# Patient Record
Sex: Female | Born: 1935 | Race: White | Hispanic: No | Marital: Single | Smoking: Former smoker
Health system: Southern US, Community
[De-identification: ages and names within clinical notes are randomized; demographics above are authoritative.]

## PROBLEM LIST (undated history)

## (undated) DIAGNOSIS — F419 Anxiety disorder, unspecified: Secondary | ICD-10-CM

## (undated) DIAGNOSIS — I639 Cerebral infarction, unspecified: Secondary | ICD-10-CM

## (undated) DIAGNOSIS — I8390 Asymptomatic varicose veins of unspecified lower extremity: Secondary | ICD-10-CM

## (undated) DIAGNOSIS — I252 Old myocardial infarction: Secondary | ICD-10-CM

## (undated) DIAGNOSIS — M810 Age-related osteoporosis without current pathological fracture: Secondary | ICD-10-CM

## (undated) DIAGNOSIS — K279 Peptic ulcer, site unspecified, unspecified as acute or chronic, without hemorrhage or perforation: Secondary | ICD-10-CM

## (undated) DIAGNOSIS — L03116 Cellulitis of left lower limb: Secondary | ICD-10-CM

## (undated) DIAGNOSIS — I83009 Varicose veins of unspecified lower extremity with ulcer of unspecified site: Secondary | ICD-10-CM

## (undated) DIAGNOSIS — I872 Venous insufficiency (chronic) (peripheral): Secondary | ICD-10-CM

## (undated) DIAGNOSIS — Z9289 Personal history of other medical treatment: Secondary | ICD-10-CM

## (undated) DIAGNOSIS — G8929 Other chronic pain: Secondary | ICD-10-CM

## (undated) DIAGNOSIS — K5790 Diverticulosis of intestine, part unspecified, without perforation or abscess without bleeding: Secondary | ICD-10-CM

## (undated) DIAGNOSIS — I739 Peripheral vascular disease, unspecified: Secondary | ICD-10-CM

## (undated) DIAGNOSIS — A0811 Acute gastroenteropathy due to Norwalk agent: Secondary | ICD-10-CM

## (undated) DIAGNOSIS — IMO0002 Reserved for concepts with insufficient information to code with codable children: Secondary | ICD-10-CM

## (undated) DIAGNOSIS — E785 Hyperlipidemia, unspecified: Secondary | ICD-10-CM

## (undated) DIAGNOSIS — D509 Iron deficiency anemia, unspecified: Secondary | ICD-10-CM

## (undated) DIAGNOSIS — I5189 Other ill-defined heart diseases: Secondary | ICD-10-CM

## (undated) DIAGNOSIS — L97909 Non-pressure chronic ulcer of unspecified part of unspecified lower leg with unspecified severity: Secondary | ICD-10-CM

## (undated) DIAGNOSIS — K589 Irritable bowel syndrome without diarrhea: Secondary | ICD-10-CM

## (undated) DIAGNOSIS — I509 Heart failure, unspecified: Secondary | ICD-10-CM

## (undated) DIAGNOSIS — K5712 Diverticulitis of small intestine without perforation or abscess without bleeding: Secondary | ICD-10-CM

## (undated) DIAGNOSIS — I824Z1 Acute embolism and thrombosis of unspecified deep veins of right distal lower extremity: Secondary | ICD-10-CM

## (undated) DIAGNOSIS — M549 Dorsalgia, unspecified: Secondary | ICD-10-CM

## (undated) DIAGNOSIS — F32A Depression, unspecified: Secondary | ICD-10-CM

## (undated) DIAGNOSIS — I10 Essential (primary) hypertension: Secondary | ICD-10-CM

## (undated) DIAGNOSIS — I2510 Atherosclerotic heart disease of native coronary artery without angina pectoris: Secondary | ICD-10-CM

## (undated) DIAGNOSIS — F329 Major depressive disorder, single episode, unspecified: Secondary | ICD-10-CM

## (undated) HISTORY — DX: Reserved for concepts with insufficient information to code with codable children: IMO0002

## (undated) HISTORY — DX: Asymptomatic varicose veins of unspecified lower extremity: I83.90

## (undated) HISTORY — PX: HAND SURGERY: SHX662

## (undated) HISTORY — DX: Hyperlipidemia, unspecified: E78.5

## (undated) HISTORY — DX: Acute embolism and thrombosis of unspecified deep veins of right distal lower extremity: I82.4Z1

## (undated) HISTORY — DX: Venous insufficiency (chronic) (peripheral): I87.2

## (undated) HISTORY — DX: Acute gastroenteropathy due to Norwalk agent: A08.11

## (undated) HISTORY — PX: CHOLECYSTECTOMY: SHX55

## (undated) HISTORY — DX: Heart failure, unspecified: I50.9

## (undated) HISTORY — PX: APPENDECTOMY: SHX54

## (undated) HISTORY — DX: Irritable bowel syndrome, unspecified: K58.9

## (undated) HISTORY — PX: OTHER SURGICAL HISTORY: SHX169

## (undated) HISTORY — DX: Iron deficiency anemia, unspecified: D50.9

## (undated) HISTORY — DX: Old myocardial infarction: I25.2

## (undated) HISTORY — DX: Anxiety disorder, unspecified: F41.9

## (undated) HISTORY — DX: Personal history of other medical treatment: Z92.89

## (undated) HISTORY — DX: Diverticulosis of intestine, part unspecified, without perforation or abscess without bleeding: K57.90

## (undated) HISTORY — DX: Varicose veins of unspecified lower extremity with ulcer of unspecified site: I83.009

## (undated) HISTORY — DX: Depression, unspecified: F32.A

## (undated) HISTORY — DX: Peptic ulcer, site unspecified, unspecified as acute or chronic, without hemorrhage or perforation: K27.9

## (undated) HISTORY — DX: Non-pressure chronic ulcer of unspecified part of unspecified lower leg with unspecified severity: L97.909

## (undated) HISTORY — DX: Peripheral vascular disease, unspecified: I73.9

## (undated) HISTORY — DX: Essential (primary) hypertension: I10

## (undated) HISTORY — DX: Major depressive disorder, single episode, unspecified: F32.9

## (undated) HISTORY — DX: Other ill-defined heart diseases: I51.89

## (undated) HISTORY — DX: Dorsalgia, unspecified: M54.9

## (undated) HISTORY — DX: Atherosclerotic heart disease of native coronary artery without angina pectoris: I25.10

## (undated) HISTORY — PX: TYMPANOPLASTY: SHX33

## (undated) HISTORY — DX: Other chronic pain: G89.29

## (undated) HISTORY — DX: Cellulitis of left lower limb: L03.116

---
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
## 1973-04-04 HISTORY — PX: ABDOMINAL HYSTERECTOMY: SHX81

## 1996-04-04 HISTORY — PX: CORONARY ARTERY BYPASS GRAFT: SHX141

## 1998-10-27 ENCOUNTER — Encounter: Admission: RE | Admit: 1998-10-27 | Discharge: 1998-11-02 | Payer: Self-pay | Admitting: Orthopedic Surgery

## 1998-11-03 ENCOUNTER — Encounter: Admission: RE | Admit: 1998-11-03 | Discharge: 1998-12-15 | Payer: Self-pay | Admitting: Orthopedic Surgery

## 2000-06-09 ENCOUNTER — Inpatient Hospital Stay (HOSPITAL_COMMUNITY): Admission: EM | Admit: 2000-06-09 | Discharge: 2000-06-13 | Payer: Self-pay | Admitting: Emergency Medicine

## 2000-06-12 ENCOUNTER — Encounter: Payer: Self-pay | Admitting: Endocrinology

## 2003-12-15 ENCOUNTER — Inpatient Hospital Stay (HOSPITAL_BASED_OUTPATIENT_CLINIC_OR_DEPARTMENT_OTHER): Admission: RE | Admit: 2003-12-15 | Discharge: 2003-12-15 | Payer: Self-pay | Admitting: Cardiovascular Disease

## 2007-04-05 HISTORY — PX: OTHER SURGICAL HISTORY: SHX169

## 2008-12-02 ENCOUNTER — Encounter: Admission: RE | Admit: 2008-12-02 | Discharge: 2008-12-02 | Payer: Self-pay | Admitting: Endocrinology

## 2009-01-14 ENCOUNTER — Encounter: Admission: RE | Admit: 2009-01-14 | Discharge: 2009-01-14 | Payer: Self-pay | Admitting: Orthopedic Surgery

## 2009-01-15 ENCOUNTER — Ambulatory Visit (HOSPITAL_BASED_OUTPATIENT_CLINIC_OR_DEPARTMENT_OTHER): Admission: RE | Admit: 2009-01-15 | Discharge: 2009-01-15 | Payer: Self-pay | Admitting: Orthopedic Surgery

## 2009-11-24 ENCOUNTER — Ambulatory Visit: Payer: Self-pay | Admitting: Cardiovascular Disease

## 2010-02-02 HISTORY — PX: COLONOSCOPY: SHX174

## 2010-02-09 ENCOUNTER — Ambulatory Visit: Payer: Self-pay | Admitting: Cardiovascular Disease

## 2010-03-15 ENCOUNTER — Encounter: Payer: Self-pay | Admitting: Cardiovascular Disease

## 2010-03-15 DIAGNOSIS — E669 Obesity, unspecified: Secondary | ICD-10-CM

## 2010-03-15 DIAGNOSIS — I10 Essential (primary) hypertension: Secondary | ICD-10-CM

## 2010-03-15 DIAGNOSIS — F419 Anxiety disorder, unspecified: Secondary | ICD-10-CM

## 2010-03-15 DIAGNOSIS — E1159 Type 2 diabetes mellitus with other circulatory complications: Secondary | ICD-10-CM | POA: Insufficient documentation

## 2010-03-15 DIAGNOSIS — F411 Generalized anxiety disorder: Secondary | ICD-10-CM | POA: Insufficient documentation

## 2010-03-15 DIAGNOSIS — E66811 Obesity, class 1: Secondary | ICD-10-CM | POA: Insufficient documentation

## 2010-05-11 ENCOUNTER — Other Ambulatory Visit: Payer: Self-pay | Admitting: Endocrinology

## 2010-05-11 DIAGNOSIS — R269 Unspecified abnormalities of gait and mobility: Secondary | ICD-10-CM

## 2010-05-11 DIAGNOSIS — H538 Other visual disturbances: Secondary | ICD-10-CM

## 2010-05-11 DIAGNOSIS — R928 Other abnormal and inconclusive findings on diagnostic imaging of breast: Secondary | ICD-10-CM

## 2010-05-11 DIAGNOSIS — R531 Weakness: Secondary | ICD-10-CM

## 2010-05-13 ENCOUNTER — Ambulatory Visit
Admission: RE | Admit: 2010-05-13 | Discharge: 2010-05-13 | Disposition: A | Discharge status: Home / Self Care | Payer: Medicare Other | Source: Ambulatory Visit | Attending: Endocrinology | Admitting: Endocrinology

## 2010-05-13 DIAGNOSIS — R531 Weakness: Secondary | ICD-10-CM

## 2010-05-13 DIAGNOSIS — R269 Unspecified abnormalities of gait and mobility: Secondary | ICD-10-CM

## 2010-05-13 DIAGNOSIS — H538 Other visual disturbances: Secondary | ICD-10-CM

## 2010-05-21 ENCOUNTER — Ambulatory Visit
Admission: RE | Admit: 2010-05-21 | Discharge: 2010-05-21 | Disposition: A | Discharge status: Home / Self Care | Payer: Medicare Other | Source: Ambulatory Visit | Attending: Endocrinology | Admitting: Endocrinology

## 2010-05-21 DIAGNOSIS — R928 Other abnormal and inconclusive findings on diagnostic imaging of breast: Secondary | ICD-10-CM

## 2010-06-01 ENCOUNTER — Ambulatory Visit: Payer: Self-pay | Admitting: Cardiovascular Disease

## 2010-07-08 LAB — BASIC METABOLIC PANEL
BUN: 25 mg/dL — ABNORMAL HIGH (ref 6–23)
GFR calc Af Amer: >60 mL/min (ref >60)
GFR calc non Af Amer: 53 mL/min — ABNORMAL LOW (ref >60)
Potassium: 5.2 mEq/L — ABNORMAL HIGH (ref 3.5–5.1)
Sodium: 136 mEq/L (ref 135–145)

## 2010-07-08 LAB — GLUCOSE, CAPILLARY: Glucose-Capillary: 116 mg/dL — ABNORMAL HIGH (ref 70–99)

## 2010-08-20 NOTE — Cardiovascular Report (Signed)
[name], [name]                        ACCOUNT NO.:  [id number]   MEDICAL RECORD NO.:  [id number]                   PATIENT TYPE:  OIB   LOCATION:  6501                                 FACILITY:  [facility]   PHYSICIAN:  Thayer Headings, M.D.              DATE OF BIRTH:  09/02/1927   DATE OF PROCEDURE:  12/15/2003  DATE OF DISCHARGE:                              CARDIAC CATHETERIZATION   [name] is a 77 year old female.  She has a history of coronary artery  bypass grafting approximately 9 years ago.  She recently has had some  episodes of chest pain and shortness of breath.  She had a stress Cardiolite  study which revealed anterior apical attenuation.  She was referred for  heart catheterization for further evaluation.   PROCEDURE:  Left heart catheterization with coronary angiography.   The right femoral artery was easily cannulated using the modified Seldinger  technique.   HEMODYNAMICS:  The left ventricular pressure was 149/11 with an aortic  pressure of 149/66.   CORONARY ANGIOGRAPHY:  1.  Left main coronary artery has mild irregularities.  The proximal LAD has      moderate irregularities in the proximal segment between 40 and 50%.  It      gives off a moderate to large first diagonal branch and is then      subtotally occluded.  There is a very slow flow going down to the distal      LAD and competitive flow can be seen from the IMA.  2.  The left circumflex artery is a moderate size vessel.  The first obtuse      marginal artery is occluded.  The circumflex artery terminates as a      moderate size posterior lateral branch.  3.  The right coronary artery is large and dominant.  There is a proximal      50% stenosis.  In the mid segment there are diffuse irregularities,      between 20 and 30% stenosis.  The distal RCA has a 90% stenosis.      Competitive flow can be seen from the saphenous vein graft.  The distal      right coronary artery/posterior descending  artery is unremarkable.  4.  The saphenous vein graft to the right coronary artery is fairly smooth      and normal.  The anastomosis is normal.  The posterior descending artery      can be seen well during this shot into this seen to be normal.  5.  The saphenous vein graft to the first obtuse marginal artery is normal.      The anastomosis is normal.  The obtuse marginal branch is normal.  6.  The left internal mammary artery is fairly large and normal.  It fills      the LAD is a normal fashion.  There is no significant stenoses in the  mid and distal LAD.   LEFT VENTRICULOGRAM:  Left ventriculogram was performed in a 30-RAO  position.  It reveals overall well preserved left ventricular systolic  function with an ejection fraction of 60-65%.   COMPLICATIONS:  None.   CONCLUSIONS:  1.  Severe native coronary artery disease, but with patent grafts and      internal mammary artery.  2.  Normal left ventricular systolic function.   It appears that her symptoms of chest pain and shortness of breath are not  related to coronary artery disease.  We will continue to have her look for  other causes.  She may need to check in with her medical doctor.                                               Thayer Headings, M.D.    PJN/MEDQ  D:  12/15/2003  T:  12/15/2003  Job:  [id number]   cc:   Parke Poisson. Electa Sniff, M.D.  [id number] [address], [address] [place]  [place] [address]  Fax: [phone]

## 2010-08-20 NOTE — H&P (Signed)
[place]  Patient:    [name], [name]                    MRN: [id number] Adm. Date:  06/09/00 Attending:  Parke Poisson. Electa Sniff, M.D.                         History and Physical  CHIEF COMPLAINT:  This is a 77 year old woman, who complains of shortness of breath.  HISTORY OF PRESENT ILLNESS:  The patient has had a cough for a period of approximately three to five days prior to this admission.  She has become progressively more short of breath and presents to the office with symptoms of severe dyspnea and also anxiety.  She is markedly distraught with being so dyspneic.  There is no history of chest pain.  She has not been coughing up anything productive.  She does have a history of coronary artery disease and had a coronary artery bypass graft done in August of 1996 and denies any chest pain at the present time.  She has not had any fever or chills.  She had an electrocardiogram in the office which is negative and a chest x-ray which did not show any changes.  She has a history of diabetes mellitus, type 2 which has been present since 1991 and she is currently receiving Actos for this and has been under good control recently.  She has a history of degenerative joint disease, hypertension and a history of dyslipidemia.  PAST MEDICAL HISTORY:  She has had a hospitalization in the past for hysterectomy and bilateral salpingo-oophorectomy.  In 1978 she had [facility] spotted fever.  In 1979 and 1989 and again in 1989, she was admitted to the hospital for diverticulitis.  In September of 1989 she was admitted to the hospital for gastroenteritis.  She was hospitalization in 1996 and at that time she had her coronary artery bypass graft.  MEDICATIONS PRIOR TO ADMISSION:  1. Xanax 1 mg t.i.d.  2. Desyrel 150 mg h.s.  3. Estratab 0.625 mg q.d.  4. Prilosec 40 mg q.d.  5. Tenormin 25 mg q.d.  6. Enteric-coated aspirin 81 mg q.d.  7.  Pravachol 20 mg h.s.  8. Actos 45 mg q.d.  9. Altace 10 mg q.d. 10. Antivert p.r.n.  FAMILY HISTORY:  Her mother died in 65 of a stroke.  Father died in 45 of diabetes and arthritis.  She has one brother and one sister who has diabetes. She is divorced and has four children.  PERSONAL HISTORY:  She does not smoke or drink excessive amounts of alcohol.  ALLERGIES:  She is allergic to VOLTAREN which causes possible abnormal liver function studies.  REVIEW OF SYSTEMS:  She has had no history of weight loss. CARDIOVASCULAR/RESPIRATORY:  See above.  GASTROINTESTINAL:  She has a history of multiple problems with her colon with irritable colon syndrome but no recent symptoms at this time.  GENITOURINARY:  No complaints.  PHYSICAL EXAMINATION:  GENERAL:  This is a well-developed woman, who appears distraught, anxious and is markedly dyspneic.  SKIN:  Warm.  She has marked facial flushing.  HEAD:  Her head is normocephalic.  NECK:  Supple.  LUNGS:  Reveal diffuse wheezing to be present bilaterally.  Rales are present bilaterally.  CARDIOVASCULAR:  Rhythm is regular.  BREASTS:  No masses are present.  ABDOMEN:  Soft.  No masses are present.  EXTREMITIES:  No edema  is present.  NEUROMUSCULAR:  Essentially normal.  IMPRESSION: 1. Acute bronchitis with severe bronchospasm and dyspnea. 2. History of atherosclerotic heart disease as manifested by previous    coronary artery bypass graft, possible congestive heart failure. 3. Diabetes mellitus, type 2. 4. History of irritable colon syndrome. 5. Dyslipidemia. DD:  06/09/00 TD:  06/11/00 Job: [id number]

## 2010-08-20 NOTE — Discharge Summary (Signed)
[place]  Patient:    [name], [name]                     MRN: [id number] Adm. Date:  [id number] Disc. Date: [id number] Attending:  Sherrlyn Hock                           Discharge Summary  HISTORY:  This is a 77 year old woman who presents with a history of dyspnea. She has had a cough for approximately three to five days prior to this admission; and, she presents to the office very distraught and markedly symptomatic with reference to dyspnea.  She has had a persistent cough and is markedly stressed.  She does have a history of coronary artery disease and had a bypass graft done in 1996; however, she denies any chest pain at this time. In the office she had an x-ray, which did not show any changes.  No evidence of heart failure is present; however, some mild pulmonary hypertension changes are present.  She has a history of diabetes mellitus, which has been controlled with Actos and also has a history of hypertension, degenerative joint disease and dyslipidemia.  PHYSICAL EXAMINATION:  GENERAL APPEARANCE:  Reveals a well-developed woman who appears markedly anxious and in distress.  LUNGS:  Reveal wheezes to be present bilaterally.  HEART:  Cardiovascular rhythm is regular.  ABDOMEN:  Soft.  EXTREMITIES:  Do not show any pedal edema.  The impression on admission is: 1. Acute bronchitis with severe bronchospasm and dyspnea. 2. History of arteriosclerotic heard disease as manifested by coronary artery    bypass graft. 3. Diabetes mellitus Type II. 4. History of irritable colon syndrome. 5. Dyslipidemia.  HOSPITAL COURSE:  The patient was admitted to the hospital and arterial gases were obtained.  Her pO2 was 65 and her pCO2 was 45.  A CBC was done prior to her admission and this did not show an elevated white count.  An electrocardiogram was also done prior to the admission, which did not show any significant  changes.  She was slow to improve.  She continued to have moderate wheezes to be present.  However, clinically her objective findings were not as significant as her subjective findings.  She gradually improved.  Repeat chest x-ray was done, which did not show any significant changes.  Her glucose was controlled.  She was switched from IV medications to oral medications and was started on Flovent.  She still feels weak at this time and is anxious.  However, examination of her lungs reveal her wheezing to be markedly improved.  There is some mild dyspnea present.  However, objectively the patient is significantly better.  A cardiac echo is pending and that will be done either as an outpatient or done prior to her departure from the hospital.  IMPRESSION ON DISCHARGE: 1. Acute bronchitis with severe bronchospasm. 2. Diabetes mellitus Type II. 3. Arteriosclerotic heart disease. 4. Dyslipidemia. 5. Hypertension.  MEDICATIONS ON DISCHARGE:  Xanax 0.5 mg t.i.d. p.r.n., Desyrel 150 mg H.S., Estratab 0.625 mg q.d., Prilosec 40 mg q.d., Tenormin 25 mg q.d., enteric coated aspirin 81 mg q.d., Pravachol 20 mg q.d., Actos 45 mg q.d., Altace 20 mg q.d., and added to her program is Tequin 400 mg p.o. q.d., Flovent 44 2 puffs in the morning and 2 in the evening, Ventolin 2 puffs q.i.d., and Maxzide 25 1 q.a.m.  DIET ON DISCHARGE:  Diabetic  diet.  ACTIVITIES ON DISCHARGE:  She was advised to to get rest.  PLANS FOR FOLLOW-UP:  She will be seen in the office on Monday.  CONDITION ON DISCHARGE:  Improved. DD:  06/13/00 TD:  06/14/00 Job: [id number]

## 2010-08-20 NOTE — H&P (Signed)
NAME:  [name], [name]                        ACCOUNT NO.:  [id number]   MEDICAL RECORD NO.:  [id number]                   PATIENT TYPE:  AMB   LOCATION:                                       FACILITY:  [facility]   PHYSICIAN:  Thayer Headings, M.D.              DATE OF BIRTH:  [birth year]   DATE OF ADMISSION:  12/15/2003  DATE OF DISCHARGE:                                HISTORY & PHYSICAL   HISTORY OF PRESENT ILLNESS:  The patient is an elderly female with a history  of coronary artery disease. She has had some progressive shortness of breath  and chest discomfort over the past several months.  She recently had a  stress Cardiolite study which had some anteroapical abnormalities.  There  appeared to be reversible defects.  I could not exclude breast attenuation.  Because of her symptoms, she is referred for a heart catheterization.  She  continues to have dyspnea on exertion and some chest discomfort, especially  when she gets in a hurry.  She has a history of coronary artery disease, and  is status post coronary artery bypass graft surgery approximately nine years  ago.   CURRENT MEDICATIONS:  1.  Estrace once daily.  2.  Xanax t.i.d.  3.  Desyrel 150 mg daily.  4.  Prilosec 20 mg daily.  5.  Atenolol 25 mg daily.  6.  Pravachol once daily.  7.  Enteric-coated aspirin 81 mg daily.  8.  Altace 5 mg daily.  9.  Nitroglycerin p.r.n.   ALLERGIES:  No known drug allergies.   PAST MEDICAL HISTORY:  1.  Hypercholesterolemia.  2.  Hypertension.  3.  Coronary artery disease, status post coronary artery bypass graft      surgery.  4.  Peptic ulcer disease.   SOCIAL HISTORY:  The patient does not smoke and does not drink.  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   FAMILY HISTORY:  Unremarkable.   REVIEW OF SYSTEMS:  Was reviewed and is essentially negative.   PHYSICAL EXAMINATION:  GENERAL:  She is an elderly female in no acute  distress.  Weight 166 pounds, which is  down 2 pounds from her previous  visit.  VITAL SIGNS:  Blood pressure 168/90, heart rate 60.  HEENT/NECK:  Reveals 2+ carotids.  She has no bruits.  There is no jugular  venous distention.  No thyromegaly.  LUNGS:  Clear to auscultation.  HEART:  A regular rate.  S1, S2 with no murmurs, gallops, or rubs.  ABDOMEN:  Good bowel sounds and nontender.  EXTREMITIES:  She has no clubbing, cyanosis or edema.  NEUROLOGIC:  Nonfocal.   IMPRESSION:  The patient presents with persistent episodes of chest pain and  shortness of breath.   PLAN:  I have recommended that we proceed with a heart catheterization.  We  have discussed the risks, benefits and options of  heart catheterization.  She understands and agrees to proceed.                                                Thayer Headings, M.D.    PJN/MEDQ  D:  12/11/2003  T:  12/11/2003  Job:  [id number]   cc:   Parke Poisson. Electa Sniff, M.D.  [id number] [address] [place]  [place] [address]  Fax: [phone]

## 2010-09-06 ENCOUNTER — Ambulatory Visit (INDEPENDENT_AMBULATORY_CARE_PROVIDER_SITE_OTHER): Payer: Medicare Other | Admitting: Family Medicine

## 2010-09-06 ENCOUNTER — Encounter: Payer: Self-pay | Admitting: Family Medicine

## 2010-09-06 ENCOUNTER — Other Ambulatory Visit: Payer: Self-pay | Admitting: *Deleted

## 2010-09-06 DIAGNOSIS — E1169 Type 2 diabetes mellitus with other specified complication: Secondary | ICD-10-CM | POA: Insufficient documentation

## 2010-09-06 DIAGNOSIS — D509 Iron deficiency anemia, unspecified: Secondary | ICD-10-CM | POA: Insufficient documentation

## 2010-09-06 DIAGNOSIS — E785 Hyperlipidemia, unspecified: Secondary | ICD-10-CM | POA: Insufficient documentation

## 2010-09-06 DIAGNOSIS — H6120 Impacted cerumen, unspecified ear: Secondary | ICD-10-CM

## 2010-09-06 DIAGNOSIS — D649 Anemia, unspecified: Secondary | ICD-10-CM

## 2010-09-06 DIAGNOSIS — E119 Type 2 diabetes mellitus without complications: Secondary | ICD-10-CM

## 2010-09-06 DIAGNOSIS — I2510 Atherosclerotic heart disease of native coronary artery without angina pectoris: Secondary | ICD-10-CM

## 2010-09-06 DIAGNOSIS — I10 Essential (primary) hypertension: Secondary | ICD-10-CM

## 2010-09-06 DIAGNOSIS — Z23 Encounter for immunization: Secondary | ICD-10-CM

## 2010-09-06 DIAGNOSIS — E1165 Type 2 diabetes mellitus with hyperglycemia: Secondary | ICD-10-CM | POA: Insufficient documentation

## 2010-09-06 MED ORDER — PRAVASTATIN SODIUM 20 MG PO TABS: 20.0000 mg | ORAL_TABLET | Freq: Every day | ORAL | Status: DC

## 2010-09-06 MED ORDER — ESOMEPRAZOLE MAGNESIUM 40 MG PO CPDR: 40.0000 mg | DELAYED_RELEASE_CAPSULE | Freq: Every day | ORAL | Status: DC

## 2010-09-06 MED ORDER — ACETIC ACID 2 % OT SOLN: 4.0000 [drp] | Freq: Three times a day (TID) | OTIC | Status: DC

## 2010-09-06 NOTE — Assessment & Plan Note (Signed)
Unchanged. Refilled pravastatin. Awaiting records, per pt just had labs drawn.

## 2010-09-06 NOTE — Assessment & Plan Note (Signed)
Stable. Continue current meds.   

## 2010-09-06 NOTE — Patient Instructions (Addendum)
Great to meet you. As soon as I get your records, I will let you know.

## 2010-09-06 NOTE — Progress Notes (Signed)
77 yo female here to establish care.  DM- followed by Endocrine, Dr. Dwyane Dee. Per pt, controlled on Actos 45 mg daily, awaiting records. Denies any episodes of hypoglycemia.     HTN- well controlled on Maxzide 25 daily. Denies any CP, SOB or LE edema.    CAD- followed by Dr. Johnsie Cancel. S/p CABG8/199, Denies CP. Last echo in 2002 showed EF of 65%.  Right ear pain- feels full.  No drainage, fevers, or URI symptoms.    The PMH, PSH, Social History, Family History, Medications, and allergies have been reviewed in [facility], and have been updated if relevant.  ROS: See HPI  PHYSICAL EXAMINATION:  BP 118/64  Pulse 90  Temp(Src) 97.9 F (36.6 C) (Oral)  Ht 4' 9.5" (1.461 m)  Wt 156 lb 12.8 oz (71.124 kg)  BMI 33.34 kg/m2  GENERAL APPEARANCE: Reveals a well-developed woman who appears markedly  anxious and in distress. HEENT:  Cerumen impaction bilaterally, unable to remove with irrigation LUNGS:CTA bilaterally HEART: Cardiovascular rhythm is regular.  ABDOMEN: Soft.  EXTREMITIES: Do not show any pedal edema.

## 2010-09-06 NOTE — Assessment & Plan Note (Signed)
New.  Attempted irrigation, able to remove small chunks but not large amounts of wax bilaterally. Recommended OTC Debrox solution.

## 2010-09-13 ENCOUNTER — Telehealth: Payer: Self-pay | Admitting: *Deleted

## 2010-09-13 NOTE — Telephone Encounter (Signed)
Patient was seen last week and she says that the drops that she is using for her ear isn't helping at all. She is asking what to do at this point. Please advise. Uses [facility].

## 2010-09-13 NOTE — Telephone Encounter (Signed)
Patient notified, appt scheduled.

## 2010-09-13 NOTE — Telephone Encounter (Signed)
The only other possibility would to have her come back in again to try to remove her wax in office. Maybe drops have softened the wax a Rister to make it easier to do in office.

## 2010-09-16 ENCOUNTER — Ambulatory Visit (INDEPENDENT_AMBULATORY_CARE_PROVIDER_SITE_OTHER): Payer: Medicare Other | Admitting: Family Medicine

## 2010-09-16 ENCOUNTER — Encounter: Payer: Self-pay | Admitting: Family Medicine

## 2010-09-16 VITALS — Ht <= 58 in

## 2010-09-16 DIAGNOSIS — H6120 Impacted cerumen, unspecified ear: Secondary | ICD-10-CM

## 2010-09-16 NOTE — Progress Notes (Signed)
  Subjective:    [name], female    DOB: 02-23-1928, 77 y.o.   MRN: [id number]  HPI Here for cerumen impaction. Tried debrox OTC drops with no improvement.   Review of Systems     Objective:   Physical Exam        Assessment & Plan:  Cerumen removed with irrigation and curette. Tolerated procedure well.

## 2010-09-21 ENCOUNTER — Ambulatory Visit (INDEPENDENT_AMBULATORY_CARE_PROVIDER_SITE_OTHER): Payer: Medicare Other | Admitting: Family Medicine

## 2010-09-21 ENCOUNTER — Ambulatory Visit: Payer: Medicare Other | Admitting: Family Medicine

## 2010-09-21 ENCOUNTER — Encounter: Payer: Self-pay | Admitting: Family Medicine

## 2010-09-21 VITALS — BP 120/80 | HR 90 | Temp 98.3°F | Wt 156.0 lb

## 2010-09-21 DIAGNOSIS — H6120 Impacted cerumen, unspecified ear: Secondary | ICD-10-CM

## 2010-09-21 NOTE — Progress Notes (Signed)
Subjective:    [name], female    DOB: [birth year], 77 y.o.   MRN: [id number]  HPI CC: check R ear.  Pleasant 77yo new to me with complaint of R cerumen impaction.  3rd time here.  Ear has been bothering her for several weeks now.  Some pain.  Denies drainage/discharge, tinnitus.  Endorses slight sinus congestion and RN.  Some rhinorrhea for a while now.  Denies fevers/chills, popping of ear.  6/4 attempted cerumen disimpaction, unable to.  Recommended debrox.  Ear flushed out on Thursday 09/16/10, got some ear wax out.  Still feels strange, some pain and stopped up behind ear.  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 No prior problems with right ear.  Patient Active Problem List  Diagnoses  . Coronary artery disease  . Anxiety  . Obesity  . Hypertension  . Diastolic dysfunction  . Anemia  . Diabetes mellitus  . Hyperlipidemia  . Cerumen impaction   Past Medical History  Diagnosis Date  . Hypertension   . Coronary artery disease   . Anxiety   . Diastolic dysfunction   . Hyperlipidemia   . Peptic ulcer disease   . Anemia   . Depression    Past Surgical History  Procedure Date  . Vaginal hysterectomy   . Tympanoplasty 1960s    R side  . Coronary artery bypass graft 1998   History  Substance Use Topics  . Smoking status: Never Smoker   . Smokeless tobacco: Not on file  . Alcohol Use: Not on file   Family History  Problem Relation Age of Onset  . Hypertension Mother   . Stroke Mother    No Known Allergies Current Outpatient Prescriptions on File Prior to Visit  Medication Sig Dispense Refill  . ALPRAZolam (XANAX) 1 MG tablet Take 1 mg by mouth 3 (three) times daily as needed.        Marland Kitchen aspirin 81 MG tablet Take 81 mg by mouth daily.        Marland Kitchen atenolol (TENORMIN) 25 MG tablet Take 12.5 mg by mouth daily.        Marland Kitchen esomeprazole (NEXIUM) 40 MG capsule Take 1 capsule (40 mg total) by mouth daily.  30  capsule  3  . estradiol (ESTRACE) 0.5 MG tablet Take by mouth daily. DOSE UNKNOWN       . glucosamine-chondroitin 500-400 MG tablet Take 1 tablet by mouth daily.        Marland Kitchen HYDROcodone-acetaminophen (VICODIN) 5-500 MG per tablet Take 1/2 tablet by mouth four times daily as needed       . metoCLOPramide (REGLAN) 10 MG tablet Take 10 mg by mouth 3 (three) times daily as needed.        . pioglitazone (ACTOS) 45 MG tablet Take 45 mg by mouth daily.        . pravastatin (PRAVACHOL) 20 MG tablet Take 1 tablet (20 mg total) by mouth daily.  30 tablet  3  . ramipril (ALTACE) 5 MG tablet Take 5 mg by mouth daily.       . traZODone (DESYREL) 150 MG tablet Take 150 mg by mouth at bedtime.        . triamterene-hydrochlorothiazide (DYAZIDE) 37.5-25 MG per capsule Take 1 capsule by mouth every morning. DOSE UNKNOWN        Review of Systems Per HPI    Objective:   Physical Exam  Nursing note and  vitals reviewed. Constitutional: She appears well-developed and well-nourished. No distress.  HENT:  Head: Normocephalic and atraumatic.  Right Ear: External ear normal.  Left Ear: External ear and ear canal normal.  Mouth/Throat: Oropharynx is clear and moist. No oropharyngeal exudate.       L ear: canal clear, TM with some chronic changes, ? old chronic perf R ear: no pain mastoid, no pain tragus, no pain pinna, canal with cerumen impaction, unable to visualize ear drum.  Irrigation performed, able to remove large chunk cerumen, visualized TM and erythematous, retracted.  No perf visualized.  Eyes: Conjunctivae and EOM are normal. Pupils are equal, round, and reactive to light. No scleral icterus.  Neck: Normal range of motion. Neck supple.  Lymphadenopathy:    She has no cervical adenopathy.          Assessment & Plan:

## 2010-09-21 NOTE — Assessment & Plan Note (Addendum)
Attempted ear irrigation again today.  3rd time charm.  Good results. Advised to call us if red flags concerning for infection or muffled hearing not improving.

## 2010-09-21 NOTE — Patient Instructions (Addendum)
We removed cerumen today.  You may feel dizzy and muffled hearing, but that should improve.  If not, let us know.  Also let us know if worsening ear pain or any draining from ear. Call us with questions. Cerumen Impaction A cerumen impaction is when the wax in your ear forms a plug. This plug usually causes reduced hearing. Sometimes it also causes an earache or dizziness. Removing a cerumen impaction can be difficult and painful. The wax sticks to the ear canal. The canal is sensitive and bleeds easily. If you try to remove a heavy wax buildup with a cotton tipped swab, you may push it in further. Irrigation with water, suction, and small ear curettes may be used to clear out the wax. If the impaction is fixed to the skin in the ear canal, ear drops may be needed for a few days to loosen the wax. People who build up a lot of wax frequently can use ear wax removal products available in your local drugstore. SEEK MEDICAL CARE IF:  You develop an earache, increased hearing loss, or marked dizziness.  Document Released: 04/28/2004 Document Re-Released: 09/08/2009 [facility] Patient Information 2011 [place].

## 2010-09-24 ENCOUNTER — Other Ambulatory Visit: Payer: Self-pay | Admitting: *Deleted

## 2010-09-24 MED ORDER — TRIAMTERENE-HCTZ 37.5-25 MG PO CAPS: 1.0000 | ORAL_CAPSULE | ORAL | Status: DC

## 2010-09-24 MED ORDER — HYDROCODONE-ACETAMINOPHEN 5-500 MG PO TABS: 0.5000 | ORAL_TABLET | Freq: Four times a day (QID) | ORAL | Status: DC | PRN

## 2010-09-24 NOTE — Telephone Encounter (Signed)
Please phone in

## 2010-09-27 ENCOUNTER — Other Ambulatory Visit: Payer: Self-pay | Admitting: *Deleted

## 2010-09-27 ENCOUNTER — Ambulatory Visit (INDEPENDENT_AMBULATORY_CARE_PROVIDER_SITE_OTHER): Payer: Medicare Other | Admitting: Gynecology

## 2010-09-27 DIAGNOSIS — B373 Candidiasis of vulva and vagina: Secondary | ICD-10-CM

## 2010-09-27 DIAGNOSIS — N898 Other specified noninflammatory disorders of vagina: Secondary | ICD-10-CM

## 2010-09-27 DIAGNOSIS — B3731 Acute candidiasis of vulva and vagina: Secondary | ICD-10-CM

## 2010-09-27 DIAGNOSIS — N368 Other specified disorders of urethra: Secondary | ICD-10-CM

## 2010-09-27 DIAGNOSIS — N3941 Urge incontinence: Secondary | ICD-10-CM

## 2010-09-27 MED ORDER — TRIAMTERENE-HCTZ 37.5-25 MG PO CAPS: 1.0000 | ORAL_CAPSULE | ORAL | Status: DC

## 2010-09-27 NOTE — Telephone Encounter (Signed)
Vicodin called to [place].

## 2010-11-02 ENCOUNTER — Other Ambulatory Visit: Payer: Self-pay | Admitting: *Deleted

## 2010-11-02 NOTE — Telephone Encounter (Addendum)
Is this pt mine? Or Dr. Elonda Husky?

## 2010-11-02 NOTE — Telephone Encounter (Signed)
This is not needed until tomorrow, so no rush on the refill.

## 2010-11-03 MED ORDER — HYDROCODONE-ACETAMINOPHEN 5-500 MG PO TABS: 0.5000 | ORAL_TABLET | Freq: Four times a day (QID) | ORAL | Status: DC | PRN

## 2010-11-03 NOTE — Telephone Encounter (Signed)
She's yours. She changed providers after her visit with you.

## 2010-11-03 NOTE — Telephone Encounter (Signed)
Rx called in as directed.   

## 2010-11-23 ENCOUNTER — Telehealth: Payer: Self-pay | Admitting: *Deleted

## 2010-11-23 DIAGNOSIS — B373 Candidiasis of vulva and vagina: Secondary | ICD-10-CM

## 2010-11-23 MED ORDER — NYSTATIN-TRIAMCINOLONE 100000-0.1 UNIT/GM-% EX CREA: TOPICAL_CREAM | Freq: Every day | CUTANEOUS | Status: DC

## 2010-11-23 NOTE — Telephone Encounter (Signed)
Done.  60 gram tube, 2 refills

## 2010-11-23 NOTE — Telephone Encounter (Signed)
PHARMACY FAXED OVER A REQUEST FOR PATIENT.PLEASE SEE HARD COPY CHART. PLEASE ADVISE

## 2010-12-17 ENCOUNTER — Other Ambulatory Visit: Payer: Self-pay | Admitting: *Deleted

## 2010-12-17 MED ORDER — HYDROCODONE-ACETAMINOPHEN 5-500 MG PO TABS: 0.5000 | ORAL_TABLET | Freq: Four times a day (QID) | ORAL | Status: DC | PRN

## 2010-12-17 NOTE — Telephone Encounter (Signed)
Rx called in as directed.   

## 2010-12-17 NOTE — Telephone Encounter (Signed)
Ok to refill.  plz phone in  

## 2010-12-20 ENCOUNTER — Encounter: Payer: Self-pay | Admitting: Family Medicine

## 2010-12-20 ENCOUNTER — Ambulatory Visit: Payer: Medicare Other | Admitting: Family Medicine

## 2010-12-20 ENCOUNTER — Ambulatory Visit (INDEPENDENT_AMBULATORY_CARE_PROVIDER_SITE_OTHER): Payer: Medicare Other | Admitting: Family Medicine

## 2010-12-20 VITALS — BP 144/68 | HR 95 | Temp 98.2°F | Wt 156.1 lb

## 2010-12-20 DIAGNOSIS — I10 Essential (primary) hypertension: Secondary | ICD-10-CM

## 2010-12-20 DIAGNOSIS — E119 Type 2 diabetes mellitus without complications: Secondary | ICD-10-CM

## 2010-12-20 DIAGNOSIS — R21 Rash and other nonspecific skin eruption: Secondary | ICD-10-CM

## 2010-12-20 DIAGNOSIS — I2510 Atherosclerotic heart disease of native coronary artery without angina pectoris: Secondary | ICD-10-CM

## 2010-12-20 DIAGNOSIS — Z23 Encounter for immunization: Secondary | ICD-10-CM

## 2010-12-20 DIAGNOSIS — Z0000 Encounter for general adult medical examination without abnormal findings: Secondary | ICD-10-CM

## 2010-12-20 NOTE — Assessment & Plan Note (Signed)
Stable, denies chest pain.

## 2010-12-20 NOTE — Assessment & Plan Note (Signed)
Good control per pt. Still awaiting records from Dr. Dwyane Dee.

## 2010-12-20 NOTE — Assessment & Plan Note (Signed)
Stopped atenolol.  She will check with cards. Today bp elevated. May recommend restart if staying elevated.  Return 3 mo for fu.

## 2010-12-20 NOTE — Progress Notes (Signed)
  Subjective:    [name], female    DOB: 13-Aug-1927, 77 y.o.   MRN: [id number]  HPI CC: 37mo f/u   Worried about spot on L cheek - no pain - noticed 4 days ago, puffy and red.  Uses moisturizer, same one for 29 years.  DM- followed by Endocrine, Dr. Dwyane Dee.  When checks in afternoon, 77-92.  Per pt, controlled on Actos 45 mg daily.  Denies any episodes of hypoglycemia.  HTN- well controlled on Maxzide 25 and ramipril 5mg  daily.  Thinks taken off atenolol, will check with Dr. Acie Fredrickson when goes to upcoming appt.  Denies any CP, or LE edema.  No HA, vision changes.  CAD- followed by Dr. Johnsie Cancel.  s/p CABG 11/1997.  Denies CP.  Last echo in 2002 showed EF of 65%.  Has varicose veins, unable to wear stockings because trouble putting them on.  Endorses increased stress - taking care of boyfriend (checks his sugars) as well as work stress.  Uses walker, handicapped.  Still does ADLs at home, vacuum, dishwashing, laundry, using leaf blower.  No refills needed currently.  Requests flu shot today.  Review of Systems Per HPI    Objective:   Physical Exam  Nursing note and vitals reviewed. Constitutional: She appears well-developed and well-nourished. No distress.  HENT:  Head: Normocephalic and atraumatic.  Mouth/Throat: Oropharynx is clear and moist. No oropharyngeal exudate.  Eyes: Conjunctivae and EOM are normal. Pupils are equal, round, and reactive to light. No scleral icterus.  Neck: Normal range of motion. Neck supple. No thyromegaly present.  Cardiovascular: Normal rate, regular rhythm, normal heart sounds and intact distal pulses.   No murmur heard. Pulmonary/Chest: Effort normal and breath sounds normal. No respiratory distress. She has no wheezes. She has no rales.  Musculoskeletal: She exhibits no edema.  Lymphadenopathy:    She has no cervical adenopathy.  Skin: Skin is warm and dry. No rash noted.       Slight erythema with some rough scale left cheek, 2 small  papules inferior to initial rash  Psychiatric: She has a normal mood and affect.          Assessment & Plan:

## 2010-12-20 NOTE — Patient Instructions (Addendum)
Good to see you today, call us with questions.  Return to see [place] after you see Dr. Dwyane Dee. Return at your convenience for a medicare wellness visit to go over preventative medicine things, return prior for fasting blood work.

## 2010-12-20 NOTE — Assessment & Plan Note (Signed)
?   AK vs sebaceous hyperplasia. Some erythema of nasal bridge, ?rosacea rec moisturizer. If not improved, consider derm referral.

## 2011-01-06 ENCOUNTER — Other Ambulatory Visit: Payer: Self-pay | Admitting: Family Medicine

## 2011-01-06 DIAGNOSIS — I10 Essential (primary) hypertension: Secondary | ICD-10-CM

## 2011-01-06 DIAGNOSIS — E785 Hyperlipidemia, unspecified: Secondary | ICD-10-CM

## 2011-01-06 DIAGNOSIS — I2510 Atherosclerotic heart disease of native coronary artery without angina pectoris: Secondary | ICD-10-CM

## 2011-01-10 ENCOUNTER — Other Ambulatory Visit (INDEPENDENT_AMBULATORY_CARE_PROVIDER_SITE_OTHER): Payer: Medicare Other

## 2011-01-10 DIAGNOSIS — E785 Hyperlipidemia, unspecified: Secondary | ICD-10-CM

## 2011-01-10 DIAGNOSIS — I10 Essential (primary) hypertension: Secondary | ICD-10-CM

## 2011-01-10 DIAGNOSIS — E119 Type 2 diabetes mellitus without complications: Secondary | ICD-10-CM

## 2011-01-10 LAB — COMPREHENSIVE METABOLIC PANEL
ALT: 27 U/L (ref 0–35)
AST: 27 U/L (ref 0–37)
Albumin: 4 g/dL (ref 3.5–5.2)
BUN: 23 mg/dL (ref 6–23)
Calcium: 8.7 mg/dL (ref 8.4–10.5)
Chloride: 101 mEq/L (ref 96–112)
Potassium: 4.4 mEq/L (ref 3.5–5.1)

## 2011-01-10 LAB — HEMOGLOBIN A1C: Hgb A1c MFr Bld: 7.6 % — ABNORMAL HIGH (ref 4.6–6.5)

## 2011-01-10 LAB — LIPID PANEL
Cholesterol: 110 mg/dL (ref 0–200)
LDL Cholesterol: 59 mg/dL (ref 0–99)

## 2011-01-11 ENCOUNTER — Other Ambulatory Visit: Payer: Self-pay | Admitting: *Deleted

## 2011-01-11 MED ORDER — PRAVASTATIN SODIUM 20 MG PO TABS: 20.0000 mg | ORAL_TABLET | Freq: Every day | ORAL | Status: DC

## 2011-01-17 ENCOUNTER — Encounter: Payer: Self-pay | Admitting: Family Medicine

## 2011-01-17 ENCOUNTER — Ambulatory Visit (INDEPENDENT_AMBULATORY_CARE_PROVIDER_SITE_OTHER): Payer: Medicare Other | Admitting: Family Medicine

## 2011-01-17 VITALS — BP 144/72 | HR 84 | Temp 98.2°F | Ht <= 58 in | Wt 157.0 lb

## 2011-01-17 DIAGNOSIS — R109 Unspecified abdominal pain: Secondary | ICD-10-CM

## 2011-01-17 DIAGNOSIS — R1013 Epigastric pain: Secondary | ICD-10-CM | POA: Insufficient documentation

## 2011-01-17 LAB — CBC WITH DIFFERENTIAL/PLATELET
Basophils Absolute: 0 10*3/uL (ref 0.0–0.1)
HCT: 37.6 % (ref 36.0–46.0)
Lymphs Abs: 1.1 10*3/uL (ref 0.7–4.0)
MCHC: 31 g/dL (ref 30.0–36.0)
MCV: 78 fl (ref 78.0–100.0)
Monocytes Absolute: 0.8 10*3/uL (ref 0.1–1.0)
Platelets: 307 10*3/uL (ref 150.0–400.0)
RDW: 19.6 % — ABNORMAL HIGH (ref 11.5–14.6)

## 2011-01-17 LAB — POCT URINALYSIS DIPSTICK
Blood, UA: NEGATIVE
Blood, UA: NEGATIVE
Glucose, UA: NEGATIVE
Leukocytes, UA: NEGATIVE
Nitrite, UA: NEGATIVE
Nitrite, UA: NEGATIVE
Protein, UA: NEGATIVE
Protein, UA: NEGATIVE
Spec Grav, UA: 1.01
Urobilinogen, UA: 0.2
Urobilinogen, UA: 0.2
pH, UA: 6.5
pH, UA: 6.5

## 2011-01-17 MED ORDER — AMOXICILLIN-POT CLAVULANATE 875-125 MG PO TABS: 1.0000 | ORAL_TABLET | Freq: Two times a day (BID) | ORAL | Status: DC

## 2011-01-17 NOTE — Assessment & Plan Note (Addendum)
4d h/o abd pain associated with back pain and change in stool initially, now normal stooling, last BM this morning. Abdominal tenderness, however not acute abdomen on exam today.  AFVSS No concern for obstruction.  S/p appendectomy and cholecystectomy.  Denies blood in stool or diarrhea. Known diverticulosis, states has had diverticulitis in past. Recent blood work last week overall normal, reviewed with pt (CMP).  Check CBC today.  Check UA today - normal. Start augmentin bid x 10 days to treat presumed diverticulitis.  Also start clear liquid diet for next 2 days for some bowel rest. Advised if any worsening, will need to seek urgent care.  To call tomorrow with update.

## 2011-01-17 NOTE — Progress Notes (Signed)
  Subjective:    [name], female    DOB: 27-Nov-1927, 77 y.o.   MRN: [id number]  HPI CC: "my diverticulitis is flaring up"  Was here for Medicare CPE however changed to acute sick visit due to below.  4d h/o bad stomach and back pain.  Yesterday and today hurting, abdomen feels tight.  Pain described as band like in lower abodmen.  When this started a bit more constipated.  Stool a bit darker.  H/o IBS per pt.  Thinks some of abd pain brought on by stress.  Today stooling normally.  Normally stools 1-2x/day.  Takes reglan tid for presumed gastroparesis from DM.  Pain comes and goes.  Back pain described as well.  Lower mid back travels to right lower back.  No fevers/chills.  No blood in stool.  Also worried about son who is in [place].  Lots of stress recently.  Last colonoscopy 02/2010 by Dr. Allyn Kenner.  No records of this colonoscopy, do have records of colonoscopy 2005 showing diverticulosis.  Last bm was this morning, regular. H/o appendectomy, hysterectomy with bilat oophorectomy, cholecystectomy.  Wt Readings from Last 3 Encounters:  01/17/11 157 lb (71.215 kg)  12/20/10 156 lb 1.3 oz (70.797 kg)  09/21/10 156 lb (70.761 kg)   Reviewed blood work with pt. Past Medical History  Diagnosis Date  . Hypertension   . Coronary artery disease   . Anxiety   . Diastolic dysfunction   . Hyperlipidemia   . Peptic ulcer disease   . Anemia   . Depression   . Diabetes mellitus     type 2   Past Surgical History  Procedure Date  . Tympanoplasty 1960s    R side  . Coronary artery bypass graft 1998  . Abdominal hysterectomy     TAH,BSO  . Cataract surgery   . Cholecystectomy   . Uterine prolapse surgery 2012    AT [facility]    Review of Systems Per HPI    Objective:   Physical Exam  Nursing note and vitals reviewed. Constitutional: She appears well-developed and well-nourished. No distress.       nontoxic  HENT:  Head: Normocephalic and atraumatic.    Mouth/Throat: Oropharynx is clear and moist. No oropharyngeal exudate.  Eyes: Conjunctivae and EOM are normal. Pupils are equal, round, and reactive to light. No scleral icterus.  Neck: Normal range of motion. Neck supple.  Cardiovascular: Normal rate, regular rhythm and intact distal pulses.   Murmur (2/6 SEM best at LUSB) heard. Pulmonary/Chest: Effort normal and breath sounds normal. No respiratory distress. She has no wheezes. She has no rales.  Abdominal: Soft. Bowel sounds are normal. She exhibits distension (slight). There is no hepatosplenomegaly. There is tenderness in the right lower quadrant, epigastric area, suprapubic area, left upper quadrant and left lower quadrant. There is no rigidity, no rebound, no guarding, no CVA tenderness and negative Murphy's sign.  Musculoskeletal: She exhibits edema (mild pitting edema bilaterally).  Lymphadenopathy:    She has no cervical adenopathy.  Skin: Skin is warm and dry. No rash noted.  Psychiatric: She has a normal mood and affect.      Assessment & Plan:

## 2011-01-17 NOTE — Patient Instructions (Addendum)
You could have diverticulitis. Treat with augmentin twice daily for 10 days. Ensure getting plenty of fluid with small sips throughout the day.  Clear liquid diet for next 2-3 days to give bowels a rest.  Then may slowly advance diet. If fever >101.5, or worsening pain, or changing bowel movements or blood in stool, please seek urgent care. Give me a call with an update tomorrow.  You can ask to speak with Maudie Mercury. Return for physical at your convenience (Monday?) Collect urine in cup and bring back today prior to starting antibiotic.  Work - [phone] (preferred) Home - [phone]

## 2011-01-18 NOTE — Progress Notes (Signed)
Addended by: Royann Shivers A on: 01/18/2011 08:34 AM   Modules accepted: Orders

## 2011-01-20 ENCOUNTER — Telehealth: Payer: Self-pay | Admitting: *Deleted

## 2011-01-20 MED ORDER — CIPROFLOXACIN HCL 500 MG PO TABS: 500.0000 mg | ORAL_TABLET | Freq: Two times a day (BID) | ORAL | Status: AC

## 2011-01-20 MED ORDER — METRONIDAZOLE 500 MG PO TABS: 500.0000 mg | ORAL_TABLET | Freq: Three times a day (TID) | ORAL | Status: DC

## 2011-01-20 NOTE — Telephone Encounter (Signed)
Patient notified of new Rx. She wanted to know if the new meds were going to cause diarrhea as well. I told her that is a possible side effect of just about all abx. She did inform me that she had been taking her stool softeners the whole time except for last night and today. I told her not to take those if she was having diarrhea because she didn't need them. She verbalized understanding and said she wouldn't take them unless she needed them.

## 2011-01-20 NOTE — Telephone Encounter (Signed)
Pt was seen on Monday for diverticulitis and was given augmentin.  She started this on Tuesday afternoon and it's causing diarrhea.  She has not taken any this morning and says she's not going to.  Uses [facility].

## 2011-01-20 NOTE — Telephone Encounter (Signed)
I received this message past 5pm. How is abdominal pain doing?  If completely better, doesn't need abx.   If still having abd pain, I would like her to take Cipro/flagyl.  Have sent in. Have added augmentin to intolerance list.

## 2011-01-20 NOTE — Telephone Encounter (Signed)
She is still having abd pain, says she needs to be taking something.

## 2011-01-24 ENCOUNTER — Emergency Department (HOSPITAL_COMMUNITY): Payer: Medicare Other

## 2011-01-24 ENCOUNTER — Ambulatory Visit (INDEPENDENT_AMBULATORY_CARE_PROVIDER_SITE_OTHER): Payer: Medicare Other | Admitting: Family Medicine

## 2011-01-24 ENCOUNTER — Encounter: Payer: Self-pay | Admitting: Family Medicine

## 2011-01-24 ENCOUNTER — Emergency Department: Payer: Medicare Other | Admitting: *Deleted

## 2011-01-24 ENCOUNTER — Emergency Department (HOSPITAL_COMMUNITY)
Admission: EM | Admit: 2011-01-24 | Discharge: 2011-01-24 | Disposition: A | Discharge status: Home / Self Care | Payer: Medicare Other | Attending: Emergency Medicine | Admitting: Emergency Medicine

## 2011-01-24 DIAGNOSIS — E119 Type 2 diabetes mellitus without complications: Secondary | ICD-10-CM | POA: Insufficient documentation

## 2011-01-24 DIAGNOSIS — K5730 Diverticulosis of large intestine without perforation or abscess without bleeding: Secondary | ICD-10-CM | POA: Insufficient documentation

## 2011-01-24 DIAGNOSIS — K449 Diaphragmatic hernia without obstruction or gangrene: Secondary | ICD-10-CM | POA: Insufficient documentation

## 2011-01-24 DIAGNOSIS — R109 Unspecified abdominal pain: Secondary | ICD-10-CM | POA: Insufficient documentation

## 2011-01-24 DIAGNOSIS — I10 Essential (primary) hypertension: Secondary | ICD-10-CM | POA: Insufficient documentation

## 2011-01-24 DIAGNOSIS — I517 Cardiomegaly: Secondary | ICD-10-CM | POA: Insufficient documentation

## 2011-01-24 DIAGNOSIS — I2510 Atherosclerotic heart disease of native coronary artery without angina pectoris: Secondary | ICD-10-CM | POA: Insufficient documentation

## 2011-01-24 LAB — POCT I-STAT, CHEM 8
BUN: 20 mg/dL (ref 6–23)
Calcium, Ion: 1.18 mmol/L (ref 1.12–1.32)
Chloride: 102 mEq/L (ref 96–112)
Creatinine, Ser: 0.9 mg/dL (ref 0.50–1.10)
Glucose, Bld: 92 mg/dL (ref 70–99)
HCT: 42 % (ref 36.0–46.0)
Hemoglobin: 14.3 g/dL (ref 12.0–15.0)
Potassium: 3.9 mEq/L (ref 3.5–5.1)
Sodium: 138 mEq/L (ref 135–145)
TCO2: 24 mmol/L (ref 0–100)

## 2011-01-24 LAB — CBC
HCT: 37.5 % (ref 36.0–46.0)
Hemoglobin: 11.8 g/dL — ABNORMAL LOW (ref 12.0–15.0)
WBC: 9.7 10*3/uL (ref 4.0–10.5)

## 2011-01-24 LAB — URINE MICROSCOPIC-ADD ON

## 2011-01-24 LAB — URINALYSIS, ROUTINE W REFLEX MICROSCOPIC
Bilirubin Urine: NEGATIVE
Glucose, UA: NEGATIVE mg/dL
Hgb urine dipstick: NEGATIVE
Protein, ur: NEGATIVE mg/dL
Urobilinogen, UA: 0.2 mg/dL (ref 0.0–1.0)

## 2011-01-24 MED ORDER — IOHEXOL 300 MG/ML  SOLN
80.0000 mL | Freq: Once | INTRAMUSCULAR | Status: AC | PRN
  Administered 2011-01-24: 80 mL via INTRAVENOUS

## 2011-01-24 NOTE — Patient Instructions (Signed)
Go to ER to be evaluated.

## 2011-01-24 NOTE — Progress Notes (Signed)
  Subjective:    [name], female    DOB: 1928/03/23, 77 y.o.   MRN: [id number]  HPI CC: i'm still feeling bad.  Seen 01/17/2011 with concern for diverticulitis.  WBC normal.  Started on augmentin, caused diarrhea so stopped.  Started on cipro and metronidazole.  Has been taking regularly for last week.  Entire abd, lower back, headache, and leg pain from varicose veins.  Able to sleep without pain.  Yesterday when urinated had blood on toilet paper, bright red.  Next stool did fine.  Feels gassy/bloated.  Going more frequently.  Denies fever/chills, nausea/vomiting.  No indigestion.  Has been limiting food intake.  Last bm was this morning.  Review of Systems Per HPI    Objective:   Physical Exam  Nursing note and vitals reviewed. Constitutional: She appears distressed.  HENT:  Mouth/Throat: Oropharynx is clear and moist. No oropharyngeal exudate.  Cardiovascular: Normal rate, regular rhythm, normal heart sounds and intact distal pulses.   No murmur heard. Pulmonary/Chest: Effort normal and breath sounds normal. No respiratory distress.       Crackles bibasilarly  Abdominal: Normal appearance. Bowel sounds are increased. There is no hepatosplenomegaly. There is generalized tenderness. There is no rebound, no guarding, no CVA tenderness and negative Murphy's sign.  Psychiatric: She has a normal mood and affect.      Assessment & Plan:

## 2011-01-24 NOTE — Assessment & Plan Note (Signed)
Thought diverticulitis. Not improved after 7d abx, on flagyl and cipro. Concern for developing intraabd process. Progression of abd pain since last visit. Vitals stable. rec pt go straight to ER. Pt refuses ambulance.

## 2011-01-25 ENCOUNTER — Ambulatory Visit (INDEPENDENT_AMBULATORY_CARE_PROVIDER_SITE_OTHER): Payer: Medicare Other | Admitting: Family Medicine

## 2011-01-25 ENCOUNTER — Encounter: Payer: Self-pay | Admitting: Gastroenterology

## 2011-01-25 ENCOUNTER — Telehealth: Payer: Self-pay | Admitting: *Deleted

## 2011-01-25 ENCOUNTER — Encounter: Payer: Self-pay | Admitting: Family Medicine

## 2011-01-25 VITALS — BP 122/78 | HR 80 | Temp 98.1°F | Wt 155.0 lb

## 2011-01-25 DIAGNOSIS — R109 Unspecified abdominal pain: Secondary | ICD-10-CM

## 2011-01-25 NOTE — Assessment & Plan Note (Addendum)
Reviewed blood work and ER visit.  CT scan without acute process. Pt endorses worsening diarrhea. As CT without evidence of diverticulitis, seems to have worsened on flagyl/cipro, rec stop flagyl, but continue cipro for 10 day course. ?IBS flare in setting of worsening stress recently. Currently on reglan 2/2 diarrhea, abd pain, sxs not consistent with gastroparesis. Not worse after meals, not consistent with mesenteric ischemia.  If not improved, consider checking lactate. rec bland diet, no gas -producing foods, lactose free diet.   To call me with update in next 2-3 days. As not improving, continued abdominal pain, will refer to GI for further evaluation.

## 2011-01-25 NOTE — Patient Instructions (Addendum)
I'd like to set you up with GI doctor.   Pass by Marion's office to set this up.  Let her know you have seen Dr. Watt Climes in past. I wonder if this is irritable bowel flare given your recent stress levels - stop flagyl (metronidazole).  Take ciprofloxacin twice daily to complete course. Take it easy for next several days.  Increase water. Exclude gas producing foods (beans, onions, celery, carrots, raisins, bananas, apricots, prunes, brussel sprouts, wheat germ, pretzels) Trail of lactose free diet (back off milk)

## 2011-01-25 NOTE — Telephone Encounter (Signed)
Spoke with pt.  seeing this afternoon.

## 2011-01-25 NOTE — Progress Notes (Signed)
  Subjective:    [name], female    DOB: [birth year], 77 y.o.   MRN: [id number]  HPI CC: F/u abd pain  Seen 01/17/2011 with concern for diverticulitis. WBC normal. Started on augmentin, caused diarrhea so stopped. Started on cipro and metronidazole (01/20/2011).  Took regularly for 5 days.  Presented 01/24/2011 to clinic with worsening abd, lower back pain, concern for failed outpatient diverticulitis treatment.  Sent to ER yesterday - neg CT scan for diverticulitis.  No cause of abd pain found.  Sent home, rec vicodin for pain.  Overnight had 5+ episodes of diarrhea.  Did drink milk and eat scrambled eggs prior.  Gassy/bloated feeling getting better.  Bottom raw from diarrhea, using neosporin to bottom.  Having some GERD sxs, mylanta helped.  Last stool this am - loose, brown.  Pain not related to food.    Denies fever/chills, nausea/vomiting. No indigestion. No dysuria, urgency, frequency.  Urinalysis overall normal yesterday.  Has been limiting food intake.  Toast, 7up, banana, coffee.  Does have h/o irritable bowel, no flare recently.  Last >66yrs ago, with cramping.  CT scan - extensive diverticulosis, no diverticulitis. Anal wall thickening felt due to mixing of stool and contrast. reviewed blood work from ER - mild microcytic anemia, kidneys and lytes normal.  Colonoscopy 02/11/2010 per pt by Dr. Allyn Kenner, normal.  No records available currently. Only have records of colonoscopy 12/2003.  Will request latest colonoscopy. H/o PUD and T2DM.  No recent changes to meds. Per pt, h/o appendectomy, cholecystectomy, hysterectomy  A lot of stressors recently - boyfriend not well, son in hospital, grandsos in prison.  Wt Readings from Last 3 Encounters:  01/25/11 155 lb (70.308 kg)  01/24/11 155 lb (70.308 kg)  01/17/11 157 lb (71.215 kg)   Review of Systems Per HPI    Objective:   Physical Exam  Nursing note and vitals reviewed. Constitutional: She appears well-developed  and well-nourished. No distress.  HENT:  Head: Normocephalic and atraumatic.  Mouth/Throat: Oropharynx is clear and moist. No oropharyngeal exudate.  Eyes: Conjunctivae and EOM are normal. Pupils are equal, round, and reactive to light. No scleral icterus.  Neck: Normal range of motion. Neck supple.  Cardiovascular: Normal rate, regular rhythm, normal heart sounds and intact distal pulses.   No murmur heard. Pulmonary/Chest: Effort normal and breath sounds normal. No respiratory distress. She has no wheezes. She has no rales.  Abdominal: Soft. Bowel sounds are normal. She exhibits no distension and no mass. There is tenderness (mild generalized). There is no rebound and no guarding.  Musculoskeletal: She exhibits no edema.  Lymphadenopathy:    She has no cervical adenopathy.  Skin: Skin is warm and dry. No rash noted.  Psychiatric: She has a normal mood and affect.      Assessment & Plan:

## 2011-01-25 NOTE — Telephone Encounter (Signed)
Pt called stating she has had diarrhea 3 times since 4 am and that it is urgent that she speak with Dr. Danise Mina regarding this.  Call sent to Dr. Darnell Level.

## 2011-01-26 ENCOUNTER — Telehealth: Payer: Self-pay | Admitting: *Deleted

## 2011-01-26 ENCOUNTER — Encounter: Payer: Self-pay | Admitting: *Deleted

## 2011-01-26 LAB — URINE CULTURE
Culture  Setup Time: 201210230335
Culture: NO GROWTH

## 2011-01-26 NOTE — Telephone Encounter (Signed)
If abd pain does get worse, do agree with evaluation at ER.
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
## 2011-01-26 NOTE — Telephone Encounter (Signed)
Called pt back. Abd pain worse than yesterday, constant pain and back pain. This morning ate dry toast, 2 eggs, and banana, and cup of coffee, cup of tea. abd pain staying with her.  Not worse after eating.  Did not go to work last night. Stopped flagyl.  Still on cipro. Recommended stop cipro for now. Call me tomorrow with update. Denies EtOH.

## 2011-01-26 NOTE — Telephone Encounter (Signed)
Pt states she still has abd pain, this is worse today.  No more diarrhea.Still taking antibiotic.  Please advise on what to do.

## 2011-01-27 NOTE — Telephone Encounter (Signed)
Spoke with patient this AM. She didn't have to go to ER last night. She said she is starting to feel a [place] better. She said she still has some abd pain, but not like it was and she hasn't had anymore diarrhea. Now she is concerned because she hasn't had a BM today. She took her stool softener last night and this morning and is just waiting to go, because she always goes everyday. I told her it was okay if she didn't go today because her stomach is still trying to get back to normal. She said she understood, but she might call back if she hasn't gone by this afternoon. I told her that was fine, but that it was still okay if she doesn't go today.

## 2011-01-28 ENCOUNTER — Encounter: Payer: Self-pay | Admitting: Family Medicine

## 2011-02-01 ENCOUNTER — Other Ambulatory Visit: Payer: Self-pay | Admitting: *Deleted

## 2011-02-01 MED ORDER — HYDROCODONE-ACETAMINOPHEN 5-500 MG PO TABS: 0.5000 | ORAL_TABLET | Freq: Four times a day (QID) | ORAL | Status: DC | PRN

## 2011-02-01 NOTE — Telephone Encounter (Signed)
plz phone in. 

## 2011-02-01 NOTE — Telephone Encounter (Signed)
Ok to refill 

## 2011-02-02 NOTE — Telephone Encounter (Signed)
Rx called in as directed.   

## 2011-02-16 ENCOUNTER — Ambulatory Visit: Payer: Medicare Other | Admitting: Gastroenterology

## 2011-02-22 ENCOUNTER — Ambulatory Visit (INDEPENDENT_AMBULATORY_CARE_PROVIDER_SITE_OTHER): Payer: Medicare Other | Admitting: Gastroenterology

## 2011-02-22 ENCOUNTER — Encounter: Payer: Self-pay | Admitting: Gastroenterology

## 2011-02-22 VITALS — BP 142/68 | HR 60 | Ht <= 58 in | Wt 150.0 lb

## 2011-02-22 DIAGNOSIS — R109 Unspecified abdominal pain: Secondary | ICD-10-CM

## 2011-02-22 NOTE — Progress Notes (Signed)
HPI: This is a   very pleasant 77 year old woman who I am meeting for the first time today.  She is a patient of Dr. Earlean Shawl. He has done at least 2 colonoscopies.  ONe in 2005 showed left sided diverticulosis, no polyps however he recommended repeat examination in 5 years. In 11/11 he repeated colonoscopy and again found universal, extensive diverticulosis.  He again recommended repeat colonoscopy in 5 years (at age 37).    I believe she was referred back to him recently however he suggested she be followed by lower gastroenterology because he "cannot round at the hospital."  It is not clear to me what is meant by that.  She has stomach pains.  All over.  She has had pains for 3-4 weeks now.  She was sent to hospital.  She a CT scan, showed.  She has been on reglan for years, unclear why.  Dr. Electa Sniff put her on it.  She thinks this helps with her having a normal bowel movement.  She sometimes has to push, strain to move her bowels.  Review of systems: Pertinent positive and negative review of systems were noted in the above HPI section. Complete review of systems was performed and was otherwise normal.    Past Medical History  Diagnosis Date  . Hypertension   . Coronary artery disease   . Anxiety   . Diastolic dysfunction   . Hyperlipidemia   . Peptic ulcer disease   . Anemia   . Depression   . Diabetes mellitus     type 2  . Diverticulosis   . Hemorrhoids     Past Surgical History  Procedure Date  . Tympanoplasty 1960s    R side  . Coronary artery bypass graft 1998  . Abdominal hysterectomy     TAH,BSO  . Cataract surgery   . Cholecystectomy   . Uterine prolapse surgery 2012    AT [facility]   . Appendectomy     per pt report  . Colonoscopy 02/2010    extensive diverticulosis throughout, small internal hemorrhoids, rec rpt 5 yrs (Dr. Allyn Kenner)  . Hand surgery     Current Outpatient Prescriptions  Medication Sig Dispense Refill  . ALPRAZolam (XANAX) 1 MG tablet Take 1  mg by mouth. Takes 1/2 tablet by mouth up to four times daily      . Ascorbic Acid (VITAMIN C PO) Take 1 tablet by mouth daily.        Marland Kitchen aspirin 81 MG tablet Take 81 mg by mouth 2 (two) times daily.       Marland Kitchen esomeprazole (NEXIUM) 40 MG capsule Take 1 capsule (40 mg total) by mouth daily.  30 capsule  3  . Estradiol (ESTRACE PO) Take 1 tablet by mouth daily.        Marland Kitchen glucosamine-chondroitin 500-400 MG tablet Take 1 tablet by mouth daily.        Marland Kitchen HYDROcodone-acetaminophen (VICODIN) 5-500 MG per tablet Take 0.5 tablets by mouth every 6 (six) hours as needed for pain.  90 tablet  0  . metoCLOPramide (REGLAN) 10 MG tablet Take 10 mg by mouth 2 (two) times daily.       . Multiple Vitamin (MULTIVITAMIN) tablet Take 1 tablet by mouth daily.        Marland Kitchen nystatin-triamcinolone (MYCOLOG II) cream Apply topically daily.  60 g  2  . pioglitazone (ACTOS) 45 MG tablet Take 45 mg by mouth daily.        . pravastatin (PRAVACHOL)  20 MG tablet Take 1 tablet (20 mg total) by mouth daily.  30 tablet  12  . ramipril (ALTACE) 5 MG tablet Take 5 mg by mouth daily.       . traZODone (DESYREL) 150 MG tablet Take 150 mg by mouth at bedtime.        . triamterene-hydrochlorothiazide (DYAZIDE) 37.5-25 MG per capsule Take 1 capsule by mouth every morning. DOSE UNKNOWN  30 capsule  6  . VITAMIN E PO Take 1 tablet by mouth daily.          Allergies as of 02/22/2011 - Review Complete 02/22/2011  Allergen Reaction Noted  . Augmentin Diarrhea 01/20/2011    Family History  Problem Relation Age of Onset  . Hypertension Mother   . Stroke Mother   . Diabetes Father   . Diabetes Sister   . Diabetes Brother     History   Social History  . Marital Status: Single    Spouse Name: N/A    Number of Children: N/A  . Years of Education: N/A   Occupational History  . Waitress     Social History Main Topics  . Smoking status: Never Smoker   . Smokeless tobacco: Never Used  . Alcohol Use: No  . Drug Use: No  . Sexually  Active: Not on file   Other Topics Concern  . 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         Physical Exam: Ht 4\' 10"  (1.473 m)  Wt 150 lb (68.04 kg)  BMI 31.35 kg/m2 Constitutional: generally well-appearing Psychiatric: alert and oriented x3 Eyes: extraocular movements intact Mouth: oral pharynx moist, no lesions Neck: supple no lymphadenopathy Cardiovascular: heart regular rate and rhythm Lungs: clear to auscultation bilaterally Abdomen: soft, nontender, nondistended, no obvious ascites, no peritoneal signs, normal bowel sounds Extremities: no lower extremity edema bilaterally Skin: no lesions on visible extremities    Assessment and plan: 77 y.o. female with  chronic lower abdominal pains, chronic diverticulosis  I think some of her symptoms might be related to intermittent constipation she takes narcotics chronically. Recommend she try to cut back on that. She is also on Reglan which I am not a big fan of. I'm going to have her stop that and she will start MiraLax once daily. She will call my office to reported her symptoms in 4-5 weeks.

## 2011-02-22 NOTE — Patient Instructions (Addendum)
Start taking once daily miralax, one dose a day to help with your constipation.  Please call Dr. Ardis Hughs' office to report on your symptoms in 4-5 weeks. If you have severe abdominal pains, then need to consider treatment with antibiotics for diverticulitis. Your hydrocodone can cause abd pains, constipation, nausea and slow stomach.  You should try to limit this as much as possible. You do NOT need routine screening for colon cancer, these tests usually stop around age 58-80. Stop the metaclopramide.

## 2011-03-05 HISTORY — PX: OTHER SURGICAL HISTORY: SHX169

## 2011-03-15 ENCOUNTER — Ambulatory Visit: Payer: Medicare Other | Admitting: Nurse Practitioner

## 2011-03-15 ENCOUNTER — Encounter: Payer: Self-pay | Admitting: Cardiovascular Disease

## 2011-03-15 ENCOUNTER — Ambulatory Visit (INDEPENDENT_AMBULATORY_CARE_PROVIDER_SITE_OTHER): Payer: Medicare Other | Admitting: Cardiovascular Disease

## 2011-03-15 VITALS — BP 128/64 | HR 89 | Ht <= 58 in | Wt 147.4 lb

## 2011-03-15 DIAGNOSIS — I2510 Atherosclerotic heart disease of native coronary artery without angina pectoris: Secondary | ICD-10-CM

## 2011-03-15 DIAGNOSIS — R079 Chest pain, unspecified: Secondary | ICD-10-CM

## 2011-03-15 NOTE — Progress Notes (Signed)
[place] Date of Birth  [birth year] [facility] [address]    [place], [place]  [address] [phone]  Fax  [phone]  History of Present Illness:  [name] is an 77 yo female with a Hx of CAD, CABG, diabetes mellitus, anxiety, HTN, hyperlipidemia.    She's been under a lot of stress recently. Her son died several months ago. She's under a lot of stress with the extra expenses involved such as his funeral.  Current Outpatient Prescriptions on File Prior to Visit  Medication Sig Dispense Refill  . ALPRAZolam (XANAX) 1 MG tablet Take 1 mg by mouth. Takes 1/2 tablet by mouth up to four times daily      . Ascorbic Acid (VITAMIN C PO) Take 1 tablet by mouth daily.        Marland Kitchen aspirin 81 MG tablet Take 81 mg by mouth 2 (two) times daily.       Marland Kitchen esomeprazole (NEXIUM) 40 MG capsule Take 1 capsule (40 mg total) by mouth daily.  30 capsule  3  . Estradiol (ESTRACE PO) Take 1 tablet by mouth daily.        Marland Kitchen glucosamine-chondroitin 500-400 MG tablet Take 1 tablet by mouth daily.        Marland Kitchen HYDROcodone-acetaminophen (VICODIN) 5-500 MG per tablet Take 0.5 tablets by mouth every 6 (six) hours as needed for pain.  90 tablet  0  . Multiple Vitamin (MULTIVITAMIN) tablet Take 1 tablet by mouth daily.        Marland Kitchen nystatin-triamcinolone (MYCOLOG II) cream Apply topically daily.  60 g  2  . pioglitazone (ACTOS) 45 MG tablet Take 45 mg by mouth daily.        . pravastatin (PRAVACHOL) 20 MG tablet Take 1 tablet (20 mg total) by mouth daily.  30 tablet  12  . ramipril (ALTACE) 5 MG tablet Take 5 mg by mouth daily.       . traZODone (DESYREL) 150 MG tablet Take 150 mg by mouth at bedtime.        . triamterene-hydrochlorothiazide (DYAZIDE) 37.5-25 MG per capsule Take 1 capsule by mouth every morning. DOSE UNKNOWN  30 capsule  6  . VITAMIN E PO Take 1 tablet by mouth daily.          Allergies  Allergen Reactions  . Augmentin Diarrhea    Past Medical History  Diagnosis Date  .  Hypertension   . Coronary artery disease   . Anxiety   . Diastolic dysfunction   . Hyperlipidemia   . Peptic ulcer disease   . Anemia   . Depression   . Diabetes mellitus     type 2  . Diverticulosis   . Hemorrhoids   . History of heart attack     Past Surgical History  Procedure Date  . Tympanoplasty 1960s    R side  . Coronary artery bypass graft 1998  . Abdominal hysterectomy     TAH,BSO  . Cataract surgery   . Cholecystectomy   . Uterine prolapse surgery 2012    [facility]   . Appendectomy     per pt report  . Colonoscopy 02/2010    extensive diverticulosis throughout, small internal hemorrhoids, rec rpt 5 yrs (Dr. Allyn Kenner)  . Hand surgery     History  Smoking status  . Never Smoker   Smokeless tobacco  . Never Used    History  Alcohol Use No    Family History  Problem Relation Age of Onset  . Hypertension Mother   . Stroke Mother   . Diabetes Father   . Diabetes Sister   . Diabetes Brother   . Colon cancer Neg Hx     Reviw of Systems:  Reviewed in the HPI.  All other systems are negative.  Physical Exam: BP 128/64  Pulse 89  Ht 4\' 10"  (1.473 m)  Wt 147 lb 6.4 oz (66.86 kg)  BMI 30.81 kg/m2 The patient is alert and oriented x 3.  The mood and affect are normal.   Skin: warm and dry.  Color is normal.    HEENT:   Normocephalic/atraumatic. Neck is supple. Her mucous membranes are moist. The JVD. Her carotids are normal.  Lungs: Lung exam is clear.   Heart: Regular rate S1-S2. She has no murmurs.    Abdomen: Abdomen soft.  Extremities:  No clubbing cyanosis or edema  Neuro:  Exam is nonfocal.    ECG: NSR, Premature atrial contractions  Assessment / Plan:

## 2011-03-15 NOTE — Assessment & Plan Note (Signed)
[name] presents today with episodes of chest discomfort. She has a history of coronary artery disease, hypertension, hyperlipidemia. These episodes of pain could be do to the increased stress. Her son died earlier this year she still having lots of issues.  We'll try to schedule the stress test in the next week. I'll see her again in 6 months for an office visit and fasting lab work.

## 2011-03-15 NOTE — Patient Instructions (Addendum)
Your physician wants you to follow-up in: 6 months You will receive a reminder letter in the mail two months in advance. If you don't receive a letter, please call our office to schedule the follow-up appointment.  Your physician recommends that you return for a FASTING lipid profile: 6 months  Your physician has requested that you have a lexiscan myoview. For further information please visit [email or web address]. Please follow instruction sheet, as given. DR WANTING SOON< Monday OR Tuesday 17th or 18th

## 2011-03-21 ENCOUNTER — Other Ambulatory Visit: Payer: Self-pay | Admitting: *Deleted

## 2011-03-21 MED ORDER — HYDROCODONE-ACETAMINOPHEN 5-500 MG PO TABS: 0.5000 | ORAL_TABLET | Freq: Four times a day (QID) | ORAL | Status: DC | PRN

## 2011-03-21 NOTE — Telephone Encounter (Signed)
OK to refill

## 2011-03-21 NOTE — Telephone Encounter (Signed)
Ok to refill 

## 2011-03-22 ENCOUNTER — Ambulatory Visit (HOSPITAL_COMMUNITY): Payer: Medicare Other | Attending: Cardiology | Admitting: Radiology

## 2011-03-22 DIAGNOSIS — R079 Chest pain, unspecified: Secondary | ICD-10-CM

## 2011-03-22 DIAGNOSIS — I10 Essential (primary) hypertension: Secondary | ICD-10-CM | POA: Insufficient documentation

## 2011-03-22 DIAGNOSIS — I2510 Atherosclerotic heart disease of native coronary artery without angina pectoris: Secondary | ICD-10-CM | POA: Insufficient documentation

## 2011-03-22 DIAGNOSIS — I4949 Other premature depolarization: Secondary | ICD-10-CM

## 2011-03-22 DIAGNOSIS — Z951 Presence of aortocoronary bypass graft: Secondary | ICD-10-CM | POA: Insufficient documentation

## 2011-03-22 DIAGNOSIS — E785 Hyperlipidemia, unspecified: Secondary | ICD-10-CM | POA: Insufficient documentation

## 2011-03-22 MED ORDER — REGADENOSON 0.4 MG/5ML IV SOLN
0.4000 mg | Freq: Once | INTRAVENOUS | Status: AC
  Administered 2011-03-22: 0.4 mg via INTRAVENOUS

## 2011-03-22 MED ORDER — TECHNETIUM TC 99M TETROFOSMIN IV KIT
33.0000 | PACK | Freq: Once | INTRAVENOUS | Status: AC | PRN
  Administered 2011-03-22: 33 via INTRAVENOUS

## 2011-03-22 MED ORDER — TECHNETIUM TC 99M TETROFOSMIN IV KIT
11.0000 | PACK | Freq: Once | INTRAVENOUS | Status: AC | PRN
  Administered 2011-03-22: 11 via INTRAVENOUS

## 2011-03-22 NOTE — Progress Notes (Signed)
[facility] NUCLEAR MED [place] [address] [phone]  Cardiology Nuclear Med Study  [name] is a 77 y.o. female [id number] May 11, 1927   Nuclear Med Background Indication for Stress Test:  Evaluation for Ischemia and Graft Patency History: '98 CABGx3, '05 Echo: EF 55-60%, '05 Heart Catheterization: EF 60-65% and 11/05 Myocardial Perfusion Study: EF 56% Cardiac Risk Factors: Hypertension and Lipids  Symptoms:  Chest Pain   Nuclear Pre-Procedure Caffeine/Decaff Intake:  None NPO After: 8:30am   Lungs: clear IV 0.9% NS with Angio Cath:  20g  IV Site: R Antecubital  IV Started by:  Eliezer Lofts, EMT-P  Chest Size (in):  38 Cup Size: C  Height: 4\' 10"  (1.473 m)  Weight:  148 lb (67.132 kg)  BMI:  Body mass index is 30.93 kg/(m^2). Tech Comments:  CBG= 118 @ 11:30 today, per patient.    Nuclear Med Study 1 or 2 day study: 1 day  Stress Test Type:  Carlton Adam  Reading MD: Loralie Champagne, MD  Order Authorizing Provider:  P.Nahser  Resting Radionuclide: Technetium 41m Tetrofosmin  Resting Radionuclide Dose: 11.0 mCi   Stress Radionuclide:  Technetium 42m Tetrofosmin  Stress Radionuclide Dose: 33.0 mCi           Stress Protocol Rest HR: 62 Stress HR: 92  Rest BP: 136/67 Stress BP: 139/52  Exercise Time (min): n/a METS: n/a   Predicted Max HR: 139 bpm % Max HR: 66.19 bpm Rate Pressure Product: 12788   Dose of Adenosine (mg):  n/a Dose of Lexiscan: 0.4 mg  Dose of Atropine (mg): n/a Dose of Dobutamine: n/a mcg/kg/min (at max HR)  Stress Test Technologist: Perrin Maltese, EMT-P  Nuclear Technologist:  Charlton [name], CNMT     Rest Procedure:  Myocardial perfusion imaging was performed at rest 45 minutes following the intravenous administration of Technetium 44m Tetrofosmin. Rest ECG: NSR  Stress Procedure:  The patient received IV Lexiscan 0.4 mg over 15-seconds.  Technetium 56m Tetrofosmin injected at 30-seconds.  There  were no significant changes, sob, headache, and occ pacs with Lexiscan.  Quantitative spect images were obtained after a 45 minute delay. Stress ECG: No significant change from baseline ECG  QPS Raw Data Images:  Normal; no motion artifact; normal heart/lung ratio. Stress Images:  Basal inferolateral and anterolateral perfusion defect.  Rest Images:  Normal homogeneous uptake in all areas of the myocardium. Subtraction (SDS):  Reversible basal inferolateral and basal anterolateral perfusion defect.  Transient Ischemic Dilatation (Normal <1.22):  1.14 Lung/Heart Ratio (Normal <0.45):  0.46  Quantitative Gated Spect Images QGS EDV:  85 ml QGS ESV:  31 ml QGS cine images:  NL LV Function; NL Wall Motion QGS EF: 63%  Impression Exercise Capacity:  Lexiscan with no exercise. BP Response:  Normal blood pressure response. Clinical Symptoms:  Shortness of breath.  ECG Impression:  No significant ST segment change suggestive of ischemia. Comparison with Prior Nuclear Study: Prior study showed no perfusion defect.   Overall Impression:  Small basal inferolateral and anterolateral reversible perfusion defect suggests ischemia.  EF was normal.   Loralie Champagne

## 2011-03-22 NOTE — Telephone Encounter (Signed)
Rx called in as directed.   

## 2011-03-24 ENCOUNTER — Other Ambulatory Visit: Payer: Self-pay | Admitting: Cardiovascular Disease

## 2011-03-24 ENCOUNTER — Encounter (HOSPITAL_COMMUNITY): Payer: Self-pay | Admitting: Pharmacy Technician

## 2011-03-24 ENCOUNTER — Encounter: Payer: Self-pay | Admitting: Family Medicine

## 2011-03-24 ENCOUNTER — Other Ambulatory Visit: Payer: Self-pay | Admitting: *Deleted

## 2011-03-24 ENCOUNTER — Telehealth: Payer: Self-pay | Admitting: *Deleted

## 2011-03-24 DIAGNOSIS — I2510 Atherosclerotic heart disease of native coronary artery without angina pectoris: Secondary | ICD-10-CM

## 2011-03-24 MED ORDER — NITROGLYCERIN 0.4 MG SL SUBL: 0.4000 mg | SUBLINGUAL_TABLET | SUBLINGUAL | Status: DC | PRN

## 2011-03-24 NOTE — Telephone Encounter (Signed)
Dr Acie Fredrickson looked at nuc study to compare results and wants pt to have [place], He called pt and pt declines at this time to do anything in near future due to holidays. We Will re address this after the holidays per pt ok.

## 2011-03-25 ENCOUNTER — Encounter (HOSPITAL_COMMUNITY): Admission: RE | Payer: Self-pay | Source: Ambulatory Visit

## 2011-03-25 ENCOUNTER — Ambulatory Visit (HOSPITAL_COMMUNITY): Admission: RE | Admit: 2011-03-25 | Payer: Medicare Other | Source: Ambulatory Visit | Admitting: Cardiovascular Disease

## 2011-03-25 SURGERY — LEFT HEART CATHETERIZATION WITH CORONARY/GRAFT ANGIOGRAM
Anesthesia: LOCAL

## 2011-04-05 DIAGNOSIS — I739 Peripheral vascular disease, unspecified: Secondary | ICD-10-CM

## 2011-04-05 HISTORY — DX: Peripheral vascular disease, unspecified: I73.9

## 2011-04-05 HISTORY — PX: OTHER SURGICAL HISTORY: SHX169

## 2011-04-12 ENCOUNTER — Ambulatory Visit (INDEPENDENT_AMBULATORY_CARE_PROVIDER_SITE_OTHER): Payer: Medicare Other | Admitting: Cardiovascular Disease

## 2011-04-12 ENCOUNTER — Encounter: Payer: Self-pay | Admitting: Cardiovascular Disease

## 2011-04-12 VITALS — BP 128/56 | HR 83 | Ht <= 58 in | Wt 149.8 lb

## 2011-04-12 DIAGNOSIS — I2510 Atherosclerotic heart disease of native coronary artery without angina pectoris: Secondary | ICD-10-CM

## 2011-04-12 DIAGNOSIS — E785 Hyperlipidemia, unspecified: Secondary | ICD-10-CM

## 2011-04-12 MED ORDER — METOPROLOL TARTRATE 25 MG PO TABS: 25.0000 mg | ORAL_TABLET | Freq: Two times a day (BID) | ORAL | Status: DC

## 2011-04-12 NOTE — Patient Instructions (Addendum)
Your physician wants you to follow-up in: 3 months You will receive a reminder letter in the mail two months in advance. If you don't receive a letter, please call our office to schedule the follow-up appointment.  Your physician has recommended you make the following change in your medication:  START: metoprolol 25 mg one tablet two times a day sent to pharmacy / used for palpitations

## 2011-04-12 NOTE — Assessment & Plan Note (Signed)
[name] is not having any further episodes of chest discomfort. She has known coronary disease. She is scared to have the cardiac catheterization.  I Am pleased that she's not had any further episodes of angina. Her only complaint is that of occasional tachycardia palpitations. We will treat her with metoprolol 25 mg twice a day.  I'll see her in 3 months for followup visit.

## 2011-04-12 NOTE — Progress Notes (Signed)
[place] Date of Birth  1927-12-21 [facility] [address]    [place], [place]  [address] [phone]  Fax  [phone]  History of Present Illness:  [name] is an 78 yo female with a Hx of CAD, CABG ( November 09, 1994), diabetes mellitus, anxiety, HTN, hyperlipidemia.    She's been under a lot of stress recently. Her son died last year. She's under a lot of stress with the extra expenses involved such as his funeral.  She presented with some CP in December.  A stress myoview revealed lateral ischemia.  We offered cardiac cath but she did not want to have the cath.  She presents today without any particular complaints. She has not had any episodes of chest pain or shortness of breath  saw her a month ago. She does have occasional episodes of tachycardia. She also notes that she has some anxiety.  Current Outpatient Prescriptions on File Prior to Visit  Medication Sig Dispense Refill  . ALPRAZolam (XANAX) 1 MG tablet Take 1 mg by mouth. Takes 1/2 tablet by mouth up to four times daily      . Ascorbic Acid (VITAMIN C PO) Take 1 tablet by mouth daily.        Marland Kitchen aspirin 81 MG tablet Take 81 mg by mouth 2 (two) times daily.       Marland Kitchen esomeprazole (NEXIUM) 40 MG capsule Take 1 capsule (40 mg total) by mouth daily.  30 capsule  3  . Estradiol (ESTRACE PO) Take 1 tablet by mouth daily.        Marland Kitchen glucosamine-chondroitin 500-400 MG tablet Take 1 tablet by mouth daily.        Marland Kitchen HYDROcodone-acetaminophen (VICODIN) 5-500 MG per tablet Take 0.5 tablets by mouth every 6 (six) hours as needed for pain.  90 tablet  0  . Multiple Vitamin (MULTIVITAMIN) tablet Take 1 tablet by mouth daily.        . nitroGLYCERIN (NITROSTAT) 0.4 MG SL tablet Place 1 tablet (0.4 mg total) under the tongue every 5 (five) minutes as needed for chest pain. May take up to 3 doses, if pain persists call 911.  25 tablet  3  . nystatin-triamcinolone (MYCOLOG II) cream Apply topically daily.  60 g  2  .  pioglitazone (ACTOS) 45 MG tablet Take 45 mg by mouth daily.        . pravastatin (PRAVACHOL) 20 MG tablet Take 1 tablet (20 mg total) by mouth daily.  30 tablet  12  . ramipril (ALTACE) 5 MG tablet Take 5 mg by mouth daily.       . traZODone (DESYREL) 150 MG tablet Take 150 mg by mouth at bedtime.        . triamterene-hydrochlorothiazide (DYAZIDE) 37.5-25 MG per capsule Take 1 capsule by mouth every morning. DOSE UNKNOWN  30 capsule  6  . VITAMIN E PO Take 1 tablet by mouth daily.          Allergies  Allergen Reactions  . Augmentin Diarrhea    Past Medical History  Diagnosis Date  . Hypertension   . Coronary artery disease   . Anxiety   . Diastolic dysfunction   . Hyperlipidemia   . Peptic ulcer disease   . Anemia   . Depression   . Diabetes mellitus     type 2  . Diverticulosis   . Hemorrhoids   . History of heart attack     Past Surgical History  Procedure Date  . Tympanoplasty 1960s    R side  . Coronary artery bypass graft 1998  . Abdominal hysterectomy     TAH,BSO  . Cataract surgery   . Cholecystectomy   . Uterine prolapse surgery 2012    [facility]   . Appendectomy     per pt report  . Colonoscopy 02/2010    extensive diverticulosis throughout, small internal hemorrhoids, rec rpt 5 yrs (Dr. Allyn Kenner)  . Hand surgery   . Lexiscan myoview 03/2011    Small basal inferolateral and anterolateral reversible perfusion defect suggests ischemia.  EF was normal.   old infarct, no new ischemia    History  Smoking status  . Never Smoker   Smokeless tobacco  . Never Used    History  Alcohol Use No    Family History  Problem Relation Age of Onset  . Hypertension Mother   . Stroke Mother   . Diabetes Father   . Diabetes Sister   . Diabetes Brother   . Colon cancer Neg Hx     Reviw of Systems:  Reviewed in the HPI.  All other systems are negative.  Physical Exam: BP 128/56  Pulse 83  Ht 4\' 10"  (1.473 m)  Wt 149 lb 12.8 oz (67.949 kg)  BMI 31.31  kg/m2 The patient is alert and oriented x 3.  The mood and affect are normal.   Skin: warm and dry.  Color is normal.    HEENT:   Normocephalic/atraumatic. Neck is supple. Her mucous membranes are moist. The JVD. Her carotids are normal.  Lungs: Lung exam is clear.   Heart: Regular rate S1-S2. She has no murmurs.    Abdomen: Abdomen soft.  Extremities:  No clubbing cyanosis or edema  Neuro:  Exam is nonfocal.    ECG: NSR, Premature atrial contractions  Assessment / Plan:

## 2011-04-12 NOTE — Assessment & Plan Note (Signed)
We will check a fasting lipid profile at her next office visit.

## 2011-04-14 ENCOUNTER — Encounter: Payer: Self-pay | Admitting: *Deleted

## 2011-04-22 ENCOUNTER — Other Ambulatory Visit: Payer: Self-pay | Admitting: Gynecology

## 2011-05-02 DIAGNOSIS — F429 Obsessive-compulsive disorder, unspecified: Secondary | ICD-10-CM | POA: Diagnosis not present

## 2011-05-03 ENCOUNTER — Encounter: Payer: Self-pay | Admitting: Family Medicine

## 2011-05-09 ENCOUNTER — Other Ambulatory Visit: Payer: Self-pay | Admitting: *Deleted

## 2011-05-09 MED ORDER — HYDROCODONE-ACETAMINOPHEN 5-500 MG PO TABS: 0.5000 | ORAL_TABLET | Freq: Four times a day (QID) | ORAL | Status: DC | PRN

## 2011-05-09 NOTE — Telephone Encounter (Signed)
plz phone in. Will send #60 instead of #90, would recommend try and back down on use as this can cause/worsen constipation and abd pain

## 2011-05-10 NOTE — Telephone Encounter (Signed)
Rx called in as directed. Patient aware to try to use less frequently if possible due to constipation/abd pain issues. She verbalized understanding.

## 2011-05-18 DIAGNOSIS — H04129 Dry eye syndrome of unspecified lacrimal gland: Secondary | ICD-10-CM | POA: Diagnosis not present

## 2011-06-06 ENCOUNTER — Other Ambulatory Visit: Payer: Self-pay | Admitting: *Deleted

## 2011-06-06 MED ORDER — HYDROCODONE-ACETAMINOPHEN 5-500 MG PO TABS: 0.5000 | ORAL_TABLET | Freq: Four times a day (QID) | ORAL | Status: DC | PRN

## 2011-06-06 NOTE — Telephone Encounter (Signed)
Plz phone in

## 2011-06-07 NOTE — Telephone Encounter (Signed)
Rx called in as directed.   

## 2011-06-20 ENCOUNTER — Ambulatory Visit (INDEPENDENT_AMBULATORY_CARE_PROVIDER_SITE_OTHER): Payer: Medicare Other | Admitting: Cardiovascular Disease

## 2011-06-20 ENCOUNTER — Encounter: Payer: Self-pay | Admitting: Cardiovascular Disease

## 2011-06-20 VITALS — BP 127/61 | HR 84 | Ht <= 58 in | Wt 152.0 lb

## 2011-06-20 DIAGNOSIS — R079 Chest pain, unspecified: Secondary | ICD-10-CM

## 2011-06-20 DIAGNOSIS — I2510 Atherosclerotic heart disease of native coronary artery without angina pectoris: Secondary | ICD-10-CM | POA: Diagnosis not present

## 2011-06-20 DIAGNOSIS — E785 Hyperlipidemia, unspecified: Secondary | ICD-10-CM

## 2011-06-20 LAB — LIPID PANEL
HDL: 33.1 mg/dL — ABNORMAL LOW (ref >39.00)
Total CHOL/HDL Ratio: 3

## 2011-06-20 LAB — BASIC METABOLIC PANEL
BUN: 28 mg/dL — ABNORMAL HIGH (ref 6–23)
CO2: 29 mEq/L (ref 19–32)
Calcium: 9.2 mg/dL (ref 8.4–10.5)
Glucose, Bld: 106 mg/dL — ABNORMAL HIGH (ref 70–99)
Sodium: 137 mEq/L (ref 135–145)

## 2011-06-20 LAB — HEPATIC FUNCTION PANEL
ALT: 20 U/L (ref 0–35)
AST: 23 U/L (ref 0–37)
Alkaline Phosphatase: 85 U/L (ref 39–117)
Bilirubin, Direct: 0 mg/dL (ref 0.0–0.3)
Total Bilirubin: 0.1 mg/dL — ABNORMAL LOW (ref 0.3–1.2)

## 2011-06-20 NOTE — Assessment & Plan Note (Signed)
We'll check fasting labs on her when I see her again in the office in 6 months.

## 2011-06-20 NOTE — Progress Notes (Signed)
[place] Date of Birth  November 24, 1927 [facility] [address]    [place], [place]  [address] [phone]  Fax  [phone]  History of Present Illness:  [name] is an 78 yo female with a Hx of CAD, CABG ( November 09, 1994), diabetes mellitus, anxiety, HTN, hyperlipidemia.    She's been under a lot of stress recently. Her son died last year. She's under a lot of stress with the extra expenses involved such as his funeral.  She presented with some CP in December.  A stress myoview revealed lateral ischemia.  We offered cardiac cath but she did not want to have the cath.  She presents today without any particular complaints. She has not had any episodes of chest pain or shortness of breath  saw her a month ago. She does have occasional episodes of tachycardia. She also notes that she has some anxiety.  She was last seen in January after having an abnormal stress test that revealed anterior apical ischemia. She refused cardiac catheterization at that time. She has not had any recent episodes of chest pain.  She does not want to have a cardiac catheterization since she's feeling so well.  Current Outpatient Prescriptions on File Prior to Visit  Medication Sig Dispense Refill  . ALPRAZolam (XANAX) 1 MG tablet Take 1 mg by mouth. Takes 1/2 tablet by mouth up to four times daily      . aspirin 81 MG tablet Take 81 mg by mouth 2 (two) times daily.       Marland Kitchen esomeprazole (NEXIUM) 40 MG capsule Take 1 capsule (40 mg total) by mouth daily.  30 capsule  3  . Estradiol (ESTRACE PO) Take 1 tablet by mouth daily.        Marland Kitchen glucosamine-chondroitin 500-400 MG tablet Take 1 tablet by mouth daily.        Marland Kitchen HYDROcodone-acetaminophen (VICODIN) 5-500 MG per tablet Take 0.5 tablets by mouth every 6 (six) hours as needed for pain.  60 tablet  0  . Multiple Vitamin (MULTIVITAMIN) tablet Take 1 tablet by mouth daily.        . nitroGLYCERIN (NITROSTAT) 0.4 MG SL tablet Place 1 tablet (0.4  mg total) under the tongue every 5 (five) minutes as needed for chest pain. May take up to 3 doses, if pain persists call 911.  25 tablet  3  . nystatin-triamcinolone (MYCOLOG II) cream APPLY TO AFFECTED AREA DAILY  60 g  1  . pioglitazone (ACTOS) 45 MG tablet Take 45 mg by mouth daily.        . pravastatin (PRAVACHOL) 20 MG tablet Take 1 tablet (20 mg total) by mouth daily.  30 tablet  12  . ramipril (ALTACE) 5 MG tablet Take 5 mg by mouth daily.       . traZODone (DESYREL) 150 MG tablet Take 150 mg by mouth at bedtime.        . triamterene-hydrochlorothiazide (DYAZIDE) 37.5-25 MG per capsule Take 1 capsule by mouth every morning. DOSE UNKNOWN  30 capsule  6  . VITAMIN E PO Take 1 tablet by mouth daily.        . Ascorbic Acid (VITAMIN C PO) Take 1 tablet by mouth daily.        . metoprolol tartrate (LOPRESSOR) 25 MG tablet Take 1 tablet (25 mg total) by mouth 2 (two) times daily.  60 tablet  11    Allergies  Allergen Reactions  . Augmentin Diarrhea  Past Medical History  Diagnosis Date  . Hypertension   . Coronary artery disease     CABG 1998  . Anxiety   . Diastolic dysfunction   . Hyperlipidemia   . Peptic ulcer disease   . Anemia   . Depression   . Diabetes mellitus     type 2  . Diverticulosis   . Hemorrhoids   . History of heart attack     Past Surgical History  Procedure Date  . Tympanoplasty 1960s    R side  . Coronary artery bypass graft 1998  . Abdominal hysterectomy     TAH,BSO  . Cataract surgery   . Cholecystectomy   . Uterine prolapse surgery 2012    [facility]   . Appendectomy     per pt report  . Colonoscopy 02/2010    extensive diverticulosis throughout, small internal hemorrhoids, rec rpt 5 yrs (Dr. Allyn Kenner)  . Hand surgery   . Lexiscan myoview 03/2011    Small basal inferolateral and anterolateral reversible perfusion defect suggests ischemia.  EF was normal.   old infarct, no new ischemia    History  Smoking status  . Never Smoker     Smokeless tobacco  . Never Used    History  Alcohol Use No    Family History  Problem Relation Age of Onset  . Hypertension Mother   . Stroke Mother   . Diabetes Father   . Diabetes Sister   . Diabetes Brother   . Colon cancer Neg Hx     Reviw of Systems:  Reviewed in the HPI.  All other systems are negative.  Physical Exam: BP 127/61  Pulse 84  Ht 4\' 10"  (1.473 m)  Wt 152 lb (68.947 kg)  BMI 31.77 kg/m2 The patient is alert and oriented x 3.  The mood and affect are normal.   Skin: warm and dry.  Color is normal.    HEENT:   Normocephalic/atraumatic. Neck is supple. Her mucous membranes are moist. The JVD. Her carotids are normal.  Lungs: Lung exam is clear.   Heart: Regular rate S1-S2. She has no murmurs.    Abdomen: Abdomen soft.  Extremities:  No clubbing cyanosis or edema  Neuro:  Exam is nonfocal.    ECG: Normal sinus rhythm with occasional premature supraventricular complexes.  Assessment / Plan:

## 2011-06-20 NOTE — Assessment & Plan Note (Signed)
[name] seems to be doing well. She's not complaining any further episodes of chest pain. I'll see her back in the office in 6 months for an office visit and fasting labs.

## 2011-06-20 NOTE — Patient Instructions (Signed)
Your physician wants you to follow-up in: 6 months  You will receive a reminder letter in the mail two months in advance. If you don't receive a letter, please call our office to schedule the follow-up appointment.   Your physician recommends that you return for a FASTING lipid profile: today

## 2011-07-07 ENCOUNTER — Other Ambulatory Visit: Payer: Self-pay

## 2011-07-07 NOTE — Telephone Encounter (Signed)
[facility] faxed request Hydrocodone/APAP 5-500 mg. Pt last seen 01/24/11.Please advise.

## 2011-07-08 ENCOUNTER — Emergency Department (HOSPITAL_COMMUNITY)
Admission: EM | Admit: 2011-07-08 | Discharge: 2011-07-08 | Disposition: A | Discharge status: Home / Self Care | Payer: Medicare Other | Attending: Emergency Medicine | Admitting: Emergency Medicine

## 2011-07-08 ENCOUNTER — Ambulatory Visit (INDEPENDENT_AMBULATORY_CARE_PROVIDER_SITE_OTHER): Payer: Medicare Other | Admitting: Family Medicine

## 2011-07-08 ENCOUNTER — Encounter: Payer: Self-pay | Admitting: Family Medicine

## 2011-07-08 ENCOUNTER — Encounter (HOSPITAL_COMMUNITY): Payer: Self-pay | Admitting: *Deleted

## 2011-07-08 ENCOUNTER — Other Ambulatory Visit: Payer: Self-pay | Admitting: Family Medicine

## 2011-07-08 VITALS — BP 150/80 | HR 108 | Temp 98.4°F | Wt 139.1 lb

## 2011-07-08 DIAGNOSIS — I2510 Atherosclerotic heart disease of native coronary artery without angina pectoris: Secondary | ICD-10-CM | POA: Insufficient documentation

## 2011-07-08 DIAGNOSIS — E119 Type 2 diabetes mellitus without complications: Secondary | ICD-10-CM | POA: Diagnosis not present

## 2011-07-08 DIAGNOSIS — Z79899 Other long term (current) drug therapy: Secondary | ICD-10-CM | POA: Diagnosis not present

## 2011-07-08 DIAGNOSIS — I10 Essential (primary) hypertension: Secondary | ICD-10-CM | POA: Diagnosis not present

## 2011-07-08 DIAGNOSIS — I83899 Varicose veins of unspecified lower extremities with other complications: Secondary | ICD-10-CM | POA: Insufficient documentation

## 2011-07-08 DIAGNOSIS — D689 Coagulation defect, unspecified: Secondary | ICD-10-CM | POA: Insufficient documentation

## 2011-07-08 DIAGNOSIS — I83893 Varicose veins of bilateral lower extremities with other complications: Secondary | ICD-10-CM

## 2011-07-08 DIAGNOSIS — R58 Hemorrhage, not elsewhere classified: Secondary | ICD-10-CM | POA: Diagnosis not present

## 2011-07-08 LAB — CBC
HCT: 33.7 % — ABNORMAL LOW (ref 36.0–46.0)
Hemoglobin: 10.2 g/dL — ABNORMAL LOW (ref 12.0–15.0)
MCH: 23.4 pg — ABNORMAL LOW (ref 26.0–34.0)
MCHC: 30.3 g/dL (ref 30.0–36.0)
MCV: 77.3 fL — ABNORMAL LOW (ref 78.0–100.0)
Platelets: 308 K/uL (ref 150–400)
RBC: 4.36 MIL/uL (ref 3.87–5.11)
RDW: 17.7 % — ABNORMAL HIGH (ref 11.5–15.5)
WBC: 7.9 10*3/uL (ref 4.0–10.5)

## 2011-07-08 MED ORDER — LIDOCAINE-EPINEPHRINE (PF) 2 %-1:200000 IJ SOLN
10.0000 mL | Freq: Once | INTRAMUSCULAR | Status: AC
  Administered 2011-07-08: 10 mL

## 2011-07-08 MED ORDER — LIDOCAINE-EPINEPHRINE-TETRACAINE (LET) SOLUTION: 3.0000 mL | Freq: Once | NASAL | Status: DC

## 2011-07-08 MED ORDER — HYDROCODONE-ACETAMINOPHEN 5-500 MG PO TABS: 0.5000 | ORAL_TABLET | Freq: Four times a day (QID) | ORAL | Status: DC | PRN

## 2011-07-08 MED ORDER — "THROMBI-PAD 3""X3"" EX PADS"
1.0000 | MEDICATED_PAD | Freq: Once | CUTANEOUS | Status: AC
  Administered 2011-07-08: 1 via TOPICAL

## 2011-07-08 MED ORDER — "THROMBI-PAD 3""X3"" EX PADS"
MEDICATED_PAD | CUTANEOUS | Status: AC
  Filled 2011-07-08: qty 1

## 2011-07-08 NOTE — Telephone Encounter (Signed)
Rx called in as directed.   

## 2011-07-08 NOTE — Progress Notes (Signed)
Subjective:    [name], female    DOB: 19-Nov-1927, 78 y.o.   MRN: [id number]  HPI  78 yo here for left leg bleeding.  Took her panty hose off last night and leg started to squirt out- used 5 big towels. Called EMS- EMS placed tourniquet and advised going to ER but pt refused. She is here today for re evaluation. She is tired and dizzy.  She is not taking coumadin. Takes ASA 81 mg daily.  Patient Active Problem List  Diagnoses  . Coronary artery disease  . Anxiety  . Obesity  . Hypertension  . Diastolic dysfunction  . Anemia  . Diabetes mellitus  . Hyperlipidemia  . Skin rash  . Abdominal  pain, other specified site  . Bleeding from varicose vein   Past Medical History  Diagnosis Date  . Hypertension   . Coronary artery disease     CABG 1998  . Anxiety   . Diastolic dysfunction   . Hyperlipidemia   . Peptic ulcer disease   . Anemia   . Depression   . Diabetes mellitus     type 2  . Diverticulosis   . Hemorrhoids   . History of heart attack    Past Surgical History  Procedure Date  . Tympanoplasty 1960s    R side  . Coronary artery bypass graft 1998  . Abdominal hysterectomy     TAH,BSO  . Cataract surgery   . Cholecystectomy   . Uterine prolapse surgery 2012    [facility]   . Appendectomy     per pt report  . Colonoscopy 02/2010    extensive diverticulosis throughout, small internal hemorrhoids, rec rpt 5 yrs (Dr. Allyn Kenner)  . Hand surgery   . Lexiscan myoview 03/2011    Small basal inferolateral and anterolateral reversible perfusion defect suggests ischemia.  EF was normal.   old infarct, no new ischemia   History  Substance Use Topics  . Smoking status: Never Smoker   . Smokeless tobacco: Never Used  . Alcohol Use: No   Family History  Problem Relation Age of Onset  . Hypertension Mother   . Stroke Mother   . Diabetes Father   . Diabetes Sister   . Diabetes Brother   . Colon cancer Neg Hx    Allergies  Allergen  Reactions  . Augmentin Diarrhea   Current Outpatient Prescriptions on File Prior to Visit  Medication Sig Dispense Refill  . ALPRAZolam (XANAX) 1 MG tablet Take 1 mg by mouth. Takes 1/2 tablet by mouth up to four times daily      . Ascorbic Acid (VITAMIN C PO) Take 1 tablet by mouth daily.        Marland Kitchen aspirin 81 MG tablet Take 81 mg by mouth 2 (two) times daily.       Marland Kitchen esomeprazole (NEXIUM) 40 MG capsule Take 1 capsule (40 mg total) by mouth daily.  30 capsule  3  . Estradiol (ESTRACE PO) Take 1 tablet by mouth daily.        Marland Kitchen glucosamine-chondroitin 500-400 MG tablet Take 1 tablet by mouth daily.        Marland Kitchen HYDROcodone-acetaminophen (VICODIN) 5-500 MG per tablet Take 0.5 tablets by mouth every 6 (six) hours as needed for pain.  60 tablet  0  . metoprolol tartrate (LOPRESSOR) 25 MG tablet Take 1 tablet (25 mg total) by mouth 2 (two) times daily.  60 tablet  11  . Multiple Vitamin (MULTIVITAMIN)  tablet Take 1 tablet by mouth daily.        . nitroGLYCERIN (NITROSTAT) 0.4 MG SL tablet Place 1 tablet (0.4 mg total) under the tongue every 5 (five) minutes as needed for chest pain. May take up to 3 doses, if pain persists call 911.  25 tablet  3  . nystatin-triamcinolone (MYCOLOG II) cream APPLY TO AFFECTED AREA DAILY  60 g  1  . pioglitazone (ACTOS) 45 MG tablet Take 45 mg by mouth daily.        . pravastatin (PRAVACHOL) 20 MG tablet Take 1 tablet (20 mg total) by mouth daily.  30 tablet  12  . ramipril (ALTACE) 5 MG tablet Take 5 mg by mouth daily.       . traZODone (DESYREL) 150 MG tablet Take 150 mg by mouth at bedtime.        . triamterene-hydrochlorothiazide (DYAZIDE) 37.5-25 MG per capsule Take 1 capsule by mouth every morning. DOSE UNKNOWN  30 capsule  6  . VITAMIN E PO Take 1 tablet by mouth daily.         The PMH, PSH, Social History, Family History, Medications, and allergies have been reviewed in [facility], and have been updated if relevant.  Review of Systems See HPI     Objective:    Physical Exam BP 150/80  Pulse 108  Temp(Src) 98.4 F (36.9 C) (Oral)  Wt 139 lb 1.9 oz (63.104 kg)  SpO2 98%  Left leg: As soon as bandage was removed, varicose vein started squirting blood again, unable to visualize. +faint left pedal pulse     Assessment & Plan:   1. Bleeding from varicose vein    New- see above.  Hemodynamically stable. Replaced tourniquet and pt transported to ER via EMS.

## 2011-07-08 NOTE — ED Notes (Signed)
Had vericose vein to LLE rupture & bleed this am. [name] to PCP later this morning where they took off drsg & bleeding restarted. EMS states unable to palpate left pedal pulse. CSM intact, denies pain. Presently bleeding controlled

## 2011-07-08 NOTE — ED Notes (Signed)
Drsg dry & intact. No bleeding noted

## 2011-07-08 NOTE — ED Notes (Signed)
Drsg removed per Dr. Dorna Mai, pt stood to go to bathroom & started to bleed again. Thrombi pad & drsg placed to site & pressure held.

## 2011-07-08 NOTE — ED Provider Notes (Addendum)
History     CSN: [id number]  Arrival date & time 07/08/11  1233   First MD Initiated Contact with Patient 07/08/11 1304      Chief Complaint  Patient presents with  . Coagulation Disorder    (Consider location/radiation/quality/duration/timing/severity/associated sxs/prior treatment) HPI Comments: Patient reports early this morning around 3, the patient was taking off her hose and something must of injured her left lower leg just above her ankle area on the medial side and she started bleeding. The patient does take baby aspirin and took 2 of them today. She reports that she was having severe difficulty trying to get the bleeding to stop. She reports that the floor of her bathroom and kitchen was covered in blood. She reports she is not on Coumadin or Plavix. She eventually was able to get to a bandage and she coughed a strip and tied it around her ankle which appears to have stopped the bleeding. She was seen previously at a different facility and told to come here to the emergency department. Patient denies any significant pain. She denies feeling weak or dizzy. She denies chest pain or shortness of breath.   The history is provided by the patient.    Past Medical History  Diagnosis Date  . Hypertension   . Coronary artery disease     CABG 1998  . Anxiety   . Diastolic dysfunction   . Hyperlipidemia   . Peptic ulcer disease   . Anemia   . Depression   . Diabetes mellitus     type 2  . Diverticulosis   . Hemorrhoids   . History of heart attack     Past Surgical History  Procedure Date  . Tympanoplasty 1960s    R side  . Coronary artery bypass graft 1998  . Abdominal hysterectomy     TAH,BSO  . Cataract surgery   . Cholecystectomy   . Uterine prolapse surgery 2012    [facility]   . Appendectomy     per pt report  . Colonoscopy 02/2010    extensive diverticulosis throughout, small internal hemorrhoids, rec rpt 5 yrs (Dr. Allyn Kenner)  . Hand surgery   . Lexiscan  myoview 03/2011    Small basal inferolateral and anterolateral reversible perfusion defect suggests ischemia.  EF was normal.   old infarct, no new ischemia    Family History  Problem Relation Age of Onset  . Hypertension Mother   . Stroke Mother   . Diabetes Father   . Diabetes Sister   . Diabetes Brother   . Colon cancer Neg Hx     History  Substance Use Topics  . Smoking status: Never Smoker   . Smokeless tobacco: Never Used  . Alcohol Use: No    OB History    Grav Para Term Preterm Abortions TAB SAB Ect Mult Living                  Review of Systems  Constitutional: Negative.   Skin: Positive for wound.  Neurological: Negative for dizziness, syncope and light-headedness.    Allergies  Augmentin  Home Medications   Current Outpatient Rx  Name Route Sig Dispense Refill  . ALPRAZOLAM 1 MG PO TABS Oral Take 0.5 mg by mouth 4 (four) times daily as needed. For anxiety    . VITAMIN C PO Oral Take 1 tablet by mouth daily.     . ASPIRIN 81 MG PO TABS Oral Take 81 mg by mouth 2 (two) times  daily.     Marland Kitchen CALCIUM PO Oral Take 1 tablet by mouth daily.    Marland Kitchen VITAMIN B 12 PO Oral Take 1 tablet by mouth daily.    Marland Kitchen ESOMEPRAZOLE MAGNESIUM 40 MG PO CPDR Oral Take 40 mg by mouth daily.    Marland Kitchen ESTRACE PO Oral Take 1 tablet by mouth daily.     Marland Kitchen EST ESTROGENS-METHYLTEST 1.25-2.5 MG PO TABS Oral Take 1 tablet by mouth daily.    Marland Kitchen GLUCOSAMINE-CHONDROITIN 500-400 MG PO TABS Oral Take 1 tablet by mouth daily.      Marland Kitchen HYDROCODONE-ACETAMINOPHEN 5-500 MG PO TABS Oral Take 0.5 tablets by mouth every 6 (six) hours as needed. For pain    . METOPROLOL TARTRATE 25 MG PO TABS Oral Take 25 mg by mouth 2 (two) times daily.    Marland Kitchen ONE-DAILY MULTI VITAMINS PO TABS Oral Take 1 tablet by mouth daily.      Marland Kitchen NITROGLYCERIN 0.4 MG SL SUBL Sublingual Place 0.4 mg under the tongue every 5 (five) minutes as needed. For chest pain May take up to 3 doses, if pain persists call 911.    Marland Kitchen NYSTATIN-TRIAMCINOLONE  100000-0.1 UNIT/GM-% EX CREA Topical Apply 1 application topically 4 (four) times daily. vaginally    . PIOGLITAZONE HCL 45 MG PO TABS Oral Take 45 mg by mouth daily.      Marland Kitchen PRAVASTATIN SODIUM 20 MG PO TABS Oral Take 20 mg by mouth daily.    Marland Kitchen RAMIPRIL 10 MG PO CAPS Oral Take 10 mg by mouth daily.    . TRAZODONE HCL 150 MG PO TABS Oral Take 150 mg by mouth at bedtime.      . TRIAMTERENE-HCTZ 37.5-25 MG PO CAPS Oral Take 1 capsule by mouth daily.     Marland Kitchen VITAMIN E PO Oral Take 1 tablet by mouth daily.       BP 146/69  Pulse 81  Temp(Src) 98.2 F (36.8 C) (Oral)  Resp 22  Wt 153 lb (69.4 kg)  SpO2 97%  Physical Exam  Nursing note and vitals reviewed. Constitutional: She appears well-developed and well-nourished. No distress.  Abdominal: Soft.  Musculoskeletal:       Left ankle: She exhibits normal range of motion.       Feet:       1+ edema present in both LE  Skin: Skin is warm.    ED Course  LACERATION REPAIR Date/Time: 07/08/2011 3:38 PM Performed by: Donzetta Matters Authorized by: Donzetta Matters Consent: Verbal consent obtained. Risks and benefits: risks, benefits and alternatives were discussed Consent given by: patient Patient understanding: patient states understanding of the procedure being performed Patient identity confirmed: verbally with patient Time out: Immediately prior to procedure a "time out" was called to verify the correct patient, procedure, equipment, support staff and site/side marked as required. Body area: lower extremity Location details: left ankle Laceration length: 0.5 cm Tendon involvement: none Nerve involvement: none Anesthesia: local infiltration Local anesthetic: lidocaine 2% with epinephrine Anesthetic total: 1 ml Patient sedated: no Preparation: Patient was prepped and draped in the usual sterile fashion. Irrigation solution: saline Amount of cleaning: standard Patient tolerance: Patient tolerated the procedure well with no  immediate complications. Comments: Due to extensive bleeding from varicose vein, Bovie was used to cauterize  Skin tear with hemostasis achieved.  Vaseline gauze applied and wrapped with gauze.     (including critical care time)  Labs Reviewed  CBC - Abnormal; Notable for the following:    Hemoglobin  10.2 (*)    HCT 33.7 (*)    MCV 77.3 (*)    MCH 23.4 (*)    RDW 17.7 (*)    All other components within normal limits   No results found.   1. Bleeding from varicose veins of lower extremity       MDM  With holding pressure, the bleeding would stop. However it even with holding pressure when I stood her up for her to walk to the bathroom, it started hemorrhaging again. It appears deep to her thin skin and the fact that she may have injured a varicose vein underneath her skin, blood whirlpool underneath her skin and then run out. This is not an arterial bleed. Her foot color and Refill are within normal limits. Unable to palpate a PT and DP pulse. Neurologically, she has intact gross sensation and muscle strength. A from a pad was used and hemostasis again achieved, however when the dressing was removed, the clot came free and it started oozing blood again. At this point my decision was to anesthetize the area with some lidocaine with epinephrine and Bovie cautery was used to seal the skin. I've applied Vaseline coated Xeroform and will continue to wrap the wound. I think this. The bleeding. Her hemoglobin is close to her baseline and she is not dizzy and her blood pressure and heart rate have remained stable here in emergency department. She may followup with her primary care physician on Monday for recheck. I've instructed her to hold off on her aspirin over the next few days.        Saddie Benders. Dorna Mai, MD 07/08/11 Curran Ivie Maese, MD 07/08/11 1540

## 2011-07-08 NOTE — Telephone Encounter (Signed)
Noted thanks °

## 2011-07-08 NOTE — Telephone Encounter (Signed)
plz phone in. 

## 2011-07-08 NOTE — Telephone Encounter (Signed)
Caller: Enrika/Patient; PCP: Ria Bush; CB#: [phone]; ; ; Call regarding Busted Blood Vessel Last Night, EMS Bandaged Last Night But Needs Appt; ankle began "spurting blood," and she called 911.  The EMS folks bandaged it, but is afraid to take off the bandage.  Wants appt to have it checked.  Mildly tender to touch.  Per protocol, emergent symptoms denied; advised appt within 24 hours.  Appt sched [id number] 07/08/11 with Dr. Deborra Medina.

## 2011-07-08 NOTE — Discharge Instructions (Signed)
Please hold off on taking aspirin or other blood thinning medications like ibuprofen or aleve for the next 3 days.  Follow up with your own physician on Monday to ensure that bleeding has completely stopped.

## 2011-07-11 ENCOUNTER — Ambulatory Visit (INDEPENDENT_AMBULATORY_CARE_PROVIDER_SITE_OTHER): Payer: Medicare Other | Admitting: Family Medicine

## 2011-07-11 ENCOUNTER — Encounter: Payer: Self-pay | Admitting: Family Medicine

## 2011-07-11 ENCOUNTER — Telehealth: Payer: Self-pay | Admitting: Family Medicine

## 2011-07-11 ENCOUNTER — Telehealth: Payer: Self-pay

## 2011-07-11 VITALS — BP 124/64 | HR 84 | Temp 98.6°F | Ht <= 58 in | Wt 152.2 lb

## 2011-07-11 DIAGNOSIS — I83893 Varicose veins of bilateral lower extremities with other complications: Secondary | ICD-10-CM

## 2011-07-11 DIAGNOSIS — I83899 Varicose veins of unspecified lower extremities with other complications: Secondary | ICD-10-CM

## 2011-07-11 DIAGNOSIS — D649 Anemia, unspecified: Secondary | ICD-10-CM

## 2011-07-11 LAB — IBC PANEL
Saturation Ratios: 3.1 % — ABNORMAL LOW (ref 20.0–50.0)
Transferrin: 386.8 mg/dL — ABNORMAL HIGH (ref 212.0–360.0)

## 2011-07-11 LAB — CBC WITH DIFFERENTIAL/PLATELET
Basophils Absolute: 0.1 10*3/uL (ref 0.0–0.1)
Basophils Relative: 1 % (ref 0.0–3.0)
Hemoglobin: 10.6 g/dL — ABNORMAL LOW (ref 12.0–15.0)
Lymphocytes Relative: 14.3 % (ref 12.0–46.0)
Monocytes Relative: 8.5 % (ref 3.0–12.0)
Neutro Abs: 6.7 10*3/uL (ref 1.4–7.7)
RBC: 4.32 Mil/uL (ref 3.87–5.11)
RDW: 19.2 % — ABNORMAL HIGH (ref 11.5–14.6)

## 2011-07-11 NOTE — Assessment & Plan Note (Signed)
H/o this in past, Hgb dropped to 10.2 Pt denies sxs.   Will recehck today to ensure stable, check iron panel. No b12 or folate in chart.  COLONOSCOPY Date: 02/2010 extensive diverticulosis throughout, small internal hemorrhoids, rec rpt 5 yrs (Dr. Allyn Kenner)

## 2011-07-11 NOTE — Telephone Encounter (Signed)
This telephone call is continuation of [facility] message for today. Pt needs f/u appt from ER visit. Dr Danise Mina said would see pt today at 12:15.pm.

## 2011-07-11 NOTE — Assessment & Plan Note (Addendum)
Seems like bled from reticulated vein. Reviewed records from last OV as well as ER. Looked at site - seems fully healed and closed. Cleaned site. Had Kim placed vaseline gauze on site, advised to keep in place for 2-3 days. Off ASA for next 4-5 days, then may restart but only start at 81mg  (prior on 160mg  daily). Discussed referral to vein specialist to discuss possible vein therapies, pt hesitant and states wants to discuss with endo first.

## 2011-07-11 NOTE — Patient Instructions (Signed)
Blood work today.  Will check this to see if need iron. Hold aspirin for another 5 days then may restart on Friday, but only start at 1 pill a day. Use vaseline pad for next 2-3 days, then may remove. Good to see you today, call us with questions.

## 2011-07-11 NOTE — Telephone Encounter (Signed)
Will see at 1215.

## 2011-07-11 NOTE — Progress Notes (Signed)
  Subjective:    [name], female    DOB: [birth year], 78 y.o.   MRN: [id number]  HPI CC: f/u ER  Thursday night LLE blood started spurting from varicose vein after taking off compression stockings, may have grazed reticular vein with nail.  Significant bleed but that night finally able to stop bleed (called EMS but pt refused ER eval).  Came into office on Friday but bleeding continued so sent to ER where bleed was cauterized after lidocaine w/ epi, placed thrombipad on leg and wrapped.  Told not to remove until eval by PCP today.  Denies dizziness throughout this episode.  All records reviewed. Hgb 10.2.  Known microcytic anemia.  Never on iron replacement.  Significant anxiety in general, and also stemming from this episode.  Has appt with Dr. Dwyane Dee (endo) next Monday.  Pt states not taking metoprolol - and never took this in past.  Removed from list.  Past Medical History  Diagnosis Date  . Hypertension   . Coronary artery disease     CABG 1998  . Anxiety   . Diastolic dysfunction   . Hyperlipidemia   . Peptic ulcer disease   . Anemia   . Depression   . Diabetes mellitus     type 2  . Diverticulosis   . Hemorrhoids   . History of heart attack     Review of Systems Per HPI    Objective:   Physical Exam  Nursing note and vitals reviewed. Constitutional: She appears well-developed and well-nourished. No distress.  Musculoskeletal: She exhibits edema (mild edema BLE).       Multiple reticulated veins bilateral feet, medial side worse. Several varicose veins throughout legs. Left medial ankle with scabbed lesion, no erythema, drainage.  Dry.  Skin: Skin is warm and dry. Rash noted.       Dry scaly skin L medial ankle  Psychiatric: Her mood appears anxious. She does not exhibit a depressed mood.       Assessment & Plan:

## 2011-07-11 NOTE — Telephone Encounter (Signed)
Caller: [name]/Patient; PCP: [name]; CB#: [phone];  Calling 07/07/11.  Had blood vein that ruptured on 07/07/11.  Called 911.  [name] on Friday and MD sent her straight to ED.  Was told at ED to follow up with her family doctor today.  Has bandage on left ankle which is clean and dry.  Was told DO NOT remove bandage until she is seen in office today.  Calling to make appt to come in today to have MD remove bandage and check ankle.  Pt requesting to see [name] instead of [name], since [name] is her regular doctor.  Advised pt MD does not have any appts until around 3:30 PM this evening, offered to schedule her with [name] at 12:15 PM, pt refused.  Insisting message be sent to MD to see if she can be worked in b/c she can't take this bandage off until MD sees her today.  PLEASE CALL PT BACK TO SEE IF [name] CAN WORK HER IN THIS AM TO COME IN AND REMOVE BANDAGE FROM ANKLE.  PT SAYS IS VERY IMPORTANT THAT SHE SEES [name] THIS AM.  PLEASE CALL BACK ASAP [phone] TO ADVISE.  AGAIN, PT REFUSED THE EARLIER APPT WITH DR. ARON AT 12:15 PM.

## 2011-07-12 ENCOUNTER — Other Ambulatory Visit: Payer: Self-pay | Admitting: Family Medicine

## 2011-07-12 MED ORDER — FERROUS SULFATE 325 (65 FE) MG PO TABS: 325.0000 mg | ORAL_TABLET | Freq: Every day | ORAL | Status: DC

## 2011-07-18 DIAGNOSIS — E7800 Pure hypercholesterolemia, unspecified: Secondary | ICD-10-CM | POA: Diagnosis not present

## 2011-07-18 DIAGNOSIS — E119 Type 2 diabetes mellitus without complications: Secondary | ICD-10-CM | POA: Diagnosis not present

## 2011-07-18 DIAGNOSIS — I10 Essential (primary) hypertension: Secondary | ICD-10-CM | POA: Diagnosis not present

## 2011-07-25 ENCOUNTER — Telehealth: Payer: Self-pay | Admitting: Family Medicine

## 2011-07-25 NOTE — Telephone Encounter (Signed)
[name] got a shingles shot and was billed over $200.00. She has Medicare and [employer]. She is going to call the 2 insurance companies and see what she has to do for this to be paid.

## 2011-07-26 ENCOUNTER — Telehealth: Payer: Self-pay | Admitting: Family Medicine

## 2011-07-26 NOTE — Telephone Encounter (Signed)
Caller: [name]/Patient; PCP: Ria Bush; CB#: [phone]; Call regarding Blood Vein (varicose vein) Busted in Ankle 07/07/11;  States woke up from sleeping and notes "fresh" blood on bandaid with mild bleeding from site.   Asking what should be done.  States is going to take a bath now and put on fresh bandaid; asking what else she needs to do.  Takes ASA qd.  Refused to follow instructions to hold bleeding area for 10 min to stop oozing.  Advised to see MD within 72 hrs fpr prolonged bleeding from minor cuts, nicks etc per Abrasions lacerations Puncture Wounds Guideline. Did not want to schedule now.  Advised to call scheduler when ready for appt.

## 2011-07-26 NOTE — Telephone Encounter (Signed)
Agee with compression if bleeding until stopped.  plz call to f/u on how she's doing.  Also see if willing to see vein doctors for continued bleeding.

## 2011-07-27 NOTE — Telephone Encounter (Signed)
Spoke with patient. No more bleeding today. She wrapped leg yesterday and it has done fine. Advised if it starts back and she can't get bleeding controlled to call 911. She verbalized understanding.

## 2011-07-27 NOTE — Telephone Encounter (Signed)
Noted. Thanks.

## 2011-07-29 ENCOUNTER — Telehealth: Payer: Self-pay | Admitting: Family Medicine

## 2011-07-29 NOTE — Telephone Encounter (Signed)
Pt dropped off insurance form about vaccines. I put in in your Inbox on your desk.

## 2011-08-01 ENCOUNTER — Emergency Department (HOSPITAL_COMMUNITY)
Admission: EM | Admit: 2011-08-01 | Discharge: 2011-08-01 | Disposition: A | Discharge status: Home / Self Care | Payer: Medicare Other | Attending: Emergency Medicine | Admitting: Emergency Medicine

## 2011-08-01 ENCOUNTER — Encounter (HOSPITAL_COMMUNITY): Payer: Self-pay | Admitting: Emergency Medicine

## 2011-08-01 ENCOUNTER — Telehealth: Payer: Self-pay | Admitting: Family Medicine

## 2011-08-01 DIAGNOSIS — D649 Anemia, unspecified: Secondary | ICD-10-CM | POA: Diagnosis not present

## 2011-08-01 DIAGNOSIS — E119 Type 2 diabetes mellitus without complications: Secondary | ICD-10-CM | POA: Diagnosis not present

## 2011-08-01 DIAGNOSIS — I83892 Varicose veins of left lower extremities with other complications: Secondary | ICD-10-CM

## 2011-08-01 DIAGNOSIS — I10 Essential (primary) hypertension: Secondary | ICD-10-CM | POA: Diagnosis not present

## 2011-08-01 DIAGNOSIS — Z7982 Long term (current) use of aspirin: Secondary | ICD-10-CM | POA: Diagnosis not present

## 2011-08-01 DIAGNOSIS — E785 Hyperlipidemia, unspecified: Secondary | ICD-10-CM | POA: Diagnosis not present

## 2011-08-01 DIAGNOSIS — I2510 Atherosclerotic heart disease of native coronary artery without angina pectoris: Secondary | ICD-10-CM | POA: Diagnosis not present

## 2011-08-01 DIAGNOSIS — R6889 Other general symptoms and signs: Secondary | ICD-10-CM | POA: Diagnosis not present

## 2011-08-01 DIAGNOSIS — I868 Varicose veins of other specified sites: Secondary | ICD-10-CM | POA: Insufficient documentation

## 2011-08-01 DIAGNOSIS — Z79899 Other long term (current) drug therapy: Secondary | ICD-10-CM | POA: Diagnosis not present

## 2011-08-01 DIAGNOSIS — I83893 Varicose veins of bilateral lower extremities with other complications: Secondary | ICD-10-CM | POA: Diagnosis not present

## 2011-08-01 MED ORDER — FERROUS SULFATE 324 (65 FE) MG PO TBEC: 1.0000 | DELAYED_RELEASE_TABLET | Freq: Every day | ORAL | Status: DC

## 2011-08-01 NOTE — ED Provider Notes (Signed)
History     CSN: [id number]  Arrival date & time 08/01/11  1052   First MD Initiated Contact with Patient 08/01/11 1248      Chief Complaint  Patient presents with  . Leg Pain    (Consider location/radiation/quality/duration/timing/severity/associated sxs/prior treatment) HPI Pt with bleeding from varicose vein this morning which is now resolved. Pt seen previously in this ED for same complaint. Pt admits to being on her feet extensively and not wearing compression hose. No pain or swelling Past Medical History  Diagnosis Date  . Hypertension   . Coronary artery disease     CABG 1998  . Anxiety   . Diastolic dysfunction   . Hyperlipidemia   . Peptic ulcer disease   . Anemia   . Depression   . Diabetes mellitus     type 2, followed by endo.  . Diverticulosis   . Hemorrhoids   . History of heart attack     Past Surgical History  Procedure Date  . Tympanoplasty 1960s    R side  . Coronary artery bypass graft 1998  . Abdominal hysterectomy     TAH,BSO  . Cataract surgery   . Cholecystectomy   . Uterine prolapse surgery 2012    [facility]   . Appendectomy     per pt report  . Colonoscopy 02/2010    extensive diverticulosis throughout, small internal hemorrhoids, rec rpt 5 yrs (Dr. Allyn Kenner)  . Hand surgery   . Lexiscan myoview 03/2011    Small basal inferolateral and anterolateral reversible perfusion defect suggests ischemia.  EF was normal.   old infarct, no new ischemia    Family History  Problem Relation Age of Onset  . Hypertension Mother   . Stroke Mother   . Diabetes Father   . Diabetes Sister   . Diabetes Brother   . Colon cancer Neg Hx     History  Substance Use Topics  . Smoking status: Never Smoker   . Smokeless tobacco: Never Used  . Alcohol Use: No    OB History    Grav Para Term Preterm Abortions TAB SAB Ect Mult Living                  Review of Systems  Constitutional: Negative for fever and chills.  Musculoskeletal: Negative  for myalgias and joint swelling.  Skin: Positive for wound.    Allergies  Augmentin  Home Medications   Current Outpatient Rx  Name Route Sig Dispense Refill  . ALPRAZOLAM 1 MG PO TABS Oral Take 0.5 mg by mouth 4 (four) times daily as needed. For anxiety    . VITAMIN C PO Oral Take 1 tablet by mouth daily.     . ASPIRIN 81 MG PO TABS Oral Take 81 mg by mouth daily.     Marland Kitchen CALCIUM PO Oral Take 1 tablet by mouth daily.    Marland Kitchen VITAMIN B 12 PO Oral Take 1 tablet by mouth daily.    Marland Kitchen ESOMEPRAZOLE MAGNESIUM 40 MG PO CPDR Oral Take 40 mg by mouth daily.    Marland Kitchen ESTRACE PO Oral Take 1 tablet by mouth daily.     Marland Kitchen GLUCOSAMINE-CHONDROITIN 500-400 MG PO TABS Oral Take 1 tablet by mouth daily.      Marland Kitchen HYDROCODONE-ACETAMINOPHEN 5-500 MG PO TABS Oral Take 0.5 tablets by mouth every 6 (six) hours as needed for pain. 60 tablet 0  . ONE-DAILY MULTI VITAMINS PO TABS Oral Take 1 tablet by mouth daily.      Marland Kitchen  NITROGLYCERIN 0.4 MG SL SUBL Sublingual Place 0.4 mg under the tongue every 5 (five) minutes as needed. For chest pain May take up to 3 doses, if pain persists call 911.    Marland Kitchen NYSTATIN-TRIAMCINOLONE 100000-0.1 UNIT/GM-% EX CREA Topical Apply 1 application topically 4 (four) times daily. vaginally    . PIOGLITAZONE HCL 45 MG PO TABS Oral Take 45 mg by mouth daily.      Marland Kitchen POLYETHYLENE GLYCOL 3350 PO PACK Oral Take 17 g by mouth daily.    Marland Kitchen PRAVASTATIN SODIUM 20 MG PO TABS Oral Take 20 mg by mouth daily.    Marland Kitchen RAMIPRIL 10 MG PO CAPS Oral Take 10 mg by mouth daily.    . TRAZODONE HCL 150 MG PO TABS Oral Take 150 mg by mouth at bedtime.      . TRIAMTERENE-HCTZ 37.5-25 MG PO CAPS  1/2 tab by mouth daily.    Marland Kitchen VITAMIN E PO Oral Take 1 tablet by mouth daily.     Marland Kitchen FERROUS SULFATE 324 (65 FE) MG PO TBEC Oral Take 1 tablet by mouth daily. 30 tablet     BP 148/63  Pulse 81  Temp(Src) 97.8 F (36.6 C) (Oral)  Resp 16  SpO2 98%  Physical Exam  Nursing note and vitals reviewed. Constitutional: She is oriented to  person, place, and time. She appears well-developed and well-nourished.  Musculoskeletal: Normal range of motion. She exhibits no edema and no tenderness.       Pt with extensive varicose disease. On lower medial L ankle crust formation of previous site of bleeding. No active bleeding or tenderness.   Neurological: She is alert and oriented to person, place, and time.  Skin: Rash noted.       Pt has scaly patch of skin on R medial ankle. No erythema or evidence of infection    ED Course  Procedures (including critical care time)  Labs Reviewed - No data to display No results found.   1. Bleeding from varicose vein, left       MDM  F/u with dermatology for dermatitis and vein specialist for sclerotherapy for persistent varicose complaints. Pt encouraged to keep legs elevated and wear compression stockings. Return for uncontrolled bleeding        Julianne Rice, MD 08/01/11 1355

## 2011-08-01 NOTE — Telephone Encounter (Signed)
Filled form.  plz have front office fill out highlighted areas then patient needs to fill out rest of form.

## 2011-08-01 NOTE — Telephone Encounter (Signed)
Pts son, Magin Michon presented at our office without pt. At approx 8:45am. This AM lt leg began to bleed again. Leg wrapped and bleeding stopped per son. Advised son to call 911, go to [facility] or ER. Kam with [facility] called at 9:45 am with pt calling that leg was oozing blood. Spoke with Martin Majestic and he advised pt wanted to eat before seeking help. Kam advised. I called pt at home and she said lt leg was oozing blood with bandage on, pain in lt leg (pain level 2) and pt gave permission to call 911. 911 notified and dispatched. I stayed on line with pt advising to put pressure to area bleeding. Son arrived; advised 911 called and need to keep pressure on leg, take current meds  to ER, unlock front door and put away pets. Son and pt advised verbal understanding.

## 2011-08-01 NOTE — Discharge Instructions (Signed)
Bleeding Varicose Veins Varicose veins are veins that have become enlarged and twisted. Valves in the veins help return blood from the leg to the heart. If these valves are damaged, blood flows backwards and backs up into the veins in the leg near the skin. This causes the veins to become larger because of increased pressure within. Sometimes these veins bleed. CAUSES  Factors that can lead to bleeding varicose veins include:  Thinning of the skin that covers the veins. This skin is stretched as the veins enlarge.   Weak and thinning walls of the varicose veins. These thin walls are part of the reason why blood is not flowing normally to the heart.   Having high pressure in the veins. This high pressure occurs because the blood is not flowing freely back up to the heart.   Injury. Even a small injury to a varicose vein can cause bleeding.   Open wounds. A sore may develop near a varicose vein and not heal. This makes bleeding more likely.   Taking medicine that thins the blood. These medicines may include aspirin, anti-inflammatory medicine, and other blood thinners.  SYMPTOMS  If bleeding is on the outside surface of the skin, blood can be seen. Sometimes, the bleeding stays under the skin. If this happens, the blue or purple area will spread beyond the vein. This discoloration may be visible. DIAGNOSIS  To decide if you have a bleeding varicose vein, your caregiver may:  Ask about your symptoms. This will include when you first saw bleeding.   Ask about how long you have had varicose veins and if they cause you problems.   Ask about your overall health.   Ask about possible causes, like recent cuts or if the area near the varicose veins was bumped or injured.   Examine the skin or leg that concerns you. Your caregiver will probably feel the veins.   Order imaging tests. These create detailed pictures of the veins.  TREATMENT  The first goal of treating bleeding varicose veins is to  stop the bleeding. Then, the aim is to keep any bleeding from happening again. Treatment will depend on the cause of the bleeding and how bad it is. Ask your caregiver about what would be best for you. Options include:  Raising (elevating) your leg. Lie down with your leg propped up on a pillow or cushion. Your foot should be above your heart.   Applying pressure to the spot that is bleeding. The bleeding should stop in a short time.   Wearing elastic stocking that "compress" your legs (compression stockings). An elastic bandage may do the same thing.   Applying an antibiotic cream on sores that are not healing.   Surgically removing or closing off the bleeding varicose veins.  HOME CARE INSTRUCTIONS   Apply any creams that your caregiver prescribed. Follow the directions carefully.   Wear compression stockings or any special wraps that were prescribed. Make sure you know:   If you should wear them every day.   How long you should wear them.   If veins were removed or closed, a bandage (dressing) will probably cover the area. Make sure you know:   How often the dressing should be changed.   Whether the area can get wet.   When you can leave the skin uncovered.   Check your skin every day. Look for new sores and signs of bleeding.   To prevent future bleeding:   Use extra care in situations where  you could cut your legs. Shaving, for example, or working outside in the garden.   Try to keep your legs elevated as much as possible. Lie down when you can.  SEEK MEDICAL CARE IF:   You have any questions about how to wear compression stockings or elastic bandages.   Your veins continue to bleed.   Sores develop near your varicose veins.   You have a sore that does not heal or gets bigger.   Pain increases in your leg.   The area around a varicose vein becomes warm, red, or tender to the touch.   You notice a yellowish fluid that smells bad coming from a spot where there was  bleeding.   You develop a fever of more than 100.5 F (38.1 C).  SEEK IMMEDIATE MEDICAL CARE IF:   You develop a fever of more than 102 F (38.9 C).  Document Released: 08/07/2008 Document Revised: 03/10/2011 Document Reviewed: 05/17/2010 [facility] Patient Information 2012 [place].

## 2011-08-01 NOTE — Telephone Encounter (Signed)
Noted. Thanks.  Will await ER eval.  Pt in past had declined vein specialist referral.

## 2011-08-01 NOTE — ED Notes (Signed)
States has  Vein that strated to bleed on left lower leg states has been seen for same a couple of weeks ago

## 2011-08-01 NOTE — Telephone Encounter (Signed)
Patient aware. Form given to [place].

## 2011-08-01 NOTE — Telephone Encounter (Signed)
Caller: Ravleen/Patient; PCP: Ria Bush; CB#: [phone];  Call regarding Busted Vein in Lower Leg THE PATIENT REFUSED 911; Bleeding from L lower leg onset this AM. Son bandaged her leg but the bandage is still oozing blood. Pt advises she had blood all over her foot and on the floor. Advised call 911. She declines 911 as she doesn't have the money. Does not want to go to the ED. RN called and spoke to [facility] ofc nurse who advises son came by the ofc this AM to report on pt. He was told to call 911 but declined. He was then advised to take pt to UC or ED. He is on his way back to pt's house. RN advised pt of same. Repeated if she is having active bleeding to call 911 for an assessment which does not mean she will need transport. Pt declines again. She will wait for son to take her to the ED.

## 2011-08-04 ENCOUNTER — Ambulatory Visit (INDEPENDENT_AMBULATORY_CARE_PROVIDER_SITE_OTHER): Payer: Medicare Other | Admitting: Family Medicine

## 2011-08-04 ENCOUNTER — Encounter: Payer: Self-pay | Admitting: Family Medicine

## 2011-08-04 VITALS — BP 122/66 | HR 88 | Temp 98.1°F | Wt 152.8 lb

## 2011-08-04 DIAGNOSIS — R21 Rash and other nonspecific skin eruption: Secondary | ICD-10-CM | POA: Diagnosis not present

## 2011-08-04 DIAGNOSIS — I83893 Varicose veins of bilateral lower extremities with other complications: Secondary | ICD-10-CM

## 2011-08-04 DIAGNOSIS — I83899 Varicose veins of unspecified lower extremities with other complications: Secondary | ICD-10-CM

## 2011-08-04 NOTE — Assessment & Plan Note (Signed)
Anticipate xerosis. rec regular moisturizer use, may use OTC hydrocortisone cream.  rec stop neosporin. If not better, derm eval or more potent steroids.

## 2011-08-04 NOTE — Patient Instructions (Signed)
Pass by [name]'s office to schedule appointment with vein doctor to evaluate varicose veins. For dry skin on right leg- moisturizer throughout the day, continue hydrocortisone cream twice daily for next 2 weeks.  If worsening, please let me know.

## 2011-08-04 NOTE — Progress Notes (Addendum)
  Subjective:    [name], female    DOB: [birth year], 78 y.o.   MRN: [id number]  HPI CC: f/u L leg, check rash.  H/o bleeding from varicose and reticular veins on left lower medial leg, s/p ER eval x2, first for uncontrolled bleeding, second similar.  Records reviewed from ER- bleed was controlled by time pt arrived, no interventions performed.  I had discussed with pt prior about f/u with vein specialist, pt had been hesitant for referral, but given continued issue ready to seek further evaluation.  Occasional leg cramping but no claudication sxs.  Also wants rash evaluated.  Present for >1 mo.  Mildly pruritic.  Past Medical History  Diagnosis Date  . Hypertension   . Coronary artery disease     CABG 1998  . Anxiety   . Diastolic dysfunction   . Hyperlipidemia   . Peptic ulcer disease   . Anemia   . Depression   . Diabetes mellitus     type 2, followed by endo.  . Diverticulosis   . Hemorrhoids   . History of heart attack    Past Surgical History  Procedure Date  . Tympanoplasty 1960s    R side  . Coronary artery bypass graft 1998  . Abdominal hysterectomy     TAH,BSO  . Cataract surgery   . Cholecystectomy   . Uterine prolapse surgery 2012    AT [facility]   . Appendectomy     per pt report  . Colonoscopy 02/2010    extensive diverticulosis throughout, small internal hemorrhoids, rec rpt 5 yrs (Dr. Allyn Kenner)  . Hand surgery   . Lexiscan myoview 03/2011    Small basal inferolateral and anterolateral reversible perfusion defect suggests ischemia.  EF was normal.   old infarct, no new ischemia    Review of Systems Per HPI    Objective:   Physical Exam  Nursing note and vitals reviewed. Constitutional: She appears well-developed and well-nourished. No distress.  Musculoskeletal: She exhibits edema (mild edema BLE).       Multiple reticulated veins bilateral feet, medial side worse. Several varicose veins throughout legs. Left medial ankle with  scabbed lesion, no erythema, drainage.  Dry. Diminished DP/PT bilaterally L>R Hallux valgus deformity bilateral great toes  Skin: Skin is warm and dry. Rash noted.       Dry erythematous scaly rash L medial leg superior to ankle  Psychiatric: Her mood appears anxious. She does not exhibit a depressed mood.      Assessment & Plan:

## 2011-08-04 NOTE — Assessment & Plan Note (Signed)
Currently no active bleeding. 2nd vein bleeding episode requiring ER eval. Refer to vascular for further evaluation, discussion on treatment options. Pt agrees. Decreased pulses BLE, ?arterial insufficiency as well, will start with vein eval as pt very anxious and will do better with one intervention at a time.  Pt adamant about not wanting invasive surgery.

## 2011-08-05 ENCOUNTER — Other Ambulatory Visit: Payer: Self-pay | Admitting: *Deleted

## 2011-08-05 MED ORDER — HYDROCODONE-ACETAMINOPHEN 5-500 MG PO TABS: 0.5000 | ORAL_TABLET | Freq: Four times a day (QID) | ORAL | Status: DC | PRN

## 2011-08-05 NOTE — Telephone Encounter (Signed)
plz phone in. 

## 2011-08-05 NOTE — Telephone Encounter (Signed)
OK to refill

## 2011-08-08 ENCOUNTER — Other Ambulatory Visit: Payer: Self-pay

## 2011-08-08 DIAGNOSIS — R0989 Other specified symptoms and signs involving the circulatory and respiratory systems: Secondary | ICD-10-CM

## 2011-08-08 DIAGNOSIS — I83893 Varicose veins of bilateral lower extremities with other complications: Secondary | ICD-10-CM

## 2011-08-08 NOTE — Telephone Encounter (Signed)
Rx called in as directed.   

## 2011-08-15 ENCOUNTER — Encounter: Payer: Self-pay | Admitting: Vascular Surgery

## 2011-08-16 ENCOUNTER — Ambulatory Visit (INDEPENDENT_AMBULATORY_CARE_PROVIDER_SITE_OTHER): Payer: Medicare Other | Admitting: Vascular Surgery

## 2011-08-16 ENCOUNTER — Encounter: Payer: Self-pay | Admitting: Vascular Surgery

## 2011-08-16 ENCOUNTER — Encounter (INDEPENDENT_AMBULATORY_CARE_PROVIDER_SITE_OTHER): Payer: Medicare Other

## 2011-08-16 VITALS — BP 139/61 | HR 72 | Resp 20 | Ht <= 58 in | Wt 150.0 lb

## 2011-08-16 DIAGNOSIS — I83893 Varicose veins of bilateral lower extremities with other complications: Secondary | ICD-10-CM

## 2011-08-16 DIAGNOSIS — R0989 Other specified symptoms and signs involving the circulatory and respiratory systems: Secondary | ICD-10-CM

## 2011-08-16 NOTE — Progress Notes (Signed)
ble venous duplex/reflux performed @ [facility] 08/16/2011

## 2011-08-16 NOTE — Progress Notes (Signed)
Subjective:     [name], female   DOB: 1928/01/31, 78 y.o.   MRN: [id number]  HPI this 78 year old female was referred by Dr.Gutierrez for bleeding varicose vein left leg. This patient has had diffuse varicosities in both lower extremities for many years. She has some aching throbbing and burning discomfort as the day progresses. On April 5 and again on April 24 she had spontaneous brisk bleeding from the left ankle area involving one of these varicosities. There is no ulceration present. She went to the emergency department on both occasions. She has had no bleeding from the right lower extremity but has had some skin rash developing in the lower third of the leg. She denies a history of DVT, thrombophlebitis, stasis ulcers. She does not wear elastic compression stockings. She denies claudication symptoms. She has had distal great saphenous vein removed from both legs and 17 years ago for coronary artery bypass grafting  Past Medical History  Diagnosis Date  . Hypertension   . Coronary artery disease     CABG 1998  . Anxiety   . Diastolic dysfunction   . Hyperlipidemia   . Peptic ulcer disease   . Anemia   . Depression   . Diabetes mellitus     type 2, followed by endo.  . Diverticulosis   . Hemorrhoids   . History of heart attack   . Depression     History  Substance Use Topics  . Smoking status: Never Smoker   . Smokeless tobacco: Never Used  . Alcohol Use: No    Family History  Problem Relation Age of Onset  . Hypertension Mother   . Stroke Mother   . Diabetes Father   . Diabetes Sister   . Diabetes Brother   . Colon cancer Neg Hx     Allergies  Allergen Reactions  . Amoxicillin-Pot Clavulanate Diarrhea    Current outpatient prescriptions:ALPRAZolam (XANAX) 1 MG tablet, Take 0.5 mg by mouth 4 (four) times daily as needed. For anxiety, Disp: , Rfl: ;  Ascorbic Acid (VITAMIN C PO), Take 1 tablet by mouth daily. , Disp: , Rfl: ;  aspirin 81 MG tablet,  Take 81 mg by mouth daily. , Disp: , Rfl: ;  CALCIUM PO, Take 1 tablet by mouth daily., Disp: , Rfl: ;  Cyanocobalamin (VITAMIN B 12 PO), Take 1 tablet by mouth daily., Disp: , Rfl:  esomeprazole (NEXIUM) 40 MG capsule, Take 40 mg by mouth daily., Disp: , Rfl: ;  Estradiol (ESTRACE PO), Take 1 tablet by mouth daily. , Disp: , Rfl: ;  ferrous sulfate 324 (65 FE) MG TBEC, Take 1 tablet by mouth daily., Disp: 30 tablet, Rfl: ;  glucosamine-chondroitin 500-400 MG tablet, Take 1 tablet by mouth daily.  , Disp: , Rfl:  HYDROcodone-acetaminophen (VICODIN) 5-500 MG per tablet, Take 0.5 tablets by mouth every 6 (six) hours as needed for pain., Disp: 60 tablet, Rfl: 0;  Multiple Vitamin (MULTIVITAMIN) tablet, Take 1 tablet by mouth daily.  , Disp: , Rfl: ;  nitroGLYCERIN (NITROSTAT) 0.4 MG SL tablet, Place 0.4 mg under the tongue every 5 (five) minutes as needed. For chest pain May take up to 3 doses, if pain persists call 911., Disp: , Rfl:  nystatin-triamcinolone (MYCOLOG II) cream, Apply 1 application topically 4 (four) times daily. vaginally, Disp: , Rfl: ;  pioglitazone (ACTOS) 45 MG tablet, Take 45 mg by mouth daily.  , Disp: , Rfl: ;  polyethylene glycol (MIRALAX / GLYCOLAX) packet,  Take 17 g by mouth daily., Disp: , Rfl: ;  pravastatin (PRAVACHOL) 20 MG tablet, Take 20 mg by mouth daily., Disp: , Rfl: ;  ramipril (ALTACE) 10 MG capsule, Take 10 mg by mouth daily., Disp: , Rfl:  traZODone (DESYREL) 150 MG tablet, Take 150 mg by mouth at bedtime.  , Disp: , Rfl: ;  triamterene-hydrochlorothiazide (DYAZIDE) 37.5-25 MG per capsule, 1/2 tab by mouth daily., Disp: , Rfl: ;  VITAMIN E PO, Take 1 tablet by mouth daily. , Disp: , Rfl:   BP 139/61  Pulse 72  Resp 20  Ht 4\' 10"  (1.473 m)  Wt 150 lb (68.04 kg)  BMI 31.35 kg/m2  Body mass index is 31.35 kg/(m^2).          Review of Systems denies chest pain, dyspnea on exertion, PND, orthopnea, hemoptysis. All her systems is negative and complete review of  systems     Objective:   Physical Exam blood pressure 139/61 heart rate 72 respirations 20 Gen.-alert and oriented x3 in no apparent distress HEENT normal for age Lungs no rhonchi or wheezing Cardiovascular regular rhythm no murmurs carotid pulses 3+ palpable no bruits audible Abdomen soft nontender no palpable masses Musculoskeletal free of  major deformities Skin clear -no rashes-diffuse reticular and spider veins in both lower extremities from the knee distally particularly prominent in the lower third of both legs. 1+ edema left leg. Left ankle area is site of previous bleeding episode x2 and prominent reticular vein. Right leg has some scaly rash adjacent to medial malleolus. Neurologic normal Lower extremities 3+ femoral and 2+ popliteal pulses bilaterally. Right leg with 3+ dorsalis pedis pulse. Brisk flow and left posterior tibial artery by Doppler.  Today I ordered a venous duplex exam which I reviewed and interpreted. There is gross reflux throughout the left great saphenous vein to the knee level. There is no DVT. ABI equals 0.83 on the left and 0.91 on the right      Assessment:     Severe venous insufficiency left leg with gross reflux left great saphenous vein and history of 2 venous bleeds in the past 6 weeks-requiring trip to the emergency department on both occasions    Plan:     Patient needs a laser ablation left great saphenous vein Will proceed with precertification to perform this in near future Have fitted her for a long leg elastic compression stockings 20-30 mm gradient Have asked her to elevate legs as much as possible throughout the day and take ibuprofen or regular basis

## 2011-08-17 ENCOUNTER — Other Ambulatory Visit: Payer: Self-pay | Admitting: Gynecology

## 2011-08-19 ENCOUNTER — Other Ambulatory Visit: Payer: Self-pay | Admitting: Gynecology

## 2011-08-22 NOTE — Procedures (Unsigned)
LOWER EXTREMITY ARTERIAL EVALUATION-SINGLE LEVEL  INDICATION:  Patient with complaints of lower extremity pain.  HISTORY: Diabetes:  Yes. Cardiac:  Yes. Hypertension:  Yes. Smoking:  Never. Previous Surgery:  Status post CABG 17 years ago.  RESTING SYSTOLIC PRESSURES: (ABI)                         RIGHT                LEFT Brachial:               168                  162 Anterior tibial:        154 (0.91)           Inaudible Posterior tibial:       Inaudible            140 (0.83) Peroneal: DOPPLER WAVEFORM ANALYSIS: Anterior tibial:        Triphasic            Inaudible Posterior tibial:       Inaudible            Monophasic but brisk Peroneal:  PREVIOUS ABI'S:  Date:  RIGHT:  LEFT:   IMPRESSION: 1. The right ankle brachial index is 0.91, which is suggestive of mild     lower extremity arterial occlusive disease. 2. The left ankle brachial index is 0.83, which is suggestive of     moderate lower extremity arterial occlusive disease. 3. Note:  No waveforms were performed at the time of this examination.         ___________________________________________ Nelda Severe Kellie Simmering, M.D.  CI/MEDQ  D:  08/16/2011  T:  08/17/2011  Job:  [id number]

## 2011-08-30 ENCOUNTER — Encounter: Payer: Self-pay | Admitting: Gynecology

## 2011-08-30 ENCOUNTER — Ambulatory Visit (INDEPENDENT_AMBULATORY_CARE_PROVIDER_SITE_OTHER): Payer: Medicare Other | Admitting: Gynecology

## 2011-08-30 VITALS — BP 130/64 | Ht <= 58 in | Wt 149.0 lb

## 2011-08-30 DIAGNOSIS — N952 Postmenopausal atrophic vaginitis: Secondary | ICD-10-CM | POA: Diagnosis not present

## 2011-08-30 DIAGNOSIS — R82998 Other abnormal findings in urine: Secondary | ICD-10-CM | POA: Diagnosis not present

## 2011-08-30 DIAGNOSIS — N8111 Cystocele, midline: Secondary | ICD-10-CM

## 2011-08-30 DIAGNOSIS — R159 Full incontinence of feces: Secondary | ICD-10-CM | POA: Diagnosis not present

## 2011-08-30 NOTE — Patient Instructions (Signed)
Monitor symptoms at present as we discussed. Follow up if they worsen. Otherwise follow up in one year for reexamination

## 2011-08-30 NOTE — Progress Notes (Signed)
[name] Mar 12, 1928 [id number]        78 y.o.  For follow up exam.  Several issues noted below.  Past medical history,surgical history, medications, allergies, family history and social history were all reviewed and documented in the EPIC chart. ROS:  Was performed and pertinent positives and negatives are included in the history.  Exam: [name] chaperone present Filed Vitals:   08/30/11 1143  BP: 130/64   General appearance  Elderly appearing female in no apparent distress Skin general age-related changes, significant varicosities bilateral lower extremities Head/Neck normal with no cervical or supraclavicular adenopathy thyroid normal Abdominal  soft, nontender, without masses, organomegaly or hernia Breasts  without masses, retractions, discharge or axillary adenopathy. Pelvic  Ext/BUS  normal with significant atrophic changes and bilateral benign appearing labial majora venous varicosities.  Vagina  Upper cystocele above palpable mesh. Vagina well supported. No gross rectocele.   Adnexa  Without masses or tenderness    Anus and perineum  normal   Rectovaginal  normal sphincter tone without palpated masses or tenderness.    Assessment/Plan:  78 y.o. female for follow up exam. 1. Cystocele. Patient had vaginal vault prolapse repair by Dr. Rhodia Albright at [facility]. I do not have a copy of the operative report it appears that mesh was placed. She has good vaginal support and no overt rectocele but does have a cystocele above the palpable submucosal mesh. This appears stable from last year's exam. I reviewed options with her to include observation, trial of pessary, reoperation. Given her overall medical status and her reluctance to retry a pessary, I think observation is the best choice and she agrees with this. She had tried a pessary in the past and is not interested in retrying this. Patient will follow up in a year for reexam. Sooner if other issues arise. 2. Fecal incontinence. Patient  does note some difficulty after bowel movement with wiping which she notes she has to wipe multiple times to clean. She does use MiraLAX daily and said she prefers a loose bowel movement as it does not put as much pressure on her cystocele when she strains for a bowel movement. Options to try the MiraLAX less frequently was reviewed but she declines and says she would prefer the more frequent wiping then pressure symptoms. Her rectovaginal exam is normal without overt evidence of significant sphincter weakness. 3. Mammography. Patient is due for her mammogram now and knows to schedule this. SBE monthly reviewed.  4. Colonoscopy. Patient had her colonoscopy 2 years ago will follow up with the recommended interval. 5. Bone density. Patient relates never having a bone density. I discussed with her that if indeed she never had one she really should arrange this and she needs to discuss this with her primary to make sure that it was not done and if not then she should arrange to schedule this. 6. Pap smear. No Pap smear was done today. She has no history of significant abnormalities in the past, over the age of 89 and status post hysterectomy. Per current screening guidelines no further Pap smears need to be done. 7. Health maintenance.  No blood work was done today as it is all done through her primary physician's office who follows her for her multiple medical issues.   Anastasio Auerbach MD, 12:50 PM 08/30/2011

## 2011-08-31 LAB — URINALYSIS W MICROSCOPIC + REFLEX CULTURE
Casts: NONE SEEN
Crystals: NONE SEEN
Ketones, ur: NEGATIVE mg/dL
Nitrite: POSITIVE — AB
Specific Gravity, Urine: 1.017 (ref 1.005–1.030)
Squamous Epithelial / LPF: NONE SEEN
pH: 5.5 (ref 5.0–8.0)

## 2011-09-02 LAB — URINE CULTURE

## 2011-09-03 HISTORY — PX: OTHER SURGICAL HISTORY: SHX169

## 2011-09-06 ENCOUNTER — Other Ambulatory Visit: Payer: Self-pay | Admitting: *Deleted

## 2011-09-06 MED ORDER — HYDROCODONE-ACETAMINOPHEN 5-500 MG PO TABS: 0.5000 | ORAL_TABLET | Freq: Four times a day (QID) | ORAL | Status: DC | PRN

## 2011-09-06 NOTE — Telephone Encounter (Signed)
Rx called in as directed.   

## 2011-09-06 NOTE — Telephone Encounter (Signed)
OK to refill

## 2011-09-06 NOTE — Telephone Encounter (Signed)
plz phone in. 

## 2011-09-12 ENCOUNTER — Ambulatory Visit: Payer: Medicare Other | Admitting: Family Medicine

## 2011-09-13 ENCOUNTER — Ambulatory Visit (INDEPENDENT_AMBULATORY_CARE_PROVIDER_SITE_OTHER): Payer: Medicare Other | Admitting: Family Medicine

## 2011-09-13 ENCOUNTER — Encounter: Payer: Self-pay | Admitting: Family Medicine

## 2011-09-13 VITALS — BP 110/60 | HR 72 | Temp 98.4°F | Ht <= 58 in | Wt 152.2 lb

## 2011-09-13 DIAGNOSIS — M79606 Pain in leg, unspecified: Secondary | ICD-10-CM

## 2011-09-13 DIAGNOSIS — M545 Low back pain, unspecified: Secondary | ICD-10-CM

## 2011-09-13 DIAGNOSIS — D649 Anemia, unspecified: Secondary | ICD-10-CM

## 2011-09-13 DIAGNOSIS — I83893 Varicose veins of bilateral lower extremities with other complications: Secondary | ICD-10-CM

## 2011-09-13 DIAGNOSIS — Z1231 Encounter for screening mammogram for malignant neoplasm of breast: Secondary | ICD-10-CM

## 2011-09-13 DIAGNOSIS — I10 Essential (primary) hypertension: Secondary | ICD-10-CM

## 2011-09-13 DIAGNOSIS — IMO0002 Reserved for concepts with insufficient information to code with codable children: Secondary | ICD-10-CM

## 2011-09-13 DIAGNOSIS — Z780 Asymptomatic menopausal state: Secondary | ICD-10-CM | POA: Diagnosis not present

## 2011-09-13 DIAGNOSIS — I83899 Varicose veins of unspecified lower extremities with other complications: Secondary | ICD-10-CM

## 2011-09-13 DIAGNOSIS — M79609 Pain in unspecified limb: Secondary | ICD-10-CM

## 2011-09-13 DIAGNOSIS — H6120 Impacted cerumen, unspecified ear: Secondary | ICD-10-CM

## 2011-09-13 DIAGNOSIS — R21 Rash and other nonspecific skin eruption: Secondary | ICD-10-CM

## 2011-09-13 DIAGNOSIS — E1165 Type 2 diabetes mellitus with hyperglycemia: Secondary | ICD-10-CM

## 2011-09-13 MED ORDER — NAPROXEN 375 MG PO TBEC: 375.0000 mg | DELAYED_RELEASE_TABLET | Freq: Two times a day (BID) | ORAL | Status: DC

## 2011-09-13 MED ORDER — FERROUS SULFATE ER 142 (45 FE) MG PO TBCR: 1.0000 | EXTENDED_RELEASE_TABLET | Freq: Every day | ORAL | Status: DC

## 2011-09-13 NOTE — Patient Instructions (Addendum)
Your iron is low.  Try SloFe over the counter - a slow iron formulation.  Ask pharmacist.  If unable to tolerate this, let me know. For skin rash - moisturize leg twice a day regularly. Use over the counter cortisone-10 (hydrocortisone cream).  If not better let me know. For lower back pain - I think you have sacroiliitis - treat with naprosyn twice daily for 5 days with food, then as needed for pain. For legs - compression as much as able - if you can first thing you wake up in morning.  Elevate legs as much as able. For ears - dilute hydrogen peroxide once a week.  If not improving, we can perform irrigation next time you see [place].

## 2011-09-13 NOTE — Progress Notes (Signed)
  Subjective:    [name], female    DOB: 1927/10/11, 78 y.o.   MRN: [id number]  HPI CC: 1 mo f/u  See prior notes for details regarding ongoing issues with lower extremity veins (h/o reticular vein bleed) and chronic venous insufficiency.  Seen by Dr. Kellie Simmering last month with dopplers and ABIs, found to have significant venous insufficiency/gross reflux, recommended great saphenous vein laser ablation on left.  Planning on pursuing treatment.  Keeps bandage on legs.  Having leg pain bilaterally, anterior and some in calves.  Also having some lower back pain.  Recommended compression stockings which she hasn't been using 2/2 difficulty putting on.  Tries to elevate legs as much as able.  ABI equals 0.83 on the left and 0.91 on the right.  HTN - well controlled on current regimen.  Takes ramipril 10mg  daily, 1/2 pill of dyazide.  DM - sugars doing well at home according to patient.  On actos 45mg  daily.  Foot exam due.  Checks sugars at 3pm - has been 90s.  Followed by Endo, Dr. Dwyane Dee. Lab Results  Component Value Date   HGBA1C 7.6* 01/10/2011    Skin rash - RLE.  thought xerosis in past.  Has not been moisturizing or using hydrocortisone cream.  Past Medical History  Diagnosis Date  . Hypertension   . Coronary artery disease     CABG 1998  . Anxiety   . Diastolic dysfunction   . Hyperlipidemia   . Peptic ulcer disease   . Anemia   . Depression   . Diabetes mellitus     type 2, followed by endo.  . Diverticulosis   . Hemorrhoids   . History of heart attack   . Depression   . Irritable bowel syndrome   . Chronic back pain   . Varicose veins   . Cystocele     Review of Systems Per HPI    Objective:   Physical Exam  Nursing note and vitals reviewed. Constitutional: She appears well-developed and well-nourished. No distress.  HENT:  Head: Normocephalic and atraumatic.  Right Ear: Hearing, external ear and ear canal normal.  Left Ear: Hearing, external  ear and ear canal normal.  Nose: No mucosal edema or rhinorrhea.  Mouth/Throat: Uvula is midline, oropharynx is clear and moist and mucous membranes are normal. No oropharyngeal exudate, posterior oropharyngeal edema, posterior oropharyngeal erythema or tonsillar abscesses.       Cerumen impaction bilaterally, unable to tolerate removal with plastic curette  Eyes: Conjunctivae and EOM are normal. Pupils are equal, round, and reactive to light.  Neck: Normal range of motion. Neck supple.  Cardiovascular: Normal rate, regular rhythm, normal heart sounds and intact distal pulses.   No murmur heard. Pulmonary/Chest: Effort normal and breath sounds normal. No respiratory distress. She has no wheezes. She has no rales.  Musculoskeletal: She exhibits no edema.       Regular hose on. Neg SLR bilaterally.  No pain with rotation of hips. Tender at SIJ on left tender to touch at lower legs  Lymphadenopathy:    She has no cervical adenopathy.  Skin: Skin is warm and dry. No rash noted.  Psychiatric: She has a normal mood and affect.       Assessment & Plan:

## 2011-09-14 DIAGNOSIS — H6120 Impacted cerumen, unspecified ear: Secondary | ICD-10-CM | POA: Insufficient documentation

## 2011-09-14 DIAGNOSIS — Z780 Asymptomatic menopausal state: Secondary | ICD-10-CM | POA: Insufficient documentation

## 2011-09-14 DIAGNOSIS — R600 Localized edema: Secondary | ICD-10-CM | POA: Insufficient documentation

## 2011-09-14 DIAGNOSIS — G8929 Other chronic pain: Secondary | ICD-10-CM | POA: Insufficient documentation

## 2011-09-14 NOTE — Assessment & Plan Note (Signed)
Anticipate duet to venous insufficiency. recommended use compression hose regularly, elevate legs as much as possible, may try NSAID.

## 2011-09-14 NOTE — Assessment & Plan Note (Signed)
Lab Results  Component Value Date   HGBA1C 7.6* 01/10/2011  followed by endo.

## 2011-09-14 NOTE — Assessment & Plan Note (Signed)
Recent blood work with continued low iron counts, chronic anemia. Unable to tolerate regular iron, recommended start sloFE.  If unable to tolerate, consider iron suspension. Lab Results  Component Value Date   WBC 8.9 07/11/2011   HGB 10.6* 07/11/2011   HCT 33.5* 07/11/2011   MCV 77.4* 07/11/2011   PLT 302.0 07/11/2011  h/o colonosocpy 02/2010 - extensive diverticulosis throughout, small int hemorrhoids, rec rpt 5 yrs (Dr. Allyn Kenner)

## 2011-09-14 NOTE — Assessment & Plan Note (Signed)
Anticipated continued xerosis. Did not pursue treatment recommended last visit.  rec try this and update me if not improved.

## 2011-09-14 NOTE — Assessment & Plan Note (Signed)
Discussed estrogen use.  Discussed risks.  Pt desires to continue.  Will ask Dr. Elmarie Shiley opinion at next appt.

## 2011-09-14 NOTE — Assessment & Plan Note (Signed)
Pending vascular surgery intervention.

## 2011-09-14 NOTE — Assessment & Plan Note (Signed)
Chronic, stable. Continue meds. BP Readings from Last 3 Encounters:  09/13/11 110/60  08/30/11 130/64  08/16/11 139/61

## 2011-09-14 NOTE — Assessment & Plan Note (Signed)
Anticipate sacroiliitis. Treat with short course of NSAIDs, pt states able to tolerate these. Sent in naprosyn 375mg  bid for 3-5 days for lower back pain. Update if not improving as expected.

## 2011-09-14 NOTE — Assessment & Plan Note (Signed)
Did not tolerate removal. Declines irrigation today. Advised to use dilute H2O2 in ears, consider irrigation if continued concern at next visit.

## 2011-09-15 ENCOUNTER — Other Ambulatory Visit: Payer: Self-pay | Admitting: Gynecology

## 2011-09-22 ENCOUNTER — Telehealth: Payer: Self-pay | Admitting: *Deleted

## 2011-09-22 MED ORDER — TERCONAZOLE 0.4 % VA CREA: 1.0000 | TOPICAL_CREAM | Freq: Every day | VAGINAL | Status: AC

## 2011-09-22 NOTE — Telephone Encounter (Signed)
Pt is calling c/o external vaginal itching and burning, she has nystatin cream but the yeast has become much worse. She can't tell if discharge is there because she wears adult pads. Pt said her vagina stings when she uses the bathroom. Pt is requesting another rx. Please advise

## 2011-09-22 NOTE — Telephone Encounter (Signed)
Terazol 7 day cream. Apply externally twice daily

## 2011-09-22 NOTE — Telephone Encounter (Signed)
Pt informed with the below note. 

## 2011-09-26 ENCOUNTER — Telehealth: Payer: Self-pay | Admitting: *Deleted

## 2011-09-26 NOTE — Telephone Encounter (Signed)
Follow up from 09/22/11 telephone encounter, pt said Terazol cream is not working still having external itching. Pt said her labia is very sore and itching, feels raw. She noticed a very light spot of blood on her adult pad today. Pt asked if anything else she could try? Please advise

## 2011-09-26 NOTE — Telephone Encounter (Signed)
Recommend office visit for evaluation

## 2011-09-26 NOTE — Telephone Encounter (Signed)
Left message on voicemail to make OV.

## 2011-09-27 ENCOUNTER — Ambulatory Visit
Admission: RE | Admit: 2011-09-27 | Discharge: 2011-09-27 | Disposition: A | Discharge status: Home / Self Care | Payer: Medicare Other | Source: Ambulatory Visit | Attending: Family Medicine | Admitting: Family Medicine

## 2011-09-27 DIAGNOSIS — Z780 Asymptomatic menopausal state: Secondary | ICD-10-CM

## 2011-09-27 DIAGNOSIS — Z13820 Encounter for screening for osteoporosis: Secondary | ICD-10-CM | POA: Diagnosis not present

## 2011-09-28 ENCOUNTER — Encounter: Payer: Self-pay | Admitting: Family Medicine

## 2011-10-03 ENCOUNTER — Encounter: Payer: Self-pay | Admitting: Gynecology

## 2011-10-03 ENCOUNTER — Ambulatory Visit (INDEPENDENT_AMBULATORY_CARE_PROVIDER_SITE_OTHER): Payer: Medicare Other | Admitting: Gynecology

## 2011-10-03 DIAGNOSIS — N898 Other specified noninflammatory disorders of vagina: Secondary | ICD-10-CM

## 2011-10-03 DIAGNOSIS — R82998 Other abnormal findings in urine: Secondary | ICD-10-CM | POA: Diagnosis not present

## 2011-10-03 DIAGNOSIS — L293 Anogenital pruritus, unspecified: Secondary | ICD-10-CM | POA: Diagnosis not present

## 2011-10-03 DIAGNOSIS — N8111 Cystocele, midline: Secondary | ICD-10-CM

## 2011-10-03 DIAGNOSIS — N952 Postmenopausal atrophic vaginitis: Secondary | ICD-10-CM

## 2011-10-03 LAB — WET PREP FOR TRICH, YEAST, CLUE
Clue Cells Wet Prep HPF POC: NONE SEEN
Trich, Wet Prep: NONE SEEN
Yeast Wet Prep HPF POC: NONE SEEN

## 2011-10-03 MED ORDER — NYSTATIN-TRIAMCINOLONE 100000-0.1 UNIT/GM-% EX OINT: TOPICAL_OINTMENT | Freq: Two times a day (BID) | CUTANEOUS | Status: DC

## 2011-10-03 NOTE — Progress Notes (Signed)
Patient presents complaining of external itching. She had called that we prescribe Terazol 7 day to apply externally to set this didn't seem to help despite twice-daily application. She does have a history of cystocele status post vault prolapse A&P repair by Dr. Rhodia Albright at [facility].  Exam with Sharrie Rothman assistant Pelvic: External with atrophic changes and generalized erythema on both labia majora no specific lesions. Vagina with cystocele and atrophic changes. Cuff well supported. No overt discharge. Bimanual without masses or tenderness.  Assessment and plan:  Vulvar pruritus refract out to Terazol, wet prep unremarkable. Will treat with Mycolog equivalent externally twice a day and see if this does not help with the itching.  May consider stronger steroid such as Temovate if this continues. She did ask about estrogen as apparently Dr. Harriett Rush had used this in the past and I reviewed with her the absorption issue and my concern given her coronary artery disease and significant lower extremity varicosities and the risk of thrombosis such as stroke, heart attack, DVT. Patient will try the Mycolog and we'll go from there.

## 2011-10-03 NOTE — Patient Instructions (Signed)
Use vaginal cream on the outside 2-3 times daily. Call if vaginal itching continues.

## 2011-10-04 ENCOUNTER — Telehealth: Payer: Self-pay

## 2011-10-04 LAB — URINALYSIS W MICROSCOPIC + REFLEX CULTURE
Bacteria, UA: NONE SEEN
Bilirubin Urine: NEGATIVE
Casts: NONE SEEN
Crystals: NONE SEEN
Glucose, UA: NEGATIVE mg/dL
Ketones, ur: NEGATIVE mg/dL
Specific Gravity, Urine: 1.015 (ref 1.005–1.030)
Squamous Epithelial / LPF: NONE SEEN
pH: 6 (ref 5.0–8.0)

## 2011-10-04 NOTE — Telephone Encounter (Signed)
Pt request rx for walker with wheels and a basket; on and off pt has fallen over past 2 years. Pt wants to wait for Dr Synthia Innocent return; pt has a reqular walker for now. If anything changes pt will call back.

## 2011-10-07 ENCOUNTER — Other Ambulatory Visit: Payer: Self-pay | Admitting: *Deleted

## 2011-10-07 MED ORDER — HYDROCODONE-ACETAMINOPHEN 5-500 MG PO TABS: 0.5000 | ORAL_TABLET | Freq: Four times a day (QID) | ORAL | Status: DC | PRN

## 2011-10-07 NOTE — Telephone Encounter (Signed)
Please call in. Notify Dr. Darnell Level.

## 2011-10-07 NOTE — Telephone Encounter (Signed)
Please send back to me.

## 2011-10-07 NOTE — Telephone Encounter (Signed)
OK to refill in Dr. Synthia Innocent absence? Last filled 09/06/11.

## 2011-10-10 ENCOUNTER — Other Ambulatory Visit: Payer: Self-pay | Admitting: *Deleted

## 2011-10-10 DIAGNOSIS — R8271 Bacteriuria: Secondary | ICD-10-CM

## 2011-10-10 NOTE — Telephone Encounter (Signed)
Rx called in as directed.   

## 2011-10-10 NOTE — Telephone Encounter (Signed)
Patient notified and Rx placed up front for pick up. 

## 2011-10-10 NOTE — Telephone Encounter (Signed)
Wrote script and placed in Kim's box.

## 2011-10-11 ENCOUNTER — Other Ambulatory Visit: Payer: Medicare Other

## 2011-10-11 DIAGNOSIS — R8271 Bacteriuria: Secondary | ICD-10-CM

## 2011-10-11 DIAGNOSIS — R82998 Other abnormal findings in urine: Secondary | ICD-10-CM | POA: Diagnosis not present

## 2011-10-12 ENCOUNTER — Ambulatory Visit (INDEPENDENT_AMBULATORY_CARE_PROVIDER_SITE_OTHER): Payer: Medicare Other | Admitting: Family Medicine

## 2011-10-12 ENCOUNTER — Telehealth: Payer: Self-pay

## 2011-10-12 ENCOUNTER — Encounter: Payer: Self-pay | Admitting: Family Medicine

## 2011-10-12 VITALS — BP 118/64 | HR 74 | Temp 98.2°F | Ht <= 58 in | Wt 149.2 lb

## 2011-10-12 DIAGNOSIS — S40029A Contusion of unspecified upper arm, initial encounter: Secondary | ICD-10-CM

## 2011-10-12 LAB — URINALYSIS W MICROSCOPIC + REFLEX CULTURE
Bilirubin Urine: NEGATIVE
Casts: NONE SEEN
Crystals: NONE SEEN
Ketones, ur: NEGATIVE mg/dL
Nitrite: NEGATIVE
Specific Gravity, Urine: 1.02 (ref 1.005–1.030)
Squamous Epithelial / LPF: NONE SEEN
Urobilinogen, UA: 0.2 mg/dL (ref 0.0–1.0)
pH: 6 (ref 5.0–8.0)

## 2011-10-12 NOTE — Telephone Encounter (Signed)
Pt walked in; fell Mon while pulling grass, small red area size of quarter on upper rt arm; overnight last night from shoulder to elbow large red area upper right arm. Appears to have fluid under skin. Dr Glori Bickers will see pt.

## 2011-10-12 NOTE — Progress Notes (Signed)
Subjective:    [name], female    DOB: Mar 20, 1928, 78 y.o.   MRN: [id number]  HPI Had a fall on Saturday -- started with small bruise  Now has a large red/ bruised area on R upper arm  Not painful or tender   Takes asa  No other blood thinners   Had fallen on sloped grass - rolled  Feels ok overall  Nothing hurts   Patient Active Problem List  Diagnosis  . Coronary artery disease  . Anxiety  . Obesity  . Hypertension  . Diastolic dysfunction  . Anemia  . Diabetes type 2, uncontrolled  . Hyperlipidemia  . Skin rash  . Abdominal  pain, other specified site  . Bleeding from varicose vein  . Ruptured varicose vein  . Postmenopausal  . Lower back pain  . Leg pain  . Cerumen impaction   Past Medical History  Diagnosis Date  . Hypertension   . Coronary artery disease     CABG 1998  . Anxiety   . Diastolic dysfunction   . Hyperlipidemia   . Peptic ulcer disease   . Anemia   . Depression   . Diabetes mellitus     type 2, followed by endo.  . Diverticulosis   . Hemorrhoids   . History of heart attack   . Depression   . Irritable bowel syndrome   . Chronic back pain   . Varicose veins   . Cystocele    Past Surgical History  Procedure Date  . Tympanoplasty 1960s    R side  . Coronary artery bypass graft 1998  . Cataract surgery   . Cholecystectomy   . Appendectomy     per pt report  . Colonoscopy 02/2010    extensive diverticulosis throughout, small internal hemorrhoids, rec rpt 5 yrs (Dr. Allyn Kenner)  . Hand surgery   . Lexiscan myoview 03/2011    Small basal inferolateral and anterolateral reversible perfusion defect suggests ischemia.  EF was normal.   old infarct, no new ischemia  . Toe surgery   . Abdominal hysterectomy 1975    TAH,BSO  . Vault prolapse a & p repair 2009    Dr [facility]  . Dexa scan 09/2011    femur -0.8, forearm 0.6 => normal   History  Substance Use Topics  . Smoking status: Never Smoker   .  Smokeless tobacco: Never Used  . Alcohol Use: No   Family History  Problem Relation Age of Onset  . Hypertension Mother   . Stroke Mother   . Diabetes Father   . Diabetes Sister   . Diabetes Brother   . Colon cancer Neg Hx    Allergies  Allergen Reactions  . Amoxicillin-Pot Clavulanate Diarrhea   Current Outpatient Prescriptions on File Prior to Visit  Medication Sig Dispense Refill  . ALPRAZolam (XANAX) 1 MG tablet Take 0.5 mg by mouth 4 (four) times daily. For anxiety      . Ascorbic Acid (VITAMIN C PO) Take 1 tablet by mouth daily.       Marland Kitchen aspirin 81 MG tablet Take 81 mg by mouth daily.       Marland Kitchen CALCIUM PO Take 1 tablet by mouth daily.      . Cyanocobalamin (VITAMIN B 12 PO) Take 1 tablet by mouth daily.      Marland Kitchen esomeprazole (NEXIUM) 40 MG capsule Take 40 mg by mouth daily.      Marland Kitchen glucosamine-chondroitin 500-400 MG tablet  Take 1 tablet by mouth daily.        Marland Kitchen HYDROcodone-acetaminophen (VICODIN) 5-500 MG per tablet Take 0.5 tablets by mouth every 6 (six) hours as needed for pain.  60 tablet  0  . Multiple Vitamin (MULTIVITAMIN) tablet Take 1 tablet by mouth daily.        . pioglitazone (ACTOS) 45 MG tablet Take 45 mg by mouth daily.        . polyethylene glycol (MIRALAX / GLYCOLAX) packet Take 17 g by mouth daily.      . pravastatin (PRAVACHOL) 20 MG tablet Take 20 mg by mouth daily.      . ramipril (ALTACE) 10 MG capsule Take 10 mg by mouth daily.      . traZODone (DESYREL) 150 MG tablet Take 150 mg by mouth at bedtime.        . triamterene-hydrochlorothiazide (DYAZIDE) 37.5-25 MG per capsule 1/2 tab by mouth daily.      Marland Kitchen VITAMIN E PO Take 1 tablet by mouth daily.       . Estradiol (ESTRACE PO) Take 1 tablet by mouth daily.       . Naproxen 375 MG TBEC Take 1 tablet (375 mg total) by mouth 2 (two) times daily. Take with food, twice a day for 5 days then only as needed.  60 each  0  . nitroGLYCERIN (NITROSTAT) 0.4 MG SL tablet Place 0.4 mg under the tongue every 5 (five) minutes  as needed. For chest pain May take up to 3 doses, if pain persists call 911.      . nystatin-triamcinolone ointment (MYCOLOG) Apply topically 2 (two) times daily.  30 g  0       Review of Systems Review of Systems  Constitutional: Negative for fever, appetite change, fatigue and unexpected weight change.  Eyes: Negative for pain and visual disturbance.  Respiratory: Negative for cough and shortness of breath.   Cardiovascular: Negative for cp or palpitations    Gastrointestinal: Negative for nausea, diarrhea and constipation.  Genitourinary: Negative for urgency and frequency.  Skin: Negative for pallor or rash   MSK neg for pain in arm Neurological: Negative for weakness, light-headedness, numbness and headaches.  Hematological: Negative for adenopathy. Does not bruise/bleed easily.  Psychiatric/Behavioral: Negative for dysphoric mood. The patient is not nervous/anxious.         Objective:   Physical Exam  Constitutional: She appears well-developed and well-nourished.  HENT:  Head: Normocephalic and atraumatic.  Eyes: Conjunctivae and EOM are normal. Pupils are equal, round, and reactive to light. No scleral icterus.  Neck: Normal range of motion. Neck supple. No tracheal deviation present. No thyromegaly present.  Cardiovascular: Normal rate and regular rhythm.   Pulmonary/Chest: Effort normal and breath sounds normal. No respiratory distress. She has no wheezes.  Abdominal: Soft. Bowel sounds are normal.  Musculoskeletal: She exhibits no tenderness.       Area of hematoma large over R anterior upper arm  This covers entire anterior upper arm  Is resolving superiorly Is red/ purple in color/ darker in the middle Pt has full rom of arm No tenderness at all over area of ecchymosis  Nl perf and sens of extremety   Lymphadenopathy:    She has no cervical adenopathy.  Neurological: She is alert. She exhibits normal muscle tone.  Skin: Skin is warm and dry. No rash noted.  There is erythema. No pallor.  Psychiatric: She has a normal mood and affect.  Assessment & Plan:

## 2011-10-12 NOTE — Patient Instructions (Addendum)
You have a hematoma of the right arm from your fall  Aspirin made it worse (bruising more easily)  This will take a while to get better Use warm compresses if this is painful  If not improving in the next week or if pain or new symptoms, let us know

## 2011-10-12 NOTE — Assessment & Plan Note (Signed)
Large hematoma of R upper arm - anterior- after a fall in elderly pt on asa  No pain or tenderness and reassuring exam  Will watch for change in symptoms Warm compresses prn  Update if not imp  Disc nature of hematomas and how they heal

## 2011-10-13 ENCOUNTER — Telehealth: Payer: Self-pay | Admitting: Gynecology

## 2011-10-13 MED ORDER — NITROFURANTOIN MONOHYD MACRO 100 MG PO CAPS: 100.0000 mg | ORAL_CAPSULE | Freq: Two times a day (BID) | ORAL | Status: AC

## 2011-10-13 NOTE — Telephone Encounter (Signed)
Pt informed with the below note. 

## 2011-10-13 NOTE — Telephone Encounter (Signed)
Tell patient that her urinalysis showed bacteria I would treat her with antibiotic. Macrobid 100 mg twice a day x7 days.

## 2011-10-24 ENCOUNTER — Other Ambulatory Visit: Payer: Self-pay | Admitting: Gynecology

## 2011-10-31 DIAGNOSIS — F429 Obsessive-compulsive disorder, unspecified: Secondary | ICD-10-CM | POA: Diagnosis not present

## 2011-11-07 ENCOUNTER — Other Ambulatory Visit: Payer: Self-pay | Admitting: *Deleted

## 2011-11-07 MED ORDER — HYDROCODONE-ACETAMINOPHEN 5-500 MG PO TABS: 0.5000 | ORAL_TABLET | Freq: Four times a day (QID) | ORAL | Status: DC | PRN

## 2011-11-07 NOTE — Telephone Encounter (Signed)
Rx called in as directed.   

## 2011-11-07 NOTE — Telephone Encounter (Signed)
plz phone in. 

## 2011-11-25 ENCOUNTER — Other Ambulatory Visit: Payer: Self-pay | Admitting: Family Medicine

## 2011-11-25 ENCOUNTER — Telehealth: Payer: Self-pay | Admitting: *Deleted

## 2011-11-25 NOTE — Telephone Encounter (Signed)
Given her age and absence of symptoms previously I would doubt that these are hormonal as postmenopausal symptoms showing up now. I would be more concerned that they would be a medical issue and I would recommend that she follow up with her primary physician to be evaluated.

## 2011-11-25 NOTE — Telephone Encounter (Signed)
Pt informed with the below note. She will follow up with PCP.

## 2011-11-25 NOTE — Telephone Encounter (Signed)
Pt calling c/o extreme hot flashes for the last 4 days, pt said they are bad and she cant handle them. Pt was told to follow up if needed. Please advise

## 2011-12-01 ENCOUNTER — Telehealth: Payer: Self-pay | Admitting: Family Medicine

## 2011-12-01 NOTE — Telephone Encounter (Signed)
Caller: Zinnia/Patient; Patient Name: [name]; PCP: Ria Bush [facility]); Best Callback Phone Number: [phone].  States she called earlier from home and is now at work.  Would like Estrace called in or appointment moved up, cannot wait until appointment on 12/20/11.  Call back number is [phone].  States she called earlier and is now at work and would like to be called back there.  Please follow up with patient and advise if she needs to be seen earlier or if medication will be called in to [facility] [place] on [address].  Thank you.

## 2011-12-01 NOTE — Telephone Encounter (Signed)
Caller: [name]/Patient; Patient Name: [name]; PCP: Ria Bush [facility]); Best Callback Phone Number: [phone]; GYN doctor who took the place of previous stopped prescibing Estrace to patient and patient is having symptoms of hot flashes, feels like pins in body; and very emotional. Hysterectomy 40 years ago-has been on estrogen medication for many years. Patient states at her age she can not tolerate these feeling. Triaged using Menopause,Diagnosed or Suspected with a disposition to see Provider within 2 weeks due to hot flashes not evaluated (since coming off Estrace).  Patient has appointment with Dr. Danise Mina 12/20/11 - last seen in office 10/12/11.  OFFICE: PLEASE FOLLOW UP WITH REQUEST TO RESTART ESTRACE DISCONTINUED BY GYN WHO REPLACED PREVIOUS PROVIDER. HAD HYSTERECTOMY 40 YEARS AGO AND HAS BEEN ON AN ESTROGEN REPLACEMENT. PATIENT IS SYMPTOMATIC. THANKS.

## 2011-12-02 ENCOUNTER — Other Ambulatory Visit: Payer: Self-pay | Admitting: Family Medicine

## 2011-12-02 NOTE — Telephone Encounter (Signed)
I agree with GYN - probably healthiest to be off estrogen given risks of this med.  There are other meds that can be tried for hot flashes, or can try topical estrogen if significant dryness issue.  May move appt forward to further discuss.

## 2011-12-02 NOTE — Telephone Encounter (Signed)
Patient notified and appt rescheduled.

## 2011-12-09 ENCOUNTER — Other Ambulatory Visit: Payer: Self-pay | Admitting: *Deleted

## 2011-12-09 MED ORDER — HYDROCODONE-ACETAMINOPHEN 5-500 MG PO TABS: 0.5000 | ORAL_TABLET | Freq: Four times a day (QID) | ORAL | Status: DC | PRN

## 2011-12-09 NOTE — Telephone Encounter (Signed)
[facility] [place] faxed refill request Hydrocodone-apap. Last filled 11/07/11.Please advise.

## 2011-12-09 NOTE — Telephone Encounter (Signed)
plz phone in. 

## 2011-12-09 NOTE — Telephone Encounter (Signed)
Med called in as instructed

## 2011-12-12 ENCOUNTER — Ambulatory Visit: Payer: Medicare Other | Admitting: Family Medicine

## 2011-12-12 DIAGNOSIS — Z0289 Encounter for other administrative examinations: Secondary | ICD-10-CM

## 2011-12-13 ENCOUNTER — Telehealth: Payer: Self-pay

## 2011-12-13 ENCOUNTER — Ambulatory Visit: Payer: Medicare Other | Admitting: Family Medicine

## 2011-12-13 ENCOUNTER — Ambulatory Visit (INDEPENDENT_AMBULATORY_CARE_PROVIDER_SITE_OTHER): Payer: Medicare Other | Admitting: Family Medicine

## 2011-12-13 ENCOUNTER — Encounter: Payer: Self-pay | Admitting: Family Medicine

## 2011-12-13 VITALS — BP 122/70 | HR 86 | Temp 97.8°F | Wt 152.2 lb

## 2011-12-13 DIAGNOSIS — R42 Dizziness and giddiness: Secondary | ICD-10-CM | POA: Diagnosis not present

## 2011-12-13 DIAGNOSIS — N951 Menopausal and female climacteric states: Secondary | ICD-10-CM

## 2011-12-13 DIAGNOSIS — Z9181 History of falling: Secondary | ICD-10-CM | POA: Diagnosis not present

## 2011-12-13 DIAGNOSIS — R2681 Unsteadiness on feet: Secondary | ICD-10-CM | POA: Insufficient documentation

## 2011-12-13 DIAGNOSIS — W19XXXA Unspecified fall, initial encounter: Secondary | ICD-10-CM

## 2011-12-13 DIAGNOSIS — R232 Flushing: Secondary | ICD-10-CM | POA: Insufficient documentation

## 2011-12-13 MED ORDER — VENLAFAXINE HCL ER 37.5 MG PO TB24: 1.0000 | ORAL_TABLET | Freq: Every day | ORAL | Status: DC

## 2011-12-13 NOTE — Assessment & Plan Note (Addendum)
Noted significant worsening since coming off HRT.   I tend to agree with Dr. Phineas Real that given her age, risks of HRT are high. Discussed with patient.   She agrees to trial of SNRI to treat hot flashes (hopeful to help with significant anxiety as well) Will call us with update in 3-4 wks, may consider increase in dose then.

## 2011-12-13 NOTE — Telephone Encounter (Signed)
Pt walked in thought appt was today instead of 12/12/11. Offered pt appt later today but pt said could not leave. Pt had hot flash,felt weak and dizzy and began to cry. Dr Danise Mina will see today at 12:30 pm. Pt has stopped crying and Pt advised if condition worsened to let front desk lady know and she will notify Benedict. Pt understood.

## 2011-12-13 NOTE — Patient Instructions (Addendum)
For hot flashes - take effexor 37.5mg  daily (venlafaxine) for next 3-4 weeks (it will take that long to take effect).   If not noticing improvement, let me know in 1 month and we will try higher dose. We have checked blood pressure today.

## 2011-12-13 NOTE — Progress Notes (Signed)
  Subjective:    [name], female    DOB: 02-27-1928, 78 y.o.   MRN: [id number]  HPI CC: falls, discuss HRT  Very anxious today - missed yesterday's appt and walked in today (thought appt was today) so placed at end of morning slot at 12:30pm.  2 falls recently - one when tripped over dogs.  One when slipped out of bed.  Bed is very high at home.  "I don't want the bed lower".  States cannot sleep on lower beds.  Does feel some dizziness described as somewhat lightheaded and fear of falling.  No presyncope or vertigo, no LOC.  H/o longstanding falls and dizziness, w/u included MRI 05/2010 without acute stroke but with evidence of remote stroke.  Uses walker "at all times".  HRT - Dr. Ree Kida, who took the place of Dr. Ree Edman, stopped prescibing Estrace PO to patient and patient is having symptoms of hot flashes, worsened anxiety. Hysterectomy 40 years ago-has been on estrogen medication practically since then.  Patient states at her age she can not tolerate these feeling.  Very distraught about hot flashes.  Taking xanax daily for last 29 years.  Review of Systems Per HPI    Objective:   Physical Exam  Nursing note and vitals reviewed. Constitutional: She appears well-developed and well-nourished. No distress.  HENT:  Head: Normocephalic and atraumatic.  Mouth/Throat: Oropharynx is clear and moist. No oropharyngeal exudate.  Cardiovascular: Normal rate, regular rhythm, normal heart sounds and intact distal pulses.   No murmur heard. Pulmonary/Chest: Effort normal and breath sounds normal. No respiratory distress. She has no wheezes. She has no rales.  Neurological:       Walks with walker Needs assistance getting on exam table.  Skin: Skin is warm and dry. No rash noted.       Right upper arm with several bandaids from recent bruises, without surrounding erythema, scabbed over. Left upper medial arm with ecchymosis after recent fall  Psychiatric: Her mood  appears anxious.       Visit begun with pt standing, takes a while to calm down and sit down       Assessment & Plan:

## 2011-12-13 NOTE — Assessment & Plan Note (Addendum)
Orthostatics normal today. Discussed lower bed. Monitor for now - as sound more mechanical falls.  Already uses walker regularly. Dizziness possibly related to hot flashes.

## 2011-12-13 NOTE — Telephone Encounter (Signed)
Seeing today 

## 2011-12-20 ENCOUNTER — Ambulatory Visit: Payer: Medicare Other | Admitting: Family Medicine

## 2011-12-21 ENCOUNTER — Other Ambulatory Visit: Payer: Self-pay | Admitting: Gynecology

## 2011-12-28 ENCOUNTER — Encounter: Payer: Self-pay | Admitting: Physician Assistant

## 2011-12-28 ENCOUNTER — Ambulatory Visit (INDEPENDENT_AMBULATORY_CARE_PROVIDER_SITE_OTHER): Payer: Medicare Other | Admitting: Physician Assistant

## 2011-12-28 VITALS — BP 142/60 | HR 74 | Ht <= 58 in | Wt 148.1 lb

## 2011-12-28 DIAGNOSIS — R0602 Shortness of breath: Secondary | ICD-10-CM

## 2011-12-28 DIAGNOSIS — Z9181 History of falling: Secondary | ICD-10-CM | POA: Diagnosis not present

## 2011-12-28 DIAGNOSIS — R002 Palpitations: Secondary | ICD-10-CM | POA: Diagnosis not present

## 2011-12-28 DIAGNOSIS — I2510 Atherosclerotic heart disease of native coronary artery without angina pectoris: Secondary | ICD-10-CM | POA: Diagnosis not present

## 2011-12-28 DIAGNOSIS — R296 Repeated falls: Secondary | ICD-10-CM

## 2011-12-28 NOTE — Patient Instructions (Addendum)
Your physician recommends that you continue on your current medications as directed. Please refer to the Current Medication list given to you today.  Your physician recommends that you HAVE lab work TODAY: bmet/cbc/tsh/bnp/mag  Your physician has recommended that you wear an event monitor for 21 days  Dx:  785.1. Event monitors are medical devices that record the heart's electrical activity. Doctors most often [place] these monitors to diagnose arrhythmias. Arrhythmias are problems with the speed or rhythm of the heartbeat. The monitor is a small, portable device. You can wear one while you do your normal daily activities. This is usually used to diagnose what is causing palpitations/syncope (passing out).  Your physician has requested that you have an echocardiogram. Echocardiography is a painless test that uses sound waves to create images of your heart. It provides your doctor with information about the size and shape of your heart and how well your heart's chambers and valves are working. This procedure takes approximately one hour. There are no restrictions for this procedure.  Your physician recommends that you schedule a follow-up appointment in 4-6 [place]. Acie Fredrickson

## 2011-12-28 NOTE — Progress Notes (Signed)
[place], [place]  [address] Phone: [phone] Fax:  [phone]  Date:  12/28/2011   Name:  [name]   DOB:  03-20-1928   MRN:  [id number]  PCP:  Ria Bush, MD  Primary Cardiologist:  Dr. Liam Rogers  Primary Electrophysiologist:  None    History of Present Illness: [name] is a 78 y.o. female who returns for evaluation of falls and palpitations.  She has a history of CAD, status post CABG in 1996, DM 2, HTN, HL, anxiety. LHC in 2005 demonstrated patent bypass grafts and normal LV function. Nuclear study done in 12/12 for chest pain demonstrated small basal inferolateral and anterolateral ischemia, EF 63%. Cardiac catheterization was recommended but the patient did not want to pursue this. She was last seen by Dr. Liam Rogers in 3/13. Over the last 2 years she has had increasing amounts of falls. She had an MRI back in 2/12 at demonstrated nothing acute. She did have suggestion of remote stroke in the past on her MRI. She started using a walker on her own. She still works as a Chief Operating Officer. She's been falling more in the last 6 months. She denies any prodrome. She denies syncope or near-syncope. She denies dizziness or lightheadedness. She does note palpitations. He seem to be more frequent. They do not seem to be related to her falling. She denies chest pain. She does have dyspnea with exertion. She probably describes class IIb symptoms. This overall seems to be stable. She denies orthopnea, PND or edema.   Wt Readings from Last 3 Encounters:  12/28/11 148 lb 1.9 oz (67.187 kg)  12/13/11 152 lb 4 oz (69.06 kg)  10/12/11 149 lb 4 oz (67.699 kg)     Past Medical History  Diagnosis Date  . Hypertension   . Coronary artery disease     CABG 1998  . Anxiety   . Diastolic dysfunction   . Hyperlipidemia   . Peptic ulcer disease   . Anemia   . Depression   . Diabetes mellitus     type 2, followed by endo.  . Diverticulosis     . Hemorrhoids   . History of heart attack   . Depression   . Irritable bowel syndrome   . Chronic back pain   . Varicose veins   . Cystocele     Current Outpatient Prescriptions  Medication Sig Dispense Refill  . ALPRAZolam (XANAX) 1 MG tablet Take 0.5 mg by mouth 4 (four) times daily. For anxiety      . Ascorbic Acid (VITAMIN C PO) Take 1 tablet by mouth daily.       Marland Kitchen aspirin 81 MG tablet Take 81 mg by mouth daily.       Marland Kitchen CALCIUM PO Take 1 tablet by mouth daily.      . Cyanocobalamin (VITAMIN B 12 PO) Take 1 tablet by mouth daily.      Marland Kitchen glucosamine-chondroitin 500-400 MG tablet Take 1 tablet by mouth daily.        Marland Kitchen HYDROcodone-acetaminophen (VICODIN) 5-500 MG per tablet Take 0.5 tablets by mouth every 6 (six) hours as needed for pain.  60 tablet  0  . Multiple Vitamin (MULTIVITAMIN) tablet Take 1 tablet by mouth daily.        . naproxen (NAPROSYN) 375 MG tablet Take 375 mg by mouth 2 (two) times daily with a meal.      . NEXIUM 40 MG capsule TAKE 1 TABLET  ONCE A DAY ORALLY  30 capsule  1  . nitroGLYCERIN (NITROSTAT) 0.4 MG SL tablet Place 0.4 mg under the tongue every 5 (five) minutes as needed. For chest pain May take up to 3 doses, if pain persists call 911.      . nystatin-triamcinolone ointment (MYCOLOG) APPLY TO AFFECTED AREA TWICE A DAY  30 g  1  . pioglitazone (ACTOS) 45 MG tablet Take 45 mg by mouth daily.        . polyethylene glycol (MIRALAX / GLYCOLAX) packet Take 17 g by mouth daily.      . pravastatin (PRAVACHOL) 20 MG tablet Take 20 mg by mouth daily.      . ramipril (ALTACE) 10 MG capsule Take 10 mg by mouth daily.      . traZODone (DESYREL) 150 MG tablet Take 150 mg by mouth at bedtime.        . triamterene-hydrochlorothiazide (DYAZIDE) 37.5-25 MG per capsule 1/2 tab by mouth daily.      . Venlafaxine HCl 37.5 MG TB24 Take 1 tablet (37.5 mg total) by mouth daily.  30 each  3  . VITAMIN E PO Take 1 tablet by mouth daily.         Allergies: Allergies  Allergen  Reactions  . Amoxicillin-Pot Clavulanate Diarrhea    History  Substance Use Topics  . Smoking status: Never Smoker   . Smokeless tobacco: Never Used  . Alcohol Use: No     ROS:  Please see the history of present illness.   She denies any symptoms consistent with amaurosis fugax or TIAs. She denies melena, hematochezia. She notes her hormone replacement therapy was recently discontinued.  All other systems reviewed and negative.   PHYSICAL EXAM: VS:  BP 142/60  Pulse 74  Ht 4\' 10"  (1.473 m)  Wt 148 lb 1.9 oz (67.187 kg)  BMI 30.96 kg/m2 Well nourished, well developed, in no acute distress HEENT: normal Neck: no JVD at 90 Cardiac:  Distant S1, S2; RRR; no murmur Lungs:  clear to auscultation bilaterally, no wheezing, rhonchi or rales Abd: soft, nontender, no hepatomegaly Ext: no edema Skin: warm and dry Neuro:  CNs 2-12 intact, no focal abnormalities noted  EKG:  NSR, HR 74, normal axis, no ischemic changes      ASSESSMENT AND PLAN:  1. Palpitations: He seemed to be fairly bothersome for her. She is certainly at risk for atrial fibrillation given her age and comorbidities. I have recommended that we proceed with an echocardiogram as well as and monitor. I will also have her undergo lab work today to include a basic metabolic panel, magnesium, TSH, CBC. She will be brought back in followup with Dr. Acie Fredrickson in 4-6 weeks.  2. Dyspnea: This is fairly chronic. She does not appear to be volume overloaded on exam. To be complete, I will check a BNP with her blood work today. If this is significantly elevated, we will adjust her diuretic Lasix. As noted, an echocardiogram will be obtained.  3. Coronary Artery Disease: She does not appear to be having any symptoms of angina. Continue aspirin and statin.  4. Falls: This seems to be mechanical. If her cardiac workup is unremarkable, I would suggest that she consider referral to physical therapy for balance intervention.  Signed, [name], PA-C  3:28 PM 12/28/2011

## 2011-12-29 ENCOUNTER — Telehealth: Payer: Self-pay | Admitting: *Deleted

## 2011-12-29 LAB — CBC WITH DIFFERENTIAL/PLATELET
Basophils Absolute: 0.1 10*3/uL (ref 0.0–0.1)
HCT: 34.8 % — ABNORMAL LOW (ref 36.0–46.0)
Hemoglobin: 10.7 g/dL — ABNORMAL LOW (ref 12.0–15.0)
Lymphs Abs: 1.4 10*3/uL (ref 0.7–4.0)
MCHC: 30.7 g/dL (ref 30.0–36.0)
Monocytes Relative: 7.9 % (ref 3.0–12.0)
Neutro Abs: 5.4 10*3/uL (ref 1.4–7.7)
RDW: 23.4 % — ABNORMAL HIGH (ref 11.5–14.6)

## 2011-12-29 LAB — BASIC METABOLIC PANEL
CO2: 29 mEq/L (ref 19–32)
Glucose, Bld: 79 mg/dL (ref 70–99)
Potassium: 4.9 mEq/L (ref 3.5–5.1)
Sodium: 138 mEq/L (ref 135–145)

## 2011-12-29 NOTE — Telephone Encounter (Signed)
pt notified about lab results w/verbal understanding today 

## 2011-12-29 NOTE — Telephone Encounter (Signed)
Message copied by Michae Kava on Thu Dec 29, 2011  5:06 PM ------      Message from: [place], [place] T      Created: Thu Dec 29, 2011  2:07 PM       She is anemic.  Hgb is unchanged over last several mos.  Follow up with PCP for management.      TSH ok.      Renal fxn, K+, Mg2+ ok.      BNP not significantly abnormal.      Continue with current treatment plan.      [name], PA-C  2:06 PM 12/29/2011

## 2012-01-02 ENCOUNTER — Other Ambulatory Visit: Payer: Self-pay | Admitting: Family Medicine

## 2012-01-02 NOTE — Telephone Encounter (Signed)
Rx called to pharmacy as instructed. 

## 2012-01-02 NOTE — Telephone Encounter (Signed)
Received refill request electronically. Last office visit 12/13/11. Is it okay to refill medication?

## 2012-01-02 NOTE — Telephone Encounter (Signed)
Plz phone in

## 2012-01-06 ENCOUNTER — Telehealth: Payer: Self-pay

## 2012-01-06 MED ORDER — VENLAFAXINE HCL ER 75 MG PO CP24: 75.0000 mg | ORAL_CAPSULE | Freq: Every day | ORAL | Status: DC

## 2012-01-06 NOTE — Telephone Encounter (Signed)
I recommend increase in dose to 2 pills daily - have sent new prescription for 75mg  daily.  Do this for the next month and notify us with effect.

## 2012-01-06 NOTE — Telephone Encounter (Signed)
Pt has been taking med but does not see any improvement in hot flashes or anxiety. Pt wanted to know if should refill med or what Dr [name] advise. [facility].Please advise.

## 2012-01-09 NOTE — Telephone Encounter (Signed)
Patient notified and will call with an update in 1 month.

## 2012-01-10 ENCOUNTER — Ambulatory Visit (HOSPITAL_COMMUNITY): Payer: Medicare Other | Attending: Cardiology | Admitting: Radiology

## 2012-01-10 ENCOUNTER — Encounter (INDEPENDENT_AMBULATORY_CARE_PROVIDER_SITE_OTHER): Payer: Medicare Other

## 2012-01-10 DIAGNOSIS — I2510 Atherosclerotic heart disease of native coronary artery without angina pectoris: Secondary | ICD-10-CM

## 2012-01-10 DIAGNOSIS — R002 Palpitations: Secondary | ICD-10-CM | POA: Diagnosis not present

## 2012-01-10 DIAGNOSIS — I059 Rheumatic mitral valve disease, unspecified: Secondary | ICD-10-CM | POA: Diagnosis not present

## 2012-01-10 DIAGNOSIS — R0602 Shortness of breath: Secondary | ICD-10-CM

## 2012-01-10 DIAGNOSIS — E119 Type 2 diabetes mellitus without complications: Secondary | ICD-10-CM | POA: Insufficient documentation

## 2012-01-10 DIAGNOSIS — R0609 Other forms of dyspnea: Secondary | ICD-10-CM | POA: Insufficient documentation

## 2012-01-10 DIAGNOSIS — I10 Essential (primary) hypertension: Secondary | ICD-10-CM | POA: Insufficient documentation

## 2012-01-10 DIAGNOSIS — I369 Nonrheumatic tricuspid valve disorder, unspecified: Secondary | ICD-10-CM | POA: Insufficient documentation

## 2012-01-10 DIAGNOSIS — R0989 Other specified symptoms and signs involving the circulatory and respiratory systems: Secondary | ICD-10-CM | POA: Diagnosis not present

## 2012-01-10 NOTE — Progress Notes (Signed)
Echocardiogram performed.  

## 2012-01-12 ENCOUNTER — Encounter: Payer: Self-pay | Admitting: Physician Assistant

## 2012-01-17 ENCOUNTER — Other Ambulatory Visit: Payer: Self-pay | Admitting: Family Medicine

## 2012-01-23 ENCOUNTER — Telehealth: Payer: Self-pay | Admitting: Cardiovascular Disease

## 2012-01-23 DIAGNOSIS — Z23 Encounter for immunization: Secondary | ICD-10-CM | POA: Diagnosis not present

## 2012-01-23 DIAGNOSIS — E7800 Pure hypercholesterolemia, unspecified: Secondary | ICD-10-CM | POA: Diagnosis not present

## 2012-01-23 DIAGNOSIS — E119 Type 2 diabetes mellitus without complications: Secondary | ICD-10-CM | POA: Diagnosis not present

## 2012-01-23 NOTE — Telephone Encounter (Signed)
No answer, just rings.

## 2012-01-23 NOTE — Telephone Encounter (Signed)
Called back no answer 

## 2012-01-23 NOTE — Telephone Encounter (Signed)
New problem:  Returning call back to someone - regarding battery

## 2012-01-24 NOTE — Telephone Encounter (Signed)
PT WEARING A MONITOR, TOLD HER TO CALL THE TOLL FREE NUMBER, SHE ALREADY CHANGED THE BATTERY. SHE HAS NOT HEARD FROM THEM AGAIN, TOLD HER THEY WILL CALL IF THERE IS A FURTHER PROBLEM.

## 2012-01-24 NOTE — Telephone Encounter (Signed)
Follow-up:    Patient returned your call.  Please call back. 

## 2012-02-02 ENCOUNTER — Telehealth: Payer: Self-pay | Admitting: Family Medicine

## 2012-02-02 NOTE — Telephone Encounter (Signed)
Caller: Noheli/Patient; Patient Name: [name]; PCP: [facility]); Best Callback Phone Number: [phone]. Pt. was placed on Effexor 75mg , and at a later date was to couble this dose as documented on 01/06/12, but the pt. states she did not get this message, and has continued to take 75 mg per day.  She states that her hot flashes are unbearable, and wants to discontinue the Effexor.  Triaged per Menopause, Diaganosed or Suspected; and all emergent sx. r/o per guidelines.  Disposition:  See Provider within 2 weeks for:  Hot flashes or night sweats and not responding to treatment prescribed by provider.  Appointment set up with Dr. Danise Mina at 2:30pm on 02/03/12.   Home care instructions given.  db/CAN

## 2012-02-02 NOTE — Telephone Encounter (Signed)
will see then. 

## 2012-02-03 ENCOUNTER — Ambulatory Visit (INDEPENDENT_AMBULATORY_CARE_PROVIDER_SITE_OTHER): Payer: Medicare Other | Admitting: Family Medicine

## 2012-02-03 ENCOUNTER — Encounter: Payer: Self-pay | Admitting: Family Medicine

## 2012-02-03 VITALS — BP 144/80 | HR 84 | Temp 99.1°F | Wt 150.8 lb

## 2012-02-03 DIAGNOSIS — F411 Generalized anxiety disorder: Secondary | ICD-10-CM | POA: Diagnosis not present

## 2012-02-03 DIAGNOSIS — K279 Peptic ulcer, site unspecified, unspecified as acute or chronic, without hemorrhage or perforation: Secondary | ICD-10-CM | POA: Diagnosis not present

## 2012-02-03 DIAGNOSIS — F419 Anxiety disorder, unspecified: Secondary | ICD-10-CM

## 2012-02-03 DIAGNOSIS — R232 Flushing: Secondary | ICD-10-CM

## 2012-02-03 DIAGNOSIS — N951 Menopausal and female climacteric states: Secondary | ICD-10-CM | POA: Diagnosis not present

## 2012-02-03 MED ORDER — ESTRADIOL 0.5 MG PO TABS: 0.5000 mg | ORAL_TABLET | Freq: Every day | ORAL | Status: DC

## 2012-02-03 MED ORDER — VENLAFAXINE HCL ER 37.5 MG PO CP24: 37.5000 mg | ORAL_CAPSULE | Freq: Every day | ORAL | Status: DC

## 2012-02-03 NOTE — Progress Notes (Signed)
  Subjective:    [name], female    DOB: 1927-10-03, 78 y.o.   MRN: [id number]  HPI CC: worsening hot flashes  Mrs. Lebeda is distraught today.  Presents in tears and inconsolable because of her hot flashes.  Describes face flushing and turning red with inceased sweats.  Also endorses decreased energy and fatigue.  This has all occurred since stopping oral estrogen.  Adamant about wanting to restart estrogen.  Oral estrogen was stopped because of concerns for cardiovascular side effects given age and history.  She has significant coronary history with CABG done 1998.  Both myself and gynecologist recommended she stop oral estrogen.  Venlafaxine was started first at 37.5mg  then increased to 75mg  XR daily.  Reports no improvement in hot flashes on this med, actually worsening.  Also endorses worsening GERD sxs - described as burning in chest.  Taking nexium daily.  Past Medical History  Diagnosis Date  . Hypertension   . Coronary artery disease     CABG 1998  . Anxiety   . Diastolic dysfunction   . Hyperlipidemia   . Peptic ulcer disease   . Anemia   . Depression   . Diabetes mellitus     type 2, followed by endo.  . Diverticulosis   . Hemorrhoids   . History of heart attack   . Depression   . Irritable bowel syndrome   . Chronic back pain   . Varicose veins   . Cystocele   . Hx of echocardiogram     a. Echo 10/13: mild LVH, EF 55-60%, Gr 1 diast dysfn, PASP 32    Review of Systems Per HPI    Objective:   Physical Exam  Nursing note and vitals reviewed. Constitutional: She appears well-developed and well-nourished. No distress.       AFVSS. Slightly hypertensive. Flushed  Psychiatric: Her speech is normal. Her mood appears anxious. Her affect is angry. She is agitated. Cognition and memory are normal.       Sobbing because of hot flashes       Assessment & Plan:

## 2012-02-03 NOTE — Patient Instructions (Addendum)
Restart estrogen - sent into pharmacy.  We have discussed increased heart and stroke risk of this medication. Think about black cohosh - supplement that can help with hot flashes. Let's take 37.5mg  effexor daily for 2 weeks then stop.  I've sent in 2 weeks of this medicine. If burning in abdomen continues, take ranitidine (zantac) 150mg  at night time.

## 2012-02-05 DIAGNOSIS — K279 Peptic ulcer, site unspecified, unspecified as acute or chronic, without hemorrhage or perforation: Secondary | ICD-10-CM | POA: Insufficient documentation

## 2012-02-05 NOTE — Assessment & Plan Note (Signed)
Recently worsening dyspepsia. rec continue nexium 40mg  daily, may add ranitidine nightly if needed.

## 2012-02-05 NOTE — Assessment & Plan Note (Signed)
Obviously exacerbated by her hot flashes.  See below.

## 2012-02-05 NOTE — Assessment & Plan Note (Signed)
Pt adamant about wanting to restart HRT. States has been on long term, and had never had thermoregulatory issues to this extend when on them prior. Again discussed in detail risks of HRT esp given her age and CV hx (namely risk of heart attack, stroke, and blood clots).  Pt endorses understanding of these risks and desires to restart HRT. Will restart estradiol at 0.5mg  daily, consider increase to 1mg  daily if no relief noted. Did emphasize need to return for further eval of flushing/hot flashes if not improved with estrogen therapy. Pt strongly desires to stop effexor, will decrease to 37.5mg  daily for 2 wks then off. Continue xanax as using currently (bid with extra 3rd occasionally)

## 2012-02-06 ENCOUNTER — Other Ambulatory Visit: Payer: Self-pay | Admitting: Family Medicine

## 2012-02-06 NOTE — Telephone Encounter (Signed)
Ok to refill 

## 2012-02-06 NOTE — Telephone Encounter (Signed)
plz phone in. 

## 2012-02-06 NOTE — Telephone Encounter (Signed)
Rx called in as directed.   

## 2012-02-07 ENCOUNTER — Ambulatory Visit (INDEPENDENT_AMBULATORY_CARE_PROVIDER_SITE_OTHER): Payer: Medicare Other | Admitting: Cardiovascular Disease

## 2012-02-07 ENCOUNTER — Encounter: Payer: Self-pay | Admitting: Cardiovascular Disease

## 2012-02-07 ENCOUNTER — Telehealth: Payer: Self-pay | Admitting: Cardiovascular Disease

## 2012-02-07 VITALS — BP 150/66 | HR 81 | Ht <= 58 in | Wt 151.8 lb

## 2012-02-07 DIAGNOSIS — I2510 Atherosclerotic heart disease of native coronary artery without angina pectoris: Secondary | ICD-10-CM | POA: Diagnosis not present

## 2012-02-07 NOTE — Progress Notes (Signed)
Taylor Date of Birth  10/28/1927 [facility] [address]    [place], [place]  [address] [phone]  Fax  [phone]  History of Present Illness:  [name] is an 78 yo female with a Hx of CAD, CABG ( November 09, 1994), diabetes mellitus, anxiety, HTN, hyperlipidemia.    She's been under a lot of stress recently. Her son died last year. She's under a lot of stress with the extra expenses involved such as his funeral.  She presented with some CP in December.  A stress myoview revealed lateral ischemia.  We offered cardiac cath but she did not want to have the cath.  She presents today without any particular complaints. She has not had any episodes of chest pain or shortness of breath since I saw her a month ago. She does have occasional episodes of tachycardia. She also notes that she has some anxiety.  She was last seen in January after having an abnormal stress test that revealed anterior apical ischemia. She refused cardiac catheterization at that time. She has not had any recent episodes of chest pain.  She does not want to have a cardiac catheterization since she's feeling so well.   She has had some chest burning that is nearly constant.  I suggest several times that she should consider getting the cath done and she changed to subject.  Current Outpatient Prescriptions on File Prior to Visit  Medication Sig Dispense Refill  . ALPRAZolam (XANAX) 1 MG tablet Take 0.5 mg by mouth 4 (four) times daily. For anxiety      . Ascorbic Acid (VITAMIN C PO) Take 1 tablet by mouth daily.       Marland Kitchen aspirin 81 MG tablet Take 81 mg by mouth daily.       Marland Kitchen CALCIUM PO Take 1 tablet by mouth daily.      . Cyanocobalamin (VITAMIN B 12 PO) Take 1 tablet by mouth daily.      Marland Kitchen estradiol (ESTRACE) 0.5 MG tablet Take 1 tablet (0.5 mg total) by mouth daily. DOSE UNKNOWN  30 tablet  3  . glucosamine-chondroitin 500-400 MG tablet Take 1 tablet by mouth daily.        Marland Kitchen  HYDROcodone-acetaminophen (VICODIN) 5-500 MG per tablet TAKE 1/2 TABLET BY MOUTH EVERY 6 HOURS AS NEEDED FOR PAIN  60 tablet  0  . Multiple Vitamin (MULTIVITAMIN) tablet Take 1 tablet by mouth daily.        . naproxen (NAPROSYN) 375 MG tablet Take 375 mg by mouth 2 (two) times daily with a meal.      . NEXIUM 40 MG capsule TAKE ONE CAPSULE BY MOUTH EVERY DAY  30 capsule  3  . nitroGLYCERIN (NITROSTAT) 0.4 MG SL tablet Place 0.4 mg under the tongue every 5 (five) minutes as needed. For chest pain May take up to 3 doses, if pain persists call 911.      . nystatin-triamcinolone ointment (MYCOLOG) APPLY TO AFFECTED AREA TWICE A DAY  30 g  1  . pioglitazone (ACTOS) 45 MG tablet Take 45 mg by mouth daily.        . polyethylene glycol (MIRALAX / GLYCOLAX) packet Take 8.5 g by mouth daily.       . pravastatin (PRAVACHOL) 20 MG tablet TAKE 1 TABLET BY MOUTH DAILY.  30 tablet  3  . ramipril (ALTACE) 10 MG capsule Take 10 mg by mouth daily.      . traZODone (DESYREL)  150 MG tablet Take 150 mg by mouth at bedtime.        . triamterene-hydrochlorothiazide (DYAZIDE) 37.5-25 MG per capsule 1/2 tab by mouth daily.      Marland Kitchen venlafaxine XR (EFFEXOR-XR) 37.5 MG 24 hr capsule Take 1 capsule (37.5 mg total) by mouth daily.  14 capsule  0  . VITAMIN E PO Take 1 tablet by mouth daily.       . [DISCONTINUED] pravastatin (PRAVACHOL) 20 MG tablet Take 20 mg by mouth daily.        Allergies  Allergen Reactions  . Amoxicillin-Pot Clavulanate Diarrhea    Past Medical History  Diagnosis Date  . Hypertension   . Coronary artery disease     CABG 1998  . Anxiety   . Diastolic dysfunction   . Hyperlipidemia   . Peptic ulcer disease   . Anemia   . Depression   . Diabetes mellitus     type 2, followed by endo.  . Diverticulosis   . Hemorrhoids   . History of heart attack   . Depression   . Irritable bowel syndrome   . Chronic back pain   . Varicose veins   . Cystocele   . Hx of echocardiogram     a. Echo  10/13: mild LVH, EF 55-60%, Gr 1 diast dysfn, PASP 32    Past Surgical History  Procedure Date  . Tympanoplasty 1960s    R side  . Coronary artery bypass graft 1998  . Cataract surgery   . Cholecystectomy   . Appendectomy     per pt report  . Colonoscopy 02/2010    extensive diverticulosis throughout, small internal hemorrhoids, rec rpt 5 yrs (Dr. Allyn Kenner)  . Hand surgery   . Lexiscan myoview 03/2011    Small basal inferolateral and anterolateral reversible perfusion defect suggests ischemia.  EF was normal.   old infarct, no new ischemia  . Toe surgery   . Abdominal hysterectomy 1975    TAH,BSO  . Vault prolapse a & p repair 2009    Dr [facility] hospital  . Dexa scan 09/2011    femur -0.8, forearm 0.6 => normal    History  Smoking status  . Never Smoker   Smokeless tobacco  . Never Used    History  Alcohol Use No    Family History  Problem Relation Age of Onset  . Hypertension Mother   . Stroke Mother   . Diabetes Father   . Diabetes Sister   . Diabetes Brother   . Colon cancer Neg Hx     Reviw of Systems:  Reviewed in the HPI.  All other systems are negative.  Physical Exam: BP 150/66  Pulse 81  Ht 4\' 10"  (1.473 m)  Wt 151 lb 12.8 oz (68.856 kg)  BMI 31.73 kg/m2 The patient is alert and oriented x 3.  The mood and affect are normal.   Skin: warm and dry.  Color is normal.    HEENT:   Normocephalic/atraumatic. Neck is supple. Her mucous membranes are moist. The JVD. Her carotids are normal.  Lungs: Lung exam is clear.   Heart: Regular rate S1-S2. She has no murmurs.    Abdomen: Abdomen soft.  Extremities:  No clubbing cyanosis or edema  Neuro:  Exam is nonfocal.    ECG: Normal sinus rhythm with occasional premature supraventricular complexes.  Assessment / Plan:

## 2012-02-07 NOTE — Patient Instructions (Addendum)
Your physician wants you to follow-up in: 6 Months You will receive a reminder letter in the mail two months in advance. If you don't receive a letter, please call our office to schedule the follow-up appointment.  Your physician recommends that you continue on your current medications as directed. Please refer to the Current Medication list given to you today.

## 2012-02-07 NOTE — Telephone Encounter (Signed)
Pt was in today and was told she didn't need to come back for 6 months, then on her appt list she has lab work 11-12, does she need this? pls call son rick

## 2012-02-07 NOTE — Telephone Encounter (Signed)
[name] // pcc will return the call, pt does not need to be seen for 6 months

## 2012-02-07 NOTE — Assessment & Plan Note (Signed)
[name] has continued to have chest burning.  She had a mildly abnormal stress test ( which may have been artifact) a year ago and has not had any worsening of her sypmtoms.  Most of her symptoms sound atypical and are likely to to GERD.    Will continue to observe.  I told her to call for worsening sx.

## 2012-02-09 ENCOUNTER — Telehealth: Payer: Self-pay | Admitting: *Deleted

## 2012-02-09 NOTE — Telephone Encounter (Signed)
Gave results of life watch to son/ nsr/ pac's/ pvc's

## 2012-02-10 ENCOUNTER — Encounter: Payer: Self-pay | Admitting: Cardiovascular Disease

## 2012-02-14 ENCOUNTER — Other Ambulatory Visit: Payer: Medicare Other

## 2012-02-15 ENCOUNTER — Encounter: Payer: Self-pay | Admitting: Cardiovascular Disease

## 2012-02-16 ENCOUNTER — Encounter: Payer: Self-pay | Admitting: Cardiovascular Disease

## 2012-02-16 ENCOUNTER — Encounter: Payer: Self-pay | Admitting: Cardiology

## 2012-02-20 ENCOUNTER — Telehealth: Payer: Self-pay

## 2012-02-20 ENCOUNTER — Other Ambulatory Visit: Payer: Self-pay | Admitting: Gynecology

## 2012-02-20 NOTE — Telephone Encounter (Signed)
Will see then. 

## 2012-02-20 NOTE — Telephone Encounter (Signed)
Pt came to office to drop off handicap parking form; [place] desk noticed lower lip bluish. Pt states feels SOB with some difficulty breathing ( not much more than usual), burning in chest but no chest pain; H/A with dizziness on and off. This AM at breakfast BS 368 and at 10:45 am 334. Pt states under emotional stress. Pulse ox 97% P 85 at rest. After walking 2 laps in lobby Pulse ox 94% and P 106. Dr Leo Grosser said can see pt tomorrow; pt scheduled 02/21/12 at 11:15 am. If condition changes or worsens pt will go to UC. Pt voiced understanding and was comfortable with that plan.

## 2012-02-21 ENCOUNTER — Ambulatory Visit (INDEPENDENT_AMBULATORY_CARE_PROVIDER_SITE_OTHER): Payer: Medicare Other | Admitting: Family Medicine

## 2012-02-21 ENCOUNTER — Encounter: Payer: Self-pay | Admitting: Family Medicine

## 2012-02-21 VITALS — BP 124/62 | HR 84 | Temp 98.1°F | Wt 152.0 lb

## 2012-02-21 DIAGNOSIS — F411 Generalized anxiety disorder: Secondary | ICD-10-CM

## 2012-02-21 DIAGNOSIS — M545 Low back pain: Secondary | ICD-10-CM | POA: Diagnosis not present

## 2012-02-21 DIAGNOSIS — F419 Anxiety disorder, unspecified: Secondary | ICD-10-CM

## 2012-02-21 DIAGNOSIS — R1013 Epigastric pain: Secondary | ICD-10-CM

## 2012-02-21 DIAGNOSIS — R232 Flushing: Secondary | ICD-10-CM

## 2012-02-21 DIAGNOSIS — N951 Menopausal and female climacteric states: Secondary | ICD-10-CM | POA: Diagnosis not present

## 2012-02-21 MED ORDER — DEXLANSOPRAZOLE 30 MG PO CPDR: 30.0000 mg | DELAYED_RELEASE_CAPSULE | Freq: Every day | ORAL | Status: DC

## 2012-02-21 NOTE — Patient Instructions (Addendum)
May cancel appointment for next week.  Make appointment for 1 month for follow up. I'm glad hot flashes are slowing down. For burning in chest, change nexium for dexilant.  If that doesn't help, let me know and we would refer you to stomach doctor.  Samples and prescription provided. If abdominal pain continuing, let me know and I will schedule ultrasound of abdomen.

## 2012-02-21 NOTE — Assessment & Plan Note (Addendum)
Epigastric burning anticipate due to poorly controlled GERD despite daily nexium 40mg .  Reports compliance with this. Will do trial of dexilant for dual peak formulation. Doubt abd aneurysm as no bruit and nonsmoker, but did discuss abd [place] option to further evaluate abd aorta as last CT (done 2012 for previous abd pain) abd/pelvis did not mention aorta, pt prefers to try dexilant first, does not want imaging done currently. Pt agrees with plan. If unrevealing GI workup, do need to consider cardiac source given hx - pt aware of this.

## 2012-02-21 NOTE — Progress Notes (Signed)
  Subjective:    [name], female    DOB: 06-30-1927, 78 y.o.   MRN: [id number]  HPI CC: burning abd pain  [name] has history of CAD, CABG (1996), DM, anxiety, HTN, HLD, and GERD with PUD.    Presents with several complaints today.  Having continued burning sensation from epigastric region up to throat.  Present for about 1 mo. Prior was recommended continued nexium and trial of zantac at night.  Did not do this.  States burning sensation worse during day, doesn't bother her at night.  No early satiety, no weight loss, no vomiting, no fevers/chills. Also having pain in lower back.  Initially worse with exertion, now constant lower back pain.  Since last seen by me, saw [name] who recommended catheterization but pt refused.  States hot flashes are much better on estrogen.  Has stopped venlafaxine.  Never smoker.  Wt Readings from Last 3 Encounters:  02/21/12 152 lb (68.947 kg)  02/07/12 151 lb 12.8 oz (68.856 kg)  02/03/12 150 lb 12 oz (68.38 kg)    Past Medical History  Diagnosis Date  . Hypertension   . Coronary artery disease     CABG 1998  . Anxiety   . Diastolic dysfunction   . Hyperlipidemia   . Peptic ulcer disease   . Anemia   . Depression   . Diabetes mellitus     type 2, followed by endo.  . Diverticulosis   . Hemorrhoids   . History of heart attack   . Depression   . Irritable bowel syndrome   . Chronic back pain   . Varicose veins   . Cystocele   . Hx of echocardiogram     a. Echo 10/13: mild LVH, EF 55-60%, Gr 1 diast dysfn, PASP 32     Review of Systems Per HPI    Objective:   Physical Exam  Nursing note and vitals reviewed. Constitutional: She appears well-developed and well-nourished. No distress.  HENT:  Head: Normocephalic and atraumatic.  Mouth/Throat: Oropharynx is clear and moist. No oropharyngeal exudate.  Eyes: Conjunctivae normal and EOM are normal. Pupils are equal, round, and reactive to light.  Neck:  Normal range of motion. Neck supple.  Cardiovascular: Normal rate, regular rhythm, normal heart sounds and intact distal pulses.   No murmur heard. Pulmonary/Chest: Effort normal and breath sounds normal. No respiratory distress. She has no wheezes. She has no rales.  Abdominal: Soft. Bowel sounds are normal. She exhibits no distension, no abdominal bruit and no mass. There is tenderness (mild epigastric tenderness). There is no rebound and no guarding.  Musculoskeletal: She exhibits no edema.  Lymphadenopathy:    She has no cervical adenopathy.  Skin: Skin is warm and dry. No rash noted.  Psychiatric: She has a normal mood and affect.       Assessment & Plan:

## 2012-02-22 NOTE — Assessment & Plan Note (Signed)
Seems to have stabilized since restarting estrogen, with noted improvement of hot flashes.

## 2012-02-22 NOTE — Assessment & Plan Note (Signed)
Chronic lower back pain - anticipate due to degenerative arthritis.  Some concern for spinal stenosis - if continued, check lumbar films. Normal bone density last check.

## 2012-02-22 NOTE — Assessment & Plan Note (Signed)
resolved with restarting estrogen.

## 2012-02-27 ENCOUNTER — Ambulatory Visit: Payer: Medicare Other | Admitting: Family Medicine

## 2012-02-29 ENCOUNTER — Telehealth: Payer: Self-pay

## 2012-02-29 NOTE — Telephone Encounter (Signed)
Form faxed and placed with [name].

## 2012-02-29 NOTE — Telephone Encounter (Signed)
Filled and placed in Kim's box. 

## 2012-02-29 NOTE — Telephone Encounter (Signed)
Prior auth for Dexilant is required; form in Dr Synthia Innocent in box.

## 2012-03-04 DIAGNOSIS — G8929 Other chronic pain: Secondary | ICD-10-CM

## 2012-03-04 HISTORY — DX: Other chronic pain: G89.29

## 2012-03-05 NOTE — Telephone Encounter (Signed)
Form faxed back since pt on part D medicare; called [phone] spoke with Charlezette and nurse, Caryl Pina. Approved over phone for 1 year; 03/05/12 thru 03/05/13. Caryl Pina said no confirmation # but pharmacy should be able to run thru after 10 mins. Spoke with Ronnie at [facility] and prescription did go thru. Edd Arbour will notify pt.

## 2012-03-07 ENCOUNTER — Ambulatory Visit: Payer: Medicare Other | Admitting: Cardiology

## 2012-03-12 ENCOUNTER — Other Ambulatory Visit: Payer: Self-pay | Admitting: Family Medicine

## 2012-03-12 NOTE — Telephone Encounter (Signed)
Rx

## 2012-03-12 NOTE — Telephone Encounter (Signed)
plz phone in. 

## 2012-03-12 NOTE — Telephone Encounter (Signed)
Received refill request electronically. Last office visit 02/21/12. Is it okay to refill medication?

## 2012-03-12 NOTE — Telephone Encounter (Signed)
Rx called in as directed.   

## 2012-03-19 ENCOUNTER — Ambulatory Visit (INDEPENDENT_AMBULATORY_CARE_PROVIDER_SITE_OTHER): Payer: Medicare Other | Admitting: Family Medicine

## 2012-03-19 ENCOUNTER — Ambulatory Visit (INDEPENDENT_AMBULATORY_CARE_PROVIDER_SITE_OTHER)
Admission: RE | Admit: 2012-03-19 | Discharge: 2012-03-19 | Disposition: A | Discharge status: Home / Self Care | Payer: Medicare Other | Source: Ambulatory Visit | Attending: Family Medicine | Admitting: Family Medicine

## 2012-03-19 ENCOUNTER — Encounter: Payer: Self-pay | Admitting: Family Medicine

## 2012-03-19 VITALS — BP 128/64 | HR 96 | Temp 97.7°F | Wt 156.0 lb

## 2012-03-19 DIAGNOSIS — M545 Low back pain: Secondary | ICD-10-CM | POA: Diagnosis not present

## 2012-03-19 DIAGNOSIS — M47817 Spondylosis without myelopathy or radiculopathy, lumbosacral region: Secondary | ICD-10-CM | POA: Diagnosis not present

## 2012-03-19 NOTE — Patient Instructions (Addendum)
Come with Mr. [name] to fill out form up front - designated party release form. Lets get xray of lower back today and will call you with results and plan.   We may discuss shots into spine if abnormal. For sinus congestion - try over the counter immediate release guaifenesin with plenty of water. Good to see you today, call us with questions.

## 2012-03-19 NOTE — Progress Notes (Signed)
  Subjective:    [name], female    DOB: Feb 09, 1928, 78 y.o.   MRN: [id number]  HPI CC: 1 mo f/u  Ms Nocito has history of CAD, CABG (1996), DM, anxiety, HTN, HLD, and GERD with PUD.   Seen here last month with worsening abd/epigastric burning pain, nexium was changed to dexilant.  Doing better on dexilant.    Hot flashes doing better on estrogen.  Easily gets emotional, cries.  Stress at work.  Works 11 hr days.  Does not want to work less.  Also having pain in lower back that radiates down both legs. Initially worse with exertion, now constant lower back pain.  Endorses some burning in legs, but denies numbness or weakness.  Denies injury to lower back.  Longstanding urinary and bowel urgency.  Takes vicodin 1/2 pill qid.    Constipation - takes miralax daily.  Controlled with this. DM - adamant about metformin causing hyperglycemia.  Followed by Dr. Dwyane Dee. Wt Readings from Last 3 Encounters:  03/19/12 156 lb (70.761 kg)  02/21/12 152 lb (68.947 kg)  02/07/12 151 lb 12.8 oz (68.856 kg)   Lab Results  Component Value Date   HGBA1C 7.6* 01/10/2011    Past Medical History  Diagnosis Date  . Hypertension   . Coronary artery disease     CABG 1998  . Anxiety   . Diastolic dysfunction   . Hyperlipidemia   . PUD (peptic ulcer disease)   . Anemia   . Depression   . Diabetes mellitus     type 2, followed by endo.  . Diverticulosis   . Hemorrhoids   . History of heart attack   . Depression   . Irritable bowel syndrome   . Chronic back pain   . Varicose veins   . Cystocele   . Hx of echocardiogram     a. Echo 10/13: mild LVH, EF 55-60%, Gr 1 diast dysfn, PASP 32     Review of Systems Per HPI    Objective:   Physical Exam  Nursing note and vitals reviewed. Constitutional: She appears well-developed and well-nourished. No distress.       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 No tenderness to palpation of sciatic notch or GTB bilat. + tender to palpation L sacroiliac joint  Neurological: She is alert. She has normal strength. No sensory deficit.       5/5 strength BLE       Assessment & Plan:  Hot flashes, abd pain improved on current regimen, continue.

## 2012-03-19 NOTE — Assessment & Plan Note (Signed)
Anticipate chronic lower back pain due to degenerative arthritis.  ? Sacroiliitis today. Discussed treatment options - including evaluation with MRI if interested in ESI, vs controlling pain with increased hydrocodone (discussed concern with constipation as side effect as well).  Will continue vicodin for now, start workup with LS spine Avoid NSAIDs 2/2 aspirin use.  Consider voltaren gel. H/o normal bone density. Xray - mild lumbar scoliosis as well as DDD of upper lumbar region with disk space narrowing throughout.  Consider MRI if pt desires to pursue possible ESI.

## 2012-04-02 ENCOUNTER — Telehealth: Payer: Self-pay | Admitting: Family Medicine

## 2012-04-02 DIAGNOSIS — K2900 Acute gastritis without bleeding: Secondary | ICD-10-CM | POA: Diagnosis not present

## 2012-04-02 DIAGNOSIS — R112 Nausea with vomiting, unspecified: Secondary | ICD-10-CM | POA: Diagnosis not present

## 2012-04-02 NOTE — Telephone Encounter (Signed)
Advise pt to go to ER

## 2012-04-02 NOTE — Telephone Encounter (Signed)
Patient Information:  Caller Name: Savvy  Phone: [phone]  Patient: [name], [name]  Gender: Female  DOB: 17-Oct-1927  Age: 78 Years  PCP: Ria Bush [facility])  Office Follow Up:  Does the office need to follow up with this patient?: Yes  Instructions For The Office: Pt refuses to go to the ED.  RN tried to find an appt at the office; not appt was available.  OFFICE, please follow up with patient.  RN Note:  pt states she has not checked her blood sugar  Symptoms  Reason For Call & Symptoms: pt reports vomiting and diarrhea began on 04/01/12.  Pt vomited x 3 and numerous diarrhea.  pt feels weak and dizzy  Reviewed Health History In EMR: Yes  Reviewed Medications In EMR: Yes  Reviewed Allergies In EMR: Yes  Reviewed Surgeries / Procedures: Yes  Date of Onset of Symptoms: 04/01/2012  Treatments Tried: Immodium, OTC nausea medication  Treatments Tried Worked: Yes  Guideline(s) Used:  Vomiting  Disposition Per Guideline:   Go to ED Now  Reason For Disposition Reached:   Moderate vomiting (e.g., 3 - 5 times/day) and age > 25  Advice Given:  N/A  Patient Refused Recommendation:  Patient Refused Care Advice  pt refuses to go the ED;  Pt states vomiting has resolved and diarrhea has also resolved.  But she is feeling weak and dizzy

## 2012-04-02 NOTE — Telephone Encounter (Signed)
Called pt and encouraged her to go to ED per Dr Josefine Class instructions.  Pt refusing to go to ED.  No appts available at this time.  Encouraged pt to go to UC if she refuses to be seen at the ED.  Pt willing to go to UC.  Patient given directions and phone number to UC in [place] as this is the closest to her home.  Instructed pt to call us back and let us know if her s/s do not improve after being seen at [facility].

## 2012-04-02 NOTE — Telephone Encounter (Signed)
Noted  

## 2012-04-03 ENCOUNTER — Encounter: Payer: Self-pay | Admitting: Family Medicine

## 2012-04-03 ENCOUNTER — Telehealth: Payer: Self-pay | Admitting: Family Medicine

## 2012-04-03 ENCOUNTER — Ambulatory Visit (INDEPENDENT_AMBULATORY_CARE_PROVIDER_SITE_OTHER): Payer: Medicare Other | Admitting: Family Medicine

## 2012-04-03 VITALS — BP 126/62 | HR 74 | Temp 98.7°F | Ht <= 58 in | Wt 152.0 lb

## 2012-04-03 DIAGNOSIS — R197 Diarrhea, unspecified: Secondary | ICD-10-CM | POA: Insufficient documentation

## 2012-04-03 DIAGNOSIS — A084 Viral intestinal infection, unspecified: Secondary | ICD-10-CM

## 2012-04-03 DIAGNOSIS — A088 Other specified intestinal infections: Secondary | ICD-10-CM | POA: Diagnosis not present

## 2012-04-03 NOTE — Telephone Encounter (Signed)
Patient said she went to UC yesterday but was very unhappy with the environment because the doctor she saw was coughing. She couldn't tell me what they told her other than to stay hydrated. She said she is doing that and still feels horrible. Appt scheduled for today with Dr. Glori Bickers for re-eval due to upcoming holiday.

## 2012-04-03 NOTE — Patient Instructions (Addendum)
I think you have had a virus - gastroenteritis (a stomach bug) that is very contagious  For the next few days - concentrate on getting fluids - mostly water, some gatorade is ok - but avoid coffee and tea for a while since they are hard on the stomach Dehydration can add to headache and dizziness so fluids are most important  Start with the BRAT diet (bananas, rice, applesauce and toast)- in small amounts until your stomach starts feeling better - and then you can gradually add other foods

## 2012-04-03 NOTE — Telephone Encounter (Signed)
Noted.  Saw Dr. Darene Lamer today.

## 2012-04-03 NOTE — Telephone Encounter (Signed)
Aware- she will call back for appt if needed

## 2012-04-03 NOTE — Telephone Encounter (Signed)
Can we call for an update on how she's doing?  Hopefully vomiting/diarrhea has resolved.  plz encourage good hydration.

## 2012-04-03 NOTE — Assessment & Plan Note (Signed)
Overall much improved but still mild nausea and stomach irritability Disc an effort to gradually re hydrate with non caff non acidic beverages and then start BRAT diet and advance very slowly  Urged to update if worse / fever/ return of vomiting or diarrhea  Did counsel her on contagious nature of this illness as well

## 2012-04-03 NOTE — Telephone Encounter (Signed)
[facility] Triage Call Report Triage Record Num: [id number] Operator: Jeanett Schlein Patient Name: [name] Call Date & Time: 04/02/2012 5:59:20PM Patient Phone: [phone] PCP: Ria Bush Patient Gender: Female PCP Fax : [phone] Patient DOB: Nov 08, 1927 [facility] Name[facility] Reason for Call: Caller: Cason/Patient; PCP: Ria Bush (Family Practice); CB#: [phone]; Was advised earlier to go to an UC for her sx. She has not had any vomiting this afternoon. No diarrhea. Just not feeling good. She is drinking gatorade and is feeling better. Asking for an appt. for tomorrow to see her PCP. Denies any sx. Will see how she feels tomorrow and if she wants an appt. for Thursday will call in am. Protocol(s) Used: Office Note Recommended Outcome per Protocol: Information Noted and Sent to Office Reason for Outcome: Caller information to office Care Advice: ~ 04/02/2012 6:05:32PM Page 1 of 1 [email or web address]

## 2012-04-03 NOTE — Progress Notes (Signed)
Subjective:    [name], female    DOB: 1928-01-11, 78 y.o.   MRN: [id number]  HPI Started getting ill on Sunday- woke up vomiting  Tried to eat breakfast - that came back up  Son got her some anti nausea med and some imodium   Is overall feeling better - but her abdomen is still rumbling and she is still a bit nauseated and tired  Dizzy and a Stitzer headache She wants to know why she is not feeling 100% better and is upset about that   No fever  Is drinking gatorade and 7 up and pediacare Ate eggs and coffee this am   Patient Active Problem List  Diagnosis  . Coronary artery disease  . Anxiety  . Obesity  . Hypertension  . Diastolic dysfunction  . Anemia  . Diabetes type 2, uncontrolled  . Hyperlipidemia  . Skin rash  . Abdominal pain, epigastric  . Bleeding from varicose vein  . Ruptured varicose vein  . Postmenopausal  . Lower back pain  . Leg pain  . Hot flashes  . Falls  . Peptic ulcer disease   Past Medical History  Diagnosis Date  . Hypertension   . Coronary artery disease     CABG 1998  . Anxiety   . Diastolic dysfunction   . Hyperlipidemia   . PUD (peptic ulcer disease)   . Anemia   . Depression   . Diabetes mellitus     type 2, followed by endo.  . Diverticulosis   . Hemorrhoids   . History of heart attack   . Depression   . Irritable bowel syndrome   . Chronic back pain   . Varicose veins   . Cystocele   . Hx of echocardiogram     a. Echo 10/13: mild LVH, EF 55-60%, Gr 1 diast dysfn, PASP 32   Past Surgical History  Procedure Date  . Tympanoplasty 1960s    R side  . Coronary artery bypass graft 1998  . Cataract surgery   . Cholecystectomy   . Appendectomy     per pt report  . Colonoscopy 02/2010    extensive diverticulosis throughout, small internal hemorrhoids, rec rpt 5 yrs (Dr. Allyn Kenner)  . Hand surgery   . Lexiscan myoview 03/2011    Small basal inferolateral and anterolateral reversible perfusion defect  suggests ischemia.  EF was normal.   old infarct, no new ischemia  . Toe surgery   . Abdominal hysterectomy 1975    TAH,BSO  . Vault prolapse a & p repair 2009    Dr [facility] hospital  . Dexa scan 09/2011    femur -0.8, forearm 0.6 => normal   History  Substance Use Topics  . Smoking status: Never Smoker   . Smokeless tobacco: Never Used  . Alcohol Use: No   Family History  Problem Relation Age of Onset  . Hypertension Mother   . Stroke Mother   . Diabetes Father   . Diabetes Sister   . Diabetes Brother   . Colon cancer Neg Hx    Allergies  Allergen Reactions  . Amoxicillin-Pot Clavulanate Diarrhea   Current Outpatient Prescriptions on File Prior to Visit  Medication Sig Dispense Refill  . acetaminophen (TYLENOL) 500 MG tablet Take 500 mg by mouth every 8 (eight) hours as needed.      . ALPRAZolam (XANAX) 1 MG tablet Take 0.5 mg by mouth 4 (four) times daily. For anxiety      .  Ascorbic Acid (VITAMIN C PO) Take 1 tablet by mouth daily.       Marland Kitchen aspirin 81 MG tablet Take 81 mg by mouth daily.       Marland Kitchen CALCIUM PO Take 1 tablet by mouth daily.      . Cyanocobalamin (VITAMIN B 12 PO) Take 1 tablet by mouth daily.      Marland Kitchen Dexlansoprazole 30 MG capsule Take 1 capsule (30 mg total) by mouth daily.  30 capsule  3  . estradiol (ESTRACE) 0.5 MG tablet Take 1 tablet (0.5 mg total) by mouth daily. DOSE UNKNOWN  30 tablet  3  . glucosamine-chondroitin 500-400 MG tablet Take 1 tablet by mouth daily.        Marland Kitchen HYDROcodone-acetaminophen (VICODIN) 5-500 MG per tablet TAKE 1/2 TABLET BY MOUTH EVERY 6 HOURS AS NEEDED FOR PAIN  60 tablet  0  . Multiple Vitamin (MULTIVITAMIN) tablet Take 1 tablet by mouth daily.        Marland Kitchen nystatin-triamcinolone ointment (MYCOLOG) APPLY TO AFFECTED AREA TWICE A DAY  30 g  1  . pioglitazone (ACTOS) 45 MG tablet Take 45 mg by mouth daily.        . polyethylene glycol (MIRALAX / GLYCOLAX) packet Take 8.5 g by mouth daily.       . pravastatin (PRAVACHOL) 20 MG  tablet TAKE 1 TABLET BY MOUTH DAILY.  30 tablet  3  . ramipril (ALTACE) 10 MG capsule Take 10 mg by mouth daily.      . traZODone (DESYREL) 150 MG tablet Take 150 mg by mouth at bedtime.        . triamterene-hydrochlorothiazide (DYAZIDE) 37.5-25 MG per capsule 1/2 tab by mouth daily.      Marland Kitchen VITAMIN E PO Take 1 tablet by mouth daily.       . nitroGLYCERIN (NITROSTAT) 0.4 MG SL tablet Place 0.4 mg under the tongue every 5 (five) minutes as needed. For chest pain May take up to 3 doses, if pain persists call 911.          Review of Systems Review of Systems  Constitutional: Negative for fever, appetite change, and unexpected weight change. pos for fatigue Eyes: Negative for pain and visual disturbance.  Respiratory: Negative for cough and shortness of breath.   Cardiovascular: Negative for cp or palpitations    Gastrointestinal: pos for nausea (mild), and soft stool with abdominal noise/ grumbling, neg for blood in stool  Genitourinary: Negative for urgency and frequency.  Skin: Negative for pallor or rash   Neurological: Negative for weakness, light-headedness, numbness and headaches.  Hematological: Negative for adenopathy. Does not bruise/bleed easily.  Psychiatric/Behavioral: Negative for dysphoric mood. The patient is not nervous/anxious.         Objective:   Physical Exam  Constitutional: She appears well-developed and well-nourished. No distress.  HENT:  Head: Normocephalic.  Mouth/Throat: Oropharynx is clear and moist.  Eyes: Conjunctivae normal are normal. Pupils are equal, round, and reactive to light. Right eye exhibits no discharge. Left eye exhibits no discharge. No scleral icterus.  Neck: Normal range of motion. Neck supple. No thyromegaly present.  Cardiovascular: Normal rate and regular rhythm.   Pulmonary/Chest: Effort normal and breath sounds normal.  Abdominal: Soft. Bowel sounds are normal. She exhibits no distension and no mass. There is tenderness. There is no  rebound and no guarding.       Mildly tender to deep palpation in all areas  No rebound or guarding  bs are not high  pitched or tinkling  Musculoskeletal: She exhibits no edema.  Lymphadenopathy:    She has no cervical adenopathy.  Neurological: She is alert.  Skin: Skin is warm and dry. No pallor.       Good color and turgor  No tenting  Brisk capillary refil  Psychiatric: She has a normal mood and affect.          Assessment & Plan:

## 2012-04-04 HISTORY — PX: OTHER SURGICAL HISTORY: SHX169

## 2012-04-09 ENCOUNTER — Other Ambulatory Visit: Payer: Self-pay | Admitting: Family Medicine

## 2012-04-09 NOTE — Telephone Encounter (Signed)
plz phone in. 

## 2012-04-10 ENCOUNTER — Telehealth: Payer: Self-pay | Admitting: Family Medicine

## 2012-04-10 ENCOUNTER — Ambulatory Visit (INDEPENDENT_AMBULATORY_CARE_PROVIDER_SITE_OTHER): Payer: Medicare Other | Admitting: Family Medicine

## 2012-04-10 ENCOUNTER — Encounter: Payer: Self-pay | Admitting: Family Medicine

## 2012-04-10 VITALS — BP 140/72 | HR 88 | Temp 98.4°F | Wt 153.0 lb

## 2012-04-10 DIAGNOSIS — J3489 Other specified disorders of nose and nasal sinuses: Secondary | ICD-10-CM

## 2012-04-10 DIAGNOSIS — R0981 Nasal congestion: Secondary | ICD-10-CM

## 2012-04-10 NOTE — Assessment & Plan Note (Signed)
Sinus congestion with intermittent rhinorrhea. Anticipate allergic not infectious. Treat with claritin, nasal saline, and mucinex. Update if not improving as expected. rec stop amox - likely causing gi upset (h/o diarrhea with amox family in past). rec stop ibuprofen given PUD hx.

## 2012-04-10 NOTE — Telephone Encounter (Signed)
[facility] Triage Call Report Triage Record Num: [id number] Operator: Abe People Patient Name: [name] Call Date & Time: 04/09/2012 7:49:19PM Patient Phone: [phone] PCP: Ria Bush Patient Gender: Female PCP Fax : [phone] Patient DOB: 1928-03-19 Practice Name[facility] Reason for Call: Caller: Saraann/Patient; PCP: Ria Bush [facility]); CB#: [phone]; Call regarding Medication Issue; Medication(s): Ampicillin. Pt had problems with her teeth and her dentist wrote a RX for Ampicillin and she did not take it all. Now she says she has "a sinus infection" and wants to know if the Dr. will just write her a RX for more Ampicillin. Sx are that her teeth are tender and has some sinus congestion that started a few days ago. Triaged Upper Respiratory Infection and all emrgent SX R/O. Disposition is is for all other situations. Instructed Dr's will not call in Ampicillin without an evlauation. She wants me to see if Dr. Dionisio Paschal has any appts for 04/10/12. Appt made with Dr, Danise Mina for 04/11/11 at 1430. Pt requested this time. Home care and call back instrucions given. Protocol(s) Used: Upper Respiratory Infection (URI) Recommended Outcome per Protocol: Provide Home/Self Care Reason for Outcome: All other situations Care Advice: Most adults need to drink 6-10 eight-ounce glasses (1.2-2.0 liters) of fluids per day unless previously told to limit fluid intake for other medical reasons. Limit fluids that contain caffeine, sugar or alcohol. Urine will be a very light yellow color when you drink enough fluids. ~ Analgesic/Antipyretic Advice - Acetaminophen: Consider acetaminophen as directed on label or by pharmacist/provider for pain or fever PRECAUTIONS: - Use if there is no history of liver disease, alcoholism, or intake of three or more alcohol drinks per day - Only if approved by provider during pregnancy or when breastfeeding - During  pregnancy, acetaminophen should not be taken more than 3 consecutive days without telling provider - Do not exceed recommended dose or frequency ~ Analgesic/Antipyretic Advice - NSAIDs: Consider aspirin, ibuprofen, naproxen or ketoprofen for pain or fever as directed on label or by pharmacist/provider. PRECAUTIONS: - If over 30 years of age, should not take longer than 1 week without consulting provider. EXCEPTIONS: - Should not be used if taking blood thinners or have bleeding problems. - Do not use if have history of sensitivity/allergy to any of these medications; or history of cardiovascular, ulcer, kidney, liver disease or diabetes unless approved by provider. - Do not exceed recommended dose or frequency. ~ 04/09/2012 8:16:41PM Page 1 of 1 [email or web address]_[email or web address]

## 2012-04-10 NOTE — Progress Notes (Signed)
  Subjective:    [name], female    DOB: 02/25/1928, 79 y.o.   MRN: [id number]  HPI CC: ?Sinus infection  Seen here last week by Dr. Darene Lamer - vomiting and diarrhea, thought viral gastroenteritis. Treated with OTC nausea med.  That has improved.  Has been taking leftover amoxicillin from dentist (last saw them 11/2011).  Also took immodium when bowels were loose.  Now for last 9 days having slight headache, nose congested, some soreness at bridge of nose, some soreness in teeth (caps), mild PNdrainage.  Some ear congestion.  No fevers/chills, minimal coughing, no ear pain.  Unhappy with fast med because doctor was coughing.  Past Medical History  Diagnosis Date  . Hypertension   . Coronary artery disease     CABG 1998  . Anxiety   . Diastolic dysfunction   . Hyperlipidemia   . PUD (peptic ulcer disease)   . Anemia   . Depression   . Diabetes mellitus     type 2, followed by endo.  . Diverticulosis   . Hemorrhoids   . History of heart attack   . Depression   . Irritable bowel syndrome   . Chronic back pain   . Varicose veins   . Cystocele   . Hx of echocardiogram     a. Echo 10/13: mild LVH, EF 55-60%, Gr 1 diast dysfn, PASP 32     Review of Systems Per HPI    Objective:   Physical Exam  Nursing note and vitals reviewed. Constitutional: She appears well-developed and well-nourished. No distress.  HENT:  Head: Normocephalic and atraumatic.  Right Ear: External ear normal.  Left Ear: External ear normal.  Nose: Mucosal edema present. No rhinorrhea. Right sinus exhibits frontal sinus tenderness. Right sinus exhibits no maxillary sinus tenderness. Left sinus exhibits frontal sinus tenderness. Left sinus exhibits no maxillary sinus tenderness.  Mouth/Throat: Uvula is midline, oropharynx is clear and moist and mucous membranes are normal. No oropharyngeal exudate, posterior oropharyngeal edema, posterior oropharyngeal erythema or tonsillar abscesses.   Cerumen impaction bilaterally Mild frontal sinus congestion Nasal irritation  Eyes: Conjunctivae normal and EOM are normal. Pupils are equal, round, and reactive to light. No scleral icterus.  Neck: Normal range of motion. Neck supple.  Cardiovascular: Normal rate, regular rhythm, normal heart sounds and intact distal pulses.   No murmur heard. Pulmonary/Chest: Effort normal and breath sounds normal. No respiratory distress. She has no wheezes. She has no rales.  Lymphadenopathy:    She has no cervical adenopathy.  Skin: Skin is warm and dry. No rash noted.      Assessment & Plan:

## 2012-04-10 NOTE — Patient Instructions (Addendum)
I do think you have sinus congestion but no infection. Stop amoxicillin - I think this is causing the stomach upset (rolling and diarrhea). Start claritin daily to help dry up runny nose sensation.  Start nasal saline rinse several times a day. Start simple mucinex or guaifenesin with plenty of water to help mobilize mucous. Let me know how this does after a few days of all 3 measures above.

## 2012-04-10 NOTE — Telephone Encounter (Signed)
Noted. Will see then.  

## 2012-04-10 NOTE — Telephone Encounter (Signed)
Rx called in as directed.   

## 2012-04-17 ENCOUNTER — Telehealth: Payer: Self-pay

## 2012-04-17 NOTE — Telephone Encounter (Signed)
Triage Record Num: [id number] Operator: Flonnie Hailstone Patient Name: [name] Call Date & Time: 04/16/2012 6:20:14PM Patient Phone: [phone] PCP: Ria Bush Patient Gender: Female PCP Fax : [phone] Patient DOB: 1927/12/27 [facility] Name[facility] Reason for Call: Caller: [name]; PCP: Ria Bush (Family Practice); CB#: [phone]; Was seen in office 1-9 and given nasal spray, Coricidin, Mucinex and Claritin. Has intermittent earache 1-13 and unsure when started. Wants to know if should continue medicines recommended. Denies nasal congestion/cough. Afebrile. Home care advice given per Ear Symptoms protocol. Protocol(s) Used: Ear: Symptoms Recommended Outcome per Protocol: Provide Home/Self Care Reason for Outcome: Ear fullness/pressure along with symptoms of a cold/upper respiratory infection or diagnosed seasonal allergies Care Advice: ~ Call provider if symptoms do not respond to home care or symptoms interfere with sleep or normal activities

## 2012-04-17 NOTE — Telephone Encounter (Signed)
To: [facility] (After Hours Triage) Fax: [phone] From: [facility] Date/ Time: 04/16/2012 6:11 PM Taken By: Renaldo Fiddler, CSR Caller: Not Available Facility: Not Available Patient: Not Available, Not Available DOB: Not Available Phone: [phone] Reason for Call: Caller was unable to be reached on callback - No Answer

## 2012-04-17 NOTE — Telephone Encounter (Signed)
To: [facility] (After Hours Triage) Fax: [phone] From: [facility] Date/ Time: 04/16/2012 6:08 PM Taken By: Renaldo Fiddler, CSR Caller: Not Available Facility: Not Available Patient: Not Available, Not Available DOB: Not Available Phone: [phone] Reason for Call: Caller was unable to be reached on callback - No Answer

## 2012-04-17 NOTE — Telephone Encounter (Signed)
Noted. Thanks.

## 2012-04-23 DIAGNOSIS — E119 Type 2 diabetes mellitus without complications: Secondary | ICD-10-CM | POA: Diagnosis not present

## 2012-04-23 DIAGNOSIS — R609 Edema, unspecified: Secondary | ICD-10-CM | POA: Diagnosis not present

## 2012-04-30 DIAGNOSIS — F429 Obsessive-compulsive disorder, unspecified: Secondary | ICD-10-CM | POA: Diagnosis not present

## 2012-05-07 ENCOUNTER — Ambulatory Visit (INDEPENDENT_AMBULATORY_CARE_PROVIDER_SITE_OTHER): Payer: Medicare Other | Admitting: Family Medicine

## 2012-05-07 ENCOUNTER — Encounter: Payer: Self-pay | Admitting: Family Medicine

## 2012-05-07 VITALS — BP 110/64 | HR 76 | Temp 97.5°F | Ht <= 58 in | Wt 154.2 lb

## 2012-05-07 DIAGNOSIS — R109 Unspecified abdominal pain: Secondary | ICD-10-CM

## 2012-05-07 DIAGNOSIS — R197 Diarrhea, unspecified: Secondary | ICD-10-CM

## 2012-05-07 DIAGNOSIS — R21 Rash and other nonspecific skin eruption: Secondary | ICD-10-CM | POA: Diagnosis not present

## 2012-05-07 LAB — POCT URINALYSIS DIPSTICK
Glucose, UA: NEGATIVE
Leukocytes, UA: NEGATIVE
pH, UA: 6

## 2012-05-07 LAB — CBC WITH DIFFERENTIAL/PLATELET
Basophils Relative: 0.6 % (ref 0.0–3.0)
Eosinophils Relative: 1.7 % (ref 0.0–5.0)
HCT: 32.3 % — ABNORMAL LOW (ref 36.0–46.0)
Monocytes Relative: 8 % (ref 3.0–12.0)
Neutrophils Relative %: 72.1 % (ref 43.0–77.0)
Platelets: 330 10*3/uL (ref 150.0–400.0)
RBC: 4.13 Mil/uL (ref 3.87–5.11)
WBC: 7.9 10*3/uL (ref 4.5–10.5)

## 2012-05-07 LAB — COMPREHENSIVE METABOLIC PANEL
AST: 20 U/L (ref 0–37)
Albumin: 3.4 g/dL — ABNORMAL LOW (ref 3.5–5.2)
Alkaline Phosphatase: 95 U/L (ref 39–117)
BUN: 24 mg/dL — ABNORMAL HIGH (ref 6–23)
Calcium: 9 mg/dL (ref 8.4–10.5)
Creatinine, Ser: 0.9 mg/dL (ref 0.4–1.2)
Glucose, Bld: 89 mg/dL (ref 70–99)
Potassium: 4.4 mEq/L (ref 3.5–5.1)

## 2012-05-07 LAB — TSH: TSH: 1.16 u[IU]/mL (ref 0.35–5.50)

## 2012-05-07 MED ORDER — CLOTRIMAZOLE 1 % EX CREA: TOPICAL_CREAM | Freq: Two times a day (BID) | CUTANEOUS | Status: DC

## 2012-05-07 NOTE — Patient Instructions (Addendum)
For nose rash - treat with triple antibiotic ointment provided today.  Use 2 - 3 times daily for next 1 week.  Update me if rash persists. For rash inner thighs - I think this is fungal infection from recent antibiotic use - treat with clotrimazole cream twice daily for 2 weeks. For abdominal pain and diarrhea - stool samples to check for infection as well as blood work today - we will call you with results.  In interim, ensure staying well hydrated with plenty of fluids. Urine checked today - normal.  No infection.

## 2012-05-07 NOTE — Progress Notes (Signed)
  Subjective:    [name], female    DOB: 1928-01-05, 79 y.o.   MRN: [id number]  HPI CC: "I feel bad"  Spot on nose - present for several weeks.  Not healing well.    Rash between legs - present for 3 months.  Using some OTC ream.  Rash present.  Very red and initially itchy.  Now more tender.  Tried some OTC cream but unsure what this was.  Has mycolog at home but hasn't been using regularly.  Abd pain, back pain and continued diarrhea.  Vomiting x1.  NBNB, watery emesis.  Appetite down.  Diarrhea described as very loose not watery, 3+ times a day.  No blood in stool.  Going on for 1+ months now.  Initially thought viral gastroenteritis, but has persisted.  Has tread with immodium.  No fevers/chills, coughing, SOB.  now with diffuse abd pain.  Tried advil which didn't really help.  Seen by Dr. Dwyane Dee - started on metformin several months ago, immediately stopped 2/2 diarrhea.  Given different diabetes medicine that starts with V - given samples of this - but unsure what the name of it is - currently not taking.  Next appt with endo is 06/25/2012.  Seen here early 04/2012 with presumed viral gastroenteritis after self treated sinus congestion with amoxicillin.  Past Medical History  Diagnosis Date  . Hypertension   . Coronary artery disease     CABG 1998  . Anxiety   . Diastolic dysfunction   . Hyperlipidemia   . PUD (peptic ulcer disease)   . Anemia   . Depression   . Diabetes mellitus     type 2, followed by endo.  . Diverticulosis   . Hemorrhoids   . History of heart attack   . Depression   . Irritable bowel syndrome   . Chronic back pain   . Varicose veins   . Cystocele   . Hx of echocardiogram     a. Echo 10/13: mild LVH, EF 55-60%, Gr 1 diast dysfn, PASP 32     Review of Systems Per HPI    Objective:   Physical Exam  Nursing note and vitals reviewed. Constitutional: She appears well-developed and well-nourished. No distress.  HENT:  Head:  Normocephalic and atraumatic.  Mouth/Throat: Oropharynx is clear and moist. No oropharyngeal exudate.  Neck: Normal range of motion. Neck supple.  Cardiovascular: Normal rate, regular rhythm, normal heart sounds and intact distal pulses.   No murmur heard. Pulmonary/Chest: Effort normal and breath sounds normal. No respiratory distress. She has no wheezes. She has no rales.  Abdominal: Soft. Normal appearance. She exhibits no distension and no mass. Bowel sounds are increased. There is no hepatosplenomegaly. There is tenderness (mild diffuse, moderate suprapubic tenderness). There is no rigidity, no rebound, no guarding and negative Murphy's sign.  Musculoskeletal: She exhibits no edema.       Diffuse reticular and varicose veins bilateral lower extremities No ulcers present  Skin: Skin is warm. Rash noted.       Erythematous scaly rash groin and inner thighs, pruritic Erythematous vulva as well  Erythematous scab left nasal bridge, erythematous macule inferior right nasal bridge       Assessment & Plan:

## 2012-05-07 NOTE — Addendum Note (Signed)
Addended by: Royann Shivers A on: 05/07/2012 02:12 PM   Modules accepted: Orders

## 2012-05-07 NOTE — Assessment & Plan Note (Addendum)
In setting of recent viral gastroenteritis along with recent amoxicillin use. Continued diarrhea endorsed for 1+ months Given recent abx use - will check for c diff, will also send for stool cultures. In interim, advised drink plenty of fluid to ensure staying well hydrated. For suprapubic pain - check UA. Blood work today as well.  Will call with results.

## 2012-05-07 NOTE — Assessment & Plan Note (Signed)
Groin skin rash - anticipate candidal infection given location and recent abx use.  Treat with clotrimazole cream for next 2 weeks. For nose rash - looks like poorly healing small scab - treat with regular triple abx ointment for next week, update me if persistent.

## 2012-05-08 DIAGNOSIS — R197 Diarrhea, unspecified: Secondary | ICD-10-CM | POA: Diagnosis not present

## 2012-05-11 ENCOUNTER — Telehealth: Payer: Self-pay

## 2012-05-11 NOTE — Telephone Encounter (Signed)
Please notify - may pick up over the counter triple antibiotic ointment and use on nose. Groin rash - needs to use cream for 2 weeks - if not better with this, start using moisturizer at area - possible dry skin rash. Stool specimen so far no growth, but awaiting c diff and final stool culture read.

## 2012-05-11 NOTE — Telephone Encounter (Signed)
Patient notified

## 2012-05-11 NOTE — Telephone Encounter (Signed)
Pt left v/m; pt seen 05/07/12 and 2 spots on nose are better but not gone and pt is almost out of ointment. The rash between her legs is no better. Also has not heard from stool specimen.Please advise.

## 2012-05-12 LAB — STOOL CULTURE

## 2012-05-12 LAB — CLOSTRIDIUM DIFFICILE EIA: CDIFTX: NEGATIVE

## 2012-05-14 ENCOUNTER — Ambulatory Visit (INDEPENDENT_AMBULATORY_CARE_PROVIDER_SITE_OTHER): Payer: Medicare Other | Admitting: Family Medicine

## 2012-05-14 ENCOUNTER — Encounter: Payer: Self-pay | Admitting: Family Medicine

## 2012-05-14 VITALS — BP 128/62 | HR 84 | Temp 98.2°F | Wt 154.2 lb

## 2012-05-14 DIAGNOSIS — R109 Unspecified abdominal pain: Secondary | ICD-10-CM | POA: Insufficient documentation

## 2012-05-14 DIAGNOSIS — R197 Diarrhea, unspecified: Secondary | ICD-10-CM

## 2012-05-14 MED ORDER — CIPROFLOXACIN HCL 500 MG PO TABS: 500.0000 mg | ORAL_TABLET | Freq: Two times a day (BID) | ORAL | Status: DC

## 2012-05-14 NOTE — Patient Instructions (Addendum)
Make sure to hold miralax when you have diarrhea. I wonder if lower dose of trazodone would help as well with less side effects (100mg  instead of 150mg ).  Think about taking lower trazodone dose. For diarrhea - take cipro antibiotic. Pass by [facility]'s office for referral for abdominal ultrasound. Let us know how you're doing after antibiotic course.

## 2012-05-14 NOTE — Assessment & Plan Note (Signed)
New diffuse abd pain, mild to moderate in intensity, benign abdominal exam. In h/o diverticulosis - will treat with cipro course. Obtain abd [place] to further evaluate diffuse abd pain. Recent w/u unrevealing.

## 2012-05-14 NOTE — Progress Notes (Signed)
  Subjective:    [name], female    DOB: 26-May-1927, 79 y.o.   MRN: [id number]  HPI CC: diarrhea, nausea and abd/back pain  H/o IBS, PUD and diverticulosis.  Compliant with dexilant.    Back has been hurting more.  Known lumbar DDD. Had started feeling better towards end of last week.  Friday night ate hamburger - not used to this.  At night time started with diarrhea and vomiting x5 again.  Went home and took immodium.  Saturday morning continued diarrhea, took another immodium.  Did go to work on Saturday.  Drank gatorade and chicken noodle soup.  Sunday when awoke had another episode of diarrhea.  Was unable to go to church 2/2 diarrhea.  Last night went to work again, abd discomfort but no diarrhea.  This morning - diarrhea again.  Abd pain described as "rolling" cramping pain throughout abdomen, endorses some bloating as well.  Doesn't improve with BM.   Friday night diarrhea was watery.  Next several days have been loose. Worsening GI sxs going on since 04/01/2012. Takes hydrocodone 1/2 tab qid.  Having HA today.  Trazodone - has been on 150mg  nightly for last 29 yrs.  Resistant to decrease dose.  Wt Readings from Last 3 Encounters:  05/14/12 154 lb 4 oz (69.967 kg)  05/07/12 154 lb 4 oz (69.967 kg)  04/10/12 153 lb (69.4 kg)   Past Medical History  Diagnosis Date  . Hypertension   . Coronary artery disease     CABG 1998  . Anxiety   . Diastolic dysfunction   . Hyperlipidemia   . PUD (peptic ulcer disease)   . Anemia   . Depression   . Diabetes mellitus     type 2, followed by endo.  . Diverticulosis   . Hemorrhoids   . History of heart attack   . Depression   . Irritable bowel syndrome   . Chronic back pain   . Varicose veins   . Cystocele   . Hx of echocardiogram     a. Echo 10/13: mild LVH, EF 55-60%, Gr 1 diast dysfn, PASP 32    Review of Systems Per HPI    Objective:   Physical Exam  Nursing note and vitals reviewed. Constitutional:  She appears well-developed and well-nourished. No distress.  HENT:  Mouth/Throat: Oropharynx is clear and moist. No oropharyngeal exudate.  Cardiovascular: Normal rate, regular rhythm, normal heart sounds and intact distal pulses.   No murmur heard. Pulmonary/Chest: Effort normal and breath sounds normal. No respiratory distress. She has no wheezes. She has no rales.  Abdominal: Soft. Bowel sounds are normal. She exhibits no distension and no mass. There is no hepatosplenomegaly. There is tenderness (mild diffuse tenderness). There is no rebound and no guarding.  Musculoskeletal: She exhibits no edema.  Skin: Skin is warm and dry. No rash noted.  Psychiatric: She has a normal mood and affect.      Assessment & Plan:

## 2012-05-14 NOTE — Assessment & Plan Note (Addendum)
Persistent diarrhea with abd pain and some nausea/vomiting. Recent workup including CMP and WBC unremarkable. For prolonged diarrhea in h/o diverticulosis, will treat with cipro course. Reviewed normal stool studies (cdiff and stool culture) Lab Results  Component Value Date   CREATININE 0.9 05/07/2012

## 2012-05-18 ENCOUNTER — Other Ambulatory Visit: Payer: Self-pay | Admitting: Family Medicine

## 2012-05-20 NOTE — Telephone Encounter (Signed)
plz phone in hydrocodone

## 2012-05-21 ENCOUNTER — Ambulatory Visit
Admission: RE | Admit: 2012-05-21 | Discharge: 2012-05-21 | Disposition: A | Discharge status: Home / Self Care | Payer: Medicare Other | Source: Ambulatory Visit | Attending: Family Medicine | Admitting: Family Medicine

## 2012-05-21 ENCOUNTER — Other Ambulatory Visit: Payer: Medicare Other

## 2012-05-21 DIAGNOSIS — R197 Diarrhea, unspecified: Secondary | ICD-10-CM

## 2012-05-21 DIAGNOSIS — R109 Unspecified abdominal pain: Secondary | ICD-10-CM | POA: Diagnosis not present

## 2012-05-21 DIAGNOSIS — R112 Nausea with vomiting, unspecified: Secondary | ICD-10-CM | POA: Diagnosis not present

## 2012-05-21 NOTE — Telephone Encounter (Signed)
Rx called in as directed.   

## 2012-05-22 ENCOUNTER — Telehealth: Payer: Self-pay

## 2012-05-22 ENCOUNTER — Other Ambulatory Visit: Payer: Self-pay | Admitting: Family Medicine

## 2012-05-22 MED ORDER — DICYCLOMINE HCL 20 MG PO TABS: 20.0000 mg | ORAL_TABLET | Freq: Three times a day (TID) | ORAL | Status: DC | PRN

## 2012-05-22 NOTE — Telephone Encounter (Signed)
Pt said dull abdominal pain below waistline; feels nauseated today, but also thinks will soon start vomiting and diarrhea again.( it is just the way pt feels). Pt wants to know what to do. Pt said has been happening on and off since 04/01/12.[facility].Pt wants call back after 3 pm today.

## 2012-05-22 NOTE — Telephone Encounter (Signed)
Please ensure she's holding miralax powder. Let's try medication called levsin two to three times a day as needed for abd pain for next several days. If no improvement, will switch dexilant to another PPI to see if any improvement

## 2012-05-22 NOTE — Telephone Encounter (Signed)
Patient notified. She is still holding miralax. She will try levsin and call back if no improvement.

## 2012-05-24 ENCOUNTER — Other Ambulatory Visit: Payer: Self-pay | Admitting: Family Medicine

## 2012-05-25 ENCOUNTER — Telehealth: Payer: Self-pay

## 2012-05-25 MED ORDER — HYDROCODONE-ACETAMINOPHEN 5-325 MG PO TABS: 0.5000 | ORAL_TABLET | Freq: Four times a day (QID) | ORAL | Status: DC | PRN

## 2012-05-25 NOTE — Telephone Encounter (Signed)
Pt called [facility] told pt hydrocodone apap was denied. I spoke with pharmacist at [facility] and he said no longer manufactures Hydrocodone apap 5-500. Please substitute med. Pt advised and she will ck with pharmacy later today for pain med. Pt said she is out of medication.([facility] did not get call in hydrodone apap on 05/21/12.)

## 2012-05-25 NOTE — Telephone Encounter (Signed)
plz phone in. 

## 2012-05-25 NOTE — Telephone Encounter (Signed)
Rx phoned to pharmacy.  

## 2012-05-28 ENCOUNTER — Other Ambulatory Visit: Payer: Self-pay | Admitting: Family Medicine

## 2012-05-31 ENCOUNTER — Other Ambulatory Visit: Payer: Self-pay | Admitting: Gynecology

## 2012-06-01 ENCOUNTER — Other Ambulatory Visit: Payer: Self-pay | Admitting: Family Medicine

## 2012-06-05 ENCOUNTER — Other Ambulatory Visit: Payer: Self-pay | Admitting: *Deleted

## 2012-06-05 ENCOUNTER — Other Ambulatory Visit: Payer: Self-pay | Admitting: Family Medicine

## 2012-06-05 NOTE — Telephone Encounter (Signed)
Pt calls requesting refill of Bentyl, she is almost out of med. She states that she feels better when taking, it does cause a lot of flatulence, but her abd is not hard. She also d/c the Miralax and has not had diarrhea or vomiting x 3-4 weeks. Ok to refill or does pt need to f/u?

## 2012-06-05 NOTE — Telephone Encounter (Signed)
Ok to refill 

## 2012-06-06 MED ORDER — DICYCLOMINE HCL 20 MG PO TABS: 20.0000 mg | ORAL_TABLET | Freq: Three times a day (TID) | ORAL | Status: DC | PRN

## 2012-06-06 NOTE — Telephone Encounter (Signed)
plz notify sent in. I'm glad abd is better.

## 2012-06-11 ENCOUNTER — Other Ambulatory Visit: Payer: Self-pay | Admitting: Family Medicine

## 2012-06-14 ENCOUNTER — Telehealth: Payer: Self-pay

## 2012-06-14 NOTE — Telephone Encounter (Signed)
Pt having productive cough with yellow phlegm, no fever and no congestion. Pt has amoxicillin 500 mg; pt took one pill this morning and has one pill left in bottle. Pt request amoxicillin 500 mg sent to [facility]. Pt does not want to make appt.Please advise.

## 2012-06-14 NOTE — Telephone Encounter (Signed)
We were concerned as prior PCN use (augmentin) caused prolonged diarrheal illness - so would recommend not take this. May just be viral, recommend monitoring for now, if worsening to come in for evaluation. We do not provide abx over phone.

## 2012-06-14 NOTE — Telephone Encounter (Signed)
Patient advised. Advised to treat symptoms with OTC cough medicine and to give things time to get better. She said she didn't really have a cough, but when she did cough, she coughed up colored phlegm. Advised to drink plenty of fluids to help loosen phlegm and to come in if she started to run a fever or if she wasn't better by the first of next week. She verbalized understanding.

## 2012-06-18 ENCOUNTER — Other Ambulatory Visit: Payer: Self-pay | Admitting: Family Medicine

## 2012-06-18 NOTE — Telephone Encounter (Signed)
Patient called and said she is still sick. She said she has been taking tylenol cold and sinus over the weekend with no relief and now has chest congestion and wants to know what to do. I advised that since things had only been going on x 5-6 days they were likely still viral. I advised to take mucinex with plenty of fluid, try a nasal saline spray and a humidifier. She wanted something to make her feel better now. I offered her an appt for tomorrow morning but all morning appt were "too early" and the afternoon appts were "running into work". I scheduled her for 4:00 tomorrow and she said she would call tomorrow morning if she couldn't come.

## 2012-06-18 NOTE — Telephone Encounter (Signed)
plz phone in. Will see tomorrow

## 2012-06-18 NOTE — Telephone Encounter (Signed)
Ok to refill 

## 2012-06-19 ENCOUNTER — Ambulatory Visit: Payer: Medicare Other | Admitting: Family Medicine

## 2012-06-19 NOTE — Telephone Encounter (Signed)
Rx called in as directed.   

## 2012-06-25 DIAGNOSIS — M1990 Unspecified osteoarthritis, unspecified site: Secondary | ICD-10-CM | POA: Diagnosis not present

## 2012-06-25 DIAGNOSIS — E1149 Type 2 diabetes mellitus with other diabetic neurological complication: Secondary | ICD-10-CM | POA: Diagnosis not present

## 2012-06-25 DIAGNOSIS — E1142 Type 2 diabetes mellitus with diabetic polyneuropathy: Secondary | ICD-10-CM | POA: Diagnosis not present

## 2012-07-03 ENCOUNTER — Other Ambulatory Visit: Payer: Self-pay | Admitting: Family Medicine

## 2012-07-05 ENCOUNTER — Telehealth: Payer: Self-pay | Admitting: *Deleted

## 2012-07-05 ENCOUNTER — Telehealth: Payer: Self-pay | Admitting: Family Medicine

## 2012-07-05 NOTE — Telephone Encounter (Signed)
Patient called and said she was going to work and asked that you call her there at [phone].

## 2012-07-05 NOTE — Telephone Encounter (Signed)
Patient called and has started having loose stools and abd pain again. She says they are intermittent and she hasn't noted any blood. She wants to know what to do from this point and asks that you call her because she doesn't want to come in for the same thing again. She has been taking the dicylomine as prescribed and it helps some.

## 2012-07-05 NOTE — Telephone Encounter (Signed)
Tried to call work twice - no answer.  No answer at home either.  H/o IBS, PUD and diverticulosis. Compliant with dexilant.   I'd ensure she's off miralax completely as that can cause diarrhea.  i'd also recommend regular bentyl (dicyclomine) use QID.   Try clear liquid diet for next 2 days to give bowels some rest.  If persistent pain past a few days, come in on Monday for eval.

## 2012-07-06 NOTE — Telephone Encounter (Signed)
Patient notified. She will call Monday for appt if no better.

## 2012-07-09 ENCOUNTER — Encounter: Payer: Self-pay | Admitting: Family Medicine

## 2012-07-09 ENCOUNTER — Ambulatory Visit (INDEPENDENT_AMBULATORY_CARE_PROVIDER_SITE_OTHER): Payer: Medicare Other | Admitting: Family Medicine

## 2012-07-09 VITALS — BP 140/70 | HR 80 | Temp 97.9°F | Wt 157.0 lb

## 2012-07-09 DIAGNOSIS — R109 Unspecified abdominal pain: Secondary | ICD-10-CM | POA: Diagnosis not present

## 2012-07-09 LAB — LIPASE: Lipase: 15 U/L (ref 11.0–59.0)

## 2012-07-09 LAB — COMPREHENSIVE METABOLIC PANEL
BUN: 19 mg/dL (ref 6–23)
CO2: 29 mEq/L (ref 19–32)
Calcium: 8.8 mg/dL (ref 8.4–10.5)
Chloride: 102 mEq/L (ref 96–112)
Creatinine, Ser: 0.9 mg/dL (ref 0.4–1.2)
GFR: 65.21 mL/min (ref >60.00)
Glucose, Bld: 88 mg/dL (ref 70–99)
Total Bilirubin: 0.3 mg/dL (ref 0.3–1.2)

## 2012-07-09 MED ORDER — CIPROFLOXACIN HCL 500 MG PO TABS: 500.0000 mg | ORAL_TABLET | Freq: Two times a day (BID) | ORAL | Status: DC

## 2012-07-09 NOTE — Progress Notes (Signed)
  Subjective:    [name], female    DOB: Aug 26, 1927, 79 y.o.   MRN: [id number]  HPI CC: abd pain  H/o IBS, PUD and diverticulosis. Thinks she's taking dexilant.  Not currently taking miralax.  H/o chronic abd pain in past, previously responsive to cipro course (presumed diverticulitis).  Increased bloating and diarrhea over last week.  Formed but loose stool.  Describes constant pain over entire abdomen.  Resolves with sleep, returns when awakens.  Increased fatigue.  Passing gas well.  Lower back aches.  Points to lower abdomen as site of pain.  Denies fevers/chills, nausea/vomiting, blood in stool.  Has changed diet per our request - liquid diet.    7-up helps. No recent abx use.  Taking bentyl QID with meals.  That has seemed to help abd pain.  Wt Readings from Last 3 Encounters:  07/09/12 157 lb (71.215 kg)  05/14/12 154 lb 4 oz (69.967 kg)  05/07/12 154 lb 4 oz (69.967 kg)   Past Medical History  Diagnosis Date  . Hypertension   . Coronary artery disease     CABG 1998  . Anxiety   . Diastolic dysfunction   . Hyperlipidemia   . PUD (peptic ulcer disease)   . Anemia   . Depression   . Diabetes mellitus     type 2, followed by endo.  . Diverticulosis   . Hemorrhoids   . History of heart attack   . Depression   . Irritable bowel syndrome   . Chronic back pain   . Varicose veins   . Cystocele   . Hx of echocardiogram     a. Echo 10/13: mild LVH, EF 55-60%, Gr 1 diast dysfn, PASP 32    Review of Systems Per HPI    Objective:   Physical Exam  Nursing note and vitals reviewed. Constitutional: She appears well-developed and well-nourished. No distress.  Cardiovascular: Normal rate, regular rhythm, normal heart sounds and intact distal pulses.   No murmur heard. Pulmonary/Chest: Effort normal and breath sounds normal. No respiratory distress. She has no wheezes. She has no rales.  Abdominal: Soft. Bowel sounds are normal. She exhibits distension  (mild). She exhibits no mass. There is no hepatosplenomegaly. There is tenderness (diffuse) in the left lower quadrant. There is no rigidity, no rebound, no guarding, no CVA tenderness and negative Murphy's sign.  Passes flatus well  Musculoskeletal: She exhibits no edema.  Psychiatric:  Anxious, baseline for her       Assessment & Plan:

## 2012-07-09 NOTE — Assessment & Plan Note (Addendum)
I'm concerned about recurrent diverticulitis given her h/o severe diverticulosis. Will treat with cipro 500mg  bid course as well as check blood work today (consider addition of flagyl). If worsening, rec seek urgent care. If not improving as expected, will obtain CT scan for further eval. Pt agrees with plan.

## 2012-07-09 NOTE — Patient Instructions (Addendum)
This may be recurrent diverticulitis.  Treat with cipro twice daily for 5 days. Blood work today. If not better with this, let me know for CT scan of abdomen. Good to see you today, call us with questions. Watch for fever or worsening abdominal pain - if that happens, please seek urgent care.

## 2012-07-11 ENCOUNTER — Encounter: Payer: Self-pay | Admitting: Family Medicine

## 2012-07-11 LAB — CBC WITH DIFFERENTIAL/PLATELET
Eosinophils Relative: 4.4 % (ref 0.0–5.0)
MCV: 79.8 fl (ref 78.0–100.0)
Monocytes Absolute: 0.7 10*3/uL (ref 0.1–1.0)
Neutrophils Relative %: 61.4 % (ref 43.0–77.0)
Platelets: 317 10*3/uL (ref 150.0–400.0)
WBC: 8.6 10*3/uL (ref 4.5–10.5)

## 2012-07-12 ENCOUNTER — Encounter: Payer: Self-pay | Admitting: *Deleted

## 2012-07-13 ENCOUNTER — Other Ambulatory Visit: Payer: Self-pay | Admitting: Family Medicine

## 2012-07-13 NOTE — Telephone Encounter (Signed)
Plz phone in

## 2012-07-13 NOTE — Telephone Encounter (Signed)
Rx phoned to pharmacy.  

## 2012-07-13 NOTE — Telephone Encounter (Signed)
Ok to refill 

## 2012-07-16 ENCOUNTER — Encounter: Payer: Self-pay | Admitting: Family Medicine

## 2012-07-16 ENCOUNTER — Ambulatory Visit (INDEPENDENT_AMBULATORY_CARE_PROVIDER_SITE_OTHER): Payer: Medicare Other | Admitting: Family Medicine

## 2012-07-16 VITALS — BP 128/64 | HR 84 | Temp 98.1°F | Wt 156.5 lb

## 2012-07-16 DIAGNOSIS — R109 Unspecified abdominal pain: Secondary | ICD-10-CM

## 2012-07-16 DIAGNOSIS — R609 Edema, unspecified: Secondary | ICD-10-CM

## 2012-07-16 DIAGNOSIS — R600 Localized edema: Secondary | ICD-10-CM

## 2012-07-16 MED ORDER — DICYCLOMINE HCL 20 MG PO TABS: ORAL_TABLET | ORAL | Status: DC

## 2012-07-16 NOTE — Assessment & Plan Note (Signed)
Anticipate due to CVI, discussed this. Recommended elevation of legs, continued use of compression stockings as tight as tolerated, and avoiding sodium. Pt agrees with plan.

## 2012-07-16 NOTE — Assessment & Plan Note (Signed)
abd pain actually much improved, now endorses gas and distension, but stable BMs. Recommended avoid gas producing foods as well as recommended trial of activia. Continue dicyclomine as seems to be helping. Pt agrees with plan. rtc 1 mo for f/u.

## 2012-07-16 NOTE — Patient Instructions (Signed)
Make sure you're taking dexilant daily. Exclude gas producing foods (beans, onions, celery, carrots, raisins, bananas, apricots, prunes, brussel sprouts, wheat germ, pretzels) May try [place] daily for bowel health. I think we are overall doing better.  Watch for worsening pain. For legs - I think this is from venous insufficiency - keep legs up as much as able, continue compression as much as tolerated, avoid salt, and drink lots of water

## 2012-07-16 NOTE — Progress Notes (Signed)
  Subjective:    [name], female    DOB: 1928-01-15, 79 y.o.   MRN: [id number]  HPI CC: continued abd pain  See prior note for details.  H/o IBS, PUD and diverticulosis. Thinks she's taking dexilant. Not currently taking miralax. H/o chronic abd pain in past, earlier this year responsive to cipro course (presumed diverticulitis).  Seen here on 07/09/2012 with concern for recurrent diverticulitis flare, treated with 7d course of cipro 500mg  bid.  Blood work returned without evidence of leukocytosis or transaminitis, Cr normal.  Anemia with Hgb of 10.3 and mild hypoalbuminemia present.  Finished 7d course of cipro. Also taking dicyclomine QID with meals. Continued swelling sensation but pain actually better.  Increased flatus noted as well.  Normal BMs. On actos longterm.  Intolerant of metformin. On gasX in past, didn't like it.  Also noticed swelling of right > left leg for last 3 days.  Present even when awoke on Saturday morning.  Using warm compresses.  Also using lightweight compression hose, can hardly get them on.  Unable to get heavy hose on.  Lab Results  Component Value Date   CREATININE 0.9 07/09/2012    Wt Readings from Last 3 Encounters:  07/16/12 156 lb 8 oz (70.988 kg)  07/09/12 157 lb (71.215 kg)  05/14/12 154 lb 4 oz (69.967 kg)    Past Medical History  Diagnosis Date  . Hypertension   . Coronary artery disease     CABG 1998  . Anxiety   . Diastolic dysfunction   . Hyperlipidemia   . PUD (peptic ulcer disease)   . Microcytic anemia   . Depression   . Diabetes mellitus     type 2, followed by endo.  . Diverticulosis   . Hemorrhoids   . History of heart attack   . Depression   . Irritable bowel syndrome   . Chronic back pain   . Varicose veins   . Cystocele   . Hx of echocardiogram     a. Echo 10/13: mild LVH, EF 55-60%, Gr 1 diast dysfn, PASP 32    Review of Systems Per HPI    Objective:   Physical Exam  Nursing note and vitals  reviewed. Constitutional: She appears well-developed and well-nourished. No distress.  HENT:  Mouth/Throat: Oropharynx is clear and moist. No oropharyngeal exudate.  Abdominal: Soft. Bowel sounds are normal. She exhibits no distension and no mass. There is no hepatosplenomegaly. There is tenderness (mild) in the right upper quadrant and epigastric area. There is no rigidity, no rebound, no guarding and no CVA tenderness.  obese  Musculoskeletal: She exhibits edema (1+ pedal edema).  Varicose veins bilaterally  Skin: Skin is warm and dry. No rash noted.  Psychiatric: She has a normal mood and affect.      Assessment & Plan:

## 2012-07-17 ENCOUNTER — Telehealth: Payer: Self-pay | Admitting: *Deleted

## 2012-07-17 NOTE — Telephone Encounter (Signed)
Patient called and said she has had 5 BM's since 8:30 this AM and wants to know what she should do now. She denies fever, vomiting, nausea, or blood in stool. She had ribs and apples at supper last night. She said she is weak and is sick. She said when she was taking the cipro she didn't have the diarrhea, but she finished it Sunday and now it has returned.

## 2012-07-17 NOTE — Telephone Encounter (Signed)
Persistent loose stools.  I don't think she needs further antibiotics.  Blood work unrevealing I'm not sure what's causing this.  If pt desires, may have her see Dr. Ardis Hughs for further evaluation.

## 2012-07-17 NOTE — Telephone Encounter (Signed)
Patient left message that she had to go to work and to call her there at [phone].

## 2012-07-18 NOTE — Telephone Encounter (Signed)
Spoke w patient.  Last night and today feeling better. offered CT scan, pt desires to monitor sxs today.  If not improving, will call us to schedule CT scan.  If that is unrevealing, will recommend return to Dr. Allyn Kenner her GI doctor. Ok to take 1/2 immodium prn diarrhea.

## 2012-07-30 ENCOUNTER — Ambulatory Visit (INDEPENDENT_AMBULATORY_CARE_PROVIDER_SITE_OTHER)
Admission: RE | Admit: 2012-07-30 | Discharge: 2012-07-30 | Disposition: A | Discharge status: Home / Self Care | Payer: Medicare Other | Source: Ambulatory Visit | Attending: Family Medicine | Admitting: Family Medicine

## 2012-07-30 ENCOUNTER — Ambulatory Visit (INDEPENDENT_AMBULATORY_CARE_PROVIDER_SITE_OTHER): Payer: Medicare Other | Admitting: Family Medicine

## 2012-07-30 ENCOUNTER — Encounter: Payer: Self-pay | Admitting: Family Medicine

## 2012-07-30 VITALS — BP 130/64 | HR 90 | Temp 98.1°F | Ht <= 58 in | Wt 154.0 lb

## 2012-07-30 DIAGNOSIS — R109 Unspecified abdominal pain: Secondary | ICD-10-CM

## 2012-07-30 DIAGNOSIS — R52 Pain, unspecified: Secondary | ICD-10-CM | POA: Diagnosis not present

## 2012-07-30 DIAGNOSIS — M545 Low back pain: Secondary | ICD-10-CM | POA: Diagnosis not present

## 2012-07-30 DIAGNOSIS — K449 Diaphragmatic hernia without obstruction or gangrene: Secondary | ICD-10-CM | POA: Diagnosis not present

## 2012-07-30 MED ORDER — IOHEXOL 300 MG/ML  SOLN
100.0000 mL | Freq: Once | INTRAMUSCULAR | Status: AC | PRN
  Administered 2012-07-30: 100 mL via INTRAVENOUS

## 2012-07-30 NOTE — Patient Instructions (Addendum)
REFERRAL: GO THE THE FRONT ROOM AT THE ENTRANCE OF OUR CLINIC, NEAR CHECK IN. ASK FOR [name] SHE WILL HELP YOU SET UP YOUR REFERRAL. DATE: TIME:  

## 2012-07-30 NOTE — Progress Notes (Signed)
Therapist, music at [facility] [place] [address] Phone: [phone] Fax: [phone]  Date:  07/30/2012   Name:  [name]   DOB:  Mar 06, 1928   MRN:  [id number] Gender: female Age: 79 y.o.  Primary Physician:  Ria Bush, MD  Evaluating MD: Owens Loffler, MD   Chief Complaint: Abdominal Pain and Back Pain   History of Present Illness:  [name] is a 80 y.o. pleasant patient who presents with the following:  Multiple problems: Severe stomach pains, and back. Back and stomach.   When gets up in the morning, stomach pain and back pain for the last week. Stayed in bed the last week.   6 months of abdominal pain that has been worsening the last week, and escalating the last 2 days. Nausea, occ vomitting, minimal PO in the last couple of days. No diarrhea. Abdominal distension, worsening. Diffuse abd pain.  Lumbar back pain, chronic.  Patient Active Problem List   Diagnosis Date Noted  . Abdominal  pain, other specified site 05/14/2012  . Diarrhea 04/03/2012  . Peptic ulcer disease   . Hot flashes 12/13/2011  . Falls 12/13/2011  . Postmenopausal 09/14/2011  . Lower back pain 09/14/2011  . Pedal edema 09/14/2011  . Ruptured varicose vein 08/16/2011  . Bleeding from varicose vein 07/08/2011  . Abdominal pain, epigastric 01/17/2011  . Skin rash 12/20/2010  . Microcytic anemia 09/06/2010  . Diabetes type 2, uncontrolled 09/06/2010  . Hyperlipidemia 09/06/2010  . Anxiety 03/15/2010  . Obesity 03/15/2010  . Hypertension 03/15/2010  . Diastolic dysfunction 0000000  . Coronary artery disease     Past Medical History  Diagnosis Date  . Hypertension   . Coronary artery disease     CABG 1998  . Anxiety   . Diastolic dysfunction   . Hyperlipidemia   . PUD (peptic ulcer disease)   . Microcytic anemia   . Depression   . Diabetes mellitus     type 2, followed by endo.  . Diverticulosis   . Hemorrhoids   . History of  heart attack   . Depression   . Irritable bowel syndrome   . Chronic back pain   . Varicose veins   . Cystocele   . Hx of echocardiogram     a. Echo 10/13: mild LVH, EF 55-60%, Gr 1 diast dysfn, PASP 32    Past Surgical History  Procedure Laterality Date  . Tympanoplasty  1960s    R side  . Coronary artery bypass graft  1998  . Cataract surgery    . Cholecystectomy    . Appendectomy      per pt report  . Colonoscopy  02/2010    extensive diverticulosis throughout, small internal hemorrhoids, rec rpt 5 yrs (Dr. Allyn Kenner)  . Hand surgery    . Lexiscan myoview  03/2011    Small basal inferolateral and anterolateral reversible perfusion defect suggests ischemia.  EF was normal.   old infarct, no new ischemia  . Toe surgery    . Abdominal hysterectomy  1975    TAH,BSO  . Vault prolapse a & p repair  2009    Dr [facility] hospital  . Dexa scan  09/2011    femur -0.8, forearm 0.6 => normal    History   Social History  . Marital Status: Single    Spouse Name: N/A    Number of Children: N/A  . Years of Education: N/A   Occupational History  .  Waitress     Social History Main Topics  . Smoking status: Never Smoker   . Smokeless tobacco: Never Used  . Alcohol Use: No  . Drug Use: No  . Sexually Active: No   Other Topics Concern  . 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   Independent for all ADLs, lives with a boyfriend.      Family History  Problem Relation Age of Onset  . Hypertension Mother   . Stroke Mother   . Diabetes Father   . Diabetes Sister   . Diabetes Brother   . Colon cancer Neg Hx     Allergies  Allergen Reactions  . Amoxicillin-Pot Clavulanate Diarrhea  . Metformin And Related Diarrhea    Medication list has been reviewed and updated.  Outpatient Prescriptions Prior to Visit  Medication Sig Dispense Refill  . acetaminophen (TYLENOL) 500 MG tablet Take 500 mg by mouth every 8 (eight) hours as  needed.      . ALPRAZolam (XANAX) 1 MG tablet Take 0.5 mg by mouth 4 (four) times daily. For anxiety      . Ascorbic Acid (VITAMIN C PO) Take 1 tablet by mouth daily.       Marland Kitchen aspirin 81 MG tablet Take 81 mg by mouth daily.       Marland Kitchen CALCIUM PO Take 1 tablet by mouth daily.      . clotrimazole (LOTRIMIN) 1 % cream APPLY TOPICALLY 2 (TWO) TIMES DAILY.  60 g  0  . Cyanocobalamin (VITAMIN B 12 PO) Take 1 tablet by mouth daily.      Marland Kitchen DEXILANT 30 MG capsule TAKE 1 CAPSULE BY MOUTH DAILY  30 capsule  11  . dicyclomine (BENTYL) 20 MG tablet TAKE 1 TABLET (20 MG TOTAL) BY MOUTH 3 (THREE) TIMES DAILY AS NEEDED (ABDOMINAL PAIN/SPASM).  60 tablet  6  . estradiol (ESTRACE) 0.5 MG tablet TAKE 1 TABLET BY MOUTH DAILY  30 tablet  3  . glucosamine-chondroitin 500-400 MG tablet Take 1 tablet by mouth daily.        Marland Kitchen HYDROcodone-acetaminophen (NORCO/VICODIN) 5-325 MG per tablet TAKE 1/2 TABLET BY MOUTH EVERY 6 HOURS AS NEEDED  60 tablet  0  . Multiple Vitamin (MULTIVITAMIN) tablet Take 1 tablet by mouth daily.        . nitroGLYCERIN (NITROSTAT) 0.4 MG SL tablet Place 0.4 mg under the tongue every 5 (five) minutes as needed. For chest pain May take up to 3 doses, if pain persists call 911.      . nystatin-triamcinolone ointment (MYCOLOG) APPLY TO AFFECTED AREA TWICE A DAY  30 g  2  . pioglitazone (ACTOS) 45 MG tablet Take 45 mg by mouth daily.        . polyethylene glycol (MIRALAX / GLYCOLAX) packet Take 8.5 g by mouth daily. Hold for diarrhea      . pravastatin (PRAVACHOL) 20 MG tablet TAKE 1 TABLET BY MOUTH DAILY.  30 tablet  3  . ramipril (ALTACE) 10 MG capsule Take 10 mg by mouth daily.      . traZODone (DESYREL) 150 MG tablet Take 150 mg by mouth at bedtime.        . triamterene-hydrochlorothiazide (DYAZIDE) 37.5-25 MG per capsule 1/2 tab by mouth daily.      Marland Kitchen VITAMIN E PO Take 1 tablet by mouth daily.        No facility-administered medications prior to visit.  Review of Systems:   GEN: No acute  illnesses, no fevers, chills. GI: as above Pulm: No SOB Interactive and getting along well at home.  Otherwise, ROS is as per the HPI.   Physical Examination: BP 130/64  Pulse 90  Temp(Src) 98.1 F (36.7 C) (Oral)  Ht 4\' 10"  (1.473 m)  Wt 154 lb (69.854 kg)  BMI 32.19 kg/m2  SpO2 96%  Ideal Body Weight: Weight in (lb) to have BMI = 25: 119.4  GEN: WDWN, NAD, Non-toxic, A & O x 3 HEENT: Atraumatic, Normocephalic. Neck supple. No masses, No LAD. Ears and Nose: No external deformity. CV: RRR, No M/G/R. No JVD. No thrill. No extra heart sounds. PULM: CTA B, no wheezes, crackles, rhonchi. No retractions. No resp. distress. No accessory muscle use. ABD: S, moderately significant distension with diffuse tenderness, +BS. No rebound. No HSM. EXTR: No c/c/e NEURO Normal gait.  PSYCH: Normally interactive. Conversant. Not depressed or anxious appearing.  Calm demeanor.    Assessment and Plan:  Acute abdominal pain - Plan: CT Abdomen Pelvis W Contrast  Lower back pain  Obtain CT of abd and pelvis with contrast to evaluate for sbo, neoplasm, bowel perf or other acute pathology.  Ddd. Await ct first, which will alter dispo  Orders Today:  Orders Placed This Encounter  Procedures  . CT Abdomen Pelvis W Contrast    KJ/MARION [phone]/PT IS DIAB/BUN 19 / CREATININE 0.9/MEDICARE/BCBS INS NOT REQ/PT TO DRINK 2 BTLS CM    Standing Status: Future     Number of Occurrences:      Standing Expiration Date: 10/30/2013    Order Specific Question:  Preferred imaging location?    Answer:  [place]    Order Specific Question:  Reason for exam:    Answer:  abd pain and abd distension    Updated Medication List: (Includes new medications, updates to list, dose adjustments) No orders of the defined types were placed in this encounter.    Medications Discontinued: There are no discontinued medications.    Signed, Maud Deed. Fumi Guadron, MD 07/30/2012 11:44 AM

## 2012-07-31 ENCOUNTER — Telehealth: Payer: Self-pay

## 2012-07-31 MED ORDER — CIPROFLOXACIN HCL 750 MG PO TABS: 750.0000 mg | ORAL_TABLET | Freq: Two times a day (BID) | ORAL | Status: DC

## 2012-07-31 MED ORDER — METRONIDAZOLE 500 MG PO TABS: 500.0000 mg | ORAL_TABLET | Freq: Three times a day (TID) | ORAL | Status: DC

## 2012-07-31 NOTE — Telephone Encounter (Signed)
Pt said abd and back pain is same as when seen 07/30/12; now pain level 7-8. Pt having a lot of gas last night and this AM. No fever, nausea or vomiting. Last night had diarrhea x 1.This morning pt has taken Bentyl and Cipro 500 mg (pt had one left from previous prescription). Pt request call back. [facility].

## 2012-07-31 NOTE — Telephone Encounter (Signed)
Discussed CT findings with the patient, which are essentially benign. With a very strong history of diverticulitis and worsening, my best idea of etiology is early diverticulitis not seen on CT, so we will try flagyl and cipro. If symptoms worsen, then I will attempt to get her into GI emergently.

## 2012-08-13 ENCOUNTER — Encounter: Payer: Self-pay | Admitting: Family Medicine

## 2012-08-13 ENCOUNTER — Ambulatory Visit (INDEPENDENT_AMBULATORY_CARE_PROVIDER_SITE_OTHER): Payer: Medicare Other | Admitting: Family Medicine

## 2012-08-13 VITALS — BP 124/62 | HR 88 | Temp 98.1°F | Wt 155.8 lb

## 2012-08-13 DIAGNOSIS — R109 Unspecified abdominal pain: Secondary | ICD-10-CM | POA: Diagnosis not present

## 2012-08-13 NOTE — Assessment & Plan Note (Signed)
Persistent lower abd pain associated with loose stools. CT scan overall reassuring, has shown persistent gastric wall thickening thought due to underdistension. Has completed flagyl course.  Advised stop cipro course (has taken 10+ days, but thinks was taking 250mg  bid instead of prescribed 750mg  bid which was somewhat high dose) Anticipate IBS flare - will recommend she start align daily, as well as continue bentyl. If not improved with align (samples provided), recommend she f/u with GI for further evaluation. Pt agrees with plan.

## 2012-08-13 NOTE — Progress Notes (Signed)
  Subjective:    [name], female    DOB: Jan 23, 1928, 79 y.o.   MRN: [id number]  HPI CC: abd pain  H/o IBS, PUD and diverticulosis. Thinks she's taking dexilant. Not currently taking miralax. H/o chronic abd pain in past, earlier this year responsive to cipro course (presumed diverticulitis). Seen here on 07/09/2012 with concern for recurrent diverticulitis flare, treated with 7d course of cipro 500mg  bid. Blood work returned without evidence of leukocytosis or transaminitis, Cr normal. Anemia with Hgb of 10.3 and mild hypoalbuminemia present.  Last visit we started [place] and I recommended avoidance of gas producing foods.  She never started [place]. Also taking dicyclomine QID with meals.  On actos longterm. Intolerant of metformin.  On gasX in past, didn't like it.  Since last seen by myself, saw my partner Dr. Lorelei Pont with continued abdominal pain, CT abd/pelvis was obtained: IMPRESSION:  1. No acute process in the abdomen or pelvis.  2. Small hiatal hernia. Esophageal air fluid level suggests dysmotility or gastroesophageal reflux.  3. Pelvic floor laxity, with a cystocele.  4. Redemonstration of apparent gastric wall thickening, favored to be due to underdistension.  Treated for presumed early diverticulitis with cipro/flagyl course.  Thinks took cipro incorrectly.  Completed 10 d course of flagyl.  Has stopped glucosamine - thinks back pain has worsened from this.  Has restarted glucosamine today. No current nausea, vomiting, fevers/chills.   Intermittent diarrhea - has stooled 5x today.  Some stool urgency, not to point of having accident.    Past Medical History  Diagnosis Date  . Hypertension   . Coronary artery disease     CABG 1998  . Anxiety   . Diastolic dysfunction   . Hyperlipidemia   . PUD (peptic ulcer disease)   . Microcytic anemia   . Depression   . Diabetes mellitus     type 2, followed by endo.  . Diverticulosis   . Hemorrhoids   .  History of heart attack   . Depression   . Irritable bowel syndrome   . Chronic back pain   . Varicose veins   . Cystocele   . Hx of echocardiogram     a. Echo 10/13: mild LVH, EF 55-60%, Gr 1 diast dysfn, PASP 32     Review of Systems     Objective:   Physical Exam  Nursing note and vitals reviewed. Constitutional: She appears well-developed and well-nourished. No distress.  HENT:  Mouth/Throat: Oropharynx is clear and moist. No oropharyngeal exudate.  Cardiovascular: Normal rate, regular rhythm and intact distal pulses.   Murmur (3/6 SEM best at LUSB) heard. Pulmonary/Chest: Effort normal and breath sounds normal. No respiratory distress. She has no wheezes. She has no rales.  Abdominal: Soft. Normal appearance. She exhibits distension (mild). She exhibits no mass. Bowel sounds are increased. There is no hepatosplenomegaly. There is tenderness (mild-moderate to LUQ, LLQ, suprapubically). There is no rigidity, no rebound, no guarding, no CVA tenderness and negative Murphy's sign. No hernia.  Mildly increased bowel sounds  Musculoskeletal: She exhibits no edema.  Skin: Skin is warm and dry. No rash noted.      Assessment & Plan:

## 2012-08-13 NOTE — Patient Instructions (Signed)
Try taking probiotic align daily for irritable bowel symptoms. If this doesn't help, call Dr. Allyn Kenner to schedule follow up with him. Good to see you today, call us with questions.

## 2012-08-20 ENCOUNTER — Ambulatory Visit: Payer: Medicare Other | Admitting: Family Medicine

## 2012-08-23 ENCOUNTER — Other Ambulatory Visit: Payer: Self-pay | Admitting: Family Medicine

## 2012-08-23 ENCOUNTER — Telehealth: Payer: Self-pay | Admitting: Family Medicine

## 2012-08-23 MED ORDER — ALIGN 4 MG PO CAPS: 1.0000 | ORAL_CAPSULE | Freq: Every day | ORAL | Status: DC

## 2012-08-23 NOTE — Telephone Encounter (Signed)
Patient received samples of Align.  Patient will run out of the medication on Sunday.  Patient scheduled an appointment for a follow up on Tuesday.  Patient wants to know if you want for her to wait until Tuesday to receive more medication or can it be called in before the appointment.  [facility].  Patient said her stomach is doing better since she started the medication, but she still has tender spots.

## 2012-08-23 NOTE — Addendum Note (Signed)
Addended by: Ria Bush on: 08/23/2012 02:03 PM   Modules accepted: Orders

## 2012-08-23 NOTE — Telephone Encounter (Signed)
Ok to refill 

## 2012-08-23 NOTE — Telephone Encounter (Signed)
She can come in for more samples - and I've sent into pharmacy, but this is an over the counter medication she can buy at local pharmacy (or possibly even grocery store).

## 2012-08-23 NOTE — Telephone Encounter (Signed)
Patient notified and samples placed up front for pick up.

## 2012-08-24 ENCOUNTER — Encounter: Payer: Self-pay | Admitting: Radiology

## 2012-08-24 ENCOUNTER — Other Ambulatory Visit: Payer: Self-pay | Admitting: Gynecology

## 2012-08-24 NOTE — Telephone Encounter (Signed)
Patient has her yearly exam scheduled with Dr. Loetta Rough for 10/08/12.

## 2012-08-24 NOTE — Telephone Encounter (Signed)
plz phone in. 

## 2012-08-28 ENCOUNTER — Encounter: Payer: Self-pay | Admitting: Family Medicine

## 2012-08-28 ENCOUNTER — Ambulatory Visit (INDEPENDENT_AMBULATORY_CARE_PROVIDER_SITE_OTHER): Payer: Medicare Other | Admitting: Family Medicine

## 2012-08-28 VITALS — BP 128/74 | HR 80 | Temp 98.1°F | Wt 155.2 lb

## 2012-08-28 DIAGNOSIS — F411 Generalized anxiety disorder: Secondary | ICD-10-CM | POA: Diagnosis not present

## 2012-08-28 DIAGNOSIS — R21 Rash and other nonspecific skin eruption: Secondary | ICD-10-CM

## 2012-08-28 DIAGNOSIS — M545 Low back pain: Secondary | ICD-10-CM | POA: Diagnosis not present

## 2012-08-28 DIAGNOSIS — R109 Unspecified abdominal pain: Secondary | ICD-10-CM | POA: Diagnosis not present

## 2012-08-28 DIAGNOSIS — F419 Anxiety disorder, unspecified: Secondary | ICD-10-CM

## 2012-08-28 DIAGNOSIS — G8929 Other chronic pain: Secondary | ICD-10-CM

## 2012-08-28 DIAGNOSIS — M7989 Other specified soft tissue disorders: Secondary | ICD-10-CM | POA: Diagnosis not present

## 2012-08-28 MED ORDER — GABAPENTIN 100 MG PO CAPS: 100.0000 mg | ORAL_CAPSULE | Freq: Two times a day (BID) | ORAL | Status: DC

## 2012-08-28 NOTE — Assessment & Plan Note (Signed)
Excessive worrying evident today which could exacerbate IBS - discussed I don't think anxiety well controlled and recommended trial of SSRI/SNRI - pt desires to discuss with her psychiatrist first

## 2012-08-28 NOTE — Assessment & Plan Note (Signed)
Significant R pedal edema and pain with current HRT use (estrogen) and overall sedentary lifestyle.  I do think we will need doppler to r/o DVT as cause of this unilateral swelling and pain. Will try and obtain today.
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
## 2012-08-28 NOTE — Telephone Encounter (Signed)
Rx called in as directed.   

## 2012-08-28 NOTE — Assessment & Plan Note (Signed)
Anticipate 2/2 IBS - discussed this. Recommended continued PPI (dexilant) daily, continued bentyl with meals, and align - as this seems to be helping some.   No evidence of diverticulitis today.

## 2012-08-28 NOTE — Assessment & Plan Note (Addendum)
L spine showing multilevel disc space flattening - persistent lower back pain for >1 year, unresponsive to hydrocodone and extra strength tylenol.  Avoid NSAIDs 2/2 h/o PUD. H/o normal bone density (dexa 09/2011) Discussed again further eval with MRI and consideration of ESI - pt willing today so will order lumbar MRI to assess targets for steroid injection. Pt unable to perform job functions 2/2 severe back pain. Describes neuropathic pain down left leg (?from DM or from lumbar spine) - so we will start trial of gabapentin at 100mg  nightly for 1 wk then increase to 100mg  bid.

## 2012-08-28 NOTE — Assessment & Plan Note (Signed)
Lower extremity rash - erythematous patches, ?psoriasis. hesitant to biopsy as may not heal well  Consider steroid cream - for now will work up LE swelling with doppler.

## 2012-08-28 NOTE — Patient Instructions (Addendum)
You need a vacation! I want to obtain ultrasound of right leg. I want to obtain MRI of lumbar spine and will likely afterwards refer you to spine doctors for discussion of steroid shots. You have irritable bowel syndrome - continue align daily and bentyl with meals.  Look at information below. I don't think anxiety is well controlled - I want you to discuss this with Dr. Lattie Haw. Hydrocodone (pain medicine) is at your pharmacy. Start gabapentin 100mg  at night, then increase to 100mg  twice daily after 1 week.   Irritable Bowel Syndrome Irritable Bowel Syndrome (IBS) is caused by a disturbance of normal bowel function. Other terms used are spastic colon, mucous colitis, and irritable colon. It does not require surgery, nor does it lead to cancer. There is no cure for IBS. But with proper diet, stress reduction, and medication, you will find that your problems (symptoms) will gradually disappear or improve. IBS is a common digestive disorder. It usually appears in late adolescence or early adulthood. Women develop it twice as often as men. CAUSES  After food has been digested and absorbed in the small intestine, waste material is moved into the colon (large intestine). In the colon, water and salts are absorbed from the undigested products coming from the small intestine. The remaining residue, or fecal material, is held for elimination. Under normal circumstances, gentle, rhythmic contractions on the bowel walls push the fecal material along the colon towards the rectum. In IBS, however, these contractions are irregular and poorly coordinated. The fecal material is either retained too long, resulting in constipation, or expelled too soon, producing diarrhea. SYMPTOMS  The most common symptom of IBS is pain. It is typically in the lower left side of the belly (abdomen). But it may occur anywhere in the abdomen. It can be felt as heartburn, backache, or even as a dull pain in the arms or shoulders. The pain  comes from excessive bowel-muscle spasms and from the buildup of gas and fecal material in the colon. This pain:  Can range from sharp belly (abdominal) cramps to a dull, continuous ache.  Usually worsens soon after eating.  Is typically relieved by having a bowel movement or passing gas. Abdominal pain is usually accompanied by constipation. But it may also produce diarrhea. The diarrhea typically occurs right after a meal or upon arising in the morning. The stools are typically soft and watery. They are often flecked with secretions (mucus). Other symptoms of IBS include:  Bloating.  Loss of appetite.  Heartburn.  Feeling sick to your stomach (nausea).  Belching  Vomiting  Gas. IBS may also cause a number of symptoms that are unrelated to the digestive system:  Fatigue.  Headaches.  Anxiety  Shortness of breath  Difficulty in concentrating.  Dizziness. These symptoms tend to come and go. DIAGNOSIS  The symptoms of IBS closely mimic the symptoms of other, more serious digestive disorders. So your caregiver may wish to perform a variety of additional tests to exclude these disorders. He/she wants to be certain of learning what is wrong (diagnosis). The nature and purpose of each test will be explained to you. TREATMENT A number of medications are available to help correct bowel function and/or relieve bowel spasms and abdominal pain. Among the drugs available are:  Mild, non-irritating laxatives for severe constipation and to help restore normal bowel habits.  Specific anti-diarrheal medications to treat severe or prolonged diarrhea.  Anti-spasmodic agents to relieve intestinal cramps.  Your caregiver may also decide to treat you  with a mild tranquilizer or sedative during unusually stressful periods in your life. The important thing to remember is that if any drug is prescribed for you, make sure that you take it exactly as directed. Make sure that your caregiver  knows how well it worked for you. HOME CARE INSTRUCTIONS   Avoid foods that are high in fat or oils. Some examples [id number] cream, butter, frankfurters, sausage, and other fatty meats.  Avoid foods that have a laxative effect, such as fruit, fruit juice, and dairy products.  Cut out carbonated drinks, chewing gum, and "gassy" foods, such as beans and cabbage. This may help relieve bloating and belching.  Bran taken with plenty of liquids may help relieve constipation.  Keep track of what foods seem to trigger your symptoms.  Avoid emotionally charged situations or circumstances that produce anxiety.  Start or continue exercising.  Get plenty of rest and sleep. MAKE SURE YOU:   Understand these instructions.  Will watch your condition.  Will get help right away if you are not doing well or get worse. Document Released: 03/21/2005 Document Revised: 06/13/2011 Document Reviewed: 11/09/2007 [facility] Patient Information 2014 [place].

## 2012-08-29 ENCOUNTER — Other Ambulatory Visit: Payer: Self-pay | Admitting: Family Medicine

## 2012-08-30 ENCOUNTER — Ambulatory Visit: Payer: Self-pay | Admitting: Family Medicine

## 2012-08-30 ENCOUNTER — Telehealth: Payer: Self-pay

## 2012-08-30 ENCOUNTER — Encounter: Payer: Self-pay | Admitting: Family Medicine

## 2012-08-30 DIAGNOSIS — M7989 Other specified soft tissue disorders: Secondary | ICD-10-CM | POA: Diagnosis not present

## 2012-08-30 NOTE — Telephone Encounter (Signed)
Noted. I'm glad there's no DVT.

## 2012-08-30 NOTE — Telephone Encounter (Signed)
Malary with [facility] [place] called report on US doppler of rt lower extremity; negative for DVT; pt waiting. Dr Darnell Level said inform pt no blood clot and will wait on MRI results. Malary voiced understanding and will notify pt.

## 2012-09-04 ENCOUNTER — Other Ambulatory Visit: Payer: Medicare Other

## 2012-09-04 ENCOUNTER — Encounter: Payer: Self-pay | Admitting: Cardiovascular Disease

## 2012-09-04 ENCOUNTER — Ambulatory Visit: Payer: Medicare Other | Admitting: Cardiovascular Disease

## 2012-09-04 ENCOUNTER — Ambulatory Visit (INDEPENDENT_AMBULATORY_CARE_PROVIDER_SITE_OTHER): Payer: Medicare Other | Admitting: Cardiovascular Disease

## 2012-09-04 VITALS — BP 110/50 | HR 71 | Ht <= 58 in | Wt 158.8 lb

## 2012-09-04 DIAGNOSIS — I2510 Atherosclerotic heart disease of native coronary artery without angina pectoris: Secondary | ICD-10-CM | POA: Diagnosis not present

## 2012-09-04 NOTE — Patient Instructions (Addendum)
Your physician wants you to follow-up in: 6 month You will receive a reminder letter in the mail two months in advance. If you don't receive a letter, please call our office to schedule the follow-up appointment.   Your physician recommends that you continue on your current medications as directed. Please refer to the Current Medication list given to you today.

## 2012-09-04 NOTE — Progress Notes (Signed)
[place] Date of Birth  May 26, 1927 [facility] [address]    [place], [place]  [address] [phone]  Fax  [phone]  History of Present Illness:  [name] is an 79 yo female with a Hx of CAD, CABG ( November 09, 1994), diabetes mellitus, anxiety, HTN, hyperlipidemia.   She's been under a lot of stress recently. Her son died last year. She's under a lot of stress with the extra expenses involved such as his funeral. She presented with some CP in December.  A stress myoview revealed lateral ischemia.  We offered cardiac cath but she did not want to have the cath. She presents today without any particular complaints. She has not had any episodes of chest pain or shortness of breath since I saw her a month ago. She does have occasional episodes of tachycardia. She also notes that she has some anxiety. She was last seen in January after having an abnormal stress test that revealed anterior apical ischemia. She refused cardiac catheterization at that time. She has not had any recent episodes of chest pain.  She does not want to have a cardiac catheterization since she's feeling so well.  She has had some chest burning that is nearly constant.  I suggest several times that she should consider getting the cath done and she changed to subject.  September 04, 2012:  [name] is doing well from a cardiac standpoint.  She is still working - despite having lots of back pain.  No CP .  She had some CP several years ago and had a mildly abnormal myoview.  She did not want to have a cardiac cath at that time and has not had any significant epidoses of CP since that time.  She continues to be under lots of stress  She complains of back pain, stomach pain, burning in her legs.    Current Outpatient Prescriptions on File Prior to Visit  Medication Sig Dispense Refill  . acetaminophen (TYLENOL) 500 MG tablet Take 500 mg by mouth every 8 (eight) hours as needed.      . ALPRAZolam  (XANAX) 1 MG tablet Take 0.5 mg by mouth 4 (four) times daily. For anxiety      . Ascorbic Acid (VITAMIN C PO) Take 1 tablet by mouth daily.       Marland Kitchen aspirin 81 MG tablet Take 81 mg by mouth daily.       Marland Kitchen CALCIUM PO Take 1 tablet by mouth daily.      . clotrimazole (LOTRIMIN) 1 % cream APPLY TOPICALLY 2 (TWO) TIMES DAILY.  60 g  0  . Cyanocobalamin (VITAMIN B 12 PO) Take 1 tablet by mouth daily.      Marland Kitchen DEXILANT 30 MG capsule TAKE 1 CAPSULE BY MOUTH DAILY  30 capsule  11  . dicyclomine (BENTYL) 20 MG tablet TAKE 1 TABLET (20 MG TOTAL) BY MOUTH 3 (THREE) TIMES DAILY AS NEEDED (ABDOMINAL PAIN/SPASM).  60 tablet  6  . estradiol (ESTRACE) 0.5 MG tablet TAKE 1 TABLET BY MOUTH DAILY  30 tablet  3  . gabapentin (NEURONTIN) 100 MG capsule Take 1 capsule (100 mg total) by mouth 2 (two) times daily.  60 capsule  3  . glucosamine-chondroitin 500-400 MG tablet Take 1 tablet by mouth daily.        Marland Kitchen HYDROcodone-acetaminophen (NORCO/VICODIN) 5-325 MG per tablet TAKE 1/2 TABLET BY MOUTH EVERY 6 HOURS AS NEEDED  60 tablet  0  . Multiple  Vitamin (MULTIVITAMIN) tablet Take 1 tablet by mouth daily.        . nitroGLYCERIN (NITROSTAT) 0.4 MG SL tablet Place 0.4 mg under the tongue every 5 (five) minutes as needed. For chest pain May take up to 3 doses, if pain persists call 911.      . nystatin-triamcinolone ointment (MYCOLOG) APPLY TO AFFECTED AREA TWICE A DAY  30 g  2  . pioglitazone (ACTOS) 45 MG tablet Take 45 mg by mouth daily.        . pravastatin (PRAVACHOL) 20 MG tablet TAKE 1 TABLET BY MOUTH DAILY.  30 tablet  3  . Probiotic Product (ALIGN) 4 MG CAPS Take 1 capsule by mouth daily.  90 capsule  3  . ramipril (ALTACE) 10 MG capsule Take 10 mg by mouth daily.      . traZODone (DESYREL) 150 MG tablet Take 150 mg by mouth at bedtime.        . triamterene-hydrochlorothiazide (DYAZIDE) 37.5-25 MG per capsule Take 1 capsule by mouth daily.       Marland Kitchen VITAMIN E PO Take 1 tablet by mouth daily.        No current  facility-administered medications on file prior to visit.    Allergies  Allergen Reactions  . Amoxicillin-Pot Clavulanate Diarrhea  . Metformin And Related Diarrhea    Past Medical History  Diagnosis Date  . Hypertension   . Coronary artery disease     CABG 1998  . Anxiety   . Diastolic dysfunction   . Hyperlipidemia   . PUD (peptic ulcer disease)   . Microcytic anemia   . Depression   . Diabetes mellitus     type 2, followed by endo.  . Diverticulosis   . Hemorrhoids   . History of heart attack   . Depression   . Irritable bowel syndrome   . Chronic back pain 03/2012    lumbar DDD with herniation  . Varicose veins   . Cystocele   . Hx of echocardiogram     a. Echo 10/13: mild LVH, EF 55-60%, Gr 1 diast dysfn, PASP 32    Past Surgical History  Procedure Laterality Date  . Tympanoplasty  1960s    R side  . Coronary artery bypass graft  1998  . Cataract surgery    . Cholecystectomy    . Appendectomy      per pt report  . Colonoscopy  02/2010    extensive diverticulosis throughout, small internal hemorrhoids, rec rpt 5 yrs (Dr. Allyn Kenner)  . Hand surgery    . Lexiscan myoview  03/2011    Small basal inferolateral and anterolateral reversible perfusion defect suggests ischemia.  EF was normal.   old infarct, no new ischemia  . Toe surgery    . Abdominal hysterectomy  1975    TAH,BSO  . Vault prolapse a & p repair  2009    Dr [facility] hospital  . Dexa scan  09/2011    femur -0.8, forearm 0.6 => normal    History  Smoking status  . Never Smoker   Smokeless tobacco  . Never Used    History  Alcohol Use No    Family History  Problem Relation Age of Onset  . Hypertension Mother   . Stroke Mother   . Diabetes Father   . Diabetes Sister   . Diabetes Brother   . Colon cancer Neg Hx     Reviw of Systems:  Reviewed in the HPI.  All  other systems are negative.  Physical Exam: BP 110/50  Pulse 71  Ht 4\' 10"  (1.473 m)  Wt 158 lb 12.8 oz (72.031  kg)  BMI 33.2 kg/m2 The patient is alert and oriented x 3.  The mood and affect are normal.   Skin: warm and dry.  Color is normal.    HEENT:   Normocephalic/atraumatic. Neck is supple. Her mucous membranes are moist. The JVD. Her carotids are normal.  Lungs: Lung exam is clear.   She has a slight reddish raskhon her upper chest.    Heart: Regular rate S1-S2. She has no murmurs.    Abdomen: Abdomen soft.  Extremities:  No clubbing cyanosis or edema  Neuro:  Exam is nonfocal.    ECG: September 04, 2012:  NSR . Poor R wave progression.   Assessment / Plan:

## 2012-09-04 NOTE — Assessment & Plan Note (Signed)
[name] is doing well.  She had an abnormal myoview over a year ago and has not wanted to have a cath.  At this point, I suspect that this was likely an artifact - she has not had any problems with angina since that time.   She is limited by back pain and various aches and pains.  She has lots of abdominal pain.   i will see her again in 6 months.

## 2012-09-05 ENCOUNTER — Ambulatory Visit
Admission: RE | Admit: 2012-09-05 | Discharge: 2012-09-05 | Disposition: A | Discharge status: Home / Self Care | Payer: Medicare Other | Source: Ambulatory Visit | Attending: Family Medicine | Admitting: Family Medicine

## 2012-09-05 DIAGNOSIS — M47817 Spondylosis without myelopathy or radiculopathy, lumbosacral region: Secondary | ICD-10-CM | POA: Diagnosis not present

## 2012-09-05 DIAGNOSIS — G8929 Other chronic pain: Secondary | ICD-10-CM

## 2012-09-05 DIAGNOSIS — M48061 Spinal stenosis, lumbar region without neurogenic claudication: Secondary | ICD-10-CM | POA: Diagnosis not present

## 2012-09-06 ENCOUNTER — Ambulatory Visit (INDEPENDENT_AMBULATORY_CARE_PROVIDER_SITE_OTHER): Payer: Medicare Other | Admitting: Family Medicine

## 2012-09-06 ENCOUNTER — Encounter: Payer: Self-pay | Admitting: Radiology

## 2012-09-06 ENCOUNTER — Encounter: Payer: Self-pay | Admitting: Family Medicine

## 2012-09-06 VITALS — BP 132/72 | HR 83 | Temp 98.2°F | Wt 158.5 lb

## 2012-09-06 DIAGNOSIS — M48061 Spinal stenosis, lumbar region without neurogenic claudication: Secondary | ICD-10-CM | POA: Diagnosis not present

## 2012-09-06 DIAGNOSIS — M47817 Spondylosis without myelopathy or radiculopathy, lumbosacral region: Secondary | ICD-10-CM

## 2012-09-06 DIAGNOSIS — G8929 Other chronic pain: Secondary | ICD-10-CM

## 2012-09-06 DIAGNOSIS — R21 Rash and other nonspecific skin eruption: Secondary | ICD-10-CM | POA: Diagnosis not present

## 2012-09-06 DIAGNOSIS — M47816 Spondylosis without myelopathy or radiculopathy, lumbar region: Secondary | ICD-10-CM

## 2012-09-06 DIAGNOSIS — M545 Low back pain: Secondary | ICD-10-CM | POA: Diagnosis not present

## 2012-09-06 MED ORDER — TRIAMCINOLONE ACETONIDE 0.1 % EX CREA: TOPICAL_CREAM | Freq: Two times a day (BID) | CUTANEOUS | Status: DC

## 2012-09-06 NOTE — Assessment & Plan Note (Signed)
Unclear etiology.  Not consistent with bacterial or fungal infection.  Not consistent with eczema.  ?heat rash. Recommend treat with steroid course for possible inflammatory skin reaction to unknown exposure.   If not improving as expected, would have low threshold to treat empirically as cellulitis.

## 2012-09-06 NOTE — Progress Notes (Signed)
  Subjective:    [name], female    DOB: 1928/01/12, 79 y.o.   MRN: [id number]  HPI CC: rash on chest  Noticed for the last 4 days.  Seems to be spreading.  Itching and burning.  Has tried neosporin, benadryl.  No new lotions, detergents, soaps, or shampoos.  No new meds.  No new foods.  Wt Readings from Last 3 Encounters:  09/06/12 158 lb 8 oz (71.895 kg)  09/04/12 158 lb 12.8 oz (72.031 kg)  08/28/12 155 lb 4 oz (70.421 kg)    Had MRI done yesterday:  IMPRESSION:  1. Moderately severe spinal stenosis at L4-5 primarily due to  posterior element hypertrophy.  2. Moderately severe bilateral facet arthritis at L5-L1.  3. 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   Review of Systems Per HPI    Objective:   Physical Exam  Nursing note and vitals reviewed. Constitutional: She appears well-developed and well-nourished. No distress.  Skin: Skin is warm and dry. Rash noted. There is erythema.     Maculopapular rash on anterior chest, centered on right chest.  Pruritic and tender.  Crosses midline.  nonvesicular.      Assessment & Plan:

## 2012-09-06 NOTE — Assessment & Plan Note (Signed)
(  09/2012) MRI IMPRESSION:  1. Moderately severe spinal stenosis at L4-5 primarily due to posterior element hypertrophy.  2. Moderately severe bilateral facet arthritis at L5-L1.  3. 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 refer to spine clinic.

## 2012-09-06 NOTE — Patient Instructions (Addendum)
Pass by Marion's office for spine doctor referral For rash - treat with steroid cream and cool compresses - and let me know if not improved with this.

## 2012-09-07 ENCOUNTER — Telehealth: Payer: Self-pay

## 2012-09-07 MED ORDER — CEPHALEXIN 500 MG PO CAPS: 500.0000 mg | ORAL_CAPSULE | Freq: Three times a day (TID) | ORAL | Status: DC

## 2012-09-07 NOTE — Telephone Encounter (Signed)
Spoke with patient-  Itchy and tender.  Seems to be spreading.  No blisters. Will treat as possible cellulitis with keflex course, if not improved with this, will refer to derm for further evaluation. Pt agrees with plan.

## 2012-09-07 NOTE — Telephone Encounter (Signed)
Pt seen 09/06/12; pt using cream not helping rash and itching. Pt said rash is spreading and still hurts and itches. Pt said one of reddened areas on chest is raised but not blistered. Pt said needs med sent to [facility]. Pt request cb.

## 2012-09-13 ENCOUNTER — Other Ambulatory Visit: Payer: Self-pay | Admitting: Family Medicine

## 2012-09-13 ENCOUNTER — Telehealth: Payer: Self-pay | Admitting: *Deleted

## 2012-09-13 NOTE — Telephone Encounter (Signed)
Spoke with patient. She still has the rash. It is still red but may be drying up but is still slightly itchy. No blisters now. She wants a refill on abx because she knows she is "in danger" if she doesn't have something to take care of it now since she finishes the abx tomorrow.

## 2012-09-14 MED ORDER — CEPHALEXIN 500 MG PO CAPS: 500.0000 mg | ORAL_CAPSULE | Freq: Three times a day (TID) | ORAL | Status: DC

## 2012-09-14 NOTE — Telephone Encounter (Signed)
Attempted to call patient. No answer, no machine at either listed number. Will try again later.

## 2012-09-14 NOTE — Telephone Encounter (Signed)
Will do another short course of abx.  Sent in.

## 2012-09-17 NOTE — Telephone Encounter (Signed)
Patient notified

## 2012-09-18 NOTE — Telephone Encounter (Signed)
Enc opened in error

## 2012-09-23 ENCOUNTER — Other Ambulatory Visit: Payer: Self-pay | Admitting: Family Medicine

## 2012-09-23 NOTE — Telephone Encounter (Signed)
plz phone in hydrocodone

## 2012-09-24 DIAGNOSIS — IMO0002 Reserved for concepts with insufficient information to code with codable children: Secondary | ICD-10-CM | POA: Diagnosis not present

## 2012-09-24 DIAGNOSIS — M48062 Spinal stenosis, lumbar region with neurogenic claudication: Secondary | ICD-10-CM | POA: Diagnosis not present

## 2012-09-24 DIAGNOSIS — M47817 Spondylosis without myelopathy or radiculopathy, lumbosacral region: Secondary | ICD-10-CM | POA: Diagnosis not present

## 2012-09-24 NOTE — Telephone Encounter (Signed)
Rx called in as directed.   

## 2012-09-28 ENCOUNTER — Ambulatory Visit: Payer: Medicare Other | Admitting: Cardiovascular Disease

## 2012-10-05 ENCOUNTER — Other Ambulatory Visit: Payer: Self-pay | Admitting: Family Medicine

## 2012-10-08 ENCOUNTER — Encounter: Payer: Self-pay | Admitting: Gynecology

## 2012-10-09 DIAGNOSIS — M5137 Other intervertebral disc degeneration, lumbosacral region: Secondary | ICD-10-CM | POA: Diagnosis not present

## 2012-10-09 DIAGNOSIS — IMO0002 Reserved for concepts with insufficient information to code with codable children: Secondary | ICD-10-CM | POA: Diagnosis not present

## 2012-10-09 DIAGNOSIS — M48062 Spinal stenosis, lumbar region with neurogenic claudication: Secondary | ICD-10-CM | POA: Diagnosis not present

## 2012-10-11 ENCOUNTER — Other Ambulatory Visit: Payer: Self-pay

## 2012-10-22 ENCOUNTER — Encounter: Payer: Self-pay | Admitting: Family Medicine

## 2012-10-23 ENCOUNTER — Telehealth: Payer: Self-pay

## 2012-10-23 DIAGNOSIS — M48062 Spinal stenosis, lumbar region with neurogenic claudication: Secondary | ICD-10-CM | POA: Diagnosis not present

## 2012-10-23 DIAGNOSIS — IMO0002 Reserved for concepts with insufficient information to code with codable children: Secondary | ICD-10-CM | POA: Diagnosis not present

## 2012-10-23 DIAGNOSIS — M47817 Spondylosis without myelopathy or radiculopathy, lumbosacral region: Secondary | ICD-10-CM | POA: Diagnosis not present

## 2012-10-23 NOTE — Telephone Encounter (Signed)
Pt has finished samples of Align and pt has not had problem with her stomach since taking Align. Pt will get Align filled at [facility] and does not think she needs to see GI doctor.(this is f/u from 08/13/12 visit).

## 2012-10-24 NOTE — Telephone Encounter (Signed)
Noted.  Great!

## 2012-10-25 ENCOUNTER — Other Ambulatory Visit: Payer: Self-pay | Admitting: Family Medicine

## 2012-10-25 NOTE — Telephone Encounter (Signed)
Ok to refill 

## 2012-10-26 NOTE — Telephone Encounter (Signed)
Rx called in as directed.   

## 2012-10-26 NOTE — Telephone Encounter (Signed)
plz phone in. 

## 2012-10-29 DIAGNOSIS — F411 Generalized anxiety disorder: Secondary | ICD-10-CM | POA: Diagnosis not present

## 2012-11-05 ENCOUNTER — Encounter: Payer: Self-pay | Admitting: Family Medicine

## 2012-11-06 ENCOUNTER — Encounter: Payer: Self-pay | Admitting: Gynecology

## 2012-11-08 ENCOUNTER — Other Ambulatory Visit: Payer: Self-pay | Admitting: Gynecology

## 2012-11-09 ENCOUNTER — Encounter: Payer: Self-pay | Admitting: Radiology

## 2012-11-09 ENCOUNTER — Telehealth: Payer: Self-pay | Admitting: *Deleted

## 2012-11-09 NOTE — Telephone Encounter (Signed)
Pt nytating cream was denied by office, overdue for annual, last annual on 08/30/11. Pt had back issues and had to cancel her appt on 11/06/12. Pt will be having back surgery. Please advise

## 2012-11-11 NOTE — Telephone Encounter (Signed)
Not sure what cream you are talking about

## 2012-11-12 ENCOUNTER — Ambulatory Visit (INDEPENDENT_AMBULATORY_CARE_PROVIDER_SITE_OTHER): Payer: Medicare Other | Admitting: Family Medicine

## 2012-11-12 ENCOUNTER — Encounter: Payer: Self-pay | Admitting: Family Medicine

## 2012-11-12 VITALS — BP 142/64 | HR 88 | Temp 98.3°F | Wt 158.5 lb

## 2012-11-12 DIAGNOSIS — M79609 Pain in unspecified limb: Secondary | ICD-10-CM | POA: Diagnosis not present

## 2012-11-12 DIAGNOSIS — M79605 Pain in left leg: Secondary | ICD-10-CM | POA: Insufficient documentation

## 2012-11-12 DIAGNOSIS — W19XXXA Unspecified fall, initial encounter: Secondary | ICD-10-CM

## 2012-11-12 DIAGNOSIS — F411 Generalized anxiety disorder: Secondary | ICD-10-CM

## 2012-11-12 DIAGNOSIS — F419 Anxiety disorder, unspecified: Secondary | ICD-10-CM

## 2012-11-12 DIAGNOSIS — M79604 Pain in right leg: Secondary | ICD-10-CM

## 2012-11-12 MED ORDER — NYSTATIN-TRIAMCINOLONE 100000-0.1 UNIT/GM-% EX OINT: TOPICAL_OINTMENT | CUTANEOUS | Status: DC

## 2012-11-12 NOTE — Assessment & Plan Note (Signed)
Pt endorses increasing falls recently, unclear etiology - no syncope. States no time to participate in outpatient PT for balance training program - she will call us if desires referral.

## 2012-11-12 NOTE — Telephone Encounter (Signed)
Mycology cream is what is requesting. Okay to fill? Last Rx written on 08/24/12. Please advise

## 2012-11-12 NOTE — Patient Instructions (Addendum)
May take hydrocodone as needed. May take tylenol 500mg  up to three times a day as needed for pain. May take aleve 220mg  over the counter one pill at bedtime. Increase water intake, keep legs elevated during the day as much as able, continue wearing compression stockings. Follow up with Dr. Sharlet Salina at scheduled appointment 11/19/2012.  If shots don't work, talk with him about other options. Let me know when you have time for physical therapy.

## 2012-11-12 NOTE — Assessment & Plan Note (Signed)
bilat anterior leg pain L>R. Anticipate stemming from radiculopathy from lumbar spine in known h/o DDD- as well as likely CVI changes. Stable pulses. No claudication sxs. Recommended continue hydrocodone prn, restart tylenol and add aleve at night time. Lab Results  Component Value Date   CREATININE 0.9 07/09/2012

## 2012-11-12 NOTE — Progress Notes (Signed)
Subjective:    [name], female    DOB: [birth year], 79 y.o.   MRN: [id number]  HPI CC: follow up visit.  Mrs. Adwell presents today as routine follow up.  She wants to discuss leg pains -  L leg - pain starts in thigh and travels down to toes.  Excruciating pain.  Intermittent, worse with bending forward.  Pain worse in am, then with movement improves.  Currently no pain. R leg - anterior knee pain.  No posterior knee pain. Wears diabetic hose which helps. Has tried knee pad.  Had 2 falls last week - felt sleepy and fell down, hit right buttock, sore from this but improving.  Denies dizziness, lightheadedness, or presyncope.  No premonitory sxs.  No LOC.  No headache, chest pain or palpations.  Does not feel she has time to participate inf outpatient physical therapy for balance training currently.  Recently evaluated by spine clinic with L S1 transforaminal ESI (Dr. Sharlet Salina).  Has had 2 shots - have not helped lower back or leg pain so far.  Next appt 11/19/2012. (09/2012) MRI IMPRESSION:  1. Moderately severe spinal stenosis at L4-5 primarily due to posterior element hypertrophy.  2. Moderately severe bilateral facet arthritis at L5-L1.  3. 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  abd pain has improved with align. Lab Results  Component Value Date   CREATININE 0.9 07/09/2012    Past Medical History  Diagnosis Date  . Hypertension   . Coronary artery disease     CABG 1998  . Anxiety   . Diastolic dysfunction   . Hyperlipidemia   . PUD (peptic ulcer disease)   . Microcytic anemia   . Depression   . Diabetes mellitus     type 2, followed by endo.  . Diverticulosis   . Hemorrhoids   . History of heart attack   . Depression   . Irritable bowel syndrome   . Chronic back pain 03/2012    lumbar DDD with herniation - s/p L S1 transforaminal ESI (10/2012 Dr. Sharlet Salina)  . Varicose veins   . Cystocele   . Hx of echocardiogram     a. Echo  10/13: mild LVH, EF 55-60%, Gr 1 diast dysfn, PASP 32    Past Surgical History  Procedure Laterality Date  . Tympanoplasty  1960s    R side  . Coronary artery bypass graft  1998  . Cataract surgery    . Cholecystectomy    . Appendectomy      per pt report  . Colonoscopy  02/2010    extensive diverticulosis throughout, small internal hemorrhoids, rec rpt 5 yrs (Dr. Allyn Kenner)  . Hand surgery    . Lexiscan myoview  03/2011    Small basal inferolateral and anterolateral reversible perfusion defect suggests ischemia.  EF was normal.   old infarct, no new ischemia  . Toe surgery    . Abdominal hysterectomy  1975    TAH,BSO  . Vault prolapse a & p repair  2009    Dr [facility] hospital  . Dexa scan  09/2011    femur -0.8, forearm 0.6 => normal    Review of Systems Per HPI    Objective:   Physical Exam  Nursing note and vitals reviewed. Constitutional: She appears well-developed and well-nourished. No distress.  Needs assistance to get on exam table  Musculoskeletal: She exhibits edema (nonpitting edema bilaterally).  Varicose veins throughout lower legs. FROM at knees, no  popliteal fullness. 1+ DP bilaterally  Skin: Skin is warm and dry. No rash noted.  Psychiatric: She has a normal mood and affect.       Assessment & Plan:

## 2012-11-12 NOTE — Telephone Encounter (Signed)
Mycolog cream, 1 tube okay. Apply as needed for itching

## 2012-11-12 NOTE — Telephone Encounter (Signed)
RX SENT, PT INFORMED WITH THE BELOW NOTE

## 2012-11-14 ENCOUNTER — Other Ambulatory Visit: Payer: Self-pay | Admitting: Family Medicine

## 2012-11-14 DIAGNOSIS — Z79899 Other long term (current) drug therapy: Secondary | ICD-10-CM | POA: Diagnosis not present

## 2012-11-15 ENCOUNTER — Encounter: Payer: Self-pay | Admitting: Family Medicine

## 2012-11-17 ENCOUNTER — Other Ambulatory Visit: Payer: Self-pay | Admitting: Family Medicine

## 2012-11-19 DIAGNOSIS — IMO0002 Reserved for concepts with insufficient information to code with codable children: Secondary | ICD-10-CM | POA: Diagnosis not present

## 2012-11-19 DIAGNOSIS — M47817 Spondylosis without myelopathy or radiculopathy, lumbosacral region: Secondary | ICD-10-CM | POA: Diagnosis not present

## 2012-11-19 DIAGNOSIS — M5137 Other intervertebral disc degeneration, lumbosacral region: Secondary | ICD-10-CM | POA: Diagnosis not present

## 2012-11-19 DIAGNOSIS — M48062 Spinal stenosis, lumbar region with neurogenic claudication: Secondary | ICD-10-CM | POA: Diagnosis not present

## 2012-11-23 ENCOUNTER — Other Ambulatory Visit: Payer: Self-pay | Admitting: Family Medicine

## 2012-11-23 NOTE — Telephone Encounter (Signed)
Okay to fill? 

## 2012-11-25 NOTE — Telephone Encounter (Signed)
plz phone in. 

## 2012-11-26 NOTE — Telephone Encounter (Signed)
Rx called to pharmacy

## 2012-11-29 ENCOUNTER — Encounter: Payer: Self-pay | Admitting: Family Medicine

## 2012-12-05 ENCOUNTER — Other Ambulatory Visit: Payer: Self-pay | Admitting: Gynecology

## 2012-12-05 ENCOUNTER — Ambulatory Visit: Payer: Medicare Other | Admitting: Endocrinology

## 2012-12-10 ENCOUNTER — Encounter: Payer: Self-pay | Admitting: Endocrinology

## 2012-12-10 ENCOUNTER — Ambulatory Visit (INDEPENDENT_AMBULATORY_CARE_PROVIDER_SITE_OTHER): Payer: Medicare Other | Admitting: Endocrinology

## 2012-12-10 VITALS — BP 180/82 | HR 70 | Temp 98.7°F | Resp 12 | Ht <= 58 in | Wt 160.8 lb

## 2012-12-10 DIAGNOSIS — E1165 Type 2 diabetes mellitus with hyperglycemia: Secondary | ICD-10-CM

## 2012-12-10 DIAGNOSIS — I10 Essential (primary) hypertension: Secondary | ICD-10-CM

## 2012-12-10 DIAGNOSIS — E785 Hyperlipidemia, unspecified: Secondary | ICD-10-CM

## 2012-12-10 NOTE — Patient Instructions (Addendum)
Please check blood sugars at least half the time about 2 hours after any meal and as directed on waking up. Please bring blood sugar monitor to each visit

## 2012-12-10 NOTE — Progress Notes (Signed)
Patient ID: SILVA SCHOPP, female   DOB: 03/19/1928, 79 y.o.   MRN: [id number]  [name] is an 79 y.o. female.   Reason for Appointment: Diabetes follow-up   History of Present Illness   Diagnosis: Type 2 DIABETES MELITUS, date of diagnosis: 1991   She has had mild diabetes for several years and has been on Actos only since about 2001 Her A1c has generally been under 7% She was given a trial of metformin because of her difficulty with weight loss and also edema but she claims it made her blood sugars go up and will not try it again Also she thinks her blood sugars were higher with Januvia Currently not having any side effects from Actos         Oral hypoglycemic drugs: Actos        Side effects from medications: None Proper timing of medications in relation to meals: Yes.         Monitors blood glucose: Once a day.    Glucometer: One Touch.          Blood Glucose readings from recall: readings before breakfast:  117, 89; afternoon 99- 120?  Hypoglycemia frequency: Never.          Meals: 3 meals per day.  usually compliant with diet         Physical activity:  minimal           Dietician visit: Most recent:Several years ago           Last pneumococcal vaccine: 2005 The last HbgA1c was reported as 6.7 in 1/14    Wt Readings from Last 3 Encounters:  12/10/12 160 lb 12.8 oz (72.938 kg)  11/12/12 158 lb 8 oz (71.895 kg)  09/06/12 158 lb 8 oz (71.895 kg)       Medication List       This list is accurate as of: 12/10/12  2:12 PM.  Always use your most recent med list.               acetaminophen 500 MG tablet  Commonly known as:  TYLENOL  Take 500 mg by mouth every 8 (eight) hours as needed.     ALIGN 4 MG Caps  Take 1 capsule by mouth daily.     ALPRAZolam 1 MG tablet  Commonly known as:  XANAX  Take 0.5 mg by mouth 4 (four) times daily. For anxiety     aspirin 81 MG tablet  Take 81 mg by mouth daily.     CALCIUM PO  Take 1 tablet by mouth daily.     DEXILANT 30 MG capsule  Generic drug:  Dexlansoprazole  TAKE 1 CAPSULE BY MOUTH DAILY     estradiol 0.5 MG tablet  Commonly known as:  ESTRACE  TAKE 1 TABLET BY MOUTH DAILY     gabapentin 100 MG capsule  Commonly known as:  NEURONTIN  Take 1 capsule (100 mg total) by mouth 2 (two) times daily.     glucosamine-chondroitin 500-400 MG tablet  Take 1 tablet by mouth daily.     HYDROcodone-acetaminophen 5-325 MG per tablet  Commonly known as:  NORCO/VICODIN  TAKE 1/2 TABLET BY MOUTH EVERY 6 HOURS AS NEEDED     multivitamin tablet  Take 1 tablet by mouth daily.     naproxen sodium 220 MG tablet  Commonly known as:  ANAPROX  Take 220 mg by mouth at bedtime.     nitroGLYCERIN 0.4 MG SL tablet  Commonly known as:  NITROSTAT  Place 0.4 mg under the tongue every 5 (five) minutes as needed. For chest pain May take up to 3 doses, if pain persists call 911.     nystatin-triamcinolone ointment  Commonly known as:  MYCOLOG  APPLY TO AFFECTED AREA AS NEEDED     pioglitazone 45 MG tablet  Commonly known as:  ACTOS  Take 45 mg by mouth daily.     pravastatin 20 MG tablet  Commonly known as:  PRAVACHOL  TAKE 1 TABLET BY MOUTH DAILY.     ramipril 10 MG capsule  Commonly known as:  ALTACE  TAKE 1 CAPSULE BY MOUTH EVERY DAY     traZODone 150 MG tablet  Commonly known as:  DESYREL  Take 150 mg by mouth at bedtime.     triamcinolone cream 0.1 %  Commonly known as:  KENALOG  Apply topically 2 (two) times daily. Apply to AA.     triamterene-hydrochlorothiazide 37.5-25 MG per capsule  Commonly known as:  DYAZIDE  Take 1 capsule by mouth daily.     triamterene-hydrochlorothiazide 37.5-25 MG per tablet  Commonly known as:  MAXZIDE-25  TAKE 1 TABLET BY MOUTH EVERY MORNING.     VITAMIN B 12 PO  Take 1 tablet by mouth daily.     VITAMIN C PO  Take 1 tablet by mouth daily.     VITAMIN E PO  Take 1 tablet by mouth daily.        Allergies:  Allergies  Allergen Reactions  .  Amoxicillin-Pot Clavulanate Diarrhea  . Metformin And Related Diarrhea    Past Medical History  Diagnosis Date  . Hypertension   . Coronary artery disease     CABG 1998  . Anxiety   . Diastolic dysfunction   . Hyperlipidemia   . PUD (peptic ulcer disease)   . Microcytic anemia   . Depression   . Diabetes mellitus     type 2, followed by endo.  . Diverticulosis   . Hemorrhoids   . History of heart attack   . Depression   . Irritable bowel syndrome   . Chronic back pain 03/2012    lumbar DDD with herniation - s/p L S1 transforaminal ESI (10/2012 Dr. Sharlet Salina)  . Varicose veins   . Cystocele   . Hx of echocardiogram     a. Echo 10/13: mild LVH, EF 55-60%, Gr 1 diast dysfn, PASP 32    Past Surgical History  Procedure Laterality Date  . Tympanoplasty  1960s    R side  . Coronary artery bypass graft  1998  . Cataract surgery    . Cholecystectomy    . Appendectomy      per pt report  . Colonoscopy  02/2010    extensive diverticulosis throughout, small internal hemorrhoids, rec rpt 5 yrs (Dr. Allyn Kenner)  . Hand surgery    . Lexiscan myoview  03/2011    Small basal inferolateral and anterolateral reversible perfusion defect suggests ischemia.  EF was normal.   old infarct, no new ischemia  . Toe surgery    . Abdominal hysterectomy  1975    TAH,BSO  . Vault prolapse a & p repair  2009    Dr [facility] hospital  . Dexa scan  09/2011    femur -0.8, forearm 0.6 => normal  . Esi  2014    L S1 transforaminal ESI x2 (Chasnis)    Family History  Problem Relation Age of Onset  . Hypertension  Mother   . Stroke Mother   . Diabetes Father   . Diabetes Sister   . Diabetes Brother   . Colon cancer Neg Hx     Social History:  reports that she has never smoked. She has never used smokeless tobacco. She reports that she does not drink alcohol or use illicit drugs.  Review of Systems:  HYPERTENSION:  she is having good control usually with ramipril, blood pressure initially  high today, usually well controlled with PCP  HYPERLIPIDEMIA: The lipid abnormality consists of elevated LDL treated with pravastatin low-dose.  Low back pain: Has been seen by PCP for this Asking about leg pains which is mostly in her ankle joints     Examination:   BP 180/82  Pulse 70  Temp(Src) 98.7 F (37.1 C)  Resp 12  Ht 4\' 10"  (1.473 m)  Wt 160 lb 12.8 oz (72.938 kg)  BMI 33.62 kg/m2  SpO2 99%  Body mass index is 33.62 kg/(m^2).   No pedal edema  ASSESSMENT/ PLAN::   Diabetes type 2   The patient's diabetes control appears to be fairly good but will assess with A1c today She has generally been well controlled with Actos alone and does not have any edema but this now  Hypertension: She will followup with her PCP and cardiologist  Ankle pain: Advised her to see PCP orthopedic surgeon  [facility] 12/10/2012, 2:12 PM   Addendum: A1c 7.3, adequate for her age  Office Visit on 12/10/2012  Component Date Value Range Status  . Cholesterol 12/10/2012 119  0 - 200 mg/dL Final   ATP III Classification       Desirable:  < 200 mg/dL               Borderline High:  200 - 239 mg/dL          High:  > = 240 mg/dL  . Triglycerides 12/10/2012 60.0  0.0 - 149.0 mg/dL Final   Normal:  <150 mg/dLBorderline High:  150 - 199 mg/dL  . HDL 12/10/2012 46.90  >39.00 mg/dL Final  . VLDL 12/10/2012 12.0  0.0 - 40.0 mg/dL Final  . LDL Cholesterol 12/10/2012 60  0 - 99 mg/dL Final  . Total CHOL/HDL Ratio 12/10/2012 3   Final                  Men          Women1/2 Average Risk     3.4          3.3Average Risk          5.0          4.42X Average Risk          9.6          7.13X Average Risk          15.0          11.0                      . Sodium 12/10/2012 137  135 - 145 mEq/L Final  . Potassium 12/10/2012 4.7  3.5 - 5.1 mEq/L Final  . Chloride 12/10/2012 103  96 - 112 mEq/L Final  . CO2 12/10/2012 29  19 - 32 mEq/L Final  . Glucose, Bld 12/10/2012 79  70 - 99 mg/dL Final  . BUN 12/10/2012  35* 6 - 23 mg/dL Final  . Creatinine, Ser 12/10/2012 0.9  0.4 - 1.2 mg/dL Final  .  Total Bilirubin 12/10/2012 0.4  0.3 - 1.2 mg/dL Final  . Alkaline Phosphatase 12/10/2012 99  39 - 117 U/L Final  . AST 12/10/2012 22  0 - 37 U/L Final  . ALT 12/10/2012 16  0 - 35 U/L Final  . Total Protein 12/10/2012 6.8  6.0 - 8.3 g/dL Final  . Albumin 12/10/2012 3.6  3.5 - 5.2 g/dL Final  . Calcium 12/10/2012 9.2  8.4 - 10.5 mg/dL Final  . GFR 12/10/2012 67.80  >60.00 mL/min Final  . Hemoglobin A1C 12/10/2012 7.3* 4.6 - 6.5 % Final   Glycemic Control Guidelines for People with Diabetes:Non Diabetic:  <6%Goal of Therapy: <7%Additional Action Suggested:  >8%

## 2012-12-11 LAB — COMPREHENSIVE METABOLIC PANEL
ALT: 16 U/L (ref 0–35)
AST: 22 U/L (ref 0–37)
CO2: 29 mEq/L (ref 19–32)
Calcium: 9.2 mg/dL (ref 8.4–10.5)
Chloride: 103 mEq/L (ref 96–112)
GFR: 67.8 mL/min (ref >60.00)
Potassium: 4.7 mEq/L (ref 3.5–5.1)
Sodium: 137 mEq/L (ref 135–145)
Total Protein: 6.8 g/dL (ref 6.0–8.3)

## 2012-12-11 LAB — LIPID PANEL
LDL Cholesterol: 60 mg/dL (ref 0–99)
Total CHOL/HDL Ratio: 3

## 2012-12-21 ENCOUNTER — Other Ambulatory Visit: Payer: Self-pay | Admitting: Family Medicine

## 2012-12-21 NOTE — Telephone Encounter (Signed)
Please call in

## 2012-12-21 NOTE — Telephone Encounter (Signed)
Ok to refill in Dr. Synthia Innocent absence? Last filled 11/23/12.

## 2012-12-21 NOTE — Telephone Encounter (Signed)
Rx called in as directed.   

## 2013-01-01 ENCOUNTER — Encounter: Payer: Self-pay | Admitting: Gynecology

## 2013-01-01 ENCOUNTER — Ambulatory Visit (INDEPENDENT_AMBULATORY_CARE_PROVIDER_SITE_OTHER): Payer: Medicare Other | Admitting: Gynecology

## 2013-01-01 VITALS — BP 120/76 | Ht <= 58 in | Wt 162.0 lb

## 2013-01-01 DIAGNOSIS — L293 Anogenital pruritus, unspecified: Secondary | ICD-10-CM | POA: Diagnosis not present

## 2013-01-01 DIAGNOSIS — N8111 Cystocele, midline: Secondary | ICD-10-CM | POA: Diagnosis not present

## 2013-01-01 DIAGNOSIS — Z23 Encounter for immunization: Secondary | ICD-10-CM

## 2013-01-01 DIAGNOSIS — Z79890 Hormone replacement therapy (postmenopausal): Secondary | ICD-10-CM

## 2013-01-01 DIAGNOSIS — N951 Menopausal and female climacteric states: Secondary | ICD-10-CM | POA: Diagnosis not present

## 2013-01-01 DIAGNOSIS — N898 Other specified noninflammatory disorders of vagina: Secondary | ICD-10-CM

## 2013-01-01 DIAGNOSIS — N952 Postmenopausal atrophic vaginitis: Secondary | ICD-10-CM | POA: Diagnosis not present

## 2013-01-01 DIAGNOSIS — IMO0002 Reserved for concepts with insufficient information to code with codable children: Secondary | ICD-10-CM

## 2013-01-01 MED ORDER — NYSTATIN-TRIAMCINOLONE 100000-0.1 UNIT/GM-% EX OINT: TOPICAL_OINTMENT | CUTANEOUS | Status: DC

## 2013-01-01 NOTE — Progress Notes (Signed)
Dwight [birth year] [id number]        79 y.o.  [id number] for followup exam.  Several issues noted below.  Past medical history,surgical history, medications, allergies, family history and social history were all reviewed and documented in the EPIC chart.  ROS:  Performed and pertinent positives and negatives are included in the history, assessment and plan .  Exam: Kim assistant Filed Vitals:   01/01/13 1203  BP: 120/76  Height: 4\' 9"  (1.448 m)  Weight: 162 lb (73.483 kg)   General appearance  Normal Skin grossly normal Head/Neck normal with no cervical or supraclavicular adenopathy thyroid normal Lungs  clear Cardiac RR, without RMG Abdominal  soft, nontender, without masses, organomegaly or hernia Breasts  examined lying and sitting without masses, retractions, discharge or axillary adenopathy. Pelvic  Ext/BUS/vagina  generalized atrophic changes. High cystocele 2 introital opening begins above mesh.  Adnexa  Without masses or tenderness    Anus and perineum  normal   Rectovaginal  normal sphincter tone without palpated masses or tenderness.    Assessment/Plan:  79 y.o. [id number] female for followup exam.   1. Cystocele. Patient had vaginal vault prolapse A&P repair 2009 by Dr. April Manson. Has had a recurrent cystocele above the mesh. Stable by exam and not overly bothersome to the patient. Not having significant urinary issues with incontinence. We'll continue to monitor. 2. Atrophic genital changes. With chronic vulvar pruritus that Mytrex cream seems to help.  Refill for the Mytrex cream provided. Will use when necessary. 3. Menopausal symptoms. Patient noting unacceptable hot flushes and was placed on Estrace 0.5 mg tablets by Dr. Danise Mina.  I reviewed the whole issue of HRT with her to include the WHI study with increased risk of stroke, heart attack, DVT and breast cancer. The ACOG and NAMS statements for lowest dose for the shortest period of time reviewed. Transdermal versus  oral first-pass effect benefit discussed. I reviewed my concern given her significant cardiac history and vascular insufficiency and her age the risk of thrombosis as outlined above. It was my recommendation that she stop the HRT. I also discussed decrease risk of thrombosis with transdermal use and that she would want to continue to consider switching over but she declines. She'll continue to followup with Dr. Danise Mina in reference to this. 4. Pap smear several years ago. No history of abnormal Pap smears previously. As per current screening guidelines. Screening as she is over the age of 53 and status post hysterectomy for benign indications. 5. Mammography 2012. Recommended screening mammogram now and she understands my recommendations. SBE monthly reviewed. 6. DEXA 2013 normal. Repeated five-year interval. 7. Colonoscopy 2011. Repeat at their recommended interval. 8. Health maintenance. No blood work done as this is done through her primary physician's office. Followup one year, sooner as needed.  Note: This document was prepared with digital dictation and possible smart phrase technology. Any transcriptional errors that result from this process are unintentional.   Anastasio Auerbach MD, 12:33 PM 01/01/2013

## 2013-01-01 NOTE — Patient Instructions (Addendum)
Followup in one year for reexamination.  Call to Schedule your mammogram  Facilities in [place]: 1)  [facility], [address] Phone: [phone] 2)  [facility]. [address] [place] Phone: [phone] 3)  Dr. Isaiah Blakes at [facility] [address] Phone: [phone]     Mammogram A mammogram is an X-ray test to find changes in a woman's breast. You should get a mammogram if:  You are 77 years of age or older  You have risk factors.   Your doctor recommends that you have one.  BEFORE THE TEST  Do not schedule the test the week before your period, especially if your breasts are sore during this time.  On the day of your mammogram:  Wash your breasts and armpits well. After washing, do not put on any deodorant or talcum powder on until after your test.   Eat and drink as you usually do.   Take your medicines as usual.   If you are diabetic and take insulin, make sure you:   Eat before coming for your test.   Take your insulin as usual.   If you cannot keep your appointment, call before the appointment to cancel. Schedule another appointment.  TEST  You will need to undress from the waist up. You will put on a hospital gown.   Your breast will be put on the mammogram machine, and it will press firmly on your breast with a piece of plastic called a compression paddle. This will make your breast flatter so that the machine can X-ray all parts of your breast.   Both breasts will be X-rayed. Each breast will be X-rayed from above and from the side. An X-ray might need to be taken again if the picture is not good enough.   The mammogram will last about 15 to 30 minutes.  AFTER THE TEST Finding out the results of your test Ask when your test results will be ready. Make sure you get your test results.  Document Released: 06/17/2008 Document Revised: 03/10/2011 Document  Reviewed: 06/17/2008 [facility] Patient Information 2012 [place].

## 2013-01-01 NOTE — Addendum Note (Signed)
Addended by: Nelva Nay on: 01/01/2013 12:46 PM   Modules accepted: Orders

## 2013-01-02 ENCOUNTER — Other Ambulatory Visit: Payer: Self-pay | Admitting: *Deleted

## 2013-01-02 LAB — URINALYSIS W MICROSCOPIC + REFLEX CULTURE
Bilirubin Urine: NEGATIVE
Crystals: NONE SEEN
Glucose, UA: NEGATIVE mg/dL
Ketones, ur: NEGATIVE mg/dL
Protein, ur: NEGATIVE mg/dL
Urobilinogen, UA: 1 mg/dL (ref 0.0–1.0)

## 2013-01-02 MED ORDER — PIOGLITAZONE HCL 45 MG PO TABS: 45.0000 mg | ORAL_TABLET | Freq: Every day | ORAL | Status: DC

## 2013-01-22 ENCOUNTER — Encounter: Payer: Self-pay | Admitting: Family Medicine

## 2013-01-22 ENCOUNTER — Ambulatory Visit (INDEPENDENT_AMBULATORY_CARE_PROVIDER_SITE_OTHER): Payer: Medicare Other | Admitting: Family Medicine

## 2013-01-22 VITALS — BP 142/70 | HR 84 | Temp 98.1°F | Wt 163.5 lb

## 2013-01-22 DIAGNOSIS — R609 Edema, unspecified: Secondary | ICD-10-CM | POA: Diagnosis not present

## 2013-01-22 DIAGNOSIS — R600 Localized edema: Secondary | ICD-10-CM

## 2013-01-22 DIAGNOSIS — I519 Heart disease, unspecified: Secondary | ICD-10-CM | POA: Diagnosis not present

## 2013-01-22 DIAGNOSIS — R079 Chest pain, unspecified: Secondary | ICD-10-CM | POA: Diagnosis not present

## 2013-01-22 DIAGNOSIS — I5189 Other ill-defined heart diseases: Secondary | ICD-10-CM

## 2013-01-22 DIAGNOSIS — R0609 Other forms of dyspnea: Secondary | ICD-10-CM

## 2013-01-22 DIAGNOSIS — R0600 Dyspnea, unspecified: Secondary | ICD-10-CM

## 2013-01-22 DIAGNOSIS — R21 Rash and other nonspecific skin eruption: Secondary | ICD-10-CM

## 2013-01-22 LAB — CBC WITH DIFFERENTIAL/PLATELET
Basophils Absolute: 0 10*3/uL (ref 0.0–0.1)
Basophils Relative: 0.6 % (ref 0.0–3.0)
Eosinophils Absolute: 0.2 10*3/uL (ref 0.0–0.7)
Eosinophils Relative: 1.9 % (ref 0.0–5.0)
Hemoglobin: 10.8 g/dL — ABNORMAL LOW (ref 12.0–15.0)
MCHC: 32.1 g/dL (ref 30.0–36.0)
MCV: 80.5 fl (ref 78.0–100.0)
Monocytes Absolute: 0.7 10*3/uL (ref 0.1–1.0)
Neutro Abs: 5.5 10*3/uL (ref 1.4–7.7)
RBC: 4.17 Mil/uL (ref 3.87–5.11)
RDW: 18.2 % — ABNORMAL HIGH (ref 11.5–14.6)

## 2013-01-22 LAB — BASIC METABOLIC PANEL
BUN: 33 mg/dL — ABNORMAL HIGH (ref 6–23)
Chloride: 101 mEq/L (ref 96–112)
Creatinine, Ser: 1 mg/dL (ref 0.4–1.2)
GFR: 59.62 mL/min — ABNORMAL LOW (ref >60.00)
Potassium: 4.8 mEq/L (ref 3.5–5.1)

## 2013-01-22 NOTE — Progress Notes (Signed)
Subjective:    [name], female    DOB: May 13, 1927, 79 y.o.   MRN: [id number]  HPI CC: check legs.  79 yo with h/o HTN, HLD, T2DM, CAD s/p CABG 1998, significant anxiety, diastolic dysfunction, CVI, and chronic lower back pain from spinal stenosis and lumbar DDD presents today with concern over leg pain and swelling.  R leg swelling and bilateral ankle pain over the last week.  Some improved over last few days.  Hasn't tried anything for swelling or pain.  Takes hydrocodone throughout the day and aleve at night time.  Rash noticed on right leg 1 wk ago - nontender or itchy.  Right medial leg at site of prior bypass harvest scar - small erythematous patch.   Chronic back pain - known spinal stenosis and lumbar DDD - transforaminal ESI by Dr. Sharlet Salina did not help.   Having dull substernal chest pain as well as mild dypsnea worse with stress and after [place].  She was told to decrease maxzide to 1/2 tablet daily but forgets by whom.  Denies orthopnea, denies persistent chest pain,  Missed last cardiologist appt.  Pt states she's due for f/u.  She is on hormonal therapy - estrogen 0.5mg  1/2 tablet daily - trial off this severely worsened anxiety and hot flashes.  Aware of cardiovascular risks of med.  Wt Readings from Last 3 Encounters:  01/22/13 163 lb 8 oz (74.163 kg)  01/01/13 162 lb (73.483 kg)  12/10/12 160 lb 12.8 oz (72.938 kg)   BP Readings from Last 3 Encounters:  01/22/13 142/70  01/01/13 120/76  12/10/12 180/82    Past Medical History  Diagnosis Date  . Hypertension   . Coronary artery disease     CABG 1998  . Anxiety   . Diastolic dysfunction   . Hyperlipidemia   . PUD (peptic ulcer disease)   . Microcytic anemia   . Depression   . Diabetes mellitus     type 2, followed by endo.  . Diverticulosis   . Hemorrhoids   . History of heart attack   . Depression   . Irritable bowel syndrome   . Chronic back pain 03/2012    lumbar DDD with  herniation - s/p L S1 transforaminal ESI (10/2012 Dr. Sharlet Salina)  . Varicose veins   . Cystocele   . Hx of echocardiogram     a. Echo 10/13: mild LVH, EF 55-60%, Gr 1 diast dysfn, PASP 32    Past Surgical History  Procedure Laterality Date  . Tympanoplasty  1960s    R side  . Coronary artery bypass graft  1998  . Cataract surgery    . Cholecystectomy    . Appendectomy      per pt report  . Colonoscopy  02/2010    extensive diverticulosis throughout, small internal hemorrhoids, rec rpt 5 yrs (Dr. Allyn Kenner)  . Hand surgery    . Lexiscan myoview  03/2011    Small basal inferolateral and anterolateral reversible perfusion defect suggests ischemia.  EF was normal.   old infarct, no new ischemia  . Toe surgery    . Abdominal hysterectomy  1975    TAH,BSO  . Vault prolapse a & p repair  2009    Dr [facility] hospital  . Dexa scan  09/2011    femur -0.8, forearm 0.6 => normal  . Esi  2014    L S1 transforaminal ESI x2 (Chasnis)   Review of Systems Per HPI  Objective:   Physical Exam  Nursing note and vitals reviewed. Constitutional: She appears well-developed and well-nourished. No distress.  HENT:  Mouth/Throat: Oropharynx is clear and moist. No oropharyngeal exudate.  Cardiovascular: Normal rate, regular rhythm, normal heart sounds and intact distal pulses.   No murmur heard. Pulmonary/Chest: Effort normal and breath sounds normal. No respiratory distress. She has no wheezes. She has no rales.  Musculoskeletal: She exhibits edema (R 1+, L trace pitting edema).  2+ DP bilaterally No palpable cords No calf tenderness Scars from vein harvesting for CABG  Skin: Skin is warm and dry. Rash noted.  Small patch of nonblanching erythematous rash right medial leg       Assessment & Plan:

## 2013-01-22 NOTE — Assessment & Plan Note (Signed)
Recently worsening - new complaint. In h/o diastolic dysfunction, weight trending up (9lbs in last 3 months), and worsening leg swelling, concern for CHF flare. Will recommend she increase maxzide to a whole tablet daily (prior only taking 1/2 tablet) and elevate legs. To call me with update if this doesn't help , would prescribe short lasix course. Pt agrees with plan. I also suggested she return to see Dr. Acie Fredrickson as she states she's overdue - she will call to schedule appointment. Check CBC, BMP, and BNP today.

## 2013-01-22 NOTE — Assessment & Plan Note (Addendum)
New, seems to be resolving on its own - continue to monitor.  ?posttrauma healing bruise,?vasculitis.

## 2013-01-22 NOTE — Assessment & Plan Note (Signed)
R>L but that is site of larger vein harvesting so more prone to swelling. Not consistent with DVT today. Anticipate CVI, ?possible acute diastolic CHF exac. See below for plan.  Encouraged continued leg elevation and increased diuretic.

## 2013-01-22 NOTE — Assessment & Plan Note (Signed)
Mild diastolic dysfunction by echo done 01/2012, EF 55-60%. Increase maxzide.  If ineffective, start lasix.

## 2013-01-22 NOTE — Patient Instructions (Addendum)
Increase triamterene hydrochlorothiazide to 1 pill daily- - if this doesn't help with swelling and shortness of breath, let me know for stronger medicine. Blood work today. Call to schedule follow up with Dr. Acie Fredrickson. If worsening chest pain or worsening shortness of breath despite above, please seek urgent care. Return to see me in 2 months for follow up.

## 2013-01-24 ENCOUNTER — Other Ambulatory Visit: Payer: Self-pay

## 2013-01-24 MED ORDER — HYDROCODONE-ACETAMINOPHEN 5-325 MG PO TABS: ORAL_TABLET | ORAL | Status: DC

## 2013-01-24 NOTE — Telephone Encounter (Signed)
Printed and placed in Kim's box. 

## 2013-01-24 NOTE — Telephone Encounter (Signed)
Message left with female that Rx was ready. Rx placed up front for pick up.

## 2013-01-24 NOTE — Telephone Encounter (Signed)
Pt left v/m requesting rx hydrocodone apap; call; pt when ready for pickup. Pt also request recent lab results.Please advise.

## 2013-01-31 ENCOUNTER — Other Ambulatory Visit: Payer: Self-pay | Admitting: Family Medicine

## 2013-02-15 ENCOUNTER — Telehealth: Payer: Self-pay | Admitting: Family Medicine

## 2013-02-15 NOTE — Telephone Encounter (Signed)
Patient notified. Appt offered for today with Dr. Damita Dunnings since no openings with Dr. Darnell Level. She refused because she had to go to work. Scheduled for Monday at patient's request.

## 2013-02-15 NOTE — Telephone Encounter (Signed)
Will need eval for this in office.

## 2013-02-15 NOTE — Telephone Encounter (Signed)
Patient Information:  Caller Name: Clytie  Phone: [phone]  Patient: [name], [name]  Gender: Female  DOB: 1927-06-12  Age: 79 Years  PCP: [facility])  Office Follow Up:  Does the office need to follow up with this patient?: Yes  Instructions For The Office: Please review information; Patient requests  Rx for her rash/irritation in genital area.  Uses [facility] [place]/ [address]  [phone].   Symptoms  Reason For Call & Symptoms: Patient calling about "gaulded" area in right groin from vagina, extends 6-8".  Emergent symptoms ruled out.  See Today or Tomorrow in Office per Vulvar Symptoms protocol due to Rash of genital area present > 24 hours.  Reviewed Health History In EMR: Yes  Reviewed Medications In EMR: Yes  Reviewed Allergies In EMR: Yes  Reviewed Surgeries / Procedures: Yes  Date of Onset of Symptoms: 02/13/2013  Treatments Tried: Lotrimin - minimal improvement  Treatments Tried Worked: No  Guideline(s) Used:  Rash or Redness - Localized  Vulvar Symptoms  Disposition Per Guideline:   See Today or Tomorrow in Office  Reason For Disposition Reached:   Rash (e.g., redness, tiny bumps, sore) of genital area present > 24 hours  Advice Given:  Genital Hygiene:  Keep your genital area clean. Wash daily.  Keep your genital area dry. Wear cotton underwear or underwear with a cotton crotch.  Do not use feminine hygiene products.  Cleaning:  Wash the area once thoroughly with un-scented soap and water to remove any irritants.  Call Back If:  Any rash lasts longer than 24 hours  Fever occurs  Yellow or green vaginal discharge occurs  You become worse.  Patient Refused Recommendation:  Patient Requests Prescription  Patient states she does not want to come to office; she asks for Rx.

## 2013-02-18 ENCOUNTER — Ambulatory Visit (INDEPENDENT_AMBULATORY_CARE_PROVIDER_SITE_OTHER): Payer: Medicare Other | Admitting: Family Medicine

## 2013-02-18 ENCOUNTER — Encounter: Payer: Self-pay | Admitting: Family Medicine

## 2013-02-18 VITALS — BP 128/84 | HR 92 | Temp 98.2°F | Wt 158.0 lb

## 2013-02-18 DIAGNOSIS — F411 Generalized anxiety disorder: Secondary | ICD-10-CM

## 2013-02-18 DIAGNOSIS — R1310 Dysphagia, unspecified: Secondary | ICD-10-CM | POA: Diagnosis not present

## 2013-02-18 DIAGNOSIS — R21 Rash and other nonspecific skin eruption: Secondary | ICD-10-CM

## 2013-02-18 DIAGNOSIS — F419 Anxiety disorder, unspecified: Secondary | ICD-10-CM

## 2013-02-18 MED ORDER — VALACYCLOVIR HCL 500 MG PO TABS: 500.0000 mg | ORAL_TABLET | Freq: Three times a day (TID) | ORAL | Status: DC

## 2013-02-18 MED ORDER — HYDROCODONE-ACETAMINOPHEN 5-325 MG PO TABS: ORAL_TABLET | ORAL | Status: DC

## 2013-02-18 MED ORDER — VALACYCLOVIR HCL 500 MG PO TABS: 1000.0000 mg | ORAL_TABLET | Freq: Two times a day (BID) | ORAL | Status: DC

## 2013-02-18 MED ORDER — CLOTRIMAZOLE 1 % EX CREA: 1.0000 "application " | TOPICAL_CREAM | Freq: Two times a day (BID) | CUTANEOUS | Status: DC

## 2013-02-18 NOTE — Progress Notes (Signed)
Pre-visit discussion using our clinic review tool. No additional management support is needed unless otherwise documented below in the visit note.  

## 2013-02-18 NOTE — Assessment & Plan Note (Signed)
I asked her to return to see Dr. Allyn Kenner her gastroenterologist to further evaluate dysphagia complaint.

## 2013-02-18 NOTE — Assessment & Plan Note (Signed)
Candidal rash vs shingles - will treat for both with valtrex course as well as clotrimazole cream bid to affected area for 2 wks.

## 2013-02-18 NOTE — Progress Notes (Signed)
  Subjective:    [name], female    DOB: 08/07/1927, 79 y.o.   MRN: [id number]  HPI CC: groin rash  Woke up last week with rash - very irritated and red.  Burns and itchy. Does have h/o shingles around L eye and this feels different, but she did receive zostavax (2012) after initial shingles episode.  Some dysphagia endorsed over last few months.  This happens only during breakfast with toast.  Very stressed over work and caring for boyfriend and another friend.  Stays very anxious. Last saw Psych Dr. Lattie Haw 09/2012, has f/u appointment 04/2013.  Wt Readings from Last 3 Encounters:  02/18/13 158 lb (71.668 kg)  01/22/13 163 lb 8 oz (74.163 kg)  01/01/13 162 lb (73.483 kg)   Past Medical History  Diagnosis Date  . Hypertension   . Coronary artery disease     CABG 1998  . Anxiety   . Diastolic dysfunction   . Hyperlipidemia   . PUD (peptic ulcer disease)   . Microcytic anemia   . Depression   . Diabetes mellitus     type 2, followed by endo.  . Diverticulosis   . Hemorrhoids   . History of heart attack   . Depression   . Irritable bowel syndrome   . Chronic back pain 03/2012    lumbar DDD with herniation - s/p L S1 transforaminal ESI (10/2012 Dr. Sharlet Salina)  . Varicose veins   . Cystocele   . Hx of echocardiogram     a. Echo 10/13: mild LVH, EF 55-60%, Gr 1 diast dysfn, PASP 32    Review of Systems Per HPI    Objective:   Physical Exam  Nursing note and vitals reviewed. Constitutional: She appears well-developed and well-nourished. No distress.  Musculoskeletal: She exhibits no edema.  Varicose veins throughout BLE  Skin: Rash noted.     Erythematous papular to microvesicular rash on right crease between abdominal pannus and thigh  Pruritic, burning and tender. No break in skin  Psychiatric:  Very anxious and tearful with discussion of stressors       Assessment & Plan:

## 2013-02-18 NOTE — Assessment & Plan Note (Signed)
Her anxiety is out of control today.  I have recommended she return to see Dr. Lattie Haw her psychiatrist to discuss med titration as she refuses to take another psychotropic without consulting psych. (I have recommended trial of SSRI/SNRI in past as well)

## 2013-02-18 NOTE — Patient Instructions (Signed)
This rash may be either shingles or yeast infection - we will treat for both with antiviral by mouth for 1 week, as well as antifungal cream twice daily for 2 weeks. I've sent both to the pharmacy. I've refilled your hydrocodone. I want you to return to see Dr. Allyn Kenner to discuss choking episodes with breakfast.

## 2013-02-25 ENCOUNTER — Ambulatory Visit (INDEPENDENT_AMBULATORY_CARE_PROVIDER_SITE_OTHER): Payer: Medicare Other | Admitting: Cardiovascular Disease

## 2013-02-25 ENCOUNTER — Encounter (INDEPENDENT_AMBULATORY_CARE_PROVIDER_SITE_OTHER): Payer: Self-pay

## 2013-02-25 ENCOUNTER — Encounter: Payer: Self-pay | Admitting: Cardiovascular Disease

## 2013-02-25 VITALS — BP 119/62 | HR 73 | Ht <= 58 in | Wt 157.1 lb

## 2013-02-25 DIAGNOSIS — I2510 Atherosclerotic heart disease of native coronary artery without angina pectoris: Secondary | ICD-10-CM

## 2013-02-25 DIAGNOSIS — E785 Hyperlipidemia, unspecified: Secondary | ICD-10-CM | POA: Diagnosis not present

## 2013-02-25 NOTE — Assessment & Plan Note (Signed)
No angina Stable.

## 2013-02-25 NOTE — Progress Notes (Signed)
Christopher Date of Birth  April 19, 1927 [facility] [address]    [place], [place]  [address] [phone]  Fax  [phone]  History of Present Illness:  [name] is an 79 yo female with a Hx of CAD, CABG ( November 09, 1994), diabetes mellitus, anxiety, HTN, hyperlipidemia.   She's been under a lot of stress recently. Her son died last year. She's under a lot of stress with the extra expenses involved such as his funeral. She presented with some CP in December.  A stress myoview revealed lateral ischemia.  We offered cardiac cath but she did not want to have the cath. She presents today without any particular complaints. She has not had any episodes of chest pain or shortness of breath since I saw her a month ago. She does have occasional episodes of tachycardia. She also notes that she has some anxiety. She was last seen in January after having an abnormal stress test that revealed anterior apical ischemia. She refused cardiac catheterization at that time. She has not had any recent episodes of chest pain.  She does not want to have a cardiac catheterization since she's feeling so well.  She has had some chest burning that is nearly constant.  I suggest several times that she should consider getting the cath done and she changed to subject.  September 04, 2012:  Velta is doing well from a cardiac standpoint.  She is still working - despite having lots of back pain.  No CP .  She had some CP several years ago and had a mildly abnormal myoview.  She did not want to have a cardiac cath at that time and has not had any significant epidoses of CP since that time.  She continues to be under lots of stress  She complains of back pain, stomach pain, burning in her legs.    Nov. 24, 2014:  Pt is doing OK from a cardiac standpoint.  Lots of stress - she has been running the [employer] by herself for the past several months.  She is not getting enough rest.  Sleeping 4 1/2  hours at night - tries to get a nap for an hour.    Current Outpatient Prescriptions on File Prior to Visit  Medication Sig Dispense Refill  . ALPRAZolam (XANAX) 1 MG tablet Take 0.5 mg by mouth 4 (four) times daily. For anxiety      . Ascorbic Acid (VITAMIN C PO) Take 1,000 mg by mouth daily.       Marland Kitchen aspirin 81 MG tablet Take 81 mg by mouth daily.       Marland Kitchen CALCIUM PO Take 600 mg by mouth daily.       . clotrimazole (LOTRIMIN) 1 % cream Apply 1 application topically 2 (two) times daily. For a minimum of 2 weeks  30 g  0  . Cyanocobalamin (VITAMIN B 12 PO) Take 1 tablet by mouth daily.      Marland Kitchen DEXILANT 30 MG capsule TAKE 1 CAPSULE BY MOUTH DAILY  30 capsule  11  . estradiol (ESTRACE) 0.5 MG tablet TAKE 1 TABLET BY MOUTH DAILY  30 tablet  3  . glucosamine-chondroitin 500-400 MG tablet Take 1 tablet by mouth daily.        Marland Kitchen HYDROcodone-acetaminophen (NORCO/VICODIN) 5-325 MG per tablet TAKE 1/2 TABLET BY MOUTH EVERY 6 HOURS AS NEEDED  60 tablet  0  . Multiple Vitamin (MULTIVITAMIN) tablet Take 1 tablet by mouth  daily.        . naproxen sodium (ANAPROX) 220 MG tablet Take 220 mg by mouth at bedtime.      . nitroGLYCERIN (NITROSTAT) 0.4 MG SL tablet Place 0.4 mg under the tongue every 5 (five) minutes as needed. For chest pain May take up to 3 doses, if pain persists call 911.      . nystatin-triamcinolone ointment (MYCOLOG) APPLY TO AFFECTED AREA AS NEEDED  60 g  2  . pioglitazone (ACTOS) 45 MG tablet Take 1 tablet (45 mg total) by mouth daily.  30 tablet  5  . pravastatin (PRAVACHOL) 20 MG tablet TAKE 1 TABLET BY MOUTH DAILY.  30 tablet  11  . Probiotic Product (ALIGN) 4 MG CAPS Take 1 capsule by mouth daily.  90 capsule  3  . ramipril (ALTACE) 10 MG capsule TAKE 1 CAPSULE BY MOUTH EVERY DAY  30 capsule  5  . traZODone (DESYREL) 150 MG tablet Take 150 mg by mouth at bedtime.        . triamcinolone cream (KENALOG) 0.1 % Apply topically 2 (two) times daily. Apply to AA.  30 g  0  .  triamterene-hydrochlorothiazide (MAXZIDE-25) 37.5-25 MG per tablet       . valACYclovir (VALTREX) 500 MG tablet Take 2 tablets (1,000 mg total) by mouth 2 (two) times daily.  28 tablet  0  . VITAMIN E PO Take 1 tablet by mouth daily.        No current facility-administered medications on file prior to visit.    Allergies  Allergen Reactions  . Amoxicillin-Pot Clavulanate Diarrhea  . Metformin And Related Diarrhea    Past Medical History  Diagnosis Date  . Hypertension   . Coronary artery disease     CABG 1998  . Anxiety   . Diastolic dysfunction   . Hyperlipidemia   . PUD (peptic ulcer disease)   . Microcytic anemia   . Depression   . Diabetes mellitus     type 2, followed by endo.  . Diverticulosis   . Hemorrhoids   . History of heart attack   . Depression   . Irritable bowel syndrome   . Chronic back pain 03/2012    lumbar DDD with herniation - s/p L S1 transforaminal ESI (10/2012 Dr. Sharlet Salina)  . Varicose veins   . Cystocele   . Hx of echocardiogram     a. Echo 10/13: mild LVH, EF 55-60%, Gr 1 diast dysfn, PASP 32    Past Surgical History  Procedure Laterality Date  . Tympanoplasty  1960s    R side  . Coronary artery bypass graft  1998  . Cataract surgery    . Cholecystectomy    . Appendectomy      per pt report  . Colonoscopy  02/2010    extensive diverticulosis throughout, small internal hemorrhoids, rec rpt 5 yrs (Dr. Allyn Kenner)  . Hand surgery    . Lexiscan myoview  03/2011    Small basal inferolateral and anterolateral reversible perfusion defect suggests ischemia.  EF was normal.   old infarct, no new ischemia  . Toe surgery    . Abdominal hysterectomy  1975    TAH,BSO  . Vault prolapse a & p repair  2009    Dr [facility]  . Dexa scan  09/2011    femur -0.8, forearm 0.6 => normal  . Esi  2014    L S1 transforaminal ESI x2 ([facility])    History  Smoking status  .  Former Smoker  Smokeless tobacco  . Never Used    History  Alcohol  Use No    Family History  Problem Relation Age of Onset  . Hypertension Mother   . Stroke Mother   . Diabetes Father   . Diabetes Sister   . Diabetes Brother   . Colon cancer Neg Hx     Reviw of Systems:  Reviewed in the HPI.  All other systems are negative.  Physical Exam: BP 119/62  Pulse 73  Ht 4\' 9"  (1.448 m)  Wt 157 lb 1.9 oz (71.269 kg)  BMI 33.99 kg/m2 The patient is alert and oriented x 3.  The mood and affect are normal.   Skin: warm and dry.  Color is normal.    HEENT:   Normocephalic/atraumatic. Neck is supple. Her mucous membranes are moist. The JVD. Her carotids are normal.  Lungs: Lung exam is clear.   She has a slight reddish raskhon her upper chest.    Heart: Regular rate S1-S2. She has no murmurs.    Abdomen: Abdomen soft.  Extremities:  No clubbing cyanosis or edema  Neuro:  Exam is nonfocal.    ECG: September 04, 2012:  NSR . Poor R wave progression.   Assessment / Plan:

## 2013-02-25 NOTE — Assessment & Plan Note (Signed)
Will check lipids with office visit in 6 months.

## 2013-02-25 NOTE — Patient Instructions (Signed)
Your physician wants you to follow-up in: 6 months  You will receive a reminder letter in the mail two months in advance. If you don't receive a letter, please call our office to schedule the follow-up appointment.   Your physician recommends that you return for a FASTING lipid profile: 6 months    Your physician recommends that you continue on your current medications as directed. Please refer to the Current Medication list given to you today.

## 2013-03-07 ENCOUNTER — Other Ambulatory Visit: Payer: Self-pay | Admitting: Family Medicine

## 2013-03-08 ENCOUNTER — Telehealth: Payer: Self-pay

## 2013-03-08 NOTE — Telephone Encounter (Signed)
Patient notified. PA form in your IN box for completion.

## 2013-03-08 NOTE — Telephone Encounter (Signed)
plz notify we will fill out PA form to see if this will be covered by insurance.  We do not have coupons or samples of dexilant but pt can try nexium 40mg  daily. plz obtain PA for [place] to fill out.

## 2013-03-08 NOTE — Telephone Encounter (Signed)
Pt said pharmacist at [facility] told pt dexilant cost to pt was $180. Pt request different medication that is less expensive if Dr Darnell Level thinks possible. Pt has done well on dexilant and wants to know if any samples are available. Please advise. Pt request cb.

## 2013-03-11 NOTE — Telephone Encounter (Signed)
PA form faxed. Will await determination.

## 2013-03-11 NOTE — Telephone Encounter (Signed)
Filled out and placed in Kim's box. 

## 2013-03-22 ENCOUNTER — Other Ambulatory Visit: Payer: Self-pay

## 2013-03-22 MED ORDER — HYDROCODONE-ACETAMINOPHEN 5-325 MG PO TABS: ORAL_TABLET | ORAL | Status: DC

## 2013-03-22 NOTE — Telephone Encounter (Signed)
Attempted to call patient numerous times. Home phone busy on multiple attempts. Work # had no answer on multiple attempts. Will try again later.

## 2013-03-22 NOTE — Telephone Encounter (Signed)
Printed and placed in Kim's box. 

## 2013-03-22 NOTE — Telephone Encounter (Signed)
Patient notified and Rx placed up front for pick up. 

## 2013-03-22 NOTE — Telephone Encounter (Signed)
Pt left v/m requesting rx hydrocodone apap. Pt request to pick up rx today.Please advise.

## 2013-04-04 HISTORY — PX: CARDIOVASCULAR STRESS TEST: SHX262

## 2013-04-08 ENCOUNTER — Telehealth: Payer: Self-pay | Admitting: Family Medicine

## 2013-04-08 ENCOUNTER — Ambulatory Visit: Payer: Medicare Other | Admitting: Endocrinology

## 2013-04-08 DIAGNOSIS — Z0289 Encounter for other administrative examinations: Secondary | ICD-10-CM

## 2013-04-08 NOTE — Telephone Encounter (Signed)
Patient Information:  Caller Name: Yoshida  Phone: [phone]  Patient: [name]  Gender: Female  DOB: 05-Aug-1927  Age: 80 Years  PCP: Ria [facility])  Office Follow Up:  Does the office need to follow up with this patient?: No  Instructions For The Office: N/A  RN Note:  Pt reports dizziness that began today. She is able to carry out her ADLs. Also a dry cough w/clear sinus drng.  Symptoms  Reason For Call & Symptoms: Runny nose, dizziness  Reviewed Health History In EMR: Yes  Reviewed Medications In EMR: Yes  Reviewed Allergies In EMR: Yes  Reviewed Surgeries / Procedures: Yes  Date of Onset of Symptoms: 04/04/2013  Guideline(s) Used:  Cough  Dizziness  Disposition Per Guideline:   Go to Office Now  Reason For Disposition Reached:   Lightheadedness (dizziness) present now, after 2 hours of rest and fluids  Advice Given:  Temporary Dizziness  is usually a harmless symptom. It can be caused by not drinking enough water during sports or hot weather. It can also be caused by skipping a meal, too much sun exposure, standing up suddenly, standing too long in one place or even a viral illness.  Some Causes of Temporary Dizziness:  Poor Fluid Intake - Not drinking enough fluids and being a Marcelli dehydrated is a common cause of temporary dizziness. This is always worse during hot weather.  Standing Up Suddenly - Standing up suddenly (especially getting out of bed) or prolonged standing in one place are common causes of temporary dizziness. Not drinking enough fluids always makes it worse. Certain medications can cause or increase this type of dizziness (e.g., blood pressure medications).  Rest for 1-2 Hours:  Lie down with feet elevated for 1 hour. This will improve blood flow and increase blood flow to the brain.  Stand Up Slowly:  In the mornings, sit up for a few minutes before you stand up. That will help your blood flow make the adjustment.  Sit  down or lie down if you feel dizzy.  Reassurance  Coughing is the way that our lungs remove irritants and mucus. It helps protect our lungs from getting pneumonia.  You can also get a cough after being exposed to irritating substances like smoke, strong perfumes, and dust.  Cough Medicines:  OTC Cough Syrups: The most common cough suppressant in OTC cough medications is dextromethorphan. Often the letters "DM" appear in the name.  Cough Medicines:  OTC Cough Syrups: The most common cough suppressant in OTC cough medications is dextromethorphan. Often the letters "DM" appear in the name.  OTC Cough Drops: Cough drops can help a lot, especially for mild coughs. They reduce coughing by soothing your irritated throat and removing that tickle sensation in the back of the throat. Cough drops also have the advantage of portability - you can carry them with you.  Home Remedy - Hard Candy: Hard candy works just as well as medicine-flavored OTC cough drops. Diabetics should use sugar-free candy.  Coughing Spasms:  Drink warm fluids. Inhale warm mist (Reason: both relax the airway and loosen up the phlegm).  Prevent Dehydration:  Drink adequate liquids.  Reassurance  It sounds like an uncomplicated cold that we can treat at home.  Colds are very common and may make you feel uncomfortable.  Colds are caused by viruses, and no medicine or "shot" will cure an uncomplicated cold.  Colds are usually not serious.  Patient Refused Recommendation:  Patient Will Follow Up  With Office Later  An appt is offered and declined.

## 2013-04-09 ENCOUNTER — Ambulatory Visit: Payer: Medicare Other | Admitting: Endocrinology

## 2013-04-11 ENCOUNTER — Telehealth: Payer: Self-pay | Admitting: Family Medicine

## 2013-04-11 NOTE — Telephone Encounter (Signed)
Patient Information:  Caller Name: Tysheena  Phone: [phone]  Patient: [name]  Gender: Female  DOB: 26-Nov-1927  Age: 80 Years  PCP: [facility])  Office Follow Up:  Does the office need to follow up with this patient?: Yes  Instructions For The Office: Appts appear full for today. Pt needs to be seen today per triage. Can she be worked in? or should she go to UC? Please f/u with pt today. Thank you .  RN Note:  Will make appt.  Symptoms  Reason For Call & Symptoms: Cough, green nasal drainage, headache x 1 week.  Reviewed Health History In EMR: Yes  Reviewed Medications In EMR: Yes  Reviewed Allergies In EMR: Yes  Reviewed Surgeries / Procedures: Yes  Date of Onset of Symptoms: 04/04/2013  Treatments Tried: Mucinex, cough drops  Treatments Tried Worked: No  Guideline(s) Used:  Colds  Disposition Per Guideline:   See Today in Office  Reason For Disposition Reached:   Earache  Advice Given:  Call Back If:  You become worse  Patient Will Follow Care Advice:  YES

## 2013-04-11 NOTE — Telephone Encounter (Signed)
Patient has appt tomorrow

## 2013-04-12 ENCOUNTER — Ambulatory Visit (INDEPENDENT_AMBULATORY_CARE_PROVIDER_SITE_OTHER): Payer: Medicare Other | Admitting: Family Medicine

## 2013-04-12 ENCOUNTER — Encounter: Payer: Self-pay | Admitting: Family Medicine

## 2013-04-12 VITALS — BP 136/74 | HR 72 | Temp 98.4°F | Wt 157.8 lb

## 2013-04-12 DIAGNOSIS — J209 Acute bronchitis, unspecified: Secondary | ICD-10-CM

## 2013-04-12 MED ORDER — AZITHROMYCIN 250 MG PO TABS: ORAL_TABLET | ORAL | Status: DC

## 2013-04-12 NOTE — Patient Instructions (Signed)
I think you have bronchitis - treat with zpack sent into pharmacy Push lots of fluids and lots of rest. (a vacation would be beneficial) If worsening productive cough, fever >101 or not improving as expected, return to see me.

## 2013-04-12 NOTE — Progress Notes (Signed)
Pre-visit discussion using our clinic review tool. No additional management support is needed unless otherwise documented below in the visit note.  

## 2013-04-12 NOTE — Progress Notes (Signed)
   Subjective:    [name], female    DOB: 18-Nov-1927, 80 y.o.   MRN: [id number]  HPI CC: cold sxs  10d h/o cold sxs - rhinorrhea, cough, headache.  Out of work Sunday and Monday.  Did have chills 1 day.  Cough productive of yellow green mucous.  Ears feels stopped up.  Head > chest congestion.  No fevers, diarrhea, tooth pain, ST.  Has been using mucinex and corcedin. Did receive flu shot this year. Not around sick contacts. Exposed to second hand smoke. No known h/o COPD or asthma.  Past Medical History  Diagnosis Date  . Hypertension   . Coronary artery disease     CABG 1998  . Anxiety   . Diastolic dysfunction   . Hyperlipidemia   . PUD (peptic ulcer disease)   . Microcytic anemia   . Depression   . Diabetes mellitus     type 2, followed by endo.  . Diverticulosis   . Hemorrhoids   . History of heart attack   . Depression   . Irritable bowel syndrome   . Chronic back pain 03/2012    lumbar DDD with herniation - s/p L S1 transforaminal ESI (10/2012 Dr. Sharlet Salina)  . Varicose veins   . Cystocele   . Hx of echocardiogram     a. Echo 10/13: mild LVH, EF 55-60%, Gr 1 diast dysfn, PASP 32     Review of Systems Per HPI    Objective:   Physical Exam  Nursing note and vitals reviewed. Constitutional: She appears well-developed and well-nourished. No distress.  HENT:  Head: Normocephalic and atraumatic.  Right Ear: External ear normal.  Left Ear: External ear normal.  Nose: No mucosal edema or rhinorrhea. Right sinus exhibits no maxillary sinus tenderness and no frontal sinus tenderness. Left sinus exhibits no maxillary sinus tenderness and no frontal sinus tenderness.  Mouth/Throat: Uvula is midline, oropharynx is clear and moist and mucous membranes are normal. No oropharyngeal exudate.  Eyes: Conjunctivae and EOM are normal. Pupils are equal, round, and reactive to light. No scleral icterus.  Neck: Normal range of motion. Neck supple.    Cardiovascular: Normal rate, regular rhythm, normal heart sounds and intact distal pulses.   No murmur heard. Pulmonary/Chest: Effort normal. No respiratory distress. She has no decreased breath sounds. She has wheezes (faint exp wheezing right lung fields). She has rhonchi (scattered on right). She has no rales.  Musculoskeletal: She exhibits no edema.  Lymphadenopathy:    She has no cervical adenopathy.  Skin: Skin is warm and dry. No rash noted.       Assessment & Plan:

## 2013-04-12 NOTE — Assessment & Plan Note (Signed)
Anticipate acute bronchitis given exam findings and story Treat with zpack as well as supportive care per instructions. Red flags to return discussed.

## 2013-04-16 ENCOUNTER — Ambulatory Visit (INDEPENDENT_AMBULATORY_CARE_PROVIDER_SITE_OTHER): Payer: Medicare Other | Admitting: Family Medicine

## 2013-04-16 ENCOUNTER — Encounter: Payer: Self-pay | Admitting: Family Medicine

## 2013-04-16 VITALS — BP 128/64 | HR 84 | Temp 98.2°F | Wt 158.0 lb

## 2013-04-16 DIAGNOSIS — H6120 Impacted cerumen, unspecified ear: Secondary | ICD-10-CM | POA: Diagnosis not present

## 2013-04-16 DIAGNOSIS — R234 Changes in skin texture: Secondary | ICD-10-CM | POA: Insufficient documentation

## 2013-04-16 DIAGNOSIS — J209 Acute bronchitis, unspecified: Secondary | ICD-10-CM

## 2013-04-16 HISTORY — DX: Impacted cerumen, unspecified ear: H61.20

## 2013-04-16 NOTE — Progress Notes (Signed)
   Subjective:    [name], female    DOB: [birth year], 80 y.o.   MRN: [id number]  HPI CC: 2 mo f/u  [name] presents today for 2 month follow up but actually wants to be rechecked after recent bronchitis as she's not fully feeling better. Seen last week with acute bronchitis and treated with zpack (forgot to take 2 pills on first day but has been otherwise compliant).  Cough seems to be improving.  Some headache and sinus congestion /drainage.   Energy level staying down - frustrated because unable to do chores at home and work. No fevers/chills. Did take 2 days off over weekend which is very unusual for her.  Has been using mucinex and corcedin.  Did receive flu shot this year.  Not around sick contacts.  Exposed to second hand smoke.  No known h/o COPD or asthma.  Also has residual scab in R lateral lower leg present for several months.  Review of Systems Per HPI    Objective:   Physical Exam  Nursing note and vitals reviewed. Constitutional: She appears well-developed and well-nourished. No distress.  HENT:  Head: Normocephalic and atraumatic.  Right Ear: Hearing, external ear and ear canal normal.  Left Ear: Hearing, external ear and ear canal normal.  Nose: Mucosal edema present. No rhinorrhea. Right sinus exhibits no maxillary sinus tenderness and no frontal sinus tenderness. Left sinus exhibits no maxillary sinus tenderness and no frontal sinus tenderness.  Mouth/Throat: Uvula is midline, oropharynx is clear and moist and mucous membranes are normal. No oropharyngeal exudate, posterior oropharyngeal edema, posterior oropharyngeal erythema or tonsillar abscesses.  Cerumen impaction bilaterally  Eyes: Conjunctivae and EOM are normal. Pupils are equal, round, and reactive to light. No scleral icterus.  Neck: Normal range of motion. Neck supple.  Cardiovascular: Normal rate, regular rhythm, normal heart sounds and intact distal pulses.   No murmur  heard. Pulmonary/Chest: Effort normal and breath sounds normal. No respiratory distress. She has no wheezes. She has no rales.  clear  Lymphadenopathy:    She has no cervical adenopathy.  Skin: Skin is warm and dry. No rash noted.  Scab R lat leg with faint surrounding erythema       Assessment & Plan:

## 2013-04-16 NOTE — Assessment & Plan Note (Signed)
Present for 1+ month, advised start either regular moisturizing with aveeno or eucerin or vaseline and will recheck next visit.  Pt agrees.

## 2013-04-16 NOTE — Patient Instructions (Signed)
We will clean out the ears today with irrigation. Start nasal saline irrigation throughout the day. Start plain mucinex or immediate release guaifenesin for sinus congestion.  Take this with plenty of water to mobilize mucous. Return at your convenience fasting for blood work and afterwards medicare wellness visit.

## 2013-04-16 NOTE — Assessment & Plan Note (Signed)
acute bronchitis resolving well - now with persistent sinus congestion.  Reassured, advised start regular nasal saline irrigation.

## 2013-04-16 NOTE — Assessment & Plan Note (Signed)
Bilateral - irrigation performed today. Successful on right, however left side still had some hard wax present after irrigation.  Advised tylenol if discomfort and rtc in 3-5 days to re irrigate left side. Pt agrees with plan.

## 2013-04-16 NOTE — Progress Notes (Signed)
Pre-visit discussion using our clinic review tool. No additional management support is needed unless otherwise documented below in the visit note.  

## 2013-04-22 ENCOUNTER — Encounter: Payer: Self-pay | Admitting: Family Medicine

## 2013-04-22 ENCOUNTER — Ambulatory Visit (INDEPENDENT_AMBULATORY_CARE_PROVIDER_SITE_OTHER): Payer: Medicare Other | Admitting: Family Medicine

## 2013-04-22 VITALS — BP 138/68 | HR 84 | Temp 97.9°F | Wt 156.0 lb

## 2013-04-22 DIAGNOSIS — H6120 Impacted cerumen, unspecified ear: Secondary | ICD-10-CM | POA: Diagnosis not present

## 2013-04-22 NOTE — Progress Notes (Signed)
   Subjective:    [name], female    DOB: 08-06-1927, 80 y.o.   MRN: [id number]  HPI CC: continue cerumen irrigation  Pt presents today to complete left sided cerumen irrigation started last week.  At that time, successful on right, incomplete on left.  Mild residual irritation for 2 days after initial irrigation at right side. No left ear pain endorsed or difficulty hearing.  Review of Systems per HPI    Objective:   Physical Exam  Nursing note and vitals reviewed. Constitutional: She appears well-developed and well-nourished. No distress.  HENT:  Right Ear: Hearing, tympanic membrane, external ear and ear canal normal.  Left Ear: Hearing, external ear and ear canal normal.  Fluid behind L TM       Assessment & Plan:

## 2013-04-22 NOTE — Assessment & Plan Note (Signed)
Successful L sided cerumen disimpaction.

## 2013-04-22 NOTE — Patient Instructions (Signed)
Good to see you - left ear fully cleaned today.  Let us know if questions. Return for previously scheduled wellness exam

## 2013-04-22 NOTE — Progress Notes (Signed)
Pre-visit discussion using our clinic review tool. No additional management support is needed unless otherwise documented below in the visit note.  

## 2013-04-26 ENCOUNTER — Other Ambulatory Visit: Payer: Self-pay | Admitting: Family Medicine

## 2013-04-26 NOTE — Telephone Encounter (Signed)
Rx called in as directed.   

## 2013-04-26 NOTE — Telephone Encounter (Signed)
plz phone in. 

## 2013-04-29 ENCOUNTER — Other Ambulatory Visit: Payer: Self-pay

## 2013-04-29 DIAGNOSIS — F411 Generalized anxiety disorder: Secondary | ICD-10-CM | POA: Diagnosis not present

## 2013-04-29 DIAGNOSIS — F429 Obsessive-compulsive disorder, unspecified: Secondary | ICD-10-CM | POA: Diagnosis not present

## 2013-04-29 MED ORDER — HYDROCODONE-ACETAMINOPHEN 5-325 MG PO TABS: ORAL_TABLET | ORAL | Status: DC

## 2013-04-29 NOTE — Telephone Encounter (Signed)
Pt left note requesting rx hydrocodone apap. Call when ready for pick up.

## 2013-04-29 NOTE — Telephone Encounter (Signed)
Printed and placed in Kim's box. 

## 2013-04-29 NOTE — Telephone Encounter (Signed)
Patient notified and said she had already taken her last one. Since we had closed for the day, I told her I would drop Rx [facility] for her on my way home for her to p/u later tonight. Rx taken to [facility] by myself.

## 2013-05-08 ENCOUNTER — Other Ambulatory Visit: Payer: Self-pay | Admitting: Family Medicine

## 2013-05-13 ENCOUNTER — Other Ambulatory Visit: Payer: Self-pay | Admitting: Family Medicine

## 2013-05-24 ENCOUNTER — Other Ambulatory Visit: Payer: Self-pay

## 2013-05-24 MED ORDER — HYDROCODONE-ACETAMINOPHEN 5-325 MG PO TABS: ORAL_TABLET | ORAL | Status: DC

## 2013-05-24 NOTE — Telephone Encounter (Signed)
Pt left v/m requesting rx hydrocodone apap. Call when ready for pick up.  

## 2013-05-24 NOTE — Telephone Encounter (Signed)
Printed and placed in Kim's box. 

## 2013-05-27 ENCOUNTER — Ambulatory Visit (INDEPENDENT_AMBULATORY_CARE_PROVIDER_SITE_OTHER): Payer: Medicare Other | Admitting: Family Medicine

## 2013-05-27 ENCOUNTER — Encounter: Payer: Self-pay | Admitting: Family Medicine

## 2013-05-27 VITALS — BP 128/76 | HR 72 | Temp 98.1°F | Wt 157.5 lb

## 2013-05-27 DIAGNOSIS — R21 Rash and other nonspecific skin eruption: Secondary | ICD-10-CM | POA: Diagnosis not present

## 2013-05-27 MED ORDER — TRIAMCINOLONE ACETONIDE 0.1 % EX CREA: TOPICAL_CREAM | Freq: Two times a day (BID) | CUTANEOUS | Status: DC

## 2013-05-27 NOTE — Telephone Encounter (Signed)
Patient notified and will pick up Rx at appt this afternoon.

## 2013-05-27 NOTE — Patient Instructions (Signed)
I think this is mostly coming from venous insufficiency of lower legs - use triamcinolone cream to both legs at rash that is itchy. Artificial skin onto right lateral leg wound.  Keep in place for 1 week or until it falls off. Good to see you today, call us with questions.

## 2013-05-27 NOTE — Progress Notes (Signed)
Pre visit review using our clinic review tool, if applicable. No additional management support is needed unless otherwise documented below in the visit note. 

## 2013-05-27 NOTE — Assessment & Plan Note (Addendum)
Anticipate majority of sxs coming from chronic venous insufficiency.  Pt cannot tolerate higher compression than the pressure from normal store bought stockings. Treat with TCI cream to rash. Anticipate some component of xerosis as well chronic venous insuff changes. Placed artificial skin (duoderm) onto right lateral shallow ulcer.  Keep in place for 1 week.

## 2013-05-27 NOTE — Progress Notes (Addendum)
BP 128/76  Pulse 72  Temp(Src) 98.1 F (36.7 C) (Oral)  Wt 157 lb 8 oz (71.442 kg)   CC: R leg wound  Subjective:    [name], female    DOB: 25-Dec-1927, 80 y.o.   MRN: [id number]  HPI: [name] is a 80 y.o. female presenting on 05/27/2013 with Wound Check   Check leg spots.  R medial leg with spots.  Bilateral legs with small papules.  R lateral leg with small open sore that she has been treating with vaseline.  This wound occurred 2 months ago, right before christmas.  She has been using vaseline cream on legs.  ABI 08/2011 - R 0.91, L 0.83 - mild to mod arterial insufficiency.  Relevant past medical, surgical, family and social history reviewed and updated as indicated.  Allergies and medications reviewed and updated. Current Outpatient Prescriptions on File Prior to Visit  Medication Sig  . ALPRAZolam (XANAX) 1 MG tablet TAKE 1/2 TABLET BY MOUTH 4 TIMES A DAY  . Ascorbic Acid (VITAMIN C PO) Take 1,000 mg by mouth daily.   Marland Kitchen aspirin 81 MG tablet Take 81 mg by mouth daily.   Marland Kitchen CALCIUM PO Take 600 mg by mouth daily.   . clotrimazole (LOTRIMIN) 1 % cream APPLY 2 (TWO) TIMES DAILY. FOR A MINIMUM OF 2 WEEKS  . Cyanocobalamin (VITAMIN B 12 PO) Take 1 tablet by mouth daily.  Marland Kitchen DEXILANT 30 MG capsule TAKE 1 CAPSULE BY MOUTH DAILY  . estradiol (ESTRACE) 0.5 MG tablet TAKE 1 TABLET BY MOUTH DAILY  . glucosamine-chondroitin 500-400 MG tablet Take 1 tablet by mouth daily.    Marland Kitchen HYDROcodone-acetaminophen (NORCO/VICODIN) 5-325 MG per tablet TAKE 1/2 TABLET BY MOUTH EVERY 6 HOURS AS NEEDED  . Multiple Vitamin (MULTIVITAMIN) tablet Take 1 tablet by mouth daily.    . nitroGLYCERIN (NITROSTAT) 0.4 MG SL tablet Place 0.4 mg under the tongue every 5 (five) minutes as needed. For chest pain May take up to 3 doses, if pain persists call 911.  . pioglitazone (ACTOS) 45 MG tablet Take 1 tablet (45 mg total) by mouth daily.  . pravastatin (PRAVACHOL) 20 MG tablet TAKE 1  TABLET BY MOUTH DAILY.  . Probiotic Product (ALIGN) 4 MG CAPS Take 1 capsule by mouth daily.  . ramipril (ALTACE) 10 MG capsule TAKE 1 CAPSULE BY MOUTH EVERY DAY  . traZODone (DESYREL) 150 MG tablet Take 150 mg by mouth at bedtime.    Marland Kitchen VITAMIN E PO Take 1 tablet by mouth daily.   . naproxen sodium (ANAPROX) 220 MG tablet Take 220 mg by mouth at bedtime.  Marland Kitchen nystatin-triamcinolone ointment (MYCOLOG) APPLY TO AFFECTED AREA AS NEEDED  . triamterene-hydrochlorothiazide (MAXZIDE-25) 37.5-25 MG per tablet   . valACYclovir (VALTREX) 500 MG tablet Take 2 tablets (1,000 mg total) by mouth 2 (two) times daily.   No current facility-administered medications on file prior to visit.    Review of Systems Per HPI unless specifically indicated above    Objective:    BP 128/76  Pulse 72  Temp(Src) 98.1 F (36.7 C) (Oral)  Wt 157 lb 8 oz (71.442 kg)  Physical Exam  Nursing note and vitals reviewed. Constitutional: She appears well-developed and well-nourished. No distress.  Musculoskeletal: She exhibits no edema.  Bilateral varicose veins with reticulated veins at ankles. R medial leg with nonblanching ecchymosis. R lower medial ankle with blanching erythematous maculse R lateral leg with small 48mm chronic shallow ulcer without surrounding erythema.  1+ DP bilaterally  Skin: Skin is warm and dry. Rash noted.       Assessment & Plan:   Problem List Items Addressed This Visit   Skin rash - Primary     Anticipate majority of sxs coming from chronic venous insufficiency.  Pt cannot tolerate higher compression than the pressure from normal store bought stockings. Treat with TCI cream to rash. Anticipate some component of xerosis as well chronic venous insuff changes. Placed artificial skin (duoderm) onto right lateral shallow ulcer.  Keep in place for 1 week.        Follow up plan: Return if symptoms worsen or fail to improve.

## 2013-05-31 ENCOUNTER — Other Ambulatory Visit: Payer: Self-pay | Admitting: Family Medicine

## 2013-06-10 ENCOUNTER — Other Ambulatory Visit: Payer: Medicare Other

## 2013-06-12 LAB — HM DIABETES EYE EXAM

## 2013-06-13 DIAGNOSIS — H353190 Nonexudative age-related macular degeneration, unspecified eye, stage unspecified: Secondary | ICD-10-CM | POA: Diagnosis not present

## 2013-06-17 ENCOUNTER — Ambulatory Visit (INDEPENDENT_AMBULATORY_CARE_PROVIDER_SITE_OTHER): Payer: Medicare Other | Admitting: Family Medicine

## 2013-06-17 ENCOUNTER — Encounter: Payer: Self-pay | Admitting: Family Medicine

## 2013-06-17 VITALS — BP 118/74 | HR 84 | Temp 98.0°F | Ht <= 58 in | Wt 156.0 lb

## 2013-06-17 DIAGNOSIS — IMO0001 Reserved for inherently not codable concepts without codable children: Secondary | ICD-10-CM

## 2013-06-17 DIAGNOSIS — J3489 Other specified disorders of nose and nasal sinuses: Secondary | ICD-10-CM

## 2013-06-17 DIAGNOSIS — IMO0002 Reserved for concepts with insufficient information to code with codable children: Secondary | ICD-10-CM

## 2013-06-17 DIAGNOSIS — Z0000 Encounter for general adult medical examination without abnormal findings: Secondary | ICD-10-CM | POA: Diagnosis not present

## 2013-06-17 DIAGNOSIS — Z780 Asymptomatic menopausal state: Secondary | ICD-10-CM | POA: Diagnosis not present

## 2013-06-17 DIAGNOSIS — I10 Essential (primary) hypertension: Secondary | ICD-10-CM

## 2013-06-17 DIAGNOSIS — R0981 Nasal congestion: Secondary | ICD-10-CM | POA: Insufficient documentation

## 2013-06-17 DIAGNOSIS — Z23 Encounter for immunization: Secondary | ICD-10-CM | POA: Diagnosis not present

## 2013-06-17 DIAGNOSIS — E1165 Type 2 diabetes mellitus with hyperglycemia: Secondary | ICD-10-CM

## 2013-06-17 MED ORDER — HYDROCODONE-ACETAMINOPHEN 5-325 MG PO TABS: ORAL_TABLET | ORAL | Status: DC

## 2013-06-17 MED ORDER — FLUTICASONE PROPIONATE 50 MCG/ACT NA SUSP: 2.0000 | Freq: Every day | NASAL | Status: DC

## 2013-06-17 NOTE — Assessment & Plan Note (Signed)
I have personally reviewed the Medicare Annual Wellness questionnaire and have noted 1. The patient's medical and social history 2. Their use of alcohol, tobacco or illicit drugs 3. Their current medications and supplements 4. The patient's functional ability including ADL's, fall risks, home safety risks and hearing or visual impairment. 5. Diet and physical activity 6. Evidence for depression or mood disorders The patients weight, height, BMI have been recorded in the chart.  Hearing and vision has been addressed. I have made referrals, counseling and provided education to the patient based review of the above and I have provided the pt with a written personalized care plan for preventive services. See scanned questionairre. Advanced directives discussed: pt will bring me copy of living will and update if needed.  Son would be HCPOA.  Reviewed preventative protocols and updated unless pt declined. prevnar today. UTD other preventative healthcare.  Advised she consider scheduling mammogram.

## 2013-06-17 NOTE — Assessment & Plan Note (Signed)
rec start flonase.  Sent into pharmacy. No evidence of bacterial infection today.

## 2013-06-17 NOTE — Assessment & Plan Note (Signed)
Chronic, stable. Continue meds. 

## 2013-06-17 NOTE — Assessment & Plan Note (Signed)
Continue low dose HRT. Pt aware of risks of this.

## 2013-06-17 NOTE — Assessment & Plan Note (Signed)
I've asked her to schedule appt with Dr. Dwyane Dee as she's due for f/u. states normal eye exam recently.

## 2013-06-17 NOTE — Patient Instructions (Addendum)
Prevnar today (pneumonia shot). Call to reschedule appt with Dr. Dwyane Dee Think about mammogram. Bring me a copy of your living will to update your chart. Start flonase again for sinus congestion and watch for fever>101 or worsening. Good to see you today, call us with questions. Return to see me in 3-4 months for follow up

## 2013-06-17 NOTE — Progress Notes (Signed)
BP 118/74  Pulse 84  Temp(Src) 98 F (36.7 C) (Oral)  Ht 4\' 10"  (1.473 m)  Wt 156 lb (70.761 kg)  BMI 32.61 kg/m2   CC: medicare wellness visit  Subjective:    [name], female    DOB: 1928-02-03, 80 y.o.   MRN: [id number]  HPI: [name] is a 80 y.o. female presenting on 06/17/2013 for Annual Exam   [name] has not had medicare wellness visit with myself.  Stays stressed at work.  Works at [employer].  Some sinus congestion today, some R ear discomfort ongoing.  Did have cerumen disimpaction 2 months ago.  Postmenopausal, h/o severe hot flashes, longstanding on low dose estrogen estradiol 0.5mg  daily.  Tapers have caused unacceptable side effects and severe worsening of her anxiety.  Pt aware of risks of HRT.  Does not desire taper, does not desire transdermal estrogen.  Chronic lower back pain - ESI didn't help ([place]).  H/o DM - followed by Endo Dr. Dwyane Dee.  Recent vision exam with ophtho (06/12/2013 per patient and normal exam). Hearing screen failed at low frequencies.  Pt denies trouble with hearing and declines further hearing eval 1 fall with injury in past year - getting out of bed, tripped over walker.  Hit right hip, tender since then.  Has not responded well to other antidepressants PHQ9 = 7.  H/o severe anxiety with depression treated with xanax and trazodone 150mg  at bedtime.  Preventative: She recently saw Dr. Phineas Real for well woman exam.  Pap smear - aged out.  No h/o abnormal pap, s/p hysterectomy for benign reason.  Mammography 2012. Due for rpt.  Declines repeat currently.  Clinical breast exam done 12/2012 by GYN. Colonoscopy 02/2010 - Extensive diverticulosis throughout, small internal hemorrhoids, rec rpt 5 yrs (Dr. Allyn Kenner) Flu 12/2012 Pneumonia - unsure, thinks with Dr Willette Brace.  prevnar today. Tetanus - unsure zostavax 09/2010 DEXA 2013 normal. Consider five-year interval Advanced directives: has living will at home.  Son  "[name]" [name] would be [facility] proxy   Relevant past medical, surgical, family and social history reviewed and updated as indicated.  Allergies and medications reviewed and updated. Current Outpatient Prescriptions on File Prior to Visit  Medication Sig  . ALPRAZolam (XANAX) 1 MG tablet TAKE 1/2 TABLET BY MOUTH 4 TIMES A DAY  . Ascorbic Acid (VITAMIN C PO) Take 1,000 mg by mouth daily.   Marland Kitchen aspirin 81 MG tablet Take 81 mg by mouth daily.   Marland Kitchen CALCIUM PO Take 600 mg by mouth daily.   . clotrimazole (LOTRIMIN) 1 % cream APPLY 2 (TWO) TIMES DAILY. FOR A MINIMUM OF 2 WEEKS  . Cyanocobalamin (VITAMIN B 12 PO) Take 1 tablet by mouth daily.  Marland Kitchen DEXILANT 30 MG capsule TAKE 1 CAPSULE BY MOUTH DAILY  . estradiol (ESTRACE) 0.5 MG tablet TAKE 1 TABLET BY MOUTH DAILY  . glucosamine-chondroitin 500-400 MG tablet Take 1 tablet by mouth daily.    . naproxen sodium (ANAPROX) 220 MG tablet Take 220 mg by mouth at bedtime.  . nitroGLYCERIN (NITROSTAT) 0.4 MG SL tablet Place 0.4 mg under the tongue every 5 (five) minutes as needed. For chest pain May take up to 3 doses, if pain persists call 911.  . nystatin-triamcinolone ointment (MYCOLOG) APPLY TO AFFECTED AREA AS NEEDED  . pioglitazone (ACTOS) 45 MG tablet Take 1 tablet (45 mg total) by mouth daily.  . pravastatin (PRAVACHOL) 20 MG tablet TAKE 1 TABLET BY MOUTH DAILY.  Marland Kitchen  Probiotic Product (ALIGN) 4 MG CAPS Take 1 capsule by mouth daily.  . ramipril (ALTACE) 10 MG capsule TAKE 1 CAPSULE BY MOUTH EVERY DAY  . traZODone (DESYREL) 150 MG tablet Take 150 mg by mouth at bedtime.    . triamcinolone cream (KENALOG) 0.1 % Apply topically 2 (two) times daily. Apply to AA on legs.  . triamterene-hydrochlorothiazide (MAXZIDE-25) 37.5-25 MG per tablet   . valACYclovir (VALTREX) 500 MG tablet Take 2 tablets (1,000 mg total) by mouth 2 (two) times daily.  Marland Kitchen VITAMIN E PO Take 1 tablet by mouth daily.    No current facility-administered medications on file prior to  visit.    Review of Systems Per HPI unless specifically indicated above    Objective:    BP 118/74  Pulse 84  Temp(Src) 98 F (36.7 C) (Oral)  Ht 4\' 10"  (1.473 m)  Wt 156 lb (70.761 kg)  BMI 32.61 kg/m2  Physical Exam  Nursing note and vitals reviewed. Constitutional: She is oriented to person, place, and time. She appears well-developed and well-nourished. No distress.  HENT:  Head: Normocephalic and atraumatic.  Right Ear: Hearing, tympanic membrane, external ear and ear canal normal.  Left Ear: Hearing, tympanic membrane, external ear and ear canal normal.  Nose: Nose normal. No mucosal edema or rhinorrhea. Right sinus exhibits no maxillary sinus tenderness and no frontal sinus tenderness. Left sinus exhibits no maxillary sinus tenderness and no frontal sinus tenderness.  Mouth/Throat: Uvula is midline, oropharynx is clear and moist and mucous membranes are normal. No oropharyngeal exudate, posterior oropharyngeal edema, posterior oropharyngeal erythema or tonsillar abscesses.  Slight cerumen in R canal  Eyes: Conjunctivae and EOM are normal. Pupils are equal, round, and reactive to light. No scleral icterus.  Neck: Normal range of motion. Neck supple. Carotid bruit is not present. No thyromegaly present.  Cardiovascular: Normal rate, regular rhythm, normal heart sounds and intact distal pulses.   No murmur heard. Pulses:      Radial pulses are 2+ on the right side, and 2+ on the left side.  Pulmonary/Chest: Effort normal and breath sounds normal. No respiratory distress. She has no wheezes. She has no rales.  Abdominal: Soft. Bowel sounds are normal. She exhibits no distension and no mass. There is no tenderness. There is no rebound and no guarding.  Musculoskeletal: Normal range of motion. She exhibits no edema.  Lymphadenopathy:    She has no cervical adenopathy.  Neurological: She is alert and oriented to person, place, and time.  CN grossly intact, station and gait  intact 3/3 recall 4/5 calculation serial 7s  Skin: Skin is warm and dry. No rash noted.  Psychiatric: She has a normal mood and affect. Her behavior is normal. Judgment and thought content normal.       Assessment & Plan:   Problem List Items Addressed This Visit   Diabetes type 2, uncontrolled     I've asked her to schedule appt with Dr. Dwyane Dee as she's due for f/u. states normal eye exam recently.    Hypertension     Chronic, stable. Continue meds.    Medicare annual wellness visit, subsequent - Primary     I have personally reviewed the Medicare Annual Wellness questionnaire and have noted 1. The patient's medical and social history 2. Their use of alcohol, tobacco or illicit drugs 3. Their current medications and supplements 4. The patient's functional ability including ADL's, fall risks, home safety risks and hearing or visual impairment. 5. Diet and physical activity  6. Evidence for depression or mood disorders The patients weight, height, BMI have been recorded in the chart.  Hearing and vision has been addressed. I have made referrals, counseling and provided education to the patient based review of the above and I have provided the pt with a written personalized care plan for preventive services. See scanned questionairre. Advanced directives discussed: pt will bring me copy of living will and update if needed.  Son would be HCPOA.  Reviewed preventative protocols and updated unless pt declined. prevnar today. UTD other preventative healthcare.  Advised she consider scheduling mammogram.    Sinus congestion     rec start flonase.  Sent into pharmacy. No evidence of bacterial infection today.     Other Visit Diagnoses   Immunization due        Relevant Orders       Pneumococcal conjugate vaccine 13-valent (Completed)        Follow up plan: Return in about 4 months (around 10/17/2013), or as needed, for follow up.

## 2013-06-18 ENCOUNTER — Telehealth: Payer: Self-pay | Admitting: Family Medicine

## 2013-06-18 NOTE — Telephone Encounter (Signed)
Relevant patient education mailed to patient.  

## 2013-06-26 ENCOUNTER — Telehealth: Payer: Self-pay

## 2013-06-26 NOTE — Telephone Encounter (Signed)
Relevant patient education mailed to patient.  

## 2013-07-02 ENCOUNTER — Other Ambulatory Visit: Payer: Self-pay | Admitting: Family Medicine

## 2013-07-11 ENCOUNTER — Other Ambulatory Visit: Payer: Self-pay | Admitting: Family Medicine

## 2013-07-12 ENCOUNTER — Other Ambulatory Visit: Payer: Self-pay | Admitting: Family Medicine

## 2013-07-12 NOTE — Telephone Encounter (Signed)
Rout to pt's endocrinologist since he is managing pt's DM care

## 2013-07-16 ENCOUNTER — Other Ambulatory Visit: Payer: Self-pay | Admitting: Gynecology

## 2013-07-22 ENCOUNTER — Ambulatory Visit (INDEPENDENT_AMBULATORY_CARE_PROVIDER_SITE_OTHER): Payer: Medicare Other | Admitting: Endocrinology

## 2013-07-22 ENCOUNTER — Encounter: Payer: Self-pay | Admitting: Endocrinology

## 2013-07-22 VITALS — BP 138/68 | HR 78 | Temp 98.3°F | Resp 14 | Ht <= 58 in | Wt 156.4 lb

## 2013-07-22 DIAGNOSIS — IMO0001 Reserved for inherently not codable concepts without codable children: Secondary | ICD-10-CM | POA: Diagnosis not present

## 2013-07-22 DIAGNOSIS — E785 Hyperlipidemia, unspecified: Secondary | ICD-10-CM | POA: Diagnosis not present

## 2013-07-22 DIAGNOSIS — IMO0002 Reserved for concepts with insufficient information to code with codable children: Secondary | ICD-10-CM

## 2013-07-22 DIAGNOSIS — I10 Essential (primary) hypertension: Secondary | ICD-10-CM | POA: Diagnosis not present

## 2013-07-22 DIAGNOSIS — E1165 Type 2 diabetes mellitus with hyperglycemia: Secondary | ICD-10-CM

## 2013-07-22 LAB — URINALYSIS, ROUTINE W REFLEX MICROSCOPIC
Bilirubin Urine: NEGATIVE
Hgb urine dipstick: NEGATIVE
KETONES UR: NEGATIVE
Leukocytes, UA: NEGATIVE
Nitrite: NEGATIVE
PH: 5 (ref 5.0–8.0)
Specific Gravity, Urine: 1.02 (ref 1.000–1.030)
TOTAL PROTEIN, URINE-UPE24: NEGATIVE
Urine Glucose: NEGATIVE
Urobilinogen, UA: 0.2 (ref 0.0–1.0)

## 2013-07-22 LAB — COMPREHENSIVE METABOLIC PANEL
ALK PHOS: 92 U/L (ref 39–117)
ALT: 14 U/L (ref 0–35)
AST: 20 U/L (ref 0–37)
Albumin: 3.5 g/dL (ref 3.5–5.2)
BUN: 42 mg/dL — AB (ref 6–23)
CO2: 28 mEq/L (ref 19–32)
Calcium: 9.3 mg/dL (ref 8.4–10.5)
Chloride: 102 mEq/L (ref 96–112)
Creatinine, Ser: 0.9 mg/dL (ref 0.4–1.2)
GFR: 61.03 mL/min (ref >60.00)
Glucose, Bld: 94 mg/dL (ref 70–99)
Potassium: 4.6 mEq/L (ref 3.5–5.1)
SODIUM: 137 meq/L (ref 135–145)
TOTAL PROTEIN: 7 g/dL (ref 6.0–8.3)
Total Bilirubin: 0.6 mg/dL (ref 0.3–1.2)

## 2013-07-22 LAB — LIPID PANEL
Cholesterol: 123 mg/dL (ref 0–200)
HDL: 44 mg/dL (ref >39.00)
LDL CALC: 61 mg/dL (ref 0–99)
Total CHOL/HDL Ratio: 3
Triglycerides: 90 mg/dL (ref 0.0–149.0)
VLDL: 18 mg/dL (ref 0.0–40.0)

## 2013-07-22 LAB — MICROALBUMIN / CREATININE URINE RATIO
Creatinine,U: 55.9 mg/dL
Microalb Creat Ratio: 0.2 mg/g (ref 0.0–30.0)
Microalb, Ur: 0.1 mg/dL (ref 0.0–1.9)

## 2013-07-22 LAB — HEMOGLOBIN A1C: Hgb A1c MFr Bld: 6.6 % — ABNORMAL HIGH (ref 4.6–6.5)

## 2013-07-22 LAB — GLUCOSE, POCT (MANUAL RESULT ENTRY): POC Glucose: 119 mg/dl — AB (ref 70–99)

## 2013-07-22 NOTE — Progress Notes (Signed)
[name], female   DOB: 07-19-1927, 80 y.o.   MRN: [id number]   Reason for Appointment: Diabetes follow-up   History of Present Illness   Diagnosis: Type 2 DIABETES MELITUS, date of diagnosis: 1991   She has had mild diabetes for several years and has been on Actos only since about 2001 Her A1c has generally been under 7% She was given a trial of metformin because of her difficulty with weight loss and also edema but she claims it made her blood sugars go up and will not try it again Also she thinks her blood sugars were higher with Januvia Currently not having any side effects from Actos  Despite discussion she is only checking her sugar in the afternoon which is not postprandial and these readings are excellent However her A1c tends to be over 7%         Oral hypoglycemic drugs: Actos        Side effects from medications: None Monitors blood glucose: Once a day.    Glucometer: One Touch.          Blood Glucose readings from recall: readings 110-120 usually at 3 PM Hypoglycemia frequency: Never.           Meals: 2  meals per day.  usually compliant with diet         Physical activity:  minimal           Dietician visit: Most recent:Several years ago           Last pneumococcal vaccine: 2005   Wt Readings from Last 3 Encounters:  07/22/13 156 lb 6.4 oz (70.943 kg)  06/17/13 156 lb (70.761 kg)  05/27/13 157 lb 8 oz (71.442 kg)   Lab Results  Component Value Date   HGBA1C 6.6* 07/22/2013   HGBA1C 7.3* 12/10/2012   HGBA1C 7.6* 01/10/2011   Lab Results  Component Value Date   MICROALBUR 0.1 07/22/2013   LDLCALC 61 07/22/2013   CREATININE 0.9 07/22/2013       Medication List       This list is accurate as of: 07/22/13 11:59 PM.  Always use your most recent med list.               ALIGN 4 MG Caps  Take 1 capsule by mouth daily.     ALPRAZolam 1 MG tablet  Commonly known as:  XANAX  TAKE 1/2 TABLET BY MOUTH 4 TIMES A DAY     aspirin 81 MG tablet   Take 81 mg by mouth daily.     CALCIUM PO  Take 600 mg by mouth daily.     clotrimazole 1 % cream  Commonly known as:  LOTRIMIN  APPLY 2 (TWO) TIMES DAILY. FOR A MINIMUM OF 2 WEEKS     DEXILANT 30 MG capsule  Generic drug:  Dexlansoprazole  TAKE 1 CAPSULE BY MOUTH DAILY     estradiol 0.5 MG tablet  Commonly known as:  ESTRACE  TAKE 1 TABLET BY MOUTH DAILY     fluticasone 50 MCG/ACT nasal spray  Commonly known as:  FLONASE  Place 2 sprays into both nostrils daily.     glucosamine-chondroitin 500-400 MG tablet  Take 1 tablet by mouth daily.     HYDROcodone-acetaminophen 5-325 MG per tablet  Commonly known as:  NORCO/VICODIN  TAKE 1/2 TABLET BY MOUTH EVERY 6 HOURS AS NEEDED     naproxen sodium 220 MG tablet  Commonly known as:  ANAPROX  Take 220 mg by mouth at bedtime.     nitroGLYCERIN 0.4 MG SL tablet  Commonly known as:  NITROSTAT  Place 0.4 mg under the tongue every 5 (five) minutes as needed. For chest pain May take up to 3 doses, if pain persists call 911.     nystatin-triamcinolone ointment  Commonly known as:  MYCOLOG  APPLY TO AFFECTED AREA AS NEEDED     ONE TOUCH ULTRA TEST test strip  Generic drug:  glucose blood  USE AS DIRECTED     pioglitazone 45 MG tablet  Commonly known as:  ACTOS  TAKE 1 TABLET (45 MG TOTAL) BY MOUTH DAILY.     pravastatin 20 MG tablet  Commonly known as:  PRAVACHOL  TAKE 1 TABLET BY MOUTH DAILY.     ramipril 10 MG capsule  Commonly known as:  ALTACE  TAKE 1 CAPSULE BY MOUTH EVERY DAY     traZODone 150 MG tablet  Commonly known as:  DESYREL  Take 150 mg by mouth at bedtime.     triamcinolone cream 0.1 %  Commonly known as:  KENALOG  Apply topically 2 (two) times daily. Apply to AA on legs.     triamterene-hydrochlorothiazide 37.5-25 MG per tablet  Commonly known as:  MAXZIDE-25     valACYclovir 500 MG tablet  Commonly known as:  VALTREX  Take 2 tablets (1,000 mg total) by mouth 2 (two) times daily.     VITAMIN B  12 PO  Take 1 tablet by mouth daily.     VITAMIN C PO  Take 1,000 mg by mouth daily.     VITAMIN E PO  Take 1 tablet by mouth daily.        Allergies:  Allergies  Allergen Reactions  . Amoxicillin-Pot Clavulanate Diarrhea  . Metformin And Related Diarrhea    Past Medical History  Diagnosis Date  . Hypertension   . Coronary artery disease     CABG 1998  . Anxiety   . Diastolic dysfunction   . Hyperlipidemia   . PUD (peptic ulcer disease)   . Microcytic anemia   . Depression   . Diabetes mellitus     type 2, followed by endo.  . Diverticulosis   . Hemorrhoids   . History of heart attack   . Irritable bowel syndrome   . Chronic back pain 03/2012    lumbar DDD with herniation - s/p L S1 transforaminal ESI (10/2012 Dr. Sharlet Salina)  . Varicose veins   . Cystocele   . Hx of echocardiogram     a. Echo 10/13: mild LVH, EF 55-60%, Gr 1 diast dysfn, PASP 32    Past Surgical History  Procedure Laterality Date  . Tympanoplasty  1960s    R side  . Coronary artery bypass graft  1998  . Cataract surgery    . Cholecystectomy    . Appendectomy      per pt report  . Colonoscopy  02/2010    extensive diverticulosis throughout, small internal hemorrhoids, rec rpt 5 yrs (Dr. Allyn Kenner)  . Hand surgery    . Lexiscan myoview  03/2011    Small basal inferolateral and anterolateral reversible perfusion defect suggests ischemia.  EF was normal.   old infarct, no new ischemia  . Toe surgery    . Abdominal hysterectomy  1975    TAH,BSO  . Vault prolapse a & p repair  2009    Dr [facility] hospital  . Dexa scan  09/2011  femur -0.8, forearm 0.6 => normal  . Esi  2014    L S1 transforaminal ESI x2 (Chasnis) without improvement    Family History  Problem Relation Age of Onset  . Hypertension Mother   . Stroke Mother   . Diabetes Father   . Diabetes Sister   . Diabetes Brother   . Colon cancer Neg Hx     Social History:  reports that she has quit smoking. She has never  used smokeless tobacco. She reports that she does not drink alcohol or use illicit drugs.  Review of Systems:  HYPERTENSION:  she is having good control usually with ramipril, blood pressure initially high today, usually well controlled with PCP  HYPERLIPIDEMIA: The lipid abnormality consists of elevated LDL treated with pravastatin low-dose.  Low back pain: Has been seen by PCP for this Asking about leg pains which is mostly in her ankle joints     Examination:   BP 138/68  Pulse 78  Temp(Src) 98.3 F (36.8 C)  Resp 14  Ht 4\' 10"  (1.473 m)  Wt 156 lb 6.4 oz (70.943 kg)  BMI 32.70 kg/m2  SpO2 96%  Body mass index is 32.7 kg/(m^2).   Repeat 138/68  No pedal edema  ASSESSMENT/ PLAN:   Currently The patient's diabetes control appears to be fairly good but will assess with A1c today She has generally been well controlled with Actos alone and does not have any edema currently  Also has had a trial of Januvia and metformin previously which the patient thinks did not help her control  Hypertension: Blood pressure is fairly well controlled although tends to go up with anxiety She will continue followup with her PCP and cardiologist   Elayne Snare 07/23/2013, 9:58 AM   Addendum:  A1c is excellent, LDL at target  Office Visit on 07/22/2013  Component Date Value Ref Range Status  . POC Glucose 07/22/2013 119* 70 - 99 mg/dl Final  . Hemoglobin A1C 07/22/2013 6.6* 4.6 - 6.5 % Final   Glycemic Control Guidelines for People with Diabetes:Non Diabetic:  <6%Goal of Therapy: <7%Additional Action Suggested:  >8%   . Sodium 07/22/2013 137  135 - 145 mEq/L Final  . Potassium 07/22/2013 4.6  3.5 - 5.1 mEq/L Final  . Chloride 07/22/2013 102  96 - 112 mEq/L Final  . CO2 07/22/2013 28  19 - 32 mEq/L Final  . Glucose, Bld 07/22/2013 94  70 - 99 mg/dL Final  . BUN 07/22/2013 42* 6 - 23 mg/dL Final  . Creatinine, Ser 07/22/2013 0.9  0.4 - 1.2 mg/dL Final  . Total Bilirubin 07/22/2013 0.6  0.3  - 1.2 mg/dL Final  . Alkaline Phosphatase 07/22/2013 92  39 - 117 U/L Final  . AST 07/22/2013 20  0 - 37 U/L Final  . ALT 07/22/2013 14  0 - 35 U/L Final  . Total Protein 07/22/2013 7.0  6.0 - 8.3 g/dL Final  . Albumin 07/22/2013 3.5  3.5 - 5.2 g/dL Final  . Calcium 07/22/2013 9.3  8.4 - 10.5 mg/dL Final  . GFR 07/22/2013 61.03  >60.00 mL/min Final  . Cholesterol 07/22/2013 123  0 - 200 mg/dL Final   ATP III Classification       Desirable:  < 200 mg/dL               Borderline High:  200 - 239 mg/dL          High:  > = 240 mg/dL  . Triglycerides 07/22/2013  90.0  0.0 - 149.0 mg/dL Final   Normal:  <150 mg/dLBorderline High:  150 - 199 mg/dL  . HDL 07/22/2013 44.00  >39.00 mg/dL Final  . VLDL 07/22/2013 18.0  0.0 - 40.0 mg/dL Final  . LDL Cholesterol 07/22/2013 61  0 - 99 mg/dL Final  . Total CHOL/HDL Ratio 07/22/2013 3   Final                  Men          Women1/2 Average Risk     3.4          3.3Average Risk          5.0          4.42X Average Risk          9.6          7.13X Average Risk          15.0          11.0                      . Microalb, Ur 07/22/2013 0.1  0.0 - 1.9 mg/dL Final  . Creatinine,U 07/22/2013 55.9   Final  . Microalb Creat Ratio 07/22/2013 0.2  0.0 - 30.0 mg/g Final  . Color, Urine 07/22/2013 YELLOW  Yellow;Lt. Yellow Final  . APPearance 07/22/2013 SL CLOUDY* Clear Final  . Specific Gravity, Urine 07/22/2013 1.020  1.000-1.030 Final  . pH 07/22/2013 5.0  5.0 - 8.0 Final  . Total Protein, Urine 07/22/2013 NEGATIVE  Negative Final  . Urine Glucose 07/22/2013 NEGATIVE  Negative Final  . Ketones, ur 07/22/2013 NEGATIVE  Negative Final  . Bilirubin Urine 07/22/2013 NEGATIVE  Negative Final  . Hgb urine dipstick 07/22/2013 NEGATIVE  Negative Final  . Urobilinogen, UA 07/22/2013 0.2  0.0 - 1.0 Final  . Leukocytes, UA 07/22/2013 NEGATIVE  Negative Final  . Nitrite 07/22/2013 NEGATIVE  Negative Final  . WBC, UA 07/22/2013 0-2/hpf  0-2/hpf Final  . RBC / HPF 07/22/2013  0-2/hpf  0-2/hpf Final  . Squamous Epithelial / LPF 07/22/2013 Rare(0-4/hpf)  Rare(0-4/hpf) Final  . Bacteria, UA 07/22/2013 Many(>50/hpf)* None Final   Results faxed to site/floor on 07/22/2013 4:30 PM by Delorise Jackson.

## 2013-07-22 NOTE — Patient Instructions (Signed)
Please check blood sugars at least half the time about 2 hours after any meal and as directed on waking up. Please bring blood sugar monitor to each visit  Medicare B pays for strips

## 2013-07-22 NOTE — Progress Notes (Signed)
Quick Note:  Please let patient know that the lab results are good and no further action needed ______

## 2013-07-25 ENCOUNTER — Other Ambulatory Visit: Payer: Self-pay

## 2013-07-25 MED ORDER — HYDROCODONE-ACETAMINOPHEN 5-325 MG PO TABS: ORAL_TABLET | ORAL | Status: DC

## 2013-07-25 NOTE — Telephone Encounter (Signed)
Pt request rx hydrocodone apap. Call when ready for pick up. 

## 2013-07-25 NOTE — Telephone Encounter (Signed)
Patient notified and Rx placed up front for pick up. 

## 2013-08-19 ENCOUNTER — Encounter: Payer: Self-pay | Admitting: Family Medicine

## 2013-08-19 ENCOUNTER — Ambulatory Visit (INDEPENDENT_AMBULATORY_CARE_PROVIDER_SITE_OTHER): Payer: Medicare Other | Admitting: Family Medicine

## 2013-08-19 ENCOUNTER — Ambulatory Visit (INDEPENDENT_AMBULATORY_CARE_PROVIDER_SITE_OTHER)
Admission: RE | Admit: 2013-08-19 | Discharge: 2013-08-19 | Disposition: A | Discharge status: Home / Self Care | Payer: Medicare Other | Source: Ambulatory Visit | Attending: Family Medicine | Admitting: Family Medicine

## 2013-08-19 VITALS — BP 136/72 | HR 80 | Temp 97.8°F | Wt 157.5 lb

## 2013-08-19 DIAGNOSIS — M25519 Pain in unspecified shoulder: Secondary | ICD-10-CM | POA: Diagnosis not present

## 2013-08-19 DIAGNOSIS — M25511 Pain in right shoulder: Secondary | ICD-10-CM

## 2013-08-19 DIAGNOSIS — M19019 Primary osteoarthritis, unspecified shoulder: Secondary | ICD-10-CM | POA: Diagnosis not present

## 2013-08-19 MED ORDER — HYDROCODONE-ACETAMINOPHEN 5-325 MG PO TABS: ORAL_TABLET | ORAL | Status: DC

## 2013-08-19 NOTE — Progress Notes (Signed)
Pre visit review using our clinic review tool, if applicable. No additional management support is needed unless otherwise documented below in the visit note. 

## 2013-08-19 NOTE — Progress Notes (Signed)
BP 136/72  Pulse 80  Temp(Src) 97.8 F (36.6 C) (Oral)  Wt 157 lb 8 oz (71.442 kg)   CC: R shoulder pain  Subjective:    [name], female    DOB: 22-Sep-1927, 80 y.o.   MRN: [id number]  HPI: [name] is a 80 y.o. female presenting on 08/19/2013 for Shoulder Pain   2 wk h/o R shoulder pain.  Points to right lateral upper arm at deltoid as site of pain. Laying on right shoulder doesn't cause pain.  So far has tried hydrocodone/acetaminophen for this (1/2 tab 4x/day).  Denies recent falls or trauma to shoulder.  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 Even has pain with lifting coffee pot.  Can't pick up bag of ice or gallon of water.  Lab Results  Component Value Date   HGBA1C 6.6* 07/22/2013  fasting cbg 114 yesterday.  Relevant past medical, surgical, family and social history reviewed and updated as indicated.  Allergies and medications reviewed and updated. Current Outpatient Prescriptions on File Prior to Visit  Medication Sig  . ALPRAZolam (XANAX) 1 MG tablet TAKE 1/2 TABLET BY MOUTH 4 TIMES A DAY  . Ascorbic Acid (VITAMIN C PO) Take 1,000 mg by mouth daily.   Marland Kitchen aspirin 81 MG tablet Take 81 mg by mouth daily.   Marland Kitchen CALCIUM PO Take 600 mg by mouth daily.   . clotrimazole (LOTRIMIN) 1 % cream APPLY 2 (TWO) TIMES DAILY. FOR A MINIMUM OF 2 WEEKS  . Cyanocobalamin (VITAMIN B 12 PO) Take 1 tablet by mouth daily.  Marland Kitchen DEXILANT 30 MG capsule TAKE 1 CAPSULE BY MOUTH DAILY  . estradiol (ESTRACE) 0.5 MG tablet TAKE 1 TABLET BY MOUTH DAILY  . glucosamine-chondroitin 500-400 MG tablet Take 1 tablet by mouth daily.    Marland Kitchen nystatin-triamcinolone ointment (MYCOLOG) APPLY TO AFFECTED AREA AS NEEDED  . ONE TOUCH ULTRA TEST test strip USE AS DIRECTED  . pioglitazone (ACTOS) 45 MG tablet TAKE 1 TABLET (45 MG TOTAL) BY MOUTH DAILY.  . pravastatin (PRAVACHOL) 20 MG tablet TAKE 1 TABLET BY MOUTH DAILY.  . Probiotic Product (ALIGN) 4 MG CAPS Take 1  capsule by mouth daily.  . ramipril (ALTACE) 10 MG capsule TAKE 1 CAPSULE BY MOUTH EVERY DAY  . traZODone (DESYREL) 150 MG tablet Take 150 mg by mouth at bedtime.    . triamterene-hydrochlorothiazide (MAXZIDE-25) 37.5-25 MG per tablet   . VITAMIN E PO Take 1 tablet by mouth daily.   . fluticasone (FLONASE) 50 MCG/ACT nasal spray Place 2 sprays into both nostrils daily.  . nitroGLYCERIN (NITROSTAT) 0.4 MG SL tablet Place 0.4 mg under the tongue every 5 (five) minutes as needed. For chest pain May take up to 3 doses, if pain persists call 911.  . triamcinolone cream (KENALOG) 0.1 % Apply topically 2 (two) times daily. Apply to AA on legs.  . valACYclovir (VALTREX) 500 MG tablet Take 2 tablets (1,000 mg total) by mouth 2 (two) times daily.   No current facility-administered medications on file prior to visit.    Review of Systems Per HPI unless specifically indicated above    Objective:    BP 136/72  Pulse 80  Temp(Src) 97.8 F (36.6 C) (Oral)  Wt 157 lb 8 oz (71.442 kg)  Physical Exam  Nursing note and vitals reviewed. Constitutional: She appears well-developed and well-nourished. No distress.  Musculoskeletal: She exhibits no edema.  Shoulder exam: Poor posture. No pain with  palpation of shoulder landmarks, no pain at subacromial bursa.  No pain along deltoid. Tender with range of motion with abduction and forward flexion. Pain with testing of R RTC Pain with empty can sign. Neg Yerguson, positive Speed test. + impingement. No pain with crossover test. No pain with rotation of humeral head in [place] joint.       Assessment & Plan:   Problem List Items Addressed This Visit   Right shoulder pain - Primary     Anticipate impingement of R shoulder along with RTC irritation. Treat with continued hydrocodone, aleve nightly, and provided with RTC injury exercises from [facility] pt advisor. I have continued to encourage she limit activity at work but I doubt she'll do this. Update if sxs  persist or fail to improve. Check R shoulder xrays today.    Relevant Orders      DG Shoulder Right      Lab Results  Component Value Date   CREATININE 0.9 07/22/2013    Follow up plan: Return if symptoms worsen or fail to improve.

## 2013-08-19 NOTE — Patient Instructions (Signed)
I think your pain is coming from pinching of shoulder - do stretching exercises 3 times a week, continue hydrocodone, and may use aleve nightly for 5 days to help calm inflammation. Xray of R shoulder today. Good to see you today.

## 2013-08-19 NOTE — Assessment & Plan Note (Signed)
Anticipate impingement of R shoulder along with RTC irritation. Treat with continued hydrocodone, aleve nightly, and provided with RTC injury exercises from [facility] pt advisor. I have continued to encourage she limit activity at work but I doubt she'll do this. Update if sxs persist or fail to improve. Check R shoulder xrays today.

## 2013-08-22 ENCOUNTER — Other Ambulatory Visit: Payer: Self-pay | Admitting: Family Medicine

## 2013-09-06 ENCOUNTER — Other Ambulatory Visit: Payer: Self-pay | Admitting: Family Medicine

## 2013-09-09 ENCOUNTER — Ambulatory Visit (INDEPENDENT_AMBULATORY_CARE_PROVIDER_SITE_OTHER): Payer: Medicare Other | Admitting: Cardiovascular Disease

## 2013-09-09 ENCOUNTER — Encounter: Payer: Self-pay | Admitting: Cardiovascular Disease

## 2013-09-09 VITALS — BP 120/64 | HR 75 | Ht <= 58 in | Wt 158.2 lb

## 2013-09-09 DIAGNOSIS — E785 Hyperlipidemia, unspecified: Secondary | ICD-10-CM | POA: Diagnosis not present

## 2013-09-09 DIAGNOSIS — I2510 Atherosclerotic heart disease of native coronary artery without angina pectoris: Secondary | ICD-10-CM

## 2013-09-09 NOTE — Patient Instructions (Signed)
Your physician wants you to follow-up in: 6 MONTHS You will receive a reminder letter in the mail two months in advance. If you don't receive a letter, please call our office to schedule the follow-up appointment.  Your physician recommends that you return for a FASTING lipid profile, BMET, LIVER IN 6 MONTHS   Your physician recommends that you continue on your current medications as directed. Please refer to the Current Medication list given to you today.

## 2013-09-09 NOTE — Progress Notes (Signed)
[place] Date of Birth  09/18/1927 [facility] [address]    [place], [place]  [address] [phone]  Fax  [phone]  History of Present Illness:  [name] is an 80 yo female with a Hx of CAD, CABG ( November 09, 1994), diabetes mellitus, anxiety, HTN, hyperlipidemia.   She's been under a lot of stress recently. Her son died last year. She's under a lot of stress with the extra expenses involved such as his funeral. She presented with some CP in December.  A stress myoview revealed lateral ischemia.  We offered cardiac cath but she did not want to have the cath. She presents today without any particular complaints. She has not had any episodes of chest pain or shortness of breath since I saw her a month ago. She does have occasional episodes of tachycardia. She also notes that she has some anxiety. She was last seen in January after having an abnormal stress test that revealed anterior apical ischemia. She refused cardiac catheterization at that time. She has not had any recent episodes of chest pain.  She does not want to have a cardiac catheterization since she's feeling so well.  She has had some chest burning that is nearly constant.  I suggest several times that she should consider getting the cath done and she changed to subject.  September 04, 2012:  [name] is doing well from a cardiac standpoint.  She is still working - despite having lots of back pain.  No CP .  She had some CP several years ago and had a mildly abnormal myoview.  She did not want to have a cardiac cath at that time and has not had any significant epidoses of CP since that time.  She continues to be under lots of stress  She complains of back pain, stomach pain, burning in her legs.    Nov. 24, 2014:  Pt is doing OK from a cardiac standpoint.  Lots of stress - she has been running the [employer] by herself for the past several months.  She is not getting enough rest.  Sleeping 4 1/2  hours at night - tries to get a nap for an hour.    September 09, 2013:  [name] is doing ok.  She is under lots of stress as usual.    Current Outpatient Prescriptions on File Prior to Visit  Medication Sig Dispense Refill  . ALPRAZolam (XANAX) 1 MG tablet TAKE 1/2 TABLET BY MOUTH 4 TIMES A DAY  60 tablet  3  . Ascorbic Acid (VITAMIN C PO) Take 1,000 mg by mouth daily.       Marland Kitchen aspirin 81 MG tablet Take 81 mg by mouth daily.       Marland Kitchen CALCIUM PO Take 600 mg by mouth daily.       . clotrimazole (LOTRIMIN) 1 % cream APPLY 2 (TWO) TIMES DAILY. FOR A MINIMUM OF 2 WEEKS  30 g  0  . Cyanocobalamin (VITAMIN B 12 PO) Take 1 tablet by mouth daily.      Marland Kitchen DEXILANT 30 MG capsule TAKE 1 CAPSULE BY MOUTH DAILY  30 capsule  5  . estradiol (ESTRACE) 0.5 MG tablet TAKE 1 TABLET BY MOUTH DAILY  30 tablet  3  . glucosamine-chondroitin 500-400 MG tablet Take 1 tablet by mouth daily.        Marland Kitchen HYDROcodone-acetaminophen (NORCO/VICODIN) 5-325 MG per tablet TAKE 1/2 TABLET BY MOUTH EVERY 6 HOURS AS NEEDED  60 tablet  0  . nitroGLYCERIN (NITROSTAT) 0.4 MG SL tablet Place 0.4 mg under the tongue every 5 (five) minutes as needed. For chest pain May take up to 3 doses, if pain persists call 911.      Marland Kitchen ONE TOUCH ULTRA TEST test strip USE AS DIRECTED  100 each  3  . pioglitazone (ACTOS) 45 MG tablet TAKE 1 TABLET (45 MG TOTAL) BY MOUTH DAILY.  30 tablet  5  . pravastatin (PRAVACHOL) 20 MG tablet TAKE 1 TABLET BY MOUTH DAILY.  30 tablet  11  . Probiotic Product (ALIGN) 4 MG CAPS Take 1 capsule by mouth daily.  90 capsule  3  . ramipril (ALTACE) 10 MG capsule TAKE 1 CAPSULE BY MOUTH EVERY DAY  30 capsule  3  . traZODone (DESYREL) 150 MG tablet Take 150 mg by mouth at bedtime.        . triamterene-hydrochlorothiazide (MAXZIDE-25) 37.5-25 MG per tablet       . VITAMIN E PO Take 1 tablet by mouth daily.        No current facility-administered medications on file prior to visit.    Allergies  Allergen Reactions  .  Amoxicillin-Pot Clavulanate Diarrhea  . Metformin And Related Diarrhea    Past Medical History  Diagnosis Date  . Hypertension   . Coronary artery disease     CABG 1998  . Anxiety   . Diastolic dysfunction   . Hyperlipidemia   . PUD (peptic ulcer disease)   . Microcytic anemia   . Depression   . Diabetes mellitus     type 2, followed by endo.  . Diverticulosis   . Hemorrhoids   . History of heart attack   . Irritable bowel syndrome   . Chronic back pain 03/2012    lumbar DDD with herniation - s/p L S1 transforaminal ESI (10/2012 Dr. Sharlet Salina)  . Varicose veins   . Cystocele   . Hx of echocardiogram     a. Echo 10/13: mild LVH, EF 55-60%, Gr 1 diast dysfn, PASP 32    Past Surgical History  Procedure Laterality Date  . Tympanoplasty  1960s    R side  . Coronary artery bypass graft  1998  . Cataract surgery    . Cholecystectomy    . Appendectomy      per pt report  . Colonoscopy  02/2010    extensive diverticulosis throughout, small internal hemorrhoids, rec rpt 5 yrs (Dr. Allyn Kenner)  . Hand surgery    . Lexiscan myoview  03/2011    Small basal inferolateral and anterolateral reversible perfusion defect suggests ischemia.  EF was normal.   old infarct, no new ischemia  . Toe surgery    . Abdominal hysterectomy  1975    TAH,BSO  . Vault prolapse a & p repair  2009    Dr [facility] hospital  . Dexa scan  09/2011    femur -0.8, forearm 0.6 => normal  . Esi  2014    L S1 transforaminal ESI x2 (Chasnis) without improvement    History  Smoking status  . Former Smoker  Smokeless tobacco  . Never Used    History  Alcohol Use No    Family History  Problem Relation Age of Onset  . Hypertension Mother   . Stroke Mother   . Diabetes Father   . Diabetes Sister   . Diabetes Brother   . Colon cancer Neg Hx     Reviw of Systems:  Reviewed in the HPI.  All other systems are negative.  Physical Exam: BP 120/64  Pulse 75  Ht 4\' 10"  (1.473 m)  Wt 158 lb 3.2 oz  (71.759 kg)  BMI 33.07 kg/m2 The patient is alert and oriented x 3.  The mood and affect are normal.   Skin: warm and dry.  Color is normal.    HEENT:   Normocephalic/atraumatic. Neck is supple. Her mucous membranes are moist. The JVD. Her carotids are normal.  Lungs: Lung exam is clear.   She has a slight reddish raskhon her upper chest.    Heart: Regular rate S1-S2. She has no murmurs.    Abdomen: Abdomen soft.  Extremities:  No clubbing cyanosis or edema  Neuro:  Exam is nonfocal.    ECG: September 09, 2013:  Normal sinus rhythm at 75. She has no ST or T wave changes.   Assessment / Plan:

## 2013-09-09 NOTE — Assessment & Plan Note (Addendum)
[name] is  doing great from a cardiac standpoint. She's not had any episodes of chest pain or shortness of breath. We'll see her back in the office in 6 months but will check fasting lipids, liver enzymes, and basic metabolic profile at that time.

## 2013-09-09 NOTE — Assessment & Plan Note (Signed)
Will check lipids at next visit.

## 2013-09-10 DIAGNOSIS — H10029 Other mucopurulent conjunctivitis, unspecified eye: Secondary | ICD-10-CM | POA: Diagnosis not present

## 2013-09-11 ENCOUNTER — Ambulatory Visit (INDEPENDENT_AMBULATORY_CARE_PROVIDER_SITE_OTHER): Payer: Medicare Other | Admitting: Family Medicine

## 2013-09-11 ENCOUNTER — Encounter: Payer: Self-pay | Admitting: Family Medicine

## 2013-09-11 VITALS — BP 118/66 | HR 82 | Temp 98.2°F | Wt 155.0 lb

## 2013-09-11 DIAGNOSIS — J019 Acute sinusitis, unspecified: Secondary | ICD-10-CM

## 2013-09-11 DIAGNOSIS — I2510 Atherosclerotic heart disease of native coronary artery without angina pectoris: Secondary | ICD-10-CM | POA: Diagnosis not present

## 2013-09-11 MED ORDER — DOXYCYCLINE HYCLATE 100 MG PO CAPS: 100.0000 mg | ORAL_CAPSULE | Freq: Two times a day (BID) | ORAL | Status: DC

## 2013-09-11 MED ORDER — HYDROCODONE-ACETAMINOPHEN 5-325 MG PO TABS: ORAL_TABLET | ORAL | Status: DC

## 2013-09-11 NOTE — Progress Notes (Signed)
Pre visit review using our clinic review tool, if applicable. No additional management support is needed unless otherwise documented below in the visit note. 

## 2013-09-11 NOTE — Progress Notes (Signed)
BP 118/66  Pulse 82  Temp(Src) 98.2 F (36.8 C) (Oral)  Wt 155 lb (70.308 kg)  SpO2 96%   CC: sinusitis?  Subjective:    [name], female    DOB: June 02, 1927, 80 y.o.   MRN: [id number]  HPI: [name] is a 80 y.o. female presenting on 09/11/2013 for Eye Problem   1 wk h/o sinus sxs, coughing. Watery red itchy eyes for 1 week then head congestion, sinus pressure and worsening cough started 2 days ago. Some chills. +PNrdainage and ST. Dry cough. Tried cough drop. No sick contacts at home. Scrubby may have started having cough.  Saw eye doctor yesterday and prescribed some eye drops but unsure what type. Eye doctor thought she may have sinusitis and was advised to come here for eval. No blurry vision.  Relevant past medical, surgical, family and social history reviewed and updated as indicated.  Allergies and medications reviewed and updated. Current Outpatient Prescriptions on File Prior to Visit  Medication Sig  . ALPRAZolam (XANAX) 1 MG tablet TAKE 1/2 TABLET BY MOUTH 4 TIMES A DAY  . Ascorbic Acid (VITAMIN C PO) Take 1,000 mg by mouth daily.   Marland Kitchen aspirin 81 MG tablet Take 81 mg by mouth daily.   Marland Kitchen CALCIUM PO Take 600 mg by mouth daily.   . clotrimazole (LOTRIMIN) 1 % cream APPLY 2 (TWO) TIMES DAILY. FOR A MINIMUM OF 2 WEEKS  . Cyanocobalamin (VITAMIN B 12 PO) Take 1 tablet by mouth daily.  Marland Kitchen DEXILANT 30 MG capsule TAKE 1 CAPSULE BY MOUTH DAILY  . estradiol (ESTRACE) 0.5 MG tablet TAKE 1 TABLET BY MOUTH DAILY  . glucosamine-chondroitin 500-400 MG tablet Take 1 tablet by mouth daily.    Marland Kitchen HYDROcodone-acetaminophen (NORCO/VICODIN) 5-325 MG per tablet TAKE 1/2 TABLET BY MOUTH EVERY 6 HOURS AS NEEDED  . nitroGLYCERIN (NITROSTAT) 0.4 MG SL tablet Place 0.4 mg under the tongue every 5 (five) minutes as needed. For chest pain May take up to 3 doses, if pain persists call 911.  Marland Kitchen ONE TOUCH ULTRA TEST test strip USE AS DIRECTED  . pioglitazone (ACTOS) 45 MG  tablet TAKE 1 TABLET (45 MG TOTAL) BY MOUTH DAILY.  . pravastatin (PRAVACHOL) 20 MG tablet TAKE 1 TABLET BY MOUTH DAILY.  . Probiotic Product (ALIGN) 4 MG CAPS Take 1 capsule by mouth daily.  . ramipril (ALTACE) 10 MG capsule TAKE 1 CAPSULE BY MOUTH EVERY DAY  . traZODone (DESYREL) 150 MG tablet Take 150 mg by mouth at bedtime.    . triamterene-hydrochlorothiazide (MAXZIDE-25) 37.5-25 MG per tablet   . VITAMIN E PO Take 1 tablet by mouth daily.    No current facility-administered medications on file prior to visit.    Review of Systems Per HPI unless specifically indicated above    Objective:    BP 118/66  Pulse 82  Temp(Src) 98.2 F (36.8 C) (Oral)  Wt 155 lb (70.308 kg)  SpO2 96%  Physical Exam  Nursing note and vitals reviewed. Constitutional: She appears well-developed and well-nourished. No distress.  Fatigued appearing  HENT:  Head: Normocephalic and atraumatic.  Right Ear: Hearing, tympanic membrane, external ear and ear canal normal.  Left Ear: Hearing, tympanic membrane, external ear and ear canal normal.  Nose: No mucosal edema or rhinorrhea. Right sinus exhibits no maxillary sinus tenderness and no frontal sinus tenderness. Left sinus exhibits no maxillary sinus tenderness and no frontal sinus tenderness.  Mouth/Throat: Uvula is midline, oropharynx is clear and  moist and mucous membranes are normal. No oropharyngeal exudate, posterior oropharyngeal edema, posterior oropharyngeal erythema or tonsillar abscesses.  Eyes: Conjunctivae and EOM are normal. Pupils are equal, round, and reactive to light. No scleral icterus.  Neck: Normal range of motion. Neck supple.  Cardiovascular: Normal rate, regular rhythm, normal heart sounds and intact distal pulses.   No murmur heard. Pulmonary/Chest: Effort normal and breath sounds normal. No respiratory distress. She has no wheezes. She has no rales.  Slightly coarse  Lymphadenopathy:    She has no cervical adenopathy.  Skin:  Skin is warm and dry. No rash noted.       Assessment & Plan:   Problem List Items Addressed This Visit   Acute sinusitis - Primary     In setting of chronic nasal congestion. Given acute worsening over last few days, will treat with antibiotic course. Precautions to return discussed.    Relevant Medications      doxy 10d       Follow up plan: Return if symptoms worsen or fail to improve.

## 2013-09-11 NOTE — Patient Instructions (Signed)
You have a sinus infection. Take medicine as prescribed: doxycycline 10 day course - take with food Push fluids and plenty of rest. Nasal saline irrigation or neti pot to help drain sinuses. May use plain mucinex with plenty of fluid to help mobilize mucous. Let us know if fever >101.5, trouble opening/closing mouth, difficulty swallowing, or worsening - you may need to be seen again.

## 2013-09-11 NOTE — Assessment & Plan Note (Signed)
In setting of chronic nasal congestion. Given acute worsening over last few days, will treat with antibiotic course. Precautions to return discussed.

## 2013-09-11 NOTE — Addendum Note (Signed)
Addended by: Ria Bush on: 09/11/2013 09:56 AM   Modules accepted: Orders

## 2013-09-19 ENCOUNTER — Other Ambulatory Visit: Payer: Self-pay | Admitting: Family Medicine

## 2013-09-19 NOTE — Telephone Encounter (Signed)
Ok to refill? Not on current med list. 

## 2013-09-20 ENCOUNTER — Other Ambulatory Visit: Payer: Self-pay | Admitting: Family Medicine

## 2013-09-23 DIAGNOSIS — H10029 Other mucopurulent conjunctivitis, unspecified eye: Secondary | ICD-10-CM | POA: Diagnosis not present

## 2013-09-24 ENCOUNTER — Other Ambulatory Visit: Payer: Self-pay | Admitting: Family Medicine

## 2013-09-29 ENCOUNTER — Other Ambulatory Visit: Payer: Self-pay | Admitting: Family Medicine

## 2013-10-22 ENCOUNTER — Other Ambulatory Visit: Payer: Self-pay

## 2013-10-22 NOTE — Telephone Encounter (Signed)
Pt left v/m requesting rx hydrocodone apap. Call when ready for pick up. Pt only has 2 pills left.

## 2013-10-23 MED ORDER — HYDROCODONE-ACETAMINOPHEN 5-325 MG PO TABS: ORAL_TABLET | ORAL | Status: DC

## 2013-10-23 NOTE — Telephone Encounter (Signed)
Female notified Rx was ready and said he will notify patient. Rx placed up front for pick up.

## 2013-10-23 NOTE — Telephone Encounter (Signed)
Printed and placed in Kim's box. 

## 2013-10-28 DIAGNOSIS — F429 Obsessive-compulsive disorder, unspecified: Secondary | ICD-10-CM | POA: Diagnosis not present

## 2013-10-28 DIAGNOSIS — F411 Generalized anxiety disorder: Secondary | ICD-10-CM | POA: Diagnosis not present

## 2013-11-18 ENCOUNTER — Ambulatory Visit (INDEPENDENT_AMBULATORY_CARE_PROVIDER_SITE_OTHER): Payer: Medicare Other | Admitting: Endocrinology

## 2013-11-18 ENCOUNTER — Encounter: Payer: Self-pay | Admitting: Endocrinology

## 2013-11-18 VITALS — BP 135/64 | HR 64 | Temp 98.0°F | Resp 14 | Ht <= 58 in | Wt 154.2 lb

## 2013-11-18 DIAGNOSIS — I10 Essential (primary) hypertension: Secondary | ICD-10-CM | POA: Diagnosis not present

## 2013-11-18 DIAGNOSIS — IMO0002 Reserved for concepts with insufficient information to code with codable children: Secondary | ICD-10-CM

## 2013-11-18 DIAGNOSIS — E1149 Type 2 diabetes mellitus with other diabetic neurological complication: Secondary | ICD-10-CM | POA: Diagnosis not present

## 2013-11-18 DIAGNOSIS — E1142 Type 2 diabetes mellitus with diabetic polyneuropathy: Secondary | ICD-10-CM

## 2013-11-18 DIAGNOSIS — IMO0001 Reserved for inherently not codable concepts without codable children: Secondary | ICD-10-CM | POA: Diagnosis not present

## 2013-11-18 DIAGNOSIS — I2510 Atherosclerotic heart disease of native coronary artery without angina pectoris: Secondary | ICD-10-CM

## 2013-11-18 DIAGNOSIS — E1165 Type 2 diabetes mellitus with hyperglycemia: Secondary | ICD-10-CM

## 2013-11-18 LAB — BASIC METABOLIC PANEL
BUN: 31 mg/dL — AB (ref 6–23)
CHLORIDE: 104 meq/L (ref 96–112)
CO2: 26 mEq/L (ref 19–32)
Calcium: 9.1 mg/dL (ref 8.4–10.5)
Creatinine, Ser: 1.1 mg/dL (ref 0.4–1.2)
GFR: 50.77 mL/min — ABNORMAL LOW (ref >60.00)
GLUCOSE: 94 mg/dL (ref 70–99)
POTASSIUM: 5 meq/L (ref 3.5–5.1)
SODIUM: 138 meq/L (ref 135–145)

## 2013-11-18 LAB — HEMOGLOBIN A1C: Hgb A1c MFr Bld: 6.6 % — ABNORMAL HIGH (ref 4.6–6.5)

## 2013-11-18 MED ORDER — GABAPENTIN 100 MG PO CAPS: 100.0000 mg | ORAL_CAPSULE | Freq: Three times a day (TID) | ORAL | Status: DC

## 2013-11-18 NOTE — Patient Instructions (Signed)
Please check blood sugars at least half the time about 2 hours after any meal and 2 times per week on waking up. Please bring blood sugar monitor to each visit  Gabapentin 1-2  Capsules upto 3x daily as needed for leg pain

## 2013-11-18 NOTE — Progress Notes (Signed)
[name], female   DOB: [birth year], 80 y.o.   MRN: [id number]   Reason for Appointment: Diabetes follow-up   History of Present Illness   Diagnosis: Type 2 DIABETES MELITUS, date of diagnosis: 1991   She has had mild diabetes for several years and has been on Actos only since about 2001 Her A1c has generally been under 7% She was given a trial of metformin because of her difficulty with weight loss and also edema but she felt that it made her blood sugars go up and will not try it again Also she thinks her blood sugars were higher with Januvia Currently not having any side effects from Actos  Despite discussion she is only checking her sugar in the afternoon which is not postprandial and these readings are excellent Usually her A1c tends to be over 7%; in 4/15 it was improved at 6.6         Oral hypoglycemic drugs: Actos 45 mg daily        Side effects from medications: None Monitors blood glucose: Once a day.    Glucometer: One Touch.          Blood Glucose readings from recall: 96-109 done in the afternoon. Does not remember to do it any other time Hypoglycemia frequency: Never.           Meals: 2  meals per day.  usually compliant with diet         Physical activity:  minimal           Dietician visit: Most recent:Several years ago           Last pneumococcal vaccine: 2005   Wt Readings from Last 3 Encounters:  11/18/13 154 lb 3.2 oz (69.945 kg)  09/11/13 155 lb (70.308 kg)  09/09/13 158 lb 3.2 oz (71.759 kg)   Lab Results  Component Value Date   HGBA1C 6.6* 11/18/2013   HGBA1C 6.6* 07/22/2013   HGBA1C 7.3* 12/10/2012   Lab Results  Component Value Date   MICROALBUR 0.1 07/22/2013   LDLCALC 61 07/22/2013   CREATININE 1.1 11/18/2013       Medication List       This list is accurate as of: 11/18/13 11:59 PM.  Always use your most recent med list.               ALIGN 4 MG Caps  Take 1 capsule by mouth daily.     ALPRAZolam 1 MG tablet  Commonly  known as:  XANAX  TAKE 1/2 TABLET BY MOUTH 4 TIMES A DAY     aspirin 81 MG tablet  Take 81 mg by mouth daily.     CALCIUM PO  Take 600 mg by mouth daily.     clotrimazole 1 % cream  Commonly known as:  LOTRIMIN  APPLY 2 (TWO) TIMES DAILY. FOR A MINIMUM OF 2 WEEKS     DEXILANT 30 MG capsule  Generic drug:  Dexlansoprazole  TAKE 1 CAPSULE BY MOUTH DAILY     doxycycline 100 MG capsule  Commonly known as:  VIBRAMYCIN  Take 1 capsule (100 mg total) by mouth 2 (two) times daily.     estradiol 0.5 MG tablet  Commonly known as:  ESTRACE  TAKE 1 TABLET BY MOUTH DAILY     gabapentin 100 MG capsule  Commonly known as:  NEURONTIN  Take 1 capsule (100 mg total) by mouth 3 (three) times daily.     glucosamine-chondroitin 500-400 MG  tablet  Take 1 tablet by mouth daily.     HYDROcodone-acetaminophen 5-325 MG per tablet  Commonly known as:  NORCO/VICODIN  TAKE 1/2 TABLET BY MOUTH EVERY 6 HOURS AS NEEDED     nitroGLYCERIN 0.4 MG SL tablet  Commonly known as:  NITROSTAT  Place 0.4 mg under the tongue every 5 (five) minutes as needed. For chest pain May take up to 3 doses, if pain persists call 911.     nystatin-triamcinolone ointment  Commonly known as:  MYCOLOG     ONE TOUCH ULTRA TEST test strip  Generic drug:  glucose blood  USE AS DIRECTED     pioglitazone 45 MG tablet  Commonly known as:  ACTOS  TAKE 1 TABLET (45 MG TOTAL) BY MOUTH DAILY.     pravastatin 20 MG tablet  Commonly known as:  PRAVACHOL  TAKE 1 TABLET BY MOUTH DAILY.     ramipril 10 MG capsule  Commonly known as:  ALTACE  TAKE 1 CAPSULE BY MOUTH EVERY DAY     traZODone 150 MG tablet  Commonly known as:  DESYREL  Take 150 mg by mouth at bedtime.     triamcinolone cream 0.1 %  Commonly known as:  KENALOG  APPLY TO AFFECTED AREAS TWICE A DAY ON LEGS     triamterene-hydrochlorothiazide 37.5-25 MG per tablet  Commonly known as:  MAXZIDE-25     VITAMIN B 12 PO  Take 1 tablet by mouth daily.     VITAMIN  C PO  Take 1,000 mg by mouth daily.     VITAMIN E PO  Take 1 tablet by mouth daily.        Allergies:  Allergies  Allergen Reactions  . Amoxicillin-Pot Clavulanate Diarrhea  . Metformin And Related Diarrhea    Past Medical History  Diagnosis Date  . Hypertension   . Coronary artery disease     CABG 1998  . Anxiety   . Diastolic dysfunction   . Hyperlipidemia   . PUD (peptic ulcer disease)   . Microcytic anemia   . Depression   . Diabetes mellitus     type 2, followed by endo.  . Diverticulosis   . Hemorrhoids   . History of heart attack   . Irritable bowel syndrome   . Chronic back pain 03/2012    lumbar DDD with herniation - s/p L S1 transforaminal ESI (10/2012 Dr. Sharlet Salina)  . Varicose veins   . Cystocele   . Hx of echocardiogram     a. Echo 10/13: mild LVH, EF 55-60%, Gr 1 diast dysfn, PASP 32    Past Surgical History  Procedure Laterality Date  . Tympanoplasty  1960s    R side  . Coronary artery bypass graft  1998  . Cataract surgery    . Cholecystectomy    . Appendectomy      per pt report  . Colonoscopy  02/2010    extensive diverticulosis throughout, small internal hemorrhoids, rec rpt 5 yrs (Dr. Allyn Kenner)  . Hand surgery    . Lexiscan myoview  03/2011    Small basal inferolateral and anterolateral reversible perfusion defect suggests ischemia.  EF was normal.   old infarct, no new ischemia  . Toe surgery    . Abdominal hysterectomy  1975    TAH,BSO  . Vault prolapse a & p repair  2009    Dr [facility] hospital  . Dexa scan  09/2011    femur -0.8, forearm 0.6 => normal  .  Esi  2014    L S1 transforaminal ESI x2 ([facility]) without improvement    Family History  Problem Relation Age of Onset  . Hypertension Mother   . Stroke Mother   . Diabetes Father   . Diabetes Sister   . Diabetes Brother   . Colon cancer Neg Hx     Social History:  reports that she has quit smoking. She has never used smokeless tobacco. She reports that she does not  drink alcohol or use illicit drugs.  Review of Systems:  She is asking about pain in her lower legs. This is consistent and she takes hydrocodone with some relief. However she is also having some burning sensation. Pain is not worse on walking. Does not think she has tried gabapentin previously Unable to do an exam today because she is wearing support hose that she cannot take off without difficulty  HYPERTENSION:  she is having good control usually with ramipril, blood pressure initially high today, usually well controlled with PCP  HYPERLIPIDEMIA: The lipid abnormality consists of elevated LDL treated with pravastatin low-dose.  Lab Results  Component Value Date   CHOL 123 07/22/2013   HDL 44.00 07/22/2013   LDLCALC 61 07/22/2013   TRIG 90.0 07/22/2013   CHOLHDL 3 07/22/2013    Low back pain: Occurring on and off      Examination:   BP 135/64  Pulse 64  Temp(Src) 98 F (36.7 C)  Resp 14  Ht 4\' 10"  (1.473 m)  Wt 154 lb 3.2 oz (69.945 kg)  BMI 32.24 kg/m2  SpO2 99%  Body mass index is 32.24 kg/(m^2).    No pedal edema  ASSESSMENT/ PLAN:   Probable diabetic neuropathy causing leg pain. She will be given a trial of gabapentin low-dose, discussed how this medication works, possible side effects, timing of medication and need to take it with food as well as adjust the dose as needed, may need higher doses than 100 mg but will start with smaller doses because of her age  The patient's diabetes control appears to be fairly good at home but will assess with A1c level She has generally been well controlled with Actos 45 mg alone and does not have any edema with this  Also has had a trial of Januvia and metformin previously which the patient thinks did not help her control May also consider reducing the dosage if blood sugars are still well controlled; she has difficulty losing weight Currently not exercising She does need to check blood sugars at various times of the day and she  will call if they are unusually high She will bring her monitor for review on the next visit  Discussed needing to go foot exam on her next visit and also regular eye exams annually She is up-to-date with her immunizations  Hypertension: Blood pressure is fairly well controlled and is on 10 mg ramipril along with Maxzide  Labs to be checked  She has multiple other complaints today and she can followup with PCP regarding these  Counseling time over 50% of today's 25 minute visit  Hiawatha Merriott 11/19/2013, 8:38 AM   Addendum: A1c is stable, potassium upper normal, to change Maxzide to HCTZ 12.5 mg  Office Visit on 11/18/2013  Component Date Value Ref Range Status  . Hemoglobin A1C 11/18/2013 6.6* 4.6 - 6.5 % Final   Glycemic Control Guidelines for People with Diabetes:Non Diabetic:  <6%Goal of Therapy: <7%Additional Action Suggested:  >8%   . Sodium 11/18/2013 138  135 - 145 mEq/L Final  . Potassium 11/18/2013 5.0  3.5 - 5.1 mEq/L Final  . Chloride 11/18/2013 104  96 - 112 mEq/L Final  . CO2 11/18/2013 26  19 - 32 mEq/L Final  . Glucose, Bld 11/18/2013 94  70 - 99 mg/dL Final  . BUN 11/18/2013 31* 6 - 23 mg/dL Final  . Creatinine, Ser 11/18/2013 1.1  0.4 - 1.2 mg/dL Final  . Calcium 11/18/2013 9.1  8.4 - 10.5 mg/dL Final  . GFR 11/18/2013 50.77* >60.00 mL/min Final

## 2013-11-19 DIAGNOSIS — E1142 Type 2 diabetes mellitus with diabetic polyneuropathy: Secondary | ICD-10-CM | POA: Insufficient documentation

## 2013-11-19 DIAGNOSIS — E1149 Type 2 diabetes mellitus with other diabetic neurological complication: Secondary | ICD-10-CM | POA: Insufficient documentation

## 2013-11-19 NOTE — Progress Notes (Signed)
Quick Note:  Please let patient know that the diabetes result is good but her potassium is high normal, need to change her fluid pill, Maxzide to HCTZ 12.5 mg daily ______

## 2013-11-22 ENCOUNTER — Other Ambulatory Visit: Payer: Self-pay | Admitting: *Deleted

## 2013-11-22 ENCOUNTER — Encounter: Payer: Self-pay | Admitting: *Deleted

## 2013-11-22 ENCOUNTER — Other Ambulatory Visit: Payer: Self-pay

## 2013-11-22 MED ORDER — HYDROCHLOROTHIAZIDE 12.5 MG PO CAPS: 12.5000 mg | ORAL_CAPSULE | Freq: Every day | ORAL | Status: DC

## 2013-11-22 MED ORDER — HYDROCODONE-ACETAMINOPHEN 5-325 MG PO TABS: ORAL_TABLET | ORAL | Status: DC

## 2013-11-22 NOTE — Telephone Encounter (Signed)
Pt left v/m requesting rx hydrocodone apap. Call when ready for pick up.  

## 2013-11-22 NOTE — Telephone Encounter (Signed)
Attempted to call patient. No answer, no machine. Will try again later. Rx placed up front for pick up.

## 2013-11-22 NOTE — Telephone Encounter (Signed)
P[rinted and placed in KMi's box.

## 2013-11-25 ENCOUNTER — Other Ambulatory Visit: Payer: Self-pay | Admitting: *Deleted

## 2013-11-25 MED ORDER — HYDROCHLOROTHIAZIDE 12.5 MG PO CAPS: 12.5000 mg | ORAL_CAPSULE | Freq: Every day | ORAL | Status: DC

## 2013-11-25 NOTE — Telephone Encounter (Signed)
Pt left v/m requesting cb; spoke with pt and advised rx at front desk for pick up. Pt voiced understanding.

## 2013-12-02 ENCOUNTER — Other Ambulatory Visit: Payer: Self-pay | Admitting: Gynecology

## 2013-12-02 ENCOUNTER — Telehealth: Payer: Self-pay | Admitting: Endocrinology

## 2013-12-02 NOTE — Telephone Encounter (Signed)
Patient stated that she was prescribed a medication she know nothing about, please advise

## 2013-12-03 DIAGNOSIS — I824Z1 Acute embolism and thrombosis of unspecified deep veins of right distal lower extremity: Secondary | ICD-10-CM | POA: Insufficient documentation

## 2013-12-03 HISTORY — DX: Acute embolism and thrombosis of unspecified deep veins of right distal lower extremity: I82.4Z1

## 2013-12-10 DIAGNOSIS — H04129 Dry eye syndrome of unspecified lacrimal gland: Secondary | ICD-10-CM | POA: Diagnosis not present

## 2013-12-11 ENCOUNTER — Telehealth: Payer: Self-pay | Admitting: Family Medicine

## 2013-12-11 DIAGNOSIS — L03119 Cellulitis of unspecified part of limb: Secondary | ICD-10-CM | POA: Diagnosis not present

## 2013-12-11 DIAGNOSIS — L02419 Cutaneous abscess of limb, unspecified: Secondary | ICD-10-CM | POA: Diagnosis not present

## 2013-12-11 NOTE — Telephone Encounter (Signed)
Spoke with pt and advised no available appts and pt will go to UC. Leg has been weeping since last night. Pt voiced understanding to go to UC.

## 2013-12-11 NOTE — Telephone Encounter (Signed)
Patient called back after talking with CAN and wanted to know why she couldn't be seen by her doctor today instead of [facility]. I advised that Dr. Darnell Level wasn't here and that she shouldn't delay being seen based on the nature of what was wrong. She said she wouldn't take long and would see someone else. I advised that there weren't any openings left at 4:45 and that she really needed to go to [facility]. She said that she was very disappointed that we wouldn't see her at the time she called, when we are her regular doctor and we should see her when she calls. I apologized and still advised her to go to [facility] for eval. She verbalized understanding.

## 2013-12-11 NOTE — Telephone Encounter (Signed)
Patient Information:  Caller Name: Adisa  Phone: [phone]  Patient: [name]  Gender: Female  DOB: 1927-07-04  Age: 80 Years  PCP: Ria Bush [facility])  Office Follow Up:  Does the office need to follow up with this patient?: Yes  Instructions For The Office: Please review -- can patient possibly be seen in office yet today 9/9 or would MD refer her to ER? Please call patient at  [phone].   Symptoms  Reason For Call & Symptoms: Right leg swollen including knee, is worse at night, decreased some in the mornings.  Has had a dime size spot on lateral ankle that has been tender to touch for extended time (says does not know how long) and lot of pain when touched to assist her in putting on stockings.  Arrived home last evening from eye appointment to find stocking wet and fluid in shoe.  Now today noting clear fluid running from the same area again with stocking wet and fluid going down into shoe.  Family member has put dressing on area but patient has not actually seen it.  Reviewed Health History In EMR: Yes  Reviewed Medications In EMR: Yes  Reviewed Allergies In EMR: Yes  Reviewed Surgeries / Procedures: Yes  Date of Onset of Symptoms: 12/10/2013  Guideline(s) Used:  Leg Swelling and Edema  Disposition Per Guideline:   Go to ED Now (or to Office with PCP Approval)  Reason For Disposition Reached:   Severe swelling (e.g., swelling extends above knee, entire leg is swollen, weeping fluid)  Advice Given:  N/A  Patient Will Follow Care Advice:  YES

## 2013-12-11 NOTE — Telephone Encounter (Signed)
plz schedule f//u appt with me at next available - may place at 1pm tomorrow

## 2013-12-12 ENCOUNTER — Encounter: Payer: Self-pay | Admitting: Family Medicine

## 2013-12-12 ENCOUNTER — Telehealth: Payer: Self-pay

## 2013-12-12 ENCOUNTER — Ambulatory Visit (INDEPENDENT_AMBULATORY_CARE_PROVIDER_SITE_OTHER): Payer: Medicare Other | Admitting: Family Medicine

## 2013-12-12 VITALS — BP 122/64 | HR 76 | Temp 98.1°F | Wt 156.0 lb

## 2013-12-12 DIAGNOSIS — E1149 Type 2 diabetes mellitus with other diabetic neurological complication: Secondary | ICD-10-CM | POA: Diagnosis not present

## 2013-12-12 DIAGNOSIS — I83009 Varicose veins of unspecified lower extremity with ulcer of unspecified site: Secondary | ICD-10-CM | POA: Diagnosis not present

## 2013-12-12 DIAGNOSIS — L97929 Non-pressure chronic ulcer of unspecified part of left lower leg with unspecified severity: Principal | ICD-10-CM

## 2013-12-12 DIAGNOSIS — L97909 Non-pressure chronic ulcer of unspecified part of unspecified lower leg with unspecified severity: Secondary | ICD-10-CM | POA: Diagnosis not present

## 2013-12-12 DIAGNOSIS — I2510 Atherosclerotic heart disease of native coronary artery without angina pectoris: Secondary | ICD-10-CM

## 2013-12-12 DIAGNOSIS — R600 Localized edema: Secondary | ICD-10-CM

## 2013-12-12 DIAGNOSIS — J3489 Other specified disorders of nose and nasal sinuses: Secondary | ICD-10-CM

## 2013-12-12 DIAGNOSIS — E1142 Type 2 diabetes mellitus with diabetic polyneuropathy: Secondary | ICD-10-CM | POA: Diagnosis not present

## 2013-12-12 DIAGNOSIS — I83029 Varicose veins of left lower extremity with ulcer of unspecified site: Secondary | ICD-10-CM

## 2013-12-12 DIAGNOSIS — Z23 Encounter for immunization: Secondary | ICD-10-CM | POA: Diagnosis not present

## 2013-12-12 DIAGNOSIS — R0981 Nasal congestion: Secondary | ICD-10-CM

## 2013-12-12 DIAGNOSIS — R609 Edema, unspecified: Secondary | ICD-10-CM

## 2013-12-12 HISTORY — DX: Varicose veins of unspecified lower extremity with ulcer of unspecified site: I83.009

## 2013-12-12 MED ORDER — PIOGLITAZONE HCL 30 MG PO TABS: 30.0000 mg | ORAL_TABLET | Freq: Every day | ORAL | Status: DC

## 2013-12-12 NOTE — Assessment & Plan Note (Signed)
Pt hesitant to start gabapentin. Tried to reassure it would not increase sugars. If decides to start, advised to start bid.

## 2013-12-12 NOTE — Assessment & Plan Note (Signed)
Chronic h/o R>L pedal edema thought related to chronic venous insufficiency. This is also site of vein harvesting in past.  Pedal edema contributing to current venous insuff ulcer. Reviewed elevation of leg and compression stocking use. Will trial lower actos dose as well.

## 2013-12-12 NOTE — Addendum Note (Signed)
Addended by: Royann Shivers A on: 12/12/2013 03:08 PM   Modules accepted: Orders

## 2013-12-12 NOTE — Telephone Encounter (Signed)
Scheduled

## 2013-12-12 NOTE — Assessment & Plan Note (Addendum)
Discussed lower actos dose to 30mg , hopeful to help decrease pedal edema. Pt just filled 45mg  dose, advised to cut in half until runs out Will update Dr. Dwyane Dee of this change. Lab Results  Component Value Date   HGBA1C 6.6* 11/18/2013

## 2013-12-12 NOTE — Progress Notes (Signed)
BP 122/64  Pulse 76  Temp(Src) 98.1 F (36.7 C)  Wt 156 lb (70.761 kg)   CC: leg weeping  Subjective:    [name], female    DOB: 1927/09/29, 80 y.o.   MRN: [id number]  HPI: [name] is a 80 y.o. female presenting on 12/12/2013 for Leg is weeping fluid   Seen at [facility] yesterday on [address] for leg weeping. Started on some antibiotic bid for 1 wk for possible cellulitis. Unknown name.  2 nights ago noticed wet sock when arrived home. Yesterday morning noticed damp shoe. Endorses mild discomfort. Denies inciting trauma/falls.   Also worried about sinus infection.  Recently seen by endo - with med changes - started on gabapentin 100mg  tid and maxzide was changed to hctz 12.5mg  2/2 high normal potassium. Doesn't think she will start gabapentin, wants to finish maxzide prior to starting lower hctz dose  Relevant past medical, surgical, family and social history reviewed and updated as indicated.  Allergies and medications reviewed and updated. Current Outpatient Prescriptions on File Prior to Visit  Medication Sig  . ALPRAZolam (XANAX) 1 MG tablet TAKE 1/2 TABLET BY MOUTH 4 TIMES A DAY  . Ascorbic Acid (VITAMIN C PO) Take 1,000 mg by mouth daily.   Marland Kitchen aspirin 81 MG tablet Take 81 mg by mouth daily.   Marland Kitchen CALCIUM PO Take 600 mg by mouth daily.   . clotrimazole (LOTRIMIN) 1 % cream APPLY 2 (TWO) TIMES DAILY. FOR A MINIMUM OF 2 WEEKS  . Cyanocobalamin (VITAMIN B 12 PO) Take 1 tablet by mouth daily.  Marland Kitchen DEXILANT 30 MG capsule TAKE 1 CAPSULE BY MOUTH DAILY  . estradiol (ESTRACE) 0.5 MG tablet TAKE 1 TABLET BY MOUTH DAILY  . gabapentin (NEURONTIN) 100 MG capsule Take 1 capsule (100 mg total) by mouth 3 (three) times daily.  Marland Kitchen glucosamine-chondroitin 500-400 MG tablet Take 1 tablet by mouth daily.    . hydrochlorothiazide (MICROZIDE) 12.5 MG capsule Take 1 capsule (12.5 mg total) by mouth daily.  Marland Kitchen HYDROcodone-acetaminophen (NORCO/VICODIN) 5-325 MG per tablet  TAKE 1/2 TABLET BY MOUTH EVERY 6 HOURS AS NEEDED  . nitroGLYCERIN (NITROSTAT) 0.4 MG SL tablet Place 0.4 mg under the tongue every 5 (five) minutes as needed. For chest pain May take up to 3 doses, if pain persists call 911.  . nystatin-triamcinolone ointment (MYCOLOG)   . nystatin-triamcinolone ointment (MYCOLOG) APPLY TO AFFECTED AREA AS NEEDED  . ONE TOUCH ULTRA TEST test strip USE AS DIRECTED  . pravastatin (PRAVACHOL) 20 MG tablet TAKE 1 TABLET BY MOUTH DAILY.  . Probiotic Product (ALIGN) 4 MG CAPS Take 1 capsule by mouth daily.  . ramipril (ALTACE) 10 MG capsule TAKE 1 CAPSULE BY MOUTH EVERY DAY  . traZODone (DESYREL) 150 MG tablet Take 150 mg by mouth at bedtime.    . triamcinolone cream (KENALOG) 0.1 % APPLY TO AFFECTED AREAS TWICE A DAY ON LEGS  . VITAMIN E PO Take 1 tablet by mouth daily.   Marland Kitchen triamterene-hydrochlorothiazide (MAXZIDE-25) 37.5-25 MG per tablet    No current facility-administered medications on file prior to visit.    Review of Systems Per HPI unless specifically indicated above    Objective:    BP 122/64  Pulse 76  Temp(Src) 98.1 F (36.7 C)  Wt 156 lb (70.761 kg)  Physical Exam  Nursing note and vitals reviewed. Constitutional: She appears well-developed and well-nourished. No distress.  Musculoskeletal: She exhibits edema (2+ R, 1+ L).  2+  DP bilaterally Small shallow venous ulcer medial R leg superior to malleolus, minimal surrounding erythema  Skin: Skin is warm and dry. No rash noted.  Psychiatric: Her mood appears anxious.   Results for orders placed in visit on 11/18/13  HEMOGLOBIN A1C      Result Value Ref Range   Hemoglobin A1C 6.6 (*) 4.6 - 6.5 %  BASIC METABOLIC PANEL      Result Value Ref Range   Sodium 138  135 - 145 mEq/L   Potassium 5.0  3.5 - 5.1 mEq/L   Chloride 104  96 - 112 mEq/L   CO2 26  19 - 32 mEq/L   Glucose, Bld 94  70 - 99 mg/dL   BUN 31 (*) 6 - 23 mg/dL   Creatinine, Ser 1.1  0.4 - 1.2 mg/dL   Calcium 9.1  8.4 - 10.5  mg/dL   GFR 50.77 (*) >60.00 mL/min      Assessment & Plan:   Problem List Items Addressed This Visit   Venous ulcer of left leg - Primary     Small. Should heal on its own. Discussed wound care - rec vaseline + dressing changes daily. Update if not healing as expected. Good pedal pulses today.    Type II or unspecified type diabetes mellitus with neurological manifestations, not stated as uncontrolled      Discussed lower actos dose to 30mg , hopeful to help decrease pedal edema. Pt just filled 45mg  dose, advised to cut in half until runs out Will update Dr. Dwyane Dee of this change. Lab Results  Component Value Date   HGBA1C 6.6* 11/18/2013       Relevant Medications      pioglitazone (ACTOS) tablet   Sinus congestion     Pt will call me with abx name, to see if it will cover any simmering bacterial sinus process.    Polyneuropathy in diabetes(357.2)     Pt hesitant to start gabapentin. Tried to reassure it would not increase sugars. If decides to start, advised to start bid.    Pedal edema     Chronic h/o R>L pedal edema thought related to chronic venous insufficiency. This is also site of vein harvesting in past.  Pedal edema contributing to current venous insuff ulcer. Reviewed elevation of leg and compression stocking use. Will trial lower actos dose as well.        Follow up plan: Return if symptoms worsen or fail to improve.

## 2013-12-12 NOTE — Telephone Encounter (Signed)
Pt left v/m; pt was seen earlier and was to cb to [place] with med pt taking; Doxycycline Hyclate 100 mg.

## 2013-12-12 NOTE — Assessment & Plan Note (Signed)
Pt will call me with abx name, to see if it will cover any simmering bacterial sinus process.

## 2013-12-12 NOTE — Telephone Encounter (Signed)
Noted. plz notify patient - this should also help with any infection in sinuses.

## 2013-12-12 NOTE — Assessment & Plan Note (Signed)
Small. Should heal on its own. Discussed wound care - rec vaseline + dressing changes daily. Update if not healing as expected. Good pedal pulses today.

## 2013-12-12 NOTE — Progress Notes (Signed)
Pre visit review using our clinic review tool, if applicable. No additional management support is needed unless otherwise documented below in the visit note. 

## 2013-12-12 NOTE — Patient Instructions (Addendum)
Flu shot today. You have a venous ulcer on right leg that is weeping. Continue treating with small amount of vaseline and dressing changes daily. This should heal well. Keep legs elevated, continue daily compression stockings, and continue water pill (either maxzide or hydrochlorothiazide). Call me with name of antibiotic that was started. Let's decrease actos dose to 30mg  once daily (new dose sent to pharmacy). Cut current tablet in half until you run out. I will let Dr. Dwyane Dee know that I'm decreasing this dose.

## 2013-12-12 NOTE — Telephone Encounter (Signed)
Attempted to call patient. No answer, no machine. Will try again later.  

## 2013-12-13 NOTE — Telephone Encounter (Signed)
Patient notified

## 2013-12-17 ENCOUNTER — Ambulatory Visit: Payer: Medicare Other | Admitting: Family Medicine

## 2013-12-17 ENCOUNTER — Encounter: Payer: Self-pay | Admitting: Family Medicine

## 2013-12-17 ENCOUNTER — Ambulatory Visit (INDEPENDENT_AMBULATORY_CARE_PROVIDER_SITE_OTHER): Payer: Medicare Other | Admitting: Family Medicine

## 2013-12-17 VITALS — BP 126/70 | HR 84 | Temp 97.8°F | Resp 16 | Wt 156.8 lb

## 2013-12-17 DIAGNOSIS — I2510 Atherosclerotic heart disease of native coronary artery without angina pectoris: Secondary | ICD-10-CM

## 2013-12-17 DIAGNOSIS — I83009 Varicose veins of unspecified lower extremity with ulcer of unspecified site: Secondary | ICD-10-CM | POA: Diagnosis not present

## 2013-12-17 DIAGNOSIS — I83029 Varicose veins of left lower extremity with ulcer of unspecified site: Secondary | ICD-10-CM

## 2013-12-17 DIAGNOSIS — R609 Edema, unspecified: Secondary | ICD-10-CM | POA: Diagnosis not present

## 2013-12-17 DIAGNOSIS — L97929 Non-pressure chronic ulcer of unspecified part of left lower leg with unspecified severity: Principal | ICD-10-CM

## 2013-12-17 DIAGNOSIS — R600 Localized edema: Secondary | ICD-10-CM

## 2013-12-17 DIAGNOSIS — L97909 Non-pressure chronic ulcer of unspecified part of unspecified lower leg with unspecified severity: Secondary | ICD-10-CM

## 2013-12-17 MED ORDER — FUROSEMIDE 20 MG PO TABS: 10.0000 mg | ORAL_TABLET | Freq: Every day | ORAL | Status: DC | PRN

## 2013-12-17 NOTE — Assessment & Plan Note (Signed)
R leg possibly slightly worse than last week, but weight stable. Fluid retention contributing to slowed healing so will treat with short lasix course 10mg  daily for 3 days. Discussed lasix use - stop maxzide, take lasix 1/2 tablet for 3 days then stop and restart lower hctz 12.5mg  dose. May use lasix 1/2 tab prn leg swelling. Pt seemed to understand instructions.

## 2013-12-17 NOTE — Progress Notes (Signed)
Pre visit review using our clinic review tool, if applicable. No additional management support is needed unless otherwise documented below in the visit note. 

## 2013-12-17 NOTE — Progress Notes (Signed)
BP 126/70  Pulse 84  Temp(Src) 97.8 F (36.6 C) (Oral)  Resp 16  Wt 156 lb 12.8 oz (71.124 kg)  SpO2 98%   CC: f/u leg wound  Subjective:    [name], female    DOB: 28-Mar-1928, 80 y.o.   MRN: [id number]  HPI: [name] is a 80 y.o. female presenting on 12/17/2013 for leg wound follow up   See prior note for details.  Seen here Thursday with venous shallow ulcer on right medial leg - pt stays worried because she has noticed persistent leg swelling and pain at right leg. She completed doxycycline course. She has been changing dressing daily with abx ointment  Wt Readings from Last 3 Encounters:  12/17/13 156 lb 12.8 oz (71.124 kg)  12/12/13 156 lb (70.761 kg)  11/18/13 154 lb 3.2 oz (69.945 kg)   Body mass index is 32.78 kg/(m^2).  Relevant past medical, surgical, family and social history reviewed and updated as indicated.  Allergies and medications reviewed and updated. Current Outpatient Prescriptions on File Prior to Visit  Medication Sig  . ALPRAZolam (XANAX) 1 MG tablet TAKE 1/2 TABLET BY MOUTH 4 TIMES A DAY  . Ascorbic Acid (VITAMIN C PO) Take 1,000 mg by mouth daily.   Marland Kitchen aspirin 81 MG tablet Take 81 mg by mouth daily.   Marland Kitchen CALCIUM PO Take 600 mg by mouth daily.   . clotrimazole (LOTRIMIN) 1 % cream APPLY 2 (TWO) TIMES DAILY. FOR A MINIMUM OF 2 WEEKS  . Cyanocobalamin (VITAMIN B 12 PO) Take 1 tablet by mouth daily.  Marland Kitchen DEXILANT 30 MG capsule TAKE 1 CAPSULE BY MOUTH DAILY  . estradiol (ESTRACE) 0.5 MG tablet TAKE 1 TABLET BY MOUTH DAILY  . gabapentin (NEURONTIN) 100 MG capsule Take 1 capsule (100 mg total) by mouth 3 (three) times daily.  Marland Kitchen glucosamine-chondroitin 500-400 MG tablet Take 1 tablet by mouth daily.    . hydrochlorothiazide (MICROZIDE) 12.5 MG capsule Take 1 capsule (12.5 mg total) by mouth daily.  Marland Kitchen HYDROcodone-acetaminophen (NORCO/VICODIN) 5-325 MG per tablet TAKE 1/2 TABLET BY MOUTH EVERY 6 HOURS AS NEEDED  . nitroGLYCERIN  (NITROSTAT) 0.4 MG SL tablet Place 0.4 mg under the tongue every 5 (five) minutes as needed. For chest pain May take up to 3 doses, if pain persists call 911.  . nystatin-triamcinolone ointment (MYCOLOG)   . nystatin-triamcinolone ointment (MYCOLOG) APPLY TO AFFECTED AREA AS NEEDED  . ONE TOUCH ULTRA TEST test strip USE AS DIRECTED  . pioglitazone (ACTOS) 30 MG tablet Take 1 tablet (30 mg total) by mouth daily.  . pravastatin (PRAVACHOL) 20 MG tablet TAKE 1 TABLET BY MOUTH DAILY.  . Probiotic Product (ALIGN) 4 MG CAPS Take 1 capsule by mouth daily.  . ramipril (ALTACE) 10 MG capsule TAKE 1 CAPSULE BY MOUTH EVERY DAY  . traZODone (DESYREL) 150 MG tablet Take 150 mg by mouth at bedtime.    . triamcinolone cream (KENALOG) 0.1 % APPLY TO AFFECTED AREAS TWICE A DAY ON LEGS  . VITAMIN E PO Take 1 tablet by mouth daily.    No current facility-administered medications on file prior to visit.    Review of Systems Per HPI unless specifically indicated above    Objective:    BP 126/70  Pulse 84  Temp(Src) 97.8 F (36.6 C) (Oral)  Resp 16  Wt 156 lb 12.8 oz (71.124 kg)  SpO2 98%  Physical Exam  Nursing note and vitals reviewed. Constitutional: She appears well-developed  and well-nourished. No distress.  Musculoskeletal: She exhibits edema (2+ R, tr L).  2+ DP bilaterally Small shallow venous ulcer medial R leg superior to malleolus, no surrounding erythema  Skin: Skin is warm and dry. No rash noted.  Psychiatric: Her mood appears anxious.       Assessment & Plan:   Problem List Items Addressed This Visit   Venous ulcer of left leg - Primary     Small, but doesn't seem to have closed at all over last 5 days. Pt remaining worried - and has difficulty with home dressings. Will place in duoderm hydrocolloid dressing as no evidence of infection today. Recheck in 6 days, sooner if needed. Pt agrees with plan    Pedal edema     R leg possibly slightly worse than last week, but weight  stable. Fluid retention contributing to slowed healing so will treat with short lasix course 10mg  daily for 3 days. Discussed lasix use - stop maxzide, take lasix 1/2 tablet for 3 days then stop and restart lower hctz 12.5mg  dose. May use lasix 1/2 tab prn leg swelling. Pt seemed to understand instructions.        Follow up plan: Return in about 6 days (around 12/23/2013), or if symptoms worsen or fail to improve, for follow up visit.

## 2013-12-17 NOTE — Assessment & Plan Note (Signed)
Small, but doesn't seem to have closed at all over last 5 days. Pt remaining worried - and has difficulty with home dressings. Will place in duoderm hydrocolloid dressing as no evidence of infection today. Recheck in 6 days, sooner if needed. Pt agrees with plan

## 2013-12-17 NOTE — Patient Instructions (Addendum)
Artificial skin to venous ulcer on right leg. Keep in place until it falls off or until you return to see me. For leg swelling - continue elevating legs, take 1/2 tablet of lasix (furosemide) for 3 days for leg swelling. Then start hydrochlorothiazide 12.5mg  daily. After this, take lasix (furosemide) as needed for leg swelling is worse Return to see me on Monday for recheck.

## 2013-12-23 ENCOUNTER — Encounter: Payer: Self-pay | Admitting: Family Medicine

## 2013-12-23 ENCOUNTER — Ambulatory Visit (INDEPENDENT_AMBULATORY_CARE_PROVIDER_SITE_OTHER): Payer: Medicare Other | Admitting: Family Medicine

## 2013-12-23 VITALS — BP 116/68 | HR 72 | Temp 98.0°F | Wt 157.5 lb

## 2013-12-23 DIAGNOSIS — R609 Edema, unspecified: Secondary | ICD-10-CM

## 2013-12-23 DIAGNOSIS — L97909 Non-pressure chronic ulcer of unspecified part of unspecified lower leg with unspecified severity: Secondary | ICD-10-CM

## 2013-12-23 DIAGNOSIS — I83029 Varicose veins of left lower extremity with ulcer of unspecified site: Secondary | ICD-10-CM

## 2013-12-23 DIAGNOSIS — M79609 Pain in unspecified limb: Secondary | ICD-10-CM | POA: Diagnosis not present

## 2013-12-23 DIAGNOSIS — I83009 Varicose veins of unspecified lower extremity with ulcer of unspecified site: Secondary | ICD-10-CM | POA: Diagnosis not present

## 2013-12-23 DIAGNOSIS — L97929 Non-pressure chronic ulcer of unspecified part of left lower leg with unspecified severity: Principal | ICD-10-CM

## 2013-12-23 DIAGNOSIS — M79671 Pain in right foot: Secondary | ICD-10-CM

## 2013-12-23 DIAGNOSIS — R600 Localized edema: Secondary | ICD-10-CM

## 2013-12-23 DIAGNOSIS — R109 Unspecified abdominal pain: Secondary | ICD-10-CM

## 2013-12-23 DIAGNOSIS — I2510 Atherosclerotic heart disease of native coronary artery without angina pectoris: Secondary | ICD-10-CM | POA: Diagnosis not present

## 2013-12-23 MED ORDER — DICLOFENAC SODIUM 1 % TD GEL: 1.0000 "application " | Freq: Three times a day (TID) | TRANSDERMAL | Status: DC

## 2013-12-23 NOTE — Assessment & Plan Note (Signed)
Continue lasix 10mg  daily for 3 days then change back to hctz 12.5mg  daily - just started lasix yesterday.

## 2013-12-23 NOTE — Assessment & Plan Note (Signed)
Slowly healing. Did not replace duoderm. Dressed with abx ointment and bandaid, advised to continue this daily.

## 2013-12-23 NOTE — Patient Instructions (Signed)
Let's stop aleve as I think this is causing abdominal discomfort from stomach irritation.  Continue hydrocodone as up to now. May try voltaren gel to foot (sent to pharmacy) Continue lasix 1/2 tablet for leg swelling. Let us know if not better with this. Continue daily dressing change to right inner leg with antibiotic and bandaid as discussed.

## 2013-12-23 NOTE — Progress Notes (Signed)
BP 116/68  Pulse 72  Temp(Src) 98 F (36.7 C) (Oral)  Wt 157 lb 8 oz (71.442 kg)  SpO2 98%   CC: f/u   Subjective:    [name], female    DOB: 01-19-1928, 80 y.o.   MRN: [id number]  HPI: TERELL LUBRANO is a 79 y.o. female presenting on 12/23/2013 for Follow-up and Abdominal Pain   See prior notes for details. Seen here 2 wks ago then again last week with venous shallow ulcer on right medial leg - she completed doxycycline course. Last week this was treated with duoderm hydrocolloid dressing.  For leg swelling she was started on lasix 10mg  daily for 3 days then return to hctz 12.5mg  daily.  abd pain for last week - attributes to stress. Describes diffuse abd discomfort with some nausea with food. No bowel movement changes, no fevers/chills.   R medial foot pain - may have twisted foot 3 wks ago.   Relevant past medical, surgical, family and social history reviewed and updated as indicated.  Allergies and medications reviewed and updated. Current Outpatient Prescriptions on File Prior to Visit  Medication Sig  . ALPRAZolam (XANAX) 1 MG tablet TAKE 1/2 TABLET BY MOUTH 4 TIMES A DAY  . Ascorbic Acid (VITAMIN C PO) Take 1,000 mg by mouth daily.   Marland Kitchen aspirin 81 MG tablet Take 81 mg by mouth daily.   Marland Kitchen CALCIUM PO Take 600 mg by mouth daily.   . clotrimazole (LOTRIMIN) 1 % cream APPLY 2 (TWO) TIMES DAILY. FOR A MINIMUM OF 2 WEEKS  . Cyanocobalamin (VITAMIN B 12 PO) Take 1 tablet by mouth daily.  Marland Kitchen DEXILANT 30 MG capsule TAKE 1 CAPSULE BY MOUTH DAILY  . estradiol (ESTRACE) 0.5 MG tablet TAKE 1 TABLET BY MOUTH DAILY  . furosemide (LASIX) 20 MG tablet Take 0.5 tablets (10 mg total) by mouth daily as needed for edema.  . gabapentin (NEURONTIN) 100 MG capsule Take 1 capsule (100 mg total) by mouth 3 (three) times daily.  Marland Kitchen glucosamine-chondroitin 500-400 MG tablet Take 1 tablet by mouth daily.    . hydrochlorothiazide (MICROZIDE) 12.5 MG capsule Take 1 capsule (12.5 mg  total) by mouth daily.  Marland Kitchen HYDROcodone-acetaminophen (NORCO/VICODIN) 5-325 MG per tablet TAKE 1/2 TABLET BY MOUTH EVERY 6 HOURS AS NEEDED  . nitroGLYCERIN (NITROSTAT) 0.4 MG SL tablet Place 0.4 mg under the tongue every 5 (five) minutes as needed. For chest pain May take up to 3 doses, if pain persists call 911.  . nystatin-triamcinolone ointment (MYCOLOG) APPLY TO AFFECTED AREA AS NEEDED  . ONE TOUCH ULTRA TEST test strip USE AS DIRECTED  . pioglitazone (ACTOS) 30 MG tablet Take 1 tablet (30 mg total) by mouth daily.  . pravastatin (PRAVACHOL) 20 MG tablet TAKE 1 TABLET BY MOUTH DAILY.  . Probiotic Product (ALIGN) 4 MG CAPS Take 1 capsule by mouth daily.  . ramipril (ALTACE) 10 MG capsule TAKE 1 CAPSULE BY MOUTH EVERY DAY  . traZODone (DESYREL) 150 MG tablet Take 150 mg by mouth at bedtime.    . triamcinolone cream (KENALOG) 0.1 % APPLY TO AFFECTED AREAS TWICE A DAY ON LEGS  . VITAMIN E PO Take 1 tablet by mouth daily.    No current facility-administered medications on file prior to visit.    Review of Systems Per HPI unless specifically indicated above    Objective:    BP 116/68  Pulse 72  Temp(Src) 98 F (36.7 C) (Oral)  Wt 157 lb  8 oz (71.442 kg)  SpO2 98%  Physical Exam  Nursing note and vitals reviewed. Constitutional: She appears well-developed and well-nourished. No distress.  HENT:  Mouth/Throat: Oropharynx is clear and moist. No oropharyngeal exudate.  Cardiovascular: Normal rate, regular rhythm, normal heart sounds and intact distal pulses.   No murmur heard. Pulmonary/Chest: Effort normal and breath sounds normal. No respiratory distress. She has no wheezes. She has no rales.  Abdominal: Soft. Normal appearance and bowel sounds are normal. She exhibits no distension and no mass. There is no hepatosplenomegaly. There is tenderness in the epigastric area. There is no rigidity, no rebound, no guarding and negative Murphy's sign.  Musculoskeletal: She exhibits edema  (chronic R>L).  No ligament laxity at R ankle, no navicular pain. Severe great toe hallux val gus deformity Healing medial R leg venous ulcer, no surrounding erythema  Skin: Skin is warm and dry. No rash noted.  Psychiatric: She has a normal mood and affect.   Results for orders placed in visit on 11/18/13  HEMOGLOBIN A1C      Result Value Ref Range   Hemoglobin A1C 6.6 (*) 4.6 - 6.5 %  BASIC METABOLIC PANEL      Result Value Ref Range   Sodium 138  135 - 145 mEq/L   Potassium 5.0  3.5 - 5.1 mEq/L   Chloride 104  96 - 112 mEq/L   CO2 26  19 - 32 mEq/L   Glucose, Bld 94  70 - 99 mg/dL   BUN 31 (*) 6 - 23 mg/dL   Creatinine, Ser 1.1  0.4 - 1.2 mg/dL   Calcium 9.1  8.4 - 10.5 mg/dL   GFR 50.77 (*) >60.00 mL/min      Assessment & Plan:   Problem List Items Addressed This Visit   Venous ulcer of right leg - Primary     Slowly healing. Did not replace duoderm. Dressed with abx ointment and bandaid, advised to continue this daily.    Pedal edema     Continue lasix 10mg  daily for 3 days then change back to hctz 12.5mg  daily - just started lasix yesterday.    Foot pain, right     ?foot sprain, not consistent with ankle sprain. rec voltaren gel (prescribed today). Update if not improved.    Abdominal  pain, other specified site     Anticipate related to worsening dyspepsia from NSAID use nightly. Advised stop aleve nightly.  Continue hydrocodone. Continue align. Not consistent with diverticulitis today.         Follow up plan: Return in about 1 month (around 01/22/2014), or as needed, for follow up visit.

## 2013-12-23 NOTE — Assessment & Plan Note (Signed)
Anticipate related to worsening dyspepsia from NSAID use nightly. Advised stop aleve nightly.  Continue hydrocodone. Continue align. Not consistent with diverticulitis today.

## 2013-12-23 NOTE — Assessment & Plan Note (Signed)
?  foot sprain, not consistent with ankle sprain. rec voltaren gel (prescribed today). Update if not improved.

## 2013-12-23 NOTE — Progress Notes (Signed)
Pre visit review using our clinic review tool, if applicable. No additional management support is needed unless otherwise documented below in the visit note. 

## 2013-12-24 ENCOUNTER — Other Ambulatory Visit: Payer: Self-pay

## 2013-12-24 MED ORDER — HYDROCODONE-ACETAMINOPHEN 5-325 MG PO TABS: ORAL_TABLET | ORAL | Status: DC

## 2013-12-24 NOTE — Telephone Encounter (Signed)
Pt left v/m requesting rx hydrocodone apap. Call when ready for pick up.  

## 2013-12-24 NOTE — Telephone Encounter (Signed)
Left msg letting pt know RX ready to be picked up.

## 2013-12-24 NOTE — Telephone Encounter (Signed)
printed and placed in [facility] box. 

## 2013-12-27 ENCOUNTER — Encounter: Payer: Self-pay | Admitting: Family Medicine

## 2013-12-27 ENCOUNTER — Other Ambulatory Visit: Payer: Medicare Other

## 2013-12-27 DIAGNOSIS — Z79899 Other long term (current) drug therapy: Secondary | ICD-10-CM | POA: Diagnosis not present

## 2013-12-30 ENCOUNTER — Ambulatory Visit (INDEPENDENT_AMBULATORY_CARE_PROVIDER_SITE_OTHER): Payer: Medicare Other | Admitting: Family Medicine

## 2013-12-30 ENCOUNTER — Encounter: Payer: Self-pay | Admitting: Family Medicine

## 2013-12-30 VITALS — BP 122/60 | HR 73 | Temp 98.1°F | Resp 14 | Wt 157.0 lb

## 2013-12-30 DIAGNOSIS — L97909 Non-pressure chronic ulcer of unspecified part of unspecified lower leg with unspecified severity: Secondary | ICD-10-CM

## 2013-12-30 DIAGNOSIS — M7989 Other specified soft tissue disorders: Secondary | ICD-10-CM | POA: Diagnosis not present

## 2013-12-30 DIAGNOSIS — I83009 Varicose veins of unspecified lower extremity with ulcer of unspecified site: Secondary | ICD-10-CM

## 2013-12-30 DIAGNOSIS — I2510 Atherosclerotic heart disease of native coronary artery without angina pectoris: Secondary | ICD-10-CM | POA: Diagnosis not present

## 2013-12-30 DIAGNOSIS — I83019 Varicose veins of right lower extremity with ulcer of unspecified site: Secondary | ICD-10-CM

## 2013-12-30 DIAGNOSIS — L97919 Non-pressure chronic ulcer of unspecified part of right lower leg with unspecified severity: Secondary | ICD-10-CM

## 2013-12-30 NOTE — Assessment & Plan Note (Addendum)
With persistent pain. Anticipate CVI related, but given marked swelling of R leg will check doppler to r/o DVT. Ordered today. Stop hctz, start higher lasix dose 20mg  daily. I asked her to bring all meds to next visit.

## 2013-12-30 NOTE — Patient Instructions (Addendum)
Pass by [facility]'s office for referral for ultrasound for next 1-2 days. Stop hydrochlorothiazide. Start lasix 1 full tablet daily (20mg ). Keep legs elevated. Continue your regular support stockings. Continue dressing changes once daily with antibiotic cream to right inner lower leg.

## 2013-12-30 NOTE — Progress Notes (Signed)
BP 122/60  Pulse 73  Temp(Src) 98.1 F (36.7 C) (Oral)  Resp 14  Wt 157 lb (71.215 kg)  SpO2 96%   CC: R leg pain  Subjective:    [name], female    DOB: 08-04-1927, 79 y.o.   MRN: [id number]  HPI: [name] is a 80 y.o. female presenting on 12/30/2013 for Follow-up   R leg pain medial lower leg along with some swelling. R ulcer slowly healing. Pain constant. Improved with walking. Worsened with prolonged standing.   Thinks she twisted R ankle and thinks she sprained ankle from this. Voltaren cream didn't help.  She has been taking HCTZ or lasix daily.   Past Medical History  Diagnosis Date  . Hypertension   . Coronary artery disease     CABG 1998  . Anxiety   . Diastolic dysfunction   . Hyperlipidemia   . PUD (peptic ulcer disease)   . Microcytic anemia   . Depression   . Diabetes mellitus     type 2, followed by endo.  . Diverticulosis   . Hemorrhoids   . History of heart attack   . Irritable bowel syndrome   . Chronic back pain 03/2012    lumbar DDD with herniation - s/p L S1 transforaminal ESI (10/2012 Dr. Sharlet Salina)  . Varicose veins   . Cystocele   . Hx of echocardiogram     a. Echo 10/13: mild LVH, EF 55-60%, Gr 1 diast dysfn, PASP 32   Past Surgical History  Procedure Laterality Date  . Tympanoplasty  1960s    R side  . Coronary artery bypass graft  1998  . Cataract surgery    . Cholecystectomy    . Appendectomy      per pt report  . Colonoscopy  02/2010    extensive diverticulosis throughout, small internal hemorrhoids, rec rpt 5 yrs (Dr. Allyn Kenner)  . Hand surgery    . Lexiscan myoview  03/2011    Small basal inferolateral and anterolateral reversible perfusion defect suggests ischemia.  EF was normal.   old infarct, no new ischemia  . Toe surgery    . Abdominal hysterectomy  1975    TAH,BSO  . Vault prolapse a & p repair  2009    Dr [facility] hospital  . Dexa scan  09/2011    femur -0.8, forearm 0.6 => normal  .  Esi  2014    L S1 transforaminal ESI x2 ([facility]) without improvement   Relevant past medical, surgical, family and social history reviewed and updated as indicated.  Allergies and medications reviewed and updated. Current Outpatient Prescriptions on File Prior to Visit  Medication Sig  . ALPRAZolam (XANAX) 1 MG tablet TAKE 1/2 TABLET BY MOUTH 4 TIMES A DAY  . Ascorbic Acid (VITAMIN C PO) Take 1,000 mg by mouth daily.   Marland Kitchen aspirin 81 MG tablet Take 81 mg by mouth daily.   Marland Kitchen CALCIUM PO Take 600 mg by mouth daily.   . clotrimazole (LOTRIMIN) 1 % cream APPLY 2 (TWO) TIMES DAILY. FOR A MINIMUM OF 2 WEEKS  . Cyanocobalamin (VITAMIN B 12 PO) Take 1 tablet by mouth daily.  Marland Kitchen DEXILANT 30 MG capsule TAKE 1 CAPSULE BY MOUTH DAILY  . diclofenac sodium (VOLTAREN) 1 % GEL Apply 1 application topically 3 (three) times daily.  Marland Kitchen estradiol (ESTRACE) 0.5 MG tablet TAKE 1 TABLET BY MOUTH DAILY  . gabapentin (NEURONTIN) 100 MG capsule Take 1 capsule (100 mg  total) by mouth 3 (three) times daily.  Marland Kitchen glucosamine-chondroitin 500-400 MG tablet Take 1 tablet by mouth daily.    Marland Kitchen HYDROcodone-acetaminophen (NORCO/VICODIN) 5-325 MG per tablet TAKE 1/2 TABLET BY MOUTH EVERY 6 HOURS AS NEEDED  . nitroGLYCERIN (NITROSTAT) 0.4 MG SL tablet Place 0.4 mg under the tongue every 5 (five) minutes as needed. For chest pain May take up to 3 doses, if pain persists call 911.  . nystatin-triamcinolone ointment (MYCOLOG) APPLY TO AFFECTED AREA AS NEEDED  . ONE TOUCH ULTRA TEST test strip USE AS DIRECTED  . pioglitazone (ACTOS) 30 MG tablet Take 1 tablet (30 mg total) by mouth daily.  . pravastatin (PRAVACHOL) 20 MG tablet TAKE 1 TABLET BY MOUTH DAILY.  . Probiotic Product (ALIGN) 4 MG CAPS Take 1 capsule by mouth daily.  . ramipril (ALTACE) 10 MG capsule TAKE 1 CAPSULE BY MOUTH EVERY DAY  . traZODone (DESYREL) 150 MG tablet Take 150 mg by mouth at bedtime.    . triamcinolone cream (KENALOG) 0.1 % APPLY TO AFFECTED AREAS TWICE A  DAY ON LEGS  . VITAMIN E PO Take 1 tablet by mouth daily.    No current facility-administered medications on file prior to visit.    Review of Systems Per HPI unless specifically indicated above    Objective:    BP 122/60  Pulse 73  Temp(Src) 98.1 F (36.7 C) (Oral)  Resp 14  Wt 157 lb (71.215 kg)  SpO2 96%  Physical Exam  Nursing note and vitals reviewed. Constitutional: She appears well-developed and well-nourished. No distress.  Musculoskeletal: She exhibits edema. Tenderness: R 1+ >L tr pitting.  R calf swelling > L Tender to palpation medial lower leg as well as medial midfoot. Closing R medial venous ulcer, mild clear drainage   Results for orders placed in visit on 11/18/13  HEMOGLOBIN A1C      Result Value Ref Range   Hemoglobin A1C 6.6 (*) 4.6 - 6.5 %  BASIC METABOLIC PANEL      Result Value Ref Range   Sodium 138  135 - 145 mEq/L   Potassium 5.0  3.5 - 5.1 mEq/L   Chloride 104  96 - 112 mEq/L   CO2 26  19 - 32 mEq/L   Glucose, Bld 94  70 - 99 mg/dL   BUN 31 (*) 6 - 23 mg/dL   Creatinine, Ser 1.1  0.4 - 1.2 mg/dL   Calcium 9.1  8.4 - 10.5 mg/dL   GFR 50.77 (*) >60.00 mL/min      Assessment & Plan:   Problem List Items Addressed This Visit   Venous ulcer of right leg     Slowly healing wound, continue abx cream and bandaid.    Right leg swelling - Primary     With persistent pain. Anticipate CVI related, but given marked swelling of R leg will check doppler to r/o DVT. Ordered today. Stop hctz, start higher lasix dose 20mg  daily. I asked her to bring all meds to next visit.    Relevant Orders      Lower Extremity Venous Duplex Right       Follow up plan: Return if symptoms worsen or fail to improve.

## 2013-12-30 NOTE — Progress Notes (Signed)
Pre visit review using our clinic review tool, if applicable. No additional management support is needed unless otherwise documented below in the visit note. 

## 2013-12-30 NOTE — Assessment & Plan Note (Signed)
Slowly healing wound, continue abx cream and bandaid.

## 2013-12-31 ENCOUNTER — Encounter: Payer: Self-pay | Admitting: Family Medicine

## 2013-12-31 ENCOUNTER — Ambulatory Visit (HOSPITAL_COMMUNITY): Payer: Medicare Other | Attending: Cardiology | Admitting: Cardiology

## 2013-12-31 ENCOUNTER — Telehealth: Payer: Self-pay

## 2013-12-31 DIAGNOSIS — M7989 Other specified soft tissue disorders: Secondary | ICD-10-CM | POA: Diagnosis not present

## 2013-12-31 DIAGNOSIS — I803 Phlebitis and thrombophlebitis of lower extremities, unspecified: Secondary | ICD-10-CM

## 2013-12-31 DIAGNOSIS — E785 Hyperlipidemia, unspecified: Secondary | ICD-10-CM | POA: Insufficient documentation

## 2013-12-31 DIAGNOSIS — M79604 Pain in right leg: Secondary | ICD-10-CM

## 2013-12-31 DIAGNOSIS — Z87891 Personal history of nicotine dependence: Secondary | ICD-10-CM | POA: Insufficient documentation

## 2013-12-31 DIAGNOSIS — M79609 Pain in unspecified limb: Secondary | ICD-10-CM | POA: Diagnosis not present

## 2013-12-31 DIAGNOSIS — I2510 Atherosclerotic heart disease of native coronary artery without angina pectoris: Secondary | ICD-10-CM | POA: Diagnosis not present

## 2013-12-31 DIAGNOSIS — I10 Essential (primary) hypertension: Secondary | ICD-10-CM | POA: Insufficient documentation

## 2013-12-31 DIAGNOSIS — E119 Type 2 diabetes mellitus without complications: Secondary | ICD-10-CM | POA: Insufficient documentation

## 2013-12-31 DIAGNOSIS — Z951 Presence of aortocoronary bypass graft: Secondary | ICD-10-CM | POA: Diagnosis not present

## 2013-12-31 MED ORDER — RIVAROXABAN 15 MG PO TABS: 15.0000 mg | ORAL_TABLET | Freq: Two times a day (BID) | ORAL | Status: DC

## 2013-12-31 NOTE — Telephone Encounter (Addendum)
Please call vascular lab back at [phone].   Patient has a DVT.  Would start xarelto.  Take it twice a day for now. Further rx to come through PCP.  Have her schedule f/u with Dr. Darnell Level soon.  If any bleeding, then notify clinic. If SOB or CP, to ER. Thanks.  rx sent.

## 2013-12-31 NOTE — Telephone Encounter (Signed)
Gina with Vascular lab called report positive for DVT. Dr Darnell Level not in office.

## 2013-12-31 NOTE — Progress Notes (Signed)
Right lower venous duplex performed  Results given to Dr.Duncan, patient instructed to take Xarelto and has appointment on 01/02/2014.

## 2013-12-31 NOTE — Telephone Encounter (Signed)
Spoke to patient while she at the vascular lab and advised her as instructed. Follow-up appointment scheduled with Dr. Darnell Level Thursday 01/02/13. Patient agreed to appointment and time.

## 2014-01-01 ENCOUNTER — Encounter: Payer: Self-pay | Admitting: Family Medicine

## 2014-01-02 ENCOUNTER — Encounter: Payer: Self-pay | Admitting: Family Medicine

## 2014-01-02 ENCOUNTER — Ambulatory Visit (INDEPENDENT_AMBULATORY_CARE_PROVIDER_SITE_OTHER): Payer: Medicare Other | Admitting: Family Medicine

## 2014-01-02 VITALS — BP 122/56 | HR 71 | Temp 98.0°F | Wt 158.5 lb

## 2014-01-02 DIAGNOSIS — I824Z1 Acute embolism and thrombosis of unspecified deep veins of right distal lower extremity: Secondary | ICD-10-CM | POA: Diagnosis not present

## 2014-01-02 DIAGNOSIS — I87311 Chronic venous hypertension (idiopathic) with ulcer of right lower extremity: Secondary | ICD-10-CM

## 2014-01-02 DIAGNOSIS — R600 Localized edema: Secondary | ICD-10-CM | POA: Diagnosis not present

## 2014-01-02 DIAGNOSIS — I83019 Varicose veins of right lower extremity with ulcer of unspecified site: Secondary | ICD-10-CM

## 2014-01-02 DIAGNOSIS — I2510 Atherosclerotic heart disease of native coronary artery without angina pectoris: Secondary | ICD-10-CM | POA: Diagnosis not present

## 2014-01-02 DIAGNOSIS — S91102A Unspecified open wound of left great toe without damage to nail, initial encounter: Secondary | ICD-10-CM | POA: Diagnosis not present

## 2014-01-02 DIAGNOSIS — L97919 Non-pressure chronic ulcer of unspecified part of right lower leg with unspecified severity: Secondary | ICD-10-CM

## 2014-01-02 MED ORDER — HYDROCHLOROTHIAZIDE 12.5 MG PO CAPS: 12.5000 mg | ORAL_CAPSULE | Freq: Every day | ORAL | Status: DC

## 2014-01-02 NOTE — Patient Instructions (Signed)
Antibiotic ointment daily with dressing change to left toe.  For blood clot - continue xarelto 15mg  twice daily for 3 weeks total, then start 20mg  once daily. Return in 2.5 weeks for follow up of blood clot. I want [place] to slowly start decreasing estradiol - 1 tablet alternating with 1/2 tablet daily. We will do this for the next several weeks and reassess at next visit.

## 2014-01-02 NOTE — Assessment & Plan Note (Signed)
Acute thrombus of R proximal gastrocnemius vein. Symptomatic provoked distal DVT as pt on estrogen for hot flashes. Failed taper off estrogen in the past - but again discussed reasons to retry taper and pt agrees - will alternate 0.5mg  with 0.25mg  estradiol daily. Continue xarelto 15mg  bid for 3 wks then transition to 20mg  daily (around 01/22/2014). RTC 2.5 wks for f/u visit.

## 2014-01-02 NOTE — Assessment & Plan Note (Signed)
Stop lasix for now, restart hctz 12.5mg  daily.

## 2014-01-02 NOTE — Progress Notes (Signed)
Pre visit review using our clinic review tool, if applicable. No additional management support is needed unless otherwise documented below in the visit note. 

## 2014-01-02 NOTE — Progress Notes (Signed)
BP 122/56  Pulse 71  Temp(Src) 98 F (36.7 C) (Oral)  Wt 158 lb 8 oz (71.895 kg)  SpO2 98%   CC: R leg DVT  Subjective:    [name], female    DOB: Jan 18, 1928, 80 y.o.   MRN: [id number]  HPI: [name] is a 80 y.o. female presenting on 01/02/2014 for Follow-up and check left toe (possible infection)   Recently found acute thrombus of R proximal gastrocnemius vein. Symptomatic provoked distal DVT. Due to estrogen. She is on estradiol 0.5mg  daily and refuses to come off this medication 2/2 return of hot flashes.  Recently had hangnail L great toe she decided to remove. Now oozing erythematous wound medial great toe.   Lab Results  Component Value Date   CREATININE 1.1 11/18/2013    Relevant past medical, surgical, family and social history reviewed and updated as indicated.  Allergies and medications reviewed and updated. Current Outpatient Prescriptions on File Prior to Visit  Medication Sig  . ALPRAZolam (XANAX) 1 MG tablet TAKE 1/2 TABLET BY MOUTH 4 TIMES A DAY  . Ascorbic Acid (VITAMIN C PO) Take 1,000 mg by mouth daily.   Marland Kitchen aspirin 81 MG tablet Take 81 mg by mouth daily.   Marland Kitchen CALCIUM PO Take 600 mg by mouth daily.   . Cyanocobalamin (VITAMIN B 12 PO) Take 1 tablet by mouth daily.  Marland Kitchen DEXILANT 30 MG capsule TAKE 1 CAPSULE BY MOUTH DAILY  . estradiol (ESTRACE) 0.5 MG tablet TAKE 1 TABLET BY MOUTH DAILY  . glucosamine-chondroitin 500-400 MG tablet Take 1 tablet by mouth daily.    Marland Kitchen HYDROcodone-acetaminophen (NORCO/VICODIN) 5-325 MG per tablet TAKE 1/2 TABLET BY MOUTH EVERY 6 HOURS AS NEEDED  . nitroGLYCERIN (NITROSTAT) 0.4 MG SL tablet Place 0.4 mg under the tongue every 5 (five) minutes as needed. For chest pain May take up to 3 doses, if pain persists call 911.  . nystatin-triamcinolone ointment (MYCOLOG) APPLY TO AFFECTED AREA AS NEEDED  . ONE TOUCH ULTRA TEST test strip USE AS DIRECTED  . pioglitazone (ACTOS) 30 MG tablet Take 1 tablet (30 mg  total) by mouth daily.  . pravastatin (PRAVACHOL) 20 MG tablet TAKE 1 TABLET BY MOUTH DAILY.  . Probiotic Product (ALIGN) 4 MG CAPS Take 1 capsule by mouth daily.  . ramipril (ALTACE) 10 MG capsule TAKE 1 CAPSULE BY MOUTH EVERY DAY  . Rivaroxaban (XARELTO) 15 MG TABS tablet Take 1 tablet (15 mg total) by mouth 2 (two) times daily with a meal.  . traZODone (DESYREL) 150 MG tablet Take 150 mg by mouth at bedtime.    Marland Kitchen VITAMIN E PO Take 1 tablet by mouth daily.   . clotrimazole (LOTRIMIN) 1 % cream APPLY 2 (TWO) TIMES DAILY. FOR A MINIMUM OF 2 WEEKS  . diclofenac sodium (VOLTAREN) 1 % GEL Apply 1 application topically 3 (three) times daily.  Marland Kitchen gabapentin (NEURONTIN) 100 MG capsule Take 1 capsule (100 mg total) by mouth 3 (three) times daily.  Marland Kitchen triamcinolone cream (KENALOG) 0.1 % APPLY TO AFFECTED AREAS TWICE A DAY ON LEGS   No current facility-administered medications on file prior to visit.    Review of Systems Per HPI unless specifically indicated above    Objective:    BP 122/56  Pulse 71  Temp(Src) 98 F (36.7 C) (Oral)  Wt 158 lb 8 oz (71.895 kg)  SpO2 98%  Physical Exam  Nursing note and vitals reviewed. Constitutional: She appears well-developed and well-nourished.  No distress.  Musculoskeletal: She exhibits edema (R>L).  Skin: Skin is warm and dry.  Small venous ulcer R medial lower leg Medial L great toe with granulation tissue and mild surrounding erythema at area where patient clipped skin off   Results for orders placed in visit on 11/18/13  HEMOGLOBIN A1C      Result Value Ref Range   Hemoglobin A1C 6.6 (*) 4.6 - 6.5 %  BASIC METABOLIC PANEL      Result Value Ref Range   Sodium 138  135 - 145 mEq/L   Potassium 5.0  3.5 - 5.1 mEq/L   Chloride 104  96 - 112 mEq/L   CO2 26  19 - 32 mEq/L   Glucose, Bld 94  70 - 99 mg/dL   BUN 31 (*) 6 - 23 mg/dL   Creatinine, Ser 1.1  0.4 - 1.2 mg/dL   Calcium 9.1  8.4 - 10.5 mg/dL   GFR 50.77 (*) >60.00 mL/min  L great toe  cleaned with alcohol and dressed with abx ointment and bandage    Assessment & Plan:   Problem List Items Addressed This Visit   Venous ulcer of right leg     Continue current treatment plan.    Pedal edema     Stop lasix for now, restart hctz 12.5mg  daily.    Open wound of left great toe     Advised against clipping/cutting her own skin. Treat with abx ointment and dressing daily. Recheck in 1 wk, discussed red flags to seek care prior (ie streaking redness or worsening pain.)    Acute deep vein thrombosis (DVT) of distal vein of right lower extremity - Primary     Acute thrombus of R proximal gastrocnemius vein. Symptomatic provoked distal DVT as pt on estrogen for hot flashes. Failed taper off estrogen in the past - but again discussed reasons to retry taper and pt agrees - will alternate 0.5mg  with 0.25mg  estradiol daily. Continue xarelto 15mg  bid for 3 wks then transition to 20mg  daily (around 01/22/2014). RTC 2.5 wks for f/u visit.    Relevant Medications      hydrochlorothiazide (MICROZIDE) 12.5 MG capsule       Follow up plan: Return in about 3 weeks (around 01/20/2014), or if symptoms worsen or fail to improve, for follow up visit.

## 2014-01-02 NOTE — Assessment & Plan Note (Signed)
Continue current treatment plan. 

## 2014-01-02 NOTE — Assessment & Plan Note (Signed)
Advised against clipping/cutting her own skin. Treat with abx ointment and dressing daily. Recheck in 1 wk, discussed red flags to seek care prior (ie streaking redness or worsening pain.)

## 2014-01-04 ENCOUNTER — Other Ambulatory Visit: Payer: Self-pay | Admitting: Family Medicine

## 2014-01-05 ENCOUNTER — Emergency Department (HOSPITAL_COMMUNITY): Payer: Medicare Other

## 2014-01-05 ENCOUNTER — Encounter (HOSPITAL_COMMUNITY): Payer: Self-pay | Admitting: Emergency Medicine

## 2014-01-05 ENCOUNTER — Emergency Department (HOSPITAL_COMMUNITY)
Admission: EM | Admit: 2014-01-05 | Discharge: 2014-01-05 | Disposition: A | Discharge status: Home / Self Care | Payer: Medicare Other | Attending: Emergency Medicine | Admitting: Emergency Medicine

## 2014-01-05 DIAGNOSIS — R110 Nausea: Secondary | ICD-10-CM | POA: Diagnosis not present

## 2014-01-05 DIAGNOSIS — R0602 Shortness of breath: Secondary | ICD-10-CM | POA: Insufficient documentation

## 2014-01-05 DIAGNOSIS — I2510 Atherosclerotic heart disease of native coronary artery without angina pectoris: Secondary | ICD-10-CM | POA: Insufficient documentation

## 2014-01-05 DIAGNOSIS — I10 Essential (primary) hypertension: Secondary | ICD-10-CM | POA: Diagnosis not present

## 2014-01-05 DIAGNOSIS — E119 Type 2 diabetes mellitus without complications: Secondary | ICD-10-CM | POA: Insufficient documentation

## 2014-01-05 DIAGNOSIS — Z7982 Long term (current) use of aspirin: Secondary | ICD-10-CM | POA: Insufficient documentation

## 2014-01-05 DIAGNOSIS — Z951 Presence of aortocoronary bypass graft: Secondary | ICD-10-CM | POA: Insufficient documentation

## 2014-01-05 DIAGNOSIS — Z7901 Long term (current) use of anticoagulants: Secondary | ICD-10-CM | POA: Insufficient documentation

## 2014-01-05 DIAGNOSIS — R600 Localized edema: Secondary | ICD-10-CM | POA: Diagnosis not present

## 2014-01-05 DIAGNOSIS — Z792 Long term (current) use of antibiotics: Secondary | ICD-10-CM | POA: Insufficient documentation

## 2014-01-05 DIAGNOSIS — F419 Anxiety disorder, unspecified: Secondary | ICD-10-CM | POA: Diagnosis not present

## 2014-01-05 DIAGNOSIS — Z880 Allergy status to penicillin: Secondary | ICD-10-CM | POA: Insufficient documentation

## 2014-01-05 DIAGNOSIS — Z79899 Other long term (current) drug therapy: Secondary | ICD-10-CM | POA: Insufficient documentation

## 2014-01-05 DIAGNOSIS — E785 Hyperlipidemia, unspecified: Secondary | ICD-10-CM | POA: Diagnosis not present

## 2014-01-05 DIAGNOSIS — I519 Heart disease, unspecified: Secondary | ICD-10-CM | POA: Diagnosis not present

## 2014-01-05 DIAGNOSIS — R079 Chest pain, unspecified: Secondary | ICD-10-CM | POA: Diagnosis not present

## 2014-01-05 DIAGNOSIS — Z86718 Personal history of other venous thrombosis and embolism: Secondary | ICD-10-CM | POA: Diagnosis not present

## 2014-01-05 DIAGNOSIS — Z8719 Personal history of other diseases of the digestive system: Secondary | ICD-10-CM | POA: Insufficient documentation

## 2014-01-05 DIAGNOSIS — Z87891 Personal history of nicotine dependence: Secondary | ICD-10-CM | POA: Insufficient documentation

## 2014-01-05 DIAGNOSIS — Z8742 Personal history of other diseases of the female genital tract: Secondary | ICD-10-CM | POA: Diagnosis not present

## 2014-01-05 DIAGNOSIS — D509 Iron deficiency anemia, unspecified: Secondary | ICD-10-CM | POA: Diagnosis not present

## 2014-01-05 DIAGNOSIS — B351 Tinea unguium: Secondary | ICD-10-CM | POA: Insufficient documentation

## 2014-01-05 DIAGNOSIS — R6889 Other general symptoms and signs: Secondary | ICD-10-CM | POA: Diagnosis not present

## 2014-01-05 DIAGNOSIS — F329 Major depressive disorder, single episode, unspecified: Secondary | ICD-10-CM | POA: Diagnosis not present

## 2014-01-05 DIAGNOSIS — R609 Edema, unspecified: Secondary | ICD-10-CM

## 2014-01-05 DIAGNOSIS — R0789 Other chest pain: Secondary | ICD-10-CM | POA: Insufficient documentation

## 2014-01-05 DIAGNOSIS — Z9289 Personal history of other medical treatment: Secondary | ICD-10-CM | POA: Diagnosis not present

## 2014-01-05 DIAGNOSIS — I252 Old myocardial infarction: Secondary | ICD-10-CM | POA: Diagnosis not present

## 2014-01-05 DIAGNOSIS — I83891 Varicose veins of right lower extremities with other complications: Secondary | ICD-10-CM | POA: Insufficient documentation

## 2014-01-05 DIAGNOSIS — G8929 Other chronic pain: Secondary | ICD-10-CM | POA: Diagnosis not present

## 2014-01-05 DIAGNOSIS — R42 Dizziness and giddiness: Secondary | ICD-10-CM | POA: Diagnosis not present

## 2014-01-05 LAB — CBC WITH DIFFERENTIAL/PLATELET
BASOS ABS: 0.1 10*3/uL (ref 0.0–0.1)
Basophils Relative: 1 % (ref 0–1)
EOS PCT: 2 % (ref 0–5)
Eosinophils Absolute: 0.3 10*3/uL (ref 0.0–0.7)
HCT: 31 % — ABNORMAL LOW (ref 36.0–46.0)
HEMOGLOBIN: 9.9 g/dL — AB (ref 12.0–15.0)
Lymphocytes Relative: 12 % (ref 12–46)
Lymphs Abs: 1.2 10*3/uL (ref 0.7–4.0)
MCH: 27.7 pg (ref 26.0–34.0)
MCHC: 31.9 g/dL (ref 30.0–36.0)
MCV: 86.8 fL (ref 78.0–100.0)
MONOS PCT: 7 % (ref 3–12)
Monocytes Absolute: 0.7 10*3/uL (ref 0.1–1.0)
Neutro Abs: 8 10*3/uL — ABNORMAL HIGH (ref 1.7–7.7)
Neutrophils Relative %: 78 % — ABNORMAL HIGH (ref 43–77)
Platelets: 266 10*3/uL (ref 150–400)
RBC: 3.57 MIL/uL — ABNORMAL LOW (ref 3.87–5.11)
RDW: 15.1 % (ref 11.5–15.5)
WBC: 10.2 10*3/uL (ref 4.0–10.5)

## 2014-01-05 LAB — TYPE AND SCREEN
ABO/RH(D): O POS
Antibody Screen: NEGATIVE

## 2014-01-05 LAB — BASIC METABOLIC PANEL
ANION GAP: 10 (ref 5–15)
BUN: 30 mg/dL — ABNORMAL HIGH (ref 6–23)
CALCIUM: 8.7 mg/dL (ref 8.4–10.5)
CO2: 26 mEq/L (ref 19–32)
CREATININE: 0.81 mg/dL (ref 0.50–1.10)
Chloride: 103 mEq/L (ref 96–112)
GFR calc Af Amer: 75 mL/min — ABNORMAL LOW (ref >90)
GFR calc non Af Amer: 65 mL/min — ABNORMAL LOW (ref >90)
GLUCOSE: 139 mg/dL — AB (ref 70–99)
Potassium: 4.1 mEq/L (ref 3.7–5.3)
Sodium: 139 mEq/L (ref 137–147)

## 2014-01-05 LAB — I-STAT TROPONIN, ED
TROPONIN I, POC: 0 ng/mL (ref 0.00–0.08)
Troponin i, poc: 0 ng/mL (ref 0.00–0.08)

## 2014-01-05 LAB — PROTIME-INR
INR: 2.12 — ABNORMAL HIGH (ref 0.00–1.49)
Prothrombin Time: 23.7 seconds — ABNORMAL HIGH (ref 11.6–15.2)

## 2014-01-05 LAB — APTT: APTT: 33 s (ref 24–37)

## 2014-01-05 LAB — URINALYSIS, ROUTINE W REFLEX MICROSCOPIC
Bilirubin Urine: NEGATIVE
Glucose, UA: NEGATIVE mg/dL
Hgb urine dipstick: NEGATIVE
Ketones, ur: NEGATIVE mg/dL
LEUKOCYTES UA: NEGATIVE
NITRITE: NEGATIVE
PH: 5.5 (ref 5.0–8.0)
Protein, ur: NEGATIVE mg/dL
SPECIFIC GRAVITY, URINE: 1.03 (ref 1.005–1.030)
Urobilinogen, UA: 0.2 mg/dL (ref 0.0–1.0)

## 2014-01-05 LAB — HEMOGLOBIN AND HEMATOCRIT, BLOOD
HCT: 29.9 % — ABNORMAL LOW (ref 36.0–46.0)
Hemoglobin: 9.6 g/dL — ABNORMAL LOW (ref 12.0–15.0)

## 2014-01-05 LAB — ABO/RH: ABO/RH(D): O POS

## 2014-01-05 MED ORDER — SODIUM CHLORIDE 0.9 % IV SOLN
INTRAVENOUS | Status: DC
  Administered 2014-01-05: 07:00:00 via INTRAVENOUS

## 2014-01-05 MED ORDER — IOHEXOL 350 MG/ML SOLN
80.0000 mL | Freq: Once | INTRAVENOUS | Status: AC | PRN
  Administered 2014-01-05: 80 mL via INTRAVENOUS

## 2014-01-05 NOTE — ED Notes (Signed)
Ct called, informed them of new iv placed that was neccessary for ct scan

## 2014-01-05 NOTE — ED Provider Notes (Signed)
Reevaluation after testing complete. Repeat hemoglobin is slightly less than the first one. Patient is asymptomatic for bleeding, but complains of leakage from her right leg. She states that her PCP, prescribed, furosemide, to use for leg edema, but she is not taking it. She cannot tolerate compression stockings. She has no further complaints.   Legs- bilateral, peripheral edema. Drainage of clear fluid, consistent with edema, and source. Wound that was bleeding earlier on the right medial ankle is not bleeding currently.  Ct Angio Chest W/cm &/or Wo Cm  01/05/2014   CLINICAL DATA:  Chest pain, shortness of breath, recently dx with DVT in right lower extremity Hx: HTN, CAD, DM, MI, Chronic back pain, CABG, Cholecystectomy, Hysterectomy  EXAM: CT ANGIOGRAPHY CHEST WITH CONTRAST  TECHNIQUE: Multidetector CT imaging of the chest was performed using the standard protocol during bolus administration of intravenous contrast. Multiplanar CT image reconstructions and MIPs were obtained to evaluate the vascular anatomy.  CONTRAST:  35mL OMNIPAQUE IOHEXOL 350 MG/ML SOLN  COMPARISON:  None.  FINDINGS: Satisfactory opacification of pulmonary arteries noted, and there is no evidence of pulmonary emboli. Main pulmonary artery 3.1 cm diameter. Patent superior and inferior pulmonary veins bilaterally. Mitral annulus calcifications. Patchy aortic calcifications. Previous CABG. Adequate contrast opacification of the thoracic aorta with no evidence of dissection, aneurysm, or stenosis. There is classic 3-vessel brachiocephalic arch anatomy without proximal stenosis. Patchy calcified plaque in the aortic arch and descending segment with some eccentric nonocclusive mural thrombus. Visualized portions of proximal abdominal aorta show similar mild atheromatous change.  No pleural or pericardial effusion. Subcentimeter anterior mediastinal, pretracheal, and pre carinal lymph nodes. Minimal linear subsegmental atelectasis or scarring  in basilar segments of the left lower lobe. Lungs are otherwise clear. Spurring and degenerative disc disease at several levels in the mid and lower thoracic spine. Visualized portions of upper abdomen unremarkable.  Review of the MIP images confirms the above findings.  IMPRESSION: 1. Negative for acute PE or thoracic aortic dissection.   Electronically Signed   By: Arne Cleveland M.D.   On: 01/05/2014 09:07    Results for orders placed during the hospital encounter of 01/05/14  CBC WITH DIFFERENTIAL      Result Value Ref Range   WBC 10.2  4.0 - 10.5 K/uL   RBC 3.57 (*) 3.87 - 5.11 MIL/uL   Hemoglobin 9.9 (*) 12.0 - 15.0 g/dL   HCT 31.0 (*) 36.0 - 46.0 %   MCV 86.8  78.0 - 100.0 fL   MCH 27.7  26.0 - 34.0 pg   MCHC 31.9  30.0 - 36.0 g/dL   RDW 15.1  11.5 - 15.5 %   Platelets 266  150 - 400 K/uL   Neutrophils Relative % 78 (*) 43 - 77 %   Neutro Abs 8.0 (*) 1.7 - 7.7 K/uL   Lymphocytes Relative 12  12 - 46 %   Lymphs Abs 1.2  0.7 - 4.0 K/uL   Monocytes Relative 7  3 - 12 %   Monocytes Absolute 0.7  0.1 - 1.0 K/uL   Eosinophils Relative 2  0 - 5 %   Eosinophils Absolute 0.3  0.0 - 0.7 K/uL   Basophils Relative 1  0 - 1 %   Basophils Absolute 0.1  0.0 - 0.1 K/uL  BASIC METABOLIC PANEL      Result Value Ref Range   Sodium 139  137 - 147 mEq/L   Potassium 4.1  3.7 - 5.3 mEq/L   Chloride 103  96 - 112 mEq/L   CO2 26  19 - 32 mEq/L   Glucose, Bld 139 (*) 70 - 99 mg/dL   BUN 30 (*) 6 - 23 mg/dL   Creatinine, Ser 0.81  0.50 - 1.10 mg/dL   Calcium 8.7  8.4 - 10.5 mg/dL   GFR calc non Af Amer 65 (*) >90 mL/min   GFR calc Af Amer 75 (*) >90 mL/min   Anion gap 10  5 - 15  APTT      Result Value Ref Range   aPTT 33  24 - 37 seconds  PROTIME-INR      Result Value Ref Range   Prothrombin Time 23.7 (*) 11.6 - 15.2 seconds   INR 2.12 (*) 0.00 - 1.49  URINALYSIS, ROUTINE W REFLEX MICROSCOPIC      Result Value Ref Range   Color, Urine YELLOW  YELLOW   APPearance CLEAR  CLEAR   Specific  Gravity, Urine 1.030  1.005 - 1.030   pH 5.5  5.0 - 8.0   Glucose, UA NEGATIVE  NEGATIVE mg/dL   Hgb urine dipstick NEGATIVE  NEGATIVE   Bilirubin Urine NEGATIVE  NEGATIVE   Ketones, ur NEGATIVE  NEGATIVE mg/dL   Protein, ur NEGATIVE  NEGATIVE mg/dL   Urobilinogen, UA 0.2  0.0 - 1.0 mg/dL   Nitrite NEGATIVE  NEGATIVE   Leukocytes, UA NEGATIVE  NEGATIVE  HEMOGLOBIN AND HEMATOCRIT, BLOOD      Result Value Ref Range   Hemoglobin 9.6 (*) 12.0 - 15.0 g/dL   HCT 29.9 (*) 36.0 - 46.0 %  I-STAT TROPOININ, ED      Result Value Ref Range   Troponin i, poc 0.00  0.00 - 0.08 ng/mL   Comment 3           I-STAT TROPOININ, ED      Result Value Ref Range   Troponin i, poc 0.00  0.00 - 0.08 ng/mL   Comment 3           TYPE AND SCREEN      Result Value Ref Range   ABO/RH(D) O POS     Antibody Screen NEG     Sample Expiration 01/08/2014      Nursing Notes Reviewed/ Care Coordinated Applicable Imaging Reviewed Interpretation of Laboratory Data incorporated into ED treatment  Assessment: Leg bleeding, associated with varicosity, and peripheral edema. No significant blood loss. No evidence for PE. She is stable for discharge with outpatient management.  Plan: Restart Lasix, as soon as possible. Elevate legs above heart, as much as possible. Use compression directly on the wound for further episodes of bleeding. Followup with PCP in 3 days for reevaluation.  Richarda Blade, MD 01/05/14 [phone]

## 2014-01-05 NOTE — ED Provider Notes (Signed)
TIME SEEN: 5:35 AM  CHIEF COMPLAINT: Chest pain, shortness of breath, bleeding from her right lower extremity  HPI: Patient is a 80 y.o. F with history of hypertension, coronary artery disease status post CABG in 1998, diabetes, hyperlipidemia, recent diagnosis of an acute DVT in the right lower extremity currently on anticoagulation the patient is unclear what she is taking presents emergency department the episode of bleeding from her right lower extremity. 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 She states that she was on her feet for several hours and had some increased swelling of her right lower extremity. She reports that when she got home and took off or close she scratched the inside of the right lower extremity and there was a small area that began bleeding. She reports the bleeding was "squirting" and that there was "a lot of blood". She states that the blood made her feel very lightheaded, nauseous and short of breath. When EMS arrived, patient had a systolic blood pressure of 70. Upon my evaluation, patient does report she has some chest heaviness but no shortness of breath, nausea, diaphoresis or dizziness. She denies recent fevers or cough. She lives at home with her boyfriend and another female friend. She states that her son lives next to her and checks on her regularly.    ROS: See HPI Constitutional: no fever  Eyes: no drainage  ENT: no runny nose   Cardiovascular:   chest pain  Resp:  SOB  GI: no vomiting GU: no dysuria Integumentary: no rash  Allergy: no hives  Musculoskeletal: no leg swelling  Neurological: no slurred speech ROS otherwise negative  PAST MEDICAL HISTORY/PAST SURGICAL HISTORY:  Past Medical History  Diagnosis Date  . Hypertension   . Coronary artery disease     CABG 1998  . Anxiety   . Diastolic dysfunction   . Hyperlipidemia   . PUD (peptic ulcer disease)   . Microcytic anemia   . Depression   .  Diabetes mellitus     type 2, followed by endo.  . Diverticulosis   . Hemorrhoids   . History of heart attack   . Irritable bowel syndrome   . Chronic back pain 03/2012    lumbar DDD with herniation - s/p L S1 transforaminal ESI (10/2012 Dr. Sharlet Salina)  . Varicose veins   . Cystocele   . Hx of echocardiogram     a. Echo 10/13: mild LVH, EF 55-60%, Gr 1 diast dysfn, PASP 32  . Acute deep vein thrombosis (DVT) of distal vein of right lower extremity 12/2013    Acute thrombus of R proximal gastrocnemius vein. Symptomatic provoked distal DVT    MEDICATIONS:  Prior to Admission medications   Medication Sig Start Date End Date Taking? Authorizing Provider  ALPRAZolam Duanne Moron) 1 MG tablet TAKE 1/2 TABLET BY MOUTH 4 TIMES A DAY 04/26/13   Ria Bush, MD  Ascorbic Acid (VITAMIN C PO) Take 1,000 mg by mouth daily.     Historical Provider, MD  aspirin 81 MG tablet Take 81 mg by mouth daily.     Historical Provider, MD  CALCIUM PO Take 600 mg by mouth daily.     Historical Provider, MD  clotrimazole (LOTRIMIN) 1 % cream APPLY 2 (TWO) TIMES DAILY. FOR A MINIMUM OF 2 WEEKS 05/08/13   Ria Bush, MD  Cyanocobalamin (VITAMIN B 12 PO) Take 1 tablet by mouth daily.    Historical Provider, MD  DEXILANT 30 MG capsule  TAKE 1 CAPSULE BY MOUTH DAILY 07/11/13   Ria Bush, MD  diclofenac sodium (VOLTAREN) 1 % GEL Apply 1 application topically 3 (three) times daily. 12/23/13   Ria Bush, MD  estradiol (ESTRACE) 0.5 MG tablet alternate 1/2 with 1 tab daily 09/20/13   Ria Bush, MD  gabapentin (NEURONTIN) 100 MG capsule Take 1 capsule (100 mg total) by mouth 3 (three) times daily. 11/18/13   Elayne Snare, MD  glucosamine-chondroitin 500-400 MG tablet Take 1 tablet by mouth daily.      Historical Provider, MD  hydrochlorothiazide (MICROZIDE) 12.5 MG capsule Take 1 capsule (12.5 mg total) by mouth daily. 01/02/14   Ria Bush, MD  HYDROcodone-acetaminophen (NORCO/VICODIN) 5-325 MG per tablet  TAKE 1/2 TABLET BY MOUTH EVERY 6 HOURS AS NEEDED 12/24/13   Ria Bush, MD  nitroGLYCERIN (NITROSTAT) 0.4 MG SL tablet Place 0.4 mg under the tongue every 5 (five) minutes as needed. For chest pain May take up to 3 doses, if pain persists call 911. 03/24/11   Thayer Headings, MD  nystatin-triamcinolone ointment (MYCOLOG) APPLY TO AFFECTED AREA AS NEEDED 12/02/13   Anastasio Auerbach, MD  ONE TOUCH ULTRA TEST test strip USE AS DIRECTED 07/02/13   Ria Bush, MD  pioglitazone (ACTOS) 30 MG tablet Take 1 tablet (30 mg total) by mouth daily. 12/12/13   Ria Bush, MD  pravastatin (PRAVACHOL) 20 MG tablet TAKE 1 TABLET BY MOUTH DAILY.    Ria Bush, MD  Probiotic Product (ALIGN) 4 MG CAPS Take 1 capsule by mouth daily. 08/23/12   Ria Bush, MD  ramipril (ALTACE) 10 MG capsule TAKE 1 CAPSULE BY MOUTH EVERY DAY 09/06/13   Ria Bush, MD  Rivaroxaban (XARELTO) 15 MG TABS tablet Take 1 tablet (15 mg total) by mouth 2 (two) times daily with a meal. 12/31/13   Tonia Ghent, MD  traZODone (DESYREL) 150 MG tablet Take 150 mg by mouth at bedtime.      Historical Provider, MD  triamcinolone cream (KENALOG) 0.1 % APPLY TO AFFECTED AREAS TWICE A DAY ON LEGS 09/19/13   Ria Bush, MD  VITAMIN E PO Take 1 tablet by mouth daily.     Historical Provider, MD    ALLERGIES:  Allergies  Allergen Reactions  . Amoxicillin-Pot Clavulanate Diarrhea  . Metformin And Related Diarrhea    SOCIAL HISTORY:  History  Substance Use Topics  . Smoking status: Former Research scientist (life sciences)  . Smokeless tobacco: Never Used  . Alcohol Use: No    FAMILY HISTORY: Family History  Problem Relation Age of Onset  . Hypertension Mother   . Stroke Mother   . Diabetes Father   . Diabetes Sister   . Diabetes Brother   . Colon cancer Neg Hx     EXAM: BP 100/52  Pulse 75  Temp(Src) 97.7 F (36.5 C) (Oral)  Ht 4\' 10"  (1.473 m)  Wt 158 lb (71.668 kg)  BMI 33.03 kg/m2  SpO2 99% CONSTITUTIONAL: Alert and  oriented and responds appropriately to questions. Well-appearing; well-nourished, pleasant, smiling, in no distress HEAD: Normocephalic EYES: Conjunctivae clear, PERRL ENT: normal nose; no rhinorrhea; moist mucous membranes; pharynx without lesions noted NECK: Supple, no meningismus, no LAD  CARD: RRR; S1 and S2 appreciated; no murmurs, no clicks, no rubs, no gallops RESP: Normal chest excursion without splinting or tachypnea; breath sounds clear and equal bilaterally; no wheezes, no rhonchi, no rales,  ABD/GI: Normal bowel sounds; non-distended; soft, non-tender, no rebound, no guarding BACK:  The back appears normal and  is non-tender to palpation, there is no CVA tenderness EXT: Right lower extremity is swollen and tender to palpation compared to left but there is no skin discoloration, equal pulses in bilateral lower extremities, edema noted in the right lower extremity but none in the left, no active bleeding currently. Normal ROM in all joints; otherwise extremities are non-tender to palpation; normal capillary refill; no cyanosis; onychomycosis in bilateral toenails    SKIN: Normal color for age and race; warm NEURO: Moves all extremities equally, sensation to light touch intact diffusely PSYCH: The patient's mood and manner are appropriate. Grooming and personal hygiene are appropriate.  MEDICAL DECISION MAKING: Patient here with episode of bleeding from her right lower extremity. She currently is on anticoagulation but is unable to tell us what she is taking. This may be Xarelto. Bleeding has currently stopped with direct pressure. There is no lesions noted on her lower extremities. She did have an episode of shortness of breath, nausea and dizziness which may have been vasovagal but given she is having some chest pain currently, will obtain EKG, troponin, CT of her chest to rule out pulmonary embolus. We'll continue to closely monitor and give IV fluids.  ED PROGRESS: Patient's low pressure  continues to improve and she has not had any episodes of hypotension in the ED. Her hemoglobin is 9.9 today. Last hemoglobin on record was 10.8 in October 2014. We'll repeat hemoglobin to make sure there is no significant drop. Her first troponin is also negative. We'll repeat a second troponin as patient reports she would like to be discharged home. CT of her chest is pending. Signed out to Dr. Eulis Foster who will followup on patient's labs, imaging.       EKG Interpretation  Date/Time:  Sunday January 05 2014 06:46:56 EDT Ventricular Rate:  75 PR Interval:  186 QRS Duration: 96 QT Interval:  404 QTC Calculation: 451 R Axis:   67 Text Interpretation:  Sinus rhythm Confirmed by Daleon Willinger,  DO, Madysun Thall [id number]) on 01/05/2014 7:14:37 AM        [place], DO 01/05/14 [id number]

## 2014-01-05 NOTE — ED Notes (Signed)
Pt is from home. 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 Pt states that she noticed one of the vericose veins on her leg was bleeding. Pt states that she had a small wound on this same vein a few weeks ago. Pt also with redness and swelling to the right leg. Pt states that she had an ultrasound of the right leg on Thursday, and was diagnosed with a blood clot. Pt states that she does not take a blood thinner, but Xarelto is on her medication list on her chart. Upon EMS arrival, pt was sitting on the toilet, feeling dizzy and lightheaded, with a palpated BP of 70. Last BP with EMS was 112/60. Pt also with wounds on bilat great toes.

## 2014-01-05 NOTE — ED Notes (Signed)
Patient transported to CT 

## 2014-01-05 NOTE — ED Notes (Addendum)
Still awaiting IV and Creat level for CTA PE @ [phone]  -Patient has 22g by EMS in [facility], needs a 20g in upper forearm/AC for CTA

## 2014-01-05 NOTE — Discharge Instructions (Signed)
Elevate your legs, above your heart as much is possible. Start taking your furosemide for the fluid, in your legs. If the leg bleeds again, use direct pressure on the bleeding site to stop the bleeding.    Edema Edema is an abnormal buildup of fluids in your bodytissues. Edema is somewhatdependent on gravity to pull the fluid to the lowest place in your body. That makes the condition more common in the legs and thighs (lower extremities). Painless swelling of the feet and ankles is common and becomes more likely as you get older. It is also common in looser tissues, like around your eyes.  When the affected area is squeezed, the fluid may move out of that spot and leave a dent for a few moments. This dent is called pitting.  CAUSES  There are many possible causes of edema. Eating too much salt and being on your feet or sitting for a long time can cause edema in your legs and ankles. Hot weather may make edema worse. Common medical causes of edema include:  Heart failure.  Liver disease.  Kidney disease.  Weak blood vessels in your legs.  Cancer.  An injury.  Pregnancy.  Some medications.  Obesity. SYMPTOMS  Edema is usually painless.Your skin may look swollen or shiny.  DIAGNOSIS  Your health care provider may be able to diagnose edema by asking about your medical history and doing a physical exam. You may need to have tests such as X-rays, an electrocardiogram, or blood tests to check for medical conditions that may cause edema.  TREATMENT  Edema treatment depends on the cause. If you have heart, liver, or kidney disease, you need the treatment appropriate for these conditions. General treatment may include:  Elevation of the affected body part above the level of your heart.  Compression of the affected body part. Pressure from elastic bandages or support stockings squeezes the tissues and forces fluid back into the blood vessels. This keeps fluid from entering the  tissues.  Restriction of fluid and salt intake.  Use of a water pill (diuretic). These medications are appropriate only for some types of edema. They pull fluid out of your body and make you urinate more often. This gets rid of fluid and reduces swelling, but diuretics can have side effects. Only use diuretics as directed by your health care provider. HOME CARE INSTRUCTIONS   Keep the affected body part above the level of your heart when you are lying down.   Do not sit still or stand for prolonged periods.   Do not put anything directly under your knees when lying down.  Do not wear constricting clothing or garters on your upper legs.   Exercise your legs to work the fluid back into your blood vessels. This may help the swelling go down.   Wear elastic bandages or support stockings to reduce ankle swelling as directed by your health care provider.   Eat a low-salt diet to reduce fluid if your health care provider recommends it.   Only take medicines as directed by your health care provider. SEEK MEDICAL CARE IF:   Your edema is not responding to treatment.  You have heart, liver, or kidney disease and notice symptoms of edema.  You have edema in your legs that does not improve after elevating them.   You have sudden and unexplained weight gain. SEEK IMMEDIATE MEDICAL CARE IF:   You develop shortness of breath or chest pain.   You cannot breathe when you lie  down.  You develop pain, redness, or warmth in the swollen areas.   You have heart, liver, or kidney disease and suddenly get edema.  You have a fever and your symptoms suddenly get worse. MAKE SURE YOU:   Understand these instructions.  Will watch your condition.  Will get help right away if you are not doing well or get worse. Document Released: 03/21/2005 Document Revised: 08/05/2013 Document Reviewed: 01/11/2013 [facility] Patient Information 2015 [place], [place]. This information is not intended to  replace advice given to you by your health care provider. Make sure you discuss any questions you have with your health care provider.

## 2014-01-05 NOTE — ED Notes (Signed)
Notified RN that we are only awaiting a 2og AC IV access to proceed with CTA PE.

## 2014-01-06 ENCOUNTER — Encounter: Payer: BLUE CROSS/BLUE SHIELD | Admitting: Gynecology

## 2014-01-06 ENCOUNTER — Ambulatory Visit (INDEPENDENT_AMBULATORY_CARE_PROVIDER_SITE_OTHER): Payer: Medicare Other | Admitting: Family Medicine

## 2014-01-06 ENCOUNTER — Encounter: Payer: Self-pay | Admitting: Family Medicine

## 2014-01-06 VITALS — BP 114/64 | HR 84 | Temp 98.4°F | Wt 156.5 lb

## 2014-01-06 DIAGNOSIS — I2510 Atherosclerotic heart disease of native coronary artery without angina pectoris: Secondary | ICD-10-CM | POA: Diagnosis not present

## 2014-01-06 DIAGNOSIS — E1149 Type 2 diabetes mellitus with other diabetic neurological complication: Secondary | ICD-10-CM

## 2014-01-06 DIAGNOSIS — D509 Iron deficiency anemia, unspecified: Secondary | ICD-10-CM

## 2014-01-06 DIAGNOSIS — I83019 Varicose veins of right lower extremity with ulcer of unspecified site: Secondary | ICD-10-CM

## 2014-01-06 DIAGNOSIS — N951 Menopausal and female climacteric states: Secondary | ICD-10-CM

## 2014-01-06 DIAGNOSIS — E1142 Type 2 diabetes mellitus with diabetic polyneuropathy: Secondary | ICD-10-CM

## 2014-01-06 DIAGNOSIS — I87311 Chronic venous hypertension (idiopathic) with ulcer of right lower extremity: Secondary | ICD-10-CM | POA: Diagnosis not present

## 2014-01-06 DIAGNOSIS — I83891 Varicose veins of right lower extremities with other complications: Secondary | ICD-10-CM

## 2014-01-06 DIAGNOSIS — I824Z1 Acute embolism and thrombosis of unspecified deep veins of right distal lower extremity: Secondary | ICD-10-CM

## 2014-01-06 DIAGNOSIS — R232 Flushing: Secondary | ICD-10-CM

## 2014-01-06 DIAGNOSIS — L97919 Non-pressure chronic ulcer of unspecified part of right lower leg with unspecified severity: Secondary | ICD-10-CM

## 2014-01-06 LAB — CBC WITH DIFFERENTIAL/PLATELET
Basophils Absolute: 0.1 10*3/uL (ref 0.0–0.1)
Basophils Relative: 0.7 % (ref 0.0–3.0)
Eosinophils Absolute: 0.1 10*3/uL (ref 0.0–0.7)
Eosinophils Relative: 1 % (ref 0.0–5.0)
HCT: 29.9 % — ABNORMAL LOW (ref 36.0–46.0)
Hemoglobin: 9.6 g/dL — ABNORMAL LOW (ref 12.0–15.0)
Lymphocytes Relative: 14.7 % (ref 12.0–46.0)
Lymphs Abs: 1.6 10*3/uL (ref 0.7–4.0)
MCHC: 32 g/dL (ref 30.0–36.0)
MCV: 88 fl (ref 78.0–100.0)
Monocytes Absolute: 0.8 10*3/uL (ref 0.1–1.0)
Monocytes Relative: 7.5 % (ref 3.0–12.0)
Neutro Abs: 8 10*3/uL — ABNORMAL HIGH (ref 1.4–7.7)
Neutrophils Relative %: 76.1 % (ref 43.0–77.0)
Platelets: 258 10*3/uL (ref 150.0–400.0)
RBC: 3.4 Mil/uL — ABNORMAL LOW (ref 3.87–5.11)
RDW: 16.3 % — ABNORMAL HIGH (ref 11.5–15.5)
WBC: 10.5 10*3/uL (ref 4.0–10.5)

## 2014-01-06 MED ORDER — RAMIPRIL 5 MG PO CAPS: ORAL_CAPSULE | ORAL | Status: DC

## 2014-01-06 NOTE — Progress Notes (Signed)
Pre visit review using our clinic review tool, if applicable. No additional management support is needed unless otherwise documented below in the visit note. 

## 2014-01-06 NOTE — Assessment & Plan Note (Signed)
Recheck Hgb today given recent drop from 9.9->9.6 and recent commencement of xarelto along with her daily aspirin 81mg 

## 2014-01-06 NOTE — Assessment & Plan Note (Signed)
Persistent but no evidence of infection. Dressed wound with triple abx ointment and gauze, covered with ace wrap.

## 2014-01-06 NOTE — Progress Notes (Signed)
BP 114/64  Pulse 84  Temp(Src) 98.4 F (36.9 C) (Oral)  Wt 156 lb 8 oz (70.988 kg)   CC: recheck leg wound  Subjective:    [name], female    DOB: 31-Aug-1927, 80 y.o.   MRN: [id number]  HPI: [name] is a 80 y.o. female presenting on 01/06/2014 for Varicose Veins   Here after acutely seen at ER yesterday for bleed from R leg varicose vein. ER records reviewed. Had some chest pain as well - TnI normal x2, CTA chest neg for PE. She also had some hypotension noted.  Hgb 9.9->9.6, last year Hgb 10.8.   She does not tolerate compression stockings.   Somewhat confused about meds, very anxious. Worried to start gabapentin prescribed by endo, taking hctz 12.5mg  daily, is not taking lasix.   Relevant past medical, surgical, family and social history reviewed and updated as indicated.  Allergies and medications reviewed and updated. Current Outpatient Prescriptions on File Prior to Visit  Medication Sig  . ALPRAZolam (XANAX) 1 MG tablet Take 0.5 mg by mouth 4 (four) times daily.  . Ascorbic Acid (VITAMIN C PO) Take 1,000 mg by mouth daily.   Marland Kitchen aspirin EC 81 MG tablet Take 81 mg by mouth daily.  Marland Kitchen CALCIUM PO Take 600 mg by mouth daily.   . Cyanocobalamin (VITAMIN B 12 PO) Take 1 tablet by mouth daily.  Marland Kitchen DEXILANT 30 MG capsule TAKE 1 CAPSULE BY MOUTH DAILY  . estradiol (ESTRACE) 0.5 MG tablet Take 0.5 mg by mouth as directed. 1 pill every other day; 0.5 pill every other day  . glucosamine-chondroitin 500-400 MG tablet Take 1 tablet by mouth daily.    . hydrochlorothiazide (MICROZIDE) 12.5 MG capsule Take 12.5 mg by mouth daily.  Marland Kitchen HYDROcodone-acetaminophen (NORCO/VICODIN) 5-325 MG per tablet Take 0.5-1 tablets by mouth every 6 (six) hours as needed for moderate pain.  . nitroGLYCERIN (NITROSTAT) 0.4 MG SL tablet Place 0.4 mg under the tongue every 5 (five) minutes as needed. For chest pain May take up to 3 doses, if pain persists call 911.  . pravastatin  (PRAVACHOL) 20 MG tablet Take 20 mg by mouth daily.  . Rivaroxaban (XARELTO) 15 MG TABS tablet Take 15 mg by mouth 2 (two) times daily with a meal.  . traZODone (DESYREL) 150 MG tablet Take 150 mg by mouth at bedtime.    . triamcinolone cream (KENALOG) 0.1 % Apply 1 application topically daily.  Marland Kitchen VITAMIN E PO Take 1 tablet by mouth daily.    No current facility-administered medications on file prior to visit.    Review of Systems Per HPI unless specifically indicated above    Objective:    BP 114/64  Pulse 84  Temp(Src) 98.4 F (36.9 C) (Oral)  Wt 156 lb 8 oz (70.988 kg)  Physical Exam  Nursing note and vitals reviewed. Constitutional: She appears well-developed and well-nourished. No distress.  Musculoskeletal: She exhibits edema (R>L).  Some skin breakdown medial right lower leg with several small ulcers at previous site of 1 ulcer, no surrounding erythema or induration Punctate scab medial to ulcers - likely source of bleed. Now closed.  Skin: Skin is warm and dry. No rash noted.  Ecchymoses at sites of recent IVs  Psychiatric: Her mood appears anxious.   Results for orders placed during the hospital encounter of 01/05/14  CBC WITH DIFFERENTIAL      Result Value Ref Range   WBC 10.2  4.0 - 10.5  K/uL   RBC 3.57 (*) 3.87 - 5.11 MIL/uL   Hemoglobin 9.9 (*) 12.0 - 15.0 g/dL   HCT 31.0 (*) 36.0 - 46.0 %   MCV 86.8  78.0 - 100.0 fL   MCH 27.7  26.0 - 34.0 pg   MCHC 31.9  30.0 - 36.0 g/dL   RDW 15.1  11.5 - 15.5 %   Platelets 266  150 - 400 K/uL   Neutrophils Relative % 78 (*) 43 - 77 %   Neutro Abs 8.0 (*) 1.7 - 7.7 K/uL   Lymphocytes Relative 12  12 - 46 %   Lymphs Abs 1.2  0.7 - 4.0 K/uL   Monocytes Relative 7  3 - 12 %   Monocytes Absolute 0.7  0.1 - 1.0 K/uL   Eosinophils Relative 2  0 - 5 %   Eosinophils Absolute 0.3  0.0 - 0.7 K/uL   Basophils Relative 1  0 - 1 %   Basophils Absolute 0.1  0.0 - 0.1 K/uL  BASIC METABOLIC PANEL      Result Value Ref Range    Sodium 139  137 - 147 mEq/L   Potassium 4.1  3.7 - 5.3 mEq/L   Chloride 103  96 - 112 mEq/L   CO2 26  19 - 32 mEq/L   Glucose, Bld 139 (*) 70 - 99 mg/dL   BUN 30 (*) 6 - 23 mg/dL   Creatinine, Ser 0.81  0.50 - 1.10 mg/dL   Calcium 8.7  8.4 - 10.5 mg/dL   GFR calc non Af Amer 65 (*) >90 mL/min   GFR calc Af Amer 75 (*) >90 mL/min   Anion gap 10  5 - 15  APTT      Result Value Ref Range   aPTT 33  24 - 37 seconds  PROTIME-INR      Result Value Ref Range   Prothrombin Time 23.7 (*) 11.6 - 15.2 seconds   INR 2.12 (*) 0.00 - 1.49  URINALYSIS, ROUTINE W REFLEX MICROSCOPIC      Result Value Ref Range   Color, Urine YELLOW  YELLOW   APPearance CLEAR  CLEAR   Specific Gravity, Urine 1.030  1.005 - 1.030   pH 5.5  5.0 - 8.0   Glucose, UA NEGATIVE  NEGATIVE mg/dL   Hgb urine dipstick NEGATIVE  NEGATIVE   Bilirubin Urine NEGATIVE  NEGATIVE   Ketones, ur NEGATIVE  NEGATIVE mg/dL   Protein, ur NEGATIVE  NEGATIVE mg/dL   Urobilinogen, UA 0.2  0.0 - 1.0 mg/dL   Nitrite NEGATIVE  NEGATIVE   Leukocytes, UA NEGATIVE  NEGATIVE  HEMOGLOBIN AND HEMATOCRIT, BLOOD      Result Value Ref Range   Hemoglobin 9.6 (*) 12.0 - 15.0 g/dL   HCT 29.9 (*) 36.0 - 46.0 %  I-STAT TROPOININ, ED      Result Value Ref Range   Troponin i, poc 0.00  0.00 - 0.08 ng/mL   Comment 3           I-STAT TROPOININ, ED      Result Value Ref Range   Troponin i, poc 0.00  0.00 - 0.08 ng/mL   Comment 3           TYPE AND SCREEN      Result Value Ref Range   ABO/RH(D) O POS     Antibody Screen NEG     Sample Expiration 01/08/2014    ABO/RH      Result Value Ref Range  ABO/RH(D) O POS        Assessment & Plan:   Problem List Items Addressed This Visit   Venous ulcer of right leg     Persistent but no evidence of infection. Dressed wound with triple abx ointment and gauze, covered with ace wrap.    Microcytic anemia     Recheck Hgb today given recent drop from 9.9->9.6 and recent commencement of xarelto along with  her daily aspirin 81mg     Relevant Orders      CBC with Differential   Hot flashes     Tolerating slower taper off estradiol. Consider continuing taper next week.    Diabetic polyneuropathy     Again discussed ok to start gabapentin - may start one pill nightly to ensure tolerated well.    Relevant Medications      pioglitazone (ACTOS) 30 MG tablet      ramipril (ALTACE) capsule   Bleeding from varicose vein - Primary     Now has resolved.    Relevant Medications      ramipril (ALTACE) capsule   Acute deep vein thrombosis (DVT) of distal vein of right lower extremity     Continue xarelto 15mg  bid with transition to 20mg  daily dose around 01/22/2014 (or sooner if concern for bleed).    Relevant Medications      ramipril (ALTACE) capsule       Follow up plan: Return in about 1 week (around 01/13/2014), or if symptoms worsen or fail to improve, for follow up visit.

## 2014-01-06 NOTE — Assessment & Plan Note (Signed)
Again discussed ok to start gabapentin - may start one pill nightly to ensure tolerated well.

## 2014-01-06 NOTE — Assessment & Plan Note (Signed)
Now has resolved.

## 2014-01-06 NOTE — Patient Instructions (Addendum)
May try gabapentin 100mg  nightly. Continue xarelto twice a day for now. Continue elevating leg.  Continue dressing changes every other day if able. Continue hydrochlorothiazide 12.5mg  daily. Decrease ramipril dose to 5mg  once daily. Blood work today.

## 2014-01-06 NOTE — Assessment & Plan Note (Signed)
Tolerating slower taper off estradiol. Consider continuing taper next week.

## 2014-01-06 NOTE — Assessment & Plan Note (Signed)
Continue xarelto 15mg  bid with transition to 20mg  daily dose around 01/22/2014 (or sooner if concern for bleed).

## 2014-01-07 ENCOUNTER — Encounter: Payer: Self-pay | Admitting: *Deleted

## 2014-01-08 ENCOUNTER — Telehealth: Payer: Self-pay | Admitting: Family Medicine

## 2014-01-08 ENCOUNTER — Ambulatory Visit (INDEPENDENT_AMBULATORY_CARE_PROVIDER_SITE_OTHER): Payer: Medicare Other | Admitting: Internal Medicine

## 2014-01-08 ENCOUNTER — Encounter: Payer: Self-pay | Admitting: Internal Medicine

## 2014-01-08 VITALS — BP 118/58 | HR 71 | Temp 98.2°F | Wt 155.0 lb

## 2014-01-08 DIAGNOSIS — I2510 Atherosclerotic heart disease of native coronary artery without angina pectoris: Secondary | ICD-10-CM

## 2014-01-08 DIAGNOSIS — I872 Venous insufficiency (chronic) (peripheral): Secondary | ICD-10-CM | POA: Diagnosis not present

## 2014-01-08 DIAGNOSIS — L97909 Non-pressure chronic ulcer of unspecified part of unspecified lower leg with unspecified severity: Secondary | ICD-10-CM | POA: Diagnosis not present

## 2014-01-08 NOTE — Progress Notes (Signed)
Subjective:    [name], female    DOB: 01-18-1928, 80 y.o.   MRN: [id number]  HPI  Pt presents to the clinic today to follow up RLE varcose vein ulcer. She was seen for the same 2 days ago by Dr. Darnell Level. The area was covered with triple antibiotic ointment and guaze. She reports the area was throbbing last night. She did change the dressing this am and noticed pus on the bandage. She has not run fevers. She has not noticed any redness around the ulcer.  Review of Systems      Past Medical History  Diagnosis Date  . Hypertension   . Coronary artery disease     CABG 1998  . Anxiety   . Diastolic dysfunction   . Hyperlipidemia   . PUD (peptic ulcer disease)   . Microcytic anemia   . Depression   . Diabetes mellitus     type 2, followed by endo.  . Diverticulosis   . Hemorrhoids   . History of heart attack   . Irritable bowel syndrome   . Chronic back pain 03/2012    lumbar DDD with herniation - s/p L S1 transforaminal ESI (10/2012 Dr. Sharlet Salina)  . Varicose veins   . Cystocele   . Hx of echocardiogram     a. Echo 10/13: mild LVH, EF 55-60%, Gr 1 diast dysfn, PASP 32  . Acute deep vein thrombosis (DVT) of distal vein of right lower extremity 12/2013    Acute thrombus of R proximal gastrocnemius vein. Symptomatic provoked distal DVT    Current Outpatient Prescriptions  Medication Sig Dispense Refill  . ALPRAZolam (XANAX) 1 MG tablet Take 0.5 mg by mouth 4 (four) times daily.      . Ascorbic Acid (VITAMIN C PO) Take 1,000 mg by mouth daily.       Marland Kitchen aspirin EC 81 MG tablet Take 81 mg by mouth daily.      Marland Kitchen CALCIUM PO Take 600 mg by mouth daily.       . Cyanocobalamin (VITAMIN B 12 PO) Take 1 tablet by mouth daily.      Marland Kitchen DEXILANT 30 MG capsule TAKE 1 CAPSULE BY MOUTH DAILY  30 capsule  5  . estradiol (ESTRACE) 0.5 MG tablet Take 0.5 mg by mouth as directed. 1 pill every other day; 0.5 pill every other day      . gabapentin (NEURONTIN) 100 MG capsule Take 100 mg  by mouth at bedtime.      Marland Kitchen glucosamine-chondroitin 500-400 MG tablet Take 1 tablet by mouth daily.       . hydrochlorothiazide (MICROZIDE) 12.5 MG capsule Take 12.5 mg by mouth daily.      Marland Kitchen HYDROcodone-acetaminophen (NORCO/VICODIN) 5-325 MG per tablet Take 0.5-1 tablets by mouth every 6 (six) hours as needed for moderate pain.      . nitroGLYCERIN (NITROSTAT) 0.4 MG SL tablet Place 0.4 mg under the tongue every 5 (five) minutes as needed. For chest pain May take up to 3 doses, if pain persists call 911.      . pioglitazone (ACTOS) 30 MG tablet Take 30 mg by mouth daily.      . pravastatin (PRAVACHOL) 20 MG tablet Take 20 mg by mouth daily.      . ramipril (ALTACE) 5 MG capsule TAKE 1 CAPSULE BY MOUTH EVERY DAY  30 capsule  5  . Rivaroxaban (XARELTO) 15 MG TABS tablet Take 15 mg by mouth 2 (two) times daily  with a meal.      . traZODone (DESYREL) 150 MG tablet Take 150 mg by mouth at bedtime.       . triamcinolone cream (KENALOG) 0.1 % Apply 1 application topically daily.      Marland Kitchen VITAMIN E PO Take 1 tablet by mouth daily.        No current facility-administered medications for this visit.    Allergies  Allergen Reactions  . Amoxicillin-Pot Clavulanate Diarrhea  . Metformin And Related Diarrhea    Family History  Problem Relation Age of Onset  . Hypertension Mother   . Stroke Mother   . Diabetes Father   . Diabetes Sister   . Diabetes Brother   . Colon cancer Neg Hx     History   Social History  . Marital Status: Single    Spouse Name: N/A    Number of Children: N/A  . Years of Education: N/A   Occupational History  . Waitress     Social History Main Topics  . Smoking status: Former Research scientist (life sciences)  . Smokeless tobacco: Never Used  . Alcohol Use: No  . Drug Use: No  . Sexual Activity: No   Other Topics Concern  . 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   Independent for all ADLs, lives with a boyfriend.       Constitutional:  Denies fever, malaise, fatigue, headache or abrupt weight changes.  Skin: Pt reports ulcer of right lower extremity. Denies redness, rashes, lesions.   No other specific complaints in a complete review of systems (except as listed in HPI above).  Objective:   Physical Exam   BP 118/58  Pulse 71  Temp(Src) 98.2 F (36.8 C) (Oral)  Wt 155 lb (70.308 kg)  SpO2 98% Wt Readings from Last 3 Encounters:  01/08/14 155 lb (70.308 kg)  01/06/14 156 lb 8 oz (70.988 kg)  01/05/14 158 lb (71.668 kg)    General: Appears their stated age in NAD. Skin: Wound appears to be healing. No redness or warmth noted. No pus able to be expressed from the wound. Small amount of serous drainage noted on the gauze. Non tender with palpation.    BMET    Component Value Date/Time   NA 139 01/05/2014 0644   K 4.1 01/05/2014 0644   CL 103 01/05/2014 0644   CO2 26 01/05/2014 0644   GLUCOSE 139* 01/05/2014 0644   BUN 30* 01/05/2014 0644   CREATININE 0.81 01/05/2014 0644   CALCIUM 8.7 01/05/2014 0644   GFRNONAA 65* 01/05/2014 0644   GFRAA 75* 01/05/2014 0644    Lipid Panel     Component Value Date/Time   CHOL 123 07/22/2013 1402   TRIG 90.0 07/22/2013 1402   HDL 44.00 07/22/2013 1402   CHOLHDL 3 07/22/2013 1402   VLDL 18.0 07/22/2013 1402   LDLCALC 61 07/22/2013 1402    CBC    Component Value Date/Time   WBC 10.5 01/06/2014 1343   RBC 3.40* 01/06/2014 1343   HGB 9.6* 01/06/2014 1343   HCT 29.9* 01/06/2014 1343   PLT 258.0 01/06/2014 1343   MCV 88.0 01/06/2014 1343   MCH 27.7 01/05/2014 0644   MCHC 32.0 01/06/2014 1343   RDW 16.3* 01/06/2014 1343   LYMPHSABS 1.6 01/06/2014 1343   MONOABS 0.8 01/06/2014 1343   EOSABS 0.1 01/06/2014 1343   BASOSABS 0.1 01/06/2014 1343    Hgb A1C Lab Results  Component Value Date  HGBA1C 6.6* 11/18/2013        Assessment & Plan:   Venous ulcer of RLE:  Advised her to continue to change dressing daily, neosporin and guaze Elevate leg to help reduce swelling Watch for  fever, increased redness, increased pain  RTC as needed or if symptoms persist or worsen.

## 2014-01-08 NOTE — Telephone Encounter (Signed)
Noted. Thanks. Very anxious patient at baseline. Currently under treatment for RLE symptomatic provoked distal DVT with xarelto 15mg  bid dosing, we are doing slow taper off estrogen, have been treating small venous ulcers of R leg.

## 2014-01-08 NOTE — Progress Notes (Signed)
Pre visit review using our clinic review tool, if applicable. No additional management support is needed unless otherwise documented below in the visit note. 

## 2014-01-08 NOTE — Telephone Encounter (Signed)
Patient Information:  Caller Name: Shaun  Phone: [phone]  Patient: [name], [name]  Gender: Female  DOB: 14-May-1927  Age: 80 Years  PCP: [facility])  Office Follow Up:  Does the office need to follow up with this patient?: No  Instructions For The Office: N/A   Symptoms  Reason For Call & Symptoms: Pt is calling back about her leg ulcer being more red and painful. See notes from office visit in [place] on 01/06/14. Pt has a leg ulcer that MD applied triple abx ung and wrapped. Pt was to return if it worsened.  Reviewed Health History In EMR: Yes  Reviewed Medications In EMR: Yes  Reviewed Allergies In EMR: Yes  Reviewed Surgeries / Procedures: Yes  Date of Onset of Symptoms: 01/08/2014  Guideline(s) Used:  Skin Lesion - Moles or Growths  Disposition Per Guideline:   See Today in Office/Dr. G is fully booked today. RN offered another Provider but pt very anxious "states give me something tomorrow I will only see Dr. Darnell Level.  Reason For Disposition Reached:   Looks infected (e.g., spreading redness, pus, red streak)  Advice Given:  N/A  Patient Will Follow Care Advice:  YES  Appointment Scheduled:  01/09/2014 11:15:00 Appointment Scheduled Provider:  Ria Bush [facility])

## 2014-01-08 NOTE — Telephone Encounter (Signed)
Spoke with patient and advised her she should be seen sooner if her leg was painful and turning red. She said there also appeared to be some some pus at wound site. I convinced her to come in today. Appt changed.

## 2014-01-08 NOTE — Patient Instructions (Signed)
Stasis Ulcer Stasis ulcers occur in the legs when the circulation is damaged. An ulcer may look like a small hole in the skin.  CAUSES Stasis ulcers occur because your veins do not work properly. Veins have valves that help the blood return to the heart. If these valves do not work right, blood flows backwards and backs up into the veins near the skin. This condition causes the veins to become larger because of increased pressure and may lead to a stasis ulcer. SYMPTOMS   Shallow (superficial) sore on the leg.  Clear drainage or weeping from the sore.  Leg pain or a feeling of heaviness. This may be worse at the end of the day.  Leg swelling.  Skin color changes. DIAGNOSIS  Your caregiver will make a diagnosis by examining your leg. Your caregiver may order tests such as an ultrasound or other studies to evaluate the blood flow of the leg. HOME CARE INSTRUCTIONS   Do not stand or sit in one position for long periods of time. Do not sit with your legs crossed. Rest with your legs raised during the day. If possible, it is best if you can elevate your legs above your heart for 30 minutes, 3 to 4 times a day.  Wear elastic stockings or support hose. Do not wear other tight encircling garments around legs, pelvis, or waist. This causes increased pressure in your veins. If your caregiver has applied compressive medicated wraps, use them as instructed.  Walk as much as possible to increase blood flow. If you are taking long rides in a car or plane, take a break to walk around every 2 hours. If not already on aspirin, take a baby aspirin before long trips unless you have medical reasons that prohibit this.  Raise the foot of your bed at night with 2-inch blocks if approved by your caregiver. This may not be desirable if you have heart failure or breathing problems.  If you get a cut in the skin over the vein and the vein bleeds, lie down with your leg raised and gently clean the area with a clean  cloth. Apply pressure on the cut until the bleeding stops. Then place a dressing on the cut. See your caregiver if it continues to bleed or needs stitches. Also, see your caregiver if you develop an infection.Signs of an infection include a fever, redness, increased pain, and drainage of pus.  If your caregiver has given you a follow-up appointment, it is very important to keep that appointment. Not keeping the appointment could result in a chronic or permanent injury, pain, and disability. If there is any problem keeping the appointment, call your caregiver for assistance. SEEK IMMEDIATE MEDICAL CARE IF:  The ulcer area starts to break down.  You have pain, redness, tenderness, pus, or hard swelling in your leg over a vein or near the ulcer.  Your leg pain is uncomfortable.  You develop an unexplained fever.  You develop chest pain or shortness of breath. Document Released: 12/14/2000 Document Revised: 06/13/2011 Document Reviewed: 07/11/2010 [facility] Patient Information 2015 [place], [place]. This information is not intended to replace advice given to you by your health care provider. Make sure you discuss any questions you have with your health care provider.

## 2014-01-09 ENCOUNTER — Ambulatory Visit: Payer: Self-pay | Admitting: Family Medicine

## 2014-01-13 ENCOUNTER — Ambulatory Visit (INDEPENDENT_AMBULATORY_CARE_PROVIDER_SITE_OTHER): Payer: Medicare Other | Admitting: Family Medicine

## 2014-01-13 ENCOUNTER — Encounter: Payer: Self-pay | Admitting: Family Medicine

## 2014-01-13 VITALS — BP 100/60 | HR 80 | Temp 97.6°F | Wt 156.5 lb

## 2014-01-13 DIAGNOSIS — I87311 Chronic venous hypertension (idiopathic) with ulcer of right lower extremity: Secondary | ICD-10-CM | POA: Diagnosis not present

## 2014-01-13 DIAGNOSIS — L97919 Non-pressure chronic ulcer of unspecified part of right lower leg with unspecified severity: Principal | ICD-10-CM

## 2014-01-13 DIAGNOSIS — G8929 Other chronic pain: Secondary | ICD-10-CM | POA: Diagnosis not present

## 2014-01-13 DIAGNOSIS — M545 Low back pain, unspecified: Secondary | ICD-10-CM

## 2014-01-13 DIAGNOSIS — S51811A Laceration without foreign body of right forearm, initial encounter: Secondary | ICD-10-CM

## 2014-01-13 DIAGNOSIS — I10 Essential (primary) hypertension: Secondary | ICD-10-CM

## 2014-01-13 DIAGNOSIS — S51819A Laceration without foreign body of unspecified forearm, initial encounter: Secondary | ICD-10-CM | POA: Insufficient documentation

## 2014-01-13 DIAGNOSIS — I2510 Atherosclerotic heart disease of native coronary artery without angina pectoris: Secondary | ICD-10-CM

## 2014-01-13 DIAGNOSIS — I83019 Varicose veins of right lower extremity with ulcer of unspecified site: Secondary | ICD-10-CM

## 2014-01-13 DIAGNOSIS — I824Z1 Acute embolism and thrombosis of unspecified deep veins of right distal lower extremity: Secondary | ICD-10-CM

## 2014-01-13 MED ORDER — HYDROCODONE-ACETAMINOPHEN 5-325 MG PO TABS: 0.5000 | ORAL_TABLET | Freq: Four times a day (QID) | ORAL | Status: DC | PRN

## 2014-01-13 MED ORDER — RIVAROXABAN 20 MG PO TABS: 20.0000 mg | ORAL_TABLET | Freq: Every day | ORAL | Status: DC

## 2014-01-13 MED ORDER — RAMIPRIL 5 MG PO CAPS
ORAL_CAPSULE | ORAL | Status: DC
Start: 2014-01-13 — End: 2014-01-13

## 2014-01-13 MED ORDER — RAMIPRIL 5 MG PO CAPS: ORAL_CAPSULE | ORAL | Status: DC

## 2014-01-13 NOTE — Progress Notes (Signed)
BP 100/60  Pulse 80  Temp(Src) 97.6 F (36.4 C) (Oral)  Wt 156 lb 8 oz (70.988 kg)   CC: recheck venous ulcer with varicose vein  Subjective:    [name], female    DOB: 31-Jan-1928, 80 y.o.   MRN: [id number]  HPI: [name] is a 80 y.o. female presenting on 01/13/2014 for Varicose Veins and Leg Swelling   See prior note for details.  Persistent R medial venous wounds continue to slowly heal.  Injured R forearm with door, oozing tear present.  Relevant past medical, surgical, family and social history reviewed and updated as indicated.  Allergies and medications reviewed and updated. Current Outpatient Prescriptions on File Prior to Visit  Medication Sig  . ALPRAZolam (XANAX) 1 MG tablet Take 0.5 mg by mouth 4 (four) times daily.  . Ascorbic Acid (VITAMIN C PO) Take 1,000 mg by mouth daily.   Marland Kitchen aspirin EC 81 MG tablet Take 81 mg by mouth daily.  Marland Kitchen CALCIUM PO Take 600 mg by mouth daily.   . Cyanocobalamin (VITAMIN B 12 PO) Take 1 tablet by mouth daily.  Marland Kitchen DEXILANT 30 MG capsule TAKE 1 CAPSULE BY MOUTH DAILY  . estradiol (ESTRACE) 0.5 MG tablet Take 0.5 mg by mouth as directed. 1 pill every other day; 0.5 pill every other day  . gabapentin (NEURONTIN) 100 MG capsule Take 100 mg by mouth at bedtime.  Marland Kitchen glucosamine-chondroitin 500-400 MG tablet Take 1 tablet by mouth daily.   . hydrochlorothiazide (MICROZIDE) 12.5 MG capsule Take 12.5 mg by mouth daily.  . nitroGLYCERIN (NITROSTAT) 0.4 MG SL tablet Place 0.4 mg under the tongue every 5 (five) minutes as needed. For chest pain May take up to 3 doses, if pain persists call 911.  . pioglitazone (ACTOS) 30 MG tablet Take 30 mg by mouth daily.  . pravastatin (PRAVACHOL) 20 MG tablet Take 20 mg by mouth daily.  . traZODone (DESYREL) 150 MG tablet Take 150 mg by mouth at bedtime.   . triamcinolone cream (KENALOG) 0.1 % Apply 1 application topically daily.  Marland Kitchen VITAMIN E PO Take 1 tablet by mouth daily.    No  current facility-administered medications on file prior to visit.    Review of Systems Per HPI unless specifically indicated above    Objective:    BP 100/60  Pulse 80  Temp(Src) 97.6 F (36.4 C) (Oral)  Wt 156 lb 8 oz (70.988 kg)  Physical Exam  Nursing note and vitals reviewed. Constitutional: She appears well-developed and well-nourished. No distress.  Musculoskeletal: She exhibits edema (1+).  Some skin breakdown medial right lower leg with healing small ulcers, no surrounding erythema or induration  Skin: Skin is warm and dry. No rash noted.  Psychiatric: Her mood appears anxious.   Results for orders placed in visit on 01/06/14  CBC WITH DIFFERENTIAL      Result Value Ref Range   WBC 10.5  4.0 - 10.5 K/uL   RBC 3.40 (*) 3.87 - 5.11 Mil/uL   Hemoglobin 9.6 (*) 12.0 - 15.0 g/dL   HCT 29.9 (*) 36.0 - 46.0 %   MCV 88.0  78.0 - 100.0 fl   MCHC 32.0  30.0 - 36.0 g/dL   RDW 16.3 (*) 11.5 - 15.5 %   Platelets 258.0  150.0 - 400.0 K/uL   Neutrophils Relative % 76.1  43.0 - 77.0 %   Lymphocytes Relative 14.7  12.0 - 46.0 %   Monocytes Relative 7.5  3.0 - 12.0 %   Eosinophils Relative 1.0  0.0 - 5.0 %   Basophils Relative 0.7  0.0 - 3.0 %   Neutro Abs 8.0 (*) 1.4 - 7.7 K/uL   Lymphs Abs 1.6  0.7 - 4.0 K/uL   Monocytes Absolute 0.8  0.1 - 1.0 K/uL   Eosinophils Absolute 0.1  0.0 - 0.7 K/uL   Basophils Absolute 0.1  0.0 - 0.1 K/uL      Assessment & Plan:   Problem List Items Addressed This Visit   Venous ulcer of right leg - Primary     Continue daily dressing changes. Recommended start glucerna 1 can daily for extra protein in diet.    Skin tear of forearm without complication     Dress with gauze change daily. No evidence of infection today.    Hypertension     Some low blood pressures recently - decrease ramipril to 5mg  daily - pharmacy never received our e script last week. I have re sent today, and printed hard copy for pt to have if needed.    Relevant  Medications      ramipril (ALTACE) capsule      rivaroxaban (XARELTO) tablet   Chronic lower back pain     Hydrocodone refilled today, fill on 01/22/2014.    Relevant Medications      HYDROcodone-acetaminophen (NORCO/VICODIN) 5-325 MG per tablet   Acute deep vein thrombosis (DVT) of distal vein of right lower extremity     Continue xarelto 15mg  bid x1 more week then fill 20mg  once daily - script printed for patient.    Relevant Medications      ramipril (ALTACE) capsule      rivaroxaban (XARELTO) tablet       Follow up plan: Return in about 1 week (around 01/20/2014), or if symptoms worsen or fail to improve, for follow up visit.

## 2014-01-13 NOTE — Assessment & Plan Note (Signed)
Dress with gauze change daily. No evidence of infection today.

## 2014-01-13 NOTE — Assessment & Plan Note (Signed)
Hydrocodone refilled today, fill on 01/22/2014.

## 2014-01-13 NOTE — Assessment & Plan Note (Signed)
Continue daily dressing changes. Recommended start glucerna 1 can daily for extra protein in diet.

## 2014-01-13 NOTE — Progress Notes (Signed)
Pre visit review using our clinic review tool, if applicable. No additional management support is needed unless otherwise documented below in the visit note. 

## 2014-01-13 NOTE — Assessment & Plan Note (Signed)
Some low blood pressures recently - decrease ramipril to 5mg  daily - pharmacy never received our e script last week. I have re sent today, and printed hard copy for pt to have if needed.

## 2014-01-13 NOTE — Patient Instructions (Signed)
Start lower ramipril dose 5mg  daily. Continue daily dressing changes daily. Start glucerna 1/2-1 can every day. Next week start xarelto 20mg  once daily (prescription provided)

## 2014-01-13 NOTE — Assessment & Plan Note (Signed)
Continue xarelto 15mg  bid x1 more week then fill 20mg  once daily - script printed for patient.

## 2014-01-15 ENCOUNTER — Telehealth: Payer: Self-pay

## 2014-01-15 NOTE — Telephone Encounter (Signed)
[name] with [facility] said pt has silver dollar sized purple area on lower lt leg that is painful. No CP or SOB. Pt is already taking Xarelto.No available appts today.Dr Darnell Level said he will see pt on 01/16/14 at 10:15 AM. Pt will call office sooner if condition worsens prior to appt.

## 2014-01-15 NOTE — Telephone Encounter (Signed)
Patient Information:  Caller Name: Luda  Phone: [phone]  Patient: [name]  Gender: Female  DOB: [birth year]  Age: 80 Years  PCP: R[facility] Bush [facility])  Office Follow Up:  Does the office need to follow up with this patient?: No  Instructions For The Office: N/A  RN Note:  Discussed with office LPN who spoke with Dr. Landry Dyke pt to continue her meds as prescribed (taking Xarelto) and booked in first available appt (tomorrow at 10:15am), per MD. Pt instructed to call office immediately for any worsening sxs prior to appt, especially any SOB or chest pain. Pt agreed to plan.  Symptoms  Reason For Call & Symptoms: Swollen blue/purple spot on LLE above ankle that is very painful. Spot is a [place] larger than a silver dollar. Pt states area felt tender for several days and then was able to see the spot yesterday. States she was just seen in office 2 days ago--Mon 10/12 for DVT/venous ulcer in other leg, RLE.  Reviewed Health History In EMR: Yes  Reviewed Medications In EMR: Yes  Reviewed Allergies In EMR: Yes  Reviewed Surgeries / Procedures: Yes  Date of Onset of Symptoms: 01/13/2014  Guideline(s) Used:  Leg Pain  Disposition Per Guideline:   Go to ED Now (or to Office with PCP Approval)  Reason For Disposition Reached:   Thigh, calf, or ankle swelling in only one leg  Advice Given:  Call Back If:  You become worse.  Patient Will Follow Care Advice:  YES  Appointment Scheduled:  01/16/2014 10:15:00 Appointment Scheduled Provider:  [facility])

## 2014-01-15 NOTE — Telephone Encounter (Signed)
Noted. Agree.

## 2014-01-16 ENCOUNTER — Encounter: Payer: Self-pay | Admitting: Family Medicine

## 2014-01-16 ENCOUNTER — Ambulatory Visit (INDEPENDENT_AMBULATORY_CARE_PROVIDER_SITE_OTHER): Payer: Medicare Other | Admitting: Family Medicine

## 2014-01-16 VITALS — BP 130/74 | HR 84 | Temp 98.1°F | Wt 157.5 lb

## 2014-01-16 DIAGNOSIS — I83019 Varicose veins of right lower extremity with ulcer of unspecified site: Secondary | ICD-10-CM

## 2014-01-16 DIAGNOSIS — L03116 Cellulitis of left lower limb: Secondary | ICD-10-CM

## 2014-01-16 DIAGNOSIS — S51811A Laceration without foreign body of right forearm, initial encounter: Secondary | ICD-10-CM

## 2014-01-16 DIAGNOSIS — R600 Localized edema: Secondary | ICD-10-CM

## 2014-01-16 DIAGNOSIS — I824Z1 Acute embolism and thrombosis of unspecified deep veins of right distal lower extremity: Secondary | ICD-10-CM | POA: Diagnosis not present

## 2014-01-16 DIAGNOSIS — I87311 Chronic venous hypertension (idiopathic) with ulcer of right lower extremity: Secondary | ICD-10-CM

## 2014-01-16 DIAGNOSIS — I2510 Atherosclerotic heart disease of native coronary artery without angina pectoris: Secondary | ICD-10-CM | POA: Diagnosis not present

## 2014-01-16 DIAGNOSIS — L97919 Non-pressure chronic ulcer of unspecified part of right lower leg with unspecified severity: Secondary | ICD-10-CM

## 2014-01-16 HISTORY — DX: Cellulitis of left lower limb: L03.116

## 2014-01-16 MED ORDER — RIVAROXABAN 20 MG PO TABS: 20.0000 mg | ORAL_TABLET | Freq: Every day | ORAL | Status: DC

## 2014-01-16 MED ORDER — CEFTRIAXONE SODIUM 1 G IJ SOLR
1.0000 g | Freq: Once | INTRAMUSCULAR | Status: AC
  Administered 2014-01-16: 1 g via INTRAMUSCULAR

## 2014-01-16 MED ORDER — SULFAMETHOXAZOLE-TMP DS 800-160 MG PO TABS: 1.0000 | ORAL_TABLET | Freq: Two times a day (BID) | ORAL | Status: DC

## 2014-01-16 MED ORDER — CEPHALEXIN 500 MG PO CAPS: 500.0000 mg | ORAL_CAPSULE | Freq: Three times a day (TID) | ORAL | Status: DC

## 2014-01-16 NOTE — Assessment & Plan Note (Signed)
Continue daily dressing changes. Continue glucerna.

## 2014-01-16 NOTE — Patient Instructions (Addendum)
Change xarelto from 15mg  twice daily to 15mg  once daily - may use medicine you have at home until you run out then fill 20mg  once daily dose. I'm worried about left leg skin infection. Shot of antibiotic today. Start bactrim and keflex daily as antibiotics for left leg. Keep legs elevated as much as able, rest at home as much as able. We have rewrapped R forearm and R leg wounds. Return tomorrow afternoon for a recheck of leg.

## 2014-01-16 NOTE — Assessment & Plan Note (Signed)
Now with concern for developing cellulitis of left leg 2/2 erythema, edema and pain Anticipate mode of entry was tinea pedis or great toe wound. Treat with CTX 1gm IM today, then start bactrim/keflex course. Recheck tomorrow afternoon. Consider [place] to r/o DVT - although already on appropriate therapy of xarelto. Consider imaging to eval deeper skin infection if not responding to abx.

## 2014-01-16 NOTE — Assessment & Plan Note (Signed)
Continue dressing changes daily.

## 2014-01-16 NOTE — Assessment & Plan Note (Signed)
Continue lasix 10mg  daily. Continue to encourage leg elevation and staying out of work. Continue to encourage glucerna use.

## 2014-01-16 NOTE — Progress Notes (Signed)
Pre visit review using our clinic review tool, if applicable. No additional management support is needed unless otherwise documented below in the visit note. 

## 2014-01-16 NOTE — Progress Notes (Signed)
BP 130/74  Pulse 84  Temp(Src) 98.1 F (36.7 C) (Oral)  Wt 157 lb 8 oz (71.442 kg)   CC: check leg wound  Subjective:    [name], female    DOB: [birth year], 80 y.o.   MRN: [id number]  HPI: [name] is a 80 y.o. female presenting on 01/16/2014 for Leg Swelling   Called call a nurse yesterday with concern for new lesion L lower leg. Progressive swelling/pain and erythema to left leg over last few days.   Has noticed increased weeping from R medial leg ulcers, saturating bandage. Continues daily dressing change to R forearm wound. Denies fevers/chills.  Already on xarelto 15mg  bid for provoked R distal DVT. Forgot this morning's dose.  Past Medical History  Diagnosis Date  . Hypertension   . Coronary artery disease     CABG 1998  . Anxiety   . Diastolic dysfunction   . Hyperlipidemia   . PUD (peptic ulcer disease)   . Microcytic anemia   . Depression   . Diabetes mellitus     type 2, followed by endo.  . Diverticulosis   . Hemorrhoids   . History of heart attack   . Irritable bowel syndrome   . Chronic back pain 03/2012    lumbar DDD with herniation - s/p L S1 transforaminal ESI (10/2012 Dr. Sharlet Salina)  . Varicose veins   . Cystocele   . Hx of echocardiogram     a. Echo 10/13: mild LVH, EF 55-60%, Gr 1 diast dysfn, PASP 32  . Acute deep vein thrombosis (DVT) of distal vein of right lower extremity 12/2013    Acute thrombus of R proximal gastrocnemius vein. Symptomatic provoked distal DVT     Relevant past medical, surgical, family and social history reviewed and updated as indicated.  Allergies and medications reviewed and updated. Current Outpatient Prescriptions on File Prior to Visit  Medication Sig  . ALPRAZolam (XANAX) 1 MG tablet Take 0.5 mg by mouth 4 (four) times daily.  . Ascorbic Acid (VITAMIN C PO) Take 1,000 mg by mouth daily.   Marland Kitchen aspirin EC 81 MG tablet Take 81 mg by mouth daily.  Marland Kitchen CALCIUM PO Take 600 mg by mouth daily.     . Cyanocobalamin (VITAMIN B 12 PO) Take 1 tablet by mouth daily.  Marland Kitchen DEXILANT 30 MG capsule TAKE 1 CAPSULE BY MOUTH DAILY  . estradiol (ESTRACE) 0.5 MG tablet Take 0.5 mg by mouth as directed. 1 pill every other day; 0.5 pill every other day  . gabapentin (NEURONTIN) 100 MG capsule Take 100 mg by mouth at bedtime.  Marland Kitchen glucosamine-chondroitin 500-400 MG tablet Take 1 tablet by mouth daily.   . hydrochlorothiazide (MICROZIDE) 12.5 MG capsule Take 12.5 mg by mouth daily.  Marland Kitchen HYDROcodone-acetaminophen (NORCO/VICODIN) 5-325 MG per tablet Take 0.5-1 tablets by mouth every 6 (six) hours as needed for moderate pain.  . nitroGLYCERIN (NITROSTAT) 0.4 MG SL tablet Place 0.4 mg under the tongue every 5 (five) minutes as needed. For chest pain May take up to 3 doses, if pain persists call 911.  . pioglitazone (ACTOS) 30 MG tablet Take 30 mg by mouth daily.  . pravastatin (PRAVACHOL) 20 MG tablet Take 20 mg by mouth daily.  . ramipril (ALTACE) 5 MG capsule TAKE 1 CAPSULE BY MOUTH EVERY DAY  . traZODone (DESYREL) 150 MG tablet Take 150 mg by mouth at bedtime.   . triamcinolone cream (KENALOG) 0.1 % Apply 1 application topically daily.  Marland Kitchen  VITAMIN E PO Take 1 tablet by mouth daily.    No current facility-administered medications on file prior to visit.    Review of Systems Per HPI unless specifically indicated above    Objective:    BP 130/74  Pulse 84  Temp(Src) 98.1 F (36.7 C) (Oral)  Wt 157 lb 8 oz (71.442 kg)  Physical Exam  Nursing note and vitals reviewed. Constitutional: She appears well-developed and well-nourished. No distress.  Chronically ill, elderly walks with walker  Musculoskeletal: She exhibits edema (2+).  Some skin breakdown medial right lower leg with healing small ulcers, no surrounding erythema or induration L leg with erythema spreading past mid calf, delineated. Dark discoloration medial left lower leg, very tender to palpation R forearm wound without erythema, continues  oozing blood  Neurological: She displays normal reflexes.  Skin: Skin is warm and dry. There is erythema (LLE, erythema delineated).  Psychiatric: Her mood appears anxious.   R forearm/leg wounds redressed today     Assessment & Plan:   Problem List Items Addressed This Visit   Venous ulcer of right leg     Continue daily dressing changes. Continue glucerna.    Skin tear of forearm without complication     Continue dressing changes daily.    Pedal edema     Continue lasix 10mg  daily. Continue to encourage leg elevation and staying out of work. Continue to encourage glucerna use.    Cellulitis of leg, left - Primary     Now with concern for developing cellulitis of left leg 2/2 erythema, edema and pain Anticipate mode of entry was tinea pedis or great toe wound. Treat with CTX 1gm IM today, then start bactrim/keflex course. Recheck tomorrow afternoon. Consider [place] to r/o DVT - although already on appropriate therapy of xarelto. Consider imaging to eval deeper skin infection if not responding to abx.    Relevant Medications      cefTRIAXone (ROCEPHIN) injection 1 g (Completed)   Acute deep vein thrombosis (DVT) of distal vein of right lower extremity     Continue xarelto, decrease to 15mg  daily until runs out of course. Pt worried about affording 20mg  dose currently.    Relevant Medications      rivaroxaban (XARELTO) tablet       Follow up plan: Return if symptoms worsen or fail to improve.

## 2014-01-16 NOTE — Assessment & Plan Note (Signed)
Continue xarelto, decrease to 15mg  daily until runs out of course. Pt worried about affording 20mg  dose currently.

## 2014-01-17 ENCOUNTER — Ambulatory Visit (INDEPENDENT_AMBULATORY_CARE_PROVIDER_SITE_OTHER): Payer: Medicare Other | Admitting: Family Medicine

## 2014-01-17 ENCOUNTER — Encounter: Payer: Self-pay | Admitting: Family Medicine

## 2014-01-17 VITALS — BP 136/64 | HR 80 | Temp 98.1°F | Wt 157.0 lb

## 2014-01-17 DIAGNOSIS — I2510 Atherosclerotic heart disease of native coronary artery without angina pectoris: Secondary | ICD-10-CM | POA: Diagnosis not present

## 2014-01-17 DIAGNOSIS — S51811D Laceration without foreign body of right forearm, subsequent encounter: Secondary | ICD-10-CM

## 2014-01-17 DIAGNOSIS — L03116 Cellulitis of left lower limb: Secondary | ICD-10-CM

## 2014-01-17 DIAGNOSIS — N951 Menopausal and female climacteric states: Secondary | ICD-10-CM | POA: Diagnosis not present

## 2014-01-17 DIAGNOSIS — R232 Flushing: Secondary | ICD-10-CM

## 2014-01-17 NOTE — Assessment & Plan Note (Signed)
Continue daily dressing changes

## 2014-01-17 NOTE — Patient Instructions (Addendum)
Stop bactrim. Continue keflex capsule 500mg  three times a day with meals. Ok to take zantac if needed. Left leg is looking better! Keep appointment next week.

## 2014-01-17 NOTE — Progress Notes (Signed)
BP 136/64  Pulse 80  Temp(Src) 98.1 F (36.7 C) (Oral)  Wt 157 lb (71.215 kg)   CC: f/u cellulitis  Subjective:    [name], female    DOB: September 23, 1927, 80 y.o.   MRN: [id number]  HPI: [name] is a 80 y.o. female presenting on 01/17/2014 for Wound Check   See yesterday's note for details. Briefly seen here with concern for LLE cellulitis, treated with CTX IM and bactrim/keflex started yesterday. Area delineated.  She has tolerated slow taper off estrogen well - down to 1/2 tablet daily.  Relevant past medical, surgical, family and social history reviewed and updated as indicated.  Allergies and medications reviewed and updated. Current Outpatient Prescriptions on File Prior to Visit  Medication Sig  . ALPRAZolam (XANAX) 1 MG tablet Take 0.5 mg by mouth 4 (four) times daily.  . Ascorbic Acid (VITAMIN C PO) Take 1,000 mg by mouth daily.   Marland Kitchen aspirin EC 81 MG tablet Take 81 mg by mouth daily.  Marland Kitchen CALCIUM PO Take 600 mg by mouth daily.   . Cyanocobalamin (VITAMIN B 12 PO) Take 1 tablet by mouth daily.  Marland Kitchen DEXILANT 30 MG capsule TAKE 1 CAPSULE BY MOUTH DAILY  . gabapentin (NEURONTIN) 100 MG capsule Take 100 mg by mouth at bedtime.  Marland Kitchen glucosamine-chondroitin 500-400 MG tablet Take 1 tablet by mouth daily.   . hydrochlorothiazide (MICROZIDE) 12.5 MG capsule Take 12.5 mg by mouth daily.  Marland Kitchen HYDROcodone-acetaminophen (NORCO/VICODIN) 5-325 MG per tablet Take 0.5-1 tablets by mouth every 6 (six) hours as needed for moderate pain.  . nitroGLYCERIN (NITROSTAT) 0.4 MG SL tablet Place 0.4 mg under the tongue every 5 (five) minutes as needed. For chest pain May take up to 3 doses, if pain persists call 911.  . pioglitazone (ACTOS) 30 MG tablet Take 30 mg by mouth daily.  . pravastatin (PRAVACHOL) 20 MG tablet Take 20 mg by mouth daily.  . ramipril (ALTACE) 5 MG capsule TAKE 1 CAPSULE BY MOUTH EVERY DAY  . rivaroxaban (XARELTO) 20 MG TABS tablet Take 1 tablet (20 mg  total) by mouth daily with supper.  . traZODone (DESYREL) 150 MG tablet Take 150 mg by mouth at bedtime.   . triamcinolone cream (KENALOG) 0.1 % Apply 1 application topically daily.  Marland Kitchen VITAMIN E PO Take 1 tablet by mouth daily.   . cephALEXin (KEFLEX) 500 MG capsule Take 1 capsule (500 mg total) by mouth 3 (three) times daily.   No current facility-administered medications on file prior to visit.    Review of Systems Per HPI unless specifically indicated above    Objective:    BP 136/64  Pulse 80  Temp(Src) 98.1 F (36.7 C) (Oral)  Wt 157 lb (71.215 kg)  Physical Exam  Nursing note and vitals reviewed. Constitutional: She appears well-developed and well-nourished. No distress.  Chronically ill, elderly walks with walker  Musculoskeletal: She exhibits edema (1+ R>L).  Some skin breakdown medial right lower leg with healing small ulcers, no surrounding erythema or induration L leg with improved erythema. Dark discoloration medial left lower leg, very tender to palpation R forearm wound without erythema, continues oozing blood  Neurological: She displays normal reflexes.  Skin: Skin is warm and dry.  Psychiatric: Her mood appears anxious.   Lab Results  Component Value Date   CREATININE 0.81 01/05/2014      Assessment & Plan:   Problem List Items Addressed This Visit   Skin tear of forearm  without complication     Continue daily dressing changes.    Hot flashes - Primary     Tolerating lower estradiol dose of 0.5mg  1/2 tablet daily.    Cellulitis of leg, left     Much improved in 24 hours after IM CTX, keflex and bactrim however nauseated with one of these meds.  Recommend stop bactrim and continue keflex tid daily. Recheck on Monday.        Follow up plan: Return if symptoms worsen or fail to improve, for keep appointment monday.

## 2014-01-17 NOTE — Progress Notes (Signed)
Pre visit review using our clinic review tool, if applicable. No additional management support is needed unless otherwise documented below in the visit note. 

## 2014-01-17 NOTE — Assessment & Plan Note (Signed)
Tolerating lower estradiol dose of 0.5mg  1/2 tablet daily.

## 2014-01-17 NOTE — Assessment & Plan Note (Signed)
Much improved in 24 hours after IM CTX, keflex and bactrim however nauseated with one of these meds.  Recommend stop bactrim and continue keflex tid daily. Recheck on Monday.

## 2014-01-20 ENCOUNTER — Encounter: Payer: Self-pay | Admitting: Family Medicine

## 2014-01-20 ENCOUNTER — Ambulatory Visit (INDEPENDENT_AMBULATORY_CARE_PROVIDER_SITE_OTHER): Payer: Medicare Other | Admitting: Family Medicine

## 2014-01-20 VITALS — BP 126/64 | HR 88 | Temp 98.1°F | Wt 156.8 lb

## 2014-01-20 DIAGNOSIS — I824Z1 Acute embolism and thrombosis of unspecified deep veins of right distal lower extremity: Secondary | ICD-10-CM

## 2014-01-20 DIAGNOSIS — L03116 Cellulitis of left lower limb: Secondary | ICD-10-CM | POA: Diagnosis not present

## 2014-01-20 DIAGNOSIS — I2510 Atherosclerotic heart disease of native coronary artery without angina pectoris: Secondary | ICD-10-CM

## 2014-01-20 DIAGNOSIS — S51811D Laceration without foreign body of right forearm, subsequent encounter: Secondary | ICD-10-CM

## 2014-01-20 MED ORDER — RIVAROXABAN 20 MG PO TABS: 20.0000 mg | ORAL_TABLET | Freq: Every day | ORAL | Status: DC

## 2014-01-20 NOTE — Assessment & Plan Note (Signed)
Emphasized importance of continued xarelto - start 20mg  daily.

## 2014-01-20 NOTE — Patient Instructions (Signed)
Start xarelto 20mg  one pill daily (rust colored pill). Finish full course of keflex. Keep legs elevated, stay home from work as much as able. Continue glucerna. Legs are slowly improving. Continue dressing changes as up to now. Return in 1 week for follow up.

## 2014-01-20 NOTE — Assessment & Plan Note (Signed)
Continues slowly improving. Finish keflex, keep legs up, avoid standing on her feet, rtc 1 wk for f/u

## 2014-01-20 NOTE — Addendum Note (Signed)
Addended by: Ria Bush on: 01/20/2014 12:08 PM   Modules accepted: Orders

## 2014-01-20 NOTE — Progress Notes (Signed)
BP 126/64  Pulse 88  Temp(Src) 98.1 F (36.7 C) (Oral)  Wt 156 lb 12 oz (71.101 kg)   CC: f/u wounds  Subjective:    [name], female    DOB: 1927-11-01, 80 y.o.   MRN: [id number]  HPI: [name] is a 80 y.o. female presenting on 01/20/2014 for Follow-up   See prior note for details.  Briefly seen here 01/16/2014 with concern for LLE cellulitis, treated with CTX IM and bactrim/keflex started. Combination of meds caused nausea so we stopped bactrim, continued keflex tid. Presents today for recheck. Swelling and erythema of L leg have improved but persistent marked pain of L leg.   Tolerating slow taper off estrogen down to 0.5mg  1/2 tab daily.  Relevant past medical, surgical, family and social history reviewed and updated as indicated.  Allergies and medications reviewed and updated. Current Outpatient Prescriptions on File Prior to Visit  Medication Sig  . ALPRAZolam (XANAX) 1 MG tablet Take 0.5 mg by mouth 4 (four) times daily.  . Ascorbic Acid (VITAMIN C PO) Take 1,000 mg by mouth daily.   Marland Kitchen aspirin EC 81 MG tablet Take 81 mg by mouth daily.  Marland Kitchen CALCIUM PO Take 600 mg by mouth daily.   . cephALEXin (KEFLEX) 500 MG capsule Take 1 capsule (500 mg total) by mouth 3 (three) times daily.  . Cyanocobalamin (VITAMIN B 12 PO) Take 1 tablet by mouth daily.  Marland Kitchen DEXILANT 30 MG capsule TAKE 1 CAPSULE BY MOUTH DAILY  . estradiol (ESTRACE) 0.5 MG tablet Take 1 tablet (0.5 mg total) by mouth daily.  Marland Kitchen gabapentin (NEURONTIN) 100 MG capsule Take 100 mg by mouth at bedtime.  Marland Kitchen glucosamine-chondroitin 500-400 MG tablet Take 1 tablet by mouth daily.   . hydrochlorothiazide (MICROZIDE) 12.5 MG capsule Take 12.5 mg by mouth daily.  Marland Kitchen HYDROcodone-acetaminophen (NORCO/VICODIN) 5-325 MG per tablet Take 0.5-1 tablets by mouth every 6 (six) hours as needed for moderate pain.  . nitroGLYCERIN (NITROSTAT) 0.4 MG SL tablet Place 0.4 mg under the tongue every 5 (five) minutes as  needed. For chest pain May take up to 3 doses, if pain persists call 911.  . pioglitazone (ACTOS) 30 MG tablet Take 30 mg by mouth daily.  . pravastatin (PRAVACHOL) 20 MG tablet Take 20 mg by mouth daily.  . ramipril (ALTACE) 5 MG capsule TAKE 1 CAPSULE BY MOUTH EVERY DAY  . rivaroxaban (XARELTO) 20 MG TABS tablet Take 1 tablet (20 mg total) by mouth daily with supper.  . traZODone (DESYREL) 150 MG tablet Take 150 mg by mouth at bedtime.   . triamcinolone cream (KENALOG) 0.1 % Apply 1 application topically daily.  Marland Kitchen VITAMIN E PO Take 1 tablet by mouth daily.    No current facility-administered medications on file prior to visit.    Review of Systems Per HPI unless specifically indicated above    Objective:    BP 126/64  Pulse 88  Temp(Src) 98.1 F (36.7 C) (Oral)  Wt 156 lb 12 oz (71.101 kg)  Physical Exam  Nursing note and vitals reviewed. Constitutional: She appears well-developed and well-nourished. No distress.  Chronically ill, elderly walks with walker  Musculoskeletal: She exhibits edema (1+ L>R).  Improving skin breakdown medial right lower leg with healing small ulcers, no surrounding erythema or induration L leg with improved erythema. Dark discoloration medial left lower leg, very tender to palpation. Edema present. R forearm wound slowly healing. Stopped bleeding.  Neurological: She displays normal reflexes.  Skin: Skin is warm and dry.  Psychiatric: Her mood appears anxious.       Assessment & Plan:   Problem List Items Addressed This Visit   Skin tear of forearm without complication     Slowly closing on its own.    Cellulitis of leg, left - Primary     Continues slowly improving. Finish keflex, keep legs up, avoid standing on her feet, rtc 1 wk for f/u    Acute deep vein thrombosis (DVT) of distal vein of right lower extremity     Emphasized importance of continued xarelto - start 20mg  daily.        Follow up plan: Return in about 1 week (around  01/27/2014), or as needed, for follow up visit.

## 2014-01-20 NOTE — Assessment & Plan Note (Signed)
Slowly closing on its own.

## 2014-01-20 NOTE — Progress Notes (Signed)
Pre visit review using our clinic review tool, if applicable. No additional management support is needed unless otherwise documented below in the visit note. 

## 2014-01-24 ENCOUNTER — Telehealth: Payer: Self-pay

## 2014-01-24 NOTE — Telephone Encounter (Signed)
Can you see about getting her on Dr. Sharmaine Base schedule? Thanks.

## 2014-01-24 NOTE — Telephone Encounter (Signed)
Per report, patient couldn't be contacted by phone about f/u here.  If you can get her and she needs to be seen, then add her on at 4:00.  Thanks.

## 2014-01-24 NOTE — Telephone Encounter (Signed)
Per Donna-Dr. B has no room on her schedule.

## 2014-01-24 NOTE — Telephone Encounter (Signed)
Patient Information: Caller Name: Triona Phone: [phone] Patient: [name], [name] Gender: Female DOB: 01/03/1928 Age: 80 Years PCP: Ria Bush [facility]) Office Follow Up: Does the office need to follow up with this patient?: Yes Instructions For The Office: Pt is requesting MD advise. Symptoms Reason For Call & Symptoms: Pt has an ulcer on her right lower leg that she has been on antibiiotic for (Keflex). Pt was advised by Dr Danise Mina to wear compression hose, stay off feet, but ok to worok. 01/23/14 right lower leg "vein busted" and EMS had to come out and wrap leg (pt is on Xarelto) Pt thinks it may have been from ulcer that was already there. Pt was dizzy at the time and SBP was 102 per EMS. 01/24/14 leg is still wrapped, no blood visable. Afebrile. Pt feel well, no further dizziness. She would like to make sure if there is anything else she should be doing other than the compression hose and staying off feet that she should be doing - requesting a message be sent to MD. Advised pt to apply pressure if any recurring bleeding and to callback. Reviewed Health History In EMR: Yes Reviewed Medications In EMR: Yes Reviewed Allergies In EMR: Yes Reviewed Surgeries / Procedures: Yes Date of Onset of Symptoms: 01/23/2014 Guideline(s) Used: Skin Injury Disposition Per Guideline: See Within 3 Days in Office Reason For Disposition Reached: After 14 days and wound isn't healed Advice Given: N/A Patient Refused Recommendation: Patient Refused Care Advice Pt requests for a message to be sent to MD (pt is aware that Danise Mina is out of the office and is ok with another MD's recommendation).

## 2014-01-24 NOTE — Telephone Encounter (Signed)
[name] spoke with pt and she did not want to come to office today and [name] scheduled appt with Dr Darnell Level 01/27/14.

## 2014-01-24 NOTE — Telephone Encounter (Signed)
Noted, thanks!

## 2014-01-25 ENCOUNTER — Other Ambulatory Visit: Payer: Self-pay | Admitting: Family Medicine

## 2014-01-27 ENCOUNTER — Encounter: Payer: Self-pay | Admitting: Family Medicine

## 2014-01-27 ENCOUNTER — Ambulatory Visit (INDEPENDENT_AMBULATORY_CARE_PROVIDER_SITE_OTHER): Payer: Medicare Other | Admitting: Family Medicine

## 2014-01-27 VITALS — BP 128/72 | HR 76 | Temp 97.7°F | Wt 158.2 lb

## 2014-01-27 DIAGNOSIS — I2510 Atherosclerotic heart disease of native coronary artery without angina pectoris: Secondary | ICD-10-CM | POA: Diagnosis not present

## 2014-01-27 DIAGNOSIS — R600 Localized edema: Secondary | ICD-10-CM | POA: Diagnosis not present

## 2014-01-27 DIAGNOSIS — N951 Menopausal and female climacteric states: Secondary | ICD-10-CM | POA: Diagnosis not present

## 2014-01-27 DIAGNOSIS — I83019 Varicose veins of right lower extremity with ulcer of unspecified site: Secondary | ICD-10-CM

## 2014-01-27 DIAGNOSIS — L97929 Non-pressure chronic ulcer of unspecified part of left lower leg with unspecified severity: Principal | ICD-10-CM

## 2014-01-27 DIAGNOSIS — I872 Venous insufficiency (chronic) (peripheral): Secondary | ICD-10-CM | POA: Diagnosis not present

## 2014-01-27 DIAGNOSIS — R232 Flushing: Secondary | ICD-10-CM

## 2014-01-27 DIAGNOSIS — L97919 Non-pressure chronic ulcer of unspecified part of right lower leg with unspecified severity: Principal | ICD-10-CM

## 2014-01-27 DIAGNOSIS — I83029 Varicose veins of left lower extremity with ulcer of unspecified site: Principal | ICD-10-CM

## 2014-01-27 MED ORDER — FUROSEMIDE 20 MG PO TABS: 20.0000 mg | ORAL_TABLET | Freq: Every day | ORAL | Status: DC

## 2014-01-27 NOTE — Patient Instructions (Addendum)
Stop hydrochlorothiazide capsule water pill. Start lasix 20mg  daily stronger water pill.  Finish keflex antibioitic. Continue xarelto 20mg  daily blood thinner. Decrease estrogen to 1/2 tablet every other day. Return in 1 week for recheck.

## 2014-01-27 NOTE — Assessment & Plan Note (Signed)
Stop hctz - start stronger lasix 20mg  daily.  Check Cr/K next week.

## 2014-01-27 NOTE — Assessment & Plan Note (Signed)
Continue glucerna, continue daily dressing changes. No active bleed today. Will work towards improving pedal edema with change from hctz to lasix.

## 2014-01-27 NOTE — Progress Notes (Addendum)
BP 128/72  Pulse 76  Temp(Src) 97.7 F (36.5 C) (Oral)  Wt 158 lb 4 oz (71.782 kg)   CC: f/u visit  Subjective:    [name], female    DOB: 18-Oct-1927, 80 y.o.   MRN: [id number]  HPI: [name] is a 80 y.o. female presenting on 01/27/2014 for Follow-up   See prior note for details. Briefly seen here 01/16/2014 with concern for LLE cellulitis, treated with CTX IM and bactrim/keflex started. Combination of meds caused nausea so we stopped bactrim, continued keflex tid.   Thursday or Friday vein in R lower leg started bleeding, called EMS who wrapped leg. Some hypotension at that time.  Pt on xarelto for recent distal LE DVT.  Continues on compression stocking.   Relevant past medical, surgical, family and social history reviewed and updated as indicated.  Allergies and medications reviewed and updated. Current Outpatient Prescriptions on File Prior to Visit  Medication Sig  . ALPRAZolam (XANAX) 1 MG tablet Take 0.5 mg by mouth 4 (four) times daily.  . Ascorbic Acid (VITAMIN C PO) Take 1,000 mg by mouth daily.   Marland Kitchen aspirin EC 81 MG tablet Take 81 mg by mouth daily.  Marland Kitchen CALCIUM PO Take 600 mg by mouth daily.   . cephALEXin (KEFLEX) 500 MG capsule Take 1 capsule (500 mg total) by mouth 3 (three) times daily.  . Cyanocobalamin (VITAMIN B 12 PO) Take 1 tablet by mouth daily.  Marland Kitchen DEXILANT 30 MG capsule TAKE 1 CAPSULE BY MOUTH DAILY  . gabapentin (NEURONTIN) 100 MG capsule Take 100 mg by mouth at bedtime.  Marland Kitchen glucosamine-chondroitin 500-400 MG tablet Take 1 tablet by mouth daily.   Marland Kitchen HYDROcodone-acetaminophen (NORCO/VICODIN) 5-325 MG per tablet Take 0.5-1 tablets by mouth every 6 (six) hours as needed for moderate pain.  . nitroGLYCERIN (NITROSTAT) 0.4 MG SL tablet Place 0.4 mg under the tongue every 5 (five) minutes as needed. For chest pain May take up to 3 doses, if pain persists call 911.  . pioglitazone (ACTOS) 30 MG tablet Take 30 mg by mouth daily.  .  pravastatin (PRAVACHOL) 20 MG tablet TAKE 1 TABLET BY MOUTH DAILY.  . ramipril (ALTACE) 5 MG capsule TAKE 1 CAPSULE BY MOUTH EVERY DAY  . rivaroxaban (XARELTO) 20 MG TABS tablet Take 1 tablet (20 mg total) by mouth daily with supper.  . traZODone (DESYREL) 150 MG tablet Take 150 mg by mouth at bedtime.   . triamcinolone cream (KENALOG) 0.1 % Apply 1 application topically daily.  Marland Kitchen VITAMIN E PO Take 1 tablet by mouth daily.    No current facility-administered medications on file prior to visit.    Review of Systems Per HPI unless specifically indicated above    Objective:    BP 128/72  Pulse 76  Temp(Src) 97.7 F (36.5 C) (Oral)  Wt 158 lb 4 oz (71.782 kg)  Physical Exam  Nursing note and vitals reviewed. Constitutional: She appears well-developed and well-nourished. No distress.  Musculoskeletal: She exhibits edema (2+ pitting bilaterally).  No erythema of legs today Drying scab present medial left leg Scab present medial left leg at site of recent bleed       Assessment & Plan:   Problem List Items Addressed This Visit   Venous ulcer of leg - Primary     Continue glucerna, continue daily dressing changes. No active bleed today. Will work towards improving pedal edema with change from hctz to lasix.    Pedal edema  Stop hctz - start stronger lasix 20mg  daily.  Check Cr/K next week.    Hot flashes     Continue slow taper - down to 0.5mg  1/2 tab QOD - pt agrees with plan.        Follow up plan: Return in about 1 week (around 02/03/2014), or as needed, for follow up visit.

## 2014-01-27 NOTE — Assessment & Plan Note (Addendum)
Continue slow taper - down to 0.5mg  1/2 tab QOD - pt agrees with plan.

## 2014-01-27 NOTE — Progress Notes (Signed)
Pre visit review using our clinic review tool, if applicable. No additional management support is needed unless otherwise documented below in the visit note. 

## 2014-01-28 ENCOUNTER — Ambulatory Visit: Payer: Medicare Other | Admitting: Family Medicine

## 2014-01-29 ENCOUNTER — Emergency Department (HOSPITAL_COMMUNITY)
Admission: EM | Admit: 2014-01-29 | Discharge: 2014-01-29 | Discharge status: Against Medical Advice | Payer: Medicare Other | Attending: Emergency Medicine | Admitting: Emergency Medicine

## 2014-01-29 DIAGNOSIS — I519 Heart disease, unspecified: Secondary | ICD-10-CM | POA: Insufficient documentation

## 2014-01-29 DIAGNOSIS — I868 Varicose veins of other specified sites: Secondary | ICD-10-CM | POA: Insufficient documentation

## 2014-01-29 DIAGNOSIS — I2510 Atherosclerotic heart disease of native coronary artery without angina pectoris: Secondary | ICD-10-CM | POA: Insufficient documentation

## 2014-01-29 DIAGNOSIS — G8929 Other chronic pain: Secondary | ICD-10-CM | POA: Insufficient documentation

## 2014-01-29 DIAGNOSIS — S99911A Unspecified injury of right ankle, initial encounter: Secondary | ICD-10-CM | POA: Diagnosis not present

## 2014-01-29 DIAGNOSIS — I10 Essential (primary) hypertension: Secondary | ICD-10-CM | POA: Diagnosis not present

## 2014-01-29 DIAGNOSIS — R58 Hemorrhage, not elsewhere classified: Secondary | ICD-10-CM | POA: Diagnosis not present

## 2014-01-29 DIAGNOSIS — E119 Type 2 diabetes mellitus without complications: Secondary | ICD-10-CM | POA: Diagnosis not present

## 2014-01-29 NOTE — ED Notes (Signed)
Patient wheeled to exit after leaving AMA

## 2014-01-29 NOTE — ED Notes (Signed)
Per ems- pt with hx of several bleeding varicose veins and is on Xerelto and baby Asprin. Pt has been refusing surgery. Bleeding is controlled at this time.

## 2014-01-29 NOTE — ED Notes (Signed)
Before triage completed family and pt wishing to leave. sts area is not bleeding so they want to leave. famliy given supplies in case bleeding returned.

## 2014-01-30 ENCOUNTER — Ambulatory Visit (INDEPENDENT_AMBULATORY_CARE_PROVIDER_SITE_OTHER): Payer: Medicare Other | Admitting: Family Medicine

## 2014-01-30 ENCOUNTER — Encounter: Payer: Self-pay | Admitting: Family Medicine

## 2014-01-30 VITALS — BP 116/50 | HR 80 | Temp 98.4°F | Wt 152.5 lb

## 2014-01-30 DIAGNOSIS — I872 Venous insufficiency (chronic) (peripheral): Secondary | ICD-10-CM

## 2014-01-30 DIAGNOSIS — I83029 Varicose veins of left lower extremity with ulcer of unspecified site: Principal | ICD-10-CM

## 2014-01-30 DIAGNOSIS — I83019 Varicose veins of right lower extremity with ulcer of unspecified site: Secondary | ICD-10-CM

## 2014-01-30 DIAGNOSIS — I2510 Atherosclerotic heart disease of native coronary artery without angina pectoris: Secondary | ICD-10-CM

## 2014-01-30 DIAGNOSIS — I824Z1 Acute embolism and thrombosis of unspecified deep veins of right distal lower extremity: Secondary | ICD-10-CM | POA: Diagnosis not present

## 2014-01-30 DIAGNOSIS — L97929 Non-pressure chronic ulcer of unspecified part of left lower leg with unspecified severity: Principal | ICD-10-CM

## 2014-01-30 DIAGNOSIS — L97919 Non-pressure chronic ulcer of unspecified part of right lower leg with unspecified severity: Principal | ICD-10-CM

## 2014-01-30 NOTE — Addendum Note (Signed)
Addended by: Ria Bush on: 01/30/2014 11:19 AM   Modules accepted: Orders

## 2014-01-30 NOTE — Progress Notes (Signed)
Pre visit review using our clinic review tool, if applicable. No additional management support is needed unless otherwise documented below in the visit note. 

## 2014-01-30 NOTE — Patient Instructions (Addendum)
Place unna boot today on right leg. If worsening foot pain or changing color, remove una boot sooner. Return on Monday for check up. Good to see you today, call us with questions.

## 2014-01-30 NOTE — Assessment & Plan Note (Signed)
Bleeding venous ulcers of R leg necessitating several ER visits and EMS visits over the last month - now bleeding has ceased but she continues to have significant issues with this. Agrees to return to vascular surgeon for further evaluation. Unna boot placed today - with extensive discussion on arterial insuff sxs/precautions. Pt and DIL aware of reasons to remove over weekend. Otherwise keep unna boot in place until f/u with me on Monday. Encouraged she start lasix 20mg  daily.

## 2014-01-30 NOTE — Progress Notes (Signed)
BP 116/50  Pulse 80  Temp(Src) 98.4 F (36.9 C) (Oral)  Wt 152 lb 8 oz (69.174 kg)  SpO2 99%   CC: f/u venous ulcer  Subjective:    [name], female    DOB: 1927/04/09, 80 y.o.   MRN: [id number]  HPI: [name] is a 80 y.o. female presenting on 01/30/2014 for check leg   See prior notes for details. Presents with sister in law today. Venous ulcer on R started bleeding last night. EMS called and taken to ER, but bleeding stopped and pt left prior to being evaluated. Mainly starts bleeding when compression stockings are removed at night time. ER records reviewed.  Has been evaluated at ER several times in last 2 months 2/2 continued venous bleeding. Has called EMS multiple times over last month with LE venous bleeds.  On xarelto for h/o distal RLE DVT (R proximal gastroc vein) started 12/2013. On aspirin for h/o CAD with CABG 1998.  She has not started lasix - prescribed Monday.  Slow taper off estrogen - down to 1/2 tab 0.5mg  QOD.  H/o severe hot flashes when taper has been tried in past.  She has recently started taking glucerna supplements for extra protein.  Wt Readings from Last 3 Encounters:  01/30/14 152 lb 8 oz (69.174 kg)  01/27/14 158 lb 4 oz (71.782 kg)  01/20/14 156 lb 12 oz (71.101 kg)     Relevant past medical, surgical, family and social history reviewed and updated as indicated.  Allergies and medications reviewed and updated. Current Outpatient Prescriptions on File Prior to Visit  Medication Sig  . ALPRAZolam (XANAX) 1 MG tablet Take 0.5 mg by mouth 4 (four) times daily. For anxiety  . Ascorbic Acid (VITAMIN C PO) Take 1,000 mg by mouth daily.   Marland Kitchen aspirin EC 81 MG tablet Take 81 mg by mouth daily.  Marland Kitchen CALCIUM PO Take 600 mg by mouth daily.   . Cyanocobalamin (VITAMIN B 12 PO) Take 1 tablet by mouth daily.  Marland Kitchen gabapentin (NEURONTIN) 100 MG capsule Take 100 mg by mouth at bedtime. For leg pain  . glucosamine-chondroitin 500-400 MG  tablet Take 1 tablet by mouth daily.   Marland Kitchen HYDROcodone-acetaminophen (NORCO/VICODIN) 5-325 MG per tablet Take 0.5-1 tablets by mouth every 6 (six) hours as needed for moderate pain.  . nitroGLYCERIN (NITROSTAT) 0.4 MG SL tablet Place 0.4 mg under the tongue every 5 (five) minutes as needed. For chest pain May take up to 3 doses, if pain persists call 911.  . pioglitazone (ACTOS) 30 MG tablet Take 30 mg by mouth daily. For diabetes  . traZODone (DESYREL) 150 MG tablet Take 150 mg by mouth at bedtime. For sleep  . triamcinolone cream (KENALOG) 0.1 % Apply 1 application topically daily.  Marland Kitchen VITAMIN E PO Take 1 tablet by mouth daily.   Marland Kitchen estradiol (ESTRACE) 0.5 MG tablet Take 0.5 tablets (0.25 mg total) by mouth every other day.   No current facility-administered medications on file prior to visit.    Review of Systems Per HPI unless specifically indicated above    Objective:    BP 116/50  Pulse 80  Temp(Src) 98.4 F (36.9 C) (Oral)  Wt 152 lb 8 oz (69.174 kg)  SpO2 99%  Physical Exam  Nursing note and vitals reviewed. Constitutional: She appears well-developed and well-nourished. No distress.  Musculoskeletal: She exhibits edema (R>L pedal edema).  1+ DP on right Vein has closed on medial right lower leg without  bleed       Assessment & Plan:   Problem List Items Addressed This Visit   Venous ulcer of leg - Primary     Bleeding venous ulcers of R leg necessitating several ER visits and EMS visits over the last month - now bleeding has ceased but she continues to have significant issues with this. Agrees to return to vascular surgeon for further evaluation. Unna boot placed today - with extensive discussion on arterial insuff sxs/precautions. Pt and DIL aware of reasons to remove over weekend. Otherwise keep unna boot in place until f/u with me on Monday. Encouraged she start lasix 20mg  daily.    Acute deep vein thrombosis (DVT) of distal vein of right lower extremity   Relevant  Medications      ramipril (ALTACE) 5 MG capsule      rivaroxaban (XARELTO) 20 MG TABS tablet      pravastatin (PRAVACHOL) 20 MG tablet      furosemide (LASIX) 20 MG tablet       Follow up plan: No Follow-up on file.

## 2014-02-03 ENCOUNTER — Ambulatory Visit: Payer: Medicare Other | Admitting: Family Medicine

## 2014-02-03 ENCOUNTER — Encounter: Payer: Self-pay | Admitting: Family Medicine

## 2014-02-03 ENCOUNTER — Ambulatory Visit (INDEPENDENT_AMBULATORY_CARE_PROVIDER_SITE_OTHER): Payer: Medicare Other | Admitting: Family Medicine

## 2014-02-03 ENCOUNTER — Telehealth: Payer: Self-pay | Admitting: Radiology

## 2014-02-03 VITALS — BP 110/60 | HR 84 | Temp 97.4°F | Wt 149.8 lb

## 2014-02-03 DIAGNOSIS — I10 Essential (primary) hypertension: Secondary | ICD-10-CM

## 2014-02-03 DIAGNOSIS — L97929 Non-pressure chronic ulcer of unspecified part of left lower leg with unspecified severity: Principal | ICD-10-CM

## 2014-02-03 DIAGNOSIS — E1140 Type 2 diabetes mellitus with diabetic neuropathy, unspecified: Secondary | ICD-10-CM

## 2014-02-03 DIAGNOSIS — I83019 Varicose veins of right lower extremity with ulcer of unspecified site: Secondary | ICD-10-CM

## 2014-02-03 DIAGNOSIS — D509 Iron deficiency anemia, unspecified: Secondary | ICD-10-CM

## 2014-02-03 DIAGNOSIS — R600 Localized edema: Secondary | ICD-10-CM | POA: Diagnosis not present

## 2014-02-03 DIAGNOSIS — I872 Venous insufficiency (chronic) (peripheral): Secondary | ICD-10-CM | POA: Diagnosis not present

## 2014-02-03 DIAGNOSIS — I2510 Atherosclerotic heart disease of native coronary artery without angina pectoris: Secondary | ICD-10-CM | POA: Diagnosis not present

## 2014-02-03 DIAGNOSIS — L97919 Non-pressure chronic ulcer of unspecified part of right lower leg with unspecified severity: Principal | ICD-10-CM

## 2014-02-03 DIAGNOSIS — I83029 Varicose veins of left lower extremity with ulcer of unspecified site: Principal | ICD-10-CM

## 2014-02-03 DIAGNOSIS — E1149 Type 2 diabetes mellitus with other diabetic neurological complication: Secondary | ICD-10-CM

## 2014-02-03 LAB — IBC PANEL
IRON: 14 ug/dL — AB (ref 42–145)
SATURATION RATIOS: 3.4 % — AB (ref 20.0–50.0)
Transferrin: 291.7 mg/dL (ref 212.0–360.0)

## 2014-02-03 LAB — RENAL FUNCTION PANEL
Albumin: 3 g/dL — ABNORMAL LOW (ref 3.5–5.2)
BUN: 34 mg/dL — AB (ref 6–23)
CO2: 24 mEq/L (ref 19–32)
CREATININE: 1.2 mg/dL (ref 0.4–1.2)
Calcium: 9.2 mg/dL (ref 8.4–10.5)
Chloride: 103 mEq/L (ref 96–112)
GFR: 47.23 mL/min — ABNORMAL LOW (ref >60.00)
GLUCOSE: 146 mg/dL — AB (ref 70–99)
Phosphorus: 3.1 mg/dL (ref 2.3–4.6)
Potassium: 4.6 mEq/L (ref 3.5–5.1)
Sodium: 136 mEq/L (ref 135–145)

## 2014-02-03 LAB — CBC WITH DIFFERENTIAL/PLATELET
BASOS ABS: 0.1 10*3/uL (ref 0.0–0.1)
Basophils Relative: 0.9 % (ref 0.0–3.0)
EOS PCT: 1.1 % (ref 0.0–5.0)
Eosinophils Absolute: 0.1 10*3/uL (ref 0.0–0.7)
HCT: 24.5 % — ABNORMAL LOW (ref 36.0–46.0)
Hemoglobin: 7.6 g/dL — CL (ref 12.0–15.0)
LYMPHS PCT: 15.8 % (ref 12.0–46.0)
Lymphs Abs: 1.3 10*3/uL (ref 0.7–4.0)
MCHC: 31.2 g/dL (ref 30.0–36.0)
MCV: 83.1 fl (ref 78.0–100.0)
MONOS PCT: 9.5 % (ref 3.0–12.0)
Monocytes Absolute: 0.8 10*3/uL (ref 0.1–1.0)
NEUTROS PCT: 72.7 % (ref 43.0–77.0)
Neutro Abs: 6.1 10*3/uL (ref 1.4–7.7)
PLATELETS: 341 10*3/uL (ref 150.0–400.0)
RBC: 2.95 Mil/uL — ABNORMAL LOW (ref 3.87–5.11)
RDW: 16 % — ABNORMAL HIGH (ref 11.5–15.5)
WBC: 8.4 10*3/uL (ref 4.0–10.5)

## 2014-02-03 LAB — FERRITIN: FERRITIN: 6.3 ng/mL — AB (ref 10.0–291.0)

## 2014-02-03 MED ORDER — ASPIRIN EC 81 MG PO TBEC: 81.0000 mg | DELAYED_RELEASE_TABLET | ORAL | Status: DC

## 2014-02-03 MED ORDER — HYDROCODONE-ACETAMINOPHEN 5-325 MG PO TABS: 0.5000 | ORAL_TABLET | Freq: Four times a day (QID) | ORAL | Status: DC | PRN

## 2014-02-03 MED ORDER — FERROUS SULFATE 325 (65 FE) MG PO TABS: 325.0000 mg | ORAL_TABLET | Freq: Two times a day (BID) | ORAL | Status: DC

## 2014-02-03 MED ORDER — ASPIRIN EC 81 MG PO TBEC
81.0000 mg | DELAYED_RELEASE_TABLET | ORAL | Status: DC
Start: 2014-02-03 — End: 2014-02-20

## 2014-02-03 NOTE — Assessment & Plan Note (Signed)
Will continue to monitor cbg and update me next week. Consider return to actos 45mg  higher dose. No changes today.

## 2014-02-03 NOTE — Assessment & Plan Note (Signed)
R wounds have markedly improved after 5d unna boot use. No need to re apply. F/u 1-2 wks - pt requests return next Monday for recheck.

## 2014-02-03 NOTE — Telephone Encounter (Signed)
[facility] called a critical HGB -7.6. Results given to Dr Danise Mina

## 2014-02-03 NOTE — Telephone Encounter (Signed)
plz notify patient - blood count has dropped due to bleeding ulcers and iron levels are very low.  I recommend she start iron tablet ferrous sulfate 325mg  twice daily with food and decrease aspirin to MWF while on xarelto. If she has new symptoms like chest pain or shortness of breath to let us know - may need blood transfusion.

## 2014-02-03 NOTE — Progress Notes (Signed)
Pre visit review using our clinic review tool, if applicable. No additional management support is needed unless otherwise documented below in the visit note. 

## 2014-02-03 NOTE — Progress Notes (Signed)
BP 110/60 mmHg  Pulse 84  Temp(Src) 97.4 F (36.3 C) (Oral)  Wt 149 lb 12 oz (67.926 kg)   CC: f/u visit leg ulcers  Subjective:    [name], female    DOB: 05-Jan-1928, 80 y.o.   MRN: [id number]  HPI: [name] is a 80 y.o. female presenting on 02/03/2014 for Follow-up   See prior notes for details. Wt Readings from Last 3 Encounters:  02/03/14 149 lb 12 oz (67.926 kg)  01/30/14 152 lb 8 oz (69.174 kg)  01/27/14 158 lb 4 oz (71.782 kg)  weight loss noted. Appetite down.   R leg unna boot placed last week (Thursday). She tolerated the boot well and this has sped up healing of venous ulcers. She has appt with [facility] 03/02/2014 Lab Results  Component Value Date   HGBA1C 6.6* 11/18/2013    She has noticed elevated sugars recently.  Relevant past medical, surgical, family and social history reviewed and updated as indicated.  Allergies and medications reviewed and updated. Current Outpatient Prescriptions on File Prior to Visit  Medication Sig  . ALPRAZolam (XANAX) 1 MG tablet Take 0.5 mg by mouth 4 (four) times daily. For anxiety  . Ascorbic Acid (VITAMIN C PO) Take 1,000 mg by mouth daily.   Marland Kitchen aspirin EC 81 MG tablet Take 81 mg by mouth daily.  Marland Kitchen CALCIUM PO Take 600 mg by mouth daily.   . Cyanocobalamin (VITAMIN B 12 PO) Take 1 tablet by mouth daily.  Marland Kitchen Dexlansoprazole (DEXILANT) 30 MG capsule TAKE 1 CAPSULE BY MOUTH DAILY for heartburn  . estradiol (ESTRACE) 0.5 MG tablet Take 0.5 tablets (0.25 mg total) by mouth every other day.  . gabapentin (NEURONTIN) 100 MG capsule Take 100 mg by mouth at bedtime. For leg pain  . glucosamine-chondroitin 500-400 MG tablet Take 1 tablet by mouth daily.   . nitroGLYCERIN (NITROSTAT) 0.4 MG SL tablet Place 0.4 mg under the tongue every 5 (five) minutes as needed. For chest pain May take up to 3 doses, if pain persists call 911.  . pioglitazone (ACTOS) 30 MG tablet Take 30 mg by mouth daily. For diabetes  .  pravastatin (PRAVACHOL) 20 MG tablet TAKE 1 TABLET BY MOUTH DAILY. for cholesterol  . ramipril (ALTACE) 5 MG capsule TAKE 1 CAPSULE BY MOUTH EVERY DAY for blood pressure  . traZODone (DESYREL) 150 MG tablet Take 150 mg by mouth at bedtime. For sleep  . triamcinolone cream (KENALOG) 0.1 % Apply 1 application topically daily.  Marland Kitchen VITAMIN E PO Take 1 tablet by mouth daily.   . furosemide (LASIX) 20 MG tablet Take 20 mg by mouth every Monday, Wednesday, and Friday. For leg swelling  . rivaroxaban (XARELTO) 20 MG TABS tablet Take 20 mg by mouth daily with supper. Blood thinner   No current facility-administered medications on file prior to visit.    Review of Systems Per HPI unless specifically indicated above    Objective:    BP 110/60 mmHg  Pulse 84  Temp(Src) 97.4 F (36.3 C) (Oral)  Wt 149 lb 12 oz (67.926 kg)  Physical Exam  Constitutional: She appears well-developed and well-nourished. No distress.  Musculoskeletal: She exhibits no edema (marked improvement).  Scab over prior R medial venous ulcer  Nursing note and vitals reviewed.      Assessment & Plan:   Problem List Items Addressed This Visit    Venous ulcer of leg - Primary    R wounds have markedly  improved after 5d unna boot use. No need to re apply. F/u 1-2 wks - pt requests return next Monday for recheck.    Type 2 diabetes mellitus with neurological manifestations, controlled    Will continue to monitor cbg and update me next week. Consider return to actos 45mg  higher dose. No changes today.    Pedal edema    Again marked improvement with lasix 20mg  daily for last 1-2 wks. Will check labs today (renal panel and CBC) and decrease lasix to MWF dosing.    Microcytic anemia   Relevant Orders      CBC with Differential      Ferritin      IBC panel   Hypertension   Relevant Orders      Renal function panel       Follow up plan: Return in about 1 week (around 02/10/2014), or as needed, for follow up visit.

## 2014-02-03 NOTE — Patient Instructions (Addendum)
Legs are looking better and healing well! Let's change lasix (furosemide) to 1 tablet on Monday, Wednesday and Friday. If blood pressure low, may hold this medicine. Continue xarelto. Return in 1-2 weeks for follow up. Keep legs up as much as able. Ok to return to work.

## 2014-02-03 NOTE — Assessment & Plan Note (Signed)
Again marked improvement with lasix 20mg  daily for last 1-2 wks. Will check labs today (renal panel and CBC) and decrease lasix to MWF dosing.

## 2014-02-04 ENCOUNTER — Encounter: Payer: Self-pay | Admitting: Vascular Surgery

## 2014-02-04 ENCOUNTER — Other Ambulatory Visit: Payer: Self-pay | Admitting: *Deleted

## 2014-02-04 DIAGNOSIS — L97219 Non-pressure chronic ulcer of right calf with unspecified severity: Principal | ICD-10-CM

## 2014-02-04 DIAGNOSIS — I83012 Varicose veins of right lower extremity with ulcer of calf: Secondary | ICD-10-CM

## 2014-02-04 DIAGNOSIS — R609 Edema, unspecified: Secondary | ICD-10-CM

## 2014-02-04 DIAGNOSIS — Z86718 Personal history of other venous thrombosis and embolism: Secondary | ICD-10-CM

## 2014-02-04 NOTE — Telephone Encounter (Signed)
Patient notified and verbalized understanding. 

## 2014-02-06 ENCOUNTER — Ambulatory Visit (INDEPENDENT_AMBULATORY_CARE_PROVIDER_SITE_OTHER)
Admission: RE | Admit: 2014-02-06 | Discharge: 2014-02-06 | Disposition: A | Discharge status: Home / Self Care | Payer: Medicare Other | Source: Ambulatory Visit | Attending: Family Medicine | Admitting: Family Medicine

## 2014-02-06 ENCOUNTER — Ambulatory Visit (INDEPENDENT_AMBULATORY_CARE_PROVIDER_SITE_OTHER): Payer: Medicare Other | Admitting: Family Medicine

## 2014-02-06 ENCOUNTER — Encounter: Payer: Self-pay | Admitting: Family Medicine

## 2014-02-06 VITALS — BP 126/56 | HR 72 | Temp 97.8°F | Resp 16 | Wt 155.2 lb

## 2014-02-06 DIAGNOSIS — M79671 Pain in right foot: Secondary | ICD-10-CM

## 2014-02-06 DIAGNOSIS — I2510 Atherosclerotic heart disease of native coronary artery without angina pectoris: Secondary | ICD-10-CM | POA: Diagnosis not present

## 2014-02-06 DIAGNOSIS — D509 Iron deficiency anemia, unspecified: Secondary | ICD-10-CM | POA: Diagnosis not present

## 2014-02-06 DIAGNOSIS — M19071 Primary osteoarthritis, right ankle and foot: Secondary | ICD-10-CM | POA: Diagnosis not present

## 2014-02-06 MED ORDER — DICLOFENAC SODIUM 1 % TD GEL: 1.0000 "application " | Freq: Three times a day (TID) | TRANSDERMAL | Status: DC

## 2014-02-06 NOTE — Progress Notes (Signed)
BP 126/56 mmHg  Pulse 72  Temp(Src) 97.8 F (36.6 C) (Oral)  Resp 16  Wt 155 lb 4 oz (70.421 kg)  SpO2 98%   CC: check foot  Subjective:    [name], female    DOB: 1927-10-07, 80 y.o.   MRN: [id number]  HPI: [name] is a 80 y.o. female presenting on 02/06/2014 for Foot Pain   Worsening pain at instep of R foot. Trouble sleeping 2/2 pain. Worse pain with stretching leg.   Leg unna boot placed last week (Thursday). She tolerated the boot well and this has sped up healing of venous ulcers. She has appt with [facility] 03/02/2014  Recently decreased lasix to MWF dosing.   Recently found to have worsening microcytic anemia with Hgb to 7.6 - on Monday we restarted iron tablet bid. She forgot today's dose.  Relevant past medical, surgical, family and social history reviewed and updated as indicated.  Allergies and medications reviewed and updated. Current Outpatient Prescriptions on File Prior to Visit  Medication Sig  . ALPRAZolam (XANAX) 1 MG tablet Take 0.5 mg by mouth 4 (four) times daily. For anxiety  . Ascorbic Acid (VITAMIN C PO) Take 1,000 mg by mouth daily.   Marland Kitchen aspirin EC 81 MG tablet Take 1 tablet (81 mg total) by mouth every Monday, Wednesday, and Friday.  Marland Kitchen CALCIUM PO Take 600 mg by mouth daily.   . Cyanocobalamin (VITAMIN B 12 PO) Take 1 tablet by mouth daily.  Marland Kitchen Dexlansoprazole (DEXILANT) 30 MG capsule TAKE 1 CAPSULE BY MOUTH DAILY for heartburn  . estradiol (ESTRACE) 0.5 MG tablet Take 0.5 tablets (0.25 mg total) by mouth every other day.  . ferrous sulfate 325 (65 FE) MG tablet Take 1 tablet (325 mg total) by mouth 2 (two) times daily with a meal.  . furosemide (LASIX) 20 MG tablet Take 20 mg by mouth every Monday, Wednesday, and Friday. For leg swelling  . gabapentin (NEURONTIN) 100 MG capsule Take 100 mg by mouth at bedtime. For leg pain  . glucosamine-chondroitin 500-400 MG tablet Take 1 tablet by mouth daily.   Marland Kitchen HYDROcodone-acetaminophen  (NORCO/VICODIN) 5-325 MG per tablet Take 0.5-1 tablets by mouth every 6 (six) hours as needed for moderate pain.  . nitroGLYCERIN (NITROSTAT) 0.4 MG SL tablet Place 0.4 mg under the tongue every 5 (five) minutes as needed. For chest pain May take up to 3 doses, if pain persists call 911.  . pioglitazone (ACTOS) 30 MG tablet Take 30 mg by mouth daily. For diabetes  . pravastatin (PRAVACHOL) 20 MG tablet TAKE 1 TABLET BY MOUTH DAILY. for cholesterol  . ramipril (ALTACE) 5 MG capsule TAKE 1 CAPSULE BY MOUTH EVERY DAY for blood pressure  . rivaroxaban (XARELTO) 20 MG TABS tablet Take 20 mg by mouth daily with supper. Blood thinner  . traZODone (DESYREL) 150 MG tablet Take 150 mg by mouth at bedtime. For sleep  . triamcinolone cream (KENALOG) 0.1 % Apply 1 application topically daily.  Marland Kitchen VITAMIN E PO Take 1 tablet by mouth daily.    No current facility-administered medications on file prior to visit.    Review of Systems Per HPI unless specifically indicated above    Objective:    BP 126/56 mmHg  Pulse 72  Temp(Src) 97.8 F (36.6 C) (Oral)  Resp 16  Wt 155 lb 4 oz (70.421 kg)  SpO2 98%  Physical Exam  Constitutional: She appears well-developed and well-nourished. No distress.  Musculoskeletal: She exhibits  no edema.  R midfoot pain to palpation without erythema.  Chronically diminished pulses bilaterally.  Nursing note and vitals reviewed.  Results for orders placed or performed in visit on 02/03/14  CBC with Differential  Result Value Ref Range   WBC 8.4 4.0 - 10.5 K/uL   RBC 2.95 (L) 3.87 - 5.11 Mil/uL   Hemoglobin 7.6 Repeated and verified X2. (LL) 12.0 - 15.0 g/dL   HCT 24.5 (L) 36.0 - 46.0 %   MCV 83.1 78.0 - 100.0 fl   MCHC 31.2 30.0 - 36.0 g/dL   RDW 16.0 (H) 11.5 - 15.5 %   Platelets 341.0 150.0 - 400.0 K/uL   Neutrophils Relative % 72.7 43.0 - 77.0 %   Lymphocytes Relative 15.8 12.0 - 46.0 %   Monocytes Relative 9.5 3.0 - 12.0 %   Eosinophils Relative 1.1 0.0 - 5.0 %    Basophils Relative 0.9 0.0 - 3.0 %   Neutro Abs 6.1 1.4 - 7.7 K/uL   Lymphs Abs 1.3 0.7 - 4.0 K/uL   Monocytes Absolute 0.8 0.1 - 1.0 K/uL   Eosinophils Absolute 0.1 0.0 - 0.7 K/uL   Basophils Absolute 0.1 0.0 - 0.1 K/uL  Ferritin  Result Value Ref Range   Ferritin 6.3 (L) 10.0 - 291.0 ng/mL  IBC panel  Result Value Ref Range   Iron 14 (L) 42 - 145 ug/dL   Transferrin 291.7 212.0 - 360.0 mg/dL   Saturation Ratios 3.4 (L) 20.0 - 50.0 %  Renal function panel  Result Value Ref Range   Sodium 136 135 - 145 mEq/L   Potassium 4.6 3.5 - 5.1 mEq/L   Chloride 103 96 - 112 mEq/L   CO2 24 19 - 32 mEq/L   Calcium 9.2 8.4 - 10.5 mg/dL   Albumin 3.0 (L) 3.5 - 5.2 g/dL   BUN 34 (H) 6 - 23 mg/dL   Creatinine, Ser 1.2 0.4 - 1.2 mg/dL   Glucose, Bld 146 (H) 70 - 99 mg/dL   Phosphorus 3.1 2.3 - 4.6 mg/dL   GFR 47.23 (L) >60.00 mL/min      Assessment & Plan:   Problem List Items Addressed This Visit    Microcytic anemia    Marked anemia found on check 3d ago (Hgb 7.6). Significant comorbidities including CAD and CKD but patient currently asxs from anemia standpoint and hesitant for transfusion. Discussed red flags to notify us immediately to require transfusion - chest pain, dyspnea, or persistent dizziness.    Foot pain, right - Primary    Unclear etiology - not consistent with fracture, no evidence of infection or gout, present for 1+ month. Previously treated with voltaren gel, unsure if she ever tried this. Xray today to r/o fracture - difficulty getting quality picture 2/2 arthritis of other joints limiting positioning. Anticipate foot tendonitis - treat with restarting voltaren gel, update if pain not controlled. Discussed ok to take extra hydrocodone as well, will refill early next refill if needed.    Relevant Orders      DG Foot 2 Views Right       Follow up plan: Return in about 1 week (around 02/13/2014).

## 2014-02-06 NOTE — Assessment & Plan Note (Addendum)
Unclear etiology - not consistent with fracture, no evidence of infection or gout, present for 1+ month. Previously treated with voltaren gel, unsure if she ever tried this. Xray today to r/o fracture - difficulty getting quality picture 2/2 arthritis of other joints limiting positioning. Anticipate foot tendonitis - treat with restarting voltaren gel, update if pain not controlled. Discussed ok to take extra hydrocodone as well, will refill early next refill if needed.

## 2014-02-06 NOTE — Assessment & Plan Note (Signed)
Marked anemia found on check 3d ago (Hgb 7.6). Significant comorbidities including CAD and CKD but patient currently asxs from anemia standpoint and hesitant for transfusion. Discussed red flags to notify us immediately to require transfusion - chest pain, dyspnea, or persistent dizziness.

## 2014-02-06 NOTE — Patient Instructions (Signed)
Xray looking ok today. I think you have tendonitis of foot. Use voltaren cream on right inner midfoot three times daily, don't use on wounds. Keep foot elevated. For anemia - if any worsening dizziness, chest pain or shortness of breath, let us know to schedule transfusion. Take 2 iron pills daily. We will recheck blood levels in 1-2 weeks.

## 2014-02-06 NOTE — Progress Notes (Signed)
Pre visit review using our clinic review tool, if applicable. No additional management support is needed unless otherwise documented below in the visit note. 

## 2014-02-10 ENCOUNTER — Ambulatory Visit: Payer: Medicare Other | Admitting: Family Medicine

## 2014-02-10 ENCOUNTER — Encounter (HOSPITAL_COMMUNITY): Payer: Medicare Other

## 2014-02-10 ENCOUNTER — Ambulatory Visit: Payer: Medicare Other | Admitting: Vascular Surgery

## 2014-02-11 ENCOUNTER — Ambulatory Visit (INDEPENDENT_AMBULATORY_CARE_PROVIDER_SITE_OTHER): Payer: Medicare Other | Admitting: Cardiovascular Disease

## 2014-02-11 ENCOUNTER — Encounter: Payer: Self-pay | Admitting: Cardiovascular Disease

## 2014-02-11 VITALS — BP 106/60 | HR 78 | Ht <= 58 in | Wt 157.8 lb

## 2014-02-11 DIAGNOSIS — I25810 Atherosclerosis of coronary artery bypass graft(s) without angina pectoris: Secondary | ICD-10-CM | POA: Diagnosis not present

## 2014-02-11 DIAGNOSIS — I2510 Atherosclerotic heart disease of native coronary artery without angina pectoris: Secondary | ICD-10-CM

## 2014-02-11 NOTE — Progress Notes (Signed)
[place] Date of Birth  [birth year] [facility] [address]    [place], [place]  [phone]  Fax  [phone]   Problem list 1. Coronary artery disease-status post CABG in 1996 2. Diabetes mellitus 3. Hypertension 4. Hyperlipidemia 5. Right  DVT.   History of Present Illness:  [name] is an 80 yo female with a Hx of CAD, CABG ( November 09, 1994), diabetes mellitus, anxiety, HTN, hyperlipidemia.   She's been under a lot of stress recently. Her son died last year. She's under a lot of stress with the extra expenses involved such as his funeral. She presented with some CP in December.  A stress myoview revealed lateral ischemia.  We offered cardiac cath but she did not want to have the cath. She presents today without any particular complaints. She has not had any episodes of chest pain or shortness of breath since I saw her a month ago. She does have occasional episodes of tachycardia. She also notes that she has some anxiety. She was last seen in January after having an abnormal stress test that revealed anterior apical ischemia. She refused cardiac catheterization at that time. She has not had any recent episodes of chest pain.  She does not want to have a cardiac catheterization since she's feeling so well.  She has had some chest burning that is nearly constant.  I suggest several times that she should consider getting the cath done and she changed to subject.  September 04, 2012:  [name] is doing well from a cardiac standpoint.  She is still working - despite having lots of back pain.  No CP .  She had some CP several years ago and had a mildly abnormal myoview.  She did not want to have a cardiac cath at that time and has not had any significant epidoses of CP since that time.  She continues to be under lots of stress  She complains of back pain, stomach pain, burning in her legs.    Nov. 24, 2014:  Pt is doing OK from a cardiac standpoint.   Lots of stress - she has been running the [employer] by herself for the past several months.  She is not getting enough rest.  Sleeping 4 1/2 hours at night - tries to get a nap for an hour.    September 09, 2013:  [name] is doing ok.  She is under lots of stress as usual.    Nov. 10, 2015:  [name] presents with various aches and pains.  She has a vericose vein in her leg that bled. She is on Xarelto for a right leg DVT on Sept. 30. .   CT angio a week later was negative for PE.   She has lots of anxiety.  Has lots of chest pain.  The pain is not pleuretic.  The pain is not associated with exercise.  She does not have any pain while she is working but more often occurs with stress.   She is living with 2 elderly men now and has lots of stressful issues relating to that.    This seems to be a major cause of her pain.  She does have 78 year old SVGs  Current Outpatient Prescriptions on File Prior to Visit  Medication Sig Dispense Refill  . ALPRAZolam (XANAX) 1 MG tablet Take 0.5 mg by mouth 4 (four) times daily. For anxiety    . Ascorbic Acid (VITAMIN C PO)  Take 1,000 mg by mouth daily.     Marland Kitchen aspirin EC 81 MG tablet Take 1 tablet (81 mg total) by mouth every Monday, Wednesday, and Friday.    Marland Kitchen CALCIUM PO Take 600 mg by mouth daily.     . Cyanocobalamin (VITAMIN B 12 PO) Take 1 tablet by mouth daily.    Marland Kitchen Dexlansoprazole (DEXILANT) 30 MG capsule TAKE 1 CAPSULE BY MOUTH DAILY for heartburn    . diclofenac sodium (VOLTAREN) 1 % GEL Apply 1 application topically 3 (three) times daily. 1 Tube 1  . estradiol (ESTRACE) 0.5 MG tablet Take 0.5 tablets (0.25 mg total) by mouth every other day.    . ferrous sulfate 325 (65 FE) MG tablet Take 1 tablet (325 mg total) by mouth 2 (two) times daily with a meal.  3  . furosemide (LASIX) 20 MG tablet Take 20 mg by mouth every Monday, Wednesday, and Friday. For leg swelling    . gabapentin (NEURONTIN) 100 MG capsule Take 100 mg by mouth at bedtime. For leg  pain    . glucosamine-chondroitin 500-400 MG tablet Take 1 tablet by mouth daily.     Marland Kitchen HYDROcodone-acetaminophen (NORCO/VICODIN) 5-325 MG per tablet Take 0.5-1 tablets by mouth every 6 (six) hours as needed for moderate pain. 30 tablet 0  . nitroGLYCERIN (NITROSTAT) 0.4 MG SL tablet Place 0.4 mg under the tongue every 5 (five) minutes as needed. For chest pain May take up to 3 doses, if pain persists call 911.    . pioglitazone (ACTOS) 30 MG tablet Take 30 mg by mouth daily. For diabetes    . pravastatin (PRAVACHOL) 20 MG tablet TAKE 1 TABLET BY MOUTH DAILY. for cholesterol    . ramipril (ALTACE) 5 MG capsule TAKE 1 CAPSULE BY MOUTH EVERY DAY for blood pressure    . rivaroxaban (XARELTO) 20 MG TABS tablet Take 20 mg by mouth daily with supper. Blood thinner    . traZODone (DESYREL) 150 MG tablet Take 150 mg by mouth at bedtime. For sleep    . triamcinolone cream (KENALOG) 0.1 % Apply 1 application topically daily.    Marland Kitchen VITAMIN E PO Take 1 tablet by mouth daily.      No current facility-administered medications on file prior to visit.    Allergies  Allergen Reactions  . Amoxicillin-Pot Clavulanate Diarrhea  . Metformin And Related Diarrhea    Past Medical History  Diagnosis Date  . Hypertension   . Coronary artery disease     CABG 1998  . Anxiety   . Diastolic dysfunction   . Hyperlipidemia   . PUD (peptic ulcer disease)   . Microcytic anemia   . Depression   . Diabetes mellitus     type 2, followed by endo.  . Diverticulosis   . Hemorrhoids   . History of heart attack   . Irritable bowel syndrome   . Chronic back pain 03/2012    lumbar DDD with herniation - s/p L S1 transforaminal ESI (10/2012 Dr. Sharlet Salina)  . Varicose veins   . Cystocele   . Hx of echocardiogram     a. Echo 10/13: mild LVH, EF 55-60%, Gr 1 diast dysfn, PASP 32  . Acute deep vein thrombosis (DVT) of distal vein of right lower extremity 12/2013    Acute thrombus of R proximal gastrocnemius vein. Symptomatic  provoked distal DVT  . Cellulitis of leg, left 01/16/2014    Past Surgical History  Procedure Laterality Date  . Tympanoplasty  1960s  R side  . Coronary artery bypass graft  1998  . Cataract surgery    . Cholecystectomy    . Appendectomy      per pt report  . Colonoscopy  02/2010    extensive diverticulosis throughout, small internal hemorrhoids, rec rpt 5 yrs (Dr. Allyn Kenner)  . Hand surgery    . Lexiscan myoview  03/2011    Small basal inferolateral and anterolateral reversible perfusion defect suggests ischemia.  EF was normal.   old infarct, no new ischemia  . Toe surgery    . Abdominal hysterectomy  1975    TAH,BSO  . Vault prolapse a & p repair  2009    Dr [facility] hospital  . Dexa scan  09/2011    femur -0.8, forearm 0.6 => normal  . Esi  2014    L S1 transforaminal ESI x2 ([facility]) without improvement    History  Smoking status  . Former Smoker  Smokeless tobacco  . Never Used    History  Alcohol Use No    Family History  Problem Relation Age of Onset  . Hypertension Mother   . Stroke Mother   . Diabetes Father   . Diabetes Sister   . Diabetes Brother   . Colon cancer Neg Hx     Reviw of Systems:  Reviewed in the HPI.  All other systems are negative.  Physical Exam: BP 106/60 mmHg  Pulse 78  Ht 4\' 10"  (1.473 m)  Wt 157 lb 12.8 oz (71.578 kg)  BMI 32.99 kg/m2 The patient is alert and oriented x 3.  The mood and affect are normal.   Skin: warm and dry.  Color is normal.    HEENT:   Normocephalic/atraumatic. Neck is supple. Her mucous membranes are moist. The JVD. Her carotids are normal.  Lungs: Lung exam is clear.   She has a slight reddish rash on her upper chest.    Heart: Regular rate S1-S2. She has no murmurs.    Abdomen: Abdomen soft.  Extremities:  No clubbing cyanosis or edema  Neuro:  Exam is nonfocal.    ECG: September 09, 2013:  Normal sinus rhythm at 75. She has no ST or T wave changes.   Assessment / Plan:

## 2014-02-11 NOTE — Assessment & Plan Note (Addendum)
Mr. Joen Laura for further evaluation of her chest pain. Her chest pains are somewhat atypical although she does have a history of bypass grafting approximately 18-19 years. She has epigastric pain that radiates up into her throat.  I would like to  perform a 2 day Verdel study for further evaluation. We will need to perform this as a 2 day study because of her body habitus. She's had 1 days studies in  the past and had significant breast attenuation   I will see her in 6 months.    Sooner if needed.

## 2014-02-11 NOTE — Patient Instructions (Signed)
Your physician recommends that you continue on your current medications as directed. Please refer to the Current Medication list given to you today.  Your physician has requested that you have a lexiscan myoview. For further information please visit [email or web address]. Please follow instruction sheet, as given.  Your physician wants you to follow-up in: 6 months with Dr. Acie Fredrickson.  You will receive a reminder letter in the mail two months in advance. If you don't receive a letter, please call our office to schedule the follow-up appointment.

## 2014-02-12 ENCOUNTER — Telehealth: Payer: Self-pay | Admitting: Family Medicine

## 2014-02-12 NOTE — Telephone Encounter (Signed)
Keep it bandaged  Elevate if she can  If increased redness/pain /fever or other symptoms- go to ER or UC tonight  Otherwise schedule with first avail tomorrow

## 2014-02-12 NOTE — Telephone Encounter (Addendum)
plz call pt to f/u - if persistent weeping legs may schedule at 1:15pm today.

## 2014-02-12 NOTE — Telephone Encounter (Signed)
Pt notified of Dr. Marliss Coots comments/recommendations. Pt declined to schedule appt with 1st avail provider tomorrow because she only wants to see Dr. Darnell Level, I advise pt that Dr. Darnell Level was full but I could schedule her an appt with another provider, pt said to send this message to Dr. Darnell Level and Maudie Mercury because they will work her in

## 2014-02-12 NOTE — Telephone Encounter (Signed)
Patient Information:  Caller Name: Jaedyn  Phone: [phone]  Patient: [name]  Gender: Female  DOB: 01/03/1928  Age: 80 Years  PCP: Ria Bush [facility])  Office Follow Up:  Does the office need to follow up with this patient?: Yes  Instructions For The Office: Patient cannot come to office today. 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  She desribes "leaking fluid". She is going to bandage the area and call the office in the morning.  RN Note:  Patient cannot come to office today. 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  She desribes "leaking fluid". She is going to bandage the area and call the office in the morning.  Symptoms  Reason For Call & Symptoms: Patient states she had a "vein bust two weeks ago in her right lower leg". in October 2015.  Marland Kitchen She has history of Venous Stasis.  She states today her Right anklel  it is "leaking water" clear fluid. No bleeding. The area is red, but no sore. No heat or warmth. No pain . Right leg is swollen from ankle to knee ongoing per patient. She is on lasix - M, W, F.   Afebrile.  Reviewed Health History In EMR: Yes  Reviewed Medications In EMR: Yes  Reviewed Allergies In EMR: Yes  Reviewed Surgeries / Procedures: Yes  Date of Onset of Symptoms: 02/12/2014  Guideline(s) Used:  Leg Swelling and Edema  Disposition Per Guideline:   See Today in Office  Reason For Disposition Reached:   Patient wants to be seen  Advice Given:  Varicose Veins:  Appear as bulging winding ("worm-like") blue blood vessels in the thigh and lower leg. Patients with varicose veins often report a mild swelling in their legs after a prolonged day of standing or walking. The swelling usually subsides with rest and leg elevation.  Varicose Veins - Treatment:  Try to rest and elevate your legs above your heart a couple times each day for 15 minutes.  Walking is good  for your blood flow (Reason: helps pump the blood out of the veins).  Avoid prolonged standing in one place.  Support hose may be helpful. Put them on first thing in the morning when the swelling is least.  Call Back If:  Swelling becomes worse  Swelling becomes red or painful to the touch  Calf pain occurs and becomes constant  You become worse.  Patient Refused Recommendation:  Patient Will Follow Up With Office Later  Patient cannot come to office today. 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  She desribes "leaking fluid". She is going to bandage the area and call the office in the morning.

## 2014-02-13 ENCOUNTER — Encounter: Payer: Self-pay | Admitting: Family Medicine

## 2014-02-13 ENCOUNTER — Ambulatory Visit: Payer: Medicare Other | Admitting: Family Medicine

## 2014-02-13 ENCOUNTER — Ambulatory Visit (INDEPENDENT_AMBULATORY_CARE_PROVIDER_SITE_OTHER): Payer: Medicare Other | Admitting: Family Medicine

## 2014-02-13 VITALS — BP 122/64 | HR 80 | Temp 98.0°F | Wt 158.2 lb

## 2014-02-13 DIAGNOSIS — I83892 Varicose veins of left lower extremities with other complications: Secondary | ICD-10-CM | POA: Insufficient documentation

## 2014-02-13 DIAGNOSIS — I824Z1 Acute embolism and thrombosis of unspecified deep veins of right distal lower extremity: Secondary | ICD-10-CM | POA: Diagnosis not present

## 2014-02-13 DIAGNOSIS — I83891 Varicose veins of right lower extremities with other complications: Secondary | ICD-10-CM | POA: Diagnosis not present

## 2014-02-13 DIAGNOSIS — I2510 Atherosclerotic heart disease of native coronary artery without angina pectoris: Secondary | ICD-10-CM | POA: Diagnosis not present

## 2014-02-13 DIAGNOSIS — D509 Iron deficiency anemia, unspecified: Secondary | ICD-10-CM

## 2014-02-13 NOTE — Progress Notes (Signed)
BP 122/64 mmHg  Pulse 80  Temp(Src) 98 F (36.7 C) (Oral)  Wt 158 lb 4 oz (71.782 kg)   CC: leg weeping  Subjective:    [name], female    DOB: 1927/09/06, 80 y.o.   MRN: [id number]  HPI: [name] is a 80 y.o. female presenting on 02/13/2014 for Leg Swelling   See prior notes for details. Last seen here 1 wk ago with improving leg swelling, we decreased lasix 20mg  to MWF dosing.  Voltaren gel was also started for R mid foot arthralgia.  Yesterday started noticing R leg swelling and weeping at abrasion she caused medial upper leg after taking compression stockings off. No bleeding.  Relevant past medical, surgical, family and social history reviewed and updated as indicated.  Allergies and medications reviewed and updated. Current Outpatient Prescriptions on File Prior to Visit  Medication Sig  . ALPRAZolam (XANAX) 1 MG tablet Take 0.5 mg by mouth 4 (four) times daily. For anxiety  . Ascorbic Acid (VITAMIN C PO) Take 1,000 mg by mouth daily.   Marland Kitchen aspirin EC 81 MG tablet Take 1 tablet (81 mg total) by mouth every Monday, Wednesday, and Friday. (Patient taking differently: Take 81 mg by mouth daily. )  . CALCIUM PO Take 600 mg by mouth daily.   . Cyanocobalamin (VITAMIN B 12 PO) Take 1 tablet by mouth daily.  Marland Kitchen Dexlansoprazole (DEXILANT) 30 MG capsule TAKE 1 CAPSULE BY MOUTH DAILY for heartburn  . diclofenac sodium (VOLTAREN) 1 % GEL Apply 1 application topically 3 (three) times daily.  Marland Kitchen estradiol (ESTRACE) 0.5 MG tablet Take 0.5 tablets (0.25 mg total) by mouth every other day.  . ferrous sulfate 325 (65 FE) MG tablet Take 1 tablet (325 mg total) by mouth 2 (two) times daily with a meal.  . furosemide (LASIX) 20 MG tablet Take 20 mg by mouth daily. For leg swelling  . gabapentin (NEURONTIN) 100 MG capsule Take 100 mg by mouth at bedtime. For leg pain  . glucosamine-chondroitin 500-400 MG tablet Take 1 tablet by mouth daily.   Marland Kitchen  HYDROcodone-acetaminophen (NORCO/VICODIN) 5-325 MG per tablet Take 0.5-1 tablets by mouth every 6 (six) hours as needed for moderate pain.  . nitroGLYCERIN (NITROSTAT) 0.4 MG SL tablet Place 0.4 mg under the tongue every 5 (five) minutes as needed. For chest pain May take up to 3 doses, if pain persists call 911.  . pioglitazone (ACTOS) 30 MG tablet Take 30 mg by mouth daily. For diabetes  . pravastatin (PRAVACHOL) 20 MG tablet TAKE 1 TABLET BY MOUTH DAILY. for cholesterol  . ramipril (ALTACE) 5 MG capsule TAKE 1 CAPSULE BY MOUTH EVERY DAY for blood pressure  . traZODone (DESYREL) 150 MG tablet Take 150 mg by mouth at bedtime. For sleep  . triamcinolone cream (KENALOG) 0.1 % Apply 1 application topically daily.  Marland Kitchen VITAMIN E PO Take 1 tablet by mouth daily.    No current facility-administered medications on file prior to visit.   Past Medical History  Diagnosis Date  . Hypertension   . Coronary artery disease     CABG 1998  . Anxiety   . Diastolic dysfunction   . Hyperlipidemia   . PUD (peptic ulcer disease)   . Microcytic anemia   . Depression   . Diabetes mellitus     type 2, followed by endo.  . Diverticulosis   . Hemorrhoids   . History of heart attack   . Irritable bowel syndrome   .  Chronic back pain 03/2012    lumbar DDD with herniation - s/p L S1 transforaminal ESI (10/2012 Dr. Sharlet Salina)  . Varicose veins   . Cystocele   . Hx of echocardiogram     a. Echo 10/13: mild LVH, EF 55-60%, Gr 1 diast dysfn, PASP 32  . Acute deep vein thrombosis (DVT) of distal vein of right lower extremity 12/2013    Acute thrombus of R proximal gastrocnemius vein. Symptomatic provoked distal DVT  . Cellulitis of leg, left 01/16/2014    Review of Systems Per HPI unless specifically indicated above    Objective:    BP 122/64 mmHg  Pulse 80  Temp(Src) 98 F (36.7 C) (Oral)  Wt 158 lb 4 oz (71.782 kg)  Physical Exam  Constitutional: She appears well-developed and well-nourished. No  distress.  Musculoskeletal: She exhibits edema (R>L).  While removing lower leg bandage, wound lower medial leg started bleeding continually, soaking through several gauze.  Nursing note and vitals reviewed.  Results for orders placed or performed in visit on 02/03/14  CBC with Differential  Result Value Ref Range   WBC 8.4 4.0 - 10.5 K/uL   RBC 2.95 (L) 3.87 - 5.11 Mil/uL   Hemoglobin 7.6 Repeated and verified X2. (LL) 12.0 - 15.0 g/dL   HCT 24.5 (L) 36.0 - 46.0 %   MCV 83.1 78.0 - 100.0 fl   MCHC 31.2 30.0 - 36.0 g/dL   RDW 16.0 (H) 11.5 - 15.5 %   Platelets 341.0 150.0 - 400.0 K/uL   Neutrophils Relative % 72.7 43.0 - 77.0 %   Lymphocytes Relative 15.8 12.0 - 46.0 %   Monocytes Relative 9.5 3.0 - 12.0 %   Eosinophils Relative 1.1 0.0 - 5.0 %   Basophils Relative 0.9 0.0 - 3.0 %   Neutro Abs 6.1 1.4 - 7.7 K/uL   Lymphs Abs 1.3 0.7 - 4.0 K/uL   Monocytes Absolute 0.8 0.1 - 1.0 K/uL   Eosinophils Absolute 0.1 0.0 - 0.7 K/uL   Basophils Absolute 0.1 0.0 - 0.1 K/uL  Ferritin  Result Value Ref Range   Ferritin 6.3 (L) 10.0 - 291.0 ng/mL  IBC panel  Result Value Ref Range   Iron 14 (L) 42 - 145 ug/dL   Transferrin 291.7 212.0 - 360.0 mg/dL   Saturation Ratios 3.4 (L) 20.0 - 50.0 %  Renal function panel  Result Value Ref Range   Sodium 136 135 - 145 mEq/L   Potassium 4.6 3.5 - 5.1 mEq/L   Chloride 103 96 - 112 mEq/L   CO2 24 19 - 32 mEq/L   Calcium 9.2 8.4 - 10.5 mg/dL   Albumin 3.0 (L) 3.5 - 5.2 g/dL   BUN 34 (H) 6 - 23 mg/dL   Creatinine, Ser 1.2 0.4 - 1.2 mg/dL   Glucose, Bld 146 (H) 70 - 99 mg/dL   Phosphorus 3.1 2.3 - 4.6 mg/dL   GFR 47.23 (L) >60.00 mL/min      Assessment & Plan:   Problem List Items Addressed This Visit    Microcytic anemia    Continue iron daily.    Bleeding from varicose veins of right lower extremity - Primary    Significant bleed that finally slowed down with 20+min pressure, dressed in compression bandage and advised to keep bandage on  until f/u in 4-5 days. She has had recurrent issues with bleeding over last several months, complicated by xarelto use - recommended stop xarelto and continue aspirin. Recheck next visit. In interim, will  also work towards improved pedal edema with increasing lasix to 20mg  daily. A total of 35 minutes were spent face-to-face with the patient during this encounter and over half of that time was spent on counseling and coordination of care     Acute deep vein thrombosis (DVT) of distal vein of right lower extremity    xarelto use complicated by recurrent venous bleeds of lower extremities and anemia. Weighing pros and cons, today recommended pt stop xarelto, continue aspirin 81mg  daily.  Consider rpt [place] in next month to re assess clot burden.        Follow up plan: Return if symptoms worsen or fail to improve.

## 2014-02-13 NOTE — Assessment & Plan Note (Signed)
Continue iron daily.

## 2014-02-13 NOTE — Telephone Encounter (Signed)
Pt called this morning and legs did not weep a lot last night but is painful and pt does want to be seen by Dr Darnell Level. Pt scheduled appt today at 1:15 pm.

## 2014-02-13 NOTE — Assessment & Plan Note (Addendum)
Significant bleed that finally slowed down with 20+min pressure, dressed in compression bandage and advised to keep bandage on until f/u in 4-5 days. She has had recurrent issues with bleeding over last several months, complicated by xarelto use - recommended stop xarelto and continue aspirin. Recheck next visit. In interim, will also work towards improved pedal edema with increasing lasix to 20mg  daily. A total of 35 minutes were spent face-to-face with the patient during this encounter and over half of that time was spent on counseling and coordination of care

## 2014-02-13 NOTE — Progress Notes (Signed)
Pre visit review using our clinic review tool, if applicable. No additional management support is needed unless otherwise documented below in the visit note. 

## 2014-02-13 NOTE — Patient Instructions (Addendum)
Stop xarelto. Restart aspirin every day and lasix 20mg  every day. Keep bandage on leg until follow up visit. Continue iron daily If bleeding through bandage, seek care at ER or urgent care. Return Monday or Tuesday afternoon for follow up.

## 2014-02-13 NOTE — Assessment & Plan Note (Addendum)
xarelto use complicated by recurrent venous bleeds of lower extremities and anemia. Weighing pros and cons, today recommended pt stop xarelto, continue aspirin 81mg  daily.  Consider rpt [place] in next month to re assess clot burden.

## 2014-02-14 ENCOUNTER — Encounter: Payer: Self-pay | Admitting: Family Medicine

## 2014-02-14 ENCOUNTER — Ambulatory Visit (INDEPENDENT_AMBULATORY_CARE_PROVIDER_SITE_OTHER): Payer: Medicare Other | Admitting: Family Medicine

## 2014-02-14 VITALS — BP 128/60 | HR 84 | Temp 98.1°F | Wt 158.5 lb

## 2014-02-14 DIAGNOSIS — I83891 Varicose veins of right lower extremities with other complications: Secondary | ICD-10-CM

## 2014-02-14 NOTE — Progress Notes (Signed)
Pre visit review using our clinic review tool, if applicable. No additional management support is needed unless otherwise documented below in the visit note. 

## 2014-02-14 NOTE — Progress Notes (Signed)
Pt came in today with complaint of painful RLE. Evaluated. Thought due to tight compression bandage placed yesterday. Lower part of leg wrap removed and then redressed less tight. Pt endorsed improvement in pain. Advised to keep next week's appt for wrap removal.

## 2014-02-17 ENCOUNTER — Ambulatory Visit (HOSPITAL_COMMUNITY): Payer: Medicare Other | Attending: Cardiovascular Disease | Admitting: Radiology

## 2014-02-17 ENCOUNTER — Ambulatory Visit (INDEPENDENT_AMBULATORY_CARE_PROVIDER_SITE_OTHER): Payer: Medicare Other | Admitting: Family Medicine

## 2014-02-17 ENCOUNTER — Encounter: Payer: Self-pay | Admitting: Family Medicine

## 2014-02-17 VITALS — BP 126/58 | HR 85 | Temp 97.9°F | Wt 155.8 lb

## 2014-02-17 DIAGNOSIS — I2510 Atherosclerotic heart disease of native coronary artery without angina pectoris: Secondary | ICD-10-CM | POA: Diagnosis not present

## 2014-02-17 DIAGNOSIS — I824Z1 Acute embolism and thrombosis of unspecified deep veins of right distal lower extremity: Secondary | ICD-10-CM

## 2014-02-17 DIAGNOSIS — E119 Type 2 diabetes mellitus without complications: Secondary | ICD-10-CM | POA: Diagnosis not present

## 2014-02-17 DIAGNOSIS — I83891 Varicose veins of right lower extremities with other complications: Secondary | ICD-10-CM

## 2014-02-17 DIAGNOSIS — I25810 Atherosclerosis of coronary artery bypass graft(s) without angina pectoris: Secondary | ICD-10-CM

## 2014-02-17 DIAGNOSIS — R002 Palpitations: Secondary | ICD-10-CM | POA: Diagnosis not present

## 2014-02-17 DIAGNOSIS — I10 Essential (primary) hypertension: Secondary | ICD-10-CM | POA: Insufficient documentation

## 2014-02-17 DIAGNOSIS — R079 Chest pain, unspecified: Secondary | ICD-10-CM | POA: Insufficient documentation

## 2014-02-17 DIAGNOSIS — E785 Hyperlipidemia, unspecified: Secondary | ICD-10-CM | POA: Diagnosis not present

## 2014-02-17 MED ORDER — CEPHALEXIN 500 MG PO CAPS: 500.0000 mg | ORAL_CAPSULE | Freq: Three times a day (TID) | ORAL | Status: DC

## 2014-02-17 MED ORDER — REGADENOSON 0.4 MG/5ML IV SOLN
0.4000 mg | Freq: Once | INTRAVENOUS | Status: AC
  Administered 2014-02-17: 0.4 mg via INTRAVENOUS

## 2014-02-17 MED ORDER — TECHNETIUM TC 99M SESTAMIBI GENERIC - CARDIOLITE
33.0000 | Freq: Once | INTRAVENOUS | Status: AC | PRN
  Administered 2014-02-17: 33 via INTRAVENOUS

## 2014-02-17 NOTE — Patient Instructions (Signed)
Leg is looking good! Take antibiotic - sent to pharmacy. Return Thursday for follow up.

## 2014-02-17 NOTE — Progress Notes (Signed)
BP 126/58 mmHg  Pulse 85  Temp(Src) 97.9 F (36.6 C) (Oral)  Wt 155 lb 12 oz (70.648 kg)  SpO2 98%   CC: wound check  Subjective:    [name], female    DOB: 1927-04-25, 80 y.o.   MRN: [id number]  HPI: ROSITA BABAUTA is a 80 y.o. female presenting on 02/17/2014 for 4 day follow up   Friday pt had vessel that burst in R lower medial extremity, treated with compression bandage to RLE. Pt had trouble tolerating 2/2 tightness - with pain over weekend - but kept dressing in place. Presents today for recheck.  Relevant past medical, surgical, family and social history reviewed and updated as indicated.  Allergies and medications reviewed and updated. Current Outpatient Prescriptions on File Prior to Visit  Medication Sig  . ALPRAZolam (XANAX) 1 MG tablet Take 0.5 mg by mouth 4 (four) times daily. For anxiety  . Ascorbic Acid (VITAMIN C PO) Take 1,000 mg by mouth daily.   Marland Kitchen aspirin EC 81 MG tablet Take 1 tablet (81 mg total) by mouth every Monday, Wednesday, and Friday. (Patient taking differently: Take 81 mg by mouth daily. )  . CALCIUM PO Take 600 mg by mouth daily.   . Cyanocobalamin (VITAMIN B 12 PO) Take 1 tablet by mouth daily.  Marland Kitchen Dexlansoprazole (DEXILANT) 30 MG capsule TAKE 1 CAPSULE BY MOUTH DAILY for heartburn  . diclofenac sodium (VOLTAREN) 1 % GEL Apply 1 application topically 3 (three) times daily.  Marland Kitchen estradiol (ESTRACE) 0.5 MG tablet Take 0.5 tablets (0.25 mg total) by mouth every other day.  . ferrous sulfate 325 (65 FE) MG tablet Take 1 tablet (325 mg total) by mouth 2 (two) times daily with a meal.  . furosemide (LASIX) 20 MG tablet Take 20 mg by mouth daily. For leg swelling  . gabapentin (NEURONTIN) 100 MG capsule Take 100 mg by mouth at bedtime. For leg pain  . glucosamine-chondroitin 500-400 MG tablet Take 1 tablet by mouth daily.   Marland Kitchen HYDROcodone-acetaminophen (NORCO/VICODIN) 5-325 MG per tablet Take 0.5-1 tablets by mouth every 6 (six) hours as  needed for moderate pain.  . nitroGLYCERIN (NITROSTAT) 0.4 MG SL tablet Place 0.4 mg under the tongue every 5 (five) minutes as needed. For chest pain May take up to 3 doses, if pain persists call 911.  . pioglitazone (ACTOS) 30 MG tablet Take 30 mg by mouth daily. For diabetes  . pravastatin (PRAVACHOL) 20 MG tablet TAKE 1 TABLET BY MOUTH DAILY. for cholesterol  . ramipril (ALTACE) 5 MG capsule TAKE 1 CAPSULE BY MOUTH EVERY DAY for blood pressure  . traZODone (DESYREL) 150 MG tablet Take 150 mg by mouth at bedtime. For sleep  . triamcinolone cream (KENALOG) 0.1 % Apply 1 application topically daily.  Marland Kitchen VITAMIN E PO Take 1 tablet by mouth daily.    No current facility-administered medications on file prior to visit.    Review of Systems Per HPI unless specifically indicated above    Objective:    BP 126/58 mmHg  Pulse 85  Temp(Src) 97.9 F (36.6 C) (Oral)  Wt 155 lb 12 oz (70.648 kg)  SpO2 98%  Physical Exam  Skin:  Bandage removed, no longer bleeding. Some granulation tissue present at medial lower leg. 1+ pitting edema RLE above site of compression bandage. Slight erythema superior to bandage, none at granulation tissue.  Nursing note and vitals reviewed.      Assessment & Plan:   Problem List  Items Addressed This Visit    Bleeding from varicose veins of right lower extremity - Primary    Now off xarelto. Bleeding has resolved. Redressed area with abx ointment, non stick bandage and kerlex and coban Given mild erythema, cover for infection with keflex course. RTC 3 d for recheck - anticipate full resolution by then. Pt agrees with plan.    Acute deep vein thrombosis (DVT) of distal vein of right lower extremity    Completed ~6 weeks of xarelto, discontinued 2/2 ongoing bleeding from venous ulcers. Continues aspirin 81mg  daily. Would consider rpt [place] later this month to monitor thrombus.        Follow up plan: Return in about 3 days (around 02/20/2014) for follow up  visit.

## 2014-02-17 NOTE — Assessment & Plan Note (Signed)
Now off xarelto. Bleeding has resolved. Redressed area with abx ointment, non stick bandage and kerlex and coban Given mild erythema, cover for infection with keflex course. RTC 3 d for recheck - anticipate full resolution by then. Pt agrees with plan.

## 2014-02-17 NOTE — Assessment & Plan Note (Addendum)
Completed ~6 weeks of xarelto, discontinued 2/2 ongoing bleeding from venous ulcers. Continues aspirin 81mg  daily. Would consider rpt [place] later this month to monitor thrombus.

## 2014-02-17 NOTE — Progress Notes (Signed)
[facility] [address] [place], [place] [address] [phone]    Cardiology Nuclear Med Study  [name] is a 80 y.o. female     MRN : [id number]     DOB: [birth year]  Procedure Date: 02/17/2014  Nuclear Med Background Indication for Stress Test:  Evaluation for Ischemia and Follow up CAD History:  CAD-CABG, 03-2011 MPI: EF: 63% Ischemia  DVT Cardiac Risk Factors: Hypertension, Lipids and NIDDM  Symptoms:  Chest Pain and Palpitations   Nuclear Pre-Procedure Caffeine/Decaff Intake:  7:00pm NPO After: 0930 Patient had toast,egg,milk and cereal   Lungs:  clear O2 Sat: 95% on room air. IV 0.9% NS with Angio Cath:  22g  IV Site: R Hand  IV Started by:  Matilde Haymaker, RN  Chest Size (in):  42 Cup Size: DD  Height: 4\' 10"  (1.473 m)  Weight:  156 lb (70.761 kg)  BMI:  Body mass index is 32.61 kg/(m^2). Tech Comments:  n/a    Nuclear Med Study 1 or 2 day study: 2 day  Stress Test Type:  Lexiscan  Reading MD: n/a  Order Authorizing Provider:  Natale Lay   Resting Radionuclide: Technetium 59m Sestamibi  Resting Radionuclide Dose: 33.0 mCi on 02/18/2014 mCi   Stress Radionuclide:  Technetium 88m Sestamibi  Stress Radionuclide Dose: 33.0 mCi on 02/17/2014          Stress Protocol Rest HR: 76 Stress HR: 88  Rest BP: 111/43 Stress BP: 119/54  Exercise Time (min): n/a METS: n/a   Predicted Max HR: 136 bpm % Max HR: 64.71 bpm Rate Pressure Product: 10472   Dose of Adenosine (mg):  n/a Dose of Lexiscan: 0.4 mg  Dose of Atropine (mg): n/a Dose of Dobutamine: n/a mcg/kg/min (at max HR)  Stress Test Technologist: Perrin Maltese, EMT-P  Nuclear Technologist:  Earl Many, CNMT     Rest Procedure:  Myocardial perfusion imaging was performed at rest 45 minutes following the intravenous administration of Technetium 88m Sestamibi. Rest ECG: NSR - Normal EKG  Stress Procedure:  The patient received IV Lexiscan 0.4 mg over 15-seconds.   Technetium 18m Sestamibi injected at 30-seconds. This patient had sob, headache, abdominal cramps, and an uneasy feeling with the Lexiscan injection. Quantitative spect images were obtained after a 45 minute delay. Stress ECG: No significant change from baseline ECG  QPS Raw Data Images:  There is a breast shadow that accounts for the anterior attenuation. Stress Images:  There is decreased uptake in the lateral wall. Rest Images:  There is decreased uptake in the lateral wall. Subtraction (SDS):  No evidence of ischemia. Transient Ischemic Dilatation (Normal <1.22):  0.96 Lung/Heart Ratio (Normal <0.45):  0.38  Quantitative Gated Spect Images QGS EDV:  86 ml QGS ESV:  29 ml  Impression Exercise Capacity:  Lexiscan with no exercise. BP Response:  Normal blood pressure response. Clinical Symptoms:  There is dyspnea. ECG Impression:  No significant ST segment change suggestive of ischemia. Comparison with Prior Nuclear Study: No images to compare  Overall Impression:  Low risk stress nuclear study with a medium sized, moderately intense fixed defect in the basal and mid lateral wall.  In the setting of normal LVF, this most likely represents breast attenuation artifact.  No inducible ischemia is noted..  LV Ejection Fraction: 67%.  LV Wall Motion:  NL LV Function; NL Wall Motion   Signed: Fransico Him, MD [facility] 02/18/2014

## 2014-02-17 NOTE — Progress Notes (Signed)
Pre visit review using our clinic review tool, if applicable. No additional management support is needed unless otherwise documented below in the visit note. 

## 2014-02-18 ENCOUNTER — Ambulatory Visit (HOSPITAL_COMMUNITY): Payer: Medicare Other | Attending: Family Medicine

## 2014-02-18 DIAGNOSIS — R0989 Other specified symptoms and signs involving the circulatory and respiratory systems: Secondary | ICD-10-CM

## 2014-02-18 MED ORDER — TECHNETIUM TC 99M SESTAMIBI GENERIC - CARDIOLITE
30.0000 | Freq: Once | INTRAVENOUS | Status: AC | PRN
  Administered 2014-02-18: 30 via INTRAVENOUS

## 2014-02-19 ENCOUNTER — Telehealth: Payer: Self-pay | Admitting: Cardiovascular Disease

## 2014-02-19 NOTE — Telephone Encounter (Signed)
Patient notified of low risk myoview and verbalized understanding

## 2014-02-19 NOTE — Telephone Encounter (Signed)
New message ° ° ° ° ° ° °Pt returning nurse call  °

## 2014-02-20 ENCOUNTER — Telehealth: Payer: Self-pay | Admitting: *Deleted

## 2014-02-20 ENCOUNTER — Other Ambulatory Visit: Payer: Self-pay | Admitting: *Deleted

## 2014-02-20 ENCOUNTER — Ambulatory Visit (INDEPENDENT_AMBULATORY_CARE_PROVIDER_SITE_OTHER): Payer: Medicare Other | Admitting: Family Medicine

## 2014-02-20 ENCOUNTER — Encounter: Payer: Self-pay | Admitting: Family Medicine

## 2014-02-20 VITALS — BP 118/74 | HR 84 | Temp 98.2°F | Wt 156.5 lb

## 2014-02-20 DIAGNOSIS — D509 Iron deficiency anemia, unspecified: Secondary | ICD-10-CM | POA: Diagnosis not present

## 2014-02-20 DIAGNOSIS — N951 Menopausal and female climacteric states: Secondary | ICD-10-CM | POA: Diagnosis not present

## 2014-02-20 DIAGNOSIS — I824Z1 Acute embolism and thrombosis of unspecified deep veins of right distal lower extremity: Secondary | ICD-10-CM | POA: Diagnosis not present

## 2014-02-20 DIAGNOSIS — I872 Venous insufficiency (chronic) (peripheral): Secondary | ICD-10-CM | POA: Diagnosis not present

## 2014-02-20 DIAGNOSIS — I2510 Atherosclerotic heart disease of native coronary artery without angina pectoris: Secondary | ICD-10-CM | POA: Diagnosis not present

## 2014-02-20 DIAGNOSIS — R600 Localized edema: Secondary | ICD-10-CM

## 2014-02-20 DIAGNOSIS — I83891 Varicose veins of right lower extremities with other complications: Secondary | ICD-10-CM

## 2014-02-20 DIAGNOSIS — R232 Flushing: Secondary | ICD-10-CM

## 2014-02-20 DIAGNOSIS — I83019 Varicose veins of right lower extremity with ulcer of unspecified site: Secondary | ICD-10-CM

## 2014-02-20 DIAGNOSIS — I87311 Chronic venous hypertension (idiopathic) with ulcer of right lower extremity: Secondary | ICD-10-CM | POA: Diagnosis not present

## 2014-02-20 DIAGNOSIS — L97919 Non-pressure chronic ulcer of unspecified part of right lower leg with unspecified severity: Principal | ICD-10-CM

## 2014-02-20 DIAGNOSIS — E1151 Type 2 diabetes mellitus with diabetic peripheral angiopathy without gangrene: Secondary | ICD-10-CM | POA: Insufficient documentation

## 2014-02-20 DIAGNOSIS — I739 Peripheral vascular disease, unspecified: Secondary | ICD-10-CM | POA: Diagnosis not present

## 2014-02-20 LAB — CBC WITH DIFFERENTIAL/PLATELET
BASOS ABS: 0.1 10*3/uL (ref 0.0–0.1)
BASOS PCT: 0.9 % (ref 0.0–3.0)
Eosinophils Absolute: 0.1 10*3/uL (ref 0.0–0.7)
Eosinophils Relative: 1.3 % (ref 0.0–5.0)
HCT: 23.4 % — CL (ref 36.0–46.0)
Hemoglobin: 7.1 g/dL — CL (ref 12.0–15.0)
LYMPHS PCT: 11.3 % — AB (ref 12.0–46.0)
Lymphs Abs: 1.2 10*3/uL (ref 0.7–4.0)
MCHC: 30.3 g/dL (ref 30.0–36.0)
MCV: 79.6 fl (ref 78.0–100.0)
Monocytes Absolute: 0.7 10*3/uL (ref 0.1–1.0)
Monocytes Relative: 6.9 % (ref 3.0–12.0)
NEUTROS PCT: 79.6 % — AB (ref 43.0–77.0)
Neutro Abs: 8.3 10*3/uL — ABNORMAL HIGH (ref 1.4–7.7)
Platelets: 312 10*3/uL (ref 150.0–400.0)
RBC: 2.94 Mil/uL — AB (ref 3.87–5.11)
RDW: 17.4 % — ABNORMAL HIGH (ref 11.5–15.5)
WBC: 10.4 10*3/uL (ref 4.0–10.5)

## 2014-02-20 LAB — RENAL FUNCTION PANEL
Albumin: 3.3 g/dL — ABNORMAL LOW (ref 3.5–5.2)
BUN: 23 mg/dL (ref 6–23)
CHLORIDE: 105 meq/L (ref 96–112)
CO2: 26 mEq/L (ref 19–32)
Calcium: 9 mg/dL (ref 8.4–10.5)
Creatinine, Ser: 0.9 mg/dL (ref 0.4–1.2)
GFR: 61.71 mL/min (ref >60.00)
Glucose, Bld: 126 mg/dL — ABNORMAL HIGH (ref 70–99)
PHOSPHORUS: 2.8 mg/dL (ref 2.3–4.6)
POTASSIUM: 4.6 meq/L (ref 3.5–5.1)
SODIUM: 138 meq/L (ref 135–145)

## 2014-02-20 NOTE — Assessment & Plan Note (Signed)
Continue lasix 20mg  daily. Check renal panel today.

## 2014-02-20 NOTE — Assessment & Plan Note (Addendum)
Continue dressing changes daily. Has appt with [facility]VS tomorrow. Consider unna boot next week. We have struggled last several months with recurrent venous ulcers, pt currently on keflex PO.

## 2014-02-20 NOTE — Assessment & Plan Note (Signed)
She has also struggled with recurrent varicose vein bleeds over the last year - worse these last 6 wks while pt was on xarelto. Now off xarelto and bleed has resolved.  Will await [facility]VS eval tomorrow.

## 2014-02-20 NOTE — Patient Instructions (Addendum)
Start aspirin daily  Stop estrogen. If hot flashes return, may restart at 1/2 tablet every 3 days - our goal is to come off this completely. Continue bandaid and dressing changes daily. Keep appointment with vein doctor tomorrow. Return to see me next week for follow up. Good to see you today, call us with questions. Blood work today.

## 2014-02-20 NOTE — Assessment & Plan Note (Signed)
Recheck CBC. If not improving, will discuss transfusion with patient. Re encouraged daily iron use.

## 2014-02-20 NOTE — Progress Notes (Signed)
Pre visit review using our clinic review tool, if applicable. No additional management support is needed unless otherwise documented below in the visit note. 

## 2014-02-20 NOTE — Telephone Encounter (Signed)
Gill from [facility] lab called in critical lab. HBG 7.1, HCT 23.4. No results avail in pt's chart yet, but will be once all tests are resulted.

## 2014-02-20 NOTE — Assessment & Plan Note (Signed)
H/o this but good pulse RLE today and has tolerated unna boot well in past.

## 2014-02-20 NOTE — Assessment & Plan Note (Addendum)
Acute thrombus of R proximal gastrocnemius vein. Symptomatic provoked distal DVT (12/31/2013) xarelto completed for just under 6 weeks, stopped 2/2 recurrent LE bleed from varicose veins leading to anemia. Consider recheck [place] next month to r/o clot progression.

## 2014-02-20 NOTE — Progress Notes (Signed)
BP 118/74 mmHg  Pulse 84  Temp(Src) 98.2 F (36.8 C) (Oral)  Wt 156 lb 8 oz (70.988 kg)   CC: wound check  Subjective:    [name], female    DOB: 09/24/1927, 80 y.o.   MRN: [id number]  HPI: [name] is a 80 y.o. female presenting on 02/20/2014 for Follow-up   See prior notes for details. Here today for wound check. Last few visits bleeding wound treated with compression bandage and finally stopped bleeding. Some granulation tissue present last visit treated with neosporin ointment and pt was placed on keflex course.  Tolerating keflex well.   Remains very anxious about legs, stay tender and swollen. 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  Allergies and medications reviewed and updated. Current Outpatient Prescriptions on File Prior to Visit  Medication Sig  . ALPRAZolam (XANAX) 1 MG tablet Take 0.5 mg by mouth 4 (four) times daily. For anxiety  . Ascorbic Acid (VITAMIN C PO) Take 1,000 mg by mouth daily.   Marland Kitchen CALCIUM PO Take 600 mg by mouth daily.   . cephALEXin (KEFLEX) 500 MG capsule Take 1 capsule (500 mg total) by mouth 3 (three) times daily. For 5 day course  . Cyanocobalamin (VITAMIN B 12 PO) Take 1 tablet by mouth daily.  Marland Kitchen Dexlansoprazole (DEXILANT) 30 MG capsule TAKE 1 CAPSULE BY MOUTH DAILY for heartburn  . diclofenac sodium (VOLTAREN) 1 % GEL Apply 1 application topically 3 (three) times daily.  Marland Kitchen estradiol (ESTRACE) 0.5 MG tablet Take 0.5 tablets (0.25 mg total) by mouth every other day.  . ferrous sulfate 325 (65 FE) MG tablet Take 1 tablet (325 mg total) by mouth 2 (two) times daily with a meal.  . furosemide (LASIX) 20 MG tablet Take 20 mg by mouth daily. For leg swelling  . gabapentin (NEURONTIN) 100 MG capsule  Take 100 mg by mouth at bedtime. For leg pain  . glucosamine-chondroitin 500-400 MG tablet Take 1 tablet by mouth daily.   Marland Kitchen HYDROcodone-acetaminophen (NORCO/VICODIN) 5-325 MG per tablet Take 0.5-1 tablets by mouth every 6 (six) hours as needed for moderate pain.  . nitroGLYCERIN (NITROSTAT) 0.4 MG SL tablet Place 0.4 mg under the tongue every 5 (five) minutes as needed. For chest pain May take up to 3 doses, if pain persists call 911.  . pioglitazone (ACTOS) 30 MG tablet Take 30 mg by mouth daily. For diabetes  . pravastatin (PRAVACHOL) 20 MG tablet TAKE 1 TABLET BY MOUTH DAILY. for cholesterol  . ramipril (ALTACE) 5 MG capsule TAKE 1 CAPSULE BY MOUTH EVERY DAY for blood pressure  . traZODone (DESYREL) 150 MG tablet Take 150 mg by mouth at bedtime. For sleep  . triamcinolone cream (KENALOG) 0.1 % Apply 1 application topically daily.  Marland Kitchen VITAMIN E PO Take 1 tablet by mouth daily.    No current facility-administered medications on file prior to visit.    Review of Systems Per HPI unless specifically indicated above    Objective:    BP 118/74 mmHg  Pulse 84  Temp(Src) 98.2 F (36.8 C) (Oral)  Wt 156 lb 8 oz (70.988 kg)  Physical Exam  Constitutional: She appears well-developed and well-nourished. No distress.  Elderly caucasian female ambulates with walker  Musculoskeletal: She exhibits edema (  1+ tender pitting).  R medial lower leg ~4x5cm of macerated skin with underlying shallow venous ulcers R upper medial leg with abrasion Both lesions dressed with triple abx ointment then bandaid placed on superior lesion and nonstick gauze placed on lower lesion with kerlex/coban wrap. 2+ RLE DP  Psychiatric: Her mood appears anxious.  Nursing note and vitals reviewed.  Results for orders placed or performed in visit on 02/03/14  CBC with Differential  Result Value Ref Range   WBC 8.4 4.0 - 10.5 K/uL   RBC 2.95 (L) 3.87 - 5.11 Mil/uL   Hemoglobin 7.6 Repeated and verified X2. (LL) 12.0 -  15.0 g/dL   HCT 24.5 (L) 36.0 - 46.0 %   MCV 83.1 78.0 - 100.0 fl   MCHC 31.2 30.0 - 36.0 g/dL   RDW 16.0 (H) 11.5 - 15.5 %   Platelets 341.0 150.0 - 400.0 K/uL   Neutrophils Relative % 72.7 43.0 - 77.0 %   Lymphocytes Relative 15.8 12.0 - 46.0 %   Monocytes Relative 9.5 3.0 - 12.0 %   Eosinophils Relative 1.1 0.0 - 5.0 %   Basophils Relative 0.9 0.0 - 3.0 %   Neutro Abs 6.1 1.4 - 7.7 K/uL   Lymphs Abs 1.3 0.7 - 4.0 K/uL   Monocytes Absolute 0.8 0.1 - 1.0 K/uL   Eosinophils Absolute 0.1 0.0 - 0.7 K/uL   Basophils Absolute 0.1 0.0 - 0.1 K/uL  Ferritin  Result Value Ref Range   Ferritin 6.3 (L) 10.0 - 291.0 ng/mL  IBC panel  Result Value Ref Range   Iron 14 (L) 42 - 145 ug/dL   Transferrin 291.7 212.0 - 360.0 mg/dL   Saturation Ratios 3.4 (L) 20.0 - 50.0 %  Renal function panel  Result Value Ref Range   Sodium 136 135 - 145 mEq/L   Potassium 4.6 3.5 - 5.1 mEq/L   Chloride 103 96 - 112 mEq/L   CO2 24 19 - 32 mEq/L   Calcium 9.2 8.4 - 10.5 mg/dL   Albumin 3.0 (L) 3.5 - 5.2 g/dL   BUN 34 (H) 6 - 23 mg/dL   Creatinine, Ser 1.2 0.4 - 1.2 mg/dL   Glucose, Bld 146 (H) 70 - 99 mg/dL   Phosphorus 3.1 2.3 - 4.6 mg/dL   GFR 47.23 (L) >60.00 mL/min      Assessment & Plan:   Problem List Items Addressed This Visit    Venous ulcer of leg - Primary    Continue dressing changes daily. Has appt with [facility] tomorrow. Consider unna boot next week. We have struggled last several months with recurrent venous ulcers, pt currently on keflex PO.    Pedal edema    Continue lasix 20mg  daily. Check renal panel today.    Relevant Orders      Renal function panel   PAD (peripheral artery disease)    H/o this but good pulse RLE today and has tolerated unna boot well in past.    Relevant Medications      aspirin EC 81 MG tablet   Microcytic anemia    Recheck CBC. If not improving, will discuss transfusion with patient. Re encouraged daily iron use.    Relevant Orders      CBC with  Differential   Hot flashes    Advised she stop estrogen esp now off xarelto. Currently on 1/2 tab QOD dosing.    Chronic venous insufficiency   Relevant Medications      aspirin EC 81 MG tablet  Bleeding from varicose veins of right lower extremity    She has also struggled with recurrent varicose vein bleeds over the last year - worse these last 6 wks while pt was on xarelto. Now off xarelto and bleed has resolved.  Will await [facility] eval tomorrow.    Relevant Medications      aspirin EC 81 MG tablet   Acute deep vein thrombosis (DVT) of distal vein of right lower extremity    Acute thrombus of R proximal gastrocnemius vein. Symptomatic provoked distal DVT (12/31/2013) xarelto completed for just under 6 weeks, stopped 2/2 recurrent LE bleed from varicose veins leading to anemia. Consider recheck [place] next month to r/o clot progression.    Relevant Medications      aspirin EC 81 MG tablet       Follow up plan: Return in about 4 days (around 02/24/2014) for follow up visit.

## 2014-02-20 NOTE — Assessment & Plan Note (Addendum)
Advised she stop estrogen esp now off xarelto. Currently on 1/2 tab QOD dosing.

## 2014-02-21 ENCOUNTER — Ambulatory Visit (HOSPITAL_COMMUNITY)
Admission: RE | Admit: 2014-02-21 | Discharge: 2014-02-21 | Disposition: A | Discharge status: Home / Self Care | Payer: Medicare Other | Source: Ambulatory Visit | Attending: Vascular Surgery | Admitting: Vascular Surgery

## 2014-02-21 DIAGNOSIS — I83012 Varicose veins of right lower extremity with ulcer of calf: Secondary | ICD-10-CM | POA: Diagnosis not present

## 2014-02-21 DIAGNOSIS — R609 Edema, unspecified: Secondary | ICD-10-CM | POA: Insufficient documentation

## 2014-02-21 DIAGNOSIS — L97219 Non-pressure chronic ulcer of right calf with unspecified severity: Secondary | ICD-10-CM

## 2014-02-21 DIAGNOSIS — Z86718 Personal history of other venous thrombosis and embolism: Secondary | ICD-10-CM | POA: Insufficient documentation

## 2014-02-21 NOTE — Telephone Encounter (Signed)
See result note.  

## 2014-02-24 ENCOUNTER — Encounter: Payer: Self-pay | Admitting: Family Medicine

## 2014-02-24 ENCOUNTER — Telehealth: Payer: Self-pay | Admitting: *Deleted

## 2014-02-24 ENCOUNTER — Ambulatory Visit (INDEPENDENT_AMBULATORY_CARE_PROVIDER_SITE_OTHER): Payer: Medicare Other | Admitting: Family Medicine

## 2014-02-24 ENCOUNTER — Encounter: Payer: Self-pay | Admitting: Vascular Surgery

## 2014-02-24 VITALS — BP 120/60 | HR 96 | Temp 98.0°F | Wt 154.5 lb

## 2014-02-24 DIAGNOSIS — L97919 Non-pressure chronic ulcer of unspecified part of right lower leg with unspecified severity: Secondary | ICD-10-CM

## 2014-02-24 DIAGNOSIS — I2510 Atherosclerotic heart disease of native coronary artery without angina pectoris: Secondary | ICD-10-CM

## 2014-02-24 DIAGNOSIS — I87311 Chronic venous hypertension (idiopathic) with ulcer of right lower extremity: Secondary | ICD-10-CM | POA: Diagnosis not present

## 2014-02-24 DIAGNOSIS — N951 Menopausal and female climacteric states: Secondary | ICD-10-CM | POA: Diagnosis not present

## 2014-02-24 DIAGNOSIS — D509 Iron deficiency anemia, unspecified: Secondary | ICD-10-CM

## 2014-02-24 DIAGNOSIS — I83019 Varicose veins of right lower extremity with ulcer of unspecified site: Secondary | ICD-10-CM

## 2014-02-24 DIAGNOSIS — I83891 Varicose veins of right lower extremities with other complications: Secondary | ICD-10-CM | POA: Diagnosis not present

## 2014-02-24 DIAGNOSIS — R232 Flushing: Secondary | ICD-10-CM

## 2014-02-24 MED ORDER — GABAPENTIN 100 MG PO CAPS: 100.0000 mg | ORAL_CAPSULE | Freq: Every day | ORAL | Status: DC

## 2014-02-24 MED ORDER — HYDROCODONE-ACETAMINOPHEN 5-325 MG PO TABS: 0.5000 | ORAL_TABLET | Freq: Four times a day (QID) | ORAL | Status: DC | PRN

## 2014-02-24 MED ORDER — FUROSEMIDE 20 MG PO TABS: 20.0000 mg | ORAL_TABLET | Freq: Every day | ORAL | Status: DC

## 2014-02-24 NOTE — Telephone Encounter (Signed)
Patient is scheduled for transfusion 02/26/14 at 8:00 AM. It will take ~3.5 hours. You wanted her to come in for recheck, but it is your half day and she won't be able to make it before you leave. Do you want her to come in Friday instead? I told her I would call her and let her know.

## 2014-02-24 NOTE — Progress Notes (Signed)
Pre visit review using our clinic review tool, if applicable. No additional management support is needed unless otherwise documented below in the visit note. 

## 2014-02-24 NOTE — Patient Instructions (Signed)
Keep appointment with Dr Kellie Simmering tomorrow for recurrent bleeding from legs. This should do better now you're off xarelto. Let Dr Kellie Simmering know I may place you in unna boot on Wednesday morning pending his evaluation. Return in 2 days for recheck - may cancel appointment if needed.

## 2014-02-24 NOTE — Assessment & Plan Note (Signed)
Will appreciate VVS input

## 2014-02-24 NOTE — Progress Notes (Signed)
BP 120/60 mmHg  Pulse 96  Temp(Src) 98 F (36.7 C) (Oral)  Wt 154 lb 8 oz (70.081 kg)   CC: f/u visit  Subjective:    [name], female    DOB: 22-Sep-1927, 80 y.o.   MRN: [id number]  HPI: [name] is a 80 y.o. female presenting on 02/24/2014 for Follow-up   See prior note for details.  [name] is here today for f/u lower leg venous ulcer.  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   Microcytic anemia - Hgb 9.6 --> 7.6 --> 7.1 over last 2 months.  Pt thinks she had transfusion 18 yrs ago.  Off estrogen and so far no hot flashes!  Relevant past medical, surgical, family and social history reviewed and updated as indicated.  Allergies and medications reviewed and updated. Current Outpatient Prescriptions on File Prior to Visit  Medication Sig  . ALPRAZolam (XANAX) 1 MG tablet Take 0.5 mg by mouth 4 (four) times daily. For anxiety  . Ascorbic Acid (VITAMIN C PO) Take 1,000 mg by mouth daily.   Marland Kitchen aspirin EC 81 MG tablet Take 81 mg by mouth daily.  Marland Kitchen CALCIUM PO Take 600 mg by mouth daily.   . cephALEXin (KEFLEX) 500 MG capsule Take 1 capsule (500 mg total) by mouth 3 (three) times daily. For 5 day course  . Cyanocobalamin (VITAMIN B 12 PO) Take 1 tablet by mouth daily.  Marland Kitchen Dexlansoprazole (DEXILANT) 30 MG capsule TAKE 1 CAPSULE BY MOUTH DAILY for heartburn  . diclofenac sodium (VOLTAREN) 1 % GEL Apply 1 application topically 3 (three) times daily.  . ferrous sulfate 325 (65 FE) MG tablet Take 1 tablet (325 mg total) by mouth 2 (two) times daily with a meal.  . glucosamine-chondroitin 500-400 MG tablet Take 1 tablet by mouth daily.   . nitroGLYCERIN (NITROSTAT) 0.4 MG SL tablet Place 0.4 mg under the tongue every 5 (five) minutes as needed. For chest pain May take up to 3 doses, if pain persists call 911.  . pioglitazone (ACTOS) 30 MG tablet Take 30 mg by  mouth daily. For diabetes  . pravastatin (PRAVACHOL) 20 MG tablet TAKE 1 TABLET BY MOUTH DAILY. for cholesterol  . ramipril (ALTACE) 5 MG capsule TAKE 1 CAPSULE BY MOUTH EVERY DAY for blood pressure  . traZODone (DESYREL) 150 MG tablet Take 150 mg by mouth at bedtime. For sleep  . triamcinolone cream (KENALOG) 0.1 % Apply 1 application topically daily.  Marland Kitchen VITAMIN E PO Take 1 tablet by mouth daily.    No current facility-administered medications on file prior to visit.    Review of Systems Per HPI unless specifically indicated above    Objective:    BP 120/60 mmHg  Pulse 96  Temp(Src) 98 F (36.7 C) (Oral)  Wt 154 lb 8 oz (70.081 kg)  Physical Exam  Constitutional: She appears well-developed and well-nourished. No distress.  Musculoskeletal: She exhibits no edema (much improved).  4x2.5cm erosion R medial leg without significant drainage today, no bleeding. Improved erythema. Area redressed with nonstick bandage and kerlex dressing. 2+ DP on R  Nursing note and vitals reviewed.  Results for orders placed or performed in visit on 02/20/14  Renal function panel  Result Value Ref Range   Sodium 138 135 - 145 mEq/L   Potassium 4.6 3.5 - 5.1 mEq/L   Chloride 105 96 - 112  mEq/L   CO2 26 19 - 32 mEq/L   Calcium 9.0 8.4 - 10.5 mg/dL   Albumin 3.3 (L) 3.5 - 5.2 g/dL   BUN 23 6 - 23 mg/dL   Creatinine, Ser 0.9 0.4 - 1.2 mg/dL   Glucose, Bld 126 (H) 70 - 99 mg/dL   Phosphorus 2.8 2.3 - 4.6 mg/dL   GFR 61.71 >60.00 mL/min  CBC with Differential  Result Value Ref Range   WBC 10.4 4.0 - 10.5 K/uL   RBC 2.94 (L) 3.87 - 5.11 Mil/uL   Hemoglobin 7.1 (LL) 12.0 - 15.0 g/dL   HCT 23.4 (LL) 36.0 - 46.0 %   MCV 79.6 78.0 - 100.0 fl   MCHC 30.3 30.0 - 36.0 g/dL   RDW 17.4 (H) 11.5 - 15.5 %   Platelets 312.0 150.0 - 400.0 K/uL   Neutrophils Relative % 79.6 (H) 43.0 - 77.0 %   Lymphocytes Relative 11.3 (L) 12.0 - 46.0 %   Monocytes Relative 6.9 3.0 - 12.0 %   Eosinophils Relative 1.3 0.0  - 5.0 %   Basophils Relative 0.9 0.0 - 3.0 %   Neutro Abs 8.3 (H) 1.4 - 7.7 K/uL   Lymphs Abs 1.2 0.7 - 4.0 K/uL   Monocytes Absolute 0.7 0.1 - 1.0 K/uL   Eosinophils Absolute 0.1 0.0 - 0.7 K/uL   Basophils Absolute 0.1 0.0 - 0.1 K/uL      Assessment & Plan:   Problem List Items Addressed This Visit    Venous ulcer of leg    Slowly healing, no surrounding erythema. Dressed with abx ointment, nonadherent bandage, and kerlex wrap. Keep appt with [facility] tomorrow. R 2+ DP today. Would consider unna boot placement on Wednesday pending Dr Evelena Leyden evaluation.    Microcytic anemia    Discussed blood transfusion today including risks and benefits of procedure.  Given cardiac history as well as endorsed fatigue, I did recommend she undergo 1u pRBC transfusion and will schedule this. Pt agrees. Will try and schedule over the next week.    Hot flashes    So far stable off estrogen. Continue to monitor off med.    Bleeding from varicose veins of right lower extremity - Primary    Will appreciate [facility] input    Relevant Medications      furosemide (LASIX) tablet       Follow up plan: Return in about 2 days (around 02/26/2014) for follow up visit.

## 2014-02-24 NOTE — Telephone Encounter (Signed)
May schedule with me Wed early afternoon or Friday - depending on tomorrow's visit with v[facility]. Can we call pt tomorrow afternoon and decide then?

## 2014-02-24 NOTE — Assessment & Plan Note (Signed)
So far stable off estrogen. Continue to monitor off med.

## 2014-02-24 NOTE — Assessment & Plan Note (Signed)
Discussed blood transfusion today including risks and benefits of procedure.  Given cardiac history as well as endorsed fatigue, I did recommend she undergo 1u pRBC transfusion and will schedule this. Pt agrees. Will try and schedule over the next week.

## 2014-02-24 NOTE — Assessment & Plan Note (Signed)
Slowly healing, no surrounding erythema. Dressed with abx ointment, nonadherent bandage, and kerlex wrap. Keep appt with [facility] tomorrow. R 2+ DP today. Would consider unna boot placement on Wednesday pending Dr Evelena Leyden evaluation.

## 2014-02-25 ENCOUNTER — Other Ambulatory Visit (HOSPITAL_COMMUNITY): Payer: Self-pay | Admitting: *Deleted

## 2014-02-25 ENCOUNTER — Telehealth: Payer: Self-pay | Admitting: Family Medicine

## 2014-02-25 ENCOUNTER — Ambulatory Visit: Payer: Medicare Other | Admitting: Vascular Surgery

## 2014-02-25 NOTE — Telephone Encounter (Signed)
Then let's have her come in tomorrow for unna boot placement.
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
## 2014-02-25 NOTE — Telephone Encounter (Signed)
Patient notified and appt scheduled.

## 2014-02-26 ENCOUNTER — Ambulatory Visit (HOSPITAL_COMMUNITY)
Admission: RE | Admit: 2014-02-26 | Discharge: 2014-02-26 | Disposition: A | Discharge status: Home / Self Care | Payer: Medicare Other | Source: Ambulatory Visit | Attending: Family Medicine | Admitting: Family Medicine

## 2014-02-26 ENCOUNTER — Ambulatory Visit: Payer: Medicare Other | Admitting: Family Medicine

## 2014-02-26 ENCOUNTER — Ambulatory Visit (INDEPENDENT_AMBULATORY_CARE_PROVIDER_SITE_OTHER): Payer: Medicare Other | Admitting: *Deleted

## 2014-02-26 DIAGNOSIS — I83891 Varicose veins of right lower extremities with other complications: Secondary | ICD-10-CM | POA: Insufficient documentation

## 2014-02-26 DIAGNOSIS — I83019 Varicose veins of right lower extremity with ulcer of unspecified site: Secondary | ICD-10-CM

## 2014-02-26 DIAGNOSIS — I83018 Varicose veins of right lower extremity with ulcer other part of lower leg: Secondary | ICD-10-CM

## 2014-02-26 DIAGNOSIS — I2510 Atherosclerotic heart disease of native coronary artery without angina pectoris: Secondary | ICD-10-CM | POA: Diagnosis not present

## 2014-02-26 DIAGNOSIS — I83014 Varicose veins of right lower extremity with ulcer of heel and midfoot: Secondary | ICD-10-CM

## 2014-02-26 DIAGNOSIS — I83012 Varicose veins of right lower extremity with ulcer of calf: Secondary | ICD-10-CM

## 2014-02-26 DIAGNOSIS — I83013 Varicose veins of right lower extremity with ulcer of ankle: Secondary | ICD-10-CM

## 2014-02-26 DIAGNOSIS — I872 Venous insufficiency (chronic) (peripheral): Secondary | ICD-10-CM

## 2014-02-26 DIAGNOSIS — D509 Iron deficiency anemia, unspecified: Secondary | ICD-10-CM | POA: Insufficient documentation

## 2014-02-26 DIAGNOSIS — L97919 Non-pressure chronic ulcer of unspecified part of right lower leg with unspecified severity: Principal | ICD-10-CM

## 2014-02-26 DIAGNOSIS — I83011 Varicose veins of right lower extremity with ulcer of thigh: Secondary | ICD-10-CM

## 2014-02-26 DIAGNOSIS — I83015 Varicose veins of right lower extremity with ulcer other part of foot: Secondary | ICD-10-CM

## 2014-02-26 LAB — PREPARE RBC (CROSSMATCH)

## 2014-02-26 MED ORDER — SODIUM CHLORIDE 0.9 % IV SOLN
Freq: Once | INTRAVENOUS | Status: AC
  Administered 2014-02-26: 10:00:00 via INTRAVENOUS

## 2014-02-26 NOTE — Progress Notes (Signed)
Noted. Agree. I asked [name] to apply unna boot to foot today to expedite venous ulcer healing as VVS appt was cancelled this week.

## 2014-02-27 LAB — TYPE AND SCREEN
ABO/RH(D): O POS
Antibody Screen: NEGATIVE
UNIT DIVISION: 0

## 2014-03-03 ENCOUNTER — Ambulatory Visit (INDEPENDENT_AMBULATORY_CARE_PROVIDER_SITE_OTHER): Payer: Medicare Other | Admitting: Family Medicine

## 2014-03-03 ENCOUNTER — Encounter: Payer: Self-pay | Admitting: Vascular Surgery

## 2014-03-03 ENCOUNTER — Encounter: Payer: Self-pay | Admitting: Family Medicine

## 2014-03-03 VITALS — BP 124/68 | HR 80 | Temp 98.9°F | Wt 152.5 lb

## 2014-03-03 DIAGNOSIS — I87311 Chronic venous hypertension (idiopathic) with ulcer of right lower extremity: Secondary | ICD-10-CM | POA: Diagnosis not present

## 2014-03-03 DIAGNOSIS — I2510 Atherosclerotic heart disease of native coronary artery without angina pectoris: Secondary | ICD-10-CM

## 2014-03-03 DIAGNOSIS — D509 Iron deficiency anemia, unspecified: Secondary | ICD-10-CM | POA: Diagnosis not present

## 2014-03-03 DIAGNOSIS — I83019 Varicose veins of right lower extremity with ulcer of unspecified site: Secondary | ICD-10-CM

## 2014-03-03 DIAGNOSIS — L97919 Non-pressure chronic ulcer of unspecified part of right lower leg with unspecified severity: Principal | ICD-10-CM

## 2014-03-03 NOTE — Patient Instructions (Signed)
Keep appointment with vein doctor tomorrow. We will re wrap your leg today. Keep changing dressing once daily. Continue iron and aspirin daily. You are doing well today. Return in 2 weeks for follow up.

## 2014-03-03 NOTE — Assessment & Plan Note (Signed)
Looks much better after 1 u pRBC transfusion last week.

## 2014-03-03 NOTE — Progress Notes (Signed)
BP 124/68 mmHg  Pulse 80  Temp(Src) 98.9 F (37.2 C) (Oral)  Wt 152 lb 8 oz (69.174 kg)   CC: f/u visit  Subjective:    [name], female    DOB: Sep 16, 1927, 80 y.o.   MRN: [id number]  HPI: [name] is a 80 y.o. female presenting on 03/03/2014 for Follow-up   Pt had transfusion 1 u pRBC last week. Pt tolerated well. Seems less fatigued today. Afterwards had unna boot placed. However she removed on same day "too tight and painful".  VVS appt had to be rescheduled - to tomorrow.  Taking aspirin 81mg  daily as well as iron bid.  Relevant past medical, surgical, family and social history reviewed and updated as indicated. Interim medical history since our last visit reviewed. Allergies and medications reviewed and updated.  Current Outpatient Prescriptions on File Prior to Visit  Medication Sig  . ALPRAZolam (XANAX) 1 MG tablet Take 0.5 mg by mouth 4 (four) times daily. For anxiety  . Ascorbic Acid (VITAMIN C PO) Take 1,000 mg by mouth daily.   Marland Kitchen aspirin EC 81 MG tablet Take 81 mg by mouth daily.  Marland Kitchen CALCIUM PO Take 600 mg by mouth daily.   . Cyanocobalamin (VITAMIN B 12 PO) Take 1 tablet by mouth daily.  Marland Kitchen Dexlansoprazole (DEXILANT) 30 MG capsule TAKE 1 CAPSULE BY MOUTH DAILY for heartburn  . diclofenac sodium (VOLTAREN) 1 % GEL Apply 1 application topically 3 (three) times daily.  . ferrous sulfate 325 (65 FE) MG tablet Take 1 tablet (325 mg total) by mouth 2 (two) times daily with a meal.  . furosemide (LASIX) 20 MG tablet Take 1 tablet (20 mg total) by mouth daily. For leg swelling  . gabapentin (NEURONTIN) 100 MG capsule Take 1 capsule (100 mg total) by mouth at bedtime. For leg pain  . glucosamine-chondroitin 500-400 MG tablet Take 1 tablet by mouth daily.   Marland Kitchen HYDROcodone-acetaminophen (NORCO/VICODIN) 5-325 MG per tablet Take 0.5-1 tablets by mouth every 6 (six) hours as needed for moderate pain.  . nitroGLYCERIN (NITROSTAT) 0.4 MG SL tablet Place 0.4  mg under the tongue every 5 (five) minutes as needed. For chest pain May take up to 3 doses, if pain persists call 911.  . pioglitazone (ACTOS) 30 MG tablet Take 30 mg by mouth daily. For diabetes  . pravastatin (PRAVACHOL) 20 MG tablet TAKE 1 TABLET BY MOUTH DAILY. for cholesterol  . ramipril (ALTACE) 5 MG capsule TAKE 1 CAPSULE BY MOUTH EVERY DAY for blood pressure  . traZODone (DESYREL) 150 MG tablet Take 150 mg by mouth at bedtime. For sleep  . triamcinolone cream (KENALOG) 0.1 % Apply 1 application topically daily.  Marland Kitchen VITAMIN E PO Take 1 tablet by mouth daily.    No current facility-administered medications on file prior to visit.    Review of Systems Per HPI unless specifically indicated above     Objective:    BP 124/68 mmHg  Pulse 80  Temp(Src) 98.9 F (37.2 C) (Oral)  Wt 152 lb 8 oz (69.174 kg)  Wt Readings from Last 3 Encounters:  03/03/14 152 lb 8 oz (69.174 kg)  02/26/14 154 lb 8 oz (70.081 kg)  02/24/14 154 lb 8 oz (70.081 kg)    Physical Exam  Constitutional: She appears well-developed and well-nourished. No distress.  Musculoskeletal: She exhibits edema (tr bilateral pedal edema).  Mild erythema right medial ankle shallow venous ulcer. 1+ DP on right.  Psychiatric: Her mood appears  anxious.  Nursing note and vitals reviewed.      Assessment & Plan:   Problem List Items Addressed This Visit    Venous ulcer of leg - Primary    Did not tolerate unna boot. Wound slowly healing, no secondary infection currently. Re dressed today with triple abx ointment and nonstick gauze. Appreciate VVS assistance with chronic venous insufficiency and recurrent venous bleeds.    Microcytic anemia    Looks much better after 1 u pRBC transfusion last week.        Follow up plan: Return in about 2 weeks (around 03/17/2014), or as needed, for follow up visit.

## 2014-03-03 NOTE — Progress Notes (Signed)
Pre visit review using our clinic review tool, if applicable. No additional management support is needed unless otherwise documented below in the visit note. 

## 2014-03-03 NOTE — Assessment & Plan Note (Signed)
Did not tolerate unna boot. Wound slowly healing, no secondary infection currently. Re dressed today with triple abx ointment and nonstick gauze. Appreciate VVS assistance with chronic venous insufficiency and recurrent venous bleeds.

## 2014-03-04 ENCOUNTER — Encounter: Payer: BLUE CROSS/BLUE SHIELD | Admitting: Gynecology

## 2014-03-04 ENCOUNTER — Ambulatory Visit (INDEPENDENT_AMBULATORY_CARE_PROVIDER_SITE_OTHER): Payer: Medicare Other | Admitting: Vascular Surgery

## 2014-03-04 ENCOUNTER — Encounter: Payer: Self-pay | Admitting: Vascular Surgery

## 2014-03-04 ENCOUNTER — Ambulatory Visit: Payer: Medicare Other | Admitting: Family Medicine

## 2014-03-04 VITALS — BP 134/64 | HR 82 | Resp 16 | Ht <= 58 in | Wt 151.0 lb

## 2014-03-04 DIAGNOSIS — I2510 Atherosclerotic heart disease of native coronary artery without angina pectoris: Secondary | ICD-10-CM | POA: Diagnosis not present

## 2014-03-04 DIAGNOSIS — I83893 Varicose veins of bilateral lower extremities with other complications: Secondary | ICD-10-CM | POA: Diagnosis not present

## 2014-03-04 HISTORY — PX: ENDOVENOUS ABLATION SAPHENOUS VEIN W/ LASER: SUR449

## 2014-03-04 NOTE — Progress Notes (Signed)
Subjective:     [name], female   DOB: 1927/09/27, 80 y.o.   MRN: [id number]  HPI this 80 year old female returns for further discussion regarding treatment of her venous reflux on the left leg. She currently has an ulceration in the right ankle area. She has a history of venous stasis ulcers in the left ankle and bleeding in the left ankle which occurred recently. I have seen her in the past and recommended laser ablation of the left great saphenous vein. She now returns for treatment. The ulcer on the right side has been treated with compression dressings and ointment. She has refused Unna boot treatments on the right. She does have swelling in both ankles as the day progresses. She has no history of active DVT or thrombophlebitis but has had the bleeding and ulceration as noted. She has aching throbbing and burning discomfort in both legs. The distal saphenous veins have been removed for coronary artery bypass grafting in the past bilaterally.  Past Medical History  Diagnosis Date  . Hypertension   . Coronary artery disease     CABG 1998  . Anxiety   . Diastolic dysfunction   . Hyperlipidemia   . PUD (peptic ulcer disease)   . Microcytic anemia   . Depression   . Diabetes mellitus     type 2, followed by endo.  . Diverticulosis   . Hemorrhoids   . History of heart attack   . Irritable bowel syndrome   . Chronic back pain 03/2012    lumbar DDD with herniation - s/p L S1 transforaminal ESI (10/2012 Dr. Sharlet Salina)  . Varicose veins   . Cystocele   . Hx of echocardiogram     a. Echo 10/13: mild LVH, EF 55-60%, Gr 1 diast dysfn, PASP 32  . Acute deep vein thrombosis (DVT) of distal vein of right lower extremity 12/2013    Acute thrombus of R proximal gastrocnemius vein. Symptomatic provoked distal DVT  . Cellulitis of leg, left 01/16/2014  . PAD (peripheral artery disease) 2013    ABI: R 0.91, L 0.83  . Chronic venous insufficiency     History  Substance Use Topics   . Smoking status: Former Smoker    Quit date: 03/05/1979  . Smokeless tobacco: Never Used  . Alcohol Use: No    Family History  Problem Relation Age of Onset  . Hypertension Mother   . Stroke Mother   . Diabetes Father   . Diabetes Sister   . Diabetes Brother   . Colon cancer Neg Hx     Allergies  Allergen Reactions  . Amoxicillin-Pot Clavulanate Diarrhea  . Metformin And Related Diarrhea    Current outpatient prescriptions: ALPRAZolam (XANAX) 1 MG tablet, Take 0.5 mg by mouth 4 (four) times daily. For anxiety, Disp: , Rfl: ;  Ascorbic Acid (VITAMIN C PO), Take 1,000 mg by mouth daily. , Disp: , Rfl: ;  aspirin EC 81 MG tablet, Take 81 mg by mouth daily., Disp: , Rfl: ;  CALCIUM PO, Take 600 mg by mouth daily. , Disp: , Rfl: ;  Cyanocobalamin (VITAMIN B 12 PO), Take 1 tablet by mouth daily., Disp: , Rfl:  Dexlansoprazole (DEXILANT) 30 MG capsule, TAKE 1 CAPSULE BY MOUTH DAILY for heartburn, Disp: , Rfl: ;  diclofenac sodium (VOLTAREN) 1 % GEL, Apply 1 application topically 3 (three) times daily., Disp: 1 Tube, Rfl: 1;  ferrous sulfate 325 (65 FE) MG tablet, Take 1 tablet (325 mg total) by  mouth 2 (two) times daily with a meal., Disp: , Rfl: 3 furosemide (LASIX) 20 MG tablet, Take 1 tablet (20 mg total) by mouth daily. For leg swelling, Disp: 30 tablet, Rfl: 6;  gabapentin (NEURONTIN) 100 MG capsule, Take 1 capsule (100 mg total) by mouth at bedtime. For leg pain, Disp: 30 capsule, Rfl: 6;  glucosamine-chondroitin 500-400 MG tablet, Take 1 tablet by mouth daily. , Disp: , Rfl:  HYDROcodone-acetaminophen (NORCO/VICODIN) 5-325 MG per tablet, Take 0.5-1 tablets by mouth every 6 (six) hours as needed for moderate pain., Disp: 30 tablet, Rfl: 0;  nitroGLYCERIN (NITROSTAT) 0.4 MG SL tablet, Place 0.4 mg under the tongue every 5 (five) minutes as needed. For chest pain May take up to 3 doses, if pain persists call 911., Disp: , Rfl: ;  pioglitazone (ACTOS) 30 MG tablet, Take 30 mg by mouth daily.  For diabetes, Disp: , Rfl:  pravastatin (PRAVACHOL) 20 MG tablet, TAKE 1 TABLET BY MOUTH DAILY. for cholesterol, Disp: , Rfl: ;  ramipril (ALTACE) 5 MG capsule, TAKE 1 CAPSULE BY MOUTH EVERY DAY for blood pressure, Disp: , Rfl: ;  traZODone (DESYREL) 150 MG tablet, Take 150 mg by mouth at bedtime. For sleep, Disp: , Rfl: ;  triamcinolone cream (KENALOG) 0.1 %, Apply 1 application topically daily., Disp: , Rfl: ;  VITAMIN E PO, Take 1 tablet by mouth daily. , Disp: , Rfl:   BP 134/64 mmHg  Pulse 82  Resp 16  Ht 4\' 10"  (1.473 m)  Wt 151 lb (68.493 kg)  BMI 31.57 kg/m2  Body mass index is 31.57 kg/(m^2).           Review of Systems denies chest pain,   denies orthopnea, hemoptysis. Complains of swelling in both legs. Other systems negative  Physical Exam BP 134/64 mmHg  Pulse 82  Resp 16  Ht 4\' 10"  (1.473 m)  Wt 151 lb (68.493 kg)  BMI 31.57 kg/m2  Gen.-alert and oriented x3 in no apparent distress HEENT normal for age Lungs no rhonchi or wheezing Cardiovascular regular rhythm no murmurs carotid pulses 3+ palpable no bruits audible Abdomen soft nontender no palpable masses Musculoskeletal free of  major deformities Skin clear -no rashes Neurologic normal Lower extremities 3+ femoral and dorsalis pedis pulse palpable on right 3+ femoral and 2+ posterior tibial pulse palpable on left Chronic ulceration proximal to right medial malleolus with hyperpigmentation but no cellulitis or gangrene noted. Bulging varicosities below the knee on the left over the great saphenous system with area of previous ulceration noted proximal to medial malleolus. This is also the site of previous bleeding.  Today I ordered bilateral venous duplex exams are reviewed and interpreted. The left leg has gross reflux in the left great saphenous system supplying the bulging varicosities where the bleeding occurred and where the ulceration was noted and there is no DVT. Right leg has some reflux in the distal  aspect of the anterior accessory branch and some in the small saphenous vein but not in the great saphenous vein in the caliber of the veins is small.       Assessment:     History bilateral venous stasis ulcers and bilateral bleeding with documented gross reflux left great saphenous vein. Ulcer and bleeding has occurred despite medical management and compression    Plan:      patient needs laser ablation left great saphenous vein She needs chronic compression dressings right lower leg with medication or needs to agree to [employer] treatment on  the right Will proceed with recertification to perform laser ablation left great saphenous vein to hopefully prevent further ulceration and bleeding on the left which has previously occurred

## 2014-03-05 ENCOUNTER — Ambulatory Visit: Payer: Medicare Other | Admitting: Family Medicine

## 2014-03-10 ENCOUNTER — Ambulatory Visit (INDEPENDENT_AMBULATORY_CARE_PROVIDER_SITE_OTHER): Payer: Medicare Other | Admitting: Endocrinology

## 2014-03-10 ENCOUNTER — Encounter: Payer: Self-pay | Admitting: Endocrinology

## 2014-03-10 ENCOUNTER — Other Ambulatory Visit (INDEPENDENT_AMBULATORY_CARE_PROVIDER_SITE_OTHER): Payer: Medicare Other

## 2014-03-10 VITALS — BP 130/70 | HR 83 | Temp 98.3°F | Resp 14 | Ht <= 58 in | Wt 149.8 lb

## 2014-03-10 DIAGNOSIS — L97911 Non-pressure chronic ulcer of unspecified part of right lower leg limited to breakdown of skin: Secondary | ICD-10-CM

## 2014-03-10 DIAGNOSIS — E119 Type 2 diabetes mellitus without complications: Secondary | ICD-10-CM

## 2014-03-10 DIAGNOSIS — G2581 Restless legs syndrome: Secondary | ICD-10-CM

## 2014-03-10 DIAGNOSIS — E1149 Type 2 diabetes mellitus with other diabetic neurological complication: Secondary | ICD-10-CM

## 2014-03-10 DIAGNOSIS — I2510 Atherosclerotic heart disease of native coronary artery without angina pectoris: Secondary | ICD-10-CM | POA: Diagnosis not present

## 2014-03-10 LAB — URINALYSIS, ROUTINE W REFLEX MICROSCOPIC
Bilirubin Urine: NEGATIVE
Hgb urine dipstick: NEGATIVE
KETONES UR: NEGATIVE
Nitrite: NEGATIVE
PH: 5 (ref 5.0–8.0)
SPECIFIC GRAVITY, URINE: 1.02 (ref 1.000–1.030)
Total Protein, Urine: NEGATIVE
Urine Glucose: NEGATIVE
Urobilinogen, UA: 0.2 (ref 0.0–1.0)

## 2014-03-10 LAB — BASIC METABOLIC PANEL
BUN: 38 mg/dL — ABNORMAL HIGH (ref 6–23)
CALCIUM: 9.1 mg/dL (ref 8.4–10.5)
CO2: 25 mEq/L (ref 19–32)
CREATININE: 1.1 mg/dL (ref 0.4–1.2)
Chloride: 104 mEq/L (ref 96–112)
GFR: 49.17 mL/min — ABNORMAL LOW (ref >60.00)
Glucose, Bld: 111 mg/dL — ABNORMAL HIGH (ref 70–99)
Potassium: 4.2 mEq/L (ref 3.5–5.1)
Sodium: 137 mEq/L (ref 135–145)

## 2014-03-10 LAB — MICROALBUMIN / CREATININE URINE RATIO
Creatinine,U: 49.9 mg/dL
Microalb Creat Ratio: 0.4 mg/g (ref 0.0–30.0)
Microalb, Ur: 0.2 mg/dL (ref 0.0–1.9)

## 2014-03-10 LAB — HEMOGLOBIN A1C: Hgb A1c MFr Bld: 6.6 % — ABNORMAL HIGH (ref 4.6–6.5)

## 2014-03-10 MED ORDER — ROPINIROLE HCL 1 MG PO TABS: 1.0000 mg | ORAL_TABLET | Freq: Every day | ORAL | Status: DC

## 2014-03-10 MED ORDER — DOXYCYCLINE HYCLATE 100 MG PO CAPS: 100.0000 mg | ORAL_CAPSULE | Freq: Two times a day (BID) | ORAL | Status: DC

## 2014-03-10 NOTE — Progress Notes (Signed)
[name], female   DOB: September 10, 1927, 80 y.o.   MRN: [id number]   Reason for Appointment: Diabetes follow-up   History of Present Illness   Diagnosis: Type 2 DIABETES MELITUS, date of diagnosis: 1991   She has had mild diabetes for several years and has been on Actos only since about 2001 Her A1c has generally been under 7% She was given a trial of metformin because of her difficulty with weight loss and also edema but she felt that it made her blood sugars go up and will not try it again Also she thinks her blood sugars were higher with a trial of Januvia Currently not having any side effects from Actos although the dose has been reduced to 30 mg by PCP in 10/15 because of possible connection with her venous pedal edema Again she is is only checking her sugar in the afternoon randomly and more than 2 hours after her meal Did not bring her monitor for download and not clear how often she is checking that his sugars Usually her A1c tends to be near-normal. Needs another A1c today     Oral hypoglycemic drugs: Actos 30 mg daily        Side effects from medications: None Monitors blood glucose: Once a day.    Glucometer: One Touch.          Blood Glucose readings from recall: 107 recently, highest 126 done in the afternoonabout 3 PM.  Does not remember to do it any other time Hypoglycemia frequency: Never.           Meals: 2  meals per day.  usually compliant with diet         Physical activity:  minimal           Dietician visit: Most recent: Several years ago           Last pneumococcal vaccine: 2005   Wt Readings from Last 3 Encounters:  03/10/14 149 lb 12.8 oz (67.949 kg)  03/04/14 151 lb (68.493 kg)  03/03/14 152 lb 8 oz (69.174 kg)   Lab Results  Component Value Date   HGBA1C 6.6* 11/18/2013   HGBA1C 6.6* 07/22/2013   HGBA1C 7.3* 12/10/2012   Lab Results  Component Value Date   MICROALBUR 0.1 07/22/2013   LDLCALC 61 07/22/2013   CREATININE 0.9 02/20/2014        Medication List       This list is accurate as of: 03/10/14 11:28 AM.  Always use your most recent med list.               ALPRAZolam 1 MG tablet  Commonly known as:  XANAX  Take 0.5 mg by mouth 4 (four) times daily. For anxiety     aspirin EC 81 MG tablet  Take 81 mg by mouth daily.     CALCIUM PO  Take 600 mg by mouth daily.     DEXILANT 30 MG capsule  Generic drug:  Dexlansoprazole  TAKE 1 CAPSULE BY MOUTH DAILY for heartburn     diclofenac sodium 1 % Gel  Commonly known as:  VOLTAREN  Apply 1 application topically 3 (three) times daily.     ferrous sulfate 325 (65 FE) MG tablet  Take 1 tablet (325 mg total) by mouth 2 (two) times daily with a meal.     furosemide 20 MG tablet  Commonly known as:  LASIX  Take 1 tablet (20 mg total) by mouth daily. For leg  swelling     gabapentin 100 MG capsule  Commonly known as:  NEURONTIN  Take 1 capsule (100 mg total) by mouth at bedtime. For leg pain     glucosamine-chondroitin 500-400 MG tablet  Take 1 tablet by mouth daily.     HYDROcodone-acetaminophen 5-325 MG per tablet  Commonly known as:  NORCO/VICODIN  Take 0.5-1 tablets by mouth every 6 (six) hours as needed for moderate pain.     nitroGLYCERIN 0.4 MG SL tablet  Commonly known as:  NITROSTAT  Place 0.4 mg under the tongue every 5 (five) minutes as needed. For chest pain May take up to 3 doses, if pain persists call 911.     pioglitazone 30 MG tablet  Commonly known as:  ACTOS  Take 30 mg by mouth daily. For diabetes     pravastatin 20 MG tablet  Commonly known as:  PRAVACHOL  TAKE 1 TABLET BY MOUTH DAILY. for cholesterol     ramipril 5 MG capsule  Commonly known as:  ALTACE  TAKE 1 CAPSULE BY MOUTH EVERY DAY for blood pressure     traZODone 150 MG tablet  Commonly known as:  DESYREL  Take 150 mg by mouth at bedtime. For sleep     triamcinolone cream 0.1 %  Commonly known as:  KENALOG  Apply 1 application topically daily.     VITAMIN B 12  PO  Take 1 tablet by mouth daily.     VITAMIN C PO  Take 1,000 mg by mouth daily.     VITAMIN E PO  Take 1 tablet by mouth daily.        Allergies:  Allergies  Allergen Reactions  . Amoxicillin-Pot Clavulanate Diarrhea  . Metformin And Related Diarrhea    Past Medical History  Diagnosis Date  . Hypertension   . Coronary artery disease     CABG 1998  . Anxiety   . Diastolic dysfunction   . Hyperlipidemia   . PUD (peptic ulcer disease)   . Microcytic anemia   . Depression   . Diabetes mellitus     type 2, followed by endo.  . Diverticulosis   . Hemorrhoids   . History of heart attack   . Irritable bowel syndrome   . Chronic back pain 03/2012    lumbar DDD with herniation - s/p L S1 transforaminal ESI (10/2012 Dr. Sharlet Salina)  . Varicose veins   . Cystocele   . Hx of echocardiogram     a. Echo 10/13: mild LVH, EF 55-60%, Gr 1 diast dysfn, PASP 32  . Acute deep vein thrombosis (DVT) of distal vein of right lower extremity 12/2013    Acute thrombus of R proximal gastrocnemius vein. Symptomatic provoked distal DVT  . Cellulitis of leg, left 01/16/2014  . PAD (peripheral artery disease) 2013    ABI: R 0.91, L 0.83  . Chronic venous insufficiency     Past Surgical History  Procedure Laterality Date  . Tympanoplasty  1960s    R side  . Coronary artery bypass graft  1998  . Cataract surgery    . Cholecystectomy    . Appendectomy      per pt report  . Colonoscopy  02/2010    extensive diverticulosis throughout, small internal hemorrhoids, rec rpt 5 yrs (Dr. Allyn Kenner)  . Hand surgery    . Lexiscan myoview  03/2011    Small basal inferolateral and anterolateral reversible perfusion defect suggests ischemia.  EF was normal.   old infarct, no  new ischemia  . Toe surgery    . Abdominal hysterectomy  1975    TAH,BSO  . Vault prolapse a & p repair  2009    Dr [facility] hospital  . Dexa scan  09/2011    femur -0.8, forearm 0.6 => normal  . Esi  2014    L S1  transforaminal ESI x2 (Chasnis) without improvement  . Abi  2013    R 0.91, L 0.83    Family History  Problem Relation Age of Onset  . Hypertension Mother   . Stroke Mother   . Diabetes Father   . Diabetes Sister   . Diabetes Brother   . Colon cancer Neg Hx     Social History:  reports that she quit smoking about 35 years ago. She has never used smokeless tobacco. She reports that she does not drink alcohol or use illicit drugs.  Review of Systems:  Leg ulcer: She recently saw her vascular surgeon for venous edema and the procedure has been recommended She is concerned about her right leg swelling and having an ulcer  She still has some pain in her lower legs. She takes hydrocodone with some relief.  However has some burning sensation. Pain is not worse on walking. Not having as much pain with starting gabapentin although she considered this a medication for restless legs  She is asking for a different medication for restless legs which keeps her up at night  HYPERTENSION:  she is having good control usually with ramipril, blood pressure initially high today, usually well controlled with PCP  HYPERLIPIDEMIA: The lipid abnormality consists of elevated LDL treated with pravastatin low-dose.  Lab Results  Component Value Date   CHOL 123 07/22/2013   HDL 44.00 07/22/2013   LDLCALC 61 07/22/2013   TRIG 90.0 07/22/2013   CHOLHDL 3 07/22/2013    Low back pain: intermittent      Examination:   BP 130/70 mmHg  Pulse 83  Temp(Src) 98.3 F (36.8 C)  Resp 14  Ht 4\' 10"  (1.473 m)  Wt 149 lb 12.8 oz (67.949 kg)  BMI 31.32 kg/m2  SpO2 97%  Body mass index is 31.32 kg/(m^2).   She has 1+ right lower pedal edema Has shallow ulcer near the medial lower leg with yellowish discharge and erythema especially inferiorly No edema on the left leg  ASSESSMENT/ PLAN:   Venous ulcer with cellulitis: She will start doxycycline and follow-up with her PCP or vascular surgeon  The  patient's diabetes control appears to be fairly good at home but she did not monitor as much and has not brought her readings for review She has generally been well controlled with Actos alone  Although the dose has been reduced to 30 mg she has not had any bilaterally edema with this medication previously Again has difficulty losing weight Currently not exercising She does need to check blood sugars at various times of the day and she will call if they are unusually high She will bring her monitor for review on the next visit  Restless legs: She can try Requip low-dose at night, to follow-up with PCP   [facility] 03/10/2014, 11:28 AM

## 2014-03-12 ENCOUNTER — Other Ambulatory Visit: Payer: Self-pay | Admitting: *Deleted

## 2014-03-12 DIAGNOSIS — I83892 Varicose veins of left lower extremities with other complications: Secondary | ICD-10-CM

## 2014-03-14 ENCOUNTER — Encounter: Payer: Self-pay | Admitting: Vascular Surgery

## 2014-03-17 ENCOUNTER — Ambulatory Visit (INDEPENDENT_AMBULATORY_CARE_PROVIDER_SITE_OTHER): Payer: Medicare Other | Admitting: Vascular Surgery

## 2014-03-17 ENCOUNTER — Encounter: Payer: Self-pay | Admitting: Vascular Surgery

## 2014-03-17 VITALS — BP 138/74 | HR 77 | Resp 18 | Ht <= 58 in | Wt 149.0 lb

## 2014-03-17 DIAGNOSIS — I83892 Varicose veins of left lower extremities with other complications: Secondary | ICD-10-CM | POA: Diagnosis not present

## 2014-03-17 NOTE — Progress Notes (Signed)
Subjective:     [name], female   DOB: 14-Nov-1927, 80 y.o.   MRN: [id number]  HPI this 80 year old female had laser ablation of the left great saphenous vein from the proximal calf to near the saphenofemoral junction performed under local tumescent anesthesia for gross reflux with a history of bleeding from varicosities. She tolerated the procedure well. A total of 2365 J of energy was utilized. Review of Systems     Objective:   Physical Exam BP 138/74 mmHg  Pulse 77  Resp 18  Ht 4\' 10"  (1.473 m)  Wt 149 lb (67.586 kg)  BMI 31.15 kg/m2      Assessment:     Well-tolerated laser ablation left great saphenous vein performed under local tumescent anesthesia    Plan:     Return in one week for venous duplex exam to confirm closure left great saphenous vein. Patient will then return to wound center for treatment of right leg ulcer

## 2014-03-17 NOTE — Progress Notes (Signed)
   Laser Ablation Procedure      Date: 03/17/2014    [name] DOB:06-Mar-1928  Consent signed: Yes  Surgeon:J.D. Kellie Simmering  Procedure: Laser Ablation: left Greater Saphenous Vein  BP 138/74 mmHg  Pulse 77  Resp 18  Ht 4\' 10"  (1.473 m)  Wt 149 lb (67.586 kg)  BMI 31.15 kg/m2  Start time: 9am   End time: 10am  Tumescent Anesthesia: 200 cc 0.9% NaCl with 50 cc Lidocaine HCL with 1% Epi and 15 cc 8.4% NaHCO3  Local Anesthesia: 4 cc Lidocaine HCL and NaHCO3 (ratio 2:1)  Pulsed mode: 15 watts, 581ms delay, 1.0 duration Total energy: 2365, total pulses: 159, total time: 2:37     Patient tolerated procedure well: Yes  Notes:   Description of Procedure:  After marking the course of the secondary varicosities, the patient was placed on the operating table in the supine position, and the left leg was prepped and draped in sterile fashion.   Local anesthetic was administered and under ultrasound guidance the saphenous vein was accessed with a micro needle and guide wire; then the mirco puncture sheath was place.  A guide wire was inserted saphenofemoral junction , followed by a 5 french sheath.  The position of the sheath and then the laser fiber below the junction was confirmed using the ultrasound.  Tumescent anesthesia was administered along the course of the saphenous vein using ultrasound guidance. The patient was placed in Trendelenburg position and protective laser glasses were placed on patient and staff, and the laser was fired at 15 watt pulsed mode advancing 1-2 mm per sec for a total of 2365 joules.     Steri strips were applied to the stab wounds and ABD pads and thigh high compression stockings were applied.  Ace wrap bandages were applied over the phlebectomy sites and at the top of the saphenofemoral junction. Blood loss was less than 15 cc.  The patient ambulated out of the operating room having tolerated the procedure well.

## 2014-03-17 NOTE — Progress Notes (Deleted)
   Patient Instructions Following Sclerotherapy  Now that your first treatment is complete, we recommend that you wear compression stockings for 48 hours following the procedure.  Then we suggest you wear them daytime only for the next 2 weeks.  Bruising may occur so do not be alarmed; this is a normal process.  The bruising will fade according to your individual healing process.  You may experience slight discomfort such as aching or throbbing for the couple of days after your initial treatment.  Walking will help alleviate this sensation.  If discomfort continues, an over-the-counter medication such as Ibuprofen may be taken with food to relieve the discomfort.  Ice, rather that heat, initially can ease any pain.  Also, the vessels may appear to turn dark in color and/or be slightly tender.  This could be an entrapment of blood in the closed vessel, which is a normal response to the procedure.  This blood pool will be evacuated at the time of the next treatment session if it continues to persist.  You may apply cream or lotion to your legs after 48 hours.  Shaving should be postponed until then also.  Because heat increases circulation, avoid hot tubs, saunas and long, hot baths for two weeks following treatment.  Exercise is an important part of the healing process.  Walking is fine but avoid leg exercises with weights, high-impact aerobics, biking and running.  These activities should be avoided for at least 2-3 weeks.  Any upper body or abdominal workouts are fine.  Sun exposure is NOT recommended for 2-3 weeks after treatment.  Sun burning areas that have recently been treated could result in skin damage.  It is important to remember that it has take years for these veins to develop and they will not disappear overnight.  It may take several weeks to several months to see your final result.  Please be patient and allow time for healing to occur.  Please call the [place] office at  [phone] with any questions or concerns that you may have about your treatment or recovery.  Office hours are 9:00-5:00 Monday-Friday.. If you have after hour emergencies, the physician on call can be reached through the answering service at [phone] or you go to the nearest emergency room.

## 2014-03-18 ENCOUNTER — Encounter: Payer: Self-pay | Admitting: Family Medicine

## 2014-03-18 ENCOUNTER — Telehealth: Payer: Self-pay | Admitting: *Deleted

## 2014-03-18 ENCOUNTER — Ambulatory Visit (INDEPENDENT_AMBULATORY_CARE_PROVIDER_SITE_OTHER): Payer: Medicare Other | Admitting: Family Medicine

## 2014-03-18 VITALS — BP 126/78 | HR 82 | Temp 98.2°F | Wt 152.8 lb

## 2014-03-18 DIAGNOSIS — I2510 Atherosclerotic heart disease of native coronary artery without angina pectoris: Secondary | ICD-10-CM

## 2014-03-18 DIAGNOSIS — I83019 Varicose veins of right lower extremity with ulcer of unspecified site: Secondary | ICD-10-CM

## 2014-03-18 DIAGNOSIS — I87311 Chronic venous hypertension (idiopathic) with ulcer of right lower extremity: Secondary | ICD-10-CM | POA: Diagnosis not present

## 2014-03-18 DIAGNOSIS — L97919 Non-pressure chronic ulcer of unspecified part of right lower leg with unspecified severity: Principal | ICD-10-CM

## 2014-03-18 MED ORDER — HYDROCODONE-ACETAMINOPHEN 5-325 MG PO TABS: 0.5000 | ORAL_TABLET | Freq: Four times a day (QID) | ORAL | Status: DC | PRN

## 2014-03-18 NOTE — Patient Instructions (Addendum)
Vaseline to R ankle wound with bandage change daily to every other day.  Let me know if not continuing to heal well.  Push fluids and rest. Keep appointment with vein doctor.  Recheck in 3 weeks. I think you are doing well.

## 2014-03-18 NOTE — Telephone Encounter (Signed)
Reached the patient's daughter in law. Pt did not answer her phone. Asked to have her call if she has any questions or concerns. Relative said it was her understanding that she was doing well.

## 2014-03-18 NOTE — Progress Notes (Signed)
Pre visit review using our clinic review tool, if applicable. No additional management support is needed unless otherwise documented below in the visit note. 

## 2014-03-18 NOTE — Assessment & Plan Note (Signed)
No secondary infection. Continues healing slowly. Did not tolerate unna boot previously. rec vaseline dressing changes once daily to every other day.

## 2014-03-18 NOTE — Progress Notes (Signed)
BP 126/78 mmHg  Pulse 82  Temp(Src) 98.2 F (36.8 C) (Oral)  Wt 152 lb 12 oz (69.287 kg)   CC: 2wk f/u   Subjective:    [name], female    DOB: 1927/11/15, 81 y.o.   MRN: [id number]  HPI: [name] is a 80 y.o. female presenting on 03/18/2014 for Follow-up and Diarrhea   Yesterday had laser ablation of L great saphenous vein under local anesthesia by Dr Kellie Simmering. Appreciate excellent care of [facility]. Planned venous duplex exam in 1 week. Ibuprofen post ablation caused diarrhea.  R venous ulcer with cellulitis- started on doxy by endo 1 wk ago. Finishing course today.   RLS - trial requip per endo, did not try this medication. Currently sleeping well.  Relevant past medical, surgical, family and social history reviewed and updated as indicated. Interim medical history since our last visit reviewed. Allergies and medications reviewed and updated.  Current Outpatient Prescriptions on File Prior to Visit  Medication Sig  . ALPRAZolam (XANAX) 1 MG tablet Take 0.5 mg by mouth 4 (four) times daily. For anxiety  . Ascorbic Acid (VITAMIN C PO) Take 1,000 mg by mouth daily.   Marland Kitchen aspirin EC 81 MG tablet Take 81 mg by mouth daily.  Marland Kitchen CALCIUM PO Take 600 mg by mouth daily.   . Cyanocobalamin (VITAMIN B 12 PO) Take 1 tablet by mouth daily.  Marland Kitchen Dexlansoprazole (DEXILANT) 30 MG capsule TAKE 1 CAPSULE BY MOUTH DAILY for heartburn  . diclofenac sodium (VOLTAREN) 1 % GEL Apply 1 application topically 3 (three) times daily.  Marland Kitchen doxycycline (VIBRAMYCIN) 100 MG capsule Take 1 capsule (100 mg total) by mouth 2 (two) times daily.  . ferrous sulfate 325 (65 FE) MG tablet Take 1 tablet (325 mg total) by mouth 2 (two) times daily with a meal.  . furosemide (LASIX) 20 MG tablet Take 1 tablet (20 mg total) by mouth daily. For leg swelling  . gabapentin (NEURONTIN) 100 MG capsule Take 1 capsule (100 mg total) by mouth at bedtime. For leg pain  . glucosamine-chondroitin 500-400 MG tablet  Take 1 tablet by mouth daily.   . nitroGLYCERIN (NITROSTAT) 0.4 MG SL tablet Place 0.4 mg under the tongue every 5 (five) minutes as needed. For chest pain May take up to 3 doses, if pain persists call 911.  . pioglitazone (ACTOS) 30 MG tablet Take 30 mg by mouth daily. For diabetes  . pravastatin (PRAVACHOL) 20 MG tablet TAKE 1 TABLET BY MOUTH DAILY. for cholesterol  . ramipril (ALTACE) 5 MG capsule TAKE 1 CAPSULE BY MOUTH EVERY DAY for blood pressure  . traZODone (DESYREL) 150 MG tablet Take 150 mg by mouth at bedtime. For sleep  . triamcinolone cream (KENALOG) 0.1 % Apply 1 application topically daily.  Marland Kitchen VITAMIN E PO Take 1 tablet by mouth daily.    No current facility-administered medications on file prior to visit.    Review of Systems Per HPI unless specifically indicated above     Objective:    BP 126/78 mmHg  Pulse 82  Temp(Src) 98.2 F (36.8 C) (Oral)  Wt 152 lb 12 oz (69.287 kg)  Wt Readings from Last 3 Encounters:  03/18/14 152 lb 12 oz (69.287 kg)  03/17/14 149 lb (67.586 kg)  03/10/14 149 lb 12.8 oz (67.949 kg)    Physical Exam  Constitutional: She appears well-developed and well-nourished. No distress.  Musculoskeletal: She exhibits edema.  R leg bandaged post ablation L leg with  1+ pedal edema, 1+ DP on left Shallow venous ulcer left medial ankle area 3x3cm without erythema.  Nursing note and vitals reviewed.      Assessment & Plan:   Problem List Items Addressed This Visit    Venous ulcer of leg - Primary    No secondary infection. Continues healing slowly. Did not tolerate unna boot previously. rec vaseline dressing changes once daily to every other day.        Follow up plan: Return in about 3 weeks (around 04/08/2014), or if symptoms worsen or fail to improve.

## 2014-03-21 ENCOUNTER — Encounter: Payer: Self-pay | Admitting: Vascular Surgery

## 2014-03-21 ENCOUNTER — Telehealth: Payer: Self-pay | Admitting: *Deleted

## 2014-03-21 NOTE — Telephone Encounter (Signed)
Patient called complaining of "soreness" in left leg.  She states that she has been unable to take the ibuprofen due to stomach upset and she has removed her compression stocking.  I instructed her to have her daughter assist her with putting the compression stocking back on and apply ice to the area of concern. She states that she will try taking #1 ibuprofen three times a day. Patient's follow up appointment is 03/24/14.  Patient is aware of her appointment and the instructions given.

## 2014-03-24 ENCOUNTER — Ambulatory Visit (INDEPENDENT_AMBULATORY_CARE_PROVIDER_SITE_OTHER): Payer: Medicare Other | Admitting: Vascular Surgery

## 2014-03-24 ENCOUNTER — Encounter: Payer: Self-pay | Admitting: Vascular Surgery

## 2014-03-24 ENCOUNTER — Ambulatory Visit (HOSPITAL_COMMUNITY)
Admission: RE | Admit: 2014-03-24 | Discharge: 2014-03-24 | Disposition: A | Discharge status: Home / Self Care | Payer: Medicare Other | Source: Ambulatory Visit | Attending: Vascular Surgery | Admitting: Vascular Surgery

## 2014-03-24 VITALS — BP 139/64 | HR 78 | Resp 14 | Ht <= 58 in | Wt 149.0 lb

## 2014-03-24 DIAGNOSIS — I2510 Atherosclerotic heart disease of native coronary artery without angina pectoris: Secondary | ICD-10-CM | POA: Diagnosis not present

## 2014-03-24 DIAGNOSIS — I83892 Varicose veins of left lower extremities with other complications: Secondary | ICD-10-CM | POA: Diagnosis not present

## 2014-03-24 NOTE — Progress Notes (Signed)
Subjective:     [name], female   DOB: 1927-07-03, 80 y.o.   MRN: [id number]  HPI this 80 year old female returns 1 week post laser ablation left great saphenous vein from the mid calf to near the saphenofemoral junction for gross reflux and a history of a stasis ulcer. She is having a moderate amount of discomfort in the thigh and medial knee area where the ablation was performed. She was unable to take ibuprofen because of it affecting her GI tract. She does take hydrocodone on a regular basis as well as Aleve when necessary she has been wearing her elastic compression stocking as instructed although it is difficult to get on she states. She continues to have an ulceration in the right leg and is considering going to the wound center for that. There is no procedure we can perform on the right leg that will be helpful.  Past Medical History  Diagnosis Date  . Hypertension   . Coronary artery disease     CABG 1998  . Anxiety   . Diastolic dysfunction   . Hyperlipidemia   . PUD (peptic ulcer disease)   . Microcytic anemia   . Depression   . Diabetes mellitus     type 2, followed by endo.  . Diverticulosis   . Hemorrhoids   . History of heart attack   . Irritable bowel syndrome   . Chronic back pain 03/2012    lumbar DDD with herniation - s/p L S1 transforaminal ESI (10/2012 Dr. Sharlet Salina)  . Varicose veins   . Cystocele   . Hx of echocardiogram     a. Echo 10/13: mild LVH, EF 55-60%, Gr 1 diast dysfn, PASP 32  . Acute deep vein thrombosis (DVT) of distal vein of right lower extremity 12/2013    Acute thrombus of R proximal gastrocnemius vein. Symptomatic provoked distal DVT  . Cellulitis of leg, left 01/16/2014  . PAD (peripheral artery disease) 2013    ABI: R 0.91, L 0.83  . Chronic venous insufficiency     History  Substance Use Topics  . Smoking status: Former Smoker    Quit date: 03/05/1979  . Smokeless tobacco: Never Used  . Alcohol Use: No    Family  History  Problem Relation Age of Onset  . Hypertension Mother   . Stroke Mother   . Diabetes Father   . Diabetes Sister   . Diabetes Brother   . Colon cancer Neg Hx     Allergies  Allergen Reactions  . Amoxicillin-Pot Clavulanate Diarrhea  . Metformin And Related Diarrhea    Current outpatient prescriptions: ALPRAZolam (XANAX) 1 MG tablet, Take 0.5 mg by mouth 4 (four) times daily. For anxiety, Disp: , Rfl: ;  Ascorbic Acid (VITAMIN C PO), Take 1,000 mg by mouth daily. , Disp: , Rfl: ;  aspirin EC 81 MG tablet, Take 81 mg by mouth daily., Disp: , Rfl: ;  CALCIUM PO, Take 600 mg by mouth daily. , Disp: , Rfl: ;  Cyanocobalamin (VITAMIN B 12 PO), Take 1 tablet by mouth daily., Disp: , Rfl:  Dexlansoprazole (DEXILANT) 30 MG capsule, TAKE 1 CAPSULE BY MOUTH DAILY for heartburn, Disp: , Rfl: ;  diclofenac sodium (VOLTAREN) 1 % GEL, Apply 1 application topically 3 (three) times daily., Disp: 1 Tube, Rfl: 1;  doxycycline (VIBRAMYCIN) 100 MG capsule, Take 1 capsule (100 mg total) by mouth 2 (two) times daily., Disp: 14 capsule, Rfl: 0 ferrous sulfate 325 (65 FE) MG tablet,  Take 1 tablet (325 mg total) by mouth 2 (two) times daily with a meal., Disp: , Rfl: 3;  furosemide (LASIX) 20 MG tablet, Take 1 tablet (20 mg total) by mouth daily. For leg swelling, Disp: 30 tablet, Rfl: 6;  gabapentin (NEURONTIN) 100 MG capsule, Take 1 capsule (100 mg total) by mouth at bedtime. For leg pain, Disp: 30 capsule, Rfl: 6 glucosamine-chondroitin 500-400 MG tablet, Take 1 tablet by mouth daily. , Disp: , Rfl: ;  HYDROcodone-acetaminophen (NORCO/VICODIN) 5-325 MG per tablet, Take 0.5-1 tablets by mouth every 6 (six) hours as needed for moderate pain., Disp: 60 tablet, Rfl: 0;  loperamide (IMODIUM A-D) 2 MG tablet, Take 2 mg by mouth 4 (four) times daily as needed for diarrhea or loose stools., Disp: , Rfl:  nitroGLYCERIN (NITROSTAT) 0.4 MG SL tablet, Place 0.4 mg under the tongue every 5 (five) minutes as needed. For chest  pain May take up to 3 doses, if pain persists call 911., Disp: , Rfl: ;  pioglitazone (ACTOS) 30 MG tablet, Take 30 mg by mouth daily. For diabetes, Disp: , Rfl: ;  pravastatin (PRAVACHOL) 20 MG tablet, TAKE 1 TABLET BY MOUTH DAILY. for cholesterol, Disp: , Rfl:  ramipril (ALTACE) 5 MG capsule, TAKE 1 CAPSULE BY MOUTH EVERY DAY for blood pressure, Disp: , Rfl: ;  traZODone (DESYREL) 150 MG tablet, Take 150 mg by mouth at bedtime. For sleep, Disp: , Rfl: ;  triamcinolone cream (KENALOG) 0.1 %, Apply 1 application topically daily., Disp: , Rfl: ;  VITAMIN E PO, Take 1 tablet by mouth daily. , Disp: , Rfl:   BP 139/64 mmHg  Pulse 78  Resp 14  Ht 4\' 10"  (1.473 m)  Wt 149 lb (67.586 kg)  BMI 31.15 kg/m2  Body mass index is 31.15 kg/(m^2).           Review of Systems denies chest pain, dyspnea on exertion, PND, orthopnea, hemoptysis.     Objective:   Physical Exam BP 139/64 mmHg  Pulse 78  Resp 14  Ht 4\' 10"  (1.473 m)  Wt 149 lb (67.586 kg)  BMI 31.15 kg/m2  Gen. well-developed well-nourished female no apparent distress alert and oriented 3 Lungs no rhonchi or wheezing Left leg with moderate discomfort along course of great saphenous vein from proximal calf to proximal thigh. 1+ distal edema. 2+ dorsalis pedis pulse palpable.  Today I ordered a venous duplex exam of the left leg which I reviewed and interpreted. There is no DVT. There is successful ablation of the left great saphenous vein.     Assessment:     Successful laser ablation left great saphenous vein for gross reflux with history of venous stasis ulcer left leg    Plan:     Patient will return to treatment at wound center to help heel ulcer right leg. No interventional options for right leg. Patient to return to see [place] on when necessary basis

## 2014-03-25 ENCOUNTER — Ambulatory Visit (INDEPENDENT_AMBULATORY_CARE_PROVIDER_SITE_OTHER): Payer: Medicare Other | Admitting: Family Medicine

## 2014-03-25 ENCOUNTER — Encounter: Payer: Self-pay | Admitting: Family Medicine

## 2014-03-25 VITALS — BP 126/64 | HR 81 | Temp 98.3°F | Wt 154.0 lb

## 2014-03-25 DIAGNOSIS — I872 Venous insufficiency (chronic) (peripheral): Secondary | ICD-10-CM | POA: Diagnosis not present

## 2014-03-25 DIAGNOSIS — I2510 Atherosclerotic heart disease of native coronary artery without angina pectoris: Secondary | ICD-10-CM | POA: Diagnosis not present

## 2014-03-25 NOTE — Progress Notes (Signed)
Pre visit review using our clinic review tool, if applicable. No additional management support is needed unless otherwise documented below in the visit note.  F/u for R medial ankle lesion.  Has been dressing it at home.  No fevers.  Still with some edema.  Didn't tolerate unna boot prev.  No other new sx.  No pus draining.   Meds, vitals, and allergies reviewed.   ROS: See HPI.  Otherwise, noncontributory.  nad L foot with 1.5x1.5 central shallow ulceration, very shallow lesion with granulation tissue noted.  No purulent discharge, not ttp.  Some peripheral pink skin but doesn't look to have cellulitis.  CP pulse intact. 1+ ankle edema Redressed with neosporin and nonstick bandage.

## 2014-03-25 NOTE — Patient Instructions (Signed)
The ulcer looks better, keep dressing it daily.  If you notice more pain or pus draining, then notify us.  Call back about having Dr. Darnell Level recheck it next week. Take care.

## 2014-03-26 NOTE — Assessment & Plan Note (Signed)
Lesion improved based on prev notes.  Not painful.  Doing well.  Continue elevation and simple dressing. F/u with PCP next week.  No sign of infection, done with oral abx, didn't extend oral abx rx today.  She agrees.

## 2014-04-01 ENCOUNTER — Other Ambulatory Visit (HOSPITAL_COMMUNITY): Payer: Self-pay | Admitting: Cardiology

## 2014-04-01 ENCOUNTER — Ambulatory Visit (INDEPENDENT_AMBULATORY_CARE_PROVIDER_SITE_OTHER): Payer: Medicare Other | Admitting: Family Medicine

## 2014-04-01 ENCOUNTER — Encounter: Payer: Self-pay | Admitting: Family Medicine

## 2014-04-01 VITALS — BP 140/86 | HR 80 | Temp 98.0°F | Wt 152.2 lb

## 2014-04-01 DIAGNOSIS — I87311 Chronic venous hypertension (idiopathic) with ulcer of right lower extremity: Secondary | ICD-10-CM

## 2014-04-01 DIAGNOSIS — I824Z1 Acute embolism and thrombosis of unspecified deep veins of right distal lower extremity: Secondary | ICD-10-CM

## 2014-04-01 DIAGNOSIS — R600 Localized edema: Secondary | ICD-10-CM | POA: Diagnosis not present

## 2014-04-01 DIAGNOSIS — L97919 Non-pressure chronic ulcer of unspecified part of right lower leg with unspecified severity: Secondary | ICD-10-CM

## 2014-04-01 DIAGNOSIS — I2510 Atherosclerotic heart disease of native coronary artery without angina pectoris: Secondary | ICD-10-CM

## 2014-04-01 DIAGNOSIS — I83019 Varicose veins of right lower extremity with ulcer of unspecified site: Secondary | ICD-10-CM

## 2014-04-01 NOTE — Progress Notes (Signed)
Pre visit review using our clinic review tool, if applicable. No additional management support is needed unless otherwise documented below in the visit note. 

## 2014-04-01 NOTE — Progress Notes (Signed)
BP 140/86 mmHg  Pulse 80  Temp(Src) 98 F (36.7 C) (Oral)  Wt 152 lb 4 oz (69.06 kg)   CC: f/u visit  Subjective:    [name], female    DOB: 1927/06/03, 80 y.o.   MRN: [id number]  HPI: [name] is a 80 y.o. female presenting on 04/01/2014 for Edema   R venous ulcer with cellulitis - treated with doxy 7d course at beginning of month and cellulitis improved. Treated last month with keflex course after medial RLE vessel burst and she had recurrent bleed of leg (this was while on xarelto). Ulcer slowly healing. Now notes worsening erythema warmth and swelling of R leg from ankle to knee. L leg not swollen or erythematous.   Staying sore at site of L great saphenous ablation earlier this month, released from [facility] care.  Now completely off estrogen. She did have symptomatic distal RLE DVT (R proximal gastrocnemius vein) and was on xarelto for 6 wks, complicated by recurrent RLE bleeding from burst vessels, so xarelto was discontinued early. S/p 1u pRBG transfusion late 02/2014 and currently compliant with iron bid, aspirin daily.   Relevant past medical, surgical, family and social history reviewed and updated as indicated. Interim medical history since our last visit reviewed. Allergies and medications reviewed and updated.  Current Outpatient Prescriptions on File Prior to Visit  Medication Sig  . ALPRAZolam (XANAX) 1 MG tablet Take 0.5 mg by mouth 4 (four) times daily. For anxiety  . Ascorbic Acid (VITAMIN C PO) Take 1,000 mg by mouth daily.   Marland Kitchen aspirin EC 81 MG tablet Take 81 mg by mouth daily.  Marland Kitchen CALCIUM PO Take 600 mg by mouth daily.   . Cyanocobalamin (VITAMIN B 12 PO) Take 1 tablet by mouth daily.  Marland Kitchen Dexlansoprazole (DEXILANT) 30 MG capsule TAKE 1 CAPSULE BY MOUTH DAILY for heartburn  . diclofenac sodium (VOLTAREN) 1 % GEL Apply 1 application topically 3 (three) times daily.  . ferrous sulfate 325 (65 FE) MG tablet Take 1 tablet (325 mg total) by  mouth 2 (two) times daily with a meal.  . furosemide (LASIX) 20 MG tablet Take 1 tablet (20 mg total) by mouth daily. For leg swelling  . gabapentin (NEURONTIN) 100 MG capsule Take 1 capsule (100 mg total) by mouth at bedtime. For leg pain  . glucosamine-chondroitin 500-400 MG tablet Take 1 tablet by mouth daily.   Marland Kitchen HYDROcodone-acetaminophen (NORCO/VICODIN) 5-325 MG per tablet Take 0.5-1 tablets by mouth every 6 (six) hours as needed for moderate pain.  Marland Kitchen loperamide (IMODIUM A-D) 2 MG tablet Take 2 mg by mouth 4 (four) times daily as needed for diarrhea or loose stools.  . nitroGLYCERIN (NITROSTAT) 0.4 MG SL tablet Place 0.4 mg under the tongue every 5 (five) minutes as needed. For chest pain May take up to 3 doses, if pain persists call 911.  . pioglitazone (ACTOS) 30 MG tablet Take 30 mg by mouth daily. For diabetes  . pravastatin (PRAVACHOL) 20 MG tablet TAKE 1 TABLET BY MOUTH DAILY. for cholesterol  . ramipril (ALTACE) 5 MG capsule TAKE 1 CAPSULE BY MOUTH EVERY DAY for blood pressure  . traZODone (DESYREL) 150 MG tablet Take 150 mg by mouth at bedtime. For sleep  . triamcinolone cream (KENALOG) 0.1 % Apply 1 application topically daily.  Marland Kitchen VITAMIN E PO Take 1 tablet by mouth daily.    No current facility-administered medications on file prior to visit.   Past Medical History  Diagnosis Date  . Hypertension   . Coronary artery disease     CABG 1998  . Anxiety   . Diastolic dysfunction   . Hyperlipidemia   . PUD (peptic ulcer disease)   . Microcytic anemia   . Depression   . Diabetes mellitus     type 2, followed by endo.  . Diverticulosis   . Hemorrhoids   . History of heart attack   . Irritable bowel syndrome   . Chronic back pain 03/2012    lumbar DDD with herniation - s/p L S1 transforaminal ESI (10/2012 Dr. Sharlet Salina)  . Varicose veins   . Cystocele   . Hx of echocardiogram     a. Echo 10/13: mild LVH, EF 55-60%, Gr 1 diast dysfn, PASP 32  . Acute deep vein thrombosis  (DVT) of distal vein of right lower extremity 12/2013    Acute thrombus of R proximal gastrocnemius vein. Symptomatic provoked distal DVT  . Cellulitis of leg, left 01/16/2014  . PAD (peripheral artery disease) 2013    ABI: R 0.91, L 0.83  . Chronic venous insufficiency     Past Surgical History  Procedure Laterality Date  . Tympanoplasty  1960s    R side  . Coronary artery bypass graft  1998  . Cataract surgery    . Cholecystectomy    . Appendectomy      per pt report  . Colonoscopy  02/2010    extensive diverticulosis throughout, small internal hemorrhoids, rec rpt 5 yrs (Dr. Allyn Kenner)  . Hand surgery    . Lexiscan myoview  03/2011    Small basal inferolateral and anterolateral reversible perfusion defect suggests ischemia.  EF was normal.   old infarct, no new ischemia  . Toe surgery    . Abdominal hysterectomy  1975    TAH,BSO  . Vault prolapse a & p repair  2009    Dr [facility] hospital  . Dexa scan  09/2011    femur -0.8, forearm 0.6 => normal  . Esi  2014    L S1 transforaminal ESI x2 ([facility]) without improvement  . Abi  2013    R 0.91, L 0.83   Review of Systems Per HPI unless specifically indicated above     Objective:    BP 140/86 mmHg  Pulse 80  Temp(Src) 98 F (36.7 C) (Oral)  Wt 152 lb 4 oz (69.06 kg)  Wt Readings from Last 3 Encounters:  04/01/14 152 lb 4 oz (69.06 kg)  03/25/14 154 lb (69.854 kg)  03/24/14 149 lb (67.586 kg)    Physical Exam  Constitutional: She appears well-developed and well-nourished. No distress.  Musculoskeletal: She exhibits edema (2+ R pedal edema, tr on left).  Diminished pulses BLE but present Erythema and tenderness to palpation RLE to knee Persistent shallow ulcer medial R lower leg, continue slowly healing, ~3.5cm diameter, weeping  Skin: Skin is warm. There is erythema.  Psychiatric: She has a normal mood and affect.  Nursing note and vitals reviewed.      Assessment & Plan:   Problem List Items Addressed  This Visit    Venous ulcer of leg    Recurrent, slow to heal. Per [facility] nothing to do for R leg. If doppler neg for acute blood clot, will refer to wound clinic to assist with healing - pt agrees.    Pedal edema - Primary    Markedly worse leg swelling on right along with erythema noted today. Concern for recurrent DVT so  will obtain RLE ultrasound. If positive, will start coumadin and may need heme referral for assistance with management (recurrent LE bleeds while on xarelto with resultant anemia). If negative for DVT, would treat as cellulitis.    Relevant Orders      Lower Extremity Venous Duplex Right   Acute deep vein thrombosis (DVT) of distal vein of right lower extremity   Relevant Orders      Lower Extremity Venous Duplex Right       Follow up plan: Return in about 4 weeks (around 04/29/2014), or as needed, for follow up visit.

## 2014-04-01 NOTE — Patient Instructions (Addendum)
Pass by Marion's office today to schedule ultrasound today or tomorrow of right leg. Increase lasix to 2 tablets in the morning for 3 days. We will call you with results and plan - if blood clot, we may need to start coumadin. If no blood clot, we will refer you to wound clinic.

## 2014-04-02 ENCOUNTER — Ambulatory Visit (HOSPITAL_COMMUNITY): Payer: Medicare Other | Attending: Cardiology | Admitting: Cardiology

## 2014-04-02 ENCOUNTER — Telehealth: Payer: Self-pay | Admitting: Family Medicine

## 2014-04-02 DIAGNOSIS — M79604 Pain in right leg: Secondary | ICD-10-CM | POA: Insufficient documentation

## 2014-04-02 DIAGNOSIS — E1149 Type 2 diabetes mellitus with other diabetic neurological complication: Secondary | ICD-10-CM

## 2014-04-02 DIAGNOSIS — Z87891 Personal history of nicotine dependence: Secondary | ICD-10-CM | POA: Diagnosis not present

## 2014-04-02 DIAGNOSIS — I825Z1 Chronic embolism and thrombosis of unspecified deep veins of right distal lower extremity: Secondary | ICD-10-CM

## 2014-04-02 DIAGNOSIS — I739 Peripheral vascular disease, unspecified: Secondary | ICD-10-CM

## 2014-04-02 DIAGNOSIS — Z951 Presence of aortocoronary bypass graft: Secondary | ICD-10-CM | POA: Diagnosis not present

## 2014-04-02 DIAGNOSIS — I82521 Chronic embolism and thrombosis of right iliac vein: Secondary | ICD-10-CM | POA: Diagnosis not present

## 2014-04-02 DIAGNOSIS — I872 Venous insufficiency (chronic) (peripheral): Secondary | ICD-10-CM

## 2014-04-02 DIAGNOSIS — I10 Essential (primary) hypertension: Secondary | ICD-10-CM | POA: Diagnosis not present

## 2014-04-02 DIAGNOSIS — I2510 Atherosclerotic heart disease of native coronary artery without angina pectoris: Secondary | ICD-10-CM | POA: Diagnosis not present

## 2014-04-02 DIAGNOSIS — R600 Localized edema: Secondary | ICD-10-CM | POA: Diagnosis not present

## 2014-04-02 DIAGNOSIS — L97919 Non-pressure chronic ulcer of unspecified part of right lower leg with unspecified severity: Principal | ICD-10-CM

## 2014-04-02 DIAGNOSIS — E119 Type 2 diabetes mellitus without complications: Secondary | ICD-10-CM | POA: Diagnosis not present

## 2014-04-02 DIAGNOSIS — E785 Hyperlipidemia, unspecified: Secondary | ICD-10-CM | POA: Insufficient documentation

## 2014-04-02 DIAGNOSIS — M7989 Other specified soft tissue disorders: Secondary | ICD-10-CM | POA: Insufficient documentation

## 2014-04-02 DIAGNOSIS — I83019 Varicose veins of right lower extremity with ulcer of unspecified site: Secondary | ICD-10-CM

## 2014-04-02 MED ORDER — DOXYCYCLINE HYCLATE 100 MG PO CAPS: 100.0000 mg | ORAL_CAPSULE | Freq: Two times a day (BID) | ORAL | Status: DC

## 2014-04-02 NOTE — Progress Notes (Signed)
Right lower venous duplex performed

## 2014-04-02 NOTE — Telephone Encounter (Signed)
Attempted to call patient. No answer, no machine. Will try again later.  

## 2014-04-02 NOTE — Telephone Encounter (Signed)
Attempted to call. Phone line was busy. Will try again later.

## 2014-04-02 NOTE — Telephone Encounter (Signed)
plz notify - preliminary read showing no blood clot. I'd like her to start another antibiotic course for concern for cellulitis (doxy 5d course) and will refer to wound clinic to help with superficial R leg sore. Rosaria Ferries - I will finish note later today. plz fax when note closed.

## 2014-04-03 ENCOUNTER — Encounter: Payer: Self-pay | Admitting: Family Medicine

## 2014-04-03 NOTE — Assessment & Plan Note (Addendum)
Recurrent, slow to heal. Per [facility] nothing to do for R leg. If doppler neg for acute blood clot, will refer to wound clinic to assist with healing - pt agrees.

## 2014-04-03 NOTE — Assessment & Plan Note (Signed)
Markedly worse leg swelling on right along with erythema noted today. Concern for recurrent DVT so will obtain RLE ultrasound. If positive, will start coumadin and may need heme referral for assistance with management (recurrent LE bleeds while on xarelto with resultant anemia). If negative for DVT, would treat as cellulitis.

## 2014-04-03 NOTE — Telephone Encounter (Signed)
Patient notified and verbalized understanding. Will await call from [facility].

## 2014-04-07 ENCOUNTER — Encounter: Payer: Self-pay | Admitting: Gynecology

## 2014-04-07 ENCOUNTER — Telehealth: Payer: Self-pay | Admitting: Family Medicine

## 2014-04-07 ENCOUNTER — Ambulatory Visit (INDEPENDENT_AMBULATORY_CARE_PROVIDER_SITE_OTHER): Payer: Medicare Other | Admitting: Gynecology

## 2014-04-07 VITALS — BP 130/80 | Ht <= 58 in | Wt 153.0 lb

## 2014-04-07 DIAGNOSIS — Z01419 Encounter for gynecological examination (general) (routine) without abnormal findings: Secondary | ICD-10-CM | POA: Diagnosis not present

## 2014-04-07 DIAGNOSIS — N952 Postmenopausal atrophic vaginitis: Secondary | ICD-10-CM | POA: Diagnosis not present

## 2014-04-07 DIAGNOSIS — N8111 Cystocele, midline: Secondary | ICD-10-CM

## 2014-04-07 DIAGNOSIS — L292 Pruritus vulvae: Secondary | ICD-10-CM

## 2014-04-07 MED ORDER — NYSTATIN-TRIAMCINOLONE 100000-0.1 UNIT/GM-% EX OINT: 1.0000 "application " | TOPICAL_OINTMENT | Freq: Two times a day (BID) | CUTANEOUS | Status: DC

## 2014-04-07 NOTE — Progress Notes (Signed)
[name] 02-23-1928 [id number]        81 y.o.  [id number] for follow up exam. Several issues noted below.  Past medical history,surgical history, problem list, medications, allergies, family history and social history were all reviewed and documented as reviewed in the EPIC chart.  ROS:  Performed with pertinent positives and negatives included in the history, assessment and plan.   Additional significant findings :  none   Exam: [name] Vitals:   04/07/14 1135  BP: 130/80  Height: 4\' 9"  (1.448 m)  Weight: 153 lb (69.4 kg)   General appearance:  Normal affect, orientation and appearance. Skin: right lower extremity erythematous and swollen. Covered with bandage. HEENT: Without gross lesions.  No cervical or supraclavicular adenopathy. Thyroid normal.  Lungs:  Clear without wheezing, rales or rhonchi Cardiac: RR, without RMG Abdominal:  Soft, nontender, without masses, guarding, rebound, organomegaly or hernia Breasts:  Examined lying and sitting without masses, retractions, discharge or axillary adenopathy. Pelvic:  Ext/BUS/vagina with second-degree cystocele to the level of the introitus. Generalized atrophic changes. No specific lesions.  Adnexa  Without masses or tenderness    Anus and perineum  Normal   Rectovaginal  Normal sphincter tone without palpated masses or tenderness.    Assessment/Plan:  81 y.o. [id number] female for follow up exam..   1. Postmenopausal/atrophic genital changes. Patient does have a history of chronic vulvitis that she uses Mytrex cream intermittently with good results. Otherwise is doing well without significant vaginal dryness. Is not sexually active. Continue to monitor. Refill 1 year for Mytrex. 2. Cystocele. Stable by exam. To the level of the introitus the patient is without symptoms of pressure or discomfort.  History of vaginal vault prolapse/ANP repair 2009 by Dr. Rhodia [name]. Continue to monitor. 3. DEXA 2013 normal. Plan to repeat at  5 year interval. 4. Mammography 2014 and overdue. Recommended screening mammography and patient agrees to schedule. SBE monthly reviewed. 5. Colonoscopy 2011. Repeat at their recommended interval. 6. Pap smear 2012. No Pap smear done today. No history of significant abnormal Pap smears. Over the age of 47. Status post hysterectomy for benign indications. Stop screening per current screening guidelines and patient is comfortable with this. 7. Right extremity swelling and erythema. Patients being treated for lower extremity infection in fact has an appointment with wound care center today this week. Strongly encouraged her to follow up with her and to continue to follow up with Dr. Danise [name] who is treating her. 8. Health maintenance. No routine lab work done as she reports this done through her primary physician's office and other doctors appointments. Risk factor assessment does show age of sexual activity less than 8 years of age. Recommend follow up in one year.     [name] Auerbach MD, 12:08 PM 04/07/2014

## 2014-04-07 NOTE — Telephone Encounter (Signed)
Pt finished abx today and has appt with wound care center on Friday.  Her leg remains red and swollen.  She is still elevating and staying off of her feet as much as possible.  She is also bandaging her leg and using vaseline as instructed.  Denies pain.  She would like to know if there is anything else she can do until she can get to her appt on Friday.

## 2014-04-07 NOTE — Patient Instructions (Signed)
Continue to follow up with Dr Sharen Hones and the wound care center in reference to your right leg.  You may obtain a copy of any labs that were done today by logging onto MyChart as outlined in the instructions provided with your AVS (after visit summary). The office will not call with normal lab results but certainly if there are any significant abnormalities then we will contact you.   Health Maintenance, Female A healthy lifestyle and preventative care can promote health and wellness.  Maintain regular health, dental, and eye exams.  Eat a healthy diet. Foods like vegetables, fruits, whole grains, low-fat dairy products, and lean protein foods contain the nutrients you need without too many calories. Decrease your intake of foods high in solid fats, added sugars, and salt. Get information about a proper diet from your caregiver, if necessary.  Regular physical exercise is one of the most important things you can do for your health. Most adults should get at least 150 minutes of moderate-intensity exercise (any activity that increases your heart rate and causes you to sweat) each week. In addition, most adults need muscle-strengthening exercises on 2 or more days a week.   Maintain a healthy weight. The body mass index (BMI) is a screening tool to identify possible weight problems. It provides an estimate of body fat based on height and weight. Your caregiver can help determine your BMI, and can help you achieve or maintain a healthy weight. For adults 20 years and older:  A BMI below 18.5 is considered underweight.  A BMI of 18.5 to 24.9 is normal.  A BMI of 25 to 29.9 is considered overweight.  A BMI of 30 and above is considered obese.  Maintain normal blood lipids and cholesterol by exercising and minimizing your intake of saturated fat. Eat a balanced diet with plenty of fruits and vegetables. Blood tests for lipids and cholesterol should begin at age 14 and be repeated every 5 years.  If your lipid or cholesterol levels are high, you are over 50, or you are a high risk for heart disease, you may need your cholesterol levels checked more frequently.Ongoing high lipid and cholesterol levels should be treated with medicines if diet and exercise are not effective.  If you smoke, find out from your caregiver how to quit. If you do not use tobacco, do not start.  Lung cancer screening is recommended for adults aged 6 80 years who are at high risk for developing lung cancer because of a history of smoking. Yearly low-dose computed tomography (CT) is recommended for people who have at least a 30-pack-year history of smoking and are a current smoker or have quit within the past 15 years. A pack year of smoking is smoking an average of 1 pack of cigarettes a day for 1 year (for example: 1 pack a day for 30 years or 2 packs a day for 15 years). Yearly screening should continue until the smoker has stopped smoking for at least 15 years. Yearly screening should also be stopped for people who develop a health problem that would prevent them from having lung cancer treatment.  If you are pregnant, do not drink alcohol. If you are breastfeeding, be very cautious about drinking alcohol. If you are not pregnant and choose to drink alcohol, do not exceed 1 drink per day. One drink is considered to be 12 ounces (355 mL) of beer, 5 ounces (148 mL) of wine, or 1.5 ounces (44 mL) of liquor.  Avoid use of  street drugs. Do not share needles with anyone. Ask for help if you need support or instructions about stopping the use of drugs.  High blood pressure causes heart disease and increases the risk of stroke. Blood pressure should be checked at least every 1 to 2 years. Ongoing high blood pressure should be treated with medicines, if weight loss and exercise are not effective.  If you are 55 to 79 years old, ask your caregiver if you should take aspirin to prevent strokes.  Diabetes screening involves  taking a blood sample to check your fasting blood sugar level. This should be done once every 3 years, after age 24, if you are within normal weight and without risk factors for diabetes. Testing should be considered at a younger age or be carried out more frequently if you are overweight and have at least 1 risk factor for diabetes.  Breast cancer screening is essential preventative care for women. You should practice "breast self-awareness." This means understanding the normal appearance and feel of your breasts and may include breast self-examination. Any changes detected, no matter how small, should be reported to a caregiver. Women in their 9s and 30s should have a clinical breast exam (CBE) by a caregiver as part of a regular health exam every 1 to 3 years. After age 30, women should have a CBE every year. Starting at age 69, women should consider having a mammogram (breast X-ray) every year. Women who have a family history of breast cancer should talk to their caregiver about genetic screening. Women at a high risk of breast cancer should talk to their caregiver about having an MRI and a mammogram every year.  Breast cancer gene (BRCA)-related cancer risk assessment is recommended for women who have family members with BRCA-related cancers. BRCA-related cancers include breast, ovarian, tubal, and peritoneal cancers. Having family members with these cancers may be associated with an increased risk for harmful changes (mutations) in the breast cancer genes BRCA1 and BRCA2. Results of the assessment will determine the need for genetic counseling and BRCA1 and BRCA2 testing.  The Pap test is a screening test for cervical cancer. Women should have a Pap test starting at age 60. Between ages 62 and 43, Pap tests should be repeated every 2 years. Beginning at age 22, you should have a Pap test every 3 years as long as the past 3 Pap tests have been normal. If you had a hysterectomy for a problem that was not  cancer or a condition that could lead to cancer, then you no longer need Pap tests. If you are between ages 38 and 9, and you have had normal Pap tests going back 10 years, you no longer need Pap tests. If you have had past treatment for cervical cancer or a condition that could lead to cancer, you need Pap tests and screening for cancer for at least 20 years after your treatment. If Pap tests have been discontinued, risk factors (such as a new sexual partner) need to be reassessed to determine if screening should be resumed. Some women have medical problems that increase the chance of getting cervical cancer. In these cases, your caregiver may recommend more frequent screening and Pap tests.  The human papillomavirus (HPV) test is an additional test that may be used for cervical cancer screening. The HPV test looks for the virus that can cause the cell changes on the cervix. The cells collected during the Pap test can be tested for HPV. The HPV test could  be used to screen women aged 67 years and older, and should be used in women of any age who have unclear Pap test results. After the age of 12, women should have HPV testing at the same frequency as a Pap test.  Colorectal cancer can be detected and often prevented. Most routine colorectal cancer screening begins at the age of 26 and continues through age 56. However, your caregiver may recommend screening at an earlier age if you have risk factors for colon cancer. On a yearly basis, your caregiver may provide home test kits to check for hidden blood in the stool. Use of a small camera at the end of a tube, to directly examine the colon (sigmoidoscopy or colonoscopy), can detect the earliest forms of colorectal cancer. Talk to your caregiver about this at age 6, when routine screening begins. Direct examination of the colon should be repeated every 5 to 10 years through age 67, unless early forms of pre-cancerous polyps or small growths are  found.  Hepatitis C blood testing is recommended for all people born from [birth year] through 1965 and any individual with known risks for hepatitis C.  Practice safe sex. Use condoms and avoid high-risk sexual practices to reduce the spread of sexually transmitted infections (STIs). Sexually active women aged 8 and younger should be checked for Chlamydia, which is a common sexually transmitted infection. Older women with new or multiple partners should also be tested for Chlamydia. Testing for other STIs is recommended if you are sexually active and at increased risk.  Osteoporosis is a disease in which the bones lose minerals and strength with aging. This can result in serious bone fractures. The risk of osteoporosis can be identified using a bone density scan. Women ages 69 and over and women at risk for fractures or osteoporosis should discuss screening with their caregivers. Ask your caregiver whether you should be taking a calcium supplement or vitamin D to reduce the rate of osteoporosis.  Menopause can be associated with physical symptoms and risks. Hormone replacement therapy is available to decrease symptoms and risks. You should talk to your caregiver about whether hormone replacement therapy is right for you.  Use sunscreen. Apply sunscreen liberally and repeatedly throughout the day. You should seek shade when your shadow is shorter than you. Protect yourself by wearing long sleeves, pants, a wide-brimmed hat, and sunglasses year round, whenever you are outdoors.  Notify your caregiver of new moles or changes in moles, especially if there is a change in shape or color. Also notify your caregiver if a mole is larger than the size of a pencil eraser.  Stay current with your immunizations. Document Released: 10/04/2010 Document Revised: 07/16/2012 Document Reviewed: 10/04/2010 [facility] Patient Information 2014 [place].

## 2014-04-08 MED ORDER — DOXYCYCLINE HYCLATE 100 MG PO CAPS: 100.0000 mg | ORAL_CAPSULE | Freq: Two times a day (BID) | ORAL | Status: DC

## 2014-04-08 NOTE — Telephone Encounter (Signed)
Spoke with patient and she said the swelling and redness may have improved some while on the abx. Sent in refill and advised to call back if worsening and to keep appt with wound center. Pt verbalized understanding.

## 2014-04-08 NOTE — Telephone Encounter (Signed)
Did antibiotic course (5 days of doxy) seem to help leg redness/swelling at all? Would offer to extend antibiotic course another 5 days if it was beneficial

## 2014-04-08 NOTE — Telephone Encounter (Signed)
Patient called again.  She said her leg is very red and swollen. Please call patient back at [phone] from now until 1:00 and after 3:00.

## 2014-04-11 ENCOUNTER — Encounter (HOSPITAL_BASED_OUTPATIENT_CLINIC_OR_DEPARTMENT_OTHER): Payer: Medicare Other | Attending: Internal Medicine

## 2014-04-11 DIAGNOSIS — L97319 Non-pressure chronic ulcer of right ankle with unspecified severity: Secondary | ICD-10-CM | POA: Insufficient documentation

## 2014-04-11 DIAGNOSIS — I87331 Chronic venous hypertension (idiopathic) with ulcer and inflammation of right lower extremity: Secondary | ICD-10-CM | POA: Insufficient documentation

## 2014-04-11 DIAGNOSIS — L97919 Non-pressure chronic ulcer of unspecified part of right lower leg with unspecified severity: Secondary | ICD-10-CM | POA: Diagnosis not present

## 2014-04-11 LAB — GLUCOSE, CAPILLARY: Glucose-Capillary: 134 mg/dL — ABNORMAL HIGH (ref 70–99)

## 2014-04-14 NOTE — Progress Notes (Signed)
[facility]  NAME:  ONAWA, [name]                   ACCOUNT NO.:  MEDICAL RECORD NO.:  [id number]      DATE OF BIRTH:  1927-05-05  PHYSICIAN:  Ricard Dillon, M.D. VISIT DATE:  04/11/2014                                  OFFICE VISIT   LOCATION:  [place].  HISTORY:  This is an 81 year old woman, who arrives for our review of right leg swelling and ulceration on the medial right ankle.  She comes with [name] complex medical history, and we appreciated the referral by Dr. Danise Mina.  The history is somewhat difficult to follow.  It is clear that this lady has [name] history of bilateral venostasis and incompetence.  She has recently been followed by Dr. Kellie Simmering and had an ablation of the left great saphenous vein from the proximal calf near the saphenofemoral junction.  She did well after this.  Sometime in early December there is reference to [name] shallow venous ulcer with surrounding cellulitis on the medial right ankle.  There is [name] note from Dr. Dwyane Dee, her endocrinologist on December 7 suggesting she had 1+ right lower extremity pedal edema, shallow ulcer near the medial border of the leg with yellowish discharge and erythema.  She was put on doxycycline and followed up by Dr. Danise Mina [name] week later.  He makes [name] comment that she did not previously tolerate Unna boots on this leg.  Dr. Kellie Simmering ordered [name] duplex ultrasound of the left leg on December 21.  She had [name] duplex ultrasound of the right leg on November 20 suggesting [name] partially occlusive chronic thrombus noted in the right proximal calf.  [name] subsequent duplex ultrasound of the right leg on December 30th showed chronic thrombus in 1 of the 4 proximal gastrocnemius veins.  All other veins showed no evidence of right lower extremity deep or superficial venous thrombosis. An x-ray of the right foot on November 5th showed severe diffuse osteoarthritis.  The patient states today that this  swelling and pain in her right leg is worse.  She appears to have had recent treatment with doxycycline again without real improvement.  She complains of drainage and apparently has had [name] history of bleeding varicosities, especially on the left leg on Xarelto, which was discontinued.  The patient is [name] type 2 diabetic.  PAST MEDICAL HISTORY:  Include hypertension, coronary artery disease, status post CABG, anxiety, hyperlipidemia, peripheral vascular disease with an ABI on the right of 0.93, microcytic anemia, diverticulosis, irritable bowel syndrome, and lumbar disk herniation.  PAST SURGICAL HISTORY:  CABG, cataract, cholecystectomy, appendectomy, and hysterectomy.  CURRENT MEDICATIONS:  Aspirin 81 daily, ferrous sulfate 325 b.i.d., Lasix 20 daily, calcium 600 daily, doxycycline 100 b.i.d., Actos 30 daily, Nitrostat 0.4 p.r.n., Pravachol 20 daily, Altace 5 daily, Desyrel 150 at bedtime, and Norco p.r.n.  PHYSICAL EXAMINATION:  VITAL SIGNS:  Temperature is 98.1, pulse 65, 135/51, blood glucose is 134, weight at 153 pounds.  RESPIRATORY:  Shallow, but otherwise clear air entry.  CARDIAC:  No overt signs of either right or left ventricular heart failure.  RIGHT LOWER EXTREMITY:  There was erythema at the right leg swelling and warmth especially on the medial aspect, all below the right knee.  The area on the medial  right ankle has some denuded superficial epithelium but no true wound here.  There was no draining and no opening.  The patient is tender in the calf area.  Points out [name] small area that she says she scratched and it opened and wept but again there is no open area here either.  Although, she has [name] history of PAD peripheral pulses were palpable and this does not look like an arterial problem.  She has the venous harvest scar from her previous CABG surgery.  IMPRESSION:  Puzzling erythema and the edema of the right lower leg currently without actual open ulceration.   Looking through the records, it appears that this has been present since at least early December and the edema has been worsening.  She appears to have had 2 duplex ultrasounds, which do not show convincing evidence of [name] significant deep venous thrombosis which would explain the clinical presentation.  She does not have [name] Baker's cyst that I can feel.  She has been treated with rounds of antibiotics or antibiotics without clear improvement.  I found the etiology of the edema, erythema, and some degree of tenderness here to be uncertain.  Certainly, the patient has [name] history of venous disease and also arterial disease but none of these seemed to adequately explain her increasing edema and the situation in the right leg.  She tells me that she absolutely cannot tolerate aggressive compression, although I could not see exactly where that was tried in the past, thus she apparently did not tolerate the Unna boots.  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  As there is no open wound here, I did put some calcium alginate over the medial right ankle malleolus area to help with any drainage.  As mentioned, there was some superficial areas of destruction of the epithelium but no obvious open wound.  I understand why cellulitis was [name] consideration, although I do not know for sure what this is.  I would consider [name] CT scan with contrast of the lower leg to exclude significant myositis, malignancy, etc. as rare causes of unilateral swollen right leg.  For now, I allowed her to continue her antibiotics with some coaxing and convinced her into [name] Kerlix Coban wrap, although I am not completely certain that she will tolerate this either.  I will see her again in [name] week's time.  I may contact her primary doctor directly to see how long indeed the clinical situation has been present in this leg.          ______________________________ Ricard Dillon,  M.D.     MGR/MEDQ  D:  04/11/2014  T:  04/12/2014  Job:  [id number]

## 2014-04-16 ENCOUNTER — Other Ambulatory Visit: Payer: Self-pay

## 2014-04-16 NOTE — Telephone Encounter (Signed)
Pt left v/m requesting rx hydrocodone apap. Call when ready for pick up. Pt would like to pick up rx on 04/17/13.

## 2014-04-17 ENCOUNTER — Telehealth: Payer: Self-pay | Admitting: *Deleted

## 2014-04-17 DIAGNOSIS — I87331 Chronic venous hypertension (idiopathic) with ulcer and inflammation of right lower extremity: Secondary | ICD-10-CM | POA: Diagnosis not present

## 2014-04-17 DIAGNOSIS — L97319 Non-pressure chronic ulcer of right ankle with unspecified severity: Secondary | ICD-10-CM | POA: Diagnosis not present

## 2014-04-17 DIAGNOSIS — L97919 Non-pressure chronic ulcer of unspecified part of right lower leg with unspecified severity: Secondary | ICD-10-CM | POA: Diagnosis not present

## 2014-04-17 MED ORDER — HYDROCODONE-ACETAMINOPHEN 5-325 MG PO TABS: 0.5000 | ORAL_TABLET | Freq: Four times a day (QID) | ORAL | Status: DC | PRN

## 2014-04-17 NOTE — Telephone Encounter (Signed)
Patient called requesting refill on estrogen due to hot flashes recurring. She said they started back 5 days ago and are happening multiple times a day. She is absolutely miserable and is begging for help.

## 2014-04-17 NOTE — Telephone Encounter (Signed)
Patient notified and Rx placed up front for pick up. 

## 2014-04-17 NOTE — Telephone Encounter (Signed)
Called patient and someone picked up the phone and hung it up. Have tried calling numerous times since and line has been busy. Will continue trying.

## 2014-04-17 NOTE — Telephone Encounter (Signed)
Printed and in Kim's box 

## 2014-04-18 NOTE — Telephone Encounter (Signed)
Patient notified and will try supplement.

## 2014-04-18 NOTE — Telephone Encounter (Signed)
Should not be on estrogen with history of DVT. Recommend try black cohosh OTC supplement. If that doesn't help would start SSRI.

## 2014-04-21 DIAGNOSIS — L97919 Non-pressure chronic ulcer of unspecified part of right lower leg with unspecified severity: Secondary | ICD-10-CM | POA: Diagnosis not present

## 2014-04-21 DIAGNOSIS — I87331 Chronic venous hypertension (idiopathic) with ulcer and inflammation of right lower extremity: Secondary | ICD-10-CM | POA: Diagnosis not present

## 2014-04-21 DIAGNOSIS — L97319 Non-pressure chronic ulcer of right ankle with unspecified severity: Secondary | ICD-10-CM | POA: Diagnosis not present

## 2014-04-21 LAB — GLUCOSE, CAPILLARY: Glucose-Capillary: 89 mg/dL (ref 70–99)

## 2014-04-28 DIAGNOSIS — F411 Generalized anxiety disorder: Secondary | ICD-10-CM | POA: Diagnosis not present

## 2014-04-28 DIAGNOSIS — F42 Obsessive-compulsive disorder: Secondary | ICD-10-CM | POA: Diagnosis not present

## 2014-05-01 DIAGNOSIS — L97319 Non-pressure chronic ulcer of right ankle with unspecified severity: Secondary | ICD-10-CM | POA: Diagnosis not present

## 2014-05-01 DIAGNOSIS — I87331 Chronic venous hypertension (idiopathic) with ulcer and inflammation of right lower extremity: Secondary | ICD-10-CM | POA: Diagnosis not present

## 2014-05-01 DIAGNOSIS — L97919 Non-pressure chronic ulcer of unspecified part of right lower leg with unspecified severity: Secondary | ICD-10-CM | POA: Diagnosis not present

## 2014-05-09 ENCOUNTER — Encounter (HOSPITAL_BASED_OUTPATIENT_CLINIC_OR_DEPARTMENT_OTHER): Payer: Medicare Other | Attending: Internal Medicine

## 2014-05-09 DIAGNOSIS — I87331 Chronic venous hypertension (idiopathic) with ulcer and inflammation of right lower extremity: Secondary | ICD-10-CM | POA: Diagnosis present

## 2014-05-09 DIAGNOSIS — L97311 Non-pressure chronic ulcer of right ankle limited to breakdown of skin: Secondary | ICD-10-CM | POA: Diagnosis not present

## 2014-05-09 DIAGNOSIS — L97911 Non-pressure chronic ulcer of unspecified part of right lower leg limited to breakdown of skin: Secondary | ICD-10-CM | POA: Diagnosis not present

## 2014-05-16 DIAGNOSIS — L97911 Non-pressure chronic ulcer of unspecified part of right lower leg limited to breakdown of skin: Secondary | ICD-10-CM | POA: Diagnosis not present

## 2014-05-16 DIAGNOSIS — I87331 Chronic venous hypertension (idiopathic) with ulcer and inflammation of right lower extremity: Secondary | ICD-10-CM | POA: Diagnosis not present

## 2014-05-16 DIAGNOSIS — L97311 Non-pressure chronic ulcer of right ankle limited to breakdown of skin: Secondary | ICD-10-CM | POA: Diagnosis not present

## 2014-05-19 ENCOUNTER — Other Ambulatory Visit: Payer: Self-pay | Admitting: *Deleted

## 2014-05-19 NOTE — Telephone Encounter (Signed)
Patient left a voicemail requesting a refill on her Hydrocodone. Last office visit 04/01/14, last refill 04/17/14 #60. Call patient when rx is ready for pickup.

## 2014-05-20 MED ORDER — HYDROCODONE-ACETAMINOPHEN 5-325 MG PO TABS: 0.5000 | ORAL_TABLET | Freq: Four times a day (QID) | ORAL | Status: DC | PRN

## 2014-05-20 NOTE — Telephone Encounter (Signed)
pritned and in Kim's box.

## 2014-05-20 NOTE — Telephone Encounter (Signed)
Patient notified and Rx placed up front for pick up. 

## 2014-05-23 DIAGNOSIS — I87331 Chronic venous hypertension (idiopathic) with ulcer and inflammation of right lower extremity: Secondary | ICD-10-CM | POA: Diagnosis not present

## 2014-05-23 DIAGNOSIS — L97911 Non-pressure chronic ulcer of unspecified part of right lower leg limited to breakdown of skin: Secondary | ICD-10-CM | POA: Diagnosis not present

## 2014-05-23 DIAGNOSIS — L97311 Non-pressure chronic ulcer of right ankle limited to breakdown of skin: Secondary | ICD-10-CM | POA: Diagnosis not present

## 2014-05-26 ENCOUNTER — Other Ambulatory Visit: Payer: Self-pay | Admitting: Family Medicine

## 2014-05-30 DIAGNOSIS — L97911 Non-pressure chronic ulcer of unspecified part of right lower leg limited to breakdown of skin: Secondary | ICD-10-CM | POA: Diagnosis not present

## 2014-05-30 DIAGNOSIS — L97311 Non-pressure chronic ulcer of right ankle limited to breakdown of skin: Secondary | ICD-10-CM | POA: Diagnosis not present

## 2014-05-30 DIAGNOSIS — I87331 Chronic venous hypertension (idiopathic) with ulcer and inflammation of right lower extremity: Secondary | ICD-10-CM | POA: Diagnosis not present

## 2014-06-03 ENCOUNTER — Telehealth: Payer: Self-pay | Admitting: Family Medicine

## 2014-06-03 NOTE — Telephone Encounter (Signed)
Patient Name: [name] DOB: 27-Feb-1928 Initial Comment Caller states they are having back pain and chills along with leg pain. Nurse Assessment Nurse: [name], RN, [name] Date/Time (Eastern Time): 06/03/2014 1:59:24 PM Confirm and document reason for call. If symptomatic, describe symptoms. ---Caller states she developed severe, increased low back pain that goes into her right leg two days ago. She developed severe right knee pain two days ago. No known fever. No injury in the past 3 days. Has the patient traveled out of the country within the last 30 days? ---No Does the patient require triage? ---Yes Related visit to physician within the last 2 weeks? ---No Does the PT have any chronic conditions? (i.e. diabetes, asthma, etc.) ---Yes List chronic conditions. ---Chronic back pain, Ulcer on right leg, Diabetes, High Blood Pressure, Heart problems Guidelines Guideline Title Affirmed Question Affirmed Notes Back Pain [1] Abdominal pain AND [2] age > 43 Final Disposition User Go to ED Now (or PCP triage) [name], RN, [name] Comments Caller declined the Go to ER disposition. Dr. Danise Mina doesn't have an open appointment and she declines to see another MD at the office. [name] notified that someone from the office will call her back. Called the office backline and notified [name].

## 2014-06-03 NOTE — Telephone Encounter (Signed)
Do recommend evaluation for this. May see me at next available or another provider in office.

## 2014-06-03 NOTE — Telephone Encounter (Signed)
Spoke with patient. She adamantly refused to see another provider in the office. Scheduled first available with you on 06/05/14. She denied any urinary symptoms, abd pain and said no fever or chills anymore. I advised that if anything changed, she needed to be seen sooner. She verbalized understanding.

## 2014-06-05 ENCOUNTER — Encounter: Payer: Self-pay | Admitting: Family Medicine

## 2014-06-05 ENCOUNTER — Ambulatory Visit (INDEPENDENT_AMBULATORY_CARE_PROVIDER_SITE_OTHER): Payer: Medicare Other | Admitting: Family Medicine

## 2014-06-05 VITALS — BP 138/56 | HR 84 | Temp 98.0°F | Wt 148.0 lb

## 2014-06-05 DIAGNOSIS — M545 Low back pain, unspecified: Secondary | ICD-10-CM

## 2014-06-05 DIAGNOSIS — I83019 Varicose veins of right lower extremity with ulcer of unspecified site: Secondary | ICD-10-CM

## 2014-06-05 DIAGNOSIS — I87311 Chronic venous hypertension (idiopathic) with ulcer of right lower extremity: Secondary | ICD-10-CM | POA: Diagnosis not present

## 2014-06-05 DIAGNOSIS — L97919 Non-pressure chronic ulcer of unspecified part of right lower leg with unspecified severity: Secondary | ICD-10-CM

## 2014-06-05 DIAGNOSIS — G8929 Other chronic pain: Secondary | ICD-10-CM | POA: Diagnosis not present

## 2014-06-05 MED ORDER — HYDROCODONE-ACETAMINOPHEN 5-325 MG PO TABS: 0.5000 | ORAL_TABLET | Freq: Four times a day (QID) | ORAL | Status: DC | PRN

## 2014-06-05 NOTE — Progress Notes (Signed)
BP 138/56 mmHg  Pulse 84  Temp(Src) 98 F (36.7 C) (Oral)  Wt 148 lb (67.132 kg)   CC: back pain  Subjective:    [name], female    DOB: November 15, 1927, 81 y.o.   MRN: [id number]  HPI: [name] is a 81 y.o. female presenting on 06/05/2014 for Back Pain   Worsening back pain especially bad Saturday night after working all day. Sunday night had trouble getting out of bed 2/2 back pain. Endorses lower back pain that spreads to left. Has not worked all week 2/2 back pain. Feels generalized malaise and weakness. Anxiety worsened - feels "not in control". Takes hydrocodone 5/325mg  1/2 tab QID. Denies constipation. Eats raisin box nightly.   Denies inciting trauma/falls, saddle anesthesia or paresthesias. No bowel/bladder accidents.  Unsure if has TENS unit at home. Has been using back massager with heating pad.   Chronic back pain Date: 03/2012 lumbar DDD with herniation - s/p L S1 transforaminal ESI x2 without improvement (10/2012 Dr. Sharlet Salina)  Goes to wound care in [place] - followed by Dr Alford Highland. Currently on doxycyclline course.   Relevant past medical, surgical, family and social history reviewed and updated as indicated. Interim medical history since our last visit reviewed. Allergies and medications reviewed and updated. Current Outpatient Prescriptions on File Prior to Visit  Medication Sig  . ALPRAZolam (XANAX) 1 MG tablet Take 0.5 mg by mouth 4 (four) times daily. For anxiety  . Ascorbic Acid (VITAMIN C PO) Take 1,000 mg by mouth daily.   Marland Kitchen aspirin EC 81 MG tablet Take 81 mg by mouth daily.  Marland Kitchen CALCIUM PO Take 600 mg by mouth daily.   . Cyanocobalamin (VITAMIN B 12 PO) Take 1 tablet by mouth daily.  Marland Kitchen Dexlansoprazole (DEXILANT) 30 MG capsule TAKE 1 CAPSULE BY MOUTH DAILY for heartburn  . ferrous sulfate 325 (65 FE) MG tablet Take 1 tablet (325 mg total) by mouth 2 (two) times daily with a meal.  . furosemide (LASIX) 20 MG tablet Take 1 tablet (20  mg total) by mouth daily. For leg swelling  . glucosamine-chondroitin 500-400 MG tablet Take 1 tablet by mouth daily.   Marland Kitchen loperamide (IMODIUM A-D) 2 MG tablet Take 2 mg by mouth 4 (four) times daily as needed for diarrhea or loose stools.  . nystatin-triamcinolone ointment (MYCOLOG) Apply 1 application topically 2 (two) times daily. As needed for itching  . pioglitazone (ACTOS) 30 MG tablet Take 30 mg by mouth daily. For diabetes  . pravastatin (PRAVACHOL) 20 MG tablet TAKE 1 TABLET BY MOUTH DAILY. for cholesterol  . ramipril (ALTACE) 5 MG capsule TAKE 1 CAPSULE BY MOUTH EVERY DAY for blood pressure  . traZODone (DESYREL) 150 MG tablet Take 150 mg by mouth at bedtime. For sleep  . VITAMIN E PO Take 1 tablet by mouth daily.   . diclofenac sodium (VOLTAREN) 1 % GEL Apply 1 application topically 3 (three) times daily. (Patient not taking: Reported on 06/05/2014)  . nitroGLYCERIN (NITROSTAT) 0.4 MG SL tablet Place 0.4 mg under the tongue every 5 (five) minutes as needed. For chest pain May take up to 3 doses, if pain persists call 911.  . pravastatin (PRAVACHOL) 20 MG tablet TAKE 1 TABLET BY MOUTH DAILY.  Marland Kitchen triamcinolone cream (KENALOG) 0.1 % Apply 1 application topically daily.   No current facility-administered medications on file prior to visit.    Review of Systems Per HPI unless specifically indicated above     Objective:  BP 138/56 mmHg  Pulse 84  Temp(Src) 98 F (36.7 C) (Oral)  Wt 148 lb (67.132 kg)  Wt Readings from Last 3 Encounters:  06/05/14 148 lb (67.132 kg)  04/07/14 153 lb (69.4 kg)  04/01/14 152 lb 4 oz (69.06 kg)    Physical Exam  Constitutional: She appears well-developed and well-nourished. No distress.  Musculoskeletal: She exhibits no edema.  Mild pain lower lumbar and sacral midline spine + paraspinous mm tenderness lower lumbar region Neg SLR bilaterally. Stiff so unable to do FABER or really hip mobility. No pain at SIJ, GTB or sciatic notch  bilaterally.  RLE in dressing  Neurological: She has normal strength. No sensory deficit.  Reflex Scores:      Patellar reflexes are 2+ on the right side and 2+ on the left side. Skin: Skin is warm and dry.  Psychiatric: Her mood appears anxious.  Nursing note and vitals reviewed.      Assessment & Plan:   Problem List Items Addressed This Visit    Venous ulcer of leg    Has now established with wound clinic. Encouraged continued f/u with wound doctor.      Chronic lower back pain - Primary    Anticipate acute flare of chronic lower back pain from known osteoarthritis, spinal stenosis, and herniated disc. No red flags today. ESI did not help in past. Pt declines PT referral. Unsure if she has TENS unit at home - will check this and if not we will prescribe one. Refilled hydrocodone in interim.      Relevant Medications   HYDROcodone-acetaminophen (NORCO/VICODIN) 5-325 MG per tablet       Follow up plan: Return as needed.

## 2014-06-05 NOTE — Assessment & Plan Note (Signed)
Has now established with wound clinic. Encouraged continued f/u with wound doctor.

## 2014-06-05 NOTE — Patient Instructions (Addendum)
For back - continue massage machine you have at home.  See if you have TENS unit at home and if not let me know to prescribe one. I've printed out script for hydrocodone to fill in 2 weeks.

## 2014-06-05 NOTE — Assessment & Plan Note (Signed)
Anticipate acute flare of chronic lower back pain from known osteoarthritis, spinal stenosis, and herniated disc. No red flags today. ESI did not help in past. Pt declines PT referral. Unsure if she has TENS unit at home - will check this and if not we will prescribe one. Refilled hydrocodone in interim.

## 2014-06-05 NOTE — Progress Notes (Signed)
Pre visit review using our clinic review tool, if applicable. No additional management support is needed unless otherwise documented below in the visit note. 

## 2014-06-06 ENCOUNTER — Encounter (HOSPITAL_BASED_OUTPATIENT_CLINIC_OR_DEPARTMENT_OTHER): Payer: Medicare Other | Attending: Internal Medicine

## 2014-06-06 DIAGNOSIS — L97311 Non-pressure chronic ulcer of right ankle limited to breakdown of skin: Secondary | ICD-10-CM | POA: Insufficient documentation

## 2014-06-06 DIAGNOSIS — I87331 Chronic venous hypertension (idiopathic) with ulcer and inflammation of right lower extremity: Secondary | ICD-10-CM | POA: Insufficient documentation

## 2014-06-06 DIAGNOSIS — Z9119 Patient's noncompliance with other medical treatment and regimen: Secondary | ICD-10-CM | POA: Insufficient documentation

## 2014-06-06 DIAGNOSIS — E119 Type 2 diabetes mellitus without complications: Secondary | ICD-10-CM | POA: Diagnosis not present

## 2014-06-09 ENCOUNTER — Encounter: Payer: Self-pay | Admitting: Endocrinology

## 2014-06-09 ENCOUNTER — Ambulatory Visit (INDEPENDENT_AMBULATORY_CARE_PROVIDER_SITE_OTHER): Payer: Medicare Other | Admitting: Endocrinology

## 2014-06-09 VITALS — BP 126/70 | HR 83 | Temp 98.5°F | Resp 14 | Ht <= 58 in | Wt 149.6 lb

## 2014-06-09 DIAGNOSIS — E119 Type 2 diabetes mellitus without complications: Secondary | ICD-10-CM

## 2014-06-09 DIAGNOSIS — G629 Polyneuropathy, unspecified: Secondary | ICD-10-CM

## 2014-06-09 DIAGNOSIS — I10 Essential (primary) hypertension: Secondary | ICD-10-CM

## 2014-06-09 DIAGNOSIS — E1142 Type 2 diabetes mellitus with diabetic polyneuropathy: Secondary | ICD-10-CM | POA: Diagnosis not present

## 2014-06-09 DIAGNOSIS — I739 Peripheral vascular disease, unspecified: Secondary | ICD-10-CM | POA: Diagnosis not present

## 2014-06-09 LAB — COMPREHENSIVE METABOLIC PANEL
ALBUMIN: 3.4 g/dL — AB (ref 3.5–5.2)
ALT: 14 U/L (ref 0–35)
AST: 20 U/L (ref 0–37)
Alkaline Phosphatase: 98 U/L (ref 39–117)
BUN: 22 mg/dL (ref 6–23)
CALCIUM: 9.3 mg/dL (ref 8.4–10.5)
CHLORIDE: 104 meq/L (ref 96–112)
CO2: 27 meq/L (ref 19–32)
Creatinine, Ser: 0.86 mg/dL (ref 0.40–1.20)
GFR: 66.65 mL/min (ref >60.00)
Glucose, Bld: 78 mg/dL (ref 70–99)
POTASSIUM: 4.2 meq/L (ref 3.5–5.1)
Sodium: 137 mEq/L (ref 135–145)
TOTAL PROTEIN: 7 g/dL (ref 6.0–8.3)
Total Bilirubin: 0.2 mg/dL (ref 0.2–1.2)

## 2014-06-09 LAB — HEMOGLOBIN A1C: HEMOGLOBIN A1C: 7 % — AB (ref 4.6–6.5)

## 2014-06-09 NOTE — Progress Notes (Signed)
[name], female   DOB: December 01, 1927, 81 y.o.   MRN: [id number]   Reason for Appointment: Diabetes follow-up   History of Present Illness   Diagnosis: Type 2 DIABETES MELITUS, date of diagnosis: 1991   She has had mild diabetes for several years and has been on Actos only since about 2001 Her A1c has generally been under 7% She was given a trial of metformin because of her difficulty with weight loss and also edema but she felt that it made her blood sugars go up and will not try it again Also she thinks her blood sugars were higher with a trial of Januvia  Currently not having any side effects from Actos and may have had less edema with using 30 mg since 01/2014 Again she is is only checking her sugar in the afternoon randomly and more than 2 hours after her lunch  A1c has generally been near-normal Did not bring her monitor for download and not clear how often she is checking that his sugars Needs another A1c today     Oral hypoglycemic drugs: Actos 30 mg daily        Side effects from medications: None Monitors blood glucose: Once a day.    Glucometer: One Touch.          Blood Glucose readings from recall: 95-129 done only at 3 PM, forgets to do it at other times  Hypoglycemia frequency: Never.           Meals: 2  meals per day.  usually compliant with diet         Physical activity:  minimal           Dietician visit: Most recent: Several years ago           Last pneumococcal vaccine:  06/2013   Wt Readings from Last 3 Encounters:  06/09/14 149 lb 9.6 oz (67.858 kg)  06/05/14 148 lb (67.132 kg)  04/07/14 153 lb (69.4 kg)   Lab Results  Component Value Date   HGBA1C 6.6* 03/10/2014   HGBA1C 6.6* 11/18/2013   HGBA1C 6.6* 07/22/2013   Lab Results  Component Value Date   MICROALBUR 0.2 03/10/2014   LDLCALC 61 07/22/2013   CREATININE 1.1 03/10/2014       Medication List       This list is accurate as of: 06/09/14  1:36 PM.  Always use your most  recent med list.               ALPRAZolam 1 MG tablet  Commonly known as:  XANAX  Take 0.5 mg by mouth 4 (four) times daily. For anxiety     aspirin EC 81 MG tablet  Take 81 mg by mouth daily.     CALCIUM PO  Take 600 mg by mouth daily.     DEXILANT 30 MG capsule  Generic drug:  Dexlansoprazole  TAKE 1 CAPSULE BY MOUTH DAILY for heartburn     diclofenac sodium 1 % Gel  Commonly known as:  VOLTAREN  Apply 1 application topically 3 (three) times daily.     ferrous sulfate 325 (65 FE) MG tablet  Take 1 tablet (325 mg total) by mouth 2 (two) times daily with a meal.     furosemide 20 MG tablet  Commonly known as:  LASIX  Take 1 tablet (20 mg total) by mouth daily. For leg swelling     glucosamine-chondroitin 500-400 MG tablet  Take 1 tablet by mouth daily.  HYDROcodone-acetaminophen 5-325 MG per tablet  Commonly known as:  NORCO/VICODIN  Take 0.5-1 tablets by mouth every 6 (six) hours as needed for moderate pain.     loperamide 2 MG tablet  Commonly known as:  IMODIUM A-D  Take 2 mg by mouth 4 (four) times daily as needed for diarrhea or loose stools.     nitroGLYCERIN 0.4 MG SL tablet  Commonly known as:  NITROSTAT  Place 0.4 mg under the tongue every 5 (five) minutes as needed. For chest pain May take up to 3 doses, if pain persists call 911.     nystatin-triamcinolone ointment  Commonly known as:  MYCOLOG  Apply 1 application topically 2 (two) times daily. As needed for itching     pioglitazone 30 MG tablet  Commonly known as:  ACTOS  Take 30 mg by mouth daily. For diabetes     pravastatin 20 MG tablet  Commonly known as:  PRAVACHOL  TAKE 1 TABLET BY MOUTH DAILY.     ramipril 5 MG capsule  Commonly known as:  ALTACE  TAKE 1 CAPSULE BY MOUTH EVERY DAY for blood pressure     rOPINIRole 1 MG tablet  Commonly known as:  REQUIP     traZODone 150 MG tablet  Commonly known as:  DESYREL  Take 150 mg by mouth at bedtime. For sleep     triamcinolone cream  0.1 %  Commonly known as:  KENALOG  Apply 1 application topically daily.     VITAMIN B 12 PO  Take 1 tablet by mouth daily.     VITAMIN C PO  Take 1,000 mg by mouth daily.     VITAMIN E PO  Take 1 tablet by mouth daily.        Allergies:  Allergies  Allergen Reactions  . Amoxicillin-Pot Clavulanate Diarrhea  . Metformin And Related Diarrhea    Past Medical History  Diagnosis Date  . Hypertension   . Coronary artery disease     CABG 1998  . Anxiety   . Diastolic dysfunction   . Hyperlipidemia   . PUD (peptic ulcer disease)   . Microcytic anemia   . Depression   . Diabetes mellitus     type 2, followed by endo.  . Diverticulosis   . Hemorrhoids   . History of heart attack   . Irritable bowel syndrome   . Chronic back pain 03/2012    lumbar DDD with herniation - s/p L S1 transforaminal ESI (10/2012 Dr. Sharlet Salina)  . Varicose veins   . Cystocele   . Hx of echocardiogram     a. Echo 10/13: mild LVH, EF 55-60%, Gr 1 diast dysfn, PASP 32  . Acute deep vein thrombosis (DVT) of distal vein of right lower extremity 12/2013    Acute thrombus of R proximal gastrocnemius vein. Symptomatic provoked distal DVT  . Cellulitis of leg, left 01/16/2014  . PAD (peripheral artery disease) 2013    ABI: R 0.91, L 0.83  . Chronic venous insufficiency     Past Surgical History  Procedure Laterality Date  . Tympanoplasty  1960s    R side  . Coronary artery bypass graft  1998  . Cataract surgery    . Cholecystectomy    . Appendectomy      per pt report  . Colonoscopy  02/2010    extensive diverticulosis throughout, small internal hemorrhoids, rec rpt 5 yrs (Dr. Allyn Kenner)  . Hand surgery    . Leane Call  03/2011  Small basal inferolateral and anterolateral reversible perfusion defect suggests ischemia.  EF was normal.   old infarct, no new ischemia  . Toe surgery    . Abdominal hysterectomy  1975    TAH,BSO  . Vault prolapse a & p repair  2009    Dr [facility]  hospital  . Dexa scan  09/2011    femur -0.8, forearm 0.6 => normal  . Esi  2014    L S1 transforaminal ESI x2 (Chasnis) without improvement  . Abi  2013    R 0.91, L 0.83  . Endovenous ablation saphenous vein w/ laser Left 03/2014    Kellie Simmering    Family History  Problem Relation Age of Onset  . Hypertension Mother   . Stroke Mother   . Diabetes Father   . Diabetes Sister   . Diabetes Brother   . Colon cancer Neg Hx     Social History:  reports that she quit smoking about 35 years ago. She has never used smokeless tobacco. She reports that she does not drink alcohol or use illicit drugs.  Review of Systems:  Leg ulcer on the right side: she is still on treatment and was started on antibiotics in the previous visit. Currently with wound center  She has some pain in her lower legs. She takes hydrocodone with some relief.   Pain is not worse on walking. Not  Taking gabapentin at this time      She was given Requip for restless legs on the last visit with relief  HYPERTENSION:  she is having good control usually with ramipril   no micro-albuminuria and has normal renal function and potassium with this.   HYPERLIPIDEMIA: The lipid abnormality consists of elevated LDL treated with pravastatin low-dose.  Lab Results  Component Value Date   CHOL 123 07/22/2013   HDL 44.00 07/22/2013   LDLCALC 61 07/22/2013   TRIG 90.0 07/22/2013   CHOLHDL 3 07/22/2013    Low back pain:  Has persistent pain and has difficulty bending down with this      Examination:   BP 126/70 mmHg  Pulse 83  Temp(Src) 98.5 F (36.9 C)  Resp 14  Ht 4\' 9"  (1.448 m)  Wt 149 lb 9.6 oz (67.858 kg)  BMI 32.36 kg/m2  SpO2 96%  Body mass index is 32.36 kg/(m^2).   No edema on  The feet  foot exam shows normal sensation but absent pedal pulses.  No skin lesions. Distal  Lower extremities are not cool or cyanotic  unable to examine ulcer as it is bandaged  ASSESSMENT/ PLAN:   The patient's diabetes  control appears to be fairly good at home   She has generally been well controlled with Actos alone , now taking 30 mg with similar blood sugars A1c needs to be checked  Again has difficulty losing weight Currently not  Able to do any physical activity because of leg ulcer She does need to check blood sugars at various times of the day and she will call if they are unusually high She will bring her monitor for review on the next visit which she has not done despite repeated reminders     discussed timing of glucose monitoring, blood sugar targets, need for balanced meals with some protein at each meal  LEG ulcer: She has difficulty healing and because of her  Absent pedal pulses recommend peripheral arterial Dopplers. She is reluctant to do this and she will discuss this further with her  wound care specialist   HYPERTENSION: This is mild and well controlled without orthostatic symptoms.  Needs follow-up renal function   PERIPHERAL neuropathy: Symptoms are mild at this time and she is not needing gabapentin  Counseling time over 50% of today's 25 minute visit  Quantrell Splitt 06/09/2014, 1:36 PM     addendum: A1c 7%, slightly higher but adequate for her age  Office Visit on 06/09/2014  Component Date Value Ref Range Status  . Sodium 06/09/2014 137  135 - 145 mEq/L Final  . Potassium 06/09/2014 4.2  3.5 - 5.1 mEq/L Final  . Chloride 06/09/2014 104  96 - 112 mEq/L Final  . CO2 06/09/2014 27  19 - 32 mEq/L Final  . Glucose, Bld 06/09/2014 78  70 - 99 mg/dL Final  . BUN 06/09/2014 22  6 - 23 mg/dL Final  . Creatinine, Ser 06/09/2014 0.86  0.40 - 1.20 mg/dL Final  . Total Bilirubin 06/09/2014 0.2  0.2 - 1.2 mg/dL Final  . Alkaline Phosphatase 06/09/2014 98  39 - 117 U/L Final  . AST 06/09/2014 20  0 - 37 U/L Final  . ALT 06/09/2014 14  0 - 35 U/L Final  . Total Protein 06/09/2014 7.0  6.0 - 8.3 g/dL Final  . Albumin 06/09/2014 3.4* 3.5 - 5.2 g/dL Final  . Calcium 06/09/2014 9.3  8.4 - 10.5  mg/dL Final  . GFR 06/09/2014 66.65  >60.00 mL/min Final  . Hgb A1c MFr Bld 06/09/2014 7.0* 4.6 - 6.5 % Final   Glycemic Control Guidelines for People with Diabetes:Non Diabetic:  <6%Goal of Therapy: <7%Additional Action Suggested:  >8%

## 2014-06-13 DIAGNOSIS — Z9119 Patient's noncompliance with other medical treatment and regimen: Secondary | ICD-10-CM | POA: Diagnosis not present

## 2014-06-13 DIAGNOSIS — L97311 Non-pressure chronic ulcer of right ankle limited to breakdown of skin: Secondary | ICD-10-CM | POA: Diagnosis not present

## 2014-06-13 DIAGNOSIS — I87331 Chronic venous hypertension (idiopathic) with ulcer and inflammation of right lower extremity: Secondary | ICD-10-CM | POA: Diagnosis not present

## 2014-06-13 DIAGNOSIS — E119 Type 2 diabetes mellitus without complications: Secondary | ICD-10-CM | POA: Diagnosis not present

## 2014-06-16 ENCOUNTER — Telehealth: Payer: Self-pay

## 2014-06-16 MED ORDER — HYDROCODONE-ACETAMINOPHEN 5-325 MG PO TABS: 0.5000 | ORAL_TABLET | Freq: Four times a day (QID) | ORAL | Status: DC | PRN

## 2014-06-16 NOTE — Telephone Encounter (Signed)
Printed and in Kim's box 

## 2014-06-16 NOTE — Telephone Encounter (Signed)
Pt left v/m; pt thinks she has thrown away hydrocodone apap prescription that she could get filled after 06/17/14; pt got rx at 06/05/14 office visit;pt cannot find rx anywhere. Pt wants to know if can get new rx for hydrocodone apap. Pt request cb.

## 2014-06-17 NOTE — Telephone Encounter (Signed)
Patient notified and Rx placed up front for pick up. 

## 2014-06-20 DIAGNOSIS — Z9119 Patient's noncompliance with other medical treatment and regimen: Secondary | ICD-10-CM | POA: Diagnosis not present

## 2014-06-20 DIAGNOSIS — L97311 Non-pressure chronic ulcer of right ankle limited to breakdown of skin: Secondary | ICD-10-CM | POA: Diagnosis not present

## 2014-06-20 DIAGNOSIS — E119 Type 2 diabetes mellitus without complications: Secondary | ICD-10-CM | POA: Diagnosis not present

## 2014-06-20 DIAGNOSIS — I87331 Chronic venous hypertension (idiopathic) with ulcer and inflammation of right lower extremity: Secondary | ICD-10-CM | POA: Diagnosis not present

## 2014-06-20 LAB — GLUCOSE, CAPILLARY: Glucose-Capillary: 76 mg/dL (ref 70–99)

## 2014-06-22 ENCOUNTER — Other Ambulatory Visit: Payer: Self-pay | Admitting: Family Medicine

## 2014-06-27 DIAGNOSIS — L97311 Non-pressure chronic ulcer of right ankle limited to breakdown of skin: Secondary | ICD-10-CM | POA: Diagnosis not present

## 2014-06-27 DIAGNOSIS — E119 Type 2 diabetes mellitus without complications: Secondary | ICD-10-CM | POA: Diagnosis not present

## 2014-06-27 DIAGNOSIS — Z9119 Patient's noncompliance with other medical treatment and regimen: Secondary | ICD-10-CM | POA: Diagnosis not present

## 2014-06-27 DIAGNOSIS — I87331 Chronic venous hypertension (idiopathic) with ulcer and inflammation of right lower extremity: Secondary | ICD-10-CM | POA: Diagnosis not present

## 2014-07-01 ENCOUNTER — Other Ambulatory Visit: Payer: Self-pay | Admitting: Family Medicine

## 2014-07-07 ENCOUNTER — Encounter (HOSPITAL_BASED_OUTPATIENT_CLINIC_OR_DEPARTMENT_OTHER): Payer: Medicare Other | Attending: Plastic Surgery

## 2014-07-07 DIAGNOSIS — I87331 Chronic venous hypertension (idiopathic) with ulcer and inflammation of right lower extremity: Secondary | ICD-10-CM | POA: Insufficient documentation

## 2014-07-07 DIAGNOSIS — L97811 Non-pressure chronic ulcer of other part of right lower leg limited to breakdown of skin: Secondary | ICD-10-CM | POA: Insufficient documentation

## 2014-07-07 DIAGNOSIS — E118 Type 2 diabetes mellitus with unspecified complications: Secondary | ICD-10-CM | POA: Diagnosis not present

## 2014-07-11 ENCOUNTER — Other Ambulatory Visit: Payer: Self-pay | Admitting: Family Medicine

## 2014-07-18 DIAGNOSIS — L97811 Non-pressure chronic ulcer of other part of right lower leg limited to breakdown of skin: Secondary | ICD-10-CM | POA: Diagnosis not present

## 2014-07-18 DIAGNOSIS — E118 Type 2 diabetes mellitus with unspecified complications: Secondary | ICD-10-CM | POA: Diagnosis not present

## 2014-07-18 DIAGNOSIS — I87331 Chronic venous hypertension (idiopathic) with ulcer and inflammation of right lower extremity: Secondary | ICD-10-CM | POA: Diagnosis not present

## 2014-07-23 DIAGNOSIS — I87331 Chronic venous hypertension (idiopathic) with ulcer and inflammation of right lower extremity: Secondary | ICD-10-CM | POA: Diagnosis not present

## 2014-07-23 DIAGNOSIS — E118 Type 2 diabetes mellitus with unspecified complications: Secondary | ICD-10-CM | POA: Diagnosis not present

## 2014-07-23 DIAGNOSIS — L97811 Non-pressure chronic ulcer of other part of right lower leg limited to breakdown of skin: Secondary | ICD-10-CM | POA: Diagnosis not present

## 2014-07-28 ENCOUNTER — Other Ambulatory Visit: Payer: Self-pay

## 2014-07-28 MED ORDER — HYDROCODONE-ACETAMINOPHEN 5-325 MG PO TABS: 0.5000 | ORAL_TABLET | Freq: Four times a day (QID) | ORAL | Status: DC | PRN

## 2014-07-28 NOTE — Telephone Encounter (Signed)
Pt left v/m requesting rx hydrocodone apap. Call when ready for pick up. Pt last seen 06/05/2014.

## 2014-07-28 NOTE — Telephone Encounter (Signed)
Printed and in Kim's box 

## 2014-07-28 NOTE — Telephone Encounter (Signed)
Patient notified and Rx placed up front for pick up. 

## 2014-07-30 DIAGNOSIS — L97811 Non-pressure chronic ulcer of other part of right lower leg limited to breakdown of skin: Secondary | ICD-10-CM | POA: Diagnosis not present

## 2014-07-30 DIAGNOSIS — E118 Type 2 diabetes mellitus with unspecified complications: Secondary | ICD-10-CM | POA: Diagnosis not present

## 2014-07-30 DIAGNOSIS — L97311 Non-pressure chronic ulcer of right ankle limited to breakdown of skin: Secondary | ICD-10-CM | POA: Diagnosis not present

## 2014-07-30 DIAGNOSIS — E11621 Type 2 diabetes mellitus with foot ulcer: Secondary | ICD-10-CM | POA: Diagnosis not present

## 2014-07-30 DIAGNOSIS — I87311 Chronic venous hypertension (idiopathic) with ulcer of right lower extremity: Secondary | ICD-10-CM | POA: Diagnosis not present

## 2014-07-30 DIAGNOSIS — I87331 Chronic venous hypertension (idiopathic) with ulcer and inflammation of right lower extremity: Secondary | ICD-10-CM | POA: Diagnosis not present

## 2014-08-06 ENCOUNTER — Encounter (HOSPITAL_BASED_OUTPATIENT_CLINIC_OR_DEPARTMENT_OTHER): Payer: Medicare Other | Attending: Surgery

## 2014-08-06 DIAGNOSIS — L97811 Non-pressure chronic ulcer of other part of right lower leg limited to breakdown of skin: Secondary | ICD-10-CM | POA: Insufficient documentation

## 2014-08-06 DIAGNOSIS — I87331 Chronic venous hypertension (idiopathic) with ulcer and inflammation of right lower extremity: Secondary | ICD-10-CM | POA: Diagnosis not present

## 2014-08-06 DIAGNOSIS — L97819 Non-pressure chronic ulcer of other part of right lower leg with unspecified severity: Secondary | ICD-10-CM | POA: Diagnosis not present

## 2014-08-06 DIAGNOSIS — E1169 Type 2 diabetes mellitus with other specified complication: Secondary | ICD-10-CM | POA: Insufficient documentation

## 2014-08-06 DIAGNOSIS — I872 Venous insufficiency (chronic) (peripheral): Secondary | ICD-10-CM | POA: Diagnosis not present

## 2014-08-15 ENCOUNTER — Encounter: Payer: Medicare Other | Attending: Surgery | Admitting: Surgery

## 2014-08-15 DIAGNOSIS — L03115 Cellulitis of right lower limb: Secondary | ICD-10-CM | POA: Diagnosis not present

## 2014-08-15 DIAGNOSIS — I87331 Chronic venous hypertension (idiopathic) with ulcer and inflammation of right lower extremity: Secondary | ICD-10-CM | POA: Insufficient documentation

## 2014-08-15 DIAGNOSIS — E11622 Type 2 diabetes mellitus with other skin ulcer: Secondary | ICD-10-CM | POA: Diagnosis not present

## 2014-08-15 DIAGNOSIS — R609 Edema, unspecified: Secondary | ICD-10-CM | POA: Insufficient documentation

## 2014-08-15 DIAGNOSIS — L97911 Non-pressure chronic ulcer of unspecified part of right lower leg limited to breakdown of skin: Secondary | ICD-10-CM | POA: Insufficient documentation

## 2014-08-15 DIAGNOSIS — L97821 Non-pressure chronic ulcer of other part of left lower leg limited to breakdown of skin: Secondary | ICD-10-CM | POA: Diagnosis not present

## 2014-08-15 DIAGNOSIS — I87332 Chronic venous hypertension (idiopathic) with ulcer and inflammation of left lower extremity: Secondary | ICD-10-CM | POA: Diagnosis not present

## 2014-08-15 DIAGNOSIS — E11610 Type 2 diabetes mellitus with diabetic neuropathic arthropathy: Secondary | ICD-10-CM | POA: Diagnosis not present

## 2014-08-15 NOTE — Progress Notes (Signed)
TEMITAYO, KHOSLA ([id number]) Visit Report for 08/15/2014 Abuse/Suicide Risk Screen Details Patient Name: Siller, Griselle C. Date of Service: 08/15/2014 1:15 PM Medical Record Number: [id number] Patient Account Number: [id number] Date of Birth/Sex: November 18, 1927 (81 y.o. Female) Treating RN: Cornell Barman Primary Care Physician: Ria Bush Other Clinician: Referring Physician: Ria Bush Treating Physician/Extender: Frann Rider in Treatment: 0 Abuse/Suicide Risk Screen Items Answer ABUSE/SUICIDE RISK SCREEN: Has anyone close to you tried to hurt or harm you recentlyo No Do you feel uncomfortable with anyone in your familyo No Has anyone forced you do things that you didnot want to doo No Do you have any thoughts of harming yourselfo No Patient displays signs or symptoms of abuse and/or neglect. No Electronic Signature(s) Signed: 08/15/2014 3:41:50 PM By: Gretta Cool, RN, BSN, Kim RN, BSN Entered By: Gretta Cool, RN, BSN, Kim on 08/15/2014 13:31:28 [place], [name] ([id number]) -------------------------------------------------------------------------------- Activities of Daily Living Details Patient Name: Cavallaro, Gwenevere C. Date of Service: 08/15/2014 1:15 PM Medical Record Number: [id number] Patient Account Number: [id number] Date of Birth/Sex: 1928-02-27 (81 y.o. Female) Treating RN: Cornell Barman Primary Care Physician: Ria Bush Other Clinician: Referring Physician: Ria Bush Treating Physician/Extender: Frann Rider in Treatment: 0 Activities of Daily Living Items Answer Activities of Daily Living (Please select one for each item) Drive Automobile Completely Able Take Medications Completely Able Use Telephone Completely Able Care for Appearance Completely Able Use Toilet Completely Able Bath / Shower Completely Able Dress Self Completely Able Feed Self Completely Able Walk Completely Able Get In / Out Bed Completely Able Housework Completely  Able Prepare Meals Completely Able Handle Money Completely Able Shop for Self Completely Able Electronic Signature(s) Signed: 08/15/2014 3:41:50 PM By: Gretta Cool, RN, BSN, Kim RN, BSN Entered By: Gretta Cool, RN, BSN, Kim on 08/15/2014 13:31:39 [place], [name] ([id number]) -------------------------------------------------------------------------------- Education Assessment Details Patient Name: Foos, Nyela C. Date of Service: 08/15/2014 1:15 PM Medical Record Number: [id number] Patient Account Number: [id number] Date of Birth/Sex: 01-26-1928 (81 y.o. Female) Treating RN: Cornell Barman Primary Care Physician: Ria Bush Other Clinician: Referring Physician: Ria Bush Treating Physician/Extender: Frann Rider in Treatment: 0 Learning Preferences/Education Level/Primary Language Learning Preference: Explanation Highest Education Level: High School Preferred Language: English Cognitive Barrier Assessment/Beliefs Language Barrier: No Translator Needed: No Memory Deficit: No Emotional Barrier: No Cultural/Religious Beliefs Affecting Medical No Care: Physical Barrier Assessment Impaired Vision: No Impaired Hearing: No Knowledge/Comprehension Assessment Knowledge Level: High Comprehension Level: High Ability to understand written High instructions: Ability to understand verbal High instructions: Motivation Assessment Anxiety Level: Calm Cooperation: Cooperative Education Importance: Acknowledges Need Interest in Health Problems: Asks Questions Perception: Coherent Willingness to Engage in Self- High Management Activities: Readiness to Engage in Self- High Management Activities: Electronic Signature(s) Signed: 08/15/2014 3:41:50 PM By: Gretta Cool, RN, BSN, Kim RN, BSN [place], [place] ([id number]) Entered By: Gretta Cool, RN, BSN, Kim on 08/15/2014 13:32:15 [place], [name]  ([id number]) -------------------------------------------------------------------------------- Fall Risk Assessment Details Patient Name: Kosloski, Elliotte C. Date of Service: 08/15/2014 1:15 PM Medical Record Number: [id number] Patient Account Number: [id number] Date of Birth/Sex: 1927-08-30 (81 y.o. Female) Treating RN: Cornell Barman Primary Care Physician: Ria Bush Other Clinician: Referring Physician: Ria Bush Treating Physician/Extender: Frann Rider in Treatment: 0 Fall Risk Assessment Items FALL RISK ASSESSMENT: History of falling - immediate or within 3 months 0 No Secondary diagnosis 0 No Ambulatory aid None/bed rest/wheelchair/nurse 0 No Crutches/cane/walker 15 Yes Furniture 0 No IV Access/Saline Lock 0 No Gait/Training Normal/bed rest/immobile 0 Yes Weak 0 No Impaired 0 No Mental Status Oriented to  own ability 0 Yes Electronic Signature(s) Signed: 08/15/2014 3:41:50 PM By: Gretta Cool, RN, BSN, Kim RN, BSN Entered By: Gretta Cool, RN, BSN, Kim on 08/15/2014 13:32:56 Youse, [name] ([id number]) -------------------------------------------------------------------------------- Nutrition Risk Assessment Details Patient Name: Rajan, Cythina C. Date of Service: 08/15/2014 1:15 PM Medical Record Number: [id number] Patient Account Number: [id number] Date of Birth/Sex: 08-20-1927 (81 y.o. Female) Treating RN: Cornell Barman Primary Care Physician: Ria Bush Other Clinician: Referring Physician: Ria Bush Treating Physician/Extender: Frann Rider in Treatment: 0 Height (in): 58 Weight (lbs): 151 Body Mass Index (BMI): 31.6 Nutrition Risk Assessment Items NUTRITION RISK SCREEN: I have an illness or condition that made me change the kind and/or 0 No amount of food I eat I eat fewer than two meals per day 3 Yes I eat few fruits and vegetables, or milk products 0 No I have three or more drinks of beer, liquor or wine almost every day 0 No I have  tooth or mouth problems that make it hard for me to eat 0 No I don't always have enough money to buy the food I need 0 No I eat alone most of the time 0 No I take three or more different prescribed or over-the-counter drugs a 1 Yes day Without wanting to, I have lost or gained 10 pounds in the last six 0 No months I am not always physically able to shop, cook and/or feed myself 0 No Nutrition Protocols Good Risk Protocol Provide education on elevated blood sugars Moderate Risk Protocol 0 and impact on wound healing, as applicable Electronic Signature(s) Signed: 08/15/2014 3:41:50 PM By: Gretta Cool, RN, BSN, Kim RN, BSN Entered By: Gretta Cool, RN, BSN, Kim on 08/15/2014 13:33:31

## 2014-08-18 ENCOUNTER — Ambulatory Visit: Payer: Self-pay | Admitting: Surgery

## 2014-08-18 NOTE — Progress Notes (Addendum)
[name], [name] ([id number]) Visit Report for 08/15/2014 Allergy List Details Patient Name: [name], [name] Date of Service: 08/15/2014 1:15 PM Medical Record Number: [id number] Patient Account Number: [id number] Date of Birth/Sex: [birth year] (81 y.o. Female) Treating RN: Cornell Barman Primary Care Physician: Ria Bush Other Clinician: Referring Physician: Ria Bush Treating Physician/Extender: Frann Rider in Treatment: 0 Allergies Active Allergies No Known Allergies Allergy Notes Electronic Signature(s) Signed: 08/15/2014 3:41:50 PM By: Gretta Cool, RN, BSN, Kim RN, BSN Entered By: Gretta Cool, RN, BSN, Kim on 08/15/2014 13:20:59 [name], [name] ([id number]) -------------------------------------------------------------------------------- Arrival Information Details Patient Name: Pfahler, Shandon C. Date of Service: 08/15/2014 1:15 PM Medical Record Number: [id number] Patient Account Number: [id number] Date of Birth/Sex: 1927/07/08 (81 y.o. Female) Treating RN: Cornell Barman Primary Care Physician: Ria Bush Other Clinician: Referring Physician: Ria Bush Treating Physician/Extender: Frann Rider in Treatment: 0 Visit Information Patient Arrived: Charlyn Minerva Time: 10:04 Accompanied By: son, in lobby Transfer Assistance: None Patient Identification Verified: Yes Secondary Verification Process Yes Completed: Patient Has Alerts: Yes Patient Alerts: Patient on Blood Thinner Type II Diabetic 81 MG aspirin 08/15/14 ABI: (L) (R) 1.01 Electronic Signature(s) Signed: 09/10/2014 3:10:54 PM By: Gretta Cool RN, BSN, Kim RN, BSN Previous Signature: 08/15/2014 3:41:50 PM Version By: Gretta Cool RN, BSN, Kim RN, BSN Entered By: Gretta Cool, RN, BSN, Kim on 09/10/2014 15:10:53 [name], [name] ([id number]) -------------------------------------------------------------------------------- Encounter Discharge Information Details Patient Name: Conkey, Skylene C. Date of  Service: 08/15/2014 1:15 PM Medical Record Number: [id number] Patient Account Number: [id number] Date of Birth/Sex: 10-27-1927 (81 y.o. Female) Treating RN: Primary Care Physician: Ria Bush Other Clinician: Referring Physician: Ria Bush Treating Physician/Extender: Frann Rider in Treatment: 0 Encounter Discharge Information Items Schedule Follow-up Appointment: No Medication Reconciliation completed No and provided to Patient/Care [place]: Provided on Clinical Summary of Care: 08/15/2014 Form Type Recipient Paper Patient LL Electronic Signature(s) Signed: 08/15/2014 2:23:23 PM By: Ruthine Dose Entered By: Ruthine Dose on 08/15/2014 14:23:23 [name], [name] ([id number]) -------------------------------------------------------------------------------- Lower Extremity Assessment Details Patient Name: Goodhart, Brettney C. Date of Service: 08/15/2014 1:15 PM Medical Record Number: [id number] Patient Account Number: [id number] Date of Birth/Sex: February 18, 1928 (81 y.o. Female) Treating RN: Cornell Barman Primary Care Physician: Ria Bush Other Clinician: Referring Physician: Ria Bush Treating Physician/Extender: Frann Rider in Treatment: 0 Edema Assessment Assessed: [Left: No] [Right: No] E[Left: dema] [Right: :] Calf Left: Right: Point of Measurement: 27 cm From Medial Instep cm 31.5 cm Ankle Left: Right: Point of Measurement: 10 cm From Medial Instep cm 21.4 cm Vascular Assessment Claudication: Claudication Assessment [Right:None] Pulses: Posterior Tibial Palpable: [Right:No] Doppler: [Right:Monophasic] Dorsalis Pedis Palpable: [Right:No] Doppler: [Right:Monophasic] Extremity colors, hair growth, and conditions: Extremity Color: [Right:Mottled] Hair Growth on Extremity: [Right:No] Temperature of Extremity: [Right:Warm] Capillary Refill: [Right:< 3 seconds] Blood Pressure: Brachial: [Right:119] Dorsalis Pedis: [Left:Dorsalis  Pedis: [id number] Ankle: Posterior Tibial: [Left:Posterior Tibial:] [Right:1.01] Toe Nail Assessment Left: Right: Thick: Yes Discolored: Yes [name], [name] ([id number]) Deformed: No Improper Length and Hygiene: No Electronic Signature(s) Signed: 08/15/2014 3:41:50 PM By: Gretta Cool, RN, BSN, Kim RN, BSN Entered By: Gretta Cool, RN, BSN, Kim on 08/15/2014 13:18:47 [name], [name] ([id number]) -------------------------------------------------------------------------------- Multi Wound Chart Details Patient Name: Keay, Kandas C. Date of Service: 08/15/2014 1:15 PM Medical Record Number: [id number] Patient Account Number: [id number] Date of Birth/Sex: 21-Jan-1928 (81 y.o. Female) Treating RN: Cornell Barman Primary Care Physician: Ria Bush Other Clinician: Referring Physician: Ria Bush Treating Physician/Extender: Frann Rider in Treatment: 0 Vital Signs Height(in): 58 Pulse(bpm): 76 Weight(lbs): 151 Blood Pressure 119/63 (mmHg): Body Mass  Index(BMI): 32 Temperature(F): 98.2 Respiratory Rate 18 (breaths/min): Photos: [1:No Photos] [N/A:N/A] Wound Location: [1:Left Lower Leg - Medial] [N/A:N/A] Wounding Event: [1:Gradually Appeared] [N/A:N/A] Primary Etiology: [1:Venous Leg Ulcer] [N/A:N/A] Date Acquired: [1:04/04/2014] [N/A:N/A] Weeks of Treatment: [1:0] [N/A:N/A] Wound Status: [1:Open] [N/A:N/A] Measurements L x W x D 5x1.5x0.3 [N/A:N/A] (cm) Area (cm) : [1:5.89] [N/A:N/A] Volume (cm) : [1:1.767] [N/A:N/A] % Reduction in Area: [1:0.00%] [N/A:N/A] % Reduction in Volume: 0.00% [N/A:N/A] Classification: [1:Full Thickness Without Exposed Support Structures] [N/A:N/A] Exudate Amount: [1:Small] [N/A:N/A] Exudate Type: [1:Serosanguineous] [N/A:N/A] Exudate Color: [1:red, brown] [N/A:N/A] Wound Margin: [1:Flat and Intact] [N/A:N/A] Granulation Amount: [1:Small (1-33%)] [N/A:N/A] Granulation Quality: [1:Pink] [N/A:N/A] Necrotic Amount: [1:Large (67-100%)]  [N/A:N/A] Exposed Structures: [1:Fascia: No Fat: No Tendon: No Muscle: No Joint: No Bone: No] [N/A:N/A] Limited to Skin Breakdown Epithelialization: Small (1-33%) N/A N/A Periwound Skin Texture: No Abnormalities Noted N/A N/A Periwound Skin Moist: Yes N/A N/A Moisture: Dry/Scaly: Yes Periwound Skin Color: Erythema: Yes N/A N/A Tenderness on No N/A N/A Palpation: Wound Preparation: Ulcer Cleansing: N/A N/A Rinsed/Irrigated with Saline Topical Anesthetic Applied: Other: lidociane 4% Treatment Notes Electronic Signature(s) Signed: 08/15/2014 3:41:50 PM By: Gretta Cool, RN, BSN, Kim RN, BSN Entered By: Gretta Cool, RN, BSN, Kim on 08/15/2014 13:36:16 [name], [name] ([id number]) -------------------------------------------------------------------------------- [place] Details Patient Name: [name], [name] Date of Service: 08/15/2014 1:15 PM Medical Record Number: [id number] Patient Account Number: [id number] Date of Birth/Sex: [birth year] (81 y.o. Female) Treating RN: Cornell Barman Primary Care Physician: Ria Bush Other Clinician: Referring Physician: Ria Bush Treating Physician/Extender: Frann Rider in Treatment: 0 Active Inactive Abuse / Safety / Falls / Self Care Management Nursing Diagnoses: Potential for falls Goals: Patient will remain injury free Date Initiated: 08/15/2014 Goal Status: Active Interventions: Assess fall risk on admission and as needed Notes: Nutrition Nursing Diagnoses: Impaired glucose control: actual or potential Goals: Patient/caregiver verbalizes understanding of need to maintain therapeutic glucose control per primary care physician Date Initiated: 08/15/2014 Goal Status: Active Interventions: Assess patient nutrition upon admission and as needed per policy Notes: Orientation to the Wound Care Program Nursing Diagnoses: Knowledge deficit related to the wound healing center  program Goals: Patient/caregiver will verbalize understanding of the Edgerton, Shakiara C. ([id number]) Date Initiated: 08/15/2014 Goal Status: Active Interventions: Provide education on orientation to the wound center Notes: Venous Leg Ulcer Nursing Diagnoses: Actual venous Insuffiency (use after diagnosis is confirmed) Goals: Patient will maintain optimal edema control Date Initiated: 08/15/2014 Goal Status: Active Interventions: Compression as ordered Treatment Activities: Test ordered outside of clinic : 08/15/2014 Notes: Wound/Skin Impairment Nursing Diagnoses: Impaired tissue integrity Goals: Patient/caregiver will verbalize understanding of skin care regimen Date Initiated: 08/15/2014 Goal Status: Active Interventions: Assess patient/caregiver ability to obtain necessary supplies Notes: Electronic Signature(s) Signed: 08/15/2014 3:41:50 PM By: Gretta Cool, RN, BSN, Kim RN, BSN Entered By: Gretta Cool, RN, BSN, Kim on 08/15/2014 13:36:05 [name], [name] ([id number]) -------------------------------------------------------------------------------- Pain Assessment Details Patient Name: [name], [name] Date of Service: 08/15/2014 1:15 PM Medical Record Number: [id number] Patient Account Number: [id number] Date of Birth/Sex: 1927-11-06 (81 y.o. Female) Treating RN: Cornell Barman Primary Care Physician: Ria Bush Other Clinician: Referring Physician: Ria Bush Treating Physician/Extender: Frann Rider in Treatment: 0 Active Problems Location of Pain Severity and Description of Pain Patient Has [name] Yes Site Locations Pain Location: Pain in Ulcers With Dressing Change: Yes Duration of the Pain. Constant / Intermittento Intermittent Rate the pain. Current Pain Level: 5 Character of Pain Describe the Pain: Burning, Throbbing Pain Management and Medication Current  Pain Management: Medication: No Cold Application: No Rest:  No Massage: No Activity: No T.E.N.S.: No Heat Application: No Leg drop or elevation: No Is the Current Pain Management Inadequate Adequate: Electronic Signature(s) Signed: 08/15/2014 3:41:50 PM By: Gretta Cool, RN, BSN, Kim RN, BSN Entered By: Gretta Cool, RN, BSN, Kim on 08/15/2014 13:34:08 [name], [name] ([id number]) -------------------------------------------------------------------------------- Wound Assessment Details Patient Name: [name], [name] C. Date of Service: 08/15/2014 1:15 PM Medical Record Number: [id number] Patient Account Number: [id number] Date of Birth/Sex: 05-14-1927 (81 y.o. Female) Treating RN: Cornell Barman Primary Care Physician: Ria Bush Other Clinician: Referring Physician: Ria Bush Treating Physician/Extender: Frann Rider in Treatment: 0 Wound Status Wound Number: 1 Primary Etiology: Venous Leg Ulcer Wound Location: Left Lower Leg - Medial Wound Status: Open Wounding Event: Gradually Appeared Date Acquired: 04/04/2014 Weeks Of Treatment: 0 Clustered Wound: No Photos Photo Uploaded By: Gretta Cool, RN, BSN, Kim on 08/15/2014 14:53:58 Wound Measurements Length: (cm) 5 Width: (cm) 1.5 Depth: (cm) 0.3 Area: (cm) 5.89 Volume: (cm) 1.767 % Reduction in Area: 0% % Reduction in Volume: 0% Epithelialization: Small (1-33%) Wound Description Full Thickness Without Exposed Classification: Support Structures Wound Margin: Flat and Intact Exudate Small Amount: Exudate Type: Serosanguineous Exudate Color: red, brown Foul Odor After Cleansing: No Wound Bed Granulation Amount: Small (1-33%) Exposed Structure Granulation Quality: Pink Fascia Exposed: No Necrotic Amount: Large (67-100%) Fat Layer Exposed: No Corbello, Zavannah C. ([id number]) Necrotic Quality: Adherent Slough Tendon Exposed: No Muscle Exposed: No Joint Exposed: No Bone Exposed: No Limited to Skin Breakdown Periwound Skin Texture Texture Color No Abnormalities Noted:  No No Abnormalities Noted: No Erythema: Yes Moisture No Abnormalities Noted: No Dry / Scaly: Yes Moist: Yes Wound Preparation Ulcer Cleansing: Rinsed/Irrigated with Saline Topical Anesthetic Applied: Other: lidociane 4%, Electronic Signature(s) Signed: 08/15/2014 3:41:50 PM By: Gretta Cool, RN, BSN, Kim RN, BSN Entered By: Gretta Cool, RN, BSN, Kim on 08/15/2014 13:20:44 Finck, [name] ([id number]) -------------------------------------------------------------------------------- Vitals Details Patient Name: Schoenberger, Cassady C. Date of Service: 08/15/2014 1:15 PM Medical Record Number: [id number] Patient Account Number: [id number] Date of Birth/Sex: Mar 16, 1928 (81 y.o. Female) Treating RN: Cornell Barman Primary Care Physician: Ria Bush Other Clinician: Referring Physician: Ria Bush Treating Physician/Extender: Frann Rider in Treatment: 0 Vital Signs Time Taken: 13:05 Temperature (F): 98.2 Height (in): 58 Pulse (bpm): 76 Source: Stated Respiratory Rate (breaths/min): 18 Weight (lbs): 151 Blood Pressure (mmHg): 119/63 Source: Stated Reference Range: 80 - 120 mg / dl Body Mass Index (BMI): 31.6 Electronic Signature(s) Signed: 08/15/2014 3:41:50 PM By: Gretta Cool, RN, BSN, Kim RN, BSN Entered By: Gretta Cool, RN, BSN, Kim on 08/15/2014 13:06:40

## 2014-08-18 NOTE — Progress Notes (Signed)
[name], [name] ([id number]) Visit Report for 08/15/2014 Chief Complaint Document Details Patient Name: [name], [name] Date of Service: 08/15/2014 1:15 PM Medical Record Number: [id number] Patient Account Number: [id number] Date of Birth/Sex: 02/03/1928 (81 y.o. Female) Treating RN: Primary Care Physician: Ria Bush Other Clinician: Referring Physician: Ria Bush Treating Physician/Extender: Frann Rider in Treatment: 0 Information Obtained from: Patient Chief Complaint Patient presents for treatment of an open ulcer due to venous insufficiency. 81 year old patient who is known to me from the [facility] wound care center now comes in follow up here for a chronic venous ulceration of her right lower extremity which she's had for about 6 months. Electronic Signature(s) Signed: 08/18/2014 12:30:09 PM By: Christin Fudge MD, FACS Entered By: Christin Fudge on 08/15/2014 13:52:58 [name], [name] -------------------------------------------------------------------------------- Debridement Details Patient Name: [name], [name] Date of Service: 08/15/2014 1:15 PM Medical Record Number: [id number] Patient Account Number: [id number] Date of Birth/Sex: [birth year] (81 y.o. Female) Treating RN: Primary Care Physician: Ria Bush Other Clinician: Referring Physician: Ria Bush Treating Physician/Extender: Frann Rider in Treatment: 0 Debridement Performed for Wound #1 Left,Medial Lower Leg Assessment: Performed By: Physician Pat Patrick., MD Debridement: Debridement Pre-procedure Yes Verification/Time Out Taken: Start Time: 01:40 Pain Control: Lidocaine 5% topical ointment Level: Skin/Subcutaneous Tissue Total Area Debrided (L x 3 (cm) x 2 (cm) = 6 (cm) W): Tissue and other Viable, Non-Viable, Eschar, Fibrin/Slough, Subcutaneous material debrided: Instrument: Curette Bleeding: Minimum Hemostasis Achieved: Pressure End  Time: 01:46 Procedural Pain: 2 Post Procedural Pain: 0 Response to Treatment: Procedure was tolerated well Post Debridement Measurements of Total Wound Length: (cm) 5 Width: (cm) 1.5 Depth: (cm) 0.3 Volume: (cm) 1.767 Electronic Signature(s) Signed: 08/18/2014 12:30:09 PM By: Christin Fudge MD, FACS Entered By: Christin Fudge on 08/15/2014 13:52:05 [name], [name] -------------------------------------------------------------------------------- HPI Details Patient Name: [name], [name] Date of Service: 08/15/2014 1:15 PM Medical Record Number: [id number] Patient Account Number: [id number] Date of Birth/Sex: 02-23-1928 (81 y.o. Female) Treating RN: Primary Care Physician: Ria Bush Other Clinician: Referring Physician: Ria Bush Treating Physician/Extender: Frann Rider in Treatment: 0 History of Present Illness Location: ulcer in the right lower extremity for several months Quality: Patient reports experiencing a dull pain to affected area(s). Severity: Patient states wound (s) are getting better. Duration: Patient has had the wound for > 3 months prior to seeking treatment at the wound center Timing: Pain in wound is Intermittent (comes and goes Context: The wound appeared gradually over time Modifying Factors: Consults to this date include: vascular surgery for the veins in December 2015 and she's been seeing the wound care center at [facility] for several months. Associated Signs and Symptoms: Patient reports having difficulty standing for long periods. HPI Description: This is an 81 yrs old female we have been following since the beginning of January for a wound on the right medial ankle. She had previously had venous ablation of the greater saphenous vein by Dr. Kellie Simmering. She came to [place] with erythema of the right leg and subsequently is opened in the medial aspect of the right ankle. She has not been very compliant with compression. We  ordered her at juxta lite stocking, however she apparently has not been able to wear it. The area on the medial aspect of her left ankle has much less induration and erythema. The edema is better controlled. No evidence of infection is noted. 07/18/14; no major change in the condition of this wound. There is still surface slough and probably some drainage. 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 All other veins showed no evidence of DVT, superficial venous thrombosis or incompetence. however she does have manifestations of chronic venous hypertension and I believe we will change her dressing over to Prisma and a Profore light dressing. she sees her primary care doctor regularly and her last hemoglobin A1c has been around 7%.her other comorbidities are hypertension, CABG in 1998, peptic ulcer disease, history of echo with a ejection fraction of 55-60% and grade 1 diastolic dysfunction. past surgical history include coronary artery bypass graft in 1998, cholecystectomy, appendectomy, hand surgery, endovenous ablation of the saphenous vein with laser by Dr. Kellie Simmering in December 2015. Electronic Signature(s) Signed: 08/18/2014 12:30:09 PM By: Christin Fudge MD, FACS Entered By: Christin Fudge on 08/15/2014 13:53:05 [name], [name] ([id number]) -------------------------------------------------------------------------------- Physical Exam Details Patient Name: [name], [name] Date of Service: 08/15/2014 1:15 PM Medical Record Number: [id number] Patient Account Number: [id number] Date of Birth/Sex: 1927-10-22 (81 y.o. Female) Treating RN: Primary Care Physician: Ria Bush Other Clinician: Referring Physician: Ria Bush Treating Physician/Extender: Frann Rider in Treatment: 0 Constitutional . Pulse regular. Respirations normal and unlabored. Afebrile.  . Eyes Nonicteric. Reactive to light. Ears, Nose, Mouth, and Throat Lips, teeth, and gums WNL.Marland Kitchen Moist mucosa without lesions . Neck supple and nontender. No palpable supraclavicular or cervical adenopathy. Normal sized without goiter. Respiratory WNL. No retractions.. Cardiovascular Pedal Pulses WNL. No clubbing, cyanosis or edema. Integumentary (Hair, Skin) The most superior of the 2 ulcerations is looking much cleaner. The more distal one has some slough and the chop debridement with a #3 curette. No crepitus or fluctuance. No peri-wound warmth or erythema. No masses.Marland Kitchen Psychiatric Judgement and insight Intact.. No evidence of depression, anxiety, or agitation.. Electronic Signature(s) Signed: 08/18/2014 12:30:09 PM By: Christin Fudge MD, FACS Entered By: Christin Fudge on 08/15/2014 13:54:23 [name], [name] ([id number]) -------------------------------------------------------------------------------- Physician Orders Details Patient Name: Banker, Esmay C. Date of Service: 08/15/2014 1:15 PM Medical Record Number: [id number] Patient Account Number: [id number] Date of Birth/Sex: [birth year] (81 y.o. Female) Treating RN: Cornell Barman Primary Care Physician: Ria Bush Other Clinician: Referring Physician: Ria Bush Treating Physician/Extender: Frann Rider in Treatment: 0 Verbal / Phone Orders: Yes Clinician: Cornell Barman Read Back and Verified: Yes Diagnosis Coding ICD-10 Coding Code Description E11.622 Type 2 diabetes mellitus with other skin ulcer E11.610 Type 2 diabetes mellitus with diabetic neuropathic arthropathy Chronic venous hypertension (idiopathic) with ulcer and inflammation of right lower I87.331 extremity Wound Cleansing Wound #1 Left,Medial Lower Leg o Cleanse wound with mild soap and water Anesthetic Wound #1 Left,Medial Lower Leg o Topical Lidocaine 4% cream applied to wound bed prior to debridement Primary Wound Dressing Wound #1  Left,Medial Lower Leg o Prisma Ag Secondary Dressing Wound #1 Left,Medial Lower Leg o ABD pad Dressing Change Frequency Wound #1 Left,Medial Lower Leg o Change dressing every week Follow-up Appointments Wound #1 Left,Medial Lower Leg o Return Appointment in 1 week. Edema Control Wound #1 Left,Medial Lower Leg o 2 Layer Lite Compression System - Right Lower Extremity [name], [name] ([id number]) Additional Orders / Instructions Wound #1 Left,Medial Lower Leg o Increase protein intake. Electronic Signature(s) Signed: 08/15/2014 3:41:50 PM By: Gretta Cool RN, BSN, Kim RN, BSN Signed: 08/18/2014 12:30:09 PM By: Christin Fudge MD, FACS Entered By: Gretta Cool RN, BSN, Kim on 08/15/2014 13:47:34 Sword, [name] ([id number]) -------------------------------------------------------------------------------- Problem List Details Patient Name: Oliver,  Glenys C. Date of Service: 08/15/2014 1:15 PM Medical Record Number: [id number] Patient Account Number: [id number] Date of Birth/Sex: 09/05/1927 (81 y.o. Female) Treating RN: Primary Care Physician: Ria Bush Other Clinician: Referring Physician: Ria Bush Treating Physician/Extender: Frann Rider in Treatment: 0 Active Problems ICD-10 Encounter Code Description Active Date Diagnosis E11.622 Type 2 diabetes mellitus with other skin ulcer 08/15/2014 Yes E11.610 Type 2 diabetes mellitus with diabetic neuropathic 08/15/2014 Yes arthropathy I87.331 Chronic venous hypertension (idiopathic) with ulcer and 08/15/2014 Yes inflammation of right lower extremity Inactive Problems Resolved Problems Electronic Signature(s) Signed: 08/18/2014 12:30:09 PM By: Christin Fudge MD, FACS Entered By: Christin Fudge on 08/15/2014 13:51:00 Leaton, [name] -------------------------------------------------------------------------------- Progress Note Details Patient Name: Gonyer, Babbette C. Date of Service: 08/15/2014  1:15 PM Medical Record Number: [id number] Patient Account Number: [id number] Date of Birth/Sex: 1928/01/12 (81 y.o. Female) Treating RN: Primary Care Physician: Ria Bush Other Clinician: Referring Physician: Ria Bush Treating Physician/Extender: Frann Rider in Treatment: 0 Subjective Chief Complaint Information obtained from Patient Patient presents for treatment of an open ulcer due to venous insufficiency. 81 year old patient who is known to me from the [facility] wound care center now comes in follow up here for a chronic venous ulceration of her right lower extremity which she's had for about 6 months. History of Present Illness (HPI) The following HPI elements were documented for the patient's wound: Location: ulcer in the right lower extremity for several months Quality: Patient reports experiencing a dull pain to affected area(s). Severity: Patient states wound (s) are getting better. Duration: Patient has had the wound for > 3 months prior to seeking treatment at the wound center Timing: Pain in wound is Intermittent (comes and goes Context: The wound appeared gradually over time Modifying Factors: Consults to this date include: vascular surgery for the veins in December 2015 and she's been seeing the wound care center at [facility] for several months. Associated Signs and Symptoms: Patient reports having difficulty standing for long periods. This is an 81 yrs old female we have been following since the beginning of January for a wound on the right medial ankle. She had previously had venous ablation of the greater saphenous vein by Dr. Kellie Simmering. She came to [place] with erythema of the right leg and subsequently is opened in the medial aspect of the right ankle. She has not been very compliant with compression. We ordered her at juxta lite stocking, however she apparently has not been able to wear it. The area on the medial aspect of her left ankle has much  less induration and erythema. The edema is better controlled. No evidence of infection is noted. 07/18/14; no major change in the condition of this wound. There is still surface slough and probably some drainage. 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 All other veins showed no evidence of DVT, superficial venous thrombosis or incompetence. however she does have manifestations of chronic venous hypertension and I believe we will change her dressing over to Prisma and a Profore light dressing. she sees her primary care doctor regularly and her last hemoglobin A1c has been around 7%.her other comorbidities are hypertension, CABG in 1998, peptic ulcer disease, history of echo with a ejection fraction of 55-60% and grade 1 diastolic dysfunction. past surgical history include coronary artery bypass graft in 1998, cholecystectomy, appendectomy, hand surgery, endovenous ablation of the saphenous vein  with laser by Dr. Kellie Simmering in December 2015. [name], [name] Wound History Patient presents with 1 open wound that has been present for approximately 5 mos. Patient has been treating wound in the following manner: [place] wound care. Laboratory tests have not been performed in the last month. Patient reportedly has not tested positive for an antibiotic resistant organism. Patient reportedly has not tested positive for osteomyelitis. Patient reportedly has had testing performed to evaluate circulation in the legs. Patient History Information obtained from Patient. Allergies No Known Allergies Family History Diabetes - Father, Siblings, Child, Hypertension - Child, Lung Disease - Child, Stroke - Mother, No family history of Cancer, Heart Disease, Kidney Disease, Seizures, Thyroid Problems. Social History Former smoker, Marital Status - Single, Alcohol Use - Never, Drug  Use - No History, Caffeine Use - Daily. Medical History Eyes Patient has history of Cataracts - removed Hematologic/Lymphatic Patient has history of Anemia Cardiovascular Patient has history of Coronary Artery Disease, Hypertension, Myocardial Infarction Endocrine Patient has history of Type II Diabetes Musculoskeletal Patient has history of Osteoarthritis Neurologic Denies history of Dementia, Neuropathy, Quadriplegia, Paraplegia, Seizure Disorder Oncologic Denies history of Received Chemotherapy, Received Radiation Patient is treated with Oral Agents. Blood sugar is not tested. Medical And Surgical History Notes Constitutional Symptoms (General Health) High Blood Pressure, open heart surgery, gall stone, hysterectomy; Fluid pill, Anxiety Review of Systems (ROS) Constitutional Symptoms (General Health) Complains or has symptoms of Fatigue. Eyes The patient has no complaints or symptoms. Ear/Nose/Mouth/Throat The patient has no complaints or symptoms. [name], [name] Hematologic/Lymphatic The patient has no complaints or symptoms. Respiratory The patient has no complaints or symptoms. Cardiovascular Complains or has symptoms of LE edema. Gastrointestinal The patient has no complaints or symptoms. Genitourinary Complains or has symptoms of Incontinence/dribbling. Denies complaints or symptoms of Kidney failure/ Dialysis. Immunological The patient has no complaints or symptoms. Integumentary (Skin) Complains or has symptoms of Wounds, Bleeding or bruising tendency, Swelling. Denies complaints or symptoms of Breakdown. Musculoskeletal Complains or has symptoms of Muscle Pain, Muscle Weakness. Neurologic The patient has no complaints or symptoms. Oncologic The patient has no complaints or symptoms. Psychiatric Complains or has symptoms of Anxiety. Objective Constitutional Pulse regular. Respirations normal and unlabored. Afebrile. Vitals Time Taken:  1:05 PM, Height: 58 in, Source: Stated, Weight: 151 lbs, Source: Stated, BMI: 31.6, Temperature: 98.2 F, Pulse: 76 bpm, Respiratory Rate: 18 breaths/min, Blood Pressure: 119/63 mmHg. Eyes Nonicteric. Reactive to light. Ears, Nose, Mouth, and Throat Lips, teeth, and gums WNL.Marland Kitchen Moist mucosa without lesions . Neck supple and nontender. No palpable supraclavicular or cervical adenopathy. Normal sized without goiter. Respiratory WNL. No retractions.Marland Kitchen Reasons, Cathleen C. ([id number]) Cardiovascular Pedal Pulses WNL. No clubbing, cyanosis or edema. Psychiatric Judgement and insight Intact.. No evidence of depression, anxiety, or agitation.. Integumentary (Hair, Skin) The most superior of the 2 ulcerations is looking much cleaner. The more distal one has some slough and the chop debridement with a #3 curette. No crepitus or fluctuance. No peri-wound warmth or erythema. No masses.. Wound #1 status is Open. Original cause of wound was Gradually Appeared. The wound is located on the Left,Medial Lower Leg. The wound measures 5cm length x 1.5cm width x 0.3cm depth; 5.89cm^2 area and 1.767cm^3 volume. The wound is limited to skin breakdown. There is a small amount of serosanguineous drainage noted. The wound margin is flat and intact. There is small (1-33%) pink granulation within the wound bed. There is a large (67-100%) amount of necrotic tissue within the wound  bed including Adherent Slough. The periwound skin appearance exhibited: Dry/Scaly, Moist, Erythema. The surrounding wound skin color is noted with erythema. Assessment Active Problems ICD-10 E11.622 - Type 2 diabetes mellitus with other skin ulcer E11.610 - Type 2 diabetes mellitus with diabetic neuropathic arthropathy I87.331 - Chronic venous hypertension (idiopathic) with ulcer and inflammation of right lower extremity This 81 year old elderly lady who has chronic venous hypertension and has previous surgery for varicose veins of the  right lower extremity has a persistent ulcer with venous stasis on the right lower extremity. 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 Her diabetes also is fairly well controlled but if she doesn't check her sugar regularly. I will continue with Prisma AG and a Coban light dressing for compression. [name], [name] ([id number]) She will also elevate her limb as much as possible and I have asked her to be walking around and if possible raising the legs even while she is at work. She will come back and see me next week. Procedures Wound #1 Wound #1 is a Venous Leg Ulcer located on the Left,Medial Lower Leg . There was a Skin/Subcutaneous Tissue Debridement [id number]) debridement with total area of 6 sq cm performed by Cillian Gwinner, Jackson Latino., MD. with the following instrument(s): Curette to remove Viable and Non-Viable tissue/material including Fibrin/Slough, Eschar, and Subcutaneous after achieving pain control using Lidocaine 5% topical ointment. A time out was conducted prior to the start of the procedure. A Minimum amount of bleeding was controlled with Pressure. The procedure was tolerated well with a pain level of 2 throughout and a pain level of 0 following the procedure. Post Debridement Measurements: 5cm length x 1.5cm width x 0.3cm depth; 1.767cm^3 volume. Plan Wound Cleansing: Wound #1 Left,Medial Lower Leg: Cleanse wound with mild soap and water Anesthetic: Wound #1 Left,Medial Lower Leg: Topical Lidocaine 4% cream applied to wound bed prior to debridement Primary Wound Dressing: Wound #1 Left,Medial Lower Leg: Prisma Ag Secondary Dressing: Wound #1 Left,Medial Lower Leg: ABD pad Dressing Change Frequency: Wound #1 Left,Medial Lower Leg: Change dressing every week Follow-up Appointments: Wound #1 Left,Medial Lower Leg: Return Appointment in 1 week. Edema Control: Wound #1 Left,Medial Lower Leg: 2  Layer Lite Compression System - Right Lower Extremity Additional Orders / Instructions: Wound #1 Left,Medial Lower Leg: Increase protein intake. [name], [name] ([id number]) This 81 year old elderly lady who has chronic venous hypertension and has previous surgery for varicose veins of the right lower extremity has a persistent ulcer with venous stasis on the right lower extremity. 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 Her diabetes also is fairly well controlled but if she doesn't check her sugar regularly. I will continue with Prisma AG and a Coban light dressing for compression. She will also elevate her limb as much as possible and I have asked her to be walking around and if possible raising the legs even while she is at work. She will come back and see me next week. Electronic Signature(s) Signed: 08/18/2014 12:30:09 PM By: Christin Fudge MD, FACS Entered By: Christin Fudge on 08/15/2014 13:56:14 [name], [name] ([id number]) -------------------------------------------------------------------------------- ROS/PFSH Details Patient Name: [name], [name] Date of Service: 08/15/2014 1:15 PM Medical Record Number: [id number] Patient Account Number: [id number] Date of Birth/Sex: 1927-07-02 (82 y.o. Female) Treating RN: Cornell Barman Primary Care Physician: Ria Bush Other Clinician:  Referring Physician: Ria Bush Treating Physician/Extender: Frann Rider in Treatment: 0 Information Obtained From Patient Wound History Do you currently have one or more open woundso Yes How many open wounds do you currently haveo 1 Approximately how long have you had your woundso 5 mos How have you been treating your wound(s) until nowo [facility] wound care Has your wound(s) ever healed and then re-openedo No Have you had any lab work done in the past montho No Have you tested positive for an antibiotic  resistant organism (MRSA, VRE)o No Have you tested positive for osteomyelitis (bone infection)o No Have you had any tests for circulation on your legso Yes Constitutional Symptoms (General Health) Complaints and Symptoms: Positive for: Fatigue Medical History: Past Medical History Notes: High Blood Pressure, open heart surgery, gall stone, hysterectomy; Fluid pill, Anxiety Cardiovascular Complaints and Symptoms: Positive for: LE edema Medical History: Positive for: Coronary Artery Disease; Hypertension; Myocardial Infarction Genitourinary Complaints and Symptoms: Positive for: Incontinence/dribbling Negative for: Kidney failure/ Dialysis Integumentary (Skin) Complaints and Symptoms: Positive for: Wounds; Bleeding or bruising tendency; Swelling Negative for: ZOELY, GILBREATH ([id number]) Musculoskeletal Complaints and Symptoms: Positive for: Muscle Pain; Muscle Weakness Medical History: Positive for: Osteoarthritis Psychiatric Complaints and Symptoms: Positive for: Anxiety Eyes Complaints and Symptoms: No Complaints or Symptoms Medical History: Positive for: Cataracts - removed Ear/Nose/Mouth/Throat Complaints and Symptoms: No Complaints or Symptoms Hematologic/Lymphatic Complaints and Symptoms: No Complaints or Symptoms Medical History: Positive for: Anemia Respiratory Complaints and Symptoms: No Complaints or Symptoms Gastrointestinal Complaints and Symptoms: No Complaints or Symptoms Endocrine Medical History: Positive for: Type II Diabetes Time with diabetes: no idea Treated with: Oral agents Blood sugar tested every day: No [name], Chrysta C. ([id number]) Immunological Complaints and Symptoms: No Complaints or Symptoms Neurologic Complaints and Symptoms: No Complaints or Symptoms Medical History: Negative for: Dementia; Neuropathy; Quadriplegia; Paraplegia; Seizure Disorder Oncologic Complaints and Symptoms: No Complaints or  Symptoms Medical History: Negative for: Received Chemotherapy; Received Radiation HBO Extended History Items Eyes: Cataracts Family and Social History Cancer: No; Diabetes: Yes - Father, Siblings, Child; Heart Disease: No; Hypertension: Yes - Child; Kidney Disease: No; Lung Disease: Yes - Child; Seizures: No; Stroke: Yes - Mother; Thyroid Problems: No; Former smoker; Marital Status - Single; Alcohol Use: Never; Drug Use: No History; Caffeine Use: Daily; Financial Concerns: No; Food, Clothing or Shelter Needs: No; Support System Lacking: No; Transportation Concerns: No; Advanced Directives: Yes; Living Will: Yes Physician Affirmation I have reviewed and agree with the above information. Electronic Signature(s) Signed: 08/15/2014 3:41:50 PM By: Gretta Cool RN, BSN, Kim RN, BSN Signed: 08/18/2014 12:30:09 PM By: Christin Fudge MD, FACS Entered By: Gretta Cool RN, BSN, Kim on 08/15/2014 13:31:17 Christman, [name] ([id number]) -------------------------------------------------------------------------------- [place] Details Patient Name: Waggle, Miranda C. Date of Service: 08/15/2014 Medical Record Number: [id number] Patient Account Number: [id number] Date of Birth/Sex: 09-21-1927 (81 y.o. Female) Treating RN: Primary Care Physician: Ria Bush Other Clinician: Referring Physician: Ria Bush Treating Physician/Extender: Frann Rider in Treatment: 0 Diagnosis Coding ICD-10 Codes Code Description E11.622 Type 2 diabetes mellitus with other skin ulcer E11.610 Type 2 diabetes mellitus with diabetic neuropathic arthropathy Chronic venous hypertension (idiopathic) with ulcer and inflammation of right lower I87.331 extremity Facility Procedures CPT4: Description Modifier Quantity Code [id number] - WOUND CARE VISIT-LEV 3 EST PT 1 CPT4: [id number] - DEB SUBQ TISSUE 20 SQ CM/< 1 ICD-10 Description Diagnosis E11.622 Type 2 diabetes mellitus with other skin ulcer I87.331 Chronic  venous hypertension (idiopathic) with ulcer and inflammation of right lower extremity  Physician Procedures CPT4: Description Modifier Quantity Code [id number] - WC PHYS LEVEL 3 - EST PT 25 1 ICD-10 Description Diagnosis E11.622 Type 2 diabetes mellitus with other skin ulcer E11.610 Type 2 diabetes mellitus with diabetic neuropathic arthropathy I87.331  Chronic venous hypertension (idiopathic) with ulcer and inflammation of right lower extremity CPT4: [id number] 11042 - WC PHYS SUBQ TISS 20 SQ CM 1 ICD-10 Description Diagnosis E11.622 Type 2 diabetes mellitus with other skin ulcer I87.331 LLOYD, BAKEY ([id number]) Electronic Signature(s) Signed: 08/18/2014 12:30:09 PM By: Christin Fudge MD, FACS Entered By: Gretta Cool, RN, BSN, Kim on 08/15/2014 15:44:25

## 2014-08-22 ENCOUNTER — Encounter: Payer: Medicare Other | Admitting: Surgery

## 2014-08-22 DIAGNOSIS — R609 Edema, unspecified: Secondary | ICD-10-CM | POA: Diagnosis not present

## 2014-08-22 DIAGNOSIS — L97821 Non-pressure chronic ulcer of other part of left lower leg limited to breakdown of skin: Secondary | ICD-10-CM | POA: Diagnosis not present

## 2014-08-22 DIAGNOSIS — I87332 Chronic venous hypertension (idiopathic) with ulcer and inflammation of left lower extremity: Secondary | ICD-10-CM | POA: Diagnosis not present

## 2014-08-22 DIAGNOSIS — E11622 Type 2 diabetes mellitus with other skin ulcer: Secondary | ICD-10-CM | POA: Diagnosis not present

## 2014-08-22 DIAGNOSIS — L03115 Cellulitis of right lower limb: Secondary | ICD-10-CM | POA: Diagnosis not present

## 2014-08-22 DIAGNOSIS — E11610 Type 2 diabetes mellitus with diabetic neuropathic arthropathy: Secondary | ICD-10-CM | POA: Diagnosis not present

## 2014-08-22 DIAGNOSIS — L97911 Non-pressure chronic ulcer of unspecified part of right lower leg limited to breakdown of skin: Secondary | ICD-10-CM | POA: Diagnosis not present

## 2014-08-22 DIAGNOSIS — I87331 Chronic venous hypertension (idiopathic) with ulcer and inflammation of right lower extremity: Secondary | ICD-10-CM | POA: Diagnosis not present

## 2014-08-29 ENCOUNTER — Other Ambulatory Visit
Admission: RE | Admit: 2014-08-29 | Discharge: 2014-08-29 | Disposition: A | Discharge status: Home / Self Care | Payer: Medicare Other | Source: Ambulatory Visit | Attending: Surgery | Admitting: Surgery

## 2014-08-29 ENCOUNTER — Encounter: Payer: Medicare Other | Admitting: Surgery

## 2014-08-29 DIAGNOSIS — S81801A Unspecified open wound, right lower leg, initial encounter: Secondary | ICD-10-CM | POA: Diagnosis not present

## 2014-08-29 DIAGNOSIS — I87331 Chronic venous hypertension (idiopathic) with ulcer and inflammation of right lower extremity: Secondary | ICD-10-CM | POA: Diagnosis not present

## 2014-08-29 DIAGNOSIS — L089 Local infection of the skin and subcutaneous tissue, unspecified: Secondary | ICD-10-CM | POA: Insufficient documentation

## 2014-08-29 DIAGNOSIS — L97911 Non-pressure chronic ulcer of unspecified part of right lower leg limited to breakdown of skin: Secondary | ICD-10-CM | POA: Diagnosis not present

## 2014-08-29 DIAGNOSIS — E11622 Type 2 diabetes mellitus with other skin ulcer: Secondary | ICD-10-CM | POA: Diagnosis not present

## 2014-08-29 DIAGNOSIS — L03115 Cellulitis of right lower limb: Secondary | ICD-10-CM | POA: Diagnosis not present

## 2014-08-29 DIAGNOSIS — E11610 Type 2 diabetes mellitus with diabetic neuropathic arthropathy: Secondary | ICD-10-CM | POA: Diagnosis not present

## 2014-08-29 DIAGNOSIS — R609 Edema, unspecified: Secondary | ICD-10-CM | POA: Diagnosis not present

## 2014-08-30 NOTE — Progress Notes (Signed)
VITINA, RAD ([id number]) Visit Report for 08/29/2014 Chief Complaint Document Details Patient Name: [name], [name] Date of Service: 08/29/2014 11:45 AM Medical Record Number: [id number] Patient Account Number: [id number] Date of Birth/Sex: November 01, 1927 (81 y.o. Female) Treating RN: Primary Care Physician: Ria Bush Other Clinician: Referring Physician: Ria Bush Treating Physician/Extender: BURNS, Charlean Sanfilippo in Treatment: 2 Information Obtained from: Patient Chief Complaint Patient presents for treatment of an open ulcer due to venous insufficiency. 81 year old patient who is known to me from the [facility] wound care center now comes in follow up here for a chronic venous ulceration of her right lower extremity which she's had for about 6 months. Electronic Signature(s) Signed: 08/29/2014 2:14:37 PM By: Loletha Grayer MD Entered By: Loletha Grayer on 08/29/2014 13:29:24 Uzzle, [name] ([id number]) -------------------------------------------------------------------------------- HPI Details Patient Name: [name], [name] Date of Service: 08/29/2014 11:45 AM Medical Record Number: [id number] Patient Account Number: [id number] Date of Birth/Sex: 02-Aug-1927 (81 y.o. Female) Treating RN: Primary Care Physician: Ria Bush Other Clinician: Referring Physician: Ria Bush Treating Physician/Extender: BURNS, Charlean Sanfilippo in Treatment: 2 History of Present Illness Location: ulcer in the right lower extremity for several months Quality: Patient reports experiencing a dull pain to affected area(s). Severity: Patient states wound (s) are getting better. Duration: Patient has had the wound for > 3 months prior to seeking treatment at the wound center Timing: Pain in wound is Intermittent (comes and goes Context: The wound appeared gradually over time Modifying Factors: Consults to this date include: vascular surgery for the veins in December 2015  and she's been seeing the wound care center at [facility] for several months. Associated Signs and Symptoms: Patient reports having difficulty standing for long periods. HPI Description: This is an 81 yrs old female we have been following since the beginning of January for a wound on the right medial ankle. She had previously had venous ablation of the greater saphenous vein by Dr. Kellie Simmering. She came to [place] with erythema of the right leg and subsequently is opened in the medial aspect of the right ankle. She has not been very compliant with compression. We ordered her at juxta lite stocking, however she apparently has not been able to wear it. The area on the medial aspect of her left ankle has much less induration and erythema. The edema is better controlled. No evidence of infection is noted. 07/18/14; no major change in the condition of this wound. There is still surface slough and probably some drainage. 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 All other veins showed no evidence of DVT, superficial venous thrombosis or incompetence. however she does have manifestations of chronic venous hypertension and I believe we will change her dressing over to Prisma and a Profore light dressing. she sees her primary care doctor regularly and her last hemoglobin A1c has been around 7%.her other comorbidities are hypertension, CABG in 1998, peptic ulcer disease, history of echo with a ejection fraction of 55-60% and grade 1 diastolic dysfunction. past surgical history include coronary artery bypass graft in 1998, cholecystectomy, appendectomy, hand surgery, endovenous ablation of the saphenous vein with laser by Dr. Kellie Simmering in December 2015. 08/29/2014 - she complains of increased calf pain over the past week. removed her compression bandage and pain improved. no fever or chills. Mild  clear drainage. Electronic Signature(s) Signed: 08/29/2014 2:14:37 PM By: Loletha Grayer MD  Entered By: Loletha Grayer on 08/29/2014 13:30:46 Smallman, [name] ([id number]) -------------------------------------------------------------------------------- Physical Exam Details Patient Name: [name], [name] Date of Service: 08/29/2014 11:45 AM Medical Record Number: [id number] Patient Account Number: [id number] Date of Birth/Sex: [birth year] (81 y.o. Female) Treating RN: Primary Care Physician: Ria Bush Other Clinician: Referring Physician: Ria Bush Treating Physician/Extender: BURNS, WALTER Weeks in Treatment: 2 Constitutional . Pulse regular. Respirations normal and unlabored. Afebrile. Marland Kitchen Respiratory WNL. No retractions.. Cardiovascular Pedal Pulses WNL. Integumentary (Hair, Skin) .Marland Kitchen Neurological Sensation normal to touch, pin,and vibration. Psychiatric Judgement and insight Intact.. Oriented times 3.. No evidence of depression, anxiety, or agitation.. Notes Right medial calf/ankle ulcerations appear worse based on pictures and measurements. She has cellulitis extending from the ankle to the proximal calf. Ulceration is very tender. Otherwise calf is soft and nontender. 1+ pitting edema. Multiple varicosities. Palpable DP and PT. ABI 1.0. Electronic Signature(s) Signed: 08/29/2014 2:14:37 PM By: Loletha Grayer MD Entered By: Loletha Grayer on 08/29/2014 13:32:07 Penaflor, [name] ([id number]) -------------------------------------------------------------------------------- Physician Orders Details Patient Name: [name], [name] Date of Service: 08/29/2014 11:45 AM Medical Record Number: [id number] Patient Account Number: [id number] Date of Birth/Sex: 1928/01/05 (81 y.o. Female) Treating RN: Montey Hora Primary Care Physician: Ria Bush Other Clinician: Referring Physician: Ria Bush Treating Physician/Extender: BURNS,  Charlean Sanfilippo in Treatment: 2 Verbal / Phone Orders: Yes Clinician: Montey Hora Read Back and Verified: Yes Diagnosis Coding Wound Cleansing Wound #1 Right,Medial Lower Leg o Cleanse wound with mild soap and water Anesthetic Wound #1 Right,Medial Lower Leg o Topical Lidocaine 4% cream applied to wound bed prior to debridement Primary Wound Dressing Wound #1 Right,Medial Lower Leg o Prisma Ag Secondary Dressing Wound #1 Right,Medial Lower Leg o ABD pad Dressing Change Frequency Wound #1 Right,Medial Lower Leg o Change dressing every week Follow-up Appointments Wound #1 Right,Medial Lower Leg o Return Appointment in 1 week. Edema Control Wound #1 Right,Medial Lower Leg o 2 Layer Lite Compression System - Right Lower Extremity Additional Orders / Instructions Wound #1 Right,Medial Lower Leg o Increase protein intake. Laboratory KASSY, PONTECORVO ([id number]) o Culture and Sensitivity - right lower leg wound oooo Electronic Signature(s) Signed: 08/29/2014 2:14:37 PM By: Loletha Grayer MD Signed: 08/29/2014 4:13:04 PM By: Montey Hora Entered By: Montey Hora on 08/29/2014 12:34:20 Van, [name] ([id number]) -------------------------------------------------------------------------------- Problem List Details Patient Name: Habig, Davion C. Date of Service: 08/29/2014 11:45 AM Medical Record Number: [id number] Patient Account Number: [id number] Date of Birth/Sex: 04/07/1927 (81 y.o. Female) Treating RN: Primary Care Physician: Ria Bush Other Clinician: Referring Physician: Ria Bush Treating Physician/Extender: BURNS, Charlean Sanfilippo in Treatment: 2 Active Problems ICD-10 Encounter Code Description Active Date Diagnosis E11.622 Type 2 diabetes mellitus with other skin ulcer 08/15/2014 Yes E11.610 Type 2 diabetes mellitus with diabetic neuropathic 08/15/2014 Yes arthropathy I87.331 Chronic venous hypertension (idiopathic) with  ulcer and 08/15/2014 Yes inflammation of right lower extremity L03.115 Cellulitis of right lower limb 08/29/2014 Yes Inactive Problems Resolved Problems Electronic Signature(s) Signed: 08/29/2014 2:14:37 PM By: Loletha Grayer MD Entered By: Loletha Grayer on 08/29/2014 13:29:13 Guarnieri, [name] ([id number]) -------------------------------------------------------------------------------- Progress Note Details Patient Name: Boran, Wilberta C. Date of Service: 08/29/2014 11:45 AM Medical Record Number: [id number] Patient Account Number: [id number] Date of Birth/Sex: [birth year] (81 y.o. Female) Treating RN: Primary Care Physician: Ria Bush Other Clinician: Referring Physician: Ria Bush Treating Physician/Extender: BURNS, Charlean Sanfilippo in Treatment: 2 Subjective Chief Complaint Information obtained from Patient Patient presents for treatment of an open ulcer due to venous insufficiency.  81 year old patient who is known to me from the [facility] wound care center now comes in follow up here for a chronic venous ulceration of her right lower extremity which she's had for about 6 months. History of Present Illness (HPI) The following HPI elements were documented for the patient's wound: Location: ulcer in the right lower extremity for several months Quality: Patient reports experiencing a dull pain to affected area(s). Severity: Patient states wound (s) are getting better. Duration: Patient has had the wound for > 3 months prior to seeking treatment at the wound center Timing: Pain in wound is Intermittent (comes and goes Context: The wound appeared gradually over time Modifying Factors: Consults to this date include: vascular surgery for the veins in December 2015 and she's been seeing the wound care center at [facility] for several months. Associated Signs and Symptoms: Patient reports having difficulty standing for long periods. This is an 81 yrs old female we have  been following since the beginning of January for a wound on the right medial ankle. She had previously had venous ablation of the greater saphenous vein by Dr. Kellie Simmering. She came to [place] with erythema of the right leg and subsequently is opened in the medial aspect of the right ankle. She has not been very compliant with compression. We ordered her at juxta lite stocking, however she apparently has not been able to wear it. The area on the medial aspect of her left ankle has much less induration and erythema. The edema is better controlled. No evidence of infection is noted. 07/18/14; no major change in the condition of this wound. There is still surface slough and probably some drainage. 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 All other veins showed no evidence of DVT, superficial venous thrombosis or incompetence. however she does have manifestations of chronic venous hypertension and I believe we will change her dressing over to Prisma and a Profore light dressing. she sees her primary care doctor regularly and her last hemoglobin A1c has been around 7%.her other comorbidities are hypertension, CABG in 1998, peptic ulcer disease, history of echo with a ejection fraction of 55-60% and grade 1 diastolic dysfunction. past surgical history include coronary artery bypass graft in 1998, cholecystectomy, appendectomy, hand surgery, endovenous ablation of the saphenous vein with laser by Dr. Kellie Simmering in December 2015. LISSET, PACKARD ([id number]) 08/29/2014 - she complains of increased calf pain over the past week. removed her compression bandage and pain improved. no fever or chills. Mild clear drainage. Objective Constitutional Pulse regular. Respirations normal and unlabored. Afebrile. Vitals Time Taken: 12:14 PM, Height: 58 in, Weight: 151 lbs, BMI: 31.6, Temperature: 98.3  F, Pulse: 73 bpm, Respiratory Rate: 18 breaths/min, Blood Pressure: 112/52 mmHg. Respiratory WNL. No retractions.. Cardiovascular Pedal Pulses WNL. Neurological Sensation normal to touch, pin,and vibration. Psychiatric Judgement and insight Intact.. Oriented times 3.. No evidence of depression, anxiety, or agitation.. General Notes: Right medial calf/ankle ulcerations appear worse based on pictures and measurements. She has cellulitis extending from the ankle to the proximal calf. Ulceration is very tender. Otherwise calf is soft and nontender. 1+ pitting edema. Multiple varicosities. Palpable DP and PT. ABI 1.0. Integumentary (Hair, Skin) Wound #1 status is Open. Original cause of wound was Gradually Appeared. The wound is located on the Right,Medial Lower Leg. The wound measures 5.2cm length x 3.4cm width x 0.3cm depth; 13.886cm^2 area  and 4.166cm^3 volume. The wound is limited to skin breakdown. There is no tunneling or undermining noted. There is a small amount of serosanguineous drainage noted. The wound margin is flat and intact. There is medium (34-66%) red, pink granulation within the wound bed. There is a small (1-33%) amount of necrotic tissue within the wound bed. The periwound skin appearance exhibited: Moist. The periwound skin appearance did not exhibit: Callus, Crepitus, Excoriation, Fluctuance, Friable, Induration, Localized Edema, Rash, Scarring, Dry/Scaly, Maceration, Atrophie Blanche, Cyanosis, Ecchymosis, Hemosiderin Staining, Mottled, Pallor, Rubor, Erythema. Periwound temperature was noted as No Abnormality. The periwound has tenderness on palpation. REGGINA, OSTRANDER ([id number]) Assessment Active Problems ICD-10 E11.622 - Type 2 diabetes mellitus with other skin ulcer E11.610 - Type 2 diabetes mellitus with diabetic neuropathic arthropathy I87.331 - Chronic venous hypertension (idiopathic) with ulcer and inflammation of right lower extremity L03.115 -  Cellulitis of right lower limb Right lower extremity venous stasis ulcerations with new onset cellulitis. Plan Wound Cleansing: Wound #1 Right,Medial Lower Leg: Cleanse wound with mild soap and water Anesthetic: Wound #1 Right,Medial Lower Leg: Topical Lidocaine 4% cream applied to wound bed prior to debridement Primary Wound Dressing: Wound #1 Right,Medial Lower Leg: Prisma Ag Secondary Dressing: Wound #1 Right,Medial Lower Leg: ABD pad Dressing Change Frequency: Wound #1 Right,Medial Lower Leg: Change dressing every week Follow-up Appointments: Wound #1 Right,Medial Lower Leg: Return Appointment in 1 week. Edema Control: Wound #1 Right,Medial Lower Leg: 2 Layer Lite Compression System - Right Lower Extremity Additional Orders / Instructions: Wound #1 Right,Medial Lower Leg: Increase protein intake. Laboratory ordered were: Culture and Sensitivity - right lower leg wound Nawrot, Jamari C. ([id number]) Follow-up culture obtained today. Doxycycline started. Continue with Prisma dressing changes daily to every other day. We will switch to Ace wrap compression until cellulitis is resolved. Frequent leg elevation. Electronic Signature(s) Signed: 08/29/2014 2:14:37 PM By: Loletha Grayer MD Entered By: Loletha Grayer on 08/29/2014 13:33:17 Mrozek, [name] ([id number]) -------------------------------------------------------------------------------- [place] Details Patient Name: Udovich, Jacole C. Date of Service: 08/29/2014 Medical Record Number: [id number] Patient Account Number: [id number] Date of Birth/Sex: 1927/07/31 (81 y.o. Female) Treating RN: Montey Hora Primary Care Physician: Ria Bush Other Clinician: Referring Physician: Ria Bush Treating Physician/Extender: BURNS, Charlean Sanfilippo in Treatment: 2 Diagnosis Coding ICD-10 Codes Code Description E11.622 Type 2 diabetes mellitus with other skin ulcer E11.610 Type 2 diabetes mellitus with  diabetic neuropathic arthropathy Chronic venous hypertension (idiopathic) with ulcer and inflammation of right lower I87.331 extremity L03.115 Cellulitis of right lower limb Facility Procedures CPT4 Code: [id number] Description: 99213 - WOUND CARE VISIT-LEV 3 EST PT Modifier: Quantity: 1 Physician Procedures CPT4: Description Modifier Quantity Code [id number] WC PHYS LEVEL 3 o NEW PT 1 ICD-10 Description Diagnosis I87.331 Chronic venous hypertension (idiopathic) with ulcer and inflammation of right lower extremity L03.115 Cellulitis of right lower limb Electronic Signature(s) Signed: 08/29/2014 2:14:37 PM By: Loletha Grayer MD Previous Signature: 08/29/2014 1:08:13 PM Version By: Montey Hora Entered By: Loletha Grayer on 08/29/2014 13:33:54

## 2014-08-30 NOTE — Progress Notes (Signed)
LAKISKA, FEIMSTER ([id number]) Visit Report for 08/29/2014 Arrival Information Details Patient Name: [name], [name] Date of Service: 08/29/2014 11:45 AM Medical Record Number: [id number] Patient Account Number: [id number] Date of Birth/Sex: Jul 03, 1927 (81 y.o. Female) Treating RN: Montey Hora Primary Care Physician: Ria Bush Other Clinician: Referring Physician: Ria Bush Treating Physician/Extender: BURNS, Charlean Sanfilippo in Treatment: 2 Visit Information History Since Last Visit Added or deleted any medications: No Patient Arrived: Walker Any new allergies or adverse reactions: No Arrival Time: 12:12 Had a fall or experienced change in No Accompanied By: self activities of daily living that may affect Transfer Assistance: None risk of falls: Patient Identification Verified: Yes Signs or symptoms of abuse/neglect since last No Secondary Verification Process Yes visito Completed: Hospitalized since last visit: No Patient Has Alerts: Yes Pain Present Now: Yes Patient Alerts: Patient on Blood Thinner Type II Diabetic 81 MG aspirin Electronic Signature(s) Signed: 08/29/2014 4:13:04 PM By: Montey Hora Entered By: Montey Hora on 08/29/2014 12:13:08 Guia, [name] ([id number]) -------------------------------------------------------------------------------- Clinic Level of Care Assessment Details Patient Name: [name], [name] Date of Service: 08/29/2014 11:45 AM Medical Record Number: [id number] Patient Account Number: [id number] Date of Birth/Sex: 06/09/1927 (81 y.o. Female) Treating RN: Montey Hora Primary Care Physician: Ria Bush Other Clinician: Referring Physician: Ria Bush Treating Physician/Extender: BURNS, Charlean Sanfilippo in Treatment: 2 Clinic Level of Care Assessment Items TOOL 4 Quantity Score []  - Use when only an EandM is performed on FOLLOW-UP visit 0 ASSESSMENTS - Nursing Assessment / Reassessment X - Reassessment  of Co-morbidities (includes updates in patient status) 1 10 X - Reassessment of Adherence to Treatment Plan 1 5 ASSESSMENTS - Wound and Skin Assessment / Reassessment X - Simple Wound Assessment / Reassessment - one wound 1 5 []  - Complex Wound Assessment / Reassessment - multiple wounds 0 []  - Dermatologic / Skin Assessment (not related to wound area) 0 ASSESSMENTS - Focused Assessment X - Circumferential Edema Measurements - multi extremities 1 5 []  - Nutritional Assessment / Counseling / Intervention 0 X - Lower Extremity Assessment (monofilament, tuning fork, pulses) 1 5 []  - Peripheral Arterial Disease Assessment (using hand held doppler) 0 ASSESSMENTS - Ostomy and/or Continence Assessment and Care []  - Incontinence Assessment and Management 0 []  - Ostomy Care Assessment and Management (repouching, etc.) 0 PROCESS - Coordination of Care X - Simple Patient / Family Education for ongoing care 1 15 []  - Complex (extensive) Patient / Family Education for ongoing care 0 []  - Staff obtains Programmer, systems, Records, Test Results / Process Orders 0 []  - Staff telephones HHA, Nursing Homes / Clarify orders / etc 0 []  - Routine Transfer to another Facility (non-emergent condition) 0 [name], [name] ([id number]) []  - Routine Hospital Admission (non-emergent condition) 0 []  - New Admissions / Biomedical engineer / Ordering NPWT, Apligraf, etc. 0 []  - Emergency Hospital Admission (emergent condition) 0 X - Simple Discharge Coordination 1 10 []  - Complex (extensive) Discharge Coordination 0 PROCESS - Special Needs []  - Pediatric / Minor Patient Management 0 []  - Isolation Patient Management 0 []  - Hearing / Language / Visual special needs 0 []  - Assessment of Community assistance (transportation, D/C planning, etc.) 0 []  - Additional assistance / Altered mentation 0 []  - Support Surface(s) Assessment (bed, cushion, seat, etc.) 0 INTERVENTIONS - Wound Cleansing / Measurement X - Simple Wound  Cleansing - one wound 1 5 []  - Complex Wound Cleansing - multiple wounds 0 X - Wound Imaging (photographs - any number of wounds) 1 5 []  -  Wound Tracing (instead of photographs) 0 X - Simple Wound Measurement - one wound 1 5 []  - Complex Wound Measurement - multiple wounds 0 INTERVENTIONS - Wound Dressings X - Small Wound Dressing one or multiple wounds 1 10 []  - Medium Wound Dressing one or multiple wounds 0 []  - Large Wound Dressing one or multiple wounds 0 []  - Application of Medications - topical 0 []  - Application of Medications - injection 0 INTERVENTIONS - Miscellaneous []  - External ear exam 0 [name], [name] ([id number]) []  - Specimen Collection (cultures, biopsies, blood, body fluids, etc.) 0 []  - Specimen(s) / Culture(s) sent or taken to Lab for analysis 0 []  - Patient Transfer (multiple staff / Harrel Lemon Lift / Similar devices) 0 []  - Simple Staple / Suture removal (25 or less) 0 []  - Complex Staple / Suture removal (26 or more) 0 []  - Hypo / Hyperglycemic Management (close monitor of Blood Glucose) 0 []  - Ankle / Brachial Index (ABI) - do not check if billed separately 0 X - Vital Signs 1 5 Has the patient been seen at the hospital within the last three years: Yes Total Score: 85 Level Of Care: New/Established - Level 3 Electronic Signature(s) Signed: 08/29/2014 1:08:00 PM By: Montey Hora Entered By: Montey Hora on 08/29/2014 13:08:00 Wilhelmi, [name] ([id number]) -------------------------------------------------------------------------------- Encounter Discharge Information Details Patient Name: [name], [name] Date of Service: 08/29/2014 11:45 AM Medical Record Number: [id number] Patient Account Number: [id number] Date of Birth/Sex: [birth year] (81 y.o. Female) Treating RN: Primary Care Physician: Ria Bush Other Clinician: Referring Physician: Ria Bush Treating Physician/Extender: BURNS, Charlean Sanfilippo in Treatment: 2 Encounter Discharge  Information Items Discharge Pain Level: 0 Discharge Condition: Stable Ambulatory Status: Walker Discharge Destination: Home Transportation: Private Auto Accompanied By: self Schedule Follow-up Appointment: Yes Medication Reconciliation completed No and provided to Patient/Care Denzal Meir: Provided on Clinical Summary of Care: 08/29/2014 Form Type Recipient Paper Patient LL Electronic Signature(s) Signed: 08/29/2014 1:10:26 PM By: Montey Hora Previous Signature: 08/29/2014 12:59:55 PM Version By: Ruthine Dose Entered By: Montey Hora on 08/29/2014 13:10:25 Chokshi, [name] ([id number]) -------------------------------------------------------------------------------- Lower Extremity Assessment Details Patient Name: Hayden, Robynne C. Date of Service: 08/29/2014 11:45 AM Medical Record Number: [id number] Patient Account Number: [id number] Date of Birth/Sex: [birth year] (81 y.o. Female) Treating RN: Montey Hora Primary Care Physician: Ria Bush Other Clinician: Referring Physician: Ria Bush Treating Physician/Extender: BURNS, Charlean Sanfilippo in Treatment: 2 Edema Assessment Assessed: [Left: No] [Right: No] E[Left: dema] [Right: :] Calf Left: Right: Point of Measurement: 27 cm From Medial Instep cm 27.5 cm Ankle Left: Right: Point of Measurement: 10 cm From Medial Instep cm 20.7 cm Vascular Assessment Pulses: Posterior Tibial Palpable: [Right:Yes] Dorsalis Pedis Palpable: [Right:Yes] Extremity colors, hair growth, and conditions: Extremity Color: [Right:Red] Hair Growth on Extremity: [Right:No] Temperature of Extremity: [Right:Warm] Capillary Refill: [Right:< 3 seconds] Toe Nail Assessment Left: Right: Thick: No Discolored: No Deformed: No Improper Length and Hygiene: No Electronic Signature(s) Signed: 08/29/2014 4:13:04 PM By: Montey Hora Entered By: Montey Hora on 08/29/2014 12:20:54 Marchitto, [name] ([id number]) Hughart, Kaia Loletha Grayer  ([id number]) -------------------------------------------------------------------------------- Multi Wound Chart Details Patient Name: Simson, Orelia C. Date of Service: 08/29/2014 11:45 AM Medical Record Number: [id number] Patient Account Number: [id number] Date of Birth/Sex: 11-29-1927 (81 y.o. Female) Treating RN: Montey Hora Primary Care Physician: Ria Bush Other Clinician: Referring Physician: Ria Bush Treating Physician/Extender: BURNS, Charlean Sanfilippo in Treatment: 2 Vital Signs Height(in): 58 Pulse(bpm): 73 Weight(lbs): 151 Blood Pressure 112/52 (mmHg): Body Mass Index(BMI): 32 Temperature(F): 98.3 Respiratory Rate 18 (breaths/min):  Photos: [1:No Photos] [N/A:N/A] Wound Location: [1:Left Lower Leg - Medial] [N/A:N/A] Wounding Event: [1:Gradually Appeared] [N/A:N/A] Primary Etiology: [1:Venous Leg Ulcer] [N/A:N/A] Comorbid History: [1:Cataracts, Anemia, Coronary Artery Disease, Hypertension, Myocardial Infarction, Type II Diabetes, Osteoarthritis] [N/A:N/A] Date Acquired: [1:04/04/2014] [N/A:N/A] Weeks of Treatment: [1:2] [N/A:N/A] Wound Status: [1:Open] [N/A:N/A] Measurements L x W x D 5.2x3.4x0.3 [N/A:N/A] (cm) Area (cm) : [1:13.886] [N/A:N/A] Volume (cm) : [1:4.166] [N/A:N/A] % Reduction in Area: [1:-135.80%] [N/A:N/A] % Reduction in Volume: -135.80% [N/A:N/A] Classification: [1:Full Thickness Without Exposed Support Structures] [N/A:N/A] HBO Classification: [1:Grade 1] [N/A:N/A] Exudate Amount: [1:Small] [N/A:N/A] Exudate Type: [1:Serosanguineous] [N/A:N/A] Exudate Color: [1:red, brown] [N/A:N/A] Wound Margin: [1:Flat and Intact] [N/A:N/A] Granulation Amount: [1:Medium (34-66%)] [N/A:N/A] Granulation Quality: [1:Red, Pink] [N/A:N/A] Necrotic Amount: [1:Small (1-33%)] [N/A:N/A] Exposed Structures: [N/A:N/A] Fascia: No Fat: No Tendon: No Muscle: No Joint: No Bone: No Limited to Skin Breakdown Epithelialization: Small (1-33%) N/A  N/A Periwound Skin Texture: Edema: No N/A N/A Excoriation: No Induration: No Callus: No Crepitus: No Fluctuance: No Friable: No Rash: No Scarring: No Periwound Skin Moist: Yes N/A N/A Moisture: Maceration: No Dry/Scaly: No Periwound Skin Color: Atrophie Blanche: No N/A N/A Cyanosis: No Ecchymosis: No Erythema: No Hemosiderin Staining: No Mottled: No Pallor: No Rubor: No Temperature: No Abnormality N/A N/A Tenderness on Yes N/A N/A Palpation: Wound Preparation: Ulcer Cleansing: Other: N/A N/A soap and water Topical Anesthetic Applied: Other: lidociane 4% Treatment Notes Electronic Signature(s) Signed: 08/29/2014 4:13:04 PM By: Montey Hora Entered By: Montey Hora on 08/29/2014 12:33:02 Yeomans, [name] ([id number]) -------------------------------------------------------------------------------- [place] Details Patient Name: Jenning, Arieliz C. Date of Service: 08/29/2014 11:45 AM Medical Record Number: [id number] Patient Account Number: [id number] Date of Birth/Sex: 03-15-1928 (81 y.o. Female) Treating RN: Montey Hora Primary Care Physician: Ria Bush Other Clinician: Referring Physician: Ria Bush Treating Physician/Extender: BURNS, Charlean Sanfilippo in Treatment: 2 Active Inactive Abuse / Safety / Falls / Self Care Management Nursing Diagnoses: Potential for falls Goals: Patient will remain injury free Date Initiated: 08/15/2014 Goal Status: Active Interventions: Assess fall risk on admission and as needed Notes: Nutrition Nursing Diagnoses: Impaired glucose control: actual or potential Goals: Patient/caregiver verbalizes understanding of need to maintain therapeutic glucose control per primary care physician Date Initiated: 08/15/2014 Goal Status: Active Interventions: Assess patient nutrition upon admission and as needed per policy Notes: Orientation to the Wound Care Program Nursing Diagnoses: Knowledge  deficit related to the wound healing center program Goals: Patient/caregiver will verbalize understanding of the [place], Olamae C. ([id number]) Date Initiated: 08/15/2014 Goal Status: Active Interventions: Provide education on orientation to the wound center Notes: Venous Leg Ulcer Nursing Diagnoses: Actual venous Insuffiency (use after diagnosis is confirmed) Goals: Patient will maintain optimal edema control Date Initiated: 08/15/2014 Goal Status: Active Interventions: Compression as ordered Treatment Activities: Test ordered outside of clinic : 08/29/2014 Notes: Wound/Skin Impairment Nursing Diagnoses: Impaired tissue integrity Goals: Patient/caregiver will verbalize understanding of skin care regimen Date Initiated: 08/15/2014 Goal Status: Active Interventions: Assess patient/caregiver ability to obtain necessary supplies Notes: Electronic Signature(s) Signed: 08/29/2014 4:13:04 PM By: Montey Hora Entered By: Montey Hora on 08/29/2014 12:32:49 Alleva, Rhandi C. ([id number]) -------------------------------------------------------------------------------- Pain Assessment Details Patient Name: Trauger, Wendee C. Date of Service: 08/29/2014 11:45 AM Medical Record Number: [id number] Patient Account Number: [id number] Date of Birth/Sex: [birth year] (81 y.o. Female) Treating RN: Montey Hora Primary Care Physician: Ria Bush Other Clinician: Referring Physician: Ria Bush Treating Physician/Extender: BURNS, Charlean Sanfilippo in Treatment: 2 Active Problems Location of Pain Severity and Description of Pain Patient Has [name]  Yes Site Locations Pain Location: Pain in Ulcers With Dressing Change: Yes Duration of the Pain. Constant / Intermittento Constant Pain Management and Medication Current Pain Management: Electronic Signature(s) Signed: 08/29/2014 4:13:04 PM By: Montey Hora Entered By: Montey Hora on 08/29/2014  12:13:31 Seabolt, [name] ([id number]) -------------------------------------------------------------------------------- Patient/Caregiver Education Details Patient Name: Al, Zamyah C. Date of Service: 08/29/2014 11:45 AM Medical Record Number: [id number] Patient Account Number: [id number] Date of Birth/Gender: 1928/01/23 (81 y.o. Female) Treating RN: Montey Hora Primary Care Physician: Ria Bush Other Clinician: Referring Physician: Ria Bush Treating Physician/Extender: BURNS, Charlean Sanfilippo in Treatment: 2 Education Assessment Education Provided To: Patient Education Topics Provided Venous: Handouts: Other: importance of compression Methods: Explain/Verbal Responses: State content correctly Electronic Signature(s) Signed: 08/29/2014 4:13:04 PM By: Montey Hora Entered By: Montey Hora on 08/29/2014 12:33:32 Koehl, [name] ([id number]) -------------------------------------------------------------------------------- Wound Assessment Details Patient Name: Stillion, Lyza C. Date of Service: 08/29/2014 11:45 AM Medical Record Number: [id number] Patient Account Number: [id number] Date of Birth/Sex: Oct 30, 1927 (81 y.o. Female) Treating RN: Montey Hora Primary Care Physician: Ria Bush Other Clinician: Referring Physician: Ria Bush Treating Physician/Extender: BURNS, Charlean Sanfilippo in Treatment: 2 Wound Status Wound Number: 1 Primary Venous Leg Ulcer Etiology: Wound Location: Left Lower Leg - Medial Wound Open Wounding Event: Gradually Appeared Status: Date Acquired: 04/04/2014 Comorbid Cataracts, Anemia, Coronary Artery Weeks Of Treatment: 2 History: Disease, Hypertension, Myocardial Clustered Wound: No Infarction, Type II Diabetes, Osteoarthritis Photos Photo Uploaded By: Montey Hora on 08/29/2014 15:54:47 Wound Measurements Length: (cm) 5.2 Width: (cm) 3.4 Depth: (cm) 0.3 Area: (cm) 13.886 Volume: (cm) 4.166 %  Reduction in Area: -135.8% % Reduction in Volume: -135.8% Epithelialization: Small (1-33%) Tunneling: No Undermining: No Wound Description Full Thickness Without Exposed Foul Odor A Classification: Support Structures Diabetic Severity Grade 1 ([place]): Wound Margin: Flat and Intact Exudate Amount: Small Exudate Type: Serosanguineous Exudate Color: red, brown fter Cleansing: No Wound Bed Wilcher, Deriana C. ([id number]) Granulation Amount: Medium (34-66%) Exposed Structure Granulation Quality: Red, Pink Fascia Exposed: No Necrotic Amount: Small (1-33%) Fat Layer Exposed: No Tendon Exposed: No Muscle Exposed: No Joint Exposed: No Bone Exposed: No Limited to Skin Breakdown Periwound Skin Texture Texture Color No Abnormalities Noted: No No Abnormalities Noted: No Callus: No Atrophie Blanche: No Crepitus: No Cyanosis: No Excoriation: No Ecchymosis: No Fluctuance: No Erythema: No Friable: No Hemosiderin Staining: No Induration: No Mottled: No Localized Edema: No Pallor: No Rash: No Rubor: No Scarring: No Temperature / Pain Moisture Temperature: No Abnormality No Abnormalities Noted: No Tenderness on Palpation: Yes Dry / Scaly: No Maceration: No Moist: Yes Wound Preparation Ulcer Cleansing: Other: soap and water, Topical Anesthetic Applied: Other: lidociane 4%, Electronic Signature(s) Signed: 08/29/2014 4:13:04 PM By: Montey Hora Entered By: Montey Hora on 08/29/2014 12:28:18 Petro, [name] ([id number]) -------------------------------------------------------------------------------- Vitals Details Patient Name: Fuerstenberg, Tanish C. Date of Service: 08/29/2014 11:45 AM Medical Record Number: [id number] Patient Account Number: [id number] Date of Birth/Sex: Oct 02, 1927 (81 y.o. Female) Treating RN: Montey Hora Primary Care Physician: Ria Bush Other Clinician: Referring Physician: Ria Bush Treating Physician/Extender: BURNS,  Charlean Sanfilippo in Treatment: 2 Vital Signs Time Taken: 12:14 Temperature (F): 98.3 Height (in): 58 Pulse (bpm): 73 Weight (lbs): 151 Respiratory Rate (breaths/min): 18 Body Mass Index (BMI): 31.6 Blood Pressure (mmHg): 112/52 Reference Range: 80 - 120 mg / dl Electronic Signature(s) Signed: 08/29/2014 4:13:04 PM By: Montey Hora Entered By: Montey Hora on 08/29/2014 12:16:06

## 2014-09-02 ENCOUNTER — Ambulatory Visit (INDEPENDENT_AMBULATORY_CARE_PROVIDER_SITE_OTHER): Payer: Medicare Other | Admitting: Family Medicine

## 2014-09-02 ENCOUNTER — Encounter: Payer: Self-pay | Admitting: Family Medicine

## 2014-09-02 VITALS — BP 122/56 | HR 88 | Temp 98.4°F | Wt 149.5 lb

## 2014-09-02 DIAGNOSIS — I87311 Chronic venous hypertension (idiopathic) with ulcer of right lower extremity: Secondary | ICD-10-CM

## 2014-09-02 DIAGNOSIS — I872 Venous insufficiency (chronic) (peripheral): Secondary | ICD-10-CM

## 2014-09-02 DIAGNOSIS — D509 Iron deficiency anemia, unspecified: Secondary | ICD-10-CM | POA: Diagnosis not present

## 2014-09-02 DIAGNOSIS — E1159 Type 2 diabetes mellitus with other circulatory complications: Secondary | ICD-10-CM

## 2014-09-02 DIAGNOSIS — I739 Peripheral vascular disease, unspecified: Secondary | ICD-10-CM

## 2014-09-02 DIAGNOSIS — L97919 Non-pressure chronic ulcer of unspecified part of right lower leg with unspecified severity: Principal | ICD-10-CM

## 2014-09-02 DIAGNOSIS — E1151 Type 2 diabetes mellitus with diabetic peripheral angiopathy without gangrene: Secondary | ICD-10-CM

## 2014-09-02 DIAGNOSIS — I83019 Varicose veins of right lower extremity with ulcer of unspecified site: Secondary | ICD-10-CM

## 2014-09-02 LAB — CBC WITH DIFFERENTIAL/PLATELET
Basophils Absolute: 0.1 10*3/uL (ref 0.0–0.1)
Basophils Relative: 0.6 % (ref 0.0–3.0)
EOS PCT: 0.8 % (ref 0.0–5.0)
Eosinophils Absolute: 0.1 10*3/uL (ref 0.0–0.7)
HEMATOCRIT: 37.8 % (ref 36.0–46.0)
Hemoglobin: 12.3 g/dL (ref 12.0–15.0)
LYMPHS ABS: 1.2 10*3/uL (ref 0.7–4.0)
Lymphocytes Relative: 13.4 % (ref 12.0–46.0)
MCHC: 32.4 g/dL (ref 30.0–36.0)
MCV: 88.7 fl (ref 78.0–100.0)
Monocytes Absolute: 0.5 10*3/uL (ref 0.1–1.0)
Monocytes Relative: 6.3 % (ref 3.0–12.0)
NEUTROS ABS: 6.9 10*3/uL (ref 1.4–7.7)
Neutrophils Relative %: 78.9 % — ABNORMAL HIGH (ref 43.0–77.0)
Platelets: 317 10*3/uL (ref 150.0–400.0)
RBC: 4.26 Mil/uL (ref 3.87–5.11)
RDW: 17.1 % — ABNORMAL HIGH (ref 11.5–15.5)
WBC: 8.7 10*3/uL (ref 4.0–10.5)

## 2014-09-02 LAB — COMPREHENSIVE METABOLIC PANEL
ALK PHOS: 109 U/L (ref 39–117)
ALT: 16 U/L (ref 0–35)
AST: 21 U/L (ref 0–37)
Albumin: 3.6 g/dL (ref 3.5–5.2)
BUN: 21 mg/dL (ref 6–23)
CO2: 30 mEq/L (ref 19–32)
Calcium: 9.4 mg/dL (ref 8.4–10.5)
Chloride: 102 mEq/L (ref 96–112)
Creatinine, Ser: 0.87 mg/dL (ref 0.40–1.20)
GFR: 65.73 mL/min (ref >60.00)
Glucose, Bld: 180 mg/dL — ABNORMAL HIGH (ref 70–99)
POTASSIUM: 4.5 meq/L (ref 3.5–5.1)
Sodium: 138 mEq/L (ref 135–145)
Total Bilirubin: 0.3 mg/dL (ref 0.2–1.2)
Total Protein: 7.1 g/dL (ref 6.0–8.3)

## 2014-09-02 LAB — WOUND CULTURE

## 2014-09-02 LAB — IBC PANEL
IRON: 79 ug/dL (ref 42–145)
Saturation Ratios: 23.5 % (ref 20.0–50.0)
TRANSFERRIN: 240 mg/dL (ref 212.0–360.0)

## 2014-09-02 LAB — FERRITIN: FERRITIN: 42.2 ng/mL (ref 10.0–291.0)

## 2014-09-02 NOTE — Assessment & Plan Note (Signed)
Recent wound culture grew stap aureus, strep agalactiae, rare proteus species. On doxy. Wound redressed in office with silver fiber she brings today. rec continued f/u with wound clinic - has f/u in 2 days.

## 2014-09-02 NOTE — Patient Instructions (Signed)
labwork today. Wound redressed today. Start glucerna supplement - 1 shake daily 30 min prior to mid day meal - you need to ensure good protein intake to help wound heal. Keep follow up with wound clinic.

## 2014-09-02 NOTE — Progress Notes (Signed)
Pre visit review using our clinic review tool, if applicable. No additional management support is needed unless otherwise documented below in the visit note. 

## 2014-09-02 NOTE — Assessment & Plan Note (Signed)
Reports compliance with iron 1 tab bid for last 6 mo, did receive 1u pRBC transfusion 02/2014. Recheck labs today. Lab Results  Component Value Date   HGB 7.1* 02/20/2014

## 2014-09-02 NOTE — Assessment & Plan Note (Signed)
Pt aware of importance to stay off her feet and elevate leg.

## 2014-09-02 NOTE — Progress Notes (Addendum)
BP 122/56 mmHg  Pulse 88  Temp(Src) 98.4 F (36.9 C) (Oral)  Wt 149 lb 8 oz (67.813 kg)   CC: f/u wound clinic  Subjective:    [name], female    DOB: 1927-07-26, 81 y.o.   MRN: [id number]  HPI: [name] is a 81 y.o. female presenting on 09/02/2014 for Follow-up   Seen at wound clinic for poorly healing venous ulcer wound. She states this never fully healed. Has been advised to stay off leg and keep leg elevated. She is currently on a course of doxycycline.   Next appointment with wound doctors is Thursday. Sees once a week. Not eating well - no appetite. Advised to f/u with [place]. Has glucerna but doesn't take.  Relevant past medical, surgical, family and social history reviewed and updated as indicated. Interim medical history since our last visit reviewed. Allergies and medications reviewed and updated. Current Outpatient Prescriptions on File Prior to Visit  Medication Sig  . ALPRAZolam (XANAX) 1 MG tablet Take 0.5 mg by mouth 4 (four) times daily. For anxiety  . Ascorbic Acid (VITAMIN C PO) Take 1,000 mg by mouth daily.   Marland Kitchen aspirin EC 81 MG tablet Take 81 mg by mouth daily.  Marland Kitchen CALCIUM PO Take 600 mg by mouth daily.   . Cyanocobalamin (VITAMIN B 12 PO) Take 1 tablet by mouth daily.  Marland Kitchen glucosamine-chondroitin 500-400 MG tablet Take 1 tablet by mouth daily.   Marland Kitchen loperamide (IMODIUM A-D) 2 MG tablet Take 2 mg by mouth 4 (four) times daily as needed for diarrhea or loose stools.  . nitroGLYCERIN (NITROSTAT) 0.4 MG SL tablet Place 0.4 mg under the tongue every 5 (five) minutes as needed. For chest pain May take up to 3 doses, if pain persists call 911.  . pioglitazone (ACTOS) 30 MG tablet Take 30 mg by mouth daily. For diabetes  . ramipril (ALTACE) 5 MG capsule TAKE 1 CAPSULE BY MOUTH EVERY DAY for blood pressure  . rOPINIRole (REQUIP) 1 MG tablet Take 1 mg by mouth 3 (three) times daily.   . traZODone (DESYREL) 150 MG tablet Take 150 mg by mouth at bedtime.  For sleep  . VITAMIN E PO Take 1 tablet by mouth daily.    No current facility-administered medications on file prior to visit.    Review of Systems Per HPI unless specifically indicated above     Objective:    BP 122/56 mmHg  Pulse 88  Temp(Src) 98.4 F (36.9 C) (Oral)  Wt 149 lb 8 oz (67.813 kg)  Wt Readings from Last 3 Encounters:  11/12/14 153 lb (69.4 kg)  10/31/14 150 lb 3.2 oz (68.13 kg)  09/30/14 148 lb (67.132 kg)    Physical Exam  Constitutional: She appears well-developed and well-nourished. No distress.  Musculoskeletal: She exhibits no edema.  R medial lower leg with 2 large venous ulcers about 2-3cm diameter with granulation tissue present without significant surrounding erythema.  2+ DP on right. No significant pedal edema today.  Skin: Skin is warm. No rash noted. No erythema.  Nursing note and vitals reviewed.  Results for orders placed or performed in visit on 09/02/14  Comprehensive metabolic panel  Result Value Ref Range   Sodium 138 135 - 145 mEq/L   Potassium 4.5 3.5 - 5.1 mEq/L   Chloride 102 96 - 112 mEq/L   CO2 30 19 - 32 mEq/L   Glucose, Bld 180 (H) 70 - 99 mg/dL   BUN 21  6 - 23 mg/dL   Creatinine, Ser 0.87 0.40 - 1.20 mg/dL   Total Bilirubin 0.3 0.2 - 1.2 mg/dL   Alkaline Phosphatase 109 39 - 117 U/L   AST 21 0 - 37 U/L   ALT 16 0 - 35 U/L   Total Protein 7.1 6.0 - 8.3 g/dL   Albumin 3.6 3.5 - 5.2 g/dL   Calcium 9.4 8.4 - 10.5 mg/dL   GFR 65.73 >60.00 mL/min  CBC with Differential/Platelet  Result Value Ref Range   WBC 8.7 4.0 - 10.5 K/uL   RBC 4.26 3.87 - 5.11 Mil/uL   Hemoglobin 12.3 12.0 - 15.0 g/dL   HCT 37.8 36.0 - 46.0 %   MCV 88.7 78.0 - 100.0 fl   MCHC 32.4 30.0 - 36.0 g/dL   RDW 17.1 (H) 11.5 - 15.5 %   Platelets 317.0 150.0 - 400.0 K/uL   Neutrophils Relative % 78.9 (H) 43.0 - 77.0 %   Lymphocytes Relative 13.4 12.0 - 46.0 %   Monocytes Relative 6.3 3.0 - 12.0 %   Eosinophils Relative 0.8 0.0 - 5.0 %   Basophils  Relative 0.6 0.0 - 3.0 %   Neutro Abs 6.9 1.4 - 7.7 K/uL   Lymphs Abs 1.2 0.7 - 4.0 K/uL   Monocytes Absolute 0.5 0.1 - 1.0 K/uL   Eosinophils Absolute 0.1 0.0 - 0.7 K/uL   Basophils Absolute 0.1 0.0 - 0.1 K/uL  Ferritin  Result Value Ref Range   Ferritin 42.2 10.0 - 291.0 ng/mL  IBC panel  Result Value Ref Range   Iron 79 42 - 145 ug/dL   Transferrin 240.0 212.0 - 360.0 mg/dL   Saturation Ratios 23.5 20.0 - 50.0 %      Assessment & Plan:   Problem List Items Addressed This Visit    Venous ulcer of leg ([place]) - Primary    Recent wound culture grew stap aureus, strep agalactiae, rare proteus species. On doxy. Wound redressed in office with silver fiber she brings today. rec continued f/u with wound clinic - has f/u in 2 days.      Microcytic anemia    Reports compliance with iron 1 tab bid for last 6 mo, did receive 1u pRBC transfusion 02/2014. Recheck labs today. Lab Results  Component Value Date   HGB 7.1* 02/20/2014        Relevant Orders   CBC with Differential/Platelet (Completed)   Ferritin (Completed)   IBC panel (Completed)   Diabetes mellitus type 2 with peripheral artery disease ([place])    ABI 2013 - R 0.91, L 0.83      Chronic venous insufficiency    Pt aware of importance to stay off her feet and elevate leg.      Relevant Orders   Comprehensive metabolic panel (Completed)       Follow up plan: Return in about 3 months (around 12/03/2014), or if symptoms worsen or fail to improve, for follow up visit.

## 2014-09-03 ENCOUNTER — Other Ambulatory Visit: Payer: Self-pay

## 2014-09-03 NOTE — Telephone Encounter (Signed)
Pt left v/m requesting rx hydrocodone apap. Call when ready for pick up. Pt last seen 09/02/14 and rx last printed # [id number] on 07/28/14. Pt is almost out of med.Please advise.

## 2014-09-04 ENCOUNTER — Encounter: Payer: Medicare Other | Attending: Surgery | Admitting: Surgery

## 2014-09-04 DIAGNOSIS — E11610 Type 2 diabetes mellitus with diabetic neuropathic arthropathy: Secondary | ICD-10-CM | POA: Diagnosis not present

## 2014-09-04 DIAGNOSIS — L97811 Non-pressure chronic ulcer of other part of right lower leg limited to breakdown of skin: Secondary | ICD-10-CM | POA: Diagnosis not present

## 2014-09-04 DIAGNOSIS — L97919 Non-pressure chronic ulcer of unspecified part of right lower leg with unspecified severity: Secondary | ICD-10-CM | POA: Diagnosis not present

## 2014-09-04 DIAGNOSIS — E11622 Type 2 diabetes mellitus with other skin ulcer: Secondary | ICD-10-CM | POA: Insufficient documentation

## 2014-09-04 DIAGNOSIS — I87331 Chronic venous hypertension (idiopathic) with ulcer and inflammation of right lower extremity: Secondary | ICD-10-CM | POA: Insufficient documentation

## 2014-09-04 DIAGNOSIS — L03115 Cellulitis of right lower limb: Secondary | ICD-10-CM | POA: Insufficient documentation

## 2014-09-04 MED ORDER — HYDROCODONE-ACETAMINOPHEN 5-325 MG PO TABS: 0.5000 | ORAL_TABLET | Freq: Four times a day (QID) | ORAL | Status: DC | PRN

## 2014-09-04 NOTE — Telephone Encounter (Signed)
Printed and in Kim's box 

## 2014-09-04 NOTE — Telephone Encounter (Signed)
Patient notified and Rx placed up front for pick up. 

## 2014-09-05 NOTE — Progress Notes (Signed)
[name], [name] ([id number]) Visit Report for 09/04/2014 Arrival Information Details Patient Name: [name], [name] Date of Service: 09/04/2014 4:00 PM Medical Record Number: [id number] Patient Account Number: [id number] Date of Birth/Sex: April 11, 1927 (81 y.o. Female) Treating RN: Cornell Barman Primary Care Physician: Ria Bush Other Clinician: Referring Physician: Ria Bush Treating Physician/Extender: Frann Rider in Treatment: 2 Visit Information History Since Last Visit Added or deleted any medications: No Patient Arrived: Wheel Chair Any new allergies or adverse reactions: No Arrival Time: 15:56 Had a fall or experienced change in No Accompanied By: self activities of daily living that may affect Transfer Assistance: None risk of falls: Patient Identification Verified: Yes Signs or symptoms of abuse/neglect since last No Secondary Verification Process Yes visito Completed: Hospitalized since last visit: No Patient Has Alerts: Yes Has Dressing in Place as Prescribed: Yes Patient Alerts: Patient on Blood Pain Present Now: Yes Thinner Type II Diabetic 81 MG aspirin Electronic Signature(s) Signed: 09/05/2014 1:28:45 PM By: Gretta Cool, RN, BSN, Kim RN, BSN Entered By: Gretta Cool, RN, BSN, Kim on 09/04/2014 15:57:36 [name], [name] ([id number]) -------------------------------------------------------------------------------- Encounter Discharge Information Details Patient Name: Oh, [name] Date of Service: 09/04/2014 4:00 PM Medical Record Number: [id number] Patient Account Number: [id number] Date of Birth/Sex: May 24, 1927 (81 y.o. Female) Treating RN: Primary Care Physician: Ria Bush Other Clinician: Referring Physician: Ria Bush Treating Physician/Extender: Frann Rider in Treatment: 2 Encounter Discharge Information Items Discharge Pain Level: 0 Discharge Condition: Stable Ambulatory Status: Walker Discharge Destination:  Home Transportation: Private Auto Accompanied By: son Schedule Follow-up Appointment: Yes Medication Reconciliation completed Yes and provided to Patient/Care Kelyn Koskela: Provided on Clinical Summary of Care: 09/04/2014 Form Type Recipient Paper Patient LL Electronic Signature(s) Signed: 09/05/2014 1:28:45 PM By: Gretta Cool RN, BSN, Kim RN, BSN Previous Signature: 09/04/2014 4:33:59 PM Version By: Ruthine Dose Entered By: Gretta Cool RN, BSN, Kim on 09/04/2014 16:55:35 [name], [name] ([id number]) -------------------------------------------------------------------------------- Lower Extremity Assessment Details Patient Name: Liotta, Malayiah C. Date of Service: 09/04/2014 4:00 PM Medical Record Number: [id number] Patient Account Number: [id number] Date of Birth/Sex: 1927/09/03 (81 y.o. Female) Treating RN: Cornell Barman Primary Care Physician: Ria Bush Other Clinician: Referring Physician: Ria Bush Treating Physician/Extender: Frann Rider in Treatment: 2 Edema Assessment Assessed: [Left: No] [Right: No] Edema: [Left: Ye] [Right: s] Calf Left: Right: Point of Measurement: 27 cm From Medial Instep cm 28.6 cm Ankle Left: Right: Point of Measurement: 10 cm From Medial Instep cm 20 cm Vascular Assessment Pulses: Posterior Tibial Dorsalis Pedis Palpable: [Right:Yes] Extremity colors, hair growth, and conditions: Extremity Color: [Right:Red] Temperature of Extremity: [Right:Warm] Capillary Refill: [Right:< 3 seconds] Toe Nail Assessment Left: Right: Thick: No Discolored: No Deformed: No Improper Length and Hygiene: No Electronic Signature(s) Signed: 09/05/2014 1:28:45 PM By: Gretta Cool, RN, BSN, Kim RN, BSN Entered By: Gretta Cool, RN, BSN, Kim on 09/04/2014 16:04:56 [name], [name] ([id number]) -------------------------------------------------------------------------------- Multi Wound Chart Details Patient Name: [name], [name] Date of Service: 09/04/2014 4:00  PM Medical Record Number: [id number] Patient Account Number: [id number] Date of Birth/Sex: 14-Sep-1927 (81 y.o. Female) Treating RN: Cornell Barman Primary Care Physician: Ria Bush Other Clinician: Referring Physician: Ria Bush Treating Physician/Extender: Frann Rider in Treatment: 2 Vital Signs Height(in): 58 Pulse(bpm): 69 Weight(lbs): 151 Blood Pressure 121/51 (mmHg): Body Mass Index(BMI): 32 Temperature(F): 97.9 Respiratory Rate 18 (breaths/min): Photos: [N/A:N/A] Wound Location: Right Lower Leg - Medial N/A N/A Wounding Event: Gradually Appeared N/A N/A Primary Etiology: Venous Leg Ulcer N/A N/A Comorbid History: Cataracts, Anemia, N/A N/A Coronary Artery Disease, Hypertension, Myocardial Infarction, Type  II Diabetes, Osteoarthritis Date Acquired: 04/04/2014 N/A N/A Weeks of Treatment: 2 N/A N/A Wound Status: Open N/A N/A Measurements L x W x D 4.5x3.2x0.2 N/A N/A (cm) Area (cm) : 11.31 N/A N/A Volume (cm) : 2.262 N/A N/A % Reduction in Area: -92.00% N/A N/A % Reduction in Volume: -28.00% N/A N/A Classification: Full Thickness Without N/A N/A Exposed Support Structures HBO Classification: Grade 1 N/A N/A Exudate Amount: Small N/A N/A Exudate Type: Serosanguineous N/A N/A [name], [name] ([id number]) Exudate Color: red, brown N/A N/A Wound Margin: Flat and Intact N/A N/A Granulation Amount: Medium (34-66%) N/A N/A Granulation Quality: Red, Pink N/A N/A Necrotic Amount: Medium (34-66%) N/A N/A Exposed Structures: Fascia: No N/A N/A Fat: No Tendon: No Muscle: No Joint: No Bone: No Limited to Skin Breakdown Epithelialization: Small (1-33%) N/A N/A Periwound Skin Texture: Edema: No N/A N/A Excoriation: No Induration: No Callus: No Crepitus: No Fluctuance: No Friable: No Rash: No Scarring: No Periwound Skin Moist: Yes N/A N/A Moisture: Maceration: No Dry/Scaly: No Periwound Skin Color: Atrophie Blanche: No N/A  N/A Cyanosis: No Ecchymosis: No Erythema: No Hemosiderin Staining: No Mottled: No Pallor: No Rubor: No Temperature: No Abnormality N/A N/A Tenderness on Yes N/A N/A Palpation: Wound Preparation: Ulcer Cleansing: Other: N/A N/A soap and water Topical Anesthetic Applied: Other: lidociane 4% Treatment Notes Electronic Signature(s) Signed: 09/05/2014 1:28:45 PM By: Gretta Cool, RN, BSN, Kim RN, BSN Entered By: Gretta Cool, RN, BSN, Kim on 09/04/2014 16:18:42 Aiken, [name] ([id number]) Wenner, [name] ([id number]) -------------------------------------------------------------------------------- [place] Details Patient Name: Feider, [name] Date of Service: 09/04/2014 4:00 PM Medical Record Number: [id number] Patient Account Number: [id number] Date of Birth/Sex: 01-Sep-1927 (81 y.o. Female) Treating RN: Cornell Barman Primary Care Physician: Ria Bush Other Clinician: Referring Physician: Ria Bush Treating Physician/Extender: Frann Rider in Treatment: 2 Active Inactive Abuse / Safety / Falls / Self Care Management Nursing Diagnoses: Potential for falls Goals: Patient will remain injury free Date Initiated: 08/15/2014 Goal Status: Active Interventions: Assess fall risk on admission and as needed Notes: Nutrition Nursing Diagnoses: Impaired glucose control: actual or potential Goals: Patient/caregiver verbalizes understanding of need to maintain therapeutic glucose control per primary care physician Date Initiated: 08/15/2014 Goal Status: Active Interventions: Assess patient nutrition upon admission and as needed per policy Notes: Orientation to the Wound Care Program Nursing Diagnoses: Knowledge deficit related to the wound healing center program Goals: Patient/caregiver will verbalize understanding of the [place], [name] ([id number]) Date Initiated: 08/15/2014 Goal Status:  Active Interventions: Provide education on orientation to the wound center Notes: Venous Leg Ulcer Nursing Diagnoses: Actual venous Insuffiency (use after diagnosis is confirmed) Goals: Patient will maintain optimal edema control Date Initiated: 08/15/2014 Goal Status: Active Interventions: Compression as ordered Treatment Activities: Test ordered outside of clinic : 09/04/2014 Notes: Wound/Skin Impairment Nursing Diagnoses: Impaired tissue integrity Goals: Patient/caregiver will verbalize understanding of skin care regimen Date Initiated: 08/15/2014 Goal Status: Active Interventions: Assess patient/caregiver ability to obtain necessary supplies Notes: Electronic Signature(s) Signed: 09/05/2014 1:28:45 PM By: Gretta Cool, RN, BSN, Kim RN, BSN Entered By: Gretta Cool, RN, BSN, Kim on 09/04/2014 16:18:24 Calligan, [name] ([id number]) -------------------------------------------------------------------------------- Pain Assessment Details Patient Name: Mormile, [name] Date of Service: 09/04/2014 4:00 PM Medical Record Number: [id number] Patient Account Number: [id number] Date of Birth/Sex: [birth year] (81 y.o. Female) Treating RN: Cornell Barman Primary Care Physician: Ria Bush Other Clinician: Referring Physician: Ria Bush Treating Physician/Extender: Frann Rider in Treatment: 2 Active Problems Location of Pain Severity and Description of Pain  Patient Has [name] Yes Site Locations Pain Location: Pain in Ulcers With Dressing Change: Yes Pain Management and Medication Current Pain Management: Electronic Signature(s) Signed: 09/05/2014 1:28:45 PM By: Gretta Cool, RN, BSN, Kim RN, BSN Entered By: Gretta Cool, RN, BSN, Kim on 09/04/2014 15:57:48 Heick, [name] ([id number]) -------------------------------------------------------------------------------- Patient/Caregiver Education Details Patient Name: Bibby, [name] Date of Service: 09/04/2014 4:00 PM Medical Record Number:  [id number] Patient Account Number: [id number] Date of Birth/Gender: 1927-10-23 (81 y.o. Female) Treating RN: Montey Hora Primary Care Physician: Ria Bush Other Clinician: Referring Physician: Ria Bush Treating Physician/Extender: Frann Rider in Treatment: 2 Education Assessment Education Provided To: Patient Education Topics Provided Venous: Handouts: Other: must comply with compression Methods: Demonstration, Explain/Verbal Responses: State content correctly Electronic Signature(s) Signed: 09/04/2014 5:10:45 PM By: Montey Hora Entered By: Montey Hora on 09/04/2014 16:38:54 Heiland, [name] ([id number]) -------------------------------------------------------------------------------- Wound Assessment Details Patient Name: Cottam, [name] Date of Service: 09/04/2014 4:00 PM Medical Record Number: [id number] Patient Account Number: [id number] Date of Birth/Sex: [birth year] (81 y.o. Female) Treating RN: Cornell Barman Primary Care Physician: Ria Bush Other Clinician: Referring Physician: Ria Bush Treating Physician/Extender: Frann Rider in Treatment: 2 Wound Status Wound Number: 1 Primary Venous Leg Ulcer Etiology: Wound Location: Right Lower Leg - Medial Wound Open Wounding Event: Gradually Appeared Status: Date Acquired: 04/04/2014 Comorbid Cataracts, Anemia, Coronary Artery Weeks Of Treatment: 2 History: Disease, Hypertension, Myocardial Clustered Wound: No Infarction, Type II Diabetes, Osteoarthritis Photos Photo Uploaded By: Gretta Cool, RN, BSN, Kim on 09/04/2014 16:16:32 Wound Measurements Length: (cm) 4.5 Width: (cm) 3.2 Depth: (cm) 0.2 Area: (cm) 11.31 Volume: (cm) 2.262 % Reduction in Area: -92% % Reduction in Volume: -28% Epithelialization: Small (1-33%) Wound Description Full Thickness Without Exposed Foul Odor A Classification: Support Structures Diabetic Severity Grade 1 (Wagner): Wound Margin:  Flat and Intact Exudate Amount: Small Exudate Type: Serosanguineous Exudate Color: red, brown fter Cleansing: No Wound Bed Buford, [name] ([id number]) Granulation Amount: Medium (34-66%) Exposed Structure Granulation Quality: Red, Pink Fascia Exposed: No Necrotic Amount: Medium (34-66%) Fat Layer Exposed: No Necrotic Quality: Adherent Slough Tendon Exposed: No Muscle Exposed: No Joint Exposed: No Bone Exposed: No Limited to Skin Breakdown Periwound Skin Texture Texture Color No Abnormalities Noted: No No Abnormalities Noted: No Callus: No Atrophie Blanche: No Crepitus: No Cyanosis: No Excoriation: No Ecchymosis: No Fluctuance: No Erythema: No Friable: No Hemosiderin Staining: No Induration: No Mottled: No Localized Edema: No Pallor: No Rash: No Rubor: No Scarring: No Temperature / Pain Moisture Temperature: No Abnormality No Abnormalities Noted: No Tenderness on Palpation: Yes Dry / Scaly: No Maceration: No Moist: Yes Wound Preparation Ulcer Cleansing: Other: soap and water, Topical Anesthetic Applied: Other: lidociane 4%, Treatment Notes Wound #1 (Right, Medial Lower Leg) 1. Cleansed with: Cleanse wound with antibacterial soap and water 2. Anesthetic Topical Lidocaine 4% cream to wound bed prior to debridement 4. Dressing Applied: Aquacel Ag 5. Secondary Dressing Applied ABD Pad 7. Secured with 2 Layer Lite Compression System - Right Lower Extremity Notes Applied 2 layer lite with no stretch. [name], [name] ([id number]) Electronic Signature(s) Signed: 09/05/2014 1:28:45 PM By: Gretta Cool, RN, BSN, Kim RN, BSN Entered By: Gretta Cool, RN, BSN, Kim on 09/04/2014 16:07:17 [name], [name] ([id number]) -------------------------------------------------------------------------------- [place] Details Patient Name: Goya, [name] Date of Service: 09/04/2014 4:00 PM Medical Record Number: [id number] Patient Account Number: [id number] Date of Birth/Sex:  [birth year] (81 y.o. Female) Treating RN: Cornell Barman Primary Care Physician: Ria Bush Other Clinician: Referring Physician: Ria Bush Treating Physician/Extender: Frann Rider in Treatment:  2 Vital Signs Time Taken: 15:39 Temperature (F): 97.9 Height (in): 58 Pulse (bpm): 69 Weight (lbs): 151 Respiratory Rate (breaths/min): 18 Body Mass Index (BMI): 31.6 Blood Pressure (mmHg): 121/51 Reference Range: 80 - 120 mg / dl Electronic Signature(s) Signed: 09/05/2014 1:28:45 PM By: Gretta Cool, RN, BSN, Kim RN, BSN Entered By: Gretta Cool, RN, BSN, Kim on 09/04/2014 15:58:19

## 2014-09-05 NOTE — Progress Notes (Signed)
[name], [name] ([id number]) Visit Report for 09/04/2014 Chief Complaint Document Details Patient Name: [name], [name] Date of Service: 09/04/2014 4:00 PM Medical Record Number: [id number] Patient Account Number: [id number] Date of Birth/Sex: 11/12/1927 (81 y.o. Female) Treating RN: Primary Care Physician: Ria Bush Other Clinician: Referring Physician: Ria Bush Treating Physician/Extender: Frann Rider in Treatment: 2 Information Obtained from: Patient Chief Complaint Patient presents for treatment of an open ulcer due to venous insufficiency. 81 year old patient who is known to me from the [facility] wound care center now comes in follow up here for a chronic venous ulceration of her right lower extremity which she's had for about 6 months. Electronic Signature(s) Signed: 09/04/2014 4:40:03 PM By: Christin Fudge MD, FACS Entered By: Christin Fudge on 09/04/2014 16:32:03 [name], [name] ([id number]) -------------------------------------------------------------------------------- Debridement Details Patient Name: [name], [name] Date of Service: 09/04/2014 4:00 PM Medical Record Number: [id number] Patient Account Number: [id number] Date of Birth/Sex: [birth year] (81 y.o. Female) Treating RN: Primary Care Physician: Ria Bush Other Clinician: Referring Physician: Ria Bush Treating Physician/Extender: Frann Rider in Treatment: 2 Debridement Performed for Wound #1 Right,Medial Lower Leg Assessment: Performed By: Physician Pat Patrick., MD Debridement: Open Wound/Selective Debridement Selective Description: Pre-procedure Yes Verification/Time Out Taken: Start Time: 04:20 Pain Control: Lidocaine 5% topical ointment Level: Non-Viable Tissue Total Area Debrided (L x 4.5 (cm) x 3.2 (cm) = 14.4 (cm) W): Tissue and other Non-Viable, Eschar, Exudate, Fibrin/Slough material debrided: Instrument: Curette Bleeding:  Minimum Hemostasis Achieved: Pressure End Time: 04:23 Procedural Pain: 3 Post Procedural Pain: 0 Response to Treatment: Procedure was tolerated well Post Debridement Measurements of Total Wound Length: (cm) 4.5 Width: (cm) 3.2 Depth: (cm) 0.2 Volume: (cm) 2.262 Electronic Signature(s) Signed: 09/04/2014 4:40:03 PM By: Christin Fudge MD, FACS Entered By: Christin Fudge on 09/04/2014 16:31:54 [name], [name] ([id number]) -------------------------------------------------------------------------------- HPI Details Patient Name: [name], [name] Date of Service: 09/04/2014 4:00 PM Medical Record Number: [id number] Patient Account Number: [id number] Date of Birth/Sex: [birth year] (81 y.o. Female) Treating RN: Primary Care Physician: Ria Bush Other Clinician: Referring Physician: Ria Bush Treating Physician/Extender: Frann Rider in Treatment: 2 History of Present Illness Location: ulcer in the right lower extremity for several months Quality: Patient reports experiencing a dull pain to affected area(s). Severity: Patient states wound (s) are getting better. Duration: Patient has had the wound for > 3 months prior to seeking treatment at the wound center Timing: Pain in wound is Intermittent (comes and goes Context: The wound appeared gradually over time Modifying Factors: Consults to this date include: vascular surgery for the veins in December 2015 and she's been seeing the wound care center at [facility] for several months. Associated Signs and Symptoms: Patient reports having difficulty standing for long periods. HPI Description: This is an 81 yrs old female we have been following since the beginning of January for a wound on the right medial ankle. She had previously had venous ablation of the greater saphenous vein by Dr. Kellie Simmering. She came to [place] with erythema of the right leg and subsequently is opened in the medial aspect of the right ankle. She has not  been very compliant with compression. We ordered her at juxta lite stocking, however she apparently has not been able to wear it. The area on the medial aspect of her left ankle has much less induration and erythema. The edema is better controlled. No evidence of infection is noted. 07/18/14; no major change in the condition of this wound. There is still surface slough and probably  some drainage. 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 All other veins showed no evidence of DVT, superficial venous thrombosis or incompetence. however she does have manifestations of chronic venous hypertension and I believe we will change her dressing over to Prisma and a Profore light dressing. she sees her primary care doctor regularly and her last hemoglobin A1c has been around 7%.her other comorbidities are hypertension, CABG in 1998, peptic ulcer disease, history of echo with a ejection fraction of 55-60% and grade 1 diastolic dysfunction. past surgical history include coronary artery bypass graft in 1998, cholecystectomy, appendectomy, hand surgery, endovenous ablation of the saphenous vein with laser by Dr. Kellie Simmering in December 2015. 08/29/2014 - she complains of increased calf pain over the past week. removed her compression bandage and pain improved. no fever or chills. Mild clear drainage. 09/04/2014 -- she has not been compliant and has not done a dressing change as we had ordered last week. She always complains and does not keep her wrap on and this past week she has done the same. She has completed a course of doxycycline. Electronic Signature(s) Signed: 09/04/2014 4:40:03 PM By: Christin Fudge MD, FACS [name], [name] ([id number]) Entered By: Christin Fudge on 09/04/2014 16:33:06 [name], [name]  ([id number]) -------------------------------------------------------------------------------- Physical Exam Details Patient Name: [name], [name] Date of Service: 09/04/2014 4:00 PM Medical Record Number: [id number] Patient Account Number: [id number] Date of Birth/Sex: 1927-11-28 (81 y.o. Female) Treating RN: Primary Care Physician: Ria Bush Other Clinician: Referring Physician: Ria Bush Treating Physician/Extender: Frann Rider in Treatment: 2 Constitutional . Pulse regular. Respirations normal and unlabored. Afebrile. . Eyes Nonicteric. Reactive to light. Ears, Nose, Mouth, and Throat Lips, teeth, and gums WNL.Marland Kitchen Moist mucosa without lesions . Neck supple and nontender. No palpable supraclavicular or cervical adenopathy. Normal sized without goiter. Respiratory WNL. No retractions.. Cardiovascular Pedal Pulses WNL. No clubbing, cyanosis or edema. Integumentary (Hair, Skin) the lower of the 2 wounds on her right lower extremity have some slough and mini CHOP debridement with a curette.Marland Kitchen No crepitus or fluctuance. No peri-wound warmth or erythema. No masses.Marland Kitchen Psychiatric Judgement and insight Intact.. No evidence of depression, anxiety, or agitation.. Electronic Signature(s) Signed: 09/04/2014 4:40:03 PM By: Christin Fudge MD, FACS Entered By: Christin Fudge on 09/04/2014 16:33:42 [name], [name] ([id number]) -------------------------------------------------------------------------------- Physician Orders Details Patient Name: Stebbins, Roman C. Date of Service: 09/04/2014 4:00 PM Medical Record Number: [id number] Patient Account Number: [id number] Date of Birth/Sex: 02-17-1928 (81 y.o. Female) Treating RN: Montey Hora Primary Care Physician: Ria Bush Other Clinician: Referring Physician: Ria Bush Treating Physician/Extender: Frann Rider in Treatment: 2 Verbal / Phone Orders: Yes Clinician: Montey Hora Read Back and  Verified: Yes Diagnosis Coding Wound Cleansing Wound #1 Right,Medial Lower Leg o Cleanse wound with mild soap and water Anesthetic Wound #1 Right,Medial Lower Leg o Topical Lidocaine 4% cream applied to wound bed prior to debridement Primary Wound Dressing Wound #1 Right,Medial Lower Leg o Aquacel Ag Secondary Dressing Wound #1 Right,Medial Lower Leg o ABD pad Dressing Change Frequency Wound #1 Right,Medial Lower Leg o Change dressing every week Follow-up Appointments Wound #1 Right,Medial Lower Leg o Return Appointment in 1 week. Edema Control Wound #1 Right,Medial Lower Leg o 2 Layer Lite Compression System - Right Lower Extremity Additional Orders / Instructions Wound #1 Right,Medial Lower Leg o Increase protein intake. Electronic Signature(s) [name], [name] ([id number]) Signed:  09/04/2014 4:40:03 PM By: Christin Fudge MD, FACS Signed: 09/04/2014 5:10:45 PM By: Montey Hora Entered By: Montey Hora on 09/04/2014 16:26:43 [name], [name] -------------------------------------------------------------------------------- Problem List Details Patient Name: [name], [name] Date of Service: 09/04/2014 4:00 PM Medical Record Number: [id number] Patient Account Number: [id number] Date of Birth/Sex: February 09, 1928 (81 y.o. Female) Treating RN: Primary Care Physician: Ria Bush Other Clinician: Referring Physician: Ria Bush Treating Physician/Extender: Frann Rider in Treatment: 2 Active Problems ICD-10 Encounter Code Description Active Date Diagnosis E11.622 Type 2 diabetes mellitus with other skin ulcer 08/15/2014 Yes E11.610 Type 2 diabetes mellitus with diabetic neuropathic 08/15/2014 Yes arthropathy I87.331 Chronic venous hypertension (idiopathic) with ulcer and 08/15/2014 Yes inflammation of right lower extremity L03.115 Cellulitis of right lower limb 08/29/2014 Yes Inactive Problems Resolved Problems Electronic  Signature(s) Signed: 09/04/2014 4:40:03 PM By: Christin Fudge MD, FACS Entered By: Christin Fudge on 09/04/2014 16:28:58 [name], [name] -------------------------------------------------------------------------------- Progress Note Details Patient Name: [name], [name] Date of Service: 09/04/2014 4:00 PM Medical Record Number: [id number] Patient Account Number: [id number] Date of Birth/Sex: 17-Dec-1927 (81 y.o. Female) Treating RN: Primary Care Physician: Ria Bush Other Clinician: Referring Physician: Ria Bush Treating Physician/Extender: Frann Rider in Treatment: 2 Subjective Chief Complaint Information obtained from Patient Patient presents for treatment of an open ulcer due to venous insufficiency. 81 year old patient who is known to me from the [facility] wound care center now comes in follow up here for a chronic venous ulceration of her right lower extremity which she's had for about 6 months. History of Present Illness (HPI) The following HPI elements were documented for the patient's wound: Location: ulcer in the right lower extremity for several months Quality: Patient reports experiencing a dull pain to affected area(s). Severity: Patient states wound (s) are getting better. Duration: Patient has had the wound for > 3 months prior to seeking treatment at the wound center Timing: Pain in wound is Intermittent (comes and goes Context: The wound appeared gradually over time Modifying Factors: Consults to this date include: vascular surgery for the veins in December 2015 and she's been seeing the wound care center at [facility] for several months. Associated Signs and Symptoms: Patient reports having difficulty standing for long periods. This is an 81 yrs old female we have been following since the beginning of January for a wound on the right medial ankle. She had previously had venous ablation of the greater saphenous vein by Dr. Kellie Simmering.  She came to [place] with erythema of the right leg and subsequently is opened in the medial aspect of the right ankle. She has not been very compliant with compression. We ordered her at juxta lite stocking, however she apparently has not been able to wear it. The area on the medial aspect of her left ankle has much less induration and erythema. The edema is better controlled. No evidence of infection is noted. 07/18/14; no major change in the condition of this wound. There is still surface slough and probably some drainage. 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 All other veins showed no evidence of DVT, superficial venous thrombosis or incompetence. however she does have manifestations of chronic venous hypertension and I believe we will change her dressing over to Prisma and a Profore light dressing. she sees her primary care doctor regularly and her last hemoglobin A1c has been around 7%.her other comorbidities are  hypertension, CABG in 1998, peptic ulcer disease, history of echo with a ejection fraction of 55-60% and grade 1 diastolic dysfunction. past surgical history include coronary artery bypass graft in 1998, cholecystectomy, appendectomy, hand surgery, endovenous ablation of the saphenous vein with laser by Dr. Kellie Simmering in December 2015. DOVIE, QUATTRONE ([id number]) 08/29/2014 - she complains of increased calf pain over the past week. removed her compression bandage and pain improved. no fever or chills. Mild clear drainage. 09/04/2014 -- she has not been compliant and has not done a dressing change as we had ordered last week. She always complains and does not keep her wrap on and this past week she has done the same. She has completed a course of doxycycline. Objective Constitutional Pulse regular. Respirations normal and unlabored. Afebrile. Vitals Time Taken:  3:39 PM, Height: 58 in, Weight: 151 lbs, BMI: 31.6, Temperature: 97.9 F, Pulse: 69 bpm, Respiratory Rate: 18 breaths/min, Blood Pressure: 121/51 mmHg. Eyes Nonicteric. Reactive to light. Ears, Nose, Mouth, and Throat Lips, teeth, and gums WNL.Marland Kitchen Moist mucosa without lesions . Neck supple and nontender. No palpable supraclavicular or cervical adenopathy. Normal sized without goiter. Respiratory WNL. No retractions.. Cardiovascular Pedal Pulses WNL. No clubbing, cyanosis or edema. Psychiatric Judgement and insight Intact.. No evidence of depression, anxiety, or agitation.. Integumentary (Hair, Skin) the lower of the 2 wounds on her right lower extremity have some slough and mini CHOP debridement with a curette.Marland Kitchen No crepitus or fluctuance. No peri-wound warmth or erythema. No masses.. Wound #1 status is Open. Original cause of wound was Gradually Appeared. The wound is located on the Right,Medial Lower Leg. The wound measures 4.5cm length x 3.2cm width x 0.2cm depth; 11.31cm^2 area and 2.262cm^3 volume. The wound is limited to skin breakdown. There is a small amount of serosanguineous drainage noted. The wound margin is flat and intact. There is medium (34-66%) red, pink granulation within the wound bed. There is a medium (34-66%) amount of necrotic tissue within the wound Volpe, Shiquita C. ([id number]) bed including Adherent Slough. The periwound skin appearance exhibited: Moist. The periwound skin appearance did not exhibit: Callus, Crepitus, Excoriation, Fluctuance, Friable, Induration, Localized Edema, Rash, Scarring, Dry/Scaly, Maceration, Atrophie Blanche, Cyanosis, Ecchymosis, Hemosiderin Staining, Mottled, Pallor, Rubor, Erythema. Periwound temperature was noted as No Abnormality. The periwound has tenderness on palpation. Assessment Active Problems ICD-10 E11.622 - Type 2 diabetes mellitus with other skin ulcer E11.610 - Type 2 diabetes mellitus with diabetic neuropathic  arthropathy I87.331 - Chronic venous hypertension (idiopathic) with ulcer and inflammation of right lower extremity L03.115 - Cellulitis of right lower limb I have recommended silver alginate and a cold and light dressing to be left for the week. Spoken to the patient's son regarding her not being compliant and the son says that is not much she can do either because she does not listen to him. After I left the room the patient will not let my nurse applied a Coban light and says that she would prefer a Ace wrap. I have asked her to sign a paper saying that she is not following medical advice and we will wrap her with whatever she allows [place] to do. She will come back and see [place] next week. Procedures Wound #1 Wound #1 is a Venous Leg Ulcer located on the Right,Medial Lower Leg . There was a Non-Viable Tissue Open Wound/Selective [phone]) debridement with total area of 14.4 sq cm performed by Ayde Record, Jackson Latino., MD. with the following instrument(s): Curette to remove Non-Viable tissue/material including Exudate, Fibrin/Slough,  and Eschar after achieving pain control using Lidocaine 5% topical ointment. A time out was conducted prior to the start of the procedure. A Minimum amount of bleeding was controlled with Pressure. The procedure was tolerated well with a pain level of 3 throughout and a pain level of 0 following the procedure. Post Debridement Measurements: 4.5cm length x 3.2cm width x 0.2cm depth; 2.262cm^3 volume. KANDAS, KRAVETS ([id number]) Plan Wound Cleansing: Wound #1 Right,Medial Lower Leg: Cleanse wound with mild soap and water Anesthetic: Wound #1 Right,Medial Lower Leg: Topical Lidocaine 4% cream applied to wound bed prior to debridement Primary Wound Dressing: Wound #1 Right,Medial Lower Leg: Aquacel Ag Secondary Dressing: Wound #1 Right,Medial Lower Leg: ABD pad Dressing Change Frequency: Wound #1 Right,Medial Lower Leg: Change dressing every week Follow-up  Appointments: Wound #1 Right,Medial Lower Leg: Return Appointment in 1 week. Edema Control: Wound #1 Right,Medial Lower Leg: 2 Layer Lite Compression System - Right Lower Extremity Additional Orders / Instructions: Wound #1 Right,Medial Lower Leg: Increase protein intake. I have recommended silver alginate and a cold and light dressing to be left for the week. Spoken to the patient's son regarding her not being compliant and the son says that is not much she can do either because she does not listen to him. After I left the room the patient will not let my nurse applied a Coban light and says that she would prefer a Ace wrap. I have asked her to sign a paper saying that she is not following medical advice and we will wrap her with whatever she allows [place] to do. She will come back and see [place] next week. FOREVER, FUENTE ([id number]) Electronic Signature(s) Signed: 09/04/2014 4:40:03 PM By: Christin Fudge MD, FACS Entered By: Christin Fudge on 09/04/2014 16:35:19 Makarewicz, [name] ([id number]) -------------------------------------------------------------------------------- SuperBill Details Patient Name: Nairn, Dessa C. Date of Service: 09/04/2014 Medical Record Number: [id number] Patient Account Number: [id number] Date of Birth/Sex: Dec 24, 1927 (81 y.o. Female) Treating RN: Montey Hora Primary Care Physician: Ria Bush Other Clinician: Referring Physician: Ria Bush Treating Physician/Extender: Frann Rider in Treatment: 2 Diagnosis Coding ICD-10 Codes Code Description [phone] Type 2 diabetes mellitus with other skin ulcer E11.610 Type 2 diabetes mellitus with diabetic neuropathic arthropathy Chronic venous hypertension (idiopathic) with ulcer and inflammation of right lower I87.331 extremity L03.115 Cellulitis of right lower limb Facility Procedures CPT4: Description Modifier Quantity Code [id number] - DEBRIDE WOUND 1ST 20 SQ CM OR < 1 ICD-10  Description Diagnosis E11.622 Type 2 diabetes mellitus with other skin ulcer E11.610 Type 2 diabetes mellitus with diabetic neuropathic arthropathy I87.331  Chronic venous hypertension (idiopathic) with ulcer and inflammation of right lower extremity Physician Procedures CPT4: Description Modifier Quantity Code [id number] - WC PHYS DEBR WO ANESTH 20 SQ CM 1 ICD-10 Description Diagnosis E11.622 Type 2 diabetes mellitus with other skin ulcer E11.610 Type 2 diabetes mellitus with diabetic neuropathic arthropathy I87.331  Chronic venous hypertension (idiopathic) with ulcer and inflammation of right lower extremity Electronic Signature(s) Signed: 09/04/2014 5:03:20 PM By: Montey Hora Previous Signature: 09/04/2014 4:40:03 PM Version By: Christin Fudge MD, FACS Entered By: Montey Hora on 09/04/2014 17:03:20 Lear, Danicka C. ([id number])

## 2014-09-06 ENCOUNTER — Other Ambulatory Visit: Payer: Self-pay | Admitting: Family Medicine

## 2014-09-06 MED ORDER — FERROUS SULFATE 325 (65 FE) MG PO TABS: 325.0000 mg | ORAL_TABLET | Freq: Every day | ORAL | Status: DC

## 2014-09-08 ENCOUNTER — Encounter: Payer: Self-pay | Admitting: *Deleted

## 2014-09-11 ENCOUNTER — Encounter: Payer: Medicare Other | Admitting: Surgery

## 2014-09-11 DIAGNOSIS — E11622 Type 2 diabetes mellitus with other skin ulcer: Secondary | ICD-10-CM | POA: Diagnosis not present

## 2014-09-11 DIAGNOSIS — L97919 Non-pressure chronic ulcer of unspecified part of right lower leg with unspecified severity: Secondary | ICD-10-CM | POA: Diagnosis not present

## 2014-09-11 DIAGNOSIS — E11610 Type 2 diabetes mellitus with diabetic neuropathic arthropathy: Secondary | ICD-10-CM | POA: Diagnosis not present

## 2014-09-11 DIAGNOSIS — I87331 Chronic venous hypertension (idiopathic) with ulcer and inflammation of right lower extremity: Secondary | ICD-10-CM | POA: Diagnosis not present

## 2014-09-11 DIAGNOSIS — L97811 Non-pressure chronic ulcer of other part of right lower leg limited to breakdown of skin: Secondary | ICD-10-CM | POA: Diagnosis not present

## 2014-09-11 DIAGNOSIS — L03115 Cellulitis of right lower limb: Secondary | ICD-10-CM | POA: Diagnosis not present

## 2014-09-12 NOTE — Progress Notes (Signed)
[name] Visit Report for 09/11/2014 Chief Complaint Document Details Patient Name: [name], [name] Date of Service: 09/11/2014 3:45 PM Medical Record Number: [id number] Patient Account Number: [id number] Date of Birth/Sex: 03-Jul-1927 (81 y.o. Female) Treating RN: Primary Care Physician: Ria Bush Other Clinician: Referring Physician: Ria Bush Treating Physician/Extender: Frann Rider in Treatment: 3 Information Obtained from: Patient Chief Complaint Patient presents for treatment of an open ulcer due to venous insufficiency. 81 year old patient who is known to me from the [facility] wound care center now comes in follow up here for a chronic venous ulceration of her right lower extremity which she's had for about 6 months. Electronic Signature(s) Signed: 09/11/2014 4:41:50 PM By: Christin Fudge MD, FACS Entered By: Christin Fudge on 09/11/2014 16:17:51 [name] -------------------------------------------------------------------------------- HPI Details Patient Name: [name], [name] Date of Service: 09/11/2014 3:45 PM Medical Record Number: [id number] Patient Account Number: [id number] Date of Birth/Sex: 1927/08/25 (81 y.o. Female) Treating RN: Primary Care Physician: Ria Bush Other Clinician: Referring Physician: Ria Bush Treating Physician/Extender: Frann Rider in Treatment: 3 History of Present Illness Location: ulcer in the right lower extremity for several months Quality: Patient reports experiencing a dull pain to affected area(s). Severity: Patient states wound (s) are getting better. Duration: Patient has had the wound for > 3 months prior to seeking treatment at the wound center Timing: Pain in wound is Intermittent (comes and goes Context: The wound appeared gradually over time Modifying Factors: Consults to this date include: vascular surgery for the veins in December 2015  and she's been seeing the wound care center at [facility] for several months. Associated Signs and Symptoms: Patient reports having difficulty standing for long periods. HPI Description: This is an 81 yrs old female we have been following since the beginning of January for a wound on the right medial ankle. She had previously had venous ablation of the greater saphenous vein by Dr. Kellie Simmering. She came to [place] with erythema of the right leg and subsequently is opened in the medial aspect of the right ankle. She has not been very compliant with compression. We ordered her at juxta lite stocking, however she apparently has not been able to wear it. The area on the medial aspect of her left ankle has much less induration and erythema. The edema is better controlled. No evidence of infection is noted. 07/18/14; no major change in the condition of this wound. There is still surface slough and probably some drainage. 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 All other veins showed no evidence of DVT, superficial venous thrombosis or incompetence. however she does have manifestations of chronic venous hypertension and I believe we will change her dressing over to Prisma and a Profore light dressing. she sees her primary care doctor regularly and her last hemoglobin A1c has been around 7%.her other comorbidities are hypertension, CABG in 1998, peptic ulcer disease, history of echo with a ejection fraction of 55-60% and grade 1 diastolic dysfunction. past surgical history include coronary artery bypass graft in 1998, cholecystectomy, appendectomy, hand surgery, endovenous ablation of the saphenous vein with laser by Dr. Kellie Simmering in December 2015. 08/29/2014 - she complains of increased calf pain over the past week. removed her compression bandage and pain improved. no fever or chills. Mild  clear drainage. 09/04/2014 -- she has not been compliant and has not done a  dressing change as we had ordered last week. She always complains and does not keep her wrap on and this past week she has done the same. She has completed a course of doxycycline. Electronic Signature(s) Signed: 09/11/2014 4:41:50 PM By: Christin Fudge MD, FACS [name] Entered By: Christin Fudge on 09/11/2014 16:18:04 [name] -------------------------------------------------------------------------------- Physical Exam Details Patient Name: [name], [name] Date of Service: 09/11/2014 3:45 PM Medical Record Number: [id number] Patient Account Number: [id number] Date of Birth/Sex: 08/23/1927 (81 y.o. Female) Treating RN: Primary Care Physician: Ria Bush Other Clinician: Referring Physician: Ria Bush Treating Physician/Extender: Frann Rider in Treatment: 3 Constitutional . Pulse regular. Respirations normal and unlabored. Afebrile. . Eyes Nonicteric. Reactive to light. Ears, Nose, Mouth, and Throat Lips, teeth, and gums WNL.Marland Kitchen Moist mucosa without lesions . Neck supple and nontender. No palpable supraclavicular or cervical adenopathy. Normal sized without goiter. Respiratory WNL. No retractions.. Cardiovascular Pedal Pulses WNL. significant varicose veins in the right lower extremity and she's got minimal edema all over her foot and lower ankle area.. Integumentary (Hair, Skin) the ulcerated area looks much cleaner and the most superior of the 2 wounds has clean granulation tissue. The more distal wound has some slough and has been gently debrided with a saline gauze.Marland Kitchen No crepitus or fluctuance. No peri-wound warmth or erythema. No masses.Marland Kitchen Psychiatric Judgement and insight Intact.. No evidence of depression, anxiety, or agitation.. Electronic Signature(s) Signed: 09/11/2014 4:41:50 PM By: Christin Fudge MD, FACS Entered By: Christin Fudge on  09/11/2014 16:19:15 [name] -------------------------------------------------------------------------------- Physician Orders Details Patient Name: Cross, Annetta C. Date of Service: 09/11/2014 3:45 PM Medical Record Number: [id number] Patient Account Number: [id number] Date of Birth/Sex: Aug 13, 1927 (81 y.o. Female) Treating RN: Montey Hora Primary Care Physician: Ria Bush Other Clinician: Referring Physician: Ria Bush Treating Physician/Extender: Frann Rider in Treatment: 3 Verbal / Phone Orders: Yes Clinician: Montey Hora Read Back and Verified: Yes Diagnosis Coding Wound Cleansing Wound #1 Right,Medial Lower Leg o Cleanse wound with mild soap and water Anesthetic Wound #1 Right,Medial Lower Leg o Topical Lidocaine 4% cream applied to wound bed prior to debridement Primary Wound Dressing Wound #1 Right,Medial Lower Leg o Aquacel Ag Secondary Dressing Wound #1 Right,Medial Lower Leg o ABD pad Dressing Change Frequency Wound #1 Right,Medial Lower Leg o Change dressing every week Follow-up Appointments Wound #1 Right,Medial Lower Leg o Return Appointment in 1 week. Edema Control Wound #1 Right,Medial Lower Leg o 2 Layer Lite Compression System - Right Lower Extremity Additional Orders / Instructions Wound #1 Right,Medial Lower Leg o Increase protein intake. Notes INDIGO, SIEVERDING ([id number]) see Dr Kellie Simmering - vascular doctor Electronic Signature(s) Signed: 09/11/2014 4:41:50 PM By: Christin Fudge MD, FACS Signed: 09/11/2014 5:03:06 PM By: Montey Hora Entered By: Montey Hora on 09/11/2014 16:11:32 [name] -------------------------------------------------------------------------------- Problem List Details Patient Name: [name], [name] Date of Service: 09/11/2014 3:45 PM Medical Record Number: [id number] Patient Account Number: [id number] Date of Birth/Sex: 04-10-1927 (81 y.o.  Female) Treating RN: Primary Care Physician: Ria Bush Other Clinician: Referring Physician: Ria Bush Treating Physician/Extender: Frann Rider in Treatment: 3 Active Problems ICD-10 Encounter Code Description Active Date Diagnosis E11.622 Type 2 diabetes mellitus with other skin ulcer 08/15/2014 Yes E11.610 Type 2 diabetes mellitus with diabetic neuropathic 08/15/2014 Yes arthropathy I87.331 Chronic venous hypertension (idiopathic) with ulcer and 08/15/2014 Yes inflammation of right lower extremity L03.115 Cellulitis of right lower limb 08/29/2014 Yes Inactive Problems Resolved Problems Electronic Signature(s) Signed: 09/11/2014 4:41:50  PM By: Christin Fudge MD, FACS Entered By: Christin Fudge on 09/11/2014 16:17:44 Dehn, [name] -------------------------------------------------------------------------------- Progress Note Details Patient Name: [name], [name] Date of Service: 09/11/2014 3:45 PM Medical Record Number: [id number] Patient Account Number: [id number] Date of Birth/Sex: 10/31/1927 (81 y.o. Female) Treating RN: Primary Care Physician: Ria Bush Other Clinician: Referring Physician: Ria Bush Treating Physician/Extender: Frann Rider in Treatment: 3 Subjective Chief Complaint Information obtained from Patient Patient presents for treatment of an open ulcer due to venous insufficiency. 81 year old patient who is known to me from the [facility] wound care center now comes in follow up here for a chronic venous ulceration of her right lower extremity which she's had for about 6 months. History of Present Illness (HPI) The following HPI elements were documented for the patient's wound: Location: ulcer in the right lower extremity for several months Quality: Patient reports experiencing a dull pain to affected area(s). Severity: Patient states wound (s) are getting better. Duration: Patient has had the wound  for > 3 months prior to seeking treatment at the wound center Timing: Pain in wound is Intermittent (comes and goes Context: The wound appeared gradually over time Modifying Factors: Consults to this date include: vascular surgery for the veins in December 2015 and she's been seeing the wound care center at [facility] for several months. Associated Signs and Symptoms: Patient reports having difficulty standing for long periods. This is an 81 yrs old female we have been following since the beginning of January for a wound on the right medial ankle. She had previously had venous ablation of the greater saphenous vein by Dr. Kellie Simmering. She came to [place] with erythema of the right leg and subsequently is opened in the medial aspect of the right ankle. She has not been very compliant with compression. We ordered her at juxta lite stocking, however she apparently has not been able to wear it. The area on the medial aspect of her left ankle has much less induration and erythema. The edema is better controlled. No evidence of infection is noted. 07/18/14; no major change in the condition of this wound. There is still surface slough and probably some drainage. 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 All other veins showed no evidence of DVT, superficial venous thrombosis or incompetence. however she does have manifestations of chronic venous hypertension and I believe we will change her dressing over to Prisma and a Profore light dressing. she sees her primary care doctor regularly and her last hemoglobin A1c has been around 7%.her other comorbidities are hypertension, CABG in 1998, peptic ulcer disease, history of echo with a ejection fraction of 55-60% and grade 1 diastolic dysfunction. past surgical history include coronary artery bypass graft in 1998, cholecystectomy, appendectomy,  hand surgery, endovenous ablation of the saphenous vein with laser by Dr. Kellie Simmering in December 2015. [name], [name] 08/29/2014 - she complains of increased calf pain over the past week. removed her compression bandage and pain improved. no fever or chills. Mild clear drainage. 09/04/2014 -- she has not been compliant and has not done a dressing change as we had ordered last week. She always complains and does not keep her wrap on and this past week she has done the same. She has completed a course of doxycycline. Objective Constitutional Pulse regular. Respirations normal and unlabored. Afebrile. Vitals Time Taken: 3:54 PM, Height:  58 in, Weight: 151 lbs, BMI: 31.6, Temperature: 97.9 F, Pulse: 68 bpm, Respiratory Rate: 18 breaths/min, Blood Pressure: 131/53 mmHg. Eyes Nonicteric. Reactive to light. Ears, Nose, Mouth, and Throat Lips, teeth, and gums WNL.Marland Kitchen Moist mucosa without lesions . Neck supple and nontender. No palpable supraclavicular or cervical adenopathy. Normal sized without goiter. Respiratory WNL. No retractions.. Cardiovascular Pedal Pulses WNL. significant varicose veins in the right lower extremity and she's got minimal edema all over her foot and lower ankle area.Marland Kitchen Psychiatric Judgement and insight Intact.. No evidence of depression, anxiety, or agitation.. Integumentary (Hair, Skin) the ulcerated area looks much cleaner and the most superior of the 2 wounds has clean granulation tissue. The more distal wound has some slough and has been gently debrided with a saline gauze.Marland Kitchen No crepitus or fluctuance. No peri-wound warmth or erythema. No masses.. Wound #1 status is Open. Original cause of wound was Gradually Appeared. The wound is located on the Right,Medial Lower Leg. The wound measures 5cm length x 1.6cm width x 0.2cm depth; 6.283cm^2 area and 1.257cm^3 volume. The wound is limited to skin breakdown. There is no tunneling or undermining [name], [name]  C. ([id number]) noted. There is a small amount of serosanguineous drainage noted. The wound margin is flat and intact. There is medium (34-66%) red, pink granulation within the wound bed. There is a medium (34-66%) amount of necrotic tissue within the wound bed including Adherent Slough. The periwound skin appearance exhibited: Moist. The periwound skin appearance did not exhibit: Callus, Crepitus, Excoriation, Fluctuance, Friable, Induration, Localized Edema, Rash, Scarring, Dry/Scaly, Maceration, Atrophie Blanche, Cyanosis, Ecchymosis, Hemosiderin Staining, Mottled, Pallor, Rubor, Erythema. Periwound temperature was noted as No Abnormality. The periwound has tenderness on palpation. The ulcerated area looks much cleaner and the most superior of the 2 wounds has clean granulation tissue. The more distal wound has some slough and has been gently debrided with a saline gauze Assessment Active Problems ICD-10 E11.622 - Type 2 diabetes mellitus with other skin ulcer E11.610 - Type 2 diabetes mellitus with diabetic neuropathic arthropathy I87.331 - Chronic venous hypertension (idiopathic) with ulcer and inflammation of right lower extremity L03.115 - Cellulitis of right lower limb Will continue dressing with silver alginate and a 2 layer compression wrap. I have asked her and have discussed this with this with her son that she needs to go and revisit Dr. Kellie Simmering for a vascular opinion regarding the significant varicose veins of her right lower extremity. She is reluctant to do this but her son says he will try and get an appointment. I have urged her to continue elevations of the leg and keep the 2 layer compression wrap for the week. Plan Wound Cleansing: Wound #1 Right,Medial Lower Leg: Cleanse wound with mild soap and water Anesthetic: Wound #1 Right,Medial Lower Leg: Topical Lidocaine 4% cream applied to wound bed prior to debridement Primary Wound Dressing: Wound #1 Right,Medial Lower  Leg: [name], [name] ([id number]) Aquacel Ag Secondary Dressing: Wound #1 Right,Medial Lower Leg: ABD pad Dressing Change Frequency: Wound #1 Right,Medial Lower Leg: Change dressing every week Follow-up Appointments: Wound #1 Right,Medial Lower Leg: Return Appointment in 1 week. Edema Control: Wound #1 Right,Medial Lower Leg: 2 Layer Lite Compression System - Right Lower Extremity Additional Orders / Instructions: Wound #1 Right,Medial Lower Leg: Increase protein intake. General Notes: see Dr Kellie Simmering - vascular doctor Will continue dressing with silver alginate and a 2 layer compression wrap. I have asked her and have discussed this with this with her son that she needs to  go and revisit Dr. Kellie Simmering for a vascular opinion regarding the significant varicose veins of her right lower extremity. She is reluctant to do this but her son says he will try and get an appointment. I have urged her to continue elevations of the leg and keep the 2 layer compression wrap for the week. Electronic Signature(s) Signed: 09/11/2014 4:41:50 PM By: Christin Fudge MD, FACS Entered By: Christin Fudge on 09/11/2014 16:21:00 [name] ([id number]) -------------------------------------------------------------------------------- SuperBill Details Patient Name: [name], [name] Date of Service: 09/11/2014 Medical Record Number: [id number] Patient Account Number: [id number] Date of Birth/Sex: 1928-02-14 (81 y.o. Female) Treating RN: Montey Hora Primary Care Physician: Ria Bush Other Clinician: Referring Physician: Ria Bush Treating Physician/Extender: Frann Rider in Treatment: 3 Diagnosis Coding ICD-10 Codes Code Description [phone] Type 2 diabetes mellitus with other skin ulcer E11.610 Type 2 diabetes mellitus with diabetic neuropathic arthropathy Chronic venous hypertension (idiopathic) with ulcer and inflammation of right lower I87.331 extremity L03.115 Cellulitis  of right lower limb Facility Procedures CPT4: Description Modifier Quantity Code [id number] (Facility Use Only) [phone] - [place] [id number] LWR RT 1 LEG Physician Procedures CPT4: Description Modifier Quantity Code [id number] - WC PHYS LEVEL 3 - EST PT 1 ICD-10 Description Diagnosis E11.622 Type 2 diabetes mellitus with other skin ulcer I87.331 Chronic venous hypertension (idiopathic) with ulcer and inflammation of right  lower extremity E11.610 Type 2 diabetes mellitus with diabetic neuropathic arthropathy Electronic Signature(s) Signed: 09/11/2014 4:41:50 PM By: Christin Fudge MD, FACS Entered By: Christin Fudge on 09/11/2014 16:21:21

## 2014-09-13 NOTE — Progress Notes (Signed)
[name], [name] ([id number]) Visit Report for 09/11/2014 Arrival Information Details Patient Name: [name], [name] Date of Service: 09/11/2014 3:45 PM Medical Record Number: [id number] Patient Account Number: [id number] Date of Birth/Sex: 05-Nov-1927 (81 y.o. Female) Treating RN: Cornell Barman Primary Care Physician: Ria Bush Other Clinician: Referring Physician: Ria Bush Treating Physician/Extender: Frann Rider in Treatment: 3 Visit Information History Since Last Visit Added or deleted any medications: No Patient Arrived: [name] Any new allergies or adverse reactions: No Arrival Time: 15:50 Had a fall or experienced change in No Accompanied By: self activities of daily living that may affect Transfer Assistance: None risk of falls: Patient Identification Verified: Yes Signs or symptoms of abuse/neglect since last No Secondary Verification Process Yes visito Completed: Hospitalized since last visit: No Patient Has Alerts: Yes Has Dressing in Place as Prescribed: Yes Patient Alerts: Patient on Blood Pain Present Now: No Thinner Type II Diabetic 81 MG aspirin Electronic Signature(s) Signed: 09/12/2014 4:53:24 PM By: Gretta Cool, RN, BSN, Kim RN, BSN Entered By: Gretta Cool, RN, BSN, Kim on 09/11/2014 15:52:30 Stiggers, [name] ([id number]) -------------------------------------------------------------------------------- Encounter Discharge Information Details Patient Name: [name], [name] Date of Service: 09/11/2014 3:45 PM Medical Record Number: [id number] Patient Account Number: [id number] Date of Birth/Sex: 1928/01/17 (81 y.o. Female) Treating RN: Primary Care Physician: Ria Bush Other Clinician: Referring Physician: Ria Bush Treating Physician/Extender: Frann Rider in Treatment: 3 Encounter Discharge Information Items Schedule Follow-up Appointment: No Medication Reconciliation completed No and provided to Patient/Care  Dewanna Hurston: Provided on Clinical Summary of Care: 09/11/2014 Form Type Recipient Paper Patient LL Electronic Signature(s) Signed: 09/11/2014 4:36:37 PM By: Ruthine Dose Previous Signature: 09/11/2014 4:19:47 PM Version By: Ruthine Dose Entered By: Ruthine Dose on 09/11/2014 16:36:36 Kruser, [name] ([id number]) -------------------------------------------------------------------------------- Lower Extremity Assessment Details Patient Name: [name], [name] Date of Service: 09/11/2014 3:45 PM Medical Record Number: [id number] Patient Account Number: [id number] Date of Birth/Sex: 19-Apr-1927 (81 y.o. Female) Treating RN: Cornell Barman Primary Care Physician: Ria Bush Other Clinician: Referring Physician: Ria Bush Treating Physician/Extender: Frann Rider in Treatment: 3 Edema Assessment Assessed: [Left: No] [Right: No] E[Left: dema] [Right: :] Calf Left: Right: Point of Measurement: 27 cm From Medial Instep cm 26.5 cm Ankle Left: Right: Point of Measurement: 10 cm From Medial Instep cm 19.5 cm Vascular Assessment Pulses: Posterior Tibial Dorsalis Pedis Palpable: [Right:Yes] Extremity colors, hair growth, and conditions: Extremity Color: [Right:Mottled] Hair Growth on Extremity: [Right:No] Temperature of Extremity: [Right:Warm] Capillary Refill: [Right:< 3 seconds] Toe Nail Assessment Left: Right: Thick: No Discolored: No Deformed: No Improper Length and Hygiene: No Electronic Signature(s) Signed: 09/12/2014 4:53:24 PM By: Gretta Cool, RN, BSN, Kim RN, BSN Entered By: Gretta Cool, RN, BSN, Kim on 09/11/2014 15:58:46 Sculley, [name] ([id number]) -------------------------------------------------------------------------------- Multi Wound Chart Details Patient Name: [name], [name] Date of Service: 09/11/2014 3:45 PM Medical Record Number: [id number] Patient Account Number: [id number] Date of Birth/Sex: [birth year] (81 y.o. Female) Treating RN: Montey Hora Primary Care Physician: Ria Bush Other Clinician: Referring Physician: Ria Bush Treating Physician/Extender: Frann Rider in Treatment: 3 Vital Signs Height(in): 58 Pulse(bpm): 68 Weight(lbs): 151 Blood Pressure 131/53 (mmHg): Body Mass Index(BMI): 32 Temperature(F): 97.9 Respiratory Rate 18 (breaths/min): Photos: [1:No Photos] [N/A:N/A] Wound Location: [1:Right Lower Leg - Medial] [N/A:N/A] Wounding Event: [1:Gradually Appeared] [N/A:N/A] Primary Etiology: [1:Venous Leg Ulcer] [N/A:N/A] Comorbid History: [1:Cataracts, Anemia, Coronary Artery Disease, Hypertension, Myocardial Infarction, Type II Diabetes, Osteoarthritis] [N/A:N/A] Date Acquired: [1:04/04/2014] [N/A:N/A] Weeks of Treatment: [1:3] [N/A:N/A] Wound Status: [1:Open] [N/A:N/A] Measurements L x W x D 5x1.6x0.2 [N/A:N/A] (  cm) Area (cm) : [1:6.283] [N/A:N/A] Volume (cm) : [1:1.257] [N/A:N/A] % Reduction in Area: [1:-6.70%] [N/A:N/A] % Reduction in Volume: 28.90% [N/A:N/A] Classification: [1:Full Thickness Without Exposed Support Structures] [N/A:N/A] HBO Classification: [1:Grade 1] [N/A:N/A] Exudate Amount: [1:Small] [N/A:N/A] Exudate Type: [1:Serosanguineous] [N/A:N/A] Exudate Color: [1:red, brown] [N/A:N/A] Wound Margin: [1:Flat and Intact] [N/A:N/A] Granulation Amount: [1:Medium (34-66%)] [N/A:N/A] Granulation Quality: [1:Red, Pink] [N/A:N/A] Necrotic Amount: [1:Medium (34-66%)] [N/A:N/A] Exposed Structures: [N/A:N/A] Fascia: No Fat: No Tendon: No Muscle: No Joint: No Bone: No Limited to Skin Breakdown Epithelialization: Small (1-33%) N/A N/A Periwound Skin Texture: Edema: No N/A N/A Excoriation: No Induration: No Callus: No Crepitus: No Fluctuance: No Friable: No Rash: No Scarring: No Periwound Skin Moist: Yes N/A N/A Moisture: Maceration: No Dry/Scaly: No Periwound Skin Color: Atrophie Blanche: No N/A N/A Cyanosis: No Ecchymosis: No Erythema:  No Hemosiderin Staining: No Mottled: No Pallor: No Rubor: No Temperature: No Abnormality N/A N/A Tenderness on Yes N/A N/A Palpation: Wound Preparation: Ulcer Cleansing: Other: N/A N/A soap and water Topical Anesthetic Applied: Other: lidociane 4% Treatment Notes Electronic Signature(s) Signed: 09/11/2014 5:03:06 PM By: Montey Hora Entered By: Montey Hora on 09/11/2014 16:07:25 Lopresti, [name] ([id number]) -------------------------------------------------------------------------------- Multi-Disciplinary Care Plan Details Patient Name: [name], [name] Date of Service: 09/11/2014 3:45 PM Medical Record Number: [id number] Patient Account Number: [id number] Date of Birth/Sex: 11-12-1927 (81 y.o. Female) Treating RN: Montey Hora Primary Care Physician: Ria Bush Other Clinician: Referring Physician: Ria Bush Treating Physician/Extender: Frann Rider in Treatment: 3 Active Inactive Abuse / Safety / Falls / Self Care Management Nursing Diagnoses: Potential for falls Goals: Patient will remain injury free Date Initiated: 08/15/2014 Goal Status: Active Interventions: Assess fall risk on admission and as needed Notes: Nutrition Nursing Diagnoses: Impaired glucose control: actual or potential Goals: Patient/caregiver verbalizes understanding of need to maintain therapeutic glucose control per primary care physician Date Initiated: 08/15/2014 Goal Status: Active Interventions: Assess patient nutrition upon admission and as needed per policy Notes: Orientation to the Wound Care Program Nursing Diagnoses: Knowledge deficit related to the wound healing center program Goals: Patient/caregiver will verbalize understanding of the [place], Ernestene C. ([id number]) Date Initiated: 08/15/2014 Goal Status: Active Interventions: Provide education on orientation to the wound center Notes: Venous Leg Ulcer Nursing  Diagnoses: Actual venous Insuffiency (use after diagnosis is confirmed) Goals: Patient will maintain optimal edema control Date Initiated: 08/15/2014 Goal Status: Active Interventions: Compression as ordered Treatment Activities: Test ordered outside of clinic : 09/11/2014 Notes: Wound/Skin Impairment Nursing Diagnoses: Impaired tissue integrity Goals: Patient/caregiver will verbalize understanding of skin care regimen Date Initiated: 08/15/2014 Goal Status: Active Interventions: Assess patient/caregiver ability to obtain necessary supplies Notes: Electronic Signature(s) Signed: 09/11/2014 5:03:06 PM By: Montey Hora Entered By: Montey Hora on 09/11/2014 16:07:07 Bircher, Dianca C. ([id number]) -------------------------------------------------------------------------------- Pain Assessment Details Patient Name: [name], [name] Date of Service: 09/11/2014 3:45 PM Medical Record Number: [id number] Patient Account Number: [id number] Date of Birth/Sex: Jul 07, 1927 (81 y.o. Female) Treating RN: Cornell Barman Primary Care Physician: Ria Bush Other Clinician: Referring Physician: Ria Bush Treating Physician/Extender: Frann Rider in Treatment: 3 Active Problems Location of Pain Severity and Description of Pain Patient Has [name] Yes Site Locations Pain Location: Pain in Ulcers With Dressing Change: Yes Rate the pain. Current Pain Level: 5 Character of Pain Describe the Pain: Shooting, Throbbing Pain Management and Medication Current Pain Management: Medication: No Cold Application: No Rest: No Massage: No Activity: No T.E.N.S.: No Heat Application: No Leg drop or elevation: No Is the Current Pain  Management Inadequate Adequate: How does your pain impact your activities of daily livingo Sleep: No Bathing: No Appetite: No Relationship With Others: No Bladder Continence: No Emotions: No Bowel Continence: No Work: Yes Toileting: No Drive:  No Dressing: No Hobbies: No Engineer, maintenance) Signed: 09/12/2014 4:53:24 PM By: Gretta Cool, RN, BSN, Kim RN, BSN [name], [name] ([id number]) Entered By: Gretta Cool, RN, BSN, Kim on 09/11/2014 15:53:15 [name], [name] ([id number]) -------------------------------------------------------------------------------- Patient/Caregiver Education Details Patient Name: Loh, Cassandr C. Date of Service: 09/11/2014 3:45 PM Medical Record Number: [id number] Patient Account Number: [id number] Date of Birth/Gender: 09-Oct-1927 (81 y.o. Female) Treating RN: Montey Hora Primary Care Physician: Ria Bush Other Clinician: Referring Physician: Ria Bush Treating Physician/Extender: Frann Rider in Treatment: 3 Education Assessment Education Provided To: Patient Education Topics Provided Wound/Skin Impairment: Handouts: Other: keep wrap on all week again Methods: Explain/Verbal Responses: State content correctly Electronic Signature(s) Signed: 09/11/2014 5:03:06 PM By: Montey Hora Entered By: Montey Hora on 09/11/2014 16:12:28 [name], [name] ([id number]) -------------------------------------------------------------------------------- Wound Assessment Details Patient Name: [name], [name] Date of Service: 09/11/2014 3:45 PM Medical Record Number: [id number] Patient Account Number: [id number] Date of Birth/Sex: 10-22-1927 (81 y.o. Female) Treating RN: Cornell Barman Primary Care Physician: Ria Bush Other Clinician: Referring Physician: Ria Bush Treating Physician/Extender: Frann Rider in Treatment: 3 Wound Status Wound Number: 1 Primary Venous Leg Ulcer Etiology: Wound Location: Right Lower Leg - Medial Wound Open Wounding Event: Gradually Appeared Status: Date Acquired: 04/04/2014 Comorbid Cataracts, Anemia, Coronary Artery Weeks Of Treatment: 3 History: Disease, Hypertension, Myocardial Clustered Wound: No Infarction, Type II  Diabetes, Osteoarthritis Photos Photo Uploaded By: Gretta Cool, RN, BSN, Kim on 09/11/2014 17:15:06 Wound Measurements Length: (cm) 5 Width: (cm) 1.6 Depth: (cm) 0.2 Area: (cm) 6.283 Volume: (cm) 1.257 % Reduction in Area: -6.7% % Reduction in Volume: 28.9% Epithelialization: Small (1-33%) Tunneling: No Undermining: No Wound Description Full Thickness Without Exposed Foul Odor Af Classification: Support Structures Grade 1 [name], [name] ([id number]) ter Cleansing: No Diabetic Severity [name] Newport): Wound Margin: Flat and Intact Exudate Amount: Small Exudate Type: Serosanguineous Exudate Color: red, brown Wound Bed Granulation Amount: Medium (34-66%) Exposed Structure Granulation Quality: Red, Pink Fascia Exposed: No Necrotic Amount: Medium (34-66%) Fat Layer Exposed: No Necrotic Quality: Adherent Slough Tendon Exposed: No Muscle Exposed: No Joint Exposed: No Bone Exposed: No Limited to Skin Breakdown Periwound Skin Texture Texture Color No Abnormalities Noted: No No Abnormalities Noted: No Callus: No Atrophie Blanche: No Crepitus: No Cyanosis: No Excoriation: No Ecchymosis: No Fluctuance: No Erythema: No Friable: No Hemosiderin Staining: No Induration: No Mottled: No Localized Edema: No Pallor: No Rash: No Rubor: No Scarring: No Temperature / Pain Moisture Temperature: No Abnormality No Abnormalities Noted: No Tenderness on Palpation: Yes Dry / Scaly: No Maceration: No Moist: Yes Wound Preparation Ulcer Cleansing: Other: soap and water, Topical Anesthetic Applied: Other: lidociane 4%, Electronic Signature(s) Signed: 09/12/2014 4:53:24 PM By: Gretta Cool, RN, BSN, Kim RN, BSN Entered By: Gretta Cool, RN, BSN, Kim on 09/11/2014 16:01:17 Mihalko, [name] ([id number]) -------------------------------------------------------------------------------- [place] Details Patient Name: Werber, Shala C. Date of Service: 09/11/2014 3:45 PM Medical Record Number:  [id number] Patient Account Number: [id number] Date of Birth/Sex: 06-17-1927 (81 y.o. Female) Treating RN: Cornell Barman Primary Care Physician: Ria Bush Other Clinician: Referring Physician: Ria Bush Treating Physician/Extender: Frann Rider in Treatment: 3 Vital Signs Time Taken: 15:54 Temperature (F): 97.9 Height (in): 58 Pulse (bpm): 68 Weight (lbs): 151 Respiratory Rate (breaths/min): 18 Body Mass Index (BMI): 31.6 Blood Pressure (mmHg): 131/53 Reference Range: 80 - 120  mg / dl Electronic Signature(s) Signed: 09/12/2014 4:53:24 PM By: Gretta Cool, RN, BSN, Kim RN, BSN Entered By: Gretta Cool, RN, BSN, Kim on 09/11/2014 15:55:08

## 2014-09-18 ENCOUNTER — Encounter: Payer: Medicare Other | Admitting: Surgery

## 2014-09-18 DIAGNOSIS — L97919 Non-pressure chronic ulcer of unspecified part of right lower leg with unspecified severity: Secondary | ICD-10-CM | POA: Diagnosis not present

## 2014-09-18 DIAGNOSIS — E11622 Type 2 diabetes mellitus with other skin ulcer: Secondary | ICD-10-CM | POA: Diagnosis not present

## 2014-09-18 DIAGNOSIS — I87331 Chronic venous hypertension (idiopathic) with ulcer and inflammation of right lower extremity: Secondary | ICD-10-CM | POA: Diagnosis not present

## 2014-09-18 DIAGNOSIS — L03115 Cellulitis of right lower limb: Secondary | ICD-10-CM | POA: Diagnosis not present

## 2014-09-18 DIAGNOSIS — L97811 Non-pressure chronic ulcer of other part of right lower leg limited to breakdown of skin: Secondary | ICD-10-CM | POA: Diagnosis not present

## 2014-09-18 DIAGNOSIS — E11610 Type 2 diabetes mellitus with diabetic neuropathic arthropathy: Secondary | ICD-10-CM | POA: Diagnosis not present

## 2014-09-19 ENCOUNTER — Telehealth: Payer: Self-pay | Admitting: *Deleted

## 2014-09-19 NOTE — Progress Notes (Signed)
[name], [name] ([id number]) Visit Report for 09/18/2014 Arrival Information Details Patient Name: [name], [name] Date of Service: 09/18/2014 1:45 PM Medical Record Number: [id number] Patient Account Number: [id number] Date of Birth/Sex: 01/19/1928 (81 y.o. Female) Treating RN: Montey Hora Primary Care Physician: Ria Bush Other Clinician: Referring Physician: Ria Bush Treating Physician/Extender: Frann Rider in Treatment: 4 Visit Information History Since Last Visit Added or deleted any medications: No Patient Arrived: Walker Any new allergies or adverse reactions: No Arrival Time: 14:05 Had a fall or experienced change in No Accompanied By: self activities of daily living that may affect Transfer Assistance: None risk of falls: Patient Identification Verified: Yes Signs or symptoms of abuse/neglect since last No Secondary Verification Process Yes visito Completed: Hospitalized since last visit: No Patient Has Alerts: Yes Pain Present Now: Yes Patient Alerts: Patient on Blood Thinner Type II Diabetic 81 MG aspirin Electronic Signature(s) Signed: 09/18/2014 5:00:03 PM By: Montey Hora Entered By: Montey Hora on 09/18/2014 14:12:16 Iannaccone, [name] ([id number]) -------------------------------------------------------------------------------- Encounter Discharge Information Details Patient Name: [name], [name] Date of Service: 09/18/2014 1:45 PM Medical Record Number: [id number] Patient Account Number: [id number] Date of Birth/Sex: 22-Mar-1928 (81 y.o. Female) Treating RN: Primary Care Physician: Ria Bush Other Clinician: Referring Physician: Ria Bush Treating Physician/Extender: Frann Rider in Treatment: 4 Encounter Discharge Information Items Discharge Pain Level: 0 Discharge Condition: Stable Ambulatory Status: Walker Discharge Destination: Home Transportation: Private Auto Accompanied By:  self Schedule Follow-up Appointment: Yes Medication Reconciliation completed No and provided to Patient/Care Cynithia Hakimi: Provided on Clinical Summary of Care: 09/18/2014 Form Type Recipient Paper Patient LL Electronic Signature(s) Signed: 09/18/2014 4:34:16 PM By: Montey Hora Previous Signature: 09/18/2014 2:51:02 PM Version By: Ruthine Dose Entered By: Montey Hora on 09/18/2014 16:34:15 Simonich, [name] ([id number]) -------------------------------------------------------------------------------- Lower Extremity Assessment Details Patient Name: [name], [name] Date of Service: 09/18/2014 1:45 PM Medical Record Number: [id number] Patient Account Number: [id number] Date of Birth/Sex: 29-Jan-1928 (81 y.o. Female) Treating RN: Montey Hora Primary Care Physician: Ria Bush Other Clinician: Referring Physician: Ria Bush Treating Physician/Extender: Frann Rider in Treatment: 4 Edema Assessment Assessed: [Left: No] [Right: No] E[Left: dema] [Right: :] Calf Left: Right: Point of Measurement: 27 cm From Medial Instep cm 26.4 cm Ankle Left: Right: Point of Measurement: 10 cm From Medial Instep cm 19.7 cm Vascular Assessment Pulses: Posterior Tibial Palpable: [Right:Yes] Dorsalis Pedis Palpable: [Right:Yes] Extremity colors, hair growth, and conditions: Extremity Color: [Right:Mottled] Hair Growth on Extremity: [Right:No] Temperature of Extremity: [Right:Warm] Capillary Refill: [Right:< 3 seconds] Electronic Signature(s) Signed: 09/18/2014 5:00:03 PM By: Montey Hora Entered By: Montey Hora on 09/18/2014 14:25:02 Calvey, Sybel CMarland Kitchen ([id number]) -------------------------------------------------------------------------------- Multi Wound Chart Details Patient Name: [name], [name] Date of Service: 09/18/2014 1:45 PM Medical Record Number: [id number] Patient Account Number: [id number] Date of Birth/Sex: 23-Oct-1927 (81 y.o. Female) Treating RN:  Montey Hora Primary Care Physician: Ria Bush Other Clinician: Referring Physician: Ria Bush Treating Physician/Extender: Frann Rider in Treatment: 4 Vital Signs Height(in): 58 Pulse(bpm): 71 Weight(lbs): 151 Blood Pressure 132/52 (mmHg): Body Mass Index(BMI): 32 Temperature(F): 97.6 Respiratory Rate 18 (breaths/min): Photos: [1:No Photos] [N/A:N/A] Wound Location: [1:Right Lower Leg - Medial] [N/A:N/A] Wounding Event: [1:Gradually Appeared] [N/A:N/A] Primary Etiology: [1:Venous Leg Ulcer] [N/A:N/A] Comorbid History: [1:Cataracts, Anemia, Coronary Artery Disease, Hypertension, Myocardial Infarction, Type II Diabetes, Osteoarthritis] [N/A:N/A] Date Acquired: [1:04/04/2014] [N/A:N/A] Weeks of Treatment: [1:4] [N/A:N/A] Wound Status: [1:Open] [N/A:N/A] Measurements L x W x D 4.3x1.7x0.2 [N/A:N/A] (cm) Area (cm) : [1:5.741] [N/A:N/A] Volume (cm) : [1:1.148] [N/A:N/A] % Reduction in  Area: [1:2.50%] [N/A:N/A] % Reduction in Volume: 35.00% [N/A:N/A] Classification: [1:Full Thickness Without Exposed Support Structures] [N/A:N/A] HBO Classification: [1:Grade 1] [N/A:N/A] Exudate Amount: [1:Small] [N/A:N/A] Exudate Type: [1:Serosanguineous] [N/A:N/A] Exudate Color: [1:red, brown] [N/A:N/A] Wound Margin: [1:Flat and Intact] [N/A:N/A] Granulation Amount: [1:Medium (34-66%)] [N/A:N/A] Granulation Quality: [1:Red, Pink] [N/A:N/A] Necrotic Amount: [1:Medium (34-66%)] [N/A:N/A] Exposed Structures: [N/A:N/A] Fascia: No Fat: No Tendon: No Muscle: No Joint: No Bone: No Limited to Skin Breakdown Epithelialization: Small (1-33%) N/A N/A Periwound Skin Texture: Edema: No N/A N/A Excoriation: No Induration: No Callus: No Crepitus: No Fluctuance: No Friable: No Rash: No Scarring: No Periwound Skin Moist: Yes N/A N/A Moisture: Maceration: No Dry/Scaly: No Periwound Skin Color: Atrophie Blanche: No N/A N/A Cyanosis: No Ecchymosis: No Erythema:  No Hemosiderin Staining: No Mottled: No Pallor: No Rubor: No Temperature: No Abnormality N/A N/A Tenderness on Yes N/A N/A Palpation: Wound Preparation: Ulcer Cleansing: Other: N/A N/A soap and water Topical Anesthetic Applied: Other: lidociane 4% Treatment Notes Electronic Signature(s) Signed: 09/18/2014 5:00:03 PM By: Montey Hora Entered By: Montey Hora on 09/18/2014 14:25:17 Kemnitz, [name] ([id number]) -------------------------------------------------------------------------------- [place] Details Patient Name: Krebbs, Tarissa C. Date of Service: 09/18/2014 1:45 PM Medical Record Number: [id number] Patient Account Number: [id number] Date of Birth/Sex: 1927/07/30 (81 y.o. Female) Treating RN: Montey Hora Primary Care Physician: Ria Bush Other Clinician: Referring Physician: Ria Bush Treating Physician/Extender: Frann Rider in Treatment: 4 Active Inactive Abuse / Safety / Falls / Self Care Management Nursing Diagnoses: Potential for falls Goals: Patient will remain injury free Date Initiated: 08/15/2014 Goal Status: Active Interventions: Assess fall risk on admission and as needed Notes: Nutrition Nursing Diagnoses: Impaired glucose control: actual or potential Goals: Patient/caregiver verbalizes understanding of need to maintain therapeutic glucose control per primary care physician Date Initiated: 08/15/2014 Goal Status: Active Interventions: Assess patient nutrition upon admission and as needed per policy Notes: Orientation to the Wound Care Program Nursing Diagnoses: Knowledge deficit related to the wound healing center program Goals: Patient/caregiver will verbalize understanding of the Castana, Aspynn C. ([id number]) Date Initiated: 08/15/2014 Goal Status: Active Interventions: Provide education on orientation to the wound center Notes: Venous Leg Ulcer Nursing  Diagnoses: Actual venous Insuffiency (use after diagnosis is confirmed) Goals: Patient will maintain optimal edema control Date Initiated: 08/15/2014 Goal Status: Active Interventions: Compression as ordered Treatment Activities: Test ordered outside of clinic : 09/18/2014 Notes: Wound/Skin Impairment Nursing Diagnoses: Impaired tissue integrity Goals: Patient/caregiver will verbalize understanding of skin care regimen Date Initiated: 08/15/2014 Goal Status: Active Interventions: Assess patient/caregiver ability to obtain necessary supplies Notes: Electronic Signature(s) Signed: 09/18/2014 5:00:03 PM By: Montey Hora Entered By: Montey Hora on 09/18/2014 14:25:10 Monterrosa, Palmira C. ([id number]) -------------------------------------------------------------------------------- Pain Assessment Details Patient Name: Salva, Statia C. Date of Service: 09/18/2014 1:45 PM Medical Record Number: [id number] Patient Account Number: [id number] Date of Birth/Sex: Jun 12, 1927 (81 y.o. Female) Treating RN: Montey Hora Primary Care Physician: Ria Bush Other Clinician: Referring Physician: Ria Bush Treating Physician/Extender: Frann Rider in Treatment: 4 Active Problems Location of Pain Severity and Description of Pain Patient Has [name] Yes Site Locations Pain Location: Pain in Ulcers With Dressing Change: Yes Duration of the Pain. Constant / Intermittento Constant Pain Management and Medication Current Pain Management: Electronic Signature(s) Signed: 09/18/2014 5:00:03 PM By: Montey Hora Entered By: Montey Hora on 09/18/2014 14:12:30 Dizon, [name] ([id number]) -------------------------------------------------------------------------------- Patient/Caregiver Education Details Patient Name: Goetzinger, Yaretzy C. Date of Service: 09/18/2014 1:45 PM Medical Record Number: [id number] Patient Account Number: [id number] Date of Birth/Gender:  October 21, 1927 (81  y.o. Female) Treating RN: Montey Hora Primary Care Physician: Ria Bush Other Clinician: Referring Physician: Ria Bush Treating Physician/Extender: Frann Rider in Treatment: 4 Education Assessment Education Provided To: Patient Education Topics Provided Wound/Skin Impairment: Handouts: Other: come in for rewrap if wrap silps Methods: Explain/Verbal Responses: State content correctly Electronic Signature(s) Signed: 09/18/2014 4:34:37 PM By: Montey Hora Entered By: Montey Hora on 09/18/2014 16:34:37 Montesano, [name] ([id number]) -------------------------------------------------------------------------------- Wound Assessment Details Patient Name: Mckiernan, Mayar C. Date of Service: 09/18/2014 1:45 PM Medical Record Number: [id number] Patient Account Number: [id number] Date of Birth/Sex: [birth year] (81 y.o. Female) Treating RN: Montey Hora Primary Care Physician: Ria Bush Other Clinician: Referring Physician: Ria Bush Treating Physician/Extender: Frann Rider in Treatment: 4 Wound Status Wound Number: 1 Primary Venous Leg Ulcer Etiology: Wound Location: Right Lower Leg - Medial Wound Open Wounding Event: Gradually Appeared Status: Date Acquired: 04/04/2014 Comorbid Cataracts, Anemia, Coronary Artery Weeks Of Treatment: 4 History: Disease, Hypertension, Myocardial Clustered Wound: No Infarction, Type II Diabetes, Osteoarthritis Photos Photo Uploaded By: Montey Hora on 09/18/2014 16:27:18 Wound Measurements Length: (cm) 4.3 Width: (cm) 1.7 Depth: (cm) 0.2 Area: (cm) 5.741 Volume: (cm) 1.148 % Reduction in Area: 2.5% % Reduction in Volume: 35% Epithelialization: Small (1-33%) Tunneling: No Undermining: No Wound Description Full Thickness Without Exposed Foul Odor A Classification: Support Structures Diabetic Severity Grade 1 (Wagner): Wound Margin: Flat and Intact Exudate Amount: Small Exudate  Type: Serosanguineous Exudate Color: red, brown fter Cleansing: No Wound Bed Nusz, Braedyn C. ([id number]) Granulation Amount: Medium (34-66%) Exposed Structure Granulation Quality: Red, Pink Fascia Exposed: No Necrotic Amount: Medium (34-66%) Fat Layer Exposed: No Necrotic Quality: Adherent Slough Tendon Exposed: No Muscle Exposed: No Joint Exposed: No Bone Exposed: No Limited to Skin Breakdown Periwound Skin Texture Texture Color No Abnormalities Noted: No No Abnormalities Noted: No Callus: No Atrophie Blanche: No Crepitus: No Cyanosis: No Excoriation: No Ecchymosis: No Fluctuance: No Erythema: No Friable: No Hemosiderin Staining: No Induration: No Mottled: No Localized Edema: No Pallor: No Rash: No Rubor: No Scarring: No Temperature / Pain Moisture Temperature: No Abnormality No Abnormalities Noted: No Tenderness on Palpation: Yes Dry / Scaly: No Maceration: No Moist: Yes Wound Preparation Ulcer Cleansing: Other: soap and water, Topical Anesthetic Applied: Other: lidociane 4%, Treatment Notes Wound #1 (Right, Medial Lower Leg) 1. Cleansed with: Cleanse wound with antibacterial soap and water 2. Anesthetic Topical Lidocaine 4% cream to wound bed prior to debridement 4. Dressing Applied: Aquacel Ag Other dressing (specify in notes) 7. Secured with 2 Layer Lite Compression System - Right Lower Extremity Electronic Signature(s) Signed: 09/18/2014 5:00:03 PM By: Montey Hora Entered By: Montey Hora on 09/18/2014 14:24:22 Graven, [name] ([id number]) Calender, Roselyne Loletha Grayer ([id number]) -------------------------------------------------------------------------------- Vitals Details Patient Name: Bottari, Bailynn C. Date of Service: 09/18/2014 1:45 PM Medical Record Number: [id number] Patient Account Number: [id number] Date of Birth/Sex: 01/27/1928 (81 y.o. Female) Treating RN: Montey Hora Primary Care Physician: Ria Bush Other  Clinician: Referring Physician: Ria Bush Treating Physician/Extender: Frann Rider in Treatment: 4 Vital Signs Time Taken: 14:12 Temperature (F): 97.6 Height (in): 58 Pulse (bpm): 71 Weight (lbs): 151 Respiratory Rate (breaths/min): 18 Body Mass Index (BMI): 31.6 Blood Pressure (mmHg): 132/52 Reference Range: 80 - 120 mg / dl Electronic Signature(s) Signed: 09/18/2014 5:00:03 PM By: Montey Hora Entered By: Montey Hora on 09/18/2014 14:12:52

## 2014-09-19 NOTE — Telephone Encounter (Signed)
Patient called requesting an appt to see Dr Kellie Simmering as the [facility] had suggested.  Her wound is not healing per patient.  Appt was scheduled 09/30/2014.  Patient voiced understanding of the appt date and time.

## 2014-09-19 NOTE — Progress Notes (Signed)
CHANIAH, TOKASH ([id number]) Visit Report for 09/18/2014 Chief Complaint Document Details Patient Name: [name], [name] Date of Service: 09/18/2014 1:45 PM Medical Record Number: [id number] Patient Account Number: [id number] Date of Birth/Sex: May 30, 1927 (81 y.o. Female) Treating RN: Primary Care Physician: Ria Bush Other Clinician: Referring Physician: Ria Bush Treating Physician/Extender: Frann Rider in Treatment: 4 Information Obtained from: Patient Chief Complaint Patient presents for treatment of an open ulcer due to venous insufficiency. 81 year old patient who is known to me from the [facility] wound care center now comes in follow up here for a chronic venous ulceration of her right lower extremity which she's had for about 6 months. Electronic Signature(s) Signed: 09/18/2014 4:07:07 PM By: Christin Fudge MD, FACS Entered By: Christin Fudge on 09/18/2014 14:40:02 Terpstra, [name] ([id number]) -------------------------------------------------------------------------------- HPI Details Patient Name: [name], [name] Date of Service: 09/18/2014 1:45 PM Medical Record Number: [id number] Patient Account Number: [id number] Date of Birth/Sex: Feb 19, 1928 (81 y.o. Female) Treating RN: Primary Care Physician: Ria Bush Other Clinician: Referring Physician: Ria Bush Treating Physician/Extender: Frann Rider in Treatment: 4 History of Present Illness Location: ulcer in the right lower extremity for several months Quality: Patient reports experiencing a dull pain to affected area(s). Severity: Patient states wound (s) are getting better. Duration: Patient has had the wound for > 3 months prior to seeking treatment at the wound center Timing: Pain in wound is Intermittent (comes and goes Context: The wound appeared gradually over time Modifying Factors: Consults to this date include: vascular surgery for the veins in December 2015  and she's been seeing the wound care center at [facility] for several months. Associated Signs and Symptoms: Patient reports having difficulty standing for long periods. HPI Description: This is an 81 yrs old female we have been following since the beginning of January for a wound on the right medial ankle. She had previously had venous ablation of the greater saphenous vein by Dr. Kellie Simmering. She came to [place] with erythema of the right leg and subsequently is opened in the medial aspect of the right ankle. She has not been very compliant with compression. We ordered her at juxta lite stocking, however she apparently has not been able to wear it. The area on the medial aspect of her left ankle has much less induration and erythema. The edema is better controlled. No evidence of infection is noted. 07/18/14; no major change in the condition of this wound. There is still surface slough and probably some drainage. 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 All other veins showed no evidence of DVT, superficial venous thrombosis or incompetence. however she does have manifestations of chronic venous hypertension and I believe we will change her dressing over to Prisma and a Profore light dressing. she sees her primary care doctor regularly and her last hemoglobin A1c has been around 7%.her other comorbidities are hypertension, CABG in 1998, peptic ulcer disease, history of echo with a ejection fraction of 55-60% and grade 1 diastolic dysfunction. past surgical history include coronary artery bypass graft in 1998, cholecystectomy, appendectomy, hand surgery, endovenous ablation of the saphenous vein with laser by Dr. Kellie Simmering in December 2015. 08/29/2014 - she complains of increased calf pain over the past week. removed her compression bandage and pain improved. no fever or chills. Mild  clear drainage. 09/04/2014 -- she has not been compliant and has not done a  dressing change as we had ordered last week. She always complains and does not keep her wrap on and this past week she has done the same. She has completed a course of doxycycline. Electronic Signature(s) Signed: 09/18/2014 4:07:07 PM By: Christin Fudge MD, FACS BOAG, [name] ([id number]) Entered By: Christin Fudge on 09/18/2014 14:40:09 Sahni, [name] ([id number]) -------------------------------------------------------------------------------- Physical Exam Details Patient Name: [name], [name] Date of Service: 09/18/2014 1:45 PM Medical Record Number: [id number] Patient Account Number: [id number] Date of Birth/Sex: 1927-08-28 (81 y.o. Female) Treating RN: Primary Care Physician: Ria Bush Other Clinician: Referring Physician: Ria Bush Treating Physician/Extender: Frann Rider in Treatment: 4 Constitutional . Pulse regular. Respirations normal and unlabored. Afebrile. . Eyes Nonicteric. Reactive to light. Ears, Nose, Mouth, and Throat Lips, teeth, and gums WNL.Marland Kitchen Moist mucosa without lesions . Neck supple and nontender. No palpable supraclavicular or cervical adenopathy. Normal sized without goiter. Respiratory WNL. No retractions.. Cardiovascular Pedal Pulses WNL. continues to have significant varicose veins of the right lower extremity.. Integumentary (Hair, Skin) the superior ulceration is very clean but the low one has some debris which have sharply wiped off with wet saline gauze.Marland Kitchen No crepitus or fluctuance. No peri-wound warmth or erythema. No masses.Marland Kitchen Psychiatric Judgement and insight Intact.. No evidence of depression, anxiety, or agitation.. Electronic Signature(s) Signed: 09/18/2014 4:07:07 PM By: Christin Fudge MD, FACS Entered By: Christin Fudge on 09/18/2014 14:41:02 Okeefe, [name]  ([id number]) -------------------------------------------------------------------------------- Physician Orders Details Patient Name: Otte, Riyana C. Date of Service: 09/18/2014 1:45 PM Medical Record Number: [id number] Patient Account Number: [id number] Date of Birth/Sex: 04/12/1927 (81 y.o. Female) Treating RN: Montey Hora Primary Care Physician: Ria Bush Other Clinician: Referring Physician: Ria Bush Treating Physician/Extender: Frann Rider in Treatment: 4 Verbal / Phone Orders: Yes Clinician: Montey Hora Read Back and Verified: Yes Diagnosis Coding Wound Cleansing Wound #1 Right,Medial Lower Leg o Cleanse wound with mild soap and water Anesthetic Wound #1 Right,Medial Lower Leg o Topical Lidocaine 4% cream applied to wound bed prior to debridement Primary Wound Dressing Wound #1 Right,Medial Lower Leg o Aquacel Ag Secondary Dressing Wound #1 Right,Medial Lower Leg o Drawtex Dressing Change Frequency Wound #1 Right,Medial Lower Leg o Change dressing every week Follow-up Appointments Wound #1 Right,Medial Lower Leg o Return Appointment in 1 week. Edema Control Wound #1 Right,Medial Lower Leg o 2 Layer Lite Compression System - Right Lower Extremity Additional Orders / Instructions Wound #1 Right,Medial Lower Leg o Increase protein intake. Electronic Signature(s) MICHAILA, LORTZ ([id number]) Signed: 09/18/2014 4:07:07 PM By: Christin Fudge MD, FACS Signed: 09/18/2014 5:00:03 PM By: Montey Hora Entered By: Montey Hora on 09/18/2014 14:37:16 Anwar, [name] ([id number]) -------------------------------------------------------------------------------- Problem List Details Patient Name: Schobert, Emmaline C. Date of Service: 09/18/2014 1:45 PM Medical Record Number: [id number] Patient Account Number: [id number] Date of Birth/Sex: 1927-06-08 (81 y.o. Female) Treating RN: Primary Care Physician: Ria Bush Other  Clinician: Referring Physician: Ria Bush Treating Physician/Extender: Frann Rider in Treatment: 4 Active Problems ICD-10 Encounter Code Description Active Date Diagnosis E11.622 Type 2 diabetes mellitus with other skin ulcer 08/15/2014 Yes E11.610 Type 2 diabetes mellitus with diabetic neuropathic 08/15/2014 Yes arthropathy I87.331 Chronic venous hypertension (idiopathic) with ulcer and 08/15/2014 Yes inflammation of right lower extremity L03.115 Cellulitis of right lower limb 08/29/2014 Yes Inactive Problems Resolved Problems Electronic Signature(s) Signed: 09/18/2014 4:07:07 PM By: Christin Fudge MD, FACS Entered By: Christin Fudge on 09/18/2014 14:39:38 Broden, [name] ([id number]) -------------------------------------------------------------------------------- Progress Note Details Patient Name: [name], [name] Date of Service: 09/18/2014  1:45 PM Medical Record Number: [id number] Patient Account Number: [id number] Date of Birth/Sex: Oct 09, 1927 (81 y.o. Female) Treating RN: Primary Care Physician: Ria Bush Other Clinician: Referring Physician: Ria Bush Treating Physician/Extender: Frann Rider in Treatment: 4 Subjective Chief Complaint Information obtained from Patient Patient presents for treatment of an open ulcer due to venous insufficiency. 81 year old patient who is known to me from the [facility] wound care center now comes in follow up here for a chronic venous ulceration of her right lower extremity which she's had for about 6 months. History of Present Illness (HPI) The following HPI elements were documented for the patient's wound: Location: ulcer in the right lower extremity for several months Quality: Patient reports experiencing a dull pain to affected area(s). Severity: Patient states wound (s) are getting better. Duration: Patient has had the wound for > 3 months prior to seeking treatment at the wound center Timing:  Pain in wound is Intermittent (comes and goes Context: The wound appeared gradually over time Modifying Factors: Consults to this date include: vascular surgery for the veins in December 2015 and she's been seeing the wound care center at [facility] for several months. Associated Signs and Symptoms: Patient reports having difficulty standing for long periods. This is an 81 yrs old female we have been following since the beginning of January for a wound on the right medial ankle. She had previously had venous ablation of the greater saphenous vein by Dr. Kellie Simmering. She came to [place] with erythema of the right leg and subsequently is opened in the medial aspect of the right ankle. She has not been very compliant with compression. We ordered her at juxta lite stocking, however she apparently has not been able to wear it. The area on the medial aspect of her left ankle has much less induration and erythema. The edema is better controlled. No evidence of infection is noted. 07/18/14; no major change in the condition of this wound. There is still surface slough and probably some drainage. 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 All other veins showed no evidence of DVT, superficial venous thrombosis or incompetence. however she does have manifestations of chronic venous hypertension and I believe we will change her dressing over to Prisma and a Profore light dressing. she sees her primary care doctor regularly and her last hemoglobin A1c has been around 7%.her other comorbidities are hypertension, CABG in 1998, peptic ulcer disease, history of echo with a ejection fraction of 55-60% and grade 1 diastolic dysfunction. past surgical history include coronary artery bypass graft in 1998, cholecystectomy, appendectomy, hand surgery, endovenous ablation of the saphenous vein with laser by  Dr. Kellie Simmering in December 2015. YODIT, SMALE ([id number]) 08/29/2014 - she complains of increased calf pain over the past week. removed her compression bandage and pain improved. no fever or chills. Mild clear drainage. 09/04/2014 -- she has not been compliant and has not done a dressing change as we had ordered last week. She always complains and does not keep her wrap on and this past week she has done the same. She has completed a course of doxycycline. Objective Constitutional Pulse regular. Respirations normal and unlabored. Afebrile. Vitals Time Taken: 2:12 PM, Height: 58 in, Weight: 151 lbs, BMI: 31.6, Temperature: 97.6 F, Pulse: 71 bpm, Respiratory Rate: 18 breaths/min, Blood Pressure: 132/52 mmHg. Eyes Nonicteric. Reactive to light. Ears, Nose, Mouth, and  Throat Lips, teeth, and gums WNL.Marland Kitchen Moist mucosa without lesions . Neck supple and nontender. No palpable supraclavicular or cervical adenopathy. Normal sized without goiter. Respiratory WNL. No retractions.. Cardiovascular Pedal Pulses WNL. continues to have significant varicose veins of the right lower extremity.Marland Kitchen Psychiatric Judgement and insight Intact.. No evidence of depression, anxiety, or agitation.. Integumentary (Hair, Skin) the superior ulceration is very clean but the low one has some debris which have sharply wiped off with wet saline gauze.Marland Kitchen No crepitus or fluctuance. No peri-wound warmth or erythema. No masses.. Wound #1 status is Open. Original cause of wound was Gradually Appeared. The wound is located on the Right,Medial Lower Leg. The wound measures 4.3cm length x 1.7cm width x 0.2cm depth; 5.741cm^2 area and 1.148cm^3 volume. The wound is limited to skin breakdown. There is no tunneling or undermining noted. There is a small amount of serosanguineous drainage noted. The wound margin is flat and intact. There is medium (34-66%) red, pink granulation within the wound bed. There is a medium (34-66%)  amount Mcswain, [name] ([id number]) of necrotic tissue within the wound bed including Adherent Slough. The periwound skin appearance exhibited: Moist. The periwound skin appearance did not exhibit: Callus, Crepitus, Excoriation, Fluctuance, Friable, Induration, Localized Edema, Rash, Scarring, Dry/Scaly, Maceration, Atrophie Blanche, Cyanosis, Ecchymosis, Hemosiderin Staining, Mottled, Pallor, Rubor, Erythema. Periwound temperature was noted as No Abnormality. The periwound has tenderness on palpation. Assessment Active Problems ICD-10 E11.622 - Type 2 diabetes mellitus with other skin ulcer E11.610 - Type 2 diabetes mellitus with diabetic neuropathic arthropathy I87.331 - Chronic venous hypertension (idiopathic) with ulcer and inflammation of right lower extremity L03.115 - Cellulitis of right lower limb She has failed to get her appointment with Dr. Kellie Simmering the vascular surgeon. I have urged her to do so. We will continue to use silver alginate and a 2 layer compression wrap as she does not tolerate anything stronger. She will come back and see as next week. Plan Wound Cleansing: Wound #1 Right,Medial Lower Leg: Cleanse wound with mild soap and water Anesthetic: Wound #1 Right,Medial Lower Leg: Topical Lidocaine 4% cream applied to wound bed prior to debridement Primary Wound Dressing: Wound #1 Right,Medial Lower Leg: Aquacel Ag Secondary Dressing: Wound #1 Right,Medial Lower Leg: Drawtex Dressing Change Frequency: Wound #1 Right,Medial Lower Leg: Change dressing every week Follow-up Appointments: ARDRA, INSANA ([id number]) Wound #1 Right,Medial Lower Leg: Return Appointment in 1 week. Edema Control: Wound #1 Right,Medial Lower Leg: 2 Layer Lite Compression System - Right Lower Extremity Additional Orders / Instructions: Wound #1 Right,Medial Lower Leg: Increase protein intake. She has failed to get her appointment with Dr. Kellie Simmering the vascular surgeon. I have urged  her to do so. We will continue to use silver alginate and a 2 layer compression wrap as she does not tolerate anything stronger. She will come back and see as next week. Electronic Signature(s) Signed: 09/18/2014 4:07:07 PM By: Christin Fudge MD, FACS Entered By: Christin Fudge on 09/18/2014 14:42:10 Maul, [name] ([id number]) -------------------------------------------------------------------------------- SuperBill Details Patient Name: [name], [name] Date of Service: 09/18/2014 Medical Record Number: [id number] Patient Account Number: [id number] Date of Birth/Sex: Apr 17, 1927 (81 y.o. Female) Treating RN: Primary Care Physician: Ria Bush Other Clinician: Referring Physician: Ria Bush Treating Physician/Extender: Frann Rider in Treatment: 4 Diagnosis Coding ICD-10 Codes Code Description E11.622 Type 2 diabetes mellitus with other skin ulcer E11.610 Type 2 diabetes mellitus with diabetic neuropathic arthropathy Chronic venous hypertension (idiopathic) with ulcer and inflammation of right lower I87.331 extremity L03.115 Cellulitis of right  lower limb Facility Procedures CPT4: Description Modifier Quantity Code [id number] (Facility Use Only) [phone] - [facility] LWR RT 1 LEG Physician Procedures CPT4: Description Modifier Quantity Code [id number] - WC PHYS LEVEL 3 - EST PT 1 ICD-10 Description Diagnosis E11.622 Type 2 diabetes mellitus with other skin ulcer I87.331 Chronic venous hypertension (idiopathic) with ulcer and inflammation of right  lower extremity Electronic Signature(s) Signed: 09/18/2014 4:34:53 PM By: Montey Hora Previous Signature: 09/18/2014 4:07:07 PM Version By: Christin Fudge MD, FACS Entered By: Montey Hora on 09/18/2014 16:34:53

## 2014-09-22 ENCOUNTER — Other Ambulatory Visit: Payer: Self-pay | Admitting: Family Medicine

## 2014-09-22 NOTE — Telephone Encounter (Signed)
My Chart

## 2014-09-23 ENCOUNTER — Ambulatory Visit (INDEPENDENT_AMBULATORY_CARE_PROVIDER_SITE_OTHER): Payer: Medicare Other | Admitting: Cardiovascular Disease

## 2014-09-23 ENCOUNTER — Encounter: Payer: Self-pay | Admitting: Cardiovascular Disease

## 2014-09-23 VITALS — BP 118/62 | HR 73 | Ht <= 58 in | Wt 148.2 lb

## 2014-09-23 DIAGNOSIS — I2510 Atherosclerotic heart disease of native coronary artery without angina pectoris: Secondary | ICD-10-CM

## 2014-09-23 DIAGNOSIS — E785 Hyperlipidemia, unspecified: Secondary | ICD-10-CM | POA: Diagnosis not present

## 2014-09-23 LAB — LIPID PANEL
Cholesterol: 120 mg/dL (ref 0–200)
HDL: 44 mg/dL (ref >39.00)
LDL Cholesterol: 60 mg/dL (ref 0–99)
NonHDL: 76
Total CHOL/HDL Ratio: 3
Triglycerides: 82 mg/dL (ref 0.0–149.0)
VLDL: 16.4 mg/dL (ref 0.0–40.0)

## 2014-09-23 LAB — HEPATIC FUNCTION PANEL
ALT: 13 U/L (ref 0–35)
AST: 18 U/L (ref 0–37)
Albumin: 3.6 g/dL (ref 3.5–5.2)
Alkaline Phosphatase: 112 U/L (ref 39–117)
BILIRUBIN DIRECT: 0.1 mg/dL (ref 0.0–0.3)
BILIRUBIN TOTAL: 0.4 mg/dL (ref 0.2–1.2)
Total Protein: 7.1 g/dL (ref 6.0–8.3)

## 2014-09-23 NOTE — Patient Instructions (Signed)
Medication Instructions:  Your physician recommends that you continue on your current medications as directed. Please refer to the Current Medication list given to you today.   Labwork: TODAY - Cholesterol, Liver   Testing/Procedures: None Ordered   Follow-Up: Your physician wants you to follow-up in: 6 months with Dr. Acie Fredrickson.  You will receive a reminder letter in the mail two months in advance. If you don't receive a letter, please call our office to schedule the follow-up appointment.

## 2014-09-23 NOTE — Progress Notes (Signed)
[place] Date of Birth  12/27/1927 [facility] [address]    [place], [place]  [address] [phone]  Fax  [phone]   Problem list 1. Coronary artery disease-status post CABG in 1996 2. Diabetes mellitus 3. Hypertension 4. Hyperlipidemia 5. Right  DVT.   History of Present Illness:  [name] is an 81 yo female with a Hx of CAD, CABG ( November 09, 1994), diabetes mellitus, anxiety, HTN, hyperlipidemia.   She's been under a lot of stress recently. Her son died last year. She's under a lot of stress with the extra expenses involved such as his funeral. She presented with some CP in December.  A stress myoview revealed lateral ischemia.  We offered cardiac cath but she did not want to have the cath. She presents today without any particular complaints. She has not had any episodes of chest pain or shortness of breath since I saw her a month ago. She does have occasional episodes of tachycardia. She also notes that she has some anxiety. She was last seen in January after having an abnormal stress test that revealed anterior apical ischemia. She refused cardiac catheterization at that time. She has not had any recent episodes of chest pain.  She does not want to have a cardiac catheterization since she's feeling so well.  She has had some chest burning that is nearly constant.  I suggest several times that she should consider getting the cath done and she changed to subject.  September 04, 2012:  [name] is doing well from a cardiac standpoint.  She is still working - despite having lots of back pain.  No CP .  She had some CP several years ago and had a mildly abnormal myoview.  She did not want to have a cardiac cath at that time and has not had any significant epidoses of CP since that time.  She continues to be under lots of stress  She complains of back pain, stomach pain, burning in her legs.    Nov. 24, 2014:  Pt is doing OK from a cardiac standpoint.   Lots of stress - she has been running the [employer] by herself for the past several months.  She is not getting enough rest.  Sleeping 4 1/2 hours at night - tries to get a nap for an hour.    September 09, 2013:  [name] is doing ok.  She is under lots of stress as usual.    Nov. 10, 2015:  [name] presents with various aches and pains.  She has a vericose vein in her leg that bled. She is on Xarelto for a right leg DVT on Sept. 30. .   CT angio a week later was negative for PE.   She has lots of anxiety.  Has lots of chest pain.  The pain is not pleuretic.  The pain is not associated with exercise.  She does not have any pain while she is working but more often occurs with stress.   She is living with 2 elderly men now and has lots of stressful issues relating to that.    This seems to be a major cause of her pain.  She does have 79 year old SVGs  September 23, 2014:  Doing well from a cardiac standpoint. Lots of anxiety .  Has a open right heel ulcer.    Current Outpatient Prescriptions on File Prior to Visit  Medication Sig Dispense Refill  . ALPRAZolam (  XANAX) 1 MG tablet Take 0.5 mg by mouth 4 (four) times daily. For anxiety    . Ascorbic Acid (VITAMIN C PO) Take 1,000 mg by mouth daily.     Marland Kitchen aspirin EC 81 MG tablet Take 81 mg by mouth daily.    Marland Kitchen CALCIUM PO Take 600 mg by mouth daily.     . Cyanocobalamin (VITAMIN B 12 PO) Take 1 tablet by mouth daily.    Marland Kitchen DEXILANT 30 MG capsule TAKE 1 CAPSULE BY MOUTH DAILY 30 capsule 5  . doxycycline (VIBRAMYCIN) 100 MG capsule Take 100 mg by mouth 2 (two) times daily.    . ferrous sulfate 325 (65 FE) MG tablet Take 1 tablet (325 mg total) by mouth daily with breakfast.    . furosemide (LASIX) 20 MG tablet Take 1 tablet (20 mg total) by mouth daily. For leg swelling 30 tablet 6  . glucosamine-chondroitin 500-400 MG tablet Take 1 tablet by mouth daily.     Marland Kitchen HYDROcodone-acetaminophen (NORCO/VICODIN) 5-325 MG per tablet Take 0.5-1 tablets by  mouth every 6 (six) hours as needed for moderate pain. 60 tablet 0  . loperamide (IMODIUM A-D) 2 MG tablet Take 2 mg by mouth 4 (four) times daily as needed for diarrhea or loose stools.    . nitroGLYCERIN (NITROSTAT) 0.4 MG SL tablet Place 0.4 mg under the tongue every 5 (five) minutes as needed. For chest pain May take up to 3 doses, if pain persists call 911.    . nystatin-triamcinolone ointment (MYCOLOG) Apply 1 application topically 2 (two) times daily. As needed for itching 60 g 2  . pioglitazone (ACTOS) 30 MG tablet Take 30 mg by mouth daily. For diabetes    . pravastatin (PRAVACHOL) 20 MG tablet TAKE 1 TABLET BY MOUTH DAILY. 30 tablet 3  . ramipril (ALTACE) 5 MG capsule TAKE 1 CAPSULE BY MOUTH EVERY DAY for blood pressure    . rOPINIRole (REQUIP) 1 MG tablet Take 1 mg by mouth 3 (three) times daily.   0  . traZODone (DESYREL) 150 MG tablet Take 150 mg by mouth at bedtime. For sleep    . triamcinolone cream (KENALOG) 0.1 % Apply 1 application topically daily.    Marland Kitchen VITAMIN E PO Take 1 tablet by mouth daily.      No current facility-administered medications on file prior to visit.    Allergies  Allergen Reactions  . Amoxicillin-Pot Clavulanate Diarrhea  . Metformin And Related Diarrhea    Past Medical History  Diagnosis Date  . Hypertension   . Coronary artery disease     CABG 1998  . Anxiety   . Diastolic dysfunction   . Hyperlipidemia   . PUD (peptic ulcer disease)   . Microcytic anemia   . Depression   . Diabetes mellitus     type 2, followed by endo.  . Diverticulosis   . Hemorrhoids   . History of heart attack   . Irritable bowel syndrome   . Chronic back pain 03/2012    lumbar DDD with herniation - s/p L S1 transforaminal ESI (10/2012 Dr. Sharlet Salina)  . Varicose veins   . Cystocele   . Hx of echocardiogram     a. Echo 10/13: mild LVH, EF 55-60%, Gr 1 diast dysfn, PASP 32  . Acute deep vein thrombosis (DVT) of distal vein of right lower extremity 12/2013    Acute  thrombus of R proximal gastrocnemius vein. Symptomatic provoked distal DVT  . Cellulitis of leg, left 01/16/2014  .  PAD (peripheral artery disease) 2013    ABI: R 0.91, L 0.83  . Chronic venous insufficiency     Past Surgical History  Procedure Laterality Date  . Tympanoplasty  1960s    R side  . Coronary artery bypass graft  1998  . Cataract surgery    . Cholecystectomy    . Appendectomy      per pt report  . Colonoscopy  02/2010    extensive diverticulosis throughout, small internal hemorrhoids, rec rpt 5 yrs (Dr. Allyn Kenner)  . Hand surgery    . Lexiscan myoview  03/2011    Small basal inferolateral and anterolateral reversible perfusion defect suggests ischemia.  EF was normal.   old infarct, no new ischemia  . Toe surgery    . Abdominal hysterectomy  1975    TAH,BSO  . Vault prolapse a & p repair  2009    Dr [facility] hospital  . Dexa scan  09/2011    femur -0.8, forearm 0.6 => normal  . Esi  2014    L S1 transforaminal ESI x2 (Chasnis) without improvement  . Abi  2013    R 0.91, L 0.83  . Endovenous ablation saphenous vein w/ laser Left 03/2014    Kellie Simmering    History  Smoking status  . Former Smoker  . Quit date: 03/05/1979  Smokeless tobacco  . Never Used    History  Alcohol Use No    Family History  Problem Relation Age of Onset  . Hypertension Mother   . Stroke Mother   . Diabetes Father   . Diabetes Sister   . Diabetes Brother   . Colon cancer Neg Hx     Reviw of Systems:  Reviewed in the HPI.  All other systems are negative.  Physical Exam: BP 118/62 mmHg  Pulse 73  Ht 4\' 9"  (1.448 m)  Wt 67.223 kg (148 lb 3.2 oz)  BMI 32.06 kg/m2  SpO2 95% The patient is alert and oriented x 3.  The mood and affect are normal.   Skin: warm and dry.  Color is normal.    HEENT:   Normocephalic/atraumatic. Neck is supple. Her mucous membranes are moist. The JVD. Her carotids are normal.  Lungs: Lung exam is clear.     Heart: Regular rate S1-S2. She has  no murmurs.    Abdomen: Abdomen soft.  Extremities:  Left leg is normal Right leg has 1-2+ edema . Has a heel ulcer    Neuro:  Exam is nonfocal.    ECG: September 23, 2014:  NSR at 67.  No ST or t wave changes   Assessment / Plan:   1. Coronary artery disease-status post CABG in 1996 - she's doing very well. She's not had any episodes of angina. Continue current medications. 2. Diabetes mellitus 3. Hypertension -  blood pressures well-controlled. 4. Hyperlipidemia 5. Right  DVT.  - She has significant edema. This is been managed by her primary medical doctor.  She scheduled to see Dr. Kellie Simmering soon.     Corgan Mormile, Wonda Cheng, MD  09/23/2014 2:20 PM    [place],  [place], [place]  [address] Pager [phone] Phone: [phone]; Fax: [phone]   [facility]  [address] [place],   [address] [phone]    Fax [phone]

## 2014-09-25 ENCOUNTER — Encounter: Payer: Medicare Other | Admitting: Surgery

## 2014-09-25 DIAGNOSIS — E11610 Type 2 diabetes mellitus with diabetic neuropathic arthropathy: Secondary | ICD-10-CM | POA: Diagnosis not present

## 2014-09-25 DIAGNOSIS — L97511 Non-pressure chronic ulcer of other part of right foot limited to breakdown of skin: Secondary | ICD-10-CM | POA: Diagnosis not present

## 2014-09-25 DIAGNOSIS — E11622 Type 2 diabetes mellitus with other skin ulcer: Secondary | ICD-10-CM | POA: Diagnosis not present

## 2014-09-25 DIAGNOSIS — I87331 Chronic venous hypertension (idiopathic) with ulcer and inflammation of right lower extremity: Secondary | ICD-10-CM | POA: Diagnosis not present

## 2014-09-25 DIAGNOSIS — L03115 Cellulitis of right lower limb: Secondary | ICD-10-CM | POA: Diagnosis not present

## 2014-09-25 DIAGNOSIS — L97919 Non-pressure chronic ulcer of unspecified part of right lower leg with unspecified severity: Secondary | ICD-10-CM | POA: Diagnosis not present

## 2014-09-26 ENCOUNTER — Encounter: Payer: Self-pay | Admitting: Vascular Surgery

## 2014-09-26 NOTE — Progress Notes (Signed)
JHOANNA, HELMINSKI ([id number]) Visit Report for 09/25/2014 Arrival Information Details Patient Name: Purifoy, Railey C. Date of Service: 09/25/2014 3:15 PM Medical Record Number: [id number] Patient Account Number: [id number] Date of Birth/Sex: 1927/10/21 (81 y.o. Female) Treating RN: Montey Hora Primary Care Physician: Ria Bush Other Clinician: Referring Physician: Ria Bush Treating Physician/Extender: Frann Rider in Treatment: 5 Visit Information History Since Last Visit Added or deleted any medications: No Patient Arrived: [place] Any new allergies or adverse reactions: No Arrival Time: 15:32 Had a fall or experienced change in No Accompanied By: self activities of daily living that may affect Transfer Assistance: None risk of falls: Patient Identification Verified: Yes Signs or symptoms of abuse/neglect since last No Secondary Verification Process Yes visito Completed: Hospitalized since last visit: No Patient Has Alerts: Yes Pain Present Now: Yes Patient Alerts: Patient on Blood Thinner Type II Diabetic 81 MG aspirin Electronic Signature(s) Signed: 09/25/2014 5:48:32 PM By: Montey Hora Entered By: Montey Hora on 09/25/2014 15:33:22 Vanessen, [name] ([id number]) -------------------------------------------------------------------------------- Encounter Discharge Information Details Patient Name: Reppond, Mercedez C. Date of Service: 09/25/2014 3:15 PM Medical Record Number: [id number] Patient Account Number: [id number] Date of Birth/Sex: 01-09-1928 (81 y.o. Female) Treating RN: Montey Hora Primary Care Physician: Ria Bush Other Clinician: Referring Physician: Ria Bush Treating Physician/Extender: Frann Rider in Treatment: 5 Encounter Discharge Information Items Discharge Pain Level: 0 Discharge Condition: Stable Ambulatory Status: Walker Discharge Destination: Home Transportation: Private Auto Accompanied  By: self Schedule Follow-up Appointment: Yes Medication Reconciliation completed No and provided to Patient/Care Caydon Feasel: Provided on Clinical Summary of Care: 09/25/2014 Form Type Recipient Paper Patient LL Electronic Signature(s) Signed: 09/25/2014 4:18:58 PM By: Ruthine Dose Entered By: Ruthine Dose on 09/25/2014 16:18:58 Farra, [name] ([id number]) -------------------------------------------------------------------------------- Lower Extremity Assessment Details Patient Name: Martis, Ahtziri C. Date of Service: 09/25/2014 3:15 PM Medical Record Number: [id number] Patient Account Number: [id number] Date of Birth/Sex: Jul 17, 1927 (81 y.o. Female) Treating RN: Montey Hora Primary Care Physician: Ria Bush Other Clinician: Referring Physician: Ria Bush Treating Physician/Extender: Frann Rider in Treatment: 5 Edema Assessment Assessed: [Left: No] [Right: No] E[Left: dema] [Right: :] Calf Left: Right: Point of Measurement: 27 cm From Medial Instep cm 26 cm Ankle Left: Right: Point of Measurement: 10 cm From Medial Instep cm 20 cm Vascular Assessment Pulses: Posterior Tibial Palpable: [Right:Yes] Dorsalis Pedis Palpable: [Right:Yes] Extremity colors, hair growth, and conditions: Extremity Color: [Right:Mottled] Hair Growth on Extremity: [Right:No] Temperature of Extremity: [Right:Warm] Capillary Refill: [Right:< 3 seconds] Electronic Signature(s) Signed: 09/25/2014 5:48:32 PM By: Montey Hora Entered By: Montey Hora on 09/25/2014 15:48:42 Pagel, Toniann C. ([id number]) -------------------------------------------------------------------------------- Multi Wound Chart Details Patient Name: Debes, Marthann C. Date of Service: 09/25/2014 3:15 PM Medical Record Number: [id number] Patient Account Number: [id number] Date of Birth/Sex: 14-Feb-1928 (81 y.o. Female) Treating RN: Montey Hora Primary Care Physician: Ria Bush Other  Clinician: Referring Physician: Ria Bush Treating Physician/Extender: Frann Rider in Treatment: 5 Vital Signs Height(in): 58 Pulse(bpm): 69 Weight(lbs): 151 Blood Pressure 135/55 (mmHg): Body Mass Index(BMI): 32 Temperature(F): 97.2 Respiratory Rate 18 (breaths/min): Photos: [1:No Photos] [N/A:N/A] Wound Location: [1:Right Lower Leg - Medial] [N/A:N/A] Wounding Event: [1:Gradually Appeared] [N/A:N/A] Primary Etiology: [1:Venous Leg Ulcer] [N/A:N/A] Comorbid History: [1:Cataracts, Anemia, Coronary Artery Disease, Hypertension, Myocardial Infarction, Type II Diabetes, Osteoarthritis] [N/A:N/A] Date Acquired: [1:04/04/2014] [N/A:N/A] Weeks of Treatment: [1:5] [N/A:N/A] Wound Status: [1:Open] [N/A:N/A] Measurements L x W x D 3.9x2.9x0.2 [N/A:N/A] (cm) Area (cm) : [1:8.883] [N/A:N/A] Volume (cm) : [1:1.777] [N/A:N/A] % Reduction in Area: [1:-50.80%] [N/A:N/A] % Reduction in Volume: -  0.60% [N/A:N/A] Classification: [1:Full Thickness Without Exposed Support Structures] [N/A:N/A] HBO Classification: [1:Grade 1] [N/A:N/A] Exudate Amount: [1:Small] [N/A:N/A] Exudate Type: [1:Serosanguineous] [N/A:N/A] Exudate Color: [1:red, brown] [N/A:N/A] Wound Margin: [1:Flat and Intact] [N/A:N/A] Granulation Amount: [1:Large (67-100%)] [N/A:N/A] Granulation Quality: [1:Red, Pink] [N/A:N/A] Necrotic Amount: [1:Small (1-33%)] [N/A:N/A] Exposed Structures: [N/A:N/A] Fascia: No Fat: No Tendon: No Muscle: No Joint: No Bone: No Limited to Skin Breakdown Epithelialization: Small (1-33%) N/A N/A Periwound Skin Texture: Edema: No N/A N/A Excoriation: No Induration: No Callus: No Crepitus: No Fluctuance: No Friable: No Rash: No Scarring: No Periwound Skin Moist: Yes N/A N/A Moisture: Maceration: No Dry/Scaly: No Periwound Skin Color: Atrophie Blanche: No N/A N/A Cyanosis: No Ecchymosis: No Erythema: No Hemosiderin Staining: No Mottled: No Pallor: No Rubor:  No Temperature: No Abnormality N/A N/A Tenderness on Yes N/A N/A Palpation: Wound Preparation: Ulcer Cleansing: Other: N/A N/A soap and water Topical Anesthetic Applied: Other: lidociane 4% Treatment Notes Electronic Signature(s) Signed: 09/25/2014 5:48:32 PM By: Montey Hora Entered By: Montey Hora on 09/25/2014 15:57:43 Genis, [name] ([id number]) -------------------------------------------------------------------------------- [place] Details Patient Name: Vanacker, Zissy C. Date of Service: 09/25/2014 3:15 PM Medical Record Number: [id number] Patient Account Number: [id number] Date of Birth/Sex: 07/30/1927 (81 y.o. Female) Treating RN: Montey Hora Primary Care Physician: Ria Bush Other Clinician: Referring Physician: Ria Bush Treating Physician/Extender: Frann Rider in Treatment: 5 Active Inactive Abuse / Safety / Falls / Self Care Management Nursing Diagnoses: Potential for falls Goals: Patient will remain injury free Date Initiated: 08/15/2014 Goal Status: Active Interventions: Assess fall risk on admission and as needed Notes: Nutrition Nursing Diagnoses: Impaired glucose control: actual or potential Goals: Patient/caregiver verbalizes understanding of need to maintain therapeutic glucose control per primary care physician Date Initiated: 08/15/2014 Goal Status: Active Interventions: Assess patient nutrition upon admission and as needed per policy Notes: Orientation to the Wound Care Program Nursing Diagnoses: Knowledge deficit related to the wound healing center program Goals: Patient/caregiver will verbalize understanding of the [place]let, Jadene C. ([id number]) Date Initiated: 08/15/2014 Goal Status: Active Interventions: Provide education on orientation to the wound center Notes: Venous Leg Ulcer Nursing Diagnoses: Actual venous Insuffiency (use after diagnosis is  confirmed) Goals: Patient will maintain optimal edema control Date Initiated: 08/15/2014 Goal Status: Active Interventions: Compression as ordered Treatment Activities: Test ordered outside of clinic : 09/25/2014 Notes: Wound/Skin Impairment Nursing Diagnoses: Impaired tissue integrity Goals: Patient/caregiver will verbalize understanding of skin care regimen Date Initiated: 08/15/2014 Goal Status: Active Interventions: Assess patient/caregiver ability to obtain necessary supplies Notes: Electronic Signature(s) Signed: 09/25/2014 5:48:32 PM By: Montey Hora Entered By: Montey Hora on 09/25/2014 15:57:36 Caudillo, Meghan C. ([id number]) -------------------------------------------------------------------------------- Pain Assessment Details Patient Name: Levengood, Kumiko C. Date of Service: 09/25/2014 3:15 PM Medical Record Number: [id number] Patient Account Number: [id number] Date of Birth/Sex: 10/06/1927 (81 y.o. Female) Treating RN: Montey Hora Primary Care Physician: Ria Bush Other Clinician: Referring Physician: Ria Bush Treating Physician/Extender: Frann Rider in Treatment: 5 Active Problems Location of Pain Severity and Description of Pain Patient Has [name] Yes Site Locations Pain Location: Pain in Ulcers With Dressing Change: Yes Duration of the Pain. Constant / Intermittento Constant Pain Management and Medication Current Pain Management: Electronic Signature(s) Signed: 09/25/2014 5:48:32 PM By: Montey Hora Entered By: Montey Hora on 09/25/2014 15:34:06 Sosinski, [name] ([id number]) -------------------------------------------------------------------------------- Patient/Caregiver Education Details Patient Name: Wickersham, Sabina C. Date of Service: 09/25/2014 3:15 PM Medical Record Number: [id number] Patient Account Number: [id number] Date of Birth/Gender: [birth year] (81 y.o. Female) Treating RN: Dorthy,  Di Kindle Primary Care  Physician: Ria Bush Other Clinician: Referring Physician: Ria Bush Treating Physician/Extender: Frann Rider in Treatment: 5 Education Assessment Education Provided To: Patient Education Topics Provided Wound/Skin Impairment: Handouts: Other: wound drainage does not mean infection Methods: Demonstration, Explain/Verbal Responses: State content correctly Electronic Signature(s) Signed: 09/25/2014 3:44:26 PM By: Montey Hora Entered By: Montey Hora on 09/25/2014 15:44:26 Keiffer, Maricela C. ([id number]) -------------------------------------------------------------------------------- Wound Assessment Details Patient Name: Selkirk, Chamara C. Date of Service: 09/25/2014 3:15 PM Medical Record Number: [id number] Patient Account Number: [id number] Date of Birth/Sex: February 09, 1928 (81 y.o. Female) Treating RN: Montey Hora Primary Care Physician: Ria Bush Other Clinician: Referring Physician: Ria Bush Treating Physician/Extender: Frann Rider in Treatment: 5 Wound Status Wound Number: 1 Primary Venous Leg Ulcer Etiology: Wound Location: Right Lower Leg - Medial Wound Open Wounding Event: Gradually Appeared Status: Date Acquired: 04/04/2014 Comorbid Cataracts, Anemia, Coronary Artery Weeks Of Treatment: 5 History: Disease, Hypertension, Myocardial Clustered Wound: No Infarction, Type II Diabetes, Osteoarthritis Photos Photo Uploaded By: Montey Hora on 09/25/2014 17:44:03 Wound Measurements Length: (cm) 3.9 Width: (cm) 2.9 Depth: (cm) 0.2 Area: (cm) 8.883 Volume: (cm) 1.777 % Reduction in Area: -50.8% % Reduction in Volume: -0.6% Epithelialization: Small (1-33%) Tunneling: No Undermining: No Wound Description Full Thickness Without Exposed Foul Odor A Classification: Support Structures Diabetic Severity Grade 1 (Wagner): Wound Margin: Flat and Intact Exudate Amount: Small Exudate Type: Serosanguineous Exudate  Color: red, brown fter Cleansing: No Wound Bed Fenter, Aeisha C. ([id number]) Granulation Amount: Large (67-100%) Exposed Structure Granulation Quality: Red, Pink Fascia Exposed: No Necrotic Amount: Small (1-33%) Fat Layer Exposed: No Necrotic Quality: Adherent Slough Tendon Exposed: No Muscle Exposed: No Joint Exposed: No Bone Exposed: No Limited to Skin Breakdown Periwound Skin Texture Texture Color No Abnormalities Noted: No No Abnormalities Noted: No Callus: No Atrophie Blanche: No Crepitus: No Cyanosis: No Excoriation: No Ecchymosis: No Fluctuance: No Erythema: No Friable: No Hemosiderin Staining: No Induration: No Mottled: No Localized Edema: No Pallor: No Rash: No Rubor: No Scarring: No Temperature / Pain Moisture Temperature: No Abnormality No Abnormalities Noted: No Tenderness on Palpation: Yes Dry / Scaly: No Maceration: No Moist: Yes Wound Preparation Ulcer Cleansing: Other: soap and water, Topical Anesthetic Applied: Other: lidociane 4%, Treatment Notes Wound #1 (Right, Medial Lower Leg) 1. Cleansed with: Cleanse wound with antibacterial soap and water 2. Anesthetic Topical Lidocaine 4% cream to wound bed prior to debridement 4. Dressing Applied: Aquacel Ag Other dressing (specify in notes) 7. Secured with 2 Layer Lite Compression System - Right Lower Extremity Notes drwatex Electronic Signature(s) SIMISOLA, CHRISP ([id number]) Signed: 09/25/2014 5:48:32 PM By: Montey Hora Entered By: Montey Hora on 09/25/2014 15:57:09 [place], [name] ([id number]) -------------------------------------------------------------------------------- Vitals Details Patient Name: Seeley, Dharma C. Date of Service: 09/25/2014 3:15 PM Medical Record Number: [id number] Patient Account Number: [id number] Date of Birth/Sex: 1927/10/07 (81 y.o. Female) Treating RN: Montey Hora Primary Care Physician: Ria Bush Other Clinician: Referring  Physician: Ria Bush Treating Physician/Extender: Frann Rider in Treatment: 5 Vital Signs Time Taken: 15:34 Temperature (F): 97.2 Height (in): 58 Pulse (bpm): 69 Weight (lbs): 151 Respiratory Rate (breaths/min): 18 Body Mass Index (BMI): 31.6 Blood Pressure (mmHg): 135/55 Reference Range: 80 - 120 mg / dl Electronic Signature(s) Signed: 09/25/2014 5:48:32 PM By: Montey Hora Entered By: Montey Hora on 09/25/2014 15:35:26

## 2014-09-26 NOTE — Progress Notes (Signed)
[name] ([id number]) Visit Report for 09/25/2014 Chief Complaint Document Details Patient Name: [name], [name] Date of Service: 09/25/2014 3:15 PM Medical Record Number: [id number] Patient Account Number: [id number] Date of Birth/Sex: 1928-03-06 (81 y.o. Female) Treating RN: Primary Care Physician: Ria Bush Other Clinician: Referring Physician: Ria Bush Treating Physician/Extender: Frann Rider in Treatment: 5 Information Obtained from: Patient Chief Complaint Patient presents for treatment of an open ulcer due to venous insufficiency. 81 year old patient who is known to me from the [facility] wound care center now comes in follow up here for a chronic venous ulceration of her right lower extremity which she's had for about 6 months. Electronic Signature(s) Signed: 09/25/2014 5:00:54 PM By: Christin Fudge MD, FACS Entered By: Christin Fudge on 09/25/2014 16:13:36 [name] ([id number]) -------------------------------------------------------------------------------- HPI Details Patient Name: [name], [name] Date of Service: 09/25/2014 3:15 PM Medical Record Number: [id number] Patient Account Number: [id number] Date of Birth/Sex: 1928/02/13 (81 y.o. Female) Treating RN: Primary Care Physician: Ria Bush Other Clinician: Referring Physician: Ria Bush Treating Physician/Extender: Frann Rider in Treatment: 5 History of Present Illness Location: ulcer in the right lower extremity for several months Quality: Patient reports experiencing a dull pain to affected area(s). Severity: Patient states wound (s) are getting better. Duration: Patient has had the wound for > 3 months prior to seeking treatment at the wound center Timing: Pain in wound is Intermittent (comes and goes Context: The wound appeared gradually over time Modifying Factors: Consults to this date include: vascular surgery for the veins in December 2015  and she's been seeing the wound care center at [facility] for several months. Associated Signs and Symptoms: Patient reports having difficulty standing for long periods. HPI Description: This is an 81 yrs old female we have been following since the beginning of January for a wound on the right medial ankle. She had previously had venous ablation of the greater saphenous vein by Dr. Kellie Simmering. She came to [place] with erythema of the right leg and subsequently is opened in the medial aspect of the right ankle. She has not been very compliant with compression. We ordered her at juxta lite stocking, however she apparently has not been able to wear it. The area on the medial aspect of her left ankle has much less induration and erythema. The edema is better controlled. No evidence of infection is noted. 07/18/14; no major change in the condition of this wound. There is still surface slough and probably some drainage. 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 All other veins showed no evidence of DVT, superficial venous thrombosis or incompetence. however she does have manifestations of chronic venous hypertension and I believe we will change her dressing over to Prisma and a Profore light dressing. she sees her primary care doctor regularly and her last hemoglobin A1c has been around 7%.her other comorbidities are hypertension, CABG in 1998, peptic ulcer disease, history of echo with a ejection fraction of 55-60% and grade 1 diastolic dysfunction. past surgical history include coronary artery bypass graft in 1998, cholecystectomy, appendectomy, hand surgery, endovenous ablation of the saphenous vein with laser by Dr. Kellie Simmering in December 2015. 08/29/2014 - she complains of increased calf pain over the past week. removed her compression bandage and pain improved. no fever or chills. Mild  clear drainage. 09/04/2014 -- she has not been compliant and has not done a  dressing change as we had ordered last week. She always complains and does not keep her wrap on and this past week she has done the same. She has completed a course of doxycycline. 09/25/2014 -- she has tolerated her dressing all week and has not removed it. She says it's hurting a lot today and would not like any debridement. We will do so as per her wishes. she did get a appointment with the vascular surgeon and sees him on 6/28. [name], [name] ([id number]) Electronic Signature(s) Signed: 09/25/2014 5:00:54 PM By: Christin Fudge MD, FACS Entered By: Christin Fudge on 09/25/2014 16:14:34 [name] -------------------------------------------------------------------------------- Physical Exam Details Patient Name: [name], [name] Date of Service: 09/25/2014 3:15 PM Medical Record Number: [id number] Patient Account Number: [id number] Date of Birth/Sex: 03/21/1928 (81 y.o. Female) Treating RN: Primary Care Physician: Ria Bush Other Clinician: Referring Physician: Ria Bush Treating Physician/Extender: Frann Rider in Treatment: 5 Constitutional . Pulse regular. Respirations normal and unlabored. Afebrile. . Eyes Nonicteric. Reactive to light. Ears, Nose, Mouth, and Throat Lips, teeth, and gums WNL.Marland Kitchen Moist mucosa without lesions . Neck supple and nontender. No palpable supraclavicular or cervical adenopathy. Normal sized without goiter. Respiratory WNL. No retractions.. Cardiovascular Pedal Pulses WNL. No clubbing, cyanosis or edema. Musculoskeletal Adexa without tenderness or enlargement.. Digits and nails w/o clubbing, cyanosis, infection, petechiae, ischemia, or inflammatory conditions.. Integumentary (Hair, Skin) the ulceration on the right lower extremity looks clean and the one more superior is got good healthy granulation tissue.Marland Kitchen No crepitus or  fluctuance. No peri-wound warmth or erythema. No masses.. Electronic Signature(s) Signed: 09/25/2014 5:00:54 PM By: Christin Fudge MD, FACS Entered By: Christin Fudge on 09/25/2014 16:15:25 Seipp, Harlow [name] ([id number]) -------------------------------------------------------------------------------- Physician Orders Details Patient Name: [name], [name] Date of Service: 09/25/2014 3:15 PM Medical Record Number: [id number] Patient Account Number: [id number] Date of Birth/Sex: 1927/04/22 (81 y.o. Female) Treating RN: Montey Hora Primary Care Physician: Ria Bush Other Clinician: Referring Physician: Ria Bush Treating Physician/Extender: Frann Rider in Treatment: 5 Verbal / Phone Orders: Yes Clinician: Montey Hora Read Back and Verified: Yes Diagnosis Coding Wound Cleansing Wound #1 Right,Medial Lower Leg o Cleanse wound with mild soap and water Anesthetic Wound #1 Right,Medial Lower Leg o Topical Lidocaine 4% cream applied to wound bed prior to debridement Primary Wound Dressing Wound #1 Right,Medial Lower Leg o Aquacel Ag Secondary Dressing Wound #1 Right,Medial Lower Leg o Drawtex Dressing Change Frequency Wound #1 Right,Medial Lower Leg o Change dressing every week Follow-up Appointments Wound #1 Right,Medial Lower Leg o Return Appointment in 1 week. Edema Control Wound #1 Right,Medial Lower Leg o 2 Layer Lite Compression System - Right Lower Extremity Additional Orders / Instructions Wound #1 Right,Medial Lower Leg o Increase protein intake. Electronic Signature(s) LINETH, CASTERLINE ([id number]) Signed: 09/25/2014 5:00:54 PM By: Christin Fudge MD, FACS Signed: 09/25/2014 5:48:32 PM By: Montey Hora Entered By: Montey Hora on 09/25/2014 15:57:58 Zamarron, Harlow [name] ([id number]) -------------------------------------------------------------------------------- Problem List Details Patient Name: Kernen, Tammey C. Date of  Service: 09/25/2014 3:15 PM Medical Record Number: [id number] Patient Account Number: [id number] Date of Birth/Sex: December 14, 1927 (81 y.o. Female) Treating RN: Primary Care Physician: Ria Bush Other Clinician: Referring Physician: Ria Bush Treating Physician/Extender: Frann Rider in Treatment: 5 Active Problems ICD-10 Encounter Code Description Active Date Diagnosis E11.622 Type 2 diabetes mellitus with other skin ulcer 08/15/2014 Yes E11.610 Type 2 diabetes mellitus with diabetic neuropathic 08/15/2014 Yes arthropathy I87.331 Chronic venous hypertension (idiopathic) with ulcer and 08/15/2014 Yes inflammation of right lower extremity  L03.115 Cellulitis of right lower limb 08/29/2014 Yes Inactive Problems Resolved Problems Electronic Signature(s) Signed: 09/25/2014 5:00:54 PM By: Christin Fudge MD, FACS Entered By: Christin Fudge on 09/25/2014 16:13:27 Copus, Harlow [name] ([id number]) -------------------------------------------------------------------------------- Progress Note Details Patient Name: [name], [name] Date of Service: 09/25/2014 3:15 PM Medical Record Number: [id number] Patient Account Number: [id number] Date of Birth/Sex: 06/18/1927 (81 y.o. Female) Treating RN: Primary Care Physician: Ria Bush Other Clinician: Referring Physician: Ria Bush Treating Physician/Extender: Frann Rider in Treatment: 5 Subjective Chief Complaint Information obtained from Patient Patient presents for treatment of an open ulcer due to venous insufficiency. 81 year old patient who is known to me from the [facility] wound care center now comes in follow up here for a chronic venous ulceration of her right lower extremity which she's had for about 6 months. History of Present Illness (HPI) The following HPI elements were documented for the patient's wound: Location: ulcer in the right lower extremity for several months Quality: Patient reports  experiencing a dull pain to affected area(s). Severity: Patient states wound (s) are getting better. Duration: Patient has had the wound for > 3 months prior to seeking treatment at the wound center Timing: Pain in wound is Intermittent (comes and goes Context: The wound appeared gradually over time Modifying Factors: Consults to this date include: vascular surgery for the veins in December 2015 and she's been seeing the wound care center at [facility] [place] for several months. Associated Signs and Symptoms: Patient reports having difficulty standing for long periods. This is an 81 yrs old female we have been following since the beginning of January for a wound on the right medial ankle. She had previously had venous ablation of the greater saphenous vein by Dr. Kellie Simmering. She came to [place] with erythema of the right leg and subsequently is opened in the medial aspect of the right ankle. She has not been very compliant with compression. We ordered her at juxta lite stocking, however she apparently has not been able to wear it. The area on the medial aspect of her left ankle has much less induration and erythema. The edema is better controlled. No evidence of infection is noted. 07/18/14; no major change in the condition of this wound. There is still surface slough and probably some drainage. 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 All other veins showed no evidence of DVT, superficial venous thrombosis or incompetence. however she does have manifestations of chronic venous hypertension and I believe we will change her dressing over to Prisma and a Profore light dressing. she sees her primary care doctor regularly and her last hemoglobin A1c has been around 7%.her other comorbidities are hypertension, CABG in 1998, peptic ulcer disease, history of echo with a ejection fraction of  55-60% and grade 1 diastolic dysfunction. past surgical history include coronary artery bypass graft in 1998, cholecystectomy, appendectomy, hand surgery, endovenous ablation of the saphenous vein with laser by Dr. Kellie Simmering in December 2015. NORENE, [name] ([id number]) 08/29/2014 - she complains of increased calf pain over the past week. removed her compression bandage and pain improved. no fever or chills. Mild clear drainage. 09/04/2014 -- she has not been compliant and has not done a dressing change as we had ordered last week. She always complains and does not keep her wrap on and this past week she has done the same. She has completed a course  of doxycycline. 09/25/2014 -- she has tolerated her dressing all week and has not removed it. She says it's hurting a lot today and would not like any debridement. We will do so as per her wishes. she did get a appointment with the vascular surgeon and sees him on 6/28. Objective Constitutional Pulse regular. Respirations normal and unlabored. Afebrile. Vitals Time Taken: 3:34 PM, Height: 58 in, Weight: 151 lbs, BMI: 31.6, Temperature: 97.2 F, Pulse: 69 bpm, Respiratory Rate: 18 breaths/min, Blood Pressure: 135/55 mmHg. Eyes Nonicteric. Reactive to light. Ears, Nose, Mouth, and Throat Lips, teeth, and gums WNL.Marland Kitchen Moist mucosa without lesions . Neck supple and nontender. No palpable supraclavicular or cervical adenopathy. Normal sized without goiter. Respiratory WNL. No retractions.. Cardiovascular Pedal Pulses WNL. No clubbing, cyanosis or edema. Musculoskeletal Adexa without tenderness or enlargement.. Digits and nails w/o clubbing, cyanosis, infection, petechiae, ischemia, or inflammatory conditions.. Integumentary (Hair, Skin) the ulceration on the right lower extremity looks clean and the one more superior is got good healthy granulation tissue.Marland Kitchen No crepitus or fluctuance. No peri-wound warmth or erythema. No masses.Marland Kitchen [name], [name]  C. ([id number]) Wound #1 status is Open. Original cause of wound was Gradually Appeared. The wound is located on the Right,Medial Lower Leg. The wound measures 3.9cm length x 2.9cm width x 0.2cm depth; 8.883cm^2 area and 1.777cm^3 volume. The wound is limited to skin breakdown. There is no tunneling or undermining noted. There is a small amount of serosanguineous drainage noted. The wound margin is flat and intact. There is large (67-100%) red, pink granulation within the wound bed. There is a small (1-33%) amount of necrotic tissue within the wound bed including Adherent Slough. The periwound skin appearance exhibited: Moist. The periwound skin appearance did not exhibit: Callus, Crepitus, Excoriation, Fluctuance, Friable, Induration, Localized Edema, Rash, Scarring, Dry/Scaly, Maceration, Atrophie Blanche, Cyanosis, Ecchymosis, Hemosiderin Staining, Mottled, Pallor, Rubor, Erythema. Periwound temperature was noted as No Abnormality. The periwound has tenderness on palpation. Assessment Active Problems ICD-10 E11.622 - Type 2 diabetes mellitus with other skin ulcer E11.610 - Type 2 diabetes mellitus with diabetic neuropathic arthropathy I87.331 - Chronic venous hypertension (idiopathic) with ulcer and inflammation of right lower extremity L03.115 - Cellulitis of right lower limb We will continue with what has worked last week which was silver alginate and a 2 layer light compression. I have asked her to please keep her appointment with the vascular surgeon on 6/28 and come back and see me next week. Plan Wound Cleansing: Wound #1 Right,Medial Lower Leg: Cleanse wound with mild soap and water Anesthetic: Wound #1 Right,Medial Lower Leg: Topical Lidocaine 4% cream applied to wound bed prior to debridement Primary Wound Dressing: Wound #1 Right,Medial Lower Leg: Aquacel Ag Secondary Dressing: Wound #1 Right,Medial Lower Leg: Drawtex Dressing Change Frequency: [name], [name]  ([id number]) Wound #1 Right,Medial Lower Leg: Change dressing every week Follow-up Appointments: Wound #1 Right,Medial Lower Leg: Return Appointment in 1 week. Edema Control: Wound #1 Right,Medial Lower Leg: 2 Layer Lite Compression System - Right Lower Extremity Additional Orders / Instructions: Wound #1 Right,Medial Lower Leg: Increase protein intake. We will continue with what has worked last week which was silver alginate and a 2 layer light compression. I have asked her to please keep her appointment with the vascular surgeon on 6/28 and come back and see me next week. Electronic Signature(s) Signed: 09/25/2014 5:00:54 PM By: Christin Fudge MD, FACS Entered By: Christin Fudge on 09/25/2014 16:16:31 Vanderploeg, Harlow [name] ([id number]) -------------------------------------------------------------------------------- SuperBill Details Patient Name: [name], [name] Date  of Service: 09/25/2014 Medical Record Number: [id number] Patient Account Number: [id number] Date of Birth/Sex: 19-Dec-1927 (81 y.o. Female) Treating RN: Montey Hora Primary Care Physician: Ria Bush Other Clinician: Referring Physician: Ria Bush Treating Physician/Extender: Frann Rider in Treatment: 5 Diagnosis Coding ICD-10 Codes Code Description [phone] Type 2 diabetes mellitus with other skin ulcer E11.610 Type 2 diabetes mellitus with diabetic neuropathic arthropathy Chronic venous hypertension (idiopathic) with ulcer and inflammation of right lower I87.331 extremity L03.115 Cellulitis of right lower limb Facility Procedures CPT4: Description Modifier Quantity Code [id number] (Facility Use Only) [phone] - Dana [id number] LWR RT 1 LEG Physician Procedures CPT4: Description Modifier Quantity Code [id number] - WC PHYS LEVEL 3 - EST PT 1 ICD-10 Description Diagnosis E11.622 Type 2 diabetes mellitus with other skin ulcer E11.610 Type 2 diabetes mellitus with diabetic neuropathic  arthropathy I87.331 Chronic  venous hypertension (idiopathic) with ulcer and inflammation of right lower extremity Electronic Signature(s) Signed: 09/25/2014 5:00:54 PM By: Christin Fudge MD, FACS Entered By: Christin Fudge on 09/25/2014 16:16:52

## 2014-09-30 ENCOUNTER — Encounter: Payer: Self-pay | Admitting: Vascular Surgery

## 2014-09-30 ENCOUNTER — Ambulatory Visit (INDEPENDENT_AMBULATORY_CARE_PROVIDER_SITE_OTHER): Payer: Medicare Other | Admitting: Vascular Surgery

## 2014-09-30 VITALS — BP 133/68 | HR 68 | Resp 16 | Ht <= 58 in | Wt 148.0 lb

## 2014-09-30 DIAGNOSIS — I2510 Atherosclerotic heart disease of native coronary artery without angina pectoris: Secondary | ICD-10-CM | POA: Diagnosis not present

## 2014-09-30 DIAGNOSIS — I83893 Varicose veins of bilateral lower extremities with other complications: Secondary | ICD-10-CM

## 2014-09-30 NOTE — Progress Notes (Signed)
Subjective:     [name], female   DOB: 1927/11/12, 81 y.o.   MRN: [id number]  HPI this 81 year old female was referred for reevaluation by me because of a persistent right venous stasis ulcer. She is being treated by Dr.Britto at the [facility] wound center. Patient is known to me having previously undergone laser ablation of the left great saphenous vein for painful varicosities and a bleeding episode. This was in December 2015. At that time we evaluated her for potential treatment for stasis ulcer in the right leg. Her right great saphenous vein has been removed for coronary artery bypass grafting. The anterior accessory branch was present but no reflux and it was a small-caliber vein. There were no procedures available for this. She had adequate arterial supply. She was at the wound center last week and the right leg has been redressed and she is returning for a follow-up appointment later this week.  Past Medical History  Diagnosis Date  . Hypertension   . Coronary artery disease     CABG 1998  . Anxiety   . Diastolic dysfunction   . Hyperlipidemia   . PUD (peptic ulcer disease)   . Microcytic anemia   . Depression   . Diabetes mellitus     type 2, followed by endo.  . Diverticulosis   . Hemorrhoids   . History of heart attack   . Irritable bowel syndrome   . Chronic back pain 03/2012    lumbar DDD with herniation - s/p L S1 transforaminal ESI (10/2012 Dr. Sharlet Salina)  . Varicose veins   . Cystocele   . Hx of echocardiogram     a. Echo 10/13: mild LVH, EF 55-60%, Gr 1 diast dysfn, PASP 32  . Acute deep vein thrombosis (DVT) of distal vein of right lower extremity 12/2013    Acute thrombus of R proximal gastrocnemius vein. Symptomatic provoked distal DVT  . Cellulitis of leg, left 01/16/2014  . PAD (peripheral artery disease) 2013    ABI: R 0.91, L 0.83  . Chronic venous insufficiency     History  Substance Use Topics  . Smoking status: Former Smoker    Quit  date: 03/05/1979  . Smokeless tobacco: Never Used  . Alcohol Use: No    Family History  Problem Relation Age of Onset  . Hypertension Mother   . Stroke Mother   . Diabetes Father   . Diabetes Sister   . Diabetes Brother   . Colon cancer Neg Hx     Allergies  Allergen Reactions  . Amoxicillin-Pot Clavulanate Diarrhea  . Metformin And Related Diarrhea     Current outpatient prescriptions:  .  ALPRAZolam (XANAX) 1 MG tablet, Take 0.5 mg by mouth 4 (four) times daily. For anxiety, Disp: , Rfl:  .  Ascorbic Acid (VITAMIN C PO), Take 1,000 mg by mouth daily. , Disp: , Rfl:  .  aspirin EC 81 MG tablet, Take 81 mg by mouth daily., Disp: , Rfl:  .  CALCIUM PO, Take 600 mg by mouth daily. , Disp: , Rfl:  .  Cyanocobalamin (VITAMIN B 12 PO), Take 1 tablet by mouth daily., Disp: , Rfl:  .  DEXILANT 30 MG capsule, TAKE 1 CAPSULE BY MOUTH DAILY, Disp: 30 capsule, Rfl: 5 .  doxycycline (VIBRAMYCIN) 100 MG capsule, Take 100 mg by mouth 2 (two) times daily., Disp: , Rfl:  .  ferrous sulfate 325 (65 FE) MG tablet, Take 1 tablet (325 mg total) by mouth  daily with breakfast., Disp: , Rfl:  .  furosemide (LASIX) 20 MG tablet, Take 1 tablet (20 mg total) by mouth daily. For leg swelling, Disp: 30 tablet, Rfl: 6 .  glucosamine-chondroitin 500-400 MG tablet, Take 1 tablet by mouth daily. , Disp: , Rfl:  .  HYDROcodone-acetaminophen (NORCO/VICODIN) 5-325 MG per tablet, Take 0.5-1 tablets by mouth every 6 (six) hours as needed for moderate pain., Disp: 60 tablet, Rfl: 0 .  loperamide (IMODIUM A-D) 2 MG tablet, Take 2 mg by mouth 4 (four) times daily as needed for diarrhea or loose stools., Disp: , Rfl:  .  nitroGLYCERIN (NITROSTAT) 0.4 MG SL tablet, Place 0.4 mg under the tongue every 5 (five) minutes as needed. For chest pain May take up to 3 doses, if pain persists call 911., Disp: , Rfl:  .  nystatin-triamcinolone ointment (MYCOLOG), Apply 1 application topically 2 (two) times daily. As needed for  itching, Disp: 60 g, Rfl: 2 .  pioglitazone (ACTOS) 30 MG tablet, Take 30 mg by mouth daily. For diabetes, Disp: , Rfl:  .  pravastatin (PRAVACHOL) 20 MG tablet, TAKE 1 TABLET BY MOUTH DAILY., Disp: 30 tablet, Rfl: 3 .  ramipril (ALTACE) 5 MG capsule, TAKE 1 CAPSULE BY MOUTH EVERY DAY for blood pressure, Disp: , Rfl:  .  rOPINIRole (REQUIP) 1 MG tablet, Take 1 mg by mouth 3 (three) times daily. , Disp: , Rfl: 0 .  traZODone (DESYREL) 150 MG tablet, Take 150 mg by mouth at bedtime. For sleep, Disp: , Rfl:  .  triamcinolone cream (KENALOG) 0.1 %, Apply 1 application topically daily., Disp: , Rfl:  .  VITAMIN E PO, Take 1 tablet by mouth daily. , Disp: , Rfl:   Filed Vitals:   09/30/14 0856  BP: 133/68  Pulse: 68  Resp: 16  Height: 4\' 10"  (1.473 m)  Weight: 148 lb (67.132 kg)    Body mass index is 30.94 kg/(m^2).         Review of Systems denies active chest pain or dyspnea on exertion. Walks with a walker.    Objective:   Physical Exam BP 133/68 mmHg  Pulse 68  Resp 16  Ht 4\' 10"  (1.473 m)  Wt 148 lb (67.132 kg)  BMI 30.94 kg/m2  Dental elderly female in no apparent distress-frail in appearance walking with walker Lungs no rhonchi or wheezing Right leg has dressing from knee to ankle area and ulceration according to patient as on the right medial malleolar area but was not visualized by me today since dressing has been changed wound center she was instructed to leave the dressing in place. Left leg is free of ulcerations.     Assessment:     I reviewed the previous venous duplex exam study performed in September 2015 which reveals no options for treating this venous ulcer. There is no significant superficial venous reflux to treat with laser ablation. Most recent ABI was 0.91 in the right foot with palpable dorsalis pedis pulse at 3+     Plan:        patient needs to return to the [place] wound center and be treated by Dr. Con Memos No arterial or venous options available  from vascular standpoint or treatment of right leg stasis ulcer Continued dressing changes and compression is the treatment of choice

## 2014-10-03 ENCOUNTER — Encounter: Payer: Medicare Other | Attending: Surgery | Admitting: Surgery

## 2014-10-03 DIAGNOSIS — E11610 Type 2 diabetes mellitus with diabetic neuropathic arthropathy: Secondary | ICD-10-CM | POA: Insufficient documentation

## 2014-10-03 DIAGNOSIS — L97911 Non-pressure chronic ulcer of unspecified part of right lower leg limited to breakdown of skin: Secondary | ICD-10-CM | POA: Insufficient documentation

## 2014-10-03 DIAGNOSIS — L03115 Cellulitis of right lower limb: Secondary | ICD-10-CM | POA: Insufficient documentation

## 2014-10-03 DIAGNOSIS — L97811 Non-pressure chronic ulcer of other part of right lower leg limited to breakdown of skin: Secondary | ICD-10-CM | POA: Diagnosis not present

## 2014-10-03 DIAGNOSIS — E11622 Type 2 diabetes mellitus with other skin ulcer: Secondary | ICD-10-CM | POA: Insufficient documentation

## 2014-10-03 DIAGNOSIS — I10 Essential (primary) hypertension: Secondary | ICD-10-CM | POA: Insufficient documentation

## 2014-10-03 DIAGNOSIS — I87331 Chronic venous hypertension (idiopathic) with ulcer and inflammation of right lower extremity: Secondary | ICD-10-CM | POA: Insufficient documentation

## 2014-10-03 NOTE — Progress Notes (Signed)
JAYDON, MAROTTE ([id number]) Visit Report for 10/03/2014 Arrival Information Details Patient Name: [name], [name] Date of Service: 10/03/2014 1:45 PM Medical Record Number: [id number] Patient Account Number: [id number] Date of Birth/Sex: October 18, 1927 (81 y.o. Female) Treating RN: [name] Primary Care Physician: Ria Bush Other Clinician: Referring Physician: Ria Bush Treating Physician/Extender: Frann Rider in Treatment: 7 Visit Information History Since Last Visit Added or deleted any medications: No Patient Arrived: [name] Any new allergies or adverse reactions: No Arrival Time: 13:36 Had a fall or experienced change in No Accompanied By: self activities of daily living that may affect Transfer Assistance: None risk of falls: Patient Identification Verified: Yes Signs or symptoms of abuse/neglect since last No Secondary Verification Process Yes visito Completed: Hospitalized since last visit: No Patient Has Alerts: Yes Has Dressing in Place as Prescribed: Yes Patient Alerts: Patient on Blood Has Compression in Place as Prescribed: Yes Thinner Pain Present Now: Yes Type II Diabetic 81 MG aspirin Electronic Signature(s) Signed: 10/03/2014 4:20:29 PM By: [name] Cool, RN, BSN, Kim RN, BSN Entered By: [name] Cool, RN, BSN, Kim on 10/03/2014 13:36:44 [name], [name] ([id number]) -------------------------------------------------------------------------------- Encounter Discharge Information Details Patient Name: [name], [name] Date of Service: 10/03/2014 1:45 PM Medical Record Number: [id number] Patient Account Number: [id number] Date of Birth/Sex: Nov 03, 1927 (81 y.o. Female) Treating RN: [name] Primary Care Physician: Ria Bush Other Clinician: Referring Physician: Ria Bush Treating Physician/Extender: Frann Rider in Treatment: 7 Encounter Discharge Information Items Discharge Pain Level: 0 Discharge Condition:  Stable Ambulatory Status: Walker Discharge Destination: Home Private Transportation: Auto Accompanied By: self Schedule Follow-up Appointment: Yes Medication Reconciliation completed and Yes provided to Patient/Care Zarius Furr: Clinical Summary of Care: Electronic Signature(s) Signed: 10/03/2014 4:20:29 PM By: [name] Cool RN, BSN, Kim RN, BSN Entered By: [name] Cool, RN, BSN, Kim on 10/03/2014 14:15:57 [name], [name] ([id number]) -------------------------------------------------------------------------------- Lower Extremity Assessment Details Patient Name: [name], [name] Date of Service: 10/03/2014 1:45 PM Medical Record Number: [id number] Patient Account Number: [id number] Date of Birth/Sex: 1927-04-18 (81 y.o. Female) Treating RN: [name] Primary Care Physician: Ria Bush Other Clinician: Referring Physician: Ria Bush Treating Physician/Extender: Frann Rider in Treatment: 7 Edema Assessment Assessed: [Left: No] [Right: No] E[Left: dema] [Right: :] Calf Left: Right: Point of Measurement: 27 cm From Medial Instep cm 25.9 cm Ankle Left: Right: Point of Measurement: 10 cm From Medial Instep cm 20.4 cm Vascular Assessment Pulses: Posterior Tibial Palpable: [Right:Yes] Dorsalis Pedis Palpable: [Right:Yes] Extremity colors, hair growth, and conditions: Extremity Color: [Right:Hyperpigmented] Hair Growth on Extremity: [Right:No] Temperature of Extremity: [Right:Warm] Capillary Refill: [Right:< 3 seconds] Toe Nail Assessment Left: Right: Thick: No Discolored: No Deformed: No Improper Length and Hygiene: No Electronic Signature(s) Signed: 10/03/2014 4:20:29 PM By: [name] Cool, RN, BSN, Kim RN, BSN Entered By: [name] Cool, RN, BSN, Kim on 10/03/2014 13:44:47 [name], [name] ([id number]) [name], [name] ([id number]) -------------------------------------------------------------------------------- Multi Wound Chart Details Patient Name: [name], [name] Date  of Service: 10/03/2014 1:45 PM Medical Record Number: [id number] Patient Account Number: [id number] Date of Birth/Sex: 06-23-1927 (81 y.o. Female) Treating RN: [name] Primary Care Physician: Ria Bush Other Clinician: Referring Physician: Ria Bush Treating Physician/Extender: Frann Rider in Treatment: 7 Vital Signs Height(in): 58 Pulse(bpm): 73 Weight(lbs): 151 Blood Pressure 125/59 (mmHg): Body Mass Index(BMI): 32 Temperature(F): 98.1 Respiratory Rate 18 (breaths/min): Photos: [1:No Photos] [N/A:N/A] Wound Location: [1:Right Lower Leg - Medial] [N/A:N/A] Wounding Event: [1:Gradually Appeared] [N/A:N/A] Primary Etiology: [1:Venous Leg Ulcer] [N/A:N/A] Comorbid History: [1:Cataracts, Anemia, Coronary Artery Disease, Hypertension, Myocardial Infarction, Type II Diabetes, Osteoarthritis] [  N/A:N/A] Date Acquired: [1:04/04/2014] [N/A:N/A] Weeks of Treatment: [1:7] [N/A:N/A] Wound Status: [1:Open] [N/A:N/A] Measurements L x W x D 3.5x3x0.2 [N/A:N/A] (cm) Area (cm) : [1:8.247] [N/A:N/A] Volume (cm) : [1:1.649] [N/A:N/A] % Reduction in Area: [1:-40.00%] [N/A:N/A] % Reduction in Volume: 6.70% [N/A:N/A] Classification: [1:Full Thickness Without Exposed Support Structures] [N/A:N/A] HBO Classification: [1:Grade 1] [N/A:N/A] Exudate Amount: [1:Small] [N/A:N/A] Exudate Type: [1:Serosanguineous] [N/A:N/A] Exudate Color: [1:red, brown] [N/A:N/A] Wound Margin: [1:Flat and Intact] [N/A:N/A] Granulation Amount: [1:Large (67-100%)] [N/A:N/A] Granulation Quality: [1:Red, Pink] [N/A:N/A] Necrotic Amount: [1:Small (1-33%)] [N/A:N/A] Exposed Structures: [N/A:N/A] Fascia: No Fat: No Tendon: No Muscle: No Joint: No Bone: No Limited to Skin Breakdown Epithelialization: Small (1-33%) N/A N/A Periwound Skin Texture: Edema: No N/A N/A Excoriation: No Induration: No Callus: No Crepitus: No Fluctuance: No Friable: No Rash: No Scarring: No Periwound Skin  Moist: Yes N/A N/A Moisture: Maceration: No Dry/Scaly: No Periwound Skin Color: Atrophie Blanche: No N/A N/A Cyanosis: No Ecchymosis: No Erythema: No Hemosiderin Staining: No Mottled: No Pallor: No Rubor: No Temperature: No Abnormality N/A N/A Tenderness on Yes N/A N/A Palpation: Wound Preparation: Ulcer Cleansing: Other: N/A N/A soap and water Topical Anesthetic Applied: Other: lidociane 4% Treatment Notes Electronic Signature(s) Signed: 10/03/2014 4:20:29 PM By: [name] Cool, RN, BSN, Kim RN, BSN Entered By: [name] Cool, RN, BSN, Kim on 10/03/2014 13:51:43 [name], [name] ([id number]) -------------------------------------------------------------------------------- Amber Plan Details Patient Name: [name], [name] Date of Service: 10/03/2014 1:45 PM Medical Record Number: [id number] Patient Account Number: [id number] Date of Birth/Sex: [birth year] (81 y.o. Female) Treating RN: [name] Primary Care Physician: Ria Bush Other Clinician: Referring Physician: Ria Bush Treating Physician/Extender: Frann Rider in Treatment: 7 Active Inactive Abuse / Safety / Falls / Self Care Management Nursing Diagnoses: Potential for falls Goals: Patient will remain injury free Date Initiated: 08/15/2014 Goal Status: Active Interventions: Assess fall risk on admission and as needed Notes: Nutrition Nursing Diagnoses: Impaired glucose control: actual or potential Goals: Patient/caregiver verbalizes understanding of need to maintain therapeutic glucose control per primary care physician Date Initiated: 08/15/2014 Goal Status: Active Interventions: Assess patient nutrition upon admission and as needed per policy Notes: Orientation to the Wound Care Program Nursing Diagnoses: Knowledge deficit related to the wound healing center program Goals: Patient/caregiver will verbalize understanding of the Rossie, Netha C.  ([id number]) Date Initiated: 08/15/2014 Goal Status: Active Interventions: Provide education on orientation to the wound center Notes: Venous Leg Ulcer Nursing Diagnoses: Actual venous Insuffiency (use after diagnosis is confirmed) Goals: Patient will maintain optimal edema control Date Initiated: 08/15/2014 Goal Status: Active Interventions: Compression as ordered Treatment Activities: Test ordered outside of clinic : 10/03/2014 Notes: Wound/Skin Impairment Nursing Diagnoses: Impaired tissue integrity Goals: Patient/caregiver will verbalize understanding of skin care regimen Date Initiated: 08/15/2014 Goal Status: Active Interventions: Assess patient/caregiver ability to obtain necessary supplies Notes: Electronic Signature(s) Signed: 10/03/2014 4:20:29 PM By: [name] Cool, RN, BSN, Kim RN, BSN Entered By: [name] Cool, RN, BSN, Kim on 10/03/2014 13:51:35 [name], [name] ([id number]) -------------------------------------------------------------------------------- Pain Assessment Details Patient Name: [name], [name] Date of Service: 10/03/2014 1:45 PM Medical Record Number: [id number] Patient Account Number: [id number] Date of Birth/Sex: [birth year] (81 y.o. Female) Treating RN: [name] Primary Care Physician: Ria Bush Other Clinician: Referring Physician: Ria Bush Treating Physician/Extender: Frann Rider in Treatment: 7 Active Problems Location of Pain Severity and Description of Pain Patient Has [name] Yes Site Locations Pain Location: Pain in Ulcers With Dressing Change: Yes Rate the pain. Current Pain Level: 2 Character of Pain Describe the Pain:  Throbbing Pain Management and Medication Current Pain Management: Electronic Signature(s) Signed: 10/03/2014 4:20:29 PM By: [name] Cool, RN, BSN, Kim RN, BSN Entered By: [name] Cool, RN, BSN, Kim on 10/03/2014 13:37:31 [name], [name]  ([id number]) -------------------------------------------------------------------------------- Patient/Caregiver Education Details Patient Name: [name], [name] Date of Service: 10/03/2014 1:45 PM Medical Record Number: [id number] Patient Account Number: [id number] Date of Birth/Gender: 1927-06-08 (81 y.o. Female) Treating RN: [name] Primary Care Physician: Ria Bush Other Clinician: Referring Physician: Ria Bush Treating Physician/Extender: Frann Rider in Treatment: 7 Education Assessment Education Provided To: Patient Education Topics Provided Wound/Skin Impairment: Handouts: Caring for Your Ulcer, Other: continue wound care as prescribed Methods: Demonstration Responses: State content correctly Electronic Signature(s) Signed: 10/03/2014 4:20:29 PM By: [name] Cool, RN, BSN, Kim RN, BSN Entered By: [name] Cool, RN, BSN, Kim on 10/03/2014 14:16:18 [name], [name] ([id number]) -------------------------------------------------------------------------------- Wound Assessment Details Patient Name: [name], [name] Date of Service: 10/03/2014 1:45 PM Medical Record Number: [id number] Patient Account Number: [id number] Date of Birth/Sex: 01-24-1928 (81 y.o. Female) Treating RN: [name] Primary Care Physician: Ria Bush Other Clinician: Referring Physician: Ria Bush Treating Physician/Extender: Frann Rider in Treatment: 7 Wound Status Wound Number: 1 Primary Venous Leg Ulcer Etiology: Wound Location: Right Lower Leg - Medial Wound Open Wounding Event: Gradually Appeared Status: Date Acquired: 04/04/2014 Comorbid Cataracts, Anemia, Coronary Artery Weeks Of Treatment: 7 History: Disease, Hypertension, Myocardial Clustered Wound: No Infarction, Type II Diabetes, Osteoarthritis Wound Measurements Length: (cm) 3.5 Width: (cm) 3 Depth: (cm) 0.2 Area: (cm) 8.247 Volume: (cm) 1.649 % Reduction in Area: -40% % Reduction in Volume:  6.7% Epithelialization: Small (1-33%) Tunneling: No Undermining: No Wound Description Full Thickness Without Exposed Foul Odor A Classification: Support Structures Diabetic Severity Grade 1 (Wagner): Wound Margin: Flat and Intact Exudate Amount: Small Exudate Type: Serosanguineous Exudate Color: red, brown fter Cleansing: No Wound Bed Granulation Amount: Large (67-100%) Exposed Structure Granulation Quality: Red, Pink Fascia Exposed: No Necrotic Amount: Small (1-33%) Fat Layer Exposed: No Necrotic Quality: Adherent Slough Tendon Exposed: No Muscle Exposed: No Joint Exposed: No Bone Exposed: No Limited to Skin Breakdown Periwound Skin Texture Texture Color Trine, Morine C. ([id number]) No Abnormalities Noted: No No Abnormalities Noted: No Callus: No Atrophie Blanche: No Crepitus: No Cyanosis: No Excoriation: No Ecchymosis: No Fluctuance: No Erythema: No Friable: No Hemosiderin Staining: No Induration: No Mottled: No Localized Edema: No Pallor: No Rash: No Rubor: No Scarring: No Temperature / Pain Moisture Temperature: No Abnormality No Abnormalities Noted: No Tenderness on Palpation: Yes Dry / Scaly: No Maceration: No Moist: Yes Wound Preparation Ulcer Cleansing: Other: soap and water, Topical Anesthetic Applied: Other: lidociane 4%, Treatment Notes Wound #1 (Right, Medial Lower Leg) 1. Cleansed with: Clean wound with Normal Saline 2. Anesthetic Topical Lidocaine 4% cream to wound bed prior to debridement 4. Dressing Applied: Aquacel Ag 5. Secondary Dressing Applied ABD Pad 7. Secured with 2 Layer Lite Compression System - Right Lower Extremity Electronic Signature(s) Signed: 10/03/2014 4:20:29 PM By: [name] Cool, RN, BSN, Kim RN, BSN Entered By: [name] Cool, RN, BSN, Kim on 10/03/2014 13:45:59 Brunetti, [name] ([id number]) -------------------------------------------------------------------------------- [place] Details Patient Name: Ciampi, Kamerin  C. Date of Service: 10/03/2014 1:45 PM Medical Record Number: [id number] Patient Account Number: [id number] Date of Birth/Sex: [birth year] (81 y.o. Female) Treating RN: [name] Primary Care Physician: Ria Bush Other Clinician: Referring Physician: Ria Bush Treating Physician/Extender: Frann Rider in Treatment: 7 Vital Signs Time Taken: 13:37 Temperature (F): 98.1 Height (in): 58 Pulse (bpm): 73 Weight (lbs): 151 Respiratory Rate (breaths/min): 18 Body Mass  Index (BMI): 31.6 Blood Pressure (mmHg): 125/59 Reference Range: 80 - 120 mg / dl Electronic Signature(s) Signed: 10/03/2014 4:20:29 PM By: [name] Cool, RN, BSN, Kim RN, BSN Entered By: [name] Cool, RN, BSN, Kim on 10/03/2014 13:38:15

## 2014-10-07 ENCOUNTER — Other Ambulatory Visit: Payer: Self-pay

## 2014-10-07 DIAGNOSIS — H04123 Dry eye syndrome of bilateral lacrimal glands: Secondary | ICD-10-CM | POA: Diagnosis not present

## 2014-10-07 MED ORDER — HYDROCODONE-ACETAMINOPHEN 5-325 MG PO TABS: 0.5000 | ORAL_TABLET | Freq: Four times a day (QID) | ORAL | Status: DC | PRN

## 2014-10-07 NOTE — Telephone Encounter (Signed)
printed and in Kim's box. 

## 2014-10-07 NOTE — Progress Notes (Signed)
SECORA, VILLEGAS ([id number]) Visit Report for 10/03/2014 HPI Details Patient Name: Ciotti, [name] Date of Service: 10/03/2014 1:45 PM Medical Record Number: [id number] Patient Account Number: [id number] Date of Birth/Sex: June 16, 1927 (81 y.o. Female) Treating RN: Primary Care Physician: Ria Bush Other Clinician: Referring Physician: Ria Bush Treating Physician/Extender: Frann Rider in Treatment: 7 History of Present Illness Location: ulcer in the right lower extremity for several months Quality: Patient reports experiencing a dull pain to affected area(s). Severity: Patient states wound (s) are getting better. Duration: Patient has had the wound for > 3 months prior to seeking treatment at the wound center Timing: Pain in wound is Intermittent (comes and goes Context: The wound appeared gradually over time Modifying Factors: Consults to this date include: vascular surgery for the veins in December 2015 and she's been seeing the wound care center at [facility] for several months. Associated Signs and Symptoms: Patient reports having difficulty standing for long periods. HPI Description: This is an 81 yrs old female we have been following since the beginning of January for a wound on the right medial ankle. She had previously had venous ablation of the greater saphenous vein by Dr. Kellie Simmering. She came to [place] with erythema of the right leg and subsequently is opened in the medial aspect of the right ankle. She has not been very compliant with compression. We ordered her at juxta lite stocking, however she apparently has not been able to wear it. The area on the medial aspect of her left ankle has much less induration and erythema. The edema is better controlled. No evidence of infection is noted. 07/18/14; no major change in the condition of this wound. There is still surface slough and probably some drainage. 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 All other veins showed no evidence of DVT, superficial venous thrombosis or incompetence. however she does have manifestations of chronic venous hypertension and I believe we will change her dressing over to Prisma and a Profore light dressing. she sees her primary care doctor regularly and her last hemoglobin A1c has been around 7%.her other comorbidities are hypertension, CABG in 1998, peptic ulcer disease, history of echo with a ejection fraction of 55-60% and grade 1 diastolic dysfunction. past surgical history include coronary artery bypass graft in 1998, cholecystectomy, appendectomy, hand surgery, endovenous ablation of the saphenous vein with laser by Dr. Kellie Simmering in December 2015. 08/29/2014 - she complains of increased calf pain over the past week. removed her compression bandage and pain improved. no fever or chills. Mild clear drainage. 09/04/2014 -- she has not been compliant and has not done a dressing change as we had ordered last week. She always complains and does not keep her wrap on and this past week she has done the same. She has completed a course of doxycycline. BELEN, WESTMEYER ([id number]) 09/25/2014 -- she has tolerated her dressing all week and has not removed it. She says it's hurting a lot today and would not like any debridement. We will do so as per her wishes. she did get a appointment with the vascular surgeon and sees him on 6/28. 10/03/2014 - She was seen by Dr. Tinnie Gens 09/30/2014. Assessment: I reviewed the previous venous duplex exam study performed in September 2015 which reveals no options for treating this venous ulcer. There is no significant superficial venous reflux to treat with  laser ablation.Most recent ABI was 0.91 in the right foot with palpable dorsalis pedis pulse at 3+ Plan: patient needs to return to the [place] wound center and be  treated by Dr. Con Memos. No arterial or venous options available from vascular standpoint or treatment of right leg stasis ulcer.Continued dressing changes and compression is the treatment of choice Electronic Signature(s) Signed: 10/07/2014 12:24:54 PM By: Christin Fudge MD, FACS Entered By: Christin Fudge on 10/03/2014 14:12:43 Hollen, [name] ([id number]) -------------------------------------------------------------------------------- Physical Exam Details Patient Name: [name], [name] Date of Service: 10/03/2014 1:45 PM Medical Record Number: [id number] Patient Account Number: [id number] Date of Birth/Sex: 03-28-1928 (81 y.o. Female) Treating RN: Primary Care Physician: Ria Bush Other Clinician: Referring Physician: Ria Bush Treating Physician/Extender: Frann Rider in Treatment: 7 Constitutional . Pulse regular. Respirations normal and unlabored. Afebrile. . Eyes Nonicteric. Reactive to light. Ears, Nose, Mouth, and Throat Lips, teeth, and gums WNL.Marland Kitchen Moist mucosa without lesions . Neck supple and nontender. No palpable supraclavicular or cervical adenopathy. Normal sized without goiter. Respiratory WNL. No retractions.. Cardiovascular Pedal Pulses WNL. No clubbing, cyanosis or edema. Musculoskeletal Adexa without tenderness or enlargement.. Digits and nails w/o clubbing, cyanosis, infection, petechiae, ischemia, or inflammatory conditions.. Integumentary (Hair, Skin) the ulcerated area in the right lower extremity looks cleaner and needs minimal debridement.. No crepitus or fluctuance. No peri-wound warmth or erythema. No masses.Marland Kitchen Psychiatric Judgement and insight Intact.. No evidence of depression, anxiety, or agitation.. Electronic Signature(s) Signed: 10/07/2014 12:24:54 PM By: Christin Fudge MD, FACS Entered By: Christin Fudge on 10/03/2014 14:13:15 Tollett, [name]  ([id number]) -------------------------------------------------------------------------------- Physician Orders Details Patient Name: [name], [name] Date of Service: 10/03/2014 1:45 PM Medical Record Number: [id number] Patient Account Number: [id number] Date of Birth/Sex: 1927/07/20 (81 y.o. Female) Treating RN: Cornell Barman Primary Care Physician: Ria Bush Other Clinician: Referring Physician: Ria Bush Treating Physician/Extender: Frann Rider in Treatment: 7 Verbal / Phone Orders: Yes Clinician: Cornell Barman Read Back and Verified: Yes Diagnosis Coding Wound Cleansing Wound #1 Right,Medial Lower Leg o Cleanse wound with mild soap and water Anesthetic Wound #1 Right,Medial Lower Leg o Topical Lidocaine 4% cream applied to wound bed prior to debridement Primary Wound Dressing Wound #1 Right,Medial Lower Leg o Aquacel Ag Secondary Dressing Wound #1 Right,Medial Lower Leg o ABD pad Dressing Change Frequency Wound #1 Right,Medial Lower Leg o Change dressing every week Follow-up Appointments Wound #1 Right,Medial Lower Leg o Return Appointment in 1 week. Edema Control Wound #1 Right,Medial Lower Leg o 2 Layer Lite Compression System - Right Lower Extremity Additional Orders / Instructions Wound #1 Right,Medial Lower Leg o Increase protein intake. Electronic Signature(s) ZAYDEE, GOUIN ([id number]) Signed: 10/03/2014 4:20:29 PM By: Gretta Cool RN, BSN, Kim RN, BSN Signed: 10/07/2014 12:24:54 PM By: Christin Fudge MD, FACS Entered By: Gretta Cool RN, BSN, Kim on 10/03/2014 14:14:37 Ribble, [name] ([id number]) -------------------------------------------------------------------------------- Progress Note Details Patient Name: Fouch, [name] Date of Service: 10/03/2014 1:45 PM Medical Record Number: [id number] Patient Account Number: [id number] Date of Birth/Sex: [birth year] (81 y.o. Female) Treating RN: Primary Care Physician: Ria Bush Other Clinician: Referring Physician: Ria Bush Treating Physician/Extender: Frann Rider in Treatment: 7 Subjective History of Present Illness (HPI) The following HPI elements were documented for the patient's wound: Location: ulcer in the right lower extremity for several months Quality: Patient reports experiencing a dull pain to affected area(s). Severity: Patient states wound (s) are getting better. Duration: Patient has had the wound for > 3 months prior to seeking treatment at the wound  center Timing: Pain in wound is Intermittent (comes and goes Context: The wound appeared gradually over time Modifying Factors: Consults to this date include: vascular surgery for the veins in December 2015 and she's been seeing the wound care center at [facility] for several months. Associated Signs and Symptoms: Patient reports having difficulty standing for long periods. This is an 81 yrs old female we have been following since the beginning of January for a wound on the right medial ankle. She had previously had venous ablation of the greater saphenous vein by Dr. Kellie Simmering. She came to [place] with erythema of the right leg and subsequently is opened in the medial aspect of the right ankle. She has not been very compliant with compression. We ordered her at juxta lite stocking, however she apparently has not been able to wear it. The area on the medial aspect of her left ankle has much less induration and erythema. The edema is better controlled. No evidence of infection is noted. 07/18/14; no major change in the condition of this wound. There is still surface slough and probably some drainage. 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 All other veins showed no evidence of DVT, superficial venous thrombosis or incompetence. however she does have manifestations  of chronic venous hypertension and I believe we will change her dressing over to Prisma and a Profore light dressing. she sees her primary care doctor regularly and her last hemoglobin A1c has been around 7%.her other comorbidities are hypertension, CABG in 1998, peptic ulcer disease, history of echo with a ejection fraction of 55-60% and grade 1 diastolic dysfunction. past surgical history include coronary artery bypass graft in 1998, cholecystectomy, appendectomy, hand surgery, endovenous ablation of the saphenous vein with laser by Dr. Kellie Simmering in December 2015. 08/29/2014 - she complains of increased calf pain over the past week. removed her compression bandage and pain improved. no fever or chills. Mild clear drainage. 09/04/2014 -- she has not been compliant and has not done a dressing change as we had ordered last week. She always complains and does not keep her wrap on and this past week she has done the same. She has completed a course of doxycycline. RIVERS, KERSCHER ([id number]) 09/25/2014 -- she has tolerated her dressing all week and has not removed it. She says it's hurting a lot today and would not like any debridement. We will do so as per her wishes. she did get a appointment with the vascular surgeon and sees him on 6/28. 10/03/2014 - She was seen by Dr. Tinnie Gens 09/30/2014. Assessment: I reviewed the previous venous duplex exam study performed in September 2015 which reveals no options for treating this venous ulcer. There is no significant superficial venous reflux to treat with laser ablation.Most recent ABI was 0.91 in the right foot with palpable dorsalis pedis pulse at 3+ Plan: patient needs to return to the [place] wound center and be treated by Dr. Con Memos. No arterial or venous options available from vascular standpoint or treatment of right leg stasis ulcer.Continued dressing changes and compression is the treatment of choice Objective Constitutional Pulse  regular. Respirations normal and unlabored. Afebrile. Vitals Time Taken: 1:37 PM, Height: 58 in, Weight: 151 lbs, BMI: 31.6, Temperature: 98.1 F, Pulse: 73 bpm, Respiratory Rate: 18 breaths/min, Blood Pressure: 125/59 mmHg. Eyes Nonicteric. Reactive to light. Ears, Nose, Mouth, and Throat Lips, teeth, and  gums WNL.Marland Kitchen Moist mucosa without lesions . Neck supple and nontender. No palpable supraclavicular or cervical adenopathy. Normal sized without goiter. Respiratory WNL. No retractions.. Cardiovascular Pedal Pulses WNL. No clubbing, cyanosis or edema. Musculoskeletal Adexa without tenderness or enlargement.. Digits and nails w/o clubbing, cyanosis, infection, petechiae, ischemia, or inflammatory conditions.Marland Kitchen Psychiatric Judgement and insight Intact.. No evidence of depression, anxiety, or agitation.Marland Kitchen Zechman, Chastidy C. ([id number]) Integumentary (Hair, Skin) the ulcerated area in the right lower extremity looks cleaner and needs minimal debridement.. No crepitus or fluctuance. No peri-wound warmth or erythema. No masses.. Wound #1 status is Open. Original cause of wound was Gradually Appeared. The wound is located on the Right,Medial Lower Leg. The wound measures 3.5cm length x 3cm width x 0.2cm depth; 8.247cm^2 area and 1.649cm^3 volume. The wound is limited to skin breakdown. There is no tunneling or undermining noted. There is a small amount of serosanguineous drainage noted. The wound margin is flat and intact. There is large (67-100%) red, pink granulation within the wound bed. There is a small (1-33%) amount of necrotic tissue within the wound bed including Adherent Slough. The periwound skin appearance exhibited: Moist. The periwound skin appearance did not exhibit: Callus, Crepitus, Excoriation, Fluctuance, Friable, Induration, Localized Edema, Rash, Scarring, Dry/Scaly, Maceration, Atrophie Blanche, Cyanosis, Ecchymosis, Hemosiderin Staining, Mottled, Pallor, Rubor, Erythema.  Periwound temperature was noted as No Abnormality. The periwound has tenderness on palpation. Assessment Her vascular surgeon has said that there is no further vascular intervention possible or needed and hence we are to continue with wound care as per my direction. We will continue local care as before and continue Silver alginate and a 2 layer light compression which she is able to tolerate for most of the week. We will also apply for some Apligraf and see if her insurance will cover it. She will come back and see me next week. Plan We will continue local care as before and continue Silver alginate and a 2 layer light compression which she is able to tolerate for most of the week. We will also apply for some Apligraf and see if her insurance will cover it. She will come back and see me next week Electronic Signature(s) Signed: 10/07/2014 12:24:54 PM By: Christin Fudge MD, FACS Entered By: Christin Fudge on 10/03/2014 14:15:33 Pence, [name] ([id number]) -------------------------------------------------------------------------------- SuperBill Details Patient Name: Brizzi, [name] Date of Service: 10/03/2014 Medical Record Number: [id number] Patient Account Number: [id number] Date of Birth/Sex: 09-05-1927 (81 y.o. Female) Treating RN: Cornell Barman Primary Care Physician: Ria Bush Other Clinician: Referring Physician: Ria Bush Treating Physician/Extender: Frann Rider in Treatment: 7 Diagnosis Coding ICD-10 Codes Code Description E11.622 Type 2 diabetes mellitus with other skin ulcer E11.610 Type 2 diabetes mellitus with diabetic neuropathic arthropathy Chronic venous hypertension (idiopathic) with ulcer and inflammation of right lower I87.331 extremity L03.115 Cellulitis of right lower limb Facility Procedures CPT4: Description Modifier Quantity Code [id number] (Facility Use Only) [phone] - [place] [id number] LWR RT 1 LEG Physician Procedures CPT4:  Description Modifier Quantity Code [id number] - WC PHYS LEVEL 3 - EST PT 1 ICD-10 Description Diagnosis E11.622 Type 2 diabetes mellitus with other skin ulcer E11.610 Type 2 diabetes mellitus with diabetic neuropathic arthropathy I87.331 Chronic  venous hypertension (idiopathic) with ulcer and inflammation of right lower extremity Electronic Signature(s) Signed: 10/07/2014 12:24:54 PM By: Christin Fudge MD, FACS Entered By: Christin Fudge on 10/03/2014 14:15:49

## 2014-10-07 NOTE — Telephone Encounter (Signed)
Pt left v/m requesting rx hydrocodone apap. Call when ready for pick up. Pt is out of med and having back pain due to arthritis and request to get rx 10/07/14. Pt request cb ASAP. Last printed # [id number] on 09/04/14 and last seen 09/02/14.

## 2014-10-08 NOTE — Telephone Encounter (Signed)
Patient notified and Rx placed up front for pick up. 

## 2014-10-09 ENCOUNTER — Encounter: Payer: Medicare Other | Admitting: Surgery

## 2014-10-09 DIAGNOSIS — L97911 Non-pressure chronic ulcer of unspecified part of right lower leg limited to breakdown of skin: Secondary | ICD-10-CM | POA: Diagnosis not present

## 2014-10-09 DIAGNOSIS — I87331 Chronic venous hypertension (idiopathic) with ulcer and inflammation of right lower extremity: Secondary | ICD-10-CM | POA: Diagnosis not present

## 2014-10-09 DIAGNOSIS — I10 Essential (primary) hypertension: Secondary | ICD-10-CM | POA: Diagnosis not present

## 2014-10-09 DIAGNOSIS — E11610 Type 2 diabetes mellitus with diabetic neuropathic arthropathy: Secondary | ICD-10-CM | POA: Diagnosis not present

## 2014-10-09 DIAGNOSIS — E11622 Type 2 diabetes mellitus with other skin ulcer: Secondary | ICD-10-CM | POA: Diagnosis not present

## 2014-10-09 DIAGNOSIS — L03115 Cellulitis of right lower limb: Secondary | ICD-10-CM | POA: Diagnosis not present

## 2014-10-10 NOTE — Progress Notes (Signed)
RENN, SITLER ([id number]) Visit Report for 10/09/2014 Chief Complaint Document Details Patient Name: [name], [name] Date of Service: 10/09/2014 3:00 PM Medical Record Number: [id number] Patient Account Number: [id number] Date of Birth/Sex: 04-07-1927 (81 y.o. Female) Treating RN: Primary Care Physician: Ria Bush Other Clinician: Referring Physician: Ria Bush Treating Physician/Extender: Frann Rider in Treatment: 7 Information Obtained from: Patient Chief Complaint Patient presents for treatment of an open ulcer due to venous insufficiency. 81 year old patient who is known to me from the [facility] wound care center now comes in follow up here for a chronic venous ulceration of her right lower extremity which she's had for about 6 months. Electronic Signature(s) Signed: 10/09/2014 3:46:18 PM By: Christin Fudge MD, FACS Entered By: Christin Fudge on 10/09/2014 15:46:18 [name], [name] ([id number]) -------------------------------------------------------------------------------- HPI Details Patient Name: [name], [name] Date of Service: 10/09/2014 3:00 PM Medical Record Number: [id number] Patient Account Number: [id number] Date of Birth/Sex: 01/15/1928 (81 y.o. Female) Treating RN: Primary Care Physician: Ria Bush Other Clinician: Referring Physician: Ria Bush Treating Physician/Extender: Frann Rider in Treatment: 7 History of Present Illness Location: ulcer in the right lower extremity for several months Quality: Patient reports experiencing a dull pain to affected area(s). Severity: Patient states wound (s) are getting better. Duration: Patient has had the wound for > 3 months prior to seeking treatment at the wound center Timing: Pain in wound is Intermittent (comes and goes Context: The wound appeared gradually over time Modifying Factors: Consults to this date include: vascular surgery for the veins in December 2015  and she's been seeing the wound care center at [facility] for several months. Associated Signs and Symptoms: Patient reports having difficulty standing for long periods. HPI Description: This is an 81 yrs old female we have been following since the beginning of January for a wound on the right medial ankle. She had previously had venous ablation of the greater saphenous vein by Dr. Kellie Simmering. She came to [place] with erythema of the right leg and subsequently is opened in the medial aspect of the right ankle. She has not been very compliant with compression. We ordered her at juxta lite stocking, however she apparently has not been able to wear it. The area on the medial aspect of her left ankle has much less induration and erythema. The edema is better controlled. No evidence of infection is noted. 07/18/14; no major change in the condition of this wound. There is still surface slough and probably some drainage. 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 All other veins showed no evidence of DVT, superficial venous thrombosis or incompetence. however she does have manifestations of chronic venous hypertension and I believe we will change her dressing over to Prisma and a Profore light dressing. she sees her primary care doctor regularly and her last hemoglobin A1c has been around 7%.her other comorbidities are hypertension, CABG in 1998, peptic ulcer disease, history of echo with a ejection fraction of 55-60% and grade 1 diastolic dysfunction. past surgical history include coronary artery bypass graft in 1998, cholecystectomy, appendectomy, hand surgery, endovenous ablation of the saphenous vein with laser by Dr. Kellie Simmering in December 2015. 08/29/2014 - she complains of increased calf pain over the past week. removed her compression bandage and pain improved. no fever or chills. Mild  clear drainage. 09/04/2014 -- she has not been compliant and has not done a  dressing change as we had ordered last week. She always complains and does not keep her wrap on and this past week she has done the same. She has completed a course of doxycycline. 09/25/2014 -- she has tolerated her dressing all week and has not removed it. She says it's hurting a lot today and would not like any debridement. We will do so as per her wishes. she did get a appointment with the vascular surgeon and sees him on 6/28. MATIANA, MARTINCIC ([id number]) 10/03/2014 - She was seen by Dr. Tinnie Gens 09/30/2014. Assessment: I reviewed the previous venous duplex exam study performed in September 2015 which reveals no options for treating this venous ulcer. There is no significant superficial venous reflux to treat with laser ablation.Most recent ABI was 0.91 in the right foot with palpable dorsalis pedis pulse at 3+ Plan: patient needs to return to the [place] [facility] wound center and be treated by Dr. Con Memos. No arterial or venous options available from vascular standpoint or treatment of right leg stasis ulcer.Continued dressing changes and compression is the treatment of choice Electronic Signature(s) Signed: 10/09/2014 3:46:25 PM By: Christin Fudge MD, FACS Entered By: Christin Fudge on 10/09/2014 15:46:24 [name], [name] ([id number]) -------------------------------------------------------------------------------- Physical Exam Details Patient Name: [name], [name] Date of Service: 10/09/2014 3:00 PM Medical Record Number: [id number] Patient Account Number: [id number] Date of Birth/Sex: 12/11/1927 (81 y.o. Female) Treating RN: Primary Care Physician: Ria Bush Other Clinician: Referring Physician: Ria Bush Treating Physician/Extender: Frann Rider in Treatment: 7 Constitutional . Pulse regular. Respirations normal and unlabored. Afebrile. . Eyes Nonicteric. Reactive to light. Ears,  Nose, Mouth, and Throat Lips, teeth, and gums WNL.Marland Kitchen Moist mucosa without lesions . Neck supple and nontender. No palpable supraclavicular or cervical adenopathy. Normal sized without goiter. Respiratory WNL. No retractions.. Cardiovascular Pedal Pulses WNL. No clubbing, cyanosis or edema. Gastrointestinal (GI) Abdomen without masses or tenderness.. No liver or spleen enlargement or tenderness.. Lymphatic No adneopathy. No adenopathy. No adenopathy. Musculoskeletal Adexa without tenderness or enlargement.. Digits and nails w/o clubbing, cyanosis, infection, petechiae, ischemia, or inflammatory conditions.. Integumentary (Hair, Skin) No suspicious lesions. No crepitus or fluctuance. No peri-wound warmth or erythema. No masses.Marland Kitchen Psychiatric Judgement and insight Intact.. No evidence of depression, anxiety, or agitation.. Notes the wound in the right lower extremity looks very clean and I believe it is ready for an Apligraf. We'll try and get this for her next week. Electronic Signature(s) Signed: 10/09/2014 3:47:12 PM By: Christin Fudge MD, FACS Entered By: Christin Fudge on 10/09/2014 15:47:12 [name], [name] ([id number]) -------------------------------------------------------------------------------- Physician Orders Details Patient Name: [name], [name] Date of Service: 10/09/2014 3:00 PM Medical Record Number: [id number] Patient Account Number: [id number] Date of Birth/Sex: Nov 03, 1927 (81 y.o. Female) Treating RN: Afful, RN, BSN, Velva Harman Primary Care Physician: Ria Bush Other Clinician: Referring Physician: Ria Bush Treating Physician/Extender: Frann Rider in Treatment: 7 Verbal / Phone Orders: Yes Clinician: Afful, RN, BSN, Rita Read Back and Verified: Yes Diagnosis Coding Wound Cleansing Wound #1 Right,Medial Lower Leg o Cleanse wound with mild soap and water Anesthetic Wound #1 Right,Medial Lower Leg o Topical Lidocaine 4% cream applied to  wound bed prior to debridement Primary Wound Dressing Wound #1 Right,Medial Lower Leg o Aquacel Ag Secondary Dressing Wound #1 Right,Medial Lower Leg o ABD pad Dressing Change Frequency Wound #1 Right,Medial Lower Leg o Change dressing every week Follow-up Appointments Wound #1 Right,Medial Lower Leg o Return Appointment in 1 week. Edema Control Wound #1 Right,Medial Lower Leg o 2 Layer  Lite Compression System - Right Lower Extremity Additional Orders / Instructions Wound #1 Right,Medial Lower Leg o Increase protein intake. Notes [name], [name] order apligraf Electronic Signature(s) Signed: 10/09/2014 4:18:25 PM By: Regan Lemming BSN, RN Signed: 10/09/2014 4:41:07 PM By: Christin Fudge MD, FACS Entered By: Regan Lemming on 10/09/2014 15:17:21 [name], [name] -------------------------------------------------------------------------------- Problem List Details Patient Name: [name], [name] Date of Service: 10/09/2014 3:00 PM Medical Record Number: [id number] Patient Account Number: [id number] Date of Birth/Sex: 02-23-1928 (81 y.o. Female) Treating RN: Primary Care Physician: Ria Bush Other Clinician: Referring Physician: Ria Bush Treating Physician/Extender: Frann Rider in Treatment: 7 Active Problems ICD-10 Encounter Code Description Active Date Diagnosis E11.622 Type 2 diabetes mellitus with other skin ulcer 08/15/2014 Yes E11.610 Type 2 diabetes mellitus with diabetic neuropathic 08/15/2014 Yes arthropathy I87.331 Chronic venous hypertension (idiopathic) with ulcer and 08/15/2014 Yes inflammation of right lower extremity L03.115 Cellulitis of right lower limb 08/29/2014 Yes Inactive Problems Resolved Problems Electronic Signature(s) Signed: 10/09/2014 3:46:04 PM By: Christin Fudge MD, FACS Entered By: Christin Fudge on 10/09/2014 15:46:04 Kleiman, [name]  ([id number]) -------------------------------------------------------------------------------- Progress Note Details Patient Name: [name], [name] Date of Service: 10/09/2014 3:00 PM Medical Record Number: [id number] Patient Account Number: [id number] Date of Birth/Sex: July 09, 1927 (81 y.o. Female) Treating RN: Primary Care Physician: Ria Bush Other Clinician: Referring Physician: Ria Bush Treating Physician/Extender: Frann Rider in Treatment: 7 Subjective Chief Complaint Information obtained from Patient Patient presents for treatment of an open ulcer due to venous insufficiency. 81 year old patient who is known to me from the [facility] wound care center now comes in follow up here for a chronic venous ulceration of her right lower extremity which she's had for about 6 months. History of Present Illness (HPI) The following HPI elements were documented for the patient's wound: Location: ulcer in the right lower extremity for several months Quality: Patient reports experiencing a dull pain to affected area(s). Severity: Patient states wound (s) are getting better. Duration: Patient has had the wound for > 3 months prior to seeking treatment at the wound center Timing: Pain in wound is Intermittent (comes and goes Context: The wound appeared gradually over time Modifying Factors: Consults to this date include: vascular surgery for the veins in December 2015 and she's been seeing the wound care center at [facility] for several months. Associated Signs and Symptoms: Patient reports having difficulty standing for long periods. This is an 81 yrs old female we have been following since the beginning of January for a wound on the right medial ankle. She had previously had venous ablation of the greater saphenous vein by Dr. Kellie Simmering. She came to [place] with erythema of the right leg and subsequently is opened in the medial aspect of the right ankle. She has not been very  compliant with compression. We ordered her at juxta lite stocking, however she apparently has not been able to wear it. The area on the medial aspect of her left ankle has much less induration and erythema. The edema is better controlled. No evidence of infection is noted. 07/18/14; no major change in the condition of this wound. There is still surface slough and probably some drainage. 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 All other veins showed no evidence of DVT, superficial venous thrombosis or incompetence. however she does have manifestations of chronic venous hypertension and I believe  we will change her dressing over to Prisma and a Profore light dressing. she sees her primary care doctor regularly and her last hemoglobin A1c has been around 7%.her other comorbidities are hypertension, CABG in 1998, peptic ulcer disease, history of echo with a ejection fraction of 55-60% and grade 1 diastolic dysfunction. past surgical history include coronary artery bypass graft in 1998, cholecystectomy, appendectomy, hand surgery, endovenous ablation of the saphenous vein with laser by Dr. Kellie Simmering in December 2015. [name], [name] ([id number]) 08/29/2014 - she complains of increased calf pain over the past week. removed her compression bandage and pain improved. no fever or chills. Mild clear drainage. 09/04/2014 -- she has not been compliant and has not done a dressing change as we had ordered last week. She always complains and does not keep her wrap on and this past week she has done the same. She has completed a course of doxycycline. 09/25/2014 -- she has tolerated her dressing all week and has not removed it. She says it's hurting a lot today and would not like any debridement. We will do so as per her wishes. she did get a appointment with the vascular surgeon and sees  him on 6/28. 10/03/2014 - She was seen by Dr. Tinnie Gens 09/30/2014. Assessment: I reviewed the previous venous duplex exam study performed in September 2015 which reveals no options for treating this venous ulcer. There is no significant superficial venous reflux to treat with laser ablation.Most recent ABI was 0.91 in the right foot with palpable dorsalis pedis pulse at 3+ Plan: patient needs to return to the [place] [facility] wound center and be treated by Dr. Con Memos. No arterial or venous options available from vascular standpoint or treatment of right leg stasis ulcer.Continued dressing changes and compression is the treatment of choice Objective Constitutional Pulse regular. Respirations normal and unlabored. Afebrile. Vitals Time Taken: 3:02 PM, Height: 58 in, Weight: 151 lbs, BMI: 31.6, Temperature: 97.7 F, Pulse: 73 bpm, Respiratory Rate: 17 breaths/min, Blood Pressure: 120/47 mmHg. Eyes Nonicteric. Reactive to light. Ears, Nose, Mouth, and Throat Lips, teeth, and gums WNL.Marland Kitchen Moist mucosa without lesions . Neck supple and nontender. No palpable supraclavicular or cervical adenopathy. Normal sized without goiter. Respiratory WNL. No retractions.. Cardiovascular Pedal Pulses WNL. No clubbing, cyanosis or edema. Gastrointestinal (GI) [name], Vonnie C. ([id number]) Abdomen without masses or tenderness.. No liver or spleen enlargement or tenderness.. Lymphatic No adneopathy. No adenopathy. No adenopathy. Musculoskeletal Adexa without tenderness or enlargement.. Digits and nails w/o clubbing, cyanosis, infection, petechiae, ischemia, or inflammatory conditions.Marland Kitchen Psychiatric Judgement and insight Intact.. No evidence of depression, anxiety, or agitation.. General Notes: the wound in the right lower extremity looks very clean and I believe it is ready for an Apligraf. We'll try and get this for her next week. Integumentary (Hair, Skin) No suspicious lesions. No crepitus or fluctuance.  No peri-wound warmth or erythema. No masses.. Wound #1 status is Open. Original cause of wound was Gradually Appeared. The wound is located on the Right,Medial Lower Leg. The wound measures 5.5cm length x 4cm width x 0.2cm depth; 17.279cm^2 area and 3.456cm^3 volume. The wound is limited to skin breakdown. There is no tunneling or undermining noted. There is a large amount of serosanguineous drainage noted. The wound margin is flat and intact. There is large (67-100%) red, pink granulation within the wound bed. There is a small (1-33%) amount of necrotic tissue within the wound bed including Adherent Slough. The periwound skin appearance exhibited: Maceration, Moist. The periwound skin appearance did not  exhibit: Callus, Crepitus, Excoriation, Fluctuance, Friable, Induration, Localized Edema, Rash, Scarring, Dry/Scaly, Atrophie Blanche, Cyanosis, Ecchymosis, Hemosiderin Staining, Mottled, Pallor, Rubor, Erythema. Periwound temperature was noted as No Abnormality. The periwound has tenderness on palpation. Assessment Active Problems ICD-10 E11.622 - Type 2 diabetes mellitus with other skin ulcer E11.610 - Type 2 diabetes mellitus with diabetic neuropathic arthropathy I87.331 - Chronic venous hypertension (idiopathic) with ulcer and inflammation of right lower extremity L03.115 - Cellulitis of right lower limb We will continue with the application of Aquacel Ag and a light layer compression wrap. Grigg, Fatima C. ([id number]) We will get her Apligraf for next week and continue with local care until then. Plan Wound Cleansing: Wound #1 Right,Medial Lower Leg: Cleanse wound with mild soap and water Anesthetic: Wound #1 Right,Medial Lower Leg: Topical Lidocaine 4% cream applied to wound bed prior to debridement Primary Wound Dressing: Wound #1 Right,Medial Lower Leg: Aquacel Ag Secondary Dressing: Wound #1 Right,Medial Lower Leg: ABD pad Dressing Change Frequency: Wound #1 Right,Medial  Lower Leg: Change dressing every week Follow-up Appointments: Wound #1 Right,Medial Lower Leg: Return Appointment in 1 week. Edema Control: Wound #1 Right,Medial Lower Leg: 2 Layer Lite Compression System - Right Lower Extremity Additional Orders / Instructions: Wound #1 Right,Medial Lower Leg: Increase protein intake. General Notes: order apligraf We will continue with the application of Aquacel Ag and a light layer compression wrap. We will get her Apligraf for next week and continue with local care until then. Electronic Signature(s) Signed: 10/09/2014 3:49:40 PM By: Christin Fudge MD, FACS Previous Signature: 10/09/2014 3:49:27 PM Version By: Christin Fudge MD, FACS Entered By: Christin Fudge on 10/09/2014 15:49:39 Pidgeon, [name] ([id number]) Mumaw, [name] ([id number]) -------------------------------------------------------------------------------- SuperBill Details Patient Name: Althoff, Saveah C. Date of Service: 10/09/2014 Medical Record Number: [id number] Patient Account Number: [id number] Date of Birth/Sex: 14-Apr-1927 (81 y.o. Female) Treating RN: Primary Care Physician: Ria Bush Other Clinician: Referring Physician: Ria Bush Treating Physician/Extender: Frann Rider in Treatment: 7 Diagnosis Coding ICD-10 Codes Code Description E11.622 Type 2 diabetes mellitus with other skin ulcer E11.610 Type 2 diabetes mellitus with diabetic neuropathic arthropathy Chronic venous hypertension (idiopathic) with ulcer and inflammation of right lower I87.331 extremity L03.115 Cellulitis of right lower limb Facility Procedures CPT4 Code: [id number] Description: 99213 - WOUND CARE VISIT-LEV 3 EST PT Modifier: Quantity: 1 Physician Procedures CPT4: Description Modifier Quantity Code [id number] - WC PHYS LEVEL 3 - EST PT 1 ICD-10 Description Diagnosis E11.622 Type 2 diabetes mellitus with other skin ulcer E11.610 Type 2 diabetes mellitus with diabetic  neuropathic arthropathy I87.331 Chronic  venous hypertension (idiopathic) with ulcer and inflammation of right lower extremity Electronic Signature(s) Signed: 10/09/2014 3:50:00 PM By: Christin Fudge MD, FACS Entered By: Christin Fudge on 10/09/2014 15:50:00

## 2014-10-10 NOTE — Progress Notes (Signed)
ATINA, BARTOLOMEO ([id number]) Visit Report for 10/09/2014 Arrival Information Details Patient Name: [name], [name] Date of Service: 10/09/2014 3:00 PM Medical Record Number: [id number] Patient Account Number: [id number] Date of Birth/Sex: 23-Oct-1927 (81 y.o. Female) Treating RN: Afful, RN, BSN, Velva Harman Primary Care Physician: Ria Bush Other Clinician: Referring Physician: Ria Bush Treating Physician/Extender: Frann Rider in Treatment: 7 Visit Information History Since Last Visit Any new allergies or adverse reactions: No Patient Arrived: [name] Had a fall or experienced change in No Arrival Time: 15:01 activities of daily living that may affect Accompanied By: son in lobby risk of falls: Transfer Assistance: None Signs or symptoms of abuse/neglect since last No Patient Identification Verified: Yes visito Secondary Verification Process Yes Hospitalized since last visit: No Completed: Has Dressing in Place as Prescribed: Yes Patient Has Alerts: Yes Has Compression in Place as Prescribed: Yes Patient Alerts: Patient on Blood Pain Present Now: No Thinner Type II Diabetic 81 MG aspirin Electronic Signature(s) Signed: 10/09/2014 4:18:25 PM By: Regan Lemming BSN, RN Entered By: Regan Lemming on 10/09/2014 15:01:45 Doggett, [name] ([id number]) -------------------------------------------------------------------------------- Clinic Level of Care Assessment Details Patient Name: [name], [name] Date of Service: 10/09/2014 3:00 PM Medical Record Number: [id number] Patient Account Number: [id number] Date of Birth/Sex: 1927-09-13 (81 y.o. Female) Treating RN: Afful, RN, BSN, Velva Harman Primary Care Physician: Ria Bush Other Clinician: Referring Physician: Ria Bush Treating Physician/Extender: Frann Rider in Treatment: 7 Clinic Level of Care Assessment Items TOOL 4 Quantity Score []  - Use when only an EandM is performed on FOLLOW-UP visit  0 ASSESSMENTS - Nursing Assessment / Reassessment X - Reassessment of Co-morbidities (includes updates in patient status) 1 10 X - Reassessment of Adherence to Treatment Plan 1 5 ASSESSMENTS - Wound and Skin Assessment / Reassessment X - Simple Wound Assessment / Reassessment - one wound 1 5 []  - Complex Wound Assessment / Reassessment - multiple wounds 0 []  - Dermatologic / Skin Assessment (not related to wound area) 0 ASSESSMENTS - Focused Assessment []  - Circumferential Edema Measurements - multi extremities 0 []  - Nutritional Assessment / Counseling / Intervention 0 X - Lower Extremity Assessment (monofilament, tuning fork, pulses) 1 5 []  - Peripheral Arterial Disease Assessment (using hand held doppler) 0 ASSESSMENTS - Ostomy and/or Continence Assessment and Care []  - Incontinence Assessment and Management 0 []  - Ostomy Care Assessment and Management (repouching, etc.) 0 PROCESS - Coordination of Care X - Simple Patient / Family Education for ongoing care 1 15 []  - Complex (extensive) Patient / Family Education for ongoing care 0 []  - Staff obtains Programmer, systems, Records, Test Results / Process Orders 0 []  - Staff telephones HHA, Nursing Homes / Clarify orders / etc 0 []  - Routine Transfer to another Facility (non-emergent condition) 0 [name], [name] ([id number]) []  - Routine Hospital Admission (non-emergent condition) 0 []  - New Admissions / Biomedical engineer / Ordering NPWT, Apligraf, etc. 0 []  - Emergency Hospital Admission (emergent condition) 0 X - Simple Discharge Coordination 1 10 []  - Complex (extensive) Discharge Coordination 0 PROCESS - Special Needs []  - Pediatric / Minor Patient Management 0 []  - Isolation Patient Management 0 []  - Hearing / Language / Visual special needs 0 []  - Assessment of Community assistance (transportation, D/[name] planning, etc.) 0 []  - Additional assistance / Altered mentation 0 []  - Support Surface(s) Assessment (bed, cushion, seat, etc.)  0 INTERVENTIONS - Wound Cleansing / Measurement X - Simple Wound Cleansing - one wound 1 5 []  - Complex Wound Cleansing - multiple  wounds 0 X - Wound Imaging (photographs - any number of wounds) 1 5 []  - Wound Tracing (instead of photographs) 0 X - Simple Wound Measurement - one wound 1 5 []  - Complex Wound Measurement - multiple wounds 0 INTERVENTIONS - Wound Dressings X - Small Wound Dressing one or multiple wounds 1 10 []  - Medium Wound Dressing one or multiple wounds 0 []  - Large Wound Dressing one or multiple wounds 0 []  - Application of Medications - topical 0 []  - Application of Medications - injection 0 INTERVENTIONS - Miscellaneous []  - External ear exam 0 [name], [name] ([id number]) []  - Specimen Collection (cultures, biopsies, blood, body fluids, etc.) 0 []  - Specimen(s) / Culture(s) sent or taken to Lab for analysis 0 []  - Patient Transfer (multiple staff / Harrel Lemon Lift / Similar devices) 0 []  - Simple Staple / Suture removal (25 or less) 0 []  - Complex Staple / Suture removal (26 or more) 0 []  - Hypo / Hyperglycemic Management (close monitor of Blood Glucose) 0 []  - Ankle / Brachial Index (ABI) - do not check if billed separately 0 X - Vital Signs 1 5 Has the patient been seen at the hospital within the last three years: Yes Total Score: 80 Level Of Care: New/Established - Level 3 Electronic Signature(s) Signed: 10/09/2014 4:18:25 PM By: Regan Lemming BSN, RN Entered By: Regan Lemming on 10/09/2014 15:17:59 Guettler, [name] ([id number]) -------------------------------------------------------------------------------- Encounter Discharge Information Details Patient Name: [name], [name] Date of Service: 10/09/2014 3:00 PM Medical Record Number: [id number] Patient Account Number: [id number] Date of Birth/Sex: [birth year] (81 y.o. Female) Treating RN: Baruch Gouty, RN, BSN, Velva Harman Primary Care Physician: Ria Bush Other Clinician: Referring Physician: Ria Bush Treating Physician/Extender: Frann Rider in Treatment: 7 Encounter Discharge Information Items Discharge Pain Level: 0 Discharge Condition: Stable Ambulatory Status: Walker Discharge Destination: Home Private Transportation: Auto Accompanied By: son Schedule Follow-up Appointment: No Medication Reconciliation completed and No provided to Patient/Care Noora Locascio: Clinical Summary of Care: Electronic Signature(s) Signed: 10/09/2014 4:18:25 PM By: Regan Lemming BSN, RN Entered By: Regan Lemming on 10/09/2014 15:29:39 Driggs, [name] ([id number]) -------------------------------------------------------------------------------- Lower Extremity Assessment Details Patient Name: [name], [name] Date of Service: 10/09/2014 3:00 PM Medical Record Number: [id number] Patient Account Number: [id number] Date of Birth/Sex: 1927/09/15 (80 y.o. Female) Treating RN: Afful, RN, BSN, Velva Harman Primary Care Physician: Ria Bush Other Clinician: Referring Physician: Ria Bush Treating Physician/Extender: Frann Rider in Treatment: 7 Edema Assessment Assessed: Shirlyn Goltz: No] [Right: No] Edema: [Left: Ye] [Right: s] Calf Left: Right: Point of Measurement: 27 cm From Medial Instep cm 26 cm Ankle Left: Right: Point of Measurement: 10 cm From Medial Instep cm 20.3 cm Vascular Assessment Claudication: Claudication Assessment [Right:None] Pulses: Posterior Tibial Dorsalis Pedis Palpable: [Right:Yes] Extremity colors, hair growth, and conditions: Extremity Color: [Right:Mottled] Hair Growth on Extremity: [Right:No] Temperature of Extremity: [Right:Warm] Capillary Refill: [Right:< 3 seconds] Dependent Rubor: [Right:No] Blanched when Elevated: [Right:No] Lipodermatosclerosis: [Right:No] Toe Nail Assessment Left: Right: Thick: Yes Discolored: Yes Deformed: No Improper Length and Hygiene: No ELLIYANNA, TORIZ ([id number]) Electronic Signature(s) Signed: 10/09/2014  4:18:25 PM By: Regan Lemming BSN, RN Entered By: Regan Lemming on 10/09/2014 15:05:29 Turrell, Gem Loletha Grayer ([id number]) -------------------------------------------------------------------------------- Multi Wound Chart Details Patient Name: [name], [name] Date of Service: 10/09/2014 3:00 PM Medical Record Number: [id number] Patient Account Number: [id number] Date of Birth/Sex: 26-Nov-1927 (81 y.o. Female) Treating RN: Afful, RN, BSN, Velva Harman Primary Care Physician: Ria Bush Other Clinician: Referring Physician: Ria Bush Treating Physician/Extender: Frann Rider in  Treatment: 7 Vital Signs Height(in): 58 Pulse(bpm): 73 Weight(lbs): 151 Blood Pressure 120/47 (mmHg): Body Mass Index(BMI): 32 Temperature(F): 97.7 Respiratory Rate 17 (breaths/min): Photos: [1:No Photos] [N/A:N/A] Wound Location: [1:Right Lower Leg - Medial] [N/A:N/A] Wounding Event: [1:Gradually Appeared] [N/A:N/A] Primary Etiology: [1:Venous Leg Ulcer] [N/A:N/A] Comorbid History: [1:Cataracts, Anemia, Coronary Artery Disease, Hypertension, Myocardial Infarction, Type II Diabetes, Osteoarthritis] [N/A:N/A] Date Acquired: [1:04/04/2014] [N/A:N/A] Weeks of Treatment: [1:7] [N/A:N/A] Wound Status: [1:Open] [N/A:N/A] Measurements L x W x D 5.5x4x0.2 [N/A:N/A] (cm) Area (cm) : [1:17.279] [N/A:N/A] Volume (cm) : [1:3.456] [N/A:N/A] % Reduction in Area: [1:-193.40%] [N/A:N/A] % Reduction in Volume: -95.60% [N/A:N/A] Classification: [1:Full Thickness Without Exposed Support Structures] [N/A:N/A] HBO Classification: [1:Grade 1] [N/A:N/A] Exudate Amount: [1:Large] [N/A:N/A] Exudate Type: [1:Serosanguineous] [N/A:N/A] Exudate Color: [1:red, brown] [N/A:N/A] Wound Margin: [1:Flat and Intact] [N/A:N/A] Granulation Amount: [1:Large (67-100%)] [N/A:N/A] Granulation Quality: [1:Red, Pink] [N/A:N/A] Necrotic Amount: [1:Small (1-33%)] [N/A:N/A] Exposed Structures: [N/A:N/A] Fascia: No Fat: No Tendon:  No Muscle: No Joint: No Bone: No Limited to Skin Breakdown Epithelialization: Small (1-33%) N/A N/A Periwound Skin Texture: Edema: No N/A N/A Excoriation: No Induration: No Callus: No Crepitus: No Fluctuance: No Friable: No Rash: No Scarring: No Periwound Skin Maceration: Yes N/A N/A Moisture: Moist: Yes Dry/Scaly: No Periwound Skin Color: Atrophie Blanche: No N/A N/A Cyanosis: No Ecchymosis: No Erythema: No Hemosiderin Staining: No Mottled: No Pallor: No Rubor: No Temperature: No Abnormality N/A N/A Tenderness on Yes N/A N/A Palpation: Wound Preparation: Ulcer Cleansing: Other: N/A N/A soap and water Topical Anesthetic Applied: Other: lidociane 4% Treatment Notes Electronic Signature(s) Signed: 10/09/2014 4:18:25 PM By: Regan Lemming BSN, RN Entered By: Regan Lemming on 10/09/2014 15:15:53 Duford, [name] ([id number]) -------------------------------------------------------------------------------- Multi-Disciplinary Care Plan Details Patient Name: [name], [name] Date of Service: 10/09/2014 3:00 PM Medical Record Number: [id number] Patient Account Number: [id number] Date of Birth/Sex: 12-Jul-1927 (81 y.o. Female) Treating RN: Afful, RN, BSN, Velva Harman Primary Care Physician: Ria Bush Other Clinician: Referring Physician: Ria Bush Treating Physician/Extender: Frann Rider in Treatment: 7 Active Inactive Abuse / Safety / Falls / Self Care Management Nursing Diagnoses: Potential for falls Goals: Patient will remain injury free Date Initiated: 08/15/2014 Goal Status: Active Interventions: Assess fall risk on admission and as needed Notes: Nutrition Nursing Diagnoses: Impaired glucose control: actual or potential Goals: Patient/caregiver verbalizes understanding of need to maintain therapeutic glucose control per primary care physician Date Initiated: 08/15/2014 Goal Status: Active Interventions: Assess patient nutrition upon admission  and as needed per policy Notes: Orientation to the Wound Care Program Nursing Diagnoses: Knowledge deficit related to the wound healing center program Goals: Patient/caregiver will verbalize understanding of the [place], Yanis [name]. ([id number]) Date Initiated: 08/15/2014 Goal Status: Active Interventions: Provide education on orientation to the wound center Notes: Venous Leg Ulcer Nursing Diagnoses: Actual venous Insuffiency (use after diagnosis is confirmed) Goals: Patient will maintain optimal edema control Date Initiated: 08/15/2014 Goal Status: Active Interventions: Compression as ordered Treatment Activities: Test ordered outside of clinic : 10/09/2014 Notes: Wound/Skin Impairment Nursing Diagnoses: Impaired tissue integrity Goals: Patient/caregiver will verbalize understanding of skin care regimen Date Initiated: 08/15/2014 Goal Status: Active Interventions: Assess patient/caregiver ability to obtain necessary supplies Notes: Electronic Signature(s) Signed: 10/09/2014 4:18:25 PM By: Regan Lemming BSN, RN Entered By: Regan Lemming on 10/09/2014 15:15:42 [name], [name] ([id number]) -------------------------------------------------------------------------------- Pain Assessment Details Patient Name: [name], [name] Date of Service: 10/09/2014 3:00 PM Medical Record Number: [id number] Patient Account Number: [id number] Date of Birth/Sex: [birth year] (81 y.o. Female) Treating RN: Afful, RN, BSN, [employer]  Primary Care Physician: Ria Bush Other Clinician: Referring Physician: Ria Bush Treating Physician/Extender: Frann Rider in Treatment: 7 Active Problems Location of Pain Severity and Description of Pain Patient Has [name] No Site Locations With Dressing Change: No Pain Management and Medication Current Pain Management: Electronic Signature(s) Signed: 10/09/2014 4:18:25 PM By: Regan Lemming BSN, RN Entered By: Regan Lemming on  10/09/2014 15:01:53 Patnaude, [name] ([id number]) -------------------------------------------------------------------------------- Patient/Caregiver Education Details Patient Name: [name], [name] Date of Service: 10/09/2014 3:00 PM Medical Record Number: [id number] Patient Account Number: [id number] Date of Birth/Gender: 1927/04/30 (81 y.o. Female) Treating RN: Afful, RN, BSN, Velva Harman Primary Care Physician: Ria Bush Other Clinician: Referring Physician: Ria Bush Treating Physician/Extender: Frann Rider in Treatment: 7 Education Assessment Education Provided To: Patient Education Topics Provided Welcome To [facility]: Methods: Explain/Verbal Electronic Signature(s) Signed: 10/09/2014 4:18:25 PM By: Regan Lemming BSN, RN Entered By: Regan Lemming on 10/09/2014 15:31:16 [name], [name] ([id number]) -------------------------------------------------------------------------------- Wound Assessment Details Patient Name: [name], [name] Date of Service: 10/09/2014 3:00 PM Medical Record Number: [id number] Patient Account Number: [id number] Date of Birth/Sex: [birth year] (81 y.o. Female) Treating RN: Afful, RN, BSN, [employer] Primary Care Physician: Ria Bush Other Clinician: Referring Physician: Ria Bush Treating Physician/Extender: Frann Rider in Treatment: 7 Wound Status Wound Number: 1 Primary Venous Leg Ulcer Etiology: Wound Location: Right Lower Leg - Medial Wound Open Wounding Event: Gradually Appeared Status: Date Acquired: 04/04/2014 Comorbid Cataracts, Anemia, Coronary Artery Weeks Of Treatment: 7 History: Disease, Hypertension, Myocardial Clustered Wound: No Infarction, Type II Diabetes, Osteoarthritis Photos Photo Uploaded By: Regan Lemming on 10/09/2014 16:15:53 Wound Measurements Length: (cm) 5.5 Width: (cm) 4 Depth: (cm) 0.2 Area: (cm) 17.279 Volume: (cm) 3.456 % Reduction in Area: -193.4% % Reduction in  Volume: -95.6% Epithelialization: Small (1-33%) Tunneling: No Undermining: No Wound Description Full Thickness Without Exposed Foul Odor A Classification: Support Structures Diabetic Severity Grade 1 (Wagner): Wound Margin: Flat and Intact Exudate Amount: Large Exudate Type: Serosanguineous Exudate Color: red, brown fter Cleansing: No Wound Bed Rudnicki, Jaya [name]. ([id number]) Granulation Amount: Large (67-100%) Exposed Structure Granulation Quality: Red, Pink Fascia Exposed: No Necrotic Amount: Small (1-33%) Fat Layer Exposed: No Necrotic Quality: Adherent Slough Tendon Exposed: No Muscle Exposed: No Joint Exposed: No Bone Exposed: No Limited to Skin Breakdown Periwound Skin Texture Texture Color No Abnormalities Noted: No No Abnormalities Noted: No Callus: No Atrophie Blanche: No Crepitus: No Cyanosis: No Excoriation: No Ecchymosis: No Fluctuance: No Erythema: No Friable: No Hemosiderin Staining: No Induration: No Mottled: No Localized Edema: No Pallor: No Rash: No Rubor: No Scarring: No Temperature / Pain Moisture Temperature: No Abnormality No Abnormalities Noted: No Tenderness on Palpation: Yes Dry / Scaly: No Maceration: Yes Moist: Yes Wound Preparation Ulcer Cleansing: Other: soap and water, Topical Anesthetic Applied: Other: lidociane 4%, Treatment Notes Wound #1 (Right, Medial Lower Leg) 1. Cleansed with: Cleanse wound with antibacterial soap and water No shower or tub bath May shower with protection 3. Peri-wound Care: Barrier cream 4. Dressing Applied: Aquacel Ag 5. Secondary Dressing Applied ABD Pad 7. Secured with 2 Layer Lite Compression System - Right Lower Extremity Electronic Signature(s) TAISHMARA, SADUSKY ([id number]) Signed: 10/09/2014 4:18:25 PM By: Regan Lemming BSN, RN Entered By: Regan Lemming on 10/09/2014 15:07:00 Cona, [name]  ([id number]) -------------------------------------------------------------------------------- Vitals Details Patient Name: Droz, Shanigua [name]. Date of Service: 10/09/2014 3:00 PM Medical Record Number: [id number] Patient Account Number: [id number] Date of Birth/Sex: 1927-05-09 (81 y.o. Female) Treating RN: Afful, RN, BSN, Velva Harman Primary Care Physician: Danise Mina,  JAVIER Other Clinician: Referring Physician: Ria Bush Treating Physician/Extender: Frann Rider in Treatment: 7 Vital Signs Time Taken: 15:02 Temperature (F): 97.7 Height (in): 58 Pulse (bpm): 73 Weight (lbs): 151 Respiratory Rate (breaths/min): 17 Body Mass Index (BMI): 31.6 Blood Pressure (mmHg): 120/47 Reference Range: 80 - 120 mg / dl Electronic Signature(s) Signed: 10/09/2014 4:18:25 PM By: Regan Lemming BSN, RN Entered By: Regan Lemming on 10/09/2014 15:02:18

## 2014-10-13 ENCOUNTER — Ambulatory Visit: Payer: Medicare Other | Admitting: Endocrinology

## 2014-10-13 ENCOUNTER — Other Ambulatory Visit: Payer: Self-pay | Admitting: Family Medicine

## 2014-10-13 ENCOUNTER — Telehealth: Payer: Self-pay | Admitting: Endocrinology

## 2014-10-13 NOTE — Telephone Encounter (Signed)
Patient no showed today's appt. Please advise on how to follow up. °A. No follow up necessary. °B. Follow up urgent. Contact patient immediately. °C. Follow up necessary. Contact patient and schedule visit in ___ days. °D. Follow up advised. Contact patient and schedule visit in ____weeks. ° °

## 2014-10-14 ENCOUNTER — Encounter: Payer: Self-pay | Admitting: *Deleted

## 2014-10-14 NOTE — Telephone Encounter (Signed)
Letter mailed

## 2014-10-16 ENCOUNTER — Encounter: Payer: Medicare Other | Admitting: Surgery

## 2014-10-16 DIAGNOSIS — I87331 Chronic venous hypertension (idiopathic) with ulcer and inflammation of right lower extremity: Secondary | ICD-10-CM | POA: Diagnosis not present

## 2014-10-16 DIAGNOSIS — E11610 Type 2 diabetes mellitus with diabetic neuropathic arthropathy: Secondary | ICD-10-CM | POA: Diagnosis not present

## 2014-10-16 DIAGNOSIS — L97911 Non-pressure chronic ulcer of unspecified part of right lower leg limited to breakdown of skin: Secondary | ICD-10-CM | POA: Diagnosis not present

## 2014-10-16 DIAGNOSIS — I10 Essential (primary) hypertension: Secondary | ICD-10-CM | POA: Diagnosis not present

## 2014-10-16 DIAGNOSIS — L97811 Non-pressure chronic ulcer of other part of right lower leg limited to breakdown of skin: Secondary | ICD-10-CM | POA: Diagnosis not present

## 2014-10-16 DIAGNOSIS — E11622 Type 2 diabetes mellitus with other skin ulcer: Secondary | ICD-10-CM | POA: Diagnosis not present

## 2014-10-16 DIAGNOSIS — L03115 Cellulitis of right lower limb: Secondary | ICD-10-CM | POA: Diagnosis not present

## 2014-10-17 NOTE — Progress Notes (Signed)
HALA, IVORY ([id number]) Visit Report for 10/16/2014 Arrival Information Details Patient Name: [name], [name] Date of Service: 10/16/2014 3:45 PM Medical Record Number: [id number] Patient Account Number: [id number] Date of Birth/Sex: [name] (81 y.o. Female) Treating RN: Cornell Barman Primary Care Physician: Ria Bush Other Clinician: Referring Physician: Ria Bush Treating Physician/Extender: Frann Rider in Treatment: 8 Visit Information History Since Last Visit Added or deleted any medications: No Patient Arrived: [name] Any new allergies or adverse reactions: No Arrival Time: 15:42 Had a fall or experienced change in No Accompanied By: self activities of daily living that [name] affect Transfer Assistance: None risk of falls: Patient Identification Verified: Yes Signs or symptoms of abuse/neglect since last No Secondary Verification Process Yes visito Completed: Has Dressing in Place as Prescribed: Yes Patient Has Alerts: Yes Has Compression in Place as Prescribed: Yes Patient Alerts: Patient on Blood Pain Present Now: No Thinner Type II Diabetic 81 MG aspirin Electronic Signature(s) Signed: 10/16/2014 5:55:09 PM By: Gretta Cool, RN, BSN, Kim RN, BSN Entered By: Gretta Cool, RN, BSN, Kim on 10/16/2014 15:43:01 [name], [name] ([id number]) -------------------------------------------------------------------------------- Encounter Discharge Information Details Patient Name: [name], [name] Date of Service: 10/16/2014 3:45 PM Medical Record Number: [id number] Patient Account Number: [id number] Date of Birth/Sex: [name] (81 y.o. Female) Treating RN: Cornell Barman Primary Care Physician: Ria Bush Other Clinician: Referring Physician: Ria Bush Treating Physician/Extender: Frann Rider in Treatment: 8 Encounter Discharge Information Items Discharge Pain Level: 0 Discharge Condition: Stable Ambulatory Status: Walker Discharge  Destination: Home Private Transportation: Auto son in [place] By: lobby Schedule Follow-up Appointment: Yes Medication Reconciliation completed and Yes provided to Patient/Care [name]: Clinical Summary of Care: Electronic Signature(s) Signed: 10/16/2014 5:55:09 PM By: Gretta Cool, RN, BSN, Kim RN, BSN Entered By: Gretta Cool, RN, BSN, Kim on 10/16/2014 16:46:28 Glauber, [name] ([id number]) -------------------------------------------------------------------------------- Lower Extremity Assessment Details Patient Name: Lorenz, Baylen C. Date of Service: 10/16/2014 3:45 PM Medical Record Number: [id number] Patient Account Number: [id number] Date of Birth/Sex: [name] (81 y.o. Female) Treating RN: Cornell Barman Primary Care Physician: Ria Bush Other Clinician: Referring Physician: Ria Bush Treating Physician/Extender: Frann Rider in Treatment: 8 Edema Assessment Assessed: [Left: No] [Right: No] E[Left: dema] [Right: :] Calf Left: Right: Point of Measurement: 27 cm From Medial Instep cm 25.7 cm Ankle Left: Right: Point of Measurement: 10 cm From Medial Instep cm 20.3 cm Vascular Assessment Pulses: Posterior Tibial Palpable: [Right:Yes] Dorsalis Pedis Palpable: [Right:Yes] Extremity colors, hair growth, and conditions: Extremity Color: [Right:Mottled] Hair Growth on Extremity: [Right:No] Temperature of Extremity: [Right:Warm] Capillary Refill: [Right:< 3 seconds] Toe Nail Assessment Left: Right: Thick: Yes Discolored: Yes Deformed: Yes Improper Length and Hygiene: No Electronic Signature(s) Signed: 10/16/2014 5:55:09 PM By: Gretta Cool, RN, BSN, Kim RN, BSN Entered By: Gretta Cool, RN, BSN, Kim on 10/16/2014 15:49:38 [name], [name] ([id number]) [name] ([id number]) -------------------------------------------------------------------------------- Multi Wound Chart Details Patient Name: Connelly, Nikiah C. Date of Service: 10/16/2014 3:45  PM Medical Record Number: [id number] Patient Account Number: [id number] Date of Birth/Sex: [name] (81 y.o. Female) Treating RN: Cornell Barman Primary Care Physician: Ria Bush Other Clinician: Referring Physician: Ria Bush Treating Physician/Extender: Frann Rider in Treatment: 8 Vital Signs Height(in): 58 Pulse(bpm): 67 Weight(lbs): 151 Blood Pressure 141/57 (mmHg): Body Mass Index(BMI): 32 Temperature(F): 98.1 Respiratory Rate 18 (breaths/min): Photos: [1:No Photos] [N/A:N/A] Wound Location: [1:Right Lower Leg - Medial] [N/A:N/A] Wounding Event: [1:Gradually Appeared] [N/A:N/A] Primary Etiology: [1:Venous Leg Ulcer] [N/A:N/A] Comorbid History: [1:Cataracts, Anemia, Coronary Artery Disease, Hypertension, Myocardial Infarction, Type II Diabetes, Osteoarthritis] [N/A:N/A] Date Acquired: [  1:04/04/2014] [N/A:N/A] Weeks of Treatment: [1:8] [N/A:N/A] Wound Status: [1:Open] [N/A:N/A] Measurements L x W x D 5.3x3.5x0.2 [N/A:N/A] (cm) Area (cm) : [1:14.569] [N/A:N/A] Volume (cm) : [1:2.914] [N/A:N/A] % Reduction in Area: [1:-147.40%] [N/A:N/A] % Reduction in Volume: -64.90% [N/A:N/A] Classification: [1:Full Thickness Without Exposed Support Structures] [N/A:N/A] HBO Classification: [1:Grade 1] [N/A:N/A] Exudate Amount: [1:Large] [N/A:N/A] Exudate Type: [1:Serosanguineous] [N/A:N/A] Exudate Color: [1:red, brown] [N/A:N/A] Wound Margin: [1:Flat and Intact] [N/A:N/A] Granulation Amount: [1:Large (67-100%)] [N/A:N/A] Granulation Quality: [1:Red, Pink] [N/A:N/A] Necrotic Amount: [1:Small (1-33%)] [N/A:N/A] Exposed Structures: [N/A:N/A] Fascia: No Fat: No Tendon: No Muscle: No Joint: No Bone: No Limited to Skin Breakdown Epithelialization: Small (1-33%) N/A N/A Periwound Skin Texture: Edema: No N/A N/A Excoriation: No Induration: No Callus: No Crepitus: No Fluctuance: No Friable: No Rash: No Scarring: No Periwound Skin Maceration: Yes N/A  N/A Moisture: Moist: Yes Dry/Scaly: No Periwound Skin Color: Atrophie Blanche: No N/A N/A Cyanosis: No Ecchymosis: No Erythema: No Hemosiderin Staining: No Mottled: No Pallor: No Rubor: No Temperature: No Abnormality N/A N/A Tenderness on Yes N/A N/A Palpation: Wound Preparation: Ulcer Cleansing: Other: N/A N/A soap and water Topical Anesthetic Applied: Other: lidocaine 4% Treatment Notes Electronic Signature(s) Signed: 10/16/2014 5:55:09 PM By: Gretta Cool, RN, BSN, Kim RN, BSN Entered By: Gretta Cool, RN, BSN, Kim on 10/16/2014 15:50:46 [name], [name] ([id number]) -------------------------------------------------------------------------------- [place] Details Patient Name: [name], [name] Date of Service: 10/16/2014 3:45 PM Medical Record Number: [id number] Patient Account Number: [id number] Date of Birth/Sex: [name] [name] (81 y.o. Female) Treating RN: Cornell Barman Primary Care Physician: Ria Bush Other Clinician: Referring Physician: Ria Bush Treating Physician/Extender: Frann Rider in Treatment: 8 Active Inactive Abuse / Safety / Falls / Self Care Management Nursing Diagnoses: Potential for falls Goals: Patient will remain injury free Date Initiated: 08/15/2014 Goal Status: Active Interventions: Assess fall risk on admission and as needed Notes: Nutrition Nursing Diagnoses: Impaired glucose control: actual or potential Goals: Patient/caregiver verbalizes understanding of need to maintain therapeutic glucose control per primary care physician Date Initiated: 08/15/2014 Goal Status: Active Interventions: Assess patient nutrition upon admission and as needed per policy Notes: Orientation to the Wound Care Program Nursing Diagnoses: Knowledge deficit related to the wound healing center program Goals: Patient/caregiver will verbalize understanding of the [place], Yalissa C. ([id number]) Date  Initiated: 08/15/2014 Goal Status: Active Interventions: Provide education on orientation to the wound center Notes: Venous Leg Ulcer Nursing Diagnoses: Actual venous Insuffiency (use after diagnosis is confirmed) Goals: Patient will maintain optimal edema control Date Initiated: 08/15/2014 Goal Status: Active Interventions: Compression as ordered Treatment Activities: Test ordered outside of clinic : 10/16/2014 Notes: Wound/Skin Impairment Nursing Diagnoses: Impaired tissue integrity Goals: Patient/caregiver will verbalize understanding of skin care regimen Date Initiated: 08/15/2014 Goal Status: Active Interventions: Assess patient/caregiver ability to obtain necessary supplies Notes: Electronic Signature(s) Signed: 10/16/2014 5:55:09 PM By: Gretta Cool, RN, BSN, Kim RN, BSN Entered By: Gretta Cool, RN, BSN, Kim on 10/16/2014 15:50:40 [name], [name] ([id number]) -------------------------------------------------------------------------------- Pain Assessment Details Patient Name: [name], [name] Date of Service: 10/16/2014 3:45 PM Medical Record Number: [id number] Patient Account Number: [id number] Date of Birth/Sex: [birth year] (81 y.o. Female) Treating RN: Cornell Barman Primary Care Physician: Ria Bush Other Clinician: Referring Physician: Ria Bush Treating Physician/Extender: Frann Rider in Treatment: 8 Active Problems Location of Pain Severity and Description of Pain Patient Has [name] No Site Locations Pain Management and Medication Current Pain Management: Electronic Signature(s) Signed: 10/16/2014 5:55:09 PM By: Gretta Cool, RN, BSN, Kim RN, BSN Entered By: Gretta Cool, RN, BSN,  Kim on 10/16/2014 15:43:06 [name], [name] ([id number]) -------------------------------------------------------------------------------- Patient/Caregiver Education Details Patient Name: [name], [name] Date of Service: 10/16/2014 3:45 PM Medical Record Number: [id number] Patient  Account Number: [id number] Date of Birth/Gender: [birth year] (81 y.o. Female) Treating RN: Cornell Barman Primary Care Physician: Ria Bush Other Clinician: Referring Physician: Ria Bush Treating Physician/Extender: Frann Rider in Treatment: 8 Education Assessment Education Provided To: Patient Education Topics Provided Wound/Skin Impairment: Handouts: Caring for Your Ulcer, Other: leave dressing in place until next wound care visit Methods: Demonstration, Explain/Verbal Responses: State content correctly Electronic Signature(s) Signed: 10/16/2014 5:55:09 PM By: Gretta Cool, RN, BSN, Kim RN, BSN Entered By: Gretta Cool, RN, BSN, Kim on 10/16/2014 16:47:01 [name], [name] ([id number]) -------------------------------------------------------------------------------- Wound Assessment Details Patient Name: [name], [name] Date of Service: 10/16/2014 3:45 PM Medical Record Number: [id number] Patient Account Number: [id number] Date of Birth/Sex: [name] (81 y.o. Female) Treating RN: Cornell Barman Primary Care Physician: Ria Bush Other Clinician: Referring Physician: Ria Bush Treating Physician/Extender: Frann Rider in Treatment: 8 Wound Status Wound Number: 1 Primary Venous Leg Ulcer Etiology: Wound Location: Right Lower Leg - Medial Wound Open Wounding Event: Gradually Appeared Status: Date Acquired: 04/04/2014 Comorbid Cataracts, Anemia, Coronary Artery Weeks Of Treatment: 8 History: Disease, Hypertension, Myocardial Clustered Wound: No Infarction, Type II Diabetes, Osteoarthritis Wound Measurements Length: (cm) 5.3 Width: (cm) 3.5 Depth: (cm) 0.2 Area: (cm) 14.569 Volume: (cm) 2.914 % Reduction in Area: -147.4% % Reduction in Volume: -64.9% Epithelialization: Small (1-33%) Tunneling: No Undermining: No Wound Description Full Thickness Without Exposed Foul Odor A Classification: Support Structures Diabetic Severity Grade  1 (Wagner): Wound Margin: Flat and Intact Exudate Amount: Large Exudate Type: Serosanguineous Exudate Color: red, brown fter Cleansing: No Wound Bed Granulation Amount: Large (67-100%) Exposed Structure Granulation Quality: Red, Pink Fascia Exposed: No Necrotic Amount: Small (1-33%) Fat Layer Exposed: No Necrotic Quality: Adherent Slough Tendon Exposed: No Muscle Exposed: No Joint Exposed: No Bone Exposed: No Limited to Skin Breakdown Periwound Skin Texture Texture Color [name], [name] ([id number]) No Abnormalities Noted: No No Abnormalities Noted: No Callus: No Atrophie Blanche: No Crepitus: No Cyanosis: No Excoriation: No Ecchymosis: No Fluctuance: No Erythema: No Friable: No Hemosiderin Staining: No Induration: No Mottled: No Localized Edema: No Pallor: No Rash: No Rubor: No Scarring: No Temperature / Pain Moisture Temperature: No Abnormality No Abnormalities Noted: No Tenderness on Palpation: Yes Dry / Scaly: No Maceration: Yes Moist: Yes Wound Preparation Ulcer Cleansing: Other: soap and water, Topical Anesthetic Applied: Other: lidocaine 4%, Treatment Notes Wound #1 (Right, Medial Lower Leg) 1. Cleansed with: Clean wound with Normal Saline 2. Anesthetic Topical 2.5%/2.5% Lidocaine and Prilocaine Cream 7. Secured with 2 Layer Lite Compression System - Right Lower Extremity Notes Drawtex, ABD Electronic Signature(s) Signed: 10/16/2014 5:55:09 PM By: Gretta Cool, RN, BSN, Kim RN, BSN Entered By: Gretta Cool, RN, BSN, Kim on 10/16/2014 15:48:11 Demetro, [name] ([id number]) -------------------------------------------------------------------------------- [place] Details Patient Name: Hausen, Brenley C. Date of Service: 10/16/2014 3:45 PM Medical Record Number: [id number] Patient Account Number: [id number] Date of Birth/Sex: [name] (81 y.o. Female) Treating RN: Cornell Barman Primary Care Physician: Ria Bush Other Clinician: Referring Physician:  Ria Bush Treating Physician/Extender: Frann Rider in Treatment: 8 Vital Signs Time Taken: 15:43 Temperature (F): 98.1 Height (in): 58 Pulse (bpm): 67 Weight (lbs): 151 Respiratory Rate (breaths/min): 18 Body Mass Index (BMI): 31.6 Blood Pressure (mmHg): 141/57 Reference Range: 80 - 120 mg / dl Electronic Signature(s) Signed: 10/16/2014 5:55:09 PM By: Gretta Cool, RN, BSN, Kim RN, BSN Entered By: Gretta Cool, RN, BSN,  Kim on 10/16/2014 15:43:58

## 2014-10-17 NOTE — Progress Notes (Signed)
[name], [name] ([id number]) Visit Report for 10/16/2014 Chief Complaint Document Details Patient Name: [name], [name] Date of Service: 10/16/2014 3:45 PM Medical Record Number: [id number] Patient Account Number: [id number] Date of Birth/Sex: Aug 19, 1927 (81 y.o. Female) Treating RN: Primary Care Physician: Ria Bush Other Clinician: Referring Physician: Ria Bush Treating Physician/Extender: Frann Rider in Treatment: 8 Information Obtained from: Patient Chief Complaint Patient presents for treatment of an open ulcer due to venous insufficiency. 81 year old patient who is known to me from the [facility] wound care center now comes in follow up here for a chronic venous ulceration of her right lower extremity which she's had for about 6 months. Electronic Signature(s) Signed: 10/16/2014 4:57:42 PM By: Christin Fudge MD, FACS Entered By: Christin Fudge on 10/16/2014 16:57:42 Chausse, [name] ([id number]) -------------------------------------------------------------------------------- Cellular or Tissue Based Product Details Patient Name: [name], [name] Date of Service: 10/16/2014 3:45 PM Medical Record Number: [id number] Patient Account Number: [id number] Date of Birth/Sex: 1927-08-21 (81 y.o. Female) Treating RN: Primary Care Physician: Ria Bush Other Clinician: Referring Physician: Ria Bush Treating Physician/Extender: Frann Rider in Treatment: 8 Cellular or Tissue Based Wound #1 Right,Medial Lower Leg Product Type Applied to: Performed By: Physician Pat Patrick., MD Cellular or Tissue Based Apligraf Product Type: Time-Out Taken: Yes Location: trunk / arms / legs Wound Size (sq cm): 18.55 Product Size (sq cm): 44 Waste Size (sq cm): 25 Waste Reason: wound size Amount of Product Applied (sq cm): 19 Lot #: [id number] Order #[id number] Expiration Date: 10/23/2014 Fenestrated: Yes Instrument:  Blade Reconstituted: No Secured: Yes Secured With: Steri-Strips Dressing Applied: Yes Primary Dressing: Drawtex Procedural Pain: 0 Post Procedural Pain: 0 Response to Treatment: Procedure was tolerated well Electronic Signature(s) Signed: 10/16/2014 4:57:36 PM By: Christin Fudge MD, FACS Entered By: Christin Fudge on 10/16/2014 16:57:36 Banker, [name] ([id number]) -------------------------------------------------------------------------------- HPI Details Patient Name: [name], [name] Date of Service: 10/16/2014 3:45 PM Medical Record Number: [id number] Patient Account Number: [id number] Date of Birth/Sex: [birth year] (81 y.o. Female) Treating RN: Primary Care Physician: Ria Bush Other Clinician: Referring Physician: Ria Bush Treating Physician/Extender: Frann Rider in Treatment: 8 History of Present Illness Location: ulcer in the right lower extremity for several months Quality: Patient reports experiencing a dull pain to affected area(s). Severity: Patient states wound (s) are getting better. Duration: Patient has had the wound for > 3 months prior to seeking treatment at the wound center Timing: Pain in wound is Intermittent (comes and goes Context: The wound appeared gradually over time Modifying Factors: Consults to this date include: vascular surgery for the veins in December 2015 and she's been seeing the wound care center at [facility] for several months. Associated Signs and Symptoms: Patient reports having difficulty standing for long periods. HPI Description: This is an 81 yrs old female we have been following since the beginning of January for a wound on the right medial ankle. She had previously had venous ablation of the greater saphenous vein by Dr. Kellie Simmering. She came to [place] with erythema of the right leg and subsequently is opened in the medial aspect of the right ankle. She has not been very compliant with compression. We ordered her at  juxta lite stocking, however she apparently has not been able to wear it. The area on the medial aspect of her left ankle has much less induration and erythema. The edema is better controlled. No evidence of infection is noted. 07/18/14; no major change in the condition of this wound. There is still surface slough  and probably some drainage. 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 All other veins showed no evidence of DVT, superficial venous thrombosis or incompetence. however she does have manifestations of chronic venous hypertension and I believe we will change her dressing over to Prisma and a Profore light dressing. she sees her primary care doctor regularly and her last hemoglobin A1c has been around 7%.her other comorbidities are hypertension, CABG in 1998, peptic ulcer disease, history of echo with a ejection fraction of 55-60% and grade 1 diastolic dysfunction. past surgical history include coronary artery bypass graft in 1998, cholecystectomy, appendectomy, hand surgery, endovenous ablation of the saphenous vein with laser by Dr. Kellie Simmering in December 2015. 08/29/2014 - she complains of increased calf pain over the past week. removed her compression bandage and pain improved. no fever or chills. Mild clear drainage. 09/04/2014 -- she has not been compliant and has not done a dressing change as we had ordered last week. She always complains and does not keep her wrap on and this past week she has done the same. She has completed a course of doxycycline. 09/25/2014 -- she has tolerated her dressing all week and has not removed it. She says it's hurting a lot today and would not like any debridement. We will do so as per her wishes. she did get a appointment with the vascular surgeon and sees him on 6/28. [name], [name] ([id number]) 10/03/2014 - She was seen by Dr.  Tinnie Gens 09/30/2014. Assessment: I reviewed the previous venous duplex exam study performed in September 2015 which reveals no options for treating this venous ulcer. There is no significant superficial venous reflux to treat with laser ablation.Most recent ABI was 0.91 in the right foot with palpable dorsalis pedis pulse at 3+ Plan: patient needs to return to the [place] wound center and be treated by Dr. Con Memos. No arterial or venous options available from vascular standpoint or treatment of right leg stasis ulcer.Continued dressing changes and compression is the treatment of choice 10/16/2014 -- today will be her first application of Apligraf. Electronic Signature(s) Signed: 10/16/2014 4:58:07 PM By: Christin Fudge MD, FACS Entered By: Christin Fudge on 10/16/2014 16:58:07 Sarate, [name] ([id number]) -------------------------------------------------------------------------------- Physical Exam Details Patient Name: Kirkes, Onnie C. Date of Service: 10/16/2014 3:45 PM Medical Record Number: [id number] Patient Account Number: [id number] Date of Birth/Sex: 1927-06-09 (81 y.o. Female) Treating RN: Primary Care Physician: Ria Bush Other Clinician: Referring Physician: Ria Bush Treating Physician/Extender: Frann Rider in Treatment: 8 Constitutional . Pulse regular. Respirations normal and unlabored. Afebrile. . Eyes Nonicteric. Reactive to light. Ears, Nose, Mouth, and Throat Lips, teeth, and gums WNL.Marland Kitchen Moist mucosa without lesions . Neck supple and nontender. No palpable supraclavicular or cervical adenopathy. Normal sized without goiter. Respiratory WNL. No retractions.. Cardiovascular Pedal Pulses WNL. No clubbing, cyanosis or edema. Musculoskeletal Adexa without tenderness or enlargement.. Digits and nails w/o clubbing, cyanosis, infection, petechiae, ischemia, or inflammatory conditions.. Integumentary (Hair, Skin) No suspicious lesions. No  crepitus or fluctuance. No peri-wound warmth or erythema. No masses.Marland Kitchen Psychiatric Judgement and insight Intact.. No evidence of depression, anxiety, or agitation.. Notes the wound is nice and clean and good granulation tissue which is freshened prior to the application of Apligraf. Electronic Signature(s) Signed: 10/16/2014 4:58:39 PM By: Christin Fudge MD, FACS Entered By: Christin Fudge on 10/16/2014 16:58:39 Vantassell, [name] ([id number]) -------------------------------------------------------------------------------- Physician Orders Details  Patient Name: Glasper, Deari C. Date of Service: 10/16/2014 3:45 PM Medical Record Number: [id number] Patient Account Number: [id number] Date of Birth/Sex: 03/26/1928 (81 y.o. Female) Treating RN: Cornell Barman Primary Care Physician: Ria Bush Other Clinician: Referring Physician: Ria Bush Treating Physician/Extender: Frann Rider in Treatment: 8 Verbal / Phone Orders: Yes Clinician: Cornell Barman Read Back and Verified: Yes Diagnosis Coding Wound Cleansing Wound #1 Right,Medial Lower Leg o Cleanse wound with mild soap and water Anesthetic Wound #1 Right,Medial Lower Leg o Topical Lidocaine 4% cream applied to wound bed prior to debridement Primary Wound Dressing Wound #1 Right,Medial Lower Leg o Drawtex Secondary Dressing o ABD pad Dressing Change Frequency o Change dressing every week - Do not disturb wound dressing Follow-up Appointments Wound #1 Right,Medial Lower Leg o Return Appointment in 1 week. Edema Control o 2 Layer Compression System - Right Lower Extremity Advanced Therapies Wound #1 Right,Medial Lower Leg o Apligraf application in clinic; including contact layer, fixation with steri strips, dry gauze and cover dressing. Electronic Signature(s) Signed: 10/16/2014 5:01:54 PM By: Christin Fudge MD, FACS Signed: 10/16/2014 5:55:09 PM By: Gretta Cool RN, BSN, Kim RN, BSN Entered By: Gretta Cool, RN,  BSN, Kim on 10/16/2014 16:44:56 Atwater, [name] ([id number]) -------------------------------------------------------------------------------- Problem List Details Patient Name: Tilley, Saquoia C. Date of Service: 10/16/2014 3:45 PM Medical Record Number: [id number] Patient Account Number: [id number] Date of Birth/Sex: Oct 30, 1927 (81 y.o. Female) Treating RN: Primary Care Physician: Ria Bush Other Clinician: Referring Physician: Ria Bush Treating Physician/Extender: Frann Rider in Treatment: 8 Active Problems ICD-10 Encounter Code Description Active Date Diagnosis E11.622 Type 2 diabetes mellitus with other skin ulcer 08/15/2014 Yes E11.610 Type 2 diabetes mellitus with diabetic neuropathic 08/15/2014 Yes arthropathy I87.331 Chronic venous hypertension (idiopathic) with ulcer and 08/15/2014 Yes inflammation of right lower extremity L03.115 Cellulitis of right lower limb 08/29/2014 Yes Inactive Problems Resolved Problems Electronic Signature(s) Signed: 10/16/2014 4:57:23 PM By: Christin Fudge MD, FACS Entered By: Christin Fudge on 10/16/2014 16:57:23 Greenfeld, [name] ([id number]) -------------------------------------------------------------------------------- Progress Note Details Patient Name: Grisanti, Raevyn C. Date of Service: 10/16/2014 3:45 PM Medical Record Number: [id number] Patient Account Number: [id number] Date of Birth/Sex: [birth year] (81 y.o. Female) Treating RN: Primary Care Physician: Ria Bush Other Clinician: Referring Physician: Ria Bush Treating Physician/Extender: Frann Rider in Treatment: 8 Subjective Chief Complaint Information obtained from Patient Patient presents for treatment of an open ulcer due to venous insufficiency. 81 year old patient who is known to me from the [facility] wound care center now comes in follow up here for a chronic venous ulceration of her right lower extremity which she's had for  about 6 months. History of Present Illness (HPI) The following HPI elements were documented for the patient's wound: Location: ulcer in the right lower extremity for several months Quality: Patient reports experiencing a dull pain to affected area(s). Severity: Patient states wound (s) are getting better. Duration: Patient has had the wound for > 3 months prior to seeking treatment at the wound center Timing: Pain in wound is Intermittent (comes and goes Context: The wound appeared gradually over time Modifying Factors: Consults to this date include: vascular surgery for the veins in December 2015 and she's been seeing the wound care center at [facility] for several months. Associated Signs and Symptoms: Patient reports having difficulty standing for long periods. This is an 81 yrs old female we have been following since the beginning of January for a wound on the right medial ankle. She had previously had venous ablation of the greater saphenous vein  by Dr. Kellie Simmering. She came to [place] with erythema of the right leg and subsequently is opened in the medial aspect of the right ankle. She has not been very compliant with compression. We ordered her at juxta lite stocking, however she apparently has not been able to wear it. The area on the medial aspect of her left ankle has much less induration and erythema. The edema is better controlled. No evidence of infection is noted. 07/18/14; no major change in the condition of this wound. There is still surface slough and probably some drainage. 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 All other veins showed no evidence of DVT, superficial venous thrombosis or incompetence. however she does have manifestations of chronic venous hypertension and I believe we will change her dressing over to Prisma and a Profore light dressing. she sees  her primary care doctor regularly and her last hemoglobin A1c has been around 7%.her other comorbidities are hypertension, CABG in 1998, peptic ulcer disease, history of echo with a ejection fraction of 55-60% and grade 1 diastolic dysfunction. past surgical history include coronary artery bypass graft in 1998, cholecystectomy, appendectomy, hand surgery, endovenous ablation of the saphenous vein with laser by Dr. Kellie Simmering in December 2015. LETTI, BALLOG ([id number]) 08/29/2014 - she complains of increased calf pain over the past week. removed her compression bandage and pain improved. no fever or chills. Mild clear drainage. 09/04/2014 -- she has not been compliant and has not done a dressing change as we had ordered last week. She always complains and does not keep her wrap on and this past week she has done the same. She has completed a course of doxycycline. 09/25/2014 -- she has tolerated her dressing all week and has not removed it. She says it's hurting a lot today and would not like any debridement. We will do so as per her wishes. she did get a appointment with the vascular surgeon and sees him on 6/28. 10/03/2014 - She was seen by Dr. Tinnie Gens 09/30/2014. Assessment: I reviewed the previous venous duplex exam study performed in September 2015 which reveals no options for treating this venous ulcer. There is no significant superficial venous reflux to treat with laser ablation.Most recent ABI was 0.91 in the right foot with palpable dorsalis pedis pulse at 3+ Plan: patient needs to return to the [place] wound center and be treated by Dr. Con Memos. No arterial or venous options available from vascular standpoint or treatment of right leg stasis ulcer.Continued dressing changes and compression is the treatment of choice 10/16/2014 -- today will be her first application of Apligraf. Objective Constitutional Pulse regular. Respirations normal and unlabored. Afebrile. Vitals Time  Taken: 3:43 PM, Height: 58 in, Weight: 151 lbs, BMI: 31.6, Temperature: 98.1 F, Pulse: 67 bpm, Respiratory Rate: 18 breaths/min, Blood Pressure: 141/57 mmHg. Eyes Nonicteric. Reactive to light. Ears, Nose, Mouth, and Throat Lips, teeth, and gums WNL.Marland Kitchen Moist mucosa without lesions . Neck supple and nontender. No palpable supraclavicular or cervical adenopathy. Normal sized without goiter. Respiratory WNL. No retractions.. Cardiovascular Pedal Pulses WNL. No clubbing, cyanosis or edema. Morissette, Jasey C. ([id number]) Musculoskeletal Adexa without tenderness or enlargement.. Digits and nails w/o clubbing, cyanosis, infection, petechiae, ischemia, or inflammatory conditions.Marland Kitchen Psychiatric Judgement and insight Intact.. No evidence of depression, anxiety, or agitation.. General Notes: the wound is nice and clean and good granulation tissue which is  freshened prior to the application of Apligraf. Integumentary (Hair, Skin) No suspicious lesions. No crepitus or fluctuance. No peri-wound warmth or erythema. No masses.. Wound #1 status is Open. Original cause of wound was Gradually Appeared. The wound is located on the Right,Medial Lower Leg. The wound measures 5.3cm length x 3.5cm width x 0.2cm depth; 14.569cm^2 area and 2.914cm^3 volume. The wound is limited to skin breakdown. There is no tunneling or undermining noted. There is a large amount of serosanguineous drainage noted. The wound margin is flat and intact. There is large (67-100%) red, pink granulation within the wound bed. There is a small (1-33%) amount of necrotic tissue within the wound bed including Adherent Slough. The periwound skin appearance exhibited: Maceration, Moist. The periwound skin appearance did not exhibit: Callus, Crepitus, Excoriation, Fluctuance, Friable, Induration, Localized Edema, Rash, Scarring, Dry/Scaly, Atrophie Blanche, Cyanosis, Ecchymosis, Hemosiderin Staining, Mottled, Pallor, Rubor, Erythema.  Periwound temperature was noted as No Abnormality. The periwound has tenderness on palpation. Assessment Active Problems ICD-10 E11.622 - Type 2 diabetes mellitus with other skin ulcer E11.610 - Type 2 diabetes mellitus with diabetic neuropathic arthropathy I87.331 - Chronic venous hypertension (idiopathic) with ulcer and inflammation of right lower extremity L03.115 - Cellulitis of right lower limb With the usual sterile precautions the first layer of Apligraf was applied and fixed to the skin with Steri-Strips and appropriate dressing was applied over this and a 2 layer compression wrap was placed. She has been advised about her wound dressing changes and says she will be compliant. He'll come back and see me next week. Terada, Tykiera C. ([id number]) Procedures Wound #1 Wound #1 is a Venous Leg Ulcer located on the Right,Medial Lower Leg. A skin graft procedure using a bioengineered skin substitute/cellular or tissue based product was performed by Abubakr Wieman, Jackson Latino., MD. Apligraf was applied and secured with Steri-Strips. 19 sq cm of product was utilized and 25 sq cm was wasted due to wound size. Post Application, Drawtex was applied. A Time Out was conducted prior to the start of the procedure. The procedure was tolerated well with a pain level of 0 throughout and a pain level of 0 following the procedure. Plan Wound Cleansing: Wound #1 Right,Medial Lower Leg: Cleanse wound with mild soap and water Anesthetic: Wound #1 Right,Medial Lower Leg: Topical Lidocaine 4% cream applied to wound bed prior to debridement Primary Wound Dressing: Wound #1 Right,Medial Lower Leg: Drawtex Secondary Dressing: ABD pad Dressing Change Frequency: Change dressing every week - Do not disturb wound dressing Follow-up Appointments: Wound #1 Right,Medial Lower Leg: Return Appointment in 1 week. Edema Control: 2 Layer Compression System - Right Lower Extremity Advanced Therapies: Wound #1  Right,Medial Lower Leg: Apligraf application in clinic; including contact layer, fixation with steri strips, dry gauze and cover dressing. With the usual sterile precautions the first layer of Apligraf was applied and fixed to the skin with Steri-Strips and appropriate dressing was applied over this and a 2 layer compression wrap was placed. She has been advised about her wound dressing changes and says she will be compliant. He'll come back and see me next week. SWAN, WERNE ([id number]) Electronic Signature(s) Signed: 10/16/2014 4:59:48 PM By: Christin Fudge MD, FACS Entered By: Christin Fudge on 10/16/2014 16:59:48 Schrecengost, [name] ([id number]) -------------------------------------------------------------------------------- SuperBill Details Patient Name: Soberano, Kandas C. Date of Service: 10/16/2014 Medical Record Number: [id number] Patient Account Number: [id number] Date of Birth/Sex: April 03, 1928 (81 y.o. Female) Treating RN: Primary Care Physician: Ria Bush Other Clinician: Referring Physician: Ria Bush Treating Physician/Extender:  Hikeem Andersson Weeks in Treatment: 8 Diagnosis Coding ICD-10 Codes Code Description E11.622 Type 2 diabetes mellitus with other skin ulcer E11.610 Type 2 diabetes mellitus with diabetic neuropathic arthropathy Chronic venous hypertension (idiopathic) with ulcer and inflammation of right lower I87.331 extremity L03.115 Cellulitis of right lower limb Facility Procedures CPT4: Description Modifier Quantity Code [id number] (Facility Use Only) Apligraf 1 SQ CM 62 CPT4: [id number] - SKIN SUB GRAFT TRNK/ARM/LEG 1 ICD-10 Description Diagnosis E11.622 Type 2 diabetes mellitus with other skin ulcer I87.331 Chronic venous hypertension (idiopathic) with ulcer and inflammation of right lower extremity Physician Procedures CPT4: Description Modifier Quantity Code [id number] - WC PHYS SKIN SUB GRAFT TRNK/ARM/LEG 1 ICD-10 Description  Diagnosis E11.622 Type 2 diabetes mellitus with other skin ulcer I87.331 Chronic venous hypertension (idiopathic) with ulcer and  inflammation of right lower extremity Electronic Signature(s) Signed: 10/16/2014 5:00:02 PM By: Christin Fudge MD, FACS Entered By: Christin Fudge on 10/16/2014 17:00:02

## 2014-10-20 ENCOUNTER — Ambulatory Visit: Payer: Medicare Other | Admitting: Surgery

## 2014-10-20 DIAGNOSIS — L03115 Cellulitis of right lower limb: Secondary | ICD-10-CM | POA: Diagnosis not present

## 2014-10-20 DIAGNOSIS — L97911 Non-pressure chronic ulcer of unspecified part of right lower leg limited to breakdown of skin: Secondary | ICD-10-CM | POA: Diagnosis not present

## 2014-10-20 DIAGNOSIS — E11622 Type 2 diabetes mellitus with other skin ulcer: Secondary | ICD-10-CM | POA: Diagnosis not present

## 2014-10-20 DIAGNOSIS — E11610 Type 2 diabetes mellitus with diabetic neuropathic arthropathy: Secondary | ICD-10-CM | POA: Diagnosis not present

## 2014-10-20 DIAGNOSIS — I10 Essential (primary) hypertension: Secondary | ICD-10-CM | POA: Diagnosis not present

## 2014-10-20 DIAGNOSIS — I87331 Chronic venous hypertension (idiopathic) with ulcer and inflammation of right lower extremity: Secondary | ICD-10-CM | POA: Diagnosis not present

## 2014-10-21 NOTE — Progress Notes (Signed)
ZEILA, PERTEET ([id number]) Visit Report for 10/20/2014 Arrival Information Details Patient Name: [name], [name] Date of Service: 10/20/2014 3:45 PM Medical Record Number: [id number] Patient Account Number: [id number] Date of Birth/Sex: 1928/01/28 (81 y.o. Female) Treating RN: Cornell Barman Primary Care Physician: Ria Bush Other Clinician: Referring Physician: Ria Bush Treating Physician/Extender: Frann Rider in Treatment: 9 Visit Information History Since Last Visit Added or deleted any medications: No Patient Arrived: Walker Any new allergies or adverse reactions: No Arrival Time: 15:15 Had a fall or experienced change in No Accompanied By: son in lobby activities of daily living that may affect Transfer Assistance: None risk of falls: Patient Identification Verified: Yes Signs or symptoms of abuse/neglect since last No Secondary Verification Process Yes visito Completed: Hospitalized since last visit: No Patient Has Alerts: Yes Has Dressing in Place as Prescribed: Yes Patient Alerts: Patient on Blood Has Compression in Place as Prescribed: Yes Thinner Pain Present Now: No Type II Diabetic 81 MG aspirin Electronic Signature(s) Signed: 10/20/2014 5:24:22 PM By: Gretta Cool, RN, BSN, Kim RN, BSN Entered By: Gretta Cool, RN, BSN, Kim on 10/20/2014 15:38:02 [name], [name] ([id number]) -------------------------------------------------------------------------------- Encounter Discharge Information Details Patient Name: [name], [name] Date of Service: 10/20/2014 3:45 PM Medical Record Number: [id number] Patient Account Number: [id number] Date of Birth/Sex: [birth year] (81 y.o. Female) Treating RN: Cornell Barman Primary Care Physician: Ria Bush Other Clinician: Referring Physician: Ria Bush Treating Physician/Extender: Frann Rider in Treatment: 9 Encounter Discharge Information Items Discharge Pain Level: 0 Discharge Condition:  Stable Ambulatory Status: Walker Discharge Destination: Home Private Transportation: Auto son in [place] By: lobby Schedule Follow-up Appointment: Yes Medication Reconciliation completed and Yes provided to Patient/Care [name]: Clinical Summary of Care: Electronic Signature(s) Signed: 10/20/2014 5:24:22 PM By: Gretta Cool, RN, BSN, Kim RN, BSN Entered By: Gretta Cool, RN, BSN, Kim on 10/20/2014 15:39:54 [name], [name] ([id number]) -------------------------------------------------------------------------------- General Visit Notes Details Patient Name: [name], [name] Date of Service: 10/20/2014 3:45 PM Medical Record Number: [id number] Patient Account Number: [id number] Date of Birth/Sex: Mar 16, 1928 (81 y.o. Female) Treating RN: Cornell Barman Primary Care Physician: Ria Bush Other Clinician: Referring Physician: Ria Bush Treating Physician/Extender: Frann Rider in Treatment: 9 Notes Patient came in today concerned about her wrap being to tight and a bruise on the back of her leg. I re- wrapped the patient and she felt much better. Will return on Thursday to see Dr. Con Memos. Electronic Signature(s) Signed: 10/20/2014 5:24:22 PM By: Gretta Cool, RN, BSN, Kim RN, BSN Entered By: Gretta Cool, RN, BSN, Kim on 10/20/2014 15:43:04 [name], [name] ([id number]) -------------------------------------------------------------------------------- Patient/Caregiver Education Details Patient Name: [name], [name] Date of Service: 10/20/2014 3:45 PM Medical Record Number: [id number] Patient Account Number: [id number] Date of Birth/Gender: Dec 20, 1927 (81 y.o. Female) Treating RN: Cornell Barman Primary Care Physician: Ria Bush Other Clinician: Referring Physician: Ria Bush Treating Physician/Extender: Frann Rider in Treatment: 9 Education Assessment Education Provided To: Patient Education Topics Provided Wound/Skin Impairment: Handouts: Other: elevate  legs above heart as much as possible, do not remove wrap. Electronic Signature(s) Signed: 10/20/2014 5:24:22 PM By: Gretta Cool, RN, BSN, Kim RN, BSN Entered By: Gretta Cool, RN, BSN, Kim on 10/20/2014 15:39:30

## 2014-10-23 ENCOUNTER — Encounter: Payer: Medicare Other | Admitting: Surgery

## 2014-10-23 DIAGNOSIS — L97911 Non-pressure chronic ulcer of unspecified part of right lower leg limited to breakdown of skin: Secondary | ICD-10-CM | POA: Diagnosis not present

## 2014-10-23 DIAGNOSIS — E11610 Type 2 diabetes mellitus with diabetic neuropathic arthropathy: Secondary | ICD-10-CM | POA: Diagnosis not present

## 2014-10-23 DIAGNOSIS — I87331 Chronic venous hypertension (idiopathic) with ulcer and inflammation of right lower extremity: Secondary | ICD-10-CM | POA: Diagnosis not present

## 2014-10-23 DIAGNOSIS — E11622 Type 2 diabetes mellitus with other skin ulcer: Secondary | ICD-10-CM | POA: Diagnosis not present

## 2014-10-23 DIAGNOSIS — I10 Essential (primary) hypertension: Secondary | ICD-10-CM | POA: Diagnosis not present

## 2014-10-23 DIAGNOSIS — L03115 Cellulitis of right lower limb: Secondary | ICD-10-CM | POA: Diagnosis not present

## 2014-10-23 NOTE — Progress Notes (Signed)
[name], [name] ([id number]) Visit Report for 10/23/2014 Chief Complaint Document Details Patient Name: [name], [name] Date of Service: 10/23/2014 4:00 PM Medical Record Number: [id number] Patient Account Number: [id number] Date of Birth/Sex: 12/06/1927 (81 y.o. Female) Treating RN: Primary Care Physician: [name] Other Clinician: Referring Physician: Ria [name] Treating Physician/Extender: [name] in Treatment: 9 Information Obtained from: Patient Chief Complaint Patient presents for treatment of an open ulcer due to venous insufficiency. 81 year old patient who is known to me from the [facility] wound care center now comes in follow up here for a chronic venous ulceration of her right lower extremity which she's had for about 6 months. Electronic Signature(s) Signed: 10/23/2014 4:11:44 PM By: [name] Entered By: [name] on 10/23/2014 16:11:44 [name], [name] ([id number]) -------------------------------------------------------------------------------- HPI Details Patient Name: [name], [name] Date of Service: 10/23/2014 4:00 PM Medical Record Number: [id number] Patient Account Number: [id number] Date of Birth/Sex: 26-Dec-1927 (81 y.o. Female) Treating RN: Primary Care Physician: [name] Other Clinician: Referring Physician: Ria [name] Treating Physician/Extender: [name] in Treatment: 9 History of Present Illness Location: ulcer in the right lower extremity for several months Quality: Patient reports experiencing a dull pain to affected area(s). Severity: Patient states wound (s) are getting better. Duration: Patient has had the wound for > 3 months prior to seeking treatment at the wound center Timing: Pain in wound is Intermittent (comes and goes Context: The wound appeared gradually over time Modifying Factors: Consults to this date include: vascular surgery for the veins in December 2015  and she's been seeing the wound care center at [facility] for several months. Associated Signs and Symptoms: Patient reports having difficulty standing for long periods. HPI Description: This is an 82 yrs old female we have been following since the beginning of January for a wound on the right medial ankle. She had previously had venous ablation of the greater saphenous vein by [name] She came to [place] with erythema of the right leg and subsequently is opened in the medial aspect of the right ankle. She has not been very compliant with compression. We ordered her at juxta lite stocking, however she apparently has not been able to wear it. The area on the medial aspect of her left ankle has much less induration and erythema. The edema is better controlled. No evidence of infection is noted. 07/18/14; no major change in the condition of this wound. There is still surface slough and probably some drainage. 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 All other veins showed no evidence of DVT, superficial venous thrombosis or incompetence. however she does have manifestations of chronic venous hypertension and I believe we will change her dressing over to Prisma and a Profore light dressing. she sees her primary care doctor regularly and her last hemoglobin A1c has been around 7%.her other comorbidities are hypertension, CABG in 1998, peptic ulcer disease, history of echo with a ejection fraction of 55-60% and grade 1 diastolic dysfunction. past surgical history include coronary artery bypass graft in 1998, cholecystectomy, appendectomy, hand surgery, endovenous ablation of the saphenous vein with laser by [name] in December 2015. 08/29/2014 - she complains of increased calf pain over the past week. removed her compression bandage and pain improved. no fever or chills. Mild  clear drainage. 09/04/2014 -- she has not been compliant and has not done a  dressing change as we had ordered last week. She always complains and does not keep her wrap on and this past week she has done the same. She has completed a course of doxycycline. 09/25/2014 -- she has tolerated her dressing all week and has not removed it. She says it's hurting a lot today and would not like any debridement. We will do so as per her wishes. she did get a appointment with the vascular surgeon and sees him on 6/28. [name], [name] 10/03/2014 - She was seen by [name] 09/30/2014. Assessment: I reviewed the previous venous duplex exam study performed in September 2015 which reveals no options for treating this venous ulcer. There is no significant superficial venous reflux to treat with laser ablation.Most recent ABI was 0.91 in the right foot with palpable dorsalis pedis pulse at 3+ Plan: patient needs to return to the [facility] wound center and be treated by [name] No arterial or venous options available from vascular standpoint or treatment of right leg stasis ulcer.Continued dressing changes and compression is the treatment of choice 10/16/2014 -- today will be her first application of Apligraf. 10/23/2014 -- we are going to be able to apply her second day of Apligraf next week. She is complaining of some tenderness in the region of Achilles tendon and this is possibly due to her wrap. Electronic Signature(s) Signed: 10/23/2014 4:12:20 PM By: [name] Entered By: [name] on 10/23/2014 16:12:19 [name] -------------------------------------------------------------------------------- Physical Exam Details Patient Name: [name], [name] Date of Service: 10/23/2014 4:00 PM Medical Record Number: [id number] Patient Account Number: [id number] Date of Birth/Sex: October 30, 1927 (81 y.o. Female) Treating RN: Primary Care Physician:  [name] Other Clinician: Referring Physician: Ria [name] Treating Physician/Extender: [name] in Treatment: 9 Constitutional . Pulse regular. Respirations normal and unlabored. Afebrile. . Eyes Nonicteric. Reactive to light. Ears, Nose, Mouth, and Throat Lips, teeth, and gums WNL.Marland Kitchen Moist mucosa without lesions . Neck supple and nontender. No palpable supraclavicular or cervical adenopathy. Normal sized without goiter. Respiratory WNL. No retractions.. Breath sounds WNL, No rubs, rales, rhonchi, or wheeze.. Cardiovascular Heart rhythm and rate regular, no murmur or gallop.. Pedal Pulses WNL. No clubbing, cyanosis or edema. Lymphatic No adneopathy. No adenopathy. No adenopathy. Musculoskeletal Adexa without tenderness or enlargement.. Digits and nails w/o clubbing, cyanosis, infection, petechiae, ischemia, or inflammatory conditions.. Integumentary (Hair, Skin) No suspicious lesions. No crepitus or fluctuance. No peri-wound warmth or erythema. No masses.Marland Kitchen Psychiatric Judgement and insight Intact.. No evidence of depression, anxiety, or agitation.. Notes The wound is covered with Apligraf and we have not unveiled it today. Electronic Signature(s) Signed: 10/23/2014 4:13:21 PM By: [name] Entered By: [name] on 10/23/2014 16:13:20 [name], [name] -------------------------------------------------------------------------------- Physician Orders Details Patient Name: [name], [name] Date of Service: 10/23/2014 4:00 PM Medical Record Number: [id number] Patient Account Number: [id number] Date of Birth/Sex: Aug 02, 1927 (81 y.o. Female) Treating RN: Montey Hora Primary Care Physician: [name] Other Clinician: Referring Physician: Ria [name] Treating Physician/Extender: [name] in Treatment: 9 Verbal / Phone Orders: Yes Clinician: Montey Hora Read Back and Verified: Yes Diagnosis  Coding Wound Cleansing Wound #1 Right,Medial Lower Leg o Cleanse wound with mild soap and water Anesthetic Wound #1 Right,Medial Lower Leg o Topical Lidocaine 4% cream applied to wound bed prior to debridement Primary Wound Dressing Wound #1 Right,Medial Lower Leg o Drawtex o Other: - mepilex Secondary Dressing o ABD pad Dressing Change Frequency o Change dressing  every week - Do not disturb wound dressing Follow-up Appointments Wound #1 Right,Medial Lower Leg o Return Appointment in 1 week. Edema Control o 2 Layer Compression System - Right Lower Extremity Notes order apligraf for next week Electronic Signature(s) Signed: 10/23/2014 4:15:37 PM By: [name] Signed: 10/23/2014 4:33:06 PM By: Montey Hora Entered By: Montey Hora on 10/23/2014 16:07:11 [name], [name] -------------------------------------------------------------------------------- Problem List Details Patient Name: [name], [name] Date of Service: 10/23/2014 4:00 PM Medical Record Number: [id number] Patient Account Number: [id number] Date of Birth/Sex: 10/04/1927 (81 y.o. Female) Treating RN: Primary Care Physician: [name] Other Clinician: Referring Physician: Ria [name] Treating Physician/Extender: [name] in Treatment: 9 Active Problems ICD-10 Encounter Code Description Active Date Diagnosis E11.622 Type 2 diabetes mellitus with other skin ulcer 08/15/2014 Yes E11.610 Type 2 diabetes mellitus with diabetic neuropathic 08/15/2014 Yes arthropathy I87.331 Chronic venous hypertension (idiopathic) with ulcer and 08/15/2014 Yes inflammation of right lower extremity L03.115 Cellulitis of right lower limb 08/29/2014 Yes Inactive Problems Resolved Problems Electronic Signature(s) Signed: 10/23/2014 4:11:37 PM By: [name] Entered By: [name] on 10/23/2014 16:11:37 [name], [name]  ([id number]) -------------------------------------------------------------------------------- Progress Note Details Patient Name: [name], [name] Date of Service: 10/23/2014 4:00 PM Medical Record Number: [id number] Patient Account Number: [id number] Date of Birth/Sex: 1928-01-28 (81 y.o. Female) Treating RN: Primary Care Physician: [name] Other Clinician: Referring Physician: Ria [name] Treating Physician/Extender: [name] in Treatment: 9 Subjective Chief Complaint Information obtained from Patient Patient presents for treatment of an open ulcer due to venous insufficiency. 81 year old patient who is known to me from the [facility] wound care center now comes in follow up here for a chronic venous ulceration of her right lower extremity which she's had for about 6 months. History of Present Illness (HPI) The following HPI elements were documented for the patient's wound: Location: ulcer in the right lower extremity for several months Quality: Patient reports experiencing a dull pain to affected area(s). Severity: Patient states wound (s) are getting better. Duration: Patient has had the wound for > 3 months prior to seeking treatment at the wound center Timing: Pain in wound is Intermittent (comes and goes Context: The wound appeared gradually over time Modifying Factors: Consults to this date include: vascular surgery for the veins in December 2015 and she's been seeing the wound care center at [facility] for several months. Associated Signs and Symptoms: Patient reports having difficulty standing for long periods. This is an 81 yrs old female we have been following since the beginning of January for a wound on the right medial ankle. She had previously had venous ablation of the greater saphenous vein by [name] She came to [place] with erythema of the right leg and subsequently is opened in the medial aspect of the right ankle. She has not been very  compliant with compression. We ordered her at juxta lite stocking, however she apparently has not been able to wear it. The area on the medial aspect of her left ankle has much less induration and erythema. The edema is better controlled. No evidence of infection is noted. 07/18/14; no major change in the condition of this wound. There is still surface slough and probably some drainage. 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 All other veins showed no evidence of DVT, superficial venous thrombosis or incompetence. however she does  have manifestations of chronic venous hypertension and I believe we will change her dressing over to Prisma and a Profore light dressing. she sees her primary care doctor regularly and her last hemoglobin A1c has been around 7%.her other comorbidities are hypertension, CABG in 1998, peptic ulcer disease, history of echo with a ejection fraction of 55-60% and grade 1 diastolic dysfunction. past surgical history include coronary artery bypass graft in 1998, cholecystectomy, appendectomy, hand surgery, endovenous ablation of the saphenous vein with laser by [name] in December 2015. [name], [name] ([id number]) 08/29/2014 - she complains of increased calf pain over the past week. removed her compression bandage and pain improved. no fever or chills. Mild clear drainage. 09/04/2014 -- she has not been compliant and has not done a dressing change as we had ordered last week. She always complains and does not keep her wrap on and this past week she has done the same. She has completed a course of doxycycline. 09/25/2014 -- she has tolerated her dressing all week and has not removed it. She says it's hurting a lot today and would not like any debridement. We will do so as per her wishes. she did get a appointment with the vascular surgeon and sees  him on 6/28. 10/03/2014 - She was seen by [name] 09/30/2014. Assessment: I reviewed the previous venous duplex exam study performed in September 2015 which reveals no options for treating this venous ulcer. There is no significant superficial venous reflux to treat with laser ablation.Most recent ABI was 0.91 in the right foot with palpable dorsalis pedis pulse at 3+ Plan: patient needs to return to the [place] wound center and be treated by [name] No arterial or venous options available from vascular standpoint or treatment of right leg stasis ulcer.Continued dressing changes and compression is the treatment of choice 10/16/2014 -- today will be her first application of Apligraf. 10/23/2014 -- we are going to be able to apply her second day of Apligraf next week. She is complaining of some tenderness in the region of Achilles tendon and this is possibly due to her wrap. Objective Constitutional Pulse regular. Respirations normal and unlabored. Afebrile. Vitals Time Taken: 3:51 PM, Height: 58 in, Weight: 151 lbs, BMI: 31.6, Temperature: 98.1 F, Pulse: 78 bpm, Respiratory Rate: 18 breaths/min, Blood Pressure: 143/67 mmHg. Eyes Nonicteric. Reactive to light. Ears, Nose, Mouth, and Throat Lips, teeth, and gums WNL.Marland Kitchen Moist mucosa without lesions . Neck supple and nontender. No palpable supraclavicular or cervical adenopathy. Normal sized without goiter. Respiratory WNL. No retractions.. Breath sounds WNL, No rubs, rales, rhonchi, or wheeze.. Cardiovascular Art, Nonie C. ([id number]) Heart rhythm and rate regular, no murmur or gallop.. Pedal Pulses WNL. No clubbing, cyanosis or edema. Lymphatic No adneopathy. No adenopathy. No adenopathy. Musculoskeletal Adexa without tenderness or enlargement.. Digits and nails w/o clubbing, cyanosis, infection, petechiae, ischemia, or inflammatory conditions.Marland Kitchen Psychiatric Judgement and insight Intact.. No evidence of depression,  anxiety, or agitation.. General Notes: The wound is covered with Apligraf and we have not unveiled it today. Integumentary (Hair, Skin) No suspicious lesions. No crepitus or fluctuance. No peri-wound warmth or erythema. No masses.. Wound #1 status is Open. Original cause of wound was Gradually Appeared. The wound is located on the Right,Medial Lower Leg. The wound measures 5.3cm length x 3.5cm width x 0.2cm depth; 14.569cm^2 area and 2.914cm^3 volume. The wound is limited to skin breakdown. There is a large amount of serosanguineous drainage noted. The wound margin is flat and intact. There is  large (67-100%) red, pink granulation within the wound bed. There is a small (1-33%) amount of necrotic tissue within the wound bed including Adherent Slough. The periwound skin appearance exhibited: Maceration, Moist. The periwound skin appearance did not exhibit: Callus, Crepitus, Excoriation, Fluctuance, Friable, Induration, Localized Edema, Rash, Scarring, Dry/Scaly, Atrophie Blanche, Cyanosis, Ecchymosis, Hemosiderin Staining, Mottled, Pallor, Rubor, Erythema. Periwound temperature was noted as No Abnormality. The periwound has tenderness on palpation. General Notes: Unable to get measurement dt to apiligraf application from last week. Dressing removed only down to meplilex and steristrips Assessment Active Problems ICD-10 E11.622 - Type 2 diabetes mellitus with other skin ulcer E11.610 - Type 2 diabetes mellitus with diabetic neuropathic arthropathy I87.331 - Chronic venous hypertension (idiopathic) with ulcer and inflammation of right lower extremity L03.115 - Cellulitis of right lower limb Bocock, Juliani C. ([id number]) We will continue with a 2 layer light wrap over this and have appropriately padded the area for moisture control and to prevent her from having abrasions on her ankle. He is instructed in elevation of the limb and she will come back and see me next week. Plan Wound  Cleansing: Wound #1 Right,Medial Lower Leg: Cleanse wound with mild soap and water Anesthetic: Wound #1 Right,Medial Lower Leg: Topical Lidocaine 4% cream applied to wound bed prior to debridement Primary Wound Dressing: Wound #1 Right,Medial Lower Leg: Drawtex Other: - mepilex Secondary Dressing: ABD pad Dressing Change Frequency: Change dressing every week - Do not disturb wound dressing Follow-up Appointments: Wound #1 Right,Medial Lower Leg: Return Appointment in 1 week. Edema Control: 2 Layer Compression System - Right Lower Extremity General Notes: order apligraf for next week We will continue with a 2 layer light wrap over this and have appropriately padded the area for moisture control and to prevent her from having abrasions on her ankle. He is instructed in elevation of the limb and she will come back and see me next week. Electronic Signature(s) Signed: 10/23/2014 4:14:39 PM By: [name] Entered By: [name] on 10/23/2014 16:14:39 Mccuen, [name] -------------------------------------------------------------------------------- SuperBill Details Patient Name: Imai, Marites C. Date of Service: 10/23/2014 Medical Record Number: [id number] Patient Account Number: [id number] Date of Birth/Sex: Dec 23, 1927 (81 y.o. Female) Treating RN: Primary Care Physician: [name] Other Clinician: Referring Physician: Ria [name] Treating Physician/Extender: [name] in Treatment: 9 Diagnosis Coding ICD-10 Codes Code Description E11.622 Type 2 diabetes mellitus with other skin ulcer E11.610 Type 2 diabetes mellitus with diabetic neuropathic arthropathy Chronic venous hypertension (idiopathic) with ulcer and inflammation of right lower I87.331 extremity L03.115 Cellulitis of right lower limb Facility Procedures CPT4: Description Modifier Quantity Code [id number] (Facility Use Only) [phone] - [place] [id number] LWR RT 1  LEG Physician Procedures CPT4: Description Modifier Quantity Code [id number] - WC PHYS LEVEL 3 - EST PT 1 ICD-10 Description Diagnosis E11.622 Type 2 diabetes mellitus with other skin ulcer I87.331 Chronic venous hypertension (idiopathic) with ulcer and inflammation of right  lower extremity Electronic Signature(s) Unsigned Previous Signature: 10/23/2014 4:14:57 PM Version By: [name] Entered By: Gretta Cool RN, BSN, Kim on 10/23/2014 17:16:03 Signature(s): Date(s):

## 2014-10-24 NOTE — Progress Notes (Signed)
[name] ([id number]) Visit Report for 10/23/2014 Arrival Information Details Patient Name: [name], [name] Date of Service: 10/23/2014 4:00 PM Medical Record Number: [id number] Patient Account Number: [id number] Date of Birth/Sex: 03/08/1928 (81 y.o. Female) Treating RN: [name] Primary Care Physician: [name] Other Clinician: Referring Physician: Ria [name] Treating Physician/Extender: [name] in Treatment: 9 Visit Information History Since Last Visit Added or deleted any medications: No Patient Arrived: Walker Any new allergies or adverse reactions: No Arrival Time: 15:50 Had a fall or experienced change in No Accompanied By: son in lobby activities of daily living that may affect Transfer Assistance: None risk of falls: Patient Identification Verified: Yes Signs or symptoms of abuse/neglect since last No Secondary Verification Process Yes visito Completed: Hospitalized since last visit: No Patient Has Alerts: Yes Has Dressing in Place as Prescribed: Yes Patient Alerts: Patient on Blood Has Compression in Place as Prescribed: Yes Thinner Pain Present Now: No Type II Diabetic 81 MG aspirin Electronic Signature(s) Signed: 10/23/2014 4:42:55 PM By: [name] Entered By: [name] Cool, RN, [name], [name] on 10/23/2014 15:51:09 [name] ([id number]) -------------------------------------------------------------------------------- Encounter Discharge Information Details Patient Name: [name], [name] Date of Service: 10/23/2014 4:00 PM Medical Record Number: [id number] Patient Account Number: [id number] Date of Birth/Sex: [birth year] (81 y.o. Female) Treating RN: [name] Primary Care Physician: [name] Other Clinician: Referring Physician: Ria [name] Treating Physician/Extender: [name] in Treatment: 9 Encounter Discharge Information Items Discharge Pain Level: 0 Discharge Condition:  Stable Ambulatory Status: Ambulatory Discharge Destination: Home Private Transportation: Auto Accompanied By: self Schedule Follow-up Appointment: Yes Medication Reconciliation completed and Yes provided to Patient/Care [name]: Clinical Summary of Care: Electronic Signature(s) Signed: 10/23/2014 4:42:55 PM By: [name] Entered By: [name] Cool, RN, [name], [name] on 10/23/2014 16:19:26 [name] ([id number]) -------------------------------------------------------------------------------- Lower Extremity Assessment Details Patient Name: [name], [name] Date of Service: 10/23/2014 4:00 PM Medical Record Number: [id number] Patient Account Number: [id number] Date of Birth/Sex: 1927/05/07 (81 y.o. Female) Treating RN: [name] Primary Care Physician: [name] Other Clinician: Referring Physician: Ria [name] Treating Physician/Extender: [name] in Treatment: 9 Edema Assessment Assessed: [Left: No] [Right: No] E[Left: dema] [Right: :] Calf Left: Right: Point of Measurement: 27 cm From Medial Instep cm 26.5 cm Ankle Left: Right: Point of Measurement: 10 cm From Medial Instep cm 21.5 cm Vascular Assessment Pulses: Posterior Tibial Palpable: [Right:Yes] Dorsalis Pedis Palpable: [Right:Yes] Extremity colors, hair growth, and conditions: Extremity Color: [Right:Mottled] Hair Growth on Extremity: [Right:No] Temperature of Extremity: [Right:Warm] Capillary Refill: [Right:< 3 seconds] Toe Nail Assessment Left: Right: Thick: Yes Discolored: Yes Deformed: Yes Improper Length and Hygiene: Yes Electronic Signature(s) Signed: 10/23/2014 4:42:55 PM By: [name] Entered By: [name] Cool, RN, [name], [name] on 10/23/2014 15:55:35 [name] ([id number]) [name] [name] ([id number]) -------------------------------------------------------------------------------- Multi Wound Chart Details Patient Name: [name], [name]  C. Date of Service: 10/23/2014 4:00 PM Medical Record Number: [id number] Patient Account Number: [id number] Date of Birth/Sex: Jan 17, 1928 (81 y.o. Female) Treating RN: [name] Primary Care Physician: [name] Other Clinician: Referring Physician: Ria [name] Treating Physician/Extender: [name] in Treatment: 9 Vital Signs Height(in): 58 Pulse(bpm): 78 Weight(lbs): 151 Blood Pressure 143/67 (mmHg): Body Mass Index(BMI): 32 Temperature(F): 98.1 Respiratory Rate 18 (breaths/min): Photos: [1:No Photos] [N/A:N/A] Wound Location: [1:Right Lower Leg - Medial] [N/A:N/A] Wounding Event: [1:Gradually Appeared] [N/A:N/A] Primary Etiology: [1:Venous Leg Ulcer] [N/A:N/A] Comorbid History: [1:Cataracts, Anemia, Coronary Artery Disease, Hypertension, Myocardial Infarction, Type II  Diabetes, Osteoarthritis] [N/A:N/A] Date Acquired: [1:04/04/2014] [N/A:N/A] Weeks of Treatment: [1:9] [N/A:N/A] Wound Status: [1:Open] [N/A:N/A] Measurements L x W x D 5.3x3.5x0.2 [N/A:N/A] (cm) Area (cm) : [1:14.569] [N/A:N/A] Volume (cm) : [1:2.914] [N/A:N/A] % Reduction in Area: [1:-147.40%] [N/A:N/A] % Reduction in Volume: -64.90% [N/A:N/A] Classification: [1:Full Thickness Without Exposed Support Structures] [N/A:N/A] HBO Classification: [1:Grade 1] [N/A:N/A] Exudate Amount: [1:Large] [N/A:N/A] Exudate Type: [1:Serosanguineous] [N/A:N/A] Exudate Color: [1:red, brown] [N/A:N/A] Wound Margin: [1:Flat and Intact] [N/A:N/A] Granulation Amount: [1:Large (67-100%)] [N/A:N/A] Granulation Quality: [1:Red, Pink] [N/A:N/A] Necrotic Amount: [1:Small (1-33%)] [N/A:N/A] Exposed Structures: [N/A:N/A] Fascia: No Fat: No Tendon: No Muscle: No Joint: No Bone: No Limited to Skin Breakdown Epithelialization: Small (1-33%) N/A N/A Periwound Skin Texture: Edema: No N/A N/A Excoriation: No Induration: No Callus: No Crepitus: No Fluctuance: No Friable: No Rash: No Scarring:  No Periwound Skin Maceration: Yes N/A N/A Moisture: Moist: Yes Dry/Scaly: No Periwound Skin Color: Atrophie Blanche: No N/A N/A Cyanosis: No Ecchymosis: No Erythema: No Hemosiderin Staining: No Mottled: No Pallor: No Rubor: No Temperature: No Abnormality N/A N/A Tenderness on Yes N/A N/A Palpation: Wound Preparation: Ulcer Cleansing: Other: N/A N/A soap and water Assessment Notes: Unable to get N/A N/A measurement dt to apiligraf application from last week. Dressing removed only down to meplilex and steristrips Treatment Notes Electronic Signature(s) Signed: 10/23/2014 4:33:06 PM By: [name] Entered By: [name] on 10/23/2014 16:05:22 Cavallero, [name] -------------------------------------------------------------------------------- Export Details Patient Name: [name], [name] Date of Service: 10/23/2014 4:00 PM Medical Record Number: [id number] Patient Account Number: [id number] Date of Birth/Sex: [birth year] (81 y.o. Female) Treating RN: [name] Primary Care Physician: [name] Other Clinician: Referring Physician: Ria [name] Treating Physician/Extender: [name] in Treatment: 9 Active Inactive Abuse / Safety / Falls / Self Care Management Nursing Diagnoses: Potential for falls Goals: Patient will remain injury free Date Initiated: 08/15/2014 Goal Status: Active Interventions: Assess fall risk on admission and as needed Notes: Nutrition Nursing Diagnoses: Impaired glucose control: actual or potential Goals: Patient/caregiver verbalizes understanding of need to maintain therapeutic glucose control per primary care physician Date Initiated: 08/15/2014 Goal Status: Active Interventions: Assess patient nutrition upon admission and as needed per policy Notes: Orientation to the Wound Care Program Nursing Diagnoses: Knowledge deficit related to the wound healing center  program Goals: Patient/caregiver will verbalize understanding of the [place], Mikenzie C. ([id number]) Date Initiated: 08/15/2014 Goal Status: Active Interventions: Provide education on orientation to the wound center Notes: Venous Leg Ulcer Nursing Diagnoses: Actual venous Insuffiency (use after diagnosis is confirmed) Goals: Patient will maintain optimal edema control Date Initiated: 08/15/2014 Goal Status: Active Interventions: Compression as ordered Treatment Activities: Test ordered outside of clinic : 10/23/2014 Notes: Wound/Skin Impairment Nursing Diagnoses: Impaired tissue integrity Goals: Patient/caregiver will verbalize understanding of skin care regimen Date Initiated: 08/15/2014 Goal Status: Active Interventions: Assess patient/caregiver ability to obtain necessary supplies Notes: Electronic Signature(s) Signed: 10/23/2014 4:33:06 PM By: [name] Entered By: [name] on 10/23/2014 16:05:08 Rosamilia, Chiquita C. ([id number]) -------------------------------------------------------------------------------- Pain Assessment Details Patient Name: [name], [name] Date of Service: 10/23/2014 4:00 PM Medical Record Number: [id number] Patient Account Number: [id number] Date of Birth/Sex: May 26, 1927 (81 y.o. Female) Treating RN: [name] Primary Care Physician: [name] Other Clinician: Referring Physician: Ria [name] Treating Physician/Extender: [name] in Treatment: 9 Active Problems Location of Pain Severity and Description of Pain Patient Has [name] Yes Site Locations Pain Location: Generalized Pain, Pain in Ulcers With Dressing Change: Yes Duration of the Pain.  Constant / Intermittento Intermittent Rate the pain. Current Pain Level: 5 Character of Pain Describe the Pain: Sharp, Throbbing Pain Management and Medication Current Pain Management: Electronic Signature(s) Signed: 10/23/2014 4:42:55  PM By: [name] Entered By: [name] Cool, RN, [name], [name] on 10/23/2014 15:51:30 [place], [name] ([id number]) -------------------------------------------------------------------------------- Patient/Caregiver Education Details Patient Name: [name], [name] Date of Service: 10/23/2014 4:00 PM Medical Record Number: [id number] Patient Account Number: [id number] Date of Birth/Gender: [birth year] (81 y.o. Female) Treating RN: [name] Primary Care Physician: [name] Other Clinician: Referring Physician: Ria [name] Treating Physician/Extender: [name] in Treatment: 9 Education Assessment Education Provided To: Patient Education Topics Provided Wound/Skin Impairment: Handouts: Other: compression and graphs Methods: Explain/Verbal Responses: State content correctly Electronic Signature(s) Signed: 10/23/2014 4:33:06 PM By: [name] Entered By: [name] on 10/23/2014 16:07:35 [name], [name] ([id number]) -------------------------------------------------------------------------------- Wound Assessment Details Patient Name: [name], [name] Date of Service: 10/23/2014 4:00 PM Medical Record Number: [id number] Patient Account Number: [id number] Date of Birth/Sex: 09-Mar-1928 (81 y.o. Female) Treating RN: [name] Primary Care Physician: [name] Other Clinician: Referring Physician: Ria [name] Treating Physician/Extender: [name] in Treatment: 9 Wound Status Wound Number: 1 Primary Venous Leg Ulcer Etiology: Wound Location: Right Lower Leg - Medial Wound Open Wounding Event: Gradually Appeared Status: Date Acquired: 04/04/2014 Comorbid Cataracts, Anemia, Coronary Artery Weeks Of Treatment: 9 History: Disease, Hypertension, Myocardial Clustered Wound: No Infarction, Type II Diabetes, Osteoarthritis Wound Measurements Length: (cm) 5.3 Width: (cm) 3.5 Depth: (cm) 0.2 Area: (cm)  14.569 Volume: (cm) 2.914 % Reduction in Area: -147.4% % Reduction in Volume: -64.9% Epithelialization: Small (1-33%) Wound Description Full Thickness Without Exposed Foul Odor A Classification: Support Structures Diabetic Severity Grade 1 (Wagner): Wound Margin: Flat and Intact Exudate Amount: Large Exudate Type: Serosanguineous Exudate Color: red, brown fter Cleansing: No Wound Bed Granulation Amount: Large (67-100%) Exposed Structure Granulation Quality: Red, Pink Fascia Exposed: No Necrotic Amount: Small (1-33%) Fat Layer Exposed: No Necrotic Quality: Adherent Slough Tendon Exposed: No Muscle Exposed: No Joint Exposed: No Bone Exposed: No Limited to Skin Breakdown Periwound Skin Texture Texture Color [name], [name] ([id number]) No Abnormalities Noted: No No Abnormalities Noted: No Callus: No Atrophie Blanche: No Crepitus: No Cyanosis: No Excoriation: No Ecchymosis: No Fluctuance: No Erythema: No Friable: No Hemosiderin Staining: No Induration: No Mottled: No Localized Edema: No Pallor: No Rash: No Rubor: No Scarring: No Temperature / Pain Moisture Temperature: No Abnormality No Abnormalities Noted: No Tenderness on Palpation: Yes Dry / Scaly: No Maceration: Yes Moist: Yes Wound Preparation Ulcer Cleansing: Other: soap and water, Assessment Notes Unable to get measurement dt to apiligraf application from last week. Dressing removed only down to meplilex and steristrips Treatment Notes Wound #1 (Right, Medial Lower Leg) 1. Cleansed with: Cleanse wound with antibacterial soap and water 7. Secured with 2 Layer Lite Compression System - Right Lower Extremity Notes Drawtex, ABD, Mepilex lite on heel and ankle Electronic Signature(s) Signed: 10/23/2014 4:42:55 PM By: [name] Entered By: [name] Cool, RN, [name], [name] on 10/23/2014 15:57:03 [name], [name]  ([id number]) -------------------------------------------------------------------------------- Vitals Details Patient Name: [name], [name] Date of Service: 10/23/2014 4:00 PM Medical Record Number: [id number] Patient Account Number: [id number] Date of Birth/Sex: [birth year] (81 y.o. Female) Treating RN: [name] Primary Care Physician: [name] Other Clinician: Referring Physician: Ria [name] Treating Physician/Extender: [name] in Treatment: 9 Vital Signs Time Taken: 15:51 Temperature (F): 98.1 Height (in): 58 Pulse (bpm): 78 Weight (lbs): 151 Respiratory  Rate (breaths/min): 18 Body Mass Index (BMI): 31.6 Blood Pressure (mmHg): 143/67 Reference Range: 80 - 120 mg / dl Electronic Signature(s) Signed: 10/23/2014 4:42:55 PM By: [name] Entered By: [name] Cool, RN, [name], [name] on 10/23/2014 15:51:57

## 2014-10-25 DIAGNOSIS — F42 Obsessive-compulsive disorder: Secondary | ICD-10-CM | POA: Diagnosis not present

## 2014-10-25 DIAGNOSIS — F411 Generalized anxiety disorder: Secondary | ICD-10-CM | POA: Diagnosis not present

## 2014-10-30 ENCOUNTER — Encounter: Payer: Self-pay | Admitting: General Surgery

## 2014-10-30 ENCOUNTER — Encounter (HOSPITAL_BASED_OUTPATIENT_CLINIC_OR_DEPARTMENT_OTHER): Payer: Medicare Other | Admitting: General Surgery

## 2014-10-30 DIAGNOSIS — I87331 Chronic venous hypertension (idiopathic) with ulcer and inflammation of right lower extremity: Secondary | ICD-10-CM | POA: Diagnosis not present

## 2014-10-30 DIAGNOSIS — E11622 Type 2 diabetes mellitus with other skin ulcer: Secondary | ICD-10-CM | POA: Diagnosis not present

## 2014-10-30 DIAGNOSIS — L97911 Non-pressure chronic ulcer of unspecified part of right lower leg limited to breakdown of skin: Secondary | ICD-10-CM | POA: Diagnosis not present

## 2014-10-30 DIAGNOSIS — L03115 Cellulitis of right lower limb: Secondary | ICD-10-CM | POA: Diagnosis not present

## 2014-10-30 DIAGNOSIS — E11610 Type 2 diabetes mellitus with diabetic neuropathic arthropathy: Secondary | ICD-10-CM | POA: Diagnosis not present

## 2014-10-30 DIAGNOSIS — L97919 Non-pressure chronic ulcer of unspecified part of right lower leg with unspecified severity: Secondary | ICD-10-CM

## 2014-10-30 DIAGNOSIS — IMO0002 Reserved for concepts with insufficient information to code with codable children: Secondary | ICD-10-CM

## 2014-10-30 DIAGNOSIS — I2510 Atherosclerotic heart disease of native coronary artery without angina pectoris: Secondary | ICD-10-CM | POA: Diagnosis not present

## 2014-10-30 DIAGNOSIS — I10 Essential (primary) hypertension: Secondary | ICD-10-CM | POA: Diagnosis not present

## 2014-10-30 NOTE — Progress Notes (Signed)
See i heal 

## 2014-10-31 ENCOUNTER — Ambulatory Visit (INDEPENDENT_AMBULATORY_CARE_PROVIDER_SITE_OTHER): Payer: Medicare Other | Admitting: Endocrinology

## 2014-10-31 ENCOUNTER — Encounter: Payer: Self-pay | Admitting: Endocrinology

## 2014-10-31 ENCOUNTER — Other Ambulatory Visit: Payer: Self-pay | Admitting: *Deleted

## 2014-10-31 VITALS — BP 130/62 | HR 73 | Temp 98.2°F | Resp 16 | Ht <= 58 in | Wt 150.2 lb

## 2014-10-31 DIAGNOSIS — I2510 Atherosclerotic heart disease of native coronary artery without angina pectoris: Secondary | ICD-10-CM

## 2014-10-31 DIAGNOSIS — E119 Type 2 diabetes mellitus without complications: Secondary | ICD-10-CM | POA: Diagnosis not present

## 2014-10-31 DIAGNOSIS — I10 Essential (primary) hypertension: Secondary | ICD-10-CM | POA: Diagnosis not present

## 2014-10-31 DIAGNOSIS — R5383 Other fatigue: Secondary | ICD-10-CM

## 2014-10-31 LAB — URINALYSIS, ROUTINE W REFLEX MICROSCOPIC
Bilirubin Urine: NEGATIVE
HGB URINE DIPSTICK: NEGATIVE
Ketones, ur: NEGATIVE
NITRITE: NEGATIVE
Specific Gravity, Urine: 1.02 (ref 1.000–1.030)
Total Protein, Urine: NEGATIVE
URINE GLUCOSE: NEGATIVE
Urobilinogen, UA: 0.2 (ref 0.0–1.0)
pH: 5.5 (ref 5.0–8.0)

## 2014-10-31 LAB — TSH: TSH: 1.82 u[IU]/mL (ref 0.35–4.50)

## 2014-10-31 LAB — BASIC METABOLIC PANEL
BUN: 24 mg/dL — ABNORMAL HIGH (ref 6–23)
CO2: 30 mEq/L (ref 19–32)
Calcium: 9.5 mg/dL (ref 8.4–10.5)
Chloride: 102 mEq/L (ref 96–112)
Creatinine, Ser: 0.86 mg/dL (ref 0.40–1.20)
GFR: 66.59 mL/min (ref >60.00)
Glucose, Bld: 90 mg/dL (ref 70–99)
POTASSIUM: 4.2 meq/L (ref 3.5–5.1)
Sodium: 139 mEq/L (ref 135–145)

## 2014-10-31 LAB — HEMOGLOBIN A1C: HEMOGLOBIN A1C: 6.7 % — AB (ref 4.6–6.5)

## 2014-10-31 MED ORDER — GLUCOSE BLOOD VI STRP: ORAL_STRIP | Status: DC

## 2014-10-31 NOTE — Progress Notes (Signed)
BANIA, WOOLUMS ([id number]) Visit Report for 10/30/2014 Arrival Information Details Patient Name: [name], [name] Date of Service: 10/30/2014 1:45 PM Medical Record Number: [id number] Patient Account Number: [id number] Date of Birth/Sex: 08/04/1927 (81 y.o. Female) Treating RN: Cornell Barman Primary Care Physician: Ria Bush Other Clinician: Referring Physician: Ria Bush Treating Physician/Extender: Benjaman Pott in Treatment: 10 Visit Information History Since Last Visit Added or deleted any medications: No Patient Arrived: Ambulatory Any new allergies or adverse reactions: No Arrival Time: 13:36 Had a fall or experienced change in No Accompanied By: son activities of daily living that may affect Transfer Assistance: None risk of falls: Patient Identification Verified: Yes Signs or symptoms of abuse/neglect since last No Secondary Verification Process Yes visito Completed: Hospitalized since last visit: No Patient Has Alerts: Yes Has Dressing in Place as Prescribed: Yes Patient Alerts: Patient on Blood Has Compression in Place as Prescribed: Yes Thinner Pain Present Now: No Type II Diabetic 81 MG aspirin Electronic Signature(s) Signed: 10/31/2014 8:42:46 AM By: Gretta Cool, RN, BSN, Kim RN, BSN Entered By: Gretta Cool, RN, BSN, Kim on 10/30/2014 13:38:48 Siegenthaler, [name] ([id number]) -------------------------------------------------------------------------------- Encounter Discharge Information Details Patient Name: [name], [name] Date of Service: 10/30/2014 1:45 PM Medical Record Number: [id number] Patient Account Number: [id number] Date of Birth/Sex: 1927-08-31 (81 y.o. Female) Treating RN: Cornell Barman Primary Care Physician: Ria Bush Other Clinician: Referring Physician: Ria Bush Treating Physician/Extender: Benjaman Pott in Treatment: 10 Encounter Discharge Information Items Discharge Pain Level: 0 Discharge Condition:  Stable Ambulatory Status: Walker Discharge Destination: Home Private Transportation: Auto Accompanied By: self Schedule Follow-up Appointment: Yes Medication Reconciliation completed and No provided to Patient/Care Kambrey Hagger: Clinical Summary of Care: Electronic Signature(s) Signed: 10/31/2014 8:42:46 AM By: Gretta Cool RN, BSN, Kim RN, BSN Entered By: Gretta Cool, RN, BSN, Kim on 10/30/2014 14:17:43 Delcastillo, [name] ([id number]) -------------------------------------------------------------------------------- Lower Extremity Assessment Details Patient Name: [name], [name] Date of Service: 10/30/2014 1:45 PM Medical Record Number: [id number] Patient Account Number: [id number] Date of Birth/Sex: 1928-01-18 (81 y.o. Female) Treating RN: Cornell Barman Primary Care Physician: Ria Bush Other Clinician: Referring Physician: Ria Bush Treating Physician/Extender: Benjaman Pott in Treatment: 10 Edema Assessment Assessed: [Left: No] [Right: No] E[Left: dema] [Right: :] Calf Left: Right: Point of Measurement: 27 cm From Medial Instep cm 27 cm Ankle Left: Right: Point of Measurement: 10 cm From Medial Instep cm 20.9 cm Vascular Assessment Pulses: Posterior Tibial Palpable: [Right:Yes] Dorsalis Pedis Palpable: [Right:Yes] Extremity colors, hair growth, and conditions: Extremity Color: [Right:Mottled] Hair Growth on Extremity: [Right:No] Temperature of Extremity: [Right:Warm] Capillary Refill: [Right:< 3 seconds] Toe Nail Assessment Left: Right: Thick: No Discolored: No Deformed: No Improper Length and Hygiene: No Electronic Signature(s) Signed: 10/31/2014 8:42:46 AM By: Gretta Cool, RN, BSN, Kim RN, BSN Entered By: Gretta Cool, RN, BSN, Kim on 10/30/2014 13:42:52 Sidman, [name] ([id number]) Kopp, Dorothie Loletha Grayer ([id number]) -------------------------------------------------------------------------------- Multi Wound Chart Details Patient Name: [name], [name] Date of  Service: 10/30/2014 1:45 PM Medical Record Number: [id number] Patient Account Number: [id number] Date of Birth/Sex: [birth year] (81 y.o. Female) Treating RN: Cornell Barman Primary Care Physician: Ria Bush Other Clinician: Referring Physician: Ria Bush Treating Physician/Extender: Benjaman Pott in Treatment: 10 Vital Signs Height(in): 58 Pulse(bpm): 84 Weight(lbs): 151 Blood Pressure 134/62 (mmHg): Body Mass Index(BMI): 32 Temperature(F): 97.7 Respiratory Rate 18 (breaths/min): Photos: [1:No Photos] [N/A:N/A] Wound Location: [1:Right Lower Leg - Medial] [N/A:N/A] Wounding Event: [1:Gradually Appeared] [N/A:N/A] Primary Etiology: [1:Venous Leg Ulcer] [N/A:N/A] Comorbid History: [1:Cataracts, Anemia, Coronary Artery Disease, Hypertension, Myocardial Infarction, Type II Diabetes, Osteoarthritis] [  N/A:N/A] Date Acquired: [1:04/04/2014] [N/A:N/A] Weeks of Treatment: [1:10] [N/A:N/A] Wound Status: [1:Open] [N/A:N/A] Measurements L x W x D 4.8x2x0.1 [N/A:N/A] (cm) Area (cm) : [1:7.54] [N/A:N/A] Volume (cm) : [1:0.754] [N/A:N/A] % Reduction in Area: [1:-28.00%] [N/A:N/A] % Reduction in Volume: 57.30% [N/A:N/A] Classification: [1:Full Thickness Without Exposed Support Structures] [N/A:N/A] HBO Classification: [1:Grade 1] [N/A:N/A] Exudate Amount: [1:Large] [N/A:N/A] Exudate Type: [1:Serosanguineous] [N/A:N/A] Exudate Color: [1:red, brown] [N/A:N/A] Wound Margin: [1:Flat and Intact] [N/A:N/A] Granulation Amount: [1:Large (67-100%)] [N/A:N/A] Granulation Quality: [1:Red, Pink, Hyper- granulation] [N/A:N/A] Necrotic Amount: [1:Small (1-33%)] [N/A:N/A] Exposed Structures: Fascia: No N/A N/A Fat: No Tendon: No Muscle: No Joint: No Bone: No Limited to Skin Breakdown Epithelialization: Small (1-33%) N/A N/A Periwound Skin Texture: Edema: No N/A N/A Excoriation: No Induration: No Callus: No Crepitus: No Fluctuance: No Friable: No Rash: No Scarring:  No Periwound Skin Maceration: Yes N/A N/A Moisture: Moist: Yes Dry/Scaly: No Periwound Skin Color: Atrophie Blanche: No N/A N/A Cyanosis: No Ecchymosis: No Erythema: No Hemosiderin Staining: No Mottled: No Pallor: No Rubor: No Temperature: No Abnormality N/A N/A Tenderness on Yes N/A N/A Palpation: Wound Preparation: Ulcer Cleansing: Other: N/A N/A soap and water Topical Anesthetic Applied: Other: lidocaine4% Treatment Notes Electronic Signature(s) Signed: 10/31/2014 8:42:46 AM By: Gretta Cool, RN, BSN, Kim RN, BSN Entered By: Gretta Cool, RN, BSN, Kim on 10/30/2014 13:46:07 [name], [name] -------------------------------------------------------------------------------- [place] Details Patient Name: [name], [name] Date of Service: 10/30/2014 1:45 PM Medical Record Number: [id number] Patient Account Number: [id number] Date of Birth/Sex: 04-11-1927 (81 y.o. Female) Treating RN: Cornell Barman Primary Care Physician: Ria Bush Other Clinician: Referring Physician: Ria Bush Treating Physician/Extender: Benjaman Pott in Treatment: 10 Active Inactive Abuse / Safety / Falls / Self Care Management Nursing Diagnoses: Potential for falls Goals: Patient will remain injury free Date Initiated: 08/15/2014 Goal Status: Active Interventions: Assess fall risk on admission and as needed Notes: Nutrition Nursing Diagnoses: Impaired glucose control: actual or potential Goals: Patient/caregiver verbalizes understanding of need to maintain therapeutic glucose control per primary care physician Date Initiated: 08/15/2014 Goal Status: Active Interventions: Assess patient nutrition upon admission and as needed per policy Notes: Orientation to the Wound Care Program Nursing Diagnoses: Knowledge deficit related to the wound healing center program Goals: Patient/caregiver will verbalize understanding of the [place], Imajean C. ([id number]) Date Initiated: 08/15/2014 Goal Status: Active Interventions: Provide education on orientation to the wound center Notes: Venous Leg Ulcer Nursing Diagnoses: Actual venous Insuffiency (use after diagnosis is confirmed) Goals: Patient will maintain optimal edema control Date Initiated: 08/15/2014 Goal Status: Active Interventions: Compression as ordered Treatment Activities: Test ordered outside of clinic : 10/30/2014 Notes: Wound/Skin Impairment Nursing Diagnoses: Impaired tissue integrity Goals: Patient/caregiver will verbalize understanding of skin care regimen Date Initiated: 08/15/2014 Goal Status: Active Interventions: Assess patient/caregiver ability to obtain necessary supplies Notes: Electronic Signature(s) Signed: 10/31/2014 8:42:46 AM By: Gretta Cool, RN, BSN, Kim RN, BSN Entered By: Gretta Cool, RN, BSN, Kim on 10/30/2014 13:45:58 [name], [name] -------------------------------------------------------------------------------- Patient/Caregiver Education Details Patient Name: [name], [name] Date of Service: 10/30/2014 1:45 PM Medical Record Number: [id number] Patient Account Number: [id number] Date of Birth/Gender: 08/16/1927 (81 y.o. Female) Treating RN: Cornell Barman Primary Care Physician: Ria Bush Other Clinician: Referring Physician: Ria Bush Treating Physician/Extender: Benjaman Pott in Treatment: 10 Education Assessment Education Provided To: Patient Education Topics Provided Venous: Handouts: Other: compression and wound healing Methods: Demonstration, Explain/Verbal Responses: State content correctly Electronic Signature(s) Signed: 10/31/2014 8:42:46 AM By: Gretta Cool, RN, BSN, Kim RN, BSN  Entered By: Gretta Cool, RN, BSN, Kim on 10/30/2014 14:00:50 [name], [name] -------------------------------------------------------------------------------- Wound Assessment Details Patient  Name: [name], [name] Date of Service: 10/30/2014 1:45 PM Medical Record Number: [id number] Patient Account Number: [id number] Date of Birth/Sex: 09/26/1927 (81 y.o. Female) Treating RN: Cornell Barman Primary Care Physician: Ria Bush Other Clinician: Referring Physician: Ria Bush Treating Physician/Extender: Benjaman Pott in Treatment: 10 Wound Status Wound Number: 1 Primary Venous Leg Ulcer Etiology: Wound Location: Right Lower Leg - Medial Wound Open Wounding Event: Gradually Appeared Status: Date Acquired: 04/04/2014 Comorbid Cataracts, Anemia, Coronary Artery Weeks Of Treatment: 10 History: Disease, Hypertension, Myocardial Clustered Wound: No Infarction, Type II Diabetes, Osteoarthritis Wound Measurements Length: (cm) 4.8 Width: (cm) 2 Depth: (cm) 0.1 Area: (cm) 7.54 Volume: (cm) 0.754 % Reduction in Area: -28% % Reduction in Volume: 57.3% Epithelialization: Small (1-33%) Tunneling: No Undermining: No Wound Description Full Thickness Without Exposed Foul Odor Classification: Support Structures Diabetic Severity Grade 1 (Wagner): Wound Margin: Flat and Intact Exudate Amount: Large Exudate Type: Serosanguineous Exudate Color: red, brown After Cleansing: No Wound Bed Granulation Amount: Large (67-100%) Exposed Structure Granulation Quality: Red, Pink, Hyper-granulation Fascia Exposed: No Necrotic Amount: Small (1-33%) Fat Layer Exposed: No Necrotic Quality: Adherent Slough Tendon Exposed: No Muscle Exposed: No Joint Exposed: No Bone Exposed: No Limited to Skin Breakdown Periwound Skin Texture Texture Color [name], [name] No Abnormalities Noted: No No Abnormalities Noted: No Callus: No Atrophie Blanche: No Crepitus: No Cyanosis: No Excoriation: No Ecchymosis: No Fluctuance: No Erythema: No Friable: No Hemosiderin Staining: No Induration: No Mottled: No Localized Edema: No Pallor: No Rash: No Rubor:  No Scarring: No Temperature / Pain Moisture Temperature: No Abnormality No Abnormalities Noted: No Tenderness on Palpation: Yes Dry / Scaly: No Maceration: Yes Moist: Yes Wound Preparation Ulcer Cleansing: Other: soap and water, Topical Anesthetic Applied: Other: lidocaine4%, Treatment Notes Wound #1 (Right, Medial Lower Leg) 1. Cleansed with: Cleanse wound with antibacterial soap and water 2. Anesthetic Topical Lidocaine 4% cream to wound bed prior to debridement 4. Dressing Applied: Aquacel Ag Other dressing (specify in notes) 7. Secured with 2 Layer Lite Compression System - Right Lower Extremity Notes xtrasorb, Mepilex lite on heel and ankle Electronic Signature(s) Signed: 10/31/2014 8:42:46 AM By: Gretta Cool, RN, BSN, Kim RN, BSN Entered By: Gretta Cool, RN, BSN, Kim on 10/30/2014 13:45:46 [name], [name] -------------------------------------------------------------------------------- Vitals Details Patient Name: Denardo, Lashelle C. Date of Service: 10/30/2014 1:45 PM Medical Record Number: [id number] Patient Account Number: [id number] Date of Birth/Sex: June 05, 1927 (81 y.o. Female) Treating RN: Cornell Barman Primary Care Physician: Ria Bush Other Clinician: Referring Physician: Ria Bush Treating Physician/Extender: Benjaman Pott in Treatment: 10 Vital Signs Time Taken: 13:39 Temperature (F): 97.7 Height (in): 58 Pulse (bpm): 84 Weight (lbs): 151 Respiratory Rate (breaths/min): 18 Body Mass Index (BMI): 31.6 Blood Pressure (mmHg): 134/62 Reference Range: 80 - 120 mg / dl Electronic Signature(s) Signed: 10/31/2014 8:42:46 AM By: Gretta Cool, RN, BSN, Kim RN, BSN Entered By: Gretta Cool, RN, BSN, Kim on 10/30/2014 13:39:46

## 2014-10-31 NOTE — Progress Notes (Signed)
[name], female   DOB: 22-Jan-1928, 81 y.o.   MRN: [id number]   Reason for Appointment: Diabetes follow-up   History of Present Illness   Diagnosis: Type 2 DIABETES MELITUS, date of diagnosis: 1991   [name] has had mild diabetes for several years and has been on Actos only since about 2001 Her A1c has generally been under 7% [name] was given a trial of metformin because of her difficulty with weight loss and also edema but [name] felt that it made her blood sugars go up and will not try it again Also [name] thinks her blood sugars were higher with a trial of Januvia  Still not having any side effects from Actos and may have had less edema with reducing dose to 30 mg since 01/2014 Again [name] is is only checking her sugar in the afternoon randomly and did not bring any record or her monitor today A1c has generally been near-normal and will need to repeat today [name] has difficulty with exercise because of leg pain Her weight is stable     Oral hypoglycemic drugs: Actos 30 mg daily        Side effects from medications: None Monitors blood glucose: ?  Frequency .    Glucometer: One Touch.          Blood Glucose readings from recall: 118-268 but usually not high  Hypoglycemia frequency: Never.           Meals: 2  meals per day.  usually compliant with diet         Physical activity:  minimal           Dietician visit: Most recent: Several years ago             Wt Readings from Last 3 Encounters:  10/31/14 150 lb 3.2 oz (68.13 kg)  09/30/14 148 lb (67.132 kg)  09/23/14 148 lb 3.2 oz (67.223 kg)   Lab Results  Component Value Date   HGBA1C 7.0* 06/09/2014   HGBA1C 6.6* 03/10/2014   HGBA1C 6.6* 11/18/2013   Lab Results  Component Value Date   MICROALBUR 0.2 03/10/2014   LDLCALC 60 09/23/2014   CREATININE 0.87 09/02/2014       Medication List       This list is accurate as of: 10/31/14  3:20 PM.  Always use your most recent med list.               ALPRAZolam 1 MG tablet  Commonly known as:  XANAX  Take 0.5 mg by mouth 4 (four) times daily. For anxiety     aspirin EC 81 MG tablet  Take 81 mg by mouth daily.     CALCIUM PO  Take 600 mg by mouth daily.     DEXILANT 30 MG capsule  Generic drug:  Dexlansoprazole  TAKE 1 CAPSULE BY MOUTH DAILY     doxycycline 100 MG capsule  Commonly known as:  VIBRAMYCIN  Take 100 mg by mouth 2 (two) times daily.     ferrous sulfate 325 (65 FE) MG tablet  Take 1 tablet (325 mg total) by mouth daily with breakfast.     furosemide 20 MG tablet  Commonly known as:  LASIX  TAKE 1 TABLET (20 MG TOTAL) BY MOUTH DAILY. FOR LEG SWELLING     glucosamine-chondroitin 500-400 MG tablet  Take 1 tablet by mouth daily.     HYDROcodone-acetaminophen 5-325 MG per tablet  Commonly known as:  NORCO/VICODIN  Take 0.5-1 tablets by mouth every 6 (six) hours as needed for moderate pain.     loperamide 2 MG tablet  Commonly known as:  IMODIUM A-D  Take 2 mg by mouth 4 (four) times daily as needed for diarrhea or loose stools.     nitroGLYCERIN 0.4 MG SL tablet  Commonly known as:  NITROSTAT  Place 0.4 mg under the tongue every 5 (five) minutes as needed. For chest pain May take up to 3 doses, if pain persists call 911.     nystatin-triamcinolone ointment  Commonly known as:  MYCOLOG  Apply 1 application topically 2 (two) times daily. As needed for itching     pioglitazone 30 MG tablet  Commonly known as:  ACTOS  Take 30 mg by mouth daily. For diabetes     pravastatin 20 MG tablet  Commonly known as:  PRAVACHOL  TAKE 1 TABLET BY MOUTH DAILY.     ramipril 5 MG capsule  Commonly known as:  ALTACE  TAKE 1 CAPSULE BY MOUTH EVERY DAY for blood pressure     rOPINIRole 1 MG tablet  Commonly known as:  REQUIP  Take 1 mg by mouth 3 (three) times daily.     traZODone 150 MG tablet  Commonly known as:  DESYREL  Take 150 mg by mouth at bedtime. For sleep     triamcinolone cream 0.1 %  Commonly known as:   KENALOG  Apply 1 application topically daily.     VITAMIN B 12 PO  Take 1 tablet by mouth daily.     VITAMIN C PO  Take 1,000 mg by mouth daily.     VITAMIN E PO  Take 1 tablet by mouth daily.        Allergies:  Allergies  Allergen Reactions  . Amoxicillin-Pot Clavulanate Diarrhea  . Metformin And Related Diarrhea    Past Medical History  Diagnosis Date  . Hypertension   . Coronary artery disease     CABG 1998  . Anxiety   . Diastolic dysfunction   . Hyperlipidemia   . PUD (peptic ulcer disease)   . Microcytic anemia   . Depression   . Diabetes mellitus     type 2, followed by endo.  . Diverticulosis   . Hemorrhoids   . History of heart attack   . Irritable bowel syndrome   . Chronic back pain 03/2012    lumbar DDD with herniation - s/p L S1 transforaminal ESI (10/2012 Dr. Sharlet Salina)  . Varicose veins   . Cystocele   . Hx of echocardiogram     a. Echo 10/13: mild LVH, EF 55-60%, Gr 1 diast dysfn, PASP 32  . Acute deep vein thrombosis (DVT) of distal vein of right lower extremity 12/2013    Acute thrombus of R proximal gastrocnemius vein. Symptomatic provoked distal DVT  . Cellulitis of leg, left 01/16/2014  . PAD (peripheral artery disease) 2013    ABI: R 0.91, L 0.83  . Chronic venous insufficiency     Past Surgical History  Procedure Laterality Date  . Tympanoplasty  1960s    R side  . Coronary artery bypass graft  1998  . Cataract surgery    . Cholecystectomy    . Appendectomy      per pt report  . Colonoscopy  02/2010    extensive diverticulosis throughout, small internal hemorrhoids, rec rpt 5 yrs (Dr. Allyn Kenner)  . Hand surgery    . Leane Call  03/2011  Small basal inferolateral and anterolateral reversible perfusion defect suggests ischemia.  EF was normal.   old infarct, no new ischemia  . Toe surgery    . Abdominal hysterectomy  1975    TAH,BSO  . Vault prolapse a & p repair  2009    Dr [facility]  . Dexa scan  09/2011     femur -0.8, forearm 0.6 => normal  . Esi  2014    L S1 transforaminal ESI x2 (Chasnis) without improvement  . Abi  2013    R 0.91, L 0.83  . Endovenous ablation saphenous vein w/ laser Left 03/2014    Kellie Simmering    Family History  Problem Relation Age of Onset  . Hypertension Mother   . Stroke Mother   . Diabetes Father   . Diabetes Sister   . Diabetes Brother   . Colon cancer Neg Hx     Social History:  reports that [name] quit smoking about 35 years ago. [name] has never used smokeless tobacco. [name] reports that [name] does not drink alcohol or use illicit drugs.  Review of Systems:  [name] is complaining of fatigue: No recent TSH level available  Leg ulcer on the right lower leg: [name] is still on treatment with wound center, has not had complete healing Arterial Doppler studies were recommended but have not been done  [name] has some pain in her lower legs. [name] takes hydrocodone with some relief.   HYPERTENSION:  [name] is having good control usually with ramipril  Also takes Lasix for edema  No micro-albuminuria and has normal renal function and potassium with this.  HYPERLIPIDEMIA: The lipid abnormality consists of elevated LDL treated with pravastatin low-dose, followed by PCP.  Lab Results  Component Value Date   CHOL 120 09/23/2014   HDL 44.00 09/23/2014   LDLCALC 60 09/23/2014   TRIG 82.0 09/23/2014   CHOLHDL 3 09/23/2014     Last foot exam in 06/2014     Examination:   BP 130/62 mmHg  Pulse 73  Temp(Src) 98.2 F (36.8 C)  Resp 16  Ht 4\' 10"  (1.473 m)  Wt 150 lb 3.2 oz (68.13 kg)  BMI 31.40 kg/m2  SpO2 95%  Body mass index is 31.4 kg/(m^2).   1+ right lower leg edema present  ASSESSMENT/ PLAN:   The patient's diabetes control is appearing fairly good with home blood sugars usually normal Has been on Actos for years with stable control A1c to be checked today [name] is not very ambulatory because of her leg pains but is able to keep her weight fairly stable [name] was  planed the need to check blood sugars at various times of the day and [name] will call if they are consistently high [name] will bring her monitor for review on the next visit which [name] has not done despite repeated reminders     HYPERTENSION: This is mild and well controlled without orthostatic symptoms.    No history of proteinuria    Boyce Keltner 10/31/2014, 3:20 PM      Addendum: A1c 6.7, TSH normal  Office Visit on 10/31/2014  Component Date Value Ref Range Status  . Hgb A1c MFr Bld 10/31/2014 6.7* 4.6 - 6.5 % Final   Glycemic Control Guidelines for People with Diabetes:Non Diabetic:  <6%Goal of Therapy: <7%Additional Action Suggested:  >8%   . Sodium 10/31/2014 139  135 - 145 mEq/L Final  . Potassium 10/31/2014 4.2  3.5 - 5.1 mEq/L Final  . Chloride 10/31/2014 102  96 - 112 mEq/L Final  . CO2 10/31/2014 30  19 - 32 mEq/L Final  . Glucose, Bld 10/31/2014 90  70 - 99 mg/dL Final  . BUN 10/31/2014 24* 6 - 23 mg/dL Final  . Creatinine, Ser 10/31/2014 0.86  0.40 - 1.20 mg/dL Final  . Calcium 10/31/2014 9.5  8.4 - 10.5 mg/dL Final  . GFR 10/31/2014 66.59  >60.00 mL/min Final  . TSH 10/31/2014 1.82  0.35 - 4.50 uIU/mL Final  . Color, Urine 10/31/2014 YELLOW  Yellow;Lt. Yellow Final  . APPearance 10/31/2014 CLEAR  Clear Final  . Specific Gravity, Urine 10/31/2014 1.020  1.000-1.030 Final  . pH 10/31/2014 5.5  5.0 - 8.0 Final  . Total Protein, Urine 10/31/2014 NEGATIVE  Negative Final  . Urine Glucose 10/31/2014 NEGATIVE  Negative Final  . Ketones, ur 10/31/2014 NEGATIVE  Negative Final  . Bilirubin Urine 10/31/2014 NEGATIVE  Negative Final  . Hgb urine dipstick 10/31/2014 NEGATIVE  Negative Final  . Urobilinogen, UA 10/31/2014 0.2  0.0 - 1.0 Final  . Leukocytes, UA 10/31/2014 SMALL* Negative Final  . Nitrite 10/31/2014 NEGATIVE  Negative Final  . WBC, UA 10/31/2014 3-6/hpf* 0-2/hpf Final  . RBC / HPF 10/31/2014 0-2/hpf  0-2/hpf Final  . Squamous Epithelial / LPF 10/31/2014  Few(5-10/hpf)* Rare(0-4/hpf) Final  . Bacteria, UA 10/31/2014 Rare(<10/hpf)* None Final

## 2014-10-31 NOTE — Progress Notes (Addendum)
ZONNA, BATTLES ([id number]) Visit Report for 10/30/2014 Chief Complaint Document Details Patient Name: [name], [name] Date of Service: 10/30/2014 1:45 PM Medical Record Number: [id number] Patient Account Number: [id number] Date of Birth/Sex: 05-Mar-1928 (81 y.o. Female) Treating RN: Primary Care Physician: Ria Bush Other Clinician: Referring Physician: Ria Bush Treating Physician/Extender: Benjaman Pott in Treatment: 10 Information Obtained from: Patient Chief Complaint Patient presents for treatment of an open ulcer due to venous insufficiency. 81 year old patient who is known to me from the [facility] wound care center now comes in follow up here for a chronic venous ulceration of her right lower extremity which she's had for about 6 months. Electronic Signature(s) Signed: 10/30/2014 2:23:15 PM By: Judene Companion MD Entered By: Judene Companion on 10/30/2014 14:23:15 [name], [name] ([id number]) -------------------------------------------------------------------------------- Debridement Details Patient Name: Nong, Shaquoya C. Date of Service: 10/30/2014 1:45 PM Medical Record Number: [id number] Patient Account Number: [id number] Date of Birth/Sex: 1927-07-14 (81 y.o. Female) Treating RN: Cornell Barman Primary Care Physician: Ria Bush Other Clinician: Referring Physician: Ria Bush Treating Physician/Extender: Benjaman Pott in Treatment: 10 Debridement Performed for Wound #1 Right,Medial Lower Leg Assessment: Performed By: Physician Judene Companion, MD Debridement: Debridement Pre-procedure Yes Verification/Time Out Taken: Start Time: 13:53 Pain Control: Lidocaine 4% Topical Solution Level: Skin/Subcutaneous Tissue Total Area Debrided (L x 4.8 (cm) x 2 (cm) = 9.6 (cm) W): Tissue and other Viable, Non-Viable, Fibrin/Slough, Subcutaneous material debrided: Instrument: Curette Bleeding: Minimum Hemostasis Achieved: Pressure End  Time: 13:54 Procedural Pain: 0 Post Procedural Pain: 7 Response to Treatment: Procedure was tolerated well Post Debridement Measurements of Total Wound Length: (cm) 4.8 Width: (cm) 2 Depth: (cm) 0.1 Volume: (cm) 0.754 Electronic Signature(s) Signed: 10/31/2014 8:42:46 AM By: Gretta Cool, RN, BSN, Kim RN, BSN Entered By: Gretta Cool, RN, BSN, Kim on 10/30/2014 13:55:24 [name], [name] ([id number]) -------------------------------------------------------------------------------- HPI Details Patient Name: Brant, Deborahann C. Date of Service: 10/30/2014 1:45 PM Medical Record Number: [id number] Patient Account Number: [id number] Date of Birth/Sex: 07-Mar-1928 (81 y.o. Female) Treating RN: Primary Care Physician: Ria Bush Other Clinician: Referring Physician: Ria Bush Treating Physician/Extender: Benjaman Pott in Treatment: 10 History of Present Illness Location: ulcer in the right lower extremity for several months Quality: Patient reports experiencing a dull pain to affected area(s). Severity: Patient states wound (s) are getting better. Duration: Patient has had the wound for > 3 months prior to seeking treatment at the wound center Timing: Pain in wound is Intermittent (comes and goes Context: The wound appeared gradually over time Modifying Factors: Consults to this date include: vascular surgery for the veins in December 2015 and she's been seeing the wound care center at [facility] for several months. Associated Signs and Symptoms: Patient reports having difficulty standing for long periods. HPI Description: This is an 81 yrs old female we have been following since the beginning of January for a wound on the right medial ankle. She had previously had venous ablation of the greater saphenous vein by Dr. Kellie Simmering. She came to [place] with erythema of the right leg and subsequently is opened in the medial aspect of the right ankle. She has not been very compliant with  compression. We ordered her at juxta lite stocking, however she apparently has not been able to wear it. The area on the medial aspect of her left ankle has much less induration and erythema. The edema is better controlled. No evidence of infection is noted. 07/18/14; no major change in the condition of this wound. There is still surface slough and probably  some drainage. 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 All other veins showed no evidence of DVT, superficial venous thrombosis or incompetence. however she does have manifestations of chronic venous hypertension and I believe we will change her dressing over to Prisma and a Profore light dressing. she sees her primary care doctor regularly and her last hemoglobin A1c has been around 7%.her other comorbidities are hypertension, CABG in 1998, peptic ulcer disease, history of echo with a ejection fraction of 55-60% and grade 1 diastolic dysfunction. past surgical history include coronary artery bypass graft in 1998, cholecystectomy, appendectomy, hand surgery, endovenous ablation of the saphenous vein with laser by Dr. Kellie Simmering in December 2015. 08/29/2014 - she complains of increased calf pain over the past week. removed her compression bandage and pain improved. no fever or chills. Mild clear drainage. 09/04/2014 -- she has not been compliant and has not done a dressing change as we had ordered last week. She always complains and does not keep her wrap on and this past week she has done the same. She has completed a course of doxycycline. 09/25/2014 -- she has tolerated her dressing all week and has not removed it. She says it's hurting a lot today and would not like any debridement. We will do so as per her wishes. she did get a appointment with the vascular surgeon and sees him on 6/28. SKYLEEN, WATES  ([id number]) 10/03/2014 - She was seen by Dr. Tinnie Gens 09/30/2014. Assessment: I reviewed the previous venous duplex exam study performed in September 2015 which reveals no options for treating this venous ulcer. There is no significant superficial venous reflux to treat with laser ablation.Most recent ABI was 0.91 in the right foot with palpable dorsalis pedis pulse at 3+ Plan: patient needs to return to the [place] wound center and be treated by Dr. Con Memos. No arterial or venous options available from vascular standpoint or treatment of right leg stasis ulcer.Continued dressing changes and compression is the treatment of choice 10/16/2014 -- today will be her first application of Apligraf. 10/23/2014 -- we are going to be able to apply her second day of Apligraf next week. She is complaining of some tenderness in the region of Achilles tendon and this is possibly due to her wrap. Electronic Signature(s) Signed: 10/30/2014 2:23:24 PM By: Judene Companion MD Entered By: Judene Companion on 10/30/2014 14:23:23 [name], [name] -------------------------------------------------------------------------------- Physical Exam Details Patient Name: Mendenhall, Margret C. Date of Service: 10/30/2014 1:45 PM Medical Record Number: [id number] Patient Account Number: [id number] Date of Birth/Sex: Jan 03, 1928 (81 y.o. Female) Treating RN: Primary Care Physician: Ria Bush Other Clinician: Referring Physician: Ria Bush Treating Physician/Extender: Benjaman Pott in Treatment: 10 Electronic Signature(s) Signed: 10/30/2014 2:23:32 PM By: Judene Companion MD Entered By: Judene Companion on 10/30/2014 14:23:32 [name], [name] -------------------------------------------------------------------------------- Physician Orders Details Patient Name: Krasowski, Courtnee C. Date of Service: 10/30/2014 1:45 PM Medical Record Number: [id number] Patient Account Number:  [id number] Date of Birth/Sex: 04-03-1928 (81 y.o. Female) Treating RN: Cornell Barman Primary Care Physician: Ria Bush Other Clinician: Referring Physician: Ria Bush Treating Physician/Extender: Benjaman Pott in Treatment: 10 Verbal / Phone Orders: Yes Clinician: Cornell Barman Read Back and Verified: Yes Diagnosis Coding Wound Cleansing Wound #1 Right,Medial Lower Leg o Cleanse wound with mild soap and water Anesthetic Wound #1 Right,Medial Lower Leg o Topical Lidocaine 4% cream applied  to wound bed prior to debridement Primary Wound Dressing Wound #1 Right,Medial Lower Leg o Aquacel Ag Secondary Dressing Wound #1 Right,Medial Lower Leg o XtraSorb Dressing Change Frequency Wound #1 Right,Medial Lower Leg o Change dressing every week Follow-up Appointments Wound #1 Right,Medial Lower Leg o Return Appointment in 1 week. Edema Control Wound #1 Right,Medial Lower Leg o 2 Layer Lite Compression System - Right Lower Extremity Additional Orders / Instructions Wound #1 Right,Medial Lower Leg o Increase protein intake. Electronic Signature(s) CHALENA, POPPY ([id number]) Signed: 10/31/2014 8:42:46 AM By: Gretta Cool, RN, BSN, Kim RN, BSN Entered By: Gretta Cool, RN, BSN, Kim on 10/30/2014 13:56:44 Song, [name] ([id number]) -------------------------------------------------------------------------------- Problem List Details Patient Name: [name], [name] Date of Service: 10/30/2014 1:45 PM Medical Record Number: [id number] Patient Account Number: [id number] Date of Birth/Sex: 01-Sep-1927 (81 y.o. Female) Treating RN: Primary Care Physician: Ria Bush Other Clinician: Referring Physician: Ria Bush Treating Physician/Extender: Benjaman Pott in Treatment: 10 Active Problems ICD-10 Encounter Code Description Active Date Diagnosis E11.622 Type 2 diabetes mellitus with other skin ulcer 08/15/2014 Yes E11.610 Type 2 diabetes  mellitus with diabetic neuropathic 08/15/2014 Yes arthropathy I87.331 Chronic venous hypertension (idiopathic) with ulcer and 08/15/2014 Yes inflammation of right lower extremity L03.115 Cellulitis of right lower limb 08/29/2014 Yes Inactive Problems Resolved Problems Electronic Signature(s) Signed: 10/30/2014 2:22:58 PM By: Judene Companion MD Previous Signature: 10/30/2014 2:22:19 PM Version By: Judene Companion MD Entered By: Judene Companion on 10/30/2014 14:22:58 [name], [name] ([id number]) -------------------------------------------------------------------------------- Progress Note Details Patient Name: [name], [name] Date of Service: 10/30/2014 1:45 PM Medical Record Number: [id number] Patient Account Number: [id number] Date of Birth/Sex: 1928/02/11 (81 y.o. Female) Treating RN: Primary Care Physician: Ria Bush Other Clinician: Referring Physician: Ria Bush Treating Physician/Extender: Benjaman Pott in Treatment: 10 Subjective Chief Complaint Information obtained from Patient Patient presents for treatment of an open ulcer due to venous insufficiency. 81 year old patient who is known to me from the [facility] wound care center now comes in follow up here for a chronic venous ulceration of her right lower extremity which she's had for about 6 months. History of Present Illness (HPI) The following HPI elements were documented for the patient's wound: Location: ulcer in the right lower extremity for several months Quality: Patient reports experiencing a dull pain to affected area(s). Severity: Patient states wound (s) are getting better. Duration: Patient has had the wound for > 3 months prior to seeking treatment at the wound center Timing: Pain in wound is Intermittent (comes and goes Context: The wound appeared gradually over time Modifying Factors: Consults to this date include: vascular surgery for the veins in December 2015 and she's been seeing the  wound care center at [facility] for several months. Associated Signs and Symptoms: Patient reports having difficulty standing for long periods. This is an 81 yrs old female we have been following since the beginning of January for a wound on the right medial ankle. She had previously had venous ablation of the greater saphenous vein by Dr. Kellie Simmering. She came to [place] with erythema of the right leg and subsequently is opened in the medial aspect of the right ankle. She has not been very compliant with compression. We ordered her at juxta lite stocking, however she apparently has not been able to wear it. The area on the medial aspect of her left ankle has much less induration and erythema. The edema is better controlled. No evidence of infection is noted. 07/18/14; no major change in the condition of this wound. There  is still surface slough and probably some drainage. 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 All other veins showed no evidence of DVT, superficial venous thrombosis or incompetence. however she does have manifestations of chronic venous hypertension and I believe we will change her dressing over to Prisma and a Profore light dressing. she sees her primary care doctor regularly and her last hemoglobin A1c has been around 7%.her other comorbidities are hypertension, CABG in 1998, peptic ulcer disease, history of echo with a ejection fraction of 55-60% and grade 1 diastolic dysfunction. past surgical history include coronary artery bypass graft in 1998, cholecystectomy, appendectomy, hand surgery, endovenous ablation of the saphenous vein with laser by Dr. Kellie Simmering in December 2015. [name], [name] ([id number]) 08/29/2014 - she complains of increased calf pain over the past week. removed her compression bandage and pain improved. no fever or chills. Mild clear  drainage. 09/04/2014 -- she has not been compliant and has not done a dressing change as we had ordered last week. She always complains and does not keep her wrap on and this past week she has done the same. She has completed a course of doxycycline. 09/25/2014 -- she has tolerated her dressing all week and has not removed it. She says it's hurting a lot today and would not like any debridement. We will do so as per her wishes. she did get a appointment with the vascular surgeon and sees him on 6/28. 10/03/2014 - She was seen by Dr. Tinnie Gens 09/30/2014. Assessment: I reviewed the previous venous duplex exam study performed in September 2015 which reveals no options for treating this venous ulcer. There is no significant superficial venous reflux to treat with laser ablation.Most recent ABI was 0.91 in the right foot with palpable dorsalis pedis pulse at 3+ Plan: patient needs to return to the [place] wound center and be treated by Dr. Con Memos. No arterial or venous options available from vascular standpoint or treatment of right leg stasis ulcer.Continued dressing changes and compression is the treatment of choice 10/16/2014 -- today will be her first application of Apligraf. 10/23/2014 -- we are going to be able to apply her second day of Apligraf next week. She is complaining of some tenderness in the region of Achilles tendon and this is possibly due to her wrap. Objective Constitutional Vitals Time Taken: 1:39 PM, Height: 58 in, Weight: 151 lbs, BMI: 31.6, Temperature: 97.7 F, Pulse: 84 bpm, Respiratory Rate: 18 breaths/min, Blood Pressure: 134/62 mmHg. Integumentary (Hair, Skin) Wound #1 status is Open. Original cause of wound was Gradually Appeared. The wound is located on the Right,Medial Lower Leg. The wound measures 4.8cm length x 2cm width x 0.1cm depth; 7.54cm^2 area and 0.754cm^3 volume. The wound is limited to skin breakdown. There is no tunneling or undermining noted.  There is a large amount of serosanguineous drainage noted. The wound margin is flat and intact. There is large (67-100%) red, pink granulation within the wound bed. There is a small (1-33%) amount of necrotic tissue within the wound bed including Adherent Slough. The periwound skin appearance exhibited: Maceration, Moist. The periwound skin appearance did not exhibit: Callus, Crepitus, Excoriation, Fluctuance, Friable, Induration, Localized Edema, Rash, Scarring, Dry/Scaly, Atrophie Blanche, Cyanosis, Ecchymosis, Hemosiderin Staining, Mottled, Pallor, Rubor, Erythema. Periwound temperature was noted as No Abnormality. The periwound has tenderness on palpation. [name], [name] ([id number]) Assessment Active Problems ICD-10  E11.622 - Type 2 diabetes mellitus with other skin ulcer E11.610 - Type 2 diabetes mellitus with diabetic neuropathic arthropathy I87.331 - Chronic venous hypertension (idiopathic) with ulcer and inflammation of right lower extremity L03.115 - Cellulitis of right lower limb Diagnoses ICD-10 E11.622: Type 2 diabetes mellitus with other skin ulcer E11.610: Type 2 diabetes mellitus with diabetic neuropathic arthropathy I87.331: Chronic venous hypertension (idiopathic) with ulcer and inflammation of right lower extremity L03.115: Cellulitis of right lower limb Patient has venous ulcer right leg. Will postpone Apligraft due increase of neccrotic material Today debrided with curette and used alginate and compressiion Procedures Wound #1 Wound #1 is a Venous Leg Ulcer located on the Right,Medial Lower Leg . There was a Skin/Subcutaneous Tissue Debridement [id number]) debridement with total area of 9.6 sq cm performed by Judene Companion, MD. with the following instrument(s): Curette to remove Viable and Non-Viable tissue/material including Fibrin/Slough and Subcutaneous after achieving pain control using Lidocaine 4% Topical Solution. A time out was conducted prior to the  start of the procedure. A Minimum amount of bleeding was controlled with Pressure. The procedure was tolerated well with a pain level of 0 throughout and a pain level of 7 following the procedure. Post Debridement Measurements: 4.8cm length x 2cm width x 0.1cm depth; 0.754cm^3 volume. Plan Wound Cleansing: Wound #1 Right,Medial Lower Leg: Cleanse wound with mild soap and water Anesthetic: Wound #1 Right,Medial Lower Leg: Topical Lidocaine 4% cream applied to wound bed prior to debridement [name], [name] ([id number]) Primary Wound Dressing: Wound #1 Right,Medial Lower Leg: Aquacel Ag Secondary Dressing: Wound #1 Right,Medial Lower Leg: XtraSorb Dressing Change Frequency: Wound #1 Right,Medial Lower Leg: Change dressing every week Follow-up Appointments: Wound #1 Right,Medial Lower Leg: Return Appointment in 1 week. Edema Control: Wound #1 Right,Medial Lower Leg: 2 Layer Lite Compression System - Right Lower Extremity Additional Orders / Instructions: Wound #1 Right,Medial Lower Leg: Increase protein intake. Follow-Up Appointments: A follow-up appointment should be scheduled. Electronic Signature(s) Signed: 11/11/2014 4:35:09 PM By: Lorine Bears RCP, RRT, CHT Previous Signature: 10/30/2014 2:26:32 PM Version By: Judene Companion MD Entered By: Lorine Bears on 11/11/2014 16:35:09 [name], [name] ([id number]) -------------------------------------------------------------------------------- SuperBill Details Patient Name: [name], Dhanya C. Date of Service: 10/30/2014 Medical Record Number: [id number] Patient Account Number: [id number] Date of Birth/Sex: [birth year] (81 y.o. Female) Treating RN: Primary Care Physician: Ria Bush Other Clinician: Referring Physician: Ria Bush Treating Physician/Extender: Benjaman Pott in Treatment: 10 Diagnosis Coding ICD-10 Codes Code Description E11.622 Type 2 diabetes mellitus with other  skin ulcer E11.610 Type 2 diabetes mellitus with diabetic neuropathic arthropathy Chronic venous hypertension (idiopathic) with ulcer and inflammation of right lower I87.331 extremity L03.115 Cellulitis of right lower limb Facility Procedures CPT4: Description Modifier Quantity Code [id number] - DEB SUBQ TISSUE 20 SQ CM/< 1 ICD-10 Description Diagnosis I87.331 Chronic venous hypertension (idiopathic) with ulcer and inflammation of right lower extremity Physician Procedures CPT4: Description Modifier Quantity Code [id number] - WC PHYS SUBQ TISS 20 SQ CM 1 ICD-10 Description Diagnosis I87.331 Chronic venous hypertension (idiopathic) with ulcer and inflammation of right lower extremity Electronic Signature(s) Signed: 10/30/2014 2:26:53 PM By: Judene Companion MD Entered By: Judene Companion on 10/30/2014 14:26:53

## 2014-10-31 NOTE — Patient Instructions (Signed)
Check blood sugars on waking up .Marland Kitchen1-2  .. times a week Also check blood sugars about 2 hours after a meal and do this after different meals by rotation  Recommended blood sugar levels on waking up is 90-130 and about 2 hours after meal is 140-180 Please bring blood sugar monitor to each visit.

## 2014-11-04 ENCOUNTER — Other Ambulatory Visit: Payer: Self-pay

## 2014-11-04 NOTE — Telephone Encounter (Signed)
Pt left v/m requesting rx hydrocodone apap. Call when ready for pick up. Pt last seen 09/02/14 and rx last printed # 60 on 10/07/14.Please advise.

## 2014-11-05 MED ORDER — HYDROCODONE-ACETAMINOPHEN 5-325 MG PO TABS: 0.5000 | ORAL_TABLET | Freq: Four times a day (QID) | ORAL | Status: DC | PRN

## 2014-11-05 NOTE — Telephone Encounter (Signed)
Printed and in kims'box  

## 2014-11-05 NOTE — Telephone Encounter (Signed)
Patient notified and Rx placed up front for pick up. 

## 2014-11-06 ENCOUNTER — Encounter: Payer: Medicare Other | Attending: Surgery | Admitting: Surgery

## 2014-11-06 DIAGNOSIS — L97211 Non-pressure chronic ulcer of right calf limited to breakdown of skin: Secondary | ICD-10-CM | POA: Diagnosis not present

## 2014-11-06 DIAGNOSIS — E11610 Type 2 diabetes mellitus with diabetic neuropathic arthropathy: Secondary | ICD-10-CM | POA: Diagnosis not present

## 2014-11-06 DIAGNOSIS — E11622 Type 2 diabetes mellitus with other skin ulcer: Secondary | ICD-10-CM | POA: Diagnosis not present

## 2014-11-06 DIAGNOSIS — I87331 Chronic venous hypertension (idiopathic) with ulcer and inflammation of right lower extremity: Secondary | ICD-10-CM | POA: Insufficient documentation

## 2014-11-06 DIAGNOSIS — L03115 Cellulitis of right lower limb: Secondary | ICD-10-CM | POA: Insufficient documentation

## 2014-11-06 DIAGNOSIS — I10 Essential (primary) hypertension: Secondary | ICD-10-CM | POA: Diagnosis not present

## 2014-11-06 NOTE — Progress Notes (Addendum)
[name] ([id number]) Visit Report for 11/06/2014 Chief Complaint Document Details Patient Name: [name], [name] Date of Service: 11/06/2014 1:45 PM Medical Record Number: [id number] Patient Account Number: [id number] Date of Birth/Sex: 17-May-1927 (81 y.o. Female) Treating RN: Primary Care Physician: [name] Other Clinician: Referring Physician: Ria [name] Treating Physician/Extender: [name] in Treatment: 11 Information Obtained from: Patient Chief Complaint Patient presents for treatment of an open ulcer due to venous insufficiency. 81 year old patient who is known to me from the [facility] wound care center now comes in follow up here for a chronic venous ulceration of her right lower extremity which she's had for about 6 months. Electronic Signature(s) Signed: 11/06/2014 2:41:04 PM By: [name] Fudge MD, FACS Entered By: [name] on 11/06/2014 14:41:04 [name] ([id number]) -------------------------------------------------------------------------------- HPI Details Patient Name: [name], [name] Date of Service: 11/06/2014 1:45 PM Medical Record Number: [id number] Patient Account Number: [id number] Date of Birth/Sex: 05-Jun-1927 (81 y.o. Female) Treating RN: Primary Care Physician: [name] Other Clinician: Referring Physician: Ria [name] Treating Physician/Extender: [name] in Treatment: 11 History of Present Illness Location: ulcer in the right lower extremity for several months Quality: Patient reports experiencing a dull pain to affected area(s). Severity: Patient states wound (s) are getting better. Duration: Patient has had the wound for > 3 months prior to seeking treatment at the wound center Timing: Pain in wound is Intermittent (comes and goes Context: The wound appeared gradually over time Modifying Factors: Consults to this date include: vascular surgery for the veins in December 2015  and she's been seeing the wound care center at [facility] for several months. Associated Signs and Symptoms: Patient reports having difficulty standing for long periods. HPI Description: This is an 81 yrs old female we have been following since the beginning of January for a wound on the right medial ankle. She had previously had venous ablation of the greater saphenous vein by [name] She came to [place] with erythema of the right leg and subsequently is opened in the medial aspect of the right ankle. She has not been very compliant with compression. We ordered her at juxta lite stocking, however she apparently has not been able to wear it. The area on the medial aspect of her left ankle has much less induration and erythema. The edema is better controlled. No evidence of infection is noted. 07/18/14; no major change in the condition of this wound. There is still surface slough and probably some drainage. 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 All other veins showed no evidence of DVT, superficial venous thrombosis or incompetence. however she does have manifestations of chronic venous hypertension and I believe we will change her dressing over to Prisma and a Profore light dressing. she sees her primary care doctor regularly and her last hemoglobin A1c has been around 7%.her other comorbidities are hypertension, CABG in 1998, peptic ulcer disease, history of echo with a ejection fraction of 55-60% and grade 1 diastolic dysfunction. past surgical history include coronary artery bypass graft in 1998, cholecystectomy, appendectomy, hand surgery, endovenous ablation of the saphenous vein with laser by [name] in December 2015. 08/29/2014 - she complains of increased calf pain over the past week. removed her compression bandage and pain improved. no fever or chills. Mild  clear drainage. 09/04/2014 -- she has not been compliant and has not done a  dressing change as we had ordered last week. She always complains and does not keep her wrap on and this past week she has done the same. She has completed a course of doxycycline. 09/25/2014 -- she has tolerated her dressing all week and has not removed it. She says it's hurting a lot today and would not like any debridement. We will do so as per her wishes. she did get a appointment with the vascular surgeon and sees him on 6/28. [name], [name] ([id number]) 10/03/2014 - She was seen by [name] 09/30/2014. Assessment: I reviewed the previous venous duplex exam study performed in September 2015 which reveals no options for treating this venous ulcer. There is no significant superficial venous reflux to treat with laser ablation.Most recent ABI was 0.91 in the right foot with palpable dorsalis pedis pulse at 3+ Plan: patient needs to return to the [facility] wound center and be treated by [name] No arterial or venous options available from vascular standpoint or treatment of right leg stasis ulcer.Continued dressing changes and compression is the treatment of choice 10/16/2014 -- today will be her first application of Apligraf. 10/23/2014 -- we are going to be able to apply her second day of Apligraf next week. She is complaining of some tenderness in the region of Achilles tendon and this is possibly due to her wrap. 11/06/2014 -- she did not have Apligraf last week because the wound was not looking good but she says she is feeling much better now. Electronic Signature(s) Signed: 11/06/2014 2:41:37 PM By: [name] Fudge MD, FACS Entered By: [name] on 11/06/2014 14:41:36 [name], [name] -------------------------------------------------------------------------------- Physical Exam Details Patient Name: [name], [name] Date of Service: 11/06/2014 1:45 PM Medical Record Number:  [id number] Patient Account Number: [id number] Date of Birth/Sex: [birth year] (81 y.o. Female) Treating RN: Primary Care Physician: [name] Other Clinician: Referring Physician: Ria [name] Treating Physician/Extender: [name] in Treatment: 11 Constitutional . Pulse regular. Respirations normal and unlabored. Afebrile. . Eyes Nonicteric. Reactive to light. Ears, Nose, Mouth, and Throat Lips, teeth, and gums WNL.Marland Kitchen Moist mucosa without lesions . Neck supple and nontender. No palpable supraclavicular or cervical adenopathy. Normal sized without goiter. Respiratory WNL. No retractions.. Cardiovascular Pedal Pulses WNL. No clubbing, cyanosis or edema. Lymphatic No adneopathy. No adenopathy. No adenopathy. Musculoskeletal Adexa without tenderness or enlargement.. Digits and nails w/o clubbing, cyanosis, infection, petechiae, ischemia, or inflammatory conditions.. Integumentary (Hair, Skin) No suspicious lesions. No crepitus or fluctuance. No peri-wound warmth or erythema. No masses.Marland Kitchen Psychiatric Judgement and insight Intact.. No evidence of depression, anxiety, or agitation.. Notes The wound looks better overall and there is healthy granulation tissue.No debridement is required. Electronic Signature(s) Signed: 11/06/2014 2:42:15 PM By: [name] Fudge MD, FACS Entered By: [name] on 11/06/2014 14:42:14 Mummert, [name] -------------------------------------------------------------------------------- Physician Orders Details Patient Name: [name], [name] Date of Service: 11/06/2014 1:45 PM Medical Record Number: [id number] Patient Account Number: [id number] Date of Birth/Sex: 17-May-1927 (81 y.o. Female) Treating RN: Cornell Barman Primary Care Physician: [name] Other Clinician: Referring Physician: Ria [name] Treating Physician/Extender: [name] in Treatment: 11 Verbal / Phone Orders: Yes Clinician: Cornell Barman Read Back and Verified: Yes Diagnosis Coding ICD-10 Coding Code Description E11.622 Type 2 diabetes mellitus with other skin ulcer E11.610 Type 2 diabetes mellitus with diabetic neuropathic arthropathy Chronic venous hypertension (idiopathic) with ulcer and inflammation of right lower I87.331 extremity L03.115 Cellulitis of right lower limb Wound Cleansing Wound #1 Right,Medial Lower Leg o Cleanse  wound with mild soap and water Anesthetic Wound #1 Right,Medial Lower Leg o Topical Lidocaine 4% cream applied to wound bed prior to debridement Primary Wound Dressing Wound #1 Right,Medial Lower Leg o Promogran Secondary Dressing Wound #1 Right,Medial Lower Leg o Drawtex o ABD pad Dressing Change Frequency Wound #1 Right,Medial Lower Leg o Change dressing every week Follow-up Appointments Wound #1 Right,Medial Lower Leg o Return Appointment in 1 week. Edema Control [name], [name] ([id number]) o 2 Layer Lite Compression System - Right Lower Extremity Additional Orders / Instructions Wound #1 Right,Medial Lower Leg o Increase protein intake. Electronic Signature(s) Signed: 11/06/2014 3:55:58 PM By: Gretta Cool RN, BSN, Kim RN, BSN Signed: 11/07/2014 9:20:30 AM By: [name] Fudge MD, FACS Entered By: Gretta Cool RN, BSN, Kim on 11/06/2014 15:26:44 Wickens, [name] ([id number]) -------------------------------------------------------------------------------- Problem List Details Patient Name: [name], [name] Date of Service: 11/06/2014 1:45 PM Medical Record Number: [id number] Patient Account Number: [id number] Date of Birth/Sex: 02-Aug-1927 (81 y.o. Female) Treating RN: Primary Care Physician: [name] Other Clinician: Referring Physician: Ria [name] Treating Physician/Extender: [name] in Treatment: 11 Active Problems ICD-10 Encounter Code Description Active Date Diagnosis E11.622 Type 2 diabetes mellitus with other skin ulcer  08/15/2014 Yes E11.610 Type 2 diabetes mellitus with diabetic neuropathic 08/15/2014 Yes arthropathy I87.331 Chronic venous hypertension (idiopathic) with ulcer and 08/15/2014 Yes inflammation of right lower extremity L03.115 Cellulitis of right lower limb 08/29/2014 Yes Inactive Problems Resolved Problems Electronic Signature(s) Signed: 11/06/2014 2:40:58 PM By: [name] Fudge MD, FACS Entered By: [name] on 11/06/2014 14:40:57 Kindel, [name] ([id number]) -------------------------------------------------------------------------------- Progress Note Details Patient Name: [name], [name] Date of Service: 11/06/2014 1:45 PM Medical Record Number: [id number] Patient Account Number: [id number] Date of Birth/Sex: [birth year] (81 y.o. Female) Treating RN: Primary Care Physician: [name] Other Clinician: Referring Physician: Ria [name] Treating Physician/Extender: [name] in Treatment: 11 Subjective Chief Complaint Information obtained from Patient Patient presents for treatment of an open ulcer due to venous insufficiency. 81 year old patient who is known to me from the [facility] wound care center now comes in follow up here for a chronic venous ulceration of her right lower extremity which she's had for about 6 months. History of Present Illness (HPI) The following HPI elements were documented for the patient's wound: Location: ulcer in the right lower extremity for several months Quality: Patient reports experiencing a dull pain to affected area(s). Severity: Patient states wound (s) are getting better. Duration: Patient has had the wound for > 3 months prior to seeking treatment at the wound center Timing: Pain in wound is Intermittent (comes and goes Context: The wound appeared gradually over time Modifying Factors: Consults to this date include: vascular surgery for the veins in December 2015 and she's been seeing the wound care center at [facility]  for several months. Associated Signs and Symptoms: Patient reports having difficulty standing for long periods. This is an 81 yrs old female we have been following since the beginning of January for a wound on the right medial ankle. She had previously had venous ablation of the greater saphenous vein by [name] She came to [place] with erythema of the right leg and subsequently is opened in the medial aspect of the right ankle. She has not been very compliant with compression. We ordered her at juxta lite stocking, however she apparently has not been able to wear it. The area on the medial aspect of her left ankle has much less induration and erythema. The edema is better controlled. No evidence  of infection is noted. 07/18/14; no major change in the condition of this wound. There is still surface slough and probably some drainage. 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 All other veins showed no evidence of DVT, superficial venous thrombosis or incompetence. however she does have manifestations of chronic venous hypertension and I believe we will change her dressing over to Prisma and a Profore light dressing. she sees her primary care doctor regularly and her last hemoglobin A1c has been around 7%.her other comorbidities are hypertension, CABG in 1998, peptic ulcer disease, history of echo with a ejection fraction of 55-60% and grade 1 diastolic dysfunction. past surgical history include coronary artery bypass graft in 1998, cholecystectomy, appendectomy, hand surgery, endovenous ablation of the saphenous vein with laser by [name] in December 2015. [name], [name] ([id number]) 08/29/2014 - she complains of increased calf pain over the past week. removed her compression bandage and pain improved. no fever or chills. Mild clear drainage. 09/04/2014 -- she has not been  compliant and has not done a dressing change as we had ordered last week. She always complains and does not keep her wrap on and this past week she has done the same. She has completed a course of doxycycline. 09/25/2014 -- she has tolerated her dressing all week and has not removed it. She says it's hurting a lot today and would not like any debridement. We will do so as per her wishes. she did get a appointment with the vascular surgeon and sees him on 6/28. 10/03/2014 - She was seen by [name] 09/30/2014. Assessment: I reviewed the previous venous duplex exam study performed in September 2015 which reveals no options for treating this venous ulcer. There is no significant superficial venous reflux to treat with laser ablation.Most recent ABI was 0.91 in the right foot with palpable dorsalis pedis pulse at 3+ Plan: patient needs to return to the [place] wound center and be treated by [name] No arterial or venous options available from vascular standpoint or treatment of right leg stasis ulcer.Continued dressing changes and compression is the treatment of choice 10/16/2014 -- today will be her first application of Apligraf. 10/23/2014 -- we are going to be able to apply her second day of Apligraf next week. She is complaining of some tenderness in the region of Achilles tendon and this is possibly due to her wrap. 11/06/2014 -- she did not have Apligraf last week because the wound was not looking good but she says she is feeling much better now. Objective Constitutional Pulse regular. Respirations normal and unlabored. Afebrile. Vitals Time Taken: 1:59 PM, Height: 58 in, Weight: 151 lbs, BMI: 31.6, Pulse: 76 bpm, Respiratory Rate: 18 breaths/min, Blood Pressure: 118/60 mmHg. Eyes Nonicteric. Reactive to light. Ears, Nose, Mouth, and Throat Lips, teeth, and gums WNL.Marland Kitchen Moist mucosa without lesions . Neck supple and nontender. No palpable supraclavicular or cervical  adenopathy. Normal sized without goiter. Respiratory WNL. No retractions.Marland Kitchen [name], [name] ([id number]) Cardiovascular Pedal Pulses WNL. No clubbing, cyanosis or edema. Lymphatic No adneopathy. No adenopathy. No adenopathy. Musculoskeletal Adexa without tenderness or enlargement.. Digits and nails w/o clubbing, cyanosis, infection, petechiae, ischemia, or inflammatory conditions.Marland Kitchen Psychiatric Judgement and insight Intact.. No evidence of depression, anxiety, or agitation.. General Notes: The wound looks better overall and there is healthy granulation tissue.No debridement is required. Integumentary (Hair, Skin) No suspicious  lesions. No crepitus or fluctuance. No peri-wound warmth or erythema. No masses.. Wound #1 status is Open. Original cause of wound was Gradually Appeared. The wound is located on the Right,Medial Lower Leg. The wound measures 5.6cm length x 4cm width x 0.1cm depth; 17.593cm^2 area and 1.759cm^3 volume. The wound is limited to skin breakdown. There is a large amount of serosanguineous drainage noted. The wound margin is flat and intact. There is large (67-100%) red, pink granulation within the wound bed. There is a small (1-33%) amount of necrotic tissue within the wound bed including Adherent Slough. The periwound skin appearance exhibited: Excoriation, Maceration, Moist, Erythema. The periwound skin appearance did not exhibit: Callus, Crepitus, Fluctuance, Friable, Induration, Localized Edema, Rash, Scarring, Dry/Scaly, Atrophie Blanche, Cyanosis, Ecchymosis, Hemosiderin Staining, Mottled, Pallor, Rubor. The surrounding wound skin color is noted with erythema which is circumferential. Periwound temperature was noted as No Abnormality. The periwound has tenderness on palpation. Assessment Active Problems ICD-10 E11.622 - Type 2 diabetes mellitus with other skin ulcer E11.610 - Type 2 diabetes mellitus with diabetic neuropathic arthropathy I87.331 - Chronic  venous hypertension (idiopathic) with ulcer and inflammation of right lower extremity L03.115 - Cellulitis of right lower limb [name], [name] ([id number]) The wound having improved I would use collagen and a 2 layer wrap. She will be ready for her second application of Apligraf next week. Plan Wound Cleansing: Wound #1 Right,Medial Lower Leg: Cleanse wound with mild soap and water Anesthetic: Wound #1 Right,Medial Lower Leg: Topical Lidocaine 4% cream applied to wound bed prior to debridement Primary Wound Dressing: Wound #1 Right,Medial Lower Leg: Promogran Secondary Dressing: Wound #1 Right,Medial Lower Leg: Drawtex ABD pad Dressing Change Frequency: Wound #1 Right,Medial Lower Leg: Change dressing every week Follow-up Appointments: Wound #1 Right,Medial Lower Leg: Return Appointment in 1 week. Edema Control: 2 Layer Lite Compression System - Right Lower Extremity Additional Orders / Instructions: Wound #1 Right,Medial Lower Leg: Increase protein intake. The wound having improved I would use collagen and a 2 layer wrap. She will be ready for her second application of Apligraf next week. Electronic Signature(s) Signed: 11/07/2014 8:54:28 AM By: [name] Fudge MD, FACS Previous Signature: 11/06/2014 2:43:25 PM Version By: [name] Fudge MD, FACS Entered By: [name] on 11/07/2014 08:54:28 [name] ([id number]) -------------------------------------------------------------------------------- SuperBill Details Patient Name: [name], [name] Date of Service: 11/06/2014 Medical Record Number: [id number] Patient Account Number: [id number] Date of Birth/Sex: [birth year] (81 y.o. Female) Treating RN: Primary Care Physician: [name] Other Clinician: Referring Physician: Ria [name] Treating Physician/Extender: [name] in Treatment: 11 Diagnosis Coding ICD-10 Codes Code Description E11.622 Type 2 diabetes mellitus with other skin  ulcer E11.610 Type 2 diabetes mellitus with diabetic neuropathic arthropathy Chronic venous hypertension (idiopathic) with ulcer and inflammation of right lower I87.331 extremity L03.115 Cellulitis of right lower limb Facility Procedures CPT4: Description Modifier Quantity Code [id number] (Facility Use Only) [phone] - Mellen [id number] LWR RT 1 LEG Physician Procedures CPT4: Description Modifier Quantity Code [id number] - WC PHYS LEVEL 3 - EST PT 1 ICD-10 Description Diagnosis E11.622 Type 2 diabetes mellitus with other skin ulcer E11.610 Type 2 diabetes mellitus with diabetic neuropathic arthropathy I87.331 Chronic  venous hypertension (idiopathic) with ulcer and inflammation of right lower extremity Electronic Signature(s) Signed: 11/06/2014 3:55:58 PM By: Gretta Cool, RN, BSN, Kim RN, BSN Signed: 11/07/2014 9:20:30 AM By: [name] Fudge MD, FACS Previous Signature: 11/06/2014 2:43:44 PM Version By: [name] Fudge MD, FACS Entered By: Gretta Cool, RN, BSN, Kim on 11/06/2014 15:28:02

## 2014-11-07 NOTE — Progress Notes (Signed)
TACIA, VARGASON ([id number]) Visit Report for 11/06/2014 Arrival Information Details Patient Name: [name], [name] Date of Service: 11/06/2014 1:45 PM Medical Record Number: [id number] Patient Account Number: [id number] Date of Birth/Sex: 07/17/1927 (81 y.o. Female) Treating RN: Cornell Barman Primary Care Physician: Ria Bush Other Clinician: Referring Physician: Ria Bush Treating Physician/Extender: Frann Rider in Treatment: 11 Visit Information History Since Last Visit Added or deleted any medications: No Patient Arrived: Walker Any new allergies or adverse reactions: No Arrival Time: 13:58 Had a fall or experienced change in No Accompanied By: son in lobby activities of daily living that may affect Transfer Assistance: None risk of falls: Patient Identification Verified: Yes Signs or symptoms of abuse/neglect since last No Secondary Verification Process Yes visito Completed: Has Dressing in Place as Prescribed: Yes Patient Has Alerts: Yes Pain Present Now: Yes Patient Alerts: Patient on Blood Thinner Type II Diabetic 81 MG aspirin Electronic Signature(s) Signed: 11/06/2014 3:55:58 PM By: Gretta Cool, RN, BSN, Kim RN, BSN Entered By: Gretta Cool, RN, BSN, Kim on 11/06/2014 13:58:42 Doan, [name] ([id number]) -------------------------------------------------------------------------------- Encounter Discharge Information Details Patient Name: [name], [name] Date of Service: 11/06/2014 1:45 PM Medical Record Number: [id number] Patient Account Number: [id number] Date of Birth/Sex: 07-09-1927 (81 y.o. Female) Treating RN: Primary Care Physician: Ria Bush Other Clinician: Referring Physician: Ria Bush Treating Physician/Extender: Frann Rider in Treatment: 11 Encounter Discharge Information Items Discharge Pain Level: 0 Discharge Condition: Stable Ambulatory Status: Walker Discharge Destination: Home Transportation: Private  Auto Accompanied By: self Schedule Follow-up Appointment: Yes Medication Reconciliation completed Yes and provided to Patient/Care Tyler Robidoux: Provided on Clinical Summary of Care: 11/06/2014 Form Type Recipient Paper Patient LL Electronic Signature(s) Signed: 11/06/2014 3:55:58 PM By: Gretta Cool RN, BSN, Kim RN, BSN Previous Signature: 11/06/2014 2:27:39 PM Version By: Ruthine Dose Entered By: Gretta Cool RN, BSN, Kim on 11/06/2014 15:29:17 Noecker, [name] ([id number]) -------------------------------------------------------------------------------- Lower Extremity Assessment Details Patient Name: [name], [name] Date of Service: 11/06/2014 1:45 PM Medical Record Number: [id number] Patient Account Number: [id number] Date of Birth/Sex: June 09, 1927 (81 y.o. Female) Treating RN: Cornell Barman Primary Care Physician: Ria Bush Other Clinician: Referring Physician: Ria Bush Treating Physician/Extender: Frann Rider in Treatment: 11 Edema Assessment Assessed: [Left: No] [Right: No] E[Left: dema] [Right: :] Calf Left: Right: Point of Measurement: 27 cm From Medial Instep cm 26.6 cm Ankle Left: Right: Point of Measurement: 10 cm From Medial Instep cm 20 cm Vascular Assessment Pulses: Posterior Tibial Palpable: [Right:Yes] Dorsalis Pedis Palpable: [Right:Yes] Extremity colors, hair growth, and conditions: Extremity Color: [Right:Mottled] Hair Growth on Extremity: [Right:No] Temperature of Extremity: [Right:Warm] Capillary Refill: [Right:< 3 seconds] Toe Nail Assessment Left: Right: Thick: No Discolored: No Deformed: No Improper Length and Hygiene: No Electronic Signature(s) Signed: 11/06/2014 3:55:58 PM By: Gretta Cool, RN, BSN, Kim RN, BSN Entered By: Gretta Cool, RN, BSN, Kim on 11/06/2014 14:06:10 Arens, [name] ([id number]) Quiros, Adlee Loletha Grayer ([id number]) -------------------------------------------------------------------------------- Multi Wound Chart Details Patient  Name: [name], [name] Date of Service: 11/06/2014 1:45 PM Medical Record Number: [id number] Patient Account Number: [id number] Date of Birth/Sex: Mar 14, 1928 (81 y.o. Female) Treating RN: Cornell Barman Primary Care Physician: Ria Bush Other Clinician: Referring Physician: Ria Bush Treating Physician/Extender: Frann Rider in Treatment: 11 Vital Signs Height(in): 58 Pulse(bpm): 76 Weight(lbs): 151 Blood Pressure 118/60 (mmHg): Body Mass Index(BMI): 32 Temperature(F): Respiratory Rate 18 (breaths/min): Photos: [1:No Photos] [N/A:N/A] Wound Location: [1:Right Lower Leg - Medial] [N/A:N/A] Wounding Event: [1:Gradually Appeared] [N/A:N/A] Primary Etiology: [1:Venous Leg Ulcer] [N/A:N/A] Comorbid History: [1:Cataracts, Anemia, Coronary Artery Disease, Hypertension, Myocardial  Infarction, Type II Diabetes, Osteoarthritis] [N/A:N/A] Date Acquired: [1:04/04/2014] [N/A:N/A] Weeks of Treatment: [1:11] [N/A:N/A] Wound Status: [1:Open] [N/A:N/A] Measurements L x W x D 5.6x4x0.1 [N/A:N/A] (cm) Area (cm) : [1:17.593] [N/A:N/A] Volume (cm) : [1:1.759] [N/A:N/A] % Reduction in Area: [1:-198.70%] [N/A:N/A] % Reduction in Volume: 0.50% [N/A:N/A] Classification: [1:Full Thickness Without Exposed Support Structures] [N/A:N/A] HBO Classification: [1:Grade 1] [N/A:N/A] Exudate Amount: [1:Large] [N/A:N/A] Exudate Type: [1:Serosanguineous] [N/A:N/A] Exudate Color: [1:red, brown] [N/A:N/A] Wound Margin: [1:Flat and Intact] [N/A:N/A] Granulation Amount: [1:Large (67-100%)] [N/A:N/A] Granulation Quality: [1:Red, Pink, Hyper- granulation] [N/A:N/A] Necrotic Amount: [1:Small (1-33%)] [N/A:N/A] Exposed Structures: Fascia: No N/A N/A Fat: No Tendon: No Muscle: No Joint: No Bone: No Limited to Skin Breakdown Epithelialization: Small (1-33%) N/A N/A Periwound Skin Texture: Excoriation: Yes N/A N/A Edema: No Induration: No Callus: No Crepitus: No Fluctuance:  No Friable: No Rash: No Scarring: No Periwound Skin Maceration: Yes N/A N/A Moisture: Moist: Yes Dry/Scaly: No Periwound Skin Color: Erythema: Yes N/A N/A Atrophie Blanche: No Cyanosis: No Ecchymosis: No Hemosiderin Staining: No Mottled: No Pallor: No Rubor: No Erythema Location: Circumferential N/A N/A Temperature: No Abnormality N/A N/A Tenderness on Yes N/A N/A Palpation: Wound Preparation: Ulcer Cleansing: Other: N/A N/A soap and water Topical Anesthetic Applied: Other: lidocaine4% Treatment Notes Electronic Signature(s) Signed: 11/06/2014 3:55:58 PM By: Gretta Cool, RN, BSN, Kim RN, BSN Entered By: Gretta Cool, RN, BSN, Kim on 11/06/2014 15:27:15 Jaffee, [name] -------------------------------------------------------------------------------- [place] Details Patient Name: [name], [name] Date of Service: 11/06/2014 1:45 PM Medical Record Number: [id number] Patient Account Number: [id number] Date of Birth/Sex: 27-May-1927 (81 y.o. Female) Treating RN: Cornell Barman Primary Care Physician: Ria Bush Other Clinician: Referring Physician: Ria Bush Treating Physician/Extender: Frann Rider in Treatment: 11 Active Inactive Abuse / Safety / Falls / Self Care Management Nursing Diagnoses: Potential for falls Goals: Patient will remain injury free Date Initiated: 08/15/2014 Goal Status: Active Interventions: Assess fall risk on admission and as needed Notes: Nutrition Nursing Diagnoses: Impaired glucose control: actual or potential Goals: Patient/caregiver verbalizes understanding of need to maintain therapeutic glucose control per primary care physician Date Initiated: 08/15/2014 Goal Status: Active Interventions: Assess patient nutrition upon admission and as needed per policy Notes: Orientation to the Wound Care Program Nursing Diagnoses: Knowledge deficit related to the wound healing center  program Goals: Patient/caregiver will verbalize understanding of the [place], Eloise C. ([id number]) Date Initiated: 08/15/2014 Goal Status: Active Interventions: Provide education on orientation to the wound center Notes: Venous Leg Ulcer Nursing Diagnoses: Actual venous Insuffiency (use after diagnosis is confirmed) Goals: Patient will maintain optimal edema control Date Initiated: 08/15/2014 Goal Status: Active Interventions: Compression as ordered Treatment Activities: Test ordered outside of clinic : 11/06/2014 Notes: Wound/Skin Impairment Nursing Diagnoses: Impaired tissue integrity Goals: Patient/caregiver will verbalize understanding of skin care regimen Date Initiated: 08/15/2014 Goal Status: Active Interventions: Assess patient/caregiver ability to obtain necessary supplies Notes: Electronic Signature(s) Signed: 11/06/2014 3:55:58 PM By: Gretta Cool, RN, BSN, Kim RN, BSN Entered By: Gretta Cool, RN, BSN, Kim on 11/06/2014 15:27:06 [name], [name] -------------------------------------------------------------------------------- Pain Assessment Details Patient Name: [name], [name] Date of Service: 11/06/2014 1:45 PM Medical Record Number: [id number] Patient Account Number: [id number] Date of Birth/Sex: 1928-01-31 (81 y.o. Female) Treating RN: Cornell Barman Primary Care Physician: Ria Bush Other Clinician: Referring Physician: Ria Bush Treating Physician/Extender: Frann Rider in Treatment: 11 Active Problems Location of Pain Severity and Description of Pain Patient Has [name] Yes Site Locations Pain Location: Pain in Ulcers Duration of the Pain. Constant /  Intermittento Intermittent Rate the pain. Current Pain Level: 5 Character of Pain Describe the Pain: Sharp, Tender, Throbbing Pain Management and Medication Current Pain Management: Medication: No Cold Application: No Rest: No Massage: No Activity:  No T.E.N.S.: No Heat Application: No Leg drop or elevation: No Is the Current Pain Management Inadequate Adequate: How does your pain impact your activities of daily livingo Sleep: No Bathing: No Appetite: No Relationship With Others: No Bladder Continence: No Emotions: No Bowel Continence: No Work: No Toileting: No Drive: No Dressing: No Hobbies: No Engineer, maintenance) Signed: 11/06/2014 3:55:58 PM By: Gretta Cool, RN, BSN, Kim RN, BSN [name], [name]  ([id number]) Entered By: Gretta Cool, RN, BSN, Kim on 11/06/2014 13:59:12 [name], [name] -------------------------------------------------------------------------------- Patient/Caregiver Education Details Patient Name: [name], [name] Date of Service: 11/06/2014 1:45 PM Medical Record Number: [id number] Patient Account Number: [id number] Date of Birth/Gender: March 02, 1928 (81 y.o. Female) Treating RN: Cornell Barman Primary Care Physician: Ria Bush Other Clinician: Referring Physician: Ria Bush Treating Physician/Extender: Frann Rider in Treatment: 11 Education Assessment Education Provided To: Patient Education Topics Provided Venous: Handouts: Other: Elevate legs and pump ankles Electronic Signature(s) Signed: 11/06/2014 3:55:58 PM By: Gretta Cool, RN, BSN, Kim RN, BSN Entered By: Gretta Cool, RN, BSN, Kim on 11/06/2014 15:29:46 [name], [name] -------------------------------------------------------------------------------- Wound Assessment Details Patient Name: [name], [name] Date of Service: 11/06/2014 1:45 PM Medical Record Number: [id number] Patient Account Number: [id number] Date of Birth/Sex: 21-Nov-1927 (81 y.o. Female) Treating RN: Cornell Barman Primary Care Physician: Ria Bush Other Clinician: Referring Physician: Ria Bush Treating Physician/Extender: Frann Rider in Treatment: 11 Wound Status Wound Number: 1 Primary Venous Leg Ulcer Etiology: Wound  Location: Right Lower Leg - Medial Wound Open Wounding Event: Gradually Appeared Status: Date Acquired: 04/04/2014 Comorbid Cataracts, Anemia, Coronary Artery Weeks Of Treatment: 11 History: Disease, Hypertension, Myocardial Clustered Wound: No Infarction, Type II Diabetes, Osteoarthritis Photos Photo Uploaded By: Gretta Cool, RN, BSN, Kim on 11/06/2014 15:38:09 Wound Measurements Length: (cm) 5.6 Width: (cm) 4 Depth: (cm) 0.1 Area: (cm) 17.593 Volume: (cm) 1.759 % Reduction in Area: -198.7% % Reduction in Volume: 0.5% Epithelialization: Small (1-33%) Wound Description Full Thickness Without Exposed Foul Odor Af Classification: Support Structures Grade 1 Vint, Jourden C. ([id number]) ter Cleansing: No Diabetic Severity Earleen Newport): Wound Margin: Flat and Intact Exudate Amount: Large Exudate Type: Serosanguineous Exudate Color: red, brown Wound Bed Granulation Amount: Large (67-100%) Exposed Structure Granulation Quality: Red, Pink, Hyper-granulation Fascia Exposed: No Necrotic Amount: Small (1-33%) Fat Layer Exposed: No Necrotic Quality: Adherent Slough Tendon Exposed: No Muscle Exposed: No Joint Exposed: No Bone Exposed: No Limited to Skin Breakdown Periwound Skin Texture Texture Color No Abnormalities Noted: No No Abnormalities Noted: No Callus: No Atrophie Blanche: No Crepitus: No Cyanosis: No Excoriation: Yes Ecchymosis: No Fluctuance: No Erythema: Yes Friable: No Erythema Location: Circumferential Induration: No Hemosiderin Staining: No Localized Edema: No Mottled: No Rash: No Pallor: No Scarring: No Rubor: No Moisture Temperature / Pain No Abnormalities Noted: No Temperature: No Abnormality Dry / Scaly: No Tenderness on Palpation: Yes Maceration: Yes Moist: Yes Wound Preparation Ulcer Cleansing: Other: soap and water, Topical Anesthetic Applied: Other: lidocaine4%, Treatment Notes Wound #1 (Right, Medial Lower Leg) 1. Cleansed  with: Clean wound with Normal Saline 2. Anesthetic Topical Lidocaine 4% cream to wound bed prior to debridement 4. Dressing Applied: Steinborn, Alsha C. ([id number]) Aquacel Ag 5. Secondary Dressing Applied ABD Pad 7. Secured with 2 Layer Compression System - Right Lower Extremity Notes Drawtex, Mepilex lite on heel and ankle Electronic Signature(s) Signed: 11/06/2014  3:55:58 PM By: Gretta Cool, RN, BSN, Kim RN, BSN Entered By: Gretta Cool, RN, BSN, Kim on 11/06/2014 14:06:52 Vanriper, [name] -------------------------------------------------------------------------------- [place] Details Patient Name: Andress, Nico C. Date of Service: 11/06/2014 1:45 PM Medical Record Number: [id number] Patient Account Number: [id number] Date of Birth/Sex: 08/15/1927 (81 y.o. Female) Treating RN: Cornell Barman Primary Care Physician: Ria Bush Other Clinician: Referring Physician: Ria Bush Treating Physician/Extender: Frann Rider in Treatment: 11 Vital Signs Time Taken: 13:59 Pulse (bpm): 76 Height (in): 58 Respiratory Rate (breaths/min): 18 Weight (lbs): 151 Blood Pressure (mmHg): 118/60 Body Mass Index (BMI): 31.6 Reference Range: 80 - 120 mg / dl Electronic Signature(s) Signed: 11/06/2014 3:55:58 PM By: Gretta Cool, RN, BSN, Kim RN, BSN Entered By: Gretta Cool, RN, BSN, Kim on 11/06/2014 13:59:34

## 2014-11-12 ENCOUNTER — Ambulatory Visit (INDEPENDENT_AMBULATORY_CARE_PROVIDER_SITE_OTHER): Payer: Medicare Other | Admitting: Family Medicine

## 2014-11-12 ENCOUNTER — Encounter: Payer: Self-pay | Admitting: Family Medicine

## 2014-11-12 VITALS — BP 126/70 | HR 82 | Temp 97.8°F | Wt 153.0 lb

## 2014-11-12 DIAGNOSIS — E1140 Type 2 diabetes mellitus with diabetic neuropathy, unspecified: Secondary | ICD-10-CM | POA: Diagnosis not present

## 2014-11-12 DIAGNOSIS — L200 Besnier's prurigo: Secondary | ICD-10-CM | POA: Diagnosis not present

## 2014-11-12 DIAGNOSIS — L97919 Non-pressure chronic ulcer of unspecified part of right lower leg with unspecified severity: Secondary | ICD-10-CM

## 2014-11-12 DIAGNOSIS — I87311 Chronic venous hypertension (idiopathic) with ulcer of right lower extremity: Secondary | ICD-10-CM

## 2014-11-12 DIAGNOSIS — I2510 Atherosclerotic heart disease of native coronary artery without angina pectoris: Secondary | ICD-10-CM | POA: Diagnosis not present

## 2014-11-12 DIAGNOSIS — I739 Peripheral vascular disease, unspecified: Secondary | ICD-10-CM | POA: Diagnosis not present

## 2014-11-12 DIAGNOSIS — I83019 Varicose veins of right lower extremity with ulcer of unspecified site: Secondary | ICD-10-CM

## 2014-11-12 DIAGNOSIS — E1149 Type 2 diabetes mellitus with other diabetic neurological complication: Secondary | ICD-10-CM

## 2014-11-12 DIAGNOSIS — L239 Allergic contact dermatitis, unspecified cause: Secondary | ICD-10-CM

## 2014-11-12 MED ORDER — TRIAMCINOLONE ACETONIDE 0.1 % EX CREA: 1.0000 "application " | TOPICAL_CREAM | Freq: Two times a day (BID) | CUTANEOUS | Status: DC

## 2014-11-12 NOTE — Patient Instructions (Signed)
Take either Benadryl or ATARAX for itching up to 4 times a day  Use the cream up to 3 times a day right now while it is itching.

## 2014-11-12 NOTE — Progress Notes (Signed)
Dr. Frederico Hamman T. Sanda Dejoy, MD, [facility] [place], [address] Phone: [phone] Fax: [phone]  11/12/2014  Patient: [name], MRN: [id number], DOB: 06-23-1927, 81 y.o.  Primary Physician:  Ria Bush, MD  Chief Complaint: Rash  Subjective:   [name] is a 81 y.o. very pleasant female patient who presents with the following:  Elderly patient who I remember well who presents with a diffuse maculopapular rash that is diffusely pruritic. She has not had any new lotions, soaps, or any, topical exposure that she can think of other than some exposure to plant material other earlier in the week.  History is significant for a long-standing right sided lower leg ulcer. She actually scheduled to have a skin graft tomorrow.  She also has diabetes.  Rash all over the place.   R ulcer - skin graft this week.   Lab Results  Component Value Date   HGBA1C 6.7* 10/31/2014    Past Medical History, Surgical History, Social History, Family History, Problem List, Medications, and Allergies have been reviewed and updated if relevant.  Patient Active Problem List   Diagnosis Date Noted  . PAD (peripheral artery disease)   . Chronic venous insufficiency   . Bleeding from varicose veins of right lower extremity 02/13/2014  . Open wound of left great toe 01/02/2014  . Foot pain, right 12/23/2013  . Venous ulcer of leg 12/12/2013  . Acute deep vein thrombosis (DVT) of distal vein of right lower extremity 12/03/2013  . Diabetic polyneuropathy 11/19/2013  . Type 2 diabetes mellitus with neurological manifestations, controlled 11/19/2013  . Right shoulder pain 08/19/2013  . Medicare annual wellness visit, subsequent 06/17/2013  . Sinus congestion 06/17/2013  . Cerumen impaction 04/16/2013  . Dysphagia 02/18/2013  . Dyspnea 01/22/2013  . Leg pain, bilateral 11/12/2012  . Abdominal pain, other specified site  05/14/2012  . Diarrhea 04/03/2012  . Peptic ulcer disease   . Hot flashes 12/13/2011  . Falls 12/13/2011  . Postmenopausal 09/14/2011  . Chronic lower back pain 09/14/2011  . Pedal edema 09/14/2011  . Skin rash 12/20/2010  . Microcytic anemia 09/06/2010  . Hyperlipidemia 09/06/2010  . Anxiety 03/15/2010  . Obesity 03/15/2010  . Hypertension 03/15/2010  . Diastolic dysfunction 0000000  . Coronary artery disease     Past Medical History  Diagnosis Date  . Hypertension   . Coronary artery disease     CABG 1998  . Anxiety   . Diastolic dysfunction   . Hyperlipidemia   . PUD (peptic ulcer disease)   . Microcytic anemia   . Depression   . Diabetes mellitus     type 2, followed by endo.  . Diverticulosis   . Hemorrhoids   . History of heart attack   . Irritable bowel syndrome   . Chronic back pain 03/2012    lumbar DDD with herniation - s/p L S1 transforaminal ESI (10/2012 Dr. Sharlet Salina)  . Varicose veins   . Cystocele   . Hx of echocardiogram     a. Echo 10/13: mild LVH, EF 55-60%, Gr 1 diast dysfn, PASP 32  . Acute deep vein thrombosis (DVT) of distal vein of right lower extremity 12/2013    Acute thrombus of R proximal gastrocnemius vein. Symptomatic provoked distal DVT  . Cellulitis of leg, left 01/16/2014  . PAD (peripheral artery disease) 2013    ABI: R 0.91, L 0.83  . Chronic venous insufficiency  Past Surgical History  Procedure Laterality Date  . Tympanoplasty  1960s    R side  . Coronary artery bypass graft  1998  . Cataract surgery    . Cholecystectomy    . Appendectomy      per pt report  . Colonoscopy  02/2010    extensive diverticulosis throughout, small internal hemorrhoids, rec rpt 5 yrs (Dr. Allyn Kenner)  . Hand surgery    . Lexiscan myoview  03/2011    Small basal inferolateral and anterolateral reversible perfusion defect suggests ischemia.  EF was normal.   old infarct, no new ischemia  . Toe surgery    . Abdominal hysterectomy  1975     TAH,BSO  . Vault prolapse a & p repair  2009    Dr [facility] hospital  . Dexa scan  09/2011    femur -0.8, forearm 0.6 => normal  . Esi  2014    L S1 transforaminal ESI x2 (Chasnis) without improvement  . Abi  2013    R 0.91, L 0.83  . Endovenous ablation saphenous vein w/ laser Left 03/2014    Kellie Simmering    Social History   Social History  . Marital Status: Single    Spouse Name: N/A  . Number of Children: N/A  . Years of Education: N/A   Occupational History  . Waitress     Social History Main Topics  . Smoking status: Former Smoker    Quit date: 03/05/1979  . Smokeless tobacco: Never Used  . Alcohol Use: No  . Drug Use: No  . Sexual Activity: No     Comment: 1st intercourse 81yo.--- 2 sexual partners   Other Topics Concern  . 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   Independent for all ADLs, lives with a boyfriend.      Family History  Problem Relation Age of Onset  . Hypertension Mother   . Stroke Mother   . Diabetes Father   . Diabetes Sister   . Diabetes Brother   . Colon cancer Neg Hx     Allergies  Allergen Reactions  . Amoxicillin-Pot Clavulanate Diarrhea  . Metformin And Related Diarrhea    Medication list reviewed and updated in full in [facility].   GEN: No acute illnesses, no fevers, chills. GI: No n/v/d, eating normally Pulm: No SOB Interactive and getting along well at home.  Otherwise, ROS is as per the HPI.  Objective:   BP 126/70 mmHg  Pulse 82  Temp(Src) 97.8 F (36.6 C) (Oral)  Wt 153 lb (69.4 kg)  SpO2 95%  GEN: WDWN, NAD, Non-toxic, A & O x 3 HEENT: Atraumatic, Normocephalic. Neck supple. No masses, No LAD. Ears and Nose: No external deformity. EXTR: No c/c/e NEURO Normal gait.  PSYCH: Normally interactive. Conversant. Not depressed or anxious appearing.  Calm demeanor.  Skin: The patient has raised areas multiple throughout her arms, neck, chest, back, abdomen, she  reports there also on her legs. Chaperoned by Margarite Gouge, CMA  Laboratory and Imaging Data:  Assessment and Plan:   Allergic dermatitis  Venous stasis ulcer of right lower extremity  Type 2 diabetes mellitus with neurological manifestations, controlled  PAD (peripheral artery disease)  More complicated in the setting of a complex ulcer with ongoing wound management and skin graft tomorrow.  Topical steroid-induced and anti-histamines. In this setting, I do not think that systemic steroids are appropriate.  Patient Instructions  Take either Benadryl or ATARAX for itching up to 4 times a day  Use the cream up to 3 times a day right now while it is itching.      New Prescriptions   TRIAMCINOLONE CREAM (KENALOG) 0.1 %    Apply 1 application topically 2 (two) times daily.   No orders of the defined types were placed in this encounter.    Signed,  Maud Deed. Eladia Frame, MD   Patient's Medications  New Prescriptions   TRIAMCINOLONE CREAM (KENALOG) 0.1 %    Apply 1 application topically 2 (two) times daily.  Previous Medications   ALPRAZOLAM (XANAX) 1 MG TABLET    Take 0.5 mg by mouth 4 (four) times daily. For anxiety   ASCORBIC ACID (VITAMIN C PO)    Take 1,000 mg by mouth daily.    ASPIRIN EC 81 MG TABLET    Take 81 mg by mouth daily.   CALCIUM PO    Take 600 mg by mouth daily.    CYANOCOBALAMIN (VITAMIN B 12 PO)    Take 1 tablet by mouth daily.   DEXILANT 30 MG CAPSULE    TAKE 1 CAPSULE BY MOUTH DAILY   FERROUS SULFATE 325 (65 FE) MG TABLET    Take 1 tablet (325 mg total) by mouth daily with breakfast.   FUROSEMIDE (LASIX) 20 MG TABLET    TAKE 1 TABLET (20 MG TOTAL) BY MOUTH DAILY. FOR LEG SWELLING   GLUCOSAMINE-CHONDROITIN 500-400 MG TABLET    Take 1 tablet by mouth daily.    GLUCOSE BLOOD (ONE TOUCH ULTRA TEST) TEST STRIP    Use as instructed to check blood sugar once a day dx code E11.40   HYDROCODONE-ACETAMINOPHEN (NORCO/VICODIN) 5-325 MG PER TABLET    Take 0.5-1  tablets by mouth every 6 (six) hours as needed for moderate pain.   LOPERAMIDE (IMODIUM A-D) 2 MG TABLET    Take 2 mg by mouth 4 (four) times daily as needed for diarrhea or loose stools.   NITROGLYCERIN (NITROSTAT) 0.4 MG SL TABLET    Place 0.4 mg under the tongue every 5 (five) minutes as needed. For chest pain May take up to 3 doses, if pain persists call 911.   NYSTATIN-TRIAMCINOLONE OINTMENT (MYCOLOG)    Apply 1 application topically 2 (two) times daily. As needed for itching   PIOGLITAZONE (ACTOS) 30 MG TABLET    Take 30 mg by mouth daily. For diabetes   PRAVASTATIN (PRAVACHOL) 20 MG TABLET    TAKE 1 TABLET BY MOUTH DAILY.   RAMIPRIL (ALTACE) 5 MG CAPSULE    TAKE 1 CAPSULE BY MOUTH EVERY DAY for blood pressure   ROPINIROLE (REQUIP) 1 MG TABLET    Take 1 mg by mouth 3 (three) times daily.    TRAZODONE (DESYREL) 150 MG TABLET    Take 150 mg by mouth at bedtime. For sleep   VITAMIN E PO    Take 1 tablet by mouth daily.   Modified Medications   No medications on file  Discontinued Medications   DOXYCYCLINE (VIBRAMYCIN) 100 MG CAPSULE    Take 100 mg by mouth 2 (two) times daily.   TRIAMCINOLONE CREAM (KENALOG) 0.1 %    Apply 1 application topically daily.

## 2014-11-12 NOTE — Progress Notes (Signed)
Pre visit review using our clinic review tool, if applicable. No additional management support is needed unless otherwise documented below in the visit note. 

## 2014-11-13 ENCOUNTER — Encounter: Payer: Medicare Other | Admitting: Surgery

## 2014-11-13 DIAGNOSIS — I87331 Chronic venous hypertension (idiopathic) with ulcer and inflammation of right lower extremity: Secondary | ICD-10-CM | POA: Diagnosis not present

## 2014-11-13 DIAGNOSIS — I10 Essential (primary) hypertension: Secondary | ICD-10-CM | POA: Diagnosis not present

## 2014-11-13 DIAGNOSIS — E11610 Type 2 diabetes mellitus with diabetic neuropathic arthropathy: Secondary | ICD-10-CM | POA: Diagnosis not present

## 2014-11-13 DIAGNOSIS — L03115 Cellulitis of right lower limb: Secondary | ICD-10-CM | POA: Diagnosis not present

## 2014-11-13 DIAGNOSIS — L97811 Non-pressure chronic ulcer of other part of right lower leg limited to breakdown of skin: Secondary | ICD-10-CM | POA: Diagnosis not present

## 2014-11-13 DIAGNOSIS — E11622 Type 2 diabetes mellitus with other skin ulcer: Secondary | ICD-10-CM | POA: Diagnosis not present

## 2014-11-13 DIAGNOSIS — L97211 Non-pressure chronic ulcer of right calf limited to breakdown of skin: Secondary | ICD-10-CM | POA: Diagnosis not present

## 2014-11-14 NOTE — Progress Notes (Signed)
CHESSIE, BENKERT ([id number]) Visit Report for 11/13/2014 Chief Complaint Document Details Patient Name: [name], [name] Date of Service: 11/13/2014 3:15 PM Medical Record Number: [id number] Patient Account Number: [id number] Date of Birth/Sex: [birth year] (81 y.o. Female) Treating RN: Montey Hora Primary Care Physician: Ria Bush Other Clinician: Referring Physician: Ria Bush Treating Physician/Extender: Frann Rider in Treatment: 12 Information Obtained from: Patient Chief Complaint Patient presents for treatment of an open ulcer due to venous insufficiency. 81 year old patient who is known to me from the [facility] wound care center now comes in follow up here for a chronic venous ulceration of her right lower extremity which she's had for about 6 months. Electronic Signature(s) Signed: 11/13/2014 4:07:37 PM By: Christin Fudge MD, FACS Entered By: Christin Fudge on 11/13/2014 16:07:36 Roszak, [name] ([id number]) -------------------------------------------------------------------------------- Cellular or Tissue Based Product Details Patient Name: [name], [name] Date of Service: 11/13/2014 3:15 PM Medical Record Number: [id number] Patient Account Number: [id number] Date of Birth/Sex: 1927-04-17 (81 y.o. Female) Treating RN: Montey Hora Primary Care Physician: Ria Bush Other Clinician: Referring Physician: Ria Bush Treating Physician/Extender: Frann Rider in Treatment: 12 Cellular or Tissue Based Wound #1 Right,Medial Lower Leg Product Type Applied to: Performed By: Physician Pat Patrick., MD Cellular or Tissue Based Apligraf Product Type: Time-Out Taken: Yes Location: trunk / arms / legs Wound Size (sq cm): 14.57 Product Size (sq cm): 44 Waste Size (sq cm): 32 Amount of Product Applied (sq cm): 12 Lot #: [id number] Expiration Date: 11/20/2014 Fenestrated: Yes Instrument: Blade Reconstituted: Yes Solution  Type: saline Solution Amount: 41ml Lot #: [id number] Solution Expiration 07/03/2016 Date: Secured: Yes Secured With: Steri-Strips Dressing Applied: Yes Primary Dressing: mepitel Procedural Pain: 0 Post Procedural Pain: 0 Response to Treatment: Procedure was tolerated well Electronic Signature(s) Signed: 11/13/2014 4:07:29 PM By: Christin Fudge MD, FACS Entered By: Christin Fudge on 11/13/2014 16:07:29 [name], [name] ([id number]) -------------------------------------------------------------------------------- HPI Details Patient Name: [name], [name] Date of Service: 11/13/2014 3:15 PM Medical Record Number: [id number] Patient Account Number: [id number] Date of Birth/Sex: September 10, 1927 (81 y.o. Female) Treating RN: Montey Hora Primary Care Physician: Ria Bush Other Clinician: Referring Physician: Ria Bush Treating Physician/Extender: Frann Rider in Treatment: 12 History of Present Illness Location: ulcer in the right lower extremity for several months Quality: Patient reports experiencing a dull pain to affected area(s). Severity: Patient states wound (s) are getting better. Duration: Patient has had the wound for > 3 months prior to seeking treatment at the wound center Timing: Pain in wound is Intermittent (comes and goes Context: The wound appeared gradually over time Modifying Factors: Consults to this date include: vascular surgery for the veins in December 2015 and she's been seeing the wound care center at [facility] for several months. Associated Signs and Symptoms: Patient reports having difficulty standing for long periods. HPI Description: This is an 81 yrs old female we have been following since the beginning of January for a wound on the right medial ankle. She had previously had venous ablation of the greater saphenous vein by Dr. Kellie Simmering. She came to [place] with erythema of the right leg and subsequently is opened in the medial aspect of the right  ankle. She has not been very compliant with compression. We ordered her at juxta lite stocking, however she apparently has not been able to wear it. The area on the medial aspect of her left ankle has much less induration and erythema. The edema is better controlled. No evidence of infection is noted. 07/18/14; no major  change in the condition of this wound. There is still surface slough and probably some drainage. 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 All other veins showed no evidence of DVT, superficial venous thrombosis or incompetence. however she does have manifestations of chronic venous hypertension and I believe we will change her dressing over to Prisma and a Profore light dressing. she sees her primary care doctor regularly and her last hemoglobin A1c has been around 7%.her other comorbidities are hypertension, CABG in 1998, peptic ulcer disease, history of echo with a ejection fraction of 55-60% and grade 1 diastolic dysfunction. past surgical history include coronary artery bypass graft in 1998, cholecystectomy, appendectomy, hand surgery, endovenous ablation of the saphenous vein with laser by Dr. Kellie Simmering in December 2015. 08/29/2014 - she complains of increased calf pain over the past week. removed her compression bandage and pain improved. no fever or chills. Mild clear drainage. 09/04/2014 -- she has not been compliant and has not done a dressing change as we had ordered last week. She always complains and does not keep her wrap on and this past week she has done the same. She has completed a course of doxycycline. 09/25/2014 -- she has tolerated her dressing all week and has not removed it. She says it's hurting a lot today and would not like any debridement. We will do so as per her wishes. she did get a appointment with the vascular surgeon and sees him  on 6/28. GRAYSON, HAMLET ([id number]) 10/03/2014 - She was seen by Dr. Tinnie Gens 09/30/2014. Assessment: I reviewed the previous venous duplex exam study performed in September 2015 which reveals no options for treating this venous ulcer. There is no significant superficial venous reflux to treat with laser ablation.Most recent ABI was 0.91 in the right foot with palpable dorsalis pedis pulse at 3+ Plan: patient needs to return to the [place] wound center and be treated by Dr. Con Memos. No arterial or venous options available from vascular standpoint or treatment of right leg stasis ulcer.Continued dressing changes and compression is the treatment of choice 10/16/2014 -- today will be her first application of Apligraf. 10/23/2014 -- we are going to be able to apply her second day of Apligraf next week. She is complaining of some tenderness in the region of Achilles tendon and this is possibly due to her wrap. 11/06/2014 -- she did not have Apligraf last week because the wound was not looking good but she says she is feeling much better now. 11/13/2014 -- she has been doing well this week and she is here for her second Apligraf. Electronic Signature(s) Signed: 11/13/2014 4:08:37 PM By: Christin Fudge MD, FACS Entered By: Christin Fudge on 11/13/2014 16:08:37 [name], [name] ([id number]) -------------------------------------------------------------------------------- Physical Exam Details Patient Name: [name], [name] Date of Service: 11/13/2014 3:15 PM Medical Record Number: [id number] Patient Account Number: [id number] Date of Birth/Sex: 08-04-1927 (81 y.o. Female) Treating RN: Montey Hora Primary Care Physician: Ria Bush Other Clinician: Referring Physician: Ria Bush Treating Physician/Extender: Frann Rider in Treatment: 12 Constitutional . Pulse regular. Respirations normal and unlabored. Afebrile. . Eyes Nonicteric. Reactive to light. Ears, Nose,  Mouth, and Throat Lips, teeth, and gums WNL.Marland Kitchen Moist mucosa without lesions . Neck supple and nontender. No palpable supraclavicular or cervical adenopathy. Normal sized without goiter. Respiratory WNL. No retractions.. Cardiovascular Pedal Pulses WNL. No clubbing, cyanosis or edema.  Chest Breasts symmetical and no nipple discharge.. Breast tissue WNL, no masses, lumps, or tenderness.. Lymphatic No adneopathy. No adenopathy. No adenopathy. Musculoskeletal Adexa without tenderness or enlargement.. Digits and nails w/o clubbing, cyanosis, infection, petechiae, ischemia, or inflammatory conditions.. Integumentary (Hair, Skin) No suspicious lesions. No crepitus or fluctuance. No peri-wound warmth or erythema. No masses.Marland Kitchen Psychiatric Judgement and insight Intact.. No evidence of depression, anxiety, or agitation.. Notes The wound looks very clean and healthy and they will use the next Apligraf today. Electronic Signature(s) Signed: 11/13/2014 4:09:12 PM By: Christin Fudge MD, FACS Entered By: Christin Fudge on 11/13/2014 16:09:12 [name], [name] -------------------------------------------------------------------------------- Physician Orders Details Patient Name: [name], [name] Date of Service: 11/13/2014 3:15 PM Medical Record Number: [id number] Patient Account Number: [id number] Date of Birth/Sex: 1928/01/20 (81 y.o. Female) Treating RN: Montey Hora Primary Care Physician: Ria Bush Other Clinician: Referring Physician: Ria Bush Treating Physician/Extender: Frann Rider in Treatment: 12 Verbal / Phone Orders: Yes Clinician: Montey Hora Read Back and Verified: Yes Diagnosis Coding Wound Cleansing Wound #1 Right,Medial Lower Leg o Cleanse wound with mild soap and water Anesthetic Wound #1 Right,Medial Lower Leg o Topical Lidocaine 4% cream applied to wound bed prior to debridement Primary Wound Dressing Wound #1 Right,Medial  Lower Leg o Other: - apligraf applied by Dr Con Memos Secondary Dressing Wound #1 Right,Medial Lower Leg o ABD pad o Drawtex Dressing Change Frequency Wound #1 Right,Medial Lower Leg o Change dressing every week Follow-up Appointments Wound #1 Right,Medial Lower Leg o Return Appointment in 1 week. Edema Control o 2 Layer Lite Compression System - Right Lower Extremity Additional Orders / Instructions Wound #1 Right,Medial Lower Leg o Increase protein intake. Advanced Therapies Wound #1 Right,Medial Lower Leg Breece, Nahara C. ([id number]) o Apligraf application in clinic; including contact layer, fixation with steri strips, dry gauze and cover dressing. Electronic Signature(s) Signed: 11/13/2014 4:11:41 PM By: Christin Fudge MD, FACS Signed: 11/13/2014 5:44:32 PM By: Montey Hora Entered By: Montey Hora on 11/13/2014 16:01:34 [name], [name] -------------------------------------------------------------------------------- Problem List Details Patient Name: [name], [name] Date of Service: 11/13/2014 3:15 PM Medical Record Number: [id number] Patient Account Number: [id number] Date of Birth/Sex: 04/06/1927 (81 y.o. Female) Treating RN: Montey Hora Primary Care Physician: Ria Bush Other Clinician: Referring Physician: Ria Bush Treating Physician/Extender: Frann Rider in Treatment: 12 Active Problems ICD-10 Encounter Code Description Active Date Diagnosis E11.622 Type 2 diabetes mellitus with other skin ulcer 08/15/2014 Yes E11.610 Type 2 diabetes mellitus with diabetic neuropathic 08/15/2014 Yes arthropathy I87.331 Chronic venous hypertension (idiopathic) with ulcer and 08/15/2014 Yes inflammation of right lower extremity L03.115 Cellulitis of right lower limb 08/29/2014 Yes Inactive Problems Resolved Problems Electronic Signature(s) Signed: 11/13/2014 4:07:10 PM By: Christin Fudge MD, FACS Entered By: Christin Fudge  on 11/13/2014 16:07:10 [name], [name] -------------------------------------------------------------------------------- Progress Note Details Patient Name: [name], [name] Date of Service: 11/13/2014 3:15 PM Medical Record Number: [id number] Patient Account Number: [id number] Date of Birth/Sex: 17-Oct-1927 (81 y.o. Female) Treating RN: Montey Hora Primary Care Physician: Ria Bush Other Clinician: Referring Physician: Ria Bush Treating Physician/Extender: Frann Rider in Treatment: 12 Subjective Chief Complaint Information obtained from Patient Patient presents for treatment of an open ulcer due to venous insufficiency. 81 year old patient who is known to me from the [facility] wound care center now comes in follow up here for a chronic venous ulceration of her right lower extremity which she's had for about 6 months. History of Present Illness (HPI) The following HPI elements were documented for the  patient's wound: Location: ulcer in the right lower extremity for several months Quality: Patient reports experiencing a dull pain to affected area(s). Severity: Patient states wound (s) are getting better. Duration: Patient has had the wound for > 3 months prior to seeking treatment at the wound center Timing: Pain in wound is Intermittent (comes and goes Context: The wound appeared gradually over time Modifying Factors: Consults to this date include: vascular surgery for the veins in December 2015 and she's been seeing the wound care center at [facility] for several months. Associated Signs and Symptoms: Patient reports having difficulty standing for long periods. This is an 81 yrs old female we have been following since the beginning of January for a wound on the right medial ankle. She had previously had venous ablation of the greater saphenous vein by Dr. Kellie Simmering. She came to [place] with erythema of the right leg and subsequently is opened in the  medial aspect of the right ankle. She has not been very compliant with compression. We ordered her at juxta lite stocking, however she apparently has not been able to wear it. The area on the medial aspect of her left ankle has much less induration and erythema. The edema is better controlled. No evidence of infection is noted. 07/18/14; no major change in the condition of this wound. There is still surface slough and probably some drainage. 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 All other veins showed no evidence of DVT, superficial venous thrombosis or incompetence. however she does have manifestations of chronic venous hypertension and I believe we will change her dressing over to Prisma and a Profore light dressing. she sees her primary care doctor regularly and her last hemoglobin A1c has been around 7%.her other comorbidities are hypertension, CABG in 1998, peptic ulcer disease, history of echo with a ejection fraction of 55-60% and grade 1 diastolic dysfunction. past surgical history include coronary artery bypass graft in 1998, cholecystectomy, appendectomy, hand surgery, endovenous ablation of the saphenous vein with laser by Dr. Kellie Simmering in December 2015. [name], KAUFER ([id number]) 08/29/2014 - she complains of increased calf pain over the past week. removed her compression bandage and pain improved. no fever or chills. Mild clear drainage. 09/04/2014 -- she has not been compliant and has not done a dressing change as we had ordered last week. She always complains and does not keep her wrap on and this past week she has done the same. She has completed a course of doxycycline. 09/25/2014 -- she has tolerated her dressing all week and has not removed it. She says it's hurting a lot today and would not like any debridement. We will do so as per her wishes. she  did get a appointment with the vascular surgeon and sees him on 6/28. 10/03/2014 - She was seen by Dr. Tinnie Gens 09/30/2014. Assessment: I reviewed the previous venous duplex exam study performed in September 2015 which reveals no options for treating this venous ulcer. There is no significant superficial venous reflux to treat with laser ablation.Most recent ABI was 0.91 in the right foot with palpable dorsalis pedis pulse at 3+ Plan: patient needs to return to the [place] wound center and be treated by Dr. Con Memos. No arterial or venous options available from vascular standpoint or treatment of right leg stasis ulcer.Continued dressing changes and compression is the treatment of choice 10/16/2014 --  today will be her first application of Apligraf. 10/23/2014 -- we are going to be able to apply her second day of Apligraf next week. She is complaining of some tenderness in the region of Achilles tendon and this is possibly due to her wrap. 11/06/2014 -- she did not have Apligraf last week because the wound was not looking good but she says she is feeling much better now. 11/13/2014 -- she has been doing well this week and she is here for her second Apligraf. Objective Constitutional Pulse regular. Respirations normal and unlabored. Afebrile. Vitals Time Taken: 3:35 PM, Height: 58 in, Weight: 151 lbs, BMI: 31.6, Pulse: 67 bpm, Respiratory Rate: 18 breaths/min, Blood Pressure: 127/59 mmHg. Eyes Nonicteric. Reactive to light. Ears, Nose, Mouth, and Throat Lips, teeth, and gums WNL.Marland Kitchen Moist mucosa without lesions . Neck supple and nontender. No palpable supraclavicular or cervical adenopathy. Normal sized without goiter. Respiratory Goodall, Salote C. ([id number]) WNL. No retractions.. Cardiovascular Pedal Pulses WNL. No clubbing, cyanosis or edema. Chest Breasts symmetical and no nipple discharge.. Breast tissue WNL, no masses, lumps, or tenderness.. Lymphatic No adneopathy. No  adenopathy. No adenopathy. Musculoskeletal Adexa without tenderness or enlargement.. Digits and nails w/o clubbing, cyanosis, infection, petechiae, ischemia, or inflammatory conditions.Marland Kitchen Psychiatric Judgement and insight Intact.. No evidence of depression, anxiety, or agitation.. General Notes: The wound looks very clean and healthy and they will use the next Apligraf today. Integumentary (Hair, Skin) No suspicious lesions. No crepitus or fluctuance. No peri-wound warmth or erythema. No masses.. Wound #1 status is Open. Original cause of wound was Gradually Appeared. The wound is located on the Right,Medial Lower Leg. The wound measures 4.7cm length x 3.1cm width x 0.1cm depth; 11.443cm^2 area and 1.144cm^3 volume. The wound is limited to skin breakdown. There is no tunneling or undermining noted. There is a large amount of serosanguineous drainage noted. The wound margin is flat and intact. There is large (67-100%) red, pink granulation within the wound bed. There is a small (1-33%) amount of necrotic tissue within the wound bed including Adherent Slough. The periwound skin appearance exhibited: Excoriation, Maceration, Moist, Erythema. The periwound skin appearance did not exhibit: Callus, Crepitus, Fluctuance, Friable, Induration, Localized Edema, Rash, Scarring, Dry/Scaly, Atrophie Blanche, Cyanosis, Ecchymosis, Hemosiderin Staining, Mottled, Pallor, Rubor. The surrounding wound skin color is noted with erythema which is circumferential. Periwound temperature was noted as No Abnormality. The periwound has tenderness on palpation. Assessment Active Problems ICD-10 E11.622 - Type 2 diabetes mellitus with other skin ulcer E11.610 - Type 2 diabetes mellitus with diabetic neuropathic arthropathy I87.331 - Chronic venous hypertension (idiopathic) with ulcer and inflammation of right lower extremity L03.115 - Cellulitis of right lower limb Dacy, Ernestyne C. ([id number]) With the usual  precautions the second application of Apligraf was used today. She has had it fixed with Steri-Strips and we will continue with a 2 layer compression system. She will come back next week for a wound check. Procedures Wound #1 Wound #1 is a Venous Leg Ulcer located on the Right,Medial Lower Leg. A skin graft procedure using a bioengineered skin substitute/cellular or tissue based product was performed by Spring San, Jackson Latino., MD. Apligraf was applied and secured with Steri-Strips. 12 sq cm of product was utilized and 32 sq cm was wasted. Post Application, mepitel was applied. A Time Out was conducted prior to the start of the procedure. The procedure was tolerated well with a pain level of 0 throughout and a pain level of 0 following the procedure. Plan Wound Cleansing: Wound #1  Right,Medial Lower Leg: Cleanse wound with mild soap and water Anesthetic: Wound #1 Right,Medial Lower Leg: Topical Lidocaine 4% cream applied to wound bed prior to debridement Primary Wound Dressing: Wound #1 Right,Medial Lower Leg: Other: - apligraf applied by Dr Con Memos Secondary Dressing: Wound #1 Right,Medial Lower Leg: ABD pad Drawtex Dressing Change Frequency: Wound #1 Right,Medial Lower Leg: Change dressing every week Follow-up Appointments: Wound #1 Right,Medial Lower Leg: Return Appointment in 1 week. Edema Control: 2 Layer Lite Compression System - Right Lower Extremity Additional Orders / Instructions: Wound #1 Right,Medial Lower Leg: Increase protein intake. Advanced Therapies: Wound #1 Right,Medial Lower Leg: Apligraf application in clinic; including contact layer, fixation with steri strips, dry gauze and cover dressing. Thrun, Shekina C. ([id number]) With the usual precautions the second application of Apligraf was used today. She has had it fixed with Steri-Strips and we will continue with a 2 layer compression system. She will come back next week for a wound check. Electronic  Signature(s) Signed: 11/13/2014 4:10:23 PM By: Christin Fudge MD, FACS Entered By: Christin Fudge on 11/13/2014 16:10:23 Syfert, [name] ([id number]) -------------------------------------------------------------------------------- SuperBill Details Patient Name: Gallant, Audery C. Date of Service: 11/13/2014 Medical Record Number: [id number] Patient Account Number: [id number] Date of Birth/Sex: 1928-03-30 (81 y.o. Female) Treating RN: Montey Hora Primary Care Physician: Ria Bush Other Clinician: Referring Physician: Ria Bush Treating Physician/Extender: Frann Rider in Treatment: 12 Diagnosis Coding ICD-10 Codes Code Description E11.622 Type 2 diabetes mellitus with other skin ulcer E11.610 Type 2 diabetes mellitus with diabetic neuropathic arthropathy Chronic venous hypertension (idiopathic) with ulcer and inflammation of right lower I87.331 extremity L03.115 Cellulitis of right lower limb Facility Procedures CPT4: Description Modifier Quantity Code [id number] (Facility Use Only) Apligraf 1 SQ CM 32 CPT4: [id number] - SKIN SUB GRAFT TRNK/ARM/LEG 1 ICD-10 Description Diagnosis E11.622 Type 2 diabetes mellitus with other skin ulcer I87.331 Chronic venous hypertension (idiopathic) with ulcer and inflammation of right lower extremity E11.610 Type 2  diabetes mellitus with diabetic neuropathic arthropathy Physician Procedures CPT4: Description Modifier Quantity Code [id number] - WC PHYS SKIN SUB GRAFT TRNK/ARM/LEG 1 ICD-10 Description Diagnosis E11.622 Type 2 diabetes mellitus with other skin ulcer I87.331 Chronic venous hypertension (idiopathic) with ulcer and  inflammation of right lower extremity E11.610 Type 2 diabetes mellitus with diabetic neuropathic arthropathy Electronic Signature(s) Signed: 11/13/2014 4:10:45 PM By: Christin Fudge MD, FACS Albergo, [name] ([id number]) Entered By: Christin Fudge on 11/13/2014 16:10:44

## 2014-11-14 NOTE — Progress Notes (Signed)
DAWAN, BETRO ([id number]) Visit Report for 11/13/2014 Arrival Information Details Patient Name: Ziebell, Torre C. Date of Service: 11/13/2014 3:15 PM Medical Record Number: [id number] Patient Account Number: [id number] Date of Birth/Sex: [birth year] (81 y.o. Female) Treating RN: Montey Hora Primary Care Physician: Ria Bush Other Clinician: Referring Physician: Ria Bush Treating Physician/Extender: Frann Rider in Treatment: 12 Visit Information History Since Last Visit Added or deleted any medications: No Patient Arrived: Walker Any new allergies or adverse reactions: No Arrival Time: 15:34 Had a fall or experienced change in No Accompanied By: self activities of daily living that may affect Transfer Assistance: None risk of falls: Patient Identification Verified: Yes Signs or symptoms of abuse/neglect since last No Secondary Verification Process Yes visito Completed: Hospitalized since last visit: No Patient Has Alerts: Yes Pain Present Now: No Patient Alerts: Patient on Blood Thinner Type II Diabetic 81 MG aspirin Electronic Signature(s) Signed: 11/13/2014 5:44:32 PM By: Montey Hora Entered By: Montey Hora on 11/13/2014 15:34:44 Schlagel, [name] ([id number]) -------------------------------------------------------------------------------- Encounter Discharge Information Details Patient Name: Flamenco, Raina C. Date of Service: 11/13/2014 3:15 PM Medical Record Number: [id number] Patient Account Number: [id number] Date of Birth/Sex: 03-Nov-1927 (81 y.o. Female) Treating RN: Montey Hora Primary Care Physician: Ria Bush Other Clinician: Referring Physician: Ria Bush Treating Physician/Extender: Frann Rider in Treatment: 12 Encounter Discharge Information Items Discharge Pain Level: 0 Discharge Condition: Stable Ambulatory Status: Walker Discharge Destination: Home Transportation: Private Auto Accompanied  By: self Schedule Follow-up Appointment: Yes Medication Reconciliation completed No and provided to Patient/Care Parnika Tweten: Provided on Clinical Summary of Care: 11/13/2014 Form Type Recipient Paper Patient LL Electronic Signature(s) Signed: 11/13/2014 4:19:29 PM By: Ruthine Dose Entered By: Ruthine Dose on 11/13/2014 16:19:29 Theil, [name] ([id number]) -------------------------------------------------------------------------------- Lower Extremity Assessment Details Patient Name: Katzman, Eldean C. Date of Service: 11/13/2014 3:15 PM Medical Record Number: [id number] Patient Account Number: [id number] Date of Birth/Sex: 1928-03-31 (81 y.o. Female) Treating RN: Montey Hora Primary Care Physician: Ria Bush Other Clinician: Referring Physician: Ria Bush Treating Physician/Extender: Frann Rider in Treatment: 12 Edema Assessment Assessed: [Left: No] [Right: No] E[Left: dema] [Right: :] Calf Left: Right: Point of Measurement: 27 cm From Medial Instep cm 26.5 cm Ankle Left: Right: Point of Measurement: 10 cm From Medial Instep cm 19.8 cm Vascular Assessment Pulses: Posterior Tibial Palpable: [Right:Yes] Dorsalis Pedis Palpable: [Right:Yes] Extremity colors, hair growth, and conditions: Extremity Color: [Right:Mottled] Hair Growth on Extremity: [Right:No] Temperature of Extremity: [Right:Warm] Capillary Refill: [Right:< 3 seconds] Toe Nail Assessment Left: Right: Thick: Yes Discolored: Yes Deformed: Yes Improper Length and Hygiene: No Electronic Signature(s) Signed: 11/13/2014 5:44:32 PM By: Montey Hora Entered By: Montey Hora on 11/13/2014 15:48:08 Dimercurio, [name] ([id number]) Emmanuel, Julissa Loletha Grayer ([id number]) -------------------------------------------------------------------------------- Multi Wound Chart Details Patient Name: Meek, Audre C. Date of Service: 11/13/2014 3:15 PM Medical Record Number: [id number] Patient Account  Number: [id number] Date of Birth/Sex: 1928/03/23 (81 y.o. Female) Treating RN: Montey Hora Primary Care Physician: Ria Bush Other Clinician: Referring Physician: Ria Bush Treating Physician/Extender: Frann Rider in Treatment: 12 Vital Signs Height(in): 58 Pulse(bpm): 67 Weight(lbs): 151 Blood Pressure 127/59 (mmHg): Body Mass Index(BMI): 32 Temperature(F): Respiratory Rate 18 (breaths/min): Photos: [1:No Photos] [N/A:N/A] Wound Location: [1:Right Lower Leg - Medial] [N/A:N/A] Wounding Event: [1:Gradually Appeared] [N/A:N/A] Primary Etiology: [1:Venous Leg Ulcer] [N/A:N/A] Comorbid History: [1:Cataracts, Anemia, Coronary Artery Disease, Hypertension, Myocardial Infarction, Type II Diabetes, Osteoarthritis] [N/A:N/A] Date Acquired: [1:04/04/2014] [N/A:N/A] Weeks of Treatment: [1:12] [N/A:N/A] Wound Status: [1:Open] [N/A:N/A] Measurements L x W x D 4.7x3.1x0.1 [N/A:N/A] (cm) Area (  cm) : [1:11.443] [N/A:N/A] Volume (cm) : [1:1.144] [N/A:N/A] % Reduction in Area: [1:-94.30%] [N/A:N/A] % Reduction in Volume: 35.30% [N/A:N/A] Classification: [1:Full Thickness Without Exposed Support Structures] [N/A:N/A] HBO Classification: [1:Grade 1] [N/A:N/A] Exudate Amount: [1:Large] [N/A:N/A] Exudate Type: [1:Serosanguineous] [N/A:N/A] Exudate Color: [1:red, brown] [N/A:N/A] Wound Margin: [1:Flat and Intact] [N/A:N/A] Granulation Amount: [1:Large (67-100%)] [N/A:N/A] Granulation Quality: [1:Red, Pink, Hyper- granulation] [N/A:N/A] Necrotic Amount: [1:Small (1-33%)] [N/A:N/A] Exposed Structures: Fascia: No N/A N/A Fat: No Tendon: No Muscle: No Joint: No Bone: No Limited to Skin Breakdown Epithelialization: Small (1-33%) N/A N/A Periwound Skin Texture: Excoriation: Yes N/A N/A Edema: No Induration: No Callus: No Crepitus: No Fluctuance: No Friable: No Rash: No Scarring: No Periwound Skin Maceration: Yes N/A N/A Moisture: Moist: Yes Dry/Scaly:  No Periwound Skin Color: Erythema: Yes N/A N/A Atrophie Blanche: No Cyanosis: No Ecchymosis: No Hemosiderin Staining: No Mottled: No Pallor: No Rubor: No Erythema Location: Circumferential N/A N/A Temperature: No Abnormality N/A N/A Tenderness on Yes N/A N/A Palpation: Wound Preparation: Ulcer Cleansing: Other: N/A N/A soap and water Topical Anesthetic Applied: Other: lidocaine4% Treatment Notes Electronic Signature(s) Signed: 11/13/2014 5:44:32 PM By: Montey Hora Entered By: Montey Hora on 11/13/2014 15:48:39 Perfetti, [name] ([id number]) -------------------------------------------------------------------------------- [place] Details Patient Name: Janvier, Amyah C. Date of Service: 11/13/2014 3:15 PM Medical Record Number: [id number] Patient Account Number: [id number] Date of Birth/Sex: 1928-03-28 (81 y.o. Female) Treating RN: Montey Hora Primary Care Physician: Ria Bush Other Clinician: Referring Physician: Ria Bush Treating Physician/Extender: Frann Rider in Treatment: 12 Active Inactive Abuse / Safety / Falls / Self Care Management Nursing Diagnoses: Potential for falls Goals: Patient will remain injury free Date Initiated: 08/15/2014 Goal Status: Active Interventions: Assess fall risk on admission and as needed Notes: Nutrition Nursing Diagnoses: Impaired glucose control: actual or potential Goals: Patient/caregiver verbalizes understanding of need to maintain therapeutic glucose control per primary care physician Date Initiated: 08/15/2014 Goal Status: Active Interventions: Assess patient nutrition upon admission and as needed per policy Notes: Orientation to the Wound Care Program Nursing Diagnoses: Knowledge deficit related to the wound healing center program Goals: Patient/caregiver will verbalize understanding of the Timberlake, Tearah C. ([id number]) Date Initiated:  08/15/2014 Goal Status: Active Interventions: Provide education on orientation to the wound center Notes: Venous Leg Ulcer Nursing Diagnoses: Actual venous Insuffiency (use after diagnosis is confirmed) Goals: Patient will maintain optimal edema control Date Initiated: 08/15/2014 Goal Status: Active Interventions: Compression as ordered Treatment Activities: Test ordered outside of clinic : 11/13/2014 Notes: Wound/Skin Impairment Nursing Diagnoses: Impaired tissue integrity Goals: Patient/caregiver will verbalize understanding of skin care regimen Date Initiated: 08/15/2014 Goal Status: Active Interventions: Assess patient/caregiver ability to obtain necessary supplies Notes: Electronic Signature(s) Signed: 11/13/2014 5:44:32 PM By: Montey Hora Entered By: Montey Hora on 11/13/2014 15:48:33 Seebeck, [name] ([id number]) -------------------------------------------------------------------------------- Patient/Caregiver Education Details Patient Name: Fullman, Talina C. Date of Service: 11/13/2014 3:15 PM Medical Record Number: [id number] Patient Account Number: [id number] Date of Birth/Gender: [birth year] (81 y.o. Female) Treating RN: Montey Hora Primary Care Physician: Ria Bush Other Clinician: Referring Physician: Ria Bush Treating Physician/Extender: Frann Rider in Treatment: 12 Education Assessment Education Provided To: Patient Education Topics Provided Wound/Skin Impairment: Handouts: Other: skin substitute Methods: Explain/Verbal Responses: State content correctly Electronic Signature(s) Signed: 11/13/2014 5:44:32 PM By: Montey Hora Entered By: Montey Hora on 11/13/2014 16:15:29 Sporer, [name] ([id number]) -------------------------------------------------------------------------------- Wound Assessment Details Patient Name: Ditmore, Oletha C. Date of Service: 11/13/2014 3:15 PM Medical Record Number: [id number] Patient  Account Number: [id number] Date of  Birth/Sex: [birth year] (81 y.o. Female) Treating RN: Montey Hora Primary Care Physician: Ria Bush Other Clinician: Referring Physician: Ria Bush Treating Physician/Extender: Frann Rider in Treatment: 12 Wound Status Wound Number: 1 Primary Venous Leg Ulcer Etiology: Wound Location: Right Lower Leg - Medial Wound Open Wounding Event: Gradually Appeared Status: Date Acquired: 04/04/2014 Comorbid Cataracts, Anemia, Coronary Artery Weeks Of Treatment: 12 History: Disease, Hypertension, Myocardial Clustered Wound: No Infarction, Type II Diabetes, Osteoarthritis Photos Photo Uploaded By: Montey Hora on 11/13/2014 17:39:06 Wound Measurements Length: (cm) 4.7 Width: (cm) 3.1 Depth: (cm) 0.1 Area: (cm) 11.443 Volume: (cm) 1.144 % Reduction in Area: -94.3% % Reduction in Volume: 35.3% Epithelialization: Small (1-33%) Tunneling: No Undermining: No Wound Description Full Thickness Without Exposed Foul Odor A Classification: Support Structures Diabetic Severity Grade 1 (Wagner): Wound Margin: Flat and Intact Exudate Amount: Large Exudate Type: Serosanguineous Exudate Color: red, brown fter Cleansing: No Wound Bed Indelicato, Tanveer C. ([id number]) Granulation Amount: Large (67-100%) Exposed Structure Granulation Quality: Red, Pink, Hyper-granulation Fascia Exposed: No Necrotic Amount: Small (1-33%) Fat Layer Exposed: No Necrotic Quality: Adherent Slough Tendon Exposed: No Muscle Exposed: No Joint Exposed: No Bone Exposed: No Limited to Skin Breakdown Periwound Skin Texture Texture Color No Abnormalities Noted: No No Abnormalities Noted: No Callus: No Atrophie Blanche: No Crepitus: No Cyanosis: No Excoriation: Yes Ecchymosis: No Fluctuance: No Erythema: Yes Friable: No Erythema Location: Circumferential Induration: No Hemosiderin Staining: No Localized Edema: No Mottled: No Rash: No Pallor:  No Scarring: No Rubor: No Moisture Temperature / Pain No Abnormalities Noted: No Temperature: No Abnormality Dry / Scaly: No Tenderness on Palpation: Yes Maceration: Yes Moist: Yes Wound Preparation Ulcer Cleansing: Other: soap and water, Topical Anesthetic Applied: Other: lidocaine4%, Treatment Notes Wound #1 (Right, Medial Lower Leg) 1. Cleansed with: Cleanse wound with antibacterial soap and water 2. Anesthetic Topical Lidocaine 4% cream to wound bed prior to debridement 4. Dressing Applied: Other dressing (specify in notes) 5. Secondary Dressing Applied Dry Gauze 7. Secured with Tape 2 Layer Lite Compression System - Right Lower Extremity Notes apligraf applied by Dr Con Memos covered with mepitel one and steri strips, Drawtex, xtrasorb EMMELY, LAINO ([id number]) Electronic Signature(s) Signed: 11/13/2014 5:44:32 PM By: Montey Hora Entered By: Montey Hora on 11/13/2014 15:48:25 Renzulli, [name] ([id number]) -------------------------------------------------------------------------------- Vitals Details Patient Name: Diekman, Shaletta C. Date of Service: 11/13/2014 3:15 PM Medical Record Number: [id number] Patient Account Number: [id number] Date of Birth/Sex: [birth year] (81 y.o. Female) Treating RN: Montey Hora Primary Care Physician: Ria Bush Other Clinician: Referring Physician: Ria Bush Treating Physician/Extender: Frann Rider in Treatment: 12 Vital Signs Time Taken: 15:35 Pulse (bpm): 67 Height (in): 58 Respiratory Rate (breaths/min): 18 Weight (lbs): 151 Blood Pressure (mmHg): 127/59 Body Mass Index (BMI): 31.6 Reference Range: 80 - 120 mg / dl Electronic Signature(s) Signed: 11/13/2014 5:44:32 PM By: Montey Hora Entered By: Montey Hora on 11/13/2014 15:36:03

## 2014-11-17 ENCOUNTER — Ambulatory Visit: Payer: Medicare Other | Admitting: Family Medicine

## 2014-11-17 ENCOUNTER — Other Ambulatory Visit: Payer: Self-pay | Admitting: Family Medicine

## 2014-11-21 ENCOUNTER — Encounter: Payer: Medicare Other | Admitting: Surgery

## 2014-11-21 DIAGNOSIS — I87331 Chronic venous hypertension (idiopathic) with ulcer and inflammation of right lower extremity: Secondary | ICD-10-CM | POA: Diagnosis not present

## 2014-11-21 DIAGNOSIS — E11622 Type 2 diabetes mellitus with other skin ulcer: Secondary | ICD-10-CM | POA: Diagnosis not present

## 2014-11-21 DIAGNOSIS — E11610 Type 2 diabetes mellitus with diabetic neuropathic arthropathy: Secondary | ICD-10-CM | POA: Diagnosis not present

## 2014-11-21 DIAGNOSIS — I10 Essential (primary) hypertension: Secondary | ICD-10-CM | POA: Diagnosis not present

## 2014-11-21 DIAGNOSIS — L97211 Non-pressure chronic ulcer of right calf limited to breakdown of skin: Secondary | ICD-10-CM | POA: Diagnosis not present

## 2014-11-21 DIAGNOSIS — L03115 Cellulitis of right lower limb: Secondary | ICD-10-CM | POA: Diagnosis not present

## 2014-11-22 NOTE — Progress Notes (Signed)
MAKAELA, PLUNK ([id number]) Visit Report for 11/21/2014 Arrival Information Details Patient Name: Settles, Pheonix C. Date of Service: 11/21/2014 1:45 PM Medical Record Number: [id number] Patient Account Number: [id number] Date of Birth/Sex: 04-22-1927 (81 y.o. Female) Treating RN: Cornell Barman Primary Care Physician: Ria Bush Other Clinician: Referring Physician: Ria Bush Treating Physician/Extender: Frann Rider in Treatment: 49 Visit Information History Since Last Visit Added or deleted any medications: No Patient Arrived: Ambulatory Any new allergies or adverse reactions: No Arrival Time: 13:55 Had a fall or experienced change in No Accompanied By: self activities of daily living that may affect Transfer Assistance: None risk of falls: Patient Identification Verified: Yes Signs or symptoms of abuse/neglect since last No Secondary Verification Process Yes visito Completed: Hospitalized since last visit: No Patient Has Alerts: Yes Has Dressing in Place as Prescribed: Yes Patient Alerts: Patient on Blood Has Compression in Place as Prescribed: Yes Thinner Pain Present Now: No Type II Diabetic 81 MG aspirin Electronic Signature(s) Signed: 11/21/2014 5:07:03 PM By: Gretta Cool, RN, BSN, Kim RN, BSN Entered By: Gretta Cool, RN, BSN, Kim on 11/21/2014 13:55:49 Lieber, [name] ([id number]) -------------------------------------------------------------------------------- Encounter Discharge Information Details Patient Name: Lucy, Takeshia C. Date of Service: 11/21/2014 1:45 PM Medical Record Number: [id number] Patient Account Number: [id number] Date of Birth/Sex: Sep 30, 1927 (81 y.o. Female) Treating RN: Cornell Barman Primary Care Physician: Ria Bush Other Clinician: Referring Physician: Ria Bush Treating Physician/Extender: Frann Rider in Treatment: 14 Encounter Discharge Information Items Discharge Pain Level: 0 Discharge Condition:  Stable Ambulatory Status: Walker Discharge Destination: Home Private Transportation: Auto Accompanied By: self Schedule Follow-up Appointment: Yes Medication Reconciliation completed and Yes provided to Patient/Care [place]: Patient Clinical Summary of Care: Declined Electronic Signature(s) Signed: 11/21/2014 2:30:30 PM By: Ruthine Dose Entered By: Ruthine Dose on 11/21/2014 14:30:30 Soucek, [name] ([id number]) -------------------------------------------------------------------------------- Lower Extremity Assessment Details Patient Name: Springer, Kirsty C. Date of Service: 11/21/2014 1:45 PM Medical Record Number: [id number] Patient Account Number: [id number] Date of Birth/Sex: Aug 31, 1927 (81 y.o. Female) Treating RN: Cornell Barman Primary Care Physician: Ria Bush Other Clinician: Referring Physician: Ria Bush Treating Physician/Extender: Frann Rider in Treatment: 14 Edema Assessment Assessed: [Left: No] [Right: No] E[Left: dema] [Right: :] Calf Left: Right: Point of Measurement: 27 cm From Medial Instep cm 26.6 cm Ankle Left: Right: Point of Measurement: 10 cm From Medial Instep cm 21.5 cm Vascular Assessment Pulses: Posterior Tibial Palpable: [Right:Yes] Dorsalis Pedis Palpable: [Right:Yes] Extremity colors, hair growth, and conditions: Extremity Color: [Right:Mottled] Hair Growth on Extremity: [Right:No] Temperature of Extremity: [Right:Warm] Capillary Refill: [Right:< 3 seconds] Toe Nail Assessment Left: Right: Thick: No Discolored: No Deformed: No Improper Length and Hygiene: No Electronic Signature(s) Signed: 11/21/2014 5:07:03 PM By: Gretta Cool, RN, BSN, Kim RN, BSN Entered By: Gretta Cool, RN, BSN, Kim on 11/21/2014 14:01:17 Habeeb, [name] ([id number]) Dye, Kimberlea Loletha Grayer ([id number]) -------------------------------------------------------------------------------- Multi Wound Chart Details Patient Name: Mcguffin, Triana C. Date of  Service: 11/21/2014 1:45 PM Medical Record Number: [id number] Patient Account Number: [id number] Date of Birth/Sex: 24-May-1927 (81 y.o. Female) Treating RN: Cornell Barman Primary Care Physician: Ria Bush Other Clinician: Referring Physician: Ria Bush Treating Physician/Extender: Frann Rider in Treatment: 14 Vital Signs Height(in): 58 Pulse(bpm): 75 Weight(lbs): 151 Blood Pressure 114/50 (mmHg): Body Mass Index(BMI): 32 Temperature(F): 98.2 Respiratory Rate 18 (breaths/min): Photos: [1:No Photos] [N/A:N/A] Wound Location: [1:Right Lower Leg - Medial] [N/A:N/A] Wounding Event: [1:Gradually Appeared] [N/A:N/A] Primary Etiology: [1:Venous Leg Ulcer] [N/A:N/A] Comorbid History: [1:Cataracts, Anemia, Coronary Artery Disease, Hypertension, Myocardial Infarction, Type II Diabetes, Osteoarthritis] [N/A:N/A] Date Acquired: [1:04/04/2014] [  N/A:N/A] Weeks of Treatment: [1:14] [N/A:N/A] Wound Status: [1:Open] [N/A:N/A] Measurements L x W x D 4.7x3.1x0.1 [N/A:N/A] (cm) Area (cm) : [1:11.443] [N/A:N/A] Volume (cm) : [1:1.144] [N/A:N/A] % Reduction in Area: [1:-94.30%] [N/A:N/A] % Reduction in Volume: 35.30% [N/A:N/A] Classification: [1:Full Thickness Without Exposed Support Structures] [N/A:N/A] HBO Classification: [1:Grade 1] [N/A:N/A] Exudate Amount: [1:Large] [N/A:N/A] Exudate Type: [1:Serosanguineous] [N/A:N/A] Exudate Color: [1:red, brown] [N/A:N/A] Wound Margin: [1:Flat and Intact] [N/A:N/A] Granulation Amount: [1:Large (67-100%)] [N/A:N/A] Granulation Quality: [1:Red, Pink, Hyper- granulation] [N/A:N/A] Necrotic Amount: [1:Small (1-33%)] [N/A:N/A] Exposed Structures: Fascia: No N/A N/A Fat: No Tendon: No Muscle: No Joint: No Bone: No Limited to Skin Breakdown Epithelialization: Small (1-33%) N/A N/A Periwound Skin Texture: Excoriation: Yes N/A N/A Edema: No Induration: No Callus: No Crepitus: No Fluctuance: No Friable: No Rash: No Scarring:  No Periwound Skin Maceration: Yes N/A N/A Moisture: Moist: Yes Dry/Scaly: No Periwound Skin Color: Erythema: Yes N/A N/A Atrophie Blanche: No Cyanosis: No Ecchymosis: No Hemosiderin Staining: No Mottled: No Pallor: No Rubor: No Erythema Location: Circumferential N/A N/A Temperature: No Abnormality N/A N/A Tenderness on Yes N/A N/A Palpation: Wound Preparation: Ulcer Cleansing: Other: N/A N/A soap and water Topical Anesthetic Applied: None, Other: lidocaine4% Assessment Notes: Apligraf applied last week, N/A N/A left cover dressing on. Cannot measure this week. Treatment Notes Electronic Signature(s) Signed: 11/21/2014 5:07:03 PM By: Gretta Cool, RN, BSN, Kim RN, BSN Entered By: Gretta Cool, RN, BSN, Kim on 11/21/2014 14:15:10 Helgeson, [name] ([id number]) SHIVANI, CALISTO ([id number]) -------------------------------------------------------------------------------- [place] Details Patient Name: Hatchel, Idabell C. Date of Service: 11/21/2014 1:45 PM Medical Record Number: [id number] Patient Account Number: [id number] Date of Birth/Sex: 07/23/1927 (81 y.o. Female) Treating RN: Cornell Barman Primary Care Physician: Ria Bush Other Clinician: Referring Physician: Ria Bush Treating Physician/Extender: Frann Rider in Treatment: 14 Active Inactive Abuse / Safety / Falls / Self Care Management Nursing Diagnoses: Potential for falls Goals: Patient will remain injury free Date Initiated: 08/15/2014 Goal Status: Active Interventions: Assess fall risk on admission and as needed Notes: Nutrition Nursing Diagnoses: Impaired glucose control: actual or potential Goals: Patient/caregiver verbalizes understanding of need to maintain therapeutic glucose control per primary care physician Date Initiated: 08/15/2014 Goal Status: Active Interventions: Assess patient nutrition upon admission and as needed per policy Notes: Orientation to the Wound  Care Program Nursing Diagnoses: Knowledge deficit related to the wound healing center program Goals: Patient/caregiver will verbalize understanding of the Wyndham, Marigene C. ([id number]) Date Initiated: 08/15/2014 Goal Status: Active Interventions: Provide education on orientation to the wound center Notes: Venous Leg Ulcer Nursing Diagnoses: Actual venous Insuffiency (use after diagnosis is confirmed) Goals: Patient will maintain optimal edema control Date Initiated: 08/15/2014 Goal Status: Active Interventions: Compression as ordered Treatment Activities: Test ordered outside of clinic : 11/21/2014 Notes: Wound/Skin Impairment Nursing Diagnoses: Impaired tissue integrity Goals: Patient/caregiver will verbalize understanding of skin care regimen Date Initiated: 08/15/2014 Goal Status: Active Interventions: Assess patient/caregiver ability to obtain necessary supplies Notes: Electronic Signature(s) Signed: 11/21/2014 5:07:03 PM By: Gretta Cool, RN, BSN, Kim RN, BSN Entered By: Gretta Cool, RN, BSN, Kim on 11/21/2014 14:15:03 Mura, [name] ([id number]) -------------------------------------------------------------------------------- Pain Assessment Details Patient Name: Gravlin, Devlin C. Date of Service: 11/21/2014 1:45 PM Medical Record Number: [id number] Patient Account Number: [id number] Date of Birth/Sex: [birth year] (81 y.o. Female) Treating RN: Cornell Barman Primary Care Physician: Ria Bush Other Clinician: Referring Physician: Ria Bush Treating Physician/Extender: Frann Rider in Treatment: 14 Active Problems Location of Pain Severity and Description of Pain Patient Has [name] No  Site Locations Pain Management and Medication Current Pain Management: Electronic Signature(s) Signed: 11/21/2014 5:07:03 PM By: Gretta Cool, RN, BSN, Kim RN, BSN Entered By: Gretta Cool, RN, BSN, Kim on 11/21/2014 13:55:54 Xia, [name]  ([id number]) -------------------------------------------------------------------------------- Patient/Caregiver Education Details Patient Name: Machia, Danett C. Date of Service: 11/21/2014 1:45 PM Medical Record Number: [id number] Patient Account Number: [id number] Date of Birth/Gender: 1927-10-20 (80 y.o. Female) Treating RN: Cornell Barman Primary Care Physician: Ria Bush Other Clinician: Referring Physician: Ria Bush Treating Physician/Extender: Frann Rider in Treatment: 14 Education Assessment Education Provided To: Patient Education Topics Provided Wound/Skin Impairment: Handouts: Caring for Your Ulcer, Other: continue wound care as prescribed Electronic Signature(s) Signed: 11/21/2014 5:07:03 PM By: Gretta Cool, RN, BSN, Kim RN, BSN Entered By: Gretta Cool, RN, BSN, Kim on 11/21/2014 14:18:30 Alvarez, [name] ([id number]) -------------------------------------------------------------------------------- Wound Assessment Details Patient Name: Standish, Addilynne C. Date of Service: 11/21/2014 1:45 PM Medical Record Number: [id number] Patient Account Number: [id number] Date of Birth/Sex: 1927-06-07 (81 y.o. Female) Treating RN: Cornell Barman Primary Care Physician: Ria Bush Other Clinician: Referring Physician: Ria Bush Treating Physician/Extender: Frann Rider in Treatment: 14 Wound Status Wound Number: 1 Primary Venous Leg Ulcer Etiology: Wound Location: Right Lower Leg - Medial Wound Open Wounding Event: Gradually Appeared Status: Date Acquired: 04/04/2014 Comorbid Cataracts, Anemia, Coronary Artery Weeks Of Treatment: 14 History: Disease, Hypertension, Myocardial Clustered Wound: No Infarction, Type II Diabetes, Osteoarthritis Wound Measurements Length: (cm) 4.7 Width: (cm) 3.1 Depth: (cm) 0.1 Area: (cm) 11.443 Volume: (cm) 1.144 % Reduction in Area: -94.3% % Reduction in Volume: 35.3% Epithelialization: Small (1-33%) Wound  Description Full Thickness Without Exposed Foul Odor Classification: Support Structures Diabetic Severity Grade 1 (Wagner): Wound Margin: Flat and Intact Exudate Amount: Large Exudate Type: Serosanguineous Exudate Color: red, brown After Cleansing: No Wound Bed Granulation Amount: Large (67-100%) Exposed Structure Granulation Quality: Red, Pink, Hyper-granulation Fascia Exposed: No Necrotic Amount: Small (1-33%) Fat Layer Exposed: No Necrotic Quality: Adherent Slough Tendon Exposed: No Muscle Exposed: No Joint Exposed: No Bone Exposed: No Limited to Skin Breakdown Periwound Skin Texture Texture Color Langer, Shalena C. ([id number]) No Abnormalities Noted: No No Abnormalities Noted: No Callus: No Atrophie Blanche: No Crepitus: No Cyanosis: No Excoriation: Yes Ecchymosis: No Fluctuance: No Erythema: Yes Friable: No Erythema Location: Circumferential Induration: No Hemosiderin Staining: No Localized Edema: No Mottled: No Rash: No Pallor: No Scarring: No Rubor: No Moisture Temperature / Pain No Abnormalities Noted: No Temperature: No Abnormality Dry / Scaly: No Tenderness on Palpation: Yes Maceration: Yes Moist: Yes Wound Preparation Ulcer Cleansing: Other: soap and water, Topical Anesthetic Applied: None, Other: lidocaine4%, Assessment Notes Apligraf applied last week, left cover dressing on. Cannot measure this week. Treatment Notes Wound #1 (Right, Medial Lower Leg) 1. Cleansed with: Cleanse wound with antibacterial soap and water 4. Dressing Applied: Other dressing (specify in notes) 5. Secondary Dressing Applied ABD Pad 7. Secured with 2 Layer Compression System - Right Lower Extremity Notes Drawtex and ADB over Apligraf; mepitel lite over heel to prevent blistering. Electronic Signature(s) Signed: 11/21/2014 5:07:03 PM By: Gretta Cool, RN, BSN, Kim RN, BSN Entered By: Gretta Cool, RN, BSN, Kim on 11/21/2014 14:02:29 Fratus, [name]  ([id number]) -------------------------------------------------------------------------------- [place] Details Patient Name: Orlowski, Lucina C. Date of Service: 11/21/2014 1:45 PM Medical Record Number: [id number] Patient Account Number: [id number] Date of Birth/Sex: [birth year] (81 y.o. Female) Treating RN: Cornell Barman Primary Care Physician: Ria Bush Other Clinician: Referring Physician: Ria Bush Treating Physician/Extender: Frann Rider in Treatment: 14 Vital Signs Time Taken: 13:55 Temperature (F): 98.2 Height (in):  58 Pulse (bpm): 75 Weight (lbs): 151 Respiratory Rate (breaths/min): 18 Body Mass Index (BMI): 31.6 Blood Pressure (mmHg): 114/50 Reference Range: 80 - 120 mg / dl Electronic Signature(s) Signed: 11/21/2014 5:07:03 PM By: Gretta Cool, RN, BSN, Kim RN, BSN Entered By: Gretta Cool, RN, BSN, Kim on 11/21/2014 13:56:13

## 2014-11-22 NOTE — Progress Notes (Addendum)
[name], [name] ([id number]) Visit Report for 11/21/2014 Chief Complaint Document Details Patient Name: [name], [name] Date of Service: 11/21/2014 1:45 PM Medical Record Number: [id number] Patient Account Number: [id number] Date of Birth/Sex: 01-Sep-1927 (81 y.o. Female) Treating RN: Cornell Barman Primary Care Physician: Ria Bush Other Clinician: Referring Physician: Ria Bush Treating Physician/Extender: Frann Rider in Treatment: 14 Information Obtained from: Patient Chief Complaint Patient presents for treatment of an open ulcer due to venous insufficiency. 81 year old patient who is known to me from the [facility] wound care center now comes in follow up here for a chronic venous ulceration of her right lower extremity which she's had for about 6 months. Electronic Signature(s) Signed: 11/21/2014 4:25:01 PM By: Christin Fudge MD, FACS Entered By: Christin Fudge on 11/21/2014 14:13:15 Feil, [name] -------------------------------------------------------------------------------- HPI Details Patient Name: [name], [name] Date of Service: 11/21/2014 1:45 PM Medical Record Number: [id number] Patient Account Number: [id number] Date of Birth/Sex: 1928/02/23 (81 y.o. Female) Treating RN: Cornell Barman Primary Care Physician: Ria Bush Other Clinician: Referring Physician: Ria Bush Treating Physician/Extender: Frann Rider in Treatment: 14 History of Present Illness Location: ulcer in the right lower extremity for several months Quality: Patient reports experiencing a dull pain to affected area(s). Severity: Patient states wound (s) are getting better. Duration: Patient has had the wound for > 3 months prior to seeking treatment at the wound center Timing: Pain in wound is Intermittent (comes and goes Context: The wound appeared gradually over time Modifying Factors: Consults to this date include: vascular surgery for the  veins in December 2015 and she's been seeing the wound care center at [facility] for several months. Associated Signs and Symptoms: Patient reports having difficulty standing for long periods. HPI Description: This is an 81 yrs old female we have been following since the beginning of January for a wound on the right medial ankle. She had previously had venous ablation of the greater saphenous vein by Dr. Kellie Simmering. She came to [place] with erythema of the right leg and subsequently is opened in the medial aspect of the right ankle. She has not been very compliant with compression. We ordered her at juxta lite stocking, however she apparently has not been able to wear it. The area on the medial aspect of her left ankle has much less induration and erythema. The edema is better controlled. No evidence of infection is noted. 07/18/14; no major change in the condition of this wound. There is still surface slough and probably some drainage. 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 All other veins showed no evidence of DVT, superficial venous thrombosis or incompetence. however she does have manifestations of chronic venous hypertension and I believe we will change her dressing over to Prisma and a Profore light dressing. she sees her primary care doctor regularly and her last hemoglobin A1c has been around 7%.her other comorbidities are hypertension, CABG in 1998, peptic ulcer disease, history of echo with a ejection fraction of 55-60% and grade 1 diastolic dysfunction. past surgical history include coronary artery bypass graft in 1998, cholecystectomy, appendectomy, hand surgery, endovenous ablation of the saphenous vein with laser by Dr. Kellie Simmering in December 2015. 08/29/2014 - she complains of increased calf pain over the past week. removed her compression bandage and pain improved. no  fever or chills. Mild clear drainage. 09/04/2014 -- she has not been compliant and  has not done a dressing change as we had ordered last week. She always complains and does not keep her wrap on and this past week she has done the same. She has completed a course of doxycycline. 09/25/2014 -- she has tolerated her dressing all week and has not removed it. She says it's hurting a lot today and would not like any debridement. We will do so as per her wishes. she did get a appointment with the vascular surgeon and sees him on 6/28. [name], [name] ([id number]) 10/03/2014 - She was seen by Dr. Tinnie Gens 09/30/2014. Assessment: I reviewed the previous venous duplex exam study performed in September 2015 which reveals no options for treating this venous ulcer. There is no significant superficial venous reflux to treat with laser ablation.Most recent ABI was 0.91 in the right foot with palpable dorsalis pedis pulse at 3+ Plan: patient needs to return to the [place] wound center and be treated by Dr. Con Memos. No arterial or venous options available from vascular standpoint or treatment of right leg stasis ulcer.Continued dressing changes and compression is the treatment of choice 10/16/2014 -- today will be her first application of Apligraf. 10/23/2014 -- we are going to be able to apply her second day of Apligraf next week. She is complaining of some tenderness in the region of Achilles tendon and this is possibly due to her wrap. 11/06/2014 -- she did not have Apligraf last week because the wound was not looking good but she says she is feeling much better now. 11/13/2014 -- she has been doing well this week and she is here for her second Apligraf. Electronic Signature(s) Signed: 11/21/2014 4:25:01 PM By: Christin Fudge MD, FACS Entered By: Christin Fudge on 11/21/2014 14:13:21 [name], [name]  ([id number]) -------------------------------------------------------------------------------- Physical Exam Details Patient Name: [name], [name] Date of Service: 11/21/2014 1:45 PM Medical Record Number: [id number] Patient Account Number: [id number] Date of Birth/Sex: 13-Apr-1927 (81 y.o. Female) Treating RN: Cornell Barman Primary Care Physician: Ria Bush Other Clinician: Referring Physician: Ria Bush Treating Physician/Extender: Frann Rider in Treatment: 14 Constitutional . Pulse regular. Respirations normal and unlabored. Afebrile. . Eyes Nonicteric. Reactive to light. Ears, Nose, Mouth, and Throat Lips, teeth, and gums WNL.Marland Kitchen Moist mucosa without lesions . Neck supple and nontender. No palpable supraclavicular or cervical adenopathy. Normal sized without goiter. Respiratory WNL. No retractions.. Breath sounds WNL, No rubs, rales, rhonchi, or wheeze.. Cardiovascular Heart rhythm and rate regular, no murmur or gallop.. Pedal Pulses WNL. No clubbing, cyanosis or edema. Lymphatic No adneopathy. No adenopathy. No adenopathy. Musculoskeletal Adexa without tenderness or enlargement.. Digits and nails w/o clubbing, cyanosis, infection, petechiae, ischemia, or inflammatory conditions.. Integumentary (Hair, Skin) No suspicious lesions. No crepitus or fluctuance. No peri-wound warmth or erythema. No masses.Marland Kitchen Psychiatric Judgement and insight Intact.. No evidence of depression, anxiety, or agitation.. Notes The wound is fine but she has a bit of a blister from an ill fitting shoe and this is in the region of the graft. I recommended reapplying a form protection and a few more Steri-Strips over this. Electronic Signature(s) Signed: 11/21/2014 4:25:01 PM By: Christin Fudge MD, FACS Entered By: Christin Fudge on 11/21/2014 14:14:18 [name], [name] ([id number]) -------------------------------------------------------------------------------- Physician Orders  Details Patient Name: [name], [name] Date of Service: 11/21/2014 1:45 PM Medical Record Number: [id number] Patient Account Number: [id number] Date of Birth/Sex: Oct 16, 1927 (81 y.o. Female) Treating RN: Cornell Barman Primary Care Physician: Ria Bush Other Clinician: Referring Physician: Ria Bush Treating Physician/Extender: Frann Rider in Treatment:  14 Verbal / Phone Orders: Yes Clinician: Cornell Barman Read Back and Verified: Yes Diagnosis Coding ICD-10 Coding Code Description E11.622 Type 2 diabetes mellitus with other skin ulcer E11.610 Type 2 diabetes mellitus with diabetic neuropathic arthropathy Chronic venous hypertension (idiopathic) with ulcer and inflammation of right lower I87.331 extremity L03.115 Cellulitis of right lower limb Wound Cleansing Wound #1 Right,Medial Lower Leg o Cleanse wound with mild soap and water Anesthetic Wound #1 Right,Medial Lower Leg o Topical Lidocaine 4% cream applied to wound bed prior to debridement Primary Wound Dressing Wound #1 Right,Medial Lower Leg o Drawtex Secondary Dressing Wound #1 Right,Medial Lower Leg o ABD pad Dressing Change Frequency Wound #1 Right,Medial Lower Leg o Change dressing every week Follow-up Appointments Wound #1 Right,Medial Lower Leg o Return Appointment in 1 week. Edema Control o 2 Layer Lite Compression System - Right Lower Extremity [name], [name] ([id number]) Additional Orders / Instructions Wound #1 Right,Medial Lower Leg o Increase protein intake. Notes Mepitel lite over heel to prevent blister Electronic Signature(s) Signed: 11/21/2014 4:25:01 PM By: Christin Fudge MD, FACS Signed: 11/21/2014 5:07:03 PM By: Gretta Cool RN, BSN, Kim RN, BSN Entered By: Gretta Cool, RN, BSN, Kim on 11/21/2014 14:19:07 [name], [name] ([id number]) -------------------------------------------------------------------------------- Problem List Details Patient Name: Kratz, Tayonna  C. Date of Service: 11/21/2014 1:45 PM Medical Record Number: [id number] Patient Account Number: [id number] Date of Birth/Sex: February 21, 1928 (81 y.o. Female) Treating RN: Cornell Barman Primary Care Physician: Ria Bush Other Clinician: Referring Physician: Ria Bush Treating Physician/Extender: Frann Rider in Treatment: 14 Active Problems ICD-10 Encounter Code Description Active Date Diagnosis E11.622 Type 2 diabetes mellitus with other skin ulcer 08/15/2014 Yes E11.610 Type 2 diabetes mellitus with diabetic neuropathic 08/15/2014 Yes arthropathy I87.331 Chronic venous hypertension (idiopathic) with ulcer and 08/15/2014 Yes inflammation of right lower extremity L03.115 Cellulitis of right lower limb 08/29/2014 Yes Inactive Problems Resolved Problems Electronic Signature(s) Signed: 11/21/2014 4:25:01 PM By: Christin Fudge MD, FACS Entered By: Christin Fudge on 11/21/2014 14:13:09 [name], [name] ([id number]) -------------------------------------------------------------------------------- Progress Note Details Patient Name: [name], [name] Date of Service: 11/21/2014 1:45 PM Medical Record Number: [id number] Patient Account Number: [id number] Date of Birth/Sex: 21-Nov-1927 (81 y.o. Female) Treating RN: Cornell Barman Primary Care Physician: Ria Bush Other Clinician: Referring Physician: Ria Bush Treating Physician/Extender: Frann Rider in Treatment: 14 Subjective Chief Complaint Information obtained from Patient Patient presents for treatment of an open ulcer due to venous insufficiency. 81 year old patient who is known to me from the [facility] wound care center now comes in follow up here for a chronic venous ulceration of her right lower extremity which she's had for about 6 months. History of Present Illness (HPI) The following HPI elements were documented for the patient's wound: Location: ulcer in the right lower extremity for several  months Quality: Patient reports experiencing a dull pain to affected area(s). Severity: Patient states wound (s) are getting better. Duration: Patient has had the wound for > 3 months prior to seeking treatment at the wound center Timing: Pain in wound is Intermittent (comes and goes Context: The wound appeared gradually over time Modifying Factors: Consults to this date include: vascular surgery for the veins in December 2015 and she's been seeing the wound care center at [facility] for several months. Associated Signs and Symptoms: Patient reports having difficulty standing for long periods. This is an 81 yrs old female we have been following since the beginning of January for a wound on the right medial ankle. She had previously had venous ablation of the  greater saphenous vein by Dr. Kellie Simmering. She came to [place] with erythema of the right leg and subsequently is opened in the medial aspect of the right ankle. She has not been very compliant with compression. We ordered her at juxta lite stocking, however she apparently has not been able to wear it. The area on the medial aspect of her left ankle has much less induration and erythema. The edema is better controlled. No evidence of infection is noted. 07/18/14; no major change in the condition of this wound. There is still surface slough and probably some drainage. 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 All other veins showed no evidence of DVT, superficial venous thrombosis or incompetence. however she does have manifestations of chronic venous hypertension and I believe we will change her dressing over to Prisma and a Profore light dressing. she sees her primary care doctor regularly and her last hemoglobin A1c has been around 7%.her other comorbidities are hypertension, CABG in 1998, peptic ulcer disease, history of echo  with a ejection fraction of 55-60% and grade 1 diastolic dysfunction. past surgical history include coronary artery bypass graft in 1998, cholecystectomy, appendectomy, hand surgery, endovenous ablation of the saphenous vein with laser by Dr. Kellie Simmering in December 2015. [name], [name] ([id number]) 08/29/2014 - she complains of increased calf pain over the past week. removed her compression bandage and pain improved. no fever or chills. Mild clear drainage. 09/04/2014 -- she has not been compliant and has not done a dressing change as we had ordered last week. She always complains and does not keep her wrap on and this past week she has done the same. She has completed a course of doxycycline. 09/25/2014 -- she has tolerated her dressing all week and has not removed it. She says it's hurting a lot today and would not like any debridement. We will do so as per her wishes. she did get a appointment with the vascular surgeon and sees him on 6/28. 10/03/2014 - She was seen by Dr. Tinnie Gens 09/30/2014. Assessment: I reviewed the previous venous duplex exam study performed in September 2015 which reveals no options for treating this venous ulcer. There is no significant superficial venous reflux to treat with laser ablation.Most recent ABI was 0.91 in the right foot with palpable dorsalis pedis pulse at 3+ Plan: patient needs to return to the [place] wound center and be treated by Dr. Con Memos. No arterial or venous options available from vascular standpoint or treatment of right leg stasis ulcer.Continued dressing changes and compression is the treatment of choice 10/16/2014 -- today will be her first application of Apligraf. 10/23/2014 -- we are going to be able to apply her second day of Apligraf next week. She is complaining of some tenderness in the region of Achilles tendon and this is possibly due to her wrap. 11/06/2014 -- she did not have Apligraf last week because the wound was not looking  good but she says she is feeling much better now. 11/13/2014 -- she has been doing well this week and she is here for her second Apligraf. Objective Constitutional Pulse regular. Respirations normal and unlabored. Afebrile. Vitals Time Taken: 1:55 PM, Height: 58 in, Weight: 151 lbs, BMI: 31.6, Temperature: 98.2 F, Pulse: 75 bpm, Respiratory Rate: 18 breaths/min, Blood Pressure: 114/50 mmHg. Eyes Nonicteric. Reactive to light. Ears, Nose, Mouth, and Throat Lips, teeth, and  gums WNL.Marland Kitchen Moist mucosa without lesions . Neck supple and nontender. No palpable supraclavicular or cervical adenopathy. Normal sized without goiter. Respiratory [name], Elexis C. ([id number]) WNL. No retractions.. Breath sounds WNL, No rubs, rales, rhonchi, or wheeze.. Cardiovascular Heart rhythm and rate regular, no murmur or gallop.. Pedal Pulses WNL. No clubbing, cyanosis or edema. Lymphatic No adneopathy. No adenopathy. No adenopathy. Musculoskeletal Adexa without tenderness or enlargement.. Digits and nails w/o clubbing, cyanosis, infection, petechiae, ischemia, or inflammatory conditions.Marland Kitchen Psychiatric Judgement and insight Intact.. No evidence of depression, anxiety, or agitation.. General Notes: The wound is fine but she has a bit of a blister from an ill fitting shoe and this is in the region of the graft. I recommended reapplying a form protection and a few more Steri-Strips over this. Integumentary (Hair, Skin) No suspicious lesions. No crepitus or fluctuance. No peri-wound warmth or erythema. No masses.. Wound #1 status is Open. Original cause of wound was Gradually Appeared. The wound is located on the Right,Medial Lower Leg. The wound measures 4.7cm length x 3.1cm width x 0.1cm depth; 11.443cm^2 area and 1.144cm^3 volume. The wound is limited to skin breakdown. There is a large amount of serosanguineous drainage noted. The wound margin is flat and intact. There is large (67-100%) red,  pink granulation within the wound bed. There is a small (1-33%) amount of necrotic tissue within the wound bed including Adherent Slough. The periwound skin appearance exhibited: Excoriation, Maceration, Moist, Erythema. The periwound skin appearance did not exhibit: Callus, Crepitus, Fluctuance, Friable, Induration, Localized Edema, Rash, Scarring, Dry/Scaly, Atrophie Blanche, Cyanosis, Ecchymosis, Hemosiderin Staining, Mottled, Pallor, Rubor. The surrounding wound skin color is noted with erythema which is circumferential. Periwound temperature was noted as No Abnormality. The periwound has tenderness on palpation. General Notes: Apligraf applied last week, left cover dressing on. Cannot measure this week. Assessment Active Problems ICD-10 E11.622 - Type 2 diabetes mellitus with other skin ulcer E11.610 - Type 2 diabetes mellitus with diabetic neuropathic arthropathy I87.331 - Chronic venous hypertension (idiopathic) with ulcer and inflammation of right lower extremity L03.115 - Cellulitis of right lower limb Kraynak, Luther C. ([id number]) The wound is fine but she has a bit of a blister from an ill fitting shoe and this is in the region of the graft. I recommended reapplying a form protection and a few more Steri-Strips over this. She will come and see me next week for her third application of Apligraf Plan Wound Cleansing: Wound #1 Right,Medial Lower Leg: Cleanse wound with mild soap and water Anesthetic: Wound #1 Right,Medial Lower Leg: Topical Lidocaine 4% cream applied to wound bed prior to debridement Primary Wound Dressing: Wound #1 Right,Medial Lower Leg: Drawtex Secondary Dressing: Wound #1 Right,Medial Lower Leg: ABD pad Dressing Change Frequency: Wound #1 Right,Medial Lower Leg: Change dressing every week Follow-up Appointments: Wound #1 Right,Medial Lower Leg: Return Appointment in 1 week. Edema Control: 2 Layer Lite Compression System - Right Lower  Extremity Additional Orders / Instructions: Wound #1 Right,Medial Lower Leg: Increase protein intake. General Notes: Mepitel lite over heel to prevent blister The wound is fine but she has a bit of a blister from an ill fitting shoe and this is in the region of the graft. I recommended reapplying a form protection and a few more Steri-Strips over this. She will come and see me next week for her third application of Apligraf Electronic Signature(s) Signed: 11/24/2014 8:28:35 AM By: Christin Fudge MD, FACS AMEA, WALSHE ([id number]) Previous Signature: 11/21/2014 4:25:01 PM Version By: Christin Fudge MD,  FACS Entered By: Christin Fudge on 11/24/2014 08:28:01 Rames, Shantara Loletha Grayer ([id number]) -------------------------------------------------------------------------------- SuperBill Details Patient Name: Heldman, Tasheema C. Date of Service: 11/21/2014 Medical Record Number: [id number] Patient Account Number: [id number] Date of Birth/Sex: [birth year] (81 y.o. Female) Treating RN: Cornell Barman Primary Care Physician: Ria Bush Other Clinician: Referring Physician: Ria Bush Treating Physician/Extender: Frann Rider in Treatment: 14 Diagnosis Coding ICD-10 Codes Code Description E11.622 Type 2 diabetes mellitus with other skin ulcer E11.610 Type 2 diabetes mellitus with diabetic neuropathic arthropathy Chronic venous hypertension (idiopathic) with ulcer and inflammation of right lower I87.331 extremity L03.115 Cellulitis of right lower limb Facility Procedures CPT4: Description Modifier Quantity Code [id number] (Facility Use Only) [phone] - [place] [id number] LWR RT 1 LEG Physician Procedures CPT4: Description Modifier Quantity Code [id number] - WC PHYS LEVEL 3 - EST PT 1 ICD-10 Description Diagnosis E11.622 Type 2 diabetes mellitus with other skin ulcer E11.610 Type 2 diabetes mellitus with diabetic neuropathic arthropathy I87.331 Chronic  venous hypertension  (idiopathic) with ulcer and inflammation of right lower extremity L03.115 Cellulitis of right lower limb Electronic Signature(s) Signed: 11/21/2014 4:25:01 PM By: Christin Fudge MD, FACS Signed: 11/21/2014 5:07:03 PM By: Gretta Cool RN, BSN, Kim RN, BSN Entered By: Gretta Cool, RN, BSN, Kim on 11/21/2014 14:16:51

## 2014-11-27 ENCOUNTER — Encounter: Payer: Medicare Other | Admitting: Surgery

## 2014-11-27 DIAGNOSIS — L03115 Cellulitis of right lower limb: Secondary | ICD-10-CM | POA: Diagnosis not present

## 2014-11-27 DIAGNOSIS — I10 Essential (primary) hypertension: Secondary | ICD-10-CM | POA: Diagnosis not present

## 2014-11-27 DIAGNOSIS — I87331 Chronic venous hypertension (idiopathic) with ulcer and inflammation of right lower extremity: Secondary | ICD-10-CM | POA: Diagnosis not present

## 2014-11-27 DIAGNOSIS — L97211 Non-pressure chronic ulcer of right calf limited to breakdown of skin: Secondary | ICD-10-CM | POA: Diagnosis not present

## 2014-11-27 DIAGNOSIS — E11622 Type 2 diabetes mellitus with other skin ulcer: Secondary | ICD-10-CM | POA: Diagnosis not present

## 2014-11-27 DIAGNOSIS — E11610 Type 2 diabetes mellitus with diabetic neuropathic arthropathy: Secondary | ICD-10-CM | POA: Diagnosis not present

## 2014-11-27 NOTE — Progress Notes (Signed)
NEILA, HOLTS ([id number]) Visit Report for 11/27/2014 Chief Complaint Document Details Patient Name: Grigorian, [name] Date of Service: 11/27/2014 1:45 PM Medical Record Number: [id number] Patient Account Number: [id number] Date of Birth/Sex: Apr 18, 1927 (81 y.o. Female) Treating RN: Montey Hora Primary Care Physician: Ria Bush Other Clinician: Referring Physician: Ria Bush Treating Physician/Extender: Frann Rider in Treatment: 14 Information Obtained from: Patient Chief Complaint Patient presents for treatment of an open ulcer due to venous insufficiency. 81 year old patient who is known to me from the [facility] wound care center now comes in follow up here for a chronic venous ulceration of her right lower extremity which she's had for about 6 months. Electronic Signature(s) Signed: 11/27/2014 2:53:47 PM By: Christin Fudge MD, FACS Entered By: Christin Fudge on 11/27/2014 14:53:47 Braddy, [name] ([id number]) -------------------------------------------------------------------------------- Cellular or Tissue Based Product Details Patient Name: Killilea, Amali C. Date of Service: 11/27/2014 1:45 PM Medical Record Number: [id number] Patient Account Number: [id number] Date of Birth/Sex: [birth year] (81 y.o. Female) Treating RN: Montey Hora Primary Care Physician: Ria Bush Other Clinician: Referring Physician: Ria Bush Treating Physician/Extender: Frann Rider in Treatment: 14 Cellular or Tissue Based Wound #1 Right,Medial Lower Leg Product Type Applied to: Performed By: Physician Pat Patrick., MD Cellular or Tissue Based Apligraf Product Type: Time-Out Taken: Yes Location: trunk / arms / legs Wound Size (sq cm): 17.1 Product Size (sq cm): 44 Waste Size (sq cm): 30 Waste Reason: wound size Amount of Product Applied (sq cm): 14 Lot #: [id number] Order #[id number] Expiration Date: 12/04/2014 Fenestrated:  Yes Instrument: Blade Reconstituted: No Secured: Yes Secured With: Steri-Strips Dressing Applied: Yes Primary Dressing: Mepitel One Procedural Pain: 0 Post Procedural Pain: 0 Response to Treatment: Procedure was tolerated well Post Procedure Diagnosis Same as Pre-procedure Electronic Signature(s) Signed: 11/27/2014 2:53:39 PM By: Christin Fudge MD, FACS Entered By: Christin Fudge on 11/27/2014 14:53:39 Gautney, [name] ([id number]) -------------------------------------------------------------------------------- HPI Details Patient Name: Sobczak, Taylin C. Date of Service: 11/27/2014 1:45 PM Medical Record Number: [id number] Patient Account Number: [id number] Date of Birth/Sex: 1927/04/24 (81 y.o. Female) Treating RN: Montey Hora Primary Care Physician: Ria Bush Other Clinician: Referring Physician: Ria Bush Treating Physician/Extender: Frann Rider in Treatment: 14 History of Present Illness Location: ulcer in the right lower extremity for several months Quality: Patient reports experiencing a dull pain to affected area(s). Severity: Patient states wound (s) are getting better. Duration: Patient has had the wound for > 3 months prior to seeking treatment at the wound center Timing: Pain in wound is Intermittent (comes and goes Context: The wound appeared gradually over time Modifying Factors: Consults to this date include: vascular surgery for the veins in December 2015 and she's been seeing the wound care center at [facility] for several months. Associated Signs and Symptoms: Patient reports having difficulty standing for long periods. HPI Description: This is an 81 yrs old female we have been following since the beginning of January for a wound on the right medial ankle. She had previously had venous ablation of the greater saphenous vein by Dr. Kellie Simmering. She came to [place] with erythema of the right leg and subsequently is opened in the medial aspect of the  right ankle. She has not been very compliant with compression. We ordered her at juxta lite stocking, however she apparently has not been able to wear it. The area on the medial aspect of her left ankle has much less induration and erythema. The edema is better controlled. No evidence of infection is noted. 07/18/14; no  major change in the condition of this wound. There is still surface slough and probably some drainage. 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 All other veins showed no evidence of DVT, superficial venous thrombosis or incompetence. however she does have manifestations of chronic venous hypertension and I believe we will change her dressing over to Prisma and a Profore light dressing. she sees her primary care doctor regularly and her last hemoglobin A1c has been around 7%.her other comorbidities are hypertension, CABG in 1998, peptic ulcer disease, history of echo with a ejection fraction of 55-60% and grade 1 diastolic dysfunction. past surgical history include coronary artery bypass graft in 1998, cholecystectomy, appendectomy, hand surgery, endovenous ablation of the saphenous vein with laser by Dr. Kellie Simmering in December 2015. 08/29/2014 - she complains of increased calf pain over the past week. removed her compression bandage and pain improved. no fever or chills. Mild clear drainage. 09/04/2014 -- she has not been compliant and has not done a dressing change as we had ordered last week. She always complains and does not keep her wrap on and this past week she has done the same. She has completed a course of doxycycline. 09/25/2014 -- she has tolerated her dressing all week and has not removed it. She says it's hurting a lot today and would not like any debridement. We will do so as per her wishes. she did get a appointment with the vascular surgeon and  sees him on 6/28. [name] ([id number]) 10/03/2014 - She was seen by Dr. Tinnie Gens 09/30/2014. Assessment: I reviewed the previous venous duplex exam study performed in September 2015 which reveals no options for treating this venous ulcer. There is no significant superficial venous reflux to treat with laser ablation.Most recent ABI was 0.91 in the right foot with palpable dorsalis pedis pulse at 3+ Plan: patient needs to return to the [place] wound center and be treated by Dr. Con Memos. No arterial or venous options available from vascular standpoint or treatment of right leg stasis ulcer.Continued dressing changes and compression is the treatment of choice 10/16/2014 -- today will be her first application of Apligraf. 10/23/2014 -- we are going to be able to apply her second day of Apligraf next week. She is complaining of some tenderness in the region of Achilles tendon and this is possibly due to her wrap. 11/06/2014 -- she did not have Apligraf last week because the wound was not looking good but she says she is feeling much better now. 11/13/2014 -- she has been doing well this week and she is here for her second Apligraf. 11/27/2014 -- she is here for her third application of Apligraf Electronic Signature(s) Signed: 11/27/2014 2:54:11 PM By: Christin Fudge MD, FACS Entered By: Christin Fudge on 11/27/2014 14:54:10 Innocent, [name] ([id number]) -------------------------------------------------------------------------------- Physical Exam Details Patient Name: Sookdeo, Melisia C. Date of Service: 11/27/2014 1:45 PM Medical Record Number: [id number] Patient Account Number: [id number] Date of Birth/Sex: 1927/05/27 (81 y.o. Female) Treating RN: Montey Hora Primary Care Physician: Ria Bush Other Clinician: Referring Physician: Ria Bush Treating Physician/Extender: Frann Rider in Treatment: 14 Constitutional . Pulse regular. Respirations normal and  unlabored. Afebrile. . Eyes Nonicteric. Reactive to light. Ears, Nose, Mouth, and Throat Lips, teeth, and gums WNL.Marland Kitchen Moist mucosa without lesions . Neck supple and nontender. No palpable supraclavicular or cervical adenopathy. Normal sized without goiter. Respiratory  WNL. No retractions.. Cardiovascular Pedal Pulses WNL. No clubbing, cyanosis or edema. Lymphatic No adneopathy. No adenopathy. No adenopathy. Musculoskeletal Adexa without tenderness or enlargement.. Digits and nails w/o clubbing, cyanosis, infection, petechiae, ischemia, or inflammatory conditions.. Integumentary (Hair, Skin) No suspicious lesions. No crepitus or fluctuance. No peri-wound warmth or erythema. No masses.Marland Kitchen Psychiatric Judgement and insight Intact.. No evidence of depression, anxiety, or agitation.. Notes The wound looks clean and there is healthy granulation tissue and will be ready for her next application of Apligraf. Electronic Signature(s) Signed: 11/27/2014 2:54:44 PM By: Christin Fudge MD, FACS Entered By: Christin Fudge on 11/27/2014 14:54:43 Santoyo, [name] ([id number]) -------------------------------------------------------------------------------- Physician Orders Details Patient Name: Galdamez, Sherleen C. Date of Service: 11/27/2014 1:45 PM Medical Record Number: [id number] Patient Account Number: [id number] Date of Birth/Sex: 12/13/1927 (81 y.o. Female) Treating RN: Cornell Barman Primary Care Physician: Ria Bush Other Clinician: Referring Physician: Ria Bush Treating Physician/Extender: Frann Rider in Treatment: 14 Verbal / Phone Orders: Yes Clinician: Cornell Barman Read Back and Verified: Yes Diagnosis Coding Wound Cleansing Wound #1 Right,Medial Lower Leg o Cleanse wound with mild soap and water Anesthetic Wound #1 Right,Medial Lower Leg o Topical Lidocaine 4% cream applied to wound bed prior to debridement Skin Barriers/Peri-Wound Care Wound #1 Right,Medial  Lower Leg o Skin Prep Primary Wound Dressing Wound #1 Right,Medial Lower Leg o Drawtex Secondary Dressing Wound #1 Right,Medial Lower Leg o ABD pad Dressing Change Frequency Wound #1 Right,Medial Lower Leg o Change dressing every week Follow-up Appointments Wound #1 Right,Medial Lower Leg o Return Appointment in 1 week. Edema Control Wound #1 Right,Medial Lower Leg o 2 Layer Lite Compression System - Right Lower Extremity Additional Orders / Instructions Wound #1 Right,Medial Lower Leg Miklos, Tyronda C. ([id number]) o Increase protein intake. Advanced Therapies Wound #1 Right,Medial Lower Leg o Apligraf application in clinic; including contact layer, fixation with steri strips, dry gauze and cover dressing. Notes Mepitel foam on back of heel for protection. Electronic Signature(s) Signed: 11/27/2014 3:28:23 PM By: Gretta Cool RN, BSN, Kim RN, BSN Signed: 11/27/2014 4:07:43 PM By: Christin Fudge MD, FACS Entered By: Gretta Cool RN, BSN, Kim on 11/27/2014 14:46:03 Koziol, [name] ([id number]) -------------------------------------------------------------------------------- Problem List Details Patient Name: Mulhall, Mazi C. Date of Service: 11/27/2014 1:45 PM Medical Record Number: [id number] Patient Account Number: [id number] Date of Birth/Sex: 1928/01/26 (81 y.o. Female) Treating RN: Montey Hora Primary Care Physician: Ria Bush Other Clinician: Referring Physician: Ria Bush Treating Physician/Extender: Frann Rider in Treatment: 14 Active Problems ICD-10 Encounter Code Description Active Date Diagnosis E11.622 Type 2 diabetes mellitus with other skin ulcer 08/15/2014 Yes E11.610 Type 2 diabetes mellitus with diabetic neuropathic 08/15/2014 Yes arthropathy I87.331 Chronic venous hypertension (idiopathic) with ulcer and 08/15/2014 Yes inflammation of right lower extremity L03.115 Cellulitis of right lower limb 08/29/2014 Yes Inactive  Problems Resolved Problems Electronic Signature(s) Signed: 11/27/2014 2:53:32 PM By: Christin Fudge MD, FACS Entered By: Christin Fudge on 11/27/2014 14:53:32 Chervenak, [name] ([id number]) -------------------------------------------------------------------------------- Progress Note Details Patient Name: Begley, Cambree C. Date of Service: 11/27/2014 1:45 PM Medical Record Number: [id number] Patient Account Number: [id number] Date of Birth/Sex: September 19, 1927 (81 y.o. Female) Treating RN: Montey Hora Primary Care Physician: Ria Bush Other Clinician: Referring Physician: Ria Bush Treating Physician/Extender: Frann Rider in Treatment: 14 Subjective Chief Complaint Information obtained from Patient Patient presents for treatment of an open ulcer due to venous insufficiency. 81 year old patient who is known to me from the [facility] wound care center now comes in follow up here for a chronic venous ulceration  of her right lower extremity which she's had for about 6 months. History of Present Illness (HPI) The following HPI elements were documented for the patient's wound: Location: ulcer in the right lower extremity for several months Quality: Patient reports experiencing a dull pain to affected area(s). Severity: Patient states wound (s) are getting better. Duration: Patient has had the wound for > 3 months prior to seeking treatment at the wound center Timing: Pain in wound is Intermittent (comes and goes Context: The wound appeared gradually over time Modifying Factors: Consults to this date include: vascular surgery for the veins in December 2015 and she's been seeing the wound care center at [facility] for several months. Associated Signs and Symptoms: Patient reports having difficulty standing for long periods. This is an 81 yrs old female we have been following since the beginning of January for a wound on the right medial ankle. She had previously had venous  ablation of the greater saphenous vein by Dr. Kellie Simmering. She came to [place] with erythema of the right leg and subsequently is opened in the medial aspect of the right ankle. She has not been very compliant with compression. We ordered her at juxta lite stocking, however she apparently has not been able to wear it. The area on the medial aspect of her left ankle has much less induration and erythema. The edema is better controlled. No evidence of infection is noted. 07/18/14; no major change in the condition of this wound. There is still surface slough and probably some drainage. 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 All other veins showed no evidence of DVT, superficial venous thrombosis or incompetence. however she does have manifestations of chronic venous hypertension and I believe we will change her dressing over to Prisma and a Profore light dressing. she sees her primary care doctor regularly and her last hemoglobin A1c has been around 7%.her other comorbidities are hypertension, CABG in 1998, peptic ulcer disease, history of echo with a ejection fraction of 55-60% and grade 1 diastolic dysfunction. past surgical history include coronary artery bypass graft in 1998, cholecystectomy, appendectomy, hand surgery, endovenous ablation of the saphenous vein with laser by Dr. Kellie Simmering in December 2015. WILLER, BARBIN ([id number]) 08/29/2014 - she complains of increased calf pain over the past week. removed her compression bandage and pain improved. no fever or chills. Mild clear drainage. 09/04/2014 -- she has not been compliant and has not done a dressing change as we had ordered last week. She always complains and does not keep her wrap on and this past week she has done the same. She has completed a course of doxycycline. 09/25/2014 -- she has tolerated her dressing all week  and has not removed it. She says it's hurting a lot today and would not like any debridement. We will do so as per her wishes. she did get a appointment with the vascular surgeon and sees him on 6/28. 10/03/2014 - She was seen by Dr. Tinnie Gens 09/30/2014. Assessment: I reviewed the previous venous duplex exam study performed in September 2015 which reveals no options for treating this venous ulcer. There is no significant superficial venous reflux to treat with laser ablation.Most recent ABI was 0.91 in the right foot with palpable dorsalis pedis pulse at 3+ Plan: patient needs to return to the [place] wound center and be treated by Dr. Con Memos. No arterial  or venous options available from vascular standpoint or treatment of right leg stasis ulcer.Continued dressing changes and compression is the treatment of choice 10/16/2014 -- today will be her first application of Apligraf. 10/23/2014 -- we are going to be able to apply her second day of Apligraf next week. She is complaining of some tenderness in the region of Achilles tendon and this is possibly due to her wrap. 11/06/2014 -- she did not have Apligraf last week because the wound was not looking good but she says she is feeling much better now. 11/13/2014 -- she has been doing well this week and she is here for her second Apligraf. 11/27/2014 -- she is here for her third application of Apligraf Objective Constitutional Pulse regular. Respirations normal and unlabored. Afebrile. Vitals Time Taken: 2:28 PM, Height: 58 in, Weight: 151 lbs, BMI: 31.6, Temperature: 98.6 F, Pulse: 74 bpm, Respiratory Rate: 18 breaths/min, Blood Pressure: 142/71 mmHg. Eyes Nonicteric. Reactive to light. Ears, Nose, Mouth, and Throat Lips, teeth, and gums WNL.Marland Kitchen Moist mucosa without lesions . Neck supple and nontender. No palpable supraclavicular or cervical adenopathy. Normal sized without goiter. Jellison, Caelan C. ([id number]) Respiratory WNL. No  retractions.. Cardiovascular Pedal Pulses WNL. No clubbing, cyanosis or edema. Lymphatic No adneopathy. No adenopathy. No adenopathy. Musculoskeletal Adexa without tenderness or enlargement.. Digits and nails w/o clubbing, cyanosis, infection, petechiae, ischemia, or inflammatory conditions.Marland Kitchen Psychiatric Judgement and insight Intact.. No evidence of depression, anxiety, or agitation.. General Notes: The wound looks clean and there is healthy granulation tissue and will be ready for her next application of Apligraf. Integumentary (Hair, Skin) No suspicious lesions. No crepitus or fluctuance. No peri-wound warmth or erythema. No masses.. Wound #1 status is Open. Original cause of wound was Gradually Appeared. The wound is located on the Right,Medial Lower Leg. The wound measures 4.5cm length x 3.8cm width x 0.2cm depth; 13.43cm^2 area and 2.686cm^3 volume. The wound is limited to skin breakdown. There is a large amount of serosanguineous drainage noted. The wound margin is flat and intact. There is large (67-100%) red, pink granulation within the wound bed. There is no necrotic tissue within the wound bed. The periwound skin appearance exhibited: Excoriation, Maceration, Moist, Erythema. The periwound skin appearance did not exhibit: Callus, Crepitus, Fluctuance, Friable, Induration, Localized Edema, Rash, Scarring, Dry/Scaly, Atrophie Blanche, Cyanosis, Ecchymosis, Hemosiderin Staining, Mottled, Pallor, Rubor. The surrounding wound skin color is noted with erythema which is circumferential. Periwound temperature was noted as No Abnormality. The periwound has tenderness on palpation. Assessment Active Problems ICD-10 E11.622 - Type 2 diabetes mellitus with other skin ulcer E11.610 - Type 2 diabetes mellitus with diabetic neuropathic arthropathy I87.331 - Chronic venous hypertension (idiopathic) with ulcer and inflammation of right lower extremity L03.115 - Cellulitis of right lower  limb Halk, Francheska C. ([id number]) After applying the Apligraf in the usual fashion we will use a 2 layer compression wrap which she is able to tolerate well. She will be here to see me next week for a wound check. Procedures Wound #1 Wound #1 is a Venous Leg Ulcer located on the Right,Medial Lower Leg. A skin graft procedure using a bioengineered skin substitute/cellular or tissue based product was performed by Narcissa Melder, Jackson Latino., MD. Apligraf was applied and secured with Steri-Strips. 14 sq cm of product was utilized and 30 sq cm was wasted due to wound size. Post Application, Mepitel One was applied. A Time Out was conducted prior to the start of the procedure. The procedure was tolerated well with a pain level  of 0 throughout and a pain level of 0 following the procedure. Post procedure Diagnosis Wound #1: Same as Pre-Procedure . Plan Wound Cleansing: Wound #1 Right,Medial Lower Leg: Cleanse wound with mild soap and water Anesthetic: Wound #1 Right,Medial Lower Leg: Topical Lidocaine 4% cream applied to wound bed prior to debridement Skin Barriers/Peri-Wound Care: Wound #1 Right,Medial Lower Leg: Skin Prep Primary Wound Dressing: Wound #1 Right,Medial Lower Leg: Drawtex Secondary Dressing: Wound #1 Right,Medial Lower Leg: ABD pad Dressing Change Frequency: Wound #1 Right,Medial Lower Leg: Change dressing every week Follow-up Appointments: Wound #1 Right,Medial Lower Leg: Return Appointment in 1 week. Edema Control: Wound #1 Right,Medial Lower Leg: 2 Layer Lite Compression System - Right Lower Extremity Additional Orders / Instructions: Velis, Kele C. ([id number]) Wound #1 Right,Medial Lower Leg: Increase protein intake. Advanced Therapies: Wound #1 Right,Medial Lower Leg: Apligraf application in clinic; including contact layer, fixation with steri strips, dry gauze and cover dressing. General Notes: Mepitel foam on back of heel for protection. After applying the  Apligraf in the usual fashion we will use a 2 layer compression wrap which she is able to tolerate well. She will be here to see me next week for a wound check. Electronic Signature(s) Signed: 11/27/2014 2:55:32 PM By: Christin Fudge MD, FACS Entered By: Christin Fudge on 11/27/2014 14:55:31 Noller, [name] ([id number]) -------------------------------------------------------------------------------- SuperBill Details Patient Name: Yore, Aishani C. Date of Service: 11/27/2014 Medical Record Number: [id number] Patient Account Number: [id number] Date of Birth/Sex: [birth year] (81 y.o. Female) Treating RN: Montey Hora Primary Care Physician: Ria Bush Other Clinician: Referring Physician: Ria Bush Treating Physician/Extender: Frann Rider in Treatment: 14 Diagnosis Coding ICD-10 Codes Code Description E11.622 Type 2 diabetes mellitus with other skin ulcer E11.610 Type 2 diabetes mellitus with diabetic neuropathic arthropathy Chronic venous hypertension (idiopathic) with ulcer and inflammation of right lower I87.331 extremity L03.115 Cellulitis of right lower limb Facility Procedures CPT4: Description Modifier Quantity Code [id number] (Facility Use Only) Apligraf 1 SQ CM 77 CPT4: [id number] - SKIN SUB GRAFT TRNK/ARM/LEG 1 ICD-10 Description Diagnosis E11.622 Type 2 diabetes mellitus with other skin ulcer E11.610 Type 2 diabetes mellitus with diabetic neuropathic arthropathy I87.331 Chronic venous hypertension  (idiopathic) with ulcer and inflammation of right lower extremity L03.115 Cellulitis of right lower limb Physician Procedures CPT4: Description Modifier Quantity Code [id number] - WC PHYS SKIN SUB GRAFT TRNK/ARM/LEG 1 ICD-10 Description Diagnosis E11.622 Type 2 diabetes mellitus with other skin ulcer E11.610 Type 2 diabetes mellitus with diabetic neuropathic arthropathy  I87.331 Chronic venous hypertension (idiopathic) with ulcer and inflammation of right  lower extremity L03.115 Cellulitis of right lower limb Feldmeier, Olivette C. ([id number]) Electronic Signature(s) Signed: 11/27/2014 2:56:58 PM By: Christin Fudge MD, FACS Entered By: Christin Fudge on 11/27/2014 14:56:58

## 2014-11-28 NOTE — Progress Notes (Signed)
RAYMIE, GORDEN ([id number]) Visit Report for 11/27/2014 Fall Risk Assessment Details Patient Name: [name], [name] Date of Service: 11/27/2014 1:45 PM Medical Record Number: [id number] Patient Account Number: [id number] Date of Birth/Sex: [birth year] (81 y.o. Female) Treating RN: Montey Hora Primary Care Physician: Ria Bush Other Clinician: Referring Physician: Ria Bush Treating Physician/Extender: Frann Rider in Treatment: 14 Fall Risk Assessment Items FALL RISK ASSESSMENT: History of falling - immediate or within 3 months 25 Yes Secondary diagnosis 0 No Ambulatory aid None/bed rest/wheelchair/nurse 0 No Crutches/cane/walker 15 Yes Furniture 0 No IV Access/Saline Lock 0 No Gait/Training Normal/bed rest/immobile 0 Yes Weak 0 No Impaired 0 No Mental Status Oriented to own ability 0 Yes Electronic Signature(s) Signed: 11/27/2014 4:40:04 PM By: Montey Hora Entered By: Montey Hora on 11/27/2014 14:29:13

## 2014-11-28 NOTE — Progress Notes (Signed)
ONEIDA, HEADLEY ([id number]) Visit Report for 11/27/2014 Arrival Information Details Patient Name: [name], [name] Date of Service: 11/27/2014 1:45 PM Medical Record Number: [id number] Patient Account Number: [id number] Date of Birth/Sex: [birth year] (81 y.o. Female) Treating RN: Montey Hora Primary Care Physician: Ria Bush Other Clinician: Referring Physician: Ria Bush Treating Physician/Extender: Frann Rider in Treatment: 14 Visit Information History Since Last Visit Added or deleted any medications: No Patient Arrived: Walker Any new allergies or adverse reactions: No Arrival Time: 14:25 Had a fall or experienced change in Yes Accompanied By: self activities of daily living that may affect Transfer Assistance: None risk of falls: Patient Identification Verified: Yes Signs or symptoms of abuse/neglect since last No Secondary Verification Process Yes visito Completed: Hospitalized since last visit: No Patient Has Alerts: Yes Pain Present Now: No Patient Alerts: Patient on Blood Thinner Type II Diabetic 81 MG aspirin Electronic Signature(s) Signed: 11/27/2014 4:40:04 PM By: Montey Hora Entered By: Montey Hora on 11/27/2014 14:28:57 [name] ([id number]) -------------------------------------------------------------------------------- Encounter Discharge Information Details Patient Name: [name], [name] Date of Service: 11/27/2014 1:45 PM Medical Record Number: [id number] Patient Account Number: [id number] Date of Birth/Sex: 1927-04-10 (81 y.o. Female) Treating RN: Montey Hora Primary Care Physician: Ria Bush Other Clinician: Referring Physician: Ria Bush Treating Physician/Extender: Frann Rider in Treatment: 14 Encounter Discharge Information Items Discharge Pain Level: 0 Discharge Condition: Stable Ambulatory Status: Ambulatory Discharge Destination: Home Transportation: Private  Auto Accompanied By: son Schedule Follow-up Appointment: Yes Medication Reconciliation completed and provided to Patient/Care Yes Barbarita Hutmacher: Provided on Clinical Summary of Care: 11/27/2014 Form Type Recipient Paper Patient LL Electronic Signature(s) Signed: 11/27/2014 3:28:23 PM By: Gretta Cool RN, BSN, Kim RN, BSN Previous Signature: 11/27/2014 2:56:12 PM Version By: Ruthine Dose Entered By: Gretta Cool RN, BSN, Kim on 11/27/2014 14:59:49 [name] ([id number]) -------------------------------------------------------------------------------- Lower Extremity Assessment Details Patient Name: [name], [name] Date of Service: 11/27/2014 1:45 PM Medical Record Number: [id number] Patient Account Number: [id number] Date of Birth/Sex: [birth year] (81 y.o. Female) Treating RN: Montey Hora Primary Care Physician: Ria Bush Other Clinician: Referring Physician: Ria Bush Treating Physician/Extender: Frann Rider in Treatment: 14 Edema Assessment Assessed: [Left: No] [Right: No] E[Left: dema] [Right: :] Calf Left: Right: Point of Measurement: 27 cm From Medial Instep cm 26.6 cm Ankle Left: Right: Point of Measurement: 10 cm From Medial Instep cm 21.3 cm Vascular Assessment Pulses: Posterior Tibial Dorsalis Pedis Palpable: [Right:Yes] Extremity colors, hair growth, and conditions: Extremity Color: [Right:Mottled] Hair Growth on Extremity: [Right:No] Temperature of Extremity: [Right:Warm] Capillary Refill: [Right:< 3 seconds] Toe Nail Assessment Left: Right: Thick: Yes Discolored: Yes Deformed: No Improper Length and Hygiene: No Electronic Signature(s) Signed: 11/27/2014 4:40:04 PM By: Montey Hora Entered By: Montey Hora on 11/27/2014 14:32:17 [name], [name] ([id number]) -------------------------------------------------------------------------------- Multi Wound Chart Details Patient Name: [name], [name] Date of Service: 11/27/2014 1:45  PM Medical Record Number: [id number] Patient Account Number: [id number] Date of Birth/Sex: 1927/12/11 (81 y.o. Female) Treating RN: Montey Hora Primary Care Physician: Ria Bush Other Clinician: Referring Physician: Ria Bush Treating Physician/Extender: Frann Rider in Treatment: 14 Vital Signs Height(in): 58 Pulse(bpm): 74 Weight(lbs): 151 Blood Pressure 142/71 (mmHg): Body Mass Index(BMI): 32 Temperature(F): 98.6 Respiratory Rate 18 (breaths/min): Photos: [1:No Photos] [N/A:N/A] Wound Location: [1:Right Lower Leg - Medial] [N/A:N/A] Wounding Event: [1:Gradually Appeared] [N/A:N/A] Primary Etiology: [1:Venous Leg Ulcer] [N/A:N/A] Comorbid History: [1:Cataracts, Anemia, Coronary Artery Disease, Hypertension, Myocardial Infarction, Type II Diabetes, Osteoarthritis] [N/A:N/A] Date Acquired: [1:04/04/2014] [N/A:N/A] Weeks of Treatment: [1:14] [N/A:N/A] Wound Status: [1:Open] [N/A:N/A]  Measurements L x W x D 4.5x3.8x0.2 [N/A:N/A] (cm) Area (cm) : [1:13.43] [N/A:N/A] Volume (cm) : [1:2.686] [N/A:N/A] % Reduction in Area: [1:-128.00%] [N/A:N/A] % Reduction in Volume: -52.00% [N/A:N/A] Classification: [1:Full Thickness Without Exposed Support Structures] [N/A:N/A] HBO Classification: [1:Grade 1] [N/A:N/A] Exudate Amount: [1:Large] [N/A:N/A] Exudate Type: [1:Serosanguineous] [N/A:N/A] Exudate Color: [1:red, brown] [N/A:N/A] Wound Margin: [1:Flat and Intact] [N/A:N/A] Granulation Amount: [1:Large (67-100%)] [N/A:N/A] Granulation Quality: [1:Red, Pink, Hyper- granulation] [N/A:N/A] Necrotic Amount: [1:None Present (0%)] [N/A:N/A] Exposed Structures: Fascia: No N/A N/A Fat: No Tendon: No Muscle: No Joint: No Bone: No Limited to Skin Breakdown Epithelialization: Small (1-33%) N/A N/A Periwound Skin Texture: Excoriation: Yes N/A N/A Edema: No Induration: No Callus: No Crepitus: No Fluctuance: No Friable: No Rash: No Scarring: No Periwound  Skin Maceration: Yes N/A N/A Moisture: Moist: Yes Dry/Scaly: No Periwound Skin Color: Erythema: Yes N/A N/A Atrophie Blanche: No Cyanosis: No Ecchymosis: No Hemosiderin Staining: No Mottled: No Pallor: No Rubor: No Erythema Location: Circumferential N/A N/A Temperature: No Abnormality N/A N/A Tenderness on Yes N/A N/A Palpation: Wound Preparation: Ulcer Cleansing: Other: N/A N/A soap and water Topical Anesthetic Applied: None, Other: lidocaine4% Treatment Notes Electronic Signature(s) Signed: 11/27/2014 4:40:04 PM By: Montey Hora Entered By: Montey Hora on 11/27/2014 14:34:46 Marovich, [name] ([id number]) -------------------------------------------------------------------------------- [place] Details Patient Name: [name], [name] Date of Service: 11/27/2014 1:45 PM Medical Record Number: [id number] Patient Account Number: [id number] Date of Birth/Sex: 04-16-1927 (81 y.o. Female) Treating RN: Montey Hora Primary Care Physician: Ria Bush Other Clinician: Referring Physician: Ria Bush Treating Physician/Extender: Frann Rider in Treatment: 14 Active Inactive Abuse / Safety / Falls / Self Care Management Nursing Diagnoses: Potential for falls Goals: Patient will remain injury free Date Initiated: 08/15/2014 Goal Status: Active Interventions: Assess fall risk on admission and as needed Notes: Nutrition Nursing Diagnoses: Impaired glucose control: actual or potential Goals: Patient/caregiver verbalizes understanding of need to maintain therapeutic glucose control per primary care physician Date Initiated: 08/15/2014 Goal Status: Active Interventions: Assess patient nutrition upon admission and as needed per policy Notes: Orientation to the Wound Care Program Nursing Diagnoses: Knowledge deficit related to the wound healing center program Goals: Patient/caregiver will verbalize understanding of the [name], [name] ([id number]) Date Initiated: 08/15/2014 Goal Status: Active Interventions: Provide education on orientation to the wound center Notes: Venous Leg Ulcer Nursing Diagnoses: Actual venous Insuffiency (use after diagnosis is confirmed) Goals: Patient will maintain optimal edema control Date Initiated: 08/15/2014 Goal Status: Active Interventions: Compression as ordered Treatment Activities: Test ordered outside of clinic : 11/27/2014 Notes: Wound/Skin Impairment Nursing Diagnoses: Impaired tissue integrity Goals: Patient/caregiver will verbalize understanding of skin care regimen Date Initiated: 08/15/2014 Goal Status: Active Interventions: Assess patient/caregiver ability to obtain necessary supplies Notes: Electronic Signature(s) Signed: 11/27/2014 4:40:04 PM By: Montey Hora Entered By: Montey Hora on 11/27/2014 14:34:39 [name], [name] ([id number]) -------------------------------------------------------------------------------- Patient/Caregiver Education Details Patient Name: Harriss, Latanza C. Date of Service: 11/27/2014 1:45 PM Medical Record Number: [id number] Patient Account Number: [id number] Date of Birth/Gender: [birth year] (81 y.o. Female) Treating RN: Cornell Barman Primary Care Physician: Ria Bush Other Clinician: Referring Physician: Ria Bush Treating Physician/Extender: Frann Rider in Treatment: 14 Education Assessment Education Provided To: Patient Education Topics Provided Wound/Skin Impairment: Handouts: Caring for Your Ulcer, Other: continue wound care as prescribed Methods: Demonstration Responses: State content correctly Electronic Signature(s) Signed: 11/27/2014 3:28:23 PM By: Gretta Cool, RN, BSN, Kim RN, BSN Entered By: Gretta Cool, RN, BSN, Kim on 11/27/2014 15:00:13 Diego, [name] ([id number]) --------------------------------------------------------------------------------  Wound  Assessment Details Patient Name: [name], [name] Date of Service: 11/27/2014 1:45 PM Medical Record Number: [id number] Patient Account Number: [id number] Date of Birth/Sex: [birth year] (81 y.o. Female) Treating RN: Montey Hora Primary Care Physician: Ria Bush Other Clinician: Referring Physician: Ria Bush Treating Physician/Extender: Frann Rider in Treatment: 14 Wound Status Wound Number: 1 Primary Venous Leg Ulcer Etiology: Wound Location: Right Lower Leg - Medial Wound Open Wounding Event: Gradually Appeared Status: Date Acquired: 04/04/2014 Comorbid Cataracts, Anemia, Coronary Artery Weeks Of Treatment: 14 History: Disease, Hypertension, Myocardial Clustered Wound: No Infarction, Type II Diabetes, Osteoarthritis Photos Photo Uploaded By: Gretta Cool, RN, BSN, Kim on 11/27/2014 15:22:52 Wound Measurements Length: (cm) 4.5 Width: (cm) 3.8 Depth: (cm) 0.2 Area: (cm) 13.43 Volume: (cm) 2.686 % Reduction in Area: -128% % Reduction in Volume: -52% Epithelialization: Small (1-33%) Wound Description Full Thickness Without Exposed Foul Odor A Classification: Support Structures Diabetic Severity Grade 1 (Wagner): Wound Margin: Flat and Intact Exudate Amount: Large Exudate Type: Serosanguineous Exudate Color: red, brown fter Cleansing: No Wound Bed [name], [name] ([id number]) Granulation Amount: Large (67-100%) Exposed Structure Granulation Quality: Red, Pink, Hyper-granulation Fascia Exposed: No Necrotic Amount: None Present (0%) Fat Layer Exposed: No Tendon Exposed: No Muscle Exposed: No Joint Exposed: No Bone Exposed: No Limited to Skin Breakdown Periwound Skin Texture Texture Color No Abnormalities Noted: No No Abnormalities Noted: No Callus: No Atrophie Blanche: No Crepitus: No Cyanosis: No Excoriation: Yes Ecchymosis: No Fluctuance: No Erythema: Yes Friable: No Erythema Location: Circumferential Induration:  No Hemosiderin Staining: No Localized Edema: No Mottled: No Rash: No Pallor: No Scarring: No Rubor: No Moisture Temperature / Pain No Abnormalities Noted: No Temperature: No Abnormality Dry / Scaly: No Tenderness on Palpation: Yes Maceration: Yes Moist: Yes Wound Preparation Ulcer Cleansing: Other: soap and water, Topical Anesthetic Applied: None, Other: lidocaine4%, Treatment Notes Wound #1 (Right, Medial Lower Leg) 1. Cleansed with: Clean wound with Normal Saline 2. Anesthetic Topical Lidocaine 4% cream to wound bed prior to debridement 7. Secured with 2 Layer Lite Compression System - Right Lower Extremity Notes Xtra sorb, Drawtex and over Apligraf; mepitel lite over heel to prevent blistering. Electronic Signature(s) Signed: 11/27/2014 4:40:04 PM By: Montey Hora Entered By: Montey Hora on 11/27/2014 14:33:28 [name], [name] ([id number]) [name], [name] ([id number]) -------------------------------------------------------------------------------- Vitals Details Patient Name: [name], [name] Date of Service: 11/27/2014 1:45 PM Medical Record Number: [id number] Patient Account Number: [id number] Date of Birth/Sex: [birth year] (81 y.o. Female) Treating RN: Montey Hora Primary Care Physician: Ria Bush Other Clinician: Referring Physician: Ria Bush Treating Physician/Extender: Frann Rider in Treatment: 14 Vital Signs Time Taken: 14:28 Temperature (F): 98.6 Height (in): 58 Pulse (bpm): 74 Weight (lbs): 151 Respiratory Rate (breaths/min): 18 Body Mass Index (BMI): 31.6 Blood Pressure (mmHg): 142/71 Reference Range: 80 - 120 mg / dl Electronic Signature(s) Signed: 11/27/2014 4:40:04 PM By: Montey Hora Entered By: Montey Hora on 11/27/2014 14:28:49

## 2014-12-01 ENCOUNTER — Ambulatory Visit: Payer: Medicare Other | Admitting: Endocrinology

## 2014-12-02 ENCOUNTER — Telehealth: Payer: Self-pay

## 2014-12-02 NOTE — Telephone Encounter (Signed)
Pt called checking on rx  Wanting to know when it would be ready

## 2014-12-02 NOTE — Telephone Encounter (Signed)
Pt left v/m requesting rx hydrocodone apap. Call when ready for pick up.rx last printed # 60 on 11/05/14; pt last seen 09/02/14.

## 2014-12-03 MED ORDER — HYDROCODONE-ACETAMINOPHEN 5-325 MG PO TABS: 0.5000 | ORAL_TABLET | Freq: Four times a day (QID) | ORAL | Status: DC | PRN

## 2014-12-03 NOTE — Telephone Encounter (Signed)
Patient called to find out if her rx is ready.  [place].  Please call patient after 11:30.  If patient doesn't answer,call her back.

## 2014-12-03 NOTE — Telephone Encounter (Signed)
Printed and in Kim's box 

## 2014-12-03 NOTE — Telephone Encounter (Signed)
Patient notified and Rx placed up front for up.

## 2014-12-05 ENCOUNTER — Encounter: Payer: Medicare Other | Attending: Surgery | Admitting: Surgery

## 2014-12-05 DIAGNOSIS — L03115 Cellulitis of right lower limb: Secondary | ICD-10-CM | POA: Insufficient documentation

## 2014-12-05 DIAGNOSIS — I10 Essential (primary) hypertension: Secondary | ICD-10-CM | POA: Diagnosis not present

## 2014-12-05 DIAGNOSIS — I87331 Chronic venous hypertension (idiopathic) with ulcer and inflammation of right lower extremity: Secondary | ICD-10-CM | POA: Diagnosis not present

## 2014-12-05 DIAGNOSIS — E11622 Type 2 diabetes mellitus with other skin ulcer: Secondary | ICD-10-CM | POA: Insufficient documentation

## 2014-12-05 DIAGNOSIS — E11610 Type 2 diabetes mellitus with diabetic neuropathic arthropathy: Secondary | ICD-10-CM | POA: Insufficient documentation

## 2014-12-05 DIAGNOSIS — L97211 Non-pressure chronic ulcer of right calf limited to breakdown of skin: Secondary | ICD-10-CM | POA: Diagnosis not present

## 2014-12-06 NOTE — Progress Notes (Signed)
[name], [name] ([id number]) Visit Report for 12/05/2014 Arrival Information Details Patient Name: [name], [name] Date of Service: 12/05/2014 2:00 PM Medical Record Number: [id number] Patient Account Number: [id number] Date of Birth/Sex: Aug 02, 1927 (81 y.o. Female) Treating RN: Montey Hora Primary Care Physician: Ria Bush Other Clinician: Referring Physician: Ria Bush Treating Physician/Extender: Frann Rider in Treatment: 50 Visit Information History Since Last Visit Added or deleted any medications: No Patient Arrived: Walker Any new allergies or adverse reactions: No Arrival Time: 14:05 Had a fall or experienced change in No Accompanied By: self activities of daily living that may affect Transfer Assistance: None risk of falls: Patient Identification Verified: Yes Signs or symptoms of abuse/neglect since last No Secondary Verification Process Yes visito Completed: Hospitalized since last visit: No Patient Has Alerts: Yes Pain Present Now: Yes Patient Alerts: Patient on Blood Thinner Type II Diabetic 81 MG aspirin Electronic Signature(s) Signed: 12/05/2014 5:09:34 PM By: Montey Hora Entered By: Montey Hora on 12/05/2014 14:07:14 Grayer, [name] ([id number]) -------------------------------------------------------------------------------- Encounter Discharge Information Details Patient Name: [name], [name] Date of Service: 12/05/2014 2:00 PM Medical Record Number: [id number] Patient Account Number: [id number] Date of Birth/Sex: 24-Jul-1927 (81 y.o. Female) Treating RN: Montey Hora Primary Care Physician: Ria Bush Other Clinician: Referring Physician: Ria Bush Treating Physician/Extender: Frann Rider in Treatment: 16 Encounter Discharge Information Items Discharge Pain Level: 0 Discharge Condition: Stable Ambulatory Status: Walker Discharge Destination: Home Transportation: Private Auto Accompanied By:  self Schedule Follow-up Appointment: Yes Medication Reconciliation completed No and provided to Patient/Care Gabrial Domine: Provided on Clinical Summary of Care: 12/05/2014 Form Type Recipient Paper Patient LL Electronic Signature(s) Signed: 12/05/2014 2:40:17 PM By: Ruthine Dose Entered By: Ruthine Dose on 12/05/2014 14:40:17 Mantione, [name] ([id number]) -------------------------------------------------------------------------------- Lower Extremity Assessment Details Patient Name: [name], [name] Date of Service: 12/05/2014 2:00 PM Medical Record Number: [id number] Patient Account Number: [id number] Date of Birth/Sex: 1927/10/14 (81 y.o. Female) Treating RN: Montey Hora Primary Care Physician: Ria Bush Other Clinician: Referring Physician: Ria Bush Treating Physician/Extender: Frann Rider in Treatment: 16 Edema Assessment Assessed: [Left: No] [Right: No] E[Left: dema] [Right: :] Calf Left: Right: Point of Measurement: 27 cm From Medial Instep cm 26.4 cm Ankle Left: Right: Point of Measurement: 10 cm From Medial Instep cm 20.7 cm Vascular Assessment Pulses: Posterior Tibial Dorsalis Pedis Palpable: [Right:Yes] Extremity colors, hair growth, and conditions: Extremity Color: [Right:Mottled] Hair Growth on Extremity: [Right:No] Temperature of Extremity: [Right:Warm] Capillary Refill: [Right:< 3 seconds] Toe Nail Assessment Left: Right: Thick: Yes Discolored: Yes Deformed: Yes Improper Length and Hygiene: No Electronic Signature(s) Signed: 12/05/2014 5:09:34 PM By: Montey Hora Entered By: Montey Hora on 12/05/2014 14:17:40 Bergevin, [name] ([id number]) -------------------------------------------------------------------------------- Multi Wound Chart Details Patient Name: [name], [name] Date of Service: 12/05/2014 2:00 PM Medical Record Number: [id number] Patient Account Number: [id number] Date of Birth/Sex: 1928-03-12 (81 y.o.  Female) Treating RN: Montey Hora Primary Care Physician: Ria Bush Other Clinician: Referring Physician: Ria Bush Treating Physician/Extender: Frann Rider in Treatment: 16 Vital Signs Height(in): 58 Pulse(bpm): 75 Weight(lbs): 151 Blood Pressure 128/52 (mmHg): Body Mass Index(BMI): 32 Temperature(F): 98.3 Respiratory Rate 18 (breaths/min): Wound Assessments Treatment Notes Electronic Signature(s) Signed: 12/05/2014 5:09:34 PM By: Montey Hora Entered By: Montey Hora on 12/05/2014 14:18:38 Gabor, [name] ([id number]) -------------------------------------------------------------------------------- [place] Details Patient Name: [name], [name] Date of Service: 12/05/2014 2:00 PM Medical Record Number: [id number] Patient Account Number: [id number] Date of Birth/Sex: 1927-11-14 (81 y.o. Female) Treating RN: Montey Hora Primary Care Physician: Ria Bush Other Clinician:  Referring Physician: Ria Bush Treating Physician/Extender: Frann Rider in Treatment: 16 Active Inactive Abuse / Safety / Falls / Self Care Management Nursing Diagnoses: Potential for falls Goals: Patient will remain injury free Date Initiated: 08/15/2014 Goal Status: Active Interventions: Assess fall risk on admission and as needed Notes: Nutrition Nursing Diagnoses: Impaired glucose control: actual or potential Goals: Patient/caregiver verbalizes understanding of need to maintain therapeutic glucose control per primary care physician Date Initiated: 08/15/2014 Goal Status: Active Interventions: Assess patient nutrition upon admission and as needed per policy Notes: Orientation to the Wound Care Program Nursing Diagnoses: Knowledge deficit related to the wound healing center program Goals: Patient/caregiver will verbalize understanding of the [place] [name], [name] Date  Initiated: 08/15/2014 Goal Status: Active Interventions: Provide education on orientation to the wound center Notes: Venous Leg Ulcer Nursing Diagnoses: Actual venous Insuffiency (use after diagnosis is confirmed) Goals: Patient will maintain optimal edema control Date Initiated: 08/15/2014 Goal Status: Active Interventions: Compression as ordered Treatment Activities: Test ordered outside of clinic : 12/05/2014 Notes: Wound/Skin Impairment Nursing Diagnoses: Impaired tissue integrity Goals: Patient/caregiver will verbalize understanding of skin care regimen Date Initiated: 08/15/2014 Goal Status: Active Interventions: Assess patient/caregiver ability to obtain necessary supplies Notes: Electronic Signature(s) Signed: 12/05/2014 5:09:34 PM By: Montey Hora Entered By: Montey Hora on 12/05/2014 14:18:27 Rumbold, Dejah C. ([id number]) -------------------------------------------------------------------------------- Pain Assessment Details Patient Name: Garbutt, Danett C. Date of Service: 12/05/2014 2:00 PM Medical Record Number: [id number] Patient Account Number: [id number] Date of Birth/Sex: [birth year] (81 y.o. Female) Treating RN: Montey Hora Primary Care Physician: Ria Bush Other Clinician: Referring Physician: Ria Bush Treating Physician/Extender: Frann Rider in Treatment: 16 Active Problems Location of Pain Severity and Description of Pain Patient Has [name] Yes Site Locations Pain Location: Pain in Ulcers With Dressing Change: Yes Duration of the Pain. Constant / Intermittento Constant Character of Pain Describe the Pain: Aching, Dull Pain Management and Medication Current Pain Management: Electronic Signature(s) Signed: 12/05/2014 5:09:34 PM By: Montey Hora Entered By: Montey Hora on 12/05/2014 14:07:30 Cutsforth, [name]  ([id number]) -------------------------------------------------------------------------------- Patient/Caregiver Education Details Patient Name: Kueker, Rendy C. Date of Service: 12/05/2014 2:00 PM Medical Record Number: [id number] Patient Account Number: [id number] Date of Birth/Gender: Jun 01, 1927 (81 y.o. Female) Treating RN: Montey Hora Primary Care Physician: Ria Bush Other Clinician: Referring Physician: Ria Bush Treating Physician/Extender: Frann Rider in Treatment: 16 Education Assessment Education Provided To: Patient Education Topics Provided Wound/Skin Impairment: Handouts: Other: wound healing Methods: Explain/Verbal Responses: State content correctly Electronic Signature(s) Signed: 12/05/2014 5:09:34 PM By: Montey Hora Entered By: Montey Hora on 12/05/2014 14:36:57 Babineaux, [name] ([id number]) -------------------------------------------------------------------------------- [place] Details Patient Name: Hoopingarner, Dejae C. Date of Service: 12/05/2014 2:00 PM Medical Record Number: [id number] Patient Account Number: [id number] Date of Birth/Sex: September 19, 1927 (81 y.o. Female) Treating RN: Montey Hora Primary Care Physician: Ria Bush Other Clinician: Referring Physician: Ria Bush Treating Physician/Extender: Frann Rider in Treatment: 16 Vital Signs Time Taken: 14:07 Temperature (F): 98.3 Height (in): 58 Pulse (bpm): 75 Weight (lbs): 151 Respiratory Rate (breaths/min): 18 Body Mass Index (BMI): 31.6 Blood Pressure (mmHg): 128/52 Reference Range: 80 - 120 mg / dl Electronic Signature(s) Signed: 12/05/2014 5:09:34 PM By: Montey Hora Entered By: Montey Hora on 12/05/2014 14:09:30

## 2014-12-06 NOTE — Progress Notes (Signed)
ROMANI, DEFIORE ([id number]) Visit Report for 12/05/2014 Chief Complaint Document Details Patient Name: [name], [name] Date of Service: 12/05/2014 2:00 PM Medical Record Number: [id number] Patient Account Number: [id number] Date of Birth/Sex: December 15, 1927 (81 y.o. Female) Treating RN: Cornell Barman Primary Care Physician: Ria Bush Other Clinician: Referring Physician: Ria Bush Treating Physician/Extender: Frann Rider in Treatment: 16 Information Obtained from: Patient Chief Complaint Patient presents for treatment of an open ulcer due to venous insufficiency. 81 year old patient who is known to me from the [facility] wound care center now comes in follow up here for a chronic venous ulceration of her right lower extremity which she's had for about 6 months. Electronic Signature(s) Signed: 12/05/2014 2:38:46 PM By: Christin Fudge MD, FACS Entered By: Christin Fudge on 12/05/2014 14:38:46 Linker, [name] ([id number]) -------------------------------------------------------------------------------- HPI Details Patient Name: [name], [name] Date of Service: 12/05/2014 2:00 PM Medical Record Number: [id number] Patient Account Number: [id number] Date of Birth/Sex: Oct 03, 1927 (81 y.o. Female) Treating RN: Cornell Barman Primary Care Physician: Ria Bush Other Clinician: Referring Physician: Ria Bush Treating Physician/Extender: Frann Rider in Treatment: 16 History of Present Illness Location: ulcer in the right lower extremity for several months Quality: Patient reports experiencing a dull pain to affected area(s). Severity: Patient states wound (s) are getting better. Duration: Patient has had the wound for > 3 months prior to seeking treatment at the wound center Timing: Pain in wound is Intermittent (comes and goes Context: The wound appeared gradually over time Modifying Factors: Consults to this date include: vascular surgery for the veins in  December 2015 and she's been seeing the wound care center at [facility] for several months. Associated Signs and Symptoms: Patient reports having difficulty standing for long periods. HPI Description: This is an 81 yrs old female we have been following since the beginning of January for a wound on the right medial ankle. She had previously had venous ablation of the greater saphenous vein by Dr. Kellie Simmering. She came to [place] with erythema of the right leg and subsequently is opened in the medial aspect of the right ankle. She has not been very compliant with compression. We ordered her at juxta lite stocking, however she apparently has not been able to wear it. The area on the medial aspect of her left ankle has much less induration and erythema. The edema is better controlled. No evidence of infection is noted. 07/18/14; no major change in the condition of this wound. There is still surface slough and probably some drainage. 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 All other veins showed no evidence of DVT, superficial venous thrombosis or incompetence. however she does have manifestations of chronic venous hypertension and I believe we will change her dressing over to Prisma and a Profore light dressing. she sees her primary care doctor regularly and her last hemoglobin A1c has been around 7%.her other comorbidities are hypertension, CABG in 1998, peptic ulcer disease, history of echo with a ejection fraction of 55-60% and grade 1 diastolic dysfunction. past surgical history include coronary artery bypass graft in 1998, cholecystectomy, appendectomy, hand surgery, endovenous ablation of the saphenous vein with laser by Dr. Kellie Simmering in December 2015. 08/29/2014 - she complains of increased calf pain over the past week. removed her compression bandage and pain improved. no fever or  chills. Mild clear drainage. 09/04/2014 -- she has not been compliant and  has not done a dressing change as we had ordered last week. She always complains and does not keep her wrap on and this past week she has done the same. She has completed a course of doxycycline. 09/25/2014 -- she has tolerated her dressing all week and has not removed it. She says it's hurting a lot today and would not like any debridement. We will do so as per her wishes. she did get a appointment with the vascular surgeon and sees him on 6/28. [name], [name] ([id number]) 10/03/2014 - She was seen by Dr. Tinnie Gens 09/30/2014. Assessment: I reviewed the previous venous duplex exam study performed in September 2015 which reveals no options for treating this venous ulcer. There is no significant superficial venous reflux to treat with laser ablation.Most recent ABI was 0.91 in the right foot with palpable dorsalis pedis pulse at 3+ Plan: patient needs to return to the [facility] wound center and be treated by Dr. Con Memos. No arterial or venous options available from vascular standpoint or treatment of right leg stasis ulcer.Continued dressing changes and compression is the treatment of choice 10/16/2014 -- today will be her first application of Apligraf. 10/23/2014 -- we are going to be able to apply her second day of Apligraf next week. She is complaining of some tenderness in the region of Achilles tendon and this is possibly due to her wrap. 11/06/2014 -- she did not have Apligraf last week because the wound was not looking good but she says she is feeling much better now. 11/13/2014 -- she has been doing well this week and she is here for her second Apligraf. 11/27/2014 -- she is here for her third application of Apligraf Electronic Signature(s) Signed: 12/05/2014 2:38:51 PM By: Christin Fudge MD, FACS Entered By: Christin Fudge on 12/05/2014 14:38:51 Bollman, [name]  ([id number]) -------------------------------------------------------------------------------- Physical Exam Details Patient Name: [name], [name] Date of Service: 12/05/2014 2:00 PM Medical Record Number: [id number] Patient Account Number: [id number] Date of Birth/Sex: 06-04-1927 (81 y.o. Female) Treating RN: Cornell Barman Primary Care Physician: Ria Bush Other Clinician: Referring Physician: Ria Bush Treating Physician/Extender: Frann Rider in Treatment: 16 Constitutional . Pulse regular. Respirations normal and unlabored. Afebrile. . Eyes Nonicteric. Reactive to light. Ears, Nose, Mouth, and Throat Lips, teeth, and gums WNL.Marland Kitchen Moist mucosa without lesions . Neck supple and nontender. No palpable supraclavicular or cervical adenopathy. Normal sized without goiter. Respiratory WNL. No retractions.. Cardiovascular Pedal Pulses WNL. No clubbing, cyanosis or edema. Lymphatic No adneopathy. No adenopathy. No adenopathy. Musculoskeletal Adexa without tenderness or enlargement.. Digits and nails w/o clubbing, cyanosis, infection, petechiae, ischemia, or inflammatory conditions.. Integumentary (Hair, Skin) No suspicious lesions. No crepitus or fluctuance. No peri-wound warmth or erythema. No masses.Marland Kitchen Psychiatric Judgement and insight Intact.. No evidence of depression, anxiety, or agitation.. Notes the Apligraf is in place but the dressing was fairly dry today and the skin surrounding it looks good and there is minimal edema. Electronic Signature(s) Signed: 12/05/2014 2:39:20 PM By: Christin Fudge MD, FACS Entered By: Christin Fudge on 12/05/2014 14:39:20 Stegmaier, [name] ([id number]) -------------------------------------------------------------------------------- Physician Orders Details Patient Name: [name], [name] Date of Service: 12/05/2014 2:00 PM Medical Record Number: [id number] Patient Account Number: [id number] Date of Birth/Sex: 01-07-1928 (81 y.o.  Female) Treating RN: Montey Hora Primary Care Physician: Ria Bush Other Clinician: Referring Physician: Ria Bush Treating Physician/Extender: Frann Rider in Treatment: 16 Verbal / Phone Orders: Yes Clinician: Montey Hora Read Back and Verified: Yes Diagnosis Coding Wound Cleansing Wound #1 Right,Medial Lower  Leg o Cleanse wound with mild soap and water Anesthetic Wound #1 Right,Medial Lower Leg o Topical Lidocaine 4% cream applied to wound bed prior to debridement Skin Barriers/Peri-Wound Care Wound #1 Right,Medial Lower Leg o Skin Prep Primary Wound Dressing Wound #1 Right,Medial Lower Leg o Drawtex Secondary Dressing Wound #1 Right,Medial Lower Leg o ABD pad Dressing Change Frequency Wound #1 Right,Medial Lower Leg o Change dressing every week Follow-up Appointments Wound #1 Right,Medial Lower Leg o Return Appointment in 1 week. Edema Control Wound #1 Right,Medial Lower Leg o 2 Layer Lite Compression System - Right Lower Extremity Additional Orders / Instructions Wound #1 Right,Medial Lower Leg Britain, Baylie C. ([id number]) o Increase protein intake. Electronic Signature(s) Signed: 12/05/2014 4:20:06 PM By: Christin Fudge MD, FACS Signed: 12/05/2014 5:09:34 PM By: Montey Hora Entered By: Montey Hora on 12/05/2014 14:23:48 Sandefer, [name] ([id number]) -------------------------------------------------------------------------------- Problem List Details Patient Name: [name], [name] Date of Service: 12/05/2014 2:00 PM Medical Record Number: [id number] Patient Account Number: [id number] Date of Birth/Sex: 1928-03-23 (81 y.o. Female) Treating RN: Cornell Barman Primary Care Physician: Ria Bush Other Clinician: Referring Physician: Ria Bush Treating Physician/Extender: Frann Rider in Treatment: 16 Active Problems ICD-10 Encounter Code Description Active Date Diagnosis E11.622 Type 2  diabetes mellitus with other skin ulcer 08/15/2014 Yes E11.610 Type 2 diabetes mellitus with diabetic neuropathic 08/15/2014 Yes arthropathy I87.331 Chronic venous hypertension (idiopathic) with ulcer and 08/15/2014 Yes inflammation of right lower extremity L03.115 Cellulitis of right lower limb 08/29/2014 Yes Inactive Problems Resolved Problems Electronic Signature(s) Signed: 12/05/2014 2:38:41 PM By: Christin Fudge MD, FACS Entered By: Christin Fudge on 12/05/2014 14:38:41 [name], [name] ([id number]) -------------------------------------------------------------------------------- Progress Note Details Patient Name: [name], [name] Date of Service: 12/05/2014 2:00 PM Medical Record Number: [id number] Patient Account Number: [id number] Date of Birth/Sex: 01-14-1928 (81 y.o. Female) Treating RN: Cornell Barman Primary Care Physician: Ria Bush Other Clinician: Referring Physician: Ria Bush Treating Physician/Extender: Frann Rider in Treatment: 16 Subjective Chief Complaint Information obtained from Patient Patient presents for treatment of an open ulcer due to venous insufficiency. 81 year old patient who is known to me from the [facility] wound care center now comes in follow up here for a chronic venous ulceration of her right lower extremity which she's had for about 6 months. History of Present Illness (HPI) The following HPI elements were documented for the patient's wound: Location: ulcer in the right lower extremity for several months Quality: Patient reports experiencing a dull pain to affected area(s). Severity: Patient states wound (s) are getting better. Duration: Patient has had the wound for > 3 months prior to seeking treatment at the wound center Timing: Pain in wound is Intermittent (comes and goes Context: The wound appeared gradually over time Modifying Factors: Consults to this date include: vascular surgery for the veins in December 2015 and  she's been seeing the wound care center at [facility] for several months. Associated Signs and Symptoms: Patient reports having difficulty standing for long periods. This is an 81 yrs old female we have been following since the beginning of January for a wound on the right medial ankle. She had previously had venous ablation of the greater saphenous vein by Dr. Kellie Simmering. She came to [place] with erythema of the right leg and subsequently is opened in the medial aspect of the right ankle. She has not been very compliant with compression. We ordered her at juxta lite stocking, however she apparently has not been able to wear it. The area on the medial aspect of her  left ankle has much less induration and erythema. The edema is better controlled. No evidence of infection is noted. 07/18/14; no major change in the condition of this wound. There is still surface slough and probably some drainage. 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 All other veins showed no evidence of DVT, superficial venous thrombosis or incompetence. however she does have manifestations of chronic venous hypertension and I believe we will change her dressing over to Prisma and a Profore light dressing. she sees her primary care doctor regularly and her last hemoglobin A1c has been around 7%.her other comorbidities are hypertension, CABG in 1998, peptic ulcer disease, history of echo with a ejection fraction of 55-60% and grade 1 diastolic dysfunction. past surgical history include coronary artery bypass graft in 1998, cholecystectomy, appendectomy, hand surgery, endovenous ablation of the saphenous vein with laser by Dr. Kellie Simmering in December 2015. [name], [name] 08/29/2014 - she complains of increased calf pain over the past week. removed her compression bandage and pain improved. no fever or  chills. Mild clear drainage. 09/04/2014 -- she has not been compliant and has not done a dressing change as we had ordered last week. She always complains and does not keep her wrap on and this past week she has done the same. She has completed a course of doxycycline. 09/25/2014 -- she has tolerated her dressing all week and has not removed it. She says it's hurting a lot today and would not like any debridement. We will do so as per her wishes. she did get a appointment with the vascular surgeon and sees him on 6/28. 10/03/2014 - She was seen by Dr. Tinnie Gens 09/30/2014. Assessment: I reviewed the previous venous duplex exam study performed in September 2015 which reveals no options for treating this venous ulcer. There is no significant superficial venous reflux to treat with laser ablation.Most recent ABI was 0.91 in the right foot with palpable dorsalis pedis pulse at 3+ Plan: patient needs to return to the [facility] wound center and be treated by Dr. Con Memos. No arterial or venous options available from vascular standpoint or treatment of right leg stasis ulcer.Continued dressing changes and compression is the treatment of choice 10/16/2014 -- today will be her first application of Apligraf. 10/23/2014 -- we are going to be able to apply her second day of Apligraf next week. She is complaining of some tenderness in the region of Achilles tendon and this is possibly due to her wrap. 11/06/2014 -- she did not have Apligraf last week because the wound was not looking good but she says she is feeling much better now. 11/13/2014 -- she has been doing well this week and she is here for her second Apligraf. 11/27/2014 -- she is here for her third application of Apligraf Objective Constitutional Pulse regular. Respirations normal and unlabored. Afebrile. Vitals Time Taken: 2:07 PM, Height: 58 in, Weight: 151 lbs, BMI: 31.6, Temperature: 98.3 F, Pulse: 75 bpm, Respiratory Rate: 18  breaths/min, Blood Pressure: 128/52 mmHg. Eyes Nonicteric. Reactive to light. Ears, Nose, Mouth, and Throat Lips, teeth, and gums WNL.Marland Kitchen Moist mucosa without lesions . Neck supple and nontender. No palpable supraclavicular or cervical adenopathy. Normal sized without goiter. [name], [name] Respiratory WNL. No retractions.. Cardiovascular Pedal Pulses WNL. No clubbing, cyanosis or edema. Lymphatic No adneopathy. No adenopathy. No adenopathy. Musculoskeletal Adexa without tenderness or  enlargement.. Digits and nails w/o clubbing, cyanosis, infection, petechiae, ischemia, or inflammatory conditions.Marland Kitchen Psychiatric Judgement and insight Intact.. No evidence of depression, anxiety, or agitation.. General Notes: the Apligraf is in place but the dressing was fairly dry today and the skin surrounding it looks good and there is minimal edema. Integumentary (Hair, Skin) No suspicious lesions. No crepitus or fluctuance. No peri-wound warmth or erythema. No masses.. Assessment Active Problems ICD-10 E11.622 - Type 2 diabetes mellitus with other skin ulcer E11.610 - Type 2 diabetes mellitus with diabetic neuropathic arthropathy I87.331 - Chronic venous hypertension (idiopathic) with ulcer and inflammation of right lower extremity L03.115 - Cellulitis of right lower limb We will apply an appropriate bolster and a 2 layer compression wrap and see her back next week for her next application of Apligraf. Plan Wound Cleansing: Wound #1 Right,Medial Lower Leg: Cleanse wound with mild soap and water [name], [name] ([id number]) Anesthetic: Wound #1 Right,Medial Lower Leg: Topical Lidocaine 4% cream applied to wound bed prior to debridement Skin Barriers/Peri-Wound Care: Wound #1 Right,Medial Lower Leg: Skin Prep Primary Wound Dressing: Wound #1 Right,Medial Lower Leg: Drawtex Secondary Dressing: Wound #1 Right,Medial Lower Leg: ABD pad Dressing Change Frequency: Wound #1  Right,Medial Lower Leg: Change dressing every week Follow-up Appointments: Wound #1 Right,Medial Lower Leg: Return Appointment in 1 week. Edema Control: Wound #1 Right,Medial Lower Leg: 2 Layer Lite Compression System - Right Lower Extremity Additional Orders / Instructions: Wound #1 Right,Medial Lower Leg: Increase protein intake. We will apply an appropriate bolster and a 2 layer compression wrap and see her back next week for her next application of Apligraf. Electronic Signature(s) Signed: 12/05/2014 2:40:03 PM By: Christin Fudge MD, FACS Entered By: Christin Fudge on 12/05/2014 14:40:03 [name], [name] ([id number]) -------------------------------------------------------------------------------- SuperBill Details Patient Name: [name], Norely C. Date of Service: 12/05/2014 Medical Record Number: [id number] Patient Account Number: [id number] Date of Birth/Sex: 1928-02-15 (81 y.o. Female) Treating RN: Cornell Barman Primary Care Physician: Ria Bush Other Clinician: Referring Physician: Ria Bush Treating Physician/Extender: Frann Rider in Treatment: 16 Diagnosis Coding ICD-10 Codes Code Description E11.622 Type 2 diabetes mellitus with other skin ulcer E11.610 Type 2 diabetes mellitus with diabetic neuropathic arthropathy Chronic venous hypertension (idiopathic) with ulcer and inflammation of right lower I87.331 extremity L03.115 Cellulitis of right lower limb Physician Procedures CPT4: Description Modifier Quantity Code [id number] - WC PHYS LEVEL 3 - EST PT 1 ICD-10 Description Diagnosis E11.622 Type 2 diabetes mellitus with other skin ulcer I87.331 Chronic venous hypertension (idiopathic) with ulcer and inflammation of right  lower extremity E11.610 Type 2 diabetes mellitus with diabetic neuropathic arthropathy Electronic Signature(s) Signed: 12/05/2014 2:40:20 PM By: Christin Fudge MD, FACS Entered By: Christin Fudge on 12/05/2014 14:40:20

## 2014-12-12 ENCOUNTER — Encounter: Payer: Medicare Other | Admitting: Surgery

## 2014-12-12 DIAGNOSIS — I87331 Chronic venous hypertension (idiopathic) with ulcer and inflammation of right lower extremity: Secondary | ICD-10-CM | POA: Diagnosis not present

## 2014-12-12 DIAGNOSIS — L97211 Non-pressure chronic ulcer of right calf limited to breakdown of skin: Secondary | ICD-10-CM | POA: Diagnosis not present

## 2014-12-12 DIAGNOSIS — E11610 Type 2 diabetes mellitus with diabetic neuropathic arthropathy: Secondary | ICD-10-CM | POA: Diagnosis not present

## 2014-12-12 DIAGNOSIS — E11622 Type 2 diabetes mellitus with other skin ulcer: Secondary | ICD-10-CM | POA: Diagnosis not present

## 2014-12-12 DIAGNOSIS — I10 Essential (primary) hypertension: Secondary | ICD-10-CM | POA: Diagnosis not present

## 2014-12-12 DIAGNOSIS — L03115 Cellulitis of right lower limb: Secondary | ICD-10-CM | POA: Diagnosis not present

## 2014-12-12 DIAGNOSIS — S41112A Laceration without foreign body of left upper arm, initial encounter: Secondary | ICD-10-CM | POA: Diagnosis not present

## 2014-12-13 NOTE — Progress Notes (Signed)
[name], [name] ([id number]) Visit Report for 12/12/2014 Chief Complaint Document Details Patient Name: [name], [name] Date of Service: 12/12/2014 2:30 PM Medical Record Number: [id number] Patient Account Number: [id number] Date of Birth/Sex: [birth year] (81 y.o. Female) Treating RN: Cornell Barman Primary Care Physician: Ria Bush Other Clinician: Referring Physician: Ria Bush Treating Physician/Extender: Frann Rider in Treatment: 68 Information Obtained from: Patient Chief Complaint Patient presents for treatment of an open ulcer due to venous insufficiency. 81 year old patient who is known to me from the [facility] wound care center now comes in follow up here for a chronic venous ulceration of her right lower extremity which she's had for about 6 months. Electronic Signature(s) Signed: 12/12/2014 3:22:21 PM By: Christin Fudge MD, FACS Entered By: Christin Fudge on 12/12/2014 15:22:21 [name], [name] ([id number]) -------------------------------------------------------------------------------- Cellular or Tissue Based Product Details Patient Name: [name], [name] Date of Service: 12/12/2014 2:30 PM Medical Record Number: [id number] Patient Account Number: [id number] Date of Birth/Sex: Apr 25, 1927 (81 y.o. Female) Treating RN: Cornell Barman Primary Care Physician: Ria Bush Other Clinician: Referring Physician: Ria Bush Treating Physician/Extender: Frann Rider in Treatment: 17 Cellular or Tissue Based Wound #1 Right,Medial Lower Leg Product Type Applied to: Performed By: Physician Pat Patrick., MD Cellular or Tissue Based Apligraf Product Type: Time-Out Taken: Yes Location: trunk / arms / legs Wound Size (sq cm): 4 Product Size (sq cm): 44 Waste Size (sq cm): 40 Waste Reason: wound size Amount of Product Applied (sq cm): 4 Lot #: [id number] Order #[id number] Expiration Date: 12/19/2014 Fenestrated: Yes Instrument:  Blade Reconstituted: No Secured: Yes Secured With: Steri-Strips Dressing Applied: Yes Primary Dressing: mepitel Procedural Pain: 0 Post Procedural Pain: 0 Response to Treatment: Procedure was tolerated well Post Procedure Diagnosis Same as Pre-procedure Electronic Signature(s) Signed: 12/12/2014 3:22:15 PM By: Christin Fudge MD, FACS Entered By: Christin Fudge on 12/12/2014 15:22:15 [name], [name] Loletha Grayer ([id number]) -------------------------------------------------------------------------------- HPI Details Patient Name: [name], [name] Date of Service: 12/12/2014 2:30 PM Medical Record Number: [id number] Patient Account Number: [id number] Date of Birth/Sex: 04-17-1927 (81 y.o. Female) Treating RN: Cornell Barman Primary Care Physician: Ria Bush Other Clinician: Referring Physician: Ria Bush Treating Physician/Extender: Frann Rider in Treatment: 17 History of Present Illness Location: ulcer in the right lower extremity for several months Quality: Patient reports experiencing a dull pain to affected area(s). Severity: Patient states wound (s) are getting better. Duration: Patient has had the wound for > 3 months prior to seeking treatment at the wound center Timing: Pain in wound is Intermittent (comes and goes Context: The wound appeared gradually over time Modifying Factors: Consults to this date include: vascular surgery for the veins in December 2015 and she's been seeing the wound care center at [facility] for several months. Associated Signs and Symptoms: Patient reports having difficulty standing for long periods. HPI Description: This is an 81 yrs old female we have been following since the beginning of January for a wound on the right medial ankle. She had previously had venous ablation of the greater saphenous vein by Dr. Kellie Simmering. She came to [place] with erythema of the right leg and subsequently is opened in the medial aspect of the right ankle. She has not  been very compliant with compression. We ordered her at juxta lite stocking, however she apparently has not been able to wear it. The area on the medial aspect of her left ankle has much less induration and erythema. The edema is better controlled. No evidence of infection is noted. 07/18/14; no major  change in the condition of this wound. There is still surface slough and probably some drainage. 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 All other veins showed no evidence of DVT, superficial venous thrombosis or incompetence. however she does have manifestations of chronic venous hypertension and I believe we will change her dressing over to Prisma and a Profore light dressing. she sees her primary care doctor regularly and her last hemoglobin A1c has been around 7%.her other comorbidities are hypertension, CABG in 1998, peptic ulcer disease, history of echo with a ejection fraction of 55-60% and grade 1 diastolic dysfunction. past surgical history include coronary artery bypass graft in 1998, cholecystectomy, appendectomy, hand surgery, endovenous ablation of the saphenous vein with laser by Dr. Kellie Simmering in December 2015. 08/29/2014 - she complains of increased calf pain over the past week. removed her compression bandage and pain improved. no fever or chills. Mild clear drainage. 09/04/2014 -- she has not been compliant and has not done a dressing change as we had ordered last week. She always complains and does not keep her wrap on and this past week she has done the same. She has completed a course of doxycycline. 09/25/2014 -- she has tolerated her dressing all week and has not removed it. She says it's hurting a lot today and would not like any debridement. We will do so as per her wishes. she did get a appointment with the vascular surgeon and sees him on 6/28. [name], [name] ([id number]) 10/03/2014 - She was seen by Dr. Tinnie Gens 09/30/2014. Assessment: I reviewed the previous venous duplex exam study performed in September 2015 which reveals no options for treating this venous ulcer. There is no significant superficial venous reflux to treat with laser ablation.Most recent ABI was 0.91 in the right foot with palpable dorsalis pedis pulse at 3+ Plan: patient needs to return to the [place] wound center and be treated by Dr. Con Memos. No arterial or venous options available from vascular standpoint or treatment of right leg stasis ulcer.Continued dressing changes and compression is the treatment of choice 10/16/2014 -- today will be her first application of Apligraf. 10/23/2014 -- we are going to be able to apply her second day of Apligraf next week. She is complaining of some tenderness in the region of Achilles tendon and this is possibly due to her wrap. 11/06/2014 -- she did not have Apligraf last week because the wound was not looking good but she says she is feeling much better now. 11/13/2014 -- she has been doing well this week and she is here for her second Apligraf. 11/27/2014 -- she is here for her third application of Apligraf 12/12/2014 -- she is here for her fourth application of Apligraf. Also during these past 3 days she had an injury to her left forearm which she caught against the edge of her desk and has a lacerated wound which has gone down to the subcutaneous tissue. Electronic Signature(s) Signed: 12/12/2014 3:23:06 PM By: Christin Fudge MD, FACS Entered By: Christin Fudge on 12/12/2014 15:23:05 [name], [name] ([id number]) -------------------------------------------------------------------------------- Physical Exam Details Patient Name: [name], [name] Date of Service: 12/12/2014 2:30 PM Medical Record Number: [id number] Patient Account Number: [id number] Date of Birth/Sex: 07/10/1927 (81 y.o. Female) Treating RN: Cornell Barman Primary Care Physician: Ria Bush Other Clinician: Referring Physician: Ria Bush Treating Physician/Extender: Frann Rider in Treatment:  17 Constitutional . Pulse regular. Respirations normal and unlabored. Afebrile. . Eyes Nonicteric. Reactive to light. Ears, Nose, Mouth, and Throat Lips, teeth, and gums WNL.Marland Kitchen Moist mucosa without lesions . Neck supple and nontender. No palpable supraclavicular or cervical adenopathy. Normal sized without goiter. Respiratory WNL. No retractions.. Cardiovascular Pedal Pulses WNL. No clubbing, cyanosis or edema. Chest Breasts symmetical and no nipple discharge.. Breast tissue WNL, no masses, lumps, or tenderness.. Lymphatic No adneopathy. No adenopathy. No adenopathy. Musculoskeletal Adexa without tenderness or enlargement.. Digits and nails w/o clubbing, cyanosis, infection, petechiae, ischemia, or inflammatory conditions.. Integumentary (Hair, Skin) No suspicious lesions. No crepitus or fluctuance. No peri-wound warmth or erythema. No masses.Marland Kitchen Psychiatric Judgement and insight Intact.. No evidence of depression, anxiety, or agitation.. Notes The wound looks excellent and will have the next application of Apligraf. She has a V-shaped injury to the left forearm and part of it has skin coverage Electronic Signature(s) Signed: 12/12/2014 3:23:51 PM By: Christin Fudge MD, FACS Entered By: Christin Fudge on 12/12/2014 15:23:51 [name], [name] -------------------------------------------------------------------------------- Physician Orders Details Patient Name: [name], [name] Date of Service: 12/12/2014 2:30 PM Medical Record Number: [id number] Patient Account Number: [id number] Date of Birth/Sex: [birth year] (81 y.o. Female) Treating RN: Cornell Barman Primary Care Physician: Ria Bush Other Clinician: Referring Physician: Ria Bush Treating Physician/Extender: Frann Rider in  Treatment: 17 Verbal / Phone Orders: Yes Clinician: Cornell Barman Read Back and Verified: Yes Diagnosis Coding Wound Cleansing Wound #1 Right,Medial Lower Leg o Cleanse wound with mild soap and water Anesthetic Wound #1 Right,Medial Lower Leg o Topical Lidocaine 4% cream applied to wound bed prior to debridement Wound #2 Left Forearm o Topical Lidocaine 4% cream applied to wound bed prior to debridement Primary Wound Dressing Wound #1 Right,Medial Lower Leg o Mepitel One Wound #2 Left Forearm o Mepitel One Secondary Dressing Wound #1 Right,Medial Lower Leg o Drawtex Wound #2 Left Forearm o Boardered Foam Dressing Dressing Change Frequency Wound #1 Right,Medial Lower Leg o Change dressing every week Wound #2 Left Forearm o Change dressing every week Follow-up Appointments Wound #1 Right,Medial Lower Leg o Return Appointment in 1 week. [name], [name] Wound #2 Left Forearm o Return Appointment in 1 week. Edema Control Wound #1 Right,Medial Lower Leg o 2 Layer Lite Compression System - Right Lower Extremity Additional Orders / Instructions Wound #1 Right,Medial Lower Leg o Increase protein intake. Advanced Therapies Wound #1 Right,Medial Lower Leg o Apligraf application in clinic; including contact layer, fixation with steri strips, dry gauze and cover dressing. Electronic Signature(s) Signed: 12/12/2014 4:37:26 PM By: Christin Fudge MD, FACS Signed: 12/12/2014 5:06:19 PM By: Gretta Cool RN, BSN, Kim RN, BSN Entered By: Gretta Cool, RN, BSN, Kim on 12/12/2014 15:09:05 [name], [name] -------------------------------------------------------------------------------- Problem List Details Patient Name: [name], [name] Date of Service: 12/12/2014 2:30 PM Medical Record Number: [id number] Patient Account Number: [id number] Date of Birth/Sex: 1928-02-09 (81 y.o. Female) Treating RN: Cornell Barman Primary Care Physician: Ria Bush Other Clinician: Referring Physician: Ria Bush Treating Physician/Extender: Frann Rider in Treatment: 17 Active Problems ICD-10 Encounter Code Description Active Date Diagnosis E11.622 Type 2 diabetes mellitus with other skin ulcer 08/15/2014 Yes E11.610 Type 2 diabetes mellitus with diabetic neuropathic 08/15/2014 Yes arthropathy I87.331 Chronic venous hypertension (idiopathic) with ulcer and 08/15/2014 Yes inflammation of right lower extremity L03.115 Cellulitis of right lower limb 08/29/2014 Yes S41.112A Laceration without foreign body of left upper arm, initial 12/12/2014 Yes encounter Inactive Problems Resolved Problems Electronic Signature(s) Signed: 12/12/2014 3:21:50 PM  By: Christin Fudge MD, FACS Entered By: Christin Fudge on 12/12/2014 15:21:50 Althouse, [name] ([id number]) -------------------------------------------------------------------------------- Progress Note Details Patient Name: [name], [name] Date of Service: 12/12/2014 2:30 PM Medical Record Number: [id number] Patient Account Number: [id number] Date of Birth/Sex: [birth year] (81 y.o. Female) Treating RN: Cornell Barman Primary Care Physician: Ria Bush Other Clinician: Referring Physician: Ria Bush Treating Physician/Extender: Frann Rider in Treatment: 17 Subjective Chief Complaint Information obtained from Patient Patient presents for treatment of an open ulcer due to venous insufficiency. 81 year old patient who is known to me from the [facility] wound care center now comes in follow up here for a chronic venous ulceration of her right lower extremity which she's had for about 6 months. History of Present Illness (HPI) The following HPI elements were documented for the patient's wound: Location: ulcer in the right lower extremity for several months Quality: Patient reports experiencing a dull pain to affected area(s). Severity: Patient states wound (s) are getting  better. Duration: Patient has had the wound for > 3 months prior to seeking treatment at the wound center Timing: Pain in wound is Intermittent (comes and goes Context: The wound appeared gradually over time Modifying Factors: Consults to this date include: vascular surgery for the veins in December 2015 and she's been seeing the wound care center at [facility] for several months. Associated Signs and Symptoms: Patient reports having difficulty standing for long periods. This is an 81 yrs old female we have been following since the beginning of January for a wound on the right medial ankle. She had previously had venous ablation of the greater saphenous vein by Dr. Kellie Simmering. She came to [place] with erythema of the right leg and subsequently is opened in the medial aspect of the right ankle. She has not been very compliant with compression. We ordered her at juxta lite stocking, however she apparently has not been able to wear it. The area on the medial aspect of her left ankle has much less induration and erythema. The edema is better controlled. No evidence of infection is noted. 07/18/14; no major change in the condition of this wound. There is still surface slough and probably some drainage. 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 All other veins showed no evidence of DVT, superficial venous thrombosis or incompetence. however she does have manifestations of chronic venous hypertension and I believe we will change her dressing over to Prisma and a Profore light dressing. she sees her primary care doctor regularly and her last hemoglobin A1c has been around 7%.her other comorbidities are hypertension, CABG in 1998, peptic ulcer disease, history of echo with a ejection fraction of 55-60% and grade 1 diastolic dysfunction. past surgical history include coronary artery bypass  graft in 1998, cholecystectomy, appendectomy, hand surgery, endovenous ablation of the saphenous vein with laser by Dr. Kellie Simmering in December 2015. TAYANNA, BRUMIT ([id number]) 08/29/2014 - she complains of increased calf pain over the past week. removed her compression bandage and pain improved. no fever or chills. Mild clear drainage. 09/04/2014 -- she has not been compliant and has not done a dressing change as we had ordered last week. She always complains and does not keep her wrap on and this past week she has done the same. She has completed a course of doxycycline. 09/25/2014 -- she has tolerated her dressing all week and has not removed it.  She says it's hurting a lot today and would not like any debridement. We will do so as per her wishes. she did get a appointment with the vascular surgeon and sees him on 6/28. 10/03/2014 - She was seen by Dr. Tinnie Gens 09/30/2014. Assessment: I reviewed the previous venous duplex exam study performed in September 2015 which reveals no options for treating this venous ulcer. There is no significant superficial venous reflux to treat with laser ablation.Most recent ABI was 0.91 in the right foot with palpable dorsalis pedis pulse at 3+ Plan: patient needs to return to the [place] wound center and be treated by Dr. Con Memos. No arterial or venous options available from vascular standpoint or treatment of right leg stasis ulcer.Continued dressing changes and compression is the treatment of choice 10/16/2014 -- today will be her first application of Apligraf. 10/23/2014 -- we are going to be able to apply her second day of Apligraf next week. She is complaining of some tenderness in the region of Achilles tendon and this is possibly due to her wrap. 11/06/2014 -- she did not have Apligraf last week because the wound was not looking good but she says she is feeling much better now. 11/13/2014 -- she has been doing well this week and she is here for her  second Apligraf. 11/27/2014 -- she is here for her third application of Apligraf 12/12/2014 -- she is here for her fourth application of Apligraf. Also during these past 3 days she had an injury to her left forearm which she caught against the edge of her desk and has a lacerated wound which has gone down to the subcutaneous tissue. Objective Constitutional Pulse regular. Respirations normal and unlabored. Afebrile. Vitals Time Taken: 2:37 PM, Height: 58 in, Weight: 151 lbs, BMI: 31.6, Temperature: 98.0 F, Pulse: 73 bpm, Respiratory Rate: 18 breaths/min, Blood Pressure: 129/60 mmHg. Eyes Nonicteric. Reactive to light. Ears, Nose, Mouth, and Throat Lips, teeth, and gums WNL.Marland Kitchen Moist mucosa without lesions . [name], [name] ([id number]) Neck supple and nontender. No palpable supraclavicular or cervical adenopathy. Normal sized without goiter. Respiratory WNL. No retractions.. Cardiovascular Pedal Pulses WNL. No clubbing, cyanosis or edema. Chest Breasts symmetical and no nipple discharge.. Breast tissue WNL, no masses, lumps, or tenderness.. Lymphatic No adneopathy. No adenopathy. No adenopathy. Musculoskeletal Adexa without tenderness or enlargement.. Digits and nails w/o clubbing, cyanosis, infection, petechiae, ischemia, or inflammatory conditions.Marland Kitchen Psychiatric Judgement and insight Intact.. No evidence of depression, anxiety, or agitation.. General Notes: The wound looks excellent and will have the next application of Apligraf. She has a V-shaped injury to the left forearm and part of it has skin coverage Integumentary (Hair, Skin) No suspicious lesions. No crepitus or fluctuance. No peri-wound warmth or erythema. No masses.. Wound #1 status is Open. Original cause of wound was Gradually Appeared. The wound is located on the Right,Medial Lower Leg. The wound measures 4cm length x 1cm width x 0.2cm depth; 3.142cm^2 area and 0.628cm^3 volume. The wound is limited to skin  breakdown. There is a large amount of serosanguineous drainage noted. The wound margin is flat and intact. There is large (67-100%) red, pink granulation within the wound bed. There is no necrotic tissue within the wound bed. The periwound skin appearance exhibited: Excoriation, Maceration, Moist, Erythema. The periwound skin appearance did not exhibit: Callus, Crepitus, Fluctuance, Friable, Induration, Localized Edema, Rash, Scarring, Dry/Scaly, Atrophie Blanche, Cyanosis, Ecchymosis, Hemosiderin Staining, Mottled, Pallor, Rubor. The surrounding wound skin color is noted with erythema which is circumferential. Periwound temperature was  noted as No Abnormality. The periwound has tenderness on palpation. Wound #2 status is Open. Original cause of wound was Trauma. The wound is located on the Left Forearm. The wound measures 1.8cm length x 2cm width x 0.1cm depth; 2.827cm^2 area and 0.283cm^3 volume. The wound is limited to skin breakdown. There is a small amount of serous drainage noted. The wound margin is flat and intact. There is small (1-33%) red granulation within the wound bed. There is a large (67- 100%) amount of necrotic tissue within the wound bed including Eschar and Adherent Slough. The periwound skin appearance exhibited: Moist. The periwound skin appearance did not exhibit: Callus, Crepitus, Excoriation, Fluctuance, Friable, Induration, Localized Edema, Rash, Scarring, Dry/Scaly, Maceration, Atrophie Blanche, Cyanosis, Ecchymosis, Hemosiderin Staining, Mottled, Pallor, Rubor, Erythema. SHANADE, MATTIA ([id number]) Assessment Active Problems ICD-10 E11.622 - Type 2 diabetes mellitus with other skin ulcer E11.610 - Type 2 diabetes mellitus with diabetic neuropathic arthropathy I87.331 - Chronic venous hypertension (idiopathic) with ulcer and inflammation of right lower extremity L03.115 - Cellulitis of right lower limb S41.112A - Laceration without foreign body of left upper  arm, initial encounter With the usual precautions the Apligraf was applied Steri-Stripped in place and bolstered and then a 2 layer compression wrap was applied. As far as her left upper arm goes we will use Mepitel and a bordered foam dressing and leave this intact for a week. Have asked him not to get her dressings wet. Procedures Wound #1 Wound #1 is a Venous Leg Ulcer located on the Right,Medial Lower Leg. A skin graft procedure using a bioengineered skin substitute/cellular or tissue based product was performed by Briunna Leicht, Jackson Latino., MD. Apligraf was applied and secured with Steri-Strips. 4 sq cm of product was utilized and 40 sq cm was wasted due to wound size. Post Application, mepitel was applied. A Time Out was conducted prior to the start of the procedure. The procedure was tolerated well with a pain level of 0 throughout and a pain level of 0 following the procedure. Post procedure Diagnosis Wound #1: Same as Pre-Procedure . Plan Wound Cleansing: Wound #1 Right,Medial Lower Leg: Cleanse wound with mild soap and water Anesthetic: Wound #1 Right,Medial Lower Leg: Topical Lidocaine 4% cream applied to wound bed prior to debridement Wound #2 Left Forearm: Topical Lidocaine 4% cream applied to wound bed prior to debridement Borton, Ariyanah C. ([id number]) Primary Wound Dressing: Wound #1 Right,Medial Lower Leg: Mepitel One Wound #2 Left Forearm: Mepitel One Secondary Dressing: Wound #1 Right,Medial Lower Leg: Drawtex Wound #2 Left Forearm: Boardered Foam Dressing Dressing Change Frequency: Wound #1 Right,Medial Lower Leg: Change dressing every week Wound #2 Left Forearm: Change dressing every week Follow-up Appointments: Wound #1 Right,Medial Lower Leg: Return Appointment in 1 week. Wound #2 Left Forearm: Return Appointment in 1 week. Edema Control: Wound #1 Right,Medial Lower Leg: 2 Layer Lite Compression System - Right Lower Extremity Additional Orders /  Instructions: Wound #1 Right,Medial Lower Leg: Increase protein intake. Advanced Therapies: Wound #1 Right,Medial Lower Leg: Apligraf application in clinic; including contact layer, fixation with steri strips, dry gauze and cover dressing. With the usual precautions the Apligraf was applied Steri-Stripped in place and bolstered and then a 2 layer compression wrap was applied. As far as her left upper arm goes we will use Mepitel and a bordered foam dressing and leave this intact for a week. Have asked him not to get her dressings wet. Electronic Signature(s) Signed: 12/12/2014 3:24:47 PM By: Christin Fudge MD, FACS Entered By: Con Memos  Kaytlynne Neace on 12/12/2014 15:24:47 Massiah, Sireen C. ([id number]) -------------------------------------------------------------------------------- SuperBill Details Patient Name: Breidenbach, Nefertiti C. Date of Service: 12/12/2014 Medical Record Number: [id number] Patient Account Number: [id number] Date of Birth/Sex: 08-18-1927 (81 y.o. Female) Treating RN: Cornell Barman Primary Care Physician: Ria Bush Other Clinician: Referring Physician: Ria Bush Treating Physician/Extender: Frann Rider in Treatment: 17 Diagnosis Coding ICD-10 Codes Code Description E11.622 Type 2 diabetes mellitus with other skin ulcer E11.610 Type 2 diabetes mellitus with diabetic neuropathic arthropathy Chronic venous hypertension (idiopathic) with ulcer and inflammation of right lower I87.331 extremity L03.115 Cellulitis of right lower limb S41.112A Laceration without foreign body of left upper arm, initial encounter Facility Procedures CPT4: Description Modifier Quantity Code [id number] (Facility Use Only) Apligraf 1 SQ CM 71 CPT4: [id number] - SKIN SUB GRAFT TRNK/ARM/LEG 1 ICD-10 Description Diagnosis E11.622 Type 2 diabetes mellitus with other skin ulcer E11.610 Type 2 diabetes mellitus with diabetic neuropathic arthropathy I87.331 Chronic venous hypertension   (idiopathic) with ulcer and inflammation of right lower extremity Physician Procedures CPT4: Description Modifier Quantity Code [id number] - WC PHYS LEVEL 3 - EST PT 25 1 ICD-10 Description Diagnosis S41.112A Laceration without foreign body of left upper arm, initial encounter CPT4: [id number] - WC PHYS SKIN SUB GRAFT TRNK/ARM/LEG 1 ICD-10 Description Diagnosis E11.622 Type 2 diabetes mellitus with other skin ulcer E11.610 Type 2 diabetes mellitus with diabetic neuropathic arthropathy Waller, Giada CMarland Kitchen ([id number]) Electronic Signature(s) Signed: 12/12/2014 3:25:22 PM By: Christin Fudge MD, FACS Entered By: Christin Fudge on 12/12/2014 15:25:22

## 2014-12-13 NOTE — Progress Notes (Signed)
MAYDEAN, REICHLE ([id number]) Visit Report for 12/12/2014 Arrival Information Details Patient Name: [name], [name] Date of Service: 12/12/2014 2:30 PM Medical Record Number: [id number] Patient Account Number: [id number] Date of Birth/Sex: March 03, 1928 (81 y.o. Female) Treating RN: [name] Primary Care Physician: [name] Other Clinician: Referring Physician: Ria [name] Treating Physician/Extender: [name] in Treatment: 38 Visit Information History Since Last Visit Added or deleted any medications: No Patient Arrived: Walker Any new allergies or adverse reactions: No Arrival Time: 14:36 Had a fall or experienced change in No Accompanied By: son in lobby activities of daily living that may affect Transfer Assistance: None risk of falls: Patient Identification Verified: Yes Signs or symptoms of abuse/neglect since last No Secondary Verification Process Yes visito Completed: Hospitalized since last visit: No Patient Has Alerts: Yes Has Dressing in Place as Prescribed: Yes Patient Alerts: Patient on Blood Has Compression in Place as Prescribed: Yes Thinner Pain Present Now: No Type II Diabetic 81 MG aspirin Electronic Signature(s) Signed: 12/12/2014 5:06:19 PM By: [name] Cool, RN, [name], [name] Entered By: [name] Cool, RN, [name], [name] on 12/12/2014 14:36:55 [name], [name] ([id number]) -------------------------------------------------------------------------------- Encounter Discharge Information Details Patient Name: [name], [name] Date of Service: 12/12/2014 2:30 PM Medical Record Number: [id number] Patient Account Number: [id number] Date of Birth/Sex: 1927/11/30 (81 y.o. Female) Treating RN: [name] Primary Care Physician: [name] Other Clinician: Referring Physician: Ria [name] Treating Physician/Extender: [name] in Treatment: 17 Encounter Discharge Information Items Discharge Pain Level: 0 Discharge Condition:  Stable Ambulatory Status: Walker Discharge Destination: Home Transportation: Private Auto Accompanied By: son in lobby Schedule Follow-up Appointment: Yes Medication Reconciliation completed Yes and provided to Patient/Care Jerrin Recore: Provided on Clinical Summary of Care: 12/12/2014 Form Type Recipient Paper Patient LL Electronic Signature(s) Signed: 12/12/2014 5:06:19 PM By: [name] Cool RN, [name], [name] Previous Signature: 12/12/2014 3:22:17 PM Version By: Ruthine Dose Entered By: [name] Cool RN, [name], [name] on 12/12/2014 15:23:02 Fluegge, [name] ([id number]) -------------------------------------------------------------------------------- Lower Extremity Assessment Details Patient Name: [name], [name] Date of Service: 12/12/2014 2:30 PM Medical Record Number: [id number] Patient Account Number: [id number] Date of Birth/Sex: [birth year] (81 y.o. Female) Treating RN: [name] Primary Care Physician: [name] Other Clinician: Referring Physician: Ria [name] Treating Physician/Extender: [name] in Treatment: 17 Edema Assessment Assessed: [Left: No] [Right: No] E[Left: dema] [Right: :] Calf Left: Right: Point of Measurement: 27 cm From Medial Instep cm 26.5 cm Ankle Left: Right: Point of Measurement: 10 cm From Medial Instep cm 20.5 cm Vascular Assessment Pulses: Posterior Tibial Dorsalis Pedis Palpable: [Right:Yes] Extremity colors, hair growth, and conditions: Extremity Color: [Right:Normal] Hair Growth on Extremity: [Right:No] Temperature of Extremity: [Right:Warm] Capillary Refill: [Right:< 3 seconds] Toe Nail Assessment Left: Right: Thick: Yes Discolored: Yes Deformed: Yes Improper Length and Hygiene: No Electronic Signature(s) Signed: 12/12/2014 5:06:19 PM By: [name] Cool, RN, [name], [name] Entered By: [name] Cool, RN, [name], [name] on 12/12/2014 14:39:08 [name], [name]  ([id number]) -------------------------------------------------------------------------------- Multi Wound Chart Details Patient Name: [name], [name] Date of Service: 12/12/2014 2:30 PM Medical Record Number: [id number] Patient Account Number: [id number] Date of Birth/Sex: 12-10-1927 (81 y.o. Female) Treating RN: [name] Primary Care Physician: [name] Other Clinician: Referring Physician: Ria [name] Treating Physician/Extender: [name] in Treatment: 17 Vital Signs Height(in): 58 Pulse(bpm): 73 Weight(lbs): 151 Blood Pressure 129/60 (mmHg): Body Mass Index(BMI): 32 Temperature(F): 98.0 Respiratory Rate 18 (breaths/min): Photos: [1:No Photos] [2:No Photos] [N/A:N/A] Wound Location: [1:Right Lower Leg - Medial] [2:Left Forearm] [N/A:N/A] Wounding Event: [1:Gradually Appeared] [2:Trauma] [  N/A:N/A] Primary Etiology: [1:Venous Leg Ulcer] [2:Skin Tear] [N/A:N/A] Comorbid History: [1:Cataracts, Anemia, Coronary Artery Disease, Hypertension, Myocardial Infarction, Type II Diabetes, Osteoarthritis] [2:Cataracts, Anemia, Coronary Artery Disease, Hypertension, Myocardial Infarction, Type II Diabetes, Osteoarthritis]  [N/A:N/A] Date Acquired: [1:04/04/2014] [2:12/10/2014] [N/A:N/A] Weeks of Treatment: [1:17] [2:0] [N/A:N/A] Wound Status: [1:Open] [2:Open] [N/A:N/A] Measurements L x W x D 4x1x0.2 [2:1.8x2x0.1] [N/A:N/A] (cm) Area (cm) : [1:3.142] [2:2.827] [N/A:N/A] Volume (cm) : [1:0.628] [2:0.283] [N/A:N/A] % Reduction in Area: [1:46.70%] [2:0.00%] [N/A:N/A] % Reduction in Volume: 64.50% [2:0.00%] [N/A:N/A] Classification: [1:Full Thickness Without Exposed Support Structures] [2:Partial Thickness] [N/A:N/A] HBO Classification: [1:Grade 1] [2:N/A] [N/A:N/A] Exudate Amount: [1:Large] [2:Small] [N/A:N/A] Exudate Type: [1:Serosanguineous] [2:Serous] [N/A:N/A] Exudate Color: [1:red, brown] [2:amber] [N/A:N/A] Wound Margin: [1:Flat and Intact] [2:Flat and  Intact] [N/A:N/A] Granulation Amount: [1:Large (67-100%)] [2:Small (1-33%)] [N/A:N/A] Granulation Quality: [1:Red, Pink, Hyper- granulation] [2:Red] [N/A:N/A] Necrotic Amount: [1:None Present (0%)] [2:Large (67-100%)] [N/A:N/A] Necrotic Tissue: N/A Eschar, Adherent Slough N/A Exposed Structures: Fascia: No Fascia: No N/A Fat: No Fat: No Tendon: No Tendon: No Muscle: No Muscle: No Joint: No Joint: No Bone: No Bone: No Limited to Skin Limited to Skin Breakdown Breakdown Epithelialization: Small (1-33%) N/A N/A Periwound Skin Texture: Excoriation: Yes Edema: No N/A Edema: No Excoriation: No Induration: No Induration: No Callus: No Callus: No Crepitus: No Crepitus: No Fluctuance: No Fluctuance: No Friable: No Friable: No Rash: No Rash: No Scarring: No Scarring: No Periwound Skin Maceration: Yes Moist: Yes N/A Moisture: Moist: Yes Maceration: No Dry/Scaly: No Dry/Scaly: No Periwound Skin Color: Erythema: Yes Atrophie Blanche: No N/A Atrophie Blanche: No Cyanosis: No Cyanosis: No Ecchymosis: No Ecchymosis: No Erythema: No Hemosiderin Staining: No Hemosiderin Staining: No Mottled: No Mottled: No Pallor: No Pallor: No Rubor: No Rubor: No Erythema Location: Circumferential N/A N/A Temperature: No Abnormality N/A N/A Tenderness on Yes No N/A Palpation: Wound Preparation: Ulcer Cleansing: Other: Ulcer Cleansing: N/A soap and water Rinsed/Irrigated with Saline Topical Anesthetic Applied: None, Other: Topical Anesthetic lidocaine4% Applied: Other: lidocaine 4% Treatment Notes Electronic Signature(s) Signed: 12/12/2014 5:06:19 PM By: [name] Cool, RN, [name], [name] Entered By: [name] Cool, RN, [name], [name] on 12/12/2014 15:04:46 [name], [name] -------------------------------------------------------------------------------- [place] Details Patient Name: [name], [name] Date of Service: 12/12/2014 2:30 PM Medical Record Number:  [id number] Patient Account Number: [id number] Date of Birth/Sex: 1927-08-24 (81 y.o. Female) Treating RN: [name] Primary Care Physician: [name] Other Clinician: Referring Physician: Ria [name] Treating Physician/Extender: [name] in Treatment: 62 Active Inactive Abuse / Safety / Falls / Self Care Management Nursing Diagnoses: Potential for falls Goals: Patient will remain injury free Date Initiated: 08/15/2014 Goal Status: Active Interventions: Assess fall risk on admission and as needed Notes: Nutrition Nursing Diagnoses: Impaired glucose control: actual or potential Goals: Patient/caregiver verbalizes understanding of need to maintain therapeutic glucose control per primary care physician Date Initiated: 08/15/2014 Goal Status: Active Interventions: Assess patient nutrition upon admission and as needed per policy Notes: Orientation to the Wound Care Program Nursing Diagnoses: Knowledge deficit related to the wound healing center program Goals: Patient/caregiver will verbalize understanding of the [place] YET[place], OKE ([id number]) Date Initiated: 08/15/2014 Goal Status: Active Interventions: Provide education on orientation to the wound center Notes: Venous Leg Ulcer Nursing Diagnoses: Actual venous Insuffiency (use after diagnosis is confirmed) Goals: Patient will maintain optimal edema control Date Initiated: 08/15/2014 Goal Status: Active Interventions: Compression as ordered Treatment Activities: Test ordered outside of clinic : 12/12/2014 Notes: Wound/Skin Impairment Nursing Diagnoses: Impaired tissue integrity Goals: Patient/caregiver will  verbalize understanding of skin care regimen Date Initiated: 08/15/2014 Goal Status: Active Interventions: Assess patient/caregiver ability to obtain necessary supplies Notes: Electronic Signature(s) Signed: 12/12/2014 5:06:19 PM By: [name] Cool, RN, [name], Kim RN,  [name] Entered By: [name] Cool, RN, [name], [name] on 12/12/2014 15:04:39 [name], [name] ([id number]) -------------------------------------------------------------------------------- Patient/Caregiver Education Details Patient Name: [name], [name] Date of Service: 12/12/2014 2:30 PM Medical Record Number: [id number] Patient Account Number: [id number] Date of Birth/Gender: 1927-08-08 (81 y.o. Female) Treating RN: [name] Primary Care Physician: [name] Other Clinician: Referring Physician: Ria [name] Treating Physician/Extender: [name] in Treatment: 17 Education Assessment Education Provided To: Patient Education Topics Provided Wound/Skin Impairment: Handouts: Caring for Your Ulcer, Other: continue wound care as prescribed Electronic Signature(s) Signed: 12/12/2014 5:06:19 PM By: [name] Cool, RN, [name], [name] Entered By: [name] Cool, RN, [name], [name] on 12/12/2014 15:23:18 [name], [name] ([id number]) -------------------------------------------------------------------------------- Wound Assessment Details Patient Name: [name], [name] Date of Service: 12/12/2014 2:30 PM Medical Record Number: [id number] Patient Account Number: [id number] Date of Birth/Sex: 1928-03-20 (81 y.o. Female) Treating RN: [name] Primary Care Physician: [name] Other Clinician: Referring Physician: Ria [name] Treating Physician/Extender: [name] in Treatment: 17 Wound Status Wound Number: 1 Primary Venous Leg Ulcer Etiology: Wound Location: Right Lower Leg - Medial Wound Open Wounding Event: Gradually Appeared Status: Date Acquired: 04/04/2014 Comorbid Cataracts, Anemia, Coronary Artery Weeks Of Treatment: 17 History: Disease, Hypertension, Myocardial Clustered Wound: No Infarction, Type II Diabetes, Osteoarthritis Photos Photo Uploaded By: [name] Cool, RN, [name], [name] on 12/12/2014 17:04:42 Wound Measurements Length: (cm) 4 Width: (cm) 1 Depth: (cm)  0.2 Area: (cm) 3.142 Volume: (cm) 0.628 % Reduction in Area: 46.7% % Reduction in Volume: 64.5% Epithelialization: Small (1-33%) Wound Description Full Thickness Without Exposed Foul Odor A Classification: Support Structures Diabetic Severity Grade 1 (Wagner): Wound Margin: Flat and Intact Exudate Amount: Large Exudate Type: Serosanguineous Exudate Color: red, brown fter Cleansing: No Wound Bed Krass, Tamiki C. ([id number]) Granulation Amount: Large (67-100%) Exposed Structure Granulation Quality: Red, Pink, Hyper-granulation Fascia Exposed: No Necrotic Amount: None Present (0%) Fat Layer Exposed: No Tendon Exposed: No Muscle Exposed: No Joint Exposed: No Bone Exposed: No Limited to Skin Breakdown Periwound Skin Texture Texture Color No Abnormalities Noted: No No Abnormalities Noted: No Callus: No Atrophie Blanche: No Crepitus: No Cyanosis: No Excoriation: Yes Ecchymosis: No Fluctuance: No Erythema: Yes Friable: No Erythema Location: Circumferential Induration: No Hemosiderin Staining: No Localized Edema: No Mottled: No Rash: No Pallor: No Scarring: No Rubor: No Moisture Temperature / Pain No Abnormalities Noted: No Temperature: No Abnormality Dry / Scaly: No Tenderness on Palpation: Yes Maceration: Yes Moist: Yes Wound Preparation Ulcer Cleansing: Other: soap and water, Topical Anesthetic Applied: None, Other: lidocaine4%, Treatment Notes Wound #1 (Right, Medial Lower Leg) 1. Cleansed with: Clean wound with Normal Saline 2. Anesthetic Topical Lidocaine 4% cream to wound bed prior to debridement 4. Dressing Applied: Mepitel 7. Secured with 2 Layer Lite Compression System - Right Lower Extremity Notes Apligraf applied today Electronic Signature(s) Signed: 12/12/2014 5:06:19 PM By: [name] Cool, RN, [name], [name] Zanni, [place] ([id number]) Entered By: [name] Cool, RN, [name], [name] on 12/12/2014 14:49:38 [place]ford, [name]  ([id number]) -------------------------------------------------------------------------------- Wound Assessment Details Patient Name: [name], [name] Date of Service: 12/12/2014 2:30 PM Medical Record Number: [id number] Patient Account Number: [id number] Date of Birth/Sex: [birth year] (81 y.o. Female) Treating RN: [name] Primary Care Physician: [name] Other Clinician: Referring Physician: Ria [name] Treating Physician/Extender: [name] in Treatment: 17 Wound Status Wound Number: 2 Primary  Skin Tear Etiology: Wound Location: Left Forearm Wound Open Wounding Event: Trauma Status: Date Acquired: 12/10/2014 Comorbid Cataracts, Anemia, Coronary Artery Weeks Of Treatment: 0 History: Disease, Hypertension, Myocardial Clustered Wound: No Infarction, Type II Diabetes, Osteoarthritis Photos Photo Uploaded By: [name] Cool, RN, [name], [name] on 12/12/2014 17:04:43 Wound Measurements Length: (cm) 1.8 Width: (cm) 2 Depth: (cm) 0.1 Area: (cm) 2.827 Volume: (cm) 0.283 % Reduction in Area: 0% % Reduction in Volume: 0% Wound Description Classification: Partial Thickness Wound Margin: Flat and Intact Exudate Amount: Small Exudate Type: Serous Exudate Color: amber Wound Bed Granulation Amount: Small (1-33%) Exposed Structure Granulation Quality: Red Fascia Exposed: No Necrotic Amount: Large (67-100%) Fat Layer Exposed: No Barrientez, Tiarna C. ([id number]) Necrotic Quality: Eschar, Adherent Slough Tendon Exposed: No Muscle Exposed: No Joint Exposed: No Bone Exposed: No Limited to Skin Breakdown Periwound Skin Texture Texture Color No Abnormalities Noted: No No Abnormalities Noted: No Callus: No Atrophie Blanche: No Crepitus: No Cyanosis: No Excoriation: No Ecchymosis: No Fluctuance: No Erythema: No Friable: No Hemosiderin Staining: No Induration: No Mottled: No Localized Edema: No Pallor: No Rash: No Rubor: No Scarring: No Moisture No  Abnormalities Noted: No Dry / Scaly: No Maceration: No Moist: Yes Wound Preparation Ulcer Cleansing: Rinsed/Irrigated with Saline Topical Anesthetic Applied: Other: lidocaine 4%, Treatment Notes Wound #2 (Left Forearm) 1. Cleansed with: Clean wound with Normal Saline 2. Anesthetic Topical Lidocaine 4% cream to wound bed prior to debridement 4. Dressing Applied: Mepitel 7. Secured with 2 Layer Lite Compression System - Right Lower Extremity Notes Apligraf applied today Electronic Signature(s) Signed: 12/12/2014 5:06:19 PM By: [name] Cool, RN, [name], [name] Entered By: [name] Cool, RN, [name], [name] on 12/12/2014 14:51:17 Londo, [name] -------------------------------------------------------------------------------- [place] Details Patient Name: [name], [name] Date of Service: 12/12/2014 2:30 PM Medical Record Number: [id number] Patient Account Number: [id number] Date of Birth/Sex: [birth year] (81 y.o. Female) Treating RN: [name] Primary Care Physician: [name] Other Clinician: Referring Physician: Ria [name] Treating Physician/Extender: [name] in Treatment: 17 Vital Signs Time Taken: 14:37 Temperature (F): 98.0 Height (in): 58 Pulse (bpm): 73 Weight (lbs): 151 Respiratory Rate (breaths/min): 18 Body Mass Index (BMI): 31.6 Blood Pressure (mmHg): 129/60 Reference Range: 80 - 120 mg / dl Electronic Signature(s) Signed: 12/12/2014 5:06:19 PM By: [name] Cool, RN, [name], [name] Entered By: [name] Cool, RN, [name], [name] on 12/12/2014 14:37:36

## 2014-12-19 ENCOUNTER — Encounter: Payer: Medicare Other | Admitting: Surgery

## 2014-12-19 DIAGNOSIS — I87331 Chronic venous hypertension (idiopathic) with ulcer and inflammation of right lower extremity: Secondary | ICD-10-CM | POA: Diagnosis not present

## 2014-12-19 DIAGNOSIS — I10 Essential (primary) hypertension: Secondary | ICD-10-CM | POA: Diagnosis not present

## 2014-12-19 DIAGNOSIS — L03115 Cellulitis of right lower limb: Secondary | ICD-10-CM | POA: Diagnosis not present

## 2014-12-19 DIAGNOSIS — E11610 Type 2 diabetes mellitus with diabetic neuropathic arthropathy: Secondary | ICD-10-CM | POA: Diagnosis not present

## 2014-12-19 DIAGNOSIS — L97211 Non-pressure chronic ulcer of right calf limited to breakdown of skin: Secondary | ICD-10-CM | POA: Diagnosis not present

## 2014-12-19 DIAGNOSIS — S41112A Laceration without foreign body of left upper arm, initial encounter: Secondary | ICD-10-CM | POA: Diagnosis not present

## 2014-12-19 DIAGNOSIS — E11622 Type 2 diabetes mellitus with other skin ulcer: Secondary | ICD-10-CM | POA: Diagnosis not present

## 2014-12-20 NOTE — Progress Notes (Signed)
VIVA, CHAMBLISS ([id number]) Visit Report for 12/19/2014 Arrival Information Details Patient Name: [name], [name] Date of Service: 12/19/2014 4:00 PM Medical Record Number: [id number] Patient Account Number: [id number] Date of Birth/Sex: 06-14-1927 (81 y.o. Female) Treating RN: Cornell Barman Primary Care Physician: Ria Bush Other Clinician: Referring Physician: Ria Bush Treating Physician/Extender: Frann Rider in Treatment: 18 Visit Information History Since Last Visit Added or deleted any medications: No Patient Arrived: Walker Any new allergies or adverse reactions: No Arrival Time: 16:22 Had a fall or experienced change in No Accompanied By: self activities of daily living that may affect Transfer Assistance: None risk of falls: Patient Identification Verified: Yes Signs or symptoms of abuse/neglect since last No Secondary Verification Process Yes visito Completed: Has Dressing in Place as Prescribed: Yes Patient Has Alerts: Yes Pain Present Now: No Patient Alerts: Patient on Blood Thinner Type II Diabetic 81 MG aspirin Electronic Signature(s) Signed: 12/19/2014 5:32:35 PM By: Gretta Cool, RN, BSN, Kim RN, BSN Entered By: Gretta Cool, RN, BSN, Kim on 12/19/2014 16:22:23 Drollinger, [name] ([id number]) -------------------------------------------------------------------------------- Encounter Discharge Information Details Patient Name: Chicoine, Dechelle C. Date of Service: 12/19/2014 4:00 PM Medical Record Number: [id number] Patient Account Number: [id number] Date of Birth/Sex: 05-15-1927 (81 y.o. Female) Treating RN: Cornell Barman Primary Care Physician: Ria Bush Other Clinician: Referring Physician: Ria Bush Treating Physician/Extender: Frann Rider in Treatment: 18 Encounter Discharge Information Items Discharge Pain Level: 0 Discharge Condition: Stable Ambulatory Status: Walker Discharge Destination: Home Transportation: Private  Auto Accompanied By: son in lobby Schedule Follow-up Appointment: Yes Medication Reconciliation completed Yes and provided to Patient/Care Provider: Provided on Clinical Summary of Care: 12/19/2014 Form Type Recipient Paper Patient LL Electronic Signature(s) Signed: 12/19/2014 5:32:35 PM By: Gretta Cool, RN, BSN, Kim RN, BSN Previous Signature: 12/19/2014 4:35:27 PM Version By: Lorine Bears RCP, RRT, CHT Entered By: Gretta Cool, RN, BSN, Kim on 12/19/2014 16:42:57 Blackie, [name] ([id number]) -------------------------------------------------------------------------------- Lower Extremity Assessment Details Patient Name: Platt, Turner C. Date of Service: 12/19/2014 4:00 PM Medical Record Number: [id number] Patient Account Number: [id number] Date of Birth/Sex: January 25, 1928 (81 y.o. Female) Treating RN: Cornell Barman Primary Care Physician: Ria Bush Other Clinician: Referring Physician: Ria Bush Treating Physician/Extender: Frann Rider in Treatment: 18 Edema Assessment Assessed: [Left: No] [Right: No] E[Left: dema] [Right: :] Calf Left: Right: Point of Measurement: 27 cm From Medial Instep cm 26 cm Ankle Left: Right: Point of Measurement: 10 cm From Medial Instep cm 20.5 cm Vascular Assessment Pulses: Posterior Tibial Dorsalis Pedis Palpable: [Right:Yes] Extremity colors, hair growth, and conditions: Extremity Color: [Right:Mottled] Hair Growth on Extremity: [Right:No] Temperature of Extremity: [Right:Warm] Capillary Refill: [Right:< 3 seconds] Toe Nail Assessment Left: Right: Thick: Yes Discolored: Yes Deformed: No Improper Length and Hygiene: No Electronic Signature(s) Signed: 12/19/2014 5:32:35 PM By: Gretta Cool, RN, BSN, Kim RN, BSN Entered By: Gretta Cool, RN, BSN, Kim on 12/19/2014 16:30:50 Rodgers, [name] ([id number]) -------------------------------------------------------------------------------- Multi Wound Chart Details Patient Name:  Matar, [name] Date of Service: 12/19/2014 4:00 PM Medical Record Number: [id number] Patient Account Number: [id number] Date of Birth/Sex: [birth year] (81 y.o. Female) Treating RN: Cornell Barman Primary Care Physician: Ria Bush Other Clinician: Referring Physician: Ria Bush Treating Physician/Extender: Frann Rider in Treatment: 18 Vital Signs Height(in): 58 Pulse(bpm): 68 Weight(lbs): 151 Blood Pressure 137/61 (mmHg): Body Mass Index(BMI): 32 Temperature(F): 97.6 Respiratory Rate 18 (breaths/min): Photos: [1:No Photos] [2:No Photos] [N/A:N/A] Wound Location: [1:Right, Medial Lower Leg] [2:Left Forearm] [N/A:N/A] Wounding Event: [1:Gradually Appeared] [2:Trauma] [N/A:N/A] Primary Etiology: [1:Venous Leg Ulcer] [2:Skin Tear] [N/A:N/A] Date Acquired: [  1:04/04/2014] [2:12/10/2014] [N/A:N/A] Weeks of Treatment: [1:18] [2:1] [N/A:N/A] Wound Status: [1:Open] [2:Open] [N/A:N/A] Measurements L x W x D 4x1x0.2 [2:1.5x1x0.2] [N/A:N/A] (cm) Area (cm) : [1:3.142] [2:1.178] [N/A:N/A] Volume (cm) : [1:0.628] [2:0.236] [N/A:N/A] % Reduction in Area: [1:46.70%] [2:58.30%] [N/A:N/A] % Reduction in Volume: 64.50% [2:16.60%] [N/A:N/A] Classification: [1:Full Thickness Without Exposed Support Structures] [2:Partial Thickness] [N/A:N/A] Periwound Skin Texture: No Abnormalities Noted [2:No Abnormalities Noted] [N/A:N/A] Periwound Skin [1:No Abnormalities Noted] [2:No Abnormalities Noted] [N/A:N/A] Moisture: Periwound Skin Color: No Abnormalities Noted [2:No Abnormalities Noted] [N/A:N/A] Tenderness on [1:No] [2:No] [N/A:N/A] Treatment Notes Electronic Signature(s) Signed: 12/19/2014 5:32:35 PM By: Gretta Cool, RN, BSN, Kim RN, BSN Entered By: Gretta Cool, RN, BSN, Kim on 12/19/2014 16:32:41 Dorvil, [name] ([id number]) Asebedo, [name] ([id number]) -------------------------------------------------------------------------------- [place] Details Patient Name:  Lasalle, Amauri C. Date of Service: 12/19/2014 4:00 PM Medical Record Number: [id number] Patient Account Number: [id number] Date of Birth/Sex: 09/03/1927 (81 y.o. Female) Treating RN: Cornell Barman Primary Care Physician: Ria Bush Other Clinician: Referring Physician: Ria Bush Treating Physician/Extender: Frann Rider in Treatment: 18 Active Inactive Abuse / Safety / Falls / Self Care Management Nursing Diagnoses: Potential for falls Goals: Patient will remain injury free Date Initiated: 08/15/2014 Goal Status: Active Interventions: Assess fall risk on admission and as needed Notes: Nutrition Nursing Diagnoses: Impaired glucose control: actual or potential Goals: Patient/caregiver verbalizes understanding of need to maintain therapeutic glucose control per primary care physician Date Initiated: 08/15/2014 Goal Status: Active Interventions: Assess patient nutrition upon admission and as needed per policy Notes: Orientation to the Wound Care Program Nursing Diagnoses: Knowledge deficit related to the wound healing center program Goals: Patient/caregiver will verbalize understanding of the [place], Erine C. ([id number]) Date Initiated: 08/15/2014 Goal Status: Active Interventions: Provide education on orientation to the wound center Notes: Venous Leg Ulcer Nursing Diagnoses: Actual venous Insuffiency (use after diagnosis is confirmed) Goals: Patient will maintain optimal edema control Date Initiated: 08/15/2014 Goal Status: Active Interventions: Compression as ordered Treatment Activities: Test ordered outside of clinic : 12/19/2014 Notes: Wound/Skin Impairment Nursing Diagnoses: Impaired tissue integrity Goals: Patient/caregiver will verbalize understanding of skin care regimen Date Initiated: 08/15/2014 Goal Status: Active Interventions: Assess patient/caregiver ability to obtain necessary  supplies Notes: Electronic Signature(s) Signed: 12/19/2014 5:32:35 PM By: Gretta Cool, RN, BSN, Kim RN, BSN Entered By: Gretta Cool, RN, BSN, Kim on 12/19/2014 16:32:34 Vanhandel, [name] ([id number]) -------------------------------------------------------------------------------- Patient/Caregiver Education Details Patient Name: Falco, Lenyx C. Date of Service: 12/19/2014 4:00 PM Medical Record Number: [id number] Patient Account Number: [id number] Date of Birth/Gender: Aug 16, 1927 (81 y.o. Female) Treating RN: Cornell Barman Primary Care Physician: Ria Bush Other Clinician: Referring Physician: Ria Bush Treating Physician/Extender: Frann Rider in Treatment: 18 Education Assessment Education Provided To: Patient Education Topics Provided Wound/Skin Impairment: Handouts: Other: continue wound care as prescibed Methods: Demonstration Responses: State content correctly Electronic Signature(s) Signed: 12/19/2014 5:32:35 PM By: Gretta Cool, RN, BSN, Kim RN, BSN Entered By: Gretta Cool, RN, BSN, Kim on 12/19/2014 16:43:17 Capraro, [name] ([id number]) -------------------------------------------------------------------------------- Wound Assessment Details Patient Name: Weaver, Daelyn C. Date of Service: 12/19/2014 4:00 PM Medical Record Number: [id number] Patient Account Number: [id number] Date of Birth/Sex: September 17, 1927 (81 y.o. Female) Treating RN: Cornell Barman Primary Care Physician: Ria Bush Other Clinician: Referring Physician: Ria Bush Treating Physician/Extender: Frann Rider in Treatment: 18 Wound Status Wound Number: 1 Primary Etiology: Venous Leg Ulcer Wound Location: Right, Medial Lower Leg Wound Status: Open Wounding Event: Gradually Appeared Date Acquired: 04/04/2014 Weeks Of Treatment: 18 Clustered Wound: No Photos Photo Uploaded  ByGretta Cool, RN, BSN, Kim on 12/19/2014 17:12:43 Wound Measurements Length: (cm) 4 Width: (cm) 1 Depth: (cm)  0.2 Area: (cm) 3.142 Volume: (cm) 0.628 % Reduction in Area: 46.7% % Reduction in Volume: 64.5% Wound Description Full Thickness Without Exposed Classification: Support Structures Periwound Skin Texture Texture Color No Abnormalities Noted: No No Abnormalities Noted: No Moisture No Abnormalities Noted: No Treatment Notes Wound #1 (Right, Medial Lower Leg) Ureta, Zamira C. ([id number]) 1. Cleansed with: Clean wound with Normal Saline 2. Anesthetic Topical Lidocaine 4% cream to wound bed prior to debridement 4. Dressing Applied: Mepitel 7. Secured with 2 Layer Lite Compression System - Right Lower Extremity Notes xtra sorb Electronic Signature(s) Signed: 12/19/2014 5:32:35 PM By: Gretta Cool, RN, BSN, Kim RN, BSN Entered By: Gretta Cool, RN, BSN, Kim on 12/19/2014 16:31:13 Steffey, [name] ([id number]) -------------------------------------------------------------------------------- Wound Assessment Details Patient Name: Bradway, Ilisa C. Date of Service: 12/19/2014 4:00 PM Medical Record Number: [id number] Patient Account Number: [id number] Date of Birth/Sex: October 19, 1927 (81 y.o. Female) Treating RN: Cornell Barman Primary Care Physician: Ria Bush Other Clinician: Referring Physician: Ria Bush Treating Physician/Extender: Frann Rider in Treatment: 18 Wound Status Wound Number: 2 Primary Etiology: Skin Tear Wound Location: Left Forearm Wound Status: Open Wounding Event: Trauma Date Acquired: 12/10/2014 Weeks Of Treatment: 1 Clustered Wound: No Photos Photo Uploaded By: Gretta Cool, RN, BSN, Kim on 12/19/2014 17:12:43 Wound Measurements Length: (cm) 1.5 Width: (cm) 1 Depth: (cm) 0.2 Area: (cm) 1.178 Volume: (cm) 0.236 % Reduction in Area: 58.3% % Reduction in Volume: 16.6% Wound Description Classification: Partial Thickness Periwound Skin Texture Texture Color No Abnormalities Noted: No No Abnormalities Noted: No Moisture No Abnormalities  Noted: No Treatment Notes Wound #2 (Left Forearm) 1. Cleansed with: Tauer, Ashiya C. ([id number]) Clean wound with Normal Saline 2. Anesthetic Topical Lidocaine 4% cream to wound bed prior to debridement 4. Dressing Applied: Mepitel 7. Secured with 2 Layer Lite Compression System - Right Lower Extremity Notes xtra sorb Electronic Signature(s) Signed: 12/19/2014 5:32:35 PM By: Gretta Cool, RN, BSN, Kim RN, BSN Entered By: Gretta Cool, RN, BSN, Kim on 12/19/2014 16:31:13 Power, [name] ([id number]) -------------------------------------------------------------------------------- [place] Details Patient Name: Pullara, Arihanna C. Date of Service: 12/19/2014 4:00 PM Medical Record Number: [id number] Patient Account Number: [id number] Date of Birth/Sex: 07-Oct-1927 (81 y.o. Female) Treating RN: Cornell Barman Primary Care Physician: Ria Bush Other Clinician: Referring Physician: Ria Bush Treating Physician/Extender: Frann Rider in Treatment: 18 Vital Signs Time Taken: 16:22 Temperature (F): 97.6 Height (in): 58 Pulse (bpm): 68 Weight (lbs): 151 Respiratory Rate (breaths/min): 18 Body Mass Index (BMI): 31.6 Blood Pressure (mmHg): 137/61 Reference Range: 80 - 120 mg / dl Electronic Signature(s) Signed: 12/19/2014 5:32:35 PM By: Gretta Cool, RN, BSN, Kim RN, BSN Entered By: Gretta Cool, RN, BSN, Kim on 12/19/2014 [id number]

## 2014-12-20 NOTE — Progress Notes (Addendum)
ILISSA, BONNIN ([id number]) Visit Report for 12/19/2014 Chief Complaint Document Details Patient Name: [name], [name] Date of Service: 12/19/2014 4:00 PM Medical Record Number: [id number] Patient Account Number: [id number] Date of Birth/Sex: Oct 31, 1927 (81 y.o. Female) Treating RN: Cornell Barman Primary Care Physician: Ria Bush Other Clinician: Referring Physician: Ria Bush Treating Physician/Extender: Frann Rider in Treatment: 18 Information Obtained from: Patient Chief Complaint Patient presents for treatment of an open ulcer due to venous insufficiency. 81 year old patient who is known to me from the [facility] wound care center now comes in follow up here for a chronic venous ulceration of her right lower extremity which she's had for about 6 months. Electronic Signature(s) Signed: 12/19/2014 4:23:28 PM By: Christin Fudge MD, FACS Entered By: Christin Fudge on 12/19/2014 16:23:27 Ramnath, [name] ([id number]) -------------------------------------------------------------------------------- HPI Details Patient Name: [name], [name] Date of Service: 12/19/2014 4:00 PM Medical Record Number: [id number] Patient Account Number: [id number] Date of Birth/Sex: 08/12/1927 (81 y.o. Female) Treating RN: Cornell Barman Primary Care Physician: Ria Bush Other Clinician: Referring Physician: Ria Bush Treating Physician/Extender: Frann Rider in Treatment: 18 History of Present Illness Location: ulcer in the right lower extremity for several months Quality: Patient reports experiencing a dull pain to affected area(s). Severity: Patient states wound (s) are getting better. Duration: Patient has had the wound for > 3 months prior to seeking treatment at the wound center Timing: Pain in wound is Intermittent (comes and goes Context: The wound appeared gradually over time Modifying Factors: Consults to this date include: vascular surgery for the  veins in December 2015 and she's been seeing the wound care center at [facility] for several months. Associated Signs and Symptoms: Patient reports having difficulty standing for long periods. HPI Description: This is an 81 yrs old female we have been following since the beginning of January for a wound on the right medial ankle. She had previously had venous ablation of the greater saphenous vein by Dr. Kellie Simmering. She came to [place] with erythema of the right leg and subsequently is opened in the medial aspect of the right ankle. She has not been very compliant with compression. We ordered her at juxta lite stocking, however she apparently has not been able to wear it. The area on the medial aspect of her left ankle has much less induration and erythema. The edema is better controlled. No evidence of infection is noted. 07/18/14; no major change in the condition of this wound. There is still surface slough and probably some drainage. 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 All other veins showed no evidence of DVT, superficial venous thrombosis or incompetence. however she does have manifestations of chronic venous hypertension and [name] believe we will change her dressing over to Prisma and a Profore light dressing. she sees her primary care doctor regularly and her last hemoglobin A1c has been around 7%.her other comorbidities are hypertension, CABG in 1998, peptic ulcer disease, history of echo with a ejection fraction of 55-60% and grade 1 diastolic dysfunction. past surgical history include coronary artery bypass graft in 1998, cholecystectomy, appendectomy, hand surgery, endovenous ablation of the saphenous vein with laser by Dr. Kellie Simmering in December 2015. 08/29/2014 - she complains of increased calf pain over the past week. removed her compression bandage and pain improved. no  fever or chills. Mild clear drainage. 09/04/2014 -- she has not been compliant and  has not done a dressing change as we had ordered last week. She always complains and does not keep her wrap on and this past week she has done the same. She has completed a course of doxycycline. 09/25/2014 -- she has tolerated her dressing all week and has not removed it. She says it's hurting a lot today and would not like any debridement. We will do so as per her wishes. she did get a appointment with the vascular surgeon and sees him on 6/28. [name], [name] ([id number]) 10/03/2014 - She was seen by Dr. Tinnie Gens 09/30/2014. Assessment: [name] reviewed the previous venous duplex exam study performed in September 2015 which reveals no options for treating this venous ulcer. There is no significant superficial venous reflux to [name] with laser ablation.Most recent ABI was 0.91 in the right foot with palpable dorsalis pedis pulse at 3+ Plan: patient needs to return to the [place] wound center and be treated by Dr. Con Memos. No arterial or venous options available from vascular standpoint or treatment of right leg stasis ulcer.Continued dressing changes and compression is the treatment of choice 10/16/2014 -- today will be her first application of Apligraf. 10/23/2014 -- we are going to be able to apply her second day of Apligraf next week. She is complaining of some tenderness in the region of Achilles tendon and this is possibly due to her wrap. 11/06/2014 -- she did not have Apligraf last week because the wound was not looking good but she says she is feeling much better now. 11/13/2014 -- she has been doing well this week and she is here for her second Apligraf. 11/27/2014 -- she is here for her third application of Apligraf 12/12/2014 -- she is here for her fourth application of Apligraf. Also during these past 3 days she had an injury to her left forearm which she caught against the edge of her desk and  has a lacerated wound which has gone down to the subcutaneous tissue. Electronic Signature(s) Signed: 12/19/2014 4:43:15 PM By: Christin Fudge MD, FACS Entered By: Christin Fudge on 12/19/2014 16:43:15 [name], [name] ([id number]) -------------------------------------------------------------------------------- Physical Exam Details Patient Name: [name], [name] Date of Service: 12/19/2014 4:00 PM Medical Record Number: [id number] Patient Account Number: [id number] Date of Birth/Sex: 09-27-1927 (81 y.o. Female) Treating RN: Cornell Barman Primary Care Physician: Ria Bush Other Clinician: Referring Physician: Ria Bush Treating Physician/Extender: Frann Rider in Treatment: 18 Constitutional . Pulse regular. Respirations normal and unlabored. Afebrile. . Eyes Nonicteric. Reactive to light. Ears, Nose, Mouth, and Throat Lips, teeth, and gums WNL.Marland Kitchen Moist mucosa without lesions . Neck supple and nontender. No palpable supraclavicular or cervical adenopathy. Normal sized without goiter. Respiratory WNL. No retractions.. Cardiovascular Pedal Pulses WNL. No clubbing, cyanosis or edema. Lymphatic No adneopathy. No adenopathy. No adenopathy. Musculoskeletal Adexa without tenderness or enlargement.. Digits and nails w/o clubbing, cyanosis, infection, petechiae, ischemia, or inflammatory conditions.. Integumentary (Hair, Skin) No suspicious lesions. No crepitus or fluctuance. No peri-wound warmth or erythema. No masses.Marland Kitchen Psychiatric Judgement and insight Intact.. No evidence of depression, anxiety, or agitation.. Notes The wound on her right lower extremity is covered with Apligraf and we will only change the bolster and cover it with a 2 layer compression wrap. The left forearm where she had a laceration is looking clean and we will use Prisma and Mepitel to dress this with the bordered foam. Electronic Signature(s) Signed: 12/19/2014 4:44:11 PM By: Christin Fudge MD,  FACS Entered By: Christin Fudge on 12/19/2014 16:44:11 Blankenbaker, Iceis C. (  [id number]) -------------------------------------------------------------------------------- Physician Orders Details Patient Name: [name], [name] Date of Service: 12/19/2014 4:00 PM Medical Record Number: [id number] Patient Account Number: [id number] Date of Birth/Sex: 08-Feb-1928 (81 y.o. Female) Treating RN: Cornell Barman Primary Care Physician: Ria Bush Other Clinician: Referring Physician: Ria Bush Treating Physician/Extender: Frann Rider in Treatment: 26 Verbal / Phone Orders: Yes Clinician: Cornell Barman Read Back and Verified: Yes Diagnosis Coding ICD-10 Coding Code Description E11.622 Type 2 diabetes mellitus with other skin ulcer E11.610 Type 2 diabetes mellitus with diabetic neuropathic arthropathy Chronic venous hypertension (idiopathic) with ulcer and inflammation of right lower I87.331 extremity L03.115 Cellulitis of right lower limb S41.112A Laceration without foreign body of left upper arm, initial encounter Wound Cleansing Wound #1 Right,Medial Lower Leg o Cleanse wound with mild soap and water Wound #2 Left Forearm o Cleanse wound with mild soap and water Anesthetic Wound #1 Right,Medial Lower Leg o Topical Lidocaine 4% cream applied to wound bed prior to debridement Wound #2 Left Forearm o Topical Lidocaine 4% cream applied to wound bed prior to debridement Primary Wound Dressing Wound #2 Left Forearm o Mepitel One Wound #1 Right,Medial Lower Leg o XtraSorb Secondary Dressing Wound #2 Left Forearm o Boardered Foam Dressing Dressing Change Frequency Trotta, Lekeya C. ([id number]) Wound #1 Right,Medial Lower Leg o Change dressing every week Wound #2 Left Forearm o Change dressing every week Follow-up Appointments Wound #1 Right,Medial Lower Leg o Return Appointment in 1 week. Wound #2 Left Forearm o Return Appointment in 1  week. Edema Control Wound #1 Right,Medial Lower Leg o 2 Layer Lite Compression System - Right Lower Extremity Additional Orders / Instructions Wound #1 Right,Medial Lower Leg o Increase protein intake. Electronic Signature(s) Signed: 12/19/2014 4:45:33 PM By: Christin Fudge MD, FACS Signed: 12/19/2014 5:32:35 PM By: Gretta Cool RN, BSN, Kim RN, BSN Entered By: Gretta Cool, RN, BSN, Kim on 12/19/2014 16:41:30 [name], [name] ([id number]) -------------------------------------------------------------------------------- Problem List Details Patient Name: [name], [name] Date of Service: 12/19/2014 4:00 PM Medical Record Number: [id number] Patient Account Number: [id number] Date of Birth/Sex: [birth year] (81 y.o. Female) Treating RN: Cornell Barman Primary Care Physician: Ria Bush Other Clinician: Referring Physician: Ria Bush Treating Physician/Extender: Frann Rider in Treatment: 18 Active Problems ICD-10 Encounter Code Description Active Date Diagnosis E11.622 Type 2 diabetes mellitus with other skin ulcer 08/15/2014 Yes E11.610 Type 2 diabetes mellitus with diabetic neuropathic 08/15/2014 Yes arthropathy I87.331 Chronic venous hypertension (idiopathic) with ulcer and 08/15/2014 Yes inflammation of right lower extremity L03.115 Cellulitis of right lower limb 08/29/2014 Yes S41.112A Laceration without foreign body of left upper arm, initial 12/12/2014 Yes encounter Inactive Problems Resolved Problems Electronic Signature(s) Signed: 12/19/2014 4:23:21 PM By: Christin Fudge MD, FACS Entered By: Christin Fudge on 12/19/2014 16:23:21 Rudge, [name] ([id number]) -------------------------------------------------------------------------------- Progress Note Details Patient Name: [name], [name] Date of Service: 12/19/2014 4:00 PM Medical Record Number: [id number] Patient Account Number: [id number] Date of Birth/Sex: 1927/04/15 (81 y.o. Female) Treating RN: Cornell Barman Primary Care Physician: Ria Bush Other Clinician: Referring Physician: Ria Bush Treating Physician/Extender: Frann Rider in Treatment: 18 Subjective Chief Complaint Information obtained from Patient Patient presents for treatment of an open ulcer due to venous insufficiency. 81 year old patient who is known to me from the [facility] wound care center now comes in follow up here for a chronic venous ulceration of her right lower extremity which she's had for about 6 months. History of Present Illness (HPI) The following HPI elements were documented for the patient's wound: Location: ulcer in the right lower  extremity for several months Quality: Patient reports experiencing a dull pain to affected area(s). Severity: Patient states wound (s) are getting better. Duration: Patient has had the wound for > 3 months prior to seeking treatment at the wound center Timing: Pain in wound is Intermittent (comes and goes Context: The wound appeared gradually over time Modifying Factors: Consults to this date include: vascular surgery for the veins in December 2015 and she's been seeing the wound care center at [facility] for several months. Associated Signs and Symptoms: Patient reports having difficulty standing for long periods. This is an 81 yrs old female we have been following since the beginning of January for a wound on the right medial ankle. She had previously had venous ablation of the greater saphenous vein by Dr. Kellie Simmering. She came to [place] with erythema of the right leg and subsequently is opened in the medial aspect of the right ankle. She has not been very compliant with compression. We ordered her at juxta lite stocking, however she apparently has not been able to wear it. The area on the medial aspect of her left ankle has much less induration and erythema. The edema is better controlled. No evidence of infection is noted. 07/18/14; no major change in the  condition of this wound. There is still surface slough and probably some drainage. 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 All other veins showed no evidence of DVT, superficial venous thrombosis or incompetence. however she does have manifestations of chronic venous hypertension and [name] believe we will change her dressing over to Prisma and a Profore light dressing. she sees her primary care doctor regularly and her last hemoglobin A1c has been around 7%.her other comorbidities are hypertension, CABG in 1998, peptic ulcer disease, history of echo with a ejection fraction of 55-60% and grade 1 diastolic dysfunction. past surgical history include coronary artery bypass graft in 1998, cholecystectomy, appendectomy, hand surgery, endovenous ablation of the saphenous vein with laser by Dr. Kellie Simmering in December 2015. [name], [name] 08/29/2014 - she complains of increased calf pain over the past week. removed her compression bandage and pain improved. no fever or chills. Mild clear drainage. 09/04/2014 -- she has not been compliant and has not done a dressing change as we had ordered last week. She always complains and does not keep her wrap on and this past week she has done the same. She has completed a course of doxycycline. 09/25/2014 -- she has tolerated her dressing all week and has not removed it. She says it's hurting a lot today and would not like any debridement. We will do so as per her wishes. she did get a appointment with the vascular surgeon and sees him on 6/28. 10/03/2014 - She was seen by Dr. Tinnie Gens 09/30/2014. Assessment: [name] reviewed the previous venous duplex exam study performed in September 2015 which reveals no options for treating this venous ulcer. There is no significant superficial venous reflux to [name] with laser ablation.Most  recent ABI was 0.91 in the right foot with palpable dorsalis pedis pulse at 3+ Plan: patient needs to return to the [place] wound center and be treated by Dr. Con Memos. No arterial or venous options available from vascular standpoint or treatment of right leg stasis ulcer.Continued dressing changes and compression is the treatment of choice 10/16/2014 -- today will be her first application of Apligraf.  10/23/2014 -- we are going to be able to apply her second day of Apligraf next week. She is complaining of some tenderness in the region of Achilles tendon and this is possibly due to her wrap. 11/06/2014 -- she did not have Apligraf last week because the wound was not looking good but she says she is feeling much better now. 11/13/2014 -- she has been doing well this week and she is here for her second Apligraf. 11/27/2014 -- she is here for her third application of Apligraf 12/12/2014 -- she is here for her fourth application of Apligraf. Also during these past 3 days she had an injury to her left forearm which she caught against the edge of her desk and has a lacerated wound which has gone down to the subcutaneous tissue. Objective Constitutional Pulse regular. Respirations normal and unlabored. Afebrile. Vitals Time Taken: 4:22 PM, Height: 58 in, Weight: 151 lbs, BMI: 31.6, Temperature: 97.6 F, Pulse: 68 bpm, Respiratory Rate: 18 breaths/min, Blood Pressure: 137/61 mmHg. Eyes Nonicteric. Reactive to light. Ears, Nose, Mouth, and Throat Lips, teeth, and gums WNL.Marland Kitchen Moist mucosa without lesions . [name], [name] ([id number]) Neck supple and nontender. No palpable supraclavicular or cervical adenopathy. Normal sized without goiter. Respiratory WNL. No retractions.. Cardiovascular Pedal Pulses WNL. No clubbing, cyanosis or edema. Lymphatic No adneopathy. No adenopathy. No adenopathy. Musculoskeletal Adexa without tenderness or enlargement.. Digits and nails w/o clubbing, cyanosis,  infection, petechiae, ischemia, or inflammatory conditions.Marland Kitchen Psychiatric Judgement and insight Intact.. No evidence of depression, anxiety, or agitation.. General Notes: The wound on her right lower extremity is covered with Apligraf and we will only change the bolster and cover it with a 2 layer compression wrap. The left forearm where she had a laceration is looking clean and we will use Prisma and Mepitel to dress this with the bordered foam. Integumentary (Hair, Skin) No suspicious lesions. No crepitus or fluctuance. No peri-wound warmth or erythema. No masses.. Wound #1 status is Open. Original cause of wound was Gradually Appeared. The wound is located on the Right,Medial Lower Leg. The wound measures 4cm length x 1cm width x 0.2cm depth; 3.142cm^2 area and 0.628cm^3 volume. Wound #2 status is Open. Original cause of wound was Trauma. The wound is located on the Left Forearm. The wound measures 1.5cm length x 1cm width x 0.2cm depth; 1.178cm^2 area and 0.236cm^3 volume. Assessment Active Problems ICD-10 E11.622 - Type 2 diabetes mellitus with other skin ulcer E11.610 - Type 2 diabetes mellitus with diabetic neuropathic arthropathy I87.331 - Chronic venous hypertension (idiopathic) with ulcer and inflammation of right lower extremity L03.115 - Cellulitis of right lower limb S41.112A - Laceration without foreign body of left upper arm, initial encounter [name], Icela C. ([id number]) The wound on her right lower extremity is covered with Apligraf and we will only change the bolster and cover it with a 2 layer compression wrap. The left forearm where she had a laceration is looking clean and we will use Prisma and Mepitel to dress this with the bordered foam. She will be here next week for her last application of Apligraf Plan Wound Cleansing: Wound #1 Right,Medial Lower Leg: Cleanse wound with mild soap and water Wound #2 Left Forearm: Cleanse wound with mild soap and  water Anesthetic: Wound #1 Right,Medial Lower Leg: Topical Lidocaine 4% cream applied to wound bed prior to debridement Wound #2 Left Forearm: Topical Lidocaine 4% cream applied to wound bed prior to debridement Primary Wound Dressing: Wound #2 Left Forearm: Mepitel One Wound #1  Right,Medial Lower Leg: XtraSorb Secondary Dressing: Wound #2 Left Forearm: Boardered Foam Dressing Dressing Change Frequency: Wound #1 Right,Medial Lower Leg: Change dressing every week Wound #2 Left Forearm: Change dressing every week Follow-up Appointments: Wound #1 Right,Medial Lower Leg: Return Appointment in 1 week. Wound #2 Left Forearm: Return Appointment in 1 week. Edema Control: Wound #1 Right,Medial Lower Leg: 2 Layer Lite Compression System - Right Lower Extremity Additional Orders / Instructions: Wound #1 Right,Medial Lower Leg: Increase protein intake. Fittro, Elaria C. ([id number]) The wound on her right lower extremity is covered with Apligraf and we will only change the bolster and cover it with a 2 layer compression wrap. The left forearm where she had a laceration is looking clean and we will use Prisma and Mepitel to dress this with the bordered foam. She will be here next week for her last application of Apligraf Electronic Signature(s) Signed: 12/19/2014 4:44:53 PM By: Christin Fudge MD, FACS Entered By: Christin Fudge on 12/19/2014 16:44:53 Mclarty, [name] -------------------------------------------------------------------------------- SuperBill Details Patient Name: Cross, Khamiya C. Date of Service: 12/19/2014 Medical Record Number: [id number] Patient Account Number: [id number] Date of Birth/Sex: [birth year] (81 y.o. Female) Treating RN: Cornell Barman Primary Care Physician: Ria Bush Other Clinician: Referring Physician: Ria Bush Treating Physician/Extender: Frann Rider in Treatment: 18 Diagnosis Coding ICD-10 Codes Code  Description E11.622 Type 2 diabetes mellitus with other skin ulcer E11.610 Type 2 diabetes mellitus with diabetic neuropathic arthropathy Chronic venous hypertension (idiopathic) with ulcer and inflammation of right lower I87.331 extremity L03.115 Cellulitis of right lower limb S41.112A Laceration without foreign body of left upper arm, initial encounter Facility Procedures CPT4: Description Modifier Quantity Code [id number] (Facility Use Only) [phone] - [facility] RT 1 LEG Physician Procedures CPT4: Description Modifier Quantity Code [id number] - WC PHYS LEVEL 3 - EST PT 1 ICD-10 Description Diagnosis E11.622 Type 2 diabetes mellitus with other skin ulcer I87.331 Chronic venous hypertension (idiopathic) with ulcer and inflammation of right  lower extremity S41.112A Laceration without foreign body of left upper arm, initial encounter Electronic Signature(s) Signed: 12/19/2014 4:45:10 PM By: Christin Fudge MD, FACS Entered By: Christin Fudge on 12/19/2014 16:45:10

## 2014-12-23 ENCOUNTER — Other Ambulatory Visit: Payer: Self-pay | Admitting: Gynecology

## 2014-12-26 ENCOUNTER — Encounter: Payer: Medicare Other | Admitting: Surgery

## 2014-12-26 DIAGNOSIS — E11610 Type 2 diabetes mellitus with diabetic neuropathic arthropathy: Secondary | ICD-10-CM | POA: Diagnosis not present

## 2014-12-26 DIAGNOSIS — L03115 Cellulitis of right lower limb: Secondary | ICD-10-CM | POA: Diagnosis not present

## 2014-12-26 DIAGNOSIS — E11622 Type 2 diabetes mellitus with other skin ulcer: Secondary | ICD-10-CM | POA: Diagnosis not present

## 2014-12-26 DIAGNOSIS — L97211 Non-pressure chronic ulcer of right calf limited to breakdown of skin: Secondary | ICD-10-CM | POA: Diagnosis not present

## 2014-12-26 DIAGNOSIS — I10 Essential (primary) hypertension: Secondary | ICD-10-CM | POA: Diagnosis not present

## 2014-12-26 DIAGNOSIS — I87331 Chronic venous hypertension (idiopathic) with ulcer and inflammation of right lower extremity: Secondary | ICD-10-CM | POA: Diagnosis not present

## 2014-12-27 NOTE — Progress Notes (Addendum)
AYSA, CRUMBLISS ([id number]) Visit Report for 12/26/2014 Chief Complaint Document Details Patient Name: [name], [name] Date of Service: 12/26/2014 2:00 PM Medical Record Number: [id number] Patient Account Number: [id number] Date of Birth/Sex: [birth year] (81 y.o. Female) Treating RN: Baruch Gouty, RN, BSN, Velva Harman Primary Care Physician: Ria Bush Other Clinician: Referring Physician: Ria Bush Treating Physician/Extender: Frann Rider in Treatment: 19 Information Obtained from: Patient Chief Complaint Patient presents for treatment of an open ulcer due to venous insufficiency. 81 year old patient who is known to me from the [facility] wound care center now comes in follow up here for a chronic venous ulceration of her right lower extremity which she's had for about 6 months. Electronic Signature(s) Signed: 12/26/2014 2:36:29 PM By: Christin Fudge MD, FACS Entered By: Christin Fudge on 12/26/2014 14:36:29 Bennick, [name] -------------------------------------------------------------------------------- Cellular or Tissue Based Product Details Patient Name: Jarchow, [name] Date of Service: 12/26/2014 2:00 PM Medical Record Number: [id number] Patient Account Number: [id number] Date of Birth/Sex: [birth year] (81 y.o. Female) Treating RN: Afful, RN, BSN, Velva Harman Primary Care Physician: Ria Bush Other Clinician: Referring Physician: Ria Bush Treating Physician/Extender: Frann Rider in Treatment: 19 Cellular or Tissue Based Wound #1 Right,Medial Lower Leg Product Type Applied to: Performed By: Physician Pat Patrick., MD Cellular or Tissue Based Apligraf Product Type: Time-Out Taken: Yes Location: trunk / arms / legs Wound Size (sq cm): 6.11 Product Size (sq cm): 44 Waste Size (sq cm): 37 Waste Reason: product size Amount of Product Applied (sq cm): 7 Lot #: [id number] Order #: [id number] Expiration Date:  01/01/2015 Fenestrated: Yes Instrument: Blade Reconstituted: Yes Solution Type: NACL Solution Amount: 5ML Lot #: [id number] Solution Expiration 08/02/2016 Date: Secured: Yes Secured With: Steri-Strips Dressing Applied: Yes Primary Dressing: MEPITEL Procedural Pain: 0 Post Procedural Pain: 0 Response to Treatment: Procedure was tolerated well Post Procedure Diagnosis Same as Pre-procedure Electronic Signature(s) Signed: 12/26/2014 2:36:23 PM By: Christin Fudge MD, FACS Entered By: Christin Fudge on 12/26/2014 14:36:23 Milsap, [name] [name], [name] -------------------------------------------------------------------------------- HPI Details Patient Name: Kercher, [name] Date of Service: 12/26/2014 2:00 PM Medical Record Number: [id number] Patient Account Number: [id number] Date of Birth/Sex: [birth year] (81 y.o. Female) Treating RN: Afful, RN, BSN, Velva Harman Primary Care Physician: Ria Bush Other Clinician: Referring Physician: Ria Bush Treating Physician/Extender: Frann Rider in Treatment: 19 History of Present Illness Location: ulcer in the right lower extremity for several months Quality: Patient reports experiencing a dull pain to affected area(s). Severity: Patient states wound (s) are getting better. Duration: Patient has had the wound for > 3 months prior to seeking treatment at the wound center Timing: Pain in wound is Intermittent (comes and goes Context: The wound appeared gradually over time Modifying Factors: Consults to this date include: vascular surgery for the veins in December 2015 and she's been seeing the wound care center at [facility] for several months. Associated Signs and Symptoms: Patient reports having difficulty standing for long periods. HPI Description: This is an 81 yrs old female we have been following since the beginning of January for a wound on the right medial ankle. She had previously had venous  ablation of the greater saphenous vein by Dr. Kellie Simmering. She came to [place] with erythema of the right leg and subsequently is opened in the medial aspect of the right ankle. She has not been very compliant with compression. We ordered her at juxta lite stocking, however she apparently has not been able to wear it. The area on the medial aspect of  her left ankle has much less induration and erythema. The edema is better controlled. No evidence of infection is noted. 07/18/14; no major change in the condition of this wound. There is still surface slough and probably some drainage. 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 All other veins showed no evidence of DVT, superficial venous thrombosis or incompetence. however she does have manifestations of chronic venous hypertension and I believe we will change her dressing over to Prisma and a Profore light dressing. she sees her primary care doctor regularly and her last hemoglobin A1c has been around 7%.her other comorbidities are hypertension, CABG in 1998, peptic ulcer disease, history of echo with a ejection fraction of 55-60% and grade 1 diastolic dysfunction. past surgical history include coronary artery bypass graft in 1998, cholecystectomy, appendectomy, hand surgery, endovenous ablation of the saphenous vein with laser by Dr. Kellie Simmering in December 2015. 08/29/2014 - she complains of increased calf pain over the past week. removed her compression bandage and pain improved. no fever or chills. Mild clear drainage. 09/04/2014 -- she has not been compliant and has not done a dressing change as we had ordered last week. She always complains and does not keep her wrap on and this past week she has done the same. She has completed a course of doxycycline. 09/25/2014 -- she has tolerated her dressing all week and has not removed it. She  says it's hurting a lot today and would not like any debridement. We will do so as per her wishes. she did get a appointment with the vascular surgeon and sees him on 6/28. [name], [name] ([id number]) 10/03/2014 - She was seen by Dr. Tinnie Gens 09/30/2014. Assessment: I reviewed the previous venous duplex exam study performed in September 2015 which reveals no options for treating this venous ulcer. There is no significant superficial venous reflux to treat with laser ablation.Most recent ABI was 0.91 in the right foot with palpable dorsalis pedis pulse at 3+ Plan: patient needs to return to the [facility] wound center and be treated by Dr. Con Memos. No arterial or venous options available from vascular standpoint or treatment of right leg stasis ulcer.Continued dressing changes and compression is the treatment of choice 10/16/2014 -- today will be her first application of Apligraf. 10/23/2014 -- we are going to be able to apply her second day of Apligraf next week. She is complaining of some tenderness in the region of Achilles tendon and this is possibly due to her wrap. 11/06/2014 -- she did not have Apligraf last week because the wound was not looking good but she says she is feeling much better now. 11/13/2014 -- she has been doing well this week and she is here for her second Apligraf. 11/27/2014 -- she is here for her third application of Apligraf 12/12/2014 -- she is here for her fourth application of Apligraf. Also during these past 3 days she had an injury to her left forearm which she caught against the edge of her desk and has a lacerated wound which has gone down to the subcutaneous tissue. 12/26/2014 -- she is here for a last/fifth application of Apligraf. Electronic Signature(s) Signed: 12/26/2014 2:37:14 PM By: Christin Fudge MD, FACS Entered By: Christin Fudge on 12/26/2014 14:37:14 [place]au, [name]  ([id number]) -------------------------------------------------------------------------------- Physical Exam Details Patient Name: Silva, Daci C. Date of Service: 12/26/2014 2:00 PM Medical Record Number:  [id number] Patient Account Number: [id number] Date of Birth/Sex: [birth year] (81 y.o. Female) Treating RN: Baruch Gouty, RN, BSN, Velva Harman Primary Care Physician: Ria Bush Other Clinician: Referring Physician: Ria Bush Treating Physician/Extender: Frann Rider in Treatment: 19 Constitutional . Pulse regular. Respirations normal and unlabored. Afebrile. . Eyes Nonicteric. Reactive to light. Ears, Nose, Mouth, and Throat Lips, teeth, and gums WNL.Marland Kitchen Moist mucosa without lesions . Neck supple and nontender. No palpable supraclavicular or cervical adenopathy. Normal sized without goiter. Respiratory WNL. No retractions.. Cardiovascular Pedal Pulses WNL. No clubbing, cyanosis or edema. Lymphatic No adneopathy. No adenopathy. No adenopathy. Musculoskeletal Adexa without tenderness or enlargement.. Digits and nails w/o clubbing, cyanosis, infection, petechiae, ischemia, or inflammatory conditions.. Integumentary (Hair, Skin) No suspicious lesions. No crepitus or fluctuance. No peri-wound warmth or erythema. No masses.Marland Kitchen Psychiatric Judgement and insight Intact.. No evidence of depression, anxiety, or agitation.. Notes Her wound looks clean and there is no surrounding cellulitis though she complains of a lot of pain Electronic Signature(s) Signed: 12/26/2014 2:37:42 PM By: Christin Fudge MD, FACS Entered By: Christin Fudge on 12/26/2014 14:37:42 [name], [name] ([id number]) -------------------------------------------------------------------------------- Physician Orders Details Patient Name: Marut, Valentine C. Date of Service: 12/26/2014 2:00 PM Medical Record Number: [id number] Patient Account Number: [id number] Date of Birth/Sex: 09/19/1927 (81 y.o. Female) Treating RN:  Cornell Barman Primary Care Physician: Ria Bush Other Clinician: Referring Physician: Ria Bush Treating Physician/Extender: Frann Rider in Treatment: 27 Verbal / Phone Orders: Yes Clinician: Cornell Barman Read Back and Verified: Yes Diagnosis Coding ICD-10 Coding Code Description E11.622 Type 2 diabetes mellitus with other skin ulcer E11.610 Type 2 diabetes mellitus with diabetic neuropathic arthropathy Chronic venous hypertension (idiopathic) with ulcer and inflammation of right lower I87.331 extremity L03.115 Cellulitis of right lower limb S41.112A Laceration without foreign body of left upper arm, initial encounter Wound Cleansing Wound #1 Right,Medial Lower Leg o Cleanse wound with mild soap and water Wound #2 Left Forearm o Cleanse wound with mild soap and water Anesthetic Wound #1 Right,Medial Lower Leg o Topical Lidocaine 4% cream applied to wound bed prior to debridement Wound #2 Left Forearm o Topical Lidocaine 4% cream applied to wound bed prior to debridement Primary Wound Dressing o Mepitel One Wound #1 Right,Medial Lower Leg o Drawtex Secondary Dressing Wound #2 Left Forearm o Boardered Foam Dressing Dressing Change Frequency Wound #1 Right,Medial Lower Leg o Change dressing every week [name], [name] ([id number]) Wound #2 Left Forearm o Change dressing every week Follow-up Appointments Wound #1 Right,Medial Lower Leg o Return Appointment in 1 week. Wound #2 Left Forearm o Return Appointment in 1 week. Edema Control Wound #1 Right,Medial Lower Leg o 2 Layer Lite Compression System - Right Lower Extremity Additional Orders / Instructions Wound #1 Right,Medial Lower Leg o Increase protein intake. Advanced Therapies Wound #1 Right,Medial Lower Leg o Apligraf application in clinic; including contact layer, fixation with steri strips, dry gauze and cover dressing. Electronic Signature(s) Signed:  12/26/2014 4:15:54 PM By: Christin Fudge MD, FACS Signed: 12/26/2014 5:14:03 PM By: Gretta Cool RN, BSN, Kim RN, BSN Entered By: Gretta Cool, RN, BSN, Kim on 12/26/2014 15:38:36 [name], [name] ([id number]) -------------------------------------------------------------------------------- Problem List Details Patient Name: [name], [name] Date of Service: 12/26/2014 2:00 PM Medical Record Number: [id number] Patient Account Number: [id number] Date of Birth/Sex: Sep 19, 1927 (81 y.o. Female) Treating RN: Baruch Gouty, RN, BSN, Velva Harman Primary Care Physician: Ria Bush Other Clinician: Referring Physician: Ria Bush Treating Physician/Extender: Frann Rider in Treatment: 56 Active Problems ICD-10 Encounter Code Description Active Date Diagnosis E11.622 Type 2  diabetes mellitus with other skin ulcer 08/15/2014 Yes E11.610 Type 2 diabetes mellitus with diabetic neuropathic 08/15/2014 Yes arthropathy I87.331 Chronic venous hypertension (idiopathic) with ulcer and 08/15/2014 Yes inflammation of right lower extremity L03.115 Cellulitis of right lower limb 08/29/2014 Yes S41.112A Laceration without foreign body of left upper arm, initial 12/12/2014 Yes encounter Inactive Problems Resolved Problems Electronic Signature(s) Signed: 12/26/2014 2:36:15 PM By: Christin Fudge MD, FACS Entered By: Christin Fudge on 12/26/2014 14:36:15 Moen, [name] ([id number]) -------------------------------------------------------------------------------- Progress Note Details Patient Name: [name], [name] Date of Service: 12/26/2014 2:00 PM Medical Record Number: [id number] Patient Account Number: [id number] Date of Birth/Sex: 29-Mar-1928 (81 y.o. Female) Treating RN: Baruch Gouty, RN, BSN, Velva Harman Primary Care Physician: Ria Bush Other Clinician: Referring Physician: Ria Bush Treating Physician/Extender: Frann Rider in Treatment: 19 Subjective Chief Complaint Information obtained from  Patient Patient presents for treatment of an open ulcer due to venous insufficiency. 81 year old patient who is known to me from the [facility] wound care center now comes in follow up here for a chronic venous ulceration of her right lower extremity which she's had for about 6 months. History of Present Illness (HPI) The following HPI elements were documented for the patient's wound: Location: ulcer in the right lower extremity for several months Quality: Patient reports experiencing a dull pain to affected area(s). Severity: Patient states wound (s) are getting better. Duration: Patient has had the wound for > 3 months prior to seeking treatment at the wound center Timing: Pain in wound is Intermittent (comes and goes Context: The wound appeared gradually over time Modifying Factors: Consults to this date include: vascular surgery for the veins in December 2015 and she's been seeing the wound care center at [facility] for several months. Associated Signs and Symptoms: Patient reports having difficulty standing for long periods. This is an 81 yrs old female we have been following since the beginning of January for a wound on the right medial ankle. She had previously had venous ablation of the greater saphenous vein by Dr. Kellie Simmering. She came to [place] with erythema of the right leg and subsequently is opened in the medial aspect of the right ankle. She has not been very compliant with compression. We ordered her at juxta lite stocking, however she apparently has not been able to wear it. The area on the medial aspect of her left ankle has much less induration and erythema. The edema is better controlled. No evidence of infection is noted. 07/18/14; no major change in the condition of this wound. There is still surface slough and probably some drainage. The patient had a venous duplex study done on 04/02/2014 and at that stage it showed that she had a chronic thrombus in one of her for proximal  gastrocnemius veins. All other veins showed no evidence of DVT, superficial venous thrombosis or incompetence. however she does have manifestations of chronic venous hypertension and I believe we will change her dressing over to Prisma and a Profore light dressing. she sees her primary care doctor regularly and her last hemoglobin A1c has been around 7%.her other comorbidities are hypertension, CABG in 1998, peptic ulcer disease, history of echo with a ejection fraction of 55-60% and grade 1 diastolic dysfunction. past surgical history include coronary artery bypass graft in 1998, cholecystectomy, appendectomy, hand surgery, endovenous ablation of the saphenous vein with laser by Dr. Kellie Simmering in December 2015. JOSHUA, AMBRIZ ([id number]) 08/29/2014 - she complains of increased calf pain over the past week. removed her compression bandage and pain improved.  no fever or chills. Mild clear drainage. 09/04/2014 -- she has not been compliant and has not done a dressing change as we had ordered last week. She always complains and does not keep her wrap on and this past week she has done the same. She has completed a course of doxycycline. 09/25/2014 -- she has tolerated her dressing all week and has not removed it. She says it's hurting a lot today and would not like any debridement. We will do so as per her wishes. she did get a appointment with the vascular surgeon and sees him on 6/28. 10/03/2014 - She was seen by Dr. Tinnie Gens 09/30/2014. Assessment: I reviewed the previous venous duplex exam study performed in September 2015 which reveals no options for treating this venous ulcer. There is no significant superficial venous reflux to treat with laser ablation.Most recent ABI was 0.91 in the right foot with palpable dorsalis pedis pulse at 3+ Plan: patient needs to return to the [place] wound center and be treated by Dr. Con Memos. No arterial or venous options available from vascular standpoint  or treatment of right leg stasis ulcer.Continued dressing changes and compression is the treatment of choice 10/16/2014 -- today will be her first application of Apligraf. 10/23/2014 -- we are going to be able to apply her second day of Apligraf next week. She is complaining of some tenderness in the region of Achilles tendon and this is possibly due to her wrap. 11/06/2014 -- she did not have Apligraf last week because the wound was not looking good but she says she is feeling much better now. 11/13/2014 -- she has been doing well this week and she is here for her second Apligraf. 11/27/2014 -- she is here for her third application of Apligraf 12/12/2014 -- she is here for her fourth application of Apligraf. Also during these past 3 days she had an injury to her left forearm which she caught against the edge of her desk and has a lacerated wound which has gone down to the subcutaneous tissue. 12/26/2014 -- she is here for a last/fifth application of Apligraf. Objective Constitutional Pulse regular. Respirations normal and unlabored. Afebrile. Vitals Time Taken: 2:10 PM, Height: 58 in, Weight: 151 lbs, BMI: 31.6, Temperature: 98.3 F, Pulse: 70 bpm, Respiratory Rate: 18 breaths/min, Blood Pressure: 111/67 mmHg. Eyes Nonicteric. Reactive to light. Ears, Nose, Mouth, and Throat [name], [name] ([id number]) Lips, teeth, and gums WNL.Marland Kitchen Moist mucosa without lesions . Neck supple and nontender. No palpable supraclavicular or cervical adenopathy. Normal sized without goiter. Respiratory WNL. No retractions.. Cardiovascular Pedal Pulses WNL. No clubbing, cyanosis or edema. Lymphatic No adneopathy. No adenopathy. No adenopathy. Musculoskeletal Adexa without tenderness or enlargement.. Digits and nails w/o clubbing, cyanosis, infection, petechiae, ischemia, or inflammatory conditions.Marland Kitchen Psychiatric Judgement and insight Intact.. No evidence of depression, anxiety, or agitation.. General  Notes: Her wound looks clean and there is no surrounding cellulitis though she complains of a lot of pain Integumentary (Hair, Skin) No suspicious lesions. No crepitus or fluctuance. No peri-wound warmth or erythema. No masses.. Wound #1 status is Open. Original cause of wound was Gradually Appeared. The wound is located on the Right,Medial Lower Leg. The wound measures 4.7cm length x 1.3cm width x 0.2cm depth; 4.799cm^2 area and 0.96cm^3 volume. The wound is limited to skin breakdown. There is no tunneling or undermining noted. There is a medium amount of serosanguineous drainage noted. The wound margin is distinct with the outline attached to the wound base. There is large (67-100%)  red granulation within the wound bed. There is a small (1-33%) amount of necrotic tissue within the wound bed. The periwound skin appearance exhibited: Moist, Mottled. The periwound skin appearance did not exhibit: Callus, Crepitus, Excoriation, Fluctuance, Friable, Induration, Localized Edema, Rash, Scarring, Dry/Scaly, Maceration, Atrophie Blanche, Cyanosis, Ecchymosis, Hemosiderin Staining, Pallor, Rubor, Erythema. Periwound temperature was noted as No Abnormality. The periwound has tenderness on palpation. Wound #2 status is Open. Original cause of wound was Trauma. The wound is located on the Left Forearm. The wound measures 1.5cm length x 1.5cm width x 0.2cm depth; 1.767cm^2 area and 0.353cm^3 volume. The wound is limited to skin breakdown. There is no tunneling or undermining noted. There is a small amount of serosanguineous drainage noted. The wound margin is distinct with the outline attached to the wound base. There is medium (34-66%) pink granulation within the wound bed. There is a medium (34- 66%) amount of necrotic tissue within the wound bed including Adherent Slough. The periwound skin appearance exhibited: Moist. The periwound skin appearance did not exhibit: Callus, Crepitus,  Excoriation, Fluctuance, Friable, Induration, Localized Edema, Rash, Scarring, Dry/Scaly, Maceration, Atrophie Blanche, Cyanosis, Ecchymosis, Hemosiderin Staining, Mottled, Pallor, Rubor, Erythema. Periwound temperature was noted as No Abnormality. The periwound has tenderness on palpation. Wallick, Aloria C. ([id number]) The left arm wound is healing nicely and there is minimal open area. We will cover this with a Mepitel and leave it alone for a week. Assessment Active Problems ICD-10 E11.622 - Type 2 diabetes mellitus with other skin ulcer E11.610 - Type 2 diabetes mellitus with diabetic neuropathic arthropathy I87.331 - Chronic venous hypertension (idiopathic) with ulcer and inflammation of right lower extremity L03.115 - Cellulitis of right lower limb S41.112A - Laceration without foreign body of left upper arm, initial encounter With the usual precautions the Apligraf was applied and bolstered in place with gauze and Steri-Strips. She will have a 2 layer light compression applied. She would come in for a wound check next week. Procedures Wound #1 Wound #1 is a Venous Leg Ulcer located on the Right,Medial Lower Leg. A skin graft procedure using a bioengineered skin substitute/cellular or tissue based product was performed by Mara Favero, Jackson Latino., MD. Apligraf was applied and secured with Steri-Strips. 7 sq cm of product was utilized and 37 sq cm was wasted due to product size. Post Application, MEPITEL was applied. A Time Out was conducted prior to the start of the procedure. The procedure was tolerated well with a pain level of 0 throughout and a pain level of 0 following the procedure. Post procedure Diagnosis Wound #1: Same as Pre-Procedure . Plan Wound Cleansing: Wound #1 Right,Medial Lower Leg: Cleanse wound with mild soap and water Beswick, Nakyia C. ([id number]) Wound #2 Left Forearm: Cleanse wound with mild soap and water Anesthetic: Wound #1 Right,Medial Lower  Leg: Topical Lidocaine 4% cream applied to wound bed prior to debridement Wound #2 Left Forearm: Topical Lidocaine 4% cream applied to wound bed prior to debridement Primary Wound Dressing: Mepitel One Wound #1 Right,Medial Lower Leg: Drawtex Secondary Dressing: Wound #2 Left Forearm: Boardered Foam Dressing Dressing Change Frequency: Wound #1 Right,Medial Lower Leg: Change dressing every week Wound #2 Left Forearm: Change dressing every week Follow-up Appointments: Wound #1 Right,Medial Lower Leg: Return Appointment in 1 week. Wound #2 Left Forearm: Return Appointment in 1 week. Edema Control: Wound #1 Right,Medial Lower Leg: 2 Layer Lite Compression System - Right Lower Extremity Additional Orders / Instructions: Wound #1 Right,Medial Lower Leg: Increase protein intake. Advanced Therapies: Wound #1  Right,Medial Lower Leg: Apligraf application in clinic; including contact layer, fixation with steri strips, dry gauze and cover dressing. With the usual precautions the Apligraf was applied and bolstered in place with gauze and Steri-Strips. She will have a 2 layer light compression applied. The left arm wound is healing nicely and there is minimal open area. We will cover this with a Mepitel and leave it alone for a week. She would come in for a wound check next week. Electronic Signature(s) Signed: 12/26/2014 4:16:44 PM By: Christin Fudge MD, FACS Previous Signature: 12/26/2014 3:15:05 PM Version By: Christin Fudge MD, FACS Previous Signature: 12/26/2014 2:40:04 PM Version By: Christin Fudge MD, FACS ALFREDIA, PETTI ([id number]) Previous Signature: 12/26/2014 2:38:38 PM Version By: Christin Fudge MD, FACS Entered By: Christin Fudge on 12/26/2014 16:16:44 Woolman, [name] -------------------------------------------------------------------------------- SuperBill Details Patient Name: Sampedro, Sayre C. Date of Service: 12/26/2014 Medical Record Number:  [id number] Patient Account Number: [id number] Date of Birth/Sex: [birth year] (81 y.o. Female) Treating RN: Afful, RN, BSN, Velva Harman Primary Care Physician: Ria Bush Other Clinician: Referring Physician: Ria Bush Treating Physician/Extender: Frann Rider in Treatment: 19 Diagnosis Coding ICD-10 Codes Code Description E11.622 Type 2 diabetes mellitus with other skin ulcer E11.610 Type 2 diabetes mellitus with diabetic neuropathic arthropathy Chronic venous hypertension (idiopathic) with ulcer and inflammation of right lower I87.331 extremity L03.115 Cellulitis of right lower limb S41.112A Laceration without foreign body of left upper arm, initial encounter Facility Procedures CPT4: Description Modifier Quantity Code [id number] (Facility Use Only) Apligraf 1 SQ CM 86 CPT4: [id number] - SKIN SUB GRAFT TRNK/ARM/LEG 1 ICD-10 Description Diagnosis E11.622 Type 2 diabetes mellitus with other skin ulcer E11.610 Type 2 diabetes mellitus with diabetic neuropathic arthropathy I87.331 Chronic venous hypertension  (idiopathic) with ulcer and inflammation of right lower extremity L03.115 Cellulitis of right lower limb Physician Procedures CPT4: Description Modifier Quantity Code [id number] - WC PHYS SKIN SUB GRAFT TRNK/ARM/LEG 1 ICD-10 Description Diagnosis E11.622 Type 2 diabetes mellitus with other skin ulcer E11.610 Type 2 diabetes mellitus with diabetic neuropathic arthropathy  I87.331 Chronic venous hypertension (idiopathic) with ulcer and inflammation of right lower extremity L03.115 Cellulitis of right lower limb Carlucci, Timera C. ([id number]) Electronic Signature(s) Signed: 12/26/2014 2:39:12 PM By: Christin Fudge MD, FACS Entered By: Christin Fudge on 12/26/2014 14:39:12

## 2014-12-27 NOTE — Progress Notes (Addendum)
[name], [name] ([id number]) Visit Report for 12/26/2014 Arrival Information Details Patient Name: [name], [name] Date of Service: 12/26/2014 2:00 PM Medical Record Number: [id number] Patient Account Number: [id number] Date of Birth/Sex: 23-Oct-1927 (81 y.o. Female) Treating RN: Afful, RN, BSN, Velva Harman Primary Care Physician: Ria Bush Other Clinician: Referring Physician: Ria Bush Treating Physician/Extender: Frann Rider in Treatment: 5 Visit Information History Since Last Visit Any new allergies or adverse reactions: No Patient Arrived: [name] Had a fall or experienced change in No Arrival Time: 14:08 activities of daily living that may affect Accompanied By: SELF risk of falls: Transfer Assistance: None Signs or symptoms of abuse/neglect since last No Patient Identification Verified: Yes visito Secondary Verification Process Yes Hospitalized since last visit: No Completed: Has Dressing in Place as Prescribed: Yes Patient Has Alerts: Yes Has Compression in Place as Prescribed: Yes Patient Alerts: Patient on Blood Pain Present Now: No Thinner Type II Diabetic 81 MG aspirin Electronic Signature(s) Signed: 12/26/2014 2:10:19 PM By: Regan Lemming BSN, RN Entered By: Regan Lemming on 12/26/2014 14:10:19 Shockley, [name] ([id number]) -------------------------------------------------------------------------------- Encounter Discharge Information Details Patient Name: [name], [name] Date of Service: 12/26/2014 2:00 PM Medical Record Number: [id number] Patient Account Number: [id number] Date of Birth/Sex: [birth year] (81 y.o. Female) Treating RN: Baruch Gouty, RN, BSN, Velva Harman Primary Care Physician: Ria Bush Other Clinician: Referring Physician: Ria Bush Treating Physician/Extender: Frann Rider in Treatment: 63 Encounter Discharge Information Items Discharge Pain Level: 0 Discharge Condition: Stable Ambulatory Status:  Walker Discharge Destination: Home Transportation: Private Auto Accompanied By: son in lobby Schedule Follow-up Appointment: Yes Medication Reconciliation completed Yes and provided to Patient/Care Lizanne Erker: Provided on Clinical Summary of Care: 12/26/2014 Form Type Recipient Paper Patient LL Electronic Signature(s) Signed: 12/26/2014 5:14:03 PM By: Gretta Cool RN, BSN, Kim RN, BSN Previous Signature: 12/26/2014 2:43:18 PM Version By: Ruthine Dose Entered By: Gretta Cool RN, BSN, Kim on 12/26/2014 15:36:26 Babilonia, [name] ([id number]) -------------------------------------------------------------------------------- Lower Extremity Assessment Details Patient Name: [name], [name] Date of Service: 12/26/2014 2:00 PM Medical Record Number: [id number] Patient Account Number: [id number] Date of Birth/Sex: 03-08-1928 (81 y.o. Female) Treating RN: Afful, RN, BSN, Velva Harman Primary Care Physician: Ria Bush Other Clinician: Referring Physician: Ria Bush Treating Physician/Extender: Frann Rider in Treatment: 19 Edema Assessment Assessed: [Left: No] [Right: No] E[Left: dema] [Right: :] Calf Left: Right: Point of Measurement: 27 cm From Medial Instep cm 25.8 cm Ankle Left: Right: Point of Measurement: 10 cm From Medial Instep cm 20.5 cm Vascular Assessment Claudication: Claudication Assessment [Right:None] Pulses: Posterior Tibial Dorsalis Pedis Palpable: [Right:Yes] Extremity colors, hair growth, and conditions: Extremity Color: [Right:Mottled] Hair Growth on Extremity: [Right:No] Temperature of Extremity: [Right:Warm] Capillary Refill: [Right:< 3 seconds] Toe Nail Assessment Left: Right: Thick: Yes Discolored: Yes Deformed: No Improper Length and Hygiene: Yes Electronic Signature(s) Signed: 12/26/2014 2:14:33 PM By: Regan Lemming BSN, RN Entered By: Regan Lemming on 12/26/2014 14:14:33 Baskette, [name] ([id number]) Goldner, Erum Loletha Grayer  ([id number]) -------------------------------------------------------------------------------- Multi Wound Chart Details Patient Name: [name], [name] Date of Service: 12/26/2014 2:00 PM Medical Record Number: [id number] Patient Account Number: [id number] Date of Birth/Sex: July 22, 1927 (81 y.o. Female) Treating RN: Cornell Barman Primary Care Physician: Ria Bush Other Clinician: Referring Physician: Ria Bush Treating Physician/Extender: Frann Rider in Treatment: 19 Vital Signs Height(in): 58 Pulse(bpm): 70 Weight(lbs): 151 Blood Pressure 111/67 (mmHg): Body Mass Index(BMI): 32 Temperature(F): 98.3 Respiratory Rate 18 (breaths/min): Photos: [1:No Photos] [2:No Photos] [N/A:N/A] Wound Location: [1:Right Lower Leg - Medial] [2:Left Forearm] [N/A:N/A] Wounding Event: [1:Gradually Appeared] [2:Trauma] [N/A:N/A] Primary  Etiology: [1:Venous Leg Ulcer] [2:Skin Tear] [N/A:N/A] Comorbid History: [1:Cataracts, Anemia, Coronary Artery Disease, Hypertension, Myocardial Infarction, Type II Diabetes, Osteoarthritis] [2:Cataracts, Anemia, Coronary Artery Disease, Hypertension, Myocardial Infarction, Type II Diabetes, Osteoarthritis]  [N/A:N/A] Date Acquired: [1:04/04/2014] [2:12/10/2014] [N/A:N/A] Weeks of Treatment: [1:19] [2:2] [N/A:N/A] Wound Status: [1:Open] [2:Open] [N/A:N/A] Measurements L x W x D 4.7x1.3x0.2 [2:1.5x1.5x0.2] [N/A:N/A] (cm) Area (cm) : [1:4.799] [2:1.767] [N/A:N/A] Volume (cm) : [1:0.96] [2:0.353] [N/A:N/A] % Reduction in Area: [1:18.50%] [2:37.50%] [N/A:N/A] % Reduction in Volume: 45.70% [2:-24.70%] [N/A:N/A] Classification: [1:Full Thickness Without Exposed Support Structures] [2:Partial Thickness] [N/A:N/A] HBO Classification: [1:Grade 1] [2:N/A] [N/A:N/A] Exudate Amount: [1:Medium] [2:Small] [N/A:N/A] Exudate Type: [1:Serosanguineous] [2:Serosanguineous] [N/A:N/A] Exudate Color: [1:red, brown] [2:red, brown] [N/A:N/A] Wound Margin:  [1:Distinct, outline attached] [2:Distinct, outline attached] [N/A:N/A] Granulation Amount: [1:Large (67-100%)] [2:Medium (34-66%)] [N/A:N/A] Granulation Quality: [1:Red] [2:Pink] [N/A:N/A] Necrotic Amount: [1:Small (1-33%)] [2:Medium (34-66%)] [N/A:N/A] Exposed Structures: [N/A:N/A] Fascia: No Fascia: No Fat: No Fat: No Tendon: No Tendon: No Muscle: No Muscle: No Joint: No Joint: No Bone: No Bone: No Limited to Skin Limited to Skin Breakdown Breakdown Epithelialization: Small (1-33%) None N/A Periwound Skin Texture: Edema: No Edema: No N/A Excoriation: No Excoriation: No Induration: No Induration: No Callus: No Callus: No Crepitus: No Crepitus: No Fluctuance: No Fluctuance: No Friable: No Friable: No Rash: No Rash: No Scarring: No Scarring: No Periwound Skin Moist: Yes Moist: Yes N/A Moisture: Maceration: No Maceration: No Dry/Scaly: No Dry/Scaly: No Periwound Skin Color: Mottled: Yes Atrophie Blanche: No N/A Atrophie Blanche: No Cyanosis: No Cyanosis: No Ecchymosis: No Ecchymosis: No Erythema: No Erythema: No Hemosiderin Staining: No Hemosiderin Staining: No Mottled: No Pallor: No Pallor: No Rubor: No Rubor: No Temperature: No Abnormality No Abnormality N/A Tenderness on Yes Yes N/A Palpation: Wound Preparation: Ulcer Cleansing: Ulcer Cleansing: N/A Rinsed/Irrigated with Rinsed/Irrigated with Saline Saline Topical Anesthetic Topical Anesthetic Applied: Other: lidocaine Applied: Other: lidocaine 4% 4% Treatment Notes Electronic Signature(s) Signed: 12/26/2014 5:14:03 PM By: Gretta Cool, RN, BSN, Kim RN, BSN Entered By: Gretta Cool, RN, BSN, Kim on 12/26/2014 14:26:51 Wohlers, [name] ([id number]) -------------------------------------------------------------------------------- Balm Details Patient Name: Grundman, Raziyah C. Date of Service: 12/26/2014 2:00 PM Medical Record Number: [id number] Patient Account Number:  [id number] Date of Birth/Sex: 03-07-1928 (81 y.o. Female) Treating RN: Cornell Barman Primary Care Physician: Ria Bush Other Clinician: Referring Physician: Ria Bush Treating Physician/Extender: Frann Rider in Treatment: 79 Active Inactive Abuse / Safety / Falls / Self Care Management Nursing Diagnoses: Potential for falls Goals: Patient will remain injury free Date Initiated: 08/15/2014 Goal Status: Active Interventions: Assess fall risk on admission and as needed Notes: Nutrition Nursing Diagnoses: Impaired glucose control: actual or potential Goals: Patient/caregiver verbalizes understanding of need to maintain therapeutic glucose control per primary care physician Date Initiated: 08/15/2014 Goal Status: Active Interventions: Assess patient nutrition upon admission and as needed per policy Notes: Orientation to the Wound Care Program Nursing Diagnoses: Knowledge deficit related to the wound healing center program Goals: Patient/caregiver will verbalize understanding of the [place] TYSHAY, CAPANNA ([id number]) Date Initiated: 08/15/2014 Goal Status: Active Interventions: Provide education on orientation to the wound center Notes: Venous Leg Ulcer Nursing Diagnoses: Actual venous Insuffiency (use after diagnosis is confirmed) Goals: Patient will maintain optimal edema control Date Initiated: 08/15/2014 Goal Status: Active Interventions: Compression as ordered Treatment Activities: Test ordered outside of clinic : 12/26/2014 Notes: Wound/Skin Impairment Nursing Diagnoses: Impaired tissue integrity Goals: Patient/caregiver will verbalize understanding of skin care regimen Date Initiated: 08/15/2014 Goal Status: Active Interventions: Assess patient/caregiver ability to  obtain necessary supplies Notes: Electronic Signature(s) Signed: 12/26/2014 5:14:03 PM By: Gretta Cool, RN, BSN, Kim RN, BSN Entered By: Gretta Cool, RN, BSN, Kim on  12/26/2014 14:26:44 Kempa, [name] ([id number]) -------------------------------------------------------------------------------- Pain Assessment Details Patient Name: Hulse, Holle C. Date of Service: 12/26/2014 2:00 PM Medical Record Number: [id number] Patient Account Number: [id number] Date of Birth/Sex: 1927/06/16 (81 y.o. Female) Treating RN: Baruch Gouty, RN, BSN, Velva Harman Primary Care Physician: Ria Bush Other Clinician: Referring Physician: Ria Bush Treating Physician/Extender: Frann Rider in Treatment: 19 Active Problems Location of Pain Severity and Description of Pain Patient Has [name] No Site Locations Pain Management and Medication Current Pain Management: Electronic Signature(s) Signed: 12/26/2014 2:10:30 PM By: Regan Lemming BSN, RN Entered By: Regan Lemming on 12/26/2014 14:10:29 Goodnough, [name] ([id number]) -------------------------------------------------------------------------------- Patient/Caregiver Education Details Patient Name: Crumpler, Jestine C. Date of Service: 12/26/2014 2:00 PM Medical Record Number: [id number] Patient Account Number: [id number] Date of Birth/Gender: March 16, 1928 (81 y.o. Female) Treating RN: Baruch Gouty, RN, BSN, Velva Harman Primary Care Physician: Ria Bush Other Clinician: Referring Physician: Ria Bush Treating Physician/Extender: Frann Rider in Treatment: 7 Education Assessment Education Provided To: Patient Education Topics Provided Basic Hygiene: Methods: Explain/Verbal Responses: State content correctly Welcome To [facility]: Methods: Explain/Verbal Responses: State content correctly Electronic Signature(s) Signed: 12/26/2014 2:47:06 PM By: Regan Lemming BSN, RN Entered By: Regan Lemming on 12/26/2014 14:47:06 Skousen, [name] ([id number]) -------------------------------------------------------------------------------- Wound Assessment Details Patient Name: Oates, Annaelle C. Date of  Service: 12/26/2014 2:00 PM Medical Record Number: [id number] Patient Account Number: [id number] Date of Birth/Sex: [birth year] (81 y.o. Female) Treating RN: Afful, RN, BSN, Velva Harman Primary Care Physician: Ria Bush Other Clinician: Referring Physician: Ria Bush Treating Physician/Extender: Frann Rider in Treatment: 19 Wound Status Wound Number: 1 Primary Venous Leg Ulcer Etiology: Wound Location: Right Lower Leg - Medial Wound Open Wounding Event: Gradually Appeared Status: Date Acquired: 04/04/2014 Comorbid Cataracts, Anemia, Coronary Artery Weeks Of Treatment: 19 History: Disease, Hypertension, Myocardial Clustered Wound: No Infarction, Type II Diabetes, Osteoarthritis Photos Photo Uploaded By: Regan Lemming on 12/26/2014 16:17:24 Wound Measurements Length: (cm) 4.7 Width: (cm) 1.3 Depth: (cm) 0.2 Area: (cm) 4.799 Volume: (cm) 0.96 % Reduction in Area: 18.5% % Reduction in Volume: 45.7% Epithelialization: Small (1-33%) Tunneling: No Undermining: No Wound Description Full Thickness Without Exposed Foul Odor A Classification: Support Structures Diabetic Severity Grade 1 (Wagner): Wound Margin: Distinct, outline attached Exudate Amount: Medium Exudate Type: Serosanguineous Exudate Color: red, brown fter Cleansing: No Wound Bed Baynes, Kapri C. ([id number]) Granulation Amount: Large (67-100%) Exposed Structure Granulation Quality: Red Fascia Exposed: No Necrotic Amount: Small (1-33%) Fat Layer Exposed: No Tendon Exposed: No Muscle Exposed: No Joint Exposed: No Bone Exposed: No Limited to Skin Breakdown Periwound Skin Texture Texture Color No Abnormalities Noted: No No Abnormalities Noted: No Callus: No Atrophie Blanche: No Crepitus: No Cyanosis: No Excoriation: No Ecchymosis: No Fluctuance: No Erythema: No Friable: No Hemosiderin Staining: No Induration: No Mottled: Yes Localized Edema: No Pallor: No Rash: No Rubor:  No Scarring: No Temperature / Pain Moisture Temperature: No Abnormality No Abnormalities Noted: No Tenderness on Palpation: Yes Dry / Scaly: No Maceration: No Moist: Yes Wound Preparation Ulcer Cleansing: Rinsed/Irrigated with Saline Topical Anesthetic Applied: Other: lidocaine 4%, Treatment Notes Wound #1 (Right, Medial Lower Leg) 1. Cleansed with: Cleanse wound with antibacterial soap and water 2. Anesthetic Topical Lidocaine 4% cream to wound bed prior to debridement 4. Dressing Applied: Mepitel 5. Secondary Dressing Applied Bordered Foam Dressing 7. Secured with 2 Layer Lite Compression System - Right Lower  Extremity Notes Drawtex, abd to right leg. Mepitel and BFD to left forearm Masini, AMENIA DEVAULT ([id number]) Electronic Signature(s) Signed: 12/26/2014 2:20:24 PM By: Regan Lemming BSN, RN Entered By: Regan Lemming on 12/26/2014 14:20:24 Bogosian, [name] ([id number]) -------------------------------------------------------------------------------- Wound Assessment Details Patient Name: Kuehnel, Jame C. Date of Service: 12/26/2014 2:00 PM Medical Record Number: [id number] Patient Account Number: [id number] Date of Birth/Sex: 1927-06-14 (81 y.o. Female) Treating RN: Afful, RN, BSN, Velva Harman Primary Care Physician: Ria Bush Other Clinician: Referring Physician: Ria Bush Treating Physician/Extender: Frann Rider in Treatment: 19 Wound Status Wound Number: 2 Primary Skin Tear Etiology: Wound Location: Left Forearm Wound Open Wounding Event: Trauma Status: Date Acquired: 12/10/2014 Comorbid Cataracts, Anemia, Coronary Artery Weeks Of Treatment: 2 History: Disease, Hypertension, Myocardial Clustered Wound: No Infarction, Type II Diabetes, Osteoarthritis Photos Photo Uploaded By: Regan Lemming on 12/26/2014 16:17:39 Wound Measurements Length: (cm) 1.5 Width: (cm) 1.5 Depth: (cm) 0.2 Area: (cm) 1.767 Volume: (cm) 0.353 % Reduction in Area:  37.5% % Reduction in Volume: -24.7% Epithelialization: None Tunneling: No Undermining: No Wound Description Classification: Partial Thickness Wound Margin: Distinct, outline attached Exudate Amount: Small Exudate Type: Serosanguineous Exudate Color: red, brown Foul Odor After Cleansing: No Wound Bed Granulation Amount: Medium (34-66%) Exposed Structure Granulation Quality: Pink Fascia Exposed: No Necrotic Amount: Medium (34-66%) Fat Layer Exposed: No Klapper, Xuan C. ([id number]) Necrotic Quality: Adherent Slough Tendon Exposed: No Muscle Exposed: No Joint Exposed: No Bone Exposed: No Limited to Skin Breakdown Periwound Skin Texture Texture Color No Abnormalities Noted: No No Abnormalities Noted: No Callus: No Atrophie Blanche: No Crepitus: No Cyanosis: No Excoriation: No Ecchymosis: No Fluctuance: No Erythema: No Friable: No Hemosiderin Staining: No Induration: No Mottled: No Localized Edema: No Pallor: No Rash: No Rubor: No Scarring: No Temperature / Pain Moisture Temperature: No Abnormality No Abnormalities Noted: No Tenderness on Palpation: Yes Dry / Scaly: No Maceration: No Moist: Yes Wound Preparation Ulcer Cleansing: Rinsed/Irrigated with Saline Topical Anesthetic Applied: Other: lidocaine 4%, Treatment Notes Wound #2 (Left Forearm) 1. Cleansed with: Cleanse wound with antibacterial soap and water 2. Anesthetic Topical Lidocaine 4% cream to wound bed prior to debridement 4. Dressing Applied: Mepitel 5. Secondary Dressing Applied Bordered Foam Dressing 7. Secured with 2 Layer Lite Compression System - Right Lower Extremity Notes Drawtex, abd to right leg. Mepitel and BFD to left forearm Electronic Signature(s) Signed: 12/26/2014 2:21:03 PM By: Regan Lemming BSN, RN Entered By: Regan Lemming on 12/26/2014 14:21:03 Layson, [name] ([id number]) Ging, Danaija Loletha Grayer  ([id number]) -------------------------------------------------------------------------------- Vitals Details Patient Name: Hannis, Gladys C. Date of Service: 12/26/2014 2:00 PM Medical Record Number: [id number] Patient Account Number: [id number] Date of Birth/Sex: 08/22/1927 (81 y.o. Female) Treating RN: Afful, RN, BSN, Velva Harman Primary Care Physician: Ria Bush Other Clinician: Referring Physician: Ria Bush Treating Physician/Extender: Frann Rider in Treatment: 19 Vital Signs Time Taken: 14:10 Temperature (F): 98.3 Height (in): 58 Pulse (bpm): 70 Weight (lbs): 151 Respiratory Rate (breaths/min): 18 Body Mass Index (BMI): 31.6 Blood Pressure (mmHg): 111/67 Reference Range: 80 - 120 mg / dl Electronic Signature(s) Signed: 12/26/2014 2:10:55 PM By: Regan Lemming BSN, RN Entered By: Regan Lemming on 12/26/2014 14:10:55

## 2014-12-28 ENCOUNTER — Other Ambulatory Visit: Payer: Self-pay | Admitting: Family Medicine

## 2014-12-30 ENCOUNTER — Other Ambulatory Visit: Payer: Self-pay | Admitting: Family Medicine

## 2015-01-01 ENCOUNTER — Other Ambulatory Visit: Payer: Self-pay

## 2015-01-01 MED ORDER — HYDROCODONE-ACETAMINOPHEN 5-325 MG PO TABS: 0.5000 | ORAL_TABLET | Freq: Four times a day (QID) | ORAL | Status: DC | PRN

## 2015-01-01 NOTE — Telephone Encounter (Signed)
Printed and in Kim's box 

## 2015-01-01 NOTE — Telephone Encounter (Signed)
Patient notified and Rx placed up front for pick up. 

## 2015-01-01 NOTE — Telephone Encounter (Signed)
Pt left v/m requesting rx hydrocodone apap. Call when ready for pick up. rx last printed # 60 on 12/03/14; last F/U appt 09/02/14. Pt request to pick up rx on 01/02/15.

## 2015-01-02 ENCOUNTER — Encounter: Payer: Medicare Other | Admitting: Surgery

## 2015-01-02 DIAGNOSIS — E11622 Type 2 diabetes mellitus with other skin ulcer: Secondary | ICD-10-CM | POA: Diagnosis not present

## 2015-01-02 DIAGNOSIS — I10 Essential (primary) hypertension: Secondary | ICD-10-CM | POA: Diagnosis not present

## 2015-01-02 DIAGNOSIS — I87331 Chronic venous hypertension (idiopathic) with ulcer and inflammation of right lower extremity: Secondary | ICD-10-CM | POA: Diagnosis not present

## 2015-01-02 DIAGNOSIS — E11610 Type 2 diabetes mellitus with diabetic neuropathic arthropathy: Secondary | ICD-10-CM | POA: Diagnosis not present

## 2015-01-02 DIAGNOSIS — L03115 Cellulitis of right lower limb: Secondary | ICD-10-CM | POA: Diagnosis not present

## 2015-01-02 DIAGNOSIS — L97211 Non-pressure chronic ulcer of right calf limited to breakdown of skin: Secondary | ICD-10-CM | POA: Diagnosis not present

## 2015-01-02 DIAGNOSIS — L97811 Non-pressure chronic ulcer of other part of right lower leg limited to breakdown of skin: Secondary | ICD-10-CM | POA: Diagnosis not present

## 2015-01-03 NOTE — Progress Notes (Signed)
[name] ([id number]) Visit Report for 01/02/2015 Chief Complaint Document Details Patient Name: [name], [name] Date of Service: 01/02/2015 2:00 PM Medical Record Number: [id number] Patient Account Number: [id number] Date of Birth/Sex: [birth year] (81 y.o. Female) Treating RN: Baruch Gouty, RN, BSN, Velva Harman Primary Care Physician: Ria Bush Other Clinician: Referring Physician: Ria Bush Treating Physician/Extender: Frann Rider in Treatment: 20 Information Obtained from: Patient Chief Complaint Patient presents for treatment of an open ulcer due to venous insufficiency. 81 year old patient who is known to me from the [facility] wound care center now comes in follow up here for a chronic venous ulceration of her right lower extremity which she's had for about 6 months. Electronic Signature(s) Signed: 01/02/2015 2:57:21 PM By: Christin Fudge MD, FACS Entered By: Christin Fudge on 01/02/2015 14:57:20 [name], [name] ([id number]) -------------------------------------------------------------------------------- HPI Details Patient Name: [name], [name] Date of Service: 01/02/2015 2:00 PM Medical Record Number: [id number] Patient Account Number: [id number] Date of Birth/Sex: 15-Mar-1928 (81 y.o. Female) Treating RN: Afful, RN, BSN, Velva Harman Primary Care Physician: Ria Bush Other Clinician: Referring Physician: Ria Bush Treating Physician/Extender: Frann Rider in Treatment: 20 History of Present Illness Location: ulcer in the right lower extremity for several months Quality: Patient reports experiencing a dull pain to affected area(s). Severity: Patient states wound (s) are getting better. Duration: Patient has had the wound for > 3 months prior to seeking treatment at the wound center Timing: Pain in wound is Intermittent (comes and goes Context: The wound appeared gradually over time Modifying Factors: Consults to this date include: vascular  surgery for the veins in December 2015 and she's been seeing the wound care center at [facility] for several months. Associated Signs and Symptoms: Patient reports having difficulty standing for long periods. HPI Description: This is an 82 yrs old female we have been following since the beginning of January for a wound on the right medial ankle. She had previously had venous ablation of the greater saphenous vein by Dr. Kellie Simmering. She came to [place] with erythema of the right leg and subsequently is opened in the medial aspect of the right ankle. She has not been very compliant with compression. We ordered her at juxta lite stocking, however she apparently has not been able to wear it. The area on the medial aspect of her left ankle has much less induration and erythema. The edema is better controlled. No evidence of infection is noted. 07/18/14; no major change in the condition of this wound. There is still surface slough and probably some drainage. 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 All other veins showed no evidence of DVT, superficial venous thrombosis or incompetence. however she does have manifestations of chronic venous hypertension and I believe we will change her dressing over to Prisma and a Profore light dressing. she sees her primary care doctor regularly and her last hemoglobin A1c has been around 7%.her other comorbidities are hypertension, CABG in 1998, peptic ulcer disease, history of echo with a ejection fraction of 55-60% and grade 1 diastolic dysfunction. past surgical history include coronary artery bypass graft in 1998, cholecystectomy, appendectomy, hand surgery, endovenous ablation of the saphenous vein with laser by Dr. Kellie Simmering in December 2015. 08/29/2014 - she complains of increased calf pain over the past week. removed her compression bandage and pain  improved. no fever or chills. Mild clear drainage. 09/04/2014 -- she has  not been compliant and has not done a dressing change as we had ordered last week. She always complains and does not keep her wrap on and this past week she has done the same. She has completed a course of doxycycline. 09/25/2014 -- she has tolerated her dressing all week and has not removed it. She says it's hurting a lot today and would not like any debridement. We will do so as per her wishes. she did get a appointment with the vascular surgeon and sees him on 6/28. [name] ([id number]) 10/03/2014 - She was seen by Dr. Tinnie Gens 09/30/2014. Assessment: I reviewed the previous venous duplex exam study performed in September 2015 which reveals no options for treating this venous ulcer. There is no significant superficial venous reflux to treat with laser ablation.Most recent ABI was 0.91 in the right foot with palpable dorsalis pedis pulse at 3+ Plan: patient needs to return to the [place] wound center and be treated by Dr. Con Memos. No arterial or venous options available from vascular standpoint or treatment of right leg stasis ulcer.Continued dressing changes and compression is the treatment of choice 10/16/2014 -- today will be her first application of Apligraf. 10/23/2014 -- we are going to be able to apply her second day of Apligraf next week. She is complaining of some tenderness in the region of Achilles tendon and this is possibly due to her wrap. 11/06/2014 -- she did not have Apligraf last week because the wound was not looking good but she says she is feeling much better now. 11/13/2014 -- she has been doing well this week and she is here for her second Apligraf. 11/27/2014 -- she is here for her third application of Apligraf 12/12/2014 -- she is here for her fourth application of Apligraf. Also during these past 3 days she had an injury to her left forearm which she caught against the edge of  her desk and has a lacerated wound which has gone down to the subcutaneous tissue. 12/26/2014 -- she is here for a last/fifth application of Apligraf. Electronic Signature(s) Signed: 01/02/2015 2:57:30 PM By: Christin Fudge MD, FACS Entered By: Christin Fudge on 01/02/2015 14:57:30 Haese, [name] ([id number]) -------------------------------------------------------------------------------- Physical Exam Details Patient Name: Tarazon, Arretta C. Date of Service: 01/02/2015 2:00 PM Medical Record Number: [id number] Patient Account Number: [id number] Date of Birth/Sex: 27-Jul-1927 (81 y.o. Female) Treating RN: Baruch Gouty, RN, BSN, Velva Harman Primary Care Physician: Ria Bush Other Clinician: Referring Physician: Ria Bush Treating Physician/Extender: Frann Rider in Treatment: 20 Constitutional . Pulse regular. Respirations normal and unlabored. Afebrile. . Eyes Nonicteric. Reactive to light. Ears, Nose, Mouth, and Throat Lips, teeth, and gums WNL.Marland Kitchen Moist mucosa without lesions . Neck supple and nontender. No palpable supraclavicular or cervical adenopathy. Normal sized without goiter. Respiratory WNL. No retractions.. Cardiovascular Pedal Pulses WNL. No clubbing, cyanosis or edema. Chest Breasts symmetical and no nipple discharge.. Breast tissue WNL, no masses, lumps, or tenderness.. Lymphatic No adneopathy. No adenopathy. No adenopathy. Musculoskeletal Adexa without tenderness or enlargement.. Digits and nails w/o clubbing, cyanosis, infection, petechiae, ischemia, or inflammatory conditions.. Integumentary (Hair, Skin) No suspicious lesions. No crepitus or fluctuance. No peri-wound warmth or erythema. No masses.Marland Kitchen Psychiatric Judgement and insight Intact.. No evidence of depression, anxiety, or agitation.. Notes The dressing on the right lower extremity was changed and we will wash it with saline and remove all the exudate. The left arm looks very good is  epithelialized. Electronic Signature(s) Signed: 01/02/2015 2:58:24 PM By: Christin Fudge MD, FACS Entered  By: Christin Fudge on 01/02/2015 14:58:23 Disla, [name] ([id number]) -------------------------------------------------------------------------------- Physician Orders Details Patient Name: Benbrook, Norely C. Date of Service: 01/02/2015 2:00 PM Medical Record Number: [id number] Patient Account Number: [id number] Date of Birth/Sex: 08-Mar-1928 (81 y.o. Female) Treating RN: Cornell Barman Primary Care Physician: Ria Bush Other Clinician: Referring Physician: Ria Bush Treating Physician/Extender: Frann Rider in Treatment: 20 Verbal / Phone Orders: Yes Clinician: Cornell Barman Read Back and Verified: Yes Diagnosis Coding Wound Cleansing Wound #1 Right,Medial Lower Leg o Cleanse wound with mild soap and water Wound #2 Left Forearm o Cleanse wound with mild soap and water Primary Wound Dressing Wound #1 Right,Medial Lower Leg o Promogran Wound #2 Left Forearm o Mepitel One Secondary Dressing Wound #2 Left Forearm o Boardered Foam Dressing Wound #1 Right,Medial Lower Leg o Foam - Telle Max Dressing Change Frequency Wound #1 Right,Medial Lower Leg o Change dressing every week Wound #2 Left Forearm o Change dressing every week Follow-up Appointments Wound #1 Right,Medial Lower Leg o Return Appointment in 1 week. Wound #2 Left Forearm o Return Appointment in 1 week. GALINA, OREA ([id number]) Electronic Signature(s) Signed: 01/02/2015 4:17:16 PM By: Christin Fudge MD, FACS Signed: 01/02/2015 4:53:29 PM By: Gretta Cool RN, BSN, Kim RN, BSN Entered By: Gretta Cool, RN, BSN, Kim on 01/02/2015 14:51:13 Corl, [name] ([id number]) -------------------------------------------------------------------------------- Problem List Details Patient Name: Lantry, Emali C. Date of Service: 01/02/2015 2:00 PM Medical Record Number: [id number] Patient  Account Number: [id number] Date of Birth/Sex: 02-09-1928 (81 y.o. Female) Treating RN: Baruch Gouty, RN, BSN, Velva Harman Primary Care Physician: Ria Bush Other Clinician: Referring Physician: Ria Bush Treating Physician/Extender: Frann Rider in Treatment: 20 Active Problems ICD-10 Encounter Code Description Active Date Diagnosis E11.622 Type 2 diabetes mellitus with other skin ulcer 08/15/2014 Yes E11.610 Type 2 diabetes mellitus with diabetic neuropathic 08/15/2014 Yes arthropathy I87.331 Chronic venous hypertension (idiopathic) with ulcer and 08/15/2014 Yes inflammation of right lower extremity L03.115 Cellulitis of right lower limb 08/29/2014 Yes S41.112A Laceration without foreign body of left upper arm, initial 12/12/2014 Yes encounter Inactive Problems Resolved Problems Electronic Signature(s) Signed: 01/02/2015 2:57:09 PM By: Christin Fudge MD, FACS Entered By: Christin Fudge on 01/02/2015 14:57:08 Fulco, [name] ([id number]) -------------------------------------------------------------------------------- Progress Note Details Patient Name: Anzaldo, Bobbi C. Date of Service: 01/02/2015 2:00 PM Medical Record Number: [id number] Patient Account Number: [id number] Date of Birth/Sex: [birth year] (81 y.o. Female) Treating RN: Baruch Gouty, RN, BSN, Velva Harman Primary Care Physician: Ria Bush Other Clinician: Referring Physician: Ria Bush Treating Physician/Extender: Frann Rider in Treatment: 20 Subjective Chief Complaint Information obtained from Patient Patient presents for treatment of an open ulcer due to venous insufficiency. 81 year old patient who is known to me from the [facility] wound care center now comes in follow up here for a chronic venous ulceration of her right lower extremity which she's had for about 6 months. History of Present Illness (HPI) The following HPI elements were documented for the patient's wound: Location: ulcer in the right  lower extremity for several months Quality: Patient reports experiencing a dull pain to affected area(s). Severity: Patient states wound (s) are getting better. Duration: Patient has had the wound for > 3 months prior to seeking treatment at the wound center Timing: Pain in wound is Intermittent (comes and goes Context: The wound appeared gradually over time Modifying Factors: Consults to this date include: vascular surgery for the veins in December 2015 and she's been seeing the wound care center at [facility] for several months. Associated Signs and Symptoms: Patient reports having difficulty standing  for long periods. This is an 81 yrs old female we have been following since the beginning of January for a wound on the right medial ankle. She had previously had venous ablation of the greater saphenous vein by Dr. Kellie Simmering. She came to [place] with erythema of the right leg and subsequently is opened in the medial aspect of the right ankle. She has not been very compliant with compression. We ordered her at juxta lite stocking, however she apparently has not been able to wear it. The area on the medial aspect of her left ankle has much less induration and erythema. The edema is better controlled. No evidence of infection is noted. 07/18/14; no major change in the condition of this wound. There is still surface slough and probably some drainage. 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 All other veins showed no evidence of DVT, superficial venous thrombosis or incompetence. however she does have manifestations of chronic venous hypertension and I believe we will change her dressing over to Prisma and a Profore light dressing. she sees her primary care doctor regularly and her last hemoglobin A1c has been around 7%.her other comorbidities are hypertension, CABG in 1998, peptic  ulcer disease, history of echo with a ejection fraction of 55-60% and grade 1 diastolic dysfunction. past surgical history include coronary artery bypass graft in 1998, cholecystectomy, appendectomy, hand surgery, endovenous ablation of the saphenous vein with laser by Dr. Kellie Simmering in December 2015. AVY, SIMONYAN ([id number]) 08/29/2014 - she complains of increased calf pain over the past week. removed her compression bandage and pain improved. no fever or chills. Mild clear drainage. 09/04/2014 -- she has not been compliant and has not done a dressing change as we had ordered last week. She always complains and does not keep her wrap on and this past week she has done the same. She has completed a course of doxycycline. 09/25/2014 -- she has tolerated her dressing all week and has not removed it. She says it's hurting a lot today and would not like any debridement. We will do so as per her wishes. she did get a appointment with the vascular surgeon and sees him on 6/28. 10/03/2014 - She was seen by Dr. Tinnie Gens 09/30/2014. Assessment: I reviewed the previous venous duplex exam study performed in September 2015 which reveals no options for treating this venous ulcer. There is no significant superficial venous reflux to treat with laser ablation.Most recent ABI was 0.91 in the right foot with palpable dorsalis pedis pulse at 3+ Plan: patient needs to return to the [place] wound center and be treated by Dr. Con Memos. No arterial or venous options available from vascular standpoint or treatment of right leg stasis ulcer.Continued dressing changes and compression is the treatment of choice 10/16/2014 -- today will be her first application of Apligraf. 10/23/2014 -- we are going to be able to apply her second day of Apligraf next week. She is complaining of some tenderness in the region of Achilles tendon and this is possibly due to her wrap. 11/06/2014 -- she did not have Apligraf last week  because the wound was not looking good but she says she is feeling much better now. 11/13/2014 -- she has been doing well this week and she is here for her second Apligraf. 11/27/2014 -- she is here for her third application of Apligraf 12/12/2014 -- she  is here for her fourth application of Apligraf. Also during these past 3 days she had an injury to her left forearm which she caught against the edge of her desk and has a lacerated wound which has gone down to the subcutaneous tissue. 12/26/2014 -- she is here for a last/fifth application of Apligraf. Objective Constitutional Pulse regular. Respirations normal and unlabored. Afebrile. Vitals Time Taken: 2:13 PM, Height: 58 in, Weight: 151 lbs, BMI: 31.6, Temperature: 98.1 F, Pulse: 74 bpm, Respiratory Rate: 18 breaths/min, Blood Pressure: 136/47 mmHg. Eyes Nonicteric. Reactive to light. Ears, Nose, Mouth, and Throat Guild, Vasilia C. ([id number]) Lips, teeth, and gums WNL.Marland Kitchen Moist mucosa without lesions . Neck supple and nontender. No palpable supraclavicular or cervical adenopathy. Normal sized without goiter. Respiratory WNL. No retractions.. Cardiovascular Pedal Pulses WNL. No clubbing, cyanosis or edema. Chest Breasts symmetical and no nipple discharge.. Breast tissue WNL, no masses, lumps, or tenderness.. Lymphatic No adneopathy. No adenopathy. No adenopathy. Musculoskeletal Adexa without tenderness or enlargement.. Digits and nails w/o clubbing, cyanosis, infection, petechiae, ischemia, or inflammatory conditions.Marland Kitchen Psychiatric Judgement and insight Intact.. No evidence of depression, anxiety, or agitation.. General Notes: The dressing on the right lower extremity was changed and we will wash it with saline and remove all the exudate. The left arm looks very good is epithelialized. Integumentary (Hair, Skin) No suspicious lesions. No crepitus or fluctuance. No peri-wound warmth or erythema. No masses.. Wound #1 status is  Open. Original cause of wound was Gradually Appeared. The wound is located on the Right,Medial Lower Leg. The wound measures 4cm length x 1cm width x 0.2cm depth; 3.142cm^2 area and 0.628cm^3 volume. The wound is limited to skin breakdown. There is a medium amount of serosanguineous drainage noted. The wound margin is distinct with the outline attached to the wound base. There is large (67-100%) red granulation within the wound bed. There is a small (1-33%) amount of necrotic tissue within the wound bed. The periwound skin appearance exhibited: Moist, Mottled. The periwound skin appearance did not exhibit: Callus, Crepitus, Excoriation, Fluctuance, Friable, Induration, Localized Edema, Rash, Scarring, Dry/Scaly, Maceration, Atrophie Blanche, Cyanosis, Ecchymosis, Hemosiderin Staining, Pallor, Rubor, Erythema. Periwound temperature was noted as No Abnormality. The periwound has tenderness on palpation. General Notes: Apligraf applied last week; measurements from previous week used today/ Wound #2 status is Open. Original cause of wound was Trauma. The wound is located on the Left Forearm. The wound measures 0.7cm length x 0.6cm width x 0.1cm depth; 0.33cm^2 area and 0.033cm^3 volume. The wound is limited to skin breakdown. There is a small amount of serosanguineous drainage noted. The wound margin is distinct with the outline attached to the wound base. There is large (67-100%) pink granulation within the wound bed. There is no necrotic tissue within the wound bed. The periwound skin appearance exhibited: Moist. The periwound skin appearance did not exhibit: Callus, Crepitus, Excoriation, Fluctuance, Friable, Induration, Localized Edema, Rash, Scarring, Dry/Scaly, Maceration, Atrophie Blanche, Cyanosis, Ecchymosis, Hemosiderin Staining, Mottled, Pallor, Rubor, Erythema. Periwound temperature Clyne, Harleen C. ([id number]) was noted as No Abnormality. The periwound has tenderness on  palpation. Assessment Active Problems ICD-10 E11.622 - Type 2 diabetes mellitus with other skin ulcer E11.610 - Type 2 diabetes mellitus with diabetic neuropathic arthropathy I87.331 - Chronic venous hypertension (idiopathic) with ulcer and inflammation of right lower extremity L03.115 - Cellulitis of right lower limb S41.112A - Laceration without foreign body of left upper arm, initial encounter Plan Wound Cleansing: Wound #1 Right,Medial Lower Leg: Cleanse wound with mild soap and water  Wound #2 Left Forearm: Cleanse wound with mild soap and water Primary Wound Dressing: Wound #1 Right,Medial Lower Leg: Promogran Wound #2 Left Forearm: Mepitel One Secondary Dressing: Wound #2 Left Forearm: Boardered Foam Dressing Wound #1 Right,Medial Lower Leg: Foam - Telle Max Dressing Change Frequency: Wound #1 Right,Medial Lower Leg: Change dressing every week Wound #2 Left Forearm: Change dressing every week Follow-up Appointments: Wound #1 Right,Medial Lower Leg: Return Appointment in 1 week. Wound #2 Left Forearm: Return Appointment in 1 week. Plain, Miyoko C. ([id number]) I have recommended collagen on the right lower extremity and continue a 2 layer light compression. On the left forearm they will apply some Mepitel and a border foam. This can be left on for a week Electronic Signature(s) Signed: 01/02/2015 2:58:59 PM By: Christin Fudge MD, FACS Entered By: Christin Fudge on 01/02/2015 14:58:59 Dassow, [name] ([id number]) -------------------------------------------------------------------------------- SuperBill Details Patient Name: Cryer, Rekia C. Date of Service: 01/02/2015 Medical Record Number: [id number] Patient Account Number: [id number] Date of Birth/Sex: [birth year] (81 y.o. Female) Treating RN: Cornell Barman Primary Care Physician: Ria Bush Other Clinician: Referring Physician: Ria Bush Treating Physician/Extender: Frann Rider in  Treatment: 20 Diagnosis Coding ICD-10 Codes Code Description E11.622 Type 2 diabetes mellitus with other skin ulcer E11.610 Type 2 diabetes mellitus with diabetic neuropathic arthropathy Chronic venous hypertension (idiopathic) with ulcer and inflammation of right lower I87.331 extremity L03.115 Cellulitis of right lower limb S41.112A Laceration without foreign body of left upper arm, initial encounter Facility Procedures CPT4: Description Modifier Quantity Code [id number] (Facility Use Only) [phone] - [place] [id number] LWR RT 1 LEG Physician Procedures CPT4: Description Modifier Quantity Code [id number] - WC PHYS LEVEL 3 - EST PT 1 ICD-10 Description Diagnosis E11.622 Type 2 diabetes mellitus with other skin ulcer E11.610 Type 2 diabetes mellitus with diabetic neuropathic arthropathy I87.331 Chronic  venous hypertension (idiopathic) with ulcer and inflammation of right lower extremity L03.115 Cellulitis of right lower limb Electronic Signature(s) Signed: 01/02/2015 2:59:17 PM By: Christin Fudge MD, FACS Entered By: Christin Fudge on 01/02/2015 14:59:17

## 2015-01-03 NOTE — Progress Notes (Signed)
CHANDY, VANSCOYK ([id number]) Visit Report for 01/02/2015 Arrival Information Details Patient Name: Linares, Palmira C. Date of Service: 01/02/2015 2:00 PM Medical Record Number: [id number] Patient Account Number: [id number] Date of Birth/Sex: [birth year] (81 y.o. Female) Treating RN: Cornell Barman Primary Care Physician: Ria Bush Other Clinician: Referring Physician: Ria Bush Treating Physician/Extender: Frann Rider in Treatment: 32 Visit Information History Since Last Visit Added or deleted any medications: No Patient Arrived: Walker Any new allergies or adverse reactions: No Arrival Time: 14:12 Had a fall or experienced change in No Accompanied By: self activities of daily living that may affect Transfer Assistance: None risk of falls: Patient Identification Verified: Yes Signs or symptoms of abuse/neglect since last No Secondary Verification Process Yes visito Completed: Has Dressing in Place as Prescribed: Yes Patient Has Alerts: Yes Has Compression in Place as Prescribed: Yes Patient Alerts: Patient on Blood Pain Present Now: Yes Thinner Type II Diabetic 81 MG aspirin Electronic Signature(s) Signed: 01/02/2015 4:53:29 PM By: Gretta Cool, RN, BSN, Kim RN, BSN Entered By: Gretta Cool, RN, BSN, Kim on 01/02/2015 14:12:40 Petit, [name] ([id number]) -------------------------------------------------------------------------------- Encounter Discharge Information Details Patient Name: Yanda, Charyl C. Date of Service: 01/02/2015 2:00 PM Medical Record Number: [id number] Patient Account Number: [id number] Date of Birth/Sex: July 23, 1927 (81 y.o. Female) Treating RN: Baruch Gouty, RN, BSN, Velva Harman Primary Care Physician: Ria Bush Other Clinician: Referring Physician: Ria Bush Treating Physician/Extender: Frann Rider in Treatment: 20 Encounter Discharge Information Items Discharge Pain Level: 0 Discharge Condition: Stable Ambulatory Status:  Walker Discharge Destination: Home Transportation: Private Auto Accompanied By: son Schedule Follow-up Appointment: Yes Medication Reconciliation completed Yes and provided to Patient/Care Provider: Provided on Clinical Summary of Care: 01/02/2015 Form Type Recipient Paper Patient LL Electronic Signature(s) Signed: 01/02/2015 4:53:29 PM By: Gretta Cool RN, BSN, Kim RN, BSN Previous Signature: 01/02/2015 2:49:38 PM Version By: Ruthine Dose Entered By: Gretta Cool RN, BSN, Kim on 01/02/2015 14:55:11 Gwyn, [name] ([id number]) -------------------------------------------------------------------------------- Lower Extremity Assessment Details Patient Name: Zwack, Matison C. Date of Service: 01/02/2015 2:00 PM Medical Record Number: [id number] Patient Account Number: [id number] Date of Birth/Sex: 04-May-1927 (81 y.o. Female) Treating RN: Cornell Barman Primary Care Physician: Ria Bush Other Clinician: Referring Physician: Ria Bush Treating Physician/Extender: Frann Rider in Treatment: 20 Edema Assessment Assessed: [Left: No] [Right: No] E[Left: dema] [Right: :] Calf Left: Right: Point of Measurement: 27 cm From Medial Instep cm 26.5 cm Ankle Left: Right: Point of Measurement: 10 cm From Medial Instep cm 21.3 cm Vascular Assessment Pulses: Posterior Tibial Dorsalis Pedis Palpable: [Right:Yes] Extremity colors, hair growth, and conditions: Extremity Color: [Right:Mottled] Hair Growth on Extremity: [Right:No] Temperature of Extremity: [Right:Warm] Capillary Refill: [Right:< 3 seconds] Toe Nail Assessment Left: Right: Thick: No Discolored: No Deformed: No Improper Length and Hygiene: No Electronic Signature(s) Signed: 01/02/2015 4:53:29 PM By: Gretta Cool, RN, BSN, Kim RN, BSN Entered By: Gretta Cool, RN, BSN, Kim on 01/02/2015 14:19:42 Delong, [name] ([id number]) -------------------------------------------------------------------------------- Multi Wound Chart  Details Patient Name: Dilmore, Claudean C. Date of Service: 01/02/2015 2:00 PM Medical Record Number: [id number] Patient Account Number: [id number] Date of Birth/Sex: 11-27-1927 (81 y.o. Female) Treating RN: Cornell Barman Primary Care Physician: Ria Bush Other Clinician: Referring Physician: Ria Bush Treating Physician/Extender: Frann Rider in Treatment: 20 Vital Signs Height(in): 58 Pulse(bpm): 74 Weight(lbs): 151 Blood Pressure 136/47 (mmHg): Body Mass Index(BMI): 32 Temperature(F): 98.1 Respiratory Rate 18 (breaths/min): Photos: [1:No Photos] [2:No Photos] [N/A:N/A] Wound Location: [1:Right, Medial Lower Leg] [2:Left Forearm] [N/A:N/A] Wounding Event: [1:Gradually Appeared] [2:Trauma] [N/A:N/A] Primary Etiology: [1:Venous Leg Ulcer] [2:Skin Tear] [  N/A:N/A] Comorbid History: [1:Cataracts, Anemia, Coronary Artery Disease, Hypertension, Myocardial Infarction, Type II Diabetes, Osteoarthritis] [2:Cataracts, Anemia, Coronary Artery Disease, Hypertension, Myocardial Infarction, Type II Diabetes, Osteoarthritis]  [N/A:N/A] Date Acquired: [1:04/04/2014] [2:12/10/2014] [N/A:N/A] Weeks of Treatment: [1:20] [2:3] [N/A:N/A] Wound Status: [1:Open] [2:Open] [N/A:N/A] Measurements L x W x D 4x1x0.2 [2:0.7x0.6x0.1] [N/A:N/A] (cm) Area (cm) : [1:3.142] [2:0.33] [N/A:N/A] Volume (cm) : [1:0.628] [2:0.033] [N/A:N/A] % Reduction in Area: [1:46.70%] [2:88.30%] [N/A:N/A] % Reduction in Volume: 64.50% [2:88.30%] [N/A:N/A] Classification: [1:Full Thickness Without Exposed Support Structures] [2:Partial Thickness] [N/A:N/A] HBO Classification: [1:Grade 1] [2:N/A] [N/A:N/A] Exudate Amount: [1:Medium] [2:Small] [N/A:N/A] Exudate Type: [1:Serosanguineous] [2:Serosanguineous] [N/A:N/A] Exudate Color: [1:red, brown] [2:red, brown] [N/A:N/A] Wound Margin: [1:Distinct, outline attached] [2:Distinct, outline attached] [N/A:N/A] Granulation Amount: [1:Large (67-100%)] [2:Large  (67-100%)] [N/A:N/A] Granulation Quality: [1:Red] [2:Pink] [N/A:N/A] Necrotic Amount: [1:Small (1-33%)] [2:None Present (0%)] [N/A:N/A] Exposed Structures: [N/A:N/A] Fascia: No Fascia: No Fat: No Fat: No Tendon: No Tendon: No Muscle: No Muscle: No Joint: No Joint: No Bone: No Bone: No Limited to Skin Limited to Skin Breakdown Breakdown Epithelialization: Small (1-33%) None N/A Periwound Skin Texture: Edema: No Edema: No N/A Excoriation: No Excoriation: No Induration: No Induration: No Callus: No Callus: No Crepitus: No Crepitus: No Fluctuance: No Fluctuance: No Friable: No Friable: No Rash: No Rash: No Scarring: No Scarring: No Periwound Skin Moist: Yes Moist: Yes N/A Moisture: Maceration: No Maceration: No Dry/Scaly: No Dry/Scaly: No Periwound Skin Color: Mottled: Yes Atrophie Blanche: No N/A Atrophie Blanche: No Cyanosis: No Cyanosis: No Ecchymosis: No Ecchymosis: No Erythema: No Erythema: No Hemosiderin Staining: No Hemosiderin Staining: No Mottled: No Pallor: No Pallor: No Rubor: No Rubor: No Temperature: No Abnormality No Abnormality N/A Tenderness on Yes Yes N/A Palpation: Wound Preparation: Ulcer Cleansing: Ulcer Cleansing: N/A Rinsed/Irrigated with Rinsed/Irrigated with Saline Saline Topical Anesthetic Topical Anesthetic Applied: None Applied: None, Other: lidocaine 4% Assessment Notes: Apligraf applied last week; N/A N/A measurements from previous week used today/ Treatment Notes Electronic Signature(s) Signed: 01/02/2015 4:53:29 PM By: Gretta Cool, RN, BSN, Kim RN, BSN Entered By: Gretta Cool, RN, BSN, Kim on 01/02/2015 14:49:59 Newbern, [name] ([id number]) Morrish, [name] ([id number]) -------------------------------------------------------------------------------- [place] Details Patient Name: Fauble, Kymoni C. Date of Service: 01/02/2015 2:00 PM Medical Record Number: [id number] Patient Account Number:  [id number] Date of Birth/Sex: 01-Feb-1928 (81 y.o. Female) Treating RN: Cornell Barman Primary Care Physician: Ria Bush Other Clinician: Referring Physician: Ria Bush Treating Physician/Extender: Frann Rider in Treatment: 12 Active Inactive Abuse / Safety / Falls / Self Care Management Nursing Diagnoses: Potential for falls Goals: Patient will remain injury free Date Initiated: 08/15/2014 Goal Status: Active Interventions: Assess fall risk on admission and as needed Notes: Nutrition Nursing Diagnoses: Impaired glucose control: actual or potential Goals: Patient/caregiver verbalizes understanding of need to maintain therapeutic glucose control per primary care physician Date Initiated: 08/15/2014 Goal Status: Active Interventions: Assess patient nutrition upon admission and as needed per policy Notes: Orientation to the Wound Care Program Nursing Diagnoses: Knowledge deficit related to the wound healing center program Goals: Patient/caregiver will verbalize understanding of the [facility] STARLIGHT, HARRISON ([id number]) Date Initiated: 08/15/2014 Goal Status: Active Interventions: Provide education on orientation to the wound center Notes: Venous Leg Ulcer Nursing Diagnoses: Actual venous Insuffiency (use after diagnosis is confirmed) Goals: Patient will maintain optimal edema control Date Initiated: 08/15/2014 Goal Status: Active Interventions: Compression as ordered Treatment Activities: Test ordered outside of clinic : 01/02/2015 Notes: Wound/Skin Impairment Nursing Diagnoses: Impaired tissue integrity Goals: Patient/caregiver will verbalize understanding of skin care  regimen Date Initiated: 08/15/2014 Goal Status: Active Interventions: Assess patient/caregiver ability to obtain necessary supplies Notes: Electronic Signature(s) Signed: 01/02/2015 4:53:29 PM By: Gretta Cool, RN, BSN, Kim RN, BSN Entered By: Gretta Cool, RN, BSN, Kim on  01/02/2015 14:49:51 Armenti, [name] ([id number]) -------------------------------------------------------------------------------- Pain Assessment Details Patient Name: Fitzgibbon, Paelyn C. Date of Service: 01/02/2015 2:00 PM Medical Record Number: [id number] Patient Account Number: [id number] Date of Birth/Sex: 12-30-1927 (81 y.o. Female) Treating RN: Cornell Barman Primary Care Physician: Ria Bush Other Clinician: Referring Physician: Ria Bush Treating Physician/Extender: Frann Rider in Treatment: 20 Active Problems Location of Pain Severity and Description of Pain Patient Has [name] Yes Site Locations Pain Location: Pain in Ulcers With Dressing Change: Yes Duration of the Pain. Constant / Intermittento Intermittent Rate the pain. Current Pain Level: 5 Worst Pain Level: 7 Character of Pain Describe the Pain: Sharp, Shooting, Tender, Throbbing Pain Management and Medication Current Pain Management: Medication: No Cold Application: No Rest: No Massage: No Activity: No T.E.N.S.: No Heat Application: No Leg drop or elevation: No Is the Current Pain Management Inadequate Adequate: How does your pain impact your activities of daily livingo Sleep: No Bathing: No Appetite: No Relationship With Others: No Bladder Continence: No Emotions: No Bowel Continence: No Work: No Toileting: No Drive: No Dressing: No Hobbies: No Engineer, maintenance) Signed: 01/02/2015 4:53:29 PM By: Gretta Cool, RN, BSN, Kim RN, BSN Yanes, Melvern ([id number]) Entered By: Gretta Cool, RN, BSN, Kim on 01/02/2015 14:13:05 Grabski, [name] ([id number]) -------------------------------------------------------------------------------- Patient/Caregiver Education Details Patient Name: Naill, Stacyann C. Date of Service: 01/02/2015 2:00 PM Medical Record Number: [id number] Patient Account Number: [id number] Date of Birth/Gender: [birth year] (81 y.o. Female) Treating RN: Cornell Barman Primary  Care Physician: Ria Bush Other Clinician: Referring Physician: Ria Bush Treating Physician/Extender: Frann Rider in Treatment: 20 Education Assessment Education Provided To: Patient Education Topics Provided Wound/Skin Impairment: Handouts: Caring for Your Ulcer, Other: continue wound care as prescribed Methods: Demonstration, Explain/Verbal Responses: State content correctly Electronic Signature(s) Signed: 01/02/2015 4:53:29 PM By: Gretta Cool, RN, BSN, Kim RN, BSN Entered By: Gretta Cool, RN, BSN, Kim on 01/02/2015 14:57:10 Santillanes, [name] ([id number]) -------------------------------------------------------------------------------- Wound Assessment Details Patient Name: Thayne, Lameka C. Date of Service: 01/02/2015 2:00 PM Medical Record Number: [id number] Patient Account Number: [id number] Date of Birth/Sex: Dec 16, 1927 (81 y.o. Female) Treating RN: Cornell Barman Primary Care Physician: Ria Bush Other Clinician: Referring Physician: Ria Bush Treating Physician/Extender: Frann Rider in Treatment: 20 Wound Status Wound Number: 1 Primary Venous Leg Ulcer Etiology: Wound Location: Right, Medial Lower Leg Wound Open Wounding Event: Gradually Appeared Status: Date Acquired: 04/04/2014 Comorbid Cataracts, Anemia, Coronary Artery Weeks Of Treatment: 20 History: Disease, Hypertension, Myocardial Clustered Wound: No Infarction, Type II Diabetes, Osteoarthritis Photos Photo Uploaded By: Gretta Cool, RN, BSN, Kim on 01/02/2015 17:08:46 Wound Measurements Length: (cm) 4 Width: (cm) 1 Depth: (cm) 0.2 Area: (cm) 3.142 Volume: (cm) 0.628 % Reduction in Area: 46.7% % Reduction in Volume: 64.5% Epithelialization: Small (1-33%) Wound Description Full Thickness Without Exposed Foul Odor Af Classification: Support Structures Grade 1 Seeney, Akaya C. ([id number]) ter Cleansing: No Diabetic Severity Earleen Newport): Wound Margin: Distinct, outline  attached Exudate Amount: Medium Exudate Type: Serosanguineous Exudate Color: red, brown Wound Bed Granulation Amount: Large (67-100%) Exposed Structure Granulation Quality: Red Fascia Exposed: No Necrotic Amount: Small (1-33%) Fat Layer Exposed: No Tendon Exposed: No Muscle Exposed: No Joint Exposed: No Bone Exposed: No Limited to Skin Breakdown Periwound Skin Texture Texture Color No Abnormalities Noted: No No Abnormalities Noted: No Callus: No Atrophie Blanche: No Crepitus:  No Cyanosis: No Excoriation: No Ecchymosis: No Fluctuance: No Erythema: No Friable: No Hemosiderin Staining: No Induration: No Mottled: Yes Localized Edema: No Pallor: No Rash: No Rubor: No Scarring: No Temperature / Pain Moisture Temperature: No Abnormality No Abnormalities Noted: No Tenderness on Palpation: Yes Dry / Scaly: No Maceration: No Moist: Yes Wound Preparation Ulcer Cleansing: Rinsed/Irrigated with Saline Topical Anesthetic Applied: None Assessment Notes Apligraf applied last week; measurements from previous week used today/ Treatment Notes Wound #1 (Right, Medial Lower Leg) 1. Cleansed with: Cleanse wound with antibacterial soap and water 4. Dressing Applied: Hooser, Alaney C. ([id number]) Promogran 5. Secondary Dressing Applied Bordered Foam Dressing 7. Secured with 2 Layer Lite Compression System - Right Lower Extremity Notes Telle Max over promogram on leg Electronic Signature(s) Signed: 01/02/2015 4:53:29 PM By: Gretta Cool, RN, BSN, Kim RN, BSN Entered By: Gretta Cool, RN, BSN, Kim on 01/02/2015 14:40:36 Minish, [name] ([id number]) -------------------------------------------------------------------------------- Wound Assessment Details Patient Name: Higby, Demri C. Date of Service: 01/02/2015 2:00 PM Medical Record Number: [id number] Patient Account Number: [id number] Date of Birth/Sex: 07/23/1927 (81 y.o. Female) Treating RN: Cornell Barman Primary Care Physician:  Ria Bush Other Clinician: Referring Physician: Ria Bush Treating Physician/Extender: Frann Rider in Treatment: 20 Wound Status Wound Number: 2 Primary Skin Tear Etiology: Wound Location: Left Forearm Wound Open Wounding Event: Trauma Status: Date Acquired: 12/10/2014 Comorbid Cataracts, Anemia, Coronary Artery Weeks Of Treatment: 3 History: Disease, Hypertension, Myocardial Clustered Wound: No Infarction, Type II Diabetes, Osteoarthritis Photos Photo Uploaded By: Gretta Cool, RN, BSN, Kim on 01/02/2015 17:09:26 Wound Measurements Length: (cm) 0.7 Width: (cm) 0.6 Depth: (cm) 0.1 Area: (cm) 0.33 Volume: (cm) 0.033 % Reduction in Area: 88.3% % Reduction in Volume: 88.3% Epithelialization: None Wound Description Classification: Partial Thickness Wound Margin: Distinct, outline attached Exudate Amount: Small Exudate Type: Serosanguineous Exudate Color: red, brown Foul Odor After Cleansing: No Wound Bed Granulation Amount: Large (67-100%) Exposed Structure Granulation Quality: Pink Fascia Exposed: No Necrotic Amount: None Present (0%) Fat Layer Exposed: No Strawser, Eleisha C. ([id number]) Tendon Exposed: No Muscle Exposed: No Joint Exposed: No Bone Exposed: No Limited to Skin Breakdown Periwound Skin Texture Texture Color No Abnormalities Noted: No No Abnormalities Noted: No Callus: No Atrophie Blanche: No Crepitus: No Cyanosis: No Excoriation: No Ecchymosis: No Fluctuance: No Erythema: No Friable: No Hemosiderin Staining: No Induration: No Mottled: No Localized Edema: No Pallor: No Rash: No Rubor: No Scarring: No Temperature / Pain Moisture Temperature: No Abnormality No Abnormalities Noted: No Tenderness on Palpation: Yes Dry / Scaly: No Maceration: No Moist: Yes Wound Preparation Ulcer Cleansing: Rinsed/Irrigated with Saline Topical Anesthetic Applied: None, Other: lidocaine 4%, Treatment Notes Wound #2 (Left  Forearm) 1. Cleansed with: Cleanse wound with antibacterial soap and water 4. Dressing Applied: Promogran 5. Secondary Dressing Applied Bordered Foam Dressing 7. Secured with 2 Layer Lite Compression System - Right Lower Extremity Notes Telle Max over promogram on leg Electronic Signature(s) Signed: 01/02/2015 4:53:29 PM By: Gretta Cool, RN, BSN, Kim RN, BSN Entered By: Gretta Cool, RN, BSN, Kim on 01/02/2015 14:20:41 Tennyson, [name] ([id number]) -------------------------------------------------------------------------------- [place] Details Patient Name: Ruddick, Patsy C. Date of Service: 01/02/2015 2:00 PM Medical Record Number: [id number] Patient Account Number: [id number] Date of Birth/Sex: January 16, 1928 (81 y.o. Female) Treating RN: Cornell Barman Primary Care Physician: Ria Bush Other Clinician: Referring Physician: Ria Bush Treating Physician/Extender: Frann Rider in Treatment: 20 Vital Signs Time Taken: 14:13 Temperature (F): 98.1 Height (in): 58 Pulse (bpm): 74 Weight (lbs): 151 Respiratory Rate (breaths/min): 18 Body Mass Index (BMI): 31.6 Blood Pressure (  mmHg): 136/47 Reference Range: 80 - 120 mg / dl Electronic Signature(s) Signed: 01/02/2015 4:53:29 PM By: Gretta Cool, RN, BSN, Kim RN, BSN Entered By: Gretta Cool, RN, BSN, Kim on 01/02/2015 14:13:23

## 2015-01-08 ENCOUNTER — Encounter: Payer: Medicare Other | Attending: General Surgery | Admitting: General Surgery

## 2015-01-08 DIAGNOSIS — I2510 Atherosclerotic heart disease of native coronary artery without angina pectoris: Secondary | ICD-10-CM

## 2015-01-08 DIAGNOSIS — I83013 Varicose veins of right lower extremity with ulcer of ankle: Secondary | ICD-10-CM

## 2015-01-08 DIAGNOSIS — I87331 Chronic venous hypertension (idiopathic) with ulcer and inflammation of right lower extremity: Secondary | ICD-10-CM | POA: Insufficient documentation

## 2015-01-08 DIAGNOSIS — I87311 Chronic venous hypertension (idiopathic) with ulcer of right lower extremity: Secondary | ICD-10-CM | POA: Diagnosis not present

## 2015-01-08 DIAGNOSIS — I10 Essential (primary) hypertension: Secondary | ICD-10-CM | POA: Insufficient documentation

## 2015-01-08 DIAGNOSIS — E11610 Type 2 diabetes mellitus with diabetic neuropathic arthropathy: Secondary | ICD-10-CM | POA: Insufficient documentation

## 2015-01-08 DIAGNOSIS — X58XXXA Exposure to other specified factors, initial encounter: Secondary | ICD-10-CM | POA: Diagnosis not present

## 2015-01-08 DIAGNOSIS — L97211 Non-pressure chronic ulcer of right calf limited to breakdown of skin: Secondary | ICD-10-CM | POA: Diagnosis present

## 2015-01-08 DIAGNOSIS — E11622 Type 2 diabetes mellitus with other skin ulcer: Secondary | ICD-10-CM | POA: Insufficient documentation

## 2015-01-08 DIAGNOSIS — S41112A Laceration without foreign body of left upper arm, initial encounter: Secondary | ICD-10-CM | POA: Insufficient documentation

## 2015-01-08 DIAGNOSIS — L03115 Cellulitis of right lower limb: Secondary | ICD-10-CM | POA: Diagnosis not present

## 2015-01-08 DIAGNOSIS — L97319 Non-pressure chronic ulcer of right ankle with unspecified severity: Secondary | ICD-10-CM

## 2015-01-08 NOTE — Progress Notes (Signed)
seeiheal 

## 2015-01-09 NOTE — Progress Notes (Addendum)
[name], [name] Visit Report for 01/08/2015 Arrival Information Details Patient Name: [name], [name] Date of Service: 01/08/2015 3:45 PM Medical Record Number: [id number] Patient Account Number: [id number] Date of Birth/Sex: 1928/02/27 (81 y.o. Female) Treating RN: Montey Hora Primary Care Physician: Ria Bush Other Clinician: Referring Physician: Ria Bush Treating Physician/Extender: Frann Rider in Treatment: 37 Visit Information History Since Last Visit Added or deleted any medications: No Patient Arrived: Walker Any new allergies or adverse reactions: No Arrival Time: 15:43 Had a fall or experienced change in No Accompanied By: self activities of daily living that may affect Transfer Assistance: None risk of falls: Patient Identification Verified: Yes Signs or symptoms of abuse/neglect since last No Secondary Verification Process Yes visito Completed: Hospitalized since last visit: No Patient Has Alerts: Yes Pain Present Now: No Patient Alerts: Patient on Blood Thinner Type II Diabetic 81 MG aspirin Electronic Signature(s) Signed: 01/08/2015 4:56:28 PM By: Montey Hora Entered By: Montey Hora on 01/08/2015 15:45:43 Gafford, [name] ([id number]) -------------------------------------------------------------------------------- Encounter Discharge Information Details Patient Name: [name], [name] Date of Service: 01/08/2015 3:45 PM Medical Record Number: [id number] Patient Account Number: [id number] Date of Birth/Sex: October 25, 1927 (81 y.o. Female) Treating RN: Montey Hora Primary Care Physician: Ria Bush Other Clinician: Referring Physician: Ria Bush Treating Physician/Extender: Frann Rider in Treatment: 20 Encounter Discharge Information Items Discharge Pain Level: 0 Discharge Condition: Stable Ambulatory Status: Walker Discharge Destination: Home Transportation: Private Auto Accompanied  By: self Schedule Follow-up Appointment: Yes Medication Reconciliation completed No and provided to Patient/Care Andersson Larrabee: Provided on Clinical Summary of Care: 01/08/2015 Form Type Recipient Paper Patient LL Electronic Signature(s) Signed: 01/08/2015 5:11:41 PM By: Judene Companion MD Previous Signature: 01/08/2015 4:31:02 PM Version By: Ruthine Dose Entered By: Judene Companion on 01/08/2015 17:09:21 Pitstick, [name] ([id number]) -------------------------------------------------------------------------------- Lower Extremity Assessment Details Patient Name: [name], [name] Date of Service: 01/08/2015 3:45 PM Medical Record Number: [id number] Patient Account Number: [id number] Date of Birth/Sex: February 20, 1928 (81 y.o. Female) Treating RN: Montey Hora Primary Care Physician: Ria Bush Other Clinician: Referring Physician: Ria Bush Treating Physician/Extender: Frann Rider in Treatment: 20 Edema Assessment Assessed: [Left: No] [Right: No] E[Left: dema] [Right: :] Calf Left: Right: Point of Measurement: 27 cm From Medial Instep cm 26.4 cm Ankle Left: Right: Point of Measurement: 10 cm From Medial Instep cm 20.2 cm Vascular Assessment Pulses: Posterior Tibial Dorsalis Pedis Palpable: [Right:Yes] Extremity colors, hair growth, and conditions: Extremity Color: [Right:Mottled] Hair Growth on Extremity: [Right:No] Temperature of Extremity: [Right:Warm] Capillary Refill: [Right:< 3 seconds] Toe Nail Assessment Left: Right: Thick: Yes Discolored: Yes Deformed: Yes Improper Length and Hygiene: No Electronic Signature(s) Signed: 01/08/2015 4:56:28 PM By: Montey Hora Entered By: Montey Hora on 01/08/2015 15:59:42 [name] Kitchen ([id number]) -------------------------------------------------------------------------------- Multi Wound Chart Details Patient Name: [name], [name] Date of Service: 01/08/2015 3:45 PM Medical Record Number:  [id number] Patient Account Number: [id number] Date of Birth/Sex: September 21, 1927 (81 y.o. Female) Treating RN: Montey Hora Primary Care Physician: Ria Bush Other Clinician: Referring Physician: Ria Bush Treating Physician/Extender: Frann Rider in Treatment: 20 Vital Signs Height(in): 58 Pulse(bpm): 69 Weight(lbs): 151 Blood Pressure 137/65 (mmHg): Body Mass Index(BMI): 32 Temperature(F): 98.3 Respiratory Rate 18 (breaths/min): Photos: [1:No Photos] [2:No Photos] [N/A:N/A] Wound Location: [1:Right Lower Leg - Medial] [2:Left Forearm] [N/A:N/A] Wounding Event: [1:Gradually Appeared] [2:Trauma] [N/A:N/A] Primary Etiology: [1:Venous Leg Ulcer] [2:Skin Tear] [N/A:N/A] Comorbid History: [1:Cataracts, Anemia, Coronary Artery Disease, Hypertension, Myocardial Infarction, Type II Diabetes, Osteoarthritis] [2:N/A] [N/A:N/A] Date Acquired: [1:04/04/2014] [2:12/10/2014] [N/A:N/A] Weeks of Treatment: [1:20] [2:3] [  N/A:N/A] Wound Status: [1:Open] [2:Healed - Epithelialized] [N/A:N/A] Measurements L x W x D 3.4x1x0.2 [2:0x0x0] [N/A:N/A] (cm) Area (cm) : [1:2.67] [2:0] [N/A:N/A] Volume (cm) : [1:0.534] [2:0] [N/A:N/A] % Reduction in Area: [1:54.70%] [2:100.00%] [N/A:N/A] % Reduction in Volume: 69.80% [2:100.00%] [N/A:N/A] Classification: [1:Full Thickness Without Exposed Support Structures] [2:Partial Thickness] [N/A:N/A] HBO Classification: [1:Grade 1] [2:N/A] [N/A:N/A] Exudate Amount: [1:Medium] [2:N/A] [N/A:N/A] Exudate Type: [1:Serosanguineous] [2:N/A] [N/A:N/A] Exudate Color: [1:red, brown] [2:N/A] [N/A:N/A] Wound Margin: [1:Distinct, outline attached] [2:N/A] [N/A:N/A] Granulation Amount: [1:Large (67-100%)] [2:N/A] [N/A:N/A] Granulation Quality: [1:Red] [2:N/A] [N/A:N/A] Necrotic Amount: [1:Small (1-33%)] [2:N/A] [N/A:N/A] Exposed Structures: [2:N/A] [N/A:N/A] Fascia: No Fat: No Tendon: No Muscle: No Joint: No Bone: No Limited to  Skin Breakdown Epithelialization: Small (1-33%) N/A N/A Periwound Skin Texture: Edema: No No Abnormalities Noted N/A Excoriation: No Induration: No Callus: No Crepitus: No Fluctuance: No Friable: No Rash: No Scarring: No Periwound Skin Moist: Yes No Abnormalities Noted N/A Moisture: Maceration: No Dry/Scaly: No Periwound Skin Color: Mottled: Yes No Abnormalities Noted N/A Atrophie Blanche: No Cyanosis: No Ecchymosis: No Erythema: No Hemosiderin Staining: No Pallor: No Rubor: No Temperature: No Abnormality N/A N/A Tenderness on Yes No N/A Palpation: Wound Preparation: Ulcer Cleansing: Other: N/A N/A soap and water Topical Anesthetic Applied: Other: lidocaine 4% Treatment Notes Electronic Signature(s) Signed: 01/08/2015 4:56:28 PM By: Montey Hora Entered By: Montey Hora on 01/08/2015 16:00:38 Heying, [name] ([id number]) -------------------------------------------------------------------------------- Wisdom Details Patient Name: Brentlinger, Dru C. Date of Service: 01/08/2015 3:45 PM Medical Record Number: [id number] Patient Account Number: [id number] Date of Birth/Sex: 03-Jun-1927 (81 y.o. Female) Treating RN: Montey Hora Primary Care Physician: Ria Bush Other Clinician: Referring Physician: Ria Bush Treating Physician/Extender: Frann Rider in Treatment: 44 Active Inactive Abuse / Safety / Falls / Self Care Management Nursing Diagnoses: Potential for falls Goals: Patient will remain injury free Date Initiated: 08/15/2014 Goal Status: Active Interventions: Assess fall risk on admission and as needed Notes: Nutrition Nursing Diagnoses: Impaired glucose control: actual or potential Goals: Patient/caregiver verbalizes understanding of need to maintain therapeutic glucose control per primary care physician Date Initiated: 08/15/2014 Goal Status: Active Interventions: Assess patient nutrition upon  admission and as needed per policy Notes: Orientation to the Wound Care Program Nursing Diagnoses: Knowledge deficit related to the wound healing center program Goals: Patient/caregiver will verbalize understanding of the [place], Janyla C. ([id number]) Date Initiated: 08/15/2014 Goal Status: Active Interventions: Provide education on orientation to the wound center Notes: Venous Leg Ulcer Nursing Diagnoses: Actual venous Insuffiency (use after diagnosis is confirmed) Goals: Patient will maintain optimal edema control Date Initiated: 08/15/2014 Goal Status: Active Interventions: Compression as ordered Treatment Activities: Test ordered outside of clinic : 01/08/2015 Notes: Wound/Skin Impairment Nursing Diagnoses: Impaired tissue integrity Goals: Patient/caregiver will verbalize understanding of skin care regimen Date Initiated: 08/15/2014 Goal Status: Active Interventions: Assess patient/caregiver ability to obtain necessary supplies Notes: Electronic Signature(s) Signed: 01/08/2015 4:56:28 PM By: Montey Hora Entered By: Montey Hora on 01/08/2015 16:00:30 Puertas, [name] ([id number]) -------------------------------------------------------------------------------- Patient/Caregiver Education Details Patient Name: Nation, Patches C. Date of Service: 01/08/2015 3:45 PM Medical Record Number: [id number] Patient Account Number: [id number] Date of Birth/Gender: 12/31/1927 (81 y.o. Female) Treating RN: Montey Hora Primary Care Physician: Ria Bush Other Clinician: Referring Physician: Ria Bush Treating Physician/Extender: Frann Rider in Treatment: 20 Education Assessment Education Provided To: Patient Education Topics Provided Venous: Handouts: Other: leg elevation Methods: Explain/Verbal Responses: State content correctly Electronic Signature(s) Signed: 01/08/2015 5:11:41 PM By: Judene Companion MD Previous  Signature: 01/08/2015 4:56:28  PM Version By: Montey Hora Entered By: Judene Companion on 01/08/2015 17:09:29 Rajkumar, [name] ([id number]) -------------------------------------------------------------------------------- Wound Assessment Details Patient Name: Enamorado, Dean C. Date of Service: 01/08/2015 3:45 PM Medical Record Number: [id number] Patient Account Number: [id number] Date of Birth/Sex: 08-10-1927 (81 y.o. Female) Treating RN: Montey Hora Primary Care Physician: Ria Bush Other Clinician: Referring Physician: Ria Bush Treating Physician/Extender: Frann Rider in Treatment: 20 Wound Status Wound Number: 1 Primary Venous Leg Ulcer Etiology: Wound Location: Right Lower Leg - Medial Wound Open Wounding Event: Gradually Appeared Status: Date Acquired: 04/04/2014 Comorbid Cataracts, Anemia, Coronary Artery Weeks Of Treatment: 20 History: Disease, Hypertension, Myocardial Clustered Wound: No Infarction, Type II Diabetes, Osteoarthritis Photos Photo Uploaded By: Montey Hora on 01/08/2015 16:44:43 Wound Measurements Length: (cm) 3.4 Width: (cm) 1 Depth: (cm) 0.2 Area: (cm) 2.67 Volume: (cm) 0.534 % Reduction in Area: 54.7% % Reduction in Volume: 69.8% Epithelialization: Small (1-33%) Tunneling: No Undermining: No Wound Description Full Thickness Without Exposed Foul Odor A Classification: Support Structures Diabetic Severity Grade 1 (Wagner): Wound Margin: Distinct, outline attached Exudate Amount: Medium Exudate Type: Serosanguineous Exudate Color: red, brown fter Cleansing: No Wound Bed Deveney, Alejandra C. ([id number]) Granulation Amount: Large (67-100%) Exposed Structure Granulation Quality: Red Fascia Exposed: No Necrotic Amount: Small (1-33%) Fat Layer Exposed: No Tendon Exposed: No Muscle Exposed: No Joint Exposed: No Bone Exposed: No Limited to Skin Breakdown Periwound Skin Texture Texture Color No Abnormalities  Noted: No No Abnormalities Noted: No Callus: No Atrophie Blanche: No Crepitus: No Cyanosis: No Excoriation: No Ecchymosis: No Fluctuance: No Erythema: No Friable: No Hemosiderin Staining: No Induration: No Mottled: Yes Localized Edema: No Pallor: No Rash: No Rubor: No Scarring: No Temperature / Pain Moisture Temperature: No Abnormality No Abnormalities Noted: No Tenderness on Palpation: Yes Dry / Scaly: No Maceration: No Moist: Yes Wound Preparation Ulcer Cleansing: Other: soap and water, Topical Anesthetic Applied: Other: lidocaine 4%, Treatment Notes Wound #1 (Right, Medial Lower Leg) 1. Cleansed with: Cleanse wound with antibacterial soap and water 2. Anesthetic Topical Lidocaine 4% cream to wound bed prior to debridement 4. Dressing Applied: Aquacel Ag 5. Secondary Dressing Applied Foam 7. Secured with 2 Layer Compression System - Right Lower Extremity Notes Telle Max over Aquacel Ag on leg Lapidus, Treena C. ([id number]) Electronic Signature(s) Signed: 01/08/2015 4:56:28 PM By: Montey Hora Entered By: Montey Hora on 01/08/2015 15:58:29 Beil, Melanni C. ([id number]) -------------------------------------------------------------------------------- Wound Assessment Details Patient Name: Moser, Jannie C. Date of Service: 01/08/2015 3:45 PM Medical Record Number: [id number] Patient Account Number: [id number] Date of Birth/Sex: 03-07-1928 (81 y.o. Female) Treating RN: Montey Hora Primary Care Physician: Ria Bush Other Clinician: Referring Physician: Ria Bush Treating Physician/Extender: Frann Rider in Treatment: 20 Wound Status Wound Number: 2 Primary Etiology: Skin Tear Wound Location: Left Forearm Wound Status: Healed - Epithelialized Wounding Event: Trauma Date Acquired: 12/10/2014 Weeks Of Treatment: 3 Clustered Wound: No Photos Photo Uploaded By: Montey Hora on 01/08/2015 16:44:44 Wound Measurements Length:  (cm) 0 Width: (cm) 0 Depth: (cm) 0 Area: (cm) 0 Volume: (cm) 0 % Reduction in Area: 100% % Reduction in Volume: 100% Wound Description Classification: Partial Thickness Periwound Skin Texture Texture Color No Abnormalities Noted: No No Abnormalities Noted: No Moisture No Abnormalities Noted: No Electronic Signature(s) Signed: 01/08/2015 4:56:28 PM By: Joanie Coddington ([id number]) Entered By: Montey Hora on 01/08/2015 15:57:54 Lehrman, [name] ([id number]) -------------------------------------------------------------------------------- Vitals Details Patient Name: Lamons, Margaux C. Date of Service: 01/08/2015 3:45 PM Medical Record Number: [id number] Patient Account Number: [id number] Date of  Birth/Sex: 1927-06-14 (81 y.o. Female) Treating RN: Montey Hora Primary Care Physician: Ria Bush Other Clinician: Referring Physician: Ria Bush Treating Physician/Extender: Frann Rider in Treatment: 20 Vital Signs Time Taken: 15:46 Temperature (F): 98.3 Height (in): 58 Pulse (bpm): 69 Weight (lbs): 151 Respiratory Rate (breaths/min): 18 Body Mass Index (BMI): 31.6 Blood Pressure (mmHg): 137/65 Reference Range: 80 - 120 mg / dl Electronic Signature(s) Signed: 01/08/2015 4:56:28 PM By: Montey Hora Entered By: Montey Hora on 01/08/2015 15:46:50

## 2015-01-11 NOTE — Assessment & Plan Note (Signed)
ABI 2013 - R 0.91, L 0.83

## 2015-01-12 ENCOUNTER — Other Ambulatory Visit: Payer: Self-pay | Admitting: Family Medicine

## 2015-01-13 NOTE — Progress Notes (Addendum)
AMNA, MICHELIN ([id number]) Visit Report for 01/08/2015 Chief Complaint Document Details Patient Name: [name], [name] Date of Service: 01/08/2015 3:45 PM Medical Record Number: [id number] Patient Account Number: [id number] Date of Birth/Sex: 02-Apr-1928 (81 y.o. Female) Treating RN: Montey Hora Primary Care Physician: Ria Bush Other Clinician: Referring Physician: Ria Bush Treating Physician/Extender: Frann Rider in Treatment: 20 Information Obtained from: Patient Chief Complaint Patient presents for treatment of an open ulcer due to venous insufficiency. 81 y.o. patient who is known to me from the [facility] wound care center now comes in follow up here for a chronic venous ulceration of her right lower extremity which she's had for about 6 months. Electronic Signature(s) Signed: 01/08/2015 5:11:41 PM By: Judene Companion MD Signed: 01/13/2015 8:06:26 AM By: Christin Fudge MD, FACS Entered By: Judene Companion on 01/08/2015 17:06:10 [name], [name] [name] -------------------------------------------------------------------------------- Debridement Details Patient Name: [name], [name] Date of Service: 01/08/2015 3:45 PM Medical Record Number: [id number] Patient Account Number: [id number] Date of Birth/Sex: 1927-11-19 (81 y.o. Female) Treating RN: Montey Hora Primary Care Physician: Ria Bush Other Clinician: Referring Physician: Ria Bush Treating Physician/Extender: Frann Rider in Treatment: 20 Debridement Performed for Wound #1 Right,Medial Lower Leg Assessment: Performed By: Physician Christin Fudge, MD Debridement: Debridement Pre-procedure Yes Verification/Time Out Taken: Start Time: 16:05 Pain Control: Lidocaine 4% Topical Solution Level: Skin/Subcutaneous Tissue Total Area Debrided (L x 3.4 (cm) x 1 (cm) = 3.4 (cm) W): Tissue and other Viable, Non-Viable, Fibrin/Slough, Subcutaneous material  debrided: Instrument: Curette Bleeding: Minimum Hemostasis Achieved: Pressure End Time: 16:06 Procedural Pain: 0 Post Procedural Pain: 5 Response to Treatment: Procedure was tolerated well Post Debridement Measurements of Total Wound Length: (cm) 3.4 Width: (cm) 1 Depth: (cm) 0.2 Volume: (cm) 0.534 Post Procedure Diagnosis Same as Pre-procedure Electronic Signature(s) Signed: 01/08/2015 4:56:28 PM By: Montey Hora Signed: 01/13/2015 8:06:26 AM By: Christin Fudge MD, FACS Entered By: Montey Hora on 01/08/2015 16:08:11 [name], [name] -------------------------------------------------------------------------------- HPI Details Patient Name: [name], [name] Date of Service: 01/08/2015 3:45 PM Medical Record Number: [id number] Patient Account Number: [id number] Date of Birth/Sex: 01/20/1928 (81 y.o. Female) Treating RN: Montey Hora Primary Care Physician: Ria Bush Other Clinician: Referring Physician: Ria Bush Treating Physician/Extender: Frann Rider in Treatment: 20 History of Present Illness Location: ulcer in the right lower extremity for several months Quality: Patient reports experiencing a dull pain to affected area(s). Severity: Patient states wound (s) are getting better. Duration: Patient has had the wound for > 3 months prior to seeking treatment at the wound center Timing: Pain in wound is Intermittent (comes and goes Context: The wound appeared gradually over time Modifying Factors: Consults to this date include: vascular surgery for the veins in December 2015 and she's been seeing the wound care center at [facility] for several months. Associated Signs and Symptoms: Patient reports having difficulty standing for long periods. HPI Description: This is an 81 yrs old female we have been following since the beginning of January for a wound on the right medial ankle. She had previously had venous ablation of the greater  saphenous vein by Dr. Kellie Simmering. She came to [place] with erythema of the right leg and subsequently is opened in the medial aspect of the right ankle. She has not been very compliant with compression. We ordered her at juxta lite stocking, however she apparently has not been able to wear it. The area on the medial aspect of her left ankle has much less induration and erythema. The edema is better controlled. No  evidence of infection is noted. 07/18/14; no major change in the condition of this wound. There is still surface slough and probably some drainage. 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 All other veins showed no evidence of DVT, superficial venous thrombosis or incompetence. however she does have manifestations of chronic venous hypertension and I believe we will change her dressing over to Prisma and a Profore light dressing. she sees her primary care doctor regularly and her last hemoglobin A1c has been around 7%.her other comorbidities are hypertension, CABG in 1998, peptic ulcer disease, history of echo with a ejection fraction of 55-60% and grade 1 diastolic dysfunction. past surgical history include coronary artery bypass graft in 1998, cholecystectomy, appendectomy, hand surgery, endovenous ablation of the saphenous vein with laser by Dr. Kellie Simmering in December 2015. 08/29/2014 - she complains of increased calf pain over the past week. removed her compression bandage and pain improved. no fever or chills. Mild clear drainage. 09/04/2014 -- she has not been compliant and has not done a dressing change as we had ordered last week. She always complains and does not keep her wrap on and this past week she has done the same. She has completed a course of doxycycline. 09/25/2014 -- she has tolerated her dressing all week and has not removed it. She says it's hurting a  lot today and would not like any debridement. We will do so as per her wishes. she did get a appointment with the vascular surgeon and sees him on 6/28. [name] ([id number]) 10/03/2014 - She was seen by Dr. Tinnie Gens 09/30/2014. Assessment: I reviewed the previous venous duplex exam study performed in September 2015 which reveals no options for treating this venous ulcer. There is no significant superficial venous reflux to treat with laser ablation.Most recent ABI was 0.91 in the right foot with palpable dorsalis pedis pulse at 3+ Plan: patient needs to return to the [place] wound center and be treated by Dr. Con Memos. No arterial or venous options available from vascular standpoint or treatment of right leg stasis ulcer.Continued dressing changes and compression is the treatment of choice 10/16/2014 -- today will be her first application of Apligraf. 10/23/2014 -- we are going to be able to apply her second day of Apligraf next week. She is complaining of some tenderness in the region of Achilles tendon and this is possibly due to her wrap. 11/06/2014 -- she did not have Apligraf last week because the wound was not looking good but she says she is feeling much better now. 11/13/2014 -- she has been doing well this week and she is here for her second Apligraf. 11/27/2014 -- she is here for her third application of Apligraf 12/12/2014 -- she is here for her fourth application of Apligraf. Also during these past 3 days she had an injury to her left forearm which she caught against the edge of her desk and has a lacerated wound which has gone down to the subcutaneous tissue. 12/26/2014 -- she is here for a last/fifth application of Apligraf. Electronic Signature(s) Signed: 01/08/2015 5:11:41 PM By: Judene Companion MD Signed: 01/13/2015 8:06:26 AM By: Christin Fudge MD, FACS Entered By: Judene Companion on 01/08/2015 17:06:31 Dufrane, [name] [name]  ([id number]) -------------------------------------------------------------------------------- Physical Exam Details Patient Name: Balling, Wynette C. Date of Service: 01/08/2015 3:45 PM Medical Record Number: [id number] Patient Account Number: [id number] Date of  Birth/Sex: 06-Aug-1927 (81 y.o. Female) Treating RN: Montey Hora Primary Care Physician: Ria Bush Other Clinician: Referring Physician: Ria Bush Treating Physician/Extender: Frann Rider in Treatment: 20 Electronic Signature(s) Signed: 01/08/2015 5:11:41 PM By: Judene Companion MD Signed: 01/13/2015 8:06:26 AM By: Christin Fudge MD, FACS Entered By: Judene Companion on 01/08/2015 17:06:38 [name], [name] [name] ([id number]) -------------------------------------------------------------------------------- Physician Orders Details Patient Name: [name], [name] Date of Service: 01/08/2015 3:45 PM Medical Record Number: [id number] Patient Account Number: [id number] Date of Birth/Sex: 05/20/1927 (81 y.o. Female) Treating RN: Montey Hora Primary Care Physician: Ria Bush Other Clinician: Referring Physician: Ria Bush Treating Physician/Extender: Benjaman Pott in Treatment: 20 Verbal / Phone Orders: Yes Clinician: Montey Hora Read Back and Verified: Yes Diagnosis Coding Wound Cleansing Wound #1 Right,Medial Lower Leg o Cleanse wound with mild soap and water Primary Wound Dressing Wound #1 Right,Medial Lower Leg o Aquacel Ag Secondary Dressing Wound #1 Right,Medial Lower Leg o Foam - Telle Max Dressing Change Frequency Wound #1 Right,Medial Lower Leg o Change dressing every week Follow-up Appointments Wound #1 Right,Medial Lower Leg o Return Appointment in 1 week. Edema Control Wound #1 Right,Medial Lower Leg o 2 Layer Compression System - Right Lower Extremity Electronic Signature(s) Signed: 01/13/2015 9:43:56 AM By: Judene Companion MD Previous Signature: 01/08/2015  4:56:28 PM Version By: Montey Hora Previous Signature: 01/13/2015 8:06:26 AM Version By: Christin Fudge MD, FACS Entered By: Judene Companion on 01/13/2015 09:43:56 Byrd, [name] [name] ([id number]) -------------------------------------------------------------------------------- Problem List Details Patient Name: [name], [name] Date of Service: 01/08/2015 3:45 PM Medical Record Number: [id number] Patient Account Number: [id number] Date of Birth/Sex: [birth year] (81 y.o. Female) Treating RN: Montey Hora Primary Care Physician: Ria Bush Other Clinician: Referring Physician: Ria Bush Treating Physician/Extender: Frann Rider in Treatment: 20 Active Problems ICD-10 Encounter Code Description Active Date Diagnosis E11.622 Type 2 diabetes mellitus with other skin ulcer 08/15/2014 Yes E11.610 Type 2 diabetes mellitus with diabetic neuropathic 08/15/2014 Yes arthropathy I87.331 Chronic venous hypertension (idiopathic) with ulcer and 08/15/2014 Yes inflammation of right lower extremity L03.115 Cellulitis of right lower limb 08/29/2014 Yes S41.112A Laceration without foreign body of left upper arm, initial 12/12/2014 Yes encounter Inactive Problems Resolved Problems Electronic Signature(s) Signed: 01/08/2015 5:11:41 PM By: Judene Companion MD Signed: 01/13/2015 8:06:26 AM By: Christin Fudge MD, FACS Entered By: Judene Companion on 01/08/2015 17:05:59 Yaeger, [name] [name] ([id number]) -------------------------------------------------------------------------------- Progress Note Details Patient Name: [name], [name] Date of Service: 01/08/2015 3:45 PM Medical Record Number: [id number] Patient Account Number: [id number] Date of Birth/Sex: Jun 03, 1927 (81 y.o. Female) Treating RN: Montey Hora Primary Care Physician: Ria Bush Other Clinician: Referring Physician: Ria Bush Treating Physician/Extender: Frann Rider in Treatment: 20 Subjective Chief  Complaint Information obtained from Patient Patient presents for treatment of an open ulcer due to venous insufficiency. 81 year old patient who is known to me from the [facility] wound care center now comes in follow up here for a chronic venous ulceration of her right lower extremity which she's had for about 6 months. History of Present Illness (HPI) The following HPI elements were documented for the patient's wound: Location: ulcer in the right lower extremity for several months Quality: Patient reports experiencing a dull pain to affected area(s). Severity: Patient states wound (s) are getting better. Duration: Patient has had the wound for > 3 months prior to seeking treatment at the wound center Timing: Pain in wound is Intermittent (comes and goes Context: The wound appeared gradually over time Modifying Factors: Consults to this date include: vascular surgery for the veins  in December 2015 and she's been seeing the wound care center at [facility] for several months. Associated Signs and Symptoms: Patient reports having difficulty standing for long periods. This is an 81 yrs old female we have been following since the beginning of January for a wound on the right medial ankle. She had previously had venous ablation of the greater saphenous vein by Dr. Kellie Simmering. She came to [place] with erythema of the right leg and subsequently is opened in the medial aspect of the right ankle. She has not been very compliant with compression. We ordered her at juxta lite stocking, however she apparently has not been able to wear it. The area on the medial aspect of her left ankle has much less induration and erythema. The edema is better controlled. No evidence of infection is noted. 07/18/14; no major change in the condition of this wound. There is still surface slough and probably some drainage. 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 All other veins showed no evidence of DVT, superficial venous thrombosis or incompetence. however she does have manifestations of chronic venous hypertension and I believe we will change her dressing over to Prisma and a Profore light dressing. she sees her primary care doctor regularly and her last hemoglobin A1c has been around 7%.her other comorbidities are hypertension, CABG in 1998, peptic ulcer disease, history of echo with a ejection fraction of 55-60% and grade 1 diastolic dysfunction. past surgical history include coronary artery bypass graft in 1998, cholecystectomy, appendectomy, hand surgery, endovenous ablation of the saphenous vein with laser by Dr. Kellie Simmering in December 2015. [name], [name] ([id number]) 08/29/2014 - she complains of increased calf pain over the past week. removed her compression bandage and pain improved. no fever or chills. Mild clear drainage. 09/04/2014 -- she has not been compliant and has not done a dressing change as we had ordered last week. She always complains and does not keep her wrap on and this past week she has done the same. She has completed a course of doxycycline. 09/25/2014 -- she has tolerated her dressing all week and has not removed it. She says it's hurting a lot today and would not like any debridement. We will do so as per her wishes. she did get a appointment with the vascular surgeon and sees him on 6/28. 10/03/2014 - She was seen by Dr. Tinnie Gens 09/30/2014. Assessment: I reviewed the previous venous duplex exam study performed in September 2015 which reveals no options for treating this venous ulcer. There is no significant superficial venous reflux to treat with laser ablation.Most recent ABI was 0.91 in the right foot with palpable dorsalis pedis pulse at 3+ Plan: patient needs to return to the [place] wound center and be treated by Dr. Con Memos. No arterial or venous options  available from vascular standpoint or treatment of right leg stasis ulcer.Continued dressing changes and compression is the treatment of choice 10/16/2014 -- today will be her first application of Apligraf. 10/23/2014 -- we are going to be able to apply her second day of Apligraf next week. She is complaining of some tenderness in the region of Achilles tendon and this is possibly due to her wrap. 11/06/2014 -- she did not have Apligraf last week because the wound was not looking good but she says she is feeling much better now. 11/13/2014 -- she has been doing  well this week and she is here for her second Apligraf. 11/27/2014 -- she is here for her third application of Apligraf 12/12/2014 -- she is here for her fourth application of Apligraf. Also during these past 3 days she had an injury to her left forearm which she caught against the edge of her desk and has a lacerated wound which has gone down to the subcutaneous tissue. 12/26/2014 -- she is here for a last/fifth application of Apligraf. Objective Constitutional Vitals Time Taken: 3:46 PM, Height: 58 in, Weight: 151 lbs, BMI: 31.6, Temperature: 98.3 F, Pulse: 69 bpm, Respiratory Rate: 18 breaths/min, Blood Pressure: 137/65 mmHg. Integumentary (Hair, Skin) Wound #1 status is Open. Original cause of wound was Gradually Appeared. The wound is located on the Right,Medial Lower Leg. The wound measures 3.4cm length x 1cm width x 0.2cm depth; 2.67cm^2 area and 0.534cm^3 volume. The wound is limited to skin breakdown. There is no tunneling or undermining noted. There is a medium amount of serosanguineous drainage noted. The wound margin is distinct with the outline attached to the wound base. There is large (67-100%) red granulation within the wound bed. [name], [name] ([id number]) There is a small (1-33%) amount of necrotic tissue within the wound bed. The periwound skin appearance exhibited: Moist, Mottled. The periwound skin  appearance did not exhibit: Callus, Crepitus, Excoriation, Fluctuance, Friable, Induration, Localized Edema, Rash, Scarring, Dry/Scaly, Maceration, Atrophie Blanche, Cyanosis, Ecchymosis, Hemosiderin Staining, Pallor, Rubor, Erythema. Periwound temperature was noted as No Abnormality. The periwound has tenderness on palpation. Wound #2 status is Healed - Epithelialized. Original cause of wound was Trauma. The wound is located on the Left Forearm. The wound measures 0cm length x 0cm width x 0cm depth; 0cm^2 area and 0cm^3 volume. Assessment Active Problems ICD-10 E11.622 - Type 2 diabetes mellitus with other skin ulcer E11.610 - Type 2 diabetes mellitus with diabetic neuropathic arthropathy I87.331 - Chronic venous hypertension (idiopathic) with ulcer and inflammation of right lower extremity L03.115 - Cellulitis of right lower limb S41.112A - Laceration without foreign body of left upper arm, initial encounter Procedures Wound #1 Wound #1 is a Venous Leg Ulcer located on the Right,Medial Lower Leg . There was a Skin/Subcutaneous Tissue Debridement [id number]) debridement with total area of 3.4 sq cm performed by Christin Fudge, MD. with the following instrument(s): Curette to remove Viable and Non-Viable tissue/material including Fibrin/Slough and Subcutaneous after achieving pain control using Lidocaine 4% Topical Solution. A time out was conducted prior to the start of the procedure. A Minimum amount of bleeding was controlled with Pressure. The procedure was tolerated well with a pain level of 0 throughout and a pain level of 5 following the procedure. Post Debridement Measurements: 3.4cm length x 1cm width x 0.2cm depth; 0.534cm^3 volume. Post procedure Diagnosis Wound #1: Same as Pre-Procedure Debrided ulcer right leg with curette . Dressedwith silver alginate. A [name] smaller [name], [name] ([id number]) Plan Wound Cleansing: Wound #1 Right,Medial Lower Leg: Cleanse wound  with mild soap and water Primary Wound Dressing: Wound #1 Right,Medial Lower Leg: Aquacel Ag Secondary Dressing: Wound #1 Right,Medial Lower Leg: Foam - Telle Max Dressing Change Frequency: Wound #1 Right,Medial Lower Leg: Change dressing every week Follow-up Appointments: Wound #1 Right,Medial Lower Leg: Return Appointment in 1 week. Edema Control: Wound #1 Right,Medial Lower Leg: 2 Layer Compression System - Right Lower Extremity Follow-Up Appointments: A follow-up appointment should be scheduled. A Patient Clinical Summary of Care was provided to [facility] Electronic Signature(s) Signed: 01/08/2015 5:11:41 PM By: Judene Companion MD  Signed: 01/13/2015 8:06:26 AM By: Christin Fudge MD, FACS Entered By: Judene Companion on 01/08/2015 17:08:43 Schreurs, [name] [name] -------------------------------------------------------------------------------- SuperBill Details Patient Name: Alcindor, Cellie C. Date of Service: 01/08/2015 Medical Record Number: [id number] Patient Account Number: [id number] Date of Birth/Sex: 10/19/1927 (81 y.o. Female) Treating RN: Montey Hora Primary Care Physician: Ria Bush Other Clinician: Referring Physician: Ria Bush Treating Physician/Extender: Frann Rider in Treatment: 20 Diagnosis Coding ICD-10 Codes Code Description E11.622 Type 2 diabetes mellitus with other skin ulcer E11.610 Type 2 diabetes mellitus with diabetic neuropathic arthropathy Chronic venous hypertension (idiopathic) with ulcer and inflammation of right lower I87.331 extremity L03.115 Cellulitis of right lower limb S41.112A Laceration without foreign body of left upper arm, initial encounter Facility Procedures CPT4 Code: [id number] Description: [id number] - DEB SUBQ TISSUE 20 SQ CM/< ICD-10 Description Diagnosis E11.622 Type 2 diabetes mellitus with other skin ulcer Modifier: Quantity: 1 Physician Procedures CPT4 Code: [id number] Description: [id number] - WC PHYS SUBQ  TISS 20 SQ CM ICD-10 Description Diagnosis E11.622 Type 2 diabetes mellitus with other skin ulcer Modifier: Quantity: 1 Electronic Signature(s) Signed: 01/08/2015 5:11:41 PM By: Judene Companion MD Signed: 01/13/2015 8:06:26 AM By: Christin Fudge MD, FACS Entered By: Judene Companion on 01/08/2015 17:08:58

## 2015-01-16 ENCOUNTER — Encounter: Payer: Medicare Other | Admitting: Surgery

## 2015-01-16 DIAGNOSIS — E11610 Type 2 diabetes mellitus with diabetic neuropathic arthropathy: Secondary | ICD-10-CM | POA: Diagnosis not present

## 2015-01-16 DIAGNOSIS — I87331 Chronic venous hypertension (idiopathic) with ulcer and inflammation of right lower extremity: Secondary | ICD-10-CM | POA: Diagnosis not present

## 2015-01-16 DIAGNOSIS — L97811 Non-pressure chronic ulcer of other part of right lower leg limited to breakdown of skin: Secondary | ICD-10-CM | POA: Diagnosis not present

## 2015-01-16 DIAGNOSIS — L03115 Cellulitis of right lower limb: Secondary | ICD-10-CM | POA: Diagnosis not present

## 2015-01-16 DIAGNOSIS — I10 Essential (primary) hypertension: Secondary | ICD-10-CM | POA: Diagnosis not present

## 2015-01-16 DIAGNOSIS — E11622 Type 2 diabetes mellitus with other skin ulcer: Secondary | ICD-10-CM | POA: Diagnosis not present

## 2015-01-16 DIAGNOSIS — L97211 Non-pressure chronic ulcer of right calf limited to breakdown of skin: Secondary | ICD-10-CM | POA: Diagnosis not present

## 2015-01-16 NOTE — Progress Notes (Addendum)
DAVEIGH, KIMBEL ([id number]) Visit Report for 01/16/2015 Chief Complaint Document Details Patient Name: [name], [name] Date of Service: 01/16/2015 2:30 PM Medical Record Number: [id number] Patient Account Number: [id number] Date of Birth/Sex: [birth year] (81 y.o. Female) Treating RN: Baruch Gouty, RN, BSN, Velva Harman Primary Care Physician: Ria Bush Other Clinician: Referring Physician: Ria Bush Treating Physician/Extender: Frann Rider in Treatment: 62 Information Obtained from: Patient Chief Complaint Patient presents for treatment of an open ulcer due to venous insufficiency. 81 year old patient who is known to me from the [facility] wound care center now comes in follow up here for a chronic venous ulceration of her right lower extremity which she's had for about 6 months. Electronic Signature(s) Signed: 01/16/2015 3:07:49 PM By: Christin Fudge MD, FACS Entered By: Christin Fudge on 01/16/2015 15:07:49 Saltz, [name] ([id number]) -------------------------------------------------------------------------------- HPI Details Patient Name: [name], [name] Date of Service: 01/16/2015 2:30 PM Medical Record Number: [id number] Patient Account Number: [id number] Date of Birth/Sex: 13-Feb-1928 (81 y.o. Female) Treating RN: Afful, RN, BSN, Velva Harman Primary Care Physician: Ria Bush Other Clinician: Referring Physician: Ria Bush Treating Physician/Extender: Frann Rider in Treatment: 22 History of Present Illness Location: ulcer in the right lower extremity for several months Quality: Patient reports experiencing a dull pain to affected area(s). Severity: Patient states wound (s) are getting better. Duration: Patient has had the wound for > 3 months prior to seeking treatment at the wound center Timing: Pain in wound is Intermittent (comes and goes Context: The wound appeared gradually over time Modifying Factors: Consults to this date include:  vascular surgery for the veins in December 2015 and she's been seeing the wound care center at [facility] for several months. Associated Signs and Symptoms: Patient reports having difficulty standing for long periods. HPI Description: This is an 81 yrs old female we have been following since the beginning of January for a wound on the right medial ankle. She had previously had venous ablation of the greater saphenous vein by Dr. Kellie Simmering. She came to [place] with erythema of the right leg and subsequently is opened in the medial aspect of the right ankle. She has not been very compliant with compression. We ordered her at juxta lite stocking, however she apparently has not been able to wear it. The area on the medial aspect of her left ankle has much less induration and erythema. The edema is better controlled. No evidence of infection is noted. 07/18/14; no major change in the condition of this wound. There is still surface slough and probably some drainage. 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 All other veins showed no evidence of DVT, superficial venous thrombosis or incompetence. however she does have manifestations of chronic venous hypertension and I believe we will change her dressing over to Prisma and a Profore light dressing. she sees her primary care doctor regularly and her last hemoglobin A1c has been around 7%.her other comorbidities are hypertension, CABG in 1998, peptic ulcer disease, history of echo with a ejection fraction of 55-60% and grade 1 diastolic dysfunction. past surgical history include coronary artery bypass graft in 1998, cholecystectomy, appendectomy, hand surgery, endovenous ablation of the saphenous vein with laser by Dr. Kellie Simmering in December 2015. 08/29/2014 - she complains of increased calf pain over the past week. removed her compression  bandage and pain improved. no fever or chills. Mild clear drainage. 09/04/2014 -- she has  not been compliant and has not done a dressing change as we had ordered last week. She always complains and does not keep her wrap on and this past week she has done the same. She has completed a course of doxycycline. 09/25/2014 -- she has tolerated her dressing all week and has not removed it. She says it's hurting a lot today and would not like any debridement. We will do so as per her wishes. she did get a appointment with the vascular surgeon and sees him on 6/28. LLASMIN, MACIK ([id number]) 10/03/2014 - She was seen by Dr. Tinnie Gens 09/30/2014. Assessment: I reviewed the previous venous duplex exam study performed in September 2015 which reveals no options for treating this venous ulcer. There is no significant superficial venous reflux to treat with laser ablation.Most recent ABI was 0.91 in the right foot with palpable dorsalis pedis pulse at 3+ Plan: patient needs to return to the [place] wound center and be treated by Dr. Con Memos. No arterial or venous options available from vascular standpoint or treatment of right leg stasis ulcer.Continued dressing changes and compression is the treatment of choice 10/16/2014 -- today will be her first application of Apligraf. 10/23/2014 -- we are going to be able to apply her second day of Apligraf next week. She is complaining of some tenderness in the region of Achilles tendon and this is possibly due to her wrap. 11/06/2014 -- she did not have Apligraf last week because the wound was not looking good but she says she is feeling much better now. 11/13/2014 -- she has been doing well this week and she is here for her second Apligraf. 11/27/2014 -- she is here for her third application of Apligraf 12/12/2014 -- she is here for her fourth application of Apligraf. Also during these past 3 days she had an injury to her left forearm which she caught  against the edge of her desk and has a lacerated wound which has gone down to the subcutaneous tissue. 12/26/2014 -- she is here for a last/fifth application of Apligraf. Electronic Signature(s) Signed: 01/16/2015 3:07:54 PM By: Christin Fudge MD, FACS Entered By: Christin Fudge on 01/16/2015 15:07:54 Arvie, [name] ([id number]) -------------------------------------------------------------------------------- Physical Exam Details Patient Name: Colonna, [name] Date of Service: 01/16/2015 2:30 PM Medical Record Number: [id number] Patient Account Number: [id number] Date of Birth/Sex: 1927/08/12 (81 y.o. Female) Treating RN: Baruch Gouty, RN, BSN, Velva Harman Primary Care Physician: Ria Bush Other Clinician: Referring Physician: Ria Bush Treating Physician/Extender: Frann Rider in Treatment: 22 Constitutional . Pulse regular. Respirations normal and unlabored. Afebrile. . Eyes Nonicteric. Reactive to light. Ears, Nose, Mouth, and Throat Lips, teeth, and gums WNL.Marland Kitchen Moist mucosa without lesions . Neck supple and nontender. No palpable supraclavicular or cervical adenopathy. Normal sized without goiter. Respiratory WNL. No retractions.. Cardiovascular Pedal Pulses WNL. No clubbing, cyanosis or edema. Lymphatic No adneopathy. No adenopathy. No adenopathy. Musculoskeletal Adexa without tenderness or enlargement.. Digits and nails w/o clubbing, cyanosis, infection, petechiae, ischemia, or inflammatory conditions.. Integumentary (Hair, Skin) No suspicious lesions. No crepitus or fluctuance. No peri-wound warmth or erythema. No masses.Marland Kitchen Psychiatric Judgement and insight Intact.. No evidence of depression, anxiety, or agitation.. Notes her wound is very clean and superficial and at this stage does not require any debridement. Her edema and varicosities continue to be prominent. Electronic Signature(s) Signed: 01/16/2015 3:08:29 PM By: Christin Fudge MD, FACS Entered By:  Christin Fudge on 01/16/2015 15:08:28 Brueggemann, [name] ([id number]) -------------------------------------------------------------------------------- Physician Orders Details Patient Name: [name], [name] Date  of Service: 01/16/2015 2:30 PM Medical Record Number: [id number] Patient Account Number: [id number] Date of Birth/Sex: 1928-03-20 (81 y.o. Female) Treating RN: Afful, RN, BSN, Velva Harman Primary Care Physician: Ria Bush Other Clinician: Referring Physician: Ria Bush Treating Physician/Extender: Frann Rider in Treatment: 1 Verbal / Phone Orders: Yes Clinician: Afful, RN, BSN, Rita Read Back and Verified: Yes Diagnosis Coding Wound Cleansing Wound #1 Right,Medial Lower Leg o Cleanse wound with mild soap and water Primary Wound Dressing Wound #1 Right,Medial Lower Leg o Other: - Sorbact Secondary Dressing Wound #1 Right,Medial Lower Leg o ABD pad o Drawtex Dressing Change Frequency Wound #1 Right,Medial Lower Leg o Change dressing every week Follow-up Appointments Wound #1 Right,Medial Lower Leg o Return Appointment in 1 week. Edema Control Wound #1 Right,Medial Lower Leg o 2 Layer Compression System - Right Lower Extremity Electronic Signature(s) Signed: 01/16/2015 3:13:13 PM By: Regan Lemming BSN, RN Signed: 01/16/2015 4:02:00 PM By: Christin Fudge MD, FACS Previous Signature: 01/16/2015 3:02:23 PM Version By: Regan Lemming BSN, RN Entered By: Regan Lemming on 01/16/2015 15:13:12 Odland, Ajwa C. ([id number]) -------------------------------------------------------------------------------- Problem List Details Patient Name: [name], [name] Date of Service: 01/16/2015 2:30 PM Medical Record Number: [id number] Patient Account Number: [id number] Date of Birth/Sex: [birth year] (81 y.o. Female) Treating RN: Baruch Gouty, RN, BSN, Velva Harman Primary Care Physician: Ria Bush Other Clinician: Referring Physician: Ria Bush Treating  Physician/Extender: Frann Rider in Treatment: 22 Active Problems ICD-10 Encounter Code Description Active Date Diagnosis E11.622 Type 2 diabetes mellitus with other skin ulcer 08/15/2014 Yes E11.610 Type 2 diabetes mellitus with diabetic neuropathic 08/15/2014 Yes arthropathy I87.331 Chronic venous hypertension (idiopathic) with ulcer and 08/15/2014 Yes inflammation of right lower extremity L03.115 Cellulitis of right lower limb 08/29/2014 Yes S41.112A Laceration without foreign body of left upper arm, initial 12/12/2014 Yes encounter Inactive Problems Resolved Problems Electronic Signature(s) Signed: 01/16/2015 3:07:41 PM By: Christin Fudge MD, FACS Entered By: Christin Fudge on 01/16/2015 15:07:41 Lafont, [name] ([id number]) -------------------------------------------------------------------------------- Progress Note Details Patient Name: [name], [name] Date of Service: 01/16/2015 2:30 PM Medical Record Number: [id number] Patient Account Number: [id number] Date of Birth/Sex: 1928-02-28 (81 y.o. Female) Treating RN: Baruch Gouty, RN, BSN, Velva Harman Primary Care Physician: Ria Bush Other Clinician: Referring Physician: Ria Bush Treating Physician/Extender: Frann Rider in Treatment: 32 Subjective Chief Complaint Information obtained from Patient Patient presents for treatment of an open ulcer due to venous insufficiency. 81 year old patient who is known to me from the [facility] wound care center now comes in follow up here for a chronic venous ulceration of her right lower extremity which she's had for about 6 months. History of Present Illness (HPI) The following HPI elements were documented for the patient's wound: Location: ulcer in the right lower extremity for several months Quality: Patient reports experiencing a dull pain to affected area(s). Severity: Patient states wound (s) are getting better. Duration: Patient has had the wound for > 3 months  prior to seeking treatment at the wound center Timing: Pain in wound is Intermittent (comes and goes Context: The wound appeared gradually over time Modifying Factors: Consults to this date include: vascular surgery for the veins in December 2015 and she's been seeing the wound care center at [facility] for several months. Associated Signs and Symptoms: Patient reports having difficulty standing for long periods. This is an 81 yrs old female we have been following since the beginning of January for a wound on the right medial ankle. She had previously had venous ablation of the greater saphenous vein by Dr. Kellie Simmering.  She came to [place] with erythema of the right leg and subsequently is opened in the medial aspect of the right ankle. She has not been very compliant with compression. We ordered her at juxta lite stocking, however she apparently has not been able to wear it. The area on the medial aspect of her left ankle has much less induration and erythema. The edema is better controlled. No evidence of infection is noted. 07/18/14; no major change in the condition of this wound. There is still surface slough and probably some drainage. 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 All other veins showed no evidence of DVT, superficial venous thrombosis or incompetence. however she does have manifestations of chronic venous hypertension and I believe we will change her dressing over to Prisma and a Profore light dressing. she sees her primary care doctor regularly and her last hemoglobin A1c has been around 7%.her other comorbidities are hypertension, CABG in 1998, peptic ulcer disease, history of echo with a ejection fraction of 55-60% and grade 1 diastolic dysfunction. past surgical history include coronary artery bypass graft in 1998, cholecystectomy, appendectomy, hand surgery,  endovenous ablation of the saphenous vein with laser by Dr. Kellie Simmering in December 2015. [name], [name] 08/29/2014 - she complains of increased calf pain over the past week. removed her compression bandage and pain improved. no fever or chills. Mild clear drainage. 09/04/2014 -- she has not been compliant and has not done a dressing change as we had ordered last week. She always complains and does not keep her wrap on and this past week she has done the same. She has completed a course of doxycycline. 09/25/2014 -- she has tolerated her dressing all week and has not removed it. She says it's hurting a lot today and would not like any debridement. We will do so as per her wishes. she did get a appointment with the vascular surgeon and sees him on 6/28. 10/03/2014 - She was seen by Dr. Tinnie Gens 09/30/2014. Assessment: I reviewed the previous venous duplex exam study performed in September 2015 which reveals no options for treating this venous ulcer. There is no significant superficial venous reflux to treat with laser ablation.Most recent ABI was 0.91 in the right foot with palpable dorsalis pedis pulse at 3+ Plan: patient needs to return to the [place] wound center and be treated by Dr. Con Memos. No arterial or venous options available from vascular standpoint or treatment of right leg stasis ulcer.Continued dressing changes and compression is the treatment of choice 10/16/2014 -- today will be her first application of Apligraf. 10/23/2014 -- we are going to be able to apply her second day of Apligraf next week. She is complaining of some tenderness in the region of Achilles tendon and this is possibly due to her wrap. 11/06/2014 -- she did not have Apligraf last week because the wound was not looking good but she says she is feeling much better now. 11/13/2014 -- she has been doing well this week and she is here for her second Apligraf. 11/27/2014 -- she is here for her third  application of Apligraf 12/12/2014 -- she is here for her fourth application of Apligraf. Also during these past 3 days she had an injury to her left forearm which she caught against the edge of her desk and has a lacerated wound which has gone down to  the subcutaneous tissue. 12/26/2014 -- she is here for a last/fifth application of Apligraf. Objective Constitutional Pulse regular. Respirations normal and unlabored. Afebrile. Vitals Time Taken: 2:44 PM, Height: 58 in, Weight: 151 lbs, BMI: 31.6, Temperature: 98.1 F, Pulse: 76 bpm, Respiratory Rate: 18 breaths/min, Blood Pressure: 129/87 mmHg. Eyes Nonicteric. Reactive to light. Ears, Nose, Mouth, and Throat Dehn, [name] Lips, teeth, and gums WNL.Marland Kitchen Moist mucosa without lesions . Neck supple and nontender. No palpable supraclavicular or cervical adenopathy. Normal sized without goiter. Respiratory WNL. No retractions.. Cardiovascular Pedal Pulses WNL. No clubbing, cyanosis or edema. Lymphatic No adneopathy. No adenopathy. No adenopathy. Musculoskeletal Adexa without tenderness or enlargement.. Digits and nails w/o clubbing, cyanosis, infection, petechiae, ischemia, or inflammatory conditions.Marland Kitchen Psychiatric Judgement and insight Intact.. No evidence of depression, anxiety, or agitation.. General Notes: her wound is very clean and superficial and at this stage does not require any debridement. Her edema and varicosities continue to be prominent. Integumentary (Hair, Skin) No suspicious lesions. No crepitus or fluctuance. No peri-wound warmth or erythema. No masses.. Wound #1 status is Open. Original cause of wound was Gradually Appeared. The wound is located on the Right,Medial Lower Leg. The wound measures 4.5cm length x 1.3cm width x 0.2cm depth; 4.595cm^2 area and 0.919cm^3 volume. The wound is limited to skin breakdown. There is no tunneling or undermining noted. There is a medium amount of serosanguineous  drainage noted. The wound margin is distinct with the outline attached to the wound base. There is large (67-100%) red granulation within the wound bed. There is no necrotic tissue within the wound bed. The periwound skin appearance exhibited: Moist, Mottled. The periwound skin appearance did not exhibit: Callus, Crepitus, Excoriation, Fluctuance, Friable, Induration, Localized Edema, Rash, Scarring, Dry/Scaly, Maceration, Atrophie Blanche, Cyanosis, Ecchymosis, Hemosiderin Staining, Pallor, Rubor, Erythema. Periwound temperature was noted as No Abnormality. The periwound has tenderness on palpation. Assessment Active Problems ICD-10 E11.622 - Type 2 diabetes mellitus with other skin ulcer E11.610 - Type 2 diabetes mellitus with diabetic neuropathic arthropathy I87.331 - Chronic venous hypertension (idiopathic) with ulcer and inflammation of right lower extremity [name], [name] L03.115 - Cellulitis of right lower limb S41.112A - Laceration without foreign body of left upper arm, initial encounter I have recommended Cutimed Sorbact on the wound followed by drawtex and a 2 layer compression wrap. I would like to use a 4-layer compression but the patient does not tolerate it. She will also need to see a podiatrist soon as she has several severe deformities of her foot and says a plantar callus is bothering her. I have also discussed the options of using diabetic footwear to be prescribed by her endocrinologist. Plan Wound Cleansing: Wound #1 Right,Medial Lower Leg: Cleanse wound with mild soap and water Primary Wound Dressing: Wound #1 Right,Medial Lower Leg: Other: - Sorbact Secondary Dressing: Wound #1 Right,Medial Lower Leg: ABD pad Drawtex Dressing Change Frequency: Wound #1 Right,Medial Lower Leg: Change dressing every week Follow-up Appointments: Wound #1 Right,Medial Lower Leg: Return Appointment in 1 week. Edema Control: Wound #1 Right,Medial Lower Leg: 2  Layer Compression System - Right Lower Extremity I have recommended Cutimed Sorbact on the wound followed by drawtex and a 2 layer compression wrap. I would like to use a 4-layer compression but the patient does not tolerate it. She will also need to see a podiatrist soon as she has several severe deformities of her foot and says a plantar callus is bothering her. I have also discussed the options of using diabetic  footwear to be prescribed by her endocrinologist. [name], [name] ([id number]) Electronic Signature(s) Signed: 01/16/2015 4:11:03 PM By: Christin Fudge MD, FACS Previous Signature: 01/16/2015 3:10:12 PM Version By: Christin Fudge MD, FACS Entered By: Christin Fudge on 01/16/2015 16:11:03 Rademaker, [name] ([id number]) -------------------------------------------------------------------------------- SuperBill Details Patient Name: Roznowski, [name] Date of Service: 01/16/2015 Medical Record Number: [id number] Patient Account Number: [id number] Date of Birth/Sex: [birth year] (81 y.o. Female) Treating RN: Afful, RN, BSN, Velva Harman Primary Care Physician: Ria Bush Other Clinician: Referring Physician: Ria Bush Treating Physician/Extender: Frann Rider in Treatment: 22 Diagnosis Coding ICD-10 Codes Code Description E11.622 Type 2 diabetes mellitus with other skin ulcer E11.610 Type 2 diabetes mellitus with diabetic neuropathic arthropathy Chronic venous hypertension (idiopathic) with ulcer and inflammation of right lower I87.331 extremity L03.115 Cellulitis of right lower limb S41.112A Laceration without foreign body of left upper arm, initial encounter Physician Procedures CPT4: Description Modifier Quantity Code [id number] - WC PHYS LEVEL 3 - EST PT 1 ICD-10 Description Diagnosis E11.622 Type 2 diabetes mellitus with other skin ulcer I87.331 Chronic venous hypertension (idiopathic) with ulcer and inflammation of right  lower extremity E11.610 Type 2  diabetes mellitus with diabetic neuropathic arthropathy Electronic Signature(s) Signed: 01/16/2015 3:10:35 PM By: Christin Fudge MD, FACS Entered By: Christin Fudge on 01/16/2015 15:10:35

## 2015-01-16 NOTE — Progress Notes (Signed)
DANYETTA, RIZZUTI ([id number]) Visit Report for 01/16/2015 Arrival Information Details Patient Name: Cadden, Taelor C. Date of Service: 01/16/2015 2:30 PM Medical Record Number: [id number] Patient Account Number: [id number] Date of Birth/Sex: 1927-08-26 (81 y.o. Female) Treating RN: Afful, RN, BSN, Velva Harman Primary Care Physician: Ria Bush Other Clinician: Referring Physician: Ria Bush Treating Physician/Extender: Frann Rider in Treatment: 32 Visit Information History Since Last Visit Added or deleted any medications: No Patient Arrived: Walker Any new allergies or adverse reactions: No Arrival Time: 14:34 Had a fall or experienced change in No Accompanied By: self activities of daily living that may affect Transfer Assistance: None risk of falls: Patient Identification Verified: Yes Signs or symptoms of abuse/neglect since last No Secondary Verification Process Yes visito Completed: Hospitalized since last visit: No Patient Has Alerts: Yes Has Dressing in Place as Prescribed: Yes Patient Alerts: Patient on Blood Has Compression in Place as Prescribed: Yes Thinner Pain Present Now: Yes Type II Diabetic 81 MG aspirin Electronic Signature(s) Signed: 01/16/2015 2:40:52 PM By: Regan Lemming BSN, RN Previous Signature: 01/16/2015 2:35:07 PM Version By: Regan Lemming BSN, RN Entered By: Regan Lemming on 01/16/2015 14:40:52 Cleavenger, [name] ([id number]) -------------------------------------------------------------------------------- Encounter Discharge Information Details Patient Name: Preis, Jo-Anne C. Date of Service: 01/16/2015 2:30 PM Medical Record Number: [id number] Patient Account Number: [id number] Date of Birth/Sex: 1928/01/02 (81 y.o. Female) Treating RN: Baruch Gouty, RN, BSN, Velva Harman Primary Care Physician: Ria Bush Other Clinician: Referring Physician: Ria Bush Treating Physician/Extender: Frann Rider in Treatment: 43 Encounter  Discharge Information Items Discharge Pain Level: 5 Discharge Condition: Stable Ambulatory Status: Walker Discharge Destination: Home Private Transportation: Auto son in [place] By: lobby Schedule Follow-up Appointment: No Medication Reconciliation completed and No provided to Patient/Care Evadene Wardrip: Clinical Summary of Care: Electronic Signature(s) Signed: 01/16/2015 3:17:12 PM By: Regan Lemming BSN, RN Entered By: Regan Lemming on 01/16/2015 15:17:12 Pangilinan, [name] ([id number]) -------------------------------------------------------------------------------- Lower Extremity Assessment Details Patient Name: Mroczka, Nikayla C. Date of Service: 01/16/2015 2:30 PM Medical Record Number: [id number] Patient Account Number: [id number] Date of Birth/Sex: 04-14-1927 (81 y.o. Female) Treating RN: Afful, RN, BSN, Velva Harman Primary Care Physician: Ria Bush Other Clinician: Referring Physician: Ria Bush Treating Physician/Extender: Frann Rider in Treatment: 22 Edema Assessment Assessed: [Left: No] [Right: No] E[Left: dema] [Right: :] Calf Left: Right: Point of Measurement: 27 cm From Medial Instep cm 26.4 cm Ankle Left: Right: Point of Measurement: 10 cm From Medial Instep cm 20.2 cm Vascular Assessment Pulses: Posterior Tibial Dorsalis Pedis Palpable: [Right:Yes] Extremity colors, hair growth, and conditions: Extremity Color: [Right:Mottled] Hair Growth on Extremity: [Right:No] Temperature of Extremity: [Right:Warm] Capillary Refill: [Right:< 3 seconds] Toe Nail Assessment Left: Right: Thick: Yes Discolored: Yes Deformed: Yes Improper Length and Hygiene: No Electronic Signature(s) Signed: 01/16/2015 2:42:31 PM By: Regan Lemming BSN, RN Entered By: Regan Lemming on 01/16/2015 14:42:31 Krapf, [name] ([id number]) -------------------------------------------------------------------------------- Multi Wound Chart Details Patient Name: Neidhardt, Bri  C. Date of Service: 01/16/2015 2:30 PM Medical Record Number: [id number] Patient Account Number: [id number] Date of Birth/Sex: May 02, 1927 (81 y.o. Female) Treating RN: Baruch Gouty, RN, BSN, Velva Harman Primary Care Physician: Ria Bush Other Clinician: Referring Physician: Ria Bush Treating Physician/Extender: Frann Rider in Treatment: 22 Vital Signs Height(in): 58 Pulse(bpm): 76 Weight(lbs): 151 Blood Pressure 129/87 (mmHg): Body Mass Index(BMI): 32 Temperature(F): 98.1 Respiratory Rate 18 (breaths/min): Photos: [1:No Photos] [N/A:N/A] Wound Location: [1:Right Lower Leg - Medial] [N/A:N/A] Wounding Event: [1:Gradually Appeared] [N/A:N/A] Primary Etiology: [1:Venous Leg Ulcer] [N/A:N/A] Comorbid History: [1:Cataracts, Anemia, Coronary Artery Disease, Hypertension, Myocardial Infarction, Type  II Diabetes, Osteoarthritis] [N/A:N/A] Date Acquired: [1:04/04/2014] [N/A:N/A] Weeks of Treatment: [1:22] [N/A:N/A] Wound Status: [1:Open] [N/A:N/A] Measurements L x W x D 4.5x1.3x0.2 [N/A:N/A] (cm) Area (cm) : [1:4.595] [N/A:N/A] Volume (cm) : [1:0.919] [N/A:N/A] % Reduction in Area: [1:22.00%] [N/A:N/A] % Reduction in Volume: 48.00% [N/A:N/A] Classification: [1:Full Thickness Without Exposed Support Structures] [N/A:N/A] HBO Classification: [1:Grade 1] [N/A:N/A] Exudate Amount: [1:Medium] [N/A:N/A] Exudate Type: [1:Serosanguineous] [N/A:N/A] Exudate Color: [1:red, brown] [N/A:N/A] Foul Odor After [1:Yes] [N/A:N/A] Cleansing: Odor Anticipated Due to No [N/A:N/A] Product Use: Wound Margin: [1:Distinct, outline attached] [N/A:N/A] Granulation Amount: Large (67-100%) N/A N/A Granulation Quality: Red N/A N/A Necrotic Amount: None Present (0%) N/A N/A Exposed Structures: Fascia: No N/A N/A Fat: No Tendon: No Muscle: No Joint: No Bone: No Limited to Skin Breakdown Epithelialization: Small (1-33%) N/A N/A Periwound Skin Texture: Edema: No N/A N/A Excoriation:  No Induration: No Callus: No Crepitus: No Fluctuance: No Friable: No Rash: No Scarring: No Periwound Skin Moist: Yes N/A N/A Moisture: Maceration: No Dry/Scaly: No Periwound Skin Color: Mottled: Yes N/A N/A Atrophie Blanche: No Cyanosis: No Ecchymosis: No Erythema: No Hemosiderin Staining: No Pallor: No Rubor: No Temperature: No Abnormality N/A N/A Tenderness on Yes N/A N/A Palpation: Wound Preparation: Ulcer Cleansing: Other: N/A N/A soap and water Topical Anesthetic Applied: Other: lidocaine 4% Treatment Notes Electronic Signature(s) Signed: 01/16/2015 3:01:29 PM By: Regan Lemming BSN, RN Entered By: Regan Lemming on 01/16/2015 15:01:29 Bumgardner, [name] ([id number]) -------------------------------------------------------------------------------- [place] Details Patient Name: Urieta, Lina C. Date of Service: 01/16/2015 2:30 PM Medical Record Number: [id number] Patient Account Number: [id number] Date of Birth/Sex: 1927-09-08 (81 y.o. Female) Treating RN: Afful, RN, BSN, Velva Harman Primary Care Physician: Ria Bush Other Clinician: Referring Physician: Ria Bush Treating Physician/Extender: Frann Rider in Treatment: 79 Active Inactive Abuse / Safety / Falls / Self Care Management Nursing Diagnoses: Potential for falls Goals: Patient will remain injury free Date Initiated: 08/15/2014 Goal Status: Active Interventions: Assess fall risk on admission and as needed Notes: Nutrition Nursing Diagnoses: Impaired glucose control: actual or potential Goals: Patient/caregiver verbalizes understanding of need to maintain therapeutic glucose control per primary care physician Date Initiated: 08/15/2014 Goal Status: Active Interventions: Assess patient nutrition upon admission and as needed per policy Notes: Orientation to the Wound Care Program Nursing Diagnoses: Knowledge deficit related to the wound healing center  program Goals: Patient/caregiver will verbalize understanding of the [place], Jency C. ([id number]) Date Initiated: 08/15/2014 Goal Status: Active Interventions: Provide education on orientation to the wound center Notes: Venous Leg Ulcer Nursing Diagnoses: Actual venous Insuffiency (use after diagnosis is confirmed) Goals: Patient will maintain optimal edema control Date Initiated: 08/15/2014 Goal Status: Active Interventions: Compression as ordered Treatment Activities: Test ordered outside of clinic : 01/16/2015 Notes: Wound/Skin Impairment Nursing Diagnoses: Impaired tissue integrity Goals: Patient/caregiver will verbalize understanding of skin care regimen Date Initiated: 08/15/2014 Goal Status: Active Interventions: Assess patient/caregiver ability to obtain necessary supplies Notes: Electronic Signature(s) Signed: 01/16/2015 3:01:21 PM By: Regan Lemming BSN, RN Entered By: Regan Lemming on 01/16/2015 15:01:21 Catterton, Ermina C. ([id number]) -------------------------------------------------------------------------------- Pain Assessment Details Patient Name: Laine, Aniya C. Date of Service: 01/16/2015 2:30 PM Medical Record Number: [id number] Patient Account Number: [id number] Date of Birth/Sex: 1927/07/29 (81 y.o. Female) Treating RN: Baruch Gouty, RN, BSN, Velva Harman Primary Care Physician: Ria Bush Other Clinician: Referring Physician: Ria Bush Treating Physician/Extender: Frann Rider in Treatment: 22 Active Problems Location of Pain Severity and Description of Pain Patient Has [name] Yes Site Locations Pain Location: Generalized Pain, Pain  in Ulcers Rate the pain. Current Pain Level: 5 Character of Pain Describe the Pain: Aching, Tender Pain Management and Medication Current Pain Management: How does your pain impact your activities of daily livingo Sleep: Yes Bathing: Yes Appetite: Yes Relationship With Others:  Yes Bladder Continence: Yes Emotions: Yes Bowel Continence: Yes Work: Yes Toileting: Yes Drive: Yes Dressing: Yes Hobbies: Yes Electronic Signature(s) Signed: 01/16/2015 2:40:42 PM By: Regan Lemming BSN, RN Entered By: Regan Lemming on 01/16/2015 14:40:41 Hodgens, [name] ([id number]) -------------------------------------------------------------------------------- Patient/Caregiver Education Details Patient Name: Isa, Naveah C. Date of Service: 01/16/2015 2:30 PM Medical Record Number: [id number] Patient Account Number: [id number] Date of Birth/Gender: 1928-03-07 (81 y.o. Female) Treating RN: Baruch Gouty, RN, BSN, Velva Harman Primary Care Physician: Ria Bush Other Clinician: Referring Physician: Ria Bush Treating Physician/Extender: Frann Rider in Treatment: 22 Education Assessment Education Provided To: Patient Education Topics Provided Basic Hygiene: Methods: Explain/Verbal Responses: State content correctly Welcome To [facility] [place]: Methods: Explain/Verbal Responses: State content correctly Electronic Signature(s) Signed: 01/16/2015 3:17:25 PM By: Regan Lemming BSN, RN Entered By: Regan Lemming on 01/16/2015 15:17:25 Fuster, [name] ([id number]) -------------------------------------------------------------------------------- Wound Assessment Details Patient Name: Calica, Bernardine C. Date of Service: 01/16/2015 2:30 PM Medical Record Number: [id number] Patient Account Number: [id number] Date of Birth/Sex: [birth year] (81 y.o. Female) Treating RN: Afful, RN, BSN, Velva Harman Primary Care Physician: Ria Bush Other Clinician: Referring Physician: Ria Bush Treating Physician/Extender: Frann Rider in Treatment: 22 Wound Status Wound Number: 1 Primary Venous Leg Ulcer Etiology: Wound Location: Right Lower Leg - Medial Wound Open Wounding Event: Gradually Appeared Status: Date Acquired: 04/04/2014 Comorbid Cataracts, Anemia, Coronary  Artery Weeks Of Treatment: 22 History: Disease, Hypertension, Myocardial Clustered Wound: No Infarction, Type II Diabetes, Osteoarthritis Photos Photo Uploaded By: Regan Lemming on 01/16/2015 15:29:23 Wound Measurements Length: (cm) 4.5 Width: (cm) 1.3 Depth: (cm) 0.2 Area: (cm) 4.595 Volume: (cm) 0.919 % Reduction in Area: 22% % Reduction in Volume: 48% Epithelialization: Small (1-33%) Tunneling: No Undermining: No Wound Description Full Thickness Without Exposed Foul Odor A Classification: Support Structures Due to Prod Diabetic Severity Grade 1 (Wagner): Wound Margin: Distinct, outline attached Exudate Amount: Medium Exudate Type: Serosanguineous Exudate Color: red, brown fter Cleansing: Yes uct Use: No Wound Bed Bagshaw, Evola C. ([id number]) Granulation Amount: Large (67-100%) Exposed Structure Granulation Quality: Red Fascia Exposed: No Necrotic Amount: None Present (0%) Fat Layer Exposed: No Tendon Exposed: No Muscle Exposed: No Joint Exposed: No Bone Exposed: No Limited to Skin Breakdown Periwound Skin Texture Texture Color No Abnormalities Noted: No No Abnormalities Noted: No Callus: No Atrophie Blanche: No Crepitus: No Cyanosis: No Excoriation: No Ecchymosis: No Fluctuance: No Erythema: No Friable: No Hemosiderin Staining: No Induration: No Mottled: Yes Localized Edema: No Pallor: No Rash: No Rubor: No Scarring: No Temperature / Pain Moisture Temperature: No Abnormality No Abnormalities Noted: No Tenderness on Palpation: Yes Dry / Scaly: No Maceration: No Moist: Yes Wound Preparation Ulcer Cleansing: Other: soap and water, Topical Anesthetic Applied: Other: lidocaine 4%, Treatment Notes Wound #1 (Right, Medial Lower Leg) 1. Cleansed with: Cleanse wound with antibacterial soap and water 3. Peri-wound Care: Moisturizing lotion 4. Dressing Applied: Other dressing (specify in notes) 7. Secured with 2 Layer Compression System  - Right Lower Extremity Notes Sorbact,drawtex, abd pad Electronic Signature(s) Signed: 01/16/2015 3:01:13 PM By: Regan Lemming BSN, RN Furuya, [name] ([id number]) Entered By: Regan Lemming on 01/16/2015 15:01:12 Purves, [name] ([id number]) -------------------------------------------------------------------------------- Vitals Details Patient Name: Mayr, Anyela C. Date of Service: 01/16/2015 2:30 PM Medical Record Number: [id number]  Patient Account Number: [id number] Date of Birth/Sex: 05-21-1927 (81 y.o. Female) Treating RN: Afful, RN, BSN, Velva Harman Primary Care Physician: Ria Bush Other Clinician: Referring Physician: Ria Bush Treating Physician/Extender: Frann Rider in Treatment: 22 Vital Signs Time Taken: 14:44 Temperature (F): 98.1 Height (in): 58 Pulse (bpm): 76 Weight (lbs): 151 Respiratory Rate (breaths/min): 18 Body Mass Index (BMI): 31.6 Blood Pressure (mmHg): 129/87 Reference Range: 80 - 120 mg / dl Electronic Signature(s) Signed: 01/16/2015 2:44:30 PM By: Regan Lemming BSN, RN Entered By: Regan Lemming on 01/16/2015 14:44:30

## 2015-01-19 ENCOUNTER — Other Ambulatory Visit: Payer: Self-pay | Admitting: Family Medicine

## 2015-01-20 ENCOUNTER — Other Ambulatory Visit: Payer: Self-pay | Admitting: Family Medicine

## 2015-01-23 ENCOUNTER — Encounter: Payer: Self-pay | Admitting: Family Medicine

## 2015-01-23 ENCOUNTER — Encounter (HOSPITAL_BASED_OUTPATIENT_CLINIC_OR_DEPARTMENT_OTHER): Payer: Medicare Other | Admitting: General Surgery

## 2015-01-23 ENCOUNTER — Ambulatory Visit (INDEPENDENT_AMBULATORY_CARE_PROVIDER_SITE_OTHER): Payer: Medicare Other | Admitting: Family Medicine

## 2015-01-23 VITALS — BP 144/70 | HR 76 | Temp 97.9°F | Ht <= 58 in | Wt 152.2 lb

## 2015-01-23 DIAGNOSIS — R531 Weakness: Secondary | ICD-10-CM

## 2015-01-23 DIAGNOSIS — L03115 Cellulitis of right lower limb: Secondary | ICD-10-CM | POA: Diagnosis not present

## 2015-01-23 DIAGNOSIS — L97811 Non-pressure chronic ulcer of other part of right lower leg limited to breakdown of skin: Secondary | ICD-10-CM | POA: Diagnosis not present

## 2015-01-23 DIAGNOSIS — I8311 Varicose veins of right lower extremity with inflammation: Secondary | ICD-10-CM | POA: Insufficient documentation

## 2015-01-23 DIAGNOSIS — L97219 Non-pressure chronic ulcer of right calf with unspecified severity: Principal | ICD-10-CM

## 2015-01-23 DIAGNOSIS — I83212 Varicose veins of right lower extremity with both ulcer of calf and inflammation: Secondary | ICD-10-CM

## 2015-01-23 DIAGNOSIS — I2510 Atherosclerotic heart disease of native coronary artery without angina pectoris: Secondary | ICD-10-CM

## 2015-01-23 DIAGNOSIS — E11622 Type 2 diabetes mellitus with other skin ulcer: Secondary | ICD-10-CM | POA: Diagnosis not present

## 2015-01-23 DIAGNOSIS — I10 Essential (primary) hypertension: Secondary | ICD-10-CM | POA: Diagnosis not present

## 2015-01-23 DIAGNOSIS — E11610 Type 2 diabetes mellitus with diabetic neuropathic arthropathy: Secondary | ICD-10-CM | POA: Diagnosis not present

## 2015-01-23 DIAGNOSIS — I959 Hypotension, unspecified: Secondary | ICD-10-CM

## 2015-01-23 DIAGNOSIS — L97211 Non-pressure chronic ulcer of right calf limited to breakdown of skin: Secondary | ICD-10-CM | POA: Diagnosis not present

## 2015-01-23 DIAGNOSIS — I87331 Chronic venous hypertension (idiopathic) with ulcer and inflammation of right lower extremity: Secondary | ICD-10-CM | POA: Diagnosis not present

## 2015-01-23 LAB — COMPREHENSIVE METABOLIC PANEL
ALK PHOS: 116 U/L (ref 33–130)
ALT: 14 U/L (ref 6–29)
AST: 18 U/L (ref 10–35)
Albumin: 3.4 g/dL — ABNORMAL LOW (ref 3.6–5.1)
BUN: 22 mg/dL (ref 7–25)
CHLORIDE: 102 mmol/L (ref 98–110)
CO2: 30 mmol/L (ref 20–31)
Calcium: 9.1 mg/dL (ref 8.6–10.4)
Creat: 0.76 mg/dL (ref 0.60–0.88)
GLUCOSE: 115 mg/dL — AB (ref 65–99)
POTASSIUM: 4.5 mmol/L (ref 3.5–5.3)
Sodium: 139 mmol/L (ref 135–146)
Total Bilirubin: 0.5 mg/dL (ref 0.2–1.2)
Total Protein: 6.7 g/dL (ref 6.1–8.1)

## 2015-01-23 LAB — CBC
HCT: 38.7 % (ref 36.0–46.0)
Hemoglobin: 12.6 g/dL (ref 12.0–15.0)
MCH: 28.9 pg (ref 26.0–34.0)
MCHC: 32.6 g/dL (ref 30.0–36.0)
MCV: 88.8 fL (ref 78.0–100.0)
MPV: 9.5 fL (ref 8.6–12.4)
PLATELETS: 304 10*3/uL (ref 150–400)
RBC: 4.36 MIL/uL (ref 3.87–5.11)
RDW: 14 % (ref 11.5–15.5)
WBC: 8.8 10*3/uL (ref 4.0–10.5)

## 2015-01-23 NOTE — Assessment & Plan Note (Signed)
No current evidence of infection, blood loss etc.. Eval with labs.

## 2015-01-23 NOTE — Assessment & Plan Note (Signed)
Resolved.  transient hypotension at wound clinic. No evidence that BP was re-verified.  ? Due to mild dehydration, pt appears chronically depressed and has decreased po intake, decrease liquid intake. Bp elevated now... Would take low dose ramipril as usual.

## 2015-01-23 NOTE — Progress Notes (Signed)
   Subjective:    [name], female    DOB: [birth year], 81 y.o.   MRN: [id number]  HPI  81 year old female patient of Dr. Darnell Level with history of  CAD, HTN, diastolic dysfunction, DM2 currently being treated for venous ulcer at wound center who reports low BP 90/64 at wound center today.   She is on ramipril  5 mg daily.  She has not taken yet today.  She has been feeling weak. No dizziness, no N/V/D.  Decreased po intake, decrease  water intake, decreased appetite. No fever. mild cold symptoms ( [place]e bit sneezing) . No urinary symptoms.  BP Readings from Last 3 Encounters:  01/23/15 144/70  11/12/14 126/70  10/31/14 130/62   Hx of anemia,  No current blood loss.    Review of Systems  Constitutional: Negative for fever and fatigue.  HENT: Negative for ear pain.   Eyes: Negative for pain.  Respiratory: Negative for chest tightness and shortness of breath.   Cardiovascular: Negative for chest pain, palpitations and leg swelling.  Gastrointestinal: Negative for abdominal pain.  Genitourinary: Negative for dysuria.       Objective:   Physical Exam  Constitutional: Vital signs are normal. She appears well-developed and well-nourished. She is cooperative.  Non-toxic appearance. She does not appear ill. No distress.  Elderly female in NAD  HENT:  Head: Normocephalic.  Right Ear: Hearing, tympanic membrane, external ear and ear canal normal. Tympanic membrane is not erythematous, not retracted and not bulging.  Left Ear: Hearing, tympanic membrane, external ear and ear canal normal. Tympanic membrane is not erythematous, not retracted and not bulging.  Nose: No mucosal edema or rhinorrhea. Right sinus exhibits no maxillary sinus tenderness and no frontal sinus tenderness. Left sinus exhibits no maxillary sinus tenderness and no frontal sinus tenderness.  Mouth/Throat: Uvula is midline, oropharynx is clear and moist and mucous membranes are normal.  Eyes: Conjunctivae,  EOM and lids are normal. Pupils are equal, round, and reactive to light. Lids are everted and swept, no foreign bodies found.  Neck: Trachea normal and normal range of motion. Neck supple. Carotid bruit is not present. No thyroid mass and no thyromegaly present.  Cardiovascular: Normal rate, regular rhythm, S1 normal, S2 normal, normal heart sounds, intact distal pulses and normal pulses.  Exam reveals no gallop and no friction rub.   No murmur heard. Pulmonary/Chest: Effort normal and breath sounds normal. No tachypnea. No respiratory distress. She has no decreased breath sounds. She has no wheezes. She has no rhonchi. She has no rales.  Abdominal: Soft. Normal appearance and bowel sounds are normal. There is no tenderness.  Neurological: She is alert.  Skin: Skin is warm, dry and intact. No rash noted.  Swelling and ulcer right leg, bandage not removed.  Psychiatric: Her speech is normal and behavior is normal. Judgment and thought content normal. Her mood appears not anxious. Cognition and memory are normal. She does not exhibit a depressed mood.          Assessment & Plan:

## 2015-01-23 NOTE — Progress Notes (Signed)
seeiheal 

## 2015-01-23 NOTE — Progress Notes (Signed)
Pre visit review using our clinic review tool, if applicable. No additional management support is needed unless otherwise documented below in the visit note. 

## 2015-01-23 NOTE — Patient Instructions (Addendum)
Stop by lab on way out.  Push fluids, rest.  If not improving next week follow up with PCP.

## 2015-01-26 NOTE — Progress Notes (Signed)
KOBEE, SIKKEMA ([id number]) Visit Report for 01/23/2015 Arrival Information Details Patient Name: [name], [name] Date of Service: 01/23/2015 1:00 PM Medical Record Number: [id number] Patient Account Number: [id number] Date of Birth/Sex: 01-Oct-1927 (81 y.o. Female) Treating RN: Cornell Barman Primary Care Physician: Ria Bush Other Clinician: Referring Physician: Ria Bush Treating Physician/Extender: Frann Rider in Treatment: 23 Visit Information History Since Last Visit Added or deleted any medications: No Patient Arrived: Walker Any new allergies or adverse reactions: No Arrival Time: 13:05 Had a fall or experienced change in No Accompanied By: son in lobby activities of daily living that may affect Transfer Assistance: None risk of falls: Patient Identification Verified: Yes Signs or symptoms of abuse/neglect since last No Secondary Verification Process Yes visito Completed: Hospitalized since last visit: No Patient Has Alerts: Yes Has Dressing in Place as Prescribed: Yes Patient Alerts: Patient on Blood Pain Present Now: No Thinner Type II Diabetic 81 MG aspirin Electronic Signature(s) Signed: 01/23/2015 3:34:06 PM By: Gretta Cool, RN, BSN, Kim RN, BSN Entered By: Gretta Cool, RN, BSN, Kim on 01/23/2015 13:06:12 [name], [name] ([id number]) -------------------------------------------------------------------------------- Encounter Discharge Information Details Patient Name: [name], [name] Date of Service: 01/23/2015 1:00 PM Medical Record Number: [id number] Patient Account Number: [id number] Date of Birth/Sex: 1927/10/22 (81 y.o. Female) Treating RN: Montey Hora Primary Care Physician: Ria Bush Other Clinician: Referring Physician: Ria Bush Treating Physician/Extender: Frann Rider in Treatment: 23 Encounter Discharge Information Items Discharge Pain Level: 0 Discharge Condition: Stable Ambulatory Status:  Walker Discharge Destination: Home Transportation: Private Auto Accompanied By: self Schedule Follow-up Appointment: Yes Medication Reconciliation completed and provided to Patient/Care No Tabbetha Kutscher: Provided on Clinical Summary of Care: 01/23/2015 Form Type Recipient Paper Patient LL Electronic Signature(s) Signed: 01/23/2015 1:31:02 PM By: Ruthine Dose Previous Signature: 01/23/2015 1:29:16 PM Version By: Judene Companion MD Entered By: Ruthine Dose on 01/23/2015 13:31:02 [name], [name] ([id number]) -------------------------------------------------------------------------------- Lower Extremity Assessment Details Patient Name: [name], [name] Date of Service: 01/23/2015 1:00 PM Medical Record Number: [id number] Patient Account Number: [id number] Date of Birth/Sex: Nov 30, 1927 (81 y.o. Female) Treating RN: Cornell Barman Primary Care Physician: Ria Bush Other Clinician: Referring Physician: Ria Bush Treating Physician/Extender: Frann Rider in Treatment: 23 Edema Assessment Assessed: [Left: No] [Right: No] E[Left: dema] [Right: :] Calf Left: Right: Point of Measurement: 27 cm From Medial Instep cm 26.8 cm Ankle Left: Right: Point of Measurement: 10 cm From Medial Instep cm 20.5 cm Vascular Assessment Pulses: Posterior Tibial Dorsalis Pedis Palpable: [Right:Yes] Doppler: [Right:Multiphasic] Extremity colors, hair growth, and conditions: Extremity Color: [Right:Mottled] Hair Growth on Extremity: [Right:No] Temperature of Extremity: [Right:Warm] Capillary Refill: [Right:< 3 seconds] Toe Nail Assessment Left: Right: Thick: No Discolored: No Deformed: No Improper Length and Hygiene: No Electronic Signature(s) Signed: 01/23/2015 3:34:06 PM By: Gretta Cool, RN, BSN, Kim RN, BSN Entered By: Gretta Cool, RN, BSN, Kim on 01/23/2015 13:12:20 [name], [name] ([id number]) Horen, Ayme Loletha Grayer  ([id number]) -------------------------------------------------------------------------------- Multi Wound Chart Details Patient Name: [name], [name] Date of Service: 01/23/2015 1:00 PM Medical Record Number: [id number] Patient Account Number: [id number] Date of Birth/Sex: 10-Oct-1927 (81 y.o. Female) Treating RN: Montey Hora Primary Care Physician: Ria Bush Other Clinician: Referring Physician: Ria Bush Treating Physician/Extender: Frann Rider in Treatment: 23 Vital Signs Height(in): 58 Pulse(bpm): 86 Weight(lbs): 151 Blood Pressure 90/64 (mmHg): Body Mass Index(BMI): 32 Temperature(F): 98.1 Respiratory Rate 18 (breaths/min): Photos: [1:No Photos] [N/A:N/A] Wound Location: [1:Right Lower Leg - Medial] [N/A:N/A] Wounding Event: [1:Gradually Appeared] [N/A:N/A] Primary Etiology: [1:Venous Leg Ulcer] [N/A:N/A] Comorbid History: [1:Cataracts, Anemia, Coronary Artery  Disease, Hypertension, Myocardial Infarction, Type II Diabetes, Osteoarthritis] [N/A:N/A] Date Acquired: [1:04/04/2014] [N/A:N/A] Weeks of Treatment: [1:23] [N/A:N/A] Wound Status: [1:Open] [N/A:N/A] Measurements L x W x D 4x1.2x0.1 [N/A:N/A] (cm) Area (cm) : [1:3.77] [N/A:N/A] Volume (cm) : [1:0.377] [N/A:N/A] % Reduction in Area: [1:36.00%] [N/A:N/A] % Reduction in Volume: 78.70% [N/A:N/A] Classification: [1:Full Thickness Without Exposed Support Structures] [N/A:N/A] HBO Classification: [1:Grade 1] [N/A:N/A] Exudate Amount: [1:Medium] [N/A:N/A] Exudate Type: [1:Serosanguineous] [N/A:N/A] Exudate Color: [1:red, brown] [N/A:N/A] Foul Odor After [1:Yes] [N/A:N/A] Cleansing: Odor Anticipated Due to No [N/A:N/A] Product Use: Wound Margin: [1:[name], [name]] [N/A:N/A] Granulation Amount: Large (67-100%) N/A N/A Granulation Quality: Red N/A N/A Necrotic Amount: None Present (0%) N/A N/A Exposed Structures: Fascia: No N/A N/A Fat: No Tendon: No Muscle: No Joint:  No Bone: No Limited to Skin Breakdown Epithelialization: Small (1-33%) N/A N/A Periwound Skin Texture: Edema: No N/A N/A Excoriation: No Induration: No Callus: No Crepitus: No Fluctuance: No Friable: No Rash: No Scarring: No Periwound Skin Moist: Yes N/A N/A Moisture: Maceration: No Dry/Scaly: No Periwound Skin Color: Mottled: Yes N/A N/A Atrophie Blanche: No Cyanosis: No Ecchymosis: No Erythema: No Hemosiderin Staining: No Pallor: No Rubor: No Temperature: No Abnormality N/A N/A Tenderness on Yes N/A N/A Palpation: Wound Preparation: Ulcer Cleansing: Other: N/A N/A soap and water Topical Anesthetic Applied: Other: lidocaine 4% Treatment Notes Electronic Signature(s) Signed: 01/23/2015 3:16:02 PM By: Montey Hora Entered By: Montey Hora on 01/23/2015 13:21:40 Pizzimenti, [name] ([id number]) -------------------------------------------------------------------------------- [place] Details Patient Name: [name], [name] Date of Service: 01/23/2015 1:00 PM Medical Record Number: [id number] Patient Account Number: [id number] Date of Birth/Sex: [birth year] (81 y.o. Female) Treating RN: Montey Hora Primary Care Physician: Ria Bush Other Clinician: Referring Physician: Ria Bush Treating Physician/Extender: Frann Rider in Treatment: 45 Active Inactive Abuse / Safety / Falls / Self Care Management Nursing Diagnoses: Potential for falls Goals: Patient will remain injury free Date Initiated: 08/15/2014 Goal Status: Active Interventions: Assess fall risk on admission and as needed Notes: Nutrition Nursing Diagnoses: Impaired glucose control: actual or potential Goals: Patient/caregiver verbalizes understanding of need to maintain therapeutic glucose control per primary care physician Date Initiated: 08/15/2014 Goal Status: Active Interventions: Assess patient nutrition upon admission and as needed per  policy Notes: Orientation to the Wound Care Program Nursing Diagnoses: Knowledge deficit related to the wound healing center program Goals: Patient/caregiver will verbalize understanding of the [place], Kainat C. ([id number]) Date Initiated: 08/15/2014 Goal Status: Active Interventions: Provide education on orientation to the wound center Notes: Venous Leg Ulcer Nursing Diagnoses: Actual venous Insuffiency (use after diagnosis is confirmed) Goals: Patient will maintain optimal edema control Date Initiated: 08/15/2014 Goal Status: Active Interventions: Compression as ordered Treatment Activities: Test ordered outside of clinic : 01/23/2015 Notes: Wound/Skin Impairment Nursing Diagnoses: Impaired tissue integrity Goals: Patient/caregiver will verbalize understanding of skin care regimen Date Initiated: 08/15/2014 Goal Status: Active Interventions: Assess patient/caregiver ability to obtain necessary supplies Notes: Electronic Signature(s) Signed: 01/23/2015 3:16:02 PM By: Montey Hora Entered By: Montey Hora on 01/23/2015 13:21:31 Callan, [name] ([id number]) -------------------------------------------------------------------------------- Patient/Caregiver Education Details Patient Name: [name], [name] Date of Service: 01/23/2015 1:00 PM Medical Record Number: [id number] Patient Account Number: [id number] Date of Birth/Gender: [birth year] (81 y.o. Female) Treating RN: Montey Hora Primary Care Physician: Ria Bush Other Clinician: Referring Physician: Ria Bush Treating Physician/Extender: Frann Rider in Treatment: 79 Education Assessment Education Provided To: Patient Education Topics Provided Venous: Handouts: Other: keep wrap on for the week and call with problems Methods: Explain/Verbal  Responses: State content correctly Electronic Signature(s) Signed: 01/23/2015 3:34:06 PM By: Gretta Cool RN, BSN, Kim  RN, BSN Previous Signature: 01/23/2015 1:29:26 PM Version By: Judene Companion MD Entered By: Gretta Cool, RN, BSN, Kim on 01/23/2015 13:34:16 [name], [name] ([id number]) -------------------------------------------------------------------------------- Wound Assessment Details Patient Name: [name], [name] Date of Service: 01/23/2015 1:00 PM Medical Record Number: [id number] Patient Account Number: [id number] Date of Birth/Sex: [birth year] (81 y.o. Female) Treating RN: Cornell Barman Primary Care Physician: Ria Bush Other Clinician: Referring Physician: Ria Bush Treating Physician/Extender: Frann Rider in Treatment: 23 Wound Status Wound Number: 1 Primary Venous Leg Ulcer Etiology: Wound Location: Right Lower Leg - Medial Wound Open Wounding Event: Gradually Appeared Status: Date Acquired: 04/04/2014 Comorbid Cataracts, Anemia, Coronary Artery Weeks Of Treatment: 23 History: Disease, Hypertension, Myocardial Clustered Wound: No Infarction, Type II Diabetes, Osteoarthritis Photos Photo Uploaded By: Gretta Cool, RN, BSN, Kim on 01/23/2015 15:24:59 Wound Measurements Length: (cm) 4 Width: (cm) 1.2 Depth: (cm) 0.1 Area: (cm) 3.77 Volume: (cm) 0.377 % Reduction in Area: 36% % Reduction in Volume: 78.7% Epithelialization: Small (1-33%) Wound Description Full Thickness Without Exposed Foul Odor Af Classification: Support Structures Due to Produ Grade 1 [name], [name] ([id number]) ter Cleansing: Yes ct Use: No Diabetic Severity [name], [name] Exudate Amount: Medium Exudate Type: Serosanguineous Exudate Color: red, brown Wound Bed Granulation Amount: Large (67-100%) Exposed Structure Granulation Quality: Red Fascia Exposed: No Necrotic Amount: None Present (0%) Fat Layer Exposed: No Tendon Exposed: No Muscle Exposed: No Joint Exposed: No Bone Exposed: No Limited to Skin Breakdown Periwound Skin Texture Texture  Color No Abnormalities Noted: No No Abnormalities Noted: No Callus: No Atrophie Blanche: No Crepitus: No Cyanosis: No Excoriation: No Ecchymosis: No Fluctuance: No Erythema: No Friable: No Hemosiderin Staining: No Induration: No Mottled: Yes Localized Edema: No Pallor: No Rash: No Rubor: No Scarring: No Temperature / Pain Moisture Temperature: No Abnormality No Abnormalities Noted: No Tenderness on Palpation: Yes Dry / Scaly: No Maceration: No Moist: Yes Wound Preparation Ulcer Cleansing: Other: soap and water, Topical Anesthetic Applied: Other: lidocaine 4%, Treatment Notes Wound #1 (Right, Medial Lower Leg) 1. Cleansed with: Clean wound with Normal Saline 2. Anesthetic Topical Lidocaine 4% cream to wound bed prior to debridement 4. Dressing Applied: Macbeth, Geneve C. ([id number]) Other dressing (specify in notes) 5. Secondary Dressing Applied ABD Pad 7. Secured with 2 Layer Lite Compression System - Right Lower Extremity Notes Sorbact hydroactice B, ABD pad Electronic Signature(s) Signed: 01/23/2015 3:34:06 PM By: Gretta Cool, RN, BSN, Kim RN, BSN Entered By: Gretta Cool, RN, BSN, Kim on 01/23/2015 13:17:45 Jepsen, [name] ([id number]) -------------------------------------------------------------------------------- [place] Details Patient Name: App, Kayelynn C. Date of Service: 01/23/2015 1:00 PM Medical Record Number: [id number] Patient Account Number: [id number] Date of Birth/Sex: [birth year] (81 y.o. Female) Treating RN: Cornell Barman Primary Care Physician: Ria Bush Other Clinician: Referring Physician: Ria Bush Treating Physician/Extender: Frann Rider in Treatment: 23 Vital Signs Time Taken: 13:07 Temperature (F): 98.1 Height (in): 58 Pulse (bpm): 86 Weight (lbs): 151 Respiratory Rate (breaths/min): 18 Body Mass Index (BMI): 31.6 Blood Pressure (mmHg): 90/64 Reference Range: 80 - 120 mg / dl Electronic Signature(s) Signed:  01/23/2015 3:34:06 PM By: Gretta Cool, RN, BSN, Kim RN, BSN Entered By: Gretta Cool, RN, BSN, Kim on 01/23/2015 13:07:21

## 2015-01-26 NOTE — Progress Notes (Signed)
KINNLEY, FILYAW ([id number]) Visit Report for 01/23/2015 Chief Complaint Document Details Patient Name: [name], [name] Date of Service: 01/23/2015 1:00 PM Medical Record Number: [id number] Patient Account Number: [id number] Date of Birth/Sex: Oct 26, 1927 (81 y.o. Female) Treating RN: Baruch Gouty, RN, BSN, Velva Harman Primary Care Physician: Ria Bush Other Clinician: Referring Physician: Ria Bush Treating Physician/Extender: Frann Rider in Treatment: 23 Information Obtained from: Patient Chief Complaint Patient presents for treatment of an open ulcer due to venous insufficiency. 81 year old patient who is known to me from the [facility] wound care center now comes in follow up here for a chronic venous ulceration of her right lower extremity which she's had for about 6 months. Electronic Signature(s) Signed: 01/23/2015 1:25:12 PM By: Judene Companion MD Signed: 01/26/2015 8:13:13 AM By: Christin Fudge MD, FACS Entered By: Judene Companion on 01/23/2015 13:25:12 Befort, [name] -------------------------------------------------------------------------------- HPI Details Patient Name: [name], [name] Date of Service: 01/23/2015 1:00 PM Medical Record Number: [id number] Patient Account Number: [id number] Date of Birth/Sex: [birth year] (81 y.o. Female) Treating RN: Afful, RN, BSN, Velva Harman Primary Care Physician: Ria Bush Other Clinician: Referring Physician: Ria Bush Treating Physician/Extender: Frann Rider in Treatment: 23 History of Present Illness Location: ulcer in the right lower extremity for several months Quality: Patient reports experiencing a dull pain to affected area(s). Severity: Patient states wound (s) are getting better. Duration: Patient has had the wound for > 3 months prior to seeking treatment at the wound center Timing: Pain in wound is Intermittent (comes and goes Context: The wound appeared gradually over  time Modifying Factors: Consults to this date include: vascular surgery for the veins in December 2015 and she's been seeing the wound care center at [facility] for several months. Associated Signs and Symptoms: Patient reports having difficulty standing for long periods. HPI Description: This is an 81 yrs old female we have been following since the beginning of January for a wound on the right medial ankle. She had previously had venous ablation of the greater saphenous vein by Dr. Kellie Simmering. She came to [place] with erythema of the right leg and subsequently is opened in the medial aspect of the right ankle. She has not been very compliant with compression. We ordered her at juxta lite stocking, however she apparently has not been able to wear it. The area on the medial aspect of her left ankle has much less induration and erythema. The edema is better controlled. No evidence of infection is noted. 07/18/14; no major change in the condition of this wound. There is still surface slough and probably some drainage. 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 All other veins showed no evidence of DVT, superficial venous thrombosis or incompetence. however she does have manifestations of chronic venous hypertension and I believe we will change her dressing over to Prisma and a Profore light dressing. she sees her primary care doctor regularly and her last hemoglobin A1c has been around 7%.her other comorbidities are hypertension, CABG in 1998, peptic ulcer disease, history of echo with a ejection fraction of 55-60% and grade 1 diastolic dysfunction. past surgical history include coronary artery bypass graft in 1998, cholecystectomy, appendectomy, hand surgery, endovenous ablation of the saphenous vein with laser by Dr. Kellie Simmering in December 2015. 08/29/2014 - she complains of increased calf  pain over the past week. removed her compression bandage and pain improved. no fever or  chills. Mild clear drainage. 09/04/2014 -- she has not been compliant and has not done a dressing change as we had ordered last week. She always complains and does not keep her wrap on and this past week she has done the same. She has completed a course of doxycycline. 09/25/2014 -- she has tolerated her dressing all week and has not removed it. She says it's hurting a lot today and would not like any debridement. We will do so as per her wishes. she did get a appointment with the vascular surgeon and sees him on 6/28. [name], [name] 10/03/2014 - She was seen by Dr. Tinnie Gens 09/30/2014. Assessment: I reviewed the previous venous duplex exam study performed in September 2015 which reveals no options for treating this venous ulcer. There is no significant superficial venous reflux to treat with laser ablation.Most recent ABI was 0.91 in the right foot with palpable dorsalis pedis pulse at 3+ Plan: patient needs to return to the [place] wound center and be treated by Dr. Con Memos. No arterial or venous options available from vascular standpoint or treatment of right leg stasis ulcer.Continued dressing changes and compression is the treatment of choice 10/16/2014 -- today will be her first application of Apligraf. 10/23/2014 -- we are going to be able to apply her second day of Apligraf next week. She is complaining of some tenderness in the region of Achilles tendon and this is possibly due to her wrap. 11/06/2014 -- she did not have Apligraf last week because the wound was not looking good but she says she is feeling much better now. 11/13/2014 -- she has been doing well this week and she is here for her second Apligraf. 11/27/2014 -- she is here for her third application of Apligraf 12/12/2014 -- she is here for her fourth application of Apligraf. Also during these past 3 days she had an  injury to her left forearm which she caught against the edge of her desk and has a lacerated wound which has gone down to the subcutaneous tissue. 12/26/2014 -- she is here for a last/fifth application of Apligraf. Electronic Signature(s) Signed: 01/23/2015 1:25:36 PM By: Judene Companion MD Signed: 01/26/2015 8:13:13 AM By: Christin Fudge MD, FACS Entered By: Judene Companion on 01/23/2015 13:25:36 [name], [name] -------------------------------------------------------------------------------- Physical Exam Details Patient Name: Granieri, Khamiya C. Date of Service: 01/23/2015 1:00 PM Medical Record Number: [id number] Patient Account Number: [id number] Date of Birth/Sex: September 12, 1927 (81 y.o. Female) Treating RN: Afful, RN, BSN, Velva Harman Primary Care Physician: Ria Bush Other Clinician: Referring Physician: Ria Bush Treating Physician/Extender: Frann Rider in Treatment: 23 Electronic Signature(s) Signed: 01/23/2015 1:25:46 PM By: Judene Companion MD Signed: 01/26/2015 8:13:13 AM By: Christin Fudge MD, FACS Entered By: Judene Companion on 01/23/2015 13:25:46 [name], [name] -------------------------------------------------------------------------------- Physician Orders Details Patient Name: [name], [name] Date of Service: 01/23/2015 1:00 PM Medical Record Number: [id number] Patient Account Number: [id number] Date of Birth/Sex: 1927/07/04 (81 y.o. Female) Treating RN: Montey Hora Primary Care Physician: Ria Bush Other Clinician: Referring Physician: Ria Bush Treating Physician/Extender: Frann Rider in Treatment: 72 Verbal / Phone Orders: Yes Clinician: Montey Hora Read Back and Verified: Yes Diagnosis Coding Wound Cleansing Wound #1 Right,Medial Lower Leg o Cleanse wound with mild soap and water Anesthetic Wound #1 Right,Medial Lower Leg o Topical Lidocaine 4% cream applied to wound bed prior to  debridement Primary Wound Dressing Wound #1 Right,Medial Lower Leg o Other: - Sorbact Hydroactive B Secondary Dressing Wound #1 Right,Medial Lower Leg   o ABD pad Dressing Change Frequency Wound #1 Right,Medial Lower Leg o Change dressing every week Follow-up Appointments Wound #1 Right,Medial Lower Leg o Return Appointment in 1 week. Edema Control Wound #1 Right,Medial Lower Leg o 3 Layer Compression System - Right Lower Extremity Electronic Signature(s) Signed: 01/23/2015 3:16:02 PM By: Montey Hora Signed: 01/26/2015 8:13:13 AM By: Christin Fudge MD, FACS Entered By: Montey Hora on 01/23/2015 13:23:22 Pozo, [name] [name], [name] -------------------------------------------------------------------------------- Problem List Details Patient Name: [name], [name] Date of Service: 01/23/2015 1:00 PM Medical Record Number: [id number] Patient Account Number: [id number] Date of Birth/Sex: Oct 24, 1927 (81 y.o. Female) Treating RN: Baruch Gouty, RN, BSN, Velva Harman Primary Care Physician: Ria Bush Other Clinician: Referring Physician: Ria Bush Treating Physician/Extender: Frann Rider in Treatment: 23 Active Problems ICD-10 Encounter Code Description Active Date Diagnosis E11.622 Type 2 diabetes mellitus with other skin ulcer 08/15/2014 Yes E11.610 Type 2 diabetes mellitus with diabetic neuropathic 08/15/2014 Yes arthropathy I87.331 Chronic venous hypertension (idiopathic) with ulcer and 08/15/2014 Yes inflammation of right lower extremity L03.115 Cellulitis of right lower limb 08/29/2014 Yes S41.112A Laceration without foreign body of left upper arm, initial 12/12/2014 Yes encounter Inactive Problems Resolved Problems Electronic Signature(s) Signed: 01/23/2015 1:24:44 PM By: Judene Companion MD Signed: 01/26/2015 8:13:13 AM By: Christin Fudge MD, FACS Entered By: Judene Companion on 01/23/2015 13:24:44 [name], [name]  ([id number]) -------------------------------------------------------------------------------- Progress Note Details Patient Name: [name], [name] Date of Service: 01/23/2015 1:00 PM Medical Record Number: [id number] Patient Account Number: [id number] Date of Birth/Sex: 07/31/1927 (81 y.o. Female) Treating RN: Baruch Gouty, RN, BSN, Velva Harman Primary Care Physician: Ria Bush Other Clinician: Referring Physician: Ria Bush Treating Physician/Extender: Frann Rider in Treatment: 23 Subjective Chief Complaint Information obtained from Patient Patient presents for treatment of an open ulcer due to venous insufficiency. 81 year old patient who is known to me from the [facility] wound care center now comes in follow up here for a chronic venous ulceration of her right lower extremity which she's had for about 6 months. History of Present Illness (HPI) The following HPI elements were documented for the patient's wound: Location: ulcer in the right lower extremity for several months Quality: Patient reports experiencing a dull pain to affected area(s). Severity: Patient states wound (s) are getting better. Duration: Patient has had the wound for > 3 months prior to seeking treatment at the wound center Timing: Pain in wound is Intermittent (comes and goes Context: The wound appeared gradually over time Modifying Factors: Consults to this date include: vascular surgery for the veins in December 2015 and she's been seeing the wound care center at [facility] for several months. Associated Signs and Symptoms: Patient reports having difficulty standing for long periods. This is an 81 yrs old female we have been following since the beginning of January for a wound on the right medial ankle. She had previously had venous ablation of the greater saphenous vein by Dr. Kellie Simmering. She came to [place] with erythema of the right leg and subsequently is opened in the medial aspect of the right ankle.  She has not been very compliant with compression. We ordered her at juxta lite stocking, however she apparently has not been able to wear it. The area on the medial aspect of her left ankle has much less induration and erythema. The edema is better controlled. No evidence of infection is noted. 07/18/14; no major change in the condition of this wound. There is still surface slough and probably some drainage. 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 All other veins showed no evidence of DVT, superficial venous thrombosis or incompetence. however she does have manifestations of chronic venous hypertension and I believe we will change her dressing over to Prisma and a Profore light dressing. she sees her primary care doctor regularly and her last hemoglobin A1c has been around 7%.her other comorbidities are hypertension, CABG in 1998, peptic ulcer disease, history of echo with a ejection fraction of 55-60% and grade 1 diastolic dysfunction. past surgical history include coronary artery bypass graft in 1998, cholecystectomy, appendectomy, hand surgery, endovenous ablation of the saphenous vein with laser by Dr. Kellie Simmering in December 2015. [name], [name] ([id number]) 08/29/2014 - she complains of increased calf pain over the past week. removed her compression bandage and pain improved. no fever or chills. Mild clear drainage. 09/04/2014 -- she has not been compliant and has not done a dressing change as we had ordered last week. She always complains and does not keep her wrap on and this past week she has done the same. She has completed a course of doxycycline. 09/25/2014 -- she has tolerated her dressing all week and has not removed it. She says it's hurting a lot today and would not like any debridement. We will do so as per her wishes. she did get a appointment with the  vascular surgeon and sees him on 6/28. 10/03/2014 - She was seen by Dr. Tinnie Gens 09/30/2014. Assessment: I reviewed the previous venous duplex exam study performed in September 2015 which reveals no options for treating this venous ulcer. There is no significant superficial venous reflux to treat with laser ablation.Most recent ABI was 0.91 in the right foot with palpable dorsalis pedis pulse at 3+ Plan: patient needs to return to the [place] wound center and be treated by Dr. Con Memos. No arterial or venous options available from vascular standpoint or treatment of right leg stasis ulcer.Continued dressing changes and compression is the treatment of choice 10/16/2014 -- today will be her first application of Apligraf. 10/23/2014 -- we are going to be able to apply her second day of Apligraf next week. She is complaining of some tenderness in the region of Achilles tendon and this is possibly due to her wrap. 11/06/2014 -- she did not have Apligraf last week because the wound was not looking good but she says she is feeling much better now. 11/13/2014 -- she has been doing well this week and she is here for her second Apligraf. 11/27/2014 -- she is here for her third application of Apligraf 12/12/2014 -- she is here for her fourth application of Apligraf. Also during these past 3 days she had an injury to her left forearm which she caught against the edge of her desk and has a lacerated wound which has gone down to the subcutaneous tissue. 12/26/2014 -- she is here for a last/fifth application of Apligraf. Objective Constitutional Vitals Time Taken: 1:07 PM, Height: 58 in, Weight: 151 lbs, BMI: 31.6, Temperature: 98.1 F, Pulse: 86 bpm, Respiratory Rate: 18 breaths/min, Blood Pressure: 90/64 mmHg. Integumentary (Hair, Skin) Wound #1 status is Open. Original cause of wound was Gradually Appeared. The wound is located on the Right,Medial Lower Leg. The wound measures 4cm length x 1.2cm  width x 0.1cm depth; 3.77cm^2 area and 0.377cm^3 volume. The wound is limited to skin breakdown. There is a medium amount of serosanguineous drainage noted.  The wound margin is distinct with the outline attached to the wound base. There is large (67-100%) red granulation within the wound bed. There is no necrotic tissue within the wound [name], [name] bed. The periwound skin appearance exhibited: Moist, Mottled. The periwound skin appearance did not exhibit: Callus, Crepitus, Excoriation, Fluctuance, Friable, Induration, Localized Edema, Rash, Scarring, Dry/Scaly, Maceration, Atrophie Blanche, Cyanosis, Ecchymosis, Hemosiderin Staining, Pallor, Rubor, Erythema. Periwound temperature was noted as No Abnormality. The periwound has tenderness on palpation. Venous ulcer medial right leg smaller and has good granulation. Continue dressing with Sorbatt. Assessment Active Problems ICD-10 E11.622 - Type 2 diabetes mellitus with other skin ulcer E11.610 - Type 2 diabetes mellitus with diabetic neuropathic arthropathy I87.331 - Chronic venous hypertension (idiopathic) with ulcer and inflammation of right lower extremity L03.115 - Cellulitis of right lower limb S41.112A - Laceration without foreign body of left upper arm, initial encounter Plan Wound Cleansing: Wound #1 Right,Medial Lower Leg: Cleanse wound with mild soap and water Anesthetic: Wound #1 Right,Medial Lower Leg: Topical Lidocaine 4% cream applied to wound bed prior to debridement Primary Wound Dressing: Wound #1 Right,Medial Lower Leg: Other: - Sorbact Hydroactive B Secondary Dressing: Wound #1 Right,Medial Lower Leg: ABD pad Dressing Change Frequency: Wound #1 Right,Medial Lower Leg: Change dressing every week Follow-up Appointments: Wound #1 Right,Medial Lower Leg: Return Appointment in 1 week. Edema Control: Wound #1 Right,Medial Lower Leg: MYRELLA, HULICK ([id number]) 3 Layer Compression System -  Right Lower Extremity Electronic Signature(s) Signed: 01/23/2015 1:27:58 PM By: Judene Companion MD Signed: 01/26/2015 8:13:13 AM By: Christin Fudge MD, FACS Entered By: Judene Companion on 01/23/2015 13:27:57 Osberg, [name] -------------------------------------------------------------------------------- SuperBill Details Patient Name: Petrak, Ralyn C. Date of Service: 01/23/2015 Medical Record Number: [id number] Patient Account Number: [id number] Date of Birth/Sex: [birth year] (81 y.o. Female) Treating RN: Montey Hora Primary Care Physician: Ria Bush Other Clinician: Referring Physician: Ria Bush Treating Physician/Extender: Frann Rider in Treatment: 23 Diagnosis Coding ICD-10 Codes Code Description E11.622 Type 2 diabetes mellitus with other skin ulcer E11.610 Type 2 diabetes mellitus with diabetic neuropathic arthropathy Chronic venous hypertension (idiopathic) with ulcer and inflammation of right lower I87.331 extremity L03.115 Cellulitis of right lower limb S41.112A Laceration without foreign body of left upper arm, initial encounter Facility Procedures CPT4: Description Modifier Quantity Code [id number] (Facility Use Only) [id number] - [place] RT 1 LEG Physician Procedures CPT4: Description Modifier Quantity Code [id number] - WC PHYS LEVEL 2 - EST PT 1 ICD-10 Description Diagnosis I87.331 Chronic venous hypertension (idiopathic) with ulcer and inflammation of right lower extremity Electronic Signature(s) Signed: 01/23/2015 1:28:41 PM By: Judene Companion MD Signed: 01/26/2015 8:13:13 AM By: Christin Fudge MD, FACS Entered By: Judene Companion on 01/23/2015 13:28:41

## 2015-01-27 ENCOUNTER — Other Ambulatory Visit: Payer: Self-pay | Admitting: *Deleted

## 2015-01-27 ENCOUNTER — Encounter: Payer: Self-pay | Admitting: *Deleted

## 2015-01-27 MED ORDER — HYDROCODONE-ACETAMINOPHEN 5-325 MG PO TABS: 0.5000 | ORAL_TABLET | Freq: Four times a day (QID) | ORAL | Status: DC | PRN

## 2015-01-27 NOTE — Telephone Encounter (Signed)
Pt requesting medication refill. Last saw Dr Darnell Level 08/2014-acute. Requesting son be able to pickup Rx, but advised pt, per chart, she is needing to update her contract

## 2015-01-27 NOTE — Telephone Encounter (Signed)
Did you want her to update UDS?

## 2015-01-27 NOTE — Telephone Encounter (Signed)
Attempted to call patient. Number busy on multiple occasions. Will try again later.

## 2015-01-27 NOTE — Telephone Encounter (Signed)
Yes plz- last done 12/2013.

## 2015-01-27 NOTE — Telephone Encounter (Signed)
Printed and in Kim's box 

## 2015-01-28 ENCOUNTER — Encounter: Payer: Self-pay | Admitting: Family Medicine

## 2015-01-28 DIAGNOSIS — Z79891 Long term (current) use of opiate analgesic: Secondary | ICD-10-CM | POA: Diagnosis not present

## 2015-01-28 NOTE — Telephone Encounter (Signed)
Patient notified and Rx placed up front for pick up. 

## 2015-01-29 ENCOUNTER — Encounter: Payer: Medicare Other | Admitting: Surgery

## 2015-01-29 DIAGNOSIS — I87331 Chronic venous hypertension (idiopathic) with ulcer and inflammation of right lower extremity: Secondary | ICD-10-CM | POA: Diagnosis not present

## 2015-01-29 DIAGNOSIS — E11610 Type 2 diabetes mellitus with diabetic neuropathic arthropathy: Secondary | ICD-10-CM | POA: Diagnosis not present

## 2015-01-29 DIAGNOSIS — E11622 Type 2 diabetes mellitus with other skin ulcer: Secondary | ICD-10-CM | POA: Diagnosis not present

## 2015-01-29 DIAGNOSIS — L97211 Non-pressure chronic ulcer of right calf limited to breakdown of skin: Secondary | ICD-10-CM | POA: Diagnosis not present

## 2015-01-29 DIAGNOSIS — I10 Essential (primary) hypertension: Secondary | ICD-10-CM | POA: Diagnosis not present

## 2015-01-29 DIAGNOSIS — L03115 Cellulitis of right lower limb: Secondary | ICD-10-CM | POA: Diagnosis not present

## 2015-01-30 NOTE — Progress Notes (Signed)
GITA, DESHOTELS ([id number]) Visit Report for 01/29/2015 Arrival Information Details Patient Name: [name], [name] Date of Service: 01/29/2015 1:00 PM Medical Record Number: [id number] Patient Account Number: [id number] Date of Birth/Sex: Aug 09, 1927 (81 y.o. Female) Treating RN: Cornell Barman Primary Care Physician: Ria Bush Other Clinician: Referring Physician: Ria Bush Treating Physician/Extender: Frann Rider in Treatment: 23 Visit Information History Since Last Visit Added or deleted any medications: No Patient Arrived: Ambulatory Any new allergies or adverse reactions: No Arrival Time: 13:18 Had a fall or experienced change in No Accompanied By: self activities of daily living that may affect Transfer Assistance: None risk of falls: Patient Identification Verified: Yes Signs or symptoms of abuse/neglect since last No Secondary Verification Process Yes visito Completed: Hospitalized since last visit: No Patient Has Alerts: Yes Has Dressing in Place as Prescribed: Yes Patient Alerts: Patient on Blood Has Compression in Place as Prescribed: Yes Thinner Pain Present Now: No Type II Diabetic 81 MG aspirin Electronic Signature(s) Signed: 01/29/2015 6:06:11 PM By: Gretta Cool, RN, BSN, Kim RN, BSN Entered By: Gretta Cool, RN, BSN, Kim on 01/29/2015 13:18:16 Carraway, [name] ([id number]) -------------------------------------------------------------------------------- Encounter Discharge Information Details Patient Name: [name], [name] Date of Service: 01/29/2015 1:00 PM Medical Record Number: [id number] Patient Account Number: [id number] Date of Birth/Sex: Aug 06, 1927 (81 y.o. Female) Treating RN: Cornell Barman Primary Care Physician: Ria Bush Other Clinician: Referring Physician: Ria Bush Treating Physician/Extender: Frann Rider in Treatment: 23 Encounter Discharge Information Items Discharge Pain Level: 0 Discharge Condition:  Stable Ambulatory Status: Walker Discharge Destination: Home Transportation: Private Auto Accompanied By: self Schedule Follow-up Appointment: Yes Medication Reconciliation completed and provided to Patient/Care Yes Troye Hiemstra: Provided on Clinical Summary of Care: 01/29/2015 Form Type Recipient Paper Patient LL Electronic Signature(s) Signed: 01/29/2015 6:06:11 PM By: Gretta Cool RN, BSN, Kim RN, BSN Previous Signature: 01/29/2015 1:39:09 PM Version By: Ruthine Dose Entered By: Gretta Cool RN, BSN, Kim on 01/29/2015 13:42:24 Foulks, [name] ([id number]) -------------------------------------------------------------------------------- Lower Extremity Assessment Details Patient Name: [name], [name] Date of Service: 01/29/2015 1:00 PM Medical Record Number: [id number] Patient Account Number: [id number] Date of Birth/Sex: 1927/08/02 (81 y.o. Female) Treating RN: Cornell Barman Primary Care Physician: Ria Bush Other Clinician: Referring Physician: Ria Bush Treating Physician/Extender: Frann Rider in Treatment: 23 Edema Assessment Assessed: [Left: No] [Right: No] E[Left: dema] [Right: :] Calf Left: Right: Point of Measurement: 27 cm From Medial Instep cm 26 cm Ankle Left: Right: Point of Measurement: 10 cm From Medial Instep cm 20.2 cm Vascular Assessment Pulses: Posterior Tibial Dorsalis Pedis Palpable: [Right:Yes] Doppler: [Right:Multiphasic] Extremity colors, hair growth, and conditions: Extremity Color: [Right:Hyperpigmented] Hair Growth on Extremity: [Right:No] Temperature of Extremity: [Right:Warm] Capillary Refill: [Right:< 3 seconds] Toe Nail Assessment Left: Right: Thick: No Discolored: No Deformed: No Improper Length and Hygiene: No Electronic Signature(s) Signed: 01/29/2015 6:06:11 PM By: Gretta Cool, RN, BSN, Kim RN, BSN Entered By: Gretta Cool, RN, BSN, Kim on 01/29/2015 13:20:09 [place]ak, [name] ([id number]) Vickers, Aleese Loletha Grayer  ([id number]) -------------------------------------------------------------------------------- Multi Wound Chart Details Patient Name: [name], [name] Date of Service: 01/29/2015 1:00 PM Medical Record Number: [id number] Patient Account Number: [id number] Date of Birth/Sex: [birth year] (81 y.o. Female) Treating RN: Cornell Barman Primary Care Physician: Ria Bush Other Clinician: Referring Physician: Ria Bush Treating Physician/Extender: Frann Rider in Treatment: 23 Vital Signs Height(in): 58 Pulse(bpm): 84 Weight(lbs): 151 Blood Pressure 98/43 (mmHg): Body Mass Index(BMI): 32 Temperature(F): 98.1 Respiratory Rate 18 (breaths/min): Photos: [1:No Photos] [N/A:N/A] Wound Location: [1:Right Lower Leg - Medial] [N/A:N/A] Wounding Event: [1:Gradually Appeared] [N/A:N/A] Primary Etiology: [  1:Venous Leg Ulcer] [N/A:N/A] Comorbid History: [1:Cataracts, Anemia, Coronary Artery Disease, Hypertension, Myocardial Infarction, Type II Diabetes, Osteoarthritis] [N/A:N/A] Date Acquired: [1:04/04/2014] [N/A:N/A] Weeks of Treatment: [1:23] [N/A:N/A] Wound Status: [1:Open] [N/A:N/A] Measurements L x W x D 4.5x1.5x0.1 [N/A:N/A] (cm) Area (cm) : [1:5.301] [N/A:N/A] Volume (cm) : [1:0.53] [N/A:N/A] % Reduction in Area: [1:10.00%] [N/A:N/A] % Reduction in Volume: 70.00% [N/A:N/A] Classification: [1:Full Thickness Without Exposed Support Structures] [N/A:N/A] HBO Classification: [1:Grade 1] [N/A:N/A] Exudate Amount: [1:Medium] [N/A:N/A] Exudate Type: [1:Serosanguineous] [N/A:N/A] Exudate Color: [1:red, brown] [N/A:N/A] Foul Odor After [1:Yes] [N/A:N/A] Cleansing: Odor Anticipated Due to No [N/A:N/A] Product Use: Wound Margin: [1:Distinct, outline attached] [N/A:N/A] Granulation Amount: Large (67-100%) N/A N/A Granulation Quality: Red N/A N/A Necrotic Amount: None Present (0%) N/A N/A Exposed Structures: Fascia: No N/A N/A Fat: No Tendon: No Muscle: No Joint:  No Bone: No Limited to Skin Breakdown Epithelialization: Small (1-33%) N/A N/A Periwound Skin Texture: Edema: No N/A N/A Excoriation: No Induration: No Callus: No Crepitus: No Fluctuance: No Friable: No Rash: No Scarring: No Periwound Skin Moist: Yes N/A N/A Moisture: Maceration: No Dry/Scaly: No Periwound Skin Color: Mottled: Yes N/A N/A Atrophie Blanche: No Cyanosis: No Ecchymosis: No Erythema: No Hemosiderin Staining: No Pallor: No Rubor: No Temperature: No Abnormality N/A N/A Tenderness on Yes N/A N/A Palpation: Wound Preparation: Ulcer Cleansing: Other: N/A N/A soap and water Topical Anesthetic Applied: Other: lidocaine 4% Treatment Notes Electronic Signature(s) Signed: 01/29/2015 6:06:11 PM By: Gretta Cool, RN, BSN, Kim RN, BSN Entered By: Gretta Cool, RN, BSN, Kim on 01/29/2015 13:23:51 Sickinger, [name] ([id number]) -------------------------------------------------------------------------------- [place] Plan Details Patient Name: [name], [name] Date of Service: 01/29/2015 1:00 PM Medical Record Number: [id number] Patient Account Number: [id number] Date of Birth/Sex: March 09, 1928 (81 y.o. Female) Treating RN: Cornell Barman Primary Care Physician: Ria Bush Other Clinician: Referring Physician: Ria Bush Treating Physician/Extender: Frann Rider in Treatment: 75 Active Inactive Abuse / Safety / Falls / Self Care Management Nursing Diagnoses: Potential for falls Goals: Patient will remain injury free Date Initiated: 08/15/2014 Goal Status: Active Interventions: Assess fall risk on admission and as needed Notes: Nutrition Nursing Diagnoses: Impaired glucose control: actual or potential Goals: Patient/caregiver verbalizes understanding of need to maintain therapeutic glucose control per primary care physician Date Initiated: 08/15/2014 Goal Status: Active Interventions: Assess patient nutrition upon admission and as  needed per policy Notes: Orientation to the Wound Care Program Nursing Diagnoses: Knowledge deficit related to the wound healing center program Goals: Patient/caregiver will verbalize understanding of the [place], [place] C. ([id number]) Date Initiated: 08/15/2014 Goal Status: Active Interventions: Provide education on orientation to the wound center Notes: Venous Leg Ulcer Nursing Diagnoses: Actual venous Insuffiency (use after diagnosis is confirmed) Goals: Patient will maintain optimal edema control Date Initiated: 08/15/2014 Goal Status: Active Interventions: Compression as ordered Treatment Activities: Test ordered outside of clinic : 01/29/2015 Notes: Wound/Skin Impairment Nursing Diagnoses: Impaired tissue integrity Goals: Patient/caregiver will verbalize understanding of skin care regimen Date Initiated: 08/15/2014 Goal Status: Active Interventions: Assess patient/caregiver ability to obtain necessary supplies Notes: Electronic Signature(s) Signed: 01/29/2015 6:06:11 PM By: Gretta Cool, RN, BSN, Kim RN, BSN Entered By: Gretta Cool, RN, BSN, Kim on 01/29/2015 13:23:44 Casalino, [name] ([id number]) -------------------------------------------------------------------------------- Pain Assessment Details Patient Name: Lewers, Kasi C. Date of Service: 01/29/2015 1:00 PM Medical Record Number: [id number] Patient Account Number: [id number] Date of Birth/Sex: [birth year] (81 y.o. Female) Treating RN: Cornell Barman Primary Care Physician: Ria Bush Other Clinician: Referring Physician: Ria Bush Treating Physician/Extender: Frann Rider in Treatment: 23 Active Problems  Location of Pain Severity and Description of Pain Patient Has [name] No Site Locations Pain Management and Medication Current Pain Management: Electronic Signature(s) Signed: 01/29/2015 6:06:11 PM By: Gretta Cool, RN, BSN, Kim RN, BSN Entered By: Gretta Cool, RN, BSN, Kim on  01/29/2015 13:18:22 Schepp, [name] ([id number]) -------------------------------------------------------------------------------- Patient/Caregiver Education Details Patient Name: Muntean, Janayia C. Date of Service: 01/29/2015 1:00 PM Medical Record Number: [id number] Patient Account Number: [id number] Date of Birth/Gender: [birth year] (81 y.o. Female) Treating RN: Cornell Barman Primary Care Physician: Ria Bush Other Clinician: Referring Physician: Ria Bush Treating Physician/Extender: Frann Rider in Treatment: 33 Education Assessment Education Provided To: Patient Education Topics Provided Wound/Skin Impairment: Handouts: Caring for Your Ulcer, Other: continue wound cares as prescribed Methods: Demonstration, Explain/Verbal Responses: State content correctly Electronic Signature(s) Signed: 01/29/2015 6:06:11 PM By: Gretta Cool, RN, BSN, Kim RN, BSN Entered By: Gretta Cool, RN, BSN, Kim on 01/29/2015 13:42:46 Ekstrom, [name] ([id number]) -------------------------------------------------------------------------------- Wound Assessment Details Patient Name: [name], [name] Date of Service: 01/29/2015 1:00 PM Medical Record Number: [id number] Patient Account Number: [id number] Date of Birth/Sex: 10-Mar-1928 (81 y.o. Female) Treating RN: Cornell Barman Primary Care Physician: Ria Bush Other Clinician: Referring Physician: Ria Bush Treating Physician/Extender: Frann Rider in Treatment: 23 Wound Status Wound Number: 1 Primary Venous Leg Ulcer Etiology: Wound Location: Right Lower Leg - Medial Wound Open Wounding Event: Gradually Appeared Status: Date Acquired: 04/04/2014 Comorbid Cataracts, Anemia, Coronary Artery Weeks Of Treatment: 23 History: Disease, Hypertension, Myocardial Clustered Wound: No Infarction, Type II Diabetes, Osteoarthritis Photos Photo Uploaded By: Gretta Cool, RN, BSN, Kim on 01/29/2015 18:04:18 Wound Measurements Length:  (cm) 4.5 Width: (cm) 1.5 Depth: (cm) 0.1 Area: (cm) 5.301 Volume: (cm) 0.53 % Reduction in Area: 10% % Reduction in Volume: 70% Epithelialization: Small (1-33%) Wound Description Full Thickness Without Exposed Foul Odor Af Classification: Support Structures Due to Produ Grade 1 [name], [name] ([id number]) ter Cleansing: Yes ct Use: No Diabetic Severity [name]): Wound Margin: Distinct, outline attached Exudate Amount: Medium Exudate Type: Serosanguineous Exudate Color: red, brown Wound Bed Granulation Amount: Large (67-100%) Exposed Structure Granulation Quality: Red Fascia Exposed: No Necrotic Amount: None Present (0%) Fat Layer Exposed: No Tendon Exposed: No Muscle Exposed: No Joint Exposed: No Bone Exposed: No Limited to Skin Breakdown Periwound Skin Texture Texture Color No Abnormalities Noted: No No Abnormalities Noted: No Callus: No Atrophie Blanche: No Crepitus: No Cyanosis: No Excoriation: No Ecchymosis: No Fluctuance: No Erythema: No Friable: No Hemosiderin Staining: No Induration: No Mottled: Yes Localized Edema: No Pallor: No Rash: No Rubor: No Scarring: No Temperature / Pain Moisture Temperature: No Abnormality No Abnormalities Noted: No Tenderness on Palpation: Yes Dry / Scaly: No Maceration: No Moist: Yes Wound Preparation Ulcer Cleansing: Other: soap and water, Topical Anesthetic Applied: Other: lidocaine 4%, Treatment Notes Wound #1 (Right, Medial Lower Leg) 1. Cleansed with: Clean wound with Normal Saline Cleanse wound with antibacterial soap and water 2. Anesthetic Topical Lidocaine 4% cream to wound bed prior to debridement [name], [name] C. ([id number]) 3. Peri-wound Care: Barrier cream 4. Dressing Applied: Other dressing (specify in notes) 7. Secured with 2 Layer Lite Compression System - Right Lower Extremity Notes Sorbact, sorbion Electronic Signature(s) Signed: 01/29/2015 6:06:11 PM By: Gretta Cool, RN, BSN,  Kim RN, BSN Entered By: Gretta Cool, RN, BSN, Kim on 01/29/2015 13:22:18 Tomaso, [name] ([id number]) -------------------------------------------------------------------------------- [place] Details Patient Name: [name], [name] Date of Service: 01/29/2015 1:00 PM Medical Record Number: [id number] Patient Account Number: [id number] Date of Birth/Sex: Oct 28, 1927 (81 y.o. Female) Treating RN: Cornell Barman Primary Care Physician:  Ria Bush Other Clinician: Referring Physician: Ria Bush Treating Physician/Extender: Frann Rider in Treatment: 23 Vital Signs Time Taken: 13:18 Temperature (F): 98.1 Height (in): 58 Pulse (bpm): 84 Weight (lbs): 151 Respiratory Rate (breaths/min): 18 Body Mass Index (BMI): 31.6 Blood Pressure (mmHg): 98/43 Reference Range: 80 - 120 mg / dl Electronic Signature(s) Signed: 01/29/2015 6:06:11 PM By: Gretta Cool, RN, BSN, Kim RN, BSN Entered By: Gretta Cool, RN, BSN, Kim on 01/29/2015 13:18:40

## 2015-01-30 NOTE — Progress Notes (Signed)
CHISOM, RAZZA ([id number]) Visit Report for 01/29/2015 Chief Complaint Document Details Patient Name: [name], [name] Date of Service: 01/29/2015 1:00 PM Medical Record Number: [id number] Patient Account Number: [id number] Date of Birth/Sex: 1927/04/08 (81 y.o. Female) Treating RN: Cornell Barman Primary Care Physician: Ria Bush Other Clinician: Referring Physician: Ria Bush Treating Physician/Extender: Frann Rider in Treatment: 23 Information Obtained from: Patient Chief Complaint Patient presents for treatment of an open ulcer due to venous insufficiency. 81 year old patient who is known to me from the [facility] wound care center now comes in follow up here for a chronic venous ulceration of her right lower extremity which she's had for about 6 months. Electronic Signature(s) Signed: 01/29/2015 1:31:55 PM By: Christin Fudge MD, FACS Entered By: Christin Fudge on 01/29/2015 13:31:55 Gigante, [name] ([id number]) -------------------------------------------------------------------------------- HPI Details Patient Name: [name], [name] Date of Service: 01/29/2015 1:00 PM Medical Record Number: [id number] Patient Account Number: [id number] Date of Birth/Sex: 1927/08/11 (81 y.o. Female) Treating RN: Cornell Barman Primary Care Physician: Ria Bush Other Clinician: Referring Physician: Ria Bush Treating Physician/Extender: Frann Rider in Treatment: 23 History of Present Illness Location: ulcer in the right lower extremity for several months Quality: Patient reports experiencing a dull pain to affected area(s). Severity: Patient states wound (s) are getting better. Duration: Patient has had the wound for > 3 months prior to seeking treatment at the wound center Timing: Pain in wound is Intermittent (comes and goes Context: The wound appeared gradually over time Modifying Factors: Consults to this date include: vascular surgery for the  veins in December 2015 and she's been seeing the wound care center at [facility] for several months. Associated Signs and Symptoms: Patient reports having difficulty standing for long periods. HPI Description: This is an 81 yrs old female we have been following since the beginning of January for a wound on the right medial ankle. She had previously had venous ablation of the greater saphenous vein by Dr. Kellie Simmering. She came to [place] with erythema of the right leg and subsequently is opened in the medial aspect of the right ankle. She has not been very compliant with compression. We ordered her at juxta lite stocking, however she apparently has not been able to wear it. The area on the medial aspect of her left ankle has much less induration and erythema. The edema is better controlled. No evidence of infection is noted. 07/18/14; no major change in the condition of this wound. There is still surface slough and probably some drainage. 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 All other veins showed no evidence of DVT, superficial venous thrombosis or incompetence. however she does have manifestations of chronic venous hypertension and I believe we will change her dressing over to Prisma and a Profore light dressing. she sees her primary care doctor regularly and her last hemoglobin A1c has been around 7%.her other comorbidities are hypertension, CABG in 1998, peptic ulcer disease, history of echo with a ejection fraction of 55-60% and grade 1 diastolic dysfunction. past surgical history include coronary artery bypass graft in 1998, cholecystectomy, appendectomy, hand surgery, endovenous ablation of the saphenous vein with laser by Dr. Kellie Simmering in December 2015. 08/29/2014 - she complains of increased calf pain over the past week. removed her compression bandage and pain improved. no  fever or chills. Mild clear drainage. 09/04/2014 -- she has not been compliant and  has not done a dressing change as we had ordered last week. She always complains and does not keep her wrap on and this past week she has done the same. She has completed a course of doxycycline. 09/25/2014 -- she has tolerated her dressing all week and has not removed it. She says it's hurting a lot today and would not like any debridement. We will do so as per her wishes. she did get a appointment with the vascular surgeon and sees him on 6/28. [name] ([id number]) 10/03/2014 - She was seen by Dr. Tinnie Gens 09/30/2014. Assessment: I reviewed the previous venous duplex exam study performed in September 2015 which reveals no options for treating this venous ulcer. There is no significant superficial venous reflux to treat with laser ablation.Most recent ABI was 0.91 in the right foot with palpable dorsalis pedis pulse at 3+ Plan: patient needs to return to the [place] wound center and be treated by Dr. Con Memos. No arterial or venous options available from vascular standpoint or treatment of right leg stasis ulcer.Continued dressing changes and compression is the treatment of choice 10/16/2014 -- today will be her first application of Apligraf. 10/23/2014 -- we are going to be able to apply her second day of Apligraf next week. She is complaining of some tenderness in the region of Achilles tendon and this is possibly due to her wrap. 11/06/2014 -- she did not have Apligraf last week because the wound was not looking good but she says she is feeling much better now. 11/13/2014 -- she has been doing well this week and she is here for her second Apligraf. 11/27/2014 -- she is here for her third application of Apligraf 12/12/2014 -- she is here for her fourth application of Apligraf. Also during these past 3 days she had an injury to her left forearm which she caught against the edge of her desk and  has a lacerated wound which has gone down to the subcutaneous tissue. 12/26/2014 -- she is here for a last/fifth application of Apligraf. Electronic Signature(s) Signed: 01/29/2015 1:32:04 PM By: Christin Fudge MD, FACS Entered By: Christin Fudge on 01/29/2015 13:32:04 Whisnant, [name] ([id number]) -------------------------------------------------------------------------------- Physical Exam Details Patient Name: Knock, Lissette C. Date of Service: 01/29/2015 1:00 PM Medical Record Number: [id number] Patient Account Number: [id number] Date of Birth/Sex: Apr 03, 1928 (81 y.o. Female) Treating RN: Cornell Barman Primary Care Physician: Ria Bush Other Clinician: Referring Physician: Ria Bush Treating Physician/Extender: Frann Rider in Treatment: 23 Constitutional . Pulse regular. Respirations normal and unlabored. Afebrile. . Eyes Nonicteric. Reactive to light. Ears, Nose, Mouth, and Throat Lips, teeth, and gums WNL.Marland Kitchen Moist mucosa without lesions . Neck supple and nontender. No palpable supraclavicular or cervical adenopathy. Normal sized without goiter. Respiratory WNL. No retractions.. Cardiovascular Pedal Pulses WNL. No clubbing, cyanosis or edema. Lymphatic No adneopathy. No adenopathy. No adenopathy. Musculoskeletal Adexa without tenderness or enlargement.. Digits and nails w/o clubbing, cyanosis, infection, petechiae, ischemia, or inflammatory conditions.. Integumentary (Hair, Skin) No suspicious lesions. No crepitus or fluctuance. No peri-wound warmth or erythema. No masses.Marland Kitchen Psychiatric Judgement and insight Intact.. No evidence of depression, anxiety, or agitation.. Notes the wound on the medial part of the right lower extremity has minimal debris which was washed out with moist saline gauze. Electronic Signature(s) Signed: 01/29/2015 1:32:40 PM By: Christin Fudge MD, FACS Entered By: Christin Fudge on 01/29/2015 13:32:40 Dromgoole, [name]  ([id number]) -------------------------------------------------------------------------------- Physician Orders Details Patient Name: Wachtel, Secilia C. Date of Service: 01/29/2015 1:00 PM Medical Record Number:  [id number] Patient Account Number: [id number] Date of Birth/Sex: 1927-06-20 (81 y.o. Female) Treating RN: Cornell Barman Primary Care Physician: Ria Bush Other Clinician: Referring Physician: Ria Bush Treating Physician/Extender: Frann Rider in Treatment: 47 Verbal / Phone Orders: Yes Clinician: Cornell Barman Read Back and Verified: Yes Diagnosis Coding Wound Cleansing Wound #1 Right,Medial Lower Leg o Cleanse wound with mild soap and water Anesthetic Wound #1 Right,Medial Lower Leg o Topical Lidocaine 4% cream applied to wound bed prior to debridement Primary Wound Dressing Wound #1 Right,Medial Lower Leg o Other: - Sorbact and Sorbion for drainage Dressing Change Frequency Wound #1 Right,Medial Lower Leg o Change dressing every week Follow-up Appointments Wound #1 Right,Medial Lower Leg o Return Appointment in 1 week. Edema Control Wound #1 Right,Medial Lower Leg o 2 Layer Compression System - Right Lower Extremity Electronic Signature(s) Signed: 01/29/2015 3:59:19 PM By: Christin Fudge MD, FACS Signed: 01/29/2015 6:06:11 PM By: Gretta Cool RN, BSN, Kim RN, BSN Entered By: Gretta Cool, RN, BSN, Kim on 01/29/2015 13:30:37 Browe, [name] ([id number]) -------------------------------------------------------------------------------- Problem List Details Patient Name: Correia, Madasyn C. Date of Service: 01/29/2015 1:00 PM Medical Record Number: [id number] Patient Account Number: [id number] Date of Birth/Sex: 04-22-1927 (81 y.o. Female) Treating RN: Cornell Barman Primary Care Physician: Ria Bush Other Clinician: Referring Physician: Ria Bush Treating Physician/Extender: Frann Rider in Treatment: 23 Active  Problems ICD-10 Encounter Code Description Active Date Diagnosis E11.622 Type 2 diabetes mellitus with other skin ulcer 08/15/2014 Yes E11.610 Type 2 diabetes mellitus with diabetic neuropathic 08/15/2014 Yes arthropathy I87.331 Chronic venous hypertension (idiopathic) with ulcer and 08/15/2014 Yes inflammation of right lower extremity L03.115 Cellulitis of right lower limb 08/29/2014 Yes S41.112A Laceration without foreign body of left upper arm, initial 12/12/2014 Yes encounter Inactive Problems Resolved Problems Electronic Signature(s) Signed: 01/29/2015 1:31:49 PM By: Christin Fudge MD, FACS Entered By: Christin Fudge on 01/29/2015 13:31:49 Friddle, [name] ([id number]) -------------------------------------------------------------------------------- Progress Note Details Patient Name: Forde, Kinzleigh C. Date of Service: 01/29/2015 1:00 PM Medical Record Number: [id number] Patient Account Number: [id number] Date of Birth/Sex: December 01, 1927 (81 y.o. Female) Treating RN: Cornell Barman Primary Care Physician: Ria Bush Other Clinician: Referring Physician: Ria Bush Treating Physician/Extender: Frann Rider in Treatment: 23 Subjective Chief Complaint Information obtained from Patient Patient presents for treatment of an open ulcer due to venous insufficiency. 81 year old patient who is known to me from the [facility] wound care center now comes in follow up here for a chronic venous ulceration of her right lower extremity which she's had for about 6 months. History of Present Illness (HPI) The following HPI elements were documented for the patient's wound: Location: ulcer in the right lower extremity for several months Quality: Patient reports experiencing a dull pain to affected area(s). Severity: Patient states wound (s) are getting better. Duration: Patient has had the wound for > 3 months prior to seeking treatment at the wound center Timing: Pain in wound is  Intermittent (comes and goes Context: The wound appeared gradually over time Modifying Factors: Consults to this date include: vascular surgery for the veins in December 2015 and she's been seeing the wound care center at [facility] for several months. Associated Signs and Symptoms: Patient reports having difficulty standing for long periods. This is an 81 yrs old female we have been following since the beginning of January for a wound on the right medial ankle. She had previously had venous ablation of the greater saphenous vein by Dr. Kellie Simmering. She came to [place] with erythema of the right leg and subsequently is  opened in the medial aspect of the right ankle. She has not been very compliant with compression. We ordered her at juxta lite stocking, however she apparently has not been able to wear it. The area on the medial aspect of her left ankle has much less induration and erythema. The edema is better controlled. No evidence of infection is noted. 07/18/14; no major change in the condition of this wound. There is still surface slough and probably some drainage. 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 All other veins showed no evidence of DVT, superficial venous thrombosis or incompetence. however she does have manifestations of chronic venous hypertension and I believe we will change her dressing over to Prisma and a Profore light dressing. she sees her primary care doctor regularly and her last hemoglobin A1c has been around 7%.her other comorbidities are hypertension, CABG in 1998, peptic ulcer disease, history of echo with a ejection fraction of 55-60% and grade 1 diastolic dysfunction. past surgical history include coronary artery bypass graft in 1998, cholecystectomy, appendectomy, hand surgery, endovenous ablation of the saphenous vein with laser by Dr. Kellie Simmering in  December 2015. SANIYA, LAFONT ([id number]) 08/29/2014 - she complains of increased calf pain over the past week. removed her compression bandage and pain improved. no fever or chills. Mild clear drainage. 09/04/2014 -- she has not been compliant and has not done a dressing change as we had ordered last week. She always complains and does not keep her wrap on and this past week she has done the same. She has completed a course of doxycycline. 09/25/2014 -- she has tolerated her dressing all week and has not removed it. She says it's hurting a lot today and would not like any debridement. We will do so as per her wishes. she did get a appointment with the vascular surgeon and sees him on 6/28. 10/03/2014 - She was seen by Dr. Tinnie Gens 09/30/2014. Assessment: I reviewed the previous venous duplex exam study performed in September 2015 which reveals no options for treating this venous ulcer. There is no significant superficial venous reflux to treat with laser ablation.Most recent ABI was 0.91 in the right foot with palpable dorsalis pedis pulse at 3+ Plan: patient needs to return to the [place] wound center and be treated by Dr. Con Memos. No arterial or venous options available from vascular standpoint or treatment of right leg stasis ulcer.Continued dressing changes and compression is the treatment of choice 10/16/2014 -- today will be her first application of Apligraf. 10/23/2014 -- we are going to be able to apply her second day of Apligraf next week. She is complaining of some tenderness in the region of Achilles tendon and this is possibly due to her wrap. 11/06/2014 -- she did not have Apligraf last week because the wound was not looking good but she says she is feeling much better now. 11/13/2014 -- she has been doing well this week and she is here for her second Apligraf. 11/27/2014 -- she is here for her third application of Apligraf 12/12/2014 -- she is here for her fourth  application of Apligraf. Also during these past 3 days she had an injury to her left forearm which she caught against the edge of her desk and has a lacerated wound which has gone down to the subcutaneous tissue. 12/26/2014 -- she is here for a last/fifth application of  Apligraf. Objective Constitutional Pulse regular. Respirations normal and unlabored. Afebrile. Vitals Time Taken: 1:18 PM, Height: 58 in, Weight: 151 lbs, BMI: 31.6, Temperature: 98.1 F, Pulse: 84 bpm, Respiratory Rate: 18 breaths/min, Blood Pressure: 98/43 mmHg. Eyes Nonicteric. Reactive to light. Ears, Nose, Mouth, and Throat Labine, Oneta C. ([id number]) Lips, teeth, and gums WNL.Marland Kitchen Moist mucosa without lesions . Neck supple and nontender. No palpable supraclavicular or cervical adenopathy. Normal sized without goiter. Respiratory WNL. No retractions.. Cardiovascular Pedal Pulses WNL. No clubbing, cyanosis or edema. Lymphatic No adneopathy. No adenopathy. No adenopathy. Musculoskeletal Adexa without tenderness or enlargement.. Digits and nails w/o clubbing, cyanosis, infection, petechiae, ischemia, or inflammatory conditions.Marland Kitchen Psychiatric Judgement and insight Intact.. No evidence of depression, anxiety, or agitation.. General Notes: the wound on the medial part of the right lower extremity has minimal debris which was washed out with moist saline gauze. Integumentary (Hair, Skin) No suspicious lesions. No crepitus or fluctuance. No peri-wound warmth or erythema. No masses.. Wound #1 status is Open. Original cause of wound was Gradually Appeared. The wound is located on the Right,Medial Lower Leg. The wound measures 4.5cm length x 1.5cm width x 0.1cm depth; 5.301cm^2 area and 0.53cm^3 volume. The wound is limited to skin breakdown. There is a medium amount of serosanguineous drainage noted. The wound margin is distinct with the outline attached to the wound base. There is large (67-100%) red granulation within  the wound bed. There is no necrotic tissue within the wound bed. The periwound skin appearance exhibited: Moist, Mottled. The periwound skin appearance did not exhibit: Callus, Crepitus, Excoriation, Fluctuance, Friable, Induration, Localized Edema, Rash, Scarring, Dry/Scaly, Maceration, Atrophie Blanche, Cyanosis, Ecchymosis, Hemosiderin Staining, Pallor, Rubor, Erythema. Periwound temperature was noted as No Abnormality. The periwound has tenderness on palpation. Assessment Active Problems ICD-10 E11.622 - Type 2 diabetes mellitus with other skin ulcer E11.610 - Type 2 diabetes mellitus with diabetic neuropathic arthropathy I87.331 - Chronic venous hypertension (idiopathic) with ulcer and inflammation of right lower extremity Lanpher, Maryrose C. ([id number]) L03.115 - Cellulitis of right lower limb S41.112A - Laceration without foreign body of left upper arm, initial encounter Procedures We will use a piece of Sorbact and then Sorbion for exudate management. she will then have a 2 layer compression wrap applied over this. Plan Wound Cleansing: Wound #1 Right,Medial Lower Leg: Cleanse wound with mild soap and water Anesthetic: Wound #1 Right,Medial Lower Leg: Topical Lidocaine 4% cream applied to wound bed prior to debridement Primary Wound Dressing: Wound #1 Right,Medial Lower Leg: Other: - Sorbact and Sorbion for drainage Dressing Change Frequency: Wound #1 Right,Medial Lower Leg: Change dressing every week Follow-up Appointments: Wound #1 Right,Medial Lower Leg: Return Appointment in 1 week. Edema Control: Wound #1 Right,Medial Lower Leg: 2 Layer Compression System - Right Lower Extremity We will use a piece of Sorbact and then Sorbion for exudate management. she will then have a 2 layer compression wrap applied over this. Electronic Signature(s) Signed: 01/29/2015 1:34:33 PM By: Christin Fudge MD, FACS Entered By: Christin Fudge on 01/29/2015 13:34:33 Brockman, [name]  ([id number]) Aloi, [name] ([id number]) -------------------------------------------------------------------------------- SuperBill Details Patient Name: Pike, Ronia C. Date of Service: 01/29/2015 Medical Record Number: [id number] Patient Account Number: [id number] Date of Birth/Sex: 11/04/1927 (81 y.o. Female) Treating RN: Cornell Barman Primary Care Physician: Ria Bush Other Clinician: Referring Physician: Ria Bush Treating Physician/Extender: Frann Rider in Treatment: 23 Diagnosis Coding ICD-10 Codes Code Description E11.622 Type 2 diabetes mellitus with other skin ulcer E11.610 Type 2 diabetes mellitus with diabetic neuropathic arthropathy Chronic  venous hypertension (idiopathic) with ulcer and inflammation of right lower I87.331 extremity L03.115 Cellulitis of right lower limb S41.112A Laceration without foreign body of left upper arm, initial encounter Facility Procedures CPT4: Description Modifier Quantity Code [id number] (Facility Use Only) [id number] - [facility] LT 1 LEG Physician Procedures CPT4: Description Modifier Quantity Code [id number] - WC PHYS LEVEL 3 - EST PT 1 ICD-10 Description Diagnosis E11.622 Type 2 diabetes mellitus with other skin ulcer E11.610 Type 2 diabetes mellitus with diabetic neuropathic arthropathy I87.331 Chronic  venous hypertension (idiopathic) with ulcer and inflammation of right lower extremity L03.115 Cellulitis of right lower limb Electronic Signature(s) Signed: 01/29/2015 3:59:19 PM By: Christin Fudge MD, FACS Signed: 01/29/2015 6:06:11 PM By: Gretta Cool RN, BSN, Kim RN, BSN Previous Signature: 01/29/2015 1:34:58 PM Version By: Christin Fudge MD, FACS Entered By: Gretta Cool, RN, BSN, Kim on 01/29/2015 13:41:15

## 2015-02-06 ENCOUNTER — Encounter: Payer: Medicare Other | Attending: Surgery | Admitting: Surgery

## 2015-02-06 DIAGNOSIS — X58XXXA Exposure to other specified factors, initial encounter: Secondary | ICD-10-CM | POA: Insufficient documentation

## 2015-02-06 DIAGNOSIS — E11610 Type 2 diabetes mellitus with diabetic neuropathic arthropathy: Secondary | ICD-10-CM | POA: Insufficient documentation

## 2015-02-06 DIAGNOSIS — L03115 Cellulitis of right lower limb: Secondary | ICD-10-CM | POA: Insufficient documentation

## 2015-02-06 DIAGNOSIS — S41112A Laceration without foreign body of left upper arm, initial encounter: Secondary | ICD-10-CM | POA: Insufficient documentation

## 2015-02-06 DIAGNOSIS — I87331 Chronic venous hypertension (idiopathic) with ulcer and inflammation of right lower extremity: Secondary | ICD-10-CM | POA: Diagnosis not present

## 2015-02-06 DIAGNOSIS — L97211 Non-pressure chronic ulcer of right calf limited to breakdown of skin: Secondary | ICD-10-CM | POA: Diagnosis not present

## 2015-02-06 DIAGNOSIS — E11622 Type 2 diabetes mellitus with other skin ulcer: Secondary | ICD-10-CM | POA: Diagnosis not present

## 2015-02-06 DIAGNOSIS — I10 Essential (primary) hypertension: Secondary | ICD-10-CM | POA: Insufficient documentation

## 2015-02-06 NOTE — Progress Notes (Addendum)
DAVIA, LAUTZENHEISER ([id number]) Visit Report for 02/06/2015 Chief Complaint Document Details Patient Name: [name], [name] Date of Service: 02/06/2015 3:00 PM Medical Record Number: [id number] Patient Account Number: [id number] Date of Birth/Sex: 1927/04/12 (81 y.o. Female) Treating RN: Cornell Barman Primary Care Physician: Ria Bush Other Clinician: Referring Physician: Ria Bush Treating Physician/Extender: Frann Rider in Treatment: 25 Information Obtained from: Patient Chief Complaint Patient presents for treatment of an open ulcer due to venous insufficiency. 81 year old patient who is known to me from the [facility] wound care center now comes in follow up here for a chronic venous ulceration of her right lower extremity which she's had for about 6 months. Electronic Signature(s) Signed: 02/06/2015 3:19:33 PM By: Christin Fudge MD, FACS Entered By: Christin Fudge on 02/06/2015 15:19:33 Kinkead, [name] ([id number]) -------------------------------------------------------------------------------- HPI Details Patient Name: [name], [name] Date of Service: 02/06/2015 3:00 PM Medical Record Number: [id number] Patient Account Number: [id number] Date of Birth/Sex: Apr 10, 1927 (81 y.o. Female) Treating RN: Cornell Barman Primary Care Physician: Ria Bush Other Clinician: Referring Physician: Ria Bush Treating Physician/Extender: Frann Rider in Treatment: 25 History of Present Illness Location: ulcer in the right lower extremity for several months Quality: Patient reports experiencing a dull pain to affected area(s). Severity: Patient states wound (s) are getting better. Duration: Patient has had the wound for > 3 months prior to seeking treatment at the wound center Timing: Pain in wound is Intermittent (comes and goes Context: The wound appeared gradually over time Modifying Factors: Consults to this date include: vascular surgery for the  veins in December 2015 and she's been seeing the wound care center at [facility] for several months. Associated Signs and Symptoms: Patient reports having difficulty standing for long periods. HPI Description: This is an 81 yrs old female we have been following since the beginning of January for a wound on the right medial ankle. She had previously had venous ablation of the greater saphenous vein by Dr. Kellie Simmering. She came to [place] with erythema of the right leg and subsequently is opened in the medial aspect of the right ankle. She has not been very compliant with compression. We ordered her at juxta lite stocking, however she apparently has not been able to wear it. The area on the medial aspect of her left ankle has much less induration and erythema. The edema is better controlled. No evidence of infection is noted. 07/18/14; no major change in the condition of this wound. There is still surface slough and probably some drainage. 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 All other veins showed no evidence of DVT, superficial venous thrombosis or incompetence. however she does have manifestations of chronic venous hypertension and I believe we will change her dressing over to Prisma and a Profore light dressing. she sees her primary care doctor regularly and her last hemoglobin A1c has been around 7%.her other comorbidities are hypertension, CABG in 1998, peptic ulcer disease, history of echo with a ejection fraction of 55-60% and grade 1 diastolic dysfunction. past surgical history include coronary artery bypass graft in 1998, cholecystectomy, appendectomy, hand surgery, endovenous ablation of the saphenous vein with laser by Dr. Kellie Simmering in December 2015. 08/29/2014 - she complains of increased calf pain over the past week. removed her compression bandage and pain improved. no  fever or chills. Mild clear drainage. 09/04/2014 -- she has not been compliant and  has not done a dressing change as we had ordered last week. She always complains and does not keep her wrap on and this past week she has done the same. She has completed a course of doxycycline. 09/25/2014 -- she has tolerated her dressing all week and has not removed it. She says it's hurting a lot today and would not like any debridement. We will do so as per her wishes. she did get a appointment with the vascular surgeon and sees him on 6/28. [name], [name] 10/03/2014 - She was seen by Dr. Tinnie Gens 09/30/2014. Assessment: I reviewed the previous venous duplex exam study performed in September 2015 which reveals no options for treating this venous ulcer. There is no significant superficial venous reflux to treat with laser ablation.Most recent ABI was 0.91 in the right foot with palpable dorsalis pedis pulse at 3+ Plan: patient needs to return to the [place] wound center and be treated by Dr. Con Memos. No arterial or venous options available from vascular standpoint or treatment of right leg stasis ulcer.Continued dressing changes and compression is the treatment of choice 10/16/2014 -- today will be her first application of Apligraf. 10/23/2014 -- we are going to be able to apply her second day of Apligraf next week. She is complaining of some tenderness in the region of Achilles tendon and this is possibly due to her wrap. 11/06/2014 -- she did not have Apligraf last week because the wound was not looking good but she says she is feeling much better now. 11/13/2014 -- she has been doing well this week and she is here for her second Apligraf. 11/27/2014 -- she is here for her third application of Apligraf 12/12/2014 -- she is here for her fourth application of Apligraf. Also during these past 3 days she had an injury to her left forearm which she caught against the edge of her desk and  has a lacerated wound which has gone down to the subcutaneous tissue. 12/26/2014 -- she is here for a last/fifth application of Apligraf. Electronic Signature(s) Signed: 02/06/2015 3:19:39 PM By: Christin Fudge MD, FACS Entered By: Christin Fudge on 02/06/2015 15:19:39 Sebastiano, [name] -------------------------------------------------------------------------------- Physical Exam Details Patient Name: Chmiel, Tomoko [name]. Date of Service: 02/06/2015 3:00 PM Medical Record Number: [id number] Patient Account Number: [id number] Date of Birth/Sex: 07/10/1927 (81 y.o. Female) Treating RN: Cornell Barman Primary Care Physician: Ria Bush Other Clinician: Referring Physician: Ria Bush Treating Physician/Extender: Frann Rider in Treatment: 25 Constitutional . Pulse regular. Respirations normal and unlabored. Afebrile. . Eyes Nonicteric. Reactive to light. Ears, Nose, Mouth, and Throat Lips, teeth, and gums WNL.Marland Kitchen Moist mucosa without lesions . Neck supple and nontender. No palpable supraclavicular or cervical adenopathy. Normal sized without goiter. Respiratory WNL. No retractions.. Cardiovascular Pedal Pulses WNL. No clubbing, cyanosis or edema. Lymphatic No adneopathy. No adenopathy. No adenopathy. Musculoskeletal Adexa without tenderness or enlargement.. Digits and nails w/o clubbing, cyanosis, infection, petechiae, ischemia, or inflammatory conditions.. Integumentary (Hair, Skin) No suspicious lesions. No crepitus or fluctuance. No peri-wound warmth or erythema. No masses.Marland Kitchen Psychiatric Judgement and insight Intact.. No evidence of depression, anxiety, or agitation.. Notes the wound is slightly macerated and the base of the wound has some superficial debris which will be washed with saline gauze. Electronic Signature(s) Signed: 02/06/2015 3:20:15 PM By: Christin Fudge MD, FACS Entered By: Christin Fudge on 02/06/2015 15:20:15 Cardosa, [name]  ([id number]) -------------------------------------------------------------------------------- Physician Orders Details Patient Name: [name], [name] Date of Service: 02/06/2015 3:00 PM Medical Record Number:  [id number] Patient Account Number: [id number] Date of Birth/Sex: 10/23/1927 (81 y.o. Female) Treating RN: Cornell Barman Primary Care Physician: Ria Bush Other Clinician: Referring Physician: Ria Bush Treating Physician/Extender: Frann Rider in Treatment: 25 Verbal / Phone Orders: Yes Clinician: Cornell Barman Read Back and Verified: Yes Diagnosis Coding ICD-10 Coding Code Description E11.622 Type 2 diabetes mellitus with other skin ulcer E11.610 Type 2 diabetes mellitus with diabetic neuropathic arthropathy Chronic venous hypertension (idiopathic) with ulcer and inflammation of right lower I87.331 extremity L03.115 Cellulitis of right lower limb S41.112A Laceration without foreign body of left upper arm, initial encounter Wound Cleansing Wound #1 Right,Medial Lower Leg o Cleanse wound with mild soap and water Anesthetic Wound #1 Right,Medial Lower Leg o Topical Lidocaine 4% cream applied to wound bed prior to debridement Primary Wound Dressing Wound #1 Right,Medial Lower Leg o Other: - Siltec Sorbact Dressing Change Frequency Wound #1 Right,Medial Lower Leg o Change dressing every week Follow-up Appointments Wound #1 Right,Medial Lower Leg o Return Appointment in 1 week. Edema Control Wound #1 Right,Medial Lower Leg o 2 Layer Compression System - Right Lower Extremity AMILIAH, MCLAIN ([id number]) Electronic Signature(s) Signed: 02/06/2015 3:45:45 PM By: Christin Fudge MD, FACS Signed: 02/06/2015 4:42:28 PM By: Gretta Cool RN, BSN, Kim RN, BSN Entered By: Gretta Cool, RN, BSN, Kim on 02/06/2015 15:30:14 Javier, [name] -------------------------------------------------------------------------------- Problem List Details Patient  Name: [name], [name] Date of Service: 02/06/2015 3:00 PM Medical Record Number: [id number] Patient Account Number: [id number] Date of Birth/Sex: March 12, 1928 (81 y.o. Female) Treating RN: Cornell Barman Primary Care Physician: Ria Bush Other Clinician: Referring Physician: Ria Bush Treating Physician/Extender: Frann Rider in Treatment: 25 Active Problems ICD-10 Encounter Code Description Active Date Diagnosis E11.622 Type 2 diabetes mellitus with other skin ulcer 08/15/2014 Yes E11.610 Type 2 diabetes mellitus with diabetic neuropathic 08/15/2014 Yes arthropathy I87.331 Chronic venous hypertension (idiopathic) with ulcer and 08/15/2014 Yes inflammation of right lower extremity L03.115 Cellulitis of right lower limb 08/29/2014 Yes S41.112A Laceration without foreign body of left upper arm, initial 12/12/2014 Yes encounter Inactive Problems Resolved Problems Electronic Signature(s) Signed: 02/06/2015 3:19:25 PM By: Christin Fudge MD, FACS Entered By: Christin Fudge on 02/06/2015 15:19:25 Spadoni, [name] -------------------------------------------------------------------------------- Progress Note Details Patient Name: [name], [name] Date of Service: 02/06/2015 3:00 PM Medical Record Number: [id number] Patient Account Number: [id number] Date of Birth/Sex: 05/19/1927 (81 y.o. Female) Treating RN: Cornell Barman Primary Care Physician: Ria Bush Other Clinician: Referring Physician: Ria Bush Treating Physician/Extender: Frann Rider in Treatment: 25 Subjective Chief Complaint Information obtained from Patient Patient presents for treatment of an open ulcer due to venous insufficiency. 81 year old patient who is known to me from the [facility] wound care center now comes in follow up here for a chronic venous ulceration of her right lower extremity which she's had for about 6 months. History of Present Illness (HPI) The following  HPI elements were documented for the patient's wound: Location: ulcer in the right lower extremity for several months Quality: Patient reports experiencing a dull pain to affected area(s). Severity: Patient states wound (s) are getting better. Duration: Patient has had the wound for > 3 months prior to seeking treatment at the wound center Timing: Pain in wound is Intermittent (comes and goes Context: The wound appeared gradually over time Modifying Factors: Consults to this date include: vascular surgery for the veins in December 2015 and she's been seeing the wound care center at [facility] for several months. Associated Signs and Symptoms: Patient reports having difficulty standing for  long periods. This is an 81 yrs old female we have been following since the beginning of January for a wound on the right medial ankle. She had previously had venous ablation of the greater saphenous vein by Dr. Kellie Simmering. She came to [place] with erythema of the right leg and subsequently is opened in the medial aspect of the right ankle. She has not been very compliant with compression. We ordered her at juxta lite stocking, however she apparently has not been able to wear it. The area on the medial aspect of her left ankle has much less induration and erythema. The edema is better controlled. No evidence of infection is noted. 07/18/14; no major change in the condition of this wound. There is still surface slough and probably some drainage. 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 All other veins showed no evidence of DVT, superficial venous thrombosis or incompetence. however she does have manifestations of chronic venous hypertension and I believe we will change her dressing over to Prisma and a Profore light dressing. she sees her primary care doctor regularly and her last hemoglobin A1c has  been around 7%.her other comorbidities are hypertension, CABG in 1998, peptic ulcer disease, history of echo with a ejection fraction of 55-60% and grade 1 diastolic dysfunction. past surgical history include coronary artery bypass graft in 1998, cholecystectomy, appendectomy, hand surgery, endovenous ablation of the saphenous vein with laser by Dr. Kellie Simmering in December 2015. [name], [name] ([id number]) 08/29/2014 - she complains of increased calf pain over the past week. removed her compression bandage and pain improved. no fever or chills. Mild clear drainage. 09/04/2014 -- she has not been compliant and has not done a dressing change as we had ordered last week. She always complains and does not keep her wrap on and this past week she has done the same. She has completed a course of doxycycline. 09/25/2014 -- she has tolerated her dressing all week and has not removed it. She says it's hurting a lot today and would not like any debridement. We will do so as per her wishes. she did get a appointment with the vascular surgeon and sees him on 6/28. 10/03/2014 - She was seen by Dr. Tinnie Gens 09/30/2014. Assessment: I reviewed the previous venous duplex exam study performed in September 2015 which reveals no options for treating this venous ulcer. There is no significant superficial venous reflux to treat with laser ablation.Most recent ABI was 0.91 in the right foot with palpable dorsalis pedis pulse at 3+ Plan: patient needs to return to the [place] wound center and be treated by Dr. Con Memos. No arterial or venous options available from vascular standpoint or treatment of right leg stasis ulcer.Continued dressing changes and compression is the treatment of choice 10/16/2014 -- today will be her first application of Apligraf. 10/23/2014 -- we are going to be able to apply her second day of Apligraf next week. She is complaining of some tenderness in the region of Achilles tendon and this is  possibly due to her wrap. 11/06/2014 -- she did not have Apligraf last week because the wound was not looking good but she says she is feeling much better now. 11/13/2014 -- she has been doing well this week and she is here for her second Apligraf. 11/27/2014 -- she is here for her third application of Apligraf 12/12/2014 -- she is  here for her fourth application of Apligraf. Also during these past 3 days she had an injury to her left forearm which she caught against the edge of her desk and has a lacerated wound which has gone down to the subcutaneous tissue. 12/26/2014 -- she is here for a last/fifth application of Apligraf. Objective Constitutional Pulse regular. Respirations normal and unlabored. Afebrile. Vitals Time Taken: 3:04 PM, Height: 58 in, Weight: 151 lbs, BMI: 31.6, Temperature: 97.9 F, Pulse: 77 bpm, Respiratory Rate: 18 breaths/min, Blood Pressure: 130/52 mmHg. Eyes Nonicteric. Reactive to light. Ears, Nose, Mouth, and Throat [name], [name] ([id number]) Lips, teeth, and gums WNL.Marland Kitchen Moist mucosa without lesions . Neck supple and nontender. No palpable supraclavicular or cervical adenopathy. Normal sized without goiter. Respiratory WNL. No retractions.. Cardiovascular Pedal Pulses WNL. No clubbing, cyanosis or edema. Lymphatic No adneopathy. No adenopathy. No adenopathy. Musculoskeletal Adexa without tenderness or enlargement.. Digits and nails w/o clubbing, cyanosis, infection, petechiae, ischemia, or inflammatory conditions.Marland Kitchen Psychiatric Judgement and insight Intact.. No evidence of depression, anxiety, or agitation.. General Notes: the wound is slightly macerated and the base of the wound has some superficial debris which will be washed with saline gauze. Integumentary (Hair, Skin) No suspicious lesions. No crepitus or fluctuance. No peri-wound warmth or erythema. No masses.. Wound #1 status is Open. Original cause of wound was Gradually Appeared. The wound is  located on the Right,Medial Lower Leg. The wound measures 4cm length x 1cm width x 0.1cm depth; 3.142cm^2 area and 0.314cm^3 volume. The wound is limited to skin breakdown. There is a medium amount of serosanguineous drainage noted. The wound margin is distinct with the outline attached to the wound base. There is large (67-100%) red granulation within the wound bed. There is a small (1-33%) amount of necrotic tissue within the wound bed including Adherent Slough. The periwound skin appearance exhibited: Moist, Mottled. The periwound skin appearance did not exhibit: Callus, Crepitus, Excoriation, Fluctuance, Friable, Induration, Localized Edema, Rash, Scarring, Dry/Scaly, Maceration, Atrophie Blanche, Cyanosis, Ecchymosis, Hemosiderin Staining, Pallor, Rubor, Erythema. Periwound temperature was noted as No Abnormality. The periwound has tenderness on palpation. Assessment Active Problems ICD-10 E11.622 - Type 2 diabetes mellitus with other skin ulcer E11.610 - Type 2 diabetes mellitus with diabetic neuropathic arthropathy I87.331 - Chronic venous hypertension (idiopathic) with ulcer and inflammation of right lower extremity [name], [name] ([id number]) L03.115 - Cellulitis of right lower limb S41.112A - Laceration without foreign body of left upper arm, initial encounter We will use a piece of Cutimed Siltec Sorbact and then a bolster. She will then have a 2 layer compression wrap applied over this. Plan Wound Cleansing: Wound #1 Right,Medial Lower Leg: Cleanse wound with mild soap and water Anesthetic: Wound #1 Right,Medial Lower Leg: Topical Lidocaine 4% cream applied to wound bed prior to debridement Primary Wound Dressing: Wound #1 Right,Medial Lower Leg: Other: - Siltec Sorbact Dressing Change Frequency: Wound #1 Right,Medial Lower Leg: Change dressing every week Follow-up Appointments: Wound #1 Right,Medial Lower Leg: Return Appointment in 1 week. Edema Control: Wound #1  Right,Medial Lower Leg: 2 Layer Compression System - Right Lower Extremity We will use a piece of Cutimed Siltec Sorbact and then a bolster. She will then have a 2 layer compression wrap applied over this. Electronic Signature(s) Signed: 02/06/2015 3:50:16 PM By: Christin Fudge MD, FACS Previous Signature: 02/06/2015 3:21:57 PM Version By: Christin Fudge MD, FACS [name], [name] ([id number]) Entered By: Christin Fudge on 02/06/2015 15:50:16 [name], [name] ([id number]) -------------------------------------------------------------------------------- SuperBill Details Patient Name: [name], [name]  Date of Service: 02/06/2015 Medical Record Number: [id number] Patient Account Number: [id number] Date of Birth/Sex: [birth year] (80 y.o. Female) Treating RN: Cornell Barman Primary Care Physician: Ria Bush Other Clinician: Referring Physician: Ria Bush Treating Physician/Extender: Frann Rider in Treatment: 25 Diagnosis Coding ICD-10 Codes Code Description E11.622 Type 2 diabetes mellitus with other skin ulcer E11.610 Type 2 diabetes mellitus with diabetic neuropathic arthropathy Chronic venous hypertension (idiopathic) with ulcer and inflammation of right lower I87.331 extremity L03.115 Cellulitis of right lower limb S41.112A Laceration without foreign body of left upper arm, initial encounter Facility Procedures CPT4: Description Modifier Quantity Code [id number] (Facility Use Only) [phone] - APPLY [place] RT 1 LEG Physician Procedures CPT4: Description Modifier Quantity Code [id number] - WC PHYS LEVEL 3 - EST PT 1 ICD-10 Description Diagnosis E11.622 Type 2 diabetes mellitus with other skin ulcer E11.610 Type 2 diabetes mellitus with diabetic neuropathic arthropathy I87.331 Chronic  venous hypertension (idiopathic) with ulcer and inflammation of right lower extremity L03.115 Cellulitis of right lower limb Electronic Signature(s) Signed: 02/06/2015 3:45:45  PM By: Christin Fudge MD, FACS Signed: 02/06/2015 4:42:28 PM By: Gretta Cool RN, BSN, Kim RN, BSN Previous Signature: 02/06/2015 3:22:12 PM Version By: Christin Fudge MD, FACS Entered By: Gretta Cool, RN, BSN, Kim on 02/06/2015 15:29:11

## 2015-02-07 NOTE — Progress Notes (Signed)
ALLURE, IGOE ([id number]) Visit Report for 02/06/2015 Arrival Information Details Patient Name: [name], [name] Date of Service: 02/06/2015 3:00 PM Medical Record Number: [id number] Patient Account Number: [id number] Date of Birth/Sex: 1928-01-11 (81 y.o. Female) Treating RN: Cornell Barman Primary Care Physician: Ria Bush Other Clinician: Referring Physician: Ria Bush Treating Physician/Extender: Frann Rider in Treatment: 25 Visit Information History Since Last Visit Added or deleted any medications: No Patient Arrived: Walker Any new allergies or adverse reactions: No Arrival Time: 15:03 Had a fall or experienced change in No Accompanied By: self activities of daily living that may affect Transfer Assistance: None risk of falls: Patient Identification Verified: Yes Signs or symptoms of abuse/neglect since last No Secondary Verification Process Yes visito Completed: Hospitalized since last visit: No Patient Has Alerts: Yes Has Dressing in Place as Prescribed: Yes Patient Alerts: Patient on Blood Has Compression in Place as Prescribed: Yes Thinner Pain Present Now: No Type II Diabetic 81 MG aspirin Electronic Signature(s) Signed: 02/06/2015 4:42:28 PM By: Gretta Cool, RN, BSN, Kim RN, BSN Entered By: Gretta Cool, RN, BSN, Kim on 02/06/2015 15:03:56 Haskew, [name] ([id number]) -------------------------------------------------------------------------------- Encounter Discharge Information Details Patient Name: [name], [name] Date of Service: 02/06/2015 3:00 PM Medical Record Number: [id number] Patient Account Number: [id number] Date of Birth/Sex: [birth year] (81 y.o. Female) Treating RN: Cornell Barman Primary Care Physician: Ria Bush Other Clinician: Referring Physician: Ria Bush Treating Physician/Extender: Frann Rider in Treatment: 25 Encounter Discharge Information Items Discharge Pain Level: 0 Discharge Condition:  Stable Ambulatory Status: Walker Discharge Destination: Home Transportation: Private Auto Accompanied By: son Schedule Follow-up Appointment: Yes Medication Reconciliation completed Yes and provided to Patient/Care Jackelyn Illingworth: Provided on Clinical Summary of Care: 02/06/2015 Form Type Recipient Paper Patient LL Electronic Signature(s) Signed: 02/06/2015 4:42:28 PM By: Gretta Cool RN, BSN, Kim RN, BSN Previous Signature: 02/06/2015 3:26:21 PM Version By: Ruthine Dose Entered By: Gretta Cool RN, BSN, Kim on 02/06/2015 15:30:37 Howk, [name] ([id number]) -------------------------------------------------------------------------------- Lower Extremity Assessment Details Patient Name: Garland, [name] Date of Service: 02/06/2015 3:00 PM Medical Record Number: [id number] Patient Account Number: [id number] Date of Birth/Sex: 1927-05-07 (81 y.o. Female) Treating RN: Cornell Barman Primary Care Physician: Ria Bush Other Clinician: Referring Physician: Ria Bush Treating Physician/Extender: Frann Rider in Treatment: 25 Edema Assessment Assessed: [Left: No] [Right: No] E[Left: dema] [Right: :] Calf Left: Right: Point of Measurement: cm From Medial Instep cm 26 cm Ankle Left: Right: Point of Measurement: cm From Medial Instep cm 20.2 cm Vascular Assessment Pulses: Posterior Tibial Dorsalis Pedis Palpable: [Right:Yes] Extremity colors, hair growth, and conditions: Extremity Color: [Right:Mottled] Hair Growth on Extremity: [Right:No] Temperature of Extremity: [Right:Warm] Capillary Refill: [Right:< 3 seconds] Toe Nail Assessment Left: Right: Thick: No Discolored: No Deformed: No Improper Length and Hygiene: No Electronic Signature(s) Signed: 02/06/2015 4:42:28 PM By: Gretta Cool, RN, BSN, Kim RN, BSN Entered By: Gretta Cool, RN, BSN, Kim on 02/06/2015 15:08:05 Pannone, [name]  ([id number]) -------------------------------------------------------------------------------- Multi Wound Chart Details Patient Name: [name], [name] Date of Service: 02/06/2015 3:00 PM Medical Record Number: [id number] Patient Account Number: [id number] Date of Birth/Sex: [birth year] (81 y.o. Female) Treating RN: Cornell Barman Primary Care Physician: Ria Bush Other Clinician: Referring Physician: Ria Bush Treating Physician/Extender: Frann Rider in Treatment: 25 Vital Signs Height(in): 58 Pulse(bpm): 77 Weight(lbs): 151 Blood Pressure 130/52 (mmHg): Body Mass Index(BMI): 32 Temperature(F): 97.9 Respiratory Rate 18 (breaths/min): Photos: [1:No Photos] [N/A:N/A] Wound Location: [1:Right Lower Leg - Medial] [N/A:N/A] Wounding Event: [1:Gradually Appeared] [N/A:N/A] Primary Etiology: [1:Venous Leg Ulcer] [N/A:N/A] Comorbid History: [1:Cataracts, Anemia,  Coronary Artery Disease, Hypertension, Myocardial Infarction, Type II Diabetes, Osteoarthritis] [N/A:N/A] Date Acquired: [1:04/04/2014] [N/A:N/A] Weeks of Treatment: [1:25] [N/A:N/A] Wound Status: [1:Open] [N/A:N/A] Measurements L x W x D 4x1x0.1 [N/A:N/A] (cm) Area (cm) : [1:3.142] [N/A:N/A] Volume (cm) : [1:0.314] [N/A:N/A] % Reduction in Area: [1:46.70%] [N/A:N/A] % Reduction in Volume: 82.20% [N/A:N/A] Classification: [1:Full Thickness Without Exposed Support Structures] [N/A:N/A] HBO Classification: [1:Grade 1] [N/A:N/A] Exudate Amount: [1:Medium] [N/A:N/A] Exudate Type: [1:Serosanguineous] [N/A:N/A] Exudate Color: [1:red, brown] [N/A:N/A] Foul Odor After [1:Yes] [N/A:N/A] Cleansing: Odor Anticipated Due to No [N/A:N/A] Product Use: Wound Margin: [1:Distinct, outline attached] [N/A:N/A] Granulation Amount: Large (67-100%) N/A N/A Granulation Quality: Red N/A N/A Necrotic Amount: Small (1-33%) N/A N/A Exposed Structures: Fascia: No N/A N/A Fat: No Tendon: No Muscle: No Joint: No Bone:  No Limited to Skin Breakdown Epithelialization: Small (1-33%) N/A N/A Periwound Skin Texture: Edema: No N/A N/A Excoriation: No Induration: No Callus: No Crepitus: No Fluctuance: No Friable: No Rash: No Scarring: No Periwound Skin Moist: Yes N/A N/A Moisture: Maceration: No Dry/Scaly: No Periwound Skin Color: Mottled: Yes N/A N/A Atrophie Blanche: No Cyanosis: No Ecchymosis: No Erythema: No Hemosiderin Staining: No Pallor: No Rubor: No Temperature: No Abnormality N/A N/A Tenderness on Yes N/A N/A Palpation: Wound Preparation: Ulcer Cleansing: Other: N/A N/A soap and water Topical Anesthetic Applied: Other: lidocaine 4% Treatment Notes Electronic Signature(s) Signed: 02/06/2015 4:42:28 PM By: Gretta Cool, RN, BSN, Kim RN, BSN Entered By: Gretta Cool, RN, BSN, Kim on 02/06/2015 15:11:53 Fohl, [name] ([id number]) -------------------------------------------------------------------------------- [place] Details Patient Name: Browe, Vonette C. Date of Service: 02/06/2015 3:00 PM Medical Record Number: [id number] Patient Account Number: [id number] Date of Birth/Sex: 05-Feb-1928 (81 y.o. Female) Treating RN: Cornell Barman Primary Care Physician: Ria Bush Other Clinician: Referring Physician: Ria Bush Treating Physician/Extender: Frann Rider in Treatment: 25 Active Inactive Abuse / Safety / Falls / Self Care Management Nursing Diagnoses: Potential for falls Goals: Patient will remain injury free Date Initiated: 08/15/2014 Goal Status: Active Interventions: Assess fall risk on admission and as needed Notes: Nutrition Nursing Diagnoses: Impaired glucose control: actual or potential Goals: Patient/caregiver verbalizes understanding of need to maintain therapeutic glucose control per primary care physician Date Initiated: 08/15/2014 Goal Status: Active Interventions: Assess patient nutrition upon admission and as needed per  policy Notes: Orientation to the Wound Care Program Nursing Diagnoses: Knowledge deficit related to the wound healing center program Goals: Patient/caregiver will verbalize understanding of the [place], Kalie C. ([id number]) Date Initiated: 08/15/2014 Goal Status: Active Interventions: Provide education on orientation to the wound center Notes: Venous Leg Ulcer Nursing Diagnoses: Actual venous Insuffiency (use after diagnosis is confirmed) Goals: Patient will maintain optimal edema control Date Initiated: 08/15/2014 Goal Status: Active Interventions: Compression as ordered Treatment Activities: Test ordered outside of clinic : 02/06/2015 Notes: Wound/Skin Impairment Nursing Diagnoses: Impaired tissue integrity Goals: Patient/caregiver will verbalize understanding of skin care regimen Date Initiated: 08/15/2014 Goal Status: Active Interventions: Assess patient/caregiver ability to obtain necessary supplies Notes: Electronic Signature(s) Signed: 02/06/2015 4:42:28 PM By: Gretta Cool, RN, BSN, Kim RN, BSN Entered By: Gretta Cool, RN, BSN, Kim on 02/06/2015 15:11:47 Ramus, [name] ([id number]) -------------------------------------------------------------------------------- Pain Assessment Details Patient Name: Mera, Gennesis C. Date of Service: 02/06/2015 3:00 PM Medical Record Number: [id number] Patient Account Number: [id number] Date of Birth/Sex: 1928-03-31 (81 y.o. Female) Treating RN: Cornell Barman Primary Care Physician: Ria Bush Other Clinician: Referring Physician: Ria Bush Treating Physician/Extender: Frann Rider in Treatment: 25 Active Problems Location of Pain Severity and Description of Pain Patient  Has [name] No Site Locations Pain Management and Medication Current Pain Management: Electronic Signature(s) Signed: 02/06/2015 4:42:28 PM By: Gretta Cool, RN, BSN, Kim RN, BSN Entered By: Gretta Cool, RN, BSN, Kim on 02/06/2015  15:04:02 Camino, [name] ([id number]) -------------------------------------------------------------------------------- Patient/Caregiver Education Details Patient Name: Arrambide, Ora C. Date of Service: 02/06/2015 3:00 PM Medical Record Number: [id number] Patient Account Number: [id number] Date of Birth/Gender: [birth year] (81 y.o. Female) Treating RN: Cornell Barman Primary Care Physician: Ria Bush Other Clinician: Referring Physician: Ria Bush Treating Physician/Extender: Frann Rider in Treatment: 25 Education Assessment Education Provided To: Patient Education Topics Provided Wound/Skin Impairment: Handouts: Other: wound care as prescribed Methods: Demonstration, Explain/Verbal Responses: State content correctly Electronic Signature(s) Signed: 02/06/2015 4:42:28 PM By: Gretta Cool, RN, BSN, Kim RN, BSN Entered By: Gretta Cool, RN, BSN, Kim on 02/06/2015 15:30:52 Kelter, [name] ([id number]) -------------------------------------------------------------------------------- Wound Assessment Details Patient Name: Vessey, Oletta C. Date of Service: 02/06/2015 3:00 PM Medical Record Number: [id number] Patient Account Number: [id number] Date of Birth/Sex: 07/11/1927 (81 y.o. Female) Treating RN: Cornell Barman Primary Care Physician: Ria Bush Other Clinician: Referring Physician: Ria Bush Treating Physician/Extender: Frann Rider in Treatment: 25 Wound Status Wound Number: 1 Primary Venous Leg Ulcer Etiology: Wound Location: Right Lower Leg - Medial Wound Open Wounding Event: Gradually Appeared Status: Date Acquired: 04/04/2014 Comorbid Cataracts, Anemia, Coronary Artery Weeks Of Treatment: 25 History: Disease, Hypertension, Myocardial Clustered Wound: No Infarction, Type II Diabetes, Osteoarthritis Photos Photo Uploaded By: Gretta Cool, RN, BSN, Kim on 02/06/2015 16:51:50 Wound Measurements Length: (cm) 4 Width: (cm) 1 Depth: (cm) 0.1 Area:  (cm) 3.142 Volume: (cm) 0.314 % Reduction in Area: 46.7% % Reduction in Volume: 82.2% Epithelialization: Small (1-33%) Wound Description Full Thickness Without Exposed Foul Odor A Classification: Support Structures Due to Prod Diabetic Severity Grade 1 (Wagner): Wound Margin: Distinct, outline attached Exudate Amount: Medium Exudate Type: Serosanguineous Exudate Color: red, brown fter Cleansing: Yes uct Use: No Wound Bed Strohecker, Tayloranne C. ([id number]) Granulation Amount: Large (67-100%) Exposed Structure Granulation Quality: Red Fascia Exposed: No Necrotic Amount: Small (1-33%) Fat Layer Exposed: No Necrotic Quality: Adherent Slough Tendon Exposed: No Muscle Exposed: No Joint Exposed: No Bone Exposed: No Limited to Skin Breakdown Periwound Skin Texture Texture Color No Abnormalities Noted: No No Abnormalities Noted: No Callus: No Atrophie Blanche: No Crepitus: No Cyanosis: No Excoriation: No Ecchymosis: No Fluctuance: No Erythema: No Friable: No Hemosiderin Staining: No Induration: No Mottled: Yes Localized Edema: No Pallor: No Rash: No Rubor: No Scarring: No Temperature / Pain Moisture Temperature: No Abnormality No Abnormalities Noted: No Tenderness on Palpation: Yes Dry / Scaly: No Maceration: No Moist: Yes Wound Preparation Ulcer Cleansing: Other: soap and water, Topical Anesthetic Applied: Other: lidocaine 4%, Treatment Notes Wound #1 (Right, Medial Lower Leg) 1. Cleansed with: Clean wound with Normal Saline 2. Anesthetic Topical Lidocaine 4% cream to wound bed prior to debridement 4. Dressing Applied: Other dressing (specify in notes) 7. Secured with 2 Layer Compression System - Right Lower Extremity Notes Occupational psychologist) Signed: 02/06/2015 4:42:28 PM By: Gretta Cool, RN, BSN, Kim RN, BSN Budhu, [place] ([id number]) Entered By: Gretta Cool, RN, BSN, Kim on 02/06/2015 15:09:12 Churchill, [name]  ([id number]) -------------------------------------------------------------------------------- Vitals Details Patient Name: Mccalip, Pranathi C. Date of Service: 02/06/2015 3:00 PM Medical Record Number: [id number] Patient Account Number: [id number] Date of Birth/Sex: May 16, 1927 (81 y.o. Female) Treating RN: Cornell Barman Primary Care Physician: Ria Bush Other Clinician: Referring Physician: Ria Bush Treating Physician/Extender: Frann Rider in Treatment: 25 Vital Signs Time Taken: 15:04 Temperature (F): 97.9  Height (in): 58 Pulse (bpm): 77 Weight (lbs): 151 Respiratory Rate (breaths/min): 18 Body Mass Index (BMI): 31.6 Blood Pressure (mmHg): 130/52 Reference Range: 80 - 120 mg / dl Electronic Signature(s) Signed: 02/06/2015 4:42:28 PM By: Gretta Cool, RN, BSN, Kim RN, BSN Entered By: Gretta Cool, RN, BSN, Kim on 02/06/2015 15:04:23

## 2015-02-09 ENCOUNTER — Other Ambulatory Visit: Payer: Self-pay | Admitting: Family Medicine

## 2015-02-12 ENCOUNTER — Encounter: Payer: Self-pay | Admitting: Family Medicine

## 2015-02-13 ENCOUNTER — Encounter: Payer: Medicare Other | Admitting: Surgery

## 2015-02-13 DIAGNOSIS — I87331 Chronic venous hypertension (idiopathic) with ulcer and inflammation of right lower extremity: Secondary | ICD-10-CM | POA: Diagnosis not present

## 2015-02-13 DIAGNOSIS — E11610 Type 2 diabetes mellitus with diabetic neuropathic arthropathy: Secondary | ICD-10-CM | POA: Diagnosis not present

## 2015-02-13 DIAGNOSIS — L97211 Non-pressure chronic ulcer of right calf limited to breakdown of skin: Secondary | ICD-10-CM | POA: Diagnosis not present

## 2015-02-13 DIAGNOSIS — L03115 Cellulitis of right lower limb: Secondary | ICD-10-CM | POA: Diagnosis not present

## 2015-02-13 DIAGNOSIS — E11622 Type 2 diabetes mellitus with other skin ulcer: Secondary | ICD-10-CM | POA: Diagnosis not present

## 2015-02-13 DIAGNOSIS — S41112A Laceration without foreign body of left upper arm, initial encounter: Secondary | ICD-10-CM | POA: Diagnosis not present

## 2015-02-14 NOTE — Progress Notes (Signed)
FLORABELL, VIOLETT ([id number]) Visit Report for 02/13/2015 Arrival Information Details Patient Name: [name], [name] C. Date of Service: 02/13/2015 2:45 PM Medical Record Number: [id number] Patient Account Number: [id number] Date of Birth/Sex: 1927/06/07 (81 y.o. Female) Treating RN: Afful, RN, BSN, Velva Harman Primary Care Physician: Ria Bush Other Clinician: Referring Physician: Ria Bush Treating Physician/Extender: Frann Rider in Treatment: 84 Visit Information History Since Last Visit Added or deleted any medications: No Patient Arrived: Walker Any new allergies or adverse reactions: No Arrival Time: 14:47 Had a fall or experienced change in No Accompanied By: son activities of daily living that may affect Transfer Assistance: None risk of falls: Patient Identification Verified: Yes Signs or symptoms of abuse/neglect since last No Secondary Verification Process Yes visito Completed: Hospitalized since last visit: No Patient Has Alerts: Yes Has Dressing in Place as Prescribed: Yes Patient Alerts: Patient on Blood Has Compression in Place as Prescribed: Yes Thinner Pain Present Now: No Type II Diabetic 81 MG aspirin Electronic Signature(s) Signed: 02/13/2015 2:47:43 PM By: Regan Lemming BSN, RN Entered By: Regan Lemming on 02/13/2015 14:47:43 [name] [name] ([id number]) -------------------------------------------------------------------------------- Encounter Discharge Information Details Patient Name: [name], [name] C. Date of Service: 02/13/2015 2:45 PM Medical Record Number: [id number] Patient Account Number: [id number] Date of Birth/Sex: [birth year] (81 y.o. Female) Treating RN: Afful, RN, BSN, Velva Harman Primary Care Physician: Ria Bush Other Clinician: Referring Physician: Ria Bush Treating Physician/Extender: Frann Rider in Treatment: 26 Encounter Discharge Information Items Discharge Pain Level: 0 Discharge Condition:  Stable Ambulatory Status: Wheelchair Discharge Destination: Home Transportation: Private Auto Accompanied By: son Schedule Follow-up Appointment: No Medication Reconciliation completed and provided to Patient/Care No [place] Nine: Provided on Clinical Summary of Care: 02/13/2015 Form Type Recipient Paper Patient LL Electronic Signature(s) Signed: 02/13/2015 3:17:56 PM By: Regan Lemming BSN, RN Previous Signature: 02/13/2015 3:12:58 PM Version By: Ruthine Dose Entered By: Regan Lemming on 02/13/2015 15:17:56 [name] [name] ([id number]) -------------------------------------------------------------------------------- Lower Extremity Assessment Details Patient Name: [name], [name] Date of Service: 02/13/2015 2:45 PM Medical Record Number: [id number] Patient Account Number: [id number] Date of Birth/Sex: 1928/01/06 (81 y.o. Female) Treating RN: Afful, RN, BSN, Velva Harman Primary Care Physician: Ria Bush Other Clinician: Referring Physician: Ria Bush Treating Physician/Extender: Frann Rider in Treatment: 26 Edema Assessment Assessed: [Left: No] [Right: No] E[Left: dema] [Right: :] Calf Left: Right: Point of Measurement: cm From Medial Instep cm 26 cm Ankle Left: Right: Point of Measurement: cm From Medial Instep cm 19.8 cm Vascular Assessment Claudication: Claudication Assessment [Right:None] Pulses: Posterior Tibial Dorsalis Pedis Palpable: [Right:Yes] Extremity colors, hair growth, and conditions: Extremity Color: [Right:Mottled] Hair Growth on Extremity: [Right:No] Temperature of Extremity: [Right:Warm] Capillary Refill: [Right:< 3 seconds] Toe Nail Assessment Left: Right: Thick: Yes Discolored: Yes Deformed: No Improper Length and Hygiene: No Electronic Signature(s) Signed: 02/13/2015 2:56:53 PM By: Regan Lemming BSN, RN Entered By: Regan Lemming on 02/13/2015 14:56:53 [name] [name] ([id number]) [name] [name]  ([id number]) -------------------------------------------------------------------------------- Multi Wound Chart Details Patient Name: [name], [name] Date of Service: 02/13/2015 2:45 PM Medical Record Number: [id number] Patient Account Number: [id number] Date of Birth/Sex: 02/25/1928 (81 y.o. Female) Treating RN: Baruch Gouty, RN, BSN, Velva Harman Primary Care Physician: Ria Bush Other Clinician: Referring Physician: Ria Bush Treating Physician/Extender: Frann Rider in Treatment: 26 Vital Signs Height(in): 58 Pulse(bpm): 78 Weight(lbs): 151 Blood Pressure 126/49 (mmHg): Body Mass Index(BMI): 32 Temperature(F): 98.1 Respiratory Rate 18 (breaths/min): Photos: [1:No Photos] [N/A:N/A] Wound Location: [1:Right Lower Leg - Medial] [N/A:N/A] Wounding Event: [1:Gradually Appeared] [N/A:N/A] Primary Etiology: [1:Venous Leg Ulcer] [N/A:N/A]  Comorbid History: [1:Cataracts, Anemia, Coronary Artery Disease, Hypertension, Myocardial Infarction, Type II Diabetes, Osteoarthritis] [N/A:N/A] Date Acquired: [1:04/04/2014] [N/A:N/A] Weeks of Treatment: [1:26] [N/A:N/A] Wound Status: [1:Open] [N/A:N/A] Measurements L x W x D 5x1x0.1 [N/A:N/A] (cm) Area (cm) : [1:3.927] [N/A:N/A] Volume (cm) : [1:0.393] [N/A:N/A] % Reduction in Area: [1:33.30%] [N/A:N/A] % Reduction in Volume: 77.80% [N/A:N/A] Classification: [1:Full Thickness Without Exposed Support Structures] [N/A:N/A] HBO Classification: [1:Grade 1] [N/A:N/A] Exudate Amount: [1:Medium] [N/A:N/A] Exudate Type: [1:Serosanguineous] [N/A:N/A] Exudate Color: [1:red, brown] [N/A:N/A] Foul Odor After [1:Yes] [N/A:N/A] Cleansing: Odor Anticipated Due to No [N/A:N/A] Product Use: Wound Margin: [1:Distinct, outline attached] [N/A:N/A] Granulation Amount: Large (67-100%) N/A N/A Granulation Quality: Red N/A N/A Necrotic Amount: Small (1-33%) N/A N/A Exposed Structures: Fascia: No N/A N/A Fat: No Tendon: No Muscle: No Joint:  No Bone: No Limited to Skin Breakdown Epithelialization: Small (1-33%) N/A N/A Periwound Skin Texture: Edema: No N/A N/A Excoriation: No Induration: No Callus: No Crepitus: No Fluctuance: No Friable: No Rash: No Scarring: No Periwound Skin Moist: Yes N/A N/A Moisture: Maceration: No Dry/Scaly: No Periwound Skin Color: Mottled: Yes N/A N/A Atrophie Blanche: No Cyanosis: No Ecchymosis: No Erythema: No Hemosiderin Staining: No Pallor: No Rubor: No Temperature: No Abnormality N/A N/A Tenderness on Yes N/A N/A Palpation: Wound Preparation: Ulcer Cleansing: Other: N/A N/A soap and water Topical Anesthetic Applied: Other: lidocaine 4% Treatment Notes Electronic Signature(s) Signed: 02/13/2015 2:59:50 PM By: Regan Lemming BSN, RN Entered By: Regan Lemming on 02/13/2015 14:59:50 Farias, [name] [name] ([id number]) -------------------------------------------------------------------------------- [place] Details Patient Name: [name], [name] C. Date of Service: 02/13/2015 2:45 PM Medical Record Number: [id number] Patient Account Number: [id number] Date of Birth/Sex: 02-07-1928 (81 y.o. Female) Treating RN: Afful, RN, BSN, Velva Harman Primary Care Physician: Ria Bush Other Clinician: Referring Physician: Ria Bush Treating Physician/Extender: Frann Rider in Treatment: 58 Active Inactive Abuse / Safety / Falls / Self Care Management Nursing Diagnoses: Potential for falls Goals: Patient will remain injury free Date Initiated: 08/15/2014 Goal Status: Active Interventions: Assess fall risk on admission and as needed Notes: Nutrition Nursing Diagnoses: Impaired glucose control: actual or potential Goals: Patient/caregiver verbalizes understanding of need to maintain therapeutic glucose control per primary care physician Date Initiated: 08/15/2014 Goal Status: Active Interventions: Assess patient nutrition upon admission and as needed per  policy Notes: Orientation to the Wound Care Program Nursing Diagnoses: Knowledge deficit related to the wound healing center program Goals: Patient/caregiver will verbalize understanding of the [place], Dyneisha C. ([id number]) Date Initiated: 08/15/2014 Goal Status: Active Interventions: Provide education on orientation to the wound center Notes: Venous Leg Ulcer Nursing Diagnoses: Actual venous Insuffiency (use after diagnosis is confirmed) Goals: Patient will maintain optimal edema control Date Initiated: 08/15/2014 Goal Status: Active Interventions: Compression as ordered Treatment Activities: Test ordered outside of clinic : 02/13/2015 Notes: Wound/Skin Impairment Nursing Diagnoses: Impaired tissue integrity Goals: Patient/caregiver will verbalize understanding of skin care regimen Date Initiated: 08/15/2014 Goal Status: Active Interventions: Assess patient/caregiver ability to obtain necessary supplies Notes: Electronic Signature(s) Signed: 02/13/2015 2:59:43 PM By: Regan Lemming BSN, RN Entered By: Regan Lemming on 02/13/2015 14:59:43 [name], [name] C. ([id number]) -------------------------------------------------------------------------------- Pain Assessment Details Patient Name: [name], [name] C. Date of Service: 02/13/2015 2:45 PM Medical Record Number: [id number] Patient Account Number: [id number] Date of Birth/Sex: Sep 14, 1927 (81 y.o. Female) Treating RN: Baruch Gouty, RN, BSN, Velva Harman Primary Care Physician: Ria Bush Other Clinician: Referring Physician: Ria Bush Treating Physician/Extender: Frann Rider in Treatment: 26 Active Problems Location of Pain Severity and Description of Pain Patient  Has [name] Yes Site Locations Rate the pain. Current Pain Level: 5 Pain Management and Medication Current Pain Management: Medication: Yes Cold Application: Yes Rest: Yes Massage: Yes Activity: Yes T.E.N.S.: Yes Heat  Application: Yes Leg drop or elevation: Yes Is the Current Pain Management Adequate Adequate: How does your pain impact your activities of daily livingo Sleep: Yes Bathing: Yes Appetite: Yes Relationship With Others: Yes Bladder Continence: Yes Emotions: Yes Bowel Continence: Yes Work: Yes Toileting: Yes Drive: Yes Dressing: Yes Hobbies: Yes Electronic Signature(s) Signed: 02/13/2015 2:52:25 PM By: Regan Lemming BSN, RN Entered By: Regan Lemming on 02/13/2015 14:52:25 [place], [name] [name] ([id number]) Marcussen, [name] [name] ([id number]) -------------------------------------------------------------------------------- Patient/Caregiver Education Details Patient Name: [name], [name] C. Date of Service: 02/13/2015 2:45 PM Medical Record Number: [id number] Patient Account Number: [id number] Date of Birth/Gender: 07-Jun-1927 (81 y.o. Female) Treating RN: Afful, RN, BSN, Velva Harman Primary Care Physician: Ria Bush Other Clinician: Referring Physician: Ria Bush Treating Physician/Extender: Frann Rider in Treatment: 26 Education Assessment Education Provided To: Patient Education Topics Provided Welcome To [facility]: Methods: Explain/Verbal Responses: State content correctly Electronic Signature(s) Signed: 02/13/2015 3:18:08 PM By: Regan Lemming BSN, RN Entered By: Regan Lemming on 02/13/2015 15:18:08 Hefner, [name] [name] ([id number]) -------------------------------------------------------------------------------- Wound Assessment Details Patient Name: [name], [name] C. Date of Service: 02/13/2015 2:45 PM Medical Record Number: [id number] Patient Account Number: [id number] Date of Birth/Sex: 1927-08-21 (81 y.o. Female) Treating RN: Afful, RN, BSN, Velva Harman Primary Care Physician: Ria Bush Other Clinician: Referring Physician: Ria Bush Treating Physician/Extender: Frann Rider in Treatment: 26 Wound Status Wound Number: 1 Primary Venous Leg  Ulcer Etiology: Wound Location: Right Lower Leg - Medial Wound Open Wounding Event: Gradually Appeared Status: Date Acquired: 04/04/2014 Comorbid Cataracts, Anemia, Coronary Artery Weeks Of Treatment: 26 History: Disease, Hypertension, Myocardial Clustered Wound: No Infarction, Type II Diabetes, Osteoarthritis Photos Photo Uploaded By: Regan Lemming on 02/13/2015 16:02:44 Wound Measurements Length: (cm) 5 Width: (cm) 1 Depth: (cm) 0.1 Area: (cm) 3.927 Volume: (cm) 0.393 % Reduction in Area: 33.3% % Reduction in Volume: 77.8% Epithelialization: Small (1-33%) Tunneling: No Undermining: No Wound Description Full Thickness Without Exposed Foul Odor Af Classification: Support Structures Due to Produ Grade 1 [name], [name] C. ([id number]) ter Cleansing: Yes ct Use: No Diabetic Severity Earleen Newport): Wound Margin: Distinct, outline attached Exudate Amount: Medium Exudate Type: Serosanguineous Exudate Color: red, brown Wound Bed Granulation Amount: Large (67-100%) Exposed Structure Granulation Quality: Red Fascia Exposed: No Necrotic Amount: Small (1-33%) Fat Layer Exposed: No Necrotic Quality: Adherent Slough Tendon Exposed: No Muscle Exposed: No Joint Exposed: No Bone Exposed: No Limited to Skin Breakdown Periwound Skin Texture Texture Color No Abnormalities Noted: No No Abnormalities Noted: No Callus: No Atrophie Blanche: No Crepitus: No Cyanosis: No Excoriation: No Ecchymosis: No Fluctuance: No Erythema: No Friable: No Hemosiderin Staining: No Induration: No Mottled: Yes Localized Edema: No Pallor: No Rash: No Rubor: No Scarring: No Temperature / Pain Moisture Temperature: No Abnormality No Abnormalities Noted: No Tenderness on Palpation: Yes Dry / Scaly: No Maceration: No Moist: Yes Wound Preparation Ulcer Cleansing: Other: soap and water, Topical Anesthetic Applied: Other: lidocaine 4%, Treatment Notes Wound #1 (Right, Medial Lower  Leg) 1. Cleansed with: Cleanse wound with antibacterial soap and water 3. Peri-wound Care: Moisturizing lotion 4. Dressing Applied: [name], [name] C. ([id number]) Aquacel Ag 5. Secondary Dressing Applied Kerlix/Conform 7. Secured with 2 Layer Compression System - Right Lower Extremity Notes Aquacel foam Electronic Signature(s) Signed: 02/13/2015 2:56:13 PM By: Regan Lemming BSN, RN Entered By: Regan Lemming on 02/13/2015  14:56:13 [name], [name] C. ([id number]) -------------------------------------------------------------------------------- Vitals Details Patient Name: [name], [name] C. Date of Service: 02/13/2015 2:45 PM Medical Record Number: [id number] Patient Account Number: [id number] Date of Birth/Sex: [birth year] (81 y.o. Female) Treating RN: Afful, RN, BSN, Velva Harman Primary Care Physician: Ria Bush Other Clinician: Referring Physician: Ria Bush Treating Physician/Extender: Frann Rider in Treatment: 26 Vital Signs Time Taken: 14:52 Temperature (F): 98.1 Height (in): 58 Pulse (bpm): 78 Weight (lbs): 151 Respiratory Rate (breaths/min): 18 Body Mass Index (BMI): 31.6 Blood Pressure (mmHg): 126/49 Reference Range: 80 - 120 mg / dl Electronic Signature(s) Signed: 02/13/2015 2:54:22 PM By: Regan Lemming BSN, RN Entered By: Regan Lemming on 02/13/2015 14:54:22

## 2015-02-14 NOTE — Progress Notes (Signed)
GILBERT, BITTENBENDER ([id number]) Visit Report for 02/13/2015 Chief Complaint Document Details Patient Name: [name], [name] Date of Service: 02/13/2015 2:45 PM Medical Record Number: [id number] Patient Account Number: [id number] Date of Birth/Sex: [birth year] (81 y.o. Female) Treating RN: Baruch Gouty, RN, BSN, Velva Harman Primary Care Physician: Ria Bush Other Clinician: Referring Physician: Ria Bush Treating Physician/Extender: Frann Rider in Treatment: 26 Information Obtained from: Patient Chief Complaint Patient presents for treatment of an open ulcer due to venous insufficiency. 81 year old patient who is known to me from the [facility] wound care center now comes in follow up here for a chronic venous ulceration of her right lower extremity which she's had for about 6 months. Electronic Signature(s) Signed: 02/13/2015 3:05:59 PM By: Christin Fudge MD, FACS Entered By: Christin Fudge on 02/13/2015 15:05:59 Stilley, [name] ([id number]) -------------------------------------------------------------------------------- HPI Details Patient Name: [name], [name] Date of Service: 02/13/2015 2:45 PM Medical Record Number: [id number] Patient Account Number: [id number] Date of Birth/Sex: 08/20/1927 (81 y.o. Female) Treating RN: Afful, RN, BSN, Velva Harman Primary Care Physician: Ria Bush Other Clinician: Referring Physician: Ria Bush Treating Physician/Extender: Frann Rider in Treatment: 26 History of Present Illness Location: ulcer in the right lower extremity for several months Quality: Patient reports experiencing a dull pain to affected area(s). Severity: Patient states wound (s) are getting better. Duration: Patient has had the wound for > 3 months prior to seeking treatment at the wound center Timing: Pain in wound is Intermittent (comes and goes Context: The wound appeared gradually over time Modifying Factors: Consults to this date include:  vascular surgery for the veins in December 2015 and she's been seeing the wound care center at [facility] for several months. Associated Signs and Symptoms: Patient reports having difficulty standing for long periods. HPI Description: This is an 81 yrs old female we have been following since the beginning of January for a wound on the right medial ankle. She had previously had venous ablation of the greater saphenous vein by Dr. Kellie Simmering. She came to [place] with erythema of the right leg and subsequently is opened in the medial aspect of the right ankle. She has not been very compliant with compression. We ordered her at juxta lite stocking, however she apparently has not been able to wear it. The area on the medial aspect of her left ankle has much less induration and erythema. The edema is better controlled. No evidence of infection is noted. 07/18/14; no major change in the condition of this wound. There is still surface slough and probably some drainage. 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 All other veins showed no evidence of DVT, superficial venous thrombosis or incompetence. however she does have manifestations of chronic venous hypertension and I believe we will change her dressing over to Prisma and a Profore light dressing. she sees her primary care doctor regularly and her last hemoglobin A1c has been around 7%.her other comorbidities are hypertension, CABG in 1998, peptic ulcer disease, history of echo with a ejection fraction of 55-60% and grade 1 diastolic dysfunction. past surgical history include coronary artery bypass graft in 1998, cholecystectomy, appendectomy, hand surgery, endovenous ablation of the saphenous vein with laser by Dr. Kellie Simmering in December 2015. 08/29/2014 - she complains of increased calf pain over the past week. removed her compression  bandage and pain improved. no fever or chills. Mild clear drainage. 09/04/2014 -- she has  not been compliant and has not done a dressing change as we had ordered last week. She always complains and does not keep her wrap on and this past week she has done the same. She has completed a course of doxycycline. 09/25/2014 -- she has tolerated her dressing all week and has not removed it. She says it's hurting a lot today and would not like any debridement. We will do so as per her wishes. she did get a appointment with the vascular surgeon and sees him on 6/28. [name], [name] ([id number]) 10/03/2014 - She was seen by Dr. Tinnie Gens 09/30/2014. Assessment: I reviewed the previous venous duplex exam study performed in September 2015 which reveals no options for treating this venous ulcer. There is no significant superficial venous reflux to treat with laser ablation.Most recent ABI was 0.91 in the right foot with palpable dorsalis pedis pulse at 3+ Plan: patient needs to return to the [place] wound center and be treated by Dr. Con Memos. No arterial or venous options available from vascular standpoint or treatment of right leg stasis ulcer.Continued dressing changes and compression is the treatment of choice 10/16/2014 -- today will be her first application of Apligraf. 10/23/2014 -- we are going to be able to apply her second day of Apligraf next week. She is complaining of some tenderness in the region of Achilles tendon and this is possibly due to her wrap. 11/06/2014 -- she did not have Apligraf last week because the wound was not looking good but she says she is feeling much better now. 11/13/2014 -- she has been doing well this week and she is here for her second Apligraf. 11/27/2014 -- she is here for her third application of Apligraf 12/12/2014 -- she is here for her fourth application of Apligraf. Also during these past 3 days she had an injury to her left forearm which she caught  against the edge of her desk and has a lacerated wound which has gone down to the subcutaneous tissue. 12/26/2014 -- she is here for a last/fifth application of Apligraf. Electronic Signature(s) Signed: 02/13/2015 3:06:11 PM By: Christin Fudge MD, FACS Entered By: Christin Fudge on 02/13/2015 15:06:11 [name], [name] ([id number]) -------------------------------------------------------------------------------- Physical Exam Details Patient Name: [name], [name] Date of Service: 02/13/2015 2:45 PM Medical Record Number: [id number] Patient Account Number: [id number] Date of Birth/Sex: [birth year] (81 y.o. Female) Treating RN: Baruch Gouty, RN, BSN, Velva Harman Primary Care Physician: Ria Bush Other Clinician: Referring Physician: Ria Bush Treating Physician/Extender: Frann Rider in Treatment: 26 Constitutional . Pulse regular. Respirations normal and unlabored. Afebrile. . Eyes Nonicteric. Reactive to light. Ears, Nose, Mouth, and Throat Lips, teeth, and gums WNL.Marland Kitchen Moist mucosa without lesions . Neck supple and nontender. No palpable supraclavicular or cervical adenopathy. Normal sized without goiter. Respiratory WNL. No retractions.. Cardiovascular Pedal Pulses WNL. No clubbing, cyanosis or edema. Lymphatic No adneopathy. No adenopathy. No adenopathy. Musculoskeletal Adexa without tenderness or enlargement.. Digits and nails w/o clubbing, cyanosis, infection, petechiae, ischemia, or inflammatory conditions.. Integumentary (Hair, Skin) No suspicious lesions. No crepitus or fluctuance. No peri-wound warmth or erythema. No masses.Marland Kitchen Psychiatric Judgement and insight Intact.. No evidence of depression, anxiety, or agitation.. Notes the wound has shown much improvement today with the [place] which was used last week. there is no maceration and there is healthy granulation tissue. Electronic Signature(s) Signed: 02/13/2015 3:07:10 PM By: Christin Fudge MD,  FACS Entered By: Christin Fudge on 02/13/2015 15:07:10 [name], [name] ([id number]) -------------------------------------------------------------------------------- Physician Orders Details Patient Name: [name] Faster  [name]. Date of Service: 02/13/2015 2:45 PM Medical Record Number: [id number] Patient Account Number: [id number] Date of Birth/Sex: 09-06-1927 (81 y.o. Female) Treating RN: Afful, RN, BSN, Velva Harman Primary Care Physician: Ria Bush Other Clinician: Referring Physician: Ria Bush Treating Physician/Extender: Frann Rider in Treatment: 90 Verbal / Phone Orders: Yes Clinician: Afful, RN, BSN, Rita Read Back and Verified: Yes Diagnosis Coding Wound Cleansing Wound #1 Right,Medial Lower Leg o Cleanse wound with mild soap and water Anesthetic Wound #1 Right,Medial Lower Leg o Topical Lidocaine 4% cream applied to wound bed prior to debridement Primary Wound Dressing Wound #1 Right,Medial Lower Leg o Aquacel Ag - Foam Dressing Change Frequency Wound #1 Right,Medial Lower Leg o Change dressing every week Follow-up Appointments Wound #1 Right,Medial Lower Leg o Return Appointment in 1 week. Edema Control Wound #1 Right,Medial Lower Leg o 2 Layer Compression System - Right Lower Extremity Electronic Signature(s) Signed: 02/13/2015 3:04:19 PM By: Regan Lemming BSN, RN Signed: 02/13/2015 4:13:02 PM By: Christin Fudge MD, FACS Entered By: Regan Lemming on 02/13/2015 15:04:19 [name], [name] ([id number]) -------------------------------------------------------------------------------- Problem List Details Patient Name: [name], [name] Date of Service: 02/13/2015 2:45 PM Medical Record Number: [id number] Patient Account Number: [id number] Date of Birth/Sex: 19-Feb-1928 (81 y.o. Female) Treating RN: Baruch Gouty, RN, BSN, Velva Harman Primary Care Physician: Ria Bush Other Clinician: Referring Physician: Ria Bush Treating Physician/Extender:  Frann Rider in Treatment: 26 Active Problems ICD-10 Encounter Code Description Active Date Diagnosis E11.622 Type 2 diabetes mellitus with other skin ulcer 08/15/2014 Yes E11.610 Type 2 diabetes mellitus with diabetic neuropathic 08/15/2014 Yes arthropathy I87.331 Chronic venous hypertension (idiopathic) with ulcer and 08/15/2014 Yes inflammation of right lower extremity L03.115 Cellulitis of right lower limb 08/29/2014 Yes S41.112A Laceration without foreign body of left upper arm, initial 12/12/2014 Yes encounter Inactive Problems Resolved Problems Electronic Signature(s) Signed: 02/13/2015 3:05:53 PM By: Christin Fudge MD, FACS Entered By: Christin Fudge on 02/13/2015 15:05:53 Epp, [name] ([id number]) -------------------------------------------------------------------------------- Progress Note Details Patient Name: [name], [name] Date of Service: 02/13/2015 2:45 PM Medical Record Number: [id number] Patient Account Number: [id number] Date of Birth/Sex: 11/02/1927 (81 y.o. Female) Treating RN: Baruch Gouty, RN, BSN, Velva Harman Primary Care Physician: Ria Bush Other Clinician: Referring Physician: Ria Bush Treating Physician/Extender: Frann Rider in Treatment: 26 Subjective Chief Complaint Information obtained from Patient Patient presents for treatment of an open ulcer due to venous insufficiency. 81 year old patient who is known to me from the [facility] wound care center now comes in follow up here for a chronic venous ulceration of her right lower extremity which she's had for about 6 months. History of Present Illness (HPI) The following HPI elements were documented for the patient's wound: Location: ulcer in the right lower extremity for several months Quality: Patient reports experiencing a dull pain to affected area(s). Severity: Patient states wound (s) are getting better. Duration: Patient has had the wound for > 3 months prior to seeking  treatment at the wound center Timing: Pain in wound is Intermittent (comes and goes Context: The wound appeared gradually over time Modifying Factors: Consults to this date include: vascular surgery for the veins in December 2015 and she's been seeing the wound care center at [facility] for several months. Associated Signs and Symptoms: Patient reports having difficulty standing for long periods. This is an 81 yrs old female we have been following since the beginning of January for a wound on the right medial ankle. She had previously had venous ablation of the greater saphenous vein by Dr. Kellie Simmering. She came  to [place] with erythema of the right leg and subsequently is opened in the medial aspect of the right ankle. She has not been very compliant with compression. We ordered her at juxta lite stocking, however she apparently has not been able to wear it. The area on the medial aspect of her left ankle has much less induration and erythema. The edema is better controlled. No evidence of infection is noted. 07/18/14; no major change in the condition of this wound. There is still surface slough and probably some drainage. 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 All other veins showed no evidence of DVT, superficial venous thrombosis or incompetence. however she does have manifestations of chronic venous hypertension and I believe we will change her dressing over to Prisma and a Profore light dressing. she sees her primary care doctor regularly and her last hemoglobin A1c has been around 7%.her other comorbidities are hypertension, CABG in 1998, peptic ulcer disease, history of echo with a ejection fraction of 55-60% and grade 1 diastolic dysfunction. past surgical history include coronary artery bypass graft in 1998, cholecystectomy, appendectomy, hand surgery, endovenous ablation  of the saphenous vein with laser by Dr. Kellie Simmering in December 2015. [name], [name] ([id number]) 08/29/2014 - she complains of increased calf pain over the past week. removed her compression bandage and pain improved. no fever or chills. Mild clear drainage. 09/04/2014 -- she has not been compliant and has not done a dressing change as we had ordered last week. She always complains and does not keep her wrap on and this past week she has done the same. She has completed a course of doxycycline. 09/25/2014 -- she has tolerated her dressing all week and has not removed it. She says it's hurting a lot today and would not like any debridement. We will do so as per her wishes. she did get a appointment with the vascular surgeon and sees him on 6/28. 10/03/2014 - She was seen by Dr. Tinnie Gens 09/30/2014. Assessment: I reviewed the previous venous duplex exam study performed in September 2015 which reveals no options for treating this venous ulcer. There is no significant superficial venous reflux to treat with laser ablation.Most recent ABI was 0.91 in the right foot with palpable dorsalis pedis pulse at 3+ Plan: patient needs to return to the [place] wound center and be treated by Dr. Con Memos. No arterial or venous options available from vascular standpoint or treatment of right leg stasis ulcer.Continued dressing changes and compression is the treatment of choice 10/16/2014 -- today will be her first application of Apligraf. 10/23/2014 -- we are going to be able to apply her second day of Apligraf next week. She is complaining of some tenderness in the region of Achilles tendon and this is possibly due to her wrap. 11/06/2014 -- she did not have Apligraf last week because the wound was not looking good but she says she is feeling much better now. 11/13/2014 -- she has been doing well this week and she is here for her second Apligraf. 11/27/2014 -- she is here for her third application of  Apligraf 12/12/2014 -- she is here for her fourth application of Apligraf. Also during these past 3 days she had an injury to her left forearm which she caught against the edge of her desk and has a lacerated wound which has gone down to the subcutaneous  tissue. 12/26/2014 -- she is here for a last/fifth application of Apligraf. Objective Constitutional Pulse regular. Respirations normal and unlabored. Afebrile. Vitals Time Taken: 2:52 PM, Height: 58 in, Weight: 151 lbs, BMI: 31.6, Temperature: 98.1 F, Pulse: 78 bpm, Respiratory Rate: 18 breaths/min, Blood Pressure: 126/49 mmHg. Eyes Nonicteric. Reactive to light. Ears, Nose, Mouth, and Throat [name], [name] ([id number]) Lips, teeth, and gums WNL.Marland Kitchen Moist mucosa without lesions . Neck supple and nontender. No palpable supraclavicular or cervical adenopathy. Normal sized without goiter. Respiratory WNL. No retractions.. Cardiovascular Pedal Pulses WNL. No clubbing, cyanosis or edema. Lymphatic No adneopathy. No adenopathy. No adenopathy. Musculoskeletal Adexa without tenderness or enlargement.. Digits and nails w/o clubbing, cyanosis, infection, petechiae, ischemia, or inflammatory conditions.Marland Kitchen Psychiatric Judgement and insight Intact.. No evidence of depression, anxiety, or agitation.. General Notes: the wound has shown much improvement today with the Siltec Sorbact which was used last week. there is no maceration and there is healthy granulation tissue. Integumentary (Hair, Skin) No suspicious lesions. No crepitus or fluctuance. No peri-wound warmth or erythema. No masses.. Wound #1 status is Open. Original cause of wound was Gradually Appeared. The wound is located on the Right,Medial Lower Leg. The wound measures 5cm length x 1cm width x 0.1cm depth; 3.927cm^2 area and 0.393cm^3 volume. The wound is limited to skin breakdown. There is no tunneling or undermining noted. There is a medium amount of serosanguineous drainage  noted. The wound margin is distinct with the outline attached to the wound base. There is large (67-100%) red granulation within the wound bed. There is a small (1-33%) amount of necrotic tissue within the wound bed including Adherent Slough. The periwound skin appearance exhibited: Moist, Mottled. The periwound skin appearance did not exhibit: Callus, Crepitus, Excoriation, Fluctuance, Friable, Induration, Localized Edema, Rash, Scarring, [name]/Scaly, Maceration, Atrophie Blanche, Cyanosis, Ecchymosis, Hemosiderin Staining, Pallor, Rubor, Erythema. Periwound temperature was noted as No Abnormality. The periwound has tenderness on palpation. Assessment Active Problems ICD-10 E11.622 - Type 2 diabetes mellitus with other skin ulcer E11.610 - Type 2 diabetes mellitus with diabetic neuropathic arthropathy I87.331 - Chronic venous hypertension (idiopathic) with ulcer and inflammation of right lower extremity [name], [name] ([id number]) L03.115 - Cellulitis of right lower limb S41.112A - Laceration without foreign body of left upper arm, initial encounter The wound has shown much improvement today with the Siltec Sorbact which was used last week. There is no maceration and there is healthy granulation tissue. This week we will use Aqua cell silver alginate with foam, and use 2 layer compression which is all she can tolerate. She is again encouraged to continue with exercise, elevation and good control of her diabetes. Plan Wound Cleansing: Wound #1 Right,Medial Lower Leg: Cleanse wound with mild soap and water Anesthetic: Wound #1 Right,Medial Lower Leg: Topical Lidocaine 4% cream applied to wound bed prior to debridement Primary Wound Dressing: Wound #1 Right,Medial Lower Leg: Aquacel Ag - Foam Dressing Change Frequency: Wound #1 Right,Medial Lower Leg: Change dressing every week Follow-up Appointments: Wound #1 Right,Medial Lower Leg: Return Appointment in 1 week. Edema  Control: Wound #1 Right,Medial Lower Leg: 2 Layer Compression System - Right Lower Extremity The wound has shown much improvement today with the Siltec Sorbact which was used last week. There is no maceration and there is healthy granulation tissue. This week we will use Aqua cell silver alginate with foam, and use 2 layer compression which is all she can tolerate. Doell, Randi [name]. ([id number]) She is again encouraged to continue with exercise, elevation and good  control of her diabetes. Electronic Signature(s) Signed: 02/13/2015 3:10:14 PM By: Christin Fudge MD, FACS Entered By: Christin Fudge on 02/13/2015 15:10:14 Insley, [name] ([id number]) -------------------------------------------------------------------------------- SuperBill Details Patient Name: Gillaspie, Hildegarde [name]. Date of Service: 02/13/2015 Medical Record Number: [id number] Patient Account Number: [id number] Date of Birth/Sex: 03/11/1928 (81 y.o. Female) Treating RN: Afful, RN, BSN, Velva Harman Primary Care Physician: Ria Bush Other Clinician: Referring Physician: Ria Bush Treating Physician/Extender: Frann Rider in Treatment: 26 Diagnosis Coding ICD-10 Codes Code Description E11.622 Type 2 diabetes mellitus with other skin ulcer E11.610 Type 2 diabetes mellitus with diabetic neuropathic arthropathy Chronic venous hypertension (idiopathic) with ulcer and inflammation of right lower I87.331 extremity L03.115 Cellulitis of right lower limb S41.112A Laceration without foreign body of left upper arm, initial encounter Facility Procedures CPT4: Description Modifier Quantity Code [id number] (Facility Use Only) [phone] - Susan Moore RT 1 LEG Physician Procedures CPT4: Description Modifier Quantity Code [id number] - WC PHYS LEVEL 3 - EST PT 1 ICD-10 Description Diagnosis E11.622 Type 2 diabetes mellitus with other skin ulcer E11.610 Type 2 diabetes mellitus with diabetic neuropathic arthropathy  I87.331 Chronic  venous hypertension (idiopathic) with ulcer and inflammation of right lower extremity Electronic Signature(s) Signed: 02/13/2015 3:16:36 PM By: Regan Lemming BSN, RN Signed: 02/13/2015 4:13:02 PM By: Christin Fudge MD, FACS Previous Signature: 02/13/2015 3:10:30 PM Version By: Christin Fudge MD, FACS Entered By: Regan Lemming on 02/13/2015 15:16:36

## 2015-02-20 ENCOUNTER — Encounter: Payer: Medicare Other | Admitting: Surgery

## 2015-02-20 DIAGNOSIS — S41112A Laceration without foreign body of left upper arm, initial encounter: Secondary | ICD-10-CM | POA: Diagnosis not present

## 2015-02-20 DIAGNOSIS — E11610 Type 2 diabetes mellitus with diabetic neuropathic arthropathy: Secondary | ICD-10-CM | POA: Diagnosis not present

## 2015-02-20 DIAGNOSIS — L97211 Non-pressure chronic ulcer of right calf limited to breakdown of skin: Secondary | ICD-10-CM | POA: Diagnosis not present

## 2015-02-20 DIAGNOSIS — I87331 Chronic venous hypertension (idiopathic) with ulcer and inflammation of right lower extremity: Secondary | ICD-10-CM | POA: Diagnosis not present

## 2015-02-20 DIAGNOSIS — L03115 Cellulitis of right lower limb: Secondary | ICD-10-CM | POA: Diagnosis not present

## 2015-02-20 DIAGNOSIS — E11622 Type 2 diabetes mellitus with other skin ulcer: Secondary | ICD-10-CM | POA: Diagnosis not present

## 2015-02-21 NOTE — Progress Notes (Signed)
EVEREST, LOO ([id number]) Visit Report for 02/20/2015 Arrival Information Details Patient Name: [name], [name] Date of Service: 02/20/2015 2:45 PM Medical Record Number: [id number] Patient Account Number: [id number] Date of Birth/Sex: 02-20-1928 (81 y.o. Female) Treating RN: Afful, RN, BSN, Velva Harman Primary Care Physician: Ria Bush Other Clinician: Referring Physician: Ria Bush Treating Physician/Extender: Frann Rider in Treatment: 27 Visit Information History Since Last Visit Any new allergies or adverse reactions: No Patient Arrived: [name] Had a fall or experienced change in No Arrival Time: 14:52 activities of daily living that may affect Accompanied By: self risk of falls: Transfer Assistance: None Signs or symptoms of abuse/neglect since last No Patient Identification Verified: Yes visito Secondary Verification Process Yes Hospitalized since last visit: No Completed: Has Dressing in Place as Prescribed: Yes Patient Has Alerts: Yes Pain Present Now: No Patient Alerts: Patient on Blood Thinner Type II Diabetic 81 MG aspirin Electronic Signature(s) Signed: 02/20/2015 3:34:01 PM By: Regan Lemming BSN, RN Previous Signature: 02/20/2015 2:52:42 PM Version By: Regan Lemming BSN, RN Entered By: Regan Lemming on 02/20/2015 15:34:01 Burtis, [name] ([id number]) -------------------------------------------------------------------------------- Encounter Discharge Information Details Patient Name: [name], [name] Date of Service: 02/20/2015 2:45 PM Medical Record Number: [id number] Patient Account Number: [id number] Date of Birth/Sex: 1927/11/19 (81 y.o. Female) Treating RN: Baruch Gouty, RN, BSN, Velva Harman Primary Care Physician: Ria Bush Other Clinician: Referring Physician: Ria Bush Treating Physician/Extender: Frann Rider in Treatment: 27 Encounter Discharge Information Items Discharge Pain Level: 0 Discharge Condition:  Stable Ambulatory Status: Walker Discharge Destination: Home Transportation: Private Auto Accompanied By: self Schedule Follow-up Appointment: No Medication Reconciliation completed and provided to Patient/Care No Mirren Gest: Provided on Clinical Summary of Care: 02/20/2015 Form Type Recipient Paper Patient LL Electronic Signature(s) Signed: 02/20/2015 3:35:09 PM By: Regan Lemming BSN, RN Previous Signature: 02/20/2015 3:32:22 PM Version By: Ruthine Dose Entered By: Regan Lemming on 02/20/2015 15:35:09 [name], [name] ([id number]) -------------------------------------------------------------------------------- Lower Extremity Assessment Details Patient Name: [name], [name] Date of Service: 02/20/2015 2:45 PM Medical Record Number: [id number] Patient Account Number: [id number] Date of Birth/Sex: 1927/09/30 (81 y.o. Female) Treating RN: Afful, RN, BSN, Velva Harman Primary Care Physician: Ria Bush Other Clinician: Referring Physician: Ria Bush Treating Physician/Extender: Frann Rider in Treatment: 27 Edema Assessment Assessed: [Left: No] [Right: No] Edema: [Left: N] [Right: o] Calf Left: Right: Point of Measurement: 28 cm From Medial Instep cm 26 cm Ankle Left: Right: Point of Measurement: 8 cm From Medial Instep cm 19 cm Vascular Assessment Claudication: Claudication Assessment [Right:None] Pulses: Posterior Tibial Dorsalis Pedis Palpable: [Right:Yes] Extremity colors, hair growth, and conditions: Extremity Color: [Right:Mottled] Hair Growth on Extremity: [Right:No] Temperature of Extremity: [Right:Warm] Capillary Refill: [Right:< 3 seconds] Toe Nail Assessment Left: Right: Thick: Yes Discolored: Yes Deformed: No Improper Length and Hygiene: No Electronic Signature(s) Signed: 02/20/2015 3:01:58 PM By: Regan Lemming BSN, RN Previous Signature: 02/20/2015 3:01:21 PM Version By: Regan Lemming BSN, RN [place], [name] ([id number]) Entered By:  Regan Lemming on 02/20/2015 15:01:58 Ury, Rogina C. ([id number]) -------------------------------------------------------------------------------- Multi Wound Chart Details Patient Name: [name], [name] Date of Service: 02/20/2015 2:45 PM Medical Record Number: [id number] Patient Account Number: [id number] Date of Birth/Sex: Jul 10, 1927 (81 y.o. Female) Treating RN: Baruch Gouty, RN, BSN, Velva Harman Primary Care Physician: Ria Bush Other Clinician: Referring Physician: Ria Bush Treating Physician/Extender: Frann Rider in Treatment: 27 Vital Signs Height(in): 58 Pulse(bpm): 75 Weight(lbs): 151 Blood Pressure 122/58 (mmHg): Body Mass Index(BMI): 32 Temperature(F): 98.1 Respiratory Rate 18 (breaths/min): Photos: [1:No Photos] [N/A:N/A] Wound Location: [1:Right Lower Leg - Medial] [  N/A:N/A] Wounding Event: [1:Gradually Appeared] [N/A:N/A] Primary Etiology: [1:Venous Leg Ulcer] [N/A:N/A] Comorbid History: [1:Cataracts, Anemia, Coronary Artery Disease, Hypertension, Myocardial Infarction, Type II Diabetes, Osteoarthritis] [N/A:N/A] Date Acquired: [1:04/04/2014] [N/A:N/A] Weeks of Treatment: [1:27] [N/A:N/A] Wound Status: [1:Open] [N/A:N/A] Measurements L x W x D 6x1.5x0.2 [N/A:N/A] (cm) Area (cm) : [1:7.069] [N/A:N/A] Volume (cm) : [1:1.414] [N/A:N/A] % Reduction in Area: [1:-20.00%] [N/A:N/A] % Reduction in Volume: 20.00% [N/A:N/A] Classification: [1:Full Thickness Without Exposed Support Structures] [N/A:N/A] HBO Classification: [1:Grade 1] [N/A:N/A] Exudate Amount: [1:Medium] [N/A:N/A] Exudate Type: [1:Serosanguineous] [N/A:N/A] Exudate Color: [1:red, brown] [N/A:N/A] Foul Odor After [1:Yes] [N/A:N/A] Cleansing: Odor Anticipated Due to No [N/A:N/A] Product Use: Wound Margin: [1:Distinct, outline attached] [N/A:N/A] Granulation Amount: Large (67-100%) N/A N/A Granulation Quality: Red N/A N/A Necrotic Amount: Small (1-33%) N/A N/A Exposed  Structures: Fascia: No N/A N/A Fat: No Tendon: No Muscle: No Joint: No Bone: No Limited to Skin Breakdown Epithelialization: Small (1-33%) N/A N/A Periwound Skin Texture: Edema: No N/A N/A Excoriation: No Induration: No Callus: No Crepitus: No Fluctuance: No Friable: No Rash: No Scarring: No Periwound Skin Moist: Yes N/A N/A Moisture: Maceration: No Dry/Scaly: No Periwound Skin Color: Mottled: Yes N/A N/A Atrophie Blanche: No Cyanosis: No Ecchymosis: No Erythema: No Hemosiderin Staining: No Pallor: No Rubor: No Temperature: No Abnormality N/A N/A Tenderness on Yes N/A N/A Palpation: Wound Preparation: Ulcer Cleansing: Other: N/A N/A soap and water Topical Anesthetic Applied: Other: lidocaine 4% Treatment Notes Electronic Signature(s) Signed: 02/20/2015 3:18:46 PM By: Regan Lemming BSN, RN Entered By: Regan Lemming on 02/20/2015 15:18:45 [name], [name] ([id number]) -------------------------------------------------------------------------------- [place] Details Patient Name: [name], [name] Date of Service: 02/20/2015 2:45 PM Medical Record Number: [id number] Patient Account Number: [id number] Date of Birth/Sex: [birth year] (81 y.o. Female) Treating RN: Afful, RN, BSN, Velva Harman Primary Care Physician: Ria Bush Other Clinician: Referring Physician: Ria Bush Treating Physician/Extender: Frann Rider in Treatment: 39 Active Inactive Abuse / Safety / Falls / Self Care Management Nursing Diagnoses: Potential for falls Goals: Patient will remain injury free Date Initiated: 08/15/2014 Goal Status: Active Interventions: Assess fall risk on admission and as needed Notes: Nutrition Nursing Diagnoses: Impaired glucose control: actual or potential Goals: Patient/caregiver verbalizes understanding of need to maintain therapeutic glucose control per primary care physician Date Initiated: 08/15/2014 Goal Status:  Active Interventions: Assess patient nutrition upon admission and as needed per policy Notes: Orientation to the Wound Care Program Nursing Diagnoses: Knowledge deficit related to the wound healing center program Goals: Patient/caregiver will verbalize understanding of the [place], Kathelene C. ([id number]) Date Initiated: 08/15/2014 Goal Status: Active Interventions: Provide education on orientation to the wound center Notes: Venous Leg Ulcer Nursing Diagnoses: Actual venous Insuffiency (use after diagnosis is confirmed) Goals: Patient will maintain optimal edema control Date Initiated: 08/15/2014 Goal Status: Active Interventions: Compression as ordered Treatment Activities: Test ordered outside of clinic : 02/20/2015 Notes: Wound/Skin Impairment Nursing Diagnoses: Impaired tissue integrity Goals: Patient/caregiver will verbalize understanding of skin care regimen Date Initiated: 08/15/2014 Goal Status: Active Interventions: Assess patient/caregiver ability to obtain necessary supplies Notes: Electronic Signature(s) Signed: 02/20/2015 3:18:07 PM By: Regan Lemming BSN, RN Entered By: Regan Lemming on 02/20/2015 15:18:07 [name], [name] ([id number]) -------------------------------------------------------------------------------- Pain Assessment Details Patient Name: [name], [name] Date of Service: 02/20/2015 2:45 PM Medical Record Number: [id number] Patient Account Number: [id number] Date of Birth/Sex: 1927/05/13 (81 y.o. Female) Treating RN: Afful, RN, BSN, Velva Harman Primary Care Physician: Ria Bush Other Clinician: Referring Physician: Ria Bush Treating Physician/Extender: Frann Rider in Treatment:  27 Active Problems Location of Pain Severity and Description of Pain Patient Has [name] No Site Locations Pain Management and Medication Current Pain Management: Electronic Signature(s) Signed: 02/20/2015 2:52:52 PM By:  Regan Lemming BSN, RN Entered By: Regan Lemming on 02/20/2015 14:52:52 [name], [name] ([id number]) -------------------------------------------------------------------------------- Patient/Caregiver Education Details Patient Name: [name], [name] Date of Service: 02/20/2015 2:45 PM Medical Record Number: [id number] Patient Account Number: [id number] Date of Birth/Gender: [birth year] (81 y.o. Female) Treating RN: Afful, RN, BSN, Velva Harman Primary Care Physician: Ria Bush Other Clinician: Referring Physician: Ria Bush Treating Physician/Extender: Frann Rider in Treatment: 58 Education Assessment Education Provided To: Patient Education Topics Provided Welcome To [facility] [place]: Methods: Explain/Verbal Responses: State content correctly Electronic Signature(s) Signed: 02/20/2015 3:35:21 PM By: Regan Lemming BSN, RN Entered By: Regan Lemming on 02/20/2015 15:35:21 [name], [name] ([id number]) -------------------------------------------------------------------------------- Wound Assessment Details Patient Name: [name], [name] Date of Service: 02/20/2015 2:45 PM Medical Record Number: [id number] Patient Account Number: [id number] Date of Birth/Sex: 02/06/1928 (81 y.o. Female) Treating RN: Afful, RN, BSN, Velva Harman Primary Care Physician: Ria Bush Other Clinician: Referring Physician: Ria Bush Treating Physician/Extender: Frann Rider in Treatment: 27 Wound Status Wound Number: 1 Primary Venous Leg Ulcer Etiology: Wound Location: Right Lower Leg - Medial Wound Open Wounding Event: Gradually Appeared Status: Date Acquired: 04/04/2014 Comorbid Cataracts, Anemia, Coronary Artery Weeks Of Treatment: 27 History: Disease, Hypertension, Myocardial Clustered Wound: No Infarction, Type II Diabetes, Osteoarthritis Photos Photo Uploaded By: Regan Lemming on 02/20/2015 16:35:33 Wound Measurements Length: (cm) 6 Width: (cm) 1.5 Depth: (cm)  0.2 Area: (cm) 7.069 Volume: (cm) 1.414 % Reduction in Area: -20% % Reduction in Volume: 20% Epithelialization: Small (1-33%) Tunneling: No Undermining: No Wound Description Full Thickness Without Exposed Foul Odor Af Classification: Support Structures Due to Produ Grade 1 [place], Oluwasemilore C. ([id number]) ter Cleansing: Yes ct Use: No Diabetic Severity Earleen [place]): Wound Margin: Distinct, outline attached Exudate Amount: Medium Exudate Type: Serosanguineous Exudate Color: red, brown Wound Bed Granulation Amount: Large (67-100%) Exposed Structure Granulation Quality: Red Fascia Exposed: No Necrotic Amount: Small (1-33%) Fat Layer Exposed: No Necrotic Quality: Adherent Slough Tendon Exposed: No Muscle Exposed: No Joint Exposed: No Bone Exposed: No Limited to Skin Breakdown Periwound Skin Texture Texture Color No Abnormalities Noted: No No Abnormalities Noted: No Callus: No Atrophie Blanche: No Crepitus: No Cyanosis: No Excoriation: No Ecchymosis: No Fluctuance: No Erythema: No Friable: No Hemosiderin Staining: No Induration: No Mottled: Yes Localized Edema: No Pallor: No Rash: No Rubor: No Scarring: No Temperature / Pain Moisture Temperature: No Abnormality No Abnormalities Noted: No Tenderness on Palpation: Yes Dry / Scaly: No Maceration: No Moist: Yes Wound Preparation Ulcer Cleansing: Other: soap and water, Topical Anesthetic Applied: Other: lidocaine 4%, Treatment Notes Wound #1 (Right, Medial Lower Leg) 1. Cleansed with: Cleanse wound with antibacterial soap and water 3. Peri-wound Care: Moisturizing lotion 4. Dressing Applied: More[place], Oriel C. ([id number]) Other dressing (specify in notes) 7. Secured with 2 Layer Compression System - Right Lower Extremity Notes Aquacel foam, kerlix and cobAN Electronic Signature(s) Signed: 02/20/2015 3:00:56 PM By: Regan Lemming BSN, RN Entered By: Regan Lemming on 02/20/2015 15:00:56 [place], [name] ([id number]) -------------------------------------------------------------------------------- Vitals Details Patient Name: Ruttan, Evaluna C. Date of Service: 02/20/2015 2:45 PM Medical Record Number: [id number] Patient Account Number: [id number] Date of Birth/Sex: 04-15-1927 (81 y.o. Female) Treating RN: Baruch Gouty, RN, BSN, Velva Harman Primary Care Physician: Ria Bush Other Clinician: Referring Physician: Ria Bush Treating Physician/Extender: Frann Rider in Treatment: 27 Vital Signs Time Taken: 14:53  Temperature (F): 98.1 Height (in): 58 Pulse (bpm): 75 Weight (lbs): 151 Respiratory Rate (breaths/min): 18 Body Mass Index (BMI): 31.6 Blood Pressure (mmHg): 122/58 Reference Range: 80 - 120 mg / dl Electronic Signature(s) Signed: 02/20/2015 2:53:40 PM By: Regan Lemming BSN, RN Entered By: Regan Lemming on 02/20/2015 14:53:39

## 2015-02-22 NOTE — Progress Notes (Signed)
[name] [name] ([id number]) Visit Report for 02/20/2015 Chief Complaint Document Details Patient Name: [name] [name] C. Date of Service: 02/20/2015 2:45 PM Medical Record Number: [id number] Patient Account Number: [id number] Date of Birth/Sex: [birth year] (81 y.o. Female) Treating RN: Baruch Gouty, RN, BSN, Velva Harman Primary Care Physician: Ria Bush Other Clinician: Referring Physician: Ria Bush Treating Physician/Extender: Frann Rider in Treatment: 27 Information Obtained from: Patient Chief Complaint Patient presents for treatment of an open ulcer due to venous insufficiency. 81 year old patient who is known to me from the [facility] wound care center now comes in follow up here for a chronic venous ulceration of her right lower extremity which she's had for about 6 months. Electronic Signature(s) Signed: 02/20/2015 3:29:09 PM By: Christin Fudge MD, FACS Entered By: Christin Fudge on 02/20/2015 15:29:09 [name] [name] [name] -------------------------------------------------------------------------------- HPI Details Patient Name: [name] [name] C. Date of Service: 02/20/2015 2:45 PM Medical Record Number: [id number] Patient Account Number: [id number] Date of Birth/Sex: 16-May-1927 (81 y.o. Female) Treating RN: Afful, RN, BSN, Velva Harman Primary Care Physician: Ria Bush Other Clinician: Referring Physician: Ria Bush Treating Physician/Extender: Frann Rider in Treatment: 27 History of Present Illness Location: ulcer in the right lower extremity for several months Quality: Patient reports experiencing a dull pain to affected area(s). Severity: Patient states wound (s) are getting better. Duration: Patient has had the wound for > 3 months prior to seeking treatment at the wound center Timing: Pain in wound is Intermittent (comes and goes Context: The wound appeared gradually over time Modifying Factors: Consults to this date include:  vascular surgery for the veins in December 2015 and she's been seeing the wound care center at [facility] for several months. Associated Signs and Symptoms: Patient reports having difficulty standing for long periods. HPI Description: This is an 81 yrs old female we have been following since the beginning of January for a wound on the right medial ankle. She had previously had venous ablation of the greater saphenous vein by Dr. Kellie Simmering. She came to [place] with erythema of the right leg and subsequently is opened in the medial aspect of the right ankle. She has not been very compliant with compression. We ordered her at juxta lite stocking, however she apparently has not been able to wear it. The area on the medial aspect of her left ankle has much less induration and erythema. The edema is better controlled. No evidence of infection is noted. 07/18/14; no major change in the condition of this wound. There is still surface slough and probably some drainage. 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 All other veins showed no evidence of DVT, superficial venous thrombosis or incompetence. however she does have manifestations of chronic venous hypertension and I believe we will change her dressing over to Prisma and a Profore light dressing. she sees her primary care doctor regularly and her last hemoglobin A1c has been around 7%.her other comorbidities are hypertension, CABG in 1998, peptic ulcer disease, history of echo with a ejection fraction of 55-60% and grade 1 diastolic dysfunction. past surgical history include coronary artery bypass graft in 1998, cholecystectomy, appendectomy, hand surgery, endovenous ablation of the saphenous vein with laser by Dr. Kellie Simmering in December 2015. 08/29/2014 - she complains of increased calf pain over the past week. removed her compression  bandage and pain improved. no fever or chills. Mild clear drainage. 09/04/2014 -- she has  not been compliant and has not done a dressing change as we had ordered last week. She always complains and does not keep her wrap on and this past week she has done the same. She has completed a course of doxycycline. 09/25/2014 -- she has tolerated her dressing all week and has not removed it. She says it's hurting a lot today and would not like any debridement. We will do so as per her wishes. she did get a appointment with the vascular surgeon and sees him on 6/28. [name] [name] ([id number]) 10/03/2014 - She was seen by Dr. Tinnie Gens 09/30/2014. Assessment: I reviewed the previous venous duplex exam study performed in September 2015 which reveals no options for treating this venous ulcer. There is no significant superficial venous reflux to treat with laser ablation.Most recent ABI was 0.91 in the right foot with palpable dorsalis pedis pulse at 3+ Plan: patient needs to return to the [facility] wound center and be treated by Dr. Con Memos. No arterial or venous options available from vascular standpoint or treatment of right leg stasis ulcer.Continued dressing changes and compression is the treatment of choice 10/16/2014 -- today will be her first application of Apligraf. 10/23/2014 -- we are going to be able to apply her second day of Apligraf next week. She is complaining of some tenderness in the region of Achilles tendon and this is possibly due to her wrap. 11/06/2014 -- she did not have Apligraf last week because the wound was not looking good but she says she is feeling much better now. 11/13/2014 -- she has been doing well this week and she is here for her second Apligraf. 11/27/2014 -- she is here for her third application of Apligraf 12/12/2014 -- she is here for her fourth application of Apligraf. Also during these past 3 days she had an injury to her left forearm which she caught  against the edge of her desk and has a lacerated wound which has gone down to the subcutaneous tissue. 12/26/2014 -- she is here for a last/fifth application of Apligraf. Electronic Signature(s) Signed: 02/20/2015 3:29:15 PM By: Christin Fudge MD, FACS Entered By: Christin Fudge on 02/20/2015 15:29:15 [name] [name] [name] -------------------------------------------------------------------------------- Physical Exam Details Patient Name: Cillo, Heily C. Date of Service: 02/20/2015 2:45 PM Medical Record Number: [id number] Patient Account Number: [id number] Date of Birth/Sex: 02-Apr-1928 (81 y.o. Female) Treating RN: Baruch Gouty, RN, BSN, Velva Harman Primary Care Physician: Ria Bush Other Clinician: Referring Physician: Ria Bush Treating Physician/Extender: Frann Rider in Treatment: 27 Constitutional . Pulse regular. Respirations normal and unlabored. Afebrile. . Eyes Nonicteric. Reactive to light. Ears, Nose, Mouth, and Throat Lips, teeth, and gums WNL.Marland Kitchen Moist mucosa without lesions . Neck supple and nontender. No palpable supraclavicular or cervical adenopathy. Normal sized without goiter. Respiratory WNL. No retractions.. Cardiovascular Pedal Pulses WNL. No clubbing, cyanosis or edema. Lymphatic No adneopathy. No adenopathy. No adenopathy. Musculoskeletal Adexa without tenderness or enlargement.. Digits and nails w/o clubbing, cyanosis, infection, petechiae, ischemia, or inflammatory conditions.. Integumentary (Hair, Skin) No suspicious lesions. No crepitus or fluctuance. No peri-wound warmth or erythema. No masses.Marland Kitchen Psychiatric Judgement and insight Intact.. No evidence of depression, anxiety, or agitation.. Notes the wound looks clean and there is minimal slough which has been clamped out with saline and gauze. Electronic Signature(s) Signed: 02/20/2015 3:29:57 PM By: Christin Fudge MD, FACS Entered By: Christin Fudge on 02/20/2015 15:29:57 [name],  [name] [name] -------------------------------------------------------------------------------- Physician Orders Details Patient Name: [name] [name] C. Date of Service: 02/20/2015 2:45 PM Medical  Record Number: [id number] Patient Account Number: [id number] Date of Birth/Sex: 1928/03/18 (81 y.o. Female) Treating RN: Afful, RN, BSN, Velva Harman Primary Care Physician: Ria Bush Other Clinician: Referring Physician: Ria Bush Treating Physician/Extender: Frann Rider in Treatment: 4 Verbal / Phone Orders: Yes Clinician: Afful, RN, BSN, Rita Read Back and Verified: Yes Diagnosis Coding Wound Cleansing Wound #1 Right,Medial Lower Leg o Cleanse wound with mild soap and water Anesthetic Wound #1 Right,Medial Lower Leg o Topical Lidocaine 4% cream applied to wound bed prior to debridement Primary Wound Dressing Wound #1 Right,Medial Lower Leg o Aquacel Ag - Foam Dressing Change Frequency Wound #1 Right,Medial Lower Leg o Change dressing every week Follow-up Appointments Wound #1 Right,Medial Lower Leg o Return Appointment in 2 weeks. Edema Control Wound #1 Right,Medial Lower Leg o 2 Layer Compression System - Right Lower Extremity Electronic Signature(s) Signed: 02/20/2015 3:20:08 PM By: Regan Lemming BSN, RN Signed: 02/20/2015 4:42:27 PM By: Christin Fudge MD, FACS Entered By: Regan Lemming on 02/20/2015 15:20:07 [name] [name] [name] ([id number]) -------------------------------------------------------------------------------- Problem List Details Patient Name: [name] [name] C. Date of Service: 02/20/2015 2:45 PM Medical Record Number: [id number] Patient Account Number: [id number] Date of Birth/Sex: October 13, 1927 (81 y.o. Female) Treating RN: Baruch Gouty, RN, BSN, Velva Harman Primary Care Physician: Ria Bush Other Clinician: Referring Physician: Ria Bush Treating Physician/Extender: Frann Rider in Treatment: 27 Active  Problems ICD-10 Encounter Code Description Active Date Diagnosis E11.622 Type 2 diabetes mellitus with other skin ulcer 08/15/2014 Yes E11.610 Type 2 diabetes mellitus with diabetic neuropathic 08/15/2014 Yes arthropathy I87.331 Chronic venous hypertension (idiopathic) with ulcer and 08/15/2014 Yes inflammation of right lower extremity L03.115 Cellulitis of right lower limb 08/29/2014 Yes S41.112A Laceration without foreign body of left upper arm, initial 12/12/2014 Yes encounter Inactive Problems Resolved Problems Electronic Signature(s) Signed: 02/20/2015 3:29:04 PM By: Christin Fudge MD, FACS Entered By: Christin Fudge on 02/20/2015 15:29:03 [name] [name] [name] ([id number]) -------------------------------------------------------------------------------- Progress Note Details Patient Name: [name] [name] C. Date of Service: 02/20/2015 2:45 PM Medical Record Number: [id number] Patient Account Number: [id number] Date of Birth/Sex: May 06, 1927 (81 y.o. Female) Treating RN: Baruch Gouty, RN, BSN, Velva Harman Primary Care Physician: Ria Bush Other Clinician: Referring Physician: Ria Bush Treating Physician/Extender: Frann Rider in Treatment: 27 Subjective Chief Complaint Information obtained from Patient Patient presents for treatment of an open ulcer due to venous insufficiency. 81 year old patient who is known to me from the [facility] wound care center now comes in follow up here for a chronic venous ulceration of her right lower extremity which she's had for about 6 months. History of Present Illness (HPI) The following HPI elements were documented for the patient's wound: Location: ulcer in the right lower extremity for several months Quality: Patient reports experiencing a dull pain to affected area(s). Severity: Patient states wound (s) are getting better. Duration: Patient has had the wound for > 3 months prior to seeking treatment at the wound center Timing: Pain in  wound is Intermittent (comes and goes Context: The wound appeared gradually over time Modifying Factors: Consults to this date include: vascular surgery for the veins in December 2015 and she's been seeing the wound care center at [facility] for several months. Associated Signs and Symptoms: Patient reports having difficulty standing for long periods. This is an 81 yrs old female we have been following since the beginning of January for a wound on the right medial ankle. She had previously had venous ablation of the greater saphenous vein by Dr. Kellie Simmering. She came to [place] with erythema of the right leg  and subsequently is opened in the medial aspect of the right ankle. She has not been very compliant with compression. We ordered her at juxta lite stocking, however she apparently has not been able to wear it. The area on the medial aspect of her left ankle has much less induration and erythema. The edema is better controlled. No evidence of infection is noted. 07/18/14; no major change in the condition of this wound. There is still surface slough and probably some drainage. 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 All other veins showed no evidence of DVT, superficial venous thrombosis or incompetence. however she does have manifestations of chronic venous hypertension and I believe we will change her dressing over to Prisma and a Profore light dressing. she sees her primary care doctor regularly and her last hemoglobin A1c has been around 7%.her other comorbidities are hypertension, CABG in 1998, peptic ulcer disease, history of echo with a ejection fraction of 55-60% and grade 1 diastolic dysfunction. past surgical history include coronary artery bypass graft in 1998, cholecystectomy, appendectomy, hand surgery, endovenous ablation of the saphenous vein with laser by Dr. Kellie Simmering  in December 2015. CARLE, VANDAELE ([id number]) 08/29/2014 - she complains of increased calf pain over the past week. removed her compression bandage and pain improved. no fever or chills. Mild clear drainage. 09/04/2014 -- she has not been compliant and has not done a dressing change as we had ordered last week. She always complains and does not keep her wrap on and this past week she has done the same. She has completed a course of doxycycline. 09/25/2014 -- she has tolerated her dressing all week and has not removed it. She says it's hurting a lot today and would not like any debridement. We will do so as per her wishes. she did get a appointment with the vascular surgeon and sees him on 6/28. 10/03/2014 - She was seen by Dr. Tinnie Gens 09/30/2014. Assessment: I reviewed the previous venous duplex exam study performed in September 2015 which reveals no options for treating this venous ulcer. There is no significant superficial venous reflux to treat with laser ablation.Most recent ABI was 0.91 in the right foot with palpable dorsalis pedis pulse at 3+ Plan: patient needs to return to the [place] wound center and be treated by Dr. Con Memos. No arterial or venous options available from vascular standpoint or treatment of right leg stasis ulcer.Continued dressing changes and compression is the treatment of choice 10/16/2014 -- today will be her first application of Apligraf. 10/23/2014 -- we are going to be able to apply her second day of Apligraf next week. She is complaining of some tenderness in the region of Achilles tendon and this is possibly due to her wrap. 11/06/2014 -- she did not have Apligraf last week because the wound was not looking good but she says she is feeling much better now. 11/13/2014 -- she has been doing well this week and she is here for her second Apligraf. 11/27/2014 -- she is here for her third application of Apligraf 12/12/2014 -- she is here for her fourth  application of Apligraf. Also during these past 3 days she had an injury to her left forearm which she caught against the edge of her desk and has a lacerated wound which has gone down to the subcutaneous tissue. 12/26/2014 -- she is here for a  last/fifth application of Apligraf. Objective Constitutional Pulse regular. Respirations normal and unlabored. Afebrile. Vitals Time Taken: 2:53 PM, Height: 58 in, Weight: 151 lbs, BMI: 31.6, Temperature: 98.1 F, Pulse: 75 bpm, Respiratory Rate: 18 breaths/min, Blood Pressure: 122/58 mmHg. Eyes Nonicteric. Reactive to light. Ears, Nose, Mouth, and Throat Lucena, Azani C. ([id number]) Lips, teeth, and gums WNL.Marland Kitchen Moist mucosa without lesions . Neck supple and nontender. No palpable supraclavicular or cervical adenopathy. Normal sized without goiter. Respiratory WNL. No retractions.. Cardiovascular Pedal Pulses WNL. No clubbing, cyanosis or edema. Lymphatic No adneopathy. No adenopathy. No adenopathy. Musculoskeletal Adexa without tenderness or enlargement.. Digits and nails w/o clubbing, cyanosis, infection, petechiae, ischemia, or inflammatory conditions.Marland Kitchen Psychiatric Judgement and insight Intact.. No evidence of depression, anxiety, or agitation.. General Notes: the wound looks clean and there is minimal slough which has been clamped out with saline and gauze. Integumentary (Hair, Skin) No suspicious lesions. No crepitus or fluctuance. No peri-wound warmth or erythema. No masses.. Wound #1 status is Open. Original cause of wound was Gradually Appeared. The wound is located on the Right,Medial Lower Leg. The wound measures 6cm length x 1.5cm width x 0.2cm depth; 7.069cm^2 area and 1.414cm^3 volume. The wound is limited to skin breakdown. There is no tunneling or undermining noted. There is a medium amount of serosanguineous drainage noted. The wound margin is distinct with the outline attached to the wound base. There is large (67-100%)  red granulation within the wound bed. There is a small (1-33%) amount of necrotic tissue within the wound bed including Adherent Slough. The periwound skin appearance exhibited: Moist, Mottled. The periwound skin appearance did not exhibit: Callus, Crepitus, Excoriation, Fluctuance, Friable, Induration, Localized Edema, Rash, Scarring, Dry/Scaly, Maceration, Atrophie Blanche, Cyanosis, Ecchymosis, Hemosiderin Staining, Pallor, Rubor, Erythema. Periwound temperature was noted as No Abnormality. The periwound has tenderness on palpation. Assessment Active Problems ICD-10 E11.622 - Type 2 diabetes mellitus with other skin ulcer E11.610 - Type 2 diabetes mellitus with diabetic neuropathic arthropathy I87.331 - Chronic venous hypertension (idiopathic) with ulcer and inflammation of right lower extremity Rudell, Raechelle C. ([id number]) L03.115 - Cellulitis of right lower limb S41.112A - Laceration without foreign body of left upper arm, initial encounter This week we will use Aqua cell silver alginate with foam, and use 2 layer compression which is all she can tolerate. She is again encouraged to continue with exercise, elevation and good control of her diabetes. Because of the Thanksgiving holiday we'll see her soon after the coming week Plan Wound Cleansing: Wound #1 Right,Medial Lower Leg: Cleanse wound with mild soap and water Anesthetic: Wound #1 Right,Medial Lower Leg: Topical Lidocaine 4% cream applied to wound bed prior to debridement Primary Wound Dressing: Wound #1 Right,Medial Lower Leg: Aquacel Ag - Foam Dressing Change Frequency: Wound #1 Right,Medial Lower Leg: Change dressing every week Follow-up Appointments: Wound #1 Right,Medial Lower Leg: Return Appointment in 2 weeks. Edema Control: Wound #1 Right,Medial Lower Leg: 2 Layer Compression System - Right Lower Extremity This week we will use Aqua cell silver alginate with foam, and use 2 layer compression which is all  she can tolerate. She is again encouraged to continue with exercise, elevation and good control of her diabetes. Because of the Thanksgiving holiday we'll see her soon after the coming week URIAH, SAIF ([id number]) Electronic Signature(s) Signed: 02/20/2015 3:31:03 PM By: Christin Fudge MD, FACS Entered By: Christin Fudge on 02/20/2015 15:31:03 [name] [name] [name] ([id number]) -------------------------------------------------------------------------------- SuperBill Details Patient Name: Debose, Ludie C. Date of Service: 02/20/2015 Medical Record Number: [id number]  Patient Account Number: [id number] Date of Birth/Sex: Jan 28, 1928 (81 y.o. Female) Treating RN: Afful, RN, BSN, Velva Harman Primary Care Physician: Ria Bush Other Clinician: Referring Physician: Ria Bush Treating Physician/Extender: Frann Rider in Treatment: 27 Diagnosis Coding ICD-10 Codes Code Description E11.622 Type 2 diabetes mellitus with other skin ulcer E11.610 Type 2 diabetes mellitus with diabetic neuropathic arthropathy Chronic venous hypertension (idiopathic) with ulcer and inflammation of right lower I87.331 extremity L03.115 Cellulitis of right lower limb S41.112A Laceration without foreign body of left upper arm, initial encounter Physician Procedures CPT4: Description Modifier Quantity Code [id number] - WC PHYS LEVEL 3 - EST PT 1 ICD-10 Description Diagnosis E11.622 Type 2 diabetes mellitus with other skin ulcer E11.610 Type 2 diabetes mellitus with diabetic neuropathic arthropathy I87.331 Chronic  venous hypertension (idiopathic) with ulcer and inflammation of right lower extremity Electronic Signature(s) Signed: 02/20/2015 3:31:43 PM By: Christin Fudge MD, FACS Entered By: Christin Fudge on 02/20/2015 15:31:43

## 2015-03-02 ENCOUNTER — Encounter: Payer: Self-pay | Admitting: Endocrinology

## 2015-03-02 ENCOUNTER — Ambulatory Visit (INDEPENDENT_AMBULATORY_CARE_PROVIDER_SITE_OTHER): Payer: Medicare Other | Admitting: Endocrinology

## 2015-03-02 VITALS — BP 148/64 | HR 77 | Temp 98.2°F | Resp 14 | Ht <= 58 in | Wt 154.0 lb

## 2015-03-02 DIAGNOSIS — I2510 Atherosclerotic heart disease of native coronary artery without angina pectoris: Secondary | ICD-10-CM | POA: Diagnosis not present

## 2015-03-02 DIAGNOSIS — Z23 Encounter for immunization: Secondary | ICD-10-CM

## 2015-03-02 DIAGNOSIS — E1149 Type 2 diabetes mellitus with other diabetic neurological complication: Secondary | ICD-10-CM | POA: Diagnosis not present

## 2015-03-02 LAB — POCT GLYCOSYLATED HEMOGLOBIN (HGB A1C): HEMOGLOBIN A1C: 7

## 2015-03-02 NOTE — Progress Notes (Signed)
[name], female   DOB: [birth year], 81 y.o.   MRN: [id number]   Reason for Appointment: Diabetes follow-up   History of Present Illness   Diagnosis: Type 2 DIABETES MELITUS, date of diagnosis: 1991   She has had mild diabetes for several years and has been on Actos only since about 2001 Her A1c has generally been under 7% She was given a trial of metformin because of her difficulty with weight loss and also edema but she felt that it made her blood sugars go up and will not try it again Also she thinks her blood sugars were higher with a trial of Januvia  She is currently taking Actos and may have had less edema with reducing dose to 30 mg since 01/2014  Again she is is only checking her sugar in the afternoon randomly and did not bring any record or her monitor today A1c has generally been near 7% and stable She has difficulty with exercise because of leg pain Her weight is stable even though she is concerned about weight gain     Oral hypoglycemic drugs: Actos 30 mg daily        Side effects from medications: None Monitors blood glucose: ?  Frequency .    Glucometer: One Touch.          Blood Glucose readings from recall: 99-120, usually checked at 3 PM  Hypoglycemia frequency: Never.           Meals: 2  meals per day.  usually compliant with diet         Physical activity:  minimal           Dietician visit: Most recent: Several years ago             Wt Readings from Last 3 Encounters:  03/02/15 154 lb (69.854 kg)  01/23/15 152 lb 4 oz (69.06 kg)  11/12/14 153 lb (69.4 kg)   Lab Results  Component Value Date   HGBA1C 7 03/02/2015   HGBA1C 6.7* 10/31/2014   HGBA1C 7.0* 06/09/2014   Lab Results  Component Value Date   MICROALBUR 0.2 03/10/2014   LDLCALC 60 09/23/2014   CREATININE 0.76 01/23/2015       Medication List       This list is accurate as of: 03/02/15 11:59 PM.  Always use your most recent med list.               ALPRAZolam 1 MG tablet  Commonly known as:  XANAX  Take 0.5 mg by mouth 4 (four) times daily. For anxiety     aspirin EC 81 MG tablet  Take 81 mg by mouth daily.     CALCIUM PO  Take 600 mg by mouth daily.     DEXILANT 30 MG capsule  Generic drug:  Dexlansoprazole  TAKE 1 CAPSULE BY MOUTH DAILY     DEXILANT 30 MG capsule  Generic drug:  Dexlansoprazole  TAKE 1 CAPSULE BY MOUTH DAILY     ferrous sulfate 325 (65 FE) MG tablet  Take 1 tablet (325 mg total) by mouth daily with breakfast.     furosemide 20 MG tablet  Commonly known as:  LASIX  TAKE 1 TABLET (20 MG TOTAL) BY MOUTH DAILY. FOR LEG SWELLING     glucosamine-chondroitin 500-400 MG tablet  Take 1 tablet by mouth daily.     glucose blood test strip  Commonly known as:  ONE TOUCH ULTRA TEST  Use as instructed to check blood sugar once a day dx code E11.40     HYDROcodone-acetaminophen 5-325 MG tablet  Commonly known as:  NORCO/VICODIN  Take 0.5-1 tablets by mouth every 6 (six) hours as needed for moderate pain.     loperamide 2 MG tablet  Commonly known as:  IMODIUM A-D  Take 2 mg by mouth 4 (four) times daily as needed for diarrhea or loose stools.     nitroGLYCERIN 0.4 MG SL tablet  Commonly known as:  NITROSTAT  Place 0.4 mg under the tongue every 5 (five) minutes as needed. For chest pain May take up to 3 doses, if pain persists call 911.     nystatin-triamcinolone ointment  Commonly known as:  MYCOLOG  APPLY 1 APPLICATION TOPICALLY 2 (TWO) TIMES DAILY. AS NEEDED FOR ITCHING     pioglitazone 30 MG tablet  Commonly known as:  ACTOS  Take 30 mg by mouth daily. For diabetes     pioglitazone 30 MG tablet  Commonly known as:  ACTOS  TAKE 1 TABLET BY MOUTH EVERY DAY     pravastatin 20 MG tablet  Commonly known as:  PRAVACHOL  TAKE 1 TABLET BY MOUTH DAILY.     ramipril 5 MG capsule  Commonly known as:  ALTACE  Take one capsule everyday for blood pressure     rOPINIRole 1 MG tablet  Commonly known as:   REQUIP  Take 1 mg by mouth 3 (three) times daily.     traZODone 150 MG tablet  Commonly known as:  DESYREL  Take 150 mg by mouth at bedtime. For sleep     triamcinolone cream 0.1 %  Commonly known as:  KENALOG  Apply 1 application topically 2 (two) times daily.     VITAMIN B 12 PO  Take 1 tablet by mouth daily.     VITAMIN C PO  Take 1,000 mg by mouth daily.     VITAMIN E PO  Take 1 tablet by mouth daily.        Allergies:  Allergies  Allergen Reactions  . Amoxicillin-Pot Clavulanate Diarrhea  . Metformin And Related Diarrhea    Past Medical History  Diagnosis Date  . Hypertension   . Coronary artery disease     CABG 1998  . Anxiety   . Diastolic dysfunction   . Hyperlipidemia   . PUD (peptic ulcer disease)   . Microcytic anemia   . Depression   . Diabetes mellitus     type 2, followed by endo.  . Diverticulosis   . Hemorrhoids   . History of heart attack   . Irritable bowel syndrome   . Chronic back pain 03/2012    lumbar DDD with herniation - s/p L S1 transforaminal ESI (10/2012 Dr. Sharlet Salina)  . Varicose veins   . Cystocele   . Hx of echocardiogram     a. Echo 10/13: mild LVH, EF 55-60%, Gr 1 diast dysfn, PASP 32  . Acute deep vein thrombosis (DVT) of distal vein of right lower extremity ([place]) 12/2013    Acute thrombus of R proximal gastrocnemius vein. Symptomatic provoked distal DVT  . Cellulitis of leg, left 01/16/2014  . PAD (peripheral artery disease) ([place]) 2013    ABI: R 0.91, L 0.83  . Chronic venous insufficiency     Past Surgical History  Procedure Laterality Date  . Tympanoplasty  1960s    R side  . Coronary artery bypass graft  1998  . Cataract surgery    .  Cholecystectomy    . Appendectomy      per pt report  . Colonoscopy  02/2010    extensive diverticulosis throughout, small internal hemorrhoids, rec rpt 5 yrs (Dr. Allyn Kenner)  . Hand surgery    . Lexiscan myoview  03/2011    Small basal inferolateral and anterolateral reversible  perfusion defect suggests ischemia.  EF was normal.   old infarct, no new ischemia  . Toe surgery    . Abdominal hysterectomy  1975    TAH,BSO  . Vault prolapse a & p repair  2009    Dr [facility] hospital  . Dexa scan  09/2011    femur -0.8, forearm 0.6 => normal  . Esi  2014    L S1 transforaminal ESI x2 ([place]) without improvement  . Abi  2013    R 0.91, L 0.83  . Endovenous ablation saphenous vein w/ laser Left 03/2014    Kellie Simmering    Family History  Problem Relation Age of Onset  . Hypertension Mother   . Stroke Mother   . Diabetes Father   . Diabetes Sister   . Diabetes Brother   . Colon cancer Neg Hx     Social History:  reports that she quit smoking about 36 years ago. She has never used smokeless tobacco. She reports that she does not drink alcohol or use illicit drugs.  Review of Systems:  She is complaining of fatigue.  Also is having a lot of stress issues  Leg ulcer on the right lower leg: she is still on treatment with wound center, has not had complete healing Arterial Doppler studies were not significantly abnormal  HYPERTENSION:  she is having good control usually with ramipril although has had variable readings in the recent few weeks Also takes Lasix for edema  No microalbuminuria present and has normal renal function and potassium with this.  Lab Results  Component Value Date   CREATININE 0.76 01/23/2015   BUN 22 01/23/2015   NA 139 01/23/2015   K 4.5 01/23/2015   CL 102 01/23/2015   CO2 30 01/23/2015     HYPERLIPIDEMIA: The lipid abnormality consists of elevated LDL treated with pravastatin low-dose, followed by PCP.  Lab Results  Component Value Date   CHOL 120 09/23/2014   HDL 44.00 09/23/2014   LDLCALC 60 09/23/2014   TRIG 82.0 09/23/2014   CHOLHDL 3 09/23/2014    Last foot exam in 06/2014     Examination:   BP 148/64 mmHg  Pulse 77  Temp(Src) 98.2 F (36.8 C)  Resp 14  Ht 4\' 10"  (1.473 m)  Wt 154 lb (69.854 kg)  BMI  32.19 kg/m2  SpO2 98%  Body mass index is 32.19 kg/(m^2).   1+ right lower leg edema present  ASSESSMENT/ PLAN:   The patient's diabetes control is appearing fairly good with home blood sugars usually normal A1c is 7% which is excellent for her age Has been on Actos monotherapy for years with stable control; doing fairly well with 30 mg dose She has difficulty losing weight because of her inability to exercise  She was reminded about needing to check blood sugars at various times of the day Discussed continuing to have balanced meals with low fat intake  She will bring her monitor for review on the next visit which she has not done despite repeated reminders     HYPERTENSION: This is mild and well controlled   No history of proteinuria    [name] 03/03/2015,  8:07 AM

## 2015-03-02 NOTE — Patient Instructions (Signed)
Check blood sugars on waking up 1-2   times a week Also check blood sugars about 2 hours after a meal and do this after different meals by rotation  Recommended blood sugar levels on waking up is 90-130 and about 2 hours after meal is 130-160  Please bring your blood sugar monitor to each visit, thank you  

## 2015-03-04 ENCOUNTER — Other Ambulatory Visit: Payer: Self-pay

## 2015-03-04 NOTE — Telephone Encounter (Signed)
Pt left v/m requesting rx hydrocodone apap. Call when ready for pick up. rx last printed # 60 on 01/27/15. Last acute visit 01/23/15 and last saw Dr Darnell Level on 09/02/14.

## 2015-03-05 MED ORDER — HYDROCODONE-ACETAMINOPHEN 5-325 MG PO TABS: 0.5000 | ORAL_TABLET | Freq: Four times a day (QID) | ORAL | Status: DC | PRN

## 2015-03-05 NOTE — Telephone Encounter (Signed)
Printed.  Thanks.  

## 2015-03-05 NOTE — Telephone Encounter (Signed)
Patient advised.  Rx left at front desk for pick up. 

## 2015-03-06 ENCOUNTER — Encounter: Payer: Medicare Other | Attending: Surgery | Admitting: Surgery

## 2015-03-06 DIAGNOSIS — E11622 Type 2 diabetes mellitus with other skin ulcer: Secondary | ICD-10-CM | POA: Insufficient documentation

## 2015-03-06 DIAGNOSIS — L97211 Non-pressure chronic ulcer of right calf limited to breakdown of skin: Secondary | ICD-10-CM | POA: Insufficient documentation

## 2015-03-06 DIAGNOSIS — I10 Essential (primary) hypertension: Secondary | ICD-10-CM | POA: Insufficient documentation

## 2015-03-06 DIAGNOSIS — S41112A Laceration without foreign body of left upper arm, initial encounter: Secondary | ICD-10-CM | POA: Diagnosis not present

## 2015-03-06 DIAGNOSIS — X58XXXA Exposure to other specified factors, initial encounter: Secondary | ICD-10-CM | POA: Insufficient documentation

## 2015-03-06 DIAGNOSIS — I87331 Chronic venous hypertension (idiopathic) with ulcer and inflammation of right lower extremity: Secondary | ICD-10-CM | POA: Insufficient documentation

## 2015-03-06 DIAGNOSIS — E11610 Type 2 diabetes mellitus with diabetic neuropathic arthropathy: Secondary | ICD-10-CM | POA: Diagnosis not present

## 2015-03-08 ENCOUNTER — Other Ambulatory Visit: Payer: Self-pay | Admitting: Family Medicine

## 2015-03-11 NOTE — Progress Notes (Signed)
KEVIN, BROWNELL ([id number]) Visit Report for 03/06/2015 Chief Complaint Document Details Patient Name: Crislip, Syerra C. Date of Service: 03/06/2015 3:00 PM Medical Record Number: [id number] Patient Account Number: [id number] Date of Birth/Sex: 11-Jul-1927 (81 y.o. Female) Treating RN: Cornell Barman Primary Care Physician: Ria Bush Other Clinician: Referring Physician: Ria Bush Treating Physician/Extender: Frann Rider in Treatment: 29 Information Obtained from: Patient Chief Complaint Patient presents for treatment of an open ulcer due to venous insufficiency. 81 year old patient who is known to me from the  [facility] wound care center now comes in follow up here for a chronic venous ulceration of her right lower extremity which she's had for about 6 months. Electronic Signature(s) Signed: 03/06/2015 4:04:54 PM By: Christin Fudge MD, FACS Entered By: Christin Fudge on 03/06/2015 16:04:54 Burkel, [name] ([id number]) -------------------------------------------------------------------------------- Debridement Details Patient Name: Mcferran, Judieth C. Date of Service: 03/06/2015 3:00 PM Medical Record Number: [id number] Patient Account Number: [id number] Date of Birth/Sex: 12/10/1927 (81 y.o. Female) Treating RN: Cornell Barman Primary Care Physician: Ria Bush Other Clinician: Referring Physician: Ria Bush Treating Physician/Extender: Frann Rider in Treatment: 29 Debridement Performed for Wound #1 Right,Medial Lower Leg Assessment: Performed By: Physician Christin Fudge, MD Debridement: Debridement Pre-procedure Yes Verification/Time Out Taken: Start Time: 15:58 Pain Control: Other : lidocaine 4% cream Level: Skin/Subcutaneous Tissue Total Area Debrided (L x 5.4 (cm) x 1.2 (cm) = 6.48 (cm) W): Tissue and other Viable, Non-Viable, Exudate, Fibrin/Slough, Subcutaneous material debrided: Instrument: Curette Bleeding:  Moderate Hemostasis Achieved: Pressure End Time: 16:00 Procedural Pain: 3 Post Procedural Pain: 4 Response to Treatment: Procedure was tolerated well Post Debridement Measurements of Total Wound Length: (cm) 5.4 Width: (cm) 1.2 Depth: (cm) 0.4 Volume: (cm) 2.036 Post Procedure Diagnosis Same as Pre-procedure Electronic Signature(s) Signed: 03/06/2015 4:04:47 PM By: Christin Fudge MD, FACS Signed: 03/09/2015 5:12:08 PM By: Gretta Cool RN, BSN, Kim RN, BSN Entered By: Christin Fudge on 03/06/2015 16:04:47 Kraszewski, Blaze C. ([id number]) -------------------------------------------------------------------------------- HPI Details Patient Name: Stamey, Sharran C. Date of Service: 03/06/2015 3:00 PM Medical Record Number: [id number] Patient Account Number: [id number] Date of Birth/Sex: November 01, 1927 (81 y.o. Female) Treating RN: Cornell Barman Primary Care Physician: Ria Bush Other Clinician: Referring Physician: Ria Bush Treating Physician/Extender: Frann Rider in Treatment: 29 History of Present Illness Location: ulcer in the right lower extremity for several months Quality: Patient reports experiencing a dull pain to affected area(s). Severity: Patient states wound (s) are getting better. Duration: Patient has had the wound for > 3 months prior to seeking treatment at the wound center Timing: Pain in wound is Intermittent (comes and goes Context: The wound appeared gradually over time Modifying Factors: Consults to this date include: vascular surgery for the veins in December 2015 and she's been seeing the wound care center at [facility] for several months. Associated Signs and Symptoms: Patient reports having difficulty standing for long periods. HPI Description: This is an 81 yrs old female we have been following since the beginning of January for a wound on the right medial ankle. She had previously had venous ablation of the greater saphenous vein by Dr. Kellie Simmering. She  came to [place] with erythema of the right leg and subsequently is opened in the medial aspect of the right ankle. She has not been very compliant with compression. We ordered her at juxta lite stocking, however she apparently has not been able to wear it. The area on the medial aspect of her left ankle has much less induration and erythema. The edema is better controlled. No evidence of  infection is noted. 07/18/14; no major change in the condition of this wound. There is still surface slough and probably some drainage. 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 All other veins showed no evidence of DVT, superficial venous thrombosis or incompetence. however she does have manifestations of chronic venous hypertension and I believe we will change her dressing over to Prisma and a Profore light dressing. she sees her primary care doctor regularly and her last hemoglobin A1c has been around 7%.her other comorbidities are hypertension, CABG in 1998, peptic ulcer disease, history of echo with a ejection fraction of 55-60% and grade 1 diastolic dysfunction. past surgical history include coronary artery bypass graft in 1998, cholecystectomy, appendectomy, hand surgery, endovenous ablation of the saphenous vein with laser by Dr. Kellie Simmering in December 2015. 08/29/2014 - she complains of increased calf pain over the past week. removed her compression bandage and pain improved. no fever or chills. Mild clear drainage. 09/04/2014 -- she has not been compliant and has not done a dressing change as we had ordered last week. She always complains and does not keep her wrap on and this past week she has done the same. She has completed a course of doxycycline. 09/25/2014 -- she has tolerated her dressing all week and has not removed it. She says it's hurting a lot today and would not like any  debridement. We will do so as per her wishes. she did get a appointment with the vascular surgeon and sees him on 6/28. KEELEY, SZYDLO ([id number]) 10/03/2014 - She was seen by Dr. Tinnie Gens 09/30/2014. Assessment: I reviewed the previous venous duplex exam study performed in September 2015 which reveals no options for treating this venous ulcer. There is no significant superficial venous reflux to treat with laser ablation.Most recent ABI was 0.91 in the right foot with palpable dorsalis pedis pulse at 3+ Plan: patient needs to return to the [place] wound center and be treated by Dr. Con Memos. No arterial or venous options available from vascular standpoint or treatment of right leg stasis ulcer.Continued dressing changes and compression is the treatment of choice 10/16/2014 -- today will be her first application of Apligraf. 10/23/2014 -- we are going to be able to apply her second day of Apligraf next week. She is complaining of some tenderness in the region of Achilles tendon and this is possibly due to her wrap. 11/06/2014 -- she did not have Apligraf last week because the wound was not looking good but she says she is feeling much better now. 11/13/2014 -- she has been doing well this week and she is here for her second Apligraf. 11/27/2014 -- she is here for her third application of Apligraf 12/12/2014 -- she is here for her fourth application of Apligraf. Also during these past 3 days she had an injury to her left forearm which she caught against the edge of her desk and has a lacerated wound which has gone down to the subcutaneous tissue. 12/26/2014 -- she is here for a last/fifth application of Apligraf. Electronic Signature(s) Signed: 03/06/2015 4:05:16 PM By: Christin Fudge MD, FACS Entered By: Christin Fudge on 03/06/2015 16:05:16 Skidmore, [name] ([id number]) -------------------------------------------------------------------------------- Physical Exam Details Patient  Name: Maybury, Maressa C. Date of Service: 03/06/2015 3:00 PM Medical Record Number: [id number] Patient Account Number: [id number] Date of Birth/Sex: 1927-09-28 (81 y.o. Female) Treating RN: Cornell Barman Primary  Care Physician: Ria Bush Other Clinician: Referring Physician: Ria Bush Treating Physician/Extender: Frann Rider in Treatment: 29 Constitutional . Pulse regular. Respirations normal and unlabored. Afebrile. . Eyes Nonicteric. Reactive to light. Ears, Nose, Mouth, and Throat Lips, teeth, and gums WNL.Marland Kitchen Moist mucosa without lesions . Neck supple and nontender. No palpable supraclavicular or cervical adenopathy. Normal sized without goiter. Respiratory WNL. No retractions.. Cardiovascular Pedal Pulses WNL. No clubbing, cyanosis or edema. Lymphatic No adneopathy. No adenopathy. No adenopathy. Musculoskeletal Adexa without tenderness or enlargement.. Digits and nails w/o clubbing, cyanosis, infection, petechiae, ischemia, or inflammatory conditions.. Integumentary (Hair, Skin) No suspicious lesions. No crepitus or fluctuance. No peri-wound warmth or erythema. No masses.Marland Kitchen Psychiatric Judgement and insight Intact.. No evidence of depression, anxiety, or agitation.. Notes the wound has more debris and has significant order as she has not changed her dressing for 2 weeks. After sharp dissection and curettage wound looks healthier and we will continue with local care and compression. Electronic Signature(s) Signed: 03/06/2015 4:06:19 PM By: Christin Fudge MD, FACS Entered By: Christin Fudge on 03/06/2015 16:06:19 Ramiro, [name] ([id number]) -------------------------------------------------------------------------------- Physician Orders Details Patient Name: Arwood, Zabdi C. Date of Service: 03/06/2015 3:00 PM Medical Record Number: [id number] Patient Account Number: [id number] Date of Birth/Sex: 09-01-1927 (81 y.o. Female) Treating RN: Carolyne Fiscal,  Debi Primary Care Physician: Ria Bush Other Clinician: Referring Physician: Ria Bush Treating Physician/Extender: Frann Rider in Treatment: 48 Verbal / Phone Orders: Yes Clinician: Carolyne Fiscal, Debi Read Back and Verified: Yes Diagnosis Coding Wound Cleansing Wound #1 Right,Medial Lower Leg o Cleanse wound with mild soap and water Anesthetic Wound #1 Right,Medial Lower Leg o Topical Lidocaine 4% cream applied to wound bed prior to debridement Skin Barriers/Peri-Wound Care Wound #1 Right,Medial Lower Leg o Skin Prep Primary Wound Dressing Wound #1 Right,Medial Lower Leg o Other: - sorbact with hydrogel Secondary Dressing Wound #1 Right,Medial Lower Leg o ABD pad Dressing Change Frequency Wound #1 Right,Medial Lower Leg o Change dressing every week Follow-up Appointments Wound #1 Right,Medial Lower Leg o Return Appointment in 1 week. Edema Control Wound #1 Right,Medial Lower Leg o 2 Layer Compression System - Right Lower Extremity - mepitel lite on heel Electronic Signature(s) ELISABEL, LESUEUR ([id number]) Signed: 03/06/2015 4:40:44 PM By: Christin Fudge MD, FACS Signed: 03/10/2015 6:00:56 PM By: Alric Quan Entered By: Alric Quan on 03/06/2015 16:04:12 Agner, [name] ([id number]) -------------------------------------------------------------------------------- Problem List Details Patient Name: Jou, Talynn C. Date of Service: 03/06/2015 3:00 PM Medical Record Number: [id number] Patient Account Number: [id number] Date of Birth/Sex: [birth year] (81 y.o. Female) Treating RN: Cornell Barman Primary Care Physician: Ria Bush Other Clinician: Referring Physician: Ria Bush Treating Physician/Extender: Frann Rider in Treatment: 29 Active Problems ICD-10 Encounter Code Description Active Date Diagnosis E11.622 Type 2 diabetes mellitus with other skin ulcer 08/15/2014 Yes E11.610 Type 2 diabetes  mellitus with diabetic neuropathic 08/15/2014 Yes arthropathy I87.331 Chronic venous hypertension (idiopathic) with ulcer and 08/15/2014 Yes inflammation of right lower extremity S41.112A Laceration without foreign body of left upper arm, initial 12/12/2014 Yes encounter Inactive Problems Resolved Problems ICD-10 Code Description Active Date Resolved Date L03.115 Cellulitis of right lower limb 08/29/2014 08/29/2014 Electronic Signature(s) Signed: 03/06/2015 4:04:36 PM By: Christin Fudge MD, FACS Entered By: Christin Fudge on 03/06/2015 16:04:36 Sirico, [name] ([id number]) -------------------------------------------------------------------------------- Progress Note Details Patient Name: Chiusano, Bethannie C. Date of Service: 03/06/2015 3:00 PM Medical Record Number: [id number] Patient Account Number: [id number] Date of Birth/Sex: 11-19-1927 (81 y.o. Female) Treating RN: Cornell Barman Primary Care Physician: Ria Bush Other Clinician:  Referring Physician: Ria Bush Treating Physician/Extender: Frann Rider in Treatment: 29 Subjective Chief Complaint Information obtained from Patient Patient presents for treatment of an open ulcer due to venous insufficiency. 81 year old patient who is known to me from the [facility] wound care center now comes in follow up here for a chronic venous ulceration of her right lower extremity which she's had for about 6 months. History of Present Illness (HPI) The following HPI elements were documented for the patient's wound: Location: ulcer in the right lower extremity for several months Quality: Patient reports experiencing a dull pain to affected area(s). Severity: Patient states wound (s) are getting better. Duration: Patient has had the wound for > 3 months prior to seeking treatment at the wound center Timing: Pain in wound is Intermittent (comes and goes Context: The wound appeared gradually over time Modifying Factors: Consults to  this date include: vascular surgery for the veins in December 2015 and she's been seeing the wound care center at [facility] for several months. Associated Signs and Symptoms: Patient reports having difficulty standing for long periods. This is an 81 yrs old female we have been following since the beginning of January for a wound on the right medial ankle. She had previously had venous ablation of the greater saphenous vein by Dr. Kellie Simmering. She came to [place] with erythema of the right leg and subsequently is opened in the medial aspect of the right ankle. She has not been very compliant with compression. We ordered her at juxta lite stocking, however she apparently has not been able to wear it. The area on the medial aspect of her left ankle has much less induration and erythema. The edema is better controlled. No evidence of infection is noted. 07/18/14; no major change in the condition of this wound. There is still surface slough and probably some drainage. 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 All other veins showed no evidence of DVT, superficial venous thrombosis or incompetence. however she does have manifestations of chronic venous hypertension and I believe we will change her dressing over to Prisma and a Profore light dressing. she sees her primary care doctor regularly and her last hemoglobin A1c has been around 7%.her other comorbidities are hypertension, CABG in 1998, peptic ulcer disease, history of echo with a ejection fraction of 55-60% and grade 1 diastolic dysfunction. past surgical history include coronary artery bypass graft in 1998, cholecystectomy, appendectomy, hand surgery, endovenous ablation of the saphenous vein with laser by Dr. Kellie Simmering in December 2015. CHISTINE, BASIL ([id number]) 08/29/2014 - she complains of increased calf pain over the past  week. removed her compression bandage and pain improved. no fever or chills. Mild clear drainage. 09/04/2014 -- she has not been compliant and has not done a dressing change as we had ordered last week. She always complains and does not keep her wrap on and this past week she has done the same. She has completed a course of doxycycline. 09/25/2014 -- she has tolerated her dressing all week and has not removed it. She says it's hurting a lot today and would not like any debridement. We will do so as per her wishes. she did get a appointment with the vascular surgeon and sees him on 6/28. 10/03/2014 - She was seen by Dr. Tinnie Gens 09/30/2014. Assessment: I reviewed the previous venous duplex exam study performed in September  2015 which reveals no options for treating this venous ulcer. There is no significant superficial venous reflux to treat with laser ablation.Most recent ABI was 0.91 in the right foot with palpable dorsalis pedis pulse at 3+ Plan: patient needs to return to the [place] wound center and be treated by Dr. Con Memos. No arterial or venous options available from vascular standpoint or treatment of right leg stasis ulcer.Continued dressing changes and compression is the treatment of choice 10/16/2014 -- today will be her first application of Apligraf. 10/23/2014 -- we are going to be able to apply her second day of Apligraf next week. She is complaining of some tenderness in the region of Achilles tendon and this is possibly due to her wrap. 11/06/2014 -- she did not have Apligraf last week because the wound was not looking good but she says she is feeling much better now. 11/13/2014 -- she has been doing well this week and she is here for her second Apligraf. 11/27/2014 -- she is here for her third application of Apligraf 12/12/2014 -- she is here for her fourth application of Apligraf. Also during these past 3 days she had an injury to her left forearm which she caught against  the edge of her desk and has a lacerated wound which has gone down to the subcutaneous tissue. 12/26/2014 -- she is here for a last/fifth application of Apligraf. Objective Constitutional Pulse regular. Respirations normal and unlabored. Afebrile. Vitals Time Taken: 3:19 PM, Height: 58 in, Weight: 151 lbs, BMI: 31.6, Temperature: 98.0 F, Pulse: 72 bpm, Respiratory Rate: 18 breaths/min, Blood Pressure: 152/66 mmHg. Eyes Nonicteric. Reactive to light. Ears, Nose, Mouth, and Throat Haberer, Floella C. ([id number]) Lips, teeth, and gums WNL.Marland Kitchen Moist mucosa without lesions . Neck supple and nontender. No palpable supraclavicular or cervical adenopathy. Normal sized without goiter. Respiratory WNL. No retractions.. Cardiovascular Pedal Pulses WNL. No clubbing, cyanosis or edema. Lymphatic No adneopathy. No adenopathy. No adenopathy. Musculoskeletal Adexa without tenderness or enlargement.. Digits and nails w/o clubbing, cyanosis, infection, petechiae, ischemia, or inflammatory conditions.Marland Kitchen Psychiatric Judgement and insight Intact.. No evidence of depression, anxiety, or agitation.. General Notes: the wound has more debris and has significant order as she has not changed her dressing for 2 weeks. After sharp dissection and curettage wound looks healthier and we will continue with local care and compression. Integumentary (Hair, Skin) No suspicious lesions. No crepitus or fluctuance. No peri-wound warmth or erythema. No masses.. Wound #1 status is Open. Original cause of wound was Gradually Appeared. The wound is located on the Right,Medial Lower Leg. The wound measures 5.4cm length x 1.2cm width x 0.3cm depth; 5.089cm^2 area and 1.527cm^3 volume. The wound is limited to skin breakdown. There is no tunneling or undermining noted. There is a medium amount of serosanguineous drainage noted. The wound margin is distinct with the outline attached to the wound base. There is no granulation  within the wound bed. There is a large (67- 100%) amount of necrotic tissue within the wound bed including Adherent Slough. The periwound skin appearance exhibited: Localized Edema, Moist, Mottled, Erythema. The periwound skin appearance did not exhibit: Callus, Crepitus, Excoriation, Fluctuance, Friable, Induration, Rash, Scarring, Dry/Scaly, Maceration, Atrophie Blanche, Cyanosis, Ecchymosis, Hemosiderin Staining, Pallor, Rubor. The surrounding wound skin color is noted with erythema which is circumferential. Periwound temperature was noted as No Abnormality. The periwound has tenderness on palpation. Assessment Active Problems ICD-10 E11.622 - Type 2 diabetes mellitus with other skin ulcer Player, Alexxis C. ([id number]) E11.610 - Type 2 diabetes mellitus with  diabetic neuropathic arthropathy I87.331 - Chronic venous hypertension (idiopathic) with ulcer and inflammation of right lower extremity S41.112A - Laceration without foreign body of left upper arm, initial encounter This week we will use Sorbact with hydrogel and with foam, and use 2 layer compression which is all she can tolerate. She is again encouraged to continue with exercise, elevation and good control of her diabetes. Procedures Wound #1 Wound #1 is a Venous Leg Ulcer located on the Right,Medial Lower Leg . There was a Skin/Subcutaneous Tissue Debridement [id number]) debridement with total area of 6.48 sq cm performed by Christin Fudge, MD. with the following instrument(s): Curette to remove Viable and Non-Viable tissue/material including Exudate, Fibrin/Slough, and Subcutaneous after achieving pain control using Other (lidocaine 4% cream). A time out was conducted prior to the start of the procedure. A Moderate amount of bleeding was controlled with Pressure. The procedure was tolerated well with a pain level of 3 throughout and a pain level of 4 following the procedure. Post Debridement Measurements: 5.4cm length x 1.2cm  width x 0.4cm depth; 2.036cm^3 volume. Post procedure Diagnosis Wound #1: Same as Pre-Procedure Plan Wound Cleansing: Wound #1 Right,Medial Lower Leg: Cleanse wound with mild soap and water Anesthetic: Wound #1 Right,Medial Lower Leg: Topical Lidocaine 4% cream applied to wound bed prior to debridement Skin Barriers/Peri-Wound Care: Wound #1 Right,Medial Lower Leg: Skin Prep Primary Wound Dressing: Wound #1 Right,Medial Lower Leg: Other: - sorbact with hydrogel Moultry, Alverta C. ([id number]) Secondary Dressing: Wound #1 Right,Medial Lower Leg: ABD pad Dressing Change Frequency: Wound #1 Right,Medial Lower Leg: Change dressing every week Follow-up Appointments: Wound #1 Right,Medial Lower Leg: Return Appointment in 1 week. Edema Control: Wound #1 Right,Medial Lower Leg: 2 Layer Compression System - Right Lower Extremity - mepitel lite on heel This week we will use Sorbact with hydrogel and with foam, and use 2 layer compression which is all she can tolerate. She is again encouraged to continue with exercise, elevation and good control of her diabetes. Electronic Signature(s) Signed: 03/06/2015 4:07:09 PM By: Christin Fudge MD, FACS Entered By: Christin Fudge on 03/06/2015 16:07:09 Dockery, [name] ([id number]) -------------------------------------------------------------------------------- SuperBill Details Patient Name: Radman, Altheia C. Date of Service: 03/06/2015 Medical Record Number: [id number] Patient Account Number: [id number] Date of Birth/Sex: 14-Feb-1928 (81 y.o. Female) Treating RN: Cornell Barman Primary Care Physician: Ria Bush Other Clinician: Referring Physician: Ria Bush Treating Physician/Extender: Frann Rider in Treatment: 29 Diagnosis Coding ICD-10 Codes Code Description E11.622 Type 2 diabetes mellitus with other skin ulcer E11.610 Type 2 diabetes mellitus with diabetic neuropathic arthropathy Chronic venous hypertension  (idiopathic) with ulcer and inflammation of right lower I87.331 extremity S41.112A Laceration without foreign body of left upper arm, initial encounter Facility Procedures CPT4: Description Modifier Quantity Code [id number] - DEB SUBQ TISSUE 20 SQ CM/< 1 ICD-10 Description Diagnosis E11.622 Type 2 diabetes mellitus with other skin ulcer E11.610 Type 2 diabetes mellitus with diabetic neuropathic arthropathy I87.331  Chronic venous hypertension (idiopathic) with ulcer and inflammation of right lower extremity S41.112A Laceration without foreign body of left upper arm, initial encounter Physician Procedures CPT4: Description Modifier Quantity Code [id number] - WC PHYS SUBQ TISS 20 SQ CM 1 ICD-10 Description Diagnosis E11.622 Type 2 diabetes mellitus with other skin ulcer E11.610 Type 2 diabetes mellitus with diabetic neuropathic arthropathy I87.331  Chronic venous hypertension (idiopathic) with ulcer and inflammation of right lower extremity S41.112A Laceration without foreign body of left upper arm, initial encounter Electronic Signature(s) Signed: 03/06/2015 4:08:01 PM By: Christin Fudge MD, FACS  ESTAFANI, EYERLY ([id number]) Previous Signature: 03/06/2015 4:07:42 PM Version By: Christin Fudge MD, FACS Entered By: Christin Fudge on 03/06/2015 16:08:01

## 2015-03-11 NOTE — Progress Notes (Signed)
CLAREEN, OBOYLE ([id number]) Visit Report for 03/06/2015 Arrival Information Details Patient Name: [name], [name] Date of Service: 03/06/2015 3:00 PM Medical Record Number: [id number] Patient Account Number: [id number] Date of Birth/Sex: [birth year] (81 y.o. Female) Treating RN: Carolyne Fiscal, Debi Primary Care Physician: Ria Bush Other Clinician: Referring Physician: Ria Bush Treating Physician/Extender: Frann Rider in Treatment: 29 Visit Information History Since Last Visit All ordered tests and consults were completed: No Patient Arrived: Walker Added or deleted any medications: No Arrival Time: 15:16 Any new allergies or adverse reactions: No Accompanied By: self Had a fall or experienced change in No Transfer Assistance: None activities of daily living that may affect Patient Identification Verified: Yes risk of falls: Secondary Verification Process Yes Signs or symptoms of abuse/neglect since last No Completed: visito Patient Requires Transmission- No Hospitalized since last visit: No Based Precautions: Pain Present Now: No Patient Has Alerts: Yes Patient Alerts: Patient on Blood Thinner Type II Diabetic 81 MG aspirin Electronic Signature(s) Signed: 03/10/2015 6:00:56 PM By: Alric Quan Entered By: Alric Quan on 03/06/2015 15:19:21 [name] ([id number]) -------------------------------------------------------------------------------- Encounter Discharge Information Details Patient Name: [name], [name] Date of Service: 03/06/2015 3:00 PM Medical Record Number: [id number] Patient Account Number: [id number] Date of Birth/Sex: 1927-05-02 (81 y.o. Female) Treating RN: Carolyne Fiscal, Debi Primary Care Physician: Ria Bush Other Clinician: Referring Physician: Ria Bush Treating Physician/Extender: Frann Rider in Treatment: 29 Encounter Discharge Information Items Discharge Pain Level: 4 Discharge  Condition: Stable Ambulatory Status: Walker Discharge Destination: Home Transportation: Private Auto Accompanied By: self Schedule Follow-up Appointment: Yes Medication Reconciliation completed Yes and provided to Patient/Care Zaide Kardell: Provided on Clinical Summary of Care: 03/06/2015 Form Type Recipient Paper Patient LL Electronic Signature(s) Signed: 03/06/2015 4:20:01 PM By: Ruthine Dose Entered By: Ruthine Dose on 03/06/2015 16:20:01 [name] ([id number]) -------------------------------------------------------------------------------- Lower Extremity Assessment Details Patient Name: [name], [name] Date of Service: 03/06/2015 3:00 PM Medical Record Number: [id number] Patient Account Number: [id number] Date of Birth/Sex: 21-Dec-1927 (81 y.o. Female) Treating RN: Carolyne Fiscal, Debi Primary Care Physician: Ria Bush Other Clinician: Referring Physician: Ria Bush Treating Physician/Extender: Frann Rider in Treatment: 29 Edema Assessment Assessed: [Left: No] [Right: No] Edema: [Left: N] [Right: o] Calf Left: Right: Point of Measurement: cm From Medial Instep cm 27.6 cm Ankle Left: Right: Point of Measurement: cm From Medial Instep cm 20 cm Vascular Assessment Pulses: Popliteal Palpable: [Right:No] Doppler: [Right:Monophasic] Posterior Tibial Palpable: [Right:No] Doppler: [Right:Monophasic] Dorsalis Pedis Palpable: [Right:No] Doppler: [Right:Monophasic] Extremity colors, hair growth, and conditions: Extremity Color: [Right:Mottled] Hair Growth on Extremity: [Right:No] Temperature of Extremity: [Right:Warm] Capillary Refill: [Right:< 3 seconds] Toe Nail Assessment Left: Right: Thick: Yes Discolored: No Deformed: No Improper Length and Hygiene: No [name] Kitchen ([id number]) Electronic Signature(s) Signed: 03/10/2015 6:00:56 PM By: Alric Quan Entered By: Alric Quan on 03/06/2015 15:31:39 [name]  ([id number]) -------------------------------------------------------------------------------- Multi Wound Chart Details Patient Name: [name], [name] Date of Service: 03/06/2015 3:00 PM Medical Record Number: [id number] Patient Account Number: [id number] Date of Birth/Sex: 01-11-1928 (81 y.o. Female) Treating RN: Carolyne Fiscal, Debi Primary Care Physician: Ria Bush Other Clinician: Referring Physician: Ria Bush Treating Physician/Extender: Frann Rider in Treatment: 29 Vital Signs Height(in): 58 Pulse(bpm): 72 Weight(lbs): 151 Blood Pressure 152/66 (mmHg): Body Mass Index(BMI): 32 Temperature(F): 98.0 Respiratory Rate 18 (breaths/min): Photos: [1:No Photos] [N/A:N/A] Wound Location: [1:Right Lower Leg - Medial] [N/A:N/A] Wounding Event: [1:Gradually Appeared] [N/A:N/A] Primary Etiology: [1:Venous Leg Ulcer] [N/A:N/A] Comorbid History: [1:Cataracts, Anemia, Coronary Artery Disease, Hypertension, Myocardial Infarction, Type II Diabetes, Osteoarthritis] [N/A:N/A]  Date Acquired: [1:04/04/2014] [N/A:N/A] Weeks of Treatment: [1:29] [N/A:N/A] Wound Status: [1:Open] [N/A:N/A] Measurements L x W x D 5.4x1.2x0.3 [N/A:N/A] (cm) Area (cm) : [1:5.089] [N/A:N/A] Volume (cm) : [1:1.527] [N/A:N/A] % Reduction in Area: [1:13.60%] [N/A:N/A] % Reduction in Volume: 13.60% [N/A:N/A] Classification: [1:Full Thickness Without Exposed Support Structures] [N/A:N/A] HBO Classification: [1:Grade 1] [N/A:N/A] Exudate Amount: [1:Medium] [N/A:N/A] Exudate Type: [1:Serosanguineous] [N/A:N/A] Exudate Color: [1:red, brown] [N/A:N/A] Foul Odor After [1:Yes] [N/A:N/A] Cleansing: Odor Anticipated Due to No [N/A:N/A] Product Use: Wound Margin: [1:Distinct, outline attached] [N/A:N/A] Granulation Amount: None Present (0%) N/A N/A Necrotic Amount: Large (67-100%) N/A N/A Exposed Structures: Fascia: No N/A N/A Fat: No Tendon: No Muscle: No Joint: No Bone: No Limited to  Skin Breakdown Epithelialization: Small (1-33%) N/A N/A Periwound Skin Texture: Edema: Yes N/A N/A Excoriation: No Induration: No Callus: No Crepitus: No Fluctuance: No Friable: No Rash: No Scarring: No Periwound Skin Moist: Yes N/A N/A Moisture: Maceration: No Dry/Scaly: No Periwound Skin Color: Erythema: Yes N/A N/A Mottled: Yes Atrophie Blanche: No Cyanosis: No Ecchymosis: No Hemosiderin Staining: No Pallor: No Rubor: No Erythema Location: Circumferential N/A N/A Temperature: No Abnormality N/A N/A Tenderness on Yes N/A N/A Palpation: Wound Preparation: Ulcer Cleansing: Other: N/A N/A soap and water Topical Anesthetic Applied: Other: lidocaine 4% Treatment Notes Electronic Signature(s) Signed: 03/10/2015 6:00:56 PM By: Alric Quan Entered By: Alric Quan on 03/06/2015 15:34:29 [name] -------------------------------------------------------------------------------- [place] Details Patient Name: Meckley, Shaterica C. Date of Service: 03/06/2015 3:00 PM Medical Record Number: [id number] Patient Account Number: [id number] Date of Birth/Sex: March 29, 1928 (81 y.o. Female) Treating RN: Carolyne Fiscal, Debi Primary Care Physician: Ria Bush Other Clinician: Referring Physician: Ria Bush Treating Physician/Extender: Frann Rider in Treatment: 29 Active Inactive Abuse / Safety / Falls / Self Care Management Nursing Diagnoses: Potential for falls Goals: Patient will remain injury free Date Initiated: 08/15/2014 Goal Status: Active Interventions: Assess fall risk on admission and as needed Notes: Nutrition Nursing Diagnoses: Impaired glucose control: actual or potential Goals: Patient/caregiver verbalizes understanding of need to maintain therapeutic glucose control per primary care physician Date Initiated: 08/15/2014 Goal Status: Active Interventions: Assess patient nutrition upon admission  and as needed per policy Notes: Orientation to the Wound Care Program Nursing Diagnoses: Knowledge deficit related to the wound healing center program Goals: Patient/caregiver will verbalize understanding of the Morrow, Maci C. ([id number]) Date Initiated: 08/15/2014 Goal Status: Active Interventions: Provide education on orientation to the wound center Notes: Venous Leg Ulcer Nursing Diagnoses: Actual venous Insuffiency (use after diagnosis is confirmed) Goals: Patient will maintain optimal edema control Date Initiated: 08/15/2014 Goal Status: Active Interventions: Compression as ordered Treatment Activities: Test ordered outside of clinic : 03/06/2015 Notes: Wound/Skin Impairment Nursing Diagnoses: Impaired tissue integrity Goals: Patient/caregiver will verbalize understanding of skin care regimen Date Initiated: 08/15/2014 Goal Status: Active Interventions: Assess patient/caregiver ability to obtain necessary supplies Notes: Electronic Signature(s) Signed: 03/10/2015 6:00:56 PM By: Alric Quan Entered By: Alric Quan on 03/06/2015 15:34:22 Craddock, Serenah C. ([id number]) -------------------------------------------------------------------------------- Pain Assessment Details Patient Name: Eckenrode, Azayla C. Date of Service: 03/06/2015 3:00 PM Medical Record Number: [id number] Patient Account Number: [id number] Date of Birth/Sex: 1927-08-14 (81 y.o. Female) Treating RN: Carolyne Fiscal, Debi Primary Care Physician: Ria Bush Other Clinician: Referring Physician: Ria Bush Treating Physician/Extender: Frann Rider in Treatment: 29 Active Problems Location of Pain Severity and Description of Pain Patient Has [name] No Site Locations Pain Management and Medication Current Pain Management: Electronic Signature(s) Signed: 03/10/2015 6:00:56 PM By: Alric Quan Entered  By: Alric Quan on 03/06/2015  15:19:28 [name] ([id number]) -------------------------------------------------------------------------------- Patient/Caregiver Education Details Patient Name: Marcelli, Audreena C. Date of Service: 03/06/2015 3:00 PM Medical Record Number: [id number] Patient Account Number: [id number] Date of Birth/Gender: [birth year] (81 y.o. Female) Treating RN: Carolyne Fiscal, Debi Primary Care Physician: Ria Bush Other Clinician: Referring Physician: Ria Bush Treating Physician/Extender: Frann Rider in Treatment: 77 Education Assessment Education Provided To: Patient Education Topics Provided Wound/Skin Impairment: Handouts: Other: do not get wrap wet Methods: Demonstration, Explain/Verbal Responses: State content correctly Electronic Signature(s) Signed: 03/10/2015 6:00:56 PM By: Alric Quan Entered By: Alric Quan on 03/06/2015 16:07:35 [name] ([id number]) -------------------------------------------------------------------------------- Wound Assessment Details Patient Name: Sortor, Rosali C. Date of Service: 03/06/2015 3:00 PM Medical Record Number: [id number] Patient Account Number: [id number] Date of Birth/Sex: 04/26/1927 (81 y.o. Female) Treating RN: Carolyne Fiscal, Debi Primary Care Physician: Ria Bush Other Clinician: Referring Physician: Ria Bush Treating Physician/Extender: Frann Rider in Treatment: 29 Wound Status Wound Number: 1 Primary Venous Leg Ulcer Etiology: Wound Location: Right Lower Leg - Medial Wound Open Wounding Event: Gradually Appeared Status: Date Acquired: 04/04/2014 Comorbid Cataracts, Anemia, Coronary Artery Weeks Of Treatment: 29 History: Disease, Hypertension, Myocardial Clustered Wound: No Infarction, Type II Diabetes, Osteoarthritis Photos Photo Uploaded By: Alric Quan on 03/06/2015 17:08:17 Wound Measurements Length: (cm) 5.4 Width: (cm) 1.2 Depth: (cm) 0.3 Area: (cm)  5.089 Volume: (cm) 1.527 % Reduction in Area: 13.6% % Reduction in Volume: 13.6% Epithelialization: Small (1-33%) Tunneling: No Undermining: No Wound Description Full Thickness Without Exposed Foul Odor A Classification: Support Structures Due to Prod Diabetic Severity Grade 1 (Wagner): Wound Margin: Distinct, outline attached Exudate Amount: Medium Exudate Type: Serosanguineous Exudate Color: red, brown fter Cleansing: Yes uct Use: No Wound Bed Hietpas, Lisia C. ([id number]) Granulation Amount: None Present (0%) Exposed Structure Necrotic Amount: Large (67-100%) Fascia Exposed: No Necrotic Quality: Adherent Slough Fat Layer Exposed: No Tendon Exposed: No Muscle Exposed: No Joint Exposed: No Bone Exposed: No Limited to Skin Breakdown Periwound Skin Texture Texture Color No Abnormalities Noted: No No Abnormalities Noted: No Callus: No Atrophie Blanche: No Crepitus: No Cyanosis: No Excoriation: No Ecchymosis: No Fluctuance: No Erythema: Yes Friable: No Erythema Location: Circumferential Induration: No Hemosiderin Staining: No Localized Edema: Yes Mottled: Yes Rash: No Pallor: No Scarring: No Rubor: No Moisture Temperature / Pain No Abnormalities Noted: No Temperature: No Abnormality Dry / Scaly: No Tenderness on Palpation: Yes Maceration: No Moist: Yes Wound Preparation Ulcer Cleansing: Other: soap and water, Topical Anesthetic Applied: Other: lidocaine 4%, Treatment Notes Wound #1 (Right, Medial Lower Leg) 1. Cleansed with: Cleanse wound with antibacterial soap and water 2. Anesthetic Topical Lidocaine 4% cream to wound bed prior to debridement 4. Dressing Applied: Other dressing (specify in notes) 5. Secondary Dressing Applied ABD Pad 7. Secured with 2 Layer Compression System - Right Lower Extremity Notes sorbact with hydrogel and mepitel lite to heel Glaude, Joella C. ([id number]) Electronic Signature(s) Signed: 03/10/2015 6:00:56  PM By: Alric Quan Entered By: Alric Quan on 03/06/2015 15:34:11 [name] ([id number]) -------------------------------------------------------------------------------- Vitals Details Patient Name: Kutscher, Mychal C. Date of Service: 03/06/2015 3:00 PM Medical Record Number: [id number] Patient Account Number: [id number] Date of Birth/Sex: [birth year] (81 y.o. Female) Treating RN: Carolyne Fiscal, Debi Primary Care Physician: Ria Bush Other Clinician: Referring Physician: Ria Bush Treating Physician/Extender: Frann Rider in Treatment: 29 Vital Signs Time Taken: 15:19 Temperature (F): 98.0 Height (in): 58 Pulse (bpm): 72 Weight (lbs): 151 Respiratory Rate (breaths/min): 18 Body Mass Index (BMI): 31.6 Blood Pressure (mmHg): 152/66  Reference Range: 80 - 120 mg / dl Electronic Signature(s) Signed: 03/10/2015 6:00:56 PM By: Alric Quan Entered By: Alric Quan on 03/06/2015 15:23:28

## 2015-03-13 ENCOUNTER — Encounter: Payer: Medicare Other | Admitting: Surgery

## 2015-03-13 DIAGNOSIS — E11610 Type 2 diabetes mellitus with diabetic neuropathic arthropathy: Secondary | ICD-10-CM | POA: Diagnosis not present

## 2015-03-13 DIAGNOSIS — I87331 Chronic venous hypertension (idiopathic) with ulcer and inflammation of right lower extremity: Secondary | ICD-10-CM | POA: Diagnosis not present

## 2015-03-13 DIAGNOSIS — L97211 Non-pressure chronic ulcer of right calf limited to breakdown of skin: Secondary | ICD-10-CM | POA: Diagnosis not present

## 2015-03-13 DIAGNOSIS — I10 Essential (primary) hypertension: Secondary | ICD-10-CM | POA: Diagnosis not present

## 2015-03-13 DIAGNOSIS — E11622 Type 2 diabetes mellitus with other skin ulcer: Secondary | ICD-10-CM | POA: Diagnosis not present

## 2015-03-13 DIAGNOSIS — S41112A Laceration without foreign body of left upper arm, initial encounter: Secondary | ICD-10-CM | POA: Diagnosis not present

## 2015-03-14 NOTE — Progress Notes (Signed)
SHELBYLYN, SHIMMIN ([id number]) Visit Report for 03/13/2015 Arrival Information Details Patient Name: [name], [name] Date of Service: 03/13/2015 2:00 PM Medical Record Number: [id number] Patient Account Number: [id number] Date of Birth/Sex: 1927/09/28 (81 y.o. Female) Treating RN: Cornell Barman Primary Care Physician: Ria Bush Other Clinician: Referring Physician: Ria Bush Treating Physician/Extender: Frann Rider in Treatment: 40 Visit Information History Since Last Visit Added or deleted any medications: No Patient Arrived: [name] Any new allergies or adverse reactions: No Arrival Time: 14:20 Had a fall or experienced change in No Accompanied By: self activities of daily living that may affect Transfer Assistance: None risk of falls: Patient Identification Verified: Yes Signs or symptoms of abuse/neglect since last No Secondary Verification Process Yes visito Completed: Has Dressing in Place as Prescribed: Yes Patient Requires Transmission- No Has Compression in Place as Prescribed: Yes Based Precautions: Pain Present Now: No Patient Has Alerts: Yes Patient Alerts: Patient on Blood Thinner Type II Diabetic 81 MG aspirin Electronic Signature(s) Signed: 03/13/2015 3:49:12 PM By: [name] Cool, RN, BSN, Kim RN, BSN Entered By: [name] Cool, RN, BSN, Kim on 03/13/2015 14:20:46 [name] -------------------------------------------------------------------------------- Complex / Palliative Patient Assessment Details Patient Name: [name], [name] Date of Service: 03/13/2015 2:00 PM Medical Record Number: [id number] Patient Account Number: [id number] Date of Birth/Sex: [birth year] (81 y.o. Female) Treating RN: Cornell Barman Primary Care Physician: Ria Bush Other Clinician: Referring Physician: Ria Bush Treating Physician/Extender: Frann Rider in Treatment: 30 Palliative Management Criteria Complex Wound Management  Criteria Evaluation by a vascular surgeon has determined that the patient is not a revascularization candidate due to: have done all they can do. Care Approach Wound Care Plan: Complex Wound Management Electronic Signature(s) Signed: 03/13/2015 3:49:12 PM By: [name] Cool RN, BSN, Kim RN, BSN Signed: 03/13/2015 4:13:31 PM By: Christin Fudge MD, FACS Entered By: [name] Cool RN, BSN, Kim on 03/13/2015 14:37:27 [name] -------------------------------------------------------------------------------- Encounter Discharge Information Details Patient Name: [name], [name] Date of Service: 03/13/2015 2:00 PM Medical Record Number: [id number] Patient Account Number: [id number] Date of Birth/Sex: [birth year] (81 y.o. Female) Treating RN: Cornell Barman Primary Care Physician: Ria Bush Other Clinician: Referring Physician: Ria Bush Treating Physician/Extender: Frann Rider in Treatment: 44 Encounter Discharge Information Items Discharge Pain Level: 0 Discharge Condition: Stable Ambulatory Status: Ambulatory Discharge Destination: Home Transportation: Private Auto Accompanied By: self Schedule Follow-up Appointment: Yes Medication Reconciliation completed and provided to Patient/Care Yes [name]: Provided on Clinical Summary of Care: 03/13/2015 Form Type Recipient Paper Patient LL Electronic Signature(s) Signed: 03/13/2015 2:50:43 PM By: Ruthine Dose Entered By: Ruthine Dose on 03/13/2015 14:50:43 [name] -------------------------------------------------------------------------------- Lower Extremity Assessment Details Patient Name: [name], [name] Date of Service: 03/13/2015 2:00 PM Medical Record Number: [id number] Patient Account Number: [id number] Date of Birth/Sex: 1927-08-17 (81 y.o. Female) Treating RN: Cornell Barman Primary Care Physician: Ria Bush Other Clinician: Referring Physician: Ria Bush Treating  Physician/Extender: Frann Rider in Treatment: 30 Vascular Assessment Pulses: Posterior Tibial Dorsalis Pedis Palpable: [Right:Yes] Extremity colors, hair growth, and conditions: Extremity Color: [Right:Mottled] Hair Growth on Extremity: [Right:No] Temperature of Extremity: [Right:Warm] Capillary Refill: [Right:< 3 seconds] Toe Nail Assessment Left: Right: Thick: No Discolored: No Deformed: No Improper Length and Hygiene: No Electronic Signature(s) Signed: 03/13/2015 3:49:12 PM By: [name] Cool, RN, BSN, Kim RN, BSN Entered By: [name] Cool, RN, BSN, Kim on 03/13/2015 14:27:40 [name] -------------------------------------------------------------------------------- Multi Wound Chart Details Patient Name: [name], [name] Date of Service: 03/13/2015 2:00 PM Medical Record Number: [id number] Patient Account Number: [id number] Date  of Birth/Sex: [birth year] (81 y.o. Female) Treating RN: Cornell Barman Primary Care Physician: Ria Bush Other Clinician: Referring Physician: Ria Bush Treating Physician/Extender: Frann Rider in Treatment: 30 Vital Signs Height(in): 58 Pulse(bpm): 78 Weight(lbs): 151 Blood Pressure 137/55 (mmHg): Body Mass Index(BMI): 32 Temperature(F): 98.2 Respiratory Rate 18 (breaths/min): Photos: [1:No Photos] [N/A:N/A] Wound Location: [1:Right Lower Leg - Medial] [N/A:N/A] Wounding Event: [1:Gradually Appeared] [N/A:N/A] Primary Etiology: [1:Venous Leg Ulcer] [N/A:N/A] Comorbid History: [1:Cataracts, Anemia, Coronary Artery Disease, Hypertension, Myocardial Infarction, Type II Diabetes, Osteoarthritis] [N/A:N/A] Date Acquired: [1:04/04/2014] [N/A:N/A] Weeks of Treatment: [1:30] [N/A:N/A] Wound Status: [1:Open] [N/A:N/A] Measurements L x W x D 4.5x1x0.2 [N/A:N/A] (cm) Area (cm) : [1:3.534] [N/A:N/A] Volume (cm) : [1:0.707] [N/A:N/A] % Reduction in Area: [1:40.00%] [N/A:N/A] % Reduction in Volume: 60.00%  [N/A:N/A] Classification: [1:Full Thickness Without Exposed Support Structures] [N/A:N/A] HBO Classification: [1:Grade 1] [N/A:N/A] Exudate Amount: [1:Medium] [N/A:N/A] Exudate Type: [1:Serosanguineous] [N/A:N/A] Exudate Color: [1:red, brown] [N/A:N/A] Foul Odor After [1:Yes] [N/A:N/A] Cleansing: Odor Anticipated Due to No [N/A:N/A] Product Use: Wound Margin: [1:Distinct, outline attached] [N/A:N/A] Granulation Amount: None Present (0%) N/A N/A Necrotic Amount: Large (67-100%) N/A N/A Exposed Structures: Fascia: No N/A N/A Fat: No Tendon: No Muscle: No Joint: No Bone: No Limited to Skin Breakdown Epithelialization: Small (1-33%) N/A N/A Periwound Skin Texture: Edema: Yes N/A N/A Excoriation: No Induration: No Callus: No Crepitus: No Fluctuance: No Friable: No Rash: No Scarring: No Periwound Skin Moist: Yes N/A N/A Moisture: Maceration: No Dry/Scaly: No Periwound Skin Color: Erythema: Yes N/A N/A Mottled: Yes Atrophie Blanche: No Cyanosis: No Ecchymosis: No Hemosiderin Staining: No Pallor: No Rubor: No Erythema Location: Circumferential N/A N/A Temperature: No Abnormality N/A N/A Tenderness on Yes N/A N/A Palpation: Wound Preparation: Ulcer Cleansing: Other: N/A N/A soap and water Topical Anesthetic Applied: Other: lidocaine 4% Treatment Notes Electronic Signature(s) Signed: 03/13/2015 3:49:12 PM By: [name] Cool, RN, BSN, Kim RN, BSN Entered By: [name] Cool, RN, BSN, Kim on 03/13/2015 14:36:31 [place], [name] -------------------------------------------------------------------------------- [place] Details Patient Name: [name], [name] Date of Service: 03/13/2015 2:00 PM Medical Record Number: [id number] Patient Account Number: [id number] Date of Birth/Sex: 1927/11/05 (81 y.o. Female) Treating RN: Cornell Barman Primary Care Physician: Ria Bush Other Clinician: Referring Physician: Ria Bush Treating  Physician/Extender: Frann Rider in Treatment: 38 Active Inactive Abuse / Safety / Falls / Self Care Management Nursing Diagnoses: Potential for falls Goals: Patient will remain injury free Date Initiated: 08/15/2014 Goal Status: Active Interventions: Assess fall risk on admission and as needed Notes: Nutrition Nursing Diagnoses: Impaired glucose control: actual or potential Goals: Patient/caregiver verbalizes understanding of need to maintain therapeutic glucose control per primary care physician Date Initiated: 08/15/2014 Goal Status: Active Interventions: Assess patient nutrition upon admission and as needed per policy Notes: Orientation to the Wound Care Program Nursing Diagnoses: Knowledge deficit related to the wound healing center program Goals: Patient/caregiver will verbalize understanding of the [place], Trenton C. ([id number]) Date Initiated: 08/15/2014 Goal Status: Active Interventions: Provide education on orientation to the wound center Notes: Venous Leg Ulcer Nursing Diagnoses: Actual venous Insuffiency (use after diagnosis is confirmed) Goals: Patient will maintain optimal edema control Date Initiated: 08/15/2014 Goal Status: Active Interventions: Compression as ordered Treatment Activities: Test ordered outside of clinic : 03/13/2015 Notes: Wound/Skin Impairment Nursing Diagnoses: Impaired tissue integrity Goals: Patient/caregiver will verbalize understanding of skin care regimen Date Initiated: 08/15/2014 Goal Status: Active Interventions: Assess patient/caregiver ability to obtain necessary supplies Notes: Electronic Signature(s) Signed: 03/13/2015 3:49:12 PM By: [name] Cool, RN, BSN, Kim  RN, BSN Entered By: [name] Cool, RN, BSN, Kim on 03/13/2015 14:36:24 Slusher, [name] ([id number]) -------------------------------------------------------------------------------- Pain Assessment Details Patient Name: [name], [name]  C. Date of Service: 03/13/2015 2:00 PM Medical Record Number: [id number] Patient Account Number: [id number] Date of Birth/Sex: 05/19/1927 (81 y.o. Female) Treating RN: Cornell Barman Primary Care Physician: Ria Bush Other Clinician: Referring Physician: Ria Bush Treating Physician/Extender: Frann Rider in Treatment: 30 Active Problems Location of Pain Severity and Description of Pain Patient Has [name] No Site Locations Pain Management and Medication Current Pain Management: Electronic Signature(s) Signed: 03/13/2015 3:49:12 PM By: [name] Cool, RN, BSN, Kim RN, BSN Entered By: [name] Cool, RN, BSN, Kim on 03/13/2015 14:20:58 [place], [name] ([id number]) -------------------------------------------------------------------------------- Patient/Caregiver Education Details Patient Name: [name], [name] Date of Service: 03/13/2015 2:00 PM Medical Record Number: [id number] Patient Account Number: [id number] Date of Birth/Gender: 1927-04-06 (81 y.o. Female) Treating RN: Cornell Barman Primary Care Physician: Ria Bush Other Clinician: Referring Physician: Ria Bush Treating Physician/Extender: Frann Rider in Treatment: 30 Education Assessment Education Provided To: Patient Education Topics Provided Wound/Skin Impairment: Handouts: Caring for Your Ulcer, Other: continue wound care as prescribed Methods: Demonstration, Explain/Verbal Responses: State content correctly Electronic Signature(s) Signed: 03/13/2015 3:49:12 PM By: [name] Cool, RN, BSN, Kim RN, BSN Entered By: [name] Cool, RN, BSN, Kim on 03/13/2015 14:48:48 Bose, [name] ([id number]) -------------------------------------------------------------------------------- Wound Assessment Details Patient Name: [name], [name] Date of Service: 03/13/2015 2:00 PM Medical Record Number: [id number] Patient Account Number: [id number] Date of Birth/Sex: 01-02-1928 (81 y.o. Female) Treating RN: Cornell Barman Primary  Care Physician: Ria Bush Other Clinician: Referring Physician: Ria Bush Treating Physician/Extender: Frann Rider in Treatment: 30 Wound Status Wound Number: 1 Primary Venous Leg Ulcer Etiology: Wound Location: Right Lower Leg - Medial Wound Open Wounding Event: Gradually Appeared Status: Date Acquired: 04/04/2014 Comorbid Cataracts, Anemia, Coronary Artery Weeks Of Treatment: 30 History: Disease, Hypertension, Myocardial Clustered Wound: No Infarction, Type II Diabetes, Osteoarthritis Photos Photo Uploaded By: [name] Cool, RN, BSN, Kim on 03/13/2015 17:06:44 Wound Measurements Length: (cm) 4.5 Width: (cm) 1 Depth: (cm) 0.2 Area: (cm) 3.534 Volume: (cm) 0.707 % Reduction in Area: 40% % Reduction in Volume: 60% Epithelialization: Small (1-33%) Tunneling: No Undermining: No Wound Description Full Thickness Without Exposed Foul Odor A Classification: Support Structures Due to Prod Diabetic Severity Grade 1 (Wagner): Wound Margin: Distinct, outline attached Exudate Amount: Medium Exudate Type: Serosanguineous Exudate Color: red, brown fter Cleansing: Yes uct Use: No Wound Bed [name], [name] ([id number]) Granulation Amount: None Present (0%) Exposed Structure Necrotic Amount: Large (67-100%) Fascia Exposed: No Necrotic Quality: Adherent Slough Fat Layer Exposed: No Tendon Exposed: No Muscle Exposed: No Joint Exposed: No Bone Exposed: No Limited to Skin Breakdown Periwound Skin Texture Texture Color No Abnormalities Noted: No No Abnormalities Noted: No Callus: No Atrophie Blanche: No Crepitus: No Cyanosis: No Excoriation: No Ecchymosis: No Fluctuance: No Erythema: Yes Friable: No Erythema Location: Circumferential Induration: No Hemosiderin Staining: No Localized Edema: Yes Mottled: Yes Rash: No Pallor: No Scarring: No Rubor: No Moisture Temperature / Pain No Abnormalities Noted: No Temperature: No Abnormality Dry /  Scaly: No Tenderness on Palpation: Yes Maceration: No Moist: Yes Wound Preparation Ulcer Cleansing: Other: soap and water, Topical Anesthetic Applied: Other: lidocaine 4%, Treatment Notes Wound #1 (Right, Medial Lower Leg) 1. Cleansed with: Cleanse wound with antibacterial soap and water 2. Anesthetic Topical Lidocaine 4% cream to wound bed prior to debridement 4. Dressing Applied: Other dressing (specify in notes) 5. Secondary Dressing Applied ABD Pad 7. Secured with 2 Layer Lite Compression  System - Right Lower Extremity Notes sorbact with hydrogel and mepitel lite to heel MARJORY, KLIMAS ([id number]) Electronic Signature(s) Signed: 03/13/2015 3:49:12 PM By: [name] Cool, RN, BSN, Kim RN, BSN Entered By: [name] Cool, RN, BSN, Kim on 03/13/2015 14:29:22 [place], [name] -------------------------------------------------------------------------------- [place] Details Patient Name: [name], [name] Date of Service: 03/13/2015 2:00 PM Medical Record Number: [id number] Patient Account Number: [id number] Date of Birth/Sex: November 11, 1927 (81 y.o. Female) Treating RN: Cornell Barman Primary Care Physician: Ria Bush Other Clinician: Referring Physician: Ria Bush Treating Physician/Extender: Frann Rider in Treatment: 30 Vital Signs Time Taken: 14:21 Temperature (F): 98.2 Height (in): 58 Pulse (bpm): 78 Weight (lbs): 151 Respiratory Rate (breaths/min): 18 Body Mass Index (BMI): 31.6 Blood Pressure (mmHg): 137/55 Reference Range: 80 - 120 mg / dl Electronic Signature(s) Signed: 03/13/2015 3:49:12 PM By: [name] Cool, RN, BSN, Kim RN, BSN Entered By: [name] Cool, RN, BSN, Kim on 03/13/2015 14:21:16

## 2015-03-14 NOTE — Progress Notes (Signed)
[name], [name] ([id number]) Visit Report for 03/13/2015 Chief Complaint Document Details Patient Name: [name], [name] Date of Service: 03/13/2015 2:00 PM Medical Record Number: [id number] Patient Account Number: [id number] Date of Birth/Sex: [birth year] (81 y.o. Female) Treating RN: Cornell Barman Primary Care Physician: Ria Bush Other Clinician: Referring Physician: Ria Bush Treating Physician/Extender: Frann Rider in Treatment: 27 Information Obtained from: Patient Chief Complaint Patient presents for treatment of an open ulcer due to venous insufficiency. 81 year old patient who is known to me from the [facility] wound care center now comes in follow up here for a chronic venous ulceration of her right lower extremity which she's had for about 6 months. Electronic Signature(s) Signed: 03/13/2015 2:40:36 PM By: Christin Fudge MD, FACS Entered By: Christin Fudge on 03/13/2015 14:40:36 [name], [name] ([id number]) -------------------------------------------------------------------------------- HPI Details Patient Name: [name], [name] Date of Service: 03/13/2015 2:00 PM Medical Record Number: [id number] Patient Account Number: [id number] Date of Birth/Sex: 1927-11-29 (81 y.o. Female) Treating RN: Cornell Barman Primary Care Physician: Ria Bush Other Clinician: Referring Physician: Ria Bush Treating Physician/Extender: Frann Rider in Treatment: 30 History of Present Illness Location: ulcer in the right lower extremity for several months Quality: Patient reports experiencing a dull pain to affected area(s). Severity: Patient states wound (s) are getting better. Duration: Patient has had the wound for > 3 months prior to seeking treatment at the wound center Timing: Pain in wound is Intermittent (comes and goes Context: The wound appeared gradually over time Modifying Factors: Consults to this date include: vascular surgery for the  veins in December 2015 and she's been seeing the wound care center at [facility] for several months. Associated Signs and Symptoms: Patient reports having difficulty standing for long periods. HPI Description: This is an 81 yrs old female we have been following since the beginning of January for a wound on the right medial ankle. She had previously had venous ablation of the greater saphenous vein by Dr. Kellie Simmering. She came to [place] with erythema of the right leg and subsequently is opened in the medial aspect of the right ankle. She has not been very compliant with compression. We ordered her at juxta lite stocking, however she apparently has not been able to wear it. The area on the medial aspect of her left ankle has much less induration and erythema. The edema is better controlled. No evidence of infection is noted. 07/18/14; no major change in the condition of this wound. There is still surface slough and probably some drainage. 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 All other veins showed no evidence of DVT, superficial venous thrombosis or incompetence. however she does have manifestations of chronic venous hypertension and I believe we will change her dressing over to Prisma and a Profore light dressing. she sees her primary care doctor regularly and her last hemoglobin A1c has been around 7%.her other comorbidities are hypertension, CABG in 1998, peptic ulcer disease, history of echo with a ejection fraction of 55-60% and grade 1 diastolic dysfunction. past surgical history include coronary artery bypass graft in 1998, cholecystectomy, appendectomy, hand surgery, endovenous ablation of the saphenous vein with laser by Dr. Kellie Simmering in December 2015. 08/29/2014 - she complains of increased calf pain over the past week. removed her compression bandage and pain improved. no  fever or chills. Mild clear drainage. 09/04/2014 -- she has not been compliant and  has not done a dressing change as we had ordered last week. She always complains and does not keep her wrap on and this past week she has done the same. She has completed a course of doxycycline. 09/25/2014 -- she has tolerated her dressing all week and has not removed it. She says it's hurting a lot today and would not like any debridement. We will do so as per her wishes. she did get a appointment with the vascular surgeon and sees him on 6/28. [name], [name] ([id number]) 10/03/2014 - She was seen by Dr. Tinnie Gens 09/30/2014. Assessment: I reviewed the previous venous duplex exam study performed in September 2015 which reveals no options for treating this venous ulcer. There is no significant superficial venous reflux to treat with laser ablation.Most recent ABI was 0.91 in the right foot with palpable dorsalis pedis pulse at 3+ Plan: patient needs to return to the [place] wound center and be treated by Dr. Con Memos. No arterial or venous options available from vascular standpoint or treatment of right leg stasis ulcer.Continued dressing changes and compression is the treatment of choice 10/16/2014 -- today will be her first application of Apligraf. 10/23/2014 -- we are going to be able to apply her second day of Apligraf next week. She is complaining of some tenderness in the region of Achilles tendon and this is possibly due to her wrap. 11/06/2014 -- she did not have Apligraf last week because the wound was not looking good but she says she is feeling much better now. 11/13/2014 -- she has been doing well this week and she is here for her second Apligraf. 11/27/2014 -- she is here for her third application of Apligraf 12/12/2014 -- she is here for her fourth application of Apligraf. Also during these past 3 days she had an injury to her left forearm which she caught against the edge of her desk and  has a lacerated wound which has gone down to the subcutaneous tissue. 12/26/2014 -- she is here for a last/fifth application of Apligraf. Electronic Signature(s) Signed: 03/13/2015 2:40:53 PM By: Christin Fudge MD, FACS Entered By: Christin Fudge on 03/13/2015 14:40:53 [name], [name] ([id number]) -------------------------------------------------------------------------------- Physical Exam Details Patient Name: [name], [name] Date of Service: 03/13/2015 2:00 PM Medical Record Number: [id number] Patient Account Number: [id number] Date of Birth/Sex: [birth year] (81 y.o. Female) Treating RN: Cornell Barman Primary Care Physician: Ria Bush Other Clinician: Referring Physician: Ria Bush Treating Physician/Extender: Frann Rider in Treatment: 30 Constitutional . Pulse regular. Respirations normal and unlabored. Afebrile. . Eyes Nonicteric. Reactive to light. Ears, Nose, Mouth, and Throat Lips, teeth, and gums WNL.Marland Kitchen Moist mucosa without lesions . Neck supple and nontender. No palpable supraclavicular or cervical adenopathy. Normal sized without goiter. Respiratory WNL. No retractions.. Cardiovascular Pedal Pulses WNL. there is good control of her edema and there is no other problems with her lower extremity except the ulcer.. Lymphatic No adneopathy. No adenopathy. No adenopathy. Musculoskeletal Adexa without tenderness or enlargement.. Digits and nails w/o clubbing, cyanosis, infection, petechiae, ischemia, or inflammatory conditions.. Integumentary (Hair, Skin) No suspicious lesions. No crepitus or fluctuance. No peri-wound warmth or erythema. No masses.Marland Kitchen Psychiatric Judgement and insight Intact.. No evidence of depression, anxiety, or agitation.. Notes the wound has less debris today and no sharp dissection was required. Electronic Signature(s) Signed: 03/13/2015 2:41:46 PM By: Christin Fudge MD, FACS Entered By: Christin Fudge on 03/13/2015 14:41:46 Nations,  [name] ([id number]) -------------------------------------------------------------------------------- Physician Orders Details Patient Name: [name], [name] Date of Service: 03/13/2015  2:00 PM Medical Record Number: [id number] Patient Account Number: [id number] Date of Birth/Sex: 08-22-1927 (81 y.o. Female) Treating RN: Cornell Barman Primary Care Physician: Ria Bush Other Clinician: Referring Physician: Ria Bush Treating Physician/Extender: Frann Rider in Treatment: 90 Verbal / Phone Orders: Yes Clinician: Cornell Barman Read Back and Verified: Yes Diagnosis Coding Wound Cleansing Wound #1 Right,Medial Lower Leg o Cleanse wound with mild soap and water Anesthetic Wound #1 Right,Medial Lower Leg o Topical Lidocaine 4% cream applied to wound bed prior to debridement Primary Wound Dressing Wound #1 Right,Medial Lower Leg o Other: - sorbact with hydrogel Secondary Dressing Wound #1 Right,Medial Lower Leg o ABD pad Dressing Change Frequency Wound #1 Right,Medial Lower Leg o Change dressing every week Follow-up Appointments Wound #1 Right,Medial Lower Leg o Return Appointment in 1 week. Edema Control Wound #1 Right,Medial Lower Leg o 2 Layer Compression System - Right Lower Extremity - mepitel lite on heel Electronic Signature(s) Signed: 03/13/2015 3:49:12 PM By: Gretta Cool RN, BSN, Kim RN, BSN Signed: 03/13/2015 4:13:31 PM By: Christin Fudge MD, FACS Entered By: Gretta Cool RN, BSN, Kim on 03/13/2015 14:38:21 Diffley, [name] Wallis, [name] Loletha Grayer ([id number]) -------------------------------------------------------------------------------- Problem List Details Patient Name: [name], [name] Date of Service: 03/13/2015 2:00 PM Medical Record Number: [id number] Patient Account Number: [id number] Date of Birth/Sex: 1927/11/20 (81 y.o. Female) Treating RN: Cornell Barman Primary Care Physician: Ria Bush Other Clinician: Referring  Physician: Ria Bush Treating Physician/Extender: Frann Rider in Treatment: 30 Active Problems ICD-10 Encounter Code Description Active Date Diagnosis E11.622 Type 2 diabetes mellitus with other skin ulcer 08/15/2014 Yes E11.610 Type 2 diabetes mellitus with diabetic neuropathic 08/15/2014 Yes arthropathy I87.331 Chronic venous hypertension (idiopathic) with ulcer and 08/15/2014 Yes inflammation of right lower extremity S41.112A Laceration without foreign body of left upper arm, initial 12/12/2014 Yes encounter Inactive Problems Resolved Problems ICD-10 Code Description Active Date Resolved Date L03.115 Cellulitis of right lower limb 08/29/2014 08/29/2014 Electronic Signature(s) Signed: 03/13/2015 2:40:25 PM By: Christin Fudge MD, FACS Entered By: Christin Fudge on 03/13/2015 14:40:25 [name], [name] -------------------------------------------------------------------------------- Progress Note Details Patient Name: [name], [name] Date of Service: 03/13/2015 2:00 PM Medical Record Number: [id number] Patient Account Number: [id number] Date of Birth/Sex: 12-Jun-1927 (81 y.o. Female) Treating RN: Cornell Barman Primary Care Physician: Ria Bush Other Clinician: Referring Physician: Ria Bush Treating Physician/Extender: Frann Rider in Treatment: 30 Subjective Chief Complaint Information obtained from Patient Patient presents for treatment of an open ulcer due to venous insufficiency. 81 year old patient who is known to me from the [facility] wound care center now comes in follow up here for a chronic venous ulceration of her right lower extremity which she's had for about 6 months. History of Present Illness (HPI) The following HPI elements were documented for the patient's wound: Location: ulcer in the right lower extremity for several months Quality: Patient reports experiencing a dull pain to affected area(s). Severity: Patient  states wound (s) are getting better. Duration: Patient has had the wound for > 3 months prior to seeking treatment at the wound center Timing: Pain in wound is Intermittent (comes and goes Context: The wound appeared gradually over time Modifying Factors: Consults to this date include: vascular surgery for the veins in December 2015 and she's been seeing the wound care center at [facility] for several months. Associated Signs and Symptoms: Patient reports having difficulty standing for long periods. This is an 81 yrs old female we have been following since the beginning of January for a wound on the right  medial ankle. She had previously had venous ablation of the greater saphenous vein by Dr. Kellie Simmering. She came to [place] with erythema of the right leg and subsequently is opened in the medial aspect of the right ankle. She has not been very compliant with compression. We ordered her at juxta lite stocking, however she apparently has not been able to wear it. The area on the medial aspect of her left ankle has much less induration and erythema. The edema is better controlled. No evidence of infection is noted. 07/18/14; no major change in the condition of this wound. There is still surface slough and probably some drainage. 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 All other veins showed no evidence of DVT, superficial venous thrombosis or incompetence. however she does have manifestations of chronic venous hypertension and I believe we will change her dressing over to Prisma and a Profore light dressing. she sees her primary care doctor regularly and her last hemoglobin A1c has been around 7%.her other comorbidities are hypertension, CABG in 1998, peptic ulcer disease, history of echo with a ejection fraction of 55-60% and grade 1 diastolic dysfunction. past surgical history  include coronary artery bypass graft in 1998, cholecystectomy, appendectomy, hand surgery, endovenous ablation of the saphenous vein with laser by Dr. Kellie Simmering in December 2015. LANGSTON, KELTER ([id number]) 08/29/2014 - she complains of increased calf pain over the past week. removed her compression bandage and pain improved. no fever or chills. Mild clear drainage. 09/04/2014 -- she has not been compliant and has not done a dressing change as we had ordered last week. She always complains and does not keep her wrap on and this past week she has done the same. She has completed a course of doxycycline. 09/25/2014 -- she has tolerated her dressing all week and has not removed it. She says it's hurting a lot today and would not like any debridement. We will do so as per her wishes. she did get a appointment with the vascular surgeon and sees him on 6/28. 10/03/2014 - She was seen by Dr. Tinnie Gens 09/30/2014. Assessment: I reviewed the previous venous duplex exam study performed in September 2015 which reveals no options for treating this venous ulcer. There is no significant superficial venous reflux to treat with laser ablation.Most recent ABI was 0.91 in the right foot with palpable dorsalis pedis pulse at 3+ Plan: patient needs to return to the [place] wound center and be treated by Dr. Con Memos. No arterial or venous options available from vascular standpoint or treatment of right leg stasis ulcer.Continued dressing changes and compression is the treatment of choice 10/16/2014 -- today will be her first application of Apligraf. 10/23/2014 -- we are going to be able to apply her second day of Apligraf next week. She is complaining of some tenderness in the region of Achilles tendon and this is possibly due to her wrap. 11/06/2014 -- she did not have Apligraf last week because the wound was not looking good but she says she is feeling much better now. 11/13/2014 -- she has been doing well  this week and she is here for her second Apligraf. 11/27/2014 -- she is here for her third application of Apligraf 12/12/2014 -- she is here for her fourth application of Apligraf. Also during these past 3 days she had an injury to her left forearm which she caught  against the edge of her desk and has a lacerated wound which has gone down to the subcutaneous tissue. 12/26/2014 -- she is here for a last/fifth application of Apligraf. Objective Constitutional Pulse regular. Respirations normal and unlabored. Afebrile. Vitals Time Taken: 2:21 PM, Height: 58 in, Weight: 151 lbs, BMI: 31.6, Temperature: 98.2 F, Pulse: 78 bpm, Respiratory Rate: 18 breaths/min, Blood Pressure: 137/55 mmHg. Eyes Nonicteric. Reactive to light. Ears, Nose, Mouth, and Throat [name], [name] ([id number]) Lips, teeth, and gums WNL.Marland Kitchen Moist mucosa without lesions . Neck supple and nontender. No palpable supraclavicular or cervical adenopathy. Normal sized without goiter. Respiratory WNL. No retractions.. Cardiovascular Pedal Pulses WNL. there is good control of her edema and there is no other problems with her lower extremity except the ulcer.. Lymphatic No adneopathy. No adenopathy. No adenopathy. Musculoskeletal Adexa without tenderness or enlargement.. Digits and nails w/o clubbing, cyanosis, infection, petechiae, ischemia, or inflammatory conditions.Marland Kitchen Psychiatric Judgement and insight Intact.. No evidence of depression, anxiety, or agitation.. General Notes: the wound has less debris today and no sharp dissection was required. Integumentary (Hair, Skin) No suspicious lesions. No crepitus or fluctuance. No peri-wound warmth or erythema. No masses.. Wound #1 status is Open. Original cause of wound was Gradually Appeared. The wound is located on the Right,Medial Lower Leg. The wound measures 4.5cm length x 1cm width x 0.2cm depth; 3.534cm^2 area and 0.707cm^3 volume. The wound is limited to skin breakdown.  There is no tunneling or undermining noted. There is a medium amount of serosanguineous drainage noted. The wound margin is distinct with the outline attached to the wound base. There is no granulation within the wound bed. There is a large (67- 100%) amount of necrotic tissue within the wound bed including Adherent Slough. The periwound skin appearance exhibited: Localized Edema, Moist, Mottled, Erythema. The periwound skin appearance did not exhibit: Callus, Crepitus, Excoriation, Fluctuance, Friable, Induration, Rash, Scarring, Dry/Scaly, Maceration, Atrophie Blanche, Cyanosis, Ecchymosis, Hemosiderin Staining, Pallor, Rubor. The surrounding wound skin color is noted with erythema which is circumferential. Periwound temperature was noted as No Abnormality. The periwound has tenderness on palpation. Assessment Active Problems ICD-10 E11.622 - Type 2 diabetes mellitus with other skin ulcer E11.610 - Type 2 diabetes mellitus with diabetic neuropathic arthropathy [name], [name] ([id number]) I87.331 - Chronic venous hypertension (idiopathic) with ulcer and inflammation of right lower extremity S41.112A - Laceration without foreign body of left upper arm, initial encounter Plan Wound Cleansing: Wound #1 Right,Medial Lower Leg: Cleanse wound with mild soap and water Anesthetic: Wound #1 Right,Medial Lower Leg: Topical Lidocaine 4% cream applied to wound bed prior to debridement Primary Wound Dressing: Wound #1 Right,Medial Lower Leg: Other: - sorbact with hydrogel Secondary Dressing: Wound #1 Right,Medial Lower Leg: ABD pad Dressing Change Frequency: Wound #1 Right,Medial Lower Leg: Change dressing every week Follow-up Appointments: Wound #1 Right,Medial Lower Leg: Return Appointment in 1 week. Edema Control: Wound #1 Right,Medial Lower Leg: 2 Layer Compression System - Right Lower Extremity - mepitel lite on heel This week we will use Sorbact with hydrogel and with foam, and  use 2 layer compression which is all she can tolerate. She is again encouraged to continue with exercise, elevation and good control of her diabetes. I have discussed with the patient that I am going to talk to the vendors to try and get her some samples of Puraply and see if this is going to help her. She is agreeable about this. Electronic Signature(s) [name], [name] ([id number]) Signed: 03/13/2015 2:42:59 PM By: Christin Fudge  MD, FACS Entered By: Christin Fudge on 03/13/2015 14:42:59 Ryans, [name] ([id number]) -------------------------------------------------------------------------------- [place] Details Patient Name: [name], [name] Date of Service: 03/13/2015 Medical Record Number: [id number] Patient Account Number: [id number] Date of Birth/Sex: Jun 29, 1927 (81 y.o. Female) Treating RN: Cornell Barman Primary Care Physician: Ria Bush Other Clinician: Referring Physician: Ria Bush Treating Physician/Extender: Frann Rider in Treatment: 30 Diagnosis Coding ICD-10 Codes Code Description E11.622 Type 2 diabetes mellitus with other skin ulcer E11.610 Type 2 diabetes mellitus with diabetic neuropathic arthropathy Chronic venous hypertension (idiopathic) with ulcer and inflammation of right lower I87.331 extremity S41.112A Laceration without foreign body of left upper arm, initial encounter Facility Procedures CPT4: Description Modifier Quantity Code [id number] (Facility Use Only) [phone] - [facility] RT 1 LEG Physician Procedures CPT4: Description Modifier Quantity Code [id number] - WC PHYS LEVEL 3 - EST PT 1 ICD-10 Description Diagnosis E11.622 Type 2 diabetes mellitus with other skin ulcer E11.610 Type 2 diabetes mellitus with diabetic neuropathic arthropathy I87.331 Chronic  venous hypertension (idiopathic) with ulcer and inflammation of right lower extremity S41.112A Laceration without foreign body of left upper arm, initial  encounter Electronic Signature(s) Signed: 03/13/2015 3:49:12 PM By: Gretta Cool, RN, BSN, Kim RN, BSN Signed: 03/13/2015 4:13:31 PM By: Christin Fudge MD, FACS Previous Signature: 03/13/2015 2:43:14 PM Version By: Christin Fudge MD, FACS Entered By: Gretta Cool, RN, BSN, Kim on 03/13/2015 15:26:23

## 2015-03-20 ENCOUNTER — Encounter: Payer: Medicare Other | Admitting: Surgery

## 2015-03-20 DIAGNOSIS — I10 Essential (primary) hypertension: Secondary | ICD-10-CM | POA: Diagnosis not present

## 2015-03-20 DIAGNOSIS — E11622 Type 2 diabetes mellitus with other skin ulcer: Secondary | ICD-10-CM | POA: Diagnosis not present

## 2015-03-20 DIAGNOSIS — L97211 Non-pressure chronic ulcer of right calf limited to breakdown of skin: Secondary | ICD-10-CM | POA: Diagnosis not present

## 2015-03-20 DIAGNOSIS — S41112A Laceration without foreign body of left upper arm, initial encounter: Secondary | ICD-10-CM | POA: Diagnosis not present

## 2015-03-20 DIAGNOSIS — E11610 Type 2 diabetes mellitus with diabetic neuropathic arthropathy: Secondary | ICD-10-CM | POA: Diagnosis not present

## 2015-03-20 DIAGNOSIS — I87331 Chronic venous hypertension (idiopathic) with ulcer and inflammation of right lower extremity: Secondary | ICD-10-CM | POA: Diagnosis not present

## 2015-03-21 NOTE — Progress Notes (Addendum)
[name] Visit Report for 03/20/2015 Chief Complaint Document Details Patient Name: [name], [name] Date of Service: 03/20/2015 3:00 PM Medical Record Number: [id number] Patient Account Number: [id number] Date of Birth/Sex: 02/01/1928 (81 y.o. Female) Treating RN: [name] Primary Care Physician: [name] Other Clinician: Referring Physician: Ria [name] Treating Physician/Extender: [name] in Treatment: 31 Information Obtained from: Patient Chief Complaint Patient presents for treatment of an open ulcer due to venous insufficiency. 81 year old patient who is known to me from the [facility] wound care center now comes in follow up here for a chronic venous ulceration of her right lower extremity which she's had for about 6 months. Electronic Signature(s) Signed: 03/20/2015 3:48:10 PM By: [name] Fudge MD, FACS Entered By: [name] on 03/20/2015 15:48:10 [name], [name] -------------------------------------------------------------------------------- HPI Details Patient Name: [name], [name] Date of Service: 03/20/2015 3:00 PM Medical Record Number: [id number] Patient Account Number: [id number] Date of Birth/Sex: 09-20-1927 (81 y.o. Female) Treating RN: [name] Primary Care Physician: [name] Other Clinician: Referring Physician: Ria [name] Treating Physician/Extender: [name] in Treatment: 31 History of Present Illness Location: ulcer in the right lower extremity for several months Quality: Patient reports experiencing a dull pain to affected area(s). Severity: Patient states wound (s) are getting better. Duration: Patient has had the wound for > 3 months prior to seeking treatment at the wound center Timing: Pain in wound is Intermittent (comes and goes Context: The wound appeared gradually over time Modifying Factors: Consults to this date include: vascular  surgery for the veins in December 2015 and she's been seeing the wound care center at [facility] for several months. Associated Signs and Symptoms: Patient reports having difficulty standing for long periods. HPI Description: This is an 81 yrs old female we have been following since the beginning of January for a wound on the right medial ankle. She had previously had venous ablation of the greater saphenous vein by [name] She came to [place] with erythema of the right leg and subsequently is opened in the medial aspect of the right ankle. She has not been very compliant with compression. We ordered her at juxta lite stocking, however she apparently has not been able to wear it. The area on the medial aspect of her left ankle has much less induration and erythema. The edema is better controlled. No evidence of infection is noted. 07/18/14; no major change in the condition of this wound. There is still surface slough and probably some drainage. 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 All other veins showed no evidence of DVT, superficial venous thrombosis or incompetence. however she does have manifestations of chronic venous hypertension and I believe we will change her dressing over to Prisma and a Profore light dressing. she sees her primary care doctor regularly and her last hemoglobin A1c has been around 7%.her other comorbidities are hypertension, CABG in 1998, peptic ulcer disease, history of echo with a ejection fraction of 55-60% and grade 1 diastolic dysfunction. past surgical history include coronary artery bypass graft in 1998, cholecystectomy, appendectomy, hand surgery, endovenous ablation of the saphenous vein with laser by [name] in December 2015. 08/29/2014 - she complains of increased calf pain over the past week. removed her compression bandage and pain  improved. no fever or chills. Mild clear drainage. 09/04/2014 -- she has not been compliant and  has not done a dressing change as we had ordered last week. She always complains and does not keep her wrap on and this past week she has done the same. She has completed a course of doxycycline. 09/25/2014 -- she has tolerated her dressing all week and has not removed it. She says it's hurting a lot today and would not like any debridement. We will do so as per her wishes. she did get a appointment with the vascular surgeon and sees him on 6/28. [name] ([id number]) 10/03/2014 - She was seen by [name] 09/30/2014. Assessment: I reviewed the previous venous duplex exam study performed in September 2015 which reveals no options for treating this venous ulcer. There is no significant superficial venous reflux to treat with laser ablation.Most recent ABI was 0.91 in the right foot with palpable dorsalis pedis pulse at 3+ Plan: patient needs to return to the [place] wound center and be treated by [name] No arterial or venous options available from vascular standpoint or treatment of right leg stasis ulcer.Continued dressing changes and compression is the treatment of choice 10/16/2014 -- today will be her first application of Apligraf. 10/23/2014 -- we are going to be able to apply her second day of Apligraf next week. She is complaining of some tenderness in the region of Achilles tendon and this is possibly due to her wrap. 11/06/2014 -- she did not have Apligraf last week because the wound was not looking good but she says she is feeling much better now. 11/13/2014 -- she has been doing well this week and she is here for her second Apligraf. 11/27/2014 -- she is here for her third application of Apligraf 12/12/2014 -- she is here for her fourth application of Apligraf. Also during these past 3 days she had an injury to her left forearm which she caught against the edge of  her desk and has a lacerated wound which has gone down to the subcutaneous tissue. 12/26/2014 -- she is here for a last/fifth application of Apligraf. Electronic Signature(s) Signed: 03/20/2015 3:48:20 PM By: [name] Fudge MD, FACS Entered By: [name] on 03/20/2015 15:48:20 [name], [name] ([id number]) -------------------------------------------------------------------------------- Physical Exam Details Patient Name: Gaxiola, Yesha C. Date of Service: 03/20/2015 3:00 PM Medical Record Number: [id number] Patient Account Number: [id number] Date of Birth/Sex: [birth year] (81 y.o. Female) Treating RN: [name] Primary Care Physician: [name] Other Clinician: Referring Physician: Ria [name] Treating Physician/Extender: [name] in Treatment: 31 Constitutional . Pulse regular. Respirations normal and unlabored. Afebrile. . Eyes Nonicteric. Reactive to light. Ears, Nose, Mouth, and Throat Lips, teeth, and gums WNL.Marland Kitchen Moist mucosa without lesions . Neck supple and nontender. No palpable supraclavicular or cervical adenopathy. Normal sized without goiter. Respiratory WNL. No retractions.. Cardiovascular Pedal Pulses WNL. No clubbing, cyanosis or edema. Lymphatic No adneopathy. No adenopathy. No adenopathy. Musculoskeletal Adexa without tenderness or enlargement.. Digits and nails w/o clubbing, cyanosis, infection, petechiae, ischemia, or inflammatory conditions.. Integumentary (Hair, Skin) No suspicious lesions. No crepitus or fluctuance. No peri-wound warmth or erythema. No masses.Marland Kitchen Psychiatric Judgement and insight Intact.. No evidence of depression, anxiety, or agitation.. Notes the ulcer base is looking clean and after washing it with saline and there is no sharp debridement required. Electronic Signature(s) Signed: 03/20/2015 3:48:58 PM By: [name] Fudge MD, FACS Entered By: [name] on 03/20/2015 15:48:58 [name], [name]  ([id number]) -------------------------------------------------------------------------------- Physician Orders Details Patient Name: [name], [name] Date of Service: 03/20/2015 3:00 PM Medical Record Number: [id number] Patient Account  Number: [id number] Date of Birth/Sex: Apr 25, 1927 (81 y.o. Female) Treating RN: [name] Primary Care Physician: [name] Other Clinician: Referring Physician: Ria [name] Treating Physician/Extender: [name] in Treatment: 31 Verbal / Phone Orders: Yes Clinician: Carolyne Fiscal, [name] Read Back and Verified: Yes Diagnosis Coding ICD-10 Coding Code Description E11.622 Type 2 diabetes mellitus with other skin ulcer E11.610 Type 2 diabetes mellitus with diabetic neuropathic arthropathy Chronic venous hypertension (idiopathic) with ulcer and inflammation of right lower I87.331 extremity S41.112A Laceration without foreign body of left upper arm, initial encounter Wound Cleansing Wound #1 Right,Medial Lower Leg o Cleanse wound with mild soap and water Anesthetic Wound #1 Right,Medial Lower Leg o Topical Lidocaine 4% cream applied to wound bed prior to debridement Primary Wound Dressing Wound #1 Right,Medial Lower Leg o Other: - sorbact with hydrogel Secondary Dressing Wound #1 Right,Medial Lower Leg o ABD pad Dressing Change Frequency Wound #1 Right,Medial Lower Leg o Change dressing every week Follow-up Appointments Wound #1 Right,Medial Lower Leg o Return Appointment in 1 week. Edema Control Wound #1 Right,Medial Lower Leg [name], [name] ([id number]) o 2 Layer Compression System - Right Lower Extremity - mepitel lite on heel Electronic Signature(s) Signed: 03/20/2015 4:28:44 PM By: [name] Fudge MD, FACS Signed: 03/25/2015 4:19:08 PM By: Alric Quan Entered By: Alric Quan on 03/20/2015 15:59:01 Staley, [name]  ([id number]) -------------------------------------------------------------------------------- Problem List Details Patient Name: [name], [name] Date of Service: 03/20/2015 3:00 PM Medical Record Number: [id number] Patient Account Number: [id number] Date of Birth/Sex: 02/14/1928 (81 y.o. Female) Treating RN: [name] Primary Care Physician: [name] Other Clinician: Referring Physician: Ria [name] Treating Physician/Extender: [name] in Treatment: 31 Active Problems ICD-10 Encounter Code Description Active Date Diagnosis E11.622 Type 2 diabetes mellitus with other skin ulcer 08/15/2014 Yes E11.610 Type 2 diabetes mellitus with diabetic neuropathic 08/15/2014 Yes arthropathy I87.331 Chronic venous hypertension (idiopathic) with ulcer and 08/15/2014 Yes inflammation of right lower extremity S41.112A Laceration without foreign body of left upper arm, initial 12/12/2014 Yes encounter Inactive Problems Resolved Problems ICD-10 Code Description Active Date Resolved Date L03.115 Cellulitis of right lower limb 08/29/2014 08/29/2014 Electronic Signature(s) Signed: 03/20/2015 3:47:39 PM By: [name] Fudge MD, FACS Entered By: [name] on 03/20/2015 15:47:39 [name], [name] ([id number]) -------------------------------------------------------------------------------- Progress Note Details Patient Name: [name], [name] Date of Service: 03/20/2015 3:00 PM Medical Record Number: [id number] Patient Account Number: [id number] Date of Birth/Sex: May 05, 1927 (81 y.o. Female) Treating RN: [name] Primary Care Physician: [name] Other Clinician: Referring Physician: Ria [name] Treating Physician/Extender: [name] in Treatment: 31 Subjective Chief Complaint Information obtained from Patient Patient presents for treatment of an open ulcer due to venous insufficiency. 81 year old patient who is known to me from the  [facility] wound care center now comes in follow up here for a chronic venous ulceration of her right lower extremity which she's had for about 6 months. History of Present Illness (HPI) The following HPI elements were documented for the patient's wound: Location: ulcer in the right lower extremity for several months Quality: Patient reports experiencing a dull pain to affected area(s). Severity: Patient states wound (s) are getting better. Duration: Patient has had the wound for > 3 months prior to seeking treatment at the wound center Timing: Pain in wound is Intermittent (comes and goes Context: The wound appeared gradually over time Modifying Factors: Consults to this date include: vascular surgery for the veins in December 2015 and she's been seeing the wound care center at [facility] for several months. Associated Signs  and Symptoms: Patient reports having difficulty standing for long periods. This is an 81 yrs old female we have been following since the beginning of January for a wound on the right medial ankle. She had previously had venous ablation of the greater saphenous vein by [name] She came to [place] with erythema of the right leg and subsequently is opened in the medial aspect of the right ankle. She has not been very compliant with compression. We ordered her at juxta lite stocking, however she apparently has not been able to wear it. The area on the medial aspect of her left ankle has much less induration and erythema. The edema is better controlled. No evidence of infection is noted. 07/18/14; no major change in the condition of this wound. There is still surface slough and probably some drainage. 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 All other veins showed no evidence of DVT, superficial venous thrombosis or incompetence. however she does have  manifestations of chronic venous hypertension and I believe we will change her dressing over to Prisma and a Profore light dressing. she sees her primary care doctor regularly and her last hemoglobin A1c has been around 7%.her other comorbidities are hypertension, CABG in 1998, peptic ulcer disease, history of echo with a ejection fraction of 55-60% and grade 1 diastolic dysfunction. past surgical history include coronary artery bypass graft in 1998, cholecystectomy, appendectomy, hand surgery, endovenous ablation of the saphenous vein with laser by [name] in December 2015. [name], [name] ([id number]) 08/29/2014 - she complains of increased calf pain over the past week. removed her compression bandage and pain improved. no fever or chills. Mild clear drainage. 09/04/2014 -- she has not been compliant and has not done a dressing change as we had ordered last week. She always complains and does not keep her wrap on and this past week she has done the same. She has completed a course of doxycycline. 09/25/2014 -- she has tolerated her dressing all week and has not removed it. She says it's hurting a lot today and would not like any debridement. We will do so as per her wishes. she did get a appointment with the vascular surgeon and sees him on 6/28. 10/03/2014 - She was seen by [name] 09/30/2014. Assessment: I reviewed the previous venous duplex exam study performed in September 2015 which reveals no options for treating this venous ulcer. There is no significant superficial venous reflux to treat with laser ablation.Most recent ABI was 0.91 in the right foot with palpable dorsalis pedis pulse at 3+ Plan: patient needs to return to the [place] wound center and be treated by [name] No arterial or venous options available from vascular standpoint or treatment of right leg stasis ulcer.Continued dressing changes and compression is the treatment of choice 10/16/2014 -- today  will be her first application of Apligraf. 10/23/2014 -- we are going to be able to apply her second day of Apligraf next week. She is complaining of some tenderness in the region of Achilles tendon and this is possibly due to her wrap. 11/06/2014 -- she did not have Apligraf last week because the wound was not looking good but she says she is feeling much better now. 11/13/2014 -- she has been doing well this week and she is here for her second Apligraf. 11/27/2014 -- she is here for her  third application of Apligraf 12/12/2014 -- she is here for her fourth application of Apligraf. Also during these past 3 days she had an injury to her left forearm which she caught against the edge of her desk and has a lacerated wound which has gone down to the subcutaneous tissue. 12/26/2014 -- she is here for a last/fifth application of Apligraf. Objective Constitutional Pulse regular. Respirations normal and unlabored. Afebrile. Vitals Time Taken: 3:22 PM, Height: 58 in, Weight: 151 lbs, BMI: 31.6, Temperature: 98.1 F, Pulse: 71 bpm, Respiratory Rate: 18 breaths/min, Blood Pressure: 165/65 mmHg. Eyes Nonicteric. Reactive to light. Ears, Nose, Mouth, and Throat Common, Bobbette C. ([id number]) Lips, teeth, and gums WNL.Marland Kitchen Moist mucosa without lesions . Neck supple and nontender. No palpable supraclavicular or cervical adenopathy. Normal sized without goiter. Respiratory WNL. No retractions.. Cardiovascular Pedal Pulses WNL. No clubbing, cyanosis or edema. Lymphatic No adneopathy. No adenopathy. No adenopathy. Musculoskeletal Adexa without tenderness or enlargement.. Digits and nails w/o clubbing, cyanosis, infection, petechiae, ischemia, or inflammatory conditions.Marland Kitchen Psychiatric Judgement and insight Intact.. No evidence of depression, anxiety, or agitation.. General Notes: the ulcer base is looking clean and after washing it with saline and there is no sharp debridement required. Integumentary  (Hair, Skin) No suspicious lesions. No crepitus or fluctuance. No peri-wound warmth or erythema. No masses.. Wound #1 status is Open. Original cause of wound was Gradually Appeared. The wound is located on the Right,Medial Lower Leg. The wound measures 4.6cm length x 1cm width x 0.2cm depth; 3.613cm^2 area and 0.723cm^3 volume. The wound is limited to skin breakdown. There is no tunneling or undermining noted. There is a medium amount of serosanguineous drainage noted. The wound margin is distinct with the outline attached to the wound base. There is medium (34-66%) red, pink granulation within the wound bed. There is a medium (34-66%) amount of necrotic tissue within the wound bed including Adherent Slough. The periwound skin appearance exhibited: Localized Edema, Moist, Mottled, Erythema. The periwound skin appearance did not exhibit: Callus, Crepitus, Excoriation, Fluctuance, Friable, Induration, Rash, Scarring, Dry/Scaly, Maceration, Atrophie Blanche, Cyanosis, Ecchymosis, Hemosiderin Staining, Pallor, Rubor. The surrounding wound skin color is noted with erythema which is circumferential. Periwound temperature was noted as No Abnormality. The periwound has tenderness on palpation. Assessment Active Problems ICD-10 E11.622 - Type 2 diabetes mellitus with other skin ulcer E11.610 - Type 2 diabetes mellitus with diabetic neuropathic arthropathy [name], [name] C. ([id number]) I87.331 - Chronic venous hypertension (idiopathic) with ulcer and inflammation of right lower extremity S41.112A - Laceration without foreign body of left upper arm, initial encounter Plan Wound Cleansing: Wound #1 Right,Medial Lower Leg: Cleanse wound with mild soap and water Anesthetic: Wound #1 Right,Medial Lower Leg: Topical Lidocaine 4% cream applied to wound bed prior to debridement Primary Wound Dressing: Wound #1 Right,Medial Lower Leg: Other: - sorbact with hydrogel Secondary Dressing: Wound #1  Right,Medial Lower Leg: ABD pad Dressing Change Frequency: Wound #1 Right,Medial Lower Leg: Change dressing every week Follow-up Appointments: Wound #1 Right,Medial Lower Leg: Return Appointment in 1 week. Edema Control: Wound #1 Right,Medial Lower Leg: 2 Layer Compression System - Right Lower Extremity - mepitel lite on heel This week we will use Sorbact with hydrogel and with foam, and use 2 layer compression which is all she can tolerate. She is again encouraged to continue with exercise, elevation and good control of her diabetes. I have discussed with the patient that I am going to talk to the vendors to try and get her some samples of Puraply and  see if this is going to help her. She is agreeable about this. Electronic Signature(s) Signed: 03/20/2015 4:34:31 PM By: [name] Fudge MD, FACS Previous Signature: 03/20/2015 3:49:16 PM Version By: [name] Fudge MD, FACS SARAIH, GRIESEMER ([id number]) Entered By: [name] on 03/20/2015 16:34:31 Rickels, [name] ([id number]) -------------------------------------------------------------------------------- SuperBill Details Patient Name: Rummell, Anny C. Date of Service: 03/20/2015 Medical Record Number: [id number] Patient Account Number: [id number] Date of Birth/Sex: 1928/02/20 (81 y.o. Female) Treating RN: [name] Primary Care Physician: [name] Other Clinician: Referring Physician: Ria [name] Treating Physician/Extender: [name] in Treatment: 31 Diagnosis Coding ICD-10 Codes Code Description E11.622 Type 2 diabetes mellitus with other skin ulcer E11.610 Type 2 diabetes mellitus with diabetic neuropathic arthropathy Chronic venous hypertension (idiopathic) with ulcer and inflammation of right lower I87.331 extremity S41.112A Laceration without foreign body of left upper arm, initial encounter Facility Procedures CPT4: Description Modifier Quantity Code [id number] (Facility Use Only)  [phone] - APPLY [place] RT 1 LEG Physician Procedures CPT4: Description Modifier Quantity Code [id number] - WC PHYS LEVEL 3 - EST PT 1 ICD-10 Description Diagnosis E11.622 Type 2 diabetes mellitus with other skin ulcer I87.331 Chronic venous hypertension (idiopathic) with ulcer and inflammation of right  lower extremity E11.610 Type 2 diabetes mellitus with diabetic neuropathic arthropathy Electronic Signature(s) Signed: 03/20/2015 4:34:45 PM By: [name] Fudge MD, FACS Signed: 03/25/2015 4:19:08 PM By: Alric Quan Previous Signature: 03/20/2015 3:50:12 PM Version By: [name] Fudge MD, FACS Entered By: Alric Quan on 03/20/2015 16:33:54

## 2015-03-26 ENCOUNTER — Encounter: Payer: Medicare Other | Admitting: Surgery

## 2015-03-26 DIAGNOSIS — L97211 Non-pressure chronic ulcer of right calf limited to breakdown of skin: Secondary | ICD-10-CM | POA: Diagnosis not present

## 2015-03-26 DIAGNOSIS — S41112A Laceration without foreign body of left upper arm, initial encounter: Secondary | ICD-10-CM | POA: Diagnosis not present

## 2015-03-26 DIAGNOSIS — E11622 Type 2 diabetes mellitus with other skin ulcer: Secondary | ICD-10-CM | POA: Diagnosis not present

## 2015-03-26 DIAGNOSIS — E11610 Type 2 diabetes mellitus with diabetic neuropathic arthropathy: Secondary | ICD-10-CM | POA: Diagnosis not present

## 2015-03-26 DIAGNOSIS — I10 Essential (primary) hypertension: Secondary | ICD-10-CM | POA: Diagnosis not present

## 2015-03-26 DIAGNOSIS — I87331 Chronic venous hypertension (idiopathic) with ulcer and inflammation of right lower extremity: Secondary | ICD-10-CM | POA: Diagnosis not present

## 2015-03-26 NOTE — Progress Notes (Signed)
[name], [name] ([id number]) Visit Report for 03/20/2015 Arrival Information Details Patient Name: [name], [name] Date of Service: 03/20/2015 3:00 PM Medical Record Number: [id number] Patient Account Number: [id number] Date of Birth/Sex: December 26, 1927 (81 y.o. Female) Treating RN: Carolyne Fiscal, Debi Primary Care Physician: Ria Bush Other Clinician: Referring Physician: Ria Bush Treating Physician/Extender: Frann Rider in Treatment: 39 Visit Information History Since Last Visit All ordered tests and consults were completed: No Patient Arrived: Walker Added or deleted any medications: No Arrival Time: 15:17 Any new allergies or adverse reactions: No Accompanied By: self Had a fall or experienced change in No Transfer Assistance: None activities of daily living that may affect Patient Identification Verified: Yes risk of falls: Secondary Verification Process Yes Signs or symptoms of abuse/neglect since last No Completed: visito Patient Requires Transmission- No Hospitalized since last visit: No Based Precautions: Pain Present Now: No Patient Has Alerts: Yes Patient Alerts: Patient on Blood Thinner Type II Diabetic 81 MG aspirin Electronic Signature(s) Signed: 03/25/2015 4:19:08 PM By: Alric Quan Entered By: Alric Quan on 03/20/2015 15:22:06 Hovater, [name] ([id number]) -------------------------------------------------------------------------------- Encounter Discharge Information Details Patient Name: [name], [name] Date of Service: 03/20/2015 3:00 PM Medical Record Number: [id number] Patient Account Number: [id number] Date of Birth/Sex: [birth year] (81 y.o. Female) Treating RN: Carolyne Fiscal, Debi Primary Care Physician: Ria Bush Other Clinician: Referring Physician: Ria Bush Treating Physician/Extender: Frann Rider in Treatment: 31 Encounter Discharge Information Items Facility Notification Discharge Pain  Level: 0 Telephoned: Yes Discharge Condition: Stable Orders Sent: Yes Ambulatory Status: Walker Additional Notification Discharge Destination: Home Telephoned: Yes Transportation: Private Auto Orders Sent: Yes Accompanied By: self Schedule Follow-up Appointment: Yes Medication Reconciliation completed and provided to Patient/Care Yes Media Pizzini: Provided on Clinical Summary of Care: 03/20/2015 Form Type Recipient Paper Patient LL Electronic Signature(s) Signed: 03/20/2015 4:01:43 PM By: Ruthine Dose Entered By: Ruthine Dose on 03/20/2015 16:01:43 Vavrek, [name] ([id number]) -------------------------------------------------------------------------------- Lower Extremity Assessment Details Patient Name: [name], [name] Date of Service: 03/20/2015 3:00 PM Medical Record Number: [id number] Patient Account Number: [id number] Date of Birth/Sex: 12-27-1927 (81 y.o. Female) Treating RN: Carolyne Fiscal, Debi Primary Care Physician: Ria Bush Other Clinician: Referring Physician: Ria Bush Treating Physician/Extender: Frann Rider in Treatment: 31 Edema Assessment Assessed: [Left: No] [Right: No] Edema: [Left: Ye] [Right: s] Calf Left: Right: Point of Measurement: 27 cm From Medial Instep cm 25.5 cm Ankle Left: Right: Point of Measurement: 10 cm From Medial Instep cm 19.7 cm Vascular Assessment Pulses: Posterior Tibial Dorsalis Pedis Palpable: [Right:Yes] Extremity colors, hair growth, and conditions: Extremity Color: [Right:Mottled] Hair Growth on Extremity: [Right:No] Temperature of Extremity: [Right:Warm] Capillary Refill: [Right:< 3 seconds] Toe Nail Assessment Left: Right: Thick: No Discolored: No Deformed: No Improper Length and Hygiene: No Electronic Signature(s) Signed: 03/25/2015 4:19:08 PM By: Alric Quan Entered By: Alric Quan on 03/20/2015 15:31:22 Eltringham, Latarshia CMarland Kitchen  ([id number]) -------------------------------------------------------------------------------- Multi Wound Chart Details Patient Name: [name], [name] Date of Service: 03/20/2015 3:00 PM Medical Record Number: [id number] Patient Account Number: [id number] Date of Birth/Sex: 1927-08-07 (81 y.o. Female) Treating RN: Carolyne Fiscal, Debi Primary Care Physician: Ria Bush Other Clinician: Referring Physician: Ria Bush Treating Physician/Extender: Frann Rider in Treatment: 31 Vital Signs Height(in): 58 Pulse(bpm): 71 Weight(lbs): 151 Blood Pressure 165/65 (mmHg): Body Mass Index(BMI): 32 Temperature(F): 98.1 Respiratory Rate 18 (breaths/min): Photos: [1:No Photos] [N/A:N/A] Wound Location: [1:Right Lower Leg - Medial] [N/A:N/A] Wounding Event: [1:Gradually Appeared] [N/A:N/A] Primary Etiology: [1:Venous Leg Ulcer] [N/A:N/A] Comorbid History: [1:Cataracts, Anemia, Coronary Artery Disease, Hypertension, Myocardial Infarction, Type II Diabetes, Osteoarthritis] [  N/A:N/A] Date Acquired: [1:04/04/2014] [N/A:N/A] Weeks of Treatment: [1:31] [N/A:N/A] Wound Status: [1:Open] [N/A:N/A] Measurements L x W x D 4.6x1x0.2 [N/A:N/A] (cm) Area (cm) : [1:3.613] [N/A:N/A] Volume (cm) : [1:0.723] [N/A:N/A] % Reduction in Area: [1:38.70%] [N/A:N/A] % Reduction in Volume: 59.10% [N/A:N/A] Classification: [1:Full Thickness Without Exposed Support Structures] [N/A:N/A] HBO Classification: [1:Grade 1] [N/A:N/A] Exudate Amount: [1:Medium] [N/A:N/A] Exudate Type: [1:Serosanguineous] [N/A:N/A] Exudate Color: [1:red, brown] [N/A:N/A] Foul Odor After [1:Yes] [N/A:N/A] Cleansing: Odor Anticipated Due to No [N/A:N/A] Product Use: Wound Margin: [1:Distinct, outline attached] [N/A:N/A] Granulation Amount: Medium (34-66%) N/A N/A Granulation Quality: Red, Pink N/A N/A Necrotic Amount: Medium (34-66%) N/A N/A Exposed Structures: Fascia: No N/A N/A Fat: No Tendon: No Muscle:  No Joint: No Bone: No Limited to Skin Breakdown Epithelialization: None N/A N/A Periwound Skin Texture: Edema: Yes N/A N/A Excoriation: No Induration: No Callus: No Crepitus: No Fluctuance: No Friable: No Rash: No Scarring: No Periwound Skin Moist: Yes N/A N/A Moisture: Maceration: No Dry/Scaly: No Periwound Skin Color: Erythema: Yes N/A N/A Mottled: Yes Atrophie Blanche: No Cyanosis: No Ecchymosis: No Hemosiderin Staining: No Pallor: No Rubor: No Erythema Location: Circumferential N/A N/A Temperature: No Abnormality N/A N/A Tenderness on Yes N/A N/A Palpation: Wound Preparation: Ulcer Cleansing: Other: N/A N/A soap and water Topical Anesthetic Applied: Other: lidocaine 4% Treatment Notes Electronic Signature(s) Signed: 03/25/2015 4:19:08 PM By: Alric Quan Entered By: Alric Quan on 03/20/2015 15:33:37 Lutterman, [name] ([id number]) Gleaves, [name] ([id number]) -------------------------------------------------------------------------------- [place] Details Patient Name: Hancox, Ovella C. Date of Service: 03/20/2015 3:00 PM Medical Record Number: [id number] Patient Account Number: [id number] Date of Birth/Sex: [birth year] (81 y.o. Female) Treating RN: Carolyne Fiscal, Debi Primary Care Physician: Ria Bush Other Clinician: Referring Physician: Ria Bush Treating Physician/Extender: Frann Rider in Treatment: 38 Active Inactive Abuse / Safety / Falls / Self Care Management Nursing Diagnoses: Potential for falls Goals: Patient will remain injury free Date Initiated: 08/15/2014 Goal Status: Active Interventions: Assess fall risk on admission and as needed Notes: Nutrition Nursing Diagnoses: Impaired glucose control: actual or potential Goals: Patient/caregiver verbalizes understanding of need to maintain therapeutic glucose control per primary care physician Date Initiated: 08/15/2014 Goal Status:  Active Interventions: Assess patient nutrition upon admission and as needed per policy Notes: Orientation to the Wound Care Program Nursing Diagnoses: Knowledge deficit related to the wound healing center program Goals: Patient/caregiver will verbalize understanding of the [place], Jaklyn C. ([id number]) Date Initiated: 08/15/2014 Goal Status: Active Interventions: Provide education on orientation to the wound center Notes: Venous Leg Ulcer Nursing Diagnoses: Actual venous Insuffiency (use after diagnosis is confirmed) Goals: Patient will maintain optimal edema control Date Initiated: 08/15/2014 Goal Status: Active Interventions: Compression as ordered Treatment Activities: Test ordered outside of clinic : 03/20/2015 Notes: Wound/Skin Impairment Nursing Diagnoses: Impaired tissue integrity Goals: Patient/caregiver will verbalize understanding of skin care regimen Date Initiated: 08/15/2014 Goal Status: Active Interventions: Assess patient/caregiver ability to obtain necessary supplies Notes: Electronic Signature(s) Signed: 03/25/2015 4:19:08 PM By: Alric Quan Entered By: Alric Quan on 03/20/2015 15:33:29 Geesey, Haydn C. ([id number]) -------------------------------------------------------------------------------- Pain Assessment Details Patient Name: Soderholm, Mayumi C. Date of Service: 03/20/2015 3:00 PM Medical Record Number: [id number] Patient Account Number: [id number] Date of Birth/Sex: 1927/12/09 (81 y.o. Female) Treating RN: Carolyne Fiscal, Debi Primary Care Physician: Ria Bush Other Clinician: Referring Physician: Ria Bush Treating Physician/Extender: Frann Rider in Treatment: 31 Active Problems Location of Pain Severity and Description of Pain Patient Has [name] No Site Locations With Dressing Change: Yes Character of Pain Pain  Management and Medication Current Pain Management: Electronic  Signature(s) Signed: 03/25/2015 4:19:08 PM By: Alric Quan Entered By: Alric Quan on 03/20/2015 15:22:12 Laba, [name] ([id number]) -------------------------------------------------------------------------------- Patient/Caregiver Education Details Patient Name: Duckett, Charley C. Date of Service: 03/20/2015 3:00 PM Medical Record Number: [id number] Patient Account Number: [id number] Date of Birth/Gender: 11/24/1927 (81 y.o. Female) Treating RN: Carolyne Fiscal, Debi Primary Care Physician: Ria Bush Other Clinician: Referring Physician: Ria Bush Treating Physician/Extender: Frann Rider in Treatment: 31 Education Assessment Education Provided To: Patient Education Topics Provided Wound/Skin Impairment: Handouts: Other: do not get wrap wet Methods: Demonstration, Explain/Verbal Responses: State content correctly Electronic Signature(s) Signed: 03/25/2015 4:19:08 PM By: Alric Quan Entered By: Alric Quan on 03/20/2015 16:01:37 Mandelbaum, [name] ([id number]) -------------------------------------------------------------------------------- Wound Assessment Details Patient Name: Foxworth, Kalina C. Date of Service: 03/20/2015 3:00 PM Medical Record Number: [id number] Patient Account Number: [id number] Date of Birth/Sex: 1928-02-01 (81 y.o. Female) Treating RN: Carolyne Fiscal, Debi Primary Care Physician: Ria Bush Other Clinician: Referring Physician: Ria Bush Treating Physician/Extender: Frann Rider in Treatment: 31 Wound Status Wound Number: 1 Primary Venous Leg Ulcer Etiology: Wound Location: Right Lower Leg - Medial Wound Open Wounding Event: Gradually Appeared Status: Date Acquired: 04/04/2014 Comorbid Cataracts, Anemia, Coronary Artery Weeks Of Treatment: 31 History: Disease, Hypertension, Myocardial Clustered Wound: No Infarction, Type II Diabetes, Osteoarthritis Photos Photo Uploaded By: Alric Quan  on 03/20/2015 16:58:43 Wound Measurements Length: (cm) 4.6 Width: (cm) 1 Depth: (cm) 0.2 Area: (cm) 3.613 Volume: (cm) 0.723 % Reduction in Area: 38.7% % Reduction in Volume: 59.1% Epithelialization: None Tunneling: No Undermining: No Wound Description Full Thickness Without Exposed Foul Odor Af Classification: Support Structures Due to Produ Diabetic Severity Grade 1 (Wagner): Wound Margin: Distinct, outline attached Exudate Amount: Medium Exudate Type: Serosanguineous Exudate Color: red, brown ter Cleansing: Yes ct Use: No Wound Bed Boomershine, Ashelyn C. ([id number]) Granulation Amount: Medium (34-66%) Exposed Structure Granulation Quality: Red, Pink Fascia Exposed: No Necrotic Amount: Medium (34-66%) Fat Layer Exposed: No Necrotic Quality: Adherent Slough Tendon Exposed: No Muscle Exposed: No Joint Exposed: No Bone Exposed: No Limited to Skin Breakdown Periwound Skin Texture Texture Color No Abnormalities Noted: No No Abnormalities Noted: No Callus: No Atrophie Blanche: No Crepitus: No Cyanosis: No Excoriation: No Ecchymosis: No Fluctuance: No Erythema: Yes Friable: No Erythema Location: Circumferential Induration: No Hemosiderin Staining: No Localized Edema: Yes Mottled: Yes Rash: No Pallor: No Scarring: No Rubor: No Moisture Temperature / Pain No Abnormalities Noted: No Temperature: No Abnormality Dry / Scaly: No Tenderness on Palpation: Yes Maceration: No Moist: Yes Wound Preparation Ulcer Cleansing: Other: soap and water, Topical Anesthetic Applied: Other: lidocaine 4%, Treatment Notes Wound #1 (Right, Medial Lower Leg) 1. Cleansed with: Clean wound with Normal Saline 2. Anesthetic Topical Lidocaine 4% cream to wound bed prior to debridement 4. Dressing Applied: Other dressing (specify in notes) 5. Secondary Dressing Applied ABD Pad 7. Secured with 2 Layer Compression System - Right Lower Extremity Notes sorbact with hydrogel  and siltec to heel Maguire, WALKER MEYERSON ([id number]) Electronic Signature(s) Signed: 03/25/2015 4:19:08 PM By: Alric Quan Entered By: Alric Quan on 03/20/2015 15:33:20 Guida, [name] ([id number]) -------------------------------------------------------------------------------- Vitals Details Patient Name: Follmer, Avalon C. Date of Service: 03/20/2015 3:00 PM Medical Record Number: [id number] Patient Account Number: [id number] Date of Birth/Sex: [birth year] (81 y.o. Female) Treating RN: Carolyne Fiscal, Debi Primary Care Physician: Ria Bush Other Clinician: Referring Physician: Ria Bush Treating Physician/Extender: Frann Rider in Treatment: 31 Vital Signs Time Taken: 15:22 Temperature (F): 98.1 Height (in): 58 Pulse (bpm): 71 Weight (  lbs): 151 Respiratory Rate (breaths/min): 18 Body Mass Index (BMI): 31.6 Blood Pressure (mmHg): 165/65 Reference Range: 80 - 120 mg / dl Electronic Signature(s) Signed: 03/25/2015 4:19:08 PM By: Alric Quan Entered By: Alric Quan on 03/20/2015 15:24:05

## 2015-03-27 NOTE — Progress Notes (Signed)
[name] Visit Report for 03/26/2015 Chief Complaint Document Details Patient Name: [name], [name] Date of Service: 03/26/2015 1:30 PM Medical Record Number: [id number] Patient Account Number: [id number] Date of Birth/Sex: September 26, 1927 (81 y.o. Female) Treating RN: Carolyne Fiscal, Debi Primary Care Physician: Ria Bush Other Clinician: Referring Physician: Ria Bush Treating Physician/Extender: Frann Rider in Treatment: 31 Information Obtained from: Patient Chief Complaint Patient presents for treatment of an open ulcer due to venous insufficiency. 81 year old patient who is known to me from the [facility] wound care center now comes in follow up here for a chronic venous ulceration of her right lower extremity which she's had for about 6 months. Electronic Signature(s) Signed: 03/26/2015 2:25:59 PM By: Christin Fudge MD, FACS Entered By: Christin Fudge on 03/26/2015 14:25:58 [name] -------------------------------------------------------------------------------- HPI Details Patient Name: [name], [name] Date of Service: 03/26/2015 1:30 PM Medical Record Number: [id number] Patient Account Number: [id number] Date of Birth/Sex: 1927-11-12 (81 y.o. Female) Treating RN: Carolyne Fiscal, Debi Primary Care Physician: Ria Bush Other Clinician: Referring Physician: Ria Bush Treating Physician/Extender: Frann Rider in Treatment: 31 History of Present Illness Location: ulcer in the right lower extremity for several months Quality: Patient reports experiencing a dull pain to affected area(s). Severity: Patient states wound (s) are getting better. Duration: Patient has had the wound for > 3 months prior to seeking treatment at the wound center Timing: Pain in wound is Intermittent (comes and goes Context: The wound appeared gradually over time Modifying Factors: Consults to this date include: vascular  surgery for the veins in December 2015 and she's been seeing the wound care center at [facility] for several months. Associated Signs and Symptoms: Patient reports having difficulty standing for long periods. HPI Description: This is an 81 yrs old female we have been following since the beginning of January for a wound on the right medial ankle. She had previously had venous ablation of the greater saphenous vein by Dr. Kellie Simmering. She came to [place] with erythema of the right leg and subsequently is opened in the medial aspect of the right ankle. She has not been very compliant with compression. We ordered her at juxta lite stocking, however she apparently has not been able to wear it. The area on the medial aspect of her left ankle has much less induration and erythema. The edema is better controlled. No evidence of infection is noted. 07/18/14; no major change in the condition of this wound. There is still surface slough and probably some drainage. 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 All other veins showed no evidence of DVT, superficial venous thrombosis or incompetence. however she does have manifestations of chronic venous hypertension and I believe we will change her dressing over to Prisma and a Profore light dressing. she sees her primary care doctor regularly and her last hemoglobin A1c has been around 7%.her other comorbidities are hypertension, CABG in 1998, peptic ulcer disease, history of echo with a ejection fraction of 55-60% and grade 1 diastolic dysfunction. past surgical history include coronary artery bypass graft in 1998, cholecystectomy, appendectomy, hand surgery, endovenous ablation of the saphenous vein with laser by Dr. Kellie Simmering in December 2015. 08/29/2014 - she complains of increased calf pain over the past week. removed her compression bandage and pain  improved. no fever or chills. Mild clear drainage. 09/04/2014 -- she has not been compliant and  has not done a dressing change as we had ordered last week. She always complains and does not keep her wrap on and this past week she has done the same. She has completed a course of doxycycline. 09/25/2014 -- she has tolerated her dressing all week and has not removed it. She says it's hurting a lot today and would not like any debridement. We will do so as per her wishes. she did get a appointment with the vascular surgeon and sees him on 6/28. [name] ([id number]) 10/03/2014 - She was seen by Dr. Tinnie Gens 09/30/2014. Assessment: I reviewed the previous venous duplex exam study performed in September 2015 which reveals no options for treating this venous ulcer. There is no significant superficial venous reflux to treat with laser ablation.Most recent ABI was 0.91 in the right foot with palpable dorsalis pedis pulse at 3+ Plan: patient needs to return to the [place] wound center and be treated by Dr. Con Memos. No arterial or venous options available from vascular standpoint or treatment of right leg stasis ulcer.Continued dressing changes and compression is the treatment of choice 10/16/2014 -- today will be her first application of Apligraf. 10/23/2014 -- we are going to be able to apply her second day of Apligraf next week. She is complaining of some tenderness in the region of Achilles tendon and this is possibly due to her wrap. 11/06/2014 -- she did not have Apligraf last week because the wound was not looking good but she says she is feeling much better now. 11/13/2014 -- she has been doing well this week and she is here for her second Apligraf. 11/27/2014 -- she is here for her third application of Apligraf 12/12/2014 -- she is here for her fourth application of Apligraf. Also during these past 3 days she had an injury to her left forearm which she caught against the edge of  her desk and has a lacerated wound which has gone down to the subcutaneous tissue. 12/26/2014 -- she is here for a last/fifth application of Apligraf. Electronic Signature(s) Signed: 03/26/2015 2:26:06 PM By: Christin Fudge MD, FACS Entered By: Christin Fudge on 03/26/2015 14:26:06 [name] ([id number]) -------------------------------------------------------------------------------- Physical Exam Details Patient Name: Eskelson, Aara C. Date of Service: 03/26/2015 1:30 PM Medical Record Number: [id number] Patient Account Number: [id number] Date of Birth/Sex: Mar 12, 1928 (81 y.o. Female) Treating RN: Carolyne Fiscal, Debi Primary Care Physician: Ria Bush Other Clinician: Referring Physician: Ria Bush Treating Physician/Extender: Frann Rider in Treatment: 31 Constitutional . Pulse regular. Respirations normal and unlabored. Afebrile. . Eyes Nonicteric. Reactive to light. Ears, Nose, Mouth, and Throat Lips, teeth, and gums WNL.Marland Kitchen Moist mucosa without lesions. Neck supple and nontender. No palpable supraclavicular or cervical adenopathy. Normal sized without goiter. Respiratory WNL. No retractions.. Cardiovascular Pedal Pulses WNL. No clubbing, cyanosis or edema. Gastrointestinal (GI) Abdomen without masses or tenderness.. No liver or spleen enlargement or tenderness.. Genitourinary (GU) No hydrocele, spermatocele, tenderness of the cord, or testicular mass.Marland Kitchen Penis without lesions.Lowella Fairy without lesions. No cystocele, or rectocele. Pelvic support intact, no discharge.Marland Kitchen Urethra without masses, tenderness or scarring.Marland Kitchen Lymphatic No adneopathy. No adenopathy. No adenopathy. Musculoskeletal Adexa without tenderness or enlargement.. Digits and nails w/o clubbing, cyanosis, infection, petechiae, ischemia, or inflammatory conditions.. Integumentary (Hair, Skin) No suspicious lesions. No crepitus or fluctuance. No peri-wound warmth or erythema. No  masses.Marland Kitchen Psychiatric Judgement and insight Intact.. No evidence of depression, anxiety, or agitation.. Notes the ulcer base on the right lower extremity is looking very clean and no sharp debridement  was required today. Electronic Signature(s) Signed: 03/26/2015 2:26:51 PM By: Christin Fudge MD, FACS Entered By: Christin Fudge on 03/26/2015 14:26:48 [name] [name] -------------------------------------------------------------------------------- Physician Orders Details Patient Name: [name], Massiel C. Date of Service: 03/26/2015 1:30 PM Medical Record Number: [id number] Patient Account Number: [id number] Date of Birth/Sex: September 03, 1927 (81 y.o. Female) Treating RN: Carolyne Fiscal, Debi Primary Care Physician: Ria Bush Other Clinician: Referring Physician: Ria Bush Treating Physician/Extender: Frann Rider in Treatment: 7 Verbal / Phone Orders: Yes Clinician: Carolyne Fiscal, Debi Read Back and Verified: Yes Diagnosis Coding Wound Cleansing Wound #1 Right,Medial Lower Leg o Cleanse wound with mild soap and water Anesthetic Wound #1 Right,Medial Lower Leg o Topical Lidocaine 4% cream applied to wound bed prior to debridement Primary Wound Dressing Wound #1 Right,Medial Lower Leg o Promogran o Foam - siltec to protect heel o Other: - sorbact Secondary Dressing Wound #1 Right,Medial Lower Leg o ABD pad Dressing Change Frequency Wound #1 Right,Medial Lower Leg o Change dressing every week Follow-up Appointments Wound #1 Right,Medial Lower Leg o Return Appointment in 1 week. Edema Control Wound #1 Right,Medial Lower Leg o 2 Layer Compression System - Right Lower Extremity o Elevate legs to the level of the heart and pump ankles as often as possible Electronic Signature(s) Signed: 03/26/2015 5:05:23 PM By: Christin Fudge MD, FACS Signed: 03/26/2015 5:45:13 PM By: Lyman Speller  ([id number]) Entered By: Alric Quan on 03/26/2015 14:15:39 Grunewald, [name] -------------------------------------------------------------------------------- Problem List Details Patient Name: Lawrance, Mada C. Date of Service: 03/26/2015 1:30 PM Medical Record Number: [id number] Patient Account Number: [id number] Date of Birth/Sex: March 11, 1928 (81 y.o. Female) Treating RN: Carolyne Fiscal, Debi Primary Care Physician: Ria Bush Other Clinician: Referring Physician: Ria Bush Treating Physician/Extender: Frann Rider in Treatment: 31 Active Problems ICD-10 Encounter Code Description Active Date Diagnosis E11.622 Type 2 diabetes mellitus with other skin ulcer 08/15/2014 Yes E11.610 Type 2 diabetes mellitus with diabetic neuropathic 08/15/2014 Yes arthropathy I87.331 Chronic venous hypertension (idiopathic) with ulcer and 08/15/2014 Yes inflammation of right lower extremity S41.112A Laceration without foreign body of left upper arm, initial 12/12/2014 Yes encounter Inactive Problems Resolved Problems ICD-10 Code Description Active Date Resolved Date L03.115 Cellulitis of right lower limb 08/29/2014 08/29/2014 Electronic Signature(s) Signed: 03/26/2015 2:25:42 PM By: Christin Fudge MD, FACS Entered By: Christin Fudge on 03/26/2015 14:25:41 Winther, [name] -------------------------------------------------------------------------------- Progress Note Details Patient Name: [name], [name] Date of Service: 03/26/2015 1:30 PM Medical Record Number: [id number] Patient Account Number: [id number] Date of Birth/Sex: Aug 12, 1927 (81 y.o. Female) Treating RN: Carolyne Fiscal, Debi Primary Care Physician: Ria Bush Other Clinician: Referring Physician: Ria Bush Treating Physician/Extender: Frann Rider in Treatment: 31 Subjective Chief Complaint Information obtained from Patient Patient presents for treatment of an open ulcer  due to venous insufficiency. 81 year old patient who is known to me from the [facility] wound care center now comes in follow up here for a chronic venous ulceration of her right lower extremity which she's had for about 6 months. History of Present Illness (HPI) The following HPI elements were documented for the patient's wound: Location: ulcer in the right lower extremity for several months Quality: Patient reports experiencing a dull pain to affected area(s). Severity: Patient states wound (s) are getting better. Duration: Patient has had the wound for > 3 months prior to seeking treatment at the wound center Timing: Pain in wound is Intermittent (comes and goes Context: The wound appeared gradually over time Modifying Factors: Consults to this date include: vascular  surgery for the veins in December 2015 and she's been seeing the wound care center at [facility] for several months. Associated Signs and Symptoms: Patient reports having difficulty standing for long periods. This is an 81 yrs old female we have been following since the beginning of January for a wound on the right medial ankle. She had previously had venous ablation of the greater saphenous vein by Dr. Kellie Simmering. She came to [place] with erythema of the right leg and subsequently is opened in the medial aspect of the right ankle. She has not been very compliant with compression. We ordered her at juxta lite stocking, however she apparently has not been able to wear it. The area on the medial aspect of her left ankle has much less induration and erythema. The edema is better controlled. No evidence of infection is noted. 07/18/14; no major change in the condition of this wound. There is still surface slough and probably some drainage. 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 All other veins showed no evidence  of DVT, superficial venous thrombosis or incompetence. however she does have manifestations of chronic venous hypertension and I believe we will change her dressing over to Prisma and a Profore light dressing. she sees her primary care doctor regularly and her last hemoglobin A1c has been around 7%.her other comorbidities are hypertension, CABG in 1998, peptic ulcer disease, history of echo with a ejection fraction of 55-60% and grade 1 diastolic dysfunction. past surgical history include coronary artery bypass graft in 1998, cholecystectomy, appendectomy, hand surgery, endovenous ablation of the saphenous vein with laser by Dr. Kellie Simmering in December 2015. DANIELIZ, FORTHMAN ([id number]) 08/29/2014 - she complains of increased calf pain over the past week. removed her compression bandage and pain improved. no fever or chills. Mild clear drainage. 09/04/2014 -- she has not been compliant and has not done a dressing change as we had ordered last week. She always complains and does not keep her wrap on and this past week she has done the same. She has completed a course of doxycycline. 09/25/2014 -- she has tolerated her dressing all week and has not removed it. She says it's hurting a lot today and would not like any debridement. We will do so as per her wishes. she did get a appointment with the vascular surgeon and sees him on 6/28. 10/03/2014 - She was seen by Dr. Tinnie Gens 09/30/2014. Assessment: I reviewed the previous venous duplex exam study performed in September 2015 which reveals no options for treating this venous ulcer. There is no significant superficial venous reflux to treat with laser ablation.Most recent ABI was 0.91 in the right foot with palpable dorsalis pedis pulse at 3+ Plan: patient needs to return to the [place] wound center and be treated by Dr. Con Memos. No arterial or venous options available from vascular standpoint or treatment of right leg stasis ulcer.Continued  dressing changes and compression is the treatment of choice 10/16/2014 -- today will be her first application of Apligraf. 10/23/2014 -- we are going to be able to apply her second day of Apligraf next week. She is complaining of some tenderness in the region of Achilles tendon and this is possibly due to her wrap. 11/06/2014 -- she did not have Apligraf last week because the wound was not looking good but she says she is feeling much better now. 11/13/2014 --  she has been doing well this week and she is here for her second Apligraf. 11/27/2014 -- she is here for her third application of Apligraf 12/12/2014 -- she is here for her fourth application of Apligraf. Also during these past 3 days she had an injury to her left forearm which she caught against the edge of her desk and has a lacerated wound which has gone down to the subcutaneous tissue. 12/26/2014 -- she is here for a last/fifth application of Apligraf. Objective Constitutional Pulse regular. Respirations normal and unlabored. Afebrile. Vitals Time Taken: 1:37 PM, Height: 58 in, Weight: 151 lbs, BMI: 31.6, Temperature: 98.1 F, Pulse: 79 bpm, Respiratory Rate: 18 breaths/min, Blood Pressure: 143/67 mmHg. Eyes Nonicteric. Reactive to light. Ears, Nose, Mouth, and Throat Lips, teeth, and gums WNL.Marland Kitchen Moist mucosa without lesions. [name], [name] Neck supple and nontender. No palpable supraclavicular or cervical adenopathy. Normal sized without goiter. Respiratory WNL. No retractions.. Cardiovascular Pedal Pulses WNL. No clubbing, cyanosis or edema. Gastrointestinal (GI) Abdomen without masses or tenderness.. No liver or spleen enlargement or tenderness.. Genitourinary (GU) No hydrocele, spermatocele, tenderness of the cord, or testicular mass.Marland Kitchen Penis without lesions.Lowella Fairy without lesions. No cystocele, or rectocele. Pelvic support intact, no discharge.Marland Kitchen Urethra without masses, tenderness or  scarring.Marland Kitchen Lymphatic No adneopathy. No adenopathy. No adenopathy. Musculoskeletal Adexa without tenderness or enlargement.. Digits and nails w/o clubbing, cyanosis, infection, petechiae, ischemia, or inflammatory conditions.Marland Kitchen Psychiatric Judgement and insight Intact.. No evidence of depression, anxiety, or agitation.. General Notes: the ulcer base on the right lower extremity is looking very clean and no sharp debridement was required today. Integumentary (Hair, Skin) No suspicious lesions. No crepitus or fluctuance. No peri-wound warmth or erythema. No masses.. Wound #1 status is Open. Original cause of wound was Gradually Appeared. The wound is located on the Right,Medial Lower Leg. The wound measures 4.6cm length x 0.5cm width x 0.2cm depth; 1.806cm^2 area and 0.361cm^3 volume. The wound is limited to skin breakdown. There is no tunneling or undermining noted. There is a medium amount of serosanguineous drainage noted. The wound margin is distinct with the outline attached to the wound base. There is large (67-100%) red, pink granulation within the wound bed. There is no necrotic tissue within the wound bed. The periwound skin appearance exhibited: Localized Edema, Moist, Mottled, Erythema. The periwound skin appearance did not exhibit: Callus, Crepitus, Excoriation, Fluctuance, Friable, Induration, Rash, Scarring, Dry/Scaly, Maceration, Atrophie Blanche, Cyanosis, Ecchymosis, Hemosiderin Staining, Pallor, Rubor. The surrounding wound skin color is noted with erythema which is circumferential. Periwound temperature was noted as No Abnormality. The periwound has tenderness on palpation. Assessment [name], [name] Active Problems ICD-10 E11.622 - Type 2 diabetes mellitus with other skin ulcer E11.610 - Type 2 diabetes mellitus with diabetic neuropathic arthropathy I87.331 - Chronic venous hypertension (idiopathic) with ulcer and inflammation of right lower  extremity S41.112A - Laceration without foreign body of left upper arm, initial encounter Plan Wound Cleansing: Wound #1 Right,Medial Lower Leg: Cleanse wound with mild soap and water Anesthetic: Wound #1 Right,Medial Lower Leg: Topical Lidocaine 4% cream applied to wound bed prior to debridement Primary Wound Dressing: Wound #1 Right,Medial Lower Leg: Promogran Foam - siltec to protect heel Other: - sorbact Secondary Dressing: Wound #1 Right,Medial Lower Leg: ABD pad Dressing Change Frequency: Wound #1 Right,Medial Lower Leg: Change dressing every week Follow-up Appointments: Wound #1 Right,Medial Lower Leg: Return Appointment in 1 week. Edema Control: Wound #1 Right,Medial Lower Leg: 2 Layer Compression System - Right Lower Extremity Elevate  legs to the level of the heart and pump ankles as often as possible This week we will use collagen on the wound and then,Sorbact and foam, and use 2 layer compression which is all she can tolerate. [name], [name] She is again encouraged to continue with exercise, elevation and good control of her diabetes. I have discussed with the patient that I am going to talk to the vendors to try and get her some samples of Puraply and see if this is going to help her. She is agreeable about this. Electronic Signature(s) Signed: 03/26/2015 2:27:54 PM By: Christin Fudge MD, FACS Entered By: Christin Fudge on 03/26/2015 14:27:53 [name] -------------------------------------------------------------------------------- SuperBill Details Patient Name: Kydd, Lakira C. Date of Service: 03/26/2015 Medical Record Number: [id number] Patient Account Number: [id number] Date of Birth/Sex: [birth year] (81 y.o. Female) Treating RN: Carolyne Fiscal, Debi Primary Care Physician: Ria Bush Other Clinician: Referring Physician: Ria Bush Treating Physician/Extender: Frann Rider in Treatment: 31 Diagnosis  Coding ICD-10 Codes Code Description E11.622 Type 2 diabetes mellitus with other skin ulcer E11.610 Type 2 diabetes mellitus with diabetic neuropathic arthropathy Chronic venous hypertension (idiopathic) with ulcer and inflammation of right lower I87.331 extremity S41.112A Laceration without foreign body of left upper arm, initial encounter Facility Procedures CPT4: Description Modifier Quantity Code [id number] (Facility Use Only) [phone] - APPLY Spencer RT 1 LEG Physician Procedures CPT4: Description Modifier Quantity Code [id number] - WC PHYS LEVEL 3 - EST PT 1 ICD-10 Description Diagnosis E11.622 Type 2 diabetes mellitus with other skin ulcer E11.610 Type 2 diabetes mellitus with diabetic neuropathic arthropathy I87.331 Chronic  venous hypertension (idiopathic) with ulcer and inflammation of right lower extremity S41.112A Laceration without foreign body of left upper arm, initial encounter Electronic Signature(s) Signed: 03/26/2015 5:30:06 PM By: Christin Fudge MD, FACS Signed: 03/26/2015 5:45:13 PM By: Alric Quan Previous Signature: 03/26/2015 2:28:11 PM Version By: Christin Fudge MD, FACS Entered By: Alric Quan on 03/26/2015 17:28:02

## 2015-03-27 NOTE — Progress Notes (Signed)
ARLOW, GERLEMAN ([id number]) Visit Report for 03/26/2015 Arrival Information Details Patient Name: [name], [name] Date of Service: 03/26/2015 1:30 PM Medical Record Number: [id number] Patient Account Number: [id number] Date of Birth/Sex: [birth year] (81 y.o. Female) Treating RN: Carolyne Fiscal, Debi Primary Care Physician: Ria Bush Other Clinician: Referring Physician: Ria Bush Treating Physician/Extender: Frann Rider in Treatment: 28 Visit Information History Since Last Visit All ordered tests and consults were completed: No Patient Arrived: Walker Added or deleted any medications: No Arrival Time: 13:37 Any new allergies or adverse reactions: No Accompanied By: self Had a fall or experienced change in No Transfer Assistance: None activities of daily living that may affect Patient Identification Verified: Yes risk of falls: Secondary Verification Process Yes Signs or symptoms of abuse/neglect since last No Completed: visito Patient Requires Transmission- No Hospitalized since last visit: No Based Precautions: Pain Present Now: No Patient Has Alerts: Yes Patient Alerts: Patient on Blood Thinner Type II Diabetic 81 MG aspirin Electronic Signature(s) Signed: 03/26/2015 5:45:13 PM By: Alric Quan Entered By: Alric Quan on 03/26/2015 13:37:26 [name], [name] ([id number]) -------------------------------------------------------------------------------- Encounter Discharge Information Details Patient Name: [name], [name] Date of Service: 03/26/2015 1:30 PM Medical Record Number: [id number] Patient Account Number: [id number] Date of Birth/Sex: 06-Apr-1927 (81 y.o. Female) Treating RN: Carolyne Fiscal, Debi Primary Care Physician: Ria Bush Other Clinician: Referring Physician: Ria Bush Treating Physician/Extender: Frann Rider in Treatment: 31 Encounter Discharge Information Items Discharge Pain Level: 0 Discharge  Condition: Stable Ambulatory Status: Walker Discharge Destination: Home Transportation: Private Auto Accompanied By: self Schedule Follow-up Appointment: Yes Medication Reconciliation completed and provided to Patient/Care Yes Montie Swiderski: Provided on Clinical Summary of Care: 03/26/2015 Form Type Recipient Paper Patient LL Electronic Signature(s) Signed: 03/26/2015 2:35:29 PM By: Ruthine Dose Entered By: Ruthine Dose on 03/26/2015 14:35:28 [name], [name] ([id number]) -------------------------------------------------------------------------------- Lower Extremity Assessment Details Patient Name: [name], [name] Date of Service: 03/26/2015 1:30 PM Medical Record Number: [id number] Patient Account Number: [id number] Date of Birth/Sex: 1927/09/26 (81 y.o. Female) Treating RN: Carolyne Fiscal, Debi Primary Care Physician: Ria Bush Other Clinician: Referring Physician: Ria Bush Treating Physician/Extender: Frann Rider in Treatment: 31 Edema Assessment Assessed: [Left: No] [Right: No] E[Left: dema] [Right: :] Calf Left: Right: Point of Measurement: cm From Medial Instep cm 26 cm Ankle Left: Right: Point of Measurement: cm From Medial Instep cm 19.9 cm Vascular Assessment Pulses: Posterior Tibial Dorsalis Pedis Palpable: [Right:Yes] Extremity colors, hair growth, and conditions: Extremity Color: [Right:Mottled] Hair Growth on Extremity: [Right:No] Temperature of Extremity: [Right:Warm] Capillary Refill: [Right:< 3 seconds] Toe Nail Assessment Left: Right: Thick: No Discolored: No Deformed: No Improper Length and Hygiene: No Electronic Signature(s) Signed: 03/26/2015 5:45:13 PM By: Alric Quan Entered By: Alric Quan on 03/26/2015 13:52:00 [name], [name] ([id number]) -------------------------------------------------------------------------------- Multi Wound Chart Details Patient Name: [name], [name] Date of Service:  03/26/2015 1:30 PM Medical Record Number: [id number] Patient Account Number: [id number] Date of Birth/Sex: Feb 24, 1928 (81 y.o. Female) Treating RN: Carolyne Fiscal, Debi Primary Care Physician: Ria Bush Other Clinician: Referring Physician: Ria Bush Treating Physician/Extender: Frann Rider in Treatment: 31 Vital Signs Height(in): 58 Pulse(bpm): 79 Weight(lbs): 151 Blood Pressure 143/67 (mmHg): Body Mass Index(BMI): 32 Temperature(F): 98.1 Respiratory Rate 18 (breaths/min): Photos: [1:No Photos] [N/A:N/A] Wound Location: [1:Right Lower Leg - Medial] [N/A:N/A] Wounding Event: [1:Gradually Appeared] [N/A:N/A] Primary Etiology: [1:Venous Leg Ulcer] [N/A:N/A] Comorbid History: [1:Cataracts, Anemia, Coronary Artery Disease, Hypertension, Myocardial Infarction, Type II Diabetes, Osteoarthritis] [N/A:N/A] Date Acquired: [1:04/04/2014] [N/A:N/A] Weeks of Treatment: [1:31] [N/A:N/A] Wound Status: [1:Open] [N/A:N/A] Measurements L x  W x D 4.6x0.5x0.2 [N/A:N/A] (cm) Area (cm) : [1:1.806] [N/A:N/A] Volume (cm) : [1:0.361] [N/A:N/A] % Reduction in Area: [1:69.30%] [N/A:N/A] % Reduction in Volume: 79.60% [N/A:N/A] Classification: [1:Full Thickness Without Exposed Support Structures] [N/A:N/A] HBO Classification: [1:Grade 1] [N/A:N/A] Exudate Amount: [1:Medium] [N/A:N/A] Exudate Type: [1:Serosanguineous] [N/A:N/A] Exudate Color: [1:red, brown] [N/A:N/A] Foul Odor After [1:Yes] [N/A:N/A] Cleansing: Odor Anticipated Due to No [N/A:N/A] Product Use: Wound Margin: [1:Distinct, outline attached] [N/A:N/A] Granulation Amount: Large (67-100%) N/A N/A Granulation Quality: Red, Pink N/A N/A Necrotic Amount: None Present (0%) N/A N/A Exposed Structures: Fascia: No N/A N/A Fat: No Tendon: No Muscle: No Joint: No Bone: No Limited to Skin Breakdown Epithelialization: None N/A N/A Periwound Skin Texture: Edema: Yes N/A N/A Excoriation: No Induration: No Callus:  No Crepitus: No Fluctuance: No Friable: No Rash: No Scarring: No Periwound Skin Moist: Yes N/A N/A Moisture: Maceration: No Dry/Scaly: No Periwound Skin Color: Erythema: Yes N/A N/A Mottled: Yes Atrophie Blanche: No Cyanosis: No Ecchymosis: No Hemosiderin Staining: No Pallor: No Rubor: No Erythema Location: Circumferential N/A N/A Temperature: No Abnormality N/A N/A Tenderness on Yes N/A N/A Palpation: Wound Preparation: Ulcer Cleansing: Other: N/A N/A soap and water Topical Anesthetic Applied: Other: lidocaine 4% Treatment Notes Electronic Signature(s) Signed: 03/26/2015 5:45:13 PM By: Alric Quan Entered By: Alric Quan on 03/26/2015 13:54:53 [name], [name] ([id number]) [name], [name] ([id number]) -------------------------------------------------------------------------------- [place] Details Patient Name: [name], [name] Date of Service: 03/26/2015 1:30 PM Medical Record Number: [id number] Patient Account Number: [id number] Date of Birth/Sex: 1927-08-22 (82 y.o. Female) Treating RN: Carolyne Fiscal, Debi Primary Care Physician: Ria Bush Other Clinician: Referring Physician: Ria Bush Treating Physician/Extender: Frann Rider in Treatment: 71 Active Inactive Abuse / Safety / Falls / Self Care Management Nursing Diagnoses: Potential for falls Goals: Patient will remain injury free Date Initiated: 08/15/2014 Goal Status: Active Interventions: Assess fall risk on admission and as needed Notes: Nutrition Nursing Diagnoses: Impaired glucose control: actual or potential Goals: Patient/caregiver verbalizes understanding of need to maintain therapeutic glucose control per primary care physician Date Initiated: 08/15/2014 Goal Status: Active Interventions: Assess patient nutrition upon admission and as needed per policy Notes: Orientation to the Wound Care Program Nursing Diagnoses: Knowledge deficit  related to the wound healing center program Goals: Patient/caregiver will verbalize understanding of the [place], Shamra [name]. ([id number]) Date Initiated: 08/15/2014 Goal Status: Active Interventions: Provide education on orientation to the wound center Notes: Venous Leg Ulcer Nursing Diagnoses: Actual venous Insuffiency (use after diagnosis is confirmed) Goals: Patient will maintain optimal edema control Date Initiated: 08/15/2014 Goal Status: Active Interventions: Compression as ordered Treatment Activities: Test ordered outside of clinic : 03/26/2015 Notes: Wound/Skin Impairment Nursing Diagnoses: Impaired tissue integrity Goals: Patient/caregiver will verbalize understanding of skin care regimen Date Initiated: 08/15/2014 Goal Status: Active Interventions: Assess patient/caregiver ability to obtain necessary supplies Notes: Electronic Signature(s) Signed: 03/26/2015 5:45:13 PM By: Alric Quan Entered By: Alric Quan on 03/26/2015 13:54:46 [name], [name] ([id number]) -------------------------------------------------------------------------------- Pain Assessment Details Patient Name: Maietta, Davonna [name]. Date of Service: 03/26/2015 1:30 PM Medical Record Number: [id number] Patient Account Number: [id number] Date of Birth/Sex: [birth year] (81 y.o. Female) Treating RN: Carolyne Fiscal, Debi Primary Care Physician: Ria Bush Other Clinician: Referring Physician: Ria Bush Treating Physician/Extender: Frann Rider in Treatment: 31 Active Problems Location of Pain Severity and Description of Pain Patient Has [name] Yes Site Locations Pain Location: Generalized Pain Duration of the Pain. Constant / Intermittento Constant Rate the pain. Current Pain Level: 8 Character of Pain Describe the  Pain: Aching Pain Management and Medication Current Pain Management: Electronic Signature(s) Signed: 03/26/2015 5:45:13 PM By:  Alric Quan Entered By: Alric Quan on 03/26/2015 13:37:40 [name], [name] ([id number]) -------------------------------------------------------------------------------- Patient/Caregiver Education Details Patient Name: [name], [name] Date of Service: 03/26/2015 1:30 PM Medical Record Number: [id number] Patient Account Number: [id number] Date of Birth/Gender: 1927/10/25 (81 y.o. Female) Treating RN: Carolyne Fiscal, Debi Primary Care Physician: Ria Bush Other Clinician: Referring Physician: Ria Bush Treating Physician/Extender: Frann Rider in Treatment: 31 Education Assessment Education Provided To: Patient Education Topics Provided Wound/Skin Impairment: Handouts: Other: do not get wrap wet Methods: Demonstration, Explain/Verbal Responses: State content correctly Electronic Signature(s) Signed: 03/26/2015 5:45:13 PM By: Alric Quan Entered By: Alric Quan on 03/26/2015 14:17:26 [name], [name] ([id number]) -------------------------------------------------------------------------------- Wound Assessment Details Patient Name: [name], [name] Date of Service: 03/26/2015 1:30 PM Medical Record Number: [id number] Patient Account Number: [id number] Date of Birth/Sex: Nov 15, 1927 (81 y.o. Female) Treating RN: Carolyne Fiscal, Debi Primary Care Physician: Ria Bush Other Clinician: Referring Physician: Ria Bush Treating Physician/Extender: Frann Rider in Treatment: 31 Wound Status Wound Number: 1 Primary Venous Leg Ulcer Etiology: Wound Location: Right Lower Leg - Medial Wound Open Wounding Event: Gradually Appeared Status: Date Acquired: 04/04/2014 Comorbid Cataracts, Anemia, Coronary Artery Weeks Of Treatment: 31 History: Disease, Hypertension, Myocardial Clustered Wound: No Infarction, Type II Diabetes, Osteoarthritis Photos Photo Uploaded By: Alric Quan on 03/26/2015 16:54:27 Wound  Measurements Length: (cm) 4.6 Width: (cm) 0.5 Depth: (cm) 0.2 Area: (cm) 1.806 Volume: (cm) 0.361 % Reduction in Area: 69.3% % Reduction in Volume: 79.6% Epithelialization: None Tunneling: No Undermining: No Wound Description Full Thickness Without Exposed Foul Odor Af Classification: Support Structures Due to Produ Diabetic Severity Grade 1 (Wagner): Wound Margin: Distinct, outline attached Exudate Amount: Medium Exudate Type: Serosanguineous Exudate Color: red, brown ter Cleansing: Yes ct Use: No Wound Bed [name], [name] ([id number]) Granulation Amount: Large (67-100%) Exposed Structure Granulation Quality: Red, Pink Fascia Exposed: No Necrotic Amount: None Present (0%) Fat Layer Exposed: No Tendon Exposed: No Muscle Exposed: No Joint Exposed: No Bone Exposed: No Limited to Skin Breakdown Periwound Skin Texture Texture Color No Abnormalities Noted: No No Abnormalities Noted: No Callus: No Atrophie Blanche: No Crepitus: No Cyanosis: No Excoriation: No Ecchymosis: No Fluctuance: No Erythema: Yes Friable: No Erythema Location: Circumferential Induration: No Hemosiderin Staining: No Localized Edema: Yes Mottled: Yes Rash: No Pallor: No Scarring: No Rubor: No Moisture Temperature / Pain No Abnormalities Noted: No Temperature: No Abnormality Dry / Scaly: No Tenderness on Palpation: Yes Maceration: No Moist: Yes Wound Preparation Ulcer Cleansing: Other: soap and water, Topical Anesthetic Applied: Other: lidocaine 4%, Treatment Notes Wound #1 (Right, Medial Lower Leg) 1. Cleansed with: Clean wound with Normal Saline 2. Anesthetic Topical Lidocaine 4% cream to wound bed prior to debridement 4. Dressing Applied: Promogran Other dressing (specify in notes) 5. Secondary Dressing Applied ABD Pad Foam 7. Secured with 2 Layer Compression System - Right Lower Extremity Notes [name], [name] ([id number]) sorbact , siltec to  heel Electronic Signature(s) Signed: 03/26/2015 5:45:13 PM By: Alric Quan Entered By: Alric Quan on 03/26/2015 13:52:54 [name], [name] ([id number]) -------------------------------------------------------------------------------- Vitals Details Patient Name: [name], [name] Date of Service: 03/26/2015 1:30 PM Medical Record Number: [id number] Patient Account Number: [id number] Date of Birth/Sex: [birth year] (81 y.o. Female) Treating RN: Carolyne Fiscal, Debi Primary Care Physician: Ria Bush Other Clinician: Referring Physician: Ria Bush Treating Physician/Extender: Frann Rider in Treatment: 31 Vital Signs Time Taken: 13:37 Temperature (F): 98.1 Height (in): 58 Pulse (bpm): 79 Weight (  lbs): 151 Respiratory Rate (breaths/min): 18 Body Mass Index (BMI): 31.6 Blood Pressure (mmHg): 143/67 Reference Range: 80 - 120 mg / dl Electronic Signature(s) Signed: 03/26/2015 5:45:13 PM By: Alric Quan Entered By: Alric Quan on 03/26/2015 13:39:54

## 2015-04-02 ENCOUNTER — Encounter (HOSPITAL_BASED_OUTPATIENT_CLINIC_OR_DEPARTMENT_OTHER): Payer: Medicare Other | Admitting: General Surgery

## 2015-04-02 ENCOUNTER — Other Ambulatory Visit: Payer: Self-pay

## 2015-04-02 DIAGNOSIS — I83212 Varicose veins of right lower extremity with both ulcer of calf and inflammation: Secondary | ICD-10-CM | POA: Diagnosis not present

## 2015-04-02 DIAGNOSIS — E11610 Type 2 diabetes mellitus with diabetic neuropathic arthropathy: Secondary | ICD-10-CM | POA: Diagnosis not present

## 2015-04-02 DIAGNOSIS — E11622 Type 2 diabetes mellitus with other skin ulcer: Secondary | ICD-10-CM | POA: Diagnosis not present

## 2015-04-02 DIAGNOSIS — S41112A Laceration without foreign body of left upper arm, initial encounter: Secondary | ICD-10-CM | POA: Diagnosis not present

## 2015-04-02 DIAGNOSIS — L97211 Non-pressure chronic ulcer of right calf limited to breakdown of skin: Secondary | ICD-10-CM | POA: Diagnosis not present

## 2015-04-02 DIAGNOSIS — I872 Venous insufficiency (chronic) (peripheral): Secondary | ICD-10-CM | POA: Diagnosis not present

## 2015-04-02 DIAGNOSIS — I2510 Atherosclerotic heart disease of native coronary artery without angina pectoris: Secondary | ICD-10-CM | POA: Diagnosis not present

## 2015-04-02 DIAGNOSIS — L97219 Non-pressure chronic ulcer of right calf with unspecified severity: Principal | ICD-10-CM

## 2015-04-02 DIAGNOSIS — I87331 Chronic venous hypertension (idiopathic) with ulcer and inflammation of right lower extremity: Secondary | ICD-10-CM | POA: Diagnosis not present

## 2015-04-02 DIAGNOSIS — E1151 Type 2 diabetes mellitus with diabetic peripheral angiopathy without gangrene: Secondary | ICD-10-CM | POA: Diagnosis not present

## 2015-04-02 DIAGNOSIS — I10 Essential (primary) hypertension: Secondary | ICD-10-CM | POA: Diagnosis not present

## 2015-04-02 NOTE — Telephone Encounter (Signed)
Pt left v/m requesting rx hydrocodone apap. Call when ready for pick up. rx last printed # 60 on 03/05/2015, last saw Dr Darnell Level on 09/02/14.

## 2015-04-02 NOTE — Progress Notes (Signed)
seeiheal 

## 2015-04-03 MED ORDER — HYDROCODONE-ACETAMINOPHEN 5-325 MG PO TABS: 0.5000 | ORAL_TABLET | Freq: Four times a day (QID) | ORAL | Status: DC | PRN

## 2015-04-03 NOTE — Telephone Encounter (Signed)
Printed and in Kim's box 

## 2015-04-03 NOTE — Progress Notes (Addendum)
[name], [name] ([id number]) Visit Report for 04/02/2015 Arrival Information Details Patient Name: [name], [name] Date of Service: 04/02/2015 3:30 PM Medical Record Number: [id number] Patient Account Number: [id number] Date of Birth/Sex: [birth year] (81 y.o. Female) Treating RN: Carolyne Fiscal, Debi Primary Care Physician: Ria Bush Other Clinician: Referring Physician: Ria Bush Treating Physician/Extender: Benjaman Pott in Treatment: 33 Visit Information History Since Last Visit All ordered tests and consults were completed: No Patient Arrived: Walker Added or deleted any medications: No Arrival Time: 15:20 Any new allergies or adverse reactions: No Accompanied By: self Had a fall or experienced change in No Transfer Assistance: None activities of daily living that may affect Patient Identification Verified: Yes risk of falls: Secondary Verification Process Yes Signs or symptoms of abuse/neglect since last No Completed: visito Patient Requires Transmission- No Hospitalized since last visit: No Based Precautions: Pain Present Now: No Patient Has Alerts: Yes Patient Alerts: Patient on Blood Thinner Type II Diabetic 81 MG aspirin Electronic Signature(s) Signed: 04/02/2015 4:28:27 PM By: Alric Quan Entered By: Alric Quan on 04/02/2015 15:20:25 Norenberg, [name] ([id number]) -------------------------------------------------------------------------------- Encounter Discharge Information Details Patient Name: [name], Josslin C. Date of Service: 04/02/2015 3:30 PM Medical Record Number: [id number] Patient Account Number: [id number] Date of Birth/Sex: 08/16/1927 (81 y.o. Female) Treating RN: Carolyne Fiscal, Debi Primary Care Physician: Ria Bush Other Clinician: Referring Physician: Ria Bush Treating Physician/Extender: Benjaman Pott in Treatment: 32 Encounter Discharge Information Items Discharge Pain Level: 0 Discharge  Condition: Stable Ambulatory Status: Walker Discharge Destination: Home Transportation: Private Auto Accompanied By: self Schedule Follow-up Appointment: Yes Medication Reconciliation completed and provided to Patient/Care Yes Tristian Sickinger: Provided on Clinical Summary of Care: 04/02/2015 Form Type Recipient Paper Patient LL Electronic Signature(s) Signed: 04/03/2015 10:52:27 AM By: Judene Companion MD Previous Signature: 04/02/2015 4:00:21 PM Version By: Sharon Mt Entered By: Judene Companion on 04/03/2015 10:52:27 Kopplin, [name] ([id number]) -------------------------------------------------------------------------------- Lower Extremity Assessment Details Patient Name: Bovey, Letti C. Date of Service: 04/02/2015 3:30 PM Medical Record Number: [id number] Patient Account Number: [id number] Date of Birth/Sex: 22-Mar-1928 (81 y.o. Female) Treating RN: Carolyne Fiscal, Debi Primary Care Physician: Ria Bush Other Clinician: Referring Physician: Ria Bush Treating Physician/Extender: Benjaman Pott in Treatment: 32 Edema Assessment Assessed: [Left: No] [Right: No] E[Left: dema] [Right: :] Calf Left: Right: Point of Measurement: 27 cm From Medial Instep cm 26.4 cm Ankle Left: Right: Point of Measurement: 10 cm From Medial Instep cm 19 cm Vascular Assessment Pulses: Posterior Tibial Dorsalis Pedis Palpable: [Right:Yes] Extremity colors, hair growth, and conditions: Extremity Color: [Right:Hyperpigmented] Hair Growth on Extremity: [Right:No] Temperature of Extremity: [Right:Warm] Capillary Refill: [Right:< 3 seconds] Toe Nail Assessment Left: Right: Thick: Yes Discolored: Yes Deformed: Yes Improper Length and Hygiene: No Electronic Signature(s) Signed: 04/02/2015 4:28:27 PM By: Alric Quan Entered By: Alric Quan on 04/02/2015 15:33:48 Addis, Silver CMarland Kitchen  ([id number]) -------------------------------------------------------------------------------- Multi Wound Chart Details Patient Name: [name], Sheriann C. Date of Service: 04/02/2015 3:30 PM Medical Record Number: [id number] Patient Account Number: [id number] Date of Birth/Sex: 1928/01/14 (81 y.o. Female) Treating RN: Carolyne Fiscal, Debi Primary Care Physician: Ria Bush Other Clinician: Referring Physician: Ria Bush Treating Physician/Extender: Benjaman Pott in Treatment: 32 Vital Signs Height(in): 58 Pulse(bpm): 72 Weight(lbs): 151 Blood Pressure 125/61 (mmHg): Body Mass Index(BMI): 32 Temperature(F): 98.2 Respiratory Rate 18 (breaths/min): Photos: [1:No Photos] [N/A:N/A] Wound Location: [1:Right Lower Leg - Medial] [N/A:N/A] Wounding Event: [1:Gradually Appeared] [N/A:N/A] Primary Etiology: [1:Venous Leg Ulcer] [N/A:N/A] Comorbid History: [1:Cataracts, Anemia, Coronary Artery Disease, Hypertension, Myocardial Infarction, Type II Diabetes, Osteoarthritis] [N/A:N/A] Date Acquired: [1:04/04/2014] [N/A:N/A]  Weeks of Treatment: [1:32] [N/A:N/A] Wound Status: [1:Open] [N/A:N/A] Measurements L x W x D 4.7x1x0.2 [N/A:N/A] (cm) Area (cm) : [1:3.691] [N/A:N/A] Volume (cm) : [1:0.738] [N/A:N/A] % Reduction in Area: [1:37.30%] [N/A:N/A] % Reduction in Volume: 58.20% [N/A:N/A] Classification: [1:Full Thickness Without Exposed Support Structures] [N/A:N/A] HBO Classification: [1:Grade 1] [N/A:N/A] Exudate Amount: [1:Medium] [N/A:N/A] Exudate Type: [1:Serosanguineous] [N/A:N/A] Exudate Color: [1:red, brown] [N/A:N/A] Wound Margin: [1:Distinct, outline attached] [N/A:N/A] Granulation Amount: [1:Large (67-100%)] [N/A:N/A] Granulation Quality: [1:Red, Pink] [N/A:N/A] Necrotic Amount: [1:None Present (0%)] [N/A:N/A] Exposed Structures: [N/A:N/A] Fascia: No Fat: No Tendon: No Muscle: No Joint: No Bone: No Limited to Skin Breakdown Epithelialization: None N/A  N/A Periwound Skin Texture: Edema: Yes N/A N/A Excoriation: No Induration: No Callus: No Crepitus: No Fluctuance: No Friable: No Rash: No Scarring: No Periwound Skin Moist: Yes N/A N/A Moisture: Maceration: No Dry/Scaly: No Periwound Skin Color: Erythema: Yes N/A N/A Mottled: Yes Atrophie Blanche: No Cyanosis: No Ecchymosis: No Hemosiderin Staining: No Pallor: No Rubor: No Erythema Location: Circumferential N/A N/A Temperature: No Abnormality N/A N/A Tenderness on Yes N/A N/A Palpation: Wound Preparation: Ulcer Cleansing: Other: N/A N/A soap and water Topical Anesthetic Applied: Other: lidocaine 4% Treatment Notes Electronic Signature(s) Signed: 04/02/2015 4:28:27 PM By: Alric Quan Entered By: Alric Quan on 04/02/2015 15:35:56 Glance, [name] ([id number]) -------------------------------------------------------------------------------- [place] Details Patient Name: Faw, Brenee C. Date of Service: 04/02/2015 3:30 PM Medical Record Number: [id number] Patient Account Number: [id number] Date of Birth/Sex: 1927-06-16 (81 y.o. Female) Treating RN: Carolyne Fiscal, Debi Primary Care Physician: Ria Bush Other Clinician: Referring Physician: Ria Bush Treating Physician/Extender: Benjaman Pott in Treatment: 98 Active Inactive Abuse / Safety / Falls / Self Care Management Nursing Diagnoses: Potential for falls Goals: Patient will remain injury free Date Initiated: 08/15/2014 Goal Status: Active Interventions: Assess fall risk on admission and as needed Notes: Nutrition Nursing Diagnoses: Impaired glucose control: actual or potential Goals: Patient/caregiver verbalizes understanding of need to maintain therapeutic glucose control per primary care physician Date Initiated: 08/15/2014 Goal Status: Active Interventions: Assess patient nutrition upon admission and as needed per policy Notes: Orientation to the  Wound Care Program Nursing Diagnoses: Knowledge deficit related to the wound healing center program Goals: Patient/caregiver will verbalize understanding of the [place], Esty C. ([id number]) Date Initiated: 08/15/2014 Goal Status: Active Interventions: Provide education on orientation to the wound center Notes: Venous Leg Ulcer Nursing Diagnoses: Actual venous Insuffiency (use after diagnosis is confirmed) Goals: Patient will maintain optimal edema control Date Initiated: 08/15/2014 Goal Status: Active Interventions: Compression as ordered Treatment Activities: Test ordered outside of clinic : 04/02/2015 Notes: Wound/Skin Impairment Nursing Diagnoses: Impaired tissue integrity Goals: Patient/caregiver will verbalize understanding of skin care regimen Date Initiated: 08/15/2014 Goal Status: Active Interventions: Assess patient/caregiver ability to obtain necessary supplies Notes: Electronic Signature(s) Signed: 04/02/2015 4:28:27 PM By: Alric Quan Entered By: Alric Quan on 04/02/2015 15:35:49 Sproule, Noorah C. ([id number]) -------------------------------------------------------------------------------- Pain Assessment Details Patient Name: Hostetler, Terianne C. Date of Service: 04/02/2015 3:30 PM Medical Record Number: [id number] Patient Account Number: [id number] Date of Birth/Sex: 09/05/1927 (81 y.o. Female) Treating RN: Carolyne Fiscal, Debi Primary Care Physician: Ria Bush Other Clinician: Referring Physician: Ria Bush Treating Physician/Extender: Benjaman Pott in Treatment: 32 Active Problems Location of Pain Severity and Description of Pain Patient Has [name] No Site Locations Pain Management and Medication Current Pain Management: Electronic Signature(s) Signed: 04/02/2015 4:28:27 PM By: Alric Quan Entered By: Alric Quan on 04/02/2015 15:20:31 Bulnes, [name]  ([id number]) -------------------------------------------------------------------------------- Patient/Caregiver Education Details Patient Name:  Faxon, Malani C. Date of Service: 04/02/2015 3:30 PM Medical Record Number: [id number] Patient Account Number: [id number] Date of Birth/Gender: 1927-10-26 (81 y.o. Female) Treating RN: Carolyne Fiscal, Debi Primary Care Physician: Ria Bush Other Clinician: Referring Physician: Ria Bush Treating Physician/Extender: Benjaman Pott in Treatment: 57 Education Assessment Education Provided To: Patient Education Topics Provided Wound/Skin Impairment: Handouts: Other: do not get wrap wet Methods: Demonstration, Explain/Verbal Responses: State content correctly Electronic Signature(s) Signed: 04/03/2015 10:52:38 AM By: Judene Companion MD Previous Signature: 04/02/2015 4:28:27 PM Version By: Alric Quan Entered By: Judene Companion on 04/03/2015 10:52:38 Hickel, [name] ([id number]) -------------------------------------------------------------------------------- Wound Assessment Details Patient Name: Kissoon, Koral C. Date of Service: 04/02/2015 3:30 PM Medical Record Number: [id number] Patient Account Number: [id number] Date of Birth/Sex: April 23, 1927 (81 y.o. Female) Treating RN: Carolyne Fiscal, Debi Primary Care Physician: Ria Bush Other Clinician: Referring Physician: Ria Bush Treating Physician/Extender: Benjaman Pott in Treatment: 32 Wound Status Wound Number: 1 Primary Venous Leg Ulcer Etiology: Wound Location: Right Lower Leg - Medial Wound Open Wounding Event: Gradually Appeared Status: Date Acquired: 04/04/2014 Comorbid Cataracts, Anemia, Coronary Artery Weeks Of Treatment: 32 History: Disease, Hypertension, Myocardial Clustered Wound: No Infarction, Type II Diabetes, Osteoarthritis Photos Photo Uploaded By: Alric Quan on 04/02/2015 16:21:23 Wound Measurements Length: (cm)  4.7 Width: (cm) 1 Depth: (cm) 0.2 Area: (cm) 3.691 Volume: (cm) 0.738 % Reduction in Area: 37.3% % Reduction in Volume: 58.2% Epithelialization: None Tunneling: No Undermining: No Wound Description Full Thickness Without Exposed Foul Odor Af Classification: Support Structures Diabetic Severity Grade 1 (Wagner): Wound Margin: Distinct, outline attached Exudate Amount: Medium Exudate Type: Serosanguineous Exudate Color: red, brown ter Cleansing: No Wound Bed Oxendine, Leeyah C. ([id number]) Granulation Amount: Large (67-100%) Exposed Structure Granulation Quality: Red, Pink Fascia Exposed: No Necrotic Amount: None Present (0%) Fat Layer Exposed: No Tendon Exposed: No Muscle Exposed: No Joint Exposed: No Bone Exposed: No Limited to Skin Breakdown Periwound Skin Texture Texture Color No Abnormalities Noted: No No Abnormalities Noted: No Callus: No Atrophie Blanche: No Crepitus: No Cyanosis: No Excoriation: No Ecchymosis: No Fluctuance: No Erythema: Yes Friable: No Erythema Location: Circumferential Induration: No Hemosiderin Staining: No Localized Edema: Yes Mottled: Yes Rash: No Pallor: No Scarring: No Rubor: No Moisture Temperature / Pain No Abnormalities Noted: No Temperature: No Abnormality Dry / Scaly: No Tenderness on Palpation: Yes Maceration: No Moist: Yes Wound Preparation Ulcer Cleansing: Other: soap and water, Topical Anesthetic Applied: Other: lidocaine 4%, Treatment Notes Wound #1 (Right, Medial Lower Leg) 1. Cleansed with: Clean wound with Normal Saline 2. Anesthetic Topical Lidocaine 4% cream to wound bed prior to debridement 4. Dressing Applied: Promogran Other dressing (specify in notes) 5. Secondary Dressing Applied ABD Pad 7. Secured with 2 Layer Compression System - Right Lower Extremity Notes sorbact , siltec to heel Faciane, ANNALYNNE FIUME ([id number]) Electronic Signature(s) Signed: 04/02/2015 4:28:27 PM By:  Alric Quan Entered By: Alric Quan on 04/02/2015 15:34:53 Ritthaler, [name] ([id number]) -------------------------------------------------------------------------------- Vitals Details Patient Name: Sager, Keziyah C. Date of Service: 04/02/2015 3:30 PM Medical Record Number: [id number] Patient Account Number: [id number] Date of Birth/Sex: Dec 20, 1927 (81 y.o. Female) Treating RN: Carolyne Fiscal, Debi Primary Care Physician: Ria Bush Other Clinician: Referring Physician: Ria Bush Treating Physician/Extender: Benjaman Pott in Treatment: 32 Vital Signs Time Taken: 15:20 Temperature (F): 98.2 Height (in): 58 Pulse (bpm): 72 Weight (lbs): 151 Respiratory Rate (breaths/min): 18 Body Mass Index (BMI): 31.6 Blood Pressure (mmHg): 125/61 Reference Range: 80 - 120 mg / dl Electronic Signature(s) Signed: 04/02/2015 4:28:27 PM By: Alric Quan Entered  By: Alric Quan on 04/02/2015 15:32:20

## 2015-04-03 NOTE — Progress Notes (Addendum)
[name], [name] ([id number]) Visit Report for 04/02/2015 Physician Orders Details Patient Name: [name], [name] Date of Service: 04/02/2015 3:30 PM Medical Record Number: [id number] Patient Account Number: [id number] Date of Birth/Sex: March 20, 1928 (81 y.o. Female) Treating RN: Carolyne Fiscal, Debi Primary Care Physician: Ria Bush Other Clinician: Referring Physician: Ria Bush Treating Physician/Extender: Benjaman Pott in Treatment: 31 Verbal / Phone Orders: Yes Clinician: Carolyne Fiscal, Debi Read Back and Verified: Yes Diagnosis Coding Wound Cleansing Wound #1 Right,Medial Lower Leg o Cleanse wound with mild soap and water Anesthetic Wound #1 Right,Medial Lower Leg o Topical Lidocaine 4% cream applied to wound bed prior to debridement Primary Wound Dressing Wound #1 Right,Medial Lower Leg o Promogran o Foam - siltec to protect heel o Other: - sorbact Secondary Dressing Wound #1 Right,Medial Lower Leg o ABD pad Dressing Change Frequency Wound #1 Right,Medial Lower Leg o Change dressing every week Follow-up Appointments Wound #1 Right,Medial Lower Leg o Return Appointment in 1 week. Edema Control Wound #1 Right,Medial Lower Leg o 2 Layer Compression System - Right Lower Extremity o Elevate legs to the level of the heart and pump ankles as often as possible [name], [name] ([id number]) Electronic Signature(s) Signed: 04/02/2015 4:28:27 PM By: Alric Quan Signed: 04/03/2015 8:01:58 AM By: Judene Companion MD Entered By: Alric Quan on 04/02/2015 15:41:56 [name], [name] ([id number]) -------------------------------------------------------------------------------- Problem List Details Patient Name: [name], [name] Date of Service: 04/02/2015 3:30 PM Medical Record Number: [id number] Patient Account Number: [id number] Date of Birth/Sex: [birth year] (81 y.o. Female) Treating RN: Carolyne Fiscal, Debi Primary Care Physician:  Ria Bush Other Clinician: Referring Physician: Ria Bush Treating Physician/Extender: Benjaman Pott in Treatment: 32 Active Problems ICD-10 Encounter Code Description Active Date Diagnosis E11.622 Type 2 diabetes mellitus with other skin ulcer 08/15/2014 Yes E11.610 Type 2 diabetes mellitus with diabetic neuropathic 08/15/2014 Yes arthropathy I87.331 Chronic venous hypertension (idiopathic) with ulcer and 08/15/2014 Yes inflammation of right lower extremity S41.112A Laceration without foreign body of left upper arm, initial 12/12/2014 Yes encounter Inactive Problems Resolved Problems ICD-10 Code Description Active Date Resolved Date L03.115 Cellulitis of right lower limb 08/29/2014 08/29/2014 Electronic Signature(s) Signed: 04/03/2015 10:50:51 AM By: Judene Companion MD Entered By: Judene Companion on 04/03/2015 10:50:50 [name], [name] ([id number]) -------------------------------------------------------------------------------- [place] Details Patient Name: [name], [name] Date of Service: 04/02/2015 Medical Record Number: [id number] Patient Account Number: [id number] Date of Birth/Sex: 1928-01-07 (81 y.o. Female) Treating RN: Carolyne Fiscal, Debi Primary Care Physician: Ria Bush Other Clinician: Referring Physician: Ria Bush Treating Physician/Extender: Benjaman Pott in Treatment: 32 Diagnosis Coding ICD-10 Codes Code Description E11.622 Type 2 diabetes mellitus with other skin ulcer E11.610 Type 2 diabetes mellitus with diabetic neuropathic arthropathy Chronic venous hypertension (idiopathic) with ulcer and inflammation of right lower I87.331 extremity S41.112A Laceration without foreign body of left upper arm, initial encounter Facility Procedures CPT4: Description Modifier Quantity Code [id number] (Facility Use Only) [phone] - [place] RT 1 LEG Physician Procedures CPT4: Description Modifier Quantity Code [id number] - WC PHYS LEVEL 2 - EST PT 1 ICD-10 Description Diagnosis I87.331 Chronic venous hypertension (idiopathic) with ulcer and inflammation of right lower extremity Electronic Signature(s) Signed: 04/03/2015 10:51:56 AM By: Judene Companion MD Previous Signature: 04/02/2015 4:28:27 PM Version By: Alric Quan Previous Signature: 04/03/2015 8:01:58 AM Version By: Judene Companion MD Entered By: Judene Companion on 04/03/2015 10:51:56

## 2015-04-03 NOTE — Telephone Encounter (Signed)
Rx left in front office for pick up and pt is aware  

## 2015-04-10 ENCOUNTER — Encounter: Payer: Medicare Other | Attending: Surgery | Admitting: Surgery

## 2015-04-10 DIAGNOSIS — X58XXXA Exposure to other specified factors, initial encounter: Secondary | ICD-10-CM | POA: Insufficient documentation

## 2015-04-10 DIAGNOSIS — I87331 Chronic venous hypertension (idiopathic) with ulcer and inflammation of right lower extremity: Secondary | ICD-10-CM | POA: Insufficient documentation

## 2015-04-10 DIAGNOSIS — L97211 Non-pressure chronic ulcer of right calf limited to breakdown of skin: Secondary | ICD-10-CM | POA: Insufficient documentation

## 2015-04-10 DIAGNOSIS — E11610 Type 2 diabetes mellitus with diabetic neuropathic arthropathy: Secondary | ICD-10-CM | POA: Insufficient documentation

## 2015-04-10 DIAGNOSIS — E11622 Type 2 diabetes mellitus with other skin ulcer: Secondary | ICD-10-CM | POA: Diagnosis not present

## 2015-04-10 DIAGNOSIS — I10 Essential (primary) hypertension: Secondary | ICD-10-CM | POA: Insufficient documentation

## 2015-04-10 DIAGNOSIS — S41112A Laceration without foreign body of left upper arm, initial encounter: Secondary | ICD-10-CM | POA: Insufficient documentation

## 2015-04-11 NOTE — Progress Notes (Signed)
NZINGHA, PIRAINO ([id number]) Visit Report for 04/10/2015 Chief Complaint Document Details Patient Name: [name], [name] Date of Service: 04/10/2015 3:00 PM Medical Record Number: [id number] Patient Account Number: [id number] Date of Birth/Sex: [birth year] (82 y.o. Female) Treating RN: Cornell Barman Primary Care Physician: Ria Bush Other Clinician: Referring Physician: Ria Bush Treating Physician/Extender: Frann Rider in Treatment: 54 Information Obtained from: Patient Chief Complaint Patient presents for treatment of an open ulcer due to venous insufficiency. 82 year old patient who is known to me from the [facility] wound care center now comes in follow up here for a chronic venous ulceration of her right lower extremity which she's had for about 6 months. Electronic Signature(s) Signed: 04/10/2015 4:23:53 PM By: Christin Fudge MD, FACS Entered By: Christin Fudge on 04/10/2015 16:23:53 [name], [name] -------------------------------------------------------------------------------- Cellular or Tissue Based Product Details Patient Name: Lamore, Mouna C. Date of Service: 04/10/2015 3:00 PM Medical Record Number: [id number] Patient Account Number: [id number] Date of Birth/Sex: [birth year] (82 y.o. Female) Treating RN: Cornell Barman Primary Care Physician: Ria Bush Other Clinician: Referring Physician: Ria Bush Treating Physician/Extender: Frann Rider in Treatment: 34 Cellular or Tissue Based Wound #1 Right,Medial Lower Leg Product Type Applied to: Performed By: Physician Christin Fudge, MD Cellular or Tissue Based Other Product Type: Time-Out Taken: Yes Location: trunk / arms / legs Wound Size (sq cm): 3.15 Product Size (sq cm): 25 Waste Size (sq cm): 21 Waste Reason: wound size Amount of Product Applied (sq cm): 4 Lot #: [id number] Expiration Date: 12/17/2016 Fenestrated: No Reconstituted: Yes Solution Type:  nacl Solution Amount: 64ml Lot #: [id number] Solution Expiration 08/02/2016 Date: Secured: Yes Secured With: Steri-Strips Dressing Applied: Yes Primary Dressing: mepitel one Procedural Pain: 0 Post Procedural Pain: 0 Response to Treatment: Procedure was tolerated well Post Procedure Diagnosis Same as Pre-procedure Electronic Signature(s) Signed: 04/10/2015 5:51:05 PM By: Gretta Cool, RN, BSN, Kim RN, BSN Previous Signature: 04/10/2015 4:23:47 PM Version By: Christin Fudge MD, FACS Entered By: Gretta Cool RN, BSN, Kim on 04/10/2015 16:54:18 [name], [name] [name], [name] Kitchen ([id number]) -------------------------------------------------------------------------------- HPI Details Patient Name: Urbanczyk, Kyna C. Date of Service: 04/10/2015 3:00 PM Medical Record Number: [id number] Patient Account Number: [id number] Date of Birth/Sex: Nov 12, 1927 (82 y.o. Female) Treating RN: Cornell Barman Primary Care Physician: Ria Bush Other Clinician: Referring Physician: Ria Bush Treating Physician/Extender: Frann Rider in Treatment: 34 History of Present Illness Location: ulcer in the right lower extremity for several months Quality: Patient reports experiencing a dull pain to affected area(s). Severity: Patient states wound (s) are getting better. Duration: Patient has had the wound for > 3 months prior to seeking treatment at the wound center Timing: Pain in wound is Intermittent (comes and goes Context: The wound appeared gradually over time Modifying Factors: Consults to this date include: vascular surgery for the veins in December 2015 and she's been seeing the wound care center at [facility] for several months. Associated Signs and Symptoms: Patient reports having difficulty standing for long periods. HPI Description: This is an 82 yrs old female we have been following since the beginning of January for a wound on the right medial ankle. She had previously had venous ablation  of the greater saphenous vein by Dr. Kellie Simmering. She came to [place] with erythema of the right leg and subsequently is opened in the medial aspect of the right ankle. She has not been very compliant with compression. We ordered her at juxta lite stocking, however she apparently has not been able to wear it. The area on  the medial aspect of her left ankle has much less induration and erythema. The edema is better controlled. No evidence of infection is noted. 07/18/14; no major change in the condition of this wound. There is still surface slough and probably some drainage. 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 All other veins showed no evidence of DVT, superficial venous thrombosis or incompetence. however she does have manifestations of chronic venous hypertension and I believe we will change her dressing over to Prisma and a Profore light dressing. she sees her primary care doctor regularly and her last hemoglobin A1c has been around 7%.her other comorbidities are hypertension, CABG in 1998, peptic ulcer disease, history of echo with a ejection fraction of 55-60% and grade 1 diastolic dysfunction. past surgical history include coronary artery bypass graft in 1998, cholecystectomy, appendectomy, hand surgery, endovenous ablation of the saphenous vein with laser by Dr. Kellie Simmering in December 2015. 08/29/2014 - she complains of increased calf pain over the past week. removed her compression bandage and pain improved. no fever or chills. Mild clear drainage. 09/04/2014 -- she has not been compliant and has not done a dressing change as we had ordered last week. She always complains and does not keep her wrap on and this past week she has done the same. She has completed a course of doxycycline. 09/25/2014 -- she has tolerated her dressing all week and has not removed it. She says it's  hurting a lot today and would not like any debridement. We will do so as per her wishes. she did get a appointment with the vascular surgeon and sees him on 6/28. CORLENE, WISECARVER ([id number]) 10/03/2014 - She was seen by Dr. Tinnie Gens 09/30/2014. Assessment: I reviewed the previous venous duplex exam study performed in September 2015 which reveals no options for treating this venous ulcer. There is no significant superficial venous reflux to treat with laser ablation.Most recent ABI was 0.91 in the right foot with palpable dorsalis pedis pulse at 3+ Plan: patient needs to return to the [place] wound center and be treated by Dr. Con Memos. No arterial or venous options available from vascular standpoint or treatment of right leg stasis ulcer.Continued dressing changes and compression is the treatment of choice 10/16/2014 -- today will be her first application of Apligraf. 10/23/2014 -- we are going to be able to apply her second day of Apligraf next week. She is complaining of some tenderness in the region of Achilles tendon and this is possibly due to her wrap. 11/06/2014 -- she did not have Apligraf last week because the wound was not looking good but she says she is feeling much better now. 11/13/2014 -- she has been doing well this week and she is here for her second Apligraf. 11/27/2014 -- she is here for her third application of Apligraf 12/12/2014 -- she is here for her fourth application of Apligraf. Also during these past 3 days she had an injury to her left forearm which she caught against the edge of her desk and has a lacerated wound which has gone down to the subcutaneous tissue. 12/26/2014 -- she is here for a last/fifth application of Apligraf. 04/10/2015 -- the patient is here for her first application of Puraply. Electronic Signature(s) Signed: 04/10/2015 4:25:37 PM By: Christin Fudge MD, FACS Entered By: Christin Fudge on 04/10/2015 16:25:37 [name], [name]  ([id number]) -------------------------------------------------------------------------------- Physical  Exam Details Patient Name: [name], [name] Date of Service: 04/10/2015 3:00 PM Medical Record Number: [id number] Patient Account Number: [id number] Date of Birth/Sex: Jul 04, 1927 (82 y.o. Female) Treating RN: Cornell Barman Primary Care Physician: Ria Bush Other Clinician: Referring Physician: Ria Bush Treating Physician/Extender: Frann Rider in Treatment: 34 Constitutional . Pulse regular. Respirations normal and unlabored. Afebrile. . Eyes Nonicteric. Reactive to light. Ears, Nose, Mouth, and Throat Lips, teeth, and gums WNL.Marland Kitchen Moist mucosa without lesions. Neck supple and nontender. No palpable supraclavicular or cervical adenopathy. Normal sized without goiter. Respiratory WNL. No retractions.. Breath sounds WNL, No rubs, rales, rhonchi, or wheeze.. Cardiovascular Heart rhythm and rate regular, no murmur or gallop.. Pedal Pulses WNL. No clubbing, cyanosis or edema. Lymphatic No adneopathy. No adenopathy. No adenopathy. Musculoskeletal Adexa without tenderness or enlargement.. Digits and nails w/o clubbing, cyanosis, infection, petechiae, ischemia, or inflammatory conditions.. Integumentary (Hair, Skin) No suspicious lesions. No crepitus or fluctuance. No peri-wound warmth or erythema. No masses.Marland Kitchen Psychiatric Judgement and insight Intact.. No evidence of depression, anxiety, or agitation.. Notes the ulcer base in the left lower extremity is looking excellent and a Marez bit of sharp debridement was done and the first application of Puraply was applied in the usual fashion. Electronic Signature(s) Signed: 04/10/2015 4:26:25 PM By: Christin Fudge MD, FACS Entered By: Christin Fudge on 04/10/2015 [id number] Ruvalcaba, [name] -------------------------------------------------------------------------------- Physician Orders Details Patient Name:  [name], [name] Date of Service: 04/10/2015 3:00 PM Medical Record Number: [id number] Patient Account Number: [id number] Date of Birth/Sex: 1927/06/01 (82 y.o. Female) Treating RN: Cornell Barman Primary Care Physician: Ria Bush Other Clinician: Referring Physician: Ria Bush Treating Physician/Extender: Frann Rider in Treatment: 73 Verbal / Phone Orders: Yes Clinician: Cornell Barman Read Back and Verified: Yes Diagnosis Coding Wound Cleansing Wound #1 Right,Medial Lower Leg o Cleanse wound with mild soap and water Anesthetic Wound #1 Right,Medial Lower Leg o Topical Lidocaine 4% cream applied to wound bed prior to debridement Secondary Dressing Wound #1 Right,Medial Lower Leg o ABD pad Dressing Change Frequency Wound #1 Right,Medial Lower Leg o Change dressing every week Follow-up Appointments Wound #1 Right,Medial Lower Leg o Return Appointment in 1 week. Edema Control Wound #1 Right,Medial Lower Leg o 2 Layer Compression System - Right Lower Extremity o Elevate legs to the level of the heart and pump ankles as often as possible Notes Puraply application in clinic; including contact layer, fixation with steri strips, dry gauze and cover dressing. Electronic Signature(s) Signed: 04/10/2015 5:05:49 PM By: Christin Fudge MD, FACS Signed: 04/10/2015 5:51:05 PM By: Gretta Cool RN, BSN, Kim RN, BSN Entered By: Gretta Cool, RN, BSN, Kim on 04/10/2015 15:43:56 [place], [name] -------------------------------------------------------------------------------- Problem List Details Patient Name: [name], [name] Date of Service: 04/10/2015 3:00 PM Medical Record Number: [id number] Patient Account Number: [id number] Date of Birth/Sex: 27-May-1927 (82 y.o. Female) Treating RN: Cornell Barman Primary Care Physician: Ria Bush Other Clinician: Referring Physician: Ria Bush Treating Physician/Extender: Frann Rider in Treatment:  23 Active Problems ICD-10 Encounter Code Description Active Date Diagnosis E11.622 Type 2 diabetes mellitus with other skin ulcer 08/15/2014 Yes E11.610 Type 2 diabetes mellitus with diabetic neuropathic 08/15/2014 Yes arthropathy I87.331 Chronic venous hypertension (idiopathic) with ulcer and 08/15/2014 Yes inflammation of right lower extremity S41.112A Laceration without foreign body of left upper arm, initial 12/12/2014 Yes encounter Inactive Problems Resolved Problems ICD-10 Code Description Active Date Resolved Date L03.115 Cellulitis of right lower limb 08/29/2014 08/29/2014 Electronic Signature(s) Signed: 04/10/2015 4:23:39 PM By: Christin Fudge MD, FACS Entered  By: Christin Fudge on 04/10/2015 16:23:39 [name], [name] ([id number]) -------------------------------------------------------------------------------- Progress Note Details Patient Name: [name], [name] Date of Service: 04/10/2015 3:00 PM Medical Record Number: [id number] Patient Account Number: [id number] Date of Birth/Sex: Apr 03, 1928 (82 y.o. Female) Treating RN: Cornell Barman Primary Care Physician: Ria Bush Other Clinician: Referring Physician: Ria Bush Treating Physician/Extender: Frann Rider in Treatment: 82 Subjective Chief Complaint Information obtained from Patient Patient presents for treatment of an open ulcer due to venous insufficiency. 82 year old patient who is known to me from the [facility] wound care center now comes in follow up here for a chronic venous ulceration of her right lower extremity which she's had for about 6 months. History of Present Illness (HPI) The following HPI elements were documented for the patient's wound: Location: ulcer in the right lower extremity for several months Quality: Patient reports experiencing a dull pain to affected area(s). Severity: Patient states wound (s) are getting better. Duration: Patient has had the wound for > 3 months prior to  seeking treatment at the wound center Timing: Pain in wound is Intermittent (comes and goes Context: The wound appeared gradually over time Modifying Factors: Consults to this date include: vascular surgery for the veins in December 2015 and she's been seeing the wound care center at [facility] for several months. Associated Signs and Symptoms: Patient reports having difficulty standing for long periods. This is an 82 yrs old female we have been following since the beginning of January for a wound on the right medial ankle. She had previously had venous ablation of the greater saphenous vein by Dr. Kellie Simmering. She came to [place] with erythema of the right leg and subsequently is opened in the medial aspect of the right ankle. She has not been very compliant with compression. We ordered her at juxta lite stocking, however she apparently has not been able to wear it. The area on the medial aspect of her left ankle has much less induration and erythema. The edema is better controlled. No evidence of infection is noted. 07/18/14; no major change in the condition of this wound. There is still surface slough and probably some drainage. 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 All other veins showed no evidence of DVT, superficial venous thrombosis or incompetence. however she does have manifestations of chronic venous hypertension and I believe we will change her dressing over to Prisma and a Profore light dressing. she sees her primary care doctor regularly and her last hemoglobin A1c has been around 7%.her other comorbidities are hypertension, CABG in 1998, peptic ulcer disease, history of echo with a ejection fraction of 55-60% and grade 1 diastolic dysfunction. past surgical history include coronary artery bypass graft in 1998, cholecystectomy, appendectomy, hand surgery, endovenous  ablation of the saphenous vein with laser by Dr. Kellie Simmering in December 2015. [name], [name] ([id number]) 08/29/2014 - she complains of increased calf pain over the past week. removed her compression bandage and pain improved. no fever or chills. Mild clear drainage. 09/04/2014 -- she has not been compliant and has not done a dressing change as we had ordered last week. She always complains and does not keep her wrap on and this past week she has done the same. She has completed a course of doxycycline. 09/25/2014 -- she has tolerated her dressing all week and has not removed it. She says it's hurting a lot  today and would not like any debridement. We will do so as per her wishes. she did get a appointment with the vascular surgeon and sees him on 6/28. 10/03/2014 - She was seen by Dr. Tinnie Gens 09/30/2014. Assessment: I reviewed the previous venous duplex exam study performed in September 2015 which reveals no options for treating this venous ulcer. There is no significant superficial venous reflux to treat with laser ablation.Most recent ABI was 0.91 in the right foot with palpable dorsalis pedis pulse at 3+ Plan: patient needs to return to the [place] wound center and be treated by Dr. Con Memos. No arterial or venous options available from vascular standpoint or treatment of right leg stasis ulcer.Continued dressing changes and compression is the treatment of choice 10/16/2014 -- today will be her first application of Apligraf. 10/23/2014 -- we are going to be able to apply her second day of Apligraf next week. She is complaining of some tenderness in the region of Achilles tendon and this is possibly due to her wrap. 11/06/2014 -- she did not have Apligraf last week because the wound was not looking good but she says she is feeling much better now. 11/13/2014 -- she has been doing well this week and she is here for her second Apligraf. 11/27/2014 -- she is here for her third application  of Apligraf 12/12/2014 -- she is here for her fourth application of Apligraf. Also during these past 3 days she had an injury to her left forearm which she caught against the edge of her desk and has a lacerated wound which has gone down to the subcutaneous tissue. 12/26/2014 -- she is here for a last/fifth application of Apligraf. 04/10/2015 -- the patient is here for her first application of Puraply. Objective Constitutional Pulse regular. Respirations normal and unlabored. Afebrile. Vitals Time Taken: 3:04 PM, Height: 58 in, Weight: 151 lbs, BMI: 31.6, Temperature: 97.9 F, Pulse: 75 bpm, Respiratory Rate: 18 breaths/min, Blood Pressure: 167/67 mmHg. Eyes Nonicteric. Reactive to light. [name], Ceria C. ([id number]) Ears, Nose, Mouth, and Throat Lips, teeth, and gums WNL.Marland Kitchen Moist mucosa without lesions. Neck supple and nontender. No palpable supraclavicular or cervical adenopathy. Normal sized without goiter. Respiratory WNL. No retractions.. Breath sounds WNL, No rubs, rales, rhonchi, or wheeze.. Cardiovascular Heart rhythm and rate regular, no murmur or gallop.. Pedal Pulses WNL. No clubbing, cyanosis or edema. Lymphatic No adneopathy. No adenopathy. No adenopathy. Musculoskeletal Adexa without tenderness or enlargement.. Digits and nails w/o clubbing, cyanosis, infection, petechiae, ischemia, or inflammatory conditions.Marland Kitchen Psychiatric Judgement and insight Intact.. No evidence of depression, anxiety, or agitation.. General Notes: the ulcer base in the left lower extremity is looking excellent and a Holmer bit of sharp debridement was done and the first application of Puraply was applied in the usual fashion. Integumentary (Hair, Skin) No suspicious lesions. No crepitus or fluctuance. No peri-wound warmth or erythema. No masses.. Wound #1 status is Open. Original cause of wound was Gradually Appeared. The wound is located on the Right,Medial Lower Leg. The wound measures 3.5cm  length x 0.9cm width x 0.2cm depth; 2.474cm^2 area and 0.495cm^3 volume. The wound is limited to skin breakdown. There is a medium amount of serosanguineous drainage noted. The wound margin is distinct with the outline attached to the wound base. There is large (67-100%) red, pink granulation within the wound bed. There is no necrotic tissue within the wound bed. The periwound skin appearance exhibited: Localized Edema, Moist, Mottled, Erythema. The periwound skin appearance did not exhibit: Callus, Crepitus, Excoriation, Fluctuance,  Friable, Induration, Rash, Scarring, Dry/Scaly, Maceration, Atrophie Blanche, Cyanosis, Ecchymosis, Hemosiderin Staining, Pallor, Rubor. The surrounding wound skin color is noted with erythema which is circumferential. Periwound temperature was noted as No Abnormality. The periwound has tenderness on palpation. Assessment Active Problems ICD-10 E11.622 - Type 2 diabetes mellitus with other skin ulcer E11.610 - Type 2 diabetes mellitus with diabetic neuropathic arthropathy Kinoshita, Emslee C. ([id number]) I87.331 - Chronic venous hypertension (idiopathic) with ulcer and inflammation of right lower extremity S41.112A - Laceration without foreign body of left upper arm, initial encounter Procedures Wound #1 Wound #1 is a Venous Leg Ulcer located on the Right,Medial Lower Leg. A skin graft procedure using a bioengineered skin substitute/cellular or tissue based product was performed by Christin Fudge, MD. Other was applied and secured with Steri-Strips. 4 sq cm of product was utilized and 21 sq cm was wasted due to wound size. Post Application, mepitel one was applied. A Time Out was conducted prior to the start of the procedure. The procedure was tolerated well with a pain level of 0 throughout and a pain level of 0 following the procedure. Post procedure Diagnosis Wound #1: Same as Pre-Procedure . Plan Wound Cleansing: Wound #1 Right,Medial Lower Leg: Cleanse  wound with mild soap and water Anesthetic: Wound #1 Right,Medial Lower Leg: Topical Lidocaine 4% cream applied to wound bed prior to debridement Secondary Dressing: Wound #1 Right,Medial Lower Leg: ABD pad Dressing Change Frequency: Wound #1 Right,Medial Lower Leg: Change dressing every week Follow-up Appointments: Wound #1 Right,Medial Lower Leg: Return Appointment in 1 week. Edema Control: Wound #1 Right,Medial Lower Leg: 2 Layer Compression System - Right Lower Extremity Elevate legs to the level of the heart and pump ankles as often as possible General Notes: Puraply application in clinic; including contact layer, fixation with steri strips, dry gauze and cover dressing. Hagwood, Kimbley C. ([id number]) she has had her first application of Puraply applied today followed by a 2 layer compression and we will see her back next week. Electronic Signature(s) Signed: 04/10/2015 5:05:36 PM By: Christin Fudge MD, FACS Previous Signature: 04/10/2015 5:05:26 PM Version By: Christin Fudge MD, FACS Previous Signature: 04/10/2015 4:27:00 PM Version By: Christin Fudge MD, FACS Entered By: Christin Fudge on 04/10/2015 17:05:36 Gasbarro, [name] ([id number]) -------------------------------------------------------------------------------- SuperBill Details Patient Name: Maxfield, Teddi C. Date of Service: 04/10/2015 Medical Record Number: [id number] Patient Account Number: [id number] Date of Birth/Sex: Mar 26, 1928 (82 y.o. Female) Treating RN: Cornell Barman Primary Care Physician: Ria Bush Other Clinician: Referring Physician: Ria Bush Treating Physician/Extender: Frann Rider in Treatment: 34 Diagnosis Coding ICD-10 Codes Code Description E11.622 Type 2 diabetes mellitus with other skin ulcer E11.610 Type 2 diabetes mellitus with diabetic neuropathic arthropathy Chronic venous hypertension (idiopathic) with ulcer and inflammation of right lower I87.331 extremity S41.112A  Laceration without foreign body of left upper arm, initial encounter Facility Procedures CPT4: Description Modifier Quantity Code [id number] - SKIN SUB GRAFT TRNK/ARM/LEG 1 ICD-10 Description Diagnosis E11.622 Type 2 diabetes mellitus with other skin ulcer E11.610 Type 2 diabetes mellitus with diabetic neuropathic arthropathy I87.331  Chronic venous hypertension (idiopathic) with ulcer and inflammation of right lower extremity Physician Procedures CPT4: Description Modifier Quantity Code [id number] - WC PHYS SKIN SUB GRAFT TRNK/ARM/LEG 1 ICD-10 Description Diagnosis E11.622 Type 2 diabetes mellitus with other skin ulcer E11.610 Type 2 diabetes mellitus with diabetic neuropathic arthropathy  I87.331 Chronic venous hypertension (idiopathic) with ulcer and inflammation of right lower extremity Electronic Signature(s) Signed: 04/10/2015 4:27:12 PM By: Christin Fudge MD, FACS Entered By:  Christin Fudge on 04/10/2015 16:27:12

## 2015-04-11 NOTE — Progress Notes (Signed)
RHAYA, BUENING ([id number]) Visit Report for 04/10/2015 Arrival Information Details Patient Name: Cardenas, Rashawn C. Date of Service: 04/10/2015 3:00 PM Medical Record Number: [id number] Patient Account Number: [id number] Date of Birth/Sex: May 18, 1927 (82 y.o. Female) Treating RN: Cornell Barman Primary Care Physician: Ria Bush Other Clinician: Referring Physician: Ria Bush Treating Physician/Extender: Frann Rider in Treatment: 65 Visit Information History Since Last Visit Added or deleted any medications: No Patient Arrived: Walker Any new allergies or adverse reactions: No Arrival Time: 15:03 Had a fall or experienced change in No Accompanied By: self activities of daily living that may affect Transfer Assistance: Manual risk of falls: Patient Identification Verified: Yes Signs or symptoms of abuse/neglect since last No Secondary Verification Process Yes visito Completed: Hospitalized since last visit: No Patient Requires Transmission- No Has Dressing in Place as Prescribed: Yes Based Precautions: Has Compression in Place as Prescribed: Yes Patient Has Alerts: Yes Pain Present Now: No Patient Alerts: Patient on Blood Thinner Type II Diabetic 81 MG aspirin Electronic Signature(s) Signed: 04/10/2015 5:51:05 PM By: Gretta Cool, RN, BSN, Kim RN, BSN Entered By: Gretta Cool, RN, BSN, Kim on 04/10/2015 15:04:00 Haire, [name] ([id number]) -------------------------------------------------------------------------------- Encounter Discharge Information Details Patient Name: Bolander, Beonca C. Date of Service: 04/10/2015 3:00 PM Medical Record Number: [id number] Patient Account Number: [id number] Date of Birth/Sex: Apr 19, 1927 (82 y.o. Female) Treating RN: Cornell Barman Primary Care Physician: Ria Bush Other Clinician: Referring Physician: Ria Bush Treating Physician/Extender: Frann Rider in Treatment: 47 Encounter Discharge Information  Items Discharge Pain Level: 0 Discharge Condition: Stable Ambulatory Status: Walker Discharge Destination: Home Transportation: Private Auto Accompanied By: son Schedule Follow-up Appointment: Yes Medication Reconciliation completed Yes and provided to Patient/Care Ivelise Castillo: Provided on Clinical Summary of Care: 04/10/2015 Form Type Recipient Paper Patient LL Electronic Signature(s) Signed: 04/10/2015 5:51:05 PM By: Gretta Cool RN, BSN, Kim RN, BSN Previous Signature: 04/10/2015 3:40:08 PM Version By: Ruthine Dose Entered By: Gretta Cool RN, BSN, Kim on 04/10/2015 15:45:25 Dodds, [name] ([id number]) -------------------------------------------------------------------------------- Lower Extremity Assessment Details Patient Name: Chenette, Nazaret C. Date of Service: 04/10/2015 3:00 PM Medical Record Number: [id number] Patient Account Number: [id number] Date of Birth/Sex: 1928-03-28 (82 y.o. Female) Treating RN: Cornell Barman Primary Care Physician: Ria Bush Other Clinician: Referring Physician: Ria Bush Treating Physician/Extender: Frann Rider in Treatment: 34 Edema Assessment Assessed: [Left: No] [Right: No] E[Left: dema] [Right: :] Calf Left: Right: Point of Measurement: cm From Medial Instep cm 25.8 cm Ankle Left: Right: Point of Measurement: 10 cm From Medial Instep cm 19.5 cm Vascular Assessment Pulses: Posterior Tibial Dorsalis Pedis Palpable: [Right:Yes] Extremity colors, hair growth, and conditions: Extremity Color: [Right:Normal] Hair Growth on Extremity: [Right:No] Temperature of Extremity: [Right:Warm] Capillary Refill: [Right:< 3 seconds] Toe Nail Assessment Left: Right: Thick: No Discolored: No Deformed: No Improper Length and Hygiene: No Electronic Signature(s) Signed: 04/10/2015 5:51:05 PM By: Gretta Cool, RN, BSN, Kim RN, BSN Entered By: Gretta Cool, RN, BSN, Kim on 04/10/2015 15:13:38 Nitta, [name]  ([id number]) -------------------------------------------------------------------------------- Multi Wound Chart Details Patient Name: Handrich, Estelita C. Date of Service: 04/10/2015 3:00 PM Medical Record Number: [id number] Patient Account Number: [id number] Date of Birth/Sex: September 20, 1927 (82 y.o. Female) Treating RN: Cornell Barman Primary Care Physician: Ria Bush Other Clinician: Referring Physician: Ria Bush Treating Physician/Extender: Frann Rider in Treatment: 34 Vital Signs Height(in): 58 Pulse(bpm): 75 Weight(lbs): 151 Blood Pressure 167/67 (mmHg): Body Mass Index(BMI): 32 Temperature(F): 97.9 Respiratory Rate 18 (breaths/min): Photos: [1:No Photos] [N/A:N/A] Wound Location: [1:Right Lower Leg - Medial] [N/A:N/A] Wounding Event: [1:Gradually Appeared] [N/A:N/A] Primary Etiology: [1:Venous  Leg Ulcer] [N/A:N/A] Comorbid History: [1:Cataracts, Anemia, Coronary Artery Disease, Hypertension, Myocardial Infarction, Type II Diabetes, Osteoarthritis] [N/A:N/A] Date Acquired: [1:04/04/2014] [N/A:N/A] Weeks of Treatment: [1:34] [N/A:N/A] Wound Status: [1:Open] [N/A:N/A] Measurements L x W x D 3.5x0.9x0.2 [N/A:N/A] (cm) Area (cm) : [1:2.474] [N/A:N/A] Volume (cm) : [1:0.495] [N/A:N/A] % Reduction in Area: [1:58.00%] [N/A:N/A] % Reduction in Volume: 72.00% [N/A:N/A] Classification: [1:Full Thickness Without Exposed Support Structures] [N/A:N/A] HBO Classification: [1:Grade 1] [N/A:N/A] Exudate Amount: [1:Medium] [N/A:N/A] Exudate Type: [1:Serosanguineous] [N/A:N/A] Exudate Color: [1:red, brown] [N/A:N/A] Wound Margin: [1:Distinct, outline attached] [N/A:N/A] Granulation Amount: [1:Large (67-100%)] [N/A:N/A] Granulation Quality: [1:Red, Pink] [N/A:N/A] Necrotic Amount: [1:None Present (0%)] [N/A:N/A] Exposed Structures: [N/A:N/A] Fascia: No Fat: No Tendon: No Muscle: No Joint: No Bone: No Limited to Skin Breakdown Epithelialization: None N/A  N/A Periwound Skin Texture: Edema: Yes N/A N/A Excoriation: No Induration: No Callus: No Crepitus: No Fluctuance: No Friable: No Rash: No Scarring: No Periwound Skin Moist: Yes N/A N/A Moisture: Maceration: No Dry/Scaly: No Periwound Skin Color: Erythema: Yes N/A N/A Mottled: Yes Atrophie Blanche: No Cyanosis: No Ecchymosis: No Hemosiderin Staining: No Pallor: No Rubor: No Erythema Location: Circumferential N/A N/A Temperature: No Abnormality N/A N/A Tenderness on Yes N/A N/A Palpation: Wound Preparation: Ulcer Cleansing: Other: N/A N/A soap and water Topical Anesthetic Applied: Other: lidocaine 4% Treatment Notes Electronic Signature(s) Signed: 04/10/2015 5:51:05 PM By: Gretta Cool, RN, BSN, Kim RN, BSN Entered By: Gretta Cool, RN, BSN, Kim on 04/10/2015 15:16:52 Durnil, [name] ([id number]) -------------------------------------------------------------------------------- [place] Details Patient Name: Cuffee, Susanna C. Date of Service: 04/10/2015 3:00 PM Medical Record Number: [id number] Patient Account Number: [id number] Date of Birth/Sex: 05-15-1927 (82 y.o. Female) Treating RN: Cornell Barman Primary Care Physician: Ria Bush Other Clinician: Referring Physician: Ria Bush Treating Physician/Extender: Frann Rider in Treatment: 50 Active Inactive Abuse / Safety / Falls / Self Care Management Nursing Diagnoses: Potential for falls Goals: Patient will remain injury free Date Initiated: 08/15/2014 Goal Status: Active Interventions: Assess fall risk on admission and as needed Notes: Nutrition Nursing Diagnoses: Impaired glucose control: actual or potential Goals: Patient/caregiver verbalizes understanding of need to maintain therapeutic glucose control per primary care physician Date Initiated: 08/15/2014 Goal Status: Active Interventions: Assess patient nutrition upon admission and as needed per policy Notes: Orientation to  the Wound Care Program Nursing Diagnoses: Knowledge deficit related to the wound healing center program Goals: Patient/caregiver will verbalize understanding of the [place], Talma C. ([id number]) Date Initiated: 08/15/2014 Goal Status: Active Interventions: Provide education on orientation to the wound center Notes: Venous Leg Ulcer Nursing Diagnoses: Actual venous Insuffiency (use after diagnosis is confirmed) Goals: Patient will maintain optimal edema control Date Initiated: 08/15/2014 Goal Status: Active Interventions: Compression as ordered Treatment Activities: Test ordered outside of clinic : 04/10/2015 Notes: Wound/Skin Impairment Nursing Diagnoses: Impaired tissue integrity Goals: Patient/caregiver will verbalize understanding of skin care regimen Date Initiated: 08/15/2014 Goal Status: Active Interventions: Assess patient/caregiver ability to obtain necessary supplies Notes: Electronic Signature(s) Signed: 04/10/2015 5:51:05 PM By: Gretta Cool, RN, BSN, Kim RN, BSN Entered By: Gretta Cool, RN, BSN, Kim on 04/10/2015 15:16:46 [place]ter, [name] ([id number]) -------------------------------------------------------------------------------- Pain Assessment Details Patient Name: Furnas, Destenie C. Date of Service: 04/10/2015 3:00 PM Medical Record Number: [id number] Patient Account Number: [id number] Date of Birth/Sex: [birth year] (82 y.o. Female) Treating RN: Cornell Barman Primary Care Physician: Ria Bush Other Clinician: Referring Physician: Ria Bush Treating Physician/Extender: Frann Rider in Treatment: 34 Active Problems Location of Pain Severity and Description of Pain Patient Has [name] No Site Locations  Pain Management and Medication Current Pain Management: Electronic Signature(s) Signed: 04/10/2015 5:51:05 PM By: Gretta Cool, RN, BSN, Kim RN, BSN Entered By: Gretta Cool, RN, BSN, Kim on 04/10/2015 15:04:17 [place], [name]  ([id number]) -------------------------------------------------------------------------------- Patient/Caregiver Education Details Patient Name: Mourer, Vieva C. Date of Service: 04/10/2015 3:00 PM Medical Record Number: [id number] Patient Account Number: [id number] Date of Birth/Gender: 05-03-1927 (82 y.o. Female) Treating RN: Cornell Barman Primary Care Physician: Ria Bush Other Clinician: Referring Physician: Ria Bush Treating Physician/Extender: Frann Rider in Treatment: 34 Education Assessment Education Provided To: Patient Education Topics Provided Wound/Skin Impairment: Handouts: Caring for Your Ulcer, Other: wound care as prescribed, do not remove or get dresssng wet. Methods: Demonstration Responses: State content correctly Electronic Signature(s) Signed: 04/10/2015 5:51:05 PM By: Gretta Cool, RN, BSN, Kim RN, BSN Entered By: Gretta Cool, RN, BSN, Kim on 04/10/2015 15:45:54 Lubitz, [name] ([id number]) -------------------------------------------------------------------------------- Wound Assessment Details Patient Name: Luzier, Jearlean C. Date of Service: 04/10/2015 3:00 PM Medical Record Number: [id number] Patient Account Number: [id number] Date of Birth/Sex: [birth year] (82 y.o. Female) Treating RN: Cornell Barman Primary Care Physician: Ria Bush Other Clinician: Referring Physician: Ria Bush Treating Physician/Extender: Frann Rider in Treatment: 34 Wound Status Wound Number: 1 Primary Venous Leg Ulcer Etiology: Wound Location: Right Lower Leg - Medial Wound Open Wounding Event: Gradually Appeared Status: Date Acquired: 04/04/2014 Comorbid Cataracts, Anemia, Coronary Artery Weeks Of Treatment: 34 History: Disease, Hypertension, Myocardial Clustered Wound: No Infarction, Type II Diabetes, Osteoarthritis Photos Photo Uploaded By: Gretta Cool, RN, BSN, Kim on 04/10/2015 16:59:17 Wound Measurements Length: (cm) 3.5 Width: (cm) 0.9 Depth:  (cm) 0.2 Area: (cm) 2.474 Volume: (cm) 0.495 % Reduction in Area: 58% % Reduction in Volume: 72% Epithelialization: None Wound Description Full Thickness Without Exposed Foul Odor Aft Classification: Support Structures Grade 1 Maresh, Yaeko C. ([id number]) er Cleansing: No Diabetic Severity Earleen Newport): Wound Margin: Distinct, outline attached Exudate Amount: Medium Exudate Type: Serosanguineous Exudate Color: red, brown Wound Bed Granulation Amount: Large (67-100%) Exposed Structure Granulation Quality: Red, Pink Fascia Exposed: No Necrotic Amount: None Present (0%) Fat Layer Exposed: No Tendon Exposed: No Muscle Exposed: No Joint Exposed: No Bone Exposed: No Limited to Skin Breakdown Periwound Skin Texture Texture Color No Abnormalities Noted: No No Abnormalities Noted: No Callus: No Atrophie Blanche: No Crepitus: No Cyanosis: No Excoriation: No Ecchymosis: No Fluctuance: No Erythema: Yes Friable: No Erythema Location: Circumferential Induration: No Hemosiderin Staining: No Localized Edema: Yes Mottled: Yes Rash: No Pallor: No Scarring: No Rubor: No Moisture Temperature / Pain No Abnormalities Noted: No Temperature: No Abnormality Dry / Scaly: No Tenderness on Palpation: Yes Maceration: No Moist: Yes Wound Preparation Ulcer Cleansing: Other: soap and water, Topical Anesthetic Applied: Other: lidocaine 4%, Treatment Notes Wound #1 (Right, Medial Lower Leg) 1. Cleansed with: Cleanse wound with antibacterial soap and water 2. Anesthetic Topical Lidocaine 4% cream to wound bed prior to debridement 3. Peri-wound Care: KARMIN, COLAO. ([id number]) Skin Prep 4. Dressing Applied: Other dressing (specify in notes) 7. Secured with 2 Layer Compression System - Right Lower Extremity Notes Puraply application in clinic; including contact layer, fixation with steri strips, dry gauze and cover dressing. Electronic Signature(s) Signed: 04/10/2015  5:51:05 PM By: Gretta Cool, RN, BSN, Kim RN, BSN Entered By: Gretta Cool, RN, BSN, Kim on 04/10/2015 15:12:27 Ridley, [name] ([id number]) -------------------------------------------------------------------------------- [place] Details Patient Name: Maybury, Morgane C. Date of Service: 04/10/2015 3:00 PM Medical Record Number: [id number] Patient Account Number: [id number] Date of Birth/Sex: [birth year] (82 y.o. Female) Treating RN: Cornell Barman Primary Care Physician: Ria Bush Other Clinician:  Referring Physician: Ria Bush Treating Physician/Extender: Frann Rider in Treatment: 34 Vital Signs Time Taken: 15:04 Temperature (F): 97.9 Height (in): 58 Pulse (bpm): 75 Weight (lbs): 151 Respiratory Rate (breaths/min): 18 Body Mass Index (BMI): 31.6 Blood Pressure (mmHg): 167/67 Reference Range: 80 - 120 mg / dl Electronic Signature(s) Signed: 04/10/2015 5:51:05 PM By: Gretta Cool, RN, BSN, Kim RN, BSN Entered By: Gretta Cool, RN, BSN, Kim on 04/10/2015 15:04:41

## 2015-04-13 ENCOUNTER — Ambulatory Visit (INDEPENDENT_AMBULATORY_CARE_PROVIDER_SITE_OTHER): Payer: Medicare Other | Admitting: Cardiovascular Disease

## 2015-04-13 ENCOUNTER — Encounter: Payer: Self-pay | Admitting: Cardiovascular Disease

## 2015-04-13 VITALS — BP 112/68 | HR 85 | Ht <= 58 in | Wt 152.0 lb

## 2015-04-13 DIAGNOSIS — I2510 Atherosclerotic heart disease of native coronary artery without angina pectoris: Secondary | ICD-10-CM | POA: Diagnosis not present

## 2015-04-13 LAB — COMPREHENSIVE METABOLIC PANEL
ALT: 15 U/L (ref 6–29)
AST: 20 U/L (ref 10–35)
Albumin: 3.7 g/dL (ref 3.6–5.1)
Alkaline Phosphatase: 89 U/L (ref 33–130)
BILIRUBIN TOTAL: 0.4 mg/dL (ref 0.2–1.2)
BUN: 19 mg/dL (ref 7–25)
CALCIUM: 9.2 mg/dL (ref 8.6–10.4)
CO2: 28 mmol/L (ref 20–31)
Chloride: 101 mmol/L (ref 98–110)
Creat: 0.97 mg/dL — ABNORMAL HIGH (ref 0.60–0.88)
GLUCOSE: 107 mg/dL — AB (ref 65–99)
Potassium: 4.1 mmol/L (ref 3.5–5.3)
Sodium: 140 mmol/L (ref 135–146)
Total Protein: 7 g/dL (ref 6.1–8.1)

## 2015-04-13 LAB — LIPID PANEL
CHOLESTEROL: 121 mg/dL — AB (ref 125–200)
HDL: 34 mg/dL — AB (ref >=46)
LDL Cholesterol: 65 mg/dL (ref <130)
TRIGLYCERIDES: 108 mg/dL (ref <150)
Total CHOL/HDL Ratio: 3.6 Ratio (ref <=5.0)
VLDL: 22 mg/dL (ref <30)

## 2015-04-13 NOTE — Patient Instructions (Signed)
Medication Instructions:  Your physician recommends that you continue on your current medications as directed. Please refer to the Current Medication list given to you today.   Labwork: TODAY - cholesterol, liver, basic metabolic panel  Your physician recommends that you return for lab work in: 6 months on the day of or a few days before your office visit with Dr. Acie Fredrickson.  You will need to FAST for this appointment - nothing to eat or drink after midnight the night before except water.   Testing/Procedures: None Ordered   Follow-Up: Your physician wants you to follow-up in: 6 months with Dr. Acie Fredrickson.  You will receive a reminder letter in the mail two months in advance. If you don't receive a letter, please call our office to schedule the follow-up appointment.   If you need a refill on your cardiac medications before your next appointment, please call your pharmacy.   Thank you for choosing [facility]! Christen Bame, RN [phone]

## 2015-04-13 NOTE — Progress Notes (Signed)
[place] Date of Birth  October 30, 1927 [facility] [address]    [place], [place]  [address] [phone]  Fax  [phone]   Problem list 1. Coronary artery disease-status post CABG in 1996 2. Diabetes mellitus 3. Hypertension 4. Hyperlipidemia 5. Right  DVT.   History of Present Illness:  [name] is an 82 yo female with a Hx of CAD, CABG ( November 09, 1994), diabetes mellitus, anxiety, HTN, hyperlipidemia.   She's been under a lot of stress recently. Her son died last year. She's under a lot of stress with the extra expenses involved such as his funeral. She presented with some CP in December.  A stress myoview revealed lateral ischemia.  We offered cardiac cath but she did not want to have the cath. She presents today without any particular complaints. She has not had any episodes of chest pain or shortness of breath since I saw her a month ago. She does have occasional episodes of tachycardia. She also notes that she has some anxiety. She was last seen in January after having an abnormal stress test that revealed anterior apical ischemia. She refused cardiac catheterization at that time. She has not had any recent episodes of chest pain.  She does not want to have a cardiac catheterization since she's feeling so well.  She has had some chest burning that is nearly constant.  I suggest several times that she should consider getting the cath done and she changed to subject.  September 04, 2012:  [name] is doing well from a cardiac standpoint.  She is still working - despite having lots of back pain.  No CP .  She had some CP several years ago and had a mildly abnormal myoview.  She did not want to have a cardiac cath at that time and has not had any significant epidoses of CP since that time.  She continues to be under lots of stress  She complains of back pain, stomach pain, burning in her legs.    Nov. 24, 2014:  Pt is doing OK from a cardiac standpoint.   Lots of stress - she has been running the [employer] by herself for the past several months.  She is not getting enough rest.  Sleeping 4 1/2 hours at night - tries to get a nap for an hour.    September 09, 2013:  [name] is doing ok.  She is under lots of stress as usual.    Nov. 10, 2015:  [name] presents with various aches and pains.  She has a vericose vein in her leg that bled. She is on Xarelto for a right leg DVT on Sept. 30. .   CT angio a week later was negative for PE.   She has lots of anxiety.  Has lots of chest pain.  The pain is not pleuretic.  The pain is not associated with exercise.  She does not have any pain while she is working but more often occurs with stress.   She is living with 2 elderly men now and has lots of stressful issues relating to that.    This seems to be a major cause of her pain.  She does have 80 year old SVGs  September 23, 2014:  Doing well from a cardiac standpoint. Lots of anxiety .  Has a open right heel ulcer.   Jan. 9, 2017  [name] has been doing ok No CP.  No dyspnea Has had right  ankle pain and swelling  Occasionally has faster than normal heart rate,  No associated dyspnea , CP or dizziness.  Has had an upper respiratory tract infection  Recently   Current Outpatient Prescriptions on File Prior to Visit  Medication Sig Dispense Refill  . ALPRAZolam (XANAX) 1 MG tablet Take 0.5 mg by mouth 4 (four) times daily. For anxiety    . aspirin EC 81 MG tablet Take 81 mg by mouth daily.    . Cyanocobalamin (VITAMIN B 12 PO) Take 1 tablet by mouth daily.    Marland Kitchen DEXILANT 30 MG capsule TAKE 1 CAPSULE BY MOUTH DAILY 30 capsule 5  . ferrous sulfate 325 (65 FE) MG tablet Take 1 tablet (325 mg total) by mouth daily with breakfast.    . furosemide (LASIX) 20 MG tablet TAKE 1 TABLET (20 MG TOTAL) BY MOUTH DAILY. FOR LEG SWELLING 30 tablet 6  . glucosamine-chondroitin 500-400 MG tablet Take 1 tablet by mouth daily.     Marland Kitchen glucose blood (ONE TOUCH ULTRA  TEST) test strip Use as instructed to check blood sugar once a day dx code E11.40 50 each 3  . HYDROcodone-acetaminophen (NORCO/VICODIN) 5-325 MG tablet Take 0.5-1 tablets by mouth every 6 (six) hours as needed for moderate pain. 60 tablet 0  . loperamide (IMODIUM A-D) 2 MG tablet Take 2 mg by mouth 4 (four) times daily as needed for diarrhea or loose stools.    . nitroGLYCERIN (NITROSTAT) 0.4 MG SL tablet Place 0.4 mg under the tongue every 5 (five) minutes as needed. For chest pain May take up to 3 doses, if pain persists call 911.    . nystatin-triamcinolone ointment (MYCOLOG) APPLY 1 APPLICATION TOPICALLY 2 (TWO) TIMES DAILY. AS NEEDED FOR ITCHING 60 g 1  . pioglitazone (ACTOS) 30 MG tablet Take 30 mg by mouth daily. For diabetes    . pravastatin (PRAVACHOL) 20 MG tablet TAKE 1 TABLET BY MOUTH DAILY. 30 tablet 11  . ramipril (ALTACE) 5 MG capsule TAKE ONE CAPSULE EVERYDAY FOR BLOOD PRESSURE 30 capsule 11  . rOPINIRole (REQUIP) 1 MG tablet Take 1 mg by mouth 3 (three) times daily.   0  . traZODone (DESYREL) 150 MG tablet Take 150 mg by mouth at bedtime. For sleep    . triamcinolone cream (KENALOG) 0.1 % Apply 1 application topically 2 (two) times daily. 454 g 1  . VITAMIN E PO Take 1 tablet by mouth daily.      No current facility-administered medications on file prior to visit.    Allergies  Allergen Reactions  . Amoxicillin-Pot Clavulanate Diarrhea  . Metformin And Related Diarrhea    Past Medical History  Diagnosis Date  . Hypertension   . Coronary artery disease     CABG 1998  . Anxiety   . Diastolic dysfunction   . Hyperlipidemia   . PUD (peptic ulcer disease)   . Microcytic anemia   . Depression   . Diabetes mellitus     type 2, followed by endo.  . Diverticulosis   . Hemorrhoids   . History of heart attack   . Irritable bowel syndrome   . Chronic back pain 03/2012    lumbar DDD with herniation - s/p L S1 transforaminal ESI (10/2012 Dr. Sharlet Salina)  . Varicose veins   .  Cystocele   . Hx of echocardiogram     a. Echo 10/13: mild LVH, EF 55-60%, Gr 1 diast dysfn, PASP 32  . Acute deep vein thrombosis (DVT) of distal  vein of right lower extremity ([place]) 12/2013    Acute thrombus of R proximal gastrocnemius vein. Symptomatic provoked distal DVT  . Cellulitis of leg, left 01/16/2014  . PAD (peripheral artery disease) ([place]) 2013    ABI: R 0.91, L 0.83  . Chronic venous insufficiency     Past Surgical History  Procedure Laterality Date  . Tympanoplasty  1960s    R side  . Coronary artery bypass graft  1998  . Cataract surgery    . Cholecystectomy    . Appendectomy      per pt report  . Colonoscopy  02/2010    extensive diverticulosis throughout, small internal hemorrhoids, rec rpt 5 yrs (Dr. Allyn Kenner)  . Hand surgery    . Lexiscan myoview  03/2011    Small basal inferolateral and anterolateral reversible perfusion defect suggests ischemia.  EF was normal.   old infarct, no new ischemia  . Toe surgery    . Abdominal hysterectomy  1975    TAH,BSO  . Vault prolapse a & p repair  2009    Dr [facility]  . Dexa scan  09/2011    femur -0.8, forearm 0.6 => normal  . Esi  2014    L S1 transforaminal ESI x2 ([place]) without improvement  . Abi  2013    R 0.91, L 0.83  . Endovenous ablation saphenous vein w/ laser Left 03/2014    Kellie Simmering    History  Smoking status  . Former Smoker  . Quit date: 03/05/1979  Smokeless tobacco  . Never Used    History  Alcohol Use No    Family History  Problem Relation Age of Onset  . Hypertension Mother   . Stroke Mother   . Diabetes Father   . Diabetes Sister   . Diabetes Brother   . Colon cancer Neg Hx     Reviw of Systems:  Reviewed in the HPI.  All other systems are negative.  Physical Exam: BP 112/68 mmHg  Pulse 85  Ht 4\' 10"  (1.473 m)  Wt 152 lb (68.947 kg)  BMI 31.78 kg/m2 The patient is alert and oriented x 3.  The mood and affect are normal.   Skin: warm and dry.  Color is normal.     HEENT:   Normocephalic/atraumatic. Neck is supple. Her mucous membranes are moist. The JVD. Her carotids are normal. Lungs: Lung exam is clear.    Heart: Regular rate S1-S2. She has no murmurs.   Abdomen: Abdomen soft. Extremities:  Left leg is normal Right leg has 1-2+ edema . Has a heel ulcer  Neuro:  Exam is nonfocal.    ECG:   Assessment / Plan:   1. Coronary artery disease-status post CABG in 1996 - she's doing very well. She's not had any episodes of angina. Continue current medications. 2. Diabetes mellitus 3. Hypertension -  blood pressure is  well-controlled. 4. Hyperlipidemia Will check fasting labs today   5. Right  DVT.  - She has significant edema of her right lowe leg . This is been managed by her primary medical doctor.  She has seen  Dr. Jackquline Bosch, Wonda Cheng, MD  04/13/2015 2:23 PM    [address],  [place], [place]  [address] Pager [phone] Phone: [phone]; Fax: [phone]

## 2015-04-14 ENCOUNTER — Encounter: Payer: Self-pay | Admitting: *Deleted

## 2015-04-15 ENCOUNTER — Encounter: Payer: Medicare Other | Admitting: Gynecology

## 2015-04-17 ENCOUNTER — Encounter: Payer: Medicare Other | Admitting: Surgery

## 2015-04-17 DIAGNOSIS — X58XXXA Exposure to other specified factors, initial encounter: Secondary | ICD-10-CM | POA: Diagnosis not present

## 2015-04-17 DIAGNOSIS — E11622 Type 2 diabetes mellitus with other skin ulcer: Secondary | ICD-10-CM | POA: Diagnosis not present

## 2015-04-17 DIAGNOSIS — I10 Essential (primary) hypertension: Secondary | ICD-10-CM | POA: Diagnosis not present

## 2015-04-17 DIAGNOSIS — S41112A Laceration without foreign body of left upper arm, initial encounter: Secondary | ICD-10-CM | POA: Diagnosis not present

## 2015-04-17 DIAGNOSIS — E11610 Type 2 diabetes mellitus with diabetic neuropathic arthropathy: Secondary | ICD-10-CM | POA: Diagnosis not present

## 2015-04-17 DIAGNOSIS — I87331 Chronic venous hypertension (idiopathic) with ulcer and inflammation of right lower extremity: Secondary | ICD-10-CM | POA: Diagnosis not present

## 2015-04-17 DIAGNOSIS — L97211 Non-pressure chronic ulcer of right calf limited to breakdown of skin: Secondary | ICD-10-CM | POA: Diagnosis not present

## 2015-04-18 NOTE — Progress Notes (Signed)
[name], [name] Visit Report for 04/17/2015 Chief Complaint Document Details Patient Name: [name], [name] Date of Service: 04/17/2015 1:30 PM Medical Record Number: [id number] Patient Account Number: [id number] Date of Birth/Sex: 1927/11/20 (82 y.o. Female) Treating RN: Carolyne Fiscal, Debi Primary Care Physician: Ria Bush Other Clinician: Referring Physician: Ria Bush Treating Physician/Extender: Frann Rider in Treatment: 80 Information Obtained from: Patient Chief Complaint Patient presents for treatment of an open ulcer due to venous insufficiency. 82 year old patient who is known to me from the [facility] wound care center now comes in follow up here for a chronic venous ulceration of her right lower extremity which she's had for about 6 months. Electronic Signature(s) Signed: 04/17/2015 2:17:04 PM By: Christin Fudge MD, FACS Entered By: Christin Fudge on 04/17/2015 14:17:04 [name], [name] -------------------------------------------------------------------------------- Cellular or Tissue Based Product Details Patient Name: [name], [name] Date of Service: 04/17/2015 1:30 PM Medical Record Number: [id number] Patient Account Number: [id number] Date of Birth/Sex: 29-Mar-1928 (82 y.o. Female) Treating RN: Carolyne Fiscal, Debi Primary Care Physician: Ria Bush Other Clinician: Referring Physician: Ria Bush Treating Physician/Extender: Frann Rider in Treatment: 35 Cellular or Tissue Based Wound #1 Right,Medial Lower Leg Product Type Applied to: Performed By: Physician Christin Fudge, MD Cellular or Tissue Based Other Product Type: Time-Out Taken: Yes Location: genitalia / hands / feet / multiple digits Wound Size (sq cm): 6 Product Size (sq cm): 25 Waste Size (sq cm): 19 Waste Reason: wound size Amount of Product Applied (sq cm): 6 Lot #: [id number] Expiration Date: 12/17/2016 Fenestrated:  No Reconstituted: Yes Solution Type: saline Solution Amount: 7ml Lot #: [id number] Solution Expiration 08/02/2016 Date: Secured: Yes Secured With: Steri-Strips Dressing Applied: Yes Primary Dressing: mepitel one Procedural Pain: 0 Post Procedural Pain: 0 Response to Treatment: Procedure was tolerated well Post Procedure Diagnosis Same as Pre-procedure Electronic Signature(s) Signed: 04/17/2015 2:16:21 PM By: Christin Fudge MD, FACS Entered By: Christin Fudge on 04/17/2015 14:16:21 [name], [name] -------------------------------------------------------------------------------- HPI Details Patient Name: [name], [name] Date of Service: 04/17/2015 1:30 PM Medical Record Number: [id number] Patient Account Number: [id number] Date of Birth/Sex: Aug 02, 1927 (82 y.o. Female) Treating RN: Carolyne Fiscal, Debi Primary Care Physician: Ria Bush Other Clinician: Referring Physician: Ria Bush Treating Physician/Extender: Frann Rider in Treatment: 35 History of Present Illness Location: ulcer in the right lower extremity for several months Quality: Patient reports experiencing a dull pain to affected area(s). Severity: Patient states wound (s) are getting better. Duration: Patient has had the wound for > 3 months prior to seeking treatment at the wound center Timing: Pain in wound is Intermittent (comes and goes Context: The wound appeared gradually over time Modifying Factors: Consults to this date include: vascular surgery for the veins in December 2015 and she's been seeing the wound care center at [facility] for several months. Associated Signs and Symptoms: Patient reports having difficulty standing for long periods. HPI Description: This is an 82 yrs old female we have been following since the beginning of January for a wound on the right medial ankle. She had previously had venous ablation of the greater saphenous vein by Dr. Kellie Simmering. She came to [place] with  erythema of the right leg and subsequently is opened in the medial aspect of the right ankle. She has not been very compliant with compression. We ordered her at juxta lite stocking, however she apparently has not been able to wear it. The area on the medial aspect of her left ankle has much less induration and erythema. The edema is  better controlled. No evidence of infection is noted. 07/18/14; no major change in the condition of this wound. There is still surface slough and probably some drainage. 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 All other veins showed no evidence of DVT, superficial venous thrombosis or incompetence. however she does have manifestations of chronic venous hypertension and I believe we will change her dressing over to Prisma and a Profore light dressing. she sees her primary care doctor regularly and her last hemoglobin A1c has been around 7%.her other comorbidities are hypertension, CABG in 1998, peptic ulcer disease, history of echo with a ejection fraction of 55-60% and grade 1 diastolic dysfunction. past surgical history include coronary artery bypass graft in 1998, cholecystectomy, appendectomy, hand surgery, endovenous ablation of the saphenous vein with laser by Dr. Kellie Simmering in December 2015. 08/29/2014 - she complains of increased calf pain over the past week. removed her compression bandage and pain improved. no fever or chills. Mild clear drainage. 09/04/2014 -- she has not been compliant and has not done a dressing change as we had ordered last week. She always complains and does not keep her wrap on and this past week she has done the same. She has completed a course of doxycycline. 09/25/2014 -- she has tolerated her dressing all week and has not removed it. She says it's hurting a lot today and would not like any debridement. We will do  so as per her wishes. she did get a appointment with the vascular surgeon and sees him on 6/28. [name], [name] ([id number]) 10/03/2014 - She was seen by Dr. Tinnie Gens 09/30/2014. Assessment: I reviewed the previous venous duplex exam study performed in September 2015 which reveals no options for treating this venous ulcer. There is no significant superficial venous reflux to treat with laser ablation.Most recent ABI was 0.91 in the right foot with palpable dorsalis pedis pulse at 3+ Plan: patient needs to return to the [facility] wound center and be treated by Dr. Con Memos. No arterial or venous options available from vascular standpoint or treatment of right leg stasis ulcer.Continued dressing changes and compression is the treatment of choice 10/16/2014 -- today will be her first application of Apligraf. 10/23/2014 -- we are going to be able to apply her second day of Apligraf next week. She is complaining of some tenderness in the region of Achilles tendon and this is possibly due to her wrap. 11/06/2014 -- she did not have Apligraf last week because the wound was not looking good but she says she is feeling much better now. 11/13/2014 -- she has been doing well this week and she is here for her second Apligraf. 11/27/2014 -- she is here for her third application of Apligraf 12/12/2014 -- she is here for her fourth application of Apligraf. Also during these past 3 days she had an injury to her left forearm which she caught against the edge of her desk and has a lacerated wound which has gone down to the subcutaneous tissue. 12/26/2014 -- she is here for a last/fifth application of Apligraf. 04/10/2015 -- the patient is here for her first application of Puraply. 04/17/2015 -- the patient is here for her second application of Puraply. Electronic Signature(s) Signed: 04/17/2015 2:17:41 PM By: Christin Fudge MD, FACS Entered By: Christin Fudge on 04/17/2015 14:17:41 [name], [name]  ([id number]) -------------------------------------------------------------------------------- Physical Exam Details Patient Name:  [name], [name] Date of Service: 04/17/2015 1:30 PM Medical Record Number: [id number] Patient Account Number: [id number] Date of Birth/Sex: 07-Oct-1927 (82 y.o. Female) Treating RN: Carolyne Fiscal, Debi Primary Care Physician: Ria Bush Other Clinician: Referring Physician: Ria Bush Treating Physician/Extender: Frann Rider in Treatment: 35 Constitutional . Pulse regular. Respirations normal and unlabored. Afebrile. . Eyes Nonicteric. Reactive to light. Ears, Nose, Mouth, and Throat Lips, teeth, and gums WNL.Marland Kitchen Moist mucosa without lesions. Neck supple and nontender. No palpable supraclavicular or cervical adenopathy. Normal sized without goiter. Respiratory WNL. No retractions.. Cardiovascular Pedal Pulses WNL. No clubbing, cyanosis or edema. Lymphatic No adneopathy. No adenopathy. No adenopathy. Musculoskeletal Adexa without tenderness or enlargement.. Digits and nails w/o clubbing, cyanosis, infection, petechiae, ischemia, or inflammatory conditions.. Integumentary (Hair, Skin) No suspicious lesions. No crepitus or fluctuance. No peri-wound warmth or erythema. No masses.Marland Kitchen Psychiatric Judgement and insight Intact.. No evidence of depression, anxiety, or agitation.. Notes after cleaning out the ulcer base with saline gauze all the excess collagen substitute was removed and reapplied the next Puraply with the usual precautions. Electronic Signature(s) Signed: 04/17/2015 2:18:52 PM By: Christin Fudge MD, FACS Entered By: Christin Fudge on 04/17/2015 14:18:51 Nygaard, [name] ([id number]) -------------------------------------------------------------------------------- Physician Orders Details Patient Name: [name], [name] Date of Service: 04/17/2015 1:30 PM Medical Record Number: [id number] Patient Account Number: [id number] Date  of Birth/Sex: 1927-11-03 (82 y.o. Female) Treating RN: Montey Hora Primary Care Physician: Ria Bush Other Clinician: Referring Physician: Ria Bush Treating Physician/Extender: Frann Rider in Treatment: 25 Verbal / Phone Orders: Yes Clinician: Montey Hora Read Back and Verified: Yes Diagnosis Coding Wound Cleansing Wound #1 Right,Medial Lower Leg o Cleanse wound with mild soap and water Anesthetic Wound #1 Right,Medial Lower Leg o Topical Lidocaine 4% cream applied to wound bed prior to debridement Skin Barriers/Peri-Wound Care Wound #1 Right,Medial Lower Leg o Skin Prep Primary Wound Dressing Wound #1 Right,Medial Lower Leg o Mepitel One - over PuraPly Secondary Dressing Wound #1 Right,Medial Lower Leg o ABD pad Dressing Change Frequency Wound #1 Right,Medial Lower Leg o Change dressing every week Follow-up Appointments Wound #1 Right,Medial Lower Leg o Return Appointment in 1 week. Edema Control Wound #1 Right,Medial Lower Leg o 2 Layer Compression System - Right Lower Extremity o Elevate legs to the level of the heart and pump ankles as often as possible Additional Orders / Instructions Rizor, Onia C. ([id number]) Wound #1 Right,Medial Lower Leg o Other: - PuraPly plied in clinic today by Dr Con Memos Electronic Signature(s) Signed: 04/17/2015 3:13:05 PM By: Christin Fudge MD, FACS Signed: 04/17/2015 3:21:09 PM By: Montey Hora Entered By: Montey Hora on 04/17/2015 14:13:12 [name], [name] ([id number]) -------------------------------------------------------------------------------- Problem List Details Patient Name: [name], [name] Date of Service: 04/17/2015 1:30 PM Medical Record Number: [id number] Patient Account Number: [id number] Date of Birth/Sex: 22-Aug-1927 (82 y.o. Female) Treating RN: Carolyne Fiscal, Debi Primary Care Physician: Ria Bush Other Clinician: Referring Physician: Ria Bush Treating Physician/Extender: Frann Rider in Treatment: 6 Active Problems ICD-10 Encounter Code Description Active Date Diagnosis E11.622 Type 2 diabetes mellitus with other skin ulcer 08/15/2014 Yes E11.610 Type 2 diabetes mellitus with diabetic neuropathic 08/15/2014 Yes arthropathy I87.331 Chronic venous hypertension (idiopathic) with ulcer and 08/15/2014 Yes inflammation of right lower extremity S41.112A Laceration without foreign body of left upper arm, initial 12/12/2014 Yes encounter Inactive Problems Resolved Problems ICD-10 Code Description Active Date Resolved Date L03.115 Cellulitis of right lower limb 08/29/2014 08/29/2014 Electronic Signature(s) Signed: 04/17/2015 2:16:08 PM By: Christin Fudge MD, FACS Entered By: Christin Fudge  on 04/17/2015 14:16:08 [name], [name] ([id number]) -------------------------------------------------------------------------------- Progress Note Details Patient Name: [name], [name] Date of Service: 04/17/2015 1:30 PM Medical Record Number: [id number] Patient Account Number: [id number] Date of Birth/Sex: December 05, 1927 (82 y.o. Female) Treating RN: Carolyne Fiscal, Debi Primary Care Physician: Ria Bush Other Clinician: Referring Physician: Ria Bush Treating Physician/Extender: Frann Rider in Treatment: 44 Subjective Chief Complaint Information obtained from Patient Patient presents for treatment of an open ulcer due to venous insufficiency. 82 year old patient who is known to me from the [facility] wound care center now comes in follow up here for a chronic venous ulceration of her right lower extremity which she's had for about 6 months. History of Present Illness (HPI) The following HPI elements were documented for the patient's wound: Location: ulcer in the right lower extremity for several months Quality: Patient reports experiencing a dull pain to affected area(s). Severity: Patient states wound (s) are  getting better. Duration: Patient has had the wound for > 3 months prior to seeking treatment at the wound center Timing: Pain in wound is Intermittent (comes and goes Context: The wound appeared gradually over time Modifying Factors: Consults to this date include: vascular surgery for the veins in December 2015 and she's been seeing the wound care center at [facility] for several months. Associated Signs and Symptoms: Patient reports having difficulty standing for long periods. This is an 82 yrs old female we have been following since the beginning of January for a wound on the right medial ankle. She had previously had venous ablation of the greater saphenous vein by Dr. Kellie Simmering. She came to [place] with erythema of the right leg and subsequently is opened in the medial aspect of the right ankle. She has not been very compliant with compression. We ordered her at juxta lite stocking, however she apparently has not been able to wear it. The area on the medial aspect of her left ankle has much less induration and erythema. The edema is better controlled. No evidence of infection is noted. 07/18/14; no major change in the condition of this wound. There is still surface slough and probably some drainage. 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 All other veins showed no evidence of DVT, superficial venous thrombosis or incompetence. however she does have manifestations of chronic venous hypertension and I believe we will change her dressing over to Prisma and a Profore light dressing. she sees her primary care doctor regularly and her last hemoglobin A1c has been around 7%.her other comorbidities are hypertension, CABG in 1998, peptic ulcer disease, history of echo with a ejection fraction of 55-60% and grade 1 diastolic dysfunction. past surgical history include coronary artery  bypass graft in 1998, cholecystectomy, appendectomy, hand surgery, endovenous ablation of the saphenous vein with laser by Dr. Kellie Simmering in December 2015. AIRALYN, SELIX ([id number]) 08/29/2014 - she complains of increased calf pain over the past week. removed her compression bandage and pain improved. no fever or chills. Mild clear drainage. 09/04/2014 -- she has not been compliant and has not done a dressing change as we had ordered last week. She always complains and does not keep her wrap on and this past week she has done the same. She has completed a course of doxycycline. 09/25/2014 -- she has tolerated her dressing all week and has not removed it. She says it's hurting a lot today and would  not like any debridement. We will do so as per her wishes. she did get a appointment with the vascular surgeon and sees him on 6/28. 10/03/2014 - She was seen by Dr. Tinnie Gens 09/30/2014. Assessment: I reviewed the previous venous duplex exam study performed in September 2015 which reveals no options for treating this venous ulcer. There is no significant superficial venous reflux to treat with laser ablation.Most recent ABI was 0.91 in the right foot with palpable dorsalis pedis pulse at 3+ Plan: patient needs to return to the [place] wound center and be treated by Dr. Con Memos. No arterial or venous options available from vascular standpoint or treatment of right leg stasis ulcer.Continued dressing changes and compression is the treatment of choice 10/16/2014 -- today will be her first application of Apligraf. 10/23/2014 -- we are going to be able to apply her second day of Apligraf next week. She is complaining of some tenderness in the region of Achilles tendon and this is possibly due to her wrap. 11/06/2014 -- she did not have Apligraf last week because the wound was not looking good but she says she is feeling much better now. 11/13/2014 -- she has been doing well this week and she is here  for her second Apligraf. 11/27/2014 -- she is here for her third application of Apligraf 12/12/2014 -- she is here for her fourth application of Apligraf. Also during these past 3 days she had an injury to her left forearm which she caught against the edge of her desk and has a lacerated wound which has gone down to the subcutaneous tissue. 12/26/2014 -- she is here for a last/fifth application of Apligraf. 04/10/2015 -- the patient is here for her first application of Puraply. 04/17/2015 -- the patient is here for her second application of Puraply. Objective Constitutional Pulse regular. Respirations normal and unlabored. Afebrile. Vitals Time Taken: 1:32 PM, Height: 58 in, Weight: 151 lbs, BMI: 31.6, Temperature: 97.9 F, Pulse: 83 bpm, Respiratory Rate: 20 breaths/min, Blood Pressure: 119/65 mmHg. Eyes Nonicteric. Reactive to light. Morini, Hilma C. ([id number]) Ears, Nose, Mouth, and Throat Lips, teeth, and gums WNL.Marland Kitchen Moist mucosa without lesions. Neck supple and nontender. No palpable supraclavicular or cervical adenopathy. Normal sized without goiter. Respiratory WNL. No retractions.. Cardiovascular Pedal Pulses WNL. No clubbing, cyanosis or edema. Lymphatic No adneopathy. No adenopathy. No adenopathy. Musculoskeletal Adexa without tenderness or enlargement.. Digits and nails w/o clubbing, cyanosis, infection, petechiae, ischemia, or inflammatory conditions.Marland Kitchen Psychiatric Judgement and insight Intact.. No evidence of depression, anxiety, or agitation.. General Notes: after cleaning out the ulcer base with saline gauze all the excess collagen substitute was removed and reapplied the next Puraply with the usual precautions. Integumentary (Hair, Skin) No suspicious lesions. No crepitus or fluctuance. No peri-wound warmth or erythema. No masses.. Wound #1 status is Open. Original cause of wound was Gradually Appeared. The wound is located on the Right,Medial Lower Leg. The  wound measures 5cm length x 1.2cm width x 0.2cm depth; 4.712cm^2 area and 0.942cm^3 volume. The wound is limited to skin breakdown. There is no tunneling or undermining noted. There is a medium amount of serosanguineous drainage noted. The wound margin is distinct with the outline attached to the wound base. There is large (67-100%) red, pink granulation within the wound bed. There is a small (1-33%) amount of necrotic tissue within the wound bed including Adherent Slough. The periwound skin appearance exhibited: Localized Edema, Moist, Mottled, Erythema. The periwound skin appearance did not exhibit: Callus, Crepitus, Excoriation, Fluctuance, Friable, Induration,  Rash, Scarring, Dry/Scaly, Maceration, Atrophie Blanche, Cyanosis, Ecchymosis, Hemosiderin Staining, Pallor, Rubor. The surrounding wound skin color is noted with erythema which is circumferential. Periwound temperature was noted as No Abnormality. The periwound has tenderness on palpation. Assessment Active Problems ICD-10 CARISHA, JESSE ([id number]) E11.622 - Type 2 diabetes mellitus with other skin ulcer E11.610 - Type 2 diabetes mellitus with diabetic neuropathic arthropathy I87.331 - Chronic venous hypertension (idiopathic) with ulcer and inflammation of right lower extremity S41.112A - Laceration without foreign body of left upper arm, initial encounter The second application of puraply was applied and she will have a 2 layer compression wrap. She is encouraged to continue with elevation and exercise and she'll come back and see as next week. Procedures Wound #1 Wound #1 is a Venous Leg Ulcer located on the Right,Medial Lower Leg. A skin graft procedure using a bioengineered skin substitute/cellular or tissue based product was performed by Christin Fudge, MD. Other was applied and secured with Steri-Strips. 6 sq cm of product was utilized and 19 sq cm was wasted due to wound size. Post Application, mepitel one was applied.  A Time Out was conducted prior to the start of the procedure. The procedure was tolerated well with a pain level of 0 throughout and a pain level of 0 following the procedure. Post procedure Diagnosis Wound #1: Same as Pre-Procedure . Plan Wound Cleansing: Wound #1 Right,Medial Lower Leg: Cleanse wound with mild soap and water Anesthetic: Wound #1 Right,Medial Lower Leg: Topical Lidocaine 4% cream applied to wound bed prior to debridement Skin Barriers/Peri-Wound Care: Wound #1 Right,Medial Lower Leg: Skin Prep Primary Wound Dressing: Wound #1 Right,Medial Lower Leg: Mepitel One - over PuraPly Secondary Dressing: Wound #1 Right,Medial Lower Leg: ABD pad Dressing Change Frequency: Wound #1 Right,Medial Lower Leg: Vogl, Nechelle C. ([id number]) Change dressing every week Follow-up Appointments: Wound #1 Right,Medial Lower Leg: Return Appointment in 1 week. Edema Control: Wound #1 Right,Medial Lower Leg: 2 Layer Compression System - Right Lower Extremity Elevate legs to the level of the heart and pump ankles as often as possible Additional Orders / Instructions: Wound #1 Right,Medial Lower Leg: Other: - PuraPly plied in clinic today by Dr Con Memos The second application of puraply was applied and she will have a 2 layer compression wrap. She is encouraged to continue with elevation and exercise and she'll come back and see as next week. Electronic Signature(s) Signed: 04/17/2015 2:20:00 PM By: Christin Fudge MD, FACS Entered By: Christin Fudge on 04/17/2015 14:20:00 Coste, [name] ([id number]) -------------------------------------------------------------------------------- SuperBill Details Patient Name: Presutti, Temari C. Date of Service: 04/17/2015 Medical Record Number: [id number] Patient Account Number: [id number] Date of Birth/Sex: October 07, 1927 (82 y.o. Female) Treating RN: Carolyne Fiscal, Debi Primary Care Physician: Ria Bush Other Clinician: Referring Physician:  Ria Bush Treating Physician/Extender: Frann Rider in Treatment: 35 Diagnosis Coding ICD-10 Codes Code Description E11.622 Type 2 diabetes mellitus with other skin ulcer E11.610 Type 2 diabetes mellitus with diabetic neuropathic arthropathy Chronic venous hypertension (idiopathic) with ulcer and inflammation of right lower I87.331 extremity S41.112A Laceration without foreign body of left upper arm, initial encounter Facility Procedures CPT4: Description Modifier Quantity Code [id number] - SKIN SUB GRAFT FACE/NK/HF/G 1 ICD-10 Description Diagnosis E11.622 Type 2 diabetes mellitus with other skin ulcer E11.610 Type 2 diabetes mellitus with diabetic neuropathic arthropathy I87.331  Chronic venous hypertension (idiopathic) with ulcer and inflammation of right lower extremity S41.112A Laceration without foreign body of left upper arm, initial encounter Physician Procedures CPT4: Description Modifier Quantity Code [id number] -  WC PHYS SKIN SUB GRAFT FACE/NK/HF/G 1 ICD-10 Description Diagnosis E11.622 Type 2 diabetes mellitus with other skin ulcer E11.610 Type 2 diabetes mellitus with diabetic neuropathic arthropathy  I87.331 Chronic venous hypertension (idiopathic) with ulcer and inflammation of right lower extremity S41.112A Laceration without foreign body of left upper arm, initial encounter Electronic Signature(s) Signed: 04/17/2015 2:33:38 PM By: Christin Fudge MD, FACS [place], [name] Entered By: Christin Fudge on 04/17/2015 14:33:38

## 2015-04-18 NOTE — Progress Notes (Signed)
KAHO, KUSSMAN ([id number]) Visit Report for 04/17/2015 Arrival Information Details Patient Name: [name], [name] Date of Service: 04/17/2015 1:30 PM Medical Record Number: [id number] Patient Account Number: [id number] Date of Birth/Sex: 05-27-1927 (82 y.o. Female) Treating RN: Carolyne Fiscal, Debi Primary Care Physician: Ria Bush Other Clinician: Referring Physician: Ria Bush Treating Physician/Extender: Frann Rider in Treatment: 66 Visit Information History Since Last Visit All ordered tests and consults were completed: No Patient Arrived: Walker Added or deleted any medications: No Arrival Time: 13:30 Any new allergies or adverse reactions: No Accompanied By: self Had a fall or experienced change in No Transfer Assistance: None activities of daily living that may affect Patient Identification Verified: Yes risk of falls: Secondary Verification Process Yes Signs or symptoms of abuse/neglect since last No Completed: visito Patient Requires Transmission- No Hospitalized since last visit: No Based Precautions: Pain Present Now: No Patient Has Alerts: Yes Patient Alerts: Patient on Blood Thinner Type II Diabetic 81 MG aspirin Electronic Signature(s) Signed: 04/17/2015 4:44:47 PM By: Alric Quan Entered By: Alric Quan on 04/17/2015 13:30:40 [name] -------------------------------------------------------------------------------- Encounter Discharge Information Details Patient Name: [name], [name] Date of Service: 04/17/2015 1:30 PM Medical Record Number: [id number] Patient Account Number: [id number] Date of Birth/Sex: 14-Oct-1927 (82 y.o. Female) Treating RN: Montey Hora Primary Care Physician: Ria Bush Other Clinician: Referring Physician: Ria Bush Treating Physician/Extender: Frann Rider in Treatment: 20 Encounter Discharge Information Items Discharge Pain Level: 0 Discharge  Condition: Stable Ambulatory Status: Walker Discharge Destination: Home Transportation: Private Auto Accompanied By: self Schedule Follow-up Appointment: Yes Medication Reconciliation completed No and provided to Patient/Care Avant Printy: Provided on Clinical Summary of Care: 04/17/2015 Form Type Recipient Paper Patient LL Electronic Signature(s) Signed: 04/17/2015 2:26:17 PM By: Ruthine Dose Entered By: Ruthine Dose on 04/17/2015 14:26:17 [name] -------------------------------------------------------------------------------- Lower Extremity Assessment Details Patient Name: [name], [name] Date of Service: 04/17/2015 1:30 PM Medical Record Number: [id number] Patient Account Number: [id number] Date of Birth/Sex: [birth year] (82 y.o. Female) Treating RN: Carolyne Fiscal, Debi Primary Care Physician: Ria Bush Other Clinician: Referring Physician: Ria Bush Treating Physician/Extender: Frann Rider in Treatment: 35 Edema Assessment Assessed: [Left: No] [Right: No] E[Left: dema] [Right: :] Calf Left: Right: Point of Measurement: 27 cm From Medial Instep cm 25 cm Ankle Left: Right: Point of Measurement: 10 cm From Medial Instep cm 18.9 cm Vascular Assessment Pulses: Posterior Tibial Dorsalis Pedis Palpable: [Right:Yes] Extremity colors, hair growth, and conditions: Hair Growth on Extremity: [Right:No] Temperature of Extremity: [Right:Warm] Capillary Refill: [Right:< 3 seconds] Toe Nail Assessment Left: Right: Thick: Yes Discolored: Yes Deformed: Yes Improper Length and Hygiene: Yes Electronic Signature(s) Signed: 04/17/2015 4:44:47 PM By: Alric Quan Entered By: Alric Quan on 04/17/2015 13:52:49 [name], [name] ([id number]) -------------------------------------------------------------------------------- Multi Wound Chart Details Patient Name: [name], [name] Date of Service: 04/17/2015 1:30 PM Medical Record  Number: [id number] Patient Account Number: [id number] Date of Birth/Sex: 01/13/1928 (82 y.o. Female) Treating RN: Carolyne Fiscal, Debi Primary Care Physician: Ria Bush Other Clinician: Referring Physician: Ria Bush Treating Physician/Extender: Frann Rider in Treatment: 35 Vital Signs Height(in): 58 Pulse(bpm): 83 Weight(lbs): 151 Blood Pressure 119/65 (mmHg): Body Mass Index(BMI): 32 Temperature(F): 97.9 Respiratory Rate 20 (breaths/min): Photos: [1:No Photos] [N/A:N/A] Wound Location: [1:Right Lower Leg - Medial] [N/A:N/A] Wounding Event: [1:Gradually Appeared] [N/A:N/A] Primary Etiology: [1:Venous Leg Ulcer] [N/A:N/A] Comorbid History: [1:Cataracts, Anemia, Coronary Artery Disease, Hypertension, Myocardial Infarction, Type II Diabetes, Osteoarthritis] [N/A:N/A] Date Acquired: [1:04/04/2014] [N/A:N/A] Weeks of Treatment: [1:35] [N/A:N/A] Wound Status: [1:Open] [N/A:N/A] Measurements L x W  x D 5x1.2x0.2 [N/A:N/A] (cm) Area (cm) : [1:4.712] [N/A:N/A] Volume (cm) : [1:0.942] [N/A:N/A] % Reduction in Area: [1:20.00%] [N/A:N/A] % Reduction in Volume: 46.70% [N/A:N/A] Classification: [1:Full Thickness Without Exposed Support Structures] [N/A:N/A] HBO Classification: [1:Grade 1] [N/A:N/A] Exudate Amount: [1:Medium] [N/A:N/A] Exudate Type: [1:Serosanguineous] [N/A:N/A] Exudate Color: [1:red, brown] [N/A:N/A] Wound Margin: [1:Distinct, outline attached] [N/A:N/A] Granulation Amount: [1:Large (67-100%)] [N/A:N/A] Granulation Quality: [1:Red, Pink] [N/A:N/A] Necrotic Amount: [1:Small (1-33%)] [N/A:N/A] Exposed Structures: [N/A:N/A] Fascia: No Fat: No Tendon: No Muscle: No Joint: No Bone: No Limited to Skin Breakdown Epithelialization: None N/A N/A Periwound Skin Texture: Edema: Yes N/A N/A Excoriation: No Induration: No Callus: No Crepitus: No Fluctuance: No Friable: No Rash: No Scarring: No Periwound Skin Moist: Yes N/A  N/A Moisture: Maceration: No Dry/Scaly: No Periwound Skin Color: Erythema: Yes N/A N/A Mottled: Yes Atrophie Blanche: No Cyanosis: No Ecchymosis: No Hemosiderin Staining: No Pallor: No Rubor: No Erythema Location: Circumferential N/A N/A Temperature: No Abnormality N/A N/A Tenderness on Yes N/A N/A Palpation: Wound Preparation: Ulcer Cleansing: Other: N/A N/A soap and water Topical Anesthetic Applied: Other: lidocaine 4% Treatment Notes Electronic Signature(s) Signed: 04/17/2015 4:44:47 PM By: Alric Quan Entered By: Alric Quan on 04/17/2015 13:53:03 Creps, [name] ([id number]) -------------------------------------------------------------------------------- [place] Details Patient Name: [name], [name] Date of Service: 04/17/2015 1:30 PM Medical Record Number: [id number] Patient Account Number: [id number] Date of Birth/Sex: [birth year] (82 y.o. Female) Treating RN: Carolyne Fiscal, Debi Primary Care Physician: Ria Bush Other Clinician: Referring Physician: Ria Bush Treating Physician/Extender: Frann Rider in Treatment: 46 Active Inactive Abuse / Safety / Falls / Self Care Management Nursing Diagnoses: Potential for falls Goals: Patient will remain injury free Date Initiated: 08/15/2014 Goal Status: Active Interventions: Assess fall risk on admission and as needed Notes: Nutrition Nursing Diagnoses: Impaired glucose control: actual or potential Goals: Patient/caregiver verbalizes understanding of need to maintain therapeutic glucose control per primary care physician Date Initiated: 08/15/2014 Goal Status: Active Interventions: Assess patient nutrition upon admission and as needed per policy Notes: Orientation to the Wound Care Program Nursing Diagnoses: Knowledge deficit related to the wound healing center program Goals: Patient/caregiver will verbalize understanding of the [place], Cambrie C. ([id number]) Date Initiated: 08/15/2014 Goal Status: Active Interventions: Provide education on orientation to the wound center Notes: Venous Leg Ulcer Nursing Diagnoses: Actual venous Insuffiency (use after diagnosis is confirmed) Goals: Patient will maintain optimal edema control Date Initiated: 08/15/2014 Goal Status: Active Interventions: Compression as ordered Treatment Activities: Test ordered outside of clinic : 04/17/2015 Notes: Wound/Skin Impairment Nursing Diagnoses: Impaired tissue integrity Goals: Patient/caregiver will verbalize understanding of skin care regimen Date Initiated: 08/15/2014 Goal Status: Active Interventions: Assess patient/caregiver ability to obtain necessary supplies Notes: Electronic Signature(s) Signed: 04/17/2015 4:44:47 PM By: Alric Quan Entered By: Alric Quan on 04/17/2015 13:52:56 [name], Alesia C. ([id number]) -------------------------------------------------------------------------------- Pain Assessment Details Patient Name: [name], [name] Date of Service: 04/17/2015 1:30 PM Medical Record Number: [id number] Patient Account Number: [id number] Date of Birth/Sex: November 25, 1927 (82 y.o. Female) Treating RN: Carolyne Fiscal, Debi Primary Care Physician: Ria Bush Other Clinician: Referring Physician: Ria Bush Treating Physician/Extender: Frann Rider in Treatment: 35 Active Problems Location of Pain Severity and Description of Pain Patient Has [name] No Site Locations Pain Management and Medication Current Pain Management: Electronic Signature(s) Signed: 04/17/2015 4:44:47 PM By: Alric Quan Entered By: Alric Quan on 04/17/2015 13:32:26 [name], [name] ([id number]) -------------------------------------------------------------------------------- Patient/Caregiver Education Details Patient Name: [name], [name] Date of Service: 04/17/2015 1:30 PM Medical Record  Number: [id number] Patient  Account Number: [id number] Date of Birth/Gender: [birth year] (82 y.o. Female) Treating RN: Montey Hora Primary Care Physician: Ria Bush Other Clinician: Referring Physician: Ria Bush Treating Physician/Extender: Frann Rider in Treatment: 35 Education Assessment Education Provided To: Patient Education Topics Provided Wound/Skin Impairment: Handouts: Other: puraply application Methods: Explain/Verbal Responses: State content correctly Electronic Signature(s) Signed: 04/17/2015 3:21:09 PM By: Montey Hora Entered By: Montey Hora on 04/17/2015 14:14:24 [name], [name] ([id number]) -------------------------------------------------------------------------------- Wound Assessment Details Patient Name: [name], [name] Date of Service: 04/17/2015 1:30 PM Medical Record Number: [id number] Patient Account Number: [id number] Date of Birth/Sex: Nov 08, 1927 (82 y.o. Female) Treating RN: Carolyne Fiscal, Debi Primary Care Physician: Ria Bush Other Clinician: Referring Physician: Ria Bush Treating Physician/Extender: Frann Rider in Treatment: 35 Wound Status Wound Number: 1 Primary Venous Leg Ulcer Etiology: Wound Location: Right Lower Leg - Medial Wound Open Wounding Event: Gradually Appeared Status: Date Acquired: 04/04/2014 Comorbid Cataracts, Anemia, Coronary Artery Weeks Of Treatment: 35 History: Disease, Hypertension, Myocardial Clustered Wound: No Infarction, Type II Diabetes, Osteoarthritis Photos Photo Uploaded By: Alric Quan on 04/17/2015 15:40:47 Wound Measurements Length: (cm) 5 Width: (cm) 1.2 Depth: (cm) 0.2 Area: (cm) 4.712 Volume: (cm) 0.942 % Reduction in Area: 20% % Reduction in Volume: 46.7% Epithelialization: None Tunneling: No Undermining: No Wound Description Full Thickness Without Exposed Foul Odor Af Classification: Support Structures Diabetic Severity Grade  1 (Wagner): Wound Margin: Distinct, outline attached Exudate Amount: Medium Exudate Type: Serosanguineous Exudate Color: red, brown ter Cleansing: No Wound Bed [name], [name] ([id number]) Granulation Amount: Large (67-100%) Exposed Structure Granulation Quality: Red, Pink Fascia Exposed: No Necrotic Amount: Small (1-33%) Fat Layer Exposed: No Necrotic Quality: Adherent Slough Tendon Exposed: No Muscle Exposed: No Joint Exposed: No Bone Exposed: No Limited to Skin Breakdown Periwound Skin Texture Texture Color No Abnormalities Noted: No No Abnormalities Noted: No Callus: No Atrophie Blanche: No Crepitus: No Cyanosis: No Excoriation: No Ecchymosis: No Fluctuance: No Erythema: Yes Friable: No Erythema Location: Circumferential Induration: No Hemosiderin Staining: No Localized Edema: Yes Mottled: Yes Rash: No Pallor: No Scarring: No Rubor: No Moisture Temperature / Pain No Abnormalities Noted: No Temperature: No Abnormality Dry / Scaly: No Tenderness on Palpation: Yes Maceration: No Moist: Yes Wound Preparation Ulcer Cleansing: Other: soap and water, Topical Anesthetic Applied: Other: lidocaine 4%, Treatment Notes Wound #1 (Right, Medial Lower Leg) 1. Cleansed with: Cleanse wound with antibacterial soap and water 2. Anesthetic Topical Lidocaine 4% cream to wound bed prior to debridement 3. Peri-wound Care: Skin Prep 4. Dressing Applied: Mepitel Other dressing (specify in notes) 5. Secondary Dressing Applied ABD Pad 7. Secured with 2 Layer Compression System - Right Lower Extremity [place], Bryah C. ([id number]) Notes Puraply application in clinic; including contact layer, fixation with steri strips, dry gauze and cover dressing. Electronic Signature(s) Signed: 04/17/2015 4:44:47 PM By: Alric Quan Entered By: Alric Quan on 04/17/2015 13:50:43 [place], [name]  ([id number]) -------------------------------------------------------------------------------- [place] Details Patient Name: [name], [name] Date of Service: 04/17/2015 1:30 PM Medical Record Number: [id number] Patient Account Number: [id number] Date of Birth/Sex: 1927-11-25 (82 y.o. Female) Treating RN: Carolyne Fiscal, Debi Primary Care Physician: Ria Bush Other Clinician: Referring Physician: Ria Bush Treating Physician/Extender: Frann Rider in Treatment: 35 Vital Signs Time Taken: 13:32 Temperature (F): 97.9 Height (in): 58 Pulse (bpm): 83 Weight (lbs): 151 Respiratory Rate (breaths/min): 20 Body Mass Index (BMI): 31.6 Blood Pressure (mmHg): 119/65 Reference Range: 80 - 120 mg / dl Electronic Signature(s) Signed: 04/17/2015 4:44:47 PM By: Alric Quan Entered By: Alric Quan on 04/17/2015 13:37:55

## 2015-04-22 DIAGNOSIS — F429 Obsessive-compulsive disorder, unspecified: Secondary | ICD-10-CM | POA: Diagnosis not present

## 2015-04-22 DIAGNOSIS — F411 Generalized anxiety disorder: Secondary | ICD-10-CM | POA: Diagnosis not present

## 2015-04-23 ENCOUNTER — Other Ambulatory Visit: Payer: Self-pay | Admitting: Gynecology

## 2015-04-24 ENCOUNTER — Encounter: Payer: Medicare Other | Admitting: Surgery

## 2015-04-24 DIAGNOSIS — I87331 Chronic venous hypertension (idiopathic) with ulcer and inflammation of right lower extremity: Secondary | ICD-10-CM | POA: Diagnosis not present

## 2015-04-24 DIAGNOSIS — E11622 Type 2 diabetes mellitus with other skin ulcer: Secondary | ICD-10-CM | POA: Diagnosis not present

## 2015-04-24 DIAGNOSIS — S41112A Laceration without foreign body of left upper arm, initial encounter: Secondary | ICD-10-CM | POA: Diagnosis not present

## 2015-04-24 DIAGNOSIS — I10 Essential (primary) hypertension: Secondary | ICD-10-CM | POA: Diagnosis not present

## 2015-04-24 DIAGNOSIS — E11610 Type 2 diabetes mellitus with diabetic neuropathic arthropathy: Secondary | ICD-10-CM | POA: Diagnosis not present

## 2015-04-24 DIAGNOSIS — L97211 Non-pressure chronic ulcer of right calf limited to breakdown of skin: Secondary | ICD-10-CM | POA: Diagnosis not present

## 2015-04-25 NOTE — Progress Notes (Signed)
[name], [name] ([id number]) Visit Report for 04/24/2015 Arrival Information Details Patient Name: [name], [name] Date of Service: 04/24/2015 2:45 PM Medical Record Number: [id number] Patient Account Number: [id number] Date of Birth/Sex: 20-Oct-1927 (82 y.o. Female) Treating RN: [name], [name] Primary Care Physician: [name] Other Clinician: Referring Physician: Ria [name] Treating Physician/Extender: [name] in Treatment: 70 Visit Information History Since Last Visit All ordered tests and consults were completed: No Patient Arrived: Walker Added or deleted any medications: No Arrival Time: 14:41 Any new allergies or adverse reactions: No Accompanied By: self Had a fall or experienced change in No Transfer Assistance: None activities of daily living that may affect Patient Requires Transmission- No risk of falls: Based Precautions: Signs or symptoms of abuse/neglect since last No Patient Has Alerts: Yes visito Patient Alerts: Patient on Blood Hospitalized since last visit: No Thinner Pain Present Now: No Type II Diabetic 81 MG aspirin Electronic Signature(s) Signed: 04/24/2015 5:04:23 PM By: [name] Entered By: [name] on 04/24/2015 14:41:28 [name], [name] ([id number]) -------------------------------------------------------------------------------- Encounter Discharge Information Details Patient Name: [name], [name] Date of Service: 04/24/2015 2:45 PM Medical Record Number: [id number] Patient Account Number: [id number] Date of Birth/Sex: [birth year] (82 y.o. Female) Treating RN: [name], [name] Primary Care Physician: [name] Other Clinician: Referring Physician: Ria [name] Treating Physician/Extender: [name] in Treatment: 36 Encounter Discharge Information Items Discharge Pain Level: 0 Discharge Condition: Stable Ambulatory Status: Walker Discharge Destination: Home Transportation:  Private Auto Accompanied By: son Schedule Follow-up Appointment: Yes Medication Reconciliation completed Yes and provided to Patient/Care [name]: Provided on Clinical Summary of Care: 04/24/2015 Form Type Recipient Paper Patient LL Electronic Signature(s) Signed: 04/24/2015 5:04:23 PM By: [name] Previous Signature: 04/24/2015 3:18:51 PM Version By: [name] Entered By: [name] on 04/24/2015 15:22:36 [name], [name] ([id number]) -------------------------------------------------------------------------------- Lower Extremity Assessment Details Patient Name: [name], [name] Date of Service: 04/24/2015 2:45 PM Medical Record Number: [id number] Patient Account Number: [id number] Date of Birth/Sex: 1927/07/05 (82 y.o. Female) Treating RN: [name], [name] Primary Care Physician: [name] Other Clinician: Referring Physician: Ria [name] Treating Physician/Extender: [name] in Treatment: 36 Edema Assessment Assessed: [Left: No] [Right: No] Edema: [Left: Ye] [Right: s] Calf Left: Right: Point of Measurement: cm From Medial Instep cm 26 cm Ankle Left: Right: Point of Measurement: cm From Medial Instep cm 19 cm Vascular Assessment Pulses: Posterior Tibial Dorsalis Pedis Palpable: [Right:Yes] Extremity colors, hair growth, and conditions: Extremity Color: [Right:Mottled] Temperature of Extremity: [Right:Warm] Capillary Refill: [Right:< 3 seconds] Toe Nail Assessment Left: Right: Thick: No Discolored: No Deformed: Yes Improper Length and Hygiene: No Electronic Signature(s) Signed: 04/24/2015 5:04:23 PM By: [name] Entered By: [name] on 04/24/2015 14:52:53 Lefferts, [name] Kitchen ([id number]) -------------------------------------------------------------------------------- Multi Wound Chart Details Patient Name: [name], [name] Date of Service: 04/24/2015 2:45 PM Medical Record Number: [id number] Patient  Account Number: [id number] Date of Birth/Sex: Jan 25, 1928 (82 y.o. Female) Treating RN: [name], [name] Primary Care Physician: [name] Other Clinician: Referring Physician: Ria [name] Treating Physician/Extender: [name] in Treatment: 36 Vital Signs Height(in): 58 Pulse(bpm): 81 Weight(lbs): 151 Blood Pressure 125/55 (mmHg): Body Mass Index(BMI): 32 Temperature(F): 98.1 Respiratory Rate 20 (breaths/min): Photos: [1:No Photos] [N/A:N/A] Wound Location: [1:Right Lower Leg - Medial] [N/A:N/A] Wounding Event: [1:Gradually Appeared] [N/A:N/A] Primary Etiology: [1:Venous Leg Ulcer] [N/A:N/A] Comorbid History: [1:Cataracts, Anemia, Coronary Artery Disease, Hypertension, Myocardial Infarction, Type II Diabetes, Osteoarthritis] [N/A:N/A] Date Acquired: [1:04/04/2014] [N/A:N/A] Weeks of Treatment: [1:36] [N/A:N/A] Wound Status: [1:Open] [N/A:N/A] Measurements L x W x D 3.2x0.9x0.2 [  N/A:N/A] (cm) Area (cm) : [1:2.262] [N/A:N/A] Volume (cm) : [1:0.452] [N/A:N/A] % Reduction in Area: [1:61.60%] [N/A:N/A] % Reduction in Volume: 74.40% [N/A:N/A] Classification: [1:Full Thickness Without Exposed Support Structures] [N/A:N/A] HBO Classification: [1:Grade 1] [N/A:N/A] Exudate Amount: [1:Medium] [N/A:N/A] Exudate Type: [1:Serosanguineous] [N/A:N/A] Exudate Color: [1:red, brown] [N/A:N/A] Wound Margin: [1:Distinct, outline attached] [N/A:N/A] Granulation Amount: [1:Large (67-100%)] [N/A:N/A] Granulation Quality: [1:Red, Pink] [N/A:N/A] Necrotic Amount: [1:Small (1-33%)] [N/A:N/A] Exposed Structures: [N/A:N/A] Fascia: No Fat: No Tendon: No Muscle: No Joint: No Bone: No Limited to Skin Breakdown Epithelialization: None N/A N/A Periwound Skin Texture: Edema: Yes N/A N/A Excoriation: No Induration: No Callus: No Crepitus: No Fluctuance: No Friable: No Rash: No Scarring: No Periwound Skin Moist: Yes N/A N/A Moisture: Maceration: No Dry/Scaly:  No Periwound Skin Color: Erythema: Yes N/A N/A Mottled: Yes Atrophie Blanche: No Cyanosis: No Ecchymosis: No Hemosiderin Staining: No Pallor: No Rubor: No Erythema Location: Circumferential N/A N/A Temperature: No Abnormality N/A N/A Tenderness on Yes N/A N/A Palpation: Wound Preparation: Ulcer Cleansing: Other: N/A N/A soap and water Topical Anesthetic Applied: Other: lidocaine 4% Treatment Notes Electronic Signature(s) Signed: 04/24/2015 5:04:23 PM By: [name] Entered By: [name] on 04/24/2015 14:55:36 [name], [name] ([id number]) -------------------------------------------------------------------------------- [place] Details Patient Name: [name], [name] Date of Service: 04/24/2015 2:45 PM Medical Record Number: [id number] Patient Account Number: [id number] Date of Birth/Sex: [birth year] (82 y.o. Female) Treating RN: [name], [name] Primary Care Physician: [name] Other Clinician: Referring Physician: Ria [name] Treating Physician/Extender: [name] in Treatment: 13 Active Inactive Abuse / Safety / Falls / Self Care Management Nursing Diagnoses: Potential for falls Goals: Patient will remain injury free Date Initiated: 08/15/2014 Goal Status: Active Interventions: Assess fall risk on admission and as needed Notes: Nutrition Nursing Diagnoses: Impaired glucose control: actual or potential Goals: Patient/caregiver verbalizes understanding of need to maintain therapeutic glucose control per primary care physician Date Initiated: 08/15/2014 Goal Status: Active Interventions: Assess patient nutrition upon admission and as needed per policy Notes: Orientation to the Wound Care Program Nursing Diagnoses: Knowledge deficit related to the wound healing center program Goals: Patient/caregiver will verbalize understanding of the [place], Francelia C. ([id number]) Date  Initiated: 08/15/2014 Goal Status: Active Interventions: Provide education on orientation to the wound center Notes: Venous Leg Ulcer Nursing Diagnoses: Actual venous Insuffiency (use after diagnosis is confirmed) Goals: Patient will maintain optimal edema control Date Initiated: 08/15/2014 Goal Status: Active Interventions: Compression as ordered Treatment Activities: Test ordered outside of clinic : 04/24/2015 Notes: Wound/Skin Impairment Nursing Diagnoses: Impaired tissue integrity Goals: Patient/caregiver will verbalize understanding of skin care regimen Date Initiated: 08/15/2014 Goal Status: Active Interventions: Assess patient/caregiver ability to obtain necessary supplies Notes: Electronic Signature(s) Signed: 04/24/2015 5:04:23 PM By: [name] Entered By: [name] on 04/24/2015 14:55:29 Milius, Amberlea C. ([id number]) -------------------------------------------------------------------------------- Pain Assessment Details Patient Name: Peaden, Isadore C. Date of Service: 04/24/2015 2:45 PM Medical Record Number: [id number] Patient Account Number: [id number] Date of Birth/Sex: [birth year] (82 y.o. Female) Treating RN: [name], [name] Primary Care Physician: [name] Other Clinician: Referring Physician: Ria [name] Treating Physician/Extender: [name] in Treatment: 36 Active Problems Location of Pain Severity and Description of Pain Patient Has [name] No Site Locations Pain Management and Medication Current Pain Management: Electronic Signature(s) Signed: 04/24/2015 5:04:23 PM By: [name] Entered By: [name] on 04/24/2015 14:41:35 [name], [name] ([id number]) -------------------------------------------------------------------------------- Patient/Caregiver Education Details Patient Name: [name], [name] Date of Service: 04/24/2015 2:45 PM Medical Record Number: [id number] Patient Account Number:  [id number]  Date of Birth/Gender: [birth year] (82 y.o. Female) Treating RN: [name], [name] Primary Care Physician: [name] Other Clinician: Referring Physician: Ria [name] Treating Physician/Extender: [name] in Treatment: 15 Education Assessment Education Provided To: Patient Education Topics Provided Wound/Skin Impairment: Handouts: Other: do not get wrap wet Methods: Demonstration, Explain/Verbal Responses: State content correctly Electronic Signature(s) Signed: 04/24/2015 5:04:23 PM By: [name] Entered By: [name] on 04/24/2015 15:22:56 [name], [name] ([id number]) -------------------------------------------------------------------------------- Wound Assessment Details Patient Name: [name], [name] Date of Service: 04/24/2015 2:45 PM Medical Record Number: [id number] Patient Account Number: [id number] Date of Birth/Sex: 1927/06/16 (82 y.o. Female) Treating RN: [name], [name] Primary Care Physician: [name] Other Clinician: Referring Physician: Ria [name] Treating Physician/Extender: [name] in Treatment: 36 Wound Status Wound Number: 1 Primary Venous Leg Ulcer Etiology: Wound Location: Right Lower Leg - Medial Wound Open Wounding Event: Gradually Appeared Status: Date Acquired: 04/04/2014 Comorbid Cataracts, Anemia, Coronary Artery Weeks Of Treatment: 36 History: Disease, Hypertension, Myocardial Clustered Wound: No Infarction, Type II Diabetes, Osteoarthritis Photos Photo Uploaded By: [name] on 04/24/2015 16:53:18 Wound Measurements Length: (cm) 3.2 Width: (cm) 0.9 Depth: (cm) 0.2 Area: (cm) 2.262 Volume: (cm) 0.452 % Reduction in Area: 61.6% % Reduction in Volume: 74.4% Epithelialization: None Tunneling: No Undermining: No Wound Description Full Thickness Without Exposed Foul Odor Af Classification: Support Structures Diabetic Severity Grade 1 (Wagner): Wound  Margin: Distinct, outline attached Exudate Amount: Medium Exudate Type: Serosanguineous Exudate Color: red, brown ter Cleansing: No Wound Bed [name], [name] ([id number]) Granulation Amount: Large (67-100%) Exposed Structure Granulation Quality: Red, Pink Fascia Exposed: No Necrotic Amount: Small (1-33%) Fat Layer Exposed: No Necrotic Quality: Adherent Slough Tendon Exposed: No Muscle Exposed: No Joint Exposed: No Bone Exposed: No Limited to Skin Breakdown Periwound Skin Texture Texture Color No Abnormalities Noted: No No Abnormalities Noted: No Callus: No Atrophie Blanche: No Crepitus: No Cyanosis: No Excoriation: No Ecchymosis: No Fluctuance: No Erythema: Yes Friable: No Erythema Location: Circumferential Induration: No Hemosiderin Staining: No Localized Edema: Yes Mottled: Yes Rash: No Pallor: No Scarring: No Rubor: No Moisture Temperature / Pain No Abnormalities Noted: No Temperature: No Abnormality Dry / Scaly: No Tenderness on Palpation: Yes Maceration: No Moist: Yes Wound Preparation Ulcer Cleansing: Other: soap and water, Topical Anesthetic Applied: Other: lidocaine 4%, Treatment Notes Wound #1 (Right, Medial Lower Leg) 1. Cleansed with: Clean wound with Normal Saline 2. Anesthetic Topical Lidocaine 4% cream to wound bed prior to debridement 3. Peri-wound Care: Skin Prep 4. Dressing Applied: Other dressing (specify in notes) 5. Secondary Dressing Applied ABD Pad 7. Secured with 2 Layer Compression System - Right Lower Extremity Notes [place], Elise C. ([id number]) Puraply application in clinic; including contact layer, fixation with steri strips, dry gauze and cover dressing. Electronic Signature(s) Signed: 04/24/2015 5:04:23 PM By: [name] Entered By: [name] on 04/24/2015 14:54:29 Sleight, [name] ([id number]) -------------------------------------------------------------------------------- [place] Details Patient  Name: Game, Zariel C. Date of Service: 04/24/2015 2:45 PM Medical Record Number: [id number] Patient Account Number: [id number] Date of Birth/Sex: 1928-02-13 (82 y.o. Female) Treating RN: [name], [name] Primary Care Physician: [name] Other Clinician: Referring Physician: Ria [name] Treating Physician/Extender: [name] in Treatment: 36 Vital Signs Time Taken: 14:42 Temperature (F): 98.1 Height (in): 58 Pulse (bpm): 81 Weight (lbs): 151 Respiratory Rate (breaths/min): 20 Body Mass Index (BMI): 31.6 Blood Pressure (mmHg): 125/55 Reference Range: 80 - 120 mg / dl Electronic Signature(s) Signed: 04/24/2015 5:04:23 PM By: [name] Entered By: [name] on 04/24/2015 14:43:22

## 2015-04-25 NOTE — Progress Notes (Signed)
ANBERLIN, PIEL ([id number]) Visit Report for 04/24/2015 Chief Complaint Document Details Patient Name: [name], [name] Date of Service: 04/24/2015 2:45 PM Medical Record Number: [id number] Patient Account Number: [id number] Date of Birth/Sex: [birth year] (82 y.o. Female) Treating RN: Carolyne Fiscal, Debi Primary Care Physician: Ria Bush Other Clinician: Referring Physician: Ria Bush Treating Physician/Extender: Frann Rider in Treatment: 36 Information Obtained from: Patient Chief Complaint Patient presents for treatment of an open ulcer due to venous insufficiency. 82 year old patient who is known to me from the [facility] wound care center now comes in follow up here for a chronic venous ulceration of her right lower extremity which she's had for about 6 months. Electronic Signature(s) Signed: 04/24/2015 3:14:30 PM By: Christin Fudge MD, FACS Entered By: Christin Fudge on 04/24/2015 15:14:30 Falls, [name] ([id number]) -------------------------------------------------------------------------------- HPI Details Patient Name: [name], [name] Date of Service: 04/24/2015 2:45 PM Medical Record Number: [id number] Patient Account Number: [id number] Date of Birth/Sex: 1927-06-08 (82 y.o. Female) Treating RN: Carolyne Fiscal, Debi Primary Care Physician: Ria Bush Other Clinician: Referring Physician: Ria Bush Treating Physician/Extender: Frann Rider in Treatment: 36 History of Present Illness Location: ulcer in the right lower extremity for several months Quality: Patient reports experiencing a dull pain to affected area(s). Severity: Patient states wound (s) are getting better. Duration: Patient has had the wound for > 3 months prior to seeking treatment at the wound center Timing: Pain in wound is Intermittent (comes and goes Context: The wound appeared gradually over time Modifying Factors: Consults to this date include: vascular surgery  for the veins in December 2015 and she's been seeing the wound care center at [facility] for several months. Associated Signs and Symptoms: Patient reports having difficulty standing for long periods. HPI Description: This is an 82 yrs old female we have been following since the beginning of January for a wound on the right medial ankle. She had previously had venous ablation of the greater saphenous vein by Dr. Kellie Simmering. She came to [place] with erythema of the right leg and subsequently is opened in the medial aspect of the right ankle. She has not been very compliant with compression. We ordered her at juxta lite stocking, however she apparently has not been able to wear it. The area on the medial aspect of her left ankle has much less induration and erythema. The edema is better controlled. No evidence of infection is noted. 07/18/14; no major change in the condition of this wound. There is still surface slough and probably some drainage. 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 All other veins showed no evidence of DVT, superficial venous thrombosis or incompetence. however she does have manifestations of chronic venous hypertension and I believe we will change her dressing over to Prisma and a Profore light dressing. she sees her primary care doctor regularly and her last hemoglobin A1c has been around 7%.her other comorbidities are hypertension, CABG in 1998, peptic ulcer disease, history of echo with a ejection fraction of 55-60% and grade 1 diastolic dysfunction. past surgical history include coronary artery bypass graft in 1998, cholecystectomy, appendectomy, hand surgery, endovenous ablation of the saphenous vein with laser by Dr. Kellie Simmering in December 2015. 08/29/2014 - she complains of increased calf pain over the past week. removed her compression bandage and pain  improved. no fever or chills. Mild clear drainage. 09/04/2014 -- she has not been compliant and  has not done a dressing change as we had ordered last week. She always complains and does not keep her wrap on and this past week she has done the same. She has completed a course of doxycycline. 09/25/2014 -- she has tolerated her dressing all week and has not removed it. She says it's hurting a lot today and would not like any debridement. We will do so as per her wishes. she did get a appointment with the vascular surgeon and sees him on 6/28. [name] ([id number]) 10/03/2014 - She was seen by Dr. Tinnie Gens 09/30/2014. Assessment: I reviewed the previous venous duplex exam study performed in September 2015 which reveals no options for treating this venous ulcer. There is no significant superficial venous reflux to treat with laser ablation.Most recent ABI was 0.91 in the right foot with palpable dorsalis pedis pulse at 3+ Plan: patient needs to return to the [facility] wound center and be treated by Dr. Con Memos. No arterial or venous options available from vascular standpoint or treatment of right leg stasis ulcer.Continued dressing changes and compression is the treatment of choice 10/16/2014 -- today will be her first application of Apligraf. 10/23/2014 -- we are going to be able to apply her second day of Apligraf next week. She is complaining of some tenderness in the region of Achilles tendon and this is possibly due to her wrap. 11/06/2014 -- she did not have Apligraf last week because the wound was not looking good but she says she is feeling much better now. 11/13/2014 -- she has been doing well this week and she is here for her second Apligraf. 11/27/2014 -- she is here for her third application of Apligraf 12/12/2014 -- she is here for her fourth application of Apligraf. Also during these past 3 days she had an injury to her left forearm which she caught against the edge of  her desk and has a lacerated wound which has gone down to the subcutaneous tissue. 12/26/2014 -- she is here for a last/fifth application of Apligraf. 04/10/2015 -- the patient is here for her first application of Puraply. 04/17/2015 -- the patient is here for her second application of Puraply. 04/24/2015 -- her Puraply was not ordered for today and hence he would use a collagen dressing locally Electronic Signature(s) Signed: 04/24/2015 3:15:08 PM By: Christin Fudge MD, FACS Entered By: Christin Fudge on 04/24/2015 15:15:07 Mckeone, [name] ([id number]) -------------------------------------------------------------------------------- Physical Exam Details Patient Name: Fritze, Winni C. Date of Service: 04/24/2015 2:45 PM Medical Record Number: [id number] Patient Account Number: [id number] Date of Birth/Sex: Mar 18, 1928 (82 y.o. Female) Treating RN: Carolyne Fiscal, Debi Primary Care Physician: Ria Bush Other Clinician: Referring Physician: Ria Bush Treating Physician/Extender: Frann Rider in Treatment: 36 Constitutional . Pulse regular. Respirations normal and unlabored. Afebrile. . Eyes Nonicteric. Reactive to light. Ears, Nose, Mouth, and Throat Lips, teeth, and gums WNL.Marland Kitchen Moist mucosa without lesions. Neck supple and nontender. No palpable supraclavicular or cervical adenopathy. Normal sized without goiter. Respiratory WNL. No retractions.. Cardiovascular Pedal Pulses WNL. No clubbing, cyanosis or edema. Lymphatic No adneopathy. No adenopathy. No adenopathy. Musculoskeletal Adexa without tenderness or enlargement.. Digits and nails w/o clubbing, cyanosis, infection, petechiae, ischemia, or inflammatory conditions.. Integumentary (Hair, Skin) No suspicious lesions. No crepitus or fluctuance. No peri-wound warmth or erythema. No masses.Marland Kitchen Psychiatric Judgement and insight Intact.. No evidence of depression, anxiety, or agitation.. Notes the wound was  cleansed nicely and a collagen substitute was applied in the usual fashion as the wound was looking very  good. Electronic Signature(s) Signed: 04/24/2015 3:15:52 PM By: Christin Fudge MD, FACS Entered By: Christin Fudge on 04/24/2015 15:15:52 Starke, [name] ([id number]) -------------------------------------------------------------------------------- Physician Orders Details Patient Name: Rafuse, Kaily C. Date of Service: 04/24/2015 2:45 PM Medical Record Number: [id number] Patient Account Number: [id number] Date of Birth/Sex: 04-Jan-1928 (82 y.o. Female) Treating RN: Carolyne Fiscal, Debi Primary Care Physician: Ria Bush Other Clinician: Referring Physician: Ria Bush Treating Physician/Extender: Frann Rider in Treatment: 47 Verbal / Phone Orders: Yes Clinician: Carolyne Fiscal, Debi Read Back and Verified: Yes Diagnosis Coding Primary Wound Dressing Wound #1 Right,Medial Lower Leg o Other: - collagen dressing Secondary Dressing Wound #1 Right,Medial Lower Leg o ABD pad o Foam - to heel o Mepitel One - and steri-strips Dressing Change Frequency Wound #1 Right,Medial Lower Leg o Change dressing every week Follow-up Appointments Wound #1 Right,Medial Lower Leg o Return Appointment in 1 week. Edema Control Wound #1 Right,Medial Lower Leg o 2 Layer Compression System - Right Lower Extremity o Elevate legs to the level of the heart and pump ankles as often as possible Notes Order more Puraply or pt. Electronic Signature(s) Signed: 04/24/2015 4:37:54 PM By: Christin Fudge MD, FACS Signed: 04/24/2015 5:04:23 PM By: Alric Quan Entered By: Alric Quan on 04/24/2015 15:23:57 Beer, [name] ([id number]) -------------------------------------------------------------------------------- Problem List Details Patient Name: Lisanti, Makaria C. Date of Service: 04/24/2015 2:45 PM Medical Record Number: [id number] Patient Account Number:  [id number] Date of Birth/Sex: 05-23-1927 (82 y.o. Female) Treating RN: Carolyne Fiscal, Debi Primary Care Physician: Ria Bush Other Clinician: Referring Physician: Ria Bush Treating Physician/Extender: Frann Rider in Treatment: 2 Active Problems ICD-10 Encounter Code Description Active Date Diagnosis E11.622 Type 2 diabetes mellitus with other skin ulcer 08/15/2014 Yes E11.610 Type 2 diabetes mellitus with diabetic neuropathic 08/15/2014 Yes arthropathy I87.331 Chronic venous hypertension (idiopathic) with ulcer and 08/15/2014 Yes inflammation of right lower extremity S41.112A Laceration without foreign body of left upper arm, initial 12/12/2014 Yes encounter Inactive Problems Resolved Problems ICD-10 Code Description Active Date Resolved Date L03.115 Cellulitis of right lower limb 08/29/2014 08/29/2014 Electronic Signature(s) Signed: 04/24/2015 3:14:18 PM By: Christin Fudge MD, FACS Entered By: Christin Fudge on 04/24/2015 15:14:18 Friar, [name] ([id number]) -------------------------------------------------------------------------------- Progress Note Details Patient Name: Helbert, Ruthene C. Date of Service: 04/24/2015 2:45 PM Medical Record Number: [id number] Patient Account Number: [id number] Date of Birth/Sex: [birth year] (82 y.o. Female) Treating RN: Carolyne Fiscal, Debi Primary Care Physician: Ria Bush Other Clinician: Referring Physician: Ria Bush Treating Physician/Extender: Frann Rider in Treatment: 36 Subjective Chief Complaint Information obtained from Patient Patient presents for treatment of an open ulcer due to venous insufficiency. 82 year old patient who is known to me from the [facility] wound care center now comes in follow up here for a chronic venous ulceration of her right lower extremity which she's had for about 6 months. History of Present Illness (HPI) The following HPI elements were documented for the patient's  wound: Location: ulcer in the right lower extremity for several months Quality: Patient reports experiencing a dull pain to affected area(s). Severity: Patient states wound (s) are getting better. Duration: Patient has had the wound for > 3 months prior to seeking treatment at the wound center Timing: Pain in wound is Intermittent (comes and goes Context: The wound appeared gradually over time Modifying Factors: Consults to this date include: vascular surgery for the veins in December 2015 and she's been seeing the wound care center at [facility] for several months. Associated Signs and Symptoms: Patient reports having difficulty standing for long periods. This is  an 82 yrs old female we have been following since the beginning of January for a wound on the right medial ankle. She had previously had venous ablation of the greater saphenous vein by Dr. Kellie Simmering. She came to [place] with erythema of the right leg and subsequently is opened in the medial aspect of the right ankle. She has not been very compliant with compression. We ordered her at juxta lite stocking, however she apparently has not been able to wear it. The area on the medial aspect of her left ankle has much less induration and erythema. The edema is better controlled. No evidence of infection is noted. 07/18/14; no major change in the condition of this wound. There is still surface slough and probably some drainage. 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 All other veins showed no evidence of DVT, superficial venous thrombosis or incompetence. however she does have manifestations of chronic venous hypertension and I believe we will change her dressing over to Prisma and a Profore light dressing. she sees her primary care doctor regularly and her last hemoglobin A1c has been around 7%.her other comorbidities are  hypertension, CABG in 1998, peptic ulcer disease, history of echo with a ejection fraction of 55-60% and grade 1 diastolic dysfunction. past surgical history include coronary artery bypass graft in 1998, cholecystectomy, appendectomy, hand surgery, endovenous ablation of the saphenous vein with laser by Dr. Kellie Simmering in December 2015. GRACLYN, JARRIEL ([id number]) 08/29/2014 - she complains of increased calf pain over the past week. removed her compression bandage and pain improved. no fever or chills. Mild clear drainage. 09/04/2014 -- she has not been compliant and has not done a dressing change as we had ordered last week. She always complains and does not keep her wrap on and this past week she has done the same. She has completed a course of doxycycline. 09/25/2014 -- she has tolerated her dressing all week and has not removed it. She says it's hurting a lot today and would not like any debridement. We will do so as per her wishes. she did get a appointment with the vascular surgeon and sees him on 6/28. 10/03/2014 - She was seen by Dr. Tinnie Gens 09/30/2014. Assessment: I reviewed the previous venous duplex exam study performed in September 2015 which reveals no options for treating this venous ulcer. There is no significant superficial venous reflux to treat with laser ablation.Most recent ABI was 0.91 in the right foot with palpable dorsalis pedis pulse at 3+ Plan: patient needs to return to the [facility] wound center and be treated by Dr. Con Memos. No arterial or venous options available from vascular standpoint or treatment of right leg stasis ulcer.Continued dressing changes and compression is the treatment of choice 10/16/2014 -- today will be her first application of Apligraf. 10/23/2014 -- we are going to be able to apply her second day of Apligraf next week. She is complaining of some tenderness in the region of Achilles tendon and this is possibly due to her wrap. 11/06/2014 -- she  did not have Apligraf last week because the wound was not looking good but she says she is feeling much better now. 11/13/2014 -- she has been doing well this week and she is here for her second Apligraf. 11/27/2014 -- she is here for her third application of Apligraf 12/12/2014 -- she is here for her fourth  application of Apligraf. Also during these past 3 days she had an injury to her left forearm which she caught against the edge of her desk and has a lacerated wound which has gone down to the subcutaneous tissue. 12/26/2014 -- she is here for a last/fifth application of Apligraf. 04/10/2015 -- the patient is here for her first application of Puraply. 04/17/2015 -- the patient is here for her second application of Puraply. 04/24/2015 -- her Puraply was not ordered for today and hence he would use a collagen dressing locally Objective Constitutional Pulse regular. Respirations normal and unlabored. Afebrile. Vitals Time Taken: 2:42 PM, Height: 58 in, Weight: 151 lbs, BMI: 31.6, Temperature: 98.1 F, Pulse: 81 bpm, Respiratory Rate: 20 breaths/min, Blood Pressure: 125/55 mmHg. Eyes Jentsch, Chrisy C. ([id number]) Nonicteric. Reactive to light. Ears, Nose, Mouth, and Throat Lips, teeth, and gums WNL.Marland Kitchen Moist mucosa without lesions. Neck supple and nontender. No palpable supraclavicular or cervical adenopathy. Normal sized without goiter. Respiratory WNL. No retractions.. Cardiovascular Pedal Pulses WNL. No clubbing, cyanosis or edema. Lymphatic No adneopathy. No adenopathy. No adenopathy. Musculoskeletal Adexa without tenderness or enlargement.. Digits and nails w/o clubbing, cyanosis, infection, petechiae, ischemia, or inflammatory conditions.Marland Kitchen Psychiatric Judgement and insight Intact.. No evidence of depression, anxiety, or agitation.. General Notes: the wound was cleansed nicely and a collagen substitute was applied in the usual fashion as the wound was looking very  good. Integumentary (Hair, Skin) No suspicious lesions. No crepitus or fluctuance. No peri-wound warmth or erythema. No masses.. Wound #1 status is Open. Original cause of wound was Gradually Appeared. The wound is located on the Right,Medial Lower Leg. The wound measures 3.2cm length x 0.9cm width x 0.2cm depth; 2.262cm^2 area and 0.452cm^3 volume. The wound is limited to skin breakdown. There is no tunneling or undermining noted. There is a medium amount of serosanguineous drainage noted. The wound margin is distinct with the outline attached to the wound base. There is large (67-100%) red, pink granulation within the wound bed. There is a small (1-33%) amount of necrotic tissue within the wound bed including Adherent Slough. The periwound skin appearance exhibited: Localized Edema, Moist, Mottled, Erythema. The periwound skin appearance did not exhibit: Callus, Crepitus, Excoriation, Fluctuance, Friable, Induration, Rash, Scarring, Dry/Scaly, Maceration, Atrophie Blanche, Cyanosis, Ecchymosis, Hemosiderin Staining, Pallor, Rubor. The surrounding wound skin color is noted with erythema which is circumferential. Periwound temperature was noted as No Abnormality. The periwound has tenderness on palpation. Assessment Active Problems Kartes, Nicle C. ([id number]) ICD-10 E11.622 - Type 2 diabetes mellitus with other skin ulcer E11.610 - Type 2 diabetes mellitus with diabetic neuropathic arthropathy I87.331 - Chronic venous hypertension (idiopathic) with ulcer and inflammation of right lower extremity S41.112A - Laceration without foreign body of left upper arm, initial encounter The wound is looking very good overall and we have applied a collagen substitute today and a 2 layer compression and we will see her back next week for the next application of Puraply Plan Primary Wound Dressing: Wound #1 Right,Medial Lower Leg: Other: - collagen dressing Secondary Dressing: Wound #1 Right,Medial  Lower Leg: ABD pad Foam - to heel Mepitel One - and steri-strips Dressing Change Frequency: Wound #1 Right,Medial Lower Leg: Change dressing every week Follow-up Appointments: Wound #1 Right,Medial Lower Leg: Return Appointment in 1 week. Edema Control: Wound #1 Right,Medial Lower Leg: 2 Layer Compression System - Right Lower Extremity Elevate legs to the level of the heart and pump ankles as often as possible General Notes: Order more Puraply or pt. The wound  is looking very good overall and we have applied a collagen substitute today and a 2 layer compression and we will see her back next week for the next application of Puraply Electronic Signature(s) Signed: 04/24/2015 4:43:44 PM By: Christin Fudge MD, FACS Previous Signature: 04/24/2015 4:43:21 PM Version By: Christin Fudge MD, FACS TYMBER, VASSAR ([id number]) Previous Signature: 04/24/2015 3:16:35 PM Version By: Christin Fudge MD, FACS Entered By: Christin Fudge on 04/24/2015 16:43:44 Morsch, [name] ([id number]) -------------------------------------------------------------------------------- SuperBill Details Patient Name: Rieger, Kayann C. Date of Service: 04/24/2015 Medical Record Number: [id number] Patient Account Number: [id number] Date of Birth/Sex: 09-15-1927 (82 y.o. Female) Treating RN: Carolyne Fiscal, Debi Primary Care Physician: Ria Bush Other Clinician: Referring Physician: Ria Bush Treating Physician/Extender: Frann Rider in Treatment: 36 Diagnosis Coding ICD-10 Codes Code Description E11.622 Type 2 diabetes mellitus with other skin ulcer E11.610 Type 2 diabetes mellitus with diabetic neuropathic arthropathy Chronic venous hypertension (idiopathic) with ulcer and inflammation of right lower I87.331 extremity S41.112A Laceration without foreign body of left upper arm, initial encounter Facility Procedures CPT4: Description Modifier Quantity Code [id number] (Facility Use Only) [phone] -  APPLY [place] RT 1 LEG Physician Procedures CPT4: Description Modifier Quantity Code [id number] - WC PHYS LEVEL 3 - EST PT 1 ICD-10 Description Diagnosis E11.622 Type 2 diabetes mellitus with other skin ulcer E11.610 Type 2 diabetes mellitus with diabetic neuropathic arthropathy I87.331 Chronic  venous hypertension (idiopathic) with ulcer and inflammation of right lower extremity S41.112A Laceration without foreign body of left upper arm, initial encounter Electronic Signature(s) Signed: 04/24/2015 4:37:54 PM By: Christin Fudge MD, FACS Signed: 04/24/2015 5:04:23 PM By: Alric Quan Previous Signature: 04/24/2015 3:16:50 PM Version By: Christin Fudge MD, FACS Entered By: Alric Quan on 04/24/2015 15:24:37

## 2015-05-01 ENCOUNTER — Encounter: Payer: Medicare Other | Admitting: Surgery

## 2015-05-01 DIAGNOSIS — I10 Essential (primary) hypertension: Secondary | ICD-10-CM | POA: Diagnosis not present

## 2015-05-01 DIAGNOSIS — L97211 Non-pressure chronic ulcer of right calf limited to breakdown of skin: Secondary | ICD-10-CM | POA: Diagnosis not present

## 2015-05-01 DIAGNOSIS — S41112A Laceration without foreign body of left upper arm, initial encounter: Secondary | ICD-10-CM | POA: Diagnosis not present

## 2015-05-01 DIAGNOSIS — E11622 Type 2 diabetes mellitus with other skin ulcer: Secondary | ICD-10-CM | POA: Diagnosis not present

## 2015-05-01 DIAGNOSIS — E11610 Type 2 diabetes mellitus with diabetic neuropathic arthropathy: Secondary | ICD-10-CM | POA: Diagnosis not present

## 2015-05-01 DIAGNOSIS — I87331 Chronic venous hypertension (idiopathic) with ulcer and inflammation of right lower extremity: Secondary | ICD-10-CM | POA: Diagnosis not present

## 2015-05-01 DIAGNOSIS — L97811 Non-pressure chronic ulcer of other part of right lower leg limited to breakdown of skin: Secondary | ICD-10-CM | POA: Diagnosis not present

## 2015-05-02 NOTE — Progress Notes (Addendum)
GAYNELL, STROW ([id number]) Visit Report for 05/01/2015 Chief Complaint Document Details Patient Name: [name], [name] Date of Service: 05/01/2015 2:45 PM Medical Record Number: [id number] Patient Account Number: [id number] Date of Birth/Sex: May 11, 1927 (82 y.o. Female) Treating RN: Carolyne Fiscal, Debi Primary Care Physician: Ria Bush Other Clinician: Referring Physician: Ria Bush Treating Physician/Extender: Frann Rider in Treatment: 37 Information Obtained from: Patient Chief Complaint Patient presents for treatment of an open ulcer due to venous insufficiency. 82 year old patient who is known to me from the [facility] wound care center now comes in follow up here for a chronic venous ulceration of her right lower extremity which she's had for about 6 months. Electronic Signature(s) Signed: 05/01/2015 3:24:19 PM By: Christin Fudge MD, FACS Entered By: Christin Fudge on 05/01/2015 15:24:19 Moor, [name] ([id number]) -------------------------------------------------------------------------------- Cellular or Tissue Based Product Details Patient Name: [name], [name] Date of Service: 05/01/2015 2:45 PM Medical Record Number: [id number] Patient Account Number: [id number] Date of Birth/Sex: 06/15/1927 (82 y.o. Female) Treating RN: Carolyne Fiscal, Debi Primary Care Physician: Ria Bush Other Clinician: Referring Physician: Ria Bush Treating Physician/Extender: Frann Rider in Treatment: 37 Cellular or Tissue Based Wound #1 Right,Medial Lower Leg Product Type Applied to: Performed By: Physician Christin Fudge, MD Cellular or Tissue Based Other Product Type: Time-Out Taken: Yes Location: trunk / arms / legs Wound Size (sq cm): 2.6 Product Size (sq cm): 10 Waste Size (sq cm): 7 Waste Reason: wound size Amount of Product Applied (sq cm): 3 Lot #: [id number] Expiration Date: 12/23/2016 Fenestrated: No Reconstituted: Yes Solution  Type: saline Solution Amount: 8ml Lot #: [id number] Solution Expiration 01/02/2017 Date: Secured: Yes Secured With: Steri-Strips Dressing Applied: Yes Primary Dressing: mepitel Procedural Pain: 0 Post Procedural Pain: 0 Response to Treatment: Procedure was tolerated well Post Procedure Diagnosis Same as Pre-procedure Electronic Signature(s) Signed: 05/06/2015 5:03:09 PM By: Alric Quan Previous Signature: 05/01/2015 4:07:42 PM Version By: Christin Fudge MD, FACS Entered By: Alric Quan on 05/01/2015 17:25:31 Foote, [name] ([id number]) [name], [name] ([id number]) -------------------------------------------------------------------------------- HPI Details Patient Name: [name], [name] Date of Service: 05/01/2015 2:45 PM Medical Record Number: [id number] Patient Account Number: [id number] Date of Birth/Sex: 07-20-1927 (82 y.o. Female) Treating RN: Carolyne Fiscal, Debi Primary Care Physician: Ria Bush Other Clinician: Referring Physician: Ria Bush Treating Physician/Extender: Frann Rider in Treatment: 37 History of Present Illness Location: ulcer in the right lower extremity for several months Quality: Patient reports experiencing a dull pain to affected area(s). Severity: Patient states wound (s) are getting better. Duration: Patient has had the wound for > 3 months prior to seeking treatment at the wound center Timing: Pain in wound is Intermittent (comes and goes Context: The wound appeared gradually over time Modifying Factors: Consults to this date include: vascular surgery for the veins in December 2015 and she's been seeing the wound care center at [facility] for several months. Associated Signs and Symptoms: Patient reports having difficulty standing for long periods. HPI Description: This is an 82 yrs old female we have been following since the beginning of January for a wound on the right medial ankle. She had previously had venous ablation  of the greater saphenous vein by Dr. Kellie Simmering. She came to [place] with erythema of the right leg and subsequently is opened in the medial aspect of the right ankle. She has not been very compliant with compression. We ordered her at juxta lite stocking, however she apparently has not been able to wear it. The area on the medial aspect of her left ankle  has much less induration and erythema. The edema is better controlled. No evidence of infection is noted. 07/18/14; no major change in the condition of this wound. There is still surface slough and probably some drainage. 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 All other veins showed no evidence of DVT, superficial venous thrombosis or incompetence. however she does have manifestations of chronic venous hypertension and I believe we will change her dressing over to Prisma and a Profore light dressing. she sees her primary care doctor regularly and her last hemoglobin A1c has been around 7%.her other comorbidities are hypertension, CABG in 1998, peptic ulcer disease, history of echo with a ejection fraction of 55-60% and grade 1 diastolic dysfunction. past surgical history include coronary artery bypass graft in 1998, cholecystectomy, appendectomy, hand surgery, endovenous ablation of the saphenous vein with laser by Dr. Kellie Simmering in December 2015. 08/29/2014 - she complains of increased calf pain over the past week. removed her compression bandage and pain improved. no fever or chills. Mild clear drainage. 09/04/2014 -- she has not been compliant and has not done a dressing change as we had ordered last week. She always complains and does not keep her wrap on and this past week she has done the same. She has completed a course of doxycycline. 09/25/2014 -- she has tolerated her dressing all week and has not removed it. She says it's  hurting a lot today and would not like any debridement. We will do so as per her wishes. she did get a appointment with the vascular surgeon and sees him on 6/28. [name], [name] 10/03/2014 - She was seen by Dr. Tinnie Gens 09/30/2014. Assessment: I reviewed the previous venous duplex exam study performed in September 2015 which reveals no options for treating this venous ulcer. There is no significant superficial venous reflux to treat with laser ablation.Most recent ABI was 0.91 in the right foot with palpable dorsalis pedis pulse at 3+ Plan: patient needs to return to the [facility] wound center and be treated by Dr. Con Memos. No arterial or venous options available from vascular standpoint or treatment of right leg stasis ulcer.Continued dressing changes and compression is the treatment of choice 10/16/2014 -- today will be her first application of Apligraf. 10/23/2014 -- we are going to be able to apply her second day of Apligraf next week. She is complaining of some tenderness in the region of Achilles tendon and this is possibly due to her wrap. 11/06/2014 -- she did not have Apligraf last week because the wound was not looking good but she says she is feeling much better now. 11/13/2014 -- she has been doing well this week and she is here for her second Apligraf. 11/27/2014 -- she is here for her third application of Apligraf 12/12/2014 -- she is here for her fourth application of Apligraf. Also during these past 3 days she had an injury to her left forearm which she caught against the edge of her desk and has a lacerated wound which has gone down to the subcutaneous tissue. 12/26/2014 -- she is here for a last/fifth application of Apligraf. 04/10/2015 -- the patient is here for her first application of Puraply. 04/17/2015 -- the patient is here for her second application of Puraply. 04/24/2015 -- her Puraply was not ordered for today and hence he would use a collagen  dressing locally. 05/01/2015 --  the patient is here for her third application of Puraply. Electronic Signature(s) Signed: 05/01/2015 3:25:10 PM By: Christin Fudge MD, FACS Entered By: Christin Fudge on 05/01/2015 15:25:10 [name], [name] -------------------------------------------------------------------------------- Physical Exam Details Patient Name: Steeves, Salli C. Date of Service: 05/01/2015 2:45 PM Medical Record Number: [id number] Patient Account Number: [id number] Date of Birth/Sex: 04-30-1927 (82 y.o. Female) Treating RN: Carolyne Fiscal, Debi Primary Care Physician: Ria Bush Other Clinician: Referring Physician: Ria Bush Treating Physician/Extender: Frann Rider in Treatment: 37 Constitutional . Pulse regular. Respirations normal and unlabored. Afebrile. . Eyes Nonicteric. Reactive to light. Ears, Nose, Mouth, and Throat Lips, teeth, and gums WNL.Marland Kitchen Moist mucosa without lesions. Neck supple and nontender. No palpable supraclavicular or cervical adenopathy. Normal sized without goiter. Respiratory WNL. No retractions.. Cardiovascular Pedal Pulses WNL. No clubbing, cyanosis or edema. Lymphatic No adneopathy. No adenopathy. No adenopathy. Musculoskeletal Adexa without tenderness or enlargement.. Digits and nails w/o clubbing, cyanosis, infection, petechiae, ischemia, or inflammatory conditions.. Integumentary (Hair, Skin) No suspicious lesions. No crepitus or fluctuance. No peri-wound warmth or erythema. No masses.Marland Kitchen Psychiatric Judgement and insight Intact.. No evidence of depression, anxiety, or agitation.. Notes the wound is looking excellent and after following the usual precautions the next layer of Puraply is applied. Electronic Signature(s) Signed: 05/01/2015 3:26:23 PM By: Christin Fudge MD, FACS Entered By: Christin Fudge on 05/01/2015 15:26:23 [name], [name]  ([id number]) -------------------------------------------------------------------------------- Physician Orders Details Patient Name: [name], [name] Date of Service: 05/01/2015 2:45 PM Medical Record Number: [id number] Patient Account Number: [id number] Date of Birth/Sex: 1927-05-20 (82 y.o. Female) Treating RN: Carolyne Fiscal, Debi Primary Care Physician: Ria Bush Other Clinician: Referring Physician: Ria Bush Treating Physician/Extender: Frann Rider in Treatment: 107 Verbal / Phone Orders: Yes Clinician: Carolyne Fiscal, Debi Read Back and Verified: Yes Diagnosis Coding ICD-10 Coding Code Description E11.622 Type 2 diabetes mellitus with other skin ulcer E11.610 Type 2 diabetes mellitus with diabetic neuropathic arthropathy Chronic venous hypertension (idiopathic) with ulcer and inflammation of right lower I87.331 extremity S41.112A Laceration without foreign body of left upper arm, initial encounter Wound Cleansing Wound #1 Right,Medial Lower Leg o Cleanse wound with mild soap and water Anesthetic Wound #1 Right,Medial Lower Leg o Topical Lidocaine 4% cream applied to wound bed prior to debridement Skin Barriers/Peri-Wound Care Wound #1 Right,Medial Lower Leg o Skin Prep Primary Wound Dressing Wound #1 Right,Medial Lower Leg o Other: - Puraply applied o Mepitel One - stri-strips Secondary Dressing Wound #1 Right,Medial Lower Leg o ABD pad o Foam - to heel Dressing Change Frequency Wound #1 Right,Medial Lower Leg o Change dressing every week [name], [name] Follow-up Appointments Wound #1 Right,Medial Lower Leg o Return Appointment in 1 week. Edema Control Wound #1 Right,Medial Lower Leg o 2 Layer Compression System - Right Lower Extremity o Elevate legs to the level of the heart and pump ankles as often as possible Electronic Signature(s) Signed: 05/01/2015 4:48:22 PM By: Christin Fudge MD, FACS Signed: 05/06/2015  5:03:09 PM By: Alric Quan Entered By: Alric Quan on 05/01/2015 15:57:12 [name], [name] -------------------------------------------------------------------------------- Problem List Details Patient Name: Arvin, Darly C. Date of Service: 05/01/2015 2:45 PM Medical Record Number: [id number] Patient Account Number: [id number] Date of Birth/Sex: 1927-04-15 (82 y.o. Female) Treating RN: Carolyne Fiscal, Debi Primary Care Physician: Ria Bush Other Clinician: Referring Physician: Ria Bush Treating Physician/Extender: Frann Rider in Treatment: 77 Active Problems ICD-10 Encounter Code Description Active Date Diagnosis E11.622 Type 2 diabetes mellitus with other skin ulcer 08/15/2014 Yes E11.610 Type 2 diabetes mellitus with diabetic  neuropathic 08/15/2014 Yes arthropathy I87.331 Chronic venous hypertension (idiopathic) with ulcer and 08/15/2014 Yes inflammation of right lower extremity S41.112A Laceration without foreign body of left upper arm, initial 12/12/2014 Yes encounter Inactive Problems Resolved Problems ICD-10 Code Description Active Date Resolved Date L03.115 Cellulitis of right lower limb 08/29/2014 08/29/2014 Electronic Signature(s) Signed: 05/01/2015 3:24:13 PM By: Christin Fudge MD, FACS Entered By: Christin Fudge on 05/01/2015 15:24:13 [name], [name] -------------------------------------------------------------------------------- Progress Note Details Patient Name: Meinders, Margaretta C. Date of Service: 05/01/2015 2:45 PM Medical Record Number: [id number] Patient Account Number: [id number] Date of Birth/Sex: 01/01/1928 (82 y.o. Female) Treating RN: Carolyne Fiscal, Debi Primary Care Physician: Ria Bush Other Clinician: Referring Physician: Ria Bush Treating Physician/Extender: Frann Rider in Treatment: 88 Subjective Chief Complaint Information obtained from Patient Patient presents for treatment  of an open ulcer due to venous insufficiency. 82 year old patient who is known to me from the [facility] wound care center now comes in follow up here for a chronic venous ulceration of her right lower extremity which she's had for about 6 months. History of Present Illness (HPI) The following HPI elements were documented for the patient's wound: Location: ulcer in the right lower extremity for several months Quality: Patient reports experiencing a dull pain to affected area(s). Severity: Patient states wound (s) are getting better. Duration: Patient has had the wound for > 3 months prior to seeking treatment at the wound center Timing: Pain in wound is Intermittent (comes and goes Context: The wound appeared gradually over time Modifying Factors: Consults to this date include: vascular surgery for the veins in December 2015 and she's been seeing the wound care center at [facility] for several months. Associated Signs and Symptoms: Patient reports having difficulty standing for long periods. This is an 82 yrs old female we have been following since the beginning of January for a wound on the right medial ankle. She had previously had venous ablation of the greater saphenous vein by Dr. Kellie Simmering. She came to [place] with erythema of the right leg and subsequently is opened in the medial aspect of the right ankle. She has not been very compliant with compression. We ordered her at juxta lite stocking, however she apparently has not been able to wear it. The area on the medial aspect of her left ankle has much less induration and erythema. The edema is better controlled. No evidence of infection is noted. 07/18/14; no major change in the condition of this wound. There is still surface slough and probably some drainage. 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 All other veins showed  no evidence of DVT, superficial venous thrombosis or incompetence. however she does have manifestations of chronic venous hypertension and I believe we will change her dressing over to Prisma and a Profore light dressing. she sees her primary care doctor regularly and her last hemoglobin A1c has been around 7%.her other comorbidities are hypertension, CABG in 1998, peptic ulcer disease, history of echo with a ejection fraction of 55-60% and grade 1 diastolic dysfunction. past surgical history include coronary artery bypass graft in 1998, cholecystectomy, appendectomy, hand surgery, endovenous ablation of the saphenous vein with laser by Dr. Kellie Simmering in December 2015. ZULEYMA, FLORESCA ([id number]) 08/29/2014 - she complains of increased calf pain over the past week. removed her compression bandage and pain improved. no fever or chills. Mild clear drainage. 09/04/2014 -- she  has not been compliant and has not done a dressing change as we had ordered last week. She always complains and does not keep her wrap on and this past week she has done the same. She has completed a course of doxycycline. 09/25/2014 -- she has tolerated her dressing all week and has not removed it. She says it's hurting a lot today and would not like any debridement. We will do so as per her wishes. she did get a appointment with the vascular surgeon and sees him on 6/28. 10/03/2014 - She was seen by Dr. Tinnie Gens 09/30/2014. Assessment: I reviewed the previous venous duplex exam study performed in September 2015 which reveals no options for treating this venous ulcer. There is no significant superficial venous reflux to treat with laser ablation.Most recent ABI was 0.91 in the right foot with palpable dorsalis pedis pulse at 3+ Plan: patient needs to return to the [place] wound center and be treated by Dr. Con Memos. No arterial or venous options available from vascular standpoint or treatment of right leg stasis  ulcer.Continued dressing changes and compression is the treatment of choice 10/16/2014 -- today will be her first application of Apligraf. 10/23/2014 -- we are going to be able to apply her second day of Apligraf next week. She is complaining of some tenderness in the region of Achilles tendon and this is possibly due to her wrap. 11/06/2014 -- she did not have Apligraf last week because the wound was not looking good but she says she is feeling much better now. 11/13/2014 -- she has been doing well this week and she is here for her second Apligraf. 11/27/2014 -- she is here for her third application of Apligraf 12/12/2014 -- she is here for her fourth application of Apligraf. Also during these past 3 days she had an injury to her left forearm which she caught against the edge of her desk and has a lacerated wound which has gone down to the subcutaneous tissue. 12/26/2014 -- she is here for a last/fifth application of Apligraf. 04/10/2015 -- the patient is here for her first application of Puraply. 04/17/2015 -- the patient is here for her second application of Puraply. 04/24/2015 -- her Puraply was not ordered for today and hence he would use a collagen dressing locally. 05/01/2015 -- the patient is here for her third application of Puraply. Objective Constitutional Pulse regular. Respirations normal and unlabored. Afebrile. Vitals Time Taken: 3:29 PM, Height: 58 in, Weight: 151 lbs, BMI: 31.6, Temperature: 98.1 F, Pulse: 70 bpm, Respiratory Rate: 20 breaths/min, Blood Pressure: 121/54 mmHg. Sprecher, Ikesha C. ([id number]) Eyes Nonicteric. Reactive to light. Ears, Nose, Mouth, and Throat Lips, teeth, and gums WNL.Marland Kitchen Moist mucosa without lesions. Neck supple and nontender. No palpable supraclavicular or cervical adenopathy. Normal sized without goiter. Respiratory WNL. No retractions.. Cardiovascular Pedal Pulses WNL. No clubbing, cyanosis or edema. Lymphatic No adneopathy. No  adenopathy. No adenopathy. Musculoskeletal Adexa without tenderness or enlargement.. Digits and nails w/o clubbing, cyanosis, infection, petechiae, ischemia, or inflammatory conditions.Marland Kitchen Psychiatric Judgement and insight Intact.. No evidence of depression, anxiety, or agitation.. General Notes: the wound is looking excellent and after following the usual precautions the next layer of Puraply is applied. Integumentary (Hair, Skin) No suspicious lesions. No crepitus or fluctuance. No peri-wound warmth or erythema. No masses.. Wound #1 status is Open. Original cause of wound was Gradually Appeared. The wound is located on the Right,Medial Lower Leg. The wound measures 5.2cm length x 0.5cm width x 0.2cm depth; 2.042cm^2 area  and 0.408cm^3 volume. The wound is limited to skin breakdown. There is no tunneling or undermining noted. There is a medium amount of serosanguineous drainage noted. The wound margin is distinct with the outline attached to the wound base. There is large (67-100%) red, pink granulation within the wound bed. There is a small (1-33%) amount of necrotic tissue within the wound bed including Adherent Slough. The periwound skin appearance exhibited: Localized Edema, Moist, Mottled, Erythema. The periwound skin appearance did not exhibit: Callus, Crepitus, Excoriation, Fluctuance, Friable, Induration, Rash, Scarring, Dry/Scaly, Maceration, Atrophie Blanche, Cyanosis, Ecchymosis, Hemosiderin Staining, Pallor, Rubor. The surrounding wound skin color is noted with erythema which is circumferential. Periwound temperature was noted as No Abnormality. The periwound has tenderness on palpation. Assessment Danziger, Mishti C. ([id number]) Active Problems ICD-10 E11.622 - Type 2 diabetes mellitus with other skin ulcer E11.610 - Type 2 diabetes mellitus with diabetic neuropathic arthropathy I87.331 - Chronic venous hypertension (idiopathic) with ulcer and inflammation of right lower  extremity S41.112A - Laceration without foreign body of left upper arm, initial encounter Procedures Wound #1 Wound #1 is a Venous Leg Ulcer located on the Right,Medial Lower Leg. A skin graft procedure using a bioengineered skin substitute/cellular or tissue based product was performed by Christin Fudge, MD. Other was applied and secured with Steri-Strips. 3 sq cm of product was utilized and 7 sq cm was wasted due to wound size. Post Application, mepitel was applied. A Time Out was conducted prior to the start of the procedure. The procedure was tolerated well with a pain level of 0 throughout and a pain level of 0 following the procedure. Post procedure Diagnosis Wound #1: Same as Pre-Procedure . Plan Wound Cleansing: Wound #1 Right,Medial Lower Leg: Cleanse wound with mild soap and water Anesthetic: Wound #1 Right,Medial Lower Leg: Topical Lidocaine 4% cream applied to wound bed prior to debridement Skin Barriers/Peri-Wound Care: Wound #1 Right,Medial Lower Leg: Skin Prep Primary Wound Dressing: Wound #1 Right,Medial Lower Leg: Other: - Puraply applied Mepitel One - stri-strips Secondary Dressing: Wound #1 Right,Medial Lower Leg: ABD pad Foam - to heel Dressing Change Frequency: Wound #1 Right,Medial Lower Leg: Shimmin, Inetha C. ([id number]) Change dressing every week Follow-up Appointments: Wound #1 Right,Medial Lower Leg: Return Appointment in 1 week. Edema Control: Wound #1 Right,Medial Lower Leg: 2 Layer Compression System - Right Lower Extremity Elevate legs to the level of the heart and pump ankles as often as possible The wound is looking very good overall and we have applied third layer of Puraply today and a 2 layer compression and we will see her back next week for the next application of Puraply Electronic Signature(s) Signed: 05/04/2015 4:29:25 PM By: Christin Fudge MD, FACS Previous Signature: 05/04/2015 4:29:11 PM Version By: Christin Fudge MD, FACS Previous  Signature: 05/01/2015 4:07:57 PM Version By: Christin Fudge MD, FACS Previous Signature: 05/01/2015 3:27:15 PM Version By: Christin Fudge MD, FACS Entered By: Christin Fudge on 05/04/2015 16:29:25 Stander, Morayma Loletha [name] ([id number]) -------------------------------------------------------------------------------- SuperBill Details Patient Name: Galluzzo, Jenia C. Date of Service: 05/01/2015 Medical Record Number: [id number] Patient Account Number: [id number] Date of Birth/Sex: Mar 18, 1928 (82 y.o. Female) Treating RN: Carolyne Fiscal, Debi Primary Care Physician: Ria Bush Other Clinician: Referring Physician: Ria Bush Treating Physician/Extender: Frann Rider in Treatment: 37 Diagnosis Coding ICD-10 Codes Code Description E11.622 Type 2 diabetes mellitus with other skin ulcer E11.610 Type 2 diabetes mellitus with diabetic neuropathic arthropathy Chronic venous hypertension (idiopathic) with ulcer and inflammation of right lower I87.331 extremity S41.112A Laceration without foreign body of  left upper arm, initial encounter Facility Procedures CPT4: Description Modifier Quantity Code [id number] - SKIN SUB GRAFT TRNK/ARM/LEG 1 ICD-10 Description Diagnosis E11.622 Type 2 diabetes mellitus with other skin ulcer E11.610 Type 2 diabetes mellitus with diabetic neuropathic arthropathy I87.331  Chronic venous hypertension (idiopathic) with ulcer and inflammation of right lower extremity S41.112A Laceration without foreign body of left upper arm, initial encounter CPT4: Q4172 PuraPly Product AM 5X5 1 ICD-10 Description Diagnosis E11.622 Type 2 diabetes mellitus with other skin ulcer E11.610 Type 2 diabetes mellitus with diabetic neuropathic arthropathy I87.331 Chronic venous hypertension (idiopathic) with ulcer  and inflammation of right lower extremity S41.112A Laceration without foreign body of left upper arm, initial encounter Physician Procedures CPT4: Description Modifier Quantity  Code [id number] - WC PHYS SKIN SUB GRAFT TRNK/ARM/LEG 1 ICD-10 Description Diagnosis Faro, AUTIE WESTENBERGER ([id number]) Electronic Signature(s) Signed: 05/05/2015 3:26:09 PM By: Sharon Mt Signed: 05/05/2015 3:33:01 PM By: Christin Fudge MD, FACS Previous Signature: 05/01/2015 4:08:10 PM Version By: Christin Fudge MD, FACS Entered By: Sharon Mt on 05/05/2015 15:26:09

## 2015-05-02 NOTE — Progress Notes (Addendum)
EMSLEE, COLLENS ([id number]) Visit Report for 05/01/2015 Arrival Information Details Patient Name: [name], [name] Date of Service: 05/01/2015 2:45 PM Medical Record Number: [id number] Patient Account Number: [id number] Date of Birth/Sex: 1928/03/17 (82 y.o. Female) Treating RN: Carolyne Fiscal, Debi Primary Care Physician: Ria Bush Other Clinician: Referring Physician: Ria Bush Treating Physician/Extender: Frann Rider in Treatment: 21 Visit Information History Since Last Visit All ordered tests and consults were completed: No Patient Arrived: Walker Added or deleted any medications: No Arrival Time: 15:28 Any new allergies or adverse reactions: No Accompanied By: self Had a fall or experienced change in No Transfer Assistance: None activities of daily living that may affect Patient Identification Verified: Yes risk of falls: Secondary Verification Process Yes Signs or symptoms of abuse/neglect since last No Completed: visito Patient Requires Transmission- No Hospitalized since last visit: No Based Precautions: Pain Present Now: No Patient Has Alerts: Yes Patient Alerts: Patient on Blood Thinner Type II Diabetic 81 MG aspirin Electronic Signature(s) Signed: 05/06/2015 5:03:09 PM By: Alric Quan Entered By: Alric Quan on 05/01/2015 15:29:18 Kempfer, [name] ([id number]) -------------------------------------------------------------------------------- Encounter Discharge Information Details Patient Name: [name], [name] Date of Service: 05/01/2015 2:45 PM Medical Record Number: [id number] Patient Account Number: [id number] Date of Birth/Sex: [birth year] (82 y.o. Female) Treating RN: Carolyne Fiscal, Debi Primary Care Physician: Ria Bush Other Clinician: Referring Physician: Ria Bush Treating Physician/Extender: Frann Rider in Treatment: 37 Encounter Discharge Information Items Discharge Pain Level: 0 Discharge  Condition: Stable Ambulatory Status: Walker Discharge Destination: Home Transportation: Private Auto Accompanied By: son Schedule Follow-up Appointment: Yes Medication Reconciliation completed Yes and provided to Patient/Care Erleen Egner: Provided on Clinical Summary of Care: 05/01/2015 Form Type Recipient Paper Patient LL Electronic Signature(s) Signed: 05/01/2015 4:16:51 PM By: Ruthine Dose Entered By: Ruthine Dose on 05/01/2015 16:16:51 [name], [name] ([id number]) -------------------------------------------------------------------------------- Lower Extremity Assessment Details Patient Name: [name], [name] Date of Service: 05/01/2015 2:45 PM Medical Record Number: [id number] Patient Account Number: [id number] Date of Birth/Sex: [birth year] (82 y.o. Female) Treating RN: Carolyne Fiscal, Debi Primary Care Physician: Ria Bush Other Clinician: Referring Physician: Ria Bush Treating Physician/Extender: Frann Rider in Treatment: 37 Edema Assessment Assessed: [Left: No] [Right: No] E[Left: dema] [Right: :] Calf Left: Right: Point of Measurement: 27 cm From Medial Instep cm 26 cm Ankle Left: Right: Point of Measurement: 10 cm From Medial Instep cm 20 cm Vascular Assessment Pulses: Posterior Tibial Dorsalis Pedis Palpable: [Right:Yes] Extremity colors, hair growth, and conditions: Extremity Color: [Right:Red] Temperature of Extremity: [Right:Warm] Capillary Refill: [Right:< 3 seconds] Toe Nail Assessment Left: Right: Thick: No Discolored: No Deformed: No Improper Length and Hygiene: No Electronic Signature(s) Signed: 05/06/2015 5:03:09 PM By: Alric Quan Entered By: Alric Quan on 05/01/2015 15:35:20 Farrey, [name] ([id number]) -------------------------------------------------------------------------------- Multi Wound Chart Details Patient Name: [name], [name] Date of Service: 05/01/2015 2:45 PM Medical Record Number:  [id number] Patient Account Number: [id number] Date of Birth/Sex: 1927-11-26 (82 y.o. Female) Treating RN: Carolyne Fiscal, Debi Primary Care Physician: Ria Bush Other Clinician: Referring Physician: Ria Bush Treating Physician/Extender: Frann Rider in Treatment: 37 Vital Signs Height(in): 58 Pulse(bpm): 70 Weight(lbs): 151 Blood Pressure 121/54 (mmHg): Body Mass Index(BMI): 32 Temperature(F): 98.1 Respiratory Rate 20 (breaths/min): Photos: [1:No Photos] [N/A:N/A] Wound Location: [1:Right Lower Leg - Medial] [N/A:N/A] Wounding Event: [1:Gradually Appeared] [N/A:N/A] Primary Etiology: [1:Venous Leg Ulcer] [N/A:N/A] Comorbid History: [1:Cataracts, Anemia, Coronary Artery Disease, Hypertension, Myocardial Infarction, Type II Diabetes, Osteoarthritis] [N/A:N/A] Date Acquired: [1:04/04/2014] [N/A:N/A] Weeks of Treatment: [1:37] [N/A:N/A] Wound Status: [1:Open] [N/A:N/A] Measurements L x W x D  5.2x0.5x0.2 [N/A:N/A] (cm) Area (cm) : [1:2.042] [N/A:N/A] Volume (cm) : [1:0.408] [N/A:N/A] % Reduction in Area: [1:65.30%] [N/A:N/A] % Reduction in Volume: 76.90% [N/A:N/A] Classification: [1:Full Thickness Without Exposed Support Structures] [N/A:N/A] HBO Classification: [1:Grade 1] [N/A:N/A] Exudate Amount: [1:Medium] [N/A:N/A] Exudate Type: [1:Serosanguineous] [N/A:N/A] Exudate Color: [1:red, brown] [N/A:N/A] Wound Margin: [1:Distinct, outline attached] [N/A:N/A] Granulation Amount: [1:Large (67-100%)] [N/A:N/A] Granulation Quality: [1:Red, Pink] [N/A:N/A] Necrotic Amount: [1:Small (1-33%)] [N/A:N/A] Exposed Structures: [N/A:N/A] Fascia: No Fat: No Tendon: No Muscle: No Joint: No Bone: No Limited to Skin Breakdown Epithelialization: None N/A N/A Periwound Skin Texture: Edema: Yes N/A N/A Excoriation: No Induration: No Callus: No Crepitus: No Fluctuance: No Friable: No Rash: No Scarring: No Periwound Skin Moist: Yes N/A N/A Moisture: Maceration:  No Dry/Scaly: No Periwound Skin Color: Erythema: Yes N/A N/A Mottled: Yes Atrophie Blanche: No Cyanosis: No Ecchymosis: No Hemosiderin Staining: No Pallor: No Rubor: No Erythema Location: Circumferential N/A N/A Temperature: No Abnormality N/A N/A Tenderness on Yes N/A N/A Palpation: Wound Preparation: Ulcer Cleansing: Other: N/A N/A soap and water Topical Anesthetic Applied: Other: lidocaine 4% Treatment Notes Electronic Signature(s) Signed: 05/06/2015 5:03:09 PM By: Alric Quan Entered By: Alric Quan on 05/01/2015 15:41:17 [name], [name] ([id number]) -------------------------------------------------------------------------------- Funk Details Patient Name: [name], [name] Date of Service: 05/01/2015 2:45 PM Medical Record Number: [id number] Patient Account Number: [id number] Date of Birth/Sex: 10-27-1927 (82 y.o. Female) Treating RN: Carolyne Fiscal, Debi Primary Care Physician: Ria Bush Other Clinician: Referring Physician: Ria Bush Treating Physician/Extender: Frann Rider in Treatment: 37 Active Inactive Abuse / Safety / Falls / Self Care Management Nursing Diagnoses: Potential for falls Goals: Patient will remain injury free Date Initiated: 08/15/2014 Goal Status: Active Interventions: Assess fall risk on admission and as needed Notes: Nutrition Nursing Diagnoses: Impaired glucose control: actual or potential Goals: Patient/caregiver verbalizes understanding of need to maintain therapeutic glucose control per primary care physician Date Initiated: 08/15/2014 Goal Status: Active Interventions: Assess patient nutrition upon admission and as needed per policy Notes: Orientation to the Wound Care Program Nursing Diagnoses: Knowledge deficit related to the wound healing center program Goals: Patient/caregiver will verbalize understanding of the McDougal, Amry C.  ([id number]) Date Initiated: 08/15/2014 Goal Status: Active Interventions: Provide education on orientation to the wound center Notes: Venous Leg Ulcer Nursing Diagnoses: Actual venous Insuffiency (use after diagnosis is confirmed) Goals: Patient will maintain optimal edema control Date Initiated: 08/15/2014 Goal Status: Active Interventions: Compression as ordered Treatment Activities: Test ordered outside of clinic : 05/01/2015 Notes: Wound/Skin Impairment Nursing Diagnoses: Impaired tissue integrity Goals: Patient/caregiver will verbalize understanding of skin care regimen Date Initiated: 08/15/2014 Goal Status: Active Interventions: Assess patient/caregiver ability to obtain necessary supplies Notes: Electronic Signature(s) Signed: 05/06/2015 5:03:09 PM By: Alric Quan Entered By: Alric Quan on 05/01/2015 15:41:10 [name], [name] ([id number]) -------------------------------------------------------------------------------- Pain Assessment Details Patient Name: [name], [name] Date of Service: 05/01/2015 2:45 PM Medical Record Number: [id number] Patient Account Number: [id number] Date of Birth/Sex: 1928-02-13 (82 y.o. Female) Treating RN: Carolyne Fiscal, Debi Primary Care Physician: Ria Bush Other Clinician: Referring Physician: Ria Bush Treating Physician/Extender: Frann Rider in Treatment: 37 Active Problems Location of Pain Severity and Description of Pain Patient Has [name] No Site Locations Pain Management and Medication Current Pain Management: Electronic Signature(s) Signed: 05/06/2015 5:03:09 PM By: Alric Quan Entered By: Alric Quan on 05/01/2015 15:29:24 [name], [name] ([id number]) -------------------------------------------------------------------------------- Patient/Caregiver Education Details Patient Name: [name], [name] Date of Service: 05/01/2015 2:45 PM Medical Record Number: [id number] Patient  Account Number:  [id number] Date of Birth/Gender: 11-Jul-1927 (82 y.o. Female) Treating RN: Carolyne Fiscal, Debi Primary Care Physician: Ria Bush Other Clinician: Referring Physician: Ria Bush Treating Physician/Extender: Frann Rider in Treatment: 51 Education Assessment Education Provided To: Patient Education Topics Provided Wound/Skin Impairment: Handouts: Other: do not get wrap wet Methods: Demonstration, Explain/Verbal Responses: State content correctly Electronic Signature(s) Signed: 05/06/2015 5:03:09 PM By: Alric Quan Entered By: Alric Quan on 05/01/2015 15:58:28 Chalmers, Cleo C. ([id number]) -------------------------------------------------------------------------------- Wound Assessment Details Patient Name: [name], [name] Date of Service: 05/01/2015 2:45 PM Medical Record Number: [id number] Patient Account Number: [id number] Date of Birth/Sex: 1927/10/31 (82 y.o. Female) Treating RN: Carolyne Fiscal, Debi Primary Care Physician: Ria Bush Other Clinician: Referring Physician: Ria Bush Treating Physician/Extender: Frann Rider in Treatment: 72 Wound Status Wound Number: 1 Primary Venous Leg Ulcer Etiology: Wound Location: Right Lower Leg - Medial Wound Open Wounding Event: Gradually Appeared Status: Date Acquired: 04/04/2014 Comorbid Cataracts, Anemia, Coronary Artery Weeks Of Treatment: 37 History: Disease, Hypertension, Myocardial Clustered Wound: No Infarction, Type II Diabetes, Osteoarthritis Photos Photo Uploaded By: Alric Quan on 05/01/2015 17:55:57 Wound Measurements Length: (cm) 5.2 Width: (cm) 0.5 Depth: (cm) 0.2 Area: (cm) 2.042 Volume: (cm) 0.408 % Reduction in Area: 65.3% % Reduction in Volume: 76.9% Epithelialization: None Tunneling: No Undermining: No Wound Description Full Thickness Without Exposed Foul Odor Af Classification: Support Structures Diabetic Severity Grade  1 (Wagner): Wound Margin: Distinct, outline attached Exudate Amount: Medium Exudate Type: Serosanguineous Exudate Color: red, brown ter Cleansing: No Wound Bed Poliquin, Tynetta C. ([id number]) Granulation Amount: Large (67-100%) Exposed Structure Granulation Quality: Red, Pink Fascia Exposed: No Necrotic Amount: Small (1-33%) Fat Layer Exposed: No Necrotic Quality: Adherent Slough Tendon Exposed: No Muscle Exposed: No Joint Exposed: No Bone Exposed: No Limited to Skin Breakdown Periwound Skin Texture Texture Color No Abnormalities Noted: No No Abnormalities Noted: No Callus: No Atrophie Blanche: No Crepitus: No Cyanosis: No Excoriation: No Ecchymosis: No Fluctuance: No Erythema: Yes Friable: No Erythema Location: Circumferential Induration: No Hemosiderin Staining: No Localized Edema: Yes Mottled: Yes Rash: No Pallor: No Scarring: No Rubor: No Moisture Temperature / Pain No Abnormalities Noted: No Temperature: No Abnormality Dry / Scaly: No Tenderness on Palpation: Yes Maceration: No Moist: Yes Wound Preparation Ulcer Cleansing: Other: soap and water, Topical Anesthetic Applied: Other: lidocaine 4%, Treatment Notes Wound #1 (Right, Medial Lower Leg) 1. Cleansed with: Cleanse wound with antibacterial soap and water 2. Anesthetic Topical Lidocaine 4% cream to wound bed prior to debridement 3. Peri-wound Care: Skin Prep 4. Dressing Applied: Other dressing (specify in notes) 7. Secured with Tape 2 Layer Compression System - Right Lower Extremity Notes Puraply application in clinic; including contact layer, fixation with steri strips, dry gauze and cover dressing. ALOURA, STEPPER ([id number]) Electronic Signature(s) Signed: 05/06/2015 5:03:09 PM By: Alric Quan Entered By: Alric Quan on 05/01/2015 15:38:58 Risser, [name] ([id number]) -------------------------------------------------------------------------------- Vitals  Details Patient Name: Trimmer, Lacinda C. Date of Service: 05/01/2015 2:45 PM Medical Record Number: [id number] Patient Account Number: [id number] Date of Birth/Sex: [birth year] (82 y.o. Female) Treating RN: Carolyne Fiscal, Debi Primary Care Physician: Ria Bush Other Clinician: Referring Physician: Ria Bush Treating Physician/Extender: Frann Rider in Treatment: 37 Vital Signs Time Taken: 15:29 Temperature (F): 98.1 Height (in): 58 Pulse (bpm): 70 Weight (lbs): 151 Respiratory Rate (breaths/min): 20 Body Mass Index (BMI): 31.6 Blood Pressure (mmHg): 121/54 Reference Range: 80 - 120 mg / dl Electronic Signature(s) Signed: 05/06/2015 5:03:09 PM By: Alric Quan Entered By: Alric Quan on 05/01/2015 15:33:58

## 2015-05-04 ENCOUNTER — Encounter: Payer: Self-pay | Admitting: Gynecology

## 2015-05-04 ENCOUNTER — Ambulatory Visit (INDEPENDENT_AMBULATORY_CARE_PROVIDER_SITE_OTHER): Payer: Medicare Other | Admitting: Gynecology

## 2015-05-04 VITALS — BP 124/82 | Ht <= 58 in | Wt 156.0 lb

## 2015-05-04 DIAGNOSIS — N8111 Cystocele, midline: Secondary | ICD-10-CM | POA: Diagnosis not present

## 2015-05-04 DIAGNOSIS — Z01419 Encounter for gynecological examination (general) (routine) without abnormal findings: Secondary | ICD-10-CM | POA: Diagnosis not present

## 2015-05-04 DIAGNOSIS — N952 Postmenopausal atrophic vaginitis: Secondary | ICD-10-CM

## 2015-05-04 NOTE — Progress Notes (Signed)
[facility] 13-Jun-1927 [id number]        82 y.o.  [id number]  for breast and pelvic exam. Several issues noted below.  Past medical history,surgical history, problem list, medications, allergies, family history and social history were all reviewed and documented as reviewed in the EPIC chart.  ROS:  Performed with pertinent positives and negatives included in the history, assessment and plan.   Additional significant findings :  none   Exam: Caryn Bee assistant Filed Vitals:   05/04/15 1424  BP: 124/82  Height: 4\' 9"  (1.448 m)  Weight: 156 lb (70.761 kg)   General appearance:  Normal affect, orientation and appearance. Skin: Grossly normal HEENT: Without gross lesions.  No cervical or supraclavicular adenopathy. Thyroid normal.  Lungs:  Clear without wheezing, rales or rhonchi Cardiac: RR, without RMG Abdominal:  Soft, nontender, without masses, guarding, rebound, organomegaly or hernia Breasts:  Examined lying and sitting without masses, retractions, discharge or axillary adenopathy. Pelvic:  Ext/BUS/vagina with atrophic changes. With second-degree cystocele.  Adnexa without masses or tenderness    Anus and perineum normal   Rectovaginal normal sphincter tone without palpated masses or tenderness.    Assessment/Plan:  82 y.o. [id number] female for breast and pelvic exam.   1. Postmenopausal/atrophic genital changes. Patient doing well without significant hot flushes, night sweats or vaginal dryness. Does use Mytrex cream intermittently for irritation.  Status post TAH/BSO in the past. 2. Cystocele. Stable by exam. Status post vaginal vault prolapse repair with A&P repair by Dr. Rhodia Albright 2009.  Overall a symptomatically the patient. Will continue to monitor. 3. DEXA 2013 normal. Plan repeat next year five-year interval. 4. Mammography overdo. I again reviewed with her that she needs to schedule a mammogram and she acknowledges my recommendation. SBE monthly reviewed. 5. Colonoscopy  thousand 11. Repeated their recommended interval. 6. Pap smear 2012. No Pap smear done today.  We both agree to stop screening per current screening guidelines based upon no history of abnormal Pap smears, age greater than 55 and hysterectomy history. 7. Health maintenance. No lab work done as patient does this through her primary physician's office. Follow up 1 year, sooner as needed.   Anastasio Auerbach MD, 2:51 PM 05/04/2015

## 2015-05-04 NOTE — Patient Instructions (Signed)
Follow up in one year, sooner as needed. 

## 2015-05-05 ENCOUNTER — Other Ambulatory Visit: Payer: Self-pay | Admitting: Family Medicine

## 2015-05-05 ENCOUNTER — Other Ambulatory Visit: Payer: Self-pay

## 2015-05-05 MED ORDER — HYDROCODONE-ACETAMINOPHEN 5-325 MG PO TABS: 0.5000 | ORAL_TABLET | Freq: Four times a day (QID) | ORAL | Status: DC | PRN

## 2015-05-05 NOTE — Telephone Encounter (Signed)
Printed and in Kim's box 

## 2015-05-05 NOTE — Telephone Encounter (Signed)
Pt left v/m requesting rx hydrocodone apap. Call when ready for pick up. rx last printed # 40 on 04/03/15. Pt last saw Dr Darnell Level on 09/02/14. Please advise.

## 2015-05-05 NOTE — Telephone Encounter (Signed)
Patient notified and Rx placed up front for pick up. 

## 2015-05-07 ENCOUNTER — Telehealth: Payer: Self-pay | Admitting: Family Medicine

## 2015-05-07 MED ORDER — ESOMEPRAZOLE MAGNESIUM 40 MG PO CPDR: 40.0000 mg | DELAYED_RELEASE_CAPSULE | Freq: Every day | ORAL | Status: DC

## 2015-05-07 NOTE — Telephone Encounter (Signed)
Pt's son dropped off a list of approved medicines from the insurance company: Esomeprazole Omeprazole Lansoprazole Pantoprazole

## 2015-05-07 NOTE — Telephone Encounter (Signed)
Artrice called checking on her rx.  She stated she is out of her medications.  Pt stated she has a bad stomach. [facility] Please call pt when this has been called in  Pt stated this is very important she needs her rx today.   Best number to call [phone] home number

## 2015-05-07 NOTE — Telephone Encounter (Signed)
plz notify esomeprazole (nexium) 40mg  sent in. Try this and update [place] with effect. Was prior on dexilant 30mg  daily.

## 2015-05-07 NOTE — Telephone Encounter (Signed)
Patient's son called back and insisted prescription be called in today.  I let him know Dr.Gutierrez' is seeing patient's all afternoon.  I let him know if it can be done today, we'll give him a call. Otherwise, it will be done tomorrow.

## 2015-05-07 NOTE — Telephone Encounter (Signed)
Patient notified and verbalized understanding. 

## 2015-05-07 NOTE — Telephone Encounter (Signed)
Pt son [name] came in stating that ms Crisman went to pick up her rx dexliant.  It went up to 260.  She cannot afford this and wanted to know if there was something else she can take She is completely out of her meds Please advise  [facility]

## 2015-05-07 NOTE — Telephone Encounter (Signed)
Dr. Darnell Level is aware and will take care of this ASAP.

## 2015-05-08 ENCOUNTER — Encounter: Payer: Medicare Other | Attending: Surgery | Admitting: Surgery

## 2015-05-08 DIAGNOSIS — E11622 Type 2 diabetes mellitus with other skin ulcer: Secondary | ICD-10-CM | POA: Diagnosis not present

## 2015-05-08 DIAGNOSIS — E11610 Type 2 diabetes mellitus with diabetic neuropathic arthropathy: Secondary | ICD-10-CM | POA: Diagnosis not present

## 2015-05-08 DIAGNOSIS — L97811 Non-pressure chronic ulcer of other part of right lower leg limited to breakdown of skin: Secondary | ICD-10-CM | POA: Insufficient documentation

## 2015-05-08 DIAGNOSIS — S41112A Laceration without foreign body of left upper arm, initial encounter: Secondary | ICD-10-CM | POA: Diagnosis not present

## 2015-05-08 DIAGNOSIS — X58XXXA Exposure to other specified factors, initial encounter: Secondary | ICD-10-CM | POA: Insufficient documentation

## 2015-05-08 DIAGNOSIS — I10 Essential (primary) hypertension: Secondary | ICD-10-CM | POA: Diagnosis not present

## 2015-05-08 DIAGNOSIS — I87331 Chronic venous hypertension (idiopathic) with ulcer and inflammation of right lower extremity: Secondary | ICD-10-CM | POA: Insufficient documentation

## 2015-05-10 NOTE — Progress Notes (Addendum)
SHADOW, LETNER ([id number]) Visit Report for 05/08/2015 Arrival Information Details Patient Name: [name], [name] Date of Service: 05/08/2015 2:45 PM Medical Record Number: [id number] Patient Account Number: [id number] Date of Birth/Sex: 04-01-1928 (82 y.o. Female) Treating RN: Montey Hora Primary Care Physician: Ria Bush Other Clinician: Referring Physician: Ria Bush Treating Physician/Extender: Frann Rider in Treatment: 38 Visit Information History Since Last Visit Added or deleted any medications: No Patient Arrived: Walker Any new allergies or adverse reactions: No Arrival Time: 14:36 Had a fall or experienced change in No Accompanied By: self activities of daily living that may affect Transfer Assistance: None risk of falls: Patient Identification Verified: Yes Signs or symptoms of abuse/neglect since last No Secondary Verification Process Yes visito Completed: Hospitalized since last visit: No Patient Requires Transmission- No Pain Present Now: No Based Precautions: Patient Has Alerts: Yes Patient Alerts: Patient on Blood Thinner Type II Diabetic 81 MG aspirin Electronic Signature(s) Signed: 05/08/2015 5:19:21 PM By: Montey Hora Entered By: Montey Hora on 05/08/2015 14:36:55 Rockers, [name] ([id number]) -------------------------------------------------------------------------------- Clinic Level of Care Assessment Details Patient Name: [name], [name] Date of Service: 05/08/2015 2:45 PM Medical Record Number: [id number] Patient Account Number: [id number] Date of Birth/Sex: 05-15-1927 (82 y.o. Female) Treating RN: Montey Hora Primary Care Physician: Ria Bush Other Clinician: Referring Physician: Ria Bush Treating Physician/Extender: Frann Rider in Treatment: 38 Clinic Level of Care Assessment Items TOOL 4 Quantity Score []  - Use when only an EandM is performed on FOLLOW-UP visit 0 ASSESSMENTS -  Nursing Assessment / Reassessment X - Reassessment of Co-morbidities (includes updates in patient status) 1 10 X - Reassessment of Adherence to Treatment Plan 1 5 ASSESSMENTS - Wound and Skin Assessment / Reassessment X - Simple Wound Assessment / Reassessment - one wound 1 5 []  - Complex Wound Assessment / Reassessment - multiple wounds 0 []  - Dermatologic / Skin Assessment (not related to wound area) 0 ASSESSMENTS - Focused Assessment X - Circumferential Edema Measurements - multi extremities 1 5 []  - Nutritional Assessment / Counseling / Intervention 0 X - Lower Extremity Assessment (monofilament, tuning fork, pulses) 1 5 []  - Peripheral Arterial Disease Assessment (using hand held doppler) 0 ASSESSMENTS - Ostomy and/or Continence Assessment and Care []  - Incontinence Assessment and Management 0 []  - Ostomy Care Assessment and Management (repouching, etc.) 0 PROCESS - Coordination of Care X - Simple Patient / Family Education for ongoing care 1 15 []  - Complex (extensive) Patient / Family Education for ongoing care 0 []  - Staff obtains Programmer, systems, Records, Test Results / Process Orders 0 []  - Staff telephones HHA, Nursing Homes / Clarify orders / etc 0 []  - Routine Transfer to another Facility (non-emergent condition) 0 Hornig, Martasia C. ([id number]) []  - Routine Hospital Admission (non-emergent condition) 0 []  - New Admissions / Biomedical engineer / Ordering NPWT, Apligraf, etc. 0 []  - Emergency Hospital Admission (emergent condition) 0 X - Simple Discharge Coordination 1 10 []  - Complex (extensive) Discharge Coordination 0 PROCESS - Special Needs []  - Pediatric / Minor Patient Management 0 []  - Isolation Patient Management 0 []  - Hearing / Language / Visual special needs 0 []  - Assessment of Community assistance (transportation, D/C planning, etc.) 0 []  - Additional assistance / Altered mentation 0 []  - Support Surface(s) Assessment (bed, cushion, seat, etc.)  0 INTERVENTIONS - Wound Cleansing / Measurement X - Simple Wound Cleansing - one wound 1 5 []  - Complex Wound Cleansing - multiple wounds 0 X - Wound Imaging (photographs - any  number of wounds) 1 5 []  - Wound Tracing (instead of photographs) 0 X - Simple Wound Measurement - one wound 1 5 []  - Complex Wound Measurement - multiple wounds 0 INTERVENTIONS - Wound Dressings []  - Small Wound Dressing one or multiple wounds 0 []  - Medium Wound Dressing one or multiple wounds 0 X - Large Wound Dressing one or multiple wounds 1 20 []  - Application of Medications - topical 0 []  - Application of Medications - injection 0 INTERVENTIONS - Miscellaneous []  - External ear exam 0 [name], [name] ([id number]) []  - Specimen Collection (cultures, biopsies, blood, body fluids, etc.) 0 []  - Specimen(s) / Culture(s) sent or taken to Lab for analysis 0 []  - Patient Transfer (multiple staff / Harrel Lemon Lift / Similar devices) 0 []  - Simple Staple / Suture removal (25 or less) 0 []  - Complex Staple / Suture removal (26 or more) 0 []  - Hypo / Hyperglycemic Management (close monitor of Blood Glucose) 0 []  - Ankle / Brachial Index (ABI) - do not check if billed separately 0 X - Vital Signs 1 5 Has the patient been seen at the hospital within the last three years: Yes Total Score: 95 Level Of Care: New/Established - Level 3 Electronic Signature(s) Signed: 05/11/2015 4:27:56 PM By: Montey Hora Entered By: Montey Hora on 05/11/2015 16:27:55 Toon, [name] ([id number]) -------------------------------------------------------------------------------- Encounter Discharge Information Details Patient Name: [name], [name] Date of Service: 05/08/2015 2:45 PM Medical Record Number: [id number] Patient Account Number: [id number] Date of Birth/Sex: 1927/06/04 (82 y.o. Female) Treating RN: Montey Hora Primary Care Physician: Ria Bush Other Clinician: Referring Physician: Ria Bush Treating  Physician/Extender: Frann Rider in Treatment: 66 Encounter Discharge Information Items Discharge Pain Level: 0 Discharge Condition: Stable Ambulatory Status: Walker Discharge Destination: Home Transportation: Private Auto Accompanied By: son Schedule Follow-up Appointment: Yes Medication Reconciliation completed No and provided to Patient/Care Raymondo Garcialopez: Provided on Clinical Summary of Care: 05/08/2015 Form Type Recipient Paper Patient LL Electronic Signature(s) Signed: 05/11/2015 4:29:09 PM By: Montey Hora Previous Signature: 05/08/2015 3:14:04 PM Version By: Ruthine Dose Entered By: Montey Hora on 05/11/2015 16:29:09 Talmadge, Marlen Loletha Grayer ([id number]) -------------------------------------------------------------------------------- Lower Extremity Assessment Details Patient Name: [name], [name] Date of Service: 05/08/2015 2:45 PM Medical Record Number: [id number] Patient Account Number: [id number] Date of Birth/Sex: 11-Oct-1927 (82 y.o. Female) Treating RN: Montey Hora Primary Care Physician: Ria Bush Other Clinician: Referring Physician: Ria Bush Treating Physician/Extender: Frann Rider in Treatment: 38 Edema Assessment Assessed: [Left: No] [Right: No] Edema: [Left: Ye] [Right: s] Calf Left: Right: Point of Measurement: 27 cm From Medial Instep cm 26 cm Ankle Left: Right: Point of Measurement: 10 cm From Medial Instep cm 20 cm Vascular Assessment Pulses: Posterior Tibial Dorsalis Pedis Palpable: [Right:Yes] Extremity colors, hair growth, and conditions: Extremity Color: [Right:Mottled] Hair Growth on Extremity: [Right:No] Temperature of Extremity: [Right:Warm] Capillary Refill: [Right:< 3 seconds] Electronic Signature(s) Signed: 05/08/2015 5:19:21 PM By: Montey Hora Entered By: Montey Hora on 05/08/2015 14:52:09 Gatti, Anber CMarland Kitchen  ([id number]) -------------------------------------------------------------------------------- Multi Wound Chart Details Patient Name: [name], [name] Date of Service: 05/08/2015 2:45 PM Medical Record Number: [id number] Patient Account Number: [id number] Date of Birth/Sex: [birth year] (82 y.o. Female) Treating RN: Montey Hora Primary Care Physician: Ria Bush Other Clinician: Referring Physician: Ria Bush Treating Physician/Extender: Frann Rider in Treatment: 38 Vital Signs Height(in): 58 Pulse(bpm): 77 Weight(lbs): 151 Blood Pressure 137/68 (mmHg): Body Mass Index(BMI): 32 Temperature(F): 98.3 Respiratory Rate 20 (breaths/min): Photos: [1:No Photos] [N/A:N/A] Wound Location: [1:Right Lower Leg - Medial] [N/A:N/A]  Wounding Event: [1:Gradually Appeared] [N/A:N/A] Primary Etiology: [1:Venous Leg Ulcer] [N/A:N/A] Comorbid History: [1:Cataracts, Anemia, Coronary Artery Disease, Hypertension, Myocardial Infarction, Type II Diabetes, Osteoarthritis] [N/A:N/A] Date Acquired: [1:04/04/2014] [N/A:N/A] Weeks of Treatment: [1:38] [N/A:N/A] Wound Status: [1:Open] [N/A:N/A] Measurements L x W x D 4.4x0.5x0.2 [N/A:N/A] (cm) Area (cm) : [1:1.728] [N/A:N/A] Volume (cm) : [1:0.346] [N/A:N/A] % Reduction in Area: [1:70.70%] [N/A:N/A] % Reduction in Volume: 80.40% [N/A:N/A] Classification: [1:Full Thickness Without Exposed Support Structures] [N/A:N/A] HBO Classification: [1:Grade 1] [N/A:N/A] Exudate Amount: [1:Medium] [N/A:N/A] Exudate Type: [1:Serosanguineous] [N/A:N/A] Exudate Color: [1:red, brown] [N/A:N/A] Wound Margin: [1:Distinct, outline attached] [N/A:N/A] Granulation Amount: [1:Large (67-100%)] [N/A:N/A] Granulation Quality: [1:Red, Pink] [N/A:N/A] Necrotic Amount: [1:Small (1-33%)] [N/A:N/A] Exposed Structures: [N/A:N/A] Fascia: No Fat: No Tendon: No Muscle: No Joint: No Bone: No Limited to Skin Breakdown Epithelialization: None N/A  N/A Periwound Skin Texture: Edema: Yes N/A N/A Excoriation: No Induration: No Callus: No Crepitus: No Fluctuance: No Friable: No Rash: No Scarring: No Periwound Skin Moist: Yes N/A N/A Moisture: Maceration: No Dry/Scaly: No Periwound Skin Color: Erythema: Yes N/A N/A Mottled: Yes Atrophie Blanche: No Cyanosis: No Ecchymosis: No Hemosiderin Staining: No Pallor: No Rubor: No Erythema Location: Circumferential N/A N/A Temperature: No Abnormality N/A N/A Tenderness on Yes N/A N/A Palpation: Wound Preparation: Ulcer Cleansing: Other: N/A N/A soap and water Topical Anesthetic Applied: Other: lidocaine 4% Treatment Notes Electronic Signature(s) Signed: 05/08/2015 5:19:21 PM By: Montey Hora Entered By: Montey Hora on 05/08/2015 14:53:43 Banfill, [name] ([id number]) -------------------------------------------------------------------------------- [place] Details Patient Name: [name], [name] Date of Service: 05/08/2015 2:45 PM Medical Record Number: [id number] Patient Account Number: [id number] Date of Birth/Sex: 12/23/1927 (82 y.o. Female) Treating RN: Montey Hora Primary Care Physician: Ria Bush Other Clinician: Referring Physician: Ria Bush Treating Physician/Extender: Frann Rider in Treatment: 68 Active Inactive Abuse / Safety / Falls / Self Care Management Nursing Diagnoses: Potential for falls Goals: Patient will remain injury free Date Initiated: 08/15/2014 Goal Status: Active Interventions: Assess fall risk on admission and as needed Notes: Nutrition Nursing Diagnoses: Impaired glucose control: actual or potential Goals: Patient/caregiver verbalizes understanding of need to maintain therapeutic glucose control per primary care physician Date Initiated: 08/15/2014 Goal Status: Active Interventions: Assess patient nutrition upon admission and as needed per policy Notes: Orientation to the Wound Care  Program Nursing Diagnoses: Knowledge deficit related to the wound healing center program Goals: Patient/caregiver will verbalize understanding of the [place], Tasmine C. ([id number]) Date Initiated: 08/15/2014 Goal Status: Active Interventions: Provide education on orientation to the wound center Notes: Venous Leg Ulcer Nursing Diagnoses: Actual venous Insuffiency (use after diagnosis is confirmed) Goals: Patient will maintain optimal edema control Date Initiated: 08/15/2014 Goal Status: Active Interventions: Compression as ordered Treatment Activities: Test ordered outside of clinic : 05/08/2015 Notes: Wound/Skin Impairment Nursing Diagnoses: Impaired tissue integrity Goals: Patient/caregiver will verbalize understanding of skin care regimen Date Initiated: 08/15/2014 Goal Status: Active Interventions: Assess patient/caregiver ability to obtain necessary supplies Notes: Electronic Signature(s) Signed: 05/08/2015 5:19:21 PM By: Montey Hora Entered By: Montey Hora on 05/08/2015 14:52:19 Twist, [name] ([id number]) -------------------------------------------------------------------------------- Patient/Caregiver Education Details Patient Name: [name], [name] Date of Service: 05/08/2015 2:45 PM Medical Record Number: [id number] Patient Account Number: [id number] Date of Birth/Gender: 1928-03-14 (82 y.o. Female) Treating RN: Montey Hora Primary Care Physician: Ria Bush Other Clinician: Referring Physician: Ria Bush Treating Physician/Extender: Frann Rider in Treatment: 72 Education Assessment Education Provided To: Patient Education Topics Provided Wound/Skin Impairment: Handouts: Other: continue leg elevation as often as possible Methods:  Explain/Verbal Responses: State content correctly Electronic Signature(s) Signed: 05/11/2015 4:29:33 PM By: Montey Hora Entered By: Montey Hora on 05/11/2015  16:29:33 Kinderman, [name] ([id number]) -------------------------------------------------------------------------------- Wound Assessment Details Patient Name: [name], [name] Date of Service: 05/08/2015 2:45 PM Medical Record Number: [id number] Patient Account Number: [id number] Date of Birth/Sex: [birth year] (82 y.o. Female) Treating RN: Montey Hora Primary Care Physician: Ria Bush Other Clinician: Referring Physician: Ria Bush Treating Physician/Extender: Frann Rider in Treatment: 38 Wound Status Wound Number: 1 Primary Venous Leg Ulcer Etiology: Wound Location: Right Lower Leg - Medial Wound Open Wounding Event: Gradually Appeared Status: Date Acquired: 04/04/2014 Comorbid Cataracts, Anemia, Coronary Artery Weeks Of Treatment: 38 History: Disease, Hypertension, Myocardial Clustered Wound: No Infarction, Type II Diabetes, Osteoarthritis Photos Photo Uploaded By: Montey Hora on 05/08/2015 17:16:37 Wound Measurements Length: (cm) 4.4 Width: (cm) 0.5 Depth: (cm) 0.2 Area: (cm) 1.728 Volume: (cm) 0.346 % Reduction in Area: 70.7% % Reduction in Volume: 80.4% Epithelialization: None Tunneling: No Undermining: No Wound Description Full Thickness Without Exposed Foul Odor Af Classification: Support Structures Diabetic Severity Grade 1 (Wagner): Wound Margin: Distinct, outline attached Exudate Amount: Medium Exudate Type: Serosanguineous Exudate Color: red, brown ter Cleansing: No Wound Bed Early, Aaminah C. ([id number]) Granulation Amount: Large (67-100%) Exposed Structure Granulation Quality: Red, Pink Fascia Exposed: No Necrotic Amount: Small (1-33%) Fat Layer Exposed: No Necrotic Quality: Adherent Slough Tendon Exposed: No Muscle Exposed: No Joint Exposed: No Bone Exposed: No Limited to Skin Breakdown Periwound Skin Texture Texture Color No Abnormalities Noted: No No Abnormalities Noted: No Callus: No Atrophie  Blanche: No Crepitus: No Cyanosis: No Excoriation: No Ecchymosis: No Fluctuance: No Erythema: Yes Friable: No Erythema Location: Circumferential Induration: No Hemosiderin Staining: No Localized Edema: Yes Mottled: Yes Rash: No Pallor: No Scarring: No Rubor: No Moisture Temperature / Pain No Abnormalities Noted: No Temperature: No Abnormality Dry / Scaly: No Tenderness on Palpation: Yes Maceration: No Moist: Yes Wound Preparation Ulcer Cleansing: Other: soap and water, Topical Anesthetic Applied: Other: lidocaine 4%, Treatment Notes Wound #1 (Right, Medial Lower Leg) 1. Cleansed with: Cleanse wound with antibacterial soap and water 2. Anesthetic Topical Lidocaine 4% cream to wound bed prior to debridement 4. Dressing Applied: Other dressing (specify in notes) 5. Secondary Dressing Applied ABD Pad Foam 7. Secured with 2 Layer Compression System - Right Lower Extremity Notes Sinning, Derin C. ([id number]) collagen dressing application in clinic; including contact layer, fixation with steri strips, dry gauze and cover dressing. Electronic Signature(s) Signed: 05/08/2015 5:19:21 PM By: Montey Hora Entered By: Montey Hora on 05/08/2015 14:51:44 Sauser, [name] ([id number]) -------------------------------------------------------------------------------- Vitals Details Patient Name: [name], [name] Date of Service: 05/08/2015 2:45 PM Medical Record Number: [id number] Patient Account Number: [id number] Date of Birth/Sex: [birth year] (82 y.o. Female) Treating RN: Montey Hora Primary Care Physician: Ria Bush Other Clinician: Referring Physician: Ria Bush Treating Physician/Extender: Frann Rider in Treatment: 38 Vital Signs Time Taken: 14:38 Temperature (F): 98.3 Height (in): 58 Pulse (bpm): 77 Weight (lbs): 151 Respiratory Rate (breaths/min): 20 Body Mass Index (BMI): 31.6 Blood Pressure (mmHg): 137/68 Reference Range: 80  - 120 mg / dl Electronic Signature(s) Signed: 05/08/2015 5:19:21 PM By: Montey Hora Entered By: Montey Hora on 05/08/2015 14:50:42

## 2015-05-10 NOTE — Progress Notes (Signed)
WILLEEN, WICKER ([id number]) Visit Report for 05/08/2015 Chief Complaint Document Details Patient Name: [name], [name] Date of Service: 05/08/2015 2:45 PM Medical Record Number: [id number] Patient Account Number: [id number] Date of Birth/Sex: Feb 13, 1928 (82 y.o. Female) Treating RN: Montey Hora Primary Care Physician: Ria Bush Other Clinician: Referring Physician: Ria Bush Treating Physician/Extender: Frann Rider in Treatment: 61 Information Obtained from: Patient Chief Complaint Patient presents for treatment of an open ulcer due to venous insufficiency. 82 year old patient who is known to me from the [name] wound care center now comes in follow up here for a chronic venous ulceration of her right lower extremity which she's had for about 6 months. Electronic Signature(s) Signed: 05/08/2015 3:10:43 PM By: Christin Fudge MD, FACS Entered By: Christin Fudge on 05/08/2015 15:10:43 Hedtke, [name] ([id number]) -------------------------------------------------------------------------------- HPI Details Patient Name: [name], [name] Date of Service: 05/08/2015 2:45 PM Medical Record Number: [id number] Patient Account Number: [id number] Date of Birth/Sex: December 09, 1927 (82 y.o. Female) Treating RN: Montey Hora Primary Care Physician: Ria Bush Other Clinician: Referring Physician: Ria Bush Treating Physician/Extender: Frann Rider in Treatment: 30 History of Present Illness Location: ulcer in the right lower extremity for several months Quality: Patient reports experiencing a dull pain to affected area(s). Severity: Patient states wound (s) are getting better. Duration: Patient has had the wound for > 3 months prior to seeking treatment at the wound center Timing: Pain in wound is Intermittent (comes [name] goes Context: The wound appeared gradually over time Modifying Factors: Consults to this date include: vascular surgery for the  veins in December 2015 [name] she's been seeing the wound care center at [facility] for several months. Associated Signs [name] Symptoms: Patient reports having difficulty standing for long periods. HPI Description: This is an 82 yrs old female we have been following since the beginning of January for a wound on the right medial ankle. She had previously had venous ablation of the greater saphenous vein by Dr. Kellie Simmering. She came to [place] with erythema of the right leg [name] subsequently is opened in the medial aspect of the right ankle. She has not been very compliant with compression. We ordered her at juxta lite stocking, however she apparently has not been able to wear it. The area on the medial aspect of her left ankle has much less induration [name] erythema. The edema is better controlled. No evidence of infection is noted. 07/18/14; no major change in the condition of this wound. There is still surface slough [name] probably some drainage. 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 All other veins showed no evidence of DVT, superficial venous thrombosis or incompetence. however she does have manifestations of chronic venous hypertension [name] I believe we will change her dressing over to Prisma [name] a Profore light dressing. she sees her primary care doctor regularly [name] her last hemoglobin A1c has been around 7%.her other comorbidities are hypertension, CABG in 1998, peptic ulcer disease, history of echo with a ejection fraction of 55-60% [name] grade 1 diastolic dysfunction. past surgical history include coronary artery bypass graft in 1998, cholecystectomy, appendectomy, hand surgery, endovenous ablation of the saphenous vein with laser by Dr. Kellie Simmering in December 2015. 08/29/2014 - she complains of increased calf pain over the past week. removed her compression bandage [name] pain improved. no  fever or chills. Mild clear drainage. 09/04/2014 -- she has not been compliant [name]  has not done a dressing change as we had ordered last week. She always complains [name] does not keep her wrap on [name] this past week she has done the same. She has completed a course of doxycycline. 09/25/2014 -- she has tolerated her dressing all week [name] has not removed it. She says it's hurting a lot today [name] would not like any debridement. We will do so as per her wishes. she did get a appointment with the vascular surgeon [name] sees him on 6/28. GRAHAM, ZALESKI ([id number]) 10/03/2014 - She was seen by Dr. Tinnie Gens 09/30/2014. Assessment: I reviewed the previous venous duplex exam study performed in September 2015 which reveals no options for treating this venous ulcer. There is no significant superficial venous reflux to treat with laser ablation.Most recent ABI was 0.91 in the right foot with palpable dorsalis pedis pulse at 3+ Plan: patient needs to return to the [facility] wound center [name] be treated by Dr. Con Memos. No arterial or venous options available from vascular standpoint or treatment of right leg stasis ulcer.Continued dressing changes [name] compression is the treatment of choice 10/16/2014 -- today will be her first application of Apligraf. 10/23/2014 -- we are going to be able to apply her second day of Apligraf next week. She is complaining of some tenderness in the region of Achilles tendon [name] this is possibly due to her wrap. 11/06/2014 -- she did not have Apligraf last week because the wound was not looking good but she says she is feeling much better now. 11/13/2014 -- she has been doing well this week [name] she is here for her second Apligraf. 11/27/2014 -- she is here for her third application of Apligraf 12/12/2014 -- she is here for her fourth application of Apligraf. Also during these past 3 days she had an injury to her left forearm which she caught against the edge of her desk [name]  has a lacerated wound which has gone down to the subcutaneous tissue. 12/26/2014 -- she is here for a last/fifth application of Apligraf. 04/10/2015 -- the patient is here for her first application of Puraply. 04/17/2015 -- the patient is here for her second application of Puraply. 04/24/2015 -- her Puraply was not ordered for today [name] hence he would use a collagen dressing locally. 05/01/2015 -- the patient is here for her third application of Puraply. 03/06/2016 -- the patient has been doing fine [name] overall has shown much improvement [name] is here for a routine visit Electronic Signature(s) Signed: 05/08/2015 3:11:25 PM By: Christin Fudge MD, FACS Entered By: Christin Fudge on 05/08/2015 15:11:25 Lupi, [name] ([id number]) -------------------------------------------------------------------------------- Physical Exam Details Patient Name: [name], [name] Date of Service: 05/08/2015 2:45 PM Medical Record Number: [id number] Patient Account Number: [id number] Date of Birth/Sex: 23-Sep-1927 (82 y.o. Female) Treating RN: Montey Hora Primary Care Physician: Ria Bush Other Clinician: Referring Physician: Ria Bush Treating Physician/Extender: Frann Rider in Treatment: 38 Constitutional . Pulse regular. Respirations normal [name] unlabored. Afebrile. . Eyes Nonicteric. Reactive to light. Ears, Nose, Mouth, [name] Throat Lips, teeth, [name] gums WNL.Marland Kitchen Moist mucosa without lesions. Neck supple [name] nontender. No palpable supraclavicular or cervical adenopathy. Normal sized without goiter. Respiratory WNL. No retractions.. Cardiovascular Pedal Pulses WNL. No clubbing, cyanosis or edema. Lymphatic No adneopathy. No adenopathy. No adenopathy. Musculoskeletal Adexa without tenderness or enlargement.. Digits [name] nails w/o clubbing, cyanosis, infection, petechiae, ischemia, or inflammatory conditions.. Integumentary (Hair, Skin) No suspicious lesions. No crepitus or  fluctuance. No peri-wound warmth or erythema. No masses.Marland Kitchen Psychiatric  Judgement [name] insight Intact.. No evidence of depression, anxiety, or agitation.. Notes the wound continues to improve [name] there is a small open area which will be treated appropriately with collagen [name] local compression as before. Electronic Signature(s) Signed: 05/08/2015 3:12:00 PM By: Christin Fudge MD, FACS Entered By: Christin Fudge on 05/08/2015 15:12:00 Buxton, [name] ([id number]) -------------------------------------------------------------------------------- Physician Orders Details Patient Name: Molina, Merinda C. Date of Service: 05/08/2015 2:45 PM Medical Record Number: [id number] Patient Account Number: [id number] Date of Birth/Sex: 09-23-1927 (82 y.o. Female) Treating RN: Montey Hora Primary Care Physician: Ria Bush Other Clinician: Referring Physician: Ria Bush Treating Physician/Extender: Frann Rider in Treatment: 42 Verbal / Phone Orders: Yes Clinician: Montey Hora Read Back [name] Verified: Yes Diagnosis Coding Wound Cleansing Wound #1 Right,Medial Lower Leg o Cleanse wound with mild soap [name] water Anesthetic Wound #1 Right,Medial Lower Leg o Topical Lidocaine 4% cream applied to wound bed prior to debridement Skin Barriers/Peri-Wound Care Wound #1 Right,Medial Lower Leg o Skin Prep Primary Wound Dressing Wound #1 Right,Medial Lower Leg o Mepitel One - strei-strips o Other: - collagen dressing Secondary Dressing Wound #1 Right,Medial Lower Leg o ABD pad o Foam - to heel Dressing Change Frequency Wound #1 Right,Medial Lower Leg o Change dressing every week Follow-up Appointments Wound #1 Right,Medial Lower Leg o Return Appointment in 1 week. Edema Control Wound #1 Right,Medial Lower Leg o 2 Layer Compression System - Right Lower Extremity o Elevate legs to the level of the heart [name] pump ankles as often as possible Davoli, Cesilia  Loletha Grayer ([id number]) Electronic Signature(s) Signed: 05/08/2015 4:33:45 PM By: Christin Fudge MD, FACS Signed: 05/08/2015 5:19:21 PM By: Montey Hora Entered By: Montey Hora on 05/08/2015 15:01:54 Salem, Montserrat C. ([id number]) -------------------------------------------------------------------------------- Problem List Details Patient Name: Maslin, Dillon C. Date of Service: 05/08/2015 2:45 PM Medical Record Number: [id number] Patient Account Number: [id number] Date of Birth/Sex: 1927/06/22 (82 y.o. Female) Treating RN: Montey Hora Primary Care Physician: Ria Bush Other Clinician: Referring Physician: Ria Bush Treating Physician/Extender: Frann Rider in Treatment: 73 Active Problems ICD-10 Encounter Code Description Active Date Diagnosis E11.622 Type 2 diabetes mellitus with other skin ulcer 08/15/2014 Yes E11.610 Type 2 diabetes mellitus with diabetic neuropathic 08/15/2014 Yes arthropathy I87.331 Chronic venous hypertension (idiopathic) with ulcer [name] 08/15/2014 Yes inflammation of right lower extremity S41.112A Laceration without foreign body of left upper arm, initial 12/12/2014 Yes encounter Inactive Problems Resolved Problems ICD-10 Code Description Active Date Resolved Date L03.115 Cellulitis of right lower limb 08/29/2014 08/29/2014 Electronic Signature(s) Signed: 05/08/2015 3:10:32 PM By: Christin Fudge MD, FACS Entered By: Christin Fudge on 05/08/2015 15:10:32 Deloatch, [name] ([id number]) -------------------------------------------------------------------------------- Progress Note Details Patient Name: Padmanabhan, Eyana C. Date of Service: 05/08/2015 2:45 PM Medical Record Number: [id number] Patient Account Number: [id number] Date of Birth/Sex: June 27, 1927 (82 y.o. Female) Treating RN: Montey Hora Primary Care Physician: Ria Bush Other Clinician: Referring Physician: Ria Bush Treating Physician/Extender: Frann Rider in  Treatment: 2 Subjective Chief Complaint Information obtained from Patient Patient presents for treatment of an open ulcer due to venous insufficiency. 82 year old patient who is known to me from the [facility] wound care center now comes in follow up here for a chronic venous ulceration of her right lower extremity which she's had for about 6 months. History of Present Illness (HPI) The following HPI elements were documented for the patient's wound: Location: ulcer in the right lower extremity for several months Quality: Patient reports experiencing a dull pain to affected area(s). Severity: Patient states wound (s) are getting better.  Duration: Patient has had the wound for > 3 months prior to seeking treatment at the wound center Timing: Pain in wound is Intermittent (comes [name] goes Context: The wound appeared gradually over time Modifying Factors: Consults to this date include: vascular surgery for the veins in December 2015 [name] she's been seeing the wound care center at [facility] for several months. Associated Signs [name] Symptoms: Patient reports having difficulty standing for long periods. This is an 82 yrs old female we have been following since the beginning of January for a wound on the right medial ankle. She had previously had venous ablation of the greater saphenous vein by Dr. Kellie Simmering. She came to [place] with erythema of the right leg [name] subsequently is opened in the medial aspect of the right ankle. She has not been very compliant with compression. We ordered her at juxta lite stocking, however she apparently has not been able to wear it. The area on the medial aspect of her left ankle has much less induration [name] erythema. The edema is better controlled. No evidence of infection is noted. 07/18/14; no major change in the condition of this wound. There is still surface slough [name] probably some drainage. 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 All other veins showed no evidence of DVT, superficial venous thrombosis or incompetence. however she does have manifestations of chronic venous hypertension [name] I believe we will change her dressing over to Prisma [name] a Profore light dressing. she sees her primary care doctor regularly [name] her last hemoglobin A1c has been around 7%.her other comorbidities are hypertension, CABG in 1998, peptic ulcer disease, history of echo with a ejection fraction of 55-60% [name] grade 1 diastolic dysfunction. past surgical history include coronary artery bypass graft in 1998, cholecystectomy, appendectomy, hand surgery, endovenous ablation of the saphenous vein with laser by Dr. Kellie Simmering in December 2015. TENYA, CHEEVERS ([id number]) 08/29/2014 - she complains of increased calf pain over the past week. removed her compression bandage [name] pain improved. no fever or chills. Mild clear drainage. 09/04/2014 -- she has not been compliant [name] has not done a dressing change as we had ordered last week. She always complains [name] does not keep her wrap on [name] this past week she has done the same. She has completed a course of doxycycline. 09/25/2014 -- she has tolerated her dressing all week [name] has not removed it. She says it's hurting a lot today [name] would not like any debridement. We will do so as per her wishes. she did get a appointment with the vascular surgeon [name] sees him on 6/28. 10/03/2014 - She was seen by Dr. Tinnie Gens 09/30/2014. Assessment: I reviewed the previous venous duplex exam study performed in September 2015 which reveals no options for treating this venous ulcer. There is no significant superficial venous reflux to treat with laser ablation.Most recent ABI was 0.91 in the right foot with palpable dorsalis pedis pulse at 3+ Plan: patient needs to return to the [place] wound center [name] be treated by Dr. Con Memos.  No arterial or venous options available from vascular standpoint or treatment of right leg stasis ulcer.Continued dressing changes [name] compression is the treatment of choice 10/16/2014 -- today will be her first application of Apligraf. 10/23/2014 -- we are going to be able to apply her second day of Apligraf next week. She is complaining of some  tenderness in the region of Achilles tendon [name] this is possibly due to her wrap. 11/06/2014 -- she did not have Apligraf last week because the wound was not looking good but she says she is feeling much better now. 11/13/2014 -- she has been doing well this week [name] she is here for her second Apligraf. 11/27/2014 -- she is here for her third application of Apligraf 12/12/2014 -- she is here for her fourth application of Apligraf. Also during these past 3 days she had an injury to her left forearm which she caught against the edge of her desk [name] has a lacerated wound which has gone down to the subcutaneous tissue. 12/26/2014 -- she is here for a last/fifth application of Apligraf. 04/10/2015 -- the patient is here for her first application of Puraply. 04/17/2015 -- the patient is here for her second application of Puraply. 04/24/2015 -- her Puraply was not ordered for today [name] hence he would use a collagen dressing locally. 05/01/2015 -- the patient is here for her third application of Puraply. 03/06/2016 -- the patient has been doing fine [name] overall has shown much improvement [name] is here for a routine visit Objective Constitutional Pulse regular. Respirations normal [name] unlabored. Afebrile. Vitals Time Taken: 2:38 PM, Height: 58 in, Weight: 151 lbs, BMI: 31.6, Temperature: 98.3 F, Pulse: 77 bpm, Respiratory Rate: 20 breaths/min, Blood Pressure: 137/68 mmHg. Sermersheim, Jacaria C. ([id number]) Eyes Nonicteric. Reactive to light. Ears, Nose, Mouth, [name] Throat Lips, teeth, [name] gums WNL.Marland Kitchen Moist mucosa without lesions. Neck supple [name] nontender.  No palpable supraclavicular or cervical adenopathy. Normal sized without goiter. Respiratory WNL. No retractions.. Cardiovascular Pedal Pulses WNL. No clubbing, cyanosis or edema. Lymphatic No adneopathy. No adenopathy. No adenopathy. Musculoskeletal Adexa without tenderness or enlargement.. Digits [name] nails w/o clubbing, cyanosis, infection, petechiae, ischemia, or inflammatory conditions.Marland Kitchen Psychiatric Judgement [name] insight Intact.. No evidence of depression, anxiety, or agitation.. General Notes: the wound continues to improve [name] there is a small open area which will be treated appropriately with collagen [name] local compression as before. Integumentary (Hair, Skin) No suspicious lesions. No crepitus or fluctuance. No peri-wound warmth or erythema. No masses.. Wound #1 status is Open. Original cause of wound was Gradually Appeared. The wound is located on the Right,Medial Lower Leg. The wound measures 4.4cm length x 0.5cm width x 0.2cm depth; 1.728cm^2 area [name] 0.346cm^3 volume. The wound is limited to skin breakdown. There is no tunneling or undermining noted. There is a medium amount of serosanguineous drainage noted. The wound margin is distinct with the outline attached to the wound base. There is large (67-100%) red, pink granulation within the wound bed. There is a small (1-33%) amount of necrotic tissue within the wound bed including Adherent Slough. The periwound skin appearance exhibited: Localized Edema, Moist, Mottled, Erythema. The periwound skin appearance did not exhibit: Callus, Crepitus, Excoriation, Fluctuance, Friable, Induration, Rash, Scarring, Dry/Scaly, Maceration, Atrophie Blanche, Cyanosis, Ecchymosis, Hemosiderin Staining, Pallor, Rubor. The surrounding wound skin color is noted with erythema which is circumferential. Periwound temperature was noted as No Abnormality. The periwound has tenderness on palpation. [name], BOZE ([id number]) Assessment Active  Problems ICD-10 E11.622 - Type 2 diabetes mellitus with other skin ulcer E11.610 - Type 2 diabetes mellitus with diabetic neuropathic arthropathy I87.331 - Chronic venous hypertension (idiopathic) with ulcer [name] inflammation of right lower extremity S41.112A - Laceration without foreign body of left upper arm, initial encounter There has been good improvement after application of Puraply over the last 4 weeks [name] today we  will continue with collagen dressing over this much improved ulcer. We will continue with a 2 layer compression as it is all she will tolerate. He will come back [name] see as next week. Plan Wound Cleansing: Wound #1 Right,Medial Lower Leg: Cleanse wound with mild soap [name] water Anesthetic: Wound #1 Right,Medial Lower Leg: Topical Lidocaine 4% cream applied to wound bed prior to debridement Skin Barriers/Peri-Wound Care: Wound #1 Right,Medial Lower Leg: Skin Prep Primary Wound Dressing: Wound #1 Right,Medial Lower Leg: Mepitel One - strei-strips Other: - collagen dressing Secondary Dressing: Wound #1 Right,Medial Lower Leg: ABD pad Foam - to heel Dressing Change Frequency: Wound #1 Right,Medial Lower Leg: Change dressing every week Follow-up Appointments: Wound #1 Right,Medial Lower Leg: Return Appointment in 1 week. Edema Control: Wound #1 Right,Medial Lower Leg: 2 Layer Compression System - Right Lower Extremity Baine, Zurii C. ([id number]) Elevate legs to the level of the heart [name] pump ankles as often as possible There has been good improvement after application of Puraply over the last 4 weeks [name] today we will continue with collagen dressing over this much improved ulcer. We will continue with a 2 layer compression as it is all she will tolerate. He will come back [name] see as next week. Electronic Signature(s) Signed: 05/08/2015 3:16:37 PM By: Christin Fudge MD, FACS Entered By: Christin Fudge on 05/08/2015 15:16:37 Friede, [name]  ([id number]) -------------------------------------------------------------------------------- SuperBill Details Patient Name: Goracke, Evamae C. Date of Service: 05/08/2015 Medical Record Number: [id number] Patient Account Number: [id number] Date of Birth/Sex: 1927-11-22 (82 y.o. Female) Treating RN: Montey Hora Primary Care Physician: Ria Bush Other Clinician: Referring Physician: Ria Bush Treating Physician/Extender: Frann Rider in Treatment: 38 Diagnosis Coding ICD-10 Codes Code Description E11.622 Type 2 diabetes mellitus with other skin ulcer E11.610 Type 2 diabetes mellitus with diabetic neuropathic arthropathy Chronic venous hypertension (idiopathic) with ulcer [name] inflammation of right lower I87.331 extremity S41.112A Laceration without foreign body of left upper arm, initial encounter Physician Procedures CPT4: Description Modifier Quantity Code [id number] - WC PHYS LEVEL 3 - EST PT 1 ICD-10 Description Diagnosis E11.622 Type 2 diabetes mellitus with other skin ulcer I87.331 Chronic venous hypertension (idiopathic) with ulcer [name] inflammation of right  lower extremity E11.610 Type 2 diabetes mellitus with diabetic neuropathic arthropathy Electronic Signature(s) Signed: 05/08/2015 3:17:07 PM By: Christin Fudge MD, FACS Entered By: Christin Fudge on 05/08/2015 15:17:07

## 2015-05-12 ENCOUNTER — Emergency Department (HOSPITAL_COMMUNITY)
Admission: EM | Admit: 2015-05-12 | Discharge: 2015-05-13 | Disposition: A | Discharge status: Home / Self Care | Payer: Medicare Other | Attending: Emergency Medicine | Admitting: Emergency Medicine

## 2015-05-12 ENCOUNTER — Encounter (HOSPITAL_COMMUNITY): Payer: Self-pay | Admitting: Emergency Medicine

## 2015-05-12 ENCOUNTER — Telehealth: Payer: Self-pay

## 2015-05-12 ENCOUNTER — Telehealth: Payer: Self-pay | Admitting: Family Medicine

## 2015-05-12 DIAGNOSIS — R6883 Chills (without fever): Secondary | ICD-10-CM | POA: Diagnosis not present

## 2015-05-12 DIAGNOSIS — Z880 Allergy status to penicillin: Secondary | ICD-10-CM | POA: Diagnosis not present

## 2015-05-12 DIAGNOSIS — R197 Diarrhea, unspecified: Secondary | ICD-10-CM | POA: Diagnosis not present

## 2015-05-12 DIAGNOSIS — R231 Pallor: Secondary | ICD-10-CM | POA: Insufficient documentation

## 2015-05-12 DIAGNOSIS — I10 Essential (primary) hypertension: Secondary | ICD-10-CM | POA: Insufficient documentation

## 2015-05-12 DIAGNOSIS — E119 Type 2 diabetes mellitus without complications: Secondary | ICD-10-CM | POA: Insufficient documentation

## 2015-05-12 DIAGNOSIS — Z8711 Personal history of peptic ulcer disease: Secondary | ICD-10-CM | POA: Insufficient documentation

## 2015-05-12 DIAGNOSIS — Z86718 Personal history of other venous thrombosis and embolism: Secondary | ICD-10-CM | POA: Diagnosis not present

## 2015-05-12 DIAGNOSIS — Z872 Personal history of diseases of the skin and subcutaneous tissue: Secondary | ICD-10-CM | POA: Insufficient documentation

## 2015-05-12 DIAGNOSIS — F329 Major depressive disorder, single episode, unspecified: Secondary | ICD-10-CM | POA: Diagnosis not present

## 2015-05-12 DIAGNOSIS — G8929 Other chronic pain: Secondary | ICD-10-CM | POA: Diagnosis not present

## 2015-05-12 DIAGNOSIS — Z7982 Long term (current) use of aspirin: Secondary | ICD-10-CM | POA: Insufficient documentation

## 2015-05-12 DIAGNOSIS — R109 Unspecified abdominal pain: Secondary | ICD-10-CM | POA: Insufficient documentation

## 2015-05-12 DIAGNOSIS — Z87828 Personal history of other (healed) physical injury and trauma: Secondary | ICD-10-CM | POA: Insufficient documentation

## 2015-05-12 DIAGNOSIS — Z79899 Other long term (current) drug therapy: Secondary | ICD-10-CM | POA: Diagnosis not present

## 2015-05-12 DIAGNOSIS — F419 Anxiety disorder, unspecified: Secondary | ICD-10-CM | POA: Diagnosis not present

## 2015-05-12 DIAGNOSIS — Z87891 Personal history of nicotine dependence: Secondary | ICD-10-CM | POA: Diagnosis not present

## 2015-05-12 DIAGNOSIS — K219 Gastro-esophageal reflux disease without esophagitis: Secondary | ICD-10-CM | POA: Insufficient documentation

## 2015-05-12 DIAGNOSIS — Z951 Presence of aortocoronary bypass graft: Secondary | ICD-10-CM | POA: Insufficient documentation

## 2015-05-12 DIAGNOSIS — I252 Old myocardial infarction: Secondary | ICD-10-CM | POA: Insufficient documentation

## 2015-05-12 DIAGNOSIS — D649 Anemia, unspecified: Secondary | ICD-10-CM | POA: Insufficient documentation

## 2015-05-12 DIAGNOSIS — Z7984 Long term (current) use of oral hypoglycemic drugs: Secondary | ICD-10-CM | POA: Insufficient documentation

## 2015-05-12 DIAGNOSIS — R112 Nausea with vomiting, unspecified: Secondary | ICD-10-CM | POA: Insufficient documentation

## 2015-05-12 DIAGNOSIS — I2510 Atherosclerotic heart disease of native coronary artery without angina pectoris: Secondary | ICD-10-CM | POA: Insufficient documentation

## 2015-05-12 DIAGNOSIS — E785 Hyperlipidemia, unspecified: Secondary | ICD-10-CM | POA: Insufficient documentation

## 2015-05-12 LAB — COMPREHENSIVE METABOLIC PANEL
ALBUMIN: 3.8 g/dL (ref 3.5–5.0)
ALK PHOS: 111 U/L (ref 38–126)
ALT: 17 U/L (ref 14–54)
ANION GAP: 12 (ref 5–15)
AST: 24 U/L (ref 15–41)
BILIRUBIN TOTAL: 0.8 mg/dL (ref 0.3–1.2)
BUN: 24 mg/dL — AB (ref 6–20)
CALCIUM: 9.8 mg/dL (ref 8.9–10.3)
CO2: 26 mmol/L (ref 22–32)
CREATININE: 0.87 mg/dL (ref 0.44–1.00)
Chloride: 102 mmol/L (ref 101–111)
GFR calc Af Amer: >60 mL/min (ref >60)
GFR calc non Af Amer: 59 mL/min — ABNORMAL LOW (ref >60)
GLUCOSE: 205 mg/dL — AB (ref 65–99)
Potassium: 4.1 mmol/L (ref 3.5–5.1)
Sodium: 140 mmol/L (ref 135–145)
TOTAL PROTEIN: 7.6 g/dL (ref 6.5–8.1)

## 2015-05-12 LAB — CBC
HCT: 45.9 % (ref 36.0–46.0)
HEMOGLOBIN: 14.6 g/dL (ref 12.0–15.0)
MCH: 29.4 pg (ref 26.0–34.0)
MCHC: 31.8 g/dL (ref 30.0–36.0)
MCV: 92.4 fL (ref 78.0–100.0)
PLATELETS: 289 10*3/uL (ref 150–400)
RBC: 4.97 MIL/uL (ref 3.87–5.11)
RDW: 14.2 % (ref 11.5–15.5)
WBC: 17.3 10*3/uL — ABNORMAL HIGH (ref 4.0–10.5)

## 2015-05-12 LAB — LIPASE, BLOOD: LIPASE: 33 U/L (ref 11–51)

## 2015-05-12 MED ORDER — ONDANSETRON 4 MG PO TBDP: 4.0000 mg | ORAL_TABLET | Freq: Three times a day (TID) | ORAL | Status: DC | PRN

## 2015-05-12 MED ORDER — ONDANSETRON 4 MG PO TBDP
ORAL_TABLET | ORAL | Status: DC
Start: 2015-05-12 — End: 2015-05-13
  Filled 2015-05-12: qty 1

## 2015-05-12 MED ORDER — DEXLANSOPRAZOLE 30 MG PO CPDR: 1.0000 | DELAYED_RELEASE_CAPSULE | Freq: Every day | ORAL | Status: DC

## 2015-05-12 MED ORDER — SODIUM CHLORIDE 0.9 % IV BOLUS (SEPSIS)
500.0000 mL | Freq: Once | INTRAVENOUS | Status: AC
  Administered 2015-05-12: 500 mL via INTRAVENOUS

## 2015-05-12 MED ORDER — ONDANSETRON 4 MG PO TBDP
4.0000 mg | ORAL_TABLET | Freq: Once | ORAL | Status: AC
  Administered 2015-05-12: 4 mg via ORAL

## 2015-05-12 MED ORDER — ONDANSETRON HCL 4 MG/2ML IJ SOLN
4.0000 mg | Freq: Once | INTRAMUSCULAR | Status: AC
  Administered 2015-05-12: 4 mg via INTRAVENOUS
  Filled 2015-05-12: qty 2

## 2015-05-12 NOTE — Discharge Instructions (Signed)
You have been give a prescription for Zofran to help control nausea and vomiting sip on fluids frequently eat lightly for the next couple for days Take your normal medications

## 2015-05-12 NOTE — ED Notes (Signed)
Pt sts N/V/D starting today

## 2015-05-12 NOTE — Telephone Encounter (Signed)
Pt came to office with diarrhea; Dr Darnell Level spoke with pt and her son and pt will go to ED now via sons car; assisted pt to car by w/c.

## 2015-05-12 NOTE — Telephone Encounter (Signed)
Sounds like patient thinks vomiting/diarrhea stemming from nexium. But let her know GI bug is going around. Regardless, ok to restart dexilant - sent to pharmacy. Come in if vomiting/diarrhea not improving over next 24 hrs.

## 2015-05-12 NOTE — Telephone Encounter (Signed)
Patient notified and verbalized understanding. 

## 2015-05-12 NOTE — Telephone Encounter (Signed)
Vomiting with diarrhea with abdominal pain started this morning. Refused earlier appointment. Comes in at 4:55pm  Pale, weak, dehydrated.  rec ER for expedited evaluation as may need IVF rehydration.  I asked Maudie Mercury to call [facility] ER to notify pt on her way.

## 2015-05-12 NOTE — ED Notes (Signed)
Olean Ree - NP to see and assess patient before RN assessment. See NP note.

## 2015-05-12 NOTE — Telephone Encounter (Signed)
plz call tomorrow for an update.

## 2015-05-12 NOTE — Telephone Encounter (Signed)
Pt left v/m; pt having vomiting and diarrhea that started this morning; pt has gone to bathroom x 4 since 9 AM today. No fever, pt having lower abd pain that is sharp pain. Pt did not think she had the money for dexilant but pt has the money now. Spoke with pt and she is requesting dexilant to [facility]. Pt has taken immodium. Pt does not want to schedule appt.  Pt request cb ASAP.

## 2015-05-12 NOTE — ED Notes (Signed)
Attempted IV 22g, as soon as flushed with 67ml of NS IV infiltrated.  PA made aware.  Orders for PO water.

## 2015-05-12 NOTE — Telephone Encounter (Signed)
[facility]     Patient Name: Besse Fyfe Initial Comment Caller states she is having diarrhea and vomiting.   DOB: 1928/03/26      Nurse Assessment  Nurse: Luther Parody, RN, Malachy Mood Date/Time (Eastern Time): 05/12/2015 4:22:53 PM  Confirm and document reason for call. If symptomatic, describe symptoms. You must click the next button to save text entered. ---Caller states that she has been having vomiting and diarrhea since 9 this am. States that she has had 7 episodes of diarrhea and 3 episodes of vomiting. Pt also reports constant abd pain.  Has the patient traveled out of the country within the last 30 days? ---Not Applicable  Does the patient have any new or worsening symptoms? ---Yes  Will a triage be completed? ---Yes  Related visit to physician within the last 2 weeks? ---No  Does the PT have any chronic conditions? (i.e. diabetes, asthma, etc.) ---Yes  List chronic conditions. ---diabetes, htn, cardiac, cabg, stomach problems  Is this a behavioral health or substance abuse call? ---No    Guidelines     Guideline Title Affirmed Question Affirmed Notes   Diarrhea Patient sounds very sick or weak to the triager    Final Disposition User   Go to ED Now (or PCP triage) Luther Parody, RN, Malachy Mood   Comments   Pt triaged to the ed because she reported during the triage process that both her stool and vomit was black in color.   Referrals   [facility] - ED   Disagree/Comply: Comply

## 2015-05-12 NOTE — ED Provider Notes (Signed)
  Face-to-face evaluation   History: She is been ill since this afternoon with nausea, vomiting, diarrhea, multiple times each. A friend who lives with her, had diarrhea illness, 2 days ago. She's had chills without fever.  Physical exam: Elderly female alert and cooperative. Mouth has moist mucous membranes. Abdomen soft, mild diffuse tenderness.  Medical screening examination/treatment/procedure(s) were conducted as a shared visit with non-physician practitioner(s) and myself.  I personally evaluated the patient during the encounter  Daleen Bo, MD 05/14/15 [phone]

## 2015-05-12 NOTE — ED Provider Notes (Signed)
CSN: [id number]     Arrival date & time 05/12/15  1732 History   First MD Initiated Contact with Patient 05/12/15 2128     Chief Complaint  Patient presents with  . Emesis  . Diarrhea     (Consider location/radiation/quality/duration/timing/severity/associated sxs/prior Treatment) HPI Comments: Is an 82 year old female who developed nausea, vomiting and diarrhea today.  She's been in her contract with her physician throughout the day refusing an earlier appointment when she presented herself to the doctor's office at 445.  He felt that she needed to be seen in the emergency room as she appeared to be pale and shaky She has a history of hypertension, CAD, anxiety, hyperlipidemia, peptic ulcer disease, anemia, depression, type 2 diabetes, diverticulosis, GERD,chronic back pain.  PAD, chronic venous insufficiency.  Currently having a right ankle wound for the past 13 months and is followed at the wound center is currently in a [place] boot  Patient is a 82 y.o. female presenting with vomiting and diarrhea.  Emesis Severity:  Moderate Duration:  12 hours Timing:  Intermittent Quality:  Bilious material Progression:  Worsening Chronicity:  New Recent urination:  Decreased Relieved by:  Nothing Ineffective treatments:  None tried Associated symptoms: abdominal pain, chills and diarrhea   Diarrhea:    Quality:  Watery   Number of occurrences:  9   Severity:  Moderate   Duration:  12 hours   Timing:  Intermittent   Progression:  Worsening Diarrhea Associated symptoms: abdominal pain, chills and vomiting   Associated symptoms: no fever     Past Medical History  Diagnosis Date  . Hypertension   . Coronary artery disease     CABG 1998  . Anxiety   . Diastolic dysfunction   . Hyperlipidemia   . PUD (peptic ulcer disease)   . Microcytic anemia   . Depression   . Diabetes mellitus     type 2, followed by endo.  . Diverticulosis   . Hemorrhoids   . History of heart attack   .  Irritable bowel syndrome   . Chronic back pain 03/2012    lumbar DDD with herniation - s/p L S1 transforaminal ESI (10/2012 Dr. Sharlet Salina)  . Varicose veins   . Cystocele   . Hx of echocardiogram     a. Echo 10/13: mild LVH, EF 55-60%, Gr 1 diast dysfn, PASP 32  . Acute deep vein thrombosis (DVT) of distal vein of right lower extremity ([place]) 12/2013    Acute thrombus of R proximal gastrocnemius vein. Symptomatic provoked distal DVT  . Cellulitis of leg, left 01/16/2014  . PAD (peripheral artery disease) ([place]) 2013    ABI: R 0.91, L 0.83  . Chronic venous insufficiency   . Ankle wound    Past Surgical History  Procedure Laterality Date  . Tympanoplasty  1960s    R side  . Coronary artery bypass graft  1998  . Cataract surgery    . Cholecystectomy    . Appendectomy      per pt report  . Colonoscopy  02/2010    extensive diverticulosis throughout, small internal hemorrhoids, rec rpt 5 yrs (Dr. Allyn Kenner)  . Hand surgery    . Lexiscan myoview  03/2011    Small basal inferolateral and anterolateral reversible perfusion defect suggests ischemia.  EF was normal.   old infarct, no new ischemia  . Toe surgery    . Abdominal hysterectomy  1975    TAH,BSO  . Vault prolapse a & p repair  2009    Dr [facility]  . Dexa scan  09/2011    femur -0.8, forearm 0.6 => normal  . Esi  2014    L S1 transforaminal ESI x2 (Chasnis) without improvement  . Abi  2013    R 0.91, L 0.83  . Endovenous ablation saphenous vein w/ laser Left 03/2014    Kellie Simmering   Family History  Problem Relation Age of Onset  . Hypertension Mother   . Stroke Mother   . Diabetes Father   . Diabetes Sister   . Diabetes Brother   . Colon cancer Neg Hx    Social History  Substance Use Topics  . Smoking status: Former Smoker    Quit date: 03/05/1979  . Smokeless tobacco: Never Used  . Alcohol Use: No   OB History    Gravida Para Term Preterm AB TAB SAB Ectopic Multiple Living   4 4 2       2      Review of  Systems  Constitutional: Positive for chills. Negative for fever.  Respiratory: Negative for shortness of breath.   Cardiovascular: Negative for chest pain.  Gastrointestinal: Positive for nausea, vomiting, abdominal pain and diarrhea.  Skin: Positive for pallor.  All other systems reviewed and are negative.     Allergies  Amoxicillin-pot clavulanate and Metformin and related  Home Medications   Prior to Admission medications   Medication Sig Start Date End Date Taking? Authorizing Provider  ALPRAZolam Duanne Moron) 1 MG tablet Take 0.5 mg by mouth 4 (four) times daily. For anxiety   Yes Historical Provider, MD  aspirin EC 81 MG tablet Take 81 mg by mouth daily.   Yes Historical Provider, MD  Cyanocobalamin (VITAMIN B 12 PO) Take 1 tablet by mouth daily.   Yes Historical Provider, MD  Dexlansoprazole (DEXILANT) 30 MG capsule Take 1 capsule (30 mg total) by mouth daily. 05/12/15  Yes Ria Bush, MD  ferrous sulfate 325 (65 FE) MG tablet Take 1 tablet (325 mg total) by mouth daily with breakfast. 09/06/14  Yes Ria Bush, MD  furosemide (LASIX) 20 MG tablet TAKE 1 TABLET (20 MG TOTAL) BY MOUTH DAILY. FOR LEG SWELLING 05/05/15  Yes Ria Bush, MD  glucosamine-chondroitin 500-400 MG tablet Take 1 tablet by mouth daily.    Yes Historical Provider, MD  HYDROcodone-acetaminophen (NORCO/VICODIN) 5-325 MG tablet Take 0.5-1 tablets by mouth every 6 (six) hours as needed for moderate pain. Patient taking differently: Take 0.5 tablets by mouth 4 (four) times daily.  05/05/15  Yes Ria Bush, MD  loperamide (IMODIUM A-D) 2 MG tablet Take 2 mg by mouth 4 (four) times daily as needed for diarrhea or loose stools.   Yes Historical Provider, MD  nitroGLYCERIN (NITROSTAT) 0.4 MG SL tablet Place 0.4 mg under the tongue every 5 (five) minutes as needed. For chest pain May take up to 3 doses, if pain persists call 911. 03/24/11  Yes Thayer Headings, MD  nystatin-triamcinolone ointment (MYCOLOG)  APPLY 1 APPLICATION TOPICALLY 2 (TWO) TIMES DAILY. AS NEEDED FOR ITCHING Patient taking differently: APPLY 1 APPLICATION TOPICALLY 2 (TWO) TIMES DAILY AS NEEDED FOR ITCHING 04/23/15  Yes Timothy P Fontaine, MD  pioglitazone (ACTOS) 30 MG tablet Take 30 mg by mouth daily. For diabetes   Yes Historical Provider, MD  pravastatin (PRAVACHOL) 20 MG tablet TAKE 1 TABLET BY MOUTH DAILY. 01/19/15  Yes Ria Bush, MD  Probiotic Product (ALIGN) 4 MG CAPS Take 4 mg by mouth daily.   Yes  Historical Provider, MD  ramipril (ALTACE) 5 MG capsule TAKE ONE CAPSULE EVERYDAY FOR BLOOD PRESSURE 03/09/15  Yes Ria Bush, MD  traZODone (DESYREL) 150 MG tablet Take 150 mg by mouth at bedtime. For sleep   Yes Historical Provider, MD  VITAMIN E PO Take 1 tablet by mouth daily.    Yes Historical Provider, MD  ondansetron (ZOFRAN-ODT) 4 MG disintegrating tablet Take 1 tablet (4 mg total) by mouth every 8 (eight) hours as needed for nausea or vomiting. 05/12/15   Junius Creamer, NP  triamcinolone cream (KENALOG) 0.1 % Apply 1 application topically 2 (two) times daily. Patient not taking: Reported on 05/12/2015 11/12/14   Frederico Hamman Copland, MD   BP 146/68 mmHg  Pulse 89  Temp(Src) 97.5 F (36.4 C) (Oral)  Resp 15  SpO2 97% Physical Exam  Constitutional: She is oriented to person, place, and time. She appears well-developed and well-nourished.  HENT:  Head: Normocephalic.  Mouth/Throat: Mucous membranes are dry.  Eyes: Pupils are equal, round, and reactive to light.  Neck: Normal range of motion.  Cardiovascular: Normal rate and regular rhythm.   Pulmonary/Chest: Effort normal and breath sounds normal.  Abdominal: Soft. She exhibits no distension. Bowel sounds are increased. There is no tenderness.  Musculoskeletal: Normal range of motion.  Neurological: She is alert and oriented to person, place, and time.  Skin: Skin is warm. There is pallor.    ED Course  Procedures (including critical care time) Labs  Review Labs Reviewed  COMPREHENSIVE METABOLIC PANEL - Abnormal; Notable for the following:    Glucose, Bld 205 (*)    BUN 24 (*)    GFR calc non Af Amer 59 (*)    All other components within normal limits  CBC - Abnormal; Notable for the following:    WBC 17.3 (*)    All other components within normal limits  LIPASE, BLOOD  URINALYSIS, ROUTINE W REFLEX MICROSCOPIC (NOT AT [facility])    Imaging Review No results found. I have personally reviewed and evaluated these images and lab results as part of my medical decision-making.   EKG Interpretation None     Has an elevated obese, PCO2 17.5, BUN/creatinine within normal parameters.  She'll be given IV fluids and antiemetic and reevaluated IV infiltrated after 100 cc but patient did receive Zofran and is able to tolerate PO fluids she has urinated since arrival and had one very small drop of stool States she feels better  MDM   Final diagnoses:  Nausea, vomiting and diarrhea         Junius Creamer, NP 05/12/15 2352  Junius Creamer, NP 05/12/15 [place], MD 05/14/15 [phone]

## 2015-05-13 ENCOUNTER — Telehealth: Payer: Self-pay | Admitting: Family Medicine

## 2015-05-13 ENCOUNTER — Observation Stay
Admission: AD | Admit: 2015-05-13 | Discharge: 2015-05-15 | Disposition: A | Discharge status: Home / Self Care | Payer: Medicare Other | Source: Ambulatory Visit | Attending: Specialist | Admitting: Specialist

## 2015-05-13 ENCOUNTER — Ambulatory Visit (INDEPENDENT_AMBULATORY_CARE_PROVIDER_SITE_OTHER): Payer: Medicare Other | Admitting: Family Medicine

## 2015-05-13 ENCOUNTER — Encounter: Payer: Self-pay | Admitting: *Deleted

## 2015-05-13 ENCOUNTER — Encounter: Payer: Self-pay | Admitting: Family Medicine

## 2015-05-13 VITALS — BP 84/44 | HR 86 | Temp 97.5°F | Wt 149.5 lb

## 2015-05-13 DIAGNOSIS — Z87891 Personal history of nicotine dependence: Secondary | ICD-10-CM | POA: Insufficient documentation

## 2015-05-13 DIAGNOSIS — Z8719 Personal history of other diseases of the digestive system: Secondary | ICD-10-CM | POA: Diagnosis not present

## 2015-05-13 DIAGNOSIS — R197 Diarrhea, unspecified: Secondary | ICD-10-CM

## 2015-05-13 DIAGNOSIS — I10 Essential (primary) hypertension: Secondary | ICD-10-CM | POA: Insufficient documentation

## 2015-05-13 DIAGNOSIS — E876 Hypokalemia: Secondary | ICD-10-CM | POA: Diagnosis not present

## 2015-05-13 DIAGNOSIS — Z951 Presence of aortocoronary bypass graft: Secondary | ICD-10-CM | POA: Insufficient documentation

## 2015-05-13 DIAGNOSIS — F329 Major depressive disorder, single episode, unspecified: Secondary | ICD-10-CM | POA: Diagnosis not present

## 2015-05-13 DIAGNOSIS — I2510 Atherosclerotic heart disease of native coronary artery without angina pectoris: Secondary | ICD-10-CM

## 2015-05-13 DIAGNOSIS — Z79899 Other long term (current) drug therapy: Secondary | ICD-10-CM | POA: Insufficient documentation

## 2015-05-13 DIAGNOSIS — E440 Moderate protein-calorie malnutrition: Secondary | ICD-10-CM | POA: Diagnosis not present

## 2015-05-13 DIAGNOSIS — A084 Viral intestinal infection, unspecified: Secondary | ICD-10-CM | POA: Diagnosis not present

## 2015-05-13 DIAGNOSIS — Z79891 Long term (current) use of opiate analgesic: Secondary | ICD-10-CM | POA: Diagnosis not present

## 2015-05-13 DIAGNOSIS — K219 Gastro-esophageal reflux disease without esophagitis: Secondary | ICD-10-CM | POA: Diagnosis not present

## 2015-05-13 DIAGNOSIS — A0819 Acute gastroenteropathy due to other small round viruses: Secondary | ICD-10-CM | POA: Diagnosis not present

## 2015-05-13 DIAGNOSIS — Z880 Allergy status to penicillin: Secondary | ICD-10-CM | POA: Insufficient documentation

## 2015-05-13 DIAGNOSIS — E119 Type 2 diabetes mellitus without complications: Secondary | ICD-10-CM | POA: Insufficient documentation

## 2015-05-13 DIAGNOSIS — I5032 Chronic diastolic (congestive) heart failure: Secondary | ICD-10-CM | POA: Diagnosis not present

## 2015-05-13 DIAGNOSIS — I252 Old myocardial infarction: Secondary | ICD-10-CM | POA: Diagnosis not present

## 2015-05-13 DIAGNOSIS — R1110 Vomiting, unspecified: Secondary | ICD-10-CM

## 2015-05-13 DIAGNOSIS — G8929 Other chronic pain: Secondary | ICD-10-CM | POA: Insufficient documentation

## 2015-05-13 DIAGNOSIS — I739 Peripheral vascular disease, unspecified: Secondary | ICD-10-CM | POA: Insufficient documentation

## 2015-05-13 DIAGNOSIS — Z7982 Long term (current) use of aspirin: Secondary | ICD-10-CM | POA: Diagnosis not present

## 2015-05-13 DIAGNOSIS — E1149 Type 2 diabetes mellitus with other diabetic neurological complication: Secondary | ICD-10-CM

## 2015-05-13 DIAGNOSIS — D509 Iron deficiency anemia, unspecified: Secondary | ICD-10-CM | POA: Diagnosis not present

## 2015-05-13 DIAGNOSIS — Z86718 Personal history of other venous thrombosis and embolism: Secondary | ICD-10-CM | POA: Diagnosis not present

## 2015-05-13 DIAGNOSIS — R531 Weakness: Secondary | ICD-10-CM

## 2015-05-13 DIAGNOSIS — A0811 Acute gastroenteropathy due to Norwalk agent: Secondary | ICD-10-CM

## 2015-05-13 DIAGNOSIS — E860 Dehydration: Secondary | ICD-10-CM

## 2015-05-13 DIAGNOSIS — N179 Acute kidney failure, unspecified: Secondary | ICD-10-CM | POA: Diagnosis not present

## 2015-05-13 DIAGNOSIS — I9589 Other hypotension: Secondary | ICD-10-CM

## 2015-05-13 DIAGNOSIS — E1151 Type 2 diabetes mellitus with diabetic peripheral angiopathy without gangrene: Secondary | ICD-10-CM

## 2015-05-13 DIAGNOSIS — I872 Venous insufficiency (chronic) (peripheral): Secondary | ICD-10-CM | POA: Insufficient documentation

## 2015-05-13 DIAGNOSIS — E785 Hyperlipidemia, unspecified: Secondary | ICD-10-CM | POA: Insufficient documentation

## 2015-05-13 DIAGNOSIS — F419 Anxiety disorder, unspecified: Secondary | ICD-10-CM | POA: Diagnosis not present

## 2015-05-13 DIAGNOSIS — Z888 Allergy status to other drugs, medicaments and biological substances status: Secondary | ICD-10-CM | POA: Diagnosis not present

## 2015-05-13 DIAGNOSIS — Z8711 Personal history of peptic ulcer disease: Secondary | ICD-10-CM | POA: Insufficient documentation

## 2015-05-13 DIAGNOSIS — R42 Dizziness and giddiness: Secondary | ICD-10-CM | POA: Diagnosis not present

## 2015-05-13 DIAGNOSIS — I959 Hypotension, unspecified: Secondary | ICD-10-CM | POA: Diagnosis not present

## 2015-05-13 DIAGNOSIS — K589 Irritable bowel syndrome without diarrhea: Secondary | ICD-10-CM | POA: Insufficient documentation

## 2015-05-13 HISTORY — DX: Acute gastroenteropathy due to Norwalk agent: A08.11

## 2015-05-13 LAB — COMPREHENSIVE METABOLIC PANEL
ALBUMIN: 3.5 g/dL (ref 3.5–5.0)
ALK PHOS: 75 U/L (ref 38–126)
ALT: 20 U/L (ref 14–54)
ANION GAP: 11 (ref 5–15)
AST: 34 U/L (ref 15–41)
BILIRUBIN TOTAL: 0.8 mg/dL (ref 0.3–1.2)
BUN: 37 mg/dL — AB (ref 6–20)
CALCIUM: 9.1 mg/dL (ref 8.9–10.3)
CO2: 24 mmol/L (ref 22–32)
Chloride: 102 mmol/L (ref 101–111)
Creatinine, Ser: 1.58 mg/dL — ABNORMAL HIGH (ref 0.44–1.00)
GFR calc Af Amer: 33 mL/min — ABNORMAL LOW (ref >60)
GFR calc non Af Amer: 29 mL/min — ABNORMAL LOW (ref >60)
GLUCOSE: 125 mg/dL — AB (ref 65–99)
POTASSIUM: 3.8 mmol/L (ref 3.5–5.1)
SODIUM: 137 mmol/L (ref 135–145)
TOTAL PROTEIN: 6.8 g/dL (ref 6.5–8.1)

## 2015-05-13 LAB — GLUCOSE, CAPILLARY
Glucose-Capillary: 114 mg/dL — ABNORMAL HIGH (ref 65–99)
Glucose-Capillary: 113 mg/dL — ABNORMAL HIGH (ref 65–99)
Glucose-Capillary: 119 mg/dL — ABNORMAL HIGH (ref 65–99)

## 2015-05-13 LAB — CBC
HEMATOCRIT: 39.8 % (ref 35.0–47.0)
HEMOGLOBIN: 13.3 g/dL (ref 12.0–16.0)
MCH: 29.9 pg (ref 26.0–34.0)
MCHC: 33.5 g/dL (ref 32.0–36.0)
MCV: 89.3 fL (ref 80.0–100.0)
Platelets: 232 10*3/uL (ref 150–440)
RBC: 4.45 MIL/uL (ref 3.80–5.20)
RDW: 14.4 % (ref 11.5–14.5)
WBC: 11.2 10*3/uL — AB (ref 3.6–11.0)

## 2015-05-13 LAB — PROTIME-INR
INR: 1.14
Prothrombin Time: 14.8 seconds (ref 11.4–15.0)

## 2015-05-13 MED ORDER — ONDANSETRON HCL 4 MG/2ML IJ SOLN: 4.0000 mg | Freq: Four times a day (QID) | INTRAMUSCULAR | Status: DC | PRN

## 2015-05-13 MED ORDER — ACETAMINOPHEN 650 MG RE SUPP
650.0000 mg | Freq: Four times a day (QID) | RECTAL | Status: DC | PRN
  Filled 2015-05-13: qty 1

## 2015-05-13 MED ORDER — INSULIN ASPART 100 UNIT/ML ~~LOC~~ SOLN
0.0000 [IU] | Freq: Three times a day (TID) | SUBCUTANEOUS | Status: DC
  Administered 2015-05-14: 2 [IU] via SUBCUTANEOUS
  Filled 2015-05-13: qty 2

## 2015-05-13 MED ORDER — INSULIN ASPART 100 UNIT/ML ~~LOC~~ SOLN: 0.0000 [IU] | Freq: Every day | SUBCUTANEOUS | Status: DC

## 2015-05-13 MED ORDER — VITAMIN E 180 MG (400 UNIT) PO CAPS
400.0000 [IU] | ORAL_CAPSULE | Freq: Every day | ORAL | Status: DC
  Administered 2015-05-14 – 2015-05-15 (×2): 400 [IU] via ORAL
  Filled 2015-05-13 (×4): qty 1

## 2015-05-13 MED ORDER — GLUCOSAMINE-CHONDROITIN 500-400 MG PO TABS: 1.0000 | ORAL_TABLET | Freq: Every day | ORAL | Status: DC

## 2015-05-13 MED ORDER — SODIUM CHLORIDE 0.9 % IV SOLN
INTRAVENOUS | Status: DC
Start: 2015-05-13 — End: 2015-05-15
  Administered 2015-05-13 – 2015-05-14 (×4): via INTRAVENOUS

## 2015-05-13 MED ORDER — ENOXAPARIN SODIUM 40 MG/0.4ML ~~LOC~~ SOLN
40.0000 mg | SUBCUTANEOUS | Status: DC
  Administered 2015-05-13: 40 mg via SUBCUTANEOUS
  Filled 2015-05-13: qty 0.4

## 2015-05-13 MED ORDER — CYANOCOBALAMIN 500 MCG PO TABS
1000.0000 ug | ORAL_TABLET | Freq: Every day | ORAL | Status: DC
  Administered 2015-05-14: 1000 ug via ORAL
  Filled 2015-05-13: qty 2

## 2015-05-13 MED ORDER — PRAVASTATIN SODIUM 20 MG PO TABS
20.0000 mg | ORAL_TABLET | Freq: Every day | ORAL | Status: DC
  Administered 2015-05-13 – 2015-05-14 (×2): 20 mg via ORAL
  Filled 2015-05-13 (×2): qty 1

## 2015-05-13 MED ORDER — ALPRAZOLAM 0.5 MG PO TABS
0.5000 mg | ORAL_TABLET | Freq: Four times a day (QID) | ORAL | Status: DC | PRN
  Administered 2015-05-13 – 2015-05-14 (×4): 0.5 mg via ORAL
  Filled 2015-05-13 (×4): qty 1

## 2015-05-13 MED ORDER — ASPIRIN EC 81 MG PO TBEC
81.0000 mg | DELAYED_RELEASE_TABLET | Freq: Every day | ORAL | Status: DC
  Administered 2015-05-14 – 2015-05-15 (×2): 81 mg via ORAL
  Filled 2015-05-13 (×3): qty 1

## 2015-05-13 MED ORDER — RISAQUAD PO CAPS
1.0000 | ORAL_CAPSULE | Freq: Every day | ORAL | Status: DC
  Administered 2015-05-14 – 2015-05-15 (×2): 1 via ORAL
  Filled 2015-05-13 (×3): qty 1

## 2015-05-13 MED ORDER — ACETAMINOPHEN 325 MG PO TABS: 650.0000 mg | ORAL_TABLET | Freq: Four times a day (QID) | ORAL | Status: DC | PRN

## 2015-05-13 MED ORDER — ONDANSETRON HCL 4 MG PO TABS: 4.0000 mg | ORAL_TABLET | Freq: Four times a day (QID) | ORAL | Status: DC | PRN

## 2015-05-13 MED ORDER — TRAZODONE HCL 50 MG PO TABS
150.0000 mg | ORAL_TABLET | Freq: Every day | ORAL | Status: DC
  Administered 2015-05-14 (×2): 150 mg via ORAL
  Filled 2015-05-13 (×2): qty 1

## 2015-05-13 MED ORDER — PANTOPRAZOLE SODIUM 40 MG PO TBEC
40.0000 mg | DELAYED_RELEASE_TABLET | Freq: Every day | ORAL | Status: DC
  Administered 2015-05-14 – 2015-05-15 (×2): 40 mg via ORAL
  Filled 2015-05-13 (×2): qty 1

## 2015-05-13 MED ORDER — NITROGLYCERIN 0.4 MG SL SUBL: 0.4000 mg | SUBLINGUAL_TABLET | SUBLINGUAL | Status: DC | PRN

## 2015-05-13 NOTE — Progress Notes (Signed)
BP 84/44 mmHg  Pulse 86  Temp(Src) 97.5 F (36.4 C) (Oral)  Wt 149 lb 8 oz (67.813 kg)  SpO2 92%   CC: vomiting/diarrhea  Subjective:    [name], female    DOB: [birth year], 82 y.o.   MRN: [id number]  HPI: [name] is a 82 y.o. female presenting on 05/13/2015 for Emesis   Sudden onset vomiting/diarrhea yesterday morning. Presented to our office at 4:55pm - concern for dehydration so sent to ER, waited 4 hours to be seen. Labs stable. Unable to start IV. Given zofran and sent home. Was tolerating PO.   Has been taking immodium.  + sick contact at home - Scrubby with diarrhea and now other friend at home with diarrhea.   Yesterday stool x10+, vomited x3. Today has not vomited and not really nauseated. 2 loose stools today. Denies chest pain dyspnea, palpitations. No appetite. No fevers. + headache.  No recent travel or new foods.   Relevant past medical, surgical, family and social history reviewed and updated as indicated. Interim medical history since our last visit reviewed. Allergies and medications reviewed and updated. Current Outpatient Prescriptions on File Prior to Visit  Medication Sig  . ALPRAZolam (XANAX) 1 MG tablet Take 0.5 mg by mouth 4 (four) times daily. For anxiety  . aspirin EC 81 MG tablet Take 81 mg by mouth daily.  . Cyanocobalamin (VITAMIN B 12 PO) Take 1 tablet by mouth daily.  Marland Kitchen Dexlansoprazole (DEXILANT) 30 MG capsule Take 1 capsule (30 mg total) by mouth daily.  . ferrous sulfate 325 (65 FE) MG tablet Take 1 tablet (325 mg total) by mouth daily with breakfast.  . furosemide (LASIX) 20 MG tablet TAKE 1 TABLET (20 MG TOTAL) BY MOUTH DAILY. FOR LEG SWELLING  . glucosamine-chondroitin 500-400 MG tablet Take 1 tablet by mouth daily.   Marland Kitchen HYDROcodone-acetaminophen (NORCO/VICODIN) 5-325 MG tablet Take 0.5-1 tablets by mouth every 6 (six) hours as needed for moderate pain. (Patient taking differently: Take 0.5 tablets by mouth 4 (four)  times daily. )  . loperamide (IMODIUM A-D) 2 MG tablet Take 2 mg by mouth 4 (four) times daily as needed for diarrhea or loose stools.  . nitroGLYCERIN (NITROSTAT) 0.4 MG SL tablet Place 0.4 mg under the tongue every 5 (five) minutes as needed. For chest pain May take up to 3 doses, if pain persists call 911.  . nystatin-triamcinolone ointment (MYCOLOG) APPLY 1 APPLICATION TOPICALLY 2 (TWO) TIMES DAILY. AS NEEDED FOR ITCHING (Patient taking differently: APPLY 1 APPLICATION TOPICALLY 2 (TWO) TIMES DAILY AS NEEDED FOR ITCHING)  . ondansetron (ZOFRAN-ODT) 4 MG disintegrating tablet Take 1 tablet (4 mg total) by mouth every 8 (eight) hours as needed for nausea or vomiting.  . pioglitazone (ACTOS) 30 MG tablet Take 30 mg by mouth daily. For diabetes  . pravastatin (PRAVACHOL) 20 MG tablet TAKE 1 TABLET BY MOUTH DAILY.  . Probiotic Product (ALIGN) 4 MG CAPS Take 4 mg by mouth daily.  . ramipril (ALTACE) 5 MG capsule TAKE ONE CAPSULE EVERYDAY FOR BLOOD PRESSURE  . traZODone (DESYREL) 150 MG tablet Take 150 mg by mouth at bedtime. For sleep  . VITAMIN E PO Take 1 tablet by mouth daily.   Marland Kitchen triamcinolone cream (KENALOG) 0.1 % Apply 1 application topically 2 (two) times daily. (Patient not taking: Reported on 05/12/2015)   No current facility-administered medications on file prior to visit.   Past Medical History  Diagnosis Date  . Hypertension   .  Coronary artery disease     CABG 1998  . Anxiety   . Diastolic dysfunction   . Hyperlipidemia   . PUD (peptic ulcer disease)   . Microcytic anemia   . Depression   . Diabetes mellitus     type 2, followed by endo.  . Diverticulosis   . Hemorrhoids   . History of heart attack   . Irritable bowel syndrome   . Chronic back pain 03/2012    lumbar DDD with herniation - s/p L S1 transforaminal ESI (10/2012 Dr. Sharlet Salina)  . Varicose veins   . Cystocele   . Hx of echocardiogram     a. Echo 10/13: mild LVH, EF 55-60%, Gr 1 diast dysfn, PASP 32  . Acute deep  vein thrombosis (DVT) of distal vein of right lower extremity ([place]) 12/2013    Acute thrombus of R proximal gastrocnemius vein. Symptomatic provoked distal DVT  . Cellulitis of leg, left 01/16/2014  . PAD (peripheral artery disease) ([place]) 2013    ABI: R 0.91, L 0.83  . Chronic venous insufficiency   . Ankle wound     Past Surgical History  Procedure Laterality Date  . Tympanoplasty  1960s    R side  . Coronary artery bypass graft  1998  . Cataract surgery    . Cholecystectomy    . Appendectomy      per pt report  . Colonoscopy  02/2010    extensive diverticulosis throughout, small internal hemorrhoids, rec rpt 5 yrs (Dr. Allyn Kenner)  . Hand surgery    . Lexiscan myoview  03/2011    Small basal inferolateral and anterolateral reversible perfusion defect suggests ischemia.  EF was normal.   old infarct, no new ischemia  . Toe surgery    . Abdominal hysterectomy  1975    TAH,BSO  . Vault prolapse a & p repair  2009    Dr [facility]  . Dexa scan  09/2011    femur -0.8, forearm 0.6 => normal  . Esi  2014    L S1 transforaminal ESI x2 (Chasnis) without improvement  . Abi  2013    R 0.91, L 0.83  . Endovenous ablation saphenous vein w/ laser Left 03/2014    Kellie Simmering  . Cardiovascular stress test  2015    low risk myoview ([place])   Review of Systems Per HPI unless specifically indicated in ROS section     Objective:    BP 84/44 mmHg  Pulse 86  Temp(Src) 97.5 F (36.4 C) (Oral)  Wt 149 lb 8 oz (67.813 kg)  SpO2 92%  Wt Readings from Last 3 Encounters:  05/13/15 149 lb 8 oz (67.813 kg)  05/04/15 156 lb (70.761 kg)  04/13/15 152 lb (68.947 kg)    Physical Exam  Constitutional: She appears well-developed and well-nourished.  Chronically ill appearing. Weak but looks better than yesterday.   HENT:  Mildly moist mm  Cardiovascular: Normal rate, regular rhythm, normal heart sounds and intact distal pulses.   No murmur heard. Pulmonary/Chest: Effort normal and  breath sounds normal. No respiratory distress. She has no wheezes. She has no rales.  Abdominal: Soft. Normal appearance and bowel sounds are normal. She exhibits no distension and no mass. There is no hepatosplenomegaly. There is tenderness (mild) in the epigastric area and suprapubic area. There is no rebound, no guarding and no CVA tenderness.  Skin: Skin is warm and dry.  Decreased turgor  Nursing note and vitals reviewed.  Results for orders  placed or performed during the hospital encounter of 05/12/15  Lipase, blood  Result Value Ref Range   Lipase 33 11 - 51 U/L  Comprehensive metabolic panel  Result Value Ref Range   Sodium 140 135 - 145 mmol/L   Potassium 4.1 3.5 - 5.1 mmol/L   Chloride 102 101 - 111 mmol/L   CO2 26 22 - 32 mmol/L   Glucose, Bld 205 (H) 65 - 99 mg/dL   BUN 24 (H) 6 - 20 mg/dL   Creatinine, Ser 0.87 0.44 - 1.00 mg/dL   Calcium 9.8 8.9 - 10.3 mg/dL   Total Protein 7.6 6.5 - 8.1 g/dL   Albumin 3.8 3.5 - 5.0 g/dL   AST 24 15 - 41 U/L   ALT 17 14 - 54 U/L   Alkaline Phosphatase 111 38 - 126 U/L   Total Bilirubin 0.8 0.3 - 1.2 mg/dL   GFR calc non Af Amer 59 (L) >60 mL/min   GFR calc Af Amer >60 >60 mL/min   Anion gap 12 5 - 15  CBC  Result Value Ref Range   WBC 17.3 (H) 4.0 - 10.5 K/uL   RBC 4.97 3.87 - 5.11 MIL/uL   Hemoglobin 14.6 12.0 - 15.0 g/dL   HCT 45.9 36.0 - 46.0 %   MCV 92.4 78.0 - 100.0 fL   MCH 29.4 26.0 - 34.0 pg   MCHC 31.8 30.0 - 36.0 g/dL   RDW 14.2 11.5 - 15.5 %   Platelets 289 150 - 400 K/uL      Assessment & Plan:   Problem List Items Addressed This Visit    Weakness   Vomiting and diarrhea - Primary    Anticipate viral gastroenteritis leading to dehydration with hypotension. With low blood pressures and comorbidities I strongly advised patient needed to go to ER for IVF. Pt refuses to go despite multiple attempts to convince her including discussion that she is not safe to go home. Given refusing ER eval, discussed possible but  less ideal direct admission - pt finally agrees to this. Discussed with hospitalist Dr Mindi Slicker who agrees to direct admission under observation status.  Hold lasix and ramipril until she is feeling better.      Type 2 diabetes mellitus with neurological manifestations, controlled ([place])   Hypotension   Diabetes mellitus type 2 with peripheral artery disease ([place])   Coronary artery disease   Anxiety       Follow up plan: Return if symptoms worsen or fail to improve.

## 2015-05-13 NOTE — Telephone Encounter (Signed)
Pt said she is not going to ED; spoke with Dr Darnell Level and he will see pt at 12:15 today; if pt cannot wait to be seen pt should go to ED; Dr Darnell Level will see pt and evaluate and may still have to send pt to ED; pt voiced understanding. Someone is with pt so if needs to go to ED she will be able to go.

## 2015-05-13 NOTE — Progress Notes (Signed)
PHARMACIST - PHYSICIAN ORDER COMMUNICATION  CONCERNING: P&T Medication Policy on Herbal Medications  DESCRIPTION:  This patient's order for:  Glucosamine-chondroitin  has been noted.  This product(s) is classified as an "herbal" or natural product. Due to a lack of definitive safety studies or FDA approval, nonstandard manufacturing practices, plus the potential risk of unknown drug-drug interactions while on inpatient medications, the Pharmacy and Therapeutics Committee does not permit the use of "herbal" or natural products of this type within [place].   ACTION TAKEN: The pharmacy department is unable to verify this order at this time and your patient has been informed of this safety policy. Please reevaluate patient's clinical condition at discharge and address if the herbal or natural product(s) should be resumed at that time.   

## 2015-05-13 NOTE — Telephone Encounter (Signed)
Still sick. Coming in today.

## 2015-05-13 NOTE — Progress Notes (Signed)
Pre visit review using our clinic review tool, if applicable. No additional management support is needed unless otherwise documented below in the visit note. 

## 2015-05-13 NOTE — H&P (Signed)
[place] [facility] [place] NAME: [name]    MR#:  [id number]  DATE OF BIRTH:  1927/07/15  DATE OF ADMISSION:  05/13/2015  PRIMARY CARE PHYSICIAN: Ria Bush, MD   REQUESTING/REFERRING PHYSICIAN: Dr. Ria Bush  CHIEF COMPLAINT:  Nausea, vomiting, diarrhea.  HISTORY OF PRESENT ILLNESS:  [name]  is a 82 y.o. female with a known history of hypertension, coronary disease status post bypass, anxiety, history of CHF with diastolic dysfunction, hyperlipidemia, peptic ulcer disease, diabetes type 2 without complication, history of previous DVT, IBS who presents to the hospital due to nausea vomiting and diarrhea ongoing for the past 3-4 days. Patient says her diarrhea is loose and watery nature without any blood. She also admits to vomiting which is clear nonbloody in nature and associated with the food and liquid that patient attempts to eat.  She denies any abdominal pain, fever, chills or any recent sick contacts. She went to see her primary care physician today and was complaining of dizziness and was noted to be hypotensive with systolic blood pressures in the 90s. Her primary care physician called hospitalist services for direct admission.  PAST MEDICAL HISTORY:   Past Medical History  Diagnosis Date  . Hypertension   . Coronary artery disease     CABG 1998  . Anxiety   . Diastolic dysfunction   . Hyperlipidemia   . PUD (peptic ulcer disease)   . Microcytic anemia   . Depression   . Diabetes mellitus     type 2, followed by endo.  . Diverticulosis   . Hemorrhoids   . History of heart attack   . Irritable bowel syndrome   . Chronic back pain 03/2012    lumbar DDD with herniation - s/p L S1 transforaminal ESI (10/2012 Dr. Sharlet Salina)  . Varicose veins   . Cystocele   . Hx of echocardiogram     a. Echo 10/13: mild LVH, EF 55-60%, Gr 1 diast dysfn, PASP 32  . Acute deep vein thrombosis (DVT) of distal vein of right  lower extremity ([place]) 12/2013    Acute thrombus of R proximal gastrocnemius vein. Symptomatic provoked distal DVT  . Cellulitis of leg, left 01/16/2014  . PAD (peripheral artery disease) ([place]) 2013    ABI: R 0.91, L 0.83  . Chronic venous insufficiency   . Ankle wound     PAST SURGICAL HISTORY:   Past Surgical History  Procedure Laterality Date  . Tympanoplasty  1960s    R side  . Coronary artery bypass graft  1998  . Cataract surgery    . Cholecystectomy    . Appendectomy      per pt report  . Colonoscopy  02/2010    extensive diverticulosis throughout, small internal hemorrhoids, rec rpt 5 yrs (Dr. Allyn Kenner)  . Hand surgery    . Lexiscan myoview  03/2011    Small basal inferolateral and anterolateral reversible perfusion defect suggests ischemia.  EF was normal.   old infarct, no new ischemia  . Toe surgery    . Abdominal hysterectomy  1975    TAH,BSO  . Vault prolapse a & p repair  2009    Dr [facility] hospital  . Dexa scan  09/2011    femur -0.8, forearm 0.6 => normal  . Esi  2014    L S1 transforaminal ESI x2 ([place]) without improvement  . Abi  2013    R 0.91, L 0.83  . Endovenous  ablation saphenous vein w/ laser Left 03/2014    Kellie Simmering  . Cardiovascular stress test  2015    low risk myoview Clinical research associate)    SOCIAL HISTORY:   Social History  Substance Use Topics  . Smoking status: Former Smoker    Quit date: 03/05/1979  . Smokeless tobacco: Never Used  . Alcohol Use: No    FAMILY HISTORY:   Family History  Problem Relation Age of Onset  . Hypertension Mother   . Stroke Mother   . Diabetes Father   . Diabetes Sister   . Diabetes Brother   . Colon cancer Neg Hx     DRUG ALLERGIES:   Allergies  Allergen Reactions  . Amoxicillin-Pot Clavulanate Other (See Comments)    Reaction:  Unknown   . Metformin And Related Diarrhea    REVIEW OF SYSTEMS:   Review of Systems  Constitutional: Negative for fever and weight loss.  HENT: Negative for  congestion, nosebleeds and tinnitus.   Eyes: Negative for blurred vision, double vision and redness.  Respiratory: Negative for cough, hemoptysis and shortness of breath.   Cardiovascular: Negative for chest pain, orthopnea, leg swelling and PND.  Gastrointestinal: Positive for nausea, vomiting and diarrhea. Negative for abdominal pain and melena.  Genitourinary: Negative for dysuria, urgency and hematuria.  Musculoskeletal: Negative for joint pain and falls.  Neurological: Positive for dizziness and weakness. Negative for tingling, sensory change, focal weakness, seizures and headaches.  Endo/Heme/Allergies: Negative for polydipsia. Does not bruise/bleed easily.  Psychiatric/Behavioral: Negative for depression and memory loss. The patient is not nervous/anxious.     MEDICATIONS AT HOME:   Prior to Admission medications   Medication Sig Start Date End Date Taking? Authorizing Provider  acidophilus (RISAQUAD) CAPS capsule Take 1 capsule by mouth daily.   Yes Historical Provider, MD  ALPRAZolam Duanne Moron) 1 MG tablet Take 0.5 mg by mouth 4 (four) times daily as needed for anxiety.    Yes Historical Provider, MD  aspirin EC 81 MG tablet Take 81 mg by mouth daily.   Yes Historical Provider, MD  Dexlansoprazole (DEXILANT) 30 MG capsule Take 1 capsule (30 mg total) by mouth daily. 05/12/15  Yes Ria Bush, MD  ferrous sulfate 325 (65 FE) MG tablet Take 1 tablet (325 mg total) by mouth daily with breakfast. 09/06/14  Yes Ria Bush, MD  furosemide (LASIX) 20 MG tablet Take 20 mg by mouth daily.   Yes Historical Provider, MD  glucosamine-chondroitin 500-400 MG tablet Take 1 tablet by mouth daily.    Yes Historical Provider, MD  HYDROcodone-acetaminophen (NORCO/VICODIN) 5-325 MG tablet Take 0.5-1 tablets by mouth every 6 (six) hours as needed for moderate pain. 05/05/15  Yes Ria Bush, MD  loperamide (IMODIUM) 2 MG capsule Take 2 mg by mouth 4 (four) times daily as needed for diarrhea or  loose stools.   Yes Historical Provider, MD  nitroGLYCERIN (NITROSTAT) 0.4 MG SL tablet Place 0.4 mg under the tongue every 5 (five) minutes as needed for chest pain.    Yes Thayer Headings, MD  nystatin-triamcinolone ointment [facility]) Apply 1 application topically 2 (two) times daily as needed (for itching).   Yes Historical Provider, MD  ondansetron (ZOFRAN-ODT) 4 MG disintegrating tablet Take 1 tablet (4 mg total) by mouth every 8 (eight) hours as needed for nausea or vomiting. 05/12/15  Yes Junius Creamer, NP  pioglitazone (ACTOS) 30 MG tablet Take 30 mg by mouth every evening.    Yes Historical Provider, MD  pravastatin (PRAVACHOL) 20 MG tablet  Take 20 mg by mouth at bedtime.   Yes Historical Provider, MD  ramipril (ALTACE) 5 MG capsule Take 5 mg by mouth every evening.    Yes Historical Provider, MD  traZODone (DESYREL) 150 MG tablet Take 150 mg by mouth at bedtime.    Yes Historical Provider, MD  vitamin B-12 (CYANOCOBALAMIN) 1000 MCG tablet Take 1,000 mcg by mouth daily.   Yes Historical Provider, MD  vitamin E 400 UNIT capsule Take 400 Units by mouth daily.   Yes Historical Provider, MD      VITAL SIGNS:  Blood pressure 95/47, pulse 80, temperature 98.3 F (36.8 C), temperature source Oral, resp. rate 17, height 4\' 9"  (1.448 m), weight 67.302 kg (148 lb 6 oz), SpO2 96 %.  PHYSICAL EXAMINATION:  Physical Exam  GENERAL:  82 y.o.-year-old patient lying in the bed with no acute distress.  EYES: Pupils equal, round, reactive to light and accommodation. No scleral icterus. Extraocular muscles intact.  HEENT: Head atraumatic, normocephalic. Oropharynx and nasopharynx clear. No oropharyngeal erythema, moist oral mucosa  NECK:  Supple, no jugular venous distention. No thyroid enlargement, no tenderness.  LUNGS: Normal breath sounds bilaterally, no wheezing, rales, rhonchi. No use of accessory muscles of respiration.  CARDIOVASCULAR: S1, S2 RRR. No murmurs, rubs, gallops, clicks.  ABDOMEN: Soft,  nontender, nondistended. Bowel sounds present. No organomegaly or mass.  EXTREMITIES: No pedal edema, cyanosis, or clubbing. + 2 pedal & radial pulses b/l.   NEUROLOGIC: Cranial nerves II through XII are intact. No focal Motor or sensory deficits appreciated b/l PSYCHIATRIC: The patient is alert and oriented x 3. Good affect.  SKIN: No obvious rash, lesion, or ulcer.   LABORATORY PANEL:   CBC  Recent Labs Lab 05/12/15 1808  WBC 17.3*  HGB 14.6  HCT 45.9  PLT 289   ------------------------------------------------------------------------------------------------------------------  Chemistries   Recent Labs Lab 05/12/15 1808  NA 140  K 4.1  CL 102  CO2 26  GLUCOSE 205*  BUN 24*  CREATININE 0.87  CALCIUM 9.8  AST 24  ALT 17  ALKPHOS 111  BILITOT 0.8   ------------------------------------------------------------------------------------------------------------------  Cardiac Enzymes No results for input(s): TROPONINI in the last 168 hours. ------------------------------------------------------------------------------------------------------------------  RADIOLOGY:  No results found.   IMPRESSION AND PLAN:   81 y.o. female with a known history of hypertension, coronary disease status post bypass, anxiety, history of CHF with diastolic dysfunction, hyperlipidemia, peptic ulcer disease, diabetes type 2 without complication, history of previous DVT, IBS who presents to the hospital due to nausea vomiting and diarrhea ongoing for the past 3-4 days.  #1 viral gastroenteritis-this is the cause of patient's ongoing nausea vomiting and diarrhea. -Continue supportive care with IV fluids, antiemetics. Place on clear liquid diet. -Check stool for comprehensive culture  #2 dehydration-result of the underlying gastroenteritis.  -hold antihypertensives. Hydrate with IV fluids  #3 hypotension-due to dehydration and underlying gastroenteritis. -Aggressively hydrated with IV fluids  and follow hemodynamics.  #4 diabetes type 2 without complication-hold Actos. Sliding scale insulin.  #5 anxiety-continue as needed Xanax.  #6 GERD-continue Protonix.  #7 hyperlipidemia-continue Pravachol.  All the records are reviewed and case discussed with ED provider. Management plans discussed with the patient, family and they are in agreement.  CODE STATUS: Full  TOTAL TIME TAKING CARE OF THIS PATIENT: 45 minutes.    Henreitta Leber M.D on 05/13/2015 at 4:39 PM  Between 7am to 6pm - Pager - [phone]  After 6pm go to [email or web address] - password [email or web address] [facility]  Office  [phone]  CC: Primary care physician; Ria Bush, MD

## 2015-05-13 NOTE — Assessment & Plan Note (Signed)
Anticipate viral gastroenteritis leading to dehydration with hypotension. With low blood pressures and comorbidities I strongly advised patient needed to go to ER for IVF. Pt refuses to go despite multiple attempts to convince her including discussion that she is not safe to go home. Given refusing ER eval, discussed possible but less ideal direct admission - pt finally agrees to this. Discussed with hospitalist Dr Mindi Slicker who agrees to direct admission under observation status.  Hold lasix and ramipril until she is feeling better.

## 2015-05-13 NOTE — Telephone Encounter (Signed)
Patient Name: [name] DOB: October 24, 1927 Initial Comment Caller stated she has diarrhea along with abdominal pain and she is feeling weak. Nurse Assessment Nurse: [name], RN, [name] Date/Time (Eastern Time): 05/13/2015 9:34:39 AM Confirm and document reason for call. If symptomatic, describe symptoms. You must click the next button to save text entered. ---Caller states she developed diarrhea 2 days ago (10 episodes in the past 24 hours). She developed lower abdominal pain 2 days ago that doesn't go away after an episode of diarrhea (rated as a 5 on the 1 to 10 scale). She feels weak. No fever. Has the patient traveled out of the country within the last 30 days? ---No Does the patient have any new or worsening symptoms? ---Yes Will a triage be completed? ---Yes Related visit to physician within the last 2 weeks? ---Yes Does the PT have any chronic conditions? (i.e. diabetes, asthma, etc.) ---Yes List chronic conditions. ---Arthritis Is this a behavioral health or substance abuse call? ---No Guidelines Guideline Title Affirmed Question Affirmed Notes Abdominal Pain - Female Shock suspected (e.g., cold/pale/clammy skin, too weak to stand, low BP, rapid pulse) Final Disposition User Call EMS 911 Now [place], RN, [employer] declined the Call 911 disposition. She states she went to the ER last night and they released her. Called the office backline and notified Melissa. Transferred to and [place] notified her. Connected [place] and [place] together. Referrals GO TO FACILITY REFUSED Disagree/Comply: Disagree Disagree/Comply Reason: Disagree with instructions

## 2015-05-13 NOTE — Telephone Encounter (Addendum)
Seen today. 

## 2015-05-13 NOTE — Patient Instructions (Addendum)
I do think you have viral gastroenteritis or stomach flu.  STOP ramipril and lasix until feeling better. With low blood pressure I have talked with the hospitalist who will see you in the hospital for likely IV fluids.  Go to [place] for direct admission.  Viral Gastroenteritis Viral gastroenteritis is also known as stomach flu. This condition affects the stomach and intestinal tract. It can cause sudden diarrhea and vomiting. The illness typically lasts 3 to 8 days. Most people develop an immune response that eventually gets rid of the virus. While this natural response develops, the virus can make you quite ill. CAUSES  Many different viruses can cause gastroenteritis, such as rotavirus or noroviruses. You can catch one of these viruses by consuming contaminated food or water. You may also catch a virus by sharing utensils or other personal items with an infected person or by touching a contaminated surface. SYMPTOMS  The most common symptoms are diarrhea and vomiting. These problems can cause a severe loss of body fluids (dehydration) and a body salt (electrolyte) imbalance. Other symptoms may include:  Fever.  Headache.  Fatigue.  Abdominal pain. DIAGNOSIS  Your caregiver can usually diagnose viral gastroenteritis based on your symptoms and a physical exam. A stool sample may also be taken to test for the presence of viruses or other infections. TREATMENT  This illness typically goes away on its own. Treatments are aimed at rehydration. The most serious cases of viral gastroenteritis involve vomiting so severely that you are not able to keep fluids down. In these cases, fluids must be given through an intravenous line (IV). HOME CARE INSTRUCTIONS   Drink enough fluids to keep your urine clear or pale yellow. Drink small amounts of fluids frequently and increase the amounts as tolerated.  Ask your caregiver for specific rehydration instructions.  Avoid:  Foods high in  sugar.  Alcohol.  Carbonated drinks.  Tobacco.  Juice.  Caffeine drinks.  Extremely hot or cold fluids.  Fatty, greasy foods.  Too much intake of anything at one time.  Dairy products until 24 to 48 hours after diarrhea stops.  You may consume probiotics. Probiotics are active cultures of beneficial bacteria. They may lessen the amount and number of diarrheal stools in adults. Probiotics can be found in yogurt with active cultures and in supplements.  Wash your hands well to avoid spreading the virus.  Only take over-the-counter or prescription medicines for pain, discomfort, or fever as directed by your caregiver. Do not give aspirin to children. Antidiarrheal medicines are not recommended.  Ask your caregiver if you should continue to take your regular prescribed and over-the-counter medicines.  Keep all follow-up appointments as directed by your caregiver. SEEK IMMEDIATE MEDICAL CARE IF:   You are unable to keep fluids down.  You do not urinate at least once every 6 to 8 hours.  You develop shortness of breath.  You notice blood in your stool or vomit. This may look like coffee grounds.  You have abdominal pain that increases or is concentrated in one small area (localized).  You have persistent vomiting or diarrhea.  You have a fever.  The patient is a child younger than 3 months, and he or she has a fever.  The patient is a child older than 3 months, and he or she has a fever and persistent symptoms.  The patient is a child older than 3 months, and he or she has a fever and symptoms suddenly get worse.  The patient is  a baby, and he or she has no tears when crying. MAKE SURE YOU:   Understand these instructions.  Will watch your condition.  Will get help right away if you are not doing well or get worse.   This information is not intended to replace advice given to you by your health care provider. Make sure you discuss any questions you have with your  health care provider.   Document Released: 03/21/2005 Document Revised: 06/13/2011 Document Reviewed: 01/05/2011 Elsevier Interactive Patient Education [employer].

## 2015-05-13 NOTE — Telephone Encounter (Signed)
Error

## 2015-05-14 DIAGNOSIS — N179 Acute kidney failure, unspecified: Secondary | ICD-10-CM | POA: Diagnosis not present

## 2015-05-14 DIAGNOSIS — A0819 Acute gastroenteropathy due to other small round viruses: Secondary | ICD-10-CM | POA: Diagnosis not present

## 2015-05-14 DIAGNOSIS — E119 Type 2 diabetes mellitus without complications: Secondary | ICD-10-CM | POA: Diagnosis not present

## 2015-05-14 DIAGNOSIS — E860 Dehydration: Secondary | ICD-10-CM | POA: Diagnosis not present

## 2015-05-14 DIAGNOSIS — K219 Gastro-esophageal reflux disease without esophagitis: Secondary | ICD-10-CM | POA: Diagnosis not present

## 2015-05-14 DIAGNOSIS — E440 Moderate protein-calorie malnutrition: Secondary | ICD-10-CM | POA: Diagnosis not present

## 2015-05-14 DIAGNOSIS — A0811 Acute gastroenteropathy due to Norwalk agent: Secondary | ICD-10-CM | POA: Diagnosis not present

## 2015-05-14 DIAGNOSIS — F419 Anxiety disorder, unspecified: Secondary | ICD-10-CM | POA: Diagnosis not present

## 2015-05-14 LAB — GASTROINTESTINAL PANEL BY PCR, STOOL (REPLACES STOOL CULTURE)
ADENOVIRUS F40/41: NOT DETECTED
Astrovirus: NOT DETECTED
CAMPYLOBACTER SPECIES: NOT DETECTED
CRYPTOSPORIDIUM: NOT DETECTED
Cyclospora cayetanensis: NOT DETECTED
E. coli O157: NOT DETECTED
ENTAMOEBA HISTOLYTICA: NOT DETECTED
ENTEROAGGREGATIVE E COLI (EAEC): NOT DETECTED
ENTEROPATHOGENIC E COLI (EPEC): NOT DETECTED
Enterotoxigenic E coli (ETEC): NOT DETECTED
GIARDIA LAMBLIA: NOT DETECTED
NOROVIRUS GI/GII: DETECTED — AB
PLESIMONAS SHIGELLOIDES: NOT DETECTED
Rotavirus A: NOT DETECTED
Salmonella species: NOT DETECTED
Sapovirus (I, II, IV, and V): NOT DETECTED
Shiga like toxin producing E coli (STEC): NOT DETECTED
Shigella/Enteroinvasive E coli (EIEC): NOT DETECTED
VIBRIO CHOLERAE: NOT DETECTED
Vibrio species: NOT DETECTED
YERSINIA ENTEROCOLITICA: NOT DETECTED

## 2015-05-14 LAB — BASIC METABOLIC PANEL
Anion gap: 9 (ref 5–15)
BUN: 31 mg/dL — AB (ref 6–20)
CALCIUM: 8.5 mg/dL — AB (ref 8.9–10.3)
CHLORIDE: 107 mmol/L (ref 101–111)
CO2: 24 mmol/L (ref 22–32)
CREATININE: 0.98 mg/dL (ref 0.44–1.00)
GFR calc non Af Amer: 51 mL/min — ABNORMAL LOW (ref >60)
GFR, EST AFRICAN AMERICAN: 59 mL/min — AB (ref >60)
Glucose, Bld: 101 mg/dL — ABNORMAL HIGH (ref 65–99)
Potassium: 3.4 mmol/L — ABNORMAL LOW (ref 3.5–5.1)
Sodium: 140 mmol/L (ref 135–145)

## 2015-05-14 LAB — CBC
HCT: 37.7 % (ref 35.0–47.0)
Hemoglobin: 12.7 g/dL (ref 12.0–16.0)
MCH: 30.3 pg (ref 26.0–34.0)
MCHC: 33.7 g/dL (ref 32.0–36.0)
MCV: 89.9 fL (ref 80.0–100.0)
PLATELETS: 215 10*3/uL (ref 150–440)
RBC: 4.2 MIL/uL (ref 3.80–5.20)
RDW: 14.9 % — AB (ref 11.5–14.5)
WBC: 9.6 10*3/uL (ref 3.6–11.0)

## 2015-05-14 LAB — GLUCOSE, CAPILLARY
GLUCOSE-CAPILLARY: 98 mg/dL (ref 65–99)
GLUCOSE-CAPILLARY: 94 mg/dL (ref 65–99)
GLUCOSE-CAPILLARY: 104 mg/dL — AB (ref 65–99)
Glucose-Capillary: 154 mg/dL — ABNORMAL HIGH (ref 65–99)

## 2015-05-14 MED ORDER — ENOXAPARIN SODIUM 30 MG/0.3ML ~~LOC~~ SOLN: 30.0000 mg | SUBCUTANEOUS | Status: DC

## 2015-05-14 MED ORDER — ENOXAPARIN SODIUM 40 MG/0.4ML ~~LOC~~ SOLN
40.0000 mg | SUBCUTANEOUS | Status: DC
  Administered 2015-05-14: 40 mg via SUBCUTANEOUS
  Filled 2015-05-14: qty 0.4

## 2015-05-14 MED ORDER — POTASSIUM CHLORIDE CRYS ER 20 MEQ PO TBCR
40.0000 meq | EXTENDED_RELEASE_TABLET | Freq: Once | ORAL | Status: AC
  Administered 2015-05-14: 40 meq via ORAL
  Filled 2015-05-14: qty 2

## 2015-05-14 MED ORDER — LOPERAMIDE HCL 2 MG PO CAPS: 2.0000 mg | ORAL_CAPSULE | Freq: Four times a day (QID) | ORAL | Status: DC | PRN

## 2015-05-14 MED ORDER — POTASSIUM CHLORIDE CRYS ER 20 MEQ PO TBCR: 40.0000 meq | EXTENDED_RELEASE_TABLET | Freq: Once | ORAL | Status: DC

## 2015-05-14 MED ORDER — HYDROCODONE-ACETAMINOPHEN 5-325 MG PO TABS
1.0000 | ORAL_TABLET | Freq: Four times a day (QID) | ORAL | Status: DC | PRN
  Administered 2015-05-14 – 2015-05-15 (×2): 1 via ORAL
  Filled 2015-05-14 (×2): qty 1

## 2015-05-14 NOTE — Progress Notes (Signed)
Initial Nutrition Assessment  DOCUMENTATION CODES:   Non-severe (moderate) malnutrition in context of chronic illness  INTERVENTION:  Meals and snacks: Cater to pt preferences Medical Nutrition Supplement Therapy: Will add mightyshake BID on lunch and dinner tray for added nutrition   NUTRITION DIAGNOSIS:   Inadequate oral intake related to altered GI function as evidenced by per patient/family report.    GOAL:   Patient will meet greater than or equal to 90% of their needs    MONITOR:    (Energy intake, Digestive system)  REASON FOR ASSESSMENT:   Malnutrition Screening Tool    ASSESSMENT:      Pt admitted with norovirus, dehydration, hypotension  Past Medical History  Diagnosis Date  . Hypertension   . Coronary artery disease     CABG 1998  . Anxiety   . Diastolic dysfunction   . Hyperlipidemia   . PUD (peptic ulcer disease)   . Microcytic anemia   . Depression   . Diabetes mellitus     type 2, followed by endo.  . Diverticulosis   . Hemorrhoids   . History of heart attack   . Irritable bowel syndrome   . Chronic back pain 03/2012    lumbar DDD with herniation - s/p L S1 transforaminal ESI (10/2012 Dr. Sharlet Salina)  . Varicose veins   . Cystocele   . Hx of echocardiogram     a. Echo 10/13: mild LVH, EF 55-60%, Gr 1 diast dysfn, PASP 32  . Acute deep vein thrombosis (DVT) of distal vein of right lower extremity ([place]) 12/2013    Acute thrombus of R proximal gastrocnemius vein. Symptomatic provoked distal DVT  . Cellulitis of leg, left 01/16/2014  . PAD (peripheral artery disease) ([place]) 2013    ABI: R 0.91, L 0.83  . Chronic venous insufficiency   . Ankle wound     Current Nutrition: ate liquids (broth, jello) and cornflakes and milk this am per pt  Food/Nutrition-Related History: pt reports eats cereal and milk in am as well as eggs and breakfast meat around 10am, then has evening meal at 5pm  Consists of peanut butter crackers.  Reports she has been  doing this for awhile   Scheduled Medications:  . acidophilus  1 capsule Oral Daily  . aspirin EC  81 mg Oral Daily  . vitamin B-12  1,000 mcg Oral Daily  . enoxaparin (LOVENOX) injection  40 mg Subcutaneous Q24H  . insulin aspart  0-5 Units Subcutaneous QHS  . insulin aspart  0-9 Units Subcutaneous TID WC  . pantoprazole  40 mg Oral Daily  . pravastatin  20 mg Oral QHS  . traZODone  150 mg Oral QHS  . vitamin E  400 Units Oral Daily    Continuous Medications:  . sodium chloride 50 mL/hr at 05/14/15 0850     Electrolyte/Renal Profile and Glucose Profile:   Recent Labs Lab 05/12/15 1808 05/13/15 1623 05/14/15 0508  NA 140 137 140  K 4.1 3.8 3.4*  CL 102 102 107  CO2 26 24 24   BUN 24* 37* 31*  CREATININE 0.87 1.58* 0.98  CALCIUM 9.8 9.1 8.5*  GLUCOSE 205* 125* 101*   Protein Profile:   Recent Labs Lab 05/12/15 1808 05/13/15 1623  ALBUMIN 3.8 3.5    Gastrointestinal Profile: Last BM: 2/8   Nutrition-Focused Physical Exam Findings: Nutrition-Focused physical exam completed. Findings are no fat depletion, mild/moderate muscle depletion, and no edema.      Weight Change: noted 5% wt loss in  the last month per wt encounters    Diet Order:  Diet Heart Room service appropriate?: Yes; Fluid consistency:: Thin  Skin:   reviewed   Height:   Ht Readings from Last 1 Encounters:  05/13/15 4\' 9"  (1.448 m)    Weight:   Wt Readings from Last 1 Encounters:  05/13/15 148 lb 6 oz (67.302 kg)    Ideal Body Weight:     BMI:  Body mass index is 32.1 kg/(m^2).  Estimated Nutritional Needs:   Kcal:  BEE 748 kcals (IF 1.0-1.2, AF 1.3) [phone] kcals/d. Using IBW of 57kg  Protein:  (1.0-1.2 g/kg) 57-68 g/d  Fluid:  (25-52ml/kg) 1425-1736ml/d  EDUCATION NEEDS:   No education needs identified at this time  [place]. Zenia Resides, [place], [place] (pager) Weekend/On-Call pager [phone])

## 2015-05-14 NOTE — Progress Notes (Signed)
Per Dr. Leslye Peer do not give second dose of potassium. Pt already received 41meq of potassium one time today. Second dose entered in error.

## 2015-05-14 NOTE — Progress Notes (Signed)
Notified Dr. Leslye Peer that pt tested positive for norovirus in GI panel. Per Dr. Leslye Peer okay to place order for enteric precaution

## 2015-05-14 NOTE — Care Management Obs Status (Signed)
[place] NOTIFICATION   Patient Details  Name: [name] MRN: [id number] Date of Birth: Sep 12, 1927   Medicare Observation Status Notification Given:  Yes    Beau Fanny, RN 05/14/2015, 9:23 AM

## 2015-05-14 NOTE — Progress Notes (Signed)
[name], female   DOB: 05/19/1927, 82 y.o.   MRN: [id number] [facility] PROGRESS NOTE  [name] [id number] DOB: [birth year] DOA: 05/13/2015 PCP: Ria Bush, MD  HPI/Subjective: Patient tolerated diet this morning. No further nausea or vomiting. Still having diarrhea. She states that she can't control her diarrhea. Her friend at home also had diarrhea and nausea vomiting.  Objective: Filed Vitals:   05/14/15 0516 05/14/15 1324  BP: 103/46 124/72  Pulse: 72 67  Temp: 98.5 F (36.9 C) 98.3 F (36.8 C)  Resp: 20 17    Filed Weights   05/13/15 1444  Weight: 67.302 kg (148 lb 6 oz)    ROS: Review of Systems  Constitutional: Negative for fever and chills.  Eyes: Negative for blurred vision.  Respiratory: Negative for cough and shortness of breath.   Cardiovascular: Negative for chest pain.  Gastrointestinal: Positive for nausea, abdominal pain and diarrhea. Negative for vomiting and constipation.  Genitourinary: Negative for dysuria.  Musculoskeletal: Negative for joint pain.  Neurological: Negative for dizziness and headaches.   Exam: Physical Exam  HENT:  Nose: No mucosal edema.  Mouth/Throat: No oropharyngeal exudate or posterior oropharyngeal edema.  Eyes: Conjunctivae, EOM and lids are normal. Pupils are equal, round, and reactive to light.  Neck: No JVD present. Carotid bruit is not present. No edema present. No thyroid mass and no thyromegaly present.  Cardiovascular: S1 normal and S2 normal.  Exam reveals no gallop.   Murmur heard.  Systolic murmur is present with a grade of 2/6  Pulses:      Dorsalis pedis pulses are 2+ on the right side, and 2+ on the left side.  Respiratory: No respiratory distress. She has no wheezes. She has no rhonchi. She has no rales.  GI: Soft. Bowel sounds are normal. There is tenderness in the left lower quadrant.  Musculoskeletal:       Right ankle: She exhibits swelling.       Left ankle:  She exhibits swelling.  Lymphadenopathy:    She has no cervical adenopathy.  Neurological: She is alert. No cranial nerve deficit.  Skin: Skin is warm. No rash noted. Nails show no clubbing.  Psychiatric: She has a normal mood and affect.      Data Reviewed: Basic Metabolic Panel:  Recent Labs Lab 05/12/15 1808 05/13/15 1623 05/14/15 0508  NA 140 137 140  K 4.1 3.8 3.4*  CL 102 102 107  CO2 26 24 24   GLUCOSE 205* 125* 101*  BUN 24* 37* 31*  CREATININE 0.87 1.58* 0.98  CALCIUM 9.8 9.1 8.5*   Liver Function Tests:  Recent Labs Lab 05/12/15 1808 05/13/15 1623  AST 24 34  ALT 17 20  ALKPHOS 111 75  BILITOT 0.8 0.8  PROT 7.6 6.8  ALBUMIN 3.8 3.5    Recent Labs Lab 05/12/15 1808  LIPASE 33   CBC:  Recent Labs Lab 05/12/15 1808 05/13/15 1623 05/14/15 0508  WBC 17.3* 11.2* 9.6  HGB 14.6 13.3 12.7  HCT 45.9 39.8 37.7  MCV 92.4 89.3 89.9  PLT 289 232 215    CBG:  Recent Labs Lab 05/13/15 1737 05/13/15 2151 05/13/15 2210 05/14/15 0742 05/14/15 1142  GLUCAP 114* 113* 119* 98 154*    Recent Results (from the past 240 hour(s))  Gastrointestinal Panel by PCR , Stool     Status: Abnormal   Collection Time: 05/13/15  5:11 AM  Result Value Ref Range Status   Campylobacter species NOT DETECTED  NOT DETECTED Final   Plesimonas shigelloides NOT DETECTED NOT DETECTED Final   Salmonella species NOT DETECTED NOT DETECTED Final   Yersinia enterocolitica NOT DETECTED NOT DETECTED Final   Vibrio species NOT DETECTED NOT DETECTED Final   Vibrio cholerae NOT DETECTED NOT DETECTED Final   Enteroaggregative E coli (EAEC) NOT DETECTED NOT DETECTED Final   Enteropathogenic E coli (EPEC) NOT DETECTED NOT DETECTED Final   Enterotoxigenic E coli (ETEC) NOT DETECTED NOT DETECTED Final   Shiga like toxin producing E coli (STEC) NOT DETECTED NOT DETECTED Final   E. coli O157 NOT DETECTED NOT DETECTED Final   Shigella/Enteroinvasive E coli (EIEC) NOT DETECTED NOT  DETECTED Final   Cryptosporidium NOT DETECTED NOT DETECTED Final   Cyclospora cayetanensis NOT DETECTED NOT DETECTED Final   Entamoeba histolytica NOT DETECTED NOT DETECTED Final   Giardia lamblia NOT DETECTED NOT DETECTED Final   Adenovirus F40/41 NOT DETECTED NOT DETECTED Final   Astrovirus NOT DETECTED NOT DETECTED Final   Norovirus GI/GII DETECTED (A) NOT DETECTED Final    Comment: CRITICAL RESULT CALLED TO, READ BACK BY AND VERIFIED WITH: RN ABBIE GOODMAN @0744  05/14/15 MLM    Rotavirus A NOT DETECTED NOT DETECTED Final   Sapovirus (I, II, IV, and V) NOT DETECTED NOT DETECTED Final    Scheduled Meds: . acidophilus  1 capsule Oral Daily  . aspirin EC  81 mg Oral Daily  . vitamin B-12  1,000 mcg Oral Daily  . enoxaparin (LOVENOX) injection  40 mg Subcutaneous Q24H  . insulin aspart  0-5 Units Subcutaneous QHS  . insulin aspart  0-9 Units Subcutaneous TID WC  . pantoprazole  40 mg Oral Daily  . pravastatin  20 mg Oral QHS  . traZODone  150 mg Oral QHS  . vitamin E  400 Units Oral Daily   Continuous Infusions: . sodium chloride 50 mL/hr at 05/14/15 0850    Assessment/Plan:  1. Viral gastroenteritis with the norovirus. Patient nervous about going home today with diarrhea that's uncontrollable. Can order when necessary Imodium. Advance diet. 2. Acute renal failure with dehydration. Creatinine improved. Decrease rate of IV fluids 3. Type 2 diabetes mellitus- sliding scale while here 4. Anxiety- patient nervous about going home and states that she needs her Xanax. 5. Hyperlipidemia unspecified on pravastatin 6. Gastroesophageal reflux disease- on Protonix 7. Hypokalemia- replace potassium  Code Status:     Code Status Orders        Start     Ordered   05/13/15 1617  Full code   Continuous     05/13/15 1619    Code Status History    Date Active Date Inactive Code Status Order ID Comments User Context   This patient has a current code status but no historical code  status.    Advance Directive Documentation        Most Recent Value   Type of Advance Directive  Living will   Pre-existing out of facility DNR order (yellow form or pink MOST form)     "MOST" Form in Place?       Disposition Plan: Likely home tomorrow  Time spent: 25 minutes  Loletha Grayer  [facility]

## 2015-05-14 NOTE — Progress Notes (Signed)
Anticoagulation monitoring(Lovenox):  82 yo  ordered Lovenox 30 mg Q24h  Filed Weights   05/13/15 1444  Weight: 148 lb 6 oz (67.302 kg)   Body mass index is 32.1 kg/(m^2).  Lab Results  Component Value Date   CREATININE 0.98 05/14/2015   CREATININE 1.58* 05/13/2015   CREATININE 0.87 05/12/2015   Estimated Creatinine Clearance: 33.2 mL/min (by C-G formula based on Cr of 0.98). Hemoglobin & Hematocrit     Component Value Date/Time   HGB 12.7 05/14/2015 0508   HCT 37.7 05/14/2015 0508     Per Protocol for Patient with estCrcl> 30 ml/min and BMI < 40, will transition to Lovenox 40 mg Q24h.

## 2015-05-14 NOTE — Care Management Note (Signed)
Case Management Note  Patient Details  Name: [name] MRN: [id number] Date of Birth: Sep 20, 1927  Subjective/Objective: No signature on OBS letter as pt is in Isolation.                   Action/Plan:   Expected Discharge Date:                  Expected Discharge Plan:     In-House Referral:     Discharge planning Services     Post Acute Care Choice:    Choice offered to:     DME Arranged:    DME Agency:     HH Arranged:    [place] Agency:     Status of Service:     Medicare Important Message Given:    Date Medicare IM Given:    Medicare IM give by:    Date Additional Medicare IM Given:    Additional Medicare Important Message give by:     If discussed at [place] of Stay Meetings, dates discussed:    Additional Comments:  Beau Fanny, RN 05/14/2015, 9:24 AM

## 2015-05-15 ENCOUNTER — Telehealth: Payer: Self-pay

## 2015-05-15 ENCOUNTER — Ambulatory Visit: Payer: Medicare Other | Admitting: Surgery

## 2015-05-15 ENCOUNTER — Encounter: Payer: Medicare Other | Admitting: Surgery

## 2015-05-15 DIAGNOSIS — F419 Anxiety disorder, unspecified: Secondary | ICD-10-CM | POA: Diagnosis not present

## 2015-05-15 DIAGNOSIS — E860 Dehydration: Secondary | ICD-10-CM | POA: Diagnosis not present

## 2015-05-15 DIAGNOSIS — E11622 Type 2 diabetes mellitus with other skin ulcer: Secondary | ICD-10-CM | POA: Diagnosis not present

## 2015-05-15 DIAGNOSIS — I87331 Chronic venous hypertension (idiopathic) with ulcer and inflammation of right lower extremity: Secondary | ICD-10-CM | POA: Diagnosis not present

## 2015-05-15 DIAGNOSIS — E440 Moderate protein-calorie malnutrition: Secondary | ICD-10-CM | POA: Diagnosis not present

## 2015-05-15 DIAGNOSIS — A0811 Acute gastroenteropathy due to Norwalk agent: Secondary | ICD-10-CM | POA: Diagnosis not present

## 2015-05-15 DIAGNOSIS — N179 Acute kidney failure, unspecified: Secondary | ICD-10-CM | POA: Diagnosis not present

## 2015-05-15 DIAGNOSIS — E119 Type 2 diabetes mellitus without complications: Secondary | ICD-10-CM | POA: Diagnosis not present

## 2015-05-15 DIAGNOSIS — A084 Viral intestinal infection, unspecified: Secondary | ICD-10-CM | POA: Diagnosis not present

## 2015-05-15 DIAGNOSIS — I10 Essential (primary) hypertension: Secondary | ICD-10-CM | POA: Diagnosis not present

## 2015-05-15 DIAGNOSIS — E11610 Type 2 diabetes mellitus with diabetic neuropathic arthropathy: Secondary | ICD-10-CM | POA: Diagnosis not present

## 2015-05-15 DIAGNOSIS — A0819 Acute gastroenteropathy due to other small round viruses: Secondary | ICD-10-CM | POA: Diagnosis not present

## 2015-05-15 DIAGNOSIS — L97811 Non-pressure chronic ulcer of other part of right lower leg limited to breakdown of skin: Secondary | ICD-10-CM | POA: Diagnosis not present

## 2015-05-15 DIAGNOSIS — S41112A Laceration without foreign body of left upper arm, initial encounter: Secondary | ICD-10-CM | POA: Diagnosis not present

## 2015-05-15 DIAGNOSIS — K219 Gastro-esophageal reflux disease without esophagitis: Secondary | ICD-10-CM | POA: Diagnosis not present

## 2015-05-15 LAB — BASIC METABOLIC PANEL
Anion gap: 5 (ref 5–15)
BUN: 18 mg/dL (ref 6–20)
CALCIUM: 8.4 mg/dL — AB (ref 8.9–10.3)
CO2: 24 mmol/L (ref 22–32)
CREATININE: 0.65 mg/dL (ref 0.44–1.00)
Chloride: 111 mmol/L (ref 101–111)
GFR calc Af Amer: >60 mL/min (ref >60)
GLUCOSE: 105 mg/dL — AB (ref 65–99)
Potassium: 4.2 mmol/L (ref 3.5–5.1)
Sodium: 140 mmol/L (ref 135–145)

## 2015-05-15 LAB — GLUCOSE, CAPILLARY: Glucose-Capillary: 85 mg/dL (ref 65–99)

## 2015-05-15 MED ORDER — LORAZEPAM 1 MG PO TABS
1.0000 mg | ORAL_TABLET | Freq: Once | ORAL | Status: AC
  Administered 2015-05-15: 1 mg via ORAL
  Filled 2015-05-15: qty 1

## 2015-05-15 MED ORDER — NYSTATIN-TRIAMCINOLONE 100000-0.1 UNIT/GM-% EX OINT
TOPICAL_OINTMENT | Freq: Two times a day (BID) | CUTANEOUS | Status: DC
  Administered 2015-05-15: 10:00:00 via TOPICAL
  Filled 2015-05-15: qty 15

## 2015-05-15 NOTE — Telephone Encounter (Signed)
Initial TCM call - per individual who answered call, patient was not home

## 2015-05-15 NOTE — Consult Note (Signed)
WOC wound consult note Reason for Consult: Chronic ulcer to RLE.  Seen at wound care center. Has Apligraf in place and nonremovable dressing.  Confirmed appointment with Freda Munro for 3:30 today at wound care center.  Patient is to be discharged today.  Dressing is scheduled to be changed today.  Wound type: Chronic venous ulcer RLE, nonremovable dressing in place. Changed every Friday Pressure Ulcer POA: N/A To be seen at [facility] today.  Will not follow at this time.  Please re-consult if needed.  Domenic Moras RN BSN [place] Pager [phone]

## 2015-05-15 NOTE — Progress Notes (Signed)
Pt anxious and tearful. Pt reports that she does not remember taking her evening medication. Pt reassured that she had w/ no success. Pt ordered 1 mg of Ativan for a one time dose with a positive effect.

## 2015-05-15 NOTE — Discharge Summary (Signed)
[place] at [place] NAME: [name]    MR#:  [id number]  DATE OF BIRTH:  [birth year]  DATE OF ADMISSION:  05/13/2015 ADMITTING PHYSICIAN: Henreitta Leber, MD  DATE OF DISCHARGE: 05/15/2015 12:07 PM  PRIMARY CARE PHYSICIAN: Ria Bush, MD    ADMISSION DIAGNOSIS:  Dehydration   DISCHARGE DIAGNOSIS:  Active Problems:   Viral Gastroenteritis   Dehydration   Malnutrition of moderate degree   SECONDARY DIAGNOSIS:   Past Medical History  Diagnosis Date  . Hypertension   . Coronary artery disease     CABG 1998  . Anxiety   . Diastolic dysfunction   . Hyperlipidemia   . PUD (peptic ulcer disease)   . Microcytic anemia   . Depression   . Diabetes mellitus     type 2, followed by endo.  . Diverticulosis   . Hemorrhoids   . History of heart attack   . Irritable bowel syndrome   . Chronic back pain 03/2012    lumbar DDD with herniation - s/p L S1 transforaminal ESI (10/2012 Dr. Sharlet [place]ina)  . Varicose veins   . Cystocele   . Hx of echocardiogram     a. Echo 10/13: mild LVH, EF 55-60%, Gr 1 diast dysfn, PASP 32  . Acute deep vein thrombosis (DVT) of distal vein of right lower extremity ([place]) 12/2013    Acute thrombus of R proximal gastrocnemius vein. Symptomatic provoked distal DVT  . Cellulitis of leg, left 01/16/2014  . PAD (peripheral artery disease) ([place]) 2013    ABI: R 0.91, L 0.83  . Chronic venous insufficiency   . Ankle wound     HOSPITAL COURSE:   82 y.o. female with a known history of hypertension, coronary disease status post bypass, anxiety, history of CHF with diastolic dysfunction, hyperlipidemia, peptic ulcer disease, diabetes type 2 without complication, history of previous DVT, IBS who presents to the hospital due to nausea vomiting and diarrhea ongoing for the past 3-4 days.  #1 viral gastroenteritis-this was the cause of patient's ongoing nausea vomiting and diarrhea. -We'll culture PCR was  positive for Norovirus -[facility] hospital patient was treated supportively with IV fluids, Imodium, antiemetics. With supportive care her clinical symptoms have improved and his diarrhea has now resolved. And she is tolerating a regular diet well and therefore being discharged home.  #2 dehydration-result of the underlying gastroenteritis.  -This improved and has resolved with IV fluids  #3 hypotension-due to dehydration and underlying gastroenteritis. - this has also resolved with IV fluids and she is now hemodynamically stable.  - she will resume her anti-HTN upon discharge  #4 diabetes type 2 without complication-while in the hospital she was maintained on sliding scale insulin. -She can resume her Actos upon discharge.  #5 anxiety-she will continue as needed Xanax.  #6 GERD-she will continue her Dexilant  #7 hyperlipidemia-she will continue Pravachol.  DISCHARGE CONDITIONS:   Stable  CONSULTS OBTAINED:     DRUG ALLERGIES:   Allergies  Allergen Reactions  . Amoxicillin-Pot Clavulanate Other (See Comments)    Reaction:  Unknown   . Metformin And Related Diarrhea    DISCHARGE MEDICATIONS:   Discharge Medication List as of 05/15/2015 11:32 AM    CONTINUE these medications which have NOT CHANGED   Details  acidophilus (RISAQUAD) CAPS capsule Take 1 capsule by mouth daily., Until Discontinued, Historical Med    ALPRAZolam (XANAX) 1 MG tablet Take 0.5 mg by mouth 4 (four) times daily as  needed for anxiety. , Until Discontinued, Historical Med    aspirin EC 81 MG tablet Take 81 mg by mouth daily., Until Discontinued, Historical Med    Dexlansoprazole (DEXILANT) 30 MG capsule Take 1 capsule (30 mg total) by mouth daily., Starting 05/12/2015, Until Discontinued, Normal    ferrous sulfate 325 (65 FE) MG tablet Take 1 tablet (325 mg total) by mouth daily with breakfast., Starting 09/06/2014, Until Discontinued, OTC    furosemide (LASIX) 20 MG tablet Take 20 mg by mouth daily.,  Until Discontinued, Historical Med    glucosamine-chondroitin 500-400 MG tablet Take 1 tablet by mouth daily. , Until Discontinued, Historical Med    HYDROcodone-acetaminophen (NORCO/VICODIN) 5-325 MG tablet Take 0.5-1 tablets by mouth every 6 (six) hours as needed for moderate pain., Starting 05/05/2015, Until Discontinued, Print    loperamide (IMODIUM) 2 MG capsule Take 2 mg by mouth 4 (four) times daily as needed for diarrhea or loose stools., Until Discontinued, Historical Med    nitroGLYCERIN (NITROSTAT) 0.4 MG SL tablet Place 0.4 mg under the tongue every 5 (five) minutes as needed for chest pain. , Until Discontinued, Historical Med    nystatin-triamcinolone ointment (MYCOLOG) Apply 1 application topically 2 (two) times daily as needed (for itching)., Until Discontinued, Historical Med    ondansetron (ZOFRAN-ODT) 4 MG disintegrating tablet Take 1 tablet (4 mg total) by mouth every 8 (eight) hours as needed for nausea or vomiting., Starting 05/12/2015, Until Discontinued, Print    pioglitazone (ACTOS) 30 MG tablet Take 30 mg by mouth every evening. , Until Discontinued, Historical Med    pravastatin (PRAVACHOL) 20 MG tablet Take 20 mg by mouth at bedtime., Until Discontinued, Historical Med    ramipril (ALTACE) 5 MG capsule Take 5 mg by mouth every evening. , Until Discontinued, Historical Med    traZODone (DESYREL) 150 MG tablet Take 150 mg by mouth at bedtime. , Until Discontinued, Historical Med    vitamin B-12 (CYANOCOBALAMIN) 1000 MCG tablet Take 1,000 mcg by mouth daily., Until Discontinued, Historical Med    vitamin E 400 UNIT capsule Take 400 Units by mouth daily., Until Discontinued, Historical Med         DISCHARGE INSTRUCTIONS:   DIET:  Cardiac diet  DISCHARGE CONDITION:  Stable  ACTIVITY:  Activity as tolerated  OXYGEN:  Home Oxygen: No.   Oxygen Delivery: room air  DISCHARGE LOCATION:  home   If you experience worsening of your admission symptoms,  develop shortness of breath, life threatening emergency, suicidal or homicidal thoughts you must seek medical attention immediately by calling 911 or calling your MD immediately  if symptoms less severe.  You Must read complete instructions/literature along with all the possible adverse reactions/side effects for all the Medicines you take and that have been prescribed to you. Take any new Medicines after you have completely understood and accpet all the possible adverse reactions/side effects.   Please note  You were cared for by a hospitalist during your hospital stay. If you have any questions about your discharge medications or the care you received while you were in the hospital after you are discharged, you can call the unit and asked to speak with the hospitalist on call if the hospitalist that took care of you is not available. Once you are discharged, your primary care physician will handle any further medical issues. Please note that NO REFILLS for any discharge medications will be authorized once you are discharged, as it is imperative that you return to your primary care  physician (or establish a relationship with a primary care physician if you do not have one) for your aftercare needs so that they can reassess your need for medications and monitor your lab values.     Today   No diarrhea overnight or this morning. No abdominal pain. Tolerating regular diet well.  VITAL SIGNS:  Blood pressure 133/62, pulse 78, temperature 98.3 F (36.8 C), temperature source Oral, resp. rate 16, height 4\' 9"  (1.448 m), weight 67.302 kg (148 lb 6 oz), SpO2 95 %.  I/O:   Intake/Output Summary (Last 24 hours) at 05/15/15 1738 Last data filed at 05/15/15 0829  Gross per 24 hour  Intake  585.3 ml  Output    175 ml  Net  410.3 ml    PHYSICAL EXAMINATION:    GENERAL: 82 y.o.-year-old patient lying in the bed with no acute distress.  EYES: Pupils equal, round, reactive to light and  accommodation. No scleral icterus. Extraocular muscles intact.  HEENT: Head atraumatic, normocephalic. Oropharynx and nasopharynx clear. No oropharyngeal erythema, moist oral mucosa  NECK: Supple, no jugular venous distention. No thyroid enlargement, no tenderness.  LUNGS: Normal breath sounds bilaterally, no wheezing, rales, rhonchi. No use of accessory muscles of respiration.  CARDIOVASCULAR: S1, S2 RRR. No murmurs, rubs, gallops, clicks.  ABDOMEN: Soft, nontender, nondistended. Bowel sounds present. No organomegaly or mass.  EXTREMITIES: No pedal edema, cyanosis, or clubbing. + 2 pedal & radial pulses b/l.  NEUROLOGIC: Cranial nerves II through XII are intact. No focal Motor or sensory deficits appreciated b/l PSYCHIATRIC: The patient is alert and oriented x 3. Good affect.  SKIN: No obvious rash, lesion, or ulcer.   DATA REVIEW:   CBC  Recent Labs Lab 05/14/15 0508  WBC 9.6  HGB 12.7  HCT 37.7  PLT 215    Chemistries   Recent Labs Lab 05/13/15 1623  05/15/15 0819  NA 137  < > 140  K 3.8  < > 4.2  CL 102  < > 111  CO2 24  < > 24  GLUCOSE 125*  < > 105*  BUN 37*  < > 18  CREATININE 1.58*  < > 0.65  CALCIUM 9.1  < > 8.4*  AST 34  --   --   ALT 20  --   --   ALKPHOS 75  --   --   BILITOT 0.8  --   --   < > = values in this interval not displayed.  Cardiac Enzymes No results for input(s): TROPONINI in the last 168 hours.  Microbiology Results  Results for orders placed or performed during the hospital encounter of 05/13/15  Gastrointestinal Panel by PCR , Stool     Status: Abnormal   Collection Time: 05/13/15  5:11 AM  Result Value Ref Range Status   Campylobacter species NOT DETECTED NOT DETECTED Final   Plesimonas shigelloides NOT DETECTED NOT DETECTED Final   Salmonella species NOT DETECTED NOT DETECTED Final   Yersinia enterocolitica NOT DETECTED NOT DETECTED Final   Vibrio species NOT DETECTED NOT DETECTED Final   Vibrio cholerae NOT DETECTED NOT  DETECTED Final   Enteroaggregative E coli (EAEC) NOT DETECTED NOT DETECTED Final   Enteropathogenic E coli (EPEC) NOT DETECTED NOT DETECTED Final   Enterotoxigenic E coli (ETEC) NOT DETECTED NOT DETECTED Final   Shiga like toxin producing E coli (STEC) NOT DETECTED NOT DETECTED Final   E. coli O157 NOT DETECTED NOT DETECTED Final   Shigella/Enteroinvasive E coli (EIEC) NOT DETECTED  NOT DETECTED Final   Cryptosporidium NOT DETECTED NOT DETECTED Final   Cyclospora cayetanensis NOT DETECTED NOT DETECTED Final   Entamoeba histolytica NOT DETECTED NOT DETECTED Final   Giardia lamblia NOT DETECTED NOT DETECTED Final   Adenovirus F40/41 NOT DETECTED NOT DETECTED Final   Astrovirus NOT DETECTED NOT DETECTED Final   Norovirus GI/GII DETECTED (A) NOT DETECTED Final    Comment: CRITICAL RESULT CALLED TO, READ BACK BY AND VERIFIED WITH: RN ABBIE GOODMAN @0744  05/14/15 MLM    Rotavirus A NOT DETECTED NOT DETECTED Final   Sapovirus (I, II, IV, and V) NOT DETECTED NOT DETECTED Final    RADIOLOGY:  No results found.    Management plans discussed with the patient, family and they are in agreement.  CODE STATUS:  Code Status History    Date Active Date Inactive Code Status Order ID Comments User Context   05/13/2015  4:19 PM 05/15/2015  3:09 PM Full Code [id number]  Henreitta Leber, MD Inpatient    Advance Directive Documentation        Most Recent Value   Type of Advance Directive  Living will   Pre-existing out of facility DNR order (yellow form or pink MOST form)     "MOST" Form in Place?        TOTAL TIME TAKING CARE OF THIS PATIENT: 40 minutes.    Henreitta Leber M.D on 05/15/2015 at 5:38 PM  Between 7am to 6pm - Pager - [phone]  After 6pm go to [email or web address] - password [email or web address] [facility]  [place] [facility]  Office  [phone]  CC: Primary care physician; Ria Bush, MD

## 2015-05-16 NOTE — Progress Notes (Addendum)
MARKEVIA, SCHAPIRO ([id number]) Visit Report for 05/15/2015 Arrival Information Details Patient Name: [name], [name] Date of Service: 05/15/2015 3:45 PM Medical Record Number: [id number] Patient Account Number: [id number] Date of Birth/Sex: 24-Jun-1927 (82 y.o. Female) Treating RN: Carolyne Fiscal, Debi Primary Care Physician: Ria Bush Other Clinician: Referring Physician: Ria Bush Treating Physician/Extender: Frann Rider in Treatment: 39 Visit Information History Since Last Visit All ordered tests and consults were completed: No Patient Arrived: Walker Added or deleted any medications: No Arrival Time: 15:57 Any new allergies or adverse reactions: No Accompanied By: self Had a fall or experienced change in No Transfer Assistance: EasyPivot Patient activities of daily living that may affect Lift risk of falls: Patient Identification Verified: Yes Signs or symptoms of abuse/neglect since last No Secondary Verification Process Yes visito Completed: Hospitalized since last visit: No Patient Requires Transmission- No Pain Present Now: No Based Precautions: Patient Has Alerts: Yes Patient Alerts: Patient on Blood Thinner Type II Diabetic 81 MG aspirin Electronic Signature(s) Signed: 05/15/2015 5:45:24 PM By: Alric Quan Entered By: Alric Quan on 05/15/2015 15:57:26 [name], [name] ([id number]) -------------------------------------------------------------------------------- Clinic Level of Care Assessment Details Patient Name: [name], [name] Date of Service: 05/15/2015 3:45 PM Medical Record Number: [id number] Patient Account Number: [id number] Date of Birth/Sex: September 29, 1927 (82 y.o. Female) Treating RN: Carolyne Fiscal, Debi Primary Care Physician: Ria Bush Other Clinician: Referring Physician: Ria Bush Treating Physician/Extender: Frann Rider in Treatment: 39 Clinic Level of Care Assessment Items TOOL 4 Quantity  Score []  - Use when only an EandM is performed on FOLLOW-UP visit 0 ASSESSMENTS - Nursing Assessment / Reassessment []  - Reassessment of Co-morbidities (includes updates in patient status) 0 []  - Reassessment of Adherence to Treatment Plan 0 ASSESSMENTS - Wound and Skin Assessment / Reassessment []  - Simple Wound Assessment / Reassessment - one wound 0 X - Complex Wound Assessment / Reassessment - multiple wounds 2 5 []  - Dermatologic / Skin Assessment (not related to wound area) 0 ASSESSMENTS - Focused Assessment []  - Circumferential Edema Measurements - multi extremities 0 []  - Nutritional Assessment / Counseling / Intervention 0 []  - Lower Extremity Assessment (monofilament, tuning fork, pulses) 0 []  - Peripheral Arterial Disease Assessment (using hand held doppler) 0 ASSESSMENTS - Ostomy and/or Continence Assessment and Care []  - Incontinence Assessment and Management 0 []  - Ostomy Care Assessment and Management (repouching, etc.) 0 PROCESS - Coordination of Care X - Simple Patient / Family Education for ongoing care 1 15 []  - Complex (extensive) Patient / Family Education for ongoing care 0 []  - Staff obtains Programmer, systems, Records, Test Results / Process Orders 0 []  - Staff telephones HHA, Nursing Homes / Clarify orders / etc 0 []  - Routine Transfer to another Facility (non-emergent condition) 0 [name], [name] ([id number]) []  - Routine Hospital Admission (non-emergent condition) 0 []  - New Admissions / Biomedical engineer / Ordering NPWT, Apligraf, etc. 0 []  - Emergency Hospital Admission (emergent condition) 0 X - Simple Discharge Coordination 1 10 []  - Complex (extensive) Discharge Coordination 0 PROCESS - Special Needs []  - Pediatric / Minor Patient Management 0 []  - Isolation Patient Management 0 []  - Hearing / Language / Visual special needs 0 []  - Assessment of Community assistance (transportation, D/[name] planning, etc.) 0 []  - Additional assistance / Altered mentation  0 []  - Support Surface(s) Assessment (bed, cushion, seat, etc.) 0 INTERVENTIONS - Wound Cleansing / Measurement []  - Simple Wound Cleansing - one wound 0 X - Complex Wound Cleansing - multiple wounds 2 5 X -  Wound Imaging (photographs - any number of wounds) 1 5 []  - Wound Tracing (instead of photographs) 0 []  - Simple Wound Measurement - one wound 0 X - Complex Wound Measurement - multiple wounds 2 5 INTERVENTIONS - Wound Dressings X - Small Wound Dressing one or multiple wounds 2 10 []  - Medium Wound Dressing one or multiple wounds 0 []  - Large Wound Dressing one or multiple wounds 0 X - Application of Medications - topical 1 5 []  - Application of Medications - injection 0 INTERVENTIONS - Miscellaneous []  - External ear exam 0 [name], [name] ([id number]) []  - Specimen Collection (cultures, biopsies, blood, body fluids, etc.) 0 []  - Specimen(s) / Culture(s) sent or taken to Lab for analysis 0 []  - Patient Transfer (multiple staff / Harrel Lemon Lift / Similar devices) 0 []  - Simple Staple / Suture removal (25 or less) 0 []  - Complex Staple / Suture removal (26 or more) 0 []  - Hypo / Hyperglycemic Management (close monitor of Blood Glucose) 0 []  - Ankle / Brachial Index (ABI) - do not check if billed separately 0 X - Vital Signs 1 5 Has the patient been seen at the hospital within the last three years: Yes Total Score: 90 Level Of Care: New/Established - Level 3 Electronic Signature(s) Signed: 05/15/2015 5:45:24 PM By: Alric Quan Entered By: Alric Quan on 05/15/2015 17:02:53 [name], [name] -------------------------------------------------------------------------------- Encounter Discharge Information Details Patient Name: [name], [name] Date of Service: 05/15/2015 3:45 PM Medical Record Number: [id number] Patient Account Number: [id number] Date of Birth/Sex: 1927/10/14 (82 y.o. Female) Treating RN: Carolyne Fiscal, Debi Primary Care Physician: Ria Bush Other Clinician: Referring Physician: Ria Bush Treating Physician/Extender: Frann Rider in Treatment: 63 Encounter Discharge Information Items Discharge Pain Level: 0 Discharge Condition: Stable Ambulatory Status: Walker Discharge Destination: Home Transportation: Private Auto Accompanied By: son Schedule Follow-up Appointment: Yes Medication Reconciliation completed Yes and provided to Patient/Care Concepcion Kirkpatrick: Provided on Clinical Summary of Care: 05/15/2015 Form Type Recipient Paper Patient LL Electronic Signature(s) Signed: 05/15/2015 4:37:18 PM By: Ruthine Dose Entered By: Ruthine Dose on 05/15/2015 16:37:18 Maddy, [name] -------------------------------------------------------------------------------- Lower Extremity Assessment Details Patient Name: Maestas, Ressie [name]. Date of Service: 05/15/2015 3:45 PM Medical Record Number: [id number] Patient Account Number: [id number] Date of Birth/Sex: 04/25/1927 (82 y.o. Female) Treating RN: Carolyne Fiscal, Debi Primary Care Physician: Ria Bush Other Clinician: Referring Physician: Ria Bush Treating Physician/Extender: Frann Rider in Treatment: 39 Edema Assessment Assessed: [Left: No] [Right: No] E[Left: dema] [Right: :] Calf Left: Right: Point of Measurement: cm From Medial Instep cm 26 cm Ankle Left: Right: Point of Measurement: cm From Medial Instep cm 19.5 cm Vascular Assessment Pulses: Posterior Tibial Dorsalis Pedis Palpable: [Right:Yes] Extremity colors, hair growth, and conditions: Extremity Color: [Right:Mottled] Temperature of Extremity: [Right:Warm] Capillary Refill: [Right:< 3 seconds] Toe Nail Assessment Left: Right: Thick: No Discolored: No Deformed: No Improper Length and Hygiene: No Electronic Signature(s) Signed: 05/15/2015 5:45:24 PM By: Alric Quan Entered By: Alric Quan on 05/15/2015 16:04:04 Ancheta, [name] CMarland Kitchen  ([id number]) -------------------------------------------------------------------------------- Multi Wound Chart Details Patient Name: Dilks, [name] Date of Service: 05/15/2015 3:45 PM Medical Record Number: [id number] Patient Account Number: [id number] Date of Birth/Sex: 1927-07-21 (82 y.o. Female) Treating RN: Carolyne Fiscal, Debi Primary Care Physician: Ria Bush Other Clinician: Referring Physician: Ria Bush Treating Physician/Extender: Frann Rider in Treatment: 39 Vital Signs Height(in): 58 Pulse(bpm): 67 Weight(lbs): 151 Blood Pressure 156/63 (mmHg): Body Mass Index(BMI): 32 Temperature(F): 98.2 Respiratory Rate 20 (breaths/min): Photos: [1:No Photos] [3:No Photos] [N/A:N/A] Wound  Location: [1:Right Lower Leg - Medial Right Lower Leg - Medial,] [3:Proximal] [N/A:N/A] Wounding Event: [1:Gradually Appeared] [3:Gradually Appeared] [N/A:N/A] Primary Etiology: [1:Venous Leg Ulcer] [3:Venous Leg Ulcer] [N/A:N/A] Comorbid History: [1:Cataracts, Anemia, Coronary Artery Disease, Coronary Artery Disease, Hypertension, Myocardial Hypertension, Myocardial Infarction, Type II Diabetes, Osteoarthritis Diabetes, Osteoarthritis] [3:Cataracts, Anemia, Infarction, Type II]  [N/A:N/A] Date Acquired: [1:04/04/2014] [3:05/15/2015] [N/A:N/A] Weeks of Treatment: [1:39] [3:0] [N/A:N/A] Wound Status: [1:Open] [3:Open] [N/A:N/A] Measurements L x W x D 2.7x0.5x0.2 [3:0.8x0.8x0.1] [N/A:N/A] (cm) Area (cm) : [1:1.06] [3:0.503] [N/A:N/A] Volume (cm) : [1:0.212] [3:0.05] [N/A:N/A] % Reduction in Area: [1:82.00%] [3:0.00%] [N/A:N/A] % Reduction in Volume: 88.00% [3:0.00%] [N/A:N/A] Classification: [1:Full Thickness Without Exposed Support Structures] [3:Partial Thickness] [N/A:N/A] HBO Classification: [1:Grade 1] [3:Grade 1] [N/A:N/A] Exudate Amount: [1:Medium] [3:Medium] [N/A:N/A] Exudate Type: [1:Serosanguineous] [3:Serous] [N/A:N/A] Exudate Color: [1:red, brown] [3:amber]  [N/A:N/A] Wound Margin: [1:Distinct, outline attached Flat and Intact] [N/A:N/A] Granulation Amount: [1:Large (67-100%)] [3:Large (67-100%)] [N/A:N/A] Granulation Quality: [1:Red, Pink] [3:Red] [N/A:N/A] Necrotic Amount: [1:Small (1-33%)] [3:None Present (0%)] [N/A:N/A] Exposed Structures: Fascia: No Fascia: No N/A Fat: No Fat: No Tendon: No Tendon: No Muscle: No Muscle: No Joint: No Joint: No Bone: No Bone: No Limited to Skin Limited to Skin Breakdown Breakdown Epithelialization: None Small (1-33%) N/A Periwound Skin Texture: Edema: Yes Edema: No N/A Excoriation: No Excoriation: No Induration: No Induration: No Callus: No Callus: No Crepitus: No Crepitus: No Fluctuance: No Fluctuance: No Friable: No Friable: No Rash: No Rash: No Scarring: No Scarring: No Periwound Skin Moist: Yes Moist: Yes N/A Moisture: Maceration: No Maceration: No Dry/Scaly: No Dry/Scaly: No Periwound Skin Color: Erythema: Yes Atrophie Blanche: No N/A Mottled: Yes Cyanosis: No Atrophie Blanche: No Ecchymosis: No Cyanosis: No Erythema: No Ecchymosis: No Hemosiderin Staining: No Hemosiderin Staining: No Mottled: No Pallor: No Pallor: No Rubor: No Rubor: No Erythema Location: Circumferential N/A N/A Temperature: No Abnormality N/A N/A Tenderness on Yes Yes N/A Palpation: Wound Preparation: Ulcer Cleansing: Other: Ulcer Cleansing: Other: N/A soap and water soap and water Topical Anesthetic Topical Anesthetic Applied: Other: lidocaine Applied: Other: lidocaine 4% 4% Treatment Notes Electronic Signature(s) Signed: 05/15/2015 5:45:24 PM By: Alric Quan Entered By: Alric Quan on 05/15/2015 16:17:31 Talcott, [name] -------------------------------------------------------------------------------- [place] Details Patient Name: Dalporto, [name] Date of Service: 05/15/2015 3:45 PM Medical Record Number: [id number] Patient Account  Number: [id number] Date of Birth/Sex: 1928-02-04 (82 y.o. Female) Treating RN: Carolyne Fiscal, Debi Primary Care Physician: Ria Bush Other Clinician: Referring Physician: Ria Bush Treating Physician/Extender: Frann Rider in Treatment: 60 Active Inactive Abuse / Safety / Falls / Self Care Management Nursing Diagnoses: Potential for falls Goals: Patient will remain injury free Date Initiated: 08/15/2014 Goal Status: Active Interventions: Assess fall risk on admission and as needed Notes: Nutrition Nursing Diagnoses: Impaired glucose control: actual or potential Goals: Patient/caregiver verbalizes understanding of need to maintain therapeutic glucose control per primary care physician Date Initiated: 08/15/2014 Goal Status: Active Interventions: Assess patient nutrition upon admission and as needed per policy Notes: Orientation to the Wound Care Program Nursing Diagnoses: Knowledge deficit related to the wound healing center program Goals: Patient/caregiver will verbalize understanding of the [place] ADJOA, OVERHOLTZER ([id number]) Date Initiated: 08/15/2014 Goal Status: Active Interventions: Provide education on orientation to the wound center Notes: Venous Leg Ulcer Nursing Diagnoses: Actual venous Insuffiency (use after diagnosis is confirmed) Goals: Patient will maintain optimal edema control Date Initiated: 08/15/2014 Goal Status: Active Interventions: Compression as ordered Treatment Activities: Test ordered outside of clinic : 05/15/2015 Notes: Wound/Skin Impairment Nursing  Diagnoses: Impaired tissue integrity Goals: Patient/caregiver will verbalize understanding of skin care regimen Date Initiated: 08/15/2014 Goal Status: Active Interventions: Assess patient/caregiver ability to obtain necessary supplies Notes: Electronic Signature(s) Signed: 05/15/2015 5:45:24 PM By: Alric Quan Entered By: Alric Quan on  05/15/2015 16:17:21 [place], [name] -------------------------------------------------------------------------------- Pain Assessment Details Patient Name: Searls, [name] Date of Service: 05/15/2015 3:45 PM Medical Record Number: [id number] Patient Account Number: [id number] Date of Birth/Sex: [birth year] (82 y.o. Female) Treating RN: Carolyne Fiscal, Debi Primary Care Physician: Ria Bush Other Clinician: Referring Physician: Ria Bush Treating Physician/Extender: Frann Rider in Treatment: 39 Active Problems Location of Pain Severity and Description of Pain Patient Has [name] No Site Locations Pain Management and Medication Current Pain Management: Electronic Signature(s) Signed: 05/15/2015 5:45:24 PM By: Alric Quan Entered By: Alric Quan on 05/15/2015 15:57:31 Villers, [name] -------------------------------------------------------------------------------- Patient/Caregiver Education Details Patient Name: Lagan, Anaija [name]. Date of Service: 05/15/2015 3:45 PM Medical Record Number: [id number] Patient Account Number: [id number] Date of Birth/Gender: April 27, 1927 (82 y.o. Female) Treating RN: Carolyne Fiscal, Debi Primary Care Physician: Ria Bush Other Clinician: Referring Physician: Ria Bush Treating Physician/Extender: Frann Rider in Treatment: 1 Education Assessment Education Provided To: Patient Education Topics Provided Wound/Skin Impairment: Handouts: Other: do not get wraps wet Methods: Demonstration, Explain/Verbal Responses: State content correctly Electronic Signature(s) Signed: 05/15/2015 5:45:24 PM By: Alric Quan Entered By: Alric Quan on 05/15/2015 16:36:00 Rennert, [name] -------------------------------------------------------------------------------- Wound Assessment Details Patient Name: Yoss, Sydnei [name]. Date of Service: 05/15/2015 3:45 PM Medical  Record Number: [id number] Patient Account Number: [id number] Date of Birth/Sex: 06/30/1927 (82 y.o. Female) Treating RN: Carolyne Fiscal, Debi Primary Care Physician: Ria Bush Other Clinician: Referring Physician: Ria Bush Treating Physician/Extender: Frann Rider in Treatment: 43 Wound Status Wound Number: 1 Primary Venous Leg Ulcer Etiology: Wound Location: Right Lower Leg - Medial Wound Open Wounding Event: Gradually Appeared Status: Date Acquired: 04/04/2014 Comorbid Cataracts, Anemia, Coronary Artery Weeks Of Treatment: 39 History: Disease, Hypertension, Myocardial Clustered Wound: No Infarction, Type II Diabetes, Osteoarthritis Photos Photo Uploaded By: Alric Quan on 05/15/2015 17:49:41 Wound Measurements Length: (cm) 2.7 Width: (cm) 0.5 Depth: (cm) 0.2 Area: (cm) 1.06 Volume: (cm) 0.212 % Reduction in Area: 82% % Reduction in Volume: 88% Epithelialization: None Tunneling: No Undermining: No Wound Description Full Thickness Without Exposed Foul Odor Af Classification: Support Structures Diabetic Severity Grade 1 (Wagner): Wound Margin: Distinct, outline attached Exudate Amount: Medium Exudate Type: Serosanguineous Exudate Color: red, brown ter Cleansing: No Wound Bed [name], [name] ([id number]) Granulation Amount: Large (67-100%) Exposed Structure Granulation Quality: Red, Pink Fascia Exposed: No Necrotic Amount: Small (1-33%) Fat Layer Exposed: No Necrotic Quality: Adherent Slough Tendon Exposed: No Muscle Exposed: No Joint Exposed: No Bone Exposed: No Limited to Skin Breakdown Periwound Skin Texture Texture Color No Abnormalities Noted: No No Abnormalities Noted: No Callus: No Atrophie Blanche: No Crepitus: No Cyanosis: No Excoriation: No Ecchymosis: No Fluctuance: No Erythema: Yes Friable: No Erythema Location: Circumferential Induration: No Hemosiderin Staining: No Localized Edema: Yes Mottled:  Yes Rash: No Pallor: No Scarring: No Rubor: No Moisture Temperature / Pain No Abnormalities Noted: No Temperature: No Abnormality Dry / Scaly: No Tenderness on Palpation: Yes Maceration: No Moist: Yes Wound Preparation Ulcer Cleansing: Other: soap and water, Topical Anesthetic Applied: Other: lidocaine 4%, Treatment Notes Wound #1 (Right, Medial Lower Leg) 1. Cleansed with: Cleanse wound with antibacterial soap and water 2. Anesthetic Topical Lidocaine 4% cream to wound bed prior to debridement 3. Peri-wound Care: Skin Prep 4. Dressing Applied: Other dressing (  specify in notes) 7. Secured with Tape 2 Layer Lite Compression System - Right Lower Extremity Notes mepitel one, steri-strips Hardie, [name] ([id number]) Electronic Signature(s) Signed: 05/15/2015 5:45:24 PM By: Alric Quan Entered By: Alric Quan on 05/15/2015 16:13:44 Brinley, [name] ([id number]) -------------------------------------------------------------------------------- Wound Assessment Details Patient Name: Kurtz, [name] Date of Service: 05/15/2015 3:45 PM Medical Record Number: [id number] Patient Account Number: [id number] Date of Birth/Sex: [birth year] (82 y.o. Female) Treating RN: Carolyne Fiscal, Debi Primary Care Physician: Ria Bush Other Clinician: Referring Physician: Ria Bush Treating Physician/Extender: Frann Rider in Treatment: 33 Wound Status Wound Number: 3 Primary Venous Leg Ulcer Etiology: Wound Location: Right Lower Leg - Medial, Proximal Wound Open Status: Wounding Event: Gradually Appeared Comorbid Cataracts, Anemia, Coronary Artery Date Acquired: 05/15/2015 History: Disease, Hypertension, Myocardial Weeks Of Treatment: 0 Infarction, Type II Diabetes, Clustered Wound: No Osteoarthritis Photos Photo Uploaded By: Alric Quan on 05/15/2015 17:50:25 Wound Measurements Length: (cm) 0.8 Width: (cm) 0.8 Depth: (cm) 0.1 Area: (cm)  0.503 Volume: (cm) 0.05 % Reduction in Area: 0% % Reduction in Volume: 0% Epithelialization: Small (1-33%) Tunneling: No Undermining: No Wound Description Classification: Partial Thickness Foul Odor A Diabetic Severity (Wagner): Grade 1 Wound Margin: Flat and Intact Exudate Amount: Medium Exudate Type: Serous Exudate Color: amber fter Cleansing: No Wound Bed Granulation Amount: Large (67-100%) Exposed Structure Granulation Quality: Red Fascia Exposed: No [name], [name] ([id number]) Necrotic Amount: None Present (0%) Fat Layer Exposed: No Tendon Exposed: No Muscle Exposed: No Joint Exposed: No Bone Exposed: No Limited to Skin Breakdown Periwound Skin Texture Texture Color No Abnormalities Noted: No No Abnormalities Noted: No Callus: No Atrophie Blanche: No Crepitus: No Cyanosis: No Excoriation: No Ecchymosis: No Fluctuance: No Erythema: No Friable: No Hemosiderin Staining: No Induration: No Mottled: No Localized Edema: No Pallor: No Rash: No Rubor: No Scarring: No Temperature / Pain Moisture Tenderness on Palpation: Yes No Abnormalities Noted: No Dry / Scaly: No Maceration: No Moist: Yes Wound Preparation Ulcer Cleansing: Other: soap and water, Topical Anesthetic Applied: Other: lidocaine 4%, Treatment Notes Wound #3 (Right, Proximal, Medial Lower Leg) 1. Cleansed with: Cleanse wound with antibacterial soap and water 2. Anesthetic Topical Lidocaine 4% cream to wound bed prior to debridement 3. Peri-wound Care: Skin Prep 4. Dressing Applied: Other dressing (specify in notes) 7. Secured with Tape 2 Layer Lite Compression System - Right Lower Extremity Notes mepitel one, steri-strips Electronic Signature(s) [name], [name] ([id number]) Signed: 05/15/2015 5:45:24 PM By: Alric Quan Entered By: Alric Quan on 05/15/2015 16:13:11 Mey, [name]  ([id number]) -------------------------------------------------------------------------------- Vitals Details Patient Name: Hopple, [name] Date of Service: 05/15/2015 3:45 PM Medical Record Number: [id number] Patient Account Number: [id number] Date of Birth/Sex: [birth year] (82 y.o. Female) Treating RN: Carolyne Fiscal, Debi Primary Care Physician: Ria Bush Other Clinician: Referring Physician: Ria Bush Treating Physician/Extender: Frann Rider in Treatment: 39 Vital Signs Time Taken: 15:57 Temperature (F): 98.2 Height (in): 58 Pulse (bpm): 67 Weight (lbs): 151 Respiratory Rate (breaths/min): 20 Body Mass Index (BMI): 31.6 Blood Pressure (mmHg): 156/63 Reference Range: 80 - 120 mg / dl Electronic Signature(s) Signed: 05/15/2015 5:45:24 PM By: Alric Quan Entered By: Alric Quan on 05/15/2015 16:02:52

## 2015-05-16 NOTE — Progress Notes (Addendum)
LORELLE, [name] ([id number]) Visit Report for 05/15/2015 Chief Complaint Document Details Patient Name: [name], [name] Date of Service: 05/15/2015 3:45 PM Medical Record Number: [id number] Patient Account Number: [id number] Date of Birth/Sex: [birth year] (82 y.o. Female) Treating RN: Carolyne Fiscal, Debi Primary Care Physician: Ria Bush Other Clinician: Referring Physician: Ria Bush Treating Physician/Extender: Frann Rider in Treatment: 39 Information Obtained from: Patient Chief Complaint Patient presents for treatment of an open ulcer due to venous insufficiency. 82 year old patient who is known to me from the [facility] wound care center now comes in follow up here for a chronic venous ulceration of her right lower extremity which she's had for about 6 months. Electronic Signature(s) Signed: 05/15/2015 4:25:57 PM By: Christin Fudge MD, FACS Previous Signature: 05/15/2015 3:55:09 PM Version By: Christin Fudge MD, FACS Entered By: Christin Fudge on 05/15/2015 16:25:57 Berryman, [name] ([id number]) -------------------------------------------------------------------------------- HPI Details Patient Name: [name], [name] Date of Service: 05/15/2015 3:45 PM Medical Record Number: [id number] Patient Account Number: [id number] Date of Birth/Sex: 05-Feb-1928 (82 y.o. Female) Treating RN: Carolyne Fiscal, Debi Primary Care Physician: Ria Bush Other Clinician: Referring Physician: Ria Bush Treating Physician/Extender: Frann Rider in Treatment: 39 History of Present Illness Location: ulcer in the right lower extremity for several months Quality: Patient reports experiencing a dull pain to affected area(s). Severity: Patient states wound (s) are getting better. Duration: Patient has had the wound for > 3 months prior to seeking treatment at the wound center Timing: Pain in wound is Intermittent (comes and goes Context: The wound appeared gradually  over time Modifying Factors: Consults to this date include: vascular surgery for the veins in December 2015 and she's been seeing the wound care center at [facility] for several months. Associated Signs and Symptoms: Patient reports having difficulty standing for long periods. HPI Description: This is an 82 yrs old female we have been following since the beginning of January for a wound on the right medial ankle. She had previously had venous ablation of the greater saphenous vein by Dr. Kellie Simmering. She came to [place] with erythema of the right leg and subsequently is opened in the medial aspect of the right ankle. She has not been very compliant with compression. We ordered her at juxta lite stocking, however she apparently has not been able to wear it. The area on the medial aspect of her left ankle has much less induration and erythema. The edema is better controlled. No evidence of infection is noted. 07/18/14; no major change in the condition of this wound. There is still surface slough and probably some drainage. 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 All other veins showed no evidence of DVT, superficial venous thrombosis or incompetence. however she does have manifestations of chronic venous hypertension and I believe we will change her dressing over to Prisma and a Profore light dressing. she sees her primary care doctor regularly and her last hemoglobin A1c has been around 7%.her other comorbidities are hypertension, CABG in 1998, peptic ulcer disease, history of echo with a ejection fraction of 55-60% and grade 1 diastolic dysfunction. past surgical history include coronary artery bypass graft in 1998, cholecystectomy, appendectomy, hand surgery, endovenous ablation of the saphenous vein with laser by Dr. Kellie Simmering in December 2015. 08/29/2014 - she complains of increased  calf pain over the past week. removed her compression bandage and pain improved. no fever or chills.  Mild clear drainage. 09/04/2014 -- she has not been compliant and has not done a dressing change as we had ordered last week. She always complains and does not keep her wrap on and this past week she has done the same. She has completed a course of doxycycline. 09/25/2014 -- she has tolerated her dressing all week and has not removed it. She says it's hurting a lot today and would not like any debridement. We will do so as per her wishes. she did get a appointment with the vascular surgeon and sees him on 6/28. BLODWYN, ELSAYED ([id number]) 10/03/2014 - She was seen by Dr. Tinnie Gens 09/30/2014. Assessment: I reviewed the previous venous duplex exam study performed in September 2015 which reveals no options for treating this venous ulcer. There is no significant superficial venous reflux to treat with laser ablation.Most recent ABI was 0.91 in the right foot with palpable dorsalis pedis pulse at 3+ Plan: patient needs to return to the [place] wound center and be treated by Dr. Con Memos. No arterial or venous options available from vascular standpoint or treatment of right leg stasis ulcer.Continued dressing changes and compression is the treatment of choice 10/16/2014 -- today will be her first application of Apligraf. 10/23/2014 -- we are going to be able to apply her second day of Apligraf next week. She is complaining of some tenderness in the region of Achilles tendon and this is possibly due to her wrap. 11/06/2014 -- she did not have Apligraf last week because the wound was not looking good but she says she is feeling much better now. 11/13/2014 -- she has been doing well this week and she is here for her second Apligraf. 11/27/2014 -- she is here for her third application of Apligraf 12/12/2014 -- she is here for her fourth application of Apligraf. Also during these past 3 days she  had an injury to her left forearm which she caught against the edge of her desk and has a lacerated wound which has gone down to the subcutaneous tissue. 12/26/2014 -- she is here for a last/fifth application of Apligraf. 04/10/2015 -- the patient is here for her first application of Puraply. 04/17/2015 -- the patient is here for her second application of Puraply. 04/24/2015 -- her Puraply was not ordered for today and hence he would use a collagen dressing locally. 05/01/2015 -- the patient is here for her third application of Puraply. 05/08/2015 -- the patient has been doing fine and overall has shown much improvement and is here for a routine visit 05/15/2015 -- she was admitted to the hospital with viral gastroenteritis and acute renal failure with dehydration and has been on supportive care since February 7. She was discharged today and has come for her wound care. Details of her hospital admission and therapy reviewed. Electronic Signature(s) Signed: 05/15/2015 4:26:50 PM By: Christin Fudge MD, FACS Previous Signature: 05/15/2015 3:56:26 PM Version By: Christin Fudge MD, FACS Entered By: Christin Fudge on 05/15/2015 16:26:50 Rack, [name] ([id number]) -------------------------------------------------------------------------------- Physical Exam Details Patient Name: [name], [name] Date of Service: 05/15/2015 3:45 PM Medical Record Number: [id number] Patient Account Number: [id number] Date of Birth/Sex: 01-Mar-1928 (82 y.o. Female) Treating RN: Carolyne Fiscal, Debi Primary Care Physician: Ria Bush Other Clinician: Referring Physician: Ria Bush Treating Physician/Extender: Frann Rider in Treatment: 39 Constitutional . Pulse regular. Respirations normal and unlabored. Afebrile. . Eyes Nonicteric. Reactive to light. Ears, Nose, Mouth, and Throat Lips, teeth, and gums WNL.Marland Kitchen Moist mucosa without lesions. Neck supple and nontender.  No palpable supraclavicular or  cervical adenopathy. Normal sized without goiter. Respiratory WNL. No retractions.. Cardiovascular Pedal Pulses WNL. No clubbing, cyanosis or edema. Gastrointestinal (GI) Abdomen without masses or tenderness.. No liver or spleen enlargement or tenderness.. Lymphatic No adneopathy. No adenopathy. No adenopathy. Musculoskeletal Adexa without tenderness or enlargement.. Digits and nails w/o clubbing, cyanosis, infection, petechiae, ischemia, or inflammatory conditions.. Integumentary (Hair, Skin) No suspicious lesions. No crepitus or fluctuance. No peri-wound warmth or erythema. No masses.Marland Kitchen Psychiatric Judgement and insight Intact.. No evidence of depression, anxiety, or agitation.. Notes the wound continues to improve and there is a small open area which will be treated appropriately with collagen and local compression as before. Electronic Signature(s) Signed: 05/15/2015 4:27:23 PM By: Christin Fudge MD, FACS Previous Signature: 05/15/2015 3:56:57 PM Version By: Christin Fudge MD, FACS Entered By: Christin Fudge on 05/15/2015 16:27:23 Eddie, [name] ([id number]) -------------------------------------------------------------------------------- Physician Orders Details Patient Name: [name], [name] Date of Service: 05/15/2015 3:45 PM Medical Record Number: [id number] Patient Account Number: [id number] Date of Birth/Sex: [birth year] (82 y.o. Female) Treating RN: Carolyne Fiscal, Debi Primary Care Physician: Ria Bush Other Clinician: Referring Physician: Ria Bush Treating Physician/Extender: Frann Rider in Treatment: 57 Verbal / Phone Orders: Yes Clinician: Carolyne Fiscal, Debi Read Back and Verified: Yes Diagnosis Coding ICD-10 Coding Code Description E11.622 Type 2 diabetes mellitus with other skin ulcer E11.610 Type 2 diabetes mellitus with diabetic neuropathic arthropathy Chronic venous hypertension (idiopathic) with ulcer and inflammation of right  lower I87.331 extremity S41.112A Laceration without foreign body of left upper arm, initial encounter Wound Cleansing Wound #1 Right,Medial Lower Leg o Cleanse wound with mild soap and water Anesthetic Wound #1 Right,Medial Lower Leg o Topical Lidocaine 4% cream applied to wound bed prior to debridement Skin Barriers/Peri-Wound Care Wound #1 Right,Medial Lower Leg o Skin Prep Primary Wound Dressing Wound #1 Right,Medial Lower Leg o Mepitel One - strei-strips o Other: - collagen dressing Secondary Dressing Wound #1 Right,Medial Lower Leg o ABD pad o Foam - to heel Dressing Change Frequency Wound #1 Right,Medial Lower Leg o Change dressing every week Froio, Jerica C. ([id number]) Follow-up Appointments Wound #1 Right,Medial Lower Leg o Return Appointment in 1 week. Edema Control Wound #1 Right,Medial Lower Leg o 2 Layer Compression System - Right Lower Extremity o Elevate legs to the level of the heart and pump ankles as often as possible Electronic Signature(s) Signed: 05/15/2015 4:42:12 PM By: Christin Fudge MD, FACS Signed: 05/15/2015 5:45:24 PM By: Alric Quan Entered By: Alric Quan on 05/15/2015 16:34:20 Ayars, [name] ([id number]) -------------------------------------------------------------------------------- Problem List Details Patient Name: [name], [name] Date of Service: 05/15/2015 3:45 PM Medical Record Number: [id number] Patient Account Number: [id number] Date of Birth/Sex: 24-Oct-1927 (82 y.o. Female) Treating RN: Carolyne Fiscal, Debi Primary Care Physician: Ria Bush Other Clinician: Referring Physician: Ria Bush Treating Physician/Extender: Frann Rider in Treatment: 73 Active Problems ICD-10 Encounter Code Description Active Date Diagnosis E11.622 Type 2 diabetes mellitus with other skin ulcer 08/15/2014 Yes E11.610 Type 2 diabetes mellitus with diabetic neuropathic 08/15/2014  Yes arthropathy I87.331 Chronic venous hypertension (idiopathic) with ulcer and 08/15/2014 Yes inflammation of right lower extremity S41.112A Laceration without foreign body of left upper arm, initial 12/12/2014 Yes encounter Inactive Problems Resolved Problems ICD-10 Code Description Active Date Resolved Date L03.115 Cellulitis of right lower limb 08/29/2014 08/29/2014 Electronic Signature(s) Signed: 05/15/2015 4:25:33 PM By: Christin Fudge MD, FACS Previous Signature: 05/15/2015 3:55:02 PM Version By: Christin Fudge MD, FACS Entered By: Christin Fudge on 05/15/2015 16:25:33 Sammon, Stellar Loletha Grayer ([id number]) -------------------------------------------------------------------------------- Progress  Note Details Patient Name: [name], [name] Date of Service: 05/15/2015 3:45 PM Medical Record Number: [id number] Patient Account Number: [id number] Date of Birth/Sex: December 30, 1927 (82 y.o. Female) Treating RN: Carolyne Fiscal, Debi Primary Care Physician: Ria Bush Other Clinician: Referring Physician: Ria Bush Treating Physician/Extender: Frann Rider in Treatment: 39 Subjective Chief Complaint Information obtained from Patient Patient presents for treatment of an open ulcer due to venous insufficiency. 82 year old patient who is known to me from the [facility] wound care center now comes in follow up here for a chronic venous ulceration of her right lower extremity which she's had for about 6 months. History of Present Illness (HPI) The following HPI elements were documented for the patient's wound: Location: ulcer in the right lower extremity for several months Quality: Patient reports experiencing a dull pain to affected area(s). Severity: Patient states wound (s) are getting better. Duration: Patient has had the wound for > 3 months prior to seeking treatment at the wound center Timing: Pain in wound is Intermittent (comes and goes Context: The wound appeared gradually over  time Modifying Factors: Consults to this date include: vascular surgery for the veins in December 2015 and she's been seeing the wound care center at [facility] for several months. Associated Signs and Symptoms: Patient reports having difficulty standing for long periods. This is an 82 yrs old female we have been following since the beginning of January for a wound on the right medial ankle. She had previously had venous ablation of the greater saphenous vein by Dr. Kellie Simmering. She came to [place] with erythema of the right leg and subsequently is opened in the medial aspect of the right ankle. She has not been very compliant with compression. We ordered her at juxta lite stocking, however she apparently has not been able to wear it. The area on the medial aspect of her left ankle has much less induration and erythema. The edema is better controlled. No evidence of infection is noted. 07/18/14; no major change in the condition of this wound. There is still surface slough and probably some drainage. 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 All other veins showed no evidence of DVT, superficial venous thrombosis or incompetence. however she does have manifestations of chronic venous hypertension and I believe we will change her dressing over to Prisma and a Profore light dressing. she sees her primary care doctor regularly and her last hemoglobin A1c has been around 7%.her other comorbidities are hypertension, CABG in 1998, peptic ulcer disease, history of echo with a ejection fraction of 55-60% and grade 1 diastolic dysfunction. past surgical history include coronary artery bypass graft in 1998, cholecystectomy, appendectomy, hand surgery, endovenous ablation of the saphenous vein with laser by Dr. Kellie Simmering in December 2015. ALEYNAH, [name] 08/29/2014 - she complains of  increased calf pain over the past week. removed her compression bandage and pain improved. no fever or chills. Mild clear drainage. 09/04/2014 -- she has not been compliant and has not done a dressing change as we had ordered last week. She always complains and does not keep her wrap on and this past week she has done the same. She has completed a course of doxycycline. 09/25/2014 -- she has tolerated her dressing all week and has not removed it. She says it's hurting a lot today and would not like any debridement. We will do so as  per her wishes. she did get a appointment with the vascular surgeon and sees him on 6/28. 10/03/2014 - She was seen by Dr. Tinnie Gens 09/30/2014. Assessment: I reviewed the previous venous duplex exam study performed in September 2015 which reveals no options for treating this venous ulcer. There is no significant superficial venous reflux to treat with laser ablation.Most recent ABI was 0.91 in the right foot with palpable dorsalis pedis pulse at 3+ Plan: patient needs to return to the [place] wound center and be treated by Dr. Con Memos. No arterial or venous options available from vascular standpoint or treatment of right leg stasis ulcer.Continued dressing changes and compression is the treatment of choice 10/16/2014 -- today will be her first application of Apligraf. 10/23/2014 -- we are going to be able to apply her second day of Apligraf next week. She is complaining of some tenderness in the region of Achilles tendon and this is possibly due to her wrap. 11/06/2014 -- she did not have Apligraf last week because the wound was not looking good but she says she is feeling much better now. 11/13/2014 -- she has been doing well this week and she is here for her second Apligraf. 11/27/2014 -- she is here for her third application of Apligraf 12/12/2014 -- she is here for her fourth application of Apligraf. Also during these past 3 days she had an injury to her  left forearm which she caught against the edge of her desk and has a lacerated wound which has gone down to the subcutaneous tissue. 12/26/2014 -- she is here for a last/fifth application of Apligraf. 04/10/2015 -- the patient is here for her first application of Puraply. 04/17/2015 -- the patient is here for her second application of Puraply. 04/24/2015 -- her Puraply was not ordered for today and hence he would use a collagen dressing locally. 05/01/2015 -- the patient is here for her third application of Puraply. 05/08/2015 -- the patient has been doing fine and overall has shown much improvement and is here for a routine visit 05/15/2015 -- she was admitted to the hospital with viral gastroenteritis and acute renal failure with dehydration and has been on supportive care since February 7. She was discharged today and has come for her wound care. Details of her hospital admission and therapy reviewed. Objective Constitutional Pulse regular. Respirations normal and unlabored. Afebrile. Matherne, Paulina C. ([id number]) Vitals Time Taken: 3:57 PM, Height: 58 in, Weight: 151 lbs, BMI: 31.6, Temperature: 98.2 F, Pulse: 67 bpm, Respiratory Rate: 20 breaths/min, Blood Pressure: 156/63 mmHg. Eyes Nonicteric. Reactive to light. Ears, Nose, Mouth, and Throat Lips, teeth, and gums WNL.Marland Kitchen Moist mucosa without lesions. Neck supple and nontender. No palpable supraclavicular or cervical adenopathy. Normal sized without goiter. Respiratory WNL. No retractions.. Cardiovascular Pedal Pulses WNL. No clubbing, cyanosis or edema. Gastrointestinal (GI) Abdomen without masses or tenderness.. No liver or spleen enlargement or tenderness.. Lymphatic No adneopathy. No adenopathy. No adenopathy. Musculoskeletal Adexa without tenderness or enlargement.. Digits and nails w/o clubbing, cyanosis, infection, petechiae, ischemia, or inflammatory conditions.Marland Kitchen Psychiatric Judgement and insight Intact.. No evidence  of depression, anxiety, or agitation.. General Notes: the wound continues to improve and there is a small open area which will be treated appropriately with collagen and local compression as before. Integumentary (Hair, Skin) No suspicious lesions. No crepitus or fluctuance. No peri-wound warmth or erythema. No masses.. Wound #1 status is Open. Original cause of wound was Gradually Appeared. The wound is located on the Right,Medial Lower Leg. The wound  measures 2.7cm length x 0.5cm width x 0.2cm depth; 1.06cm^2 area and 0.212cm^3 volume. The wound is limited to skin breakdown. There is no tunneling or undermining noted. There is a medium amount of serosanguineous drainage noted. The wound margin is distinct with the outline attached to the wound base. There is large (67-100%) red, pink granulation within the wound bed. There is a small (1-33%) amount of necrotic tissue within the wound bed including Adherent Slough. The periwound skin appearance exhibited: Localized Edema, Moist, Mottled, Erythema. The periwound skin appearance did not exhibit: Callus, Crepitus, Excoriation, Fluctuance, Friable, Induration, Rash, Scarring, Dry/Scaly, Maceration, Atrophie Blanche, Cyanosis, Ecchymosis, Hemosiderin Staining, Pallor, Rubor. The surrounding wound skin color is noted with erythema which is circumferential. Periwound temperature was noted as No Abnormality. The periwound has tenderness on palpation. [name], [name] Wound #3 status is Open. Original cause of wound was Gradually Appeared. The wound is located on the Right,Proximal,Medial Lower Leg. The wound measures 0.8cm length x 0.8cm width x 0.1cm depth; 0.503cm^2 area and 0.05cm^3 volume. The wound is limited to skin breakdown. There is no tunneling or undermining noted. There is a medium amount of serous drainage noted. The wound margin is flat and intact. There is large (67-100%) red granulation within the wound bed. There is no  necrotic tissue within the wound bed. The periwound skin appearance exhibited: Moist. The periwound skin appearance did not exhibit: Callus, Crepitus, Excoriation, Fluctuance, Friable, Induration, Localized Edema, Rash, Scarring, Dry/Scaly, Maceration, Atrophie Blanche, Cyanosis, Ecchymosis, Hemosiderin Staining, Mottled, Pallor, Rubor, Erythema. The periwound has tenderness on palpation. Assessment Active Problems ICD-10 E11.622 - Type 2 diabetes mellitus with other skin ulcer E11.610 - Type 2 diabetes mellitus with diabetic neuropathic arthropathy I87.331 - Chronic venous hypertension (idiopathic) with ulcer and inflammation of right lower extremity S41.112A - Laceration without foreign body of left upper arm, initial encounter Plan Wound Cleansing: Wound #1 Right,Medial Lower Leg: Cleanse wound with mild soap and water Anesthetic: Wound #1 Right,Medial Lower Leg: Topical Lidocaine 4% cream applied to wound bed prior to debridement Skin Barriers/Peri-Wound Care: Wound #1 Right,Medial Lower Leg: Skin Prep Primary Wound Dressing: Wound #1 Right,Medial Lower Leg: Mepitel One - strei-strips Other: - collagen dressing Secondary Dressing: Wound #1 Right,Medial Lower Leg: ABD pad Foam - to heel Dressing Change Frequency: Wound #1 Right,Medial Lower Leg: Peach, Fiorela C. ([id number]) Change dressing every week Follow-up Appointments: Wound #1 Right,Medial Lower Leg: Return Appointment in 1 week. Edema Control: Wound #1 Right,Medial Lower Leg: 2 Layer Compression System - Right Lower Extremity Elevate legs to the level of the heart and pump ankles as often as possible Today we will continue with collagen dressing over this much improved ulcer. We will continue with a 2 layer compression as it is all she will tolerate. He will come back and see as next week. Electronic Signature(s) Signed: 05/15/2015 4:43:49 PM By: Christin Fudge MD, FACS Previous Signature: 05/15/2015 4:28:07 PM  Version By: Christin Fudge MD, FACS Entered By: Christin Fudge on 05/15/2015 16:43:49 Snook, [name] ([id number]) -------------------------------------------------------------------------------- SuperBill Details Patient Name: [name], [name] Date of Service: 05/15/2015 Medical Record Number: [id number] Patient Account Number: [id number] Date of Birth/Sex: [birth year] (82 y.o. Female) Treating RN: Carolyne Fiscal, Debi Primary Care Physician: Ria Bush Other Clinician: Referring Physician: Ria Bush Treating Physician/Extender: Frann Rider in Treatment: 39 Diagnosis Coding ICD-10 Codes Code Description E11.622 Type 2 diabetes mellitus with other skin ulcer E11.610 Type 2 diabetes mellitus with diabetic neuropathic arthropathy Chronic venous hypertension (idiopathic) with ulcer and inflammation  of right lower I87.331 extremity S41.112A Laceration without foreign body of left upper arm, initial encounter Facility Procedures CPT4 Code: [id number] Description: 99213 - WOUND CARE VISIT-LEV 3 EST PT Modifier: Quantity: 1 Physician Procedures CPT4: Description Modifier Quantity Code [id number] - WC PHYS LEVEL 3 - EST PT 1 ICD-10 Description Diagnosis E11.622 Type 2 diabetes mellitus with other skin ulcer E11.610 Type 2 diabetes mellitus with diabetic neuropathic arthropathy I87.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

## 2015-05-18 ENCOUNTER — Ambulatory Visit: Payer: Medicare Other | Admitting: Internal Medicine

## 2015-05-18 ENCOUNTER — Telehealth: Payer: Self-pay

## 2015-05-18 ENCOUNTER — Telehealth: Payer: Self-pay | Admitting: Family Medicine

## 2015-05-18 ENCOUNTER — Ambulatory Visit (INDEPENDENT_AMBULATORY_CARE_PROVIDER_SITE_OTHER): Payer: Medicare Other | Admitting: Family Medicine

## 2015-05-18 ENCOUNTER — Encounter: Payer: Self-pay | Admitting: Family Medicine

## 2015-05-18 VITALS — BP 128/76 | HR 76 | Temp 98.0°F | Wt 152.5 lb

## 2015-05-18 DIAGNOSIS — A0811 Acute gastroenteropathy due to Norwalk agent: Secondary | ICD-10-CM | POA: Diagnosis not present

## 2015-05-18 DIAGNOSIS — E860 Dehydration: Secondary | ICD-10-CM | POA: Diagnosis not present

## 2015-05-18 DIAGNOSIS — I2510 Atherosclerotic heart disease of native coronary artery without angina pectoris: Secondary | ICD-10-CM

## 2015-05-18 DIAGNOSIS — F419 Anxiety disorder, unspecified: Secondary | ICD-10-CM

## 2015-05-18 DIAGNOSIS — R531 Weakness: Secondary | ICD-10-CM

## 2015-05-18 DIAGNOSIS — I9589 Other hypotension: Secondary | ICD-10-CM

## 2015-05-18 DIAGNOSIS — R1084 Generalized abdominal pain: Secondary | ICD-10-CM | POA: Diagnosis not present

## 2015-05-18 NOTE — Progress Notes (Signed)
Pre visit review using our clinic review tool, if applicable. No additional management support is needed unless otherwise documented below in the visit note. 

## 2015-05-18 NOTE — Assessment & Plan Note (Addendum)
Very high levels at baseline. Reassured today.

## 2015-05-18 NOTE — Assessment & Plan Note (Signed)
This has resolved.

## 2015-05-18 NOTE — Telephone Encounter (Signed)
This is an ER followup, needs a 30 minute appt

## 2015-05-18 NOTE — Assessment & Plan Note (Signed)
Residual discomfort from inflammation after recent gastroenteritis. Reassured.

## 2015-05-18 NOTE — Assessment & Plan Note (Signed)
Reviewed dx with patient and recommendations to prevent spread.  Home care discussed. Anticipated slowed continued recovery.

## 2015-05-18 NOTE — Assessment & Plan Note (Addendum)
Discussed anticipated slowed recovery after norovirus infection

## 2015-05-18 NOTE — Telephone Encounter (Signed)
Pt spoke with Vaughan Basta at front desk and pt scheduled with R Baity NP 05/18/15 at 3 PM.

## 2015-05-18 NOTE — Assessment & Plan Note (Signed)
Resolved with IVF. Back on antihypertensives.

## 2015-05-18 NOTE — Telephone Encounter (Signed)
Patient in today for office visit with PCP. Closing encounter.

## 2015-05-18 NOTE — Patient Instructions (Addendum)
I'm glad you are doing better. Continue pushing small sips of water throughout the day as well as slowly advancing diet as tolerated. Return in 3 months for medicare wellness visit

## 2015-05-18 NOTE — Telephone Encounter (Signed)
Spoke with pt, asked if she had spoken to [facility] nurse about her weakness, she states she had spoken to lots of people, explained that Dr. Darnell Level has not had any cancellations and that we can schedule with another provider.  Pt wants afternoons only.  She accepted appointment with Webb Silversmith for 3:00 pm today.

## 2015-05-18 NOTE — Progress Notes (Signed)
BP 128/76 mmHg  Pulse 76  Temp(Src) 98 F (36.7 C) (Oral)  Wt 152 lb 8 oz (69.174 kg)   CC: hosp f/u visit  Subjective:    [name], female    DOB: [birth year], 82 y.o.   MRN: [id number]  HPI: [name] is a 82 y.o. female presenting on 05/18/2015 for Weakness   See prior note and recent hospitalization for details. Had hosp f/u on Thursday, adamant about being seen today so worked in. 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 Testing returned positive for Norovirus. Declines nausea medication.   Has decided to continue dexilant despite increased cost.   Still no appetite. Ate 1 egg, pancake and coffee with cereal. Drinking apple juice. Some nausea last night.   DATE OF ADMISSION: 05/13/2015 DATE OF DISCHARGE: 05/15/2015 12:07 PM F/u phone call - not completed  Admission dx: dehydration Discharge dx: viral gastroenteritis, dehydration, malnutrition of moderate degree   Relevant past medical, surgical, family and social history reviewed and updated as indicated. Interim medical history since our last visit reviewed. Allergies and medications reviewed and updated. Current Outpatient Prescriptions on File Prior to Visit  Medication Sig  . acidophilus (RISAQUAD) CAPS capsule Take 1 capsule by mouth daily.  Marland Kitchen ALPRAZolam (XANAX) 1 MG tablet Take 0.5 mg by mouth 4 (four) times daily as needed for anxiety.   Marland Kitchen aspirin EC 81 MG tablet Take 81 mg by mouth daily.  Marland Kitchen Dexlansoprazole (DEXILANT) 30 MG capsule Take 1 capsule (30 mg total) by mouth daily.  . ferrous sulfate 325 (65 FE) MG tablet Take 1 tablet (325 mg total) by mouth daily with breakfast.  . furosemide (LASIX) 20 MG tablet Take 20 mg by mouth daily.  Marland Kitchen glucosamine-chondroitin 500-400 MG tablet Take 1 tablet by mouth daily.   Marland Kitchen HYDROcodone-acetaminophen (NORCO/VICODIN) 5-325 MG tablet Take 0.5-1 tablets by mouth every 6 (six) hours as needed for moderate  pain.  Marland Kitchen loperamide (IMODIUM) 2 MG capsule Take 2 mg by mouth 4 (four) times daily as needed for diarrhea or loose stools.  . nitroGLYCERIN (NITROSTAT) 0.4 MG SL tablet Place 0.4 mg under the tongue every 5 (five) minutes as needed for chest pain.   Marland Kitchen nystatin-triamcinolone ointment (MYCOLOG) Apply 1 application topically 2 (two) times daily as needed (for itching).  . pioglitazone (ACTOS) 30 MG tablet Take 30 mg by mouth every evening.   . pravastatin (PRAVACHOL) 20 MG tablet Take 20 mg by mouth at bedtime.  . ramipril (ALTACE) 5 MG capsule Take 5 mg by mouth every evening.   . traZODone (DESYREL) 150 MG tablet Take 150 mg by mouth at bedtime.   . vitamin B-12 (CYANOCOBALAMIN) 1000 MCG tablet Take 1,000 mcg by mouth daily.  . vitamin E 400 UNIT capsule Take 400 Units by mouth daily.   No current facility-administered medications on file prior to visit.    Review of Systems Per HPI unless specifically indicated in ROS section     Objective:    BP 128/76 mmHg  Pulse 76  Temp(Src) 98 F (36.7 C) (Oral)  Wt 152 lb 8 oz (69.174 kg)  Wt Readings from Last 3 Encounters:  05/18/15 152 lb 8 oz (69.174 kg)  05/13/15 148 lb 6 oz (67.302 kg)  05/13/15 149 lb 8 oz (67.813 kg)    Physical Exam  Constitutional: She appears well-developed and well-nourished. No distress.  HENT:  Mouth/Throat: Oropharynx is clear and moist. No oropharyngeal exudate.  Cardiovascular: Normal rate, regular rhythm, normal heart sounds and intact distal pulses.   No murmur heard. Pulmonary/Chest: Effort normal and breath sounds normal. No respiratory distress. She has no wheezes. She has no rales.  Abdominal: Soft. Bowel sounds are normal. She exhibits no distension and no mass. There is generalized tenderness (mild). There is no rebound and no guarding.  Musculoskeletal: She exhibits edema (minimal).  Minimal pedal edema  Skin: Skin is warm and dry.  Psychiatric: She has a normal mood and affect.  Nursing note  and vitals reviewed.   Lab Results  Component Value Date   CREATININE 0.65 05/15/2015       Assessment & Plan:   Problem List Items Addressed This Visit    Weakness    Discussed anticipated slowed recovery after norovirus infection      Viral gastroenteritis due to [place] virus - Primary    Reviewed dx with patient and recommendations to prevent spread.  Home care discussed. Anticipated slowed continued recovery.      RESOLVED: Hypotension    Resolved with IVF. Back on antihypertensives.      RESOLVED: Dehydration    This has resolved.      Anxiety    Very high levels at baseline. Reassured today.      Abdominal pain    Residual discomfort from inflammation after recent gastroenteritis. Reassured.          Follow up plan: Return in about 3 months (around 08/15/2015), or as needed, for medicare wellness visit.

## 2015-05-18 NOTE — Telephone Encounter (Signed)
Pt called at 9 am, insistent to see PCP.  No availability, offered appointments with any other provider, pt declined.  Transferred pt to [facility] for weakness, pt scheduled with PCP for hospital follow up on 05/21/15 9:30 am, pt wanted to reschedule to an afternoon appointment, says she "cannot make" that early of an appointment.  Explained Dr. Danise Mina does not have any other 30 min slot except same day at 9:00 am.   She states she will keep appointment.

## 2015-05-18 NOTE — Telephone Encounter (Signed)
Pt blind blind transferred [facility].  Pt insisted on seeing Dr Danise Mina today, being very difficult when I told her that there were no openings until her Thursday appt. I offered her another appt with a different providers, and pt refused. I spoke with Maudie Mercury and she confirmed that there were no available appts.  Pt continued to be difficult; transferred her to front office supervisor for further continuation of her issue.

## 2015-05-18 NOTE — Telephone Encounter (Signed)
PLEASE NOTE: All timestamps contained within this report are represented as [place] Standard Time. CONFIDENTIALTY NOTICE: This fax transmission is intended only for the addressee. It contains information that is legally privileged, confidential or otherwise protected from use or disclosure. If you are not the intended recipient, you are strictly prohibited from reviewing, disclosing, copying using or disseminating any of this information or taking any action in reliance on or regarding this information. If you have received this fax in error, please notify us immediately by telephone so that we can arrange for its return to [place]. Phone: [phone], Toll-Free: [phone], Fax: [phone] Page: 1 of 1 Call Id: [id number] [place] [facility] - Client Nonclinical Telephone Record [place] [facility] - Client Client Site [facility] - Day Physician Ria Bush Contact Type Call Who Is Calling Patient / Member / Family / Caregiver Caller Name Declined to provide Caller Phone Number Declined to provide Call Type Message Only Information Provided Reason for Call Request for General Office Information Initial Comment Caller states has been in hospital, needing follow up Additional Comment Call Closed By: Jearld Shines Transaction Date/Time: 05/18/2015 9:19:44 AM (ET)

## 2015-05-21 ENCOUNTER — Ambulatory Visit: Payer: Medicare Other | Admitting: Family Medicine

## 2015-05-22 ENCOUNTER — Encounter (HOSPITAL_BASED_OUTPATIENT_CLINIC_OR_DEPARTMENT_OTHER): Payer: Medicare Other | Admitting: General Surgery

## 2015-05-22 ENCOUNTER — Encounter: Payer: Self-pay | Admitting: General Surgery

## 2015-05-22 DIAGNOSIS — E11610 Type 2 diabetes mellitus with diabetic neuropathic arthropathy: Secondary | ICD-10-CM | POA: Diagnosis not present

## 2015-05-22 DIAGNOSIS — I10 Essential (primary) hypertension: Secondary | ICD-10-CM | POA: Diagnosis not present

## 2015-05-22 DIAGNOSIS — E11622 Type 2 diabetes mellitus with other skin ulcer: Secondary | ICD-10-CM | POA: Diagnosis not present

## 2015-05-22 DIAGNOSIS — L97229 Non-pressure chronic ulcer of left calf with unspecified severity: Principal | ICD-10-CM

## 2015-05-22 DIAGNOSIS — L97811 Non-pressure chronic ulcer of other part of right lower leg limited to breakdown of skin: Secondary | ICD-10-CM | POA: Diagnosis not present

## 2015-05-22 DIAGNOSIS — I83222 Varicose veins of left lower extremity with both ulcer of calf and inflammation: Secondary | ICD-10-CM

## 2015-05-22 DIAGNOSIS — I87331 Chronic venous hypertension (idiopathic) with ulcer and inflammation of right lower extremity: Secondary | ICD-10-CM | POA: Diagnosis not present

## 2015-05-22 DIAGNOSIS — S41112A Laceration without foreign body of left upper arm, initial encounter: Secondary | ICD-10-CM | POA: Diagnosis not present

## 2015-05-22 DIAGNOSIS — I2510 Atherosclerotic heart disease of native coronary artery without angina pectoris: Secondary | ICD-10-CM | POA: Diagnosis not present

## 2015-05-22 NOTE — Progress Notes (Signed)
seeiheal 

## 2015-05-29 ENCOUNTER — Encounter: Payer: Medicare Other | Admitting: Surgery

## 2015-05-29 DIAGNOSIS — I10 Essential (primary) hypertension: Secondary | ICD-10-CM | POA: Diagnosis not present

## 2015-05-29 DIAGNOSIS — L97811 Non-pressure chronic ulcer of other part of right lower leg limited to breakdown of skin: Secondary | ICD-10-CM | POA: Diagnosis not present

## 2015-05-29 DIAGNOSIS — S41112A Laceration without foreign body of left upper arm, initial encounter: Secondary | ICD-10-CM | POA: Diagnosis not present

## 2015-05-29 DIAGNOSIS — I87331 Chronic venous hypertension (idiopathic) with ulcer and inflammation of right lower extremity: Secondary | ICD-10-CM | POA: Diagnosis not present

## 2015-05-29 DIAGNOSIS — E11622 Type 2 diabetes mellitus with other skin ulcer: Secondary | ICD-10-CM | POA: Diagnosis not present

## 2015-05-29 DIAGNOSIS — E11610 Type 2 diabetes mellitus with diabetic neuropathic arthropathy: Secondary | ICD-10-CM | POA: Diagnosis not present

## 2015-05-30 NOTE — Progress Notes (Signed)
[name] [name] ([id number]) Visit Report for 05/29/2015 Chief Complaint Document Details Patient Name: [name] [name] C. Date of Service: 05/29/2015 2:45 PM Medical Record Number: [id number] Patient Account Number: [id number] Date of Birth/Sex: Oct 14, 1927 (82 y.o. Female) Treating RN: Carolyne Fiscal, Debi Primary Care Physician: Ria Bush Other Clinician: Referring Physician: Ria Bush Treating Physician/Extender: Frann Rider in Treatment: 80 Information Obtained from: Patient Chief Complaint Patient presents for treatment of an open ulcer due to venous insufficiency. 82 year old patient who is known to me from the [facility] wound care center now comes in follow up here for a chronic venous ulceration of her right lower extremity which she's had for about 6 months. Electronic Signature(s) Signed: 05/29/2015 3:39:18 PM By: Christin Fudge MD, FACS Entered By: Christin Fudge on 05/29/2015 15:39:18 [name] [name] Mares ([id number]) -------------------------------------------------------------------------------- HPI Details Patient Name: [name] [name] C. Date of Service: 05/29/2015 2:45 PM Medical Record Number: [id number] Patient Account Number: [id number] Date of Birth/Sex: [birth year] (82 y.o. Female) Treating RN: Carolyne Fiscal, Debi Primary Care Physician: Ria Bush Other Clinician: Referring Physician: Ria Bush Treating Physician/Extender: Frann Rider in Treatment: 34 History of Present Illness Location: ulcer in the right lower extremity for several months Quality: Patient reports experiencing a dull pain to affected area(s). Severity: Patient states wound (s) are getting better. Duration: Patient has had the wound for > 3 months prior to seeking treatment at the wound center Timing: Pain in wound is Intermittent (comes and goes Context: The wound appeared gradually over time Modifying Factors: Consults to this date include: vascular surgery  for the veins in December 2015 and she's been seeing the wound care center at [facility] for several months. Associated Signs and Symptoms: Patient reports having difficulty standing for long periods. HPI Description: This is an 82 yrs old female we have been following since the beginning of January for a wound on the right medial ankle. She had previously had venous ablation of the greater saphenous vein by Dr. Kellie Simmering. She came to [place] with erythema of the right leg and subsequently is opened in the medial aspect of the right ankle. She has not been very compliant with compression. We ordered her at juxta lite stocking, however she apparently has not been able to wear it. The area on the medial aspect of her left ankle has much less induration and erythema. The edema is better controlled. No evidence of infection is noted. 07/18/14; no major change in the condition of this wound. There is still surface slough and probably some drainage. 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 All other veins showed no evidence of DVT, superficial venous thrombosis or incompetence. however she does have manifestations of chronic venous hypertension and I believe we will change her dressing over to Prisma and a Profore light dressing. she sees her primary care doctor regularly and her last hemoglobin A1c has been around 7%.her other comorbidities are hypertension, CABG in 1998, peptic ulcer disease, history of echo with a ejection fraction of 55-60% and grade 1 diastolic dysfunction. past surgical history include coronary artery bypass graft in 1998, cholecystectomy, appendectomy, hand surgery, endovenous ablation of the saphenous vein with laser by Dr. Kellie Simmering in December 2015. 08/29/2014 - she complains of increased calf pain over the past week. removed her compression bandage and pain  improved. no fever or chills. Mild clear drainage. 09/04/2014 -- she has not been compliant and  has not done a dressing change as we had ordered last week. She always complains and does not keep her wrap on and this past week she has done the same. She has completed a course of doxycycline. 09/25/2014 -- she has tolerated her dressing all week and has not removed it. She says it's hurting a lot today and would not like any debridement. We will do so as per her wishes. she did get a appointment with the vascular surgeon and sees him on 6/28. [name] [name] ([id number]) 10/03/2014 - She was seen by Dr. Tinnie Gens 09/30/2014. Assessment: I reviewed the previous venous duplex exam study performed in September 2015 which reveals no options for treating this venous ulcer. There is no significant superficial venous reflux to treat with laser ablation.Most recent ABI was 0.91 in the right foot with palpable dorsalis pedis pulse at 3+ Plan: patient needs to return to the [place] wound center and be treated by Dr. Con Memos. No arterial or venous options available from vascular standpoint or treatment of right leg stasis ulcer.Continued dressing changes and compression is the treatment of choice 10/16/2014 -- today will be her first application of Apligraf. 10/23/2014 -- we are going to be able to apply her second day of Apligraf next week. She is complaining of some tenderness in the region of Achilles tendon and this is possibly due to her wrap. 11/06/2014 -- she did not have Apligraf last week because the wound was not looking good but she says she is feeling much better now. 11/13/2014 -- she has been doing well this week and she is here for her second Apligraf. 11/27/2014 -- she is here for her third application of Apligraf 12/12/2014 -- she is here for her fourth application of Apligraf. Also during these past 3 days she had an injury to her left forearm which she caught against the edge of  her desk and has a lacerated wound which has gone down to the subcutaneous tissue. 12/26/2014 -- she is here for a last/fifth application of Apligraf. 04/10/2015 -- the patient is here for her first application of Puraply. 04/17/2015 -- the patient is here for her second application of Puraply. 04/24/2015 -- her Puraply was not ordered for today and hence he would use a collagen dressing locally. 05/01/2015 -- the patient is here for her third application of Puraply. 05/08/2015 -- the patient has been doing fine and overall has shown much improvement and is here for a routine visit 05/15/2015 -- she was admitted to the hospital with viral gastroenteritis and acute renal failure with dehydration and has been on supportive care since February 7. She was discharged today and has come for her wound care. Details of her hospital admission and therapy reviewed. Electronic Signature(s) Signed: 05/29/2015 3:39:30 PM By: Christin Fudge MD, FACS Entered By: Christin Fudge on 05/29/2015 15:39:25 Elbert, [name] Mares ([id number]) -------------------------------------------------------------------------------- Physical Exam Details Patient Name: [name] [name] C. Date of Service: 05/29/2015 2:45 PM Medical Record Number: [id number] Patient Account Number: [id number] Date of Birth/Sex: 1927/05/09 (82 y.o. Female) Treating RN: Carolyne Fiscal, Debi Primary Care Physician: Ria Bush Other Clinician: Referring Physician: Ria Bush Treating Physician/Extender: Frann Rider in Treatment: 80 Constitutional . Pulse regular. Respirations normal and unlabored. Afebrile. . Eyes Nonicteric. Reactive to light. Ears, Nose, Mouth, and Throat Lips, teeth, and gums WNL.Marland Kitchen Moist mucosa without lesions. Neck supple and nontender. No palpable supraclavicular or cervical adenopathy. Normal sized without goiter. Respiratory WNL. No retractions.. Cardiovascular Pedal Pulses WNL. No clubbing, cyanosis or  edema. Lymphatic No adneopathy. No adenopathy. No adenopathy. Musculoskeletal Adexa without tenderness or enlargement.. Digits and nails w/o clubbing, cyanosis, infection, petechiae, ischemia, or inflammatory conditions.. Integumentary (Hair, Skin) No suspicious lesions. No crepitus or fluctuance. No peri-wound warmth or erythema. No masses.Marland Kitchen Psychiatric Judgement and insight Intact.. No evidence of depression, anxiety, or agitation.. Notes the wound on the right lower extremity medial part is almost completely healed and then may be some tiny area which is open but the scar is supple and there is no evidence of cellulitis. Electronic Signature(s) Signed: 05/29/2015 3:40:06 PM By: Christin Fudge MD, FACS Entered By: Christin Fudge on 05/29/2015 15:40:06 Struckman, [name] Mares ([id number]) -------------------------------------------------------------------------------- Physician Orders Details Patient Name: [name] [name] C. Date of Service: 05/29/2015 2:45 PM Medical Record Number: [id number] Patient Account Number: [id number] Date of Birth/Sex: Dec 18, 1927 (82 y.o. Female) Treating RN: Carolyne Fiscal, Debi Primary Care Physician: Ria Bush Other Clinician: Referring Physician: Ria Bush Treating Physician/Extender: Frann Rider in Treatment: 77 Verbal / Phone Orders: Yes Clinician: Carolyne Fiscal, Debi Read Back and Verified: Yes Diagnosis Coding Wound Cleansing Wound #1 Right,Medial Lower Leg o Cleanse wound with mild soap and water Anesthetic Wound #1 Right,Medial Lower Leg o Topical Lidocaine 4% cream applied to wound bed prior to debridement Skin Barriers/Peri-Wound Care Wound #1 Right,Medial Lower Leg o Skin Prep Primary Wound Dressing Wound #1 Right,Medial Lower Leg o Mepitel One Secondary Dressing Wound #1 Right,Medial Lower Leg o Dry Gauze o Foam - to heel Dressing Change Frequency Wound #1 Right,Medial Lower Leg o Change dressing every  week Follow-up Appointments Wound #1 Right,Medial Lower Leg o Return Appointment in 1 week. Edema Control Wound #1 Right,Medial Lower Leg o 2 Layer Lite Compression System - Right Lower Extremity o Elevate legs to the level of the heart and pump ankles as often as possible Macdonnell, Keyandra Loletha Grayer ([id number]) Electronic Signature(s) Signed: 05/29/2015 4:56:18 PM By: Christin Fudge MD, FACS Signed: 05/29/2015 5:53:24 PM By: Alric Quan Entered By: Alric Quan on 05/29/2015 15:15:56 Hnat, [name] Mares ([id number]) -------------------------------------------------------------------------------- Problem List Details Patient Name: [name] [name] C. Date of Service: 05/29/2015 2:45 PM Medical Record Number: [id number] Patient Account Number: [id number] Date of Birth/Sex: 1927-09-25 (82 y.o. Female) Treating RN: Carolyne Fiscal, Debi Primary Care Physician: Ria Bush Other Clinician: Referring Physician: Ria Bush Treating Physician/Extender: Frann Rider in Treatment: 44 Active Problems ICD-10 Encounter Code Description Active Date Diagnosis E11.622 Type 2 diabetes mellitus with other skin ulcer 08/15/2014 Yes E11.610 Type 2 diabetes mellitus with diabetic neuropathic 08/15/2014 Yes arthropathy I87.331 Chronic venous hypertension (idiopathic) with ulcer and 08/15/2014 Yes inflammation of right lower extremity S41.112A Laceration without foreign body of left upper arm, initial 12/12/2014 Yes encounter Inactive Problems Resolved Problems ICD-10 Code Description Active Date Resolved Date L03.115 Cellulitis of right lower limb 08/29/2014 08/29/2014 Electronic Signature(s) Signed: 05/29/2015 3:39:12 PM By: Christin Fudge MD, FACS Entered By: Christin Fudge on 05/29/2015 15:39:12 Lefever, [name] Mares ([id number]) -------------------------------------------------------------------------------- Progress Note Details Patient Name: [name] [name] C. Date of Service:  05/29/2015 2:45 PM Medical Record Number: [id number] Patient Account Number: [id number] Date of Birth/Sex: Oct 15, 1927 (82 y.o. Female) Treating RN: Carolyne Fiscal, Debi Primary Care Physician: Ria Bush Other Clinician: Referring Physician: Ria Bush Treating Physician/Extender: Frann Rider in Treatment: 64 Subjective Chief Complaint Information obtained from Patient Patient presents for treatment of an open ulcer due to venous insufficiency. 82 year old patient who is known to me from the [facility] wound care center now comes in follow up here for a chronic venous ulceration of her right lower extremity which  she's had for about 6 months. History of Present Illness (HPI) The following HPI elements were documented for the patient's wound: Location: ulcer in the right lower extremity for several months Quality: Patient reports experiencing a dull pain to affected area(s). Severity: Patient states wound (s) are getting better. Duration: Patient has had the wound for > 3 months prior to seeking treatment at the wound center Timing: Pain in wound is Intermittent (comes and goes Context: The wound appeared gradually over time Modifying Factors: Consults to this date include: vascular surgery for the veins in December 2015 and she's been seeing the wound care center at [facility] for several months. Associated Signs and Symptoms: Patient reports having difficulty standing for long periods. This is an 82 yrs old female we have been following since the beginning of January for a wound on the right medial ankle. She had previously had venous ablation of the greater saphenous vein by Dr. Kellie Simmering. She came to [place] with erythema of the right leg and subsequently is opened in the medial aspect of the right ankle. She has not been very compliant with compression. We ordered her at juxta lite stocking, however she apparently has not been able to wear it. The area on the medial aspect of  her left ankle has much less induration and erythema. The edema is better controlled. No evidence of infection is noted. 07/18/14; no major change in the condition of this wound. There is still surface slough and probably some drainage. 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 All other veins showed no evidence of DVT, superficial venous thrombosis or incompetence. however she does have manifestations of chronic venous hypertension and I believe we will change her dressing over to Prisma and a Profore light dressing. she sees her primary care doctor regularly and her last hemoglobin A1c has been around 7%.her other comorbidities are hypertension, CABG in 1998, peptic ulcer disease, history of echo with a ejection fraction of 55-60% and grade 1 diastolic dysfunction. past surgical history include coronary artery bypass graft in 1998, cholecystectomy, appendectomy, hand surgery, endovenous ablation of the saphenous vein with laser by Dr. Kellie Simmering in December 2015. ANNET, REHLING ([id number]) 08/29/2014 - she complains of increased calf pain over the past week. removed her compression bandage and pain improved. no fever or chills. Mild clear drainage. 09/04/2014 -- she has not been compliant and has not done a dressing change as we had ordered last week. She always complains and does not keep her wrap on and this past week she has done the same. She has completed a course of doxycycline. 09/25/2014 -- she has tolerated her dressing all week and has not removed it. She says it's hurting a lot today and would not like any debridement. We will do so as per her wishes. she did get a appointment with the vascular surgeon and sees him on 6/28. 10/03/2014 - She was seen by Dr. Tinnie Gens 09/30/2014. Assessment: I reviewed the previous venous duplex exam study performed in  September 2015 which reveals no options for treating this venous ulcer. There is no significant superficial venous reflux to treat with laser ablation.Most recent ABI was 0.91 in the right foot with palpable dorsalis pedis pulse at 3+ Plan: patient needs to return to the [place] wound center and be treated by Dr. Con Memos. No arterial or venous options available from vascular  standpoint or treatment of right leg stasis ulcer.Continued dressing changes and compression is the treatment of choice 10/16/2014 -- today will be her first application of Apligraf. 10/23/2014 -- we are going to be able to apply her second day of Apligraf next week. She is complaining of some tenderness in the region of Achilles tendon and this is possibly due to her wrap. 11/06/2014 -- she did not have Apligraf last week because the wound was not looking good but she says she is feeling much better now. 11/13/2014 -- she has been doing well this week and she is here for her second Apligraf. 11/27/2014 -- she is here for her third application of Apligraf 12/12/2014 -- she is here for her fourth application of Apligraf. Also during these past 3 days she had an injury to her left forearm which she caught against the edge of her desk and has a lacerated wound which has gone down to the subcutaneous tissue. 12/26/2014 -- she is here for a last/fifth application of Apligraf. 04/10/2015 -- the patient is here for her first application of Puraply. 04/17/2015 -- the patient is here for her second application of Puraply. 04/24/2015 -- her Puraply was not ordered for today and hence he would use a collagen dressing locally. 05/01/2015 -- the patient is here for her third application of Puraply. 05/08/2015 -- the patient has been doing fine and overall has shown much improvement and is here for a routine visit 05/15/2015 -- she was admitted to the hospital with viral gastroenteritis and acute renal failure with dehydration and has  been on supportive care since February 7. She was discharged today and has come for her wound care. Details of her hospital admission and therapy reviewed. Objective Constitutional Pulse regular. Respirations normal and unlabored. Afebrile. [name] [name] C. ([id number]) Vitals Time Taken: 2:38 PM, Height: 58 in, Weight: 151 lbs, BMI: 31.6, Temperature: 97.7 F, Pulse: 73 bpm, Respiratory Rate: 20 breaths/min, Blood Pressure: 114/54 mmHg. Eyes Nonicteric. Reactive to light. Ears, Nose, Mouth, and Throat Lips, teeth, and gums WNL.Marland Kitchen Moist mucosa without lesions. Neck supple and nontender. No palpable supraclavicular or cervical adenopathy. Normal sized without goiter. Respiratory WNL. No retractions.. Cardiovascular Pedal Pulses WNL. No clubbing, cyanosis or edema. Lymphatic No adneopathy. No adenopathy. No adenopathy. Musculoskeletal Adexa without tenderness or enlargement.. Digits and nails w/o clubbing, cyanosis, infection, petechiae, ischemia, or inflammatory conditions.Marland Kitchen Psychiatric Judgement and insight Intact.. No evidence of depression, anxiety, or agitation.. General Notes: the wound on the right lower extremity medial part is almost completely healed and then may be some tiny area which is open but the scar is supple and there is no evidence of cellulitis. Integumentary (Hair, Skin) No suspicious lesions. No crepitus or fluctuance. No peri-wound warmth or erythema. No masses.. Wound #1 status is Open. Original cause of wound was Gradually Appeared. The wound is located on the Right,Medial Lower Leg. The wound measures 1.6cm length x 0.5cm width x 0.2cm depth; 0.628cm^2 area and 0.126cm^3 volume. The wound is limited to skin breakdown. There is no tunneling or undermining noted. There is a medium amount of serosanguineous drainage noted. The wound margin is distinct with the outline attached to the wound base. There is large (67-100%) red, pink granulation within the  wound bed. There is a small (1-33%) amount of necrotic tissue within the wound bed including Adherent Slough. The periwound skin appearance exhibited: Localized Edema, Moist, Mottled, Erythema. The periwound skin appearance did not exhibit: Callus, Crepitus, Excoriation, Fluctuance, Friable, Induration, Rash, Scarring, Dry/Scaly,  Maceration, Atrophie Blanche, Cyanosis, Ecchymosis, Hemosiderin Staining, Pallor, Rubor. The surrounding wound skin color is noted with erythema which is circumferential. Periwound temperature was noted as No Abnormality. The periwound has tenderness on palpation. Wound #3 status is Open. Original cause of wound was Gradually Appeared. The wound is located on the Right,Proximal,Medial Lower Leg. The wound measures 0cm length x 0cm width x 0cm depth; 0cm^2 area [name] [name] C. ([id number]) and 0cm^3 volume. The wound is limited to skin breakdown. There is no tunneling or undermining noted. There is a medium amount of serous drainage noted. The wound margin is flat and intact. There is no granulation within the wound bed. There is no necrotic tissue within the wound bed. The periwound skin appearance did not exhibit: Callus, Crepitus, Excoriation, Fluctuance, Friable, Induration, Localized Edema, Rash, Scarring, Dry/Scaly, Maceration, Moist, Atrophie Blanche, Cyanosis, Ecchymosis, Hemosiderin Staining, Mottled, Pallor, Rubor, Erythema. Assessment Active Problems ICD-10 E11.622 - Type 2 diabetes mellitus with other skin ulcer E11.610 - Type 2 diabetes mellitus with diabetic neuropathic arthropathy I87.331 - Chronic venous hypertension (idiopathic) with ulcer and inflammation of right lower extremity S41.112A - Laceration without foreign body of left upper arm, initial encounter I have recommended a piece of Mepitel some gauze padding and a 2 layer compression which is all she will tolerate. I anticipate she will be healed soon. Plan Wound Cleansing: Wound #1  Right,Medial Lower Leg: Cleanse wound with mild soap and water Anesthetic: Wound #1 Right,Medial Lower Leg: Topical Lidocaine 4% cream applied to wound bed prior to debridement Skin Barriers/Peri-Wound Care: Wound #1 Right,Medial Lower Leg: Skin Prep Primary Wound Dressing: Wound #1 Right,Medial Lower Leg: Mepitel One Secondary Dressing: Wound #1 Right,Medial Lower Leg: Dry Gauze Foam - to heel Dressing Change Frequency: [name] [name] C. ([id number]) Wound #1 Right,Medial Lower Leg: Change dressing every week Follow-up Appointments: Wound #1 Right,Medial Lower Leg: Return Appointment in 1 week. Edema Control: Wound #1 Right,Medial Lower Leg: 2 Layer Lite Compression System - Right Lower Extremity Elevate legs to the level of the heart and pump ankles as often as possible I have recommended a piece of Mepitel some gauze padding and a 2 layer compression which is all she will tolerate. I anticipate she will be healed soon. Electronic Signature(s) Signed: 05/29/2015 3:40:51 PM By: Christin Fudge MD, FACS Entered By: Christin Fudge on 05/29/2015 15:40:51 Geppert, [name] Mares ([id number]) -------------------------------------------------------------------------------- SuperBill Details Patient Name: [name] [name] C. Date of Service: 05/29/2015 Medical Record Number: [id number] Patient Account Number: [id number] Date of Birth/Sex: 08-05-1927 (82 y.o. Female) Treating RN: Carolyne Fiscal, Debi Primary Care Physician: Ria Bush Other Clinician: Referring Physician: Ria Bush Treating Physician/Extender: Frann Rider in Treatment: 80 Diagnosis Coding ICD-10 Codes Code Description E11.622 Type 2 diabetes mellitus with other skin ulcer E11.610 Type 2 diabetes mellitus with diabetic neuropathic arthropathy Chronic venous hypertension (idiopathic) with ulcer and inflammation of right lower I87.331 extremity S41.112A Laceration without foreign body of left upper arm,  initial encounter Facility Procedures CPT4 Code: [id number] Description: 99213 - WOUND CARE VISIT-LEV 3 EST PT Modifier: Quantity: 1 Physician Procedures CPT4: Description Modifier Quantity Code [id number] 99213 - WC PHYS LEVEL 3 - EST PT 1 ICD-10 Description Diagnosis E11.622 Type 2 diabetes mellitus with other skin ulcer I87.331 Chronic venous hypertension (idiopathic) with ulcer and inflammation of right  lower extremity E11.610 Type 2 diabetes mellitus with diabetic neuropathic arthropathy Electronic Signature(s) Signed: 05/29/2015 4:56:18 PM By: Christin Fudge MD, FACS Signed: 05/29/2015 5:53:24 PM By: Alric Quan Previous Signature: 05/29/2015 3:41:15 PM Version By: Con Memos  Roderick Pee MD, FACS Entered By: Alric Quan on 05/29/2015 16:16:46

## 2015-05-30 NOTE — Progress Notes (Signed)
[name], [name] ([id number]) Visit Report for 05/29/2015 Arrival Information Details Patient Name: [name], [name] Date of Service: 05/29/2015 2:45 PM Medical Record Number: [id number] Patient Account Number: [id number] Date of Birth/Sex: [birth year] (82 y.o. Female) Treating RN: Carolyne Fiscal, Debi Primary Care Physician: Ria Bush Other Clinician: Referring Physician: Ria Bush Treating Physician/Extender: Frann Rider in Treatment: 59 Visit Information History Since Last Visit All ordered tests and consults were completed: No Patient Arrived: [name] Added or deleted any medications: No Arrival Time: 14:38 Any new allergies or adverse reactions: No Accompanied By: self Had a fall or experienced change in No Transfer Assistance: None activities of daily living that may affect Patient Identification Verified: Yes risk of falls: Secondary Verification Process Yes Signs or symptoms of abuse/neglect since last No Completed: visito Patient Requires Transmission- No Hospitalized since last visit: No Based Precautions: Pain Present Now: No Patient Has Alerts: Yes Patient Alerts: Patient on Blood Thinner Type II Diabetic 81 MG aspirin Electronic Signature(s) Signed: 05/29/2015 5:53:24 PM By: Alric Quan Entered By: Alric Quan on 05/29/2015 14:38:26 Breth, [name] -------------------------------------------------------------------------------- Clinic Level of Care Assessment Details Patient Name: [name], [name] Date of Service: 05/29/2015 2:45 PM Medical Record Number: [id number] Patient Account Number: [id number] Date of Birth/Sex: [birth year] (82 y.o. Female) Treating RN: Carolyne Fiscal, Debi Primary Care Physician: Ria Bush Other Clinician: Referring Physician: Ria Bush Treating Physician/Extender: Frann Rider in Treatment: 41 Clinic Level of Care Assessment Items TOOL 4 Quantity Score []  - Use when  only an EandM is performed on FOLLOW-UP visit 0 ASSESSMENTS - Nursing Assessment / Reassessment []  - Reassessment of Co-morbidities (includes updates in patient status) 0 X - Reassessment of Adherence to Treatment Plan 1 5 ASSESSMENTS - Wound and Skin Assessment / Reassessment X - Simple Wound Assessment / Reassessment - one wound 1 5 []  - Complex Wound Assessment / Reassessment - multiple wounds 0 []  - Dermatologic / Skin Assessment (not related to wound area) 0 ASSESSMENTS - Focused Assessment []  - Circumferential Edema Measurements - multi extremities 0 []  - Nutritional Assessment / Counseling / Intervention 0 []  - Lower Extremity Assessment (monofilament, tuning fork, pulses) 0 []  - Peripheral Arterial Disease Assessment (using hand held doppler) 0 ASSESSMENTS - Ostomy and/or Continence Assessment and Care []  - Incontinence Assessment and Management 0 []  - Ostomy Care Assessment and Management (repouching, etc.) 0 PROCESS - Coordination of Care X - Simple Patient / Family Education for ongoing care 1 15 []  - Complex (extensive) Patient / Family Education for ongoing care 0 []  - Staff obtains Programmer, systems, Records, Test Results / Process Orders 0 []  - Staff telephones HHA, Nursing Homes / Clarify orders / etc 0 []  - Routine Transfer to another Facility (non-emergent condition) 0 [name], [name] ([id number]) []  - Routine Hospital Admission (non-emergent condition) 0 []  - New Admissions / Biomedical engineer / Ordering NPWT, Apligraf, etc. 0 []  - Emergency Hospital Admission (emergent condition) 0 X - Simple Discharge Coordination 1 10 []  - Complex (extensive) Discharge Coordination 0 PROCESS - Special Needs []  - Pediatric / Minor Patient Management 0 []  - Isolation Patient Management 0 []  - Hearing / Language / Visual special needs 0 []  - Assessment of Community assistance (transportation, D/C planning, etc.) 0 []  - Additional assistance / Altered mentation 0 []  - Support  Surface(s) Assessment (bed, cushion, seat, etc.) 0 INTERVENTIONS - Wound Cleansing / Measurement X - Simple Wound Cleansing - one wound 1 5 []  - Complex Wound Cleansing - multiple wounds 0 X -  Wound Imaging (photographs - any number of wounds) 1 5 []  - Wound Tracing (instead of photographs) 0 X - Simple Wound Measurement - one wound 1 5 []  - Complex Wound Measurement - multiple wounds 0 INTERVENTIONS - Wound Dressings []  - Small Wound Dressing one or multiple wounds 0 []  - Medium Wound Dressing one or multiple wounds 0 X - Large Wound Dressing one or multiple wounds 1 20 X - Application of Medications - topical 1 5 []  - Application of Medications - injection 0 INTERVENTIONS - Miscellaneous []  - External ear exam 0 [name], [name] []  - Specimen Collection (cultures, biopsies, blood, body fluids, etc.) 0 []  - Specimen(s) / Culture(s) sent or taken to Lab for analysis 0 []  - Patient Transfer (multiple staff / Harrel Lemon Lift / Similar devices) 0 []  - Simple Staple / Suture removal (25 or less) 0 []  - Complex Staple / Suture removal (26 or more) 0 []  - Hypo / Hyperglycemic Management (close monitor of Blood Glucose) 0 []  - Ankle / Brachial Index (ABI) - do not check if billed separately 0 X - Vital Signs 1 5 Has the patient been seen at the hospital within the last three years: Yes Total Score: 80 Level Of Care: New/Established - Level 3 Electronic Signature(s) Signed: 05/29/2015 5:53:24 PM By: Alric Quan Entered By: Alric Quan on 05/29/2015 16:16:35 [name], [name] -------------------------------------------------------------------------------- Encounter Discharge Information Details Patient Name: [name], [name] Date of Service: 05/29/2015 2:45 PM Medical Record Number: [id number] Patient Account Number: [id number] Date of Birth/Sex: 10/14/1927 (82 y.o. Female) Treating RN: Carolyne Fiscal, Debi Primary Care Physician: Ria Bush Other  Clinician: Referring Physician: Ria Bush Treating Physician/Extender: Frann Rider in Treatment: 42 Encounter Discharge Information Items Discharge Pain Level: 0 Discharge Condition: Stable Ambulatory Status: [name] Discharge Destination: Home Transportation: Private Auto Accompanied By: son Schedule Follow-up Appointment: Yes Medication Reconciliation completed Yes and provided to Patient/Care Griff Badley: Provided on Clinical Summary of Care: 05/29/2015 Form Type Recipient Paper Patient LL Electronic Signature(s) Signed: 05/29/2015 3:35:55 PM By: Ruthine Dose Entered By: Ruthine Dose on 05/29/2015 15:35:55 Ehrhard, [name] -------------------------------------------------------------------------------- Lower Extremity Assessment Details Patient Name: [name], [name] Date of Service: 05/29/2015 2:45 PM Medical Record Number: [id number] Patient Account Number: [id number] Date of Birth/Sex: 1927-06-19 (82 y.o. Female) Treating RN: Carolyne Fiscal, Debi Primary Care Physician: Ria Bush Other Clinician: Referring Physician: Ria Bush Treating Physician/Extender: Frann Rider in Treatment: 41 Edema Assessment Assessed: [Left: No] [Right: No] E[Left: dema] [Right: :] Calf Left: Right: Point of Measurement: cm From Medial Instep cm 26 cm Ankle Left: Right: Point of Measurement: cm From Medial Instep cm 19 cm Vascular Assessment Pulses: Posterior Tibial Dorsalis Pedis Palpable: [Right:Yes] Extremity colors, hair growth, and conditions: Extremity Color: [Right:Mottled] Temperature of Extremity: [Right:Warm] Capillary Refill: [Right:< 3 seconds] Toe Nail Assessment Left: Right: Thick: Yes Discolored: Yes Deformed: No Improper Length and Hygiene: No Electronic Signature(s) Signed: 05/29/2015 5:53:24 PM By: Alric Quan Entered By: Alric Quan on 05/29/2015 14:55:00 [name], [name]  ([id number]) -------------------------------------------------------------------------------- Multi Wound Chart Details Patient Name: [name], [name] Date of Service: 05/29/2015 2:45 PM Medical Record Number: [id number] Patient Account Number: [id number] Date of Birth/Sex: [birth year] (82 y.o. Female) Treating RN: Carolyne Fiscal, Debi Primary Care Physician: Ria Bush Other Clinician: Referring Physician: Ria Bush Treating Physician/Extender: Frann Rider in Treatment: 41 Vital Signs Height(in): 58 Pulse(bpm): 73 Weight(lbs): 151 Blood Pressure 114/54 (mmHg): Body Mass Index(BMI): 32 Temperature(F): 97.7 Respiratory Rate 20 (breaths/min): Photos: [1:No Photos] [3:No Photos] [N/A:N/A] Wound  Location: [1:Right Lower Leg - Medial Right Lower Leg - Medial,] [3:Proximal] [N/A:N/A] Wounding Event: [1:Gradually Appeared] [3:Gradually Appeared] [N/A:N/A] Primary Etiology: [1:Venous Leg Ulcer] [3:Venous Leg Ulcer] [N/A:N/A] Comorbid History: [1:Cataracts, Anemia, Coronary Artery Disease, Coronary Artery Disease, Hypertension, Myocardial Hypertension, Myocardial Infarction, Type II Diabetes, Osteoarthritis Diabetes, Osteoarthritis] [3:Cataracts, Anemia, Infarction, Type II]  [N/A:N/A] Date Acquired: [1:04/04/2014] [3:05/15/2015] [N/A:N/A] Weeks of Treatment: [1:41] [3:2] [N/A:N/A] Wound Status: [1:Open] [3:Open] [N/A:N/A] Measurements L x W x D 1.6x0.5x0.2 [3:1.5x0.1x0.1] [N/A:N/A] (cm) Area (cm) : [1:0.628] [3:0.118] [N/A:N/A] Volume (cm) : [1:0.126] [3:0.012] [N/A:N/A] % Reduction in Area: [1:89.30%] [3:76.50%] [N/A:N/A] % Reduction in Volume: 92.90% [3:76.00%] [N/A:N/A] Classification: [1:Full Thickness Without Exposed Support Structures] [3:Partial Thickness] [N/A:N/A] HBO Classification: [1:Grade 1] [3:Grade 1] [N/A:N/A] Exudate Amount: [1:Medium] [3:Medium] [N/A:N/A] Exudate Type: [1:Serosanguineous] [3:Serous] [N/A:N/A] Exudate Color: [1:red, brown]  [3:amber] [N/A:N/A] Wound Margin: [1:Distinct, outline attached Flat and Intact] [N/A:N/A] Granulation Amount: [1:Large (67-100%)] [3:Large (67-100%)] [N/A:N/A] Granulation Quality: [1:Red, Pink] [3:Red] [N/A:N/A] Necrotic Amount: [1:Small (1-33%)] [3:Small (1-33%)] [N/A:N/A] Exposed Structures: Fascia: No Fascia: No N/A Fat: No Fat: No Tendon: No Tendon: No Muscle: No Muscle: No Joint: No Joint: No Bone: No Bone: No Limited to Skin Limited to Skin Breakdown Breakdown Epithelialization: Small (1-33%) Small (1-33%) N/A Periwound Skin Texture: Edema: Yes Edema: No N/A Excoriation: No Excoriation: No Induration: No Induration: No Callus: No Callus: No Crepitus: No Crepitus: No Fluctuance: No Fluctuance: No Friable: No Friable: No Rash: No Rash: No Scarring: No Scarring: No Periwound Skin Moist: Yes Moist: Yes N/A Moisture: Maceration: No Maceration: No Dry/Scaly: No Dry/Scaly: No Periwound Skin Color: Erythema: Yes Atrophie Blanche: No N/A Mottled: Yes Cyanosis: No Atrophie Blanche: No Ecchymosis: No Cyanosis: No Erythema: No Ecchymosis: No Hemosiderin Staining: No Hemosiderin Staining: No Mottled: No Pallor: No Pallor: No Rubor: No Rubor: No Erythema Location: Circumferential N/A N/A Temperature: No Abnormality N/A N/A Tenderness on Yes Yes N/A Palpation: Wound Preparation: Ulcer Cleansing: Other: Ulcer Cleansing: Other: N/A soap and water soap and water Topical Anesthetic Topical Anesthetic Applied: Other: lidocaine Applied: Other: lidocaine 4% 4% Treatment Notes Electronic Signature(s) Signed: 05/29/2015 5:53:24 PM By: Alric Quan Entered By: Alric Quan on 05/29/2015 14:55:24 Jost, [name] ([id number]) -------------------------------------------------------------------------------- [place] Details Patient Name: [name], [name] Date of Service: 05/29/2015 2:45 PM Medical Record Number:  [id number] Patient Account Number: [id number] Date of Birth/Sex: 1927-04-18 (82 y.o. Female) Treating RN: Carolyne Fiscal, Debi Primary Care Physician: Ria Bush Other Clinician: Referring Physician: Ria Bush Treating Physician/Extender: Frann Rider in Treatment: 52 Active Inactive Abuse / Safety / Falls / Self Care Management Nursing Diagnoses: Potential for falls Goals: Patient will remain injury free Date Initiated: 08/15/2014 Goal Status: Active Interventions: Assess fall risk on admission and as needed Notes: Nutrition Nursing Diagnoses: Impaired glucose control: actual or potential Goals: Patient/caregiver verbalizes understanding of need to maintain therapeutic glucose control per primary care physician Date Initiated: 08/15/2014 Goal Status: Active Interventions: Assess patient nutrition upon admission and as needed per policy Notes: Orientation to the Wound Care Program Nursing Diagnoses: Knowledge deficit related to the wound healing center program Goals: Patient/caregiver will verbalize understanding of the [place] KEMONIE, DUCA ([id number]) Date Initiated: 08/15/2014 Goal Status: Active Interventions: Provide education on orientation to the wound center Notes: Venous Leg Ulcer Nursing Diagnoses: Actual venous Insuffiency (use after diagnosis is confirmed) Goals: Patient will maintain optimal edema control Date Initiated: 08/15/2014 Goal Status: Active Interventions: Compression as ordered Treatment Activities: Test ordered outside of clinic : 05/29/2015 Notes: Wound/Skin Impairment Nursing  Diagnoses: Impaired tissue integrity Goals: Patient/caregiver will verbalize understanding of skin care regimen Date Initiated: 08/15/2014 Goal Status: Active Interventions: Assess patient/caregiver ability to obtain necessary supplies Notes: Electronic Signature(s) Signed: 05/29/2015 5:53:24 PM By: Alric Quan Entered By: Alric Quan on 05/29/2015 14:55:11 Risby, Otto C. ([id number]) -------------------------------------------------------------------------------- Pain Assessment Details Patient Name: Burgner, Tysha C. Date of Service: 05/29/2015 2:45 PM Medical Record Number: [id number] Patient Account Number: [id number] Date of Birth/Sex: [birth year] (82 y.o. Female) Treating RN: Carolyne Fiscal, Debi Primary Care Physician: Ria Bush Other Clinician: Referring Physician: Ria Bush Treating Physician/Extender: Frann Rider in Treatment: 69 Active Problems Location of Pain Severity and Description of Pain Patient Has [name] No Site Locations Pain Management and Medication Current Pain Management: Electronic Signature(s) Signed: 05/29/2015 5:53:24 PM By: Alric Quan Entered By: Alric Quan on 05/29/2015 14:38:33 Bi, [name] -------------------------------------------------------------------------------- Patient/Caregiver Education Details Patient Name: [name], [name] Date of Service: 05/29/2015 2:45 PM Medical Record Number: [id number] Patient Account Number: [id number] Date of Birth/Gender: 23-Jun-1927 (82 y.o. Female) Treating RN: Carolyne Fiscal, Debi Primary Care Physician: Ria Bush Other Clinician: Referring Physician: Ria Bush Treating Physician/Extender: Frann Rider in Treatment: 59 Education Assessment Education Provided To: Patient Education Topics Provided Wound/Skin Impairment: Handouts: Other: do not get wrap wet Methods: Demonstration, Explain/Verbal Responses: State content correctly Electronic Signature(s) Signed: 05/29/2015 5:53:24 PM By: Alric Quan Entered By: Alric Quan on 05/29/2015 15:17:15 [name], [name] -------------------------------------------------------------------------------- Wound Assessment Details Patient Name: [name], [name] Date of  Service: 05/29/2015 2:45 PM Medical Record Number: [id number] Patient Account Number: [id number] Date of Birth/Sex: Aug 13, 1927 (82 y.o. Female) Treating RN: Carolyne Fiscal, Debi Primary Care Physician: Ria Bush Other Clinician: Referring Physician: Ria Bush Treating Physician/Extender: Frann Rider in Treatment: 41 Wound Status Wound Number: 1 Primary Venous Leg Ulcer Etiology: Wound Location: Right Lower Leg - Medial Wound Open Wounding Event: Gradually Appeared Status: Date Acquired: 04/04/2014 Comorbid Cataracts, Anemia, Coronary Artery Weeks Of Treatment: 41 History: Disease, Hypertension, Myocardial Clustered Wound: No Infarction, Type II Diabetes, Osteoarthritis Photos Photo Uploaded By: Alric Quan on 05/29/2015 17:46:58 Wound Measurements Length: (cm) 1.6 Width: (cm) 0.5 Depth: (cm) 0.2 Area: (cm) 0.628 Volume: (cm) 0.126 % Reduction in Area: 89.3% % Reduction in Volume: 92.9% Epithelialization: Small (1-33%) Tunneling: No Undermining: No Wound Description Full Thickness Without Exposed Foul Odor A Classification: Support Structures Diabetic Severity Grade 1 (Wagner): Wound Margin: Distinct, outline attached Exudate Amount: Medium Exudate Type: Serosanguineous Exudate Color: red, brown fter Cleansing: No Wound Bed Jacquin, Darion C. ([id number]) Granulation Amount: Large (67-100%) Exposed Structure Granulation Quality: Red, Pink Fascia Exposed: No Necrotic Amount: Small (1-33%) Fat Layer Exposed: No Necrotic Quality: Adherent Slough Tendon Exposed: No Muscle Exposed: No Joint Exposed: No Bone Exposed: No Limited to Skin Breakdown Periwound Skin Texture Texture Color No Abnormalities Noted: No No Abnormalities Noted: No Callus: No Atrophie Blanche: No Crepitus: No Cyanosis: No Excoriation: No Ecchymosis: No Fluctuance: No Erythema: Yes Friable: No Erythema Location: Circumferential Induration: No Hemosiderin  Staining: No Localized Edema: Yes Mottled: Yes Rash: No Pallor: No Scarring: No Rubor: No Moisture Temperature / Pain No Abnormalities Noted: No Temperature: No Abnormality Dry / Scaly: No Tenderness on Palpation: Yes Maceration: No Moist: Yes Wound Preparation Ulcer Cleansing: Other: soap and water, Topical Anesthetic Applied: Other: lidocaine 4%, Treatment Notes Wound #1 (Right, Medial Lower Leg) 1. Cleansed with: Cleanse wound with antibacterial soap and water 2. Anesthetic Topical Lidocaine 4% cream to wound bed prior to debridement 3. Peri-wound Care: Skin Prep 4. Dressing Applied: Mepitel  5. Secondary Dressing Applied Dry Gauze 6. Footwear/Offloading device applied Felt/Foam 7. Secured with [name], [place] ([id number]) Tape 2 Layer Lite Compression System - Right Lower Extremity Electronic Signature(s) Signed: 05/29/2015 5:53:24 PM By: Alric Quan Entered By: Alric Quan on 05/29/2015 14:53:09 Omlor, Cayenne C. ([id number]) -------------------------------------------------------------------------------- Wound Assessment Details Patient Name: Missildine, Elvin C. Date of Service: 05/29/2015 2:45 PM Medical Record Number: [id number] Patient Account Number: [id number] Date of Birth/Sex: 1927/10/30 (82 y.o. Female) Treating RN: Carolyne Fiscal, Debi Primary Care Physician: Ria Bush Other Clinician: Referring Physician: Ria Bush Treating Physician/Extender: Frann Rider in Treatment: 41 Wound Status Wound Number: 3 Primary Venous Leg Ulcer Etiology: Wound Location: Right Lower Leg - Medial, Proximal Wound Open Status: Wounding Event: Gradually Appeared Comorbid Cataracts, Anemia, Coronary Artery Date Acquired: 05/15/2015 History: Disease, Hypertension, Myocardial Weeks Of Treatment: 2 Infarction, Type II Diabetes, Clustered Wound: No Osteoarthritis Photos Photo Uploaded By: Alric Quan on 05/29/2015 17:46:58 Wound  Measurements Length: (cm) 0 % Reduction Width: (cm) 0 % Reduction Depth: (cm) 0 Epitheliali Area: (cm) 0 Tunneling: Volume: (cm) 0 Underminin in Area: 100% in Volume: 100% zation: Large (67-100%) No g: No Wound Description Classification: Partial Thickness Foul Odor A Diabetic Severity (Wagner): Grade 1 Wound Margin: Flat and Intact Exudate Amount: Medium Exudate Type: Serous Exudate Color: amber fter Cleansing: No Wound Bed Granulation Amount: None Present (0%) Exposed Structure Necrotic Amount: None Present (0%) Fascia Exposed: No Doescher, Kallee C. ([id number]) Fat Layer Exposed: No Tendon Exposed: No Muscle Exposed: No Joint Exposed: No Bone Exposed: No Limited to Skin Breakdown Periwound Skin Texture Texture Color No Abnormalities Noted: No No Abnormalities Noted: No Callus: No Atrophie Blanche: No Crepitus: No Cyanosis: No Excoriation: No Ecchymosis: No Fluctuance: No Erythema: No Friable: No Hemosiderin Staining: No Induration: No Mottled: No Localized Edema: No Pallor: No Rash: No Rubor: No Scarring: No Moisture No Abnormalities Noted: No Dry / Scaly: No Maceration: No Moist: No Wound Preparation Ulcer Cleansing: Other: soap and water, Topical Anesthetic Applied: None Electronic Signature(s) Signed: 05/29/2015 5:53:24 PM By: Alric Quan Entered By: Alric Quan on 05/29/2015 15:14:23 Rasnick, [name] -------------------------------------------------------------------------------- Vitals Details Patient Name: Batz, Zyra C. Date of Service: 05/29/2015 2:45 PM Medical Record Number: [id number] Patient Account Number: [id number] Date of Birth/Sex: 1927/07/24 (82 y.o. Female) Treating RN: Carolyne Fiscal, Debi Primary Care Physician: Ria Bush Other Clinician: Referring Physician: Ria Bush Treating Physician/Extender: Frann Rider in Treatment: 41 Vital Signs Time Taken: 14:38 Temperature  (F): 97.7 Height (in): 58 Pulse (bpm): 73 Weight (lbs): 151 Respiratory Rate (breaths/min): 20 Body Mass Index (BMI): 31.6 Blood Pressure (mmHg): 114/54 Reference Range: 80 - 120 mg / dl Electronic Signature(s) Signed: 05/29/2015 5:53:24 PM By: Alric Quan Entered By: Alric Quan on 05/29/2015 14:42:14

## 2015-06-05 ENCOUNTER — Encounter: Payer: Medicare Other | Attending: Surgery | Admitting: Surgery

## 2015-06-05 DIAGNOSIS — L97811 Non-pressure chronic ulcer of other part of right lower leg limited to breakdown of skin: Secondary | ICD-10-CM | POA: Diagnosis not present

## 2015-06-05 DIAGNOSIS — S41112A Laceration without foreign body of left upper arm, initial encounter: Secondary | ICD-10-CM | POA: Insufficient documentation

## 2015-06-05 DIAGNOSIS — I87331 Chronic venous hypertension (idiopathic) with ulcer and inflammation of right lower extremity: Secondary | ICD-10-CM | POA: Diagnosis not present

## 2015-06-05 DIAGNOSIS — I10 Essential (primary) hypertension: Secondary | ICD-10-CM | POA: Diagnosis not present

## 2015-06-05 DIAGNOSIS — Z951 Presence of aortocoronary bypass graft: Secondary | ICD-10-CM | POA: Diagnosis not present

## 2015-06-05 DIAGNOSIS — E11610 Type 2 diabetes mellitus with diabetic neuropathic arthropathy: Secondary | ICD-10-CM | POA: Insufficient documentation

## 2015-06-05 DIAGNOSIS — X58XXXA Exposure to other specified factors, initial encounter: Secondary | ICD-10-CM | POA: Insufficient documentation

## 2015-06-05 DIAGNOSIS — E11622 Type 2 diabetes mellitus with other skin ulcer: Secondary | ICD-10-CM | POA: Diagnosis not present

## 2015-06-05 DIAGNOSIS — I87311 Chronic venous hypertension (idiopathic) with ulcer of right lower extremity: Secondary | ICD-10-CM | POA: Diagnosis not present

## 2015-06-08 NOTE — Progress Notes (Signed)
[name], [name] ([id number]) Visit Report for 05/22/2015 Arrival Information Details Patient Name: [name], [name] Date of Service: 05/22/2015 2:45 PM Medical Record Number: [id number] Patient Account Number: [id number] Date of Birth/Sex: Apr 12, 1927 (82 y.o. Female) Treating RN: Carolyne Fiscal, Debi Primary Care Physician: Ria Bush Other Clinician: Referring Physician: Ria Bush Treating Physician/Extender: Benjaman Pott in Treatment: 21 Visit Information History Since Last Visit All ordered tests and consults were completed: No Patient Arrived: Walker Added or deleted any medications: No Arrival Time: 15:13 Any new allergies or adverse reactions: No Accompanied By: self Had a fall or experienced change in No Transfer Assistance: None activities of daily living that may affect Patient Identification Verified: Yes risk of falls: Secondary Verification Process Yes Signs or symptoms of abuse/neglect since last No Completed: visito Patient Requires Transmission- No Hospitalized since last visit: No Based Precautions: Pain Present Now: No Patient Has Alerts: Yes Patient Alerts: Patient on Blood Thinner Type II Diabetic 81 MG aspirin Electronic Signature(s) Signed: 05/22/2015 4:59:51 PM By: Alric Quan Entered By: Alric Quan on 05/22/2015 16:24:30 [name] -------------------------------------------------------------------------------- Clinic Level of Care Assessment Details Patient Name: [name], [name] Date of Service: 05/22/2015 2:45 PM Medical Record Number: [id number] Patient Account Number: [id number] Date of Birth/Sex: 1927/09/07 (82 y.o. Female) Treating RN: Carolyne Fiscal, Debi Primary Care Physician: Ria Bush Other Clinician: Referring Physician: Ria Bush Treating Physician/Extender: Benjaman Pott in Treatment: 40 Clinic Level of Care Assessment Items TOOL 4 Quantity Score X - Use when only  an EandM is performed on FOLLOW-UP visit 1 0 ASSESSMENTS - Nursing Assessment / Reassessment []  - Reassessment of Co-morbidities (includes updates in patient status) 0 X - Reassessment of Adherence to Treatment Plan 1 5 ASSESSMENTS - Wound and Skin Assessment / Reassessment []  - Simple Wound Assessment / Reassessment - one wound 0 X - Complex Wound Assessment / Reassessment - multiple wounds 2 5 []  - Dermatologic / Skin Assessment (not related to wound area) 0 ASSESSMENTS - Focused Assessment []  - Circumferential Edema Measurements - multi extremities 0 []  - Nutritional Assessment / Counseling / Intervention 0 X - Lower Extremity Assessment (monofilament, tuning fork, pulses) 1 5 []  - Peripheral Arterial Disease Assessment (using hand held doppler) 0 ASSESSMENTS - Ostomy and/or Continence Assessment and Care []  - Incontinence Assessment and Management 0 []  - Ostomy Care Assessment and Management (repouching, etc.) 0 PROCESS - Coordination of Care X - Simple Patient / Family Education for ongoing care 1 15 []  - Complex (extensive) Patient / Family Education for ongoing care 0 []  - Staff obtains Programmer, systems, Records, Test Results / Process Orders 0 []  - Staff telephones HHA, Nursing Homes / Clarify orders / etc 0 []  - Routine Transfer to another Facility (non-emergent condition) 0 [name], [name] ([id number]) []  - Routine Hospital Admission (non-emergent condition) 0 []  - New Admissions / Biomedical engineer / Ordering NPWT, Apligraf, etc. 0 []  - Emergency Hospital Admission (emergent condition) 0 X - Simple Discharge Coordination 1 10 []  - Complex (extensive) Discharge Coordination 0 PROCESS - Special Needs []  - Pediatric / Minor Patient Management 0 []  - Isolation Patient Management 0 []  - Hearing / Language / Visual special needs 0 []  - Assessment of Community assistance (transportation, D/C planning, etc.) 0 []  - Additional assistance / Altered mentation 0 []  - Support  Surface(s) Assessment (bed, cushion, seat, etc.) 0 INTERVENTIONS - Wound Cleansing / Measurement []  - Simple Wound Cleansing - one wound 0 X - Complex Wound Cleansing - multiple wounds 2 5  X - Wound Imaging (photographs - any number of wounds) 1 5 []  - Wound Tracing (instead of photographs) 0 []  - Simple Wound Measurement - one wound 0 X - Complex Wound Measurement - multiple wounds 2 5 INTERVENTIONS - Wound Dressings []  - Small Wound Dressing one or multiple wounds 0 []  - Medium Wound Dressing one or multiple wounds 0 X - Large Wound Dressing one or multiple wounds 2 20 X - Application of Medications - topical 1 5 []  - Application of Medications - injection 0 INTERVENTIONS - Miscellaneous []  - External ear exam 0 [name], [name] ([id number]) []  - Specimen Collection (cultures, biopsies, blood, body fluids, etc.) 0 []  - Specimen(s) / Culture(s) sent or taken to Lab for analysis 0 []  - Patient Transfer (multiple staff / Harrel Lemon Lift / Similar devices) 0 []  - Simple Staple / Suture removal (25 or less) 0 []  - Complex Staple / Suture removal (26 or more) 0 []  - Hypo / Hyperglycemic Management (close monitor of Blood Glucose) 0 []  - Ankle / Brachial Index (ABI) - do not check if billed separately 0 X - Vital Signs 1 5 Has the patient been seen at the hospital within the last three years: Yes Total Score: 120 Level Of Care: New/Established - Level 4 Electronic Signature(s) Signed: 05/22/2015 4:59:51 PM By: Alric Quan Entered By: Alric Quan on 05/22/2015 16:28:06 [name] ([id number]) -------------------------------------------------------------------------------- Encounter Discharge Information Details Patient Name: [name], [name] Date of Service: 05/22/2015 2:45 PM Medical Record Number: [id number] Patient Account Number: [id number] Date of Birth/Sex: [birth year] (82 y.o. Female) Treating RN: Carolyne Fiscal, Debi Primary Care Physician: Ria Bush Other  Clinician: Referring Physician: Ria Bush Treating Physician/Extender: Frann Rider in Treatment: 50 Encounter Discharge Information Items Discharge Pain Level: 0 Discharge Condition: Stable Ambulatory Status: Walker Discharge Destination: Home Transportation: Private Auto Accompanied By: son Schedule Follow-up Appointment: Yes Medication Reconciliation completed Yes and provided to Patient/Care Janya Eveland: Provided on Clinical Summary of Care: 05/22/2015 Form Type Recipient Paper Patient LL Electronic Signature(s) Signed: 06/08/2015 10:13:55 AM By: Judene Companion MD Previous Signature: 05/22/2015 3:58:17 PM Version By: Ruthine Dose Entered By: Judene Companion on 05/22/2015 16:16:38 Montas, [name] ([id number]) -------------------------------------------------------------------------------- Lower Extremity Assessment Details Patient Name: Lora, Tanai C. Date of Service: 05/22/2015 2:45 PM Medical Record Number: [id number] Patient Account Number: [id number] Date of Birth/Sex: 1927/09/22 (82 y.o. Female) Treating RN: Carolyne Fiscal, Debi Primary Care Physician: Ria Bush Other Clinician: Referring Physician: Ria Bush Treating Physician/Extender: Benjaman Pott in Treatment: 40 Edema Assessment Assessed: [Left: No] [Right: No] E[Left: dema] [Right: :] Calf Left: Right: Point of Measurement: 27 cm From Medial Instep cm 26 cm Ankle Left: Right: Point of Measurement: 10 cm From Medial Instep cm 19.5 cm Vascular Assessment Pulses: Posterior Tibial Dorsalis Pedis Palpable: [Right:Yes] Extremity colors, hair growth, and conditions: Extremity Color: [Right:Red] Temperature of Extremity: [Right:Warm] Capillary Refill: [Right:< 3 seconds] Toe Nail Assessment Left: Right: Thick: No Discolored: Yes Deformed: No Improper Length and Hygiene: No Electronic Signature(s) Signed: 05/22/2015 4:59:51 PM By: Alric Quan Entered By: Alric Quan on 05/22/2015 16:24:55 [name], [name] ([id number]) -------------------------------------------------------------------------------- Multi Wound Chart Details Patient Name: [name], [name] Date of Service: 05/22/2015 2:45 PM Medical Record Number: [id number] Patient Account Number: [id number] Date of Birth/Sex: [birth year] (82 y.o. Female) Treating RN: Carolyne Fiscal, Debi Primary Care Physician: Ria Bush Other Clinician: Referring Physician: Ria Bush Treating Physician/Extender: Benjaman Pott in Treatment: 40 Vital Signs Height(in): 58 Pulse(bpm): 68 Weight(lbs): 151 Blood Pressure 119/55 (mmHg): Body Mass Index(BMI):  32 Temperature(F): 98.1 Respiratory Rate 20 (breaths/min): Photos: [1:No Photos] [3:No Photos] [N/A:N/A] Wound Location: [1:Right Lower Leg - Medial Right Lower Leg - Medial,] [3:Proximal] [N/A:N/A] Wounding Event: [1:Gradually Appeared] [3:Gradually Appeared] [N/A:N/A] Primary Etiology: [1:Venous Leg Ulcer] [3:Venous Leg Ulcer] [N/A:N/A] Comorbid History: [1:Cataracts, Anemia, Coronary Artery Disease, Coronary Artery Disease, Hypertension, Myocardial Hypertension, Myocardial Infarction, Type II Diabetes, Osteoarthritis Diabetes, Osteoarthritis] [3:Cataracts, Anemia, Infarction, Type II]  [N/A:N/A] Date Acquired: [1:04/04/2014] [3:05/15/2015] [N/A:N/A] Weeks of Treatment: [1:40] [3:1] [N/A:N/A] Wound Status: [1:Open] [3:Open] [N/A:N/A] Measurements L x W x D 2.2x0.5x0.2 [3:2x0.8x0.1] [N/A:N/A] (cm) Area (cm) : [1:0.864] [3:1.257] [N/A:N/A] Volume (cm) : [1:0.173] [3:0.126] [N/A:N/A] % Reduction in Area: [1:85.30%] [3:-149.90%] [N/A:N/A] % Reduction in Volume: 90.20% [3:-152.00%] [N/A:N/A] Classification: [1:Full Thickness Without Exposed Support Structures] [3:Partial Thickness] [N/A:N/A] HBO Classification: [1:Grade 1] [3:Grade 1] [N/A:N/A] Exudate Amount: [1:Medium] [3:Medium] [N/A:N/A] Exudate Type: [1:Serosanguineous]  [3:Serous] [N/A:N/A] Exudate Color: [1:red, brown] [3:amber] [N/A:N/A] Wound Margin: [1:Distinct, outline attached Flat and Intact] [N/A:N/A] Granulation Amount: [1:Large (67-100%)] [3:Large (67-100%)] [N/A:N/A] Granulation Quality: [1:Red, Pink] [3:Red] [N/A:N/A] Necrotic Amount: [1:Small (1-33%)] [3:None Present (0%)] [N/A:N/A] Exposed Structures: Fascia: No Fascia: No N/A Fat: No Fat: No Tendon: No Tendon: No Muscle: No Muscle: No Joint: No Joint: No Bone: No Bone: No Limited to Skin Limited to Skin Breakdown Breakdown Epithelialization: Small (1-33%) None N/A Periwound Skin Texture: Edema: Yes Edema: No N/A Excoriation: No Excoriation: No Induration: No Induration: No Callus: No Callus: No Crepitus: No Crepitus: No Fluctuance: No Fluctuance: No Friable: No Friable: No Rash: No Rash: No Scarring: No Scarring: No Periwound Skin Moist: Yes Moist: Yes N/A Moisture: Maceration: No Maceration: No Dry/Scaly: No Dry/Scaly: No Periwound Skin Color: Erythema: Yes Atrophie Blanche: No N/A Mottled: Yes Cyanosis: No Atrophie Blanche: No Ecchymosis: No Cyanosis: No Erythema: No Ecchymosis: No Hemosiderin Staining: No Hemosiderin Staining: No Mottled: No Pallor: No Pallor: No Rubor: No Rubor: No Erythema Location: Circumferential N/A N/A Temperature: No Abnormality N/A N/A Tenderness on Yes Yes N/A Palpation: Wound Preparation: Ulcer Cleansing: Other: Ulcer Cleansing: Other: N/A soap and water soap and water Topical Anesthetic Topical Anesthetic Applied: Other: lidocaine Applied: Other: lidocaine 4% 4% Treatment Notes Wound #1 (Right, Medial Lower Leg) 1. Cleansed with: Cleanse wound with antibacterial soap and water 2. Anesthetic Topical Lidocaine 4% cream to wound bed prior to debridement 3. Peri-wound Care: Skin Prep 4. Dressing Applied: Booze, Lulani C. ([id number]) Mepitel Other dressing (specify in notes) 7. Secured with Tape 2 Layer  Compression System - Right Lower Extremity Notes steri-strips, collagen dressing Wound #3 (Right, Proximal, Medial Lower Leg) 1. Cleansed with: Cleanse wound with antibacterial soap and water 2. Anesthetic Topical Lidocaine 4% cream to wound bed prior to debridement 3. Peri-wound Care: Skin Prep 4. Dressing Applied: Mepitel Other dressing (specify in notes) 7. Secured with Tape 2 Layer Compression System - Right Lower Extremity Notes steri-strips, collagen dressing Electronic Signature(s) Signed: 05/22/2015 4:59:51 PM By: Alric Quan Entered By: Alric Quan on 05/22/2015 16:26:00 [name], [name] ([id number]) -------------------------------------------------------------------------------- [name] Details Patient Name: Mallet, [name] Date of Service: 05/22/2015 2:45 PM Medical Record Number: [id number] Patient Account Number: [id number] Date of Birth/Sex: October 04, 1927 (82 y.o. Female) Treating RN: Carolyne Fiscal, Debi Primary Care Physician: Ria Bush Other Clinician: Referring Physician: Ria Bush Treating Physician/Extender: Benjaman Pott in Treatment: 58 Active Inactive Abuse / Safety / Falls / Self Care Management Nursing Diagnoses: Potential for falls Goals: Patient will remain injury free Date Initiated: 08/15/2014 Goal Status: Active Interventions: Assess fall risk on admission and as  needed Notes: Nutrition Nursing Diagnoses: Impaired glucose control: actual or potential Goals: Patient/caregiver verbalizes understanding of need to maintain therapeutic glucose control per primary care physician Date Initiated: 08/15/2014 Goal Status: Active Interventions: Assess patient nutrition upon admission and as needed per policy Notes: Orientation to the Wound Care Program Nursing Diagnoses: Knowledge deficit related to the wound healing center program Goals: Patient/caregiver will verbalize understanding of the [place], Bertina C. ([id number]) Date Initiated: 08/15/2014 Goal Status: Active Interventions: Provide education on orientation to the wound center Notes: Venous Leg Ulcer Nursing Diagnoses: Actual venous Insuffiency (use after diagnosis is confirmed) Goals: Patient will maintain optimal edema control Date Initiated: 08/15/2014 Goal Status: Active Interventions: Compression as ordered Treatment Activities: Test ordered outside of clinic : 05/22/2015 Notes: Wound/Skin Impairment Nursing Diagnoses: Impaired tissue integrity Goals: Patient/caregiver will verbalize understanding of skin care regimen Date Initiated: 08/15/2014 Goal Status: Active Interventions: Assess patient/caregiver ability to obtain necessary supplies Notes: Electronic Signature(s) Signed: 05/22/2015 4:59:51 PM By: Alric Quan Entered By: Alric Quan on 05/22/2015 16:25:48 Forton, Kemia C. ([id number]) -------------------------------------------------------------------------------- Pain Assessment Details Patient Name: [name], [name] Date of Service: 05/22/2015 2:45 PM Medical Record Number: [id number] Patient Account Number: [id number] Date of Birth/Sex: Aug 21, 1927 (82 y.o. Female) Treating RN: Carolyne Fiscal, Debi Primary Care Physician: Ria Bush Other Clinician: Referring Physician: Ria Bush Treating Physician/Extender: Benjaman Pott in Treatment: 40 Active Problems Location of Pain Severity and Description of Pain Patient Has [name] No Site Locations Pain Management and Medication Current Pain Management: Electronic Signature(s) Signed: 05/22/2015 4:59:51 PM By: Alric Quan Entered By: Alric Quan on 05/22/2015 16:24:37 [name], [name] ([id number]) -------------------------------------------------------------------------------- Patient/Caregiver Education Details Patient Name: Sabino, Samiksha C. Date of Service: 05/22/2015 2:45  PM Medical Record Number: [id number] Patient Account Number: [id number] Date of Birth/Gender: 09/23/1927 (82 y.o. Female) Treating RN: Carolyne Fiscal, Debi Primary Care Physician: Ria Bush Other Clinician: Referring Physician: Ria Bush Treating Physician/Extender: Frann Rider in Treatment: 6 Education Assessment Education Provided To: Patient Education Topics Provided Wound/Skin Impairment: Handouts: Other: do not get wrap wet Methods: Demonstration, Explain/Verbal Responses: State content correctly Electronic Signature(s) Signed: 06/08/2015 10:13:55 AM By: Judene Companion MD Entered By: Judene Companion on 05/22/2015 16:16:46 [name], [name] ([id number]) -------------------------------------------------------------------------------- Wound Assessment Details Patient Name: Petko, Mellany C. Date of Service: 05/22/2015 2:45 PM Medical Record Number: [id number] Patient Account Number: [id number] Date of Birth/Sex: 10/13/1927 (82 y.o. Female) Treating RN: Carolyne Fiscal, Debi Primary Care Physician: Ria Bush Other Clinician: Referring Physician: Ria Bush Treating Physician/Extender: Benjaman Pott in Treatment: 40 Wound Status Wound Number: 1 Primary Venous Leg Ulcer Etiology: Wound Location: Right Lower Leg - Medial Wound Open Wounding Event: Gradually Appeared Status: Date Acquired: 04/04/2014 Comorbid Cataracts, Anemia, Coronary Artery Weeks Of Treatment: 40 History: Disease, Hypertension, Myocardial Clustered Wound: No Infarction, Type II Diabetes, Osteoarthritis Photos Photo Uploaded By: Alric Quan on 05/22/2015 16:32:19 Wound Measurements Length: (cm) 2.2 Width: (cm) 0.5 Depth: (cm) 0.2 Area: (cm) 0.864 Volume: (cm) 0.173 % Reduction in Area: 85.3% % Reduction in Volume: 90.2% Epithelialization: Small (1-33%) Tunneling: No Undermining: No Wound Description Full Thickness Without Exposed Foul Odor  A Classification: Support Structures Diabetic Severity Grade 1 (Wagner): Wound Margin: Distinct, outline attached Exudate Amount: Medium Exudate Type: Serosanguineous Exudate Color: red, brown fter Cleansing: No Wound Bed Earwood, Ariona C. ([id number]) Granulation Amount: Large (67-100%) Exposed Structure Granulation Quality: Red, Pink Fascia Exposed: No Necrotic Amount: Small (1-33%) Fat Layer Exposed: No Necrotic Quality: Adherent Slough Tendon Exposed: No Muscle Exposed: No Joint Exposed: No  Bone Exposed: No Limited to Skin Breakdown Periwound Skin Texture Texture Color No Abnormalities Noted: No No Abnormalities Noted: No Callus: No Atrophie Blanche: No Crepitus: No Cyanosis: No Excoriation: No Ecchymosis: No Fluctuance: No Erythema: Yes Friable: No Erythema Location: Circumferential Induration: No Hemosiderin Staining: No Localized Edema: Yes Mottled: Yes Rash: No Pallor: No Scarring: No Rubor: No Moisture Temperature / Pain No Abnormalities Noted: No Temperature: No Abnormality Dry / Scaly: No Tenderness on Palpation: Yes Maceration: No Moist: Yes Wound Preparation Ulcer Cleansing: Other: soap and water, Topical Anesthetic Applied: Other: lidocaine 4%, Electronic Signature(s) Signed: 05/22/2015 4:59:51 PM By: Alric Quan Entered By: Alric Quan on 05/22/2015 16:25:25 Fell, [name] ([id number]) -------------------------------------------------------------------------------- Wound Assessment Details Patient Name: [name], [name] Date of Service: 05/22/2015 2:45 PM Medical Record Number: [id number] Patient Account Number: [id number] Date of Birth/Sex: 05-May-1927 (82 y.o. Female) Treating RN: Carolyne Fiscal, Debi Primary Care Physician: Ria Bush Other Clinician: Referring Physician: Ria Bush Treating Physician/Extender: Benjaman Pott in Treatment: 40 Wound Status Wound Number: 3 Primary Venous Leg  Ulcer Etiology: Wound Location: Right Lower Leg - Medial, Proximal Wound Open Status: Wounding Event: Gradually Appeared Comorbid Cataracts, Anemia, Coronary Artery Date Acquired: 05/15/2015 History: Disease, Hypertension, Myocardial Weeks Of Treatment: 1 Infarction, Type II Diabetes, Clustered Wound: No Osteoarthritis Photos Photo Uploaded By: Alric Quan on 05/22/2015 16:32:19 Wound Measurements Length: (cm) 2 Width: (cm) 0.8 Depth: (cm) 0.1 Area: (cm) 1.257 Volume: (cm) 0.126 % Reduction in Area: -149.9% % Reduction in Volume: -152% Epithelialization: None Tunneling: No Undermining: No Wound Description Classification: Partial Thickness Foul Odor A Diabetic Severity (Wagner): Grade 1 Wound Margin: Flat and Intact Exudate Amount: Medium Exudate Type: Serous Exudate Color: amber fter Cleansing: No Wound Bed Granulation Amount: Large (67-100%) Exposed Structure Granulation Quality: Red Fascia Exposed: No [name], [name] ([id number]) Necrotic Amount: None Present (0%) Fat Layer Exposed: No Tendon Exposed: No Muscle Exposed: No Joint Exposed: No Bone Exposed: No Limited to Skin Breakdown Periwound Skin Texture Texture Color No Abnormalities Noted: No No Abnormalities Noted: No Callus: No Atrophie Blanche: No Crepitus: No Cyanosis: No Excoriation: No Ecchymosis: No Fluctuance: No Erythema: No Friable: No Hemosiderin Staining: No Induration: No Mottled: No Localized Edema: No Pallor: No Rash: No Rubor: No Scarring: No Temperature / Pain Moisture Tenderness on Palpation: Yes No Abnormalities Noted: No Dry / Scaly: No Maceration: No Moist: Yes Wound Preparation Ulcer Cleansing: Other: soap and water, Topical Anesthetic Applied: Other: lidocaine 4%, Electronic Signature(s) Signed: 05/22/2015 4:59:51 PM By: Alric Quan Entered By: Alric Quan on 05/22/2015 16:25:36 Hyson, [name]  ([id number]) -------------------------------------------------------------------------------- Vitals Details Patient Name: [name], [name] Date of Service: 05/22/2015 2:45 PM Medical Record Number: [id number] Patient Account Number: [id number] Date of Birth/Sex: [birth year] (82 y.o. Female) Treating RN: Carolyne Fiscal, Debi Primary Care Physician: Ria Bush Other Clinician: Referring Physician: Ria Bush Treating Physician/Extender: Benjaman Pott in Treatment: 40 Vital Signs Time Taken: 15:13 Temperature (F): 98.1 Height (in): 58 Pulse (bpm): 68 Weight (lbs): 151 Respiratory Rate (breaths/min): 20 Body Mass Index (BMI): 31.6 Blood Pressure (mmHg): 119/55 Reference Range: 80 - 120 mg / dl Electronic Signature(s) Signed: 05/22/2015 4:59:51 PM By: Alric Quan Entered By: Alric Quan on 05/22/2015 16:24:44

## 2015-06-08 NOTE — Progress Notes (Signed)
[name], [name] ([id number]) Visit Report for 05/22/2015 Chief Complaint Document Details Patient Name: [name], [name] Date of Service: 05/22/2015 2:45 PM Medical Record Number: [id number] Patient Account Number: [id number] Date of Birth/Sex: [birth year] (82 y.o. Female) Treating RN: Carolyne Fiscal, Debi Primary Care Physician: Ria Bush Other Clinician: Referring Physician: Ria Bush Treating Physician/Extender: Benjaman Pott in Treatment: 4 Information Obtained from: Patient Chief Complaint Patient presents for treatment of an open ulcer due to venous insufficiency. 82 year old patient who is known to me from the [facility] wound care center now comes in follow up here for a chronic venous ulceration of her right lower extremity which she's had for about 6 months. Electronic Signature(s) Signed: 06/08/2015 10:13:55 AM By: Judene Companion MD Entered By: Judene Companion on 05/22/2015 16:13:00 [name] ([id number]) -------------------------------------------------------------------------------- HPI Details Patient Name: [name], [name] Date of Service: 05/22/2015 2:45 PM Medical Record Number: [id number] Patient Account Number: [id number] Date of Birth/Sex: November 24, 1927 (82 y.o. Female) Treating RN: Carolyne Fiscal, Debi Primary Care Physician: Ria Bush Other Clinician: Referring Physician: Ria Bush Treating Physician/Extender: Benjaman Pott in Treatment: 40 History of Present Illness Location: ulcer in the right lower extremity for several months Quality: Patient reports experiencing a dull pain to affected area(s). Severity: Patient states wound (s) are getting better. Duration: Patient has had the wound for > 3 months prior to seeking treatment at the wound center Timing: Pain in wound is Intermittent (comes and goes Context: The wound appeared gradually over time Modifying Factors: Consults to this date include: vascular surgery for the  veins in December 2015 and she's been seeing the wound care center at [facility] for several months. Associated Signs and Symptoms: Patient reports having difficulty standing for long periods. HPI Description: This is an 82 yrs old female we have been following since the beginning of January for a wound on the right medial ankle. She had previously had venous ablation of the greater saphenous vein by Dr. Kellie Simmering. She came to [place] with erythema of the right leg and subsequently is opened in the medial aspect of the right ankle. She has not been very compliant with compression. We ordered her at juxta lite stocking, however she apparently has not been able to wear it. The area on the medial aspect of her left ankle has much less induration and erythema. The edema is better controlled. No evidence of infection is noted. 07/18/14; no major change in the condition of this wound. There is still surface slough and probably some drainage. 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 All other veins showed no evidence of DVT, superficial venous thrombosis or incompetence. however she does have manifestations of chronic venous hypertension and I believe we will change her dressing over to Prisma and a Profore light dressing. she sees her primary care doctor regularly and her last hemoglobin A1c has been around 7%.her other comorbidities are hypertension, CABG in 1998, peptic ulcer disease, history of echo with a ejection fraction of 55-60% and grade 1 diastolic dysfunction. past surgical history include coronary artery bypass graft in 1998, cholecystectomy, appendectomy, hand surgery, endovenous ablation of the saphenous vein with laser by Dr. Kellie Simmering in December 2015. 08/29/2014 - she complains of increased calf pain over the past week. removed her compression bandage and pain improved. no  fever or chills. Mild clear drainage. 09/04/2014 -- she has not been compliant and has  not done a dressing change as we had ordered last week. She always complains and does not keep her wrap on and this past week she has done the same. She has completed a course of doxycycline. 09/25/2014 -- she has tolerated her dressing all week and has not removed it. She says it's hurting a lot today and would not like any debridement. We will do so as per her wishes. she did get a appointment with the vascular surgeon and sees him on 6/28. [name] 10/03/2014 - She was seen by Dr. Tinnie Gens 09/30/2014. Assessment: I reviewed the previous venous duplex exam study performed in September 2015 which reveals no options for treating this venous ulcer. There is no significant superficial venous reflux to treat with laser ablation.Most recent ABI was 0.91 in the right foot with palpable dorsalis pedis pulse at 3+ Plan: patient needs to return to the [facility] wound center and be treated by Dr. Con Memos. No arterial or venous options available from vascular standpoint or treatment of right leg stasis ulcer.Continued dressing changes and compression is the treatment of choice 10/16/2014 -- today will be her first application of Apligraf. 10/23/2014 -- we are going to be able to apply her second day of Apligraf next week. She is complaining of some tenderness in the region of Achilles tendon and this is possibly due to her wrap. 11/06/2014 -- she did not have Apligraf last week because the wound was not looking good but she says she is feeling much better now. 11/13/2014 -- she has been doing well this week and she is here for her second Apligraf. 11/27/2014 -- she is here for her third application of Apligraf 12/12/2014 -- she is here for her fourth application of Apligraf. Also during these past 3 days she had an injury to her left forearm which she caught against the edge of her desk and  has a lacerated wound which has gone down to the subcutaneous tissue. 12/26/2014 -- she is here for a last/fifth application of Apligraf. 04/10/2015 -- the patient is here for her first application of Puraply. 04/17/2015 -- the patient is here for her second application of Puraply. 04/24/2015 -- her Puraply was not ordered for today and hence he would use a collagen dressing locally. 05/01/2015 -- the patient is here for her third application of Puraply. 05/08/2015 -- the patient has been doing fine and overall has shown much improvement and is here for a routine visit 05/15/2015 -- she was admitted to the hospital with viral gastroenteritis and acute renal failure with dehydration and has been on supportive care since February 7. She was discharged today and has come for her wound care. Details of her hospital admission and therapy reviewed. Electronic Signature(s) Signed: 06/08/2015 10:13:55 AM By: Judene Companion MD Entered By: Judene Companion on 05/22/2015 16:13:46 [name] -------------------------------------------------------------------------------- Physical Exam Details Patient Name: Tokunaga, Ommie C. Date of Service: 05/22/2015 2:45 PM Medical Record Number: [id number] Patient Account Number: [id number] Date of Birth/Sex: [birth year] (82 y.o. Female) Treating RN: Carolyne Fiscal, Debi Primary Care Physician: Ria Bush Other Clinician: Referring Physician: Ria Bush Treating Physician/Extender: Benjaman Pott in Treatment: 40 Electronic Signature(s) Signed: 06/08/2015 10:13:55 AM By: Judene Companion MD Entered By: Judene Companion on 05/22/2015 16:13:54 Iannello, [name] -------------------------------------------------------------------------------- Physician Orders Details Patient Name: [name], [name] Date of Service: 05/22/2015 2:45 PM Medical Record Number: [id number] Patient Account Number: [id number] Date of Birth/Sex: 01-17-1928 (82  y.o. Female) Treating RN: Carolyne Fiscal, Debi Primary  Care Physician: Ria Bush Other Clinician: Referring Physician: Ria Bush Treating Physician/Extender: Benjaman Pott in Treatment: 71 Verbal / Phone Orders: Yes Clinician: Carolyne Fiscal, Debi Read Back and Verified: Yes Diagnosis Coding Wound Cleansing Wound #1 Right,Medial Lower Leg o Cleanse wound with mild soap and water Anesthetic Wound #1 Right,Medial Lower Leg o Topical Lidocaine 4% cream applied to wound bed prior to debridement Skin Barriers/Peri-Wound Care Wound #1 Right,Medial Lower Leg o Skin Prep Primary Wound Dressing Wound #1 Right,Medial Lower Leg o Mepitel One - strei-strips o Other: - collagen dressing Secondary Dressing Wound #1 Right,Medial Lower Leg o ABD pad o Foam - to heel Dressing Change Frequency Wound #1 Right,Medial Lower Leg o Change dressing every week Follow-up Appointments Wound #1 Right,Medial Lower Leg o Return Appointment in 1 week. Edema Control Wound #1 Right,Medial Lower Leg o 2 Layer Compression System - Right Lower Extremity o Elevate legs to the level of the heart and pump ankles as often as possible [name], [name] Loletha Grayer ([id number]) Electronic Signature(s) Signed: 05/22/2015 4:59:51 PM By: Alric Quan Signed: 06/08/2015 10:13:55 AM By: Judene Companion MD Entered By: Alric Quan on 05/22/2015 16:26:26 [name] -------------------------------------------------------------------------------- Problem List Details Patient Name: [name], [name] Date of Service: 05/22/2015 2:45 PM Medical Record Number: [id number] Patient Account Number: [id number] Date of Birth/Sex: 1927-12-25 (82 y.o. Female) Treating RN: Carolyne Fiscal, Debi Primary Care Physician: Ria Bush Other Clinician: Referring Physician: Ria Bush Treating Physician/Extender: Benjaman Pott in Treatment: 1 Active  Problems ICD-10 Encounter Code Description Active Date Diagnosis E11.622 Type 2 diabetes mellitus with other skin ulcer 08/15/2014 Yes E11.610 Type 2 diabetes mellitus with diabetic neuropathic 08/15/2014 Yes arthropathy I87.331 Chronic venous hypertension (idiopathic) with ulcer and 08/15/2014 Yes inflammation of right lower extremity S41.112A Laceration without foreign body of left upper arm, initial 12/12/2014 Yes encounter Inactive Problems Resolved Problems ICD-10 Code Description Active Date Resolved Date L03.115 Cellulitis of right lower limb 08/29/2014 08/29/2014 Electronic Signature(s) Signed: 05/22/2015 4:59:51 PM By: Alric Quan Signed: 06/08/2015 10:13:55 AM By: Judene Companion MD Entered By: Alric Quan on 05/22/2015 16:28:31 [place], [name] -------------------------------------------------------------------------------- Progress Note Details Patient Name: [name], [name] Date of Service: 05/22/2015 2:45 PM Medical Record Number: [id number] Patient Account Number: [id number] Date of Birth/Sex: [birth year] (82 y.o. Female) Treating RN: Carolyne Fiscal, Debi Primary Care Physician: Ria Bush Other Clinician: Referring Physician: Ria Bush Treating Physician/Extender: Benjaman Pott in Treatment: 16 Subjective Chief Complaint Information obtained from Patient Patient presents for treatment of an open ulcer due to venous insufficiency. 82 year old patient who is known to me from the [facility] wound care center now comes in follow up here for a chronic venous ulceration of her right lower extremity which she's had for about 6 months. History of Present Illness (HPI) The following HPI elements were documented for the patient's wound: Location: ulcer in the right lower extremity for several months Quality: Patient reports experiencing a dull pain to affected area(s). Severity: Patient states wound (s) are getting better. Duration: Patient  has had the wound for > 3 months prior to seeking treatment at the wound center Timing: Pain in wound is Intermittent (comes and goes Context: The wound appeared gradually over time Modifying Factors: Consults to this date include: vascular surgery for the veins in December 2015 and she's been seeing the wound care center at [facility] for several months. Associated Signs and Symptoms: Patient reports having difficulty standing for long periods. This is an 82 yrs old female we have been following since the beginning  of January for a wound on the right medial ankle. She had previously had venous ablation of the greater saphenous vein by Dr. Kellie Simmering. She came to [place] with erythema of the right leg and subsequently is opened in the medial aspect of the right ankle. She has not been very compliant with compression. We ordered her at juxta lite stocking, however she apparently has not been able to wear it. The area on the medial aspect of her left ankle has much less induration and erythema. The edema is better controlled. No evidence of infection is noted. 07/18/14; no major change in the condition of this wound. There is still surface slough and probably some drainage. 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 All other veins showed no evidence of DVT, superficial venous thrombosis or incompetence. however she does have manifestations of chronic venous hypertension and I believe we will change her dressing over to Prisma and a Profore light dressing. she sees her primary care doctor regularly and her last hemoglobin A1c has been around 7%.her other comorbidities are hypertension, CABG in 1998, peptic ulcer disease, history of echo with a ejection fraction of 55-60% and grade 1 diastolic dysfunction. past surgical history include coronary artery bypass graft in 1998,  cholecystectomy, appendectomy, hand surgery, endovenous ablation of the saphenous vein with laser by Dr. Kellie Simmering in December 2015. [name], [name] ([id number]) 08/29/2014 - she complains of increased calf pain over the past week. removed her compression bandage and pain improved. no fever or chills. Mild clear drainage. 09/04/2014 -- she has not been compliant and has not done a dressing change as we had ordered last week. She always complains and does not keep her wrap on and this past week she has done the same. She has completed a course of doxycycline. 09/25/2014 -- she has tolerated her dressing all week and has not removed it. She says it's hurting a lot today and would not like any debridement. We will do so as per her wishes. she did get a appointment with the vascular surgeon and sees him on 6/28. 10/03/2014 - She was seen by Dr. Tinnie Gens 09/30/2014. Assessment: I reviewed the previous venous duplex exam study performed in September 2015 which reveals no options for treating this venous ulcer. There is no significant superficial venous reflux to treat with laser ablation.Most recent ABI was 0.91 in the right foot with palpable dorsalis pedis pulse at 3+ Plan: patient needs to return to the [place] wound center and be treated by Dr. Con Memos. No arterial or venous options available from vascular standpoint or treatment of right leg stasis ulcer.Continued dressing changes and compression is the treatment of choice 10/16/2014 -- today will be her first application of Apligraf. 10/23/2014 -- we are going to be able to apply her second day of Apligraf next week. She is complaining of some tenderness in the region of Achilles tendon and this is possibly due to her wrap. 11/06/2014 -- she did not have Apligraf last week because the wound was not looking good but she says she is feeling much better now. 11/13/2014 -- she has been doing well this week and she is here for her second  Apligraf. 11/27/2014 -- she is here for her third application of Apligraf 12/12/2014 -- she is here for her fourth application of Apligraf. Also during these past 3 days she had an  injury to her left forearm which she caught against the edge of her desk and has a lacerated wound which has gone down to the subcutaneous tissue. 12/26/2014 -- she is here for a last/fifth application of Apligraf. 04/10/2015 -- the patient is here for her first application of Puraply. 04/17/2015 -- the patient is here for her second application of Puraply. 04/24/2015 -- her Puraply was not ordered for today and hence he would use a collagen dressing locally. 05/01/2015 -- the patient is here for her third application of Puraply. 05/08/2015 -- the patient has been doing fine and overall has shown much improvement and is here for a routine visit 05/15/2015 -- she was admitted to the hospital with viral gastroenteritis and acute renal failure with dehydration and has been on supportive care since February 7. She was discharged today and has come for her wound care. Details of her hospital admission and therapy reviewed. Objective Constitutional Vitals Time Taken: 3:13 PM, Height: 58 in, Weight: 151 lbs, BMI: 31.6, Temperature: 98.1 F, Pulse: 68 [name], [name] ([id number]) bpm, Respiratory Rate: 20 breaths/min, Blood Pressure: 119/55 mmHg. Integumentary (Hair, Skin) Wound #1 status is Open. Original cause of wound was Gradually Appeared. The wound is located on the Right,Medial Lower Leg. The wound measures 2.2cm length x 0.5cm width x 0.2cm depth; 0.864cm^2 area and 0.173cm^3 volume. The wound is limited to skin breakdown. There is no tunneling or undermining noted. There is a medium amount of serosanguineous drainage noted. The wound margin is distinct with the outline attached to the wound base. There is large (67-100%) red, pink granulation within the wound bed. There is a small (1-33%) amount of necrotic  tissue within the wound bed including Adherent Slough. The periwound skin appearance exhibited: Localized Edema, Moist, Mottled, Erythema. The periwound skin appearance did not exhibit: Callus, Crepitus, Excoriation, Fluctuance, Friable, Induration, Rash, Scarring, Dry/Scaly, Maceration, Atrophie Blanche, Cyanosis, Ecchymosis, Hemosiderin Staining, Pallor, Rubor. The surrounding wound skin color is noted with erythema which is circumferential. Periwound temperature was noted as No Abnormality. The periwound has tenderness on palpation. Wound #3 status is Open. Original cause of wound was Gradually Appeared. The wound is located on the Right,Proximal,Medial Lower Leg. The wound measures 2cm length x 0.8cm width x 0.1cm depth; 1.257cm^2 area and 0.126cm^3 volume. The wound is limited to skin breakdown. There is no tunneling or undermining noted. There is a medium amount of serous drainage noted. The wound margin is flat and intact. There is large (67-100%) red granulation within the wound bed. There is no necrotic tissue within the wound bed. The periwound skin appearance exhibited: Moist. The periwound skin appearance did not exhibit: Callus, Crepitus, Excoriation, Fluctuance, Friable, Induration, Localized Edema, Rash, Scarring, Dry/Scaly, Maceration, Atrophie Blanche, Cyanosis, Ecchymosis, Hemosiderin Staining, Mottled, Pallor, Rubor, Erythema. The periwound has tenderness on palpation. Assessment Active Problems ICD-10 E11.622 - Type 2 diabetes mellitus with other skin ulcer E11.610 - Type 2 diabetes mellitus with diabetic neuropathic arthropathy I87.331 - Chronic venous hypertension (idiopathic) with ulcer and inflammation of right lower extremity S41.112A - Laceration without foreign body of left upper arm, initial encounter Plan Wound Cleansing: Wound #1 Right,Medial Lower Leg: Cleanse wound with mild soap and water Anesthetic: [name], [name] ([id number]) Wound #1 Right,Medial  Lower Leg: Topical Lidocaine 4% cream applied to wound bed prior to debridement Skin Barriers/Peri-Wound Care: Wound #1 Right,Medial Lower Leg: Skin Prep Primary Wound Dressing: Wound #1 Right,Medial Lower Leg: Mepitel One - strei-strips Other: - collagen dressing Secondary Dressing: Wound #1 Right,Medial  Lower Leg: ABD pad Foam - to heel Dressing Change Frequency: Wound #1 Right,Medial Lower Leg: Change dressing every week Follow-up Appointments: Wound #1 Right,Medial Lower Leg: Return Appointment in 1 week. Edema Control: Wound #1 Right,Medial Lower Leg: 2 Layer Compression System - Right Lower Extremity Elevate legs to the level of the heart and pump ankles as often as possible Follow-Up Appointments: A follow-up appointment should be scheduled. Medication Reconciliation completed and provided to Patient/Care Provider. A Patient Clinical Summary of Care was provided to LL Debrided venous ulcer with curette and dressed with Prisma and 2 layer wrap Electronic Signature(s) Signed: 06/08/2015 10:13:55 AM By: Judene Companion MD Entered By: Judene Companion on 05/22/2015 16:15:34 Boeckman, [name] -------------------------------------------------------------------------------- SuperBill Details Patient Name: Marcantonio, Kennie C. Date of Service: 05/22/2015 Medical Record Number: [id number] Patient Account Number: [id number] Date of Birth/Sex: 05-Jul-1927 (82 y.o. Female) Treating RN: Carolyne Fiscal, Debi Primary Care Physician: Ria Bush Other Clinician: Referring Physician: Ria Bush Treating Physician/Extender: Benjaman Pott in Treatment: 40 Diagnosis Coding ICD-10 Codes Code Description E11.622 Type 2 diabetes mellitus with other skin ulcer E11.610 Type 2 diabetes mellitus with diabetic neuropathic arthropathy Chronic venous hypertension (idiopathic) with ulcer and inflammation of right lower I87.331 extremity S41.112A Laceration without foreign  body of left upper arm, initial encounter Facility Procedures CPT4 Code: [id number] Description: 99214 - WOUND CARE VISIT-LEV 4 EST PT Modifier: Quantity: 1 Physician Procedures CPT4: Description Modifier Quantity Code [id number] - WC PHYS LEVEL 3 - EST PT 1 ICD-10 Description Diagnosis I87.331 Chronic venous hypertension (idiopathic) with ulcer and inflammation of right lower extremity E11.622 Type 2 diabetes mellitus with  other skin ulcer E11.610 Type 2 diabetes mellitus with diabetic neuropathic arthropathy S41.112A Laceration without foreign body of left upper arm, initial encounter Electronic Signature(s) Signed: 05/22/2015 4:44:37 PM By: Judene Companion MD Entered By: Judene Companion on 05/22/2015 16:44:37

## 2015-06-09 ENCOUNTER — Other Ambulatory Visit: Payer: Self-pay

## 2015-06-09 NOTE — Telephone Encounter (Signed)
Pt left v/m requesting rx hydrocodone apap. Call when ready for pick up. Last rx printed # [id number] on 05/05/15; last seen 05/18/15.

## 2015-06-10 MED ORDER — HYDROCODONE-ACETAMINOPHEN 5-325 MG PO TABS: 0.5000 | ORAL_TABLET | Freq: Four times a day (QID) | ORAL | Status: DC | PRN

## 2015-06-10 NOTE — Telephone Encounter (Signed)
Printed and in Kim's box 

## 2015-06-10 NOTE — Progress Notes (Signed)
CARLIANA, LEINWEBER ([id number]) Visit Report for 06/05/2015 Arrival Information Details Patient Name: [name], [name] Date of Service: 06/05/2015 2:45 PM Medical Record Number: [id number] Patient Account Number: [id number] Date of Birth/Sex: [birth year] (82 y.o. Female) Treating RN: Carolyne Fiscal, Debi Primary Care Physician: Ria Bush Other Clinician: Referring Physician: Ria Bush Treating Physician/Extender: Frann Rider in Treatment: 4 Visit Information History Since Last Visit All ordered tests and consults were completed: No Patient Arrived: Walker Added or deleted any medications: No Arrival Time: 15:10 Any new allergies or adverse reactions: No Accompanied By: self Had a fall or experienced change in No Transfer Assistance: None activities of daily living that may affect Patient Identification Verified: Yes risk of falls: Secondary Verification Process Yes Signs or symptoms of abuse/neglect since last No Completed: visito Patient Requires Transmission- No Hospitalized since last visit: No Based Precautions: Pain Present Now: No Patient Has Alerts: Yes Patient Alerts: Patient on Blood Thinner Type II Diabetic 81 MG aspirin Electronic Signature(s) Signed: 06/09/2015 5:37:49 PM By: Alric Quan Entered By: Alric Quan on 06/05/2015 15:10:52 Card, [name] ([id number]) -------------------------------------------------------------------------------- Clinic Level of Care Assessment Details Patient Name: [name], [name] Date of Service: 06/05/2015 2:45 PM Medical Record Number: [id number] Patient Account Number: [id number] Date of Birth/Sex: [birth year] (82 y.o. Female) Treating RN: Carolyne Fiscal, Debi Primary Care Physician: Ria Bush Other Clinician: Referring Physician: Ria Bush Treating Physician/Extender: Frann Rider in Treatment: 42 Clinic Level of Care Assessment Items TOOL 4 Quantity Score []  - Use when only an  EandM is performed on FOLLOW-UP visit 0 ASSESSMENTS - Nursing Assessment / Reassessment []  - Reassessment of Co-morbidities (includes updates in patient status) 0 X - Reassessment of Adherence to Treatment Plan 1 5 ASSESSMENTS - Wound and Skin Assessment / Reassessment []  - Simple Wound Assessment / Reassessment - one wound 0 X - Complex Wound Assessment / Reassessment - multiple wounds 2 5 []  - Dermatologic / Skin Assessment (not related to wound area) 0 ASSESSMENTS - Focused Assessment []  - Circumferential Edema Measurements - multi extremities 0 []  - Nutritional Assessment / Counseling / Intervention 0 []  - Lower Extremity Assessment (monofilament, tuning fork, pulses) 0 []  - Peripheral Arterial Disease Assessment (using hand held doppler) 0 ASSESSMENTS - Ostomy and/or Continence Assessment and Care []  - Incontinence Assessment and Management 0 []  - Ostomy Care Assessment and Management (repouching, etc.) 0 PROCESS - Coordination of Care X - Simple Patient / Family Education for ongoing care 1 15 []  - Complex (extensive) Patient / Family Education for ongoing care 0 []  - Staff obtains Programmer, systems, Records, Test Results / Process Orders 0 []  - Staff telephones HHA, Nursing Homes / Clarify orders / etc 0 []  - Routine Transfer to another Facility (non-emergent condition) 0 Rennels, Abbygail C. ([id number]) []  - Routine Hospital Admission (non-emergent condition) 0 []  - New Admissions / Biomedical engineer / Ordering NPWT, Apligraf, etc. 0 []  - Emergency Hospital Admission (emergent condition) 0 X - Simple Discharge Coordination 1 10 []  - Complex (extensive) Discharge Coordination 0 PROCESS - Special Needs []  - Pediatric / Minor Patient Management 0 []  - Isolation Patient Management 0 []  - Hearing / Language / Visual special needs 0 []  - Assessment of Community assistance (transportation, D/C planning, etc.) 0 []  - Additional assistance / Altered mentation 0 []  - Support Surface(s)  Assessment (bed, cushion, seat, etc.) 0 INTERVENTIONS - Wound Cleansing / Measurement []  - Simple Wound Cleansing - one wound 0 X - Complex Wound Cleansing - multiple wounds 2 5 X -  Wound Imaging (photographs - any number of wounds) 1 5 []  - Wound Tracing (instead of photographs) 0 []  - Simple Wound Measurement - one wound 0 X - Complex Wound Measurement - multiple wounds 2 5 INTERVENTIONS - Wound Dressings []  - Small Wound Dressing one or multiple wounds 0 []  - Medium Wound Dressing one or multiple wounds 0 X - Large Wound Dressing one or multiple wounds 1 20 X - Application of Medications - topical 1 5 []  - Application of Medications - injection 0 INTERVENTIONS - Miscellaneous []  - External ear exam 0 Roznowski, Reika C. ([id number]) []  - Specimen Collection (cultures, biopsies, blood, body fluids, etc.) 0 []  - Specimen(s) / Culture(s) sent or taken to Lab for analysis 0 []  - Patient Transfer (multiple staff / Harrel Lemon Lift / Similar devices) 0 []  - Simple Staple / Suture removal (25 or less) 0 []  - Complex Staple / Suture removal (26 or more) 0 []  - Hypo / Hyperglycemic Management (close monitor of Blood Glucose) 0 []  - Ankle / Brachial Index (ABI) - do not check if billed separately 0 X - Vital Signs 1 5 Has the patient been seen at the hospital within the last three years: Yes Total Score: 95 Level Of Care: New/Established - Level 3 Electronic Signature(s) Signed: 06/09/2015 5:37:49 PM By: Alric Quan Entered By: Alric Quan on 06/05/2015 16:41:23 Kau, [name] ([id number]) -------------------------------------------------------------------------------- Encounter Discharge Information Details Patient Name: Remlinger, [name] Date of Service: 06/05/2015 2:45 PM Medical Record Number: [id number] Patient Account Number: [id number] Date of Birth/Sex: [birth year] (82 y.o. Female) Treating RN: Carolyne Fiscal, Debi Primary Care Physician: Ria Bush Other  Clinician: Referring Physician: Ria Bush Treating Physician/Extender: Frann Rider in Treatment: 42 Encounter Discharge Information Items Schedule Follow-up Appointment: No Medication Reconciliation completed No and provided to Patient/Care Lanie Schelling: Provided on Clinical Summary of Care: 06/05/2015 Form Type Recipient Paper Patient LL Electronic Signature(s) Signed: 06/05/2015 4:01:27 PM By: Ruthine Dose Entered By: Ruthine Dose on 06/05/2015 16:01:27 Eicher, [name] ([id number]) -------------------------------------------------------------------------------- Lower Extremity Assessment Details Patient Name: Sowash, Aveya C. Date of Service: 06/05/2015 2:45 PM Medical Record Number: [id number] Patient Account Number: [id number] Date of Birth/Sex: 05/18/1927 (82 y.o. Female) Treating RN: Carolyne Fiscal, Debi Primary Care Physician: Ria Bush Other Clinician: Referring Physician: Ria Bush Treating Physician/Extender: Frann Rider in Treatment: 42 Edema Assessment Assessed: [Left: No] [Right: No] E[Left: dema] [Right: :] Calf Left: Right: Point of Measurement: 28 cm From Medial Instep cm 25.8 cm Ankle Left: Right: Point of Measurement: 8 cm From Medial Instep cm 20 cm Vascular Assessment Pulses: Posterior Tibial Dorsalis Pedis Palpable: [Right:Yes] Extremity colors, hair growth, and conditions: Extremity Color: [Right:Mottled] Temperature of Extremity: [Right:Warm] Capillary Refill: [Right:< 3 seconds] Toe Nail Assessment Left: Right: Thick: Yes Discolored: Yes Deformed: No Improper Length and Hygiene: No Electronic Signature(s) Signed: 06/09/2015 5:37:49 PM By: Alric Quan Entered By: Alric Quan on 06/05/2015 15:20:03 Roselle, Teriana CMarland Kitchen ([id number]) -------------------------------------------------------------------------------- Multi Wound Chart Details Patient Name: Winchell, Navayah C. Date of Service: 06/05/2015 2:45  PM Medical Record Number: [id number] Patient Account Number: [id number] Date of Birth/Sex: 04-26-1927 (82 y.o. Female) Treating RN: Carolyne Fiscal, Debi Primary Care Physician: Ria Bush Other Clinician: Referring Physician: Ria Bush Treating Physician/Extender: Frann Rider in Treatment: 42 Vital Signs Height(in): 58 Pulse(bpm): 71 Weight(lbs): 151 Blood Pressure 119/60 (mmHg): Body Mass Index(BMI): 32 Temperature(F): 97.8 Respiratory Rate 20 (breaths/min): Photos: [1:No Photos] [3:No Photos] [N/A:N/A] Wound Location: [1:Right Lower Leg - Medial Right Lower Leg - Medial,] [3:Proximal] [N/A:N/A] Wounding Event: [1:Gradually Appeared] [  3:Gradually Appeared] [N/A:N/A] Primary Etiology: [1:Venous Leg Ulcer] [3:Venous Leg Ulcer] [N/A:N/A] Comorbid History: [1:Cataracts, Anemia, Coronary Artery Disease, Coronary Artery Disease, Hypertension, Myocardial Hypertension, Myocardial Infarction, Type II Diabetes, Osteoarthritis Diabetes, Osteoarthritis] [3:Cataracts, Anemia, Infarction, Type II]  [N/A:N/A] Date Acquired: [1:04/04/2014] [3:05/15/2015] [N/A:N/A] Weeks of Treatment: [1:42] [3:3] [N/A:N/A] Wound Status: [1:Open] [3:Open] [N/A:N/A] Measurements L x W x D 3.1x0.5x0.1 [3:1.7x0.4x0.1] [N/A:N/A] (cm) Area (cm) : [1:1.217] [3:0.534] [N/A:N/A] Volume (cm) : [1:0.122] [3:0.053] [N/A:N/A] % Reduction in Area: [1:79.30%] [3:-6.20%] [N/A:N/A] % Reduction in Volume: 93.10% [3:-6.00%] [N/A:N/A] Classification: [1:Full Thickness Without Exposed Support Structures] [3:Partial Thickness] [N/A:N/A] HBO Classification: [1:Grade 1] [3:Grade 1] [N/A:N/A] Exudate Amount: [1:Large] [3:Medium] [N/A:N/A] Exudate Type: [1:Serosanguineous] [3:Serous] [N/A:N/A] Exudate Color: [1:red, brown] [3:amber] [N/A:N/A] Wound Margin: [1:Distinct, outline attached Flat and Intact] [N/A:N/A] Granulation Amount: [1:Large (67-100%)] [3:Large (67-100%)] [N/A:N/A] Granulation Quality: [1:Red,  Pink] [3:Red, Pink] [N/A:N/A] Necrotic Amount: [1:Small (1-33%)] [3:Small (1-33%)] [N/A:N/A] Exposed Structures: Fascia: No Fascia: No N/A Fat: No Fat: No Tendon: No Tendon: No Muscle: No Muscle: No Joint: No Joint: No Bone: No Bone: No Limited to Skin Limited to Skin Breakdown Breakdown Epithelialization: None None N/A Periwound Skin Texture: Edema: Yes Edema: No N/A Excoriation: No Excoriation: No Induration: No Induration: No Callus: No Callus: No Crepitus: No Crepitus: No Fluctuance: No Fluctuance: No Friable: No Friable: No Rash: No Rash: No Scarring: No Scarring: No Periwound Skin Moist: Yes Moist: Yes N/A Moisture: Maceration: No Maceration: No Dry/Scaly: No Dry/Scaly: No Periwound Skin Color: Erythema: Yes Atrophie Blanche: No N/A Mottled: Yes Cyanosis: No Atrophie Blanche: No Ecchymosis: No Cyanosis: No Erythema: No Ecchymosis: No Hemosiderin Staining: No Hemosiderin Staining: No Mottled: No Pallor: No Pallor: No Rubor: No Rubor: No Erythema Location: Circumferential N/A N/A Temperature: No Abnormality No Abnormality N/A Tenderness on Yes Yes N/A Palpation: Wound Preparation: Ulcer Cleansing: Other: Ulcer Cleansing: Other: N/A soap and water soap and water Topical Anesthetic Topical Anesthetic Applied: Other: lidocaine Applied: None, Other: 4% lidocaine 4% Treatment Notes Electronic Signature(s) Signed: 06/09/2015 5:37:49 PM By: Alric Quan Entered By: Alric Quan on 06/05/2015 15:24:54 Stradling, [name] ([id number]) -------------------------------------------------------------------------------- [place] Details Patient Name: Fury, Cenia C. Date of Service: 06/05/2015 2:45 PM Medical Record Number: [id number] Patient Account Number: [id number] Date of Birth/Sex: 1928/01/06 (82 y.o. Female) Treating RN: Carolyne Fiscal, Debi Primary Care Physician: Ria Bush Other Clinician: Referring Physician:  Ria Bush Treating Physician/Extender: Frann Rider in Treatment: 31 Active Inactive Abuse / Safety / Falls / Self Care Management Nursing Diagnoses: Potential for falls Goals: Patient will remain injury free Date Initiated: 08/15/2014 Goal Status: Active Interventions: Assess fall risk on admission and as needed Notes: Nutrition Nursing Diagnoses: Impaired glucose control: actual or potential Goals: Patient/caregiver verbalizes understanding of need to maintain therapeutic glucose control per primary care physician Date Initiated: 08/15/2014 Goal Status: Active Interventions: Assess patient nutrition upon admission and as needed per policy Notes: Orientation to the Wound Care Program Nursing Diagnoses: Knowledge deficit related to the wound healing center program Goals: Patient/caregiver will verbalize understanding of the [place] JAYLINNE, SLAPPEY ([id number]) Date Initiated: 08/15/2014 Goal Status: Active Interventions: Provide education on orientation to the wound center Notes: Venous Leg Ulcer Nursing Diagnoses: Actual venous Insuffiency (use after diagnosis is confirmed) Goals: Patient will maintain optimal edema control Date Initiated: 08/15/2014 Goal Status: Active Interventions: Compression as ordered Treatment Activities: Test ordered outside of clinic : 06/05/2015 Notes: Wound/Skin Impairment Nursing Diagnoses: Impaired tissue integrity Goals: Patient/caregiver will verbalize understanding of skin care regimen Date Initiated: 08/15/2014  Goal Status: Active Interventions: Assess patient/caregiver ability to obtain necessary supplies Notes: Electronic Signature(s) Signed: 06/09/2015 5:37:49 PM By: Alric Quan Entered By: Alric Quan on 06/05/2015 15:24:47 Ramser, Maritta C. ([id number]) -------------------------------------------------------------------------------- Pain Assessment Details Patient Name:  Suttles, Marguerette C. Date of Service: 06/05/2015 2:45 PM Medical Record Number: [id number] Patient Account Number: [id number] Date of Birth/Sex: 1927-09-30 (82 y.o. Female) Treating RN: Carolyne Fiscal, Debi Primary Care Physician: Ria Bush Other Clinician: Referring Physician: Ria Bush Treating Physician/Extender: Frann Rider in Treatment: 42 Active Problems Location of Pain Severity and Description of Pain Patient Has [name] No Site Locations Pain Management and Medication Current Pain Management: Electronic Signature(s) Signed: 06/09/2015 5:37:49 PM By: Alric Quan Entered By: Alric Quan on 06/05/2015 15:10:57 Spivak, [name] ([id number]) -------------------------------------------------------------------------------- Patient/Caregiver Education Details Patient Name: Kitchings, Makyra C. Date of Service: 06/05/2015 2:45 PM Medical Record Number: [id number] Patient Account Number: [id number] Date of Birth/Gender: 1927-05-02 (82 y.o. Female) Treating RN: Carolyne Fiscal, Debi Primary Care Physician: Ria Bush Other Clinician: Referring Physician: Ria Bush Treating Physician/Extender: Frann Rider in Treatment: 16 Education Assessment Education Provided To: Patient Education Topics Provided Wound/Skin Impairment: Handouts: Other: do not get wrap wet Methods: Demonstration, Explain/Verbal Responses: State content correctly Electronic Signature(s) Signed: 06/09/2015 5:37:49 PM By: Alric Quan Entered By: Alric Quan on 06/05/2015 16:01:55 Stuckey, Wyonia C. ([id number]) -------------------------------------------------------------------------------- Wound Assessment Details Patient Name: Rodman, Leanah C. Date of Service: 06/05/2015 2:45 PM Medical Record Number: [id number] Patient Account Number: [id number] Date of Birth/Sex: 1928-01-22 (82 y.o. Female) Treating RN: Carolyne Fiscal, Debi Primary Care Physician: Ria Bush Other  Clinician: Referring Physician: Ria Bush Treating Physician/Extender: Frann Rider in Treatment: 42 Wound Status Wound Number: 1 Primary Venous Leg Ulcer Etiology: Wound Location: Right Lower Leg - Medial Wound Open Wounding Event: Gradually Appeared Status: Date Acquired: 04/04/2014 Comorbid Cataracts, Anemia, Coronary Artery Weeks Of Treatment: 42 History: Disease, Hypertension, Myocardial Clustered Wound: No Infarction, Type II Diabetes, Osteoarthritis Photos Photo Uploaded By: Alric Quan on 06/05/2015 16:35:18 Wound Measurements Length: (cm) 3.1 Width: (cm) 0.5 Depth: (cm) 0.1 Area: (cm) 1.217 Volume: (cm) 0.122 % Reduction in Area: 79.3% % Reduction in Volume: 93.1% Epithelialization: None Tunneling: No Undermining: No Wound Description Full Thickness Without Exposed Foul Odor Af Classification: Support Structures Diabetic Severity Grade 1 (Wagner): Wound Margin: Distinct, outline attached Exudate Amount: Large Exudate Type: Serosanguineous Exudate Color: red, brown ter Cleansing: No Wound Bed Schinke, Mckaylah C. ([id number]) Granulation Amount: Large (67-100%) Exposed Structure Granulation Quality: Red, Pink Fascia Exposed: No Necrotic Amount: Small (1-33%) Fat Layer Exposed: No Necrotic Quality: Adherent Slough Tendon Exposed: No Muscle Exposed: No Joint Exposed: No Bone Exposed: No Limited to Skin Breakdown Periwound Skin Texture Texture Color No Abnormalities Noted: No No Abnormalities Noted: No Callus: No Atrophie Blanche: No Crepitus: No Cyanosis: No Excoriation: No Ecchymosis: No Fluctuance: No Erythema: Yes Friable: No Erythema Location: Circumferential Induration: No Hemosiderin Staining: No Localized Edema: Yes Mottled: Yes Rash: No Pallor: No Scarring: No Rubor: No Moisture Temperature / Pain No Abnormalities Noted: No Temperature: No Abnormality Dry / Scaly: No Tenderness on Palpation:  Yes Maceration: No Moist: Yes Wound Preparation Ulcer Cleansing: Other: soap and water, Topical Anesthetic Applied: Other: lidocaine 4%, Treatment Notes Wound #1 (Right, Medial Lower Leg) 1. Cleansed with: Cleanse wound with antibacterial soap and water 2. Anesthetic Topical Lidocaine 4% cream to wound bed prior to debridement 5. Secondary Dressing Applied Dry Gauze 7. Secured with Tape 2 Layer Compression System - Right Lower Extremity Notes telfa Electronic Signature(s) Bostic, Meleane C. ([id number])  Signed: 06/09/2015 5:37:49 PM By: Alric Quan Entered By: Alric Quan on 06/05/2015 15:22:12 Izquierdo, Florice Loletha Grayer ([id number]) -------------------------------------------------------------------------------- Wound Assessment Details Patient Name: Borneman, Quynn C. Date of Service: 06/05/2015 2:45 PM Medical Record Number: [id number] Patient Account Number: [id number] Date of Birth/Sex: January 12, 1928 (82 y.o. Female) Treating RN: Carolyne Fiscal, Debi Primary Care Physician: Ria Bush Other Clinician: Referring Physician: Ria Bush Treating Physician/Extender: Frann Rider in Treatment: 42 Wound Status Wound Number: 3 Primary Venous Leg Ulcer Etiology: Wound Location: Right Lower Leg - Medial, Proximal Wound Open Status: Wounding Event: Gradually Appeared Comorbid Cataracts, Anemia, Coronary Artery Date Acquired: 05/15/2015 History: Disease, Hypertension, Myocardial Weeks Of Treatment: 3 Infarction, Type II Diabetes, Clustered Wound: No Osteoarthritis Photos Photo Uploaded By: Alric Quan on 06/05/2015 16:35:18 Wound Measurements Length: (cm) 1.7 Width: (cm) 0.4 Depth: (cm) 0.1 Area: (cm) 0.534 Volume: (cm) 0.053 % Reduction in Area: -6.2% % Reduction in Volume: -6% Epithelialization: None Tunneling: No Undermining: No Wound Description Classification: Partial Thickness Foul Odor Af Diabetic Severity (Wagner): Grade 1 Wound  Margin: Flat and Intact Exudate Amount: Medium Exudate Type: Serous Exudate Color: amber ter Cleansing: No Wound Bed Granulation Amount: Large (67-100%) Exposed Structure Granulation Quality: Red, Pink Fascia Exposed: No Cilento, Cordia C. ([id number]) Necrotic Amount: Small (1-33%) Fat Layer Exposed: No Necrotic Quality: Adherent Slough Tendon Exposed: No Muscle Exposed: No Joint Exposed: No Bone Exposed: No Limited to Skin Breakdown Periwound Skin Texture Texture Color No Abnormalities Noted: No No Abnormalities Noted: No Callus: No Atrophie Blanche: No Crepitus: No Cyanosis: No Excoriation: No Ecchymosis: No Fluctuance: No Erythema: No Friable: No Hemosiderin Staining: No Induration: No Mottled: No Localized Edema: No Pallor: No Rash: No Rubor: No Scarring: No Temperature / Pain Moisture Temperature: No Abnormality No Abnormalities Noted: No Tenderness on Palpation: Yes Dry / Scaly: No Maceration: No Moist: Yes Wound Preparation Ulcer Cleansing: Other: soap and water, Topical Anesthetic Applied: None, Other: lidocaine 4%, Treatment Notes Wound #3 (Right, Proximal, Medial Lower Leg) 1. Cleansed with: Cleanse wound with antibacterial soap and water 2. Anesthetic Topical Lidocaine 4% cream to wound bed prior to debridement 5. Secondary Dressing Applied Dry Gauze 7. Secured with Tape 2 Layer Compression System - Right Lower Extremity Notes telfa Electronic Signature(s) Signed: 06/09/2015 5:37:49 PM By: Alric Quan Entered By: Alric Quan on 06/05/2015 15:24:31 Ekstrom, [name] ([id number]) Morain, [name] ([id number]) -------------------------------------------------------------------------------- Vitals Details Patient Name: Remer, Catheline C. Date of Service: 06/05/2015 2:45 PM Medical Record Number: [id number] Patient Account Number: [id number] Date of Birth/Sex: May 16, 1927 (82 y.o. Female) Treating RN: Carolyne Fiscal, Debi Primary Care  Physician: Ria Bush Other Clinician: Referring Physician: Ria Bush Treating Physician/Extender: Frann Rider in Treatment: 42 Vital Signs Time Taken: 15:10 Temperature (F): 97.8 Height (in): 58 Pulse (bpm): 71 Weight (lbs): 151 Respiratory Rate (breaths/min): 20 Body Mass Index (BMI): 31.6 Blood Pressure (mmHg): 119/60 Reference Range: 80 - 120 mg / dl Electronic Signature(s) Signed: 06/09/2015 5:37:49 PM By: Alric Quan Entered By: Alric Quan on 06/05/2015 15:13:13

## 2015-06-10 NOTE — Progress Notes (Signed)
ITHZEL, ENGLEDOW ([id number]) Visit Report for 06/05/2015 Chief Complaint Document Details Patient Name: [name], [name] Date of Service: 06/05/2015 2:45 PM Medical Record Number: [id number] Patient Account Number: [id number] Date of Birth/Sex: 02-14-1928 (82 y.o. Female) Treating RN: Carolyne Fiscal, Debi Primary Care Physician: Ria Bush Other Clinician: Referring Physician: Ria Bush Treating Physician/Extender: Frann Rider in Treatment: 42 Information Obtained from: Patient Chief Complaint Patient presents for treatment of an open ulcer due to venous insufficiency. 82 year old patient who is known to me from the [facility] wound care center now comes in follow up here for a chronic venous ulceration of her right lower extremity which she's had for about 6 months. Electronic Signature(s) Signed: 06/05/2015 4:19:56 PM By: Christin Fudge MD, FACS Entered By: Christin Fudge on 06/05/2015 16:19:56 Horvath, [name] ([id number]) -------------------------------------------------------------------------------- HPI Details Patient Name: [name], [name] Date of Service: 06/05/2015 2:45 PM Medical Record Number: [id number] Patient Account Number: [id number] Date of Birth/Sex: 12-09-1927 (82 y.o. Female) Treating RN: Carolyne Fiscal, Debi Primary Care Physician: Ria Bush Other Clinician: Referring Physician: Ria Bush Treating Physician/Extender: Frann Rider in Treatment: 42 History of Present Illness Location: ulcer in the right lower extremity for several months Quality: Patient reports experiencing a dull pain to affected area(s). Severity: Patient states wound (s) are getting better. Duration: Patient has had the wound for > 3 months prior to seeking treatment at the wound center Timing: Pain in wound is Intermittent (comes and goes Context: The wound appeared gradually over time Modifying Factors: Consults to this date include: vascular surgery for  the veins in December 2015 and she's been seeing the wound care center at [facility] for several months. Associated Signs and Symptoms: Patient reports having difficulty standing for long periods. HPI Description: This is an 82 yrs old female we have been following since the beginning of January for a wound on the right medial ankle. She had previously had venous ablation of the greater saphenous vein by Dr. Kellie Simmering. She came to [place] with erythema of the right leg and subsequently is opened in the medial aspect of the right ankle. She has not been very compliant with compression. We ordered her at juxta lite stocking, however she apparently has not been able to wear it. The area on the medial aspect of her left ankle has much less induration and erythema. The edema is better controlled. No evidence of infection is noted. 07/18/14; no major change in the condition of this wound. There is still surface slough and probably some drainage. 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 All other veins showed no evidence of DVT, superficial venous thrombosis or incompetence. however she does have manifestations of chronic venous hypertension and I believe we will change her dressing over to Prisma and a Profore light dressing. she sees her primary care doctor regularly and her last hemoglobin A1c has been around 7%.her other comorbidities are hypertension, CABG in 1998, peptic ulcer disease, history of echo with a ejection fraction of 55-60% and grade 1 diastolic dysfunction. past surgical history include coronary artery bypass graft in 1998, cholecystectomy, appendectomy, hand surgery, endovenous ablation of the saphenous vein with laser by Dr. Kellie Simmering in December 2015. 08/29/2014 - she complains of increased calf pain over the past week. removed her compression bandage and pain improved.  no fever or chills. Mild clear drainage. 09/04/2014 -- she has not been compliant and  has not done a dressing change as we had ordered last week. She always complains and does not keep her wrap on and this past week she has done the same. She has completed a course of doxycycline. 09/25/2014 -- she has tolerated her dressing all week and has not removed it. She says it's hurting a lot today and would not like any debridement. We will do so as per her wishes. she did get a appointment with the vascular surgeon and sees him on 6/28. [name], [name] ([id number]) 10/03/2014 - She was seen by Dr. Tinnie Gens 09/30/2014. Assessment: I reviewed the previous venous duplex exam study performed in September 2015 which reveals no options for treating this venous ulcer. There is no significant superficial venous reflux to treat with laser ablation.Most recent ABI was 0.91 in the right foot with palpable dorsalis pedis pulse at 3+ Plan: patient needs to return to the [place] wound center and be treated by Dr. Con Memos. No arterial or venous options available from vascular standpoint or treatment of right leg stasis ulcer.Continued dressing changes and compression is the treatment of choice 10/16/2014 -- today will be her first application of Apligraf. 10/23/2014 -- we are going to be able to apply her second day of Apligraf next week. She is complaining of some tenderness in the region of Achilles tendon and this is possibly due to her wrap. 11/06/2014 -- she did not have Apligraf last week because the wound was not looking good but she says she is feeling much better now. 11/13/2014 -- she has been doing well this week and she is here for her second Apligraf. 11/27/2014 -- she is here for her third application of Apligraf 12/12/2014 -- she is here for her fourth application of Apligraf. Also during these past 3 days she had an injury to her left forearm which she caught against the edge of her desk and  has a lacerated wound which has gone down to the subcutaneous tissue. 12/26/2014 -- she is here for a last/fifth application of Apligraf. 04/10/2015 -- the patient is here for her first application of Puraply. 04/17/2015 -- the patient is here for her second application of Puraply. 04/24/2015 -- her Puraply was not ordered for today and hence he would use a collagen dressing locally. 05/01/2015 -- the patient is here for her third application of Puraply. 05/08/2015 -- the patient has been doing fine and overall has shown much improvement and is here for a routine visit 05/15/2015 -- she was admitted to the hospital with viral gastroenteritis and acute renal failure with dehydration and has been on supportive care since February 7. She was discharged today and has come for her wound care. Details of her hospital admission and therapy reviewed. Electronic Signature(s) Signed: 06/05/2015 4:20:02 PM By: Christin Fudge MD, FACS Entered By: Christin Fudge on 06/05/2015 16:20:02 Ferrington, [name] ([id number]) -------------------------------------------------------------------------------- Physical Exam Details Patient Name: Markman, Klynn C. Date of Service: 06/05/2015 2:45 PM Medical Record Number: [id number] Patient Account Number: [id number] Date of Birth/Sex: [birth year] (82 y.o. Female) Treating RN: Carolyne Fiscal, Debi Primary Care Physician: Ria Bush Other Clinician: Referring Physician: Ria Bush Treating Physician/Extender: Frann Rider in Treatment: 42 Constitutional . Pulse regular. Respirations normal and unlabored. Afebrile. . Eyes Nonicteric. Reactive to light. Ears, Nose, Mouth, and Throat Lips, teeth, and gums WNL.Marland Kitchen Moist mucosa without lesions. Neck supple and nontender. No palpable supraclavicular or cervical adenopathy. Normal sized without goiter. Respiratory WNL. No retractions.. Cardiovascular Pedal Pulses WNL. No clubbing, cyanosis or  edema. Chest Breasts symmetical and no nipple discharge.. Breast tissue WNL, no masses, lumps, or tenderness.. Lymphatic No adneopathy. No adenopathy. No adenopathy. Musculoskeletal Adexa without tenderness or enlargement.. Digits and nails w/o clubbing, cyanosis, infection, petechiae, ischemia, or inflammatory conditions.. Integumentary (Hair, Skin) No suspicious lesions. No crepitus or fluctuance. No peri-wound warmth or erythema. No masses.Marland Kitchen Psychiatric Judgement and insight Intact.. No evidence of depression, anxiety, or agitation.. Notes he has developed a mild amount of hypergranulation tissue which went client with saline lead briskly and have controlled this with silver nitrate. Her lymphedema is well controlled. Electronic Signature(s) Signed: 06/05/2015 4:21:02 PM By: Christin Fudge MD, FACS Entered By: Christin Fudge on 06/05/2015 16:21:02 Schulte, [name] ([id number]) -------------------------------------------------------------------------------- Physician Orders Details Patient Name: [name], [name] Date of Service: 06/05/2015 2:45 PM Medical Record Number: [id number] Patient Account Number: [id number] Date of Birth/Sex: February 08, 1928 (82 y.o. Female) Treating RN: Carolyne Fiscal, Debi Primary Care Physician: Ria Bush Other Clinician: Referring Physician: Ria Bush Treating Physician/Extender: Frann Rider in Treatment: 86 Verbal / Phone Orders: Yes Clinician: Carolyne Fiscal, Debi Read Back and Verified: Yes Diagnosis Coding Wound Cleansing Wound #1 Right,Medial Lower Leg o Cleanse wound with mild soap and water Anesthetic Wound #1 Right,Medial Lower Leg o Topical Lidocaine 4% cream applied to wound bed prior to debridement Skin Barriers/Peri-Wound Care Wound #1 Right,Medial Lower Leg o Skin Prep Secondary Dressing Wound #1 Right,Medial Lower Leg o Dry Gauze - and telfa Dressing Change Frequency Wound #1 Right,Medial Lower Leg o Change  dressing every week Follow-up Appointments Wound #1 Right,Medial Lower Leg o Return Appointment in 1 week. Edema Control Wound #1 Right,Medial Lower Leg o 2 Layer Lite Compression System - Right Lower Extremity o Elevate legs to the level of the heart and pump ankles as often as possible Electronic Signature(s) Signed: 06/05/2015 4:29:10 PM By: Christin Fudge MD, FACS Signed: 06/09/2015 5:37:49 PM By: Alric Quan Entered By: Alric Quan on 06/05/2015 15:59:38 Whan, [name] ([id number]) Drapeau, [name] ([id number]) -------------------------------------------------------------------------------- Problem List Details Patient Name: [name], [name] Date of Service: 06/05/2015 2:45 PM Medical Record Number: [id number] Patient Account Number: [id number] Date of Birth/Sex: Jul 04, 1927 (82 y.o. Female) Treating RN: Carolyne Fiscal, Debi Primary Care Physician: Ria Bush Other Clinician: Referring Physician: Ria Bush Treating Physician/Extender: Frann Rider in Treatment: 38 Active Problems ICD-10 Encounter Code Description Active Date Diagnosis E11.622 Type 2 diabetes mellitus with other skin ulcer 08/15/2014 Yes E11.610 Type 2 diabetes mellitus with diabetic neuropathic 08/15/2014 Yes arthropathy I87.331 Chronic venous hypertension (idiopathic) with ulcer and 08/15/2014 Yes inflammation of right lower extremity S41.112A Laceration without foreign body of left upper arm, initial 12/12/2014 Yes encounter Inactive Problems Resolved Problems ICD-10 Code Description Active Date Resolved Date L03.115 Cellulitis of right lower limb 08/29/2014 08/29/2014 Electronic Signature(s) Signed: 06/05/2015 4:19:49 PM By: Christin Fudge MD, FACS Entered By: Christin Fudge on 06/05/2015 16:19:49 Myrick, Kellene Loletha Grayer ([id number]) -------------------------------------------------------------------------------- Progress Note Details Patient Name: [name], [name] Date of  Service: 06/05/2015 2:45 PM Medical Record Number: [id number] Patient Account Number: [id number] Date of Birth/Sex: Dec 19, 1927 (82 y.o. Female) Treating RN: Carolyne Fiscal, Debi Primary Care Physician: Ria Bush Other Clinician: Referring Physician: Ria Bush Treating Physician/Extender: Frann Rider in Treatment: 42 Subjective Chief Complaint Information obtained from Patient Patient presents for treatment of an open ulcer due to venous insufficiency. 82 year old patient who is known to me from the [facility] wound care center now comes in follow up here for a chronic venous ulceration of her right lower extremity which she's had for about 6  months. History of Present Illness (HPI) The following HPI elements were documented for the patient's wound: Location: ulcer in the right lower extremity for several months Quality: Patient reports experiencing a dull pain to affected area(s). Severity: Patient states wound (s) are getting better. Duration: Patient has had the wound for > 3 months prior to seeking treatment at the wound center Timing: Pain in wound is Intermittent (comes and goes Context: The wound appeared gradually over time Modifying Factors: Consults to this date include: vascular surgery for the veins in December 2015 and she's been seeing the wound care center at [facility] for several months. Associated Signs and Symptoms: Patient reports having difficulty standing for long periods. This is an 82 yrs old female we have been following since the beginning of January for a wound on the right medial ankle. She had previously had venous ablation of the greater saphenous vein by Dr. Kellie Simmering. She came to [place] with erythema of the right leg and subsequently is opened in the medial aspect of the right ankle. She has not been very compliant with compression. We ordered her at juxta lite stocking, however she apparently has not been able to wear it. The area on the medial  aspect of her left ankle has much less induration and erythema. The edema is better controlled. No evidence of infection is noted. 07/18/14; no major change in the condition of this wound. There is still surface slough and probably some drainage. 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 All other veins showed no evidence of DVT, superficial venous thrombosis or incompetence. however she does have manifestations of chronic venous hypertension and I believe we will change her dressing over to Prisma and a Profore light dressing. she sees her primary care doctor regularly and her last hemoglobin A1c has been around 7%.her other comorbidities are hypertension, CABG in 1998, peptic ulcer disease, history of echo with a ejection fraction of 55-60% and grade 1 diastolic dysfunction. past surgical history include coronary artery bypass graft in 1998, cholecystectomy, appendectomy, hand surgery, endovenous ablation of the saphenous vein with laser by Dr. Kellie Simmering in December 2015. PORTER, PLINER ([id number]) 08/29/2014 - she complains of increased calf pain over the past week. removed her compression bandage and pain improved. no fever or chills. Mild clear drainage. 09/04/2014 -- she has not been compliant and has not done a dressing change as we had ordered last week. She always complains and does not keep her wrap on and this past week she has done the same. She has completed a course of doxycycline. 09/25/2014 -- she has tolerated her dressing all week and has not removed it. She says it's hurting a lot today and would not like any debridement. We will do so as per her wishes. she did get a appointment with the vascular surgeon and sees him on 6/28. 10/03/2014 - She was seen by Dr. Tinnie Gens 09/30/2014. Assessment: I reviewed the previous venous duplex exam study performed  in September 2015 which reveals no options for treating this venous ulcer. There is no significant superficial venous reflux to treat with laser ablation.Most recent ABI was 0.91 in the right foot with palpable dorsalis pedis pulse at 3+ Plan: patient needs to return to the [facility] wound center and be treated by Dr. Con Memos. No arterial or venous options available from vascular standpoint or treatment of right  leg stasis ulcer.Continued dressing changes and compression is the treatment of choice 10/16/2014 -- today will be her first application of Apligraf. 10/23/2014 -- we are going to be able to apply her second day of Apligraf next week. She is complaining of some tenderness in the region of Achilles tendon and this is possibly due to her wrap. 11/06/2014 -- she did not have Apligraf last week because the wound was not looking good but she says she is feeling much better now. 11/13/2014 -- she has been doing well this week and she is here for her second Apligraf. 11/27/2014 -- she is here for her third application of Apligraf 12/12/2014 -- she is here for her fourth application of Apligraf. Also during these past 3 days she had an injury to her left forearm which she caught against the edge of her desk and has a lacerated wound which has gone down to the subcutaneous tissue. 12/26/2014 -- she is here for a last/fifth application of Apligraf. 04/10/2015 -- the patient is here for her first application of Puraply. 04/17/2015 -- the patient is here for her second application of Puraply. 04/24/2015 -- her Puraply was not ordered for today and hence he would use a collagen dressing locally. 05/01/2015 -- the patient is here for her third application of Puraply. 05/08/2015 -- the patient has been doing fine and overall has shown much improvement and is here for a routine visit 05/15/2015 -- she was admitted to the hospital with viral gastroenteritis and acute renal failure with dehydration and  has been on supportive care since February 7. She was discharged today and has come for her wound care. Details of her hospital admission and therapy reviewed. Objective Constitutional Pulse regular. Respirations normal and unlabored. Afebrile. [name], [name] ([id number]) Vitals Time Taken: 3:10 PM, Height: 58 in, Weight: 151 lbs, BMI: 31.6, Temperature: 97.8 F, Pulse: 71 bpm, Respiratory Rate: 20 breaths/min, Blood Pressure: 119/60 mmHg. Eyes Nonicteric. Reactive to light. Ears, Nose, Mouth, and Throat Lips, teeth, and gums WNL.Marland Kitchen Moist mucosa without lesions. Neck supple and nontender. No palpable supraclavicular or cervical adenopathy. Normal sized without goiter. Respiratory WNL. No retractions.. Cardiovascular Pedal Pulses WNL. No clubbing, cyanosis or edema. Chest Breasts symmetical and no nipple discharge.. Breast tissue WNL, no masses, lumps, or tenderness.. Lymphatic No adneopathy. No adenopathy. No adenopathy. Musculoskeletal Adexa without tenderness or enlargement.. Digits and nails w/o clubbing, cyanosis, infection, petechiae, ischemia, or inflammatory conditions.Marland Kitchen Psychiatric Judgement and insight Intact.. No evidence of depression, anxiety, or agitation.. General Notes: he has developed a mild amount of hypergranulation tissue which went client with saline lead briskly and have controlled this with silver nitrate. Her lymphedema is well controlled. Integumentary (Hair, Skin) No suspicious lesions. No crepitus or fluctuance. No peri-wound warmth or erythema. No masses.. Wound #1 status is Open. Original cause of wound was Gradually Appeared. The wound is located on the Right,Medial Lower Leg. The wound measures 3.1cm length x 0.5cm width x 0.1cm depth; 1.217cm^2 area and 0.122cm^3 volume. The wound is limited to skin breakdown. There is no tunneling or undermining noted. There is a large amount of serosanguineous drainage noted. The wound margin is distinct with  the outline attached to the wound base. There is large (67-100%) red, pink granulation within the wound bed. There is a small (1-33%) amount of necrotic tissue within the wound bed including Adherent Slough. The periwound skin appearance exhibited: Localized Edema, Moist, Mottled, Erythema. The periwound skin appearance did not exhibit: Callus, Crepitus, Excoriation, Fluctuance, Friable, Induration,  Rash, Scarring, Dry/Scaly, Maceration, Atrophie Blanche, Cyanosis, Ecchymosis, Hemosiderin Staining, Pallor, Rubor. The surrounding wound skin color is noted with erythema which is circumferential. Periwound temperature was noted as No Abnormality. The periwound has tenderness on palpation. [name], [name] ([id number]) Wound #3 status is Open. Original cause of wound was Gradually Appeared. The wound is located on the Right,Proximal,Medial Lower Leg. The wound measures 1.7cm length x 0.4cm width x 0.1cm depth; 0.534cm^2 area and 0.053cm^3 volume. The wound is limited to skin breakdown. There is no tunneling or undermining noted. There is a medium amount of serous drainage noted. The wound margin is flat and intact. There is large (67-100%) red, pink granulation within the wound bed. There is a small (1-33%) amount of necrotic tissue within the wound bed including Adherent Slough. The periwound skin appearance exhibited: Moist. The periwound skin appearance did not exhibit: Callus, Crepitus, Excoriation, Fluctuance, Friable, Induration, Localized Edema, Rash, Scarring, Dry/Scaly, Maceration, Atrophie Blanche, Cyanosis, Ecchymosis, Hemosiderin Staining, Mottled, Pallor, Rubor, Erythema. Periwound temperature was noted as No Abnormality. The periwound has tenderness on palpation. Assessment Active Problems ICD-10 E11.622 - Type 2 diabetes mellitus with other skin ulcer E11.610 - Type 2 diabetes mellitus with diabetic neuropathic arthropathy I87.331 - Chronic venous hypertension (idiopathic) with  ulcer and inflammation of right lower extremity S41.112A - Laceration without foreign body of left upper arm, initial encounter I have changed over to Telfa and a gauze dressing to be applied over the ulceration which is minimal. We will then continue a 2 layer compression wrap. In anticipation of a healing I have confirmed that she has compression stockings of the 20-30 mm variety. See her back next week. Plan Wound Cleansing: Wound #1 Right,Medial Lower Leg: Cleanse wound with mild soap and water Anesthetic: Wound #1 Right,Medial Lower Leg: Topical Lidocaine 4% cream applied to wound bed prior to debridement Skin Barriers/Peri-Wound Care: Wound #1 Right,Medial Lower Leg: Skin Prep Secondary Dressing: [name], [name] ([id number]) Wound #1 Right,Medial Lower Leg: Dry Gauze - and telfa Dressing Change Frequency: Wound #1 Right,Medial Lower Leg: Change dressing every week Follow-up Appointments: Wound #1 Right,Medial Lower Leg: Return Appointment in 1 week. Edema Control: Wound #1 Right,Medial Lower Leg: 2 Layer Lite Compression System - Right Lower Extremity Elevate legs to the level of the heart and pump ankles as often as possible I have changed over to Telfa and a gauze dressing to be applied over the ulceration which is minimal. We will then continue a 2 layer compression wrap. In anticipation of a healing I have confirmed that she has compression stockings of the 20-30 mm variety. See her back next week. Electronic Signature(s) Signed: 06/05/2015 4:22:00 PM By: Christin Fudge MD, FACS Entered By: Christin Fudge on 06/05/2015 16:22:00 Harney, [name] ([id number]) -------------------------------------------------------------------------------- SuperBill Details Patient Name: Morgenstern, [name] Date of Service: 06/05/2015 Medical Record Number: [id number] Patient Account Number: [id number] Date of Birth/Sex: April 01, 1928 (82 y.o. Female) Treating RN: Carolyne Fiscal, Debi Primary  Care Physician: Ria Bush Other Clinician: Referring Physician: Ria Bush Treating Physician/Extender: Frann Rider in Treatment: 42 Diagnosis Coding ICD-10 Codes Code Description E11.622 Type 2 diabetes mellitus with other skin ulcer E11.610 Type 2 diabetes mellitus with diabetic neuropathic arthropathy Chronic venous hypertension (idiopathic) with ulcer and inflammation of right lower I87.331 extremity S41.112A Laceration without foreign body of left upper arm, initial encounter Facility Procedures CPT4 Code: [id number] Description: 99213 - WOUND CARE VISIT-LEV 3 EST PT Modifier: Quantity: 1 Physician Procedures CPT4: Description Modifier Quantity Code [id number] 99213 - WC PHYS LEVEL  3 - EST PT 1 ICD-10 Description Diagnosis E11.622 Type 2 diabetes mellitus with other skin ulcer E11.610 Type 2 diabetes 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

## 2015-06-10 NOTE — Telephone Encounter (Signed)
Patient notified and Rx placed up front for pick up. 

## 2015-06-12 ENCOUNTER — Encounter: Payer: Medicare Other | Admitting: Surgery

## 2015-06-12 DIAGNOSIS — S41112A Laceration without foreign body of left upper arm, initial encounter: Secondary | ICD-10-CM | POA: Diagnosis not present

## 2015-06-12 DIAGNOSIS — L97811 Non-pressure chronic ulcer of other part of right lower leg limited to breakdown of skin: Secondary | ICD-10-CM | POA: Diagnosis not present

## 2015-06-12 DIAGNOSIS — I87331 Chronic venous hypertension (idiopathic) with ulcer and inflammation of right lower extremity: Secondary | ICD-10-CM | POA: Diagnosis not present

## 2015-06-12 DIAGNOSIS — I10 Essential (primary) hypertension: Secondary | ICD-10-CM | POA: Diagnosis not present

## 2015-06-12 DIAGNOSIS — E11622 Type 2 diabetes mellitus with other skin ulcer: Secondary | ICD-10-CM | POA: Diagnosis not present

## 2015-06-12 DIAGNOSIS — Z951 Presence of aortocoronary bypass graft: Secondary | ICD-10-CM | POA: Diagnosis not present

## 2015-06-12 DIAGNOSIS — E11610 Type 2 diabetes mellitus with diabetic neuropathic arthropathy: Secondary | ICD-10-CM | POA: Diagnosis not present

## 2015-06-13 NOTE — Progress Notes (Addendum)
PARLIE, OMANS ([id number]) Visit Report for 06/12/2015 Chief Complaint Document Details Patient Name: [name] [name] C. Date of Service: 06/12/2015 2:45 PM Medical Record Number: [id number] Patient Account Number: [id number] Date of Birth/Sex: 19-Dec-1927 (82 y.o. Female) Treating RN: Carolyne Fiscal, Debi Primary Care Physician: Ria Bush Other Clinician: Referring Physician: Ria Bush Treating Physician/Extender: Frann Rider in Treatment: 27 Information Obtained from: Patient Chief Complaint Patient presents for treatment of an open ulcer due to venous insufficiency. 82 year old patient who is known to me from the [facility] wound care center now comes in follow up here for a chronic venous ulceration of her right lower extremity which she's had for about 6 months. Electronic Signature(s) Signed: 06/12/2015 4:10:50 PM By: Christin Fudge MD, FACS Entered By: Christin Fudge on 06/12/2015 16:10:50 [name] [name] Mares ([id number]) -------------------------------------------------------------------------------- HPI Details Patient Name: [name] [name] C. Date of Service: 06/12/2015 2:45 PM Medical Record Number: [id number] Patient Account Number: [id number] Date of Birth/Sex: 1927/09/09 (82 y.o. Female) Treating RN: Carolyne Fiscal, Debi Primary Care Physician: Ria Bush Other Clinician: Referring Physician: Ria Bush Treating Physician/Extender: Frann Rider in Treatment: 30 History of Present Illness Location: ulcer in the right lower extremity for several months Quality: Patient reports experiencing a dull pain to affected area(s). Severity: Patient states wound (s) are getting better. Duration: Patient has had the wound for > 3 months prior to seeking treatment at the wound center Timing: Pain in wound is Intermittent (comes and goes Context: The wound appeared gradually over time Modifying Factors: Consults to this date include: vascular surgery  for the veins in December 2015 and she's been seeing the wound care center at [facility] for several months. Associated Signs and Symptoms: Patient reports having difficulty standing for long periods. HPI Description: This is an 82 yrs old female we have been following since the beginning of January for a wound on the right medial ankle. She had previously had venous ablation of the greater saphenous vein by Dr. Kellie Simmering. She came to [place] with erythema of the right leg and subsequently is opened in the medial aspect of the right ankle. She has not been very compliant with compression. We ordered her at juxta lite stocking, however she apparently has not been able to wear it. The area on the medial aspect of her left ankle has much less induration and erythema. The edema is better controlled. No evidence of infection is noted. 07/18/14; no major change in the condition of this wound. There is still surface slough and probably some drainage. 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 All other veins showed no evidence of DVT, superficial venous thrombosis or incompetence. however she does have manifestations of chronic venous hypertension and I believe we will change her dressing over to Prisma and a Profore light dressing. she sees her primary care doctor regularly and her last hemoglobin A1c has been around 7%.her other comorbidities are hypertension, CABG in 1998, peptic ulcer disease, history of echo with a ejection fraction of 55-60% and grade 1 diastolic dysfunction. past surgical history include coronary artery bypass graft in 1998, cholecystectomy, appendectomy, hand surgery, endovenous ablation of the saphenous vein with laser by Dr. Kellie Simmering in December 2015. 08/29/2014 - she complains of increased calf pain over the past week. removed her compression bandage and pain  improved. no fever or chills. Mild clear drainage. 09/04/2014 -- she has not been compliant and  has not done a dressing change as we had ordered last week. She always complains and does not keep her wrap on and this past week she has done the same. She has completed a course of doxycycline. 09/25/2014 -- she has tolerated her dressing all week and has not removed it. She says it's hurting a lot today and would not like any debridement. We will do so as per her wishes. she did get a appointment with the vascular surgeon and sees him on 6/28. [name] [name] ([id number]) 10/03/2014 - She was seen by Dr. Tinnie Gens 09/30/2014. Assessment: I reviewed the previous venous duplex exam study performed in September 2015 which reveals no options for treating this venous ulcer. There is no significant superficial venous reflux to treat with laser ablation.Most recent ABI was 0.91 in the right foot with palpable dorsalis pedis pulse at 3+ Plan: patient needs to return to the [place] wound center and be treated by Dr. Con Memos. No arterial or venous options available from vascular standpoint or treatment of right leg stasis ulcer.Continued dressing changes and compression is the treatment of choice 10/16/2014 -- today will be her first application of Apligraf. 10/23/2014 -- we are going to be able to apply her second day of Apligraf next week. She is complaining of some tenderness in the region of Achilles tendon and this is possibly due to her wrap. 11/06/2014 -- she did not have Apligraf last week because the wound was not looking good but she says she is feeling much better now. 11/13/2014 -- she has been doing well this week and she is here for her second Apligraf. 11/27/2014 -- she is here for her third application of Apligraf 12/12/2014 -- she is here for her fourth application of Apligraf. Also during these past 3 days she had an injury to her left forearm which she caught against the edge of  her desk and has a lacerated wound which has gone down to the subcutaneous tissue. 12/26/2014 -- she is here for a last/fifth application of Apligraf. 04/10/2015 -- the patient is here for her first application of Puraply. 04/17/2015 -- the patient is here for her second application of Puraply. 04/24/2015 -- her Puraply was not ordered for today and hence he would use a collagen dressing locally. 05/01/2015 -- the patient is here for her third application of Puraply. 05/08/2015 -- the patient has been doing fine and overall has shown much improvement and is here for a routine visit 05/15/2015 -- she was admitted to the hospital with viral gastroenteritis and acute renal failure with dehydration and has been on supportive care since February 7. She was discharged today and has come for her wound care. Details of her hospital admission and therapy reviewed. Electronic Signature(s) Signed: 06/12/2015 4:11:18 PM By: Christin Fudge MD, FACS Entered By: Christin Fudge on 06/12/2015 16:11:18 [name] [name] Mares ([id number]) -------------------------------------------------------------------------------- Physical Exam Details Patient Name: Mclarty, Aslan C. Date of Service: 06/12/2015 2:45 PM Medical Record Number: [id number] Patient Account Number: [id number] Date of Birth/Sex: 02-11-1928 (82 y.o. Female) Treating RN: Carolyne Fiscal, Debi Primary Care Physician: Ria Bush Other Clinician: Referring Physician: Ria Bush Treating Physician/Extender: Frann Rider in Treatment: 43 Constitutional . Pulse regular. Respirations normal and unlabored. Afebrile. . Eyes Nonicteric. Reactive to light. Ears, Nose, Mouth, and Throat Lips, teeth, and gums WNL.Marland Kitchen Moist mucosa without lesions. Neck supple and nontender. No palpable supraclavicular or cervical adenopathy. Normal sized without goiter. Respiratory WNL. No retractions.. Cardiovascular Pedal Pulses WNL. No clubbing, cyanosis or  edema. Lymphatic No adneopathy. No adenopathy. No adenopathy. Musculoskeletal Adexa without tenderness or enlargement.. Digits and nails w/o clubbing, cyanosis, infection, petechiae, ischemia, or inflammatory conditions.. Integumentary (Hair, Skin) No suspicious lesions. No crepitus or fluctuance. No peri-wound warmth or erythema. No masses.Marland Kitchen Psychiatric Judgement and insight Intact.. No evidence of depression, anxiety, or agitation.. Notes the wounds have not completely closed and there are some areas superior-lateral to the previous main wound which has opened out again. Electronic Signature(s) Signed: 06/12/2015 4:12:04 PM By: Christin Fudge MD, FACS Entered By: Christin Fudge on 06/12/2015 16:12:04 [name] [name] Mares ([id number]) -------------------------------------------------------------------------------- Physician Orders Details Patient Name: [name] [name] C. Date of Service: 06/12/2015 2:45 PM Medical Record Number: [id number] Patient Account Number: [id number] Date of Birth/Sex: 27-Jun-1927 (82 y.o. Female) Treating RN: Carolyne Fiscal, Debi Primary Care Physician: Ria Bush Other Clinician: Referring Physician: Ria Bush Treating Physician/Extender: Frann Rider in Treatment: 50 Verbal / Phone Orders: Yes Clinician: Carolyne Fiscal, Debi Read Back and Verified: Yes Diagnosis Coding Wound Cleansing Wound #1 Right,Medial Lower Leg o Cleanse wound with mild soap and water Anesthetic Wound #1 Right,Medial Lower Leg o Topical Lidocaine 4% cream applied to wound bed prior to debridement Skin Barriers/Peri-Wound Care Wound #1 Right,Medial Lower Leg o Skin Prep Secondary Dressing Wound #1 Right,Medial Lower Leg o Foam - cutimed Dressing Change Frequency Wound #1 Right,Medial Lower Leg o Change dressing every week Follow-up Appointments Wound #1 Right,Medial Lower Leg o Return Appointment in 1 week. Edema Control Wound #1 Right,Medial Lower  Leg o 2 Layer Lite Compression System - Right Lower Extremity o Elevate legs to the level of the heart and pump ankles as often as possible Electronic Signature(s) Signed: 06/12/2015 5:05:38 PM By: Christin Fudge MD, FACS Signed: 06/12/2015 7:01:21 PM By: Alric Quan Entered By: Alric Quan on 06/12/2015 16:14:03 Stoklosa, [name] Mares ([id number]) Tarry, [name] Mares ([id number]) -------------------------------------------------------------------------------- Problem List Details Patient Name: [name] [name] C. Date of Service: 06/12/2015 2:45 PM Medical Record Number: [id number] Patient Account Number: [id number] Date of Birth/Sex: 05/03/1927 (83 y.o. Female) Treating RN: Carolyne Fiscal, Debi Primary Care Physician: Ria Bush Other Clinician: Referring Physician: Ria Bush Treating Physician/Extender: Frann Rider in Treatment: 84 Active Problems ICD-10 Encounter Code Description Active Date Diagnosis E11.622 Type 2 diabetes mellitus with other skin ulcer 08/15/2014 Yes E11.610 Type 2 diabetes mellitus with diabetic neuropathic 08/15/2014 Yes arthropathy I87.331 Chronic venous hypertension (idiopathic) with ulcer and 08/15/2014 Yes inflammation of right lower extremity S41.112A Laceration without foreign body of left upper arm, initial 12/12/2014 Yes encounter Inactive Problems Resolved Problems ICD-10 Code Description Active Date Resolved Date L03.115 Cellulitis of right lower limb 08/29/2014 08/29/2014 Electronic Signature(s) Signed: 06/12/2015 4:10:43 PM By: Christin Fudge MD, FACS Entered By: Christin Fudge on 06/12/2015 16:10:43 [name] [name] Mares ([id number]) -------------------------------------------------------------------------------- Progress Note Details Patient Name: [name] [name] C. Date of Service: 06/12/2015 2:45 PM Medical Record Number: [id number] Patient Account Number: [id number] Date of Birth/Sex: 1927/11/06 (82 y.o. Female) Treating RN:  Carolyne Fiscal, Debi Primary Care Physician: Ria Bush Other Clinician: Referring Physician: Ria Bush Treating Physician/Extender: Frann Rider in Treatment: 31 Subjective Chief Complaint Information obtained from Patient Patient presents for treatment of an open ulcer due to venous insufficiency. 82 year old patient who is known to me from the [facility] wound care center now comes in follow up here for a chronic venous ulceration of her right lower extremity which she's had for about 6 months. History of Present Illness (HPI) The following HPI elements were documented for the patient's wound: Location: ulcer in the right lower extremity  for several months Quality: Patient reports experiencing a dull pain to affected area(s). Severity: Patient states wound (s) are getting better. Duration: Patient has had the wound for > 3 months prior to seeking treatment at the wound center Timing: Pain in wound is Intermittent (comes and goes Context: The wound appeared gradually over time Modifying Factors: Consults to this date include: vascular surgery for the veins in December 2015 and she's been seeing the wound care center at [facility] for several months. Associated Signs and Symptoms: Patient reports having difficulty standing for long periods. This is an 82 yrs old female we have been following since the beginning of January for a wound on the right medial ankle. She had previously had venous ablation of the greater saphenous vein by Dr. Kellie Simmering. She came to [place] with erythema of the right leg and subsequently is opened in the medial aspect of the right ankle. She has not been very compliant with compression. We ordered her at juxta lite stocking, however she apparently has not been able to wear it. The area on the medial aspect of her left ankle has much less induration and erythema. The edema is better controlled. No evidence of infection is noted. 07/18/14; no major change  in the condition of this wound. There is still surface slough and probably some drainage. 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 All other veins showed no evidence of DVT, superficial venous thrombosis or incompetence. however she does have manifestations of chronic venous hypertension and I believe we will change her dressing over to Prisma and a Profore light dressing. she sees her primary care doctor regularly and her last hemoglobin A1c has been around 7%.her other comorbidities are hypertension, CABG in 1998, peptic ulcer disease, history of echo with a ejection fraction of 55-60% and grade 1 diastolic dysfunction. past surgical history include coronary artery bypass graft in 1998, cholecystectomy, appendectomy, hand surgery, endovenous ablation of the saphenous vein with laser by Dr. Kellie Simmering in December 2015. LILYANAH, MARTORELLA ([id number]) 08/29/2014 - she complains of increased calf pain over the past week. removed her compression bandage and pain improved. no fever or chills. Mild clear drainage. 09/04/2014 -- she has not been compliant and has not done a dressing change as we had ordered last week. She always complains and does not keep her wrap on and this past week she has done the same. She has completed a course of doxycycline. 09/25/2014 -- she has tolerated her dressing all week and has not removed it. She says it's hurting a lot today and would not like any debridement. We will do so as per her wishes. she did get a appointment with the vascular surgeon and sees him on 6/28. 10/03/2014 - She was seen by Dr. Tinnie Gens 09/30/2014. Assessment: I reviewed the previous venous duplex exam study performed in September 2015 which reveals no options for treating this venous ulcer. There is no significant superficial venous reflux to treat with  laser ablation.Most recent ABI was 0.91 in the right foot with palpable dorsalis pedis pulse at 3+ Plan: patient needs to return to the [place] wound center and be treated by Dr. Con Memos. No arterial or venous options available from vascular standpoint or treatment of right leg stasis ulcer.Continued dressing changes and compression is the treatment of choice 10/16/2014 -- today will be her first application of Apligraf. 10/23/2014 --  we are going to be able to apply her second day of Apligraf next week. She is complaining of some tenderness in the region of Achilles tendon and this is possibly due to her wrap. 11/06/2014 -- she did not have Apligraf last week because the wound was not looking good but she says she is feeling much better now. 11/13/2014 -- she has been doing well this week and she is here for her second Apligraf. 11/27/2014 -- she is here for her third application of Apligraf 12/12/2014 -- she is here for her fourth application of Apligraf. Also during these past 3 days she had an injury to her left forearm which she caught against the edge of her desk and has a lacerated wound which has gone down to the subcutaneous tissue. 12/26/2014 -- she is here for a last/fifth application of Apligraf. 04/10/2015 -- the patient is here for her first application of Puraply. 04/17/2015 -- the patient is here for her second application of Puraply. 04/24/2015 -- her Puraply was not ordered for today and hence he would use a collagen dressing locally. 05/01/2015 -- the patient is here for her third application of Puraply. 05/08/2015 -- the patient has been doing fine and overall has shown much improvement and is here for a routine visit 05/15/2015 -- she was admitted to the hospital with viral gastroenteritis and acute renal failure with dehydration and has been on supportive care since February 7. She was discharged today and has come for her wound care. Details of her hospital admission and  therapy reviewed. Objective Constitutional Pulse regular. Respirations normal and unlabored. Afebrile. [name] [name] C. ([id number]) Vitals Time Taken: 3:35 PM, Height: 58 in, Weight: 151 lbs, BMI: 31.6, Temperature: 97.8 F, Pulse: 72 bpm, Respiratory Rate: 20 breaths/min, Blood Pressure: 108/56 mmHg. Eyes Nonicteric. Reactive to light. Ears, Nose, Mouth, and Throat Lips, teeth, and gums WNL.Marland Kitchen Moist mucosa without lesions. Neck supple and nontender. No palpable supraclavicular or cervical adenopathy. Normal sized without goiter. Respiratory WNL. No retractions.. Cardiovascular Pedal Pulses WNL. No clubbing, cyanosis or edema. Lymphatic No adneopathy. No adenopathy. No adenopathy. Musculoskeletal Adexa without tenderness or enlargement.. Digits and nails w/o clubbing, cyanosis, infection, petechiae, ischemia, or inflammatory conditions.Marland Kitchen Psychiatric Judgement and insight Intact.. No evidence of depression, anxiety, or agitation.. General Notes: the wounds have not completely closed and there are some areas superior-lateral to the previous main wound which has opened out again. Integumentary (Hair, Skin) No suspicious lesions. No crepitus or fluctuance. No peri-wound warmth or erythema. No masses.. Wound #1 status is Open. Original cause of wound was Gradually Appeared. The wound is located on the Right,Medial Lower Leg. The wound measures 1.7cm length x 0.7cm width x 0.1cm depth; 0.935cm^2 area and 0.093cm^3 volume. The wound is limited to skin breakdown. There is no tunneling or undermining noted. There is a large amount of serosanguineous drainage noted. The wound margin is distinct with the outline attached to the wound base. There is large (67-100%) red, pink granulation within the wound bed. There is a small (1-33%) amount of necrotic tissue within the wound bed including Adherent Slough. The periwound skin appearance exhibited: Localized Edema, Maceration, Moist, Mottled,  Erythema. The periwound skin appearance did not exhibit: Callus, Crepitus, Excoriation, Fluctuance, Friable, Induration, Rash, Scarring, Dry/Scaly, Atrophie Blanche, Cyanosis, Ecchymosis, Hemosiderin Staining, Pallor, Rubor. The surrounding wound skin color is noted with erythema which is circumferential. Periwound temperature was noted as No Abnormality. The periwound has tenderness on palpation. Wound #3 status is Open. Original cause of  wound was Gradually Appeared. The wound is located on the Right,Proximal,Medial Lower Leg. The wound measures 3cm length x 1cm width x 0.1cm depth; 2.356cm^2 [name] [name] C. ([id number]) area and 0.236cm^3 volume. The wound is limited to skin breakdown. There is no tunneling or undermining noted. There is a large amount of serous drainage noted. The wound margin is flat and intact. There is medium (34-66%) red, pink granulation within the wound bed. There is a medium (34-66%) amount of necrotic tissue within the wound bed including Eschar and Adherent Slough. The periwound skin appearance exhibited: Maceration, Moist. The periwound skin appearance did not exhibit: Callus, Crepitus, Excoriation, Fluctuance, Friable, Induration, Localized Edema, Rash, Scarring, Dry/Scaly, Atrophie Blanche, Cyanosis, Ecchymosis, Hemosiderin Staining, Mottled, Pallor, Rubor, Erythema. Periwound temperature was noted as No Abnormality. The periwound has tenderness on palpation. Assessment Active Problems ICD-10 E11.622 - Type 2 diabetes mellitus with other skin ulcer E11.610 - Type 2 diabetes mellitus with diabetic neuropathic arthropathy I87.331 - Chronic venous hypertension (idiopathic) with ulcer and inflammation of right lower extremity S41.112A - Laceration without foreign body of left upper arm, initial encounter Plan Wound Cleansing: Wound #1 Right,Medial Lower Leg: Cleanse wound with mild soap and water Anesthetic: Wound #1 Right,Medial Lower Leg: Topical  Lidocaine 4% cream applied to wound bed prior to debridement Skin Barriers/Peri-Wound Care: Wound #1 Right,Medial Lower Leg: Skin Prep Secondary Dressing: Wound #1 Right,Medial Lower Leg: Foam - cutimed Dressing Change Frequency: Wound #1 Right,Medial Lower Leg: Change dressing every week Follow-up Appointments: Wound #1 Right,Medial Lower Leg: Return Appointment in 1 week. Edema Control: Wound #1 Right,Medial Lower Leg: 2 Layer Lite Compression System - Right Lower Extremity [name] [name] C. ([id number]) Elevate legs to the level of the heart and pump ankles as often as possible At this stage the wounds have stalled a bit and I have recommended that we reorder puraply for next week. Today we'll use Cutimed Sorbact foam and her 2 layer wraps and see her back next week Electronic Signature(s) Signed: 06/12/2015 5:06:27 PM By: Christin Fudge MD, FACS Previous Signature: 06/12/2015 4:13:38 PM Version By: Christin Fudge MD, FACS Entered By: Christin Fudge on 06/12/2015 17:06:27 Bernardini, Libbi Loletha Grayer ([id number]) -------------------------------------------------------------------------------- SuperBill Details Patient Name: Kasler, [name] [name] C. Date of Service: 06/12/2015 Medical Record Number: [id number] Patient Account Number: [id number] Date of Birth/Sex: 1927/04/22 (82 y.o. Female) Treating RN: Carolyne Fiscal, Debi Primary Care Physician: Ria Bush Other Clinician: Referring Physician: Ria Bush Treating Physician/Extender: Frann Rider in Treatment: 43 Diagnosis Coding ICD-10 Codes Code Description E11.622 Type 2 diabetes mellitus with other skin ulcer E11.610 Type 2 diabetes mellitus with diabetic neuropathic arthropathy Chronic venous hypertension (idiopathic) with ulcer and inflammation of right lower I87.331 extremity S41.112A Laceration without foreign body of left upper arm, initial encounter Facility Procedures CPT4 Code: [id number] Description: 99213 - WOUND  CARE VISIT-LEV 3 EST PT Modifier: Quantity: 1 Physician Procedures CPT4: Description Modifier Quantity Code [id number] 99213 - WC PHYS LEVEL 3 - EST PT 1 ICD-10 Description Diagnosis E11.622 Type 2 diabetes mellitus with other skin ulcer E11.610 Type 2 diabetes mellitus with diabetic neuropathic arthropathy I87.331 Chronic  venous hypertension (idiopathic) with ulcer and inflammation of right lower extremity S41.112A Laceration without foreign body of left upper arm, initial encounter Electronic Signature(s) Signed: 06/12/2015 7:01:21 PM By: Alric Quan Previous Signature: 06/12/2015 4:14:07 PM Version By: Christin Fudge MD, FACS Entered By: Alric Quan on 06/12/2015 17:19:46

## 2015-06-13 NOTE — Progress Notes (Signed)
TYLA, HEADY ([id number]) Visit Report for 06/12/2015 Arrival Information Details Patient Name: [name], [name] Date of Service: 06/12/2015 2:45 PM Medical Record Number: [id number] Patient Account Number: [id number] Date of Birth/Sex: 05/14/1927 (82 y.o. Female) Treating RN: Carolyne Fiscal, Debi Primary Care Physician: Ria Bush Other Clinician: Referring Physician: Ria Bush Treating Physician/Extender: Frann Rider in Treatment: 75 Visit Information History Since Last Visit All ordered tests and consults were completed: No Patient Arrived: [name] Added or deleted any medications: No Arrival Time: 15:34 Any new allergies or adverse reactions: No Accompanied By: self Had a fall or experienced change in No Transfer Assistance: None activities of daily living that may affect Patient Identification Verified: Yes risk of falls: Secondary Verification Process Yes Signs or symptoms of abuse/neglect since last No Completed: visito Patient Requires Transmission- No Hospitalized since last visit: No Based Precautions: Pain Present Now: No Patient Has Alerts: Yes Patient Alerts: Patient on Blood Thinner Type II Diabetic 81 MG aspirin Electronic Signature(s) Signed: 06/12/2015 7:01:21 PM By: Alric Quan Entered By: Alric Quan on 06/12/2015 15:35:14 [name], [name] ([id number]) -------------------------------------------------------------------------------- Clinic Level of Care Assessment Details Patient Name: [name], [name] Date of Service: 06/12/2015 2:45 PM Medical Record Number: [id number] Patient Account Number: [id number] Date of Birth/Sex: [birth year] (82 y.o. Female) Treating RN: Carolyne Fiscal, Debi Primary Care Physician: Ria Bush Other Clinician: Referring Physician: Ria Bush Treating Physician/Extender: Frann Rider in Treatment: 37 Clinic Level of Care Assessment Items TOOL 4 Quantity Score []  - Use when  only an EandM is performed on FOLLOW-UP visit 0 ASSESSMENTS - Nursing Assessment / Reassessment []  - Reassessment of Co-morbidities (includes updates in patient status) 0 X - Reassessment of Adherence to Treatment Plan 1 5 ASSESSMENTS - Wound and Skin Assessment / Reassessment []  - Simple Wound Assessment / Reassessment - one wound 0 X - Complex Wound Assessment / Reassessment - multiple wounds 2 5 []  - Dermatologic / Skin Assessment (not related to wound area) 0 ASSESSMENTS - Focused Assessment []  - Circumferential Edema Measurements - multi extremities 0 []  - Nutritional Assessment / Counseling / Intervention 0 []  - Lower Extremity Assessment (monofilament, tuning fork, pulses) 0 []  - Peripheral Arterial Disease Assessment (using hand held doppler) 0 ASSESSMENTS - Ostomy and/or Continence Assessment and Care []  - Incontinence Assessment and Management 0 []  - Ostomy Care Assessment and Management (repouching, etc.) 0 PROCESS - Coordination of Care X - Simple Patient / Family Education for ongoing care 1 15 []  - Complex (extensive) Patient / Family Education for ongoing care 0 []  - Staff obtains Programmer, systems, Records, Test Results / Process Orders 0 []  - Staff telephones HHA, Nursing Homes / Clarify orders / etc 0 []  - Routine Transfer to another Facility (non-emergent condition) 0 Matheny, [name] ([id number]) []  - Routine Hospital Admission (non-emergent condition) 0 []  - New Admissions / Biomedical engineer / Ordering NPWT, Apligraf, etc. 0 []  - Emergency Hospital Admission (emergent condition) 0 X - Simple Discharge Coordination 1 10 []  - Complex (extensive) Discharge Coordination 0 PROCESS - Special Needs []  - Pediatric / Minor Patient Management 0 []  - Isolation Patient Management 0 []  - Hearing / Language / Visual special needs 0 []  - Assessment of Community assistance (transportation, D/C planning, etc.) 0 []  - Additional assistance / Altered mentation 0 []  - Support  Surface(s) Assessment (bed, cushion, seat, etc.) 0 INTERVENTIONS - Wound Cleansing / Measurement []  - Simple Wound Cleansing - one wound 0 X - Complex Wound Cleansing - multiple wounds 2 5 X -  Wound Imaging (photographs - any number of wounds) 1 5 []  - Wound Tracing (instead of photographs) 0 []  - Simple Wound Measurement - one wound 0 X - Complex Wound Measurement - multiple wounds 2 5 INTERVENTIONS - Wound Dressings []  - Small Wound Dressing one or multiple wounds 0 []  - Medium Wound Dressing one or multiple wounds 0 X - Large Wound Dressing one or multiple wounds 1 20 X - Application of Medications - topical 1 5 []  - Application of Medications - injection 0 INTERVENTIONS - Miscellaneous []  - External ear exam 0 Gibbins, [name] []  - Specimen Collection (cultures, biopsies, blood, body fluids, etc.) 0 []  - Specimen(s) / Culture(s) sent or taken to Lab for analysis 0 []  - Patient Transfer (multiple staff / Harrel Lemon Lift / Similar devices) 0 []  - Simple Staple / Suture removal (25 or less) 0 []  - Complex Staple / Suture removal (26 or more) 0 []  - Hypo / Hyperglycemic Management (close monitor of Blood Glucose) 0 []  - Ankle / Brachial Index (ABI) - do not check if billed separately 0 X - Vital Signs 1 5 Has the patient been seen at the hospital within the last three years: Yes Total Score: 95 Level Of Care: New/Established - Level 3 Electronic Signature(s) Signed: 06/12/2015 7:01:21 PM By: Alric Quan Entered By: Alric Quan on 06/12/2015 17:19:35 Meissner, [name] -------------------------------------------------------------------------------- Encounter Discharge Information Details Patient Name: [name], [name] Date of Service: 06/12/2015 2:45 PM Medical Record Number: [id number] Patient Account Number: [id number] Date of Birth/Sex: 1927-04-24 (82 y.o. Female) Treating RN: Carolyne Fiscal, Debi Primary Care Physician: Ria Bush Other  Clinician: Referring Physician: Ria Bush Treating Physician/Extender: Frann Rider in Treatment: 59 Encounter Discharge Information Items Discharge Pain Level: 0 Discharge Condition: Stable Ambulatory Status: [name] Discharge Destination: Home Private Transportation: Auto Accompanied By: son Schedule Follow-up Appointment: Yes Medication Reconciliation completed and Yes provided to Patient/Care Jendaya Gossett: Clinical Summary of Care: Electronic Signature(s) Signed: 06/12/2015 7:01:21 PM By: Alric Quan Entered By: Alric Quan on 06/12/2015 16:12:20 Joye, [name] -------------------------------------------------------------------------------- Lower Extremity Assessment Details Patient Name: [name], [name] Date of Service: 06/12/2015 2:45 PM Medical Record Number: [id number] Patient Account Number: [id number] Date of Birth/Sex: 08/03/1927 (82 y.o. Female) Treating RN: Carolyne Fiscal, Debi Primary Care Physician: Ria Bush Other Clinician: Referring Physician: Ria Bush Treating Physician/Extender: Frann Rider in Treatment: 43 Edema Assessment Assessed: [Left: No] [Right: No] E[Left: dema] [Right: :] Calf Left: Right: Point of Measurement: cm From Medial Instep cm 25.6 cm Ankle Left: Right: Point of Measurement: cm From Medial Instep cm 20 cm Vascular Assessment Pulses: Posterior Tibial Dorsalis Pedis Palpable: [Right:Yes] Extremity colors, hair growth, and conditions: Extremity Color: [Right:Mottled] Temperature of Extremity: [Right:Warm] Capillary Refill: [Right:< 3 seconds] Toe Nail Assessment Left: Right: Thick: Yes Discolored: Yes Deformed: No Improper Length and Hygiene: No Electronic Signature(s) Signed: 06/12/2015 7:01:21 PM By: Alric Quan Entered By: Alric Quan on 06/12/2015 15:37:56 Barnhill, [name]  ([id number]) -------------------------------------------------------------------------------- Multi Wound Chart Details Patient Name: [name], [name] Date of Service: 06/12/2015 2:45 PM Medical Record Number: [id number] Patient Account Number: [id number] Date of Birth/Sex: 1928-01-08 (82 y.o. Female) Treating RN: Carolyne Fiscal, Debi Primary Care Physician: Ria Bush Other Clinician: Referring Physician: Ria Bush Treating Physician/Extender: Frann Rider in Treatment: 43 Vital Signs Height(in): 58 Pulse(bpm): 72 Weight(lbs): 151 Blood Pressure 108/56 (mmHg): Body Mass Index(BMI): 32 Temperature(F): 97.8 Respiratory Rate 20 (breaths/min): Photos: [1:No Photos] [3:No Photos] [N/A:N/A] Wound Location: [1:Right Lower Leg - Medial Right Lower Leg -  Medial,] [3:Proximal] [N/A:N/A] Wounding Event: [1:Gradually Appeared] [3:Gradually Appeared] [N/A:N/A] Primary Etiology: [1:Venous Leg Ulcer] [3:Venous Leg Ulcer] [N/A:N/A] Comorbid History: [1:Cataracts, Anemia, Coronary Artery Disease, Coronary Artery Disease, Hypertension, Myocardial Hypertension, Myocardial Infarction, Type II Diabetes, Osteoarthritis Diabetes, Osteoarthritis] [3:Cataracts, Anemia, Infarction, Type II]  [N/A:N/A] Date Acquired: [1:04/04/2014] [3:05/15/2015] [N/A:N/A] Weeks of Treatment: [1:43] [3:4] [N/A:N/A] Wound Status: [1:Open] [3:Open] [N/A:N/A] Measurements L x W x D 1.7x0.7x0.1 [3:3x1x0.1] [N/A:N/A] (cm) Area (cm) : [1:0.935] [3:2.356] [N/A:N/A] Volume (cm) : [1:0.093] [3:0.236] [N/A:N/A] % Reduction in Area: [1:84.10%] [3:-368.40%] [N/A:N/A] % Reduction in Volume: 94.70% [3:-372.00%] [N/A:N/A] Classification: [1:Full Thickness Without Exposed Support Structures] [3:Partial Thickness] [N/A:N/A] HBO Classification: [1:Grade 1] [3:Grade 1] [N/A:N/A] Exudate Amount: [1:Large] [3:Large] [N/A:N/A] Exudate Type: [1:Serosanguineous] [3:Serous] [N/A:N/A] Exudate Color: [1:red, brown] [3:amber]  [N/A:N/A] Wound Margin: [1:Distinct, outline attached Flat and Intact] [N/A:N/A] Granulation Amount: [1:Large (67-100%)] [3:Medium (34-66%)] [N/A:N/A] Granulation Quality: [1:Red, Pink] [3:Red, Pink] [N/A:N/A] Necrotic Amount: [1:Small (1-33%)] [3:Medium (34-66%)] [N/A:N/A] Necrotic Tissue: Adherent Slough Eschar, Adherent Slough N/A Exposed Structures: Fascia: No Fascia: No N/A Fat: No Fat: No Tendon: No Tendon: No Muscle: No Muscle: No Joint: No Joint: No Bone: No Bone: No Limited to Skin Limited to Skin Breakdown Breakdown Epithelialization: None None N/A Periwound Skin Texture: Edema: Yes Edema: No N/A Excoriation: No Excoriation: No Induration: No Induration: No Callus: No Callus: No Crepitus: No Crepitus: No Fluctuance: No Fluctuance: No Friable: No Friable: No Rash: No Rash: No Scarring: No Scarring: No Periwound Skin Maceration: Yes Maceration: Yes N/A Moisture: Moist: Yes Moist: Yes Dry/Scaly: No Dry/Scaly: No Periwound Skin Color: Erythema: Yes Atrophie Blanche: No N/A Mottled: Yes Cyanosis: No Atrophie Blanche: No Ecchymosis: No Cyanosis: No Erythema: No Ecchymosis: No Hemosiderin Staining: No Hemosiderin Staining: No Mottled: No Pallor: No Pallor: No Rubor: No Rubor: No Erythema Location: Circumferential N/A N/A Temperature: No Abnormality No Abnormality N/A Tenderness on Yes Yes N/A Palpation: Wound Preparation: Ulcer Cleansing: Other: Ulcer Cleansing: Other: N/A soap and water soap and water Topical Anesthetic Topical Anesthetic Applied: Other: lidocaine Applied: None, Other: 4% lidocaine 4% Treatment Notes Electronic Signature(s) Signed: 06/12/2015 7:01:21 PM By: Alric Quan Entered By: Alric Quan on 06/12/2015 15:46:31 Spalla, [name] -------------------------------------------------------------------------------- [place] Details Patient Name: [name], [name] Date of  Service: 06/12/2015 2:45 PM Medical Record Number: [id number] Patient Account Number: [id number] Date of Birth/Sex: Dec 30, 1927 (82 y.o. Female) Treating RN: Carolyne Fiscal, Debi Primary Care Physician: Ria Bush Other Clinician: Referring Physician: Ria Bush Treating Physician/Extender: Frann Rider in Treatment: 53 Active Inactive Abuse / Safety / Falls / Self Care Management Nursing Diagnoses: Potential for falls Goals: Patient will remain injury free Date Initiated: 08/15/2014 Goal Status: Active Interventions: Assess fall risk on admission and as needed Notes: Nutrition Nursing Diagnoses: Impaired glucose control: actual or potential Goals: Patient/caregiver verbalizes understanding of need to maintain therapeutic glucose control per primary care physician Date Initiated: 08/15/2014 Goal Status: Active Interventions: Assess patient nutrition upon admission and as needed per policy Notes: Orientation to the Wound Care Program Nursing Diagnoses: Knowledge deficit related to the wound healing center program Goals: Patient/caregiver will verbalize understanding of the [place] MARYAH, FROHN ([id number]) Date Initiated: 08/15/2014 Goal Status: Active Interventions: Provide education on orientation to the wound center Notes: Venous Leg Ulcer Nursing Diagnoses: Actual venous Insuffiency (use after diagnosis is confirmed) Goals: Patient will maintain optimal edema control Date Initiated: 08/15/2014 Goal Status: Active Interventions: Compression as ordered Treatment Activities: Test ordered outside of clinic : 06/12/2015 Notes: Wound/Skin Impairment Nursing Diagnoses:  Impaired tissue integrity Goals: Patient/caregiver will verbalize understanding of skin care regimen Date Initiated: 08/15/2014 Goal Status: Active Interventions: Assess patient/caregiver ability to obtain necessary supplies Notes: Electronic  Signature(s) Signed: 06/12/2015 7:01:21 PM By: Alric Quan Entered By: Alric Quan on 06/12/2015 15:46:24 Cobbins, Lorel C. ([id number]) -------------------------------------------------------------------------------- Pain Assessment Details Patient Name: [name], [name] Date of Service: 06/12/2015 2:45 PM Medical Record Number: [id number] Patient Account Number: [id number] Date of Birth/Sex: 1928-03-14 (82 y.o. Female) Treating RN: Carolyne Fiscal, Debi Primary Care Physician: Ria Bush Other Clinician: Referring Physician: Ria Bush Treating Physician/Extender: Frann Rider in Treatment: 8 Active Problems Location of Pain Severity and Description of Pain Patient Has [name] No Site Locations Pain Management and Medication Current Pain Management: Electronic Signature(s) Signed: 06/12/2015 7:01:21 PM By: Alric Quan Entered By: Alric Quan on 06/12/2015 15:35:20 Menn, [name] -------------------------------------------------------------------------------- Patient/Caregiver Education Details Patient Name: [name], [name] Date of Service: 06/12/2015 2:45 PM Medical Record Number: [id number] Patient Account Number: [id number] Date of Birth/Gender: 1927/10/12 (82 y.o. Female) Treating RN: Carolyne Fiscal, Debi Primary Care Physician: Ria Bush Other Clinician: Referring Physician: Ria Bush Treating Physician/Extender: Frann Rider in Treatment: 27 Education Assessment Education Provided To: Patient Education Topics Provided Wound/Skin Impairment: Handouts: Other: do not get wrap wet Methods: Demonstration, Explain/Verbal Responses: State content correctly Electronic Signature(s) Signed: 06/12/2015 7:01:21 PM By: Alric Quan Entered By: Alric Quan on 06/12/2015 16:12:36 Rosello, [name] -------------------------------------------------------------------------------- Wound  Assessment Details Patient Name: [name], [name] Date of Service: 06/12/2015 2:45 PM Medical Record Number: [id number] Patient Account Number: [id number] Date of Birth/Sex: 02/05/1928 (82 y.o. Female) Treating RN: Carolyne Fiscal, Debi Primary Care Physician: Ria Bush Other Clinician: Referring Physician: Ria Bush Treating Physician/Extender: Frann Rider in Treatment: 85 Wound Status Wound Number: 1 Primary Venous Leg Ulcer Etiology: Wound Location: Right Lower Leg - Medial Wound Open Wounding Event: Gradually Appeared Status: Date Acquired: 04/04/2014 Comorbid Cataracts, Anemia, Coronary Artery Weeks Of Treatment: 43 History: Disease, Hypertension, Myocardial Clustered Wound: No Infarction, Type II Diabetes, Osteoarthritis Photos Photo Uploaded By: Alric Quan on 06/12/2015 17:40:00 Wound Measurements Length: (cm) 1.7 Width: (cm) 0.7 Depth: (cm) 0.1 Area: (cm) 0.935 Volume: (cm) 0.093 % Reduction in Area: 84.1% % Reduction in Volume: 94.7% Epithelialization: None Tunneling: No Undermining: No Wound Description Full Thickness Without Exposed Foul Odor Af Classification: Support Structures Diabetic Severity Grade 1 (Wagner): Wound Margin: Distinct, outline attached Exudate Amount: Large Exudate Type: Serosanguineous Exudate Color: red, brown ter Cleansing: No Wound Bed Shimer, Doxie C. ([id number]) Granulation Amount: Large (67-100%) Exposed Structure Granulation Quality: Red, Pink Fascia Exposed: No Necrotic Amount: Small (1-33%) Fat Layer Exposed: No Necrotic Quality: Adherent Slough Tendon Exposed: No Muscle Exposed: No Joint Exposed: No Bone Exposed: No Limited to Skin Breakdown Periwound Skin Texture Texture Color No Abnormalities Noted: No No Abnormalities Noted: No Callus: No Atrophie Blanche: No Crepitus: No Cyanosis: No Excoriation: No Ecchymosis: No Fluctuance: No Erythema: Yes Friable: No Erythema  Location: Circumferential Induration: No Hemosiderin Staining: No Localized Edema: Yes Mottled: Yes Rash: No Pallor: No Scarring: No Rubor: No Moisture Temperature / Pain No Abnormalities Noted: No Temperature: No Abnormality Dry / Scaly: No Tenderness on Palpation: Yes Maceration: Yes Moist: Yes Wound Preparation Ulcer Cleansing: Other: soap and water, Topical Anesthetic Applied: Other: lidocaine 4%, Treatment Notes Wound #1 (Right, Medial Lower Leg) 1. Cleansed with: Cleanse wound with antibacterial soap and water 2. Anesthetic Topical Lidocaine 4% cream to wound bed prior to debridement 3. Peri-wound Care: Skin Prep 4. Dressing Applied: Foam 5. Secondary  Dressing Applied Foam 7. Secured with Tape 2 Layer Lite Compression System - Right Lower Extremity [name], [name] ([id number]) Electronic Signature(s) Signed: 06/12/2015 7:01:21 PM By: Alric Quan Entered By: Alric Quan on 06/12/2015 15:46:13 Foote, [name] ([id number]) -------------------------------------------------------------------------------- Wound Assessment Details Patient Name: [name], [name] Date of Service: 06/12/2015 2:45 PM Medical Record Number: [id number] Patient Account Number: [id number] Date of Birth/Sex: 1927/12/18 (82 y.o. Female) Treating RN: Carolyne Fiscal, Debi Primary Care Physician: Ria Bush Other Clinician: Referring Physician: Ria Bush Treating Physician/Extender: Frann Rider in Treatment: 43 Wound Status Wound Number: 3 Primary Venous Leg Ulcer Etiology: Wound Location: Right Lower Leg - Medial, Proximal Wound Open Status: Wounding Event: Gradually Appeared Comorbid Cataracts, Anemia, Coronary Artery Date Acquired: 05/15/2015 History: Disease, Hypertension, Myocardial Weeks Of Treatment: 4 Infarction, Type II Diabetes, Clustered Wound: No Osteoarthritis Photos Photo Uploaded By: Alric Quan on 06/12/2015 17:40:00 Wound  Measurements Length: (cm) 3 Width: (cm) 1 Depth: (cm) 0.1 Area: (cm) 2.356 Volume: (cm) 0.236 % Reduction in Area: -368.4% % Reduction in Volume: -372% Epithelialization: None Tunneling: No Undermining: No Wound Description Classification: Partial Thickness Foul Odor Af Diabetic Severity (Wagner): Grade 1 Wound Margin: Flat and Intact Exudate Amount: Large Exudate Type: Serous Exudate Color: amber ter Cleansing: No Wound Bed Granulation Amount: Medium (34-66%) Exposed Structure Granulation Quality: Red, Pink Fascia Exposed: No Erck, Xitlally C. ([id number]) Necrotic Amount: Medium (34-66%) Fat Layer Exposed: No Necrotic Quality: Eschar, Adherent Slough Tendon Exposed: No Muscle Exposed: No Joint Exposed: No Bone Exposed: No Limited to Skin Breakdown Periwound Skin Texture Texture Color No Abnormalities Noted: No No Abnormalities Noted: No Callus: No Atrophie Blanche: No Crepitus: No Cyanosis: No Excoriation: No Ecchymosis: No Fluctuance: No Erythema: No Friable: No Hemosiderin Staining: No Induration: No Mottled: No Localized Edema: No Pallor: No Rash: No Rubor: No Scarring: No Temperature / Pain Moisture Temperature: No Abnormality No Abnormalities Noted: No Tenderness on Palpation: Yes Dry / Scaly: No Maceration: Yes Moist: Yes Wound Preparation Ulcer Cleansing: Other: soap and water, Topical Anesthetic Applied: None, Other: lidocaine 4%, Treatment Notes Wound #3 (Right, Proximal, Medial Lower Leg) 1. Cleansed with: Cleanse wound with antibacterial soap and water 2. Anesthetic Topical Lidocaine 4% cream to wound bed prior to debridement 3. Peri-wound Care: Skin Prep 4. Dressing Applied: Foam 5. Secondary Dressing Applied Foam 7. Secured with Tape 2 Layer Lite Compression System - Right Lower Extremity Electronic Signature(s) Signed: 06/12/2015 7:01:21 PM By: Vic Blackbird, [name] ([id number]) Entered By: Alric Quan on 06/12/2015 15:45:30 Cannata, [name] ([id number]) -------------------------------------------------------------------------------- Vitals Details Patient Name: Catalina, Wanette C. Date of Service: 06/12/2015 2:45 PM Medical Record Number: [id number] Patient Account Number: [id number] Date of Birth/Sex: 1927/08/12 (82 y.o. Female) Treating RN: Carolyne Fiscal, Debi Primary Care Physician: Ria Bush Other Clinician: Referring Physician: Ria Bush Treating Physician/Extender: Frann Rider in Treatment: 50 Vital Signs Time Taken: 15:35 Temperature (F): 97.8 Height (in): 58 Pulse (bpm): 72 Weight (lbs): 151 Respiratory Rate (breaths/min): 20 Body Mass Index (BMI): 31.6 Blood Pressure (mmHg): 108/56 Reference Range: 80 - 120 mg / dl Electronic Signature(s) Signed: 06/12/2015 7:01:21 PM By: Alric Quan Entered By: Alric Quan on 06/12/2015 15:37:25

## 2015-06-15 ENCOUNTER — Telehealth: Payer: Self-pay | Admitting: Family Medicine

## 2015-06-15 NOTE — Telephone Encounter (Signed)
Pt has appt with Dr Darnell Level on 06/16/15 at 2:45 pm.

## 2015-06-15 NOTE — Telephone Encounter (Signed)
Unfortunately I cannot see pt today. Can see tomorrow afternoon earliest.

## 2015-06-15 NOTE — Telephone Encounter (Signed)
Patient Name: [name]  DOB: [birth year]    Initial Comment Caller states has congestion in chest w/heavy cough, runny nose; 5 days; wants appt; now states wheezing   Nurse Assessment  Nurse: Leilani Merl, RN, Heather Date/Time (Eastern Time): 06/15/2015 9:34:58 AM  Confirm and document reason for call. If symptomatic, describe symptoms. You must click the next button to save text entered. ---Caller states has congestion in chest w/heavy cough, runny nose; 5 days; wants appt; now states wheezing  Has the patient traveled out of the country within the last 30 days? ---Not Applicable  Does the patient have any new or worsening symptoms? ---Yes  Will a triage be completed? ---Yes  Related visit to physician within the last 2 weeks? ---No  Does the PT have any chronic conditions? (i.e. diabetes, asthma, etc.) ---Yes  List chronic conditions. ---See MR  Is this a behavioral health or substance abuse call? ---No     Guidelines    Guideline Title Affirmed Question Affirmed Notes  Cough - Acute Non-Productive Wheezing is present    Final Disposition User   See Physician within 4 Hours (or PCP triage) [place], RN, [place] states that she will not see anyone but Dr. Darnell Level. and she wants him to work her in this morning for an appt.   Referrals  GO TO FACILITY REFUSED   Disagree/Comply: Comply

## 2015-06-16 ENCOUNTER — Ambulatory Visit (INDEPENDENT_AMBULATORY_CARE_PROVIDER_SITE_OTHER): Payer: Medicare Other | Admitting: Family Medicine

## 2015-06-16 ENCOUNTER — Encounter: Payer: Self-pay | Admitting: Family Medicine

## 2015-06-16 VITALS — BP 110/60 | HR 75 | Temp 97.9°F | Wt 147.5 lb

## 2015-06-16 DIAGNOSIS — I2510 Atherosclerotic heart disease of native coronary artery without angina pectoris: Secondary | ICD-10-CM | POA: Diagnosis not present

## 2015-06-16 DIAGNOSIS — J069 Acute upper respiratory infection, unspecified: Secondary | ICD-10-CM | POA: Insufficient documentation

## 2015-06-16 NOTE — Patient Instructions (Signed)
You have a viral upper respiratory infection. Antibiotics are not needed for this.  Viral infections usually take 7-10 days to resolve.  The cough can last a few weeks to go away. Continue corcedin. Push fluids and plenty of rest.  Call me if no improvement noted over next 2 days for antibiotic course. Call clinic with questions.  Good to see you today. I hope you start feeling better soon.

## 2015-06-16 NOTE — Assessment & Plan Note (Signed)
Reassured. No signs of bacterial infection. Continued supportive care discussed. Update if not improving with treatment. Call in 2d with update - if no better, low threshold to start abx course given age and comorbidities (amoxicillin 7d course - pt states tolerates this well despite augmentin allergy listed on chart).

## 2015-06-16 NOTE — Progress Notes (Signed)
BP 110/60 mmHg  Pulse 75  Temp(Src) 97.9 F (36.6 C) (Oral)  Wt 147 lb 8 oz (66.906 kg)  SpO2 96%   CC: URI  Subjective:    [name], female    DOB: 1927-10-27, 82 y.o.   MRN: [id number]  HPI: [name] is a 82 y.o. female presenting on 06/16/2015 for URI   1 wk h/o rhinorrhea, headache, cough, dyspnea and weakness. Endorses PNdrip. Cough productive of yellow sputum x1. Frontal headache and chest congestion.   No joint pain or body aches, fevers, or abdominal pain.  Taking corcedin which hasn't helped.  Friend Mendel Ryder is doing well.  Denies h/o allergic rhinitis.  She took 2 leftover amoxicillins.  Relevant past medical, surgical, family and social history reviewed and updated as indicated. Interim medical history since our last visit reviewed. Allergies and medications reviewed and updated. Current Outpatient Prescriptions on File Prior to Visit  Medication Sig  . acidophilus (RISAQUAD) CAPS capsule Take 1 capsule by mouth daily.  Marland Kitchen ALPRAZolam (XANAX) 1 MG tablet Take 0.5 mg by mouth 4 (four) times daily as needed for anxiety.   Marland Kitchen aspirin EC 81 MG tablet Take 81 mg by mouth daily.  Marland Kitchen Dexlansoprazole (DEXILANT) 30 MG capsule Take 1 capsule (30 mg total) by mouth daily.  . ferrous sulfate 325 (65 FE) MG tablet Take 1 tablet (325 mg total) by mouth daily with breakfast.  . furosemide (LASIX) 20 MG tablet Take 20 mg by mouth daily.  Marland Kitchen glucosamine-chondroitin 500-400 MG tablet Take 1 tablet by mouth daily.   Marland Kitchen HYDROcodone-acetaminophen (NORCO/VICODIN) 5-325 MG tablet Take 0.5-1 tablets by mouth every 6 (six) hours as needed for moderate pain.  Marland Kitchen loperamide (IMODIUM) 2 MG capsule Take 2 mg by mouth 4 (four) times daily as needed for diarrhea or loose stools.  . nitroGLYCERIN (NITROSTAT) 0.4 MG SL tablet Place 0.4 mg under the tongue every 5 (five) minutes as needed for chest pain.   Marland Kitchen nystatin-triamcinolone ointment (MYCOLOG) Apply 1 application topically 2  (two) times daily as needed (for itching).  . pioglitazone (ACTOS) 30 MG tablet Take 30 mg by mouth every evening.   . pravastatin (PRAVACHOL) 20 MG tablet Take 20 mg by mouth at bedtime.  . ramipril (ALTACE) 5 MG capsule Take 5 mg by mouth every evening.   . traZODone (DESYREL) 150 MG tablet Take 150 mg by mouth at bedtime.   . vitamin B-12 (CYANOCOBALAMIN) 1000 MCG tablet Take 1,000 mcg by mouth daily.  . vitamin E 400 UNIT capsule Take 400 Units by mouth daily.   No current facility-administered medications on file prior to visit.    Review of Systems Per HPI unless specifically indicated in ROS section     Objective:    BP 110/60 mmHg  Pulse 75  Temp(Src) 97.9 F (36.6 C) (Oral)  Wt 147 lb 8 oz (66.906 kg)  SpO2 96%  Wt Readings from Last 3 Encounters:  06/16/15 147 lb 8 oz (66.906 kg)  05/18/15 152 lb 8 oz (69.174 kg)  05/13/15 148 lb 6 oz (67.302 kg)    Physical Exam  Constitutional: She appears well-developed and well-nourished. No distress.  HENT:  Head: Normocephalic and atraumatic.  Right Ear: Hearing, external ear and ear canal normal.  Left Ear: Hearing, external ear and ear canal normal.  Nose: No mucosal edema or rhinorrhea. Right sinus exhibits no maxillary sinus tenderness and no frontal sinus tenderness. Left sinus exhibits no maxillary sinus tenderness and  no frontal sinus tenderness.  Mouth/Throat: Uvula is midline and mucous membranes are normal. Posterior oropharyngeal erythema (mild erythema) present. No oropharyngeal exudate, posterior oropharyngeal edema or tonsillar abscesses.  Cerumen in canals bilaterally Mildly injected nares bilaterally  Eyes: Conjunctivae and EOM are normal. Pupils are equal, round, and reactive to light. No scleral icterus.  Neck: Normal range of motion. Neck supple.  Cardiovascular: Normal rate, regular rhythm, normal heart sounds and intact distal pulses.   No murmur heard. Pulmonary/Chest: Effort normal and breath sounds  normal. No respiratory distress. She has no wheezes. She has no rales.  Lymphadenopathy:    She has no cervical adenopathy.  Skin: Skin is warm and dry. No rash noted.  Nursing note and vitals reviewed.    Assessment & Plan:   Problem List Items Addressed This Visit    Viral URI - Primary    Reassured. No signs of bacterial infection. Continued supportive care discussed. Update if not improving with treatment. Call in 2d with update - if no better, low threshold to start abx course given age and comorbidities (amoxicillin 7d course - pt states tolerates this well despite augmentin allergy listed on chart).           Follow up plan: Return if symptoms worsen or fail to improve.

## 2015-06-16 NOTE — Progress Notes (Signed)
Pre visit review using our clinic review tool, if applicable. No additional management support is needed unless otherwise documented below in the visit note. 

## 2015-06-18 ENCOUNTER — Telehealth: Payer: Self-pay | Admitting: *Deleted

## 2015-06-18 MED ORDER — AMOXICILLIN 500 MG PO CAPS: 500.0000 mg | ORAL_CAPSULE | Freq: Three times a day (TID) | ORAL | Status: DC

## 2015-06-18 NOTE — Telephone Encounter (Signed)
plz notify I've sent in amoxicillin 500mg  TID course x 7d to pharmacy. She stated she tolerated plain amox ok before.

## 2015-06-18 NOTE — Telephone Encounter (Signed)
Pt left voicemail at triage saying she is no better from her appt with Dr. Darnell Level. Pt said she still has a HA, a Lindenbaum runny nose, and she is still coughing and had a bad coughing spell last night, pt wants to know what to do

## 2015-06-18 NOTE — Telephone Encounter (Signed)
Patient notified and verbalized understanding. 

## 2015-06-19 ENCOUNTER — Encounter: Payer: Medicare Other | Admitting: Surgery

## 2015-06-19 DIAGNOSIS — L97811 Non-pressure chronic ulcer of other part of right lower leg limited to breakdown of skin: Secondary | ICD-10-CM | POA: Diagnosis not present

## 2015-06-19 DIAGNOSIS — I87331 Chronic venous hypertension (idiopathic) with ulcer and inflammation of right lower extremity: Secondary | ICD-10-CM | POA: Diagnosis not present

## 2015-06-19 DIAGNOSIS — Z951 Presence of aortocoronary bypass graft: Secondary | ICD-10-CM | POA: Diagnosis not present

## 2015-06-19 DIAGNOSIS — E11622 Type 2 diabetes mellitus with other skin ulcer: Secondary | ICD-10-CM | POA: Diagnosis not present

## 2015-06-19 DIAGNOSIS — I10 Essential (primary) hypertension: Secondary | ICD-10-CM | POA: Diagnosis not present

## 2015-06-19 DIAGNOSIS — E11610 Type 2 diabetes mellitus with diabetic neuropathic arthropathy: Secondary | ICD-10-CM | POA: Diagnosis not present

## 2015-06-19 DIAGNOSIS — S41112A Laceration without foreign body of left upper arm, initial encounter: Secondary | ICD-10-CM | POA: Diagnosis not present

## 2015-06-23 NOTE — Progress Notes (Signed)
DAYANIRA, BRENGLE ([id number]) Visit Report for 06/19/2015 Arrival Information Details Patient Name: Cauble, Ranyah C. Date of Service: 06/19/2015 2:45 PM Medical Record Number: [id number] Patient Account Number: [id number] Date of Birth/Sex: 02-Sep-1927 (82 y.o. Female) Treating RN: Montey Hora Primary Care Physician: Ria Bush Other Clinician: Referring Physician: Ria Bush Treating Physician/Extender: Frann Rider in Treatment: 22 Visit Information History Since Last Visit Added or deleted any medications: No Patient Arrived: Walker Any new allergies or adverse reactions: No Arrival Time: 14:50 Had a fall or experienced change in No Accompanied By: self activities of daily living that may affect Transfer Assistance: None risk of falls: Patient Identification Verified: Yes Signs or symptoms of abuse/neglect since last No Secondary Verification Process Yes visito Completed: Hospitalized since last visit: No Patient Requires Transmission- No Pain Present Now: No Based Precautions: Patient Has Alerts: Yes Patient Alerts: Patient on Blood Thinner Type II Diabetic 81 MG aspirin Electronic Signature(s) Signed: 06/22/2015 5:01:01 PM By: Montey Hora Entered By: Montey Hora on 06/19/2015 14:51:11 Haws, [name] ([id number]) -------------------------------------------------------------------------------- Encounter Discharge Information Details Patient Name: Filler, Shirlene C. Date of Service: 06/19/2015 2:45 PM Medical Record Number: [id number] Patient Account Number: [id number] Date of Birth/Sex: [birth year] (82 y.o. Female) Treating RN: Montey Hora Primary Care Physician: Ria Bush Other Clinician: Referring Physician: Ria Bush Treating Physician/Extender: Frann Rider in Treatment: 1 Encounter Discharge Information Items Discharge Pain Level: 0 Discharge Condition: Stable Ambulatory Status: Walker Discharge  Destination: Home Transportation: Private Auto Accompanied By: self Schedule Follow-up Appointment: Yes Medication Reconciliation completed No and provided to Patient/Care Abdulkareem Badolato: Provided on Clinical Summary of Care: 06/19/2015 Form Type Recipient Paper Patient LL Electronic Signature(s) Signed: 06/19/2015 4:49:14 PM By: Montey Hora Previous Signature: 06/19/2015 3:36:22 PM Version By: Sharon Mt Entered By: Montey Hora on 06/19/2015 16:49:14 Wingard, [name] ([id number]) -------------------------------------------------------------------------------- Lower Extremity Assessment Details Patient Name: Sather, Adalynne C. Date of Service: 06/19/2015 2:45 PM Medical Record Number: [id number] Patient Account Number: [id number] Date of Birth/Sex: 1927/05/20 (82 y.o. Female) Treating RN: Montey Hora Primary Care Physician: Ria Bush Other Clinician: Referring Physician: Ria Bush Treating Physician/Extender: Frann Rider in Treatment: 44 Edema Assessment Assessed: [Left: No] [Right: No] Edema: [Left: Ye] [Right: s] Vascular Assessment Pulses: Posterior Tibial Dorsalis Pedis Palpable: [Right:Yes] Extremity colors, hair growth, and conditions: Extremity Color: [Right:Mottled] Hair Growth on Extremity: [Right:No] Temperature of Extremity: [Right:Warm] Capillary Refill: [Right:< 3 seconds] Electronic Signature(s) Signed: 06/22/2015 5:01:01 PM By: Montey Hora Entered By: Montey Hora on 06/19/2015 15:21:23 Shamblin, Maura Loletha Grayer ([id number]) -------------------------------------------------------------------------------- Multi Wound Chart Details Patient Name: Bond, Gelsey C. Date of Service: 06/19/2015 2:45 PM Medical Record Number: [id number] Patient Account Number: [id number] Date of Birth/Sex: 03-17-1928 (82 y.o. Female) Treating RN: Montey Hora Primary Care Physician: Ria Bush Other Clinician: Referring Physician: Ria Bush Treating Physician/Extender: Frann Rider in Treatment: 44 Vital Signs Height(in): 58 Pulse(bpm): 68 Weight(lbs): 151 Blood Pressure 130/58 (mmHg): Body Mass Index(BMI): 32 Temperature(F): 98.0 Respiratory Rate 18 (breaths/min): Photos: [1:No Photos] [3:No Photos] [N/A:N/A] Wound Location: [1:Right Lower Leg - Medial Right Lower Leg - Medial,] [3:Proximal] [N/A:N/A] Wounding Event: [1:Gradually Appeared] [3:Gradually Appeared] [N/A:N/A] Primary Etiology: [1:Venous Leg Ulcer] [3:Venous Leg Ulcer] [N/A:N/A] Comorbid History: [1:Cataracts, Anemia, Coronary Artery Disease, Coronary Artery Disease, Hypertension, Myocardial Hypertension, Myocardial Infarction, Type II Diabetes, Osteoarthritis Diabetes, Osteoarthritis] [3:Cataracts, Anemia, Infarction, Type II]  [N/A:N/A] Date Acquired: [1:04/04/2014] [3:05/15/2015] [N/A:N/A] Weeks of Treatment: [1:44] [3:5] [N/A:N/A] Wound Status: [1:Open] [3:Open] [N/A:N/A] Measurements L x W x D 1.7x0.7x0.1 [3:0.5x0.5x0.1] [N/A:N/A] (cm) Area (cm) : [1:0.935] [  3:0.196] [N/A:N/A] Volume (cm) : [1:0.093] [3:0.02] [N/A:N/A] % Reduction in Area: [1:84.10%] [3:61.00%] [N/A:N/A] % Reduction in Volume: 94.70% [3:60.00%] [N/A:N/A] Classification: [1:Full Thickness Without Exposed Support Structures] [3:Partial Thickness] [N/A:N/A] HBO Classification: [1:Grade 1] [3:Grade 1] [N/A:N/A] Exudate Amount: [1:Large] [3:Large] [N/A:N/A] Exudate Type: [1:Serosanguineous] [3:Serous] [N/A:N/A] Exudate Color: [1:red, brown] [3:amber] [N/A:N/A] Wound Margin: [1:Distinct, outline attached Flat and Intact] [N/A:N/A] Granulation Amount: [1:Large (67-100%)] [3:Large (67-100%)] [N/A:N/A] Granulation Quality: [1:Red, Pink] [3:Red, Pink] [N/A:N/A] Necrotic Amount: [1:None Present (0%)] [3:None Present (0%)] [N/A:N/A] Exposed Structures: Fascia: No Fascia: No N/A Fat: No Fat: No Tendon: No Tendon: No Muscle: No Muscle: No Joint: No Joint: No Bone:  No Bone: No Limited to Skin Limited to Skin Breakdown Breakdown Epithelialization: None None N/A Debridement: Debridement [id number] Debridement [id number] N/A 11047) 11047) Time-Out Taken: Yes Yes N/A Pain Control: Lidocaine 4% Topical Lidocaine 4% Topical N/A Solution Solution Tissue Debrided: Fibrin/Slough, Fibrin/Slough, N/A Subcutaneous Subcutaneous Level: Skin/Subcutaneous Skin/Subcutaneous N/A Tissue Tissue Debridement Area (sq 1.19 0.25 N/A cm): Instrument: Forceps Forceps N/A Bleeding: None None N/A Procedural Pain: 0 0 N/A Post Procedural Pain: 0 0 N/A Debridement Treatment Procedure was tolerated Procedure was tolerated N/A Response: well well Post Debridement 1.7x0.7x0.1 0.5x0.5x0.1 N/A Measurements L x W x D (cm) Post Debridement 0.093 0.02 N/A Volume: (cm) Periwound Skin Texture: Edema: Yes Edema: No N/A Excoriation: No Excoriation: No Induration: No Induration: No Callus: No Callus: No Crepitus: No Crepitus: No Fluctuance: No Fluctuance: No Friable: No Friable: No Rash: No Rash: No Scarring: No Scarring: No Periwound Skin Maceration: Yes Maceration: Yes N/A Moisture: Moist: Yes Moist: Yes Dry/Scaly: No Dry/Scaly: No Periwound Skin Color: Erythema: Yes Atrophie Blanche: No N/A Mottled: Yes Cyanosis: No Atrophie Blanche: No Ecchymosis: No Cyanosis: No Erythema: No Ecchymosis: No Hemosiderin Staining: No Hemosiderin Staining: No Mottled: No Wollenberg, Evangelia C. ([id number]) Pallor: No Pallor: No Rubor: No Rubor: No Erythema Location: Circumferential N/A N/A Temperature: No Abnormality No Abnormality N/A Tenderness on Yes Yes N/A Palpation: Wound Preparation: Ulcer Cleansing: Other: Ulcer Cleansing: Other: N/A soap and water soap and water Topical Anesthetic Topical Anesthetic Applied: Other: lidocaine Applied: None, Other: 4% lidocaine 4% Procedures Performed: Debridement Debridement N/A Treatment Notes Electronic  Signature(s) Signed: 06/22/2015 5:01:01 PM By: Montey Hora Entered By: Montey Hora on 06/19/2015 15:22:22 Highbaugh, [name] ([id number]) -------------------------------------------------------------------------------- Newsoms Details Patient Name: Meyn, Soila C. Date of Service: 06/19/2015 2:45 PM Medical Record Number: [id number] Patient Account Number: [id number] Date of Birth/Sex: 01-12-1928 (82 y.o. Female) Treating RN: Montey Hora Primary Care Physician: Ria Bush Other Clinician: Referring Physician: Ria Bush Treating Physician/Extender: Frann Rider in Treatment: 67 Active Inactive Abuse / Safety / Falls / Self Care Management Nursing Diagnoses: Potential for falls Goals: Patient will remain injury free Date Initiated: 08/15/2014 Goal Status: Active Interventions: Assess fall risk on admission and as needed Notes: Nutrition Nursing Diagnoses: Impaired glucose control: actual or potential Goals: Patient/caregiver verbalizes understanding of need to maintain therapeutic glucose control per primary care physician Date Initiated: 08/15/2014 Goal Status: Active Interventions: Assess patient nutrition upon admission and as needed per policy Notes: Orientation to the Wound Care Program Nursing Diagnoses: Knowledge deficit related to the wound healing center program Goals: Patient/caregiver will verbalize understanding of the [facility] MERCADES, KOMM ([id number]) Date Initiated: 08/15/2014 Goal Status: Active Interventions: Provide education on orientation to the wound center Notes: Venous Leg Ulcer Nursing Diagnoses: Actual venous Insuffiency (use after diagnosis is confirmed) Goals: Patient will maintain optimal edema control Date Initiated: 08/15/2014 Goal  Status: Active Interventions: Compression as ordered Treatment Activities: Test ordered outside of clinic :  06/19/2015 Notes: Wound/Skin Impairment Nursing Diagnoses: Impaired tissue integrity Goals: Patient/caregiver will verbalize understanding of skin care regimen Date Initiated: 08/15/2014 Goal Status: Active Interventions: Assess patient/caregiver ability to obtain necessary supplies Notes: Electronic Signature(s) Signed: 06/22/2015 5:01:01 PM By: Montey Hora Entered By: Montey Hora on 06/19/2015 15:22:12 Vanderbeck, [name] ([id number]) -------------------------------------------------------------------------------- Patient/Caregiver Education Details Patient Name: Bastedo, Marea C. Date of Service: 06/19/2015 2:45 PM Medical Record Number: [id number] Patient Account Number: [id number] Date of Birth/Gender: 12-24-1927 (82 y.o. Female) Treating RN: Montey Hora Primary Care Physician: Ria Bush Other Clinician: Referring Physician: Ria Bush Treating Physician/Extender: Frann Rider in Treatment: 49 Education Assessment Education Provided To: Patient Education Topics Provided Venous: Handouts: Other: continue leg elevation Methods: Explain/Verbal Responses: State content correctly Electronic Signature(s) Signed: 06/19/2015 4:49:31 PM By: Montey Hora Entered By: Montey Hora on 06/19/2015 16:49:31 Munley, [name] ([id number]) -------------------------------------------------------------------------------- Wound Assessment Details Patient Name: Weissinger, Brittney C. Date of Service: 06/19/2015 2:45 PM Medical Record Number: [id number] Patient Account Number: [id number] Date of Birth/Sex: 1927-12-24 (82 y.o. Female) Treating RN: Montey Hora Primary Care Physician: Ria Bush Other Clinician: Referring Physician: Ria Bush Treating Physician/Extender: Frann Rider in Treatment: 74 Wound Status Wound Number: 1 Primary Venous Leg Ulcer Etiology: Wound Location: Right Lower Leg - Medial Wound Open Wounding Event:  Gradually Appeared Status: Date Acquired: 04/04/2014 Comorbid Cataracts, Anemia, Coronary Artery Weeks Of Treatment: 44 History: Disease, Hypertension, Myocardial Clustered Wound: No Infarction, Type II Diabetes, Osteoarthritis Photos Photo Uploaded By: Montey Hora on 06/19/2015 16:50:57 Wound Measurements Length: (cm) 1.7 Width: (cm) 0.7 Depth: (cm) 0.1 Area: (cm) 0.935 Volume: (cm) 0.093 % Reduction in Area: 84.1% % Reduction in Volume: 94.7% Epithelialization: None Tunneling: No Undermining: No Wound Description Full Thickness Without Exposed Foul Odor Af Classification: Support Structures Diabetic Severity Grade 1 (Wagner): Wound Margin: Distinct, outline attached Exudate Amount: Large Exudate Type: Serosanguineous Exudate Color: red, brown ter Cleansing: No Wound Bed Hupfer, Anora C. ([id number]) Granulation Amount: Large (67-100%) Exposed Structure Granulation Quality: Red, Pink Fascia Exposed: No Necrotic Amount: None Present (0%) Fat Layer Exposed: No Tendon Exposed: No Muscle Exposed: No Joint Exposed: No Bone Exposed: No Limited to Skin Breakdown Periwound Skin Texture Texture Color No Abnormalities Noted: No No Abnormalities Noted: No Callus: No Atrophie Blanche: No Crepitus: No Cyanosis: No Excoriation: No Ecchymosis: No Fluctuance: No Erythema: Yes Friable: No Erythema Location: Circumferential Induration: No Hemosiderin Staining: No Localized Edema: Yes Mottled: Yes Rash: No Pallor: No Scarring: No Rubor: No Moisture Temperature / Pain No Abnormalities Noted: No Temperature: No Abnormality Dry / Scaly: No Tenderness on Palpation: Yes Maceration: Yes Moist: Yes Wound Preparation Ulcer Cleansing: Other: soap and water, Topical Anesthetic Applied: Other: lidocaine 4%, Treatment Notes Wound #1 (Right, Medial Lower Leg) 1. Cleansed with: Cleanse wound with antibacterial soap and water 2. Anesthetic Topical Lidocaine  4% cream to wound bed prior to debridement 4. Dressing Applied: Foam 7. Secured with 2 Layer Compression System - Right Lower Extremity Notes foam to heel for comfort Electronic Signature(s) Signed: 06/22/2015 5:01:01 PM By: Ples Specter, [name] ([id number]) Entered By: Montey Hora on 06/19/2015 15:21:43 Bielinski, [name] ([id number]) -------------------------------------------------------------------------------- Wound Assessment Details Patient Name: Finder, Fabian C. Date of Service: 06/19/2015 2:45 PM Medical Record Number: [id number] Patient Account Number: [id number] Date of Birth/Sex: April 27, 1927 (82 y.o. Female) Treating RN: Montey Hora Primary Care Physician: Ria Bush Other Clinician: Referring Physician: Ria Bush Treating Physician/Extender: Christin Fudge  Weeks in Treatment: 44 Wound Status Wound Number: 3 Primary Venous Leg Ulcer Etiology: Wound Location: Right Lower Leg - Medial, Proximal Wound Open Status: Wounding Event: Gradually Appeared Comorbid Cataracts, Anemia, Coronary Artery Date Acquired: 05/15/2015 History: Disease, Hypertension, Myocardial Weeks Of Treatment: 5 Infarction, Type II Diabetes, Clustered Wound: No Osteoarthritis Photos Photo Uploaded By: Montey Hora on 06/19/2015 16:50:57 Wound Measurements Length: (cm) 0.5 Width: (cm) 0.5 Depth: (cm) 0.1 Area: (cm) 0.196 Volume: (cm) 0.02 % Reduction in Area: 61% % Reduction in Volume: 60% Epithelialization: None Tunneling: No Undermining: No Wound Description Classification: Partial Thickness Foul Odor A Diabetic Severity (Wagner): Grade 1 Wound Margin: Flat and Intact Exudate Amount: Large Exudate Type: Serous Exudate Color: amber fter Cleansing: No Wound Bed Granulation Amount: Large (67-100%) Exposed Structure Granulation Quality: Red, Pink Fascia Exposed: No Goudeau, Starkeisha C. ([id number]) Necrotic Amount: None Present (0%) Fat Layer  Exposed: No Tendon Exposed: No Muscle Exposed: No Joint Exposed: No Bone Exposed: No Limited to Skin Breakdown Periwound Skin Texture Texture Color No Abnormalities Noted: No No Abnormalities Noted: No Callus: No Atrophie Blanche: No Crepitus: No Cyanosis: No Excoriation: No Ecchymosis: No Fluctuance: No Erythema: No Friable: No Hemosiderin Staining: No Induration: No Mottled: No Localized Edema: No Pallor: No Rash: No Rubor: No Scarring: No Temperature / Pain Moisture Temperature: No Abnormality No Abnormalities Noted: No Tenderness on Palpation: Yes Dry / Scaly: No Maceration: Yes Moist: Yes Wound Preparation Ulcer Cleansing: Other: soap and water, Topical Anesthetic Applied: None, Other: lidocaine 4%, Treatment Notes Wound #3 (Right, Proximal, Medial Lower Leg) 1. Cleansed with: Cleanse wound with antibacterial soap and water 2. Anesthetic Topical Lidocaine 4% cream to wound bed prior to debridement 4. Dressing Applied: Foam 7. Secured with 2 Layer Compression System - Right Lower Extremity Notes foam to heel for comfort Electronic Signature(s) Signed: 06/22/2015 5:01:01 PM By: Montey Hora Entered By: Montey Hora on 06/19/2015 15:21:59 Delashmit, [name] ([id number]) [place]ett, Niemah Loletha Grayer ([id number]) -------------------------------------------------------------------------------- Vitals Details Patient Name: Dario, Edilia C. Date of Service: 06/19/2015 2:45 PM Medical Record Number: [id number] Patient Account Number: [id number] Date of Birth/Sex: [birth year] (82 y.o. Female) Treating RN: Montey Hora Primary Care Physician: Ria Bush Other Clinician: Referring Physician: Ria Bush Treating Physician/Extender: Frann Rider in Treatment: 44 Vital Signs Time Taken: 14:52 Temperature (F): 98.0 Height (in): 58 Pulse (bpm): 68 Weight (lbs): 151 Respiratory Rate (breaths/min): 18 Body Mass Index (BMI): 31.6 Blood Pressure  (mmHg): 130/58 Reference Range: 80 - 120 mg / dl Electronic Signature(s) Signed: 06/22/2015 5:01:01 PM By: Montey Hora Entered By: Montey Hora on 06/19/2015 14:54:07

## 2015-06-23 NOTE — Progress Notes (Signed)
LAMAYAH, KROON ([id number]) Visit Report for 06/19/2015 Chief Complaint Document Details Patient Name: [name], [name] Date of Service: 06/19/2015 2:45 PM Medical Record Number: [id number] Patient Account Number: [id number] Date of Birth/Sex: 02-21-1928 (82 y.o. Female) Treating RN: Montey Hora Primary Care Physician: Ria Bush Other Clinician: Referring Physician: Ria Bush Treating Physician/Extender: Frann Rider in Treatment: 35 Information Obtained from: Patient Chief Complaint Patient presents for treatment of an open ulcer due to venous insufficiency. 82 year old patient who is known to me from the [facility] wound care center now comes in follow up here for a chronic venous ulceration of her right lower extremity which she's had for about 6 months. Electronic Signature(s) Signed: 06/19/2015 3:26:21 PM By: Christin Fudge MD, FACS Entered By: Christin Fudge on 06/19/2015 15:26:21 [name] ([id number]) -------------------------------------------------------------------------------- Debridement Details Patient Name: [name], [name] Date of Service: 06/19/2015 2:45 PM Medical Record Number: [id number] Patient Account Number: [id number] Date of Birth/Sex: 1927/04/17 (82 y.o. Female) Treating RN: Montey Hora Primary Care Physician: Ria Bush Other Clinician: Referring Physician: Ria Bush Treating Physician/Extender: Frann Rider in Treatment: 44 Debridement Performed for Wound #1 Right,Medial Lower Leg Assessment: Performed By: Physician Christin Fudge, MD Debridement: Open Wound/Selective Debridement Selective Description: Pre-procedure Yes Verification/Time Out Taken: Start Time: 15:17 Pain Control: Lidocaine 4% Topical Solution Level: Non-Viable Tissue Total Area Debrided (L x 1.7 (cm) x 0.7 (cm) = 1.19 (cm) W): Tissue and other Viable, Non-Viable, Eschar, Exudate, Fibrin/Slough material  debrided: Instrument: Forceps Bleeding: None End Time: 15:19 Procedural Pain: 0 Post Procedural Pain: 0 Response to Treatment: Procedure was tolerated well Post Debridement Measurements of Total Wound Length: (cm) 1.7 Width: (cm) 0.7 Depth: (cm) 0.1 Volume: (cm) 0.093 Post Procedure Diagnosis Same as Pre-procedure Electronic Signature(s) Signed: 06/19/2015 3:25:52 PM By: Christin Fudge MD, FACS Signed: 06/22/2015 5:01:01 PM By: Montey Hora Entered By: Christin Fudge on 06/19/2015 15:25:52 [name], [name] ([id number]) -------------------------------------------------------------------------------- Debridement Details Patient Name: [name], [name] Date of Service: 06/19/2015 2:45 PM Medical Record Number: [id number] Patient Account Number: [id number] Date of Birth/Sex: December 08, 1927 (82 y.o. Female) Treating RN: Montey Hora Primary Care Physician: Ria Bush Other Clinician: Referring Physician: Ria Bush Treating Physician/Extender: Frann Rider in Treatment: 44 Debridement Performed for Wound #3 Right,Proximal,Medial Lower Leg Assessment: Performed By: Physician Christin Fudge, MD Debridement: Open Wound/Selective Debridement Selective Description: Pre-procedure Yes Verification/Time Out Taken: Start Time: 15:16 Pain Control: Lidocaine 4% Topical Solution Level: Non-Viable Tissue Total Area Debrided (L x 0.5 (cm) x 0.5 (cm) = 0.25 (cm) W): Tissue and other Non-Viable, Eschar, Exudate, Fibrin/Slough material debrided: Instrument: Forceps Bleeding: None End Time: 15:17 Procedural Pain: 0 Post Procedural Pain: 0 Response to Treatment: Procedure was tolerated well Post Debridement Measurements of Total Wound Length: (cm) 0.5 Width: (cm) 0.5 Depth: (cm) 0.1 Volume: (cm) 0.02 Post Procedure Diagnosis Same as Pre-procedure Electronic Signature(s) Signed: 06/19/2015 3:26:15 PM By: Christin Fudge MD, FACS Signed: 06/22/2015 5:01:01 PM By:  Montey Hora Entered By: Christin Fudge on 06/19/2015 15:26:15 [name], [name] ([id number]) -------------------------------------------------------------------------------- HPI Details Patient Name: [name], [name] Date of Service: 06/19/2015 2:45 PM Medical Record Number: [id number] Patient Account Number: [id number] Date of Birth/Sex: 05/27/1927 (82 y.o. Female) Treating RN: Montey Hora Primary Care Physician: Ria Bush Other Clinician: Referring Physician: Ria Bush Treating Physician/Extender: Frann Rider in Treatment: 56 History of Present Illness Location: ulcer in the right lower extremity for several months Quality: Patient reports experiencing a dull pain to affected area(s). Severity: Patient states wound (s) are getting better. Duration: Patient  has had the wound for > 3 months prior to seeking treatment at the wound center Timing: Pain in wound is Intermittent (comes and goes Context: The wound appeared gradually over time Modifying Factors: Consults to this date include: vascular surgery for the veins in December 2015 and she's been seeing the wound care center at [facility] for several months. Associated Signs and Symptoms: Patient reports having difficulty standing for long periods. HPI Description: This is an 82 yrs old female we have been following since the beginning of January for a wound on the right medial ankle. She had previously had venous ablation of the greater saphenous vein by Dr. Kellie Simmering. She came to [place] with erythema of the right leg and subsequently is opened in the medial aspect of the right ankle. She has not been very compliant with compression. We ordered her at juxta lite stocking, however she apparently has not been able to wear it. The area on the medial aspect of her left ankle has much less induration and erythema. The edema is better controlled. No evidence of infection is noted. 07/18/14; no major change in the  condition of this wound. There is still surface slough and probably some drainage. 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 All other veins showed no evidence of DVT, superficial venous thrombosis or incompetence. however she does have manifestations of chronic venous hypertension and I believe we will change her dressing over to Prisma and a Profore light dressing. she sees her primary care doctor regularly and her last hemoglobin A1c has been around 7%.her other comorbidities are hypertension, CABG in 1998, peptic ulcer disease, history of echo with a ejection fraction of 55-60% and grade 1 diastolic dysfunction. past surgical history include coronary artery bypass graft in 1998, cholecystectomy, appendectomy, hand surgery, endovenous ablation of the saphenous vein with laser by Dr. Kellie Simmering in December 2015. 08/29/2014 - she complains of increased calf pain over the past week. removed her compression bandage and pain improved. no fever or chills. Mild clear drainage. 09/04/2014 -- she has not been compliant and has not done a dressing change as we had ordered last week. She always complains and does not keep her wrap on and this past week she has done the same. She has completed a course of doxycycline. 09/25/2014 -- she has tolerated her dressing all week and has not removed it. She says it's hurting a lot today and would not like any debridement. We will do so as per her wishes. she did get a appointment with the vascular surgeon and sees him on 6/28. [name], [name] 10/03/2014 - She was seen by Dr. Tinnie Gens 09/30/2014. Assessment: I reviewed the previous venous duplex exam study performed in September 2015 which reveals no options for treating this venous ulcer. There is no significant superficial venous reflux to treat with laser ablation.Most  recent ABI was 0.91 in the right foot with palpable dorsalis pedis pulse at 3+ Plan: patient needs to return to the [place] wound center and be treated by Dr. Con Memos. No arterial or venous options available from vascular standpoint or treatment of right leg stasis ulcer.Continued dressing changes and compression is the treatment of choice 10/16/2014 -- today will be her first application of Apligraf. 10/23/2014 -- we are going to be able to apply her second day of Apligraf next week. She is complaining of some  tenderness in the region of Achilles tendon and this is possibly due to her wrap. 11/06/2014 -- she did not have Apligraf last week because the wound was not looking good but she says she is feeling much better now. 11/13/2014 -- she has been doing well this week and she is here for her second Apligraf. 11/27/2014 -- she is here for her third application of Apligraf 12/12/2014 -- she is here for her fourth application of Apligraf. Also during these past 3 days she had an injury to her left forearm which she caught against the edge of her desk and has a lacerated wound which has gone down to the subcutaneous tissue. 12/26/2014 -- she is here for a last/fifth application of Apligraf. 04/10/2015 -- the patient is here for her first application of Puraply. 04/17/2015 -- the patient is here for her second application of Puraply. 04/24/2015 -- her Puraply was not ordered for today and hence he would use a collagen dressing locally. 05/01/2015 -- the patient is here for her third application of Puraply. 05/08/2015 -- the patient has been doing fine and overall has shown much improvement and is here for a routine visit 05/15/2015 -- she was admitted to the hospital with viral gastroenteritis and acute renal failure with dehydration and has been on supportive care since February 7. She was discharged today and has come for her wound care. Details of her hospital admission and therapy  reviewed. Electronic Signature(s) Signed: 06/19/2015 3:26:27 PM By: Christin Fudge MD, FACS Entered By: Christin Fudge on 06/19/2015 15:26:26 [name], [name] -------------------------------------------------------------------------------- Physical Exam Details Patient Name: Mainwaring, Alayha C. Date of Service: 06/19/2015 2:45 PM Medical Record Number: [id number] Patient Account Number: [id number] Date of Birth/Sex: 1927/04/30 (82 y.o. Female) Treating RN: Montey Hora Primary Care Physician: Ria Bush Other Clinician: Referring Physician: Ria Bush Treating Physician/Extender: Frann Rider in Treatment: 44 Constitutional . Pulse regular. Respirations normal and unlabored. Afebrile. . Eyes Nonicteric. Reactive to light. Ears, Nose, Mouth, and Throat Lips, teeth, and gums WNL.Marland Kitchen Moist mucosa without lesions. Neck supple and nontender. No palpable supraclavicular or cervical adenopathy. Normal sized without goiter. Respiratory WNL. No retractions.. Cardiovascular Pedal Pulses WNL. No clubbing, cyanosis or edema. Lymphatic No adneopathy. No adenopathy. No adenopathy. Musculoskeletal Adexa without tenderness or enlargement.. Digits and nails w/o clubbing, cyanosis, infection, petechiae, ischemia, or inflammatory conditions.. Integumentary (Hair, Skin) No suspicious lesions. No crepitus or fluctuance. No peri-wound warmth or erythema. No masses.Marland Kitchen Psychiatric Judgement and insight Intact.. No evidence of depression, anxiety, or agitation.. Notes the patient had very good control of her edema and the wounds have a significant eschar and after gently removing it that is excellent resolution of her ulceration. Electronic Signature(s) Signed: 06/19/2015 3:27:46 PM By: Christin Fudge MD, FACS Entered By: Christin Fudge on 06/19/2015 15:27:46 [name], [name]  ([id number]) -------------------------------------------------------------------------------- Physician Orders Details Patient Name: [name], [name] Date of Service: 06/19/2015 2:45 PM Medical Record Number: [id number] Patient Account Number: [id number] Date of Birth/Sex: [birth year] (82 y.o. Female) Treating RN: Montey Hora Primary Care Physician: Ria Bush Other Clinician: Referring Physician: Ria Bush Treating Physician/Extender: Frann Rider in Treatment: 72 Verbal / Phone Orders: Yes Clinician: Montey Hora Read Back and Verified: Yes Diagnosis Coding Wound Cleansing Wound #1 Right,Medial Lower Leg o Cleanse wound with mild soap and water Anesthetic Wound #1 Right,Medial Lower Leg o Topical Lidocaine 4% cream applied to wound bed prior to debridement Skin Barriers/Peri-Wound Care Wound #1 Right,Medial Lower Leg o Skin Prep Secondary Dressing Wound #1  Right,Medial Lower Leg o Foam - cutimed Dressing Change Frequency Wound #1 Right,Medial Lower Leg o Change dressing every week Follow-up Appointments Wound #1 Right,Medial Lower Leg o Return Appointment in 1 week. Edema Control Wound #1 Right,Medial Lower Leg o 2 Layer Lite Compression System - Right Lower Extremity o Elevate legs to the level of the heart and pump ankles as often as possible Electronic Signature(s) Signed: 06/19/2015 3:37:46 PM By: Christin Fudge MD, FACS Signed: 06/22/2015 5:01:01 PM By: Montey Hora Entered By: Montey Hora on 06/19/2015 15:22:48 [name], [name] [name], [name] -------------------------------------------------------------------------------- Problem List Details Patient Name: [name], [name] Date of Service: 06/19/2015 2:45 PM Medical Record Number: [id number] Patient Account Number: [id number] Date of Birth/Sex: November 10, 1927 (82 y.o. Female) Treating RN: Montey Hora Primary Care Physician: Ria Bush Other Clinician: Referring Physician: Ria Bush Treating Physician/Extender: Frann Rider in Treatment: 49 Active Problems ICD-10 Encounter Code Description Active Date Diagnosis E11.622 Type 2 diabetes mellitus with other skin ulcer 08/15/2014 Yes E11.610 Type 2 diabetes mellitus with diabetic neuropathic 08/15/2014 Yes arthropathy I87.331 Chronic venous hypertension (idiopathic) with ulcer and 08/15/2014 Yes inflammation of right lower extremity S41.112A Laceration without foreign body of left upper arm, initial 12/12/2014 Yes encounter Inactive Problems Resolved Problems ICD-10 Code Description Active Date Resolved Date L03.115 Cellulitis of right lower limb 08/29/2014 08/29/2014 Electronic Signature(s) Signed: 06/19/2015 3:25:24 PM By: Christin Fudge MD, FACS Entered By: Christin Fudge on 06/19/2015 15:25:24 Folmer, [facility]el[facility] Loletha [name] ([id number]) -------------------------------------------------------------------------------- Progress Note Details Patient Name: [name], Joeli C. Date of Service: 06/19/2015 2:45 PM Medical Record Number: [id number] Patient Account Number: [id number] Date of Birth/Sex: 01/12/1928 (82 y.o. Female) Treating RN: Montey Hora Primary Care Physician: Ria Bush Other Clinician: Referring Physician: Ria Bush Treating Physician/Extender: Frann Rider in Treatment: 60 Subjective Chief Complaint Information obtained from Patient Patient presents for treatment of an open ulcer due to venous insufficiency. 82 year old patient who is known to me from the [facility] wound care center now comes in follow up here for a chronic venous ulceration of her right lower extremity which she's had for about 6 months. History of Present Illness (HPI) The following HPI elements were documented for the patient's wound: Location: ulcer in the right lower extremity for several months Quality: Patient reports experiencing a dull pain to  affected area(s). Severity: Patient states wound (s) are getting better. Duration: Patient has had the wound for > 3 months prior to seeking treatment at the wound center Timing: Pain in wound is Intermittent (comes and goes Context: The wound appeared gradually over time Modifying Factors: Consults to this date include: vascular surgery for the veins in December 2015 and she's been seeing the wound care center at [facility] for several months. Associated Signs and Symptoms: Patient reports having difficulty standing for long periods. This is an 82 yrs old female we have been following since the beginning of January for a wound on the right medial ankle. She had previously had venous ablation of the greater saphenous vein by Dr. Kellie Simmering. She came to [place] with erythema of the right leg and subsequently is opened in the medial aspect of the right ankle. She has not been very compliant with compression. We ordered her at juxta lite stocking, however she apparently has not been able to wear it. The area on the medial aspect of her left ankle has much less induration and erythema. The edema is better controlled. No evidence of infection is noted. 07/18/14; no major change in the condition of this  wound. There is still surface slough and probably some drainage. 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 All other veins showed no evidence of DVT, superficial venous thrombosis or incompetence. however she does have manifestations of chronic venous hypertension and I believe we will change her dressing over to Prisma and a Profore light dressing. she sees her primary care doctor regularly and her last hemoglobin A1c has been around 7%.her other comorbidities are hypertension, CABG in 1998, peptic ulcer disease, history of echo with a ejection fraction of 55-60% and grade 1 diastolic  dysfunction. past surgical history include coronary artery bypass graft in 1998, cholecystectomy, appendectomy, hand surgery, endovenous ablation of the saphenous vein with laser by Dr. Kellie Simmering in December 2015. SYRIA, AMEDEO ([id number]) 08/29/2014 - she complains of increased calf pain over the past week. removed her compression bandage and pain improved. no fever or chills. Mild clear drainage. 09/04/2014 -- she has not been compliant and has not done a dressing change as we had ordered last week. She always complains and does not keep her wrap on and this past week she has done the same. She has completed a course of doxycycline. 09/25/2014 -- she has tolerated her dressing all week and has not removed it. She says it's hurting a lot today and would not like any debridement. We will do so as per her wishes. she did get a appointment with the vascular surgeon and sees him on 6/28. 10/03/2014 - She was seen by Dr. Tinnie Gens 09/30/2014. Assessment: I reviewed the previous venous duplex exam study performed in September 2015 which reveals no options for treating this venous ulcer. There is no significant superficial venous reflux to treat with laser ablation.Most recent ABI was 0.91 in the right foot with palpable dorsalis pedis pulse at 3+ Plan: patient needs to return to the [place] wound center and be treated by Dr. Con Memos. No arterial or venous options available from vascular standpoint or treatment of right leg stasis ulcer.Continued dressing changes and compression is the treatment of choice 10/16/2014 -- today will be her first application of Apligraf. 10/23/2014 -- we are going to be able to apply her second day of Apligraf next week. She is complaining of some tenderness in the region of Achilles tendon and this is possibly due to her wrap. 11/06/2014 -- she did not have Apligraf last week because the wound was not looking good but she says she is feeling much better  now. 11/13/2014 -- she has been doing well this week and she is here for her second Apligraf. 11/27/2014 -- she is here for her third application of Apligraf 12/12/2014 -- she is here for her fourth application of Apligraf. Also during these past 3 days she had an injury to her left forearm which she caught against the edge of her desk and has a lacerated wound which has gone down to the subcutaneous tissue. 12/26/2014 -- she is here for a last/fifth application of Apligraf. 04/10/2015 -- the patient is here for her first application of Puraply. 04/17/2015 -- the patient is here for her second application of Puraply. 04/24/2015 -- her Puraply was not ordered for today and hence he would use a collagen dressing locally. 05/01/2015 -- the patient is here for her third application of Puraply. 05/08/2015 -- the patient has been doing fine and overall has shown much improvement and  is here for a routine visit 05/15/2015 -- she was admitted to the hospital with viral gastroenteritis and acute renal failure with dehydration and has been on supportive care since February 7. She was discharged today and has come for her wound care. Details of her hospital admission and therapy reviewed. Objective Constitutional Pulse regular. Respirations normal and unlabored. Afebrile. Elizondo, Jeffrey C. ([id number]) Vitals Time Taken: 2:52 PM, Height: 58 in, Weight: 151 lbs, BMI: 31.6, Temperature: 98.0 F, Pulse: 68 bpm, Respiratory Rate: 18 breaths/min, Blood Pressure: 130/58 mmHg. Eyes Nonicteric. Reactive to light. Ears, Nose, Mouth, and Throat Lips, teeth, and gums WNL.Marland Kitchen Moist mucosa without lesions. Neck supple and nontender. No palpable supraclavicular or cervical adenopathy. Normal sized without goiter. Respiratory WNL. No retractions.. Cardiovascular Pedal Pulses WNL. No clubbing, cyanosis or edema. Lymphatic No adneopathy. No adenopathy. No adenopathy. Musculoskeletal Adexa without tenderness or  enlargement.. Digits and nails w/o clubbing, cyanosis, infection, petechiae, ischemia, or inflammatory conditions.Marland Kitchen Psychiatric Judgement and insight Intact.. No evidence of depression, anxiety, or agitation.. General Notes: the patient had very good control of her edema and the wounds have a significant eschar and after gently removing it that is excellent resolution of her ulceration. Integumentary (Hair, Skin) No suspicious lesions. No crepitus or fluctuance. No peri-wound warmth or erythema. No masses.. Wound #1 status is Open. Original cause of wound was Gradually Appeared. The wound is located on the Right,Medial Lower Leg. The wound measures 1.7cm length x 0.7cm width x 0.1cm depth; 0.935cm^2 area and 0.093cm^3 volume. The wound is limited to skin breakdown. There is no tunneling or undermining noted. There is a large amount of serosanguineous drainage noted. The wound margin is distinct with the outline attached to the wound base. There is large (67-100%) red, pink granulation within the wound bed. There is no necrotic tissue within the wound bed. The periwound skin appearance exhibited: Localized Edema, Maceration, Moist, Mottled, Erythema. The periwound skin appearance did not exhibit: Callus, Crepitus, Excoriation, Fluctuance, Friable, Induration, Rash, Scarring, Dry/Scaly, Atrophie Blanche, Cyanosis, Ecchymosis, Hemosiderin Staining, Pallor, Rubor. The surrounding wound skin color is noted with erythema which is circumferential. Periwound temperature was noted as No Abnormality. The periwound has tenderness on palpation. Wound #3 status is Open. Original cause of wound was Gradually Appeared. The wound is located on the Right,Proximal,Medial Lower Leg. The wound measures 0.5cm length x 0.5cm width x 0.1cm depth; Scrogham, Ronique C. ([id number]) 0.196cm^2 area and 0.02cm^3 volume. The wound is limited to skin breakdown. There is no tunneling or undermining noted. There is a large  amount of serous drainage noted. The wound margin is flat and intact. There is large (67-100%) red, pink granulation within the wound bed. There is no necrotic tissue within the wound bed. The periwound skin appearance exhibited: Maceration, Moist. The periwound skin appearance did not exhibit: Callus, Crepitus, Excoriation, Fluctuance, Friable, Induration, Localized Edema, Rash, Scarring, Dry/Scaly, Atrophie Blanche, Cyanosis, Ecchymosis, Hemosiderin Staining, Mottled, Pallor, Rubor, Erythema. Periwound temperature was noted as No Abnormality. The periwound has tenderness on palpation. Assessment Active Problems ICD-10 E11.622 - Type 2 diabetes mellitus with other skin ulcer E11.610 - Type 2 diabetes mellitus with diabetic neuropathic arthropathy I87.331 - Chronic venous hypertension (idiopathic) with ulcer and inflammation of right lower extremity S41.112A - Laceration without foreign body of left upper arm, initial encounter Procedures Wound #1 Wound #1 is a Venous Leg Ulcer located on the Right,Medial Lower Leg . There was a Non-Viable Tissue Open Wound/Selective [phone]) debridement with total area of 1.19 sq cm performed by  Christin Fudge, MD. with the following instrument(s): Forceps to remove Viable and Non-Viable tissue/material including Exudate, Fibrin/Slough, and Eschar after achieving pain control using Lidocaine 4% Topical Solution. A time out was conducted prior to the start of the procedure. There was no bleeding. The procedure was tolerated well with a pain level of 0 throughout and a pain level of 0 following the procedure. Post Debridement Measurements: 1.7cm length x 0.7cm width x 0.1cm depth; 0.093cm^3 volume. Post procedure Diagnosis Wound #1: Same as Pre-Procedure Wound #3 Wound #3 is a Venous Leg Ulcer located on the Right,Proximal,Medial Lower Leg . There was a Non-Viable Tissue Open Wound/Selective [phone]) debridement with total area of 0.25 sq cm  performed by Christin Fudge, MD. with the following instrument(s): Forceps to remove Non-Viable tissue/material including Exudate, Fibrin/Slough, and Eschar after achieving pain control using Lidocaine 4% Topical Solution. A time out was conducted prior to the start of the procedure. There was no bleeding. The procedure was tolerated well with a pain level of 0 throughout and a pain level of 0 following the procedure. Post Debridement Measurements: 0.5cm length x 0.5cm width x 0.1cm depth; 0.02cm^3 volume. Post procedure Diagnosis Wound #3: Same as Pre-Procedure Felix, Amrit C. ([id number]) Plan Wound Cleansing: Wound #1 Right,Medial Lower Leg: Cleanse wound with mild soap and water Anesthetic: Wound #1 Right,Medial Lower Leg: Topical Lidocaine 4% cream applied to wound bed prior to debridement Skin Barriers/Peri-Wound Care: Wound #1 Right,Medial Lower Leg: Skin Prep Secondary Dressing: Wound #1 Right,Medial Lower Leg: Foam - cutimed Dressing Change Frequency: Wound #1 Right,Medial Lower Leg: Change dressing every week Follow-up Appointments: Wound #1 Right,Medial Lower Leg: Return Appointment in 1 week. Edema Control: Wound #1 Right,Medial Lower Leg: 2 Layer Lite Compression System - Right Lower Extremity Elevate legs to the level of the heart and pump ankles as often as possible the wounds on right lower extremity have made an excellent progress this past week and today we'll use Cutimed Sorbact foam and her 2 layer wraps and see her back next week Electronic Signature(s) Signed: 06/19/2015 3:28:27 PM By: Christin Fudge MD, FACS Entered By: Christin Fudge on 06/19/2015 15:28:27 Tetro, Asami Loletha [name] ([id number]) -------------------------------------------------------------------------------- SuperBill Details Patient Name: Stauber, Noa C. Date of Service: 06/19/2015 Medical Record Number: [id number] Patient Account Number: [id number] Date of Birth/Sex: [birth year] (82 y.o.  Female) Treating RN: Montey Hora Primary Care Physician: Ria Bush Other Clinician: Referring Physician: Ria Bush Treating Physician/Extender: Frann Rider in Treatment: 74 Diagnosis Coding ICD-10 Codes Code Description E11.622 Type 2 diabetes mellitus with other skin ulcer E11.610 Type 2 diabetes mellitus with diabetic neuropathic arthropathy Chronic venous hypertension (idiopathic) with ulcer and inflammation of right lower I87.331 extremity S41.112A Laceration without foreign body of left upper arm, initial encounter Facility Procedures CPT4: Description Modifier Quantity Code [id number] - DEBRIDE WOUND 1ST 20 SQ CM OR < 1 ICD-10 Description Diagnosis E11.622 Type 2 diabetes mellitus with other skin ulcer I87.331 Chronic venous hypertension (idiopathic) with ulcer and inflammation  of right lower extremity Physician Procedures CPT4: Description Modifier Quantity Code [id number] - WC PHYS DEBR WO ANESTH 20 SQ CM 1 ICD-10 Description Diagnosis E11.622 Type 2 diabetes mellitus with other skin ulcer I87.331 Chronic venous hypertension (idiopathic) with ulcer and inflammation of  right lower extremity Electronic Signature(s) Signed: 06/19/2015 3:29:04 PM By: Christin Fudge MD, FACS Previous Signature: 06/19/2015 3:28:41 PM Version By: Christin Fudge MD, FACS Entered By: Christin Fudge on 06/19/2015 15:29:04

## 2015-06-25 ENCOUNTER — Encounter: Payer: Medicare Other | Admitting: Surgery

## 2015-06-25 DIAGNOSIS — E11610 Type 2 diabetes mellitus with diabetic neuropathic arthropathy: Secondary | ICD-10-CM | POA: Diagnosis not present

## 2015-06-25 DIAGNOSIS — Z951 Presence of aortocoronary bypass graft: Secondary | ICD-10-CM | POA: Diagnosis not present

## 2015-06-25 DIAGNOSIS — S41112A Laceration without foreign body of left upper arm, initial encounter: Secondary | ICD-10-CM | POA: Diagnosis not present

## 2015-06-25 DIAGNOSIS — L97811 Non-pressure chronic ulcer of other part of right lower leg limited to breakdown of skin: Secondary | ICD-10-CM | POA: Diagnosis not present

## 2015-06-25 DIAGNOSIS — I872 Venous insufficiency (chronic) (peripheral): Secondary | ICD-10-CM | POA: Diagnosis not present

## 2015-06-25 DIAGNOSIS — E11622 Type 2 diabetes mellitus with other skin ulcer: Secondary | ICD-10-CM | POA: Diagnosis not present

## 2015-06-25 DIAGNOSIS — I10 Essential (primary) hypertension: Secondary | ICD-10-CM | POA: Diagnosis not present

## 2015-06-25 DIAGNOSIS — I87331 Chronic venous hypertension (idiopathic) with ulcer and inflammation of right lower extremity: Secondary | ICD-10-CM | POA: Diagnosis not present

## 2015-06-26 ENCOUNTER — Ambulatory Visit: Payer: Medicare Other | Admitting: General Surgery

## 2015-06-27 NOTE — Progress Notes (Addendum)
MAYSON, GAVIN ([id number]) Visit Report for 06/25/2015 Chief Complaint Document Details Patient Name: Dao, Libi C. Date of Service: 06/25/2015 3:30 PM Medical Record Number: [id number] Patient Account Number: [id number] Date of Birth/Sex: 1927-07-20 (82 y.o. Female) Treating RN: Carolyne Fiscal, Debi Primary Care Physician: Ria Bush Other Clinician: Referring Physician: Ria Bush Treating Physician/Extender: Frann Rider in Treatment: 39 Information Obtained from: Patient Chief Complaint Patient presents for treatment of an open ulcer due to venous insufficiency. 82 year old patient who is known to me from the [facility] wound care center now comes in follow up here for a chronic venous ulceration of her right lower extremity which she's had for about 6 months. Electronic Signature(s) Signed: 06/25/2015 3:53:15 PM By: Christin Fudge MD, FACS Entered By: Christin Fudge on 06/25/2015 15:53:14 Keplinger, [name] ([id number]) -------------------------------------------------------------------------------- HPI Details Patient Name: Domagalski, Romie C. Date of Service: 06/25/2015 3:30 PM Medical Record Number: [id number] Patient Account Number: [id number] Date of Birth/Sex: 30-Jun-1927 (82 y.o. Female) Treating RN: Carolyne Fiscal, Debi Primary Care Physician: Ria Bush Other Clinician: Referring Physician: Ria Bush Treating Physician/Extender: Frann Rider in Treatment: 29 History of Present Illness Location: ulcer in the right lower extremity for several months Quality: Patient reports experiencing a dull pain to affected area(s). Severity: Patient states wound (s) are getting better. Duration: Patient has had the wound for > 3 months prior to seeking treatment at the wound center Timing: Pain in wound is Intermittent (comes and goes Context: The wound appeared gradually over time Modifying Factors: Consults to this date include: vascular surgery  for the veins in December 2015 and she's been seeing the wound care center at [facility] for several months. Associated Signs and Symptoms: Patient reports having difficulty standing for long periods. HPI Description: This is an 82 yrs old female we have been following since the beginning of January for a wound on the right medial ankle. She had previously had venous ablation of the greater saphenous vein by Dr. Kellie Simmering. She came to [place] with erythema of the right leg and subsequently is opened in the medial aspect of the right ankle. She has not been very compliant with compression. We ordered her at juxta lite stocking, however she apparently has not been able to wear it. The area on the medial aspect of her left ankle has much less induration and erythema. The edema is better controlled. No evidence of infection is noted. 07/18/14; no major change in the condition of this wound. There is still surface slough and probably some drainage. 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 All other veins showed no evidence of DVT, superficial venous thrombosis or incompetence. however she does have manifestations of chronic venous hypertension and I believe we will change her dressing over to Prisma and a Profore light dressing. she sees her primary care doctor regularly and her last hemoglobin A1c has been around 7%.her other comorbidities are hypertension, CABG in 1998, peptic ulcer disease, history of echo with a ejection fraction of 55-60% and grade 1 diastolic dysfunction. past surgical history include coronary artery bypass graft in 1998, cholecystectomy, appendectomy, hand surgery, endovenous ablation of the saphenous vein with laser by Dr. Kellie Simmering in December 2015. 08/29/2014 - she complains of increased calf pain over the past week. removed her compression bandage and pain  improved. no fever or chills. Mild clear drainage. 09/04/2014 -- she has not been compliant and  has not done a dressing change as we had ordered last week. She always complains and does not keep her wrap on and this past week she has done the same. She has completed a course of doxycycline. 09/25/2014 -- she has tolerated her dressing all week and has not removed it. She says it's hurting a lot today and would not like any debridement. We will do so as per her wishes. she did get a appointment with the vascular surgeon and sees him on 6/28. SOUAD, BARTER ([id number]) 10/03/2014 - She was seen by Dr. Tinnie Gens 09/30/2014. Assessment: I reviewed the previous venous duplex exam study performed in September 2015 which reveals no options for treating this venous ulcer. There is no significant superficial venous reflux to treat with laser ablation.Most recent ABI was 0.91 in the right foot with palpable dorsalis pedis pulse at 3+ Plan: patient needs to return to the [place] wound center and be treated by Dr. Con Memos. No arterial or venous options available from vascular standpoint or treatment of right leg stasis ulcer.Continued dressing changes and compression is the treatment of choice 10/16/2014 -- today will be her first application of Apligraf. 10/23/2014 -- we are going to be able to apply her second day of Apligraf next week. She is complaining of some tenderness in the region of Achilles tendon and this is possibly due to her wrap. 11/06/2014 -- she did not have Apligraf last week because the wound was not looking good but she says she is feeling much better now. 11/13/2014 -- she has been doing well this week and she is here for her second Apligraf. 11/27/2014 -- she is here for her third application of Apligraf 12/12/2014 -- she is here for her fourth application of Apligraf. Also during these past 3 days she had an injury to her left forearm which she caught against the edge of  her desk and has a lacerated wound which has gone down to the subcutaneous tissue. 12/26/2014 -- she is here for a last/fifth application of Apligraf. 04/10/2015 -- the patient is here for her first application of Puraply. 04/17/2015 -- the patient is here for her second application of Puraply. 04/24/2015 -- her Puraply was not ordered for today and hence he would use a collagen dressing locally. 05/01/2015 -- the patient is here for her third application of Puraply. 05/08/2015 -- the patient has been doing fine and overall has shown much improvement and is here for a routine visit 05/15/2015 -- she was admitted to the hospital with viral gastroenteritis and acute renal failure with dehydration and has been on supportive care since February 7. She was discharged today and has come for her wound care. Details of her hospital admission and therapy reviewed. Electronic Signature(s) Signed: 06/25/2015 3:53:26 PM By: Christin Fudge MD, FACS Entered By: Christin Fudge on 06/25/2015 15:53:26 Kings, [name] ([id number]) -------------------------------------------------------------------------------- Physical Exam Details Patient Name: Vignola, Johanna C. Date of Service: 06/25/2015 3:30 PM Medical Record Number: [id number] Patient Account Number: [id number] Date of Birth/Sex: [birth year] (82 y.o. Female) Treating RN: Carolyne Fiscal, Debi Primary Care Physician: Ria Bush Other Clinician: Referring Physician: Ria Bush Treating Physician/Extender: Frann Rider in Treatment: 44 Constitutional . Pulse regular. Respirations normal and unlabored. Afebrile. . Eyes Nonicteric. Reactive to light. Ears, Nose, Mouth, and Throat Lips, teeth, and gums WNL.Marland Kitchen Moist mucosa without lesions. Neck supple and nontender. No palpable supraclavicular or cervical adenopathy. Normal sized without goiter. Respiratory WNL. No retractions.. Cardiovascular Pedal Pulses WNL. No clubbing, cyanosis or  edema. Lymphatic No adneopathy. No adenopathy. No adenopathy. Musculoskeletal Adexa without tenderness or enlargement.. Digits and nails w/o clubbing, cyanosis, infection, petechiae, ischemia, or inflammatory conditions.. Integumentary (Hair, Skin) No suspicious lesions. No crepitus or fluctuance. No peri-wound warmth or erythema. No masses.Marland Kitchen Psychiatric Judgement and insight Intact.. No evidence of depression, anxiety, or agitation.. Notes the edema has gone down markedly and her wound is looking excellent with a tiny microulceration left Electronic Signature(s) Signed: 06/25/2015 3:54:03 PM By: Christin Fudge MD, FACS Entered By: Christin Fudge on 06/25/2015 15:54:02 Burruss, [name] ([id number]) -------------------------------------------------------------------------------- Physician Orders Details Patient Name: Brightbill, Annalise C. Date of Service: 06/25/2015 3:30 PM Medical Record Number: [id number] Patient Account Number: [id number] Date of Birth/Sex: [birth year] (82 y.o. Female) Treating RN: Carolyne Fiscal, Debi Primary Care Physician: Ria Bush Other Clinician: Referring Physician: Ria Bush Treating Physician/Extender: Frann Rider in Treatment: 51 Verbal / Phone Orders: Yes Clinician: Carolyne Fiscal, Debi Read Back and Verified: Yes Diagnosis Coding ICD-10 Coding Code Description E11.622 Type 2 diabetes mellitus with other skin ulcer E11.610 Type 2 diabetes mellitus with diabetic neuropathic arthropathy Chronic venous hypertension (idiopathic) with ulcer and inflammation of right lower I87.331 extremity Wound Cleansing Wound #1 Right,Medial Lower Leg o Cleanse wound with mild soap and water Anesthetic Wound #1 Right,Medial Lower Leg o Topical Lidocaine 4% cream applied to wound bed prior to debridement Skin Barriers/Peri-Wound Care Wound #1 Right,Medial Lower Leg o Skin Prep Secondary Dressing Wound #1 Right,Medial Lower Leg o Foam -  cutimed Dressing Change Frequency Wound #1 Right,Medial Lower Leg o Change dressing every week Follow-up Appointments Wound #1 Right,Medial Lower Leg o Return Appointment in 1 week. Edema Control Wound #1 Right,Medial Lower Leg o 2 Layer Lite Compression System - Right Lower Extremity o Elevate legs to the level of the heart and pump ankles as often as possible Templin, Isela Loletha Grayer ([id number]) Electronic Signature(s) Signed: 06/29/2015 4:52:55 PM By: Christin Fudge MD, FACS Signed: 06/29/2015 5:48:30 PM By: Alric Quan Entered By: Alric Quan on 06/26/2015 13:05:37 Dobbins, [name] ([id number]) -------------------------------------------------------------------------------- Problem List Details Patient Name: Liburd, Willamae C. Date of Service: 06/25/2015 3:30 PM Medical Record Number: [id number] Patient Account Number: [id number] Date of Birth/Sex: 02/26/1928 (82 y.o. Female) Treating RN: Carolyne Fiscal, Debi Primary Care Physician: Ria Bush Other Clinician: Referring Physician: Ria Bush Treating Physician/Extender: Frann Rider in Treatment: 79 Active Problems ICD-10 Encounter Code Description Active Date Diagnosis E11.622 Type 2 diabetes mellitus with other skin ulcer 08/15/2014 Yes E11.610 Type 2 diabetes mellitus with diabetic neuropathic 08/15/2014 Yes arthropathy I87.331 Chronic venous hypertension (idiopathic) with ulcer and 08/15/2014 Yes inflammation of right lower extremity Inactive Problems Resolved Problems ICD-10 Code Description Active Date Resolved Date L03.115 Cellulitis of right lower limb 08/29/2014 08/29/2014 S41.112A Laceration without foreign body of left upper arm, initial 12/12/2014 12/12/2014 encounter Electronic Signature(s) Signed: 06/25/2015 3:53:07 PM By: Christin Fudge MD, FACS Entered By: Christin Fudge on 06/25/2015 15:53:07 Toenjes, [name]  ([id number]) -------------------------------------------------------------------------------- Progress Note Details Patient Name: Whidbee, Kyliegh C. Date of Service: 06/25/2015 3:30 PM Medical Record Number: [id number] Patient Account Number: [id number] Date of Birth/Sex: Mar 11, 1928 (82 y.o. Female) Treating RN: Carolyne Fiscal, Debi Primary Care Physician: Ria Bush Other Clinician: Referring Physician: Ria Bush Treating Physician/Extender: Frann Rider in Treatment: 38 Subjective Chief Complaint Information obtained from Patient Patient presents for treatment of an open ulcer due to venous insufficiency. 82 year old patient who is known to me from the [facility] wound care center now comes in follow up here for a chronic venous ulceration of her right lower  extremity which she's had for about 6 months. History of Present Illness (HPI) The following HPI elements were documented for the patient's wound: Location: ulcer in the right lower extremity for several months Quality: Patient reports experiencing a dull pain to affected area(s). Severity: Patient states wound (s) are getting better. Duration: Patient has had the wound for > 3 months prior to seeking treatment at the wound center Timing: Pain in wound is Intermittent (comes and goes Context: The wound appeared gradually over time Modifying Factors: Consults to this date include: vascular surgery for the veins in December 2015 and she's been seeing the wound care center at [facility] for several months. Associated Signs and Symptoms: Patient reports having difficulty standing for long periods. This is an 82 yrs old female we have been following since the beginning of January for a wound on the right medial ankle. She had previously had venous ablation of the greater saphenous vein by Dr. Kellie Simmering. She came to [place] with erythema of the right leg and subsequently is opened in the medial aspect of the right ankle. She has  not been very compliant with compression. We ordered her at juxta lite stocking, however she apparently has not been able to wear it. The area on the medial aspect of her left ankle has much less induration and erythema. The edema is better controlled. No evidence of infection is noted. 07/18/14; no major change in the condition of this wound. There is still surface slough and probably some drainage. 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 All other veins showed no evidence of DVT, superficial venous thrombosis or incompetence. however she does have manifestations of chronic venous hypertension and I believe we will change her dressing over to Prisma and a Profore light dressing. she sees her primary care doctor regularly and her last hemoglobin A1c has been around 7%.her other comorbidities are hypertension, CABG in 1998, peptic ulcer disease, history of echo with a ejection fraction of 55-60% and grade 1 diastolic dysfunction. past surgical history include coronary artery bypass graft in 1998, cholecystectomy, appendectomy, hand surgery, endovenous ablation of the saphenous vein with laser by Dr. Kellie Simmering in December 2015. IASIA, CARKHUFF ([id number]) 08/29/2014 - she complains of increased calf pain over the past week. removed her compression bandage and pain improved. no fever or chills. Mild clear drainage. 09/04/2014 -- she has not been compliant and has not done a dressing change as we had ordered last week. She always complains and does not keep her wrap on and this past week she has done the same. She has completed a course of doxycycline. 09/25/2014 -- she has tolerated her dressing all week and has not removed it. She says it's hurting a lot today and would not like any debridement. We will do so as per her wishes. she did get a appointment with the vascular  surgeon and sees him on 6/28. 10/03/2014 - She was seen by Dr. Tinnie Gens 09/30/2014. Assessment: I reviewed the previous venous duplex exam study performed in September 2015 which reveals no options for treating this venous ulcer. There is no significant superficial venous reflux to treat with laser ablation.Most recent ABI was 0.91 in the right foot with palpable dorsalis pedis pulse at 3+ Plan: patient needs to return to the [facility] wound center and be treated by Dr. Con Memos. No arterial or venous options available  from vascular standpoint or treatment of right leg stasis ulcer.Continued dressing changes and compression is the treatment of choice 10/16/2014 -- today will be her first application of Apligraf. 10/23/2014 -- we are going to be able to apply her second day of Apligraf next week. She is complaining of some tenderness in the region of Achilles tendon and this is possibly due to her wrap. 11/06/2014 -- she did not have Apligraf last week because the wound was not looking good but she says she is feeling much better now. 11/13/2014 -- she has been doing well this week and she is here for her second Apligraf. 11/27/2014 -- she is here for her third application of Apligraf 12/12/2014 -- she is here for her fourth application of Apligraf. Also during these past 3 days she had an injury to her left forearm which she caught against the edge of her desk and has a lacerated wound which has gone down to the subcutaneous tissue. 12/26/2014 -- she is here for a last/fifth application of Apligraf. 04/10/2015 -- the patient is here for her first application of Puraply. 04/17/2015 -- the patient is here for her second application of Puraply. 04/24/2015 -- her Puraply was not ordered for today and hence he would use a collagen dressing locally. 05/01/2015 -- the patient is here for her third application of Puraply. 05/08/2015 -- the patient has been doing fine and overall has shown much  improvement and is here for a routine visit 05/15/2015 -- she was admitted to the hospital with viral gastroenteritis and acute renal failure with dehydration and has been on supportive care since February 7. She was discharged today and has come for her wound care. Details of her hospital admission and therapy reviewed. Objective Constitutional Pulse regular. Respirations normal and unlabored. Afebrile. Wentz, Jazmynn C. ([id number]) Vitals Time Taken: 3:27 PM, Height: 58 in, Weight: 151 lbs, BMI: 31.6, Temperature: 97.7 F, Pulse: 74 bpm, Respiratory Rate: 18 breaths/min, Blood Pressure: 122/58 mmHg. Eyes Nonicteric. Reactive to light. Ears, Nose, Mouth, and Throat Lips, teeth, and gums WNL.Marland Kitchen Moist mucosa without lesions. Neck supple and nontender. No palpable supraclavicular or cervical adenopathy. Normal sized without goiter. Respiratory WNL. No retractions.. Cardiovascular Pedal Pulses WNL. No clubbing, cyanosis or edema. Lymphatic No adneopathy. No adenopathy. No adenopathy. Musculoskeletal Adexa without tenderness or enlargement.. Digits and nails w/o clubbing, cyanosis, infection, petechiae, ischemia, or inflammatory conditions.Marland Kitchen Psychiatric Judgement and insight Intact.. No evidence of depression, anxiety, or agitation.. General Notes: the edema has gone down markedly and her wound is looking excellent with a tiny microulceration left Integumentary (Hair, Skin) No suspicious lesions. No crepitus or fluctuance. No peri-wound warmth or erythema. No masses.. Wound #1 status is Open. Original cause of wound was Gradually Appeared. The wound is located on the Right,Medial Lower Leg. The wound measures 0.1cm length x 0.1cm width x 0.1cm depth; 0.008cm^2 area and 0.001cm^3 volume. The wound is limited to skin breakdown. There is no tunneling or undermining noted. There is a none present amount of drainage noted. The wound margin is distinct with the outline attached to the  wound base. There is large (67-100%) red, pink granulation within the wound bed. There is no necrotic tissue within the wound bed. The periwound skin appearance exhibited: Localized Edema, Mottled, Erythema. The periwound skin appearance did not exhibit: Callus, Crepitus, Excoriation, Fluctuance, Friable, Induration, Rash, Scarring, Dry/Scaly, Maceration, Moist, Atrophie Blanche, Cyanosis, Ecchymosis, Hemosiderin Staining, Pallor, Rubor. The surrounding wound skin color is noted with erythema which is circumferential. Periwound temperature  was noted as No Abnormality. The periwound has tenderness on palpation. Wound #3 status is Healed - Epithelialized. Original cause of wound was Gradually Appeared. The wound is located on the Right,Proximal,Medial Lower Leg. The wound measures 0cm length x 0cm width x 0cm Walls, Yakelin C. ([id number]) depth; 0cm^2 area and 0cm^3 volume. The wound is limited to skin breakdown. There is no tunneling or undermining noted. There is a large amount of serous drainage noted. The wound margin is flat and intact. There is no granulation within the wound bed. There is no necrotic tissue within the wound bed. The periwound skin appearance did not exhibit: Callus, Crepitus, Excoriation, Fluctuance, Friable, Induration, Localized Edema, Rash, Scarring, Dry/Scaly, Maceration, Moist, Atrophie Blanche, Cyanosis, Ecchymosis, Hemosiderin Staining, Mottled, Pallor, Rubor, Erythema. Periwound temperature was noted as No Abnormality. The periwound has tenderness on palpation. Assessment Active Problems ICD-10 E11.622 - Type 2 diabetes mellitus with other skin ulcer E11.610 - Type 2 diabetes mellitus with diabetic neuropathic arthropathy I87.331 - Chronic venous hypertension (idiopathic) with ulcer and inflammation of right lower extremity Plan Wound Cleansing: Wound #1 Right,Medial Lower Leg: Cleanse wound with mild soap and water Anesthetic: Wound #1 Right,Medial Lower  Leg: Topical Lidocaine 4% cream applied to wound bed prior to debridement Skin Barriers/Peri-Wound Care: Wound #1 Right,Medial Lower Leg: Skin Prep Secondary Dressing: Wound #1 Right,Medial Lower Leg: Foam - cutimed Dressing Change Frequency: Wound #1 Right,Medial Lower Leg: Change dressing every week Follow-up Appointments: Wound #1 Right,Medial Lower Leg: Return Appointment in 1 week. Edema Control: Wound #1 Right,Medial Lower Leg: 2 Layer Lite Compression System - Right Lower Extremity Elevate legs to the level of the heart and pump ankles as often as possible Macmullen, Damica C. ([id number]) The wounds on right lower extremity have made an excellent progress this past week and today we'll use Cutimed Sorbact foam and her 2 layer wraps and see her back next week. In anticipation of wound healing completely I will order a pair of juxta light compression wraps and also the dual layer Circaid stockings. Electronic Signature(s) Signed: 06/29/2015 4:55:47 PM By: Christin Fudge MD, FACS Previous Signature: 06/25/2015 3:55:37 PM Version By: Christin Fudge MD, FACS Entered By: Christin Fudge on 06/29/2015 16:55:47 Gailey, [name] ([id number]) -------------------------------------------------------------------------------- SuperBill Details Patient Name: Brucks, Mihaela C. Date of Service: 06/25/2015 Medical Record Number: [id number] Patient Account Number: [id number] Date of Birth/Sex: Sep 14, 1927 (82 y.o. Female) Treating RN: Carolyne Fiscal, Debi Primary Care Physician: Ria Bush Other Clinician: Referring Physician: Ria Bush Treating Physician/Extender: Frann Rider in Treatment: 44 Diagnosis Coding ICD-10 Codes Code Description E11.622 Type 2 diabetes mellitus with other skin ulcer E11.610 Type 2 diabetes mellitus with diabetic neuropathic arthropathy Chronic venous hypertension (idiopathic) with ulcer and inflammation of right lower I87.331 extremity Facility  Procedures CPT4 Code: [id number] Description: [phone] - WOUND CARE VISIT-LEV 2 EST PT Modifier: Quantity: 1 Physician Procedures CPT4: Description Modifier Quantity Code [id number] - WC PHYS LEVEL 3 - EST PT 1 ICD-10 Description Diagnosis E11.622 Type 2 diabetes mellitus with other skin ulcer E11.610 Type 2 diabetes mellitus with diabetic neuropathic arthropathy I87.331 Chronic  venous hypertension (idiopathic) with ulcer and inflammation of right lower extremity Electronic Signature(s) Signed: 06/29/2015 4:52:55 PM By: Christin Fudge MD, FACS Signed: 06/29/2015 5:48:30 PM By: Alric Quan Previous Signature: 06/25/2015 3:55:52 PM Version By: Christin Fudge MD, FACS Entered By: Alric Quan on 06/26/2015 13:06:29

## 2015-06-29 ENCOUNTER — Encounter: Payer: Self-pay | Admitting: Endocrinology

## 2015-06-29 ENCOUNTER — Ambulatory Visit (INDEPENDENT_AMBULATORY_CARE_PROVIDER_SITE_OTHER): Payer: Medicare Other | Admitting: Endocrinology

## 2015-06-29 VITALS — BP 116/72 | HR 74 | Temp 97.7°F | Resp 14 | Ht <= 58 in | Wt 150.0 lb

## 2015-06-29 DIAGNOSIS — I2510 Atherosclerotic heart disease of native coronary artery without angina pectoris: Secondary | ICD-10-CM

## 2015-06-29 DIAGNOSIS — E1149 Type 2 diabetes mellitus with other diabetic neurological complication: Secondary | ICD-10-CM | POA: Diagnosis not present

## 2015-06-29 LAB — MICROALBUMIN / CREATININE URINE RATIO
CREATININE, U: 46.4 mg/dL
MICROALB/CREAT RATIO: 1.5 mg/g (ref 0.0–30.0)

## 2015-06-29 LAB — POCT GLYCOSYLATED HEMOGLOBIN (HGB A1C): Hemoglobin A1C: 7

## 2015-06-29 NOTE — Patient Instructions (Signed)
Check blood sugars on waking up 1-2   times a week Also check blood sugars about 2 hours after a meal and do this after different meals by rotation  Recommended blood sugar levels on waking up is 90-130 and about 2 hours after meal is 130-160  Please bring your blood sugar monitor to each visit, thank you  

## 2015-06-29 NOTE — Progress Notes (Signed)
[name], female   DOB: 1928-02-15, 82 y.o.   MRN: [id number]   Reason for Appointment: Diabetes follow-up   History of Present Illness   Diagnosis: Type 2 DIABETES MELITUS, date of diagnosis: 1991   She has had mild diabetes for several years and has been on Actos only since about 2001 Her A1c has generally been around or under 7% She was given a trial of metformin because of her difficulty with weight loss and also edema but she felt that it made her blood sugars go up and she will not try it again Also she thinks her blood sugars were higher with a trial of Januvia  She is currently taking Actos 30 mg daily since 01/2014  Again she is is only checking her sugar in the afternoon randomly and did not bring any record or her monitor today A1c has generally been near 7% and the same at 7% today She has difficulty with exercise because of leg pain Her weight is stable     Oral hypoglycemic drugs: Actos 30 mg daily        Side effects from medications: None Monitors blood glucose: ?  Frequency .    Glucometer: One Touch.           Blood Glucose readings from recall: Range 115-170, high only once after eating cereal in the morning  Hypoglycemia frequency: Never.           Meals: 2  meals per day.  usually compliant with diet.  She likes to eat cereal in the morning some days not getting any protein with that         Physical activity:  minimal           Dietician visit: Most recent: Several years ago             Wt Readings from Last 3 Encounters:  06/29/15 150 lb (68.04 kg)  06/16/15 147 lb 8 oz (66.906 kg)  05/18/15 152 lb 8 oz (69.174 kg)   Lab Results  Component Value Date   HGBA1C 7.0 06/29/2015   HGBA1C 7 03/02/2015   HGBA1C 6.7* 10/31/2014   Lab Results  Component Value Date   MICROALBUR <0.7 06/29/2015   LDLCALC 65 04/13/2015   CREATININE 0.65 05/15/2015       Medication List       This list is accurate as of: 06/29/15  9:08  PM.  Always use your most recent med list.               acidophilus Caps capsule  Take 1 capsule by mouth daily.     ALPRAZolam 1 MG tablet  Commonly known as:  XANAX  Take 0.5 mg by mouth 4 (four) times daily as needed for anxiety.     amoxicillin 500 MG capsule  Commonly known as:  AMOXIL  Take 1 capsule (500 mg total) by mouth 3 (three) times daily.     aspirin EC 81 MG tablet  Take 81 mg by mouth daily.     Dexlansoprazole 30 MG capsule  Commonly known as:  DEXILANT  Take 1 capsule (30 mg total) by mouth daily.     ferrous sulfate 325 (65 FE) MG tablet  Take 1 tablet (325 mg total) by mouth daily with breakfast.     furosemide 20 MG tablet  Commonly known as:  LASIX  Take 20 mg by mouth daily.     glucosamine-chondroitin 500-400 MG  tablet  Take 1 tablet by mouth daily.     HYDROcodone-acetaminophen 5-325 MG tablet  Commonly known as:  NORCO/VICODIN  Take 0.5-1 tablets by mouth every 6 (six) hours as needed for moderate pain.     loperamide 2 MG capsule  Commonly known as:  IMODIUM  Take 2 mg by mouth 4 (four) times daily as needed for diarrhea or loose stools.     nitroGLYCERIN 0.4 MG SL tablet  Commonly known as:  NITROSTAT  Place 0.4 mg under the tongue every 5 (five) minutes as needed for chest pain.     nystatin-triamcinolone ointment  Commonly known as:  MYCOLOG  Apply 1 application topically 2 (two) times daily as needed (for itching).     pioglitazone 30 MG tablet  Commonly known as:  ACTOS  Take 30 mg by mouth every evening.     pravastatin 20 MG tablet  Commonly known as:  PRAVACHOL  Take 20 mg by mouth at bedtime.     ramipril 5 MG capsule  Commonly known as:  ALTACE  Take 5 mg by mouth every evening.     traZODone 150 MG tablet  Commonly known as:  DESYREL  Take 150 mg by mouth at bedtime.     triamcinolone cream 0.1 %  Commonly known as:  KENALOG  APPLY 1 APPLICATION TOPICALLY 2 (TWO) TIMES DAILY.     vitamin B-12 1000 MCG tablet    Commonly known as:  CYANOCOBALAMIN  Take 1,000 mcg by mouth daily.     vitamin E 400 UNIT capsule  Take 400 Units by mouth daily.        Allergies:  Allergies  Allergen Reactions  . Amoxicillin-Pot Clavulanate Other (See Comments)    Reaction:  Unknown   . Metformin And Related Diarrhea    Past Medical History  Diagnosis Date  . Hypertension   . Coronary artery disease     CABG 1998  . Anxiety   . Diastolic dysfunction   . Hyperlipidemia   . PUD (peptic ulcer disease)   . Microcytic anemia   . Depression   . Diabetes mellitus     type 2, followed by endo.  . Diverticulosis   . Hemorrhoids   . History of heart attack   . Irritable bowel syndrome   . Chronic back pain 03/2012    lumbar DDD with herniation - s/p L S1 transforaminal ESI (10/2012 Dr. Sharlet Salina)  . Varicose veins   . Cystocele   . Hx of echocardiogram     a. Echo 10/13: mild LVH, EF 55-60%, Gr 1 diast dysfn, PASP 32  . Acute deep vein thrombosis (DVT) of distal vein of right lower extremity ([place]) 12/2013    Acute thrombus of R proximal gastrocnemius vein. Symptomatic provoked distal DVT  . Cellulitis of leg, left 01/16/2014  . PAD (peripheral artery disease) ([place]) 2013    ABI: R 0.91, L 0.83  . Chronic venous insufficiency   . Ankle wound     Past Surgical History  Procedure Laterality Date  . Tympanoplasty  1960s    R side  . Coronary artery bypass graft  1998  . Cataract surgery    . Cholecystectomy    . Appendectomy      per pt report  . Colonoscopy  02/2010    extensive diverticulosis throughout, small internal hemorrhoids, rec rpt 5 yrs (Dr. Allyn Kenner)  . Hand surgery    . Lexiscan myoview  03/2011    Small basal inferolateral  and anterolateral reversible perfusion defect suggests ischemia.  EF was normal.   old infarct, no new ischemia  . Toe surgery    . Abdominal hysterectomy  1975    TAH,BSO  . Vault prolapse a & p repair  2009    Dr [facility] hospital  . Dexa scan  09/2011     femur -0.8, forearm 0.6 => normal  . Esi  2014    L S1 transforaminal ESI x2 ([facility]) without improvement  . Abi  2013    R 0.91, L 0.83  . Endovenous ablation saphenous vein w/ laser Left 03/2014    Kellie Simmering  . Cardiovascular stress test  2015    low risk myoview ([facility])    Family History  Problem Relation Age of Onset  . Hypertension Mother   . Stroke Mother   . Diabetes Father   . Diabetes Sister   . Diabetes Brother   . Colon cancer Neg Hx     Social History:  reports that she quit smoking about 36 years ago. She has never used smokeless tobacco. She reports that she does not drink alcohol or use illicit drugs.  Review of Systems:  She is complaining of fatigueOn each visit.     Leg ulcer on the right lower leg: This apparently has healed   HYPERTENSION:  well-controlled  Also takes Lasix for edema  No microalbuminuria present and has normal renal function and potassium   Lab Results  Component Value Date   CREATININE 0.65 05/15/2015   BUN 18 05/15/2015   NA 140 05/15/2015   K 4.2 05/15/2015   CL 111 05/15/2015   CO2 24 05/15/2015     HYPERLIPIDEMIA: The lipid abnormality consists of elevated LDL treated with pravastatin low-dose, followed by PCP.  Lab Results  Component Value Date   CHOL 121* 04/13/2015   HDL 34* 04/13/2015   LDLCALC 65 04/13/2015   TRIG 108 04/13/2015   CHOLHDL 3.6 04/13/2015    Last foot exam in 06/2014     Examination:   BP 116/72 mmHg  Pulse 74  Temp(Src) 97.7 F (36.5 C)  Resp 14  Ht 4\' 10"  (1.473 m)  Wt 150 lb (68.04 kg)  BMI 31.36 kg/m2  SpO2 94%  Body mass index is 31.36 kg/(m^2).    no edema present bilaterally   ASSESSMENT/ PLAN:   The patient's diabetes control is appearing fairly good with  A1c 7% Considering her age her level of control is fairly good She is not checking her blood sugars much lately at home and mostly in the afternoon and once after breakfast  A1c is 7% which is excellent for her age Has  been on Actos monotherapy for years with stable control; doing fairly well with 30 mg dose without any side effects  She has difficulty losing weight because of her inability to exercise  She was reminded about needing to check blood sugars at various times of the day and bring monitor for download She will need to add a protein for breakfast consistently  She was offered follow-up with primary care physician only for mild diabetes but she wants to come here regularly   Will  see her back in 6 months   [place] 06/29/2015, 9:08 PM

## 2015-06-30 NOTE — Progress Notes (Signed)
EVANNIE, EISELE ([id number]) Visit Report for 06/25/2015 Arrival Information Details Patient Name: [name], [name] Date of Service: 06/25/2015 3:30 PM Medical Record Number: [id number] Patient Account Number: [id number] Date of Birth/Sex: 1928/01/10 (82 y.o. Female) Treating RN: Carolyne Fiscal, Debi Primary Care Physician: Ria Bush Other Clinician: Referring Physician: Ria Bush Treating Physician/Extender: Frann Rider in Treatment: 84 Visit Information History Since Last Visit All ordered tests and consults were completed: No Patient Arrived: Walker Added or deleted any medications: No Arrival Time: 15:27 Any new allergies or adverse reactions: No Accompanied By: self Had a fall or experienced change in No Transfer Assistance: None activities of daily living that may affect Patient Identification Verified: Yes risk of falls: Secondary Verification Process Yes Signs or symptoms of abuse/neglect since last No Completed: visito Patient Requires Transmission- No Hospitalized since last visit: No Based Precautions: Pain Present Now: No Patient Has Alerts: Yes Patient Alerts: Patient on Blood Thinner Type II Diabetic 81 MG aspirin Electronic Signature(s) Signed: 06/29/2015 5:48:30 PM By: Alric Quan Entered By: Alric Quan on 06/25/2015 15:27:44 Zangara, [name] ([id number]) -------------------------------------------------------------------------------- Clinic Level of Care Assessment Details Patient Name: [name], [name] Date of Service: 06/25/2015 3:30 PM Medical Record Number: [id number] Patient Account Number: [id number] Date of Birth/Sex: [birth year] (82 y.o. Female) Treating RN: Carolyne Fiscal, Debi Primary Care Physician: Ria Bush Other Clinician: Referring Physician: Ria Bush Treating Physician/Extender: Frann Rider in Treatment: 34 Clinic Level of Care Assessment Items TOOL 4 Quantity Score []  - Use when  only an EandM is performed on FOLLOW-UP visit 0 ASSESSMENTS - Nursing Assessment / Reassessment []  - Reassessment of Co-morbidities (includes updates in patient status) 0 X - Reassessment of Adherence to Treatment Plan 1 5 ASSESSMENTS - Wound and Skin Assessment / Reassessment X - Simple Wound Assessment / Reassessment - one wound 1 5 []  - Complex Wound Assessment / Reassessment - multiple wounds 0 []  - Dermatologic / Skin Assessment (not related to wound area) 0 ASSESSMENTS - Focused Assessment X - Circumferential Edema Measurements - multi extremities 1 5 []  - Nutritional Assessment / Counseling / Intervention 0 []  - Lower Extremity Assessment (monofilament, tuning fork, pulses) 0 []  - Peripheral Arterial Disease Assessment (using hand held doppler) 0 ASSESSMENTS - Ostomy and/or Continence Assessment and Care []  - Incontinence Assessment and Management 0 []  - Ostomy Care Assessment and Management (repouching, etc.) 0 PROCESS - Coordination of Care X - Simple Patient / Family Education for ongoing care 1 15 []  - Complex (extensive) Patient / Family Education for ongoing care 0 []  - Staff obtains Programmer, systems, Records, Test Results / Process Orders 0 []  - Staff telephones HHA, Nursing Homes / Clarify orders / etc 0 []  - Routine Transfer to another Facility (non-emergent condition) 0 Haack, Lucindy C. ([id number]) []  - Routine Hospital Admission (non-emergent condition) 0 []  - New Admissions / Biomedical engineer / Ordering NPWT, Apligraf, etc. 0 []  - Emergency Hospital Admission (emergent condition) 0 X - Simple Discharge Coordination 1 10 []  - Complex (extensive) Discharge Coordination 0 PROCESS - Special Needs []  - Pediatric / Minor Patient Management 0 []  - Isolation Patient Management 0 []  - Hearing / Language / Visual special needs 0 []  - Assessment of Community assistance (transportation, D/C planning, etc.) 0 []  - Additional assistance / Altered mentation 0 []  - Support  Surface(s) Assessment (bed, cushion, seat, etc.) 0 INTERVENTIONS - Wound Cleansing / Measurement X - Simple Wound Cleansing - one wound 1 5 []  - Complex Wound Cleansing - multiple wounds 0 X -  Wound Imaging (photographs - any number of wounds) 1 5 []  - Wound Tracing (instead of photographs) 0 X - Simple Wound Measurement - one wound 1 5 []  - Complex Wound Measurement - multiple wounds 0 INTERVENTIONS - Wound Dressings X - Small Wound Dressing one or multiple wounds 1 10 []  - Medium Wound Dressing one or multiple wounds 0 []  - Large Wound Dressing one or multiple wounds 0 X - Application of Medications - topical 1 5 []  - Application of Medications - injection 0 INTERVENTIONS - Miscellaneous []  - External ear exam 0 Salinas, Marnisha C. ([id number]) []  - Specimen Collection (cultures, biopsies, blood, body fluids, etc.) 0 []  - Specimen(s) / Culture(s) sent or taken to Lab for analysis 0 []  - Patient Transfer (multiple staff / Harrel Lemon Lift / Similar devices) 0 []  - Simple Staple / Suture removal (25 or less) 0 []  - Complex Staple / Suture removal (26 or more) 0 []  - Hypo / Hyperglycemic Management (close monitor of Blood Glucose) 0 []  - Ankle / Brachial Index (ABI) - do not check if billed separately 0 X - Vital Signs 1 5 Has the patient been seen at the hospital within the last three years: Yes Total Score: 75 Level Of Care: New/Established - Level 2 Electronic Signature(s) Signed: 06/29/2015 5:48:30 PM By: Alric Quan Entered By: Alric Quan on 06/26/2015 13:06:11 Bry, [name] ([id number]) -------------------------------------------------------------------------------- Encounter Discharge Information Details Patient Name: Leone, Wren C. Date of Service: 06/25/2015 3:30 PM Medical Record Number: [id number] Patient Account Number: [id number] Date of Birth/Sex: 10-16-1927 (82 y.o. Female) Treating RN: Carolyne Fiscal, Debi Primary Care Physician: Ria Bush Other  Clinician: Referring Physician: Ria Bush Treating Physician/Extender: Frann Rider in Treatment: 29 Encounter Discharge Information Items Schedule Follow-up Appointment: No Medication Reconciliation completed No and provided to Patient/Care Taelyr Jantz: Provided on Clinical Summary of Care: 06/25/2015 Form Type Recipient Paper Patient LL Electronic Signature(s) Signed: 06/25/2015 4:02:18 PM By: Ruthine Dose Entered By: Ruthine Dose on 06/25/2015 16:02:18 Aumiller, [name] ([id number]) -------------------------------------------------------------------------------- Lower Extremity Assessment Details Patient Name: Piggee, Simmie C. Date of Service: 06/25/2015 3:30 PM Medical Record Number: [id number] Patient Account Number: [id number] Date of Birth/Sex: 07-18-1927 (82 y.o. Female) Treating RN: Carolyne Fiscal, Debi Primary Care Physician: Ria Bush Other Clinician: Referring Physician: Ria Bush Treating Physician/Extender: Frann Rider in Treatment: 44 Edema Assessment Assessed: [Left: No] [Right: No] E[Left: dema] [Right: :] Calf Left: Right: Point of Measurement: 27 cm From Medial Instep cm 25.5 cm Ankle Left: Right: Point of Measurement: 10 cm From Medial Instep cm 18 cm Vascular Assessment Pulses: Posterior Tibial Dorsalis Pedis Palpable: [Right:Yes] Extremity colors, hair growth, and conditions: Extremity Color: [Right:Mottled] Temperature of Extremity: [Right:Warm] Capillary Refill: [Right:< 3 seconds] Toe Nail Assessment Left: Right: Thick: Yes Discolored: Yes Deformed: Yes Improper Length and Hygiene: No Notes measurement from heel to knee 37cm Electronic Signature(s) Signed: 06/29/2015 5:48:30 PM By: Alric Quan Entered By: Alric Quan on 06/25/2015 15:53:10 Shimamoto, [name] ([id number]) Plasse, Mel Loletha Grayer ([id number]) -------------------------------------------------------------------------------- Multi Wound  Chart Details Patient Name: Hebner, Dierdre C. Date of Service: 06/25/2015 3:30 PM Medical Record Number: [id number] Patient Account Number: [id number] Date of Birth/Sex: [birth year] (82 y.o. Female) Treating RN: Carolyne Fiscal, Debi Primary Care Physician: Ria Bush Other Clinician: Referring Physician: Ria Bush Treating Physician/Extender: Frann Rider in Treatment: 44 Vital Signs Height(in): 58 Pulse(bpm): 74 Weight(lbs): 151 Blood Pressure 122/58 (mmHg): Body Mass Index(BMI): 32 Temperature(F): 97.7 Respiratory Rate 18 (breaths/min): Photos: [1:No Photos] [3:No Photos] [N/A:N/A] Wound Location: [1:Right Lower Leg - Medial  Right Lower Leg - Medial,] [3:Proximal] [N/A:N/A] Wounding Event: [1:Gradually Appeared] [3:Gradually Appeared] [N/A:N/A] Primary Etiology: [1:Venous Leg Ulcer] [3:Venous Leg Ulcer] [N/A:N/A] Comorbid History: [1:Cataracts, Anemia, Coronary Artery Disease, Coronary Artery Disease, Hypertension, Myocardial Hypertension, Myocardial Infarction, Type II Diabetes, Osteoarthritis Diabetes, Osteoarthritis] [3:Cataracts, Anemia, Infarction, Type II]  [N/A:N/A] Date Acquired: [1:04/04/2014] [3:05/15/2015] [N/A:N/A] Weeks of Treatment: [1:44] [3:5] [N/A:N/A] Wound Status: [1:Open] [3:Healed - Epithelialized] [N/A:N/A] Measurements L x W x D 0.1x0.1x0.1 [3:0x0x0] [N/A:N/A] (cm) Area (cm) : [1:0.008] [3:0] [N/A:N/A] Volume (cm) : [1:0.001] [3:0] [N/A:N/A] % Reduction in Area: [1:99.90%] [3:100.00%] [N/A:N/A] % Reduction in Volume: 99.90% [3:100.00%] [N/A:N/A] Classification: [1:Full Thickness Without Exposed Support Structures] [3:Partial Thickness] [N/A:N/A] HBO Classification: [1:Grade 1] [3:Grade 1] [N/A:N/A] Exudate Amount: [1:None Present] [3:Large] [N/A:N/A] Exudate Type: [1:N/A] [3:Serous] [N/A:N/A] Exudate Color: [1:N/A] [3:amber] [N/A:N/A] Wound Margin: [1:Distinct, outline attached Flat and Intact] [N/A:N/A] Granulation Amount: [1:Large  (67-100%)] [3:None Present (0%)] [N/A:N/A] Granulation Quality: [1:Red, Pink] [3:N/A] [N/A:N/A] Necrotic Amount: [1:None Present (0%)] [3:None Present (0%)] [N/A:N/A] Exposed Structures: Fascia: No Fascia: No N/A Fat: No Fat: No Tendon: No Tendon: No Muscle: No Muscle: No Joint: No Joint: No Bone: No Bone: No Limited to Skin Limited to Skin Breakdown Breakdown Epithelialization: Medium (34-66%) Large (67-100%) N/A Periwound Skin Texture: Edema: Yes Edema: No N/A Excoriation: No Excoriation: No Induration: No Induration: No Callus: No Callus: No Crepitus: No Crepitus: No Fluctuance: No Fluctuance: No Friable: No Friable: No Rash: No Rash: No Scarring: No Scarring: No Periwound Skin Maceration: No Maceration: No N/A Moisture: Moist: No Moist: No Dry/Scaly: No Dry/Scaly: No Periwound Skin Color: Erythema: Yes Atrophie Blanche: No N/A Mottled: Yes Cyanosis: No Atrophie Blanche: No Ecchymosis: No Cyanosis: No Erythema: No Ecchymosis: No Hemosiderin Staining: No Hemosiderin Staining: No Mottled: No Pallor: No Pallor: No Rubor: No Rubor: No Erythema Location: Circumferential N/A N/A Temperature: No Abnormality No Abnormality N/A Tenderness on Yes Yes N/A Palpation: Wound Preparation: Ulcer Cleansing: Other: Ulcer Cleansing: Other: N/A soap and water soap and water Topical Anesthetic Topical Anesthetic Applied: Other: lidocaine Applied: None 4% Treatment Notes Electronic Signature(s) Signed: 06/29/2015 5:48:30 PM By: Alric Quan Entered By: Alric Quan on 06/25/2015 16:01:08 Rozenberg, [name] ([id number]) -------------------------------------------------------------------------------- White Details Patient Name: Korson, Jacquelene C. Date of Service: 06/25/2015 3:30 PM Medical Record Number: [id number] Patient Account Number: [id number] Date of Birth/Sex: [birth year] (82 y.o. Female) Treating RN: Carolyne Fiscal, Debi Primary  Care Physician: Ria Bush Other Clinician: Referring Physician: Ria Bush Treating Physician/Extender: Frann Rider in Treatment: 41 Active Inactive Abuse / Safety / Falls / Self Care Management Nursing Diagnoses: Potential for falls Goals: Patient will remain injury free Date Initiated: 08/15/2014 Goal Status: Active Interventions: Assess fall risk on admission and as needed Notes: Nutrition Nursing Diagnoses: Impaired glucose control: actual or potential Goals: Patient/caregiver verbalizes understanding of need to maintain therapeutic glucose control per primary care physician Date Initiated: 08/15/2014 Goal Status: Active Interventions: Assess patient nutrition upon admission and as needed per policy Notes: Orientation to the Wound Care Program Nursing Diagnoses: Knowledge deficit related to the wound healing center program Goals: Patient/caregiver will verbalize understanding of the Felicity ABBYGAYLE, STANGL ([id number]) Date Initiated: 08/15/2014 Goal Status: Active Interventions: Provide education on orientation to the wound center Notes: Venous Leg Ulcer Nursing Diagnoses: Actual venous Insuffiency (use after diagnosis is confirmed) Goals: Patient will maintain optimal edema control Date Initiated: 08/15/2014 Goal Status: Active Interventions: Compression as ordered Treatment Activities: Test ordered outside of clinic : 06/25/2015 Notes: Wound/Skin Impairment Nursing Diagnoses: Impaired  tissue integrity Goals: Patient/caregiver will verbalize understanding of skin care regimen Date Initiated: 08/15/2014 Goal Status: Active Interventions: Assess patient/caregiver ability to obtain necessary supplies Notes: Electronic Signature(s) Signed: 06/29/2015 5:48:30 PM By: Alric Quan Entered By: Alric Quan on 06/25/2015 16:00:52 Amores, [name]  ([id number]) -------------------------------------------------------------------------------- Pain Assessment Details Patient Name: Frazer, Devin C. Date of Service: 06/25/2015 3:30 PM Medical Record Number: [id number] Patient Account Number: [id number] Date of Birth/Sex: 1927-10-09 (82 y.o. Female) Treating RN: Carolyne Fiscal, Debi Primary Care Physician: Ria Bush Other Clinician: Referring Physician: Ria Bush Treating Physician/Extender: Frann Rider in Treatment: 19 Active Problems Location of Pain Severity and Description of Pain Patient Has [name] No Site Locations Pain Management and Medication Current Pain Management: Electronic Signature(s) Signed: 06/29/2015 5:48:30 PM By: Alric Quan Entered By: Alric Quan on 06/25/2015 15:27:50 Torelli, [name] ([id number]) -------------------------------------------------------------------------------- Wound Assessment Details Patient Name: Biddle, Daylan C. Date of Service: 06/25/2015 3:30 PM Medical Record Number: [id number] Patient Account Number: [id number] Date of Birth/Sex: February 04, 1928 (82 y.o. Female) Treating RN: Carolyne Fiscal, Debi Primary Care Physician: Ria Bush Other Clinician: Referring Physician: Ria Bush Treating Physician/Extender: Frann Rider in Treatment: 89 Wound Status Wound Number: 1 Primary Venous Leg Ulcer Etiology: Wound Location: Right Lower Leg - Medial Wound Open Wounding Event: Gradually Appeared Status: Date Acquired: 04/04/2014 Comorbid Cataracts, Anemia, Coronary Artery Weeks Of Treatment: 44 History: Disease, Hypertension, Myocardial Clustered Wound: No Infarction, Type II Diabetes, Osteoarthritis Photos Photo Uploaded By: Alric Quan on 06/25/2015 16:28:30 Wound Measurements Length: (cm) 0.1 Width: (cm) 0.1 Depth: (cm) 0.1 Area: (cm) 0.008 Volume: (cm) 0.001 % Reduction in Area: 99.9% % Reduction in Volume:  99.9% Epithelialization: Medium (34-66%) Tunneling: No Undermining: No Wound Description Full Thickness Without Classification: Exposed Support Structures Diabetic Severity Grade 1 (Wagner): Wound Margin: Distinct, outline attached Exudate Amount: None Present Foul Odor After Cleansing: No Wound Bed Granulation Amount: Large (67-100%) Exposed Structure Granulation Quality: Red, Pink Fascia Exposed: No Fontanez, Lariah C. ([id number]) Necrotic Amount: None Present (0%) Fat Layer Exposed: No Tendon Exposed: No Muscle Exposed: No Joint Exposed: No Bone Exposed: No Limited to Skin Breakdown Periwound Skin Texture Texture Color No Abnormalities Noted: No No Abnormalities Noted: No Callus: No Atrophie Blanche: No Crepitus: No Cyanosis: No Excoriation: No Ecchymosis: No Fluctuance: No Erythema: Yes Friable: No Erythema Location: Circumferential Induration: No Hemosiderin Staining: No Localized Edema: Yes Mottled: Yes Rash: No Pallor: No Scarring: No Rubor: No Moisture Temperature / Pain No Abnormalities Noted: No Temperature: No Abnormality Dry / Scaly: No Tenderness on Palpation: Yes Maceration: No Moist: No Wound Preparation Ulcer Cleansing: Other: soap and water, Topical Anesthetic Applied: Other: lidocaine 4%, Electronic Signature(s) Signed: 06/29/2015 5:48:30 PM By: Alric Quan Entered By: Alric Quan on 06/25/2015 15:50:35 Querry, [name] ([id number]) -------------------------------------------------------------------------------- Wound Assessment Details Patient Name: Mages, Hind C. Date of Service: 06/25/2015 3:30 PM Medical Record Number: [id number] Patient Account Number: [id number] Date of Birth/Sex: 1927-07-07 (82 y.o. Female) Treating RN: Carolyne Fiscal, Debi Primary Care Physician: Ria Bush Other Clinician: Referring Physician: Ria Bush Treating Physician/Extender: Frann Rider in Treatment: 59 Wound  Status Wound Number: 3 Primary Venous Leg Ulcer Etiology: Wound Location: Right Lower Leg - Medial, Proximal Wound Healed - Epithelialized Status: Wounding Event: Gradually Appeared Comorbid Cataracts, Anemia, Coronary Artery Date Acquired: 05/15/2015 History: Disease, Hypertension, Myocardial Weeks Of Treatment: 5 Infarction, Type II Diabetes, Clustered Wound: No Osteoarthritis Photos Photo Uploaded By: Alric Quan on 06/25/2015 16:28:49 Wound Measurements Length: (cm) 0 % Reduction Width: (cm) 0 % Reduction Depth: (cm) 0 Epitheliali  Area: (cm) 0 Tunneling: Volume: (cm) 0 Underminin in Area: 100% in Volume: 100% zation: Large (67-100%) No g: No Wound Description Classification: Partial Thickness Foul Odor A Diabetic Severity (Wagner): Grade 1 Wound Margin: Flat and Intact Exudate Amount: Large Exudate Type: Serous Exudate Color: amber fter Cleansing: No Wound Bed Granulation Amount: None Present (0%) Exposed Structure Necrotic Amount: None Present (0%) Fascia Exposed: No Caine, Alysiah C. ([id number]) Fat Layer Exposed: No Tendon Exposed: No Muscle Exposed: No Joint Exposed: No Bone Exposed: No Limited to Skin Breakdown Periwound Skin Texture Texture Color No Abnormalities Noted: No No Abnormalities Noted: No Callus: No Atrophie Blanche: No Crepitus: No Cyanosis: No Excoriation: No Ecchymosis: No Fluctuance: No Erythema: No Friable: No Hemosiderin Staining: No Induration: No Mottled: No Localized Edema: No Pallor: No Rash: No Rubor: No Scarring: No Temperature / Pain Moisture Temperature: No Abnormality No Abnormalities Noted: No Tenderness on Palpation: Yes Dry / Scaly: No Maceration: No Moist: No Wound Preparation Ulcer Cleansing: Other: soap and water, Topical Anesthetic Applied: None Electronic Signature(s) Signed: 06/29/2015 5:48:30 PM By: Alric Quan Entered By: Alric Quan on 06/25/2015 15:42:01 Latouche, [name] ([id number]) -------------------------------------------------------------------------------- Vitals Details Patient Name: Hovland, Malaka C. Date of Service: 06/25/2015 3:30 PM Medical Record Number: [id number] Patient Account Number: [id number] Date of Birth/Sex: 10/06/1927 (82 y.o. Female) Treating RN: Carolyne Fiscal, Debi Primary Care Physician: Ria Bush Other Clinician: Referring Physician: Ria Bush Treating Physician/Extender: Frann Rider in Treatment: 44 Vital Signs Time Taken: 15:27 Temperature (F): 97.7 Height (in): 58 Pulse (bpm): 74 Weight (lbs): 151 Respiratory Rate (breaths/min): 18 Body Mass Index (BMI): 31.6 Blood Pressure (mmHg): 122/58 Reference Range: 80 - 120 mg / dl Electronic Signature(s) Signed: 06/29/2015 5:48:30 PM By: Alric Quan Entered By: Alric Quan on 06/25/2015 15:29:52

## 2015-07-03 ENCOUNTER — Encounter: Payer: Medicare Other | Admitting: Surgery

## 2015-07-03 DIAGNOSIS — S41112A Laceration without foreign body of left upper arm, initial encounter: Secondary | ICD-10-CM | POA: Diagnosis not present

## 2015-07-03 DIAGNOSIS — I87331 Chronic venous hypertension (idiopathic) with ulcer and inflammation of right lower extremity: Secondary | ICD-10-CM | POA: Diagnosis not present

## 2015-07-03 DIAGNOSIS — Z951 Presence of aortocoronary bypass graft: Secondary | ICD-10-CM | POA: Diagnosis not present

## 2015-07-03 DIAGNOSIS — E11610 Type 2 diabetes mellitus with diabetic neuropathic arthropathy: Secondary | ICD-10-CM | POA: Diagnosis not present

## 2015-07-03 DIAGNOSIS — Z872 Personal history of diseases of the skin and subcutaneous tissue: Secondary | ICD-10-CM | POA: Diagnosis not present

## 2015-07-03 DIAGNOSIS — E11622 Type 2 diabetes mellitus with other skin ulcer: Secondary | ICD-10-CM | POA: Diagnosis not present

## 2015-07-03 DIAGNOSIS — I10 Essential (primary) hypertension: Secondary | ICD-10-CM | POA: Diagnosis not present

## 2015-07-03 DIAGNOSIS — Z09 Encounter for follow-up examination after completed treatment for conditions other than malignant neoplasm: Secondary | ICD-10-CM | POA: Diagnosis not present

## 2015-07-04 NOTE — Progress Notes (Addendum)
[name] ([id number]) Visit Report for 07/03/2015 Chief Complaint Document Details Patient Name: [name], [name] Date of Service: 07/03/2015 2:45 PM Medical Record Number: [id number] Patient Account Number: [id number] Date of Birth/Sex: March 15, 1928 (82 y.o. Female) Treating RN: Carolyne Fiscal, Debi Primary Care Physician: Ria Bush Other Clinician: Referring Physician: Ria Bush Treating Physician/Extender: Frann Rider in Treatment: 46 Information Obtained from: Patient Chief Complaint Patient presents for treatment of an open ulcer due to venous insufficiency. 82 year old patient who is known to me from the [facility] wound care center now comes in follow up here for a chronic venous ulceration of her right lower extremity which she's had for about 6 months. Electronic Signature(s) Signed: 07/03/2015 3:01:28 PM By: Christin Fudge MD, FACS Entered By: Christin Fudge on 07/03/2015 15:01:27 [name] ([id number]) -------------------------------------------------------------------------------- HPI Details Patient Name: [name], [name] Date of Service: 07/03/2015 2:45 PM Medical Record Number: [id number] Patient Account Number: [id number] Date of Birth/Sex: Nov 29, 1927 (82 y.o. Female) Treating RN: Carolyne Fiscal, Debi Primary Care Physician: Ria Bush Other Clinician: Referring Physician: Ria Bush Treating Physician/Extender: Frann Rider in Treatment: 46 History of Present Illness Location: ulcer in the right lower extremity for several months Quality: Patient reports experiencing a dull pain to affected area(s). Severity: Patient states wound (s) are getting better. Duration: Patient has had the wound for > 3 months prior to seeking treatment at the wound center Timing: Pain in wound is Intermittent (comes and goes Context: The wound appeared gradually over time Modifying Factors: Consults to this date include: vascular surgery  for the veins in December 2015 and she's been seeing the wound care center at [facility] for several months. Associated Signs and Symptoms: Patient reports having difficulty standing for long periods. HPI Description: This is an 82 yrs old female we have been following since the beginning of January for a wound on the right medial ankle. She had previously had venous ablation of the greater saphenous vein by Dr. Kellie Simmering. She came to [place] with erythema of the right leg and subsequently is opened in the medial aspect of the right ankle. She has not been very compliant with compression. We ordered her at juxta lite stocking, however she apparently has not been able to wear it. The area on the medial aspect of her left ankle has much less induration and erythema. The edema is better controlled. No evidence of infection is noted. 07/18/14; no major change in the condition of this wound. There is still surface slough and probably some drainage. 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 All other veins showed no evidence of DVT, superficial venous thrombosis or incompetence. however she does have manifestations of chronic venous hypertension and I believe we will change her dressing over to Prisma and a Profore light dressing. she sees her primary care doctor regularly and her last hemoglobin A1c has been around 7%.her other comorbidities are hypertension, CABG in 1998, peptic ulcer disease, history of echo with a ejection fraction of 55-60% and grade 1 diastolic dysfunction. past surgical history include coronary artery bypass graft in 1998, cholecystectomy, appendectomy, hand surgery, endovenous ablation of the saphenous vein with [name] by Dr. Kellie Simmering in December 2015. 08/29/2014 - she complains of increased calf pain over the past week. removed her compression bandage and pain  improved. no fever or chills. Mild clear drainage. 09/04/2014 -- she has not been compliant and  has not done a dressing change as we had ordered last week. She always complains and does not keep her wrap on and this past week she has done the same. She has completed a course of doxycycline. 09/25/2014 -- she has tolerated her dressing all week and has not removed it. She says it's hurting a lot today and would not like any debridement. We will do so as per her wishes. she did get a appointment with the vascular surgeon and sees him on 6/28. UMI, FRUGE ([id number]) 10/03/2014 - She was seen by Dr. Tinnie Gens 09/30/2014. Assessment: I reviewed the previous venous duplex exam study performed in September 2015 which reveals no options for treating this venous ulcer. There is no significant superficial venous reflux to treat with [name] ablation.Most recent ABI was 0.91 in the right foot with palpable dorsalis pedis pulse at 3+ Plan: patient needs to return to the [facility] wound center and be treated by Dr. Con Memos. No arterial or venous options available from vascular standpoint or treatment of right leg stasis ulcer.Continued dressing changes and compression is the treatment of choice 10/16/2014 -- today will be her first application of Apligraf. 10/23/2014 -- we are going to be able to apply her second day of Apligraf next week. She is complaining of some tenderness in the region of Achilles tendon and this is possibly due to her wrap. 11/06/2014 -- she did not have Apligraf last week because the wound was not looking good but she says she is feeling much better now. 11/13/2014 -- she has been doing well this week and she is here for her second Apligraf. 11/27/2014 -- she is here for her third application of Apligraf 12/12/2014 -- she is here for her fourth application of Apligraf. Also during these past 3 days she had an injury to her left forearm which she caught against the edge of  her desk and has a lacerated wound which has gone down to the subcutaneous tissue. 12/26/2014 -- she is here for a last/fifth application of Apligraf. 04/10/2015 -- the patient is here for her first application of Puraply. 04/17/2015 -- the patient is here for her second application of Puraply. 04/24/2015 -- her Puraply was not ordered for today and hence he would use a collagen dressing locally. 05/01/2015 -- the patient is here for her third application of Puraply. 05/08/2015 -- the patient has been doing fine and overall has shown much improvement and is here for a routine visit 05/15/2015 -- she was admitted to the hospital with viral gastroenteritis and acute renal failure with dehydration and has been on supportive care since February 7. She was discharged today and has come for her wound care. Details of her hospital admission and therapy reviewed. Electronic Signature(s) Signed: 07/03/2015 3:01:44 PM By: Christin Fudge MD, FACS Entered By: Christin Fudge on 07/03/2015 15:01:44 [name] ([id number]) -------------------------------------------------------------------------------- Physical Exam Details Patient Name: [name], [name] Date of Service: 07/03/2015 2:45 PM Medical Record Number: [id number] Patient Account Number: [id number] Date of Birth/Sex: 13-Aug-1927 (82 y.o. Female) Treating RN: Carolyne Fiscal, Debi Primary Care Physician: Ria Bush Other Clinician: Referring Physician: Ria Bush Treating Physician/Extender: Frann Rider in Treatment: 46 Constitutional . Pulse regular. Respirations normal and unlabored. Afebrile. . Eyes Nonicteric. Reactive to light. Ears, Nose, Mouth, and Throat Lips, teeth, and gums WNL.Marland Kitchen Moist mucosa without lesions. Neck supple and nontender. No palpable supraclavicular or cervical adenopathy. Normal sized without goiter. Respiratory WNL. No retractions.. Cardiovascular Pedal Pulses WNL. No clubbing, cyanosis or  edema. Lymphatic No adneopathy. No adenopathy. No adenopathy. Musculoskeletal Adexa without tenderness or enlargement.. Digits and nails w/o clubbing, cyanosis, infection, petechiae, ischemia, or inflammatory conditions.. Integumentary (Hair, Skin) No suspicious lesions. No crepitus or fluctuance. No peri-wound warmth or erythema. No masses.Marland Kitchen Psychiatric Judgement and insight Intact.. No evidence of depression, anxiety, or agitation.. Notes the patient's wound is healed completely and her right lower extremity is looking excellent with no evidence of lymphedema or cellulitis. Electronic Signature(s) Signed: 07/03/2015 3:02:15 PM By: Christin Fudge MD, FACS Entered By: Christin Fudge on 07/03/2015 15:02:15 Stoneman, [name] ([id number]) -------------------------------------------------------------------------------- Physician Orders Details Patient Name: [name], [name] Date of Service: 07/03/2015 2:45 PM Medical Record Number: [id number] Patient Account Number: [id number] Date of Birth/Sex: 1927-08-14 (82 y.o. Female) Treating RN: Carolyne Fiscal, Debi Primary Care Physician: Ria Bush Other Clinician: Referring Physician: Ria Bush Treating Physician/Extender: Frann Rider in Treatment: 81 Verbal / Phone Orders: Yes Clinician: Carolyne Fiscal, Debi Read Back and Verified: Yes Diagnosis Coding ICD-10 Coding Code Description E11.622 Type 2 diabetes mellitus with other skin ulcer E11.610 Type 2 diabetes mellitus with diabetic neuropathic arthropathy Chronic venous hypertension (idiopathic) with ulcer and inflammation of right lower I87.331 extremity Discharge From [facility] o Discharge from [place] lotion on your legs to keep them moisturized. Wear your compression stockings everyday, put them on first thing in the morning and take them off at night before bed. Call us if you need anything. Electronic Signature(s) Signed: 07/03/2015 4:02:38 PM  By: Christin Fudge MD, FACS Signed: 07/07/2015 4:28:40 PM By: Alric Quan Entered By: Alric Quan on 07/03/2015 15:08:32 Buckalew, [name] ([id number]) -------------------------------------------------------------------------------- Problem List Details Patient Name: [name], [name] Date of Service: 07/03/2015 2:45 PM Medical Record Number: [id number] Patient Account Number: [id number] Date of Birth/Sex: [birth year] (82 y.o. Female) Treating RN: Carolyne Fiscal, Debi Primary Care Physician: Ria Bush Other Clinician: Referring Physician: Ria Bush Treating Physician/Extender: Frann Rider in Treatment: 85 Active Problems ICD-10 Encounter Code Description Active Date Diagnosis E11.622 Type 2 diabetes mellitus with other skin ulcer 08/15/2014 Yes E11.610 Type 2 diabetes mellitus with diabetic neuropathic 08/15/2014 Yes arthropathy I87.331 Chronic venous hypertension (idiopathic) with ulcer and 08/15/2014 Yes inflammation of right lower extremity Inactive Problems Resolved Problems ICD-10 Code Description Active Date Resolved Date L03.115 Cellulitis of right lower limb 08/29/2014 08/29/2014 S41.112A Laceration without foreign body of left upper arm, initial 12/12/2014 12/12/2014 encounter Electronic Signature(s) Signed: 07/03/2015 3:01:17 PM By: Christin Fudge MD, FACS Entered By: Christin Fudge on 07/03/2015 15:01:17 Dales, [name] ([id number]) -------------------------------------------------------------------------------- Progress Note Details Patient Name: [name], [name] Date of Service: 07/03/2015 2:45 PM Medical Record Number: [id number] Patient Account Number: [id number] Date of Birth/Sex: 1927-06-05 (82 y.o. Female) Treating RN: Carolyne Fiscal, Debi Primary Care Physician: Ria Bush Other Clinician: Referring Physician: Ria Bush Treating Physician/Extender: Frann Rider in [id number] Subjective Chief Complaint Information  obtained from Patient Patient presents for treatment of an open ulcer due to venous insufficiency. 82 year old patient who is known to me from the [facility] wound care center now comes in follow up here for a chronic venous ulceration of her right lower extremity which she's had for about 6 months. History of Present Illness (HPI) The following HPI elements were documented for the patient's wound: Location: ulcer in the right lower extremity for several months Quality: Patient reports experiencing a dull pain to affected area(s). Severity: Patient states wound (s) are getting better. Duration: Patient has had the wound for > 3 months prior to seeking treatment at the  wound center Timing: Pain in wound is Intermittent (comes and goes Context: The wound appeared gradually over time Modifying Factors: Consults to this date include: vascular surgery for the veins in December 2015 and she's been seeing the wound care center at [facility] for several months. Associated Signs and Symptoms: Patient reports having difficulty standing for long periods. This is an 82 yrs old female we have been following since the beginning of January for a wound on the right medial ankle. She had previously had venous ablation of the greater saphenous vein by Dr. Kellie Simmering. She came to [place] with erythema of the right leg and subsequently is opened in the medial aspect of the right ankle. She has not been very compliant with compression. We ordered her at juxta lite stocking, however she apparently has not been able to wear it. The area on the medial aspect of her left ankle has much less induration and erythema. The edema is better controlled. No evidence of infection is noted. 07/18/14; no major change in the condition of this wound. There is still surface slough and probably some drainage. The patient had a venous duplex study done on 04/02/2014 and at that stage it showed that she had a chronic thrombus in one of her for  proximal gastrocnemius veins. All other veins showed no evidence of DVT, superficial venous thrombosis or incompetence. however she does have manifestations of chronic venous hypertension and I believe we will change her dressing over to Prisma and a Profore light dressing. she sees her primary care doctor regularly and her last hemoglobin A1c has been around 7%.her other comorbidities are hypertension, CABG in 1998, peptic ulcer disease, history of echo with a ejection fraction of 55-60% and grade 1 diastolic dysfunction. past surgical history include coronary artery bypass graft in 1998, cholecystectomy, appendectomy, hand surgery, endovenous ablation of the saphenous vein with [name] by Dr. Kellie Simmering in December 2015. [name], [name] ([id number]) 08/29/2014 - she complains of increased calf pain over the past week. removed her compression bandage and pain improved. no fever or chills. Mild clear drainage. 09/04/2014 -- she has not been compliant and has not done a dressing change as we had ordered last week. She always complains and does not keep her wrap on and this past week she has done the same. She has completed a course of doxycycline. 09/25/2014 -- she has tolerated her dressing all week and has not removed it. She says it's hurting a lot today and would not like any debridement. We will do so as per her wishes. she did get a appointment with the vascular surgeon and sees him on 6/28. 10/03/2014 - She was seen by Dr. Tinnie Gens 09/30/2014. Assessment: I reviewed the previous venous duplex exam study performed in September 2015 which reveals no options for treating this venous ulcer. There is no significant superficial venous reflux to treat with [name] ablation.Most recent ABI was 0.91 in the right foot with palpable dorsalis pedis pulse at 3+ Plan: patient needs to return to the [facility] wound center and be treated by Dr. Con Memos. No arterial or venous options available from vascular  standpoint or treatment of right leg stasis ulcer.Continued dressing changes and compression is the treatment of choice 10/16/2014 -- today will be her first application of Apligraf. 10/23/2014 -- we are going to be able to apply her second day of Apligraf next week. She is complaining of some tenderness in the region of Achilles tendon and this is possibly due to her wrap. 11/06/2014 --  she did not have Apligraf last week because the wound was not looking good but she says she is feeling much better now. 11/13/2014 -- she has been doing well this week and she is here for her second Apligraf. 11/27/2014 -- she is here for her third application of Apligraf 12/12/2014 -- she is here for her fourth application of Apligraf. Also during these past 3 days she had an injury to her left forearm which she caught against the edge of her desk and has a lacerated wound which has gone down to the subcutaneous tissue. 12/26/2014 -- she is here for a last/fifth application of Apligraf. 04/10/2015 -- the patient is here for her first application of Puraply. 04/17/2015 -- the patient is here for her second application of Puraply. 04/24/2015 -- her Puraply was not ordered for today and hence he would use a collagen dressing locally. 05/01/2015 -- the patient is here for her third application of Puraply. 05/08/2015 -- the patient has been doing fine and overall has shown much improvement and is here for a routine visit 05/15/2015 -- she was admitted to the hospital with viral gastroenteritis and acute renal failure with dehydration and has been on supportive care since February 7. She was discharged today and has come for her wound care. Details of her hospital admission and therapy reviewed. Objective Constitutional Pulse regular. Respirations normal and unlabored. Afebrile. [name], [name] ([id number]) Vitals Time Taken: 2:39 PM, Height: 58 in, Weight: 151 lbs, BMI: 31.6, Temperature: 97.7 F, Pulse:  72 bpm, Respiratory Rate: 18 breaths/min, Blood Pressure: 133/67 mmHg. Eyes Nonicteric. Reactive to light. Ears, Nose, Mouth, and Throat Lips, teeth, and gums WNL.Marland Kitchen Moist mucosa without lesions. Neck supple and nontender. No palpable supraclavicular or cervical adenopathy. Normal sized without goiter. Respiratory WNL. No retractions.. Cardiovascular Pedal Pulses WNL. No clubbing, cyanosis or edema. Lymphatic No adneopathy. No adenopathy. No adenopathy. Musculoskeletal Adexa without tenderness or enlargement.. Digits and nails w/o clubbing, cyanosis, infection, petechiae, ischemia, or inflammatory conditions.Marland Kitchen Psychiatric Judgement and insight Intact.. No evidence of depression, anxiety, or agitation.. General Notes: the patient's wound is healed completely and her right lower extremity is looking excellent with no evidence of lymphedema or cellulitis. Integumentary (Hair, Skin) No suspicious lesions. No crepitus or fluctuance. No peri-wound warmth or erythema. No masses.. Wound #1 status is Open. Original cause of wound was Gradually Appeared. The wound is located on the Right,Medial Lower Leg. The wound measures 0cm length x 0cm width x 0cm depth; 0cm^2 area and 0cm^3 volume. The wound is limited to skin breakdown. There is no tunneling or undermining noted. There is a none present amount of drainage noted. The wound margin is distinct with the outline attached to the wound base. There is no granulation within the wound bed. There is no necrotic tissue within the wound bed. The periwound skin appearance did not exhibit: Callus, Crepitus, Excoriation, Fluctuance, Friable, Induration, Localized Edema, Rash, Scarring, Dry/Scaly, Maceration, Moist, Atrophie Blanche, Cyanosis, Ecchymosis, Hemosiderin Staining, Mottled, Pallor, Rubor, Erythema. Periwound temperature was noted as No Abnormality. The periwound has tenderness on palpation. Assessment [name], [name] ([id number]) Active  Problems ICD-10 E11.622 - Type 2 diabetes mellitus with other skin ulcer E11.610 - Type 2 diabetes mellitus with diabetic neuropathic arthropathy I87.331 - Chronic venous hypertension (idiopathic) with ulcer and inflammation of right lower extremity Plan Discharge From [facility]: Discharge from [place] lotion on your legs to keep them moisturized. Wear your compression stockings everyday, put them on first thing in  the morning and take them off at night before bed. Call us if you need anything. The wound is completely healed and at this stage I have asked her to use compression stockings which was supposed to come to her home. Luckily we have a sample of juxta light of her size and we will give this to her. I have discussed elevation and exercise and the patient will continue to use her compression stocking or wrap for the rest of her life. She is discharged from the wound care services today Electronic Signature(s) Signed: 07/03/2015 4:04:29 PM By: Christin Fudge MD, FACS Previous Signature: 07/03/2015 3:04:44 PM Version By: Christin Fudge MD, FACS Entered By: Christin Fudge on 07/03/2015 16:04:29 [name] ([id number]) -------------------------------------------------------------------------------- SuperBill Details Patient Name: [name], [name] Date of Service: 07/03/2015 Medical Record Number: [id number] Patient Account Number: [id number] Date of Birth/Sex: 1927-11-29 (82 y.o. Female) Treating RN: Carolyne Fiscal, Debi Primary Care Physician: Ria Bush Other Clinician: Referring Physician: Ria Bush Treating Physician/Extender: Frann Rider in Treatment: 46 Diagnosis Coding ICD-10 Codes Code Description E11.622 Type 2 diabetes mellitus with other skin ulcer E11.610 Type 2 diabetes mellitus with diabetic neuropathic arthropathy Chronic venous hypertension (idiopathic) with ulcer and inflammation of right  lower I87.331 extremity Facility Procedures CPT4 Code: [id number] Description: [id number] - WOUND CARE VISIT-LEV 2 EST PT Modifier: Quantity: 1 Physician Procedures CPT4: Description Modifier Quantity Code [id number] - WC PHYS LEVEL 3 - EST PT 1 ICD-10 Description Diagnosis E11.622 Type 2 diabetes mellitus with other skin ulcer E11.610 Type 2 diabetes mellitus with diabetic neuropathic arthropathy I87.331 Chronic  venous hypertension (idiopathic) with ulcer and inflammation of right lower extremity Electronic Signature(s) Signed: 07/03/2015 4:02:38 PM By: Christin Fudge MD, FACS Signed: 07/07/2015 4:28:40 PM By: Alric Quan Previous Signature: 07/03/2015 3:04:57 PM Version By: Christin Fudge MD, FACS Entered By: Alric Quan on 07/03/2015 15:32:26

## 2015-07-08 ENCOUNTER — Telehealth: Payer: Self-pay | Admitting: Family Medicine

## 2015-07-08 NOTE — Telephone Encounter (Signed)
Patient Name: [name] DOB: 07-27-1927 Initial Comment Caller states having pain in her leg, wound is healed but still hurts Nurse Assessment Nurse: Ronnald Ramp, RN, Miranda Date/Time (Eastern Time): 07/08/2015 1:13:09 PM Confirm and document reason for call. If symptomatic, describe symptoms. You must click the next button to save text entered. ---Caller states she was treated for a venous ulcer on her right ankle and was released from wound care on Friday. She states the pain is still there. Has the patient traveled out of the country within the last 30 days? ---Not Applicable Does the patient have any new or worsening symptoms? ---Yes Will a triage be completed? ---Yes Related visit to physician within the last 2 weeks? ---Yes Does the PT have any chronic conditions? (i.e. diabetes, asthma, etc.) ---Yes List chronic conditions. ---Diabetes Is this a behavioral health or substance abuse call? ---No Guidelines Guideline Title Affirmed Question Affirmed Notes Rash or Redness - Localized [1] Localized rash is very painful AND [2] no fever Final Disposition User See Physician within 24 Hours Jones, RN, Miranda Comments Attempted to scheduled appt with Danise Mina, but alert pops up that acute appts for this patient are not allowed. Called office and spoke with [name] and she states she is not able to open extra time and a message will have to be sent through [facility] for the MD. Told caller I would forward the message to the MD and ask the he or his nurse call her back regarding symptoms and appt availability. If she does not feel she can wait, she can contact the wound care center for an appt or go to the [facility] Referrals REFERRED TO PCP OFFICE Disagree/Comply: Comply

## 2015-07-08 NOTE — Telephone Encounter (Signed)
Ok to do this. Thanks. 

## 2015-07-08 NOTE — Progress Notes (Signed)
MARVELYN, PLATER ([id number]) Visit Report for 07/03/2015 Arrival Information Details Patient Name: Caggiano, Anasophia C. Date of Service: 07/03/2015 2:45 PM Medical Record Number: [id number] Patient Account Number: [id number] Date of Birth/Sex: [birth year] (82 y.o. Female) Treating RN: Carolyne Fiscal, Debi Primary Care Physician: Ria Bush Other Clinician: Referring Physician: Ria Bush Treating Physician/Extender: Frann Rider in Treatment: 46 Visit Information History Since Last Visit All ordered tests and consults were completed: No Patient Arrived: Walker Added or deleted any medications: No Arrival Time: 14:38 Any new allergies or adverse reactions: No Accompanied By: self Had a fall or experienced change in No Transfer Assistance: None activities of daily living that may affect Patient Identification Verified: Yes risk of falls: Secondary Verification Process Yes Signs or symptoms of abuse/neglect since last No Completed: visito Patient Requires Transmission- No Hospitalized since last visit: No Based Precautions: Pain Present Now: No Patient Has Alerts: Yes Patient Alerts: Patient on Blood Thinner Type II Diabetic 81 MG aspirin Electronic Signature(s) Signed: 07/07/2015 4:28:40 PM By: Alric Quan Entered By: Alric Quan on 07/03/2015 14:39:41 Seely, [name] ([id number]) -------------------------------------------------------------------------------- Clinic Level of Care Assessment Details Patient Name: Dayley, Ciarra C. Date of Service: 07/03/2015 2:45 PM Medical Record Number: [id number] Patient Account Number: [id number] Date of Birth/Sex: 03-08-1928 (82 y.o. Female) Treating RN: Carolyne Fiscal, Debi Primary Care Physician: Ria Bush Other Clinician: Referring Physician: Ria Bush Treating Physician/Extender: Frann Rider in Treatment: 42 Clinic Level of Care Assessment Items TOOL 4 Quantity Score []  - Use when only  an EandM is performed on FOLLOW-UP visit 0 ASSESSMENTS - Nursing Assessment / Reassessment []  - Reassessment of Co-morbidities (includes updates in patient status) 0 X - Reassessment of Adherence to Treatment Plan 1 5 ASSESSMENTS - Wound and Skin Assessment / Reassessment X - Simple Wound Assessment / Reassessment - one wound 1 5 []  - Complex Wound Assessment / Reassessment - multiple wounds 0 []  - Dermatologic / Skin Assessment (not related to wound area) 0 ASSESSMENTS - Focused Assessment X - Circumferential Edema Measurements - multi extremities 1 5 []  - Nutritional Assessment / Counseling / Intervention 0 []  - Lower Extremity Assessment (monofilament, tuning fork, pulses) 0 []  - Peripheral Arterial Disease Assessment (using hand held doppler) 0 ASSESSMENTS - Ostomy and/or Continence Assessment and Care []  - Incontinence Assessment and Management 0 []  - Ostomy Care Assessment and Management (repouching, etc.) 0 PROCESS - Coordination of Care X - Simple Patient / Family Education for ongoing care 1 15 []  - Complex (extensive) Patient / Family Education for ongoing care 0 []  - Staff obtains Programmer, systems, Records, Test Results / Process Orders 0 []  - Staff telephones HHA, Nursing Homes / Clarify orders / etc 0 []  - Routine Transfer to another Facility (non-emergent condition) 0 Beckstrand, Anorah C. ([id number]) []  - Routine Hospital Admission (non-emergent condition) 0 []  - New Admissions / Biomedical engineer / Ordering NPWT, Apligraf, etc. 0 []  - Emergency Hospital Admission (emergent condition) 0 []  - Simple Discharge Coordination 0 X - Complex (extensive) Discharge Coordination 1 15 PROCESS - Special Needs []  - Pediatric / Minor Patient Management 0 []  - Isolation Patient Management 0 []  - Hearing / Language / Visual special needs 0 []  - Assessment of Community assistance (transportation, D/C planning, etc.) 0 []  - Additional assistance / Altered mentation 0 []  - Support  Surface(s) Assessment (bed, cushion, seat, etc.) 0 INTERVENTIONS - Wound Cleansing / Measurement []  - Simple Wound Cleansing - one wound 0 X - Complex Wound Cleansing - multiple wounds 1 5 []  -  Wound Imaging (photographs - any number of wounds) 0 X - Wound Tracing (instead of photographs) 1 5 []  - Simple Wound Measurement - one wound 0 []  - Complex Wound Measurement - multiple wounds 0 INTERVENTIONS - Wound Dressings []  - Small Wound Dressing one or multiple wounds 0 []  - Medium Wound Dressing one or multiple wounds 0 []  - Large Wound Dressing one or multiple wounds 0 []  - Application of Medications - topical 0 []  - Application of Medications - injection 0 INTERVENTIONS - Miscellaneous []  - External ear exam 0 Baik, Geralda C. ([id number]) []  - Specimen Collection (cultures, biopsies, blood, body fluids, etc.) 0 []  - Specimen(s) / Culture(s) sent or taken to Lab for analysis 0 []  - Patient Transfer (multiple staff / Harrel Lemon Lift / Similar devices) 0 []  - Simple Staple / Suture removal (25 or less) 0 []  - Complex Staple / Suture removal (26 or more) 0 []  - Hypo / Hyperglycemic Management (close monitor of Blood Glucose) 0 []  - Ankle / Brachial Index (ABI) - do not check if billed separately 0 X - Vital Signs 1 5 Has the patient been seen at the hospital within the last three years: Yes Total Score: 60 Level Of Care: New/Established - Level 2 Electronic Signature(s) Signed: 07/07/2015 4:28:40 PM By: Alric Quan Entered By: Alric Quan on 07/03/2015 15:25:59 Drost, [name] ([id number]) -------------------------------------------------------------------------------- Encounter Discharge Information Details Patient Name: Merkel, Hillery C. Date of Service: 07/03/2015 2:45 PM Medical Record Number: [id number] Patient Account Number: [id number] Date of Birth/Sex: 01/05/1928 (82 y.o. Female) Treating RN: Carolyne Fiscal, Debi Primary Care Physician: Ria Bush Other  Clinician: Referring Physician: Ria Bush Treating Physician/Extender: Frann Rider in Treatment: 59 Encounter Discharge Information Items Discharge Pain Level: 0 Discharge Condition: Stable Ambulatory Status: Walker Discharge Destination: Home Transportation: Private Auto Accompanied By: son Schedule Follow-up Appointment: No Medication Reconciliation completed Yes and provided to Patient/Care Carlisia Geno: Provided on Clinical Summary of Care: 07/03/2015 Form Type Recipient Paper Patient LL Electronic Signature(s) Signed: 07/03/2015 3:14:04 PM By: Ruthine Dose Entered By: Ruthine Dose on 07/03/2015 15:14:03 Fredericksen, [name] ([id number]) -------------------------------------------------------------------------------- Lower Extremity Assessment Details Patient Name: Pieratt, Adaira C. Date of Service: 07/03/2015 2:45 PM Medical Record Number: [id number] Patient Account Number: [id number] Date of Birth/Sex: Sep 09, 1927 (82 y.o. Female) Treating RN: Carolyne Fiscal, Debi Primary Care Physician: Ria Bush Other Clinician: Referring Physician: Ria Bush Treating Physician/Extender: Frann Rider in Treatment: 46 Edema Assessment Assessed: [Left: No] [Right: No] E[Left: dema] [Right: :] Calf Left: Right: Point of Measurement: cm From Medial Instep cm 25.4 cm Ankle Left: Right: Point of Measurement: cm From Medial Instep cm 18 cm Vascular Assessment Pulses: Posterior Tibial Dorsalis Pedis Palpable: [Right:Yes] Extremity colors, hair growth, and conditions: Extremity Color: [Right:Hyperpigmented] Temperature of Extremity: [Right:Warm] Capillary Refill: [Right:< 3 seconds] Electronic Signature(s) Signed: 07/07/2015 4:28:40 PM By: Alric Quan Entered By: Alric Quan on 07/03/2015 14:57:42 Inlow, Chanae C. ([id number]) -------------------------------------------------------------------------------- Multi Wound Chart Details Patient  Name: Ratchford, Accalia C. Date of Service: 07/03/2015 2:45 PM Medical Record Number: [id number] Patient Account Number: [id number] Date of Birth/Sex: November 04, 1927 (82 y.o. Female) Treating RN: Carolyne Fiscal, Debi Primary Care Physician: Ria Bush Other Clinician: Referring Physician: Ria Bush Treating Physician/Extender: Frann Rider in Treatment: 46 Vital Signs Height(in): 58 Pulse(bpm): 72 Weight(lbs): 151 Blood Pressure 133/67 (mmHg): Body Mass Index(BMI): 32 Temperature(F): 97.7 Respiratory Rate 18 (breaths/min): Photos: [1:No Photos] [N/A:N/A] Wound Location: [1:Right Lower Leg - Medial] [N/A:N/A] Wounding Event: [1:Gradually Appeared] [N/A:N/A] Primary Etiology: [1:Venous Leg Ulcer] [N/A:N/A] Comorbid History: [1:Cataracts,  Anemia, Coronary Artery Disease, Hypertension, Myocardial Infarction, Type II Diabetes, Osteoarthritis] [N/A:N/A] Date Acquired: [1:04/04/2014] [N/A:N/A] Weeks of Treatment: [1:46] [N/A:N/A] Wound Status: [1:Open] [N/A:N/A] Measurements L x W x D 0x0x0 [N/A:N/A] (cm) Area (cm) : [1:0] [N/A:N/A] Volume (cm) : [1:0] [N/A:N/A] % Reduction in Area: [1:100.00%] [N/A:N/A] % Reduction in Volume: 100.00% [N/A:N/A] Classification: [1:Full Thickness Without Exposed Support Structures] [N/A:N/A] HBO Classification: [1:Grade 1] [N/A:N/A] Exudate Amount: [1:None Present] [N/A:N/A] Wound Margin: [1:Distinct, outline attached] [N/A:N/A] Granulation Amount: [1:None Present (0%)] [N/A:N/A] Necrotic Amount: [1:None Present (0%)] [N/A:N/A] Exposed Structures: [1:Fascia: No Fat: No Tendon: No Muscle: No] [N/A:N/A] Joint: No Bone: No Limited to Skin Breakdown Epithelialization: Large (67-100%) N/A N/A Periwound Skin Texture: Edema: No N/A N/A Excoriation: No Induration: No Callus: No Crepitus: No Fluctuance: No Friable: No Rash: No Scarring: No Periwound Skin Maceration: No N/A N/A Moisture: Moist: No Dry/Scaly: No Periwound Skin  Color: Atrophie Blanche: No N/A N/A Cyanosis: No Ecchymosis: No Erythema: No Hemosiderin Staining: No Mottled: No Pallor: No Rubor: No Temperature: No Abnormality N/A N/A Tenderness on Yes N/A N/A Palpation: Wound Preparation: Ulcer Cleansing: Other: N/A N/A soap and water Topical Anesthetic Applied: None Treatment Notes Electronic Signature(s) Signed: 07/07/2015 4:28:40 PM By: Alric Quan Entered By: Alric Quan on 07/03/2015 15:06:43 Negash, [name] ([id number]) -------------------------------------------------------------------------------- [place] Details Patient Name: Conkey, Ernisha C. Date of Service: 07/03/2015 2:45 PM Medical Record Number: [id number] Patient Account Number: [id number] Date of Birth/Sex: [birth year] (82 y.o. Female) Treating RN: Carolyne Fiscal, Debi Primary Care Physician: Ria Bush Other Clinician: Referring Physician: Ria Bush Treating Physician/Extender: Frann Rider in Treatment: 62 Active Inactive Electronic Signature(s) Signed: 07/07/2015 4:28:40 PM By: Alric Quan Entered By: Alric Quan on 07/03/2015 15:06:36 Fialkowski, [name] ([id number]) -------------------------------------------------------------------------------- Pain Assessment Details Patient Name: Kittell, Flordia C. Date of Service: 07/03/2015 2:45 PM Medical Record Number: [id number] Patient Account Number: [id number] Date of Birth/Sex: 09-23-1927 (82 y.o. Female) Treating RN: Carolyne Fiscal, Debi Primary Care Physician: Ria Bush Other Clinician: Referring Physician: Ria Bush Treating Physician/Extender: Frann Rider in Treatment: 46 Active Problems Location of Pain Severity and Description of Pain Patient Has [name] No Site Locations Pain Management and Medication Current Pain Management: Electronic Signature(s) Signed: 07/07/2015 4:28:40 PM By: Alric Quan Entered By: Alric Quan on  07/03/2015 14:39:48 Doss, [name] ([id number]) -------------------------------------------------------------------------------- Patient/Caregiver Education Details Patient Name: Antonson, Shenna C. Date of Service: 07/03/2015 2:45 PM Medical Record Number: [id number] Patient Account Number: [id number] Date of Birth/Gender: Oct 17, 1927 (82 y.o. Female) Treating RN: Carolyne Fiscal, Debi Primary Care Physician: Ria Bush Other Clinician: Referring Physician: Ria Bush Treating Physician/Extender: Frann Rider in Treatment: 55 Education Assessment Education Provided To: Patient Education Topics Provided Wound/Skin Impairment: Other: Put lotion on your legs to keep them moisturized. Wear your compression stockings Handouts: everyday Methods: Demonstration, Explain/Verbal Responses: State content correctly Electronic Signature(s) Signed: 07/07/2015 4:28:40 PM By: Alric Quan Entered By: Alric Quan on 07/03/2015 15:09:24 Morera, Braylinn C. ([id number]) -------------------------------------------------------------------------------- Wound Assessment Details Patient Name: Googe, Akasia C. Date of Service: 07/03/2015 2:45 PM Medical Record Number: [id number] Patient Account Number: [id number] Date of Birth/Sex: 10-24-1927 (82 y.o. Female) Treating RN: Carolyne Fiscal, Debi Primary Care Physician: Ria Bush Other Clinician: Referring Physician: Ria Bush Treating Physician/Extender: Frann Rider in Treatment: 34 Wound Status Wound Number: 1 Primary Venous Leg Ulcer Etiology: Wound Location: Right Lower Leg - Medial Wound Open Wounding Event: Gradually Appeared Status: Date Acquired: 04/04/2014 Comorbid Cataracts, Anemia, Coronary Artery Weeks Of Treatment: 46 History: Disease, Hypertension, Myocardial Clustered Wound: No Infarction, Type II  Diabetes, Osteoarthritis Photos Photo Uploaded By: Alric Quan on 07/03/2015 17:14:16 Wound  Measurements Length: (cm) 0 % Reductio Width: (cm) 0 % Reductio Depth: (cm) 0 Epithelial Area: (cm) 0 Tunneling Volume: (cm) 0 Undermini n in Area: 100% n in Volume: 100% ization: Large (67-100%) : No ng: No Wound Description Full Thickness Without Classification: Exposed Support Structures Diabetic Severity Grade 1 (Wagner): Wound Margin: Distinct, outline attached Exudate Amount: None Present Foul Odor After Cleansing: No Wound Bed Granulation Amount: None Present (0%) Exposed Structure Necrotic Amount: None Present (0%) Fascia Exposed: No Skarda, Kirsten C. ([id number]) Fat Layer Exposed: No Tendon Exposed: No Muscle Exposed: No Joint Exposed: No Bone Exposed: No Limited to Skin Breakdown Periwound Skin Texture Texture Color No Abnormalities Noted: No No Abnormalities Noted: No Callus: No Atrophie Blanche: No Crepitus: No Cyanosis: No Excoriation: No Ecchymosis: No Fluctuance: No Erythema: No Friable: No Hemosiderin Staining: No Induration: No Mottled: No Localized Edema: No Pallor: No Rash: No Rubor: No Scarring: No Temperature / Pain Moisture Temperature: No Abnormality No Abnormalities Noted: No Tenderness on Palpation: Yes Dry / Scaly: No Maceration: No Moist: No Wound Preparation Ulcer Cleansing: Other: soap and water, Topical Anesthetic Applied: None Electronic Signature(s) Signed: 07/07/2015 4:28:40 PM By: Alric Quan Entered By: Alric Quan on 07/03/2015 14:58:24 Kastens, Zephyra Loletha Grayer ([id number]) -------------------------------------------------------------------------------- Vitals Details Patient Name: Stanis, Kelsay C. Date of Service: 07/03/2015 2:45 PM Medical Record Number: [id number] Patient Account Number: [id number] Date of Birth/Sex: Nov 10, 1927 (82 y.o. Female) Treating RN: Carolyne Fiscal, Debi Primary Care Physician: Ria Bush Other Clinician: Referring Physician: Ria Bush Treating Physician/Extender:  Frann Rider in Treatment: 46 Vital Signs Time Taken: 14:39 Temperature (F): 97.7 Height (in): 58 Pulse (bpm): 72 Weight (lbs): 151 Respiratory Rate (breaths/min): 18 Body Mass Index (BMI): 31.6 Blood Pressure (mmHg): 133/67 Reference Range: 80 - 120 mg / dl Electronic Signature(s) Signed: 07/07/2015 4:28:40 PM By: Alric Quan Entered By: Alric Quan on 07/03/2015 14:40:23

## 2015-07-08 NOTE — Telephone Encounter (Signed)
I spoke with pt and she is having pain at site where had venous ulcer on rt ankle; area is completely healed but pt continues with pain in that area. Pain level now is 4. Pt called wound care and since was released last Fri pt was advised to contact PCP. Pt only wants to see Dr Danise Mina; Dr Darnell Level had appt on 07/09/15 at 1 pm for 15 mins. Mrs Ala's schedule will not allow for a 15 min visit; 1:00 is last appt of morning and the only available appt for Dr Darnell Level on 07/09/15 and Dr Synthia Innocent afternoon schedule does not start pts scheduled until 3 PM. Pt advised will tentatively schedule the 30 min appt at 1 pm on 07/09/15 depending on Dr Synthia Innocent approval. Pt voiced understanding. Pt will call office on 07/09/15 at 9 AM to confirm appt. FYI to Dr Darnell Level to get his permission about scheduling appt.

## 2015-07-08 NOTE — Telephone Encounter (Signed)
Home # remains busy.

## 2015-07-09 ENCOUNTER — Ambulatory Visit (INDEPENDENT_AMBULATORY_CARE_PROVIDER_SITE_OTHER): Payer: Medicare Other | Admitting: Family Medicine

## 2015-07-09 ENCOUNTER — Encounter: Payer: Self-pay | Admitting: Family Medicine

## 2015-07-09 VITALS — BP 126/80 | HR 84 | Temp 98.0°F | Wt 152.5 lb

## 2015-07-09 DIAGNOSIS — L97909 Non-pressure chronic ulcer of unspecified part of unspecified lower leg with unspecified severity: Secondary | ICD-10-CM

## 2015-07-09 DIAGNOSIS — I872 Venous insufficiency (chronic) (peripheral): Secondary | ICD-10-CM

## 2015-07-09 DIAGNOSIS — I2510 Atherosclerotic heart disease of native coronary artery without angina pectoris: Secondary | ICD-10-CM

## 2015-07-09 DIAGNOSIS — I83009 Varicose veins of unspecified lower extremity with ulcer of unspecified site: Secondary | ICD-10-CM

## 2015-07-09 DIAGNOSIS — L03115 Cellulitis of right lower limb: Secondary | ICD-10-CM

## 2015-07-09 MED ORDER — CEPHALEXIN 250 MG PO CAPS: 250.0000 mg | ORAL_CAPSULE | Freq: Three times a day (TID) | ORAL | Status: DC

## 2015-07-09 MED ORDER — HYDROCODONE-ACETAMINOPHEN 5-325 MG PO TABS: 0.5000 | ORAL_TABLET | Freq: Four times a day (QID) | ORAL | Status: DC | PRN

## 2015-07-09 NOTE — Assessment & Plan Note (Addendum)
R ankle wound finally healed. Appreciate wound clinic care of patient.

## 2015-07-09 NOTE — Patient Instructions (Addendum)
Take keflex 250mg  three times daily for the next week. Continue elevating legs, wear compression stockings as up to now. When ready, return to see Dr Kellie Simmering to discuss further treatment for your venous insufficiency.  Be very careful with your legs - walk slowly.

## 2015-07-09 NOTE — Progress Notes (Signed)
Pre visit review using our clinic review tool, if applicable. No additional management support is needed unless otherwise documented below in the visit note. 

## 2015-07-09 NOTE — Assessment & Plan Note (Signed)
Possible mild infection - treat with keflex 250mg  TID x 1 week. Update if not improving with treatment.

## 2015-07-09 NOTE — Progress Notes (Signed)
BP 126/80 mmHg  Pulse 84  Temp(Src) 98 F (36.7 C) (Oral)  Wt 152 lb 8 oz (69.174 kg)   CC: check ankle   Subjective:    [name], female    DOB: [birth year], 82 y.o.   MRN: [id number]  HPI: [name] is a 82 y.o. female presenting on 07/09/2015 for Ankle Pain   Followed until recently by wound center for chronic poorly healing ankle wound from CVI. Wound finally healed, she did need grafting of wound. Released from wound center on Friday. Noticing some redness mid anterior right leg over the last week.   She does wear light strength hose as well as heavier strength hose for 1/2 of the day.  She has hydrocodone for pain, takes 1/2 tab QID.   Relevant past medical, surgical, family and social history reviewed and updated as indicated. Interim medical history since our last visit reviewed. Allergies and medications reviewed and updated. Current Outpatient Prescriptions on File Prior to Visit  Medication Sig  . acidophilus (RISAQUAD) CAPS capsule Take 1 capsule by mouth daily.  Marland Kitchen ALPRAZolam (XANAX) 1 MG tablet Take 0.5 mg by mouth 4 (four) times daily as needed for anxiety.   Marland Kitchen aspirin EC 81 MG tablet Take 81 mg by mouth daily.  Marland Kitchen Dexlansoprazole (DEXILANT) 30 MG capsule Take 1 capsule (30 mg total) by mouth daily.  . ferrous sulfate 325 (65 FE) MG tablet Take 1 tablet (325 mg total) by mouth daily with breakfast.  . furosemide (LASIX) 20 MG tablet Take 20 mg by mouth daily.  Marland Kitchen glucosamine-chondroitin 500-400 MG tablet Take 1 tablet by mouth daily.   Marland Kitchen loperamide (IMODIUM) 2 MG capsule Take 2 mg by mouth 4 (four) times daily as needed for diarrhea or loose stools.  . nitroGLYCERIN (NITROSTAT) 0.4 MG SL tablet Place 0.4 mg under the tongue every 5 (five) minutes as needed for chest pain.   Marland Kitchen nystatin-triamcinolone ointment (MYCOLOG) Apply 1 application topically 2 (two) times daily as needed (for itching).  . pioglitazone (ACTOS) 30 MG tablet Take 30 mg by mouth  every evening.   . pravastatin (PRAVACHOL) 20 MG tablet Take 20 mg by mouth at bedtime.  . ramipril (ALTACE) 5 MG capsule Take 5 mg by mouth every evening.   . traZODone (DESYREL) 150 MG tablet Take 150 mg by mouth at bedtime.   . triamcinolone cream (KENALOG) 0.1 % APPLY 1 APPLICATION TOPICALLY 2 (TWO) TIMES DAILY.  . vitamin B-12 (CYANOCOBALAMIN) 1000 MCG tablet Take 1,000 mcg by mouth daily.  . vitamin E 400 UNIT capsule Take 400 Units by mouth daily.   No current facility-administered medications on file prior to visit.    Review of Systems Per HPI unless specifically indicated in ROS section     Objective:    BP 126/80 mmHg  Pulse 84  Temp(Src) 98 F (36.7 C) (Oral)  Wt 152 lb 8 oz (69.174 kg)  Wt Readings from Last 3 Encounters:  07/09/15 152 lb 8 oz (69.174 kg)  06/29/15 150 lb (68.04 kg)  06/16/15 147 lb 8 oz (66.906 kg)    Physical Exam  Constitutional: She appears well-developed and well-nourished. No distress.  Musculoskeletal: She exhibits no edema (mild nonpitting edema).  No open ulcer/sore of lower extremities. Mild anterior mid leg erythema without calor - new  Skin: Skin is warm and dry. No rash noted. There is erythema.  Nursing note and vitals reviewed.  Results for orders placed or performed in  visit on 06/29/15  Microalbumin / creatinine urine ratio  Result Value Ref Range   Microalb, Ur <0.7 0.0 - 1.9 mg/dL   Creatinine,U 46.4 mg/dL   Microalb Creat Ratio 1.5 0.0 - 30.0 mg/g  POCT HgB A1C  Result Value Ref Range   Hemoglobin A1C 7.0       Assessment & Plan:   Problem List Items Addressed This Visit    Venous ulcer of leg ([place])    R ankle wound finally healed. Appreciate wound clinic care of patient.       Chronic venous insufficiency    Continue elevation of legs as well as compression stocking use.      Cellulitis of leg, right - Primary    Possible mild infection - treat with keflex 250mg  TID x 1 week. Update if not improving with  treatment.           Follow up plan: Return if symptoms worsen or fail to improve.  Ria Bush, MD

## 2015-07-09 NOTE — Assessment & Plan Note (Signed)
Continue elevation of legs as well as compression stocking use.

## 2015-07-09 NOTE — Telephone Encounter (Signed)
Spoke with patient and she will be here at 1:00 today.

## 2015-07-10 ENCOUNTER — Ambulatory Visit: Payer: Medicare Other | Admitting: Surgery

## 2015-07-17 ENCOUNTER — Ambulatory Visit: Payer: Medicare Other | Admitting: Surgery

## 2015-07-24 ENCOUNTER — Ambulatory Visit: Payer: Medicare Other | Admitting: Surgery

## 2015-07-28 ENCOUNTER — Encounter: Payer: Medicare Other | Attending: Internal Medicine | Admitting: Internal Medicine

## 2015-07-28 DIAGNOSIS — I10 Essential (primary) hypertension: Secondary | ICD-10-CM | POA: Diagnosis not present

## 2015-07-28 DIAGNOSIS — L97811 Non-pressure chronic ulcer of other part of right lower leg limited to breakdown of skin: Secondary | ICD-10-CM | POA: Insufficient documentation

## 2015-07-28 DIAGNOSIS — E11610 Type 2 diabetes mellitus with diabetic neuropathic arthropathy: Secondary | ICD-10-CM | POA: Diagnosis not present

## 2015-07-28 DIAGNOSIS — I87331 Chronic venous hypertension (idiopathic) with ulcer and inflammation of right lower extremity: Secondary | ICD-10-CM | POA: Insufficient documentation

## 2015-07-28 DIAGNOSIS — E11622 Type 2 diabetes mellitus with other skin ulcer: Secondary | ICD-10-CM | POA: Diagnosis not present

## 2015-07-28 DIAGNOSIS — Z951 Presence of aortocoronary bypass graft: Secondary | ICD-10-CM | POA: Diagnosis not present

## 2015-07-28 DIAGNOSIS — I87321 Chronic venous hypertension (idiopathic) with inflammation of right lower extremity: Secondary | ICD-10-CM | POA: Diagnosis not present

## 2015-07-28 DIAGNOSIS — X58XXXA Exposure to other specified factors, initial encounter: Secondary | ICD-10-CM | POA: Diagnosis not present

## 2015-07-28 DIAGNOSIS — S41112A Laceration without foreign body of left upper arm, initial encounter: Secondary | ICD-10-CM | POA: Diagnosis not present

## 2015-07-29 NOTE — Progress Notes (Addendum)
LATREASE, BRUMBY ([id number]) Visit Report for 07/28/2015 Arrival Information Details Patient Name: [name], [name] Date of Service: 07/28/2015 2:45 PM Medical Record Patient Account Number: [id number] Number: Treating RN: Montey Hora [birth year] (82 y.o. Other Clinician: Date of Birth/Sex: Female) Treating ROBSON, MICHAEL Primary Care Physician/Extender: Ferd Glassing Physician: Referring Physician: Donzetta Sprung in Treatment: 32 Visit Information History Since Last Visit Added or deleted any medications: No Patient Arrived: Walker Any new allergies or adverse reactions: No Arrival Time: 15:02 Had a fall or experienced change in No Accompanied By: self activities of daily living that may affect Transfer Assistance: None risk of falls: Patient Identification Verified: Yes Signs or symptoms of abuse/neglect since last No Secondary Verification Process Yes visito Completed: Hospitalized since last visit: No Patient Requires Transmission- No Pain Present Now: No Based Precautions: Patient Has Alerts: Yes Patient Alerts: Patient on Blood Thinner Type II Diabetic 81 MG aspirin Electronic Signature(s) Signed: 07/28/2015 5:03:54 PM By: Montey Hora Entered By: Montey Hora on 07/28/2015 15:05:41 Brockmann, [name] -------------------------------------------------------------------------------- Clinic Level of Care Assessment Details Patient Name: [name], [name] Date of Service: 07/28/2015 2:45 PM Medical Record Patient Account Number: [id number] Number: Treating RN: Montey Hora 1927-12-22 (82 y.o. Other Clinician: Date of Birth/Sex: Female) Treating ROBSON, MICHAEL Primary Care Physician/Extender: Ferd Glassing Physician: Referring Physician: Donzetta Sprung in Treatment: 49 Clinic Level of Care Assessment Items TOOL 4 Quantity Score []  - Use when only an EandM is performed on FOLLOW-UP visit  0 ASSESSMENTS - Nursing Assessment / Reassessment X - Reassessment of Co-morbidities (includes updates in patient status) 1 10 X - Reassessment of Adherence to Treatment Plan 1 5 ASSESSMENTS - Wound and Skin Assessment / Reassessment []  - Simple Wound Assessment / Reassessment - one wound 0 []  - Complex Wound Assessment / Reassessment - multiple wounds 0 []  - Dermatologic / Skin Assessment (not related to wound area) 0 ASSESSMENTS - Focused Assessment []  - Circumferential Edema Measurements - multi extremities 0 []  - Nutritional Assessment / Counseling / Intervention 0 X - Lower Extremity Assessment (monofilament, tuning fork, pulses) 1 5 []  - Peripheral Arterial Disease Assessment (using hand held doppler) 0 ASSESSMENTS - Ostomy and/or Continence Assessment and Care []  - Incontinence Assessment and Management 0 []  - Ostomy Care Assessment and Management (repouching, etc.) 0 PROCESS - Coordination of Care X - Simple Patient / Family Education for ongoing care 1 15 []  - Complex (extensive) Patient / Family Education for ongoing care 0 []  - Staff obtains Consents, Records, Test Results / Process Orders 0 [name], [name] []  - Staff telephones HHA, Nursing Homes / Clarify orders / etc 0 []  - Routine Transfer to another Facility (non-emergent condition) 0 []  - Routine Hospital Admission (non-emergent condition) 0 []  - New Admissions / Biomedical engineer / Ordering NPWT, Apligraf, etc. 0 []  - Emergency Hospital Admission (emergent condition) 0 X - Simple Discharge Coordination 1 10 []  - Complex (extensive) Discharge Coordination 0 PROCESS - Special Needs []  - Pediatric / Minor Patient Management 0 []  - Isolation Patient Management 0 []  - Hearing / Language / Visual special needs 0 []  - Assessment of Community assistance (transportation, D/C planning, etc.) 0 []  - Additional assistance / Altered mentation 0 []  - Support Surface(s) Assessment (bed, cushion, seat, etc.)  0 INTERVENTIONS - Wound Cleansing / Measurement []  - Simple Wound Cleansing - one wound 0 []  - Complex Wound Cleansing - multiple wounds 0 []  - Wound Imaging (photographs - any number  of wounds) 0 []  - Wound Tracing (instead of photographs) 0 []  - Simple Wound Measurement - one wound 0 []  - Complex Wound Measurement - multiple wounds 0 INTERVENTIONS - Wound Dressings []  - Small Wound Dressing one or multiple wounds 0 []  - Medium Wound Dressing one or multiple wounds 0 []  - Large Wound Dressing one or multiple wounds 0 []  - Application of Medications - topical 0 []  - Application of Medications - injection 0 [name], [name] ([id number]) INTERVENTIONS - Miscellaneous []  - External ear exam 0 []  - Specimen Collection (cultures, biopsies, blood, body fluids, etc.) 0 []  - Specimen(s) / Culture(s) sent or taken to Lab for analysis 0 []  - Patient Transfer (multiple staff / Harrel Lemon Lift / Similar devices) 0 []  - Simple Staple / Suture removal (25 or less) 0 []  - Complex Staple / Suture removal (26 or more) 0 []  - Hypo / Hyperglycemic Management (close monitor of Blood Glucose) 0 []  - Ankle / Brachial Index (ABI) - do not check if billed separately 0 X - Vital Signs 1 5 Has the patient been seen at the hospital within the last three years: Yes Total Score: 50 Level Of Care: New/Established - Level 2 Electronic Signature(s) Signed: 07/28/2015 3:53:44 PM By: Montey Hora Entered By: Montey Hora on 07/28/2015 15:53:44 [name], [name] ([id number]) -------------------------------------------------------------------------------- Encounter Discharge Information Details Patient Name: [name], [name] Date of Service: 07/28/2015 2:45 PM Medical Record Patient Account Number: [id number] Number: Treating RN: Montey Hora [birth year] (82 y.o. Other Clinician: Date of Birth/Sex: Female) Treating ROBSON, MICHAEL Primary Care Physician/Extender: Ferd Glassing Physician: Referring Physician: Donzetta Sprung in Treatment: 110 Encounter Discharge Information Items Discharge Pain Level: 0 Discharge Condition: Stable Ambulatory Status: Walker Discharge Destination: Home Private Transportation: Auto Accompanied By: self Schedule Follow-up Appointment: No Medication Reconciliation completed and No provided to Patient/Care Naveya Ellerman: Clinical Summary of Care: Electronic Signature(s) Signed: 07/28/2015 3:55:56 PM By: Montey Hora Entered By: Montey Hora on 07/28/2015 15:55:56 [name], [name] ([id number]) -------------------------------------------------------------------------------- Lower Extremity Assessment Details Patient Name: [name], [name] Date of Service: 07/28/2015 2:45 PM Medical Record Patient Account Number: [id number] Number: Treating RN: Montey Hora 10-15-1927 (82 y.o. Other Clinician: Date of Birth/Sex: Female) Treating ROBSON, MICHAEL Primary Care Physician/Extender: Ferd Glassing Physician: Referring Physician: Donzetta Sprung in Treatment: 49 Edema Assessment Assessed: [Left: No] [Right: No] Edema: [Left: N] [Right: o] Vascular Assessment Pulses: Posterior Tibial Dorsalis Pedis Palpable: [Right:Yes] Extremity colors, hair growth, and conditions: Extremity Color: [Right:Mottled] Hair Growth on Extremity: [Right:No] Temperature of Extremity: [Right:Warm] Capillary Refill: [Right:< 3 seconds] Notes area of black/purple over healed wound site. Cleansed with antibacterial soap and water and it came off Electronic Signature(s) Signed: 07/28/2015 5:03:54 PM By: Montey Hora Entered By: Montey Hora on 07/28/2015 15:25:19 [name], [name] ([id number]) -------------------------------------------------------------------------------- Patient/Caregiver Education Details Patient Name: [name], [name] Date of Service: 07/28/2015 2:45 PM Medical Record Patient  Account Number: [id number] Number: Treating RN: Montey Hora [birth year] (82 y.o. Other Clinician: Date of Birth/Gender: Female) Treating ROBSON, MICHAEL Primary Care Physician/Extender: Ferd Glassing Physician: Weeks in Treatment: 49 Referring Physician: Ria Bush Education Assessment Education Provided To: Patient Education Topics Provided Venous: Handouts: Other: continue wearing compression hose daily Methods: Explain/Verbal Responses: State content correctly Electronic Signature(s) Signed: 07/28/2015 3:56:19 PM By: Montey Hora Entered By: Montey Hora on 07/28/2015 15:56:18 [name], [name] ([id number]) -------------------------------------------------------------------------------- [place] Details Patient Name: [name], [name] Date of Service: 07/28/2015 2:45 PM Medical Record Patient Account Number: [id number]  Number: Treating RN: Montey Hora [birth year] (82 y.o. Other Clinician: Date of Birth/Sex: Female) Treating ROBSON, MICHAEL Primary Care Physician/Extender: Ferd Glassing Physician: Referring Physician: Donzetta Sprung in Treatment: 49 Vital Signs Time Taken: 15:05 Temperature (F): 97.8 Height (in): 58 Pulse (bpm): 75 Weight (lbs): 151 Respiratory Rate (breaths/min): 18 Body Mass Index (BMI): 31.6 Blood Pressure (mmHg): 116/48 Reference Range: 80 - 120 mg / dl Electronic Signature(s) Signed: 07/28/2015 5:03:54 PM By: Montey Hora Entered By: Montey Hora on 07/28/2015 15:06:35

## 2015-07-30 NOTE — Progress Notes (Signed)
HAVIN, HEENEY ([id number]) Visit Report for 07/28/2015 Chief Complaint Document Details Patient Name: [name], [name] Date of Service: 07/28/2015 2:45 PM Medical Record Patient Account Number: [id number] Number: Treating RN: Montey Hora July 10, 1927 (82 y.o. Other Clinician: Date of Birth/Sex: Female) Treating Khaliah Barnick Primary Care Physician/Extender: Ferd Glassing Physician: Referring Physician: Donzetta Sprung in Treatment: 93 Information Obtained from: Patient Chief Complaint Patient presents for treatment of an open ulcer due to venous insufficiency. 82 year old patient who is known to me from the [facility] wound care center now comes in follow up here for a chronic venous ulceration of her right lower extremity which she's had for about 6 months. 07/28/15 the patient repeat presents today out of concern for reopening of wounds predominantly on her right leg Electronic Signature(s) Signed: 07/30/2015 7:59:30 AM By: Linton Ham MD Entered By: Linton Ham on 07/29/2015 05:27:57 [name], [name] ([id number]) -------------------------------------------------------------------------------- HPI Details Patient Name: [name], [name] Date of Service: 07/28/2015 2:45 PM Medical Record Patient Account Number: [id number] Number: Treating RN: Montey Hora [birth year] (82 y.o. Other Clinician: Date of Birth/Sex: Female) Treating Briah Nary Primary Care Physician/Extender: Ferd Glassing Physician: Referring Physician: Donzetta Sprung in Treatment: 57 History of Present Illness Location: ulcer in the right lower extremity for several months Quality: Patient reports experiencing a dull pain to affected area(s). Severity: Patient states wound (s) are getting better. Duration: Patient has had the wound for > 3 months prior to seeking treatment at the wound center Timing: Pain in wound is Intermittent (comes and  goes Context: The wound appeared gradually over time Modifying Factors: Consults to this date include: vascular surgery for the veins in December 2015 and she's been seeing the wound care center at [facility] for several months. Associated Signs and Symptoms: Patient reports having difficulty standing for long periods. HPI Description: This is an 82 yrs old female we have been following since the beginning of January for a wound on the right medial ankle. She had previously had venous ablation of the greater saphenous vein by Dr. Kellie Simmering. She came to [place] with erythema of the right leg and subsequently is opened in the medial aspect of the right ankle. She has not been very compliant with compression. We ordered her at juxta lite stocking, however she apparently has not been able to wear it. The area on the medial aspect of her left ankle has much less induration and erythema. The edema is better controlled. No evidence of infection is noted. 07/18/14; no major change in the condition of this wound. There is still surface slough and probably some drainage. 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 All other veins showed no evidence of DVT, superficial venous thrombosis or incompetence. however she does have manifestations of chronic venous hypertension and I believe we will change her dressing over to Prisma and a Profore light dressing. she sees her primary care doctor regularly and her last hemoglobin A1c has been around 7%.her other comorbidities are hypertension, CABG in 1998, peptic ulcer disease, history of echo with a ejection fraction of 55-60% and grade 1 diastolic dysfunction. past surgical history include coronary artery bypass graft in 1998, cholecystectomy, appendectomy, hand surgery, endovenous ablation of the saphenous vein with laser by Dr. Kellie Simmering in December  2015. 08/29/2014 - she complains of increased calf pain over the past week. removed her compression  bandage and pain improved. no fever or chills. Mild clear drainage. 09/04/2014 -- she has not been compliant and has not done a dressing change as we had ordered last week. She always complains and does not keep her wrap on and this past week she has done the same. She has completed a course of doxycycline. [name], [name] ([id number]) 09/25/2014 -- she has tolerated her dressing all week and has not removed it. She says it's hurting a lot today and would not like any debridement. We will do so as per her wishes. she did get a appointment with the vascular surgeon and sees him on 6/28. 10/03/2014 - She was seen by Dr. Tinnie Gens 09/30/2014. Assessment: I reviewed the previous venous duplex exam study performed in September 2015 which reveals no options for treating this venous ulcer. There is no significant superficial venous reflux to treat with laser ablation.Most recent ABI was 0.91 in the right foot with palpable dorsalis pedis pulse at 3+ Plan: patient needs to return to the [place] wound center and be treated by Dr. Con Memos. No arterial or venous options available from vascular standpoint or treatment of right leg stasis ulcer.Continued dressing changes and compression is the treatment of choice 10/16/2014 -- today will be her first application of Apligraf. 10/23/2014 -- we are going to be able to apply her second day of Apligraf next week. She is complaining of some tenderness in the region of Achilles tendon and this is possibly due to her wrap. 11/06/2014 -- she did not have Apligraf last week because the wound was not looking good but she says she is feeling much better now. 11/13/2014 -- she has been doing well this week and she is here for her second Apligraf. 11/27/2014 -- she is here for her third application of Apligraf 12/12/2014 -- she is here for her fourth application of  Apligraf. Also during these past 3 days she had an injury to her left forearm which she caught against the edge of her desk and has a lacerated wound which has gone down to the subcutaneous tissue. 12/26/2014 -- she is here for a last/fifth application of Apligraf. 04/10/2015 -- the patient is here for her first application of Puraply. 04/17/2015 -- the patient is here for her second application of Puraply. 04/24/2015 -- her Puraply was not ordered for today and hence he would use a collagen dressing locally. 05/01/2015 -- the patient is here for her third application of Puraply. 05/08/2015 -- the patient has been doing fine and overall has shown much improvement and is here for a routine visit 05/15/2015 -- she was admitted to the hospital with viral gastroenteritis and acute renal failure with dehydration and has been on supportive care since February 7. She was discharged today and has come for her wound care. Details of her hospital admission and therapy reviewed. 07/28/15; this is a patient I know from previous stays at the [facility] wound care center. I see she was most recently here predominantly for a venous insufficiency ulcer on the right lower medial leg. Apparently she phones our nurses on a regular basis and there was concern generated that she actually had a lower extremity wound. She arrives with a difficult to follow litany of complaints against the various compression garments we have supplied including I believe juxtalite stockings and 2 layer compression. She seems to have settled on wearing just one layer of the 2 layer compression. I'm really not certain at this point what her problem was with  the juxtalite stockings Electronic Signature(s) Signed: 07/30/2015 7:59:30 AM By: Linton Ham MD Entered By: Linton Ham on 07/29/2015 05:34:40 [name], [name] ([id number]) -------------------------------------------------------------------------------- Physical Exam  Details Patient Name: [name], [name] Date of Service: 07/28/2015 2:45 PM Medical Record Patient Account Number: [id number] Number: Treating RN: Montey Hora 08-12-1927 (82 y.o. Other Clinician: Date of Birth/Sex: Female) Treating Daymion Nazaire Primary Care Physician/Extender: Ferd Glassing Physician: Referring Physician: Donzetta Sprung in Treatment: 69 Constitutional Sitting or standing Blood Pressure is within target range for patient.. Pulse regular and within target range for patient.Marland Kitchen Respirations regular, non-labored and within target range.. Temperature is normal and within the target range for the patient.. Patient's appearance is neat and clean. Appears in no acute distress. Well nourished and well developed.. Eyes Conjunctivae clear. No discharge. No scleral icterus. Respiratory Respiratory effort is easy and symmetric bilaterally. Rate is normal at rest and on room air.. Decreased air entry but no wheezing. Cardiovascular Heart rhythm and rate regular, without murmur or gallop. Appears to be euvolemic. Pedal pulses are palpable bilaterally. Edema present in both extremities however this is mild. She has tortuous varicosities. I believe also some venous inflammation in the right leg. Integumentary (Hair, Skin) Skin and subcutaneous tissue without rashes, lesions, discoloration or ulcers. No evidence of fungal disease. Hair and skin color texture normal and healthy in appearance.. Over what was her wound on the right medial leg there was a green superficial discoloration which may have been tinea. This was removed with vigorous scrubbing with wound cleanser. Psychiatric No evidence of depression, anxiety, or agitation. Calm, cooperative, and communicative. Appropriate interactions and affect.. Notes Wound exam; her legs were carefully inspected there are no open areas here. The only concern I had was with some erythema on the right anterior  leg well removed from her last wound site. There is no tenderness here and no warmth. I was left with the impression that this was probably venous inflammation Electronic Signature(s) Signed: 07/30/2015 7:59:30 AM By: Linton Ham MD Entered By: Linton Ham on 07/29/2015 05:37:55 [name], [name] ([id number]) -------------------------------------------------------------------------------- Physician Orders Details Patient Name: Frosch, Kateland C. Date of Service: 07/28/2015 2:45 PM Medical Record Patient Account Number: [id number] Number: Treating RN: Montey Hora 1927/06/08 (82 y.o. Other Clinician: Date of Birth/Sex: Female) Treating Jayvien Rowlette Primary Care Physician/Extender: Ferd Glassing Physician: Referring Physician: Donzetta Sprung in Treatment: 58 Verbal / Phone Orders: Yes Clinician: Montey Hora Read Back and Verified: Yes Diagnosis Coding Discharge From [facility] Services o Discharge from [place] Signature(s) Signed: 07/28/2015 5:03:54 PM By: Montey Hora Signed: 07/30/2015 7:59:30 AM By: Linton Ham MD Entered By: Montey Hora on 07/28/2015 15:31:57 Qazi, [name] ([id number]) -------------------------------------------------------------------------------- Problem List Details Patient Name: Kabir, Nylee C. Date of Service: 07/28/2015 2:45 PM Medical Record Patient Account Number: [id number] Number: Treating RN: Montey Hora 10/27/1927 (82 y.o. Other Clinician: Date of Birth/Sex: Female) Treating Takiya Belmares Primary Care Physician/Extender: Ferd Glassing Physician: Referring Physician: Donzetta Sprung in Treatment: 47 Active Problems ICD-10 Encounter Code Description Active Date Diagnosis E11.622 Type 2 diabetes mellitus with other skin ulcer 08/15/2014 Yes E11.610 Type 2 diabetes mellitus with diabetic neuropathic 08/15/2014 Yes arthropathy I87.331 Chronic  venous hypertension (idiopathic) with ulcer and 08/15/2014 Yes inflammation of right lower extremity Inactive Problems Resolved Problems ICD-10 Code Description Active Date Resolved Date L03.115 Cellulitis of right lower limb 08/29/2014 08/29/2014 S41.112A Laceration without foreign body of left upper arm, initial 12/12/2014 12/12/2014 encounter Electronic Signature(s) Signed:  07/30/2015 7:59:30 AM By: Linton Ham MD Entered By: Linton Ham on 07/29/2015 05:27:18 [name], [name] ([id number]) -------------------------------------------------------------------------------- Progress Note Details Patient Name: [name], [name] Date of Service: 07/28/2015 2:45 PM Medical Record Patient Account Number: [id number] Number: Treating RN: Montey Hora Dec 11, 1927 (82 y.o. Other Clinician: Date of Birth/Sex: Female) Treating Lanaysia Fritchman Primary Care Physician/Extender: Ferd Glassing Physician: Referring Physician: Donzetta Sprung in Treatment: 60 Subjective Chief Complaint Information obtained from Patient Patient presents for treatment of an open ulcer due to venous insufficiency. 82 year old patient who is known to me from the [facility] wound care center now comes in follow up here for a chronic venous ulceration of her right lower extremity which she's had for about 6 months. 07/28/15 the patient repeat presents today out of concern for reopening of wounds predominantly on her right leg History of Present Illness (HPI) The following HPI elements were documented for the patient's wound: Location: ulcer in the right lower extremity for several months Quality: Patient reports experiencing a dull pain to affected area(s). Severity: Patient states wound (s) are getting better. Duration: Patient has had the wound for > 3 months prior to seeking treatment at the wound center Timing: Pain in wound is Intermittent (comes and goes Context: The wound appeared  gradually over time Modifying Factors: Consults to this date include: vascular surgery for the veins in December 2015 and she's been seeing the wound care center at [facility] for several months. Associated Signs and Symptoms: Patient reports having difficulty standing for long periods. This is an 82 yrs old female we have been following since the beginning of January for a wound on the right medial ankle. She had previously had venous ablation of the greater saphenous vein by Dr. Kellie Simmering. She came to [place] with erythema of the right leg and subsequently is opened in the medial aspect of the right ankle. She has not been very compliant with compression. We ordered her at juxta lite stocking, however she apparently has not been able to wear it. The area on the medial aspect of her left ankle has much less induration and erythema. The edema is better controlled. No evidence of infection is noted. 07/18/14; no major change in the condition of this wound. There is still surface slough and probably some drainage. 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 All other veins showed no evidence of DVT, superficial venous thrombosis or incompetence. however she does have manifestations of chronic venous hypertension and I believe we will change her dressing over to Prisma and a Profore light dressing. she sees her primary care doctor regularly and her last hemoglobin A1c has been around 7%.her other comorbidities are hypertension, CABG in 1998, peptic ulcer disease, history of echo with a ejection fraction [name], [name] ([id number]) of 55-60% and grade 1 diastolic dysfunction. past surgical history include coronary artery bypass graft in 1998, cholecystectomy, appendectomy, hand surgery, endovenous ablation of the saphenous vein with laser by Dr. Kellie Simmering in December 2015. 08/29/2014 - she  complains of increased calf pain over the past week. removed her compression bandage and pain improved. no fever or chills. Mild clear drainage. 09/04/2014 -- she has not been compliant and has not done a dressing change as we had ordered last week. She always complains and does not keep her wrap on and this past week she has done the same.  She has completed a course of doxycycline. 09/25/2014 -- she has tolerated her dressing all week and has not removed it. She says it's hurting a lot today and would not like any debridement. We will do so as per her wishes. she did get a appointment with the vascular surgeon and sees him on 6/28. 10/03/2014 - She was seen by Dr. Tinnie Gens 09/30/2014. Assessment: I reviewed the previous venous duplex exam study performed in September 2015 which reveals no options for treating this venous ulcer. There is no significant superficial venous reflux to treat with laser ablation.Most recent ABI was 0.91 in the right foot with palpable dorsalis pedis pulse at 3+ Plan: patient needs to return to the [place] wound center and be treated by Dr. Con Memos. No arterial or venous options available from vascular standpoint or treatment of right leg stasis ulcer.Continued dressing changes and compression is the treatment of choice 10/16/2014 -- today will be her first application of Apligraf. 10/23/2014 -- we are going to be able to apply her second day of Apligraf next week. She is complaining of some tenderness in the region of Achilles tendon and this is possibly due to her wrap. 11/06/2014 -- she did not have Apligraf last week because the wound was not looking good but she says she is feeling much better now. 11/13/2014 -- she has been doing well this week and she is here for her second Apligraf. 11/27/2014 -- she is here for her third application of Apligraf 12/12/2014 -- she is here for her fourth application of Apligraf. Also during these past 3 days she had an  injury to her left forearm which she caught against the edge of her desk and has a lacerated wound which has gone down to the subcutaneous tissue. 12/26/2014 -- she is here for a last/fifth application of Apligraf. 04/10/2015 -- the patient is here for her first application of Puraply. 04/17/2015 -- the patient is here for her second application of Puraply. 04/24/2015 -- her Puraply was not ordered for today and hence he would use a collagen dressing locally. 05/01/2015 -- the patient is here for her third application of Puraply. 05/08/2015 -- the patient has been doing fine and overall has shown much improvement and is here for a routine visit 05/15/2015 -- she was admitted to the hospital with viral gastroenteritis and acute renal failure with dehydration and has been on supportive care since February 7. She was discharged today and has come for her wound care. Details of her hospital admission and therapy reviewed. 07/28/15; this is a patient I know from previous stays at the [facility] wound care center. I see she was most recently here predominantly for a venous insufficiency ulcer on the right lower medial leg. Apparently she phones our nurses on a regular basis and there was concern generated that she actually had a lower extremity wound. She arrives with a difficult to follow litany of complaints against the various compression garments we have supplied including I believe juxtalite stockings and 2 layer compression. She seems to have settled on wearing just one layer of the 2 layer compression. I'm really not certain at this point what [name], [name] her problem was with the juxtalite stockings Objective Constitutional Sitting or standing Blood Pressure is within target range for patient.. Pulse regular and within target range for patient.Marland Kitchen Respirations regular, non-labored and within target range.. Temperature is normal and within the target range for the patient..  Patient's appearance is neat and clean. Appears  in no acute distress. Well nourished and well developed.. Vitals Time Taken: 3:05 PM, Height: 58 in, Weight: 151 lbs, BMI: 31.6, Temperature: 97.8 F, Pulse: 75 bpm, Respiratory Rate: 18 breaths/min, Blood Pressure: 116/48 mmHg. Eyes Conjunctivae clear. No discharge. No scleral icterus. Respiratory Respiratory effort is easy and symmetric bilaterally. Rate is normal at rest and on room air.. Decreased air entry but no wheezing. Cardiovascular Heart rhythm and rate regular, without murmur or gallop. Appears to be euvolemic. Pedal pulses are palpable bilaterally. Edema present in both extremities however this is mild. She has tortuous varicosities. I believe also some venous inflammation in the right leg. Psychiatric No evidence of depression, anxiety, or agitation. Calm, cooperative, and communicative. Appropriate interactions and affect.. General Notes: Wound exam; her legs were carefully inspected there are no open areas here. The only concern I had was with some erythema on the right anterior leg well removed from her last wound site. There is no tenderness here and no warmth. I was left with the impression that this was probably venous inflammation Integumentary (Hair, Skin) Skin and subcutaneous tissue without rashes, lesions, discoloration or ulcers. No evidence of fungal disease. Hair and skin color texture normal and healthy in appearance.. Over what was her wound on the right medial leg there was a green superficial discoloration which may have been tinea. This was removed with vigorous scrubbing with wound cleanser. [name], [name] ([id number]) Assessment Active Problems ICD-10 E11.622 - Type 2 diabetes mellitus with other skin ulcer E11.610 - Type 2 diabetes mellitus with diabetic neuropathic arthropathy I87.331 - Chronic venous hypertension (idiopathic) with ulcer and inflammation of right lower extremity Plan Discharge  From [facility] Services: Discharge from [place] #1 there are no open areas on either leg #2 she has a history of type 2 diabetes with significant venous insufficiency, PAD however there are no open areas and nothing look particularly threatening #3 after a difficult circumferential conversation we settled on 1 layer of her to layer lower extremity stockings. This should provide about 15 mm compression. By itself not necessarily enough however I think in terms of compliance with any form of compression this is the best that we can do. I am not completely certain why she couldn't tolerate the juxtalite stockings. 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 No doubt she will keep in touch with our nursing staff that she is been doing up until now Electronic Signature(s) Signed: 07/30/2015 7:59:30 AM By: Linton Ham MD Entered By: Linton Ham on 07/29/2015 05:41:13 [name], [name] ([id number]) -------------------------------------------------------------------------------- [place] Details Patient Name: [name], Joslynn C. Date of Service: 07/28/2015 Medical Record Patient Account Number: [id number] Number: Treating RN: Montey Hora 1927/10/09 (82 y.o. Other Clinician: Date of Birth/Sex: Female) Treating Leilanee Righetti Primary Care Physician/Extender: Ferd Glassing Physician: Weeks in Treatment: 49 Referring Physician: Ria Bush Diagnosis Coding ICD-10 Codes Code Description E11.622 Type 2 diabetes mellitus with other skin ulcer E11.610 Type 2 diabetes mellitus with diabetic neuropathic arthropathy Chronic venous hypertension (idiopathic) with ulcer and inflammation of right lower I87.331 extremity Facility Procedures CPT4 Code: [id number] Description: [id number] - WOUND CARE VISIT-LEV 2 EST PT Modifier: Quantity: 1 Physician Procedures CPT4:  Description Modifier Quantity Code [id number] - WC PHYS LEVEL 3 - EST PT 1 ICD-10 Description Diagnosis I87.331 Chronic venous hypertension (idiopathic) with ulcer and inflammation of right lower extremity Electronic  Signature(s) Signed: 07/30/2015 7:59:30 AM By: Linton Ham MD Entered By: Linton Ham on 07/29/2015 05:42:00

## 2015-07-31 ENCOUNTER — Ambulatory Visit: Payer: Medicare Other | Admitting: Surgery

## 2015-08-10 ENCOUNTER — Other Ambulatory Visit: Payer: Self-pay

## 2015-08-10 MED ORDER — HYDROCODONE-ACETAMINOPHEN 5-325 MG PO TABS: 0.5000 | ORAL_TABLET | Freq: Four times a day (QID) | ORAL | Status: DC | PRN

## 2015-08-10 NOTE — Telephone Encounter (Signed)
Printed and in Kim's box 

## 2015-08-10 NOTE — Telephone Encounter (Signed)
Pt left v/m requesting rx hydrocodone apap. Call when ready for pick up. Pt last seen and rx last printed # [phone] on 07/09/15.Please advise.

## 2015-08-11 NOTE — Telephone Encounter (Signed)
Message left notifying patient and Rx placed up front for pick up. 

## 2015-08-12 ENCOUNTER — Ambulatory Visit: Payer: Medicare Other | Admitting: Internal Medicine

## 2015-08-14 ENCOUNTER — Ambulatory Visit: Payer: Medicare Other | Admitting: Surgery

## 2015-08-17 ENCOUNTER — Encounter: Payer: Medicare Other | Attending: Internal Medicine | Admitting: Surgery

## 2015-08-17 DIAGNOSIS — E11622 Type 2 diabetes mellitus with other skin ulcer: Secondary | ICD-10-CM | POA: Diagnosis not present

## 2015-08-17 DIAGNOSIS — L97811 Non-pressure chronic ulcer of other part of right lower leg limited to breakdown of skin: Secondary | ICD-10-CM | POA: Insufficient documentation

## 2015-08-17 DIAGNOSIS — E11610 Type 2 diabetes mellitus with diabetic neuropathic arthropathy: Secondary | ICD-10-CM | POA: Insufficient documentation

## 2015-08-17 DIAGNOSIS — Z951 Presence of aortocoronary bypass graft: Secondary | ICD-10-CM | POA: Diagnosis not present

## 2015-08-17 DIAGNOSIS — I87331 Chronic venous hypertension (idiopathic) with ulcer and inflammation of right lower extremity: Secondary | ICD-10-CM | POA: Diagnosis not present

## 2015-08-17 DIAGNOSIS — E119 Type 2 diabetes mellitus without complications: Secondary | ICD-10-CM | POA: Diagnosis not present

## 2015-08-17 DIAGNOSIS — I10 Essential (primary) hypertension: Secondary | ICD-10-CM | POA: Insufficient documentation

## 2015-08-17 DIAGNOSIS — L989 Disorder of the skin and subcutaneous tissue, unspecified: Secondary | ICD-10-CM | POA: Diagnosis not present

## 2015-08-19 NOTE — Progress Notes (Signed)
JOMARIE, EDMONDS ([id number]) Visit Report for 08/17/2015 Chief Complaint Document Details Patient Name: Appenzeller, Yaiza C. Date of Service: 08/17/2015 3:30 PM Medical Record Patient Account Number: [id number] Number: Treating RN: Cornell Barman [birth year] (82 y.o. Other Clinician: Date of Birth/Sex: Female) Treating Lianette Broussard Primary Care Physician/Extender: Ferd Glassing Physician: Referring Physician: Donzetta Sprung in Treatment: 18 Information Obtained from: Patient Chief Complaint Patient presents for treatment of an open ulcer due to venous insufficiency. 82 year old patient who is known to me from the [facility] wound care center now comes in follow up here for a chronic venous ulceration of her right lower extremity which she's had for about 6 months. 07/28/15 the patient repeat presents today out of concern for reopening of wounds predominantly on her right leg Electronic Signature(s) Signed: 08/17/2015 3:54:59 PM By: Christin Fudge MD, FACS Signed: 08/19/2015 7:56:10 AM By: Linton Ham MD Entered By: Christin Fudge on 08/17/2015 15:54:59 Escoto, [name] ([id number]) -------------------------------------------------------------------------------- HPI Details Patient Name: Lopresti, Priscella C. Date of Service: 08/17/2015 3:30 PM Medical Record Patient Account Number: [id number] Number: Treating RN: Cornell Barman 1928/01/16 (82 y.o. Other Clinician: Date of Birth/Sex: Female) Treating Nolan Tuazon Primary Care Physician/Extender: Ferd Glassing Physician: Referring Physician: Donzetta Sprung in Treatment: 52 History of Present Illness Location: ulcer in the right lower extremity for several months Quality: Patient reports experiencing a dull pain to affected area(s). Severity: Patient states wound (s) are getting better. Duration: Patient has had the wound for > 3 months prior to seeking treatment at the wound  center Timing: Pain in wound is Intermittent (comes and goes Context: The wound appeared gradually over time Modifying Factors: Consults to this date include: vascular surgery for the veins in December 2015 and she's been seeing the wound care center at [facility] for several months. Associated Signs and Symptoms: Patient reports having difficulty standing for long periods. HPI Description: This is an 82 yrs old female we have been following since the beginning of January for a wound on the right medial ankle. She had previously had venous ablation of the greater saphenous vein by Dr. Kellie Simmering. She came to [place] with erythema of the right leg and subsequently is opened in the medial aspect of the right ankle. She has not been very compliant with compression. We ordered her at juxta lite stocking, however she apparently has not been able to wear it. The area on the medial aspect of her left ankle has much less induration and erythema. The edema is better controlled. No evidence of infection is noted. 07/18/14; no major change in the condition of this wound. There is still surface slough and probably some drainage. 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 All other veins showed no evidence of DVT, superficial venous thrombosis or incompetence. however she does have manifestations of chronic venous hypertension and I believe we will change her dressing over to Prisma and a Profore light dressing. she sees her primary care doctor regularly and her last hemoglobin A1c has been around 7%.her other comorbidities are hypertension, CABG in 1998, peptic ulcer disease, history of echo with a ejection fraction of 55-60% and grade 1 diastolic dysfunction. past surgical history include coronary artery bypass graft in 1998, cholecystectomy, appendectomy, hand surgery, endovenous ablation of the  saphenous vein with laser by Dr. Kellie Simmering in December 2015. 08/29/2014 - she complains of increased  calf pain over the past week. removed her compression bandage and pain improved. no fever or chills. Mild clear drainage. 09/04/2014 -- she has not been compliant and has not done a dressing change as we had ordered last week. She always complains and does not keep her wrap on and this past week she has done the same. She has completed a course of doxycycline. ROSIE, REIF ([id number]) 09/25/2014 -- she has tolerated her dressing all week and has not removed it. She says it's hurting a lot today and would not like any debridement. We will do so as per her wishes. she did get a appointment with the vascular surgeon and sees him on 6/28. 10/03/2014 - She was seen by Dr. Tinnie Gens 09/30/2014. Assessment: I reviewed the previous venous duplex exam study performed in September 2015 which reveals no options for treating this venous ulcer. There is no significant superficial venous reflux to treat with laser ablation.Most recent ABI was 0.91 in the right foot with palpable dorsalis pedis pulse at 3+ Plan: patient needs to return to the [facility] wound center and be treated by Dr. Con Memos. No arterial or venous options available from vascular standpoint or treatment of right leg stasis ulcer.Continued dressing changes and compression is the treatment of choice 10/16/2014 -- today will be her first application of Apligraf. 10/23/2014 -- we are going to be able to apply her second day of Apligraf next week. She is complaining of some tenderness in the region of Achilles tendon and this is possibly due to her wrap. 11/06/2014 -- she did not have Apligraf last week because the wound was not looking good but she says she is feeling much better now. 11/13/2014 -- she has been doing well this week and she is here for her second Apligraf. 11/27/2014 -- she is here for her third application of  Apligraf 12/12/2014 -- she is here for her fourth application of Apligraf. Also during these past 3 days she had an injury to her left forearm which she caught against the edge of her desk and has a lacerated wound which has gone down to the subcutaneous tissue. 12/26/2014 -- she is here for a last/fifth application of Apligraf. 04/10/2015 -- the patient is here for her first application of Puraply. 04/17/2015 -- the patient is here for her second application of Puraply. 04/24/2015 -- her Puraply was not ordered for today and hence he would use a collagen dressing locally. 05/01/2015 -- the patient is here for her third application of Puraply. 05/08/2015 -- the patient has been doing fine and overall has shown much improvement and is here for a routine visit 05/15/2015 -- she was admitted to the hospital with viral gastroenteritis and acute renal failure with dehydration and has been on supportive care since February 7. She was discharged today and has come for her wound care. Details of her hospital admission and therapy reviewed. 07/28/15; this is a patient I know from previous stays at the [facility] wound care center. I see she was most recently here predominantly for a venous insufficiency ulcer on the right lower medial leg. Apparently she phones our nurses on a regular basis and there was concern generated that she actually had a lower extremity wound. She arrives with a difficult to follow litany of complaints against the various compression garments we have supplied including I believe juxtalite stockings and 2 layer compression. She seems to have settled on wearing just one layer of the 2 layer compression. I'm really not  certain at this point what her problem was with the juxtalite stockings 08/17/2015 -- she has been using some silver product on her lower extremity on the right and was concerned about that blackish discoloration. He has no open wounds. She has been wearing  some compression stockings which may be the 15-20 mm type. Electronic Signature(s) Signed: 08/17/2015 3:55:57 PM By: Christin Fudge MD, FACS Signed: 08/19/2015 7:56:10 AM By: Linton Ham MD Entered By: Christin Fudge on 08/17/2015 15:55:56 Hoaglin, [name] ([id number]) Burdi, [name] ([id number]) -------------------------------------------------------------------------------- Physical Exam Details Patient Name: Pal, Quita C. Date of Service: 08/17/2015 3:30 PM Medical Record Patient Account Number: [id number] Number: Treating RN: Cornell Barman 02/22/1928 (82 y.o. Other Clinician: Date of Birth/Sex: Female) Treating Delaney Schnick Primary Care Physician/Extender: Ferd Glassing Physician: Referring Physician: Donzetta Sprung in Treatment: 52 Constitutional . Pulse regular. Respirations normal and unlabored. Afebrile. . Eyes Nonicteric. Reactive to light. Ears, Nose, Mouth, and Throat Lips, teeth, and gums WNL.Marland Kitchen Moist mucosa without lesions. Neck supple and nontender. No palpable supraclavicular or cervical adenopathy. Normal sized without goiter. Respiratory WNL. No retractions.. Cardiovascular Pedal Pulses WNL. No clubbing, cyanosis or edema. Lymphatic No adneopathy. No adenopathy. No adenopathy. Musculoskeletal Adexa without tenderness or enlargement.. Digits and nails w/o clubbing, cyanosis, infection, petechiae, ischemia, or inflammatory conditions.. Integumentary (Hair, Skin) No suspicious lesions. No crepitus or fluctuance. No peri-wound warmth or erythema. No masses.Marland Kitchen Psychiatric Judgement and insight Intact.. No evidence of depression, anxiety, or agitation.. Notes she has stigmata of previous varicose veins but no open ulceration and the skin is intact and there is no evidence of cellulitis. Electronic Signature(s) Signed: 08/17/2015 3:56:27 PM By: Christin Fudge MD, FACS Signed: 08/19/2015 7:56:10 AM By: Linton Ham MD Entered By:  Christin Fudge on 08/17/2015 15:56:26 Ringgold, [name] ([id number]) -------------------------------------------------------------------------------- Physician Orders Details Patient Name: Decaire, Anabelle C. Date of Service: 08/17/2015 3:30 PM Medical Record Number: [id number] Patient Account Number: [id number] Date of Birth/Sex: 1927/05/21 (82 y.o. Female) Treating RN: Cornell Barman Primary Care Physician: Ria Bush Other Clinician: Referring Physician: Ria Bush Treating Physician/Extender: Frann Rider in Treatment: 70 Verbal / Phone Orders: Yes Clinician: Cornell Barman Read Back and Verified: No Diagnosis Coding ICD-10 Coding Code Description E11.622 Type 2 diabetes mellitus with other skin ulcer E11.610 Type 2 diabetes mellitus with diabetic neuropathic arthropathy Chronic venous hypertension (idiopathic) with ulcer and inflammation of right lower I87.331 extremity Discharge From [facility] o Discharge from [place] Signature(s) Signed: 08/18/2015 11:45:12 AM By: Gretta Cool RN, BSN, Kim RN, BSN Signed: 08/18/2015 4:27:33 PM By: Christin Fudge MD, FACS Entered By: Gretta Cool, RN, BSN, Kim on 08/17/2015 16:09:09 Keagy, [name] ([id number]) -------------------------------------------------------------------------------- Problem List Details Patient Name: Sarver, Rikayla C. Date of Service: 08/17/2015 3:30 PM Medical Record Patient Account Number: [id number] Number: Treating RN: Cornell Barman Jul 06, 1927 (82 y.o. Other Clinician: Date of Birth/Sex: Female) Treating Sebert Stollings Primary Care Physician/Extender: Ferd Glassing Physician: Referring Physician: Donzetta Sprung in Treatment: 46 Active Problems ICD-10 Encounter Code Description Active Date Diagnosis E11.622 Type 2 diabetes mellitus with other skin ulcer 08/15/2014 Yes E11.610 Type 2 diabetes mellitus with diabetic neuropathic 08/15/2014  Yes arthropathy I87.331 Chronic venous hypertension (idiopathic) with ulcer and 08/15/2014 Yes inflammation of right lower extremity Inactive Problems Resolved Problems ICD-10 Code Description Active Date Resolved Date L03.115 Cellulitis of right lower limb 08/29/2014 08/29/2014 S41.112A Laceration without foreign body of left upper arm, initial 12/12/2014 12/12/2014 encounter Electronic Signature(s) Signed: 08/17/2015 3:54:50 PM By: Christin Fudge MD, FACS  Signed: 08/19/2015 7:56:10 AM By: Linton Ham MD Entered By: Christin Fudge on 08/17/2015 15:54:50 Limones, [name] ([id number]) -------------------------------------------------------------------------------- Progress Note Details Patient Name: Hard, Shatara C. Date of Service: 08/17/2015 3:30 PM Medical Record Patient Account Number: [id number] Number: Treating RN: Cornell Barman 09/15/1927 (82 y.o. Other Clinician: Date of Birth/Sex: Female) Treating Tonica Brasington Primary Care Physician/Extender: Ferd Glassing Physician: Referring Physician: Donzetta Sprung in Treatment: 5 Subjective Chief Complaint Information obtained from Patient Patient presents for treatment of an open ulcer due to venous insufficiency. 82 year old patient who is known to me from the [facility] wound care center now comes in follow up here for a chronic venous ulceration of her right lower extremity which she's had for about 6 months. 07/28/15 the patient repeat presents today out of concern for reopening of wounds predominantly on her right leg History of Present Illness (HPI) The following HPI elements were documented for the patient's wound: Location: ulcer in the right lower extremity for several months Quality: Patient reports experiencing a dull pain to affected area(s). Severity: Patient states wound (s) are getting better. Duration: Patient has had the wound for > 3 months prior to seeking treatment at the wound  center Timing: Pain in wound is Intermittent (comes and goes Context: The wound appeared gradually over time Modifying Factors: Consults to this date include: vascular surgery for the veins in December 2015 and she's been seeing the wound care center at [facility] for several months. Associated Signs and Symptoms: Patient reports having difficulty standing for long periods. This is an 82 yrs old female we have been following since the beginning of January for a wound on the right medial ankle. She had previously had venous ablation of the greater saphenous vein by Dr. Kellie Simmering. She came to [place] with erythema of the right leg and subsequently is opened in the medial aspect of the right ankle. She has not been very compliant with compression. We ordered her at juxta lite stocking, however she apparently has not been able to wear it. The area on the medial aspect of her left ankle has much less induration and erythema. The edema is better controlled. No evidence of infection is noted. 07/18/14; no major change in the condition of this wound. There is still surface slough and probably some drainage. 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 All other veins showed no evidence of DVT, superficial venous thrombosis or incompetence. however she does have manifestations of chronic venous hypertension and I believe we will change her dressing over to Prisma and a Profore light dressing. she sees her primary care doctor regularly and her last hemoglobin A1c has been around 7%.her other comorbidities are hypertension, CABG in 1998, peptic ulcer disease, history of echo with a ejection fraction Burnstein, Deirdra C. ([id number]) of 55-60% and grade 1 diastolic dysfunction. past surgical history include coronary artery bypass graft in 1998, cholecystectomy, appendectomy, hand surgery, endovenous  ablation of the saphenous vein with laser by Dr. Kellie Simmering in December 2015. 08/29/2014 - she complains of increased calf pain over the past week. removed her compression bandage and pain improved. no fever or chills. Mild clear drainage. 09/04/2014 -- she has not been compliant and has not done a dressing change as we had ordered last week. She always complains and does not keep her wrap on and this past week she has done the  same. She has completed a course of doxycycline. 09/25/2014 -- she has tolerated her dressing all week and has not removed it. She says it's hurting a lot today and would not like any debridement. We will do so as per her wishes. she did get a appointment with the vascular surgeon and sees him on 6/28. 10/03/2014 - She was seen by Dr. Tinnie Gens 09/30/2014. Assessment: I reviewed the previous venous duplex exam study performed in September 2015 which reveals no options for treating this venous ulcer. There is no significant superficial venous reflux to treat with laser ablation.Most recent ABI was 0.91 in the right foot with palpable dorsalis pedis pulse at 3+ Plan: patient needs to return to the [facility] wound center and be treated by Dr. Con Memos. No arterial or venous options available from vascular standpoint or treatment of right leg stasis ulcer.Continued dressing changes and compression is the treatment of choice 10/16/2014 -- today will be her first application of Apligraf. 10/23/2014 -- we are going to be able to apply her second day of Apligraf next week. She is complaining of some tenderness in the region of Achilles tendon and this is possibly due to her wrap. 11/06/2014 -- she did not have Apligraf last week because the wound was not looking good but she says she is feeling much better now. 11/13/2014 -- she has been doing well this week and she is here for her second Apligraf. 11/27/2014 -- she is here for her third application of Apligraf 12/12/2014 -- she is  here for her fourth application of Apligraf. Also during these past 3 days she had an injury to her left forearm which she caught against the edge of her desk and has a lacerated wound which has gone down to the subcutaneous tissue. 12/26/2014 -- she is here for a last/fifth application of Apligraf. 04/10/2015 -- the patient is here for her first application of Puraply. 04/17/2015 -- the patient is here for her second application of Puraply. 04/24/2015 -- her Puraply was not ordered for today and hence he would use a collagen dressing locally. 05/01/2015 -- the patient is here for her third application of Puraply. 05/08/2015 -- the patient has been doing fine and overall has shown much improvement and is here for a routine visit 05/15/2015 -- she was admitted to the hospital with viral gastroenteritis and acute renal failure with dehydration and has been on supportive care since February 7. She was discharged today and has come for her wound care. Details of her hospital admission and therapy reviewed. 07/28/15; this is a patient I know from previous stays at the [facility] wound care center. I see she was most recently here predominantly for a venous insufficiency ulcer on the right lower medial leg. Apparently she phones our nurses on a regular basis and there was concern generated that she actually had a lower extremity wound. She arrives with a difficult to follow litany of complaints against the various compression garments we have supplied including I believe juxtalite stockings and 2 layer compression. She seems to have settled on wearing just one layer of the 2 layer compression. I'm really not certain at this point what Gudgel, Livianna C. ([id number]) her problem was with the juxtalite stockings 08/17/2015 -- she has been using some silver product on her lower extremity on the right and was concerned about that blackish discoloration. He has no open wounds. She has been wearing  some compression stockings which may be the 15-20 mm type. Objective Constitutional Pulse  regular. Respirations normal and unlabored. Afebrile. Eyes Nonicteric. Reactive to light. Ears, Nose, Mouth, and Throat Lips, teeth, and gums WNL.Marland Kitchen Moist mucosa without lesions. Neck supple and nontender. No palpable supraclavicular or cervical adenopathy. Normal sized without goiter. Respiratory WNL. No retractions.. Cardiovascular Pedal Pulses WNL. No clubbing, cyanosis or edema. Lymphatic No adneopathy. No adenopathy. No adenopathy. Musculoskeletal Adexa without tenderness or enlargement.. Digits and nails w/o clubbing, cyanosis, infection, petechiae, ischemia, or inflammatory conditions.Marland Kitchen Psychiatric Judgement and insight Intact.. No evidence of depression, anxiety, or agitation.. General Notes: she has stigmata of previous varicose veins but no open ulceration and the skin is intact and there is no evidence of cellulitis. Integumentary (Hair, Skin) No suspicious lesions. No crepitus or fluctuance. No peri-wound warmth or erythema. No masses.Marland Kitchen DASIA, JOHNCOX ([id number]) Assessment Active Problems ICD-10 E11.622 - Type 2 diabetes mellitus with other skin ulcer E11.610 - Type 2 diabetes mellitus with diabetic neuropathic arthropathy I87.331 - Chronic venous hypertension (idiopathic) with ulcer and inflammation of right lower extremity I have spent some time reassuring her that all is well with her leg and she should continue wearing her compression stockings every day for monitoring tonight. I've also told her to stop using the silver ointment and have recommended some bland skin lotions to help moisturize her leg. She has had all her questions answered to her satisfaction and will not have a return appointment unless she needs to come back. Plan I have spent some time reassuring her that all is well with her leg and she should continue wearing her compression stockings every day for  monitoring tonight. I've also told her to stop using the silver ointment and have recommended some bland skin lotions to help moisturize her leg. She has had all her questions answered to her satisfaction and will not have a return appointment unless she needs to come back. Electronic Signature(s) Signed: 08/17/2015 3:57:22 PM By: Christin Fudge MD, FACS Signed: 08/19/2015 7:56:10 AM By: Linton Ham MD Entered By: Christin Fudge on 08/17/2015 15:57:22 [place]calvo, [name] ([id number]) -------------------------------------------------------------------------------- SuperBill Details Patient Name: Slaven, Nicha C. Date of Service: 08/17/2015 Medical Record Patient Account Number: [id number] Number: Treating RN: Cornell Barman 11/27/1927 [id number] y.o. Other Clinician: Date of Birth/Sex: Female) Treating Kenzli Barritt Primary Care Physician/Extender: Ferd Glassing Physician: Weeks in Treatment: 52 Referring Physician: Ria Bush Diagnosis Coding ICD-10 Codes Code Description E11.622 Type 2 diabetes mellitus with other skin ulcer E11.610 Type 2 diabetes mellitus with diabetic neuropathic arthropathy Chronic venous hypertension (idiopathic) with ulcer and inflammation of right lower I87.331 extremity Facility Procedures CPT4 Code: [id number] Description: [id number] - WOUND CARE VISIT-LEV 1 EST PT Modifier: Quantity: 1 Physician Procedures CPT4: Description Modifier Quantity Code [id number] - WC PHYS LEVEL 3 - EST PT 1 ICD-10 Description Diagnosis E11.622 Type 2 diabetes mellitus with other skin ulcer E11.610 Type 2 diabetes mellitus with diabetic neuropathic arthropathy I87.331 Chronic  venous hypertension (idiopathic) with ulcer and inflammation of right lower extremity Electronic Signature(s) Signed: 08/18/2015 11:45:12 AM By: Gretta Cool, RN, BSN, Kim RN, BSN Signed: 08/19/2015 7:56:10 AM By: Linton Ham MD Previous Signature: 08/17/2015 3:57:57 PM Version By: Christin Fudge  MD, FACS Entered By: Gretta Cool, RN, BSN, Kim on 08/17/2015 16:14:35

## 2015-08-19 NOTE — Progress Notes (Signed)
MURLE, HEREFORD ([id number]) Visit Report for 08/17/2015 Arrival Information Details Patient Name: [name], [name] Date of Service: 08/17/2015 3:30 PM Medical Record Patient Account Number: [id number] Number: Treating RN: Cornell Barman 1927-06-29 (82 y.o. Other Clinician: Date of Birth/Sex: Female) Treating ROBSON, MICHAEL Primary Care Physician/Extender: Ferd Glassing Physician: Referring Physician: Donzetta Sprung in Treatment: 8 Visit Information History Since Last Visit Added or deleted any medications: No Patient Arrived: Walker Any new allergies or adverse reactions: No Arrival Time: 15:48 Had a fall or experienced change in No Accompanied By: son activities of daily living that may affect Transfer Assistance: None risk of falls: Patient Identification Verified: Yes Signs or symptoms of abuse/neglect since last No Secondary Verification Process Yes visito Completed: Hospitalized since last visit: No Patient Requires Transmission- No Has Compression in Place as Prescribed: Yes Based Precautions: Pain Present Now: No Patient Has Alerts: Yes Patient Alerts: Patient on Blood Thinner Type II Diabetic 81 MG aspirin Electronic Signature(s) Signed: 08/18/2015 11:45:12 AM By: Gretta Cool, RN, BSN, Kim RN, BSN Entered By: Gretta Cool, RN, BSN, Kim on 08/17/2015 16:05:54 Mutz, [name] ([id number]) -------------------------------------------------------------------------------- Clinic Level of Care Assessment Details Patient Name: Cupit, [name] Date of Service: 08/17/2015 3:30 PM Medical Record Patient Account Number: [id number] Number: Treating RN: Cornell Barman 1928-01-24 (82 y.o. Other Clinician: Date of Birth/Sex: Female) Treating ROBSON, MICHAEL Primary Care Physician/Extender: Ferd Glassing Physician: Referring Physician: Donzetta Sprung in Treatment: 52 Clinic Level of Care Assessment Items TOOL 4 Quantity Score []  -  Use when only an EandM is performed on FOLLOW-UP visit 0 ASSESSMENTS - Nursing Assessment / Reassessment []  - Reassessment of Co-morbidities (includes updates in patient status) 0 X - Reassessment of Adherence to Treatment Plan 1 5 ASSESSMENTS - Wound and Skin Assessment / Reassessment []  - Simple Wound Assessment / Reassessment - one wound 0 []  - Complex Wound Assessment / Reassessment - multiple wounds 0 []  - Dermatologic / Skin Assessment (not related to wound area) 0 ASSESSMENTS - Focused Assessment []  - Circumferential Edema Measurements - multi extremities 0 []  - Nutritional Assessment / Counseling / Intervention 0 []  - Lower Extremity Assessment (monofilament, tuning fork, pulses) 0 []  - Peripheral Arterial Disease Assessment (using hand held doppler) 0 ASSESSMENTS - Ostomy and/or Continence Assessment and Care []  - Incontinence Assessment and Management 0 []  - Ostomy Care Assessment and Management (repouching, etc.) 0 PROCESS - Coordination of Care X - Simple Patient / Family Education for ongoing care 1 15 []  - Complex (extensive) Patient / Family Education for ongoing care 0 []  - Staff obtains Consents, Records, Test Results / Process Orders 0 Keepers, Hollee C. ([id number]) []  - Staff telephones HHA, Nursing Homes / Clarify orders / etc 0 []  - Routine Transfer to another Facility (non-emergent condition) 0 []  - Routine Hospital Admission (non-emergent condition) 0 []  - New Admissions / Biomedical engineer / Ordering NPWT, Apligraf, etc. 0 []  - Emergency Hospital Admission (emergent condition) 0 X - Simple Discharge Coordination 1 10 []  - Complex (extensive) Discharge Coordination 0 PROCESS - Special Needs []  - Pediatric / Minor Patient Management 0 []  - Isolation Patient Management 0 []  - Hearing / Language / Visual special needs 0 []  - Assessment of Community assistance (transportation, D/C planning, etc.) 0 []  - Additional assistance / Altered mentation 0 []  -  Support Surface(s) Assessment (bed, cushion, seat, etc.) 0 INTERVENTIONS - Wound Cleansing / Measurement []  - Simple Wound Cleansing - one wound 0 []  - Complex Wound Cleansing -  multiple wounds 0 []  - Wound Imaging (photographs - any number of wounds) 0 []  - Wound Tracing (instead of photographs) 0 []  - Simple Wound Measurement - one wound 0 []  - Complex Wound Measurement - multiple wounds 0 INTERVENTIONS - Wound Dressings []  - Small Wound Dressing one or multiple wounds 0 []  - Medium Wound Dressing one or multiple wounds 0 []  - Large Wound Dressing one or multiple wounds 0 []  - Application of Medications - topical 0 []  - Application of Medications - injection 0 Kohl, Alayzia C. ([id number]) INTERVENTIONS - Miscellaneous []  - External ear exam 0 []  - Specimen Collection (cultures, biopsies, blood, body fluids, etc.) 0 []  - Specimen(s) / Culture(s) sent or taken to Lab for analysis 0 []  - Patient Transfer (multiple staff / Civil Service fast streamer / Similar devices) 0 []  - Simple Staple / Suture removal (25 or less) 0 []  - Complex Staple / Suture removal (26 or more) 0 []  - Hypo / Hyperglycemic Management (close monitor of Blood Glucose) 0 []  - Ankle / Brachial Index (ABI) - do not check if billed separately 0 X - Vital Signs 1 5 Has the patient been seen at the hospital within the last three years: Yes Total Score: 35 Level Of Care: New/Established - Level 1 Electronic Signature(s) Signed: 08/18/2015 11:45:12 AM By: Gretta Cool, RN, BSN, Kim RN, BSN Entered By: Gretta Cool, RN, BSN, Kim on 08/17/2015 16:11:02 Zettlemoyer, [name] ([id number]) -------------------------------------------------------------------------------- Lower Extremity Assessment Details Patient Name: Rossin, [name] Date of Service: 08/17/2015 3:30 PM Medical Record Patient Account Number: [id number] Number: Treating RN: Cornell Barman 1928/03/11 (82 y.o. Other Clinician: Date of Birth/Sex: Female) Treating ROBSON,  MICHAEL Primary Care Physician/Extender: Ferd Glassing Physician: Referring Physician: Donzetta Sprung in Treatment: 52 Vascular Assessment Pulses: Posterior Tibial Dorsalis Pedis Palpable: [Right:Yes] Extremity colors, hair growth, and conditions: Extremity Color: [Right:Mottled] Hair Growth on Extremity: [Right:No] Temperature of Extremity: [Right:Warm] Capillary Refill: [Right:< 3 seconds] Notes Dried skin and area of black over healed wound site. Patient is using a cream that contains silver, this is causing discoloration to her skin. Electronic Signature(s) Signed: 08/18/2015 11:45:12 AM By: Gretta Cool, RN, BSN, Kim RN, BSN Entered By: Gretta Cool, RN, BSN, Kim on 08/17/2015 16:08:29 Crystal, [name] ([id number]) -------------------------------------------------------------------------------- Pain Assessment Details Patient Name: Bruns, Miku C. Date of Service: 08/17/2015 3:30 PM Medical Record Patient Account Number: [id number] Number: Treating RN: Cornell Barman 25-Feb-1928 (82 y.o. Other Clinician: Date of Birth/Sex: Female) Treating ROBSON, MICHAEL Primary Care Physician/Extender: Ferd Glassing Physician: Referring Physician: Donzetta Sprung in Treatment: 52 Active Problems Location of Pain Severity and Description of Pain Patient Has [name] No Site Locations Pain Management and Medication Current Pain Management: Electronic Signature(s) Signed: 08/18/2015 11:45:12 AM By: Gretta Cool, RN, BSN, Kim RN, BSN Entered By: Gretta Cool, RN, BSN, Kim on 08/17/2015 16:06:02 Feigel, [name] ([id number]) -------------------------------------------------------------------------------- Vitals Details Patient Name: Bontrager, Kimbra C. Date of Service: 08/17/2015 3:30 PM Medical Record Patient Account Number: [id number] Number: Treating RN: Cornell Barman 1927/08/25 (82 y.o. Other Clinician: Date of Birth/Sex: Female) Treating ROBSON, MICHAEL Primary  Care Physician/Extender: Ferd Glassing Physician: Referring Physician: Donzetta Sprung in Treatment: 52 Vital Signs Time Taken: 15:50 Temperature (F): 97.8 Height (in): 58 Pulse (bpm): 78 Weight (lbs): 151 Respiratory Rate (breaths/min): 16 Body Mass Index (BMI): 31.6 Blood Pressure (mmHg): 120/70 Reference Range: 80 - 120 mg / dl Electronic Signature(s) Signed: 08/18/2015 11:45:12 AM By: Gretta Cool, RN, BSN, Kim RN, BSN Entered By: Gretta Cool, RN, BSN, Kim on  08/17/2015 16:06:35 

## 2015-09-08 ENCOUNTER — Ambulatory Visit (INDEPENDENT_AMBULATORY_CARE_PROVIDER_SITE_OTHER): Payer: Medicare Other | Admitting: Family Medicine

## 2015-09-08 ENCOUNTER — Encounter: Payer: Self-pay | Admitting: Family Medicine

## 2015-09-08 VITALS — BP 130/60 | HR 71 | Temp 97.9°F | Wt 150.8 lb

## 2015-09-08 DIAGNOSIS — I2510 Atherosclerotic heart disease of native coronary artery without angina pectoris: Secondary | ICD-10-CM | POA: Diagnosis not present

## 2015-09-08 DIAGNOSIS — I83222 Varicose veins of left lower extremity with both ulcer of calf and inflammation: Secondary | ICD-10-CM

## 2015-09-08 DIAGNOSIS — L97229 Non-pressure chronic ulcer of left calf with unspecified severity: Secondary | ICD-10-CM

## 2015-09-08 DIAGNOSIS — M545 Low back pain: Secondary | ICD-10-CM | POA: Diagnosis not present

## 2015-09-08 DIAGNOSIS — L97219 Non-pressure chronic ulcer of right calf with unspecified severity: Principal | ICD-10-CM

## 2015-09-08 DIAGNOSIS — I83212 Varicose veins of right lower extremity with both ulcer of calf and inflammation: Secondary | ICD-10-CM

## 2015-09-08 DIAGNOSIS — G8929 Other chronic pain: Secondary | ICD-10-CM

## 2015-09-08 MED ORDER — HYDROCODONE-ACETAMINOPHEN 5-325 MG PO TABS: 0.5000 | ORAL_TABLET | Freq: Four times a day (QID) | ORAL | Status: DC | PRN

## 2015-09-08 NOTE — Progress Notes (Signed)
Pre visit review using our clinic review tool, if applicable. No additional management support is needed unless otherwise documented below in the visit note. 

## 2015-09-08 NOTE — Assessment & Plan Note (Signed)
Hydrocodone refilled today.  

## 2015-09-08 NOTE — Patient Instructions (Addendum)
Keep your legs elevated, use compression as much as able.  Avoid salt in diet, drink plenty of water. You do have varicose veins with some inflammation - watch for worsening pain/swelling or other symptoms.  Continue aspirin daily.

## 2015-09-08 NOTE — Progress Notes (Signed)
BP 130/60 mmHg  Pulse 71  Temp(Src) 97.9 F (36.6 C)  Wt 150 lb 12.8 oz (68.402 kg)  SpO2 96%   CC: 2 mo f/u visit - "veins in my legs not doing well"  Subjective:    [name], female    DOB: 07-02-1927, 82 y.o.   MRN: [id number]  HPI: [name] is a 82 y.o. female presenting on 09/08/2015 for Follow-up   Today wearing light weight support hose. Also using eucerin cream.   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 Wound finally healed, she did need grafting of wound. Released from wound center 2 months ago (Dr Con Memos) - last visit concern for developing cellulitis, treated with keflex course.   She does wear light strength hose as well as heavier strength hose for 1/2 of the day.   She has hydrocodone for pain, takes 1/2 tab QID.   Ongoing fatigue, headaches, knee and leg pains.   She did undergo laser ablation of L great saphenous vein 03/2014 Kellie Simmering). On re evaluation 11/2014 - no further VVS interventions recommended. Suggested continued compression and dressing changes.   Relevant past medical, surgical, family and social history reviewed and updated as indicated. Interim medical history since our last visit reviewed. Allergies and medications reviewed and updated. Current Outpatient Prescriptions on File Prior to Visit  Medication Sig  . acidophilus (RISAQUAD) CAPS capsule Take 1 capsule by mouth daily.  Marland Kitchen ALPRAZolam (XANAX) 1 MG tablet Take 0.5 mg by mouth 4 (four) times daily as needed for anxiety.   Marland Kitchen aspirin EC 81 MG tablet Take 81 mg by mouth daily.  . cephALEXin (KEFLEX) 250 MG capsule Take 1 capsule (250 mg total) by mouth 3 (three) times daily.  Marland Kitchen Dexlansoprazole (DEXILANT) 30 MG capsule Take 1 capsule (30 mg total) by mouth daily.  . ferrous sulfate 325 (65 FE) MG tablet Take 1 tablet (325 mg total) by mouth daily with breakfast.  . furosemide (LASIX) 20 MG tablet Take 20 mg by mouth daily.    Marland Kitchen glucosamine-chondroitin 500-400 MG tablet Take 1 tablet by mouth daily.   Marland Kitchen loperamide (IMODIUM) 2 MG capsule Take 2 mg by mouth 4 (four) times daily as needed for diarrhea or loose stools.  . nitroGLYCERIN (NITROSTAT) 0.4 MG SL tablet Place 0.4 mg under the tongue every 5 (five) minutes as needed for chest pain.   Marland Kitchen nystatin-triamcinolone ointment (MYCOLOG) Apply 1 application topically 2 (two) times daily as needed (for itching).  . pioglitazone (ACTOS) 30 MG tablet Take 30 mg by mouth every evening.   . pravastatin (PRAVACHOL) 20 MG tablet Take 20 mg by mouth at bedtime.  . ramipril (ALTACE) 5 MG capsule Take 5 mg by mouth every evening.   . traZODone (DESYREL) 150 MG tablet Take 150 mg by mouth at bedtime.   . triamcinolone cream (KENALOG) 0.1 % APPLY 1 APPLICATION TOPICALLY 2 (TWO) TIMES DAILY.  . vitamin B-12 (CYANOCOBALAMIN) 1000 MCG tablet Take 1,000 mcg by mouth daily.  . vitamin E 400 UNIT capsule Take 400 Units by mouth daily.   No current facility-administered medications on file prior to visit.    Review of Systems Per HPI unless specifically indicated in ROS section     Objective:    BP 130/60 mmHg  Pulse 71  Temp(Src) 97.9 F (36.6 C)  Wt 150 lb 12.8 oz (68.402 kg)  SpO2 96%  Wt Readings from Last 3 Encounters:  09/08/15 150 lb 12.8 oz (68.402 kg)  07/09/15 152 lb 8 oz (69.174 kg)  06/29/15 150 lb (68.04 kg)    Physical Exam  Constitutional: She appears well-developed and well-nourished. No distress.  Musculoskeletal: She exhibits no edema.  Skin: Skin is warm and dry. No rash noted.  Venous insufficiency changes BLE Marked varicose veins BLE Small scab R posterior calf at site of superficial vein  Nursing note and vitals reviewed.     Assessment & Plan:  RTC for AMW as due.  Problem List Items Addressed This Visit    Chronic lower back pain    Hydrocodone refilled today.      Relevant Medications   HYDROcodone-acetaminophen (NORCO/VICODIN) 5-325  MG tablet   Varicose veins of right lower extremity with both ulcer of calf and inflammation ([facility]) - Primary   Varicose veins of left lower extremity with both ulcer of calf and inflammation ([facility])      Overall stable BLE. Tried to reassure patient that legs doing well. No signs of active infection although she does have known ongoing inflammation of varicosities. Discussed leg care to include ongoing compression stocking use, elevation while off her feet, avoidance of salt and good water intake. Continue to monitor.   Follow up plan: Return as needed.  Ria Bush, MD

## 2015-09-18 ENCOUNTER — Telehealth: Payer: Self-pay

## 2015-09-18 NOTE — Telephone Encounter (Signed)
Pt walked in;Pt was seen on 09/08/15 with varicose vein and red area at rt ankle. pt concerned that varicose vein on lower rt leg is larger and pt is afraid will burst; also red area at rt ankle is sore now. Pt said no drainage from varicose vein on rt lower leg but appears to have scab on area where pt is concerned. No available appts at [facility]; pt only wants to see Dr Darnell Level. Pt does not want to go to UC, ED or other LB facility. Dr Darnell Level advised could see pt on 09/21/15;for pt to continue wearing compression stockings (which pt does not have on now) and elevate her legs. pt scheduled 30 min appt on 09/21/15; pt is afraid she cannot wait until Monday. Pt wants to talk with her son to see what is best to do. Pt cannot call him now because he is working and pt will talk with her son this afternoon. Advised pt if condition changes or worsens prior to appt to go to [facility] or ED. Pt voiced understanding but did not know what she was going to do.FYI to Dr Darnell Level.

## 2015-09-21 ENCOUNTER — Encounter: Payer: Self-pay | Admitting: Family Medicine

## 2015-09-21 ENCOUNTER — Ambulatory Visit (INDEPENDENT_AMBULATORY_CARE_PROVIDER_SITE_OTHER): Payer: Medicare Other | Admitting: Family Medicine

## 2015-09-21 VITALS — BP 140/80 | HR 80 | Temp 97.6°F | Wt 153.0 lb

## 2015-09-21 DIAGNOSIS — W57XXXA Bitten or stung by nonvenomous insect and other nonvenomous arthropods, initial encounter: Secondary | ICD-10-CM | POA: Diagnosis not present

## 2015-09-21 DIAGNOSIS — J069 Acute upper respiratory infection, unspecified: Secondary | ICD-10-CM | POA: Diagnosis not present

## 2015-09-21 DIAGNOSIS — T148 Other injury of unspecified body region: Secondary | ICD-10-CM

## 2015-09-21 DIAGNOSIS — I2510 Atherosclerotic heart disease of native coronary artery without angina pectoris: Secondary | ICD-10-CM | POA: Diagnosis not present

## 2015-09-21 DIAGNOSIS — L97219 Non-pressure chronic ulcer of right calf with unspecified severity: Principal | ICD-10-CM

## 2015-09-21 DIAGNOSIS — I83212 Varicose veins of right lower extremity with both ulcer of calf and inflammation: Secondary | ICD-10-CM | POA: Diagnosis not present

## 2015-09-21 DIAGNOSIS — S41111A Laceration without foreign body of right upper arm, initial encounter: Secondary | ICD-10-CM | POA: Diagnosis not present

## 2015-09-21 DIAGNOSIS — S41119A Laceration without foreign body of unspecified upper arm, initial encounter: Secondary | ICD-10-CM | POA: Insufficient documentation

## 2015-09-21 NOTE — Assessment & Plan Note (Addendum)
Anticipate mosquito bite. Reassurance provided. rec cool compress and cortisone OTC PRN.

## 2015-09-21 NOTE — Assessment & Plan Note (Signed)
Superficial varicose vein that has become irritated. No signs of infection at this time, no active bleeding or oozing. Treat with localized compression wrap as patient refuses unna boot.  rec f/u with provider in office later this week as I will be out of town. May f/u with me on Monday.  Alternatively consider duoderm artificial skin on follow up.

## 2015-09-21 NOTE — Progress Notes (Signed)
BP 140/80 mmHg  Pulse 80  Temp(Src) 97.6 F (36.4 C) (Tympanic)  Wt 153 lb (69.4 kg)   CC: check spot on leg  Subjective:    [name], female    DOB: 10/08/1927, 82 y.o.   MRN: [id number]  HPI: [name] is a 82 y.o. female presenting on 09/21/2015 for Varicose Veins and Extremity Laceration   Known varicose veins bilaterally, h/o nonhealing ulcers due to this previously followed by wound clinic (see below). Presents today very concerned about spot on right leg that is painful and with scab.   She has been wearing light compression stockings. States heavy compression bruises legs.   She also had right arm laceration suffered on Friday afternoon. She slipped and fell down at her bathroom. Suffered abrasion R upper arm. Self treating with daily tegaderm dressing changes.   Also suffering from cold. Congestion, sneezing, mild cough. No fevers.   Followed until recently by wound center for chronic poorly healing L ankle wound from CVI. Wound finally healed, she did need grafting of wound. Released from wound center 2 months ago (Dr Con Memos)  S/p endovenous saphenous vein ablation Kellie Simmering) By latest VVS evaluation: No arterial or venous options available from vascular standpoint or treatment of right leg stasis ulcer.Continued dressing changes and compression is the treatment of choice  Relevant past medical, surgical, family and social history reviewed and updated as indicated. Interim medical history since our last visit reviewed. Allergies and medications reviewed and updated. Current Outpatient Prescriptions on File Prior to Visit  Medication Sig  . acidophilus (RISAQUAD) CAPS capsule Take 1 capsule by mouth daily.  Marland Kitchen ALPRAZolam (XANAX) 1 MG tablet Take 0.5 mg by mouth 4 (four) times daily as needed for anxiety.   Marland Kitchen aspirin EC 81 MG tablet Take 81 mg by mouth daily.  . cephALEXin (KEFLEX) 250 MG capsule Take 1 capsule (250 mg total) by mouth 3 (three) times  daily.  Marland Kitchen Dexlansoprazole (DEXILANT) 30 MG capsule Take 1 capsule (30 mg total) by mouth daily.  . ferrous sulfate 325 (65 FE) MG tablet Take 1 tablet (325 mg total) by mouth daily with breakfast.  . furosemide (LASIX) 20 MG tablet Take 20 mg by mouth daily.  Marland Kitchen glucosamine-chondroitin 500-400 MG tablet Take 1 tablet by mouth daily.   Marland Kitchen HYDROcodone-acetaminophen (NORCO/VICODIN) 5-325 MG tablet Take 0.5-1 tablets by mouth every 6 (six) hours as needed for moderate pain.  Marland Kitchen loperamide (IMODIUM) 2 MG capsule Take 2 mg by mouth 4 (four) times daily as needed for diarrhea or loose stools.  . nitroGLYCERIN (NITROSTAT) 0.4 MG SL tablet Place 0.4 mg under the tongue every 5 (five) minutes as needed for chest pain.   Marland Kitchen nystatin-triamcinolone ointment (MYCOLOG) Apply 1 application topically 2 (two) times daily as needed (for itching).  . pioglitazone (ACTOS) 30 MG tablet Take 30 mg by mouth every evening.   . pravastatin (PRAVACHOL) 20 MG tablet Take 20 mg by mouth at bedtime.  . ramipril (ALTACE) 5 MG capsule Take 5 mg by mouth every evening.   . traZODone (DESYREL) 150 MG tablet Take 150 mg by mouth at bedtime.   . triamcinolone cream (KENALOG) 0.1 % APPLY 1 APPLICATION TOPICALLY 2 (TWO) TIMES DAILY.  . vitamin B-12 (CYANOCOBALAMIN) 1000 MCG tablet Take 1,000 mcg by mouth daily.  . vitamin E 400 UNIT capsule Take 400 Units by mouth daily.   No current facility-administered medications on file prior to visit.    Review of  Systems Per HPI unless specifically indicated in ROS section     Objective:    BP 140/80 mmHg  Pulse 80  Temp(Src) 97.6 F (36.4 C) (Tympanic)  Wt 153 lb (69.4 kg)  Wt Readings from Last 3 Encounters:  09/21/15 153 lb (69.4 kg)  09/08/15 150 lb 12.8 oz (68.402 kg)  07/09/15 152 lb 8 oz (69.174 kg)    Physical Exam  Constitutional: She appears well-developed and well-nourished. No distress.  Musculoskeletal: She exhibits no edema.  R upper arm with skin tear cleaned with  NS and dressed with bandage.  R proximal anterior forearm with mild erythema after bug bite, pruritic R posterior calf with pinpoint varicose vein that has reached surface and bled a Bracy without surrounding irritation.  Known varicose veins without significant pedal edema today 2+ DP on right today.   Skin: Skin is warm and dry. No rash noted. No erythema.  Psychiatric: Her mood appears anxious.  Nursing note and vitals reviewed.     Assessment & Plan:   Problem List Items Addressed This Visit    Varicose veins of right lower extremity with both ulcer of calf and inflammation ([place]) - Primary    Superficial varicose vein that has become irritated. No signs of infection at this time, no active bleeding or oozing. Treat with localized compression wrap as patient refuses unna boot.  rec f/u with provider in office later this week as I will be out of town. May f/u with me on Monday.  Alternatively consider duoderm artificial skin on follow up.       Bug bite    Anticipate mosquito bite. Reassurance provided. rec cool compress and cortisone OTC PRN.       Skin tear of upper arm without complication    Dressed today. No signs of infection.       Viral URI    Encouraged rest, fluids to treat common cold.           Follow up plan: Return in about 3 days (around 09/24/2015).  [name] Bush, MD

## 2015-09-21 NOTE — Patient Instructions (Addendum)
For bug bite - use cool compresses and may use cortisone cream as needed. For arm abrasion - continue dressing changes with tegaderm. Dressed today.  For leg wound - placed in compression dressing today, keep on until follow up on Thursday or Friday.  Push fluids and rest for cold.

## 2015-09-21 NOTE — Assessment & Plan Note (Signed)
Dressed today. No signs of infection.

## 2015-09-21 NOTE — Progress Notes (Signed)
Pre visit review using our clinic review tool, if applicable. No additional management support is needed unless otherwise documented below in the visit note. 

## 2015-09-21 NOTE — Assessment & Plan Note (Signed)
Encouraged rest, fluids to treat common cold.

## 2015-09-24 ENCOUNTER — Ambulatory Visit (INDEPENDENT_AMBULATORY_CARE_PROVIDER_SITE_OTHER)
Admission: RE | Admit: 2015-09-24 | Discharge: 2015-09-24 | Disposition: A | Discharge status: Home / Self Care | Payer: Medicare Other | Source: Ambulatory Visit | Attending: Primary Care | Admitting: Primary Care

## 2015-09-24 ENCOUNTER — Ambulatory Visit (INDEPENDENT_AMBULATORY_CARE_PROVIDER_SITE_OTHER): Payer: Medicare Other | Admitting: Primary Care

## 2015-09-24 VITALS — BP 130/64 | HR 84 | Temp 97.9°F | Wt 154.8 lb

## 2015-09-24 DIAGNOSIS — I2510 Atherosclerotic heart disease of native coronary artery without angina pectoris: Secondary | ICD-10-CM | POA: Diagnosis not present

## 2015-09-24 DIAGNOSIS — S4991XA Unspecified injury of right shoulder and upper arm, initial encounter: Secondary | ICD-10-CM | POA: Diagnosis not present

## 2015-09-24 DIAGNOSIS — M79601 Pain in right arm: Secondary | ICD-10-CM | POA: Diagnosis not present

## 2015-09-24 DIAGNOSIS — M79621 Pain in right upper arm: Secondary | ICD-10-CM | POA: Diagnosis not present

## 2015-09-24 NOTE — Progress Notes (Signed)
Pre visit review using our clinic review tool, if applicable. No additional management support is needed unless otherwise documented below in the visit note. 

## 2015-09-24 NOTE — Patient Instructions (Addendum)
Complete xray(s) prior to leaving today. I will notify you of your results once received.  Your leg does not appear infected. Please notify Dr. Danise Mina if you notice pain, swelling, redness, drainage.  Your symptoms sound like the common cold. Your lungs are clear and you do not have a fever.  You may take Tylenol for any body aches, chills, fevers. Do not exceed 3000 mg in 24 hours.  Please notify me if you develop persistent fevers of 101, start coughing up green mucous, notice increased fatigue or weakness, or feel worse after 1 week of onset of symptoms.   Increase consumption of water intake and rest.  It was a pleasure meeting you!

## 2015-09-24 NOTE — Progress Notes (Signed)
Subjective:    [name], female    DOB: 03-Oct-1927, 82 y.o.   MRN: [id number]  HPI  [name] is an 82 year old female who presents today for recheck to her lower extremities. She was evaluated on 06/15 by PCP who recommended re-evaluation of a superficial varicose vein that had become irritated (history of non healing ulcers). She had no s/s of acute infection at the time. She was advised to treat with localized compression wrap.  She is a poor historian and is jumping around consistently during appointment today with numerous complaints.  During her last visit she also reported congestion, sneezing, mild cough. She was diagnosed with a viral URI and treated with conservative measures. She reports fevers (although has not checked her temperature), chills, fatigue. Denies cough, nausea, shortness of breath. She is unsure how long her symptoms have been present. She has been taking Tylenol with some improvement.  She is also reporting pain to her right humerus that has been present since fall that she believes occurred on Tuesday this week. Denies headaches, syncope, chest wall pain, weakness. She is mostly concerned about her right humerus pain today.  Review of Systems  Constitutional: Positive for chills and fatigue. Negative for fever.  HENT: Positive for congestion, postnasal drip and sneezing.   Respiratory: Negative for cough, shortness of breath and wheezing.   Cardiovascular: Negative for chest pain.  Gastrointestinal: Negative for nausea.  Musculoskeletal: Positive for arthralgias.       Past Medical History  Diagnosis Date  . Hypertension   . Coronary artery disease     CABG 1998  . Anxiety   . Diastolic dysfunction   . Hyperlipidemia   . PUD (peptic ulcer disease)   . Microcytic anemia   . Depression   . Diabetes mellitus     type 2, followed by endo.  . Diverticulosis   . Hemorrhoids   . History of heart attack   . Irritable bowel syndrome     . Chronic back pain 03/2012    lumbar DDD with herniation - s/p L S1 transforaminal ESI (10/2012 Dr. Sharlet Salina)  . Varicose veins   . Cystocele   . Hx of echocardiogram     a. Echo 10/13: mild LVH, EF 55-60%, Gr 1 diast dysfn, PASP 32  . Acute deep vein thrombosis (DVT) of distal vein of right lower extremity ([place]) 12/2013    Acute thrombus of R proximal gastrocnemius vein. Symptomatic provoked distal DVT  . Cellulitis of leg, left 01/16/2014  . PAD (peripheral artery disease) ([place]) 2013    ABI: R 0.91, L 0.83  . Chronic venous insufficiency   . Ankle wound   . Viral gastroenteritis due to [place] virus 05/13/2015     Social History   Social History  . Marital Status: Single    Spouse Name: N/A  . Number of Children: N/A  . Years of Education: N/A   Occupational History  . Waitress     Social History Main Topics  . Smoking status: Former Smoker    Quit date: 03/05/1979  . Smokeless tobacco: Never Used  . Alcohol Use: No  . Drug Use: No  . Sexual Activity: No     Comment: 1st intercourse 82yo.--- 2 sexual partners   Other Topics Concern  . 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   Independent for all ADLs, lives with a boyfriend.  Past Surgical History  Procedure Laterality Date  . Tympanoplasty  1960s    R side  . Coronary artery bypass graft  1998  . Cataract surgery    . Cholecystectomy    . Appendectomy      per pt report  . Colonoscopy  02/2010    extensive diverticulosis throughout, small internal hemorrhoids, rec rpt 5 yrs (Dr. Allyn Kenner)  . Hand surgery    . Lexiscan myoview  03/2011    Small basal inferolateral and anterolateral reversible perfusion defect suggests ischemia.  EF was normal.   old infarct, no new ischemia  . Toe surgery    . Abdominal hysterectomy  1975    TAH,BSO  . Vault prolapse a & p repair  2009    Dr [facility] hospital  . Dexa scan  09/2011    femur -0.8, forearm 0.6 => normal  .  Esi  2014    L S1 transforaminal ESI x2 ([facility]) without improvement  . Abi  2013    R 0.91, L 0.83  . Endovenous ablation saphenous vein w/ laser Left 03/2014    Kellie Simmering  . Cardiovascular stress test  2015    low risk myoview ([facility])    Family History  Problem Relation Age of Onset  . Hypertension Mother   . Stroke Mother   . Diabetes Father   . Diabetes Sister   . Diabetes Brother   . Colon cancer Neg Hx     Allergies  Allergen Reactions  . Amoxicillin-Pot Clavulanate Other (See Comments)    Reaction:  Unknown   . Metformin And Related Diarrhea    Current Outpatient Prescriptions on File Prior to Visit  Medication Sig Dispense Refill  . acidophilus (RISAQUAD) CAPS capsule Take 1 capsule by mouth daily.    Marland Kitchen ALPRAZolam (XANAX) 1 MG tablet Take 0.5 mg by mouth 4 (four) times daily as needed for anxiety.     Marland Kitchen aspirin EC 81 MG tablet Take 81 mg by mouth daily.    Marland Kitchen Dexlansoprazole (DEXILANT) 30 MG capsule Take 1 capsule (30 mg total) by mouth daily. 30 capsule 11  . ferrous sulfate 325 (65 FE) MG tablet Take 1 tablet (325 mg total) by mouth daily with breakfast.    . furosemide (LASIX) 20 MG tablet Take 20 mg by mouth daily.    Marland Kitchen glucosamine-chondroitin 500-400 MG tablet Take 1 tablet by mouth daily.     Marland Kitchen HYDROcodone-acetaminophen (NORCO/VICODIN) 5-325 MG tablet Take 0.5-1 tablets by mouth every 6 (six) hours as needed for moderate pain. 60 tablet 0  . loperamide (IMODIUM) 2 MG capsule Take 2 mg by mouth 4 (four) times daily as needed for diarrhea or loose stools.    . nitroGLYCERIN (NITROSTAT) 0.4 MG SL tablet Place 0.4 mg under the tongue every 5 (five) minutes as needed for chest pain.     Marland Kitchen nystatin-triamcinolone ointment (MYCOLOG) Apply 1 application topically 2 (two) times daily as needed (for itching).    . pioglitazone (ACTOS) 30 MG tablet Take 30 mg by mouth every evening.     . pravastatin (PRAVACHOL) 20 MG tablet Take 20 mg by mouth at bedtime.    . ramipril  (ALTACE) 5 MG capsule Take 5 mg by mouth every evening.     . traZODone (DESYREL) 150 MG tablet Take 150 mg by mouth at bedtime.     . triamcinolone cream (KENALOG) 0.1 % APPLY 1 APPLICATION TOPICALLY 2 (TWO) TIMES DAILY.  1  . vitamin B-12 (  CYANOCOBALAMIN) 1000 MCG tablet Take 1,000 mcg by mouth daily.    . vitamin E 400 UNIT capsule Take 400 Units by mouth daily.    . cephALEXin (KEFLEX) 250 MG capsule Take 1 capsule (250 mg total) by mouth 3 (three) times daily. (Patient not taking: Reported on 09/24/2015) 21 capsule 0   No current facility-administered medications on file prior to visit.    BP 130/64 mmHg  Pulse 84  Temp(Src) 97.9 F (36.6 C) (Oral)  Wt 154 lb 12.8 oz (70.217 kg)  SpO2 96%    Objective:   Physical Exam  Constitutional: She appears well-nourished.  HENT:  Right Ear: Tympanic membrane and ear canal normal.  Left Ear: Tympanic membrane and ear canal normal.  Nose: Right sinus exhibits no maxillary sinus tenderness and no frontal sinus tenderness. Left sinus exhibits no maxillary sinus tenderness and no frontal sinus tenderness.  Mouth/Throat: Oropharynx is clear and moist.  Eyes: Conjunctivae are normal.  Neck: Neck supple.  Cardiovascular: Normal rate and regular rhythm.   Pulmonary/Chest: Effort normal and breath sounds normal. She has no wheezes. She has no rales.  Musculoskeletal:  Right upper extremity tenderness upon palpation to the proximal humeral bone. Skin intact with bruising present.  Lymphadenopathy:    She has no cervical adenopathy.  Skin: Skin is warm and dry.  Varicose vein appears to be without irritation, erythema, drainage. No signs or symptoms of acute infection to either lower extremity.  Psychiatric:  Flighty during exam, tearful.          Assessment & Plan:  Varicose vein irritation:  Appears to be improved based off of last visit report. No erythema, open wound, tenderness. Scabbing noted to site of irritation. No signs or  symptoms of acute infection.. Dressing applied in the office today for protection. Warning signs and return precautions provided to patient in regards to acute infection.  Right upper extremity pain:  Present since fall that she believes occurred on Tuesday? She is a poor historian but does have moderate tenderness to right proximal humeral bone with bruising to site. Will x-ray to ensure no fracture or abnormality. Discussed conservative treatment at this point and will progress with treatment if her if x-ray positive.  Viral URI:  Congestion, sneezing, fevers, chills for several days. She has not checked her temperature and she has no fever today in the office. She does not appear acutely ill and respiratory examination was unremarkable. Discussed conservative treatment for allergy symptoms and encouraged her to purchase a thermometer and notify us if she develops fevers of 101, develops a productive cough, starts to feel worse.

## 2015-09-25 ENCOUNTER — Telehealth: Payer: Self-pay | Admitting: Family Medicine

## 2015-09-25 ENCOUNTER — Other Ambulatory Visit: Payer: Self-pay | Admitting: Primary Care

## 2015-09-25 DIAGNOSIS — M75101 Unspecified rotator cuff tear or rupture of right shoulder, not specified as traumatic: Secondary | ICD-10-CM

## 2015-09-25 NOTE — Telephone Encounter (Signed)
Noted and agree. Patient evaluated yesterday and did not appear or sound acutely ill. She was a poor historian, very flighty, had numerous complaints, had not checked her temperatures (afebrile in the clinic).

## 2015-09-25 NOTE — Telephone Encounter (Signed)
Patient Name: [name] DOB: 04-Oct-1927 Initial Comment chills, fever, hard to move around without assistance, saw dr yesterday. bad phone connection, can hardly hear her, she does have someone else with her that can relay information and is a bit eaiser to hear. Nurse Assessment Nurse: Ronnald Ramp, RN, [place] Date/Time (Eastern Time): 09/25/2015 12:22:50 PM Confirm and document reason for call. If symptomatic, describe symptoms. You must click the next button to save text entered. ---Caller states she was seen by MD yesterday for fever, chills, headache, and right arm pain. She continues to have symptoms. She has not gotten a thermometer to check her temp. She cannot describe any specific complaints that are new. Has the patient traveled out of the country within the last 30 days? ---Not Applicable Does the patient have any new or worsening symptoms? ---No Please document clinical information provided and list any resource used. ---Reviewed notes from MD visit yesterday. Pt was told that she had a viral URI and to get a thermometer and call back if temp >101 or coughing. They did an x-ray of her right arm yesterday and they called her today and told her she has a "chronic rotator cuff tear". Told to get a sling and referred to orthopedics. Told caller that there are not any appts available at the clinic for today and is she felt she needed to be seen to go to UC. Caller then states she has to go to the bathroom and hung up. Guidelines Guideline Title Affirmed Question Affirmed Notes Final Disposition User Clinical Call Ronnald Ramp, RN, [employer]

## 2015-09-27 ENCOUNTER — Emergency Department (HOSPITAL_COMMUNITY): Payer: Medicare Other

## 2015-09-27 ENCOUNTER — Inpatient Hospital Stay (HOSPITAL_COMMUNITY)
Admission: EM | Admit: 2015-09-27 | Discharge: 2015-10-05 | DRG: 291 | Disposition: A | Discharge status: Home Health | Payer: Medicare Other | Attending: Internal Medicine | Admitting: Internal Medicine

## 2015-09-27 ENCOUNTER — Encounter (HOSPITAL_COMMUNITY): Payer: Self-pay | Admitting: Emergency Medicine

## 2015-09-27 ENCOUNTER — Other Ambulatory Visit: Payer: Self-pay

## 2015-09-27 DIAGNOSIS — J9601 Acute respiratory failure with hypoxia: Secondary | ICD-10-CM | POA: Diagnosis present

## 2015-09-27 DIAGNOSIS — I959 Hypotension, unspecified: Secondary | ICD-10-CM | POA: Diagnosis present

## 2015-09-27 DIAGNOSIS — D649 Anemia, unspecified: Secondary | ICD-10-CM | POA: Diagnosis present

## 2015-09-27 DIAGNOSIS — Z8249 Family history of ischemic heart disease and other diseases of the circulatory system: Secondary | ICD-10-CM

## 2015-09-27 DIAGNOSIS — E1151 Type 2 diabetes mellitus with diabetic peripheral angiopathy without gangrene: Secondary | ICD-10-CM | POA: Diagnosis present

## 2015-09-27 DIAGNOSIS — I83009 Varicose veins of unspecified lower extremity with ulcer of unspecified site: Secondary | ICD-10-CM | POA: Diagnosis present

## 2015-09-27 DIAGNOSIS — M549 Dorsalgia, unspecified: Secondary | ICD-10-CM | POA: Diagnosis present

## 2015-09-27 DIAGNOSIS — F411 Generalized anxiety disorder: Secondary | ICD-10-CM | POA: Diagnosis present

## 2015-09-27 DIAGNOSIS — I480 Paroxysmal atrial fibrillation: Secondary | ICD-10-CM | POA: Insufficient documentation

## 2015-09-27 DIAGNOSIS — F329 Major depressive disorder, single episode, unspecified: Secondary | ICD-10-CM | POA: Diagnosis present

## 2015-09-27 DIAGNOSIS — F0390 Unspecified dementia without behavioral disturbance: Secondary | ICD-10-CM | POA: Diagnosis present

## 2015-09-27 DIAGNOSIS — M25551 Pain in right hip: Secondary | ICD-10-CM | POA: Diagnosis not present

## 2015-09-27 DIAGNOSIS — R3915 Urgency of urination: Secondary | ICD-10-CM | POA: Insufficient documentation

## 2015-09-27 DIAGNOSIS — I248 Other forms of acute ischemic heart disease: Secondary | ICD-10-CM | POA: Diagnosis present

## 2015-09-27 DIAGNOSIS — I872 Venous insufficiency (chronic) (peripheral): Secondary | ICD-10-CM | POA: Diagnosis not present

## 2015-09-27 DIAGNOSIS — I252 Old myocardial infarction: Secondary | ICD-10-CM

## 2015-09-27 DIAGNOSIS — I110 Hypertensive heart disease with heart failure: Secondary | ICD-10-CM | POA: Diagnosis present

## 2015-09-27 DIAGNOSIS — M25552 Pain in left hip: Secondary | ICD-10-CM | POA: Diagnosis not present

## 2015-09-27 DIAGNOSIS — E785 Hyperlipidemia, unspecified: Secondary | ICD-10-CM | POA: Diagnosis not present

## 2015-09-27 DIAGNOSIS — F419 Anxiety disorder, unspecified: Secondary | ICD-10-CM | POA: Diagnosis present

## 2015-09-27 DIAGNOSIS — E860 Dehydration: Secondary | ICD-10-CM | POA: Diagnosis present

## 2015-09-27 DIAGNOSIS — E440 Moderate protein-calorie malnutrition: Secondary | ICD-10-CM | POA: Diagnosis present

## 2015-09-27 DIAGNOSIS — G9340 Encephalopathy, unspecified: Secondary | ICD-10-CM | POA: Diagnosis present

## 2015-09-27 DIAGNOSIS — R000 Tachycardia, unspecified: Secondary | ICD-10-CM | POA: Diagnosis not present

## 2015-09-27 DIAGNOSIS — R197 Diarrhea, unspecified: Secondary | ICD-10-CM | POA: Diagnosis not present

## 2015-09-27 DIAGNOSIS — S199XXA Unspecified injury of neck, initial encounter: Secondary | ICD-10-CM | POA: Diagnosis not present

## 2015-09-27 DIAGNOSIS — Z951 Presence of aortocoronary bypass graft: Secondary | ICD-10-CM

## 2015-09-27 DIAGNOSIS — I5033 Acute on chronic diastolic (congestive) heart failure: Secondary | ICD-10-CM | POA: Diagnosis not present

## 2015-09-27 DIAGNOSIS — Z881 Allergy status to other antibiotic agents status: Secondary | ICD-10-CM

## 2015-09-27 DIAGNOSIS — I5030 Unspecified diastolic (congestive) heart failure: Secondary | ICD-10-CM | POA: Diagnosis present

## 2015-09-27 DIAGNOSIS — E1159 Type 2 diabetes mellitus with other circulatory complications: Secondary | ICD-10-CM | POA: Diagnosis present

## 2015-09-27 DIAGNOSIS — L97909 Non-pressure chronic ulcer of unspecified part of unspecified lower leg with unspecified severity: Secondary | ICD-10-CM

## 2015-09-27 DIAGNOSIS — E1142 Type 2 diabetes mellitus with diabetic polyneuropathy: Secondary | ICD-10-CM | POA: Diagnosis present

## 2015-09-27 DIAGNOSIS — N390 Urinary tract infection, site not specified: Secondary | ICD-10-CM | POA: Diagnosis present

## 2015-09-27 DIAGNOSIS — I4892 Unspecified atrial flutter: Secondary | ICD-10-CM | POA: Diagnosis not present

## 2015-09-27 DIAGNOSIS — Z888 Allergy status to other drugs, medicaments and biological substances status: Secondary | ICD-10-CM

## 2015-09-27 DIAGNOSIS — Z833 Family history of diabetes mellitus: Secondary | ICD-10-CM

## 2015-09-27 DIAGNOSIS — R0989 Other specified symptoms and signs involving the circulatory and respiratory systems: Secondary | ICD-10-CM

## 2015-09-27 DIAGNOSIS — I481 Persistent atrial fibrillation: Secondary | ICD-10-CM | POA: Diagnosis not present

## 2015-09-27 DIAGNOSIS — B9620 Unspecified Escherichia coli [E. coli] as the cause of diseases classified elsewhere: Secondary | ICD-10-CM | POA: Diagnosis present

## 2015-09-27 DIAGNOSIS — Z823 Family history of stroke: Secondary | ICD-10-CM

## 2015-09-27 DIAGNOSIS — M79605 Pain in left leg: Secondary | ICD-10-CM | POA: Diagnosis not present

## 2015-09-27 DIAGNOSIS — M79604 Pain in right leg: Secondary | ICD-10-CM | POA: Diagnosis not present

## 2015-09-27 DIAGNOSIS — I83813 Varicose veins of bilateral lower extremities with pain: Secondary | ICD-10-CM | POA: Diagnosis present

## 2015-09-27 DIAGNOSIS — Z87891 Personal history of nicotine dependence: Secondary | ICD-10-CM

## 2015-09-27 DIAGNOSIS — R4182 Altered mental status, unspecified: Secondary | ICD-10-CM | POA: Diagnosis not present

## 2015-09-27 DIAGNOSIS — Z880 Allergy status to penicillin: Secondary | ICD-10-CM

## 2015-09-27 DIAGNOSIS — R1310 Dysphagia, unspecified: Secondary | ICD-10-CM

## 2015-09-27 DIAGNOSIS — I272 Other secondary pulmonary hypertension: Secondary | ICD-10-CM | POA: Diagnosis present

## 2015-09-27 DIAGNOSIS — M79606 Pain in leg, unspecified: Secondary | ICD-10-CM

## 2015-09-27 DIAGNOSIS — I8390 Asymptomatic varicose veins of unspecified lower extremity: Secondary | ICD-10-CM | POA: Diagnosis present

## 2015-09-27 DIAGNOSIS — K580 Irritable bowel syndrome with diarrhea: Secondary | ICD-10-CM | POA: Diagnosis present

## 2015-09-27 DIAGNOSIS — Z86718 Personal history of other venous thrombosis and embolism: Secondary | ICD-10-CM

## 2015-09-27 DIAGNOSIS — N179 Acute kidney failure, unspecified: Secondary | ICD-10-CM | POA: Diagnosis present

## 2015-09-27 DIAGNOSIS — K279 Peptic ulcer, site unspecified, unspecified as acute or chronic, without hemorrhage or perforation: Secondary | ICD-10-CM | POA: Diagnosis present

## 2015-09-27 DIAGNOSIS — G8929 Other chronic pain: Secondary | ICD-10-CM | POA: Diagnosis present

## 2015-09-27 DIAGNOSIS — G3184 Mild cognitive impairment, so stated: Secondary | ICD-10-CM | POA: Diagnosis present

## 2015-09-27 DIAGNOSIS — J9600 Acute respiratory failure, unspecified whether with hypoxia or hypercapnia: Secondary | ICD-10-CM | POA: Diagnosis present

## 2015-09-27 DIAGNOSIS — W19XXXA Unspecified fall, initial encounter: Secondary | ICD-10-CM

## 2015-09-27 DIAGNOSIS — S0990XA Unspecified injury of head, initial encounter: Secondary | ICD-10-CM | POA: Diagnosis not present

## 2015-09-27 DIAGNOSIS — I10 Essential (primary) hypertension: Secondary | ICD-10-CM | POA: Diagnosis not present

## 2015-09-27 DIAGNOSIS — I519 Heart disease, unspecified: Secondary | ICD-10-CM | POA: Diagnosis not present

## 2015-09-27 DIAGNOSIS — I4891 Unspecified atrial fibrillation: Secondary | ICD-10-CM | POA: Diagnosis not present

## 2015-09-27 DIAGNOSIS — Z9181 History of falling: Secondary | ICD-10-CM | POA: Diagnosis not present

## 2015-09-27 DIAGNOSIS — S79912A Unspecified injury of left hip, initial encounter: Secondary | ICD-10-CM | POA: Diagnosis not present

## 2015-09-27 DIAGNOSIS — E1169 Type 2 diabetes mellitus with other specified complication: Secondary | ICD-10-CM | POA: Diagnosis present

## 2015-09-27 DIAGNOSIS — R296 Repeated falls: Secondary | ICD-10-CM | POA: Diagnosis present

## 2015-09-27 DIAGNOSIS — S79911A Unspecified injury of right hip, initial encounter: Secondary | ICD-10-CM | POA: Diagnosis not present

## 2015-09-27 DIAGNOSIS — J9621 Acute and chronic respiratory failure with hypoxia: Secondary | ICD-10-CM | POA: Diagnosis present

## 2015-09-27 DIAGNOSIS — I2510 Atherosclerotic heart disease of native coronary artery without angina pectoris: Secondary | ICD-10-CM | POA: Diagnosis present

## 2015-09-27 LAB — CBC WITH DIFFERENTIAL/PLATELET
BASOS ABS: 0 10*3/uL (ref 0.0–0.1)
Basophils Relative: 0 %
Eosinophils Absolute: 0.2 10*3/uL (ref 0.0–0.7)
Eosinophils Relative: 1 %
HEMATOCRIT: 37.9 % (ref 36.0–46.0)
HEMOGLOBIN: 12.7 g/dL (ref 12.0–15.0)
LYMPHS ABS: 1.3 10*3/uL (ref 0.7–4.0)
LYMPHS PCT: 7 %
MCH: 30.3 pg (ref 26.0–34.0)
MCHC: 33.5 g/dL (ref 30.0–36.0)
MCV: 90.5 fL (ref 78.0–100.0)
MONOS PCT: 7 %
Monocytes Absolute: 1.3 10*3/uL — ABNORMAL HIGH (ref 0.1–1.0)
Neutro Abs: 15.7 10*3/uL — ABNORMAL HIGH (ref 1.7–7.7)
Neutrophils Relative %: 85 %
Platelets: 172 10*3/uL (ref 150–400)
RBC: 4.19 MIL/uL (ref 3.87–5.11)
RDW: 14.3 % (ref 11.5–15.5)
WBC: 18.5 10*3/uL — AB (ref 4.0–10.5)

## 2015-09-27 LAB — TROPONIN I: TROPONIN I: 0.04 ng/mL — AB (ref <0.031)

## 2015-09-27 LAB — I-STAT CG4 LACTIC ACID, ED: LACTIC ACID, VENOUS: 1.12 mmol/L (ref 0.5–2.0)

## 2015-09-27 LAB — COMPREHENSIVE METABOLIC PANEL
ALT: 20 U/L (ref 14–54)
ANION GAP: 7 (ref 5–15)
AST: 22 U/L (ref 15–41)
Albumin: 2.2 g/dL — ABNORMAL LOW (ref 3.5–5.0)
Alkaline Phosphatase: 80 U/L (ref 38–126)
BILIRUBIN TOTAL: 0.8 mg/dL (ref 0.3–1.2)
BUN: 48 mg/dL — ABNORMAL HIGH (ref 6–20)
CO2: 22 mmol/L (ref 22–32)
Calcium: 8.4 mg/dL — ABNORMAL LOW (ref 8.9–10.3)
Chloride: 104 mmol/L (ref 101–111)
Creatinine, Ser: 1.25 mg/dL — ABNORMAL HIGH (ref 0.44–1.00)
GFR, EST AFRICAN AMERICAN: 44 mL/min — AB (ref >60)
GFR, EST NON AFRICAN AMERICAN: 38 mL/min — AB (ref >60)
Glucose, Bld: 204 mg/dL — ABNORMAL HIGH (ref 65–99)
POTASSIUM: 4.1 mmol/L (ref 3.5–5.1)
Sodium: 133 mmol/L — ABNORMAL LOW (ref 135–145)
TOTAL PROTEIN: 6 g/dL — AB (ref 6.5–8.1)

## 2015-09-27 LAB — URINALYSIS, ROUTINE W REFLEX MICROSCOPIC
Bilirubin Urine: NEGATIVE
Glucose, UA: NEGATIVE mg/dL
Ketones, ur: NEGATIVE mg/dL
Nitrite: NEGATIVE
PROTEIN: 30 mg/dL — AB
Specific Gravity, Urine: 1.017 (ref 1.005–1.030)
pH: 5.5 (ref 5.0–8.0)

## 2015-09-27 LAB — URINE MICROSCOPIC-ADD ON

## 2015-09-27 LAB — BRAIN NATRIURETIC PEPTIDE: B Natriuretic Peptide: 376.7 pg/mL — ABNORMAL HIGH (ref 0.0–100.0)

## 2015-09-27 LAB — GLUCOSE, CAPILLARY: GLUCOSE-CAPILLARY: 278 mg/dL — AB (ref 65–99)

## 2015-09-27 LAB — I-STAT TROPONIN, ED: TROPONIN I, POC: 0.02 ng/mL (ref 0.00–0.08)

## 2015-09-27 LAB — CBG MONITORING, ED: Glucose-Capillary: 245 mg/dL — ABNORMAL HIGH (ref 65–99)

## 2015-09-27 MED ORDER — FERROUS SULFATE 325 (65 FE) MG PO TABS
325.0000 mg | ORAL_TABLET | Freq: Every day | ORAL | Status: DC
  Administered 2015-09-28 – 2015-10-05 (×8): 325 mg via ORAL
  Filled 2015-09-27 (×8): qty 1

## 2015-09-27 MED ORDER — ALPRAZOLAM 0.5 MG PO TABS
0.5000 mg | ORAL_TABLET | Freq: Four times a day (QID) | ORAL | Status: DC | PRN
  Administered 2015-09-27 – 2015-10-05 (×16): 0.5 mg via ORAL
  Filled 2015-09-27 (×16): qty 1

## 2015-09-27 MED ORDER — PANTOPRAZOLE SODIUM 40 MG PO TBEC
40.0000 mg | DELAYED_RELEASE_TABLET | Freq: Every day | ORAL | Status: DC
  Administered 2015-09-28 – 2015-10-05 (×8): 40 mg via ORAL
  Filled 2015-09-27 (×8): qty 1

## 2015-09-27 MED ORDER — DEXTROSE 5 % IV SOLN
1.0000 g | Freq: Once | INTRAVENOUS | Status: AC
  Administered 2015-09-27: 1 g via INTRAVENOUS
  Filled 2015-09-27: qty 10

## 2015-09-27 MED ORDER — INSULIN ASPART 100 UNIT/ML ~~LOC~~ SOLN
0.0000 [IU] | Freq: Every day | SUBCUTANEOUS | Status: DC
  Administered 2015-09-27: 3 [IU] via SUBCUTANEOUS

## 2015-09-27 MED ORDER — ASPIRIN EC 81 MG PO TBEC
81.0000 mg | DELAYED_RELEASE_TABLET | Freq: Every day | ORAL | Status: DC
  Administered 2015-09-28 – 2015-10-05 (×8): 81 mg via ORAL
  Filled 2015-09-27 (×8): qty 1

## 2015-09-27 MED ORDER — PRAVASTATIN SODIUM 20 MG PO TABS
20.0000 mg | ORAL_TABLET | Freq: Every day | ORAL | Status: DC
  Administered 2015-09-27 – 2015-10-04 (×8): 20 mg via ORAL
  Filled 2015-09-27 (×8): qty 1

## 2015-09-27 MED ORDER — HEPARIN BOLUS VIA INFUSION
3000.0000 [IU] | Freq: Once | INTRAVENOUS | Status: AC
  Administered 2015-09-27: 3000 [IU] via INTRAVENOUS
  Filled 2015-09-27: qty 3000

## 2015-09-27 MED ORDER — SODIUM CHLORIDE 0.9 % IV BOLUS (SEPSIS)
500.0000 mL | Freq: Once | INTRAVENOUS | Status: AC
  Administered 2015-09-27: 500 mL via INTRAVENOUS

## 2015-09-27 MED ORDER — HYDROCODONE-ACETAMINOPHEN 5-325 MG PO TABS
0.5000 | ORAL_TABLET | Freq: Four times a day (QID) | ORAL | Status: DC | PRN
  Administered 2015-09-27 – 2015-09-29 (×4): 1 via ORAL
  Filled 2015-09-27 (×4): qty 1

## 2015-09-27 MED ORDER — INSULIN ASPART 100 UNIT/ML ~~LOC~~ SOLN
0.0000 [IU] | Freq: Three times a day (TID) | SUBCUTANEOUS | Status: DC
  Administered 2015-09-28: 2 [IU] via SUBCUTANEOUS
  Administered 2015-09-28: 3 [IU] via SUBCUTANEOUS
  Administered 2015-09-28: 8 [IU] via SUBCUTANEOUS
  Administered 2015-09-29: 5 [IU] via SUBCUTANEOUS
  Administered 2015-09-29 – 2015-09-30 (×2): 3 [IU] via SUBCUTANEOUS
  Administered 2015-09-30: 2 [IU] via SUBCUTANEOUS
  Administered 2015-09-30: 5 [IU] via SUBCUTANEOUS
  Administered 2015-10-01: 8 [IU] via SUBCUTANEOUS
  Administered 2015-10-01 – 2015-10-02 (×2): 3 [IU] via SUBCUTANEOUS
  Administered 2015-10-02: 5 [IU] via SUBCUTANEOUS
  Administered 2015-10-03: 3 [IU] via SUBCUTANEOUS
  Administered 2015-10-03: 5 [IU] via SUBCUTANEOUS
  Administered 2015-10-04: 3 [IU] via SUBCUTANEOUS
  Administered 2015-10-04 (×2): 2 [IU] via SUBCUTANEOUS
  Administered 2015-10-05 (×2): 3 [IU] via SUBCUTANEOUS

## 2015-09-27 MED ORDER — HEPARIN (PORCINE) IN NACL 100-0.45 UNIT/ML-% IJ SOLN
1000.0000 [IU]/h | INTRAMUSCULAR | Status: DC
  Administered 2015-09-27: 850 [IU]/h via INTRAVENOUS
  Filled 2015-09-27 (×2): qty 250

## 2015-09-27 MED ORDER — FUROSEMIDE 10 MG/ML IJ SOLN
20.0000 mg | Freq: Two times a day (BID) | INTRAMUSCULAR | Status: DC
  Administered 2015-09-27 – 2015-09-29 (×4): 20 mg via INTRAVENOUS
  Filled 2015-09-27 (×4): qty 2

## 2015-09-27 MED ORDER — SODIUM CHLORIDE 0.9% FLUSH
3.0000 mL | Freq: Two times a day (BID) | INTRAVENOUS | Status: DC
  Administered 2015-09-27 – 2015-10-02 (×8): 3 mL via INTRAVENOUS

## 2015-09-27 MED ORDER — METOPROLOL TARTRATE 5 MG/5ML IV SOLN
2.5000 mg | Freq: Four times a day (QID) | INTRAVENOUS | Status: DC
  Administered 2015-09-28 (×2): 2.5 mg via INTRAVENOUS
  Filled 2015-09-27 (×2): qty 5

## 2015-09-27 MED ORDER — ACETAMINOPHEN 650 MG RE SUPP: 650.0000 mg | Freq: Four times a day (QID) | RECTAL | Status: DC | PRN

## 2015-09-27 MED ORDER — TRAZODONE HCL 150 MG PO TABS
150.0000 mg | ORAL_TABLET | Freq: Every day | ORAL | Status: DC
  Administered 2015-09-27 – 2015-10-04 (×8): 150 mg via ORAL
  Filled 2015-09-27 (×8): qty 1

## 2015-09-27 MED ORDER — NITROGLYCERIN 0.4 MG SL SUBL: 0.4000 mg | SUBLINGUAL_TABLET | SUBLINGUAL | Status: DC | PRN

## 2015-09-27 MED ORDER — ACETAMINOPHEN 325 MG PO TABS
650.0000 mg | ORAL_TABLET | Freq: Four times a day (QID) | ORAL | Status: DC | PRN
  Administered 2015-09-30 – 2015-10-05 (×5): 650 mg via ORAL
  Filled 2015-09-27 (×5): qty 2

## 2015-09-27 MED ORDER — FUROSEMIDE 10 MG/ML IJ SOLN
20.0000 mg | Freq: Once | INTRAMUSCULAR | Status: AC
  Administered 2015-09-27: 20 mg via INTRAVENOUS
  Filled 2015-09-27: qty 2

## 2015-09-27 MED ORDER — DILTIAZEM HCL 100 MG IV SOLR
5.0000 mg/h | INTRAVENOUS | Status: DC
  Administered 2015-09-27: 15 mg/h via INTRAVENOUS
  Administered 2015-09-27: 5 mg/h via INTRAVENOUS
  Filled 2015-09-27 (×2): qty 100

## 2015-09-27 NOTE — H&P (Signed)
History and Physical    [name] [id number] DOB: [birth year] DOA: 09/27/2015  PCP: Ria Bush, MD  Patient coming from: Home   Chief Complaint:   HPI: [name] is a 82 y.o. female with medical history significant of CAD, hypertension, chronic diastolic heart failure, PUD, depression, diabetes Mellitus, varicose veins, PAD, reports chills and fever since yesterday, she was brought to ED by family for worsening generalized weakness, altered mental status and multiple falls since yesterday.  She appears alert but slightly confused.  She also reports sob, dry cough, denies any chest pain, nausea . She reports one episode of vomiting. On arrival to ED, she was found to be in AFIB with RVR, she was started on IV cardizem and IV heparin. she underwent CT head without contrast and CT cervical spine which were unremarkable. CXR shows fluid overload.  Bilateral hips with pelvis does not reveal any fractures or dislocation. She was referred to medical service for evaluation of acute on chronic diastolic heart failure.   ED Course: she was started on IV cardizem gtt and IV lasix .   Review of Systems: As per HPI otherwise 10 point review of systems negative.    Past Medical History  Diagnosis Date  . Hypertension   . Coronary artery disease     CABG 1998  . Anxiety   . Diastolic dysfunction   . Hyperlipidemia   . PUD (peptic ulcer disease)   . Microcytic anemia   . Depression   . Diabetes mellitus     type 2, followed by endo.  . Diverticulosis   . Hemorrhoids   . History of heart attack   . Irritable bowel syndrome   . Chronic back pain 03/2012    lumbar DDD with herniation - s/p L S1 transforaminal ESI (10/2012 Dr. Sharlet Salina)  . Varicose veins   . Cystocele   . Hx of echocardiogram     a. Echo 10/13: mild LVH, EF 55-60%, Gr 1 diast dysfn, PASP 32  . Acute deep vein thrombosis (DVT) of distal vein of right lower extremity ([place]) 12/2013    Acute thrombus of R  proximal gastrocnemius vein. Symptomatic provoked distal DVT  . Cellulitis of leg, left 01/16/2014  . PAD (peripheral artery disease) ([place]) 2013    ABI: R 0.91, L 0.83  . Chronic venous insufficiency   . Ankle wound   . Viral gastroenteritis due to [place] virus 05/13/2015    Past Surgical History  Procedure Laterality Date  . Tympanoplasty  1960s    R side  . Coronary artery bypass graft  1998  . Cataract surgery    . Cholecystectomy    . Appendectomy      per pt report  . Colonoscopy  02/2010    extensive diverticulosis throughout, small internal hemorrhoids, rec rpt 5 yrs (Dr. Allyn Kenner)  . Hand surgery    . Lexiscan myoview  03/2011    Small basal inferolateral and anterolateral reversible perfusion defect suggests ischemia.  EF was normal.   old infarct, no new ischemia  . Toe surgery    . Abdominal hysterectomy  1975    TAH,BSO  . Vault prolapse a & p repair  2009    Dr [facility] hospital  . Dexa scan  09/2011    femur -0.8, forearm 0.6 => normal  . Esi  2014    L S1 transforaminal ESI x2 ([place]) without improvement  . Abi  2013    R 0.91, L 0.83  .  Endovenous ablation saphenous vein w/ laser Left 03/2014    Kellie Simmering  . Cardiovascular stress test  2015    low risk myoview ([place])     reports that she quit smoking about 36 years ago. She has never used smokeless tobacco. She reports that she does not drink alcohol or use illicit drugs.  Allergies  Allergen Reactions  . Amoxicillin-Pot Clavulanate Other (See Comments)    Reaction:  Unknown   . Metformin And Related Diarrhea    Family History  Problem Relation Age of Onset  . Hypertension Mother   . Stroke Mother   . Diabetes Father   . Diabetes Sister   . Diabetes Brother   . Colon cancer Neg Hx      Prior to Admission medications   Medication Sig Start Date End Date Taking? Authorizing Provider  acetaminophen (TYLENOL) 500 MG tablet Take 500 mg by mouth every 6 (six) hours as needed for mild pain.    Yes Historical Provider, MD  acidophilus (RISAQUAD) CAPS capsule Take 1 capsule by mouth daily.   Yes Historical Provider, MD  ALPRAZolam Duanne Moron) 1 MG tablet Take 0.5 mg by mouth 4 (four) times daily as needed for anxiety.    Yes Historical Provider, MD  aspirin EC 81 MG tablet Take 81 mg by mouth daily.   Yes Historical Provider, MD  Dexlansoprazole (DEXILANT) 30 MG capsule Take 1 capsule (30 mg total) by mouth daily. 05/12/15  Yes Ria Bush, MD  ferrous sulfate 325 (65 FE) MG tablet Take 1 tablet (325 mg total) by mouth daily with breakfast. 09/06/14  Yes Ria Bush, MD  furosemide (LASIX) 20 MG tablet Take 20 mg by mouth daily.   Yes Historical Provider, MD  glucosamine-chondroitin 500-400 MG tablet Take 1 tablet by mouth daily.    Yes Historical Provider, MD  HYDROcodone-acetaminophen (NORCO/VICODIN) 5-325 MG tablet Take 0.5-1 tablets by mouth every 6 (six) hours as needed for moderate pain. 09/08/15  Yes Ria Bush, MD  loperamide (IMODIUM) 2 MG capsule Take 2 mg by mouth 4 (four) times daily as needed for diarrhea or loose stools.   Yes Historical Provider, MD  nystatin-triamcinolone ointment (MYCOLOG) Apply 1 application topically 2 (two) times daily as needed (for itching).   Yes Historical Provider, MD  pioglitazone (ACTOS) 30 MG tablet Take 30 mg by mouth every evening.    Yes Historical Provider, MD  pravastatin (PRAVACHOL) 20 MG tablet Take 20 mg by mouth at bedtime.   Yes Historical Provider, MD  ramipril (ALTACE) 5 MG capsule Take 5 mg by mouth every evening.    Yes Historical Provider, MD  traZODone (DESYREL) 150 MG tablet Take 150 mg by mouth at bedtime.    Yes Historical Provider, MD  triamcinolone cream (KENALOG) 0.1 % APPLY 1 APPLICATION TOPICALLY 2 (TWO) TIMES DAILY. 04/06/15  Yes Historical Provider, MD  vitamin B-12 (CYANOCOBALAMIN) 1000 MCG tablet Take 1,000 mcg by mouth daily.   Yes Historical Provider, MD  vitamin E 400 UNIT capsule Take 400 Units by mouth daily.    Yes Historical Provider, MD  nitroGLYCERIN (NITROSTAT) 0.4 MG SL tablet Place 0.4 mg under the tongue every 5 (five) minutes as needed for chest pain.     Thayer Headings, MD    Physical Exam: Filed Vitals:   09/27/15 1900 09/27/15 1915 09/27/15 1930 09/27/15 2005  BP: 118/68 114/58 112/60 126/57  Pulse: 121 77 42 97  Temp:    99.4 F (37.4 C)  TempSrc:  Oral  Resp: 24 29 30 21   SpO2: 98% 97% 97% 95%      Constitutional: NAD, calm, comfortable Filed Vitals:   09/27/15 1900 09/27/15 1915 09/27/15 1930 09/27/15 2005  BP: 118/68 114/58 112/60 126/57  Pulse: 121 77 42 97  Temp:    99.4 F (37.4 C)  TempSrc:    Oral  Resp: 24 29 30 21   SpO2: 98% 97% 97% 95%   Eyes: PERRL, lids and conjunctivae normal ENMT: Mucous membranes are moist.. Neck: normal, supple, no masses, no thyromegaly Respiratory: no wheezing, diminished at bases. No rhonchi.  Cardiovascular: irregular, tachycardic, s1s2,, no murmers or JVD. Marland Kitchen  Abdomen: no tenderness, no masses palpated. No hepatosplenomegaly. Bowel sounds positive.  Musculoskeletal: Bilateral lower extremity varicose veins.  Skin: no rashes, lesions, ulcers. No induration Neurologic: CN 2-12 grossly intact. Sensation intact, no focal deficits.  Psychiatric: Normal judgment and insight.     Labs on Admission: I have personally reviewed following labs and imaging studies  CBC:  Recent Labs Lab 09/27/15 1434  WBC 18.5*  NEUTROABS 15.7*  HGB 12.7  HCT 37.9  MCV 90.5  PLT [id number]   Basic Metabolic Panel:  Recent Labs Lab 09/27/15 1434  NA 133*  K 4.1  CL 104  CO2 22  GLUCOSE 204*  BUN 48*  CREATININE 1.25*  CALCIUM 8.4*   GFR: Estimated Creatinine Clearance: 26.8 mL/min (by C-G formula based on Cr of 1.25). Liver Function Tests:  Recent Labs Lab 09/27/15 1434  AST 22  ALT 20  ALKPHOS 80  BILITOT 0.8  PROT 6.0*  ALBUMIN 2.2*   No results for input(s): LIPASE, AMYLASE in the last 168 hours. No results for  input(s): AMMONIA in the last 168 hours. Coagulation Profile: No results for input(s): INR, PROTIME in the last 168 hours. Cardiac Enzymes: No results for input(s): CKTOTAL, CKMB, CKMBINDEX, TROPONINI in the last 168 hours. BNP (last 3 results) No results for input(s): PROBNP in the last 8760 hours. HbA1C: No results for input(s): HGBA1C in the last 72 hours. CBG:  Recent Labs Lab 09/27/15 1319  GLUCAP 245*   Lipid Profile: No results for input(s): CHOL, HDL, LDLCALC, TRIG, CHOLHDL, LDLDIRECT in the last 72 hours. Thyroid Function Tests: No results for input(s): TSH, T4TOTAL, [name]4, T3FREE, THYROIDAB in the last 72 hours. Anemia Panel: No results for input(s): VITAMINB12, FOLATE, FERRITIN, TIBC, IRON, RETICCTPCT in the last 72 hours. Urine analysis:    Component Value Date/Time   COLORURINE YELLOW 09/27/2015 1520   APPEARANCEUR TURBID* 09/27/2015 1520   LABSPEC 1.017 09/27/2015 1520   PHURINE 5.5 09/27/2015 1520   GLUCOSEU NEGATIVE 09/27/2015 1520   GLUCOSEU NEGATIVE 10/31/2014 1534   HGBUR TRACE* 09/27/2015 1520   BILIRUBINUR NEGATIVE 09/27/2015 1520   BILIRUBINUR Negative 05/07/2012 1411   KETONESUR NEGATIVE 09/27/2015 1520   PROTEINUR 30* 09/27/2015 1520   PROTEINUR Negative 05/07/2012 1411   UROBILINOGEN 0.2 10/31/2014 1534   UROBILINOGEN 0.2 05/07/2012 1411   NITRITE NEGATIVE 09/27/2015 1520   NITRITE Negative 05/07/2012 1411   LEUKOCYTESUR LARGE* 09/27/2015 1520   Sepsis Labs: !!!!!!!!!!!!!!!!!!!!!!!!!!!!!!!!!!!!!!!!!!!! @LABRCNTIP (procalcitonin:4,lacticidven:4) )No results found for this or any previous visit (from the past 240 hour(s)).   Radiological Exams on Admission: Dg Chest 1 View  09/27/2015  CLINICAL DATA:  Mental status changes, bilateral hip pain after several recent falls. EXAM: CHEST 1 VIEW COMPARISON:  Chest x-ray dated 01/14/2009. FINDINGS: Median sternotomy wires appear intact and stable in alignment, with surgical changes of associated CABG.  Heart is enlarged,  possibly accentuated to some degree by the lordotic/supine patient positioning. Atherosclerotic calcifications noted at the aortic arch. Mild interstitial prominence, also possibly accentuated by the supine patient positioning. No confluent opacity to suggest a developing pneumonia. No pleural effusion or pneumothorax seen. Left lung base obscured by the enlarged heart. IMPRESSION: 1. Cardiomegaly. Mild interstitial prominence is probably accentuated by the supine patient positioning, possibly indicating a mild CHF/volume overload. No overt alveolar pulmonary edema. No evidence of pneumonia. 2. Aortic atherosclerosis. Electronically Signed   By: Franki Cabot M.D.   On: 09/27/2015 15:21   Ct Head Wo Contrast  09/27/2015  CLINICAL DATA:  Altered mental status and fall. Lethargy and slurred speech. EXAM: CT HEAD WITHOUT CONTRAST CT CERVICAL SPINE WITHOUT CONTRAST TECHNIQUE: Multidetector CT imaging of the head and cervical spine was performed following the standard protocol without intravenous contrast. Multiplanar CT image reconstructions of the cervical spine were also generated. COMPARISON:  Brain MRI dated 05/13/2010. FINDINGS: CT HEAD FINDINGS Again noted is generalized age related brain atrophy with commensurate dilatation of the ventricles and sulci. Mild chronic small vessel ischemic changes again noted within the bilateral periventricular and subcortical white matter regions. 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 There is no mass, hemorrhage, edema or other evidence of acute parenchymal abnormality. No extra-axial hemorrhage. No osseous abnormality. Mild mucosal thickening noted in the left frontal sinus, of uncertain age but likely chronic. Visualized paranasal sinuses otherwise clear. CT CERVICAL SPINE FINDINGS Degenerative changes throughout the cervical spine, moderate in degree with associated disc space narrowings and  osseous spurring throughout. Combinations of disc-osteophytic bulges, uncovertebral joint hypertrophy and facet joint hypertrophy are causing mild to moderate central canal stenoses throughout with possible associated nerve root impingement. Slight retrolisthesis of C4 is likely related to the underlying degenerative changes. There is no fracture line or displaced fracture fragment identified. Facet joints are appropriately aligned throughout. Atherosclerotic calcifications noted at each carotid bulb region. Paravertebral soft tissues otherwise unremarkable. IMPRESSION: 1. No acute intracranial abnormality. No intracranial mass, hemorrhage or edema. Atrophy and chronic ischemic changes, as detailed above. 2. No fracture or acute subluxation within the cervical spine. Chronic/degenerative findings detailed above. Electronically Signed   By: Franki Cabot M.D.   On: 09/27/2015 16:08   Ct Cervical Spine Wo Contrast  09/27/2015  CLINICAL DATA:  Altered mental status and fall. Lethargy and slurred speech. EXAM: CT HEAD WITHOUT CONTRAST CT CERVICAL SPINE WITHOUT CONTRAST TECHNIQUE: Multidetector CT imaging of the head and cervical spine was performed following the standard protocol without intravenous contrast. Multiplanar CT image reconstructions of the cervical spine were also generated. COMPARISON:  Brain MRI dated 05/13/2010. FINDINGS: CT HEAD FINDINGS Again noted is generalized age related brain atrophy with commensurate dilatation of the ventricles and sulci. Mild chronic small vessel ischemic changes again noted within the bilateral periventricular and subcortical white matter regions. 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 There is no mass, hemorrhage, edema or other evidence of acute parenchymal abnormality. No extra-axial hemorrhage. No osseous abnormality. Mild mucosal thickening noted in the left frontal sinus, of uncertain age but likely chronic.  Visualized paranasal sinuses otherwise clear. CT CERVICAL SPINE FINDINGS Degenerative changes throughout the cervical spine, moderate in degree with associated disc space narrowings and osseous spurring throughout. Combinations of disc-osteophytic bulges, uncovertebral joint hypertrophy and facet joint hypertrophy are causing mild to moderate central canal stenoses throughout with possible associated nerve root impingement. Slight retrolisthesis  of C4 is likely related to the underlying degenerative changes. There is no fracture line or displaced fracture fragment identified. Facet joints are appropriately aligned throughout. Atherosclerotic calcifications noted at each carotid bulb region. Paravertebral soft tissues otherwise unremarkable. IMPRESSION: 1. No acute intracranial abnormality. No intracranial mass, hemorrhage or edema. Atrophy and chronic ischemic changes, as detailed above. 2. No fracture or acute subluxation within the cervical spine. Chronic/degenerative findings detailed above. Electronically Signed   By: Franki Cabot M.D.   On: 09/27/2015 16:08   Dg Hips Bilat With Pelvis 3-4 Views  09/27/2015  CLINICAL DATA:  Mental status changes, recent falls with bilateral hip pain. EXAM: DG HIP (WITH OR WITHOUT PELVIS) 3-4V BILAT COMPARISON:  None. FINDINGS: Single view of the pelvis and two views of each hip are provided. Osseous alignment is normal. There is osteopenia which limits characterization of osseous detail, however, there is no fracture line or displaced fracture fragment identified. Mild degenerative change noted at each hip and within the scoliotic lower lumbar spine. Additional degenerative changes/scleroses seen at the sacroiliac joint spaces. Calcified atherosclerotic changes noted within soft tissues about the pelvis and proximal femurs. Soft tissues about the pelvis and hips are otherwise unremarkable. IMPRESSION: No acute findings. No osseous fracture or dislocation. Osteopenia. Mild  degenerative change. Atherosclerotic calcifications. Electronically Signed   By: Franki Cabot M.D.   On: 09/27/2015 15:18    EKG: afib at 119/min.   Assessment/Plan Active Problems:   Coronary artery disease   Anxiety   Hypertension   Diastolic dysfunction   Hyperlipidemia   Peptic ulcer disease   Dysphagia   Diabetic polyneuropathy (HCC)   Venous ulcer of leg (HCC)   Diabetes mellitus type 2 with peripheral artery disease (HCC)   Chronic venous insufficiency   Malnutrition of moderate degree   Acute respiratory failure (HCC)   Acute on chronic diastolic heart failure (HCC)  Acute respiratory Failure with Hypoxia probably secondary to acute on chronic diastolic heart failure.  Started on IV lasix 20 mg BID Strict intake and output, daily weights.  Echocardiogram ordered.  Serial troponins.  Will request cardiology consult in am.    Atrial fibrillation with RVR: New onset.  Started on IV cardizem gtt and IV heparin by EDP.  Will add bb .  Echo, cardiac enzymes.   Urinary tract infection: Urine cultures ordered and pending.  Blood cultures not done on admission.   PUD: resume protonix.    CAD: No chest pain.  Lipid panel in am.  Resume aspirin.   Hyperlipidemia: Lipid panel in am.  Resume pravachol.    Varicose veins/ PAD: ABI ordered.  Call vascular in am.   Acute encephalopathy: - probably from the infection.    Diabetes Mellitus: CBG (last 3)   Recent Labs  09/27/15 1319  GLUCAP 245*   Started on SSI. Holding oral  Home medications.  Hypertension:well controlled.  Holding home meds for borderline bp.    Anxiety: Resume xanax.        DVT prophylaxis: IV heparin.  Code Status: FULL CODE.  Family Communication: grand daughter and husband bedside.  Disposition Plan: pending further management.  Consults called: none Admission status:  Inpatient and SDU     [name] Miyazaki MD [facility] Pager [phone]  If  7PM-7AM, please contact night-coverage [email or web address] Password [facility]  09/27/2015, 8:15 PM

## 2015-09-27 NOTE — ED Provider Notes (Signed)
CSN: [id number]     Arrival date & time 09/27/15  1316 History   First MD Initiated Contact with Patient 09/27/15 1323     Chief Complaint  Patient presents with  . Altered Mental Status     Patient is a 82 y.o. female presenting with altered mental status. The history is provided by the patient and a relative. No language interpreter was used.  Altered Mental Status  [name] is a 82 y.o. female who presents to the Emergency Department complaining of AMS.  Level V caveat due to confusion.  History is provided by the patient and her son. She reports feeling poorly over the last week with generalized weakness, multiple falls, pain in bilateral thighs. Per family she's been confused for the last week. They report that she has had 2 falls and in the last week and endorsed increased pain and weakness in bilateral lower extremities. Patient endorses shortness of breath, cough, feeling hot and cold. She denies any chest pain. She has chronic abdominal pain, unchanged from priors. She denies any numbness.  Past Medical History  Diagnosis Date  . Hypertension   . Coronary artery disease     CABG 1998  . Anxiety   . Diastolic dysfunction   . Hyperlipidemia   . PUD (peptic ulcer disease)   . Microcytic anemia   . Depression   . Diabetes mellitus     type 2, followed by endo.  . Diverticulosis   . Hemorrhoids   . History of heart attack   . Irritable bowel syndrome   . Chronic back pain 03/2012    lumbar DDD with herniation - s/p L S1 transforaminal ESI (10/2012 Dr. Sharlet Salina)  . Varicose veins   . Cystocele   . Hx of echocardiogram     a. Echo 10/13: mild LVH, EF 55-60%, Gr 1 diast dysfn, PASP 32  . Acute deep vein thrombosis (DVT) of distal vein of right lower extremity ([place]) 12/2013    Acute thrombus of R proximal gastrocnemius vein. Symptomatic provoked distal DVT  . Cellulitis of leg, left 01/16/2014  . PAD (peripheral artery disease) ([place]) 2013    ABI: R 0.91, L 0.83  .  Chronic venous insufficiency   . Ankle wound   . Viral gastroenteritis due to [place] virus 05/13/2015   Past Surgical History  Procedure Laterality Date  . Tympanoplasty  1960s    R side  . Coronary artery bypass graft  1998  . Cataract surgery    . Cholecystectomy    . Appendectomy      per pt report  . Colonoscopy  02/2010    extensive diverticulosis throughout, small internal hemorrhoids, rec rpt 5 yrs (Dr. Allyn Kenner)  . Hand surgery    . Lexiscan myoview  03/2011    Small basal inferolateral and anterolateral reversible perfusion defect suggests ischemia.  EF was normal.   old infarct, no new ischemia  . Toe surgery    . Abdominal hysterectomy  1975    TAH,BSO  . Vault prolapse a & p repair  2009    Dr [facility] hospital  . Dexa scan  09/2011    femur -0.8, forearm 0.6 => normal  . Esi  2014    L S1 transforaminal ESI x2 ([place]) without improvement  . Abi  2013    R 0.91, L 0.83  . Endovenous ablation saphenous vein w/ laser Left 03/2014    Kellie Simmering  . Cardiovascular stress test  2015  low risk myoview Clinical research associate)   Family History  Problem Relation Age of Onset  . Hypertension Mother   . Stroke Mother   . Diabetes Father   . Diabetes Sister   . Diabetes Brother   . Colon cancer Neg Hx    Social History  Substance Use Topics  . Smoking status: Former Smoker    Quit date: 03/05/1979  . Smokeless tobacco: Never Used  . Alcohol Use: No   OB History    Gravida Para Term Preterm AB TAB SAB Ectopic Multiple Living   4 4 2       2      Review of Systems  All other systems reviewed and are negative.     Allergies  Amoxicillin-pot clavulanate and Metformin and related  Home Medications   Prior to Admission medications   Medication Sig Start Date End Date Taking? Authorizing Provider  acetaminophen (TYLENOL) 500 MG tablet Take 500 mg by mouth every 6 (six) hours as needed for mild pain.   Yes Historical Provider, MD  acidophilus (RISAQUAD) CAPS capsule Take  1 capsule by mouth daily.   Yes Historical Provider, MD  ALPRAZolam Duanne Moron) 1 MG tablet Take 0.5 mg by mouth 4 (four) times daily as needed for anxiety.    Yes Historical Provider, MD  aspirin EC 81 MG tablet Take 81 mg by mouth daily.   Yes Historical Provider, MD  Dexlansoprazole (DEXILANT) 30 MG capsule Take 1 capsule (30 mg total) by mouth daily. 05/12/15  Yes Ria Bush, MD  ferrous sulfate 325 (65 FE) MG tablet Take 1 tablet (325 mg total) by mouth daily with breakfast. 09/06/14  Yes Ria Bush, MD  furosemide (LASIX) 20 MG tablet Take 20 mg by mouth daily.   Yes Historical Provider, MD  glucosamine-chondroitin 500-400 MG tablet Take 1 tablet by mouth daily.    Yes Historical Provider, MD  HYDROcodone-acetaminophen (NORCO/VICODIN) 5-325 MG tablet Take 0.5-1 tablets by mouth every 6 (six) hours as needed for moderate pain. 09/08/15  Yes Ria Bush, MD  loperamide (IMODIUM) 2 MG capsule Take 2 mg by mouth 4 (four) times daily as needed for diarrhea or loose stools.   Yes Historical Provider, MD  nystatin-triamcinolone ointment (MYCOLOG) Apply 1 application topically 2 (two) times daily as needed (for itching).   Yes Historical Provider, MD  pioglitazone (ACTOS) 30 MG tablet Take 30 mg by mouth every evening.    Yes Historical Provider, MD  pravastatin (PRAVACHOL) 20 MG tablet Take 20 mg by mouth at bedtime.   Yes Historical Provider, MD  ramipril (ALTACE) 5 MG capsule Take 5 mg by mouth every evening.    Yes Historical Provider, MD  traZODone (DESYREL) 150 MG tablet Take 150 mg by mouth at bedtime.    Yes Historical Provider, MD  triamcinolone cream (KENALOG) 0.1 % APPLY 1 APPLICATION TOPICALLY 2 (TWO) TIMES DAILY. 04/06/15  Yes Historical Provider, MD  vitamin B-12 (CYANOCOBALAMIN) 1000 MCG tablet Take 1,000 mcg by mouth daily.   Yes Historical Provider, MD  vitamin E 400 UNIT capsule Take 400 Units by mouth daily.   Yes Historical Provider, MD  nitroGLYCERIN (NITROSTAT) 0.4 MG SL  tablet Place 0.4 mg under the tongue every 5 (five) minutes as needed for chest pain.     Thayer Headings, MD   BP 100/69 mmHg  Pulse 85  Temp(Src) 99.7 F (37.6 C) (Rectal)  Resp 18  SpO2 99% Physical Exam  Constitutional: She appears well-developed. She appears distressed.  HENT:  Head: Normocephalic and atraumatic.  Cardiovascular:  No murmur heard. Tachycardic and irregular  Pulmonary/Chest: Effort normal. No respiratory distress.  Bibasilar rales.  Abdominal: Soft. There is no rebound and no guarding.  Mild diffuse abdominal tenderness  Musculoskeletal:  1+ femoral pulses bilaterally. Dopplerable right lower extremity DP pulse and PT pulse. Unable to Doppler pulses in the left DP or PT. Bilateral legs are warm and perfused on exam with normal cap refill. There is mild tenderness to palpation throughout bilateral hips without any deformity.  Neurological:  Drowsy, mildly confused.  3/5 strength in BLE, 4/5 strength in BUE  Skin: Skin is warm and dry.  Psychiatric: She has a normal mood and affect. Her behavior is normal.  Nursing note and vitals reviewed.   ED Course  Procedures (including critical care time) CRITICAL CARE Performed by: Quintella Reichert   Total critical care time: 30 minutes  Critical care time was exclusive of separately billable procedures and treating other patients.  Critical care was necessary to treat or prevent imminent or life-threatening deterioration.  Critical care was time spent personally by me on the following activities: development of treatment plan with patient and/or surrogate as well as nursing, discussions with consultants, evaluation of patient's response to treatment, examination of patient, obtaining history from patient or surrogate, ordering and performing treatments and interventions, ordering and review of laboratory studies, ordering and review of radiographic studies, pulse oximetry and re-evaluation of patient's  condition.  Labs Review Labs Reviewed  COMPREHENSIVE METABOLIC PANEL - Abnormal; Notable for the following:    Sodium 133 (*)    Glucose, Bld 204 (*)    BUN 48 (*)    Creatinine, Ser 1.25 (*)    Calcium 8.4 (*)    Total Protein 6.0 (*)    Albumin 2.2 (*)    GFR calc non Af Amer 38 (*)    GFR calc Af Amer 44 (*)    All other components within normal limits  URINALYSIS, ROUTINE W REFLEX MICROSCOPIC (NOT AT [facility]) - Abnormal; Notable for the following:    APPearance TURBID (*)    Hgb urine dipstick TRACE (*)    Protein, ur 30 (*)    Leukocytes, UA LARGE (*)    All other components within normal limits  CBC WITH DIFFERENTIAL/PLATELET - Abnormal; Notable for the following:    WBC 18.5 (*)    Neutro Abs 15.7 (*)    Monocytes Absolute 1.3 (*)    All other components within normal limits  URINE MICROSCOPIC-ADD ON - Abnormal; Notable for the following:    Squamous Epithelial / LPF TOO NUMEROUS TO COUNT (*)    Bacteria, UA MANY (*)    All other components within normal limits  BRAIN NATRIURETIC PEPTIDE - Abnormal; Notable for the following:    B Natriuretic Peptide 376.7 (*)    All other components within normal limits  CBG MONITORING, ED - Abnormal; Notable for the following:    Glucose-Capillary 245 (*)    All other components within normal limits  URINE CULTURE  HEPARIN LEVEL (UNFRACTIONATED)  CBC  I-STAT TROPOININ, ED  I-STAT CG4 LACTIC ACID, ED    Imaging Review Dg Chest 1 View  09/27/2015  CLINICAL DATA:  Mental status changes, bilateral hip pain after several recent falls. EXAM: CHEST 1 VIEW COMPARISON:  Chest x-ray dated 01/14/2009. FINDINGS: Median sternotomy wires appear intact and stable in alignment, with surgical changes of associated CABG. Heart is enlarged, possibly accentuated to some degree by the lordotic/supine patient  positioning. Atherosclerotic calcifications noted at the aortic arch. Mild interstitial prominence, also possibly accentuated by the supine patient  positioning. No confluent opacity to suggest a developing pneumonia. No pleural effusion or pneumothorax seen. Left lung base obscured by the enlarged heart. IMPRESSION: 1. Cardiomegaly. Mild interstitial prominence is probably accentuated by the supine patient positioning, possibly indicating a mild CHF/volume overload. No overt alveolar pulmonary edema. No evidence of pneumonia. 2. Aortic atherosclerosis. Electronically Signed   By: Franki Cabot M.D.   On: 09/27/2015 15:21   Ct Head Wo Contrast  09/27/2015  CLINICAL DATA:  Altered mental status and fall. Lethargy and slurred speech. EXAM: CT HEAD WITHOUT CONTRAST CT CERVICAL SPINE WITHOUT CONTRAST TECHNIQUE: Multidetector CT imaging of the head and cervical spine was performed following the standard protocol without intravenous contrast. Multiplanar CT image reconstructions of the cervical spine were also generated. COMPARISON:  Brain MRI dated 05/13/2010. FINDINGS: CT HEAD FINDINGS Again noted is generalized age related brain atrophy with commensurate dilatation of the ventricles and sulci. Mild chronic small vessel ischemic changes again noted within the bilateral periventricular and subcortical white matter regions. 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 There is no mass, hemorrhage, edema or other evidence of acute parenchymal abnormality. No extra-axial hemorrhage. No osseous abnormality. Mild mucosal thickening noted in the left frontal sinus, of uncertain age but likely chronic. Visualized paranasal sinuses otherwise clear. CT CERVICAL SPINE FINDINGS Degenerative changes throughout the cervical spine, moderate in degree with associated disc space narrowings and osseous spurring throughout. Combinations of disc-osteophytic bulges, uncovertebral joint hypertrophy and facet joint hypertrophy are causing mild to moderate central canal stenoses throughout with possible associated nerve root  impingement. Slight retrolisthesis of C4 is likely related to the underlying degenerative changes. There is no fracture line or displaced fracture fragment identified. Facet joints are appropriately aligned throughout. Atherosclerotic calcifications noted at each carotid bulb region. Paravertebral soft tissues otherwise unremarkable. IMPRESSION: 1. No acute intracranial abnormality. No intracranial mass, hemorrhage or edema. Atrophy and chronic ischemic changes, as detailed above. 2. No fracture or acute subluxation within the cervical spine. Chronic/degenerative findings detailed above. Electronically Signed   By: Franki Cabot M.D.   On: 09/27/2015 16:08   Ct Cervical Spine Wo Contrast  09/27/2015  CLINICAL DATA:  Altered mental status and fall. Lethargy and slurred speech. EXAM: CT HEAD WITHOUT CONTRAST CT CERVICAL SPINE WITHOUT CONTRAST TECHNIQUE: Multidetector CT imaging of the head and cervical spine was performed following the standard protocol without intravenous contrast. Multiplanar CT image reconstructions of the cervical spine were also generated. COMPARISON:  Brain MRI dated 05/13/2010. FINDINGS: CT HEAD FINDINGS Again noted is generalized age related brain atrophy with commensurate dilatation of the ventricles and sulci. Mild chronic small vessel ischemic changes again noted within the bilateral periventricular and subcortical white matter regions. 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 There is no mass, hemorrhage, edema or other evidence of acute parenchymal abnormality. No extra-axial hemorrhage. No osseous abnormality. Mild mucosal thickening noted in the left frontal sinus, of uncertain age but likely chronic. Visualized paranasal sinuses otherwise clear. CT CERVICAL SPINE FINDINGS Degenerative changes throughout the cervical spine, moderate in degree with associated disc space narrowings and osseous spurring throughout. Combinations  of disc-osteophytic bulges, uncovertebral joint hypertrophy and facet joint hypertrophy are causing mild to moderate central canal stenoses throughout with possible associated nerve root impingement. Slight retrolisthesis of C4 is likely related to the underlying  degenerative changes. There is no fracture line or displaced fracture fragment identified. Facet joints are appropriately aligned throughout. Atherosclerotic calcifications noted at each carotid bulb region. Paravertebral soft tissues otherwise unremarkable. IMPRESSION: 1. No acute intracranial abnormality. No intracranial mass, hemorrhage or edema. Atrophy and chronic ischemic changes, as detailed above. 2. No fracture or acute subluxation within the cervical spine. Chronic/degenerative findings detailed above. Electronically Signed   By: Franki Cabot M.D.   On: 09/27/2015 16:08   Dg Hips Bilat With Pelvis 3-4 Views  09/27/2015  CLINICAL DATA:  Mental status changes, recent falls with bilateral hip pain. EXAM: DG HIP (WITH OR WITHOUT PELVIS) 3-4V BILAT COMPARISON:  None. FINDINGS: Single view of the pelvis and two views of each hip are provided. Osseous alignment is normal. There is osteopenia which limits characterization of osseous detail, however, there is no fracture line or displaced fracture fragment identified. Mild degenerative change noted at each hip and within the scoliotic lower lumbar spine. Additional degenerative changes/scleroses seen at the sacroiliac joint spaces. Calcified atherosclerotic changes noted within soft tissues about the pelvis and proximal femurs. Soft tissues about the pelvis and hips are otherwise unremarkable. IMPRESSION: No acute findings. No osseous fracture or dislocation. Osteopenia. Mild degenerative change. Atherosclerotic calcifications. Electronically Signed   By: Franki Cabot M.D.   On: 09/27/2015 15:18   I have personally reviewed and evaluated these images and lab results as part of my medical  decision-making.   EKG Interpretation   Date/Time:  Sunday September 27 2015 14:19:04 EDT Ventricular Rate:  119 PR Interval:    QRS Duration: 88 QT Interval:  328 QTC Calculation: 462 R Axis:   20 Text Interpretation:  Atrial fibrillation Anteroseptal infarct, age  indeterminate Confirmed by Hazle Coca [phone]) on 09/27/2015 3:19:11 PM      MDM   Final diagnoses:  Fall    Patient here for evaluation of multiple complaints but her main complaint is bilateral leg pain that has been ongoing for the last 2 weeks. She is ill appearing on examination with some bibasilar crackles. She is in atrial fibrillation with rapid ventricular response which is new she has decreased pulses in bilateral lower extremities but notes of peripheral vascular disease in the records. She was started on diltiazem for the A. fib with RVR with improvement in her rate she was also given a small fluid bolus due to concern for dehydration. She was given Rocephin for a urinary tract infection. She was started on heparin for new onset atrial fibrillation with concern for potential vascular occlusion in the legs. Discussed with Dr. Bridgett Larsson with vascular surgery regarding leg exam - given the duration of her systems and known history of peripheral vascular disease recommends checking ABIs.  Hospitalist consultation for admission for the atrial fibrillation with RVR, UTI. Following her fluid bolus administration she didn't develop increased shortness of breath with increased crackles, will provide dose of Lasix.Quintella Reichert, MD 09/27/15 [phone]

## 2015-09-27 NOTE — ED Notes (Signed)
Attempted report 

## 2015-09-27 NOTE — ED Notes (Signed)
Pt having increased wheezing. MD aware and at bedside.

## 2015-09-27 NOTE — Progress Notes (Signed)
[place] for heparin Indication: VTE treatment  Allergies  Allergen Reactions  . Amoxicillin-Pot Clavulanate Other (See Comments)    Reaction:  Unknown   . Metformin And Related Diarrhea    Patient Measurements:  Heparin Dosing Weight: 70kg  Vital Signs: Temp: 99.7 F (37.6 C) (06/25 1339) Temp Source: Rectal (06/25 1339) BP: 111/96 mmHg (06/25 1600) Pulse Rate: 118 (06/25 1600)  Labs:  Recent Labs  09/27/15 1434  HGB 12.7  HCT 37.9  PLT 172  CREATININE 1.25*    Estimated Creatinine Clearance: 26.8 mL/min (by C-G formula based on Cr of 1.25).   Assessment: 82 year old female admitted with slurred speech, s/p fall last week now with bilateral leg pain. New orders to start IV heparin. Patient does not appear to be on anticoagulants pta, cbc wnl.  Goal of Therapy:  Heparin level 0.3-0.7 units/ml Monitor platelets by anticoagulation protocol: Yes   Plan:  Give 3000 units bolus x 1 Start heparin infusion at 850 units/hr Check anti-Xa level in 8 hours and daily while on heparin Continue to monitor H&H and platelets  Erin Hearing PharmD., BCPS Clinical Pharmacist Pager [phone] 09/27/2015 4:45 PM

## 2015-09-27 NOTE — ED Notes (Signed)
Pt to ER BIB [facility] from home where family called out for altered mental status LSN yesterday at 1300. Pt family reports patient to be lethargic, altered, and slurring speech. Pt is alert/oriented x4. Pt appears short of breath, wheezing noted to lower lobes spO2 90% on RA, HR 130. Pt reports fall x2 Tuesday and Friday with bilateral leg pain. EMS also reports patient takes hydrocodone and xanax.

## 2015-09-28 ENCOUNTER — Inpatient Hospital Stay (HOSPITAL_COMMUNITY): Payer: Medicare Other

## 2015-09-28 DIAGNOSIS — I10 Essential (primary) hypertension: Secondary | ICD-10-CM

## 2015-09-28 DIAGNOSIS — I872 Venous insufficiency (chronic) (peripheral): Secondary | ICD-10-CM

## 2015-09-28 DIAGNOSIS — I481 Persistent atrial fibrillation: Secondary | ICD-10-CM

## 2015-09-28 DIAGNOSIS — I519 Heart disease, unspecified: Secondary | ICD-10-CM

## 2015-09-28 DIAGNOSIS — I4891 Unspecified atrial fibrillation: Secondary | ICD-10-CM

## 2015-09-28 DIAGNOSIS — I5033 Acute on chronic diastolic (congestive) heart failure: Secondary | ICD-10-CM

## 2015-09-28 DIAGNOSIS — I2510 Atherosclerotic heart disease of native coronary artery without angina pectoris: Secondary | ICD-10-CM

## 2015-09-28 LAB — CBC
HCT: 36 % (ref 36.0–46.0)
HEMOGLOBIN: 11.8 g/dL — AB (ref 12.0–15.0)
MCH: 29.5 pg (ref 26.0–34.0)
MCHC: 32.8 g/dL (ref 30.0–36.0)
MCV: 90 fL (ref 78.0–100.0)
PLATELETS: 175 10*3/uL (ref 150–400)
RBC: 4 MIL/uL (ref 3.87–5.11)
RDW: 14.3 % (ref 11.5–15.5)
WBC: 19.8 10*3/uL — ABNORMAL HIGH (ref 4.0–10.5)

## 2015-09-28 LAB — MRSA PCR SCREENING: MRSA BY PCR: NEGATIVE

## 2015-09-28 LAB — BASIC METABOLIC PANEL
Anion gap: 11 (ref 5–15)
BUN: 43 mg/dL — AB (ref 6–20)
CHLORIDE: 105 mmol/L (ref 101–111)
CO2: 21 mmol/L — ABNORMAL LOW (ref 22–32)
CREATININE: 1.27 mg/dL — AB (ref 0.44–1.00)
Calcium: 8.2 mg/dL — ABNORMAL LOW (ref 8.9–10.3)
GFR calc Af Amer: 43 mL/min — ABNORMAL LOW (ref >60)
GFR calc non Af Amer: 37 mL/min — ABNORMAL LOW (ref >60)
GLUCOSE: 163 mg/dL — AB (ref 65–99)
Potassium: 3.8 mmol/L (ref 3.5–5.1)
SODIUM: 137 mmol/L (ref 135–145)

## 2015-09-28 LAB — TROPONIN I
TROPONIN I: 0.03 ng/mL (ref <0.031)
TROPONIN I: 0.05 ng/mL — AB (ref <0.031)

## 2015-09-28 LAB — GLUCOSE, CAPILLARY
GLUCOSE-CAPILLARY: 256 mg/dL — AB (ref 65–99)
GLUCOSE-CAPILLARY: 138 mg/dL — AB (ref 65–99)
GLUCOSE-CAPILLARY: 184 mg/dL — AB (ref 65–99)
Glucose-Capillary: 162 mg/dL — ABNORMAL HIGH (ref 65–99)

## 2015-09-28 LAB — ECHOCARDIOGRAM COMPLETE
CHL CUP MV DEC (S): 155
CHL CUP TV REG PEAK VELOCITY: 249 cm/s
E/e' ratio: 9.41
EWDT: 155 ms
FS: 25 % — AB (ref 28–44)
Height: 62 in
IVS/LV PW RATIO, ED: 1.11
LA vol index: 41.8 mL/m2
LADIAMINDEX: 2.24 cm/m2
LASIZE: 38 mm
LAVOL: 71.1 mL
LAVOLA4C: 65.8 mL
LDCA: 3.14 cm2
LEFT ATRIUM END SYS DIAM: 38 mm
LV E/e' medial: 9.41
LV E/e'average: 9.41
LV PW d: 11.7 mm — AB (ref 0.6–1.1)
LV TDI E'MEDIAL: 5.87
LV e' LATERAL: 10.2 cm/s
LVOT diameter: 20 mm
MV pk E vel: 96 m/s
MVPG: 4 mmHg
RV TAPSE: 13.9 mm
TDI e' lateral: 10.2
TRMAXVEL: 249 cm/s
Weight: 2433.88 oz

## 2015-09-28 LAB — HEPARIN LEVEL (UNFRACTIONATED)
HEPARIN UNFRACTIONATED: 0.15 [IU]/mL — AB (ref 0.30–0.70)
HEPARIN UNFRACTIONATED: 0.27 [IU]/mL — AB (ref 0.30–0.70)

## 2015-09-28 MED ORDER — HEPARIN (PORCINE) IN NACL 100-0.45 UNIT/ML-% IJ SOLN
1350.0000 [IU]/h | INTRAMUSCULAR | Status: DC
  Filled 2015-09-28: qty 250

## 2015-09-28 MED ORDER — HEPARIN BOLUS VIA INFUSION
1500.0000 [IU] | Freq: Once | INTRAVENOUS | Status: AC
  Administered 2015-09-28: 1500 [IU] via INTRAVENOUS
  Filled 2015-09-28: qty 1500

## 2015-09-28 MED ORDER — CEFTRIAXONE SODIUM 1 G IJ SOLR
1.0000 g | INTRAMUSCULAR | Status: DC
  Administered 2015-09-28: 1 g via INTRAVENOUS
  Filled 2015-09-28 (×3): qty 10

## 2015-09-28 MED ORDER — METOPROLOL TARTRATE 12.5 MG HALF TABLET
12.5000 mg | ORAL_TABLET | Freq: Two times a day (BID) | ORAL | Status: DC
  Administered 2015-09-28 – 2015-09-29 (×3): 12.5 mg via ORAL
  Filled 2015-09-28 (×2): qty 1

## 2015-09-28 MED ORDER — CETYLPYRIDINIUM CHLORIDE 0.05 % MT LIQD
7.0000 mL | Freq: Two times a day (BID) | OROMUCOSAL | Status: DC
  Administered 2015-09-28 – 2015-10-05 (×10): 7 mL via OROMUCOSAL

## 2015-09-28 NOTE — Care Management Note (Signed)
Case Management Note  Patient Details  Name: [name] MRN: [id number] Date of Birth: Aug 04, 1927  Subjective/Objective:  CHF, Afib with RVR, hx CABG                  Action/Plan: Discharge Planning:  PCP-GUTIERREZ, JAVIER MD  NCM spoke to pt at bedside. 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 She lives at home with significant other for 63 years. Her son, Kinsely Lafrenier # [phone] assist pt as needed. Will continue to follow as needed for dc needs. She has no DME at home. Pt drives to her appt.     Expected Discharge Date:                  Expected Discharge Plan:  Home/Self Care  In-House Referral:  NA  Discharge planning Services  CM Consult  Post Acute Care Choice:  NA Choice offered to:  NA    Status of Service:  In process, will continue to follow  If discussed at Long Length of Stay Meetings, dates discussed:    Additional Comments:  Erenest Rasher, RN 09/28/2015, 2:15 PM

## 2015-09-28 NOTE — Clinical Documentation Improvement (Signed)
Internal Medicine  Can the diagnosis of altered mental status be further specified?   Encephalopathy - Alcoholic, Anoxic/Hypoxia, Drug Induced/Toxic (specify drug), Hepatic, Hypertensive, Hypoglycemic, Metabolic/Septic, Traumatic/post concussive, Wernicke, Other  Other  Clinically Undetermined  Associated diagnoses/conditions.  UTI  Supporting Information: ED note: presenting with altered mental status. T H&P: Acute encephalopathy: - probably from the infection.    Please exercise your independent, professional judgment when responding. A specific answer is not anticipated or expected. Please update your documentation within the medical record to reflect your response to this query. Thank you  Thank You,  [place] [phone]

## 2015-09-28 NOTE — Progress Notes (Signed)
*  PRELIMINARY RESULTS* Echocardiogram 2D Echocardiogram has been performed.  Leavy Cella 09/28/2015, 1:25 PM

## 2015-09-28 NOTE — Consult Note (Signed)
CARDIOLOGY CONSULT NOTE   [name] MRN: [id number] DOB/AGE: July 20, 1927 82 y.o.  Admit date: 09/27/2015  Primary Physician   Ria Bush, MD Primary Cardiologist   Dr. Acie Fredrickson Reason for Consultation   Elevated troponin and afib Requesting Physician  Dr. Karleen Hampshire  HPI: [name] is a 82 y.o. female with a history of CAD, CABG ( November 09, 1994), diabetes mellitus, anxiety, HTN, hyperlipidemia, DM, PUD, DVT on R leg, varicose veins bilaterally, followed until recently by wound center for chronic poorly healing ankle wound from CVI and recent URI who brought  to ER with complains of AMS, generalized weakness, fever and chills.   She did undergo laser ablation of L great saphenous vein 03/2014 [name]). On re evaluation 11/2014 - no further VVS interventions recommended. Suggested continued compression and dressing changes.   Last echo 01/2012 showed LV EF of 55-60%, mild concentric LVH, grade 1 DD and PA peak pressure of 32 mm Hg. Last Myoview 02/2014 was low risk with a medium sized, moderately intense fixed defect in the basal and mid lateral wall. In the setting of normal LVF, this most likely represents breast attenuation artifact. No inducible ischemia is noted. LV Ejection Fraction: 67%. LV Wall Motion: NL LV Function; NL Wall Motion.  Fall 6/16 and suffered a laceration on R arm. She Also had URI symptoms at that time. Seen by PCP 6/19 and 6/22 and felt viral URI. Chest x-ray shows chronic rotator cuff tear--> advised to get a sling and referred to orthopedics.  The patient is poor historians who lives with partner. One of her sons lives a few blocks away. She continues to have fever and chills with worsening fatigue.  Recently worsened generalized weakness and another fall a few days ago. Denies any prodrome or syncope. She states that her legs is giving out. Admits to having recent shortness of breath but no chest pain. No palpitation. She denies lower  extremity edema, orthopnea, PND, syncope, abdominal pain, melena or blood in her stool or urine. Complains of bilateral thigh pain. The patient was brought to the ED by EMS for altered mental status, lethargy, generalized weakness and slurred speech. Upon arrival, wheezing noted with oxygen saturation of 90% on room air with heart rate of 130. EKG shows A. fib with RVR and started on IV Cardizem and heparin. Currently discontinued Cardizem due to hypotension. CT of the head and spine cervical spine is unremarkable. Chest x-ray shows mild CHF. Bilateral hips with pelvis does not reveal any fractures or dislocation. Abx for possible URI. WBC elevated.    Past Medical History  Diagnosis Date  . Hypertension   . Coronary artery disease     CABG 1998  . Anxiety   . Diastolic dysfunction   . Hyperlipidemia   . PUD (peptic ulcer disease)   . Microcytic anemia   . Depression   . Diabetes mellitus     type 2, followed by endo.  . Diverticulosis   . Hemorrhoids   . History of heart attack   . Irritable bowel syndrome   . Chronic back pain 03/2012    lumbar DDD with herniation - s/p L S1 transforaminal ESI (10/2012 Dr. Sharlet Salina)  . Varicose veins   . Cystocele   . Hx of echocardiogram     a. Echo 10/13: mild LVH, EF 55-60%, Gr 1 diast dysfn, PASP 32  . Acute deep vein thrombosis (DVT) of distal vein of right lower extremity ([place]) 12/2013  Acute thrombus of R proximal gastrocnemius vein. Symptomatic provoked distal DVT  . Cellulitis of leg, left 01/16/2014  . PAD (peripheral artery disease) ([place]) 2013    ABI: R 0.91, L 0.83  . Chronic venous insufficiency   . Ankle wound   . Viral gastroenteritis due to [place] virus 05/13/2015     Past Surgical History  Procedure Laterality Date  . Tympanoplasty  1960s    R side  . Coronary artery bypass graft  1998  . Cataract surgery    . Cholecystectomy    . Appendectomy      per pt report  . Colonoscopy  02/2010    extensive diverticulosis  throughout, small internal hemorrhoids, rec rpt 5 yrs (Dr. Allyn Kenner)  . Hand surgery    . Lexiscan myoview  03/2011    Small basal inferolateral and anterolateral reversible perfusion defect suggests ischemia.  EF was normal.   old infarct, no new ischemia  . Toe surgery    . Abdominal hysterectomy  1975    TAH,BSO  . Vault prolapse a & p repair  2009    Dr [facility] hospital  . Dexa scan  09/2011    femur -0.8, forearm 0.6 => normal  . Esi  2014    L S1 transforaminal ESI x2 ([place]) without improvement  . Abi  2013    R 0.91, L 0.83  . Endovenous ablation saphenous vein w/ laser Left 03/2014    [name]  . Cardiovascular stress test  2015    low risk myoview ([place])    Allergies  Allergen Reactions  . Amoxicillin-Pot Clavulanate Other (See Comments)    Reaction:  Unknown   . Metformin And Related Diarrhea    I have reviewed the patient's current medications . aspirin EC  81 mg Oral Daily  . ferrous sulfate  325 mg Oral Q breakfast  . furosemide  20 mg Intravenous BID  . insulin aspart  0-15 Units Subcutaneous TID WC  . insulin aspart  0-5 Units Subcutaneous QHS  . metoprolol  2.5 mg Intravenous Q6H  . pantoprazole  40 mg Oral Daily  . pravastatin  20 mg Oral QHS  . sodium chloride flush  3 mL Intravenous Q12H  . traZODone  150 mg Oral QHS   . diltiazem (CARDIZEM) infusion Stopped (09/28/15 0315)  . heparin 1,000 Units/hr (09/28/15 0900)   acetaminophen **OR** acetaminophen, ALPRAZolam, HYDROcodone-acetaminophen, nitroGLYCERIN  Prior to Admission medications   Medication Sig Start Date End Date Taking? Authorizing Provider  acetaminophen (TYLENOL) 500 MG tablet Take 500 mg by mouth every 6 (six) hours as needed for mild pain.   Yes Historical Provider, MD  acidophilus (RISAQUAD) CAPS capsule Take 1 capsule by mouth daily.   Yes Historical Provider, MD  ALPRAZolam Duanne Moron) 1 MG tablet Take 0.5 mg by mouth 4 (four) times daily as needed for anxiety.    Yes Historical  Provider, MD  aspirin EC 81 MG tablet Take 81 mg by mouth daily.   Yes Historical Provider, MD  Dexlansoprazole (DEXILANT) 30 MG capsule Take 1 capsule (30 mg total) by mouth daily. 05/12/15  Yes Ria Bush, MD  ferrous sulfate 325 (65 FE) MG tablet Take 1 tablet (325 mg total) by mouth daily with breakfast. 09/06/14  Yes Ria Bush, MD  furosemide (LASIX) 20 MG tablet Take 20 mg by mouth daily.   Yes Historical Provider, MD  glucosamine-chondroitin 500-400 MG tablet Take 1 tablet by mouth daily.    Yes Historical Provider, MD  HYDROcodone-acetaminophen (NORCO/VICODIN) 5-325 MG tablet Take 0.5-1 tablets by mouth every 6 (six) hours as needed for moderate pain. 09/08/15  Yes Ria Bush, MD  loperamide (IMODIUM) 2 MG capsule Take 2 mg by mouth 4 (four) times daily as needed for diarrhea or loose stools.   Yes Historical Provider, MD  nystatin-triamcinolone ointment (MYCOLOG) Apply 1 application topically 2 (two) times daily as needed (for itching).   Yes Historical Provider, MD  pioglitazone (ACTOS) 30 MG tablet Take 30 mg by mouth every evening.    Yes Historical Provider, MD  pravastatin (PRAVACHOL) 20 MG tablet Take 20 mg by mouth at bedtime.   Yes Historical Provider, MD  ramipril (ALTACE) 5 MG capsule Take 5 mg by mouth every evening.    Yes Historical Provider, MD  traZODone (DESYREL) 150 MG tablet Take 150 mg by mouth at bedtime.    Yes Historical Provider, MD  triamcinolone cream (KENALOG) 0.1 % APPLY 1 APPLICATION TOPICALLY 2 (TWO) TIMES DAILY. 04/06/15  Yes Historical Provider, MD  vitamin B-12 (CYANOCOBALAMIN) 1000 MCG tablet Take 1,000 mcg by mouth daily.   Yes Historical Provider, MD  vitamin E 400 UNIT capsule Take 400 Units by mouth daily.   Yes Historical Provider, MD  nitroGLYCERIN (NITROSTAT) 0.4 MG SL tablet Place 0.4 mg under the tongue every 5 (five) minutes as needed for chest pain.     Thayer Headings, MD     Social History   Social History  . Marital Status:  Single    Spouse Name: N/A  . Number of Children: N/A  . Years of Education: N/A   Occupational History  . Waitress     Social History Main Topics  . Smoking status: Former Smoker    Quit date: 03/05/1979  . Smokeless tobacco: Never Used  . Alcohol Use: No  . Drug Use: No  . Sexual Activity: No     Comment: 1st intercourse 82yo.--- 2 sexual partners   Other Topics Concern  . 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   Independent for all ADLs, lives with a boyfriend.      Family Status  Relation Status Death Age  . Mother Deceased   . Father Deceased   . Paternal Grandfather Deceased   . Paternal Grandmother Deceased   . Maternal Grandfather Deceased   . Maternal Grandmother Deceased    Family History  Problem Relation Age of Onset  . Hypertension Mother   . Stroke Mother   . Diabetes Father   . Diabetes Sister   . Diabetes Brother   . Colon cancer Neg Hx      ROS:  Full 14 point review of systems complete and found to be negative unless listed above.  Physical Exam: Blood pressure 92/56, pulse 96, temperature 97.8 F (36.6 C), temperature source Oral, resp. rate 19, height 5\' 2"  (1.575 m), weight 152 lb 1.9 oz (69 kg), SpO2 97 %.  General: Well developed, well nourished, female in no acute distress who appears somewhat confused. Head: Eyes PERRLA, No xanthomas. Normocephalic and atraumatic, oropharynx without edema or exudate.  Lungs: Resp regular and unlabored, bibasilar rales Heart: Ir IR  no s3, s4, or murmurs..   Neck: No carotid bruits. No lymphadenopathy. No  JVD. Abdomen: Bowel sounds present, abdomen soft and non-tender without masses or hernias noted. Msk:  No spine or cva tenderness. No weakness, no joint deformities or effusions. Extremities: No clubbing, cyanosis. Radials  2+ and equal bilaterally. Significant bilateral lower extremity varicose vein. No edema noted. Minutes distal pulse. Neuro: Alert and  oriented X 3. No focal deficits noted. Psych:  Good affect, responds appropriately Skin: No rashes or lesions noted.  Labs:   Lab Results  Component Value Date   WBC 19.8* 09/28/2015   HGB 11.8* 09/28/2015   HCT 36.0 09/28/2015   MCV 90.0 09/28/2015   PLT 175 09/28/2015   No results for input(s): INR in the last 72 hours.  Recent Labs Lab 09/27/15 1434  NA 133*  K 4.1  CL 104  CO2 22  BUN 48*  CREATININE 1.25*  CALCIUM 8.4*  PROT 6.0*  BILITOT 0.8  ALKPHOS 80  ALT 20  AST 22  GLUCOSE 204*  ALBUMIN 2.2*   MAGNESIUM  Date Value Ref Range Status  12/28/2011 1.9 1.5 - 2.5 mg/dL Final    Recent Labs  09/27/15 2013 09/28/15 0052 09/28/15 0553  TROPONINI 0.04* 0.03 0.05*    Recent Labs  09/27/15 1445  TROPIPOC 0.02   PRO B NATRIURETIC PEPTIDE (BNP)  Date/Time Value Ref Range Status  01/22/2013 01:04 PM 99.0 0.0 - 100.0 pg/mL Final  12/28/2011 04:07 PM 106.0* 0.0 - 100.0 pg/mL Final   Lab Results  Component Value Date   CHOL 121* 04/13/2015   HDL 34* 04/13/2015   LDLCALC 65 04/13/2015   TRIG 108 04/13/2015   No results found for: DDIMER LIPASE  Date/Time Value Ref Range Status  05/12/2015 06:08 PM 33 11 - 51 U/L Final   TSH  Date/Time Value Ref Range Status  10/31/2014 03:34 PM 1.82 0.35 - 4.50 uIU/mL Final   FERRITIN  Date/Time Value Ref Range Status  09/02/2014 12:10 PM 42.2 10.0 - 291.0 ng/mL Final   IRON  Date/Time Value Ref Range Status  09/02/2014 12:10 PM 79 42 - 145 ug/dL Final    Echo: 01/2012 LV EF: 55% -  60%  ------------------------------------------------------------ Indications:   Dyspnea 786.09.  ------------------------------------------------------------ History:  PMH: Acquired from the patient and from the patient's chart. Palpitations and dyspnea. Coronary artery disease. PMH:  Myocardial infarction. Risk factors: Hypertension. Diabetes mellitus. Obese.  Dyslipidemia.  ------------------------------------------------------------ Study Conclusions  - Left ventricle: The cavity size was normal. There was mild concentric hypertrophy. Systolic function was normal. The estimated ejection fraction was in the range of 55% to 60%. Wall motion was normal; there were no regional wall motion abnormalities. Doppler parameters are consistent with abnormal left ventricular relaxation (grade 1 diastolic dysfunction). - Pulmonary arteries: PA peak pressure: 26mm Hg (S).  ECG:  A. fib at rate of 119 bpm.  Radiology:  Dg Chest 1 View  09/27/2015  CLINICAL DATA:  Mental status changes, bilateral hip pain after several recent falls. EXAM: CHEST 1 VIEW COMPARISON:  Chest x-ray dated 01/14/2009. FINDINGS: Median sternotomy wires appear intact and stable in alignment, with surgical changes of associated CABG. Heart is enlarged, possibly accentuated to some degree by the lordotic/supine patient positioning. Atherosclerotic calcifications noted at the aortic arch. Mild interstitial prominence, also possibly accentuated by the supine patient positioning. No confluent opacity to suggest a developing pneumonia. No pleural effusion or pneumothorax seen. Left lung base obscured by the enlarged heart. IMPRESSION: 1. Cardiomegaly. Mild interstitial prominence is probably accentuated by the supine patient positioning, possibly indicating a mild CHF/volume overload. No overt alveolar pulmonary edema. No evidence of pneumonia. 2. Aortic atherosclerosis. Electronically Signed   By: Franki Cabot M.D.   On: 09/27/2015 15:21   Ct Head  Wo Contrast  09/27/2015  CLINICAL DATA:  Altered mental status and fall. Lethargy and slurred speech. EXAM: CT HEAD WITHOUT CONTRAST CT CERVICAL SPINE WITHOUT CONTRAST TECHNIQUE: Multidetector CT imaging of the head and cervical spine was performed following the standard protocol without intravenous contrast. Multiplanar CT image  reconstructions of the cervical spine were also generated. COMPARISON:  Brain MRI dated 05/13/2010. FINDINGS: CT HEAD FINDINGS Again noted is generalized age related brain atrophy with commensurate dilatation of the ventricles and sulci. Mild chronic small vessel ischemic changes again noted within the bilateral periventricular and subcortical white matter regions. 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 There is no mass, hemorrhage, edema or other evidence of acute parenchymal abnormality. No extra-axial hemorrhage. No osseous abnormality. Mild mucosal thickening noted in the left frontal sinus, of uncertain age but likely chronic. Visualized paranasal sinuses otherwise clear. CT CERVICAL SPINE FINDINGS Degenerative changes throughout the cervical spine, moderate in degree with associated disc space narrowings and osseous spurring throughout. Combinations of disc-osteophytic bulges, uncovertebral joint hypertrophy and facet joint hypertrophy are causing mild to moderate central canal stenoses throughout with possible associated nerve root impingement. Slight retrolisthesis of C4 is likely related to the underlying degenerative changes. There is no fracture line or displaced fracture fragment identified. Facet joints are appropriately aligned throughout. Atherosclerotic calcifications noted at each carotid bulb region. Paravertebral soft tissues otherwise unremarkable. IMPRESSION: 1. No acute intracranial abnormality. No intracranial mass, hemorrhage or edema. Atrophy and chronic ischemic changes, as detailed above. 2. No fracture or acute subluxation within the cervical spine. Chronic/degenerative findings detailed above. Electronically Signed   By: Franki Cabot M.D.   On: 09/27/2015 16:08   Ct Cervical Spine Wo Contrast  09/27/2015  CLINICAL DATA:  Altered mental status and fall. Lethargy and slurred speech. EXAM: CT HEAD WITHOUT CONTRAST CT CERVICAL  SPINE WITHOUT CONTRAST TECHNIQUE: Multidetector CT imaging of the head and cervical spine was performed following the standard protocol without intravenous contrast. Multiplanar CT image reconstructions of the cervical spine were also generated. COMPARISON:  Brain MRI dated 05/13/2010. FINDINGS: CT HEAD FINDINGS Again noted is generalized age related brain atrophy with commensurate dilatation of the ventricles and sulci. Mild chronic small vessel ischemic changes again noted within the bilateral periventricular and subcortical white matter regions. 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 There is no mass, hemorrhage, edema or other evidence of acute parenchymal abnormality. No extra-axial hemorrhage. No osseous abnormality. Mild mucosal thickening noted in the left frontal sinus, of uncertain age but likely chronic. Visualized paranasal sinuses otherwise clear. CT CERVICAL SPINE FINDINGS Degenerative changes throughout the cervical spine, moderate in degree with associated disc space narrowings and osseous spurring throughout. Combinations of disc-osteophytic bulges, uncovertebral joint hypertrophy and facet joint hypertrophy are causing mild to moderate central canal stenoses throughout with possible associated nerve root impingement. Slight retrolisthesis of C4 is likely related to the underlying degenerative changes. There is no fracture line or displaced fracture fragment identified. Facet joints are appropriately aligned throughout. Atherosclerotic calcifications noted at each carotid bulb region. Paravertebral soft tissues otherwise unremarkable. IMPRESSION: 1. No acute intracranial abnormality. No intracranial mass, hemorrhage or edema. Atrophy and chronic ischemic changes, as detailed above. 2. No fracture or acute subluxation within the cervical spine. Chronic/degenerative findings detailed above. Electronically Signed   By: Franki Cabot M.D.    On: 09/27/2015 16:08   Dg Hips Bilat With Pelvis 3-4 Views  09/27/2015  CLINICAL DATA:  Mental status changes, recent falls with bilateral hip pain. EXAM: DG HIP (WITH OR WITHOUT PELVIS) 3-4V BILAT COMPARISON:  None. FINDINGS: Single view of the pelvis and two views of each hip are provided. Osseous alignment is normal. There is osteopenia which limits characterization of osseous detail, however, there is no fracture line or displaced fracture fragment identified. Mild degenerative change noted at each hip and within the scoliotic lower lumbar spine. Additional degenerative changes/scleroses seen at the sacroiliac joint spaces. Calcified atherosclerotic changes noted within soft tissues about the pelvis and proximal femurs. Soft tissues about the pelvis and hips are otherwise unremarkable. IMPRESSION: No acute findings. No osseous fracture or dislocation. Osteopenia. Mild degenerative change. Atherosclerotic calcifications. Electronically Signed   By: Franki Cabot M.D.   On: 09/27/2015 15:18    ASSESSMENT AND PLAN:     1. A. fib with RVR - New onset. Started on IV Cardizem initially and her rate was improved now discontinued due to hypotension. Currently rate in 100s. CHADSVASCS score of 8. Currently on IV heparin for anticoagulation. ? Good candidate for long-term anticoagulation due to multiple fall recently. She will need PT OT evaluation at discharge to determine her stability. - Likely infection as a cause. Pending echocardiogram. - Currently on Lopressor IV 2.5 mg every 6 hours.  Will change to metoprolol 12.5mg  BID and titrate further as BP allows.   2. Acute CHF - Mild volume overload on chest x-ray. Bibasilar rales on exam. BNP of 376.7. Unknown etiology likely related to rate. Continue IV Lasix 20 mg twice a day. Hypotension limiting aggressive diuresis.  3. Elevated troponin - Troponin of 0.04--> 0.03--> 0.05. Likely demand ischemia in setting of A. fib RVR and history of present  illness.  4. Acute respiratory failure with hypoxia - Per primary.  5. CAD s/p CABG - No anginal pain  6. Rx of R leg DVT - Patient complains of bilateral leg/thigh pain. Pending LE doppler with ABI. Significant bilateral varicose vein.  7. UTI 8. HLD - continue statin 9. DM  SignedLeanor Kail, PA 09/28/2015, 12:36 PM Pager [phone]  Co-Sign MD  Patient seen and examined. Agree with assessment and plan.  [name] is an 82 year old Caucasian female who has known CAD and underwent CABG revascularization surgery in August 1996.  She has a history of diabetes mellitus, hypertension, hyperlipidemia, peptic ulcer disease, and also has a history of remote DVT, as well as varicose veins bilaterally.  She underwent laser ablation of her left greater saphenous vein in December 2015.  In 2013 and echo Doppler study showed an EF of 55-60%.  She had mild concentric LVH with normal systolic function, grade 1 diastolic dysfunction, and mild pulmonary hypertension.  A nuclear perfusion study in November 2015 revealed probable breast attenuation artifact without definitive scar or ischemia.  Ejection fraction was 67% and she had normal wall motion.  She was admitted yesterday with underlies weakness, altered mental status, and was found to be in atrial fibrillation with a rapid ventricular response.  She was started on intravenous Cardizem and heparin therapy, however, developed hypotension leading to discontinuance of Cardizem.  She is felt to have a urinary tract infection contributed to leukocytosis.  A chest x-ray revealed mild CHF.  Presently, her ventricular rate is improved in the 90s, but she still in atrial fibrillation.  The patient states that for the last several years she has had numerous falls and as result her fall risk will most likely preclude long-term systemic  anticoagulation.  At present, rate control is being done with low-dose metoprolol at 12.5 mg twice a day.  Her BNP  is mildly elevated at 376 and most likely is consistent with diastolic heart failure.  At present, would continue low dose  IV Lasix.  A 2-D echo Doppler study was just completed to reassess LV systolic and diastolic function, valvular architecture and pulmonary pressures.  Her troponins were minimally elevated in a plateau pattern and most likely due to demand ischemia rather than acute coronary syndrome.  Will follow the patient with you.   Troy Sine, MD, [facility] 09/28/2015 3:06 PM

## 2015-09-28 NOTE — Progress Notes (Signed)
PROGRESS NOTE    [name]  [id number] DOB: 1927/05/05 DOA: 09/27/2015 PCP: Ria Bush, MD    Brief Narrative: 82 year old lady admitted for fever, chills and generalized malaise, was found to have afib with RVR and a UTI.   Assessment & Plan:   Active Problems:   Coronary artery disease   Anxiety   Hypertension   Diastolic dysfunction   Hyperlipidemia   Peptic ulcer disease   Dysphagia   Diabetic polyneuropathy (HCC)   Venous ulcer of leg (HCC)   Diabetes mellitus type 2 with peripheral artery disease (HCC)   Chronic venous insufficiency   Malnutrition of moderate degree   Acute respiratory failure (HCC)   Acute on chronic diastolic heart failure (HCC)   Persistent atrial fibrillation (HCC)   Atrial fibrillation: Persistent, new onset, off cardizem gtt .  Currently on IV heparin and po metoprolol.  Cardiology consulted and recommendations given.  Echocardiogram showed good LVef, without any regional wall abnormalities.  Diastolic dysfunction could not be determined .     Diabetes mellitus:  CBG (last 3)   Recent Labs  09/28/15 0931 09/28/15 1221 09/28/15 1629  GLUCAP 256* 162* 138*    Resume SSI.  HGBA1C is pending.    Fever at home, probably from the urinary tract infection.  Though she denies any symptoms.  E coli UTI, currently on rocephin and sensitivities pending.    Leukocytosis probably from UTI.    ACUTE on chronic diastolic heart failure: On IV lasix.  Diuresed about 1 lit since yesterday.  Daily weights.  Appreciate cardiology recommendations.    Hyperlipidemia:  Resume pravachol.    DVT prophylaxis: HEPARIN Code Status: (Full) Family Communication: (NONE AT BEDSIDE today.  Disposition Plan: pending PT EVAL.    Consultants:   Cardiology.    Procedures: echocardiogram.   Antimicrobials: (IV rocephin from 6/26  Subjective: Reports doesn't feel good, feels sore all over.   Objective: Filed Vitals:   09/28/15 0900 09/28/15 0936 09/28/15 1232 09/28/15 1527  BP: 104/63 104/63 92/56 97/59   Pulse: 100 72 96 80  Temp:  98.4 F (36.9 C) 97.8 F (36.6 C) 97.7 F (36.5 C)  TempSrc:  Oral Oral Oral  Resp: 23 33 19 31  Height:      Weight:      SpO2: 94% 94% 97% 95%    Intake/Output Summary (Last 24 hours) at 09/28/15 1644 Last data filed at 09/28/15 1531  Gross per 24 hour  Intake 446.73 ml  Output    875 ml  Net -428.27 ml   Filed Weights   09/27/15 2005  Weight: 69 kg (152 lb 1.9 oz)    Examination:  General exam: Appears calm and comfortable  Respiratory system: Clear to auscultation. Respiratory effort normal. Cardiovascular system: irregular, no murmer.  Gastrointestinal system: Abdomen is nondistended, soft and nontender. No organomegaly or masses felt. Normal bowel sounds heard. Central nervous system: Alert and appears oriented, no focal deficits.  Extremities: bilateral varicosities.  Skin: No rashes, lesions or ulcers Psychiatry: talkative, cheerful.    Data Reviewed: I have personally reviewed following labs and imaging studies  CBC:  Recent Labs Lab 09/27/15 1434 09/28/15 0155  WBC 18.5* 19.8*  NEUTROABS 15.7*  --   HGB 12.7 11.8*  HCT 37.9 36.0  MCV 90.5 90.0  PLT 172 0000000   Basic Metabolic Panel:  Recent Labs Lab 09/27/15 1434 09/28/15 0545  NA 133* 137  K 4.1 3.8  CL 104 105  CO2 22  21*  GLUCOSE 204* 163*  BUN 48* 43*  CREATININE 1.25* 1.27*  CALCIUM 8.4* 8.2*   GFR: Estimated Creatinine Clearance: 29 mL/min (by C-G formula based on Cr of 1.27). Liver Function Tests:  Recent Labs Lab 09/27/15 1434  AST 22  ALT 20  ALKPHOS 80  BILITOT 0.8  PROT 6.0*  ALBUMIN 2.2*   No results for input(s): LIPASE, AMYLASE in the last 168 hours. No results for input(s): AMMONIA in the last 168 hours. Coagulation Profile: No results for input(s): INR, PROTIME in the last 168 hours. Cardiac Enzymes:  Recent Labs Lab 09/27/15 2013  09/28/15 0052 09/28/15 0553  TROPONINI 0.04* 0.03 0.05*   BNP (last 3 results) No results for input(s): PROBNP in the last 8760 hours. HbA1C: No results for input(s): HGBA1C in the last 72 hours. CBG:  Recent Labs Lab 09/27/15 1319 09/27/15 2109 09/28/15 0931 09/28/15 1221  GLUCAP 245* 278* 256* 162*   Lipid Profile: No results for input(s): CHOL, HDL, LDLCALC, TRIG, CHOLHDL, LDLDIRECT in the last 72 hours. Thyroid Function Tests: No results for input(s): TSH, T4TOTAL, [name]4, T3FREE, THYROIDAB in the last 72 hours. Anemia Panel: No results for input(s): VITAMINB12, FOLATE, FERRITIN, TIBC, IRON, RETICCTPCT in the last 72 hours. Sepsis Labs:  Recent Labs Lab 09/27/15 1436  LATICACIDVEN 1.12    Recent Results (from the past 240 hour(s))  Urine culture     Status: Abnormal (Preliminary result)   Collection Time: 09/27/15  3:20 PM  Result Value Ref Range Status   Specimen Description URINE, CLEAN CATCH  Final   Special Requests NONE  Final   Culture >=100,000 COLONIES/mL ESCHERICHIA COLI (A)  Final   Report Status PENDING  Incomplete  MRSA PCR Screening     Status: None   Collection Time: 09/27/15 10:29 PM  Result Value Ref Range Status   MRSA by PCR NEGATIVE NEGATIVE Final    Comment:        The GeneXpert MRSA Assay (FDA approved for NASAL specimens only), is one component of a comprehensive MRSA colonization surveillance program. It is not intended to diagnose MRSA infection nor to guide or monitor treatment for MRSA infections.          Radiology Studies: Dg Chest 1 View  09/27/2015  CLINICAL DATA:  Mental status changes, bilateral hip pain after several recent falls. EXAM: CHEST 1 VIEW COMPARISON:  Chest x-ray dated 01/14/2009. FINDINGS: Median sternotomy wires appear intact and stable in alignment, with surgical changes of associated CABG. Heart is enlarged, possibly accentuated to some degree by the lordotic/supine patient positioning. Atherosclerotic  calcifications noted at the aortic arch. Mild interstitial prominence, also possibly accentuated by the supine patient positioning. No confluent opacity to suggest a developing pneumonia. No pleural effusion or pneumothorax seen. Left lung base obscured by the enlarged heart. IMPRESSION: 1. Cardiomegaly. Mild interstitial prominence is probably accentuated by the supine patient positioning, possibly indicating a mild CHF/volume overload. No overt alveolar pulmonary edema. No evidence of pneumonia. 2. Aortic atherosclerosis. Electronically Signed   By: Franki Cabot M.D.   On: 09/27/2015 15:21   Ct Head Wo Contrast  09/27/2015  CLINICAL DATA:  Altered mental status and fall. Lethargy and slurred speech. EXAM: CT HEAD WITHOUT CONTRAST CT CERVICAL SPINE WITHOUT CONTRAST TECHNIQUE: Multidetector CT imaging of the head and cervical spine was performed following the standard protocol without intravenous contrast. Multiplanar CT image reconstructions of the cervical spine were also generated. COMPARISON:  Brain MRI dated 05/13/2010. FINDINGS: CT HEAD FINDINGS Again  noted is generalized age related brain atrophy with commensurate dilatation of the ventricles and sulci. Mild chronic small vessel ischemic changes again noted within the bilateral periventricular and subcortical white matter regions. 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 There is no mass, hemorrhage, edema or other evidence of acute parenchymal abnormality. No extra-axial hemorrhage. No osseous abnormality. Mild mucosal thickening noted in the left frontal sinus, of uncertain age but likely chronic. Visualized paranasal sinuses otherwise clear. CT CERVICAL SPINE FINDINGS Degenerative changes throughout the cervical spine, moderate in degree with associated disc space narrowings and osseous spurring throughout. Combinations of disc-osteophytic bulges, uncovertebral joint hypertrophy and facet joint  hypertrophy are causing mild to moderate central canal stenoses throughout with possible associated nerve root impingement. Slight retrolisthesis of C4 is likely related to the underlying degenerative changes. There is no fracture line or displaced fracture fragment identified. Facet joints are appropriately aligned throughout. Atherosclerotic calcifications noted at each carotid bulb region. Paravertebral soft tissues otherwise unremarkable. IMPRESSION: 1. No acute intracranial abnormality. No intracranial mass, hemorrhage or edema. Atrophy and chronic ischemic changes, as detailed above. 2. No fracture or acute subluxation within the cervical spine. Chronic/degenerative findings detailed above. Electronically Signed   By: Franki Cabot M.D.   On: 09/27/2015 16:08   Ct Cervical Spine Wo Contrast  09/27/2015  CLINICAL DATA:  Altered mental status and fall. Lethargy and slurred speech. EXAM: CT HEAD WITHOUT CONTRAST CT CERVICAL SPINE WITHOUT CONTRAST TECHNIQUE: Multidetector CT imaging of the head and cervical spine was performed following the standard protocol without intravenous contrast. Multiplanar CT image reconstructions of the cervical spine were also generated. COMPARISON:  Brain MRI dated 05/13/2010. FINDINGS: CT HEAD FINDINGS Again noted is generalized age related brain atrophy with commensurate dilatation of the ventricles and sulci. Mild chronic small vessel ischemic changes again noted within the bilateral periventricular and subcortical white matter regions. 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 There is no mass, hemorrhage, edema or other evidence of acute parenchymal abnormality. No extra-axial hemorrhage. No osseous abnormality. Mild mucosal thickening noted in the left frontal sinus, of uncertain age but likely chronic. Visualized paranasal sinuses otherwise clear. CT CERVICAL SPINE FINDINGS Degenerative changes throughout the cervical  spine, moderate in degree with associated disc space narrowings and osseous spurring throughout. Combinations of disc-osteophytic bulges, uncovertebral joint hypertrophy and facet joint hypertrophy are causing mild to moderate central canal stenoses throughout with possible associated nerve root impingement. Slight retrolisthesis of C4 is likely related to the underlying degenerative changes. There is no fracture line or displaced fracture fragment identified. Facet joints are appropriately aligned throughout. Atherosclerotic calcifications noted at each carotid bulb region. Paravertebral soft tissues otherwise unremarkable. IMPRESSION: 1. No acute intracranial abnormality. No intracranial mass, hemorrhage or edema. Atrophy and chronic ischemic changes, as detailed above. 2. No fracture or acute subluxation within the cervical spine. Chronic/degenerative findings detailed above. Electronically Signed   By: Franki Cabot M.D.   On: 09/27/2015 16:08   Dg Hips Bilat With Pelvis 3-4 Views  09/27/2015  CLINICAL DATA:  Mental status changes, recent falls with bilateral hip pain. EXAM: DG HIP (WITH OR WITHOUT PELVIS) 3-4V BILAT COMPARISON:  None. FINDINGS: Single view of the pelvis and two views of each hip are provided. Osseous alignment is normal. There is osteopenia which limits characterization of osseous detail, however, there is no fracture line or displaced fracture fragment identified. Mild degenerative change noted at each hip  and within the scoliotic lower lumbar spine. Additional degenerative changes/scleroses seen at the sacroiliac joint spaces. Calcified atherosclerotic changes noted within soft tissues about the pelvis and proximal femurs. Soft tissues about the pelvis and hips are otherwise unremarkable. IMPRESSION: No acute findings. No osseous fracture or dislocation. Osteopenia. Mild degenerative change. Atherosclerotic calcifications. Electronically Signed   By: Franki Cabot M.D.   On: 09/27/2015  15:18        Scheduled Meds: . aspirin EC  81 mg Oral Daily  . cefTRIAXone (ROCEPHIN)  IV  1 g Intravenous Q24H  . ferrous sulfate  325 mg Oral Q breakfast  . furosemide  20 mg Intravenous BID  . insulin aspart  0-15 Units Subcutaneous TID WC  . insulin aspart  0-5 Units Subcutaneous QHS  . metoprolol tartrate  12.5 mg Oral BID  . pantoprazole  40 mg Oral Daily  . pravastatin  20 mg Oral QHS  . sodium chloride flush  3 mL Intravenous Q12H  . traZODone  150 mg Oral QHS   Continuous Infusions: . diltiazem (CARDIZEM) infusion Stopped (09/28/15 0315)  . heparin 1,250 Units/hr (09/28/15 1330)     LOS: 1 day    Time spent: 35 minutes.     Hosie Poisson, MD [facility] Pager [phone]  If 7PM-7AM, please contact night-coverage [email or web address] Password [facility] 09/28/2015, 4:44 PM

## 2015-09-28 NOTE — Progress Notes (Signed)
ANTICOAGULATION CONSULT NOTE - Follow Up Consult  Pharmacy Consult for heparin Indication: afib; r/o VTE  Allergies  Allergen Reactions  . Amoxicillin-Pot Clavulanate Other (See Comments)    Reaction:  Unknown   . Metformin And Related Diarrhea    Patient Measurements: Height: 5\' 2"  (157.5 cm) Weight: 152 lb 1.9 oz (69 kg) IBW/kg (Calculated) : 50.1 Heparin Dosing Weight: 70 kg  Vital Signs: Temp: 99.5 F (37.5 C) (06/26 2110) Temp Source: Oral (06/26 2110) BP: 108/63 mmHg (06/26 2110) Pulse Rate: 81 (06/26 1610)  Labs:  Recent Labs  09/27/15 1434 09/27/15 2013 09/28/15 0052 09/28/15 0155 09/28/15 0545 09/28/15 0553 09/28/15 1157 09/28/15 2157  HGB 12.7  --   --  11.8*  --   --   --   --   HCT 37.9  --   --  36.0  --   --   --   --   PLT 172  --   --  175  --   --   --   --   HEPARINUNFRC  --   --   --  <0.10*  --   --  0.15* 0.27*  CREATININE 1.25*  --   --   --  1.27*  --   --   --   TROPONINI  --  0.04* 0.03  --   --  0.05*  --   --     Estimated Creatinine Clearance: 29 mL/min (by C-G formula based on Cr of 1.27).   Medications:  Scheduled:  . antiseptic oral rinse  7 mL Mouth Rinse BID  . aspirin EC  81 mg Oral Daily  . cefTRIAXone (ROCEPHIN)  IV  1 g Intravenous Q24H  . ferrous sulfate  325 mg Oral Q breakfast  . furosemide  20 mg Intravenous BID  . insulin aspart  0-15 Units Subcutaneous TID WC  . insulin aspart  0-5 Units Subcutaneous QHS  . metoprolol tartrate  12.5 mg Oral BID  . pantoprazole  40 mg Oral Daily  . pravastatin  20 mg Oral QHS  . sodium chloride flush  3 mL Intravenous Q12H  . traZODone  150 mg Oral QHS   Infusions:  . diltiazem (CARDIZEM) infusion Stopped (09/28/15 0315)  . heparin 1,250 Units/hr (09/28/15 1330)    Assessment: 82 yo female with afib and r/o VTE is currently on slightly subtherapeutic heparin.  Heparin level is 0.27.  No problem with infusion per RN  Goal of Therapy:  Heparin level 0.3-0.7  units/ml Monitor platelets by anticoagulation protocol: Yes   Plan:  - increase heparin to 1350 units/hr - 8 hr heparin level  Ecko Beasley, Tsz-Yin 09/28/2015,10:31 PM

## 2015-09-28 NOTE — Progress Notes (Signed)
ANTICOAGULATION CONSULT NOTE - Follow Up Consult  Pharmacy Consult for Heparin  Indication: atrial fibrillation, rule out VTE  Allergies  Allergen Reactions  . Amoxicillin-Pot Clavulanate Other (See Comments)    Reaction:  Unknown   . Metformin And Related Diarrhea    Patient Measurements: Height: 5\' 2"  (157.5 cm) Weight: 152 lb 1.9 oz (69 kg) IBW/kg (Calculated) : 50.1  Vital Signs: Temp: 98.9 F (37.2 C) (06/25 2323) Temp Source: Axillary (06/25 2323) BP: 92/60 mmHg (06/26 0130) Pulse Rate: 71 (06/26 0130)  Labs:  Recent Labs  09/27/15 1434 09/27/15 2013 09/28/15 0052 09/28/15 0155  HGB 12.7  --   --  11.8*  HCT 37.9  --   --  36.0  PLT 172  --   --  175  HEPARINUNFRC  --   --   --  <0.10*  CREATININE 1.25*  --   --   --   TROPONINI  --  0.04* 0.03  --     Estimated Creatinine Clearance: 29.4 mL/min (by C-G formula based on Cr of 1.25).  Assessment: Initial heparin level is undetectable, no issues per RN.   Goal of Therapy:  Heparin level 0.3-0.7 units/ml Monitor platelets by anticoagulation protocol: Yes   Plan:  -Heparin 1500 units BOLUS -Inc heparin to 1000 units/hr -1100 HL  Dekendrick Uzelac 09/28/2015,2:52 AM

## 2015-09-28 NOTE — Progress Notes (Signed)
Pt received from 3S. Pt oriented to room and equipment. VSS. Telemetry applied, CCMD notified x2. Call light within reach, will continue to monitor.   Fritz Pickerel, RN

## 2015-09-28 NOTE — Progress Notes (Signed)
ANTICOAGULATION CONSULT NOTE - Follow Up Consult  Pharmacy Consult:  Heparin  Indication: atrial fibrillation, rule out VTE  Allergies  Allergen Reactions  . Amoxicillin-Pot Clavulanate Other (See Comments)    Reaction:  Unknown   . Metformin And Related Diarrhea    Patient Measurements: Height: 5\' 2"  (157.5 cm) Weight: 152 lb 1.9 oz (69 kg) IBW/kg (Calculated) : 50.1  Heparin dosing weight = 70 kg  Vital Signs: Temp: 97.8 F (36.6 C) (06/26 1232) Temp Source: Oral (06/26 1232) BP: 92/56 mmHg (06/26 1232) Pulse Rate: 96 (06/26 1232)  Labs:  Recent Labs  09/27/15 1434 09/27/15 2013 09/28/15 0052 09/28/15 0155 09/28/15 0553 09/28/15 1157  HGB 12.7  --   --  11.8*  --   --   HCT 37.9  --   --  36.0  --   --   PLT 172  --   --  175  --   --   HEPARINUNFRC  --   --   --  <0.10*  --  0.15*  CREATININE 1.25*  --   --   --   --   --   TROPONINI  --  0.04* 0.03  --  0.05*  --     Estimated Creatinine Clearance: 29.4 mL/min (by C-G formula based on Cr of 1.25).   Assessment: 75 YOF presented with AMS and generalized weakness.  Patient also suffered several falls PTA.  Pharmacy consulted to manage heparin for Afib and rule out VTE.  Heparin level remains sub-therapeutic; no bleeding reported.  Goal of Therapy:  Heparin level 0.3-0.7 units/ml Monitor platelets by anticoagulation protocol: Yes    Plan:  - Increase heparin gtt to 1250 units/hr - Check 8 hr HL - Daily HL / CBC   Quantavia Frith D. Mina Marble, PharmD, BCPS Pager:  [phone] 09/28/2015, 1:18 PM

## 2015-09-29 ENCOUNTER — Encounter (HOSPITAL_COMMUNITY): Payer: Self-pay | Admitting: *Deleted

## 2015-09-29 ENCOUNTER — Inpatient Hospital Stay (HOSPITAL_COMMUNITY): Payer: Medicare Other

## 2015-09-29 DIAGNOSIS — M79606 Pain in leg, unspecified: Secondary | ICD-10-CM

## 2015-09-29 DIAGNOSIS — R0989 Other specified symptoms and signs involving the circulatory and respiratory systems: Secondary | ICD-10-CM

## 2015-09-29 DIAGNOSIS — F419 Anxiety disorder, unspecified: Secondary | ICD-10-CM

## 2015-09-29 LAB — URINE CULTURE: Culture: >=100000 — AB

## 2015-09-29 LAB — CBC
HCT: 36.1 % (ref 36.0–46.0)
Hemoglobin: 11.5 g/dL — ABNORMAL LOW (ref 12.0–15.0)
MCH: 28.8 pg (ref 26.0–34.0)
MCHC: 31.9 g/dL (ref 30.0–36.0)
MCV: 90.3 fL (ref 78.0–100.0)
PLATELETS: 193 10*3/uL (ref 150–400)
RBC: 4 MIL/uL (ref 3.87–5.11)
RDW: 14.5 % (ref 11.5–15.5)
WBC: 16 10*3/uL — AB (ref 4.0–10.5)

## 2015-09-29 LAB — GLUCOSE, CAPILLARY
GLUCOSE-CAPILLARY: 152 mg/dL — AB (ref 65–99)
GLUCOSE-CAPILLARY: 245 mg/dL — AB (ref 65–99)
GLUCOSE-CAPILLARY: 105 mg/dL — AB (ref 65–99)
GLUCOSE-CAPILLARY: 169 mg/dL — AB (ref 65–99)

## 2015-09-29 LAB — HEMOGLOBIN A1C
HEMOGLOBIN A1C: 7.1 % — AB (ref 4.8–5.6)
MEAN PLASMA GLUCOSE: 157 mg/dL

## 2015-09-29 LAB — HEPARIN LEVEL (UNFRACTIONATED): HEPARIN UNFRACTIONATED: 0.4 [IU]/mL (ref 0.30–0.70)

## 2015-09-29 MED ORDER — HEPARIN SODIUM (PORCINE) 5000 UNIT/ML IJ SOLN
5000.0000 [IU] | Freq: Three times a day (TID) | INTRAMUSCULAR | Status: DC
  Administered 2015-09-29 – 2015-10-05 (×17): 5000 [IU] via SUBCUTANEOUS
  Filled 2015-09-29 (×17): qty 1

## 2015-09-29 MED ORDER — METOPROLOL TARTRATE 25 MG PO TABS
25.0000 mg | ORAL_TABLET | Freq: Two times a day (BID) | ORAL | Status: DC
  Administered 2015-09-29 – 2015-09-30 (×2): 25 mg via ORAL
  Filled 2015-09-29 (×2): qty 1

## 2015-09-29 MED ORDER — FUROSEMIDE 20 MG PO TABS
20.0000 mg | ORAL_TABLET | Freq: Every day | ORAL | Status: DC
  Administered 2015-09-30 – 2015-10-05 (×6): 20 mg via ORAL
  Filled 2015-09-29 (×6): qty 1

## 2015-09-29 MED ORDER — CEPHALEXIN 500 MG PO CAPS
500.0000 mg | ORAL_CAPSULE | Freq: Two times a day (BID) | ORAL | Status: DC
  Administered 2015-09-29 – 2015-10-02 (×8): 500 mg via ORAL
  Filled 2015-09-29 (×9): qty 1

## 2015-09-29 MED ORDER — METOPROLOL TARTRATE 12.5 MG HALF TABLET
12.5000 mg | ORAL_TABLET | Freq: Once | ORAL | Status: AC
  Administered 2015-09-29: 12.5 mg via ORAL
  Filled 2015-09-29: qty 1

## 2015-09-29 NOTE — Progress Notes (Signed)
Patient Name: [name] Date of Encounter: 09/29/2015  Primary Cardiologist Dr. Fredrich Birks Commisso is a 82 y.o. female with a history of CAD, CABG ( November 09, 1994), diabetes mellitus, anxiety, HTN, hyperlipidemia, DM, PUD, DVT on R leg, varicose veins bilaterally, followed until recently by wound center for chronic poorly healing ankle wound from CVI and recent URI who brought to ER with complains of AMS, generalized weakness, fever and chills. Found to be afib with RVR   Active Problems:   Coronary artery disease   Anxiety   Hypertension   Diastolic dysfunction   Hyperlipidemia   Peptic ulcer disease   Dysphagia   Diabetic polyneuropathy (HCC)   Venous ulcer of leg (HCC)   Diabetes mellitus type 2 with peripheral artery disease (HCC)   Chronic venous insufficiency   Malnutrition of moderate degree   Acute respiratory failure (HCC)   Acute on chronic diastolic heart failure (HCC)   Persistent atrial fibrillation (HCC)    SUBJECTIVE  Complains about neck pain and bilateral upper thigh pain. No CP. SOB with walking only  CURRENT MEDS . antiseptic oral rinse  7 mL Mouth Rinse BID  . aspirin EC  81 mg Oral Daily  . cefTRIAXone (ROCEPHIN)  IV  1 g Intravenous Q24H  . ferrous sulfate  325 mg Oral Q breakfast  . furosemide  20 mg Intravenous BID  . insulin aspart  0-15 Units Subcutaneous TID WC  . insulin aspart  0-5 Units Subcutaneous QHS  . metoprolol tartrate  12.5 mg Oral BID  . pantoprazole  40 mg Oral Daily  . pravastatin  20 mg Oral QHS  . sodium chloride flush  3 mL Intravenous Q12H  . traZODone  150 mg Oral QHS    OBJECTIVE  Filed Vitals:   09/28/15 1527 09/28/15 1610 09/28/15 2110 09/29/15 0500  BP: 97/59 106/54 108/63 123/64  Pulse: 80 81    Temp: 97.7 F (36.5 C) 98.1 F (36.7 C) 99.5 F (37.5 C) 98.3 F (36.8 C)  TempSrc: Oral Oral Oral Oral  Resp: 31 22 20 18   Height:      Weight:    153 lb (69.4 kg)  SpO2: 95% 99% 98% 100%     Intake/Output Summary (Last 24 hours) at 09/29/15 1031 Last data filed at 09/29/15 0800  Gross per 24 hour  Intake    360 ml  Output   1600 ml  Net  -1240 ml   Filed Weights   09/27/15 2005 09/29/15 0500  Weight: 152 lb 1.9 oz (69 kg) 153 lb (69.4 kg)    PHYSICAL EXAM  General: Pleasant, NAD. Neuro: Alert and oriented X 3. Moves all extremities spontaneously. Psych: Normal affect. HEENT:  Normal  Neck: Supple without bruits or JVD. Lungs:  Resp regular and unlabored, CTA. Heart: irregular. no s3, s4, or murmurs. Abdomen: Soft, non-tender, non-distended, BS + x 4.  Extremities: No clubbing, cyanosis or edema. DP/PT/Radials 2+ and equal bilaterally.  Accessory Clinical Findings  CBC  Recent Labs  09/27/15 1434 09/28/15 0155 09/29/15 0254  WBC 18.5* 19.8* 16.0*  NEUTROABS 15.7*  --   --   HGB 12.7 11.8* 11.5*  HCT 37.9 36.0 36.1  MCV 90.5 90.0 90.3  PLT 172 175 0000000   Basic Metabolic Panel  Recent Labs  09/27/15 1434 09/28/15 0545  NA 133* 137  K 4.1 3.8  CL 104 105  CO2 22 21*  GLUCOSE 204* 163*  BUN 48* 43*  CREATININE  1.25* 1.27*  CALCIUM 8.4* 8.2*   Liver Function Tests  Recent Labs  09/27/15 1434  AST 22  ALT 20  ALKPHOS 80  BILITOT 0.8  PROT 6.0*  ALBUMIN 2.2*   Cardiac Enzymes  Recent Labs  09/27/15 2013 09/28/15 0052 09/28/15 0553  TROPONINI 0.04* 0.03 0.05*   Hemoglobin A1C  Recent Labs  09/27/15 2100  HGBA1C 7.1*    TELE Converted to NSR around 3AM, but went back into afib around 6AM.     ECG  No new EKG  Echocardiogram 09/28/2015  - Left ventricle: The cavity size was normal. There was moderate  concentric hypertrophy. Systolic function was normal. Wall motion  was normal; there were no regional wall motion abnormalities. The  study was not technically sufficient to allow evaluation of LV  diastolic dysfunction due to atrial fibrillation. - Aortic valve: There was trivial regurgitation. - Mitral valve:  Calcified annulus. Mildly thickened leaflets .  Transvalvular velocity was within the normal range. There was no  evidence for stenosis. There was trivial regurgitation. - Left atrium: The atrium was mildly dilated. - Right atrium: The atrium was normal in size. - Pulmonary arteries: Systolic pressure was within the normal  range. - Inferior vena cava: The vessel was normal in size. - Pericardium, extracardiac: There was no pericardial effusion.    Radiology/Studies  Dg Chest 1 View  09/27/2015  CLINICAL DATA:  Mental status changes, bilateral hip pain after several recent falls. EXAM: CHEST 1 VIEW COMPARISON:  Chest x-ray dated 01/14/2009. FINDINGS: Median sternotomy wires appear intact and stable in alignment, with surgical changes of associated CABG. Heart is enlarged, possibly accentuated to some degree by the lordotic/supine patient positioning. Atherosclerotic calcifications noted at the aortic arch. Mild interstitial prominence, also possibly accentuated by the supine patient positioning. No confluent opacity to suggest a developing pneumonia. No pleural effusion or pneumothorax seen. Left lung base obscured by the enlarged heart. IMPRESSION: 1. Cardiomegaly. Mild interstitial prominence is probably accentuated by the supine patient positioning, possibly indicating a mild CHF/volume overload. No overt alveolar pulmonary edema. No evidence of pneumonia. 2. Aortic atherosclerosis. Electronically Signed   By: Franki Cabot M.D.   On: 09/27/2015 15:21   Ct Head Wo Contrast  09/27/2015  CLINICAL DATA:  Altered mental status and fall. Lethargy and slurred speech. EXAM: CT HEAD WITHOUT CONTRAST CT CERVICAL SPINE WITHOUT CONTRAST TECHNIQUE: Multidetector CT imaging of the head and cervical spine was performed following the standard protocol without intravenous contrast. Multiplanar CT image reconstructions of the cervical spine were also generated. COMPARISON:  Brain MRI dated 05/13/2010.  FINDINGS: CT HEAD FINDINGS Again noted is generalized age related brain atrophy with commensurate dilatation of the ventricles and sulci. Mild chronic small vessel ischemic changes again noted within the bilateral periventricular and subcortical white matter regions. 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 There is no mass, hemorrhage, edema or other evidence of acute parenchymal abnormality. No extra-axial hemorrhage. No osseous abnormality. Mild mucosal thickening noted in the left frontal sinus, of uncertain age but likely chronic. Visualized paranasal sinuses otherwise clear. CT CERVICAL SPINE FINDINGS Degenerative changes throughout the cervical spine, moderate in degree with associated disc space narrowings and osseous spurring throughout. Combinations of disc-osteophytic bulges, uncovertebral joint hypertrophy and facet joint hypertrophy are causing mild to moderate central canal stenoses throughout with possible associated nerve root impingement. Slight retrolisthesis of C4 is likely related to the underlying degenerative changes. There is no fracture line  or displaced fracture fragment identified. Facet joints are appropriately aligned throughout. Atherosclerotic calcifications noted at each carotid bulb region. Paravertebral soft tissues otherwise unremarkable. IMPRESSION: 1. No acute intracranial abnormality. No intracranial mass, hemorrhage or edema. Atrophy and chronic ischemic changes, as detailed above. 2. No fracture or acute subluxation within the cervical spine. Chronic/degenerative findings detailed above. Electronically Signed   By: Franki Cabot M.D.   On: 09/27/2015 16:08   Ct Cervical Spine Wo Contrast  09/27/2015  CLINICAL DATA:  Altered mental status and fall. Lethargy and slurred speech. EXAM: CT HEAD WITHOUT CONTRAST CT CERVICAL SPINE WITHOUT CONTRAST TECHNIQUE: Multidetector CT imaging of the head and cervical spine was  performed following the standard protocol without intravenous contrast. Multiplanar CT image reconstructions of the cervical spine were also generated. COMPARISON:  Brain MRI dated 05/13/2010. FINDINGS: CT HEAD FINDINGS Again noted is generalized age related brain atrophy with commensurate dilatation of the ventricles and sulci. Mild chronic small vessel ischemic changes again noted within the bilateral periventricular and subcortical white matter regions. 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 There is no mass, hemorrhage, edema or other evidence of acute parenchymal abnormality. No extra-axial hemorrhage. No osseous abnormality. Mild mucosal thickening noted in the left frontal sinus, of uncertain age but likely chronic. Visualized paranasal sinuses otherwise clear. CT CERVICAL SPINE FINDINGS Degenerative changes throughout the cervical spine, moderate in degree with associated disc space narrowings and osseous spurring throughout. Combinations of disc-osteophytic bulges, uncovertebral joint hypertrophy and facet joint hypertrophy are causing mild to moderate central canal stenoses throughout with possible associated nerve root impingement. Slight retrolisthesis of C4 is likely related to the underlying degenerative changes. There is no fracture line or displaced fracture fragment identified. Facet joints are appropriately aligned throughout. Atherosclerotic calcifications noted at each carotid bulb region. Paravertebral soft tissues otherwise unremarkable. IMPRESSION: 1. No acute intracranial abnormality. No intracranial mass, hemorrhage or edema. Atrophy and chronic ischemic changes, as detailed above. 2. No fracture or acute subluxation within the cervical spine. Chronic/degenerative findings detailed above. Electronically Signed   By: Franki Cabot M.D.   On: 09/27/2015 16:08   Dg Humerus Right  09/24/2015  CLINICAL DATA:  Right upper arm pain.   Fall. EXAM: RIGHT HUMERUS - 2+ VIEW COMPARISON:  No recent prior. FINDINGS: Acromioclavicular and glenohumeral degenerative change. No acute bony or joint abnormality identified. No evidence of fracture or dislocation. High-riding right shoulder consistent with chronic rotator cuff tear . IMPRESSION: Degenerative changes right shoulder and elbow. High-riding right shoulder consistent with rotator cuff tear. No acute abnormality. Electronically Signed   By: Marcello Moores  Register   On: 09/24/2015 16:20   Dg Hips Bilat With Pelvis 3-4 Views  09/27/2015  CLINICAL DATA:  Mental status changes, recent falls with bilateral hip pain. EXAM: DG HIP (WITH OR WITHOUT PELVIS) 3-4V BILAT COMPARISON:  None. FINDINGS: Single view of the pelvis and two views of each hip are provided. Osseous alignment is normal. There is osteopenia which limits characterization of osseous detail, however, there is no fracture line or displaced fracture fragment identified. Mild degenerative change noted at each hip and within the scoliotic lower lumbar spine. Additional degenerative changes/scleroses seen at the sacroiliac joint spaces. Calcified atherosclerotic changes noted within soft tissues about the pelvis and proximal femurs. Soft tissues about the pelvis and hips are otherwise unremarkable. IMPRESSION: No acute findings. No osseous fracture or dislocation. Osteopenia. Mild degenerative change. Atherosclerotic calcifications. Electronically Signed   By: Franki Cabot  M.D.   On: 09/27/2015 15:18    ASSESSMENT AND PLAN  1. New afib with RVR  - went back to NSR around 3AM on 6/27, unfortunately converted to afib roughly 3 hours later. Just finished a walk, now afib with HR 120s  - her BP has improved, will increase metoprolol to 25mg  BID. May consider digoxin if needed. Continue ASA. Given h/o fall, would not be a good candidate for systemic anticoagulation  2. Acute diastolic HF in the setting of rapid HR  - Echo 09/28/2015 normal EF,  moderate LVH  - appears to be euvolemic on exam, will convert back to home dose of lasix.   3. Elevated trop: flat, likely demand ischemia  4. CAD s/p CABG: no obvious angina  5. UTI: culture grew E Coli  6. HLD 7. DM: hgb A1C 7.1 8. Bilateral upper thigh pain: per IM  Signed, Almyra Deforest PA-C Pager: [phone]  Patient seen and examined. Agree with assessment and plan. Echo reveal normal systolic function. Recurrent AF today;rate now 108;  agree with further titration of metoprolol. With fall risk not a good candidate for anticoagulation; on ASA. UTI on antibiotic RX   Troy Sine, MD, [facility] 09/29/2015 1:12 PM

## 2015-09-29 NOTE — Progress Notes (Signed)
VASCULAR LAB PRELIMINARY  ARTERIAL  ABI completed:    RIGHT    LEFT    PRESSURE WAVEFORM  PRESSURE WAVEFORM  BRACHIAL 108 Triphasic BRACHIAL 107 Triphasic  DP 84 Dampened monophasic DP 41 Severely dampened monophasic  AT   AT    PT 46 Severely dampened monophasic PT 74 Dampened monophasic  PER   PER    GREAT TOE  NA GREAT TOE  NA    RIGHT LEFT  ABI 0.78 0.69   ABI suggests bilateral moderate arterial insufficiency at rest.  09/29/2015 3:27 PM Maudry Mayhew, RVT, RDCS, RDMS

## 2015-09-29 NOTE — Progress Notes (Signed)
PROGRESS NOTE    [name]  [id number] DOB: 23-Aug-1927 DOA: 09/27/2015 PCP: Ria Bush, MD    Brief Narrative: 82 year old lady admitted for fever, chills and generalized malaise, was found to have afib with RVR and a UTI.   Assessment & Plan:   Active Problems:   Coronary artery disease   Anxiety   Hypertension   Diastolic dysfunction   Hyperlipidemia   Peptic ulcer disease   Dysphagia   Diabetic polyneuropathy (HCC)   Venous ulcer of leg (HCC)   Diabetes mellitus type 2 with peripheral artery disease (HCC)   Chronic venous insufficiency   Malnutrition of moderate degree   Acute respiratory failure (HCC)   Acute on chronic diastolic heart failure (HCC)   Persistent atrial fibrillation (HCC)   Atrial fibrillation: Persistent, new onset, off cardizem gtt .  Currently on IV heparin and po metoprolol.  Cardiology consulted and recommendations given. Increased her metoprolol dose to 25 mg BID.  Echocardiogram showed good LVef, without any regional wall abnormalities.  Diastolic dysfunction could not be determined . Will d/c the IV heparin, as she is not a good candidate to be on systemic anticoagulation for increased fall risk.     Diabetes mellitus:  CBG (last 3)   Recent Labs  09/28/15 2113 09/29/15 0612 09/29/15 1124  GLUCAP 184* 152* 245*    Resume SSI.  HGBA1C is 7.1   Fever at home, probably from the urinary tract infection.  Though she denies any symptoms.  E coli UTI, started on rocephin and changed to po keflex to complete the course.    Leukocytosis probably from UTI.    Acute on chronic diastolic heart failure: Improved, changed IV lasix to po lasix.  Daily weights.  Appreciate cardiology recommendations.   PAD/ bilateral varicosities, and bilateral rest/ leg pain:  S/p ablation of the left greater saphenous vein for painful varicosities two years ago.  Repeat ABI ordered this admission , please call vascular if it grossly  abnormaly, changed from last year.  Vascular were consulted on admission by EDP, who recommended getting ABI and to call them if needed.    Hyperlipidemia:  Resume pravachol.    DVT prophylaxis: HEPARIN sq Code Status: (Full) Family Communication: None at bedside today. Talked to her son over the phone on 6/27. Disposition Plan: pending PT EVAL. Possibly home.    Consultants:   Cardiology.    Procedures: echocardiogram.   Antimicrobials: (IV rocephin from 6/25, till 6/27 changed to oral keflex from 6/17.   Subjective: Feels a [place] better, denies any new complaints. Asked me what is wrong with her.   Objective: Filed Vitals:   09/28/15 1610 09/28/15 2110 09/29/15 0500 09/29/15 1344  BP: 106/54 108/63 123/64 124/83  Pulse: 81   99  Temp: 98.1 F (36.7 C) 99.5 F (37.5 C) 98.3 F (36.8 C) 99 F (37.2 C)  TempSrc: Oral Oral Oral Oral  Resp: 22 20 18 17   Height:      Weight:   69.4 kg (153 lb)   SpO2: 99% 98% 100% 99%    Intake/Output Summary (Last 24 hours) at 09/29/15 1440 Last data filed at 09/29/15 1300  Gross per 24 hour  Intake    480 ml  Output   1300 ml  Net   -820 ml   Filed Weights   09/27/15 2005 09/29/15 0500  Weight: 69 kg (152 lb 1.9 oz) 69.4 kg (153 lb)    Examination:  General exam: Appears calm  and comfortable  Respiratory system: Clear to auscultation. Respiratory effort normal. Cardiovascular system: irregular, no murmer.  Gastrointestinal system: Abdomen is nondistended, soft and nontender. No organomegaly or masses felt. Normal bowel sounds heard. Central nervous system: Alert and appears oriented, no focal deficits.  Extremities: bilateral varicosities.  Psychiatry: talkative, cheerful.    Data Reviewed: I have personally reviewed following labs and imaging studies  CBC:  Recent Labs Lab 09/27/15 1434 09/28/15 0155 09/29/15 0254  WBC 18.5* 19.8* 16.0*  NEUTROABS 15.7*  --   --   HGB 12.7 11.8* 11.5*  HCT 37.9 36.0 36.1    MCV 90.5 90.0 90.3  PLT 172 175 0000000   Basic Metabolic Panel:  Recent Labs Lab 09/27/15 1434 09/28/15 0545  NA 133* 137  K 4.1 3.8  CL 104 105  CO2 22 21*  GLUCOSE 204* 163*  BUN 48* 43*  CREATININE 1.25* 1.27*  CALCIUM 8.4* 8.2*   GFR: Estimated Creatinine Clearance: 29 mL/min (by C-G formula based on Cr of 1.27). Liver Function Tests:  Recent Labs Lab 09/27/15 1434  AST 22  ALT 20  ALKPHOS 80  BILITOT 0.8  PROT 6.0*  ALBUMIN 2.2*   No results for input(s): LIPASE, AMYLASE in the last 168 hours. No results for input(s): AMMONIA in the last 168 hours. Coagulation Profile: No results for input(s): INR, PROTIME in the last 168 hours. Cardiac Enzymes:  Recent Labs Lab 09/27/15 2013 09/28/15 0052 09/28/15 0553  TROPONINI 0.04* 0.03 0.05*   BNP (last 3 results) No results for input(s): PROBNP in the last 8760 hours. HbA1C:  Recent Labs  09/27/15 2100  HGBA1C 7.1*   CBG:  Recent Labs Lab 09/28/15 1221 09/28/15 1629 09/28/15 2113 09/29/15 0612 09/29/15 1124  GLUCAP 162* 138* 184* 152* 245*   Lipid Profile: No results for input(s): CHOL, HDL, LDLCALC, TRIG, CHOLHDL, LDLDIRECT in the last 72 hours. Thyroid Function Tests: No results for input(s): TSH, T4TOTAL, [name]4, T3FREE, THYROIDAB in the last 72 hours. Anemia Panel: No results for input(s): VITAMINB12, FOLATE, FERRITIN, TIBC, IRON, RETICCTPCT in the last 72 hours. Sepsis Labs:  Recent Labs Lab 09/27/15 1436  LATICACIDVEN 1.12    Recent Results (from the past 240 hour(s))  Urine culture     Status: Abnormal   Collection Time: 09/27/15  3:20 PM  Result Value Ref Range Status   Specimen Description URINE, CLEAN CATCH  Final   Special Requests NONE  Final   Culture >=100,000 COLONIES/mL ESCHERICHIA COLI (A)  Final   Report Status 09/29/2015 FINAL  Final   Organism ID, Bacteria ESCHERICHIA COLI (A)  Final      Susceptibility   Escherichia coli - MIC*    AMPICILLIN >=32 RESISTANT  Resistant     CEFAZOLIN <=4 SENSITIVE Sensitive     CEFTRIAXONE <=1 SENSITIVE Sensitive     CIPROFLOXACIN <=0.25 SENSITIVE Sensitive     GENTAMICIN <=1 SENSITIVE Sensitive     IMIPENEM <=0.25 SENSITIVE Sensitive     NITROFURANTOIN <=16 SENSITIVE Sensitive     TRIMETH/SULFA >=320 RESISTANT Resistant     AMPICILLIN/SULBACTAM 16 INTERMEDIATE Intermediate     PIP/TAZO <=4 SENSITIVE Sensitive     * >=100,000 COLONIES/mL ESCHERICHIA COLI  MRSA PCR Screening     Status: None   Collection Time: 09/27/15 10:29 PM  Result Value Ref Range Status   MRSA by PCR NEGATIVE NEGATIVE Final    Comment:        The GeneXpert MRSA Assay (FDA approved for NASAL specimens only), is  one component of a comprehensive MRSA colonization surveillance program. It is not intended to diagnose MRSA infection nor to guide or monitor treatment for MRSA infections.          Radiology Studies: Dg Chest 1 View  09/27/2015  CLINICAL DATA:  Mental status changes, bilateral hip pain after several recent falls. EXAM: CHEST 1 VIEW COMPARISON:  Chest x-ray dated 01/14/2009. FINDINGS: Median sternotomy wires appear intact and stable in alignment, with surgical changes of associated CABG. Heart is enlarged, possibly accentuated to some degree by the lordotic/supine patient positioning. Atherosclerotic calcifications noted at the aortic arch. Mild interstitial prominence, also possibly accentuated by the supine patient positioning. No confluent opacity to suggest a developing pneumonia. No pleural effusion or pneumothorax seen. Left lung base obscured by the enlarged heart. IMPRESSION: 1. Cardiomegaly. Mild interstitial prominence is probably accentuated by the supine patient positioning, possibly indicating a mild CHF/volume overload. No overt alveolar pulmonary edema. No evidence of pneumonia. 2. Aortic atherosclerosis. Electronically Signed   By: Franki Cabot M.D.   On: 09/27/2015 15:21   Ct Head Wo Contrast  09/27/2015   CLINICAL DATA:  Altered mental status and fall. Lethargy and slurred speech. EXAM: CT HEAD WITHOUT CONTRAST CT CERVICAL SPINE WITHOUT CONTRAST TECHNIQUE: Multidetector CT imaging of the head and cervical spine was performed following the standard protocol without intravenous contrast. Multiplanar CT image reconstructions of the cervical spine were also generated. COMPARISON:  Brain MRI dated 05/13/2010. FINDINGS: CT HEAD FINDINGS Again noted is generalized age related brain atrophy with commensurate dilatation of the ventricles and sulci. Mild chronic small vessel ischemic changes again noted within the bilateral periventricular and subcortical white matter regions. 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 There is no mass, hemorrhage, edema or other evidence of acute parenchymal abnormality. No extra-axial hemorrhage. No osseous abnormality. Mild mucosal thickening noted in the left frontal sinus, of uncertain age but likely chronic. Visualized paranasal sinuses otherwise clear. CT CERVICAL SPINE FINDINGS Degenerative changes throughout the cervical spine, moderate in degree with associated disc space narrowings and osseous spurring throughout. Combinations of disc-osteophytic bulges, uncovertebral joint hypertrophy and facet joint hypertrophy are causing mild to moderate central canal stenoses throughout with possible associated nerve root impingement. Slight retrolisthesis of C4 is likely related to the underlying degenerative changes. There is no fracture line or displaced fracture fragment identified. Facet joints are appropriately aligned throughout. Atherosclerotic calcifications noted at each carotid bulb region. Paravertebral soft tissues otherwise unremarkable. IMPRESSION: 1. No acute intracranial abnormality. No intracranial mass, hemorrhage or edema. Atrophy and chronic ischemic changes, as detailed above. 2. No fracture or acute subluxation  within the cervical spine. Chronic/degenerative findings detailed above. Electronically Signed   By: Franki Cabot M.D.   On: 09/27/2015 16:08   Ct Cervical Spine Wo Contrast  09/27/2015  CLINICAL DATA:  Altered mental status and fall. Lethargy and slurred speech. EXAM: CT HEAD WITHOUT CONTRAST CT CERVICAL SPINE WITHOUT CONTRAST TECHNIQUE: Multidetector CT imaging of the head and cervical spine was performed following the standard protocol without intravenous contrast. Multiplanar CT image reconstructions of the cervical spine were also generated. COMPARISON:  Brain MRI dated 05/13/2010. FINDINGS: CT HEAD FINDINGS Again noted is generalized age related brain atrophy with commensurate dilatation of the ventricles and sulci. Mild chronic small vessel ischemic changes again noted within the bilateral periventricular and subcortical white matter regions. 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 There is no  mass, hemorrhage, edema or other evidence of acute parenchymal abnormality. No extra-axial hemorrhage. No osseous abnormality. Mild mucosal thickening noted in the left frontal sinus, of uncertain age but likely chronic. Visualized paranasal sinuses otherwise clear. CT CERVICAL SPINE FINDINGS Degenerative changes throughout the cervical spine, moderate in degree with associated disc space narrowings and osseous spurring throughout. Combinations of disc-osteophytic bulges, uncovertebral joint hypertrophy and facet joint hypertrophy are causing mild to moderate central canal stenoses throughout with possible associated nerve root impingement. Slight retrolisthesis of C4 is likely related to the underlying degenerative changes. There is no fracture line or displaced fracture fragment identified. Facet joints are appropriately aligned throughout. Atherosclerotic calcifications noted at each carotid bulb region. Paravertebral soft tissues otherwise unremarkable.  IMPRESSION: 1. No acute intracranial abnormality. No intracranial mass, hemorrhage or edema. Atrophy and chronic ischemic changes, as detailed above. 2. No fracture or acute subluxation within the cervical spine. Chronic/degenerative findings detailed above. Electronically Signed   By: Franki Cabot M.D.   On: 09/27/2015 16:08   Dg Hips Bilat With Pelvis 3-4 Views  09/27/2015  CLINICAL DATA:  Mental status changes, recent falls with bilateral hip pain. EXAM: DG HIP (WITH OR WITHOUT PELVIS) 3-4V BILAT COMPARISON:  None. FINDINGS: Single view of the pelvis and two views of each hip are provided. Osseous alignment is normal. There is osteopenia which limits characterization of osseous detail, however, there is no fracture line or displaced fracture fragment identified. Mild degenerative change noted at each hip and within the scoliotic lower lumbar spine. Additional degenerative changes/scleroses seen at the sacroiliac joint spaces. Calcified atherosclerotic changes noted within soft tissues about the pelvis and proximal femurs. Soft tissues about the pelvis and hips are otherwise unremarkable. IMPRESSION: No acute findings. No osseous fracture or dislocation. Osteopenia. Mild degenerative change. Atherosclerotic calcifications. Electronically Signed   By: Franki Cabot M.D.   On: 09/27/2015 15:18        Scheduled Meds: . antiseptic oral rinse  7 mL Mouth Rinse BID  . aspirin EC  81 mg Oral Daily  . cephALEXin  500 mg Oral Q12H  . ferrous sulfate  325 mg Oral Q breakfast  . furosemide  20 mg Oral Daily  . insulin aspart  0-15 Units Subcutaneous TID WC  . insulin aspart  0-5 Units Subcutaneous QHS  . metoprolol tartrate  25 mg Oral BID  . pantoprazole  40 mg Oral Daily  . pravastatin  20 mg Oral QHS  . sodium chloride flush  3 mL Intravenous Q12H  . traZODone  150 mg Oral QHS   Continuous Infusions: . heparin 1,350 Units/hr (09/28/15 2239)     LOS: 2 days    Time spent: 35 minutes.      Hosie Poisson, MD [facility] Pager [phone]  If 7PM-7AM, please contact night-coverage [email or web address] Password [email or web address] 09/29/2015, 2:40 PM

## 2015-09-29 NOTE — Progress Notes (Addendum)
Occupational Therapy Evaluation Patient Details Name: [name] MRN: [id number] DOB: 1927-04-08 Today's Date: 09/29/2015    History of Present Illness 82 y.o. female admitted for AMS, fever, chills, general malaise, and UTI. PMH significant for HTN, HLD, CAD, CABG (1996), afib, dysphagia, diabetic polyneuropathy, DM type II, PUD, DVT of RLE.   Clinical Impression   PTA, pt required min assist for LB ADLs and used RW for short distance mobility. Pt is currently very deconditioned and presents with generalized weakness and fatigue. Pt required max assist for LB ADLs and mod assist for transfers. SpO2 on 2L [place] at 93-94% at rest; SpO2 at 93% on RA during activity for 5-10 minutes. Pt with episode of tachycardia while transferring to chair with HR 115-120. Pt plans to d/c home and is currently refusing SNF for post-acute rehab. 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 Will continue to follow acutely to address OT needs and goals.     Follow Up Recommendations  Home health OT;Supervision/Assistance - 24 hour    Equipment Recommendations  None recommended by OT    Recommendations for Other Services PT consult     Precautions / Restrictions Precautions Precautions: Fall Restrictions Weight Bearing Restrictions: No      Mobility Bed Mobility Overal bed mobility: Needs Assistance Bed Mobility: Supine to Sit     Supine to sit: Mod assist;HOB elevated     General bed mobility comments: HOB elevated, use of bedrails. Assist to move BLE off bed, to pivot and scoot hips EOB, and for trunk support to come to sitting position.  Transfers Overall transfer level: Needs assistance Equipment used: 1 person hand held assist Transfers: Stand Pivot Transfers   Stand pivot transfers: Mod assist       General transfer comment: Mod assist for boost to stand, to stabilize and maintain balance throughout stand-pivot  transfer.     Balance Overall balance assessment: Needs assistance Sitting-balance support: No upper extremity supported;Feet supported Sitting balance-Leahy Scale: Fair     Standing balance support: Bilateral upper extremity supported;During functional activity Standing balance-Leahy Scale: Poor                              ADL Overall ADL's : Needs assistance/impaired Eating/Feeding: Set up;Sitting   Grooming: Wash/dry hands;Wash/dry face;Set up;Sitting   Upper Body Bathing: Set up;Sitting   Lower Body Bathing: Maximal assistance;Sit to/from stand   Upper Body Dressing : Set up;Sitting   Lower Body Dressing: Maximal assistance;Sit to/from stand   Toilet Transfer: Moderate assistance;Stand-pivot;Cueing for safety Toilet Transfer Details (indicate cue type and reason): simulated to chair, handheld assist Toileting- Clothing Manipulation and Hygiene: Maximal assistance;Sit to/from stand       Functional mobility during ADLs: Moderate assistance General ADL Comments: Pt very deconditioned and fatigues quickly.     Vision Vision Assessment?: No apparent visual deficits   Perception     Praxis      Pertinent Vitals/Pain Pain Assessment: No/denies pain     Hand Dominance Right   Extremity/Trunk Assessment Upper Extremity Assessment Upper Extremity Assessment: Generalized weakness   Lower Extremity Assessment Lower Extremity Assessment: Generalized weakness   Cervical / Trunk Assessment Cervical / Trunk Assessment: Kyphotic   Communication Communication Communication: No difficulties   Cognition Arousal/Alertness: Awake/alert Behavior During Therapy: WFL for tasks assessed/performed Overall Cognitive Status: Within Functional Limits for tasks assessed  General Comments  On arrival, pt on 2L of supplmental O2 with SpO2 at 93-94% at rest. Removed  and pt's O2 remained at 93% on RA during activity for 5-10 minutes. Pt  with episode of tachycardia while transferring to chair with HR 115-120, but was asymptomatic.    Exercises       Shoulder Instructions      Home Living Family/patient expects to be discharged to:: Private residence Living Arrangements: Spouse/significant other Available Help at Discharge: Friend(s);Available 24 hours/day (supervision only) Type of Home: House Home Access: Stairs to enter [employer] of Steps: 7 Entrance Stairs-Rails: Right;Left Home Layout: One level     Bathroom Shower/Tub: Other (comment) (only sponge bathes at sink)   Biochemist, clinical: Standard     Home Equipment: Environmental consultant - 2 wheels;Walker - 4 wheels;Cane - single point;Bedside commode          Prior Functioning/Environment Level of Independence: Needs assistance  Gait / Transfers Assistance Needed: Uses RW or rollator for short distance walking only ADL's / Homemaking Assistance Needed: Assist to don/doff compression socks, assist for mobility during ADLs. Does all cooking, cleaning, laundry, grocery shopping.   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    OT Diagnosis: Generalized weakness   OT Problem List: Decreased strength;Decreased activity tolerance;Impaired balance (sitting and/or standing);Decreased safety awareness;Decreased knowledge of precautions;Decreased knowledge of use of DME or AE;Cardiopulmonary status limiting activity   OT Treatment/Interventions: Self-care/ADL training;Therapeutic exercise;Energy conservation;DME and/or AE instruction;Therapeutic activities;Patient/family education;Balance training    OT Goals(Current goals can be found in the care plan section) Acute Rehab OT Goals Patient Stated Goal: to feel better and get back home and back to work OT Goal Formulation: With patient Time For Goal Achievement: 10/13/15 Potential to Achieve Goals: Good ADL Goals Pt Will Perform  Grooming: with modified independence;sitting Pt Will Perform Lower Body Bathing: with supervision;sit to/from stand Pt Will Perform Lower Body Dressing: with supervision;sit to/from stand Pt Will Transfer to Toilet: with supervision;ambulating;bedside commode (over toilet) Pt Will Perform Toileting - Clothing Manipulation and hygiene: with supervision;sitting/lateral leans;sit to/from stand Additional ADL Goal #1: Pt will utilize 2 energy conservation strategies during ADL tasks with min verbal cues.  OT Frequency: Min 3X/week   Barriers to D/C: Decreased caregiver support  Pt's boyfriend and roommate cannot provide physical assistance due to medical issues of their own       Co-evaluation              End of Session Equipment Utilized During Treatment: Gait belt;Oxygen Nurse Communication: Mobility status  Activity Tolerance: Patient limited by fatigue Patient left: in chair;with call bell/phone within reach;with chair alarm set   Time: [id number] OT Time Calculation (min): 35 min Charges:  OT General Charges $OT Visit: 1 Procedure OT Evaluation $OT Eval Moderate Complexity: 1 Procedure OT Treatments $Self Care/Home Management : 8-22 mins G-Codes:    Redmond Baseman, OTR/L Pager: [phone] 09/29/2015, 10:04 AM

## 2015-09-29 NOTE — Progress Notes (Signed)
ANTICOAGULATION CONSULT NOTE - Follow Up Consult  Pharmacy Consult for heparin Indication: afib; r/o VTE  Allergies  Allergen Reactions  . Amoxicillin-Pot Clavulanate Other (See Comments)    Reaction:  Unknown   . Metformin And Related Diarrhea    Patient Measurements: Height: 5\' 2"  (157.5 cm) Weight: 153 lb (69.4 kg) IBW/kg (Calculated) : 50.1 Heparin Dosing Weight: 70 kg  Vital Signs: Temp: 98.3 F (36.8 C) (06/27 0500) Temp Source: Oral (06/27 0500) BP: 123/64 mmHg (06/27 0500)  Labs:  Recent Labs  09/27/15 1434 09/27/15 2013 09/28/15 0052  09/28/15 0155 09/28/15 0545 09/28/15 0553 09/28/15 1157 09/28/15 2157 09/29/15 0254 09/29/15 0718  HGB 12.7  --   --   --  11.8*  --   --   --   --  11.5*  --   HCT 37.9  --   --   --  36.0  --   --   --   --  36.1  --   PLT 172  --   --   --  175  --   --   --   --  193  --   HEPARINUNFRC  --   --   --   < > <0.10*  --   --  0.15* 0.27*  --  0.40  CREATININE 1.25*  --   --   --   --  1.27*  --   --   --   --   --   TROPONINI  --  0.04* 0.03  --   --   --  0.05*  --   --   --   --   < > = values in this interval not displayed.  Estimated Creatinine Clearance: 29 mL/min (by C-G formula based on Cr of 1.27).   Medications:  Scheduled:  . antiseptic oral rinse  7 mL Mouth Rinse BID  . aspirin EC  81 mg Oral Daily  . cefTRIAXone (ROCEPHIN)  IV  1 g Intravenous Q24H  . ferrous sulfate  325 mg Oral Q breakfast  . furosemide  20 mg Intravenous BID  . insulin aspart  0-15 Units Subcutaneous TID WC  . insulin aspart  0-5 Units Subcutaneous QHS  . metoprolol tartrate  12.5 mg Oral BID  . pantoprazole  40 mg Oral Daily  . pravastatin  20 mg Oral QHS  . sodium chloride flush  3 mL Intravenous Q12H  . traZODone  150 mg Oral QHS   Infusions:  . diltiazem (CARDIZEM) infusion Stopped (09/28/15 0315)  . heparin 1,350 Units/hr (09/28/15 2239)    Assessment: Anticoagulation: Heparin for Afib + r/o DVT, mult falls and hx DVT -  HL 0.4 now in goal range. Hgb 11.5 (baseline 12.7) Plts stable  Goal of Therapy:  Heparin level 0.3-0.7 units/ml Monitor platelets by anticoagulation protocol: Yes   Plan:  - Continue heparin gtt at 1250 units/hr - Confirm therapeutic level in 6 hrs. - Daily HL / CBC - f/u start of oral anticoagulation   Gianmarco Roye S. Alford Highland, PharmD, BCPS Clinical Staff Pharmacist Pager [phone]  Eilene Ghazi Stillinger 09/29/2015,8:49 AM

## 2015-09-29 NOTE — Evaluation (Signed)
Physical Therapy Evaluation Patient Details Name: [name] MRN: [id number] DOB: [birth year] Today's Date: 09/29/2015   History of Present Illness  82 y.o. female admitted for AMS, fever, chills, general malaise, and UTI. Pt developed afib with RVR. PMH significant for HTN, HLD, CAD, CABG (1996), afib, dysphagia, diabetic polyneuropathy, DM type II, PUD, DVT of RLE.  Clinical Impression  Pt admitted with above diagnosis and presents to PT with functional limitations due to deficits listed below (See PT problem list). Pt needs skilled PT to maximize independence and safety to allow discharge to home (pt adamantly refusing SNF).      Follow Up Recommendations Home health PT;Supervision/Assistance - 24 hour (pt adamantly refusing SNF.)    Equipment Recommendations  None recommended by PT    Recommendations for Other Services       Precautions / Restrictions Precautions Precautions: Fall Restrictions Weight Bearing Restrictions: No      Mobility  Bed Mobility Overal bed mobility: Needs Assistance Bed Mobility: Supine to Sit     Supine to sit: Mod assist;HOB elevated     General bed mobility comments: Pt up in chair.  Transfers Overall transfer level: Needs assistance Equipment used: 1 person hand held assist;4-wheeled walker Transfers: Sit to/from Stand Sit to Stand: Mod assist;+2 physical assistance Stand pivot transfers: Mod assist       General transfer comment: Assist to bring hips up from low recliner and low commode. Pt unable to flex knees enough to get feet underneath her.  Ambulation/Gait Ambulation/Gait assistance: Min assist Ambulation Distance (Feet): 75 Feet Assistive device: 4-wheeled walker Gait Pattern/deviations: Step-through pattern;Decreased step length - right;Decreased step length - left;Shuffle;Trunk flexed Gait velocity: decr Gait velocity interpretation: <1.8 ft/sec, indicative of risk for recurrent falls General Gait Details: Assist  for balance. Verbal cues to maintain grip on walker as pt at times grabbing wall rail with one hand and walker with other hand. HR 160's with amb. SpO2 95% on RA. Replaced O2 and HR back to 130 with seated rest.  Stairs            Wheelchair Mobility    Modified Rankin (Stroke Patients Only)       Balance Overall balance assessment: Needs assistance;History of Falls Sitting-balance support: No upper extremity supported;Feet supported Sitting balance-Leahy Scale: Fair     Standing balance support: Bilateral upper extremity supported Standing balance-Leahy Scale: Poor Standing balance comment: rollator and min guard for static standing                             Pertinent Vitals/Pain Pain Assessment: No/denies pain    Home Living Family/patient expects to be discharged to:: Private residence Living Arrangements: Spouse/significant other Available Help at Discharge: Friend(s);Available 24 hours/day (supervision only) Type of Home: House Home Access: Stairs to enter Entrance Stairs-Rails: Psychiatric nurse of Steps: 7 Home Layout: One level Home Equipment: Walker - 2 wheels;Walker - 4 wheels;Cane - single point;Bedside commode Additional Comments: lift chair    Prior Function Level of Independence: Needs assistance   Gait / Transfers Assistance Needed: Uses RW or rollator for short distance walking only. Uses lift chair to get up or someone helps pull her up.  ADL's / Homemaking Assistance Needed: Assist to don/doff compression socks, assist for mobility during ADLs. Does all cooking, cleaning, laundry, grocery shopping.  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  Hand Dominance   Dominant Hand: Right    Extremity/Trunk Assessment   Upper Extremity Assessment: Defer to OT evaluation           Lower Extremity Assessment: Generalized  weakness;RLE deficits/detail;LLE deficits/detail RLE Deficits / Details: Decr knee ROM with knees only ~80 degrees flexion LLE Deficits / Details: Decr knee ROM with knees only ~80 degrees flexion  Cervical / Trunk Assessment: Kyphotic  Communication   Communication: No difficulties  Cognition Arousal/Alertness: Awake/alert Behavior During Therapy: WFL for tasks assessed/performed Overall Cognitive Status: Within Functional Limits for tasks assessed                      General Comments General comments (skin integrity, edema, etc.): On arrival, pt on 2L of supplmental O2 with SpO2 at 93-94% at rest. Removed [place] and pt's O2 remained at 93% on RA during activity for 5 minutes. Pt with episode of tachycardia while transferring to chair with HR 115-120.    Exercises        Assessment/Plan    PT Assessment Patient needs continued PT services  PT Diagnosis Difficulty walking;Generalized weakness   PT Problem List Decreased strength;Decreased activity tolerance;Decreased balance;Decreased mobility;Decreased safety awareness;Decreased knowledge of precautions  PT Treatment Interventions DME instruction;Gait training;Stair training;Functional mobility training;Therapeutic activities;Balance training;Therapeutic exercise;Patient/family education   PT Goals (Current goals can be found in the Care Plan section) Acute Rehab PT Goals Patient Stated Goal: to feel better and get back home and back to work PT Goal Formulation: With patient Time For Goal Achievement: 10/06/15 Potential to Achieve Goals: Good    Frequency Min 3X/week   Barriers to discharge Decreased caregiver support People in house also elderly and she cares for them     Co-evaluation               End of Session Equipment Utilized During Treatment: Gait belt Activity Tolerance: Patient limited by fatigue;Treatment limited secondary to medical complications (Comment) (HR to 160's with amb) Patient left: in  chair;with call bell/phone within reach Nurse Communication: Mobility status         Time: 0940-1008 PT Time Calculation (min) (ACUTE ONLY): 28 min   Charges:   PT Evaluation $PT Eval Moderate Complexity: 1 Procedure PT Treatments $Gait Training: 8-22 mins   PT G Codes:        Oyinkansola Truax 10/08/15, 11:19 AM [facility] PT [phone]

## 2015-09-29 NOTE — Care Management Important Message (Signed)
Important Message  Patient Details  Name: [name] MRN: [id number] Date of Birth: 03-29-1928   Medicare Important Message Given:  Yes    Lashona Schaaf [place] 09/29/2015, 11:22 AM

## 2015-09-30 DIAGNOSIS — M79604 Pain in right leg: Secondary | ICD-10-CM

## 2015-09-30 DIAGNOSIS — E785 Hyperlipidemia, unspecified: Secondary | ICD-10-CM

## 2015-09-30 DIAGNOSIS — M79605 Pain in left leg: Secondary | ICD-10-CM

## 2015-09-30 DIAGNOSIS — N390 Urinary tract infection, site not specified: Secondary | ICD-10-CM

## 2015-09-30 DIAGNOSIS — I4891 Unspecified atrial fibrillation: Secondary | ICD-10-CM

## 2015-09-30 LAB — BASIC METABOLIC PANEL
Anion gap: 9 (ref 5–15)
BUN: 32 mg/dL — AB (ref 6–20)
CALCIUM: 8.2 mg/dL — AB (ref 8.9–10.3)
CHLORIDE: 101 mmol/L (ref 101–111)
CO2: 26 mmol/L (ref 22–32)
CREATININE: 0.9 mg/dL (ref 0.44–1.00)
GFR calc Af Amer: >60 mL/min (ref >60)
GFR calc non Af Amer: 56 mL/min — ABNORMAL LOW (ref >60)
GLUCOSE: 160 mg/dL — AB (ref 65–99)
Potassium: 3.6 mmol/L (ref 3.5–5.1)
Sodium: 136 mmol/L (ref 135–145)

## 2015-09-30 LAB — GLUCOSE, CAPILLARY
GLUCOSE-CAPILLARY: 205 mg/dL — AB (ref 65–99)
GLUCOSE-CAPILLARY: 134 mg/dL — AB (ref 65–99)
Glucose-Capillary: 183 mg/dL — ABNORMAL HIGH (ref 65–99)
Glucose-Capillary: 191 mg/dL — ABNORMAL HIGH (ref 65–99)

## 2015-09-30 MED ORDER — METOPROLOL TARTRATE 25 MG PO TABS
25.0000 mg | ORAL_TABLET | Freq: Once | ORAL | Status: AC
  Administered 2015-09-30: 25 mg via ORAL
  Filled 2015-09-30: qty 1

## 2015-09-30 MED ORDER — METOPROLOL TARTRATE 25 MG PO TABS
37.5000 mg | ORAL_TABLET | Freq: Two times a day (BID) | ORAL | Status: DC
  Administered 2015-09-30 – 2015-10-05 (×10): 37.5 mg via ORAL
  Filled 2015-09-30 (×11): qty 1

## 2015-09-30 NOTE — Progress Notes (Signed)
PROGRESS NOTE  [name]  [id number] DOB: 14-May-1927  DOA: 09/27/2015 PCP: Ria Bush, MD   Brief Narrative:  82 year old female, from home, PMH of HTN, CAD, CABG, chronic diastolic CHF, HLD, DM 2, PAD presented to [facility] ED on 09/27/15 with reported fever, chills, generalized weakness, altered mental status, dry cough, dyspnea and multiple falls. In ED and noted to be in A. fib with RVR, CT head and neck unremarkable. Admitted for presumed UTI many A. fib with RVR.   Assessment & Plan:   Active Problems:   Coronary artery disease   Anxiety   Hypertension   Diastolic dysfunction   Hyperlipidemia   Peptic ulcer disease   Dysphagia   Diabetic polyneuropathy (HCC)   Venous ulcer of leg (HCC)   Diabetes mellitus type 2 with peripheral artery disease (HCC)   Chronic venous insufficiency   Malnutrition of moderate degree   Acute respiratory failure (HCC)   Acute on chronic diastolic heart failure (HCC)   Persistent atrial fibrillation ([place])   New onset A. fib with RVR - Cardiology following. - Initially treated with Cardizem drip and IV heparin. Now off of both. - She had reverted to sinus rhythm but has gone back to A. fib with RVR in the 120s-130s on 6/28. Currently on metoprolol 25 MG twice a day and received extra dose of metoprolol 25 MG times one. Cardiology deciding regarding further course - Continue aspirin. As per prior records, not a good candidate for systemic anticoagulation due to fall risk.  Acute on chronic diastolic CHF - Precipitated by A. fib with RVR. - Echo 09/28/15: Normal EF and moderate LVH. - Diuresed and now compensated. Continue home dose of Lasix.  Elevated troponin - Per cardiology. Felt to be related to demand ischemia.  CAD status post CABG - No reported chest pain.  DM 2 - A1c: 7.1. Continue SSI. Fluctuating.  Hyperlipidemia - Continue Pravachol  PAD/bilateral varicosities and bilateral resting leg pain - S/p ablation of the  left greater saphenous vein for painful varicosities two years ago.  - Vascular were consulted on admission by EDP, who recommended getting ABI and to call them if needed.  - ABI suggests bilateral moderate arterial insufficiency at rest. Will consult vascular surgery.  Escherichia coli UTI - Initially treated with IV Rocephin now changed to oral Keflex.  Acute encephalopathy - Likely related to acute illness including UTI. Seems to have improved. Unclear if back at baseline.  Anemia - Stable  Acute kidney injury - Resolved.   DVT prophylaxis: Subcutaneous heparin Code Status: Full Family Communication: No family at bedside. Discussed with patient. Disposition Plan: DC home when medically stable.   Consultants:    Cardiology  vascular surgery  Procedures:    None  Antimicrobials:    IV Rocephin 6/25 > 6/26  oral Keflex 6/27 >   Subjective: Complains of bilateral thigh area pain. Denies pain elsewhere. No chest pain, cough, dyspnea or palpitations reported.  Objective:  Filed Vitals:   09/29/15 2016 09/29/15 2106 09/30/15 0405 09/30/15 1030  BP: 119/63 135/55 104/53 108/78  Pulse: 114 71 93 120  Temp: 99 F (37.2 C) 97.7 F (36.5 C) 98.4 F (36.9 C)   TempSrc: Oral Oral Oral   Resp: 18 18 18    Height:      Weight:   65.8 kg (145 lb 1 oz)   SpO2: 99% 100% 98%     Intake/Output Summary (Last 24 hours) at 09/30/15 1430 Last data filed at 09/30/15 1000  Gross per 24 hour  Intake    720 ml  Output   1576 ml  Net   -856 ml   Filed Weights   09/27/15 2005 09/29/15 0500 09/30/15 0405  Weight: 69 kg (152 lb 1.9 oz) 69.4 kg (153 lb) 65.8 kg (145 lb 1 oz)    Examination:  General exam: Pleasant elderly female sitting up comfortably in chair this morning. Respiratory system: Clear to auscultation. Respiratory effort normal. Cardiovascular system: S1 & S2 heard,  irregularly irregular and tachycardic. No JVD, murmurs, rubs, gallops or clicks. No pedal  edema. Telemetry: A. fib with RVR fluctuating in the 110s-130s.  Gastrointestinal system: Abdomen is nondistended, soft and nontender. No organomegaly or masses felt. Normal bowel sounds heard. Central nervous system: Alert and oriented. No focal neurological deficits. Extremities: Symmetric 5 x 5 power. Diminished bilateral dorsalis pedis pulsations.  Skin: No rashes, lesions or ulcers Psychiatry: Judgement and insight appear impaired. Mood & affect appropriate.     Data Reviewed: I have personally reviewed following labs and imaging studies  CBC:  Recent Labs Lab 09/27/15 1434 09/28/15 0155 09/29/15 0254  WBC 18.5* 19.8* 16.0*  NEUTROABS 15.7*  --   --   HGB 12.7 11.8* 11.5*  HCT 37.9 36.0 36.1  MCV 90.5 90.0 90.3  PLT 172 175 0000000   Basic Metabolic Panel:  Recent Labs Lab 09/27/15 1434 09/28/15 0545 09/30/15 0330  NA 133* 137 136  K 4.1 3.8 3.6  CL 104 105 101  CO2 22 21* 26  GLUCOSE 204* 163* 160*  BUN 48* 43* 32*  CREATININE 1.25* 1.27* 0.90  CALCIUM 8.4* 8.2* 8.2*   GFR: Estimated Creatinine Clearance: 40 mL/min (by C-G formula based on Cr of 0.9). Liver Function Tests:  Recent Labs Lab 09/27/15 1434  AST 22  ALT 20  ALKPHOS 80  BILITOT 0.8  PROT 6.0*  ALBUMIN 2.2*   No results for input(s): LIPASE, AMYLASE in the last 168 hours. No results for input(s): AMMONIA in the last 168 hours. Coagulation Profile: No results for input(s): INR, PROTIME in the last 168 hours. Cardiac Enzymes:  Recent Labs Lab 09/27/15 2013 09/28/15 0052 09/28/15 0553  TROPONINI 0.04* 0.03 0.05*   BNP (last 3 results) No results for input(s): PROBNP in the last 8760 hours. HbA1C:  Recent Labs  09/27/15 2100  HGBA1C 7.1*   CBG:  Recent Labs Lab 09/29/15 1124 09/29/15 1642 09/29/15 2053 09/30/15 0621 09/30/15 1126  GLUCAP 245* 105* 169* 183* 205*   Lipid Profile: No results for input(s): CHOL, HDL, LDLCALC, TRIG, CHOLHDL, LDLDIRECT in the last 72  hours. Thyroid Function Tests: No results for input(s): TSH, T4TOTAL, [name]4, T3FREE, THYROIDAB in the last 72 hours. Anemia Panel: No results for input(s): VITAMINB12, FOLATE, FERRITIN, TIBC, IRON, RETICCTPCT in the last 72 hours.  Sepsis Labs:  Recent Labs Lab 09/27/15 1436  LATICACIDVEN 1.12    Recent Results (from the past 240 hour(s))  Urine culture     Status: Abnormal   Collection Time: 09/27/15  3:20 PM  Result Value Ref Range Status   Specimen Description URINE, CLEAN CATCH  Final   Special Requests NONE  Final   Culture >=100,000 COLONIES/mL ESCHERICHIA COLI (A)  Final   Report Status 09/29/2015 FINAL  Final   Organism ID, Bacteria ESCHERICHIA COLI (A)  Final      Susceptibility   Escherichia coli - MIC*    AMPICILLIN >=32 RESISTANT Resistant     CEFAZOLIN <=4 SENSITIVE Sensitive  CEFTRIAXONE <=1 SENSITIVE Sensitive     CIPROFLOXACIN <=0.25 SENSITIVE Sensitive     GENTAMICIN <=1 SENSITIVE Sensitive     IMIPENEM <=0.25 SENSITIVE Sensitive     NITROFURANTOIN <=16 SENSITIVE Sensitive     TRIMETH/SULFA >=320 RESISTANT Resistant     AMPICILLIN/SULBACTAM 16 INTERMEDIATE Intermediate     PIP/TAZO <=4 SENSITIVE Sensitive     * >=100,000 COLONIES/mL ESCHERICHIA COLI  MRSA PCR Screening     Status: None   Collection Time: 09/27/15 10:29 PM  Result Value Ref Range Status   MRSA by PCR NEGATIVE NEGATIVE Final    Comment:        The GeneXpert MRSA Assay (FDA approved for NASAL specimens only), is one component of a comprehensive MRSA colonization surveillance program. It is not intended to diagnose MRSA infection nor to guide or monitor treatment for MRSA infections.          Radiology Studies: No results found.      Scheduled Meds: . antiseptic oral rinse  7 mL Mouth Rinse BID  . aspirin EC  81 mg Oral Daily  . cephALEXin  500 mg Oral Q12H  . ferrous sulfate  325 mg Oral Q breakfast  . furosemide  20 mg Oral Daily  . heparin subcutaneous  5,000  Units Subcutaneous Q8H  . insulin aspart  0-15 Units Subcutaneous TID WC  . insulin aspart  0-5 Units Subcutaneous QHS  . metoprolol tartrate  25 mg Oral BID  . pantoprazole  40 mg Oral Daily  . pravastatin  20 mg Oral QHS  . sodium chloride flush  3 mL Intravenous Q12H  . traZODone  150 mg Oral QHS   Continuous Infusions:    LOS: 3 days    Time spent: 40 minutes.    [facility], MD [facility] Pager [phone]  If 7PM-7AM, please contact night-coverage [email or web address] Password [email or web address] 09/30/2015, 2:30 PM

## 2015-09-30 NOTE — Progress Notes (Signed)
Patient Name: [name] Date of Encounter: 09/30/2015   SUBJECTIVE  No chest pain or sob. Complains of leg pain.   CURRENT MEDS . antiseptic oral rinse  7 mL Mouth Rinse BID  . aspirin EC  81 mg Oral Daily  . cephALEXin  500 mg Oral Q12H  . ferrous sulfate  325 mg Oral Q breakfast  . furosemide  20 mg Oral Daily  . heparin subcutaneous  5,000 Units Subcutaneous Q8H  . insulin aspart  0-15 Units Subcutaneous TID WC  . insulin aspart  0-5 Units Subcutaneous QHS  . metoprolol tartrate  25 mg Oral BID  . pantoprazole  40 mg Oral Daily  . pravastatin  20 mg Oral QHS  . sodium chloride flush  3 mL Intravenous Q12H  . traZODone  150 mg Oral QHS    OBJECTIVE  Filed Vitals:   09/29/15 2016 09/29/15 2106 09/30/15 0405 09/30/15 1030  BP: 119/63 135/55 104/53 108/78  Pulse: 114 71 93 120  Temp: 99 F (37.2 C) 97.7 F (36.5 C) 98.4 F (36.9 C)   TempSrc: Oral Oral Oral   Resp: 18 18 18    Height:      Weight:   145 lb 1 oz (65.8 kg)   SpO2: 99% 100% 98%     Intake/Output Summary (Last 24 hours) at 09/30/15 1149 Last data filed at 09/30/15 1000  Gross per 24 hour  Intake    840 ml  Output   2276 ml  Net  -1436 ml   Filed Weights   09/27/15 2005 09/29/15 0500 09/30/15 0405  Weight: 152 lb 1.9 oz (69 kg) 153 lb (69.4 kg) 145 lb 1 oz (65.8 kg)    PHYSICAL EXAM  General: Pleasant, NAD. Neuro: Alert and oriented X 3. Moves all extremities spontaneously. Psych: Normal affect. HEENT:  Normal  Neck: Supple without bruits or JVD. Lungs:  Resp regular and unlabored, CTA. Heart: Irregular  no s3, s4, or murmurs. Abdomen: Soft, non-tender, non-distended, BS + x 4.  Extremities: No clubbing, cyanosis or edema. DP/PT/Radials 2+ and equal bilaterally. varicose vein.   Accessory Clinical Findings  CBC  Recent Labs  09/27/15 1434 09/28/15 0155 09/29/15 0254  WBC 18.5* 19.8* 16.0*  NEUTROABS 15.7*  --   --   HGB 12.7 11.8* 11.5*  HCT 37.9 36.0 36.1  MCV 90.5  90.0 90.3  PLT 172 175 0000000   Basic Metabolic Panel  Recent Labs  09/28/15 0545 09/30/15 0330  NA 137 136  K 3.8 3.6  CL 105 101  CO2 21* 26  GLUCOSE 163* 160*  BUN 43* 32*  CREATININE 1.27* 0.90  CALCIUM 8.2* 8.2*   Liver Function Tests  Recent Labs  09/27/15 1434  AST 22  ALT 20  ALKPHOS 80  BILITOT 0.8  PROT 6.0*  ALBUMIN 2.2*   No results for input(s): LIPASE, AMYLASE in the last 72 hours. Cardiac Enzymes  Recent Labs  09/27/15 2013 09/28/15 0052 09/28/15 0553  TROPONINI 0.04* 0.03 0.05*   BNP Invalid input(s): POCBNP D-Dimer No results for input(s): DDIMER in the last 72 hours. Hemoglobin A1C  Recent Labs  09/27/15 2100  HGBA1C 7.1*   Fasting Lipid Panel No results for input(s): CHOL, HDL, LDLCALC, TRIG, CHOLHDL, LDLDIRECT in the last 72 hours. Thyroid Function Tests No results for input(s): TSH, T4TOTAL, T3FREE, THYROIDAB in the last 72 hours.  Invalid input(s): [name]3  TELE  afib at rate of 110-120s  Radiology/Studies  Dg Chest 1 View  09/27/2015  CLINICAL DATA:  Mental status changes, bilateral hip pain after several recent falls. EXAM: CHEST 1 VIEW COMPARISON:  Chest x-ray dated 01/14/2009. FINDINGS: Median sternotomy wires appear intact and stable in alignment, with surgical changes of associated CABG. Heart is enlarged, possibly accentuated to some degree by the lordotic/supine patient positioning. Atherosclerotic calcifications noted at the aortic arch. Mild interstitial prominence, also possibly accentuated by the supine patient positioning. No confluent opacity to suggest a developing pneumonia. No pleural effusion or pneumothorax seen. Left lung base obscured by the enlarged heart. IMPRESSION: 1. Cardiomegaly. Mild interstitial prominence is probably accentuated by the supine patient positioning, possibly indicating a mild CHF/volume overload. No overt alveolar pulmonary edema. No evidence of pneumonia. 2. Aortic atherosclerosis.  Electronically Signed   By: Franki Cabot M.D.   On: 09/27/2015 15:21   Ct Head Wo Contrast  09/27/2015  CLINICAL DATA:  Altered mental status and fall. Lethargy and slurred speech. EXAM: CT HEAD WITHOUT CONTRAST CT CERVICAL SPINE WITHOUT CONTRAST TECHNIQUE: Multidetector CT imaging of the head and cervical spine was performed following the standard protocol without intravenous contrast. Multiplanar CT image reconstructions of the cervical spine were also generated. COMPARISON:  Brain MRI dated 05/13/2010. FINDINGS: CT HEAD FINDINGS Again noted is generalized age related brain atrophy with commensurate dilatation of the ventricles and sulci. Mild chronic small vessel ischemic changes again noted within the bilateral periventricular and subcortical white matter regions. 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 There is no mass, hemorrhage, edema or other evidence of acute parenchymal abnormality. No extra-axial hemorrhage. No osseous abnormality. Mild mucosal thickening noted in the left frontal sinus, of uncertain age but likely chronic. Visualized paranasal sinuses otherwise clear. CT CERVICAL SPINE FINDINGS Degenerative changes throughout the cervical spine, moderate in degree with associated disc space narrowings and osseous spurring throughout. Combinations of disc-osteophytic bulges, uncovertebral joint hypertrophy and facet joint hypertrophy are causing mild to moderate central canal stenoses throughout with possible associated nerve root impingement. Slight retrolisthesis of C4 is likely related to the underlying degenerative changes. There is no fracture line or displaced fracture fragment identified. Facet joints are appropriately aligned throughout. Atherosclerotic calcifications noted at each carotid bulb region. Paravertebral soft tissues otherwise unremarkable. IMPRESSION: 1. No acute intracranial abnormality. No intracranial mass, hemorrhage  or edema. Atrophy and chronic ischemic changes, as detailed above. 2. No fracture or acute subluxation within the cervical spine. Chronic/degenerative findings detailed above. Electronically Signed   By: Franki Cabot M.D.   On: 09/27/2015 16:08   Ct Cervical Spine Wo Contrast  09/27/2015  CLINICAL DATA:  Altered mental status and fall. Lethargy and slurred speech. EXAM: CT HEAD WITHOUT CONTRAST CT CERVICAL SPINE WITHOUT CONTRAST TECHNIQUE: Multidetector CT imaging of the head and cervical spine was performed following the standard protocol without intravenous contrast. Multiplanar CT image reconstructions of the cervical spine were also generated. COMPARISON:  Brain MRI dated 05/13/2010. FINDINGS: CT HEAD FINDINGS Again noted is generalized age related brain atrophy with commensurate dilatation of the ventricles and sulci. Mild chronic small vessel ischemic changes again noted within the bilateral periventricular and subcortical white matter regions. 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 There is no mass, hemorrhage, edema or other evidence of acute parenchymal abnormality. No extra-axial hemorrhage. No osseous abnormality. Mild mucosal thickening noted in the left frontal sinus, of uncertain age but likely chronic. Visualized paranasal sinuses otherwise clear. CT CERVICAL SPINE FINDINGS Degenerative changes  throughout the cervical spine, moderate in degree with associated disc space narrowings and osseous spurring throughout. Combinations of disc-osteophytic bulges, uncovertebral joint hypertrophy and facet joint hypertrophy are causing mild to moderate central canal stenoses throughout with possible associated nerve root impingement. Slight retrolisthesis of C4 is likely related to the underlying degenerative changes. There is no fracture line or displaced fracture fragment identified. Facet joints are appropriately aligned throughout.  Atherosclerotic calcifications noted at each carotid bulb region. Paravertebral soft tissues otherwise unremarkable. IMPRESSION: 1. No acute intracranial abnormality. No intracranial mass, hemorrhage or edema. Atrophy and chronic ischemic changes, as detailed above. 2. No fracture or acute subluxation within the cervical spine. Chronic/degenerative findings detailed above. Electronically Signed   By: Franki Cabot M.D.   On: 09/27/2015 16:08   Dg Humerus Right  09/24/2015  CLINICAL DATA:  Right upper arm pain.  Fall. EXAM: RIGHT HUMERUS - 2+ VIEW COMPARISON:  No recent prior. FINDINGS: Acromioclavicular and glenohumeral degenerative change. No acute bony or joint abnormality identified. No evidence of fracture or dislocation. High-riding right shoulder consistent with chronic rotator cuff tear . IMPRESSION: Degenerative changes right shoulder and elbow. High-riding right shoulder consistent with rotator cuff tear. No acute abnormality. Electronically Signed   By: Marcello Moores  Register   On: 09/24/2015 16:20   Dg Hips Bilat With Pelvis 3-4 Views  09/27/2015  CLINICAL DATA:  Mental status changes, recent falls with bilateral hip pain. EXAM: DG HIP (WITH OR WITHOUT PELVIS) 3-4V BILAT COMPARISON:  None. FINDINGS: Single view of the pelvis and two views of each hip are provided. Osseous alignment is normal. There is osteopenia which limits characterization of osseous detail, however, there is no fracture line or displaced fracture fragment identified. Mild degenerative change noted at each hip and within the scoliotic lower lumbar spine. Additional degenerative changes/scleroses seen at the sacroiliac joint spaces. Calcified atherosclerotic changes noted within soft tissues about the pelvis and proximal femurs. Soft tissues about the pelvis and hips are otherwise unremarkable. IMPRESSION: No acute findings. No osseous fracture or dislocation. Osteopenia. Mild degenerative change. Atherosclerotic calcifications.  Electronically Signed   By: Franki Cabot M.D.   On: 09/27/2015 15:18    ASSESSMENT AND PLAN  1. New afib with RVR - Converted to sinus and then again went back into afib. Currently in afib at rate of 120s. On metoprolol to 25mg  BID. Will give metoprolol 25mg  x 1. Will discuss further management with MD. BP relatively soft. ? Increase BB further vs consideration of digoxin vs antiarrhythmic.  Continue ASA. Given h/o fall, would not be a good candidate for systemic anticoagulation  2. Acute diastolic HF in the setting of rapid HR - Echo 09/28/2015 normal EF, moderate LVH. Euvolemic on exam. Continue home dose of lasix.   3. Elevated trop: flat, likely demand ischemia  4. CAD s/p CABG: no obvious angina  5. Bilateral upper thigh pain: Per IM.  ABI suggests bilateral moderate arterial insufficiency at rest.  Signed, Bhagat,Bhavinkumar PA-C Pager [phone]  Patient seen and examined. Agree with assessment and plan. No chest pain. AF rate now in the 80's. Will try to increase metoprolol to 37.5 mg bid.She has normal LV function on echo. With age 82 would not favor antiarrythmic med initiation.    Troy Sine, MD, [facility] 09/30/2015 3:34 PM

## 2015-09-30 NOTE — Consult Note (Signed)
Vascular Surgery Consultation  Reason for Consult: Bilateral thigh pain  HPI: [name] is a 82 y.o. female who presents for evaluation of Bilateral thigh pain. Patient is well known to me having previously undergone laser ablation of the left great saphenous vein in the past a venous stasis ulcer which was in the right ankle area. This has subsequently healed after going to the wound center at [facility]. Patient had bleeding from a varix in the left leg and had laser ablation of the left great saphenous vein. The right great saphenous vein was previously removed for coronary artery bypass grafting in 1996. Patient has no other new ulcerations. She states thighs aching just above the knee continuously. She denies any recent history of ischemic ulcers, cellulitis, or gangrene. She is able to ambulate but is limited by her frailty. She does have diabetes mellitus  But does not take insulin at home.   Past Medical History  Diagnosis Date  . Hypertension   . Coronary artery disease     CABG 1998  . Anxiety   . Diastolic dysfunction   . Hyperlipidemia   . PUD (peptic ulcer disease)   . Microcytic anemia   . Depression   . Diabetes mellitus     type 2, followed by endo.  . Diverticulosis   . Hemorrhoids   . History of heart attack   . Irritable bowel syndrome   . Chronic back pain 03/2012    lumbar DDD with herniation - s/p L S1 transforaminal ESI (10/2012 Dr. Sharlet Salina)  . Varicose veins   . Cystocele   . Hx of echocardiogram     a. Echo 10/13: mild LVH, EF 55-60%, Gr 1 diast dysfn, PASP 32  . Acute deep vein thrombosis (DVT) of distal vein of right lower extremity ([place]) 12/2013    Acute thrombus of R proximal gastrocnemius vein. Symptomatic provoked distal DVT  . Cellulitis of leg, left 01/16/2014  . PAD (peripheral artery disease) ([place]) 2013    ABI: R 0.91, L 0.83  . Chronic venous insufficiency   . Ankle wound   . Viral gastroenteritis due to [place] virus 05/13/2015   Past  Surgical History  Procedure Laterality Date  . Tympanoplasty  1960s    R side  . Coronary artery bypass graft  1998  . Cataract surgery    . Cholecystectomy    . Appendectomy      per pt report  . Colonoscopy  02/2010    extensive diverticulosis throughout, small internal hemorrhoids, rec rpt 5 yrs (Dr. Allyn Kenner)  . Hand surgery    . Lexiscan myoview  03/2011    Small basal inferolateral and anterolateral reversible perfusion defect suggests ischemia.  EF was normal.   old infarct, no new ischemia  . Toe surgery    . Abdominal hysterectomy  1975    TAH,BSO  . Vault prolapse a & p repair  2009    Dr [facility]  . Dexa scan  09/2011    femur -0.8, forearm 0.6 => normal  . Esi  2014    L S1 transforaminal ESI x2 ([place]) without improvement  . Abi  2013    R 0.91, L 0.83  . Endovenous ablation saphenous vein w/ laser Left 03/2014    Kellie Simmering  . Cardiovascular stress test  2015    low risk myoview Clinical research associate)   Social History   Social History  . Marital Status: Single    Spouse Name: N/A  . Number  of Children: N/A  . Years of Education: N/A   Occupational History  . Waitress     Social History Main Topics  . Smoking status: Former Smoker    Quit date: 03/05/1979  . Smokeless tobacco: Never Used  . Alcohol Use: No  . Drug Use: No  . Sexual Activity: No     Comment: 1st intercourse 82yo.--- 2 sexual partners   Other Topics Concern  . 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   Independent for all ADLs, lives with a boyfriend.     Family History  Problem Relation Age of Onset  . Hypertension Mother   . Stroke Mother   . Diabetes Father   . Diabetes Sister   . Diabetes Brother   . Colon cancer Neg Hx    Allergies  Allergen Reactions  . Amoxicillin-Pot Clavulanate Other (See Comments)    Reaction:  Unknown   . Metformin And Related Diarrhea   Prior to Admission medications   Medication Sig Start Date End Date  Taking? Authorizing Provider  acetaminophen (TYLENOL) 500 MG tablet Take 500 mg by mouth every 6 (six) hours as needed for mild pain.   Yes Historical Provider, MD  acidophilus (RISAQUAD) CAPS capsule Take 1 capsule by mouth daily.   Yes Historical Provider, MD  ALPRAZolam Duanne Moron) 1 MG tablet Take 0.5 mg by mouth 4 (four) times daily as needed for anxiety.    Yes Historical Provider, MD  aspirin EC 81 MG tablet Take 81 mg by mouth daily.   Yes Historical Provider, MD  Dexlansoprazole (DEXILANT) 30 MG capsule Take 1 capsule (30 mg total) by mouth daily. 05/12/15  Yes Ria Bush, MD  ferrous sulfate 325 (65 FE) MG tablet Take 1 tablet (325 mg total) by mouth daily with breakfast. 09/06/14  Yes Ria Bush, MD  furosemide (LASIX) 20 MG tablet Take 20 mg by mouth daily.   Yes Historical Provider, MD  glucosamine-chondroitin 500-400 MG tablet Take 1 tablet by mouth daily.    Yes Historical Provider, MD  HYDROcodone-acetaminophen (NORCO/VICODIN) 5-325 MG tablet Take 0.5-1 tablets by mouth every 6 (six) hours as needed for moderate pain. 09/08/15  Yes Ria Bush, MD  loperamide (IMODIUM) 2 MG capsule Take 2 mg by mouth 4 (four) times daily as needed for diarrhea or loose stools.   Yes Historical Provider, MD  nystatin-triamcinolone ointment (MYCOLOG) Apply 1 application topically 2 (two) times daily as needed (for itching).   Yes Historical Provider, MD  pioglitazone (ACTOS) 30 MG tablet Take 30 mg by mouth every evening.    Yes Historical Provider, MD  pravastatin (PRAVACHOL) 20 MG tablet Take 20 mg by mouth at bedtime.   Yes Historical Provider, MD  ramipril (ALTACE) 5 MG capsule Take 5 mg by mouth every evening.    Yes Historical Provider, MD  traZODone (DESYREL) 150 MG tablet Take 150 mg by mouth at bedtime.    Yes Historical Provider, MD  triamcinolone cream (KENALOG) 0.1 % APPLY 1 APPLICATION TOPICALLY 2 (TWO) TIMES DAILY. 04/06/15  Yes Historical Provider, MD  vitamin B-12 (CYANOCOBALAMIN)  1000 MCG tablet Take 1,000 mcg by mouth daily.   Yes Historical Provider, MD  vitamin E 400 UNIT capsule Take 400 Units by mouth daily.   Yes Historical Provider, MD  nitroGLYCERIN (NITROSTAT) 0.4 MG SL tablet Place 0.4 mg under the tongue every 5 (five) minutes as needed for chest pain.     Wonda Cheng Nahser,  MD       All other systems have been reviewed and were otherwise negative with the exception of those mentioned in the HPI and as above.  Physical Exam: Filed Vitals:   09/30/15 1030 09/30/15 1437  BP: 108/78 123/53  Pulse: 120 91  Temp:  99.2 F (37.3 C)  Resp:  17    General: Alert, no acute distress HEENT: Normal for age Cardiovascular: Regular rate and rhythm. Carotid pulses 2+, no bruits audible Respiratory: Clear to auscultation. No cyanosis, no use of accessory musculature GI: No organomegaly, abdomen is soft and non-tender Skin: No lesions in the area of chief complaint Neurologic: Sensation intact distally Psychiatric: Patient is competent for consent with normal mood and affect Musculoskeletal: No obvious deformities Extremities: 33+ femoral 2+ popliteal pulses palpable bilaterally.  2+  Dorsalis pedis pulse palpable on the right foot and 1-2+ posterior tibial pulse palpable on the left foot. No ischemic ulcers noted. Both feet are pink and adequately perfused.   Labs reviewed: ABIs checked during this hospitalization right equals 0.78 left equals 0.6   Assessment/Plan:  Chronic bilateral thigh discomfort is not related to arterial or venous insufficiency.  Arterial circulation is adequate for healing most ulcerations and should not be causing  Thigh pain at rest. Patient has been evaluated and there are no further options for intervention of her venous system  We'll see patient on a when necessary basis but no need for any further vascular evaluation of her lower extremities   Tinnie Gens, MD 09/30/2015 4:15 PM

## 2015-10-01 DIAGNOSIS — R3915 Urgency of urination: Secondary | ICD-10-CM | POA: Insufficient documentation

## 2015-10-01 DIAGNOSIS — I480 Paroxysmal atrial fibrillation: Secondary | ICD-10-CM

## 2015-10-01 LAB — BASIC METABOLIC PANEL
ANION GAP: 9 (ref 5–15)
BUN: 27 mg/dL — ABNORMAL HIGH (ref 6–20)
CHLORIDE: 102 mmol/L (ref 101–111)
CO2: 26 mmol/L (ref 22–32)
Calcium: 8.5 mg/dL — ABNORMAL LOW (ref 8.9–10.3)
Creatinine, Ser: 0.94 mg/dL (ref 0.44–1.00)
GFR calc non Af Amer: 53 mL/min — ABNORMAL LOW (ref >60)
Glucose, Bld: 132 mg/dL — ABNORMAL HIGH (ref 65–99)
POTASSIUM: 3.7 mmol/L (ref 3.5–5.1)
SODIUM: 137 mmol/L (ref 135–145)

## 2015-10-01 LAB — GLUCOSE, CAPILLARY
Glucose-Capillary: 154 mg/dL — ABNORMAL HIGH (ref 65–99)
Glucose-Capillary: 251 mg/dL — ABNORMAL HIGH (ref 65–99)
Glucose-Capillary: 107 mg/dL — ABNORMAL HIGH (ref 65–99)
Glucose-Capillary: 140 mg/dL — ABNORMAL HIGH (ref 65–99)

## 2015-10-01 LAB — CBC
HCT: 34.4 % — ABNORMAL LOW (ref 36.0–46.0)
HEMOGLOBIN: 11.2 g/dL — AB (ref 12.0–15.0)
MCH: 30 pg (ref 26.0–34.0)
MCHC: 32.6 g/dL (ref 30.0–36.0)
MCV: 92.2 fL (ref 78.0–100.0)
PLATELETS: 292 10*3/uL (ref 150–400)
RBC: 3.73 MIL/uL — AB (ref 3.87–5.11)
RDW: 14.3 % (ref 11.5–15.5)
WBC: 18.8 10*3/uL — AB (ref 4.0–10.5)

## 2015-10-01 LAB — TROPONIN I
TROPONIN I: 0.03 ng/mL — AB (ref <0.03)
Troponin I: 0.05 ng/mL (ref <0.03)
Troponin I: 0.04 ng/mL (ref <0.03)

## 2015-10-01 NOTE — Progress Notes (Signed)
Occupational Therapy Treatment Patient Details Name: [name] MRN: [id number] DOB: [birth year] Today's Date: 10/01/2015    History of present illness 82 y.o. female admitted for AMS, fever, chills, general malaise, and UTI. Pt developed afib with RVR. PMH significant for HTN, HLD, CAD, CABG (1996), afib, dysphagia, diabetic polyneuropathy, DM type II, PUD, DVT of RLE.   OT comments  Pt is progressing slowly, but steadily. Pt required mod +2 assist initially and progressed to mod assist for subsequent transfers. Educated pt and pt's grandson on energy conservation strategies and HEP for BLE and RUE. Continue to recommend [place] [facility] [place] aide upon discharge. Will continue to follow acutely.   Follow Up Recommendations  Home health OT;Supervision/Assistance - 24 hour    Equipment Recommendations  None recommended by OT    Recommendations for Other Services      Precautions / Restrictions Precautions Precautions: Fall Restrictions Weight Bearing Restrictions: No       Mobility Bed Mobility               General bed mobility comments: Pt up in chair on OT arrival  Transfers Overall transfer level: Needs assistance Equipment used: Rolling walker (2 wheeled) Transfers: Sit to/from Stand Sit to Stand: Mod assist;+2 physical assistance         General transfer comment: Initial sit-stand from recliner required mod +2 assist. 2nd sit-stand transfer from toilet required mod assist with pt pulling up with grab bars. VCs for safe hand placement.    Balance Overall balance assessment: Needs assistance Sitting-balance support: No upper extremity supported;Feet supported Sitting balance-Leahy Scale: Good     Standing balance support: Bilateral upper extremity supported;During functional activity Standing balance-Leahy Scale: Poor Standing balance comment: reliant on UE support for balance                   ADL Overall ADL's : Needs assistance/impaired      Grooming: Wash/dry hands;Min guard;Standing   Upper Body Bathing: Supervision/ safety;Sitting   Lower Body Bathing: Moderate assistance;Sit to/from stand   Upper Body Dressing : Supervision/safety;Sitting   Lower Body Dressing: Moderate assistance;Sit to/from stand   Toilet Transfer: Moderate assistance;Cueing for safety;Ambulation;Regular Toilet;RW   Toileting- Clothing Manipulation and Hygiene: Moderate assistance;Sit to/from stand       Functional mobility during ADLs: Moderate assistance;Rolling walker        Vision                     Perception     Praxis      Cognition   Behavior During Therapy: WFL for tasks assessed/performed Overall Cognitive Status: Within Functional Limits for tasks assessed                       Extremity/Trunk Assessment               Exercises General Exercises - Upper Extremity Shoulder Flexion: AROM;Right;10 reps;Supine;Seated Shoulder ABduction: AROM;Both;10 reps;Supine;Seated Elbow Extension: AROM;Both;10 reps;Seated;Supine Wrist Flexion: AROM;Both;10 reps;Supine;Seated General Exercises - Lower Extremity Ankle Circles/Pumps: AROM;Both;10 reps;Supine;Seated Quad Sets: AROM;Both;10 reps;Supine;Seated Hip ABduction/ADduction: AROM;Both;10 reps;Supine;Seated Hip Flexion/Marching: AROM;Both;10 reps;Supine;Seated   Shoulder Instructions       General Comments      Pertinent Vitals/ Pain       Pain Assessment: Faces Faces Pain Scale: Hurts even more Pain Location: both inner thighs with movement Pain Descriptors / Indicators: Sore Pain Intervention(s): Limited activity within patient's tolerance;Monitored during session;Repositioned  Home Living  Prior Functioning/Environment              Frequency Min 3X/week     Progress Toward Goals  OT Goals(current goals can now be found in the care plan section)  Progress towards OT goals:  Progressing toward goals  Acute Rehab OT Goals Patient Stated Goal: to feel better and get back home and back to work OT Goal Formulation: With patient Time For Goal Achievement: 10/13/15 Potential to Achieve Goals: Good ADL Goals Pt Will Perform Grooming: with modified independence;sitting Pt Will Perform Lower Body Bathing: with supervision;sit to/from stand Pt Will Perform Lower Body Dressing: with supervision;sit to/from stand Pt Will Transfer to Toilet: with supervision;ambulating;bedside commode Pt Will Perform Toileting - Clothing Manipulation and hygiene: with supervision;sitting/lateral leans;sit to/from stand Additional ADL Goal #1: Pt will utilize 2 energy conservation strategies during ADL tasks with min verbal cues.  Plan Discharge plan remains appropriate    Co-evaluation                 End of Session Equipment Utilized During Treatment: Gait belt;Rolling walker   Activity Tolerance Patient tolerated treatment well   Patient Left in chair;with call bell/phone within reach;with family/visitor present   Nurse Communication Mobility status        Time: [id number] OT Time Calculation (min): 39 min  Charges: OT General Charges $OT Visit: 1 Procedure OT Treatments $Self Care/Home Management : 23-37 mins $Therapeutic Exercise: 8-22 mins  Redmond Baseman, OTR/L Pager: [phone] 10/01/2015, 3:14 PM

## 2015-10-01 NOTE — Progress Notes (Signed)
Subjective:  Feels better; walked with PT today; she com[plains of inner thigh discomfort.  Objective:   Vital Signs : Filed Vitals:   10/01/15 0048 10/01/15 0704 10/01/15 0837 10/01/15 1425  BP: 118/56 113/59 112/42 134/56  Pulse: 68 74 79 71  Temp:  98.6 F (37 C)  98.2 F (36.8 C)  TempSrc:  Oral  Oral  Resp: 18 18    Height:      Weight:  149 lb 9.6 oz (67.858 kg)    SpO2: 100% 95%  96%    Intake/Output from previous day:  Intake/Output Summary (Last 24 hours) at 10/01/15 1525 Last data filed at 10/01/15 0704  Gross per 24 hour  Intake    360 ml  Output    825 ml  Net   -465 ml    I/O since admission: -2569   Wt Readings from Last 3 Encounters:  10/01/15 149 lb 9.6 oz (67.858 kg)  09/24/15 154 lb 12.8 oz (70.217 kg)  09/21/15 153 lb (69.4 kg)    Medications: . antiseptic oral rinse  7 mL Mouth Rinse BID  . aspirin EC  81 mg Oral Daily  . cephALEXin  500 mg Oral Q12H  . ferrous sulfate  325 mg Oral Q breakfast  . furosemide  20 mg Oral Daily  . heparin subcutaneous  5,000 Units Subcutaneous Q8H  . insulin aspart  0-15 Units Subcutaneous TID WC  . insulin aspart  0-5 Units Subcutaneous QHS  . metoprolol tartrate  37.5 mg Oral BID  . pantoprazole  40 mg Oral Daily  . pravastatin  20 mg Oral QHS  . sodium chloride flush  3 mL Intravenous Q12H  . traZODone  150 mg Oral QHS       Physical Exam:   General appearance: alert, cooperative, fatigued and no distress Neck: no adenopathy, no JVD, supple, symmetrical, trachea midline and thyroid not enlarged, symmetric, no tenderness/mass/nodules Lungs: no wheezing Heart: regular rate and rhythm Abdomen: soft, non-tender; bowel sounds normal; no masses,  no organomegaly Extremities: no edema.  Neurologic: Grossly normal   Rate: 78  Rhythm: normal sinus rhythm  ECG (independently read by me): NSR at 66 with PACs  Lab Results:   Recent Labs  09/30/15 0330 10/01/15 0214  NA 136 137  K 3.6 3.7  CL  101 102  CO2 26 26  GLUCOSE 160* 132*  BUN 32* 27*  CREATININE 0.90 0.94  CALCIUM 8.2* 8.5*    Hepatic Function Latest Ref Rng 09/27/2015 05/13/2015 05/12/2015  Total Protein 6.5 - 8.1 g/dL 6.0(L) 6.8 7.6  Albumin 3.5 - 5.0 g/dL 2.2(L) 3.5 3.8  AST 15 - 41 U/L 22 34 24  ALT 14 - 54 U/L '20 20 17  '$ Alk Phosphatase 38 - 126 U/L 80 75 111  Total Bilirubin 0.3 - 1.2 mg/dL 0.8 0.8 0.8     Recent Labs  09/29/15 0254 10/01/15 0214  WBC 16.0* 18.8*  HGB 11.5* 11.2*  HCT 36.1 34.4*  MCV 90.3 92.2  PLT 193 292     Recent Labs  10/01/15 0942 10/01/15 1256  TROPONINI 0.05* 0.04*    Lab Results  Component Value Date   TSH 1.82 10/31/2014   No results for input(s): HGBA1C in the last 72 hours.  No results for input(s): PROT, ALBUMIN, AST, ALT, ALKPHOS, BILITOT, BILIDIR, IBILI in the last 72 hours. No results for input(s): INR in the last 72 hours. BNP (last 3 results)  Recent Labs  09/27/15 1652  BNP 376.7*    ProBNP (last 3 results) No results for input(s): PROBNP in the last 8760 hours.   Lipid Panel     Component Value Date/Time   CHOL 121* 04/13/2015 1440   TRIG 108 04/13/2015 1440   HDL 34* 04/13/2015 1440   CHOLHDL 3.6 04/13/2015 1440   VLDL 22 04/13/2015 1440   LDLCALC 65 04/13/2015 1440      Imaging:  No results found.    Assessment/Plan:   Active Problems:   Coronary artery disease   Anxiety   Hypertension   Diastolic dysfunction   Hyperlipidemia   Peptic ulcer disease   Dysphagia   Diabetic polyneuropathy (HCC)   Venous ulcer of leg (HCC)   Diabetes mellitus type 2 with peripheral artery disease (HCC)   Chronic venous insufficiency   Malnutrition of moderate degree   Acute respiratory failure (HCC)   Acute on chronic diastolic heart failure (HCC)   Persistent atrial fibrillation ([place])    1. New afib with RVR; re-converted back to sinus rhythm; tolerating metoprolol at 37.5 mg bid  2. Acute diastolic HF in the setting of rapid  HR - Echo 09/28/2015 normal EF, moderate LVH. Euvolemic on exam. Continue home dose of lasix.   3. Elevated trop: flat, likely demand ischemia  4. CAD s/p CABG: no obvious angina  5. Bilateral upper thigh pain: Per IM. ABI suggests bilateral moderate arterial insufficiency at rest. Undergoing PT evaluation, was able to walk 100 feet today.   [name] Sine, MD, [facility] 10/01/2015, 3:25 PM

## 2015-10-01 NOTE — Progress Notes (Signed)
Pt very anxious tonight. RN in to reposition pt then pt stated " I dont like the way my heart feels, it hurts". Pt complaining of chest pain. EKG obtained; NSR. VSS. Pt placed on 2L N/C.  Pt states that pain is going away. Pt falling asleep while talking. RN will continue to monitor pt.   Arnell Sieving, RN

## 2015-10-01 NOTE — Progress Notes (Addendum)
PROGRESS NOTE  [name]  [id number] DOB: 02-01-1928  DOA: 09/27/2015 PCP: [name] Bush, MD   Brief Narrative:  82 year old female, from home, PMH of HTN, CAD, CABG, chronic diastolic CHF, HLD, DM 2, PAD presented to [facility] ED on 09/27/15 with reported fever, chills, generalized weakness, altered mental status, dry cough, dyspnea and multiple falls. In ED and noted to be in A. fib with RVR, CT head and neck unremarkable. Admitted for presumed UTI many A. fib with RVR.   Assessment & Plan:   Active Problems:   Coronary artery disease   Anxiety   Hypertension   Diastolic dysfunction   Hyperlipidemia   Peptic ulcer disease   Dysphagia   Diabetic polyneuropathy (HCC)   Venous ulcer of leg (HCC)   Diabetes mellitus type 2 with peripheral artery disease (HCC)   Chronic venous insufficiency   Malnutrition of moderate degree   Acute respiratory failure (HCC)   Acute on chronic diastolic heart failure (HCC)   Persistent atrial fibrillation ([place])   New onset A. fib with RVR - Cardiology following. - Initially treated with Cardizem drip and IV heparin. Now off of both. - She had reverted to sinus rhythm but went back to A. fib with RVR in the 120s-130s on 6/28. Cardiology increased metoprolol dose. Patient reverted to sinus rhythm again. - Continue aspirin. As per prior records, not a good candidate for systemic anticoagulation due to fall risk.  Acute on chronic diastolic CHF - Precipitated by A. fib with RVR. - Echo 09/28/15: Normal EF and moderate LVH. - Diuresed and now compensated. Continue home dose of Lasix.  Elevated troponin - Per cardiology. Felt to be related to demand ischemia. Chest pain overnight was atypical,? Related to anxiety. EKG without acute changes. Minimally elevated troponin with flat trend.  CAD status post CABG - No reported chest pain.  DM 2 - A1c: 7.1. Continue SSI. Fluctuating.  Hyperlipidemia - Continue Pravachol  PAD/bilateral  varicosities and bilateral resting leg pain - S/p ablation of the left greater saphenous vein for painful varicosities two years ago.  - Vascular were consulted on admission by EDP, who recommended getting ABI and to call them if needed.  - ABI suggests bilateral moderate arterial insufficiency at rest. As per vascular surgery consultation notes, her thigh pain is not related to PAD. Patient's history is not very reliable. 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 No acute signs on physical exam. Ambulated with PT.  Escherichia coli UTI - Initially treated with IV Rocephin now changed to oral Keflex.  Acute encephalopathy - Likely related to acute illness including UTI. Seems to have improved. Unclear if back at baseline. Have to discuss with family to find out more.  Anemia - Stable  Acute kidney injury - Resolved.  Leukocytosis - May be related to UTI. Follow CBCs   DVT prophylaxis: Subcutaneous heparin Code Status: Full Family Communication: No family at bedside. Discussed with son Mr. [name] & updated care and answered questions. Disposition Plan: DC home when medically stable.   Consultants:    Cardiology  vascular surgery  Procedures:    None  Antimicrobials:    IV Rocephin 6/25 > 6/26  oral Keflex 6/27 >   Subjective: Overnight events noted. Had transient chest pain which has resolved and not recurred. States that her thigh pain is better. Seems confused and asks same questions that have already been answered.  Objective:  Filed Vitals:   10/01/15 0048 10/01/15  [id number] 10/01/15 0837 10/01/15 1425  BP: 118/56 113/59 112/42 134/56  Pulse: 68 74 79 71  Temp:  98.6 F (37 C)  98.2 F (36.8 C)  TempSrc:  Oral  Oral  Resp: 18 18    Height:      Weight:  67.858 kg (149 lb 9.6 oz)    SpO2: 100% 95%  96%    Intake/Output Summary (Last 24 hours) at 10/01/15 1641 Last data filed at 10/01/15 0704  Gross per 24 hour  Intake     360 ml  Output    825 ml  Net   -465 ml   Filed Weights   09/29/15 0500 09/30/15 0405 10/01/15 0704  Weight: 69.4 kg (153 lb) 65.8 kg (145 lb 1 oz) 67.858 kg (149 lb 9.6 oz)    Examination:  General exam: Pleasant elderly female sitting up comfortably in chair this morning. Respiratory system: Clear to auscultation. Respiratory effort normal. Cardiovascular system: S1 & S2 heard, RRR. No JVD, murmurs, rubs, gallops or clicks. No pedal edema. Telemetry: Reverted to sinus rhythm.  Gastrointestinal system: Abdomen is nondistended, soft and nontender. No organomegaly or masses felt. Normal bowel sounds heard. Central nervous system: Alert and oriented 2. No focal neurological deficits. Extremities: Symmetric 5 x 5 power. Diminished bilateral dorsalis pedis pulsations.  Skin: No rashes, lesions or ulcers Psychiatry: Judgement and insight appear impaired. Mood & affect appropriate.     Data Reviewed: I have personally reviewed following labs and imaging studies  CBC:  Recent Labs Lab 09/27/15 1434 09/28/15 0155 09/29/15 0254 10/01/15 0214  WBC 18.5* 19.8* 16.0* 18.8*  NEUTROABS 15.7*  --   --   --   HGB 12.7 11.8* 11.5* 11.2*  HCT 37.9 36.0 36.1 34.4*  MCV 90.5 90.0 90.3 92.2  PLT 172 175 193 [id number]   Basic Metabolic Panel:  Recent Labs Lab 09/27/15 1434 09/28/15 0545 09/30/15 0330 10/01/15 0214  NA 133* 137 136 137  K 4.1 3.8 3.6 3.7  CL 104 105 101 102  CO2 22 21* 26 26  GLUCOSE 204* 163* 160* 132*  BUN 48* 43* 32* 27*  CREATININE 1.25* 1.27* 0.90 0.94  CALCIUM 8.4* 8.2* 8.2* 8.5*   GFR: Estimated Creatinine Clearance: 38.8 mL/min (by C-G formula based on Cr of 0.94). Liver Function Tests:  Recent Labs Lab 09/27/15 1434  AST 22  ALT 20  ALKPHOS 80  BILITOT 0.8  PROT 6.0*  ALBUMIN 2.2*   No results for input(s): LIPASE, AMYLASE in the last 168 hours. No results for input(s): AMMONIA in the last 168 hours. Coagulation Profile: No results for  input(s): INR, PROTIME in the last 168 hours. Cardiac Enzymes:  Recent Labs Lab 09/27/15 2013 09/28/15 0052 09/28/15 0553 10/01/15 0942 10/01/15 1256  TROPONINI 0.04* 0.03 0.05* 0.05* 0.04*   BNP (last 3 results) No results for input(s): PROBNP in the last 8760 hours. HbA1C: No results for input(s): HGBA1C in the last 72 hours. CBG:  Recent Labs Lab 09/30/15 1614 09/30/15 2114 10/01/15 0706 10/01/15 1109 10/01/15 1623  GLUCAP 134* 191* 154* 251* 107*   Lipid Profile: No results for input(s): CHOL, HDL, LDLCALC, TRIG, CHOLHDL, LDLDIRECT in the last 72 hours. Thyroid Function Tests: No results for input(s): TSH, T4TOTAL, [name]4, T3FREE, THYROIDAB in the last 72 hours. Anemia Panel: No results for input(s): VITAMINB12, FOLATE, FERRITIN, TIBC, IRON, RETICCTPCT in the last 72 hours.  Sepsis Labs:  Recent Labs Lab 09/27/15 1436  LATICACIDVEN 1.12    Recent Results (from  the past 240 hour(s))  Urine culture     Status: Abnormal   Collection Time: 09/27/15  3:20 PM  Result Value Ref Range Status   Specimen Description URINE, CLEAN CATCH  Final   Special Requests NONE  Final   Culture >=100,000 COLONIES/mL ESCHERICHIA COLI (A)  Final   Report Status 09/29/2015 FINAL  Final   Organism ID, Bacteria ESCHERICHIA COLI (A)  Final      Susceptibility   Escherichia coli - MIC*    AMPICILLIN >=32 RESISTANT Resistant     CEFAZOLIN <=4 SENSITIVE Sensitive     CEFTRIAXONE <=1 SENSITIVE Sensitive     CIPROFLOXACIN <=0.25 SENSITIVE Sensitive     GENTAMICIN <=1 SENSITIVE Sensitive     IMIPENEM <=0.25 SENSITIVE Sensitive     NITROFURANTOIN <=16 SENSITIVE Sensitive     TRIMETH/SULFA >=320 RESISTANT Resistant     AMPICILLIN/SULBACTAM 16 INTERMEDIATE Intermediate     PIP/TAZO <=4 SENSITIVE Sensitive     * >=100,000 COLONIES/mL ESCHERICHIA COLI  MRSA PCR Screening     Status: None   Collection Time: 09/27/15 10:29 PM  Result Value Ref Range Status   MRSA by PCR NEGATIVE NEGATIVE  Final    Comment:        The GeneXpert MRSA Assay (FDA approved for NASAL specimens only), is one component of a comprehensive MRSA colonization surveillance program. It is not intended to diagnose MRSA infection nor to guide or monitor treatment for MRSA infections.          Radiology Studies: No results found.      Scheduled Meds: . antiseptic oral rinse  7 mL Mouth Rinse BID  . aspirin EC  81 mg Oral Daily  . cephALEXin  500 mg Oral Q12H  . ferrous sulfate  325 mg Oral Q breakfast  . furosemide  20 mg Oral Daily  . heparin subcutaneous  5,000 Units Subcutaneous Q8H  . insulin aspart  0-15 Units Subcutaneous TID WC  . insulin aspart  0-5 Units Subcutaneous QHS  . metoprolol tartrate  37.5 mg Oral BID  . pantoprazole  40 mg Oral Daily  . pravastatin  20 mg Oral QHS  . sodium chloride flush  3 mL Intravenous Q12H  . traZODone  150 mg Oral QHS   Continuous Infusions:    LOS: 4 days    Time spent: 30 minutes.    [facility], MD [facility] Pager [phone]  If 7PM-7AM, please contact night-coverage [email or web address] Password [facility] 10/01/2015, 4:41 PM

## 2015-10-01 NOTE — Progress Notes (Signed)
Utilization review completed.  

## 2015-10-02 DIAGNOSIS — I4892 Unspecified atrial flutter: Secondary | ICD-10-CM

## 2015-10-02 DIAGNOSIS — R197 Diarrhea, unspecified: Secondary | ICD-10-CM

## 2015-10-02 LAB — CBC
HCT: 34.3 % — ABNORMAL LOW (ref 36.0–46.0)
HEMOGLOBIN: 10.8 g/dL — AB (ref 12.0–15.0)
MCH: 29.8 pg (ref 26.0–34.0)
MCHC: 31.5 g/dL (ref 30.0–36.0)
MCV: 94.8 fL (ref 78.0–100.0)
PLATELETS: 327 10*3/uL (ref 150–400)
RBC: 3.62 MIL/uL — ABNORMAL LOW (ref 3.87–5.11)
RDW: 14 % (ref 11.5–15.5)
WBC: 19.4 10*3/uL — ABNORMAL HIGH (ref 4.0–10.5)

## 2015-10-02 LAB — GLUCOSE, CAPILLARY
GLUCOSE-CAPILLARY: 155 mg/dL — AB (ref 65–99)
GLUCOSE-CAPILLARY: 201 mg/dL — AB (ref 65–99)
GLUCOSE-CAPILLARY: 94 mg/dL (ref 65–99)
Glucose-Capillary: 122 mg/dL — ABNORMAL HIGH (ref 65–99)

## 2015-10-02 LAB — BASIC METABOLIC PANEL
Anion gap: 7 (ref 5–15)
BUN: 25 mg/dL — AB (ref 6–20)
CALCIUM: 8.6 mg/dL — AB (ref 8.9–10.3)
CO2: 28 mmol/L (ref 22–32)
CREATININE: 1 mg/dL (ref 0.44–1.00)
Chloride: 103 mmol/L (ref 101–111)
GFR calc Af Amer: 57 mL/min — ABNORMAL LOW (ref >60)
GFR, EST NON AFRICAN AMERICAN: 50 mL/min — AB (ref >60)
GLUCOSE: 142 mg/dL — AB (ref 65–99)
Potassium: 4 mmol/L (ref 3.5–5.1)
Sodium: 138 mmol/L (ref 135–145)

## 2015-10-02 MED ORDER — MUPIROCIN 2 % EX OINT
TOPICAL_OINTMENT | Freq: Two times a day (BID) | CUTANEOUS | Status: DC
  Administered 2015-10-02: 16:00:00 via NASAL
  Administered 2015-10-02: 1 via NASAL
  Administered 2015-10-03 – 2015-10-05 (×5): via NASAL

## 2015-10-02 MED ORDER — SACCHAROMYCES BOULARDII 250 MG PO CAPS
250.0000 mg | ORAL_CAPSULE | Freq: Two times a day (BID) | ORAL | Status: DC
  Administered 2015-10-02 – 2015-10-05 (×7): 250 mg via ORAL
  Filled 2015-10-02 (×7): qty 1

## 2015-10-02 MED ORDER — MUPIROCIN CALCIUM 2 % EX CREA
TOPICAL_CREAM | Freq: Two times a day (BID) | CUTANEOUS | Status: DC
  Administered 2015-10-02: 15:00:00 via TOPICAL
  Filled 2015-10-02: qty 15

## 2015-10-02 MED ORDER — ALUM & MAG HYDROXIDE-SIMETH 200-200-20 MG/5ML PO SUSP
15.0000 mL | ORAL | Status: DC | PRN
  Administered 2015-10-02 – 2015-10-04 (×2): 15 mL via ORAL
  Filled 2015-10-02 (×3): qty 30

## 2015-10-02 NOTE — Progress Notes (Signed)
Subjective:  Feels better; walked with PT today; she com[plains of inner thigh discomfort.  Objective:   Vital Signs : Filed Vitals:   10/01/15 2123 10/02/15 0304 10/02/15 0425 10/02/15 1325  BP: 146/58  135/49 126/58  Pulse: 77  64 63  Temp: 98.7 F (37.1 C)  98.3 F (36.8 C) 98.3 F (36.8 C)  TempSrc: Oral  Oral Oral  Resp: _0 Height:      Weight:  140 lb 12.8 oz (63.866 kg)    SpO2: 97%  93% 98%    Intake/Output from previous day:  Intake/Output Summary (Last 24 hours) at 10/02/15 1415 Last data filed at 10/02/15 1325  Gross per 24 hour  Intake    480 ml  Output    979 ml  Net   -499 ml    I/O since admission: -2569   Wt Readings from Last 3 Encounters:  10/02/15 140 lb 12.8 oz (63.866 kg)  09/24/15 154 lb 12.8 oz (70.217 kg)  09/21/15 153 lb (69.4 kg)    Medications: . antiseptic oral rinse  7 mL Mouth Rinse BID  . aspirin EC  81 mg Oral Daily  . cephALEXin  500 mg Oral Q12H  . ferrous sulfate  325 mg Oral Q breakfast  . furosemide  20 mg Oral Daily  . heparin subcutaneous  5,000 Units Subcutaneous Q8H  . insulin aspart  0-15 Units Subcutaneous TID WC  . insulin aspart  0-5 Units Subcutaneous QHS  . metoprolol tartrate  37.5 mg Oral BID  . pantoprazole  40 mg Oral Daily  . pravastatin  20 mg Oral QHS  . saccharomyces boulardii  250 mg Oral BID  . sodium chloride flush  3 mL Intravenous Q12H  . traZODone  150 mg Oral QHS       Physical Exam:   General appearance: alert, cooperative, fatigued and no distress Neck: no adenopathy, no JVD, supple, symmetrical, trachea midline and thyroid not enlarged, symmetric, no tenderness/mass/nodules Lungs: no wheezing Heart: regular rate and rhythm Abdomen: soft, non-tender; bowel sounds normal; no masses,  no organomegaly Extremities: no edema.  Neurologic: Grossly normal   Rate: 78  Rhythm: normal sinus rhythm  ECG (independently read by me): NSR at 66 with PACs  Lab Results:   Recent  Labs  09/30/15 0330 10/01/15 0214 10/02/15 0340  NA 136 137 138  K 3.6 3.7 4.0  CL 101 102 103  CO2 _1 GLUCOSE 160* 132* 142*  BUN 32* 27* 25*  CREATININE 0.90 0.94 1.00  CALCIUM 8.2* 8.5* 8.6*    Hepatic Function Latest Ref Rng 09/27/2015 05/13/2015 05/12/2015  Total Protein 6.5 - 8.1 g/dL 6.0(L) 6.8 7.6  Albumin 3.5 - 5.0 g/dL 2.2(L) 3.5 3.8  AST 15 - 41 U/L 22 34 24  ALT 14 - 54 U/L _2 Alk Phosphatase 38 - 126 U/L 80 75 111  Total Bilirubin 0.3 - 1.2 mg/dL 0.8 0.8 0.8     Recent Labs  10/01/15 0214 10/02/15 0340  WBC 18.8* 19.4*  HGB 11.2* 10.8*  HCT 34.4* 34.3*  MCV 92.2 94.8  PLT 292 327     Recent Labs  10/01/15 0942 10/01/15 1256 10/01/15 1856  TROPONINI 0.05* 0.04* 0.03*    Lab Results  Component Value Date   TSH 1.82 10/31/2014   No results for input(s): HGBA1C in the last 72 hours.  No results for input(s): PROT, ALBUMIN, AST, ALT, ALKPHOS, BILITOT, BILIDIR, IBILI  in the last 72 hours. No results for input(s): INR in the last 72 hours. BNP (last 3 results)  Recent Labs  09/27/15 1652  BNP 376.7*    ProBNP (last 3 results) No results for input(s): PROBNP in the last 8760 hours.   Lipid Panel     Component Value Date/Time   CHOL 121* 04/13/2015 1440   TRIG 108 04/13/2015 1440   HDL 34* 04/13/2015 1440   CHOLHDL 3.6 04/13/2015 1440   VLDL 22 04/13/2015 1440   LDLCALC 65 04/13/2015 1440      Imaging:  No results found.    Assessment/Plan:   Active Problems:   Coronary artery disease   Anxiety   Hypertension   Diastolic dysfunction   Hyperlipidemia   Peptic ulcer disease   Dysphagia   Diabetic polyneuropathy (HCC)   Venous ulcer of leg (HCC)   Diabetes mellitus type 2 with peripheral artery disease (HCC)   Chronic venous insufficiency   Malnutrition of moderate degree   Acute respiratory failure (HCC)   Acute on chronic diastolic heart failure (HCC)   Persistent atrial fibrillation (HCC)   Paroxysmal  atrial fibrillation (HCC)   Urinary tract infection, site not specified    1. New afib with RVR; re-converted back to sinus rhythm; tolerating metoprolol at 37.5 mg bid. BP 126/58 today.   2. Acute diastolic HF in the setting of rapid HR - Echo 09/28/2015 normal EF, moderate LVH. Euvolemic on exam. Continue home dose of lasix.   3. Elevated trop: flat, likely demand ischemia  4. CAD s/p CABG: no obvious angina  5. Bilateral upper thigh and mild calf discomfort; no palpable cord. : ABI suggests bilateral moderate arterial insufficiency at rest. Walking with cardiac rehab.    A. , MD, FACC 10/02/2015, 2:15 PM  

## 2015-10-02 NOTE — Care Management Important Message (Signed)
Important Message  Patient Details  Name: MIAMOR PIGOTT MRN: [id number] Date of Birth: [birth year]   Medicare Important Message Given:  Yes    Arora Coakley Ab[place] 10/02/2015, 11:17 AM

## 2015-10-02 NOTE — Progress Notes (Signed)
Pt IV site pink with some swelling noted.  Call placed to attending, received order to DC IV site and apply bactroban.  Upon IV removal, appeared to have yellow crust around site of IV insertion.  Placed bactroban on gauze and applied to IV site.  Pt states site is sore.  Will apply bactroban BID and continue to monitor.

## 2015-10-02 NOTE — Progress Notes (Signed)
Pt stated "my heart hurts". When further assessing pt denies chest pain and says that "something is just off". Vital signs are stable. Pt is very anxious. She states that she feels "out of control". Will continue to monitor and provide support.

## 2015-10-02 NOTE — Progress Notes (Signed)
PROGRESS NOTE  [name]  [id number] DOB: 1927/11/25  DOA: 09/27/2015 PCP: Ria Bush, MD   Brief Narrative:  82 year old female, from home, PMH of HTN, CAD, CABG, chronic diastolic CHF, HLD, DM 2, PAD presented to [facility] ED on 09/27/15 with reported fever, chills, generalized weakness, altered mental status, dry cough, dyspnea and multiple falls. In ED and noted to be in A. fib with RVR, CT head and neck unremarkable. Admitted for presumed UTI many A. fib with RVR. Cardiology following. Has reverted to sinus rhythm. Deemed not a candidate for long-term anticoagulation due to fall risk. New-onset diarrhea 6/30. Awaiting PT evaluation. Not yet medically stable for discharge.   Assessment & Plan:   Active Problems:   Coronary artery disease   Anxiety   Hypertension   Diastolic dysfunction   Hyperlipidemia   Peptic ulcer disease   Dysphagia   Diabetic polyneuropathy (HCC)   Venous ulcer of leg (HCC)   Diabetes mellitus type 2 with peripheral artery disease (HCC)   Chronic venous insufficiency   Malnutrition of moderate degree   Acute respiratory failure (HCC)   Acute on chronic diastolic heart failure (HCC)   Persistent atrial fibrillation (HCC)   Paroxysmal atrial fibrillation (HCC)   Urinary tract infection, site not specified   New onset A. fib with RVR - Cardiology following. - Initially treated with Cardizem drip and IV heparin. Now off of both. - She had reverted to sinus rhythm but went back to A. fib with RVR in the 120s-130s on 6/28. Cardiology increased metoprolol dose. Patient reverted to sinus rhythm again and has remained in sinus rhythm thus far. - Continue aspirin. As per prior records, not a good candidate for systemic anticoagulation due to fall risk.  Acute on chronic diastolic CHF - Precipitated by A. fib with RVR. - Echo 09/28/15: Normal EF and moderate LVH. - Diuresed and now compensated. Continue home dose of Lasix. DC Foley catheter which was  placed for monitoring of urine output.  Elevated troponin - Per cardiology. Felt to be related to demand ischemia. Chest pain overnight was atypical,? Related to anxiety. EKG without acute changes. Minimally elevated troponin with flat trend.  CAD status post CABG - No reported chest pain.  DM 2 - A1c: 7.1. Continue SSI. Fluctuating.  Hyperlipidemia - Continue Pravachol  PAD/bilateral varicosities and bilateral resting leg pain - S/p ablation of the left greater saphenous vein for painful varicosities two years ago.  - Vascular were consulted on admission by EDP, who recommended getting ABI and to call them if needed.  - ABI suggests bilateral moderate arterial insufficiency at rest. As per vascular surgery consultation notes, her thigh pain is not related to PAD. Patient's history is not very reliable. 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 No acute signs on physical exam. Ambulated with PT.  Escherichia coli UTI - Initially treated with IV Rocephin now changed to oral Keflex. Day 6/7 antibiotics thus far. No urinary symptoms reported. New onset diarrhea on 6/30. Persisting leukocytosis which is slightly increased from 16 >19.4 over the last 3 days.  Diarrhea - Noted 6/30. For now add probiotics and monitor. If diarrhea persists or worsens, check C. difficile PCR.  Acute encephalopathy - Likely related to acute illness including UTI. Seems to have improved. As per discussion with patient's son on 6/29, he has been visiting her couple times daily and indicates that her mental status is significantly improved compared to initial admission. She seems to  have baseline mild cognitive impairment and possible dementia.  Anemia - Stable  Acute kidney injury - Resolved.  Leukocytosis - May be related to UTI. Consider testing for C. difficile if diarrhea persists or worsens. No evidence of skin breakdown on detailed exam with patient's RN on 6/30.   DVT  prophylaxis: Subcutaneous heparin Code Status: Full Family Communication: No family at bedside. Discussed with son Mr. Hermie Hok on 6/29 & updated care and answered questions. Disposition Plan: DC when medically stable. OT recommends 24/7 supervision. Patient states that she lives with 2 elderly friends and unclear if they will be able to provide her with the required supervision or assistance. Await PT evaluation.   Consultants:   Cardiology  Vascular surgery  Procedures:    Foley catheter-discontinue 6/30  Antimicrobials:   IV Rocephin 6/25 > 6/26  Oral Keflex 6/27 >   Subjective: Intermittent confusion. Denies thigh pain. As per RN, 3-4 episodes of loose stools this morning which are not foul-smelling and no blood reported. Patient denies abdominal pain.  Objective:  Filed Vitals:   10/01/15 1821 10/01/15 2123 10/02/15 0304 10/02/15 0425  BP:  146/58  135/49  Pulse:  77  64  Temp: 99 F (37.2 C) 98.7 F (37.1 C)  98.3 F (36.8 C)  TempSrc: Oral Oral  Oral  Resp:  24  18  Height:      Weight:   63.866 kg (140 lb 12.8 oz)   SpO2:  97%  93%    Intake/Output Summary (Last 24 hours) at 10/02/15 1155 Last data filed at 10/02/15 0926  Gross per 24 hour  Intake    240 ml  Output    978 ml  Net   -738 ml   Filed Weights   09/30/15 0405 10/01/15 0704 10/02/15 0304  Weight: 65.8 kg (145 lb 1 oz) 67.858 kg (149 lb 9.6 oz) 63.866 kg (140 lb 12.8 oz)    Examination:  General exam: Pleasant elderly female sitting up comfortably in chair this morning.Was able to get up with nursing assistance, her walker and take couple of steps to her bed. Does not look septic or toxic.  Respiratory system: Clear to auscultation. Respiratory effort normal. Cardiovascular system: S1 & S2 heard, RRR. No JVD, murmurs, rubs, gallops or clicks. No pedal edema. Telemetry: Reverted to sinus rhythm.  Gastrointestinal system: Abdomen is nondistended, soft and nontender. No organomegaly or  masses felt. Normal bowel sounds heard. Central nervous system: Alert and oriented 2. No focal neurological deficits. Extremities: Symmetric 5 x 5 power. Diminished bilateral dorsalis pedis pulsations.  Skin: Examined in detail with patient's female and nursing aide at bedside. Apart from couple of old small superficial bruises over left mid back and one over right medial mid thigh, no other acute findings.  Psychiatry: Judgement and insight appear impaired. Mood & affect appropriate.     Data Reviewed: I have personally reviewed following labs and imaging studies  CBC:  Recent Labs Lab 09/27/15 1434 09/28/15 0155 09/29/15 0254 10/01/15 0214 10/02/15 0340  WBC 18.5* 19.8* 16.0* 18.8* 19.4*  NEUTROABS 15.7*  --   --   --   --   HGB 12.7 11.8* 11.5* 11.2* 10.8*  HCT 37.9 36.0 36.1 34.4* 34.3*  MCV 90.5 90.0 90.3 92.2 94.8  PLT 172 175 193 292 [id number]   Basic Metabolic Panel:  Recent Labs Lab 09/27/15 1434 09/28/15 0545 09/30/15 0330 10/01/15 0214 10/02/15 0340  NA 133* 137 136 137 138  K 4.1 3.8 3.6  3.7 4.0  CL 104 105 101 102 103  CO2 22 21* 26 26 28   GLUCOSE 204* 163* 160* 132* 142*  BUN 48* 43* 32* 27* 25*  CREATININE 1.25* 1.27* 0.90 0.94 1.00  CALCIUM 8.4* 8.2* 8.2* 8.5* 8.6*   GFR: Estimated Creatinine Clearance: 35.4 mL/min (by C-G formula based on Cr of 1). Liver Function Tests:  Recent Labs Lab 09/27/15 1434  AST 22  ALT 20  ALKPHOS 80  BILITOT 0.8  PROT 6.0*  ALBUMIN 2.2*   No results for input(s): LIPASE, AMYLASE in the last 168 hours. No results for input(s): AMMONIA in the last 168 hours. Coagulation Profile: No results for input(s): INR, PROTIME in the last 168 hours. Cardiac Enzymes:  Recent Labs Lab 09/28/15 0052 09/28/15 0553 10/01/15 0942 10/01/15 1256 10/01/15 1856  TROPONINI 0.03 0.05* 0.05* 0.04* 0.03*   BNP (last 3 results) No results for input(s): PROBNP in the last 8760 hours. HbA1C: No results for input(s): HGBA1C in the  last 72 hours. CBG:  Recent Labs Lab 10/01/15 1109 10/01/15 1623 10/01/15 2139 10/02/15 0628 10/02/15 1125  GLUCAP 251* 107* 140* 155* 201*   Lipid Profile: No results for input(s): CHOL, HDL, LDLCALC, TRIG, CHOLHDL, LDLDIRECT in the last 72 hours. Thyroid Function Tests: No results for input(s): TSH, T4TOTAL, [name]4, T3FREE, THYROIDAB in the last 72 hours. Anemia Panel: No results for input(s): VITAMINB12, FOLATE, FERRITIN, TIBC, IRON, RETICCTPCT in the last 72 hours.  Sepsis Labs:  Recent Labs Lab 09/27/15 1436  LATICACIDVEN 1.12    Recent Results (from the past 240 hour(s))  Urine culture     Status: Abnormal   Collection Time: 09/27/15  3:20 PM  Result Value Ref Range Status   Specimen Description URINE, CLEAN CATCH  Final   Special Requests NONE  Final   Culture >=100,000 COLONIES/mL ESCHERICHIA COLI (A)  Final   Report Status 09/29/2015 FINAL  Final   Organism ID, Bacteria ESCHERICHIA COLI (A)  Final      Susceptibility   Escherichia coli - MIC*    AMPICILLIN >=32 RESISTANT Resistant     CEFAZOLIN <=4 SENSITIVE Sensitive     CEFTRIAXONE <=1 SENSITIVE Sensitive     CIPROFLOXACIN <=0.25 SENSITIVE Sensitive     GENTAMICIN <=1 SENSITIVE Sensitive     IMIPENEM <=0.25 SENSITIVE Sensitive     NITROFURANTOIN <=16 SENSITIVE Sensitive     TRIMETH/SULFA >=320 RESISTANT Resistant     AMPICILLIN/SULBACTAM 16 INTERMEDIATE Intermediate     PIP/TAZO <=4 SENSITIVE Sensitive     * >=100,000 COLONIES/mL ESCHERICHIA COLI  MRSA PCR Screening     Status: None   Collection Time: 09/27/15 10:29 PM  Result Value Ref Range Status   MRSA by PCR NEGATIVE NEGATIVE Final    Comment:        The GeneXpert MRSA Assay (FDA approved for NASAL specimens only), is one component of a comprehensive MRSA colonization surveillance program. It is not intended to diagnose MRSA infection nor to guide or monitor treatment for MRSA infections.          Radiology Studies: No results  found.      Scheduled Meds: . antiseptic oral rinse  7 mL Mouth Rinse BID  . aspirin EC  81 mg Oral Daily  . cephALEXin  500 mg Oral Q12H  . ferrous sulfate  325 mg Oral Q breakfast  . furosemide  20 mg Oral Daily  . heparin subcutaneous  5,000 Units Subcutaneous Q8H  . insulin aspart  0-15 Units  Subcutaneous TID WC  . insulin aspart  0-5 Units Subcutaneous QHS  . metoprolol tartrate  37.5 mg Oral BID  . pantoprazole  40 mg Oral Daily  . pravastatin  20 mg Oral QHS  . saccharomyces boulardii  250 mg Oral BID  . sodium chloride flush  3 mL Intravenous Q12H  . traZODone  150 mg Oral QHS   Continuous Infusions:    LOS: 5 days    Time spent: 30 minutes.    [facility], MD [facility] Pager [phone]  If 7PM-7AM, please contact night-coverage [email or web address] Password [email or web address] 10/02/2015, 11:55 AM

## 2015-10-02 NOTE — Progress Notes (Signed)
Physical Therapy Treatment Patient Details Name: [name] MRN: [id number] DOB: [birth year] Today's Date: 10/02/2015    History of Present Illness 82 y.o. female admitted for AMS, fever, chills, general malaise, and UTI. Pt developed afib with RVR. PMH significant for HTN, HLD, CAD, CABG (1996), afib, dysphagia, diabetic polyneuropathy, DM type II, PUD, DVT of RLE.    PT Comments    Patient able to walker farther than last PT session, though relates some discomfort and SOB.  Able to negotiate stairs with assist with rail (has 7 steps to enter home with rail and boyfriend and another friend help).  Still feel SNF level rehab safest plan, but patient refusing so recommend home with HHPT and as much capable assist over 24 hours as possible.  Follow Up Recommendations  Home health PT;Supervision/Assistance - 24 hour (and HH aide; still refusing rehab)     Equipment Recommendations  None recommended by PT    Recommendations for Other Services       Precautions / Restrictions Precautions Precautions: Fall Precaution Comments: multiple falls at home    Mobility  Bed Mobility Overal bed mobility: Needs Assistance Bed Mobility: Sit to Supine       Sit to supine: Mod assist   General bed mobility comments: assist for legs into bed, reports hjad to have help for this at home  Transfers Overall transfer level: Needs assistance Equipment used: Rolling walker (2 wheeled) Transfers: Sit to/from Stand Sit to Stand: Mod assist         General transfer comment: up from recliner x 3 and up from toilet x 1 (does better when she can pull up on grabbar or on wall rail; assist to sit for safety esp on toilet as lower surface and was not backed up all the way; educated in anterior weight shift and flexing knees as much as possible to get her weight over her feet  Ambulation/Gait Ambulation/Gait assistance: Min guard;Supervision Ambulation Distance (Feet): 150 Feet Assistive device:  Rolling walker (2 wheeled) Gait Pattern/deviations: Step-through pattern;Trunk flexed;Decreased stride length;Shuffle     General Gait Details: fatigued with ambulation HR WNL during ambulation, but c/o SOB and some leg pain, but determined to go all the way around nurses station.    Stairs Stairs: Yes Stairs assistance: Mod assist Stair Management: Step to pattern;Sideways;One rail Left Number of Stairs: 5 General stair comments: pulls up on rail, needed assist for safety esp with descent to prevent uncontrolled descent  Wheelchair Mobility    Modified Rankin (Stroke Patients Only)       Balance Overall balance assessment: Needs assistance   Sitting balance-Leahy Scale: Good     Standing balance support: Bilateral upper extremity supported Standing balance-Leahy Scale: Poor Standing balance comment: UE support for balance, held onto rail on wall during perineal hygiene after toileting                    Cognition Arousal/Alertness: Awake/alert Behavior During Therapy: Anxious Overall Cognitive Status: Within Functional Limits for tasks assessed                      Exercises      General Comments General comments (skin integrity, edema, etc.): Patient relates her boyfriend owns the restaurant and lounge at [employer]; "Stubby" an 80 y/o man that stays with her and helps her some, but can't help to lift her up; reviewed precautions and fall prevention information      Pertinent Vitals/Pain Faces Pain Scale:  Hurts even more Pain Location: thighs and R calf with ambualtion Pain Descriptors / Indicators: Sore Pain Intervention(s): Monitored during session;Repositioned    Home Living                      Prior Function            PT Goals (current goals can now be found in the care plan section) Progress towards PT goals: Progressing toward goals    Frequency  Min 3X/week    PT Plan Current plan remains appropriate     Co-evaluation             End of Session Equipment Utilized During Treatment: Gait belt Activity Tolerance: Patient limited by fatigue Patient left: with call bell/phone within reach;in bed;with bed alarm set     Time: [id number] PT Time Calculation (min) (ACUTE ONLY): 40 min  Charges:  $Gait Training: 23-37 mins $Self Care/Home Management: 8-22                    G Codes:      Reginia Naas 10-22-2015, 2:56 PM Magda Kiel, [place] October 22, 2015

## 2015-10-03 LAB — BASIC METABOLIC PANEL
ANION GAP: 7 (ref 5–15)
BUN: 16 mg/dL (ref 6–20)
CALCIUM: 8.9 mg/dL (ref 8.9–10.3)
CO2: 29 mmol/L (ref 22–32)
Chloride: 103 mmol/L (ref 101–111)
Creatinine, Ser: 0.73 mg/dL (ref 0.44–1.00)
GLUCOSE: 150 mg/dL — AB (ref 65–99)
Potassium: 4 mmol/L (ref 3.5–5.1)
SODIUM: 139 mmol/L (ref 135–145)

## 2015-10-03 LAB — CBC
HCT: 33.5 % — ABNORMAL LOW (ref 36.0–46.0)
Hemoglobin: 10.7 g/dL — ABNORMAL LOW (ref 12.0–15.0)
MCH: 30 pg (ref 26.0–34.0)
MCHC: 31.9 g/dL (ref 30.0–36.0)
MCV: 93.8 fL (ref 78.0–100.0)
PLATELETS: 385 10*3/uL (ref 150–400)
RBC: 3.57 MIL/uL — ABNORMAL LOW (ref 3.87–5.11)
RDW: 14.2 % (ref 11.5–15.5)
WBC: 16.2 10*3/uL — AB (ref 4.0–10.5)

## 2015-10-03 LAB — C DIFFICILE QUICK SCREEN W PCR REFLEX
C DIFFICLE (CDIFF) ANTIGEN: NEGATIVE
C Diff interpretation: NEGATIVE
C Diff toxin: NEGATIVE

## 2015-10-03 LAB — GLUCOSE, CAPILLARY
GLUCOSE-CAPILLARY: 240 mg/dL — AB (ref 65–99)
GLUCOSE-CAPILLARY: 113 mg/dL — AB (ref 65–99)
Glucose-Capillary: 156 mg/dL — ABNORMAL HIGH (ref 65–99)
Glucose-Capillary: 118 mg/dL — ABNORMAL HIGH (ref 65–99)

## 2015-10-03 MED ORDER — POLYVINYL ALCOHOL 1.4 % OP SOLN
1.0000 [drp] | OPHTHALMIC | Status: DC | PRN
  Administered 2015-10-03: 1 [drp] via OPHTHALMIC
  Filled 2015-10-03: qty 15

## 2015-10-03 MED ORDER — LOPERAMIDE HCL 2 MG PO CAPS
2.0000 mg | ORAL_CAPSULE | Freq: Two times a day (BID) | ORAL | Status: DC | PRN
  Administered 2015-10-04: 2 mg via ORAL
  Filled 2015-10-03: qty 1

## 2015-10-03 NOTE — Progress Notes (Signed)
PROGRESS NOTE  [name]  [id number] DOB: 1927-04-19  DOA: 09/27/2015 PCP: Ria Bush, MD   Brief Narrative:  82 year old female, from home, PMH of HTN, CAD, CABG, chronic diastolic CHF, HLD, DM 2, PAD presented to [facility] ED on 09/27/15 with reported fever, chills, generalized weakness, altered mental status, dry cough, dyspnea and multiple falls. In ED and noted to be in A. fib with RVR, CT head and neck unremarkable. Admitted for presumed UTI many A. fib with RVR. Cardiology following. Has reverted to sinus rhythm. Deemed not a candidate for long-term anticoagulation due to fall risk. New-onset diarrhea 6/30. Awaiting PT evaluation. Not yet medically stable for discharge.   Assessment & Plan:   Active Problems:   Coronary artery disease   Anxiety   Hypertension   Diastolic dysfunction   Hyperlipidemia   Peptic ulcer disease   Dysphagia   Diabetic polyneuropathy (HCC)   Venous ulcer of leg (HCC)   Diabetes mellitus type 2 with peripheral artery disease (HCC)   Chronic venous insufficiency   Malnutrition of moderate degree   Acute respiratory failure (HCC)   Acute on chronic diastolic heart failure (HCC)   Persistent atrial fibrillation (HCC)   Paroxysmal atrial fibrillation (HCC)   Urinary tract infection, site not specified   New onset A. fib with RVR - Cardiology following. - Initially treated with Cardizem drip and IV heparin. Now off of both. - She had reverted to sinus rhythm but went back to A. fib with RVR in the 120s-130s on 6/28. Cardiology increased metoprolol dose. Patient reverted to sinus rhythm again and has remained in sinus rhythm thus far. - Continue aspirin. As per prior records, not a good candidate for systemic anticoagulation due to fall risk.  Acute on chronic diastolic CHF - Precipitated by A. fib with RVR. - Echo 09/28/15: Normal EF and moderate LVH. - Diuresed and now compensated. Continue home dose of Lasix. DC'ed Foley catheter which was  placed for monitoring of urine output.  Elevated troponin - Per cardiology. Felt to be related to demand ischemia. Chest pain overnight was atypical,? Related to anxiety. EKG without acute changes. Minimally elevated troponin with flat trend.  CAD status post CABG - No reported chest pain.  DM 2 - A1c: 7.1. Continue SSI. Fluctuating.  Hyperlipidemia - Continue Pravachol  PAD/bilateral varicosities and bilateral resting leg pain - S/p ablation of the left greater saphenous vein for painful varicosities two years ago.  - Vascular were consulted on admission by EDP, who recommended getting ABI and to call them if needed.  - ABI suggests bilateral moderate arterial insufficiency at rest. As per vascular surgery consultation notes, her thigh pain is not related to PAD. Patient's history is not very reliable. 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 No acute signs on physical exam. Ambulated with PT.  Escherichia coli UTI - Initially treated with IV Rocephin now changed to oral Keflex. Day 6/7 antibiotics thus far. No urinary symptoms reported. New onset diarrhea on 6/30. Persisting leukocytosis which is slightly increased from 16 >19.4 over the last 3 days. - Due to ongoing diarrhea and patient already completed 6 days of antibiotics without features of ongoing acute UTI (afebrile and no urinary symptoms), discontinued Keflex 7/1. Completed treatment.  Diarrhea - Noted 6/30. Had 7 loose stools in the last 24 hours. Probiotics added 6/30. Due to recent antibiotic exposure, leukocytosis, questionable abdominal discomfort, requested C. difficile PCR & negative. Discontinued Keflex 7/1. Monitor for now.  Acute encephalopathy - Likely related to acute illness including UTI. Seems to have improved. As per discussion with patient's son on 6/29, he has been visiting her couple times daily and indicates that her mental status is significantly improved compared to initial  admission. She seems to have baseline mild cognitive impairment and possible dementia.  Anemia - Stable  Acute kidney injury - Resolved.  Leukocytosis - May be related to UTI. Consider testing for C. difficile if diarrhea persists or worsens. No evidence of skin breakdown on detailed exam with patient's RN on 6/30. Minimal redness at the wrist IV site does not look significantly infected. Continue topical Bactroban and monitor. Better than yesterday.   DVT prophylaxis: Subcutaneous heparin Code Status: Full Family Communication: No family at bedside. Discussed with son Mr. Novalyn Flott on 6/29 & updated care and answered questions. Disposition Plan: DC when medically stable. Patient declines SNF. Family aware. She lives with 2 elderly gentleman and her son apparently lives just behind her house and will be able to provide supervision and assistance.  Consultants:   Cardiology  Vascular surgery  Procedures:    Foley catheter-discontinue 6/30  Antimicrobials:   IV Rocephin 6/25 > 6/26  Oral Keflex 6/27 > 7/1.   Subjective: Overnight events noted. 6 loose stools yesterday and one this morning. Witnessed stool) this morning with are in-loose, small and dark green color (not melena as per night RN's documentation) and no blood. Keeps asking what strong with her-this has been explained to her several times over the last few days. No acute symptoms. As per RN, no other acute issues.  Objective:  Filed Vitals:   10/02/15 1325 10/02/15 2028 10/02/15 2227 10/03/15 0232  BP: 126/58 136/64 147/58 129/46  Pulse: 63 69 70 68  Temp: 98.3 F (36.8 C) 98.5 F (36.9 C)  98.3 F (36.8 C)  TempSrc: Oral Oral  Oral  Resp: 19 19  19   Height:      Weight:      SpO2: 98% 95%  93%    Intake/Output Summary (Last 24 hours) at 10/03/15 0839 Last data filed at 10/03/15 0232  Gross per 24 hour  Intake    920 ml  Output      9 ml  Net    911 ml   Filed Weights   09/30/15 0405 10/01/15  0704 10/02/15 0304  Weight: 65.8 kg (145 lb 1 oz) 67.858 kg (149 lb 9.6 oz) 63.866 kg (140 lb 12.8 oz)    Examination:  General exam: Pleasant elderly female sitting up comfAt edge of bed this morning. Respiratory system: Clear to auscultation. Respiratory effort normal. Cardiovascular system: S1 & S2 heard, RRR. No JVD, murmurs, rubs, gallops or clicks. No pedal edema. Telemetry: Sinus rhythm.  Gastrointestinal system: Abdomen is nondistended, soft and nontender. No organomegaly or masses felt. Normal bowel sounds heard. Central nervous system: Alert and oriented 2. No focal neurological deficits. Extremities: Symmetric 5 x 5 power. Diminished bilateral dorsalis pedis pulsations.  Skin: Examined in detail with patient's female and nursing aide at bedside. Apart from couple of old small superficial bruises over left mid back and one over right medial mid thigh, no other acute findings.  Psychiatry: Judgement and insight appear impaired. Mood & affect appropriate.     Data Reviewed: I have personally reviewed following labs and imaging studies  CBC:  Recent Labs Lab 09/27/15 1434 09/28/15 0155 09/29/15 0254 10/01/15 0214 10/02/15 0340 10/03/15 0630  WBC 18.5* 19.8* 16.0* 18.8* 19.4* 16.2*  NEUTROABS 15.7*  --   --   --   --   --   HGB 12.7 11.8* 11.5* 11.2* 10.8* 10.7*  HCT 37.9 36.0 36.1 34.4* 34.3* 33.5*  MCV 90.5 90.0 90.3 92.2 94.8 93.8  PLT 172 175 193 292 327 0000000   Basic Metabolic Panel:  Recent Labs Lab 09/28/15 0545 09/30/15 0330 10/01/15 0214 10/02/15 0340 10/03/15 0630  NA 137 136 137 138 139  K 3.8 3.6 3.7 4.0 4.0  CL 105 101 102 103 103  CO2 21* 26 26 28 29   GLUCOSE 163* 160* 132* 142* 150*  BUN 43* 32* 27* 25* 16  CREATININE 1.27* 0.90 0.94 1.00 0.73  CALCIUM 8.2* 8.2* 8.5* 8.6* 8.9   GFR: Estimated Creatinine Clearance: 44.3 mL/min (by C-G formula based on Cr of 0.73). Liver Function Tests:  Recent Labs Lab 09/27/15 1434  AST 22  ALT 20    ALKPHOS 80  BILITOT 0.8  PROT 6.0*  ALBUMIN 2.2*   No results for input(s): LIPASE, AMYLASE in the last 168 hours. No results for input(s): AMMONIA in the last 168 hours. Coagulation Profile: No results for input(s): INR, PROTIME in the last 168 hours. Cardiac Enzymes:  Recent Labs Lab 09/28/15 0052 09/28/15 0553 10/01/15 0942 10/01/15 1256 10/01/15 1856  TROPONINI 0.03 0.05* 0.05* 0.04* 0.03*   BNP (last 3 results) No results for input(s): PROBNP in the last 8760 hours. HbA1C: No results for input(s): HGBA1C in the last 72 hours. CBG:  Recent Labs Lab 10/02/15 0628 10/02/15 1125 10/02/15 1621 10/02/15 2222 10/03/15 0630  GLUCAP 155* 201* 94 122* 156*   Lipid Profile: No results for input(s): CHOL, HDL, LDLCALC, TRIG, CHOLHDL, LDLDIRECT in the last 72 hours. Thyroid Function Tests: No results for input(s): TSH, T4TOTAL, [name]4, T3FREE, THYROIDAB in the last 72 hours. Anemia Panel: No results for input(s): VITAMINB12, FOLATE, FERRITIN, TIBC, IRON, RETICCTPCT in the last 72 hours.  Sepsis Labs:  Recent Labs Lab 09/27/15 1436  LATICACIDVEN 1.12    Recent Results (from the past 240 hour(s))  Urine culture     Status: Abnormal   Collection Time: 09/27/15  3:20 PM  Result Value Ref Range Status   Specimen Description URINE, CLEAN CATCH  Final   Special Requests NONE  Final   Culture >=100,000 COLONIES/mL ESCHERICHIA COLI (A)  Final   Report Status 09/29/2015 FINAL  Final   Organism ID, Bacteria ESCHERICHIA COLI (A)  Final      Susceptibility   Escherichia coli - MIC*    AMPICILLIN >=32 RESISTANT Resistant     CEFAZOLIN <=4 SENSITIVE Sensitive     CEFTRIAXONE <=1 SENSITIVE Sensitive     CIPROFLOXACIN <=0.25 SENSITIVE Sensitive     GENTAMICIN <=1 SENSITIVE Sensitive     IMIPENEM <=0.25 SENSITIVE Sensitive     NITROFURANTOIN <=16 SENSITIVE Sensitive     TRIMETH/SULFA >=320 RESISTANT Resistant     AMPICILLIN/SULBACTAM 16 INTERMEDIATE Intermediate      PIP/TAZO <=4 SENSITIVE Sensitive     * >=100,000 COLONIES/mL ESCHERICHIA COLI  MRSA PCR Screening     Status: None   Collection Time: 09/27/15 10:29 PM  Result Value Ref Range Status   MRSA by PCR NEGATIVE NEGATIVE Final    Comment:        The GeneXpert MRSA Assay (FDA approved for NASAL specimens only), is one component of a comprehensive MRSA colonization surveillance program. It is not intended to diagnose MRSA infection nor to guide or monitor treatment for MRSA infections.  C difficile quick scan w PCR reflex     Status: None   Collection Time: 10/03/15  8:04 AM  Result Value Ref Range Status   C Diff antigen NEGATIVE NEGATIVE Final   C Diff toxin NEGATIVE NEGATIVE Final   C Diff interpretation Negative for toxigenic C. difficile  Final         Radiology Studies: No results found.      Scheduled Meds: . antiseptic oral rinse  7 mL Mouth Rinse BID  . aspirin EC  81 mg Oral Daily  . ferrous sulfate  325 mg Oral Q breakfast  . furosemide  20 mg Oral Daily  . heparin subcutaneous  5,000 Units Subcutaneous Q8H  . insulin aspart  0-15 Units Subcutaneous TID WC  . insulin aspart  0-5 Units Subcutaneous QHS  . metoprolol tartrate  37.5 mg Oral BID  . mupirocin ointment   Nasal BID  . pantoprazole  40 mg Oral Daily  . pravastatin  20 mg Oral QHS  . saccharomyces boulardii  250 mg Oral BID  . traZODone  150 mg Oral QHS   Continuous Infusions:    LOS: 6 days    Time spent: 30 minutes.    [facility], MD [facility] Pager [phone]  If 7PM-7AM, please contact night-coverage [email or web address] Password [facility] 10/03/2015, 8:39 AM

## 2015-10-03 NOTE — Progress Notes (Signed)
Pt has passed loose stools twice tonight with no incontinence.  Stools appear to be black.  Will continue to monitor pt. Lupita [name], RN

## 2015-10-03 NOTE — Progress Notes (Signed)
After third loose stool tonight, the hospitalist was informed and asked to start C. Diff protocol.  Pt is now on enteric precautions to rule out for C. Diff.  Because of dark stools, a hemoccult was ordered will continue to monitor pt. Lupita [name], RN

## 2015-10-04 LAB — GLUCOSE, CAPILLARY
GLUCOSE-CAPILLARY: 149 mg/dL — AB (ref 65–99)
GLUCOSE-CAPILLARY: 193 mg/dL — AB (ref 65–99)
Glucose-Capillary: 139 mg/dL — ABNORMAL HIGH (ref 65–99)
Glucose-Capillary: 99 mg/dL (ref 65–99)

## 2015-10-04 MED ORDER — LOPERAMIDE HCL 2 MG PO CAPS: 2.0000 mg | ORAL_CAPSULE | Freq: Three times a day (TID) | ORAL | Status: DC | PRN

## 2015-10-04 NOTE — Progress Notes (Signed)
PROGRESS NOTE  [name]  [id number] DOB: July 06, 1927  DOA: 09/27/2015 PCP: Ria Bush, MD   Brief Narrative:  82 year old female, from home, PMH of HTN, CAD, CABG, chronic diastolic CHF, HLD, DM 2, PAD presented to [facility] on 09/27/15 with reported fever, chills, generalized weakness, altered mental status, dry cough, dyspnea and multiple falls. In ED and noted to be in A. fib with RVR, CT head and neck unremarkable. Admitted for presumed UTI many A. fib with RVR. Cardiology following. Has reverted to sinus rhythm. Deemed not a candidate for long-term anticoagulation due to fall risk. New-onset diarrhea 6/30. Not yet medically ready for discharge.   Assessment & Plan:   Active Problems:   Coronary artery disease   Anxiety   Hypertension   Diastolic dysfunction   Hyperlipidemia   Peptic ulcer disease   Dysphagia   Diabetic polyneuropathy (HCC)   Venous ulcer of leg (HCC)   Diabetes mellitus type 2 with peripheral artery disease (HCC)   Chronic venous insufficiency   Malnutrition of moderate degree   Acute respiratory failure (HCC)   Acute on chronic diastolic heart failure (HCC)   Persistent atrial fibrillation (HCC)   Paroxysmal atrial fibrillation (HCC)   Urinary tract infection, site not specified   New onset A. fib with RVR - Cardiology following. - Initially treated with Cardizem drip and IV heparin. Now off of both. - She had reverted to sinus rhythm but went back to A. fib with RVR in the 120s-130s on 6/28. Cardiology increased metoprolol dose. Patient reverted to sinus rhythm again and has remained in sinus rhythm thus far. - Continue aspirin. As per prior records, not a good candidate for systemic anticoagulation due to fall risk.  Acute on chronic diastolic CHF - Precipitated by A. fib with RVR. - Echo 09/28/15: Normal EF and moderate LVH. - Diuresed and now compensated. Continue home dose of Lasix. DC'ed Foley catheter which was placed for monitoring of  urine output.  Elevated troponin - Per cardiology. Felt to be related to demand ischemia. Chest pain overnight was atypical,? Related to anxiety. EKG without acute changes. Minimally elevated troponin with flat trend.  CAD status post CABG - No reported chest pain.  DM 2 - A1c: 7.1. Continue SSI. Fluctuating.  Hyperlipidemia - Continue Pravachol  PAD/bilateral varicosities and bilateral resting leg pain - S/p ablation of the left greater saphenous vein for painful varicosities two years ago.  - Vascular were consulted on admission by EDP, who recommended getting ABI and to call them if needed.  - ABI suggests bilateral moderate arterial insufficiency at rest. As per vascular surgery consultation notes, her thigh pain is not related to PAD. Patient's history is not very reliable. 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 No acute signs on physical exam. Ambulated with PT.  Escherichia coli UTI - Initially treated with IV Rocephin now changed to oral Keflex. Day 6/7 antibiotics thus far. No urinary symptoms reported. New onset diarrhea on 6/30. Persisting leukocytosis which is slightly increased from 16 >19.4 over the last 3 days. - Due to ongoing diarrhea and patient already completed 6 days of antibiotics without features of ongoing acute UTI (afebrile and no urinary symptoms), discontinued Keflex 7/1. Completed treatment.  Diarrhea - Noted 6/30. Probiotics added 6/30. Due to recent antibiotic exposure, leukocytosis, questionable abdominal discomfort, requested C. difficile PCR & negative. Discontinued Keflex 7/1. Monitor for now. - Diarrhea was better yesterday afternoon but had 3 episodes again last  night. As per patient, has intermittent loose stools at home and takes Imodium when necessary. - Discussed with RN, treat symptomatically with Imodium and DC when loose stools are better.  Acute encephalopathy - Likely related to acute illness including UTI.  Seems to have improved. As per discussion with patient's son on 6/29, he has been visiting her couple times daily and indicates that her mental status is significantly improved compared to initial admission. She seems to have baseline mild cognitive impairment and possible dementia.  Anemia - Stable  Acute kidney injury - Resolved.  Leukocytosis - May be related to UTI. Consider testing for C. difficile if diarrhea persists or worsens. No evidence of skin breakdown on detailed exam with patient's RN on 6/30. Minimal redness at the wrist IV site does not look significantly infected. Continue topical Bactroban and monitor. Better than yesterday.   DVT prophylaxis: Subcutaneous heparin Code Status: Full Family Communication: No family at bedside. Discussed with son Mr. Dayshawna Manspeaker on 6/29 & updated care and answered questions. Disposition Plan: DC when medically ready-possibly 7/3. Patient declines SNF. Family aware. She lives with 2 elderly gentleman and her son apparently lives just behind her house and will be able to provide supervision and assistance.  Consultants:   Cardiology  Vascular surgery  Procedures:    Foley catheter-discontinue 6/30  Antimicrobials:   IV Rocephin 6/25 > 6/26  Oral Keflex 6/27 > 7/1.   Subjective: Diarrhea was better yesterday afternoon but had 3 episodes last night.  Objective:  Filed Vitals:   10/03/15 0232 10/03/15 1447 10/03/15 1944 10/04/15 0459  BP: 129/46 127/52 139/61 153/68  Pulse: 68 65 77 70  Temp: 98.3 F (36.8 C) 98.2 F (36.8 C) 98.4 F (36.9 C) 98.4 F (36.9 C)  TempSrc: Oral Oral Oral Oral  Resp: 19 18 18 18   Height:      Weight:    67.903 kg (149 lb 11.2 oz)  SpO2: 93% 96% 96% 93%    Intake/Output Summary (Last 24 hours) at 10/04/15 1014 Last data filed at 10/04/15 0800  Gross per 24 hour  Intake    480 ml  Output      0 ml  Net    480 ml   Filed Weights   10/01/15 0704 10/02/15 0304 10/04/15 0459  Weight:  67.858 kg (149 lb 9.6 oz) 63.866 kg (140 lb 12.8 oz) 67.903 kg (149 lb 11.2 oz)    Examination:  General exam: Pleasant elderly female sitting up comfortably. Respiratory system: Clear to auscultation. Respiratory effort normal. Cardiovascular system: S1 & S2 heard, RRR. No JVD, murmurs, rubs, gallops or clicks. No pedal edema. Telemetry: Sinus rhythm.  Gastrointestinal system: Abdomen is nondistended, soft and nontender. No organomegaly or masses felt. Normal bowel sounds heard. Central nervous system: Alert and oriented 2. No focal neurological deficits. Extremities: Symmetric 5 x 5 power. Diminished bilateral dorsalis pedis pulsations.  Skin: Examined in detail with patient's female and nursing aide at bedside. Apart from couple of old small superficial bruises over left mid back and one over right medial mid thigh, no other acute findings.  Psychiatry: Judgement and insight appear impaired. Mood & affect appropriate.     Data Reviewed: I have personally reviewed following labs and imaging studies  CBC:  Recent Labs Lab 09/27/15 1434 09/28/15 0155 09/29/15 0254 10/01/15 0214 10/02/15 0340 10/03/15 0630  WBC 18.5* 19.8* 16.0* 18.8* 19.4* 16.2*  NEUTROABS 15.7*  --   --   --   --   --  HGB 12.7 11.8* 11.5* 11.2* 10.8* 10.7*  HCT 37.9 36.0 36.1 34.4* 34.3* 33.5*  MCV 90.5 90.0 90.3 92.2 94.8 93.8  PLT 172 175 193 292 327 0000000   Basic Metabolic Panel:  Recent Labs Lab 09/28/15 0545 09/30/15 0330 10/01/15 0214 10/02/15 0340 10/03/15 0630  NA 137 136 137 138 139  K 3.8 3.6 3.7 4.0 4.0  CL 105 101 102 103 103  CO2 21* 26 26 28 29   GLUCOSE 163* 160* 132* 142* 150*  BUN 43* 32* 27* 25* 16  CREATININE 1.27* 0.90 0.94 1.00 0.73  CALCIUM 8.2* 8.2* 8.5* 8.6* 8.9   GFR: Estimated Creatinine Clearance: 45.6 mL/min (by C-G formula based on Cr of 0.73). Liver Function Tests:  Recent Labs Lab 09/27/15 1434  AST 22  ALT 20  ALKPHOS 80  BILITOT 0.8  PROT 6.0*  ALBUMIN  2.2*   No results for input(s): LIPASE, AMYLASE in the last 168 hours. No results for input(s): AMMONIA in the last 168 hours. Coagulation Profile: No results for input(s): INR, PROTIME in the last 168 hours. Cardiac Enzymes:  Recent Labs Lab 09/28/15 0052 09/28/15 0553 10/01/15 0942 10/01/15 1256 10/01/15 1856  TROPONINI 0.03 0.05* 0.05* 0.04* 0.03*   BNP (last 3 results) No results for input(s): PROBNP in the last 8760 hours. HbA1C: No results for input(s): HGBA1C in the last 72 hours. CBG:  Recent Labs Lab 10/03/15 0630 10/03/15 1103 10/03/15 1620 10/03/15 2132 10/04/15 0648  GLUCAP 156* 240* 113* 118* 149*   Lipid Profile: No results for input(s): CHOL, HDL, LDLCALC, TRIG, CHOLHDL, LDLDIRECT in the last 72 hours. Thyroid Function Tests: No results for input(s): TSH, T4TOTAL, [name]4, T3FREE, THYROIDAB in the last 72 hours. Anemia Panel: No results for input(s): VITAMINB12, FOLATE, FERRITIN, TIBC, IRON, RETICCTPCT in the last 72 hours.  Sepsis Labs:  Recent Labs Lab 09/27/15 1436  LATICACIDVEN 1.12    Recent Results (from the past 240 hour(s))  Urine culture     Status: Abnormal   Collection Time: 09/27/15  3:20 PM  Result Value Ref Range Status   Specimen Description URINE, CLEAN CATCH  Final   Special Requests NONE  Final   Culture >=100,000 COLONIES/mL ESCHERICHIA COLI (A)  Final   Report Status 09/29/2015 FINAL  Final   Organism ID, Bacteria ESCHERICHIA COLI (A)  Final      Susceptibility   Escherichia coli - MIC*    AMPICILLIN >=32 RESISTANT Resistant     CEFAZOLIN <=4 SENSITIVE Sensitive     CEFTRIAXONE <=1 SENSITIVE Sensitive     CIPROFLOXACIN <=0.25 SENSITIVE Sensitive     GENTAMICIN <=1 SENSITIVE Sensitive     IMIPENEM <=0.25 SENSITIVE Sensitive     NITROFURANTOIN <=16 SENSITIVE Sensitive     TRIMETH/SULFA >=320 RESISTANT Resistant     AMPICILLIN/SULBACTAM 16 INTERMEDIATE Intermediate     PIP/TAZO <=4 SENSITIVE Sensitive     * >=100,000  COLONIES/mL ESCHERICHIA COLI  MRSA PCR Screening     Status: None   Collection Time: 09/27/15 10:29 PM  Result Value Ref Range Status   MRSA by PCR NEGATIVE NEGATIVE Final    Comment:        The GeneXpert MRSA Assay (FDA approved for NASAL specimens only), is one component of a comprehensive MRSA colonization surveillance program. It is not intended to diagnose MRSA infection nor to guide or monitor treatment for MRSA infections.   C difficile quick scan w PCR reflex     Status: None   Collection Time: 10/03/15  8:04 AM  Result Value Ref Range Status   C Diff antigen NEGATIVE NEGATIVE Final   C Diff toxin NEGATIVE NEGATIVE Final   C Diff interpretation Negative for toxigenic C. difficile  Final         Radiology Studies: No results found.      Scheduled Meds: . antiseptic oral rinse  7 mL Mouth Rinse BID  . aspirin EC  81 mg Oral Daily  . ferrous sulfate  325 mg Oral Q breakfast  . furosemide  20 mg Oral Daily  . heparin subcutaneous  5,000 Units Subcutaneous Q8H  . insulin aspart  0-15 Units Subcutaneous TID WC  . insulin aspart  0-5 Units Subcutaneous QHS  . metoprolol tartrate  37.5 mg Oral BID  . mupirocin ointment   Nasal BID  . pantoprazole  40 mg Oral Daily  . pravastatin  20 mg Oral QHS  . saccharomyces boulardii  250 mg Oral BID  . traZODone  150 mg Oral QHS   Continuous Infusions:    LOS: 7 days    Time spent: 15 minutes.    [facility], MD [facility] Pager [phone]  If 7PM-7AM, please contact night-coverage [email or web address] Password [email or web address] 10/04/2015, 10:14 AM

## 2015-10-05 LAB — CBC
HCT: 33.5 % — ABNORMAL LOW (ref 36.0–46.0)
Hemoglobin: 10.9 g/dL — ABNORMAL LOW (ref 12.0–15.0)
MCH: 30.4 pg (ref 26.0–34.0)
MCHC: 32.5 g/dL (ref 30.0–36.0)
MCV: 93.6 fL (ref 78.0–100.0)
PLATELETS: 399 10*3/uL (ref 150–400)
RBC: 3.58 MIL/uL — ABNORMAL LOW (ref 3.87–5.11)
RDW: 14 % (ref 11.5–15.5)
WBC: 14.7 10*3/uL — ABNORMAL HIGH (ref 4.0–10.5)

## 2015-10-05 LAB — BASIC METABOLIC PANEL
Anion gap: 7 (ref 5–15)
BUN: 14 mg/dL (ref 6–20)
CALCIUM: 8.9 mg/dL (ref 8.9–10.3)
CO2: 27 mmol/L (ref 22–32)
CREATININE: 0.81 mg/dL (ref 0.44–1.00)
Chloride: 104 mmol/L (ref 101–111)
GFR calc Af Amer: >60 mL/min (ref >60)
GLUCOSE: 131 mg/dL — AB (ref 65–99)
Potassium: 4 mmol/L (ref 3.5–5.1)
Sodium: 138 mmol/L (ref 135–145)

## 2015-10-05 LAB — GLUCOSE, CAPILLARY
Glucose-Capillary: 152 mg/dL — ABNORMAL HIGH (ref 65–99)
Glucose-Capillary: 171 mg/dL — ABNORMAL HIGH (ref 65–99)

## 2015-10-05 MED ORDER — METOPROLOL TARTRATE 37.5 MG PO TABS: 37.5000 mg | ORAL_TABLET | Freq: Two times a day (BID) | ORAL | Status: DC

## 2015-10-05 NOTE — Progress Notes (Signed)
Spoke w pt. She has bsc and rw at home. She lives w 80 yr old boyfriend. Does have son. She refuses snf and will be agreeable to hhc. Went over [employer] and no pref. Ref to [facility] w [facility] for hhrn, hhpt and bath aid. Poss dc today.

## 2015-10-05 NOTE — Progress Notes (Signed)
Subjective:  Feeling much better. Clinically improved  Objective:  Temp:  [98.1 F (36.7 C)-98.8 F (37.1 C)] 98.1 F (36.7 C) (07/03 0622) Pulse Rate:  [62-80] 62 (07/03 0622) Resp:  [16-20] 16 (07/03 0622) BP: (121-152)/(52-56) 152/56 mmHg (07/03 0622) SpO2:  [94 %-96 %] 94 % (07/03 0622) Weight:  [148 lb 11.2 oz (67.45 kg)] 148 lb 11.2 oz (67.45 kg) (07/03 0622) Weight change: -1 lb (-0.454 kg)  Intake/Output from previous day: 07/02 0701 - 07/03 0700 In: 720 [P.O.:720] Out: 1 [Stool:1]  Intake/Output from this shift: Total I/O In: 240 [P.O.:240] Out: -   Physical Exam: General appearance: alert and no distress Neck: no adenopathy, no carotid bruit, no JVD, supple, symmetrical, trachea midline and thyroid not enlarged, symmetric, no tenderness/mass/nodules Lungs: clear to auscultation bilaterally Heart: regular rate and rhythm, S1, S2 normal, no murmur, click, rub or gallop Extremities: extremities normal, atraumatic, no cyanosis or edema  Lab Results: Results for orders placed or performed during the hospital encounter of 09/27/15 (from the past 48 hour(s))  Glucose, capillary     Status: Abnormal   Collection Time: 10/03/15 11:03 AM  Result Value Ref Range   Glucose-Capillary 240 (H) 65 - 99 mg/dL   Comment 1 Notify RN    Comment 2 Document in Chart   Glucose, capillary     Status: Abnormal   Collection Time: 10/03/15  4:20 PM  Result Value Ref Range   Glucose-Capillary 113 (H) 65 - 99 mg/dL   Comment 1 Notify RN    Comment 2 Document in Chart   Glucose, capillary     Status: Abnormal   Collection Time: 10/03/15  9:32 PM  Result Value Ref Range   Glucose-Capillary 118 (H) 65 - 99 mg/dL  Glucose, capillary     Status: Abnormal   Collection Time: 10/04/15  6:48 AM  Result Value Ref Range   Glucose-Capillary 149 (H) 65 - 99 mg/dL  Glucose, capillary     Status: Abnormal   Collection Time: 10/04/15 10:57 AM  Result Value Ref Range   Glucose-Capillary  193 (H) 65 - 99 mg/dL   Comment 1 Notify RN    Comment 2 Document in Chart   Glucose, capillary     Status: Abnormal   Collection Time: 10/04/15  4:21 PM  Result Value Ref Range   Glucose-Capillary 139 (H) 65 - 99 mg/dL   Comment 1 Notify RN    Comment 2 Document in Chart   Glucose, capillary     Status: None   Collection Time: 10/04/15  9:20 PM  Result Value Ref Range   Glucose-Capillary 99 65 - 99 mg/dL  CBC     Status: Abnormal   Collection Time: 10/05/15  2:24 AM  Result Value Ref Range   WBC 14.7 (H) 4.0 - 10.5 K/uL   RBC 3.58 (L) 3.87 - 5.11 MIL/uL   Hemoglobin 10.9 (L) 12.0 - 15.0 g/dL   HCT 33.5 (L) 36.0 - 46.0 %   MCV 93.6 78.0 - 100.0 fL   MCH 30.4 26.0 - 34.0 pg   MCHC 32.5 30.0 - 36.0 g/dL   RDW 14.0 11.5 - 15.5 %   Platelets 399 150 - 400 K/uL  Basic metabolic panel     Status: Abnormal   Collection Time: 10/05/15  2:24 AM  Result Value Ref Range   Sodium 138 135 - 145 mmol/L   Potassium 4.0 3.5 - 5.1 mmol/L   Chloride 104 101 - 111  mmol/L   CO2 27 22 - 32 mmol/L   Glucose, Bld 131 (H) 65 - 99 mg/dL   BUN 14 6 - 20 mg/dL   Creatinine, Ser 0.81 0.44 - 1.00 mg/dL   Calcium 8.9 8.9 - 10.3 mg/dL   GFR calc non Af Amer >60 >60 mL/min   GFR calc Af Amer >60 >60 mL/min    Comment: (NOTE) The eGFR has been calculated using the CKD EPI equation. This calculation has not been validated in all clinical situations. eGFR's persistently <60 mL/min signify possible Chronic Kidney Disease.    Anion gap 7 5 - 15  Glucose, capillary     Status: Abnormal   Collection Time: 10/05/15  6:20 AM  Result Value Ref Range   Glucose-Capillary 152 (H) 65 - 99 mg/dL    Imaging: Imaging results have been reviewed  Tele- NSR  Assessment/Plan:   1. Active Problems: 2.   Coronary artery disease 3.   Anxiety 4.   Hypertension 5.   Diastolic dysfunction 6.   Hyperlipidemia 7.   Peptic ulcer disease 8.   Dysphagia 9.   Diabetic polyneuropathy ([place]) 10.   Venous ulcer of leg  ([place]) 11.   Diabetes mellitus type 2 with peripheral artery disease ([place]) 12.   Chronic venous insufficiency 13.   Malnutrition of moderate degree 14.   Acute respiratory failure ([place]) 15.   Acute on chronic diastolic heart failure ([place]) 16.   Persistent atrial fibrillation ([place]) 17.   Paroxysmal atrial fibrillation ([facility]) 18.   Urinary tract infection, site not specified 19.   Time Spent Directly with Patient:  20 minutes  Length of Stay:  LOS: 8 days   Admitted with UTI. Had PAF and diastolic HF. 2D nl EF. H/O remote CABG. Diuresed. Deemed not an [place] candidate. She has an appointment with Dr Acie Fredrickson already scheduled on 7/17. No further recommendations. Will see again as needed.  Quay Burow 10/05/2015, 9:05 AM

## 2015-10-05 NOTE — Progress Notes (Signed)
Physical Therapy Treatment Patient Details Name: [name] MRN: [id number] DOB: 1927-10-27 Today's Date: 10/05/2015    History of Present Illness 82 y.o. female admitted for AMS, fever, chills, general malaise, and UTI. Pt developed afib with RVR. PMH significant for HTN, HLD, CAD, CABG (1996), afib, dysphagia, diabetic polyneuropathy, DM type II, PUD, DVT of RLE.    PT Comments    Pt continues to refuse rehab ([facility]). She is at a supervision level with ambulation using RW but requires mod assist for sit to stand. She has family and her boyfriend to provide assist at home. Pt with high anxiety and talks is circles. Unsure if family able to provide 24-hour assist and at what assist level.  Follow Up Recommendations  Home health PT;Supervision/Assistance - 24 hour     Equipment Recommendations  None recommended by PT    Recommendations for Other Services       Precautions / Restrictions Precautions Precautions: Fall Precaution Comments: multiple falls at home Restrictions Weight Bearing Restrictions: No    Mobility  Bed Mobility               General bed mobility comments: Pt in recliner upon arrival.  Transfers   Equipment used: Rolling walker (2 wheeled)   Sit to Stand: Mod assist Stand pivot transfers: Mod assist       General transfer comment: Assist to power up. Verbal cues for hand placement.  Ambulation/Gait Ambulation/Gait assistance: Supervision Ambulation Distance (Feet): 180 Feet Assistive device: Rolling walker (2 wheeled) Gait Pattern/deviations: Step-through pattern;Decreased stride length Gait velocity: decreased   General Gait Details: No LOB or SOB noted with ambulation.   Stairs            Wheelchair Mobility    Modified Rankin (Stroke Patients Only)       Balance   Sitting-balance support: No upper extremity supported;Feet supported Sitting balance-Leahy Scale: Good     Standing balance support: Bilateral upper  extremity supported;During functional activity Standing balance-Leahy Scale: Fair                      Cognition Arousal/Alertness: Awake/alert Behavior During Therapy: Anxious Overall Cognitive Status: Within Functional Limits for tasks assessed                      Exercises      General Comments        Pertinent Vitals/Pain Pain Assessment: No/denies pain    Home Living                      Prior Function            PT Goals (current goals can now be found in the care plan section) Acute Rehab PT Goals Patient Stated Goal: to go home PT Goal Formulation: With patient Time For Goal Achievement: 10/06/15 Potential to Achieve Goals: Good Progress towards PT goals: Progressing toward goals    Frequency  Min 3X/week    PT Plan Current plan remains appropriate    Co-evaluation             End of Session Equipment Utilized During Treatment: Gait belt Activity Tolerance: Patient tolerated treatment well Patient left: in chair;with call bell/phone within reach     Time: [id number] PT Time Calculation (min) (ACUTE ONLY): 20 min  Charges:  $Gait Training: 8-22 mins  G Codes:      Lorriane Shire 10/05/2015, 9:59 AM

## 2015-10-05 NOTE — Discharge Summary (Signed)
Physician Discharge Summary  [name] [id number] DOB: Mar 16, 1928  PCP: Ria Bush, MD  Admit date: 09/27/2015 Discharge date: 10/05/2015  Admitted From: Home Disposition:  Home  Recommendations for Outpatient Follow-up:  1. Dr. Ria Bush, PCP in 1 week with repeat labs (CBC & BMP). 2. Dr. Grayland Jack, Cardiology on 10/19/15 at 1:45 PM.  Home Health: PT, OT & Aide Equipment/Devices: None    Discharge Condition: Improved and stable.  CODE STATUS: Full Diet recommendation: Heart Healthy & Diabetic diet.  Brief/Interim Summary: 82 year old female, from home, PMH of HTN, CAD, CABG, chronic diastolic CHF, HLD, DM 2, PAD presented to [facility] ED on 09/27/15 with reported fever, chills, generalized weakness, altered mental status, dry cough, dyspnea and multiple falls. In ED and noted to be in A. fib with RVR, CT head and neck unremarkable. Admitted for presumed UTI many A. fib with RVR. Cardiology following. Has reverted to sinus rhythm. Deemed not a candidate for long-term anticoagulation due to fall risk.  Discharge Diagnoses:  Active Problems:   Coronary artery disease   Anxiety   Hypertension   Diastolic dysfunction   Hyperlipidemia   Peptic ulcer disease   Dysphagia   Diabetic polyneuropathy (HCC)   Venous ulcer of leg (HCC)   Diabetes mellitus type 2 with peripheral artery disease (HCC)   Chronic venous insufficiency   Malnutrition of moderate degree   Acute respiratory failure (HCC)   Acute on chronic diastolic heart failure (HCC)   Persistent atrial fibrillation (HCC)   Paroxysmal atrial fibrillation (HCC)   Urinary tract infection, site not specified  New onset A. fib with RVR - Cardiology was consulted - Initially treated with Cardizem drip and IV heparin. Now off of both. - She had reverted to sinus rhythm but went back to A. fib with RVR in the 120s-130s on 6/28. Cardiology increased metoprolol dose. Patient reverted to sinus rhythm again and has  remained in sinus rhythm thus far. - Continue aspirin. As per prior records, not a good candidate for systemic anticoagulation due to fall risk. - Cardiology follow-up appreciated. No further recommendations at this time. Patient has outpatient appointment to see cardiology.  Acute on chronic diastolic CHF - Precipitated by A. fib with RVR. - Echo 09/28/15: Normal EF and moderate LVH. - Diuresed and now compensated. Continue home dose of Lasix.   Elevated troponin - Per cardiology. Felt to be related to demand ischemia. EKG without acute changes. Minimally elevated troponin with flat trend.  CAD status post CABG - No reported chest pain.  DM 2 - A1c: 7.1. Treated with SSI in the hospital. Resume PO meds as OP.  Hyperlipidemia - Continue Pravachol  PAD/bilateral varicosities and bilateral resting leg pain - S/p ablation of the left greater saphenous vein for painful varicosities two years ago.  - Vascular were consulted on admission by EDP, who recommended getting ABI and to call them if needed.  - ABI suggests bilateral moderate arterial insufficiency at rest. As per vascular surgery consultation notes, her thigh pain is not related to PAD. Patient's history is not very reliable. 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 No acute signs on physical exam. Ambulated with PT.  Escherichia coli UTI - Initially treated with IV Rocephin then changed to oral Keflex. Completed treatment with 6 days of total antibiotics.  Diarrhea - Noted 6/30. Probiotics added 6/30. Due to recent antibiotic exposure, leukocytosis, questionable abdominal discomfort, requested C. difficile PCR & negative. Discontinued Keflex 7/1.  -  Diarrhea resolved. Gives history of chronic intermittent diarrhea at home for which she is on medications.   Acute encephalopathy - Likely related to acute illness including UTI. Seems to have improved. As per discussion with patient's son on 6/29,  he has been visiting her couple times daily and indicates that her mental status is significantly improved compared to initial admission. She seems to have baseline mild cognitive impairment and possible dementia.stable.   Anemia - Stable  Acute kidney injury - Resolved.  Leukocytosis - May be related to UTI. Consider testing for C. difficile if diarrhea persists or worsens. No evidence of skin breakdown on detailed exam with patient's RN on 6/30. Minimal redness at the wrist IV site does not look significantly infected. Continue topical Bactroban and monitor-improved/resolved. Leukocytosis is improving. Outpatient follow-up and a few days.  Essential hypertension - Continue metoprolol which was newly started in the hospital. ACEI which was temporarily held due to AKI is to be resumed at discharge.   Consultants:   Cardiology  Vascular surgery  Procedures:   Foley catheter-discontinue 6/30  Antimicrobials:   IV Rocephin 6/25 > 6/26  Oral Keflex 6/27 > 7/1.  Discharge Instructions      Discharge Instructions    (HEART FAILURE PATIENTS) Call MD:  Anytime you have any of the following symptoms: 1) 3 pound weight gain in 24 hours or 5 pounds in 1 week 2) shortness of breath, with or without a dry hacking cough 3) swelling in the hands, feet or stomach 4) if you have to sleep on extra pillows at night in order to breathe.    Complete by:  As directed      Call MD for:  difficulty breathing, headache or visual disturbances    Complete by:  As directed      Call MD for:  extreme fatigue    Complete by:  As directed      Call MD for:  persistant dizziness or light-headedness    Complete by:  As directed      Call MD for:  persistant nausea and vomiting    Complete by:  As directed      Call MD for:  temperature >100.4    Complete by:  As directed      Diet - low sodium heart healthy    Complete by:  As directed      Diet Carb Modified    Complete by:  As directed       Increase activity slowly    Complete by:  As directed             Medication List    TAKE these medications        acetaminophen 500 MG tablet  Commonly known as:  TYLENOL  Take 500 mg by mouth every 6 (six) hours as needed for mild pain.     acidophilus Caps capsule  Take 1 capsule by mouth daily.     ALPRAZolam 1 MG tablet  Commonly known as:  XANAX  Take 0.5 mg by mouth 4 (four) times daily as needed for anxiety.     aspirin EC 81 MG tablet  Take 81 mg by mouth daily.     Dexlansoprazole 30 MG capsule  Commonly known as:  DEXILANT  Take 1 capsule (30 mg total) by mouth daily.     ferrous sulfate 325 (65 FE) MG tablet  Take 1 tablet (325 mg total) by mouth daily with breakfast.     furosemide 20  MG tablet  Commonly known as:  LASIX  Take 20 mg by mouth daily.     glucosamine-chondroitin 500-400 MG tablet  Take 1 tablet by mouth daily.     HYDROcodone-acetaminophen 5-325 MG tablet  Commonly known as:  NORCO/VICODIN  Take 0.5-1 tablets by mouth every 6 (six) hours as needed for moderate pain.     loperamide 2 MG capsule  Commonly known as:  IMODIUM  Take 2 mg by mouth 4 (four) times daily as needed for diarrhea or loose stools.     Metoprolol Tartrate 37.5 MG Tabs  Take 37.5 mg by mouth 2 (two) times daily.     nitroGLYCERIN 0.4 MG SL tablet  Commonly known as:  NITROSTAT  Place 0.4 mg under the tongue every 5 (five) minutes as needed for chest pain.     nystatin-triamcinolone ointment  Commonly known as:  MYCOLOG  Apply 1 application topically 2 (two) times daily as needed (for itching).     pioglitazone 30 MG tablet  Commonly known as:  ACTOS  Take 30 mg by mouth every evening.     pravastatin 20 MG tablet  Commonly known as:  PRAVACHOL  Take 20 mg by mouth at bedtime.     ramipril 5 MG capsule  Commonly known as:  ALTACE  Take 5 mg by mouth every evening.     traZODone 150 MG tablet  Commonly known as:  DESYREL  Take 150 mg by mouth at  bedtime.     triamcinolone cream 0.1 %  Commonly known as:  KENALOG  APPLY 1 APPLICATION TOPICALLY 2 (TWO) TIMES DAILY.     vitamin B-12 1000 MCG tablet  Commonly known as:  CYANOCOBALAMIN  Take 1,000 mcg by mouth daily.     vitamin E 400 UNIT capsule  Take 400 Units by mouth daily.       Follow-up Information    Follow up with [facility].   Why:  nse or phy ther will call to set up appt   Contact information:   [address] [place] [address] [phone]       Follow up with Ria Bush, MD. Schedule an appointment as soon as possible for a visit in 1 week.   Specialty:  Family Medicine   Why:  To be seen with repeat labs (CBC & BMP).   Contact information:   [place] [address] [phone]       Follow up with Mertie Moores, MD On 10/19/2015.   Specialty:  Cardiology   Why:  At 1:45 PM.   Contact information:   [address] [place] [address] [phone]      Allergies  Allergen Reactions  . Amoxicillin-Pot Clavulanate Other (See Comments)    Reaction:  Unknown   . Metformin And Related Diarrhea     Procedures/Studies: Dg Chest 1 View  09/27/2015  CLINICAL DATA:  Mental status changes, bilateral hip pain after several recent falls. EXAM: CHEST 1 VIEW COMPARISON:  Chest x-ray dated 01/14/2009. FINDINGS: Median sternotomy wires appear intact and stable in alignment, with surgical changes of associated CABG. Heart is enlarged, possibly accentuated to some degree by the lordotic/supine patient positioning. Atherosclerotic calcifications noted at the aortic arch. Mild interstitial prominence, also possibly accentuated by the supine patient positioning. No confluent opacity to suggest a developing pneumonia. No pleural effusion or pneumothorax seen. Left lung base obscured by the enlarged heart. IMPRESSION: 1. Cardiomegaly. Mild interstitial  prominence is probably accentuated by the supine  patient positioning, possibly indicating a mild CHF/volume overload. No overt alveolar pulmonary edema. No evidence of pneumonia. 2. Aortic atherosclerosis. Electronically Signed   By: Franki Cabot M.D.   On: 09/27/2015 15:21   Ct Head Wo Contrast  09/27/2015  CLINICAL DATA:  Altered mental status and fall. Lethargy and slurred speech. EXAM: CT HEAD WITHOUT CONTRAST CT CERVICAL SPINE WITHOUT CONTRAST TECHNIQUE: Multidetector CT imaging of the head and cervical spine was performed following the standard protocol without intravenous contrast. Multiplanar CT image reconstructions of the cervical spine were also generated. COMPARISON:  Brain MRI dated 05/13/2010. FINDINGS: CT HEAD FINDINGS Again noted is generalized age related brain atrophy with commensurate dilatation of the ventricles and sulci. Mild chronic small vessel ischemic changes again noted within the bilateral periventricular and subcortical white matter regions. 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 There is no mass, hemorrhage, edema or other evidence of acute parenchymal abnormality. No extra-axial hemorrhage. No osseous abnormality. Mild mucosal thickening noted in the left frontal sinus, of uncertain age but likely chronic. Visualized paranasal sinuses otherwise clear. CT CERVICAL SPINE FINDINGS Degenerative changes throughout the cervical spine, moderate in degree with associated disc space narrowings and osseous spurring throughout. Combinations of disc-osteophytic bulges, uncovertebral joint hypertrophy and facet joint hypertrophy are causing mild to moderate central canal stenoses throughout with possible associated nerve root impingement. Slight retrolisthesis of C4 is likely related to the underlying degenerative changes. There is no fracture line or displaced fracture fragment identified. Facet joints are appropriately aligned throughout. Atherosclerotic calcifications noted at  each carotid bulb region. Paravertebral soft tissues otherwise unremarkable. IMPRESSION: 1. No acute intracranial abnormality. No intracranial mass, hemorrhage or edema. Atrophy and chronic ischemic changes, as detailed above. 2. No fracture or acute subluxation within the cervical spine. Chronic/degenerative findings detailed above. Electronically Signed   By: Franki Cabot M.D.   On: 09/27/2015 16:08   Ct Cervical Spine Wo Contrast  09/27/2015  CLINICAL DATA:  Altered mental status and fall. Lethargy and slurred speech. EXAM: CT HEAD WITHOUT CONTRAST CT CERVICAL SPINE WITHOUT CONTRAST TECHNIQUE: Multidetector CT imaging of the head and cervical spine was performed following the standard protocol without intravenous contrast. Multiplanar CT image reconstructions of the cervical spine were also generated. COMPARISON:  Brain MRI dated 05/13/2010. FINDINGS: CT HEAD FINDINGS Again noted is generalized age related brain atrophy with commensurate dilatation of the ventricles and sulci. Mild chronic small vessel ischemic changes again noted within the bilateral periventricular and subcortical white matter regions. 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 There is no mass, hemorrhage, edema or other evidence of acute parenchymal abnormality. No extra-axial hemorrhage. No osseous abnormality. Mild mucosal thickening noted in the left frontal sinus, of uncertain age but likely chronic. Visualized paranasal sinuses otherwise clear. CT CERVICAL SPINE FINDINGS Degenerative changes throughout the cervical spine, moderate in degree with associated disc space narrowings and osseous spurring throughout. Combinations of disc-osteophytic bulges, uncovertebral joint hypertrophy and facet joint hypertrophy are causing mild to moderate central canal stenoses throughout with possible associated nerve root impingement. Slight retrolisthesis of C4 is likely related to the  underlying degenerative changes. There is no fracture line or displaced fracture fragment identified. Facet joints are appropriately aligned throughout. Atherosclerotic calcifications noted at each carotid bulb region. Paravertebral soft tissues otherwise unremarkable. IMPRESSION: 1. No acute intracranial abnormality. No intracranial mass, hemorrhage or edema. Atrophy and chronic  ischemic changes, as detailed above. 2. No fracture or acute subluxation within the cervical spine. Chronic/degenerative findings detailed above. Electronically Signed   By: Franki Cabot M.D.   On: 09/27/2015 16:08   Dg Humerus Right  09/24/2015  CLINICAL DATA:  Right upper arm pain.  Fall. EXAM: RIGHT HUMERUS - 2+ VIEW COMPARISON:  No recent prior. FINDINGS: Acromioclavicular and glenohumeral degenerative change. No acute bony or joint abnormality identified. No evidence of fracture or dislocation. High-riding right shoulder consistent with chronic rotator cuff tear . IMPRESSION: Degenerative changes right shoulder and elbow. High-riding right shoulder consistent with rotator cuff tear. No acute abnormality. Electronically Signed   By: Marcello Moores  Register   On: 09/24/2015 16:20   Dg Hips Bilat With Pelvis 3-4 Views  09/27/2015  CLINICAL DATA:  Mental status changes, recent falls with bilateral hip pain. EXAM: DG HIP (WITH OR WITHOUT PELVIS) 3-4V BILAT COMPARISON:  None. FINDINGS: Single view of the pelvis and two views of each hip are provided. Osseous alignment is normal. There is osteopenia which limits characterization of osseous detail, however, there is no fracture line or displaced fracture fragment identified. Mild degenerative change noted at each hip and within the scoliotic lower lumbar spine. Additional degenerative changes/scleroses seen at the sacroiliac joint spaces. Calcified atherosclerotic changes noted within soft tissues about the pelvis and proximal femurs. Soft tissues about the pelvis and hips are otherwise  unremarkable. IMPRESSION: No acute findings. No osseous fracture or dislocation. Osteopenia. Mild degenerative change. Atherosclerotic calcifications. Electronically Signed   By: Franki Cabot M.D.   On: 09/27/2015 15:18      Subjective: Patient denies complaints. As per RN, had normal consistency BM this morning without any diarrhea overnight.  Discharge Exam:  Filed Vitals:   10/04/15 1107 10/04/15 1459 10/04/15 2006 10/05/15 0622  BP: 121/52 135/56 128/55 152/56  Pulse: 80 70 71 62  Temp:  98.8 F (37.1 C) 98.4 F (36.9 C) 98.1 F (36.7 C)  TempSrc:  Oral Oral Oral  Resp:  20 18 16   Height:      Weight:    67.45 kg (148 lb 11.2 oz)  SpO2:  96% 96% 94%    General exam: Pleasant elderly female sitting up comfortably in chair. Respiratory system: Clear to auscultation. Respiratory effort normal. Cardiovascular system: S1 & S2 heard, RRR. No JVD, murmurs, rubs, gallops or clicks. No pedal edema. Telemetry: Sinus rhythm.  Gastrointestinal system: Abdomen is nondistended, soft and nontender. No organomegaly or masses felt. Normal bowel sounds heard. Central nervous system: Alert and oriented 2. No focal neurological deficits. Extremities: Symmetric 5 x 5 power. Diminished bilateral dorsalis pedis pulsations.  Skin: Examined in detail with patient's female and nursing aide at bedside, a couple of days ago. Apart from couple of old small superficial bruises over left mid back and one over right medial mid thigh, no other acute findings.  Psychiatry: Judgement and insight appear impaired. Mood & affect appropriate.    The results of significant diagnostics from this hospitalization (including imaging, microbiology, ancillary and laboratory) are listed below for reference.     Microbiology: Recent Results (from the past 240 hour(s))  Urine culture     Status: Abnormal   Collection Time: 09/27/15  3:20 PM  Result Value Ref Range Status   Specimen Description URINE, CLEAN CATCH   Final   Special Requests NONE  Final   Culture >=100,000 COLONIES/mL ESCHERICHIA COLI (A)  Final   Report Status 09/29/2015 FINAL  Final   Organism ID,  Bacteria ESCHERICHIA COLI (A)  Final      Susceptibility   Escherichia coli - MIC*    AMPICILLIN >=32 RESISTANT Resistant     CEFAZOLIN <=4 SENSITIVE Sensitive     CEFTRIAXONE <=1 SENSITIVE Sensitive     CIPROFLOXACIN <=0.25 SENSITIVE Sensitive     GENTAMICIN <=1 SENSITIVE Sensitive     IMIPENEM <=0.25 SENSITIVE Sensitive     NITROFURANTOIN <=16 SENSITIVE Sensitive     TRIMETH/SULFA >=320 RESISTANT Resistant     AMPICILLIN/SULBACTAM 16 INTERMEDIATE Intermediate     PIP/TAZO <=4 SENSITIVE Sensitive     * >=100,000 COLONIES/mL ESCHERICHIA COLI  MRSA PCR Screening     Status: None   Collection Time: 09/27/15 10:29 PM  Result Value Ref Range Status   MRSA by PCR NEGATIVE NEGATIVE Final    Comment:        The GeneXpert MRSA Assay (FDA approved for NASAL specimens only), is one component of a comprehensive MRSA colonization surveillance program. It is not intended to diagnose MRSA infection nor to guide or monitor treatment for MRSA infections.   C difficile quick scan w PCR reflex     Status: None   Collection Time: 10/03/15  8:04 AM  Result Value Ref Range Status   C Diff antigen NEGATIVE NEGATIVE Final   C Diff toxin NEGATIVE NEGATIVE Final   C Diff interpretation Negative for toxigenic C. difficile  Final     Labs: BNP (last 3 results)  Recent Labs  09/27/15 1652  BNP 0000000*   Basic Metabolic Panel:  Recent Labs Lab 09/30/15 0330 10/01/15 0214 10/02/15 0340 10/03/15 0630 10/05/15 0224  NA 136 137 138 139 138  K 3.6 3.7 4.0 4.0 4.0  CL 101 102 103 103 104  CO2 26 26 28 29 27   GLUCOSE 160* 132* 142* 150* 131*  BUN 32* 27* 25* 16 14  CREATININE 0.90 0.94 1.00 0.73 0.81  CALCIUM 8.2* 8.5* 8.6* 8.9 8.9   Liver Function Tests: No results for input(s): AST, ALT, ALKPHOS, BILITOT, PROT, ALBUMIN in the last 168  hours. No results for input(s): LIPASE, AMYLASE in the last 168 hours. No results for input(s): AMMONIA in the last 168 hours. CBC:  Recent Labs Lab 09/29/15 0254 10/01/15 0214 10/02/15 0340 10/03/15 0630 10/05/15 0224  WBC 16.0* 18.8* 19.4* 16.2* 14.7*  HGB 11.5* 11.2* 10.8* 10.7* 10.9*  HCT 36.1 34.4* 34.3* 33.5* 33.5*  MCV 90.3 92.2 94.8 93.8 93.6  PLT 193 292 327 385 399   Cardiac Enzymes:  Recent Labs Lab 10/01/15 0942 10/01/15 1256 10/01/15 1856  TROPONINI 0.05* 0.04* 0.03*   BNP: Invalid input(s): POCBNP CBG:  Recent Labs Lab 10/04/15 1057 10/04/15 1621 10/04/15 2120 10/05/15 0620 10/05/15 1136  GLUCAP 193* 139* 99 152* 171*   D-Dimer No results for input(s): DDIMER in the last 72 hours. Hgb A1c No results for input(s): HGBA1C in the last 72 hours. Lipid Profile No results for input(s): CHOL, HDL, LDLCALC, TRIG, CHOLHDL, LDLDIRECT in the last 72 hours. Thyroid function studies No results for input(s): TSH, T4TOTAL, T3FREE, THYROIDAB in the last 72 hours.  Invalid input(s): [name]3 Anemia work up No results for input(s): VITAMINB12, FOLATE, FERRITIN, TIBC, IRON, RETICCTPCT in the last 72 hours. Urinalysis    Component Value Date/Time   COLORURINE YELLOW 09/27/2015 1520   APPEARANCEUR TURBID* 09/27/2015 1520   LABSPEC 1.017 09/27/2015 1520   PHURINE 5.5 09/27/2015 1520   GLUCOSEU NEGATIVE 09/27/2015 1520   GLUCOSEU NEGATIVE 10/31/2014 1534   HGBUR  TRACE* 09/27/2015 [place] 09/27/2015 1520   BILIRUBINUR Negative 05/07/2012 1411   KETONESUR NEGATIVE 09/27/2015 1520   PROTEINUR 30* 09/27/2015 1520   PROTEINUR Negative 05/07/2012 1411   UROBILINOGEN 0.2 10/31/2014 1534   UROBILINOGEN 0.2 05/07/2012 1411   NITRITE NEGATIVE 09/27/2015 1520   NITRITE Negative 05/07/2012 1411   LEUKOCYTESUR LARGE* 09/27/2015 1520   Sepsis Labs Invalid input(s): PROCALCITONIN,  WBC,  LACTICIDVEN    Time coordinating discharge: Over 30  minutes  SIGNED:  Vernell Leep, MD, FACP, FHM. [facility] Pager [phone]  If 7PM-7AM, please contact night-coverage [email or web address] Password [email or web address] 10/05/2015, 1:21 PM

## 2015-10-05 NOTE — Care Management Important Message (Signed)
Important Message  Patient Details  Name: [name] MRN: [id number] Date of Birth: 1928/03/31   Medicare Important Message Given:  Yes    [name] [place] 10/05/2015, 10:44 AM

## 2015-10-05 NOTE — Discharge Instructions (Addendum)
Atrial Flutter Atrial flutter is a heart rhythm that can cause the heart to beat very fast (tachycardia). It originates in the upper chambers of the heart (atria). In atrial flutter, the top chambers of the heart (atria) often beat much faster than the bottom chambers of the heart (ventricles). Atrial flutter has a regular "saw toothed" appearance in an EKG readout. An EKG is a test that records the electrical activity of the heart. Atrial flutter can cause the heart to beat up to 150 beats per minute (BPM). Atrial flutter can either be short lived (paroxysmal) or permanent.  CAUSES  Causes of atrial flutter can be many. Some of these include:  Heart related issues:  Heart attack (myocardial infarction).  Heart failure.  Heart valve problems.  Poorly controlled high blood pressure (hypertension).  After open heart surgery.  Lung related issues:  A blood clot in the lungs (pulmonary embolism).  Chronic obstructive pulmonary disease (COPD). Medications used to treat COPD can attribute to atrial flutter.  Other related causes:  Hyperthyroidism.  Caffeine.  Some decongestant cold medications.  Low electrolyte levels such as potassium or magnesium.  Cocaine. SYMPTOMS  An awareness of your heart beating rapidly (palpitations).  Shortness of breath.  Chest pain.  Low blood pressure (hypotension).  Dizziness or fainting. DIAGNOSIS  Different tests can be performed to diagnose atrial flutter.   An EKG.  Holter monitor. This is a 24-hour recording of your heart rhythm. You will also be given a diary. Write down all symptoms that you have and what you were doing at the time you experienced symptoms.  Cardiac event monitor. This small device can be worn for up to 30 days. When you have heart symptoms, you will push a button on the device. This will then record your heart rhythm.  Echocardiogram. This is an imaging test to look at your heart. Your caregiver will look at your  heart valves and the ventricles.  Stress test. This test can help determine if the atrial flutter is related to exercise or if coronary artery disease is present.  Laboratory studies will look at certain blood levels like:  Complete blood count (CBC).  Potassium.  Magnesium.  Thyroid function. TREATMENT  Treatment of atrial flutter varies. A combination of therapies may be used or sometimes atrial flutter may need only 1 type of treatment.  Lab work: If your blood work, such as your electrolytes (potassium, magnesium) or your thyroid function tests, are abnormal, your caregiver will treat them accordingly.  Medication:  There are several different types of medications that can convert your heart to a normal rhythm and prevent atrial flutter from reoccurring.  Nonsurgical procedures: Nonsurgical techniques may be used to control atrial flutter. Some examples include:  Cardioversion. This technique uses either drugs or an electrical shock to restore a normal heart rhythm:  Cardioversion drugs may be given through an intravenous (IV) line to help "reset" the heart rhythm.  In electrical cardioversion, your caregiver shocks your heart with electrical energy. This helps to reset the heartbeat to a normal rhythm.  Ablation. If atrial flutter is a persistent problem, an ablation may be needed. This procedure is done under mild sedation. High frequency radio-wave energy is used to destroy the area of heart tissue responsible for atrial flutter. SEEK IMMEDIATE MEDICAL CARE IF:  You have:  Dizziness.  Near fainting or fainting.  Shortness of breath.  Chest pain or pressure.  Sudden nausea or vomiting.  Profuse sweating. If you have the above symptoms,  call your local emergency service immediately! Do not drive yourself to the hospital. MAKE SURE YOU:   Understand these instructions.  Will watch your condition.  Will get help right away if you are not doing well or get worse.    This information is not intended to replace advice given to you by your health care provider. Make sure you discuss any questions you have with your health care provider.   Document Released: 08/07/2008 Document Revised: 04/11/2014 Document Reviewed: 10/03/2014 Elsevier Interactive Patient Education 2016 Elsevier [employer].  Urinary Tract Infection Urinary tract infections (UTIs) can develop anywhere along your urinary tract. Your urinary tract is your body's drainage system for removing wastes and extra water. Your urinary tract includes two kidneys, two ureters, a bladder, and a urethra. Your kidneys are a pair of bean-shaped organs. Each kidney is about the size of your fist. They are located below your ribs, one on each side of your spine. CAUSES Infections are caused by microbes, which are microscopic organisms, including fungi, viruses, and bacteria. These organisms are so small that they can only be seen through a microscope. Bacteria are the microbes that most commonly cause UTIs. SYMPTOMS  Symptoms of UTIs may vary by age and gender of the patient and by the location of the infection. Symptoms in young women typically include a frequent and intense urge to urinate and a painful, burning feeling in the bladder or urethra during urination. Older women and men are more likely to be tired, shaky, and weak and have muscle aches and abdominal pain. A fever may mean the infection is in your kidneys. Other symptoms of a kidney infection include pain in your back or sides below the ribs, nausea, and vomiting. DIAGNOSIS To diagnose a UTI, your caregiver will ask you about your symptoms. Your caregiver will also ask you to provide a urine sample. The urine sample will be tested for bacteria and white blood cells. White blood cells are made by your body to help fight infection. TREATMENT  Typically, UTIs can be treated with medication. Because most UTIs are caused by a bacterial infection, they usually can  be treated with the use of antibiotics. The choice of antibiotic and length of treatment depend on your symptoms and the type of bacteria causing your infection. HOME CARE INSTRUCTIONS  If you were prescribed antibiotics, take them exactly as your caregiver instructs you. Finish the medication even if you feel better after you have only taken some of the medication.  Drink enough water and fluids to keep your urine clear or pale yellow.  Avoid caffeine, tea, and carbonated beverages. They tend to irritate your bladder.  Empty your bladder often. Avoid holding urine for long periods of time.  Empty your bladder before and after sexual intercourse.  After a bowel movement, women should cleanse from front to back. Use each tissue only once. SEEK MEDICAL CARE IF:   You have back pain.  You develop a fever.  Your symptoms do not begin to resolve within 3 days. SEEK IMMEDIATE MEDICAL CARE IF:   You have severe back pain or lower abdominal pain.  You develop chills.  You have nausea or vomiting.  You have continued burning or discomfort with urination. MAKE SURE YOU:   Understand these instructions.  Will watch your condition.  Will get help right away if you are not doing well or get worse.   This information is not intended to replace advice given to you by your health care provider.  Make sure you discuss any questions you have with your health care provider.   Document Released: 12/29/2004 Document Revised: 12/10/2014 Document Reviewed: 04/29/2011 Elsevier Interactive Patient Education [employer].

## 2015-10-05 NOTE — Progress Notes (Signed)
Occupational Therapy Treatment/Discharge Patient Details Name: [name] MRN: [id number] DOB: 1927-07-19 Today's Date: 10/05/2015    History of present illness 82 y.o. female admitted for AMS, fever, chills, general malaise, and UTI. Pt developed afib with RVR. PMH significant for HTN, HLD, CAD, CABG (1996), afib, dysphagia, diabetic polyneuropathy, DM type II, PUD, DVT of RLE.   OT comments  Pt progressing well, but still requires mod assist for sit-stand transfers. Pt able to complete bathing and dressing tasks with min assist to reach feet which is her baseline. Educated pt on energy conservation and fall prevention strategies. All education has been completed and pt has no further questions. Pt with no further acute OT needs. OT signing off.   Follow Up Recommendations  Home health OT;Supervision/Assistance - 24 hour    Equipment Recommendations  None recommended by OT    Recommendations for Other Services      Precautions / Restrictions Precautions Precautions: Fall Precaution Comments: multiple falls at home Restrictions Weight Bearing Restrictions: No       Mobility Bed Mobility               General bed mobility comments: Pt up in chair on OT arrival  Transfers Overall transfer level: Needs assistance Equipment used: Rolling walker (2 wheeled) Transfers: Sit to/from Stand Sit to Stand: Mod assist         General transfer comment: Mod assist for sit-stand transfers from recliner, min guard assist from toilet while pt used both hands to pull up on grab bar. VCs for safe hand placement.    Balance Overall balance assessment: Needs assistance Sitting-balance support: No upper extremity supported;Feet supported Sitting balance-Leahy Scale: Good     Standing balance support: Bilateral upper extremity supported;During functional activity Standing balance-Leahy Scale: Fair Standing balance comment: able to maintain balance without UE support to complete  grooming tasks at sink                   ADL Overall ADL's : Needs assistance/impaired     Grooming: Wash/dry hands;Min guard;Standing   Upper Body Bathing: Supervision/ safety;Sitting   Lower Body Bathing: Minimal assistance;Sit to/from stand   Upper Body Dressing : Standing;Min guard   Lower Body Dressing: Minimal assistance;Sit to/from stand Lower Body Dressing Details (indicate cue type and reason): assist to don compression socks and regular socks Toilet Transfer: Minimal assistance;Regular Toilet;RW;Grab bars;Ambulation Toilet Transfer Details (indicate cue type and reason): assist for boost to stand Toileting- Clothing Manipulation and Hygiene: Supervision/safety;Sitting/lateral lean       Functional mobility during ADLs: Moderate assistance;Rolling walker General ADL Comments: Educated pt on energy conservation and fall prevention strategies.      Vision                     Perception     Praxis      Cognition   Behavior During Therapy: Anxious Overall Cognitive Status: Within Functional Limits for tasks assessed                       Extremity/Trunk Assessment               Exercises     Shoulder Instructions       General Comments      Pertinent Vitals/ Pain       Pain Assessment: Faces Faces Pain Scale: Hurts Loe more Pain Location: LLE Pain Descriptors / Indicators: Aching Pain Intervention(s): Limited activity within patient's tolerance;Monitored  during session;Repositioned  Home Living                                          Prior Functioning/Environment              Frequency       Progress Toward Goals  OT Goals(current goals can now be found in the care plan section)  Progress towards OT goals: Goals met/education completed, patient discharged from OT  Acute Rehab OT Goals Patient Stated Goal: to go home OT Goal Formulation: With patient Time For Goal Achievement:  10/13/15 Potential to Achieve Goals: Good ADL Goals Pt Will Perform Grooming: with modified independence;sitting Pt Will Perform Lower Body Bathing: with supervision;sit to/from stand Pt Will Perform Lower Body Dressing: with supervision;sit to/from stand Pt Will Transfer to Toilet: with supervision;ambulating;bedside commode Pt Will Perform Toileting - Clothing Manipulation and hygiene: with supervision;sitting/lateral leans;sit to/from stand Additional ADL Goal #1: Pt will utilize 2 energy conservation strategies during ADL tasks with min verbal cues.  Plan All goals met and education completed, patient discharged from OT services    Co-evaluation                 End of Session Equipment Utilized During Treatment: Gait belt;Rolling walker   Activity Tolerance Patient tolerated treatment well   Patient Left in chair;with call bell/phone within reach;with family/visitor present   Nurse Communication Mobility status        Time: 1453-1526 OT Time Calculation (min): 33 min  Charges: OT General Charges $OT Visit: 1 Procedure OT Treatments $Self Care/Home Management : 23-37 mins  Redmond Baseman, OTR/L Pager: [phone]  10/05/2015, 4:16 PM

## 2015-10-05 NOTE — Progress Notes (Signed)
Discussed with the patient and all questioned fully answered. She will call me if any problems arise.  Pt given discharge instructions. All questions answered. Son expressed no questions.  Telemetry removed,  CCMD notified. IVs removed.  Pt edu included medications, HF management, and when to call the MD.  Fritz Pickerel, RN

## 2015-10-07 ENCOUNTER — Telehealth: Payer: Self-pay

## 2015-10-07 DIAGNOSIS — Z87891 Personal history of nicotine dependence: Secondary | ICD-10-CM | POA: Diagnosis not present

## 2015-10-07 DIAGNOSIS — F329 Major depressive disorder, single episode, unspecified: Secondary | ICD-10-CM | POA: Diagnosis not present

## 2015-10-07 DIAGNOSIS — I110 Hypertensive heart disease with heart failure: Secondary | ICD-10-CM | POA: Diagnosis not present

## 2015-10-07 DIAGNOSIS — Z951 Presence of aortocoronary bypass graft: Secondary | ICD-10-CM | POA: Diagnosis not present

## 2015-10-07 DIAGNOSIS — D649 Anemia, unspecified: Secondary | ICD-10-CM | POA: Diagnosis not present

## 2015-10-07 DIAGNOSIS — I2510 Atherosclerotic heart disease of native coronary artery without angina pectoris: Secondary | ICD-10-CM | POA: Diagnosis not present

## 2015-10-07 DIAGNOSIS — E440 Moderate protein-calorie malnutrition: Secondary | ICD-10-CM | POA: Diagnosis not present

## 2015-10-07 DIAGNOSIS — Z7982 Long term (current) use of aspirin: Secondary | ICD-10-CM | POA: Diagnosis not present

## 2015-10-07 DIAGNOSIS — I480 Paroxysmal atrial fibrillation: Secondary | ICD-10-CM | POA: Diagnosis not present

## 2015-10-07 DIAGNOSIS — I5033 Acute on chronic diastolic (congestive) heart failure: Secondary | ICD-10-CM | POA: Diagnosis not present

## 2015-10-07 DIAGNOSIS — E1151 Type 2 diabetes mellitus with diabetic peripheral angiopathy without gangrene: Secondary | ICD-10-CM | POA: Diagnosis not present

## 2015-10-07 DIAGNOSIS — Z87440 Personal history of urinary (tract) infections: Secondary | ICD-10-CM | POA: Diagnosis not present

## 2015-10-07 DIAGNOSIS — E1142 Type 2 diabetes mellitus with diabetic polyneuropathy: Secondary | ICD-10-CM | POA: Diagnosis not present

## 2015-10-07 DIAGNOSIS — G9340 Encephalopathy, unspecified: Secondary | ICD-10-CM | POA: Diagnosis not present

## 2015-10-07 DIAGNOSIS — F419 Anxiety disorder, unspecified: Secondary | ICD-10-CM | POA: Diagnosis not present

## 2015-10-07 NOTE — Telephone Encounter (Signed)
Contacted pt to provide date and time of next appt.

## 2015-10-07 NOTE — Progress Notes (Signed)
Insurance will not cover 37.5 mg strength I called pharmacy--[facility] in [place] for metoprolol tartrate 25 mg, #90, take 1 1/2 tabs bid, NO RF  DTat

## 2015-10-07 NOTE — Telephone Encounter (Signed)
Transition Care Management Follow-up Telephone Call    Date discharged? 10/05/2015   How have you been since you were released from the hospital? Generalized weakness   Do you understand why you were in the hospital? Yes   Do you understand the discharge instructions? Yes   Where were you discharged to? Home   Items Reviewed:  Medications reviewed: Yes  Allergies reviewed: Yes  Dietary changes reviewed: Yes  Referrals reviewed: Yes   Functional Questionnaire:   Activities of Daily Living (ADLs):   Pt is dependent with ADLs and requires assistance from spouse and friends.   Confirmed with patient if condition begins to worsen call PCP or go to the ER.  Patient was given the office number and encouraged to call back with question or concerns.  : YES

## 2015-10-08 DIAGNOSIS — I480 Paroxysmal atrial fibrillation: Secondary | ICD-10-CM | POA: Diagnosis not present

## 2015-10-08 DIAGNOSIS — I2510 Atherosclerotic heart disease of native coronary artery without angina pectoris: Secondary | ICD-10-CM | POA: Diagnosis not present

## 2015-10-08 DIAGNOSIS — E1142 Type 2 diabetes mellitus with diabetic polyneuropathy: Secondary | ICD-10-CM | POA: Diagnosis not present

## 2015-10-08 DIAGNOSIS — I5033 Acute on chronic diastolic (congestive) heart failure: Secondary | ICD-10-CM | POA: Diagnosis not present

## 2015-10-08 DIAGNOSIS — I110 Hypertensive heart disease with heart failure: Secondary | ICD-10-CM | POA: Diagnosis not present

## 2015-10-08 DIAGNOSIS — G9340 Encephalopathy, unspecified: Secondary | ICD-10-CM | POA: Diagnosis not present

## 2015-10-12 ENCOUNTER — Telehealth: Payer: Self-pay

## 2015-10-12 DIAGNOSIS — I5033 Acute on chronic diastolic (congestive) heart failure: Secondary | ICD-10-CM | POA: Diagnosis not present

## 2015-10-12 DIAGNOSIS — I110 Hypertensive heart disease with heart failure: Secondary | ICD-10-CM | POA: Diagnosis not present

## 2015-10-12 DIAGNOSIS — G9340 Encephalopathy, unspecified: Secondary | ICD-10-CM | POA: Diagnosis not present

## 2015-10-12 DIAGNOSIS — I2510 Atherosclerotic heart disease of native coronary artery without angina pectoris: Secondary | ICD-10-CM | POA: Diagnosis not present

## 2015-10-12 DIAGNOSIS — I480 Paroxysmal atrial fibrillation: Secondary | ICD-10-CM | POA: Diagnosis not present

## 2015-10-12 DIAGNOSIS — E1142 Type 2 diabetes mellitus with diabetic polyneuropathy: Secondary | ICD-10-CM | POA: Diagnosis not present

## 2015-10-12 NOTE — Telephone Encounter (Signed)
Agree thank you 

## 2015-10-12 NOTE — Telephone Encounter (Signed)
[name] nurse with [facility] left v/m requesting verbal orders for home health OT and social worker.Please advise.

## 2015-10-13 ENCOUNTER — Encounter: Payer: Self-pay | Admitting: Family Medicine

## 2015-10-13 ENCOUNTER — Ambulatory Visit (INDEPENDENT_AMBULATORY_CARE_PROVIDER_SITE_OTHER): Payer: Medicare Other | Admitting: Family Medicine

## 2015-10-13 VITALS — BP 114/60 | HR 80 | Temp 98.4°F | Wt 145.0 lb

## 2015-10-13 DIAGNOSIS — N390 Urinary tract infection, site not specified: Secondary | ICD-10-CM

## 2015-10-13 DIAGNOSIS — I5033 Acute on chronic diastolic (congestive) heart failure: Secondary | ICD-10-CM

## 2015-10-13 DIAGNOSIS — E1149 Type 2 diabetes mellitus with other diabetic neurological complication: Secondary | ICD-10-CM | POA: Diagnosis not present

## 2015-10-13 DIAGNOSIS — I480 Paroxysmal atrial fibrillation: Secondary | ICD-10-CM

## 2015-10-13 DIAGNOSIS — R531 Weakness: Secondary | ICD-10-CM

## 2015-10-13 DIAGNOSIS — F411 Generalized anxiety disorder: Secondary | ICD-10-CM

## 2015-10-13 DIAGNOSIS — E1151 Type 2 diabetes mellitus with diabetic peripheral angiopathy without gangrene: Secondary | ICD-10-CM

## 2015-10-13 DIAGNOSIS — I872 Venous insufficiency (chronic) (peripheral): Secondary | ICD-10-CM

## 2015-10-13 LAB — POC URINALSYSI DIPSTICK (AUTOMATED)
BILIRUBIN UA: NEGATIVE
GLUCOSE UA: NEGATIVE
KETONES UA: NEGATIVE
Nitrite, UA: POSITIVE
Protein, UA: NEGATIVE
RBC UA: NEGATIVE
Urobilinogen, UA: 0.2
pH, UA: 6

## 2015-10-13 MED ORDER — METOPROLOL TARTRATE 25 MG PO TABS: 37.5000 mg | ORAL_TABLET | Freq: Two times a day (BID) | ORAL | Status: DC

## 2015-10-13 NOTE — Patient Instructions (Addendum)
Labs today. Urinalysis today.  Make sure to take metoprolol 25mg  1.5 tablets twice daily (new heart pill for irregular heart beat).  Talk with one of our referral coordinators to help you find the new location for tomorrow's appointment with [facility].  Try stretching exercises for neck.  Keep legs elevated.  Return to see me in 2-3 weeks for follow up.

## 2015-10-13 NOTE — Telephone Encounter (Signed)
Left detailed message on voicemail giving verbal order as instructed.

## 2015-10-13 NOTE — Progress Notes (Signed)
Pre visit review using our clinic review tool, if applicable. No additional management support is needed unless otherwise documented below in the visit note. 

## 2015-10-13 NOTE — Progress Notes (Signed)
BP 114/60 mmHg  Pulse 80  Temp(Src) 98.4 F (36.9 C) (Oral)  Wt 145 lb (65.772 kg)  SpO2 97%   CC: hosp f/u visit  Subjective:    [name], female    DOB: 1927-12-31, 82 y.o.   MRN: [id number]  HPI: [name] is a 82 y.o. female presenting on 10/13/2015 for Hospitalization Follow-up   Recent hospitalization, found to have afib with RVR in setting of UTI and exacerbation of HFpEF. Treated with IV rocephin and oral keflex for Ecoli UTI and IV diuresis. Not anticoagulated due to high risk for falls. Maintaining sinus rhythm with metoprolol (new medication). Has f/u scheduled with Dr Acie Fredrickson.   In the hospital, she was seen by Dr Kellie Simmering who did not recommend any further intervention for her chronic painful varicosities. She did have ablation of L greater saphenous vein. ABI showed bilateral moderate arterial insufficiency at rest. Having trouble placing heavy compression stockings on.   Increased urination noted. Some heartburn as well. No fevers/chills, no confusion. No significant diarrhea or constipation (diarrhea in hospital has improved). No dysuria.   Discharged with [facility].   Admit date: 09/27/2015 Discharge date: 10/05/2015 F/u phone call date: 10/07/2015  Admitted From: Home Disposition: Home  Recommendations for Outpatient Follow-up:  1. Dr. Ria Bush, PCP in 1 week with repeat labs (CBC & BMP). 2. Dr. Grayland Jack, Cardiology on 10/19/15 at 1:45 PM.  Discharge Diagnoses:  Active Problems:  Coronary artery disease  Anxiety  Hypertension  Diastolic dysfunction  Hyperlipidemia  Peptic ulcer disease  Dysphagia  Diabetic polyneuropathy (HCC)  Venous ulcer of leg (HCC)  Diabetes mellitus type 2 with peripheral artery disease (HCC)  Chronic venous insufficiency  Malnutrition of moderate degree  Acute respiratory failure (HCC)  Acute on chronic diastolic heart failure (HCC)  Persistent atrial fibrillation (HCC)  Paroxysmal  atrial fibrillation (HCC)  Urinary tract infection, site not specified  Home Health: PT, OT & Aide Equipment/Devices: None   Discharge Condition: Improved and stable.  CODE STATUS: Full Diet recommendation: Heart Healthy & Diabetic diet.  Relevant past medical, surgical, family and social history reviewed and updated as indicated. Interim medical history since our last visit reviewed. Allergies and medications reviewed and updated. Current Outpatient Prescriptions on File Prior to Visit  Medication Sig  . acetaminophen (TYLENOL) 500 MG tablet Take 500 mg by mouth every 6 (six) hours as needed for mild pain.  Marland Kitchen acidophilus (RISAQUAD) CAPS capsule Take 1 capsule by mouth daily.  Marland Kitchen ALPRAZolam (XANAX) 1 MG tablet Take 0.5 mg by mouth 4 (four) times daily as needed for anxiety.   Marland Kitchen aspirin EC 81 MG tablet Take 81 mg by mouth daily.  Marland Kitchen Dexlansoprazole (DEXILANT) 30 MG capsule Take 1 capsule (30 mg total) by mouth daily.  . ferrous sulfate 325 (65 FE) MG tablet Take 1 tablet (325 mg total) by mouth daily with breakfast.  . furosemide (LASIX) 20 MG tablet Take 20 mg by mouth daily.  Marland Kitchen glucosamine-chondroitin 500-400 MG tablet Take 1 tablet by mouth daily.   Marland Kitchen HYDROcodone-acetaminophen (NORCO/VICODIN) 5-325 MG tablet Take 0.5-1 tablets by mouth every 6 (six) hours as needed for moderate pain.  Marland Kitchen loperamide (IMODIUM) 2 MG capsule Take 2 mg by mouth 4 (four) times daily as needed for diarrhea or loose stools.  . nitroGLYCERIN (NITROSTAT) 0.4 MG SL tablet Place 0.4 mg under the tongue every 5 (five) minutes as needed for chest pain.   Marland Kitchen nystatin-triamcinolone ointment (MYCOLOG) Apply 1  application topically 2 (two) times daily as needed (for itching).  . pioglitazone (ACTOS) 30 MG tablet Take 30 mg by mouth every evening.   . pravastatin (PRAVACHOL) 20 MG tablet Take 20 mg by mouth at bedtime.  . ramipril (ALTACE) 5 MG capsule Take 5 mg by mouth every evening.   . traZODone (DESYREL) 150 MG tablet  Take 150 mg by mouth at bedtime.   . triamcinolone cream (KENALOG) 0.1 % APPLY 1 APPLICATION TOPICALLY 2 (TWO) TIMES DAILY.  . vitamin B-12 (CYANOCOBALAMIN) 1000 MCG tablet Take 1,000 mcg by mouth daily.  . vitamin E 400 UNIT capsule Take 400 Units by mouth daily.   No current facility-administered medications on file prior to visit.    Review of Systems Per HPI unless specifically indicated in ROS section     Objective:    BP 114/60 mmHg  Pulse 80  Temp(Src) 98.4 F (36.9 C) (Oral)  Wt 145 lb (65.772 kg)  SpO2 97%  Wt Readings from Last 3 Encounters:  10/13/15 145 lb (65.772 kg)  10/05/15 148 lb 11.2 oz (67.45 kg)  09/24/15 154 lb 12.8 oz (70.217 kg)    Physical Exam  Constitutional: She appears well-developed and well-nourished. No distress.  HENT:  Head: Normocephalic and atraumatic.  Mouth/Throat: Oropharynx is clear and moist. No oropharyngeal exudate.  Cardiovascular: Normal rate, regular rhythm, normal heart sounds and intact distal pulses.   No murmur heard. Pulmonary/Chest: Effort normal and breath sounds normal. No respiratory distress. She has no wheezes. She has no rales.  Skin: Skin is warm and dry. No rash noted.  Psychiatric: Her mood appears anxious.  Nursing note and vitals reviewed.  Results for orders placed or performed in visit on 10/13/15  Urine culture  Result Value Ref Range   Colony Count >=100,000 COLONIES/ML    Organism ID, Bacteria Multiple bacterial morphotypes present, none    Organism ID, Bacteria predominant. Suggest appropriate recollection if     Organism ID, Bacteria clinically indicated.   CBC with Differential/Platelet  Result Value Ref Range   WBC 10.2 4.0 - 10.5 K/uL   RBC 4.10 3.87 - 5.11 Mil/uL   Hemoglobin 12.4 12.0 - 15.0 g/dL   HCT 37.6 36.0 - 46.0 %   MCV 91.7 78.0 - 100.0 fl   MCHC 33.0 30.0 - 36.0 g/dL   RDW 14.6 11.5 - 15.5 %   Platelets 391.0 150.0 - 400.0 K/uL   Neutrophils Relative % 68.6 43.0 - 77.0 %    Lymphocytes Relative 20.6 12.0 - 46.0 %   Monocytes Relative 6.9 3.0 - 12.0 %   Eosinophils Relative 1.9 0.0 - 5.0 %   Basophils Relative 2.0 0.0 - 3.0 %   Neutro Abs 7.0 1.4 - 7.7 K/uL   Lymphs Abs 2.1 0.7 - 4.0 K/uL   Monocytes Absolute 0.7 0.1 - 1.0 K/uL   Eosinophils Absolute 0.2 0.0 - 0.7 K/uL   Basophils Absolute 0.2 (H) 0.0 - 0.1 K/uL  Basic metabolic panel  Result Value Ref Range   Sodium 139 135 - 145 mEq/L   Potassium 4.8 3.5 - 5.1 mEq/L   Chloride 101 96 - 112 mEq/L   CO2 29 19 - 32 mEq/L   Glucose, Bld 85 70 - 99 mg/dL   BUN 20 6 - 23 mg/dL   Creatinine, Ser 1.01 0.40 - 1.20 mg/dL   Calcium 9.7 8.4 - 10.5 mg/dL   GFR 55.19 (L) >60.00 mL/min  POCT Urinalysis Dipstick (Automated)  Result Value Ref Range  Color, UA Yellow    Clarity, UA Slightly Cloudy    Glucose, UA Negative    Bilirubin, UA Negative    Ketones, UA Negative    Spec Grav, UA >=1.030    Blood, UA Negative    pH, UA 6.0    Protein, UA Negative    Urobilinogen, UA 0.2    Nitrite, UA Positive    Leukocytes, UA small (1+) (A) Negative      Assessment & Plan:   Problem List Items Addressed This Visit    GAD (generalized anxiety disorder)    Very anxious at baseline. Continue xanax, consider SSRI.       Type 2 diabetes mellitus with neurological manifestations, controlled ([place])    Followed by endo.      Diabetes mellitus type 2 with peripheral artery disease (HCC)   Relevant Medications   metoprolol tartrate (LOPRESSOR) 25 MG tablet   Chronic venous insufficiency   Relevant Medications   metoprolol tartrate (LOPRESSOR) 25 MG tablet   Weakness    Anticipate slow recovery.      Relevant Orders   CBC with Differential/Platelet (Completed)   Basic metabolic panel (Completed)   Acute on chronic diastolic heart failure ([place])    euvolemic today.      Relevant Medications   metoprolol tartrate (LOPRESSOR) 25 MG tablet   Paroxysmal atrial fibrillation ([place]) - Primary    Seems regular  today. Continue aspirin and metoprolol. Not a candidate for anticoagulation at this time - high fall risk, h/o peptic ulcer disease, h/o variceal bleed as well.       Relevant Medications   metoprolol tartrate (LOPRESSOR) 25 MG tablet   UTI (urinary tract infection)    Endorses ongoing urinary frequency despite completing 5d keflex course. No dysuria. Will rpt UA - abnormal so will send culture. Will update patient with results.      Relevant Orders   POCT Urinalysis Dipstick (Automated) (Completed)   Urine culture (Completed)       Follow up plan: Return in about 3 weeks (around 11/03/2015) for follow up visit.  Ria Bush, MD

## 2015-10-14 DIAGNOSIS — F411 Generalized anxiety disorder: Secondary | ICD-10-CM | POA: Diagnosis not present

## 2015-10-14 LAB — BASIC METABOLIC PANEL
BUN: 20 mg/dL (ref 6–23)
CALCIUM: 9.7 mg/dL (ref 8.4–10.5)
CO2: 29 meq/L (ref 19–32)
CREATININE: 1.01 mg/dL (ref 0.40–1.20)
Chloride: 101 mEq/L (ref 96–112)
GFR: 55.19 mL/min — ABNORMAL LOW (ref >60.00)
Glucose, Bld: 85 mg/dL (ref 70–99)
Potassium: 4.8 mEq/L (ref 3.5–5.1)
SODIUM: 139 meq/L (ref 135–145)

## 2015-10-14 LAB — CBC WITH DIFFERENTIAL/PLATELET
BASOS ABS: 0.2 10*3/uL — AB (ref 0.0–0.1)
Basophils Relative: 2 % (ref 0.0–3.0)
EOS PCT: 1.9 % (ref 0.0–5.0)
Eosinophils Absolute: 0.2 10*3/uL (ref 0.0–0.7)
HCT: 37.6 % (ref 36.0–46.0)
Hemoglobin: 12.4 g/dL (ref 12.0–15.0)
LYMPHS ABS: 2.1 10*3/uL (ref 0.7–4.0)
Lymphocytes Relative: 20.6 % (ref 12.0–46.0)
MCHC: 33 g/dL (ref 30.0–36.0)
MCV: 91.7 fl (ref 78.0–100.0)
MONO ABS: 0.7 10*3/uL (ref 0.1–1.0)
Monocytes Relative: 6.9 % (ref 3.0–12.0)
NEUTROS ABS: 7 10*3/uL (ref 1.4–7.7)
NEUTROS PCT: 68.6 % (ref 43.0–77.0)
PLATELETS: 391 10*3/uL (ref 150.0–400.0)
RBC: 4.1 Mil/uL (ref 3.87–5.11)
RDW: 14.6 % (ref 11.5–15.5)
WBC: 10.2 10*3/uL (ref 4.0–10.5)

## 2015-10-15 ENCOUNTER — Encounter: Payer: Self-pay | Admitting: Family Medicine

## 2015-10-15 DIAGNOSIS — I2510 Atherosclerotic heart disease of native coronary artery without angina pectoris: Secondary | ICD-10-CM | POA: Diagnosis not present

## 2015-10-15 DIAGNOSIS — I5033 Acute on chronic diastolic (congestive) heart failure: Secondary | ICD-10-CM | POA: Diagnosis not present

## 2015-10-15 DIAGNOSIS — E1142 Type 2 diabetes mellitus with diabetic polyneuropathy: Secondary | ICD-10-CM | POA: Diagnosis not present

## 2015-10-15 DIAGNOSIS — I110 Hypertensive heart disease with heart failure: Secondary | ICD-10-CM | POA: Diagnosis not present

## 2015-10-15 DIAGNOSIS — G9340 Encephalopathy, unspecified: Secondary | ICD-10-CM | POA: Diagnosis not present

## 2015-10-15 DIAGNOSIS — I480 Paroxysmal atrial fibrillation: Secondary | ICD-10-CM | POA: Diagnosis not present

## 2015-10-15 LAB — URINE CULTURE: Colony Count: >=100000

## 2015-10-15 NOTE — Assessment & Plan Note (Signed)
euvolemic today.  

## 2015-10-15 NOTE — Assessment & Plan Note (Signed)
Endorses ongoing urinary frequency despite completing 5d keflex course. No dysuria. Will rpt UA - abnormal so will send culture. Will update patient with results.

## 2015-10-15 NOTE — Assessment & Plan Note (Signed)
Seems regular today. Continue aspirin and metoprolol. Not a candidate for anticoagulation at this time - high fall risk, h/o peptic ulcer disease, h/o variceal bleed as well.

## 2015-10-15 NOTE — Assessment & Plan Note (Signed)
Anticipate slow recovery.

## 2015-10-15 NOTE — Assessment & Plan Note (Signed)
Very anxious at baseline. Continue xanax, consider SSRI.

## 2015-10-15 NOTE — Assessment & Plan Note (Signed)
Followed by endo.  

## 2015-10-16 DIAGNOSIS — I110 Hypertensive heart disease with heart failure: Secondary | ICD-10-CM | POA: Diagnosis not present

## 2015-10-16 DIAGNOSIS — E1142 Type 2 diabetes mellitus with diabetic polyneuropathy: Secondary | ICD-10-CM | POA: Diagnosis not present

## 2015-10-16 DIAGNOSIS — G9340 Encephalopathy, unspecified: Secondary | ICD-10-CM | POA: Diagnosis not present

## 2015-10-16 DIAGNOSIS — I2510 Atherosclerotic heart disease of native coronary artery without angina pectoris: Secondary | ICD-10-CM | POA: Diagnosis not present

## 2015-10-16 DIAGNOSIS — I5033 Acute on chronic diastolic (congestive) heart failure: Secondary | ICD-10-CM | POA: Diagnosis not present

## 2015-10-16 DIAGNOSIS — I480 Paroxysmal atrial fibrillation: Secondary | ICD-10-CM | POA: Diagnosis not present

## 2015-10-19 ENCOUNTER — Ambulatory Visit (INDEPENDENT_AMBULATORY_CARE_PROVIDER_SITE_OTHER): Payer: Medicare Other | Admitting: Cardiovascular Disease

## 2015-10-19 ENCOUNTER — Other Ambulatory Visit: Payer: Medicare Other

## 2015-10-19 ENCOUNTER — Encounter: Payer: Self-pay | Admitting: Cardiovascular Disease

## 2015-10-19 VITALS — BP 124/60 | HR 60 | Ht 62.0 in | Wt 145.8 lb

## 2015-10-19 DIAGNOSIS — I5033 Acute on chronic diastolic (congestive) heart failure: Secondary | ICD-10-CM | POA: Diagnosis not present

## 2015-10-19 DIAGNOSIS — I480 Paroxysmal atrial fibrillation: Secondary | ICD-10-CM | POA: Diagnosis not present

## 2015-10-19 DIAGNOSIS — I2510 Atherosclerotic heart disease of native coronary artery without angina pectoris: Secondary | ICD-10-CM | POA: Diagnosis not present

## 2015-10-19 DIAGNOSIS — I2583 Coronary atherosclerosis due to lipid rich plaque: Principal | ICD-10-CM

## 2015-10-19 NOTE — Progress Notes (Signed)
[place] Date of Birth  October 30, 1927 [facility] [address]    [place], [place]  [address] [phone]  Fax  [phone]   Problem list 1. Coronary artery disease-status post CABG in 1996 2. Diabetes mellitus 3. Hypertension 4. Hyperlipidemia 5. Right  DVT.   History of Present Illness:  [name] is an 82 yo female with a Hx of CAD, CABG ( November 09, 1994), diabetes mellitus, anxiety, HTN, hyperlipidemia.   She's been under a lot of stress recently. Her son died last year. She's under a lot of stress with the extra expenses involved such as his funeral. She presented with some CP in December.  A stress myoview revealed lateral ischemia.  We offered cardiac cath but she did not want to have the cath. She presents today without any particular complaints. She has not had any episodes of chest pain or shortness of breath since I saw her a month ago. She does have occasional episodes of tachycardia. She also notes that she has some anxiety. She was last seen in January after having an abnormal stress test that revealed anterior apical ischemia. She refused cardiac catheterization at that time. She has not had any recent episodes of chest pain.  She does not want to have a cardiac catheterization since she's feeling so well.  She has had some chest burning that is nearly constant.  I suggest several times that she should consider getting the cath done and she changed to subject.  September 04, 2012:  [name] is doing well from a cardiac standpoint.  She is still working - despite having lots of back pain.  No CP .  She had some CP several years ago and had a mildly abnormal myoview.  She did not want to have a cardiac cath at that time and has not had any significant epidoses of CP since that time.  She continues to be under lots of stress  She complains of back pain, stomach pain, burning in her legs.    Nov. 24, 2014:  Pt is doing OK from a cardiac standpoint.   Lots of stress - she has been running the [employer] by herself for the past several months.  She is not getting enough rest.  Sleeping 4 1/2 hours at night - tries to get a nap for an hour.    September 09, 2013:  [name] is doing ok.  She is under lots of stress as usual.    Nov. 10, 2015:  [name] presents with various aches and pains.  She has a vericose vein in her leg that bled. She is on Xarelto for a right leg DVT on Sept. 30. .   CT angio a week later was negative for PE.   She has lots of anxiety.  Has lots of chest pain.  The pain is not pleuretic.  The pain is not associated with exercise.  She does not have any pain while she is working but more often occurs with stress.   She is living with 2 elderly men now and has lots of stressful issues relating to that.    This seems to be a major cause of her pain.  She does have 80 year old SVGs  September 23, 2014:  Doing well from a cardiac standpoint. Lots of anxiety .  Has a open right heel ulcer.   Jan. 9, 2017  [name] has been doing ok No CP.  No dyspnea Has had right  ankle pain and swelling  Occasionally has faster than normal heart rate,  No associated dyspnea , CP or dizziness.  Has had an upper respiratory tract infection  Recently   October 19, 2015:  Has had a UTI since I last saw her.  Was in the hospital  Had new A-fib with RVR with the hospitalization  Has converted back to NSR .    Current Outpatient Prescriptions on File Prior to Visit  Medication Sig Dispense Refill  . acetaminophen (TYLENOL) 500 MG tablet Take 500 mg by mouth every 6 (six) hours as needed for mild pain.    Marland Kitchen acidophilus (RISAQUAD) CAPS capsule Take 1 capsule by mouth daily.    Marland Kitchen ALPRAZolam (XANAX) 1 MG tablet Take 0.5 mg by mouth 4 (four) times daily as needed for anxiety.     Marland Kitchen aspirin EC 81 MG tablet Take 81 mg by mouth daily.    Marland Kitchen Dexlansoprazole (DEXILANT) 30 MG capsule Take 1 capsule (30 mg total) by mouth daily. 30 capsule 11  .  ferrous sulfate 325 (65 FE) MG tablet Take 1 tablet (325 mg total) by mouth daily with breakfast.    . furosemide (LASIX) 20 MG tablet Take 20 mg by mouth daily.    Marland Kitchen glucosamine-chondroitin 500-400 MG tablet Take 1 tablet by mouth daily.     Marland Kitchen HYDROcodone-acetaminophen (NORCO/VICODIN) 5-325 MG tablet Take 0.5-1 tablets by mouth every 6 (six) hours as needed for moderate pain. 60 tablet 0  . loperamide (IMODIUM) 2 MG capsule Take 2 mg by mouth 4 (four) times daily as needed for diarrhea or loose stools.    . metoprolol tartrate (LOPRESSOR) 25 MG tablet Take 1.5 tablets (37.5 mg total) by mouth 2 (two) times daily. 270 tablet 3  . nitroGLYCERIN (NITROSTAT) 0.4 MG SL tablet Place 0.4 mg under the tongue every 5 (five) minutes as needed for chest pain.     Marland Kitchen nystatin-triamcinolone ointment (MYCOLOG) Apply 1 application topically 2 (two) times daily as needed (for itching).    . pioglitazone (ACTOS) 30 MG tablet Take 30 mg by mouth every evening.     . pravastatin (PRAVACHOL) 20 MG tablet Take 20 mg by mouth at bedtime.    . ramipril (ALTACE) 5 MG capsule Take 5 mg by mouth every evening.     . traZODone (DESYREL) 150 MG tablet Take 150 mg by mouth at bedtime.     . triamcinolone cream (KENALOG) 0.1 % APPLY 1 APPLICATION TOPICALLY 2 (TWO) TIMES DAILY.  1  . vitamin B-12 (CYANOCOBALAMIN) 1000 MCG tablet Take 1,000 mcg by mouth daily.    . vitamin E 400 UNIT capsule Take 400 Units by mouth daily.     No current facility-administered medications on file prior to visit.    Allergies  Allergen Reactions  . Amoxicillin-Pot Clavulanate Other (See Comments)    Reaction:  Unknown   . Metformin And Related Diarrhea    Past Medical History  Diagnosis Date  . Hypertension   . Coronary artery disease     CABG 1998  . Anxiety   . Diastolic dysfunction   . Hyperlipidemia   . PUD (peptic ulcer disease)   . Microcytic anemia   . Depression   . Diabetes mellitus     type 2, followed by endo.  .  Diverticulosis   . Hemorrhoids   . History of heart attack   . Irritable bowel syndrome   . Chronic back pain 03/2012    lumbar DDD with herniation -  s/p L S1 transforaminal ESI (10/2012 Dr. Sharlet Salina)  . Varicose veins   . Cystocele   . Hx of echocardiogram     a. Echo 10/13: mild LVH, EF 55-60%, Gr 1 diast dysfn, PASP 32  . Acute deep vein thrombosis (DVT) of distal vein of right lower extremity ([place]) 12/2013    Acute thrombus of R proximal gastrocnemius vein. Symptomatic provoked distal DVT  . Cellulitis of leg, left 01/16/2014  . PAD (peripheral artery disease) ([place]) 2013    ABI: R 0.91, L 0.83  . Chronic venous insufficiency   . Ankle wound   . Viral gastroenteritis due to [place] virus 05/13/2015    hospitalization    Past Surgical History  Procedure Laterality Date  . Tympanoplasty  1960s    R side  . Coronary artery bypass graft  1998  . Cataract surgery    . Cholecystectomy    . Appendectomy      per pt report  . Colonoscopy  02/2010    extensive diverticulosis throughout, small internal hemorrhoids, rec rpt 5 yrs (Dr. Allyn Kenner)  . Hand surgery    . Lexiscan myoview  03/2011    Small basal inferolateral and anterolateral reversible perfusion defect suggests ischemia.  EF was normal.   old infarct, no new ischemia  . Toe surgery    . Abdominal hysterectomy  1975    TAH,BSO  . Vault prolapse a & p repair  2009    Dr [facility] hospital  . Dexa scan  09/2011    femur -0.8, forearm 0.6 => normal  . Esi  2014    L S1 transforaminal ESI x2 ([place]) without improvement  . Abi  2013    R 0.91, L 0.83  . Endovenous ablation saphenous vein w/ laser Left 03/2014    Kellie Simmering  . Cardiovascular stress test  2015    low risk myoview (Nary Sneed)    History  Smoking status  . Former Smoker  . Quit date: 03/05/1979  Smokeless tobacco  . Never Used    History  Alcohol Use No    Family History  Problem Relation Age of Onset  . Hypertension Mother   . Stroke Mother   .  Diabetes Father   . Diabetes Sister   . Diabetes Brother   . Colon cancer Neg Hx     Reviw of Systems:  Reviewed in the HPI.  All other systems are negative.  Physical Exam: BP 124/60 mmHg  Pulse 60  Ht 5\' 2"  (1.575 m)  Wt 145 lb 12.8 oz (66.134 kg)  BMI 26.66 kg/m2 The patient is alert and oriented x 3.  The mood and affect are normal.   Skin: warm and dry.  Color is normal.    HEENT:   Normocephalic/atraumatic. Neck is supple. Her mucous membranes are moist. The JVD. Her carotids are normal. Lungs: Lung exam is clear.    Heart: Regular rate S1-S2. She has no murmurs.   Abdomen: Abdomen soft. Extremities:  No significant edema   Neuro:  Exam is nonfocal.    ECG:   Assessment / Plan:   1. Coronary artery disease-status post CABG in 1996 - she's doing very well. She's not had any episodes of angina. Continue current medications.  2. Paroxysmal atrial fib :  Has converted to NSR  Echo shows normal LV systolic function  3. Shortness of breath:     Still very short of breath. Echo showed normal LV function    4. Hypertension -  blood pressure is  well-controlled. 5. Hyperlipidemia    6. Right  DVT.  -   7. Anxiety:  Has lots of anxiety issues / depression issues Very tearful today  She clearly is not feeling well.   I dont think that her symptoms are cardiac.    Mertie Moores, MD  10/19/2015 2:03 PM    [facility],  [place], [place]  [address] Pager [phone] Phone: [phone]; Fax: [phone]

## 2015-10-19 NOTE — Patient Instructions (Addendum)

## 2015-10-20 DIAGNOSIS — I2510 Atherosclerotic heart disease of native coronary artery without angina pectoris: Secondary | ICD-10-CM | POA: Diagnosis not present

## 2015-10-20 DIAGNOSIS — I480 Paroxysmal atrial fibrillation: Secondary | ICD-10-CM | POA: Diagnosis not present

## 2015-10-20 DIAGNOSIS — I5033 Acute on chronic diastolic (congestive) heart failure: Secondary | ICD-10-CM | POA: Diagnosis not present

## 2015-10-20 DIAGNOSIS — G9340 Encephalopathy, unspecified: Secondary | ICD-10-CM | POA: Diagnosis not present

## 2015-10-20 DIAGNOSIS — E1142 Type 2 diabetes mellitus with diabetic polyneuropathy: Secondary | ICD-10-CM | POA: Diagnosis not present

## 2015-10-20 DIAGNOSIS — I110 Hypertensive heart disease with heart failure: Secondary | ICD-10-CM | POA: Diagnosis not present

## 2015-10-21 ENCOUNTER — Other Ambulatory Visit: Payer: Self-pay

## 2015-10-21 DIAGNOSIS — I480 Paroxysmal atrial fibrillation: Secondary | ICD-10-CM | POA: Diagnosis not present

## 2015-10-21 DIAGNOSIS — I2510 Atherosclerotic heart disease of native coronary artery without angina pectoris: Secondary | ICD-10-CM | POA: Diagnosis not present

## 2015-10-21 DIAGNOSIS — G9340 Encephalopathy, unspecified: Secondary | ICD-10-CM | POA: Diagnosis not present

## 2015-10-21 DIAGNOSIS — I5033 Acute on chronic diastolic (congestive) heart failure: Secondary | ICD-10-CM | POA: Diagnosis not present

## 2015-10-21 DIAGNOSIS — E1142 Type 2 diabetes mellitus with diabetic polyneuropathy: Secondary | ICD-10-CM | POA: Diagnosis not present

## 2015-10-21 DIAGNOSIS — I110 Hypertensive heart disease with heart failure: Secondary | ICD-10-CM | POA: Diagnosis not present

## 2015-10-21 NOTE — Telephone Encounter (Signed)
Pt requesting rx hydrocodone apap. Call when ready for pick up. Last printed # 60 on 09/08/15 and last seen 10/13/15. Pt has enough med to last until Dr Darnell Level return on 10/23/15.

## 2015-10-22 DIAGNOSIS — I110 Hypertensive heart disease with heart failure: Secondary | ICD-10-CM | POA: Diagnosis not present

## 2015-10-22 DIAGNOSIS — I480 Paroxysmal atrial fibrillation: Secondary | ICD-10-CM | POA: Diagnosis not present

## 2015-10-22 DIAGNOSIS — I5033 Acute on chronic diastolic (congestive) heart failure: Secondary | ICD-10-CM | POA: Diagnosis not present

## 2015-10-22 DIAGNOSIS — I2510 Atherosclerotic heart disease of native coronary artery without angina pectoris: Secondary | ICD-10-CM | POA: Diagnosis not present

## 2015-10-22 DIAGNOSIS — G9340 Encephalopathy, unspecified: Secondary | ICD-10-CM | POA: Diagnosis not present

## 2015-10-22 DIAGNOSIS — E1142 Type 2 diabetes mellitus with diabetic polyneuropathy: Secondary | ICD-10-CM | POA: Diagnosis not present

## 2015-10-23 ENCOUNTER — Other Ambulatory Visit: Payer: Self-pay | Admitting: Gynecology

## 2015-10-23 MED ORDER — HYDROCODONE-ACETAMINOPHEN 5-325 MG PO TABS: 0.5000 | ORAL_TABLET | Freq: Four times a day (QID) | ORAL | Status: DC | PRN

## 2015-10-23 NOTE — Telephone Encounter (Signed)
Patient notified and Rx placed up front for pick up. 

## 2015-10-23 NOTE — Telephone Encounter (Signed)
Printed and in Kim's box 

## 2015-10-26 DIAGNOSIS — I110 Hypertensive heart disease with heart failure: Secondary | ICD-10-CM | POA: Diagnosis not present

## 2015-10-26 DIAGNOSIS — G9340 Encephalopathy, unspecified: Secondary | ICD-10-CM | POA: Diagnosis not present

## 2015-10-26 DIAGNOSIS — I5033 Acute on chronic diastolic (congestive) heart failure: Secondary | ICD-10-CM | POA: Diagnosis not present

## 2015-10-26 DIAGNOSIS — I480 Paroxysmal atrial fibrillation: Secondary | ICD-10-CM | POA: Diagnosis not present

## 2015-10-26 DIAGNOSIS — E1142 Type 2 diabetes mellitus with diabetic polyneuropathy: Secondary | ICD-10-CM | POA: Diagnosis not present

## 2015-10-26 DIAGNOSIS — I2510 Atherosclerotic heart disease of native coronary artery without angina pectoris: Secondary | ICD-10-CM | POA: Diagnosis not present

## 2015-10-27 DIAGNOSIS — I5033 Acute on chronic diastolic (congestive) heart failure: Secondary | ICD-10-CM | POA: Diagnosis not present

## 2015-10-27 DIAGNOSIS — G9340 Encephalopathy, unspecified: Secondary | ICD-10-CM | POA: Diagnosis not present

## 2015-10-27 DIAGNOSIS — I2510 Atherosclerotic heart disease of native coronary artery without angina pectoris: Secondary | ICD-10-CM | POA: Diagnosis not present

## 2015-10-27 DIAGNOSIS — E1142 Type 2 diabetes mellitus with diabetic polyneuropathy: Secondary | ICD-10-CM | POA: Diagnosis not present

## 2015-10-27 DIAGNOSIS — I110 Hypertensive heart disease with heart failure: Secondary | ICD-10-CM | POA: Diagnosis not present

## 2015-10-27 DIAGNOSIS — I480 Paroxysmal atrial fibrillation: Secondary | ICD-10-CM | POA: Diagnosis not present

## 2015-10-28 DIAGNOSIS — I480 Paroxysmal atrial fibrillation: Secondary | ICD-10-CM | POA: Diagnosis not present

## 2015-10-28 DIAGNOSIS — I110 Hypertensive heart disease with heart failure: Secondary | ICD-10-CM | POA: Diagnosis not present

## 2015-10-28 DIAGNOSIS — I5033 Acute on chronic diastolic (congestive) heart failure: Secondary | ICD-10-CM | POA: Diagnosis not present

## 2015-10-28 DIAGNOSIS — E1142 Type 2 diabetes mellitus with diabetic polyneuropathy: Secondary | ICD-10-CM | POA: Diagnosis not present

## 2015-10-28 DIAGNOSIS — I2510 Atherosclerotic heart disease of native coronary artery without angina pectoris: Secondary | ICD-10-CM | POA: Diagnosis not present

## 2015-10-28 DIAGNOSIS — G9340 Encephalopathy, unspecified: Secondary | ICD-10-CM | POA: Diagnosis not present

## 2015-10-30 DIAGNOSIS — I5033 Acute on chronic diastolic (congestive) heart failure: Secondary | ICD-10-CM | POA: Diagnosis not present

## 2015-10-30 DIAGNOSIS — G9340 Encephalopathy, unspecified: Secondary | ICD-10-CM | POA: Diagnosis not present

## 2015-10-30 DIAGNOSIS — E1142 Type 2 diabetes mellitus with diabetic polyneuropathy: Secondary | ICD-10-CM | POA: Diagnosis not present

## 2015-10-30 DIAGNOSIS — I2510 Atherosclerotic heart disease of native coronary artery without angina pectoris: Secondary | ICD-10-CM | POA: Diagnosis not present

## 2015-10-30 DIAGNOSIS — I480 Paroxysmal atrial fibrillation: Secondary | ICD-10-CM | POA: Diagnosis not present

## 2015-10-30 DIAGNOSIS — I110 Hypertensive heart disease with heart failure: Secondary | ICD-10-CM | POA: Diagnosis not present

## 2015-11-02 ENCOUNTER — Telehealth: Payer: Self-pay

## 2015-11-02 DIAGNOSIS — I5033 Acute on chronic diastolic (congestive) heart failure: Secondary | ICD-10-CM | POA: Diagnosis not present

## 2015-11-02 DIAGNOSIS — E1142 Type 2 diabetes mellitus with diabetic polyneuropathy: Secondary | ICD-10-CM | POA: Diagnosis not present

## 2015-11-02 DIAGNOSIS — I480 Paroxysmal atrial fibrillation: Secondary | ICD-10-CM | POA: Diagnosis not present

## 2015-11-02 DIAGNOSIS — I2510 Atherosclerotic heart disease of native coronary artery without angina pectoris: Secondary | ICD-10-CM | POA: Diagnosis not present

## 2015-11-02 DIAGNOSIS — G9340 Encephalopathy, unspecified: Secondary | ICD-10-CM | POA: Diagnosis not present

## 2015-11-02 DIAGNOSIS — I110 Hypertensive heart disease with heart failure: Secondary | ICD-10-CM | POA: Diagnosis not present

## 2015-11-02 NOTE — Telephone Encounter (Signed)
Esther OT with [facility] left v/m requesting verbal orders for 2 more  home health OT visits to complete plan of care.

## 2015-11-02 NOTE — Telephone Encounter (Signed)
Approve.  Thanks.

## 2015-11-03 NOTE — Telephone Encounter (Signed)
Message left advising [name]

## 2015-11-04 DIAGNOSIS — I2510 Atherosclerotic heart disease of native coronary artery without angina pectoris: Secondary | ICD-10-CM | POA: Diagnosis not present

## 2015-11-04 DIAGNOSIS — G9340 Encephalopathy, unspecified: Secondary | ICD-10-CM | POA: Diagnosis not present

## 2015-11-04 DIAGNOSIS — I480 Paroxysmal atrial fibrillation: Secondary | ICD-10-CM | POA: Diagnosis not present

## 2015-11-04 DIAGNOSIS — I5033 Acute on chronic diastolic (congestive) heart failure: Secondary | ICD-10-CM | POA: Diagnosis not present

## 2015-11-04 DIAGNOSIS — I110 Hypertensive heart disease with heart failure: Secondary | ICD-10-CM | POA: Diagnosis not present

## 2015-11-04 DIAGNOSIS — E1142 Type 2 diabetes mellitus with diabetic polyneuropathy: Secondary | ICD-10-CM | POA: Diagnosis not present

## 2015-11-05 DIAGNOSIS — I5033 Acute on chronic diastolic (congestive) heart failure: Secondary | ICD-10-CM | POA: Diagnosis not present

## 2015-11-05 DIAGNOSIS — I2510 Atherosclerotic heart disease of native coronary artery without angina pectoris: Secondary | ICD-10-CM | POA: Diagnosis not present

## 2015-11-05 DIAGNOSIS — E1142 Type 2 diabetes mellitus with diabetic polyneuropathy: Secondary | ICD-10-CM | POA: Diagnosis not present

## 2015-11-05 DIAGNOSIS — G9340 Encephalopathy, unspecified: Secondary | ICD-10-CM | POA: Diagnosis not present

## 2015-11-05 DIAGNOSIS — I110 Hypertensive heart disease with heart failure: Secondary | ICD-10-CM | POA: Diagnosis not present

## 2015-11-05 DIAGNOSIS — I480 Paroxysmal atrial fibrillation: Secondary | ICD-10-CM | POA: Diagnosis not present

## 2015-11-06 ENCOUNTER — Ambulatory Visit (INDEPENDENT_AMBULATORY_CARE_PROVIDER_SITE_OTHER): Payer: Medicare Other | Admitting: Family Medicine

## 2015-11-06 ENCOUNTER — Encounter: Payer: Self-pay | Admitting: Family Medicine

## 2015-11-06 VITALS — BP 128/68 | HR 60 | Temp 98.2°F | Wt 144.5 lb

## 2015-11-06 DIAGNOSIS — F411 Generalized anxiety disorder: Secondary | ICD-10-CM

## 2015-11-06 DIAGNOSIS — R3915 Urgency of urination: Secondary | ICD-10-CM

## 2015-11-06 DIAGNOSIS — I2510 Atherosclerotic heart disease of native coronary artery without angina pectoris: Secondary | ICD-10-CM

## 2015-11-06 DIAGNOSIS — I10 Essential (primary) hypertension: Secondary | ICD-10-CM | POA: Diagnosis not present

## 2015-11-06 DIAGNOSIS — M79605 Pain in left leg: Secondary | ICD-10-CM

## 2015-11-06 DIAGNOSIS — I5030 Unspecified diastolic (congestive) heart failure: Secondary | ICD-10-CM

## 2015-11-06 DIAGNOSIS — M79604 Pain in right leg: Secondary | ICD-10-CM

## 2015-11-06 DIAGNOSIS — I480 Paroxysmal atrial fibrillation: Secondary | ICD-10-CM

## 2015-11-06 DIAGNOSIS — R600 Localized edema: Secondary | ICD-10-CM

## 2015-11-06 LAB — POC URINALSYSI DIPSTICK (AUTOMATED)
Bilirubin, UA: NEGATIVE
GLUCOSE UA: NEGATIVE
Ketones, UA: NEGATIVE
Leukocytes, UA: NEGATIVE
Nitrite, UA: NEGATIVE
PROTEIN UA: NEGATIVE
RBC UA: NEGATIVE
SPEC GRAV UA: 1.025
UROBILINOGEN UA: 0.2
pH, UA: 6

## 2015-11-06 NOTE — Assessment & Plan Note (Signed)
No significant edema seen today - overall doing well.

## 2015-11-06 NOTE — Assessment & Plan Note (Addendum)
Bilateral pain from CVI and known PAD/PVD.  Continue hydrocodone prn.

## 2015-11-06 NOTE — Assessment & Plan Note (Signed)
Describes ongoing urinary frequency and urgency with new BRB with wiping noted today. Check UA.

## 2015-11-06 NOTE — Assessment & Plan Note (Signed)
euvolemic today.  

## 2015-11-06 NOTE — Patient Instructions (Addendum)
Urinalysis today.  You are doing well today. Continue current medicines.  Return in 4-6 weeks for follow up visit.

## 2015-11-06 NOTE — Assessment & Plan Note (Signed)
Sounds regular today. Continue metoprolol and aspirin. High bleed risk due to h/o falls, PUD, variceal leg bleeds.

## 2015-11-06 NOTE — Assessment & Plan Note (Signed)
Declines daily anxiety medication besides xanax.

## 2015-11-06 NOTE — Addendum Note (Signed)
Addended by: Modena Nunnery on: 11/06/2015 02:40 PM   Modules accepted: Orders

## 2015-11-06 NOTE — Progress Notes (Signed)
BP 128/68   Pulse 60   Temp 98.2 F (36.8 C) (Oral)   Wt 144 lb 8 oz (65.5 kg)   SpO2 96%   BMI 26.43 kg/m    CC: 3 wk f/u visit Subjective:    [name], female    DOB: 1928-01-28, 82 y.o.   MRN: [id number]  HPI: [name] is a 82 y.o. female presenting on 11/06/2015 for Follow-up   Here for short term f/u after recent hospitalization with UTI, HFpEF exacerbation and afib with RVR. Known PAD, PVD. HH PT/OT remain involved. Legs continue to hurt despite wearing compression stockings.   She did notice some blood with wiping anteriorly today. Noticing increasing frequency and urgency. Denies dysuria, flank pain or nausea/vomiting.   Anxiety - taking xanax regularly. Not interested in another medication.  Preoccupied because she came to work yesterday when [employer] stopped working. [name] has been taking care of business. She and her business ([employer]) recently was featured in [place]   Relevant past medical, surgical, family and social history reviewed and updated as indicated. Interim medical history since our last visit reviewed. Allergies and medications reviewed and updated. Current Outpatient Prescriptions on File Prior to Visit  Medication Sig  . acetaminophen (TYLENOL) 500 MG tablet Take 500 mg by mouth every 6 (six) hours as needed for mild pain.  Marland Kitchen acidophilus (RISAQUAD) CAPS capsule Take 1 capsule by mouth daily.  Marland Kitchen ALPRAZolam (XANAX) 1 MG tablet Take 0.5 mg by mouth 4 (four) times daily as needed for anxiety.   Marland Kitchen aspirin EC 81 MG tablet Take 81 mg by mouth daily.  Marland Kitchen Dexlansoprazole (DEXILANT) 30 MG capsule Take 1 capsule (30 mg total) by mouth daily.  . ferrous sulfate 325 (65 FE) MG tablet Take 1 tablet (325 mg total) by mouth daily with breakfast.  . furosemide (LASIX) 20 MG tablet Take 20 mg by mouth daily.  Marland Kitchen glucosamine-chondroitin 500-400 MG tablet Take 1 tablet by mouth daily.   Marland Kitchen HYDROcodone-acetaminophen (NORCO/VICODIN)  5-325 MG tablet Take 0.5-1 tablets by mouth every 6 (six) hours as needed for moderate pain.  Marland Kitchen loperamide (IMODIUM) 2 MG capsule Take 2 mg by mouth 4 (four) times daily as needed for diarrhea or loose stools.  . metoprolol tartrate (LOPRESSOR) 25 MG tablet Take 1.5 tablets (37.5 mg total) by mouth 2 (two) times daily.  . nitroGLYCERIN (NITROSTAT) 0.4 MG SL tablet Place 0.4 mg under the tongue every 5 (five) minutes as needed for chest pain.   Marland Kitchen nystatin-triamcinolone ointment (MYCOLOG) Apply 1 application topically 2 (two) times daily as needed (for itching).  . nystatin-triamcinolone ointment (MYCOLOG) APPLY 1 APPLICATION TOPICALLY 2 (TWO) TIMES DAILY. AS NEEDED FOR ITCHING  . pioglitazone (ACTOS) 30 MG tablet Take 30 mg by mouth every evening.   . pravastatin (PRAVACHOL) 20 MG tablet Take 20 mg by mouth at bedtime.  . ramipril (ALTACE) 5 MG capsule Take 5 mg by mouth every evening.   . traZODone (DESYREL) 150 MG tablet Take 150 mg by mouth at bedtime.   . triamcinolone cream (KENALOG) 0.1 % APPLY 1 APPLICATION TOPICALLY 2 (TWO) TIMES DAILY.  . vitamin B-12 (CYANOCOBALAMIN) 1000 MCG tablet Take 1,000 mcg by mouth daily.  . vitamin E 400 UNIT capsule Take 400 Units by mouth daily.   No current facility-administered medications on file prior to visit.     Review of Systems Per HPI unless specifically indicated in ROS section     Objective:  BP 128/68   Pulse 60   Temp 98.2 F (36.8 C) (Oral)   Wt 144 lb 8 oz (65.5 kg)   SpO2 96%   BMI 26.43 kg/m   Wt Readings from Last 3 Encounters:  11/06/15 144 lb 8 oz (65.5 kg)  10/19/15 145 lb 12.8 oz (66.1 kg)  10/13/15 145 lb (65.8 kg)    Physical Exam  Constitutional: She appears well-developed and well-nourished. No distress.  Cardiovascular: Normal rate, regular rhythm, normal heart sounds and intact distal pulses.   No murmur heard. Pulmonary/Chest: Effort normal and breath sounds normal. No respiratory distress. She has no  wheezes. She has no rales.  Musculoskeletal: She exhibits no edema.  Light compression stockings in place  Skin: Skin is warm and dry. No rash noted.  Psychiatric: She has a normal mood and affect.  Nursing note and vitals reviewed.     Assessment & Plan:   Problem List Items Addressed This Visit    (HFpEF) heart failure with preserved ejection fraction ([place])    euvolemic today.       GAD (generalized anxiety disorder)    Declines daily anxiety medication besides xanax.       Hypertension    Chronic, stable. Continue current regimen. Overall doing well.       Leg pain, bilateral    Bilateral pain from CVI and known PAD/PVD.  Continue hydrocodone prn.      Paroxysmal atrial fibrillation ([place])    Sounds regular today. Continue metoprolol and aspirin. High bleed risk due to h/o falls, PUD, variceal leg bleeds.      Pedal edema    No significant edema seen today - overall doing well.      Urinary urgency - Primary    Describes ongoing urinary frequency and urgency with new BRB with wiping noted today. Check UA.        Other Visit Diagnoses   None.      Follow up plan: Return in about 4 weeks (around 12/04/2015).  Ria Bush, MD

## 2015-11-06 NOTE — Assessment & Plan Note (Signed)
Chronic, stable. Continue current regimen. Overall doing well.

## 2015-11-06 NOTE — Progress Notes (Signed)
Pre visit review using our clinic review tool, if applicable. No additional management support is needed unless otherwise documented below in the visit note. 

## 2015-11-09 ENCOUNTER — Encounter: Payer: Self-pay | Admitting: *Deleted

## 2015-11-09 DIAGNOSIS — I2510 Atherosclerotic heart disease of native coronary artery without angina pectoris: Secondary | ICD-10-CM | POA: Diagnosis not present

## 2015-11-09 DIAGNOSIS — I110 Hypertensive heart disease with heart failure: Secondary | ICD-10-CM | POA: Diagnosis not present

## 2015-11-09 DIAGNOSIS — I480 Paroxysmal atrial fibrillation: Secondary | ICD-10-CM | POA: Diagnosis not present

## 2015-11-09 DIAGNOSIS — I5033 Acute on chronic diastolic (congestive) heart failure: Secondary | ICD-10-CM | POA: Diagnosis not present

## 2015-11-09 DIAGNOSIS — E1142 Type 2 diabetes mellitus with diabetic polyneuropathy: Secondary | ICD-10-CM | POA: Diagnosis not present

## 2015-11-09 DIAGNOSIS — G9340 Encephalopathy, unspecified: Secondary | ICD-10-CM | POA: Diagnosis not present

## 2015-11-11 DIAGNOSIS — I5033 Acute on chronic diastolic (congestive) heart failure: Secondary | ICD-10-CM | POA: Diagnosis not present

## 2015-11-11 DIAGNOSIS — I480 Paroxysmal atrial fibrillation: Secondary | ICD-10-CM | POA: Diagnosis not present

## 2015-11-11 DIAGNOSIS — E1142 Type 2 diabetes mellitus with diabetic polyneuropathy: Secondary | ICD-10-CM | POA: Diagnosis not present

## 2015-11-11 DIAGNOSIS — G9340 Encephalopathy, unspecified: Secondary | ICD-10-CM | POA: Diagnosis not present

## 2015-11-11 DIAGNOSIS — I110 Hypertensive heart disease with heart failure: Secondary | ICD-10-CM | POA: Diagnosis not present

## 2015-11-11 DIAGNOSIS — I2510 Atherosclerotic heart disease of native coronary artery without angina pectoris: Secondary | ICD-10-CM | POA: Diagnosis not present

## 2015-11-11 NOTE — Telephone Encounter (Signed)
Ria Comment, [facility] nurse saw pt today, BP 98/56. Temperature 97.9, pt feeling weak.  Ria Comment would like to know results of urine when pt seen last week.  Best number to call Ria Comment is [phone]

## 2015-11-12 NOTE — Telephone Encounter (Signed)
Message left advising [name] Comment that UA was normal and that letter had been mailed notifying patient. [name] to return my call.

## 2015-11-16 ENCOUNTER — Ambulatory Visit (INDEPENDENT_AMBULATORY_CARE_PROVIDER_SITE_OTHER): Payer: Medicare Other

## 2015-11-16 ENCOUNTER — Other Ambulatory Visit: Payer: Self-pay | Admitting: Family Medicine

## 2015-11-16 VITALS — BP 126/70 | HR 57 | Temp 97.8°F | Ht <= 58 in | Wt 143.5 lb

## 2015-11-16 DIAGNOSIS — Z23 Encounter for immunization: Secondary | ICD-10-CM | POA: Diagnosis not present

## 2015-11-16 DIAGNOSIS — Z0000 Encounter for general adult medical examination without abnormal findings: Secondary | ICD-10-CM | POA: Diagnosis not present

## 2015-11-16 NOTE — Progress Notes (Signed)
Subjective:   [name] is a 82 y.o. female who presents for Medicare Annual (Subsequent) preventive examination.  Review of Systems:  N/A Cardiac Risk Factors include: advanced age (>55men, >59 women);diabetes mellitus;hypertension;dyslipidemia;obesity (BMI >30kg/m2)     Objective:     Vitals: BP 126/70 (BP Location: Left Arm, Patient Position: Sitting, Cuff Size: Normal)   Pulse (!) 57   Temp 97.8 F (36.6 C) (Oral)   Ht 4' 9.5" (1.461 m) Comment: shoes  Wt 143 lb 8 oz (65.1 kg)   SpO2 98%   BMI 30.52 kg/m   Body mass index is 30.52 kg/m.   Tobacco History  Smoking Status  . Former Smoker  . Quit date: 03/05/1979  Smokeless Tobacco  . Never Used     Counseling given: No   Past Medical History:  Diagnosis Date  . Acute deep vein thrombosis (DVT) of distal vein of right lower extremity ([place]) 12/2013   Acute thrombus of R proximal gastrocnemius vein. Symptomatic provoked distal DVT  . Ankle wound   . Anxiety   . Cellulitis of leg, left 01/16/2014  . Chronic back pain 03/2012   lumbar DDD with herniation - s/p L S1 transforaminal ESI (10/2012 Dr. Sharlet Salina)  . Chronic venous insufficiency   . Coronary artery disease    CABG 1998  . Cystocele   . Depression   . Diabetes mellitus    type 2, followed by endo.  . Diastolic dysfunction   . Diverticulosis   . Hemorrhoids   . History of heart attack   . Hx of echocardiogram    a. Echo 10/13: mild LVH, EF 55-60%, Gr 1 diast dysfn, PASP 32  . Hyperlipidemia   . Hypertension   . Irritable bowel syndrome   . Microcytic anemia   . PAD (peripheral artery disease) ([place]) 2013   ABI: R 0.91, L 0.83  . PUD (peptic ulcer disease)   . Varicose veins   . Viral gastroenteritis due to [place] virus 05/13/2015   hospitalization   Past Surgical History:  Procedure Laterality Date  . ABDOMINAL HYSTERECTOMY  1975   TAH,BSO  . ABI  2013   R 0.91, L 0.83  . APPENDECTOMY     per pt report  . CARDIOVASCULAR STRESS TEST   2015   low risk myoview ([place])  . CATARACT SURGERY    . CHOLECYSTECTOMY    . COLONOSCOPY  02/2010   extensive diverticulosis throughout, small internal hemorrhoids, rec rpt 5 yrs (Dr. Allyn Kenner)  . CORONARY ARTERY BYPASS GRAFT  1998  . dexa scan  09/2011   femur -0.8, forearm 0.6 => normal  . ENDOVENOUS ABLATION SAPHENOUS VEIN W/ LASER Left 03/2014   Kellie Simmering  . ESI  2014   L S1 transforaminal ESI x2 ([place]) without improvement  . HAND SURGERY    . lexiscan myoview  03/2011   Small basal inferolateral and anterolateral reversible perfusion defect suggests ischemia.  EF was normal.   old infarct, no new ischemia  . toe surgery    . TYMPANOPLASTY  1960s   R side  . Vault prolapse A & P repair  2009   Dr Rhodia Albright [facility]   Family History  Problem Relation Age of Onset  . Hypertension Mother   . Stroke Mother   . Diabetes Father   . Diabetes Sister   . Diabetes Brother   . Colon cancer Neg Hx    History  Sexual Activity  . Sexual activity: No  Comment: 1st intercourse 82yo.--- 2 sexual partners    Outpatient Encounter Prescriptions as of 11/16/2015  Medication Sig  . acetaminophen (TYLENOL) 500 MG tablet Take 500 mg by mouth every 6 (six) hours as needed for mild pain.  Marland Kitchen acidophilus (RISAQUAD) CAPS capsule Take 1 capsule by mouth daily.  Marland Kitchen ALPRAZolam (XANAX) 1 MG tablet Take 0.5 mg by mouth 4 (four) times daily as needed for anxiety.   Marland Kitchen aspirin EC 81 MG tablet Take 81 mg by mouth daily.  Marland Kitchen Dexlansoprazole (DEXILANT) 30 MG capsule Take 1 capsule (30 mg total) by mouth daily.  . ferrous sulfate 325 (65 FE) MG tablet Take 1 tablet (325 mg total) by mouth daily with breakfast.  . furosemide (LASIX) 20 MG tablet TAKE 1 TABLET (20 MG TOTAL) BY MOUTH DAILY. FOR LEG SWELLING  . glucosamine-chondroitin 500-400 MG tablet Take 1 tablet by mouth daily.   Marland Kitchen HYDROcodone-acetaminophen (NORCO/VICODIN) 5-325 MG tablet Take 0.5-1 tablets by mouth every 6 (six) hours as needed for  moderate pain.  Marland Kitchen loperamide (IMODIUM) 2 MG capsule Take 2 mg by mouth 4 (four) times daily as needed for diarrhea or loose stools.  . metoprolol tartrate (LOPRESSOR) 25 MG tablet Take 1.5 tablets (37.5 mg total) by mouth 2 (two) times daily.  . nitroGLYCERIN (NITROSTAT) 0.4 MG SL tablet Place 0.4 mg under the tongue every 5 (five) minutes as needed for chest pain.   Marland Kitchen nystatin-triamcinolone ointment (MYCOLOG) APPLY 1 APPLICATION TOPICALLY 2 (TWO) TIMES DAILY. AS NEEDED FOR ITCHING  . pioglitazone (ACTOS) 30 MG tablet Take 30 mg by mouth every evening.   . pravastatin (PRAVACHOL) 20 MG tablet Take 20 mg by mouth at bedtime.  . ramipril (ALTACE) 5 MG capsule Take 5 mg by mouth every evening.   . traZODone (DESYREL) 150 MG tablet Take 150 mg by mouth at bedtime.   . triamcinolone cream (KENALOG) 0.1 % APPLY 1 APPLICATION TOPICALLY 2 (TWO) TIMES DAILY.  . vitamin B-12 (CYANOCOBALAMIN) 1000 MCG tablet Take 1,000 mcg by mouth daily.  . vitamin E 400 UNIT capsule Take 400 Units by mouth daily.  . [DISCONTINUED] furosemide (LASIX) 20 MG tablet Take 20 mg by mouth daily.  . [DISCONTINUED] nystatin-triamcinolone ointment (MYCOLOG) Apply 1 application topically 2 (two) times daily as needed (for itching).   No facility-administered encounter medications on file as of 11/16/2015.     Activities of Daily Living In your present state of health, do you have any difficulty performing the following activities: 11/16/2015 09/28/2015  Hearing? N N  Vision? N N  Difficulty concentrating or making decisions? Y N  Walking or climbing stairs? Y Y  Dressing or bathing? N N  Doing errands, shopping? Y N  Preparing Food and eating ? N -  Using the Toilet? N -  In the past six months, have you accidently leaked urine? N -  Do you have problems with loss of bowel control? N -  Managing your Medications? N -  Managing your Finances? N -  Housekeeping or managing your Housekeeping? N -  Some recent data might be  hidden    Patient Care Team: Ria Bush, MD as PCP - General (Family Medicine)    Assessment:     Hearing Screening   125Hz  250Hz  500Hz  1000Hz  2000Hz  3000Hz  4000Hz  6000Hz  8000Hz   Right ear:   0 0 0  0    Left ear:   0 0 0  0      Visual Acuity Screening   Right  eye Left eye Both eyes  Without correction: 20/50 20/50 20/50   With correction:       Exercise Activities and Dietary recommendations Current Exercise Habits: Home exercise routine, Type of exercise: walking;Other - see comments (chair exercises), Time (Minutes): 30, Frequency (Times/Week): 7, Weekly Exercise (Minutes/Week): 210, Intensity: Mild, Exercise limited by: orthopedic condition(s)  Goals    . Increase physical activity          Starting 11/16/2015, I will continue to do chair exercises and walk daily for at least 30 min.       Fall Risk Fall Risk  11/16/2015 06/17/2013 12/13/2011  Falls in the past year? Yes Yes -  Number falls in past yr: 2 or more 1 -  Injury with Fall? Yes Yes -  Risk Factor Category  High Fall Risk High Fall Risk -  Risk for fall due to : History of fall(s);Impaired balance/gait;Impaired mobility Impaired mobility History of fall(s);Impaired mobility  Follow up Falls evaluation completed;Falls prevention discussed;Education provided - -   Depression Screen PHQ 2/9 Scores 11/16/2015 06/17/2013  PHQ - 2 Score 1 3  PHQ- 9 Score - 7     Cognitive Testing MMSE - Mini Mental State Exam 11/16/2015  Orientation to time 5  Orientation to Place 5  Registration 3  Attention/ Calculation 0  Recall 3  Language- name 2 objects 0  Language- repeat 1  Language- follow 3 step command 0  Language- follow 3 step command-comments pt was unable to follow 3 of 3 step command  Language- read & follow direction 0  Write a sentence 0  Copy design 0  Total score 17   PLEASE NOTE: A Mini-Cog screen was completed. Maximum score is 20. A value of 0 denotes this part of [place] MMSE was not  completed or the patient failed this part of the Mini-Cog screening.   Mini-Cog Screening Orientation to Time - Max 5 pts Orientation to Place - Max 5 pts Registration - Max 3 pts Recall - Max 3 pts Language Repeat - Max 1 pts Language Follow 3 Step Command - Max 3 pts   Immunization History  Administered Date(s) Administered  . Influenza Split 12/20/2010  . Influenza, High Dose Seasonal PF 03/02/2015  . Influenza,inj,Quad PF,36+ Mos 01/01/2013, 12/12/2013  . Pneumococcal Conjugate-13 06/17/2013  . Pneumococcal Polysaccharide-23 11/16/2015  . Zoster 09/06/2010   Screening Tests Health Maintenance  Topic Date Due  . FOOT EXAM  12/15/2015 (Originally 06/09/2015)  . INFLUENZA VACCINE  04/03/2016 (Originally 11/03/2015)  . OPHTHALMOLOGY EXAM  11/15/2016 (Originally 06/13/2014)  . COLONOSCOPY  11/15/2016 (Originally 02/12/2015)  . DTaP/Tdap/Td (1 - Tdap) 09/06/2019 (Originally 06/06/1946)  . HEMOGLOBIN A1C  03/28/2016  . TETANUS/TDAP  09/06/2019  . DEXA SCAN  Completed  . ZOSTAVAX  Completed  . PNA vac Low Risk Adult  Completed      Plan:     I have personally reviewed and addressed the Medicare Annual Wellness questionnaire and have noted the following in the patient's chart:  A. Medical and social history B. Use of alcohol, tobacco or illicit drugs  C. Current medications and supplements D. Functional ability and status E.  Nutritional status F.  Physical activity G. Advance directives H. List of other physicians I.  Hospitalizations, surgeries, and ER visits in previous 12 months J.  [place] to include hearing, vision, cognitive, depression L. Referrals and appointments - none  In addition, I have reviewed and discussed with patient certain preventive protocols, quality metrics, and  best practice recommendations. A written personalized care plan for preventive services as well as general preventive health recommendations were provided to patient.  See attached  scanned questionnaire for additional information.   Signed,   Lindell Noe, MHA, BS, LPN Health Advisor

## 2015-11-16 NOTE — Progress Notes (Signed)
I reviewed health advisor's note, was available for consultation, and agree with documentation and plan.  

## 2015-11-16 NOTE — Progress Notes (Signed)
PCP notes:   Health maintenance:  PPSV23 - administered Eye exam - pt will schedule Colonoscopy - pt will schedule  Abnormal screenings:   Depression - PHQ2 score: 1 Fall risk - hx of multiple falls with injury Hearing - failed  Patient concerns:   None  Nurse concerns:  None  Next PCP appt:   12/15/2015 @ 1200

## 2015-11-16 NOTE — Patient Instructions (Addendum)
[name] , Thank you for taking time to come for your Medicare Wellness Visit. I appreciate your ongoing commitment to your health goals. Please review the following plan we discussed and let me know if I can assist you in the future.   These are the goals we discussed: Goals    . Increase physical activity          Starting 11/16/2015, I will continue to do chair exercises and walk daily for at least 30 min.        This is a list of the screening recommended for you and due dates:  Health Maintenance  Topic Date Due  . Complete foot exam   12/15/2015*  . Flu Shot  04/03/2016*  . Eye exam for diabetics  11/15/2016*  . Colon Cancer Screening  11/15/2016*  . DTaP/Tdap/Td vaccine (1 - Tdap) 09/06/2019*  . Hemoglobin A1C  03/28/2016  . Tetanus Vaccine  09/06/2019  . DEXA scan (bone density measurement)  Completed  . Shingles Vaccine  Completed  . Pneumonia vaccines  Completed  *Topic was postponed. The date shown is not the original due date.   Preventive Care for Adults  A healthy lifestyle and preventive care can promote health and wellness. Preventive health guidelines for adults include the following key practices.  . A routine yearly physical is a good way to check with your health care provider about your health and preventive screening. It is a chance to share any concerns and updates on your health and to receive a thorough exam.  . Visit your dentist for a routine exam and preventive care every 6 months. Brush your teeth twice a day and floss once a day. Good oral hygiene prevents tooth decay and gum disease.  . The frequency of eye exams is based on your age, health, family medical history, use  of contact lenses, and other factors. Follow your health care provider's ecommendations for frequency of eye exams.  . Eat a healthy diet. Foods like vegetables, fruits, whole grains, low-fat dairy products, and lean protein foods contain the nutrients you need without too many  calories. Decrease your intake of foods high in solid fats, added sugars, and salt. Eat the right amount of calories for you. Get information about a proper diet from your health care provider, if necessary.  . Regular physical exercise is one of the most important things you can do for your health. Most adults should get at least 150 minutes of moderate-intensity exercise (any activity that increases your heart rate and causes you to sweat) each week. In addition, most adults need muscle-strengthening exercises on 2 or more days a week.  Silver Sneakers may be a benefit available to you. To determine eligibility, you may visit the website: www.silversneakers.com or contact program at 5758433625 Mon-Fri between 8AM-8PM.   . Maintain a healthy weight. The body mass index (BMI) is a screening tool to identify possible weight problems. It provides an estimate of body fat based on height and weight. Your health care provider can find your BMI and can help you achieve or maintain a healthy weight.   For adults 20 years and older: ? A BMI below 18.5 is considered underweight. ? A BMI of 18.5 to 24.9 is normal. ? A BMI of 25 to 29.9 is considered overweight. ? A BMI of 30 and above is considered obese.   . Maintain normal blood lipids and cholesterol levels by exercising and minimizing your intake of saturated fat. Eat a balanced  diet with plenty of fruit and vegetables. Blood tests for lipids and cholesterol should begin at age 39 and be repeated every 5 years. If your lipid or cholesterol levels are high, you are over 50, or you are at high risk for heart disease, you may need your cholesterol levels checked more frequently. Ongoing high lipid and cholesterol levels should be treated with medicines if diet and exercise are not working.  . If you smoke, find out from your health care provider how to quit. If you do not use tobacco, please do not start.  . If you choose to drink alcohol, please do  not consume more than 2 drinks per day. One drink is considered to be 12 ounces (355 mL) of beer, 5 ounces (148 mL) of wine, or 1.5 ounces (44 mL) of liquor.  . If you are 42-15 years old, ask your health care provider if you should take aspirin to prevent strokes.  . Use sunscreen. Apply sunscreen liberally and repeatedly throughout the day. You should seek shade when your shadow is shorter than you. Protect yourself by wearing long sleeves, pants, a wide-brimmed hat, and sunglasses year round, whenever you are outdoors.  . Once a month, do a whole body skin exam, using a mirror to look at the skin on your back. Tell your health care provider of new moles, moles that have irregular borders, moles that are larger than a pencil eraser, or moles that have changed in shape or color.      Fall Prevention in the Home  Falls can cause injuries. They can happen to people of all ages. There are many things you can do to make your home safe and to help prevent falls.  WHAT CAN I DO ON THE OUTSIDE OF MY HOME?  Regularly fix the edges of walkways and driveways and fix any cracks.  Remove anything that might make you trip as you walk through a door, such as a raised step or threshold.  Trim any bushes or trees on the path to your home.  Use bright outdoor lighting.  Clear any walking paths of anything that might make someone trip, such as rocks or tools.  Regularly check to see if handrails are loose or broken. Make sure that both sides of any steps have handrails.  Any raised decks and porches should have guardrails on the edges.  Have any leaves, snow, or ice cleared regularly.  Use sand or salt on walking paths during winter.  Clean up any spills in your garage right away. This includes oil or grease spills. WHAT CAN I DO IN THE BATHROOM?   Use night lights.  Install grab bars by the toilet and in the tub and shower. Do not use towel bars as grab bars.  Use non-skid mats or decals in  the tub or shower.  If you need to sit down in the shower, use a plastic, non-slip stool.  Keep the floor dry. Clean up any water that spills on the floor as soon as it happens.  Remove soap buildup in the tub or shower regularly.  Attach bath mats securely with double-sided non-slip rug tape.  Do not have throw rugs and other things on the floor that can make you trip. WHAT CAN I DO IN THE BEDROOM?  Use night lights.  Make sure that you have a light by your bed that is easy to reach.  Do not use any sheets or blankets that are too big for your bed.  They should not hang down onto the floor.  Have a firm chair that has side arms. You can use this for support while you get dressed.  Do not have throw rugs and other things on the floor that can make you trip. WHAT CAN I DO IN THE KITCHEN?  Clean up any spills right away.  Avoid walking on wet floors.  Keep items that you use a lot in easy-to-reach places.  If you need to reach something above you, use a strong step stool that has a grab bar.  Keep electrical cords out of the way.  Do not use floor polish or wax that makes floors slippery. If you must use wax, use non-skid floor wax.  Do not have throw rugs and other things on the floor that can make you trip. WHAT CAN I DO WITH MY STAIRS?  Do not leave any items on the stairs.  Make sure that there are handrails on both sides of the stairs and use them. Fix handrails that are broken or loose. Make sure that handrails are as long as the stairways.  Check any carpeting to make sure that it is firmly attached to the stairs. Fix any carpet that is loose or worn.  Avoid having throw rugs at the top or bottom of the stairs. If you do have throw rugs, attach them to the floor with carpet tape.  Make sure that you have a light switch at the top of the stairs and the bottom of the stairs. If you do not have them, ask someone to add them for you. WHAT ELSE CAN I DO TO HELP PREVENT  FALLS?  Wear shoes that:  Do not have high heels.  Have rubber bottoms.  Are comfortable and fit you well.  Are closed at the toe. Do not wear sandals.  If you use a stepladder:  Make sure that it is fully opened. Do not climb a closed stepladder.  Make sure that both sides of the stepladder are locked into place.  Ask someone to hold it for you, if possible.  Clearly mark and make sure that you can see:  Any grab bars or handrails.  First and last steps.  Where the edge of each step is.  Use tools that help you move around (mobility aids) if they are needed. These include:  Canes.  Walkers.  Scooters.  Crutches.  Turn on the lights when you go into a dark area. Replace any light bulbs as soon as they burn out.  Set up your furniture so you have a clear path. Avoid moving your furniture around.  If any of your floors are uneven, fix them.  If there are any pets around you, be aware of where they are.  Review your medicines with your doctor. Some medicines can make you feel dizzy. This can increase your chance of falling. Ask your doctor what other things that you can do to help prevent falls.   This information is not intended to replace advice given to you by your health care provider. Make sure you discuss any questions you have with your health care provider.   Document Released: 01/15/2009 Document Revised: 08/05/2014 Document Reviewed: 04/25/2014 Elsevier Interactive Patient Education [employer].

## 2015-11-16 NOTE — Progress Notes (Signed)
Pre visit review using our clinic review tool, if applicable. No additional management support is needed unless otherwise documented below in the visit note. 

## 2015-11-18 DIAGNOSIS — I480 Paroxysmal atrial fibrillation: Secondary | ICD-10-CM | POA: Diagnosis not present

## 2015-11-18 DIAGNOSIS — I110 Hypertensive heart disease with heart failure: Secondary | ICD-10-CM | POA: Diagnosis not present

## 2015-11-18 DIAGNOSIS — E1142 Type 2 diabetes mellitus with diabetic polyneuropathy: Secondary | ICD-10-CM | POA: Diagnosis not present

## 2015-11-18 DIAGNOSIS — G9340 Encephalopathy, unspecified: Secondary | ICD-10-CM | POA: Diagnosis not present

## 2015-11-18 DIAGNOSIS — I2510 Atherosclerotic heart disease of native coronary artery without angina pectoris: Secondary | ICD-10-CM | POA: Diagnosis not present

## 2015-11-18 DIAGNOSIS — I5033 Acute on chronic diastolic (congestive) heart failure: Secondary | ICD-10-CM | POA: Diagnosis not present

## 2015-12-01 DIAGNOSIS — I2510 Atherosclerotic heart disease of native coronary artery without angina pectoris: Secondary | ICD-10-CM | POA: Diagnosis not present

## 2015-12-01 DIAGNOSIS — G9340 Encephalopathy, unspecified: Secondary | ICD-10-CM | POA: Diagnosis not present

## 2015-12-01 DIAGNOSIS — I110 Hypertensive heart disease with heart failure: Secondary | ICD-10-CM | POA: Diagnosis not present

## 2015-12-01 DIAGNOSIS — E1142 Type 2 diabetes mellitus with diabetic polyneuropathy: Secondary | ICD-10-CM | POA: Diagnosis not present

## 2015-12-01 DIAGNOSIS — I5033 Acute on chronic diastolic (congestive) heart failure: Secondary | ICD-10-CM | POA: Diagnosis not present

## 2015-12-01 DIAGNOSIS — I480 Paroxysmal atrial fibrillation: Secondary | ICD-10-CM | POA: Diagnosis not present

## 2015-12-10 ENCOUNTER — Other Ambulatory Visit: Payer: Self-pay

## 2015-12-10 MED ORDER — HYDROCODONE-ACETAMINOPHEN 5-325 MG PO TABS: 0.5000 | ORAL_TABLET | Freq: Four times a day (QID) | ORAL | 0 refills | Status: DC | PRN

## 2015-12-10 NOTE — Telephone Encounter (Signed)
Pt left v/m requesting rx hydrocodone apap. Call when ready for pick up. Last printed # 60 on 10/23/15. Last seen 11/06/15. Pt is out of med and request cb ASAP.

## 2015-12-10 NOTE — Telephone Encounter (Signed)
Rx placed up front for pick up and patient notified.

## 2015-12-10 NOTE — Telephone Encounter (Signed)
Printed and in Kim's box 

## 2015-12-15 ENCOUNTER — Encounter: Payer: Self-pay | Admitting: Family Medicine

## 2015-12-15 ENCOUNTER — Ambulatory Visit (INDEPENDENT_AMBULATORY_CARE_PROVIDER_SITE_OTHER): Payer: Medicare Other | Admitting: Family Medicine

## 2015-12-15 VITALS — BP 108/58 | HR 68 | Temp 98.1°F | Wt 144.2 lb

## 2015-12-15 DIAGNOSIS — I83009 Varicose veins of unspecified lower extremity with ulcer of unspecified site: Secondary | ICD-10-CM

## 2015-12-15 DIAGNOSIS — I10 Essential (primary) hypertension: Secondary | ICD-10-CM

## 2015-12-15 DIAGNOSIS — F411 Generalized anxiety disorder: Secondary | ICD-10-CM

## 2015-12-15 DIAGNOSIS — R600 Localized edema: Secondary | ICD-10-CM

## 2015-12-15 DIAGNOSIS — Z23 Encounter for immunization: Secondary | ICD-10-CM | POA: Diagnosis not present

## 2015-12-15 DIAGNOSIS — I5030 Unspecified diastolic (congestive) heart failure: Secondary | ICD-10-CM

## 2015-12-15 DIAGNOSIS — I480 Paroxysmal atrial fibrillation: Secondary | ICD-10-CM

## 2015-12-15 DIAGNOSIS — I2510 Atherosclerotic heart disease of native coronary artery without angina pectoris: Secondary | ICD-10-CM | POA: Diagnosis not present

## 2015-12-15 DIAGNOSIS — R21 Rash and other nonspecific skin eruption: Secondary | ICD-10-CM

## 2015-12-15 DIAGNOSIS — E1151 Type 2 diabetes mellitus with diabetic peripheral angiopathy without gangrene: Secondary | ICD-10-CM

## 2015-12-15 DIAGNOSIS — R531 Weakness: Secondary | ICD-10-CM

## 2015-12-15 DIAGNOSIS — I872 Venous insufficiency (chronic) (peripheral): Secondary | ICD-10-CM

## 2015-12-15 DIAGNOSIS — L97909 Non-pressure chronic ulcer of unspecified part of unspecified lower leg with unspecified severity: Secondary | ICD-10-CM

## 2015-12-15 MED ORDER — FUROSEMIDE 20 MG PO TABS: 10.0000 mg | ORAL_TABLET | Freq: Every day | ORAL | 3 refills | Status: DC

## 2015-12-15 NOTE — Assessment & Plan Note (Signed)
euvolemic today.

## 2015-12-15 NOTE — Assessment & Plan Note (Addendum)
Pt desires to return to work at [employer]. Discussed return to work 1/2 days at a time.

## 2015-12-15 NOTE — Progress Notes (Signed)
Pre visit review using our clinic review tool, if applicable. No additional management support is needed unless otherwise documented below in the visit note. 

## 2015-12-15 NOTE — Assessment & Plan Note (Signed)
Stable period.  

## 2015-12-15 NOTE — Assessment & Plan Note (Signed)
Sounds regular today. Continue metoprolol. Avoid anticoagulation given h/o varicose bleeds and anemia, PUD, high fall risk.

## 2015-12-15 NOTE — Assessment & Plan Note (Signed)
R foot rash from bug bites after recent exposure outdoors. Discussed supportive treatment - use steroid cream she has at home which is helping (pt doesn't know name).

## 2015-12-15 NOTE — Assessment & Plan Note (Signed)
Stable period. She has done well with HH. Requests sooner appt with me for f/u.

## 2015-12-15 NOTE — Assessment & Plan Note (Addendum)
Hypotensive but asymptomatic today, improved on recheck. Will decrease lasix to 10mg  daily, continue metoprolol 50mg  bid and ramipril 5mg  capsule.

## 2015-12-15 NOTE — Progress Notes (Signed)
BP (!) 108/58 (BP Location: Right Arm, Cuff Size: Normal)   Pulse 68   Temp 98.1 F (36.7 C) (Oral)   Wt 144 lb 4 oz (65.4 kg)   SpO2 94%   BMI 30.67 kg/m    CC: evaluate R foot/leg Subjective:    [name], female    DOB: 07/26/1927, 82 y.o.   MRN: [id number]  HPI: [name] is a 82 y.o. female presenting on 12/15/2015 for Follow-up (right foot/leg ulcer)   Saw Katha Cabal 2 wks ago for medicare wellness visit. Note reviewed.   Recent hospitalization 09/2015 for UTI, HFpEF exacerbation and afib with RVR. Known PAD, PVD. Regularly uses compression stockings. Patient was prescribed metoprolol 37.5mg  bid but has only been taking 25mg  bid. Endorses some intermittent palpitations.   Few itchy spots on feet that started after outdoors in yard - ?bug bites. Worried because of leg vein history (h/o varicose bleed).  In the hospital, she was seen by Dr Kellie Simmering who did not recommend any further intervention for her chronic painful varicosities. She did have ablation of L greater saphenous vein. ABI showed bilateral moderate arterial insufficiency at rest. Having trouble placing heavy compression stockings on.   BP Readings from Last 3 Encounters:  12/15/15 (!) 108/58  11/16/15 126/70  11/06/15 128/68  Endorses ongoing chronic dizziness.   Off work for last 10 wks. Wants to return to work. Completed HH PT/OT.   Relevant past medical, surgical, family and social history reviewed and updated as indicated. Interim medical history since our last visit reviewed. Allergies and medications reviewed and updated. Current Outpatient Prescriptions on File Prior to Visit  Medication Sig  . acetaminophen (TYLENOL) 500 MG tablet Take 500 mg by mouth every 6 (six) hours as needed for mild pain.  Marland Kitchen ALPRAZolam (XANAX) 1 MG tablet Take 0.5 mg by mouth 4 (four) times daily as needed for anxiety.   Marland Kitchen aspirin EC 81 MG tablet Take 81 mg by mouth daily.  Marland Kitchen Dexlansoprazole (DEXILANT) 30 MG  capsule Take 1 capsule (30 mg total) by mouth daily.  . ferrous sulfate 325 (65 FE) MG tablet Take 1 tablet (325 mg total) by mouth daily with breakfast.  . glucosamine-chondroitin 500-400 MG tablet Take 1 tablet by mouth daily.   Marland Kitchen HYDROcodone-acetaminophen (NORCO/VICODIN) 5-325 MG tablet Take 0.5-1 tablets by mouth every 6 (six) hours as needed for moderate pain.  Marland Kitchen loperamide (IMODIUM) 2 MG capsule Take 2 mg by mouth 4 (four) times daily as needed for diarrhea or loose stools.  . nitroGLYCERIN (NITROSTAT) 0.4 MG SL tablet Place 0.4 mg under the tongue every 5 (five) minutes as needed for chest pain.   Marland Kitchen nystatin-triamcinolone ointment (MYCOLOG) APPLY 1 APPLICATION TOPICALLY 2 (TWO) TIMES DAILY. AS NEEDED FOR ITCHING  . pioglitazone (ACTOS) 30 MG tablet Take 30 mg by mouth every evening.   . pravastatin (PRAVACHOL) 20 MG tablet Take 20 mg by mouth at bedtime.  . ramipril (ALTACE) 5 MG capsule Take 5 mg by mouth every evening.   . traZODone (DESYREL) 150 MG tablet Take 150 mg by mouth at bedtime.   . triamcinolone cream (KENALOG) 0.1 % APPLY 1 APPLICATION TOPICALLY 2 (TWO) TIMES DAILY.  . vitamin B-12 (CYANOCOBALAMIN) 1000 MCG tablet Take 1,000 mcg by mouth daily.  . vitamin E 400 UNIT capsule Take 400 Units by mouth daily.  Marland Kitchen acidophilus (RISAQUAD) CAPS capsule Take 1 capsule by mouth daily.   No current facility-administered medications on file prior to  visit.     Review of Systems Per HPI unless specifically indicated in ROS section     Objective:    BP (!) 108/58 (BP Location: Right Arm, Cuff Size: Normal)   Pulse 68   Temp 98.1 F (36.7 C) (Oral)   Wt 144 lb 4 oz (65.4 kg)   SpO2 94%   BMI 30.67 kg/m   Wt Readings from Last 3 Encounters:  12/15/15 144 lb 4 oz (65.4 kg)  11/16/15 143 lb 8 oz (65.1 kg)  11/06/15 144 lb 8 oz (65.5 kg)    Physical Exam  Constitutional: She appears well-developed and well-nourished. No distress.  HENT:  Head: Normocephalic and atraumatic.    Mouth/Throat: Oropharynx is clear and moist. No oropharyngeal exudate.  Eyes: Conjunctivae and EOM are normal. Pupils are equal, round, and reactive to light. No scleral icterus.  Neck: Normal range of motion. Neck supple.  Cardiovascular: Normal rate, regular rhythm, normal heart sounds and intact distal pulses.   No murmur heard. Pulmonary/Chest: Effort normal and breath sounds normal. No respiratory distress. She has no wheezes. She has no rales.  Musculoskeletal: She exhibits no edema.  See HPI for foot exam if done  Chronic varicose veins  Light compression stockings in place  No open wounds on feet/legs  Lymphadenopathy:    She has no cervical adenopathy.  Skin: Skin is warm and dry. Rash noted.  3 isolated pruritic papules medial dorsal right foot  Psychiatric: She has a normal mood and affect.  Nursing note and vitals reviewed.  Lab Results  Component Value Date   HGBA1C 7.1 (H) 09/27/2015   Lab Results  Component Value Date   CREATININE 1.01 10/13/2015       Assessment & Plan:   Problem List Items Addressed This Visit    (HFpEF) heart failure with preserved ejection fraction ([place])    euvolemic today.       Relevant Medications   metoprolol tartrate (LOPRESSOR) 25 MG tablet   furosemide (LASIX) 20 MG tablet   Chronic venous insufficiency    Stable period. Continue light compression stockings.       Relevant Medications   metoprolol tartrate (LOPRESSOR) 25 MG tablet   furosemide (LASIX) 20 MG tablet   Diabetes mellitus type 2 with peripheral artery disease (HCC)    Stable period      Relevant Medications   metoprolol tartrate (LOPRESSOR) 25 MG tablet   furosemide (LASIX) 20 MG tablet   GAD (generalized anxiety disorder)    Stable period. She has done well with HH. Requests sooner appt with me for f/u.       Hypertension    Hypotensive but asymptomatic today, improved on recheck. Will decrease lasix to 10mg  daily, continue metoprolol 50mg  bid and  ramipril 5mg  capsule.      Relevant Medications   metoprolol tartrate (LOPRESSOR) 25 MG tablet   furosemide (LASIX) 20 MG tablet   Paroxysmal atrial fibrillation ([place])    Sounds regular today. Continue metoprolol. Avoid anticoagulation given h/o varicose bleeds and anemia, PUD, high fall risk.       Relevant Medications   metoprolol tartrate (LOPRESSOR) 25 MG tablet   furosemide (LASIX) 20 MG tablet   Pedal edema    No further pedal edema.       Skin rash    R foot rash from bug bites after recent exposure outdoors. Discussed supportive treatment - use steroid cream she has at home which is helping (pt doesn't know name).  RESOLVED: Venous ulcer of leg (HCC)   Weakness    Pt desires to return to work at [employer]. Discussed return to work 1/2 days at a time.        Other Visit Diagnoses    Need for influenza vaccination    -  Primary   Relevant Orders   Flu Vaccine QUAD 36+ mos PF IM (Fluarix & Fluzone Quad PF) (Completed)       Follow up plan: Return in about 3 months (around 03/15/2016) for follow up visit.  Ria Bush, MD

## 2015-12-15 NOTE — Assessment & Plan Note (Addendum)
Stable period. Continue light compression stockings.

## 2015-12-15 NOTE — Patient Instructions (Addendum)
Flu shot today Continue metoprolol 1 tablet twice daily and ramipril 5mg  daily. Decrease lasix to 10mg  daily (1/2 tablet daily).  You have bug bites on your right foot - not vein related. May continue steroid cream for itching. This should improve over time.  Ok to try half days at work.  You are doing well!

## 2015-12-15 NOTE — Assessment & Plan Note (Signed)
No further pedal edema.

## 2015-12-16 ENCOUNTER — Other Ambulatory Visit: Payer: Self-pay | Admitting: Family Medicine

## 2015-12-30 ENCOUNTER — Encounter: Payer: Self-pay | Admitting: Endocrinology

## 2015-12-30 ENCOUNTER — Ambulatory Visit (INDEPENDENT_AMBULATORY_CARE_PROVIDER_SITE_OTHER): Payer: Medicare Other | Admitting: Endocrinology

## 2015-12-30 ENCOUNTER — Other Ambulatory Visit: Payer: Self-pay | Admitting: Endocrinology

## 2015-12-30 VITALS — BP 138/72 | HR 66 | Temp 97.9°F | Resp 16 | Ht <= 58 in | Wt 145.6 lb

## 2015-12-30 DIAGNOSIS — E1149 Type 2 diabetes mellitus with other diabetic neurological complication: Secondary | ICD-10-CM | POA: Diagnosis not present

## 2015-12-30 DIAGNOSIS — R5383 Other fatigue: Secondary | ICD-10-CM | POA: Diagnosis not present

## 2015-12-30 DIAGNOSIS — E119 Type 2 diabetes mellitus without complications: Secondary | ICD-10-CM

## 2015-12-30 LAB — POCT URINALYSIS DIPSTICK
Bilirubin, UA: NEGATIVE
GLUCOSE UA: NEGATIVE
Ketones, UA: NEGATIVE
Nitrite, UA: NEGATIVE
PH UA: 5
Protein, UA: NEGATIVE
RBC UA: NEGATIVE
SPEC GRAV UA: 1.015
UROBILINOGEN UA: 0.2

## 2015-12-30 LAB — URINALYSIS, ROUTINE W REFLEX MICROSCOPIC
BILIRUBIN URINE: NEGATIVE
Hgb urine dipstick: NEGATIVE
KETONES UR: NEGATIVE
NITRITE: NEGATIVE
SPECIFIC GRAVITY, URINE: 1.01 (ref 1.000–1.030)
Total Protein, Urine: NEGATIVE
Urine Glucose: NEGATIVE
Urobilinogen, UA: 0.2 (ref 0.0–1.0)
pH: 5 (ref 5.0–8.0)

## 2015-12-30 LAB — LIPID PANEL
CHOL/HDL RATIO: 3
Cholesterol: 118 mg/dL (ref 0–200)
HDL: 45.4 mg/dL (ref >39.00)
LDL CALC: 56 mg/dL (ref 0–99)
NonHDL: 72.3
TRIGLYCERIDES: 80 mg/dL (ref 0.0–149.0)
VLDL: 16 mg/dL (ref 0.0–40.0)

## 2015-12-30 LAB — POCT GLYCOSYLATED HEMOGLOBIN (HGB A1C): HEMOGLOBIN A1C: 6.8

## 2015-12-30 LAB — BASIC METABOLIC PANEL
BUN: 22 mg/dL (ref 6–23)
CHLORIDE: 103 meq/L (ref 96–112)
CO2: 32 meq/L (ref 19–32)
CREATININE: 0.9 mg/dL (ref 0.40–1.20)
Calcium: 9.1 mg/dL (ref 8.4–10.5)
GFR: 63.01 mL/min (ref >60.00)
Glucose, Bld: 82 mg/dL (ref 70–99)
POTASSIUM: 4.6 meq/L (ref 3.5–5.1)
SODIUM: 140 meq/L (ref 135–145)

## 2015-12-30 LAB — TSH: TSH: 2.71 u[IU]/mL (ref 0.35–4.50)

## 2015-12-30 NOTE — Patient Instructions (Addendum)
Take every 2 days  Check blood sugars on waking up 2x per week  Also check blood sugars about 2 hours after a meal and do this after different meals by rotation  Recommended blood sugar levels on waking up is 90-130 and about 2 hours after meal is 130-160  Please bring your blood sugar monitor to each visit, thank you

## 2015-12-30 NOTE — Progress Notes (Signed)
[name], female   DOB: 25-Sep-1927, 82 y.o.   MRN: [id number]   Reason for Appointment: Diabetes follow-up   History of Present Illness   Diagnosis: Type 2 DIABETES MELITUS, date of diagnosis: 1991   She has had mild diabetes for several years and has been on Actos only since about 2001 Her A1c has generally been around or under 7% She was given a trial of metformin because of her difficulty with weight loss and also edema but she felt that it made her blood sugars go up and she will not try it again Also she thinks her blood sugars were higher with a trial of Januvia  She is currently taking Actos 30 mg daily since 01/2014  Again she is is only checking her sugar only sporadically and does not bring her monitor She thinks she is checking sugars mostly in the mornings  A1c has generally been near 7% and now 6.8 She is not very mobile and does not exercise Her weight is stable     Oral hypoglycemic drugs: Actos 30 mg daily        Side effects from medications: None Monitors blood glucose: ?  Frequency .    Glucometer: One Touch.           Blood Glucose readings from recall: Range 90-147 am, usually not checking after meals  Hypoglycemia frequency: Never.           Meals: 2  meals per day.  she is usually compliant with diet.  She likes to eat cereal in the morning some days  Physical activity:  minimal           Dietician visit: Most recent: Several years ago             Wt Readings from Last 3 Encounters:  12/30/15 145 lb 9.6 oz (66 kg)  12/15/15 144 lb 4 oz (65.4 kg)  11/16/15 143 lb 8 oz (65.1 kg)   Lab Results  Component Value Date   HGBA1C 6.8 12/30/2015   HGBA1C 7.1 (H) 09/27/2015   HGBA1C 7.0 06/29/2015   Lab Results  Component Value Date   MICROALBUR <0.7 06/29/2015   LDLCALC 56 12/30/2015   CREATININE 0.90 12/30/2015       Medication List       Accurate as of 12/30/15 11:59 PM. Always use your most recent med list.          acetaminophen 500 MG tablet Commonly known as:  TYLENOL Take 500 mg by mouth every 6 (six) hours as needed for mild pain.   acidophilus Caps capsule Take 1 capsule by mouth daily.   ALPRAZolam 1 MG tablet Commonly known as:  XANAX Take 0.5 mg by mouth 4 (four) times daily as needed for anxiety.   aspirin EC 81 MG tablet Take 81 mg by mouth daily.   Dexlansoprazole 30 MG capsule Commonly known as:  DEXILANT Take 1 capsule (30 mg total) by mouth daily.   ferrous sulfate 325 (65 FE) MG tablet Take 1 tablet (325 mg total) by mouth daily with breakfast.   furosemide 20 MG tablet Commonly known as:  LASIX Take 0.5 tablets (10 mg total) by mouth daily.   glucosamine-chondroitin 500-400 MG tablet Take 1 tablet by mouth daily.   HYDROcodone-acetaminophen 5-325 MG tablet Commonly known as:  NORCO/VICODIN Take 0.5-1 tablets by mouth every 6 (six) hours as needed for moderate pain.   loperamide 2 MG capsule Commonly known  as:  IMODIUM Take 2 mg by mouth 4 (four) times daily as needed for diarrhea or loose stools.   metoprolol tartrate 25 MG tablet Commonly known as:  LOPRESSOR Take 25 mg by mouth daily. Patient self reports only takes 1 pill twice daily, not 1 1/2 pills bid.   multivitamin tablet Take 1 tablet by mouth daily. Patient reports takes a new vitamin to replace her centrum as recommended by her eye doctor.   naproxen sodium 220 MG tablet Commonly known as:  ANAPROX Take 220 mg by mouth daily after breakfast.   nitroGLYCERIN 0.4 MG SL tablet Commonly known as:  NITROSTAT Place 0.4 mg under the tongue every 5 (five) minutes as needed for chest pain.   nystatin-triamcinolone ointment Commonly known as:  MYCOLOG APPLY 1 APPLICATION TOPICALLY 2 (TWO) TIMES DAILY. AS NEEDED FOR ITCHING   pioglitazone 30 MG tablet Commonly known as:  ACTOS TAKE 1 TABLET BY MOUTH EVERY DAY   pravastatin 20 MG tablet Commonly known as:  PRAVACHOL Take 20 mg by mouth at  bedtime.   ramipril 5 MG capsule Commonly known as:  ALTACE Take 5 mg by mouth every evening.   traZODone 150 MG tablet Commonly known as:  DESYREL Take 150 mg by mouth at bedtime.   triamcinolone cream 0.1 % Commonly known as:  KENALOG APPLY 1 APPLICATION TOPICALLY 2 (TWO) TIMES DAILY.   vitamin B-12 1000 MCG tablet Commonly known as:  CYANOCOBALAMIN Take 1,000 mcg by mouth daily.   vitamin E 400 UNIT capsule Take 400 Units by mouth daily.       Allergies:  Allergies  Allergen Reactions  . Amoxicillin-Pot Clavulanate Other (See Comments)    Reaction:  Unknown   . Metformin And Related Diarrhea    Past Medical History:  Diagnosis Date  . Acute deep vein thrombosis (DVT) of distal vein of right lower extremity ([place]) 12/2013   Acute thrombus of R proximal gastrocnemius vein. Symptomatic provoked distal DVT  . Anxiety   . Cellulitis of leg, left 01/16/2014  . Chronic back pain 03/2012   lumbar DDD with herniation - s/p L S1 transforaminal ESI (10/2012 Dr. Sharlet Salina)  . Chronic venous insufficiency   . Coronary artery disease    CABG 1998  . Cystocele   . Depression   . Diabetes mellitus    type 2, followed by endo.  . Diastolic dysfunction   . Diverticulosis   . Hemorrhoids   . History of heart attack   . Hx of echocardiogram    a. Echo 10/13: mild LVH, EF 55-60%, Gr 1 diast dysfn, PASP 32  . Hyperlipidemia   . Hypertension   . Irritable bowel syndrome   . Microcytic anemia   . PAD (peripheral artery disease) ([place]) 2013   ABI: R 0.91, L 0.83  . PUD (peptic ulcer disease)   . Varicose veins   . Venous ulcer of leg ([place]) 12/12/2013   Required prolonged treatment by wound center  . Viral gastroenteritis due to [place] virus 05/13/2015   hospitalization    Past Surgical History:  Procedure Laterality Date  . ABDOMINAL HYSTERECTOMY  1975   TAH,BSO  . ABI  2013   R 0.91, L 0.83  . APPENDECTOMY     per pt report  . CARDIOVASCULAR STRESS TEST  2015   low risk  myoview ([place])  . CATARACT SURGERY    . CHOLECYSTECTOMY    . COLONOSCOPY  02/2010   extensive diverticulosis throughout, small internal hemorrhoids, rec  rpt 5 yrs (Dr. Allyn Kenner)  . CORONARY ARTERY BYPASS GRAFT  1998  . dexa scan  09/2011   femur -0.8, forearm 0.6 => normal  . ENDOVENOUS ABLATION SAPHENOUS VEIN W/ LASER Left 03/2014   Kellie Simmering  . ESI  2014   L S1 transforaminal ESI x2 ([place]) without improvement  . HAND SURGERY    . lexiscan myoview  03/2011   Small basal inferolateral and anterolateral reversible perfusion defect suggests ischemia.  EF was normal.   old infarct, no new ischemia  . toe surgery    . TYMPANOPLASTY  1960s   R side  . Vault prolapse A & P repair  2009   Dr Rhodia Albright [facility] hospital    Family History  Problem Relation Age of Onset  . Hypertension Mother   . Stroke Mother   . Diabetes Father   . Diabetes Sister   . Diabetes Brother   . Colon cancer Neg Hx     Social History:  reports that she quit smoking about 36 years ago. She has never used smokeless tobacco. She reports that she does not drink alcohol or use drugs.  Review of Systems:  She is complaining of fatigue again, No prior history of thyroid disease  Lab Results  Component Value Date   TSH 2.71 12/30/2015   TSH 1.82 10/31/2014   TSH 1.16 05/07/2012      HYPERTENSION:  well-controlled  Also takes Lasix for edema Because of low-normal blood pressure she was told to take a half tablet by her PCP but she cannot cut this in half   No microalbuminuria present and has normal renal function and potassium   Lab Results  Component Value Date   CREATININE 0.90 12/30/2015   BUN 22 12/30/2015   NA 140 12/30/2015   K 4.6 12/30/2015   CL 103 12/30/2015   CO2 32 12/30/2015     HYPERLIPIDEMIA: The lipid abnormality consists of elevated LDL treated with pravastatin low-dose, followed by PCP.  Lab Results  Component Value Date   CHOL 118 12/30/2015   HDL 45.40 12/30/2015    LDLCALC 56 12/30/2015   TRIG 80.0 12/30/2015   CHOLHDL 3 12/30/2015    Last foot exam in 9/17 by PCP  She has numerous other questions and complaints about various problems including malodorous urine in the morning     Examination:   BP 138/72   Pulse 66   Temp 97.9 F (36.6 C)   Resp 16   Ht 4\' 10"  (1.473 m)   Wt 145 lb 9.6 oz (66 kg)   SpO2 96%   BMI 30.43 kg/m   Body mass index is 30.43 kg/m.    no lower leg edema present bilaterally   ASSESSMENT/ PLAN:   The patient's diabetes control is appearing fairly good with  A1c 6.8 on Actos alone  Considering her age and duration of diabetes her level of control is fairly good She is not checking her blood sugars much lately at home, reports good readings  Has been on Actos monotherapy for years with stable control; doing fairly well with 30 mg dose without any side effects  She has difficulty losing weight because of her inability to exercise She will continue the same management  Will  see her back in 6 months   Urinary odor: We will check urinalysis  History of edema: Since this is resolved she can try taking Lasix every other day  [facility] 12/31/2015, 8:11 AM

## 2015-12-31 ENCOUNTER — Other Ambulatory Visit: Payer: Self-pay | Admitting: *Deleted

## 2015-12-31 MED ORDER — CIPROFLOXACIN HCL 500 MG PO TABS: 500.0000 mg | ORAL_TABLET | Freq: Two times a day (BID) | ORAL | 0 refills | Status: DC

## 2016-01-04 ENCOUNTER — Telehealth: Payer: Self-pay | Admitting: Endocrinology

## 2016-01-04 NOTE — Telephone Encounter (Signed)
Pt called and said that she is out of the medication that was given to her for the UTI, she wants to know what she needs to do now. Requests call back.

## 2016-01-05 NOTE — Telephone Encounter (Signed)
UTI symptoms are not any better please advise on what the next steps are

## 2016-01-05 NOTE — Telephone Encounter (Signed)
Patient states UTI symptoms are not getting better and done with meds what is the next step for her please advise

## 2016-01-05 NOTE — Telephone Encounter (Signed)
What symptoms is she having ? Probably needs to have urine recheked by PCP

## 2016-01-06 NOTE — Telephone Encounter (Signed)
Spoke with patient and advised her to call her PCP for appointment she stated an understanding

## 2016-01-08 ENCOUNTER — Ambulatory Visit (INDEPENDENT_AMBULATORY_CARE_PROVIDER_SITE_OTHER): Payer: Medicare Other | Admitting: Family Medicine

## 2016-01-08 ENCOUNTER — Encounter: Payer: Self-pay | Admitting: Family Medicine

## 2016-01-08 ENCOUNTER — Telehealth: Payer: Self-pay | Admitting: Family Medicine

## 2016-01-08 VITALS — BP 150/60 | HR 62 | Temp 98.3°F | Ht <= 58 in | Wt 146.8 lb

## 2016-01-08 DIAGNOSIS — N3000 Acute cystitis without hematuria: Secondary | ICD-10-CM

## 2016-01-08 DIAGNOSIS — I2510 Atherosclerotic heart disease of native coronary artery without angina pectoris: Secondary | ICD-10-CM | POA: Diagnosis not present

## 2016-01-08 DIAGNOSIS — R350 Frequency of micturition: Secondary | ICD-10-CM

## 2016-01-08 DIAGNOSIS — I480 Paroxysmal atrial fibrillation: Secondary | ICD-10-CM

## 2016-01-08 DIAGNOSIS — R002 Palpitations: Secondary | ICD-10-CM

## 2016-01-08 DIAGNOSIS — N390 Urinary tract infection, site not specified: Secondary | ICD-10-CM | POA: Insufficient documentation

## 2016-01-08 DIAGNOSIS — I10 Essential (primary) hypertension: Secondary | ICD-10-CM

## 2016-01-08 DIAGNOSIS — I5030 Unspecified diastolic (congestive) heart failure: Secondary | ICD-10-CM

## 2016-01-08 LAB — POC URINALSYSI DIPSTICK (AUTOMATED)
Bilirubin, UA: NEGATIVE
GLUCOSE UA: NEGATIVE
Ketones, UA: NEGATIVE
Leukocytes, UA: NEGATIVE
NITRITE UA: NEGATIVE
PH UA: 6
PROTEIN UA: NEGATIVE
RBC UA: NEGATIVE
Spec Grav, UA: 1.025
UROBILINOGEN UA: 0.2

## 2016-01-08 NOTE — Progress Notes (Signed)
Pre visit review using our clinic review tool, if applicable. No additional management support is needed unless otherwise documented below in the visit note. 

## 2016-01-08 NOTE — Assessment & Plan Note (Signed)
Now resolved, after cipro. UA clear today.   Not sure if true initial infection given pt really with asymptomatic bacteruria.

## 2016-01-08 NOTE — Assessment & Plan Note (Signed)
Pt stable fluid status.   Wt Readings from Last 3 Encounters:  01/08/16 146 lb 12 oz (66.6 kg)  12/30/15 145 lb 9.6 oz (66 kg)  12/15/15 144 lb 4 oz (65.4 kg)

## 2016-01-08 NOTE — Assessment & Plan Note (Signed)
Previously well controlled on current regimen. Follow BPs at home, call if remaining > 140/90.

## 2016-01-08 NOTE — Assessment & Plan Note (Signed)
Recent increase.. strange feeling in chest. Hard for pt to describe.  EKG today:  NSR with occ ectopic beat. No current Afib.  Will have pt return to see cardiologist if symptoms continuing.

## 2016-01-08 NOTE — Progress Notes (Signed)
Subjective:    [name], female    DOB: 03-22-1928, 82 y.o.   MRN: [id number]  HPI    82 year old female  Pt of Dr. Darnell Level with history of  Of DM, PAD, HTN, Pafib ( BBlocker), malnutrition presents with continued UTI symptoms.  She saw Dr.  Jerrye Bushy on 12/30/2015. Checked UA for urinary odor, only headahce. No dysuria, no fever, no frequency, no urgency.  UA with micro showed  large leuks, tntc wbc, many bacteria He dx her with a UTI and started her on cipro 500 mg BID and increase in fluids.   Today she has completed the antibiotics.  In last few days she feels like her heart is beating too fast. No chest pain, some increase in shortness of breath, increase in fatigue.  No fever. No dysuria, always has urinary urgency. No blood in urine.  HFpEF: no increase inf luid on exam.  BP is elevated today. BP Readings from Last 3 Encounters:  01/08/16 (!) 150/60  12/30/15 138/72  12/15/15 (!) 108/58    Hx of UTI in 09/2015. DID not have typical symptoms at that time.Marland Kitchen arrythmia and headache and fatigued.   Cardiologist Dr. Gwynneth Macleod   UA in office is clear.  Review of Systems  Constitutional: Negative for fatigue and fever.  HENT: Negative for ear pain.   Eyes: Negative for pain.  Respiratory: Negative for chest tightness and shortness of breath.   Cardiovascular: Negative for chest pain, palpitations and leg swelling.  Gastrointestinal: Negative for abdominal pain.  Genitourinary: Positive for urgency. Negative for decreased urine volume, difficulty urinating, hematuria, pelvic pain and vaginal pain.       Objective:   Physical Exam  Constitutional: Vital signs are normal. She appears well-developed and well-nourished. She is cooperative.  Non-toxic appearance. She does not appear ill. No distress.  Elderly with walker  HENT:  Head: Normocephalic.  Right Ear: Hearing, tympanic membrane, external ear and ear canal normal. Tympanic membrane is not erythematous, not  retracted and not bulging.  Left Ear: Hearing, tympanic membrane, external ear and ear canal normal. Tympanic membrane is not erythematous, not retracted and not bulging.  Nose: No mucosal edema or rhinorrhea. Right sinus exhibits no maxillary sinus tenderness and no frontal sinus tenderness. Left sinus exhibits no maxillary sinus tenderness and no frontal sinus tenderness.  Mouth/Throat: Uvula is midline, oropharynx is clear and moist and mucous membranes are normal.  Eyes: Conjunctivae, EOM and lids are normal. Pupils are equal, round, and reactive to light. Lids are everted and swept, no foreign bodies found.  Neck: Trachea normal and normal range of motion. Neck supple. Carotid bruit is not present. No thyroid mass and no thyromegaly present.  Cardiovascular: Normal rate, regular rhythm, S1 normal, S2 normal, normal heart sounds, intact distal pulses and normal pulses.  Exam reveals no gallop and no friction rub.   No murmur heard. Pulmonary/Chest: Effort normal and breath sounds normal. No tachypnea. No respiratory distress. She has no decreased breath sounds. She has no wheezes. She has no rhonchi. She has no rales.  Abdominal: Soft. Normal appearance and bowel sounds are normal. There is no tenderness. There is no CVA tenderness.  Musculoskeletal:       Lumbar back: She exhibits decreased range of motion and tenderness.  Chronic low back pain  Neurological: She is alert.  Skin: Skin is warm, dry and intact. No rash noted.  Psychiatric: Her speech is normal and behavior is normal. Judgment and  thought content normal. Her mood appears not anxious. Cognition and memory are normal. She does not exhibit a depressed mood.          Assessment & Plan:

## 2016-01-08 NOTE — Telephone Encounter (Signed)
error 

## 2016-01-08 NOTE — Patient Instructions (Addendum)
No current urinary infection.  EKG today appears normal.  Avoid dehydration.Marland Kitchen Keep up with steady amount of fluids.  Follow up with cardiology if symptoms continuing.  Follow BPs at home, call if remaining > 140/90.

## 2016-01-12 ENCOUNTER — Telehealth: Payer: Self-pay

## 2016-01-12 MED ORDER — HYDROCODONE-ACETAMINOPHEN 5-325 MG PO TABS: 0.5000 | ORAL_TABLET | Freq: Four times a day (QID) | ORAL | 0 refills | Status: DC | PRN

## 2016-01-12 NOTE — Telephone Encounter (Signed)
Printed and in Kim's box 

## 2016-01-12 NOTE — Telephone Encounter (Signed)
Pt left v/m requesting rx hydrocodone apap. Call when ready for pick up. Last printed # 60 on 12/10/15. Last seen 01/08/16.

## 2016-01-13 ENCOUNTER — Other Ambulatory Visit: Payer: Self-pay | Admitting: Family Medicine

## 2016-01-13 NOTE — Telephone Encounter (Signed)
Rx given to [facility] to give to patient.

## 2016-01-13 NOTE — Telephone Encounter (Signed)
Patient called and asked to be notified when rx is ready. Please call patient when rx is ready at [phone].

## 2016-01-14 DIAGNOSIS — H04123 Dry eye syndrome of bilateral lacrimal glands: Secondary | ICD-10-CM | POA: Diagnosis not present

## 2016-02-09 ENCOUNTER — Ambulatory Visit (INDEPENDENT_AMBULATORY_CARE_PROVIDER_SITE_OTHER): Payer: Medicare Other | Admitting: Family Medicine

## 2016-02-09 ENCOUNTER — Encounter: Payer: Self-pay | Admitting: Family Medicine

## 2016-02-09 VITALS — BP 122/60 | HR 76 | Temp 98.3°F | Wt 147.0 lb

## 2016-02-09 DIAGNOSIS — I2510 Atherosclerotic heart disease of native coronary artery without angina pectoris: Secondary | ICD-10-CM | POA: Diagnosis not present

## 2016-02-09 DIAGNOSIS — F411 Generalized anxiety disorder: Secondary | ICD-10-CM

## 2016-02-09 DIAGNOSIS — I872 Venous insufficiency (chronic) (peripheral): Secondary | ICD-10-CM

## 2016-02-09 DIAGNOSIS — M545 Low back pain: Secondary | ICD-10-CM

## 2016-02-09 DIAGNOSIS — H6123 Impacted cerumen, bilateral: Secondary | ICD-10-CM | POA: Diagnosis not present

## 2016-02-09 DIAGNOSIS — G8929 Other chronic pain: Secondary | ICD-10-CM

## 2016-02-09 DIAGNOSIS — Z79891 Long term (current) use of opiate analgesic: Secondary | ICD-10-CM | POA: Diagnosis not present

## 2016-02-09 MED ORDER — HYDROCODONE-ACETAMINOPHEN 5-325 MG PO TABS: 0.5000 | ORAL_TABLET | Freq: Four times a day (QID) | ORAL | 0 refills | Status: DC | PRN

## 2016-02-09 NOTE — Assessment & Plan Note (Signed)
UDS updated today.

## 2016-02-09 NOTE — Progress Notes (Signed)
BP 122/60   Pulse 76   Temp 98.3 F (36.8 C) (Oral)   Wt 147 lb (66.7 kg)   BMI 30.72 kg/m    CC: f/u visit Subjective:    [name], female    DOB: 08-15-1927, 82 y.o.   MRN: [id number]  HPI: [name] is a 82 y.o. female presenting on 02/09/2016 for Follow-up   AMW 11/2015.   Hospitalization 09/2015 this year for UTI, HFpEF exac, afib with RVR. Out of work for 10 wks, was eager to return (at age 65). Completed HH PT/OT. She does use rollater at work.   Ongoing hot flashes associated with anxiety attacks. She sees psychiatrist Lattie Haw, hasn't seen since summer. Continued high stress environment at home and work. Takes xanax 0.5mg  QID along with trazodone 150mg  nightly.  She has been having some intermittent headaches - frontal headaches "just hurts" along with neck pain and recent ST, now better.   [facility] Dr Kellie Simmering did not recommend any further intervention for her chronic painful varicosities. She did have ablation of L greater saphenous vein. ABI showed bilateral moderate arterial insufficiency at rest. Having trouble placing heavy compression stockings on.   Relevant past medical, surgical, family and social history reviewed and updated as indicated. Interim medical history since our last visit reviewed. Allergies and medications reviewed and updated. Current Outpatient Prescriptions on File Prior to Visit  Medication Sig  . acetaminophen (TYLENOL) 500 MG tablet Take 500 mg by mouth every 6 (six) hours as needed for mild pain.  Marland Kitchen acidophilus (RISAQUAD) CAPS capsule Take 1 capsule by mouth daily.  Marland Kitchen ALPRAZolam (XANAX) 1 MG tablet Take 0.5 mg by mouth 4 (four) times daily as needed for anxiety.   Marland Kitchen aspirin EC 81 MG tablet Take 81 mg by mouth daily.  Marland Kitchen Dexlansoprazole (DEXILANT) 30 MG capsule Take 1 capsule (30 mg total) by mouth daily.  . ferrous sulfate 325 (65 FE) MG tablet Take 1 tablet (325 mg total) by mouth daily with breakfast.  . furosemide (LASIX) 20  MG tablet Take 0.5 tablets (10 mg total) by mouth daily.  Marland Kitchen glucosamine-chondroitin 500-400 MG tablet Take 1 tablet by mouth daily.   Marland Kitchen loperamide (IMODIUM) 2 MG capsule Take 2 mg by mouth 4 (four) times daily as needed for diarrhea or loose stools.  . Multiple Vitamin (MULTIVITAMIN) tablet Take 1 tablet by mouth daily. Patient reports takes a new vitamin to replace her centrum as recommended by her eye doctor.  . naproxen sodium (ANAPROX) 220 MG tablet Take 220 mg by mouth daily after breakfast.  . nitroGLYCERIN (NITROSTAT) 0.4 MG SL tablet Place 0.4 mg under the tongue every 5 (five) minutes as needed for chest pain.   Marland Kitchen nystatin-triamcinolone ointment (MYCOLOG) APPLY 1 APPLICATION TOPICALLY 2 (TWO) TIMES DAILY. AS NEEDED FOR ITCHING  . pioglitazone (ACTOS) 30 MG tablet TAKE 1 TABLET BY MOUTH EVERY DAY  . pravastatin (PRAVACHOL) 20 MG tablet TAKE 1 TABLET BY MOUTH DAILY.  . ramipril (ALTACE) 5 MG capsule Take 5 mg by mouth every evening.   . traZODone (DESYREL) 150 MG tablet Take 150 mg by mouth at bedtime.   . triamcinolone cream (KENALOG) 0.1 % APPLY 1 APPLICATION TOPICALLY 2 (TWO) TIMES DAILY.  . vitamin B-12 (CYANOCOBALAMIN) 1000 MCG tablet Take 1,000 mcg by mouth daily.  . vitamin E 400 UNIT capsule Take 400 Units by mouth daily.   No current facility-administered medications on file prior to visit.     Review  of Systems Per HPI unless specifically indicated in ROS section     Objective:    BP 122/60   Pulse 76   Temp 98.3 F (36.8 C) (Oral)   Wt 147 lb (66.7 kg)   BMI 30.72 kg/m   Wt Readings from Last 3 Encounters:  02/09/16 147 lb (66.7 kg)  01/08/16 146 lb 12 oz (66.6 kg)  12/30/15 145 lb 9.6 oz (66 kg)    Physical Exam  Constitutional: She appears well-developed and well-nourished. No distress.  HENT:  Mouth/Throat: Uvula is midline, oropharynx is clear and moist and mucous membranes are normal. No oropharyngeal exudate, posterior oropharyngeal edema, posterior  oropharyngeal erythema or tonsillar abscesses.  TMs covered by cerumen  Eyes: Conjunctivae and EOM are normal. Pupils are equal, round, and reactive to light. No scleral icterus.  Neck: Normal range of motion. Neck supple. No thyromegaly present.  Cardiovascular: Normal rate, regular rhythm, normal heart sounds and intact distal pulses.   No murmur heard. Pulmonary/Chest: Effort normal and breath sounds normal. No respiratory distress. She has no wheezes. She has no rales.  Musculoskeletal: She exhibits no edema.  Chronic varicosities  Lymphadenopathy:    She has no cervical adenopathy.  Skin: Skin is warm and dry. No rash noted.  Psychiatric: She has a normal mood and affect.  Nursing note and vitals reviewed.     Assessment & Plan:   Problem List Items Addressed This Visit    Cerumen impaction    Declines irrigation today. Pt will try home dilute H2O2 and return if desires in office irrigation.      Chronic lower back pain    Hydrocodone refilled.      Relevant Medications   HYDROcodone-acetaminophen (NORCO/VICODIN) 5-325 MG tablet   Chronic venous insufficiency   Relevant Medications   metoprolol tartrate (LOPRESSOR) 25 MG tablet   Encounter for chronic pain management    UDS updated today.       GAD (generalized anxiety disorder) - Primary    Good portion of visit spent discussing current stressors and importance of stress relieving strategies. Handout provided today. If no improvement noted, recommended f/u with psychiatry NP Noemi Chapel.           Follow up plan: Return in about 4 months (around 06/08/2016) for follow up visit.  [name] Bush, MD

## 2016-02-09 NOTE — Assessment & Plan Note (Signed)
Good portion of visit spent discussing current stressors and importance of stress relieving strategies. Handout provided today. If no improvement noted, recommended f/u with psychiatry NP Noemi Chapel.

## 2016-02-09 NOTE — Patient Instructions (Addendum)
Update UDS today Use a few drops of dilute hydrogen peroxide once week to ears. If no better, return to see [place] for ear irrigation.  Schedule diabetes exam with eye doctor if not done this year.  Return to see me in 5 months. Follow up with Lattie Haw psychiatrist sooner if anxiety not improving.  Stress and Stress Management Stress is a normal reaction to life events. It is what you feel when life demands more than you are used to or more than you can handle. Some stress can be useful. For example, the stress reaction can help you catch the last bus of the day, study for a test, or meet a deadline at work. But stress that occurs too often or for too long can cause problems. It can affect your emotional health and interfere with relationships and normal daily activities. Too much stress can weaken your immune system and increase your risk for physical illness. If you already have a medical problem, stress can make it worse. CAUSES  All sorts of life events may cause stress. An event that causes stress for one person may not be stressful for another person. Major life events commonly cause stress. These may be positive or negative. Examples include losing your job, moving into a new home, getting married, having a baby, or losing a loved one. Less obvious life events may also cause stress, especially if they occur day after day or in combination. Examples include working long hours, driving in traffic, caring for children, being in debt, or being in a difficult relationship. SIGNS AND SYMPTOMS Stress may cause emotional symptoms including, the following:  Anxiety. This is feeling worried, afraid, on edge, overwhelmed, or out of control.  Anger. This is feeling irritated or impatient.  Depression. This is feeling sad, down, helpless, or guilty.  Difficulty focusing, remembering, or making decisions. Stress may cause physical symptoms, including the following:   Aches and pains. These may affect your  head, neck, back, stomach, or other areas of your body.  Tight muscles or clenched jaw.  Low energy or trouble sleeping. Stress may cause unhealthy behaviors, including the following:   Eating to feel better (overeating) or skipping meals.  Sleeping too Arnette, too much, or both.  Working too much or putting off tasks (procrastination).  Smoking, drinking alcohol, or using drugs to feel better. DIAGNOSIS  Stress is diagnosed through an assessment by your health care provider. Your health care provider will ask questions about your symptoms and any stressful life events.Your health care provider will also ask about your medical history and may order blood tests or other tests. Certain medical conditions and medicine can cause physical symptoms similar to stress. Mental illness can cause emotional symptoms and unhealthy behaviors similar to stress. Your health care provider may refer you to a mental health professional for further evaluation.  TREATMENT  Stress management is the recommended treatment for stress.The goals of stress management are reducing stressful life events and coping with stress in healthy ways.  Techniques for reducing stressful life events include the following:  Stress identification. Self-monitor for stress and identify what causes stress for you. These skills may help you to avoid some stressful events.  Time management. Set your priorities, keep a calendar of events, and learn to say "no." These tools can help you avoid making too many commitments. Techniques for coping with stress include the following:  Rethinking the problem. Try to think realistically about stressful events rather than ignoring them or overreacting. Try to  find the positives in a stressful situation rather than focusing on the negatives.  Exercise. Physical exercise can release both physical and emotional tension. The key is to find a form of exercise you enjoy and do it  regularly.  Relaxation techniques. These relax the body and mind. Examples include yoga, meditation, tai chi, biofeedback, deep breathing, progressive muscle relaxation, listening to music, being out in nature, journaling, and other hobbies. Again, the key is to find one or more that you enjoy and can do regularly.  Healthy lifestyle. Eat a balanced diet, get plenty of sleep, and do not smoke. Avoid using alcohol or drugs to relax.  Strong support network. Spend time with family, friends, or other people you enjoy being around.Express your feelings and talk things over with someone you trust. Counseling or talktherapy with a mental health professional may be helpful if you are having difficulty managing stress on your own. Medicine is typically not recommended for the treatment of stress.Talk to your health care provider if you think you need medicine for symptoms of stress. HOME CARE INSTRUCTIONS  Keep all follow-up visits as directed by your health care provider.  Take all medicines as directed by your health care provider. SEEK MEDICAL CARE IF:  Your symptoms get worse or you start having new symptoms.  You feel overwhelmed by your problems and can no longer manage them on your own. SEEK IMMEDIATE MEDICAL CARE IF:  You feel like hurting yourself or someone else.   This information is not intended to replace advice given to you by your health care provider. Make sure you discuss any questions you have with your health care provider.   Document Released: 09/14/2000 Document Revised: 04/11/2014 Document Reviewed: 11/13/2012 Elsevier Interactive Patient Education [employer].

## 2016-02-09 NOTE — Assessment & Plan Note (Signed)
Hydrocodone refilled.  

## 2016-02-09 NOTE — Progress Notes (Signed)
Pre visit review using our clinic review tool, if applicable. No additional management support is needed unless otherwise documented below in the visit note. 

## 2016-02-09 NOTE — Assessment & Plan Note (Signed)
Declines irrigation today. Pt will try home dilute H2O2 and return if desires in office irrigation.

## 2016-02-17 ENCOUNTER — Other Ambulatory Visit: Payer: Self-pay | Admitting: Gynecology

## 2016-02-17 DIAGNOSIS — H353131 Nonexudative age-related macular degeneration, bilateral, early dry stage: Secondary | ICD-10-CM | POA: Diagnosis not present

## 2016-02-17 NOTE — Telephone Encounter (Signed)
Uses  Mytrex cream intermittently for irritation

## 2016-03-15 ENCOUNTER — Other Ambulatory Visit: Payer: Self-pay | Admitting: Family Medicine

## 2016-04-06 ENCOUNTER — Other Ambulatory Visit: Payer: Self-pay

## 2016-04-06 NOTE — Telephone Encounter (Signed)
Pt left v/m requesting rx hydrocodone apap. Call when ready for pick up. Pt last seen and rx last printed # 60 on 02/09/16; pt is out of med.Please advise.

## 2016-04-07 MED ORDER — HYDROCODONE-ACETAMINOPHEN 5-325 MG PO TABS: 0.5000 | ORAL_TABLET | Freq: Four times a day (QID) | ORAL | 0 refills | Status: DC | PRN

## 2016-04-07 NOTE — Telephone Encounter (Signed)
Patient notified and Rx placed up front for pick up. 

## 2016-04-07 NOTE — Telephone Encounter (Signed)
Printed and in Kim's box 

## 2016-04-07 NOTE — Telephone Encounter (Signed)
To PCP

## 2016-04-07 NOTE — Telephone Encounter (Signed)
Pt left v/m requesting cb about status of hydrocodone apap.

## 2016-04-27 ENCOUNTER — Encounter: Payer: Self-pay | Admitting: Endocrinology

## 2016-04-27 ENCOUNTER — Ambulatory Visit (INDEPENDENT_AMBULATORY_CARE_PROVIDER_SITE_OTHER): Payer: Medicare Other | Admitting: Endocrinology

## 2016-04-27 VITALS — BP 120/56 | HR 60 | Ht <= 58 in | Wt 146.0 lb

## 2016-04-27 DIAGNOSIS — E1149 Type 2 diabetes mellitus with other diabetic neurological complication: Secondary | ICD-10-CM

## 2016-04-27 LAB — COMPREHENSIVE METABOLIC PANEL
ALBUMIN: 3.8 g/dL (ref 3.5–5.2)
ALK PHOS: 101 U/L (ref 39–117)
ALT: 15 U/L (ref 0–35)
AST: 19 U/L (ref 0–37)
BUN: 33 mg/dL — AB (ref 6–23)
CALCIUM: 9.5 mg/dL (ref 8.4–10.5)
CO2: 32 mEq/L (ref 19–32)
CREATININE: 1.04 mg/dL (ref 0.40–1.20)
Chloride: 102 mEq/L (ref 96–112)
GFR: 53.29 mL/min — ABNORMAL LOW (ref >60.00)
Glucose, Bld: 96 mg/dL (ref 70–99)
Potassium: 4.5 mEq/L (ref 3.5–5.1)
SODIUM: 139 meq/L (ref 135–145)
TOTAL PROTEIN: 6.9 g/dL (ref 6.0–8.3)
Total Bilirubin: 0.4 mg/dL (ref 0.2–1.2)

## 2016-04-27 LAB — URINALYSIS, ROUTINE W REFLEX MICROSCOPIC
Bilirubin Urine: NEGATIVE
HGB URINE DIPSTICK: NEGATIVE
Ketones, ur: NEGATIVE
Nitrite: NEGATIVE
RBC / HPF: NONE SEEN (ref 0–?)
Specific Gravity, Urine: 1.01 (ref 1.000–1.030)
Total Protein, Urine: NEGATIVE
URINE GLUCOSE: NEGATIVE
Urobilinogen, UA: 0.2 (ref 0.0–1.0)
pH: 5 (ref 5.0–8.0)

## 2016-04-27 LAB — MICROALBUMIN / CREATININE URINE RATIO
Creatinine,U: 19.8 mg/dL
Microalb Creat Ratio: 3.5 mg/g (ref 0.0–30.0)

## 2016-04-27 LAB — POCT GLYCOSYLATED HEMOGLOBIN (HGB A1C): HEMOGLOBIN A1C: 6.8

## 2016-04-27 NOTE — Progress Notes (Signed)
[name], female   DOB: 1927/12/07, 83 y.o.   MRN: [id number]   Reason for Appointment: Diabetes follow-up   History of Present Illness   Diagnosis: Type 2 DIABETES MELITUS, date of diagnosis: 1991   She has had mild diabetes for several years and has been on Actos only since about 2001 Her A1c has generally been around or under 7% She was given a trial of metformin because of her difficulty with weight loss and also edema but she felt that it made her blood sugars go up and she will not try it again Also she thinks her blood sugars were higher with a trial of Januvia  She is currently taking Actos 30 mg daily since 01/2014  Again she is is only checking her sugar only sporadically and does not bring her monitor She thinks her blood sugar was over 200 this morning after breakfast but 134 this afternoon, does not remember other readings which she is doing mostly in the afternoon   she is checking sugars mostly in the mornings  A1c has generally been near 7% and again today 6.8 She is somewhat active with doing housework; does not exercise Her weight is stable     Oral hypoglycemic drugs: Actos 30 mg daily        Side effects from medications: None Monitors blood glucose: ?  Frequency .    Glucometer: One Touch.           Blood Glucose readings from recall: As above  Hypoglycemia frequency: Never.           Meals: 2  meals per day.  she is usually compliant with diet.  She likes to eat cereal in the morning some days  Physical activity:  minimal           Dietician visit: Most recent: Several years ago             Wt Readings from Last 3 Encounters:  04/27/16 146 lb (66.2 kg)  02/09/16 147 lb (66.7 kg)  01/08/16 146 lb 12 oz (66.6 kg)   Lab Results  Component Value Date   HGBA1C 6.8 12/30/2015   HGBA1C 7.1 (H) 09/27/2015   HGBA1C 7.0 06/29/2015   Lab Results  Component Value Date   MICROALBUR <0.7 06/29/2015   LDLCALC 56 12/30/2015   CREATININE 0.90 12/30/2015     Allergies as of 04/27/2016      Reactions   Amoxicillin-pot Clavulanate Other (See Comments)   Reaction:  Unknown    Metformin And Related Diarrhea      Medication List       Accurate as of 04/27/16  2:21 PM. Always use your most recent med list.          acetaminophen 500 MG tablet Commonly known as:  TYLENOL Take 500 mg by mouth every 6 (six) hours as needed for mild pain.   acidophilus Caps capsule Take 1 capsule by mouth daily.   ALPRAZolam 1 MG tablet Commonly known as:  XANAX Take 0.5 mg by mouth 4 (four) times daily as needed for anxiety.   aspirin EC 81 MG tablet Take 81 mg by mouth daily.   Dexlansoprazole 30 MG capsule Commonly known as:  DEXILANT Take 1 capsule (30 mg total) by mouth daily.   ferrous sulfate 325 (65 FE) MG tablet Take 1 tablet (325 mg total) by mouth daily with breakfast.   furosemide 20 MG tablet Commonly known as:  LASIX  Take 0.5 tablets (10 mg total) by mouth daily.   glucosamine-chondroitin 500-400 MG tablet Take 1 tablet by mouth daily.   HYDROcodone-acetaminophen 5-325 MG tablet Commonly known as:  NORCO/VICODIN Take 0.5-1 tablets by mouth every 6 (six) hours as needed for moderate pain.   loperamide 2 MG capsule Commonly known as:  IMODIUM Take 2 mg by mouth 4 (four) times daily as needed for diarrhea or loose stools.   metoprolol tartrate 25 MG tablet Commonly known as:  LOPRESSOR Take 25 mg by mouth 2 (two) times daily.   multivitamin tablet Take 1 tablet by mouth daily. Patient reports takes a new vitamin to replace her centrum as recommended by her eye doctor.   naproxen sodium 220 MG tablet Commonly known as:  ANAPROX Take 220 mg by mouth daily after breakfast.   nitroGLYCERIN 0.4 MG SL tablet Commonly known as:  NITROSTAT Place 0.4 mg under the tongue every 5 (five) minutes as needed for chest pain.   nystatin-triamcinolone ointment Commonly known as:  MYCOLOG APPLY 1  APPLICATION TOPICALLY 2 (TWO) TIMES DAILY. AS NEEDED FOR ITCHING   pioglitazone 30 MG tablet Commonly known as:  ACTOS TAKE 1 TABLET BY MOUTH EVERY DAY   pravastatin 20 MG tablet Commonly known as:  PRAVACHOL TAKE 1 TABLET BY MOUTH DAILY.   ramipril 5 MG capsule Commonly known as:  ALTACE Take 5 mg by mouth every evening.   traZODone 150 MG tablet Commonly known as:  DESYREL Take 150 mg by mouth at bedtime.   triamcinolone cream 0.1 % Commonly known as:  KENALOG APPLY 1 APPLICATION TOPICALLY 2 (TWO) TIMES DAILY.   vitamin B-12 1000 MCG tablet Commonly known as:  CYANOCOBALAMIN Take 1,000 mcg by mouth daily.   vitamin E 400 UNIT capsule Take 400 Units by mouth daily.       Allergies:  Allergies  Allergen Reactions  . Amoxicillin-Pot Clavulanate Other (See Comments)    Reaction:  Unknown   . Metformin And Related Diarrhea    Past Medical History:  Diagnosis Date  . Acute deep vein thrombosis (DVT) of distal vein of right lower extremity ([place]) 12/2013   Acute thrombus of R proximal gastrocnemius vein. Symptomatic provoked distal DVT  . Anxiety   . Cellulitis of leg, left 01/16/2014  . Chronic back pain 03/2012   lumbar DDD with herniation - s/p L S1 transforaminal ESI (10/2012 Dr. Sharlet Salina)  . Chronic venous insufficiency   . Coronary artery disease    CABG 1998  . Cystocele   . Depression   . Diabetes mellitus    type 2, followed by endo.  . Diastolic dysfunction   . Diverticulosis   . Hemorrhoids   . History of heart attack   . Hx of echocardiogram    a. Echo 10/13: mild LVH, EF 55-60%, Gr 1 diast dysfn, PASP 32  . Hyperlipidemia   . Hypertension   . Irritable bowel syndrome   . Microcytic anemia   . PAD (peripheral artery disease) ([place]) 2013   ABI: R 0.91, L 0.83  . PUD (peptic ulcer disease)   . Varicose veins   . Venous ulcer of leg ([place]) 12/12/2013   Required prolonged treatment by wound center  . Viral gastroenteritis due to [place] virus  05/13/2015   hospitalization    Past Surgical History:  Procedure Laterality Date  . ABDOMINAL HYSTERECTOMY  1975   TAH,BSO  . ABI  2013   R 0.91, L 0.83  . APPENDECTOMY  per pt report  . CARDIOVASCULAR STRESS TEST  2015   low risk myoview ([place])  . CATARACT SURGERY    . CHOLECYSTECTOMY    . COLONOSCOPY  02/2010   extensive diverticulosis throughout, small internal hemorrhoids, rec rpt 5 yrs (Dr. Allyn Kenner)  . CORONARY ARTERY BYPASS GRAFT  1998  . dexa scan  09/2011   femur -0.8, forearm 0.6 => normal  . ENDOVENOUS ABLATION SAPHENOUS VEIN W/ LASER Left 03/2014   Kellie Simmering  . ESI  2014   L S1 transforaminal ESI x2 ([place]) without improvement  . HAND SURGERY    . lexiscan myoview  03/2011   Small basal inferolateral and anterolateral reversible perfusion defect suggests ischemia.  EF was normal.   old infarct, no new ischemia  . toe surgery    . TYMPANOPLASTY  1960s   R side  . Vault prolapse A & P repair  2009   Dr Rhodia Albright [facility] hospital    Family History  Problem Relation Age of Onset  . Hypertension Mother   . Stroke Mother   . Diabetes Father   . Diabetes Sister   . Diabetes Brother   . Colon cancer Neg Hx     Social History:  reports that she quit smoking about 37 years ago. She has never used smokeless tobacco. She reports that she does not drink alcohol or use drugs.  Review of Systems:  She is Consistently complaining of fatigue and low stamina but is still able to do a lot of housework  Lab Results  Component Value Date   TSH 2.71 12/30/2015   TSH 1.82 10/31/2014   TSH 1.16 05/07/2012     HYPERTENSION:  well-controlled on Ramipril  Also takes Lasix for edema daily   No microalbuminuria present and has normal renal function and potassium   Lab Results  Component Value Date   CREATININE 0.90 12/30/2015   BUN 22 12/30/2015   NA 140 12/30/2015   K 4.6 12/30/2015   CL 103 12/30/2015   CO2 32 12/30/2015     HYPERLIPIDEMIA: The lipid  abnormality consists of elevated LDL treated with pravastatin low-dose, followed by PCP.  Lab Results  Component Value Date   CHOL 118 12/30/2015   HDL 45.40 12/30/2015   LDLCALC 56 12/30/2015   TRIG 80.0 12/30/2015   CHOLHDL 3 12/30/2015    Last foot exam in 9/17 by PCP      Examination:   BP (!) 120/56   Pulse 60   Ht 4\' 10"  (1.473 m)   Wt 146 lb (66.2 kg)   SpO2 96%   BMI 30.51 kg/m   Body mass index is 30.51 kg/m.    no lower leg edema present   ASSESSMENT/ PLAN:   The patient's diabetes control is appearing fairly good with  A1c Again 6.8 on Actos alone  Considering her age and duration of diabetes her level of control is fairly good  She reports occasionally high readingsIn the mornings after her high carbohydrate breakfast but otherwise checking sporadically in the afternoon without consistently high readings, however is checking very inconsistently Discussed trying to get a balanced meal in the morning and less carbohydrate New prescription sent for testing She will call if she has any consistently high readings  To continue Actos 30 mg once daily Call if having any increased swelling  Will  see her back in 6 months   History of edema: She wants to continue taking Lasix daily but will need to make sure  her renal function is normal  Patient Instructions  Check blood sugars on waking up  2x weekly  Also check blood sugars about 2 hours after a meal and do this after different meals by rotation  Recommended blood sugar levels on waking up is 90-130 and about 2 hours after meal is 130-160  Please bring your blood sugar monitor to each visit, thank you  No fruit in am and euse 1/2 bowl in am   [facility] 04/27/2016, 2:21 PM      Note: This office note was prepared with Estate agent. Any transcriptional errors that result from this process are unintentional.

## 2016-04-27 NOTE — Patient Instructions (Signed)
Check blood sugars on waking up  2x weekly  Also check blood sugars about 2 hours after a meal and do this after different meals by rotation  Recommended blood sugar levels on waking up is 90-130 and about 2 hours after meal is 130-160  Please bring your blood sugar monitor to each visit, thank you  No fruit in am and euse 1/2 bowl in am

## 2016-04-30 ENCOUNTER — Other Ambulatory Visit: Payer: Self-pay | Admitting: Endocrinology

## 2016-05-02 ENCOUNTER — Telehealth: Payer: Self-pay | Admitting: Endocrinology

## 2016-05-02 MED ORDER — GLUCOSE BLOOD VI STRP: ORAL_STRIP | 5 refills | Status: DC

## 2016-05-02 NOTE — Telephone Encounter (Signed)
Refill submitted. 

## 2016-05-02 NOTE — Telephone Encounter (Signed)
[facility] called and said they need a prescription for the test strips resubmitted with the Dx code.

## 2016-05-11 ENCOUNTER — Other Ambulatory Visit: Payer: Self-pay | Admitting: Family Medicine

## 2016-05-11 NOTE — Telephone Encounter (Signed)
Pt left v/m requesting rx hydrocodone apap. Call when ready for pick up. Last printed # 60 on 04/07/16. Last seen 04/27/16. Pt is almost out of med. Pt request cb when done.

## 2016-05-11 NOTE — Telephone Encounter (Signed)
Last Ov: 02/09/2016 NOV: 07/11/2016 LRF: 05/12/2015 Please advise

## 2016-05-12 MED ORDER — HYDROCODONE-ACETAMINOPHEN 5-325 MG PO TABS: 0.5000 | ORAL_TABLET | Freq: Four times a day (QID) | ORAL | 0 refills | Status: DC | PRN

## 2016-05-12 NOTE — Telephone Encounter (Signed)
Patient notified and Rx placed up front for pick up. 

## 2016-05-12 NOTE — Telephone Encounter (Signed)
Printed and in Kim's box 

## 2016-05-18 ENCOUNTER — Ambulatory Visit (INDEPENDENT_AMBULATORY_CARE_PROVIDER_SITE_OTHER): Payer: Medicare Other | Admitting: Gynecology

## 2016-05-18 ENCOUNTER — Encounter: Payer: Self-pay | Admitting: Gynecology

## 2016-05-18 VITALS — BP 122/78 | Ht <= 58 in | Wt 148.0 lb

## 2016-05-18 DIAGNOSIS — N8111 Cystocele, midline: Secondary | ICD-10-CM

## 2016-05-18 DIAGNOSIS — N952 Postmenopausal atrophic vaginitis: Secondary | ICD-10-CM

## 2016-05-18 DIAGNOSIS — Z01411 Encounter for gynecological examination (general) (routine) with abnormal findings: Secondary | ICD-10-CM

## 2016-05-18 NOTE — Progress Notes (Signed)
    [place] 1928-02-28 [id number]        83 y.o.  [id number] for breast and pelvic exam.  Past medical history,surgical history, problem list, medications, allergies, family history and social history were all reviewed and documented as reviewed in the EPIC chart.  ROS:  Performed with pertinent positives and negatives included in the history, assessment and plan.   Additional significant findings :  None   Exam: Caryn Bee assistant Vitals:   05/18/16 1406  BP: 122/78  Weight: 148 lb (67.1 kg)  Height: 4\' 10"  (1.473 m)   Body mass index is 30.93 kg/m.  General appearance:  Normal affect, orientation and appearance. Skin: Grossly normal HEENT: Without gross lesions.  No cervical or supraclavicular adenopathy. Thyroid normal.  Lungs:  Clear without wheezing, rales or rhonchi Cardiac: RR, without RMG Abdominal:  Soft, nontender, without masses, guarding, rebound, organomegaly or hernia Breasts:  Examined lying and sitting without masses, retractions, discharge or axillary adenopathy. Pelvic:  Ext, BUS, Vagina with atrophic changes. Second-degree cystocele noted. Cuff well supported.  Adnexa without masses or tenderness    Anus and perineum normal   Rectovaginal normal sphincter tone without palpated masses or tenderness.    Assessment/Plan:  83 y.o. [id number] female for breast and pelvic exam, status post TAH/BSO in the past.   1. Cystocele. Status post vaginal vault prolapse repair with A&P repair by Dr. Rhodia Albright 2009. Second-degree cystocele now overall stable over serial exams. Patient asymptomatic from this. We'll continue to monitor for now. 2. DEXA 2013 normal. Recommended patient schedule DEXA when she schedules her mammogram where she had her last study done. She agrees to arrange. 3. Mammography overdue. I again stressed the need to schedule a screening mammography. Names and numbers provided. Patient agrees to call and schedule. 4. Colonoscopy 2011. Repeat at their  recommended interval. 5. Pap smear 2012. No Pap smear done today. Based on current screening guidelines and no history of abnormal Pap smears previously we both agree to stop screening based on age. 6. Health maintenance. No routine lab work done as patient reports this done elsewhere. Follow up 1 year, sooner as needed.   Anastasio Auerbach MD, 4:18 PM 05/18/2016

## 2016-05-18 NOTE — Patient Instructions (Addendum)
[facility] at [facility] Schedule your Bone density at the [facility]. [address] [place] Phone: [phone]

## 2016-05-20 ENCOUNTER — Telehealth: Payer: Self-pay | Admitting: *Deleted

## 2016-05-20 MED ORDER — NYSTATIN-TRIAMCINOLONE 100000-0.1 UNIT/GM-% EX OINT: TOPICAL_OINTMENT | CUTANEOUS | 1 refills | Status: DC

## 2016-05-20 NOTE — Telephone Encounter (Signed)
Pt had annual on 05/18/16 asked if refill on mycology cream could be sent to pharmacy, for external vaginal irritation. Please advise

## 2016-05-20 NOTE — Telephone Encounter (Signed)
Rx sent, pt aware 

## 2016-05-20 NOTE — Telephone Encounter (Signed)
Okay dispense one tube apply as needed for irritation. Refill 1

## 2016-05-25 ENCOUNTER — Other Ambulatory Visit: Payer: Self-pay | Admitting: Family Medicine

## 2016-05-25 DIAGNOSIS — Z1231 Encounter for screening mammogram for malignant neoplasm of breast: Secondary | ICD-10-CM

## 2016-06-15 DIAGNOSIS — F411 Generalized anxiety disorder: Secondary | ICD-10-CM | POA: Diagnosis not present

## 2016-06-15 DIAGNOSIS — F429 Obsessive-compulsive disorder, unspecified: Secondary | ICD-10-CM | POA: Diagnosis not present

## 2016-06-20 ENCOUNTER — Ambulatory Visit (INDEPENDENT_AMBULATORY_CARE_PROVIDER_SITE_OTHER): Payer: Medicare Other | Admitting: Podiatry

## 2016-06-20 ENCOUNTER — Encounter: Payer: Self-pay | Admitting: Podiatry

## 2016-06-20 VITALS — BP 106/61 | HR 81 | Resp 16

## 2016-06-20 DIAGNOSIS — M216X9 Other acquired deformities of unspecified foot: Secondary | ICD-10-CM

## 2016-06-20 DIAGNOSIS — M2042 Other hammer toe(s) (acquired), left foot: Secondary | ICD-10-CM | POA: Diagnosis not present

## 2016-06-20 DIAGNOSIS — M2041 Other hammer toe(s) (acquired), right foot: Secondary | ICD-10-CM | POA: Diagnosis not present

## 2016-06-20 DIAGNOSIS — M2010 Hallux valgus (acquired), unspecified foot: Secondary | ICD-10-CM | POA: Diagnosis not present

## 2016-06-20 DIAGNOSIS — L84 Corns and callosities: Secondary | ICD-10-CM | POA: Diagnosis not present

## 2016-06-20 NOTE — Progress Notes (Signed)
   Subjective:    [name], female    DOB: [birth year], 83 y.o.   MRN: [id number]  HPI 83 year old female presents the office with concerns of painful calluses the ball of her foot which is been ongoing for several years. She states that she tries to trim the calluses herself however when she does she usually cuts her self. She denies any redness or swelling today. She denies any recent injury or trauma. She denies any claudication symptoms. She has no other complaints today.   Review of Systems  Constitutional: Positive for fatigue.  Gastrointestinal:       Bladder problems,irritable bowel syndrome  Musculoskeletal:       Muscle pain,swelling of the feet, legs  Hematological:       Slow to heal  All other systems reviewed and are negative.      Objective:   Physical Exam General: AAO x3, NAD  Dermatological: hyperkeratotic lesions bilateral submetatarsal. Upon debridement there was no underlying ulceration, drainage or any signs of infection. There is no other open lesions or pre-ulcer lesions identified today.  Vascular: Dorsalis Pedis artery and Posterior Tibial artery pedal pulses are 1/4 bilateral with immedate capillary fill time.  There is no pain with calf compression, swelling, warmth, erythema.   Neruologic: Grossly intact via light touch bilateral. Vibratory intact via tuning fork bilateral. Protective threshold with Semmes Wienstein monofilament intact to all pedal sites bilateral.   Musculoskeletal: HAV and hammertoes are present bilaterally. There is prominence the metatarsal heads plantarly with atrophy of the fat pad. Muscular strength 5/5 in all groups tested bilateral.  Gait: Unassisted, Nonantalgic.     Assessment & Plan:  83 year old female bilateral hyperkeratotic lesions due to digital deformities. -Treatment options discussed including all alternatives, risks, and complications -Etiology of symptoms were discussed -Hyperkeratotic lesions  are sharply debrided 2 without complications or bleeding. -Offloading pads dispensed today. -I discussed shoe gear modifications and possible inserts. -RTC in 3 months or call any questions or concerns the meantime.  Celesta Gentile, DPM

## 2016-06-21 ENCOUNTER — Telehealth: Payer: Self-pay

## 2016-06-21 NOTE — Telephone Encounter (Signed)
Pt said that Dr Dwyane Dee had increased her furosemide 20 mg to one tab daily. The instructions still have 1/2 daily. [name] at [employer] said pt told her that she could not cut the pill in half and was taking one pill daily for that reason.pt has one more refill and [name] said pt could get that # 45 rx for $12.00 by paying out of pocket.  Pt is going to get the # 48 and has appt to see Dr Darnell Level 07/11/16 and will talk with him about changing instructions to one daily. FYI to Dr Darnell Level.

## 2016-06-21 NOTE — Telephone Encounter (Signed)
Noted  

## 2016-06-22 ENCOUNTER — Ambulatory Visit
Admission: RE | Admit: 2016-06-22 | Discharge: 2016-06-22 | Disposition: A | Discharge status: Home / Self Care | Payer: Medicare Other | Source: Ambulatory Visit | Attending: Family Medicine | Admitting: Family Medicine

## 2016-06-22 DIAGNOSIS — Z1231 Encounter for screening mammogram for malignant neoplasm of breast: Secondary | ICD-10-CM

## 2016-06-22 LAB — HM MAMMOGRAPHY

## 2016-06-23 ENCOUNTER — Encounter: Payer: Self-pay | Admitting: *Deleted

## 2016-06-30 ENCOUNTER — Other Ambulatory Visit: Payer: Self-pay | Admitting: *Deleted

## 2016-06-30 MED ORDER — HYDROCODONE-ACETAMINOPHEN 5-325 MG PO TABS: 0.5000 | ORAL_TABLET | Freq: Four times a day (QID) | ORAL | 0 refills | Status: DC | PRN

## 2016-06-30 NOTE — Telephone Encounter (Signed)
Last Rx 05/12/16. Last ov 02/2016. pls advise

## 2016-06-30 NOTE — Telephone Encounter (Signed)
Printed and in [place]' box.

## 2016-06-30 NOTE — Telephone Encounter (Signed)
Patient notified and Rx placed up front for pick up. 

## 2016-07-11 ENCOUNTER — Ambulatory Visit (INDEPENDENT_AMBULATORY_CARE_PROVIDER_SITE_OTHER): Payer: Medicare Other | Admitting: Family Medicine

## 2016-07-11 ENCOUNTER — Encounter: Payer: Self-pay | Admitting: Family Medicine

## 2016-07-11 VITALS — BP 126/72 | HR 80 | Temp 98.4°F | Wt 151.5 lb

## 2016-07-11 DIAGNOSIS — I5030 Unspecified diastolic (congestive) heart failure: Secondary | ICD-10-CM

## 2016-07-11 DIAGNOSIS — E1142 Type 2 diabetes mellitus with diabetic polyneuropathy: Secondary | ICD-10-CM

## 2016-07-11 DIAGNOSIS — G8929 Other chronic pain: Secondary | ICD-10-CM

## 2016-07-11 DIAGNOSIS — I872 Venous insufficiency (chronic) (peripheral): Secondary | ICD-10-CM

## 2016-07-11 DIAGNOSIS — I10 Essential (primary) hypertension: Secondary | ICD-10-CM

## 2016-07-11 DIAGNOSIS — L97219 Non-pressure chronic ulcer of right calf with unspecified severity: Secondary | ICD-10-CM

## 2016-07-11 DIAGNOSIS — L97229 Non-pressure chronic ulcer of left calf with unspecified severity: Secondary | ICD-10-CM

## 2016-07-11 DIAGNOSIS — I480 Paroxysmal atrial fibrillation: Secondary | ICD-10-CM

## 2016-07-11 DIAGNOSIS — E1149 Type 2 diabetes mellitus with other diabetic neurological complication: Secondary | ICD-10-CM | POA: Diagnosis not present

## 2016-07-11 DIAGNOSIS — E785 Hyperlipidemia, unspecified: Secondary | ICD-10-CM

## 2016-07-11 DIAGNOSIS — I83212 Varicose veins of right lower extremity with both ulcer of calf and inflammation: Secondary | ICD-10-CM | POA: Diagnosis not present

## 2016-07-11 DIAGNOSIS — F411 Generalized anxiety disorder: Secondary | ICD-10-CM

## 2016-07-11 DIAGNOSIS — M545 Low back pain: Secondary | ICD-10-CM

## 2016-07-11 DIAGNOSIS — I83222 Varicose veins of left lower extremity with both ulcer of calf and inflammation: Secondary | ICD-10-CM

## 2016-07-11 DIAGNOSIS — E1151 Type 2 diabetes mellitus with diabetic peripheral angiopathy without gangrene: Secondary | ICD-10-CM | POA: Diagnosis not present

## 2016-07-11 MED ORDER — HYDROCODONE-ACETAMINOPHEN 5-325 MG PO TABS
0.5000 | ORAL_TABLET | Freq: Four times a day (QID) | ORAL | 0 refills | Status: DC | PRN
Start: 2016-07-11 — End: 2016-09-13

## 2016-07-11 NOTE — Assessment & Plan Note (Signed)
[place] CSRS reviewed, appropriate. Hydrocodone Rx provided today to fill 07/31/2016. Discussed she must not lose Rx as she will not receive early refill.

## 2016-07-11 NOTE — Assessment & Plan Note (Signed)
Seems euvolemic today. On lasix 10mg  daily.

## 2016-07-11 NOTE — Assessment & Plan Note (Signed)
Anticipate OA related - on hydrocodone and tylenol for this.

## 2016-07-11 NOTE — Assessment & Plan Note (Signed)
Chronic, stable. Continue current regimen. 

## 2016-07-11 NOTE — Patient Instructions (Addendum)
You are doing well today.  Legs are looking good.  Continue current medicines including hydrocodone - prescription refilled today to fill 07/31/2016. Return in 6 months for medicare wellness visit with Katha Cabal then follow up visit with me.

## 2016-07-11 NOTE — Progress Notes (Signed)
Pre visit review using our clinic review tool, if applicable. No additional management support is needed unless otherwise documented below in the visit note. 

## 2016-07-11 NOTE — Assessment & Plan Note (Signed)
Appreciate endo care.  

## 2016-07-11 NOTE — Progress Notes (Signed)
BP 126/72   Pulse 80   Temp 98.4 F (36.9 C) (Oral)   Wt 151 lb 8 oz (68.7 kg)   BMI 31.66 kg/m    CC: 77mo f/u visit Subjective:    [name], female    DOB: 1927/07/02, 83 y.o.   MRN: [id number]  HPI: [name] is a 83 y.o. female presenting on 07/11/2016 for Follow-up   Medicare wellness visit 11/2015. She continues working at [employer] at age 72yo.   Not seen in 5 months. "not feeling well". Haves trouble "getting things done". Ongoing back ache, leg aches. This is despite hydrocodone 1/2 tab QID. She does take a nap in the late afternoon.   Sees Dr Jacqualyn Posey for corns and calluses of toes.   DM - followed by Dr Dwyane Dee.  Anxiety - saw Abran Richard NP psychiatrist.   Chronic LE varicosities with inflammation and h/o bleeding ulcers previously seen by [facility] - no further intervention recommended for painful varicosities. Has done well this winter. s/p L greater saphenous vein ablation. ABI showed bilateral moderate arterial insufficiency.   She continues lasix 10mg  daily  She does use bedside commode.   Relevant past medical, surgical, family and social history reviewed and updated as indicated. Interim medical history since our last visit reviewed. Allergies and medications reviewed and updated. Outpatient Medications Prior to Visit  Medication Sig Dispense Refill  . acetaminophen (TYLENOL) 500 MG tablet Take 500 mg by mouth every 6 (six) hours as needed for mild pain.    Marland Kitchen acidophilus (RISAQUAD) CAPS capsule Take 1 capsule by mouth daily.    Marland Kitchen ALPRAZolam (XANAX) 1 MG tablet Take 0.5 mg by mouth 4 (four) times daily as needed for anxiety.     Marland Kitchen aspirin EC 81 MG tablet Take 81 mg by mouth daily.    Marland Kitchen DEXILANT 30 MG capsule TAKE 1 CAPSULE (30 MG TOTAL) BY MOUTH DAILY. 30 capsule 11  . ferrous sulfate 325 (65 FE) MG tablet Take 1 tablet (325 mg total) by mouth daily with breakfast.    . furosemide (LASIX) 20 MG tablet Take 0.5 tablets (10 mg total) by mouth  daily. 45 tablet 3  . glucosamine-chondroitin 500-400 MG tablet Take 1 tablet by mouth daily.     Marland Kitchen glucose blood (ONE TOUCH ULTRA TEST) test strip USE AS INSTRUCTED TO CHECK BLOOD SUGAR ONCE A DAY Dx code: E11.9 50 each 5  . loperamide (IMODIUM) 2 MG capsule Take 2 mg by mouth 4 (four) times daily as needed for diarrhea or loose stools.    . metoprolol tartrate (LOPRESSOR) 25 MG tablet Take 25 mg by mouth 2 (two) times daily.    . Multiple Vitamin (MULTIVITAMIN) tablet Take 1 tablet by mouth daily. Patient reports takes a new vitamin to replace her centrum as recommended by her eye doctor.    . naproxen sodium (ANAPROX) 220 MG tablet Take 220 mg by mouth daily after breakfast.    . nitroGLYCERIN (NITROSTAT) 0.4 MG SL tablet Place 0.4 mg under the tongue every 5 (five) minutes as needed for chest pain.     Marland Kitchen nystatin-triamcinolone ointment (MYCOLOG) APPLY 1 APPLICATION TOPICALLY 2 (TWO) TIMES DAILY. AS NEEDED FOR ITCHING 60 g 1  . pioglitazone (ACTOS) 30 MG tablet TAKE 1 TABLET BY MOUTH EVERY DAY 30 tablet 11  . pravastatin (PRAVACHOL) 20 MG tablet TAKE 1 TABLET BY MOUTH DAILY. 30 tablet 6  . ramipril (ALTACE) 5 MG capsule Take 5 mg by  mouth every evening.     . traZODone (DESYREL) 150 MG tablet Take 150 mg by mouth at bedtime.     . triamcinolone cream (KENALOG) 0.1 % APPLY 1 APPLICATION TOPICALLY 2 (TWO) TIMES DAILY.  1  . vitamin B-12 (CYANOCOBALAMIN) 1000 MCG tablet Take 1,000 mcg by mouth daily.    . vitamin E 400 UNIT capsule Take 400 Units by mouth daily.    Marland Kitchen HYDROcodone-acetaminophen (NORCO/VICODIN) 5-325 MG tablet Take 0.5-1 tablets by mouth every 6 (six) hours as needed for moderate pain. 60 tablet 0   No facility-administered medications prior to visit.      Per HPI unless specifically indicated in ROS section below Review of Systems     Objective:    BP 126/72   Pulse 80   Temp 98.4 F (36.9 C) (Oral)   Wt 151 lb 8 oz (68.7 kg)   BMI 31.66 kg/m   Wt Readings from Last 3  Encounters:  07/11/16 151 lb 8 oz (68.7 kg)  05/18/16 148 lb (67.1 kg)  04/27/16 146 lb (66.2 kg)    Physical Exam  Constitutional: She appears well-developed and well-nourished. No distress.  HENT:  Mouth/Throat: Oropharynx is clear and moist. No oropharyngeal exudate.  Eyes: Conjunctivae are normal. Pupils are equal, round, and reactive to light.  Cardiovascular: Normal rate, regular rhythm, normal heart sounds and intact distal pulses.   No murmur heard. Pulmonary/Chest: Effort normal and breath sounds normal. No respiratory distress. She has no wheezes. She has no rales.  Musculoskeletal: She exhibits no edema.  No pedal edema. Marked varicosities present BLE R>L One more prominent varicose vein R medial leg  Skin: Skin is warm and dry. No rash noted.  Psychiatric: She has a normal mood and affect. Her behavior is normal.  Nursing note and vitals reviewed.  Results for orders placed or performed in visit on 06/23/16  [facility] MAMMOGRAPHY  Result Value Ref Range   HM Mammogram 0-4 Bi-Rad 0-4 Bi-Rad, Self Reported Normal   Lab Results  Component Value Date   CHOL 118 12/30/2015   HDL 45.40 12/30/2015   LDLCALC 56 12/30/2015   TRIG 80.0 12/30/2015   CHOLHDL 3 12/30/2015       Assessment & Plan:   Problem List Items Addressed This Visit    (HFpEF) heart failure with preserved ejection fraction ([place])    Seems euvolemic today. On lasix 10mg  daily.       Chronic lower back pain    Anticipate OA related - on hydrocodone and tylenol for this.       Relevant Medications   HYDROcodone-acetaminophen (NORCO/VICODIN) 5-325 MG tablet   Chronic venous insufficiency   Diabetes mellitus type 2 with peripheral artery disease (HCC)   Diabetic polyneuropathy ([place])   Encounter for chronic pain management    [place] CSRS reviewed, appropriate. Hydrocodone Rx provided today to fill 07/31/2016. Discussed she must not lose Rx as she will not receive early refill.       GAD (generalized anxiety  disorder) - Primary    Predominant issue - reviewed current stressors, support provided. Has f/u with [facility] Noemi Chapel NP.       Hyperlipidemia    Chronic, stable. Continue current regimen.       Hypertension    Chronic, stable. Continue current regimen.       Paroxysmal atrial fibrillation ([place])    Sounds regular today. On metoprolol and aspirin.       Type 2 diabetes mellitus with neurological  manifestations, controlled ([place])    Appreciate endo care       Varicose veins of left lower extremity with both ulcer of calf and inflammation (HCC)    Stable period without bleed.      Varicose veins of right lower extremity with both ulcer of calf and inflammation (HCC)    Stable period without bleed.          Follow up plan: Return in about 6 months (around 01/10/2017) for follow up visit.  Ria Bush, MD

## 2016-07-11 NOTE — Assessment & Plan Note (Signed)
Predominant issue - reviewed current stressors, support provided. Has f/u with [facility] [name] Chapel NP.

## 2016-07-11 NOTE — Assessment & Plan Note (Signed)
Sounds regular today. On metoprolol and aspirin.

## 2016-07-11 NOTE — Assessment & Plan Note (Signed)
Stable period without bleed.

## 2016-07-18 DIAGNOSIS — H353131 Nonexudative age-related macular degeneration, bilateral, early dry stage: Secondary | ICD-10-CM | POA: Diagnosis not present

## 2016-08-03 ENCOUNTER — Ambulatory Visit (INDEPENDENT_AMBULATORY_CARE_PROVIDER_SITE_OTHER): Payer: Medicare Other | Admitting: Cardiovascular Disease

## 2016-08-03 ENCOUNTER — Encounter: Payer: Self-pay | Admitting: Cardiovascular Disease

## 2016-08-03 VITALS — BP 140/70 | HR 76 | Ht <= 58 in | Wt 151.8 lb

## 2016-08-03 DIAGNOSIS — I480 Paroxysmal atrial fibrillation: Secondary | ICD-10-CM

## 2016-08-03 DIAGNOSIS — I2510 Atherosclerotic heart disease of native coronary artery without angina pectoris: Secondary | ICD-10-CM | POA: Diagnosis not present

## 2016-08-03 DIAGNOSIS — I2583 Coronary atherosclerosis due to lipid rich plaque: Secondary | ICD-10-CM | POA: Diagnosis not present

## 2016-08-03 NOTE — Patient Instructions (Signed)

## 2016-08-03 NOTE — Progress Notes (Signed)
[place] Date of Birth  March 07, 1928 [facility] [address]    [place], [place]  [address] [phone]  Fax  [phone]   Problem list 1. Coronary artery disease-status post CABG in 1996 2. Diabetes mellitus 3. Hypertension 4. Hyperlipidemia 5. Right  DVT.   History of Present Illness:  Dillan is an 83 yo female with a Hx of CAD, CABG ( November 09, 1994), diabetes mellitus, anxiety, HTN, hyperlipidemia.   She's been under a lot of stress recently. Her son died last year. She's under a lot of stress with the extra expenses involved such as his funeral. She presented with some CP in December.  A stress myoview revealed lateral ischemia.  We offered cardiac cath but she did not want to have the cath. She presents today without any particular complaints. She has not had any episodes of chest pain or shortness of breath since I saw her a month ago. She does have occasional episodes of tachycardia. She also notes that she has some anxiety. She was last seen in January after having an abnormal stress test that revealed anterior apical ischemia. She refused cardiac catheterization at that time. She has not had any recent episodes of chest pain.  She does not want to have a cardiac catheterization since she's feeling so well.  She has had some chest burning that is nearly constant.  I suggest several times that she should consider getting the cath done and she changed to subject.  September 04, 2012:  Soley is doing well from a cardiac standpoint.  She is still working - despite having lots of back pain.  No CP .  She had some CP several years ago and had a mildly abnormal myoview.  She did not want to have a cardiac cath at that time and has not had any significant epidoses of CP since that time.  She continues to be under lots of stress  She complains of back pain, stomach pain, burning in her legs.    Nov. 24, 2014:  Pt is doing OK from a cardiac standpoint.   Lots of stress - she has been running the [employer] by herself for the past several months.  She is not getting enough rest.  Sleeping 4 1/2 hours at night - tries to get a nap for an hour.    September 09, 2013:  [place] is doing ok.  She is under lots of stress as usual.    Nov. 10, 2015:  Claribel presents with various aches and pains.  She has a vericose vein in her leg that bled. She is on Xarelto for a right leg DVT on Sept. 30. .   CT angio a week later was negative for PE.   She has lots of anxiety.  Has lots of chest pain.  The pain is not pleuretic.  The pain is not associated with exercise.  She does not have any pain while she is working but more often occurs with stress.   She is living with 2 elderly men now and has lots of stressful issues relating to that.    This seems to be a major cause of her pain.  She does have 83 year old SVGs  September 23, 2014:  Doing well from a cardiac standpoint. Lots of anxiety .  Has a open right heel ulcer.   Jan. 9, 2017  Shameca has been doing ok No CP.  No dyspnea Has had right  ankle pain and swelling  Occasionally has faster than normal heart rate,  No associated dyspnea , CP or dizziness.  Has had an upper respiratory tract infection  Recently   October 19, 2015:  Has had a UTI since I last saw her.  Was in the hospital  Had new A-fib with RVR with the hospitalization  Has converted back to NSR .   Aug 03, 2016:  No serious cardiac symptoms.   Has occasional episodes of tachycardia   Current Outpatient Prescriptions on File Prior to Visit  Medication Sig Dispense Refill  . acetaminophen (TYLENOL) 500 MG tablet Take 500 mg by mouth every 6 (six) hours as needed for mild pain.    Marland Kitchen acidophilus (RISAQUAD) CAPS capsule Take 1 capsule by mouth daily.    Marland Kitchen ALPRAZolam (XANAX) 1 MG tablet Take 0.5 mg by mouth 4 (four) times daily as needed for anxiety.     Marland Kitchen aspirin EC 81 MG tablet Take 81 mg by mouth daily.    Marland Kitchen DEXILANT 30 MG capsule  TAKE 1 CAPSULE (30 MG TOTAL) BY MOUTH DAILY. 30 capsule 11  . ferrous sulfate 325 (65 FE) MG tablet Take 1 tablet (325 mg total) by mouth daily with breakfast.    . furosemide (LASIX) 20 MG tablet Take 0.5 tablets (10 mg total) by mouth daily. 45 tablet 3  . glucosamine-chondroitin 500-400 MG tablet Take 1 tablet by mouth daily.     Marland Kitchen glucose blood (ONE TOUCH ULTRA TEST) test strip USE AS INSTRUCTED TO CHECK BLOOD SUGAR ONCE A DAY Dx code: E11.9 50 each 5  . HYDROcodone-acetaminophen (NORCO/VICODIN) 5-325 MG tablet Take 0.5-1 tablets by mouth every 6 (six) hours as needed for moderate pain. 60 tablet 0  . loperamide (IMODIUM) 2 MG capsule Take 2 mg by mouth 4 (four) times daily as needed for diarrhea or loose stools.    . metoprolol tartrate (LOPRESSOR) 25 MG tablet Take 25 mg by mouth 2 (two) times daily.    . Multiple Vitamin (MULTIVITAMIN) tablet Take 1 tablet by mouth daily. Patient reports takes a new vitamin to replace her centrum as recommended by her eye doctor.    . naproxen sodium (ANAPROX) 220 MG tablet Take 220 mg by mouth daily after breakfast.    . nitroGLYCERIN (NITROSTAT) 0.4 MG SL tablet Place 0.4 mg under the tongue every 5 (five) minutes as needed for chest pain.     Marland Kitchen nystatin-triamcinolone ointment (MYCOLOG) APPLY 1 APPLICATION TOPICALLY 2 (TWO) TIMES DAILY. AS NEEDED FOR ITCHING 60 g 1  . pioglitazone (ACTOS) 30 MG tablet TAKE 1 TABLET BY MOUTH EVERY DAY 30 tablet 11  . pravastatin (PRAVACHOL) 20 MG tablet TAKE 1 TABLET BY MOUTH DAILY. 30 tablet 6  . ramipril (ALTACE) 5 MG capsule Take 5 mg by mouth every evening.     . traZODone (DESYREL) 150 MG tablet Take 150 mg by mouth at bedtime.     . triamcinolone cream (KENALOG) 0.1 % APPLY 1 APPLICATION TOPICALLY 2 (TWO) TIMES DAILY.  1  . vitamin B-12 (CYANOCOBALAMIN) 1000 MCG tablet Take 1,000 mcg by mouth daily.    . vitamin E 400 UNIT capsule Take 400 Units by mouth daily.     No current facility-administered medications on  file prior to visit.     Allergies  Allergen Reactions  . Amoxicillin-Pot Clavulanate Other (See Comments)    Reaction:  Unknown   . Metformin And Related Diarrhea    Past Medical History:  Diagnosis Date  .  Acute deep vein thrombosis (DVT) of distal vein of right lower extremity ([place]) 12/2013   Acute thrombus of R proximal gastrocnemius vein. Symptomatic provoked distal DVT  . Anxiety   . Cellulitis of leg, left 01/16/2014  . Chronic back pain 03/2012   lumbar DDD with herniation - s/p L S1 transforaminal ESI (10/2012 Dr. Sharlet Salina)  . Chronic venous insufficiency   . Coronary artery disease    CABG 1998  . Cystocele   . Depression   . Diabetes mellitus    type 2, followed by endo.  . Diastolic dysfunction   . Diverticulosis   . Hemorrhoids   . History of heart attack   . Hx of echocardiogram    a. Echo 10/13: mild LVH, EF 55-60%, Gr 1 diast dysfn, PASP 32  . Hyperlipidemia   . Hypertension   . Irritable bowel syndrome   . Microcytic anemia   . PAD (peripheral artery disease) ([place]) 2013   ABI: R 0.91, L 0.83  . PUD (peptic ulcer disease)   . Varicose veins   . Venous ulcer of leg ([place]) 12/12/2013   Required prolonged treatment by wound center  . Viral gastroenteritis due to [place] virus 05/13/2015   hospitalization    Past Surgical History:  Procedure Laterality Date  . ABDOMINAL HYSTERECTOMY  1975   TAH,BSO  . ABI  2013   R 0.91, L 0.83  . APPENDECTOMY     per pt report  . CARDIOVASCULAR STRESS TEST  2015   low risk myoview (Kumar Falwell)  . CATARACT SURGERY    . CHOLECYSTECTOMY    . COLONOSCOPY  02/2010   extensive diverticulosis throughout, small internal hemorrhoids, rec rpt 5 yrs (Dr. Allyn Kenner)  . CORONARY ARTERY BYPASS GRAFT  1998  . dexa scan  09/2011   femur -0.8, forearm 0.6 => normal  . ENDOVENOUS ABLATION SAPHENOUS VEIN W/ LASER Left 03/2014   Kellie Simmering  . ESI  2014   L S1 transforaminal ESI x2 ([place]) without improvement  . HAND SURGERY    . lexiscan  myoview  03/2011   Small basal inferolateral and anterolateral reversible perfusion defect suggests ischemia.  EF was normal.   old infarct, no new ischemia  . toe surgery    . TYMPANOPLASTY  1960s   R side  . Vault prolapse A & P repair  2009   [facility]    History  Smoking Status  . Former Smoker  . Quit date: 03/05/1979  Smokeless Tobacco  . Never Used    History  Alcohol Use No    Family History  Problem Relation Age of Onset  . Hypertension Mother   . Stroke Mother   . Diabetes Father   . Diabetes Sister   . Diabetes Brother   . Colon cancer Neg Hx     Reviw of Systems:  Reviewed in the HPI.  All other systems are negative.  Physical Exam: BP 140/70 (BP Location: Right Arm, Patient Position: Sitting, Cuff Size: Normal)   Pulse 76   Ht 4\' 10"  (1.473 m)   Wt 151 lb 12.8 oz (68.9 kg)   SpO2 95%   BMI 31.73 kg/m  The patient is alert and oriented x 3.  The mood and affect are normal.   Skin: warm and dry.  Color is normal.    HEENT:   Normocephalic/atraumatic. Neck is supple. Her mucous membranes are moist. The JVD. Her carotids are normal. Lungs: Lung exam is clear.  Heart: Regular rate S1-S2. She has no murmurs.   Abdomen: Abdomen soft. Extremities:  No significant edema   Neuro:  Exam is nonfocal.    ECG:   Assessment / Plan:   1. Coronary artery disease-status post CABG in 1996 - she's doing very well. She's not had any episodes of angina. Continue current medications.  2. Paroxysmal atrial fib :  Has converted to NSR  Echo shows normal LV systolic function  3. Shortness of breath:     Still very short of breath. Echo showed normal LV function   4. Hypertension -  blood pressure is  well-controlled. 5. Hyperlipidemia   6. Right  DVT.  -   7. Anxiety:  Has lots of anxiety issues / depression issues Very tearful today  She clearly is not feeling well.   I dont think that her symptoms are cardiac.    Mertie Moores, MD   08/03/2016 2:43 PM    [address],  [place], [place]  [address] Pager [phone] Phone: [phone]; Fax: [phone]

## 2016-08-04 ENCOUNTER — Other Ambulatory Visit: Payer: Self-pay | Admitting: Gynecology

## 2016-08-30 ENCOUNTER — Other Ambulatory Visit: Payer: Self-pay | Admitting: Family Medicine

## 2016-09-02 ENCOUNTER — Other Ambulatory Visit: Payer: Self-pay | Admitting: Family Medicine

## 2016-09-13 ENCOUNTER — Other Ambulatory Visit: Payer: Self-pay

## 2016-09-13 MED ORDER — HYDROCODONE-ACETAMINOPHEN 5-325 MG PO TABS: 0.5000 | ORAL_TABLET | Freq: Four times a day (QID) | ORAL | 0 refills | Status: DC | PRN

## 2016-09-13 NOTE — Telephone Encounter (Signed)
Pt left v/m requesting rx hydrocodone apap. Call when ready for pick up. Pt last seen and rx last printed # 60 on 07/11/16. Pt is out of med and request cb ASAP.

## 2016-09-13 NOTE — Telephone Encounter (Signed)
Printed and in CMA box 

## 2016-09-14 NOTE — Telephone Encounter (Signed)
Patient notified by telephone that script is up front ready for pickup. 

## 2016-09-19 ENCOUNTER — Ambulatory Visit (INDEPENDENT_AMBULATORY_CARE_PROVIDER_SITE_OTHER): Payer: Medicare Other | Admitting: Podiatry

## 2016-09-19 ENCOUNTER — Encounter: Payer: Self-pay | Admitting: Podiatry

## 2016-09-19 DIAGNOSIS — B351 Tinea unguium: Secondary | ICD-10-CM | POA: Diagnosis not present

## 2016-09-19 DIAGNOSIS — E1149 Type 2 diabetes mellitus with other diabetic neurological complication: Secondary | ICD-10-CM

## 2016-09-19 DIAGNOSIS — M79674 Pain in right toe(s): Secondary | ICD-10-CM | POA: Diagnosis not present

## 2016-09-19 DIAGNOSIS — L84 Corns and callosities: Secondary | ICD-10-CM | POA: Diagnosis not present

## 2016-09-19 DIAGNOSIS — M79675 Pain in left toe(s): Secondary | ICD-10-CM

## 2016-09-19 DIAGNOSIS — M216X9 Other acquired deformities of unspecified foot: Secondary | ICD-10-CM

## 2016-09-21 NOTE — Progress Notes (Signed)
Subjective: 83 y.o. returns the office today for painful, elongated, thickened toenails which she cannot trim herself. Denies any redness or drainage around the nails. She also continues to get a painful callus to the ball of her right foot. Denies any drainage or pus. She puts Vaseline on this. Denies any acute changes since last appointment and no new complaints today. Denies any systemic complaints such as fevers, chills, nausea, vomiting.   Objective: AAO 3, NAD DP/PT pulses palpable 1/4, CRT less than 3 seconds Protective sensation decreased with Simms Weinstein monofilament, Achilles tendon reflex intact.  Nails hypertrophic, dystrophic, elongated, brittle, discolored 10. There is tenderness overlying the nails 1-5 bilaterally. There is no surrounding erythema or drainage along the nail sites. Hyperkeratic lesion to the right foot sub met 2. Upon debridement, no underlying ulceration, drainage, or signs of infection.  Prominent metatarsal head plantar. Hammertoes present.  Varicose veins are present.  No open lesions or pre-ulcerative lesions are identified. No other areas of tenderness bilateral lower extremities. No overlying edema, erythema, increased warmth. No pain with calf compression, swelling, warmth, erythema.  Assessment: Patient presents with symptomatic onychomycosis, hyperkeratotic lesion  Plan: -Treatment options including alternatives, risks, complications were discussed -Paperwork completed today for diabetic shoes. I did get her an accommodative insert in the meantime to see if this will help.  -Hyperkeratotic lesion sharply debrided x 1 without complications or bleeding. -Nails sharply debrided 10 without complication/bleeding. -Discussed daily foot inspection. If there are any changes, to call the office immediately.  -Follow-up in 3 months or sooner if any problems are to arise. In the meantime, encouraged to call the office with any questions, concerns, changes  symptoms.  Celesta Gentile, DPM

## 2016-09-22 ENCOUNTER — Encounter: Payer: Self-pay | Admitting: Family Medicine

## 2016-09-22 ENCOUNTER — Ambulatory Visit (INDEPENDENT_AMBULATORY_CARE_PROVIDER_SITE_OTHER): Payer: Medicare Other | Admitting: Family Medicine

## 2016-09-22 VITALS — BP 140/78 | HR 83 | Temp 98.0°F | Ht <= 58 in | Wt 146.5 lb

## 2016-09-22 DIAGNOSIS — I2583 Coronary atherosclerosis due to lipid rich plaque: Secondary | ICD-10-CM | POA: Diagnosis not present

## 2016-09-22 DIAGNOSIS — I83892 Varicose veins of left lower extremities with other complications: Secondary | ICD-10-CM

## 2016-09-22 DIAGNOSIS — I2510 Atherosclerotic heart disease of native coronary artery without angina pectoris: Secondary | ICD-10-CM

## 2016-09-22 NOTE — Assessment & Plan Note (Signed)
Bleeding has stopped. Placed hydrocolloid dressing for further protection. Discussed wound care. Recommended continue compression stocking use as well as further supportive care. Return if not healing as expected.

## 2016-09-22 NOTE — Patient Instructions (Addendum)
Hydrocolloid dressing (duoderm) placed to left inner ankle today. Keep on for about a week. Let us know if any worsening pain or spreading redness keep using compression stockings and elevate legs.

## 2016-09-22 NOTE — Progress Notes (Signed)
BP 140/78   Pulse 83   Temp 98 F (36.7 C)   Ht 4\' 10"  (1.473 m)   Wt 146 lb 8 oz (66.5 kg)   SpO2 97%   BMI 30.62 kg/m    CC: "I've got a busted vein" Subjective:    [name], female    DOB: [birth year], 83 y.o.   MRN: [id number]  HPI: [name] is a 83 y.o. female presenting on 09/22/2016 for Acute Visit (veins above ankle )   L>R prominent veins from her known CVI and varicose veins.  Last evening she had another vein break L medial ankle. EMS called and bleeding quickly stopped with compression. Here for recheck.  Has been evaluated by [facility] multiple times, no further intervention was recommended.  Relevant past medical, surgical, family and social history reviewed and updated as indicated. Interim medical history since our last visit reviewed. Allergies and medications reviewed and updated. Outpatient Medications Prior to Visit  Medication Sig Dispense Refill  . acetaminophen (TYLENOL) 500 MG tablet Take 500 mg by mouth every 6 (six) hours as needed for mild pain.    Marland Kitchen acidophilus (RISAQUAD) CAPS capsule Take 1 capsule by mouth daily.    Marland Kitchen ALPRAZolam (XANAX) 1 MG tablet Take 0.5 mg by mouth 4 (four) times daily as needed for anxiety.     Marland Kitchen aspirin EC 81 MG tablet Take 81 mg by mouth daily.    Marland Kitchen DEXILANT 30 MG capsule TAKE 1 CAPSULE (30 MG TOTAL) BY MOUTH DAILY. 30 capsule 11  . ferrous sulfate 325 (65 FE) MG tablet Take 1 tablet (325 mg total) by mouth daily with breakfast.    . furosemide (LASIX) 20 MG tablet TAKE 0.5 TABLETS (10 MG TOTAL) BY MOUTH DAILY. 45 tablet 3  . glucosamine-chondroitin 500-400 MG tablet Take 1 tablet by mouth daily.     Marland Kitchen glucose blood (ONE TOUCH ULTRA TEST) test strip USE AS INSTRUCTED TO CHECK BLOOD SUGAR ONCE A DAY Dx code: E11.9 50 each 5  . HYDROcodone-acetaminophen (NORCO/VICODIN) 5-325 MG tablet Take 0.5-1 tablets by mouth every 6 (six) hours as needed for moderate pain. 60 tablet 0  . loperamide (IMODIUM) 2 MG  capsule Take 2 mg by mouth 4 (four) times daily as needed for diarrhea or loose stools.    . metoprolol tartrate (LOPRESSOR) 25 MG tablet Take 25 mg by mouth 2 (two) times daily.    . Multiple Vitamin (MULTIVITAMIN) tablet Take 1 tablet by mouth daily. Patient reports takes a new vitamin to replace her centrum as recommended by her eye doctor.    . naproxen sodium (ANAPROX) 220 MG tablet Take 220 mg by mouth daily after breakfast.    . nitroGLYCERIN (NITROSTAT) 0.4 MG SL tablet Place 0.4 mg under the tongue every 5 (five) minutes as needed for chest pain.     Marland Kitchen nystatin-triamcinolone ointment (MYCOLOG) APPLY 1 APPLICATION TOPICALLY 2 (TWO) TIMES DAILY. AS NEEDED FOR ITCHING 60 g 3  . pioglitazone (ACTOS) 30 MG tablet TAKE 1 TABLET BY MOUTH EVERY DAY 30 tablet 11  . pravastatin (PRAVACHOL) 20 MG tablet TAKE 1 TABLET BY MOUTH DAILY. 30 tablet 4  . ramipril (ALTACE) 5 MG capsule Take 5 mg by mouth every evening.     . traZODone (DESYREL) 150 MG tablet Take 150 mg by mouth at bedtime.     . triamcinolone cream (KENALOG) 0.1 % APPLY 1 APPLICATION TOPICALLY 2 (TWO) TIMES DAILY.  1  . vitamin B-12 (  CYANOCOBALAMIN) 1000 MCG tablet Take 1,000 mcg by mouth daily.    . vitamin E 400 UNIT capsule Take 400 Units by mouth daily.     No facility-administered medications prior to visit.      Per HPI unless specifically indicated in ROS section below Review of Systems     Objective:    BP 140/78   Pulse 83   Temp 98 F (36.7 C)   Ht 4\' 10"  (1.473 m)   Wt 146 lb 8 oz (66.5 kg)   SpO2 97%   BMI 30.62 kg/m   Wt Readings from Last 3 Encounters:  09/22/16 146 lb 8 oz (66.5 kg)  08/03/16 151 lb 12.8 oz (68.9 kg)  07/11/16 151 lb 8 oz (68.7 kg)    Physical Exam  Constitutional: She appears well-developed and well-nourished. No distress.  Musculoskeletal:  Chronic varicose veins  Skin: Skin is warm and dry. No rash noted.  Prominent varicosities bilaterally L medial ankle with small sore without  further bleeding  Nursing note and vitals reviewed.  Results for orders placed or performed in visit on 06/23/16  HM MAMMOGRAPHY  Result Value Ref Range   HM Mammogram 0-4 Bi-Rad 0-4 Bi-Rad, Self Reported Normal      Assessment & Plan:   Problem List Items Addressed This Visit    Bleeding from varicose veins of lower extremity, left - Primary    Bleeding has stopped. Placed hydrocolloid dressing for further protection. Discussed wound care. Recommended continue compression stocking use as well as further supportive care. Return if not healing as expected.           Follow up plan: No Follow-up on file.  Ria Bush, MD

## 2016-10-24 ENCOUNTER — Other Ambulatory Visit: Payer: Self-pay | Admitting: *Deleted

## 2016-10-24 NOTE — Telephone Encounter (Signed)
Last Rx 09/13/2016. Last OV 09/2016

## 2016-10-25 MED ORDER — HYDROCODONE-ACETAMINOPHEN 5-325 MG PO TABS
0.5000 | ORAL_TABLET | Freq: Four times a day (QID) | ORAL | 0 refills | Status: DC | PRN
Start: 2016-10-25 — End: 2016-12-01

## 2016-10-25 NOTE — Telephone Encounter (Signed)
Printed.  Thanks.  

## 2016-10-25 NOTE — Telephone Encounter (Signed)
Patient notified by telephone that script is up front ready for pickup. 

## 2016-10-26 ENCOUNTER — Ambulatory Visit (INDEPENDENT_AMBULATORY_CARE_PROVIDER_SITE_OTHER): Payer: Medicare Other | Admitting: Endocrinology

## 2016-10-26 ENCOUNTER — Encounter: Payer: Self-pay | Admitting: Endocrinology

## 2016-10-26 VITALS — BP 126/66 | HR 60 | Ht <= 58 in | Wt 148.6 lb

## 2016-10-26 DIAGNOSIS — E1149 Type 2 diabetes mellitus with other diabetic neurological complication: Secondary | ICD-10-CM

## 2016-10-26 DIAGNOSIS — I2583 Coronary atherosclerosis due to lipid rich plaque: Secondary | ICD-10-CM | POA: Diagnosis not present

## 2016-10-26 DIAGNOSIS — E1165 Type 2 diabetes mellitus with hyperglycemia: Secondary | ICD-10-CM | POA: Diagnosis not present

## 2016-10-26 DIAGNOSIS — R5383 Other fatigue: Secondary | ICD-10-CM | POA: Diagnosis not present

## 2016-10-26 DIAGNOSIS — I2510 Atherosclerotic heart disease of native coronary artery without angina pectoris: Secondary | ICD-10-CM | POA: Diagnosis not present

## 2016-10-26 LAB — CBC WITH DIFFERENTIAL/PLATELET
BASOS ABS: 0.1 10*3/uL (ref 0.0–0.1)
BASOS PCT: 1 % (ref 0.0–3.0)
EOS PCT: 1.9 % (ref 0.0–5.0)
Eosinophils Absolute: 0.2 10*3/uL (ref 0.0–0.7)
HEMATOCRIT: 39.5 % (ref 36.0–46.0)
Hemoglobin: 12.8 g/dL (ref 12.0–15.0)
LYMPHS ABS: 1.9 10*3/uL (ref 0.7–4.0)
LYMPHS PCT: 22.8 % (ref 12.0–46.0)
MCHC: 32.4 g/dL (ref 30.0–36.0)
MCV: 94.2 fl (ref 78.0–100.0)
MONOS PCT: 9 % (ref 3.0–12.0)
Monocytes Absolute: 0.7 10*3/uL (ref 0.1–1.0)
NEUTROS ABS: 5.3 10*3/uL (ref 1.4–7.7)
Neutrophils Relative %: 65.3 % (ref 43.0–77.0)
PLATELETS: 225 10*3/uL (ref 150.0–400.0)
RBC: 4.2 Mil/uL (ref 3.87–5.11)
RDW: 14.1 % (ref 11.5–15.5)
WBC: 8.1 10*3/uL (ref 4.0–10.5)

## 2016-10-26 LAB — COMPREHENSIVE METABOLIC PANEL
ALK PHOS: 95 U/L (ref 39–117)
ALT: 14 U/L (ref 0–35)
AST: 20 U/L (ref 0–37)
Albumin: 3.8 g/dL (ref 3.5–5.2)
BILIRUBIN TOTAL: 0.4 mg/dL (ref 0.2–1.2)
BUN: 25 mg/dL — ABNORMAL HIGH (ref 6–23)
CALCIUM: 9.5 mg/dL (ref 8.4–10.5)
CO2: 31 meq/L (ref 19–32)
Chloride: 103 mEq/L (ref 96–112)
Creatinine, Ser: 1.05 mg/dL (ref 0.40–1.20)
GFR: 52.64 mL/min — AB (ref >60.00)
Glucose, Bld: 107 mg/dL — ABNORMAL HIGH (ref 70–99)
POTASSIUM: 4.8 meq/L (ref 3.5–5.1)
Sodium: 137 mEq/L (ref 135–145)
TOTAL PROTEIN: 6.8 g/dL (ref 6.0–8.3)

## 2016-10-26 LAB — TSH: TSH: 2.41 u[IU]/mL (ref 0.35–4.50)

## 2016-10-26 LAB — T4, FREE: FREE T4: 0.91 ng/dL (ref 0.60–1.60)

## 2016-10-26 LAB — POCT GLYCOSYLATED HEMOGLOBIN (HGB A1C): Hemoglobin A1C: 6.6

## 2016-10-26 NOTE — Progress Notes (Signed)
[name], female   DOB: [birth year], 83 y.o.   MRN: [id number]   Reason for Appointment: Diabetes follow-up   History of Present Illness   Diagnosis: Type 2 DIABETES MELITUS, date of diagnosis: 1991   She has had mild diabetes for several years and has been on Actos only since about 2001 Her A1c has generally been around or under 7% She was given a trial of metformin because of her difficulty with weight loss and also edema but she felt that it made her blood sugars go up and she will not try it again Also she thinks her blood sugars were higher with a trial of Januvia  RECENT history:  She is currently taking Actos 30 mg daily since 01/2014  Again she does not bring her monitor She thinks her blood sugar is periodically over 200 especially after meals However she is mostly taking after breakfast and she is eating a large meal with cereal, toast and eggs along with fruit and coffee in her sugar  A1c has generally been near 7% and now 6.6 She is somewhat active with doing housework Her weight is down slightly     Oral hypoglycemic drugs: Actos 30 mg daily        Side effects from medications: None Monitors blood glucose: ?  Frequency .    Glucometer: One Touch.           Blood Glucose readings from recall: Sometimes over 200 after meals especially breakfast, does not check readings before breakfast  Hypoglycemia frequency: Never.           Meals: 2  meals per day usually Physical activity:  minimal, is able to get drawn mostly with walker            Dietician visit: Most recent: Several years ago             Wt Readings from Last 3 Encounters:  10/26/16 148 lb 9.6 oz (67.4 kg)  09/22/16 146 lb 8 oz (66.5 kg)  08/03/16 151 lb 12.8 oz (68.9 kg)   Lab Results  Component Value Date   HGBA1C 6.8 04/27/2016   HGBA1C 6.8 12/30/2015   HGBA1C 7.1 (H) 09/27/2015   Lab Results  Component Value Date   MICROALBUR <0.7 04/27/2016   LDLCALC 56  12/30/2015   CREATININE 1.04 04/27/2016     Allergies as of 10/26/2016      Reactions   Amoxicillin-pot Clavulanate Other (See Comments)   Reaction:  Unknown    Metformin And Related Diarrhea      Medication List       Accurate as of 10/26/16  3:11 PM. Always use your most recent med list.          acetaminophen 500 MG tablet Commonly known as:  TYLENOL Take 500 mg by mouth every 6 (six) hours as needed for mild pain.   acidophilus Caps capsule Take 1 capsule by mouth daily.   ALPRAZolam 1 MG tablet Commonly known as:  XANAX Take 0.5 mg by mouth 4 (four) times daily as needed for anxiety.   aspirin EC 81 MG tablet Take 81 mg by mouth daily.   DEXILANT 30 MG capsule Generic drug:  Dexlansoprazole TAKE 1 CAPSULE (30 MG TOTAL) BY MOUTH DAILY.   ferrous sulfate 325 (65 FE) MG tablet Take 1 tablet (325 mg total) by mouth daily with breakfast.   furosemide 20 MG tablet Commonly known as:  LASIX TAKE 0.5 TABLETS (  10 MG TOTAL) BY MOUTH DAILY.   glucosamine-chondroitin 500-400 MG tablet Take 1 tablet by mouth daily.   glucose blood test strip Commonly known as:  ONE TOUCH ULTRA TEST USE AS INSTRUCTED TO CHECK BLOOD SUGAR ONCE A DAY Dx code: E11.9   HYDROcodone-acetaminophen 5-325 MG tablet Commonly known as:  NORCO/VICODIN Take 0.5-1 tablets by mouth every 6 (six) hours as needed for moderate pain.   loperamide 2 MG capsule Commonly known as:  IMODIUM Take 2 mg by mouth 4 (four) times daily as needed for diarrhea or loose stools.   metoprolol tartrate 25 MG tablet Commonly known as:  LOPRESSOR Take 25 mg by mouth 2 (two) times daily.   multivitamin tablet Take 1 tablet by mouth daily. Patient reports takes a new vitamin to replace her centrum as recommended by her eye doctor.   naproxen sodium 220 MG tablet Commonly known as:  ANAPROX Take 220 mg by mouth daily after breakfast.   nitroGLYCERIN 0.4 MG SL tablet Commonly known as:  NITROSTAT Place 0.4 mg  under the tongue every 5 (five) minutes as needed for chest pain.   nystatin-triamcinolone ointment Commonly known as:  MYCOLOG APPLY 1 APPLICATION TOPICALLY 2 (TWO) TIMES DAILY. AS NEEDED FOR ITCHING   pioglitazone 30 MG tablet Commonly known as:  ACTOS TAKE 1 TABLET BY MOUTH EVERY DAY   pravastatin 20 MG tablet Commonly known as:  PRAVACHOL TAKE 1 TABLET BY MOUTH DAILY.   ramipril 5 MG capsule Commonly known as:  ALTACE Take 5 mg by mouth every evening.   traZODone 150 MG tablet Commonly known as:  DESYREL Take 150 mg by mouth at bedtime.   triamcinolone cream 0.1 % Commonly known as:  KENALOG APPLY 1 APPLICATION TOPICALLY 2 (TWO) TIMES DAILY.   vitamin B-12 1000 MCG tablet Commonly known as:  CYANOCOBALAMIN Take 1,000 mcg by mouth daily.   vitamin E 400 UNIT capsule Take 400 Units by mouth daily.       Allergies:  Allergies  Allergen Reactions  . Amoxicillin-Pot Clavulanate Other (See Comments)    Reaction:  Unknown   . Metformin And Related Diarrhea    Past Medical History:  Diagnosis Date  . Acute deep vein thrombosis (DVT) of distal vein of right lower extremity ([place]) 12/2013   Acute thrombus of R proximal gastrocnemius vein. Symptomatic provoked distal DVT  . Anxiety   . Cellulitis of leg, left 01/16/2014  . Chronic back pain 03/2012   lumbar DDD with herniation - s/p L S1 transforaminal ESI (10/2012 Dr. Sharlet Salina)  . Chronic venous insufficiency   . Coronary artery disease    CABG 1998  . Cystocele   . Depression   . Diabetes mellitus    type 2, followed by endo.  . Diastolic dysfunction   . Diverticulosis   . Hemorrhoids   . History of heart attack   . Hx of echocardiogram    a. Echo 10/13: mild LVH, EF 55-60%, Gr 1 diast dysfn, PASP 32  . Hyperlipidemia   . Hypertension   . Irritable bowel syndrome   . Microcytic anemia   . PAD (peripheral artery disease) ([place]) 2013   ABI: R 0.91, L 0.83  . PUD (peptic ulcer disease)   . Varicose veins     . Venous ulcer of leg ([place]) 12/12/2013   Required prolonged treatment by wound center  . Viral gastroenteritis due to [place] virus 05/13/2015   hospitalization    Past Surgical History:  Procedure Laterality Date  .  ABDOMINAL HYSTERECTOMY  1975   TAH,BSO  . ABI  2013   R 0.91, L 0.83  . APPENDECTOMY     per pt report  . CARDIOVASCULAR STRESS TEST  2015   low risk myoview ([place])  . CATARACT SURGERY    . CHOLECYSTECTOMY    . COLONOSCOPY  02/2010   extensive diverticulosis throughout, small internal hemorrhoids, rec rpt 5 yrs (Dr. Allyn Kenner)  . CORONARY ARTERY BYPASS GRAFT  1998  . dexa scan  09/2011   femur -0.8, forearm 0.6 => normal  . ENDOVENOUS ABLATION SAPHENOUS VEIN W/ LASER Left 03/2014   Kellie Simmering  . ESI  2014   L S1 transforaminal ESI x2 ([place]) without improvement  . HAND SURGERY    . lexiscan myoview  03/2011   Small basal inferolateral and anterolateral reversible perfusion defect suggests ischemia.  EF was normal.   old infarct, no new ischemia  . toe surgery    . TYMPANOPLASTY  1960s   R side  . Vault prolapse A & P repair  2009   Dr Rhodia Albright [facility] hospital    Family History  Problem Relation Age of Onset  . Hypertension Mother   . Stroke Mother   . Diabetes Father   . Diabetes Sister   . Diabetes Brother   . Colon cancer Neg Hx     Social History:  reports that she quit smoking about 37 years ago. She has never used smokeless tobacco. She reports that she does not drink alcohol or use drugs.  Review of Systems:  She is Consistently complaining of fatigue and low stamina but is still able to do a lot of housework  Lab Results  Component Value Date   TSH 2.71 12/30/2015   TSH 1.82 10/31/2014   TSH 1.16 05/07/2012     HYPERTENSION:  well-controlled on Ramipril5 mg  Also takes Lasix 20 mg for edema daily prescribed by PCP    Lab Results  Component Value Date   CREATININE 1.04 04/27/2016   BUN 33 (H) 04/27/2016   NA 139 04/27/2016   K 4.5  04/27/2016   CL 102 04/27/2016   CO2 32 04/27/2016     HYPERLIPIDEMIA: The lipid abnormality consists of elevated LDL treated with pravastatin low-dose, followed by Cardiologist/PCP.  Lab Results  Component Value Date   CHOL 118 12/30/2015   HDL 45.40 12/30/2015   LDLCALC 56 12/30/2015   TRIG 80.0 12/30/2015   CHOLHDL 3 12/30/2015    Last foot exam in 10/2016      Examination:   BP 126/66   Pulse 60   Ht 4\' 10"  (1.473 m)   Wt 148 lb 9.6 oz (67.4 kg)   SpO2 97%   BMI 31.06 kg/m   Body mass index is 31.06 kg/m.    no lower leg edema present   Diabetic Foot Exam - Simple   Simple Foot Form Diabetic Foot exam was performed with the following findings:  Yes 10/26/2016  1:31 PM  Visual Inspection See comments:  Yes Sensation Testing See comments:  Yes Pulse Check See comments:  Yes Comments Pedal pulses barely palpable. She has significant lateral deviation of her first toes Moderate callus formation on the plantar surface of the right foot Mild decrease in monofilament sensation in the left toes    She has prominent veins superficially on both legs and feet No skin ulcers   ASSESSMENT/ PLAN:   The patient's diabetes control is appearing fairly good with  A1c6.6 Considering her  age and duration of diabetes her level of control is fairly good She is taking Actos without side effects  She claims that her blood sugars are high after meals but usually her lab glucose readings are fairly good She does not bring her monitor She appears to have very high carbohydrate meal in the morning and this was discussed  To continue Actos 30 mg once daily she reports intolerance to some other medications and does not want to take metformin or Januvia May potentially benefit from low-dose Jardiance if needed  Will  see her back in 3 months    Patient Instructions  Check blood sugars on waking up  about once a week  Also check blood sugars about 2 hours after a meal and do  this after different meals by rotation  Recommended blood sugar levels on waking up is 90-130 and about 2 hours after meal is 130-160  IMPORTANT: Please bring your blood sugar monitor to each visit, thank you  Reduce the portions of breakfast cereal to half Cut back on added sugar 2 drinks  Follow-up with primary care doctor about fatigue and headaches   Total visit time = 15 minutes  Barbarann Kelly 10/26/2016, 3:11 PM      Note: This office note was prepared with Dragon voice recognition system technology. Any transcriptional errors that result from this process are unintentional.

## 2016-10-26 NOTE — Patient Instructions (Signed)
Check blood sugars on waking up  about once a week  Also check blood sugars about 2 hours after a meal and do this after different meals by rotation  Recommended blood sugar levels on waking up is 90-130 and about 2 hours after meal is 130-160  IMPORTANT: Please bring your blood sugar monitor to each visit, thank you  Reduce the portions of breakfast cereal to half Cut back on added sugar 2 drinks  Follow-up with primary care doctor about fatigue and headaches

## 2016-10-28 ENCOUNTER — Other Ambulatory Visit: Payer: Self-pay | Admitting: Family Medicine

## 2016-11-09 ENCOUNTER — Other Ambulatory Visit: Payer: Self-pay | Admitting: Family Medicine

## 2016-11-10 NOTE — Telephone Encounter (Signed)
Last filled 12/16/15... Last OV with you 10/26/16... Please advise

## 2016-11-21 ENCOUNTER — Ambulatory Visit (INDEPENDENT_AMBULATORY_CARE_PROVIDER_SITE_OTHER): Payer: Medicare Other | Admitting: Podiatry

## 2016-11-21 DIAGNOSIS — M79675 Pain in left toe(s): Secondary | ICD-10-CM | POA: Diagnosis not present

## 2016-11-21 DIAGNOSIS — M79674 Pain in right toe(s): Secondary | ICD-10-CM

## 2016-11-21 DIAGNOSIS — B351 Tinea unguium: Secondary | ICD-10-CM

## 2016-11-21 DIAGNOSIS — M216X9 Other acquired deformities of unspecified foot: Secondary | ICD-10-CM

## 2016-11-21 DIAGNOSIS — L84 Corns and callosities: Secondary | ICD-10-CM

## 2016-11-23 NOTE — Progress Notes (Signed)
Subjective: 83 y.o. returns the office today for painful, elongated, thickened toenails which she cannot trim herself. Denies any redness or drainage around the nails. She also continues to get a painful callus to the ball of her right foot. Denies any drainage or pus. She puts Vaseline on this. Denies any acute changes since last appointment and no new complaints today. Denies any systemic complaints such as fevers, chills, nausea, vomiting.   Objective: AAO 3, NAD DP/PT pulses palpable 1/4, CRT less than 3 seconds Protective sensation decreased with Simms Weinstein monofilament, Achilles tendon reflex intact.  Nails hypertrophic, dystrophic, elongated, brittle, discolored 10. There is tenderness overlying the nails 1-5 bilaterally. There is no surrounding erythema or drainage along the nail sites. Hyperkeratic lesion to the right foot sub met 2. Upon debridement, no underlying ulceration, drainage, or signs of infection.  Prominent metatarsal head plantar. Hammertoes present.  Varicose veins are present.  No open lesions or pre-ulcerative lesions are identified. No other areas of tenderness bilateral lower extremities. No overlying edema, erythema, increased warmth. No pain with calf compression, swelling, warmth, erythema.  Assessment: Patient presents with symptomatic onychomycosis, hyperkeratotic lesion  Plan: -Treatment options including alternatives, risks, complications were discussed -Paperwork completed today for diabetic shoes. I did get her an accommodative insert in the meantime to see if this will help.  -Hyperkeratotic lesion sharply debrided x 1 without complications or bleeding. -Nails sharply debrided 10 without complication/bleeding. -Discussed daily foot inspection. If there are any changes, to call the office immediately.  -Follow-up in 3 months or sooner if any problems are to arise. In the meantime, encouraged to call the office with any questions, concerns, changes  symptoms.  Celesta Gentile, DPM

## 2016-11-29 ENCOUNTER — Encounter: Payer: Self-pay | Admitting: Family Medicine

## 2016-11-29 ENCOUNTER — Ambulatory Visit (INDEPENDENT_AMBULATORY_CARE_PROVIDER_SITE_OTHER): Payer: Medicare Other | Admitting: Family Medicine

## 2016-11-29 VITALS — BP 130/62 | HR 63 | Temp 98.4°F | Wt 147.2 lb

## 2016-11-29 DIAGNOSIS — I2510 Atherosclerotic heart disease of native coronary artery without angina pectoris: Secondary | ICD-10-CM

## 2016-11-29 DIAGNOSIS — L97219 Non-pressure chronic ulcer of right calf with unspecified severity: Secondary | ICD-10-CM | POA: Diagnosis not present

## 2016-11-29 DIAGNOSIS — I83212 Varicose veins of right lower extremity with both ulcer of calf and inflammation: Secondary | ICD-10-CM | POA: Diagnosis not present

## 2016-11-29 DIAGNOSIS — I2583 Coronary atherosclerosis due to lipid rich plaque: Secondary | ICD-10-CM

## 2016-11-29 NOTE — Progress Notes (Signed)
BP 130/62   Pulse 63   Temp 98.4 F (36.9 C) (Oral)   Wt 147 lb 4 oz (66.8 kg)   SpO2 95%   BMI 30.78 kg/m    CC: R leg rash Subjective:    [name], female    DOB: February 27, 1928, 83 y.o.   MRN: [id number]  HPI: [name] is a 83 y.o. female presenting on 11/29/2016 for Rash (right. denies any lifestyle changes)   She dropped detergent bottle on right anterior shin and has large bruise.  She wants red spots on right leg checked out.  Tender small openings R lateral lower leg.  Chronic varicose veins with inflammation.  Previously saw wound clinic with new-skin treatment for poorly healing venous ulcers.   Relevant past medical, surgical, family and social history reviewed and updated as indicated. Interim medical history since our last visit reviewed. Allergies and medications reviewed and updated. Outpatient Medications Prior to Visit  Medication Sig Dispense Refill  . acetaminophen (TYLENOL) 500 MG tablet Take 500 mg by mouth every 6 (six) hours as needed for mild pain.    Marland Kitchen acidophilus (RISAQUAD) CAPS capsule Take 1 capsule by mouth daily.    Marland Kitchen ALPRAZolam (XANAX) 1 MG tablet Take 0.5 mg by mouth 4 (four) times daily as needed for anxiety.     Marland Kitchen aspirin EC 81 MG tablet Take 81 mg by mouth daily.    Marland Kitchen DEXILANT 30 MG capsule TAKE 1 CAPSULE (30 MG TOTAL) BY MOUTH DAILY. 30 capsule 11  . ferrous sulfate 325 (65 FE) MG tablet Take 1 tablet (325 mg total) by mouth daily with breakfast.    . furosemide (LASIX) 20 MG tablet TAKE 0.5 TABLETS (10 MG TOTAL) BY MOUTH DAILY. 45 tablet 3  . glucosamine-chondroitin 500-400 MG tablet Take 1 tablet by mouth daily.     Marland Kitchen glucose blood (ONE TOUCH ULTRA TEST) test strip USE AS INSTRUCTED TO CHECK BLOOD SUGAR ONCE A DAY Dx code: E11.9 50 each 5  . HYDROcodone-acetaminophen (NORCO/VICODIN) 5-325 MG tablet Take 0.5-1 tablets by mouth every 6 (six) hours as needed for moderate pain. 60 tablet 0  . loperamide (IMODIUM) 2 MG  capsule Take 2 mg by mouth 4 (four) times daily as needed for diarrhea or loose stools.    . metoprolol tartrate (LOPRESSOR) 25 MG tablet TAKE 1.5 TABLETS (37.5 MG TOTAL) BY MOUTH 2 (TWO) TIMES DAILY. 270 tablet 0  . Multiple Vitamin (MULTIVITAMIN) tablet Take 1 tablet by mouth daily. Patient reports takes a new vitamin to replace her centrum as recommended by her eye doctor.    . naproxen sodium (ANAPROX) 220 MG tablet Take 220 mg by mouth daily after breakfast.    . nitroGLYCERIN (NITROSTAT) 0.4 MG SL tablet Place 0.4 mg under the tongue every 5 (five) minutes as needed for chest pain.     Marland Kitchen nystatin-triamcinolone ointment (MYCOLOG) APPLY 1 APPLICATION TOPICALLY 2 (TWO) TIMES DAILY. AS NEEDED FOR ITCHING 60 g 3  . pioglitazone (ACTOS) 30 MG tablet TAKE 1 TABLET BY MOUTH EVERY DAY 30 tablet 3  . pravastatin (PRAVACHOL) 20 MG tablet TAKE 1 TABLET BY MOUTH DAILY. 30 tablet 4  . ramipril (ALTACE) 5 MG capsule Take 5 mg by mouth every evening.     . traZODone (DESYREL) 150 MG tablet Take 150 mg by mouth at bedtime.     . triamcinolone cream (KENALOG) 0.1 % APPLY 1 APPLICATION TOPICALLY 2 (TWO) TIMES DAILY.  1  . vitamin  B-12 (CYANOCOBALAMIN) 1000 MCG tablet Take 1,000 mcg by mouth daily.    . vitamin E 400 UNIT capsule Take 400 Units by mouth daily.     No facility-administered medications prior to visit.      Per HPI unless specifically indicated in ROS section below Review of Systems     Objective:    BP 130/62   Pulse 63   Temp 98.4 F (36.9 C) (Oral)   Wt 147 lb 4 oz (66.8 kg)   SpO2 95%   BMI 30.78 kg/m   Wt Readings from Last 3 Encounters:  11/29/16 147 lb 4 oz (66.8 kg)  10/26/16 148 lb 9.6 oz (67.4 kg)  09/22/16 146 lb 8 oz (66.5 kg)    Physical Exam  Constitutional: She appears well-developed and well-nourished. No distress.  Musculoskeletal: She exhibits edema.  R leg with compression stocking in place - removed.  Small erosions/ulcers R lateral posterior ankle, very  tender Area cleaned with alcohol pad and dressed with hydrocolloid dressing  Nursing note and vitals reviewed.  Lab Results  Component Value Date   HGBA1C 6.6 10/26/2016    ABIs from 09/2015 - ABIs suggest moderate arterial insufficiency at rest bilaterally.    Assessment & Plan:   Problem List Items Addressed This Visit    Varicose veins of right lower extremity with both ulcer of calf and inflammation ([place]) - Primary    2 recurrent ulcers to posteriolateral R ankle  No signs of infection. Encouraged leg elevation. Will treat with hydrocolloid dressing (placed today) and wound clinic referral given extensive prior history of poorly healing wounds.  We have scheduled her for 9/10 so I asked her to return next week for wound check.       Relevant Orders   AMB referral to wound care center       Follow up plan: Return if symptoms worsen or fail to improve.  Ria Bush, MD

## 2016-11-29 NOTE — Patient Instructions (Signed)
See Rosaria Ferries on your way out to schedule wound clinic visit for open sores on right lateral ankle.

## 2016-11-29 NOTE — Assessment & Plan Note (Signed)
2 recurrent ulcers to posteriolateral R ankle  No signs of infection. Encouraged leg elevation. Will treat with hydrocolloid dressing (placed today) and wound clinic referral given extensive prior history of poorly healing wounds.  We have scheduled her for 9/10 so I asked her to return next week for wound check.

## 2016-12-01 ENCOUNTER — Other Ambulatory Visit: Payer: Self-pay | Admitting: *Deleted

## 2016-12-01 NOTE — Telephone Encounter (Signed)
Pt left voicemail at Triage requesting refill of med. Last filled on 10/25/16 #60 tablets with 0 refills

## 2016-12-02 MED ORDER — HYDROCODONE-ACETAMINOPHEN 5-325 MG PO TABS: 0.5000 | ORAL_TABLET | Freq: Four times a day (QID) | ORAL | 0 refills | Status: DC | PRN

## 2016-12-02 NOTE — Telephone Encounter (Signed)
Spoke to pt and informed her Rx is available for pickup from the front desk 

## 2016-12-02 NOTE — Telephone Encounter (Signed)
Printed and in CMA box 

## 2016-12-03 ENCOUNTER — Other Ambulatory Visit: Payer: Self-pay | Admitting: Family Medicine

## 2016-12-06 ENCOUNTER — Ambulatory Visit (INDEPENDENT_AMBULATORY_CARE_PROVIDER_SITE_OTHER): Payer: Medicare Other | Admitting: Family Medicine

## 2016-12-06 ENCOUNTER — Encounter: Payer: Self-pay | Admitting: Family Medicine

## 2016-12-06 VITALS — BP 128/66 | HR 69 | Temp 98.3°F | Wt 148.0 lb

## 2016-12-06 DIAGNOSIS — I83212 Varicose veins of right lower extremity with both ulcer of calf and inflammation: Secondary | ICD-10-CM | POA: Diagnosis not present

## 2016-12-06 DIAGNOSIS — I2510 Atherosclerotic heart disease of native coronary artery without angina pectoris: Secondary | ICD-10-CM | POA: Diagnosis not present

## 2016-12-06 DIAGNOSIS — L97219 Non-pressure chronic ulcer of right calf with unspecified severity: Secondary | ICD-10-CM

## 2016-12-06 DIAGNOSIS — I2583 Coronary atherosclerosis due to lipid rich plaque: Secondary | ICD-10-CM

## 2016-12-06 NOTE — Progress Notes (Signed)
BP 128/66   Pulse 69   Temp 98.3 F (36.8 C) (Oral)   Wt 148 lb (67.1 kg)   SpO2 95%   BMI 30.93 kg/m    CC: f/u leg check Subjective:    [name], female    DOB: 10/19/1927, 83 y.o.   MRN: [id number]  HPI: BASSY FETTERLY is a 83 y.o. female presenting on 12/06/2016 for Follow-up   See prior note for details.  Small erosions/ulcers R lateral posterior ankle dressed last week with hydrocolloid dressing. Given open skin and complicated history, I did refer her to wound clinic. Appt scheduled for next week.   Relevant past medical, surgical, family and social history reviewed and updated as indicated. Interim medical history since our last visit reviewed. Allergies and medications reviewed and updated. Outpatient Medications Prior to Visit  Medication Sig Dispense Refill  . acetaminophen (TYLENOL) 500 MG tablet Take 500 mg by mouth every 6 (six) hours as needed for mild pain.    Marland Kitchen acidophilus (RISAQUAD) CAPS capsule Take 1 capsule by mouth daily.    Marland Kitchen ALPRAZolam (XANAX) 1 MG tablet Take 0.5 mg by mouth 4 (four) times daily as needed for anxiety.     Marland Kitchen aspirin EC 81 MG tablet Take 81 mg by mouth daily.    Marland Kitchen DEXILANT 30 MG capsule TAKE 1 CAPSULE (30 MG TOTAL) BY MOUTH DAILY. 30 capsule 11  . ferrous sulfate 325 (65 FE) MG tablet Take 1 tablet (325 mg total) by mouth daily with breakfast.    . furosemide (LASIX) 20 MG tablet TAKE 0.5 TABLETS (10 MG TOTAL) BY MOUTH DAILY. 45 tablet 3  . glucosamine-chondroitin 500-400 MG tablet Take 1 tablet by mouth daily.     Marland Kitchen glucose blood (ONE TOUCH ULTRA TEST) test strip USE AS INSTRUCTED TO CHECK BLOOD SUGAR ONCE A DAY Dx code: E11.9 50 each 5  . HYDROcodone-acetaminophen (NORCO/VICODIN) 5-325 MG tablet Take 0.5-1 tablets by mouth every 6 (six) hours as needed for moderate pain. 60 tablet 0  . loperamide (IMODIUM) 2 MG capsule Take 2 mg by mouth 4 (four) times daily as needed for diarrhea or loose stools.    . metoprolol  tartrate (LOPRESSOR) 25 MG tablet TAKE 1.5 TABLETS (37.5 MG TOTAL) BY MOUTH 2 (TWO) TIMES DAILY. 270 tablet 0  . Multiple Vitamin (MULTIVITAMIN) tablet Take 1 tablet by mouth daily. Patient reports takes a new vitamin to replace her centrum as recommended by her eye doctor.    . naproxen sodium (ANAPROX) 220 MG tablet Take 220 mg by mouth daily after breakfast.    . nitroGLYCERIN (NITROSTAT) 0.4 MG SL tablet Place 0.4 mg under the tongue every 5 (five) minutes as needed for chest pain.     Marland Kitchen nystatin-triamcinolone ointment (MYCOLOG) APPLY 1 APPLICATION TOPICALLY 2 (TWO) TIMES DAILY. AS NEEDED FOR ITCHING 60 g 3  . pioglitazone (ACTOS) 30 MG tablet TAKE 1 TABLET BY MOUTH EVERY DAY 30 tablet 3  . pravastatin (PRAVACHOL) 20 MG tablet TAKE 1 TABLET BY MOUTH DAILY. 30 tablet 4  . ramipril (ALTACE) 5 MG capsule Take 5 mg by mouth every evening.     . ramipril (ALTACE) 5 MG capsule TAKE ONE CAPSULE BY EVERYDAY FOR BLOOD PRESSURE 30 capsule 6  . traZODone (DESYREL) 150 MG tablet Take 150 mg by mouth at bedtime.     . triamcinolone cream (KENALOG) 0.1 % APPLY 1 APPLICATION TOPICALLY 2 (TWO) TIMES DAILY.  1  . vitamin B-12 (CYANOCOBALAMIN)  1000 MCG tablet Take 1,000 mcg by mouth daily.    . vitamin E 400 UNIT capsule Take 400 Units by mouth daily.     No facility-administered medications prior to visit.      Per HPI unless specifically indicated in ROS section below Review of Systems     Objective:    BP 128/66   Pulse 69   Temp 98.3 F (36.8 C) (Oral)   Wt 148 lb (67.1 kg)   SpO2 95%   BMI 30.93 kg/m   Wt Readings from Last 3 Encounters:  12/06/16 148 lb (67.1 kg)  11/29/16 147 lb 4 oz (66.8 kg)  10/26/16 148 lb 9.6 oz (67.4 kg)    Physical Exam  Constitutional: She appears well-developed and well-nourished. No distress.  Walks with walker  Musculoskeletal: She exhibits no edema.  Chronic venous insufficiency changes and varicose veins  R lateral leg near ankle with erosion /venous  ulcer present smaller than dime size very tender to palpation  Nursing note and vitals reviewed.     Assessment & Plan:   Problem List Items Addressed This Visit    Varicose veins of right lower extremity with both ulcer of calf and inflammation ([place]) - Primary    Seem worse but without signs of cellulitis. Wounds cleaned with normal saline wash and treated with abx ointment, dressed with nonstick gauze and kerlex/coban.  Wound clinic appt is scheduled for next week. Pt does not qualify for home health as she continues to work.  RTC 2 days wound check.           Follow up plan: Return if symptoms worsen or fail to improve.  Ria Bush, MD

## 2016-12-06 NOTE — Patient Instructions (Addendum)
Keep wound clinic appointment next week  Continue dressing changes daily with antibiotic ointment.  Return in 2 days for wound check.

## 2016-12-06 NOTE — Assessment & Plan Note (Addendum)
Seem worse but without signs of cellulitis. Wounds cleaned with normal saline wash and treated with abx ointment, dressed with nonstick gauze and kerlex/coban.  Wound clinic appt is scheduled for next week. Pt does not qualify for home health as she continues to work.  RTC 2 days wound check.

## 2016-12-08 ENCOUNTER — Encounter: Payer: Self-pay | Admitting: Family Medicine

## 2016-12-08 ENCOUNTER — Ambulatory Visit (INDEPENDENT_AMBULATORY_CARE_PROVIDER_SITE_OTHER): Payer: Medicare Other | Admitting: Family Medicine

## 2016-12-08 VITALS — BP 104/62 | HR 68 | Temp 98.3°F | Wt 148.0 lb

## 2016-12-08 DIAGNOSIS — I2583 Coronary atherosclerosis due to lipid rich plaque: Secondary | ICD-10-CM | POA: Diagnosis not present

## 2016-12-08 DIAGNOSIS — I83212 Varicose veins of right lower extremity with both ulcer of calf and inflammation: Secondary | ICD-10-CM

## 2016-12-08 DIAGNOSIS — L97219 Non-pressure chronic ulcer of right calf with unspecified severity: Secondary | ICD-10-CM | POA: Diagnosis not present

## 2016-12-08 DIAGNOSIS — I2510 Atherosclerotic heart disease of native coronary artery without angina pectoris: Secondary | ICD-10-CM | POA: Diagnosis not present

## 2016-12-08 NOTE — Assessment & Plan Note (Signed)
Improving with current treatment. Reviewed home care, recommended dressing change on Saturday (2 days from now) and then keep wound clinic f/u on Monday. Pt agrees with plan.

## 2016-12-08 NOTE — Patient Instructions (Signed)
Wounds are looking better. Continue dressing change every 2 days (next due Saturday).  Keep Monday wound clinic appointment.

## 2016-12-08 NOTE — Progress Notes (Signed)
BP 104/62   Pulse 68   Temp 98.3 F (36.8 C) (Oral)   Wt 148 lb (67.1 kg)   SpO2 97%   BMI 30.93 kg/m    CC: 2d f/u visit Subjective:    [name], female    DOB: 04/19/1927, 83 y.o.   MRN: [id number]  HPI: [name] is a 83 y.o. female presenting on 12/08/2016 for Follow-up (2d)   See prior note for details.  Took coban off 2am, this morning as it was too tight.   Relevant past medical, surgical, family and social history reviewed and updated as indicated. Interim medical history since our last visit reviewed. Allergies and medications reviewed and updated. Outpatient Medications Prior to Visit  Medication Sig Dispense Refill  . acetaminophen (TYLENOL) 500 MG tablet Take 500 mg by mouth every 6 (six) hours as needed for mild pain.    Marland Kitchen acidophilus (RISAQUAD) CAPS capsule Take 1 capsule by mouth daily.    Marland Kitchen ALPRAZolam (XANAX) 1 MG tablet Take 0.5 mg by mouth 4 (four) times daily as needed for anxiety.     Marland Kitchen aspirin EC 81 MG tablet Take 81 mg by mouth daily.    Marland Kitchen DEXILANT 30 MG capsule TAKE 1 CAPSULE (30 MG TOTAL) BY MOUTH DAILY. 30 capsule 11  . ferrous sulfate 325 (65 FE) MG tablet Take 1 tablet (325 mg total) by mouth daily with breakfast.    . furosemide (LASIX) 20 MG tablet TAKE 0.5 TABLETS (10 MG TOTAL) BY MOUTH DAILY. 45 tablet 3  . glucosamine-chondroitin 500-400 MG tablet Take 1 tablet by mouth daily.     Marland Kitchen glucose blood (ONE TOUCH ULTRA TEST) test strip USE AS INSTRUCTED TO CHECK BLOOD SUGAR ONCE A DAY Dx code: E11.9 50 each 5  . HYDROcodone-acetaminophen (NORCO/VICODIN) 5-325 MG tablet Take 0.5-1 tablets by mouth every 6 (six) hours as needed for moderate pain. 60 tablet 0  . loperamide (IMODIUM) 2 MG capsule Take 2 mg by mouth 4 (four) times daily as needed for diarrhea or loose stools.    . metoprolol tartrate (LOPRESSOR) 25 MG tablet TAKE 1.5 TABLETS (37.5 MG TOTAL) BY MOUTH 2 (TWO) TIMES DAILY. 270 tablet 0  . Multiple Vitamin (MULTIVITAMIN)  tablet Take 1 tablet by mouth daily. Patient reports takes a new vitamin to replace her centrum as recommended by her eye doctor.    . naproxen sodium (ANAPROX) 220 MG tablet Take 220 mg by mouth daily after breakfast.    . nitroGLYCERIN (NITROSTAT) 0.4 MG SL tablet Place 0.4 mg under the tongue every 5 (five) minutes as needed for chest pain.     Marland Kitchen nystatin-triamcinolone ointment (MYCOLOG) APPLY 1 APPLICATION TOPICALLY 2 (TWO) TIMES DAILY. AS NEEDED FOR ITCHING 60 g 3  . pioglitazone (ACTOS) 30 MG tablet TAKE 1 TABLET BY MOUTH EVERY DAY 30 tablet 3  . pravastatin (PRAVACHOL) 20 MG tablet TAKE 1 TABLET BY MOUTH DAILY. 30 tablet 4  . ramipril (ALTACE) 5 MG capsule Take 5 mg by mouth every evening.     . ramipril (ALTACE) 5 MG capsule TAKE ONE CAPSULE BY EVERYDAY FOR BLOOD PRESSURE 30 capsule 6  . traZODone (DESYREL) 150 MG tablet Take 150 mg by mouth at bedtime.     . triamcinolone cream (KENALOG) 0.1 % APPLY 1 APPLICATION TOPICALLY 2 (TWO) TIMES DAILY.  1  . vitamin B-12 (CYANOCOBALAMIN) 1000 MCG tablet Take 1,000 mcg by mouth daily.    . vitamin E 400 UNIT capsule  Take 400 Units by mouth daily.     No facility-administered medications prior to visit.      Per HPI unless specifically indicated in ROS section below Review of Systems     Objective:    BP 104/62   Pulse 68   Temp 98.3 F (36.8 C) (Oral)   Wt 148 lb (67.1 kg)   SpO2 97%   BMI 30.93 kg/m   Wt Readings from Last 3 Encounters:  12/08/16 148 lb (67.1 kg)  12/06/16 148 lb (67.1 kg)  11/29/16 147 lb 4 oz (66.8 kg)    Physical Exam  Constitutional: She appears well-developed and well-nourished. No distress.  Walks with walker  Musculoskeletal: She exhibits edema (1+ pitting RLE above bandage).  Chronic venous insufficiency changes and varicose veins  R lateral leg near ankle with erosion/venous ulcer that is healing well without surrounding erythema or drainage. New shallow abrasion R medial ankle  Nursing note and  vitals reviewed.  Wounds cleaned with alcohol and dressed with abx ointment, gauze, and kerlex/coban wrap. Pt tolerated well.     Assessment & Plan:   Problem List Items Addressed This Visit    Varicose veins of right lower extremity with both ulcer of calf and inflammation ([place]) - Primary    Improving with current treatment. Reviewed home care, recommended dressing change on Saturday (2 days from now) and then keep wound clinic f/u on Monday. Pt agrees with plan.            Follow up plan: No Follow-up on file.  Ria Bush, MD

## 2016-12-12 ENCOUNTER — Ambulatory Visit
Admission: RE | Admit: 2016-12-12 | Discharge: 2016-12-12 | Disposition: A | Discharge status: Home / Self Care | Payer: Medicare Other | Source: Ambulatory Visit | Attending: Surgery | Admitting: Surgery

## 2016-12-12 ENCOUNTER — Encounter: Payer: Medicare Other | Attending: Surgery | Admitting: Surgery

## 2016-12-12 ENCOUNTER — Other Ambulatory Visit: Payer: Self-pay | Admitting: Surgery

## 2016-12-12 DIAGNOSIS — M79604 Pain in right leg: Secondary | ICD-10-CM

## 2016-12-12 DIAGNOSIS — I87331 Chronic venous hypertension (idiopathic) with ulcer and inflammation of right lower extremity: Secondary | ICD-10-CM | POA: Insufficient documentation

## 2016-12-12 DIAGNOSIS — L539 Erythematous condition, unspecified: Secondary | ICD-10-CM

## 2016-12-12 DIAGNOSIS — R609 Edema, unspecified: Secondary | ICD-10-CM

## 2016-12-12 DIAGNOSIS — I82401 Acute embolism and thrombosis of unspecified deep veins of right lower extremity: Secondary | ICD-10-CM | POA: Insufficient documentation

## 2016-12-12 DIAGNOSIS — L03115 Cellulitis of right lower limb: Secondary | ICD-10-CM | POA: Diagnosis not present

## 2016-12-12 DIAGNOSIS — M7989 Other specified soft tissue disorders: Secondary | ICD-10-CM | POA: Insufficient documentation

## 2016-12-12 DIAGNOSIS — Z87891 Personal history of nicotine dependence: Secondary | ICD-10-CM | POA: Diagnosis not present

## 2016-12-12 DIAGNOSIS — E11622 Type 2 diabetes mellitus with other skin ulcer: Secondary | ICD-10-CM | POA: Diagnosis not present

## 2016-12-12 DIAGNOSIS — R52 Pain, unspecified: Secondary | ICD-10-CM

## 2016-12-12 DIAGNOSIS — Z951 Presence of aortocoronary bypass graft: Secondary | ICD-10-CM | POA: Diagnosis not present

## 2016-12-12 DIAGNOSIS — I87311 Chronic venous hypertension (idiopathic) with ulcer of right lower extremity: Secondary | ICD-10-CM | POA: Diagnosis not present

## 2016-12-12 DIAGNOSIS — L97811 Non-pressure chronic ulcer of other part of right lower leg limited to breakdown of skin: Secondary | ICD-10-CM | POA: Diagnosis not present

## 2016-12-12 DIAGNOSIS — I10 Essential (primary) hypertension: Secondary | ICD-10-CM | POA: Insufficient documentation

## 2016-12-13 NOTE — Progress Notes (Signed)
KASHA, HOWETH ([id number]) Visit Report for 12/12/2016 Abuse/Suicide Risk Screen Details Patient Name: Konecny, Jontavia C. Date of Service: 12/12/2016 12:45 PM Medical Record Number: [id number] Patient Account Number: [id number] Date of Birth/Sex: 05-19-1927 (83 y.o. Female) Treating RN: Cornell Barman Primary Care Lyfe Monger: Ria Bush Other Clinician: Referring Sincere Berlanga: Referral, Self Treating Genae Strine/Extender: Frann Rider in Treatment: 0 Abuse/Suicide Risk Screen Items Answer ABUSE/SUICIDE RISK SCREEN: Has anyone close to you tried to hurt or harm you recentlyo No Do you feel uncomfortable with anyone in your familyo No Has anyone forced you do things that you didnot want to doo No Do you have any thoughts of harming yourselfo No Patient displays signs or symptoms of abuse and/or neglect. No Electronic Signature(s) Signed: 12/13/2016 8:10:04 AM By: Gretta Cool, BSN, RN, CWS, Kim RN, BSN Entered By: Gretta Cool, BSN, RN, CWS, Kim on 12/12/2016 12:59:37 Arman, [name] ([id number]) -------------------------------------------------------------------------------- Activities of Daily Living Details Patient Name: Ewell, Derriana C. Date of Service: 12/12/2016 12:45 PM Medical Record Number: [id number] Patient Account Number: [id number] Date of Birth/Sex: [birth year] (83 y.o. Female) Treating RN: Cornell Barman Primary Care Kanton Kamel: Ria Bush Other Clinician: Referring Nicholad Kautzman: Referral, Self Treating Terius Jacuinde/Extender: Frann Rider in Treatment: 0 Activities of Daily Living Items Answer Activities of Daily Living (Please select one for each item) Drive Automobile Need Assistance Take Medications Need Assistance Use Telephone Need Assistance Care for Appearance Need Assistance Use Toilet Need Counsellor / Shower Need Assistance Dress Self Need Assistance Feed Self Need Assistance Walk Need Assistance Get In / Out Bed Need Assistance Housework Need  Assistance Prepare Meals Need Assistance Handle Money Need Assistance Shop for Self Need Assistance Electronic Signature(s) Signed: 12/13/2016 8:10:04 AM By: Gretta Cool, BSN, RN, CWS, Kim RN, BSN Entered By: Gretta Cool, BSN, RN, CWS, Kim on 12/12/2016 12:59:50 Aydin, [name] ([id number]) -------------------------------------------------------------------------------- Education Assessment Details Patient Name: Novosad, Spencer C. Date of Service: 12/12/2016 12:45 PM Medical Record Number: [id number] Patient Account Number: [id number] Date of Birth/Sex: Feb 13, 1928 (83 y.o. Female) Treating RN: Cornell Barman Primary Care Ina Scrivens: Ria Bush Other Clinician: Referring Doyl Bitting: Referral, Self Treating Tyrrell Stephens/Extender: Frann Rider in Treatment: 0 Learning Preferences/Education Level/Primary Language Learning Preference: Explanation, Demonstration Highest Education Level: High School Preferred Language: English Cognitive Barrier Assessment/Beliefs Language Barrier: No Translator Needed: No Memory Deficit: No Emotional Barrier: No Cultural/Religious Beliefs Affecting Medical No Care: Physical Barrier Assessment Impaired Vision: No Impaired Hearing: No Decreased Hand dexterity: No Knowledge/Comprehension Assessment Knowledge Level: High Comprehension Level: High Ability to understand written High instructions: Ability to understand verbal High instructions: Motivation Assessment Anxiety Level: Calm Cooperation: Cooperative Education Importance: Acknowledges Need Interest in Health Problems: Asks Questions Perception: Coherent Willingness to Engage in Self- High Management Activities: Readiness to Engage in Self- High Management Activities: Electronic Signature(s) Signed: 12/13/2016 8:10:04 AM By: Gretta Cool, BSN, RN, CWS, Kim RN, BSN Bouie, [place] ([id number]) Entered By: Gretta Cool, BSN, RN, CWS, Kim on 12/12/2016 13:00:19 Tasso, [name]  ([id number]) -------------------------------------------------------------------------------- Fall Risk Assessment Details Patient Name: Galeana, Reagyn C. Date of Service: 12/12/2016 12:45 PM Medical Record Number: [id number] Patient Account Number: [id number] Date of Birth/Sex: 28-May-1927 (83 y.o. Female) Treating RN: Cornell Barman Primary Care Liliya Fullenwider: Ria Bush Other Clinician: Referring Kevaughn Ewing: Referral, Self Treating Christpoher Sievers/Extender: Frann Rider in Treatment: 0 Fall Risk Assessment Items Have you had 2 or more falls in the last 12 monthso 0 Yes Have you had any fall that resulted in injury in the last 12 monthso 0 Yes FALL RISK ASSESSMENT: History of falling - immediate or  within 3 months 0 No Secondary diagnosis 0 No Ambulatory aid None/bed rest/wheelchair/nurse 0 Yes Crutches/cane/walker 15 Yes Furniture 0 No IV Access/Saline Lock 0 No Gait/Training Normal/bed rest/immobile 0 Yes Weak 0 No Impaired 0 No Mental Status Oriented to own ability 0 Yes Electronic Signature(s) Signed: 12/13/2016 8:10:04 AM By: Gretta Cool, BSN, RN, CWS, Kim RN, BSN Entered By: Gretta Cool, BSN, RN, CWS, Kim on 12/12/2016 13:00:54 Mayeda, [name] ([id number]) -------------------------------------------------------------------------------- Foot Assessment Details Patient Name: Sulewski, Caprina C. Date of Service: 12/12/2016 12:45 PM Medical Record Number: [id number] Patient Account Number: [id number] Date of Birth/Sex: 11/15/1927 (83 y.o. Female) Treating RN: Cornell Barman Primary Care Valiant Dills: Ria Bush Other Clinician: Referring Grettel Rames: Referral, Self Treating Latavion Halls/Extender: Frann Rider in Treatment: 0 Foot Assessment Items Site Locations + = Sensation present, - = Sensation absent, C = Callus, U = Ulcer R = Redness, W = Warmth, M = Maceration, PU = Pre-ulcerative lesion F = Fissure, S = Swelling, D = Dryness Assessment Right: Left: Other Deformity: No No Prior  Foot Ulcer: No No Prior Amputation: No No Charcot Joint: No No Ambulatory Status: Ambulatory With Help Assistance Device: Walker GaitEnergy manager) Signed: 12/13/2016 8:10:04 AM By: Gretta Cool, BSN, RN, CWS, Kim RN, BSN Entered By: Gretta Cool, BSN, RN, CWS, Kim on 12/12/2016 13:02:30 Kobler, [name] ([id number]) -------------------------------------------------------------------------------- Nutrition Risk Assessment Details Patient Name: Berg, Jemya C. Date of Service: 12/12/2016 12:45 PM Medical Record Number: [id number] Patient Account Number: [id number] Date of Birth/Sex: 06-14-1927 (83 y.o. Female) Treating RN: Cornell Barman Primary Care Rafael Quesada: Ria Bush Other Clinician: Referring Morna Flud: Referral, Self Treating Lacrystal Barbe/Extender: Frann Rider in Treatment: 0 Height (in): 58 Weight (lbs): 148 Body Mass Index (BMI): 30.9 Nutrition Risk Assessment Items NUTRITION RISK SCREEN: I have an illness or condition that made me change the kind and/or 0 No amount of food I eat I eat fewer than two meals per day 0 No I eat few fruits and vegetables, or milk products 2 Yes I have three or more drinks of beer, liquor or wine almost every day 0 No I have tooth or mouth problems that make it hard for me to eat 0 No I don't always have enough money to buy the food I need 0 No I eat alone most of the time 0 No I take three or more different prescribed or over-the-counter drugs a 1 Yes day Without wanting to, I have lost or gained 10 pounds in the last six 0 No months I am not always physically able to shop, cook and/or feed myself 0 No Nutrition Protocols Good Risk Protocol Provide education on elevated blood sugars Moderate Risk Protocol 0 and impact on wound healing, as applicable Electronic Signature(s) Signed: 12/13/2016 8:10:04 AM By: Gretta Cool, BSN, RN, CWS, Kim RN, BSN Entered By: Gretta Cool, BSN, RN, CWS, Kim on 12/12/2016 13:01:18

## 2016-12-13 NOTE — Progress Notes (Signed)
KANYLA, OMEARA ([id number]) Visit Report for 12/12/2016 Allergy List Details Patient Name: Jared, Iliany C. Date of Service: 12/12/2016 12:45 PM Medical Record Number: [id number] Patient Account Number: [id number] Date of Birth/Sex: [birth year] (83 y.o. Female) Treating RN: Cornell Barman Primary Care Jhene Westmoreland: Ria Bush Other Clinician: Referring Areatha Kalata: Referral, Self Treating Nainoa Woldt/Extender: Frann Rider in Treatment: 0 Allergies Active Allergies No Known Allergies Allergy Notes Electronic Signature(s) Signed: 12/13/2016 8:10:04 AM By: Gretta Cool, BSN, RN, CWS, Kim RN, BSN Entered By: Gretta Cool, BSN, RN, CWS, Kim on 12/12/2016 12:57:03 Lastra, [name] ([id number]) -------------------------------------------------------------------------------- Arrival Information Details Patient Name: Froio, Ebonique C. Date of Service: 12/12/2016 12:45 PM Medical Record Number: [id number] Patient Account Number: [id number] Date of Birth/Sex: 04/03/1928 (83 y.o. Female) Treating RN: Cornell Barman Primary Care Heinz Eckert: Ria Bush Other Clinician: Referring Kayde Warehime: Referral, Self Treating Anaka Beazer/Extender: Frann Rider in Treatment: 0 Visit Information Patient Arrived: Walker Arrival Time: 12:49 Accompanied By: son in lobby Transfer Assistance: None Patient Identification Verified: Yes Secondary Verification Process Yes Completed: Patient Has Alerts: Yes Patient Alerts: Patient on Blood Thinner 81mg  aspirin History Since Last Visit Added or deleted any medications: No Any new allergies or adverse reactions: No Had a fall or experienced change in activities of daily living that may affect risk of falls: No Signs or symptoms of abuse/neglect since last visito No Hospitalized since last visit: No Has Dressing in Place as Prescribed: Yes Electronic Signature(s) Signed: 12/13/2016 8:10:04 AM By: Gretta Cool, BSN, RN, CWS, Kim RN, BSN Entered By: Gretta Cool, BSN, RN, CWS, Kim on  12/12/2016 12:50:24 Pevey, [name] ([id number]) -------------------------------------------------------------------------------- Clinic Level of Care Assessment Details Patient Name: Mcknight, Berthe C. Date of Service: 12/12/2016 12:45 PM Medical Record Number: [id number] Patient Account Number: [id number] Date of Birth/Sex: December 22, 1927 (83 y.o. Female) Treating RN: Cornell Barman Primary Care Murrell Elizondo: Ria Bush Other Clinician: Referring Abbygail Willhoite: Referral, Self Treating Caryle Helgeson/Extender: Frann Rider in Treatment: 0 Clinic Level of Care Assessment Items TOOL 2 Quantity Score []  - Use when only an EandM is performed on the INITIAL visit 0 ASSESSMENTS - Nursing Assessment / Reassessment X - General Physical Exam (combine w/ comprehensive assessment (listed just 1 20 below) when performed on new pt. evals) X - Comprehensive Assessment (HX, ROS, Risk Assessments, Wounds Hx, etc.) 1 25 ASSESSMENTS - Wound and Skin Assessment / Reassessment []  - Simple Wound Assessment / Reassessment - one wound 0 X - Complex Wound Assessment / Reassessment - multiple wounds 2 5 []  - Dermatologic / Skin Assessment (not related to wound area) 0 ASSESSMENTS - Ostomy and/or Continence Assessment and Care []  - Incontinence Assessment and Management 0 []  - Ostomy Care Assessment and Management (repouching, etc.) 0 PROCESS - Coordination of Care X - Simple Patient / Family Education for ongoing care 1 15 []  - Complex (extensive) Patient / Family Education for ongoing care 0 X - Staff obtains Programmer, systems, Records, Test Results / Process Orders 1 10 []  - Staff telephones HHA, Nursing Homes / Clarify orders / etc 0 []  - Routine Transfer to another Facility (non-emergent condition) 0 []  - Routine Hospital Admission (non-emergent condition) 0 X - New Admissions / Biomedical engineer / Ordering NPWT, Apligraf, etc. 1 15 []  - Emergency Hospital Admission (emergent condition) 0 X - Simple Discharge  Coordination 1 10 Seyler, Miyako C. ([id number]) []  - Complex (extensive) Discharge Coordination 0 PROCESS - Special Needs []  - Pediatric / Minor Patient Management 0 []  - Isolation Patient Management 0 []  - Hearing / Language / Visual special needs 0 []  -  Assessment of Community assistance (transportation, D/C planning, etc.) 0 []  - Additional assistance / Altered mentation 0 []  - Support Surface(s) Assessment (bed, cushion, seat, etc.) 0 INTERVENTIONS - Wound Cleansing / Measurement X - Wound Imaging (photographs - any number of wounds) 1 5 []  - Wound Tracing (instead of photographs) 0 []  - Simple Wound Measurement - one wound 0 X - Complex Wound Measurement - multiple wounds 2 5 []  - Simple Wound Cleansing - one wound 0 X - Complex Wound Cleansing - multiple wounds 2 5 INTERVENTIONS - Wound Dressings []  - Small Wound Dressing one or multiple wounds 0 X - Medium Wound Dressing one or multiple wounds 1 15 []  - Large Wound Dressing one or multiple wounds 0 []  - Application of Medications - injection 0 INTERVENTIONS - Miscellaneous []  - External ear exam 0 []  - Specimen Collection (cultures, biopsies, blood, body fluids, etc.) 0 []  - Specimen(s) / Culture(s) sent or taken to Lab for analysis 0 []  - Patient Transfer (multiple staff / Civil Service fast streamer / Similar devices) 0 []  - Simple Staple / Suture removal (25 or less) 0 []  - Complex Staple / Suture removal (26 or more) 0 Seiter, Parisha C. ([id number]) []  - Hypo / Hyperglycemic Management (close monitor of Blood Glucose) 0 []  - Ankle / Brachial Index (ABI) - do not check if billed separately 0 Has the patient been seen at the hospital within the last three years: Yes Total Score: 145 Level Of Care: New/Established - Level 4 Electronic Signature(s) Signed: 12/13/2016 8:10:04 AM By: Gretta Cool, BSN, RN, CWS, Kim RN, BSN Entered By: Gretta Cool, BSN, RN, CWS, Kim on 12/12/2016 16:41:11 Sawhney, [name]  ([id number]) -------------------------------------------------------------------------------- Encounter Discharge Information Details Patient Name: Biasi, Dennisse C. Date of Service: 12/12/2016 12:45 PM Medical Record Number: [id number] Patient Account Number: [id number] Date of Birth/Sex: [birth year] (83 y.o. Female) Treating RN: Cornell Barman Primary Care Lyndzie Zentz: Ria Bush Other Clinician: Referring Chozen Latulippe: Referral, Self Treating Gwendalyn Mcgonagle/Extender: Frann Rider in Treatment: 0 Encounter Discharge Information Items Schedule Follow-up Appointment: No Medication Reconciliation completed No and provided to Patient/Care Davinder Haff: Provided on Clinical Summary of Care: 12/12/2016 Form Type Recipient Paper Patient LL Electronic Signature(s) Signed: 12/13/2016 8:13:46 AM By: Ruthine Dose Entered By: Ruthine Dose on 12/12/2016 13:34:36 Sawdey, [name] ([id number]) -------------------------------------------------------------------------------- Lower Extremity Assessment Details Patient Name: Castagnola, Theo C. Date of Service: 12/12/2016 12:45 PM Medical Record Number: [id number] Patient Account Number: [id number] Date of Birth/Sex: 17-Jan-1928 (83 y.o. Female) Treating RN: Cornell Barman Primary Care Keymon Mcelroy: Ria Bush Other Clinician: Referring Hezekiah Veltre: Referral, Self Treating Mavis Gravelle/Extender: Frann Rider in Treatment: 0 Edema Assessment Assessed: [Left: No] [Right: No] E[Left: dema] [Right: :] Calf Left: Right: Point of Measurement: 28 cm From Medial Instep 28 cm 29.3 cm Ankle Left: Right: Point of Measurement: 9 cm From Medial Instep 18.3 cm 19 cm Vascular Assessment Pulses: Dorsalis Pedis Palpable: [Left:No] [Right:No] Doppler Audible: [Left:Inaudible] [Right:Yes] Posterior Tibial Extremity colors, hair growth, and conditions: Extremity Color: [Left:Mottled] [Right:Red] Hair Growth on Extremity: [Left:No] [Right:No] Temperature of  Extremity: [Left:Warm] [Right:Hot] Capillary Refill: [Left:< 3 seconds] [Right:< 3 seconds] Toe Nail Assessment Left: Right: Thick: Yes Yes Discolored: Yes Yes Deformed: Yes Yes Improper Length and Hygiene: No No Notes Unable to get ABI in right DT pain, redness and swelling. Left pulse inaudible. Electronic Signature(s) Signed: 12/13/2016 8:10:04 AM By: Gretta Cool, BSN, RN, CWS, Kim RN, BSN Victorino, Traer ([id number]) Entered By: Gretta Cool, BSN, RN, CWS, Kim on 12/12/2016 13:13:31 Shearman, [name] ([id number]) -------------------------------------------------------------------------------- Multi Wound Chart  Details Patient Name: Mitnick, Emberlin C. Date of Service: 12/12/2016 12:45 PM Medical Record Number: [id number] Patient Account Number: [id number] Date of Birth/Sex: 1927/09/26 (83 y.o. 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 Lower Extremity Comorbid History: Cataracts, Anemia, Cataracts, Anemia, N/A Arrhythmia, Coronary Arrhythmia, Coronary Artery Disease, Artery Disease, Hypertension, Myocardial Hypertension, Myocardial Infarction, Type II Infarction, Type II Diabetes, Osteoarthritis Diabetes, Osteoarthritis Date Acquired: 10/24/2016 11/28/2016 N/A Weeks of Treatment: 0 0 N/A Wound Status: Open Open N/A Measurements L x W x D 1x0.5x0.1 0.1x0.1x0.1 N/A (cm) Area (cm) : 0.393 0.008 N/A Volume (cm) : 0.039 0.001  N/A % Reduction in Area: 0.00% 0.00% N/A % Reduction in Volume: 0.00% 0.00% N/A Classification: Grade 1 Grade 0 N/A Exudate Amount: Large Large N/A Exudate Type: Serous Serous N/A Exudate Color: amber amber N/A Wound Margin: Indistinct, nonvisible Indistinct, nonvisible N/A Eschbach, Yaileen C. ([id number]) Granulation Amount: None Present (0%) None Present (0%) N/A Necrotic Amount: Large (67-100%) None Present (0%) N/A Exposed Structures: Fascia: No Fascia: No N/A Fat Layer (Subcutaneous Fat Layer (Subcutaneous Tissue) Exposed: No Tissue) Exposed: No Tendon: No Tendon: No Muscle: No Muscle: No Joint: No Joint: No Bone: No Bone: No Limited to Skin Breakdown Epithelialization: None Large (67-100%) N/A Periwound Skin Texture: Excoriation: No Excoriation: No N/A Induration: No Induration: No Callus: No Callus: No Crepitus: No Crepitus: No Rash: No Rash: No Scarring: No Scarring: No Periwound Skin Maceration: Yes Maceration: Yes N/A Moisture: Dry/Scaly: No Dry/Scaly: No Periwound Skin Color: Atrophie Blanche: No Atrophie Blanche: No N/A Cyanosis: No Cyanosis: No Ecchymosis: No Ecchymosis: No Erythema: No Erythema: No Hemosiderin Staining: No Hemosiderin Staining: No Mottled: No Mottled: No Pallor: No Pallor: No Rubor: No Rubor: No Tenderness on No No N/A Palpation: Wound Preparation: Ulcer Cleansing: Ulcer Cleansing: N/A Rinsed/Irrigated with Rinsed/Irrigated with Saline Saline Topical Anesthetic Topical Anesthetic Applied: Other: lidocaine Applied: Other: lidociane 4% 4% Treatment Notes Electronic Signature(s) Signed: 12/12/2016 4:10:50 PM By: Christin Fudge MD, FACS Entered By: Christin Fudge on 12/12/2016 13:31:58 Harr, [name] ([id number]) -------------------------------------------------------------------------------- [place] Details Patient Name: Alcorn, Karsten C. Date of Service: 12/12/2016 12:45 PM Medical Record  Number: [id number] Patient Account Number: [id number] Date of Birth/Sex: 11/28/1927 (83 y.o. Female) Treating RN: Cornell Barman Primary Care Romain Erion: Ria Bush Other Clinician: Referring Gaye Scorza: Referral, Self Treating Litzy Dicker/Extender: Frann Rider in Treatment: 0 Active Inactive ` Orientation to the Wound Care Program Nursing Diagnoses: Knowledge deficit related to the wound healing center program Goals: Patient/caregiver will verbalize understanding of the [facility] Program Date Initiated: 12/12/2016 Target Resolution Date: 12/26/2016 Goal Status: Active Interventions: Provide education on orientation to the wound center Notes: ` Venous Leg Ulcer Nursing Diagnoses: Actual venous Insuffiency (use after diagnosis is confirmed) Goals: Verify adequate tissue perfusion prior to therapeutic compression application Date Initiated: 12/12/2016 Target Resolution Date: 12/12/2016 Goal Status: Active Interventions: Assess peripheral edema status every visit. Notes: ` Wound/Skin Impairment Nursing Diagnoses: Impaired tissue integrity Ringer, Vona C. ([id number]) Goals: Ulcer/skin breakdown will heal within 14 weeks Date Initiated: 12/12/2016 Target Resolution Date: 03/13/2017 Goal Status: Active Interventions: Assess ulceration(s)  every visit Notes: Electronic Signature(s) Signed: 12/13/2016 8:10:04 AM By: Gretta Cool, BSN, RN, CWS, Kim RN, BSN Entered By: Gretta Cool, BSN, RN, CWS, Kim on 12/12/2016 13:03:29 Arroyave, [name] ([id number]) -------------------------------------------------------------------------------- Pain Assessment Details Patient Name: Lauver, Jezebelle C. Date of Service: 12/12/2016 12:45 PM Medical Record Number: [id number] Patient Account Number: [id number] Date of Birth/Sex: 02-16-1928 (83 y.o. Female) Treating RN: Cornell Barman Primary Care Brittaney Beaulieu: Ria Bush Other Clinician: Referring Scotti Kosta: Referral, Self Treating  Shanaya Schneck/Extender: Frann Rider in Treatment: 0 Active Problems Location of Pain Severity and Description of Pain Patient Has [name] Yes Site Locations Pain Location: Pain in Ulcers With Dressing Change: Yes Rate the pain. Current Pain Level: 5 Character of Pain Describe the Pain: Burning, Tender Pain Management and Medication Current Pain Management: Electronic Signature(s) Signed: 12/13/2016 8:10:04 AM By: Gretta Cool, BSN, RN, CWS, Kim RN, BSN Entered By: Gretta Cool, BSN, RN, CWS, Kim on 12/12/2016 12:51:18 Hisaw, [name] ([id number]) -------------------------------------------------------------------------------- Patient/Caregiver Education Details Patient Name: Kobel, Shae C. Date of Service: 12/12/2016 12:45 PM Medical Record Number: [id number] Patient Account Number: [id number] Date of Birth/Gender: 10-Aug-1927 (83 y.o. Female) Treating RN: Cornell Barman Primary Care Physician: Ria Bush Other Clinician: Referring Physician: Referral, Self Treating Physician/Extender: Frann Rider in Treatment: 0 Education Assessment Education Provided To: Patient Education Topics Provided Wound/Skin Impairment: Handouts: Caring for Your Ulcer Methods: Demonstration Responses: State content correctly Electronic Signature(s) Signed: 12/13/2016 8:10:04 AM By: Gretta Cool, BSN, RN, CWS, Kim RN, BSN Entered By: Gretta Cool, BSN, RN, CWS, Kim on 12/12/2016 16:38:38 Saar, [name] ([id number]) -------------------------------------------------------------------------------- Wound Assessment Details Patient Name: Bordner, Miata C. Date of Service: 12/12/2016 12:45 PM Medical Record Number: [id number] Patient Account Number: [id number] Date of Birth/Sex: 1927/06/08 (83 y.o. Female) Treating RN: Cornell Barman Primary Care Thayer Inabinet: Ria Bush Other Clinician: Referring Aryiah Monterosso: Referral, Self Treating Violet Seabury/Extender: Frann Rider in Treatment: 0 Wound Status Wound  Number: 4 Primary Diabetic Wound/Ulcer of the Lower Etiology: Extremity Wound Location: Right Lower Leg - Lateral Wound Open Wounding Event: Gradually Appeared Status: Date Acquired: 10/24/2016 Comorbid Cataracts, Anemia, Arrhythmia, Weeks Of Treatment: 0 History: Coronary Artery Disease, Hypertension, Clustered Wound: No Myocardial Infarction, Type II Diabetes, Osteoarthritis Photos Wound Measurements Length: (cm) 1 Width: (cm) 0.5 Depth: (cm) 0.1 Area: (cm) 0.393 Volume: (cm) 0.039 % Reduction in Area: 0% % Reduction in Volume: 0% Epithelialization: None Tunneling: No Undermining: No Wound Description Classification: Grade 1 Foul Odor Afte Wound Margin: Indistinct, nonvisible Slough/Fibrino Exudate Amount: Large Exudate Type: Serous Exudate Color: amber r Cleansing: No Yes Wound Bed Granulation Amount: None Present (0%) Exposed Structure Necrotic Amount: Large (67-100%) Fascia Exposed: No Necrotic Quality: Adherent Slough Fat Layer (Subcutaneous Tissue) Exposed: No Tendon Exposed: No Muscle Exposed: No Joint Exposed: No Mcquire, Lachlan C. ([id number]) Bone Exposed: No Limited to Skin Breakdown Periwound Skin Texture Texture Color No Abnormalities Noted: No No Abnormalities Noted: No Callus: No Atrophie Blanche: No Crepitus: No Cyanosis: No Excoriation: No Ecchymosis: No Induration: No Erythema: No Rash: No Hemosiderin Staining: No Scarring: No Mottled: No Pallor: No Moisture Rubor: No No Abnormalities Noted: No Dry / Scaly: No Maceration: Yes Wound Preparation Ulcer Cleansing: Rinsed/Irrigated with Saline Topical Anesthetic Applied: Other: lidocaine 4%, Treatment Notes Wound #4 (Right, Lateral Lower Leg) 1. Cleansed with: Clean wound with Normal Saline 2. Anesthetic Topical Lidocaine 4% cream to wound bed prior to debridement 4. Dressing Applied: Aquacel Ag 5. Secondary Dressing Applied ABD and Kerlix/Conform Electronic  Signature(s) Signed: 12/13/2016 8:10:04 AM By: Gretta Cool, BSN, RN, CWS, Kim RN, BSN Entered By: Gretta Cool,  BSN, RN, CWS, Kim on 12/12/2016 13:10:49 Birchmeier, [name] ([id number]) -------------------------------------------------------------------------------- Wound Assessment Details Patient Name: Runnion, Modean C. Date of Service: 12/12/2016 12:45 PM Medical Record Number: [id number] Patient Account Number: [id number] Date of Birth/Sex: May 27, 1927 (83 y.o. Female) Treating RN: Cornell Barman Primary Care Hedwig Mcfall: Ria Bush Other Clinician: Referring Lancer Thurner: Referral, Self Treating Erroll Wilbourne/Extender: Frann Rider in Treatment: 0 Wound Status Wound Number: 5 Primary Diabetic Wound/Ulcer of the Lower Etiology: Extremity Wound Location: Right Lower Leg - Medial Wound Open Wounding Event: Gradually Appeared Status: Date Acquired: 11/28/2016 Comorbid Cataracts, Anemia, Arrhythmia, Weeks Of Treatment: 0 History: Coronary Artery Disease, Hypertension, Clustered Wound: No Myocardial Infarction, Type II Diabetes, Osteoarthritis Photos Wound Measurements Length: (cm) 0.1 Width: (cm) 0.1 Depth: (cm) 0.1 Area: (cm) 0.008 Volume: (cm) 0.001 % Reduction in Area: 0% % Reduction in Volume: 0% Epithelialization: Large (67-100%) Tunneling: No Undermining: No Wound Description Classification: Grade 0 Foul Odor Afte Wound Margin: Indistinct, nonvisible Slough/Fibrino Exudate Amount: Large Exudate Type: Serous Exudate Color: amber r Cleansing: No No Wound Bed Granulation Amount: None Present (0%) Exposed Structure Necrotic Amount: None Present (0%) Fascia Exposed: No Fat Layer (Subcutaneous Tissue) Exposed: No Tendon Exposed: No Muscle Exposed: No Joint Exposed: No Sovine, Janille C. ([id number]) Bone Exposed: No Periwound Skin Texture Texture Color No Abnormalities Noted: No No Abnormalities Noted: No Callus: No Atrophie Blanche: No Crepitus: No Cyanosis:  No Excoriation: No Ecchymosis: No Induration: No Erythema: No Rash: No Hemosiderin Staining: No Scarring: No Mottled: No Pallor: No Moisture Rubor: No No Abnormalities Noted: No Dry / Scaly: No Maceration: Yes Wound Preparation Ulcer Cleansing: Rinsed/Irrigated with Saline Topical Anesthetic Applied: Other: lidociane 4%, Treatment Notes Wound #5 (Right, Medial Lower Leg) 1. Cleansed with: Clean wound with Normal Saline 2. Anesthetic Topical Lidocaine 4% cream to wound bed prior to debridement 4. Dressing Applied: Aquacel Ag 5. Secondary Dressing Applied ABD and Kerlix/Conform Electronic Signature(s) Signed: 12/13/2016 8:10:04 AM By: Gretta Cool, BSN, RN, CWS, Kim RN, BSN Entered By: Gretta Cool, BSN, RN, CWS, Kim on 12/12/2016 13:10:22 Lawes, [name] ([id number]) -------------------------------------------------------------------------------- [place] Details Patient Name: Sabas, Joniqua C. Date of Service: 12/12/2016 12:45 PM Medical Record Number: [id number] Patient Account Number: [id number] Date of Birth/Sex: 04/09/1927 (83 y.o. Female) Treating RN: Cornell Barman Primary Care Suleiman Finigan: Ria Bush Other Clinician: Referring Eveleigh Crumpler: Referral, Self Treating Rosalyn Archambault/Extender: Frann Rider in Treatment: 0 Vital Signs Time Taken: 12:51 Temperature (F): 98.1 Height (in): 58 Pulse (bpm): 70 Source: Measured Respiratory Rate (breaths/min): 16 Weight (lbs): 148 Blood Pressure (mmHg): 136/90 Source: Measured Reference Range: 80 - 120 mg / dl Body Mass Index (BMI): 30.9 Electronic Signature(s) Signed: 12/13/2016 8:10:04 AM By: Gretta Cool, BSN, RN, CWS, Kim RN, BSN Entered By: Gretta Cool, BSN, RN, CWS, Kim on 12/12/2016 12:51:55

## 2016-12-14 DIAGNOSIS — F4321 Adjustment disorder with depressed mood: Secondary | ICD-10-CM | POA: Diagnosis not present

## 2016-12-14 DIAGNOSIS — F411 Generalized anxiety disorder: Secondary | ICD-10-CM | POA: Diagnosis not present

## 2016-12-14 DIAGNOSIS — F429 Obsessive-compulsive disorder, unspecified: Secondary | ICD-10-CM | POA: Diagnosis not present

## 2016-12-15 NOTE — Progress Notes (Signed)
BRANDEE, MARKIN ([id number]) Visit Report for 12/12/2016 Chief Complaint Document Details Patient Name: [name], [name] Date of Service: 12/12/2016 12:45 PM Medical Record Number: [id number] Patient Account Number: [id number] Date of Birth/Sex: 08/25/1927 (83 y.o. Female) Treating RN: Cornell Barman Primary Care Provider: Ria Bush Other Clinician: Referring Provider: Referral, Self Treating Provider/Extender: Frann Rider in Treatment: 0 Information Obtained from: Patient Chief Complaint Patient presents for treatment of an open ulcer due to venous insufficiency, associated redness and swelling for about 2 weeks Electronic Signature(s) Signed: 12/12/2016 4:10:50 PM By: Christin Fudge MD, FACS Entered By: Christin Fudge on 12/12/2016 13:32:31 Booze, [name] ([id number]) -------------------------------------------------------------------------------- HPI Details Patient Name: [name], [name] Date of Service: 12/12/2016 12:45 PM Medical Record Number: [id number] Patient Account Number: [id number] Date of Birth/Sex: Jul 01, 1927 (83 y.o. Female) Treating RN: Cornell Barman Primary Care Provider: Ria Bush Other Clinician: Referring Provider: Referral, Self Treating Provider/Extender: Frann Rider in Treatment: 0 History of Present Illness Location: swelling, redness and ulcer in the right lower extremity for 2 weeks Quality: Patient reports experiencing burning to affected area(s). Severity: Patient states wound are getting worse. Duration: Patient has had the wound for < 2 weeks prior to presenting for treatment Timing: Pain in wound is Intermittent (comes and goes Context: The wound appeared gradually over time Modifying Factors: Consults to this date include:not seen any physician recently Associated Signs and Symptoms: Patient reports having difficulty standing for long periods. HPI Description: 83 year old patient who is known toes from a lengthy  previous consultation between May 2016 to May 2017 now returns to [place] for a swelling, redness and pain of her right lower extremity for about 2 weeks. She says she has been wearing her compression stockings and most recently has not been seen by any other physician. No other significant changes in the recent past and no recent admissions to hospital. ========== Old notes: This is an 83 yrs old female we have been following since the beginning of January for a wound on the right medial ankle. She had previously had venous ablation of the greater saphenous vein by Dr. Kellie Simmering. She came to [place] with erythema of the right leg and subsequently is opened in the medial aspect of the right ankle. She has not been very compliant with compression. We ordered her at juxta lite stocking, however she apparently has not been able to wear it. The area on the medial aspect of her left ankle has much less induration and erythema. The edema is better controlled. No evidence of infection is noted. 07/18/14; no major change in the condition of this wound. There is still surface slough and probably some drainage. 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 All other veins showed no evidence of DVT, superficial venous thrombosis or incompetence. however she does have manifestations of chronic venous hypertension and I believe we will change her dressing over to Prisma and a Profore light dressing. she sees her primary care doctor regularly and her last hemoglobin A1c has been around 7%.her other comorbidities are hypertension, CABG in 1998, peptic ulcer disease, history of echo with a ejection fraction of 55-60% and grade 1 diastolic dysfunction. past surgical history include coronary artery bypass graft in 1998, cholecystectomy, appendectomy, hand surgery, endovenous ablation of the saphenous vein  with laser by Dr. Kellie Simmering in December 2015. 08/29/2014 - she complains of increased calf pain over  the past week. removed her compression bandage and pain improved. no fever or chills. Mild clear drainage. SABRIA, FLORIDO ([id number]) 09/04/2014 -- she has not been compliant and has not done a dressing change as we had ordered last week. She always complains and does not keep her wrap on and this past week she has done the same. She has completed a course of doxycycline. 09/25/2014 -- she has tolerated her dressing all week and has not removed it. She says it's hurting a lot today and would not like any debridement. We will do so as per her wishes. she did get a appointment with the vascular surgeon and sees him on 6/28. 10/03/2014 - She was seen by Dr. Tinnie Gens 09/30/2014. Assessment: I reviewed the previous venous duplex exam study performed in September 2015 which reveals no options for treating this venous ulcer. There is no significant superficial venous reflux to treat with laser ablation.Most recent ABI was 0.91 in the right foot with palpable dorsalis pedis pulse at 3+ Plan: patient needs to return to the [place] wound center and be treated by Dr. Con Memos. No arterial or venous options available from vascular standpoint or treatment of right leg stasis ulcer.Continued dressing changes and compression is the treatment of choice 10/16/2014 -- today will be her first application of Apligraf. 10/23/2014 -- we are going to be able to apply her second day of Apligraf next week. She is complaining of some tenderness in the region of Achilles tendon and this is possibly due to her wrap. 11/06/2014 -- she did not have Apligraf last week because the wound was not looking good but she says she is feeling much better now. 11/13/2014 -- she has been doing well this week and she is here for her second Apligraf. 11/27/2014 -- she is here for her third application of Apligraf 12/12/2014 -- she  is here for her fourth application of Apligraf. Also during these past 3 days she had an injury to her left forearm which she caught against the edge of her desk and has a lacerated wound which has gone down to the subcutaneous tissue. 12/26/2014 -- she is here for a last/fifth application of Apligraf. 04/10/2015 -- the patient is here for her first application of Puraply. 04/17/2015 -- the patient is here for her second application of Puraply. 04/24/2015 -- her Puraply was not ordered for today and hence he would use a collagen dressing locally. 05/01/2015 -- the patient is here for her third application of Puraply. 05/08/2015 -- the patient has been doing fine and overall has shown much improvement and is here for a routine visit 05/15/2015 -- she was admitted to the hospital with viral gastroenteritis and acute renal failure with dehydration and has been on supportive care since February 7. She was discharged today and has come for her wound care. Details of her hospital admission and therapy reviewed. 07/28/15; this is a patient I know from previous stays at the [facility] wound care center. I see she was most recently here predominantly for a venous insufficiency ulcer on the right lower medial leg. Apparently she phones our nurses on a regular basis and there was concern generated that she actually had a lower extremity wound. She arrives with a difficult to follow litany of complaints against the various compression garments we have supplied including I believe juxtalite stockings and 2 layer compression. She seems to have settled on wearing just one layer of the 2 layer compression. I'm really not certain at this  point what her problem was with the juxtalite stockings 08/17/2015 -- she has been using some silver product on her lower extremity on the right and was concerned about that blackish discoloration. He has no open wounds. She has been wearing some compression stockings which may  be the 15-20 mm type. ========== Lori, Nanci C. ([id number]) Electronic Signature(s) Signed: 12/12/2016 4:10:50 PM By: Christin Fudge MD, FACS Entered By: Christin Fudge on 12/12/2016 13:35:11 Schwartzkopf, [name] ([id number]) -------------------------------------------------------------------------------- Physical Exam Details Patient Name: [name], [name] Date of Service: 12/12/2016 12:45 PM Medical Record Number: [id number] Patient Account Number: [id number] Date of Birth/Sex: 18-Jul-1927 (83 y.o. Female) Treating RN: Cornell Barman Primary Care Provider: Ria Bush Other Clinician: Referring Provider: Referral, Self Treating Provider/Extender: Frann Rider in Treatment: 0 Constitutional . Pulse regular. Respirations normal and unlabored. Afebrile. . Eyes Nonicteric. Reactive to light. Ears, Nose, Mouth, and Throat Lips, teeth, and gums WNL.Marland Kitchen Moist mucosa without lesions. Neck supple and nontender. No palpable supraclavicular or cervical adenopathy. Normal sized without goiter. Respiratory WNL. No retractions.. Breath sounds WNL, No rubs, rales, rhonchi, or wheeze.. Cardiovascular . Pedal Pulses WNL. ABIs were nonobtainable. she has a swelling, redness and tenderness of her right lower extremity. Chest Breasts symmetical and no nipple discharge.. Breast tissue WNL, no masses, lumps, or tenderness.. Gastrointestinal (GI) Abdomen without masses or tenderness.. No liver or spleen enlargement or tenderness.. Lymphatic No adneopathy. No adenopathy. No adenopathy. Musculoskeletal Adexa without tenderness or enlargement.. Digits and nails w/o clubbing, cyanosis, infection, petechiae, ischemia, or inflammatory conditions.. Integumentary (Hair, Skin) No suspicious lesions. No crepitus or fluctuance. No peri-wound warmth or erythema. No masses.Marland Kitchen Psychiatric Judgement and insight Intact.. No evidence of depression, anxiety, or agitation.. Notes her right lower extremity is  red, tender and swollen and there is minimal ulceration on the right lower third of the leg with minimal drainage of fluid. No debridement was required Electronic Signature(s) Signed: 12/12/2016 4:10:50 PM By: Christin Fudge MD, FACS Entered By: Christin Fudge on 12/12/2016 13:36:28 Klingbeil, [name] ([id number]) Yeakel, [name] ([id number]) -------------------------------------------------------------------------------- Physician Orders Details Patient Name: [name], [name] Date of Service: 12/12/2016 12:45 PM Medical Record Number: [id number] Patient Account Number: [id number] Date of Birth/Sex: [birth year] (83 y.o. Female) Treating RN: Cornell Barman Primary Care Provider: Ria Bush Other Clinician: Referring Provider: Referral, Self Treating Provider/Extender: Frann Rider in Treatment: 0 Verbal / Phone Orders: No Diagnosis Coding Wound Cleansing Wound #4 Right,Lateral Lower Leg o Clean wound with Normal Saline. Wound #5 Right,Medial Lower Leg o Clean wound with Normal Saline. Anesthetic Wound #4 Right,Lateral Lower Leg o Topical Lidocaine 4% cream applied to wound bed prior to debridement Wound #5 Right,Medial Lower Leg o Topical Lidocaine 4% cream applied to wound bed prior to debridement Primary Wound Dressing Wound #4 Right,Lateral Lower Leg o Aquacel Ag Wound #5 Right,Medial Lower Leg o Aquacel Ag Secondary Dressing Wound #4 Right,Lateral Lower Leg o ABD pad o Conform/Kerlix Wound #5 Right,Medial Lower Leg o ABD pad o Conform/Kerlix Dressing Change Frequency Wound #4 Right,Lateral Lower Leg o Change dressing every other day. Wound #5 Right,Medial Lower Leg o Change dressing every other day. CORNIE, MCCOMBER ([id number]) Follow-up Appointments Wound #4 Right,Lateral Lower Leg o Return Appointment in 1 week. Wound #5 Right,Medial Lower Leg o Return Appointment in 1 week. Edema Control Wound #4 Right,Lateral Lower  Leg o Elevate legs to the level of the heart and pump ankles as often as possible Wound #5 Right,Medial Lower Leg o Elevate legs to the level of the  heart and pump ankles as often as possible Additional Orders / Instructions Wound #4 Right,Lateral Lower Leg o Other: - Stay off leg for the next few days. Wound #5 Right,Medial Lower Leg o Other: - Stay off leg for the next few days. Medications-please add to medication list. Wound #4 Right,Lateral Lower Leg o P.O. Antibiotics Wound #5 Right,Medial Lower Leg o P.O. Antibiotics [employer] Services o DVT US Patient Medications Allergies: No Known Allergies Notifications Medication Indication Start End doxycycline hyclate 12/12/2016 DOSE oral 100 mg capsule - capsule oral bid Electronic Signature(s) Signed: 12/13/2016 8:10:04 AM By: Gretta Cool, BSN, RN, CWS, Kim RN, BSN Signed: 12/14/2016 7:54:52 AM By: Christin Fudge MD, FACS Previous Signature: 12/12/2016 1:37:40 PM Version By: Christin Fudge MD, FACS Entered By: Gretta Cool, BSN, RN, CWS, Kim on 12/12/2016 16:34:34 Taft, [name] ([id number]) Blume, Averianna C. ([id number]) -------------------------------------------------------------------------------- Problem List Details Patient Name: Knipple, Harold C. Date of Service: 12/12/2016 12:45 PM Medical Record Number: [id number] Patient Account Number: [id number] Date of Birth/Sex: 1928/03/10 (83 y.o. Female) Treating RN: Cornell Barman Primary Care Provider: Ria Bush Other Clinician: Referring Provider: Referral, Self Treating Provider/Extender: Frann Rider in Treatment: 0 Active Problems ICD-10 Encounter Code Description Active Date Diagnosis E11.622 Type 2 diabetes mellitus with other skin ulcer 12/12/2016 Yes I87.331 Chronic venous hypertension (idiopathic) with ulcer and 12/12/2016 Yes inflammation of right lower extremity I82.401 Acute embolism and thrombosis of unspecified deep veins 12/12/2016 Yes of right lower  extremity L03.115 Cellulitis of right lower limb 12/12/2016 Yes Inactive Problems Resolved Problems Electronic Signature(s) Signed: 12/12/2016 4:10:50 PM By: Christin Fudge MD, FACS Entered By: Christin Fudge on 12/12/2016 13:31:49 Shipp, [name] ([id number]) -------------------------------------------------------------------------------- Progress Note Details Patient Name: Kielbasa, Dustin C. Date of Service: 12/12/2016 12:45 PM Medical Record Number: [id number] Patient Account Number: [id number] Date of Birth/Sex: 12/11/1927 (83 y.o. Female) Treating RN: Cornell Barman Primary Care Provider: Ria Bush Other Clinician: Referring Provider: Referral, Self Treating Provider/Extender: Frann Rider in Treatment: 0 Subjective Chief Complaint Information obtained from Patient Patient presents for treatment of an open ulcer due to venous insufficiency, associated redness and swelling for about 2 weeks History of Present Illness (HPI) The following HPI elements were documented for the patient's wound: Location: swelling, redness and ulcer in the right lower extremity for 2 weeks Quality: Patient reports experiencing burning to affected area(s). Severity: Patient states wound are getting worse. Duration: Patient has had the wound for < 2 weeks prior to presenting for treatment Timing: Pain in wound is Intermittent (comes and goes Context: The wound appeared gradually over time Modifying Factors: Consults to this date include:not seen any physician recently Associated Signs and Symptoms: Patient reports having difficulty standing for long periods. 83 year old patient who is known toes from a lengthy previous consultation between May 2016 to May 2017 now returns to [place] for a swelling, redness and pain of her right lower extremity for about 2 weeks. She says she has been wearing her compression stockings and most recently has not been seen by any other physician. No other significant  changes in the recent past and no recent admissions to hospital. ========== Old notes: This is an 83 yrs old female we have been following since the beginning of January for a wound on the right medial ankle. She had previously had venous ablation of the greater saphenous vein by Dr. Kellie Simmering. She came to [place] with erythema of the right leg and subsequently is opened in the medial aspect of the right ankle. She has not been very compliant  with compression. We ordered her at juxta lite stocking, however she apparently has not been able to wear it. The area on the medial aspect of her left ankle has much less induration and erythema. The edema is better controlled. No evidence of infection is noted. 07/18/14; no major change in the condition of this wound. There is still surface slough and probably some drainage. 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 All other veins showed no evidence of DVT, superficial venous thrombosis or incompetence. however she does have manifestations of chronic venous hypertension and I believe we will change her dressing over to Prisma and a Profore light dressing. [name], [name] ([id number]) she sees her primary care doctor regularly and her last hemoglobin A1c has been around 7%.her other comorbidities are hypertension, CABG in 1998, peptic ulcer disease, history of echo with a ejection fraction of 55-60% and grade 1 diastolic dysfunction. past surgical history include coronary artery bypass graft in 1998, cholecystectomy, appendectomy, hand surgery, endovenous ablation of the saphenous vein with laser by Dr. Kellie Simmering in December 2015. 08/29/2014 - she complains of increased calf pain over the past week. removed her compression bandage and pain improved. no fever or chills. Mild clear drainage. 09/04/2014 -- she has not been compliant and has not  done a dressing change as we had ordered last week. She always complains and does not keep her wrap on and this past week she has done the same. She has completed a course of doxycycline. 09/25/2014 -- she has tolerated her dressing all week and has not removed it. She says it's hurting a lot today and would not like any debridement. We will do so as per her wishes. she did get a appointment with the vascular surgeon and sees him on 6/28. 10/03/2014 - She was seen by Dr. Tinnie Gens 09/30/2014. Assessment: I reviewed the previous venous duplex exam study performed in September 2015 which reveals no options for treating this venous ulcer. There is no significant superficial venous reflux to treat with laser ablation.Most recent ABI was 0.91 in the right foot with palpable dorsalis pedis pulse at 3+ Plan: patient needs to return to the [place] wound center and be treated by Dr. Con Memos. No arterial or venous options available from vascular standpoint or treatment of right leg stasis ulcer.Continued dressing changes and compression is the treatment of choice 10/16/2014 -- today will be her first application of Apligraf. 10/23/2014 -- we are going to be able to apply her second day of Apligraf next week. She is complaining of some tenderness in the region of Achilles tendon and this is possibly due to her wrap. 11/06/2014 -- she did not have Apligraf last week because the wound was not looking good but she says she is feeling much better now. 11/13/2014 -- she has been doing well this week and she is here for her second Apligraf. 11/27/2014 -- she is here for her third application of Apligraf 12/12/2014 -- she is here for her fourth application of Apligraf. Also during these past 3 days she had an injury to her left forearm which she caught against the edge of her desk and has a lacerated wound which has gone down to the subcutaneous tissue. 12/26/2014 -- she is here for a last/fifth application  of Apligraf. 04/10/2015 -- the patient is here for her first application of Puraply. 04/17/2015 --  the patient is here for her second application of Puraply. 04/24/2015 -- her Puraply was not ordered for today and hence he would use a collagen dressing locally. 05/01/2015 -- the patient is here for her third application of Puraply. 05/08/2015 -- the patient has been doing fine and overall has shown much improvement and is here for a routine visit 05/15/2015 -- she was admitted to the hospital with viral gastroenteritis and acute renal failure with dehydration and has been on supportive care since February 7. She was discharged today and has come for her wound care. Details of her hospital admission and therapy reviewed. 07/28/15; this is a patient I know from previous stays at the [facility] wound care center. I see she was most recently here predominantly for a venous insufficiency ulcer on the right lower medial leg. Apparently she phones our nurses on a regular basis and there was concern generated that she actually had a lower extremity wound. She arrives with a difficult to follow litany of complaints against the various compression Zentner, Tyrika C. ([id number]) garments we have supplied including I believe juxtalite stockings and 2 layer compression. She seems to have settled on wearing just one layer of the 2 layer compression. I'm really not certain at this point what her problem was with the juxtalite stockings 08/17/2015 -- she has been using some silver product on her lower extremity on the right and was concerned about that blackish discoloration. He has no open wounds. She has been wearing some compression stockings which may be the 15-20 mm type. ========== Wound History Patient presents with 3 open wounds that have been present for approximately 2 weeks. Patient has been treating wounds in the following manner: vasaline. The wounds have been healed in the past but have  re- opened. Laboratory tests have been performed in the last month. Patient reportedly has tested positive for an antibiotic resistant organism. Patient reportedly has not tested positive for osteomyelitis. Patient reportedly has had testing performed to evaluate circulation in the legs. Patient History Information obtained from Patient. Allergies No Known Allergies Family History Diabetes - Father, Siblings, Child, Hypertension - Child, Lung Disease - Child, Stroke - Mother, No family history of Cancer, Heart Disease, Kidney Disease, Seizures, Thyroid Problems. Social History Former smoker, Marital Status - Single, Alcohol Use - Never, Drug Use - No History, Caffeine Use - Daily. Medical History Eyes Denies history of Glaucoma, Optic Neuritis Hematologic/Lymphatic Denies history of Hemophilia, Human Immunodeficiency Virus, Lymphedema, Sickle Cell Disease Respiratory Denies history of Aspiration, Asthma, Chronic Obstructive Pulmonary Disease (COPD), Pneumothorax, Sleep Apnea, Tuberculosis Cardiovascular Patient has history of Arrhythmia Denies history of Angina, Congestive Heart Failure, Deep Vein Thrombosis, Hypotension, Peripheral Arterial Disease, Peripheral Venous Disease, Phlebitis Endocrine Denies history of Type I Diabetes Genitourinary Denies history of End Stage Renal Disease Immunological Denies history of Lupus Erythematosus, Raynaud s, Scleroderma Integumentary (Skin) Denies history of History of Burn, History of pressure wounds Milam, Kawana C. ([id number]) Musculoskeletal Denies history of Gout, Rheumatoid Arthritis, Osteomyelitis Patient is treated with Oral Agents. Blood sugar is not tested. Medical And Surgical History Notes Constitutional Symptoms (General Health) High Blood Pressure, open heart surgery, gall stone, hysterectomy; Fluid pill, Anxiety Review of Systems (ROS) Eyes The patient has no complaints or symptoms. Hematologic/Lymphatic The patient  has no complaints or symptoms. Respiratory The patient has no complaints or symptoms. Endocrine The patient has no complaints or symptoms. Genitourinary The patient has no complaints or symptoms. Immunological The patient has no complaints or symptoms. Integumentary (Skin)  Complains or has symptoms of Wounds, Bleeding or bruising tendency. Denies complaints or symptoms of Breakdown, Swelling. Musculoskeletal The patient has no complaints or symptoms. Neurologic The patient has no complaints or symptoms. Oncologic The patient has no complaints or symptoms. Medications doxycycline hyclate 100 mg capsule oral capsule oral bid Objective Constitutional Pulse regular. Respirations normal and unlabored. Afebrile. Bowley, Deaunna C. ([id number]) Vitals Time Taken: 12:51 PM, Height: 58 in, Source: Measured, Weight: 148 lbs, Source: Measured, BMI: 30.9, Temperature: 98.1 F, Pulse: 70 bpm, Respiratory Rate: 16 breaths/min, Blood Pressure: 136/90 mmHg. Eyes Nonicteric. Reactive to light. Ears, Nose, Mouth, and Throat Lips, teeth, and gums WNL.Marland Kitchen Moist mucosa without lesions. Neck supple and nontender. No palpable supraclavicular or cervical adenopathy. Normal sized without goiter. Respiratory WNL. No retractions.. Breath sounds WNL, No rubs, rales, rhonchi, or wheeze.. Cardiovascular Pedal Pulses WNL. ABIs were nonobtainable. she has a swelling, redness and tenderness of her right lower extremity. Chest Breasts symmetical and no nipple discharge.. Breast tissue WNL, no masses, lumps, or tenderness.. Gastrointestinal (GI) Abdomen without masses or tenderness.. No liver or spleen enlargement or tenderness.. Lymphatic No adneopathy. No adenopathy. No adenopathy. Musculoskeletal Adexa without tenderness or enlargement.. Digits and nails w/o clubbing, cyanosis, infection, petechiae, ischemia, or inflammatory conditions.Marland Kitchen Psychiatric Judgement and insight Intact.. No evidence of  depression, anxiety, or agitation.. General Notes: her right lower extremity is red, tender and swollen and there is minimal ulceration on the right lower third of the leg with minimal drainage of fluid. No debridement was required Integumentary (Hair, Skin) No suspicious lesions. No crepitus or fluctuance. No peri-wound warmth or erythema. No masses.. Wound #4 status is Open. Original cause of wound was Gradually Appeared. The wound is located on the Right,Lateral Lower Leg. The wound measures 1cm length x 0.5cm width x 0.1cm depth; 0.393cm^2 area and 0.039cm^3 volume. The wound is limited to skin breakdown. There is no tunneling or undermining noted. There is a large amount of serous drainage noted. The wound margin is indistinct and nonvisible. There is no granulation within the wound bed. There is a large (67-100%) amount of necrotic tissue within the wound bed including Adherent Slough. The periwound skin appearance exhibited: Maceration. The periwound skin appearance did not exhibit: Callus, Crepitus, Excoriation, Induration, Rash, Scarring, Macintyre, Anayi C. ([id number]) Dry/Scaly, Atrophie Blanche, Cyanosis, Ecchymosis, Hemosiderin Staining, Mottled, Pallor, Rubor, Erythema. Wound #5 status is Open. Original cause of wound was Gradually Appeared. The wound is located on the Right,Medial Lower Leg. The wound measures 0.1cm length x 0.1cm width x 0.1cm depth; 0.008cm^2 area and 0.001cm^3 volume. There is no tunneling or undermining noted. There is a large amount of serous drainage noted. The wound margin is indistinct and nonvisible. There is no granulation within the wound bed. There is no necrotic tissue within the wound bed. The periwound skin appearance exhibited: Maceration. The periwound skin appearance did not exhibit: Callus, Crepitus, Excoriation, Induration, Rash, Scarring, Dry/Scaly, Atrophie Blanche, Cyanosis, Ecchymosis, Hemosiderin Staining, Mottled, Pallor, Rubor,  Erythema. Assessment Active Problems ICD-10 E11.622 - Type 2 diabetes mellitus with other skin ulcer I87.331 - Chronic venous hypertension (idiopathic) with ulcer and inflammation of right lower extremity I82.401 - Acute embolism and thrombosis of unspecified deep veins of right lower extremity L03.115 - Cellulitis of right lower limb this 83 year old patient who is known to have chronic venous hypertension in the past and was treated with compression stockings now returns with problems in the right lower extremity which have a differential diagnosis of either a cellulitis of the right  lower extremity or a deep vein thrombosis. After review today I have recommended: 1. A DVT study of the right lower extremity as soon as possible 2. If this is positive she needs to go to the ER 3. If it is negative she will be started on oral doxycycline 100 mg twice a day for 2 weeks 4. She will keep a close watch on her legs and if she develops systemic symptoms she will go to the ER as soon as possible 5. She can continue elevation and exercise and mild compression if the DVT study is negative All the discussion is had with the patient and her son was at the bedside and he understands and will be compliant Plan Wound Cleansing: Wound #4 Right,Lateral Lower Leg: Darnell, Savreen C. ([id number]) Clean wound with Normal Saline. Wound #5 Right,Medial Lower Leg: Clean wound with Normal Saline. Anesthetic: Wound #4 Right,Lateral Lower Leg: Topical Lidocaine 4% cream applied to wound bed prior to debridement Wound #5 Right,Medial Lower Leg: Topical Lidocaine 4% cream applied to wound bed prior to debridement Primary Wound Dressing: Wound #4 Right,Lateral Lower Leg: Aquacel Ag Wound #5 Right,Medial Lower Leg: Aquacel Ag Secondary Dressing: Wound #4 Right,Lateral Lower Leg: ABD pad Conform/Kerlix Wound #5 Right,Medial Lower Leg: ABD pad Conform/Kerlix Dressing Change Frequency: Wound #4  Right,Lateral Lower Leg: Change dressing every other day. Wound #5 Right,Medial Lower Leg: Change dressing every other day. Follow-up Appointments: Wound #4 Right,Lateral Lower Leg: Return Appointment in 1 week. Wound #5 Right,Medial Lower Leg: Return Appointment in 1 week. Edema Control: Wound #4 Right,Lateral Lower Leg: Elevate legs to the level of the heart and pump ankles as often as possible Wound #5 Right,Medial Lower Leg: Elevate legs to the level of the heart and pump ankles as often as possible Additional Orders / Instructions: Wound #4 Right,Lateral Lower Leg: Other: - Stay off leg for the next few days. Wound #5 Right,Medial Lower Leg: Other: - Stay off leg for the next few days. Medications-please add to medication list.: Wound #4 Right,Lateral Lower Leg: P.O. Antibiotics Wound #5 Right,Medial Lower Leg: P.O. Antibiotics ordered were: DVT US The following medication(s) was prescribed: doxycycline hyclate oral 100 mg capsule capsule oral bid starting 12/12/2016 [name], [name] ([id number]) this 83 year old patient who is known to have chronic venous hypertension in the past and was treated with compression stockings now returns with problems in the right lower extremity which have a differential diagnosis of either a cellulitis of the right lower extremity or a deep vein thrombosis. After review today I have recommended: 1. A DVT study of the right lower extremity as soon as possible 2. If this is positive she needs to go to the ER 3. If it is negative she will be started on oral doxycycline 100 mg twice a day for 2 weeks 4. She will keep a close watch on her legs and if she develops systemic symptoms she will go to the ER as soon as possible 5. She can continue elevation and exercise and mild compression if the DVT study is negative All the discussion is had with the patient and her son was at the bedside and he understands and will be compliant. Electronic  Signature(s) Signed: 12/14/2016 7:54:52 AM By: Christin Fudge MD, FACS Previous Signature: 12/12/2016 4:10:50 PM Version By: Christin Fudge MD, FACS Entered By: Christin Fudge on 12/14/2016 07:54:06 Scaff, [name] ([id number]) -------------------------------------------------------------------------------- ROS/PFSH Details Patient Name: [name], [name] Date of Service: 12/12/2016 12:45 PM Medical Record Number: [id number] Patient Account Number:  [id number] Date of Birth/Sex: July 16, 1927 (83 y.o. Female) Treating RN: Cornell Barman Primary Care Provider: Ria Bush Other Clinician: Referring Provider: Referral, Self Treating Provider/Extender: Frann Rider in Treatment: 0 Information Obtained From Patient Wound History Do you currently have one or more open woundso Yes How many open wounds do you currently haveo 3 Approximately how long have you had your woundso 2 weeks How have you been treating your wound(s) until nowo vasaline Has your wound(s) ever healed and then re-openedo Yes Have you had any lab work done in the past montho Yes Have you tested positive for an antibiotic resistant organism (MRSA, VRE)o Yes Have you tested positive for osteomyelitis (bone infection)o No Have you had any tests for circulation on your legso Yes Integumentary (Skin) Complaints and Symptoms: Positive for: Wounds; Bleeding or bruising tendency Negative for: Breakdown; Swelling Medical History: Negative for: History of Burn; History of pressure wounds Constitutional Symptoms (General Health) Medical History: Past Medical History Notes: High Blood Pressure, open heart surgery, gall stone, hysterectomy; Fluid pill, Anxiety Eyes Complaints and Symptoms: No Complaints or Symptoms Medical History: Positive for: Cataracts - removed Negative for: Glaucoma; Optic Neuritis Hematologic/Lymphatic Complaints and Symptoms: No Complaints or Symptoms Reposa, Genieve C. ([id number]) Medical  History: Positive for: Anemia Negative for: Hemophilia; Human Immunodeficiency Virus; Lymphedema; Sickle Cell Disease Respiratory Complaints and Symptoms: No Complaints or Symptoms Medical History: Negative for: Aspiration; Asthma; Chronic Obstructive Pulmonary Disease (COPD); Pneumothorax; Sleep Apnea; Tuberculosis Cardiovascular Medical History: Positive for: Arrhythmia; Coronary Artery Disease; Hypertension; Myocardial Infarction Negative for: Angina; Congestive Heart Failure; Deep Vein Thrombosis; Hypotension; Peripheral Arterial Disease; Peripheral Venous Disease; Phlebitis Endocrine Complaints and Symptoms: No Complaints or Symptoms Medical History: Positive for: Type II Diabetes Negative for: Type I Diabetes Time with diabetes: no idea Treated with: Oral agents Blood sugar tested every day: No Genitourinary Complaints and Symptoms: No Complaints or Symptoms Medical History: Negative for: End Stage Renal Disease Immunological Complaints and Symptoms: No Complaints or Symptoms Medical History: Negative for: Lupus Erythematosus; Raynaudos; Scleroderma Musculoskeletal [name], [name] ([id number]) Complaints and Symptoms: No Complaints or Symptoms Medical History: Positive for: Osteoarthritis Negative for: Gout; Rheumatoid Arthritis; Osteomyelitis Neurologic Complaints and Symptoms: No Complaints or Symptoms Medical History: Negative for: Dementia; Neuropathy; Quadriplegia; Paraplegia; Seizure Disorder Oncologic Complaints and Symptoms: No Complaints or Symptoms Medical History: Negative for: Received Chemotherapy; Received Radiation HBO Extended History Items Eyes: Cataracts Immunizations Pneumococcal Vaccine: Received Pneumococcal Vaccination: No Family and Social History Cancer: No; Diabetes: Yes - Father, Siblings, Child; Heart Disease: No; Hypertension: Yes - Child; Kidney Disease: No; Lung Disease: Yes - Child; Seizures: No; Stroke: Yes - Mother;  Thyroid Problems: No; Former smoker; Marital Status - Single; Alcohol Use: Never; Drug Use: No History; Caffeine Use: Daily; Financial Concerns: No; Food, Clothing or Shelter Needs: No; Support System Lacking: No; Transportation Concerns: No; Advanced Directives: Yes (Not Provided); Patient does not want information on Advanced Directives; Living Will: Yes (Not Provided) Physician Affirmation I have reviewed and agree with the above information. Electronic Signature(s) Signed: 12/12/2016 4:10:50 PM By: Christin Fudge MD, FACS Signed: 12/13/2016 8:10:04 AM By: Gretta Cool BSN, RN, CWS, Kim RN, BSN Entered By: Christin Fudge on 12/12/2016 13:30:31 [name], [name] ([id number]) -------------------------------------------------------------------------------- SuperBill Details Patient Name: [name], [name] Date of Service: 12/12/2016 Medical Record Number: [id number] Patient Account Number: [id number] Date of Birth/Sex: 1927/06/28 (83 y.o. Female) Treating RN: Cornell Barman Primary Care Provider: Ria Bush Other Clinician: Referring Provider: Referral, Self Treating Provider/Extender: Frann Rider in Treatment: 0 Diagnosis Coding ICD-10  Codes Code Description E11.622 Type 2 diabetes mellitus with other skin ulcer Chronic venous hypertension (idiopathic) with ulcer and inflammation of right lower I87.331 extremity I82.401 Acute embolism and thrombosis of unspecified deep veins of right lower extremity L03.115 Cellulitis of right lower limb Facility Procedures CPT4 Code: [id number] Description: 99214 - WOUND CARE VISIT-LEV 4 EST PT Modifier: Quantity: 1 Physician Procedures CPT4: Description Modifier Quantity Code [id number] - WC PHYS LEVEL 4 - EST PT 1 ICD-10 Description Diagnosis E11.622 Type 2 diabetes mellitus with other skin ulcer I87.331 Chronic venous hypertension (idiopathic) with ulcer and inflammation of right  lower extremity I82.401 Acute embolism and thrombosis of  unspecified deep veins of right lower extremity L03.115 Cellulitis of right lower limb Electronic Signature(s) Signed: 12/14/2016 7:54:52 AM By: Christin Fudge MD, FACS Previous Signature: 12/12/2016 4:10:50 PM Version By: Christin Fudge MD, FACS Entered By: Sharon Mt on 12/13/2016 12:00:15

## 2016-12-23 ENCOUNTER — Encounter: Payer: Medicare Other | Admitting: Surgery

## 2016-12-23 DIAGNOSIS — E11622 Type 2 diabetes mellitus with other skin ulcer: Secondary | ICD-10-CM | POA: Diagnosis not present

## 2016-12-23 DIAGNOSIS — I10 Essential (primary) hypertension: Secondary | ICD-10-CM | POA: Diagnosis not present

## 2016-12-23 DIAGNOSIS — L03115 Cellulitis of right lower limb: Secondary | ICD-10-CM | POA: Diagnosis not present

## 2016-12-23 DIAGNOSIS — L97819 Non-pressure chronic ulcer of other part of right lower leg with unspecified severity: Secondary | ICD-10-CM | POA: Diagnosis not present

## 2016-12-23 DIAGNOSIS — I82401 Acute embolism and thrombosis of unspecified deep veins of right lower extremity: Secondary | ICD-10-CM | POA: Diagnosis not present

## 2016-12-23 DIAGNOSIS — Z951 Presence of aortocoronary bypass graft: Secondary | ICD-10-CM | POA: Diagnosis not present

## 2016-12-23 DIAGNOSIS — I87331 Chronic venous hypertension (idiopathic) with ulcer and inflammation of right lower extremity: Secondary | ICD-10-CM | POA: Diagnosis not present

## 2016-12-23 DIAGNOSIS — I87311 Chronic venous hypertension (idiopathic) with ulcer of right lower extremity: Secondary | ICD-10-CM | POA: Diagnosis not present

## 2016-12-25 NOTE — Progress Notes (Signed)
[name], VOLLAND ([id number]) Visit Report for 12/23/2016 Arrival Information Details Patient Name: [name], [name] Date of Service: 12/23/2016 2:15 PM Medical Record Number: [id number] Patient Account Number: [id number] Date of Birth/Sex: [birth year] (83 y.o. Female) Treating RN: Montey Hora Primary Care Yetzali Weld: Ria Bush Other Clinician: Referring Steel Kerney: Ria Bush Treating Seville Brick/Extender: Frann Rider in Treatment: 1 Visit Information History Since Last Visit Added or deleted any medications: No Patient Arrived: Walker Any new allergies or adverse reactions: No Arrival Time: 14:29 Had a fall or experienced change in No Accompanied By: son, [name] activities of daily living that may affect Transfer Assistance: None risk of falls: Patient Identification Verified: Yes Signs or symptoms of abuse/neglect since last No Secondary Verification Process Yes visito Completed: Hospitalized since last visit: No Patient Has Alerts: Yes Pain Present Now: Yes Patient Alerts: Patient on Blood Thinner 81mg  aspirin Electronic Signature(s) Signed: 12/23/2016 5:20:28 PM By: Montey Hora Entered By: Montey Hora on 12/23/2016 14:32:11 Jeanpaul, [name] ([id number]) -------------------------------------------------------------------------------- Clinic Level of Care Assessment Details Patient Name: [name], [name] Date of Service: 12/23/2016 2:15 PM Medical Record Number: [id number] Patient Account Number: [id number] Date of Birth/Sex: 11-21-1927 (83 y.o. Female) Treating RN: Montey Hora Primary Care Neri Samek: Ria Bush Other Clinician: Referring Martel Galvan: Ria Bush Treating Chynah Orihuela/Extender: Frann Rider in Treatment: 1 Clinic Level of Care Assessment Items TOOL 4 Quantity Score []  - Use when only an EandM is performed on FOLLOW-UP visit 0 ASSESSMENTS - Nursing Assessment / Reassessment X - Reassessment of Co-morbidities  (includes updates in patient status) 1 10 X - Reassessment of Adherence to Treatment Plan 1 5 ASSESSMENTS - Wound and Skin Assessment / Reassessment []  - Simple Wound Assessment / Reassessment - one wound 0 X - Complex Wound Assessment / Reassessment - multiple wounds 2 5 []  - Dermatologic / Skin Assessment (not related to wound area) 0 ASSESSMENTS - Focused Assessment []  - Circumferential Edema Measurements - multi extremities 0 []  - Nutritional Assessment / Counseling / Intervention 0 X - Lower Extremity Assessment (monofilament, tuning fork, pulses) 1 5 []  - Peripheral Arterial Disease Assessment (using hand held doppler) 0 ASSESSMENTS - Ostomy and/or Continence Assessment and Care []  - Incontinence Assessment and Management 0 []  - Ostomy Care Assessment and Management (repouching, etc.) 0 PROCESS - Coordination of Care X - Simple Patient / Family Education for ongoing care 1 15 []  - Complex (extensive) Patient / Family Education for ongoing care 0 []  - Staff obtains Programmer, systems, Records, Test Results / Process Orders 0 []  - Staff telephones HHA, Nursing Homes / Clarify orders / etc 0 []  - Routine Transfer to another Facility (non-emergent condition) 0 Closson, Zafiro C. ([id number]) []  - Routine Hospital Admission (non-emergent condition) 0 []  - New Admissions / Biomedical engineer / Ordering NPWT, Apligraf, etc. 0 []  - Emergency Hospital Admission (emergent condition) 0 X - Simple Discharge Coordination 1 10 []  - Complex (extensive) Discharge Coordination 0 PROCESS - Special Needs []  - Pediatric / Minor Patient Management 0 []  - Isolation Patient Management 0 []  - Hearing / Language / Visual special needs 0 []  - Assessment of Community assistance (transportation, D/C planning, etc.) 0 []  - Additional assistance / Altered mentation 0 []  - Support Surface(s) Assessment (bed, cushion, seat, etc.) 0 INTERVENTIONS - Wound Cleansing / Measurement []  - Simple Wound Cleansing - one  wound 0 X - Complex Wound Cleansing - multiple wounds 2 5 X - Wound Imaging (photographs - any number of wounds) 1 5 []  - Wound Tracing (instead  of photographs) 0 []  - Simple Wound Measurement - one wound 0 X - Complex Wound Measurement - multiple wounds 2 5 INTERVENTIONS - Wound Dressings []  - Small Wound Dressing one or multiple wounds 0 []  - Medium Wound Dressing one or multiple wounds 0 X - Large Wound Dressing one or multiple wounds 1 20 []  - Application of Medications - topical 0 []  - Application of Medications - injection 0 INTERVENTIONS - Miscellaneous []  - External ear exam 0 Laatsch, Aditi C. ([id number]) []  - Specimen Collection (cultures, biopsies, blood, body fluids, etc.) 0 []  - Specimen(s) / Culture(s) sent or taken to Lab for analysis 0 []  - Patient Transfer (multiple staff / Harrel Lemon Lift / Similar devices) 0 []  - Simple Staple / Suture removal (25 or less) 0 []  - Complex Staple / Suture removal (26 or more) 0 []  - Hypo / Hyperglycemic Management (close monitor of Blood Glucose) 0 []  - Ankle / Brachial Index (ABI) - do not check if billed separately 0 X - Vital Signs 1 5 Has the patient been seen at the hospital within the last three years: Yes Total Score: 105 Level Of Care: New/Established - Level 3 Electronic Signature(s) Signed: 12/23/2016 5:20:28 PM By: Montey Hora Entered By: Montey Hora on 12/23/2016 16:50:56 Matarazzo, [name] ([id number]) -------------------------------------------------------------------------------- Encounter Discharge Information Details Patient Name: [name], [name] Date of Service: 12/23/2016 2:15 PM Medical Record Number: [id number] Patient Account Number: [id number] Date of Birth/Sex: 06-Nov-1927 (83 y.o. Female) Treating RN: Primary Care Katherene Dinino: Ria Bush Other Clinician: Referring Mccartney Chuba: Ria Bush Treating Kenijah Benningfield/Extender: Frann Rider in Treatment: 1 Encounter Discharge Information  Items Discharge Pain Level: 0 Discharge Condition: Stable Ambulatory Status: Walker Discharge Destination: Home Transportation: Private Auto Accompanied By: self Schedule Follow-up Appointment: Yes Medication Reconciliation completed No and provided to Patient/Care Taran Haynesworth: Provided on Clinical Summary of Care: 12/23/2016 Form Type Recipient Paper Patient LL Electronic Signature(s) Signed: 12/23/2016 5:20:28 PM By: Montey Hora Entered By: Montey Hora on 12/23/2016 16:52:14 Doebler, [name] ([id number]) -------------------------------------------------------------------------------- Lower Extremity Assessment Details Patient Name: [name], [name] Date of Service: 12/23/2016 2:15 PM Medical Record Number: [id number] Patient Account Number: [id number] Date of Birth/Sex: 1928-02-19 (83 y.o. Female) Treating RN: Montey Hora Primary Care Pollyanna Levay: Ria Bush Other Clinician: Referring Tomesha Sargent: Ria Bush Treating [place] Huberty/Extender: Frann Rider in Treatment: 1 Edema Assessment Assessed: [Left: No] [Right: No] E[Left: dema] [Right: :] Calf Left: Right: Point of Measurement: 28 cm From Medial Instep 28 cm 27.8 cm Ankle Left: Right: Point of Measurement: 9 cm From Medial Instep 18.4 cm 19.3 cm Vascular Assessment Pulses: Dorsalis Pedis Palpable: [Left:Yes] [Right:Yes] Posterior Tibial Extremity colors, hair growth, and conditions: Extremity Color: [Left:Mottled] [Right:Mottled] Hair Growth on Extremity: [Left:No] [Right:No] Temperature of Extremity: [Left:Warm] [Right:Warm] Capillary Refill: [Left:< 3 seconds] [Right:< 3 seconds] Electronic Signature(s) Signed: 12/23/2016 5:20:28 PM By: Montey Hora Entered By: Montey Hora on 12/23/2016 14:45:51 Lansberry, Stefanee C. ([id number]) -------------------------------------------------------------------------------- Multi Wound Chart Details Patient Name: [name], [name] Date of Service:  12/23/2016 2:15 PM Medical Record Number: [id number] Patient Account Number: [id number] Date of Birth/Sex: [birth year] (83 y.o. 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] [5:Diabetic Wound/Ulcer of the Lower Extremity] [N/A:N/A] Comorbid History: [4:Cataracts, Anemia, Arrhythmia, Coronary Artery Disease, Hypertension, Myocardial Infarction, Type II Diabetes, Osteoarthritis] [5:Cataracts, Anemia, Arrhythmia, Coronary Artery Disease, Hypertension, Myocardial Infarction, Type II  Diabetes, Osteoarthritis] [N/A:N/A] Date Acquired: [4:10/24/2016] [5:11/28/2016] [N/A:N/A] Weeks of Treatment: [4:1] [5:1] [N/A:N/A] Wound Status: [4:Open] [5:Healed - Epithelialized] [N/A:N/A] Measurements L x W x D 0.7x0.5x0.1 [5:0x0x0] [N/A:N/A] (cm) Area (cm) : [4:0.275] [5:0] [N/A:N/A] Volume (cm) : [4:0.027] [5:0] [N/A:N/A] % Reduction in Area: [4:30.00%] [5:100.00%] [N/A:N/A] % Reduction in Volume: 30.80% [5:100.00%] [N/A:N/A] Classification: [4:Grade 1] [5:Grade 0] [N/A:N/A] Exudate Amount: [4:Large] [5:None Present] [N/A:N/A] Exudate Type: [4:Serous] [5:N/A] [N/A:N/A] Exudate Color: [4:amber] [5:N/A] [N/A:N/A] Wound Margin: [4:Indistinct, nonvisible] [5:Indistinct, nonvisible] [N/A:N/A] Granulation Amount: [4:None Present (0%)] [5:None Present (0%)] [N/A:N/A] Necrotic Amount: [4:Large (67-100%)] [5:None Present  (0%)] [N/A:N/A] Exposed Structures: [4:Fascia: No Fat Layer (Subcutaneous Tissue) Exposed: No] [5:Fascia: No Fat Layer (Subcutaneous Tissue) Exposed: No] [N/A:N/A] Tendon: No Tendon: No Muscle: No Muscle: No Joint: No Joint: No Bone: No Bone: No Limited to Skin Breakdown Epithelialization: None Large (67-100%) N/A Periwound Skin Texture: Excoriation: No Excoriation: No N/A Induration: No Induration: No Callus: No Callus: No Crepitus: No Crepitus: No Rash: No Rash: No Scarring: No Scarring: No Periwound Skin Maceration: Yes Maceration: Yes N/A Moisture: Dry/Scaly: No Dry/Scaly: No Periwound Skin Color: Atrophie Blanche: No Atrophie Blanche: No N/A Cyanosis: No Cyanosis: No Ecchymosis: No Ecchymosis: No Erythema: No Erythema: No Hemosiderin Staining: No Hemosiderin Staining: No Mottled: No Mottled: No Pallor: No Pallor: No Rubor: No Rubor: No Tenderness on No No N/A Palpation: Wound Preparation: Ulcer Cleansing: Ulcer Cleansing: N/A Rinsed/Irrigated with Rinsed/Irrigated with Saline Saline Topical Anesthetic Topical Anesthetic Applied: Other: lidocaine Applied: None 4% Treatment Notes Electronic Signature(s) Signed: 12/23/2016 4:09:30 PM By: Christin Fudge MD, FACS Entered By: Christin Fudge on 12/23/2016 14:59:07 [place]ton, [name] ([id number]) -------------------------------------------------------------------------------- [place] Details Patient Name: Beightol, Ailish C. Date of Service: 12/23/2016 2:15 PM Medical Record Number: [id number] Patient Account Number: [id number] Date of Birth/Sex: [birth year] (83 y.o. Female) Treating RN: Montey Hora Primary Care Delberta Folts: Ria Bush Other Clinician: Referring Octavius Shin: Ria Bush Treating Suda [name]/Extender: Frann Rider in Treatment: 1 Active Inactive ` Orientation to the Wound Care Program Nursing Diagnoses: Knowledge deficit related to the wound healing  center program Goals: Patient/caregiver will verbalize understanding of the [facility] Program Date Initiated: 12/12/2016 Target Resolution Date: 12/26/2016 Goal Status: Active Interventions: Provide education on orientation to the wound center Notes: ` Venous Leg Ulcer Nursing Diagnoses: Actual venous Insuffiency (use after diagnosis is confirmed) Goals: Verify adequate tissue perfusion prior to therapeutic compression application Date Initiated: 12/12/2016 Target Resolution Date: 12/12/2016 Goal Status: Active Interventions: Assess peripheral edema status every visit. Notes: ` Wound/Skin Impairment Nursing Diagnoses: Impaired tissue integrity Eggenberger, [name] ([id number]) Goals: Ulcer/skin breakdown will heal within 14 weeks Date Initiated: 12/12/2016 Target Resolution Date: 03/13/2017 Goal Status: Active Interventions: Assess ulceration(s) every visit Notes: Electronic Signature(s) Signed: 12/23/2016 5:20:28 PM By: Montey Hora Entered By: Montey Hora on 12/23/2016 14:45:59 Marcantonio, Saavi C. ([id number]) -------------------------------------------------------------------------------- Pain Assessment Details Patient Name: [name], [name] Date of Service: 12/23/2016 2:15 PM Medical Record Number: [id number] Patient Account Number: [id number] Date of Birth/Sex: [birth year] (83 y.o. Female) Treating RN: Montey Hora Primary Care Devonte Migues: Ria Bush Other Clinician: Referring Mahi Zabriskie: Ria Bush Treating Lexii Walsh/Extender: Frann Rider in Treatment: 1 Active Problems Location of Pain Severity and Description of Pain Patient Has [name] Yes Site Locations Rate the pain. Current  Pain Level: 4 Worst Pain Level: 6 Least Pain Level: 0 Pain Management and Medication Current Pain Management: Electronic Signature(s) Signed: 12/23/2016 5:20:28 PM By: Montey Hora Entered By: Montey Hora on 12/23/2016 14:32:38 Stevens, [name]  ([id number]) -------------------------------------------------------------------------------- Patient/Caregiver Education Details Patient Name: [name], [name] Date of Service: 12/23/2016 2:15 PM Medical Record Number: [id number] Patient Account Number: [id number] Date of Birth/Gender: 06-22-1927 (83 y.o. Female) Treating RN: Montey Hora Primary Care Physician: Ria Bush Other Clinician: Referring Physician: Ria Bush Treating Physician/Extender: Frann Rider in Treatment: 1 Education Assessment Education Provided To: Patient Education Topics Provided Venous: Handouts: Other: leg elevation Methods: Explain/Verbal Responses: State content correctly Electronic Signature(s) Signed: 12/23/2016 5:20:28 PM By: Montey Hora Entered By: Montey Hora on 12/23/2016 16:52:29 Leidy, [name] ([id number]) -------------------------------------------------------------------------------- Wound Assessment Details Patient Name: [name], [name] Date of Service: 12/23/2016 2:15 PM Medical Record Number: [id number] Patient Account Number: [id number] Date of Birth/Sex: 1928/02/26 (83 y.o. Female) Treating RN: Montey Hora Primary Care Marabelle Cushman: Ria Bush Other Clinician: Referring Kayson Bullis: Ria Bush Treating Zaccary Creech/Extender: Frann Rider in Treatment: 1 Wound Status Wound Number: 4 Primary Diabetic Wound/Ulcer of the Lower Etiology: Extremity Wound Location: Right Lower Leg - Lateral Wound Open Wounding Event: Gradually Appeared Status: Date Acquired: 10/24/2016 Comorbid Cataracts, Anemia, Arrhythmia, Weeks Of Treatment: 1 History: Coronary Artery Disease, Hypertension, Clustered Wound: No Myocardial Infarction, Type II Diabetes, Osteoarthritis Wound Measurements Length: (cm) 0.7 Width: (cm) 0.5 Depth: (cm) 0.1 Area: (cm) 0.275 Volume: (cm) 0.027 % Reduction in Area: 30% % Reduction in Volume: 30.8% Epithelialization:  None Tunneling: No Undermining: No Wound Description Classification: Grade 1 Wound Margin: Indistinct, nonvisible Exudate Amount: Large Exudate Type: Serous Exudate Color: amber Foul Odor After Cleansing: No Slough/Fibrino Yes Wound Bed Granulation Amount: None Present (0%) Exposed Structure Necrotic Amount: Large (67-100%) Fascia Exposed: No Necrotic Quality: Adherent Slough Fat Layer (Subcutaneous Tissue) Exposed: No Tendon Exposed: No Muscle Exposed: No Joint Exposed: No Bone Exposed: No Limited to Skin Breakdown Periwound Skin Texture Texture Color No Abnormalities Noted: No No Abnormalities Noted: No Callus: No Atrophie Blanche: No Crepitus: No Cyanosis: No [place], Otila C. ([id number]) Excoriation: No Ecchymosis: No Induration: No Erythema: No Rash: No Hemosiderin Staining: No Scarring: No Mottled: No Pallor: No Moisture Rubor: No No Abnormalities Noted: No Dry / Scaly: No Maceration: Yes Wound Preparation Ulcer Cleansing: Rinsed/Irrigated with Saline Topical Anesthetic Applied: Other: lidocaine 4%, Treatment Notes Wound #4 (Right, Lateral Lower Leg) 1. Cleansed with: Clean wound with Normal Saline 2. Anesthetic Topical Lidocaine 4% cream to wound bed prior to debridement 4. Dressing Applied: Foam 5. Secondary Dressing Applied Kerlix/Conform 7. Secured with Tape Notes Event organiser) Signed: 12/23/2016 5:20:28 PM By: Montey Hora Entered By: Montey Hora on 12/23/2016 14:43:55 [place]staff, [name] ([id number]) -------------------------------------------------------------------------------- Wound Assessment Details Patient Name: [name], [name] Date of Service: 12/23/2016 2:15 PM Medical Record Number: [id number] Patient Account Number: [id number] Date of Birth/Sex: [birth year] (83 y.o. Female) Treating RN: Montey Hora Primary Care Jayra Choyce: Ria Bush Other Clinician: Referring Albertine Lafoy: Ria Bush Treating Amelita Risinger/Extender: Frann Rider in Treatment: 1 Wound Status Wound Number: 5 Primary Diabetic Wound/Ulcer of the Lower Etiology: Extremity Wound Location: Right, Medial Lower Leg Wound Healed - Epithelialized Wounding Event: Gradually Appeared Status: Date Acquired: 11/28/2016 Comorbid Cataracts, Anemia, Arrhythmia, Weeks Of Treatment: 1 History: Coronary Artery Disease, Hypertension, Clustered Wound: No Myocardial Infarction, Type II Diabetes, Osteoarthritis Wound Measurements Length: (cm) 0 % Reduction Width: (cm) 0 % Reduction Depth: (cm) 0 Epithelializ Area: (cm) 0 Tunneling: Volume: (  cm) 0 Undermining in Area: 100% in Volume: 100% ation: Large (67-100%) No : No Wound Description Classification: Grade 0 Wound Margin: Indistinct, nonvisible Exudate Amount: None Present Foul Odor After Cleansing: No Slough/Fibrino No Wound Bed Granulation Amount: None Present (0%) Exposed Structure Necrotic Amount: None Present (0%) Fascia Exposed: No Fat Layer (Subcutaneous Tissue) Exposed: No Tendon Exposed: No Muscle Exposed: No Joint Exposed: No Bone Exposed: No Periwound Skin Texture Texture Color No Abnormalities Noted: No No Abnormalities Noted: No Callus: No Atrophie Blanche: No Crepitus: No Cyanosis: No Excoriation: No Ecchymosis: No Induration: No Erythema: No Rash: No Hemosiderin Staining: No Roache, Carita C. ([id number]) Scarring: No Mottled: No Pallor: No Moisture Rubor: No No Abnormalities Noted: No Dry / Scaly: No Maceration: Yes Wound Preparation Ulcer Cleansing: Rinsed/Irrigated with Saline Topical Anesthetic Applied: None Electronic Signature(s) Signed: 12/23/2016 5:20:28 PM By: Montey Hora Entered By: Montey Hora on 12/23/2016 14:53:45 Bayles, [name] ([id number]) -------------------------------------------------------------------------------- Vitals Details Patient Name: [name], [name] Date of  Service: 12/23/2016 2:15 PM Medical Record Number: [id number] Patient Account Number: [id number] Date of Birth/Sex: 12-31-1927 (83 y.o. Female) Treating RN: Montey Hora Primary Care Mikalyn Hermida: Ria Bush Other Clinician: Referring Anona Giovannini: Ria Bush Treating Renner Sebald/Extender: Frann Rider in Treatment: 1 Vital Signs Time Taken: 14:30 Temperature (F): 98.1 Height (in): 58 Pulse (bpm): 55 Weight (lbs): 148 Respiratory Rate (breaths/min): 16 Body Mass Index (BMI): 30.9 Blood Pressure (mmHg): 135/63 Reference Range: 80 - 120 mg / dl Electronic Signature(s) Signed: 12/23/2016 5:20:28 PM By: Montey Hora Entered By: Montey Hora on 12/23/2016 14:33:17

## 2016-12-25 NOTE — Progress Notes (Signed)
STOREY, STANGELAND ([id number]) Visit Report for 12/23/2016 Chief Complaint Document Details Patient Name: [name], [name] Date of Service: 12/23/2016 2:15 PM Medical Record Number: [id number] Patient Account Number: [id number] Date of Birth/Sex: 1927-06-07 (83 y.o. Female) Treating RN: Primary Care Provider: Ria Bush Other Clinician: Referring Provider: Ria Bush Treating Provider/Extender: Frann Rider in Treatment: 1 Information Obtained from: Patient Chief Complaint Patient presents for treatment of an open ulcer due to venous insufficiency, associated redness and swelling for about 2 weeks Electronic Signature(s) Signed: 12/23/2016 4:09:30 PM By: Christin Fudge MD, FACS Entered By: Christin Fudge on 12/23/2016 14:59:19 [place], [name] ([id number]) -------------------------------------------------------------------------------- HPI Details Patient Name: [name], [name] Date of Service: 12/23/2016 2:15 PM Medical Record Number: [id number] Patient Account Number: [id number] Date of Birth/Sex: 09-Dec-1927 (83 y.o. Female) Treating RN: Primary Care Provider: Ria Bush Other Clinician: Referring Provider: Ria Bush Treating Provider/Extender: Frann Rider in Treatment: 1 History of Present Illness Location: swelling, redness and ulcer in the right lower extremity for 2 weeks Quality: Patient reports experiencing burning to affected area(s). Severity: Patient states wound are getting worse. Duration: Patient has had the wound for < 2 weeks prior to presenting for treatment Timing: Pain in wound is Intermittent (comes and goes Context: The wound appeared gradually over time Modifying Factors: Consults to this date include:not seen any physician recently Associated Signs and Symptoms: Patient reports having difficulty standing for long periods. HPI Description: 83 year old patient who is known toes from a lengthy previous consultation  between May 2016 to May 2017 now returns to [place] for a swelling, redness and pain of her right lower extremity for about 2 weeks. She says she has been wearing her compression stockings and most recently has not been seen by any other physician. No other significant changes in the recent past and no recent admissions to hospital. 12/23/2016 -- -- had a DVT study done today -- IMPRESSION:No evidence of DVT within the right lower extremity. ========== Old notes: This is an 83 yrs old female we have been following since the beginning of January for a wound on the right medial ankle. She had previously had venous ablation of the greater saphenous vein by Dr. Kellie Simmering. She came to [place] with erythema of the right leg and subsequently is opened in the medial aspect of the right ankle. She has not been very compliant with compression. We ordered her at juxta lite stocking, however she apparently has not been able to wear it. The area on the medial aspect of her left ankle has much less induration and erythema. The edema is better controlled. No evidence of infection is noted. 07/18/14; no major change in the condition of this wound. There is still surface slough and probably some drainage. 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 All other veins showed no evidence of DVT, superficial venous thrombosis or incompetence. however she does have manifestations of chronic venous hypertension and I believe we will change her dressing over to Prisma and a Profore light dressing. she sees her primary care doctor regularly and her last hemoglobin A1c has been around 7%.her other comorbidities are hypertension, CABG in 1998, peptic ulcer disease, history of echo with a ejection fraction of 55-60% and grade 1 diastolic dysfunction. past surgical history include coronary artery bypass graft in 1998,  cholecystectomy, appendectomy, hand surgery, endovenous ablation of the saphenous vein with laser  by Dr. Kellie Simmering in December 2015. BRISA, AUTH ([id number]) 08/29/2014 - she complains of increased calf pain over the past week. removed her compression bandage and pain improved. no fever or chills. Mild clear drainage. 09/04/2014 -- she has not been compliant and has not done a dressing change as we had ordered last week. She always complains and does not keep her wrap on and this past week she has done the same. She has completed a course of doxycycline. 09/25/2014 -- she has tolerated her dressing all week and has not removed it. She says it's hurting a lot today and would not like any debridement. We will do so as per her wishes. she did get a appointment with the vascular surgeon and sees him on 6/28. 10/03/2014 - She was seen by Dr. Tinnie Gens 09/30/2014. Assessment: I reviewed the previous venous duplex exam study performed in September 2015 which reveals no options for treating this venous ulcer. There is no significant superficial venous reflux to treat with laser ablation.Most recent ABI was 0.91 in the right foot with palpable dorsalis pedis pulse at 3+ Plan: patient needs to return to the [place] wound center and be treated by Dr. Con Memos. No arterial or venous options available from vascular standpoint or treatment of right leg stasis ulcer.Continued dressing changes and compression is the treatment of choice 10/16/2014 -- today will be her first application of Apligraf. 10/23/2014 -- we are going to be able to apply her second day of Apligraf next week. She is complaining of some tenderness in the region of Achilles tendon and this is possibly due to her wrap. 11/06/2014 -- she did not have Apligraf last week because the wound was not looking good but she says she is feeling much better now. 11/13/2014 -- she has been doing well this week and she is here for her second  Apligraf. 11/27/2014 -- she is here for her third application of Apligraf 12/12/2014 -- she is here for her fourth application of Apligraf. Also during these past 3 days she had an injury to her left forearm which she caught against the edge of her desk and has a lacerated wound which has gone down to the subcutaneous tissue. 12/26/2014 -- she is here for a last/fifth application of Apligraf. 04/10/2015 -- the patient is here for her first application of Puraply. 04/17/2015 -- the patient is here for her second application of Puraply. 04/24/2015 -- her Puraply was not ordered for today and hence he would use a collagen dressing locally. 05/01/2015 -- the patient is here for her third application of Puraply. 05/08/2015 -- the patient has been doing fine and overall has shown much improvement and is here for a routine visit 05/15/2015 -- she was admitted to the hospital with viral gastroenteritis and acute renal failure with dehydration and has been on supportive care since February 7. She was discharged today and has come for her wound care. Details of her hospital admission and therapy reviewed. 07/28/15; this is a patient I know from previous stays at the [facility] wound care center. I see she was most recently here predominantly for a venous insufficiency ulcer on the right lower medial leg. Apparently she phones our nurses on a regular basis and there was concern generated that she actually had a lower extremity wound. She arrives with a difficult to follow litany of complaints against the various compression garments we have supplied including I believe juxtalite stockings and 2 layer compression. She seems to have settled on  wearing just one layer of the 2 layer compression. I'm really not certain at this point what her problem was with the juxtalite stockings 08/17/2015 -- she has been using some silver product on her lower extremity on the right and was concerned about that blackish  discoloration. He has no open wounds. She has been wearing some Karapetyan, Agueda C. ([id number]) compression stockings which may be the 15-20 mm type. ========== Electronic Signature(s) Signed: 12/23/2016 4:09:30 PM By: Christin Fudge MD, FACS Entered By: Christin Fudge on 12/23/2016 14:59:24 [place], [name] ([id number]) -------------------------------------------------------------------------------- Physical Exam Details Patient Name: [name], [name] Date of Service: 12/23/2016 2:15 PM Medical Record Number: [id number] Patient Account Number: [id number] Date of Birth/Sex: February 14, 1928 (83 y.o. Female) Treating RN: Primary Care Provider: Ria Bush Other Clinician: Referring Provider: Ria Bush Treating Provider/Extender: Frann Rider in Treatment: 1 Constitutional . Pulse regular. Respirations normal and unlabored. Afebrile. . Eyes Nonicteric. Reactive to light. Ears, Nose, Mouth, and Throat Lips, teeth, and gums WNL.Marland Kitchen Moist mucosa without lesions. Neck supple and nontender. No palpable supraclavicular or cervical adenopathy. Normal sized without goiter. Respiratory WNL. No retractions.. Cardiovascular Pedal Pulses WNL. No clubbing, cyanosis or edema. Lymphatic No adneopathy. No adenopathy. No adenopathy. Musculoskeletal Adexa without tenderness or enlargement.. Digits and nails w/o clubbing, cyanosis, infection, petechiae, ischemia, or inflammatory conditions.. Integumentary (Hair, Skin) No suspicious lesions. No crepitus or fluctuance. No peri-wound warmth or erythema. No masses.Marland Kitchen Psychiatric Judgement and insight Intact.. No evidence of depression, anxiety, or agitation.. Notes the right lower extremity is looking very good today with no evidence of significant lymphedema or cellulitis and there is a small eschar on the right lateral leg but no evidence of any open ulceration. Electronic Signature(s) Signed: 12/23/2016 4:09:30 PM By: Christin Fudge MD,  FACS Entered By: Christin Fudge on 12/23/2016 15:00:15 [place], [name] ([id number]) -------------------------------------------------------------------------------- Physician Orders Details Patient Name: Snelson, Alannah C. Date of Service: 12/23/2016 2:15 PM Medical Record Number: [id number] Patient Account Number: [id number] Date of Birth/Sex: 01-24-1928 (83 y.o. Female) Treating RN: Montey Hora Primary Care Provider: Ria Bush Other Clinician: Referring Provider: Ria Bush Treating Provider/Extender: Frann Rider in Treatment: 1 Verbal / Phone Orders: No Diagnosis Coding Wound Cleansing Wound #4 Right,Lateral Lower Leg o Clean wound with Normal Saline. Anesthetic Wound #4 Right,Lateral Lower Leg o Topical Lidocaine 4% cream applied to wound bed prior to debridement Primary Wound Dressing Wound #4 Right,Lateral Lower Leg o Foam Secondary Dressing Wound #4 Right,Lateral Lower Leg o ABD pad o Kerlix and Coban Dressing Change Frequency Wound #4 Right,Lateral Lower Leg o Change dressing every week Follow-up Appointments Wound #4 Right,Lateral Lower Leg o Return Appointment in 1 week. Edema Control Wound #4 Right,Lateral Lower Leg o Elevate legs to the level of the heart and pump ankles as often as possible Additional Orders / Instructions Wound #4 Right,Lateral Lower Leg o Activity as tolerated Sandles, Takeila CMarland Kitchen ([id number]) Electronic Signature(s) Signed: 12/23/2016 4:09:30 PM By: Christin Fudge MD, FACS Signed: 12/23/2016 5:20:28 PM By: Montey Hora Entered By: Montey Hora on 12/23/2016 14:54:54 Bagger[place], [name] ([id number]) -------------------------------------------------------------------------------- Problem List Details Patient Name: [name], [name] Date of Service: 12/23/2016 2:15 PM Medical Record Number: [id number] Patient Account Number: [id number] Date of Birth/Sex: Aug 28, 1927 (83 y.o. Female) Treating  RN: Primary Care Provider: Ria Bush Other Clinician: Referring Provider: Ria Bush Treating Provider/Extender: Frann Rider in Treatment: 1 Active Problems ICD-10 Encounter Code Description Active Date Diagnosis E11.622 Type 2 diabetes mellitus with other skin ulcer 12/12/2016 Yes I87.331 Chronic venous hypertension (  idiopathic) with ulcer and 12/12/2016 Yes inflammation of right lower extremity I82.401 Acute embolism and thrombosis of unspecified deep veins 12/12/2016 Yes of right lower extremity L03.115 Cellulitis of right lower limb 12/12/2016 Yes Inactive Problems Resolved Problems Electronic Signature(s) Signed: 12/23/2016 4:09:30 PM By: Christin Fudge MD, FACS Entered By: Christin Fudge on 12/23/2016 14:59:01 Brunette, [name] ([id number]) -------------------------------------------------------------------------------- Progress Note Details Patient Name: [name], [name] Date of Service: 12/23/2016 2:15 PM Medical Record Number: [id number] Patient Account Number: [id number] Date of Birth/Sex: Feb 15, 1928 (83 y.o. Female) Treating RN: Primary Care Provider: Ria Bush Other Clinician: Referring Provider: Ria Bush Treating Provider/Extender: Frann Rider in Treatment: 1 Subjective Chief Complaint Information obtained from Patient Patient presents for treatment of an open ulcer due to venous insufficiency, associated redness and swelling for about 2 weeks History of Present Illness (HPI) The following HPI elements were documented for the patient's wound: Location: swelling, redness and ulcer in the right lower extremity for 2 weeks Quality: Patient reports experiencing burning to affected area(s). Severity: Patient states wound are getting worse. Duration: Patient has had the wound for < 2 weeks prior to presenting for treatment Timing: Pain in wound is Intermittent (comes and goes Context: The wound appeared gradually over  time Modifying Factors: Consults to this date include:not seen any physician recently Associated Signs and Symptoms: Patient reports having difficulty standing for long periods. 83 year old patient who is known toes from a lengthy previous consultation between May 2016 to May 2017 now returns to [place] for a swelling, redness and pain of her right lower extremity for about 2 weeks. She says she has been wearing her compression stockings and most recently has not been seen by any other physician. No other significant changes in the recent past and no recent admissions to hospital. 12/23/2016 -- -- had a DVT study done today -- IMPRESSION:No evidence of DVT within the right lower extremity. ========== Old notes: This is an 83 yrs old female we have been following since the beginning of January for a wound on the right medial ankle. She had previously had venous ablation of the greater saphenous vein by Dr. Kellie Simmering. She came to [place] with erythema of the right leg and subsequently is opened in the medial aspect of the right ankle. She has not been very compliant with compression. We ordered her at juxta lite stocking, however she apparently has not been able to wear it. The area on the medial aspect of her left ankle has much less induration and erythema. The edema is better controlled. No evidence of infection is noted. 07/18/14; no major change in the condition of this wound. There is still surface slough and probably some drainage. 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 All other veins showed no evidence of Hughston, Johnelle C. ([id number]) DVT, superficial venous thrombosis or incompetence. however she does have manifestations of chronic venous hypertension and I believe we will change her dressing over to Prisma and a Profore light dressing. she sees her primary care doctor  regularly and her last hemoglobin A1c has been around 7%.her other comorbidities are hypertension, CABG in 1998, peptic ulcer disease, history of echo with a ejection fraction of 55-60% and grade 1 diastolic dysfunction. past surgical history include coronary artery bypass graft in 1998, cholecystectomy, appendectomy, hand surgery, endovenous ablation of the saphenous vein with laser by Dr. Kellie Simmering in December 2015.  08/29/2014 - she complains of increased calf pain over the past week. removed her compression bandage and pain improved. no fever or chills. Mild clear drainage. 09/04/2014 -- she has not been compliant and has not done a dressing change as we had ordered last week. She always complains and does not keep her wrap on and this past week she has done the same. She has completed a course of doxycycline. 09/25/2014 -- she has tolerated her dressing all week and has not removed it. She says it's hurting a lot today and would not like any debridement. We will do so as per her wishes. she did get a appointment with the vascular surgeon and sees him on 6/28. 10/03/2014 - She was seen by Dr. Tinnie Gens 09/30/2014. Assessment: I reviewed the previous venous duplex exam study performed in September 2015 which reveals no options for treating this venous ulcer. There is no significant superficial venous reflux to treat with laser ablation.Most recent ABI was 0.91 in the right foot with palpable dorsalis pedis pulse at 3+ Plan: patient needs to return to the [place] wound center and be treated by Dr. Con Memos. No arterial or venous options available from vascular standpoint or treatment of right leg stasis ulcer.Continued dressing changes and compression is the treatment of choice 10/16/2014 -- today will be her first application of Apligraf. 10/23/2014 -- we are going to be able to apply her second day of Apligraf next week. She is complaining of some tenderness in the region of Achilles tendon  and this is possibly due to her wrap. 11/06/2014 -- she did not have Apligraf last week because the wound was not looking good but she says she is feeling much better now. 11/13/2014 -- she has been doing well this week and she is here for her second Apligraf. 11/27/2014 -- she is here for her third application of Apligraf 12/12/2014 -- she is here for her fourth application of Apligraf. Also during these past 3 days she had an injury to her left forearm which she caught against the edge of her desk and has a lacerated wound which has gone down to the subcutaneous tissue. 12/26/2014 -- she is here for a last/fifth application of Apligraf. 04/10/2015 -- the patient is here for her first application of Puraply. 04/17/2015 -- the patient is here for her second application of Puraply. 04/24/2015 -- her Puraply was not ordered for today and hence he would use a collagen dressing locally. 05/01/2015 -- the patient is here for her third application of Puraply. 05/08/2015 -- the patient has been doing fine and overall has shown much improvement and is here for a routine visit 05/15/2015 -- she was admitted to the hospital with viral gastroenteritis and acute renal failure with dehydration and has been on supportive care since February 7. She was discharged today and has come for her wound care. Details of her hospital admission and therapy reviewed. 07/28/15; this is a patient I know from previous stays at the [facility] wound care center. I see she was [name], [name] ([id number]) most recently here predominantly for a venous insufficiency ulcer on the right lower medial leg. Apparently she phones our nurses on a regular basis and there was concern generated that she actually had a lower extremity wound. She arrives with a difficult to follow litany of complaints against the various compression garments we have supplied including I believe juxtalite stockings and 2 layer compression. She seems  to have settled on wearing just one layer of  the 2 layer compression. I'm really not certain at this point what her problem was with the juxtalite stockings 08/17/2015 -- she has been using some silver product on her lower extremity on the right and was concerned about that blackish discoloration. He has no open wounds. She has been wearing some compression stockings which may be the 15-20 mm type. ========== Objective Constitutional Pulse regular. Respirations normal and unlabored. Afebrile. Vitals Time Taken: 2:30 PM, Height: 58 in, Weight: 148 lbs, BMI: 30.9, Temperature: 98.1 F, Pulse: 55 bpm, Respiratory Rate: 16 breaths/min, Blood Pressure: 135/63 mmHg. Eyes Nonicteric. Reactive to light. Ears, Nose, Mouth, and Throat Lips, teeth, and gums WNL.Marland Kitchen Moist mucosa without lesions. Neck supple and nontender. No palpable supraclavicular or cervical adenopathy. Normal sized without goiter. Respiratory WNL. No retractions.. Cardiovascular Pedal Pulses WNL. No clubbing, cyanosis or edema. Lymphatic No adneopathy. No adenopathy. No adenopathy. Musculoskeletal Adexa without tenderness or enlargement.. Digits and nails w/o clubbing, cyanosis, infection, petechiae, ischemia, or inflammatory conditions.Marland Kitchen Payton, Kenslee C. ([id number]) Psychiatric Judgement and insight Intact.. No evidence of depression, anxiety, or agitation.. General Notes: the right lower extremity is looking very good today with no evidence of significant lymphedema or cellulitis and there is a small eschar on the right lateral leg but no evidence of any open ulceration. Integumentary (Hair, Skin) No suspicious lesions. No crepitus or fluctuance. No peri-wound warmth or erythema. No masses.. Wound #4 status is Open. Original cause of wound was Gradually Appeared. The wound is located on the Right,Lateral Lower Leg. The wound measures 0.7cm length x 0.5cm width x 0.1cm depth; 0.275cm^2 area and 0.027cm^3 volume. The  wound is limited to skin breakdown. There is no tunneling or undermining noted. There is a large amount of serous drainage noted. The wound margin is indistinct and nonvisible. There is no granulation within the wound bed. There is a large (67-100%) amount of necrotic tissue within the wound bed including Adherent Slough. The periwound skin appearance exhibited: Maceration. The periwound skin appearance did not exhibit: Callus, Crepitus, Excoriation, Induration, Rash, Scarring, Dry/Scaly, Atrophie Blanche, Cyanosis, Ecchymosis, Hemosiderin Staining, Mottled, Pallor, Rubor, Erythema. Wound #5 status is Healed - Epithelialized. Original cause of wound was Gradually Appeared. The wound is located on the Right,Medial Lower Leg. The wound measures 0cm length x 0cm width x 0cm depth; 0cm^2 area and 0cm^3 volume. There is no tunneling or undermining noted. There is a none present amount of drainage noted. The wound margin is indistinct and nonvisible. There is no granulation within the wound bed. There is no necrotic tissue within the wound bed. The periwound skin appearance exhibited: Maceration. The periwound skin appearance did not exhibit: Callus, Crepitus, Excoriation, Induration, Rash, Scarring, Dry/Scaly, Atrophie Blanche, Cyanosis, Ecchymosis, Hemosiderin Staining, Mottled, Pallor, Rubor, Erythema. Assessment Active Problems ICD-10 E11.622 - Type 2 diabetes mellitus with other skin ulcer I87.331 - Chronic venous hypertension (idiopathic) with ulcer and inflammation of right lower extremity I82.401 - Acute embolism and thrombosis of unspecified deep veins of right lower extremity L03.115 - Cellulitis of right lower limb Plan Holts, Milliani C. ([id number]) Wound Cleansing: Wound #4 Right,Lateral Lower Leg: Clean wound with Normal Saline. Anesthetic: Wound #4 Right,Lateral Lower Leg: Topical Lidocaine 4% cream applied to wound bed prior to debridement Primary Wound Dressing: Wound #4  Right,Lateral Lower Leg: Foam Secondary Dressing: Wound #4 Right,Lateral Lower Leg: ABD pad Kerlix and Coban Dressing Change Frequency: Wound #4 Right,Lateral Lower Leg: Change dressing every week Follow-up Appointments: Wound #4 Right,Lateral Lower Leg: Return Appointment in 1 week. Edema  Control: Wound #4 Right,Lateral Lower Leg: Elevate legs to the level of the heart and pump ankles as often as possible Additional Orders / Instructions: Wound #4 Right,Lateral Lower Leg: Activity as tolerated she has made a good recovery over the last 2 weeks and after review today I have recommended: 1. Foam and a 2 layer compression with Kerlix and Coban to be left on for the week. 2. Asked her to bring all her compression wraps and stockings so that we can send her with appropriate advice next week 3. elevation and exercise discussed with her in detail 4. 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 Date of Service: 12/23/2016 Medical Record Number: [id number] Patient Account Number: [id number] Date of Birth/Sex: [birth year] (83 y.o. Female) Treating RN: Primary Care Provider: Ria Bush Other Clinician: Referring Provider: Ria Bush Treating Provider/Extender: Frann Rider in Treatment: 1 Diagnosis Coding ICD-10 Codes Code Description E11.622 Type 2 diabetes mellitus with other skin ulcer Chronic venous hypertension (idiopathic) with ulcer and inflammation of right lower I87.331 extremity I82.401 Acute embolism and thrombosis of unspecified deep veins of right lower extremity L03.115 Cellulitis of right lower limb Facility  Procedures CPT4 Code: [id number] Description: 99213 - WOUND CARE VISIT-LEV 3 EST PT Modifier: Quantity: 1 Physician Procedures CPT4: Description Modifier Quantity Code [id number] 99213 - WC PHYS LEVEL 3 - EST PT 1 ICD-10 Description Diagnosis E11.622 Type 2 diabetes mellitus with other skin ulcer I87.331 Chronic venous hypertension (idiopathic) with ulcer and inflammation of right  lower extremity I82.401 Acute embolism and thrombosis of unspecified deep veins of right lower extremity L03.115 Cellulitis of right lower limb Electronic Signature(s) Signed: 12/23/2016 5:06:49 PM By: Christin Fudge MD, FACS Signed: 12/23/2016 5:20:28 PM By: Montey Hora Previous Signature: 12/23/2016 4:09:30 PM Version By: Christin Fudge MD, FACS Entered By: Montey Hora on 12/23/2016 16:51:05

## 2016-12-30 ENCOUNTER — Encounter: Payer: Medicare Other | Admitting: Physician Assistant

## 2016-12-30 DIAGNOSIS — I87331 Chronic venous hypertension (idiopathic) with ulcer and inflammation of right lower extremity: Secondary | ICD-10-CM | POA: Diagnosis not present

## 2016-12-30 DIAGNOSIS — L97812 Non-pressure chronic ulcer of other part of right lower leg with fat layer exposed: Secondary | ICD-10-CM | POA: Diagnosis not present

## 2016-12-30 DIAGNOSIS — Z951 Presence of aortocoronary bypass graft: Secondary | ICD-10-CM | POA: Diagnosis not present

## 2016-12-30 DIAGNOSIS — I10 Essential (primary) hypertension: Secondary | ICD-10-CM | POA: Diagnosis not present

## 2016-12-30 DIAGNOSIS — E11622 Type 2 diabetes mellitus with other skin ulcer: Secondary | ICD-10-CM | POA: Diagnosis not present

## 2016-12-30 DIAGNOSIS — I82401 Acute embolism and thrombosis of unspecified deep veins of right lower extremity: Secondary | ICD-10-CM | POA: Diagnosis not present

## 2016-12-30 DIAGNOSIS — I872 Venous insufficiency (chronic) (peripheral): Secondary | ICD-10-CM | POA: Diagnosis not present

## 2016-12-30 DIAGNOSIS — L03115 Cellulitis of right lower limb: Secondary | ICD-10-CM | POA: Diagnosis not present

## 2017-01-02 NOTE — Progress Notes (Signed)
MARQUESHA, ROBIDEAU ([id number]) Visit Report for 12/30/2016 Chief Complaint Document Details Patient Name: [name], [name] Date of Service: 12/30/2016 3:15 PM Medical Record Number: [id number] Patient Account Number: [id number] Date of Birth/Sex: Dec 16, 1927 (83 y.o. Female) Treating RN: Carolyne Fiscal, Debi Primary Care Provider: Ria Bush Other Clinician: Referring Provider: Ria Bush Treating Provider/Extender: Melburn Hake, Niketa Turner Weeks in Treatment: 2 Information Obtained from: Patient Chief Complaint Patient presents for treatment of an open ulcer due to venous insufficiency, associated redness and swelling Electronic Signature(s) Signed: 12/30/2016 5:13:37 PM By: Worthy Keeler PA-C Entered By: Worthy Keeler on 12/30/2016 15:25:37 Wegman, [name] ([id number]) -------------------------------------------------------------------------------- HPI Details Patient Name: [name], [name] Date of Service: 12/30/2016 3:15 PM Medical Record Number: [id number] Patient Account Number: [id number] Date of Birth/Sex: 1927-04-20 (83 y.o. Female) Treating RN: Carolyne Fiscal, Debi Primary Care Provider: Ria Bush Other Clinician: Referring Provider: Ria Bush Treating Provider/Extender: Melburn Hake, Maisen Klingler Weeks in Treatment: 2 History of Present Illness Location: swelling, redness and ulcer in the right lower extremity for 2 weeks Quality: Patient reports experiencing burning to affected area(s). Severity: Patient states wound are getting worse. Duration: Patient has had the wound for < 2 weeks prior to presenting for treatment Timing: Pain in wound is Intermittent (comes and goes Context: The wound appeared gradually over time Modifying Factors: Consults to this date include:not seen any physician recently Associated Signs and Symptoms: Patient reports having difficulty standing for long periods. HPI Description: 83 year old patient who is known toes from a lengthy  previous consultation between May 2016 to May 2017 now returns to [place] for a swelling, redness and pain of her right lower extremity for about 2 weeks. She says she has been wearing her compression stockings and most recently has not been seen by any other physician. No other significant changes in the recent past and no recent admissions to hospital. 12/23/2016 -- -- had a DVT study done today -- IMPRESSION:No evidence of DVT within the right lower extremity. 12/30/16 on evaluation today patient appears to be doing fairly well in regard to her right lateral lower extremity wound. She has been tolerating the dressing changes without complication after this point and the wraps also she has been tolerating. Fortunately she has no power bladder dysfunction no unexpected fevers chills or weight loss at this point. 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 She has really missed being around people. ========== Old notes: This is an 83 yrs old female we have been following since the beginning of January for a wound on the right medial ankle. She had previously had venous ablation of the greater saphenous vein by Dr. Kellie Simmering. She came to [place] with erythema of the right leg and subsequently is opened in the medial aspect of the right ankle. She has not been very compliant with compression. We ordered her at juxta lite stocking, however she apparently has not been able to wear it. The area on the medial aspect of her left ankle has much less induration and erythema. The edema is better controlled. No evidence of infection is noted. 07/18/14; no major change in the condition of this wound. There is still surface slough and probably some drainage. 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 All other veins showed no evidence of DVT, superficial venous thrombosis  or incompetence. however she does have manifestations of  chronic venous hypertension and I believe we will change her dressing over to Prisma and a Profore light dressing. [name], [name] ([id number]) she sees her primary care doctor regularly and her last hemoglobin A1c has been around 7%.her other comorbidities are hypertension, CABG in 1998, peptic ulcer disease, history of echo with a ejection fraction of 55-60% and grade 1 diastolic dysfunction. past surgical history include coronary artery bypass graft in 1998, cholecystectomy, appendectomy, hand surgery, endovenous ablation of the saphenous vein with laser by Dr. Kellie Simmering in December 2015. 08/29/2014 - she complains of increased calf pain over the past week. removed her compression bandage and pain improved. no fever or chills. Mild clear drainage. 09/04/2014 -- she has not been compliant and has not done a dressing change as we had ordered last week. She always complains and does not keep her wrap on and this past week she has done the same. She has completed a course of doxycycline. 09/25/2014 -- she has tolerated her dressing all week and has not removed it. She says it's hurting a lot today and would not like any debridement. We will do so as per her wishes. she did get a appointment with the vascular surgeon and sees him on 6/28. 10/03/2014 - She was seen by Dr. Tinnie Gens 09/30/2014. Assessment: I reviewed the previous venous duplex exam study performed in September 2015 which reveals no options for treating this venous ulcer. There is no significant superficial venous reflux to treat with laser ablation.Most recent ABI was 0.91 in the right foot with palpable dorsalis pedis pulse at 3+ Plan: patient needs to return to the [place] wound center and be treated by Dr. Con Memos. No arterial or venous options available from vascular standpoint or treatment of right leg stasis ulcer.Continued dressing changes and compression is the  treatment of choice 10/16/2014 -- today will be her first application of Apligraf. 10/23/2014 -- we are going to be able to apply her second day of Apligraf next week. She is complaining of some tenderness in the region of Achilles tendon and this is possibly due to her wrap. 11/06/2014 -- she did not have Apligraf last week because the wound was not looking good but she says she is feeling much better now. 11/13/2014 -- she has been doing well this week and she is here for her second Apligraf. 11/27/2014 -- she is here for her third application of Apligraf 12/12/2014 -- she is here for her fourth application of Apligraf. Also during these past 3 days she had an injury to her left forearm which she caught against the edge of her desk and has a lacerated wound which has gone down to the subcutaneous tissue. 12/26/2014 -- she is here for a last/fifth application of Apligraf. 04/10/2015 -- the patient is here for her first application of Puraply. 04/17/2015 -- the patient is here for her second application of Puraply. 04/24/2015 -- her Puraply was not ordered for today and hence he would use a collagen dressing locally. 05/01/2015 -- the patient is here for her third application of Puraply. 05/08/2015 -- the patient has been doing fine and overall has shown much improvement and is here for a routine visit 05/15/2015 -- she was admitted to the hospital with viral gastroenteritis and acute renal failure with dehydration and has been on supportive care since February 7. She was discharged today and has come for her wound care. Details of her hospital admission and therapy reviewed. 07/28/15; this is a patient I know from previous  stays at the [facility] wound care center. I see she was most recently here predominantly for a venous insufficiency ulcer on the right lower medial leg. Apparently she phones our nurses on a regular basis and there was concern generated that she actually had a  lower extremity wound. She arrives with a difficult to follow litany of complaints against the various compression [name], [name] ([id number]) garments we have supplied including I believe juxtalite stockings and 2 layer compression. She seems to have settled on wearing just one layer of the 2 layer compression. I'm really not certain at this point what her problem was with the juxtalite stockings 08/17/2015 -- she has been using some silver product on her lower extremity on the right and was concerned about that blackish discoloration. He has no open wounds. She has been wearing some compression stockings which may be the 15-20 mm type. ========== Electronic Signature(s) Signed: 12/30/2016 5:13:37 PM By: Worthy Keeler PA-C Entered By: Worthy Keeler on 12/30/2016 15:43:38 Blomgren, [name] ([id number]) -------------------------------------------------------------------------------- Physical Exam Details Patient Name: [name], [name] Date of Service: 12/30/2016 3:15 PM Medical Record Number: [id number] Patient Account Number: [id number] Date of Birth/Sex: [birth year] (83 y.o. Female) Treating RN: Carolyne Fiscal, Debi Primary Care Provider: Ria Bush Other Clinician: Referring Provider: Ria Bush Treating Provider/Extender: STONE III, Nature Vogelsang Weeks in Treatment: 2 Constitutional Well-nourished and well-hydrated in no acute distress. Respiratory normal breathing without difficulty. clear to auscultation bilaterally. Cardiovascular regular rate and rhythm with normal S1, S2. 1+ pitting edema of the bilateral lower extremities. Psychiatric this patient is able to make decisions and demonstrates good insight into disease process. Alert and Oriented x 3. pleasant and cooperative. Notes Patient's wound appears to be doing very well with no evidence of infection at this point. She has been tolerating the two layer Kerlex in Coban wraps without any complication. Electronic  Signature(s) Signed: 12/30/2016 5:13:37 PM By: Worthy Keeler PA-C Entered By: Worthy Keeler on 12/30/2016 15:44:13 [name], [name] ([id number]) -------------------------------------------------------------------------------- Physician Orders Details Patient Name: [name], [name] Date of Service: 12/30/2016 3:15 PM Medical Record Number: [id number] Patient Account Number: [id number] Date of Birth/Sex: Aug 04, 1927 (83 y.o. Female) Treating RN: Carolyne Fiscal, Debi Primary Care Provider: Ria Bush Other Clinician: Referring Provider: Ria Bush Treating Provider/Extender: Melburn Hake, Neil Errickson Weeks in Treatment: 2 Verbal / Phone Orders: Yes Clinician: Carolyne Fiscal, Debi Read Back and Verified: Yes Diagnosis Coding ICD-10 Coding Code Description E11.622 Type 2 diabetes mellitus with other skin ulcer Chronic venous hypertension (idiopathic) with ulcer and inflammation of right lower I87.331 extremity I82.401 Acute embolism and thrombosis of unspecified deep veins of right lower extremity L03.115 Cellulitis of right lower limb Wound Cleansing Wound #4 Right,Lateral Lower Leg o Clean wound with Normal Saline. Anesthetic Wound #4 Right,Lateral Lower Leg o Topical Lidocaine 4% cream applied to wound bed prior to debridement Primary Wound Dressing Wound #4 Right,Lateral Lower Leg o Silvercel Non-Adherent Secondary Dressing Wound #4 Right,Lateral Lower Leg o ABD pad Dressing Change Frequency Wound #4 Right,Lateral Lower Leg o Change dressing every week Follow-up Appointments Wound #4 Right,Lateral Lower Leg o Return Appointment in 1 week. Edema Control Wound #4 Right,Lateral Lower Leg Worlds, Nubia C. ([id number]) o Kerlix and Coban - Right Lower Extremity o Elevate legs to the level of the heart and pump ankles as often as possible Additional Orders / Instructions Wound #4 Right,Lateral Lower Leg o Activity as tolerated Electronic  Signature(s) Signed: 12/30/2016 5:13:37 PM By: Worthy Keeler PA-C Signed: 01/02/2017 9:42:14 AM By: Carolyne Fiscal,  Debra Entered By: Alric Quan on 12/30/2016 15:30:32 [name], [name] ([id number]) -------------------------------------------------------------------------------- Problem List Details Patient Name: Tanzi, Emalia C. Date of Service: 12/30/2016 3:15 PM Medical Record Number: [id number] Patient Account Number: [id number] Date of Birth/Sex: [birth year] (83 y.o. Female) Treating RN: Carolyne Fiscal, Debi Primary Care Provider: Ria Bush Other Clinician: Referring Provider: Ria Bush Treating Provider/Extender: Melburn Hake, Kassondra Geil Weeks in Treatment: 2 Active Problems ICD-10 Encounter Code Description Active Date Diagnosis E11.622 Type 2 diabetes mellitus with other skin ulcer 12/12/2016 Yes I87.331 Chronic venous hypertension (idiopathic) with ulcer and 12/12/2016 Yes inflammation of right lower extremity I82.401 Acute embolism and thrombosis of unspecified deep veins 12/12/2016 Yes of right lower extremity L03.115 Cellulitis of right lower limb 12/12/2016 Yes Inactive Problems Resolved Problems Electronic Signature(s) Signed: 12/30/2016 5:13:37 PM By: Worthy Keeler PA-C Entered By: Worthy Keeler on 12/30/2016 15:25:13 [name], [name] ([id number]) -------------------------------------------------------------------------------- Progress Note Details Patient Name: [name], [name] Date of Service: 12/30/2016 3:15 PM Medical Record Number: [id number] Patient Account Number: [id number] Date of Birth/Sex: 07/03/1927 (83 y.o. Female) Treating RN: Carolyne Fiscal, Debi Primary Care Provider: Ria Bush Other Clinician: Referring Provider: Ria Bush Treating Provider/Extender: Melburn Hake, Maddisyn Hegwood Weeks in Treatment: 2 Subjective Chief Complaint Information obtained from Patient Patient presents for treatment of an open ulcer due to venous insufficiency,  associated redness and swelling History of Present Illness (HPI) The following HPI elements were documented for the patient's wound: Location: swelling, redness and ulcer in the right lower extremity for 2 weeks Quality: Patient reports experiencing burning to affected area(s). Severity: Patient states wound are getting worse. Duration: Patient has had the wound for < 2 weeks prior to presenting for treatment Timing: Pain in wound is Intermittent (comes and goes Context: The wound appeared gradually over time Modifying Factors: Consults to this date include:not seen any physician recently Associated Signs and Symptoms: Patient reports having difficulty standing for long periods. 83 year old patient who is known toes from a lengthy previous consultation between May 2016 to May 2017 now returns to [place] for a swelling, redness and pain of her right lower extremity for about 2 weeks. She says she has been wearing her compression stockings and most recently has not been seen by any other physician. No other significant changes in the recent past and no recent admissions to hospital. 12/23/2016 -- -- had a DVT study done today -- IMPRESSION:No evidence of DVT within the right lower extremity. 12/30/16 on evaluation today patient appears to be doing fairly well in regard to her right lateral lower extremity wound. She has been tolerating the dressing changes without complication after this point and the wraps also she has been tolerating. Fortunately she has no power bladder dysfunction no unexpected fevers chills or weight loss at this point. 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 She has really missed being around people. ========== Old notes: This is an 83 yrs old female we have been following since the beginning of January for a wound on the right medial ankle. She had previously had venous ablation of the greater saphenous vein by Dr. Kellie Simmering. She came to [place] with  erythema of the right leg and subsequently is opened in the medial aspect of the right ankle. She has not been very compliant with compression. We ordered her at juxta lite stocking, however she apparently has not been able to wear it. The area on the medial aspect of her left ankle has much less [name], [name] ([id number]) induration  and erythema. The edema is better controlled. No evidence of infection is noted. 07/18/14; no major change in the condition of this wound. There is still surface slough and probably some drainage. 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 All other veins showed no evidence of DVT, superficial venous thrombosis or incompetence. however she does have manifestations of chronic venous hypertension and I believe we will change her dressing over to Prisma and a Profore light dressing. she sees her primary care doctor regularly and her last hemoglobin A1c has been around 7%.her other comorbidities are hypertension, CABG in 1998, peptic ulcer disease, history of echo with a ejection fraction of 55-60% and grade 1 diastolic dysfunction. past surgical history include coronary artery bypass graft in 1998, cholecystectomy, appendectomy, hand surgery, endovenous ablation of the saphenous vein with laser by Dr. Kellie Simmering in December 2015. 08/29/2014 - she complains of increased calf pain over the past week. removed her compression bandage and pain improved. no fever or chills. Mild clear drainage. 09/04/2014 -- she has not been compliant and has not done a dressing change as we had ordered last week. She always complains and does not keep her wrap on and this past week she has done the same. She has completed a course of doxycycline. 09/25/2014 -- she has tolerated her dressing all week and has not removed it. She says it's hurting a lot today and would not  like any debridement. We will do so as per her wishes. she did get a appointment with the vascular surgeon and sees him on 6/28. 10/03/2014 - She was seen by Dr. Tinnie Gens 09/30/2014. Assessment: I reviewed the previous venous duplex exam study performed in September 2015 which reveals no options for treating this venous ulcer. There is no significant superficial venous reflux to treat with laser ablation.Most recent ABI was 0.91 in the right foot with palpable dorsalis pedis pulse at 3+ Plan: patient needs to return to the [place] wound center and be treated by Dr. Con Memos. No arterial or venous options available from vascular standpoint or treatment of right leg stasis ulcer.Continued dressing changes and compression is the treatment of choice 10/16/2014 -- today will be her first application of Apligraf. 10/23/2014 -- we are going to be able to apply her second day of Apligraf next week. She is complaining of some tenderness in the region of Achilles tendon and this is possibly due to her wrap. 11/06/2014 -- she did not have Apligraf last week because the wound was not looking good but she says she is feeling much better now. 11/13/2014 -- she has been doing well this week and she is here for her second Apligraf. 11/27/2014 -- she is here for her third application of Apligraf 12/12/2014 -- she is here for her fourth application of Apligraf. Also during these past 3 days she had an injury to her left forearm which she caught against the edge of her desk and has a lacerated wound which has gone down to the subcutaneous tissue. 12/26/2014 -- she is here for a last/fifth application of Apligraf. 04/10/2015 -- the patient is here for her first application of Puraply. 04/17/2015 -- the patient is here for her second application of Puraply. 04/24/2015 -- her Puraply was not ordered for today and hence he would use a collagen dressing locally. 05/01/2015 -- the patient is here for her third  application of Puraply. 05/08/2015 -- the patient has been doing fine and overall has shown much improvement and is here for a Cart, [place] C. ([id number]) routine visit 05/15/2015 -- she was admitted to the hospital with viral gastroenteritis and acute renal failure with dehydration and has been on supportive care since February 7. She was discharged today and has come for her wound care. Details of her hospital admission and therapy reviewed. 07/28/15; this is a patient I know from previous stays at the [facility] wound care center. I see she was most recently here predominantly for a venous insufficiency ulcer on the right lower medial leg. Apparently she phones our nurses on a regular basis and there was concern generated that she actually had a lower extremity wound. She arrives with a difficult to follow litany of complaints against the various compression garments we have supplied including I believe juxtalite stockings and 2 layer compression. She seems to have settled on wearing just one layer of the 2 layer compression. I'm really not certain at this point what her problem was with the juxtalite stockings 08/17/2015 -- she has been using some silver product on her lower extremity on the right and was concerned about that blackish discoloration. He has no open wounds. She has been wearing some compression stockings which may be the 15-20 mm type. ========== Objective Constitutional Well-nourished and well-hydrated in no acute distress. Vitals Time Taken: 3:07 PM, Height: 58 in, Weight: 148 lbs, BMI: 30.9, Temperature: 97.8 F, Pulse: 66 bpm, Respiratory Rate: 16 breaths/min, Blood Pressure: 151/50 mmHg. Respiratory normal breathing without difficulty. clear to auscultation bilaterally. Cardiovascular regular rate and rhythm with normal S1, S2. 1+ pitting edema of the bilateral lower extremities. Psychiatric this patient is able to make decisions and demonstrates good insight  into disease process. Alert and Oriented x 3. pleasant and cooperative. General Notes: Patient's wound appears to be doing very well with no evidence of infection at this point. She has been tolerating the two layer Kerlex in Coban wraps without any complication. Integumentary (Hair, Skin) [name], [name] ([id number]) Wound #4 status is Open. Original cause of wound was Gradually Appeared. The wound is located on the Right,Lateral Lower Leg. The wound measures 0.4cm length x 0.3cm width x 0.1cm depth; 0.094cm^2 area and 0.009cm^3 volume. The wound is limited to skin breakdown. There is no tunneling or undermining noted. There is a large amount of serous drainage noted. The wound margin is indistinct and nonvisible. There is large (67-100%) granulation within the wound bed. There is a small (1-33%) amount of necrotic tissue within the wound bed including Adherent Slough. The periwound skin appearance did not exhibit: Callus, Crepitus, Excoriation, Induration, Rash, Scarring, Dry/Scaly, Maceration, Atrophie Blanche, Cyanosis, Ecchymosis, Hemosiderin Staining, Mottled, Pallor, Rubor, Erythema. Periwound temperature was noted as No Abnormality. The periwound has tenderness on palpation. Assessment Active Problems ICD-10 E11.622 - Type 2 diabetes mellitus with other skin ulcer I87.331 - Chronic venous hypertension (idiopathic) with ulcer and inflammation of right lower extremity I82.401 - Acute embolism and thrombosis of unspecified deep veins of right lower extremity L03.115 - Cellulitis of right lower limb Plan Wound Cleansing: Wound #4 Right,Lateral Lower Leg: Clean wound with Normal Saline. Anesthetic: Wound #4 Right,Lateral Lower Leg: Topical Lidocaine 4% cream applied to wound bed prior to debridement Primary Wound Dressing: Wound #4 Right,Lateral Lower Leg: Silvercel Non-Adherent Secondary Dressing: Wound #4 Right,Lateral Lower Leg: ABD pad Dressing Change Frequency: Wound #4  Right,Lateral Lower Leg: Change dressing every week Follow-up Appointments: Wound #4  Right,Lateral Lower Leg: Return Appointment in 1 week. Edema Control: Wound #4 Right,Lateral Lower Leg: [name], [name] ([id number]) Kerlix and Coban - Right Lower Extremity Elevate legs to the level of the heart and pump ankles as often as possible Additional Orders / Instructions: Wound #4 Right,Lateral Lower Leg: Activity as tolerated I am going to recommend that we continue with the Kerlex in Coban wrap although we are going to switch her to a nonadherent silver alginate dressing. We will see how things do in two weeks time and will see her back in roughly one week for a rapid change nurse visit. If anything worsened significantly in the interim patient will contact our office for additional recommendations. Electronic Signature(s) Signed: 12/30/2016 5:13:37 PM By: Worthy Keeler PA-C Entered By: Worthy Keeler on 12/30/2016 15:50:19 [name], [name] ([id number]) -------------------------------------------------------------------------------- SuperBill Details Patient Name: Burbach, Marvis C. Date of Service: 12/30/2016 Medical Record Number: [id number] Patient Account Number: [id number] Date of Birth/Sex: 30-Jan-1928 (83 y.o. Female) Treating RN: Carolyne Fiscal, Debi Primary Care Provider: Ria Bush Other Clinician: Referring Provider: Ria Bush Treating Provider/Extender: Melburn Hake, Omran Keelin Weeks in Treatment: 2 Diagnosis Coding ICD-10 Codes Code Description E11.622 Type 2 diabetes mellitus with other skin ulcer Chronic venous hypertension (idiopathic) with ulcer and inflammation of right lower I87.331 extremity I82.401 Acute embolism and thrombosis of unspecified deep veins of right lower extremity L03.115 Cellulitis of right lower limb Facility Procedures CPT4 Code: 95188416 Description: 99213 - WOUND CARE VISIT-LEV 3 EST PT Modifier: Quantity: 1 Physician  Procedures CPT4: Description Modifier Quantity Code 6063016 99213 - WC PHYS LEVEL 3 - EST PT 1 ICD-10 Description Diagnosis E11.622 Type 2 diabetes mellitus with other skin ulcer I87.331 Chronic venous hypertension (idiopathic) with ulcer and inflammation of right  lower extremity I82.401 Acute embolism and thrombosis of unspecified deep veins of right lower extremity L03.115 Cellulitis of right lower limb Electronic Signature(s) Signed: 12/30/2016 5:13:37 PM By: Worthy Keeler PA-C Signed: 01/02/2017 9:42:14 AM By: Alric Quan Entered By: Alric Quan on 12/30/2016 15:54:49

## 2017-01-02 NOTE — Progress Notes (Signed)
AREAL, COCHRANE ([id number]) Visit Report for 12/30/2016 Arrival Information Details Patient Name: [name], [name] Date of Service: 12/30/2016 3:15 PM Medical Record Number: [id number] Patient Account Number: [id number] Date of Birth/Sex: 12-03-1927 (83 y.o. Female) Treating RN: [name] Primary Care [name]: [name] Other Clinician: Referring [name]: [name] Treating Akeyla Molden/Extender: [name] Weeks in Treatment: 2 Visit Information History Since Last Visit All ordered tests and consults were completed: No Patient Arrived: Walker Added or deleted any medications: No Arrival Time: 15:06 Any new allergies or adverse reactions: No Accompanied By: self Had a fall or experienced change in No Transfer Assistance: EasyPivot Patient activities of daily living that may affect Lift risk of falls: Patient Identification Verified: Yes Signs or symptoms of abuse/neglect since last No Secondary Verification Process Yes visito Completed: Hospitalized since last visit: No Patient Requires Transmission- No Has Dressing in Place as Prescribed: Yes Based Precautions: Has Compression in Place as Prescribed: Yes Patient Has Alerts: Yes Pain Present Now: No Patient Alerts: Patient on Blood Thinner 81mg  aspirin Electronic Signature(s) Signed: 01/02/2017 9:42:14 AM By: [name] Entered By: [name] on 12/30/2016 15:07:17 [name] ([id number]) -------------------------------------------------------------------------------- Clinic Level of Care Assessment Details Patient Name: [name], [name] Date of Service: 12/30/2016 3:15 PM Medical Record Number: [id number] Patient Account Number: [id number] Date of Birth/Sex: [birth year] (83 y.o. Female) Treating RN: [name] Primary Care Linsie Lupo: [name] Other Clinician: Referring [name]: [name] Treating Affie Gasner/Extender: [name] Weeks in Treatment:  2 Clinic Level of Care Assessment Items TOOL 4 Quantity Score X - Use when only an EandM is performed on FOLLOW-UP visit 1 0 ASSESSMENTS - Nursing Assessment / Reassessment X - Reassessment of Co-morbidities (includes updates in patient status) 1 10 X - Reassessment of Adherence to Treatment Plan 1 5 ASSESSMENTS - Wound and Skin Assessment / Reassessment X - Simple Wound Assessment / Reassessment - one wound 1 5 []  - Complex Wound Assessment / Reassessment - multiple wounds 0 []  - Dermatologic / Skin Assessment (not related to wound area) 0 ASSESSMENTS - Focused Assessment []  - Circumferential Edema Measurements - multi extremities 0 []  - Nutritional Assessment / Counseling / Intervention 0 []  - Lower Extremity Assessment (monofilament, tuning fork, pulses) 0 []  - Peripheral Arterial Disease Assessment (using hand held doppler) 0 ASSESSMENTS - Ostomy and/or Continence Assessment and Care []  - Incontinence Assessment and Management 0 []  - Ostomy Care Assessment and Management (repouching, etc.) 0 PROCESS - Coordination of Care X - Simple Patient / Family Education for ongoing care 1 15 []  - Complex (extensive) Patient / Family Education for ongoing care 0 []  - Staff obtains Programmer, systems, Records, Test Results / Process Orders 0 []  - Staff telephones HHA, Nursing Homes / Clarify orders / etc 0 []  - Routine Transfer to another Facility (non-emergent condition) 0 [name], [name] ([id number]) []  - Routine Hospital Admission (non-emergent condition) 0 []  - New Admissions / Biomedical engineer / Ordering NPWT, Apligraf, etc. 0 []  - Emergency Hospital Admission (emergent condition) 0 X - Simple Discharge Coordination 1 10 []  - Complex (extensive) Discharge Coordination 0 PROCESS - Special Needs []  - Pediatric / Minor Patient Management 0 []  - Isolation Patient Management 0 []  - Hearing / Language / Visual special needs 0 []  - Assessment of Community assistance (transportation, D/C  planning, etc.) 0 []  - Additional assistance / Altered mentation 0 []  - Support Surface(s) Assessment (bed, cushion, seat, etc.) 0 INTERVENTIONS - Wound Cleansing / Measurement X - Simple Wound Cleansing -  one wound 1 5 []  - Complex Wound Cleansing - multiple wounds 0 X - Wound Imaging (photographs - any number of wounds) 1 5 []  - Wound Tracing (instead of photographs) 0 X - Simple Wound Measurement - one wound 1 5 []  - Complex Wound Measurement - multiple wounds 0 INTERVENTIONS - Wound Dressings []  - Small Wound Dressing one or multiple wounds 0 []  - Medium Wound Dressing one or multiple wounds 0 X - Large Wound Dressing one or multiple wounds 1 20 []  - Application of Medications - topical 0 X - Application of Medications - injection 1 10 INTERVENTIONS - Miscellaneous []  - External ear exam 0 [name], [name] ([id number]) []  - Specimen Collection (cultures, biopsies, blood, body fluids, etc.) 0 []  - Specimen(s) / Culture(s) sent or taken to Lab for analysis 0 []  - Patient Transfer (multiple staff / Harrel Lemon Lift / Similar devices) 0 []  - Simple Staple / Suture removal (25 or less) 0 []  - Complex Staple / Suture removal (26 or more) 0 []  - Hypo / Hyperglycemic Management (close monitor of Blood Glucose) 0 []  - Ankle / Brachial Index (ABI) - do not check if billed separately 0 X - Vital Signs 1 5 Has the patient been seen at the hospital within the last three years: Yes Total Score: 95 Level Of Care: New/Established - Level 3 Electronic Signature(s) Signed: 01/02/2017 9:42:14 AM By: [name] Entered By: [name] on 12/30/2016 15:54:42 [name] ([id number]) -------------------------------------------------------------------------------- Encounter Discharge Information Details Patient Name: [name], [name] Date of Service: 12/30/2016 3:15 PM Medical Record Number: [id number] Patient Account Number: [id number] Date of Birth/Sex: November 01, 1927 (83 y.o.  Female) Treating RN: [name] Primary Care Jaquail Mclees: [name] Other Clinician: Referring [name]: [name] Treating Jernard Reiber/Extender: [name] Weeks in Treatment: 2 Encounter Discharge Information Items Discharge Pain Level: 0 Discharge Condition: Stable Ambulatory Status: Walker Discharge Destination: Home Private Transportation: Auto Accompanied By: son Schedule Follow-up Appointment: Yes Medication Reconciliation completed and No provided to Patient/Care Naziyah Tieszen: Clinical Summary of Care: Electronic Signature(s) Signed: 01/02/2017 9:42:14 AM By: [name] Entered By: [name] on 12/30/2016 15:47:39 Thayer, [name] ([id number]) -------------------------------------------------------------------------------- Lower Extremity Assessment Details Patient Name: [name], [name] Date of Service: 12/30/2016 3:15 PM Medical Record Number: [id number] Patient Account Number: [id number] Date of Birth/Sex: [birth year] (83 y.o. Female) Treating RN: [name] Primary Care Asa Fath: [name] Other Clinician: Referring Ledell Codrington: [name] Treating Caleigha Zale/Extender: [name] Weeks in Treatment: 2 Edema Assessment Assessed: [Left: No] [Right: No] E[Left: dema] [Right: :] Calf Left: Right: Point of Measurement: 28 cm From Medial Instep 27 cm cm Ankle Left: Right: Point of Measurement: 9 cm From Medial Instep 18.4 cm cm Vascular Assessment Pulses: Dorsalis Pedis Palpable: [Left:Yes] Posterior Tibial Extremity colors, hair growth, and conditions: Extremity Color: [Left:Mottled] Temperature of Extremity: [Left:Warm] Capillary Refill: [Left:< 3 seconds] Toe Nail Assessment Left: Right: Thick: Yes Discolored: Yes Deformed: Yes Improper Length and Hygiene: No Electronic Signature(s) Signed: 01/02/2017 9:42:14 AM By: [name] Entered By: [name] on 12/30/2016 15:16:23 Carey, Kenly  CMarland Kitchen ([id number]) -------------------------------------------------------------------------------- Multi Wound Chart Details Patient Name: [name], [name] Date of Service: 12/30/2016 3:15 PM Medical Record Number: [id number] Patient Account Number: [id number] Date of Birth/Sex: [birth year] (83 y.o. Female) Treating RN: [name] Primary Care Adreanna Fickel: [name] Other Clinician: Referring Raymonde Hamblin: [name] Treating Avyana Puffenbarger/Extender: STONE III, [name] Weeks in Treatment: 2 Vital Signs Height(in): 58 Pulse(bpm): 66 Weight(lbs): 148 Blood Pressure 151/50 (mmHg): Body Mass Index(BMI): 31 Temperature(F): 97.8  Respiratory Rate 16 (breaths/min): Photos: [4:No Photos] [N/A:N/A] Wound Location: [4:Right Lower Leg - Lateral] [N/A:N/A] Wounding Event: [4:Gradually Appeared] [N/A:N/A] Primary Etiology: [4:Diabetic Wound/Ulcer of the Lower Extremity] [N/A:N/A] Comorbid History: [4:Cataracts, Anemia, Arrhythmia, Coronary Artery Disease, Hypertension, Myocardial Infarction, Type II Diabetes, Osteoarthritis] [N/A:N/A] Date Acquired: [4:10/24/2016] [N/A:N/A] Weeks of Treatment: [4:2] [N/A:N/A] Wound Status: [4:Open] [N/A:N/A] Measurements L x W x D 0.4x0.3x0.1 [N/A:N/A] (cm) Area (cm) : [4:0.094] [N/A:N/A] Volume (cm) : [4:0.009] [N/A:N/A] % Reduction in Area: [4:76.10%] [N/A:N/A] % Reduction in Volume: 76.90% [N/A:N/A] Classification: [4:Grade 1] [N/A:N/A] Exudate Amount: [4:Large] [N/A:N/A] Exudate Type: [4:Serous] [N/A:N/A] Exudate Color: [4:amber] [N/A:N/A] Wound Margin: [4:Indistinct, nonvisible] [N/A:N/A] Granulation Amount: [4:Large (67-100%)] [N/A:N/A] Necrotic Amount: [4:Small (1-33%)] [N/A:N/A] Exposed Structures: [4:Fascia: No Fat Layer (Subcutaneous Tissue) Exposed: No] [N/A:N/A] Tendon: No Muscle: No Joint: No Bone: No Limited to Skin Breakdown Epithelialization: Medium (34-66%) N/A N/A Periwound Skin Texture: Excoriation: No N/A  N/A Induration: No Callus: No Crepitus: No Rash: No Scarring: No Periwound Skin Maceration: No N/A N/A Moisture: Dry/Scaly: No Periwound Skin Color: Atrophie Blanche: No N/A N/A Cyanosis: No Ecchymosis: No Erythema: No Hemosiderin Staining: No Mottled: No Pallor: No Rubor: No Temperature: No Abnormality N/A N/A Tenderness on Yes N/A N/A Palpation: Wound Preparation: Ulcer Cleansing: N/A N/A Rinsed/Irrigated with Saline Topical Anesthetic Applied: Other: lidocaine 4% Treatment Notes Electronic Signature(s) Signed: 01/02/2017 9:42:14 AM By: [name] Entered By: [name] on 12/30/2016 15:16:54 Asquith, [name] ([id number]) -------------------------------------------------------------------------------- [place] Details Patient Name: [name], [name] Date of Service: 12/30/2016 3:15 PM Medical Record Number: [id number] Patient Account Number: [id number] Date of Birth/Sex: 1928/02/11 (83 y.o. Female) Treating RN: [name] Primary Care Bayden Gil: [name] Other Clinician: Referring Lillyian Heidt: [name] Treating Joellen Tullos/Extender: [name] Weeks in Treatment: 2 Active Inactive ` Orientation to the Wound Care Program Nursing Diagnoses: Knowledge deficit related to the wound healing center program Goals: Patient/caregiver will verbalize understanding of the [facility] Program Date Initiated: 12/12/2016 Target Resolution Date: 12/26/2016 Goal Status: Active Interventions: Provide education on orientation to the wound center Notes: ` Venous Leg Ulcer Nursing Diagnoses: Actual venous Insuffiency (use after diagnosis is confirmed) Goals: Verify adequate tissue perfusion prior to therapeutic compression application Date Initiated: 12/12/2016 Target Resolution Date: 12/12/2016 Goal Status: Active Interventions: Assess peripheral edema status every visit. Notes: ` Wound/Skin Impairment Nursing  Diagnoses: Impaired tissue integrity Pepitone, Neriah C. ([id number]) Goals: Ulcer/skin breakdown will heal within 14 weeks Date Initiated: 12/12/2016 Target Resolution Date: 03/13/2017 Goal Status: Active Interventions: Assess ulceration(s) every visit Notes: Electronic Signature(s) Signed: 01/02/2017 9:42:14 AM By: [name] Entered By: [name] on 12/30/2016 15:16:47 [name], [name] ([id number]) -------------------------------------------------------------------------------- Pain Assessment Details Patient Name: [name], [name] Date of Service: 12/30/2016 3:15 PM Medical Record Number: [id number] Patient Account Number: [id number] Date of Birth/Sex: 1927/08/05 (83 y.o. Female) Treating RN: [name] Primary Care Princess Karnes: [name] Other Clinician: Referring Lenzi Marmo: [name] Treating Orean Giarratano/Extender: [name] Weeks in Treatment: 2 Active Problems Location of Pain Severity and Description of Pain Patient Has [name] No Site Locations Pain Management and Medication Current Pain Management: Electronic Signature(s) Signed: 01/02/2017 9:42:14 AM By: [name] Entered By: [name] on 12/30/2016 15:07:22 [name], [name] ([id number]) -------------------------------------------------------------------------------- Patient/Caregiver Education Details Patient Name: [name], [name] Date of Service: 12/30/2016 3:15 PM Medical Record Number: [id number] Patient Account Number: [id number] Date of Birth/Gender: Jul 02, 1927 (83 y.o. Female) Treating RN: [name] Primary Care Physician: [name] Other Clinician: Referring Physician: Ria [name] Treating Physician/Extender: [name] Weeks  in Treatment: 2 Education Assessment Education Provided To: Patient Education Topics Provided Wound/Skin Impairment: Handouts: Other: change dressing as ordered Methods: Demonstration,  Explain/Verbal Responses: State content correctly Electronic Signature(s) Signed: 01/02/2017 9:42:14 AM By: [name] Entered By: [name] on 12/30/2016 15:47:50 [name], [name] ([id number]) -------------------------------------------------------------------------------- Wound Assessment Details Patient Name: [name], [name] Date of Service: 12/30/2016 3:15 PM Medical Record Number: [id number] Patient Account Number: [id number] Date of Birth/Sex: [birth year] (83 y.o. Female) Treating RN: [name] Primary Care Danika Kluender: [name] Other Clinician: Referring Anaalicia Reimann: [name] Treating Sanuel Ladnier/Extender: STONE III, [name] Weeks in Treatment: 2 Wound Status Wound Number: 4 Primary Diabetic Wound/Ulcer of the Lower Etiology: Extremity Wound Location: Right Lower Leg - Lateral Wound Open Wounding Event: Gradually Appeared Status: Date Acquired: 10/24/2016 Comorbid Cataracts, Anemia, Arrhythmia, Weeks Of Treatment: 2 History: Coronary Artery Disease, Hypertension, Clustered Wound: No Myocardial Infarction, Type II Diabetes, Osteoarthritis Photos Photo Uploaded By: [name] on 12/30/2016 15:26:25 Wound Measurements Length: (cm) 0.4 Width: (cm) 0.3 Depth: (cm) 0.1 Area: (cm) 0.094 Volume: (cm) 0.009 % Reduction in Area: 76.1% % Reduction in Volume: 76.9% Epithelialization: Medium (34-66%) Tunneling: No Undermining: No Wound Description Classification: Grade 1 Foul Odor Afte Wound Margin: Indistinct, nonvisible Slough/Fibrino Exudate Amount: Large Exudate Type: Serous Exudate Color: amber r Cleansing: No Yes Wound Bed Granulation Amount: Large (67-100%) Exposed Structure Necrotic Amount: Small (1-33%) Fascia Exposed: No Necrotic Quality: Adherent Slough Fat Layer (Subcutaneous Tissue) Exposed: No Nyce, Omari C. ([id number]) Tendon Exposed: No Muscle Exposed: No Joint Exposed: No Bone Exposed: No Limited to Skin  Breakdown Periwound Skin Texture Texture Color No Abnormalities Noted: No No Abnormalities Noted: No Callus: No Atrophie Blanche: No Crepitus: No Cyanosis: No Excoriation: No Ecchymosis: No Induration: No Erythema: No Rash: No Hemosiderin Staining: No Scarring: No Mottled: No Pallor: No Moisture Rubor: No No Abnormalities Noted: No Dry / Scaly: No Temperature / Pain Maceration: No Temperature: No Abnormality Tenderness on Palpation: Yes Wound Preparation Ulcer Cleansing: Rinsed/Irrigated with Saline Topical Anesthetic Applied: Other: lidocaine 4%, Treatment Notes Wound #4 (Right, Lateral Lower Leg) 1. Cleansed with: Clean wound with Normal Saline Cleanse wound with antibacterial soap and water 2. Anesthetic Topical Lidocaine 4% cream to wound bed prior to debridement 4. Dressing Applied: Other dressing (specify in notes) 5. Secondary Dressing Applied ABD Pad 7. Secured with Tape Notes kerlix, coban, silvercel Electronic Signature(s) Signed: 01/02/2017 9:42:14 AM By: [name] Entered By: [name] on 12/30/2016 15:14:50 [name], [name] ([id number]) -------------------------------------------------------------------------------- Vitals Details Patient Name: Demmon, Donnabelle C. Date of Service: 12/30/2016 3:15 PM Medical Record Number: [id number] Patient Account Number: [id number] Date of Birth/Sex: 16-Mar-1928 (83 y.o. Female) Treating RN: [name] Primary Care Anyra Kaufman: [name] Other Clinician: Referring Tanieka Pownall: [name] Treating Shaylea Ucci/Extender: STONE III, [name] Weeks in Treatment: 2 Vital Signs Time Taken: 15:07 Temperature (F): 97.8 Height (in): 58 Pulse (bpm): 66 Weight (lbs): 148 Respiratory Rate (breaths/min): 16 Body Mass Index (BMI): 30.9 Blood Pressure (mmHg): 151/50 Reference Range: 80 - 120 mg / dl Electronic Signature(s) Signed: 01/02/2017 9:42:14 AM By: [name] Entered By: [name] on 12/30/2016 15:09:15

## 2017-01-03 ENCOUNTER — Encounter: Payer: Medicare Other | Attending: Internal Medicine

## 2017-01-03 DIAGNOSIS — E11622 Type 2 diabetes mellitus with other skin ulcer: Secondary | ICD-10-CM | POA: Diagnosis not present

## 2017-01-03 DIAGNOSIS — Z951 Presence of aortocoronary bypass graft: Secondary | ICD-10-CM | POA: Insufficient documentation

## 2017-01-03 DIAGNOSIS — I10 Essential (primary) hypertension: Secondary | ICD-10-CM | POA: Insufficient documentation

## 2017-01-03 DIAGNOSIS — I87331 Chronic venous hypertension (idiopathic) with ulcer and inflammation of right lower extremity: Secondary | ICD-10-CM | POA: Diagnosis not present

## 2017-01-03 DIAGNOSIS — I82401 Acute embolism and thrombosis of unspecified deep veins of right lower extremity: Secondary | ICD-10-CM | POA: Insufficient documentation

## 2017-01-03 DIAGNOSIS — L03115 Cellulitis of right lower limb: Secondary | ICD-10-CM | POA: Insufficient documentation

## 2017-01-03 DIAGNOSIS — Z87891 Personal history of nicotine dependence: Secondary | ICD-10-CM | POA: Diagnosis not present

## 2017-01-04 NOTE — Progress Notes (Signed)
LUMA, CLOPPER ([id number]) Visit Report for 01/03/2017 Arrival Information Details Patient Name: [name], [name] Date of Service: 01/03/2017 1:15 PM Medical Record Patient Account Number: [id number] Number: Treating RN: Montey Hora 10-27-1927 (83 y.o. Other Clinician: Date of Birth/Sex: Female) Treating ROBSON, [facility] Nikoleta Dady: Ria Bush Holle Sprick/Extender: G Referring Cammi Consalvo: Donzetta Sprung in Treatment: 3 Visit Information History Since Last Visit All ordered tests and consults were completed: No Patient Arrived: Walker Added or deleted any medications: No Arrival Time: 13:26 Any new allergies or adverse reactions: No Accompanied By: self Had a fall or experienced change in No Transfer Assistance: EasyPivot Patient activities of daily living that may affect Lift risk of falls: Patient Identification Verified: Yes Signs or symptoms of abuse/neglect since last No Secondary Verification Process Yes visito Completed: Hospitalized since last visit: No Patient Requires Transmission- No Has Dressing in Place as Prescribed: Yes Based Precautions: Has Compression in Place as Prescribed: Yes Patient Has Alerts: Yes Pain Present Now: No Patient Alerts: Patient on Blood Thinner 81mg  aspirin Electronic Signature(s) Signed: 01/03/2017 5:06:38 PM By: Montey Hora Entered By: Montey Hora on 01/03/2017 13:30:28 Kargbo, [name] ([id number]) -------------------------------------------------------------------------------- Clinic Level of Care Assessment Details Patient Name: [name], [name] Date of Service: 01/03/2017 1:15 PM Medical Record Patient Account Number: [id number] Number: Treating RN: Montey Hora [birth year] (83 y.o. Other Clinician: Date of Birth/Sex: Female) Treating ROBSON, [facility] Tametha Banning: Ria Bush Neytiri Asche/Extender: G Referring Leane Loring: Donzetta Sprung in  Treatment: 3 Clinic Level of Care Assessment Items TOOL 4 Quantity Score X - Use when only an EandM is performed on FOLLOW-UP visit 1 0 ASSESSMENTS - Nursing Assessment / Reassessment X - Reassessment of Co-morbidities (includes updates in patient status) 1 10 X - Reassessment of Adherence to Treatment Plan 1 5 ASSESSMENTS - Wound and Skin Assessment / Reassessment X - Simple Wound Assessment / Reassessment - one wound 1 5 []  - Complex Wound Assessment / Reassessment - multiple wounds 0 []  - Dermatologic / Skin Assessment (not related to wound area) 0 ASSESSMENTS - Focused Assessment []  - Circumferential Edema Measurements - multi extremities 0 []  - Nutritional Assessment / Counseling / Intervention 0 []  - Lower Extremity Assessment (monofilament, tuning fork, pulses) 0 []  - Peripheral Arterial Disease Assessment (using hand held doppler) 0 ASSESSMENTS - Ostomy and/or Continence Assessment and Care []  - Incontinence Assessment and Management 0 []  - Ostomy Care Assessment and Management (repouching, etc.) 0 PROCESS - Coordination of Care X - Simple Patient / Family Education for ongoing care 1 15 []  - Complex (extensive) Patient / Family Education for ongoing care 0 []  - Staff obtains Programmer, systems, Records, Test Results / Process Orders 0 []  - Staff telephones HHA, Nursing Homes / Clarify orders / etc 0 [name], [name] ([id number]) []  - Routine Transfer to another Facility (non-emergent condition) 0 []  - Routine Hospital Admission (non-emergent condition) 0 []  - New Admissions / Biomedical engineer / Ordering NPWT, Apligraf, etc. 0 []  - Emergency Hospital Admission (emergent condition) 0 X - Simple Discharge Coordination 1 10 []  - Complex (extensive) Discharge Coordination 0 PROCESS - Special Needs []  - Pediatric / Minor Patient Management 0 []  - Isolation Patient Management 0 []  - Hearing / Language / Visual special needs 0 []  - Assessment of Community assistance  (transportation, D/C planning, etc.) 0 []  - Additional assistance / Altered mentation 0 []  - Support Surface(s) Assessment (bed, cushion, seat, etc.) 0 INTERVENTIONS - Wound Cleansing / Measurement X - Simple Wound Cleansing -  one wound 1 5 []  - Complex Wound Cleansing - multiple wounds 0 X - Wound Imaging (photographs - any number of wounds) 1 5 []  - Wound Tracing (instead of photographs) 0 X - Simple Wound Measurement - one wound 1 5 []  - Complex Wound Measurement - multiple wounds 0 INTERVENTIONS - Wound Dressings []  - Small Wound Dressing one or multiple wounds 0 []  - Medium Wound Dressing one or multiple wounds 0 X - Large Wound Dressing one or multiple wounds 1 20 []  - Application of Medications - topical 0 []  - Application of Medications - injection 0 [name], [name] ([id number]) INTERVENTIONS - Miscellaneous []  - External ear exam 0 []  - Specimen Collection (cultures, biopsies, blood, body fluids, etc.) 0 []  - Specimen(s) / Culture(s) sent or taken to Lab for analysis 0 []  - Patient Transfer (multiple staff / Harrel Lemon Lift / Similar devices) 0 []  - Simple Staple / Suture removal (25 or less) 0 []  - Complex Staple / Suture removal (26 or more) 0 []  - Hypo / Hyperglycemic Management (close monitor of Blood Glucose) 0 []  - Ankle / Brachial Index (ABI) - do not check if billed separately 0 []  - Vital Signs 0 Has the patient been seen at the hospital within the last three years: Yes Total Score: 80 Level Of Care: New/Established - Level 3 Electronic Signature(s) Signed: 01/03/2017 5:06:38 PM By: Montey Hora Entered By: Montey Hora on 01/03/2017 13:30:09 [name] ([id number]) -------------------------------------------------------------------------------- Encounter Discharge Information Details Patient Name: [name], [name] Date of Service: 01/03/2017 1:15 PM Medical Record Patient Account Number: [id number] Number: Treating RN: Montey Hora 09-07-1927 (83 y.o. Other Clinician: Date of Birth/Sex: Female) Treating ROBSON, [facility] Henry Demeritt: Ria Bush Quasean Frye/Extender: G Referring Jaleen Finch: Donzetta Sprung in Treatment: 3 Encounter Discharge Information Items Discharge Pain Level: 0 Discharge Condition: Stable Ambulatory Status: Walker Discharge Destination: Home Private Transportation: Auto Accompanied By: self Schedule Follow-up Appointment: Yes Medication Reconciliation completed and No provided to Patient/Care Malakie Balis: Clinical Summary of Care: Electronic Signature(s) Signed: 01/03/2017 5:06:38 PM By: Montey Hora Entered By: Montey Hora on 01/03/2017 13:29:45 Tullis, [name] ([id number]) -------------------------------------------------------------------------------- Patient/Caregiver Education Details Patient Name: [name] Date of Service: 01/03/2017 1:15 PM Medical Record Patient Account Number: [id number] Number: Treating RN: Montey Hora [birth year] (83 y.o. Other Clinician: Date of Birth/Gender: Female) Treating ROBSON, [facility] Primary [facility] Physician/Extender: Ferd Glassing Physician: Weeks in Treatment: 3 Referring Physician: Ria Bush Education Assessment Education Provided To: Patient Education Topics Provided Wound/Skin Impairment: Handouts: Other: do not get wrap wet Methods: Demonstration, Explain/Verbal Responses: State content correctly Electronic Signature(s) Signed: 01/03/2017 5:06:38 PM By: Montey Hora Entered By: Montey Hora on 01/03/2017 13:29:30 [name] ([id number]) -------------------------------------------------------------------------------- Wound Assessment Details Patient Name: [name], [name] Date of Service: 01/03/2017 1:15 PM Medical Record Patient Account Number: [id number] Number: Treating RN: Montey Hora 1927/06/25 (83 y.o. Other Clinician: Date of  Birth/Sex: Female) Treating ROBSON, [facility] Primary Care Bernon Arviso: Ria Bush Dayanna Pryce/Extender: G Referring Dama Hedgepeth: Donzetta Sprung in Treatment: 3 Wound Status Wound Number: 4 Primary Diabetic Wound/Ulcer of the Lower Etiology: Extremity Wound Location: Right Lower Leg - Lateral Wound Open Wounding Event: Gradually Appeared Status: Date Acquired: 10/24/2016 Comorbid Cataracts, Anemia, Arrhythmia, Weeks Of Treatment: 3 History: Coronary Artery Disease, Clustered Wound: No Hypertension, Myocardial Infarction, Type II Diabetes, Osteoarthritis Photos Photo Uploaded By: Montey Hora on 01/03/2017 16:56:34 Wound Measurements Length: (cm) 0.4 Width: (cm) 0.4 Depth: (cm) 0.1 Area: (cm) 0.126 Volume: (cm) 0.013 %  Reduction in Area: 67.9% % Reduction in Volume: 66.7% Epithelialization: Medium (34-66%) Tunneling: No Undermining: No Wound Description Classification: Grade 1 Foul Odor After Wound Margin: Indistinct, nonvisible Slough/Fibrino Exudate Amount: Large Exudate Type: Serous Exudate Color: amber Cleansing: No Yes Wound Bed Granulation Amount: Large (67-100%) Exposed Structure Messenger, Mande C. ([id number]) Necrotic Amount: Small (1-33%) Fascia Exposed: No Necrotic Quality: Adherent Slough Fat Layer (Subcutaneous Tissue) Exposed: No Tendon Exposed: No Muscle Exposed: No Joint Exposed: No Bone Exposed: No Limited to Skin Breakdown Periwound Skin Texture Texture Color No Abnormalities Noted: No No Abnormalities Noted: No Callus: No Atrophie Blanche: No Crepitus: No Cyanosis: No Excoriation: No Ecchymosis: No Induration: No Erythema: No Rash: No Hemosiderin Staining: No Scarring: No Mottled: No Pallor: No Moisture Rubor: No No Abnormalities Noted: No Dry / Scaly: No Temperature / Pain Maceration: No Temperature: No Abnormality Tenderness on Palpation: Yes Wound Preparation Ulcer Cleansing: Rinsed/Irrigated with  Saline Topical Anesthetic Applied: None Treatment Notes Wound #4 (Right, Lateral Lower Leg) 1. Cleansed with: Clean wound with Normal Saline Cleanse wound with antibacterial soap and water 4. Dressing Applied: Other dressing (specify in notes) 5. Secondary Dressing Applied ABD Pad 7. Secured with Tape Notes kerlix, coban, silvercel Electronic Signature(s) Signed: 01/03/2017 5:06:38 PM By: Montey Hora Entered By: Montey Hora on 01/03/2017 13:33:18

## 2017-01-09 ENCOUNTER — Encounter: Payer: Self-pay | Admitting: Family Medicine

## 2017-01-09 ENCOUNTER — Ambulatory Visit (INDEPENDENT_AMBULATORY_CARE_PROVIDER_SITE_OTHER): Payer: Medicare Other | Admitting: Family Medicine

## 2017-01-09 VITALS — BP 112/60 | HR 97 | Temp 98.2°F | Wt 146.2 lb

## 2017-01-09 DIAGNOSIS — Z23 Encounter for immunization: Secondary | ICD-10-CM | POA: Diagnosis not present

## 2017-01-09 DIAGNOSIS — H6123 Impacted cerumen, bilateral: Secondary | ICD-10-CM | POA: Diagnosis not present

## 2017-01-09 DIAGNOSIS — E1149 Type 2 diabetes mellitus with other diabetic neurological complication: Secondary | ICD-10-CM

## 2017-01-09 DIAGNOSIS — I83212 Varicose veins of right lower extremity with both ulcer of calf and inflammation: Secondary | ICD-10-CM

## 2017-01-09 DIAGNOSIS — I2583 Coronary atherosclerosis due to lipid rich plaque: Secondary | ICD-10-CM

## 2017-01-09 DIAGNOSIS — L97211 Non-pressure chronic ulcer of right calf limited to breakdown of skin: Secondary | ICD-10-CM | POA: Diagnosis not present

## 2017-01-09 DIAGNOSIS — M654 Radial styloid tenosynovitis [de Quervain]: Secondary | ICD-10-CM

## 2017-01-09 DIAGNOSIS — G8929 Other chronic pain: Secondary | ICD-10-CM | POA: Diagnosis not present

## 2017-01-09 DIAGNOSIS — I2510 Atherosclerotic heart disease of native coronary artery without angina pectoris: Secondary | ICD-10-CM

## 2017-01-09 MED ORDER — DICLOFENAC SODIUM 1 % TD GEL: 1.0000 "application " | Freq: Three times a day (TID) | TRANSDERMAL | 1 refills | Status: AC

## 2017-01-09 MED ORDER — HYDROCODONE-ACETAMINOPHEN 5-325 MG PO TABS: 0.5000 | ORAL_TABLET | Freq: Four times a day (QID) | ORAL | 0 refills | Status: DC | PRN

## 2017-01-09 NOTE — Progress Notes (Addendum)
BP 112/60 (BP Location: Left Arm, Patient Position: Sitting, Cuff Size: Normal)   Pulse 97   Temp 98.2 F (36.8 C) (Oral)   Wt 146 lb 4 oz (66.3 kg)   SpO2 96%   BMI 30.57 kg/m    CC: 6 mo f/u visit Subjective:    [name], female    DOB: [birth year], 83 y.o.   MRN: [id number]  HPI: [name] is a 83 y.o. female presenting on 01/09/2017 for 6 mo follow-up and Hearing Problem (Per pt's son [name], pt is hard of hearing. Pt states she has ear pain in right ear)   See prior note for details. Son requests hearing screen. R earache started recently.   Sees wound clinic for ongoing RLE venous ulcer in h/o bilateral leg varicose vein with inflammation. She is satisfied with their care.  6wk h/o L wrist pain/swelling no improved despite wrist brace use. Denies inciting trauma/injury. Right hand dominant.  DM - followed by Dr Dwyane Dee.    Hearing Screening   125Hz  250Hz  500Hz  1000Hz  2000Hz  3000Hz  4000Hz  6000Hz  8000Hz   Right ear:   20 40 20  0    Left ear:   40 0 20  20       Relevant past medical, surgical, family and social history reviewed and updated as indicated. Interim medical history since our last visit reviewed. Allergies and medications reviewed and updated. Outpatient Medications Prior to Visit  Medication Sig Dispense Refill  . acetaminophen (TYLENOL) 500 MG tablet Take 500 mg by mouth every 6 (six) hours as needed for mild pain.    Marland Kitchen acidophilus (RISAQUAD) CAPS capsule Take 1 capsule by mouth daily.    Marland Kitchen ALPRAZolam (XANAX) 1 MG tablet Take 0.5 mg by mouth 4 (four) times daily as needed for anxiety.     Marland Kitchen aspirin EC 81 MG tablet Take 81 mg by mouth daily.    Marland Kitchen DEXILANT 30 MG capsule TAKE 1 CAPSULE (30 MG TOTAL) BY MOUTH DAILY. 30 capsule 11  . ferrous sulfate 325 (65 FE) MG tablet Take 1 tablet (325 mg total) by mouth daily with breakfast.    . furosemide (LASIX) 20 MG tablet TAKE 0.5 TABLETS (10 MG TOTAL) BY MOUTH DAILY. 45 tablet 3  .  glucosamine-chondroitin 500-400 MG tablet Take 1 tablet by mouth daily.     Marland Kitchen glucose blood (ONE TOUCH ULTRA TEST) test strip USE AS INSTRUCTED TO CHECK BLOOD SUGAR ONCE A DAY Dx code: E11.9 50 each 5  . loperamide (IMODIUM) 2 MG capsule Take 2 mg by mouth 4 (four) times daily as needed for diarrhea or loose stools.    . metoprolol tartrate (LOPRESSOR) 25 MG tablet TAKE 1.5 TABLETS (37.5 MG TOTAL) BY MOUTH 2 (TWO) TIMES DAILY. 270 tablet 0  . Multiple Vitamin (MULTIVITAMIN) tablet Take 1 tablet by mouth daily. Patient reports takes a new vitamin to replace her centrum as recommended by her eye doctor.    . naproxen sodium (ANAPROX) 220 MG tablet Take 220 mg by mouth daily after breakfast.    . nitroGLYCERIN (NITROSTAT) 0.4 MG SL tablet Place 0.4 mg under the tongue every 5 (five) minutes as needed for chest pain.     Marland Kitchen nystatin-triamcinolone ointment (MYCOLOG) APPLY 1 APPLICATION TOPICALLY 2 (TWO) TIMES DAILY. AS NEEDED FOR ITCHING 60 g 3  . pioglitazone (ACTOS) 30 MG tablet TAKE 1 TABLET BY MOUTH EVERY DAY 30 tablet 3  . pravastatin (PRAVACHOL) 20 MG tablet TAKE 1 TABLET  BY MOUTH DAILY. 30 tablet 4  . ramipril (ALTACE) 5 MG capsule TAKE ONE CAPSULE BY EVERYDAY FOR BLOOD PRESSURE 30 capsule 6  . traZODone (DESYREL) 150 MG tablet Take 150 mg by mouth at bedtime.     . triamcinolone cream (KENALOG) 0.1 % APPLY 1 APPLICATION TOPICALLY 2 (TWO) TIMES DAILY.  1  . vitamin B-12 (CYANOCOBALAMIN) 1000 MCG tablet Take 1,000 mcg by mouth daily.    . vitamin E 400 UNIT capsule Take 400 Units by mouth daily.    Marland Kitchen HYDROcodone-acetaminophen (NORCO/VICODIN) 5-325 MG tablet Take 0.5-1 tablets by mouth every 6 (six) hours as needed for moderate pain. 60 tablet 0  . ramipril (ALTACE) 5 MG capsule Take 5 mg by mouth every evening.      No facility-administered medications prior to visit.      Per HPI unless specifically indicated in ROS section below Review of Systems     Objective:    BP 112/60 (BP Location:  Left Arm, Patient Position: Sitting, Cuff Size: Normal)   Pulse 97   Temp 98.2 F (36.8 C) (Oral)   Wt 146 lb 4 oz (66.3 kg)   SpO2 96%   BMI 30.57 kg/m   Wt Readings from Last 3 Encounters:  01/09/17 146 lb 4 oz (66.3 kg)  12/08/16 148 lb (67.1 kg)  12/06/16 148 lb (67.1 kg)    Physical Exam  Constitutional: She appears well-developed. No distress.  HENT:  Right Ear: External ear normal. Decreased hearing is noted.  Left Ear: External ear normal. Decreased hearing is noted.  Cerumen bilateral ears Irrigation unsuccessful, large wax impactions removed from bilateral ears with alligator forceps, pt tolerated well  Musculoskeletal: Normal range of motion.  Tender to palpation just proximal to radial wrist carpals No pain at scaphoid or at [facility] joint.  + finkelstein test  Skin:  Legs wrapped  Nursing note and vitals reviewed.     Assessment & Plan:   Problem List Items Addressed This Visit    Cerumen impaction    Irrigation performed today. Pt and son endorse hearing loss - if persistent, will refer to audiology.       De Quervain's tenosynovitis, left - Primary    Exam most consistent with this. Treat with voltaren gel, rest of hand.       Encounter for chronic pain management    [place] CSRS reviewed, appropriate.  Continue hydrocodone. Discussed coming in Q3 mo for narcotic visits.      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 She is satisfied with treatment.           Follow up plan: Return if symptoms worsen or fail to improve.  Ria Bush, MD

## 2017-01-09 NOTE — Assessment & Plan Note (Signed)
Exam most consistent with this. Treat with voltaren gel, rest of hand.

## 2017-01-09 NOTE — Patient Instructions (Addendum)
Flu shot today Hearing screen today.  Irrigation of both ears today.  You have tendonitis of wrist - called dequervain tenosynovitis. Treat with voltaren gel to wrist area for inflammation.   De Quervain Tenosynovitis Tendons attach muscles to bones. They also help with joint movements. When tendons become irritated or swollen, it is called tendinitis. The extensor pollicis brevis (EPB) tendon connects the EPB muscle to a bone that is near the base of the thumb. The EPB muscle helps to straighten and extend the thumb. De Quervain tenosynovitis is a condition in which the EPB tendon lining (sheath) becomes irritated, thickened, and swollen. This condition is sometimes called stenosing tenosynovitis. This condition causes pain on the thumb side of the back of the wrist. What are the causes? Causes of this condition include:  Activities that repeatedly cause your thumb and wrist to extend.  A sudden increase in activity or change in activity that affects your wrist.  What increases the risk? This condition is more likely to develop in:  Females.  People who have diabetes.  Women who have recently given birth.  People who are over 55 years of age.  People who do activities that involve repeated hand and wrist motions, such as tennis, racquetball, volleyball, gardening, and taking care of children.  People who do heavy labor.  People who have poor wrist strength and flexibility.  People who do not warm up properly before activities.  What are the signs or symptoms? Symptoms of this condition include:  Pain or tenderness over the thumb side of the back of the wrist when your thumb and wrist are not moving.  Pain that gets worse when you straighten your thumb or extend your thumb or wrist.  Pain when the injured area is touched.  Locking or catching of the thumb joint while you bend and straighten your thumb.  Decreased thumb motion due to pain.  Swelling over the affected  area.  How is this diagnosed? This condition is diagnosed with a medical history and physical exam. Your health care provider will ask for details about your injury and ask about your symptoms. How is this treated? Treatment may include the use of icing and medicines to reduce pain and swelling. You may also be advised to wear a splint or brace to limit your thumb and wrist motion. In less severe cases, treatment may also include working with a physical therapist to strengthen your wrist and calm the irritation around your EPB tendon sheath. In severe cases, surgery may be needed. Follow these instructions at home: If you have a splint or brace:  Wear it as told by your health care provider. Remove it only as told by your health care provider.  Loosen the splint or brace if your fingers become numb and tingle, or if they turn cold and blue.  Keep the splint or brace clean and dry. Managing pain, stiffness, and swelling  If directed, apply ice to the injured area. ? Put ice in a plastic bag. ? Place a towel between your skin and the bag. ? Leave the ice on for 20 minutes, 2-3 times per day.  Move your fingers often to avoid stiffness and to lessen swelling.  Raise (elevate) the injured area above the level of your heart while you are sitting or lying down. General instructions  Return to your normal activities as told by your health care provider. Ask your health care provider what activities are safe for you.  Take over-the-counter and prescription medicines  only as told by your health care provider.  Keep all follow-up visits as told by your health care provider. This is important.  Do not drive or operate heavy machinery while taking prescription pain medicine. Contact a health care provider if:  Your pain, tenderness, or swelling gets worse, even if you have had treatment.  You have numbness or tingling in your wrist, hand, or fingers on the injured side. This information is  not intended to replace advice given to you by your health care provider. Make sure you discuss any questions you have with your health care provider. Document Released: 03/21/2005 Document Revised: 08/27/2015 Document Reviewed: 05/27/2014 Elsevier Interactive Patient Education  [employer].

## 2017-01-09 NOTE — Assessment & Plan Note (Signed)
[place] CSRS reviewed, appropriate.  Continue hydrocodone. Discussed coming in Q3 mo for narcotic visits.

## 2017-01-09 NOTE — Assessment & Plan Note (Signed)
Irrigation performed today. Pt and son endorse hearing loss - if persistent, will refer to audiology.

## 2017-01-09 NOTE — Assessment & Plan Note (Signed)
Appreciate wound clinic care. She is satisfied with treatment.

## 2017-01-09 NOTE — Addendum Note (Signed)
Addended by: Brenton Grills on: 36/09/2945 65:46 PM   Modules accepted: Orders

## 2017-01-12 ENCOUNTER — Encounter: Payer: Medicare Other | Admitting: Surgery

## 2017-01-12 DIAGNOSIS — I872 Venous insufficiency (chronic) (peripheral): Secondary | ICD-10-CM | POA: Diagnosis not present

## 2017-01-12 DIAGNOSIS — L03115 Cellulitis of right lower limb: Secondary | ICD-10-CM | POA: Diagnosis not present

## 2017-01-12 DIAGNOSIS — L97812 Non-pressure chronic ulcer of other part of right lower leg with fat layer exposed: Secondary | ICD-10-CM | POA: Diagnosis not present

## 2017-01-12 DIAGNOSIS — I82401 Acute embolism and thrombosis of unspecified deep veins of right lower extremity: Secondary | ICD-10-CM | POA: Diagnosis not present

## 2017-01-12 DIAGNOSIS — I10 Essential (primary) hypertension: Secondary | ICD-10-CM | POA: Diagnosis not present

## 2017-01-12 DIAGNOSIS — E11622 Type 2 diabetes mellitus with other skin ulcer: Secondary | ICD-10-CM | POA: Diagnosis not present

## 2017-01-12 DIAGNOSIS — I87331 Chronic venous hypertension (idiopathic) with ulcer and inflammation of right lower extremity: Secondary | ICD-10-CM | POA: Diagnosis not present

## 2017-01-12 DIAGNOSIS — M7989 Other specified soft tissue disorders: Secondary | ICD-10-CM | POA: Diagnosis not present

## 2017-01-12 DIAGNOSIS — Z951 Presence of aortocoronary bypass graft: Secondary | ICD-10-CM | POA: Diagnosis not present

## 2017-01-12 DIAGNOSIS — L539 Erythematous condition, unspecified: Secondary | ICD-10-CM | POA: Diagnosis not present

## 2017-01-14 NOTE — Progress Notes (Signed)
[name] [name] ([id number]) Visit Report for 01/12/2017 Chief Complaint Document Details Patient Name: [name] [name] C. Date of Service: 01/12/2017 3:15 PM Medical Record Number: [id number] Patient Account Number: [id number] Date of Birth/Sex: 1927/04/12 (83 y.o. Female) Treating RN: Montey Hora Primary Care Provider: Ria Bush Other Clinician: Referring Provider: Ria Bush Treating Provider/Extender: Frann Rider in Treatment: 4 Information Obtained from: Patient Chief Complaint Patient presents for treatment of an open ulcer due to venous insufficiency, associated redness and swelling Electronic Signature(s) Signed: 01/13/2017 8:07:56 AM By: Christin Fudge MD, FACS Entered By: Christin Fudge on 01/13/2017 08:07:55 [name] [name] [name] ([id number]) -------------------------------------------------------------------------------- HPI Details Patient Name: [name] [name] C. Date of Service: 01/12/2017 3:15 PM Medical Record Number: [id number] Patient Account Number: [id number] Date of Birth/Sex: 1927-11-11 (83 y.o. Female) Treating RN: Montey Hora Primary Care Provider: Ria Bush Other Clinician: Referring Provider: Ria Bush Treating Provider/Extender: Frann Rider in Treatment: 4 History of Present Illness Location: swelling, redness and ulcer in the right lower extremity for 2 weeks Quality: Patient reports experiencing burning to affected area(s). Severity: Patient states wound are getting worse. Duration: Patient has had the wound for < 2 weeks prior to presenting for treatment Timing: Pain in wound is Intermittent (comes and goes Context: The wound appeared gradually over time Modifying Factors: Consults to this date include:not seen any physician recently Associated Signs and Symptoms: Patient reports having difficulty standing for long periods. HPI Description: 83 year old patient who is known toes from a lengthy previous  consultation between May 2016 to May 2017 now returns to [place] for a swelling, redness and pain of her right lower extremity for about 2 weeks. She says she has been wearing her compression stockings and most recently has not been seen by any other physician. No other significant changes in the recent past and no recent admissions to hospital. 12/23/2016 -- -- had a DVT study done today -- IMPRESSION:No evidence of DVT within the right lower extremity. 12/30/16 on evaluation today patient appears to be doing fairly well in regard to her right lateral lower extremity wound. She has been tolerating the dressing changes without complication after this point and the wraps also she has been tolerating. Fortunately she has no power bladder dysfunction no unexpected fevers chills or weight loss at this point. 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 She has really missed being around people. 01/12/2017 -- she returns after 2 weeks and has been using appropriate compression. However she does not have a compression stockings today. ========== Old notes: This is an 83 yrs old female we have been following since the beginning of January for a wound on the right medial ankle. She had previously had venous ablation of the greater saphenous vein by Dr. Kellie Simmering. She came to [place] with erythema of the right leg and subsequently is opened in the medial aspect of the right ankle. She has not been very compliant with compression. We ordered her at juxta lite stocking, however she apparently has not been able to wear it. The area on the medial aspect of her left ankle has much less induration and erythema. The edema is better controlled. No evidence of infection is noted. 07/18/14; no major change in the condition of this wound. There is still surface slough and probably some drainage. 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  All other veins showed no evidence of Ragas, Abbe C. ([id number]) DVT, superficial venous thrombosis or incompetence. however she does have manifestations of chronic venous hypertension and I believe we will change her dressing over to Prisma and a Profore light dressing. she sees her primary care doctor regularly and her last hemoglobin A1c has been around 7%.her other comorbidities are hypertension, CABG in 1998, peptic ulcer disease, history of echo with a ejection fraction of 55-60% and grade 1 diastolic dysfunction. past surgical history include coronary artery bypass graft in 1998, cholecystectomy, appendectomy, hand surgery, endovenous ablation of the saphenous vein with laser by Dr. Kellie Simmering in December 2015. 08/29/2014 - she complains of increased calf pain over the past week. removed her compression bandage and pain improved. no fever or chills. Mild clear drainage. 09/04/2014 -- she has not been compliant and has not done a dressing change as we had ordered last week. She always complains and does not keep her wrap on and this past week she has done the same. She has completed a course of doxycycline. 09/25/2014 -- she has tolerated her dressing all week and has not removed it. She says it's hurting a lot today and would not like any debridement. We will do so as per her wishes. she did get a appointment with the vascular surgeon and sees him on 6/28. 10/03/2014 - She was seen by Dr. Tinnie Gens 09/30/2014. Assessment: I reviewed the previous venous duplex exam study performed in September 2015 which reveals no options for treating this venous ulcer. There is no significant superficial venous reflux to treat with laser ablation.Most recent ABI was 0.91 in the right foot with palpable dorsalis pedis pulse at 3+ Plan: patient needs to return to the [place] wound center and be treated by Dr. Con Memos. No arterial  or venous options available from vascular standpoint or treatment of right leg stasis ulcer.Continued dressing changes and compression is the treatment of choice 10/16/2014 -- today will be her first application of Apligraf. 10/23/2014 -- we are going to be able to apply her second day of Apligraf next week. She is complaining of some tenderness in the region of Achilles tendon and this is possibly due to her wrap. 11/06/2014 -- she did not have Apligraf last week because the wound was not looking good but she says she is feeling much better now. 11/13/2014 -- she has been doing well this week and she is here for her second Apligraf. 11/27/2014 -- she is here for her third application of Apligraf 12/12/2014 -- she is here for her fourth application of Apligraf. Also during these past 3 days she had an injury to her left forearm which she caught against the edge of her desk and has a lacerated wound which has gone down to the subcutaneous tissue. 12/26/2014 -- she is here for a last/fifth application of Apligraf. 04/10/2015 -- the patient is here for her first application of Puraply. 04/17/2015 -- the patient is here for her second application of Puraply. 04/24/2015 -- her Puraply was not ordered for today and hence he would use a collagen dressing locally. 05/01/2015 -- the patient is here for her third application of Puraply. 05/08/2015 -- the patient has been doing fine and overall has shown much improvement and is here for a routine visit 05/15/2015 -- she was admitted to the hospital with viral gastroenteritis and acute renal failure with dehydration and has been on supportive care since February 7. She was discharged today and has come for her  wound care. Details of her hospital admission and therapy reviewed. 07/28/15; this is a patient I know from previous stays at the [facility] wound care center. I see she was [name] [name] C. ([id number]) most recently here predominantly for a  venous insufficiency ulcer on the right lower medial leg. Apparently she phones our nurses on a regular basis and there was concern generated that she actually had a lower extremity wound. She arrives with a difficult to follow litany of complaints against the various compression garments we have supplied including I believe juxtalite stockings and 2 layer compression. She seems to have settled on wearing just one layer of the 2 layer compression. I'm really not certain at this point what her problem was with the juxtalite stockings 08/17/2015 -- she has been using some silver product on her lower extremity on the right and was concerned about that blackish discoloration. He has no open wounds. She has been wearing some compression stockings which may be the 15-20 mm type. ========== Electronic Signature(s) Signed: 01/13/2017 8:09:25 AM By: Christin Fudge MD, FACS Entered By: Christin Fudge on 01/13/2017 08:09:25 Domingos, [name] [name] ([id number]) -------------------------------------------------------------------------------- Physical Exam Details Patient Name: Belles, Nolah C. Date of Service: 01/12/2017 3:15 PM Medical Record Number: [id number] Patient Account Number: [id number] Date of Birth/Sex: 06-Apr-1927 (83 y.o. Female) Treating RN: Montey Hora Primary Care Provider: Ria Bush Other Clinician: Referring Provider: Ria Bush Treating Provider/Extender: Frann Rider in Treatment: 4 Constitutional . Pulse regular. Respirations normal and unlabored. Afebrile. . Eyes Nonicteric. Reactive to light. Ears, Nose, Mouth, and Throat Lips, teeth, and gums WNL.Marland Kitchen Moist mucosa without lesions. Neck supple and nontender. No palpable supraclavicular or cervical adenopathy. Normal sized without goiter. Respiratory WNL. No retractions.. Cardiovascular Pedal Pulses WNL. No clubbing, cyanosis or edema. Lymphatic No adneopathy. No adenopathy. No  adenopathy. Musculoskeletal Adexa without tenderness or enlargement.. Digits and nails w/o clubbing, cyanosis, infection, petechiae, ischemia, or inflammatory conditions.. Integumentary (Hair, Skin) No suspicious lesions. No crepitus or fluctuance. No peri-wound warmth or erythema. No masses.Marland Kitchen Psychiatric Judgement and insight Intact.. No evidence of depression, anxiety, or agitation.. Notes the right lower extremity has good control of lymphedema and there is no open wound visible. Electronic Signature(s) Signed: 01/13/2017 8:09:49 AM By: Christin Fudge MD, FACS Entered By: Christin Fudge on 01/13/2017 08:09:48 [name] [name] [name] ([id number]) -------------------------------------------------------------------------------- Physician Orders Details Patient Name: [name] [name] C. Date of Service: 01/12/2017 3:15 PM Medical Record Number: [id number] Patient Account Number: [id number] Date of Birth/Sex: [birth year] (83 y.o. Female) Treating RN: Montey Hora Primary Care Provider: Ria Bush Other Clinician: Referring Provider: Ria Bush Treating Provider/Extender: Frann Rider in Treatment: 4 Verbal / Phone Orders: No Diagnosis Coding Wound Cleansing Wound #4 Right,Lateral Lower Leg o Clean wound with Normal Saline. Anesthetic Wound #4 Right,Lateral Lower Leg o Topical Lidocaine 4% cream applied to wound bed prior to debridement Primary Wound Dressing Wound #4 Right,Lateral Lower Leg o Foam Dressing Change Frequency Wound #4 Right,Lateral Lower Leg o Change dressing every week Follow-up Appointments Wound #4 Right,Lateral Lower Leg o Return Appointment in 1 week. Edema Control Wound #4 Right,Lateral Lower Leg o Kerlix and Coban - Right Lower Extremity o Elevate legs to the level of the heart and pump ankles as often as possible Additional Orders / Instructions Wound #4 Right,Lateral Lower Leg o Activity as tolerated Electronic  Signature(s) Signed: 01/13/2017 7:55:57 AM By: Montey Hora Signed: 01/13/2017 8:31:58 AM By: Christin Fudge MD, FACS Entered By: Montey Hora on 01/13/2017 07:55:57 [name] [name] C. ([id number]) [name] [name]  C. ([id number]) -------------------------------------------------------------------------------- Problem List Details Patient Name: Liles, Caro C. Date of Service: 01/12/2017 3:15 PM Medical Record Number: [id number] Patient Account Number: [id number] Date of Birth/Sex: 1927-10-12 (83 y.o. Female) Treating RN: Montey Hora Primary Care Provider: Ria Bush Other Clinician: Referring Provider: Ria Bush Treating Provider/Extender: Frann Rider in Treatment: 4 Active Problems ICD-10 Encounter Code Description Active Date Diagnosis E11.622 Type 2 diabetes mellitus with other skin ulcer 12/12/2016 Yes I87.331 Chronic venous hypertension (idiopathic) with ulcer and 12/12/2016 Yes inflammation of right lower extremity I82.401 Acute embolism and thrombosis of unspecified deep veins 12/12/2016 Yes of right lower extremity L03.115 Cellulitis of right lower limb 12/12/2016 Yes Inactive Problems Resolved Problems Electronic Signature(s) Signed: 01/13/2017 8:07:42 AM By: Christin Fudge MD, FACS Entered By: Christin Fudge on 01/13/2017 08:07:42 Gasco[place], [name] [name] ([id number]) -------------------------------------------------------------------------------- Progress Note Details Patient Name: [name] [name] C. Date of Service: 01/12/2017 3:15 PM Medical Record Number: [id number] Patient Account Number: [id number] Date of Birth/Sex: September 23, 1927 (83 y.o. Female) Treating RN: Montey Hora Primary Care Provider: Ria Bush Other Clinician: Referring Provider: Ria Bush Treating Provider/Extender: Frann Rider in Treatment: 4 Subjective Chief Complaint Information obtained from Patient Patient presents for treatment of an open ulcer due  to venous insufficiency, associated redness and swelling History of Present Illness (HPI) The following HPI elements were documented for the patient's wound: Location: swelling, redness and ulcer in the right lower extremity for 2 weeks Quality: Patient reports experiencing burning to affected area(s). Severity: Patient states wound are getting worse. Duration: Patient has had the wound for < 2 weeks prior to presenting for treatment Timing: Pain in wound is Intermittent (comes and goes Context: The wound appeared gradually over time Modifying Factors: Consults to this date include:not seen any physician recently Associated Signs and Symptoms: Patient reports having difficulty standing for long periods. 83 year old patient who is known toes from a lengthy previous consultation between May 2016 to May 2017 now returns to [place] for a swelling, redness and pain of her right lower extremity for about 2 weeks. She says she has been wearing her compression stockings and most recently has not been seen by any other physician. No other significant changes in the recent past and no recent admissions to hospital. 12/23/2016 -- -- had a DVT study done today -- IMPRESSION:No evidence of DVT within the right lower extremity. 12/30/16 on evaluation today patient appears to be doing fairly well in regard to her right lateral lower extremity wound. She has been tolerating the dressing changes without complication after this point and the wraps also she has been tolerating. Fortunately she has no power bladder dysfunction no unexpected fevers chills or weight loss at this point. 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 She has really missed being around people. 01/12/2017 -- she returns after 2 weeks and has been using appropriate compression. However she does not have a compression stockings today. ========== Old notes: This is an 83 yrs old female we have been following since  the beginning of January for a wound on the right medial ankle. She had previously had venous ablation of the greater saphenous vein by Dr. Kellie Simmering. She [name] [name] C. ([id number]) came to [place] with erythema of the right leg and subsequently is opened in the medial aspect of the right ankle. She has not been very compliant with compression. We ordered her at juxta lite stocking, however she apparently has not been able to wear it. The area on the medial  aspect of her left ankle has much less induration and erythema. The edema is better controlled. No evidence of infection is noted. 07/18/14; no major change in the condition of this wound. There is still surface slough and probably some drainage. 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 All other veins showed no evidence of DVT, superficial venous thrombosis or incompetence. however she does have manifestations of chronic venous hypertension and I believe we will change her dressing over to Prisma and a Profore light dressing. she sees her primary care doctor regularly and her last hemoglobin A1c has been around 7%.her other comorbidities are hypertension, CABG in 1998, peptic ulcer disease, history of echo with a ejection fraction of 55-60% and grade 1 diastolic dysfunction. past surgical history include coronary artery bypass graft in 1998, cholecystectomy, appendectomy, hand surgery, endovenous ablation of the saphenous vein with laser by Dr. Kellie Simmering in December 2015. 08/29/2014 - she complains of increased calf pain over the past week. removed her compression bandage and pain improved. no fever or chills. Mild clear drainage. 09/04/2014 -- she has not been compliant and has not done a dressing change as we had ordered last week. She always complains and does not keep her wrap on and this past week she has done the  same. She has completed a course of doxycycline. 09/25/2014 -- she has tolerated her dressing all week and has not removed it. She says it's hurting a lot today and would not like any debridement. We will do so as per her wishes. she did get a appointment with the vascular surgeon and sees him on 6/28. 10/03/2014 - She was seen by Dr. Tinnie Gens 09/30/2014. Assessment: I reviewed the previous venous duplex exam study performed in September 2015 which reveals no options for treating this venous ulcer. There is no significant superficial venous reflux to treat with laser ablation.Most recent ABI was 0.91 in the right foot with palpable dorsalis pedis pulse at 3+ Plan: patient needs to return to the [place] wound center and be treated by Dr. Con Memos. No arterial or venous options available from vascular standpoint or treatment of right leg stasis ulcer.Continued dressing changes and compression is the treatment of choice 10/16/2014 -- today will be her first application of Apligraf. 10/23/2014 -- we are going to be able to apply her second day of Apligraf next week. She is complaining of some tenderness in the region of Achilles tendon and this is possibly due to her wrap. 11/06/2014 -- she did not have Apligraf last week because the wound was not looking good but she says she is feeling much better now. 11/13/2014 -- she has been doing well this week and she is here for her second Apligraf. 11/27/2014 -- she is here for her third application of Apligraf 12/12/2014 -- she is here for her fourth application of Apligraf. Also during these past 3 days she had an injury to her left forearm which she caught against the edge of her desk and has a lacerated wound which has gone down to the subcutaneous tissue. 12/26/2014 -- she is here for a last/fifth application of Apligraf. 04/10/2015 -- the patient is here for her first application of Puraply. 04/17/2015 -- the patient is here for her second  application of Puraply. ILLA, ENLOW ([id number]) 04/24/2015 -- her Puraply was not ordered for today and hence he would use  a collagen dressing locally. 05/01/2015 -- the patient is here for her third application of Puraply. 05/08/2015 -- the patient has been doing fine and overall has shown much improvement and is here for a routine visit 05/15/2015 -- she was admitted to the hospital with viral gastroenteritis and acute renal failure with dehydration and has been on supportive care since February 7. She was discharged today and has come for her wound care. Details of her hospital admission and therapy reviewed. 07/28/15; this is a patient I know from previous stays at the [facility] wound care center. I see she was most recently here predominantly for a venous insufficiency ulcer on the right lower medial leg. Apparently she phones our nurses on a regular basis and there was concern generated that she actually had a lower extremity wound. She arrives with a difficult to follow litany of complaints against the various compression garments we have supplied including I believe juxtalite stockings and 2 layer compression. She seems to have settled on wearing just one layer of the 2 layer compression. I'm really not certain at this point what her problem was with the juxtalite stockings 08/17/2015 -- she has been using some silver product on her lower extremity on the right and was concerned about that blackish discoloration. He has no open wounds. She has been wearing some compression stockings which may be the 15-20 mm type. ========== Objective Constitutional Pulse regular. Respirations normal and unlabored. Afebrile. Vitals Time Taken: 3:55 PM, Height: 58 in, Weight: 148 lbs, BMI: 30.9, Temperature: 98.0 F, Pulse: 56 bpm, Respiratory Rate: 16 breaths/min, Blood Pressure: 156/59 mmHg. Eyes Nonicteric. Reactive to light. Ears, Nose, Mouth, and Throat Lips, teeth, and gums WNL.Marland Kitchen Moist  mucosa without lesions. Neck supple and nontender. No palpable supraclavicular or cervical adenopathy. Normal sized without goiter. Respiratory WNL. No retractions.Marland Kitchen [name] [name] C. ([id number]) Cardiovascular Pedal Pulses WNL. No clubbing, cyanosis or edema. Lymphatic No adneopathy. No adenopathy. No adenopathy. Musculoskeletal Adexa without tenderness or enlargement.. Digits and nails w/o clubbing, cyanosis, infection, petechiae, ischemia, or inflammatory conditions.Marland Kitchen Psychiatric Judgement and insight Intact.. No evidence of depression, anxiety, or agitation.. General Notes: the right lower extremity has good control of lymphedema and there is no open wound visible. Integumentary (Hair, Skin) No suspicious lesions. No crepitus or fluctuance. No peri-wound warmth or erythema. No masses.. Wound #4 status is Open. Original cause of wound was Gradually Appeared. The wound is located on the Right,Lateral Lower Leg. The wound measures 0.3cm length x 0.3cm width x 0.1cm depth; 0.071cm^2 area and 0.007cm^3 volume. The wound is limited to skin breakdown. There is no tunneling or undermining noted. There is a large amount of serous drainage noted. The wound margin is indistinct and nonvisible. There is large (67-100%) granulation within the wound bed. There is a small (1-33%) amount of necrotic tissue within the wound bed including Adherent Slough. The periwound skin appearance did not exhibit: Callus, Crepitus, Excoriation, Induration, Rash, Scarring, Dry/Scaly, Maceration, Atrophie Blanche, Cyanosis, Ecchymosis, Hemosiderin Staining, Mottled, Pallor, Rubor, Erythema. Periwound temperature was noted as No Abnormality. The periwound has tenderness on palpation. Assessment Active Problems ICD-10 E11.622 - Type 2 diabetes mellitus with other skin ulcer I87.331 - Chronic venous hypertension (idiopathic) with ulcer and inflammation of right lower extremity I82.401 - Acute embolism and  thrombosis of unspecified deep veins of right lower extremity L03.115 - Cellulitis of right lower limb Plan Kadlec, [name] C. ([id number]) Wound Cleansing: Wound #4 Right,Lateral Lower Leg: Clean wound with Normal Saline. Anesthetic: Wound #4 Right,Lateral Lower  Leg: Topical Lidocaine 4% cream applied to wound bed prior to debridement Primary Wound Dressing: Wound #4 Right,Lateral Lower Leg: Foam Dressing Change Frequency: Wound #4 Right,Lateral Lower Leg: Change dressing every week Follow-up Appointments: Wound #4 Right,Lateral Lower Leg: Return Appointment in 1 week. Edema Control: Wound #4 Right,Lateral Lower Leg: Kerlix and Coban - Right Lower Extremity Elevate legs to the level of the heart and pump ankles as often as possible Additional Orders / Instructions: Wound #4 Right,Lateral Lower Leg: Activity as tolerated after review today I have recommended: 1. Foam and a 2 layer compression with Kerlix and Coban to be left on for the week. 2. Asked her to bring all her compression wraps and stockings so that we can send her with appropriate advice next week 3. elevation and exercise discussed with her in detail 4. 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 Date of Service: 01/12/2017 Medical Record Number: [id number] Patient Account Number: [id number] Date of Birth/Sex: July 17, 1927 (83 y.o. Female) Treating RN: Montey Hora Primary Care Provider: Ria Bush Other Clinician: Referring Provider: Ria Bush Treating Provider/Extender: Frann Rider in Treatment: 4 Diagnosis Coding ICD-10 Codes Code Description E11.622  Type 2 diabetes mellitus with other skin ulcer Chronic venous hypertension (idiopathic) with ulcer and inflammation of right lower I87.331 extremity I82.401 Acute embolism and thrombosis of unspecified deep veins of right lower extremity L03.115 Cellulitis of right lower limb Facility Procedures CPT4 Code: 86761950 Description: 99213 - WOUND CARE VISIT-LEV 3 EST PT Modifier: Quantity: 1 Physician Procedures CPT4: Description Modifier Quantity Code 9326712 99213 - WC PHYS LEVEL 3 - EST PT 1 ICD-10 Description Diagnosis E11.622 Type 2 diabetes mellitus with other skin ulcer I87.331 Chronic venous hypertension (idiopathic) with ulcer and inflammation of right  lower extremity I82.401 Acute embolism and thrombosis of unspecified deep veins of right lower extremity L03.115 Cellulitis of right lower limb Electronic Signature(s) Signed: 01/13/2017 8:11:15 AM By: Christin Fudge MD, FACS Previous Signature: 01/13/2017 7:56:38 AM Version By: Montey Hora Entered By: Christin Fudge on 01/13/2017 08:11:14

## 2017-01-15 NOTE — Progress Notes (Signed)
LIZZETTE, CARBONELL ([id number]) Visit Report for 01/12/2017 Arrival Information Details Patient Name: Sabbagh, [name] Date of Service: 01/12/2017 3:15 PM Medical Record Number: [id number] Patient Account Number: [id number] Date of Birth/Sex: 28-May-1927 (83 y.o. Female) Treating RN: Montey Hora Primary Care Daleiza Bacchi: Ria Bush Other Clinician: Referring Ronte Parker: Ria Bush Treating Elesia Pemberton/Extender: Frann Rider in Treatment: 4 Visit Information History Since Last Visit Added or deleted any medications: No Patient Arrived: Walker Any new allergies or adverse reactions: No Arrival Time: 15:53 Had a fall or experienced change in No Accompanied By: self activities of daily living that may affect Transfer Assistance: None risk of falls: Patient Identification Verified: Yes Signs or symptoms of abuse/neglect since last No Secondary Verification Process Yes visito Completed: Hospitalized since last visit: No Patient Requires Transmission- No Has Dressing in Place as Prescribed: Yes Based Precautions: Has Compression in Place as Prescribed: Yes Patient Has Alerts: Yes Pain Present Now: No Patient Alerts: Patient on Blood Thinner 81mg  aspirin Electronic Signature(s) Signed: 01/13/2017 4:21:56 PM By: Montey Hora Entered By: Montey Hora on 01/12/2017 15:54:12 Mulka, [name] ([id number]) -------------------------------------------------------------------------------- Clinic Level of Care Assessment Details Patient Name: Loveland, Javeah C. Date of Service: 01/12/2017 3:15 PM Medical Record Number: [id number] Patient Account Number: [id number] Date of Birth/Sex: 07-Apr-1927 (83 y.o. Female) Treating RN: Montey Hora Primary Care Rosene Pilling: Ria Bush Other Clinician: Referring Jensyn Shave: Ria Bush Treating Hendryx Ricke/Extender: Frann Rider in Treatment: 4 Clinic Level of Care Assessment Items TOOL 4 Quantity Score []  - Use  when only an EandM is performed on FOLLOW-UP visit 0 ASSESSMENTS - Nursing Assessment / Reassessment X - Reassessment of Co-morbidities (includes updates in patient status) 1 10 X - Reassessment of Adherence to Treatment Plan 1 5 ASSESSMENTS - Wound and Skin Assessment / Reassessment X - Simple Wound Assessment / Reassessment - one wound 1 5 []  - Complex Wound Assessment / Reassessment - multiple wounds 0 []  - Dermatologic / Skin Assessment (not related to wound area) 0 ASSESSMENTS - Focused Assessment []  - Circumferential Edema Measurements - multi extremities 0 []  - Nutritional Assessment / Counseling / Intervention 0 []  - Lower Extremity Assessment (monofilament, tuning fork, pulses) 0 []  - Peripheral Arterial Disease Assessment (using hand held doppler) 0 ASSESSMENTS - Ostomy and/or Continence Assessment and Care []  - Incontinence Assessment and Management 0 []  - Ostomy Care Assessment and Management (repouching, etc.) 0 PROCESS - Coordination of Care X - Simple Patient / Family Education for ongoing care 1 15 []  - Complex (extensive) Patient / Family Education for ongoing care 0 []  - Staff obtains Programmer, systems, Records, Test Results / Process Orders 0 []  - Staff telephones HHA, Nursing Homes / Clarify orders / etc 0 []  - Routine Transfer to another Facility (non-emergent condition) 0 Mesmer, Tallyn C. ([id number]) []  - Routine Hospital Admission (non-emergent condition) 0 []  - New Admissions / Biomedical engineer / Ordering NPWT, Apligraf, etc. 0 []  - Emergency Hospital Admission (emergent condition) 0 X - Simple Discharge Coordination 1 10 []  - Complex (extensive) Discharge Coordination 0 PROCESS - Special Needs []  - Pediatric / Minor Patient Management 0 []  - Isolation Patient Management 0 []  - Hearing / Language / Visual special needs 0 []  - Assessment of Community assistance (transportation, D/C planning, etc.) 0 []  - Additional assistance / Altered mentation 0 []  -  Support Surface(s) Assessment (bed, cushion, seat, etc.) 0 INTERVENTIONS - Wound Cleansing / Measurement X - Simple Wound Cleansing - one wound 1 5 []  - Complex Wound Cleansing - multiple wounds  0 X - Wound Imaging (photographs - any number of wounds) 1 5 []  - Wound Tracing (instead of photographs) 0 X - Simple Wound Measurement - one wound 1 5 []  - Complex Wound Measurement - multiple wounds 0 INTERVENTIONS - Wound Dressings []  - Small Wound Dressing one or multiple wounds 0 []  - Medium Wound Dressing one or multiple wounds 0 X - Large Wound Dressing one or multiple wounds 1 20 []  - Application of Medications - topical 0 []  - Application of Medications - injection 0 INTERVENTIONS - Miscellaneous []  - External ear exam 0 Figler, Lailyn C. ([id number]) []  - Specimen Collection (cultures, biopsies, blood, body fluids, etc.) 0 []  - Specimen(s) / Culture(s) sent or taken to Lab for analysis 0 X - Patient Transfer (multiple staff / Harrel Lemon Lift / Similar devices) 1 10 []  - Simple Staple / Suture removal (25 or less) 0 []  - Complex Staple / Suture removal (26 or more) 0 []  - Hypo / Hyperglycemic Management (close monitor of Blood Glucose) 0 []  - Ankle / Brachial Index (ABI) - do not check if billed separately 0 X - Vital Signs 1 5 Has the patient been seen at the hospital within the last three years: Yes Total Score: 95 Level Of Care: New/Established - Level 3 Electronic Signature(s) Signed: 01/13/2017 4:21:56 PM By: Montey Hora Entered By: Montey Hora on 01/13/2017 07:56:30 Poyser, [name] ([id number]) -------------------------------------------------------------------------------- Encounter Discharge Information Details Patient Name: Decoteau, Anistyn C. Date of Service: 01/12/2017 3:15 PM Medical Record Number: [id number] Patient Account Number: [id number] Date of Birth/Sex: [birth year] (83 y.o. Female) Treating RN: Montey Hora Primary Care Novalee Horsfall: Ria Bush Other  Clinician: Referring Madalene Mickler: Ria Bush Treating Rya Rausch/Extender: Frann Rider in Treatment: 4 Encounter Discharge Information Items Discharge Pain Level: 0 Discharge Condition: Stable Ambulatory Status: Walker Discharge Destination: Home Private Transportation: Auto Accompanied By: son Schedule Follow-up Appointment: Yes Medication Reconciliation completed and No provided to Patient/Care Shirley Bolle: Clinical Summary of Care: Notes Power went out during patient's visit and patient was in the chair, 4 staff members and patient's son assisted patient out of the chair Electronic Signature(s) Signed: 01/13/2017 8:00:15 AM By: Montey Hora Entered By: Montey Hora on 01/13/2017 08:00:15 Bortle, [name] ([id number]) -------------------------------------------------------------------------------- Lower Extremity Assessment Details Patient Name: Pazos, Laveyah C. Date of Service: 01/12/2017 3:15 PM Medical Record Number: [id number] Patient Account Number: [id number] Date of Birth/Sex: [birth year] (83 y.o. Female) Treating RN: Montey Hora Primary Care Kevan Prouty: Ria Bush Other Clinician: Referring Alinah Sheard: Ria Bush Treating Marise Knapper/Extender: Frann Rider in Treatment: 4 Edema Assessment Assessed: [Left: No] [Right: No] E[Left: dema] [Right: :] Calf Left: Right: Point of Measurement: 28 cm From Medial Instep cm cm Ankle Left: Right: Point of Measurement: 9 cm From Medial Instep cm cm Notes Power in the building went out during patient's visit and lower extremity assessment was not completed Electronic Signature(s) Signed: 01/13/2017 7:53:27 AM By: Montey Hora Previous Signature: 01/13/2017 7:52:22 AM Version By: Montey Hora Entered By: Montey Hora on 01/13/2017 07:53:27 Gonterman, Elliyah CMarland Kitchen ([id number]) -------------------------------------------------------------------------------- Multi Wound Chart Details Patient  Name: Grubb, Janah C. Date of Service: 01/12/2017 3:15 PM Medical Record Number: [id number] Patient Account Number: [id number] Date of Birth/Sex: 1927-04-19 (83 y.o. Female) Treating RN: Montey Hora Primary Care Kynli Chou: Ria Bush Other Clinician: Referring Magda Muise: Ria Bush Treating Keli Buehner/Extender: Frann Rider in Treatment: 4 Vital Signs Height(in): 58 Pulse(bpm): 56 Weight(lbs): 148 Blood Pressure 156/59 (mmHg): Body Mass Index(BMI): 31 Temperature(F): 98.0 Respiratory Rate 16 (breaths/min): Photos: [4:No Photos] [N/A:N/A] Wound  Location: [4:Right Lower Leg - Lateral] [N/A:N/A] Wounding Event: [4:Gradually Appeared] [N/A:N/A] Primary Etiology: [4:Diabetic Wound/Ulcer of the Lower Extremity] [N/A:N/A] Comorbid History: [4:Cataracts, Anemia, Arrhythmia, Coronary Artery Disease, Hypertension, Myocardial Infarction, Type II Diabetes, Osteoarthritis] [N/A:N/A] Date Acquired: [4:10/24/2016] [N/A:N/A] Weeks of Treatment: [4:4] [N/A:N/A] Wound Status: [4:Open] [N/A:N/A] Measurements L x W x D 0.3x0.3x0.1 [N/A:N/A] (cm) Area (cm) : [4:0.071] [N/A:N/A] Volume (cm) : [4:0.007] [N/A:N/A] % Reduction in Area: [4:81.90%] [N/A:N/A] % Reduction in Volume: 82.10% [N/A:N/A] Classification: [4:Grade 1] [N/A:N/A] Exudate Amount: [4:Large] [N/A:N/A] Exudate Type: [4:Serous] [N/A:N/A] Exudate Color: [4:amber] [N/A:N/A] Wound Margin: [4:Indistinct, nonvisible] [N/A:N/A] Granulation Amount: [4:Large (67-100%)] [N/A:N/A] Necrotic Amount: [4:Small (1-33%)] [N/A:N/A] Exposed Structures: [4:Fascia: No Fat Layer (Subcutaneous Tissue) Exposed: No] [N/A:N/A] Tendon: No Muscle: No Joint: No Bone: No Limited to Skin Breakdown Epithelialization: Medium (34-66%) N/A N/A Periwound Skin Texture: Excoriation: No N/A N/A Induration: No Callus: No Crepitus: No Rash: No Scarring: No Periwound Skin Maceration: No N/A N/A Moisture: Dry/Scaly: No Periwound Skin  Color: Atrophie Blanche: No N/A N/A Cyanosis: No Ecchymosis: No Erythema: No Hemosiderin Staining: No Mottled: No Pallor: No Rubor: No Temperature: No Abnormality N/A N/A Tenderness on Yes N/A N/A Palpation: Wound Preparation: Ulcer Cleansing: N/A N/A Rinsed/Irrigated with Saline Topical Anesthetic Applied: None Treatment Notes Wound #4 (Right, Lateral Lower Leg) 1. Cleansed with: Clean wound with Normal Saline 4. Dressing Applied: Foam 7. Secured with Other (specify in notes) Notes kerlix, coban wrap, anchor with unna wrap Electronic Signature(s) Signed: 01/13/2017 8:07:48 AM By: Christin Fudge MD, FACS Previous Signature: 01/13/2017 7:55:19 AM Version By: Joanie Coddington ([id number]) Entered By: Christin Fudge on 01/13/2017 08:07:48 Boedecker, [name] ([id number]) -------------------------------------------------------------------------------- [place] Details Patient Name: Panepinto, Pia C. Date of Service: 01/12/2017 3:15 PM Medical Record Number: [id number] Patient Account Number: [id number] Date of Birth/Sex: 1928/01/18 (83 y.o. Female) Treating RN: Montey Hora Primary Care Navon Kotowski: Ria Bush Other Clinician: Referring Nikolai Wilczak: Ria Bush Treating Jarmon Javid/Extender: Frann Rider in Treatment: 4 Active Inactive ` Orientation to the Wound Care Program Nursing Diagnoses: Knowledge deficit related to the wound healing center program Goals: Patient/caregiver will verbalize understanding of the [facility] Program Date Initiated: 12/12/2016 Target Resolution Date: 12/26/2016 Goal Status: Active Interventions: Provide education on orientation to the wound center Notes: ` Venous Leg Ulcer Nursing Diagnoses: Actual venous Insuffiency (use after diagnosis is confirmed) Goals: Verify adequate tissue perfusion prior to therapeutic compression application Date Initiated: 12/12/2016 Target  Resolution Date: 12/12/2016 Goal Status: Active Interventions: Assess peripheral edema status every visit. Notes: ` Wound/Skin Impairment Nursing Diagnoses: Impaired tissue integrity Weyland, Terrilyn C. ([id number]) Goals: Ulcer/skin breakdown will heal within 14 weeks Date Initiated: 12/12/2016 Target Resolution Date: 03/13/2017 Goal Status: Active Interventions: Assess ulceration(s) every visit Notes: Electronic Signature(s) Signed: 01/13/2017 7:55:11 AM By: Montey Hora Entered By: Montey Hora on 01/13/2017 07:55:10 Koelling, [name] ([id number]) -------------------------------------------------------------------------------- Pain Assessment Details Patient Name: Vantol, Estera C. Date of Service: 01/12/2017 3:15 PM Medical Record Number: [id number] Patient Account Number: [id number] Date of Birth/Sex: 17-Apr-1927 (83 y.o. Female) Treating RN: Montey Hora Primary Care Ronelle Smallman: Ria Bush Other Clinician: Referring Monik Lins: Ria Bush Treating Synda Bagent/Extender: Frann Rider in Treatment: 4 Active Problems Location of Pain Severity and Description of Pain Patient Has [name] Yes Site Locations Pain Location: Pain in Ulcers With Dressing Change: Yes Duration of the Pain. Constant / Intermittento Intermittent Pain Management and Medication Current Pain Management: Electronic Signature(s) Signed: 01/13/2017 4:21:56 PM By: Montey Hora Entered By: Montey Hora on 01/12/2017 15:55:34 Voorhies, Adalene C. (  [id number]) -------------------------------------------------------------------------------- Patient/Caregiver Education Details Patient Name: Weingart, Lekesha C. Date of Service: 01/12/2017 3:15 PM Medical Record Number: [id number] Patient Account Number: [id number] Date of Birth/Gender: 10/12/1927 (83 y.o. Female) Treating RN: Montey Hora Primary Care Physician: Ria Bush Other Clinician: Referring Physician: Ria Bush Treating Physician/Extender: Frann Rider in Treatment: 4 Education Assessment Education Provided To: Patient Education Topics Provided Venous: Handouts: Other: bring compression next week Methods: Explain/Verbal Responses: State content correctly Electronic Signature(s) Signed: 01/13/2017 4:21:56 PM By: Montey Hora Entered By: Montey Hora on 01/13/2017 08:00:55 Zurcher, [name] ([id number]) -------------------------------------------------------------------------------- Wound Assessment Details Patient Name: Lutz, Tamiya C. Date of Service: 01/12/2017 3:15 PM Medical Record Number: [id number] Patient Account Number: [id number] Date of Birth/Sex: 17-Dec-1927 (83 y.o. Female) Treating RN: Montey Hora Primary Care Daxter Paule: Ria Bush Other Clinician: Referring Hasten Sweitzer: Ria Bush Treating Seibert Keeter/Extender: Frann Rider in Treatment: 4 Wound Status Wound Number: 4 Primary Diabetic Wound/Ulcer of the Lower Etiology: Extremity Wound Location: Right Lower Leg - Lateral Wound Open Wounding Event: Gradually Appeared Status: Date Acquired: 10/24/2016 Comorbid Cataracts, Anemia, Arrhythmia, Weeks Of Treatment: 4 History: Coronary Artery Disease, Hypertension, Clustered Wound: No Myocardial Infarction, Type II Diabetes, Osteoarthritis Photos Photo Uploaded By: Montey Hora on 01/13/2017 14:51:00 Wound Measurements Length: (cm) 0.3 Width: (cm) 0.3 Depth: (cm) 0.1 Area: (cm) 0.071 Volume: (cm) 0.007 % Reduction in Area: 81.9% % Reduction in Volume: 82.1% Epithelialization: Medium (34-66%) Tunneling: No Undermining: No Wound Description Classification: Grade 1 Foul Odor Afte Wound Margin: Indistinct, nonvisible Slough/Fibrino Exudate Amount: Large Exudate Type: Serous Exudate Color: amber r Cleansing: No Yes Wound Bed Granulation Amount: Large (67-100%) Exposed Structure Necrotic Amount: Small (1-33%) Fascia  Exposed: No Necrotic Quality: Adherent Slough Fat Layer (Subcutaneous Tissue) Exposed: No Silber, Brunella C. ([id number]) Tendon Exposed: No Muscle Exposed: No Joint Exposed: No Bone Exposed: No Limited to Skin Breakdown Periwound Skin Texture Texture Color No Abnormalities Noted: No No Abnormalities Noted: No Callus: No Atrophie Blanche: No Crepitus: No Cyanosis: No Excoriation: No Ecchymosis: No Induration: No Erythema: No Rash: No Hemosiderin Staining: No Scarring: No Mottled: No Pallor: No Moisture Rubor: No No Abnormalities Noted: No Dry / Scaly: No Temperature / Pain Maceration: No Temperature: No Abnormality Tenderness on Palpation: Yes Wound Preparation Ulcer Cleansing: Rinsed/Irrigated with Saline Topical Anesthetic Applied: None Treatment Notes Wound #4 (Right, Lateral Lower Leg) 1. Cleansed with: Clean wound with Normal Saline 4. Dressing Applied: Foam 7. Secured with Other (specify in notes) Notes kerlix, coban wrap, anchor with unna wrap Electronic Signature(s) Signed: 01/13/2017 7:50:38 AM By: Montey Hora Entered By: Montey Hora on 01/13/2017 07:50:38 L[place], [name] ([id number]) -------------------------------------------------------------------------------- Vitals Details Patient Name: Boulier, Kaden C. Date of Service: 01/12/2017 3:15 PM Medical Record Number: [id number] Patient Account Number: [id number] Date of Birth/Sex: 28-Jun-1927 (83 y.o. Female) Treating RN: Montey Hora Primary Care Elliona Doddridge: Ria Bush Other Clinician: Referring Ruffin Lada: Ria Bush Treating Eri Platten/Extender: Frann Rider in Treatment: 4 Vital Signs Time Taken: 15:55 Temperature (F): 98.0 Height (in): 58 Pulse (bpm): 56 Weight (lbs): 148 Respiratory Rate (breaths/min): 16 Body Mass Index (BMI): 30.9 Blood Pressure (mmHg): 156/59 Reference Range: 80 - 120 mg / dl Electronic Signature(s) Signed: 01/13/2017 4:21:56 PM By:  Montey Hora Entered By: Montey Hora on 01/12/2017 15:56:47

## 2017-01-20 ENCOUNTER — Encounter: Payer: Medicare Other | Admitting: Surgery

## 2017-01-20 DIAGNOSIS — I82401 Acute embolism and thrombosis of unspecified deep veins of right lower extremity: Secondary | ICD-10-CM | POA: Diagnosis not present

## 2017-01-20 DIAGNOSIS — L97819 Non-pressure chronic ulcer of other part of right lower leg with unspecified severity: Secondary | ICD-10-CM | POA: Diagnosis not present

## 2017-01-20 DIAGNOSIS — I87331 Chronic venous hypertension (idiopathic) with ulcer and inflammation of right lower extremity: Secondary | ICD-10-CM | POA: Diagnosis not present

## 2017-01-20 DIAGNOSIS — E11622 Type 2 diabetes mellitus with other skin ulcer: Secondary | ICD-10-CM | POA: Diagnosis not present

## 2017-01-20 DIAGNOSIS — I10 Essential (primary) hypertension: Secondary | ICD-10-CM | POA: Diagnosis not present

## 2017-01-20 DIAGNOSIS — I872 Venous insufficiency (chronic) (peripheral): Secondary | ICD-10-CM | POA: Diagnosis not present

## 2017-01-20 DIAGNOSIS — Z951 Presence of aortocoronary bypass graft: Secondary | ICD-10-CM | POA: Diagnosis not present

## 2017-01-20 DIAGNOSIS — L03115 Cellulitis of right lower limb: Secondary | ICD-10-CM | POA: Diagnosis not present

## 2017-01-23 NOTE — Progress Notes (Signed)
[name], [name] ([id number]) Visit Report for 01/20/2017 Arrival Information Details Patient Name: [name], [name] Date of Service: 01/20/2017 2:30 PM Medical Record Number: [id number] Patient Account Number: [id number] Date of Birth/Sex: 05-22-1927 (83 y.o. Female) Treating RN: Montey Hora Primary Care Freda Jaquith: Ria Bush Other Clinician: Referring Nikira Kushnir: Ria Bush Treating Merna Baldi/Extender: Frann Rider in Treatment: 5 Visit Information History Since Last Visit Added or deleted any medications: No Patient Arrived: Walker Any new allergies or adverse reactions: No Arrival Time: 14:39 Had a fall or experienced change in No Accompanied By: self activities of daily living that may affect Transfer Assistance: None risk of falls: Patient Identification Verified: Yes Signs or symptoms of abuse/neglect since last visito No Secondary Verification Process Yes Hospitalized since last visit: No Completed: Has Dressing in Place as Prescribed: Yes Patient Requires Transmission-Based No Has Compression in Place as Prescribed: Yes Precautions: Pain Present Now: No Patient Has Alerts: Yes Patient Alerts: Patient on Blood Thinner 81mg  aspirin Electronic Signature(s) Signed: 01/20/2017 4:04:29 PM By: Montey Hora Entered By: Montey Hora on 01/20/2017 14:39:18 [name], [name] ([id number]) -------------------------------------------------------------------------------- Clinic Level of Care Assessment Details Patient Name: Taplin, Ruby C. Date of Service: 01/20/2017 2:30 PM Medical Record Number: [id number] Patient Account Number: [id number] Date of Birth/Sex: 1927-05-05 (83 y.o. Female) Treating RN: Montey Hora Primary Care Oluwatamilore Starnes: Ria Bush Other Clinician: Referring Ethanjames Fontenot: Ria Bush Treating Tytus Strahle/Extender: Frann Rider in Treatment: 5 Clinic Level of Care Assessment Items TOOL 4 Quantity Score []  - Use when  only an EandM is performed on FOLLOW-UP visit 0 ASSESSMENTS - Nursing Assessment / Reassessment X - Reassessment of Co-morbidities (includes updates in patient status) 1 10 X- 1 5 Reassessment of Adherence to Treatment Plan ASSESSMENTS - Wound and Skin Assessment / Reassessment X - Simple Wound Assessment / Reassessment - one wound 1 5 []  - 0 Complex Wound Assessment / Reassessment - multiple wounds []  - 0 Dermatologic / Skin Assessment (not related to wound area) ASSESSMENTS - Focused Assessment []  - Circumferential Edema Measurements - multi extremities 0 []  - 0 Nutritional Assessment / Counseling / Intervention X- 1 5 Lower Extremity Assessment (monofilament, tuning fork, pulses) []  - 0 Peripheral Arterial Disease Assessment (using hand held doppler) ASSESSMENTS - Ostomy and/or Continence Assessment and Care []  - Incontinence Assessment and Management 0 []  - 0 Ostomy Care Assessment and Management (repouching, etc.) PROCESS - Coordination of Care X - Simple Patient / Family Education for ongoing care 1 15 []  - 0 Complex (extensive) Patient / Family Education for ongoing care []  - 0 Staff obtains Programmer, systems, Records, Test Results / Process Orders []  - 0 Staff telephones HHA, Nursing Homes / Clarify orders / etc []  - 0 Routine Transfer to another Facility (non-emergent condition) []  - 0 Routine Hospital Admission (non-emergent condition) []  - 0 New Admissions / Biomedical engineer / Ordering NPWT, Apligraf, etc. []  - 0 Emergency Hospital Admission (emergent condition) X- 1 10 Simple Discharge Coordination Hulse, Allen C. ([id number]) []  - 0 Complex (extensive) Discharge Coordination PROCESS - Special Needs []  - Pediatric / Minor Patient Management 0 []  - 0 Isolation Patient Management []  - 0 Hearing / Language / Visual special needs []  - 0 Assessment of Community assistance (transportation, D/C planning, etc.) []  - 0 Additional assistance / Altered  mentation []  - 0 Support Surface(s) Assessment (bed, cushion, seat, etc.) INTERVENTIONS - Wound Cleansing / Measurement X - Simple Wound Cleansing - one wound 1 5 []  - 0 Complex Wound Cleansing - multiple wounds X- 1  5 Wound Imaging (photographs - any number of wounds) []  - 0 Wound Tracing (instead of photographs) X- 1 5 Simple Wound Measurement - one wound []  - 0 Complex Wound Measurement - multiple wounds INTERVENTIONS - Wound Dressings []  - Small Wound Dressing one or multiple wounds 0 []  - 0 Medium Wound Dressing one or multiple wounds []  - 0 Large Wound Dressing one or multiple wounds []  - 0 Application of Medications - topical []  - 0 Application of Medications - injection INTERVENTIONS - Miscellaneous []  - External ear exam 0 []  - 0 Specimen Collection (cultures, biopsies, blood, body fluids, etc.) []  - 0 Specimen(s) / Culture(s) sent or taken to Lab for analysis []  - 0 Patient Transfer (multiple staff / Civil Service fast streamer / Similar devices) []  - 0 Simple Staple / Suture removal (25 or less) []  - 0 Complex Staple / Suture removal (26 or more) []  - 0 Hypo / Hyperglycemic Management (close monitor of Blood Glucose) []  - 0 Ankle / Brachial Index (ABI) - do not check if billed separately X- 1 5 Vital Signs [name], [name] ([id number]) Has the patient been seen at the hospital within the last three years: Yes Total Score: 70 Level Of Care: New/Established - Level 2 Electronic Signature(s) Signed: 01/20/2017 4:04:29 PM By: Montey Hora Entered By: Montey Hora on 01/20/2017 15:25:06 [name], [name] ([id number]) -------------------------------------------------------------------------------- Encounter Discharge Information Details Patient Name: [name], [name] Date of Service: 01/20/2017 2:30 PM Medical Record Number: [id number] Patient Account Number: [id number] Date of Birth/Sex: June 14, 1927 (83 y.o. Female) Treating RN: Montey Hora Primary Care Gazella Anglin:  Ria Bush Other Clinician: Referring Winnifred Dufford: Ria Bush Treating Astou Lada/Extender: Frann Rider in Treatment: 5 Encounter Discharge Information Items Discharge Pain Level: 0 Discharge Condition: Stable Ambulatory Status: Walker Discharge Destination: Home Transportation: Private Auto Accompanied By: self Schedule Follow-up Appointment: Yes Medication Reconciliation completed and No provided to Patient/Care Priscilla Kirstein: Provided on Clinical Summary of Care: 01/20/2017 Form Type Recipient Paper Patient LL Electronic Signature(s) Signed: 01/20/2017 3:25:41 PM By: Montey Hora Entered By: Montey Hora on 01/20/2017 15:25:40 [name], [name] ([id number]) -------------------------------------------------------------------------------- Lower Extremity Assessment Details Patient Name: Deshazo, Love C. Date of Service: 01/20/2017 2:30 PM Medical Record Number: [id number] Patient Account Number: [id number] Date of Birth/Sex: [birth year] (83 y.o. Female) Treating RN: Montey Hora Primary Care Jenaye Rickert: Ria Bush Other Clinician: Referring Anias Bartol: Ria Bush Treating Yasira Engelson/Extender: Frann Rider in Treatment: 5 Vascular Assessment Pulses: Dorsalis Pedis Palpable: [Right:Yes] Posterior Tibial Extremity colors, hair growth, and conditions: Extremity Color: [Right:Mottled] Hair Growth on Extremity: [Right:No] Temperature of Extremity: [Right:Warm] Capillary Refill: [Right:< 3 seconds] Toe Nail Assessment Left: Right: Thick: Yes Discolored: Yes Deformed: Yes Improper Length and Hygiene: No Electronic Signature(s) Signed: 01/20/2017 4:04:29 PM By: Montey Hora Entered By: Montey Hora on 01/20/2017 14:53:05 [name], [name] ([id number]) -------------------------------------------------------------------------------- Multi Wound Chart Details Patient Name: [name], [name] Date of Service: 01/20/2017 2:30 PM Medical  Record Number: [id number] Patient Account Number: [id number] Date of Birth/Sex: July 01, 1927 (83 y.o. 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] [N/A:N/A] Date Acquired: [4:10/24/2016] [N/A:N/A] Weeks of Treatment: [4:5] [N/A:N/A] Wound Status: [4:Healed - Epithelialized] [N/A:N/A] Measurements L x W x  D [4:0x0x0] [N/A:N/A] (cm) Area (cm) : [4:0] [N/A:N/A] Volume (cm) : [4:0] [N/A:N/A] % Reduction in Area: [4:100.00%] [N/A:N/A] % Reduction in Volume: [4:100.00%] [N/A:N/A] Classification: [4:Grade 1] [N/A:N/A] Periwound Skin Texture: [4:No Abnormalities Noted] [N/A:N/A] Periwound Skin Moisture: [4:No Abnormalities Noted] [N/A:N/A] Periwound Skin Color: [4:No Abnormalities Noted No] [N/A:N/A N/A] Treatment Notes Electronic Signature(s) Signed: 01/20/2017 3:19:44 PM By: Christin Fudge MD, FACS Entered By: Christin Fudge on 01/20/2017 15:19:43 [name], [name] ([id number]) -------------------------------------------------------------------------------- [place] Details Patient Name: [name], [name] Date of Service: 01/20/2017 2:30 PM Medical Record Number: [id number] Patient Account Number: [id number] Date of Birth/Sex: June 12, 1927 (83 y.o. Female) Treating RN: Montey Hora Primary Care Deborah Lazcano: Ria Bush Other Clinician: Referring Mariaceleste Herrera: Ria Bush Treating Elivia Robotham/Extender: Frann Rider in Treatment: 5 Active  Inactive Electronic Signature(s) Signed: 01/20/2017 4:04:29 PM By: Montey Hora Entered By: Montey Hora on 01/20/2017 14:53:26 [name], [name] ([id number]) -------------------------------------------------------------------------------- Pain Assessment Details Patient Name: [name], [name] Date of Service: 01/20/2017 2:30 PM Medical Record Number: [id number] Patient Account Number: [id number] Date of Birth/Sex: 1927/10/13 (83 y.o. Female) Treating RN: Montey Hora Primary Care Yulissa Needham: Ria Bush Other Clinician: Referring Tekela Garguilo: Ria Bush Treating Tyniah Kastens/Extender: Frann Rider in Treatment: 5 Active Problems Location of Pain Severity and Description of Pain Patient Has [name] No Site Locations Pain Management and Medication Current Pain Management: Notes Topical or injectable lidocaine is offered to patient for acute pain when surgical debridement is performed. If needed, Patient is instructed to use over the counter pain medication for the following 24-48 hours after debridement. Wound care MDs do not prescribed pain medications. Patient has chronic pain or uncontrolled pain. Patient has been instructed to make an appointment with their Primary Care Physician for pain management. Electronic Signature(s) Signed: 01/20/2017 4:04:29 PM By: Montey Hora Entered By: Montey Hora on 01/20/2017 14:40:18 [name], [name] ([id number]) -------------------------------------------------------------------------------- Patient/Caregiver Education Details Patient Name: Seiter, Regana C. Date of Service: 01/20/2017 2:30 PM Medical Record Number: [id number] Patient Account Number: [id number] Date of Birth/Gender: 01/04/1928 (83 y.o. Female) Treating RN: Montey Hora Primary Care Physician: Ria Bush Other Clinician: Referring Physician: Ria Bush Treating Physician/Extender: Frann Rider in Treatment: 5 Education  Assessment Education Provided To: Patient Education Topics Provided Venous: Handouts: Other: wear compression daily Methods: Explain/Verbal Responses: State content correctly Electronic Signature(s) Signed: 01/20/2017 4:04:29 PM By: Montey Hora Entered By: Montey Hora on 01/20/2017 15:26:07 Laverdiere, Lilyanah Loletha Grayer ([id number]) -------------------------------------------------------------------------------- Wound Assessment Details Patient Name: Lamartina, Undine C. Date of Service: 01/20/2017 2:30 PM Medical Record Number: [id number] Patient Account Number: [id number] Date of Birth/Sex: May 27, 1927 (83 y.o. Female) Treating RN: Montey Hora Primary Care Delainee Tramel: Ria Bush Other Clinician: Referring Nadia Torr: Ria Bush Treating Amyjo Mizrachi/Extender: Frann Rider in Treatment: 5 Wound Status Wound Number: 4 Primary Diabetic Wound/Ulcer of the Lower Etiology: Extremity Wound Location: Right, Lateral Lower Leg Wound Status: Healed - Epithelialized Wounding Event: Gradually Appeared Date Acquired: 10/24/2016 Weeks Of Treatment: 5 Clustered Wound: No Wound Measurements Length: (cm) 0 % Width: (cm) 0 % Depth: (cm) 0 Area: (cm) 0 Volume: (cm) 0 Reduction in Area: 100% Reduction in Volume: 100% Wound Description Classification: Grade 1 Periwound Skin Texture Texture Color No Abnormalities Noted: No No Abnormalities Noted: No Moisture No Abnormalities Noted: No Electronic Signature(s) Signed: 01/20/2017 4:04:29 PM By: Montey Hora Entered By: Montey Hora on 01/20/2017 14:52:28 Subia, [name] ([id number]) -------------------------------------------------------------------------------- Vitals Details Patient Name: Seigler, Maryclare C. Date of Service: 01/20/2017 2:30 PM Medical Record Number: [id number] Patient Account Number: [id number] Date of Birth/Sex: 1927-10-15 (83  y.o. Female) Treating RN: Montey Hora Primary Care Lashaundra Lehrmann: Ria Bush Other Clinician: Referring Tala Eber: Ria Bush Treating Sumayah Bearse/Extender: Frann Rider in Treatment: 5 Vital Signs Time Taken: 14:40 Temperature (F): 98.5 Height (in): 58 Pulse (bpm): 61 Weight (lbs): 148 Respiratory Rate (breaths/min): 16 Body Mass Index (BMI): 30.9 Blood Pressure (mmHg): 140/50 Reference Range: 80 - 120 mg / dl Electronic Signature(s) Signed: 01/20/2017 4:04:29 PM By: Montey Hora Entered By: Montey Hora on 01/20/2017 14:41:17

## 2017-01-23 NOTE — Progress Notes (Signed)
[name], [name] ([id number]) Visit Report for 01/20/2017 Chief Complaint Document Details Patient Name: [name], [name] Date of Service: 01/20/2017 2:30 PM Medical Record Number: [id number] Patient Account Number: [id number] Date of Birth/Sex: 11-25-1927 (83 y.o. Female) Treating RN: Montey Hora Primary Care Provider: Ria Bush Other Clinician: Referring Provider: Ria Bush Treating Provider/Extender: Frann Rider in Treatment: 5 Information Obtained from: Patient Chief Complaint Patient presents for treatment of an open ulcer due to venous insufficiency, associated redness and swelling Electronic Signature(s) Signed: 01/20/2017 3:19:51 PM By: Christin Fudge MD, FACS Entered By: Christin Fudge on 01/20/2017 15:19:50 Henk, [name] ([id number]) -------------------------------------------------------------------------------- HPI Details Patient Name: [name], [name] Date of Service: 01/20/2017 2:30 PM Medical Record Number: [id number] Patient Account Number: [id number] Date of Birth/Sex: Sep 14, 1927 (83 y.o. Female) Treating RN: Montey Hora Primary Care Provider: Ria Bush Other Clinician: Referring Provider: Ria Bush Treating Provider/Extender: Frann Rider in Treatment: 5 History of Present Illness Location: swelling, redness and ulcer in the right lower extremity for 2 weeks Quality: Patient reports experiencing burning to affected area(s). Severity: Patient states wound are getting worse. Duration: Patient has had the wound for < 2 weeks prior to presenting for treatment Timing: Pain in wound is Intermittent (comes and goes Context: The wound appeared gradually over time Modifying Factors: Consults to this date include:not seen any physician recently Associated Signs and Symptoms: Patient reports having difficulty standing for long periods. HPI Description: 83 year old patient who is known toes from a lengthy previous  consultation between May 2016 to May 2017 now returns to [place] for a swelling, redness and pain of her right lower extremity for about 2 weeks. She says she has been wearing her compression stockings and most recently has not been seen by any other physician. No other significant changes in the recent past and no recent admissions to hospital. 12/23/2016 -- -- had a DVT study done today -- IMPRESSION:No evidence of DVT within the right lower extremity. 12/30/16 on evaluation today patient appears to be doing fairly well in regard to her right lateral lower extremity wound. She has been tolerating the dressing changes without complication after this point and the wraps also she has been tolerating. Fortunately she has no power bladder dysfunction no unexpected fevers chills or weight loss at this point. 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 She has really missed being around people. 01/12/2017 -- she returns after 2 weeks and has been using appropriate compression. However she does not have a compression stockings today. ========== Old notes: This is an 83 yrs old female we have been following since the beginning of January for a wound on the right medial ankle. She had previously had venous ablation of the greater saphenous vein by Dr. Kellie Simmering. She came to [place] with erythema of the right leg and subsequently is opened in the medial aspect of the right ankle. She has not been very compliant with compression. We ordered her at juxta lite stocking, however she apparently has not been able to wear it. The area on the medial aspect of her left ankle has much less induration and erythema. The edema is better controlled. No evidence of infection is noted. 07/18/14; no major change in the condition of this wound. There is still surface slough and probably some drainage. 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  All other veins showed no evidence of DVT, superficial venous thrombosis or incompetence. however she does have manifestations of chronic venous hypertension and I believe we will change her dressing over to Prisma and a Profore light dressing. she sees her primary care doctor regularly and her last hemoglobin A1c has been around 7%.her other comorbidities are hypertension, CABG in 1998, peptic ulcer disease, history of echo with a ejection fraction of 55-60% and grade 1 diastolic dysfunction. past surgical history include coronary artery bypass graft in 1998, cholecystectomy, appendectomy, hand surgery, endovenous ablation of the saphenous vein with laser by Dr. Kellie Simmering in December 2015. 08/29/2014 - she complains of increased calf pain over the past week. removed her compression bandage and pain improved. no fever or chills. Mild clear drainage. 09/04/2014 -- she has not been compliant and has not done a dressing change as we had ordered last week. She always [place], Charmain C. ([id number]) complains and does not keep her wrap on and this past week she has done the same. She has completed a course of doxycycline. 09/25/2014 -- she has tolerated her dressing all week and has not removed it. She says it's hurting a lot today and would not like any debridement. We will do so as per her wishes. she did get a appointment with the vascular surgeon and sees him on 6/28. 10/03/2014 - She was seen by Dr. Tinnie Gens 09/30/2014. Assessment: I reviewed the previous venous duplex exam study performed in September 2015 which reveals no options for treating this venous ulcer. There is no significant superficial venous reflux to treat with laser ablation.Most recent ABI was 0.91 in the right foot with palpable dorsalis pedis pulse at 3+ Plan: patient needs to return to the [facility] wound center and be treated by Dr. Con Memos. No arterial or venous  options available from vascular standpoint or treatment of right leg stasis ulcer.Continued dressing changes and compression is the treatment of choice 10/16/2014 -- today will be her first application of Apligraf. 10/23/2014 -- we are going to be able to apply her second day of Apligraf next week. She is complaining of some tenderness in the region of Achilles tendon and this is possibly due to her wrap. 11/06/2014 -- she did not have Apligraf last week because the wound was not looking good but she says she is feeling much better now. 11/13/2014 -- she has been doing well this week and she is here for her second Apligraf. 11/27/2014 -- she is here for her third application of Apligraf 12/12/2014 -- she is here for her fourth application of Apligraf. Also during these past 3 days she had an injury to her left forearm which she caught against the edge of her desk and has a lacerated wound which has gone down to the subcutaneous tissue. 12/26/2014 -- she is here for a last/fifth application of Apligraf. 04/10/2015 -- the patient is here for her first application of Puraply. 04/17/2015 -- the patient is here for her second application of Puraply. 04/24/2015 -- her Puraply was not ordered for today and hence he would use a collagen dressing locally. 05/01/2015 -- the patient is here for her third application of Puraply. 05/08/2015 -- the patient has been doing fine and overall has shown much improvement and is here for a routine visit 05/15/2015 -- she was admitted to the hospital with viral gastroenteritis and acute renal failure with dehydration and has been on supportive care since February 7. She was discharged today and has come for her  wound care. Details of her hospital admission and therapy reviewed. 07/28/15; this is a patient I know from previous stays at the [facility] wound care center. I see she was most recently here predominantly for a venous insufficiency ulcer on the right lower  medial leg. Apparently she phones our nurses on a regular basis and there was concern generated that she actually had a lower extremity wound. She arrives with a difficult to follow litany of complaints against the various compression garments we have supplied including I believe juxtalite stockings and 2 layer compression. She seems to have settled on wearing just one layer of the 2 layer compression. I'm really not certain at this point what her problem was with the juxtalite stockings 08/17/2015 -- she has been using some silver product on her lower extremity on the right and was concerned about that blackish discoloration. He has no open wounds. She has been wearing some compression stockings which may be the 15-20 mm type. ========== Electronic Signature(s) Signed: 01/20/2017 3:20:00 PM By: Christin Fudge MD, FACS Entered By: Christin Fudge on 01/20/2017 15:20:00 Paye, [name] ([id number]) -------------------------------------------------------------------------------- Physical Exam Details Patient Name: [name], [name] Date of Service: 01/20/2017 2:30 PM Medical Record Number: [id number] Patient Account Number: [id number] Date of Birth/Sex: 1927-12-11 (83 y.o. Female) Treating RN: Montey Hora Primary Care Provider: Ria Bush Other Clinician: Referring Provider: Ria Bush Treating Provider/Extender: Frann Rider in Treatment: 5 Constitutional . Pulse regular. Respirations normal and unlabored. Afebrile. . Eyes Nonicteric. Reactive to light. Ears, Nose, Mouth, and Throat Lips, teeth, and gums WNL.Marland Kitchen Moist mucosa without lesions. Neck supple and nontender. No palpable supraclavicular or cervical adenopathy. Normal sized without goiter. Respiratory WNL. No retractions.. Cardiovascular Pedal Pulses WNL. No clubbing, cyanosis or edema. Lymphatic No adneopathy. No adenopathy. No adenopathy. Musculoskeletal Adexa without tenderness or enlargement..  Digits and nails w/o clubbing, cyanosis, infection, petechiae, ischemia, or inflammatory conditions.. Integumentary (Hair, Skin) No suspicious lesions. No crepitus or fluctuance. No peri-wound warmth or erythema. No masses.Marland Kitchen Psychiatric Judgement and insight Intact.. No evidence of depression, anxiety, or agitation.. Notes the wounds on her right lower extremity completely healed and her lymphedema is well controlled Electronic Signature(s) Signed: 01/20/2017 3:20:33 PM By: Christin Fudge MD, FACS Entered By: Christin Fudge on 01/20/2017 15:20:32 Lopp, [name] ([id number]) -------------------------------------------------------------------------------- Physician Orders Details Patient Name: Marlar, Bijou C. Date of Service: 01/20/2017 2:30 PM Medical Record Number: [id number] Patient Account Number: [id number] Date of Birth/Sex: April 17, 1927 (83 y.o. Female) Treating RN: Montey Hora Primary Care Provider: Ria Bush Other Clinician: Referring Provider: Ria Bush Treating Provider/Extender: Frann Rider in Treatment: 5 Verbal / Phone Orders: No Diagnosis Coding [facility]charge From [facility] Services o Discharge from [place] Signature(s) Signed: 01/20/2017 4:04:29 PM By: Montey Hora Signed: 01/20/2017 4:07:58 PM By: Christin Fudge MD, FACS Entered By: Montey Hora on 01/20/2017 14:53:45 Bran, [name] ([id number]) -------------------------------------------------------------------------------- Problem List Details Patient Name: Pindell, Mickayla C. Date of Service: 01/20/2017 2:30 PM Medical Record Number: [id number] Patient Account Number: [id number] Date of Birth/Sex: 1928/02/19 (83 y.o. Female) Treating RN: Montey Hora Primary Care Provider: Ria Bush Other Clinician: Referring Provider: Ria Bush Treating Provider/Extender: Frann Rider in Treatment: 5 Active Problems ICD-10 Encounter Code Description  Active Date Diagnosis E11.622 Type 2 diabetes mellitus with other skin ulcer 12/12/2016 Yes I87.331 Chronic venous hypertension (idiopathic) with ulcer and 12/12/2016 Yes inflammation of right lower extremity I82.401 Acute embolism and thrombosis of unspecified deep veins of right 12/12/2016 Yes lower extremity L03.115 Cellulitis of right lower  limb 12/12/2016 Yes Inactive Problems Resolved Problems Electronic Signature(s) Signed: 01/20/2017 3:19:36 PM By: Christin Fudge MD, FACS Entered By: Christin Fudge on 01/20/2017 15:19:36 Pursel, [name] ([id number]) -------------------------------------------------------------------------------- Progress Note Details Patient Name: [name], [name] Date of Service: 01/20/2017 2:30 PM Medical Record Number: [id number] Patient Account Number: [id number] Date of Birth/Sex: 11/28/1927 (83 y.o. Female) Treating RN: Montey Hora Primary Care Provider: Ria Bush Other Clinician: Referring Provider: Ria Bush Treating Provider/Extender: Frann Rider in Treatment: 5 Subjective Chief Complaint Information obtained from Patient Patient presents for treatment of an open ulcer due to venous insufficiency, associated redness and swelling History of Present Illness (HPI) The following HPI elements were documented for the patient's wound: Location: swelling, redness and ulcer in the right lower extremity for 2 weeks Quality: Patient reports experiencing burning to affected area(s). Severity: Patient states wound are getting worse. Duration: Patient has had the wound for < 2 weeks prior to presenting for treatment Timing: Pain in wound is Intermittent (comes and goes Context: The wound appeared gradually over time Modifying Factors: Consults to this date include:not seen any physician recently Associated Signs and Symptoms: Patient reports having difficulty standing for long periods. 83 year old patient who is known toes from a  lengthy previous consultation between May 2016 to May 2017 now returns to [place] for a swelling, redness and pain of her right lower extremity for about 2 weeks. She says she has been wearing her compression stockings and most recently has not been seen by any other physician. No other significant changes in the recent past and no recent admissions to hospital. 12/23/2016 -- -- had a DVT study done today -- IMPRESSION:No evidence of DVT within the right lower extremity. 12/30/16 on evaluation today patient appears to be doing fairly well in regard to her right lateral lower extremity wound. She has been tolerating the dressing changes without complication after this point and the wraps also she has been tolerating. Fortunately she has no power bladder dysfunction no unexpected fevers chills or weight loss at this point. 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 She has really missed being around people. 01/12/2017 -- she returns after 2 weeks and has been using appropriate compression. However she does not have a compression stockings today. ========== Old notes: This is an 83 yrs old female we have been following since the beginning of January for a wound on the right medial ankle. She had previously had venous ablation of the greater saphenous vein by Dr. Kellie Simmering. She came to [place] with erythema of the right leg and subsequently is opened in the medial aspect of the right ankle. She has not been very compliant with compression. We ordered her at juxta lite stocking, however she apparently has not been able to wear it. The area on the medial aspect of her left ankle has much less induration and erythema. The edema is better controlled. No evidence of infection is noted. 07/18/14; no major change in the condition of this wound. There is still surface slough and probably some drainage. The patient had a venous duplex study done on 04/02/2014 and at that stage it showed that she  had a chronic thrombus in one of her for proximal gastrocnemius veins. All other veins showed no evidence of DVT, superficial venous thrombosis or incompetence. however she does have manifestations of chronic venous hypertension and I believe we will change her dressing over to Prisma and a Profore light dressing. she sees her primary care  doctor regularly and her last hemoglobin A1c has been around 7%.her other comorbidities are Gundlach, Najla C. ([id number]) hypertension, CABG in 1998, peptic ulcer disease, history of echo with a ejection fraction of 55-60% and grade 1 diastolic dysfunction. past surgical history include coronary artery bypass graft in 1998, cholecystectomy, appendectomy, hand surgery, endovenous ablation of the saphenous vein with laser by Dr. Kellie Simmering in December 2015. 08/29/2014 - she complains of increased calf pain over the past week. removed her compression bandage and pain improved. no fever or chills. Mild clear drainage. 09/04/2014 -- she has not been compliant and has not done a dressing change as we had ordered last week. She always complains and does not keep her wrap on and this past week she has done the same. She has completed a course of doxycycline. 09/25/2014 -- she has tolerated her dressing all week and has not removed it. She says it's hurting a lot today and would not like any debridement. We will do so as per her wishes. she did get a appointment with the vascular surgeon and sees him on 6/28. 10/03/2014 - She was seen by Dr. Tinnie Gens 09/30/2014. Assessment: I reviewed the previous venous duplex exam study performed in September 2015 which reveals no options for treating this venous ulcer. There is no significant superficial venous reflux to treat with laser ablation.Most recent ABI was 0.91 in the right foot with palpable dorsalis pedis pulse at 3+ Plan: patient needs to return to the [place] wound center and be treated by Dr. Con Memos. No arterial or  venous options available from vascular standpoint or treatment of right leg stasis ulcer.Continued dressing changes and compression is the treatment of choice 10/16/2014 -- today will be her first application of Apligraf. 10/23/2014 -- we are going to be able to apply her second day of Apligraf next week. She is complaining of some tenderness in the region of Achilles tendon and this is possibly due to her wrap. 11/06/2014 -- she did not have Apligraf last week because the wound was not looking good but she says she is feeling much better now. 11/13/2014 -- she has been doing well this week and she is here for her second Apligraf. 11/27/2014 -- she is here for her third application of Apligraf 12/12/2014 -- she is here for her fourth application of Apligraf. Also during these past 3 days she had an injury to her left forearm which she caught against the edge of her desk and has a lacerated wound which has gone down to the subcutaneous tissue. 12/26/2014 -- she is here for a last/fifth application of Apligraf. 04/10/2015 -- the patient is here for her first application of Puraply. 04/17/2015 -- the patient is here for her second application of Puraply. 04/24/2015 -- her Puraply was not ordered for today and hence he would use a collagen dressing locally. 05/01/2015 -- the patient is here for her third application of Puraply. 05/08/2015 -- the patient has been doing fine and overall has shown much improvement and is here for a routine visit 05/15/2015 -- she was admitted to the hospital with viral gastroenteritis and acute renal failure with dehydration and has been on supportive care since February 7. She was discharged today and has come for her wound care. Details of her hospital admission and therapy reviewed. 07/28/15; this is a patient I know from previous stays at the [facility] wound care center. I see she was most recently here predominantly for a venous insufficiency ulcer on the right  lower medial leg. Apparently she phones our nurses on a regular basis and there was concern generated that she actually had a lower extremity wound. She arrives with a difficult to follow litany of complaints against the various compression garments we have supplied including I believe juxtalite stockings and 2 layer compression. She seems to have settled on wearing just one layer of the 2 layer compression. I'm really not certain at this point what her problem was with the juxtalite stockings 08/17/2015 -- she has been using some silver product on her lower extremity on the right and was concerned about that blackish discoloration. He has no open wounds. She has been wearing some compression stockings which may be the 15-20 mm type. ========== [name], [name] ([id number]) Objective Constitutional Pulse regular. Respirations normal and unlabored. Afebrile. Vitals Time Taken: 2:40 PM, Height: 58 in, Weight: 148 lbs, BMI: 30.9, Temperature: 98.5 F, Pulse: 61 bpm, Respiratory Rate: 16 breaths/min, Blood Pressure: 140/50 mmHg. Eyes Nonicteric. Reactive to light. Ears, Nose, Mouth, and Throat Lips, teeth, and gums WNL.Marland Kitchen Moist mucosa without lesions. Neck supple and nontender. No palpable supraclavicular or cervical adenopathy. Normal sized without goiter. Respiratory WNL. No retractions.. Cardiovascular Pedal Pulses WNL. No clubbing, cyanosis or edema. Lymphatic No adneopathy. No adenopathy. No adenopathy. Musculoskeletal Adexa without tenderness or enlargement.. Digits and nails w/o clubbing, cyanosis, infection, petechiae, ischemia, or inflammatory conditions.Marland Kitchen Psychiatric Judgement and insight Intact.. No evidence of depression, anxiety, or agitation.. General Notes: the wounds on her right lower extremity completely healed and her lymphedema is well controlled Integumentary (Hair, Skin) No suspicious lesions. No crepitus or fluctuance. No peri-wound warmth or erythema. No  masses.. Wound #4 status is Healed - Epithelialized. Original cause of wound was Gradually Appeared. The wound is located on the Right,Lateral Lower Leg. The wound measures 0cm length x 0cm width x 0cm depth; 0cm^2 area and 0cm^3 volume. Assessment Active Problems ICD-10 E11.622 - Type 2 diabetes mellitus with other skin ulcer I87.331 - Chronic venous hypertension (idiopathic) with ulcer and inflammation of right lower extremity I82.401 - Acute embolism and thrombosis of unspecified deep veins of right lower extremity L03.115 - Cellulitis of right lower limb [name], [name] ([id number]) Plan Discharge From [facility] Services: Discharge from [place] She has forgotten to bring her compression stockings with her and hence we will put a foam with some Kerlix on the recently healed wound and I have asked her to wear her compression stocking as soon as she gets home. She is encouraged to wear compression stockings daily, all day except for bedtime. Electronic Signature(s) Signed: 01/20/2017 3:22:07 PM By: Christin Fudge MD, FACS Entered By: Christin Fudge on 01/20/2017 15:22:07 Porrata, [name] ([id number]) -------------------------------------------------------------------------------- SuperBill Details Patient Name: [name], [name] Date of Service: 01/20/2017 Medical Record Number: [id number] Patient Account Number: [id number] Date of Birth/Sex: Sep 30, 1927 (83 y.o. Female) Treating RN: Montey Hora Primary Care Provider: Ria Bush Other Clinician: Referring Provider: Ria Bush Treating Provider/Extender: Frann Rider in Treatment: 5 Diagnosis Coding ICD-10 Codes Code Description E11.622 Type 2 diabetes mellitus with other skin ulcer I87.331 Chronic venous hypertension (idiopathic) with ulcer and inflammation of right lower extremity I82.401 Acute embolism and thrombosis of unspecified deep veins of right lower extremity L03.115 Cellulitis of right lower  limb Facility Procedures CPT4 Code: [id number] Description: 67341 - WOUND CARE VISIT-LEV 2 EST PT Modifier: Quantity: 1 Physician Procedures CPT4: Description Modifier Quantity Code [id number] 09735 - WC PHYS LEVEL 2 - EST PT 1 ICD-10 Diagnosis Description E11.622 Type 2  diabetes mellitus with other skin ulcer I87.331 Chronic venous hypertension (idiopathic) with ulcer and inflammation of right  lower extremity Electronic Signature(s) Signed: 01/20/2017 3:25:17 PM By: Montey Hora Signed: 01/20/2017 4:07:58 PM By: Christin Fudge MD, FACS Previous Signature: 01/20/2017 3:22:38 PM Version By: Christin Fudge MD, FACS Previous Signature: 01/20/2017 3:22:23 PM Version By: Christin Fudge MD, FACS Entered By: Montey Hora on 01/20/2017 15:25:17

## 2017-01-25 ENCOUNTER — Ambulatory Visit (INDEPENDENT_AMBULATORY_CARE_PROVIDER_SITE_OTHER): Payer: Medicare Other | Admitting: Endocrinology

## 2017-01-25 ENCOUNTER — Encounter: Payer: Self-pay | Admitting: Endocrinology

## 2017-01-25 VITALS — BP 134/68 | HR 62 | Ht <= 58 in | Wt 146.0 lb

## 2017-01-25 DIAGNOSIS — E119 Type 2 diabetes mellitus without complications: Secondary | ICD-10-CM

## 2017-01-25 DIAGNOSIS — I2583 Coronary atherosclerosis due to lipid rich plaque: Secondary | ICD-10-CM | POA: Diagnosis not present

## 2017-01-25 DIAGNOSIS — I2510 Atherosclerotic heart disease of native coronary artery without angina pectoris: Secondary | ICD-10-CM

## 2017-01-25 LAB — POCT GLYCOSYLATED HEMOGLOBIN (HGB A1C): Hemoglobin A1C: 6.3

## 2017-01-25 LAB — GLUCOSE, POCT (MANUAL RESULT ENTRY): POC GLUCOSE: 81 mg/dL (ref 70–99)

## 2017-01-25 NOTE — Progress Notes (Signed)
[name], female   DOB: 17-Jul-1927, 83 y.o.   MRN: [id number]   Reason for Appointment: Diabetes follow-up   History of Present Illness   Diagnosis: Type 2 DIABETES MELITUS, date of diagnosis: 1991   She has had mild diabetes for several years and has been on Actos only since about 2001 Her A1c has generally been around or under 7% She was given a trial of metformin because of her difficulty with weight loss and also edema but she felt that it made her blood sugars go up and she will not try it again Also she thinks her blood sugars were higher with a trial of Januvia  RECENT history:  She is currently taking Actos 30 mg daily since 01/2014  Again she does not bring her monitor She thinks her blood sugar is periodically over 200 especially after meals However she is mostly taking after breakfast and she is eating a large meal with cereal, toast and eggs along with fruit and coffee in her sugar  A1c has generally been near 7% and now 6.3 She is somewhat active with doing housework Her weight is down slightly     Oral hypoglycemic drugs: Actos 30 mg daily        Side effects from medications: None Monitors blood glucose: ?  Frequency .    Glucometer: One Touch.           Blood Glucose readings from recall: rarely 250, pc Bf 109-119  Hypoglycemia frequency: Never.           Meals: 2  meals per day usually Physical activity:  minimal, is able to get drawn mostly with walker            Dietician visit: Most recent: Several years ago             Wt Readings from Last 3 Encounters:  01/25/17 146 lb (66.2 kg)  01/09/17 146 lb 4 oz (66.3 kg)  12/08/16 148 lb (67.1 kg)   Lab Results  Component Value Date   HGBA1C 6.6 10/26/2016   HGBA1C 6.8 04/27/2016   HGBA1C 6.8 12/30/2015   Lab Results  Component Value Date   MICROALBUR <0.7 04/27/2016   LDLCALC 56 12/30/2015   CREATININE 1.05 10/26/2016     Allergies as of 01/25/2017      Reactions     Amoxicillin-pot Clavulanate Other (See Comments)   Reaction:  Unknown    Metformin And Related Diarrhea      Medication List       Accurate as of 01/25/17  2:59 PM. Always use your most recent med list.          acetaminophen 500 MG tablet Commonly known as:  TYLENOL Take 500 mg by mouth every 6 (six) hours as needed for mild pain.   acidophilus Caps capsule Take 1 capsule by mouth daily.   ALPRAZolam 1 MG tablet Commonly known as:  XANAX Take 0.5 mg by mouth 4 (four) times daily as needed for anxiety.   aspirin EC 81 MG tablet Take 81 mg by mouth daily.   DEXILANT 30 MG capsule Generic drug:  Dexlansoprazole TAKE 1 CAPSULE (30 MG TOTAL) BY MOUTH DAILY.   diclofenac sodium 1 % Gel Commonly known as:  VOLTAREN Apply 1 application topically 3 (three) times daily.   ferrous sulfate 325 (65 FE) MG tablet Take 1 tablet (325 mg total) by mouth daily with breakfast.   furosemide 20 MG tablet  Commonly known as:  LASIX TAKE 0.5 TABLETS (10 MG TOTAL) BY MOUTH DAILY.   glucosamine-chondroitin 500-400 MG tablet Take 1 tablet by mouth daily.   glucose blood test strip Commonly known as:  ONE TOUCH ULTRA TEST USE AS INSTRUCTED TO CHECK BLOOD SUGAR ONCE A DAY Dx code: E11.9   HYDROcodone-acetaminophen 5-325 MG tablet Commonly known as:  NORCO/VICODIN Take 0.5-1 tablets by mouth every 6 (six) hours as needed for moderate pain.   loperamide 2 MG capsule Commonly known as:  IMODIUM Take 2 mg by mouth 4 (four) times daily as needed for diarrhea or loose stools.   metoprolol tartrate 25 MG tablet Commonly known as:  LOPRESSOR TAKE 1.5 TABLETS (37.5 MG TOTAL) BY MOUTH 2 (TWO) TIMES DAILY.   multivitamin tablet Take 1 tablet by mouth daily. Patient reports takes a new vitamin to replace her centrum as recommended by her eye doctor.   naproxen sodium 220 MG tablet Commonly known as:  ANAPROX Take 220 mg by mouth daily after breakfast.   nitroGLYCERIN 0.4 MG SL  tablet Commonly known as:  NITROSTAT Place 0.4 mg under the tongue every 5 (five) minutes as needed for chest pain.   nystatin-triamcinolone ointment Commonly known as:  MYCOLOG APPLY 1 APPLICATION TOPICALLY 2 (TWO) TIMES DAILY. AS NEEDED FOR ITCHING   pioglitazone 30 MG tablet Commonly known as:  ACTOS TAKE 1 TABLET BY MOUTH EVERY DAY   pravastatin 20 MG tablet Commonly known as:  PRAVACHOL TAKE 1 TABLET BY MOUTH DAILY.   ramipril 5 MG capsule Commonly known as:  ALTACE TAKE ONE CAPSULE BY EVERYDAY FOR BLOOD PRESSURE   traZODone 150 MG tablet Commonly known as:  DESYREL Take 150 mg by mouth at bedtime.   triamcinolone cream 0.1 % Commonly known as:  KENALOG APPLY 1 APPLICATION TOPICALLY 2 (TWO) TIMES DAILY.   vitamin B-12 1000 MCG tablet Commonly known as:  CYANOCOBALAMIN Take 1,000 mcg by mouth daily.   vitamin E 400 UNIT capsule Take 400 Units by mouth daily.       Allergies:  Allergies  Allergen Reactions  . Amoxicillin-Pot Clavulanate Other (See Comments)    Reaction:  Unknown   . Metformin And Related Diarrhea    Past Medical History:  Diagnosis Date  . Acute deep vein thrombosis (DVT) of distal vein of right lower extremity ([place]) 12/2013   Acute thrombus of R proximal gastrocnemius vein. Symptomatic provoked distal DVT  . Anxiety   . Cellulitis of leg, left 01/16/2014  . Chronic back pain 03/2012   lumbar DDD with herniation - s/p L S1 transforaminal ESI (10/2012 Dr. Sharlet Salina)  . Chronic venous insufficiency   . Coronary artery disease    CABG 1998  . Cystocele   . Depression   . Diabetes mellitus    type 2, followed by endo.  . Diastolic dysfunction   . Diverticulosis   . Hemorrhoids   . History of heart attack   . Hx of echocardiogram    a. Echo 10/13: mild LVH, EF 55-60%, Gr 1 diast dysfn, PASP 32  . Hyperlipidemia   . Hypertension   . Irritable bowel syndrome   . Microcytic anemia   . PAD (peripheral artery disease) ([place]) 2013   ABI: R  0.91, L 0.83  . PUD (peptic ulcer disease)   . Varicose veins   . Venous ulcer of leg ([place]) 12/12/2013   Required prolonged treatment by wound center  . Viral gastroenteritis due to [place] virus 05/13/2015   hospitalization  Past Surgical History:  Procedure Laterality Date  . ABDOMINAL HYSTERECTOMY  1975   TAH,BSO  . ABI  2013   R 0.91, L 0.83  . APPENDECTOMY     per pt report  . CARDIOVASCULAR STRESS TEST  2015   low risk myoview ([facility]ser)  . CATARACT SURGERY    . CHOLECYSTECTOMY    . COLONOSCOPY  02/2010   extensive diverticulosis throughout, small internal hemorrhoids, rec rpt 5 yrs (Dr. Allyn Kenner)  . CORONARY ARTERY BYPASS GRAFT  1998  . dexa scan  09/2011   femur -0.8, forearm 0.6 => normal  . ENDOVENOUS ABLATION SAPHENOUS VEIN W/ LASER Left 03/2014   Kellie Simmering  . ESI  2014   L S1 transforaminal ESI x2 (Chasnis) without improvement  . HAND SURGERY    . lexiscan myoview  03/2011   Small basal inferolateral and anterolateral reversible perfusion defect suggests ischemia.  EF was normal.   old infarct, no new ischemia  . toe surgery    . TYMPANOPLASTY  1960s   R side  . Vault prolapse A & P repair  2009   Dr Rhodia [facility]    Family History  Problem Relation Age of Onset  . Hypertension Mother   . Stroke Mother   . Diabetes Father   . Diabetes Sister   . Diabetes Brother   . Colon cancer Neg Hx     Social History:  reports that she quit smoking about 37 years ago. She has never used smokeless tobacco. She reports that she does not drink alcohol or use drugs.  Review of Systems:  She is Consistently complaining of fatigue and low stamina but is still able to do a lot of housework  Lab Results  Component Value Date   TSH 2.41 10/26/2016   TSH 2.71 12/30/2015   TSH 1.82 10/31/2014   [name]4 0.91 10/26/2016     HYPERTENSION:  well-controlled on Ramipril5 mg  Also takes Lasix 20 mg for edema daily prescribed by PCP    Lab Results  Component Value  Date   CREATININE 1.05 10/26/2016   BUN 25 (H) 10/26/2016   NA 137 10/26/2016   K 4.8 10/26/2016   CL 103 10/26/2016   CO2 31 10/26/2016     HYPERLIPIDEMIA: The lipid abnormality consists of elevated LDL treated with pravastatin low-dose, followed by Cardiologist/PCP.  Lab Results  Component Value Date   CHOL 118 12/30/2015   HDL 45.40 12/30/2015   LDLCALC 56 12/30/2015   TRIG 80.0 12/30/2015   CHOLHDL 3 12/30/2015    Last foot exam in 10/2016      Examination:   BP 134/68   Pulse 62   Ht 4\' 9"  (1.448 m)   Wt 146 lb (66.2 kg)   SpO2 94%   BMI 31.59 kg/m   Body mass index is 31.59 kg/m.    no lower leg edema present  She has prominent superficial veins several skin scars and minimal erythema on the right lower leg, not suggestive of cellulitis  ASSESSMENT/ PLAN:   The patient's diabetes control is appearing fairly good with  A1c down to 6.3 now Considering her age and duration of diabetes her level of control is fairly good She is taking Actos without edema or heart failure  She does not bring her monitor but she reports good readings even after breakfast when she sometimes has more carbohydrates  To continue Actos 30 mg once daily   May potentially benefit from low-dose Jardiance if blood  sugars start going up  Will  see her back in 6 months  Right leg redness: Discussed that this is not cellulitis and she will continue to follow-up with PCP and request PCP to give prescription for her elastic stockings    Patient Instructions  Check blood sugars on waking up  weekly  Also check blood sugars about 2 hours after a meal and do this after different meals by rotation  Recommended blood sugar levels on waking up is 90-130 and about 2 hours after meal is 130-160  Please bring your blood sugar monitor to each visit, thank you   Total visit time = 15 minutes  Jyra Lagares 01/25/2017, 2:59 PM      Note: This office note was prepared with Dragon voice  recognition system technology. Any transcriptional errors that result from this process are unintentional.

## 2017-01-25 NOTE — Patient Instructions (Signed)
Check blood sugars on waking up  weekly  Also check blood sugars about 2 hours after a meal and do this after different meals by rotation  Recommended blood sugar levels on waking up is 90-130 and about 2 hours after meal is 130-160  Please bring your blood sugar monitor to each visit, thank you  

## 2017-01-26 ENCOUNTER — Ambulatory Visit: Payer: Medicare Other | Admitting: Endocrinology

## 2017-01-26 ENCOUNTER — Ambulatory Visit: Payer: Medicare Other | Admitting: Surgery

## 2017-01-31 ENCOUNTER — Other Ambulatory Visit: Payer: Self-pay | Admitting: Family Medicine

## 2017-02-02 ENCOUNTER — Other Ambulatory Visit: Payer: Self-pay | Admitting: Cardiovascular Disease

## 2017-02-02 ENCOUNTER — Telehealth: Payer: Self-pay | Admitting: Family Medicine

## 2017-02-02 MED ORDER — NITROGLYCERIN 0.4 MG SL SUBL: 0.4000 mg | SUBLINGUAL_TABLET | SUBLINGUAL | 3 refills | Status: AC | PRN

## 2017-02-02 NOTE — Telephone Encounter (Signed)
Rx was sent to pharmacy. Attempted to inform pt. No answer, no vm.

## 2017-02-02 NOTE — Telephone Encounter (Signed)
Will Dr. Darnell Level refill the Nitroglycerin if it was prescribed by a different provider?  Thanks.

## 2017-02-02 NOTE — Telephone Encounter (Signed)
Copied from [place] [id number]. Topic: Quick Communication - See Telephone Encounter >> Feb 02, 2017 11:57 AM Ahmed Prima L wrote: CRM for notification. See Telephone encounter for:  02/02/17. Patient is wanting to know if DR G will refill the NITROGLYCERIN for her, she had it before prescribed by a diff doctor.

## 2017-02-06 ENCOUNTER — Other Ambulatory Visit: Payer: Self-pay | Admitting: Gynecology

## 2017-02-06 NOTE — Telephone Encounter (Signed)
Spoke with pt relaying message per Dr. Juluis Pitch her thanks to Dr. Darnell Level.

## 2017-02-09 ENCOUNTER — Other Ambulatory Visit: Payer: Self-pay | Admitting: Family Medicine

## 2017-02-10 ENCOUNTER — Encounter: Payer: Self-pay | Admitting: Cardiovascular Disease

## 2017-02-13 ENCOUNTER — Ambulatory Visit (INDEPENDENT_AMBULATORY_CARE_PROVIDER_SITE_OTHER): Payer: Medicare Other | Admitting: Podiatry

## 2017-02-13 ENCOUNTER — Encounter: Payer: Self-pay | Admitting: Podiatry

## 2017-02-13 DIAGNOSIS — M79675 Pain in left toe(s): Secondary | ICD-10-CM | POA: Diagnosis not present

## 2017-02-13 DIAGNOSIS — B351 Tinea unguium: Secondary | ICD-10-CM

## 2017-02-13 DIAGNOSIS — I2510 Atherosclerotic heart disease of native coronary artery without angina pectoris: Secondary | ICD-10-CM

## 2017-02-13 DIAGNOSIS — I2583 Coronary atherosclerosis due to lipid rich plaque: Secondary | ICD-10-CM

## 2017-02-13 DIAGNOSIS — E1149 Type 2 diabetes mellitus with other diabetic neurological complication: Secondary | ICD-10-CM

## 2017-02-13 DIAGNOSIS — L84 Corns and callosities: Secondary | ICD-10-CM

## 2017-02-13 DIAGNOSIS — I8393 Asymptomatic varicose veins of bilateral lower extremities: Secondary | ICD-10-CM

## 2017-02-13 DIAGNOSIS — M79674 Pain in right toe(s): Secondary | ICD-10-CM

## 2017-02-14 NOTE — Progress Notes (Signed)
Subjective: 83 y.o. returns the office today for painful, elongated, thickened toenails which she cannot trim herself. Denies any redness or drainage around the nails. She also continues to get a painful callus to the ball of her right foot which is still going on. She also has varicose veins and is asking for compression stockings. Denies any open sores.  Denies any drainage or pus.Denies any acute changes since last appointment and no new complaints today. Denies any systemic complaints such as fevers, chills, nausea, vomiting.   Objective: AAO 3, NAD DP/PT pulses palpable 1/4, CRT less than 3 seconds Protective sensation decreased with Simms Weinstein monofilament, Achilles tendon reflex intact.  Nails hypertrophic, dystrophic, elongated, brittle, discolored 10. There is tenderness overlying the nails 1-5 bilaterally. There is no surrounding erythema or drainage along the nail sites. Hyperkeratic lesion to the right foot sub met 2. Upon debridement, no underlying ulceration, drainage, or signs of infection.  Prominent metatarsal head plantar. Hammertoes present.  Varicose veins are present.  No open lesions or pre-ulcerative lesions are identified. No other areas of tenderness bilateral lower extremities. No overlying edema, erythema, increased warmth. No pain with calf compression, swelling, warmth, erythema.  Assessment: Patient presents with symptomatic onychomycosis, hyperkeratotic lesion  Plan: -Treatment options including alternatives, risks, complications were discussed -Prescription provided today for compression stockings -Hyperkeratotic lesion sharply debrided x 1 without complications or bleeding. -Nails sharply debrided 10 without complication/bleeding. -Awaiting diabetic shoes.  -Discussed daily foot inspection. If there are any changes, to call the office immediately.  -Follow-up in 3 months or sooner if any problems are to arise. In the meantime, encouraged to call the  office with any questions, concerns, changes symptoms.  Celesta Gentile, DPM

## 2017-02-20 ENCOUNTER — Encounter: Payer: Self-pay | Admitting: Family Medicine

## 2017-02-20 ENCOUNTER — Ambulatory Visit (INDEPENDENT_AMBULATORY_CARE_PROVIDER_SITE_OTHER): Payer: Medicare Other | Admitting: Family Medicine

## 2017-02-20 VITALS — BP 102/54 | HR 67 | Temp 97.9°F | Wt 147.0 lb

## 2017-02-20 DIAGNOSIS — H6122 Impacted cerumen, left ear: Secondary | ICD-10-CM | POA: Diagnosis not present

## 2017-02-20 DIAGNOSIS — I2583 Coronary atherosclerosis due to lipid rich plaque: Secondary | ICD-10-CM

## 2017-02-20 DIAGNOSIS — I2510 Atherosclerotic heart disease of native coronary artery without angina pectoris: Secondary | ICD-10-CM

## 2017-02-20 NOTE — Progress Notes (Signed)
Subjective:    [name], female    DOB: [birth year], 83 y.o.   MRN: [id number]  HPI This is an 83 yo female who presents today with cerumen impaction. She was recently evaluated for a hearing aid and was told her ears were stopped up and she would be unable to be fitted or tested until wax was removed. She doesn't really want hearing aids, doesn't think she has a problem, but her boyfriend and their roommate tell her she is hard of hearing. 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  She denies any pain or fever, no cough/SOB or chest pain.   Past Medical History:  Diagnosis Date  . Acute deep vein thrombosis (DVT) of distal vein of right lower extremity ([place]) 12/2013   Acute thrombus of R proximal gastrocnemius vein. Symptomatic provoked distal DVT  . Anxiety   . Cellulitis of leg, left 01/16/2014  . Chronic back pain 03/2012   lumbar DDD with herniation - s/p L S1 transforaminal ESI (10/2012 Dr. Sharlet Salina)  . Chronic venous insufficiency   . Coronary artery disease    CABG 1998  . Cystocele   . Depression   . Diabetes mellitus    type 2, followed by endo.  . Diastolic dysfunction   . Diverticulosis   . Hemorrhoids   . History of heart attack   . Hx of echocardiogram    a. Echo 10/13: mild LVH, EF 55-60%, Gr 1 diast dysfn, PASP 32  . Hyperlipidemia   . Hypertension   . Irritable bowel syndrome   . Microcytic anemia   . PAD (peripheral artery disease) ([place]) 2013   ABI: R 0.91, L 0.83  . PUD (peptic ulcer disease)   . Varicose veins   . Venous ulcer of leg ([place]) 12/12/2013   Required prolonged treatment by wound center  . Viral gastroenteritis due to [place] virus 05/13/2015   hospitalization   Past Surgical History:  Procedure Laterality Date  . ABDOMINAL HYSTERECTOMY  1975   TAH,BSO  . ABI  2013   R 0.91, L 0.83  . APPENDECTOMY     per pt report  . CARDIOVASCULAR STRESS TEST  2015   low risk myoview ([facility]ser)  .  CATARACT SURGERY    . CHOLECYSTECTOMY    . COLONOSCOPY  02/2010   extensive diverticulosis throughout, small internal hemorrhoids, rec rpt 5 yrs (Dr. Allyn Kenner)  . CORONARY ARTERY BYPASS GRAFT  1998  . dexa scan  09/2011   femur -0.8, forearm 0.6 => normal  . ENDOVENOUS ABLATION SAPHENOUS VEIN W/ LASER Left 03/2014   Kellie Simmering  . ESI  2014   L S1 transforaminal ESI x2 (Chasnis) without improvement  . HAND SURGERY    . lexiscan myoview  03/2011   Small basal inferolateral and anterolateral reversible perfusion defect suggests ischemia.  EF was normal.   old infarct, no new ischemia  . toe surgery    . TYMPANOPLASTY  1960s   R side  . Vault prolapse A & P repair  2009   Dr Rhodia [facility]right [facility]   Family History  Problem Relation Age of Onset  . Hypertension Mother   . Stroke Mother   . Diabetes Father   . Diabetes Sister   . Diabetes Brother   . Colon cancer Neg Hx    Social History   Tobacco Use  . Smoking status: Former Smoker    Last attempt  to quit: 03/05/1979    Years since quitting: 38.0  . Smokeless tobacco: Never Used  Substance Use Topics  . Alcohol use: No    Alcohol/week: 0.0 oz  . Drug use: No      Review of Systems Per HPI    Objective:   Physical Exam  Constitutional: She is oriented to person, place, and time. She appears well-developed and well-nourished. No distress.  HENT:  Head: Normocephalic and atraumatic.  Right Ear: External ear normal.  Left Ear: Tympanic membrane, external ear and ear canal normal.  Right ear without excessive cerumen, TM visualized and normal.  Left ear canal occluded with cerumen. Drops instilled and left ear irrigated with water. [place] cerumen returned, but able to visualize TM which was normal.   Neurological: She is alert and oriented to person, place, and time.  Skin: Skin is warm and dry. She is not diaphoretic.  Psychiatric: She has a normal mood and affect. Her behavior is normal. Judgment and thought content  normal.  Vitals reviewed.     BP (!) 102/54 (BP Location: Left Arm, Patient Position: Sitting, Cuff Size: Normal)   Pulse 67   Temp 97.9 F (36.6 C) (Oral)   Wt 147 lb (66.7 kg)   SpO2 97%   BMI 31.81 kg/m  BP Readings from Last 3 Encounters:  02/21/17 (!) 132/56  02/20/17 (!) 102/54  01/25/17 134/68   Wt Readings from Last 3 Encounters:  02/21/17 147 lb 12.8 oz (67 kg)  02/20/17 147 lb (66.7 kg)  01/25/17 146 lb (66.2 kg)       Assessment & Plan:  1. Impacted cerumen of left ear - irrigated with some improvement, discussed using OTC drops 3x/ week - Provided written and verbal information regarding diagnosis and treatment.  Clarene Reamer, FNP-BC  [facility]  02/24/2017 9:42 AM

## 2017-02-20 NOTE — Patient Instructions (Signed)
Please get an over the counter ear wax softener and put in each ear 3 times a week   Earwax Buildup, Adult The ears produce a substance called earwax that helps keep bacteria out of the ear and protects the skin in the ear canal. Occasionally, earwax can build up in the ear and cause discomfort or hearing loss. What increases the risk? This condition is more likely to develop in people who:  Are female.  Are elderly.  Naturally produce more earwax.  Clean their ears often with cotton swabs.  Use earplugs often.  Use in-ear headphones often.  Wear hearing aids.  Have narrow ear canals.  Have earwax that is overly thick or sticky.  Have eczema.  Are dehydrated.  Have excess hair in the ear canal.  What are the signs or symptoms? Symptoms of this condition include:  Reduced or muffled hearing.  A feeling of fullness in the ear or feeling that the ear is plugged.  Fluid coming from the ear.  Ear pain.  Ear itch.  Ringing in the ear.  Coughing.  An obvious piece of earwax that can be seen inside the ear canal.  How is this diagnosed? This condition may be diagnosed based on:  Your symptoms.  Your medical history.  An ear exam. During the exam, your health care provider will look into your ear with an instrument called an otoscope.  You may have tests, including a hearing test. How is this treated? This condition may be treated by:  Using ear drops to soften the earwax.  Having the earwax removed by a health care provider. The health care provider may: ? Flush the ear with water. ? Use an instrument that has a loop on the end (curette). ? Use a suction device.  Surgery to remove the wax buildup. This may be done in severe cases.  Follow these instructions at home:  Take over-the-counter and prescription medicines only as told by your health care provider.  Do not put any objects, including cotton swabs, into your ear. You can clean the opening of  your ear canal with a washcloth or facial tissue.  Follow instructions from your health care provider about cleaning your ears. Do not over-clean your ears.  Drink enough fluid to keep your urine clear or pale yellow. This will help to thin the earwax.  Keep all follow-up visits as told by your health care provider. If earwax builds up in your ears often or if you use hearing aids, consider seeing your health care provider for routine, preventive ear cleanings. Ask your health care provider how often you should schedule your cleanings.  If you have hearing aids, clean them according to instructions from the manufacturer and your health care provider. Contact a health care provider if:  You have ear pain.  You develop a fever.  You have blood, pus, or other fluid coming from your ear.  You have hearing loss.  You have ringing in your ears that does not go away.  Your symptoms do not improve with treatment.  You feel like the room is spinning (vertigo). Summary  Earwax can build up in the ear and cause discomfort or hearing loss.  The most common symptoms of this condition include reduced or muffled hearing and a feeling of fullness in the ear or feeling that the ear is plugged.  This condition may be diagnosed based on your symptoms, your medical history, and an ear exam.  This condition may be treated by  using ear drops to soften the earwax or by having the earwax removed by a health care provider.  Do not put any objects, including cotton swabs, into your ear. You can clean the opening of your ear canal with a washcloth or facial tissue. This information is not intended to replace advice given to you by your health care provider. Make sure you discuss any questions you have with your health care provider. Document Released: 04/28/2004 Document Revised: 06/01/2016 Document Reviewed: 06/01/2016 Elsevier Interactive Patient Education  [employer].

## 2017-02-21 ENCOUNTER — Ambulatory Visit (INDEPENDENT_AMBULATORY_CARE_PROVIDER_SITE_OTHER): Payer: Medicare Other | Admitting: Cardiovascular Disease

## 2017-02-21 ENCOUNTER — Encounter: Payer: Self-pay | Admitting: Cardiovascular Disease

## 2017-02-21 VITALS — BP 132/56 | HR 72 | Ht <= 58 in | Wt 147.8 lb

## 2017-02-21 DIAGNOSIS — I2510 Atherosclerotic heart disease of native coronary artery without angina pectoris: Secondary | ICD-10-CM

## 2017-02-21 DIAGNOSIS — I2583 Coronary atherosclerosis due to lipid rich plaque: Secondary | ICD-10-CM | POA: Diagnosis not present

## 2017-02-21 DIAGNOSIS — Z951 Presence of aortocoronary bypass graft: Secondary | ICD-10-CM

## 2017-02-21 NOTE — Progress Notes (Signed)
[place] Date of Birth  October 30, 1927 [facility] [address]    [place], [place]  [address] [phone]  Fax  [phone]   Problem list 1. Coronary artery disease-status post CABG in 1996 2. Diabetes mellitus 3. Hypertension 4. Hyperlipidemia 5. Right  DVT.   History of Present Illness:  [name] is an 83 yo female with a Hx of CAD, CABG ( November 09, 1994), diabetes mellitus, anxiety, HTN, hyperlipidemia.   She's been under a lot of stress recently. Her son died last year. She's under a lot of stress with the extra expenses involved such as his funeral. She presented with some CP in December.  A stress myoview revealed lateral ischemia.  We offered cardiac cath but she did not want to have the cath. She presents today without any particular complaints. She has not had any episodes of chest pain or shortness of breath since I saw her a month ago. She does have occasional episodes of tachycardia. She also notes that she has some anxiety. She was last seen in January after having an abnormal stress test that revealed anterior apical ischemia. She refused cardiac catheterization at that time. She has not had any recent episodes of chest pain.  She does not want to have a cardiac catheterization since she's feeling so well.  She has had some chest burning that is nearly constant.  I suggest several times that she should consider getting the cath done and she changed to subject.  September 04, 2012:  [name] is doing well from a cardiac standpoint.  She is still working - despite having lots of back pain.  No CP .  She had some CP several years ago and had a mildly abnormal myoview.  She did not want to have a cardiac cath at that time and has not had any significant epidoses of CP since that time.  She continues to be under lots of stress  She complains of back pain, stomach pain, burning in her legs.    Nov. 24, 2014:  Pt is doing OK from a cardiac standpoint.   Lots of stress - she has been running the [employer] by herself for the past several months.  She is not getting enough rest.  Sleeping 4 1/2 hours at night - tries to get a nap for an hour.    September 09, 2013:  [name] is doing ok.  She is under lots of stress as usual.    Nov. 10, 2015:  [name] presents with various aches and pains.  She has a vericose vein in her leg that bled. She is on Xarelto for a right leg DVT on Sept. 30. .   CT angio a week later was negative for PE.   She has lots of anxiety.  Has lots of chest pain.  The pain is not pleuretic.  The pain is not associated with exercise.  She does not have any pain while she is working but more often occurs with stress.   She is living with 2 elderly men now and has lots of stressful issues relating to that.    This seems to be a major cause of her pain.  She does have 81 year old SVGs  September 23, 2014:  Doing well from a cardiac standpoint. Lots of anxiety .  Has a open right heel ulcer.   Jan. 9, 2017  [name] has been doing ok No CP.  No dyspnea Has had right  ankle pain and swelling  Occasionally has faster than normal heart rate,  No associated dyspnea , CP or dizziness.  Has had an upper respiratory tract infection  Recently   October 19, 2015:  Has had a UTI since I last saw her.  Was in the hospital  Had new A-fib with RVR with the hospitalization  Has converted back to NSR .   Aug 03, 2016:  No serious cardiac symptoms.   Has occasional episodes of tachycardia   February 21, 2018:  Doing well from a cardiac standpoint .   Seen with Granddaughter in [place]. Has some palpitations.  Especially when she gets up too fast.     Current Outpatient Medications on File Prior to Visit  Medication Sig Dispense Refill  . acetaminophen (TYLENOL) 500 MG tablet Take 500 mg by mouth every 6 (six) hours as needed for mild pain.    Marland Kitchen acidophilus (RISAQUAD) CAPS capsule Take 1 capsule by mouth daily.    Marland Kitchen  ALPRAZolam (XANAX) 1 MG tablet Take 0.5 mg by mouth 4 (four) times daily as needed for anxiety.     Marland Kitchen aspirin EC 81 MG tablet Take 81 mg by mouth daily.    Marland Kitchen DEXILANT 30 MG capsule TAKE 1 CAPSULE (30 MG TOTAL) BY MOUTH DAILY. 30 capsule 11  . diclofenac sodium (VOLTAREN) 1 % GEL Apply 1 application topically 3 (three) times daily. 1 Tube 1  . ferrous sulfate 325 (65 FE) MG tablet Take 1 tablet (325 mg total) by mouth daily with breakfast.    . furosemide (LASIX) 20 MG tablet TAKE 0.5 TABLETS (10 MG TOTAL) BY MOUTH DAILY. 45 tablet 3  . glucosamine-chondroitin 500-400 MG tablet Take 1 tablet by mouth daily.     Marland Kitchen glucose blood (ONE TOUCH ULTRA TEST) test strip USE AS INSTRUCTED TO CHECK BLOOD SUGAR ONCE A DAY Dx code: E11.9 50 each 5  . HYDROcodone-acetaminophen (NORCO/VICODIN) 5-325 MG tablet Take 0.5-1 tablets by mouth every 6 (six) hours as needed for moderate pain. 60 tablet 0  . loperamide (IMODIUM) 2 MG capsule Take 2 mg by mouth 4 (four) times daily as needed for diarrhea or loose stools.    . metoprolol tartrate (LOPRESSOR) 25 MG tablet TAKE ONE AND HALF TABLETS (37.5 MG TOTAL) BY MOUTH 2 (TWO) TIMES DAILY. 270 tablet 0  . Multiple Vitamin (MULTIVITAMIN) tablet Take 1 tablet by mouth daily. Patient reports takes a new vitamin to replace her centrum as recommended by her eye doctor.    . nitroGLYCERIN (NITROSTAT) 0.4 MG SL tablet Place 1 tablet (0.4 mg total) under the tongue every 5 (five) minutes as needed for chest pain. 25 tablet 3  . nystatin-triamcinolone ointment (MYCOLOG) APPLY 1 APPLICATION TOPICALLY 2 (TWO) TIMES DAILY. AS NEEDED FOR ITCHING 60 g 3  . pioglitazone (ACTOS) 30 MG tablet TAKE 1 TABLET BY MOUTH EVERY DAY 30 tablet 3  . pravastatin (PRAVACHOL) 20 MG tablet TAKE 1 TABLET BY MOUTH EVERY DAY 30 tablet 3  . ramipril (ALTACE) 5 MG capsule TAKE ONE CAPSULE BY EVERYDAY FOR BLOOD PRESSURE 30 capsule 6  . traZODone (DESYREL) 150 MG tablet Take 150 mg by mouth at bedtime.     .  triamcinolone cream (KENALOG) 0.1 % APPLY 1 APPLICATION TOPICALLY 2 (TWO) TIMES DAILY.  1  . vitamin B-12 (CYANOCOBALAMIN) 1000 MCG tablet Take 1,000 mcg by mouth daily.    . vitamin E 400 UNIT capsule Take 400 Units by mouth daily.  No current facility-administered medications on file prior to visit.     Allergies  Allergen Reactions  . Amoxicillin-Pot Clavulanate Other (See Comments)    Reaction:  Unknown   . Metformin And Related Diarrhea    Past Medical History:  Diagnosis Date  . Acute deep vein thrombosis (DVT) of distal vein of right lower extremity ([place]) 12/2013   Acute thrombus of R proximal gastrocnemius vein. Symptomatic provoked distal DVT  . Anxiety   . Cellulitis of leg, left 01/16/2014  . Chronic back pain 03/2012   lumbar DDD with herniation - s/p L S1 transforaminal ESI (10/2012 Dr. Sharlet Salina)  . Chronic venous insufficiency   . Coronary artery disease    CABG 1998  . Cystocele   . Depression   . Diabetes mellitus    type 2, followed by endo.  . Diastolic dysfunction   . Diverticulosis   . Hemorrhoids   . History of heart attack   . Hx of echocardiogram    a. Echo 10/13: mild LVH, EF 55-60%, Gr 1 diast dysfn, PASP 32  . Hyperlipidemia   . Hypertension   . Irritable bowel syndrome   . Microcytic anemia   . PAD (peripheral artery disease) ([place]) 2013   ABI: R 0.91, L 0.83  . PUD (peptic ulcer disease)   . Varicose veins   . Venous ulcer of leg ([place]) 12/12/2013   Required prolonged treatment by wound center  . Viral gastroenteritis due to [place] virus 05/13/2015   hospitalization    Past Surgical History:  Procedure Laterality Date  . ABDOMINAL HYSTERECTOMY  1975   TAH,BSO  . ABI  2013   R 0.91, L 0.83  . APPENDECTOMY     per pt report  . CARDIOVASCULAR STRESS TEST  2015   low risk myoview (Nahser)  . CATARACT SURGERY    . CHOLECYSTECTOMY    . COLONOSCOPY  02/2010   extensive diverticulosis throughout, small internal hemorrhoids, rec rpt 5 yrs  (Dr. Allyn Kenner)  . CORONARY ARTERY BYPASS GRAFT  1998  . dexa scan  09/2011   femur -0.8, forearm 0.6 => normal  . ENDOVENOUS ABLATION SAPHENOUS VEIN W/ LASER Left 03/2014   Kellie Simmering  . ESI  2014   L S1 transforaminal ESI x2 (Chasnis) without improvement  . HAND SURGERY    . lexiscan myoview  03/2011   Small basal inferolateral and anterolateral reversible perfusion defect suggests ischemia.  EF was normal.   old infarct, no new ischemia  . toe surgery    . TYMPANOPLASTY  1960s   R side  . Vault prolapse A & P repair  2009   Dr Rhodia Albright [facility] hospital    Social History   Tobacco Use  Smoking Status Former Smoker  . Last attempt to quit: 03/05/1979  . Years since quitting: 37.9  Smokeless Tobacco Never Used    Social History   Substance and Sexual Activity  Alcohol Use No  . Alcohol/week: 0.0 oz    Family History  Problem Relation Age of Onset  . Hypertension Mother   . Stroke Mother   . Diabetes Father   . Diabetes Sister   . Diabetes Brother   . Colon cancer Neg Hx     Reviw of Systems:  Reviewed in the HPI.  All other systems are negative.  Physical Exam: Blood pressure (!) 132/56, pulse 72, height 4\' 9"  (1.448 m), weight 147 lb 12.8 oz (67 kg), SpO2 95 %.  GEN:  Well nourished, well  developed in no acute distress HEENT: Normal NECK: No JVD; No carotid bruits LYMPHATICS: No lymphadenopathy CARDIAC: RR, no murmurs, rubs, gallops, she has some chest wall tenderness above her left breast.  RESPIRATORY:  Clear to auscultation without rales, wheezing or rhonchi  ABDOMEN: Soft, non-tender, non-distended MUSCULOSKELETAL:  No edema; No deformity  SKIN: Warm and dry NEUROLOGIC:  Alert and oriented x 3    ECG: February 21, 2017: Normal sinus rhythm at 67.  Assessment / Plan:   1. Coronary artery disease-status post CABG in 1996 - Still seems to be doing well.  She is not having any episodes of angina.  2. Paroxysmal atrial fib :   She is maintaining normal sinus  rhythm.  3. Shortness of breath:       He seems to be breathing without significant shortness of breath.  4. Hypertension -    Blood pressure seems to be well-controlled.  5. Hyperlipidemia We will check lipids at her next visit.   6. Right  DVT.  -   7. Anxiety:   . Marland Kitchen Tends to have episodes of anxiety.   Mertie Moores, MD  02/21/2017 2:31 PM    [address] [place],  [place], [place]  [address] Pager [phone] Phone: [phone]; Fax: [phone]

## 2017-02-21 NOTE — Patient Instructions (Signed)
Medication Instructions:  Your physician recommends that you continue on your current medications as directed. Please refer to the Current Medication list given to you today.   Labwork: Your physician recommends that you return for lab work in: 6 months on the day of or a few days before your office visit with Dr. Nahser.  You will need to FAST for this appointment - nothing to eat or drink after midnight the night before except water.    Testing/Procedures: None Ordered   Follow-Up: Your physician wants you to follow-up in: 6 months with Dr. Nahser.  You will receive a reminder letter in the mail two months in advance. If you don't receive a letter, please call our office to schedule the follow-up appointment.   If you need a refill on your cardiac medications before your next appointment, please call your pharmacy.   Thank you for choosing [facility]! Lilyauna Miedema, RN [phone]    

## 2017-02-22 ENCOUNTER — Telehealth: Payer: Self-pay | Admitting: Family Medicine

## 2017-02-22 NOTE — Telephone Encounter (Signed)
Patient was in the office yesterday and was told to get a oil to use in her ear to help with wax build up-  She is not sure if it is over the counter or a prescription. Can she get a call to let her know what she needs to get. Her number is [phone].

## 2017-02-22 NOTE — Telephone Encounter (Signed)
I spoke with pt and advised from 02/20/17 office note;to get OTC ear wax softener and put in each ear 3 x a week. Pt voiced understanding.

## 2017-02-24 ENCOUNTER — Encounter: Payer: Self-pay | Admitting: Family Medicine

## 2017-02-27 ENCOUNTER — Other Ambulatory Visit: Payer: Self-pay

## 2017-02-27 ENCOUNTER — Telehealth: Payer: Self-pay | Admitting: Family Medicine

## 2017-02-27 MED ORDER — PIOGLITAZONE HCL 30 MG PO TABS: 30.0000 mg | ORAL_TABLET | Freq: Every day | ORAL | 3 refills | Status: DC

## 2017-02-27 NOTE — Telephone Encounter (Signed)
Copied from [place]. Topic: General - Other >> Feb 27, 2017  9:04 AM Oneta Rack wrote: Relation to pt: self  Call back number: Pharmacy: [id number] [place], [place] [phone] (Phone) [phone] (Fax)    Reason for call:  Patient experiencing runny nose, cold like symptoms, patient in need of clinical advice regarding OTC, please advise

## 2017-02-27 NOTE — Telephone Encounter (Signed)
Pt. Called c/o "cold symptoms". Runny nose,sore throat headache, cough with thick "phlegm". Took Coricidin last night. Denies fever. Instructed to drink fluids; drink warm fluids to help with sore throat and congestion. Pt. Will check with her pharmacist about other OTC she can take safely with her medication. Will call back if no better in 5-7 days.

## 2017-02-28 ENCOUNTER — Other Ambulatory Visit: Payer: Self-pay | Admitting: Family Medicine

## 2017-02-28 NOTE — Telephone Encounter (Signed)
Last office visit 02/20/17/acute Last refill 01/09/17 #60

## 2017-02-28 NOTE — Addendum Note (Signed)
Addended by: Briant Cedar on: 02/28/2017 08:48 AM   Modules accepted: Orders

## 2017-02-28 NOTE — Telephone Encounter (Signed)
Needs refill of hydrocodone

## 2017-02-28 NOTE — Telephone Encounter (Signed)
Copied from [place]. Topic: Quick Communication - See Telephone Encounter >> Feb 28, 2017 10:21 AM Bea Graff, NT wrote: CRM for notification. See Telephone encounter for: Patient needing a refill of her hydrocodone.   02/28/17.

## 2017-03-01 MED ORDER — HYDROCODONE-ACETAMINOPHEN 5-325 MG PO TABS: 0.5000 | ORAL_TABLET | Freq: Four times a day (QID) | ORAL | 0 refills | Status: DC | PRN

## 2017-03-01 NOTE — Telephone Encounter (Signed)
Rx needs to be printed.

## 2017-03-01 NOTE — Telephone Encounter (Signed)
Pt calling back to check on rx

## 2017-03-01 NOTE — Telephone Encounter (Signed)
Refilled electronically 

## 2017-03-01 NOTE — Telephone Encounter (Signed)
Spoke with pt notifying her the rx was sent to the pharmacy.  Says she received a call from [facility] letting her know it was ready.

## 2017-03-02 ENCOUNTER — Ambulatory Visit: Payer: Self-pay | Admitting: *Deleted

## 2017-03-02 NOTE — Telephone Encounter (Signed)
Left message on vm per dpr relaying message per Dr. Danise Mina.

## 2017-03-02 NOTE — Telephone Encounter (Signed)
Recommend eval in office if worsening symptoms. plz schedule - i'm not here this afternoon so with another provider if available. Otherwise schedule tomorrow. Thanks.

## 2017-03-02 NOTE — Telephone Encounter (Signed)
Patient has been using OTC medication since Tuesday- not helping her symptoms.  Reason for Disposition . [1] Sinus congestion as part of a cold AND [2] present < 10 days  Answer Assessment - Initial Assessment Questions 1. LOCATION: "Where does it hurt?"      Body aches all over- last Friday- worse now 2. ONSET: "When did the sinus pain start?"  (e.g., hours, days)      Nose is congested, coughing up sputum 3. SEVERITY: "How bad is the pain?"   (Scale 1-10; mild, moderate or severe)   - MILD (1-3): doesn't interfere with normal activities    - MODERATE (4-7): interferes with normal activities (e.g., work or school) or awakens from sleep   - SEVERE (8-10): excruciating pain and patient unable to do any normal activities        5- not resting 4. RECURRENT SYMPTOM: "Have you ever had sinus problems before?" If so, ask: "When was the last time?" and "What happened that time?"      no 5. NASAL CONGESTION: "Is the nose blocked?" If so, ask, "Can you open it or must you breathe through the mouth?"     Not entirely blocked- patient feels miserable 6. NASAL DISCHARGE: "Do you have discharge from your nose?" If so ask, "What color?"     Yellow-gray 7. FEVER: "Do you have a fever?" If so, ask: "What is it, how was it measured, and when did it start?"      98.6 oral- no 8. OTHER SYMPTOMS: "Do you have any other symptoms?" (e.g., sore throat, cough, earache, difficulty breathing)     Slight sore throat, cough,some ear tenderness 9. PREGNANCY: "Is there any chance you are pregnant?" "When was your last menstrual period?"     n/a  Protocols used: SINUS PAIN OR CONGESTION-A-AH

## 2017-03-06 ENCOUNTER — Encounter: Payer: Self-pay | Admitting: Family Medicine

## 2017-03-06 ENCOUNTER — Ambulatory Visit (INDEPENDENT_AMBULATORY_CARE_PROVIDER_SITE_OTHER): Payer: Medicare Other | Admitting: Family Medicine

## 2017-03-06 VITALS — BP 118/62 | HR 84 | Temp 98.0°F | Wt 143.0 lb

## 2017-03-06 DIAGNOSIS — B9689 Other specified bacterial agents as the cause of diseases classified elsewhere: Secondary | ICD-10-CM | POA: Insufficient documentation

## 2017-03-06 DIAGNOSIS — I2510 Atherosclerotic heart disease of native coronary artery without angina pectoris: Secondary | ICD-10-CM | POA: Diagnosis not present

## 2017-03-06 DIAGNOSIS — F4321 Adjustment disorder with depressed mood: Secondary | ICD-10-CM

## 2017-03-06 DIAGNOSIS — J208 Acute bronchitis due to other specified organisms: Secondary | ICD-10-CM

## 2017-03-06 DIAGNOSIS — J069 Acute upper respiratory infection, unspecified: Secondary | ICD-10-CM

## 2017-03-06 DIAGNOSIS — I2583 Coronary atherosclerosis due to lipid rich plaque: Secondary | ICD-10-CM | POA: Diagnosis not present

## 2017-03-06 MED ORDER — BENZONATATE 100 MG PO CAPS: 100.0000 mg | ORAL_CAPSULE | Freq: Two times a day (BID) | ORAL | 0 refills | Status: DC | PRN

## 2017-03-06 NOTE — Patient Instructions (Signed)
You have an upper respiratory infection, likely viral. Lungs sound ok today. You should improve each day.  Push fluids and plenty of rest.  Nasal saline irrigation or neti pot to help drain sinuses - this should help headache.  May continue corcedin and vicks vaporub.  May try tessalon pearls - swallow, don't chew.  Please let us know if fever >101.5, or worsening productive cough. If this happens, let me know.

## 2017-03-06 NOTE — Progress Notes (Signed)
BP 118/62 (BP Location: Left Arm, Patient Position: Sitting, Cuff Size: Normal)   Pulse 84   Temp 98 F (36.7 C) (Oral)   Wt 143 lb (64.9 kg)   SpO2 95%   BMI 30.94 kg/m    CC: cough Subjective:    [name], female    DOB: Apr 05, 1927, 83 y.o.   MRN: [id number]  HPI: [name] is a 83 y.o. female presenting on 03/06/2017 for Cough (Productive with dark mucous. Has had sxs for 10 days. Has taken corcidin D and cough drops) and Diarrhea (Had just yesterday. Took Imodium, resolved sxs.)   Youngest son [name] Hallowell died Feb 15, 2017.  10 d h/o headache, sore throat with PNdrainage, cough productive of dark mucous, trouble sleeping at night due to cough. Headache. Very mild dyspnea. Waking up coughing.   No fever, dyspnea, wheezing.  Has been using vicks vaporizer and corcedin.  Yesterday had diarrhea - improved with immodium.   Needs earwax removed prior to getting set up for hearing aide ([employer]).  Closed work early and has not returned over the past week.   Relevant past medical, surgical, family and social history reviewed and updated as indicated. Interim medical history since our last visit reviewed. Allergies and medications reviewed and updated. Outpatient Medications Prior to Visit  Medication Sig Dispense Refill  . acetaminophen (TYLENOL) 500 MG tablet Take 500 mg by mouth every 6 (six) hours as needed for mild pain.    Marland Kitchen acidophilus (RISAQUAD) CAPS capsule Take 1 capsule by mouth daily.    Marland Kitchen ALPRAZolam (XANAX) 1 MG tablet Take 0.5 mg by mouth 4 (four) times daily as needed for anxiety.     Marland Kitchen aspirin EC 81 MG tablet Take 81 mg by mouth daily.    Marland Kitchen DEXILANT 30 MG capsule TAKE 1 CAPSULE (30 MG TOTAL) BY MOUTH DAILY. 30 capsule 11  . diclofenac sodium (VOLTAREN) 1 % GEL Apply 1 application topically 3 (three) times daily. 1 Tube 1  . ferrous sulfate 325 (65 FE) MG tablet Take 1 tablet (325 mg total) by mouth daily with breakfast.    . furosemide  (LASIX) 20 MG tablet TAKE 0.5 TABLETS (10 MG TOTAL) BY MOUTH DAILY. 45 tablet 3  . glucosamine-chondroitin 500-400 MG tablet Take 1 tablet by mouth daily.     Marland Kitchen glucose blood (ONE TOUCH ULTRA TEST) test strip USE AS INSTRUCTED TO CHECK BLOOD SUGAR ONCE A DAY Dx code: E11.9 50 each 5  . HYDROcodone-acetaminophen (NORCO/VICODIN) 5-325 MG tablet Take 0.5-1 tablets by mouth every 6 (six) hours as needed for moderate pain. 60 tablet 0  . loperamide (IMODIUM) 2 MG capsule Take 2 mg by mouth 4 (four) times daily as needed for diarrhea or loose stools.    . metoprolol tartrate (LOPRESSOR) 25 MG tablet TAKE ONE AND HALF TABLETS (37.5 MG TOTAL) BY MOUTH 2 (TWO) TIMES DAILY. 270 tablet 0  . Multiple Vitamin (MULTIVITAMIN) tablet Take 1 tablet by mouth daily. Patient reports takes a new vitamin to replace her centrum as recommended by her eye doctor.    . nitroGLYCERIN (NITROSTAT) 0.4 MG SL tablet Place 1 tablet (0.4 mg total) under the tongue every 5 (five) minutes as needed for chest pain. 25 tablet 3  . nystatin-triamcinolone ointment (MYCOLOG) APPLY 1 APPLICATION TOPICALLY 2 (TWO) TIMES DAILY. AS NEEDED FOR ITCHING 60 g 3  . pioglitazone (ACTOS) 30 MG tablet Take 1 tablet (30 mg total) by mouth daily. 90 tablet 3  .  pravastatin (PRAVACHOL) 20 MG tablet TAKE 1 TABLET BY MOUTH EVERY DAY 30 tablet 3  . ramipril (ALTACE) 5 MG capsule TAKE ONE CAPSULE BY EVERYDAY FOR BLOOD PRESSURE 30 capsule 6  . traZODone (DESYREL) 150 MG tablet Take 150 mg by mouth at bedtime.     . triamcinolone cream (KENALOG) 0.1 % APPLY 1 APPLICATION TOPICALLY 2 (TWO) TIMES DAILY.  1  . vitamin B-12 (CYANOCOBALAMIN) 1000 MCG tablet Take 1,000 mcg by mouth daily.    . vitamin E 400 UNIT capsule Take 400 Units by mouth daily.     No facility-administered medications prior to visit.      Per HPI unless specifically indicated in ROS section below Review of Systems     Objective:    BP 118/62 (BP Location: Left Arm, Patient Position:  Sitting, Cuff Size: Normal)   Pulse 84   Temp 98 F (36.7 C) (Oral)   Wt 143 lb (64.9 kg)   SpO2 95%   BMI 30.94 kg/m   Wt Readings from Last 3 Encounters:  03/06/17 143 lb (64.9 kg)  02/21/17 147 lb 12.8 oz (67 kg)  02/20/17 147 lb (66.7 kg)    Physical Exam  Constitutional: She appears well-developed and well-nourished. No distress.  HENT:  Head: Normocephalic and atraumatic.  Right Ear: Hearing, external ear and ear canal normal.  Left Ear: Hearing, external ear and ear canal normal.  Nose: Mucosal edema (and congestion) present. No rhinorrhea. Right sinus exhibits maxillary sinus tenderness and frontal sinus tenderness. Left sinus exhibits maxillary sinus tenderness and frontal sinus tenderness.  Mouth/Throat: Uvula is midline, oropharynx is clear and moist and mucous membranes are normal. No oropharyngeal exudate, posterior oropharyngeal edema, posterior oropharyngeal erythema or tonsillar abscesses.  Cerumen bilateral TMs  Eyes: Conjunctivae and EOM are normal. Pupils are equal, round, and reactive to light. No scleral icterus.  Neck: Normal range of motion. Neck supple.  Cardiovascular: Normal rate, regular rhythm, normal heart sounds and intact distal pulses.  No murmur heard. Pulmonary/Chest: Effort normal and breath sounds normal. No respiratory distress. She has no wheezes. She has no rales.  Lungs clear  Lymphadenopathy:    She has no cervical adenopathy.  Skin: Skin is warm and dry. No rash noted.  Nursing note and vitals reviewed.     Assessment & Plan:   Problem List Items Addressed This Visit    Grieving    Son died Jan 29, 2017. Expressed my condolences. Support provided.       Upper respiratory infection, acute - Primary    Anticipate given short duration and recent improvement endorsed by patient. Lungs clear today. Rx tessalon perls, continue other regimen (vicks vaporub, corcedin). She does have significant comorbidities. Low threshold to start abx if any  worsening or not improving as expected. Pt agrees with plan.           Follow up plan: Return if symptoms worsen or fail to improve.  Ria Bush, MD

## 2017-03-06 NOTE — Assessment & Plan Note (Signed)
Anticipate given short duration and recent improvement endorsed by patient. Lungs clear today. Rx tessalon perls, continue other regimen (vicks vaporub, corcedin). She does have significant comorbidities. Low threshold to start abx if any worsening or not improving as expected. Pt agrees with plan.

## 2017-03-06 NOTE — Assessment & Plan Note (Signed)
Son died 01/2017. Expressed my condolences. Support provided.

## 2017-03-08 ENCOUNTER — Ambulatory Visit: Payer: Medicare Other | Admitting: Family Medicine

## 2017-03-20 ENCOUNTER — Telehealth: Payer: Self-pay | Admitting: Family Medicine

## 2017-03-20 ENCOUNTER — Ambulatory Visit: Payer: Self-pay

## 2017-03-20 NOTE — Telephone Encounter (Signed)
Pt. called to report she feels her cough has worsened, since she saw her PCP, 2 weeks. Has been taking tessalon perles,  vicks vaporub, and Corcedin, and noted some improvement for about 4-5 days, but now is worse.  Denied fever.  C/o chills.  Stated she is just not getting better.  Reported she will have episodes of coughing that are hard to control/ stop. Coughing up gray to yellow phlegm. Asking if she is to continue the same medication, or what other recommendations there are.  Appt. given for 03/22/17, as no appt. Avail. On 12/18.  The pt. Stated she plans to call back in AM to try to get a same day appt. Care advice given per protocol.  Verb. Understanding.       Reason for Disposition . [1] Continuous (nonstop) coughing interferes with work or school AND [2] no improvement using cough treatment per Care Advice  Answer Assessment - Initial Assessment Questions 1. ONSET: "When did the cough begin?"      2 weeks ago; some improvement for 4-5 days  2. SEVERITY: "How bad is the cough today?"     Frequent  3. RESPIRATORY DISTRESS: "Describe your breathing."      Feels her breathing continues to be somewhat short.  4. FEVER: "Do you have a fever?" If so, ask: "What is your temperature, how was it measured, and when did it start?"    No fever / c/o chills  5. SPUTUM: "Describe the color of your sputum" (clear, white, yellow, green)    Gray - yellow 6. HEMOPTYSIS: "Are you coughing up any blood?" If so ask: "How much?" (flecks, streaks, tablespoons, etc.)     No 7. CARDIAC HISTORY: "Do you have any history of heart disease?" (e.g., heart attack, congestive heart failure)      Has CAD; hx open heart surgery 8. LUNG HISTORY: "Do you have any history of lung disease?"  (e.g., pulmonary embolus, asthma, emphysema)     No PE, no asthma or emphysema 9. PE RISK FACTORS: "Do you have a history of blood clots?" (or: recent major surgery, recent prolonged travel, bedridden )     N/a  10. OTHER SYMPTOMS:  "Do you have any other symptoms?" (e.g., runny nose, wheezing, chest pain)       Some nasal congestion, no wheezing, denies chest pain   11. PREGNANCY: "Is there any chance you are pregnant?" "When was your last menstrual period?"       no 12. TRAVEL: "Have you traveled out of the country in the last month?" (e.g., travel history, exposures)       no  Protocols used: [place]

## 2017-03-20 NOTE — Telephone Encounter (Signed)
Copied from [place] #[id number]. Topic: Quick Communication - See Telephone Encounter >> Mar 20, 2017  8:38 AM Antonieta Iba C wrote:    CRM for notification. See Telephone encounter for: pt called in to be advised. She was seen and prescribed medication. Pt says that she has completed medication and still is not feeling better. Please assist further.   CB: [phone]  03/20/17.

## 2017-03-22 ENCOUNTER — Ambulatory Visit (INDEPENDENT_AMBULATORY_CARE_PROVIDER_SITE_OTHER): Payer: Medicare Other | Admitting: Family Medicine

## 2017-03-22 ENCOUNTER — Encounter: Payer: Self-pay | Admitting: Family Medicine

## 2017-03-22 ENCOUNTER — Ambulatory Visit: Payer: Medicare Other | Admitting: Primary Care

## 2017-03-22 VITALS — BP 122/60 | HR 73 | Temp 98.7°F | Wt 143.0 lb

## 2017-03-22 DIAGNOSIS — I2510 Atherosclerotic heart disease of native coronary artery without angina pectoris: Secondary | ICD-10-CM | POA: Diagnosis not present

## 2017-03-22 DIAGNOSIS — I2583 Coronary atherosclerosis due to lipid rich plaque: Secondary | ICD-10-CM | POA: Diagnosis not present

## 2017-03-22 DIAGNOSIS — J208 Acute bronchitis due to other specified organisms: Secondary | ICD-10-CM | POA: Diagnosis not present

## 2017-03-22 DIAGNOSIS — B9689 Other specified bacterial agents as the cause of diseases classified elsewhere: Secondary | ICD-10-CM

## 2017-03-22 MED ORDER — AZITHROMYCIN 250 MG PO TABS: ORAL_TABLET | ORAL | 0 refills | Status: DC

## 2017-03-22 MED ORDER — HYDROCODONE-ACETAMINOPHEN 5-325 MG PO TABS: 0.5000 | ORAL_TABLET | Freq: Four times a day (QID) | ORAL | 0 refills | Status: DC | PRN

## 2017-03-22 NOTE — Patient Instructions (Addendum)
You have bronchitis - treat with zpack antibiotic sent to pharmacy.  Push fluids and rest.  Let us know if fever >101 or worsening productive cough or not improving as expected.

## 2017-03-22 NOTE — Progress Notes (Signed)
BP 122/60 (BP Location: Left Arm, Patient Position: Sitting, Cuff Size: Normal)   Pulse 73   Temp 98.7 F (37.1 C) (Oral)   Wt 143 lb (64.9 kg)   SpO2 96%   BMI 30.94 kg/m    CC: cough, nasal congestion Subjective:    [name], female    DOB: Oct 23, 1927, 83 y.o.   MRN: [id number]  HPI: [name] is a 83 y.o. female presenting on 03/22/2017 for Cough (productive. Says cough is not improving, even with meds) and Nasal Congestion   See prior note for details. Seen 03/06/2017 with viral URI, no improvement with conservative treatment of tessalon perls, vicks vaporub, corcedin, cough drops. Feels worse. Persistent productive cough. No fevers. Going into 4th week of illness.   Relevant past medical, surgical, family and social history reviewed and updated as indicated. Interim medical history since our last visit reviewed. Allergies and medications reviewed and updated. Outpatient Medications Prior to Visit  Medication Sig Dispense Refill  . acetaminophen (TYLENOL) 500 MG tablet Take 500 mg by mouth every 6 (six) hours as needed for mild pain.    Marland Kitchen acidophilus (RISAQUAD) CAPS capsule Take 1 capsule by mouth daily.    Marland Kitchen ALPRAZolam (XANAX) 1 MG tablet Take 0.5 mg by mouth 4 (four) times daily as needed for anxiety.     Marland Kitchen aspirin EC 81 MG tablet Take 81 mg by mouth daily.    . benzonatate (TESSALON) 100 MG capsule Take 1 capsule (100 mg total) by mouth 2 (two) times daily as needed for cough. 40 capsule 0  . DEXILANT 30 MG capsule TAKE 1 CAPSULE (30 MG TOTAL) BY MOUTH DAILY. 30 capsule 11  . diclofenac sodium (VOLTAREN) 1 % GEL Apply 1 application topically 3 (three) times daily. 1 Tube 1  . ferrous sulfate 325 (65 FE) MG tablet Take 1 tablet (325 mg total) by mouth daily with breakfast.    . furosemide (LASIX) 20 MG tablet TAKE 0.5 TABLETS (10 MG TOTAL) BY MOUTH DAILY. 45 tablet 3  . glucosamine-chondroitin 500-400 MG tablet Take 1 tablet by mouth daily.     Marland Kitchen  glucose blood (ONE TOUCH ULTRA TEST) test strip USE AS INSTRUCTED TO CHECK BLOOD SUGAR ONCE A DAY Dx code: E11.9 50 each 5  . loperamide (IMODIUM) 2 MG capsule Take 2 mg by mouth 4 (four) times daily as needed for diarrhea or loose stools.    . metoprolol tartrate (LOPRESSOR) 25 MG tablet TAKE ONE AND HALF TABLETS (37.5 MG TOTAL) BY MOUTH 2 (TWO) TIMES DAILY. 270 tablet 0  . Multiple Vitamin (MULTIVITAMIN) tablet Take 1 tablet by mouth daily. Patient reports takes a new vitamin to replace her centrum as recommended by her eye doctor.    . nitroGLYCERIN (NITROSTAT) 0.4 MG SL tablet Place 1 tablet (0.4 mg total) under the tongue every 5 (five) minutes as needed for chest pain. 25 tablet 3  . nystatin-triamcinolone ointment (MYCOLOG) APPLY 1 APPLICATION TOPICALLY 2 (TWO) TIMES DAILY. AS NEEDED FOR ITCHING 60 g 3  . pioglitazone (ACTOS) 30 MG tablet Take 1 tablet (30 mg total) by mouth daily. 90 tablet 3  . pravastatin (PRAVACHOL) 20 MG tablet TAKE 1 TABLET BY MOUTH EVERY DAY 30 tablet 3  . ramipril (ALTACE) 5 MG capsule TAKE ONE CAPSULE BY EVERYDAY FOR BLOOD PRESSURE 30 capsule 6  . traZODone (DESYREL) 150 MG tablet Take 150 mg by mouth at bedtime.     . triamcinolone cream (KENALOG) 0.1 %  APPLY 1 APPLICATION TOPICALLY 2 (TWO) TIMES DAILY.  1  . vitamin B-12 (CYANOCOBALAMIN) 1000 MCG tablet Take 1,000 mcg by mouth daily.    . vitamin E 400 UNIT capsule Take 400 Units by mouth daily.    Marland Kitchen HYDROcodone-acetaminophen (NORCO/VICODIN) 5-325 MG tablet Take 0.5-1 tablets by mouth every 6 (six) hours as needed for moderate pain. 60 tablet 0   No facility-administered medications prior to visit.      Per HPI unless specifically indicated in ROS section below Review of Systems     Objective:    BP 122/60 (BP Location: Left Arm, Patient Position: Sitting, Cuff Size: Normal)   Pulse 73   Temp 98.7 F (37.1 C) (Oral)   Wt 143 lb (64.9 kg)   SpO2 96%   BMI 30.94 kg/m   Wt Readings from Last 3  Encounters:  03/22/17 143 lb (64.9 kg)  03/06/17 143 lb (64.9 kg)  02/21/17 147 lb 12.8 oz (67 kg)    Physical Exam  Constitutional: She appears well-developed and well-nourished. No distress.  HENT:  Head: Normocephalic and atraumatic.  Right Ear: Hearing, tympanic membrane, external ear and ear canal normal.  Left Ear: Hearing, tympanic membrane, external ear and ear canal normal.  Nose: No mucosal edema or rhinorrhea. Right sinus exhibits no maxillary sinus tenderness and no frontal sinus tenderness. Left sinus exhibits no maxillary sinus tenderness and no frontal sinus tenderness.  Mouth/Throat: Uvula is midline, oropharynx is clear and moist and mucous membranes are normal. No oropharyngeal exudate, posterior oropharyngeal edema, posterior oropharyngeal erythema or tonsillar abscesses.  Eyes: Conjunctivae and EOM are normal. Pupils are equal, round, and reactive to light. No scleral icterus.  Neck: Normal range of motion. Neck supple.  Cardiovascular: Normal rate, regular rhythm, normal heart sounds and intact distal pulses.  No murmur heard. Pulmonary/Chest: Effort normal. No respiratory distress. She has decreased breath sounds. She has no wheezes. She has rhonchi (faint bilaterally). She has no rales.  Lymphadenopathy:    She has no cervical adenopathy.  Skin: Skin is warm and dry. No rash noted.  Nursing note and vitals reviewed.     Assessment & Plan:   Problem List Items Addressed This Visit    Acute bacterial bronchitis - Primary    Ongoing into 4 weeks of illness, worsening. Not consistent with PNA> anticipate bacterial or atypical bronchitis - treat with zpack antibiotic. Continue further supportive care as per instructions.       Relevant Medications   azithromycin (ZITHROMAX) 250 MG tablet       Follow up plan: Return if symptoms worsen or fail to improve.  Ria Bush, MD

## 2017-03-22 NOTE — Assessment & Plan Note (Signed)
Ongoing into 4 weeks of illness, worsening. Not consistent with PNA> anticipate bacterial or atypical bronchitis - treat with zpack antibiotic. Continue further supportive care as per instructions.

## 2017-03-22 NOTE — Telephone Encounter (Signed)
Pt has appt today

## 2017-03-27 ENCOUNTER — Telehealth: Payer: Self-pay

## 2017-03-27 NOTE — Telephone Encounter (Signed)
Patient Name: [name] Gender: Unknown DOB: 12-Dec-1927 Age: 83 Y 42 M 17 D Return Phone Number: [phone] (Primary) Address: City/State/Zip: [place] [address] Client [facility] Night - Client Client Site [place] Physician Ria Bush - MD Contact Type Call Who Is Calling Patient / Member / Family / Caregiver Call Type Triage / Clinical Relationship To Patient Self Return Phone Number [phone] (Primary) Chief Complaint Cough Reason for Call Symptomatic / Request for [place] states that she has been taking a zpac for bronchitis. She still feels week and is on her last pill. She has runny nose and coughing up phlegm. Translation No Nurse Assessment Nurse: Mills-Hernandez, RN, [name] Date/Time (Eastern Time): 03/25/2017 4:38:05 PM Confirm and document reason for call. If symptomatic, describe symptoms. ---Caller states that she was put on Z-Pak Wednesday for bronchitis. Tomorrow is the last day of her Z-Pak. She feels like she is not getting any better. Does the patient have any new or worsening symptoms? ---Yes Will a triage be completed? ---Yes Related visit to physician within the last 2 weeks? ---Yes Does the PT have any chronic conditions? (i.e. diabetes, asthma, etc.) ---Yes List chronic conditions. ---HTN Is this a behavioral health or substance abuse call? ---No Guidelines Guideline Title Affirmed Question Affirmed Notes Nurse Date/Time (Eastern Time) Cough - Acute Productive [1] Nasal discharge AND [2] present > 10 days Mills-Hernandez, RN, [name] 03/25/2017 4:41:15 PM Disp. Time Eilene Ghazi Time) Disposition Final User 03/25/2017 4:45:25 PM See PCP When Office is Open (within 3 days) Yes Mills-Hernandez, RN, Cindee Salt Disagree/Comply Comply Caller Understands Yes PLEASE NOTE: All timestamps contained within this report are represented as [place] Standard  Time. CONFIDENTIALTY NOTICE: This fax transmission is intended only for the addressee. It contains information that is legally privileged, confidential or otherwise protected from use or disclosure. If you are not the intended recipient, you are strictly prohibited from reviewing, disclosing, copying using or disseminating any of this information or taking any action in reliance on or regarding this information. If you have received this fax in error, please notify us immediately by telephone so that we can arrange for its return to [place]. Phone: [phone], Toll-Free: [phone], Fax: [phone] Page: 2 of 2 Call Id: [id number] PreDisposition [place] Advice Given Per Guideline SEE PCP WITHIN 3 DAYS: * You need to be seen within 2 or 3 days. Call your doctor during regular office hours and make an appointment. An urgent care center is often the best source of care if your doctor's office is closed or you can't get an appointment. NOTE: If office will be open tomorrow, tell caller to call then, not in 3 days. CALL BACK IF: * Fever over 103 F (39.4 C) * Difficulty breathing occurs * You become worse. CARE ADVICE given per Cough - Acute Productive (Adult) guideline. Comments User: Haroldine Laws, RN Date/Time Eilene Ghazi Time): 03/25/2017 4:45:12 PM Caller states that she won't visit urgent care unless she is dying. Nurse tried to give her additional home care advice that might help but she states she already knows "all that". Referrals GO TO FACILITY REFUSED

## 2017-03-29 ENCOUNTER — Encounter: Payer: Self-pay | Admitting: Family Medicine

## 2017-03-29 ENCOUNTER — Ambulatory Visit (INDEPENDENT_AMBULATORY_CARE_PROVIDER_SITE_OTHER): Payer: Medicare Other | Admitting: Family Medicine

## 2017-03-29 VITALS — BP 118/60 | HR 74 | Temp 98.2°F | Wt 141.0 lb

## 2017-03-29 DIAGNOSIS — E1149 Type 2 diabetes mellitus with other diabetic neurological complication: Secondary | ICD-10-CM | POA: Diagnosis not present

## 2017-03-29 DIAGNOSIS — J208 Acute bronchitis due to other specified organisms: Secondary | ICD-10-CM | POA: Diagnosis not present

## 2017-03-29 DIAGNOSIS — B9689 Other specified bacterial agents as the cause of diseases classified elsewhere: Secondary | ICD-10-CM | POA: Diagnosis not present

## 2017-03-29 DIAGNOSIS — I2510 Atherosclerotic heart disease of native coronary artery without angina pectoris: Secondary | ICD-10-CM

## 2017-03-29 DIAGNOSIS — I2583 Coronary atherosclerosis due to lipid rich plaque: Secondary | ICD-10-CM | POA: Diagnosis not present

## 2017-03-29 MED ORDER — LEVOFLOXACIN 500 MG PO TABS: 500.0000 mg | ORAL_TABLET | Freq: Every day | ORAL | 0 refills | Status: DC

## 2017-03-29 NOTE — Assessment & Plan Note (Signed)
Anticipate persistent sinusitis and bronchitis - will broaden coverage to levaquein 500mg  7d course. Cr should tolerate this. Continue other medication regimen up to now. Update if not improving with treatment. Pt agrees with plan.

## 2017-03-29 NOTE — Progress Notes (Signed)
BP 118/60 (BP Location: Left Arm, Patient Position: Sitting, Cuff Size: Normal)   Pulse 74   Temp 98.2 F (36.8 C) (Oral)   Wt 141 lb (64 kg)   SpO2 95%   BMI 30.51 kg/m    CC: f/u bronchitis Subjective:    [name], female    DOB: 06/02/1927, 83 y.o.   MRN: [id number]  HPI: [name] is a 83 y.o. female presenting on 03/29/2017 for Cough (productive, no improvement after Zpack and Tessalon perles); Fatigue (C/o loss of energy); and Ear Fullness   Here with friend [place].  See prior notes for details.  Seen here 03/22/2017 wit hdx acute bacterial bronchitis given progression of URI sxs from 3 wks ago, treated with zpack antibiotic. Presents today with ongoing symptoms without significant improvement after zpack and tessalon perls. She also continues corcedin.   Persistent productive cough of sputum. Clear rhinorrhea. Muffled hearing. Coughing fits. She does endorse wheezing and dyspnea. Headaches and facial pain. Chest > head congestion.  No fevers/chills, earaches.   Relevant past medical, surgical, family and social history reviewed and updated as indicated. Interim medical history since our last visit reviewed. Allergies and medications reviewed and updated. Outpatient Medications Prior to Visit  Medication Sig Dispense Refill  . acetaminophen (TYLENOL) 500 MG tablet Take 500 mg by mouth every 6 (six) hours as needed for mild pain.    Marland Kitchen acidophilus (RISAQUAD) CAPS capsule Take 1 capsule by mouth daily.    Marland Kitchen ALPRAZolam (XANAX) 1 MG tablet Take 0.5 mg by mouth 4 (four) times daily as needed for anxiety.     Marland Kitchen aspirin EC 81 MG tablet Take 81 mg by mouth daily.    Marland Kitchen DEXILANT 30 MG capsule TAKE 1 CAPSULE (30 MG TOTAL) BY MOUTH DAILY. 30 capsule 11  . diclofenac sodium (VOLTAREN) 1 % GEL Apply 1 application topically 3 (three) times daily. 1 Tube 1  . ferrous sulfate 325 (65 FE) MG tablet Take 1 tablet (325 mg total) by mouth daily with breakfast.    .  furosemide (LASIX) 20 MG tablet TAKE 0.5 TABLETS (10 MG TOTAL) BY MOUTH DAILY. 45 tablet 3  . glucosamine-chondroitin 500-400 MG tablet Take 1 tablet by mouth daily.     Marland Kitchen glucose blood (ONE TOUCH ULTRA TEST) test strip USE AS INSTRUCTED TO CHECK BLOOD SUGAR ONCE A DAY Dx code: E11.9 50 each 5  . HYDROcodone-acetaminophen (NORCO/VICODIN) 5-325 MG tablet Take 0.5-1 tablets by mouth every 6 (six) hours as needed for moderate pain. 60 tablet 0  . loperamide (IMODIUM) 2 MG capsule Take 2 mg by mouth 4 (four) times daily as needed for diarrhea or loose stools.    . metoprolol tartrate (LOPRESSOR) 25 MG tablet TAKE ONE AND HALF TABLETS (37.5 MG TOTAL) BY MOUTH 2 (TWO) TIMES DAILY. 270 tablet 0  . Multiple Vitamin (MULTIVITAMIN) tablet Take 1 tablet by mouth daily. Patient reports takes a new vitamin to replace her centrum as recommended by her eye doctor.    . nitroGLYCERIN (NITROSTAT) 0.4 MG SL tablet Place 1 tablet (0.4 mg total) under the tongue every 5 (five) minutes as needed for chest pain. 25 tablet 3  . nystatin-triamcinolone ointment (MYCOLOG) APPLY 1 APPLICATION TOPICALLY 2 (TWO) TIMES DAILY. AS NEEDED FOR ITCHING 60 g 3  . pioglitazone (ACTOS) 30 MG tablet Take 1 tablet (30 mg total) by mouth daily. 90 tablet 3  . pravastatin (PRAVACHOL) 20 MG tablet TAKE 1 TABLET BY MOUTH EVERY  DAY 30 tablet 3  . ramipril (ALTACE) 5 MG capsule TAKE ONE CAPSULE BY EVERYDAY FOR BLOOD PRESSURE 30 capsule 6  . traZODone (DESYREL) 150 MG tablet Take 150 mg by mouth at bedtime.     . triamcinolone cream (KENALOG) 0.1 % APPLY 1 APPLICATION TOPICALLY 2 (TWO) TIMES DAILY.  1  . vitamin B-12 (CYANOCOBALAMIN) 1000 MCG tablet Take 1,000 mcg by mouth daily.    . vitamin E 400 UNIT capsule Take 400 Units by mouth daily.    Marland Kitchen azithromycin (ZITHROMAX) 250 MG tablet Take two tablets on day one followed by one tablet on days 2-5 6 each 0  . benzonatate (TESSALON) 100 MG capsule Take 1 capsule (100 mg total) by mouth 2 (two)  times daily as needed for cough. 40 capsule 0   No facility-administered medications prior to visit.      Per HPI unless specifically indicated in ROS section below Review of Systems     Objective:    BP 118/60 (BP Location: Left Arm, Patient Position: Sitting, Cuff Size: Normal)   Pulse 74   Temp 98.2 F (36.8 C) (Oral)   Wt 141 lb (64 kg)   SpO2 95%   BMI 30.51 kg/m   Wt Readings from Last 3 Encounters:  03/29/17 141 lb (64 kg)  03/22/17 143 lb (64.9 kg)  03/06/17 143 lb (64.9 kg)    Physical Exam  Constitutional: She appears well-developed and well-nourished. No distress.  HENT:  Head: Normocephalic and atraumatic.  Right Ear: Tympanic membrane, external ear and ear canal normal. Decreased hearing is noted.  Left Ear: Tympanic membrane, external ear and ear canal normal. Decreased hearing is noted.  Nose: Mucosal edema and rhinorrhea present. Right sinus exhibits maxillary sinus tenderness and frontal sinus tenderness. Left sinus exhibits maxillary sinus tenderness and frontal sinus tenderness.  Mouth/Throat: Uvula is midline, oropharynx is clear and moist and mucous membranes are normal. No oropharyngeal exudate, posterior oropharyngeal edema, posterior oropharyngeal erythema or tonsillar abscesses.  Cerumen in bilateral canals Mild sinus discomfort bilaterally  Eyes: Conjunctivae and EOM are normal. Pupils are equal, round, and reactive to light. No scleral icterus.  Neck: Normal range of motion. Neck supple.  Cardiovascular: Normal rate, regular rhythm, normal heart sounds and intact distal pulses.  No murmur heard. Pulmonary/Chest: Effort normal and breath sounds normal. No respiratory distress. She has no wheezes. She has no rales.  Lungs clear today  Lymphadenopathy:    She has no cervical adenopathy.  Skin: Skin is warm and dry. No rash noted.  Nursing note and vitals reviewed.  Results for orders placed or performed in visit on 01/25/17  POCT glucose (manual  entry)  Result Value Ref Range   POC Glucose 81 70 - 99 mg/dl  POCT glycosylated hemoglobin (Hb A1C)  Result Value Ref Range   Hemoglobin A1C 6.3    Lab Results  Component Value Date   CREATININE 1.05 10/26/2016       Assessment & Plan:   Problem List Items Addressed This Visit    Acute bacterial bronchitis - Primary    Anticipate persistent sinusitis and bronchitis - will broaden coverage to levaquein 500mg  7d course. Cr should tolerate this. Continue other medication regimen up to now. Update if not improving with treatment. Pt agrees with plan.       Type 2 diabetes mellitus with neurological manifestations, controlled ([place])       Follow up plan: Return if symptoms worsen or fail to improve.  Ria Bush,  MD

## 2017-03-29 NOTE — Patient Instructions (Signed)
I think you have persistent sinus infection - treat with levaquin antibiotic sent to pharmacy for 1 week.  Continue rest, continue other medications.  Let us know if not improving with treatment.

## 2017-04-05 ENCOUNTER — Telehealth: Payer: Self-pay

## 2017-04-05 ENCOUNTER — Telehealth: Payer: Self-pay | Admitting: Family Medicine

## 2017-04-05 MED ORDER — HYDROCODONE-ACETAMINOPHEN 5-325 MG PO TABS: 0.5000 | ORAL_TABLET | Freq: Four times a day (QID) | ORAL | 0 refills | Status: DC | PRN

## 2017-04-05 NOTE — Telephone Encounter (Signed)
Pt says that she was seen and prescribed a antibiotic for congestion. Pt says that she has completed it and is not feeling better. Pt would like to be advised on what she should do next?    Please advise.

## 2017-04-05 NOTE — Telephone Encounter (Signed)
Copied from [place]. Topic: Appointment Scheduling - Scheduling Inquiry for Clinic >> Apr 05, 2017  4:40 PM [name], NT wrote: Reason for CRM: Pt wants to see if she can be worked in for sinus congestion with Dr. Danise Mina tomorrow? Please contact pt

## 2017-04-05 NOTE — Telephone Encounter (Signed)
plz notify I've sent this in electronically for her. I can now send electronic prescriptions.

## 2017-04-05 NOTE — Telephone Encounter (Signed)
Was treated for sinusitis with levaquin 7d course.  What symptoms is she having? May need to come in for a recheck.

## 2017-04-05 NOTE — Telephone Encounter (Signed)
Copied from [place] [phone]. Topic: Quick Communication - Rx Refill/Question >> Apr 05, 2017 12:13 PM Arletha Grippe wrote: Has the patient contacted their pharmacy? Yes.     (Agent: If no, request that the patient contact the pharmacy for the refill.)   Preferred Pharmacy (with phone number or street name): pt took rx for HYDROcodone-acetaminophen (NORCO/VICODIN) 5-325 MG tablet  to the pharmacy and they told her that is was not signed. Pt did not pick up the paper rx from the pharmacy Pt would like to know what to do.  Please call [phone]    Agent: Please be advised that RX refills may take up to 3 business days. We ask that you follow-up with your pharmacy.

## 2017-04-05 NOTE — Telephone Encounter (Signed)
Called patient unable to leave voicemail on patients home phone please inform patient upon return call.

## 2017-04-05 NOTE — Telephone Encounter (Signed)
Unable to reach pt by phone; no answer and no v/m.

## 2017-04-06 ENCOUNTER — Ambulatory Visit (INDEPENDENT_AMBULATORY_CARE_PROVIDER_SITE_OTHER): Payer: Medicare Other | Admitting: Family Medicine

## 2017-04-06 ENCOUNTER — Encounter: Payer: Self-pay | Admitting: Family Medicine

## 2017-04-06 ENCOUNTER — Telehealth: Payer: Self-pay | Admitting: Family Medicine

## 2017-04-06 VITALS — BP 120/60 | HR 68 | Temp 98.0°F | Wt 142.0 lb

## 2017-04-06 DIAGNOSIS — J208 Acute bronchitis due to other specified organisms: Secondary | ICD-10-CM | POA: Diagnosis not present

## 2017-04-06 DIAGNOSIS — B9689 Other specified bacterial agents as the cause of diseases classified elsewhere: Secondary | ICD-10-CM

## 2017-04-06 DIAGNOSIS — J3489 Other specified disorders of nose and nasal sinuses: Secondary | ICD-10-CM | POA: Diagnosis not present

## 2017-04-06 NOTE — Progress Notes (Signed)
BP 120/60 (BP Location: Right Arm, Patient Position: Sitting, Cuff Size: Normal)   Pulse 68   Temp 98 F (36.7 C) (Oral)   Wt 142 lb (64.4 kg)   SpO2 96%   BMI 30.73 kg/m    CC: nasal congestion Subjective:    [name], female    DOB: Oct 12, 1927, 84 y.o.   MRN: [id number]  HPI: [name] is a 84 y.o. female presenting on 04/06/2017 for Nasal Congestion (Started 04/04/17. Also, c/o ears popping. Says abx helped but runny nose has started)   See recent notes for details. Seen here 3 times last month, last 03/29/2017 with dx acute bacterial bronchitis/sinusitis that had failed zpack so broadened coverage with levaquin 500mg  7d course. She did feel better after this - felt well NYE. Tuesday started with rhinorrhea, hoarse raspy voice and crackles in ears. This morning eyes were matted up. Mild dyspnea.   No fevers/chills, very mild cough, no abd pain, nausea, or wheezing.   Relevant past medical, surgical, family and social history reviewed and updated as indicated. Interim medical history since our last visit reviewed. Allergies and medications reviewed and updated. Outpatient Medications Prior to Visit  Medication Sig Dispense Refill  . acetaminophen (TYLENOL) 500 MG tablet Take 500 mg by mouth every 6 (six) hours as needed for mild pain.    Marland Kitchen acidophilus (RISAQUAD) CAPS capsule Take 1 capsule by mouth daily.    Marland Kitchen ALPRAZolam (XANAX) 1 MG tablet Take 0.5 mg by mouth 4 (four) times daily as needed for anxiety.     Marland Kitchen aspirin EC 81 MG tablet Take 81 mg by mouth daily.    Marland Kitchen DEXILANT 30 MG capsule TAKE 1 CAPSULE (30 MG TOTAL) BY MOUTH DAILY. 30 capsule 11  . diclofenac sodium (VOLTAREN) 1 % GEL Apply 1 application topically 3 (three) times daily. 1 Tube 1  . ferrous sulfate 325 (65 FE) MG tablet Take 1 tablet (325 mg total) by mouth daily with breakfast.    . furosemide (LASIX) 20 MG tablet TAKE 0.5 TABLETS (10 MG TOTAL) BY MOUTH DAILY. 45 tablet 3  .  glucosamine-chondroitin 500-400 MG tablet Take 1 tablet by mouth daily.     Marland Kitchen glucose blood (ONE TOUCH ULTRA TEST) test strip USE AS INSTRUCTED TO CHECK BLOOD SUGAR ONCE A DAY Dx code: E11.9 50 each 5  . HYDROcodone-acetaminophen (NORCO/VICODIN) 5-325 MG tablet Take 0.5-1 tablets by mouth every 6 (six) hours as needed for moderate pain. 60 tablet 0  . loperamide (IMODIUM) 2 MG capsule Take 2 mg by mouth 4 (four) times daily as needed for diarrhea or loose stools.    . metoprolol tartrate (LOPRESSOR) 25 MG tablet TAKE ONE AND HALF TABLETS (37.5 MG TOTAL) BY MOUTH 2 (TWO) TIMES DAILY. 270 tablet 0  . Multiple Vitamin (MULTIVITAMIN) tablet Take 1 tablet by mouth daily. Patient reports takes a new vitamin to replace her centrum as recommended by her eye doctor.    . nitroGLYCERIN (NITROSTAT) 0.4 MG SL tablet Place 1 tablet (0.4 mg total) under the tongue every 5 (five) minutes as needed for chest pain. 25 tablet 3  . nystatin-triamcinolone ointment (MYCOLOG) APPLY 1 APPLICATION TOPICALLY 2 (TWO) TIMES DAILY. AS NEEDED FOR ITCHING 60 g 3  . pioglitazone (ACTOS) 30 MG tablet Take 1 tablet (30 mg total) by mouth daily. 90 tablet 3  . pravastatin (PRAVACHOL) 20 MG tablet TAKE 1 TABLET BY MOUTH EVERY DAY 30 tablet 3  . ramipril (ALTACE) 5  MG capsule TAKE ONE CAPSULE BY EVERYDAY FOR BLOOD PRESSURE 30 capsule 6  . traZODone (DESYREL) 150 MG tablet Take 150 mg by mouth at bedtime.     . triamcinolone cream (KENALOG) 0.1 % APPLY 1 APPLICATION TOPICALLY 2 (TWO) TIMES DAILY.  1  . vitamin B-12 (CYANOCOBALAMIN) 1000 MCG tablet Take 1,000 mcg by mouth daily.    . vitamin E 400 UNIT capsule Take 400 Units by mouth daily.    Marland Kitchen levofloxacin (LEVAQUIN) 500 MG tablet Take 1 tablet (500 mg total) by mouth daily. 7 tablet 0   No facility-administered medications prior to visit.      Per HPI unless specifically indicated in ROS section below Review of Systems     Objective:    BP 120/60 (BP Location: Right Arm,  Patient Position: Sitting, Cuff Size: Normal)   Pulse 68   Temp 98 F (36.7 C) (Oral)   Wt 142 lb (64.4 kg)   SpO2 96%   BMI 30.73 kg/m   Wt Readings from Last 3 Encounters:  04/06/17 142 lb (64.4 kg)  03/29/17 141 lb (64 kg)  03/22/17 143 lb (64.9 kg)    Physical Exam  Constitutional: She appears well-developed and well-nourished. No distress.  HENT:  Head: Normocephalic and atraumatic.  Right Ear: Hearing, tympanic membrane, external ear and ear canal normal.  Left Ear: Hearing, external ear and ear canal normal.  Nose: Mucosal edema (mild) and rhinorrhea present. Right sinus exhibits no maxillary sinus tenderness and no frontal sinus tenderness. Left sinus exhibits no maxillary sinus tenderness and no frontal sinus tenderness.  Mouth/Throat: Uvula is midline, oropharynx is clear and moist and mucous membranes are normal. No oropharyngeal exudate, posterior oropharyngeal edema, posterior oropharyngeal erythema or tonsillar abscesses.  Cerumen in bilateral canals unable to visualize L side  Eyes: Conjunctivae and EOM are normal. Pupils are equal, round, and reactive to light. No scleral icterus.  Neck: Normal range of motion. Neck supple.  Cardiovascular: Normal rate, regular rhythm, normal heart sounds and intact distal pulses.  No murmur heard. Pulmonary/Chest: Effort normal and breath sounds normal. No respiratory distress. She has no wheezes. She has no rales.  Lungs clear  Lymphadenopathy:    She has no cervical adenopathy.  Skin: Skin is warm and dry. No rash noted.  Nursing note and vitals reviewed.  Results for orders placed or performed in visit on 01/25/17  POCT glucose (manual entry)  Result Value Ref Range   POC Glucose 81 70 - 99 mg/dl  POCT glycosylated hemoglobin (Hb A1C)  Result Value Ref Range   Hemoglobin A1C 6.3       Assessment & Plan:   Problem List Items Addressed This Visit    Acute bacterial bronchitis    Resolving well. No signs of ongoing  bacterial infection.       Rhinorrhea - Primary    Ongoing rhinorrhea after recent bacterial bronchitis/sinusitis treated with zpack then levaquin course. No signs of ongoing bacterial infection - rec start oral antihistamine like claritin or allegra. She has f/u appt next week with me.           Follow up plan: Return if symptoms worsen or fail to improve.  Ria Bush, MD

## 2017-04-06 NOTE — Assessment & Plan Note (Signed)
Ongoing rhinorrhea after recent bacterial bronchitis/sinusitis treated with zpack then levaquin course. No signs of ongoing bacterial infection - rec start oral antihistamine like claritin or allegra. She has f/u appt next week with me.

## 2017-04-06 NOTE — Telephone Encounter (Signed)
Pt was seen in office today

## 2017-04-06 NOTE — Telephone Encounter (Signed)
Tried calling pt.  I was told to call back in hour pt was still in bed

## 2017-04-06 NOTE — Assessment & Plan Note (Addendum)
Resolving well. No signs of ongoing bacterial infection.

## 2017-04-06 NOTE — Telephone Encounter (Signed)
Spoke with pt notifying her rx was sent electronically to the pharmacy and that she no longer has to pick it up from our office.  She says ok.

## 2017-04-06 NOTE — Telephone Encounter (Signed)
appointmnet 1/3

## 2017-04-06 NOTE — Patient Instructions (Signed)
I don't think you have any more bacterial infection.  I think you have post-infectious inflammation leading to ongoing symptoms of runny nose and hoarse voice. Start taking either over the counter claritin or alegra (or generic equivalents). These are antihistamines and will help with your symptoms. Continue staying well hydrated and plenty of rest.

## 2017-04-06 NOTE — Telephone Encounter (Signed)
Appointment 1/3 pt aware °

## 2017-04-06 NOTE — Telephone Encounter (Signed)
Copied from [place] [id number]. Topic: Quick Communication - Office Called Patient >> Apr 06, 2017  9:04 AM Cecelia Byars, NT wrote: Reason for CRM: Patient  called back to find out why she was called , said she will be waiting for another call back  , please call  [phone]

## 2017-04-11 ENCOUNTER — Encounter: Payer: Self-pay | Admitting: Family Medicine

## 2017-04-11 ENCOUNTER — Ambulatory Visit (INDEPENDENT_AMBULATORY_CARE_PROVIDER_SITE_OTHER): Payer: Medicare Other | Admitting: Family Medicine

## 2017-04-11 VITALS — BP 120/60 | HR 63 | Temp 98.1°F | Wt 145.0 lb

## 2017-04-11 DIAGNOSIS — M79604 Pain in right leg: Secondary | ICD-10-CM

## 2017-04-11 DIAGNOSIS — R0981 Nasal congestion: Secondary | ICD-10-CM | POA: Diagnosis not present

## 2017-04-11 DIAGNOSIS — G8929 Other chronic pain: Secondary | ICD-10-CM

## 2017-04-11 DIAGNOSIS — I872 Venous insufficiency (chronic) (peripheral): Secondary | ICD-10-CM | POA: Diagnosis not present

## 2017-04-11 DIAGNOSIS — M79605 Pain in left leg: Secondary | ICD-10-CM

## 2017-04-11 DIAGNOSIS — M545 Low back pain: Secondary | ICD-10-CM

## 2017-04-11 NOTE — Patient Instructions (Addendum)
You have wear and tear arthritis of the knees right side more.  For congestion - continue allergy medicine and you should get better over time. Continue current medicines including pain medication.  Return in 3 months (first of April) for chronic pain visit.  Bring me box of compression stockings you have been using to re-order.

## 2017-04-11 NOTE — Assessment & Plan Note (Signed)
Chronic pain from CVI, known PAD, and anticipate knee osteoarthritis. Continue hydrocodone 1/2 tabl QID.

## 2017-04-11 NOTE — Assessment & Plan Note (Signed)
I've asked her to bring me box of compression stockings so we can re-order.

## 2017-04-11 NOTE — Assessment & Plan Note (Addendum)
NCCSRS reviewed and appropriate.  Tolerating medication, this has been long term management for her chronic back pain. Will continue.

## 2017-04-11 NOTE — Progress Notes (Signed)
BP 120/60 (BP Location: Left Arm, Patient Position: Sitting, Cuff Size: Normal)   Pulse 63   Temp 98.1 F (36.7 C) (Oral)   Wt 145 lb (65.8 kg)   SpO2 97%   BMI 31.38 kg/m    CC: 3 mo narcotic management visit "I'm not doing well at all today" Subjective:    [name], female    DOB: 1927/07/02, 84 y.o.   MRN: [id number]  HPI: [name] is a 84 y.o. female presenting on 04/11/2017 for 3 mo follow-up and Leg redness (Has red areas on right lower LE. Not sure when or how they occurred. Pt requests medical hose)   Completed wound clinic treatment of RLE venous ulcers.  Chronic pain managed with hydrocodone 1/2 tab QID. She tolerates this medication well. Denies constipation. Ongoing unsteadiness. She has rolling walker at home.   Indication for chronic opioid: chronic back and leg pain from known lumbar DDD with herniation - s/p L S1 transforaminal ESI (10/2012 Dr. Sharlet Salina).   Ongoing leg pain from chronic inflamed varicose veins. Ongoing knee pain.  Requests Rx for support hose. Chronic difficulty with getting stockings on.  Minimizing driving.   Relevant past medical, surgical, family and social history reviewed and updated as indicated. Interim medical history since our last visit reviewed. Allergies and medications reviewed and updated. Outpatient Medications Prior to Visit  Medication Sig Dispense Refill  . acetaminophen (TYLENOL) 500 MG tablet Take 500 mg by mouth every 6 (six) hours as needed for mild pain.    Marland Kitchen acidophilus (RISAQUAD) CAPS capsule Take 1 capsule by mouth daily.    Marland Kitchen ALPRAZolam (XANAX) 1 MG tablet Take 0.5 mg by mouth 4 (four) times daily as needed for anxiety.     Marland Kitchen aspirin EC 81 MG tablet Take 81 mg by mouth daily.    Marland Kitchen DEXILANT 30 MG capsule TAKE 1 CAPSULE (30 MG TOTAL) BY MOUTH DAILY. 30 capsule 11  . diclofenac sodium (VOLTAREN) 1 % GEL Apply 1 application topically 3 (three) times daily. 1 Tube 1  . ferrous sulfate 325 (65 FE) MG  tablet Take 1 tablet (325 mg total) by mouth daily with breakfast.    . furosemide (LASIX) 20 MG tablet TAKE 0.5 TABLETS (10 MG TOTAL) BY MOUTH DAILY. 45 tablet 3  . glucosamine-chondroitin 500-400 MG tablet Take 1 tablet by mouth daily.     Marland Kitchen glucose blood (ONE TOUCH ULTRA TEST) test strip USE AS INSTRUCTED TO CHECK BLOOD SUGAR ONCE A DAY Dx code: E11.9 50 each 5  . HYDROcodone-acetaminophen (NORCO/VICODIN) 5-325 MG tablet Take 0.5-1 tablets by mouth every 6 (six) hours as needed for moderate pain. 60 tablet 0  . loperamide (IMODIUM) 2 MG capsule Take 2 mg by mouth 4 (four) times daily as needed for diarrhea or loose stools.    . metoprolol tartrate (LOPRESSOR) 25 MG tablet TAKE ONE AND HALF TABLETS (37.5 MG TOTAL) BY MOUTH 2 (TWO) TIMES DAILY. 270 tablet 0  . Multiple Vitamin (MULTIVITAMIN) tablet Take 1 tablet by mouth daily. Patient reports takes a new vitamin to replace her centrum as recommended by her eye doctor.    . nitroGLYCERIN (NITROSTAT) 0.4 MG SL tablet Place 1 tablet (0.4 mg total) under the tongue every 5 (five) minutes as needed for chest pain. 25 tablet 3  . nystatin-triamcinolone ointment (MYCOLOG) APPLY 1 APPLICATION TOPICALLY 2 (TWO) TIMES DAILY. AS NEEDED FOR ITCHING 60 g 3  . pioglitazone (ACTOS) 30 MG tablet Take 1  tablet (30 mg total) by mouth daily. 90 tablet 3  . pravastatin (PRAVACHOL) 20 MG tablet TAKE 1 TABLET BY MOUTH EVERY DAY 30 tablet 3  . ramipril (ALTACE) 5 MG capsule TAKE ONE CAPSULE BY EVERYDAY FOR BLOOD PRESSURE 30 capsule 6  . traZODone (DESYREL) 150 MG tablet Take 150 mg by mouth at bedtime.     . triamcinolone cream (KENALOG) 0.1 % APPLY 1 APPLICATION TOPICALLY 2 (TWO) TIMES DAILY.  1  . vitamin B-12 (CYANOCOBALAMIN) 1000 MCG tablet Take 1,000 mcg by mouth daily.    . vitamin E 400 UNIT capsule Take 400 Units by mouth daily.     No facility-administered medications prior to visit.      Per HPI unless specifically indicated in ROS section below Review of  Systems     Objective:    BP 120/60 (BP Location: Left Arm, Patient Position: Sitting, Cuff Size: Normal)   Pulse 63   Temp 98.1 F (36.7 C) (Oral)   Wt 145 lb (65.8 kg)   SpO2 97%   BMI 31.38 kg/m   Wt Readings from Last 3 Encounters:  04/11/17 145 lb (65.8 kg)  04/06/17 142 lb (64.4 kg)  03/29/17 141 lb (64 kg)    Physical Exam  Constitutional: She appears well-developed and well-nourished. No distress.  Musculoskeletal: She exhibits edema (tr).  CVI changes with venous inflammation bilaterally, no new ulcers or erosions.  Tender to palpation medial R knee with limited flexion, FROM extension, without significant erythema or swelling  Skin: Skin is warm and dry. No rash noted. No erythema.  Nursing note and vitals reviewed.  Results for orders placed or performed in visit on 01/25/17  POCT glucose (manual entry)  Result Value Ref Range   POC Glucose 81 70 - 99 mg/dl  POCT glycosylated hemoglobin (Hb A1C)  Result Value Ref Range   Hemoglobin A1C 6.3       Assessment & Plan:   Problem List Items Addressed This Visit    Chronic lower back pain   Chronic venous insufficiency    I've asked her to bring me box of compression stockings so we can re-order.      Encounter for chronic pain management - Primary    NCCSRS reviewed and appropriate.  Tolerating medication, this has been long term management for her chronic back pain. Will continue.       Leg pain, bilateral    Chronic pain from CVI, known PAD, and anticipate knee osteoarthritis. Continue hydrocodone 1/2 tabl QID.      Sinus congestion    Continued slow improvement.           Follow up plan: Return in about 3 months (around 07/03/2017), or if symptoms worsen or fail to improve, for follow up visit.  [name] Bush, MD

## 2017-04-11 NOTE — Assessment & Plan Note (Signed)
Continued slow improvement.

## 2017-04-25 ENCOUNTER — Other Ambulatory Visit: Payer: Self-pay | Admitting: Family Medicine

## 2017-05-15 ENCOUNTER — Ambulatory Visit: Payer: Medicare Other | Admitting: Podiatry

## 2017-05-16 ENCOUNTER — Ambulatory Visit (INDEPENDENT_AMBULATORY_CARE_PROVIDER_SITE_OTHER): Payer: Medicare Other | Admitting: Podiatry

## 2017-05-16 ENCOUNTER — Encounter: Payer: Self-pay | Admitting: Podiatry

## 2017-05-16 DIAGNOSIS — B351 Tinea unguium: Secondary | ICD-10-CM | POA: Diagnosis not present

## 2017-05-16 DIAGNOSIS — M79675 Pain in left toe(s): Secondary | ICD-10-CM | POA: Diagnosis not present

## 2017-05-16 DIAGNOSIS — M79674 Pain in right toe(s): Secondary | ICD-10-CM

## 2017-05-16 DIAGNOSIS — E1149 Type 2 diabetes mellitus with other diabetic neurological complication: Secondary | ICD-10-CM | POA: Diagnosis not present

## 2017-05-16 DIAGNOSIS — Q828 Other specified congenital malformations of skin: Secondary | ICD-10-CM | POA: Diagnosis not present

## 2017-05-17 ENCOUNTER — Telehealth: Payer: Self-pay | Admitting: Family Medicine

## 2017-05-17 MED ORDER — HYDROCODONE-ACETAMINOPHEN 5-325 MG PO TABS: 0.5000 | ORAL_TABLET | Freq: Four times a day (QID) | ORAL | 0 refills | Status: DC | PRN

## 2017-05-17 NOTE — Telephone Encounter (Signed)
Copied from [place]. Topic: Quick Communication - Rx Refill/Question >> May 17, 2017 11:47 AM Percell Belt A wrote: Medication: HYDROcodone-acetaminophen (NORCO/VICODIN) 5-325 MG tablet [[id number]]    Has the patient contacted their pharmacy? No    (Agent: If no, request that the patient contact the pharmacy for the refill.)   Preferred Pharmacy (with phone number or street name): [facility]: Please be advised that RX refills may take up to 3 business days. We ask that you follow-up with your pharmacy.

## 2017-05-17 NOTE — Telephone Encounter (Signed)
Hydrocodone  Refill  Request  LOV 04-11-2017   Last  Filled   04/05/2017    Pharmacy  Requested  [place]

## 2017-05-17 NOTE — Telephone Encounter (Signed)
Eprescribed.

## 2017-05-21 NOTE — Progress Notes (Signed)
Subjective: 84 y.o. returns the office today for painful, elongated, thickened toenails which she cannot trim herself. Denies any redness or drainage around the nails. She also continues to get a painful callus to the ball of her right although she does state that after I trim it the pain improves but it does continue to come back. She puts Vaseline on the area which helps. She also has varicose veins and is asking for compression stockings again today. Denies any open sores.  Denies any drainage or pus.Denies any acute changes since last appointment and no new complaints today. Denies any systemic complaints such as fevers, chills, nausea, vomiting.   Objective: AAO 3, NAD DP/PT pulses palpable 1/4, CRT less than 3 seconds Protective sensation decreased with Simms Weinstein monofilament, Achilles tendon reflex intact.  Nails hypertrophic, dystrophic, elongated, brittle, discolored 10. There is tenderness overlying the nails 1-5 bilaterally. There is no surrounding erythema or drainage along the nail sites. Hyperkeratic lesion to the right foot sub met 2. Upon debridement, no underlying ulceration, drainage, or signs of infection.  Prominent metatarsal head plantar. Hammertoes present.  Varicose veins are present.  No open lesions or pre-ulcerative lesions are identified. No other areas of tenderness bilateral lower extremities. No overlying edema, erythema, increased warmth. No pain with calf compression, swelling, warmth, erythema.  Assessment: Patient presents with symptomatic onychomycosis, hyperkeratotic lesion  Plan: -Treatment options including alternatives, risks, complications were discussed -Prescription provided again today for compression stockings -Hyperkeratotic lesion sharply debrided x 1 without complications or bleeding. -Nails sharply debrided 10 without complication/bleeding. -Discussed daily foot inspection. If there are any changes, to call the office immediately.   -Follow-up in 3 months or sooner if any problems are to arise. In the meantime, encouraged to call the office with any questions, concerns, changes symptoms.  Celesta Gentile, DPM

## 2017-05-24 ENCOUNTER — Other Ambulatory Visit: Payer: Self-pay | Admitting: Family Medicine

## 2017-05-30 ENCOUNTER — Ambulatory Visit (INDEPENDENT_AMBULATORY_CARE_PROVIDER_SITE_OTHER): Payer: Medicare Other | Admitting: Gynecology

## 2017-05-30 ENCOUNTER — Encounter: Payer: Self-pay | Admitting: Gynecology

## 2017-05-30 VITALS — BP 130/84 | Ht <= 58 in | Wt 144.0 lb

## 2017-05-30 DIAGNOSIS — Z01411 Encounter for gynecological examination (general) (routine) with abnormal findings: Secondary | ICD-10-CM

## 2017-05-30 DIAGNOSIS — N952 Postmenopausal atrophic vaginitis: Secondary | ICD-10-CM

## 2017-05-30 DIAGNOSIS — N8111 Cystocele, midline: Secondary | ICD-10-CM

## 2017-05-30 DIAGNOSIS — Z9189 Other specified personal risk factors, not elsewhere classified: Secondary | ICD-10-CM

## 2017-05-30 NOTE — Progress Notes (Signed)
    [employer] 09-Jul-1927 [id number]        84 y.o.  G4P2001 for breast and pelvic exam.  Past medical history,surgical history, problem list, medications, allergies, family history and social history were all reviewed and documented as reviewed in the EPIC chart.  ROS:  Performed with pertinent positives and negatives included in the history, assessment and plan.   Additional significant findings : None   Exam: Caryn Bee assistant Vitals:   05/30/17 1513  BP: 130/84  Weight: 144 lb (65.3 kg)  Height: 4\' 9"  (1.448 m)   Body mass index is 31.16 kg/m.  General appearance:  Normal affect, orientation and appearance. Skin: Grossly normal HEENT: Without gross lesions.  No cervical or supraclavicular adenopathy. Thyroid normal.  Lungs:  Clear without wheezing, rales or rhonchi Cardiac: RR, without RMG Abdominal:  Soft, nontender, without masses, guarding, rebound, organomegaly or hernia Breasts:  Examined lying and sitting without masses, retractions, discharge or axillary adenopathy. Pelvic:  Ext, BUS, Vagina: With atrophic changes.  Second-degree cystocele noted.  Cuff well supported.  Adnexa: Without masses or tenderness    Anus and perineum: Normal   Rectovaginal: Normal sphincter tone without palpated masses or tenderness.    Assessment/Plan:  84 y.o. G21P2001 female for breast and pelvic exam.   1. Cystocele.  Stable over serial exams.  Status post vaginal vault prolapse repair with a and P by Dr. Rhodia Albright 2009.  Patient overall doing well asymptomatic.  We will continue to monitor and report any issues. 2. DEXA 2013 normal.  Discussed about repeating DEXA now.  Son just died during cardiac cath and she is dealing with a lot of issues at this point and prefers to wait.  We will plan on scheduling next year. 3. Mammography 06/2016.  Patient knows she is coming due and has this scheduled.  Breast exam normal today. 4. Colonoscopy 2011.  Repeat at their recommended  interval. 5. Pap smear 2012.  No Pap smear done today.  We both agree to stop screening per current screening guidelines based on age and hysterectomy history. 6. Health maintenance.  The patient I discussed whether she needs to continue with annual exams at her age and lack of symptoms.  I am going to leave this up to her she will follow-up if she has any issues or if she wants to continue with annual exams and she will follow-up in 1 year.   Anastasio Auerbach MD, 3:50 PM 05/30/2017

## 2017-05-30 NOTE — Patient Instructions (Signed)
Follow-up if any gynecologic issues. 

## 2017-06-12 DIAGNOSIS — F411 Generalized anxiety disorder: Secondary | ICD-10-CM | POA: Diagnosis not present

## 2017-06-12 DIAGNOSIS — F4321 Adjustment disorder with depressed mood: Secondary | ICD-10-CM | POA: Diagnosis not present

## 2017-06-22 ENCOUNTER — Other Ambulatory Visit: Payer: Self-pay | Admitting: Family Medicine

## 2017-06-22 MED ORDER — HYDROCODONE-ACETAMINOPHEN 5-325 MG PO TABS: 0.5000 | ORAL_TABLET | Freq: Four times a day (QID) | ORAL | 0 refills | Status: DC | PRN

## 2017-06-22 NOTE — Telephone Encounter (Signed)
LOV  04/11/17 Dr. Danise Mina [facility]

## 2017-06-22 NOTE — Telephone Encounter (Signed)
Copied from [place] [phone]. Topic: General - Other >> Jun 22, 2017  1:53 PM Darl Householder, RMA wrote: Reason for CRM: Medication refill request for HYDROcodone-acetaminophen (NORCO/VICODIN) 5-325 MG tablet  to be sent to [facility]

## 2017-06-22 NOTE — Telephone Encounter (Signed)
  Pt requesting refill Hydrocodone apap Last refilled and qty; # 60 on 05/17/17 Last seen:04/11/17 [facility] UDS: 02/09/16

## 2017-06-22 NOTE — Telephone Encounter (Signed)
Eprescribed.

## 2017-06-26 DIAGNOSIS — F429 Obsessive-compulsive disorder, unspecified: Secondary | ICD-10-CM | POA: Diagnosis not present

## 2017-06-26 DIAGNOSIS — F411 Generalized anxiety disorder: Secondary | ICD-10-CM | POA: Diagnosis not present

## 2017-07-07 ENCOUNTER — Other Ambulatory Visit: Payer: Self-pay | Admitting: Family Medicine

## 2017-07-11 ENCOUNTER — Encounter: Payer: Self-pay | Admitting: Family Medicine

## 2017-07-11 ENCOUNTER — Ambulatory Visit (INDEPENDENT_AMBULATORY_CARE_PROVIDER_SITE_OTHER): Payer: Medicare Other | Admitting: Family Medicine

## 2017-07-11 VITALS — BP 136/66 | HR 70 | Temp 97.8°F | Ht <= 58 in | Wt 144.0 lb

## 2017-07-11 DIAGNOSIS — F4321 Adjustment disorder with depressed mood: Secondary | ICD-10-CM

## 2017-07-11 DIAGNOSIS — F411 Generalized anxiety disorder: Secondary | ICD-10-CM | POA: Diagnosis not present

## 2017-07-11 DIAGNOSIS — G8929 Other chronic pain: Secondary | ICD-10-CM | POA: Diagnosis not present

## 2017-07-11 DIAGNOSIS — M545 Low back pain: Secondary | ICD-10-CM

## 2017-07-11 DIAGNOSIS — R232 Flushing: Secondary | ICD-10-CM | POA: Diagnosis not present

## 2017-07-11 MED ORDER — FERROUS SULFATE 325 (65 FE) MG PO TABS: 325.0000 mg | ORAL_TABLET | ORAL | Status: DC

## 2017-07-11 NOTE — Patient Instructions (Addendum)
Ask Lattie Haw if Paxil would be a good option for you - to help with anxiety and hot flashes you're experiencing.  Continue seeing counselor.  Hang in there!  Let your family know what you need from them, but be kind about it.  Continue current medicines. Good to see you today.  Return as needed or in 3 months for follow up visit.

## 2017-07-11 NOTE — Assessment & Plan Note (Addendum)
[place] CSRS reviewed and appropriate.  Reviewed pain regimen.

## 2017-07-11 NOTE — Assessment & Plan Note (Addendum)
Worsening, pt attributes to worsening stress/anxiety. Not candidate for hormonal therapy given h/o DVTs. Not interested in SNRI/SSRI. Will continue to monitor.

## 2017-07-11 NOTE — Assessment & Plan Note (Signed)
Son passed away 01/2017. Support provided. Has established with counselor.

## 2017-07-11 NOTE — Progress Notes (Signed)
BP 136/66 (BP Location: Left Arm, Patient Position: Sitting, Cuff Size: Normal)   Pulse 70   Temp 97.8 F (36.6 C) (Oral)   Ht 4\' 9"  (1.448 m)   Wt 144 lb (65.3 kg)   SpO2 94%   BMI 31.16 kg/m    CC: chronic pain visit Subjective:    [name], female    DOB: 27-Jun-1927, 84 y.o.   MRN: [id number]  HPI: [name] is a 84 y.o. female presenting on 07/11/2017 for Pain and Hot Flashes (C/o hot flashes for awhile now.)   [name] passed away last year 2017-02-23 - this has been very difficult for her, worse for the last 2 months. She feels she's having anxiety attacks from this, worsening IBS.  Sees psychiatrist Noemi Chapel NP at [facility]. Pt not interested in med changes.  She has started seeing counselor Celesta Gentile which she thinks is helpful.   Chronic pain managed with hydrocodone 1/2 tab QID, longterm medication. She tolerates this medication well. Denies constipation. Limits driving. Indication for chronic opioid: chronic back and leg pain from known lumbar DDD with herniation - s/p L S1 transforaminal ESI (10/2012 Dr. Sharlet Salina).   Worsening lower back pain and R knee pain. She fell at work 5 weeks ago - with residual R shoulder pain - it continues improving.   Relevant past medical, surgical, family and social history reviewed and updated as indicated. Interim medical history since our last visit reviewed. Allergies and medications reviewed and updated. Outpatient Medications Prior to Visit  Medication Sig Dispense Refill  . acetaminophen (TYLENOL) 500 MG tablet Take 500 mg by mouth every 6 (six) hours as needed for mild pain.    Marland Kitchen acidophilus (RISAQUAD) CAPS capsule Take 1 capsule by mouth daily.    Marland Kitchen ALPRAZolam (XANAX) 1 MG tablet Take 0.5 mg by mouth 4 (four) times daily as needed for anxiety.     Marland Kitchen aspirin EC 81 MG tablet Take 81 mg by mouth daily.    Marland Kitchen DEXILANT 30 MG capsule TAKE 1 CAPSULE (30 MG TOTAL) BY MOUTH DAILY. 30 capsule 11  .  diclofenac sodium (VOLTAREN) 1 % GEL Apply 1 application topically 3 (three) times daily. 1 Tube 1  . furosemide (LASIX) 20 MG tablet TAKE 0.5 TABLETS (10 MG TOTAL) BY MOUTH DAILY. 45 tablet 3  . glucosamine-chondroitin 500-400 MG tablet Take 1 tablet by mouth daily.     Marland Kitchen glucose blood (ONE TOUCH ULTRA TEST) test strip USE AS INSTRUCTED TO CHECK BLOOD SUGAR ONCE A DAY Dx code: E11.9 50 each 5  . HYDROcodone-acetaminophen (NORCO/VICODIN) 5-325 MG tablet Take 0.5-1 tablets by mouth every 6 (six) hours as needed for moderate pain. 60 tablet 0  . loperamide (IMODIUM) 2 MG capsule Take 2 mg by mouth 4 (four) times daily as needed for diarrhea or loose stools.    . metoprolol tartrate (LOPRESSOR) 25 MG tablet TAKE ONE AND HALF TABLETS (37.5 MG TOTAL) BY MOUTH 2 (TWO) TIMES DAILY. 270 tablet 0  . Multiple Vitamin (MULTIVITAMIN) tablet Take 1 tablet by mouth daily. Patient reports takes a new vitamin to replace her centrum as recommended by her eye doctor.    . nitroGLYCERIN (NITROSTAT) 0.4 MG SL tablet Place 1 tablet (0.4 mg total) under the tongue every 5 (five) minutes as needed for chest pain. 25 tablet 3  . nystatin-triamcinolone ointment (MYCOLOG) APPLY 1 APPLICATION TOPICALLY 2 (TWO) TIMES DAILY. AS NEEDED FOR ITCHING 60 g 3  .  pioglitazone (ACTOS) 30 MG tablet Take 1 tablet (30 mg total) by mouth daily. 90 tablet 3  . pravastatin (PRAVACHOL) 20 MG tablet TAKE 1 TABLET BY MOUTH EVERY DAY 30 tablet 3  . ramipril (ALTACE) 5 MG capsule TAKE ONE CAPSULE BY MOUTH EVERYDAY FOR BLOOD PRESSURE 30 capsule 6  . traZODone (DESYREL) 150 MG tablet Take 150 mg by mouth at bedtime.     . triamcinolone cream (KENALOG) 0.1 % APPLY 1 APPLICATION TOPICALLY 2 (TWO) TIMES DAILY.  1  . vitamin B-12 (CYANOCOBALAMIN) 1000 MCG tablet Take 1,000 mcg by mouth daily.    . vitamin E 400 UNIT capsule Take 400 Units by mouth daily.    . ferrous sulfate 325 (65 FE) MG tablet Take 1 tablet (325 mg total) by mouth daily with  breakfast.     No facility-administered medications prior to visit.      Per HPI unless specifically indicated in ROS section below Review of Systems     Objective:    BP 136/66 (BP Location: Left Arm, Patient Position: Sitting, Cuff Size: Normal)   Pulse 70   Temp 97.8 F (36.6 C) (Oral)   Ht 4\' 9"  (1.448 m)   Wt 144 lb (65.3 kg)   SpO2 94%   BMI 31.16 kg/m   Wt Readings from Last 3 Encounters:  07/11/17 144 lb (65.3 kg)  05/30/17 144 lb (65.3 kg)  04/11/17 145 lb (65.8 kg)    Physical Exam  Constitutional: She appears well-developed and well-nourished. No distress.  HENT:  Mouth/Throat: Oropharynx is clear and moist. No oropharyngeal exudate.  Cardiovascular: Normal rate, regular rhythm, normal heart sounds and intact distal pulses.  No murmur heard. Pulmonary/Chest: Effort normal and breath sounds normal. No respiratory distress. She has no wheezes. She has no rales.  Abdominal: Soft. Bowel sounds are normal. She exhibits no distension and no mass. There is no tenderness. There is no rebound and no guarding.  Musculoskeletal: She exhibits no edema.  Psychiatric: Her mood appears anxious.  Tearful with discussion of stressors  Nursing note and vitals reviewed.  Results for orders placed or performed in visit on 2017/01/27  POCT glucose (manual entry)  Result Value Ref Range   POC Glucose 81 70 - 99 mg/dl  POCT glycosylated hemoglobin (Hb A1C)  Result Value Ref Range   Hemoglobin A1C 6.3       Assessment & Plan:  Over 25 minutes were spent face-to-face with the patient during this encounter and >50% of that time was spent on counseling and coordination of care  Problem List Items Addressed This Visit    Chronic lower back pain    Reviewed chronic pain regimen.       Encounter for chronic pain management - Primary    [place] CSRS reviewed and appropriate.  Reviewed pain regimen.       GAD (generalized anxiety disorder)    Discussed current stressors, grieving  [name]'s passing, support provided. Encouraged she continue seeing counselor. She is not interested in med changes at this time.       Grieving    [name] passed away 01-27-2017. Support provided. Has established with counselor.       Hot flashes    Worsening, pt attributes to worsening stress/anxiety. Not candidate for hormonal therapy given h/o DVTs. Not interested in SNRI/SSRI. Will continue to monitor.          Meds ordered this encounter  Medications  . ferrous sulfate 325 (65 FE) MG tablet  Sig: Take 1 tablet (325 mg total) by mouth every Monday, Wednesday, and Friday.   No orders of the defined types were placed in this encounter.   Follow up plan: Return in about 3 months (around 10/10/2017) for follow up visit.  Ria Bush, MD

## 2017-07-11 NOTE — Assessment & Plan Note (Signed)
Discussed current stressors, grieving son's passing, support provided. Encouraged she continue seeing counselor. She is not interested in med changes at this time.

## 2017-07-11 NOTE — Assessment & Plan Note (Signed)
Reviewed chronic pain regimen.

## 2017-07-23 ENCOUNTER — Other Ambulatory Visit: Payer: Self-pay | Admitting: Family Medicine

## 2017-07-24 DIAGNOSIS — F429 Obsessive-compulsive disorder, unspecified: Secondary | ICD-10-CM | POA: Diagnosis not present

## 2017-07-24 DIAGNOSIS — F411 Generalized anxiety disorder: Secondary | ICD-10-CM | POA: Diagnosis not present

## 2017-07-26 ENCOUNTER — Ambulatory Visit (INDEPENDENT_AMBULATORY_CARE_PROVIDER_SITE_OTHER): Payer: Medicare Other | Admitting: Endocrinology

## 2017-07-26 ENCOUNTER — Other Ambulatory Visit: Payer: Self-pay | Admitting: Gynecology

## 2017-07-26 ENCOUNTER — Encounter: Payer: Self-pay | Admitting: Endocrinology

## 2017-07-26 VITALS — BP 126/72 | HR 73 | Ht <= 58 in | Wt 145.8 lb

## 2017-07-26 DIAGNOSIS — M79604 Pain in right leg: Secondary | ICD-10-CM

## 2017-07-26 DIAGNOSIS — M79605 Pain in left leg: Secondary | ICD-10-CM

## 2017-07-26 DIAGNOSIS — E119 Type 2 diabetes mellitus without complications: Secondary | ICD-10-CM

## 2017-07-26 LAB — COMPREHENSIVE METABOLIC PANEL
ALT: 15 U/L (ref 0–35)
AST: 21 U/L (ref 0–37)
Albumin: 3.7 g/dL (ref 3.5–5.2)
Alkaline Phosphatase: 102 U/L (ref 39–117)
BUN: 26 mg/dL — AB (ref 6–23)
CHLORIDE: 102 meq/L (ref 96–112)
CO2: 32 meq/L (ref 19–32)
CREATININE: 0.87 mg/dL (ref 0.40–1.20)
Calcium: 9.3 mg/dL (ref 8.4–10.5)
GFR: 65.29 mL/min (ref >60.00)
GLUCOSE: 77 mg/dL (ref 70–99)
Potassium: 4.3 mEq/L (ref 3.5–5.1)
SODIUM: 137 meq/L (ref 135–145)
Total Bilirubin: 0.5 mg/dL (ref 0.2–1.2)
Total Protein: 6.6 g/dL (ref 6.0–8.3)

## 2017-07-26 LAB — URINALYSIS, ROUTINE W REFLEX MICROSCOPIC
Bilirubin Urine: NEGATIVE
Hgb urine dipstick: NEGATIVE
Ketones, ur: NEGATIVE
NITRITE: NEGATIVE
PH: 5.5 (ref 5.0–8.0)
SPECIFIC GRAVITY, URINE: 1.02 (ref 1.000–1.030)
Total Protein, Urine: NEGATIVE
Urine Glucose: NEGATIVE
Urobilinogen, UA: 0.2 (ref 0.0–1.0)

## 2017-07-26 LAB — CBC WITH DIFFERENTIAL/PLATELET
Basophils Absolute: 0.1 10*3/uL (ref 0.0–0.1)
Basophils Relative: 0.9 % (ref 0.0–3.0)
Eosinophils Absolute: 0.3 10*3/uL (ref 0.0–0.7)
Eosinophils Relative: 3.1 % (ref 0.0–5.0)
HCT: 38.7 % (ref 36.0–46.0)
Hemoglobin: 12.9 g/dL (ref 12.0–15.0)
LYMPHS ABS: 1.8 10*3/uL (ref 0.7–4.0)
Lymphocytes Relative: 21.8 % (ref 12.0–46.0)
MCHC: 33.4 g/dL (ref 30.0–36.0)
MCV: 93 fl (ref 78.0–100.0)
MONOS PCT: 8.5 % (ref 3.0–12.0)
Monocytes Absolute: 0.7 10*3/uL (ref 0.1–1.0)
NEUTROS PCT: 65.7 % (ref 43.0–77.0)
Neutro Abs: 5.5 10*3/uL (ref 1.4–7.7)
Platelets: 219 10*3/uL (ref 150.0–400.0)
RBC: 4.17 Mil/uL (ref 3.87–5.11)
RDW: 13.7 % (ref 11.5–15.5)
WBC: 8.4 10*3/uL (ref 4.0–10.5)

## 2017-07-26 LAB — MICROALBUMIN / CREATININE URINE RATIO
CREATININE, U: 46.3 mg/dL
MICROALB/CREAT RATIO: 1.5 mg/g (ref 0.0–30.0)

## 2017-07-26 LAB — POCT GLYCOSYLATED HEMOGLOBIN (HGB A1C): HEMOGLOBIN A1C: 6.5

## 2017-07-26 NOTE — Progress Notes (Signed)
[name], female   DOB: 03/28/1928, 84 y.o.   MRN: [id number]   Reason for Appointment: Diabetes follow-up   History of Present Illness   Diagnosis: Type 2 DIABETES MELITUS, date of diagnosis: 1991   She has had mild diabetes for several years and has been on Actos only since about 2001 Her A1c has generally been around or under 7% She was given a trial of metformin because of her difficulty with weight loss and also edema but she felt that it made her blood sugars go up and she will not try it again Also she thinks her blood sugars were higher with a trial of Januvia  RECENT history:  She is currently taking Actos 30 mg daily since 01/2014  Again she not bring her monitor, she tends to forget As before blood sugars may be higher after breakfast and she is eating cereal, sometimes toast and eggs along with fruit and coffee in her sugar, has not reduced her carbohydrate as discussed before She thinks blood sugars are usually back to near 100 other times of the day  A1c has been consistently below 7% and now 6.5     Oral hypoglycemic drugs: Actos 30 mg daily        Side effects from medications: None Monitors blood glucose: ?  Frequency .    Glucometer: One Touch.           Blood Glucose readings from recall: As above, highest reading about 222  Hypoglycemia frequency: Never.           Meals: 2  meals per day usually Physical activity:  minimal, is able to get drawn mostly with walker            Dietician visit: Most recent: Several years ago             Wt Readings from Last 3 Encounters:  07/26/17 145 lb 12.8 oz (66.1 kg)  07/11/17 144 lb (65.3 kg)  05/30/17 144 lb (65.3 kg)   Lab Results  Component Value Date   HGBA1C 6.5 07/26/2017   HGBA1C 6.3 01/25/2017   HGBA1C 6.6 10/26/2016   Lab Results  Component Value Date   MICROALBUR <0.7 04/27/2016   LDLCALC 56 12/30/2015   CREATININE 1.05 10/26/2016     Allergies as of 07/26/2017     Reactions   Metformin And Related Diarrhea      Medication List        Accurate as of 07/26/17  4:51 PM. Always use your most recent med list.          acetaminophen 500 MG tablet Commonly known as:  TYLENOL Take 500 mg by mouth every 6 (six) hours as needed for mild pain.   acidophilus Caps capsule Take 1 capsule by mouth daily.   ALPRAZolam 1 MG tablet Commonly known as:  XANAX Take 0.5 mg by mouth 4 (four) times daily as needed for anxiety.   aspirin EC 81 MG tablet Take 81 mg by mouth daily.   DEXILANT 30 MG capsule Generic drug:  Dexlansoprazole TAKE 1 CAPSULE (30 MG TOTAL) BY MOUTH DAILY.   diclofenac sodium 1 % Gel Commonly known as:  VOLTAREN Apply 1 application topically 3 (three) times daily.   ferrous sulfate 325 (65 FE) MG tablet Take 1 tablet (325 mg total) by mouth every Monday, Wednesday, and Friday.   furosemide 20 MG tablet Commonly known as:  LASIX TAKE 0.5 TABLETS (10 MG TOTAL)  BY MOUTH DAILY.   glucosamine-chondroitin 500-400 MG tablet Take 1 tablet by mouth daily.   glucose blood test strip Commonly known as:  ONE TOUCH ULTRA TEST USE AS INSTRUCTED TO CHECK BLOOD SUGAR ONCE A DAY Dx code: E11.9   HYDROcodone-acetaminophen 5-325 MG tablet Commonly known as:  NORCO/VICODIN Take 0.5-1 tablets by mouth every 6 (six) hours as needed for moderate pain.   loperamide 2 MG capsule Commonly known as:  IMODIUM Take 2 mg by mouth 4 (four) times daily as needed for diarrhea or loose stools.   metoprolol tartrate 25 MG tablet Commonly known as:  LOPRESSOR TAKE ONE AND HALF TABLETS (37.5 MG TOTAL) BY MOUTH 2 (TWO) TIMES DAILY.   multivitamin tablet Take 1 tablet by mouth daily. Patient reports takes a new vitamin to replace her centrum as recommended by her eye doctor.   nitroGLYCERIN 0.4 MG SL tablet Commonly known as:  NITROSTAT Place 1 tablet (0.4 mg total) under the tongue every 5 (five) minutes as needed for chest pain.     nystatin-triamcinolone ointment Commonly known as:  MYCOLOG APPLY 1 APPLICATION TOPICALLY 2 (TWO) TIMES DAILY. AS NEEDED FOR ITCHING   pioglitazone 30 MG tablet Commonly known as:  ACTOS Take 1 tablet (30 mg total) by mouth daily.   pravastatin 20 MG tablet Commonly known as:  PRAVACHOL TAKE 1 TABLET BY MOUTH EVERY DAY   ramipril 5 MG capsule Commonly known as:  ALTACE TAKE ONE CAPSULE BY MOUTH EVERYDAY FOR BLOOD PRESSURE   traZODone 150 MG tablet Commonly known as:  DESYREL Take 150 mg by mouth at bedtime.   triamcinolone cream 0.1 % Commonly known as:  KENALOG APPLY 1 APPLICATION TOPICALLY 2 (TWO) TIMES DAILY.   vitamin B-12 1000 MCG tablet Commonly known as:  CYANOCOBALAMIN Take 1,000 mcg by mouth daily.   vitamin E 400 UNIT capsule Take 400 Units by mouth daily.       Allergies:  Allergies  Allergen Reactions  . Metformin And Related Diarrhea    Past Medical History:  Diagnosis Date  . Acute deep vein thrombosis (DVT) of distal vein of right lower extremity ([place]) 12/2013   Acute thrombus of R proximal gastrocnemius vein. Symptomatic provoked distal DVT  . Anxiety   . Cellulitis of leg, left 01/16/2014  . Chronic back pain 03/2012   lumbar DDD with herniation - s/p L S1 transforaminal ESI (10/2012 Dr. Sharlet Salina)  . Chronic venous insufficiency   . Coronary artery disease    CABG 1998  . Cystocele   . Depression   . Diabetes mellitus    type 2, followed by endo.  . Diastolic dysfunction   . Diverticulosis   . Hemorrhoids   . History of heart attack   . Hx of echocardiogram    a. Echo 10/13: mild LVH, EF 55-60%, Gr 1 diast dysfn, PASP 32  . Hyperlipidemia   . Hypertension   . Irritable bowel syndrome   . Microcytic anemia   . PAD (peripheral artery disease) ([place]) 2013   ABI: R 0.91, L 0.83  . PUD (peptic ulcer disease)   . Varicose veins   . Venous ulcer of leg ([place]) 12/12/2013   Required prolonged treatment by wound center  . Viral gastroenteritis  due to [place] virus 05/13/2015   hospitalization    Past Surgical History:  Procedure Laterality Date  . ABDOMINAL HYSTERECTOMY  1975   TAH,BSO  . ABI  2013   R 0.91, L 0.83  . APPENDECTOMY  per pt report  . CARDIOVASCULAR STRESS TEST  2015   low risk myoview ([place])  . CATARACT SURGERY    . CHOLECYSTECTOMY    . COLONOSCOPY  02/2010   extensive diverticulosis throughout, small internal hemorrhoids, rec rpt 5 yrs (Dr. Allyn Kenner)  . CORONARY ARTERY BYPASS GRAFT  1998  . dexa scan  09/2011   femur -0.8, forearm 0.6 => normal  . ENDOVENOUS ABLATION SAPHENOUS VEIN W/ LASER Left 03/2014   Kellie Simmering  . ESI  2014   L S1 transforaminal ESI x2 ([place]) without improvement  . HAND SURGERY    . lexiscan myoview  03/2011   Small basal inferolateral and anterolateral reversible perfusion defect suggests ischemia.  EF was normal.   old infarct, no new ischemia  . toe surgery    . TYMPANOPLASTY  1960s   R side  . Vault prolapse A & P repair  2009   Dr Rhodia Albright [facility] hospital    Family History  Problem Relation Age of Onset  . Hypertension Mother   . Stroke Mother   . Diabetes Father   . Diabetes Sister   . Diabetes Brother   . Heart disease Son   . Colon cancer Neg Hx     Social History:  reports that she quit smoking about 38 years ago. She has never used smokeless tobacco. She reports that she does not drink alcohol or use drugs.  Review of Systems:  Has complaints of fatigue chronically Also she tends to have some sweating and feeling of hot flash but her daughter-in-law thinks that this is mostly when she is rushing and stressed  Lab Results  Component Value Date   TSH 2.41 10/26/2016   TSH 2.71 12/30/2015   TSH 1.82 10/31/2014   [name]4 0.91 10/26/2016     HYPERTENSION:  well-controlled on Ramipril 5 mg  Also takes Lasix 20 mg for edema daily prescribed by PCP    Lab Results  Component Value Date   CREATININE 1.05 10/26/2016   BUN 25 (H) 10/26/2016   NA 137  10/26/2016   K 4.8 10/26/2016   CL 103 10/26/2016   CO2 31 10/26/2016     HYPERLIPIDEMIA: The lipid abnormality consists of elevated LDL treated with pravastatin low-dose, followed by Cardiologist/PCP.  Lab Results  Component Value Date   CHOL 118 12/30/2015   HDL 45.40 12/30/2015   LDLCALC 56 12/30/2015   TRIG 80.0 12/30/2015   CHOLHDL 3 12/30/2015    Last foot exam in 10/2016      Examination:   BP 126/72 (BP Location: Left Arm, Patient Position: Sitting, Cuff Size: Normal)   Pulse 73   Ht 4\' 9"  (1.448 m)   Wt 145 lb 12.8 oz (66.1 kg)   SpO2 98%   BMI 31.55 kg/m   Body mass index is 31.55 kg/m.     ASSESSMENT/ PLAN:   The patient's diabetes control is very stable With taking Actos alone as before  Blood sugars are excellent for her age and duration of diabetes She is taking Actos without edema or heart failure  She does not bring her monitor but her daughter-in-law says that she will try to make sure this is reviewed on the next visit She does have relatively high readings after eating really in the morning but she does not want to change her habits  To continue Actos 30 mg once daily   May potentially benefit from low-dose Jardiance if blood sugars start going up  Will  see  her back in 6 months  Generalized aches and pains: She will follow-up with PCP  We need to do labs for her today since she has not had anything drawn from her PCP or cardiologist for some time   Patient Instructions  Need to get eye exam  Total visit time = 15 minutes  Jamar Weatherall 07/26/2017, 4:51 PM      Note: This office note was prepared with Dragon voice recognition system technology. Any transcriptional errors that result from this process are unintentional.

## 2017-07-26 NOTE — Patient Instructions (Signed)
Need to get eye exam

## 2017-08-02 ENCOUNTER — Other Ambulatory Visit: Payer: Self-pay | Admitting: Family Medicine

## 2017-08-02 NOTE — Telephone Encounter (Signed)
Refill of Hydrocodone  LOV 07/11/17  Dr. Danise Mina  [facility] 06/22/17  #60  0 refills  [id number] [place], [place] - [address]

## 2017-08-02 NOTE — Telephone Encounter (Signed)
Copied from [place] [phone]. Topic: Quick Communication - Rx Refill/Question >> Aug 02, 2017  2:11 PM Synthia Innocent wrote: Medication: HYDROcodone-acetaminophen (NORCO/VICODIN) 5-325 MG tablet  Has the patient contacted their pharmacy? Yes.   (Agent: If no, request that the patient contact the pharmacy for the refill.) Preferred Pharmacy (with phone number or street name): [facility]: Please be advised that RX refills may take up to 3 business days. We ask that you follow-up with your pharmacy.

## 2017-08-02 NOTE — Telephone Encounter (Signed)
Requesting refill hydrocodone apap to [facility] [place] Last refilled # 60 on 06/22/17 Last seen 07/11/17 UDS: 02/09/16

## 2017-08-03 MED ORDER — HYDROCODONE-ACETAMINOPHEN 5-325 MG PO TABS: 0.5000 | ORAL_TABLET | Freq: Four times a day (QID) | ORAL | 0 refills | Status: DC | PRN

## 2017-08-03 NOTE — Telephone Encounter (Signed)
Eprescribed.

## 2017-08-03 NOTE — Telephone Encounter (Signed)
PT called to check  On status of refill,  She is at pharmacy now

## 2017-08-03 NOTE — Telephone Encounter (Signed)
Pt notified refill hydrocodone done and pt will ck with pharmacy later today.

## 2017-08-09 ENCOUNTER — Ambulatory Visit: Payer: Self-pay

## 2017-08-09 NOTE — Telephone Encounter (Signed)
Pt. States she still has back and abdominal pain "that I have had a long time and I can't stand this pain any more." States she "has a lot of doctor appointments this week." Appointment made for next week.  Reason for Disposition . Abdominal pain is a chronic symptom (recurrent or ongoing AND present > 4 weeks)  Answer Assessment - Initial Assessment Questions 1. LOCATION: "Where does it hurt?"      Hurts "the whole stomach, especially by the belly button." 2. RADIATION: "Does the pain shoot anywhere else?" (e.g., chest, back)     Hurts in my back 3. ONSET: "When did the pain begin?" (e.g., minutes, hours or days ago)      Started in early April 4. SUDDEN: "Gradual or sudden onset?"     Gradual 5. PATTERN "Does the pain come and go, or is it constant?"    - If constant: "Is it getting better, staying the same, or worsening?"      (Note: Constant means the pain never goes away completely; most serious pain is constant and it progresses)     - If intermittent: "How long does it last?" "Do you have pain now?"     (Note: Intermittent means the pain goes away completely between bouts)     Constant 6. SEVERITY: "How bad is the pain?"  (e.g., Scale 1-10; mild, moderate, or severe)   - MILD (1-3): doesn't interfere with normal activities, abdomen soft and not tender to touch    - MODERATE (4-7): interferes with normal activities or awakens from sleep, tender to touch    - SEVERE (8-10): excruciating pain, doubled over, unable to do any normal activities      9 7. RECURRENT SYMPTOM: "Have you ever had this type of abdominal pain before?" If so, ask: "When was the last time?" and "What happened that time?"      No 8. CAUSE: "What do you think is causing the abdominal pain?"     Unsure 9. RELIEVING/AGGRAVATING FACTORS: "What makes it better or worse?" (e.g., movement, antacids, bowel movement)     No 10. OTHER SYMPTOMS: "Has there been any vomiting, diarrhea, constipation, or urine problems?"     No 11. PREGNANCY: "Is there any chance you are pregnant?" "When was your last menstrual period?"       No  Protocols used: ABDOMINAL PAIN - [facility]

## 2017-08-12 IMAGING — DX DG HUMERUS 2V *R*
2 series · 2 of 2 positions shown · non-contrast
Comparison: No recent prior.

CLINICAL DATA: Right upper arm pain.  Fall.

EXAM:
RIGHT HUMERUS - 2+ VIEW

[humerus ap]
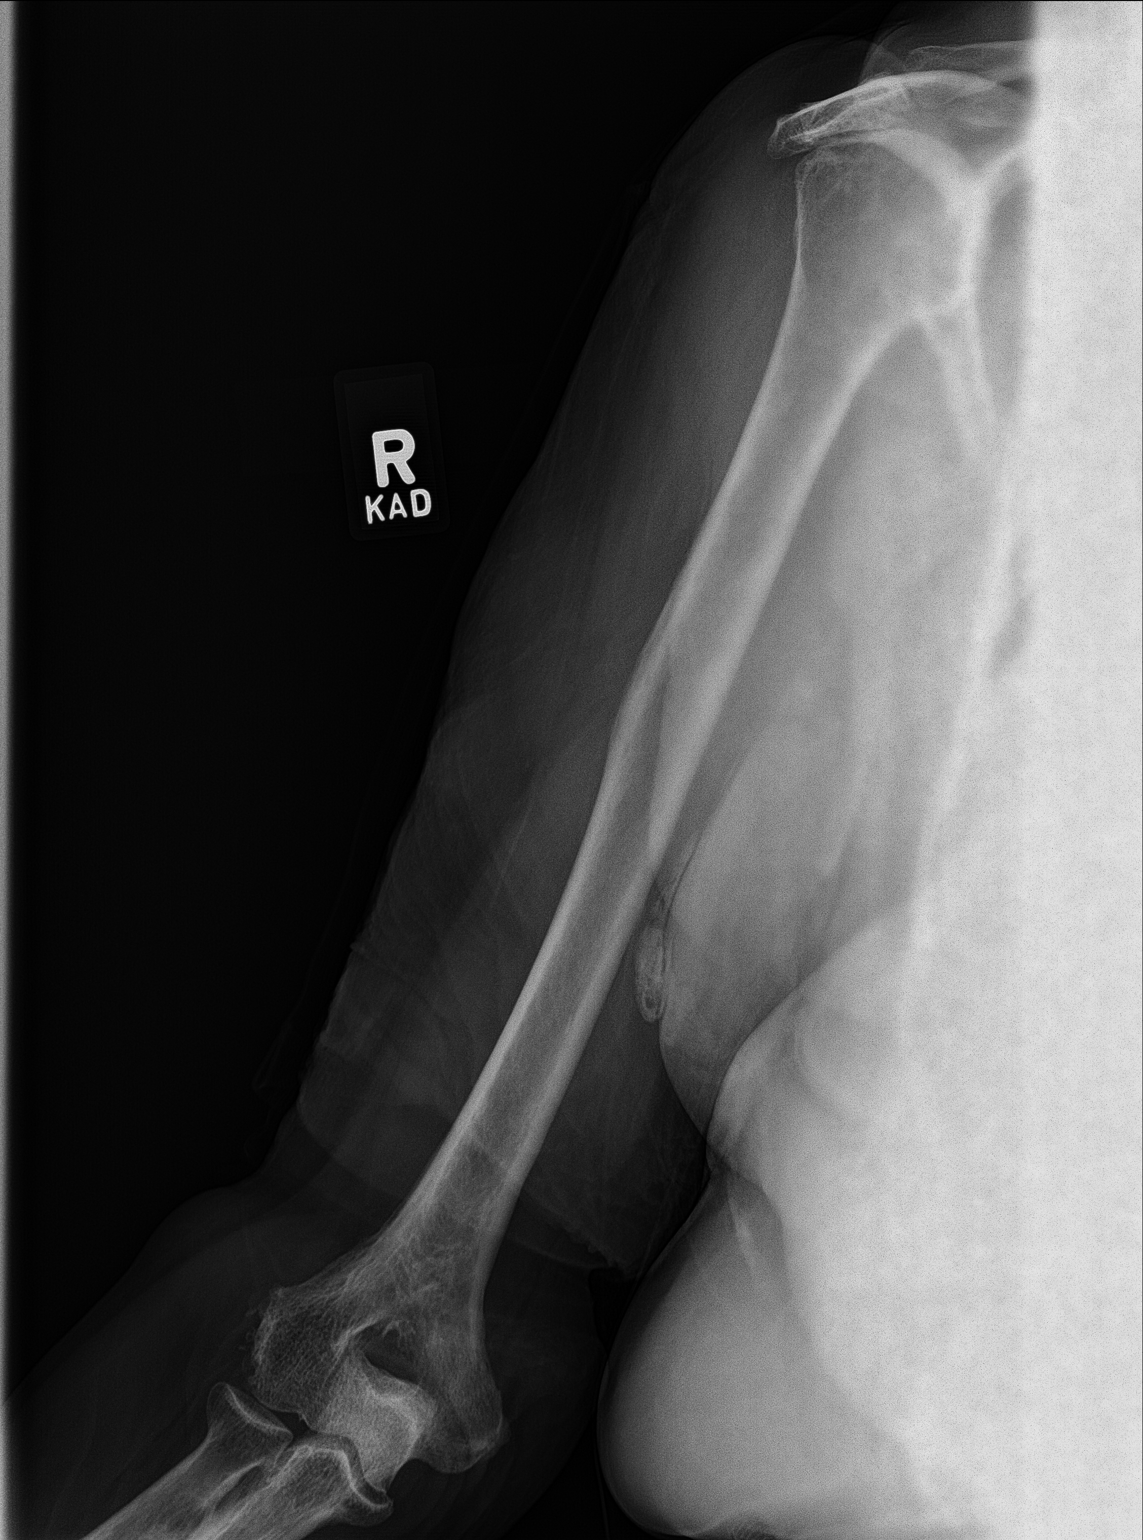

[humerus lat]
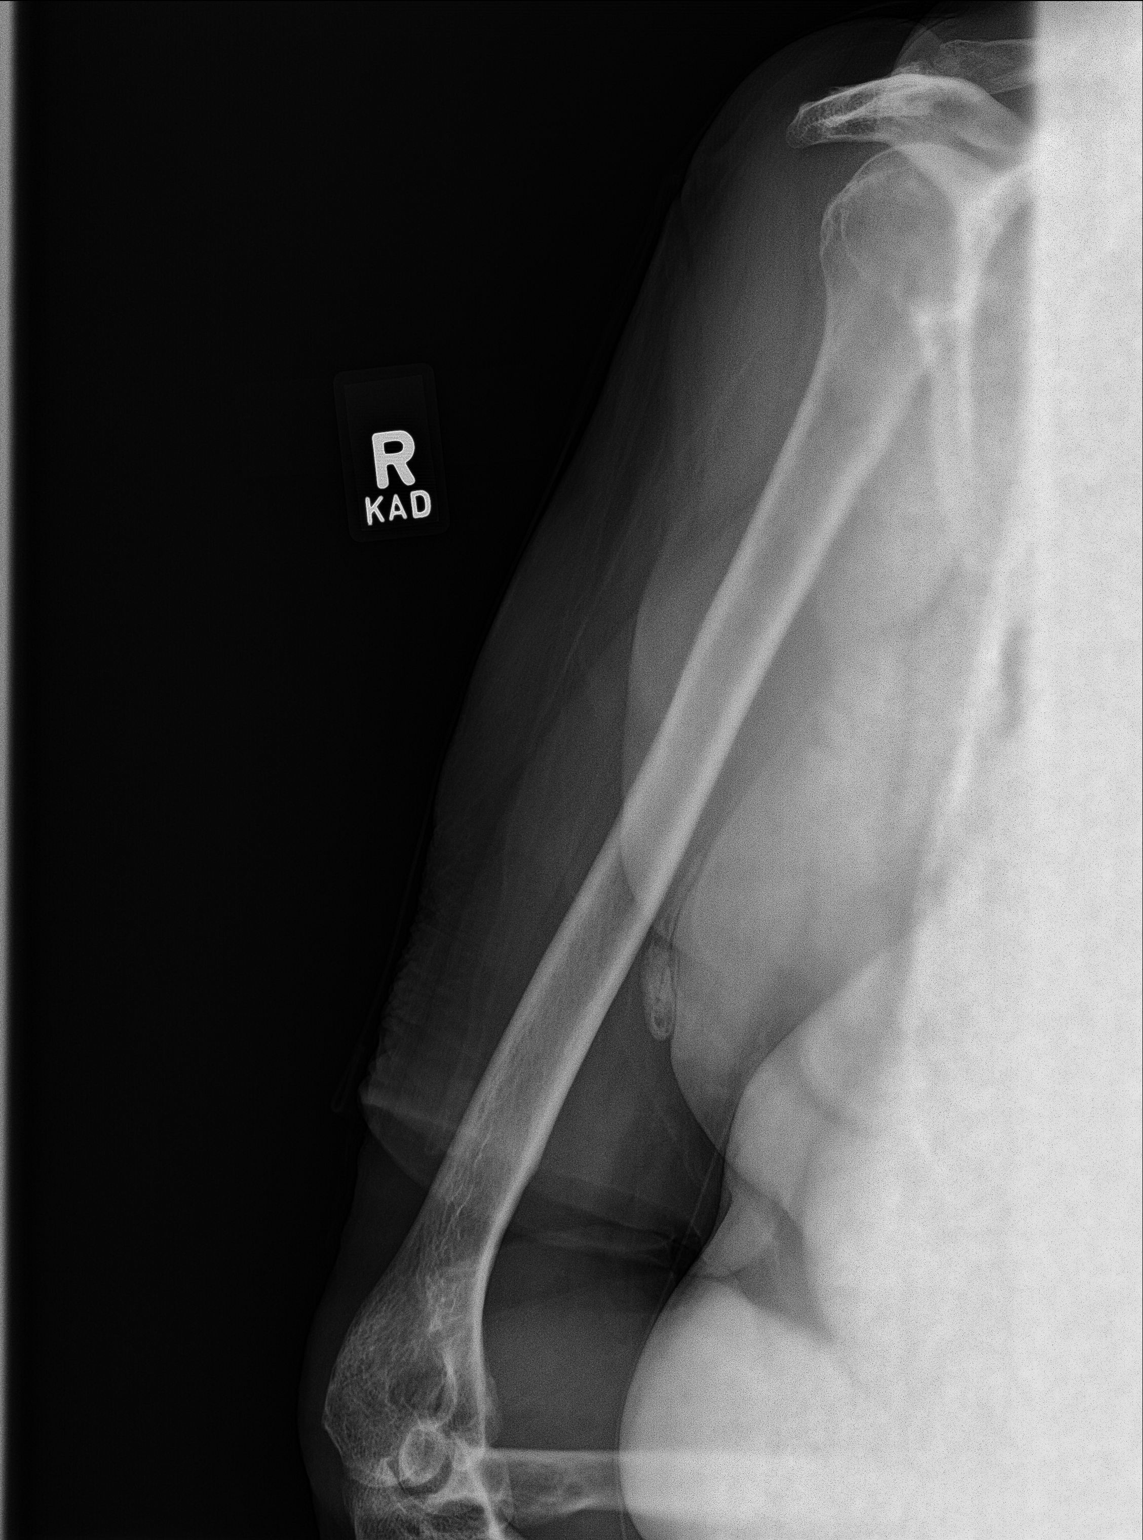

[2 of 2 positions shown; findings below may reference images not displayed]

FINDINGS: Acromioclavicular and glenohumeral degenerative change. No acute
bony or joint abnormality identified. No evidence of fracture or
dislocation. High-riding right shoulder consistent with chronic
rotator cuff tear .
IMPRESSION: Degenerative changes right shoulder and elbow. High-riding right
shoulder consistent with rotator cuff tear. No acute abnormality.

## 2017-08-14 ENCOUNTER — Ambulatory Visit (INDEPENDENT_AMBULATORY_CARE_PROVIDER_SITE_OTHER): Payer: Medicare Other | Admitting: Podiatry

## 2017-08-14 ENCOUNTER — Encounter: Payer: Self-pay | Admitting: Podiatry

## 2017-08-14 DIAGNOSIS — M79675 Pain in left toe(s): Secondary | ICD-10-CM

## 2017-08-14 DIAGNOSIS — Q828 Other specified congenital malformations of skin: Secondary | ICD-10-CM | POA: Diagnosis not present

## 2017-08-14 DIAGNOSIS — B351 Tinea unguium: Secondary | ICD-10-CM | POA: Diagnosis not present

## 2017-08-14 DIAGNOSIS — E1149 Type 2 diabetes mellitus with other diabetic neurological complication: Secondary | ICD-10-CM | POA: Diagnosis not present

## 2017-08-14 DIAGNOSIS — M79674 Pain in right toe(s): Secondary | ICD-10-CM

## 2017-08-15 ENCOUNTER — Encounter: Payer: Self-pay | Admitting: Family Medicine

## 2017-08-15 ENCOUNTER — Ambulatory Visit (INDEPENDENT_AMBULATORY_CARE_PROVIDER_SITE_OTHER)
Admission: RE | Admit: 2017-08-15 | Discharge: 2017-08-15 | Disposition: A | Discharge status: Home / Self Care | Payer: Medicare Other | Source: Ambulatory Visit | Attending: Family Medicine | Admitting: Family Medicine

## 2017-08-15 ENCOUNTER — Ambulatory Visit (INDEPENDENT_AMBULATORY_CARE_PROVIDER_SITE_OTHER): Payer: Medicare Other | Admitting: Family Medicine

## 2017-08-15 VITALS — BP 114/68 | HR 72 | Temp 98.2°F | Ht <= 58 in | Wt 146.5 lb

## 2017-08-15 DIAGNOSIS — G8929 Other chronic pain: Secondary | ICD-10-CM

## 2017-08-15 DIAGNOSIS — R1030 Lower abdominal pain, unspecified: Secondary | ICD-10-CM | POA: Diagnosis not present

## 2017-08-15 DIAGNOSIS — D509 Iron deficiency anemia, unspecified: Secondary | ICD-10-CM | POA: Diagnosis not present

## 2017-08-15 DIAGNOSIS — K279 Peptic ulcer, site unspecified, unspecified as acute or chronic, without hemorrhage or perforation: Secondary | ICD-10-CM

## 2017-08-15 DIAGNOSIS — M545 Low back pain: Secondary | ICD-10-CM

## 2017-08-15 DIAGNOSIS — M47816 Spondylosis without myelopathy or radiculopathy, lumbar region: Secondary | ICD-10-CM | POA: Diagnosis not present

## 2017-08-15 IMAGING — DX DG CHEST 1V
1 series · 1 of 1 positions shown · non-contrast
Comparison: Chest x-ray dated 01/14/2009.

CLINICAL DATA: Mental status changes, bilateral hip pain after
several recent falls.

EXAM:
CHEST 1 VIEW

[chest ap]
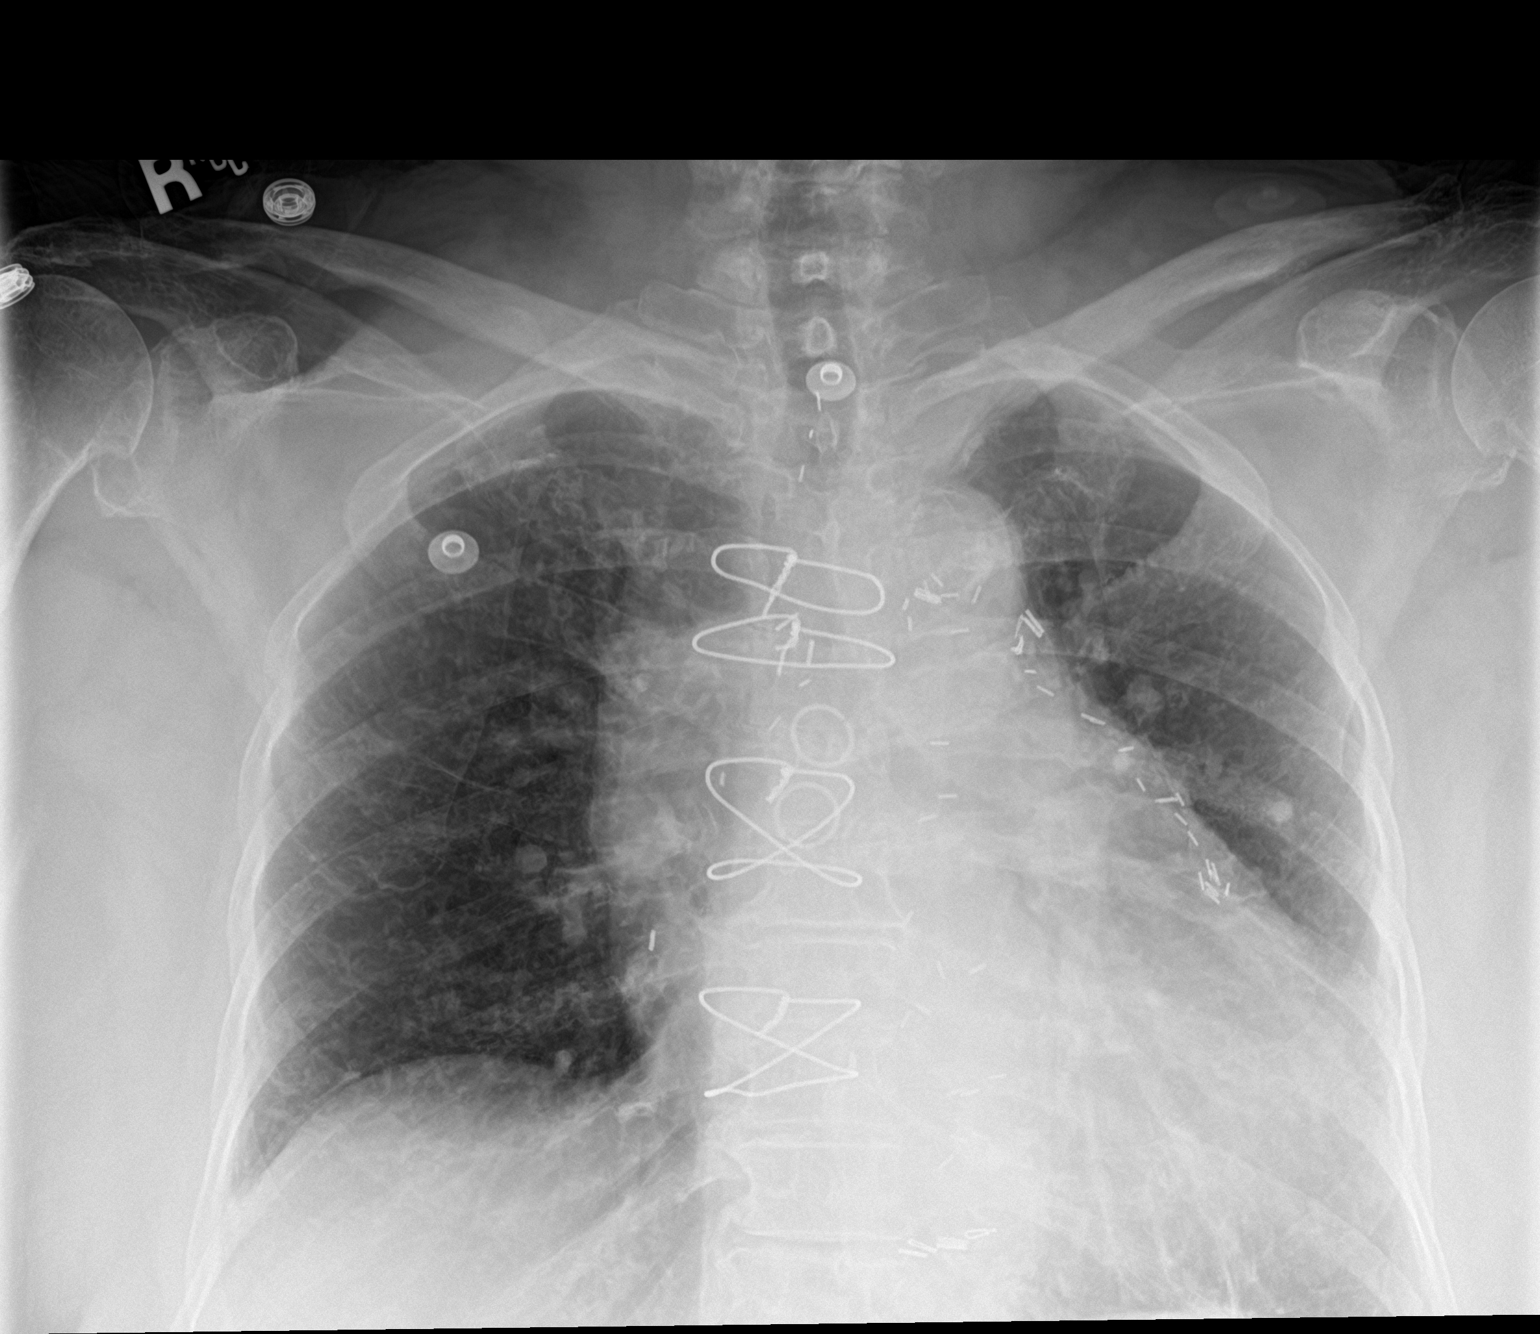

[1 of 1 positions shown; findings below may reference images not displayed]

FINDINGS: Median sternotomy wires appear intact and stable in alignment, with
surgical changes of associated CABG. Heart is enlarged, possibly
accentuated to some degree by the lordotic/supine patient
positioning. Atherosclerotic calcifications noted at the aortic
arch.

Mild interstitial prominence, also possibly accentuated by the
supine patient positioning. No confluent opacity to suggest a
developing pneumonia. No pleural effusion or pneumothorax seen. Left
lung base obscured by the enlarged heart.
IMPRESSION: 1. Cardiomegaly. Mild interstitial prominence is probably
accentuated by the supine patient positioning, possibly indicating a
mild CHF/volume overload. No overt alveolar pulmonary edema. No
evidence of pneumonia.
2. Aortic atherosclerosis.

## 2017-08-15 MED ORDER — RANITIDINE HCL 150 MG PO TABS: 150.0000 mg | ORAL_TABLET | Freq: Every day | ORAL | 3 refills | Status: DC

## 2017-08-15 MED ORDER — ALIGN 4 MG PO CAPS: 1.0000 | ORAL_CAPSULE | Freq: Every day | ORAL | Status: AC

## 2017-08-15 NOTE — Assessment & Plan Note (Addendum)
Chronic, deteriorated despite chronic hydrocodone.  Last imaging 2014. Update lumbar films today r/o acute fracture or other cause of worsening back pain. Known degenerative arthritis and mod severe spinal stenosis

## 2017-08-15 NOTE — Assessment & Plan Note (Signed)
Deteriorated over last few weeks. Reviewed last month's endo labs. Update labs (CBC, lipase). Consider abdominal imaging if not improving

## 2017-08-15 NOTE — Assessment & Plan Note (Signed)
H/o this, longterm on iron, latest CBC WNL. Update today, if staying well controlled, will trial off iron.

## 2017-08-15 NOTE — Assessment & Plan Note (Signed)
H/o this. Continue dexilant, hold iron tablet, add zantac 150mg  nightly.

## 2017-08-15 NOTE — Progress Notes (Signed)
BP 114/68 (BP Location: Left Arm, Patient Position: Sitting, Cuff Size: Normal)   Pulse 72   Temp 98.2 F (36.8 C) (Oral)   Ht 4\' 9"  (1.448 m)   Wt 146 lb 8 oz (66.5 kg)   SpO2 96%   BMI 31.70 kg/m    CC: discuss back pain, abd pain Subjective:    [name], female    DOB: July 27, 1927, 84 y.o.   MRN: [id number]  HPI: [name] is a 84 y.o. female presenting on 08/15/2017 for Back Pain (C/o mid low back pain. Now disturbing sleep. ) and Abdominal Pain (C/o pain in umbilical region and bloating. Says it is worsening.)   Chronic lower back pain presumed from osteoarthritis. This is worsening. Denies inciting trauma or injury to lower back, no numbness/weakness down legs or bowel/bladder incontinence Chronic lower abdominal and epigastric pain. She does have heartburn despite daily dexilant. + water brash.   Denies fever/chills, nausea/vomiting, diarrhea or constipation. No bowel changes, blood in stool, or gassiness or indigestion.   She takes iron tablets a few a week.  Relevant past medical, surgical, family and social history reviewed and updated as indicated. Interim medical history since our last visit reviewed. Allergies and medications reviewed and updated. Outpatient Medications Prior to Visit  Medication Sig Dispense Refill  . acetaminophen (TYLENOL) 500 MG tablet Take 500 mg by mouth every 6 (six) hours as needed for mild pain.    Marland Kitchen ALPRAZolam (XANAX) 1 MG tablet Take 0.5 mg by mouth 4 (four) times daily as needed for anxiety.     Marland Kitchen aspirin EC 81 MG tablet Take 81 mg by mouth daily.    Marland Kitchen DEXILANT 30 MG capsule TAKE 1 CAPSULE (30 MG TOTAL) BY MOUTH DAILY. 30 capsule 11  . diclofenac sodium (VOLTAREN) 1 % GEL Apply 1 application topically 3 (three) times daily. 1 Tube 1  . ferrous sulfate 325 (65 FE) MG tablet Take 1 tablet (325 mg total) by mouth every Monday, Wednesday, and Friday.    . furosemide (LASIX) 20 MG tablet TAKE 0.5 TABLETS (10 MG TOTAL)  BY MOUTH DAILY. 45 tablet 3  . glucosamine-chondroitin 500-400 MG tablet Take 1 tablet by mouth daily.     Marland Kitchen glucose blood (ONE TOUCH ULTRA TEST) test strip USE AS INSTRUCTED TO CHECK BLOOD SUGAR ONCE A DAY Dx code: E11.9 50 each 5  . HYDROcodone-acetaminophen (NORCO/VICODIN) 5-325 MG tablet Take 0.5-1 tablets by mouth every 6 (six) hours as needed for moderate pain. 60 tablet 0  . loperamide (IMODIUM) 2 MG capsule Take 2 mg by mouth 4 (four) times daily as needed for diarrhea or loose stools.    . metoprolol tartrate (LOPRESSOR) 25 MG tablet TAKE ONE AND HALF TABLETS (37.5 MG TOTAL) BY MOUTH 2 (TWO) TIMES DAILY. 270 tablet 0  . Multiple Vitamin (MULTIVITAMIN) tablet Take 1 tablet by mouth daily. Patient reports takes a new vitamin to replace her centrum as recommended by her eye doctor.    . nitroGLYCERIN (NITROSTAT) 0.4 MG SL tablet Place 1 tablet (0.4 mg total) under the tongue every 5 (five) minutes as needed for chest pain. 25 tablet 3  . nystatin-triamcinolone ointment (MYCOLOG) APPLY 1 APPLICATION TOPICALLY 2 (TWO) TIMES DAILY. AS NEEDED FOR ITCHING 60 g 1  . pioglitazone (ACTOS) 30 MG tablet Take 1 tablet (30 mg total) by mouth daily. 90 tablet 3  . pravastatin (PRAVACHOL) 20 MG tablet TAKE 1 TABLET BY MOUTH EVERY DAY 30 tablet 3  .  ramipril (ALTACE) 5 MG capsule TAKE ONE CAPSULE BY MOUTH EVERYDAY FOR BLOOD PRESSURE 30 capsule 6  . traZODone (DESYREL) 150 MG tablet Take 150 mg by mouth at bedtime.     . triamcinolone cream (KENALOG) 0.1 % APPLY 1 APPLICATION TOPICALLY 2 (TWO) TIMES DAILY.  1  . vitamin B-12 (CYANOCOBALAMIN) 1000 MCG tablet Take 1,000 mcg by mouth daily.    . vitamin E 400 UNIT capsule Take 400 Units by mouth daily.    Marland Kitchen acidophilus (RISAQUAD) CAPS capsule Take 1 capsule by mouth daily.     No facility-administered medications prior to visit.      Per HPI unless specifically indicated in ROS section below Review of Systems     Objective:    BP 114/68 (BP Location:  Left Arm, Patient Position: Sitting, Cuff Size: Normal)   Pulse 72   Temp 98.2 F (36.8 C) (Oral)   Ht 4\' 9"  (1.448 m)   Wt 146 lb 8 oz (66.5 kg)   SpO2 96%   BMI 31.70 kg/m   Wt Readings from Last 3 Encounters:  08/15/17 146 lb 8 oz (66.5 kg)  07/26/17 145 lb 12.8 oz (66.1 kg)  07/11/17 144 lb (65.3 kg)    Physical Exam  Constitutional: She appears well-developed and well-nourished. She does not appear ill.  HENT:  Mouth/Throat: Oropharynx is clear and moist. No oropharyngeal exudate.  Abdominal: Normal appearance. Bowel sounds are increased. There is tenderness (moderate) in the right lower quadrant, epigastric area, periumbilical area and suprapubic area. There is no rigidity, no rebound and negative Murphy's sign.  Musculoskeletal:  Discomfort lower lumbar midline spine No significant paraspinous mm tenderness  Nursing note and vitals reviewed.  Results for orders placed or performed in visit on 07/26/17  CBC with Differential/Platelet  Result Value Ref Range   WBC 8.4 4.0 - 10.5 K/uL   RBC 4.17 3.87 - 5.11 Mil/uL   Hemoglobin 12.9 12.0 - 15.0 g/dL   HCT 38.7 36.0 - 46.0 %   MCV 93.0 78.0 - 100.0 fl   MCHC 33.4 30.0 - 36.0 g/dL   RDW 13.7 11.5 - 15.5 %   Platelets 219.0 150.0 - 400.0 K/uL   Neutrophils Relative % 65.7 43.0 - 77.0 %   Lymphocytes Relative 21.8 12.0 - 46.0 %   Monocytes Relative 8.5 3.0 - 12.0 %   Eosinophils Relative 3.1 0.0 - 5.0 %   Basophils Relative 0.9 0.0 - 3.0 %   Neutro Abs 5.5 1.4 - 7.7 K/uL   Lymphs Abs 1.8 0.7 - 4.0 K/uL   Monocytes Absolute 0.7 0.1 - 1.0 K/uL   Eosinophils Absolute 0.3 0.0 - 0.7 K/uL   Basophils Absolute 0.1 0.0 - 0.1 K/uL  Urinalysis, Routine w reflex microscopic  Result Value Ref Range   Color, Urine YELLOW Yellow;Lt. Yellow   APPearance CLEAR Clear   Specific Gravity, Urine 1.020 1.000 - 1.030   pH 5.5 5.0 - 8.0   Total Protein, Urine NEGATIVE Negative   Urine Glucose NEGATIVE Negative   Ketones, ur NEGATIVE  Negative   Bilirubin Urine NEGATIVE Negative   Hgb urine dipstick NEGATIVE Negative   Urobilinogen, UA 0.2 0.0 - 1.0   Leukocytes, UA SMALL (A) Negative   Nitrite NEGATIVE Negative   WBC, UA 7-10/hpf (A) 0-2/hpf   RBC / HPF 0-2/hpf 0-2/hpf   Squamous Epithelial / LPF Rare(0-4/hpf) Rare(0-4/hpf)  Microalbumin / creatinine urine ratio  Result Value Ref Range   Microalb, Ur <0.7 0.0 - 1.9 mg/dL  Creatinine,U 46.3 mg/dL   Microalb Creat Ratio 1.5 0.0 - 30.0 mg/g  Comprehensive metabolic panel  Result Value Ref Range   Sodium 137 135 - 145 mEq/L   Potassium 4.3 3.5 - 5.1 mEq/L   Chloride 102 96 - 112 mEq/L   CO2 32 19 - 32 mEq/L   Glucose, Bld 77 70 - 99 mg/dL   BUN 26 (H) 6 - 23 mg/dL   Creatinine, Ser 0.87 0.40 - 1.20 mg/dL   Total Bilirubin 0.5 0.2 - 1.2 mg/dL   Alkaline Phosphatase 102 39 - 117 U/L   AST 21 0 - 37 U/L   ALT 15 0 - 35 U/L   Total Protein 6.6 6.0 - 8.3 g/dL   Albumin 3.7 3.5 - 5.2 g/dL   Calcium 9.3 8.4 - 10.5 mg/dL   GFR 65.29 >60.00 mL/min  POCT HgB A1C  Result Value Ref Range   Hemoglobin A1C 6.5       Assessment & Plan:   Problem List Items Addressed This Visit    Abdominal pain - Primary    Deteriorated over last few weeks. Reviewed last month's endo labs. Update labs (CBC, lipase). Consider abdominal imaging if not improving      Relevant Orders   Lipase   CBC with Differential/Platelet   Chronic lower back pain    Chronic, deteriorated despite chronic hydrocodone.  Last imaging 2014. Update lumbar films today r/o acute fracture or other cause of worsening back pain. Known degenerative arthritis and mod severe spinal stenosis      Relevant Orders   DG Lumbar Spine Complete   Microcytic anemia    H/o this, longterm on iron, latest CBC WNL. Update today, if staying well controlled, will trial off iron.       Relevant Orders   Ferritin   Peptic ulcer disease    H/o this. Continue dexilant, hold iron tablet, add zantac 150mg  nightly.        Relevant Orders   CBC with Differential/Platelet       Meds ordered this encounter  Medications  . Probiotic Product (ALIGN) 4 MG CAPS    Sig: Take 1 capsule (4 mg total) by mouth daily.  . ranitidine (ZANTAC) 150 MG tablet    Sig: Take 1 tablet (150 mg total) by mouth at bedtime.    Dispense:  30 tablet    Refill:  3   Orders Placed This Encounter  Procedures  . DG Lumbar Spine Complete    Standing Status:   Future    Number of Occurrences:   1    Standing Expiration Date:   10/16/2018    Order Specific Question:   Reason for Exam (SYMPTOM  OR DIAGNOSIS REQUIRED)    Answer:   acute on chronic lumbar back pain    Order Specific Question:   Preferred imaging location?    Answer:   [facility]    Order Specific Question:   Radiology Contrast Protocol - do NOT remove file path    Answer:   [email or web address]  . Lipase  . CBC with Differential/Platelet  . Ferritin    Follow up plan: Return if symptoms worsen or fail to improve.  Ria Bush, MD

## 2017-08-15 NOTE — Patient Instructions (Addendum)
Try off the iron pill  Continue dexilant (heartburn pill).  Add on nightly zantac (ranitidine).  Xray of lower back today.  Labs today. We will be in touch with results and plan.

## 2017-08-16 LAB — CBC WITH DIFFERENTIAL/PLATELET
BASOS ABS: 0.1 10*3/uL (ref 0.0–0.1)
Basophils Relative: 1 % (ref 0.0–3.0)
Eosinophils Absolute: 0.5 10*3/uL (ref 0.0–0.7)
Eosinophils Relative: 5.8 % — ABNORMAL HIGH (ref 0.0–5.0)
HEMATOCRIT: 40.4 % (ref 36.0–46.0)
Hemoglobin: 13.4 g/dL (ref 12.0–15.0)
Lymphocytes Relative: 22.9 % (ref 12.0–46.0)
Lymphs Abs: 2.2 10*3/uL (ref 0.7–4.0)
MCHC: 33.2 g/dL (ref 30.0–36.0)
MCV: 93.3 fl (ref 78.0–100.0)
MONOS PCT: 8.7 % (ref 3.0–12.0)
Monocytes Absolute: 0.8 10*3/uL (ref 0.1–1.0)
Neutro Abs: 5.8 10*3/uL (ref 1.4–7.7)
Neutrophils Relative %: 61.6 % (ref 43.0–77.0)
Platelets: 241 10*3/uL (ref 150.0–400.0)
RBC: 4.33 Mil/uL (ref 3.87–5.11)
RDW: 14.4 % (ref 11.5–15.5)
WBC: 9.4 10*3/uL (ref 4.0–10.5)

## 2017-08-16 LAB — FERRITIN: Ferritin: 132.9 ng/mL (ref 10.0–291.0)

## 2017-08-16 LAB — LIPASE: LIPASE: 33 U/L (ref 11.0–59.0)

## 2017-08-16 NOTE — Progress Notes (Signed)
Subjective: 84 y.o. returns the office today for painful, elongated, thickened toenails which she cannot trim herself as well as for painful callus on the ball of her right foot.  She states that she is doing vastly on the area to help with the pain.  She has not been wearing the offloading pads but she states that she has tried several.  Denies any redness or drainage or any swelling to the toenail callus sites.  She has no new concerns today other than she is asking about the status of her diabetic shoes. Denies any acute changes since last appointment and no new complaints today. Denies any systemic complaints such as fevers, chills, nausea, vomiting.   Objective: AAO 3, NAD DP/PT pulses palpable 1/4, CRT less than 3 seconds Protective sensation decreased with Simms Weinstein monofilament Nails hypertrophic, dystrophic, elongated, brittle, discolored 10. There is tenderness overlying the nails 1-5 bilaterally. There is no surrounding erythema or drainage along the nail sites. Hyperkeratic lesion to the right foot sub met 2. Upon debridement, no underlying ulceration, drainage, or signs of infection.  Prominent metatarsal head plantar. Hammertoes present.  Varicose veins are present.   No open lesions or pre-ulcerative lesions are identified. No other areas of tenderness bilateral lower extremities. No overlying edema, erythema, increased warmth. No pain with calf compression, swelling, warmth, erythema.  Assessment: Patient presents with symptomatic onychomycosis, hyperkeratotic lesion  Plan: -Treatment options including alternatives, risks, complications were discussed -Hyperkeratotic lesion sharply debrided x 1 without complications or bleeding. -Nails sharply debrided 10 without complication/bleeding. -I really submitted the paperwork for precertification diabetic shoes as the paperwork is expired.  I gave the paperwork to [facility].  We will get her rescheduled to see [facility] for  measurement. -Discussed daily foot inspection. If there are any changes, to call the office immediately.  -Follow-up in 3 months or sooner if any problems are to arise. In the meantime, encouraged to call the office with any questions, concerns, changes symptoms.  Celesta Gentile, DPM

## 2017-08-17 ENCOUNTER — Other Ambulatory Visit: Payer: Self-pay | Admitting: Family Medicine

## 2017-08-17 ENCOUNTER — Telehealth: Payer: Self-pay

## 2017-08-17 DIAGNOSIS — M545 Low back pain: Secondary | ICD-10-CM

## 2017-08-17 DIAGNOSIS — G8929 Other chronic pain: Secondary | ICD-10-CM

## 2017-08-17 DIAGNOSIS — M533 Sacrococcygeal disorders, not elsewhere classified: Secondary | ICD-10-CM

## 2017-08-17 NOTE — Telephone Encounter (Signed)
Copied from [place] [id number]. Topic: General - Other >> Aug 17, 2017 11:49 AM Yvette Rack wrote: Reason for CRM: Pt requests lab and x-ray results. Pt requested a return call to discuss both lab and x-ray results. Cb# [phone].  >> Aug 17, 2017 11:58 AM Modena Nunnery, CMA wrote: Spoke to pt and advised xray resulted but not annotated. Advised CMA will contact her with results when they have been reviewed and are available >> Aug 17, 2017  1:55 PM Margot Ables wrote: Pt called stating no one has called her about results of labs and xray. I advised that Earl Lagos noting speaking to her at 11:58am and that we are waiting for Dr. Darnell Level to review and notate. Pt stated no one had called. I advised again that Dr. Darnell Level is out of the office this afternoon and we cannot advise her further until he has had time to review and notate her results.

## 2017-08-17 NOTE — Telephone Encounter (Signed)
Spoke with pt.  [See labs, 08/15/17 and imaging, 08/15/17.]

## 2017-08-17 NOTE — Telephone Encounter (Signed)
See results note. 

## 2017-08-23 DIAGNOSIS — H353131 Nonexudative age-related macular degeneration, bilateral, early dry stage: Secondary | ICD-10-CM | POA: Diagnosis not present

## 2017-09-01 ENCOUNTER — Other Ambulatory Visit: Payer: Self-pay

## 2017-09-01 MED ORDER — RAMIPRIL 5 MG PO CAPS: ORAL_CAPSULE | ORAL | 1 refills | Status: DC

## 2017-09-01 NOTE — Telephone Encounter (Signed)
Received faxed 90-day rx request for ramapril.  Sent 90-day rx.

## 2017-09-05 ENCOUNTER — Other Ambulatory Visit: Payer: Medicare Other

## 2017-09-11 ENCOUNTER — Telehealth: Payer: Self-pay | Admitting: Family Medicine

## 2017-09-11 MED ORDER — HYDROCODONE-ACETAMINOPHEN 5-325 MG PO TABS: 0.5000 | ORAL_TABLET | Freq: Four times a day (QID) | ORAL | 0 refills | Status: DC | PRN

## 2017-09-11 NOTE — Telephone Encounter (Signed)
Pt seen at friend's appt, requests hydrocodone refilled.

## 2017-09-25 DIAGNOSIS — M5416 Radiculopathy, lumbar region: Secondary | ICD-10-CM | POA: Diagnosis not present

## 2017-09-25 DIAGNOSIS — M5136 Other intervertebral disc degeneration, lumbar region: Secondary | ICD-10-CM | POA: Diagnosis not present

## 2017-09-26 ENCOUNTER — Other Ambulatory Visit: Payer: Self-pay | Admitting: Family Medicine

## 2017-10-04 ENCOUNTER — Other Ambulatory Visit: Payer: Self-pay | Admitting: Gynecology

## 2017-10-11 ENCOUNTER — Encounter: Payer: Self-pay | Admitting: *Deleted

## 2017-10-11 ENCOUNTER — Ambulatory Visit (INDEPENDENT_AMBULATORY_CARE_PROVIDER_SITE_OTHER): Payer: Medicare Other | Admitting: Family Medicine

## 2017-10-11 ENCOUNTER — Encounter: Payer: Self-pay | Admitting: Family Medicine

## 2017-10-11 VITALS — BP 120/70 | HR 73 | Temp 98.1°F | Ht <= 58 in | Wt 147.0 lb

## 2017-10-11 DIAGNOSIS — G8929 Other chronic pain: Secondary | ICD-10-CM | POA: Diagnosis not present

## 2017-10-11 DIAGNOSIS — M545 Low back pain: Secondary | ICD-10-CM | POA: Diagnosis not present

## 2017-10-11 DIAGNOSIS — R1084 Generalized abdominal pain: Secondary | ICD-10-CM

## 2017-10-11 DIAGNOSIS — R011 Cardiac murmur, unspecified: Secondary | ICD-10-CM | POA: Diagnosis not present

## 2017-10-11 DIAGNOSIS — F411 Generalized anxiety disorder: Secondary | ICD-10-CM | POA: Diagnosis not present

## 2017-10-11 MED ORDER — HYDROCODONE-ACETAMINOPHEN 5-325 MG PO TABS: 0.5000 | ORAL_TABLET | Freq: Four times a day (QID) | ORAL | 0 refills | Status: DC | PRN

## 2017-10-11 NOTE — Assessment & Plan Note (Signed)
Ongoing struggle over work and home stressors. Support provided.

## 2017-10-11 NOTE — Assessment & Plan Note (Signed)
Minimal. Anticipate inconsequential.

## 2017-10-11 NOTE — Progress Notes (Signed)
BP 120/70 (BP Location: Left Arm, Patient Position: Sitting, Cuff Size: Normal)   Pulse 73   Temp 98.1 F (36.7 C) (Oral)   Ht 4\' 9"  (1.448 m)   Wt 147 lb (66.7 kg)   SpO2 97%   BMI 31.81 kg/m    CC: pain management visit Subjective:    [name], female    DOB: Dec 26, 1927, 84 y.o.   MRN: [id number]  HPI: [name] is a 84 y.o. female presenting on 10/11/2017 for Pain management (Here for 3 mo pain f/u. )   Known chronic lower back pain from osteoarthritis T12-L4, chronic sacroiliitis with progressed SI ankylosis and lumbar scoliosis. Also has h/o mod-severe spinal stenosis. Prior course of PT and epidural steroid injections were not effective. Last visit we referred patient to PMR for further evaluation. Pt declined repeat PT or steroid injections. Pt declines sacroiliac injection as well.   Chronic pain regimen is hydrocodone 1/2 tab QID and tylenol ES 1 tab BID PRN.  Tolerates narcotic well - denies constipation. Occasional loose stools managed with imodium.  Requests hydrocodone refill today.   Remains concerned about overworking - she has worked at [employer] for 66 yrs. 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   Lumbar film IMPRESSION: 1. Chronic bilateral sacroiliitis with possible bilateral SI joint ankylosis since 2014. 2. Progressed chronic dextroconvex upper lumbar scoliosis and grade 1 anterolisthesis at L5-S1. 3. Advanced chronic disc and endplate degeneration from T12-L1 to L3-L4. 4.  No acute osseous abnormality identified. 5.  Aortic Atherosclerosis (ICD10-I70.0). Electronically Signed   By: Genevie Ann M.D.   On: 08/16/2017 08:58  Relevant past medical, surgical, family and social history reviewed and updated as indicated. Interim medical history since our last visit reviewed. Allergies and medications reviewed and updated. Outpatient Medications Prior to Visit    Medication Sig Dispense Refill  . acetaminophen (TYLENOL) 500 MG tablet Take 500 mg by mouth every 6 (six) hours as needed for mild pain.    Marland Kitchen ALPRAZolam (XANAX) 1 MG tablet Take 0.5 mg by mouth 4 (four) times daily as needed for anxiety.     Marland Kitchen aspirin EC 81 MG tablet Take 81 mg by mouth daily.    Marland Kitchen DEXILANT 30 MG capsule TAKE 1 CAPSULE (30 MG TOTAL) BY MOUTH DAILY. 30 capsule 11  . diclofenac sodium (VOLTAREN) 1 % GEL Apply 1 application topically 3 (three) times daily. 1 Tube 1  . furosemide (LASIX) 20 MG tablet TAKE 0.5 TABLETS (10 MG TOTAL) BY MOUTH DAILY. 45 tablet 3  . glucosamine-chondroitin 500-400 MG tablet Take 1 tablet by mouth daily.     Marland Kitchen glucose blood (ONE TOUCH ULTRA TEST) test strip USE AS INSTRUCTED TO CHECK BLOOD SUGAR ONCE A DAY Dx code: E11.9 50 each 5  . loperamide (IMODIUM) 2 MG capsule Take 2 mg by mouth 4 (four) times daily as needed for diarrhea or loose stools.    . metoprolol tartrate (LOPRESSOR) 25 MG tablet TAKE ONE AND HALF TABLETS (37.5 MG TOTAL) BY MOUTH 2 (TWO) TIMES DAILY. 270 tablet 0  . Multiple Vitamin (MULTIVITAMIN) tablet Take 1 tablet by mouth daily. Patient reports takes a new vitamin to replace her centrum as recommended by her eye doctor.    . nitroGLYCERIN (NITROSTAT) 0.4 MG SL tablet Place 1 tablet (0.4 mg total) under the tongue every 5 (five) minutes as needed for chest pain. 25 tablet 3  .  nystatin-triamcinolone ointment (MYCOLOG) APPLY 1 APPLICATION TOPICALLY 2 (TWO) TIMES DAILY. AS NEEDED FOR ITCHING 60 g 1  . pioglitazone (ACTOS) 30 MG tablet Take 1 tablet (30 mg total) by mouth daily. 90 tablet 3  . pravastatin (PRAVACHOL) 20 MG tablet TAKE 1 TABLET BY MOUTH EVERY DAY 30 tablet 1  . Probiotic Product (ALIGN) 4 MG CAPS Take 1 capsule (4 mg total) by mouth daily.    . ramipril (ALTACE) 5 MG capsule TAKE ONE CAPSULE BY MOUTH EVERYDAY FOR BLOOD PRESSURE 90 capsule 1  . ranitidine (ZANTAC) 150 MG tablet Take 1 tablet (150 mg total) by mouth at  bedtime. 30 tablet 3  . traZODone (DESYREL) 150 MG tablet Take 150 mg by mouth at bedtime.     . triamcinolone cream (KENALOG) 0.1 % APPLY 1 APPLICATION TOPICALLY 2 (TWO) TIMES DAILY.  1  . vitamin B-12 (CYANOCOBALAMIN) 1000 MCG tablet Take 1,000 mcg by mouth daily.    . vitamin E 400 UNIT capsule Take 400 Units by mouth daily.    . ferrous sulfate 325 (65 FE) MG tablet Take 1 tablet (325 mg total) by mouth every Monday, Wednesday, and Friday.    Marland Kitchen HYDROcodone-acetaminophen (NORCO/VICODIN) 5-325 MG tablet Take 0.5-1 tablets by mouth every 6 (six) hours as needed for moderate pain. 60 tablet 0   No facility-administered medications prior to visit.      Per HPI unless specifically indicated in ROS section below Review of Systems     Objective:    BP 120/70 (BP Location: Left Arm, Patient Position: Sitting, Cuff Size: Normal)   Pulse 73   Temp 98.1 F (36.7 C) (Oral)   Ht 4\' 9"  (1.448 m)   Wt 147 lb (66.7 kg)   SpO2 97%   BMI 31.81 kg/m   Wt Readings from Last 3 Encounters:  10/11/17 147 lb (66.7 kg)  08/15/17 146 lb 8 oz (66.5 kg)  07/26/17 145 lb 12.8 oz (66.1 kg)    Physical Exam  Constitutional: She appears well-developed and well-nourished. No distress.  HENT:  Mouth/Throat: Oropharynx is clear and moist. No oropharyngeal exudate.  Cardiovascular: Normal rate and regular rhythm.  Murmur (1/6 systolic murmur) heard. Musculoskeletal: She exhibits no edema.  No erythema of lower extremities  Psychiatric:  Tearful with discussion of work and home stressors  Nursing note and vitals reviewed.     Assessment & Plan:   Problem List Items Addressed This Visit    Systolic murmur    Minimal. Anticipate inconsequential.      GAD (generalized anxiety disorder)    Ongoing struggle over work and home stressors. Support provided.       Encounter for chronic pain management - Primary    [place] CSRS reviewed and appropriate.  Pain regimen reviewed, tolerated well. Update UDS and  controlled substance agreement today.       Relevant Orders   Pain Mgmt, Profile 8 w/Conf, U   Chronic lower back pain    Has seen [facility] - declined further eval/treatment. Will continue current regimen which is stable and tolerated well.       Relevant Medications   HYDROcodone-acetaminophen (NORCO/VICODIN) 5-325 MG tablet   Abdominal pain    This may have improved off iron tablet.s          Meds ordered this encounter  Medications  . HYDROcodone-acetaminophen (NORCO/VICODIN) 5-325 MG tablet    Sig: Take 0.5-1 tablets by mouth every 6 (six) hours as needed for moderate pain.  Dispense:  60 tablet    Refill:  0   Orders Placed This Encounter  Procedures  . Pain Mgmt, Profile 8 w/Conf, U    Order Specific Question:   Prescribed drugs 1:    Answer:   HYDROCODONE    Order Specific Question:   Prescribed drugs 2:    Answer:   ALPRAZOLAM    Follow up plan: Return in about 3 months (around 01/11/2018) for medicare wellness visit, follow up visit.  Ria Bush, MD

## 2017-10-11 NOTE — Assessment & Plan Note (Signed)
Has seen [facility] - declined further eval/treatment. Will continue current regimen which is stable and tolerated well.

## 2017-10-11 NOTE — Assessment & Plan Note (Signed)
This may have improved off iron tablet.s

## 2017-10-11 NOTE — Assessment & Plan Note (Addendum)
[place] CSRS reviewed and appropriate.  Pain regimen reviewed, tolerated well. Update UDS and controlled substance agreement today.

## 2017-10-11 NOTE — Patient Instructions (Addendum)
You are doing well with hydrocodone.  Update urine drug screen and pain contract today. Ok to take extra tylenol as needed.  Return as needed or in 3 months for wellness visit You have very faint murmur - we can just watch this.

## 2017-10-16 LAB — PAIN MGMT, PROFILE 8 W/CONF, U
6 Acetylmorphine: NEGATIVE ng/mL (ref <10)
ALPHAHYDROXYMIDAZOLAM: NEGATIVE ng/mL (ref <50)
ALPHAHYDROXYTRIAZOLAM: NEGATIVE ng/mL (ref <50)
AMINOCLONAZEPAM: NEGATIVE ng/mL (ref <25)
AMPHETAMINES: NEGATIVE ng/mL (ref <500)
Alcohol Metabolites: NEGATIVE ng/mL (ref <500)
Alphahydroxyalprazolam: 407 ng/mL — ABNORMAL HIGH (ref <25)
BUPRENORPHINE, URINE: NEGATIVE ng/mL (ref <5)
Benzodiazepines: POSITIVE ng/mL — AB (ref <100)
CODEINE: NEGATIVE ng/mL (ref <50)
CREATININE: 85.5 mg/dL
Cocaine Metabolite: NEGATIVE ng/mL (ref <150)
HYDROMORPHONE: 324 ng/mL — AB (ref <50)
Hydrocodone: 593 ng/mL — ABNORMAL HIGH (ref <50)
Hydroxyethylflurazepam: NEGATIVE ng/mL (ref <50)
Lorazepam: NEGATIVE ng/mL (ref <50)
MDMA: NEGATIVE ng/mL (ref <500)
Marijuana Metabolite: NEGATIVE ng/mL (ref <20)
Morphine: NEGATIVE ng/mL (ref <50)
NORDIAZEPAM: NEGATIVE ng/mL (ref <50)
NORHYDROCODONE: 624 ng/mL — AB (ref <50)
OXAZEPAM: NEGATIVE ng/mL (ref <50)
OXYCODONE: NEGATIVE ng/mL (ref <100)
Opiates: POSITIVE ng/mL — AB (ref <100)
Oxidant: NEGATIVE ug/mL (ref <200)
PH: 5.81 (ref 4.5–9.0)
TEMAZEPAM: NEGATIVE ng/mL (ref <50)

## 2017-10-19 ENCOUNTER — Ambulatory Visit (INDEPENDENT_AMBULATORY_CARE_PROVIDER_SITE_OTHER): Payer: Medicare Other | Admitting: Family Medicine

## 2017-10-19 ENCOUNTER — Encounter: Payer: Self-pay | Admitting: Family Medicine

## 2017-10-19 VITALS — BP 108/68 | HR 69 | Temp 98.6°F | Ht <= 58 in | Wt 147.8 lb

## 2017-10-19 DIAGNOSIS — Z23 Encounter for immunization: Secondary | ICD-10-CM

## 2017-10-19 DIAGNOSIS — I5030 Unspecified diastolic (congestive) heart failure: Secondary | ICD-10-CM | POA: Diagnosis not present

## 2017-10-19 DIAGNOSIS — S81811A Laceration without foreign body, right lower leg, initial encounter: Secondary | ICD-10-CM | POA: Diagnosis not present

## 2017-10-19 DIAGNOSIS — E1151 Type 2 diabetes mellitus with diabetic peripheral angiopathy without gangrene: Secondary | ICD-10-CM

## 2017-10-19 DIAGNOSIS — I872 Venous insufficiency (chronic) (peripheral): Secondary | ICD-10-CM | POA: Diagnosis not present

## 2017-10-19 DIAGNOSIS — L03115 Cellulitis of right lower limb: Secondary | ICD-10-CM

## 2017-10-19 DIAGNOSIS — S80811A Abrasion, right lower leg, initial encounter: Secondary | ICD-10-CM | POA: Diagnosis not present

## 2017-10-19 MED ORDER — CEPHALEXIN 500 MG PO CAPS: 1000.0000 mg | ORAL_CAPSULE | Freq: Two times a day (BID) | ORAL | 0 refills | Status: DC

## 2017-10-19 NOTE — Patient Instructions (Signed)
Take all of your antibiotics  Keep wound open to the air if at all possible  When working, keep wrapped like Rollene Fare the nurse showed you.

## 2017-10-19 NOTE — Progress Notes (Signed)
Dr. Frederico Hamman T. Seville Brick, MD, [facility] [place], [address] Phone: [phone] Fax: [phone]  10/19/2017  Patient: [name], MRN: [id number], DOB: 1927/11/06, 84 y.o.  Primary Physician:  Ria Bush, MD   Chief Complaint  Patient presents with  . injured right leg on a table   Subjective:   [name] is a 84 y.o. very pleasant female patient who presents with the following:  Injury in patient with CAD, DM, AF.  Hit her shin on Monday - using walker table fell and hit her on the leg and now has a lesion on the right leg it is approximately 1 inch across and it did bleed quite a bit initially.  Since then she did develop some redness and is had some redness, warmth, as well as pain in the surrounding tissue.  She has been treating this with an occlusive dressing.  She does have a significant history for prior wound problems and has been extensively to the wound center at [facility].  Td  Past Medical History, Surgical History, Social History, Family History, Problem List, Medications, and Allergies have been reviewed and updated if relevant.  Patient Active Problem List   Diagnosis Date Noted  . Systolic murmur 81/27/5170  . De Quervain's tenosynovitis, left 01/09/2017  . Encounter for chronic pain management 02/09/2016  . Paroxysmal atrial fibrillation (HCC)   . Varicose veins of left lower extremity with both ulcer of calf and inflammation ([place]) 05/22/2015  . Varicose veins of right lower extremity with both ulcer of calf and inflammation ([place]) 01/23/2015  . Diabetes mellitus type 2 with peripheral artery disease ([place])   . Chronic venous insufficiency   . Bleeding from varicose veins of lower extremity, left 02/13/2014  . Diabetic polyneuropathy ([place]) 11/19/2013  . Type 2 diabetes mellitus with neurological manifestations, controlled ([place]) 11/19/2013  . Right shoulder pain  08/19/2013  . Cerumen impaction 04/16/2013  . Abdominal pain 05/14/2012  . Peptic ulcer disease   . Falls 12/13/2011  . Chronic lower back pain 09/14/2011  . Microcytic anemia 09/06/2010  . Hyperlipidemia 09/06/2010  . GAD (generalized anxiety disorder) 03/15/2010  . Obesity, Class I, BMI 30-34.9 03/15/2010  . Hypertension 03/15/2010  . (HFpEF) heart failure with preserved ejection fraction ([place]) 03/15/2010  . Coronary artery disease     Past Medical History:  Diagnosis Date  . Acute deep vein thrombosis (DVT) of distal vein of right lower extremity ([place]) 12/2013   Acute thrombus of R proximal gastrocnemius vein. Symptomatic provoked distal DVT  . Anxiety   . Cellulitis of leg, left 01/16/2014  . Chronic back pain 03/2012   lumbar DDD with herniation - s/p L S1 transforaminal ESI (10/2012 Dr. Sharlet Salina)  . Chronic venous insufficiency   . Coronary artery disease    CABG 1998  . Cystocele   . Depression   . Diabetes mellitus    type 2, followed by endo.  . Diastolic dysfunction   . Diverticulosis   . Hemorrhoids   . History of heart attack   . Hx of echocardiogram    a. Echo 10/13: mild LVH, EF 55-60%, Gr 1 diast dysfn, PASP 32  . Hyperlipidemia   . Hypertension   . Irritable bowel syndrome   . Microcytic anemia   . PAD (peripheral artery disease) ([place]) 2013   ABI: R 0.91, L 0.83  . PUD (peptic ulcer disease)   . Varicose veins   .  Venous ulcer of leg ([place]) 12/12/2013   Required prolonged treatment by wound center  . Viral gastroenteritis due to [place] virus 05/13/2015   hospitalization    Past Surgical History:  Procedure Laterality Date  . ABDOMINAL HYSTERECTOMY  1975   TAH,BSO  . ABI  2013   R 0.91, L 0.83  . APPENDECTOMY     per pt report  . CARDIOVASCULAR STRESS TEST  2015   low risk myoview ([place])  . CATARACT SURGERY    . CHOLECYSTECTOMY    . COLONOSCOPY  02/2010   extensive diverticulosis throughout, small internal hemorrhoids, rec rpt 5 yrs (Dr.  Allyn Kenner)  . CORONARY ARTERY BYPASS GRAFT  1998  . dexa scan  09/2011   femur -0.8, forearm 0.6 => normal  . ENDOVENOUS ABLATION SAPHENOUS VEIN W/ LASER Left 03/2014   Kellie Simmering  . ESI  2014   L S1 transforaminal ESI x2 ([place]) without improvement  . HAND SURGERY    . lexiscan myoview  03/2011   Small basal inferolateral and anterolateral reversible perfusion defect suggests ischemia.  EF was normal.   old infarct, no new ischemia  . toe surgery    . TYMPANOPLASTY  1960s   R side  . Vault prolapse A & P repair  2009   Dr Rhodia Albright [facility]    Social History   Socioeconomic History  . Marital status: Single    Spouse name: Not on file  . Number of children: Not on file  . Years of education: Not on file  . Highest education level: Not on file  Occupational History  . Occupation: Waitress   Social Needs  . Financial resource strain: Not on file  . Food insecurity:    Worry: Not on file    Inability: Not on file  . Transportation needs:    Medical: Not on file    Non-medical: Not on file  Tobacco Use  . Smoking status: Former Smoker    Last attempt to quit: 03/05/1979    Years since quitting: 38.6  . Smokeless tobacco: Never Used  Substance and Sexual Activity  . Alcohol use: No    Alcohol/week: 0.0 oz  . Drug use: No  . Sexual activity: Never    Comment: 1st intercourse 84yo.--- 2 sexual partners  Lifestyle  . Physical activity:    Days per week: Not on file    Minutes per session: Not on file  . Stress: Not on file  Relationships  . Social connections:    Talks on phone: Not on file    Gets together: Not on file    Attends religious service: Not on file    Active member of club or organization: Not on file    Attends meetings of clubs or organizations: Not on file    Relationship status: Not on file  . Intimate partner violence:    Fear of current or ex partner: Not on file    Emotionally abused: Not on file    Physically abused: Not on file    Forced  sexual activity: Not on file  Other Topics Concern  . 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   Independent for all ADLs, lives with a boyfriend.      Family History  Problem Relation Age of Onset  . Hypertension Mother   . Stroke Mother   . Diabetes Father   . Diabetes Sister   . Diabetes Brother   .  Heart disease Son   . Colon cancer Neg Hx     Allergies  Allergen Reactions  . Metformin And Related Diarrhea    Medication list reviewed and updated in full in [facility].   GEN: No acute illnesses, no fevers, chills. GI: No n/v/d, eating normally Pulm: No SOB Interactive and getting along well at home.  Otherwise, ROS is as per the HPI.  Objective:   BP 108/68   Pulse 69   Temp 98.6 F (37 C) (Oral)   Ht 4\' 9"  (1.448 m)   Wt 147 lb 12 oz (67 kg)   SpO2 95%   BMI 31.97 kg/m   GEN: WDWN, NAD, Non-toxic, A & O x 3 HEENT: Atraumatic, Normocephalic. Neck supple. No masses, No LAD. Ears and Nose: No external deformity. CV: RRR, No M/G/R. No JVD. No thrill. No extra heart sounds. PULM: CTA B, no wheezes, crackles, rhonchi. No retractions. No resp. distress. No accessory muscle use. EXTR: No c/c/e NEURO Normal gait.  PSYCH: Normally interactive. Conversant. Not depressed or anxious appearing.  Calm demeanor.   Anterior right leg with an area approximately 1 inch across with a flap of skin in good tissue and granulation tissue underneath this.  Surrounding this there is some redness and warmth which was marked with a permanent marker.  Laboratory and Imaging Data:  Assessment and Plan:   Cellulitis of right leg - Plan: Td : Tetanus/diphtheria >7yo Preservative  free  Abrasion of right lower extremity, initial encounter - Plan: Td : Tetanus/diphtheria >7yo Preservative  free  Laceration of right lower leg, initial encounter - Plan: Td : Tetanus/diphtheria >7yo Preservative  free  Diabetes mellitus type 2 with  peripheral artery disease ([place])  Heart failure with preserved ejection fraction, unspecified HF chronicity ([place])  Chronic venous insufficiency  I placed the patient on antibiotics and updated her tetanus shot.  She is a high risk patient and she will be rechecked on Monday.  Follow-up: Return for recheck with Dr. Darnell Level on Monday.  Meds ordered this encounter  Medications  . cephALEXin (KEFLEX) 500 MG capsule    Sig: Take 2 capsules (1,000 mg total) by mouth 2 (two) times daily.    Dispense:  40 capsule    Refill:  0   Orders Placed This Encounter  Procedures  . Td : Tetanus/diphtheria >7yo Preservative  free    Signed,  Abishai Viegas T. Brysen Shankman, MD   Allergies as of 10/19/2017      Reactions   Metformin And Related Diarrhea      Medication List        Accurate as of 10/19/17  6:43 PM. Always use your most recent med list.          acetaminophen 500 MG tablet Commonly known as:  TYLENOL Take 500 mg by mouth every 6 (six) hours as needed for mild pain.   ALIGN 4 MG Caps Take 1 capsule (4 mg total) by mouth daily.   ALPRAZolam 1 MG tablet Commonly known as:  XANAX Take 0.5 mg by mouth 4 (four) times daily as needed for anxiety.   aspirin EC 81 MG tablet Take 81 mg by mouth daily.   cephALEXin 500 MG capsule Commonly known as:  KEFLEX Take 2 capsules (1,000 mg total) by mouth 2 (two) times daily.   DEXILANT 30 MG capsule Generic drug:  Dexlansoprazole TAKE 1 CAPSULE (30 MG TOTAL) BY MOUTH DAILY.   diclofenac sodium 1 % Gel Commonly known as:  VOLTAREN  Apply 1 application topically 3 (three) times daily.   furosemide 20 MG tablet Commonly known as:  LASIX TAKE 0.5 TABLETS (10 MG TOTAL) BY MOUTH DAILY.   glucosamine-chondroitin 500-400 MG tablet Take 1 tablet by mouth daily.   glucose blood test strip Commonly known as:  ONE TOUCH ULTRA TEST USE AS INSTRUCTED TO CHECK BLOOD SUGAR ONCE A DAY Dx code: E11.9   HYDROcodone-acetaminophen 5-325 MG tablet Commonly  known as:  NORCO/VICODIN Take 0.5-1 tablets by mouth every 6 (six) hours as needed for moderate pain.   loperamide 2 MG capsule Commonly known as:  IMODIUM Take 2 mg by mouth 4 (four) times daily as needed for diarrhea or loose stools.   loratadine 10 MG tablet Commonly known as:  CLARITIN Take 10 mg by mouth daily as needed for allergies.   metoprolol tartrate 25 MG tablet Commonly known as:  LOPRESSOR TAKE ONE AND HALF TABLETS (37.5 MG TOTAL) BY MOUTH 2 (TWO) TIMES DAILY.   multivitamin tablet Take 1 tablet by mouth daily. Patient reports takes a new vitamin to replace her centrum as recommended by her eye doctor.   nitroGLYCERIN 0.4 MG SL tablet Commonly known as:  NITROSTAT Place 1 tablet (0.4 mg total) under the tongue every 5 (five) minutes as needed for chest pain.   nystatin-triamcinolone ointment Commonly known as:  MYCOLOG APPLY 1 APPLICATION TOPICALLY 2 (TWO) TIMES DAILY. AS NEEDED FOR ITCHING   pioglitazone 30 MG tablet Commonly known as:  ACTOS Take 1 tablet (30 mg total) by mouth daily.   pravastatin 20 MG tablet Commonly known as:  PRAVACHOL TAKE 1 TABLET BY MOUTH EVERY DAY   ramipril 5 MG capsule Commonly known as:  ALTACE TAKE ONE CAPSULE BY MOUTH EVERYDAY FOR BLOOD PRESSURE   ranitidine 150 MG tablet Commonly known as:  ZANTAC Take 1 tablet (150 mg total) by mouth at bedtime.   traZODone 150 MG tablet Commonly known as:  DESYREL Take 150 mg by mouth at bedtime.   triamcinolone cream 0.1 % Commonly known as:  KENALOG APPLY 1 APPLICATION TOPICALLY 2 (TWO) TIMES DAILY.   vitamin B-12 1000 MCG tablet Commonly known as:  CYANOCOBALAMIN Take 1,000 mcg by mouth daily.   vitamin E 400 UNIT capsule Take 400 Units by mouth daily.

## 2017-10-20 ENCOUNTER — Ambulatory Visit: Payer: Self-pay | Admitting: *Deleted

## 2017-10-20 NOTE — Telephone Encounter (Signed)
[facility] called stating that the pt was having diarrhea with taking Keflex and wanted to know if mediation needed to be changed.   Called pt who states that she took 2 capsules of the Keflex as prescribed around 6pm on yesterday and had one episode of diarrhea around 1am. Pt reports that she only took one 500mg  capsule instead of two this morning because she was afraid that she was going to have another episode of diarrhea. Pt states she has not had another episode of diarrhea at this time. Advised pt that with taking antibiotics one of the side effects can be diarrhea. Pt also advised to take the medication as prescribed to ensure it works effectively. Pt states she will try to take 2 tabs this afternoon to see how she is able to tolerate the medication. Pt advised to return call to the office if she begins to experience more episodes of diarrhea with taking Keflex. Pt verbalized understanding.   Reason for Disposition . [1] MILD diarrhea AND [2] taking antibiotics  Answer Assessment - Initial Assessment Questions 1. DIARRHEA SEVERITY: "How bad is the diarrhea?" "How many extra stools have you had in the past 24 hours than normal?"    - NO DIARRHEA (SCALE 0)   - MILD (SCALE 1-3): Few loose or mushy BMs; increase of 1-3 stools over normal daily number of stools; mild increase in ostomy output.   -  MODERATE (SCALE 4-7): Increase of 4-6 stools daily over normal; moderate increase in ostomy output. * SEVERE (SCALE 8-10; OR 'WORST POSSIBLE'): Increase of 7 or more stools daily over normal; moderate increase in ostomy output; incontinence.     mild 2. ONSET: "When did the diarrhea begin?"      Early this morning 3. BM CONSISTENCY: "How loose or watery is the diarrhea?"      Not assessed 4. VOMITING: "Are you also vomiting?" If so, ask: "How many times in the past 24 hours?"      no 5. ABDOMINAL PAIN: "Are you having any abdominal pain?" If yes: "What does it feel like?" (e.g., crampy, dull,  intermittent, constant)      No complaints of abdominal pain 6. ABDOMINAL PAIN SEVERITY: If present, ask: "How bad is the pain?"  (e.g., Scale 1-10; mild, moderate, or severe)   - MILD (1-3): doesn't interfere with normal activities, abdomen soft and not tender to touch    - MODERATE (4-7): interferes with normal activities or awakens from sleep, tender to touch    - SEVERE (8-10): excruciating pain, doubled over, unable to do any normal activities       n/a 7. ORAL INTAKE: If vomiting, "Have you been able to drink liquids?" "How much fluids have you had in the past 24 hours?"     Not assessed 8. HYDRATION: "Any signs of dehydration?" (e.g., dry mouth [not just dry lips], too weak to stand, dizziness, new weight loss) "When did you last urinate?"     no 9. EXPOSURE: "Have you traveled to a foreign country recently?" "Have you been exposed to anyone with diarrhea?" "Could you have eaten any food that was spoiled?"     Not assessed 10. ANTIBIOTIC USE: "Are you taking antibiotics now or have you taken antibiotics in the past 2 months?"      Yes started Keflex on yesterday 11. OTHER SYMPTOMS: "Do you have any other symptoms?" (e.g., fever, blood in stool)       no  Protocols used: DIARRHEA-A-AH

## 2017-10-23 ENCOUNTER — Ambulatory Visit (INDEPENDENT_AMBULATORY_CARE_PROVIDER_SITE_OTHER): Payer: Medicare Other | Admitting: Family Medicine

## 2017-10-23 ENCOUNTER — Encounter: Payer: Self-pay | Admitting: Family Medicine

## 2017-10-23 VITALS — BP 112/62 | HR 76 | Temp 98.7°F | Ht <= 58 in | Wt 149.5 lb

## 2017-10-23 DIAGNOSIS — M79604 Pain in right leg: Secondary | ICD-10-CM | POA: Diagnosis not present

## 2017-10-23 DIAGNOSIS — S80811D Abrasion, right lower leg, subsequent encounter: Secondary | ICD-10-CM | POA: Diagnosis not present

## 2017-10-23 DIAGNOSIS — E1151 Type 2 diabetes mellitus with diabetic peripheral angiopathy without gangrene: Secondary | ICD-10-CM | POA: Diagnosis not present

## 2017-10-23 NOTE — Patient Instructions (Signed)
REFERRALS TO SPECIALISTS, SPECIAL TESTS (MRI, CT, ULTRASOUNDS)  [facility] or  [facility] will help you. ASK CHECK-IN FOR HELP.  Specialist appointment times vary a great deal, based on their schedule / openings. -- Some specialists have very long wait times. (Example. Dermatology)    

## 2017-10-23 NOTE — Progress Notes (Signed)
Dr. Frederico Hamman T. Ronnisha Felber, MD, [facility] [place], [address] Phone: [phone] Fax: [phone]  10/23/2017  Patient: [name], MRN: [id number], DOB: [birth year], 84 y.o.  Primary Physician:  Ria Bush, MD   Chief Complaint  Patient presents with  . Follow-up    on right leg   Subjective:   [name] is a 84 y.o. very pleasant female patient who presents with the following:  R leg wound: Patient returns in follow-up of her right leg wound that she obtained after scraping it in her garage on a piece of furniture.  Last week I was concerned that this was not healing and was surrounded by some redness and start her on some Keflex.  This has persisted.  She also has a couple of other smaller areas that are fairly near to this that are tender to palpation.  She is having some pain in the posterior of her right lower extremity also.  She has not been taking care of the wound and the way that we have told her.  She has not been febrile.  Past Medical History, Surgical History, Social History, Family History, Problem List, Medications, and Allergies have been reviewed and updated if relevant.  Patient Active Problem List   Diagnosis Date Noted  . Systolic murmur 47/12/6281  . De Quervain's tenosynovitis, left 01/09/2017  . Encounter for chronic pain management 02/09/2016  . Paroxysmal atrial fibrillation (HCC)   . Varicose veins of left lower extremity with both ulcer of calf and inflammation ([place]) 05/22/2015  . Varicose veins of right lower extremity with both ulcer of calf and inflammation ([place]) 01/23/2015  . Diabetes mellitus type 2 with peripheral artery disease ([place])   . Chronic venous insufficiency   . Bleeding from varicose veins of lower extremity, left 02/13/2014  . Diabetic polyneuropathy ([place]) 11/19/2013  . Type 2 diabetes mellitus with neurological manifestations, controlled ([place])  11/19/2013  . Right shoulder pain 08/19/2013  . Cerumen impaction 04/16/2013  . Abdominal pain 05/14/2012  . Peptic ulcer disease   . Falls 12/13/2011  . Chronic lower back pain 09/14/2011  . Microcytic anemia 09/06/2010  . Hyperlipidemia 09/06/2010  . GAD (generalized anxiety disorder) 03/15/2010  . Obesity, Class I, BMI 30-34.9 03/15/2010  . Hypertension 03/15/2010  . (HFpEF) heart failure with preserved ejection fraction ([place]) 03/15/2010  . Coronary artery disease     Past Medical History:  Diagnosis Date  . Acute deep vein thrombosis (DVT) of distal vein of right lower extremity ([place]) 12/2013   Acute thrombus of R proximal gastrocnemius vein. Symptomatic provoked distal DVT  . Anxiety   . Cellulitis of leg, left 01/16/2014  . Chronic back pain 03/2012   lumbar DDD with herniation - s/p L S1 transforaminal ESI (10/2012 Dr. Sharlet Salina)  . Chronic venous insufficiency   . Coronary artery disease    CABG 1998  . Cystocele   . Depression   . Diabetes mellitus    type 2, followed by endo.  . Diastolic dysfunction   . Diverticulosis   . Hemorrhoids   . History of heart attack   . Hx of echocardiogram    a. Echo 10/13: mild LVH, EF 55-60%, Gr 1 diast dysfn, PASP 32  . Hyperlipidemia   . Hypertension   . Irritable bowel syndrome   . Microcytic anemia   . PAD (peripheral artery disease) ([place]) 2013   ABI: R 0.91, L 0.83  .  PUD (peptic ulcer disease)   . Varicose veins   . Venous ulcer of leg ([place]) 12/12/2013   Required prolonged treatment by wound center  . Viral gastroenteritis due to [place] virus 05/13/2015   hospitalization    Past Surgical History:  Procedure Laterality Date  . ABDOMINAL HYSTERECTOMY  1975   TAH,BSO  . ABI  2013   R 0.91, L 0.83  . APPENDECTOMY     per pt report  . CARDIOVASCULAR STRESS TEST  2015   low risk myoview ([place])  . CATARACT SURGERY    . CHOLECYSTECTOMY    . COLONOSCOPY  02/2010   extensive diverticulosis throughout, small internal  hemorrhoids, rec rpt 5 yrs (Dr. Allyn Kenner)  . CORONARY ARTERY BYPASS GRAFT  1998  . dexa scan  09/2011   femur -0.8, forearm 0.6 => normal  . ENDOVENOUS ABLATION SAPHENOUS VEIN W/ LASER Left 03/2014   Kellie Simmering  . ESI  2014   L S1 transforaminal ESI x2 ([place]) without improvement  . HAND SURGERY    . lexiscan myoview  03/2011   Small basal inferolateral and anterolateral reversible perfusion defect suggests ischemia.  EF was normal.   old infarct, no new ischemia  . toe surgery    . TYMPANOPLASTY  1960s   R side  . Vault prolapse A & P repair  2009   Dr Rhodia Albright [facility]    Social History   Socioeconomic History  . Marital status: Single    Spouse name: Not on file  . Number of children: Not on file  . Years of education: Not on file  . Highest education level: Not on file  Occupational History  . Occupation: Waitress   Social Needs  . Financial resource strain: Not on file  . Food insecurity:    Worry: Not on file    Inability: Not on file  . Transportation needs:    Medical: Not on file    Non-medical: Not on file  Tobacco Use  . Smoking status: Former Smoker    Last attempt to quit: 03/05/1979    Years since quitting: 38.6  . Smokeless tobacco: Never Used  Substance and Sexual Activity  . Alcohol use: No    Alcohol/week: 0.0 oz  . Drug use: No  . Sexual activity: Never    Comment: 1st intercourse 84yo.--- 2 sexual partners  Lifestyle  . Physical activity:    Days per week: Not on file    Minutes per session: Not on file  . Stress: Not on file  Relationships  . Social connections:    Talks on phone: Not on file    Gets together: Not on file    Attends religious service: Not on file    Active member of club or organization: Not on file    Attends meetings of clubs or organizations: Not on file    Relationship status: Not on file  . Intimate partner violence:    Fear of current or ex partner: Not on file    Emotionally abused: Not on file    Physically  abused: Not on file    Forced sexual activity: Not on file  Other Topics Concern  . 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   Independent for all ADLs, lives with a boyfriend.      Family History  Problem Relation Age of Onset  . Hypertension Mother   . Stroke Mother   .  Diabetes Father   . Diabetes Sister   . Diabetes Brother   . Heart disease Son   . Colon cancer Neg Hx     Allergies  Allergen Reactions  . Metformin And Related Diarrhea    Medication list reviewed and updated in full in [facility].   GEN: No acute illnesses, no fevers, chills. GI: No n/v/d, eating normally Pulm: No SOB Interactive and getting along well at home.  Otherwise, ROS is as per the HPI.  Objective:   BP 112/62   Pulse 76   Temp 98.7 F (37.1 C) (Oral)   Ht 4\' 9"  (1.448 m)   Wt 149 lb 8 oz (67.8 kg)   BMI 32.35 kg/m   GEN: WDWN, NAD, Non-toxic, A & O x 3 HEENT: Atraumatic, Normocephalic. Neck supple. No masses, No LAD. Ears and Nose: No external deformity. CV: RRR, No M/G/R. No JVD. No thrill. No extra heart sounds. PULM: CTA B, no wheezes, crackles, rhonchi. No retractions. No resp. distress. No accessory muscle use. EXTR: No c/c/e NEURO Normal gait.  PSYCH: Normally interactive. Conversant. Not depressed or anxious appearing.  Calm demeanor.   Right lower extremity, the patient has extensive vascular chain and and venous disease, and there is a central area of wound that is about 1 x 1 inch, and there is also some redness adjacent to this.  With compression of her calf she also does have some pain.  There is a minuscule area caudal to this that is darkened in appearance and may represent early skin breakdown.  Laboratory and Imaging Data:  Assessment and Plan:   Abrasion of right lower extremity, subsequent encounter - Plan: AMB referral to wound care center  Diabetes mellitus type 2 with peripheral artery disease ([place]) - Plan:  AMB referral to wound care center  Right leg pain - Plan: AMB referral to wound care center, US Venous Img Lower Unilateral Right  Challenging case from a wound healing standpoint.  I had my partner Dr. Darene Lamer also evaluate the wound.  I am going to check for a DVT with lower extremity ultrasound, and also refer her to the wound center.  Concern for vascular dilation decreasing risk for wound healing.  She refused an Haematologist.  I placed a Telfa over the wound and then wrapped with Coban and.  Subsequently I wrapped the lower extremity in Ace bandage, combination 2 inch and 4 inch in a similar fashion that one would with a short leg cast to give the wound and lower extremity some compression in a light fashion.  When she is able to, some light compression stockings might be appropriate also.  Given follow-up in availability of the wound care center, I am good to have the patient follow-up with her primary care doctor for additional care and assistance.  Follow-up: with PCP  Orders Placed This Encounter  Procedures  . US Venous Img Lower Unilateral Right  . AMB referral to wound care center    Signed,  Bernis Schreur T. Autumm Hattery, MD   Allergies as of 10/23/2017      Reactions   Metformin And Related Diarrhea      Medication List        Accurate as of 10/23/17  6:07 PM. Always use your most recent med list.          acetaminophen 500 MG tablet Commonly known as:  TYLENOL Take 500 mg by mouth every 6 (six) hours as needed for mild pain.  ALIGN 4 MG Caps Take 1 capsule (4 mg total) by mouth daily.   ALPRAZolam 1 MG tablet Commonly known as:  XANAX Take 0.5 mg by mouth 4 (four) times daily as needed for anxiety.   aspirin EC 81 MG tablet Take 81 mg by mouth daily.   cephALEXin 500 MG capsule Commonly known as:  KEFLEX Take 2 capsules (1,000 mg total) by mouth 2 (two) times daily.   DEXILANT 30 MG capsule Generic drug:  Dexlansoprazole TAKE 1 CAPSULE (30 MG TOTAL) BY MOUTH  DAILY.   diclofenac sodium 1 % Gel Commonly known as:  VOLTAREN Apply 1 application topically 3 (three) times daily.   furosemide 20 MG tablet Commonly known as:  LASIX TAKE 0.5 TABLETS (10 MG TOTAL) BY MOUTH DAILY.   glucosamine-chondroitin 500-400 MG tablet Take 1 tablet by mouth daily.   glucose blood test strip Commonly known as:  ONE TOUCH ULTRA TEST USE AS INSTRUCTED TO CHECK BLOOD SUGAR ONCE A DAY Dx code: E11.9   HYDROcodone-acetaminophen 5-325 MG tablet Commonly known as:  NORCO/VICODIN Take 0.5-1 tablets by mouth every 6 (six) hours as needed for moderate pain.   loperamide 2 MG capsule Commonly known as:  IMODIUM Take 2 mg by mouth 4 (four) times daily as needed for diarrhea or loose stools.   loratadine 10 MG tablet Commonly known as:  CLARITIN Take 10 mg by mouth daily as needed for allergies.   metoprolol tartrate 25 MG tablet Commonly known as:  LOPRESSOR TAKE ONE AND HALF TABLETS (37.5 MG TOTAL) BY MOUTH 2 (TWO) TIMES DAILY.   multivitamin tablet Take 1 tablet by mouth daily. Patient reports takes a new vitamin to replace her centrum as recommended by her eye doctor.   nitroGLYCERIN 0.4 MG SL tablet Commonly known as:  NITROSTAT Place 1 tablet (0.4 mg total) under the tongue every 5 (five) minutes as needed for chest pain.   nystatin-triamcinolone ointment Commonly known as:  MYCOLOG APPLY 1 APPLICATION TOPICALLY 2 (TWO) TIMES DAILY. AS NEEDED FOR ITCHING   pioglitazone 30 MG tablet Commonly known as:  ACTOS Take 1 tablet (30 mg total) by mouth daily.   pravastatin 20 MG tablet Commonly known as:  PRAVACHOL TAKE 1 TABLET BY MOUTH EVERY DAY   ramipril 5 MG capsule Commonly known as:  ALTACE TAKE ONE CAPSULE BY MOUTH EVERYDAY FOR BLOOD PRESSURE   ranitidine 150 MG tablet Commonly known as:  ZANTAC Take 1 tablet (150 mg total) by mouth at bedtime.   traZODone 150 MG tablet Commonly known as:  DESYREL Take 150 mg by mouth at bedtime.     triamcinolone cream 0.1 % Commonly known as:  KENALOG APPLY 1 APPLICATION TOPICALLY 2 (TWO) TIMES DAILY.   vitamin B-12 1000 MCG tablet Commonly known as:  CYANOCOBALAMIN Take 1,000 mcg by mouth daily.   vitamin E 400 UNIT capsule Take 400 Units by mouth daily.

## 2017-10-24 ENCOUNTER — Telehealth: Payer: Self-pay

## 2017-10-24 ENCOUNTER — Ambulatory Visit
Admission: RE | Admit: 2017-10-24 | Discharge: 2017-10-24 | Disposition: A | Discharge status: Home / Self Care | Payer: Medicare Other | Source: Ambulatory Visit | Attending: Family Medicine | Admitting: Family Medicine

## 2017-10-24 DIAGNOSIS — M79604 Pain in right leg: Secondary | ICD-10-CM | POA: Diagnosis not present

## 2017-10-24 DIAGNOSIS — R600 Localized edema: Secondary | ICD-10-CM | POA: Diagnosis not present

## 2017-10-24 NOTE — Telephone Encounter (Addendum)
Stephanie [place] [address] called report  For US venous Imaging Lower rt leg. Pt waiting. Dr Diona Browner said to tell pt no DVT, can go home and should receive f/u call when Dr Lorelei Pont returns on 10/25/17. Colletta [place] voiced understanding and will let pt know.

## 2017-10-25 NOTE — Telephone Encounter (Signed)
Patient notified as instructed by telephone and verbalized understanding. 

## 2017-10-25 NOTE — Telephone Encounter (Signed)
Please call  No DVT. Good news. Keep with some light compression like we discussed in the office.   Be sure to keep 7/29 appointment with Dr. Darnell Level and then with wound center

## 2017-10-26 ENCOUNTER — Other Ambulatory Visit: Payer: Self-pay | Admitting: Family Medicine

## 2017-10-30 ENCOUNTER — Ambulatory Visit (INDEPENDENT_AMBULATORY_CARE_PROVIDER_SITE_OTHER): Payer: Medicare Other | Admitting: Family Medicine

## 2017-10-30 ENCOUNTER — Encounter: Payer: Self-pay | Admitting: Family Medicine

## 2017-10-30 VITALS — BP 124/68 | HR 69 | Temp 98.0°F | Ht <= 58 in | Wt 149.0 lb

## 2017-10-30 DIAGNOSIS — I872 Venous insufficiency (chronic) (peripheral): Secondary | ICD-10-CM | POA: Diagnosis not present

## 2017-10-30 DIAGNOSIS — S81801D Unspecified open wound, right lower leg, subsequent encounter: Secondary | ICD-10-CM | POA: Diagnosis not present

## 2017-10-30 NOTE — Assessment & Plan Note (Signed)
DOI: 10/23/2017 Suffered after table hit shin [place] negative for DVT. Completed keflex course for presumed cellulitis.  Overall healing well. Reassured patient. Reviewed home wound care instructions. Keep wound clinic appt 8/7 in case not continuing to improve each day, but did discuss may cancel appt of continues healing as anticipated.

## 2017-10-30 NOTE — Patient Instructions (Signed)
I think shin wound is healing well.  No more peroxide. Start using neosporin or vaseline daily during dressing changes.  Continue compression wrap.  If fully healing, ok to cancel wound clinic appointment. We will keep it in the books for now.

## 2017-10-30 NOTE — Progress Notes (Signed)
BP 124/68 (BP Location: Left Arm, Patient Position: Sitting, Cuff Size: Normal)   Pulse 69   Temp 98 F (36.7 C) (Oral)   Ht 4\' 9"  (1.448 m)   Wt 149 lb (67.6 kg)   SpO2 96%   BMI 32.24 kg/m    CC: wound check Subjective:    [name], female    DOB: Dec 02, 1927, 84 y.o.   MRN: [id number]  HPI: [name] is a 84 y.o. female presenting on 10/30/2017 for Wound Check (Here for 1 wk wound check on right lower leg.  Seen initially by Dr. Lorelei Pont on 10/23/17.)   See prior note for details.  Saw Dr Lorelei Pont last week, notes reviewed.  Suffered RLE abrasion after scraping leg on furniture in the garage.  Concern for developing cellulitis on follow up - treated with keflex antibiotic. She refused unna boot - trouble tolerating the compression.  LE [place] was negative for DVT 10/23/2017.  Referred to wound clinic - appt scheduled 11/08/2017.   Finished abx. She has been using peroxide daily. Redness has slowly improved.   Tetanus: Td updated 10/2017.   Relevant past medical, surgical, family and social history reviewed and updated as indicated. Interim medical history since our last visit reviewed. Allergies and medications reviewed and updated. Outpatient Medications Prior to Visit  Medication Sig Dispense Refill  . acetaminophen (TYLENOL) 500 MG tablet Take 500 mg by mouth every 6 (six) hours as needed for mild pain.    Marland Kitchen ALPRAZolam (XANAX) 1 MG tablet Take 0.5 mg by mouth 4 (four) times daily as needed for anxiety.     Marland Kitchen aspirin EC 81 MG tablet Take 81 mg by mouth daily.    Marland Kitchen DEXILANT 30 MG capsule TAKE 1 CAPSULE (30 MG TOTAL) BY MOUTH DAILY. 30 capsule 11  . diclofenac sodium (VOLTAREN) 1 % GEL Apply 1 application topically 3 (three) times daily. 1 Tube 1  . furosemide (LASIX) 20 MG tablet TAKE 0.5 TABLETS (10 MG TOTAL) BY MOUTH DAILY. 45 tablet 3  . glucosamine-chondroitin 500-400 MG tablet Take 1 tablet by mouth daily.     Marland Kitchen glucose blood (ONE TOUCH ULTRA TEST)  test strip USE AS INSTRUCTED TO CHECK BLOOD SUGAR ONCE A DAY Dx code: E11.9 50 each 5  . HYDROcodone-acetaminophen (NORCO/VICODIN) 5-325 MG tablet Take 0.5-1 tablets by mouth every 6 (six) hours as needed for moderate pain. 60 tablet 0  . loperamide (IMODIUM) 2 MG capsule Take 2 mg by mouth 4 (four) times daily as needed for diarrhea or loose stools.    Marland Kitchen loratadine (CLARITIN) 10 MG tablet Take 10 mg by mouth daily as needed for allergies.    . metoprolol tartrate (LOPRESSOR) 25 MG tablet TAKE ONE AND HALF TABLETS (37.5 MG TOTAL) BY MOUTH 2 (TWO) TIMES DAILY. 270 tablet 0  . Multiple Vitamin (MULTIVITAMIN) tablet Take 1 tablet by mouth daily. Patient reports takes a new vitamin to replace her centrum as recommended by her eye doctor.    . nitroGLYCERIN (NITROSTAT) 0.4 MG SL tablet Place 1 tablet (0.4 mg total) under the tongue every 5 (five) minutes as needed for chest pain. 25 tablet 3  . nystatin-triamcinolone ointment (MYCOLOG) APPLY 1 APPLICATION TOPICALLY 2 (TWO) TIMES DAILY. AS NEEDED FOR ITCHING 60 g 1  . pioglitazone (ACTOS) 30 MG tablet Take 1 tablet (30 mg total) by mouth daily. 90 tablet 3  . pravastatin (PRAVACHOL) 20 MG tablet TAKE 1 TABLET BY MOUTH EVERY DAY 30 tablet  3  . Probiotic Product (ALIGN) 4 MG CAPS Take 1 capsule (4 mg total) by mouth daily.    . ramipril (ALTACE) 5 MG capsule TAKE ONE CAPSULE BY MOUTH EVERYDAY FOR BLOOD PRESSURE 90 capsule 1  . ranitidine (ZANTAC) 150 MG tablet Take 1 tablet (150 mg total) by mouth at bedtime. 30 tablet 3  . traZODone (DESYREL) 150 MG tablet Take 150 mg by mouth at bedtime.     . triamcinolone cream (KENALOG) 0.1 % APPLY 1 APPLICATION TOPICALLY 2 (TWO) TIMES DAILY.  1  . vitamin B-12 (CYANOCOBALAMIN) 1000 MCG tablet Take 1,000 mcg by mouth daily.    . vitamin E 400 UNIT capsule Take 400 Units by mouth daily.    . cephALEXin (KEFLEX) 500 MG capsule Take 2 capsules (1,000 mg total) by mouth 2 (two) times daily. 40 capsule 0   No  facility-administered medications prior to visit.      Per HPI unless specifically indicated in ROS section below Review of Systems     Objective:    BP 124/68 (BP Location: Left Arm, Patient Position: Sitting, Cuff Size: Normal)   Pulse 69   Temp 98 F (36.7 C) (Oral)   Ht 4\' 9"  (1.448 m)   Wt 149 lb (67.6 kg)   SpO2 96%   BMI 32.24 kg/m   Wt Readings from Last 3 Encounters:  10/30/17 149 lb (67.6 kg)  10/23/17 149 lb 8 oz (67.8 kg)  10/19/17 147 lb 12 oz (67 kg)    Physical Exam  Constitutional: She appears well-developed and well-nourished. No distress.  Skin: Skin is warm and dry. No erythema.  Wound to R anterior shin without surrounding erythema or induration - improving with time. She is tender to palpation around wound  Nursing note and vitals reviewed.      Assessment & Plan:   Problem List Items Addressed This Visit    Leg wound, right, subsequent encounter - Primary    DOI: 10/23/2017 Suffered after table hit shin [place] negative for DVT. Completed keflex course for presumed cellulitis.  Overall healing well. Reassured patient. Reviewed home wound care instructions. Keep wound clinic appt 8/7 in case not continuing to improve each day, but did discuss may cancel appt of continues healing as anticipated.       Chronic venous insufficiency       No orders of the defined types were placed in this encounter.  No orders of the defined types were placed in this encounter.   Follow up plan: Return if symptoms worsen or fail to improve.  [name] Bush, MD

## 2017-11-08 ENCOUNTER — Encounter: Payer: Medicare Other | Attending: Nurse Practitioner | Admitting: Nurse Practitioner

## 2017-11-08 DIAGNOSIS — Y92009 Unspecified place in unspecified non-institutional (private) residence as the place of occurrence of the external cause: Secondary | ICD-10-CM | POA: Diagnosis not present

## 2017-11-08 DIAGNOSIS — S81801A Unspecified open wound, right lower leg, initial encounter: Secondary | ICD-10-CM | POA: Diagnosis not present

## 2017-11-08 DIAGNOSIS — Z87891 Personal history of nicotine dependence: Secondary | ICD-10-CM | POA: Diagnosis not present

## 2017-11-08 DIAGNOSIS — I2510 Atherosclerotic heart disease of native coronary artery without angina pectoris: Secondary | ICD-10-CM | POA: Diagnosis not present

## 2017-11-08 DIAGNOSIS — W2203XA Walked into furniture, initial encounter: Secondary | ICD-10-CM | POA: Insufficient documentation

## 2017-11-08 DIAGNOSIS — L97819 Non-pressure chronic ulcer of other part of right lower leg with unspecified severity: Secondary | ICD-10-CM | POA: Diagnosis not present

## 2017-11-08 DIAGNOSIS — I252 Old myocardial infarction: Secondary | ICD-10-CM | POA: Diagnosis not present

## 2017-11-08 DIAGNOSIS — I10 Essential (primary) hypertension: Secondary | ICD-10-CM | POA: Diagnosis not present

## 2017-11-13 ENCOUNTER — Ambulatory Visit (INDEPENDENT_AMBULATORY_CARE_PROVIDER_SITE_OTHER): Payer: Medicare Other | Admitting: Podiatry

## 2017-11-13 ENCOUNTER — Encounter: Payer: Self-pay | Admitting: Podiatry

## 2017-11-13 DIAGNOSIS — M79675 Pain in left toe(s): Secondary | ICD-10-CM | POA: Diagnosis not present

## 2017-11-13 DIAGNOSIS — E1149 Type 2 diabetes mellitus with other diabetic neurological complication: Secondary | ICD-10-CM | POA: Diagnosis not present

## 2017-11-13 DIAGNOSIS — Q828 Other specified congenital malformations of skin: Secondary | ICD-10-CM | POA: Diagnosis not present

## 2017-11-13 DIAGNOSIS — B351 Tinea unguium: Secondary | ICD-10-CM

## 2017-11-13 DIAGNOSIS — M79674 Pain in right toe(s): Secondary | ICD-10-CM | POA: Diagnosis not present

## 2017-11-14 NOTE — Progress Notes (Signed)
Subjective: 84 y.o. returns the office today for painful, elongated, thickened toenails which she cannot trim herself as well as for painful callus on the ball of her right foot.  She does keep Vaseline on the callus to help her walk better which helps.  She is also going to the wound care center currently for a wound on the right leg and she is under the active care of the wound care center at this time.  She says the wound is getting better.  Objective: AAO 3, NAD DP/PT pulses palpable 1/4, CRT less than 3 seconds Protective sensation decreased with Simms Weinstein monofilament Nails hypertrophic, dystrophic, elongated, brittle, discolored 10. There is tenderness overlying the nails 1-5 bilaterally. There is no surrounding erythema or drainage along the nail sites. Hyperkeratic lesion to the right foot submetatarsal 2. Upon debridement, no underlying ulceration, drainage, or signs of infection.  Prominent metatarsal head plantar. Hammertoes present.  Varicose veins are present.   Bandage to the right leg which is intact she does not want it removed today. No open lesions or pre-ulcerative lesions are identified.  No pain with calf compression, swelling, warmth, erythema.  Assessment: Patient presents with symptomatic onychomycosis, hyperkeratotic lesion  Plan: -Treatment options including alternatives, risks, complications were discussed -Hyperkeratotic lesion sharply debrided x 1 without complications or bleeding. -Nails sharply debrided 10 without complication/bleeding. -We will defer to the wound care center in regards to the wound in the right leg. -Discussed daily foot inspection. If there are any changes, to call the office immediately.  -Follow-up in 3 months or sooner if any problems are to arise. In the meantime, encouraged to call the office with any questions, concerns, changes symptoms.  [name], DPM

## 2017-11-16 ENCOUNTER — Encounter: Payer: Medicare Other | Admitting: Nurse Practitioner

## 2017-11-16 DIAGNOSIS — I10 Essential (primary) hypertension: Secondary | ICD-10-CM | POA: Diagnosis not present

## 2017-11-16 DIAGNOSIS — I2510 Atherosclerotic heart disease of native coronary artery without angina pectoris: Secondary | ICD-10-CM | POA: Diagnosis not present

## 2017-11-16 DIAGNOSIS — S81801A Unspecified open wound, right lower leg, initial encounter: Secondary | ICD-10-CM | POA: Diagnosis not present

## 2017-11-16 DIAGNOSIS — Z87891 Personal history of nicotine dependence: Secondary | ICD-10-CM | POA: Diagnosis not present

## 2017-11-16 DIAGNOSIS — I252 Old myocardial infarction: Secondary | ICD-10-CM | POA: Diagnosis not present

## 2017-11-18 NOTE — Progress Notes (Signed)
KAILIA, STARRY ([id number]) Visit Report for 11/08/2017 Abuse/Suicide Risk Screen Details Patient Name: [name], [name] Date of Service: 11/08/2017 12:30 PM Medical Record Number: [id number] Patient Account Number: [id number] Date of Birth/Sex: Jun 30, 1927 (84 y.o. F) Treating RN: Roger Shelter Primary Care [name]: [name] Other Clinician: Referring [name]: [name] Treating [name]/Extender: [name] in Treatment: 0 Abuse/Suicide Risk Screen Items Answer ABUSE/SUICIDE RISK SCREEN: Has anyone close to you tried to hurt or harm you recentlyo No Do you feel uncomfortable with anyone in your familyo No Has anyone forced you do things that you didnot want to doo No Do you have any thoughts of harming yourselfo No Patient displays signs or symptoms of abuse and/or neglect. No Electronic Signature(s) Signed: 11/08/2017 1:45:59 PM By: Roger Shelter Entered By: Roger Shelter on 11/08/2017 12:54:54 [name], [name] ([id number]) -------------------------------------------------------------------------------- Activities of Daily Living Details Patient Name: [name], [name] Date of Service: 11/08/2017 12:30 PM Medical Record Number: [id number] Patient Account Number: [id number] Date of Birth/Sex: [birth year] (84 y.o. F) Treating RN: Roger Shelter Primary Care Stela Iwasaki: [name] Other Clinician: Referring Perris Tripathi: [name] Treating Keana Dueitt/Extender: [name] in Treatment: 0 Activities of Daily Living Items Answer Activities of Daily Living (Please select one for each item) Drive Automobile Need Assistance Take Medications Need Assistance Use Telephone Need Assistance Care for Appearance Need Assistance Use Toilet Need Assistance Bath / Shower Need Assistance Dress Self Need Assistance Feed Self Need Assistance Walk Need Assistance Get In / Out Bed Need Assistance Housework Need Assistance Prepare Meals Need  Assistance Handle Money Need Assistance Shop for Self Need Assistance Electronic Signature(s) Signed: 11/08/2017 1:45:59 PM By: Roger Shelter Entered By: Roger Shelter on 11/08/2017 12:55:21 [name], [name] ([id number]) -------------------------------------------------------------------------------- Education Assessment Details Patient Name: Schnoebelen, [name] Date of Service: 11/08/2017 12:30 PM Medical Record Number: [id number] Patient Account Number: [id number] Date of Birth/Sex: 1927/11/09 (84 y.o. F) Treating RN: Roger Shelter Primary Care Kamon Fahr: [name] Other Clinician: Referring Kataryna Mcquilkin: [name] Treating Tekeya Geffert/Extender: [name] in Treatment: 0 Primary Learner Assessed: Patient Learning Preferences/Education Level/Primary Language Learning Preference: Explanation Highest Education Level: High School Preferred Language: English Cognitive Barrier Assessment/Beliefs Language Barrier: No Translator Needed: No Memory Deficit: No Emotional Barrier: No Cultural/Religious Beliefs Affecting Medical Care: No Physical Barrier Assessment Impaired Vision: No Impaired Hearing: No Decreased Hand dexterity: No Knowledge/Comprehension Assessment Knowledge Level: High Comprehension Level: High Ability to understand written High instructions: Ability to understand verbal High instructions: Motivation Assessment Anxiety Level: Calm Cooperation: Cooperative Education Importance: Acknowledges Need Interest in Health Problems: Asks Questions Perception: Coherent Willingness to Engage in Self- High Management Activities: Readiness to Engage in Self- High Management Activities: Electronic Signature(s) Signed: 11/08/2017 1:45:59 PM By: Roger Shelter Entered By: Roger Shelter on 11/08/2017 12:55:44 [name], [name] ([id number]) -------------------------------------------------------------------------------- Fall Risk Assessment  Details Patient Name: [name], [name] Date of Service: 11/08/2017 12:30 PM Medical Record Number: [id number] Patient Account Number: [id number] Date of Birth/Sex: 04-11-1927 (84 y.o. F) Treating RN: Roger Shelter Primary Care Shonte Soderlund: [name] Other Clinician: Referring Jodee Wagenaar: [name] Treating Kylyn Mcdade/Extender: [name] in Treatment: 0 Fall Risk Assessment Items Have you had 2 or more falls in the last 12 monthso 0 No Have you had any fall that resulted in injury in the last 12 monthso 0 No FALL RISK ASSESSMENT: History of falling - immediate or within 3 months 0 No Secondary diagnosis 0 No Ambulatory aid None/bed rest/wheelchair/nurse 0 No Crutches/cane/walker 0 No Furniture 0 No IV Access/Saline Lock 0  No Gait/Training Normal/bed rest/immobile 0 No Weak 0 No Impaired 0 No Mental Status Oriented to own ability 0 No Electronic Signature(s) Signed: 11/08/2017 1:45:59 PM By: Roger Shelter Entered By: Roger Shelter on 11/08/2017 12:56:02 [name], [name] ([id number]) -------------------------------------------------------------------------------- Nutrition Risk Assessment Details Patient Name: [name], [name] Date of Service: 11/08/2017 12:30 PM Medical Record Number: [id number] Patient Account Number: [id number] Date of Birth/Sex: 1927/08/19 (84 y.o. F) Treating RN: Roger Shelter Primary Care Evetta Renner: [name] Other Clinician: Referring Axavier Pressley: [name] Treating Tomio Kirk/Extender: [name] in Treatment: 0 Height (in): 58 Weight (lbs): 147 Body Mass Index (BMI): 30.7 Nutrition Risk Assessment Items NUTRITION RISK SCREEN: I have an illness or condition that made me change the kind and/or amount of 0 No food I eat I eat fewer than two meals per day 0 No I eat few fruits and vegetables, or milk products 0 No I have three or more drinks of beer, liquor or wine almost every day 0 No I have tooth or  mouth problems that make it hard for me to eat 0 No I don't always have enough money to buy the food I need 0 No I eat alone most of the time 0 No I take three or more different prescribed or over-the-counter drugs a day 0 No Without wanting to, I have lost or gained 10 pounds in the last six months 0 No I am not always physically able to shop, cook and/or feed myself 0 No Nutrition Protocols Good Risk Protocol 0 No interventions needed Moderate Risk Protocol Electronic Signature(s) Signed: 11/08/2017 1:45:59 PM By: Roger Shelter Entered By: Roger Shelter on 11/08/2017 12:56:08

## 2017-11-19 NOTE — Progress Notes (Signed)
VIHANA, KYDD ([id number]) Visit Report for 11/08/2017 Allergy List Details Patient Name: [name], [name] Date of Service: 11/08/2017 12:30 PM Medical Record Number: [id number] Patient Account Number: [id number] Date of Birth/Sex: 1927-11-07 (83 y.o. F) Treating RN: Roger Shelter Primary Care Aiya Keach: Ria Bush Other Clinician: Referring Treshawn Allen: Ria Bush Treating Caleb Prigmore/Extender: Lawanda Cousins Weeks in Treatment: 0 Allergies Active Allergies No Known Allergies Allergy Notes Electronic Signature(s) Signed: 11/08/2017 1:45:59 PM By: Roger Shelter Entered By: Roger Shelter on 11/08/2017 12:54:07 [name], [name] ([id number]) -------------------------------------------------------------------------------- Arrival Information Details Patient Name: [name], [name] Date of Service: 11/08/2017 12:30 PM Medical Record Number: [id number] Patient Account Number: [id number] Date of Birth/Sex: 09-15-1927 (84 y.o. F) Treating RN: Roger Shelter Primary Care Aharon Carriere: Ria Bush Other Clinician: Referring Shuntavia Yerby: Ria Bush Treating Tieler Cournoyer/Extender: Cathie Olden in Treatment: 0 Visit Information Patient Arrived: Walker Arrival Time: 12:48 Accompanied By: self Transfer Assistance: None Patient Identification Verified: Yes Secondary Verification Process Completed: Yes History Since Last Visit All ordered tests and consults were completed: No Added or deleted any medications: No Any new allergies or adverse reactions: No Had a fall or experienced change in activities of daily living that may affect risk of falls: No Signs or symptoms of abuse/neglect since last visito No Hospitalized since last visit: No Implantable device outside of the clinic excluding cellular tissue based products placed in the center since last visit: No Electronic Signature(s) Signed: 11/08/2017 1:45:59 PM By: Roger Shelter Entered By: Roger Shelter on  11/08/2017 12:52:29 [name], [name] ([id number]) -------------------------------------------------------------------------------- Clinic Level of Care Assessment Details Patient Name: [name], [name] Date of Service: 11/08/2017 12:30 PM Medical Record Number: [id number] Patient Account Number: [id number] Date of Birth/Sex: 05/16/1927 (84 y.o. F) Treating RN: Cornell Barman Primary Care Estalee Mccandlish: Ria Bush Other Clinician: Referring Ovetta Bazzano: Ria Bush Treating Donnae Michels/Extender: Cathie Olden in Treatment: 0 Clinic Level of Care Assessment Items TOOL 2 Quantity Score []  - Use when only an EandM is performed on the INITIAL visit 0 ASSESSMENTS - Nursing Assessment / Reassessment []  - General Physical Exam (combine w/ comprehensive assessment (listed just below) when 0 performed on new pt. evals) X- 1 25 Comprehensive Assessment (HX, ROS, Risk Assessments, Wounds Hx, etc.) ASSESSMENTS - Wound and Skin Assessment / Reassessment X - Simple Wound Assessment / Reassessment - one wound 1 5 []  - 0 Complex Wound Assessment / Reassessment - multiple wounds []  - 0 Dermatologic / Skin Assessment (not related to wound area) ASSESSMENTS - Ostomy and/or Continence Assessment and Care []  - Incontinence Assessment and Management 0 []  - 0 Ostomy Care Assessment and Management (repouching, etc.) PROCESS - Coordination of Care X - Simple Patient / Family Education for ongoing care 1 15 []  - 0 Complex (extensive) Patient / Family Education for ongoing care []  - 0 Staff obtains Programmer, systems, Records, Test Results / Process Orders []  - 0 Staff telephones HHA, Nursing Homes / Clarify orders / etc []  - 0 Routine Transfer to another Facility (non-emergent condition) []  - 0 Routine Hospital Admission (non-emergent condition) []  - 0 New Admissions / Biomedical engineer / Ordering NPWT, Apligraf, etc. []  - 0 Emergency Hospital Admission (emergent condition) X- 1 10 Simple  Discharge Coordination []  - 0 Complex (extensive) Discharge Coordination PROCESS - Special Needs []  - Pediatric / Minor Patient Management 0 []  - 0 Isolation Patient Management [name], [name] ([id number]) []  - 0 Hearing / Language / Visual special needs []  - 0 Assessment of Community assistance (transportation, D/C planning, etc.) []  - 0 Additional  assistance / Altered mentation []  - 0 Support Surface(s) Assessment (bed, cushion, seat, etc.) INTERVENTIONS - Wound Cleansing / Measurement X - Wound Imaging (photographs - any number of wounds) 1 5 []  - 0 Wound Tracing (instead of photographs) X- 1 5 Simple Wound Measurement - one wound []  - 0 Complex Wound Measurement - multiple wounds []  - 0 Simple Wound Cleansing - one wound []  - 0 Complex Wound Cleansing - multiple wounds INTERVENTIONS - Wound Dressings []  - Small Wound Dressing one or multiple wounds 0 X- 1 15 Medium Wound Dressing one or multiple wounds []  - 0 Large Wound Dressing one or multiple wounds []  - 0 Application of Medications - injection INTERVENTIONS - Miscellaneous []  - External ear exam 0 []  - 0 Specimen Collection (cultures, biopsies, blood, body fluids, etc.) []  - 0 Specimen(s) / Culture(s) sent or taken to Lab for analysis []  - 0 Patient Transfer (multiple staff / Civil Service fast streamer / Similar devices) []  - 0 Simple Staple / Suture removal (25 or less) []  - 0 Complex Staple / Suture removal (26 or more) []  - 0 Hypo / Hyperglycemic Management (close monitor of Blood Glucose) []  - 0 Ankle / Brachial Index (ABI) - do not check if billed separately Has the patient been seen at the hospital within the last three years: Yes Total Score: 80 Level Of Care: New/Established - Level 3 Electronic Signature(s) Signed: 11/08/2017 5:11:54 PM By: Gretta Cool, BSN, RN, CWS, Kim RN, BSN Entered By: Gretta Cool, BSN, RN, CWS, Kim on 11/08/2017 13:28:58 [name], [name]  ([id number]) -------------------------------------------------------------------------------- Encounter Discharge Information Details Patient Name: [name], [name] Date of Service: 11/08/2017 12:30 PM Medical Record Number: [id number] Patient Account Number: [id number] Date of Birth/Sex: September 20, 1927 (84 y.o. F) Treating RN: Roger Shelter Primary Care Ettore Trebilcock: Ria Bush Other Clinician: Referring Shey Yott: Ria Bush Treating Maryjane Benedict/Extender: Cathie Olden in Treatment: 0 Encounter Discharge Information Items Discharge Condition: Stable Ambulatory Status: Unstable Discharge Destination: Home Transportation: Private Auto Schedule Follow-up Appointment: Yes Clinical Summary of Care: Electronic Signature(s) Signed: 11/08/2017 1:45:59 PM By: Roger Shelter Entered By: Roger Shelter on 11/08/2017 13:33:23 Klingler, [name] ([id number]) -------------------------------------------------------------------------------- Lower Extremity Assessment Details Patient Name: [name], [name] Date of Service: 11/08/2017 12:30 PM Medical Record Number: [id number] Patient Account Number: [id number] Date of Birth/Sex: 06/08/1927 (84 y.o. F) Treating RN: Roger Shelter Primary Care Gemayel Mascio: Ria Bush Other Clinician: Referring Karolynn Infantino: Ria Bush Treating Goerge Mohr/Extender: Cathie Olden in Treatment: 0 Edema Assessment Assessed: [Left: No] [Right: No] Edema: [Left: N] [Right: o] Calf Left: Right: Point of Measurement: 30 cm From Medial Instep cm 26.5 cm Ankle Left: Right: Point of Measurement: 10 cm From Medial Instep cm 19.5 cm Vascular Assessment Claudication: Claudication Assessment [Right:None] Pulses: Dorsalis Pedis Palpable: [Right:Yes] Posterior Tibial Extremity colors, hair growth, and conditions: Extremity Color: [Right:Hyperpigmented] Hair Growth on Extremity: [Right:No] Temperature of Extremity: [Right:Warm] Capillary Refill:  [Right:< 3 seconds] Toe Nail Assessment Left: Right: Thick: Yes Discolored: Yes Deformed: No Improper Length and Hygiene: No Electronic Signature(s) Signed: 11/08/2017 1:45:59 PM By: Roger Shelter Entered By: Roger Shelter on 11/08/2017 13:08:15 Rettig, Galena C. ([id number]) -------------------------------------------------------------------------------- Multi Wound Chart Details Patient Name: [name], [name] Date of Service: 11/08/2017 12:30 PM Medical Record Number: [id number] Patient Account Number: [id number] Date of Birth/Sex: 1927/05/26 (84 y.o. F) Treating RN: Cornell Barman Primary Care Jaston Havens: Ria Bush Other Clinician: Referring Cordero Surette: Ria Bush Treating Koreen Lizaola/Extender: Cathie Olden in Treatment: 0 Vital Signs Height(in): 58 Pulse(bpm): 87 Weight(lbs): 147 Blood Pressure(mmHg): 111/68 Body Mass Index(BMI): 31  Temperature(F): 98.2 Respiratory Rate 18 (breaths/min): Photos: [6:No Photos] [N/A:N/A] Wound Location: [6:Right Lower Leg - Medial] [N/A:N/A] Wounding Event: [6:Trauma] [N/A:N/A] Primary Etiology: [6:Trauma, Other] [N/A:N/A] Comorbid History: [6:Cataracts, Anemia, Arrhythmia, Coronary Artery Disease, Hypertension, Myocardial Infarction, Type II Diabetes, Osteoarthritis] [N/A:N/A] Date Acquired: [6:10/08/2017] [N/A:N/A] Weeks of Treatment: [6:0] [N/A:N/A] Wound Status: [6:Open] [N/A:N/A] Measurements L x W x D [6:1x0.6x0.1] [N/A:N/A] (cm) Area (cm) : [6:0.471] [N/A:N/A] Volume (cm) : [6:0.047] [N/A:N/A] Classification: [6:Full Thickness Without Exposed Support Structures] [N/A:N/A] Exudate Amount: [6:Small] [N/A:N/A] Exudate Type: [6:Serous] [N/A:N/A] Exudate Color: [6:amber] [N/A:N/A] Wound Margin: [6:Distinct, outline attached] [N/A:N/A] Granulation Amount: [6:None Present (0%)] [N/A:N/A] Necrotic Amount: [6:Large (67-100%)] [N/A:N/A] Necrotic Tissue: [6:Eschar, Adherent Slough] [N/A:N/A] Exposed  Structures: [6:Fascia: No Fat Layer (Subcutaneous Tissue) Exposed: No Tendon: No Muscle: No Joint: No Bone: No] [N/A:N/A] Epithelialization: [6:None] [N/A:N/A] Periwound Skin Texture: [6:Excoriation: No Induration: No Callus: No Crepitus: No] [N/A:N/A] Rash: No Scarring: No Periwound Skin Moisture: Maceration: No N/A N/A Dry/Scaly: No Periwound Skin Color: Atrophie Blanche: No N/A N/A Cyanosis: No Ecchymosis: No Erythema: No Hemosiderin Staining: No Mottled: No Pallor: No Rubor: No Temperature: No Abnormality N/A N/A Tenderness on Palpation: Yes N/A N/A Wound Preparation: Ulcer Cleansing: N/A N/A Rinsed/Irrigated with Saline Topical Anesthetic Applied: Other: lidocaine 4% Treatment Notes Wound #6 (Right, Medial Lower Leg) 1. Cleansed with: Clean wound with Normal Saline 2. Anesthetic Topical Lidocaine 4% cream to wound bed prior to debridement 4. Dressing Applied: Other dressing (specify in notes) 5. Secondary Dressing Applied Bordered Foam Dressing Electronic Signature(s) Signed: 11/08/2017 1:39:10 PM By: Lawanda Cousins Entered By: Lawanda Cousins on 11/08/2017 13:39:10 Massey, [name] ([id number]) -------------------------------------------------------------------------------- [place] Details Patient Name: [name], [name] Date of Service: 11/08/2017 12:30 PM Medical Record Number: [id number] Patient Account Number: [id number] Date of Birth/Sex: [birth year] (84 y.o. F) Treating RN: Cornell Barman Primary Care Karmelo Bass: Ria Bush Other Clinician: Referring Maryclare Nydam: Ria Bush Treating Aimee Heldman/Extender: Cathie Olden in Treatment: 0 Active Inactive ` Orientation to the Wound Care Program Nursing Diagnoses: Knowledge deficit related to the wound healing center program Goals: Patient/caregiver will verbalize understanding of the [place] Program Date Initiated: 11/08/2017 Target Resolution Date: 11/17/2017 Goal  Status: Active Interventions: Provide education on orientation to the wound center Notes: Electronic Signature(s) Signed: 11/08/2017 5:11:54 PM By: Gretta Cool, BSN, RN, CWS, Kim RN, BSN Entered By: Gretta Cool, BSN, RN, CWS, Kim on 11/08/2017 13:26:06 Ayars, [name] ([id number]) -------------------------------------------------------------------------------- Pain Assessment Details Patient Name: [name], [name] Date of Service: 11/08/2017 12:30 PM Medical Record Number: [id number] Patient Account Number: [id number] Date of Birth/Sex: 20-Oct-1927 (84 y.o. F) Treating RN: Roger Shelter Primary Care Jakaria Lavergne: Ria Bush Other Clinician: Referring Leshawn Straka: Ria Bush Treating Ladarien Beeks/Extender: Cathie Olden in Treatment: 0 Active Problems Location of Pain Severity and Description of Pain Patient Has [name] Patient Unable to Respond Site Locations Rate the pain. Current Pain Level: 5 Character of Pain Describe the Pain: Aching Pain Management and Medication Current Pain Management: Electronic Signature(s) Signed: 11/08/2017 1:45:59 PM By: Roger Shelter Entered By: Roger Shelter on 11/08/2017 12:53:03 Varney, [name] ([id number]) -------------------------------------------------------------------------------- Patient/Caregiver Education Details Patient Name: [name], [name] Date of Service: 11/08/2017 12:30 PM Medical Record Number: [id number] Patient Account Number: [id number] Date of Birth/Gender: 1927-09-15 (84 y.o. F) Treating RN: Roger Shelter Primary Care Physician: Ria Bush Other Clinician: Referring Physician: Ria Bush Treating Physician/Extender: Cathie Olden in Treatment: 0 Education Assessment Education Provided To: Patient Education Topics Provided Wound/Skin Impairment: Handouts: Caring for Your Ulcer Methods: Explain/Verbal Responses: State content correctly  Electronic Signature(s) Signed: 11/08/2017 1:45:59 PM By:  Roger Shelter Entered By: Roger Shelter on 11/08/2017 13:34:02 [name], [name] ([id number]) -------------------------------------------------------------------------------- Wound Assessment Details Patient Name: [name], [name] Date of Service: 11/08/2017 12:30 PM Medical Record Number: [id number] Patient Account Number: [id number] Date of Birth/Sex: 09/20/1927 (84 y.o. F) Treating RN: Roger Shelter Primary Care Memory Heinrichs: Ria Bush Other Clinician: Referring Kyshaun Barnette: Ria Bush Treating Kmya Placide/Extender: Cathie Olden in Treatment: 0 Wound Status Wound Number: 6 Primary Trauma, Other Etiology: Wound Location: Right Lower Leg - Medial Wound Open Wounding Event: Trauma Status: Date Acquired: 10/08/2017 Comorbid Cataracts, Anemia, Arrhythmia, Coronary Artery Weeks Of Treatment: 0 History: Disease, Hypertension, Myocardial Infarction, Clustered Wound: No Type II Diabetes, Osteoarthritis Photos Photo Uploaded By: Roger Shelter on 11/08/2017 13:45:43 Wound Measurements Length: (cm) 1 Width: (cm) 0.6 Depth: (cm) 0.1 Area: (cm) 0.471 Volume: (cm) 0.047 % Reduction in Area: % Reduction in Volume: Epithelialization: None Tunneling: No Undermining: No Wound Description Full Thickness Without Exposed Support Classification: Structures Wound Margin: Distinct, outline attached Exudate Small Amount: Exudate Type: Serous Exudate Color: amber Foul Odor After Cleansing: No Slough/Fibrino Yes Wound Bed Granulation Amount: None Present (0%) Exposed Structure Necrotic Amount: Large (67-100%) Fascia Exposed: No Necrotic Quality: Eschar, Adherent Slough Fat Layer (Subcutaneous Tissue) Exposed: No Tendon Exposed: No Muscle Exposed: No Joint Exposed: No Bone Exposed: No Conard, Gabriana C. ([id number]) Periwound Skin Texture Texture Color No Abnormalities Noted: No No Abnormalities Noted: No Callus: No Atrophie Blanche: No Crepitus:  No Cyanosis: No Excoriation: No Ecchymosis: No Induration: No Erythema: No Rash: No Hemosiderin Staining: No Scarring: No Mottled: No Pallor: No Moisture Rubor: No No Abnormalities Noted: No Dry / Scaly: No Temperature / Pain Maceration: No Temperature: No Abnormality Tenderness on Palpation: Yes Wound Preparation Ulcer Cleansing: Rinsed/Irrigated with Saline Topical Anesthetic Applied: Other: lidocaine 4%, Treatment Notes Wound #6 (Right, Medial Lower Leg) 1. Cleansed with: Clean wound with Normal Saline 2. Anesthetic Topical Lidocaine 4% cream to wound bed prior to debridement 4. Dressing Applied: Other dressing (specify in notes) 5. Secondary Dressing Applied Bordered Foam Dressing Electronic Signature(s) Signed: 11/08/2017 1:45:59 PM By: Roger Shelter Entered By: Roger Shelter on 11/08/2017 13:01:22 Montfort, [name] ([id number]) -------------------------------------------------------------------------------- [place] Details Patient Name: [name], Sonita C. Date of Service: 11/08/2017 12:30 PM Medical Record Number: [id number] Patient Account Number: [id number] Date of Birth/Sex: 1927-07-06 (84 y.o. F) Treating RN: Roger Shelter Primary Care Carmisha Larusso: Ria Bush Other Clinician: Referring Janelli Welling: Ria Bush Treating Tadao Emig/Extender: Cathie Olden in Treatment: 0 Vital Signs Time Taken: 12:53 Temperature (F): 98.2 Height (in): 58 Pulse (bpm): 87 Source: Stated Respiratory Rate (breaths/min): 18 Weight (lbs): 147 Blood Pressure (mmHg): 111/68 Source: Measured Reference Range: 80 - 120 mg / dl Body Mass Index (BMI): 30.7 Electronic Signature(s) Signed: 11/08/2017 1:45:59 PM By: Roger Shelter Entered By: Roger Shelter on 11/08/2017 12:53:57

## 2017-11-19 NOTE — Progress Notes (Signed)
MAKALYNN, BERWANGER ([id number]) Visit Report for 11/08/2017 Chief Complaint Document Details Patient Name: [name], [name] Date of Service: 11/08/2017 12:30 PM Medical Record Number: [id number] Patient Account Number: [id number] Date of Birth/Sex: 12-13-1927 (84 y.o. F) Treating RN: Cornell Barman Primary Care Provider: Ria Bush Other Clinician: Referring Provider: Ria Bush Treating Provider/Extender: Cathie Olden in Treatment: 0 Information Obtained from: Patient Chief Complaint RLE wound Electronic Signature(s) Signed: 11/08/2017 1:39:29 PM By: Lawanda Cousins Entered By: Lawanda Cousins on 11/08/2017 13:39:29 Gamino, Tennille C. ([id number]) -------------------------------------------------------------------------------- HPI Details Patient Name: Kainz, Sukari C. Date of Service: 11/08/2017 12:30 PM Medical Record Number: [id number] Patient Account Number: [id number] Date of Birth/Sex: [birth year] (84 y.o. F) Treating RN: Cornell Barman Primary Care Provider: Ria Bush Other Clinician: Referring Provider: Ria Bush Treating Provider/Extender: Cathie Olden in Treatment: 0 History of Present Illness HPI Description: a/7/19-She is seen in initial evaluation for a right lower extremity wound. She states that roughly 4 weeks ago she bumped her leg on furniture in her home, creating an open wound. She presents today with a nearly healed area, pinpoint opening with scant-zero drainage, only visualized on cotton tip applicator. She admits to osseus discomfort and throbbing over reticular and varicose veins; there is no evidence of infection. We will treat with silvercel and bordered foam dressing. She will follow up sometime next week at which time I anticipate discharging her. Electronic Signature(s) Signed: 11/08/2017 2:43:43 PM By: Lawanda Cousins Previous Signature: 11/08/2017 1:42:42 PM Version By: Lawanda Cousins Entered By: Lawanda Cousins on 11/08/2017  14:43:43 Goodlow, [name] ([id number]) -------------------------------------------------------------------------------- Physical Exam Details Patient Name: Canela, Kiffany C. Date of Service: 11/08/2017 12:30 PM Medical Record Number: [id number] Patient Account Number: [id number] Date of Birth/Sex: 11-08-1927 (84 y.o. F) Treating RN: Cornell Barman Primary Care Provider: Ria Bush Other Clinician: Referring Provider: Ria Bush Treating Provider/Extender: Cathie Olden in Treatment: 0 Respiratory respirations are even and unlabored. clear throughout. Cardiovascular s1 s2 regular rate and rhythm. RLE- non-palpable DP, PT. RLE- varicose and reticular veins, warm to touch, no cyanosis, hyperpigmentation. Musculoskeletal ambulates with cane. Psychiatric . oriented to time, place, person and situation. calm, cooperative. Electronic Signature(s) Signed: 11/08/2017 1:43:57 PM By: Lawanda Cousins Entered By: Lawanda Cousins on 11/08/2017 13:43:57 Nagengast, [name] ([id number]) -------------------------------------------------------------------------------- Physician Orders Details Patient Name: Kobler, Maydelin C. Date of Service: 11/08/2017 12:30 PM Medical Record Number: [id number] Patient Account Number: [id number] Date of Birth/Sex: [birth year] (84 y.o. F) Treating RN: Cornell Barman Primary Care Provider: Ria Bush Other Clinician: Referring Provider: Ria Bush Treating Provider/Extender: Cathie Olden in Treatment: 0 Verbal / Phone Orders: No Diagnosis Coding Primary Wound Dressing Wound #6 Right,Medial Lower Leg o Silver Alginate Secondary Dressing Wound #6 Right,Medial Lower Leg o Boardered Foam Dressing Follow-up Appointments Wound #6 Right,Medial Lower Leg o Nurse Visit as needed - Monday Electronic Signature(s) Signed: 11/08/2017 4:31:15 PM By: Lawanda Cousins Signed: 11/08/2017 5:11:54 PM By: Gretta Cool, BSN, RN, CWS, Kim RN, BSN Entered By: Gretta Cool,  BSN, RN, CWS, Kim on 11/08/2017 13:27:49 Thibeau, [name] ([id number]) -------------------------------------------------------------------------------- Problem List Details Patient Name: Silbaugh, Daleen C. Date of Service: 11/08/2017 12:30 PM Medical Record Number: [id number] Patient Account Number: [id number] Date of Birth/Sex: September 30, 1927 (84 y.o. F) Treating RN: Cornell Barman Primary Care Provider: Ria Bush Other Clinician: Referring Provider: Ria Bush Treating Provider/Extender: Cathie Olden in Treatment: 0 Active Problems ICD-10 Evaluated Encounter Code Description Active Date Today Diagnosis S81.801S Unspecified open wound, right lower leg, sequela 11/08/2017 No Yes Inactive Problems Resolved Problems Electronic Signature(s)  Signed: 11/08/2017 1:29:10 PM By: Lawanda Cousins Entered By: Lawanda Cousins on 11/08/2017 13:29:10 Lauf, [name] ([id number]) -------------------------------------------------------------------------------- Progress Note Details Patient Name: Agostinelli, Zoey C. Date of Service: 11/08/2017 12:30 PM Medical Record Number: [id number] Patient Account Number: [id number] Date of Birth/Sex: September 02, 1927 (84 y.o. F) Treating RN: Cornell Barman Primary Care Provider: Ria Bush Other Clinician: Referring Provider: Ria Bush Treating Provider/Extender: Cathie Olden in Treatment: 0 Subjective Chief Complaint Information obtained from Patient RLE wound History of Present Illness (HPI) a/7/19-She is seen in initial evaluation for a right lower extremity wound. She states that roughly 4 weeks ago she bumped her leg on furniture in her home, creating an open wound. She presents today with a nearly healed area, pinpoint opening with scant-zero drainage, only visualized on cotton tip applicator. She admits to osseus discomfort and throbbing over reticular and varicose veins; there is no evidence of infection. We will treat with silvercel  and bordered foam dressing. She will follow up sometime next week at which time I anticipate discharging her. Wound History Patient presents with 1 open wound that has been present for approximately 4 weeks. Laboratory tests have not been performed in the last month. Patient reportedly has not tested positive for an antibiotic resistant organism. Patient reportedly has not tested positive for osteomyelitis. Patient reportedly has not had testing performed to evaluate circulation in the legs. Patient History Information obtained from Patient. Allergies No Known Allergies Family History Diabetes - Father,Siblings,Child, Hypertension - Child, Lung Disease - Child, Stroke - Mother, No family history of Cancer, Heart Disease, Kidney Disease, Seizures, Thyroid Problems. Social History Former smoker, Marital Status - Single, Alcohol Use - Never, Drug Use - No History, Caffeine Use - Daily. Medical And Surgical History Notes Constitutional Symptoms (General Health) High Blood Pressure, open heart surgery, gall stone, hysterectomy; Fluid pill, Anxiety Objective Villalona, Enijah C. ([id number]) Constitutional Vitals Time Taken: 12:53 PM, Height: 58 in, Source: Stated, Weight: 147 lbs, Source: Measured, BMI: 30.7, Temperature: 98.2 F, Pulse: 87 bpm, Respiratory Rate: 18 breaths/min, Blood Pressure: 111/68 mmHg. Respiratory respirations are even and unlabored. clear throughout. Cardiovascular s1 s2 regular rate and rhythm. RLE- non-palpable DP, PT. RLE- varicose and reticular veins, warm to touch, no cyanosis, hyperpigmentation. Musculoskeletal ambulates with cane. Psychiatric oriented to time, place, person and situation. calm, cooperative. Integumentary (Hair, Skin) Wound #6 status is Open. Original cause of wound was Trauma. The wound is located on the Right,Medial Lower Leg. The wound measures 1cm length x 0.6cm width x 0.1cm depth; 0.471cm^2 area and 0.047cm^3 volume. There is no tunneling  or undermining noted. There is a small amount of serous drainage noted. The wound margin is distinct with the outline attached to the wound base. There is no granulation within the wound bed. There is a large (67-100%) amount of necrotic tissue within the wound bed including Eschar and Adherent Slough. The periwound skin appearance did not exhibit: Callus, Crepitus, Excoriation, Induration, Rash, Scarring, Dry/Scaly, Maceration, Atrophie Blanche, Cyanosis, Ecchymosis, Hemosiderin Staining, Mottled, Pallor, Rubor, Erythema. Periwound temperature was noted as No Abnormality. The periwound has tenderness on palpation. Assessment Active Problems ICD-10 Unspecified open wound, right lower leg, sequela Plan Primary Wound Dressing: Wound #6 Right,Medial Lower Leg: Silver Alginate Secondary Dressing: Wound #6 Right,Medial Lower Leg: Boardered Foam Dressing Follow-up Appointments: Wound #6 Right,Medial Lower Leg: Nurse Visit as needed - Monday DENETRA, FORMOSO ([id number]) Electronic Signature(s) Signed: 11/08/2017 2:43:59 PM By: Lawanda Cousins Previous Signature: 11/08/2017 1:44:10 PM Version By: Lawanda Cousins Entered By: Lawanda Cousins  on 11/08/2017 14:43:58 Picco, Kaari C. ([id number]) -------------------------------------------------------------------------------- ROS/PFSH Details Patient Name: Dampier, Nancylee C. Date of Service: 11/08/2017 12:30 PM Medical Record Number: [id number] Patient Account Number: [id number] Date of Birth/Sex: [birth year] (84 y.o. F) Treating RN: Roger Shelter Primary Care Provider: Ria Bush Other Clinician: Referring Provider: Ria Bush Treating Provider/Extender: Cathie Olden in Treatment: 0 Information Obtained From Patient Wound History Do you currently have one or more open woundso Yes How many open wounds do you currently haveo 1 Approximately how long have you had your woundso 4 weeks Has your wound(s) ever healed and then  re-openedo No Have you had any lab work done in the past montho No Have you tested positive for an antibiotic resistant organism (MRSA, VRE)o No Have you tested positive for osteomyelitis (bone infection)o No Have you had any tests for circulation on your legso No Constitutional Symptoms (General Health) Medical History: Past Medical History Notes: High Blood Pressure, open heart surgery, gall stone, hysterectomy; Fluid pill, Anxiety Eyes Medical History: Positive for: Cataracts - removed Negative for: Glaucoma; Optic Neuritis Hematologic/Lymphatic Medical History: Positive for: Anemia Negative for: Hemophilia; Human Immunodeficiency Virus; Lymphedema; Sickle Cell Disease Respiratory Medical History: Negative for: Aspiration; Asthma; Chronic Obstructive Pulmonary Disease (COPD); Pneumothorax; Sleep Apnea; Tuberculosis Cardiovascular Medical History: Positive for: Arrhythmia; Coronary Artery Disease; Hypertension; Myocardial Infarction Negative for: Angina; Congestive Heart Failure; Deep Vein Thrombosis; Hypotension; Peripheral Arterial Disease; Peripheral Venous Disease; Phlebitis Endocrine Medical History: Positive for: Type II Diabetes Negative for: Type I Diabetes Time with diabetes: no idea Bagent, Marikay C. ([id number]) Treated with: Oral agents Blood sugar tested every day: No Genitourinary Medical History: Negative for: End Stage Renal Disease Immunological Medical History: Negative for: Lupus Erythematosus; Raynaudos; Scleroderma Integumentary (Skin) Medical History: Negative for: History of Burn; History of pressure wounds Musculoskeletal Medical History: Positive for: Osteoarthritis Negative for: Gout; Rheumatoid Arthritis; Osteomyelitis Neurologic Medical History: Negative for: Dementia; Neuropathy; Quadriplegia; Paraplegia; Seizure Disorder Oncologic Medical History: Negative for: Received Chemotherapy; Received Radiation HBO Extended History  Items Eyes: Cataracts Immunizations Pneumococcal Vaccine: Received Pneumococcal Vaccination: No Implantable Devices Family and Social History Cancer: No; Diabetes: Yes - Father,Siblings,Child; Heart Disease: No; Hypertension: Yes - Child; Kidney Disease: No; Lung Disease: Yes - Child; Seizures: No; Stroke: Yes - Mother; Thyroid Problems: No; Former smoker; Marital Status - Single; Alcohol Use: Never; Drug Use: No History; Caffeine Use: Daily; Financial Concerns: No; Food, Clothing or Shelter Needs: No; Support System Lacking: No; Transportation Concerns: No; Advanced Directives: Yes (Not Provided); Patient does not want information on Advanced Directives; Living Will: Yes (Not Provided) Electronic Signature(s) Signed: 11/08/2017 1:45:59 PM By: Roger Shelter Signed: 11/08/2017 4:31:15 PM By: Lawanda Cousins Entered By: Roger Shelter on 11/08/2017 12:54:44 Zubiate, Maycie C. ([id number]) -------------------------------------------------------------------------------- SuperBill Details Patient Name: Buerger, Neviah C. Date of Service: 11/08/2017 Medical Record Number: [id number] Patient Account Number: [id number] Date of Birth/Sex: 1927-06-24 (84 y.o. F) Treating RN: Cornell Barman Primary Care Provider: Ria Bush Other Clinician: Referring Provider: Ria Bush Treating Provider/Extender: Cathie Olden in Treatment: 0 Diagnosis Coding ICD-10 Codes Code Description J82.505L Unspecified open wound, right lower leg, sequela Facility Procedures CPT4 Code: [id number] Description: [place] VISIT-LEV 3 EST PT Modifier: Quantity: 1 Physician Procedures CPT4 Code: [id number] Description: 97353 - WC PHYS LEVEL 3 - EST PT ICD-10 Diagnosis Description G99.242A Unspecified open wound, right lower leg, sequela Modifier: Quantity: 1 Electronic Signature(s) Signed: 11/08/2017 1:44:23 PM By: Lawanda Cousins Entered By: Lawanda Cousins on 11/08/2017 13:44:23

## 2017-11-21 ENCOUNTER — Other Ambulatory Visit: Payer: Self-pay | Admitting: Family Medicine

## 2017-11-30 NOTE — Progress Notes (Signed)
LOSSIE, KALP ([id number]) Visit Report for 11/16/2017 Chief Complaint Document Details Patient Name: [name], [name] Date of Service: 11/16/2017 1:30 PM Medical Record Number: [id number] Patient Account Number: [id number] Date of Birth/Sex: 1927-10-18 (84 y.o. F) Treating RN: Roger Shelter Primary Care Provider: Ria Bush Other Clinician: Referring Provider: Ria Bush Treating Provider/Extender: Cathie Olden in Treatment: 1 Information Obtained from: Patient Chief Complaint RLE wound Electronic Signature(s) Signed: 11/16/2017 1:53:00 PM By: Lawanda Cousins Entered By: Lawanda Cousins on 11/16/2017 13:52:59 [name], [name] ([id number]) -------------------------------------------------------------------------------- HPI Details Patient Name: [name], [name] Date of Service: 11/16/2017 1:30 PM Medical Record Number: [id number] Patient Account Number: [id number] Date of Birth/Sex: 06/16/1927 (84 y.o. F) Treating RN: Roger Shelter Primary Care Provider: Ria Bush Other Clinician: Referring Provider: Ria Bush Treating Provider/Extender: Cathie Olden in Treatment: 1 History of Present Illness HPI Description: 11/08/17-She is seen in initial evaluation for a right lower extremity wound. She states that roughly 4 weeks ago she bumped her leg on furniture in her home, creating an open wound. She presents today with a nearly healed area, pinpoint opening with scant-zero drainage, only visualized on cotton tip applicator. She admits to osseus discomfort and throbbing over reticular and varicose veins; there is no evidence of infection. We will treat with silvercel and bordered foam dressing. She will follow up sometime next week at which time I anticipate discharging her. 11/16/17-She is seen in follow-up evaluation for right lower cavity wound. She is healed and will be discharged from wound care services Electronic Signature(s) Signed:  11/16/2017 1:53:34 PM By: Lawanda Cousins Entered By: Lawanda Cousins on 11/16/2017 13:53:34 [name], [name] ([id number]) -------------------------------------------------------------------------------- Physician Orders Details Patient Name: [name], [name] Date of Service: 11/16/2017 1:30 PM Medical Record Number: [id number] Patient Account Number: [id number] Date of Birth/Sex: 01/31/1928 (84 y.o. F) Treating RN: Secundino Ginger Primary Care Provider: Ria Bush Other Clinician: Referring Provider: Ria Bush Treating Provider/Extender: Cathie Olden in Treatment: 1 Verbal / Phone Orders: No Diagnosis Coding Discharge From [facility] o Discharge from [place] - treatment completed Electronic Signature(s) Signed: 11/16/2017 3:17:24 PM By: Secundino Ginger Signed: 11/16/2017 4:32:21 PM By: Lawanda Cousins Entered By: Secundino Ginger on 11/16/2017 13:45:04 [name], [name] ([id number]) -------------------------------------------------------------------------------- Problem List Details Patient Name: [name], [name] Date of Service: 11/16/2017 1:30 PM Medical Record Number: [id number] Patient Account Number: [id number] Date of Birth/Sex: 1928-03-05 (84 y.o. F) Treating RN: Roger Shelter Primary Care Provider: Ria Bush Other Clinician: Referring Provider: Ria Bush Treating Provider/Extender: Cathie Olden in Treatment: 1 Active Problems ICD-10 Evaluated Encounter Code Description Active Date Today Diagnosis S81.801S Unspecified open wound, right lower leg, sequela 11/08/2017 No Yes Inactive Problems Resolved Problems Electronic Signature(s) Signed: 11/16/2017 1:52:47 PM By: Lawanda Cousins Entered By: Lawanda Cousins on 11/16/2017 13:52:47 [name], [name] ([id number]) -------------------------------------------------------------------------------- Progress Note Details Patient Name: [name], [name] Date of Service: 11/16/2017 1:30 PM Medical  Record Number: [id number] Patient Account Number: [id number] Date of Birth/Sex: November 26, 1927 (84 y.o. F) Treating RN: Roger Shelter Primary Care Provider: Ria Bush Other Clinician: Referring Provider: Ria Bush Treating Provider/Extender: Cathie Olden in Treatment: 1 Subjective Chief Complaint Information obtained from Patient RLE wound History of Present Illness (HPI) 11/08/17-She is seen in initial evaluation for a right lower extremity wound. She states that roughly 4 weeks ago she bumped her leg on furniture in her home, creating an open wound. She presents today with a nearly healed area, pinpoint opening with scant-zero drainage, only visualized on cotton tip applicator.  She admits to osseus discomfort and throbbing over reticular and varicose veins; there is no evidence of infection. We will treat with silvercel and bordered foam dressing. She will follow up sometime next week at which time I anticipate discharging her. 11/16/17-She is seen in follow-up evaluation for right lower cavity wound. She is healed and will be discharged from wound care services Objective Constitutional Vitals Time Taken: 1:36 PM, Height: 58 in, Weight: 147 lbs, BMI: 30.7, Temperature: 98.2 F, Pulse: 80 bpm, Respiratory Rate: 16 breaths/min, Blood Pressure: 126/90 mmHg. Integumentary (Hair, Skin) Wound #6 status is Healed - Epithelialized. Original cause of wound was Trauma. The wound is located on the Right,Medial Lower Leg. The wound measures 0cm length x 0cm width x 0cm depth; 0cm^2 area and 0cm^3 volume. Assessment Active Problems ICD-10 Unspecified open wound, right lower leg, sequela [name], [name] ([id number]) Plan Discharge From [facility] Services: Discharge from [place] - treatment completed Electronic Signature(s) Signed: 11/16/2017 1:53:44 PM By: Lawanda Cousins Entered By: Lawanda Cousins on 11/16/2017 13:53:44 [name], [name]  ([id number]) -------------------------------------------------------------------------------- [place] Details Patient Name: [name], [name] Date of Service: 11/16/2017 Medical Record Number: [id number] Patient Account Number: [id number] Date of Birth/Sex: 07-26-1927 (84 y.o. F) Treating RN: Secundino Ginger Primary Care Provider: Ria Bush Other Clinician: Referring Provider: Ria Bush Treating Provider/Extender: Cathie Olden in Treatment: 1 Diagnosis Coding ICD-10 Codes Code Description B37.943E Unspecified open wound, right lower leg, sequela Facility Procedures CPT4 Code: [id number] Description: [phone] - WOUND CARE VISIT-LEV 2 EST PT Modifier: Quantity: 1 Physician Procedures CPT4 Code: [id number] Description: 64383 - WC PHYS LEVEL 2 - EST PT ICD-10 Diagnosis Description K18.403F Unspecified open wound, right lower leg, sequela Modifier: Quantity: 1 Electronic Signature(s) Signed: 11/16/2017 1:54:04 PM By: Lawanda Cousins Entered By: Lawanda Cousins on 11/16/2017 13:54:04

## 2017-11-30 NOTE — Progress Notes (Signed)
[name], [name] ([id number]) Visit Report for 11/16/2017 Arrival Information Details Patient Name: [name], [name] Date of Service: 11/16/2017 1:30 PM Medical Record Number: [id number] Patient Account Number: [id number] Date of Birth/Sex: [birth year] (84 y.o. F) Treating RN: Secundino Ginger Primary Care Elene Downum: Ria Bush Other Clinician: Referring Tracker Mance: Ria Bush Treating Yoali Conry/Extender: Cathie Olden in Treatment: 1 Visit Information History Since Last Visit Added or deleted any medications: No Patient Arrived: Ambulatory Any new allergies or adverse reactions: No Arrival Time: 13:33 Had a fall or experienced change in No Accompanied By: self activities of daily living that may affect Transfer Assistance: None risk of falls: Patient Identification Verified: Yes Signs or symptoms of abuse/neglect since last visito No Secondary Verification Process Completed: Yes Hospitalized since last visit: No Implantable device outside of the clinic excluding No cellular tissue based products placed in the center since last visit: Has Dressing in Place as Prescribed: Yes Pain Present Now: No Electronic Signature(s) Signed: 11/16/2017 3:17:24 PM By: Secundino Ginger Entered By: Secundino Ginger on 11/16/2017 13:36:27 [name] ([id number]) -------------------------------------------------------------------------------- Clinic Level of Care Assessment Details Patient Name: [name], [name] Date of Service: 11/16/2017 1:30 PM Medical Record Number: [id number] Patient Account Number: [id number] Date of Birth/Sex: Jul 27, 1927 (84 y.o. F) Treating RN: Secundino Ginger Primary Care Ryu Cerreta: Ria Bush Other Clinician: Referring Tashiba Timoney: Ria Bush Treating Mehdi Gironda/Extender: Cathie Olden in Treatment: 1 Clinic Level of Care Assessment Items TOOL 4 Quantity Score X - Use when only an EandM is performed on FOLLOW-UP visit 1 0 ASSESSMENTS - Nursing Assessment /  Reassessment X - Reassessment of Co-morbidities (includes updates in patient status) 1 10 X- 1 5 Reassessment of Adherence to Treatment Plan ASSESSMENTS - Wound and Skin Assessment / Reassessment X - Simple Wound Assessment / Reassessment - one wound 1 5 []  - 0 Complex Wound Assessment / Reassessment - multiple wounds []  - 0 Dermatologic / Skin Assessment (not related to wound area) ASSESSMENTS - Focused Assessment []  - Circumferential Edema Measurements - multi extremities 0 []  - 0 Nutritional Assessment / Counseling / Intervention []  - 0 Lower Extremity Assessment (monofilament, tuning fork, pulses) []  - 0 Peripheral Arterial Disease Assessment (using hand held doppler) ASSESSMENTS - Ostomy and/or Continence Assessment and Care []  - Incontinence Assessment and Management 0 []  - 0 Ostomy Care Assessment and Management (repouching, etc.) PROCESS - Coordination of Care X - Simple Patient / Family Education for ongoing care 1 15 []  - 0 Complex (extensive) Patient / Family Education for ongoing care []  - 0 Staff obtains Programmer, systems, Records, Test Results / Process Orders []  - 0 Staff telephones HHA, Nursing Homes / Clarify orders / etc []  - 0 Routine Transfer to another Facility (non-emergent condition) []  - 0 Routine Hospital Admission (non-emergent condition) []  - 0 New Admissions / Biomedical engineer / Ordering NPWT, Apligraf, etc. []  - 0 Emergency Hospital Admission (emergent condition) X- 1 10 Simple Discharge Coordination Janicki, Prezley C. ([id number]) []  - 0 Complex (extensive) Discharge Coordination PROCESS - Special Needs []  - Pediatric / Minor Patient Management 0 []  - 0 Isolation Patient Management []  - 0 Hearing / Language / Visual special needs []  - 0 Assessment of Community assistance (transportation, D/C planning, etc.) []  - 0 Additional assistance / Altered mentation []  - 0 Support Surface(s) Assessment (bed, cushion, seat, etc.) INTERVENTIONS -  Wound Cleansing / Measurement X - Simple Wound Cleansing - one wound 1 5 []  - 0 Complex Wound Cleansing - multiple wounds X- 1 5 Wound Imaging (photographs -  any number of wounds) []  - 0 Wound Tracing (instead of photographs) X- 1 5 Simple Wound Measurement - one wound []  - 0 Complex Wound Measurement - multiple wounds INTERVENTIONS - Wound Dressings X - Small Wound Dressing one or multiple wounds 1 10 []  - 0 Medium Wound Dressing one or multiple wounds []  - 0 Large Wound Dressing one or multiple wounds []  - 0 Application of Medications - topical []  - 0 Application of Medications - injection INTERVENTIONS - Miscellaneous []  - External ear exam 0 []  - 0 Specimen Collection (cultures, biopsies, blood, body fluids, etc.) []  - 0 Specimen(s) / Culture(s) sent or taken to Lab for analysis []  - 0 Patient Transfer (multiple staff / Civil Service fast streamer / Similar devices) []  - 0 Simple Staple / Suture removal (25 or less) []  - 0 Complex Staple / Suture removal (26 or more) []  - 0 Hypo / Hyperglycemic Management (close monitor of Blood Glucose) []  - 0 Ankle / Brachial Index (ABI) - do not check if billed separately X- 1 5 Vital Signs [name], [name] ([id number]) Has the patient been seen at the hospital within the last three years: Yes Total Score: 75 Level Of Care: New/Established - Level 2 Electronic Signature(s) Signed: 11/16/2017 3:17:24 PM By: Secundino Ginger Entered By: Secundino Ginger on 11/16/2017 13:45:54 [name] ([id number]) -------------------------------------------------------------------------------- Encounter Discharge Information Details Patient Name: [name], [name] Date of Service: 11/16/2017 1:30 PM Medical Record Number: [id number] Patient Account Number: [id number] Date of Birth/Sex: 1927/11/13 (84 y.o. F) Treating RN: Secundino Ginger Primary Care Mikhael Hendriks: Ria Bush Other Clinician: Referring Ladene Allocca: Ria Bush Treating Christian Treadway/Extender: Cathie Olden in Treatment: 1 Encounter Discharge Information Items Discharge Condition: Stable Ambulatory Status: Ambulatory Discharge Destination: Home Transportation: Private Auto Schedule Follow-up Appointment: Yes Clinical Summary of Care: Electronic Signature(s) Signed: 11/16/2017 3:17:24 PM By: Secundino Ginger Entered By: Secundino Ginger on 11/16/2017 13:46:31 Wender, [name] ([id number]) -------------------------------------------------------------------------------- Lower Extremity Assessment Details Patient Name: [name], [name] Date of Service: 11/16/2017 1:30 PM Medical Record Number: [id number] Patient Account Number: [id number] Date of Birth/Sex: Aug 10, 1927 (84 y.o. F) Treating RN: Secundino Ginger Primary Care Braian Tijerina: Ria Bush Other Clinician: Referring Argelia Formisano: Ria Bush Treating Zakirah Weingart/Extender: Cathie Olden in Treatment: 1 Edema Assessment Assessed: [Left: No] [Right: No] [Left: Edema] [Right: :] Calf Left: Right: Point of Measurement: 30 cm From Medial Instep 28.5 cm cm Ankle Left: Right: Point of Measurement: 10 cm From Medial Instep 19 cm cm Vascular Assessment Pulses: Dorsalis Pedis Palpable: [Left:Yes] Posterior Tibial Extremity colors, hair growth, and conditions: Extremity Color: [Left:Normal] Hair Growth on Extremity: [Left:No] Temperature of Extremity: [Left:Warm] Capillary Refill: [Left:< 3 seconds] Toe Nail Assessment Left: Right: Thick: Yes Discolored: Yes Deformed: Yes Improper Length and Hygiene: No Electronic Signature(s) Signed: 11/16/2017 3:17:24 PM By: Secundino Ginger Entered By: Secundino Ginger on 11/16/2017 13:41:23 [name], [name] ([id number]) -------------------------------------------------------------------------------- Multi Wound Chart Details Patient Name: [name], [name] Date of Service: 11/16/2017 1:30 PM Medical Record Number: [id number] Patient Account Number: [id number] Date of Birth/Sex: 05/29/1927 (84 y.o.  F) Treating RN: Secundino Ginger Primary Care Lilou Kneip: Ria Bush Other Clinician: Referring Emory Leaver: Ria Bush Treating Kinsey Karch/Extender: Cathie Olden in Treatment: 1 Vital Signs Height(in): 58 Pulse(bpm): 80 Weight(lbs): 147 Blood Pressure(mmHg): 126/90 Body Mass Index(BMI): 31 Temperature(F): 98.2 Respiratory Rate 16 (breaths/min): Photos: [6:No Photos] [N/A:N/A] Wound Location: [6:Right, Medial Lower Leg] [N/A:N/A] Wounding Event: [6:Trauma] [N/A:N/A] Primary Etiology: [6:Trauma, Other] [N/A:N/A] Date Acquired: [6:10/08/2017] [N/A:N/A] Weeks of Treatment: [6:1] [N/A:N/A] Wound Status: [6:Healed - Epithelialized] [N/A:N/A]  Measurements L x W x D [6:0x0x0] [N/A:N/A] (cm) Area (cm) : [6:0] [N/A:N/A] Volume (cm) : [6:0] [N/A:N/A] % Reduction in Area: [6:100.00%] [N/A:N/A] % Reduction in Volume: [6:100.00%] [N/A:N/A] Classification: [6:Full Thickness Without Exposed Support Structures] [N/A:N/A] Periwound Skin Texture: [6:No Abnormalities Noted] [N/A:N/A] Periwound Skin Moisture: [6:No Abnormalities Noted] [N/A:N/A] Periwound Skin Color: [6:No Abnormalities Noted No] [N/A:N/A N/A] Treatment Notes Electronic Signature(s) Signed: 11/16/2017 1:52:52 PM By: Lawanda Cousins Entered By: Lawanda Cousins on 11/16/2017 13:52:52 Jim, [name] ([id number]) -------------------------------------------------------------------------------- Junction Details Patient Name: [name], [name] Date of Service: 11/16/2017 1:30 PM Medical Record Number: [id number] Patient Account Number: [id number] Date of Birth/Sex: 1928-01-29 (84 y.o. F) Treating RN: Secundino Ginger Primary Care Daoud Lobue: Ria Bush Other Clinician: Referring [name]: Ria Bush Treating Ellice Boultinghouse/Extender: Cathie Olden in Treatment: 1 Active Inactive Electronic Signature(s) Signed: 11/16/2017 2:06:43 PM By: Roger Shelter Signed: 11/16/2017 3:17:24 PM By: Secundino Ginger Entered By: Roger Shelter on 11/16/2017 13:53:51 [name], [name] ([id number]) -------------------------------------------------------------------------------- Pain Assessment Details Patient Name: [name], [name] Date of Service: 11/16/2017 1:30 PM Medical Record Number: [id number] Patient Account Number: [id number] Date of Birth/Sex: 1927/07/05 (84 y.o. F) Treating RN: Secundino Ginger Primary Care Jerusalem Brownstein: Ria Bush Other Clinician: Referring Zade Falkner: Ria Bush Treating Jamieka Royle/Extender: Cathie Olden in Treatment: 1 Active Problems Location of Pain Severity and Description of Pain Patient Has [name] Yes Site Locations Pain Location: Pain in Ulcers Pain Management and Medication Current Pain Management: Electronic Signature(s) Signed: 11/16/2017 3:17:24 PM By: Secundino Ginger Entered By: Secundino Ginger on 11/16/2017 13:36:40 Townsel, [name] ([id number]) -------------------------------------------------------------------------------- Patient/Caregiver Education Details Patient Name: Polkowski, Bryndle C. Date of Service: 11/16/2017 1:30 PM Medical Record Number: [id number] Patient Account Number: [id number] Date of Birth/Gender: December 19, 1927 (84 y.o. F) Treating RN: Secundino Ginger Primary Care Physician: Ria Bush Other Clinician: Referring Physician: Ria Bush Treating Physician/Extender: Cathie Olden in Treatment: 1 Education Assessment Education Provided To: Patient Education Topics Provided Wound/Skin Impairment: Handouts: Skin Care Do's and Dont's Methods: Explain/Verbal Responses: State content correctly Electronic Signature(s) Signed: 11/16/2017 3:17:24 PM By: Secundino Ginger Entered By: Secundino Ginger on 11/16/2017 13:46:45 Clauss, Quanda C. ([id number]) -------------------------------------------------------------------------------- Wound Assessment Details Patient Name: Berrie, Peja C. Date of Service: 11/16/2017 1:30 PM Medical Record  Number: [id number] Patient Account Number: [id number] Date of Birth/Sex: 03-21-1928 (84 y.o. F) Treating RN: Secundino Ginger Primary Care Josalin Carneiro: Ria Bush Other Clinician: Referring Zyir Gassert: Ria Bush Treating Shantara Goosby/Extender: Cathie Olden in Treatment: 1 Wound Status Wound Number: 6 Primary Etiology: Trauma, Other Wound Location: Right, Medial Lower Leg Wound Status: Healed - Epithelialized Wounding Event: Trauma Date Acquired: 10/08/2017 Weeks Of Treatment: 1 Clustered Wound: No Wound Measurements Length: (cm) 0 Width: (cm) 0 Depth: (cm) 0 Area: (cm) 0 Volume: (cm) 0 % Reduction in Area: 100% % Reduction in Volume: 100% Wound Description Full Thickness Without Exposed Support Classification: Structures Periwound Skin Texture Texture Color No Abnormalities Noted: No No Abnormalities Noted: No Moisture No Abnormalities Noted: No Electronic Signature(s) Signed: 11/16/2017 3:17:24 PM By: Secundino Ginger Entered By: Secundino Ginger on 11/16/2017 13:39:21 Ternes, [name] ([id number]) -------------------------------------------------------------------------------- Vitals Details Patient Name: Daugherty, Maui C. Date of Service: 11/16/2017 1:30 PM Medical Record Number: [id number] Patient Account Number: [id number] Date of Birth/Sex: 1927-10-30 (84 y.o. F) Treating RN: Secundino Ginger Primary Care Loyal Holzheimer: Ria Bush Other Clinician: Referring Micky Overturf: Ria Bush Treating Amsi Grimley/Extender: Cathie Olden in Treatment: 1 Vital Signs Time Taken: 13:36 Temperature (F): 98.2 Height (in): 58 Pulse (bpm): 80 Weight (lbs): 147 Respiratory Rate (breaths/min): 16 Body  Mass Index (BMI): 30.7 Blood Pressure (mmHg): 126/90 Reference Range: 80 - 120 mg / dl Electronic Signature(s) Signed: 11/16/2017 3:17:24 PM By: Secundino Ginger Entered By: Secundino Ginger on 11/16/2017 13:37:10

## 2017-12-05 ENCOUNTER — Other Ambulatory Visit: Payer: Self-pay | Admitting: Family Medicine

## 2017-12-05 NOTE — Telephone Encounter (Signed)
Name of Medication: Hydrocodone apap 5-325 Name of Pharmacy: [facility] [place] or Written Date and Quantity:# 44 on 10/11/17  Last Office Visit and Type: 10/30/17 wound ck Next Office Visit and Type: 01/29/18 CPX Last Controlled Substance Agreement Date: 10/16/17 Last UDS:10/11/17

## 2017-12-05 NOTE — Telephone Encounter (Signed)
Copied from [place] [id number]. Topic: Quick Communication - Rx Refill/Question >> Dec 05, 2017 12:57 PM Mylinda Latina, NT wrote: Medication: HYDROcodone-acetaminophen (NORCO/VICODIN) 5-325 MG tablet   Has the patient contacted their pharmacy? Yes, she was told to call office (Agent: If no, request that the patient contact the pharmacy for the refill.) (Agent: If yes, when and what did the pharmacy advise?)  Preferred Pharmacy (with phone number or street name): [id number] [place], [place] [phone] (Phone) [phone] (Fax)    Agent: Please be advised that RX refills may take up to 3 business days. We ask that you follow-up with your pharmacy.

## 2017-12-05 NOTE — Telephone Encounter (Signed)
Refill of hydrocodone  LOV 10/30/17 Dr. Danise Mina  [facility] 10/11/17  #60  0 refills   [id number] [place],  [place] [address]         [phone] (Phone) [phone] (Fax)

## 2017-12-06 MED ORDER — HYDROCODONE-ACETAMINOPHEN 5-325 MG PO TABS: 0.5000 | ORAL_TABLET | Freq: Four times a day (QID) | ORAL | 0 refills | Status: DC | PRN

## 2017-12-06 NOTE — Telephone Encounter (Signed)
Eprescribed.

## 2017-12-06 NOTE — Telephone Encounter (Signed)
Pt notified hydrocodone apap sent to [facility] [place]. Pt voiced understanding.

## 2017-12-25 ENCOUNTER — Encounter: Payer: Self-pay | Admitting: Family Medicine

## 2017-12-25 ENCOUNTER — Encounter (INDEPENDENT_AMBULATORY_CARE_PROVIDER_SITE_OTHER): Payer: Self-pay

## 2017-12-25 ENCOUNTER — Ambulatory Visit (INDEPENDENT_AMBULATORY_CARE_PROVIDER_SITE_OTHER): Payer: Medicare Other | Admitting: Family Medicine

## 2017-12-25 VITALS — BP 136/68 | HR 66 | Temp 98.2°F | Ht <= 58 in

## 2017-12-25 DIAGNOSIS — G8929 Other chronic pain: Secondary | ICD-10-CM | POA: Diagnosis not present

## 2017-12-25 DIAGNOSIS — M545 Low back pain: Secondary | ICD-10-CM

## 2017-12-25 DIAGNOSIS — M5441 Lumbago with sciatica, right side: Secondary | ICD-10-CM

## 2017-12-25 DIAGNOSIS — F411 Generalized anxiety disorder: Secondary | ICD-10-CM | POA: Diagnosis not present

## 2017-12-25 DIAGNOSIS — M5442 Lumbago with sciatica, left side: Secondary | ICD-10-CM

## 2017-12-25 DIAGNOSIS — E1149 Type 2 diabetes mellitus with other diabetic neurological complication: Secondary | ICD-10-CM

## 2017-12-25 MED ORDER — PREDNISONE 20 MG PO TABS: ORAL_TABLET | ORAL | 0 refills | Status: DC

## 2017-12-25 NOTE — Progress Notes (Signed)
BP 136/68 (BP Location: Left Arm, Patient Position: Sitting, Cuff Size: Normal)   Pulse 66   Temp 98.2 F (36.8 C) (Oral)   Ht 4\' 9"  (1.448 m)   SpO2 96%   BMI 32.24 kg/m    CC: back pain x3 days Subjective:    [name], female    DOB: [birth year], 84 y.o.   MRN: [id number]  HPI: [name] is a 84 y.o. female presenting on 12/25/2017 for Back Pain (C/o lower back pain. Says she has for "awhile". Worsened since 12/22/17 after sitting for on a hard surface for an extended period. Went to stand and felt sharp, shooting pain down posterior leg. Pt is having trouble walking. States she has mentioned to Dr. Darnell Level before.  Pt accompanied by her daughter-in-law, [name] )   Known chronic lower back pain from osteoarthritis T12-L4, chronic sacroiliitis with progressed SI ankylosis and lumbar scoliosis. Also has h/o mod-severe spinal stenosis. Prior course of PT and epidural steroid injections were not effective. Last visit we referred patient to PMR for further evaluation. Pt declined repeat PT or steroid injections. Pt declined sacroiliac injection as well.   3d h/o sudden flare of lower back pain that started after twisting motion while at work. Describes lower back pain with radiation down bilateral leg. Lower back pain did start getting worse last weekend. She did go to work Friday night - lots of standing.  Treating with ice packs, ibuprofen without much benefit.  No numbness/weakness of legs. No bowel/bladder accidents. No fevers.  Denies inciting trauma/injury.   Relevant past medical, surgical, family and social history reviewed and updated as indicated. Interim medical history since our last visit reviewed. Allergies and medications reviewed and updated. Outpatient Medications Prior to Visit  Medication Sig Dispense Refill  . acetaminophen (TYLENOL) 500 MG tablet Take 500 mg by mouth every 6 (six) hours as needed for mild pain.    Marland Kitchen ALPRAZolam (XANAX) 1 MG tablet  Take 0.5 mg by mouth 4 (four) times daily as needed for anxiety.     Marland Kitchen aspirin EC 81 MG tablet Take 81 mg by mouth daily.    Marland Kitchen DEXILANT 30 MG capsule TAKE 1 CAPSULE (30 MG TOTAL) BY MOUTH DAILY. 30 capsule 11  . diclofenac sodium (VOLTAREN) 1 % GEL Apply 1 application topically 3 (three) times daily. 1 Tube 1  . furosemide (LASIX) 20 MG tablet TAKE 0.5 TABLETS (10 MG TOTAL) BY MOUTH DAILY. 45 tablet 3  . glucosamine-chondroitin 500-400 MG tablet Take 1 tablet by mouth daily.     Marland Kitchen glucose blood (ONE TOUCH ULTRA TEST) test strip USE AS INSTRUCTED TO CHECK BLOOD SUGAR ONCE A DAY Dx code: E11.9 50 each 5  . HYDROcodone-acetaminophen (NORCO/VICODIN) 5-325 MG tablet Take 0.5-1 tablets by mouth every 6 (six) hours as needed for moderate pain. 60 tablet 0  . loperamide (IMODIUM) 2 MG capsule Take 2 mg by mouth 4 (four) times daily as needed for diarrhea or loose stools.    Marland Kitchen loratadine (CLARITIN) 10 MG tablet Take 10 mg by mouth daily as needed for allergies.    . metoprolol tartrate (LOPRESSOR) 25 MG tablet TAKE ONE AND HALF TABLETS (37.5 MG TOTAL) BY MOUTH 2 (TWO) TIMES DAILY. 270 tablet 0  . Multiple Vitamin (MULTIVITAMIN) tablet Take 1 tablet by mouth daily. Patient reports takes a new vitamin to replace her centrum as recommended by her eye doctor.    . nitroGLYCERIN (NITROSTAT) 0.4 MG SL tablet Place  1 tablet (0.4 mg total) under the tongue every 5 (five) minutes as needed for chest pain. 25 tablet 3  . nystatin-triamcinolone ointment (MYCOLOG) APPLY 1 APPLICATION TOPICALLY 2 (TWO) TIMES DAILY. AS NEEDED FOR ITCHING 60 g 1  . pioglitazone (ACTOS) 30 MG tablet Take 1 tablet (30 mg total) by mouth daily. 90 tablet 3  . pravastatin (PRAVACHOL) 20 MG tablet TAKE 1 TABLET BY MOUTH EVERY DAY 30 tablet 3  . Probiotic Product (ALIGN) 4 MG CAPS Take 1 capsule (4 mg total) by mouth daily.    . ramipril (ALTACE) 5 MG capsule TAKE ONE CAPSULE BY MOUTH EVERYDAY FOR BLOOD PRESSURE 90 capsule 1  . ranitidine  (ZANTAC) 150 MG tablet Take 1 tablet (150 mg total) by mouth at bedtime. 30 tablet 3  . traZODone (DESYREL) 150 MG tablet Take 150 mg by mouth at bedtime.     . triamcinolone cream (KENALOG) 0.1 % APPLY 1 APPLICATION TOPICALLY 2 (TWO) TIMES DAILY.  1  . vitamin B-12 (CYANOCOBALAMIN) 1000 MCG tablet Take 1,000 mcg by mouth daily.    . vitamin E 400 UNIT capsule Take 400 Units by mouth daily.     No facility-administered medications prior to visit.      Per HPI unless specifically indicated in ROS section below Review of Systems     Objective:    BP 136/68 (BP Location: Left Arm, Patient Position: Sitting, Cuff Size: Normal)   Pulse 66   Temp 98.2 F (36.8 C) (Oral)   Ht 4\' 9"  (1.448 m)   SpO2 96%   BMI 32.24 kg/m   Wt Readings from Last 3 Encounters:  10/30/17 149 lb (67.6 kg)  10/23/17 149 lb 8 oz (67.8 kg)  10/19/17 147 lb 12 oz (67 kg)    Physical Exam  Constitutional: She appears well-developed and well-nourished. No distress.  Exam largely done with patient seated in wheelchair Needed assistance to stand  Musculoskeletal: She exhibits no edema.  Midline lower spine tenderness at sacrum/coccyx Pain with seated SLR R>L No pain with int/ext rotation of hips at hip joint Limited ROM from stiffness  Neurological: She is alert.  4/5 strength hip flexors, 5/5 strength BLE otherwise Sensation intact  Skin: Skin is warm. No erythema.  No rash or gluteal erosions or erythema  Nursing note and vitals reviewed.  Lab Results  Component Value Date   HGBA1C 6.5 07/26/2017       Assessment & Plan:   Problem List Items Addressed This Visit    Type 2 diabetes mellitus with neurological manifestations, controlled ([place])    Aware to minimize carbs while on prednisone.       GAD (generalized anxiety disorder)    Deteriorated in setting of worsening back pain      Chronic lower back pain    Saw [facility], declined further eval/treatment. Steroid injections into spine have  previously not been effective.       Relevant Medications   predniSONE (DELTASONE) 20 MG tablet   Acute low back pain - Primary    Points to sacral area as point of maximal tenderness. No signs of sacral ulcer on skin exam. Pt states she cannot tolerate laying on her back, and has trouble standing - this limits ability to image spine. With sciatica endorsed, ?HNP as cause of pain. Not consistent with fracture as no trauma endorsed. Will Rx prednisone taper (hold NSAID while on prednisone), continue hydrocodone. Update with effect, if no improvement, will either return here or go  to [facility] for lumbar/sacral films. Pt and DIL agree with plan.       Relevant Medications   predniSONE (DELTASONE) 20 MG tablet       Meds ordered this encounter  Medications  . predniSONE (DELTASONE) 20 MG tablet    Sig: Take two tablets daily for 3 days followed by one tablet daily for 4 days    Dispense:  10 tablet    Refill:  0   No orders of the defined types were placed in this encounter.   Follow up plan: Return if symptoms worsen or fail to improve.  Ria Bush, MD

## 2017-12-25 NOTE — Patient Instructions (Addendum)
For back pain - treat with steroid course.  If no better, we will refer you to [facility] for xray.  Continue hydrocodone.  Careful with carbs when you're taking prednisone

## 2017-12-26 DIAGNOSIS — M545 Low back pain, unspecified: Secondary | ICD-10-CM | POA: Insufficient documentation

## 2017-12-26 NOTE — Assessment & Plan Note (Signed)
Deteriorated in setting of worsening back pain

## 2017-12-26 NOTE — Assessment & Plan Note (Addendum)
Points to sacral area as point of maximal tenderness. No signs of sacral ulcer on skin exam. Pt states she cannot tolerate laying on her back, and has trouble standing - this limits ability to image spine. With sciatica endorsed, ?HNP as cause of pain. Not consistent with fracture as no trauma endorsed. Will Rx prednisone taper (hold NSAID while on prednisone), continue hydrocodone. Update with effect, if no improvement, will either return here or go to [facility] for lumbar/sacral films. Pt and DIL agree with plan.

## 2017-12-26 NOTE — Assessment & Plan Note (Signed)
Aware to minimize carbs while on prednisone.

## 2017-12-26 NOTE — Assessment & Plan Note (Signed)
Saw [facility], declined further eval/treatment. Steroid injections into spine have previously not been effective.

## 2017-12-28 ENCOUNTER — Ambulatory Visit: Payer: Self-pay

## 2017-12-28 NOTE — Telephone Encounter (Signed)
Pt last seen 12/25/17.Please advise.

## 2017-12-28 NOTE — Telephone Encounter (Signed)
Pt. Reports saw her provider Monday and started Prednisone. Has had side effects that she "just can't tolerate.What dies Dr. Danise Mina want me to do?" Answer Assessment - Initial Assessment Questions 1. SYMPTOMS: "Do you have any symptoms?"    Sweating,jittery, BS 219, a Heady short of breath, not sleeping well. 2. SEVERITY: If symptoms are present, ask "Are they mild, moderate or severe?"     Moderate - "I really can't stand these side effects." "What does Dr. Danise Mina want me to do?"  Protocols used: MEDICATION QUESTION CALL-A-AH

## 2017-12-28 NOTE — Telephone Encounter (Signed)
Recommend stop prednisone.  Has the steroid course helped her back and leg pain at all? How is she doing from that standpoint?

## 2017-12-28 NOTE — Telephone Encounter (Signed)
Pt returning call and pt was advised to stop taking prednisone per notes of Dr. Danise Mina on 12/28/17. Pt states "something must have helped, I am able to shuffle around and walk just a [place] bit". Pt states she still has some pain but will take Tylenol to see if it will help pain decrease.

## 2017-12-28 NOTE — Telephone Encounter (Signed)
Noted. Fwd to Dr. Darnell Level.

## 2017-12-29 ENCOUNTER — Encounter: Payer: Self-pay | Admitting: Family Medicine

## 2017-12-29 ENCOUNTER — Ambulatory Visit (INDEPENDENT_AMBULATORY_CARE_PROVIDER_SITE_OTHER): Payer: Medicare Other | Admitting: Family Medicine

## 2017-12-29 VITALS — BP 136/70 | HR 71 | Temp 98.0°F | Ht <= 58 in | Wt 147.2 lb

## 2017-12-29 DIAGNOSIS — M5441 Lumbago with sciatica, right side: Secondary | ICD-10-CM | POA: Diagnosis not present

## 2017-12-29 DIAGNOSIS — M5442 Lumbago with sciatica, left side: Secondary | ICD-10-CM

## 2017-12-29 NOTE — Progress Notes (Signed)
BP 136/70 (BP Location: Left Arm, Patient Position: Sitting, Cuff Size: Normal)   Pulse 71   Temp 98 F (36.7 C) (Oral)   Ht 4\' 9"  (1.448 m)   Wt 147 lb 4 oz (66.8 kg)   SpO2 96%   BMI 31.86 kg/m    CC: back pain Subjective:    [name], female    DOB: [birth year], 84 y.o.   MRN: [id number]  HPI: [name] is a 84 y.o. female presenting on 12/29/2017 for Back Pain (States the back pain is better. Pt is walking with walker today. Pt accompanied by her daughter-in-law, Jenny Reichmann.)   See prior note for details. Last visit we treated presumed back strain with prednisone course - however she had bad side effects to prednisone - restless, increased appetite, shortness of breath, nausea, and increased sugars - advised to stop this but she decided to finish course - has 1 dose left.   Since taking prednisone, is better able to ambulate, still with some pain however. Describes lower back pain predominantly right sided, worse with standing.   Relevant past medical, surgical, family and social history reviewed and updated as indicated. Interim medical history since our last visit reviewed. Allergies and medications reviewed and updated. Outpatient Medications Prior to Visit  Medication Sig Dispense Refill  . acetaminophen (TYLENOL) 500 MG tablet Take 500 mg by mouth every 6 (six) hours as needed for mild pain.    Marland Kitchen ALPRAZolam (XANAX) 1 MG tablet Take 0.5 mg by mouth 4 (four) times daily as needed for anxiety.     Marland Kitchen aspirin EC 81 MG tablet Take 81 mg by mouth daily.    Marland Kitchen DEXILANT 30 MG capsule TAKE 1 CAPSULE (30 MG TOTAL) BY MOUTH DAILY. 30 capsule 11  . diclofenac sodium (VOLTAREN) 1 % GEL Apply 1 application topically 3 (three) times daily. 1 Tube 1  . furosemide (LASIX) 20 MG tablet TAKE 0.5 TABLETS (10 MG TOTAL) BY MOUTH DAILY. 45 tablet 3  . glucosamine-chondroitin 500-400 MG tablet Take 1 tablet by mouth daily.     Marland Kitchen glucose blood (ONE TOUCH ULTRA TEST) test strip  USE AS INSTRUCTED TO CHECK BLOOD SUGAR ONCE A DAY Dx code: E11.9 50 each 5  . HYDROcodone-acetaminophen (NORCO/VICODIN) 5-325 MG tablet Take 0.5-1 tablets by mouth every 6 (six) hours as needed for moderate pain. 60 tablet 0  . loperamide (IMODIUM) 2 MG capsule Take 2 mg by mouth 4 (four) times daily as needed for diarrhea or loose stools.    Marland Kitchen loratadine (CLARITIN) 10 MG tablet Take 10 mg by mouth daily as needed for allergies.    . metoprolol tartrate (LOPRESSOR) 25 MG tablet TAKE ONE AND HALF TABLETS (37.5 MG TOTAL) BY MOUTH 2 (TWO) TIMES DAILY. 270 tablet 0  . Multiple Vitamin (MULTIVITAMIN) tablet Take 1 tablet by mouth daily. Patient reports takes a new vitamin to replace her centrum as recommended by her eye doctor.    . nitroGLYCERIN (NITROSTAT) 0.4 MG SL tablet Place 1 tablet (0.4 mg total) under the tongue every 5 (five) minutes as needed for chest pain. 25 tablet 3  . nystatin-triamcinolone ointment (MYCOLOG) APPLY 1 APPLICATION TOPICALLY 2 (TWO) TIMES DAILY. AS NEEDED FOR ITCHING 60 g 1  . pioglitazone (ACTOS) 30 MG tablet Take 1 tablet (30 mg total) by mouth daily. 90 tablet 3  . pravastatin (PRAVACHOL) 20 MG tablet TAKE 1 TABLET BY MOUTH EVERY DAY 30 tablet 3  . predniSONE (DELTASONE) 20  MG tablet Take two tablets daily for 3 days followed by one tablet daily for 4 days 10 tablet 0  . Probiotic Product (ALIGN) 4 MG CAPS Take 1 capsule (4 mg total) by mouth daily.    . ramipril (ALTACE) 5 MG capsule TAKE ONE CAPSULE BY MOUTH EVERYDAY FOR BLOOD PRESSURE 90 capsule 1  . ranitidine (ZANTAC) 150 MG tablet Take 1 tablet (150 mg total) by mouth at bedtime. 30 tablet 3  . traZODone (DESYREL) 150 MG tablet Take 150 mg by mouth at bedtime.     . triamcinolone cream (KENALOG) 0.1 % APPLY 1 APPLICATION TOPICALLY 2 (TWO) TIMES DAILY.  1  . vitamin B-12 (CYANOCOBALAMIN) 1000 MCG tablet Take 1,000 mcg by mouth daily.    . vitamin E 400 UNIT capsule Take 400 Units by mouth daily.     No  facility-administered medications prior to visit.      Per HPI unless specifically indicated in ROS section below Review of Systems     Objective:    BP 136/70 (BP Location: Left Arm, Patient Position: Sitting, Cuff Size: Normal)   Pulse 71   Temp 98 F (36.7 C) (Oral)   Ht 4\' 9"  (1.448 m)   Wt 147 lb 4 oz (66.8 kg)   SpO2 96%   BMI 31.86 kg/m   Wt Readings from Last 3 Encounters:  12/29/17 147 lb 4 oz (66.8 kg)  10/30/17 149 lb (67.6 kg)  10/23/17 149 lb 8 oz (67.8 kg)    Physical Exam  Constitutional: She appears well-developed and well-nourished. No distress.  Stands with support of walker  Musculoskeletal: Normal range of motion. She exhibits no edema.       Back:  Mild midline lower lumbar spine tenderness into sacrum, point tender just R lateral of midline at lower lumbar paraspinous mm into R SIJ No pain at sciatic notch or at GTB bilaterally  Nursing note and vitals reviewed.  Lab Results  Component Value Date   CREATININE 0.87 07/26/2017   BUN 26 (H) 07/26/2017   NA 137 07/26/2017   K 4.3 07/26/2017   CL 102 07/26/2017   CO2 32 07/26/2017       Assessment & Plan:   Problem List Items Addressed This Visit    Acute right-sided low back pain - Primary    Anticipate bad lumbar muscle strain/spasm in association with HNP. Pain seems to be improving after prednisone, however she has had significant difficulty with side effects - recommend stop prednisone.  Return to scheduled hydrocodone, add tylenol 500 with meals, and sparing ibuprofen. Discussed heating pad use and gentle stretching of lower back.           No orders of the defined types were placed in this encounter.  No orders of the defined types were placed in this encounter.   Follow up plan: No follow-ups on file.  Ria Bush, MD

## 2017-12-29 NOTE — Patient Instructions (Addendum)
I think you had bad muscle spasm of right lower back or possibly pinched nerve - stop prednisone. May take advil 200-400mg  with meals as needed, continue tylenol, continue hydrocodone pain medicine.  Try heating pad to back.  Gentle stretching of the lower back.

## 2017-12-30 NOTE — Assessment & Plan Note (Addendum)
Anticipate bad lumbar muscle strain/spasm in association with HNP. Pain seems to be improving after prednisone, however she has had significant difficulty with side effects - recommend stop prednisone.  Return to scheduled hydrocodone, add tylenol 500 with meals, and sparing ibuprofen. Discussed heating pad use and gentle stretching of lower back.

## 2018-01-03 ENCOUNTER — Telehealth: Payer: Self-pay

## 2018-01-03 ENCOUNTER — Ambulatory Visit (INDEPENDENT_AMBULATORY_CARE_PROVIDER_SITE_OTHER)
Admission: RE | Admit: 2018-01-03 | Discharge: 2018-01-03 | Disposition: A | Discharge status: Home / Self Care | Payer: Medicare Other | Source: Ambulatory Visit | Attending: Family Medicine | Admitting: Family Medicine

## 2018-01-03 DIAGNOSIS — M545 Low back pain: Secondary | ICD-10-CM | POA: Diagnosis not present

## 2018-01-03 DIAGNOSIS — M5441 Lumbago with sciatica, right side: Secondary | ICD-10-CM | POA: Diagnosis not present

## 2018-01-03 NOTE — Telephone Encounter (Signed)
plz have her come in for xray if she feels she can lay on exam table. Xray ordered.

## 2018-01-03 NOTE — Telephone Encounter (Signed)
Copied from [place] [id number]. Topic: General - Other >> Jan 03, 2018  8:14 AM Keene Breath wrote: Reason for CRM: Patient called to speak with the doctor or nurse because she states that she cannot walk since yesterday and is in a lot of pain.  She did not want a NT and insisted on speaking to the doctor because there was no schedule available today.  Please advise.  CB# [phone] Cell or [phone].

## 2018-01-03 NOTE — Telephone Encounter (Signed)
I spoke with pt; pt last seen 12/29/17; pt has been using heating pad and taking Advil, tylenol and hydrocodone and nothing seems to help the pain; since 01/02/18 pt said she cannot walk due to pain in lower back radiating down rt let. There is no numbness and pt can move legs but hurts too bad to try weight bearing. Pain level now is 8. Pt wants to know what else can be done. Does not want to schedule appt but request cb. [facility]

## 2018-01-03 NOTE — Telephone Encounter (Signed)
Spoke with pt relaying Dr. Synthia Innocent message. Says she will be here about 1:00 PM.

## 2018-01-04 ENCOUNTER — Telehealth: Payer: Self-pay

## 2018-01-04 ENCOUNTER — Other Ambulatory Visit: Payer: Self-pay | Admitting: Family Medicine

## 2018-01-04 MED ORDER — METHOCARBAMOL 500 MG PO TABS: 250.0000 mg | ORAL_TABLET | Freq: Three times a day (TID) | ORAL | 0 refills | Status: DC | PRN

## 2018-01-04 NOTE — Telephone Encounter (Signed)
Copied from [place] [id number]. Topic: Inquiry >> Jan 04, 2018  9:22 AM Berneta Levins wrote: Reason for CRM:   Pt calling to get results of x-rays done yesterday.  Pt can be reached at [phone] or [phone]. >> Jan 04, 2018  9:30 AM Modena Nunnery, CMA wrote: Imaging has been resulted but not annotated

## 2018-01-04 NOTE — Telephone Encounter (Signed)
Spoke with pt relaying results. [See xray results, 01/03/18.]

## 2018-01-08 ENCOUNTER — Ambulatory Visit (INDEPENDENT_AMBULATORY_CARE_PROVIDER_SITE_OTHER)
Admission: RE | Admit: 2018-01-08 | Discharge: 2018-01-08 | Disposition: A | Discharge status: Home / Self Care | Payer: Medicare Other | Source: Ambulatory Visit | Attending: Family Medicine | Admitting: Family Medicine

## 2018-01-08 ENCOUNTER — Encounter: Payer: Self-pay | Admitting: Family Medicine

## 2018-01-08 ENCOUNTER — Ambulatory Visit (INDEPENDENT_AMBULATORY_CARE_PROVIDER_SITE_OTHER): Payer: Medicare Other | Admitting: Family Medicine

## 2018-01-08 VITALS — BP 128/70 | HR 77 | Temp 98.6°F | Ht <= 58 in

## 2018-01-08 DIAGNOSIS — M5442 Lumbago with sciatica, left side: Secondary | ICD-10-CM | POA: Diagnosis not present

## 2018-01-08 DIAGNOSIS — M5441 Lumbago with sciatica, right side: Secondary | ICD-10-CM | POA: Diagnosis not present

## 2018-01-08 DIAGNOSIS — M4807 Spinal stenosis, lumbosacral region: Secondary | ICD-10-CM | POA: Diagnosis not present

## 2018-01-08 DIAGNOSIS — G8929 Other chronic pain: Secondary | ICD-10-CM

## 2018-01-08 DIAGNOSIS — M898X1 Other specified disorders of bone, shoulder: Secondary | ICD-10-CM | POA: Diagnosis not present

## 2018-01-08 DIAGNOSIS — M19012 Primary osteoarthritis, left shoulder: Secondary | ICD-10-CM | POA: Diagnosis not present

## 2018-01-08 MED ORDER — HYDROCODONE-ACETAMINOPHEN 5-325 MG PO TABS: 1.0000 | ORAL_TABLET | Freq: Three times a day (TID) | ORAL | 0 refills | Status: DC | PRN

## 2018-01-08 NOTE — Assessment & Plan Note (Signed)
Worse after someone helped her get up by grabbing onto L shoulder - check films r/o fracture.

## 2018-01-08 NOTE — Assessment & Plan Note (Addendum)
Chronic, but now acutely worse. Reviewed recent lumbar films (12/2017) - will check lumbar MRI eval for herniated disc or worsening stenosis as cause. Discussed possible return to PMR vs pain clinic pending results.

## 2018-01-08 NOTE — Patient Instructions (Addendum)
Xray of clavicle today.  See referral coordinator to schedule lumbar MRI.  Try whole hydrocodone tablet - three times a day.

## 2018-01-08 NOTE — Assessment & Plan Note (Addendum)
Acutely worse, associated with subjective weakness of legs, worse with standing.  This has led to decreased ambulation and struggling to care for herself at home, increasing fall risk as well. Given change in status, did recommend HH eval and pt and DIL agree.

## 2018-01-08 NOTE — Progress Notes (Signed)
BP 128/70 (BP Location: Left Arm, Patient Position: Sitting, Cuff Size: Normal)   Pulse 77   Temp 98.6 F (37 C) (Oral)   Ht 4\' 9"  (1.448 m)   SpO2 96%   BMI 31.86 kg/m    CC: severe back pain Subjective:    [name], female    DOB: 1927-11-07, 84 y.o.   MRN: [id number]  HPI: JATAVIA KELTNER is a 84 y.o. female presenting on 01/08/2018 for Back Pain (C/o severe lower back pain, not improving. States it has been 7 wks. Not able to use walker anymore. Pt states today she could not move left leg. Per daughter-in-law, pt is having to have more assistance with daily activities (ie, bathroom, shower, etc.). Pt accompanied by her daughter-in-law, Jenny Reichmann. )   In wheelchair today.   See prior notes for details. Several week history of acute flare of lower back pain that started after twisting motion while at work. Lumbar films last week did not show acute change to her chronic back pain. No improvement with ice pack, tylenol, ibuprofen, robaxin, or regular narcotic regimen. Prednisone taper helped but she stopped early due to difficulty tolerating steroid. R leg feels numb, L leg feels weak.  Denies numbness legs, bowel/bladder accidents, fevers/chills.   Had a fall Friday night while using walker to get to bathroom - slipped down to floor. Difficult situation at home - lives with 2 other elderly, increasing trouble with ambulation. High fall risk. Now only using wheelchair, no longer using walker due to increase in pain. She agrees to home health evaluation.  Known chronic lower back pain from osteoarthritis T12-L4, chronic sacroiliitis with progressed SI ankylosis and lumbar scoliosis. Also has h/o mod-severe spinal stenosis. Prior course of PT and epidural steroid injections were not effective. We referred patient this year (09/2017) to PMR for further evaluation. Pt declined repeat PT or steroid injections. Pt declined sacroiliac injection as well.   Relevant past medical,  surgical, family and social history reviewed and updated as indicated. Interim medical history since our last visit reviewed. Allergies and medications reviewed and updated. Outpatient Medications Prior to Visit  Medication Sig Dispense Refill  . acetaminophen (TYLENOL) 500 MG tablet Take 500 mg by mouth every 6 (six) hours as needed for mild pain.    Marland Kitchen ALPRAZolam (XANAX) 1 MG tablet Take 0.5 mg by mouth 4 (four) times daily as needed for anxiety.     Marland Kitchen aspirin EC 81 MG tablet Take 81 mg by mouth daily.    Marland Kitchen DEXILANT 30 MG capsule TAKE 1 CAPSULE (30 MG TOTAL) BY MOUTH DAILY. 30 capsule 11  . diclofenac sodium (VOLTAREN) 1 % GEL Apply 1 application topically 3 (three) times daily. 1 Tube 1  . furosemide (LASIX) 20 MG tablet TAKE 0.5 TABLETS (10 MG TOTAL) BY MOUTH DAILY. 45 tablet 3  . glucosamine-chondroitin 500-400 MG tablet Take 1 tablet by mouth daily.     Marland Kitchen glucose blood (ONE TOUCH ULTRA TEST) test strip USE AS INSTRUCTED TO CHECK BLOOD SUGAR ONCE A DAY Dx code: E11.9 50 each 5  . loperamide (IMODIUM) 2 MG capsule Take 2 mg by mouth 4 (four) times daily as needed for diarrhea or loose stools.    Marland Kitchen loratadine (CLARITIN) 10 MG tablet Take 10 mg by mouth daily as needed for allergies.    . methocarbamol (ROBAXIN) 500 MG tablet Take 0.5-1 tablets (250-500 mg total) by mouth 3 (three) times daily as needed for muscle spasms. [place]  tablet 0  . metoprolol tartrate (LOPRESSOR) 25 MG tablet TAKE ONE AND HALF TABLETS (37.5 MG TOTAL) BY MOUTH 2 (TWO) TIMES DAILY. 270 tablet 0  . Multiple Vitamin (MULTIVITAMIN) tablet Take 1 tablet by mouth daily. Patient reports takes a new vitamin to replace her centrum as recommended by her eye doctor.    . nitroGLYCERIN (NITROSTAT) 0.4 MG SL tablet Place 1 tablet (0.4 mg total) under the tongue every 5 (five) minutes as needed for chest pain. 25 tablet 3  . nystatin-triamcinolone ointment (MYCOLOG) APPLY 1 APPLICATION TOPICALLY 2 (TWO) TIMES DAILY. AS NEEDED FOR ITCHING 60  g 1  . pioglitazone (ACTOS) 30 MG tablet Take 1 tablet (30 mg total) by mouth daily. 90 tablet 3  . pravastatin (PRAVACHOL) 20 MG tablet TAKE 1 TABLET BY MOUTH EVERY DAY 30 tablet 3  . predniSONE (DELTASONE) 20 MG tablet Take two tablets daily for 3 days followed by one tablet daily for 4 days 10 tablet 0  . Probiotic Product (ALIGN) 4 MG CAPS Take 1 capsule (4 mg total) by mouth daily.    . ramipril (ALTACE) 5 MG capsule TAKE ONE CAPSULE BY MOUTH EVERYDAY FOR BLOOD PRESSURE 90 capsule 1  . ranitidine (ZANTAC) 150 MG tablet Take 1 tablet (150 mg total) by mouth at bedtime. 30 tablet 3  . traZODone (DESYREL) 150 MG tablet Take 150 mg by mouth at bedtime.     . triamcinolone cream (KENALOG) 0.1 % APPLY 1 APPLICATION TOPICALLY 2 (TWO) TIMES DAILY.  1  . vitamin B-12 (CYANOCOBALAMIN) 1000 MCG tablet Take 1,000 mcg by mouth daily.    . vitamin E 400 UNIT capsule Take 400 Units by mouth daily.    Marland Kitchen HYDROcodone-acetaminophen (NORCO/VICODIN) 5-325 MG tablet Take 0.5-1 tablets by mouth every 6 (six) hours as needed for moderate pain. 60 tablet 0   No facility-administered medications prior to visit.      Per HPI unless specifically indicated in ROS section below Review of Systems     Objective:    BP 128/70 (BP Location: Left Arm, Patient Position: Sitting, Cuff Size: Normal)   Pulse 77   Temp 98.6 F (37 C) (Oral)   Ht 4\' 9"  (1.448 m)   SpO2 96%   BMI 31.86 kg/m   Wt Readings from Last 3 Encounters:  12/29/17 147 lb 4 oz (66.8 kg)  10/30/17 149 lb (67.6 kg)  10/23/17 149 lb 8 oz (67.8 kg)    Physical Exam  Constitutional: She appears well-developed and well-nourished. No distress.  Pulmonary/Chest:  Tender to palpation of medial L clavicle without deformity appreciated  Musculoskeletal: Normal range of motion. She exhibits no edema.  Tender to palpation R lower back near sacrum No midline spine tenderness  Neurological: She is alert.  Sensation intact BLE  BLE strength overall  intact but some decrease noted due to pain  Pain with seated straight leg raise bilaterally   Skin: Skin is warm and dry.  Psychiatric: She has a normal mood and affect.  Nursing note and vitals reviewed.  DG Lumbar Spine Complete CLINICAL DATA:  Acute right-sided back pain with right-sided sciatica  EXAM: LUMBAR SPINE - COMPLETE 4+ VIEW  COMPARISON:  08/15/2017  FINDINGS: Image quality degraded by motion and suboptimal technique.  Lumbar scoliosis unchanged. Retrolisthesis L2-3 is unchanged. Multilevel disc degeneration and spurring throughout the lumbar spine most prominent at L1-2 and L2-3. Negative for fracture  Atherosclerotic aorta.  Bilateral chronic sacroiliitis  IMPRESSION: Scoliosis and degenerative change. No acute abnormality  and no change from the prior study  Electronically Signed   By: Franchot Gallo M.D.   On: 01/03/2018 16:22   MRI LUMBAR SPINE WITHOUT CONTRAST  IMPRESSION: 1. Moderately severe spinal stenosis at L4-5 primarily due to posterior element hypertrophy. 2.  Moderately severe bilateral facet arthritis at L5-L1. 3. 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 Original Report Authenticated By: Lorriane Shire, M.D.    Assessment & Plan:   Problem List Items Addressed This Visit    Spinal stenosis of lumbosacral region   Relevant Orders   MR Lumbar Spine Wo Contrast   Ambulatory referral to Home Health   Pain of left clavicle    Worse after someone helped her get up by grabbing onto L shoulder - check films r/o fracture.       Relevant Orders   DG Clavicle Left   Encounter for chronic pain management    [place] CSRS reviewed.  Recent increase in pain - will increase hydrocodone to 3 a day for the next month. Currently working up increased pain.      Chronic lower back pain    Chronic, but now acutely worse. Reviewed recent lumbar films (12/2017) - will check lumbar MRI eval for herniated disc or worsening stenosis as cause.  Discussed possible return to PMR vs pain clinic pending results.       Relevant Medications   HYDROcodone-acetaminophen (NORCO/VICODIN) 5-325 MG tablet   Other Relevant Orders   MR Lumbar Spine Wo Contrast   Ambulatory referral to [place]   Acute right-sided low back pain - Primary    Acutely worse, associated with subjective weakness of legs, worse with standing.  This has led to decreased ambulation and struggling to care for herself at home, increasing fall risk as well. Given change in status, did recommend HH eval and pt and DIL agree.       Relevant Medications   HYDROcodone-acetaminophen (NORCO/VICODIN) 5-325 MG tablet   Other Relevant Orders   MR Lumbar Spine Wo Contrast   Ambulatory referral to [place] ordered this encounter  Medications  . HYDROcodone-acetaminophen (NORCO/VICODIN) 5-325 MG tablet    Sig: Take 1 tablet by mouth 3 (three) times daily as needed for moderate pain.    Dispense:  90 tablet    Refill:  0   Orders Placed This Encounter  Procedures  . DG Clavicle Left    Standing Status:   Future    Number of Occurrences:   1    Standing Expiration Date:   03/11/2019    Order Specific Question:   Reason for Exam (SYMPTOM  OR DIAGNOSIS REQUIRED)    Answer:   L clavicle pain after fall    Order Specific Question:   Preferred imaging location?    Answer:   [facility]    Order Specific Question:   Radiology Contrast Protocol - do NOT remove file path    Answer:   \\[email or web address]  . MR Lumbar Spine Wo Contrast    Standing Status:   Future    Standing Expiration Date:   03/11/2019    Order Specific Question:   ** REASON FOR EXAM (FREE TEXT)    Answer:   progressive lower back pain    Order Specific Question:   What is the patient's sedation requirement?    Answer:   No Sedation    Order Specific Question:   Does the patient have a pacemaker or  implanted devices?    Answer:   No    Order  Specific Question:   Preferred imaging location?    Answer:   [facility] (table limit-300lbs)    Order Specific Question:   Radiology Contrast Protocol - do NOT remove file path    Answer:   \\[email or web address]  . Ambulatory referral to Home Health    Referral Priority:   Routine    Referral Type:   Home Health Care    Referral Reason:   Specialty Services Required    Requested Specialty:   [place]    Number of Visits Requested:   1    Follow up plan: No follow-ups on file.  Ria Bush, MD

## 2018-01-08 NOTE — Assessment & Plan Note (Signed)
[place] CSRS reviewed.  Recent increase in pain - will increase hydrocodone to 3 a day for the next month. Currently working up increased pain.

## 2018-01-09 ENCOUNTER — Other Ambulatory Visit: Payer: Self-pay | Admitting: Gynecology

## 2018-01-09 ENCOUNTER — Ambulatory Visit
Admission: RE | Admit: 2018-01-09 | Discharge: 2018-01-09 | Disposition: A | Discharge status: Home / Self Care | Payer: Medicare Other | Source: Ambulatory Visit | Attending: Family Medicine | Admitting: Family Medicine

## 2018-01-09 DIAGNOSIS — M5442 Lumbago with sciatica, left side: Secondary | ICD-10-CM | POA: Insufficient documentation

## 2018-01-09 DIAGNOSIS — M48061 Spinal stenosis, lumbar region without neurogenic claudication: Secondary | ICD-10-CM | POA: Diagnosis not present

## 2018-01-09 DIAGNOSIS — M4807 Spinal stenosis, lumbosacral region: Secondary | ICD-10-CM | POA: Insufficient documentation

## 2018-01-09 DIAGNOSIS — G8929 Other chronic pain: Secondary | ICD-10-CM | POA: Diagnosis not present

## 2018-01-09 DIAGNOSIS — M5441 Lumbago with sciatica, right side: Secondary | ICD-10-CM | POA: Insufficient documentation

## 2018-01-09 DIAGNOSIS — M545 Low back pain: Secondary | ICD-10-CM | POA: Diagnosis not present

## 2018-01-12 ENCOUNTER — Telehealth: Payer: Self-pay | Admitting: Family Medicine

## 2018-01-12 DIAGNOSIS — M4807 Spinal stenosis, lumbosacral region: Secondary | ICD-10-CM

## 2018-01-12 DIAGNOSIS — M5441 Lumbago with sciatica, right side: Secondary | ICD-10-CM

## 2018-01-12 NOTE — Telephone Encounter (Signed)
Copied from [place] [id number]. Topic: Quick Communication - See Telephone Encounter >> Jan 12, 2018 12:37 PM Waldemar Dickens, Valda Favia wrote: Patient is calling in wanted to see Dr Danise Mina for her pain, advised her he doesn't have any openings and offered her a evening appt with Webb Silversmith she states she doesn't want to see anyone else she would like Dr Darnell Level to work her in. Patient is requesting a call back.  CB# [phone]

## 2018-01-12 NOTE — Telephone Encounter (Signed)
I spoke with pt and pts shoulder is not hurting it is the collarbone that is hurting.If raises her arm straight out at the shoulder there is no pain but if touches her head with  Her hand pt has pain in collarbone. Now pain level in collarbone is 4. Pt having pain level of 10 when pt tries to stand, walk, or go to bed. Pt having to shuffle her feet to walk. Pt is taking hydrocodone apap 5-325 mg tid with minimal relief of back and collarbone pain.  Pt is aware of getting a referral to back dr and will wait for cb on that appt. [facility]. Pt request cb after reviewed by Dr Darnell Level.

## 2018-01-12 NOTE — Telephone Encounter (Signed)
Spoke to pt and advised of instruction per Dr Darnell Level. Pt states she is "scared of medicine" and "only gonna take one pill (hydrocodone.) She states she'd rather suffer and is "not going against what he's saying" but she does not feel comfortable with increased dose. She has been advised that she has been referred to [facility] and to await a call with appt details

## 2018-01-12 NOTE — Telephone Encounter (Signed)
I'm sorry she's hurting. I recommend she try 1.5-2 hydrocodone 3/325mg  at a time to see if any better pain relief. May take 1.5-2 tablets TID over weekend for pain relief.  Don't take tylenol with this as there is already tylenol in her hydrocodone pills. rec OTC stool softener, miralax, and/or increased fiber to avoid constipation.  Other option is another steroid course, but she did not tolerate it the first time so hesitant to re prescribe.  Will try and get her into back doctor to dicuss other pain relief options. May try and get her back to see [facility].

## 2018-01-15 ENCOUNTER — Ambulatory Visit (INDEPENDENT_AMBULATORY_CARE_PROVIDER_SITE_OTHER): Payer: Medicare Other | Admitting: Endocrinology

## 2018-01-15 ENCOUNTER — Telehealth: Payer: Self-pay | Admitting: Endocrinology

## 2018-01-15 ENCOUNTER — Encounter: Payer: Self-pay | Admitting: Endocrinology

## 2018-01-15 VITALS — BP 110/60 | HR 78 | Ht <= 58 in | Wt 147.0 lb

## 2018-01-15 DIAGNOSIS — E1165 Type 2 diabetes mellitus with hyperglycemia: Secondary | ICD-10-CM

## 2018-01-15 DIAGNOSIS — R5383 Other fatigue: Secondary | ICD-10-CM

## 2018-01-15 DIAGNOSIS — Z23 Encounter for immunization: Secondary | ICD-10-CM

## 2018-01-15 LAB — CBC WITH DIFFERENTIAL/PLATELET
BASOS ABS: 0.1 10*3/uL (ref 0.0–0.1)
Basophils Relative: 0.9 % (ref 0.0–3.0)
EOS PCT: 1.7 % (ref 0.0–5.0)
Eosinophils Absolute: 0.1 10*3/uL (ref 0.0–0.7)
HEMATOCRIT: 39.6 % (ref 36.0–46.0)
HEMOGLOBIN: 12.9 g/dL (ref 12.0–15.0)
LYMPHS ABS: 1.2 10*3/uL (ref 0.7–4.0)
LYMPHS PCT: 13.7 % (ref 12.0–46.0)
MCHC: 32.6 g/dL (ref 30.0–36.0)
MCV: 92.2 fl (ref 78.0–100.0)
MONOS PCT: 8.7 % (ref 3.0–12.0)
Monocytes Absolute: 0.8 10*3/uL (ref 0.1–1.0)
NEUTROS PCT: 75 % (ref 43.0–77.0)
Neutro Abs: 6.7 10*3/uL (ref 1.4–7.7)
Platelets: 283 10*3/uL (ref 150.0–400.0)
RBC: 4.29 Mil/uL (ref 3.87–5.11)
RDW: 13.9 % (ref 11.5–15.5)
WBC: 8.9 10*3/uL (ref 4.0–10.5)

## 2018-01-15 LAB — POCT GLYCOSYLATED HEMOGLOBIN (HGB A1C): Hemoglobin A1C: 7 % — AB (ref 4.0–5.6)

## 2018-01-15 MED ORDER — SITAGLIPTIN PHOSPHATE 100 MG PO TABS: 100.0000 mg | ORAL_TABLET | Freq: Every day | ORAL | 1 refills | Status: DC

## 2018-01-15 NOTE — Progress Notes (Signed)
[name], female   DOB: 1927-08-23, 84 y.o.   MRN: [id number]   Reason for Appointment: Diabetes follow-up   History of Present Illness   Diagnosis: Type 2 DIABETES MELITUS, date of diagnosis: 1991   She has had mild diabetes for several years and has been on Actos only since about 2001 Her A1c has generally been around or under 7% She was given a trial of metformin because of her difficulty with weight loss and also edema but she felt that it made her blood sugars go up and she will not try it again Also she thinks her blood sugars were higher with a trial of Januvia  RECENT history: A1c has been consistently below 7% and now higher at 7  She is currently taking Actos 30 mg daily since 01/2014  Current blood sugar patterns, day-to-day management and problems identified:   She was accompanied by her daughter-in-law and she did bring her meter at this time  She was apparently given prednisone up to 40 mg last month or a rash and this made her blood sugar go up significantly with readings as high as 350  Her blood sugar however continue to be significantly high including a reading of 282 yesterday  Some of her high readings are after eating cereal which is usually in the morning and she checks her sugars mostly midday and afternoon  Also in the evening her blood sugars appear to be higher, see below  Recently she is having significant amount of back pain which is not controlled and still getting evaluation for this  Does not think that she is going off her diet otherwise or drinking regular drinks with sugar     Oral hypoglycemic drugs: Actos 30 mg daily        Side effects from medications: None  Monitors blood glucose: ?  Frequency .    Glucometer: One Touch.           Blood Glucose readings at home range from 72 up to 350 with median 197 Highest readings are in the late afternoons and evenings and lowest readings midafternoon, usually not  checking fastings  Hypoglycemia frequency: Never.           Meals: 2  meals per day usually Physical activity:  minimal            Dietician visit: Most recent: Several years ago             Wt Readings from Last 3 Encounters:  01/15/18 147 lb (66.7 kg)  01/09/18 147 lb (66.7 kg)  12/29/17 147 lb 4 oz (66.8 kg)   Lab Results  Component Value Date   HGBA1C 7.0 (A) 01/15/2018   HGBA1C 6.5 07/26/2017   HGBA1C 6.3 01/25/2017   Lab Results  Component Value Date   MICROALBUR <0.7 07/26/2017   LDLCALC 56 12/30/2015   CREATININE 0.87 07/26/2017     Allergies as of 01/15/2018      Reactions   Metformin And Related Diarrhea      Medication List        Accurate as of 01/15/18 11:59 PM. Always use your most recent med list.          acetaminophen 500 MG tablet Commonly known as:  TYLENOL Take 500 mg by mouth every 6 (six) hours as needed for mild pain.   ALIGN 4 MG Caps Take 1 capsule (4 mg total) by mouth daily.   ALPRAZolam 1 MG  tablet Commonly known as:  XANAX Take 0.5 mg by mouth 4 (four) times daily as needed for anxiety.   aspirin EC 81 MG tablet Take 81 mg by mouth daily.   DEXILANT 30 MG capsule Generic drug:  Dexlansoprazole TAKE 1 CAPSULE (30 MG TOTAL) BY MOUTH DAILY.   diclofenac sodium 1 % Gel Commonly known as:  VOLTAREN Apply 1 application topically 3 (three) times daily.   furosemide 20 MG tablet Commonly known as:  LASIX TAKE 0.5 TABLETS (10 MG TOTAL) BY MOUTH DAILY.   glucosamine-chondroitin 500-400 MG tablet Take 1 tablet by mouth daily.   glucose blood test strip USE AS INSTRUCTED TO CHECK BLOOD SUGAR ONCE A DAY Dx code: E11.9   HYDROcodone-acetaminophen 5-325 MG tablet Commonly known as:  NORCO/VICODIN Take 1 tablet by mouth 3 (three) times daily as needed for moderate pain.   loperamide 2 MG capsule Commonly known as:  IMODIUM Take 2 mg by mouth 4 (four) times daily as needed for diarrhea or loose stools.   loratadine 10 MG  tablet Commonly known as:  CLARITIN Take 10 mg by mouth daily as needed for allergies.   methocarbamol 500 MG tablet Commonly known as:  ROBAXIN Take 0.5-1 tablets (250-500 mg total) by mouth 3 (three) times daily as needed for muscle spasms.   metoprolol tartrate 25 MG tablet Commonly known as:  LOPRESSOR TAKE ONE AND HALF TABLETS (37.5 MG TOTAL) BY MOUTH 2 (TWO) TIMES DAILY.   multivitamin tablet Take 1 tablet by mouth daily. Patient reports takes a new vitamin to replace her centrum as recommended by her eye doctor.   nitroGLYCERIN 0.4 MG SL tablet Commonly known as:  NITROSTAT Place 1 tablet (0.4 mg total) under the tongue every 5 (five) minutes as needed for chest pain.   nystatin-triamcinolone ointment Commonly known as:  MYCOLOG APPLY 1 APPLICATION TOPICALLY 2 (TWO) TIMES DAILY. AS NEEDED FOR ITCHING   pioglitazone 30 MG tablet Commonly known as:  ACTOS Take 1 tablet (30 mg total) by mouth daily.   pravastatin 20 MG tablet Commonly known as:  PRAVACHOL TAKE 1 TABLET BY MOUTH EVERY DAY   predniSONE 20 MG tablet Commonly known as:  DELTASONE Take two tablets daily for 3 days followed by one tablet daily for 4 days   ramipril 5 MG capsule Commonly known as:  ALTACE TAKE ONE CAPSULE BY MOUTH EVERYDAY FOR BLOOD PRESSURE   ranitidine 150 MG tablet Commonly known as:  ZANTAC Take 1 tablet (150 mg total) by mouth at bedtime.   sitaGLIPtin 100 MG tablet Commonly known as:  JANUVIA Take 1 tablet (100 mg total) by mouth daily.   traZODone 150 MG tablet Commonly known as:  DESYREL Take 150 mg by mouth at bedtime.   triamcinolone cream 0.1 % Commonly known as:  KENALOG APPLY 1 APPLICATION TOPICALLY 2 (TWO) TIMES DAILY.   vitamin B-12 1000 MCG tablet Commonly known as:  CYANOCOBALAMIN Take 1,000 mcg by mouth daily.   vitamin E 400 UNIT capsule Take 400 Units by mouth daily.       Allergies:  Allergies  Allergen Reactions  . Metformin And Related Diarrhea      Past Medical History:  Diagnosis Date  . Acute deep vein thrombosis (DVT) of distal vein of right lower extremity ([place]) 12/2013   Acute thrombus of R proximal gastrocnemius vein. Symptomatic provoked distal DVT  . Anxiety   . Cellulitis of leg, left 01/16/2014  . Chronic back pain 03/2012   lumbar DDD with  herniation - s/p L S1 transforaminal ESI (10/2012 Dr. Sharlet Salina)  . Chronic venous insufficiency   . Coronary artery disease    CABG 1998  . Cystocele   . Depression   . Diabetes mellitus    type 2, followed by endo.  . Diastolic dysfunction   . Diverticulosis   . Hemorrhoids   . History of heart attack   . Hx of echocardiogram    a. Echo 10/13: mild LVH, EF 55-60%, Gr 1 diast dysfn, PASP 32  . Hyperlipidemia   . Hypertension   . Irritable bowel syndrome   . Microcytic anemia   . PAD (peripheral artery disease) ([place]) 2013   ABI: R 0.91, L 0.83  . PUD (peptic ulcer disease)   . Varicose veins   . Venous ulcer of leg ([place]) 12/12/2013   Required prolonged treatment by wound center  . Viral gastroenteritis due to [place] virus 05/13/2015   hospitalization    Past Surgical History:  Procedure Laterality Date  . ABDOMINAL HYSTERECTOMY  1975   TAH,BSO  . ABI  2013   R 0.91, L 0.83  . APPENDECTOMY     per pt report  . CARDIOVASCULAR STRESS TEST  2015   low risk myoview ([place])  . CATARACT SURGERY    . CHOLECYSTECTOMY    . COLONOSCOPY  02/2010   extensive diverticulosis throughout, small internal hemorrhoids, rec rpt 5 yrs (Dr. Allyn Kenner)  . CORONARY ARTERY BYPASS GRAFT  1998  . dexa scan  09/2011   femur -0.8, forearm 0.6 => normal  . ENDOVENOUS ABLATION SAPHENOUS VEIN W/ LASER Left 03/2014   Kellie Simmering  . ESI  2014   L S1 transforaminal ESI x2 ([place]) without improvement  . HAND SURGERY    . lexiscan myoview  03/2011   Small basal inferolateral and anterolateral reversible perfusion defect suggests ischemia.  EF was normal.   old infarct, no new ischemia  . toe  surgery    . TYMPANOPLASTY  1960s   R side  . Vault prolapse A & P repair  2009   Dr Rhodia Albright [facility]    Family History  Problem Relation Age of Onset  . Hypertension Mother   . Stroke Mother   . Diabetes Father   . Diabetes Sister   . Diabetes Brother   . Heart disease Son   . Colon cancer Neg Hx     Social History:  reports that she quit smoking about 38 years ago. She has never used smokeless tobacco. She reports that she does not drink alcohol or use drugs.  Review of Systems:  No history of thyroid disease  Lab Results  Component Value Date   TSH 2.41 10/26/2016   TSH 2.71 12/30/2015   TSH 1.82 10/31/2014   [name]4 0.91 10/26/2016     HYPERTENSION:  well-controlled on Ramipril 5 mg   HYPERLIPIDEMIA: The lipid abnormality consists of elevated LDL treated with pravastatin To be followed by cardiologist and PCP  Lab Results  Component Value Date   CHOL 118 12/30/2015   HDL 45.40 12/30/2015   LDLCALC 56 12/30/2015   TRIG 80.0 12/30/2015   CHOLHDL 3 12/30/2015    Last foot exam in 10/2016      Examination:   BP 110/60   Pulse 78   Ht 4\' 7"  (1.397 m)   Wt 147 lb (66.7 kg)   SpO2 98%   BMI 34.17 kg/m   Body mass index is 34.17 kg/m.     ASSESSMENT/  PLAN:   DIABETES type 2 with obesity  The patient's diabetes control is appearing to be worse although A1c is only 7% However her blood sugars recently are averaging over 200 This is partly from having steroids last month and now having significant amount of back pain  Although she does not need intensive control because of her age and multiple medical problems she is still having readings well over 200 at times and will benefit from adding Januvia for postprandial hyperglycemia She will continue Actos She will start with 100 mg Januvia since her renal function is normal  Discussed that she can start cutting back on carbohydrates and eliminate cereal as much as possible in the morning which  causes significant hyperglycemia To check blood sugars rotating fasting and after meals and call if consistently high  Fatigue: Although this is a chronic problem will recheck her labs today for CBC  Influenza vaccine given  Patient Instructions  Check blood sugars on waking up 2-3 days a week  Also check blood sugars about 2 hours after meals and do this after different meals by rotation  Recommended blood sugar levels on waking up are 90-130 and about 2 hours after meal is 130-160  Please bring your blood sugar monitor to each visit, thank you  Stay on Actos and Januvia  No cereal in am  Total visit time = 15 minutes  Elayne Snare 01/16/2018, 1:55 PM      Note: This office note was prepared with Dragon voice recognition system technology. Any transcriptional errors that result from this process are unintentional.

## 2018-01-15 NOTE — Patient Instructions (Addendum)
Check blood sugars on waking up 2-3 days a week  Also check blood sugars about 2 hours after meals and do this after different meals by rotation  Recommended blood sugar levels on waking up are 90-130 and about 2 hours after meal is 130-160  Please bring your blood sugar monitor to each visit, thank you  Stay on Actos and Januvia  No cereal in am

## 2018-01-15 NOTE — Telephone Encounter (Signed)
Patient is stating they need Dr. Dwyane Dee to write her a prescription for one touch verioflex test strips.  [id number] - [place], 

## 2018-01-16 ENCOUNTER — Other Ambulatory Visit: Payer: Self-pay

## 2018-01-16 ENCOUNTER — Other Ambulatory Visit: Payer: Self-pay | Admitting: Endocrinology

## 2018-01-16 ENCOUNTER — Encounter: Payer: Self-pay | Admitting: Endocrinology

## 2018-01-16 DIAGNOSIS — M5416 Radiculopathy, lumbar region: Secondary | ICD-10-CM | POA: Diagnosis not present

## 2018-01-16 DIAGNOSIS — M5136 Other intervertebral disc degeneration, lumbar region: Secondary | ICD-10-CM | POA: Diagnosis not present

## 2018-01-16 DIAGNOSIS — M48062 Spinal stenosis, lumbar region with neurogenic claudication: Secondary | ICD-10-CM | POA: Diagnosis not present

## 2018-01-16 MED ORDER — GLUCOSE BLOOD VI STRP: ORAL_STRIP | 6 refills | Status: DC

## 2018-01-16 NOTE — Telephone Encounter (Signed)
Sent!

## 2018-01-17 ENCOUNTER — Telehealth: Payer: Self-pay

## 2018-01-17 DIAGNOSIS — I5030 Unspecified diastolic (congestive) heart failure: Secondary | ICD-10-CM | POA: Diagnosis not present

## 2018-01-17 DIAGNOSIS — I872 Venous insufficiency (chronic) (peripheral): Secondary | ICD-10-CM | POA: Diagnosis not present

## 2018-01-17 DIAGNOSIS — Z9181 History of falling: Secondary | ICD-10-CM | POA: Diagnosis not present

## 2018-01-17 DIAGNOSIS — Z87891 Personal history of nicotine dependence: Secondary | ICD-10-CM | POA: Diagnosis not present

## 2018-01-17 DIAGNOSIS — M4807 Spinal stenosis, lumbosacral region: Secondary | ICD-10-CM | POA: Diagnosis not present

## 2018-01-17 DIAGNOSIS — I2510 Atherosclerotic heart disease of native coronary artery without angina pectoris: Secondary | ICD-10-CM | POA: Diagnosis not present

## 2018-01-17 DIAGNOSIS — I480 Paroxysmal atrial fibrillation: Secondary | ICD-10-CM | POA: Diagnosis not present

## 2018-01-17 DIAGNOSIS — E1142 Type 2 diabetes mellitus with diabetic polyneuropathy: Secondary | ICD-10-CM | POA: Diagnosis not present

## 2018-01-17 DIAGNOSIS — M5442 Lumbago with sciatica, left side: Secondary | ICD-10-CM | POA: Diagnosis not present

## 2018-01-17 DIAGNOSIS — F411 Generalized anxiety disorder: Secondary | ICD-10-CM | POA: Diagnosis not present

## 2018-01-17 DIAGNOSIS — Z993 Dependence on wheelchair: Secondary | ICD-10-CM | POA: Diagnosis not present

## 2018-01-17 DIAGNOSIS — M47814 Spondylosis without myelopathy or radiculopathy, thoracic region: Secondary | ICD-10-CM | POA: Diagnosis not present

## 2018-01-17 DIAGNOSIS — I110 Hypertensive heart disease with heart failure: Secondary | ICD-10-CM | POA: Diagnosis not present

## 2018-01-17 DIAGNOSIS — M461 Sacroiliitis, not elsewhere classified: Secondary | ICD-10-CM | POA: Diagnosis not present

## 2018-01-17 DIAGNOSIS — M47816 Spondylosis without myelopathy or radiculopathy, lumbar region: Secondary | ICD-10-CM | POA: Diagnosis not present

## 2018-01-17 DIAGNOSIS — M4186 Other forms of scoliosis, lumbar region: Secondary | ICD-10-CM | POA: Diagnosis not present

## 2018-01-17 DIAGNOSIS — M5441 Lumbago with sciatica, right side: Secondary | ICD-10-CM | POA: Diagnosis not present

## 2018-01-17 DIAGNOSIS — Z7982 Long term (current) use of aspirin: Secondary | ICD-10-CM | POA: Diagnosis not present

## 2018-01-17 NOTE — Telephone Encounter (Signed)
Copied from [place] [phone] [id number]. Topic: General - Other >> Jan 17, 2018  3:41 PM Janace Aris A wrote: Reason for CRM: [facility], called in requesting verbal orders for the pt's plan of care, 1 x a week for 6 weeks, and 1 x every other week for 3 weeks for nursing, disease management, and medication teaching.   Please advise

## 2018-01-18 ENCOUNTER — Telehealth: Payer: Self-pay | Admitting: Family Medicine

## 2018-01-18 DIAGNOSIS — E1142 Type 2 diabetes mellitus with diabetic polyneuropathy: Secondary | ICD-10-CM | POA: Diagnosis not present

## 2018-01-18 DIAGNOSIS — M5441 Lumbago with sciatica, right side: Secondary | ICD-10-CM | POA: Diagnosis not present

## 2018-01-18 DIAGNOSIS — M4807 Spinal stenosis, lumbosacral region: Secondary | ICD-10-CM | POA: Diagnosis not present

## 2018-01-18 DIAGNOSIS — M5136 Other intervertebral disc degeneration, lumbar region: Secondary | ICD-10-CM | POA: Diagnosis not present

## 2018-01-18 DIAGNOSIS — M5416 Radiculopathy, lumbar region: Secondary | ICD-10-CM | POA: Diagnosis not present

## 2018-01-18 DIAGNOSIS — M47816 Spondylosis without myelopathy or radiculopathy, lumbar region: Secondary | ICD-10-CM | POA: Diagnosis not present

## 2018-01-18 DIAGNOSIS — M5442 Lumbago with sciatica, left side: Secondary | ICD-10-CM | POA: Diagnosis not present

## 2018-01-18 DIAGNOSIS — M48062 Spinal stenosis, lumbar region with neurogenic claudication: Secondary | ICD-10-CM | POA: Diagnosis not present

## 2018-01-18 DIAGNOSIS — I2510 Atherosclerotic heart disease of native coronary artery without angina pectoris: Secondary | ICD-10-CM | POA: Diagnosis not present

## 2018-01-18 NOTE — Telephone Encounter (Signed)
Spoke with Becky Sax of [facility] informing her Dr. Darnell Level is giving verbal orders for therapies requested.

## 2018-01-18 NOTE — Telephone Encounter (Signed)
Agree with this verbal order. Thanks.

## 2018-01-18 NOTE — Telephone Encounter (Signed)
Copied from [place] [id number]. Topic: Quick Communication - Home Health Verbal Orders >> Jan 18, 2018  4:00 PM Bea Graff, NT wrote: Caller/Agency: Jobe Gibbon, [place] Number: [phone] may leave message Requesting OT/PT/Skilled Nursing/Social Work: PT and to add OT  Frequency: 2 times a week for 4 weeks

## 2018-01-18 NOTE — Telephone Encounter (Signed)
Agree with this verbal order thanks.  

## 2018-01-19 ENCOUNTER — Other Ambulatory Visit: Payer: Self-pay | Admitting: Endocrinology

## 2018-01-19 NOTE — Telephone Encounter (Signed)
Left message for Erline Levine of [facility] informing her Dr. Darnell Level is giving verbal orders for the requested therapies.

## 2018-01-22 ENCOUNTER — Ambulatory Visit: Payer: Medicare Other

## 2018-01-22 ENCOUNTER — Ambulatory Visit: Payer: Medicare Other | Admitting: Endocrinology

## 2018-01-22 ENCOUNTER — Ambulatory Visit: Payer: Medicare Other | Admitting: Podiatry

## 2018-01-22 DIAGNOSIS — M4807 Spinal stenosis, lumbosacral region: Secondary | ICD-10-CM | POA: Diagnosis not present

## 2018-01-22 DIAGNOSIS — E1142 Type 2 diabetes mellitus with diabetic polyneuropathy: Secondary | ICD-10-CM | POA: Diagnosis not present

## 2018-01-22 DIAGNOSIS — I2510 Atherosclerotic heart disease of native coronary artery without angina pectoris: Secondary | ICD-10-CM | POA: Diagnosis not present

## 2018-01-22 DIAGNOSIS — M47816 Spondylosis without myelopathy or radiculopathy, lumbar region: Secondary | ICD-10-CM | POA: Diagnosis not present

## 2018-01-22 DIAGNOSIS — M5442 Lumbago with sciatica, left side: Secondary | ICD-10-CM | POA: Diagnosis not present

## 2018-01-22 DIAGNOSIS — M5441 Lumbago with sciatica, right side: Secondary | ICD-10-CM | POA: Diagnosis not present

## 2018-01-24 ENCOUNTER — Telehealth: Payer: Self-pay

## 2018-01-24 ENCOUNTER — Ambulatory Visit (INDEPENDENT_AMBULATORY_CARE_PROVIDER_SITE_OTHER): Payer: Medicare Other

## 2018-01-24 VITALS — BP 118/60 | HR 67 | Temp 98.4°F | Ht <= 58 in | Wt 145.0 lb

## 2018-01-24 DIAGNOSIS — M5441 Lumbago with sciatica, right side: Secondary | ICD-10-CM | POA: Diagnosis not present

## 2018-01-24 DIAGNOSIS — I2510 Atherosclerotic heart disease of native coronary artery without angina pectoris: Secondary | ICD-10-CM | POA: Diagnosis not present

## 2018-01-24 DIAGNOSIS — I10 Essential (primary) hypertension: Secondary | ICD-10-CM

## 2018-01-24 DIAGNOSIS — M5442 Lumbago with sciatica, left side: Secondary | ICD-10-CM | POA: Diagnosis not present

## 2018-01-24 DIAGNOSIS — E1142 Type 2 diabetes mellitus with diabetic polyneuropathy: Secondary | ICD-10-CM | POA: Diagnosis not present

## 2018-01-24 DIAGNOSIS — Z0000 Encounter for general adult medical examination without abnormal findings: Secondary | ICD-10-CM | POA: Diagnosis not present

## 2018-01-24 DIAGNOSIS — E785 Hyperlipidemia, unspecified: Secondary | ICD-10-CM | POA: Diagnosis not present

## 2018-01-24 DIAGNOSIS — M4807 Spinal stenosis, lumbosacral region: Secondary | ICD-10-CM | POA: Diagnosis not present

## 2018-01-24 DIAGNOSIS — M47816 Spondylosis without myelopathy or radiculopathy, lumbar region: Secondary | ICD-10-CM | POA: Diagnosis not present

## 2018-01-24 LAB — LIPID PANEL
Cholesterol: 124 mg/dL (ref 0–200)
HDL: 53.8 mg/dL (ref >39.00)
LDL Cholesterol: 56 mg/dL (ref 0–99)
NonHDL: 70.12
TRIGLYCERIDES: 69 mg/dL (ref 0.0–149.0)
Total CHOL/HDL Ratio: 2
VLDL: 13.8 mg/dL (ref 0.0–40.0)

## 2018-01-24 LAB — COMPREHENSIVE METABOLIC PANEL
ALK PHOS: 110 U/L (ref 39–117)
ALT: 16 U/L (ref 0–35)
AST: 19 U/L (ref 0–37)
Albumin: 3.7 g/dL (ref 3.5–5.2)
BILIRUBIN TOTAL: 0.6 mg/dL (ref 0.2–1.2)
BUN: 20 mg/dL (ref 6–23)
CALCIUM: 9.6 mg/dL (ref 8.4–10.5)
CO2: 31 mEq/L (ref 19–32)
Chloride: 98 mEq/L (ref 96–112)
Creatinine, Ser: 0.9 mg/dL (ref 0.40–1.20)
GFR: 62.71 mL/min (ref >60.00)
Glucose, Bld: 161 mg/dL — ABNORMAL HIGH (ref 70–99)
Potassium: 4.8 mEq/L (ref 3.5–5.1)
Sodium: 135 mEq/L (ref 135–145)
TOTAL PROTEIN: 7.1 g/dL (ref 6.0–8.3)

## 2018-01-24 NOTE — Progress Notes (Signed)
PCP notes:   Health maintenance:  Foot exam - PCP follow-up requested Colonoscopy - pt declined  Abnormal screenings:   Fall risk - hx of multiple falls Fall Risk  01/24/2018 12/08/2016 12/06/2016 11/16/2015 06/17/2013  Falls in the past year? Yes Yes No Yes Yes  Number falls in past yr: 2 or more 1 - 2 or more 1  Injury with Fall? Yes No - Yes Yes  Risk Factor Category  High Fall Risk - - High Fall Risk High Fall Risk  Risk for fall due to : Impaired balance/gait;Impaired mobility;History of fall(s) - - History of fall(s);Impaired balance/gait;Impaired mobility Impaired mobility  Follow up - - - Falls evaluation completed;Falls prevention discussed;Education provided -    Patient concerns:   Patient requested for DME and home health aide. PCP notified.   Nurse concerns:  Please complete hearing exam at CPE.   Next PCP appt:   01/29/2018 @ 1415

## 2018-01-24 NOTE — Telephone Encounter (Signed)
During AWV, patient's daughter-in-law Cyndi requested patient have an order for a rolling walker with a seat and large wheels. Additionally, Cyndi requested an order for a wheelchair.   Per patient, she is already receiving home health nursing and PT. Cyndi, however, states patient needs an aide to help with meal preparation and housekeeping.

## 2018-01-24 NOTE — Patient Instructions (Signed)
Ms. Alviar , Thank you for taking time to come for your Medicare Wellness Visit. I appreciate your ongoing commitment to your health goals. Please review the following plan we discussed and let me know if I can assist you in the future.   These are the goals we discussed: Goals    . Patient Stated     Starting 01/24/2018, I will continue to take medications as prescribed.        This is a list of the screening recommended for you and due dates:  Health Maintenance  Topic Date Due  . Complete foot exam   01/29/2018*  . DTaP/Tdap/Td vaccine (1 - Tdap) 10/20/2027*  . Colon Cancer Screening  04/03/2049*  . Hemoglobin A1C  07/17/2018  . Eye exam for diabetics  08/03/2018  . Tetanus Vaccine  10/20/2027  . Flu Shot  Completed  . DEXA scan (bone density measurement)  Completed  . Pneumonia vaccines  Completed  *Topic was postponed. The date shown is not the original due date.   Preventive Care for Adults  A healthy lifestyle and preventive care can promote health and wellness. Preventive health guidelines for adults include the following key practices.  . A routine yearly physical is a good way to check with your health care provider about your health and preventive screening. It is a chance to share any concerns and updates on your health and to receive a thorough exam.  . Visit your dentist for a routine exam and preventive care every 6 months. Brush your teeth twice a day and floss once a day. Good oral hygiene prevents tooth decay and gum disease.  . The frequency of eye exams is based on your age, health, family medical history, use  of contact lenses, and other factors. Follow your health care provider's recommendations for frequency of eye exams.  . Eat a healthy diet. Foods like vegetables, fruits, whole grains, low-fat dairy products, and lean protein foods contain the nutrients you need without too many calories. Decrease your intake of foods high in solid fats, added sugars,  and salt. Eat the right amount of calories for you. Get information about a proper diet from your health care provider, if necessary.  . Regular physical exercise is one of the most important things you can do for your health. Most adults should get at least 150 minutes of moderate-intensity exercise (any activity that increases your heart rate and causes you to sweat) each week. In addition, most adults need muscle-strengthening exercises on 2 or more days a week.  Silver Sneakers may be a benefit available to you. To determine eligibility, you may visit the website: www.silversneakers.com or contact program at 808-663-4577 Mon-Fri between 8AM-8PM.   . Maintain a healthy weight. The body mass index (BMI) is a screening tool to identify possible weight problems. It provides an estimate of body fat based on height and weight. Your health care provider can find your BMI and can help you achieve or maintain a healthy weight.   For adults 20 years and older: ? A BMI below 18.5 is considered underweight. ? A BMI of 18.5 to 24.9 is normal. ? A BMI of 25 to 29.9 is considered overweight. ? A BMI of 30 and above is considered obese.   . Maintain normal blood lipids and cholesterol levels by exercising and minimizing your intake of saturated fat. Eat a balanced diet with plenty of fruit and vegetables. Blood tests for lipids and cholesterol should begin at age 49 and  be repeated every 5 years. If your lipid or cholesterol levels are high, you are over 50, or you are at high risk for heart disease, you may need your cholesterol levels checked more frequently. Ongoing high lipid and cholesterol levels should be treated with medicines if diet and exercise are not working.  . If you smoke, find out from your health care provider how to quit. If you do not use tobacco, please do not start.  . If you choose to drink alcohol, please do not consume more than 2 drinks per day. One drink is considered to be 12  ounces (355 mL) of beer, 5 ounces (148 mL) of wine, or 1.5 ounces (44 mL) of liquor.  . If you are 44-61 years old, ask your health care provider if you should take aspirin to prevent strokes.  . Use sunscreen. Apply sunscreen liberally and repeatedly throughout the day. You should seek shade when your shadow is shorter than you. Protect yourself by wearing long sleeves, pants, a wide-brimmed hat, and sunglasses year round, whenever you are outdoors.  . Once a month, do a whole body skin exam, using a mirror to look at the skin on your back. Tell your health care provider of new moles, moles that have irregular borders, moles that are larger than a pencil eraser, or moles that have changed in shape or color.

## 2018-01-24 NOTE — Progress Notes (Signed)
Subjective:   [name] is a 84 y.o. female who presents for Medicare Annual (Subsequent) preventive examination.  Review of Systems:  N/A Cardiac Risk Factors include: advanced age (>12men, >78 women);diabetes mellitus;dyslipidemia;hypertension;obesity (BMI >30kg/m2)     Objective:     Vitals: BP 118/60 (BP Location: Left Arm, Patient Position: Sitting, Cuff Size: Normal)   Pulse 67   Temp 98.4 F (36.9 C) (Oral)   Ht 4\' 9"  (1.448 m) Comment: per chart  Wt 145 lb (65.8 kg) Comment: per chart  SpO2 96%   BMI 31.38 kg/m   Body mass index is 31.38 kg/m.  Advanced Directives 01/24/2018 11/16/2015 09/27/2015 05/13/2015 05/12/2015 03/04/2014 01/05/2014  Does Patient Have a Medical Advance Directive? No Yes Yes Yes No Yes Yes  Type of Advance Directive - [place];Living will Living will Living will - Living will;Healthcare Power of [place]  Does patient want to make changes to medical advance directive? No - Patient declined No - Patient declined - - - - -  Copy of Press photographer in Chart? - - - - - No - copy requested No - copy requested  Would patient like information on creating a medical advance directive? No - Patient declined - - - No - patient declined information - -    Tobacco Social History   Tobacco Use  Smoking Status Former Smoker  . Last attempt to quit: 03/05/1979  . Years since quitting: 38.9  Smokeless Tobacco Never Used     Counseling given: No   Clinical Intake:  Pre-visit preparation completed: Yes  Pain : 0-10 Pain Score: 8  Pain Type: Chronic pain Pain Location: Back Pain Orientation: Lower     Nutritional Status: BMI 25 -29 Overweight Nutritional Risks: None Diabetes: Yes CBG done?: No Did pt. bring in CBG monitor from home?: No  How often do you need to have someone help you when you read instructions, pamphlets, or other written materials from your doctor or pharmacy?: 1 -  Never What is the last grade level you completed in school?: 9th grade  Interpreter Needed?: No  Comments: pt is a widow and lives with others in home Information entered by :: LPinson, LPN  Past Medical History:  Diagnosis Date  . Acute deep vein thrombosis (DVT) of distal vein of right lower extremity ([place]) 12/2013   Acute thrombus of R proximal gastrocnemius vein. Symptomatic provoked distal DVT  . Anxiety   . Cellulitis of leg, left 01/16/2014  . Chronic back pain 03/2012   lumbar DDD with herniation - s/p L S1 transforaminal ESI (10/2012 Dr. Sharlet Salina)  . Chronic venous insufficiency   . Coronary artery disease    CABG 1998  . Cystocele   . Depression   . Diabetes mellitus    type 2, followed by endo.  . Diastolic dysfunction   . Diverticulosis   . Hemorrhoids   . History of heart attack   . Hx of echocardiogram    a. Echo 10/13: mild LVH, EF 55-60%, Gr 1 diast dysfn, PASP 32  . Hyperlipidemia   . Hypertension   . Irritable bowel syndrome   . Microcytic anemia   . PAD (peripheral artery disease) ([place]) 2013   ABI: R 0.91, L 0.83  . PUD (peptic ulcer disease)   . Varicose veins   . Venous ulcer of leg ([place]) 12/12/2013   Required prolonged treatment by wound center  . Viral gastroenteritis due to [place] virus  05/13/2015   hospitalization   Past Surgical History:  Procedure Laterality Date  . ABDOMINAL HYSTERECTOMY  1975   TAH,BSO  . ABI  2013   R 0.91, L 0.83  . APPENDECTOMY     per pt report  . CARDIOVASCULAR STRESS TEST  2015   low risk myoview (Nahser)  . CATARACT SURGERY    . CHOLECYSTECTOMY    . COLONOSCOPY  02/2010   extensive diverticulosis throughout, small internal hemorrhoids, rec rpt 5 yrs (Dr. Allyn Kenner)  . CORONARY ARTERY BYPASS GRAFT  1998  . dexa scan  09/2011   femur -0.8, forearm 0.6 => normal  . ENDOVENOUS ABLATION SAPHENOUS VEIN W/ LASER Left 03/2014   Kellie Simmering  . ESI  2014   L S1 transforaminal ESI x2 ([place]) without improvement  . HAND  SURGERY    . lexiscan myoview  03/2011   Small basal inferolateral and anterolateral reversible perfusion defect suggests ischemia.  EF was normal.   old infarct, no new ischemia  . toe surgery    . TYMPANOPLASTY  1960s   R side  . Vault prolapse A & P repair  2009   Dr Rhodia Albright [facility] hospital   Family History  Problem Relation Age of Onset  . Hypertension Mother   . Stroke Mother   . Diabetes Father   . Diabetes Sister   . Diabetes Brother   . Heart disease Son   . Colon cancer Neg Hx    Social History   Socioeconomic History  . Marital status: Single    Spouse name: Not on file  . Number of children: Not on file  . Years of education: Not on file  . Highest education level: Not on file  Occupational History  . Occupation: Waitress   Social Needs  . Financial resource strain: Not on file  . Food insecurity:    Worry: Not on file    Inability: Not on file  . Transportation needs:    Medical: Not on file    Non-medical: Not on file  Tobacco Use  . Smoking status: Former Smoker    Last attempt to quit: 03/05/1979    Years since quitting: 38.9  . Smokeless tobacco: Never Used  Substance and Sexual Activity  . Alcohol use: No    Alcohol/week: 0.0 standard drinks  . Drug use: No  . Sexual activity: Not Currently    Comment: 1st intercourse 84yo.--- 2 sexual partners  Lifestyle  . Physical activity:    Days per week: Not on file    Minutes per session: Not on file  . Stress: Not on file  Relationships  . Social connections:    Talks on phone: Not on file    Gets together: Not on file    Attends religious service: Not on file    Active member of club or organization: Not on file    Attends meetings of clubs or organizations: Not on file    Relationship status: Not on file  Other Topics Concern  . 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   Independent for all ADLs, lives with a boyfriend.      Outpatient Encounter  Medications as of 01/24/2018  Medication Sig  . acetaminophen (TYLENOL) 500 MG tablet Take 500 mg by mouth every 6 (six) hours as needed for mild pain.  Marland Kitchen ALPRAZolam (XANAX) 1 MG tablet Take 0.5 mg by mouth 4 (four) times daily as needed  for anxiety.   Marland Kitchen aspirin EC 81 MG tablet Take 81 mg by mouth daily.  Marland Kitchen DEXILANT 30 MG capsule TAKE 1 CAPSULE (30 MG TOTAL) BY MOUTH DAILY.  Marland Kitchen diclofenac sodium (VOLTAREN) 1 % GEL Apply 1 application topically 3 (three) times daily.  . furosemide (LASIX) 20 MG tablet TAKE 0.5 TABLETS (10 MG TOTAL) BY MOUTH DAILY.  Marland Kitchen glucosamine-chondroitin 500-400 MG tablet Take 1 tablet by mouth daily.   Marland Kitchen glucose blood (ONE TOUCH ULTRA TEST) test strip USE AS INSTRUCTED TO CHECK BLOOD SUGAR ONCE A DAY Dx code: E11.9  . HYDROcodone-acetaminophen (NORCO/VICODIN) 5-325 MG tablet Take 1 tablet by mouth 3 (three) times daily as needed for moderate pain.  Marland Kitchen loperamide (IMODIUM) 2 MG capsule Take 2 mg by mouth 4 (four) times daily as needed for diarrhea or loose stools.  Marland Kitchen loratadine (CLARITIN) 10 MG tablet Take 10 mg by mouth daily as needed for allergies.  . methocarbamol (ROBAXIN) 500 MG tablet Take 0.5-1 tablets (250-500 mg total) by mouth 3 (three) times daily as needed for muscle spasms.  . metoprolol tartrate (LOPRESSOR) 25 MG tablet TAKE ONE AND HALF TABLETS (37.5 MG TOTAL) BY MOUTH 2 (TWO) TIMES DAILY.  . Multiple Vitamin (MULTIVITAMIN) tablet Take 1 tablet by mouth daily. Patient reports takes a new vitamin to replace her centrum as recommended by her eye doctor.  . nitroGLYCERIN (NITROSTAT) 0.4 MG SL tablet Place 1 tablet (0.4 mg total) under the tongue every 5 (five) minutes as needed for chest pain.  Marland Kitchen nystatin-triamcinolone ointment (MYCOLOG) APPLY 1 APPLICATION TOPICALLY 2 (TWO) TIMES DAILY. AS NEEDED FOR ITCHING  . ONETOUCH VERIO test strip USE TO TEST BLOOD SUGAR ONCE DAILY  . ONETOUCH VERIO test strip USE TO TEST BLOOD SUGAR ONCE DAILY  . pioglitazone (ACTOS) 30 MG  tablet Take 1 tablet (30 mg total) by mouth daily.  . pravastatin (PRAVACHOL) 20 MG tablet TAKE 1 TABLET BY MOUTH EVERY DAY  . predniSONE (DELTASONE) 20 MG tablet Take two tablets daily for 3 days followed by one tablet daily for 4 days  . Probiotic Product (ALIGN) 4 MG CAPS Take 1 capsule (4 mg total) by mouth daily.  . ramipril (ALTACE) 5 MG capsule TAKE ONE CAPSULE BY MOUTH EVERYDAY FOR BLOOD PRESSURE  . ranitidine (ZANTAC) 150 MG tablet Take 1 tablet (150 mg total) by mouth at bedtime.  . sitaGLIPtin (JANUVIA) 100 MG tablet Take 1 tablet (100 mg total) by mouth daily.  . traZODone (DESYREL) 150 MG tablet Take 150 mg by mouth at bedtime.   . triamcinolone cream (KENALOG) 0.1 % APPLY 1 APPLICATION TOPICALLY 2 (TWO) TIMES DAILY.  . vitamin B-12 (CYANOCOBALAMIN) 1000 MCG tablet Take 1,000 mcg by mouth daily.  . vitamin E 400 UNIT capsule Take 400 Units by mouth daily.   No facility-administered encounter medications on file as of 01/24/2018.     Activities of Daily Living In your present state of health, do you have any difficulty performing the following activities: 01/24/2018  Hearing? Y  Vision? Y  Difficulty concentrating or making decisions? N  Walking or climbing stairs? Y  Dressing or bathing? N  Doing errands, shopping? Y  Preparing Food and eating ? N  Using the Toilet? N  In the past six months, have you accidently leaked urine? Y  Do you have problems with loss of bowel control? Y  Managing your Medications? N  Managing your Finances? N  Housekeeping or managing your Housekeeping? Y  Some recent data might  be hidden    Patient Care Team: Ria Bush, MD as PCP - General (Family Medicine) Nahser, Wonda Cheng, MD as PCP - Cardiology (Cardiology)    Assessment:   This is a routine wellness examination for [place].  Hearing Screening Comments: Hearing exam at CPE Vision Screening Comments: Vision exam in 2019 with Dr. Gloriann Loan   Exercise Activities and Dietary  recommendations Current Exercise Habits: Home exercise routine, Type of exercise: Other - see comments(chair exercises), Time (Minutes): 30, Frequency (Times/Week): 7, Weekly Exercise (Minutes/Week): 210, Intensity: Mild, Exercise limited by: orthopedic condition(s)  Goals    . Patient Stated     Starting 01/24/2018, I will continue to take medications as prescribed.        Fall Risk Fall Risk  01/24/2018 12/08/2016 12/06/2016 11/16/2015 06/17/2013  Falls in the past year? Yes Yes No Yes Yes  Number falls in past yr: 2 or more 1 - 2 or more 1  Injury with Fall? Yes No - Yes Yes  Risk Factor Category  High Fall Risk - - High Fall Risk High Fall Risk  Risk for fall due to : Impaired balance/gait;Impaired mobility;History of fall(s) - - History of fall(s);Impaired balance/gait;Impaired mobility Impaired mobility  Follow up - - - Falls evaluation completed;Falls prevention discussed;Education provided -   Depression Screen PHQ 2/9 Scores 01/24/2018 12/08/2016 11/16/2015 06/17/2013  PHQ - 2 Score 0 0 1 3  PHQ- 9 Score 0 - - 7     Cognitive Function MMSE - Mini Mental State Exam 01/24/2018 11/16/2015  Orientation to time 5 5  Orientation to Place 5 5  Registration 3 3  Attention/ Calculation 0 0  Recall 3 3  Language- name 2 objects 0 0  Language- repeat 1 1  Language- follow 3 step command 3 0  Language- follow 3 step command-comments - pt was unable to follow 3 of 3 step command  Language- read & follow direction 0 0  Write a sentence 0 0  Copy design 0 0  Total score 20 17       PLEASE NOTE: A Mini-Cog screen was completed. Maximum score is 20. A value of 0 denotes this part of [place] MMSE was not completed or the patient failed this part of the Mini-Cog screening.   Mini-Cog Screening Orientation to Time - Max 5 pts Orientation to Place - Max 5 pts Registration - Max 3 pts Recall - Max 3 pts Language Repeat - Max 1 pts Language Follow 3 Step Command - Max 3  pts   Immunization History  Administered Date(s) Administered  . Influenza Split 12/20/2010  . Influenza, High Dose Seasonal PF 03/02/2015, 01/15/2018  . Influenza,inj,Quad PF,6+ Mos 01/01/2013, 12/12/2013, 12/15/2015, 01/09/2017  . Pneumococcal Conjugate-13 06/17/2013  . Pneumococcal Polysaccharide-23 11/16/2015  . Td 10/19/2017  . Zoster 09/06/2010    Screening Tests Health Maintenance  Topic Date Due  . FOOT EXAM  01/29/2018 (Originally 10/26/2017)  . DTaP/Tdap/Td (1 - Tdap) 10/20/2027 (Originally 10/20/2017)  . COLONOSCOPY  04/03/2049 (Originally 02/12/2015)  . HEMOGLOBIN A1C  07/17/2018  . OPHTHALMOLOGY EXAM  08/03/2018  . TETANUS/TDAP  10/20/2027  . INFLUENZA VACCINE  Completed  . DEXA SCAN  Completed  . PNA vac Low Risk Adult  Completed      Plan:     I have personally reviewed, addressed, and noted the following in the patient's chart:  A. Medical and social history B. Use of alcohol, tobacco or illicit drugs  C. Current medications and supplements D. Functional  ability and status E.  Nutritional status F.  Physical activity G. Advance directives H. List of other physicians I.  Hospitalizations, surgeries, and ER visits in previous 12 months J.  [place] to include hearing, vision, cognitive, depression L. Referrals and appointments - none  In addition, I have reviewed and discussed with patient certain preventive protocols, quality metrics, and best practice recommendations. A written personalized care plan for preventive services as well as general preventive health recommendations were provided to patient.  See attached scanned questionnaire for additional information.   Signed,   Lindell Noe, MHA, BS, LPN Health Coach

## 2018-01-25 DIAGNOSIS — M5442 Lumbago with sciatica, left side: Secondary | ICD-10-CM | POA: Diagnosis not present

## 2018-01-25 DIAGNOSIS — M4807 Spinal stenosis, lumbosacral region: Secondary | ICD-10-CM | POA: Diagnosis not present

## 2018-01-25 DIAGNOSIS — E1142 Type 2 diabetes mellitus with diabetic polyneuropathy: Secondary | ICD-10-CM | POA: Diagnosis not present

## 2018-01-25 DIAGNOSIS — I2510 Atherosclerotic heart disease of native coronary artery without angina pectoris: Secondary | ICD-10-CM | POA: Diagnosis not present

## 2018-01-25 DIAGNOSIS — M47816 Spondylosis without myelopathy or radiculopathy, lumbar region: Secondary | ICD-10-CM | POA: Diagnosis not present

## 2018-01-25 DIAGNOSIS — M5441 Lumbago with sciatica, right side: Secondary | ICD-10-CM | POA: Diagnosis not present

## 2018-01-25 NOTE — Telephone Encounter (Signed)
Rx written and in Lisa's box.  

## 2018-01-25 NOTE — Telephone Encounter (Signed)
Faxed form [facility].

## 2018-01-27 ENCOUNTER — Encounter: Payer: Self-pay | Admitting: Family Medicine

## 2018-01-27 NOTE — Progress Notes (Addendum)
BP 140/68 (BP Location: Left Arm, Patient Position: Sitting, Cuff Size: Normal)   Pulse 89   Temp 98.2 F (36.8 C) (Oral)   Ht 4\' 9"  (1.448 m)   Wt 144 lb 4 oz (65.4 kg)   SpO2 96%   BMI 31.22 kg/m    CC: AMW f/u visit Subjective:    [name], female    DOB: [birth year], 84 y.o.   MRN: [id number]  HPI: [name] is a 84 y.o. female presenting on 01/29/2018 for Annual Exam (Pt 2.)   Saw [name] last week for medicare wellness visit. Note reviewed.  Known hearing loss - audiologist said she needed ears cleaned prior to further hearing evaluation.   Acute worsening of R lower back pain with R radiculopathy since 12/2017 after sitting on a bench for a funeral s/p MRI. Referred to PMR Dr Sharlet Salina, latest note reviewed - pain likely from L L5/S1 HNP and neurogenic claudication from spinal stenosis L4/5, she had transforaminal epidural steroid injections.   Worsening heartburn despite dexilant 30mg  daily as well as align. She also takes   Preventative: COLONOSCOPY 02/2010 - extensive diverticulosis throughout, small internal hemorrhoids, rec rpt 5 yrs (Dr. Allyn Kenner) - aged out Well woman exam - sees GYN regularly DEXA - 09/2011 femur -0.8, forearm 0.6 => normal Flu shot yearly  prevnar June 28, 2013, pneumovax 06-29-15 Td 28-Jun-2017 zostavax 06/29/2010 shingrix - discussed Advanced planning discussion: working on this at home. Doesn't know who would be HCPOA. Stresses about completing this.  Seat belt use discussed  Sunscreen use discussed. No changing moles on skin.  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 Interim medical history since our last visit reviewed. Allergies and medications reviewed  and updated. Outpatient Medications Prior to Visit  Medication Sig Dispense Refill  . acetaminophen (TYLENOL) 500 MG tablet Take 500 mg by mouth every 6 (six) hours as needed for mild pain.    Marland Kitchen ALPRAZolam (XANAX) 1 MG tablet Take 0.5 mg by mouth 4 (four) times daily as needed for anxiety.     Marland Kitchen aspirin EC 81 MG tablet Take 81 mg by mouth daily.    Marland Kitchen DEXILANT 30 MG capsule TAKE 1 CAPSULE (30 MG TOTAL) BY MOUTH DAILY. 30 capsule 11  . diclofenac sodium (VOLTAREN) 1 % GEL Apply 1 application topically 3 (three) times daily. 1 Tube 1  . furosemide (LASIX) 20 MG tablet TAKE 0.5 TABLETS (10 MG TOTAL) BY MOUTH DAILY. 45 tablet 3  . glucosamine-chondroitin 500-400 MG tablet Take 1 tablet by mouth daily.     Marland Kitchen glucose blood (ONE TOUCH ULTRA TEST) test strip USE AS INSTRUCTED TO CHECK BLOOD SUGAR ONCE A DAY Dx code: E11.9 50 each 5  . loperamide (IMODIUM) 2 MG capsule Take 2 mg by mouth 4 (four) times daily as needed for diarrhea or loose stools.    Marland Kitchen loratadine (CLARITIN) 10 MG tablet Take 10 mg by mouth daily as needed for allergies.    . methocarbamol (ROBAXIN) 500 MG tablet Take 0.5-1 tablets (250-500 mg total) by mouth 3 (three) times daily as needed for muscle spasms. 20 tablet 0  . metoprolol tartrate (LOPRESSOR) 25 MG tablet TAKE ONE AND HALF  TABLETS (37.5 MG TOTAL) BY MOUTH 2 (TWO) TIMES DAILY. 270 tablet 0  . Multiple Vitamin (MULTIVITAMIN) tablet Take 1 tablet by mouth daily. Patient reports takes a new vitamin to replace her centrum as recommended by her eye doctor.    . nitroGLYCERIN (NITROSTAT) 0.4 MG SL tablet Place 1 tablet (0.4 mg total) under the tongue every 5 (five) minutes as needed for chest pain. 25 tablet 3  . nystatin-triamcinolone ointment (MYCOLOG) APPLY 1 APPLICATION TOPICALLY 2 (TWO) TIMES DAILY. AS NEEDED FOR ITCHING 60 g 1  . ONETOUCH VERIO test strip USE TO TEST BLOOD SUGAR ONCE DAILY 100 each 6  . ONETOUCH VERIO test strip USE TO TEST BLOOD SUGAR ONCE DAILY 100 each 6  .  pioglitazone (ACTOS) 30 MG tablet Take 1 tablet (30 mg total) by mouth daily. 90 tablet 3  . pravastatin (PRAVACHOL) 20 MG tablet TAKE 1 TABLET BY MOUTH EVERY DAY 30 tablet 3  . predniSONE (DELTASONE) 20 MG tablet Take two tablets daily for 3 days followed by one tablet daily for 4 days 10 tablet 0  . Probiotic Product (ALIGN) 4 MG CAPS Take 1 capsule (4 mg total) by mouth daily.    . ramipril (ALTACE) 5 MG capsule TAKE ONE CAPSULE BY MOUTH EVERYDAY FOR BLOOD PRESSURE 90 capsule 1  . sitaGLIPtin (JANUVIA) 100 MG tablet Take 1 tablet (100 mg total) by mouth daily. 30 tablet 1  . traZODone (DESYREL) 150 MG tablet Take 150 mg by mouth at bedtime.     . triamcinolone cream (KENALOG) 0.1 % APPLY 1 APPLICATION TOPICALLY 2 (TWO) TIMES DAILY.  1  . vitamin B-12 (CYANOCOBALAMIN) 1000 MCG tablet Take 1,000 mcg by mouth daily.    . vitamin E 400 UNIT capsule Take 400 Units by mouth daily.    Marland Kitchen HYDROcodone-acetaminophen (NORCO/VICODIN) 5-325 MG tablet Take 1 tablet by mouth 3 (three) times daily as needed for moderate pain. 90 tablet 0  . ranitidine (ZANTAC) 150 MG tablet Take 1 tablet (150 mg total) by mouth at bedtime. 30 tablet 3   No facility-administered medications prior to visit.      Per HPI unless specifically indicated in ROS section below Review of Systems     Objective:    BP 140/68 (BP Location: Left Arm, Patient Position: Sitting, Cuff Size: Normal)   Pulse 89   Temp 98.2 F (36.8 C) (Oral)   Ht 4\' 9"  (1.448 m)   Wt 144 lb 4 oz (65.4 kg)   SpO2 96%   BMI 31.22 kg/m   Wt Readings from Last 3 Encounters:  01/29/18 144 lb 4 oz (65.4 kg)  01/24/18 145 lb (65.8 kg)  01/15/18 147 lb (66.7 kg)    Physical Exam  Constitutional: She is oriented to person, place, and time. She appears well-developed and well-nourished. No distress.  Walking with walker  HENT:  Head: Normocephalic and atraumatic.  Right Ear: External ear normal. Decreased hearing is noted.  Left Ear: External ear  normal. Decreased hearing is noted.  Nose: Nose normal.  Mouth/Throat: Uvula is midline, oropharynx is clear and moist and mucous membranes are normal. No oropharyngeal exudate, posterior oropharyngeal edema or posterior oropharyngeal erythema.  Bilateral cerumen impaction  Eyes: Pupils are equal, round, and reactive to light. Conjunctivae and EOM are normal. No scleral icterus.  Neck: Normal range of motion. Neck supple.  Cardiovascular: Normal rate, regular rhythm, normal heart sounds and intact distal pulses.  No murmur heard. Pulses:      Radial pulses  are 2+ on the right side, and 2+ on the left side.  Pulmonary/Chest: Effort normal and breath sounds normal. No respiratory distress. She has no wheezes. She has no rales.  Abdominal: Soft. Bowel sounds are normal. She exhibits no distension and no mass. There is no tenderness. There is no rebound and no guarding.  Musculoskeletal: Normal range of motion. She exhibits no edema.  Lymphadenopathy:    She has no cervical adenopathy.  Neurological: She is alert and oriented to person, place, and time.  CN grossly intact, station and gait intact  Skin: Skin is warm and dry. No rash noted.  Psychiatric: Her mood appears anxious.  Nursing note and vitals reviewed.  Results for orders placed or performed in visit on 01/24/18  Comprehensive metabolic panel  Result Value Ref Range   Sodium 135 135 - 145 mEq/L   Potassium 4.8 3.5 - 5.1 mEq/L   Chloride 98 96 - 112 mEq/L   CO2 31 19 - 32 mEq/L   Glucose, Bld 161 (H) 70 - 99 mg/dL   BUN 20 6 - 23 mg/dL   Creatinine, Ser 0.90 0.40 - 1.20 mg/dL   Total Bilirubin 0.6 0.2 - 1.2 mg/dL   Alkaline Phosphatase 110 39 - 117 U/L   AST 19 0 - 37 U/L   ALT 16 0 - 35 U/L   Total Protein 7.1 6.0 - 8.3 g/dL   Albumin 3.7 3.5 - 5.2 g/dL   Calcium 9.6 8.4 - 10.5 mg/dL   GFR 62.71 >60.00 mL/min  Lipid Panel  Result Value Ref Range   Cholesterol 124 0 - 200 mg/dL   Triglycerides 69.0 0.0 - 149.0 mg/dL    HDL 53.80 >39.00 mg/dL   VLDL 13.8 0.0 - 40.0 mg/dL   LDL Cholesterol 56 0 - 99 mg/dL   Total CHOL/HDL Ratio 2    NonHDL 70.12       Assessment & Plan:   Problem List Items Addressed This Visit    Unsteady gait    New rollator Rx faxed to [facility] last week - her current walker is broken.       Type 2 diabetes mellitus with neurological manifestations, controlled ([place])    Appreciate endo care.       Spinal stenosis of lumbosacral region    Appreciate PMR care.       Peptic ulcer disease    Notes increasing reflux symptoms despite daily dexilant 30mg  and align. She has not been taking zantac. - rec add pepcid 20mg  nightly. Monitor for effect.       Paroxysmal atrial fibrillation (HCC)    Chronic, sounds regular today. Continue metoprolol and aspirin daily.  Not on further anticoagulation due to high fall and bleeding risk.       Hypertension    Chronic, stable. Continue current regimen.       Hyperlipidemia    Chronic, stable. Continue pravastatin 20mg  daily. The ASCVD Risk score Mikey Bussing DC Jr., et al., 2013) failed to calculate for the following reasons:   The 2013 ASCVD risk score is only valid for ages 54 to 55       GAD (generalized anxiety disorder)    Appreciate psych care.       Encounter for chronic pain management    Doing well with hydrocodone TID. Will refill today per pt request.       Diabetic polyneuropathy ([place])   Diabetes mellitus type 2 with peripheral artery disease ([place])   Chronic venous insufficiency  Chronic lower back pain    Appreciate PMR care. Recent steroid shot, currently using OTC muscle rub cream, increased hydrocodone to TID dosing - all with improvement. Now again able to walk but remains weak and has ongoing fear of fall. We recently sent new rolling walker Rx to [place] - current walker has broken.       Relevant Medications   HYDROcodone-acetaminophen (NORCO/VICODIN) 5-325 MG tablet   Cerumen impaction    Audiologist said she would  need full removal before further evaluation. Will refer to ENT to consider full cleaning.       Relevant Orders   Ambulatory referral to ENT   Bilateral hearing loss    Will refer to audiology after ENT evaluation for cerumen impaction.       Relevant Orders   Ambulatory referral to ENT   Advanced directives, counseling/discussion - Primary    Has will at home, needs to sign it. Asked to bring me a copy. Does not know who she would have as HCPOA since her son died, and this causes her stress/anxiety.          Meds ordered this encounter  Medications  . famotidine (PEPCID) 20 MG tablet    Sig: Take 1 tablet (20 mg total) by mouth at bedtime.  Marland Kitchen HYDROcodone-acetaminophen (NORCO/VICODIN) 5-325 MG tablet    Sig: Take 1 tablet by mouth 3 (three) times daily as needed for moderate pain.    Dispense:  90 tablet    Refill:  0   Orders Placed This Encounter  Procedures  . Ambulatory referral to ENT    Referral Priority:   Routine    Referral Type:   Consultation    Referral Reason:   Specialty Services Required    Requested Specialty:   Otolaryngology    Number of Visits Requested:   1    Follow up plan: Return in about 6 weeks (around 03/12/2018) for follow up visit.  Ria Bush, MD

## 2018-01-29 ENCOUNTER — Ambulatory Visit (INDEPENDENT_AMBULATORY_CARE_PROVIDER_SITE_OTHER): Payer: Medicare Other | Admitting: Family Medicine

## 2018-01-29 ENCOUNTER — Encounter: Payer: Self-pay | Admitting: Family Medicine

## 2018-01-29 VITALS — BP 140/68 | HR 89 | Temp 98.2°F | Ht <= 58 in | Wt 144.2 lb

## 2018-01-29 DIAGNOSIS — G8929 Other chronic pain: Secondary | ICD-10-CM | POA: Diagnosis not present

## 2018-01-29 DIAGNOSIS — I872 Venous insufficiency (chronic) (peripheral): Secondary | ICD-10-CM

## 2018-01-29 DIAGNOSIS — M4807 Spinal stenosis, lumbosacral region: Secondary | ICD-10-CM

## 2018-01-29 DIAGNOSIS — K279 Peptic ulcer, site unspecified, unspecified as acute or chronic, without hemorrhage or perforation: Secondary | ICD-10-CM

## 2018-01-29 DIAGNOSIS — H9193 Unspecified hearing loss, bilateral: Secondary | ICD-10-CM | POA: Insufficient documentation

## 2018-01-29 DIAGNOSIS — E1151 Type 2 diabetes mellitus with diabetic peripheral angiopathy without gangrene: Secondary | ICD-10-CM | POA: Diagnosis not present

## 2018-01-29 DIAGNOSIS — I480 Paroxysmal atrial fibrillation: Secondary | ICD-10-CM

## 2018-01-29 DIAGNOSIS — E785 Hyperlipidemia, unspecified: Secondary | ICD-10-CM

## 2018-01-29 DIAGNOSIS — F411 Generalized anxiety disorder: Secondary | ICD-10-CM | POA: Diagnosis not present

## 2018-01-29 DIAGNOSIS — H6123 Impacted cerumen, bilateral: Secondary | ICD-10-CM | POA: Diagnosis not present

## 2018-01-29 DIAGNOSIS — E1149 Type 2 diabetes mellitus with other diabetic neurological complication: Secondary | ICD-10-CM | POA: Diagnosis not present

## 2018-01-29 DIAGNOSIS — Z7189 Other specified counseling: Secondary | ICD-10-CM

## 2018-01-29 DIAGNOSIS — I10 Essential (primary) hypertension: Secondary | ICD-10-CM | POA: Diagnosis not present

## 2018-01-29 DIAGNOSIS — E1142 Type 2 diabetes mellitus with diabetic polyneuropathy: Secondary | ICD-10-CM

## 2018-01-29 DIAGNOSIS — R2681 Unsteadiness on feet: Secondary | ICD-10-CM

## 2018-01-29 DIAGNOSIS — M5442 Lumbago with sciatica, left side: Secondary | ICD-10-CM

## 2018-01-29 DIAGNOSIS — M5441 Lumbago with sciatica, right side: Secondary | ICD-10-CM

## 2018-01-29 MED ORDER — FAMOTIDINE 20 MG PO TABS: 20.0000 mg | ORAL_TABLET | Freq: Every day | ORAL | Status: DC

## 2018-01-29 MED ORDER — HYDROCODONE-ACETAMINOPHEN 5-325 MG PO TABS: 1.0000 | ORAL_TABLET | Freq: Three times a day (TID) | ORAL | 0 refills | Status: DC | PRN

## 2018-01-29 NOTE — Assessment & Plan Note (Signed)
Audiologist said she would need full removal before further evaluation. Will refer to ENT to consider full cleaning.

## 2018-01-29 NOTE — Assessment & Plan Note (Signed)
New rollator Rx faxed to [facility] last week - her current walker is broken.

## 2018-01-29 NOTE — Assessment & Plan Note (Signed)
Notes increasing reflux symptoms despite daily dexilant 30mg  and align. She has not been taking zantac. - rec add pepcid 20mg  nightly. Monitor for effect.

## 2018-01-29 NOTE — Assessment & Plan Note (Signed)
Chronic, stable. Continue current regimen. 

## 2018-01-29 NOTE — Assessment & Plan Note (Signed)
Will refer to audiology after ENT evaluation for cerumen impaction.

## 2018-01-29 NOTE — Assessment & Plan Note (Signed)
Appreciate endo care.  

## 2018-01-29 NOTE — Patient Instructions (Addendum)
If interested, check with pharmacy about new 2 shot shingles series (shingrix).  Take pepcid at night in addition to your dexilant and align.  Work on advanced directive and bring me a copy when you complete to update your chart.  Increase water by 1-2 glasses a day.  We will refer you to ENT for ear cleaning then audiology for repeat hearing evaluation.  Hopefully you will receive rolling walker with seat soon.  Return in 4-6 weeks for follow up visit.

## 2018-01-29 NOTE — Assessment & Plan Note (Addendum)
Appreciate PMR care. Recent steroid shot, currently using OTC muscle rub cream, increased hydrocodone to TID dosing - all with improvement. Now again able to walk but remains weak and has ongoing fear of fall. We recently sent new rolling walker Rx to [place] - current walker has broken.

## 2018-01-29 NOTE — Assessment & Plan Note (Addendum)
Has will at home, needs to sign it. Asked to bring me a copy. Does not know who she would have as HCPOA since her son died, and this causes her stress/anxiety.

## 2018-01-29 NOTE — Assessment & Plan Note (Signed)
Appreciate psych care.  ?

## 2018-01-29 NOTE — Assessment & Plan Note (Signed)
Appreciate PMR care  

## 2018-01-29 NOTE — Assessment & Plan Note (Signed)
Doing well with hydrocodone TID. Will refill today per pt request.

## 2018-01-29 NOTE — Assessment & Plan Note (Signed)
Chronic, sounds regular today. Continue metoprolol and aspirin daily.  Not on further anticoagulation due to high fall and bleeding risk.

## 2018-01-29 NOTE — Assessment & Plan Note (Signed)
Chronic, stable. Continue pravastatin 20mg  daily. The ASCVD Risk score Mikey Bussing DC Jr., et al., 2013) failed to calculate for the following reasons:   The 2013 ASCVD risk score is only valid for ages 22 to 39

## 2018-01-29 NOTE — Addendum Note (Signed)
Addended by: Ria Bush on: 01/29/2018 04:47 PM   Modules accepted: Level of Service

## 2018-01-30 ENCOUNTER — Other Ambulatory Visit: Payer: Self-pay | Admitting: Endocrinology

## 2018-01-30 DIAGNOSIS — M5442 Lumbago with sciatica, left side: Secondary | ICD-10-CM | POA: Diagnosis not present

## 2018-01-30 DIAGNOSIS — I5030 Unspecified diastolic (congestive) heart failure: Secondary | ICD-10-CM

## 2018-01-30 DIAGNOSIS — Z87891 Personal history of nicotine dependence: Secondary | ICD-10-CM

## 2018-01-30 DIAGNOSIS — I110 Hypertensive heart disease with heart failure: Secondary | ICD-10-CM | POA: Diagnosis not present

## 2018-01-30 DIAGNOSIS — M461 Sacroiliitis, not elsewhere classified: Secondary | ICD-10-CM | POA: Diagnosis not present

## 2018-01-30 DIAGNOSIS — M4186 Other forms of scoliosis, lumbar region: Secondary | ICD-10-CM

## 2018-01-30 DIAGNOSIS — Z993 Dependence on wheelchair: Secondary | ICD-10-CM

## 2018-01-30 DIAGNOSIS — I2510 Atherosclerotic heart disease of native coronary artery without angina pectoris: Secondary | ICD-10-CM

## 2018-01-30 DIAGNOSIS — M47816 Spondylosis without myelopathy or radiculopathy, lumbar region: Secondary | ICD-10-CM | POA: Diagnosis not present

## 2018-01-30 DIAGNOSIS — Z9181 History of falling: Secondary | ICD-10-CM

## 2018-01-30 DIAGNOSIS — M4807 Spinal stenosis, lumbosacral region: Secondary | ICD-10-CM | POA: Diagnosis not present

## 2018-01-30 DIAGNOSIS — Z7982 Long term (current) use of aspirin: Secondary | ICD-10-CM

## 2018-01-30 DIAGNOSIS — E1142 Type 2 diabetes mellitus with diabetic polyneuropathy: Secondary | ICD-10-CM

## 2018-01-30 DIAGNOSIS — M47814 Spondylosis without myelopathy or radiculopathy, thoracic region: Secondary | ICD-10-CM

## 2018-01-30 DIAGNOSIS — M5441 Lumbago with sciatica, right side: Secondary | ICD-10-CM | POA: Diagnosis not present

## 2018-01-30 DIAGNOSIS — I872 Venous insufficiency (chronic) (peripheral): Secondary | ICD-10-CM

## 2018-01-30 DIAGNOSIS — I480 Paroxysmal atrial fibrillation: Secondary | ICD-10-CM | POA: Diagnosis not present

## 2018-01-30 DIAGNOSIS — F411 Generalized anxiety disorder: Secondary | ICD-10-CM

## 2018-01-30 NOTE — Telephone Encounter (Signed)
Last OV-01/15/18 Last refilled-03/29/17  Rx request pioglitazone HCL 30 mg...Marland KitchenMarland KitchenPlease advise or denied if appropriate.

## 2018-01-31 ENCOUNTER — Telehealth: Payer: Self-pay | Admitting: *Deleted

## 2018-01-31 DIAGNOSIS — E1142 Type 2 diabetes mellitus with diabetic polyneuropathy: Secondary | ICD-10-CM | POA: Diagnosis not present

## 2018-01-31 DIAGNOSIS — M4807 Spinal stenosis, lumbosacral region: Secondary | ICD-10-CM | POA: Diagnosis not present

## 2018-01-31 DIAGNOSIS — M47816 Spondylosis without myelopathy or radiculopathy, lumbar region: Secondary | ICD-10-CM | POA: Diagnosis not present

## 2018-01-31 DIAGNOSIS — I2510 Atherosclerotic heart disease of native coronary artery without angina pectoris: Secondary | ICD-10-CM | POA: Diagnosis not present

## 2018-01-31 DIAGNOSIS — M5441 Lumbago with sciatica, right side: Secondary | ICD-10-CM | POA: Diagnosis not present

## 2018-01-31 DIAGNOSIS — M5442 Lumbago with sciatica, left side: Secondary | ICD-10-CM | POA: Diagnosis not present

## 2018-01-31 NOTE — Telephone Encounter (Signed)
Left message on vm for [name] of [facility] informing her Dr. Darnell Level is giving verbal orders for OT as requested for pt.

## 2018-01-31 NOTE — Telephone Encounter (Signed)
[name] with [facility] requesting verbal orders for OT 2xs per week for 1 week, then 1x per week for 1 wk.

## 2018-01-31 NOTE — Telephone Encounter (Signed)
Agree with this. Thank you.  

## 2018-02-01 DIAGNOSIS — M4807 Spinal stenosis, lumbosacral region: Secondary | ICD-10-CM | POA: Diagnosis not present

## 2018-02-01 DIAGNOSIS — M5441 Lumbago with sciatica, right side: Secondary | ICD-10-CM | POA: Diagnosis not present

## 2018-02-01 DIAGNOSIS — I2510 Atherosclerotic heart disease of native coronary artery without angina pectoris: Secondary | ICD-10-CM | POA: Diagnosis not present

## 2018-02-01 DIAGNOSIS — M5442 Lumbago with sciatica, left side: Secondary | ICD-10-CM | POA: Diagnosis not present

## 2018-02-01 DIAGNOSIS — E1142 Type 2 diabetes mellitus with diabetic polyneuropathy: Secondary | ICD-10-CM | POA: Diagnosis not present

## 2018-02-01 DIAGNOSIS — M47816 Spondylosis without myelopathy or radiculopathy, lumbar region: Secondary | ICD-10-CM | POA: Diagnosis not present

## 2018-02-02 DIAGNOSIS — M5441 Lumbago with sciatica, right side: Secondary | ICD-10-CM | POA: Diagnosis not present

## 2018-02-02 DIAGNOSIS — E1142 Type 2 diabetes mellitus with diabetic polyneuropathy: Secondary | ICD-10-CM | POA: Diagnosis not present

## 2018-02-02 DIAGNOSIS — M4807 Spinal stenosis, lumbosacral region: Secondary | ICD-10-CM | POA: Diagnosis not present

## 2018-02-02 DIAGNOSIS — I2510 Atherosclerotic heart disease of native coronary artery without angina pectoris: Secondary | ICD-10-CM | POA: Diagnosis not present

## 2018-02-02 DIAGNOSIS — M47816 Spondylosis without myelopathy or radiculopathy, lumbar region: Secondary | ICD-10-CM | POA: Diagnosis not present

## 2018-02-02 DIAGNOSIS — M5442 Lumbago with sciatica, left side: Secondary | ICD-10-CM | POA: Diagnosis not present

## 2018-02-05 ENCOUNTER — Ambulatory Visit: Payer: Medicare Other | Admitting: Podiatry

## 2018-02-05 DIAGNOSIS — M5442 Lumbago with sciatica, left side: Secondary | ICD-10-CM | POA: Diagnosis not present

## 2018-02-05 DIAGNOSIS — M5441 Lumbago with sciatica, right side: Secondary | ICD-10-CM | POA: Diagnosis not present

## 2018-02-05 DIAGNOSIS — M4807 Spinal stenosis, lumbosacral region: Secondary | ICD-10-CM | POA: Diagnosis not present

## 2018-02-05 DIAGNOSIS — M47816 Spondylosis without myelopathy or radiculopathy, lumbar region: Secondary | ICD-10-CM | POA: Diagnosis not present

## 2018-02-05 DIAGNOSIS — E1142 Type 2 diabetes mellitus with diabetic polyneuropathy: Secondary | ICD-10-CM | POA: Diagnosis not present

## 2018-02-05 DIAGNOSIS — I2510 Atherosclerotic heart disease of native coronary artery without angina pectoris: Secondary | ICD-10-CM | POA: Diagnosis not present

## 2018-02-06 ENCOUNTER — Other Ambulatory Visit: Payer: Self-pay | Admitting: Endocrinology

## 2018-02-07 DIAGNOSIS — I2510 Atherosclerotic heart disease of native coronary artery without angina pectoris: Secondary | ICD-10-CM | POA: Diagnosis not present

## 2018-02-07 DIAGNOSIS — M4807 Spinal stenosis, lumbosacral region: Secondary | ICD-10-CM | POA: Diagnosis not present

## 2018-02-07 DIAGNOSIS — M5442 Lumbago with sciatica, left side: Secondary | ICD-10-CM | POA: Diagnosis not present

## 2018-02-07 DIAGNOSIS — E1142 Type 2 diabetes mellitus with diabetic polyneuropathy: Secondary | ICD-10-CM | POA: Diagnosis not present

## 2018-02-07 DIAGNOSIS — M47816 Spondylosis without myelopathy or radiculopathy, lumbar region: Secondary | ICD-10-CM | POA: Diagnosis not present

## 2018-02-07 DIAGNOSIS — M5441 Lumbago with sciatica, right side: Secondary | ICD-10-CM | POA: Diagnosis not present

## 2018-02-08 DIAGNOSIS — M4807 Spinal stenosis, lumbosacral region: Secondary | ICD-10-CM | POA: Diagnosis not present

## 2018-02-08 DIAGNOSIS — E1142 Type 2 diabetes mellitus with diabetic polyneuropathy: Secondary | ICD-10-CM | POA: Diagnosis not present

## 2018-02-08 DIAGNOSIS — M47816 Spondylosis without myelopathy or radiculopathy, lumbar region: Secondary | ICD-10-CM | POA: Diagnosis not present

## 2018-02-08 DIAGNOSIS — M5441 Lumbago with sciatica, right side: Secondary | ICD-10-CM | POA: Diagnosis not present

## 2018-02-08 DIAGNOSIS — I2510 Atherosclerotic heart disease of native coronary artery without angina pectoris: Secondary | ICD-10-CM | POA: Diagnosis not present

## 2018-02-08 DIAGNOSIS — M5442 Lumbago with sciatica, left side: Secondary | ICD-10-CM | POA: Diagnosis not present

## 2018-02-09 DIAGNOSIS — H903 Sensorineural hearing loss, bilateral: Secondary | ICD-10-CM | POA: Diagnosis not present

## 2018-02-09 DIAGNOSIS — H6123 Impacted cerumen, bilateral: Secondary | ICD-10-CM | POA: Diagnosis not present

## 2018-02-12 ENCOUNTER — Telehealth: Payer: Self-pay

## 2018-02-12 DIAGNOSIS — M4807 Spinal stenosis, lumbosacral region: Secondary | ICD-10-CM | POA: Diagnosis not present

## 2018-02-12 DIAGNOSIS — M5442 Lumbago with sciatica, left side: Secondary | ICD-10-CM | POA: Diagnosis not present

## 2018-02-12 DIAGNOSIS — E1142 Type 2 diabetes mellitus with diabetic polyneuropathy: Secondary | ICD-10-CM | POA: Diagnosis not present

## 2018-02-12 DIAGNOSIS — M5441 Lumbago with sciatica, right side: Secondary | ICD-10-CM | POA: Diagnosis not present

## 2018-02-12 DIAGNOSIS — M47816 Spondylosis without myelopathy or radiculopathy, lumbar region: Secondary | ICD-10-CM | POA: Diagnosis not present

## 2018-02-12 DIAGNOSIS — I2510 Atherosclerotic heart disease of native coronary artery without angina pectoris: Secondary | ICD-10-CM | POA: Diagnosis not present

## 2018-02-12 NOTE — Telephone Encounter (Signed)
Noted  

## 2018-02-12 NOTE — Telephone Encounter (Signed)
Mila Merry, with [facility], called to let us know that there was 1 missed appointment with patient on 02/09/2018 due to patient's schedule conflict. CB number for [name] if needed is [phone]. Thank you

## 2018-02-13 ENCOUNTER — Ambulatory Visit: Payer: Medicare Other | Admitting: Podiatry

## 2018-02-13 DIAGNOSIS — M5441 Lumbago with sciatica, right side: Secondary | ICD-10-CM | POA: Diagnosis not present

## 2018-02-13 DIAGNOSIS — E1142 Type 2 diabetes mellitus with diabetic polyneuropathy: Secondary | ICD-10-CM | POA: Diagnosis not present

## 2018-02-13 DIAGNOSIS — I2510 Atherosclerotic heart disease of native coronary artery without angina pectoris: Secondary | ICD-10-CM | POA: Diagnosis not present

## 2018-02-13 DIAGNOSIS — M5442 Lumbago with sciatica, left side: Secondary | ICD-10-CM | POA: Diagnosis not present

## 2018-02-13 DIAGNOSIS — M4807 Spinal stenosis, lumbosacral region: Secondary | ICD-10-CM | POA: Diagnosis not present

## 2018-02-13 DIAGNOSIS — M47816 Spondylosis without myelopathy or radiculopathy, lumbar region: Secondary | ICD-10-CM | POA: Diagnosis not present

## 2018-02-13 NOTE — Progress Notes (Signed)
I reviewed health advisor's note, was available for consultation, and agree with documentation and plan.  

## 2018-02-14 ENCOUNTER — Other Ambulatory Visit: Payer: Self-pay | Admitting: Family Medicine

## 2018-02-14 DIAGNOSIS — H353131 Nonexudative age-related macular degeneration, bilateral, early dry stage: Secondary | ICD-10-CM | POA: Diagnosis not present

## 2018-02-15 DIAGNOSIS — E1142 Type 2 diabetes mellitus with diabetic polyneuropathy: Secondary | ICD-10-CM | POA: Diagnosis not present

## 2018-02-15 DIAGNOSIS — M5441 Lumbago with sciatica, right side: Secondary | ICD-10-CM | POA: Diagnosis not present

## 2018-02-15 DIAGNOSIS — M47816 Spondylosis without myelopathy or radiculopathy, lumbar region: Secondary | ICD-10-CM | POA: Diagnosis not present

## 2018-02-15 DIAGNOSIS — I2510 Atherosclerotic heart disease of native coronary artery without angina pectoris: Secondary | ICD-10-CM | POA: Diagnosis not present

## 2018-02-15 DIAGNOSIS — M5442 Lumbago with sciatica, left side: Secondary | ICD-10-CM | POA: Diagnosis not present

## 2018-02-15 DIAGNOSIS — M4807 Spinal stenosis, lumbosacral region: Secondary | ICD-10-CM | POA: Diagnosis not present

## 2018-02-16 ENCOUNTER — Other Ambulatory Visit: Payer: Self-pay | Admitting: Family Medicine

## 2018-02-16 ENCOUNTER — Telehealth: Payer: Self-pay | Admitting: Family Medicine

## 2018-02-16 DIAGNOSIS — M4807 Spinal stenosis, lumbosacral region: Secondary | ICD-10-CM | POA: Diagnosis not present

## 2018-02-16 DIAGNOSIS — E1142 Type 2 diabetes mellitus with diabetic polyneuropathy: Secondary | ICD-10-CM | POA: Diagnosis not present

## 2018-02-16 DIAGNOSIS — M5442 Lumbago with sciatica, left side: Secondary | ICD-10-CM | POA: Diagnosis not present

## 2018-02-16 DIAGNOSIS — M5441 Lumbago with sciatica, right side: Secondary | ICD-10-CM | POA: Diagnosis not present

## 2018-02-16 DIAGNOSIS — M47816 Spondylosis without myelopathy or radiculopathy, lumbar region: Secondary | ICD-10-CM | POA: Diagnosis not present

## 2018-02-16 DIAGNOSIS — I2510 Atherosclerotic heart disease of native coronary artery without angina pectoris: Secondary | ICD-10-CM | POA: Diagnosis not present

## 2018-02-16 NOTE — Telephone Encounter (Signed)
Stacey/[facility] called office requesting to extend the PT. The pt is not making the progress she expected due to pt self limiting herself. Best cb # for [name] [phone].

## 2018-02-16 NOTE — Telephone Encounter (Signed)
[name], with [facility], informing her Dr. Darnell Level gives verbal orders to extend PT.  Verbalizes understanding.

## 2018-02-16 NOTE — Telephone Encounter (Signed)
Ok to extend treatment. Thanks.

## 2018-02-20 DIAGNOSIS — M47816 Spondylosis without myelopathy or radiculopathy, lumbar region: Secondary | ICD-10-CM | POA: Diagnosis not present

## 2018-02-20 DIAGNOSIS — M5442 Lumbago with sciatica, left side: Secondary | ICD-10-CM | POA: Diagnosis not present

## 2018-02-20 DIAGNOSIS — M4807 Spinal stenosis, lumbosacral region: Secondary | ICD-10-CM | POA: Diagnosis not present

## 2018-02-20 DIAGNOSIS — M5441 Lumbago with sciatica, right side: Secondary | ICD-10-CM | POA: Diagnosis not present

## 2018-02-20 DIAGNOSIS — I2510 Atherosclerotic heart disease of native coronary artery without angina pectoris: Secondary | ICD-10-CM | POA: Diagnosis not present

## 2018-02-20 DIAGNOSIS — E1142 Type 2 diabetes mellitus with diabetic polyneuropathy: Secondary | ICD-10-CM | POA: Diagnosis not present

## 2018-02-22 DIAGNOSIS — I2510 Atherosclerotic heart disease of native coronary artery without angina pectoris: Secondary | ICD-10-CM | POA: Diagnosis not present

## 2018-02-22 DIAGNOSIS — M5441 Lumbago with sciatica, right side: Secondary | ICD-10-CM | POA: Diagnosis not present

## 2018-02-22 DIAGNOSIS — M5442 Lumbago with sciatica, left side: Secondary | ICD-10-CM | POA: Diagnosis not present

## 2018-02-22 DIAGNOSIS — M47816 Spondylosis without myelopathy or radiculopathy, lumbar region: Secondary | ICD-10-CM | POA: Diagnosis not present

## 2018-02-22 DIAGNOSIS — M4807 Spinal stenosis, lumbosacral region: Secondary | ICD-10-CM | POA: Diagnosis not present

## 2018-02-22 DIAGNOSIS — E1142 Type 2 diabetes mellitus with diabetic polyneuropathy: Secondary | ICD-10-CM | POA: Diagnosis not present

## 2018-02-26 DIAGNOSIS — M5136 Other intervertebral disc degeneration, lumbar region: Secondary | ICD-10-CM | POA: Diagnosis not present

## 2018-02-26 DIAGNOSIS — M48062 Spinal stenosis, lumbar region with neurogenic claudication: Secondary | ICD-10-CM | POA: Diagnosis not present

## 2018-02-26 DIAGNOSIS — M5416 Radiculopathy, lumbar region: Secondary | ICD-10-CM | POA: Diagnosis not present

## 2018-02-27 ENCOUNTER — Other Ambulatory Visit: Payer: Self-pay | Admitting: Family Medicine

## 2018-02-27 ENCOUNTER — Telehealth: Payer: Self-pay

## 2018-02-27 DIAGNOSIS — M47816 Spondylosis without myelopathy or radiculopathy, lumbar region: Secondary | ICD-10-CM | POA: Diagnosis not present

## 2018-02-27 DIAGNOSIS — M5442 Lumbago with sciatica, left side: Secondary | ICD-10-CM | POA: Diagnosis not present

## 2018-02-27 DIAGNOSIS — M4807 Spinal stenosis, lumbosacral region: Secondary | ICD-10-CM | POA: Diagnosis not present

## 2018-02-27 DIAGNOSIS — M5441 Lumbago with sciatica, right side: Secondary | ICD-10-CM | POA: Diagnosis not present

## 2018-02-27 DIAGNOSIS — E1142 Type 2 diabetes mellitus with diabetic polyneuropathy: Secondary | ICD-10-CM | POA: Diagnosis not present

## 2018-02-27 DIAGNOSIS — I2510 Atherosclerotic heart disease of native coronary artery without angina pectoris: Secondary | ICD-10-CM | POA: Diagnosis not present

## 2018-02-27 NOTE — Telephone Encounter (Signed)
[facility]; pt last annual exam on 01/29/18. Refilled per protocol to [facility].

## 2018-02-27 NOTE — Telephone Encounter (Signed)
noted 

## 2018-02-27 NOTE — Telephone Encounter (Signed)
Amy nurse with [facility] saw pt earlier today. Pt has an appt with Dr Darnell Level on 02/28/18 at 3:30 and Amy wanted to send FYI to Dr Darnell Level prior to pt coming in for appt. Pt went for cortisone injection in spine on 02/26/18. Today pt has facial flushing, cheeks are rosy but no fever. Pt thought she had fever, had not taken tylenol and Amy took temp at 97.7. pts rt leg on inner leg has area where dog bit pt; on outer rt leg pt has cut where pt dropped a knife and cut herself; there is no redness, warmth or signs of cellulitis but inner lower rt leg is weeping.  Amy applied Xeorform gauze dressing with a dry dressing. Pt encouraged to wear support hose and keep legs elevated when sitting. FYI sent to Dr Darnell Level and Lattie Haw CMA.

## 2018-02-28 ENCOUNTER — Telehealth: Payer: Self-pay | Admitting: Family Medicine

## 2018-02-28 ENCOUNTER — Ambulatory Visit (INDEPENDENT_AMBULATORY_CARE_PROVIDER_SITE_OTHER): Payer: Medicare Other | Admitting: Family Medicine

## 2018-02-28 ENCOUNTER — Encounter: Payer: Self-pay | Admitting: Family Medicine

## 2018-02-28 VITALS — BP 132/70 | HR 75 | Temp 98.2°F | Ht <= 58 in | Wt 144.5 lb

## 2018-02-28 DIAGNOSIS — M5441 Lumbago with sciatica, right side: Secondary | ICD-10-CM

## 2018-02-28 DIAGNOSIS — S81801A Unspecified open wound, right lower leg, initial encounter: Secondary | ICD-10-CM | POA: Diagnosis not present

## 2018-02-28 DIAGNOSIS — I83212 Varicose veins of right lower extremity with both ulcer of calf and inflammation: Secondary | ICD-10-CM

## 2018-02-28 DIAGNOSIS — L97211 Non-pressure chronic ulcer of right calf limited to breakdown of skin: Secondary | ICD-10-CM | POA: Diagnosis not present

## 2018-02-28 DIAGNOSIS — M47816 Spondylosis without myelopathy or radiculopathy, lumbar region: Secondary | ICD-10-CM | POA: Diagnosis not present

## 2018-02-28 DIAGNOSIS — M5442 Lumbago with sciatica, left side: Secondary | ICD-10-CM

## 2018-02-28 DIAGNOSIS — M4807 Spinal stenosis, lumbosacral region: Secondary | ICD-10-CM | POA: Diagnosis not present

## 2018-02-28 DIAGNOSIS — I2510 Atherosclerotic heart disease of native coronary artery without angina pectoris: Secondary | ICD-10-CM | POA: Diagnosis not present

## 2018-02-28 DIAGNOSIS — E1142 Type 2 diabetes mellitus with diabetic polyneuropathy: Secondary | ICD-10-CM | POA: Diagnosis not present

## 2018-02-28 DIAGNOSIS — G8929 Other chronic pain: Secondary | ICD-10-CM | POA: Diagnosis not present

## 2018-02-28 NOTE — Patient Instructions (Signed)
Continue dressing changes daily for both wounds on right leg.  Return next week for recheck.  We will check on rolling walker.

## 2018-02-28 NOTE — Progress Notes (Signed)
BP 132/70 (BP Location: Left Arm, Patient Position: Sitting, Cuff Size: Normal)   Pulse 75   Temp 98.2 F (36.8 C) (Oral)   Ht 4\' 9"  (1.448 m)   Wt 144 lb 8 oz (65.5 kg)   SpO2 97%   BMI 31.27 kg/m    CC: f/u back pain, new leg wound Subjective:    [name], female    DOB: Nov 18, 1927, 84 y.o.   MRN: [id number]  HPI: [name] is a 84 y.o. female presenting on 02/28/2018 for Back Pain (Pt states low back pain is not improving despite everything she has tried. Recently had inj. Pt is accompanied by her daughter-in-law, Jenny Reichmann. ); Wound (Has wound on right leg that is oozing. [place] nurse noticed it today and told pt to ask doctor for patches. ); and Leg Swelling (C/o bilateral leg swelling. )   Progressive R LBP with R radiculopathy since 12/2017. Pain stemming from L L5/S1 HNP and neurogenic claudication from L4/5 spinal stenosis. Has seen PMR and received transforminal epidural steroid injections x 2, latest Monday 11/25 (bilateral S1 transforaminal epidural injections). Also taking hydrocodone 5/325mg  TID scheduled along with tylenol 500mg  PRN. Ongoing back pain.  We received a call from Amy with [facility] yesterday - pt had dog scratch R outer leg and skin cut R inner leg (dropped knife). Skin of lower leg is weeping - using support hose ].   Relevant past medical, surgical, family and social history reviewed and updated as indicated. Interim medical history since our last visit reviewed. Allergies and medications reviewed and updated. Outpatient Medications Prior to Visit  Medication Sig Dispense Refill  . acetaminophen (TYLENOL) 500 MG tablet Take 500 mg by mouth every 6 (six) hours as needed for mild pain.    Marland Kitchen ALPRAZolam (XANAX) 1 MG tablet Take 0.5 mg by mouth 4 (four) times daily as needed for anxiety.     Marland Kitchen aspirin EC 81 MG tablet Take 81 mg by mouth daily.    Marland Kitchen DEXILANT 30 MG capsule TAKE 1 CAPSULE (30 MG TOTAL) BY MOUTH DAILY. 30 capsule 11  . diclofenac  sodium (VOLTAREN) 1 % GEL Apply 1 application topically 3 (three) times daily. 1 Tube 1  . famotidine (PEPCID) 20 MG tablet Take 1 tablet (20 mg total) by mouth at bedtime.    . furosemide (LASIX) 20 MG tablet TAKE 0.5 TABLETS (10 MG TOTAL) BY MOUTH DAILY. 45 tablet 3  . glucosamine-chondroitin 500-400 MG tablet Take 1 tablet by mouth daily.     Marland Kitchen glucose blood (ONE TOUCH ULTRA TEST) test strip USE AS INSTRUCTED TO CHECK BLOOD SUGAR ONCE A DAY Dx code: E11.9 50 each 5  . HYDROcodone-acetaminophen (NORCO/VICODIN) 5-325 MG tablet Take 1 tablet by mouth 3 (three) times daily as needed for moderate pain. 90 tablet 0  . JANUVIA 100 MG tablet TAKE 1 TABLET BY MOUTH EVERY DAY 30 tablet 1  . loperamide (IMODIUM) 2 MG capsule Take 2 mg by mouth 4 (four) times daily as needed for diarrhea or loose stools.    Marland Kitchen loratadine (CLARITIN) 10 MG tablet Take 10 mg by mouth daily as needed for allergies.    . methocarbamol (ROBAXIN) 500 MG tablet Take 0.5-1 tablets (250-500 mg total) by mouth 3 (three) times daily as needed for muscle spasms. 20 tablet 0  . metoprolol tartrate (LOPRESSOR) 25 MG tablet TAKE ONE AND HALF TABLETS (37.5 MG TOTAL) BY MOUTH 2 (TWO) TIMES DAILY. 270 tablet 1  .  Multiple Vitamin (MULTIVITAMIN) tablet Take 1 tablet by mouth daily. Patient reports takes a new vitamin to replace her centrum as recommended by her eye doctor.    . nitroGLYCERIN (NITROSTAT) 0.4 MG SL tablet Place 1 tablet (0.4 mg total) under the tongue every 5 (five) minutes as needed for chest pain. 25 tablet 3  . nystatin-triamcinolone ointment (MYCOLOG) APPLY 1 APPLICATION TOPICALLY 2 (TWO) TIMES DAILY. AS NEEDED FOR ITCHING 60 g 1  . ONETOUCH VERIO test strip USE TO TEST BLOOD SUGAR ONCE DAILY 100 each 6  . ONETOUCH VERIO test strip USE TO TEST BLOOD SUGAR ONCE DAILY 100 each 6  . pioglitazone (ACTOS) 30 MG tablet TAKE 1 TABLET BY MOUTH EVERY DAY 90 tablet 3  . pravastatin (PRAVACHOL) 20 MG tablet TAKE 1 TABLET BY MOUTH EVERY  DAY 90 tablet 3  . predniSONE (DELTASONE) 20 MG tablet Take two tablets daily for 3 days followed by one tablet daily for 4 days 10 tablet 0  . Probiotic Product (ALIGN) 4 MG CAPS Take 1 capsule (4 mg total) by mouth daily.    . ramipril (ALTACE) 5 MG capsule TAKE ONE CAPSULE BY MOUTH EVERYDAY FOR BLOOD PRESSURE 90 capsule 1  . ramipril (ALTACE) 5 MG capsule TAKE ONE CAPSULE BY MOUTH EVERYDAY FOR BLOOD PRESSURE 90 capsule 3  . traZODone (DESYREL) 150 MG tablet Take 150 mg by mouth at bedtime.     . triamcinolone cream (KENALOG) 0.1 % APPLY 1 APPLICATION TOPICALLY 2 (TWO) TIMES DAILY.  1  . vitamin B-12 (CYANOCOBALAMIN) 1000 MCG tablet Take 1,000 mcg by mouth daily.    . vitamin E 400 UNIT capsule Take 400 Units by mouth daily.     No facility-administered medications prior to visit.      Per HPI unless specifically indicated in ROS section below Review of Systems     Objective:    BP 132/70 (BP Location: Left Arm, Patient Position: Sitting, Cuff Size: Normal)   Pulse 75   Temp 98.2 F (36.8 C) (Oral)   Ht 4\' 9"  (1.448 m)   Wt 144 lb 8 oz (65.5 kg)   SpO2 97%   BMI 31.27 kg/m   Wt Readings from Last 3 Encounters:  02/28/18 144 lb 8 oz (65.5 kg)  01/29/18 144 lb 4 oz (65.4 kg)  01/24/18 145 lb (65.8 kg)    Physical Exam  Constitutional: She appears well-developed and well-nourished. No distress.  In wheelchair today.   Musculoskeletal: She exhibits edema (1+ R>L).  Chronic venous insufficiency changes  Skin: Skin is warm and dry. No erythema.  RLE - lateral mid leg is dry shallow venous ulcer. Medial mid leg there is weeping abrasion with skin that is well approximated.  No erythema of lower legs.   Psychiatric: She has a normal mood and affect.  Nursing note and vitals reviewed.  RLE wounds dressed - lateral dry one with petroleum gauze dressing, medial weeping wound with dry non stick gauze, then wraped in kerlex and support hose placed over dressing. Home instructions  provided.  While removing initial dressing, I knicked anterior leg with scissors causing small cut in skin.     Assessment & Plan:  Pt states she has still not received new rolling walker (her current one is broken). We faxed a request to [facility] on 01/25/2018. I have asked Lattie Haw to check on this for patient.  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 Pt refused unna boots. Will ask wound nurse to come out to house over long weekend to continue treatment - [facility] referral placed. RTC 1 wk f/u visit. Encouraged leg elevation, continue compression stocking use.       Spinal stenosis of lumbosacral region   Leg wound, right, initial encounter    New wounds to RLE. Dressed, home care instructions provided      Encounter for chronic pain management    Ongoing pain despite recent increase in hydrocodone to TID. Hesitant to increase past this dose due to some increased sedation noted. Continue PMR f/u - appreciate their care of patient.       Chronic lower back pain       No orders of the defined types were placed in this encounter.  No orders of the defined types were placed in this encounter.   Follow up plan: Return in about 1 week (around 03/07/2018), or if symptoms worsen or fail to improve.  Ria Bush, MD

## 2018-02-28 NOTE — Telephone Encounter (Signed)
Called to move 12/10 appt sooner. Ok to take same day to make 30 min per Lattie Haw.

## 2018-03-03 NOTE — Assessment & Plan Note (Signed)
New wounds to RLE. Dressed, home care instructions provided

## 2018-03-03 NOTE — Assessment & Plan Note (Signed)
Ongoing pain despite recent increase in hydrocodone to TID. Hesitant to increase past this dose due to some increased sedation noted. Continue PMR f/u - appreciate their care of patient.

## 2018-03-03 NOTE — Assessment & Plan Note (Signed)
2 new wounds to RLE - dressed in office. Pt refused unna boots. Will ask wound nurse to come out to house over long weekend to continue treatment - [facility] referral placed. RTC 1 wk f/u visit. Encouraged leg elevation, continue compression stocking use.

## 2018-03-06 DIAGNOSIS — I2510 Atherosclerotic heart disease of native coronary artery without angina pectoris: Secondary | ICD-10-CM | POA: Diagnosis not present

## 2018-03-06 DIAGNOSIS — M4807 Spinal stenosis, lumbosacral region: Secondary | ICD-10-CM | POA: Diagnosis not present

## 2018-03-06 DIAGNOSIS — E1142 Type 2 diabetes mellitus with diabetic polyneuropathy: Secondary | ICD-10-CM | POA: Diagnosis not present

## 2018-03-06 DIAGNOSIS — M5441 Lumbago with sciatica, right side: Secondary | ICD-10-CM | POA: Diagnosis not present

## 2018-03-06 DIAGNOSIS — M5442 Lumbago with sciatica, left side: Secondary | ICD-10-CM | POA: Diagnosis not present

## 2018-03-06 DIAGNOSIS — M47816 Spondylosis without myelopathy or radiculopathy, lumbar region: Secondary | ICD-10-CM | POA: Diagnosis not present

## 2018-03-07 ENCOUNTER — Other Ambulatory Visit: Payer: Self-pay

## 2018-03-07 MED ORDER — HYDROCODONE-ACETAMINOPHEN 5-325 MG PO TABS: 1.0000 | ORAL_TABLET | Freq: Three times a day (TID) | ORAL | 0 refills | Status: DC | PRN

## 2018-03-07 NOTE — Telephone Encounter (Signed)
Patient notified by telephone that script has been sent in to the pharmacy.

## 2018-03-07 NOTE — Telephone Encounter (Signed)
plz notify this was sent in. 

## 2018-03-07 NOTE — Telephone Encounter (Signed)
Name of Medication: Hydrocodone apap 5-325 Name of Pharmacy: [facility] or Written Date and Quantity: # 50 on 01/29/18 Last Office Visit and Type: 02/28/18 back pain Next Office Visit and Type: 03/13/18 Last Controlled Substance Agreement Date: 10/16/17 Last UDS:10/11/17.  Pt will be out of med on 03/08/18. Pt is aware of calling early for pain med but pt forgot.

## 2018-03-08 ENCOUNTER — Other Ambulatory Visit: Payer: Self-pay

## 2018-03-08 ENCOUNTER — Telehealth: Payer: Self-pay

## 2018-03-08 MED ORDER — SITAGLIPTIN PHOSPHATE 100 MG PO TABS: 100.0000 mg | ORAL_TABLET | Freq: Every day | ORAL | 2 refills | Status: DC

## 2018-03-08 NOTE — Telephone Encounter (Signed)
Noted  

## 2018-03-08 NOTE — Telephone Encounter (Signed)
Faxed rx to [facility].

## 2018-03-08 NOTE — Telephone Encounter (Signed)
Betsy/[facility] returned call about the Rollator walker. She stated they have not received a order and to resend it through Epic to [facility] or fax it over to [phone]. Best cb # is [phone]

## 2018-03-08 NOTE — Telephone Encounter (Signed)
Spoke with [facility] asking about status of faxed rx for Rollator walker with seat for pt.  Says they will check into it and call back.

## 2018-03-08 NOTE — Telephone Encounter (Signed)
Rx written and in Lisa's box.  

## 2018-03-13 ENCOUNTER — Encounter: Payer: Self-pay | Admitting: Family Medicine

## 2018-03-13 ENCOUNTER — Ambulatory Visit (INDEPENDENT_AMBULATORY_CARE_PROVIDER_SITE_OTHER): Payer: Medicare Other | Admitting: Family Medicine

## 2018-03-13 VITALS — BP 136/68 | HR 80 | Temp 98.4°F | Ht <= 58 in | Wt 143.0 lb

## 2018-03-13 DIAGNOSIS — L97211 Non-pressure chronic ulcer of right calf limited to breakdown of skin: Secondary | ICD-10-CM

## 2018-03-13 DIAGNOSIS — I83212 Varicose veins of right lower extremity with both ulcer of calf and inflammation: Secondary | ICD-10-CM

## 2018-03-13 NOTE — Progress Notes (Signed)
BP 136/68 (BP Location: Left Arm, Patient Position: Sitting, Cuff Size: Normal)   Pulse 80   Temp 98.4 F (36.9 C) (Oral)   Ht 4\' 9"  (1.448 m)   Wt 143 lb (64.9 kg)   SpO2 97%   BMI 30.94 kg/m    CC: f/u visit Subjective:    [name], female    DOB: Nov 22, 1927, 84 y.o.   MRN: [id number]  HPI: [name] is a 84 y.o. female presenting on 03/13/2018 for Follow-up (Here for 4-6 wk f/u. States right leg is leaking. Pt accompanied by her daughter-in-law, Jenny Reichmann. Per Jenny Reichmann, pt has not been walking or elevating leg as recommended. )   See prior note for details. Medial and lateral wounds to RLE treated with daily dressing changes as patient refused unna boot.  She has not been walking. She has not elevated leg.   [place] wound nurse did not come.   DIL noticing some increased confusion with recent increase in hydrocodone.   Relevant past medical, surgical, family and social history reviewed and updated as indicated. Interim medical history since our last visit reviewed. Allergies and medications reviewed and updated. Outpatient Medications Prior to Visit  Medication Sig Dispense Refill  . acetaminophen (TYLENOL) 500 MG tablet Take 500 mg by mouth every 6 (six) hours as needed for mild pain.    Marland Kitchen ALPRAZolam (XANAX) 1 MG tablet Take 0.5 mg by mouth 4 (four) times daily as needed for anxiety.     Marland Kitchen aspirin EC 81 MG tablet Take 81 mg by mouth daily.    Marland Kitchen DEXILANT 30 MG capsule TAKE 1 CAPSULE (30 MG TOTAL) BY MOUTH DAILY. 30 capsule 11  . diclofenac sodium (VOLTAREN) 1 % GEL Apply 1 application topically 3 (three) times daily. 1 Tube 1  . famotidine (PEPCID) 20 MG tablet Take 1 tablet (20 mg total) by mouth at bedtime.    . furosemide (LASIX) 20 MG tablet TAKE 0.5 TABLETS (10 MG TOTAL) BY MOUTH DAILY. 45 tablet 3  . glucosamine-chondroitin 500-400 MG tablet Take 1 tablet by mouth daily.     Marland Kitchen glucose blood (ONE TOUCH ULTRA TEST) test strip USE AS INSTRUCTED TO CHECK  BLOOD SUGAR ONCE A DAY Dx code: E11.9 50 each 5  . HYDROcodone-acetaminophen (NORCO/VICODIN) 5-325 MG tablet Take 1 tablet by mouth 3 (three) times daily as needed for moderate pain. 90 tablet 0  . loperamide (IMODIUM) 2 MG capsule Take 2 mg by mouth 4 (four) times daily as needed for diarrhea or loose stools.    Marland Kitchen loratadine (CLARITIN) 10 MG tablet Take 10 mg by mouth daily as needed for allergies.    . methocarbamol (ROBAXIN) 500 MG tablet Take 0.5-1 tablets (250-500 mg total) by mouth 3 (three) times daily as needed for muscle spasms. 20 tablet 0  . metoprolol tartrate (LOPRESSOR) 25 MG tablet TAKE ONE AND HALF TABLETS (37.5 MG TOTAL) BY MOUTH 2 (TWO) TIMES DAILY. 270 tablet 1  . Multiple Vitamin (MULTIVITAMIN) tablet Take 1 tablet by mouth daily. Patient reports takes a new vitamin to replace her centrum as recommended by her eye doctor.    . nitroGLYCERIN (NITROSTAT) 0.4 MG SL tablet Place 1 tablet (0.4 mg total) under the tongue every 5 (five) minutes as needed for chest pain. 25 tablet 3  . nystatin-triamcinolone ointment (MYCOLOG) APPLY 1 APPLICATION TOPICALLY 2 (TWO) TIMES DAILY. AS NEEDED FOR ITCHING 60 g 1  . ONETOUCH VERIO test strip USE TO TEST BLOOD SUGAR  ONCE DAILY 100 each 6  . ONETOUCH VERIO test strip USE TO TEST BLOOD SUGAR ONCE DAILY 100 each 6  . pioglitazone (ACTOS) 30 MG tablet TAKE 1 TABLET BY MOUTH EVERY DAY 90 tablet 3  . pravastatin (PRAVACHOL) 20 MG tablet TAKE 1 TABLET BY MOUTH EVERY DAY 90 tablet 3  . predniSONE (DELTASONE) 20 MG tablet Take two tablets daily for 3 days followed by one tablet daily for 4 days 10 tablet 0  . Probiotic Product (ALIGN) 4 MG CAPS Take 1 capsule (4 mg total) by mouth daily.    . ramipril (ALTACE) 5 MG capsule TAKE ONE CAPSULE BY MOUTH EVERYDAY FOR BLOOD PRESSURE 90 capsule 1  . ramipril (ALTACE) 5 MG capsule TAKE ONE CAPSULE BY MOUTH EVERYDAY FOR BLOOD PRESSURE 90 capsule 3  . sitaGLIPtin (JANUVIA) 100 MG tablet Take 1 tablet (100 mg total)  by mouth daily. 30 tablet 2  . traZODone (DESYREL) 150 MG tablet Take 150 mg by mouth at bedtime.     . triamcinolone cream (KENALOG) 0.1 % APPLY 1 APPLICATION TOPICALLY 2 (TWO) TIMES DAILY.  1  . vitamin B-12 (CYANOCOBALAMIN) 1000 MCG tablet Take 1,000 mcg by mouth daily.    . vitamin E 400 UNIT capsule Take 400 Units by mouth daily.     No facility-administered medications prior to visit.      Per HPI unless specifically indicated in ROS section below Review of Systems     Objective:    BP 136/68 (BP Location: Left Arm, Patient Position: Sitting, Cuff Size: Normal)   Pulse 80   Temp 98.4 F (36.9 C) (Oral)   Ht 4\' 9"  (1.448 m)   Wt 143 lb (64.9 kg)   SpO2 97%   BMI 30.94 kg/m   Wt Readings from Last 3 Encounters:  03/13/18 143 lb (64.9 kg)  02/28/18 144 lb 8 oz (65.5 kg)  01/29/18 144 lb 4 oz (65.4 kg)    Physical Exam  Constitutional: She appears well-developed and well-nourished. No distress.  Cardiovascular: Regular rhythm.  Musculoskeletal: She exhibits edema (1+ pedal RLE).  Skin: Skin is warm and dry. No erythema.  RLE medial wound smaller in size, still draining transudate.  RLE lateral 1.5x1 cm shallow ulcer without surrounding erythema or drainage  Nursing note and vitals reviewed.       Wound dressed with non adherent gauze and kerlex dressing, pt tolerated well.   Results for orders placed or performed in visit on 01/24/18  Comprehensive metabolic panel  Result Value Ref Range   Sodium 135 135 - 145 mEq/L   Potassium 4.8 3.5 - 5.1 mEq/L   Chloride 98 96 - 112 mEq/L   CO2 31 19 - 32 mEq/L   Glucose, Bld 161 (H) 70 - 99 mg/dL   BUN 20 6 - 23 mg/dL   Creatinine, Ser 0.90 0.40 - 1.20 mg/dL   Total Bilirubin 0.6 0.2 - 1.2 mg/dL   Alkaline Phosphatase 110 39 - 117 U/L   AST 19 0 - 37 U/L   ALT 16 0 - 35 U/L   Total Protein 7.1 6.0 - 8.3 g/dL   Albumin 3.7 3.5 - 5.2 g/dL   Calcium 9.6 8.4 - 10.5 mg/dL   GFR 62.71 >60.00 mL/min  Lipid Panel  Result  Value Ref Range   Cholesterol 124 0 - 200 mg/dL   Triglycerides 69.0 0.0 - 149.0 mg/dL   HDL 53.80 >39.00 mg/dL   VLDL 13.8 0.0 - 40.0 mg/dL   LDL  Cholesterol 56 0 - 99 mg/dL   Total CHOL/HDL Ratio 2    NonHDL 70.12       Assessment & Plan:   Problem List Items Addressed This Visit    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 Will refer to wound clinic. Continue daily dressing changes. RTC 1 wk wound f/u, unless wound clinic appt is able to be expedited sooner.       Relevant Orders   AMB referral to wound care center       No orders of the defined types were placed in this encounter.  Orders Placed This Encounter  Procedures  . AMB referral to wound care center    Referral Priority:   Routine    Referral Type:   Consultation    Number of Visits Requested:   1    Follow up plan: Return in about 1 week (around 03/20/2018) for follow up visit.  Ria Bush, MD

## 2018-03-13 NOTE — Patient Instructions (Addendum)
Continue daily dressing changes See Rosaria Ferries for referral back to wound clinic.  Decrease pain medication to 1 tablet twice daily with extra 1/2 tablet as needed.

## 2018-03-13 NOTE — Assessment & Plan Note (Signed)
Medial wound is improving, decreasing in size, however lateral wound may be enlarging. Will refer to wound clinic. Continue daily dressing changes. RTC 1 wk wound f/u, unless wound clinic appt is able to be expedited sooner.

## 2018-03-14 ENCOUNTER — Telehealth: Payer: Self-pay | Admitting: Family Medicine

## 2018-03-14 ENCOUNTER — Telehealth: Payer: Self-pay

## 2018-03-14 DIAGNOSIS — M5441 Lumbago with sciatica, right side: Secondary | ICD-10-CM | POA: Diagnosis not present

## 2018-03-14 DIAGNOSIS — M4807 Spinal stenosis, lumbosacral region: Secondary | ICD-10-CM | POA: Diagnosis not present

## 2018-03-14 DIAGNOSIS — E1142 Type 2 diabetes mellitus with diabetic polyneuropathy: Secondary | ICD-10-CM | POA: Diagnosis not present

## 2018-03-14 DIAGNOSIS — I2510 Atherosclerotic heart disease of native coronary artery without angina pectoris: Secondary | ICD-10-CM | POA: Diagnosis not present

## 2018-03-14 DIAGNOSIS — M5442 Lumbago with sciatica, left side: Secondary | ICD-10-CM | POA: Diagnosis not present

## 2018-03-14 DIAGNOSIS — M47816 Spondylosis without myelopathy or radiculopathy, lumbar region: Secondary | ICD-10-CM | POA: Diagnosis not present

## 2018-03-14 NOTE — Telephone Encounter (Signed)
Spoke with [name], of [facility], informing her Dr. Darnell Level is giving verbal orders for services requested.  Also, relayed Dr. Synthia Innocent message for wound care.  Sonya verbalizes understanding.

## 2018-03-14 NOTE — Telephone Encounter (Signed)
Mitzi Hansen nurse [facility] said pt is up for recertification and due to wounds and swelling of pts legs sonya request v/o for [facility] nursing 1 x a week for 1 week; 2 x a week for 1 week and 1 x a week for 5 weeks. Davy Pique wants to know if should continue the daily neosporin, non stick pad and wrap. Also pt has appt with Dr Dellia Nims at wound center on 03/23/18; will it be OK with Dr Darnell Level to follow the orders from Dr Dellia Nims at wound care after pt seen. Pt last seen 03/13/18.Please advise.

## 2018-03-14 NOTE — Telephone Encounter (Signed)
Agree with this. Thanks.  Ok to continue daily neosporin and non stick pad and wrap for now, then follow dr Robson's orders.

## 2018-03-14 NOTE — Telephone Encounter (Signed)
Stacy/[facility] called requesting verbal order for PT for 2 week 2 and 1 week 2. Best cb [phone]

## 2018-03-15 NOTE — Telephone Encounter (Signed)
Agree with this. Thank you.  

## 2018-03-15 NOTE — Telephone Encounter (Signed)
Left message on vm for [place], of [facility], informing her Dr. Darnell Level is giving verbal orders for PT.

## 2018-03-16 DIAGNOSIS — M5442 Lumbago with sciatica, left side: Secondary | ICD-10-CM | POA: Diagnosis not present

## 2018-03-16 DIAGNOSIS — E1142 Type 2 diabetes mellitus with diabetic polyneuropathy: Secondary | ICD-10-CM | POA: Diagnosis not present

## 2018-03-16 DIAGNOSIS — I2510 Atherosclerotic heart disease of native coronary artery without angina pectoris: Secondary | ICD-10-CM | POA: Diagnosis not present

## 2018-03-16 DIAGNOSIS — M47816 Spondylosis without myelopathy or radiculopathy, lumbar region: Secondary | ICD-10-CM | POA: Diagnosis not present

## 2018-03-16 DIAGNOSIS — M5441 Lumbago with sciatica, right side: Secondary | ICD-10-CM | POA: Diagnosis not present

## 2018-03-16 DIAGNOSIS — M4807 Spinal stenosis, lumbosacral region: Secondary | ICD-10-CM | POA: Diagnosis not present

## 2018-03-18 DIAGNOSIS — Z9181 History of falling: Secondary | ICD-10-CM | POA: Diagnosis not present

## 2018-03-18 DIAGNOSIS — M4186 Other forms of scoliosis, lumbar region: Secondary | ICD-10-CM | POA: Diagnosis not present

## 2018-03-18 DIAGNOSIS — M461 Sacroiliitis, not elsewhere classified: Secondary | ICD-10-CM | POA: Diagnosis not present

## 2018-03-18 DIAGNOSIS — E11622 Type 2 diabetes mellitus with other skin ulcer: Secondary | ICD-10-CM | POA: Diagnosis not present

## 2018-03-18 DIAGNOSIS — M47816 Spondylosis without myelopathy or radiculopathy, lumbar region: Secondary | ICD-10-CM | POA: Diagnosis not present

## 2018-03-18 DIAGNOSIS — Z993 Dependence on wheelchair: Secondary | ICD-10-CM | POA: Diagnosis not present

## 2018-03-18 DIAGNOSIS — M5441 Lumbago with sciatica, right side: Secondary | ICD-10-CM | POA: Diagnosis not present

## 2018-03-18 DIAGNOSIS — I872 Venous insufficiency (chronic) (peripheral): Secondary | ICD-10-CM | POA: Diagnosis not present

## 2018-03-18 DIAGNOSIS — M5442 Lumbago with sciatica, left side: Secondary | ICD-10-CM | POA: Diagnosis not present

## 2018-03-18 DIAGNOSIS — I2510 Atherosclerotic heart disease of native coronary artery without angina pectoris: Secondary | ICD-10-CM | POA: Diagnosis not present

## 2018-03-18 DIAGNOSIS — I480 Paroxysmal atrial fibrillation: Secondary | ICD-10-CM | POA: Diagnosis not present

## 2018-03-18 DIAGNOSIS — M4807 Spinal stenosis, lumbosacral region: Secondary | ICD-10-CM | POA: Diagnosis not present

## 2018-03-18 DIAGNOSIS — E1151 Type 2 diabetes mellitus with diabetic peripheral angiopathy without gangrene: Secondary | ICD-10-CM | POA: Diagnosis not present

## 2018-03-18 DIAGNOSIS — Z7982 Long term (current) use of aspirin: Secondary | ICD-10-CM | POA: Diagnosis not present

## 2018-03-18 DIAGNOSIS — I110 Hypertensive heart disease with heart failure: Secondary | ICD-10-CM | POA: Diagnosis not present

## 2018-03-18 DIAGNOSIS — I5030 Unspecified diastolic (congestive) heart failure: Secondary | ICD-10-CM | POA: Diagnosis not present

## 2018-03-18 DIAGNOSIS — L97211 Non-pressure chronic ulcer of right calf limited to breakdown of skin: Secondary | ICD-10-CM | POA: Diagnosis not present

## 2018-03-18 DIAGNOSIS — Z87891 Personal history of nicotine dependence: Secondary | ICD-10-CM | POA: Diagnosis not present

## 2018-03-18 DIAGNOSIS — E1142 Type 2 diabetes mellitus with diabetic polyneuropathy: Secondary | ICD-10-CM | POA: Diagnosis not present

## 2018-03-18 DIAGNOSIS — M47814 Spondylosis without myelopathy or radiculopathy, thoracic region: Secondary | ICD-10-CM | POA: Diagnosis not present

## 2018-03-18 DIAGNOSIS — L97812 Non-pressure chronic ulcer of other part of right lower leg with fat layer exposed: Secondary | ICD-10-CM | POA: Diagnosis not present

## 2018-03-18 DIAGNOSIS — F411 Generalized anxiety disorder: Secondary | ICD-10-CM | POA: Diagnosis not present

## 2018-03-19 DIAGNOSIS — I2510 Atherosclerotic heart disease of native coronary artery without angina pectoris: Secondary | ICD-10-CM | POA: Diagnosis not present

## 2018-03-19 DIAGNOSIS — L97211 Non-pressure chronic ulcer of right calf limited to breakdown of skin: Secondary | ICD-10-CM | POA: Diagnosis not present

## 2018-03-19 DIAGNOSIS — E11622 Type 2 diabetes mellitus with other skin ulcer: Secondary | ICD-10-CM | POA: Diagnosis not present

## 2018-03-19 DIAGNOSIS — E1151 Type 2 diabetes mellitus with diabetic peripheral angiopathy without gangrene: Secondary | ICD-10-CM | POA: Diagnosis not present

## 2018-03-19 DIAGNOSIS — I872 Venous insufficiency (chronic) (peripheral): Secondary | ICD-10-CM | POA: Diagnosis not present

## 2018-03-19 DIAGNOSIS — L97812 Non-pressure chronic ulcer of other part of right lower leg with fat layer exposed: Secondary | ICD-10-CM | POA: Diagnosis not present

## 2018-03-20 DIAGNOSIS — H35423 Microcystoid degeneration of retina, bilateral: Secondary | ICD-10-CM | POA: Diagnosis not present

## 2018-03-20 DIAGNOSIS — I872 Venous insufficiency (chronic) (peripheral): Secondary | ICD-10-CM | POA: Diagnosis not present

## 2018-03-20 DIAGNOSIS — H35432 Paving stone degeneration of retina, left eye: Secondary | ICD-10-CM | POA: Diagnosis not present

## 2018-03-20 DIAGNOSIS — H43813 Vitreous degeneration, bilateral: Secondary | ICD-10-CM | POA: Diagnosis not present

## 2018-03-20 DIAGNOSIS — L97812 Non-pressure chronic ulcer of other part of right lower leg with fat layer exposed: Secondary | ICD-10-CM | POA: Diagnosis not present

## 2018-03-20 DIAGNOSIS — E11622 Type 2 diabetes mellitus with other skin ulcer: Secondary | ICD-10-CM | POA: Diagnosis not present

## 2018-03-20 DIAGNOSIS — I2510 Atherosclerotic heart disease of native coronary artery without angina pectoris: Secondary | ICD-10-CM | POA: Diagnosis not present

## 2018-03-20 DIAGNOSIS — L97211 Non-pressure chronic ulcer of right calf limited to breakdown of skin: Secondary | ICD-10-CM | POA: Diagnosis not present

## 2018-03-20 DIAGNOSIS — H353132 Nonexudative age-related macular degeneration, bilateral, intermediate dry stage: Secondary | ICD-10-CM | POA: Diagnosis not present

## 2018-03-20 DIAGNOSIS — E1151 Type 2 diabetes mellitus with diabetic peripheral angiopathy without gangrene: Secondary | ICD-10-CM | POA: Diagnosis not present

## 2018-03-21 ENCOUNTER — Telehealth: Payer: Self-pay | Admitting: *Deleted

## 2018-03-21 DIAGNOSIS — L97211 Non-pressure chronic ulcer of right calf limited to breakdown of skin: Secondary | ICD-10-CM | POA: Diagnosis not present

## 2018-03-21 DIAGNOSIS — I872 Venous insufficiency (chronic) (peripheral): Secondary | ICD-10-CM | POA: Diagnosis not present

## 2018-03-21 DIAGNOSIS — I2510 Atherosclerotic heart disease of native coronary artery without angina pectoris: Secondary | ICD-10-CM | POA: Diagnosis not present

## 2018-03-21 DIAGNOSIS — L97812 Non-pressure chronic ulcer of other part of right lower leg with fat layer exposed: Secondary | ICD-10-CM | POA: Diagnosis not present

## 2018-03-21 DIAGNOSIS — E1151 Type 2 diabetes mellitus with diabetic peripheral angiopathy without gangrene: Secondary | ICD-10-CM | POA: Diagnosis not present

## 2018-03-21 DIAGNOSIS — E11622 Type 2 diabetes mellitus with other skin ulcer: Secondary | ICD-10-CM | POA: Diagnosis not present

## 2018-03-21 MED ORDER — CEPHALEXIN 500 MG PO CAPS: 500.0000 mg | ORAL_CAPSULE | Freq: Three times a day (TID) | ORAL | 0 refills | Status: DC

## 2018-03-21 NOTE — Telephone Encounter (Signed)
Noted  

## 2018-03-21 NOTE — Telephone Encounter (Signed)
Amy contacted office back and advised per Dr Darnell Level

## 2018-03-21 NOTE — Telephone Encounter (Signed)
Please have her start keflex 500mg  three times daily sent to pharmacy. Let us know if worsening.   Lab Results  Component Value Date   CREATININE 0.90 01/24/2018

## 2018-03-21 NOTE — Telephone Encounter (Signed)
Amy with [facility] states that she saw pt for a nurse visit on 12/17 and examined the wound on her R leg, that pt has previously seen Dr Darnell Level for. She states that the site was 3+ edema and red, but not warm to the touch. She outlined the site, and advised pt to contact her back if swelling and redness exceeded the outline. Pt contacted her today and stated the site was larger. Amy is wanting to know if Dr Darnell Level would like to prescribe an abx or how he would like to proceed until the pt can be seen at [facility] wound care on Friday. pls advise

## 2018-03-22 DIAGNOSIS — I2510 Atherosclerotic heart disease of native coronary artery without angina pectoris: Secondary | ICD-10-CM | POA: Diagnosis not present

## 2018-03-22 DIAGNOSIS — L97211 Non-pressure chronic ulcer of right calf limited to breakdown of skin: Secondary | ICD-10-CM | POA: Diagnosis not present

## 2018-03-22 DIAGNOSIS — E11622 Type 2 diabetes mellitus with other skin ulcer: Secondary | ICD-10-CM | POA: Diagnosis not present

## 2018-03-22 DIAGNOSIS — E1151 Type 2 diabetes mellitus with diabetic peripheral angiopathy without gangrene: Secondary | ICD-10-CM | POA: Diagnosis not present

## 2018-03-22 DIAGNOSIS — I872 Venous insufficiency (chronic) (peripheral): Secondary | ICD-10-CM | POA: Diagnosis not present

## 2018-03-22 DIAGNOSIS — L97812 Non-pressure chronic ulcer of other part of right lower leg with fat layer exposed: Secondary | ICD-10-CM | POA: Diagnosis not present

## 2018-03-23 ENCOUNTER — Emergency Department: Payer: Medicare Other

## 2018-03-23 ENCOUNTER — Other Ambulatory Visit: Payer: Self-pay

## 2018-03-23 ENCOUNTER — Encounter: Payer: Medicare Other | Attending: Physician Assistant | Admitting: Physician Assistant

## 2018-03-23 ENCOUNTER — Encounter: Payer: Self-pay | Admitting: Emergency Medicine

## 2018-03-23 ENCOUNTER — Inpatient Hospital Stay
Admission: EM | Admit: 2018-03-23 | Discharge: 2018-03-27 | DRG: 603 | Disposition: A | Discharge status: Home Health | Payer: Medicare Other | Attending: Internal Medicine | Admitting: Internal Medicine

## 2018-03-23 DIAGNOSIS — Z86718 Personal history of other venous thrombosis and embolism: Secondary | ICD-10-CM

## 2018-03-23 DIAGNOSIS — E11622 Type 2 diabetes mellitus with other skin ulcer: Secondary | ICD-10-CM | POA: Diagnosis present

## 2018-03-23 DIAGNOSIS — I878 Other specified disorders of veins: Secondary | ICD-10-CM | POA: Diagnosis present

## 2018-03-23 DIAGNOSIS — F419 Anxiety disorder, unspecified: Secondary | ICD-10-CM | POA: Diagnosis present

## 2018-03-23 DIAGNOSIS — Z7982 Long term (current) use of aspirin: Secondary | ICD-10-CM

## 2018-03-23 DIAGNOSIS — I739 Peripheral vascular disease, unspecified: Secondary | ICD-10-CM | POA: Diagnosis not present

## 2018-03-23 DIAGNOSIS — L97919 Non-pressure chronic ulcer of unspecified part of right lower leg with unspecified severity: Secondary | ICD-10-CM | POA: Diagnosis present

## 2018-03-23 DIAGNOSIS — E785 Hyperlipidemia, unspecified: Secondary | ICD-10-CM | POA: Diagnosis present

## 2018-03-23 DIAGNOSIS — I110 Hypertensive heart disease with heart failure: Secondary | ICD-10-CM | POA: Diagnosis present

## 2018-03-23 DIAGNOSIS — Z87891 Personal history of nicotine dependence: Secondary | ICD-10-CM

## 2018-03-23 DIAGNOSIS — Z951 Presence of aortocoronary bypass graft: Secondary | ICD-10-CM | POA: Diagnosis not present

## 2018-03-23 DIAGNOSIS — E1151 Type 2 diabetes mellitus with diabetic peripheral angiopathy without gangrene: Secondary | ICD-10-CM | POA: Diagnosis present

## 2018-03-23 DIAGNOSIS — Z79899 Other long term (current) drug therapy: Secondary | ICD-10-CM

## 2018-03-23 DIAGNOSIS — I872 Venous insufficiency (chronic) (peripheral): Secondary | ICD-10-CM | POA: Diagnosis present

## 2018-03-23 DIAGNOSIS — I252 Old myocardial infarction: Secondary | ICD-10-CM | POA: Diagnosis not present

## 2018-03-23 DIAGNOSIS — G8929 Other chronic pain: Secondary | ICD-10-CM | POA: Diagnosis present

## 2018-03-23 DIAGNOSIS — B9562 Methicillin resistant Staphylococcus aureus infection as the cause of diseases classified elsewhere: Secondary | ICD-10-CM | POA: Diagnosis present

## 2018-03-23 DIAGNOSIS — Z7984 Long term (current) use of oral hypoglycemic drugs: Secondary | ICD-10-CM

## 2018-03-23 DIAGNOSIS — L03115 Cellulitis of right lower limb: Principal | ICD-10-CM | POA: Diagnosis present

## 2018-03-23 DIAGNOSIS — M549 Dorsalgia, unspecified: Secondary | ICD-10-CM | POA: Diagnosis present

## 2018-03-23 DIAGNOSIS — L97211 Non-pressure chronic ulcer of right calf limited to breakdown of skin: Secondary | ICD-10-CM | POA: Diagnosis not present

## 2018-03-23 DIAGNOSIS — Z888 Allergy status to other drugs, medicaments and biological substances status: Secondary | ICD-10-CM

## 2018-03-23 DIAGNOSIS — I2510 Atherosclerotic heart disease of native coronary artery without angina pectoris: Secondary | ICD-10-CM | POA: Diagnosis present

## 2018-03-23 DIAGNOSIS — R531 Weakness: Secondary | ICD-10-CM | POA: Diagnosis present

## 2018-03-23 DIAGNOSIS — L97812 Non-pressure chronic ulcer of other part of right lower leg with fat layer exposed: Secondary | ICD-10-CM | POA: Diagnosis not present

## 2018-03-23 DIAGNOSIS — I10 Essential (primary) hypertension: Secondary | ICD-10-CM | POA: Diagnosis not present

## 2018-03-23 DIAGNOSIS — E119 Type 2 diabetes mellitus without complications: Secondary | ICD-10-CM | POA: Diagnosis not present

## 2018-03-23 DIAGNOSIS — R600 Localized edema: Secondary | ICD-10-CM | POA: Diagnosis not present

## 2018-03-23 DIAGNOSIS — M170 Bilateral primary osteoarthritis of knee: Secondary | ICD-10-CM | POA: Diagnosis not present

## 2018-03-23 DIAGNOSIS — S81801A Unspecified open wound, right lower leg, initial encounter: Secondary | ICD-10-CM | POA: Diagnosis not present

## 2018-03-23 DIAGNOSIS — I5040 Unspecified combined systolic (congestive) and diastolic (congestive) heart failure: Secondary | ICD-10-CM | POA: Diagnosis present

## 2018-03-23 DIAGNOSIS — L0390 Cellulitis, unspecified: Secondary | ICD-10-CM | POA: Diagnosis not present

## 2018-03-23 LAB — CBC
HCT: 37.1 % (ref 36.0–46.0)
Hemoglobin: 11.5 g/dL — ABNORMAL LOW (ref 12.0–15.0)
MCH: 29.4 pg (ref 26.0–34.0)
MCHC: 31 g/dL (ref 30.0–36.0)
MCV: 94.9 fL (ref 80.0–100.0)
Platelets: 318 10*3/uL (ref 150–400)
RBC: 3.91 MIL/uL (ref 3.87–5.11)
RDW: 13.9 % (ref 11.5–15.5)
WBC: 10.6 10*3/uL — AB (ref 4.0–10.5)
nRBC: 0 % (ref 0.0–0.2)

## 2018-03-23 LAB — BASIC METABOLIC PANEL
Anion gap: 8 (ref 5–15)
BUN: 21 mg/dL (ref 8–23)
CALCIUM: 8.9 mg/dL (ref 8.9–10.3)
CO2: 25 mmol/L (ref 22–32)
CREATININE: 0.82 mg/dL (ref 0.44–1.00)
Chloride: 104 mmol/L (ref 98–111)
GFR calc non Af Amer: >60 mL/min (ref >60)
Glucose, Bld: 75 mg/dL (ref 70–99)
Potassium: 4.7 mmol/L (ref 3.5–5.1)
SODIUM: 137 mmol/L (ref 135–145)

## 2018-03-23 MED ORDER — VITAMIN B-12 1000 MCG PO TABS
1000.0000 ug | ORAL_TABLET | Freq: Every day | ORAL | Status: DC
  Administered 2018-03-24 – 2018-03-27 (×4): 1000 ug via ORAL
  Filled 2018-03-23 (×4): qty 1

## 2018-03-23 MED ORDER — ASPIRIN EC 81 MG PO TBEC
81.0000 mg | DELAYED_RELEASE_TABLET | Freq: Every day | ORAL | Status: DC
  Administered 2018-03-24 – 2018-03-27 (×4): 81 mg via ORAL
  Filled 2018-03-23 (×4): qty 1

## 2018-03-23 MED ORDER — METRONIDAZOLE IN NACL 5-0.79 MG/ML-% IV SOLN
500.0000 mg | Freq: Three times a day (TID) | INTRAVENOUS | Status: DC
  Administered 2018-03-23 – 2018-03-26 (×9): 500 mg via INTRAVENOUS
  Filled 2018-03-23 (×11): qty 100

## 2018-03-23 MED ORDER — SODIUM CHLORIDE 0.9 % IV SOLN
2.0000 g | Freq: Two times a day (BID) | INTRAVENOUS | Status: DC
  Administered 2018-03-24 – 2018-03-26 (×5): 2 g via INTRAVENOUS
  Filled 2018-03-23 (×6): qty 2

## 2018-03-23 MED ORDER — VITAMIN E 180 MG (400 UNIT) PO CAPS
400.0000 [IU] | ORAL_CAPSULE | Freq: Every day | ORAL | Status: DC
  Administered 2018-03-23 – 2018-03-27 (×5): 400 [IU] via ORAL
  Filled 2018-03-23 (×5): qty 1

## 2018-03-23 MED ORDER — ENOXAPARIN SODIUM 40 MG/0.4ML ~~LOC~~ SOLN
40.0000 mg | SUBCUTANEOUS | Status: DC
  Administered 2018-03-23 – 2018-03-26 (×4): 40 mg via SUBCUTANEOUS
  Filled 2018-03-23 (×5): qty 0.4

## 2018-03-23 MED ORDER — ACETAMINOPHEN 650 MG RE SUPP: 650.0000 mg | Freq: Four times a day (QID) | RECTAL | Status: DC | PRN

## 2018-03-23 MED ORDER — ADULT MULTIVITAMIN W/MINERALS CH
1.0000 | ORAL_TABLET | Freq: Every day | ORAL | Status: DC
  Filled 2018-03-23 (×3): qty 1

## 2018-03-23 MED ORDER — VANCOMYCIN HCL IN DEXTROSE 1-5 GM/200ML-% IV SOLN
1000.0000 mg | Freq: Once | INTRAVENOUS | Status: AC
  Administered 2018-03-23: 1000 mg via INTRAVENOUS
  Filled 2018-03-23: qty 200

## 2018-03-23 MED ORDER — ONDANSETRON HCL 4 MG/2ML IJ SOLN: 4.0000 mg | Freq: Four times a day (QID) | INTRAMUSCULAR | Status: DC | PRN

## 2018-03-23 MED ORDER — RAMIPRIL 5 MG PO CAPS
5.0000 mg | ORAL_CAPSULE | Freq: Every day | ORAL | Status: DC
  Administered 2018-03-24 – 2018-03-27 (×4): 5 mg via ORAL
  Filled 2018-03-23 (×4): qty 1

## 2018-03-23 MED ORDER — FAMOTIDINE 20 MG PO TABS
20.0000 mg | ORAL_TABLET | Freq: Every day | ORAL | Status: DC
  Administered 2018-03-23 – 2018-03-26 (×4): 20 mg via ORAL
  Filled 2018-03-23 (×4): qty 1

## 2018-03-23 MED ORDER — MORPHINE SULFATE (PF) 4 MG/ML IV SOLN
4.0000 mg | Freq: Once | INTRAVENOUS | Status: AC
  Administered 2018-03-23: 4 mg via INTRAVENOUS
  Filled 2018-03-23: qty 1

## 2018-03-23 MED ORDER — DOCUSATE SODIUM 100 MG PO CAPS
100.0000 mg | ORAL_CAPSULE | Freq: Two times a day (BID) | ORAL | Status: DC
  Administered 2018-03-23 – 2018-03-27 (×8): 100 mg via ORAL
  Filled 2018-03-23 (×8): qty 1

## 2018-03-23 MED ORDER — HYDROCODONE-ACETAMINOPHEN 5-325 MG PO TABS
1.0000 | ORAL_TABLET | Freq: Three times a day (TID) | ORAL | Status: DC | PRN
  Administered 2018-03-23 – 2018-03-27 (×7): 1 via ORAL
  Filled 2018-03-23 (×7): qty 1

## 2018-03-23 MED ORDER — SODIUM CHLORIDE 0.9 % IV SOLN
2.0000 g | Freq: Once | INTRAVENOUS | Status: AC
  Administered 2018-03-23: 2 g via INTRAVENOUS
  Filled 2018-03-23: qty 2

## 2018-03-23 MED ORDER — ALPRAZOLAM 0.5 MG PO TABS
0.5000 mg | ORAL_TABLET | Freq: Four times a day (QID) | ORAL | Status: DC | PRN
  Administered 2018-03-23 – 2018-03-26 (×8): 0.5 mg via ORAL
  Filled 2018-03-23 (×8): qty 1

## 2018-03-23 MED ORDER — VANCOMYCIN HCL 500 MG IV SOLR
500.0000 mg | INTRAVENOUS | Status: DC
  Administered 2018-03-24 – 2018-03-25 (×3): 500 mg via INTRAVENOUS
  Filled 2018-03-23 (×5): qty 500

## 2018-03-23 MED ORDER — ACETAMINOPHEN 325 MG PO TABS
650.0000 mg | ORAL_TABLET | Freq: Four times a day (QID) | ORAL | Status: DC | PRN
  Administered 2018-03-24 – 2018-03-25 (×2): 650 mg via ORAL
  Filled 2018-03-23 (×3): qty 2

## 2018-03-23 MED ORDER — PRAVASTATIN SODIUM 20 MG PO TABS
20.0000 mg | ORAL_TABLET | Freq: Every day | ORAL | Status: DC
  Administered 2018-03-24 – 2018-03-27 (×4): 20 mg via ORAL
  Filled 2018-03-23 (×4): qty 1

## 2018-03-23 MED ORDER — ONDANSETRON HCL 4 MG PO TABS: 4.0000 mg | ORAL_TABLET | Freq: Four times a day (QID) | ORAL | Status: DC | PRN

## 2018-03-23 MED ORDER — PANTOPRAZOLE SODIUM 40 MG PO TBEC
40.0000 mg | DELAYED_RELEASE_TABLET | Freq: Every day | ORAL | Status: DC
  Administered 2018-03-24 – 2018-03-27 (×4): 40 mg via ORAL
  Filled 2018-03-23 (×4): qty 1

## 2018-03-23 MED ORDER — METHOCARBAMOL 500 MG PO TABS: 250.0000 mg | ORAL_TABLET | Freq: Three times a day (TID) | ORAL | Status: DC | PRN

## 2018-03-23 MED ORDER — NITROGLYCERIN 0.4 MG SL SUBL: 0.4000 mg | SUBLINGUAL_TABLET | SUBLINGUAL | Status: DC | PRN

## 2018-03-23 MED ORDER — FUROSEMIDE 20 MG PO TABS
10.0000 mg | ORAL_TABLET | Freq: Every day | ORAL | Status: DC
  Administered 2018-03-24 – 2018-03-27 (×4): 10 mg via ORAL
  Filled 2018-03-23 (×4): qty 1

## 2018-03-23 MED ORDER — TRAZODONE HCL 50 MG PO TABS
150.0000 mg | ORAL_TABLET | Freq: Every day | ORAL | Status: DC
  Administered 2018-03-23 – 2018-03-27 (×4): 150 mg via ORAL
  Filled 2018-03-23 (×4): qty 1

## 2018-03-23 MED ORDER — LORATADINE 10 MG PO TABS: 10.0000 mg | ORAL_TABLET | Freq: Every day | ORAL | Status: DC | PRN

## 2018-03-23 MED ORDER — RISAQUAD PO CAPS
1.0000 | ORAL_CAPSULE | Freq: Every day | ORAL | Status: DC
  Administered 2018-03-23 – 2018-03-27 (×5): 1 via ORAL
  Filled 2018-03-23 (×5): qty 1

## 2018-03-23 MED ORDER — METOPROLOL TARTRATE 25 MG PO TABS
37.5000 mg | ORAL_TABLET | Freq: Two times a day (BID) | ORAL | Status: DC
  Administered 2018-03-23 – 2018-03-27 (×8): 37.5 mg via ORAL
  Filled 2018-03-23 (×8): qty 2

## 2018-03-23 NOTE — ED Notes (Signed)
Pt gone to US

## 2018-03-23 NOTE — Progress Notes (Signed)
Pharmacy Antibiotic Note  [name] is a 84 y.o. female admitted on 03/23/2018 with sepsis.  Pharmacy has been consulted for vancomycin and cefepime dosing. Patient is also on metronidazole.  Plan: Vancomycin 500 mg IV q18hr with first dose 12/21 at 0900. Goal trough 15-20 mcg/ml. Trough to be drawn 12/23 at 1400. ke 0.035, t1/2 19.8 hr, VD 34 L  Cefepime 2 g IV q12h  Height: 4\' 9"  (144.8 cm) Weight: 140 lb (63.5 kg) IBW/kg (Calculated) : 38.6  Temp (24hrs), Avg:98 F (36.7 C), Min:98 F (36.7 C), Max:98 F (36.7 C)  Recent Labs  Lab 03/23/18 1452  WBC 10.6*  CREATININE 0.82    Estimated Creatinine Clearance: 36.4 mL/min (by C-G formula based on SCr of 0.82 mg/dL).    Allergies  Allergen Reactions  . Metformin And Related Diarrhea    Antimicrobials this admission: Vancomycin 12/20 >>  Cefepime 12/20 >>  Metronidazole 12/20 >>  Dose adjustments this admission: NA  Microbiology results:   Thank you for allowing pharmacy to be a part of this patient's care.  Tawnya Crook, PharmD Pharmacy Resident  03/23/2018 5:29 PM

## 2018-03-23 NOTE — ED Triage Notes (Signed)
Pt here with c/o wound infection to right shin and calf areas. C/o swelling and pain to lower leg laterally, states she dropped a knife on it about 3 weeks ago and it hasn't healed. Pt is diabetic. Medial injury with wound on right lower leg from dog jumping on it and scratching it with toenail, wound not healing from this as well. Small wound to left lower shin noticed today also.

## 2018-03-23 NOTE — ED Notes (Signed)
Pt states she was sent from the wound center for further treatment of two wounds to the RLE, states about 6 weeks ago she dropped a knife and caused the wound to the lateral RLE and 3 weeks ago her dog scratched her medial RLE. Pt has redness, edema with like a large blistered form of the RLE. Pt has a hx of DM with wound infection in the past.

## 2018-03-23 NOTE — Plan of Care (Signed)
  Problem: Education: Goal: Knowledge of General Education information will improve Description Including pain rating scale, medication(s)/side effects and non-pharmacologic comfort measures Outcome: Progressing   

## 2018-03-23 NOTE — ED Provider Notes (Signed)
[facility] Emergency Department Provider Note  ____________________________________________  Time seen: Approximately 5:30 PM  I have reviewed the triage vital signs and the nursing notes.   HISTORY  Chief Complaint Wound Infection    HPI [name] is a 83 y.o. female with a history of DVT, diabetes, diverticulosis, hypertension who is sent to the ED today from wound care center for evaluation of right leg swelling and pain.  The patient reports that her right leg has been swollen with chronic nonhealing wounds over the past 6 weeks after dropping a knife onto her right lateral leg 6 weeks ago and her dog biting her right medial leg about 3 weeks ago.  Those wounds have not healed despite wound care, and now the leg has  become more swollen red and painful over the past week.  She denies fever chest pain or shortness of breath.  The skin was marked 2 days ago and she was started on Keflex, and today the area of erythema has expanded significantly beyond the skin marking  Pain is constant, waxing and waning, worse with weightbearing and leg movement.  No alleviating factors.  Nonradiating.  Moderate intensity.    Past Medical History:  Diagnosis Date  . Acute deep vein thrombosis (DVT) of distal vein of right lower extremity ([place]) 12/2013   Acute thrombus of R proximal gastrocnemius vein. Symptomatic provoked distal DVT  . Anxiety   . Cellulitis of leg, left 01/16/2014  . Chronic back pain 03/2012   lumbar DDD with herniation - s/p L S1 transforaminal ESI (10/2012 Dr. Sharlet Salina)  . Chronic venous insufficiency   . Coronary artery disease    CABG 1998  . Cystocele   . Depression   . Diabetes mellitus    type 2, followed by endo.  . Diastolic dysfunction   . Diverticulosis   . Hemorrhoids   . History of heart attack   . Hx of echocardiogram    a. Echo 10/13: mild LVH, EF 55-60%, Gr 1 diast dysfn, PASP 32  . Hyperlipidemia   . Hypertension   .  Irritable bowel syndrome   . Microcytic anemia   . PAD (peripheral artery disease) ([place]) 2013   ABI: R 0.91, L 0.83  . PUD (peptic ulcer disease)   . Varicose veins   . Venous ulcer of leg ([place]) 12/12/2013   Required prolonged treatment by wound center  . Viral gastroenteritis due to [place] virus 05/13/2015   hospitalization     Patient Active Problem List   Diagnosis Date Noted  . Advanced directives, counseling/discussion 01/29/2018  . Bilateral hearing loss 01/29/2018  . Spinal stenosis of lumbosacral region 01/08/2018  . Acute right-sided low back pain 12/26/2017  . Leg wound, right, initial encounter 10/30/2017  . Systolic murmur 93/81/0175  . De Quervain's tenosynovitis, left 01/09/2017  . Encounter for chronic pain management 02/09/2016  . Paroxysmal atrial fibrillation (HCC)   . Varicose veins of left lower extremity with both ulcer of calf and inflammation ([place]) 05/22/2015  . Varicose veins of right lower extremity with both ulcer of calf and inflammation ([place]) 01/23/2015  . Diabetes mellitus type 2 with peripheral artery disease ([place])   . Chronic venous insufficiency   . Bleeding from varicose veins of lower extremity, left 02/13/2014  . Diabetic polyneuropathy ([name]) 11/19/2013  . Type 2 diabetes mellitus with neurological manifestations, controlled ([place]) 11/19/2013  . Right shoulder pain 08/19/2013  . Cerumen impaction 04/16/2013  . Abdominal pain 05/14/2012  .  Peptic ulcer disease   . Unsteady gait 12/13/2011  . Chronic lower back pain 09/14/2011  . Microcytic anemia 09/06/2010  . Hyperlipidemia 09/06/2010  . GAD (generalized anxiety disorder) 03/15/2010  . Obesity, Class I, BMI 30-34.9 03/15/2010  . Hypertension 03/15/2010  . (HFpEF) heart failure with preserved ejection fraction ([place]) 03/15/2010  . Coronary artery disease      Past Surgical History:  Procedure Laterality Date  . ABDOMINAL HYSTERECTOMY  1975   TAH,BSO  . ABI  2013   R 0.91, L 0.83  .  APPENDECTOMY     per pt report  . CARDIOVASCULAR STRESS TEST  2015   low risk myoview (Nahser)  . CATARACT SURGERY    . CHOLECYSTECTOMY    . COLONOSCOPY  02/2010   extensive diverticulosis throughout, small internal hemorrhoids, rec rpt 5 yrs (Dr. Allyn Kenner)  . CORONARY ARTERY BYPASS GRAFT  1998  . dexa scan  09/2011   femur -0.8, forearm 0.6 => normal  . ENDOVENOUS ABLATION SAPHENOUS VEIN W/ LASER Left 03/2014   Kellie Simmering  . ESI  2014   L S1 transforaminal ESI x2 ([place]) without improvement  . HAND SURGERY    . lexiscan myoview  03/2011   Small basal inferolateral and anterolateral reversible perfusion defect suggests ischemia.  EF was normal.   old infarct, no new ischemia  . toe surgery    . TYMPANOPLASTY  1960s   R side  . Vault prolapse A & P repair  2009   [facility]     Prior to Admission medications   Medication Sig Start Date End Date Taking? Authorizing Provider  acetaminophen (TYLENOL) 500 MG tablet Take 500 mg by mouth every 6 (six) hours as needed for mild pain.    [provider]  ALPRAZolam Duanne Moron) 1 MG tablet Take 0.5 mg by mouth 4 (four) times daily as needed for anxiety.     [provider]  aspirin EC 81 MG tablet Take 81 mg by mouth daily.    [provider]  cephALEXin (KEFLEX) 500 MG capsule Take 1 capsule (500 mg total) by mouth 3 (three) times daily. 03/21/18   Ria Bush, MD  DEXILANT 30 MG capsule TAKE 1 CAPSULE (30 MG TOTAL) BY MOUTH DAILY. 04/25/17   Ria Bush, MD  diclofenac sodium (VOLTAREN) 1 % GEL Apply 1 application topically 3 (three) times daily. 01/09/17   Ria Bush, MD  famotidine (PEPCID) 20 MG tablet Take 1 tablet (20 mg total) by mouth at bedtime. 01/29/18   Ria Bush, MD  furosemide (LASIX) 20 MG tablet TAKE 0.5 TABLETS (10 MG TOTAL) BY MOUTH DAILY. 07/24/17   Ria Bush, MD  glucosamine-chondroitin 500-400 MG tablet Take 1 tablet by mouth daily.     [provider]  glucose blood (ONE TOUCH ULTRA TEST) test strip USE AS INSTRUCTED TO CHECK BLOOD SUGAR ONCE A DAY Dx code: E11.9 05/02/16   Elayne Snare, MD  HYDROcodone-acetaminophen (NORCO/VICODIN) 5-325 MG tablet Take 1 tablet by mouth 3 (three) times daily as needed for moderate pain. 03/07/18   Ria Bush, MD  loperamide (IMODIUM) 2 MG capsule Take 2 mg by mouth 4 (four) times daily as needed for diarrhea or loose stools.    [provider]  loratadine (CLARITIN) 10 MG tablet Take 10 mg by mouth daily as needed for allergies.    [provider]  methocarbamol (ROBAXIN) 500 MG tablet Take 0.5-1 tablets (250-500 mg total) by mouth 3 (three) times daily  as needed for muscle spasms. 01/04/18   Ria Bush, MD  metoprolol tartrate (LOPRESSOR) 25 MG tablet TAKE ONE AND HALF TABLETS (37.5 MG TOTAL) BY MOUTH 2 (TWO) TIMES DAILY. 02/14/18   Ria Bush, MD  Multiple Vitamin (MULTIVITAMIN) tablet Take 1 tablet by mouth daily. Patient reports takes a new vitamin to replace her centrum as recommended by her eye doctor.    [provider]  nitroGLYCERIN (NITROSTAT) 0.4 MG SL tablet Place 1 tablet (0.4 mg total) under the tongue every 5 (five) minutes as needed for chest pain. 02/02/17   Nahser, Wonda Cheng, MD  nystatin-triamcinolone ointment (MYCOLOG) APPLY 1 APPLICATION TOPICALLY 2 (TWO) TIMES DAILY. AS NEEDED FOR ITCHING 01/09/18   Fontaine, Belinda Block, MD  ONETOUCH VERIO test strip USE TO TEST BLOOD SUGAR ONCE DAILY 01/16/18   Elayne Snare, MD  [facility] VERIO test strip USE TO TEST BLOOD SUGAR ONCE DAILY 01/19/18   Elayne Snare, MD  pioglitazone (ACTOS) 30 MG tablet TAKE 1 TABLET BY MOUTH EVERY DAY 01/30/18   Shamleffer, Melanie Crazier, MD  pravastatin (PRAVACHOL) 20 MG tablet TAKE 1 TABLET BY MOUTH EVERY DAY 02/16/18   Ria Bush, MD  predniSONE (DELTASONE) 20 MG tablet Take two tablets daily for 3 days followed by one tablet daily for 4 days 12/25/17   Ria Bush, MD  Probiotic Product (ALIGN) 4 MG CAPS Take 1 capsule (4 mg total) by mouth daily. 08/15/17   Ria Bush, MD  ramipril (ALTACE) 5 MG capsule TAKE ONE CAPSULE BY MOUTH EVERYDAY FOR BLOOD PRESSURE 09/01/17   Ria Bush, MD  ramipril (ALTACE) 5 MG capsule TAKE ONE CAPSULE BY MOUTH EVERYDAY FOR BLOOD PRESSURE 02/27/18   Ria Bush, MD  sitaGLIPtin (JANUVIA) 100 MG tablet Take 1 tablet (100 mg total) by mouth daily. 03/08/18   Elayne Snare, MD  traZODone (DESYREL) 150 MG tablet Take 150 mg by mouth at bedtime.     [provider]  triamcinolone cream (KENALOG) 0.1 % APPLY 1 APPLICATION TOPICALLY 2 (TWO) TIMES DAILY. 04/06/15   [provider]  vitamin B-12 (CYANOCOBALAMIN) 1000 MCG tablet Take 1,000 mcg by mouth daily.    [provider]  vitamin E 400 UNIT capsule Take 400 Units by mouth daily.    [provider]     Allergies Metformin and related   Family History  Problem Relation Age of Onset  . Hypertension Mother   . Stroke Mother   . Diabetes Father   . Diabetes Sister   . Diabetes Brother   . Heart disease Son   . Colon cancer Neg Hx     Social History Social History   Tobacco Use  . Smoking status: Former Smoker    Last attempt to quit: 03/05/1979    Years since quitting: 39.0  . Smokeless tobacco: Never Used  Substance Use Topics  . Alcohol use: No    Alcohol/week: 0.0 standard drinks  . Drug use: No    Review of Systems  Constitutional:   No fever or chills.  ENT:   No sore throat. No rhinorrhea. Cardiovascular:   No chest pain or syncope. Respiratory:   No dyspnea or cough. Gastrointestinal:   Negative for abdominal pain, vomiting and diarrhea.  Musculoskeletal:   Right leg pain redness swelling as above. All other systems reviewed and are negative except as documented above in ROS and HPI.  ____________________________________________   PHYSICAL EXAM:  VITAL SIGNS: ED Triage Vitals [03/23/18  1441]  Enc Vitals Group  BP 133/61     Pulse Rate 69     Resp 18     Temp 98 F (36.7 C)     Temp Source Oral     SpO2 95 %     Weight 140 lb (63.5 kg)     Height 4\' 9"  (1.448 m)     Head Circumference      Peak Flow      Pain Score      Pain Loc      Pain Edu?      Excl. in [place]?     Vital signs reviewed, nursing assessments reviewed.   Constitutional:   Alert and oriented. Non-toxic appearance. Eyes:   Conjunctivae are normal. EOMI. PERRL. ENT      Head:   Normocephalic and atraumatic.      Nose:   No congestion/rhinnorhea.       Mouth/Throat:   MMM, no pharyngeal erythema. No peritonsillar mass.       Neck:   No meningismus. Full ROM. Hematological/Lymphatic/Immunilogical:   No cervical lymphadenopathy. Cardiovascular:   RRR. Symmetric bilateral radial and DP pulses.  No murmurs. Cap refill less than 2 seconds. Respiratory:   Normal respiratory effort without tachypnea/retractions. Breath sounds are clear and equal bilaterally. No wheezes/rales/rhonchi. Gastrointestinal:   Soft and nontender. Non distended. There is no CVA tenderness.  No rebound, rigidity, or guarding.  Musculoskeletal:   Normal range of motion in all extremities. No joint effusions.  Right lower leg is edematous with a large area of erythema over the lower leg, extending about 3 cm proximal to an area of skin marking from 2 days ago.  The whole area is tender, and it is significantly enlarged calf circumference compared to the left.  No crepitus.  There is a 3 cm soft tissue wound on the right lower leg laterally which has granulation tissue and exposed subcutaneous fat.  No purulent drainage or undermining.  There is a 2 cm soft tissue wound on the right lower leg medially which has granulation tissue and no purulent drainage.. Neurologic:   Normal speech and language.  Motor grossly intact. No acute focal neurologic deficits are appreciated.  Skin:    Skin is warm, dry with findings as  above. ____________________________________________    LABS (pertinent positives/negatives) (all labs ordered are listed, but only abnormal results are displayed) Labs Reviewed  CBC - Abnormal; Notable for the following components:      Result Value   WBC 10.6 (*)    Hemoglobin 11.5 (*)    All other components within normal limits  BASIC METABOLIC PANEL   ____________________________________________   EKG    ____________________________________________    RADIOLOGY  Dg Tibia/fibula Right  Result Date: 03/23/2018 CLINICAL DATA:  Right lower extremity edema and pain EXAM: RIGHT TIBIA AND FIBULA - 2 VIEW COMPARISON:  None. FINDINGS: No fracture or malalignment. Small plantar calcaneal spur and posterior enthesophyte. No periostitis or bone destruction. Indented appearance of the soft tissues at the proximal and distal lateral leg and the mid medial leg, presumably corresponding to the history of wounds. No soft tissue emphysema. Clips in the soft tissues and vascular calcification IMPRESSION: 1. No acute osseous abnormality 2. Soft tissue indentation within the proximal mid and lower leg presumably corresponding to the history of wounds Electronically Signed   By: Donavan Foil M.D.   On: 03/23/2018 16:32   US Venous Img Lower Unilateral Right  Result Date: 03/23/2018 CLINICAL DATA:  Right lower extremity pain  and edema. Evaluate for DVT. EXAM: RIGHT LOWER EXTREMITY VENOUS DOPPLER ULTRASOUND TECHNIQUE: Gray-scale sonography with graded compression, as well as color Doppler and duplex ultrasound were performed to evaluate the lower extremity deep venous systems from the level of the common femoral vein and including the common femoral, femoral, profunda femoral, popliteal and calf veins including the posterior tibial, peroneal and gastrocnemius veins when visible. The superficial great saphenous vein was also interrogated. Spectral Doppler was utilized to evaluate flow at rest and with  distal augmentation maneuvers in the common femoral, femoral and popliteal veins. COMPARISON:  None. FINDINGS: Contralateral Common Femoral Vein: Respiratory phasicity is normal and symmetric with the symptomatic side. No evidence of thrombus. Normal compressibility. Common Femoral Vein: No evidence of thrombus. Normal compressibility, respiratory phasicity and response to augmentation. Saphenofemoral Junction: No evidence of thrombus. Normal compressibility and flow on color Doppler imaging. Profunda Femoral Vein: No evidence of thrombus. Normal compressibility and flow on color Doppler imaging. Femoral Vein: No evidence of thrombus. Normal compressibility, respiratory phasicity and response to augmentation. Popliteal Vein: No evidence of thrombus. Normal compressibility, respiratory phasicity and response to augmentation. Calf Veins: No evidence of thrombus. Normal compressibility and flow on color Doppler imaging. Superficial Great Saphenous Vein: No evidence of thrombus. Normal compressibility. Venous Reflux:  None. Other Findings: Minimal amount of subcutaneous edema is noted at the level of the right calf. IMPRESSION: No evidence of DVT within the right lower extremity Electronically Signed   By: Sandi Mariscal M.D.   On: 03/23/2018 16:49    ____________________________________________   PROCEDURES Procedures  ____________________________________________  DIFFERENTIAL DIAGNOSIS   Cellulitis,osteomyelitis, DVT  CLINICAL IMPRESSION / ASSESSMENT AND PLAN / ED COURSE  Pertinent labs & imaging results that were available during my care of the patient were reviewed by me and considered in my medical decision making (see chart for details).      Clinical Course as of Mar 23 1729  Fri Mar 23, 2018  1556 Presents with a right lower leg cellulitis.  Also obtain an ultrasound to evaluate for DVT, x-ray to evaluate for evidence of osteomyelitis or subcutaneous emphysema.  There is no crepitus, does  not appear to be consistent with necrotizing fasciitis especially given the slowly progressive course.  However it has failed outpatient treatment already and with her chronic wounds and diabetes, she is at high risk for continued worsening of her infectious process if this is managed outpatient.  There is also a strange fluctuance to her skin on exam which I do not think is due to abscess, and there is no bulla formation, not consistent with SJS.  I think most likely this is related to her very thin friable skin and subcutaneous edema..   [PS]  0100 X-ray of the right lower leg negative for acute pathology.  No evidence of subcutaneous emphysema.  Images viewed by me, radiology report viewed by me.  Ultrasound of right leg also negative for DVT.   [PS]    Clinical Course User Index [PS] Carrie Mew, MD     ____________________________________________   FINAL CLINICAL IMPRESSION(S) / ED DIAGNOSES    Final diagnoses:  Cellulitis of right lower extremity  Type 2 diabetes mellitus with other skin ulcer, without long-term current use of insulin [facility])     ED Discharge Orders    None      Portions of this note were generated with dragon dictation software. Dictation errors may occur despite best attempts at proofreading.   Carrie Mew, MD 03/23/18 [id number]

## 2018-03-23 NOTE — H&P (Signed)
[name] is an 84 y.o. female.   Chief Complaint: Wound HPI: The patient with past medical history significant for hypertension, CAD status post CABG, peripheral artery disease, diabetes, CHF with preserved systolic function and mild diastolic dysfunction presents to the emergency department from wound clinic due to expanding tenderness and erythema of her right lower extremity.  The patient had been on Keflex but developed swelling of her right lower extremity above the wounds to her medial and lateral ankle.  She denies fevers, nausea or vomiting.  Ultrasound of her leg was obtained in the emergency department that did not demonstrate abscess or gas infiltration.  Her wounds appear to be nonhealing.  She was given cefepime, vancomycin and metronidazole.  Thus the emergency department staff called the hospitalist service for admission.  Past Medical History:  Diagnosis Date  . Acute deep vein thrombosis (DVT) of distal vein of right lower extremity ([place]) 12/2013   Acute thrombus of R proximal gastrocnemius vein. Symptomatic provoked distal DVT  . Anxiety   . Cellulitis of leg, left 01/16/2014  . Chronic back pain 03/2012   lumbar DDD with herniation - s/p L S1 transforaminal ESI (10/2012 Dr. Sharlet Salina)  . Chronic venous insufficiency   . Coronary artery disease    CABG 1998  . Cystocele   . Depression   . Diabetes mellitus    type 2, followed by endo.  . Diastolic dysfunction   . Diverticulosis   . Hemorrhoids   . History of heart attack   . Hx of echocardiogram    a. Echo 10/13: mild LVH, EF 55-60%, Gr 1 diast dysfn, PASP 32  . Hyperlipidemia   . Hypertension   . Irritable bowel syndrome   . Microcytic anemia   . PAD (peripheral artery disease) ([place]) 2013   ABI: R 0.91, L 0.83  . PUD (peptic ulcer disease)   . Varicose veins   . Venous ulcer of leg ([place]) 12/12/2013   Required prolonged treatment by wound center  . Viral gastroenteritis due to [place] virus 05/13/2015   hospitalization    Past Surgical History:  Procedure Laterality Date  . ABDOMINAL HYSTERECTOMY  1975   TAH,BSO  . ABI  2013   R 0.91, L 0.83  . APPENDECTOMY     per pt report  . CARDIOVASCULAR STRESS TEST  2015   low risk myoview ([place])  . CATARACT SURGERY    . CHOLECYSTECTOMY    . COLONOSCOPY  02/2010   extensive diverticulosis throughout, small internal hemorrhoids, rec rpt 5 yrs (Dr. Allyn Kenner)  . CORONARY ARTERY BYPASS GRAFT  1998  . dexa scan  09/2011   femur -0.8, forearm 0.6 => normal  . ENDOVENOUS ABLATION SAPHENOUS VEIN W/ LASER Left 03/2014   Kellie Simmering  . ESI  2014   L S1 transforaminal ESI x2 ([place]) without improvement  . HAND SURGERY    . lexiscan myoview  03/2011   Small basal inferolateral and anterolateral reversible perfusion defect suggests ischemia.  EF was normal.   old infarct, no new ischemia  . toe surgery    . TYMPANOPLASTY  1960s   R side  . Vault prolapse A & P repair  2009   Dr Rhodia Albright [facility] hospital    Family History  Problem Relation Age of Onset  . Hypertension Mother   . Stroke Mother   . Diabetes Father   . Diabetes Sister   . Diabetes Brother   . Heart disease Son   . Colon cancer  Neg Hx    Social History:  reports that she quit smoking about 39 years ago. She has never used smokeless tobacco. She reports that she does not drink alcohol or use drugs.  Allergies:  Allergies  Allergen Reactions  . Metformin And Related Diarrhea    Medications Prior to Admission  Medication Sig Dispense Refill  . acetaminophen (TYLENOL) 500 MG tablet Take 500 mg by mouth every 6 (six) hours as needed for mild pain.    Marland Kitchen ALPRAZolam (XANAX) 1 MG tablet Take 0.5 mg by mouth 4 (four) times daily as needed for anxiety.     Marland Kitchen aspirin EC 81 MG tablet Take 81 mg by mouth daily.    . cephALEXin (KEFLEX) 500 MG capsule Take 1 capsule (500 mg total) by mouth 3 (three) times daily. 21 capsule 0  . DEXILANT 30 MG capsule TAKE 1 CAPSULE (30 MG TOTAL) BY MOUTH  DAILY. 30 capsule 11  . diclofenac sodium (VOLTAREN) 1 % GEL Apply 1 application topically 3 (three) times daily. 1 Tube 1  . famotidine (PEPCID) 20 MG tablet Take 1 tablet (20 mg total) by mouth at bedtime.    . furosemide (LASIX) 20 MG tablet TAKE 0.5 TABLETS (10 MG TOTAL) BY MOUTH DAILY. 45 tablet 3  . glucosamine-chondroitin 500-400 MG tablet Take 1 tablet by mouth daily.     Marland Kitchen glucose blood (ONE TOUCH ULTRA TEST) test strip USE AS INSTRUCTED TO CHECK BLOOD SUGAR ONCE A DAY Dx code: E11.9 50 each 5  . HYDROcodone-acetaminophen (NORCO/VICODIN) 5-325 MG tablet Take 1 tablet by mouth 3 (three) times daily as needed for moderate pain. 90 tablet 0  . loperamide (IMODIUM) 2 MG capsule Take 2 mg by mouth 4 (four) times daily as needed for diarrhea or loose stools.    Marland Kitchen loratadine (CLARITIN) 10 MG tablet Take 10 mg by mouth daily as needed for allergies.    . methocarbamol (ROBAXIN) 500 MG tablet Take 0.5-1 tablets (250-500 mg total) by mouth 3 (three) times daily as needed for muscle spasms. 20 tablet 0  . metoprolol tartrate (LOPRESSOR) 25 MG tablet TAKE ONE AND HALF TABLETS (37.5 MG TOTAL) BY MOUTH 2 (TWO) TIMES DAILY. 270 tablet 1  . Multiple Vitamin (MULTIVITAMIN) tablet Take 1 tablet by mouth daily. Patient reports takes a new vitamin to replace her centrum as recommended by her eye doctor.    . nitroGLYCERIN (NITROSTAT) 0.4 MG SL tablet Place 1 tablet (0.4 mg total) under the tongue every 5 (five) minutes as needed for chest pain. 25 tablet 3  . nystatin-triamcinolone ointment (MYCOLOG) APPLY 1 APPLICATION TOPICALLY 2 (TWO) TIMES DAILY. AS NEEDED FOR ITCHING 60 g 1  . ONETOUCH VERIO test strip USE TO TEST BLOOD SUGAR ONCE DAILY 100 each 6  . pioglitazone (ACTOS) 30 MG tablet TAKE 1 TABLET BY MOUTH EVERY DAY 90 tablet 3  . pravastatin (PRAVACHOL) 20 MG tablet TAKE 1 TABLET BY MOUTH EVERY DAY 90 tablet 3  . Probiotic Product (ALIGN) 4 MG CAPS Take 1 capsule (4 mg total) by mouth daily.    .  ramipril (ALTACE) 5 MG capsule TAKE ONE CAPSULE BY MOUTH EVERYDAY FOR BLOOD PRESSURE 90 capsule 1  . sitaGLIPtin (JANUVIA) 100 MG tablet Take 1 tablet (100 mg total) by mouth daily. 30 tablet 2  . traZODone (DESYREL) 150 MG tablet Take 150 mg by mouth at bedtime.     . triamcinolone cream (KENALOG) 0.1 % APPLY 1 APPLICATION TOPICALLY 2 (TWO) TIMES DAILY.  1  .  vitamin B-12 (CYANOCOBALAMIN) 1000 MCG tablet Take 1,000 mcg by mouth daily.    . vitamin E 400 UNIT capsule Take 400 Units by mouth daily.    . predniSONE (DELTASONE) 20 MG tablet Take two tablets daily for 3 days followed by one tablet daily for 4 days (Patient not taking: Reported on 03/23/2018) 10 tablet 0    Results for orders placed or performed during the hospital encounter of 03/23/18 (from the past 48 hour(s))  CBC     Status: Abnormal   Collection Time: 03/23/18  2:52 PM  Result Value Ref Range   WBC 10.6 (H) 4.0 - 10.5 K/uL   RBC 3.91 3.87 - 5.11 MIL/uL   Hemoglobin 11.5 (L) 12.0 - 15.0 g/dL   HCT 37.1 36.0 - 46.0 %   MCV 94.9 80.0 - 100.0 fL   MCH 29.4 26.0 - 34.0 pg   MCHC 31.0 30.0 - 36.0 g/dL   RDW 13.9 11.5 - 15.5 %   Platelets 318 150 - 400 K/uL   nRBC 0.0 0.0 - 0.2 %    Comment: Performed at [facility], [address] [place], [place] [address]  Basic metabolic panel     Status: None   Collection Time: 03/23/18  2:52 PM  Result Value Ref Range   Sodium 137 135 - 145 mmol/L   Potassium 4.7 3.5 - 5.1 mmol/L    Comment: HEMOLYSIS AT THIS LEVEL MAY AFFECT RESULT   Chloride 104 98 - 111 mmol/L   CO2 25 22 - 32 mmol/L   Glucose, Bld 75 70 - 99 mg/dL   BUN 21 8 - 23 mg/dL   Creatinine, Ser 0.82 0.44 - 1.00 mg/dL   Calcium 8.9 8.9 - 10.3 mg/dL   GFR calc non Af Amer >60 >60 mL/min   GFR calc Af Amer >60 >60 mL/min   Anion gap 8 5 - 15    Comment: Performed at [facility], [address] [place], [place] [address]   Dg Tibia/fibula Right  Result Date: 03/23/2018 CLINICAL DATA:  Right  lower extremity edema and pain EXAM: RIGHT TIBIA AND FIBULA - 2 VIEW COMPARISON:  None. FINDINGS: No fracture or malalignment. Small plantar calcaneal spur and posterior enthesophyte. No periostitis or bone destruction. Indented appearance of the soft tissues at the proximal and distal lateral leg and the mid medial leg, presumably corresponding to the history of wounds. No soft tissue emphysema. Clips in the soft tissues and vascular calcification IMPRESSION: 1. No acute osseous abnormality 2. Soft tissue indentation within the proximal mid and lower leg presumably corresponding to the history of wounds Electronically Signed   By: Donavan Foil M.D.   On: 03/23/2018 16:32   US Venous Img Lower Unilateral Right  Result Date: 03/23/2018 CLINICAL DATA:  Right lower extremity pain and edema. Evaluate for DVT. EXAM: RIGHT LOWER EXTREMITY VENOUS DOPPLER ULTRASOUND TECHNIQUE: Gray-scale sonography with graded compression, as well as color Doppler and duplex ultrasound were performed to evaluate the lower extremity deep venous systems from the level of the common femoral vein and including the common femoral, femoral, profunda femoral, popliteal and calf veins including the posterior tibial, peroneal and gastrocnemius veins when visible. The superficial great saphenous vein was also interrogated. Spectral Doppler was utilized to evaluate flow at rest and with distal augmentation maneuvers in the common femoral, femoral and popliteal veins. COMPARISON:  None. FINDINGS: Contralateral Common Femoral Vein: Respiratory phasicity is normal and symmetric with the symptomatic side. No evidence of  thrombus. Normal compressibility. Common Femoral Vein: No evidence of thrombus. Normal compressibility, respiratory phasicity and response to augmentation. Saphenofemoral Junction: No evidence of thrombus. Normal compressibility and flow on color Doppler imaging. Profunda Femoral Vein: No evidence of thrombus. Normal compressibility  and flow on color Doppler imaging. Femoral Vein: No evidence of thrombus. Normal compressibility, respiratory phasicity and response to augmentation. Popliteal Vein: No evidence of thrombus. Normal compressibility, respiratory phasicity and response to augmentation. Calf Veins: No evidence of thrombus. Normal compressibility and flow on color Doppler imaging. Superficial Great Saphenous Vein: No evidence of thrombus. Normal compressibility. Venous Reflux:  None. Other Findings: Minimal amount of subcutaneous edema is noted at the level of the right calf. IMPRESSION: No evidence of DVT within the right lower extremity Electronically Signed   By: Sandi Mariscal M.D.   On: 03/23/2018 16:49    Review of Systems  Constitutional: Negative for chills and fever.  HENT: Negative for sore throat and tinnitus.   Eyes: Negative for blurred vision and redness.  Respiratory: Negative for cough and shortness of breath.   Cardiovascular: Positive for leg swelling. Negative for chest pain, palpitations, orthopnea and PND.  Gastrointestinal: Negative for abdominal pain, diarrhea, nausea and vomiting.  Genitourinary: Negative for dysuria, frequency and urgency.  Musculoskeletal: Negative for joint pain and myalgias.  Skin: Negative for rash.       No lesions  Neurological: Negative for speech change, focal weakness and weakness.  Endo/Heme/Allergies: Does not bruise/bleed easily.       No temperature intolerance  Psychiatric/Behavioral: Negative for depression and suicidal ideas.    Blood pressure (!) 145/65, pulse 71, temperature 97.9 F (36.6 C), temperature source Oral, resp. rate 18, height 4\' 9"  (1.448 m), weight 63.5 kg, SpO2 100 %. Physical Exam  Vitals reviewed. Constitutional: She is oriented to person, place, and time. She appears well-developed and well-nourished. No distress.  HENT:  Head: Normocephalic and atraumatic.  Mouth/Throat: Oropharynx is clear and moist.  Eyes: Pupils are equal, round,  and reactive to light. Conjunctivae and EOM are normal. No scleral icterus.  Neck: Normal range of motion. Neck supple. No JVD present. No tracheal deviation present. No thyromegaly present.  Cardiovascular: Normal rate, regular rhythm and normal heart sounds. Exam reveals no gallop and no friction rub.  No murmur heard. Respiratory: Effort normal and breath sounds normal.  GI: Soft. Bowel sounds are normal. She exhibits no distension. There is no abdominal tenderness.  Genitourinary:    Genitourinary Comments: Deferred   Musculoskeletal: Normal range of motion.        General: No edema.  Lymphadenopathy:    She has no cervical adenopathy.  Neurological: She is alert and oriented to person, place, and time. No cranial nerve deficit. She exhibits normal muscle tone.  Skin: Skin is warm and dry. No rash noted. No erythema.     Psychiatric: She has a normal mood and affect. Her behavior is normal. Judgment and thought content normal.     Assessment/Plan This is an 84 year old female admitted for cellulitis. 1.  Cellulitis: Right leg swelling and tenderness due to nonhealing medial and lateral ankle wounds.  No signs or symptoms of sepsis or abscess.  Wound encompasses more than one third of the right lower extremity and she is failed outpatient treatment.  Continue vancomycin.  Narrow coverage once MRSA ruled out. 2.  Coronary artery disease: Stable; continue aspirin.  Nitroglycerin as needed. 3.  Hypertension: Controlled; continue ramipril and metoprolol 4.  Diabetes mellitus type 2: Hold  oral hypoglycemic agents.  Sliding scale insulin while hospitalized 5.  Hyperlipidemia: Continue statin therapy 6.  DVT prophylaxis: Lovenox 7.  GI prophylaxis: Pantoprazole per home regimen The patient is a full code.  Time spent on admission orders and patient care approximately 45 minutes  Harrie Foreman, MD 03/23/2018, 10:18 PM

## 2018-03-24 NOTE — Plan of Care (Signed)

## 2018-03-24 NOTE — Progress Notes (Signed)
[place] [facility] [place] NAME: [name]    MR#:  [id number]  DATE OF BIRTH:  05/10/1927  SUBJECTIVE:  CHIEF COMPLAINT: Patient reports redness of the right leg is improving Out of bed to chair denies any other complaints  REVIEW OF SYSTEMS:  CONSTITUTIONAL: No fever, fatigue or weakness.  EYES: No blurred or double vision.  EARS, NOSE, AND THROAT: No tinnitus or ear pain.  RESPIRATORY: No cough, shortness of breath, wheezing or hemoptysis.  CARDIOVASCULAR: No chest pain, orthopnea, edema.  GASTROINTESTINAL: No nausea, vomiting, diarrhea or abdominal pain.  GENITOURINARY: No dysuria, hematuria.  ENDOCRINE: No polyuria, nocturia,  HEMATOLOGY: No anemia, easy bruising or bleeding SKIN: No rash or lesion. MUSCULOSKELETAL: No joint pain or arthritis.   NEUROLOGIC: No tingling, numbness, weakness.  PSYCHIATRY: No anxiety or depression.   DRUG ALLERGIES:   Allergies  Allergen Reactions  . Metformin And Related Diarrhea    VITALS:  Blood pressure (!) 132/52, pulse 78, temperature 98.5 F (36.9 C), temperature source Oral, resp. rate 18, height 4\' 9"  (1.448 m), weight 63.5 kg, SpO2 96 %.  PHYSICAL EXAMINATION:  GENERAL:  84 y.o.-year-old patient lying in the bed with no acute distress.  EYES: Pupils equal, round, reactive to light and accommodation. No scleral icterus. Extraocular muscles intact.  HEENT: Head atraumatic, normocephalic. Oropharynx and nasopharynx clear.  NECK:  Supple, no jugular venous distention. No thyroid enlargement, no tenderness.  LUNGS: Normal breath sounds bilaterally, no wheezing, rales,rhonchi or crepitation. No use of accessory muscles of respiration.  CARDIOVASCULAR: S1, S2 normal. No murmurs, rubs, or gallops.  ABDOMEN: Soft, nontender, nondistended. Bowel sounds present. No organomegaly or mass.  EXTREMITIES: No pedal edema, cyanosis, or clubbing.  NEUROLOGIC: Awake, alert and oriented x3 sensation  intact. Gait not checked.  PSYCHIATRIC: The patient is alert and oriented x 3.  SKIN: Right leg with medial and lateral wounds with purulent discharge erythema, minimal tenderness is present, no calf tenderness   LABORATORY PANEL:   CBC Recent Labs  Lab 03/23/18 1452  WBC 10.6*  HGB 11.5*  HCT 37.1  PLT 318   ------------------------------------------------------------------------------------------------------------------  Chemistries  Recent Labs  Lab 03/23/18 1452  NA 137  K 4.7  CL 104  CO2 25  GLUCOSE 75  BUN 21  CREATININE 0.82  CALCIUM 8.9   ------------------------------------------------------------------------------------------------------------------  Cardiac Enzymes No results for input(s): TROPONINI in the last 168 hours. ------------------------------------------------------------------------------------------------------------------  RADIOLOGY:  Dg Tibia/fibula Right  Result Date: 03/23/2018 CLINICAL DATA:  Right lower extremity edema and pain EXAM: RIGHT TIBIA AND FIBULA - 2 VIEW COMPARISON:  None. FINDINGS: No fracture or malalignment. Small plantar calcaneal spur and posterior enthesophyte. No periostitis or bone destruction. Indented appearance of the soft tissues at the proximal and distal lateral leg and the mid medial leg, presumably corresponding to the history of wounds. No soft tissue emphysema. Clips in the soft tissues and vascular calcification IMPRESSION: 1. No acute osseous abnormality 2. Soft tissue indentation within the proximal mid and lower leg presumably corresponding to the history of wounds Electronically Signed   By: Donavan Foil M.D.   On: 03/23/2018 16:32   US Venous Img Lower Unilateral Right  Result Date: 03/23/2018 CLINICAL DATA:  Right lower extremity pain and edema. Evaluate for DVT. EXAM: RIGHT LOWER EXTREMITY VENOUS DOPPLER ULTRASOUND TECHNIQUE: Gray-scale sonography with graded compression, as well as color Doppler and  duplex ultrasound were performed to evaluate the lower extremity deep venous systems from the level  of the common femoral vein and including the common femoral, femoral, profunda femoral, popliteal and calf veins including the posterior tibial, peroneal and gastrocnemius veins when visible. The superficial great saphenous vein was also interrogated. Spectral Doppler was utilized to evaluate flow at rest and with distal augmentation maneuvers in the common femoral, femoral and popliteal veins. COMPARISON:  None. FINDINGS: Contralateral Common Femoral Vein: Respiratory phasicity is normal and symmetric with the symptomatic side. No evidence of thrombus. Normal compressibility. Common Femoral Vein: No evidence of thrombus. Normal compressibility, respiratory phasicity and response to augmentation. Saphenofemoral Junction: No evidence of thrombus. Normal compressibility and flow on color Doppler imaging. Profunda Femoral Vein: No evidence of thrombus. Normal compressibility and flow on color Doppler imaging. Femoral Vein: No evidence of thrombus. Normal compressibility, respiratory phasicity and response to augmentation. Popliteal Vein: No evidence of thrombus. Normal compressibility, respiratory phasicity and response to augmentation. Calf Veins: No evidence of thrombus. Normal compressibility and flow on color Doppler imaging. Superficial Great Saphenous Vein: No evidence of thrombus. Normal compressibility. Venous Reflux:  None. Other Findings: Minimal amount of subcutaneous edema is noted at the level of the right calf. IMPRESSION: No evidence of DVT within the right lower extremity Electronically Signed   By: Sandi Mariscal M.D.   On: 03/23/2018 16:49    EKG:   Orders placed or performed in visit on 02/21/17  . EKG 12-Lead    ASSESSMENT AND PLAN:     1.  Cellulitis: Right leg swelling and tenderness due to nonhealing medial and lateral ankle wounds.   No signs or symptoms of sepsis or abscess.   DVT  ruled out with negative venous Dopplers  wound encompasses more than one third of the right lower extremity and she is failed outpatient treatment.   Continue cefepime, vancomycin.  Narrow coverage once MRSA ruled out. Wound care culture wound 2.  Coronary artery disease: Stable; continue aspirin.  Nitroglycerin as needed. 3.  Hypertension: Controlled; continue ramipril and metoprolol 4.  Diabetes mellitus type 2: Hold oral hypoglycemic agents.  Sliding scale insulin while hospitalized 5.  Hyperlipidemia: Continue statin therapy 6.  DVT prophylaxis: Lovenox   All the records are reviewed and case discussed with Care Management/Social Workerr. Management plans discussed with the patient, family and they are in agreement.  CODE STATUS: fc   TOTAL TIME TAKING CARE OF THIS PATIENT: 36 minutes.   POSSIBLE D/C IN DAYS, DEPENDING ON CLINICAL CONDITION.  Note: This dictation was prepared with Dragon dictation along with smaller phrase technology. Any transcriptional errors that result from this process are unintentional.   Nicholes Mango M.D on 03/24/2018 at 3:19 PM  Between 7am to 6pm - Pager - [phone] After 6pm go to [email or web address] - password [email or web address] [facility]  [facility]  Office  [phone]  CC: Primary care physician; Ria Bush, MD

## 2018-03-25 LAB — CREATININE, SERUM
Creatinine, Ser: 0.79 mg/dL (ref 0.44–1.00)
GFR calc Af Amer: >60 mL/min (ref >60)
GFR calc non Af Amer: >60 mL/min (ref >60)

## 2018-03-25 NOTE — Progress Notes (Signed)
Alert but forgetful. Still with redness to right lower extremity. Medicated for pain. Complains of pain when getting up to ambulate. Pt able to sleep in between care.

## 2018-03-25 NOTE — Progress Notes (Signed)
Family Meeting Note  Advance Directive:yes  Today a meeting took place with the Patient.     The following clinical team members were present during this meeting:MD  The following were discussed:Patient's diagnosis: Right leg cellulitis with wounds on the lateral malleolus, coronary artery disease, anxiety, hypertension, diabetes mellitus, hyperlipidemia, treatment plan of care discussed in detail with the patient and daughter-in-law at bedside.  They both verbalized understanding of the plan   Patient's progosis: Unable to determine and Goals for treatment: Full Code  Care power of attorney granddaughter [name], grandson [name] and close friend [name]  Additional follow-up to be provided: Hospitalist  Time spent during discussion:17 MIN  Nicholes Mango, MD

## 2018-03-25 NOTE — Progress Notes (Signed)
FAREEDAH, MAHLER ([id number]) Visit Report for 03/23/2018 Allergy List Details Patient Name: Forbis, Mckynna C. Date of Service: 03/23/2018 1:00 PM Medical Record Number: [id number] Patient Account Number: [id number] Date of Birth/Sex: 01/16/1928 (84 y.o. F) Treating RN: Secundino Ginger Primary Care Boots Mcglown: Ria Bush Other Clinician: Referring Jentzen Minasyan: Ria Bush Treating Melvina Pangelinan/Extender: STONE III, HOYT Weeks in Treatment: 0 Allergies Active Allergies No Known Allergies Allergy Notes Electronic Signature(s) Signed: 03/23/2018 4:48:15 PM By: Secundino Ginger Entered By: Secundino Ginger on 03/23/2018 13:10:14 Nikolov, [name] ([id number]) -------------------------------------------------------------------------------- Arrival Information Details Patient Name: Zara, Donna C. Date of Service: 03/23/2018 1:00 PM Medical Record Number: [id number] Patient Account Number: [id number] Date of Birth/Sex: 05-29-1927 (84 y.o. F) Treating RN: Secundino Ginger Primary Care Makenze Ellett: Ria Bush Other Clinician: Referring Allura Doepke: Ria Bush Treating Danasha Melman/Extender: Melburn Hake, HOYT Weeks in Treatment: 0 Visit Information Patient Arrived: Wheel Chair Arrival Time: 13:02 Accompanied By: daughter Transfer Assistance: Other History Since Last Visit Added or deleted any medications: No Any new allergies or adverse reactions: No Had a fall or experienced change in activities of daily living that may affect risk of falls: No Signs or symptoms of abuse/neglect since last visito No Hospitalized since last visit: No Implantable device outside of the clinic excluding cellular tissue based products placed in the center since last visit: No Has Dressing in Place as Prescribed: Yes Pain Present Now: Yes Notes pt needs assist from [facility] to chair. pt stated has 9/10 pain to wound in leg. Electronic Signature(s) Signed: 03/23/2018 4:48:15 PM By: Secundino Ginger Entered By: Secundino Ginger on 03/23/2018  13:05:34 Odaniel, [name] ([id number]) -------------------------------------------------------------------------------- Clinic Level of Care Assessment Details Patient Name: Friedmann, Sirenity C. Date of Service: 03/23/2018 1:00 PM Medical Record Number: [id number] Patient Account Number: [id number] Date of Birth/Sex: 01/28/1928 (84 y.o. F) Treating RN: Montey Hora Primary Care Joran Kallal: Ria Bush Other Clinician: Referring Katherleen Folkes: Ria Bush Treating Jaliana Medellin/Extender: STONE III, HOYT Weeks in Treatment: 0 Clinic Level of Care Assessment Items TOOL 2 Quantity Score []  - Use when only an EandM is performed on the INITIAL visit 0 ASSESSMENTS - Nursing Assessment / Reassessment X - General Physical Exam (combine w/ comprehensive assessment (listed just below) when 1 20 performed on new pt. evals) X- 1 25 Comprehensive Assessment (HX, ROS, Risk Assessments, Wounds Hx, etc.) ASSESSMENTS - Wound and Skin Assessment / Reassessment []  - Simple Wound Assessment / Reassessment - one wound 0 X- 2 5 Complex Wound Assessment / Reassessment - multiple wounds []  - 0 Dermatologic / Skin Assessment (not related to wound area) ASSESSMENTS - Ostomy and/or Continence Assessment and Care []  - Incontinence Assessment and Management 0 []  - 0 Ostomy Care Assessment and Management (repouching, etc.) PROCESS - Coordination of Care X - Simple Patient / Family Education for ongoing care 1 15 []  - 0 Complex (extensive) Patient / Family Education for ongoing care X- 1 10 Staff obtains Programmer, systems, Records, Test Results / Process Orders []  - 0 Staff telephones HHA, Nursing Homes / Clarify orders / etc []  - 0 Routine Transfer to another Facility (non-emergent condition) []  - 0 Routine Hospital Admission (non-emergent condition) X- 1 15 New Admissions / Biomedical engineer / Ordering NPWT, Apligraf, etc. []  - 0 Emergency Hospital Admission (emergent condition) X- 1 10 Simple  Discharge Coordination []  - 0 Complex (extensive) Discharge Coordination PROCESS - Special Needs []  - Pediatric / Minor Patient Management 0 []  - 0 Isolation Patient Management Lasker, Jenipher C. ([id number]) []  - 0 Hearing / Language / Visual special needs []  -  0 Assessment of Community assistance (transportation, D/C planning, etc.) []  - 0 Additional assistance / Altered mentation []  - 0 Support Surface(s) Assessment (bed, cushion, seat, etc.) INTERVENTIONS - Wound Cleansing / Measurement X - Wound Imaging (photographs - any number of wounds) 1 5 []  - 0 Wound Tracing (instead of photographs) []  - 0 Simple Wound Measurement - one wound X- 2 5 Complex Wound Measurement - multiple wounds []  - 0 Simple Wound Cleansing - one wound X- 2 5 Complex Wound Cleansing - multiple wounds INTERVENTIONS - Wound Dressings []  - Small Wound Dressing one or multiple wounds 0 []  - 0 Medium Wound Dressing one or multiple wounds X- 1 20 Large Wound Dressing one or multiple wounds []  - 0 Application of Medications - injection INTERVENTIONS - Miscellaneous []  - External ear exam 0 []  - 0 Specimen Collection (cultures, biopsies, blood, body fluids, etc.) []  - 0 Specimen(s) / Culture(s) sent or taken to Lab for analysis []  - 0 Patient Transfer (multiple staff / Civil Service fast streamer / Similar devices) []  - 0 Simple Staple / Suture removal (25 or less) []  - 0 Complex Staple / Suture removal (26 or more) []  - 0 Hypo / Hyperglycemic Management (close monitor of Blood Glucose) []  - 0 Ankle / Brachial Index (ABI) - do not check if billed separately Has the patient been seen at the hospital within the last three years: Yes Total Score: 150 Level Of Care: New/Established - Level 4 Electronic Signature(s) Signed: 03/23/2018 5:12:41 PM By: Montey Hora Entered By: Montey Hora on 03/23/2018 14:17:25 Wiedel, [name]  ([id number]) -------------------------------------------------------------------------------- Encounter Discharge Information Details Patient Name: Scarboro, Javia C. Date of Service: 03/23/2018 1:00 PM Medical Record Number: [id number] Patient Account Number: [id number] Date of Birth/Sex: Sep 16, 1927 (84 y.o. F) Treating RN: Montey Hora Primary Care Hesston Hitchens: Ria Bush Other Clinician: Referring Cynthya Yam: Ria Bush Treating Eleora Sutherland/Extender: Melburn Hake, HOYT Weeks in Treatment: 0 Encounter Discharge Information Items Discharge Condition: Stable Ambulatory Status: Wheelchair Discharge Destination: Home Transportation: Private Auto Accompanied By: dtr in law Schedule Follow-up Appointment: Yes Clinical Summary of Care: Electronic Signature(s) Signed: 03/23/2018 2:19:05 PM By: Montey Hora Entered By: Montey Hora on 03/23/2018 14:19:04 Espinal, [name] ([id number]) -------------------------------------------------------------------------------- Lower Extremity Assessment Details Patient Name: Fodge, Abigial C. Date of Service: 03/23/2018 1:00 PM Medical Record Number: [id number] Patient Account Number: [id number] Date of Birth/Sex: Nov 21, 1927 (84 y.o. F) Treating RN: Secundino Ginger Primary Care Norman Bier: Ria Bush Other Clinician: Referring Naamah Boggess: Ria Bush Treating Homer Miller/Extender: Melburn Hake, HOYT Weeks in Treatment: 0 Edema Assessment Assessed: [Left: No] [Right: No] [Left: Edema] [Right: :] Calf Left: Right: Point of Measurement: 30 cm From Medial Instep 29.9 cm 34.5 cm Ankle Left: Right: Point of Measurement: 10 cm From Medial Instep 19.4 cm 19.8 cm Vascular Assessment Claudication: Claudication Assessment [Left:None] [Right:None] Pulses: Dorsalis Pedis Palpable: [Left:Yes] [Right:Yes] Posterior Tibial Extremity colors, hair growth, and conditions: Extremity Color: [Left:Hyperpigmented] [Right:Red] Hair Growth on Extremity:  [Left:No] [Right:No] Temperature of Extremity: [Left:Cool] [Right:Cool] Capillary Refill: [Left:< 3 seconds] [Right:< 3 seconds] Toe Nail Assessment Left: Right: Thick: Yes Yes Discolored: Yes Yes Deformed: Yes Yes Improper Length and Hygiene: Yes Yes Notes unable to obtain ABI to right d/t pain and swelling, left fading pulses. Electronic Signature(s) Signed: 03/23/2018 4:48:15 PM By: Secundino Ginger Entered By: Secundino Ginger on 03/23/2018 13:38:12 Cunning, [name] ([id number]) -------------------------------------------------------------------------------- Multi Wound Chart Details Patient Name: Dedrick, Emanie C. Date of Service: 03/23/2018 1:00 PM Medical Record Number: [id number] Patient Account Number: [id number] Date of Birth/Sex: 06/29/1927 (84 y.o.  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] [8:Diabetic Wound/Ulcer of the Lower Extremity] [N/A:N/A] Comorbid History: [7:Cataracts, Anemia, Arrhythmia, Coronary Artery Disease, Hypertension, Myocardial Infarction, Type II Diabetes, Osteoarthritis] [8:Cataracts, Anemia, Arrhythmia, Coronary Artery Disease, Hypertension, Myocardial Infarction, Type II  Diabetes, Osteoarthritis] [N/A:N/A] Date Acquired: [7:03/02/2018] [8:03/09/2018] [N/A:N/A] Weeks of Treatment: [7:0] [8:0] [N/A:N/A] Wound Status: [7:Open] [8:Open] [N/A:N/A] Measurements L x W x D [7:1.5x1.5x0.1] [8:2x1.2x0.3]  [N/A:N/A] (cm) Area (cm) : [7:1.767] [8:1.885] [N/A:N/A] Volume (cm) : [7:0.177] [8:0.565] [N/A:N/A] % Reduction in Area: [7:0.00%] [8:0.00%] [N/A:N/A] % Reduction in Volume: [7:0.00%] [8:0.00%] [N/A:N/A] Classification: [7:Grade 2] [8:Grade 2] [N/A:N/A] Exudate Amount: [7:Large] [8:Large] [N/A:N/A] Exudate Type: [7:Serous] [8:Serous] [N/A:N/A] Exudate Color: [7:amber] [8:amber] [N/A:N/A] Wound Margin: [7:Distinct, outline attached] [8:Distinct, outline attached] [N/A:N/A] Granulation Amount: [7:Medium (34-66%)] [8:None Present (0%)] [N/A:N/A] Necrotic Amount: [7:Medium (34-66%)] [8:Large (67-100%)] [N/A:N/A] Exposed Structures: [7:Fat Layer (Subcutaneous Tissue) Exposed: Yes Fascia: No Tendon: No Muscle: No Joint: No Bone: No] [8:Fat Layer (Subcutaneous Tissue) Exposed: Yes Fascia: No Tendon: No Muscle: No Joint: No Bone: No] [N/A:N/A] Periwound Skin Texture: [7:Excoriation: No Induration: No Callus: No Crepitus: No] [8:Excoriation: No Induration: No Callus: No Crepitus: No] [N/A:N/A] Rash: No Rash: No Scarring: No Scarring: No Periwound Skin Moisture: Maceration: No Maceration: No N/A Dry/Scaly: No Dry/Scaly: No Periwound Skin Color: Erythema: Yes Erythema: Yes N/A Atrophie Blanche: No Atrophie Blanche: No Cyanosis: No Cyanosis: No Ecchymosis: No Ecchymosis: No Hemosiderin Staining: No Hemosiderin Staining: No Mottled: No Mottled: No Pallor: No Pallor: No Rubor: No Rubor: No Erythema Location: Circumferential Circumferential N/A Temperature: Hot Hot N/A Tenderness on Palpation: No No N/A Wound Preparation: Ulcer Cleansing: Ulcer Cleansing: N/A Rinsed/Irrigated with Saline Rinsed/Irrigated with Saline Topical Anesthetic Applied: Topical Anesthetic Applied: Other: lidocaine 4% Other: lidocaine 4% Treatment Notes Electronic Signature(s) Signed: 03/23/2018 5:12:41 PM By: Montey Hora Entered By: Montey Hora on 03/23/2018 13:48:36 Jarrett, [name]  ([id number]) -------------------------------------------------------------------------------- [place] Details Patient Name: Schriever, Allyna C. Date of Service: 03/23/2018 1:00 PM Medical Record Number: [id number] Patient Account Number: [id number] Date of Birth/Sex: 09-13-1927 (84 y.o. F) Treating RN: Montey Hora Primary Care Kada Friesen: Ria Bush Other Clinician: Referring Travone Georg: Ria Bush Treating Tressie Ragin/Extender: Melburn Hake, HOYT Weeks in Treatment: 0 Active Inactive Abuse / Safety / Falls / Self Care Management Nursing Diagnoses: Potential for falls Goals: Patient will not experience any injury related to falls Date Initiated: 03/23/2018 Target Resolution Date: 06/09/2018 Goal Status: Active Interventions: Assess fall risk on admission and as needed Notes: Orientation to the Wound Care Program Nursing Diagnoses: Knowledge deficit related to the wound healing center program Goals: Patient/caregiver will verbalize understanding of the [facility] Date Initiated: 03/23/2018 Target Resolution Date: 06/09/2018 Goal Status: Active Interventions: Provide education on orientation to the wound center Notes: Pain, Acute or Chronic Nursing Diagnoses: Pain, acute or chronic: actual or potential Goals: Patient will verbalize adequate pain control and receive pain control interventions during procedures as needed Date Initiated: 03/23/2018 Target Resolution Date: 06/09/2018 Goal Status: Active Interventions: Assess comfort goal upon admission Blanchfield, Abbeygail C. ([id number]) Notes: Wound/Skin Impairment Nursing Diagnoses: Impaired tissue integrity Goals: Ulcer/skin breakdown will heal within  14 weeks Date Initiated: 03/23/2018 Target Resolution Date: 06/09/2018 Goal Status: Active Interventions: Assess patient/caregiver ability to obtain necessary supplies Assess patient/caregiver ability to perform ulcer/skin care regimen  upon admission and as needed Assess ulceration(s) every visit Notes: Electronic Signature(s) Signed: 03/23/2018 5:12:41 PM By: Montey Hora Entered By: Montey Hora on 03/23/2018 13:48:19 Melkonian, Ericca C. ([id number]) -------------------------------------------------------------------------------- Pain Assessment Details Patient Name: Apolinar, Mattea C. Date of Service: 03/23/2018 1:00 PM Medical Record Number: [id number] Patient Account Number: [id number] Date of Birth/Sex: 12-09-1927 (84 y.o. F) Treating RN: Secundino Ginger Primary Care Jill Stopka: Ria Bush Other Clinician: Referring Aslee Such: Ria Bush Treating Isak Sotomayor/Extender: STONE III, HOYT Weeks in Treatment: 0 Active Problems Location of Pain Severity and Description of Pain Patient Has [name] Yes Site Locations Pain Location: Pain in Ulcers Rate the pain. Current Pain Level: 9 Pain Management and Medication Current Pain Management: Notes encouraged to see primary PRN. Electronic Signature(s) Signed: 03/23/2018 4:48:15 PM By: Secundino Ginger Entered By: Secundino Ginger on 03/23/2018 13:06:00 Martinezgarcia, [name] ([id number]) -------------------------------------------------------------------------------- Patient/Caregiver Education Details Patient Name: Zinda, Shalisha C. Date of Service: 03/23/2018 1:00 PM Medical Record Number: [id number] Patient Account Number: [id number] Date of Birth/Gender: [birth year] (84 y.o. F) Treating RN: Montey Hora Primary Care Physician: Ria Bush Other Clinician: Referring Physician: Ria Bush Treating Physician/Extender: Melburn Hake, HOYT Weeks in Treatment: 0 Education Assessment Education Provided To: Patient Education Topics Provided Wound/Skin Impairment: Handouts: Other: reportable s/s Methods: Demonstration, Explain/Verbal Responses: State content correctly Electronic Signature(s) Signed: 03/23/2018 5:12:41 PM By: Montey Hora Entered By: Montey Hora  on 03/23/2018 14:20:02 Doane, Maika Loletha Grayer ([id number]) -------------------------------------------------------------------------------- Wound Assessment Details Patient Name: Pippen, Taleshia C. Date of Service: 03/23/2018 1:00 PM Medical Record Number: [id number] Patient Account Number: [id number] Date of Birth/Sex: [birth year] (84 y.o. F) Treating RN: Secundino Ginger Primary Care Nahsir Venezia: Ria Bush Other Clinician: Referring Bijon Mineer: Ria Bush Treating Montez Stryker/Extender: STONE III, HOYT Weeks in Treatment: 0 Wound Status Wound Number: 7 Primary Diabetic Wound/Ulcer of the Lower Extremity Etiology: Wound Location: Right Lower Leg - Medial Wound Open Wounding Event: Trauma Status: Date Acquired: 03/02/2018 Comorbid Cataracts, Anemia, Arrhythmia, Coronary Artery Weeks Of Treatment: 0 History: Disease, Hypertension, Myocardial Infarction, Clustered Wound: No Type II Diabetes, Osteoarthritis Photos Photo Uploaded By: Gretta Cool, BSN, RN, CWS, Kim on 03/23/2018 14:52:08 Wound Measurements Length: (cm) 1.5 % Reducti Width: (cm) 1.5 % Reducti Depth: (cm) 0.1 Tunneling Area: (cm) 1.767 Undermin Volume: (cm) 0.177 on in Area: 0% on in Volume: 0% : No ing: No Wound Description Classification: Grade 2 Wound Margin: Distinct, outline attached Exudate Amount: Large Exudate Type: Serous Exudate Color: amber Wound Bed Granulation Amount: Medium (34-66%) Exposed Structure Necrotic Amount: Medium (34-66%) Fascia Exposed: No Necrotic Quality: Adherent Slough Fat Layer (Subcutaneous Tissue) Exposed: Yes Tendon Exposed: No Muscle Exposed: No Joint Exposed: No Bone Exposed: No Periwound Skin Texture Siragusa, Alesi C. ([id number]) Texture Color No Abnormalities Noted: No No Abnormalities Noted: No Callus: No Atrophie Blanche: No Crepitus: No Cyanosis: No Excoriation: No Ecchymosis: No Induration: No Erythema: Yes Rash: No Erythema Location:  Circumferential Scarring: No Hemosiderin Staining: No Mottled: No Moisture Pallor: No No Abnormalities Noted: No Rubor: No Dry / Scaly: No Maceration: No Temperature / Pain Temperature: Hot Wound Preparation Ulcer Cleansing: Rinsed/Irrigated with Saline Topical Anesthetic Applied: Other: lidocaine 4%, Treatment Notes Wound #7 (Right, Medial Lower Leg) Notes abd, kerlix and netting Electronic Signature(s) Signed: 03/23/2018 2:07:05 PM By: Worthy Keeler PA-C Signed: 03/23/2018 4:48:15 PM By: Secundino Ginger Entered By: Worthy Keeler on 03/23/2018 13:39:27 Saulters, Shalaina  C. ([id number]) -------------------------------------------------------------------------------- Wound Assessment Details Patient Name: Near, Tolulope C. Date of Service: 03/23/2018 1:00 PM Medical Record Number: [id number] Patient Account Number: [id number] Date of Birth/Sex: [birth year] (84 y.o. F) Treating RN: Secundino Ginger Primary Care Ludia Gartland: Ria Bush Other Clinician: Referring Antania Hoefling: Ria Bush Treating Lakota Schweppe/Extender: STONE III, HOYT Weeks in Treatment: 0 Wound Status Wound Number: 8 Primary Diabetic Wound/Ulcer of the Lower Extremity Etiology: Wound Location: Right Lower Leg - Lateral Wound Open Wounding Event: Trauma Status: Date Acquired: 03/09/2018 Comorbid Cataracts, Anemia, Arrhythmia, Coronary Artery Weeks Of Treatment: 0 History: Disease, Hypertension, Myocardial Infarction, Clustered Wound: No Type II Diabetes, Osteoarthritis Photos Photo Uploaded By: Gretta Cool, BSN, RN, CWS, Kim on 03/23/2018 14:52:09 Wound Measurements Length: (cm) 2 % Reducti Width: (cm) 1.2 % Reducti Depth: (cm) 0.3 Tunneling Area: (cm) 1.885 Undermin Volume: (cm) 0.565 on in Area: 0% on in Volume: 0% : No ing: No Wound Description Classification: Grade 2 Foul Odor Wound Margin: Distinct, outline attached Slough/Fi Exudate Amount: Large Exudate Type: Serous Exudate Color: amber After  Cleansing: No brino No Wound Bed Granulation Amount: None Present (0%) Exposed Structure Necrotic Amount: Large (67-100%) Fascia Exposed: No Necrotic Quality: Adherent Slough Fat Layer (Subcutaneous Tissue) Exposed: Yes Tendon Exposed: No Muscle Exposed: No Joint Exposed: No Bone Exposed: No Periwound Skin Texture Jun, Oscar C. ([id number]) Texture Color No Abnormalities Noted: No No Abnormalities Noted: No Callus: No Atrophie Blanche: No Crepitus: No Cyanosis: No Excoriation: No Ecchymosis: No Induration: No Erythema: Yes Rash: No Erythema Location: Circumferential Scarring: No Hemosiderin Staining: No Mottled: No Moisture Pallor: No No Abnormalities Noted: No Rubor: No Dry / Scaly: No Maceration: No Temperature / Pain Temperature: Hot Wound Preparation Ulcer Cleansing: Rinsed/Irrigated with Saline Topical Anesthetic Applied: Other: lidocaine 4%, Treatment Notes Wound #8 (Right, Lateral Lower Leg) Notes abd, kerlix and netting Electronic Signature(s) Signed: 03/23/2018 2:07:05 PM By: Worthy Keeler PA-C Signed: 03/23/2018 4:48:15 PM By: Secundino Ginger Entered By: Worthy Keeler on 03/23/2018 13:39:56 Heigl, [name] ([id number]) -------------------------------------------------------------------------------- Vitals Details Patient Name: [name], Jaidon C. Date of Service: 03/23/2018 1:00 PM Medical Record Number: [id number] Patient Account Number: [id number] Date of Birth/Sex: December 06, 1927 (84 y.o. F) Treating RN: Secundino Ginger Primary Care Danniel Grenz: Ria Bush Other Clinician: Referring Kalee Broxton: Ria Bush Treating Yeraldin Litzenberger/Extender: STONE III, HOYT Weeks in Treatment: 0 Vital Signs Time Taken: 13:08 Temperature (F): 97.6 Pulse (bpm): 70 Respiratory Rate (breaths/min): 18 Blood Pressure (mmHg): 130/60 Reference Range: 80 - 120 mg / dl Electronic Signature(s) Signed: 03/23/2018 4:48:15 PM By: Secundino Ginger Entered BySecundino Ginger on  03/23/2018 13:09:58

## 2018-03-25 NOTE — Progress Notes (Signed)
[facility] NAME: [name]    MR#:  [id number]  DATE OF BIRTH:  1928-01-30  SUBJECTIVE:  CHIEF COMPLAINT: Patient reports redness of the right leg is improving Feeling weak.  Daughter-in-law at bedside Out of bed to chair denies any other complaints  REVIEW OF SYSTEMS:  CONSTITUTIONAL: No fever, fatigue or weakness.  EYES: No blurred or double vision.  EARS, NOSE, AND THROAT: No tinnitus or ear pain.  RESPIRATORY: No cough, shortness of breath, wheezing or hemoptysis.  CARDIOVASCULAR: No chest pain, orthopnea, edema.  GASTROINTESTINAL: No nausea, vomiting, diarrhea or abdominal pain.  GENITOURINARY: No dysuria, hematuria.  ENDOCRINE: No polyuria, nocturia,  HEMATOLOGY: No anemia, easy bruising or bleeding SKIN: No rash or lesion. MUSCULOSKELETAL: No joint pain or arthritis.   NEUROLOGIC: No tingling, numbness, weakness.  PSYCHIATRY: No anxiety or depression.   DRUG ALLERGIES:   Allergies  Allergen Reactions  . Metformin And Related Diarrhea    VITALS:  Blood pressure (!) 162/73, pulse 80, temperature 98.1 F (36.7 C), temperature source Oral, resp. rate 18, height 4\' 9"  (1.448 m), weight 65.8 kg, SpO2 95 %.  PHYSICAL EXAMINATION:  GENERAL:  84 y.o.-year-old patient lying in the bed with no acute distress.  EYES: Pupils equal, round, reactive to light and accommodation. No scleral icterus. Extraocular muscles intact.  HEENT: Head atraumatic, normocephalic. Oropharynx and nasopharynx clear.  NECK:  Supple, no jugular venous distention. No thyroid enlargement, no tenderness.  LUNGS: Normal breath sounds bilaterally, no wheezing, rales,rhonchi or crepitation. No use of accessory muscles of respiration.  CARDIOVASCULAR: S1, S2 normal. No murmurs, rubs, or gallops.  ABDOMEN: Soft, nontender, nondistended. Bowel sounds present. No organomegaly or mass.  EXTREMITIES: No pedal edema, cyanosis, or clubbing.   NEUROLOGIC: Awake, alert and oriented x3 sensation intact. Gait not checked.  PSYCHIATRIC: The patient is alert and oriented x 3.  SKIN: Right leg with medial and lateral wounds with purulent discharge erythema, minimal tenderness is present, no calf tenderness   LABORATORY PANEL:   CBC Recent Labs  Lab 03/23/18 1452  WBC 10.6*  HGB 11.5*  HCT 37.1  PLT 318   ------------------------------------------------------------------------------------------------------------------  Chemistries  Recent Labs  Lab 03/23/18 1452 03/25/18 0445  NA 137  --   K 4.7  --   CL 104  --   CO2 25  --   GLUCOSE 75  --   BUN 21  --   CREATININE 0.82 0.79  CALCIUM 8.9  --    ------------------------------------------------------------------------------------------------------------------  Cardiac Enzymes No results for input(s): TROPONINI in the last 168 hours. ------------------------------------------------------------------------------------------------------------------  RADIOLOGY:  Dg Tibia/fibula Right  Result Date: 03/23/2018 CLINICAL DATA:  Right lower extremity edema and pain EXAM: RIGHT TIBIA AND FIBULA - 2 VIEW COMPARISON:  None. FINDINGS: No fracture or malalignment. Small plantar calcaneal spur and posterior enthesophyte. No periostitis or bone destruction. Indented appearance of the soft tissues at the proximal and distal lateral leg and the mid medial leg, presumably corresponding to the history of wounds. No soft tissue emphysema. Clips in the soft tissues and vascular calcification IMPRESSION: 1. No acute osseous abnormality 2. Soft tissue indentation within the proximal mid and lower leg presumably corresponding to the history of wounds Electronically Signed   By: Donavan Foil M.D.   On: 03/23/2018 16:32   US Venous Img Lower Unilateral Right  Result Date: 03/23/2018 CLINICAL DATA:  Right lower extremity pain and edema. Evaluate for DVT. EXAM: RIGHT LOWER EXTREMITY VENOUS  DOPPLER ULTRASOUND TECHNIQUE: Gray-scale sonography with graded compression, as well as color Doppler and duplex ultrasound were performed to evaluate the lower extremity deep venous systems from the level of the common femoral vein and including the common femoral, femoral, profunda femoral, popliteal and calf veins including the posterior tibial, peroneal and gastrocnemius veins when visible. The superficial great saphenous vein was also interrogated. Spectral Doppler was utilized to evaluate flow at rest and with distal augmentation maneuvers in the common femoral, femoral and popliteal veins. COMPARISON:  None. FINDINGS: Contralateral Common Femoral Vein: Respiratory phasicity is normal and symmetric with the symptomatic side. No evidence of thrombus. Normal compressibility. Common Femoral Vein: No evidence of thrombus. Normal compressibility, respiratory phasicity and response to augmentation. Saphenofemoral Junction: No evidence of thrombus. Normal compressibility and flow on color Doppler imaging. Profunda Femoral Vein: No evidence of thrombus. Normal compressibility and flow on color Doppler imaging. Femoral Vein: No evidence of thrombus. Normal compressibility, respiratory phasicity and response to augmentation. Popliteal Vein: No evidence of thrombus. Normal compressibility, respiratory phasicity and response to augmentation. Calf Veins: No evidence of thrombus. Normal compressibility and flow on color Doppler imaging. Superficial Great Saphenous Vein: No evidence of thrombus. Normal compressibility. Venous Reflux:  None. Other Findings: Minimal amount of subcutaneous edema is noted at the level of the right calf. IMPRESSION: No evidence of DVT within the right lower extremity Electronically Signed   By: Sandi Mariscal M.D.   On: 03/23/2018 16:49    EKG:   Orders placed or performed in visit on 02/21/17  . EKG 12-Lead    ASSESSMENT AND PLAN:     1.  Cellulitis: Right leg swelling and tenderness  due to nonhealing medial and lateral ankle wounds. Clinically improving, continue wound care  No signs or symptoms of sepsis or abscess.   DVT ruled out with negative venous Dopplers  wound encompasses more than one third of the right lower extremity and she is failed outpatient treatment.   Continue cefepime, vancomycin.  Narrow coverage once MRSA ruled out. Wound care culture with moderate Staphylococcus aureus, gram-positive cocci susceptibity to follow 2.  Coronary artery disease: Stable; continue aspirin.  Nitroglycerin as needed. 3.  Hypertension: Controlled; continue ramipril and metoprolol 4.  Diabetes mellitus type 2: Hold oral hypoglycemic agents.  Sliding scale insulin while hospitalized 5.  Hyperlipidemia: Continue statin therapy 6.  Generalized weakness PT consult placed 7.  Anxiety Xanax as needed her home medication    DVT prophylaxis: Lovenox   All the records are reviewed and case discussed with Care Management/Social Workerr. Management plans discussed with the patient, daughter-in-law at bedside and they are in agreement.  CODE STATUS: fc   TOTAL TIME TAKING CARE OF THIS PATIENT: 36 minutes.   POSSIBLE D/C IN DAYS, DEPENDING ON CLINICAL CONDITION.  Note: This dictation was prepared with Dragon dictation along with smaller phrase technology. Any transcriptional errors that result from this process are unintentional.   Nicholes Mango M.D on 03/25/2018 at 3:14 PM  Between 7am to 6pm - Pager - [phone] After 6pm go to [email or web address] - password [email or web address] [facility]  [facility]  Office  [phone]  CC: Primary care physician; Ria Bush, MD

## 2018-03-25 NOTE — Progress Notes (Signed)
CECILEE, ROSNER ([id number]) Visit Report for 03/23/2018 Chief Complaint Document Details Patient Name: [name], [name] Date of Service: 03/23/2018 1:00 PM Medical Record Number: [id number] Patient Account Number: [id number] Date of Birth/Sex: 1928-01-06 (84 y.o. F) Treating RN: Montey Hora Primary Care Provider: Ria Bush Other Clinician: Referring Provider: Ria Bush Treating Provider/Extender: Melburn Hake, Brennin Durfee Weeks in Treatment: 0 Information Obtained from: Patient Chief Complaint RLE wounds Electronic Signature(s) Signed: 03/23/2018 2:07:05 PM By: Worthy Keeler PA-C Entered By: Worthy Keeler on 03/23/2018 13:37:46 Scarbro, [name] ([id number]) -------------------------------------------------------------------------------- HPI Details Patient Name: Driscoll, Karlena C. Date of Service: 03/23/2018 1:00 PM Medical Record Number: [id number] Patient Account Number: [id number] Date of Birth/Sex: 28-Dec-1927 (84 y.o. F) Treating RN: Montey Hora Primary Care Provider: Ria Bush Other Clinician: Referring Provider: Ria Bush Treating Provider/Extender: Melburn Hake, Emeterio Balke Weeks in Treatment: 0 History of Present Illness HPI Description: Readmission: 03/23/18 on evaluation today patient presents for readmission entire clinic concerning issues that she has been having for the past couple of weeks in regard to her right lower extremity. The medial injury actually occurred when her dog tried to take a blanket from her and subsequently scratched her with his toenail. Subsequently the lateral injury was a wound where she dropped a knife in her kitchen and hit the side of her leg. With that being said both have been open for some time and home health nurse that has been coming out marked for her where the redness was several days ago. Nonetheless this has spread will be on the margin of what was marked. She was placed on Keflex which was started two days  ago but at this point it does not seem to have helped with any improvement in the overall appearance of the wounds. No fevers, chills, nausea, or vomiting noted at this time. Electronic Signature(s) Signed: 03/23/2018 2:07:05 PM By: Worthy Keeler PA-C Entered By: Worthy Keeler on 03/23/2018 14:00:42 Wisnewski, [name] ([id number]) -------------------------------------------------------------------------------- Physical Exam Details Patient Name: Allinson, Ahni C. Date of Service: 03/23/2018 1:00 PM Medical Record Number: [id number] Patient Account Number: [id number] Date of Birth/Sex: 1927/08/08 (84 y.o. F) Treating RN: Montey Hora Primary Care Provider: Ria Bush Other Clinician: Referring Provider: Ria Bush Treating Provider/Extender: STONE III, Tattiana Fakhouri Weeks in Treatment: 0 Constitutional patient is hypertensive.. pulse regular and within target range for patient.Marland Kitchen respirations regular, non-labored and within target range for patient.Marland Kitchen temperature within target range for patient.. Well-nourished and well-hydrated in no acute distress. Eyes conjunctiva clear no eyelid edema noted. pupils equal round and reactive to light and accommodation. Ears, Nose, Mouth, and Throat no gross abnormality of ear auricles or external auditory canals. normal hearing noted during conversation. mucus membranes moist. Respiratory normal breathing without difficulty. clear to auscultation bilaterally. Cardiovascular regular rate and rhythm with normal S1, S2. 2+ pitting edema of the bilateral lower extremities. Gastrointestinal (GI) soft, non-tender, non-distended, +BS. no ventral hernia noted. Musculoskeletal Patient unable to walk without assistance. Psychiatric this patient is able to make decisions and demonstrates good insight into disease process. Alert and Oriented x 3. pleasant and cooperative. Notes Upon evaluation of the patient's right lower extremity actually appears  that the epidermis may have separated from the lower layers of tissue this is not just interstitial fluid buildup such as would normally be seen with venous stasis. Subsequently I feel like this is related to the infection and I am very concerned that this may actually cause the outer skin which seems to be almost blistering to separate  from the underlying tissue layers causing a significant open wound. For this reason I think that it may be best for [place] to have the patient go to the ER for further evaluation and treatment of this area. Again I'm not even sure that the current antibiotic regimen is the most appropriate thing for her. Electronic Signature(s) Signed: 03/23/2018 2:07:05 PM By: Worthy Keeler PA-C Entered By: Worthy Keeler on 03/23/2018 14:02:31 Largent, [name] ([id number]) -------------------------------------------------------------------------------- Physician Orders Details Patient Name: Gohr, Vannie C. Date of Service: 03/23/2018 1:00 PM Medical Record Number: [id number] Patient Account Number: [id number] Date of Birth/Sex: 06/13/1927 (84 y.o. F) Treating RN: Montey Hora Primary Care Provider: Ria Bush Other Clinician: Referring Provider: Ria Bush Treating Provider/Extender: Melburn Hake, Anju Sereno Weeks in Treatment: 0 Verbal / Phone Orders: No Diagnosis Coding ICD-10 Coding Code Description I87.331 Chronic venous hypertension (idiopathic) with ulcer and inflammation of right lower extremity E11.622 Type 2 diabetes mellitus with other skin ulcer L97.812 Non-pressure chronic ulcer of other part of right lower leg with fat layer exposed L03.115 Cellulitis of right lower limb Wound Cleansing Wound #7 Right,Medial Lower Leg o Clean wound with Normal Saline. Wound #8 Right,Lateral Lower Leg o Clean wound with Normal Saline. Primary Wound Dressing Wound #7 Right,Medial Lower Leg o ABD Pad Wound #8 Right,Lateral Lower Leg o ABD Pad Secondary  Dressing Wound #7 Right,Medial Lower Leg o ABD and Kerlix/Conform - secured with netting Wound #8 Right,Lateral Lower Leg o ABD and Kerlix/Conform - secured with netting Follow-up Appointments Wound #7 Right,Medial Lower Leg o Return Appointment in 1 week. Wound #8 Right,Lateral Lower Leg o Return Appointment in 1 week. Electronic Signature(s) Signed: 03/23/2018 2:07:05 PM By: Worthy Keeler PA-C Entered By: Worthy Keeler on 03/23/2018 14:06:35 Kauzlarich, [name] ([id number]) -------------------------------------------------------------------------------- Problem List Details Patient Name: Shadoan, Oneal C. Date of Service: 03/23/2018 1:00 PM Medical Record Number: [id number] Patient Account Number: [id number] Date of Birth/Sex: [birth year] (84 y.o. F) Treating RN: Montey Hora Primary Care Provider: Ria Bush Other Clinician: Referring Provider: Ria Bush Treating Provider/Extender: Melburn Hake, Jedd Schulenburg Weeks in Treatment: 0 Active Problems ICD-10 Evaluated Encounter Code Description Active Date Today Diagnosis I87.331 Chronic venous hypertension (idiopathic) with ulcer and 03/23/2018 No Yes inflammation of right lower extremity E11.622 Type 2 diabetes mellitus with other skin ulcer 03/23/2018 No Yes L97.812 Non-pressure chronic ulcer of other part of right lower leg 03/23/2018 No Yes with fat layer exposed L03.115 Cellulitis of right lower limb 03/23/2018 No Yes Inactive Problems Resolved Problems Electronic Signature(s) Signed: 03/23/2018 2:07:05 PM By: Worthy Keeler PA-C Entered By: Worthy Keeler on 03/23/2018 13:37:30 Bueso, [name] ([id number]) -------------------------------------------------------------------------------- Progress Note Details Patient Name: Elliston, Makayela C. Date of Service: 03/23/2018 1:00 PM Medical Record Number: [id number] Patient Account Number: [id number] Date of Birth/Sex: 16-Jan-1928 (84 y.o. F) Treating RN:  Montey Hora Primary Care Provider: Ria Bush Other Clinician: Referring Provider: Ria Bush Treating Provider/Extender: Melburn Hake, Aizza Santiago Weeks in Treatment: 0 Subjective Chief Complaint Information obtained from Patient RLE wounds History of Present Illness (HPI) Readmission: 03/23/18 on evaluation today patient presents for readmission entire clinic concerning issues that she has been having for the past couple of weeks in regard to her right lower extremity. The medial injury actually occurred when her dog tried to take a blanket from her and subsequently scratched her with his toenail. Subsequently the lateral injury was a wound where she dropped a knife in her kitchen and hit the side of her leg. With that being said  both have been open for some time and home health nurse that has been coming out marked for her where the redness was several days ago. Nonetheless this has spread will be on the margin of what was marked. She was placed on Keflex which was started two days ago but at this point it does not seem to have helped with any improvement in the overall appearance of the wounds. No fevers, chills, nausea, or vomiting noted at this time. Patient History Information obtained from Patient. Allergies No Known Allergies Family History Diabetes - Father,Siblings,Child, Hypertension - Child, Lung Disease - Child, Stroke - Mother, No family history of Cancer, Heart Disease, Kidney Disease, Seizures, Thyroid Problems. Social History Former smoker, Marital Status - Single, Alcohol Use - Never, Drug Use - No History, Caffeine Use - Daily. Medical And Surgical History Notes Constitutional Symptoms (General Health) High Blood Pressure, open heart surgery, gall stone, hysterectomy; Fluid pill, Anxiety Review of Systems (ROS) Eyes Denies complaints or symptoms of Dry Eyes, Vision Changes, Glasses / Contacts. Hematologic/Lymphatic Denies complaints or symptoms of  Bleeding / Clotting Disorders, Human Immunodeficiency Virus. Respiratory Complains or has symptoms of Shortness of Breath - activity. Denies complaints or symptoms of Chronic or frequent coughs. Cardiovascular Complains or has symptoms of LE edema - right leg. Denies complaints or symptoms of Chest pain. Rasmusson, Jasmeen C. ([id number]) Endocrine Denies complaints or symptoms of Hepatitis, Thyroid disease, Polydypsia (Excessive Thirst). Genitourinary Denies complaints or symptoms of Kidney failure/ Dialysis, Incontinence/dribbling. Immunological Denies complaints or symptoms of Hives, Itching. Integumentary (Skin) Denies complaints or symptoms of Wounds, Bleeding or bruising tendency, Breakdown, Swelling. Musculoskeletal Denies complaints or symptoms of Muscle Pain, Muscle Weakness. Neurologic Denies complaints or symptoms of Numbness/parasthesias, Focal/Weakness. Psychiatric Complains or has symptoms of Anxiety, Claustrophobia. Objective Constitutional patient is hypertensive.. pulse regular and within target range for patient.Marland Kitchen respirations regular, non-labored and within target range for patient.Marland Kitchen temperature within target range for patient.. Well-nourished and well-hydrated in no acute distress. Vitals Time Taken: 1:08 PM, Temperature: 97.6 F, Pulse: 70 bpm, Respiratory Rate: 18 breaths/min, Blood Pressure: 130/60 mmHg. Eyes conjunctiva clear no eyelid edema noted. pupils equal round and reactive to light and accommodation. Ears, Nose, Mouth, and Throat no gross abnormality of ear auricles or external auditory canals. normal hearing noted during conversation. mucus membranes moist. Respiratory normal breathing without difficulty. clear to auscultation bilaterally. Cardiovascular regular rate and rhythm with normal S1, S2. 2+ pitting edema of the bilateral lower extremities. Gastrointestinal (GI) soft, non-tender, non-distended, +BS. no ventral hernia  noted. Musculoskeletal Patient unable to walk without assistance. Psychiatric this patient is able to make decisions and demonstrates good insight into disease process. Alert and Oriented x 3. pleasant and cooperative. General Notes: Upon evaluation of the patient's right lower extremity actually appears that the epidermis may have separated from the lower layers of tissue this is not just interstitial fluid buildup such as would normally be seen with venous stasis. Subsequently I feel like this is related to the infection and I am very concerned that this may actually cause the outer skin Gregson, Kenise C. ([id number]) which seems to be almost blistering to separate from the underlying tissue layers causing a significant open wound. For this reason I think that it may be best for [place] to have the patient go to the ER for further evaluation and treatment of this area. Again I'm not even sure that the current antibiotic regimen is the most appropriate thing for her. Integumentary (Hair, Skin) Wound #7 status is  Open. Original cause of wound was Trauma. The wound is located on the Right,Medial Lower Leg. The wound measures 1.5cm length x 1.5cm width x 0.1cm depth; 1.767cm^2 area and 0.177cm^3 volume. There is Fat Layer (Subcutaneous Tissue) Exposed exposed. There is no tunneling or undermining noted. There is a large amount of serous drainage noted. The wound margin is distinct with the outline attached to the wound base. There is medium (34-66%) granulation within the wound bed. There is a medium (34-66%) amount of necrotic tissue within the wound bed including Adherent Slough. The periwound skin appearance exhibited: Erythema. The periwound skin appearance did not exhibit: Callus, Crepitus, Excoriation, Induration, Rash, Scarring, Dry/Scaly, Maceration, Atrophie Blanche, Cyanosis, Ecchymosis, Hemosiderin Staining, Mottled, Pallor, Rubor. The surrounding wound skin color is noted with erythema  which is circumferential. Periwound temperature was noted as Hot. Wound #8 status is Open. Original cause of wound was Trauma. The wound is located on the Right,Lateral Lower Leg. The wound measures 2cm length x 1.2cm width x 0.3cm depth; 1.885cm^2 area and 0.565cm^3 volume. There is Fat Layer (Subcutaneous Tissue) Exposed exposed. There is no tunneling or undermining noted. There is a large amount of serous drainage noted. The wound margin is distinct with the outline attached to the wound base. There is no granulation within the wound bed. There is a large (67-100%) amount of necrotic tissue within the wound bed including Adherent Slough. The periwound skin appearance exhibited: Erythema. The periwound skin appearance did not exhibit: Callus, Crepitus, Excoriation, Induration, Rash, Scarring, Dry/Scaly, Maceration, Atrophie Blanche, Cyanosis, Ecchymosis, Hemosiderin Staining, Mottled, Pallor, Rubor. The surrounding wound skin color is noted with erythema which is circumferential. Periwound temperature was noted as Hot. Assessment Active Problems ICD-10 Chronic venous hypertension (idiopathic) with ulcer and inflammation of right lower extremity Type 2 diabetes mellitus with other skin ulcer Non-pressure chronic ulcer of other part of right lower leg with fat layer exposed Cellulitis of right lower limb Plan Wound Cleansing: Wound #7 Right,Medial Lower Leg: Clean wound with Normal Saline. Wound #8 Right,Lateral Lower Leg: Clean wound with Normal Saline. Primary Wound Dressing: Wound #7 Right,Medial Lower Leg: ABD Pad Wound #8 Right,Lateral Lower Leg: ABD Pad Secondary Dressing: Wound #7 Right,Medial Lower Leg: ABD and Kerlix/Conform - secured with netting Banton, Schae C. ([id number]) Wound #8 Right,Lateral Lower Leg: ABD and Kerlix/Conform - secured with netting Follow-up Appointments: Wound #7 Right,Medial Lower Leg: Return Appointment in 1 week. Wound #8 Right,Lateral Lower  Leg: Return Appointment in 1 week. I discussed with the patient today that I think that going to the ER would be the most appropriate thing. This was also discussed with her daughter-in-law. In the end the patient was in agreement with going to the ER for further evaluation in fact she feels like this may be the best thing to do. Therefore I am sending a copy of this note with her as well. My biggest issue is that I feel like the erythema has worsened and the antibiotics currently have not really reverse this. Subsequently she also has significant fluid buildup in the right lower extremity where it actually feels like the majority of the skin of the right lower extremity has blistered up and there is actually fluctuate with fluid collection underline this region that could be seen just by resting her leg against the table and then lifting it. The fluid will drain from one area to another. Coupled with the erythema I'm concerned about more significant infection. We will subsequently see the patient back for reevaluation depending  on the emergency department evaluation. The patient was in agreement with all the above hopefully they'll be able to get this under good control so that this does not worsen I do not want this whole area of fluctuance of the right lower extremity to open up into a significant wound that would be very difficult for this patient to heal rapidly at best and potentially at all at worst. Electronic Signature(s) Signed: 03/23/2018 2:07:05 PM By: Worthy Keeler PA-C Entered By: Worthy Keeler on 03/23/2018 14:06:43 Caldera, [name] ([id number]) -------------------------------------------------------------------------------- ROS/PFSH Details Patient Name: Kulick, Mikhaela C. Date of Service: 03/23/2018 1:00 PM Medical Record Number: [id number] Patient Account Number: [id number] Date of Birth/Sex: [birth year] (84 y.o. F) Treating RN: Secundino Ginger Primary Care Provider: Ria Bush Other Clinician: Referring Provider: Ria Bush Treating Provider/Extender: STONE III, Rhodia Acres Weeks in Treatment: 0 Information Obtained From Patient Wound History Eyes Complaints and Symptoms: Negative for: Dry Eyes; Vision Changes; Glasses / Contacts Medical History: Positive for: Cataracts - removed Negative for: Glaucoma; Optic Neuritis Hematologic/Lymphatic Complaints and Symptoms: Negative for: Bleeding / Clotting Disorders; Human Immunodeficiency Virus Medical History: Positive for: Anemia Negative for: Hemophilia; Human Immunodeficiency Virus; Lymphedema; Sickle Cell Disease Respiratory Complaints and Symptoms: Positive for: Shortness of Breath - activity Negative for: Chronic or frequent coughs Medical History: Negative for: Aspiration; Asthma; Chronic Obstructive Pulmonary Disease (COPD); Pneumothorax; Sleep Apnea; Tuberculosis Cardiovascular Complaints and Symptoms: Positive for: LE edema - right leg Negative for: Chest pain Medical History: Positive for: Arrhythmia; Coronary Artery Disease; Hypertension; Myocardial Infarction Negative for: Angina; Congestive Heart Failure; Deep Vein Thrombosis; Hypotension; Peripheral Arterial Disease; Peripheral Venous Disease; Phlebitis Endocrine Complaints and Symptoms: Negative for: Hepatitis; Thyroid disease; Polydypsia (Excessive Thirst) Medical History: Positive for: Type II Diabetes Solinger, Staria C. ([id number]) Negative for: Type I Diabetes Time with diabetes: no idea Treated with: Oral agents Blood sugar tested every day: No Genitourinary Complaints and Symptoms: Negative for: Kidney failure/ Dialysis; Incontinence/dribbling Medical History: Negative for: End Stage Renal Disease Immunological Complaints and Symptoms: Negative for: Hives; Itching Medical History: Negative for: Lupus Erythematosus; Raynaudos; Scleroderma Integumentary (Skin) Complaints and Symptoms: Negative for: Wounds;  Bleeding or bruising tendency; Breakdown; Swelling Medical History: Negative for: History of Burn; History of pressure wounds Musculoskeletal Complaints and Symptoms: Negative for: Muscle Pain; Muscle Weakness Medical History: Positive for: Osteoarthritis Negative for: Gout; Rheumatoid Arthritis; Osteomyelitis Neurologic Complaints and Symptoms: Negative for: Numbness/parasthesias; Focal/Weakness Medical History: Negative for: Dementia; Neuropathy; Quadriplegia; Paraplegia; Seizure Disorder Psychiatric Complaints and Symptoms: Positive for: Anxiety; Claustrophobia Constitutional Symptoms (General Health) Medical History: Past Medical History Notes: High Blood Pressure, open heart surgery, gall stone, hysterectomy; Fluid pill, Anxiety Oncologic MAHI, ZABRISKIE ([id number]) Medical History: Negative for: Received Chemotherapy; Received Radiation HBO Extended History Items Eyes: Cataracts Immunizations Pneumococcal Vaccine: Received Pneumococcal Vaccination: Yes Implantable Devices Family and Social History Cancer: No; Diabetes: Yes - Father,Siblings,Child; Heart Disease: No; Hypertension: Yes - Child; Kidney Disease: No; Lung Disease: Yes - Child; Seizures: No; Stroke: Yes - Mother; Thyroid Problems: No; Former smoker; Marital Status - Single; Alcohol Use: Never; Drug Use: No History; Caffeine Use: Daily; Financial Concerns: No; Food, Clothing or Shelter Needs: No; Support System Lacking: No; Transportation Concerns: No; Advanced Directives: Yes (Not Provided); Patient does not want information on Advanced Directives; Living Will: Yes (Not Provided) Electronic Signature(s) Signed: 03/23/2018 2:07:05 PM By: Worthy Keeler PA-C Signed: 03/23/2018 4:48:15 PM By: Secundino Ginger Entered By: Secundino Ginger on 03/23/2018 13:16:17 Neto, [name] ([id number]) -------------------------------------------------------------------------------- SuperBill Details Patient Name: Eimer,  Khristen  C. Date of Service: 03/23/2018 Medical Record Number: [id number] Patient Account Number: [id number] Date of Birth/Sex: 1927-10-28 (84 y.o. F) Treating RN: Montey Hora Primary Care Provider: Ria Bush Other Clinician: Referring Provider: Ria Bush Treating Provider/Extender: Melburn Hake, Donnell Wion Weeks in Treatment: 0 Diagnosis Coding ICD-10 Codes Code Description I87.331 Chronic venous hypertension (idiopathic) with ulcer and inflammation of right lower extremity E11.622 Type 2 diabetes mellitus with other skin ulcer L97.812 Non-pressure chronic ulcer of other part of right lower leg with fat layer exposed L03.115 Cellulitis of right lower limb Facility Procedures CPT4 Code: 20100712 Description: 99214 - WOUND CARE VISIT-LEV 4 EST PT Modifier: Quantity: 1 Physician Procedures CPT4: Description Modifier Quantity Code 1975883 99214 - WC PHYS LEVEL 4 - EST PT 1 ICD-10 Diagnosis Description I87.331 Chronic venous hypertension (idiopathic) with ulcer and inflammation of right lower extremity E11.622 Type 2 diabetes mellitus with  other skin ulcer L97.812 Non-pressure chronic ulcer of other part of right lower leg with fat layer exposed L03.115 Cellulitis of right lower limb Electronic Signature(s) Signed: 03/23/2018 2:17:35 PM By: Montey Hora Signed: 03/24/2018 12:35:32 AM By: Worthy Keeler PA-C Previous Signature: 03/23/2018 2:07:05 PM Version By: Worthy Keeler PA-C Entered By: Montey Hora on 03/23/2018 14:17:34

## 2018-03-25 NOTE — Progress Notes (Signed)
JENEFER, WOERNER ([id number]) Visit Report for 03/23/2018 Abuse/Suicide Risk Screen Details Patient Name: [name], [name] Date of Service: 03/23/2018 1:00 PM Medical Record Number: [id number] Patient Account Number: [id number] Date of Birth/Sex: 1928-03-27 (84 y.o. F) Treating RN: Secundino Ginger Primary Care Dalicia Kisner: Ria Bush Other Clinician: Referring Fronia Depass: Ria Bush Treating Debbora Ang/Extender: STONE III, HOYT Weeks in Treatment: 0 Abuse/Suicide Risk Screen Items Answer Notes pt's daughter in law with pt at this time. Electronic Signature(s) Signed: 03/23/2018 4:48:15 PM By: Secundino Ginger Entered By: Secundino Ginger on 03/23/2018 13:17:02 Lenoir, [name] ([id number]) -------------------------------------------------------------------------------- Activities of Daily Living Details Patient Name: Matsunaga, [name] Date of Service: 03/23/2018 1:00 PM Medical Record Number: [id number] Patient Account Number: [id number] Date of Birth/Sex: [birth year] (84 y.o. F) Treating RN: Secundino Ginger Primary Care Lianah Peed: Ria Bush Other Clinician: Referring Liborio Saccente: Ria Bush Treating Graciemae Delisle/Extender: STONE III, HOYT Weeks in Treatment: 0 Activities of Daily Living Items Answer Activities of Daily Living (Please select one for each item) Drive Automobile Not Able Take Medications Completely Able Use Telephone Completely Able Care for Appearance Completely Able Use Toilet Completely Able Bath / Shower Completely Able Dress Self Completely Able Feed Self Completely Able Walk Completely Able Get In / Out Bed Completely Able Housework Completely Able Prepare Meals Completely Able Handle Money Completely Able Shop for Self Completely Able Electronic Signature(s) Signed: 03/23/2018 4:48:15 PM By: Secundino Ginger Entered By: Secundino Ginger on 03/23/2018 13:18:40 Saad, [name]  ([id number]) -------------------------------------------------------------------------------- Education Assessment Details Patient Name: [name], [name] Date of Service: 03/23/2018 1:00 PM Medical Record Number: [id number] Patient Account Number: [id number] Date of Birth/Sex: Dec 05, 1927 (84 y.o. F) Treating RN: Secundino Ginger Primary Care Tonatiuh Mallon: Ria Bush Other Clinician: Referring Elaine Middleton: Ria Bush Treating Sanii Kukla/Extender: Melburn Hake, HOYT Weeks in Treatment: 0 Learning Preferences/Education Level/Primary Language Learning Preference: Explanation, Demonstration Highest Education Level: High School Preferred Language: English Cognitive Barrier Assessment/Beliefs Language Barrier: No Translator Needed: No Memory Deficit: No Emotional Barrier: No Cultural/Religious Beliefs Affecting Medical Care: No Physical Barrier Assessment Impaired Vision: No Impaired Hearing: Yes Hearing Aid, very HOH Decreased Hand dexterity: No Knowledge/Comprehension Assessment Knowledge Level: High Comprehension Level: High Ability to understand written High instructions: Ability to understand verbal High instructions: Motivation Assessment Anxiety Level: Calm Cooperation: Cooperative Education Importance: Acknowledges Need Interest in Health Problems: Asks Questions Perception: Coherent Willingness to Engage in Self- High Management Activities: Readiness to Engage in Self- High Management Activities: Electronic Signature(s) Signed: 03/23/2018 4:48:15 PM By: Secundino Ginger Entered By: Secundino Ginger on 03/23/2018 13:20:01 Lard, [name] ([id number]) -------------------------------------------------------------------------------- Fall Risk Assessment Details Patient Name: [name], [name] Date of Service: 03/23/2018 1:00 PM Medical Record Number: [id number] Patient Account Number: [id number] Date of Birth/Sex: April 30, 1927 (84 y.o. F) Treating RN: Secundino Ginger Primary Care Abram Sax:  Ria Bush Other Clinician: Referring Cebastian Neis: Ria Bush Treating Esau Fridman/Extender: Melburn Hake, HOYT Weeks in Treatment: 0 Fall Risk Assessment Items Have you had 2 or more falls in the last 12 monthso 0 Yes Have you had any fall that resulted in injury in the last 12 monthso 0 No FALL RISK ASSESSMENT: History of falling - immediate or within 3 months 0 No Secondary diagnosis 0 No Ambulatory aid None/bed rest/wheelchair/nurse 0 No Crutches/cane/walker 0 No Furniture 0 No IV Access/Saline Lock 0 No Gait/Training Normal/bed rest/immobile 0 No Weak 0 No Impaired 0 No Mental Status Oriented to own ability 0 No Electronic Signature(s) Signed: 03/23/2018 4:48:15 PM By: Secundino Ginger Entered By: Secundino Ginger on 03/23/2018 13:20:51 Southall, Alanie C. ([id number]) -------------------------------------------------------------------------------- Foot Assessment  Details Patient Name: [name], [name] Date of Service: 03/23/2018 1:00 PM Medical Record Number: [id number] Patient Account Number: [id number] Date of Birth/Sex: 1927/08/06 (84 y.o. F) Treating RN: Secundino Ginger Primary Care Howard Bunte: Ria Bush Other Clinician: Referring Ladonte Verstraete: Ria Bush Treating Abdelrahman Nair/Extender: STONE III, HOYT Weeks in Treatment: 0 Foot Assessment Items Site Locations + = Sensation present, - = Sensation absent, C = Callus, U = Ulcer R = Redness, W = Warmth, M = Maceration, PU = Pre-ulcerative lesion F = Fissure, S = Swelling, D = Dryness Assessment Right: Left: Other Deformity: No No Prior Foot Ulcer: No No Prior Amputation: No No Charcot Joint: No No Ambulatory Status: Gait: Electronic Signature(s) Signed: 03/23/2018 4:48:15 PM By: Secundino Ginger Entered By: Secundino Ginger on 03/23/2018 13:37:36 Eddington, [name] ([id number]) -------------------------------------------------------------------------------- Nutrition Risk Assessment Details Patient Name: Hovatter, [name] Date of  Service: 03/23/2018 1:00 PM Medical Record Number: [id number] Patient Account Number: [id number] Date of Birth/Sex: 04-19-1927 (84 y.o. F) Treating RN: Secundino Ginger Primary Care Javonte Elenes: Ria Bush Other Clinician: Referring Karema Tocci: Ria Bush Treating Paulo Keimig/Extender: STONE III, HOYT Weeks in Treatment: 0 Height (in): Weight (lbs): Body Mass Index (BMI): Nutrition Risk Assessment Items NUTRITION RISK SCREEN: I have an illness or condition that made me change the kind and/or amount of 0 No food I eat I eat fewer than two meals per day 0 No I eat few fruits and vegetables, or milk products 0 No I have three or more drinks of beer, liquor or wine almost every day 0 No I have tooth or mouth problems that make it hard for me to eat 0 No I don't always have enough money to buy the food I need 0 No I eat alone most of the time 0 No I take three or more different prescribed or over-the-counter drugs a day 0 No Without wanting to, I have lost or gained 10 pounds in the last six months 0 No I am not always physically able to shop, cook and/or feed myself 0 No Nutrition Protocols Good Risk Protocol Moderate Risk Protocol Electronic Signature(s) Signed: 03/23/2018 4:48:15 PM By: Secundino Ginger Entered BySecundino Ginger on 03/23/2018 13:20:58

## 2018-03-26 LAB — GLUCOSE, CAPILLARY
Glucose-Capillary: 114 mg/dL — ABNORMAL HIGH (ref 70–99)
Glucose-Capillary: 175 mg/dL — ABNORMAL HIGH (ref 70–99)
Glucose-Capillary: 171 mg/dL — ABNORMAL HIGH (ref 70–99)
Glucose-Capillary: 138 mg/dL — ABNORMAL HIGH (ref 70–99)

## 2018-03-26 LAB — VANCOMYCIN, TROUGH: Vancomycin Tr: 8 ug/mL — ABNORMAL LOW (ref 15–20)

## 2018-03-26 MED ORDER — VANCOMYCIN HCL IN DEXTROSE 1-5 GM/200ML-% IV SOLN
1000.0000 mg | INTRAVENOUS | Status: DC
  Administered 2018-03-26: 1000 mg via INTRAVENOUS
  Filled 2018-03-26 (×2): qty 200

## 2018-03-26 MED ORDER — INSULIN ASPART 100 UNIT/ML ~~LOC~~ SOLN
0.0000 [IU] | Freq: Three times a day (TID) | SUBCUTANEOUS | Status: DC
  Administered 2018-03-26 (×2): 2 [IU] via SUBCUTANEOUS
  Administered 2018-03-27: 1 [IU] via SUBCUTANEOUS
  Filled 2018-03-26 (×3): qty 1

## 2018-03-26 NOTE — Progress Notes (Addendum)
Pharmacy Antibiotic Note  [name] is a 84 y.o. female admitted on 03/23/2018 with sepsis.  Pharmacy has been consulted for vancomycin and cefepime dosing. Patient is also on metronidazole.  Plan: Vancomycin trough returned 8 mcg/ml. Recalculated Ke 0.047 hr-1, T1/2 14.7 hr. Increase to vancomycin 1000 mg IV Q18H. Predicted trough 16 mcg/ml. Plan is for MRI to r/o osteo the switch to oral doxycycline. Pharmacy will continue to follow and adjust as needed to maintain trough 15 to 20 mcg/ml.   Vancomycin dosing times: 12/20 1000 mg at 1713 12/21 500 mg at 0958 12/22 500 mg at 0252, 2209 12/23 --  The predicted/future dosing times of vancomycin on the Q18H schedule are 12/23 1000 mg at 16:00 12/24 1000 mg at 10:00 12/25 1000 mg at 04:00, 22:00 12/26 1000 mg at 16:00  Height: 4\' 9"  (144.8 cm) Weight: 147 lb 4.3 oz (66.8 kg) IBW/kg (Calculated) : 38.6  Temp (24hrs), Avg:98 F (36.7 C), Min:98 F (36.7 C), Max:98 F (36.7 C)  Recent Labs  Lab 03/23/18 1452 03/25/18 0445 03/26/18 1355  WBC 10.6*  --   --   CREATININE 0.82 0.79  --   VANCOTROUGH  --   --  8*    Estimated Creatinine Clearance: 38.3 mL/min (by C-G formula based on SCr of 0.79 mg/dL).    Allergies  Allergen Reactions  . Metformin And Related Diarrhea    Antimicrobials this admission: Vancomycin 12/20 >>  Cefepime 12/20 >>  Metronidazole 12/20 >>  Dose adjustments this admission: NA  Microbiology results:   Thank you for allowing pharmacy to be a part of this patient's care.  Laural Benes, PharmD, BCPS Clinical Pharmacist 03/26/2018 3:08 PM

## 2018-03-26 NOTE — NC FL2 (Signed)
[place] LEVEL OF CARE SCREENING TOOL     IDENTIFICATION  Patient Name: [name] Birthdate: [birth year] Sex: female Admission Date (Current Location): 03/23/2018  [place] and [place] Number:  Engineering geologist and Address:  [facility], [address] [place], [place] [address]      Provider Number: [id number]  Attending Physician Name and Address:  Bettey Costa, MD  Relative Name and Phone Number:       Current Level of Care: Hospital Recommended Level of Care: [place] Prior Approval Number:    Date Approved/Denied:   PASRR Number: [id number])  Discharge Plan: SNF    Current Diagnoses: Patient Active Problem List   Diagnosis Date Noted  . Cellulitis 03/23/2018  . Advanced directives, counseling/discussion 01/29/2018  . Bilateral hearing loss 01/29/2018  . Spinal stenosis of lumbosacral region 01/08/2018  . Acute right-sided low back pain 12/26/2017  . Leg wound, right, initial encounter 10/30/2017  . Systolic murmur 93/81/8299  . De Quervain's tenosynovitis, left 01/09/2017  . Encounter for chronic pain management 02/09/2016  . Paroxysmal atrial fibrillation (HCC)   . Varicose veins of left lower extremity with both ulcer of calf and inflammation ([place]) 05/22/2015  . Varicose veins of right lower extremity with both ulcer of calf and inflammation ([place]) 01/23/2015  . Diabetes mellitus type 2 with peripheral artery disease ([place])   . Chronic venous insufficiency   . Bleeding from varicose veins of lower extremity, left 02/13/2014  . Diabetic polyneuropathy ([place]) 11/19/2013  . Type 2 diabetes mellitus with neurological manifestations, controlled ([place]) 11/19/2013  . Right shoulder pain 08/19/2013  . Cerumen impaction 04/16/2013  . Abdominal pain 05/14/2012  . Peptic ulcer disease   . Unsteady gait 12/13/2011  . Chronic lower back pain 09/14/2011  . Microcytic anemia 09/06/2010  . Hyperlipidemia  09/06/2010  . GAD (generalized anxiety disorder) 03/15/2010  . Obesity, Class I, BMI 30-34.9 03/15/2010  . Hypertension 03/15/2010  . (HFpEF) heart failure with preserved ejection fraction ([place]) 03/15/2010  . Coronary artery disease     Orientation RESPIRATION BLADDER Height & Weight     Self, Time, Situation, Place  Normal Continent Weight: 147 lb 4.3 oz (66.8 kg) Height:  4\' 9"  (144.8 cm)  BEHAVIORAL SYMPTOMS/MOOD NEUROLOGICAL BOWEL NUTRITION STATUS      Continent Diet(Diet: Heart Healthy/ Carb Modified. )  AMBULATORY STATUS COMMUNICATION OF NEEDS Skin   Extensive Assist Verbally Other (Comment)(non-pressure wound on right leg. )                       Personal Care Assistance Level of Assistance  Bathing, Feeding, Dressing Bathing Assistance: Limited assistance Feeding assistance: Independent Dressing Assistance: Limited assistance     Functional Limitations Info  Sight, Hearing, Speech Sight Info: Adequate Hearing Info: Adequate Speech Info: Adequate    SPECIAL CARE FACTORS FREQUENCY  PT (By licensed PT), OT (By licensed OT)     PT Frequency: (5) OT Frequency: (5)            Contractures      Additional Factors Info  Code Status, Allergies Code Status Info: (Full Code. ) Allergies Info: (Metformin And Related)           Current Medications (03/26/2018):  This is the current hospital active medication list Current Facility-Administered Medications  Medication Dose Route Frequency Provider Last Rate Last Dose  . acetaminophen (TYLENOL) tablet 650 mg  650 mg Oral Q6H PRN Harrie Foreman,  MD   650 mg at 03/25/18 1554   Or  . acetaminophen (TYLENOL) suppository 650 mg  650 mg Rectal Q6H PRN Harrie Foreman, MD      . acidophilus (RISAQUAD) capsule 1 capsule  1 capsule Oral Daily Harrie Foreman, MD   1 capsule at 03/26/18 0844  . ALPRAZolam Duanne Moron) tablet 0.5 mg  0.5 mg Oral QID PRN Harrie Foreman, MD   0.5 mg at 03/26/18 1144  . aspirin  EC tablet 81 mg  81 mg Oral Daily Harrie Foreman, MD   81 mg at 03/26/18 1937  . docusate sodium (COLACE) capsule 100 mg  100 mg Oral BID Harrie Foreman, MD   100 mg at 03/26/18 0827  . enoxaparin (LOVENOX) injection 40 mg  40 mg Subcutaneous Q24H Harrie Foreman, MD   40 mg at 03/25/18 2209  . famotidine (PEPCID) tablet 20 mg  20 mg Oral QHS Harrie Foreman, MD   20 mg at 03/25/18 2202  . furosemide (LASIX) tablet 10 mg  10 mg Oral Daily Harrie Foreman, MD   10 mg at 03/26/18 9024  . HYDROcodone-acetaminophen (NORCO/VICODIN) 5-325 MG per tablet 1 tablet  1 tablet Oral TID PRN Harrie Foreman, MD   1 tablet at 03/26/18 [phone]  . insulin aspart (novoLOG) injection 0-9 Units  0-9 Units Subcutaneous TID WC Bettey Costa, MD   2 Units at 03/26/18 1144  . loratadine (CLARITIN) tablet 10 mg  10 mg Oral Daily PRN Harrie Foreman, MD      . methocarbamol (ROBAXIN) tablet 250-500 mg  250-500 mg Oral TID PRN Harrie Foreman, MD      . metoprolol tartrate (LOPRESSOR) tablet 37.5 mg  37.5 mg Oral BID Harrie Foreman, MD   37.5 mg at 03/26/18 0826  . multivitamin with minerals tablet 1 tablet  1 tablet Oral Daily Harrie Foreman, MD      . nitroGLYCERIN (NITROSTAT) SL tablet 0.4 mg  0.4 mg Sublingual Q5 min PRN Harrie Foreman, MD      . ondansetron [facility]) tablet 4 mg  4 mg Oral Q6H PRN Harrie Foreman, MD       Or  . ondansetron [facility]) injection 4 mg  4 mg Intravenous Q6H PRN Harrie Foreman, MD      . pantoprazole (PROTONIX) EC tablet 40 mg  40 mg Oral Daily Harrie Foreman, MD   40 mg at 03/26/18 0827  . pravastatin (PRAVACHOL) tablet 20 mg  20 mg Oral Daily Harrie Foreman, MD   20 mg at 03/26/18 0825  . ramipril (ALTACE) capsule 5 mg  5 mg Oral Daily Harrie Foreman, MD   5 mg at 03/26/18 0844  . traZODone (DESYREL) tablet 150 mg  150 mg Oral QHS Harrie Foreman, MD   150 mg at 03/25/18 2201  . vancomycin (VANCOCIN) IVPB 1000 mg/200 mL premix  1,000 mg  Intravenous Q18H Mody, Sital, MD 200 mL/hr at 03/26/18 1554 1,000 mg at 03/26/18 1554  . vitamin B-12 (CYANOCOBALAMIN) tablet 1,000 mcg  1,000 mcg Oral Daily Harrie Foreman, MD   1,000 mcg at 03/26/18 0827  . vitamin E capsule 400 Units  400 Units Oral Daily Harrie Foreman, MD   400 Units at 03/26/18 5329     Discharge Medications: Please see discharge summary for a list of discharge medications.  Relevant Imaging Results:  Relevant Lab Results:   Additional Information (SSN: [id number])  Dore Oquin, Veronia Beets, LCSW

## 2018-03-26 NOTE — Evaluation (Signed)
Physical Therapy Evaluation Patient Details Name: [name] MRN: [id number] DOB: Aug 08, 1927 Today's Date: 03/26/2018   History of Present Illness  Pt is a 84 y/o F who presented with swelling and pain at her R medial and lateral malleolus at the site of her previous wounds.  Pt with cellulitis RLE. DVT ruled out with negative venous dopplers.  Pt's PMH includes chronic back pain, CABG.      Clinical Impression  Pt admitted with above diagnosis. Pt currently with functional limitations due to the deficits listed below (see PT Problem List). [name] was very pleasant but with some cognitive impairments (no family present to provide pt's baseline).  She demonstrated her ability to ambulate 60 ft with a RW and supervision for safety.  She does require verbal cues for improved mechanical advantage with sit>stand as pt has difficulty initially.  Pt reports that her roommate "Scrubby" assists her with dressing and "sometimes his hands wonder".  Pt denies this roommate ever hurting her and says, "He would never hurt me".  When asked if her roommate ever touches her when she does not want it she replies, "I tell him not to do that".  RN and SW made aware. Pt also lives with boyfriend who recently broke his arm and recieves assist from roommate as well. Recommendation is for HHPT services at d/c, as long as pt is able to return to a safe home environment.  Pt will benefit from skilled PT to increase their independence and safety with mobility to allow discharge to the venue listed below.      Follow Up Recommendations Home health PT    Equipment Recommendations  None recommended by PT    Recommendations for Other Services       Precautions / Restrictions Precautions Precautions: Fall Restrictions Weight Bearing Restrictions: No      Mobility  Bed Mobility               General bed mobility comments: Pt sitting in chair at start and end of session  Transfers Overall transfer  level: Needs assistance Equipment used: Rolling walker (2 wheeled) Transfers: Sit to/from Stand Sit to Stand: Min guard         General transfer comment: Cues to scoot to edge of seat for mechanical advantage with standing.  Pt is slow to rise.    Ambulation/Gait Ambulation/Gait assistance: Supervision Gait Distance (Feet): 60 Feet Assistive device: Rolling walker (2 wheeled) Gait Pattern/deviations: Step-through pattern;Decreased step length - right;Decreased step length - left     General Gait Details: Pt unable to multi-task while ambulating and stops each time she begins talking.  She remains steady throughout using RW.  Pt even able to release grip on RW in static standing when talking.    Stairs            Wheelchair Mobility    Modified Rankin (Stroke Patients Only)       Balance Overall balance assessment: Needs assistance Sitting-balance support: No upper extremity supported;Feet supported Sitting balance-Leahy Scale: Good     Standing balance support: No upper extremity supported;During functional activity Standing balance-Leahy Scale: Fair Standing balance comment: Pt able to stand statically without UE support but would likely lose her balance with perturbation                             Pertinent Vitals/Pain Pain Assessment: Faces Faces Pain Scale: Hurts even more Pain Location: R lower  leg Pain Descriptors / Indicators: Discomfort;Aching Pain Intervention(s): Limited activity within patient's tolerance;Monitored during session    Home Living Family/patient expects to be discharged to:: Private residence Living Arrangements: Spouse/significant other;Non-relatives/Friends(boyfriend and roommate) Available Help at Discharge: Friend(s);Available 24 hours/day Type of Home: House Home Access: Stairs to enter Entrance Stairs-Rails: Chemical engineer of Steps: 7 Home Layout: One level Home Equipment: Environmental consultant - 2  wheels;Walker - 4 wheels;Wheelchair - manual Additional Comments: Pt reports her home is very cluttered. She is unable to safely get to her back bedroom which is larger.  Instead she uses the smaller bathroom where her WC does not fit through the doorway.  She has two handlebars are the entrance to allow her to pull to standing and then she holds onto the countertop as she walks to the toilet.      Prior Function Level of Independence: Needs assistance   Gait / Transfers Assistance Needed: Pt able to ambulate short household distances wth RW (~30 ft), says she was working on this with HHPT.  Uses WC which she typically has her roommate push in the home but is able to push with her UEs if needed.  It sounds like she uses her WC much more than she does ambulate using RW.  Pt says she is able to navigate 7 steps to enter home by turning sideways and holding onto rail.  Boyfriend and roommate then have [place] ready for the pt at bottom/top of steps.   ADL's / Homemaking Assistance Needed: Pt does the cooking while holding onto the countertop.  Ind with sponge bathing.  Needs assist from roommate for bed mobility.  Roommate assists with dressing (see general comments below).         Hand Dominance        Extremity/Trunk Assessment   Upper Extremity Assessment Upper Extremity Assessment: Generalized weakness    Lower Extremity Assessment Lower Extremity Assessment: Generalized weakness;RLE deficits/detail RLE Deficits / Details: Redness and swelling RLE distal to R knee with bandage at sites of wounds.     Cervical / Trunk Assessment Cervical / Trunk Assessment: Kyphotic  Communication   Communication: HOH  Cognition Arousal/Alertness: Awake/alert Behavior During Therapy: WFL for tasks assessed/performed Overall Cognitive Status: No family/caregiver present to determine baseline cognitive functioning                                 General Comments: Conversation very  tangential.  Pt requires cues to remain on task/topic.  She requires cues with mobility to reminder her of where she is going (i.e. sit in chair).       General Comments General comments (skin integrity, edema, etc.): Pt reports that her roommate "Scrubby" assists her with dressing and "sometimes his hands wonder".  Pt denies this roommate ever hurting her and says, "He would never hurt me".  When asked if her roommate ever touches her when she does not want it she replies, "I tell him not to do that".  RN and SW made aware. Pt also lives with boyfriend who recently broke his arm and recieves assist from roommate as well.     Exercises     Assessment/Plan    PT Assessment Patient needs continued PT services  PT Problem List Decreased strength;Decreased balance;Decreased activity tolerance;Decreased cognition;Decreased knowledge of use of DME;Decreased safety awareness;Pain       PT Treatment Interventions DME instruction;Gait training;Stair training;Functional mobility training;Therapeutic activities;Therapeutic exercise;Balance training;Neuromuscular  re-education;Cognitive remediation;Patient/family education    PT Goals (Current goals can be found in the Care Plan section)  Acute Rehab PT Goals Patient Stated Goal: to feel better and go home PT Goal Formulation: With patient Time For Goal Achievement: 04/09/18 Potential to Achieve Goals: Good    Frequency Min 2X/week   Barriers to discharge Other (comment) Question safety in home for the pt (see general comments above).  SW made aware.     Co-evaluation               AM-PAC PT "6 Clicks" Mobility  Outcome Measure Help needed turning from your back to your side while in a flat bed without using bedrails?: A Glaeser Help needed moving from lying on your back to sitting on the side of a flat bed without using bedrails?: A Pilot Help needed moving to and from a bed to a chair (including a wheelchair)?: A Miyazaki Help needed  standing up from a chair using your arms (e.g., wheelchair or bedside chair)?: A Gaw Help needed to walk in hospital room?: A Gnau Help needed climbing 3-5 steps with a railing? : A Kihn 6 Click Score: 18    End of Session Equipment Utilized During Treatment: Gait belt Activity Tolerance: Patient tolerated treatment well Patient left: in chair;with call bell/phone within reach;with chair alarm set Nurse Communication: Mobility status;Other (comment)(concerns about roommate's conduct with pt) PT Visit Diagnosis: Unsteadiness on feet (R26.81);Muscle weakness (generalized) (M62.81)    Time: 7408-1448 PT Time Calculation (min) (ACUTE ONLY): 33 min   Charges:   PT Evaluation $PT Eval Low Complexity: 1 Low PT Treatments $Gait Training: 8-22 mins       Collie Siad PT, DPT 03/26/2018, 11:35 AM

## 2018-03-26 NOTE — Consult Note (Signed)
Reason for Consult: Leg Ulcers Referring Physician: Dr. Alvira Philips [name] is an 84 y.o. female.  HPI: Patient with DM, CAD, s/p CABG with Right lower extremity vein harvest, chronic back pain, venous insufficiency. She presents with ~6 months of right lower extremity lateral wound after injuring her leg  With a scrap in her garage in the summer. She was treated with local wound care and antibiotics with reported initial healing. At the time she did not want compression therapy as recommended. Apparently the wound reoccurred and developed worsening cellulitis. 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 She denies claudication or rest pain and notes her only limitation was secondary to her chronic back pain.   Past Medical History:  Diagnosis Date  . Acute deep vein thrombosis (DVT) of distal vein of right lower extremity ([place]) 12/2013   Acute thrombus of R proximal gastrocnemius vein. Symptomatic provoked distal DVT  . Anxiety   . Cellulitis of leg, left 01/16/2014  . Chronic back pain 03/2012   lumbar DDD with herniation - s/p L S1 transforaminal ESI (10/2012 [name]. Sharlet [name])  . Chronic venous insufficiency   . Coronary artery disease    CABG 1998  . Cystocele   . Depression   . Diabetes mellitus    type 2, followed by endo.  . Diastolic dysfunction   . Diverticulosis   . Hemorrhoids   . History of heart attack   . Hx of echocardiogram    a. Echo 10/13: mild LVH, EF 55-60%, Gr 1 diast dysfn, PASP 32  . Hyperlipidemia   . Hypertension   . Irritable bowel syndrome   . Microcytic anemia   . PAD (peripheral artery disease) ([place]) 2013   ABI: R 0.91, L 0.83  . PUD (peptic ulcer disease)   . Varicose veins   . Venous ulcer of leg ([place]) 12/12/2013   Required prolonged treatment by wound center  . Viral gastroenteritis due to [place] virus 05/13/2015   hospitalization    Past Surgical History:  Procedure Laterality Date  . ABDOMINAL  HYSTERECTOMY  1975   TAH,BSO  . ABI  2013   R 0.91, L 0.83  . APPENDECTOMY     per pt report  . CARDIOVASCULAR STRESS TEST  2015   low risk myoview ([name])  . CATARACT SURGERY    . CHOLECYSTECTOMY    . COLONOSCOPY  02/2010   extensive diverticulosis throughout, small internal hemorrhoids, rec rpt 5 yrs ([name]. Allyn [name])  . CORONARY ARTERY BYPASS GRAFT  1998  . dexa scan  09/2011   femur -0.8, forearm 0.6 => normal  . ENDOVENOUS ABLATION SAPHENOUS VEIN W/ LASER Left 03/2014   [name]  . ESI  2014   L S1 transforaminal ESI x2 ([name]) without improvement  . HAND SURGERY    . lexiscan myoview  03/2011   Small basal inferolateral and anterolateral reversible perfusion defect suggests ischemia.  EF was normal.   old infarct, no new ischemia  . toe surgery    . TYMPANOPLASTY  1960s   R side  . Vault prolapse A & P repair  2009   [name] [facility]    Family History  Problem Relation Age of Onset  . Hypertension Mother   . Stroke Mother   . Diabetes Father   . Diabetes Sister   . Diabetes Brother   . Heart disease Son   . Colon cancer Neg Hx  Social History:  reports that she quit smoking about 39 years ago. She has never used smokeless tobacco. She reports that she does not drink alcohol or use drugs.  Allergies:  Allergies  Allergen Reactions  . Metformin And Related Diarrhea    Medications: I have reviewed the patient's current medications.  Results for orders placed or performed during the hospital encounter of 03/23/18 (from the past 48 hour(s))  Aerobic/Anaerobic Culture (surgical/deep wound)     Status: None (Preliminary result)   Collection Time: 03/24/18  5:52 PM  Result Value Ref Range   Specimen Description      LEG Performed at [facility], [address] [place], [place] [address]    Special Requests      NONE Performed at [facility], [address] [place], [place] [address]    Gram Stain      RARE WBC PRESENT,  PREDOMINANTLY PMN MODERATE GRAM POSITIVE COCCI Performed at [facility], [address], [place], [place] [address]    Culture      MODERATE METHICILLIN RESISTANT STAPHYLOCOCCUS AUREUS NO ANAEROBES ISOLATED; CULTURE IN PROGRESS FOR 5 DAYS    Report Status PENDING    Organism ID, Bacteria METHICILLIN RESISTANT STAPHYLOCOCCUS AUREUS       Susceptibility   Methicillin resistant staphylococcus aureus - MIC*    CIPROFLOXACIN >=8 RESISTANT Resistant     ERYTHROMYCIN >=8 RESISTANT Resistant     GENTAMICIN <=0.5 SENSITIVE Sensitive     OXACILLIN >=4 RESISTANT Resistant     TETRACYCLINE <=1 SENSITIVE Sensitive     VANCOMYCIN <=0.5 SENSITIVE Sensitive     TRIMETH/SULFA <=10 SENSITIVE Sensitive     CLINDAMYCIN <=0.25 SENSITIVE Sensitive     RIFAMPIN <=0.5 SENSITIVE Sensitive     Inducible Clindamycin NEGATIVE Sensitive     * MODERATE METHICILLIN RESISTANT STAPHYLOCOCCUS AUREUS  Creatinine, serum     Status: None   Collection Time: 03/25/18  4:45 AM  Result Value Ref Range   Creatinine, Ser 0.79 0.44 - 1.00 mg/dL   GFR calc non Af Amer >60 >60 mL/min   GFR calc Af Amer >60 >60 mL/min    Comment: Performed at [facility], [address] [place], [place] [address]  Glucose, capillary     Status: Abnormal   Collection Time: 03/26/18  8:28 AM  Result Value Ref Range   Glucose-Capillary 114 (H) 70 - 99 mg/dL  Glucose, capillary     Status: Abnormal   Collection Time: 03/26/18 11:37 AM  Result Value Ref Range   Glucose-Capillary 175 (H) 70 - 99 mg/dL  Vancomycin, trough     Status: Abnormal   Collection Time: 03/26/18  1:55 PM  Result Value Ref Range   Vancomycin Tr 8 (L) 15 - 20 ug/mL    Comment: Performed at [facility], [address] [place], [place] [address]    No results found.  Review of Systems  Constitutional: Positive for malaise/fatigue. Negative for fever.  HENT: Positive for hearing loss.   Respiratory: Negative.   Cardiovascular: Negative  for chest pain and claudication.  Gastrointestinal: Negative for abdominal pain and vomiting.  Musculoskeletal: Positive for back pain.  Neurological: Negative.   Endo/Heme/Allergies: Negative.   Psychiatric/Behavioral: Negative.   All other systems reviewed and are negative.  Blood pressure (!) 153/61, pulse 75, temperature 98 F (36.7 C), temperature source Oral, resp. rate 18, height 4\' 9"  (1.448 m), weight 66.8 kg, SpO2 94 %. Physical Exam  Nursing note and  vitals reviewed. Constitutional: She is oriented to person, place, and time. She appears well-developed and well-nourished.  Cardiovascular: Normal rate.  Bilateral palpable femoral pulses. RIGHT: Multiphasic DP; +PT, foot warm, well perfused, +motor/sensory Left: multiphasic DP  Musculoskeletal:     Comments: RIGHT: lower extremity- erythema, cellulitis, evidence of venous stasis/insufficiency, +Lateral ulcer with sloughing. Medial ulcer- dry. Prominent varicosities, tender.  Neurological: She is alert and oriented to person, place, and time.  Skin: Skin is warm. There is erythema.    Assessment/Plan: Venous stasis/insufficiency and Venous ulcer. RIGHT Lower extremity. The patient likely has mild arterial disease however, she has a multiphasic DP, without claudication or rest pain. Based upon the exam and her history she has primarily venous stasis/insufficiency and ulcerations.  It does not appear that she has had wound care with adequate compression therapy.  Recommend IV ABX, Wound Care consult and compression therapy.  Upon discharge she should follow up in Vascular Surgery office for Venous insufficieny and additional arterial studies.  No intervention recommended at this time.   Please call/reconsult for further questions/concerns.  ,  A 03/26/2018, 3:01 PM

## 2018-03-26 NOTE — Progress Notes (Signed)
[facility]  Patient Status: Active  [facility] is providing the following services: SN, PT  If patient discharges after hours, please call [phone].   [name] 03/26/2018, 10:09 AM
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
## 2018-03-26 NOTE — Care Management Important Message (Signed)
Important Message  Patient Details  Name: [name] MRN: [id number] Date of Birth: May 09, 1927   Medicare Important Message Given:  Yes    Juliann Pulse A [name] 03/26/2018, 11:35 AM

## 2018-03-26 NOTE — Progress Notes (Signed)
[place] [facility] [place] NAME: [name]    MR#:  [id number]  DATE OF BIRTH:  1927/05/23  SUBJECTIVE:   Still having pain  REVIEW OF SYSTEMS:    Review of Systems  Constitutional: Negative for fever, chills weight loss HENT: Negative for ear pain, nosebleeds, congestion, facial swelling, rhinorrhea, neck pain, neck stiffness and ear discharge.   Respiratory: Negative for cough, shortness of breath, wheezing  Cardiovascular: Negative for chest pain, palpitations and leg swelling.  Gastrointestinal: Negative for heartburn, abdominal pain, vomiting, diarrhea or consitpation Genitourinary: Negative for dysuria, urgency, frequency, hematuria Musculoskeletal: Negative for back pain or joint pain Pain at the infection site Neurological: Negative for dizziness, seizures, syncope, focal weakness,  numbness and headaches.  Hematological: Does not bruise/bleed easily.  Psychiatric/Behavioral: Negative for hallucinations, confusion, dysphoric mood    Tolerating Diet: yes      DRUG ALLERGIES:   Allergies  Allergen Reactions  . Metformin And Related Diarrhea    VITALS:  Blood pressure (!) 153/61, pulse 75, temperature 98 F (36.7 C), temperature source Oral, resp. rate 18, height 4\' 9"  (1.448 m), weight 66.8 kg, SpO2 94 %.  PHYSICAL EXAMINATION:  Constitutional: Appears well-developed and well-nourished. No distress. HENT: Normocephalic. Marland Kitchen Oropharynx is clear and moist.  Eyes: Conjunctivae and EOM are normal. PERRLA, no scleral icterus.  Neck: Normal ROM. Neck supple. No JVD. No tracheal deviation. CVS: RRR, S1/S2 +, no murmurs, no gallops, no carotid bruit.  Pulmonary: Effort and breath sounds normal, no stridor, rhonchi, wheezes, rales.  Abdominal: Soft. BS +,  no distension, tenderness, rebound or guarding.  Musculoskeletal: Normal range of motion. No edema and no tenderness.  Neuro: Alert. CN 2-12 grossly intact. No focal  deficits. Skin: Right leg lateral with improved erythema however foul-smelling and painful to touch   psychiatric: Normal mood and affect.      LABORATORY PANEL:   CBC Recent Labs  Lab 03/23/18 1452  WBC 10.6*  HGB 11.5*  HCT 37.1  PLT 318   ------------------------------------------------------------------------------------------------------------------  Chemistries  Recent Labs  Lab 03/23/18 1452 03/25/18 0445  NA 137  --   K 4.7  --   CL 104  --   CO2 25  --   GLUCOSE 75  --   BUN 21  --   CREATININE 0.82 0.79  CALCIUM 8.9  --    ------------------------------------------------------------------------------------------------------------------  Cardiac Enzymes No results for input(s): TROPONINI in the last 168 hours. ------------------------------------------------------------------------------------------------------------------  RADIOLOGY:  No results found.   ASSESSMENT AND PLAN:   84 year old female with a history of CAD/CABG, hypertension and peripheral arterial disease who presents with right lower extremity cellulitis.  1.  Cellulitis/right lower extremity wound MRSA per wound cultures: Continue vancomycin with plan to change to DOXY if MRI negative for osteo Order MRI to rule out osteomyelitis I will consult vascular surgery given history of PAD.consult placed dr dew to see   2.  CAD: Continue metoprolol, statin, Altace, aspirin 3.  Diabetes: Continue sliding scale and ADA diet  4.  Peripheral vascular disease: Continue aspirin and statin  5.  Anxiety: Continue Xanax as needed  Management plans discussed with the patient and she is in agreement.  CODE STATUS: full  TOTAL TIME TAKING CARE OF THIS PATIENT: 24 minutes.     POSSIBLE D/C tomorrow, DEPENDING ON CLINICAL CONDITION.   Evangela Heffler M.D on 03/26/2018 at 11:16 AM  Between 7am to 6pm - Pager - [phone] After 6pm go to [email or web address] -  password [email or web address]  [facility]  Office  [phone]  CC: Primary care physician; Ria Bush, MD  Note: This dictation was prepared with Dragon dictation along with smaller phrase technology. Any transcriptional errors that result from this process are unintentional.

## 2018-03-27 ENCOUNTER — Inpatient Hospital Stay: Payer: Medicare Other

## 2018-03-27 ENCOUNTER — Telehealth: Payer: Self-pay

## 2018-03-27 LAB — GLUCOSE, CAPILLARY: Glucose-Capillary: 124 mg/dL — ABNORMAL HIGH (ref 70–99)

## 2018-03-27 MED ORDER — COLLAGENASE 250 UNIT/GM EX OINT: TOPICAL_OINTMENT | Freq: Every day | CUTANEOUS | 0 refills | Status: DC

## 2018-03-27 MED ORDER — DOXYCYCLINE HYCLATE 100 MG PO TABS
100.0000 mg | ORAL_TABLET | Freq: Two times a day (BID) | ORAL | Status: DC
  Administered 2018-03-27: 100 mg via ORAL
  Filled 2018-03-27: qty 1

## 2018-03-27 MED ORDER — COLLAGENASE 250 UNIT/GM EX OINT
TOPICAL_OINTMENT | Freq: Every day | CUTANEOUS | Status: DC
  Administered 2018-03-27: 10:00:00 via TOPICAL
  Filled 2018-03-27: qty 30

## 2018-03-27 MED ORDER — GADOBUTROL 1 MMOL/ML IV SOLN
6.0000 mL | Freq: Once | INTRAVENOUS | Status: AC | PRN
  Administered 2018-03-27: 6 mL via INTRAVENOUS

## 2018-03-27 MED ORDER — DOXYCYCLINE HYCLATE 100 MG PO TABS: 100.0000 mg | ORAL_TABLET | Freq: Two times a day (BID) | ORAL | 0 refills | Status: AC

## 2018-03-27 NOTE — Progress Notes (Signed)
Pt ready for discharge home today per MD. Patient assessment unchanged from this morning. Reviewed discharge instructions and prescriptions with pt and her daughter; all questions answered and pt verbalized understanding. PIV removed, VSS. Pt assisted to car via NT.  Ethelda Chick

## 2018-03-27 NOTE — Discharge Summary (Signed)
[facility] NAME: [name]    MR#:  [id number]  DATE OF BIRTH:  03-May-1927  DATE OF ADMISSION:  03/23/2018 ADMITTING PHYSICIAN: Harrie Foreman, MD  DATE OF DISCHARGE: 03/27/2018  PRIMARY CARE PHYSICIAN: Ria Bush, MD    ADMISSION DIAGNOSIS:  Cellulitis of right lower extremity [L03.115] Type 2 diabetes mellitus with other skin ulcer, without long-term current use of insulin ([place]) [E11.622]  DISCHARGE DIAGNOSIS:  Active Problems:   Cellulitis   SECONDARY DIAGNOSIS:   Past Medical History:  Diagnosis Date  . Acute deep vein thrombosis (DVT) of distal vein of right lower extremity ([place]) 12/2013   Acute thrombus of R proximal gastrocnemius vein. Symptomatic provoked distal DVT  . Anxiety   . Cellulitis of leg, left 01/16/2014  . Chronic back pain 03/2012   lumbar DDD with herniation - s/p L S1 transforaminal ESI (10/2012 Dr. Sharlet Salina)  . Chronic venous insufficiency   . Coronary artery disease    CABG 1998  . Cystocele   . Depression   . Diabetes mellitus    type 2, followed by endo.  . Diastolic dysfunction   . Diverticulosis   . Hemorrhoids   . History of heart attack   . Hx of echocardiogram    a. Echo 10/13: mild LVH, EF 55-60%, Gr 1 diast dysfn, PASP 32  . Hyperlipidemia   . Hypertension   . Irritable bowel syndrome   . Microcytic anemia   . PAD (peripheral artery disease) ([place]) 2013   ABI: R 0.91, L 0.83  . PUD (peptic ulcer disease)   . Varicose veins   . Venous ulcer of leg ([place]) 12/12/2013   Required prolonged treatment by wound center  . Viral gastroenteritis due to [place] virus 05/13/2015   hospitalization    HOSPITAL COURSE:  84 year old female with a history of CAD/CABG, hypertension and peripheral arterial disease who presents with right lower extremity cellulitis.  1.  Cellulitis/right lower extremity wound MRSA per wound cultures: She was on broad-spectrum antibiotics.  Culture  showing MRSA.  She is transition from vancomycin to doxycycline.  She had MRI which was negative for osteomyelitis.  She was evaluated by vascular surgery.  She may have mild to moderate PAD.  She will follow-up with vascular surgery.  She will continue wound care and she will have outpatient follow-up with the wound care clinician.     Apply Santyl to the RLE wound in a nickel thick layer. Cover with a saline moistened gauze, then dry gauze or ABD pad. Wrap in kerlex. Change daily.   2.  CAD: Continue metoprolol, statin, Altace, aspirin 3.  Diabetes: ContinueADA diet outpatient medications 4.  Peripheral vascular disease: Continue aspirin and statin  5.  Anxiety: Continue Xanax as needed   DISCHARGE CONDITIONS AND DIET:  Stable Diabetic diet  CONSULTS OBTAINED:    DRUG ALLERGIES:   Allergies  Allergen Reactions  . Metformin And Related Diarrhea    DISCHARGE MEDICATIONS:   Allergies as of 03/27/2018      Reactions   Metformin And Related Diarrhea      Medication List    STOP taking these medications   cephALEXin 500 MG capsule Commonly known as:  KEFLEX   predniSONE 20 MG tablet Commonly known as:  DELTASONE     TAKE these medications   acetaminophen 500 MG tablet Commonly known as:  TYLENOL Take 500 mg by mouth every 6 (six) hours as needed for mild pain.  ALIGN 4 MG Caps Take 1 capsule (4 mg total) by mouth daily.   ALPRAZolam 1 MG tablet Commonly known as:  XANAX Take 0.5 mg by mouth 4 (four) times daily as needed for anxiety.   aspirin EC 81 MG tablet Take 81 mg by mouth daily.   collagenase ointment Commonly known as:  SANTYL Apply topically daily.   DEXILANT 30 MG capsule Generic drug:  Dexlansoprazole TAKE 1 CAPSULE (30 MG TOTAL) BY MOUTH DAILY.   diclofenac sodium 1 % Gel Commonly known as:  VOLTAREN Apply 1 application topically 3 (three) times daily.   doxycycline 100 MG tablet Commonly known as:  VIBRA-TABS Take 1 tablet (100 mg  total) by mouth every 12 (twelve) hours for 7 days.   famotidine 20 MG tablet Commonly known as:  PEPCID Take 1 tablet (20 mg total) by mouth at bedtime.   furosemide 20 MG tablet Commonly known as:  LASIX TAKE 0.5 TABLETS (10 MG TOTAL) BY MOUTH DAILY.   glucosamine-chondroitin 500-400 MG tablet Take 1 tablet by mouth daily.   glucose blood test strip Commonly known as:  ONE TOUCH ULTRA TEST USE AS INSTRUCTED TO CHECK BLOOD SUGAR ONCE A DAY Dx code: E11.9   ONETOUCH VERIO test strip Generic drug:  glucose blood USE TO TEST BLOOD SUGAR ONCE DAILY   HYDROcodone-acetaminophen 5-325 MG tablet Commonly known as:  NORCO/VICODIN Take 1 tablet by mouth 3 (three) times daily as needed for moderate pain.   loperamide 2 MG capsule Commonly known as:  IMODIUM Take 2 mg by mouth 4 (four) times daily as needed for diarrhea or loose stools.   loratadine 10 MG tablet Commonly known as:  CLARITIN Take 10 mg by mouth daily as needed for allergies.   methocarbamol 500 MG tablet Commonly known as:  ROBAXIN Take 0.5-1 tablets (250-500 mg total) by mouth 3 (three) times daily as needed for muscle spasms.   metoprolol tartrate 25 MG tablet Commonly known as:  LOPRESSOR TAKE ONE AND HALF TABLETS (37.5 MG TOTAL) BY MOUTH 2 (TWO) TIMES DAILY.   multivitamin tablet Take 1 tablet by mouth daily. Patient reports takes a new vitamin to replace her centrum as recommended by her eye doctor.   nitroGLYCERIN 0.4 MG SL tablet Commonly known as:  NITROSTAT Place 1 tablet (0.4 mg total) under the tongue every 5 (five) minutes as needed for chest pain.   nystatin-triamcinolone ointment Commonly known as:  MYCOLOG APPLY 1 APPLICATION TOPICALLY 2 (TWO) TIMES DAILY. AS NEEDED FOR ITCHING   pioglitazone 30 MG tablet Commonly known as:  ACTOS TAKE 1 TABLET BY MOUTH EVERY DAY   pravastatin 20 MG tablet Commonly known as:  PRAVACHOL TAKE 1 TABLET BY MOUTH EVERY DAY   ramipril 5 MG capsule Commonly  known as:  ALTACE TAKE ONE CAPSULE BY MOUTH EVERYDAY FOR BLOOD PRESSURE   sitaGLIPtin 100 MG tablet Commonly known as:  JANUVIA Take 1 tablet (100 mg total) by mouth daily.   traZODone 150 MG tablet Commonly known as:  DESYREL Take 150 mg by mouth at bedtime.   triamcinolone cream 0.1 % Commonly known as:  KENALOG APPLY 1 APPLICATION TOPICALLY 2 (TWO) TIMES DAILY.   vitamin B-12 1000 MCG tablet Commonly known as:  CYANOCOBALAMIN Take 1,000 mcg by mouth daily.   vitamin E 400 UNIT capsule Take 400 Units by mouth daily.            Discharge Care Instructions  (From admission, onward)  Start     Ordered   03/27/18 0000  Discharge wound care:    Comments:  : Apply Santyl to the RLE wound in a nickel thick layer. Cover with a saline moistened gauze, then dry gauze or ABD pad. Wrap in kerlex. Change daily.   03/27/18 0933            Today   CHIEF COMPLAINT:  Patient doing well this am   VITAL SIGNS:  Blood pressure (!) 164/62, pulse 68, temperature 98 F (36.7 C), temperature source Oral, resp. rate 18, height 4\' 9"  (1.448 m), weight 66.8 kg, SpO2 94 %.   REVIEW OF SYSTEMS:  Review of Systems  Constitutional: Negative.  Negative for chills, fever and malaise/fatigue.  HENT: Negative.  Negative for ear discharge, ear pain, hearing loss, nosebleeds and sore throat.   Eyes: Negative.  Negative for blurred vision and pain.  Respiratory: Negative.  Negative for cough, hemoptysis, shortness of breath and wheezing.   Cardiovascular: Negative.  Negative for chest pain, palpitations and leg swelling.  Gastrointestinal: Negative.  Negative for abdominal pain, blood in stool, diarrhea, nausea and vomiting.  Genitourinary: Negative.  Negative for dysuria.  Musculoskeletal: Negative.  Negative for back pain.  Skin:       improving  Neurological: Negative for dizziness, tremors, speech change, focal weakness, seizures and headaches.  Endo/Heme/Allergies:  Negative.  Does not bruise/bleed easily.  Psychiatric/Behavioral: Negative.  Negative for depression, hallucinations and suicidal ideas.     PHYSICAL EXAMINATION:  GENERAL:  84 y.o.-year-old patient lying in the bed with no acute distress.  NECK:  Supple, no jugular venous distention. No thyroid enlargement, no tenderness.  LUNGS: Normal breath sounds bilaterally, no wheezing, rales,rhonchi  No use of accessory muscles of respiration.  CARDIOVASCULAR: S1, S2 normal. No murmurs, rubs, or gallops.  ABDOMEN: Soft, non-tender, non-distended. Bowel sounds present. No organomegaly or mass.  EXTREMITIES: No pedal edema, cyanosis, or clubbing.  PSYCHIATRIC: The patient is alert and oriented x 3.  SKIN: RIGHT: lower extremity- erythema, cellulitis, evidence of venous stasis/insufficiency, +Lateral ulcer with sloughing. Medial ulcer- dry. Prominent varicosities, tender  DATA REVIEW:   CBC Recent Labs  Lab 03/23/18 1452  WBC 10.6*  HGB 11.5*  HCT 37.1  PLT 318    Chemistries  Recent Labs  Lab 03/23/18 1452 03/25/18 0445  NA 137  --   K 4.7  --   CL 104  --   CO2 25  --   GLUCOSE 75  --   BUN 21  --   CREATININE 0.82 0.79  CALCIUM 8.9  --     Cardiac Enzymes No results for input(s): TROPONINI in the last 168 hours.  Microbiology Results  @[email or web address] @  RADIOLOGY:  Mr Tibia Fibula Right W Wo Contrast  Result Date: 03/27/2018 CLINICAL DATA:  Nonhealing wounds on right lower leg with redness, pain and swelling. EXAM: MRI OF LOWER RIGHT EXTREMITY WITHOUT AND WITH CONTRAST TECHNIQUE: Multiplanar, multisequence MR imaging of the right lower leg was performed both before and after administration of intravenous contrast. CONTRAST:  6 cc Gadavist IV. COMPARISON:  Plain films right lower leg 03/23/2018. FINDINGS: Bones/Joint/Cartilage There is no bone marrow edema or enhancement to suggest osteomyelitis. No fracture, stress change or focal lesion is identified. Osteoarthritis about both  knees is noted. The left lower leg is imaged in the coronal plane only. Ligaments Negative. Muscles and Tendons No intramuscular fluid collection, edema or enhancement is identified. Musculature is intact without tear or strain. Diffuse, moderate  atrophy musculature is likely related to disuse. Soft tissues Subcutaneous edema and enhancement are present consistent with cellulitis. No abscess is identified. IMPRESSION: Findings compatible with cellulitis of the right lower leg without abscess, myositis or osteomyelitis. Osteoarthritis of both knees. Electronically Signed   By: Inge Rise M.D.   On: 03/27/2018 07:13      Allergies as of 03/27/2018      Reactions   Metformin And Related Diarrhea      Medication List    STOP taking these medications   cephALEXin 500 MG capsule Commonly known as:  KEFLEX   predniSONE 20 MG tablet Commonly known as:  DELTASONE     TAKE these medications   acetaminophen 500 MG tablet Commonly known as:  TYLENOL Take 500 mg by mouth every 6 (six) hours as needed for mild pain.   ALIGN 4 MG Caps Take 1 capsule (4 mg total) by mouth daily.   ALPRAZolam 1 MG tablet Commonly known as:  XANAX Take 0.5 mg by mouth 4 (four) times daily as needed for anxiety.   aspirin EC 81 MG tablet Take 81 mg by mouth daily.   collagenase ointment Commonly known as:  SANTYL Apply topically daily.   DEXILANT 30 MG capsule Generic drug:  Dexlansoprazole TAKE 1 CAPSULE (30 MG TOTAL) BY MOUTH DAILY.   diclofenac sodium 1 % Gel Commonly known as:  VOLTAREN Apply 1 application topically 3 (three) times daily.   doxycycline 100 MG tablet Commonly known as:  VIBRA-TABS Take 1 tablet (100 mg total) by mouth every 12 (twelve) hours for 7 days.   famotidine 20 MG tablet Commonly known as:  PEPCID Take 1 tablet (20 mg total) by mouth at bedtime.   furosemide 20 MG tablet Commonly known as:  LASIX TAKE 0.5 TABLETS (10 MG TOTAL) BY MOUTH DAILY.    glucosamine-chondroitin 500-400 MG tablet Take 1 tablet by mouth daily.   glucose blood test strip Commonly known as:  ONE TOUCH ULTRA TEST USE AS INSTRUCTED TO CHECK BLOOD SUGAR ONCE A DAY Dx code: E11.9   ONETOUCH VERIO test strip Generic drug:  glucose blood USE TO TEST BLOOD SUGAR ONCE DAILY   HYDROcodone-acetaminophen 5-325 MG tablet Commonly known as:  NORCO/VICODIN Take 1 tablet by mouth 3 (three) times daily as needed for moderate pain.   loperamide 2 MG capsule Commonly known as:  IMODIUM Take 2 mg by mouth 4 (four) times daily as needed for diarrhea or loose stools.   loratadine 10 MG tablet Commonly known as:  CLARITIN Take 10 mg by mouth daily as needed for allergies.   methocarbamol 500 MG tablet Commonly known as:  ROBAXIN Take 0.5-1 tablets (250-500 mg total) by mouth 3 (three) times daily as needed for muscle spasms.   metoprolol tartrate 25 MG tablet Commonly known as:  LOPRESSOR TAKE ONE AND HALF TABLETS (37.5 MG TOTAL) BY MOUTH 2 (TWO) TIMES DAILY.   multivitamin tablet Take 1 tablet by mouth daily. Patient reports takes a new vitamin to replace her centrum as recommended by her eye doctor.   nitroGLYCERIN 0.4 MG SL tablet Commonly known as:  NITROSTAT Place 1 tablet (0.4 mg total) under the tongue every 5 (five) minutes as needed for chest pain.   nystatin-triamcinolone ointment Commonly known as:  MYCOLOG APPLY 1 APPLICATION TOPICALLY 2 (TWO) TIMES DAILY. AS NEEDED FOR ITCHING   pioglitazone 30 MG tablet Commonly known as:  ACTOS TAKE 1 TABLET BY MOUTH EVERY DAY   pravastatin 20 MG tablet  Commonly known as:  PRAVACHOL TAKE 1 TABLET BY MOUTH EVERY DAY   ramipril 5 MG capsule Commonly known as:  ALTACE TAKE ONE CAPSULE BY MOUTH EVERYDAY FOR BLOOD PRESSURE   sitaGLIPtin 100 MG tablet Commonly known as:  JANUVIA Take 1 tablet (100 mg total) by mouth daily.   traZODone 150 MG tablet Commonly known as:  DESYREL Take 150 mg by mouth at  bedtime.   triamcinolone cream 0.1 % Commonly known as:  KENALOG APPLY 1 APPLICATION TOPICALLY 2 (TWO) TIMES DAILY.   vitamin B-12 1000 MCG tablet Commonly known as:  CYANOCOBALAMIN Take 1,000 mcg by mouth daily.   vitamin E 400 UNIT capsule Take 400 Units by mouth daily.            Discharge Care Instructions  (From admission, onward)         Start     Ordered   03/27/18 0000  Discharge wound care:    Comments:  : Apply Santyl to the RLE wound in a nickel thick layer. Cover with a saline moistened gauze, then dry gauze or ABD pad. Wrap in kerlex. Change daily.   03/27/18 0933              Management plans discussed with the patient and she is in agreement. Stable for discharge   Patient should follow up with pcp  CODE STATUS:     Code Status Orders  (From admission, onward)         Start     Ordered   03/23/18 1856  Full code  Continuous     03/23/18 1855        Code Status History    Date Active Date Inactive Code Status Order ID Comments User Context   09/27/2015 2014 10/05/2015 1840 Full Code [id number]  Hosie Poisson, MD Inpatient   05/13/2015 1619 05/15/2015 1509 Full Code [id number]  Henreitta Leber, MD Inpatient    Advance Directive Documentation     Most Recent Value  Type of Advance Directive  Healthcare Power of [place], Living will  Pre-existing out of facility DNR order (yellow form or pink MOST form)  -  "MOST" Form in Place?  -      TOTAL TIME TAKING CARE OF THIS PATIENT: 38 minutes.    Note: This dictation was prepared with Dragon dictation along with smaller phrase technology. Any transcriptional errors that result from this process are unintentional.  Maheen Cwikla M.D on 03/27/2018 at 9:34 AM  Between 7am to 6pm - Pager - [phone] After 6pm go to [email or web address] - password [email or web address] [facility]  Office  [phone]  CC: Primary care physician; Ria Bush, MD

## 2018-03-27 NOTE — Plan of Care (Signed)

## 2018-03-27 NOTE — Consult Note (Signed)
[facility] Nurse wound consult note Patient receiving care in [facility].  No family present. Reason for Consult: RLE wound Wound type: Chronic, non-healing, old trauma wound. Measurement: 3.2 cm x 2.5 cm x unmeasurable depth due to thick slough in the wound. Wound bed: 100% thick yellow slough Drainage (amount, consistency, odor) Heavy amounts on existing foam dressing. Periwound: Erythematous, tender to the lightest touch.  Dressing procedure/placement/frequency: Apply Santyl to the RLE wound in a nickel thick layer. Cover with a saline moistened gauze, then dry gauze or ABD pad. Wrap in kerlex. Change daily. The patient is adamant that she will not try Unna boot compression wrap, or any type of compression wrap.  She states she has tried it twice and could not tolerate the boot. Monitor the wound area(s) for worsening of condition such as: Signs/symptoms of infection,  Increase in size,  Development of or worsening of odor, Development of pain, or increased pain at the affected locations.  Notify the medical team if any of these develop.  Thank you for the consult.  Discussed plan of care with the patient and bedside nurse.  [place] nurse will not follow at this time.  Please re-consult the [facility] team if needed.  Val Riles, RN, MSN, CWOCN, CNS-BC, pager [phone]

## 2018-03-27 NOTE — Plan of Care (Signed)
  Problem: Education: Goal: Knowledge of General Education information will improve Description Including pain rating scale, medication(s)/side effects and non-pharmacologic comfort measures 03/27/2018 0655 by Vergie Living, RN Outcome: Progressing 03/27/2018 0654 by Jannifer Rodney A, RN Outcome: Progressing   Problem: Health Behavior/Discharge Planning: Goal: Ability to manage health-related needs will improve 03/27/2018 0655 by Jannifer Rodney A, RN Outcome: Progressing 03/27/2018 0654 by Jannifer Rodney A, RN Outcome: Progressing   Problem: Clinical Measurements: Goal: Ability to maintain clinical measurements within normal limits will improve 03/27/2018 0655 by Jannifer Rodney A, RN Outcome: Progressing 03/27/2018 0654 by Jannifer Rodney A, RN Outcome: Progressing Goal: Will remain free from infection 03/27/2018 0655 by Jannifer Rodney A, RN Outcome: Progressing 03/27/2018 0654 by Jannifer Rodney A, RN Outcome: Progressing Goal: Diagnostic test results will improve 03/27/2018 0655 by Vergie Living, RN Outcome: Progressing 03/27/2018 0654 by Jannifer Rodney A, RN Outcome: Progressing Goal: Respiratory complications will improve 03/27/2018 0655 by Vergie Living, RN Outcome: Progressing 03/27/2018 0654 by Jannifer Rodney A, RN Outcome: Progressing Goal: Cardiovascular complication will be avoided 03/27/2018 0655 by Jannifer Rodney A, RN Outcome: Progressing 03/27/2018 0654 by Jannifer Rodney A, RN Outcome: Progressing   Problem: Activity: Goal: Risk for activity intolerance will decrease 03/27/2018 0655 by Vergie Living, RN Outcome: Progressing 03/27/2018 0654 by Jannifer Rodney A, RN Outcome: Progressing   Problem: Nutrition: Goal: Adequate nutrition will be maintained 03/27/2018 0655 by Vergie Living, RN Outcome: Progressing 03/27/2018 0654 by Jannifer Rodney A, RN Outcome: Progressing   Problem: Coping: Goal: Level of anxiety will decrease 03/27/2018  0655 by Jannifer Rodney A, RN Outcome: Progressing 03/27/2018 0654 by Jannifer Rodney A, RN Outcome: Progressing   Problem: Elimination: Goal: Will not experience complications related to bowel motility 03/27/2018 0655 by Vergie Living, RN Outcome: Progressing 03/27/2018 0654 by Jannifer Rodney A, RN Outcome: Progressing Goal: Will not experience complications related to urinary retention 03/27/2018 0655 by Vergie Living, RN Outcome: Progressing 03/27/2018 0654 by Jannifer Rodney A, RN Outcome: Progressing   Problem: Pain Managment: Goal: General experience of comfort will improve 03/27/2018 0655 by Vergie Living, RN Outcome: Progressing 03/27/2018 0654 by Jannifer Rodney A, RN Outcome: Progressing   Problem: Safety: Goal: Ability to remain free from injury will improve 03/27/2018 0655 by Jannifer Rodney A, RN Outcome: Progressing 03/27/2018 0654 by Jannifer Rodney A, RN Outcome: Progressing   Problem: Skin Integrity: Goal: Risk for impaired skin integrity will decrease 03/27/2018 0655 by Vergie Living, RN Outcome: Progressing 03/27/2018 0654 by Vergie Living, RN Outcome: Progressing

## 2018-03-27 NOTE — Progress Notes (Signed)
Pharmacy Antibiotic Note  [name] is a 84 y.o. female admitted on 03/23/2018 with sepsis.  Pharmacy has been consulted for vancomycin and cefepime dosing. Patient is also on metronidazole.  Plan: Patient changed from IV vancomycin to PO doxycycline per Primary MD Dr. Benjie Karvonen.  Will order doxycycline 100 mg every 12 hours.  Height: 4\' 9"  (144.8 cm) Weight: 147 lb 4.3 oz (66.8 kg) IBW/kg (Calculated) : 38.6  Temp (24hrs), Avg:98.1 F (36.7 C), Min:97.9 F (36.6 C), Max:98.3 F (36.8 C)  Recent Labs  Lab 03/23/18 1452 03/25/18 0445 03/26/18 1355  WBC 10.6*  --   --   CREATININE 0.82 0.79  --   VANCOTROUGH  --   --  8*    Estimated Creatinine Clearance: 38.3 mL/min (by C-G formula based on SCr of 0.79 mg/dL).    Allergies  Allergen Reactions  . Metformin And Related Diarrhea    Antimicrobials this admission: Vancomycin 12/20 >> 12/24 Cefepime 12/20 >> 12/23 Metronidazole 12/20 >>12/23  Dose adjustments this admission: NA  Microbiology results: 12/21 Wound culture: MRSA 12/24 MRI: no findings of osteomyelitis  Thank you for allowing pharmacy to be a part of this patient's care.  Forrest Moron, PharmD Clinical Pharmacist 03/27/2018 7:55 AM

## 2018-03-27 NOTE — Telephone Encounter (Signed)
Received faxed New Rx request from [facility] for [place] stating it is too exprensive.

## 2018-03-27 NOTE — Care Management Note (Signed)
Case Management Note  Patient Details  Name: [name] MRN: [id number] Date of Birth: 24-Nov-1927  Subjective/Objective:                 Patient for discharge home today with resumption of home health orders   Action/Plan:  Notified [facility] of discharge  Expected Discharge Date:  03/27/18               Expected Discharge Plan:     In-House Referral:     Discharge planning Services  CM Consult  Post Acute Care Choice:  Resumption of Svcs/PTA Provider(wishes to remain with [facility]) Choice offered to:  NA  DME Arranged:    DME Agency:     HH Arranged:  RN, PT [place] Agency:  [place]  Status of Service:  Completed, signed off  If discussed at [place] of Stay Meetings, dates discussed:    Additional Comments:  Katrina Stack, RN 03/27/2018, 9:44 AM

## 2018-03-29 ENCOUNTER — Telehealth: Payer: Self-pay | Admitting: *Deleted

## 2018-03-29 ENCOUNTER — Encounter: Payer: Self-pay | Admitting: Family Medicine

## 2018-03-29 DIAGNOSIS — L97812 Non-pressure chronic ulcer of other part of right lower leg with fat layer exposed: Secondary | ICD-10-CM | POA: Diagnosis not present

## 2018-03-29 DIAGNOSIS — I872 Venous insufficiency (chronic) (peripheral): Secondary | ICD-10-CM | POA: Diagnosis not present

## 2018-03-29 DIAGNOSIS — E1151 Type 2 diabetes mellitus with diabetic peripheral angiopathy without gangrene: Secondary | ICD-10-CM | POA: Diagnosis not present

## 2018-03-29 DIAGNOSIS — I2510 Atherosclerotic heart disease of native coronary artery without angina pectoris: Secondary | ICD-10-CM | POA: Diagnosis not present

## 2018-03-29 DIAGNOSIS — E11622 Type 2 diabetes mellitus with other skin ulcer: Secondary | ICD-10-CM | POA: Diagnosis not present

## 2018-03-29 DIAGNOSIS — L97211 Non-pressure chronic ulcer of right calf limited to breakdown of skin: Secondary | ICD-10-CM | POA: Diagnosis not present

## 2018-03-29 MED ORDER — PANTOPRAZOLE SODIUM 40 MG PO TBEC: 40.0000 mg | DELAYED_RELEASE_TABLET | Freq: Every day | ORAL | 3 refills | Status: DC

## 2018-03-29 NOTE — Telephone Encounter (Signed)
Spoke with pt asking if she has tried any meds like Protonix.  States not that she knows of.

## 2018-03-29 NOTE — Progress Notes (Signed)
BP 118/68 (BP Location: Left Arm, Patient Position: Sitting, Cuff Size: Normal)   Pulse 87   Temp 97.6 F (36.4 C) (Oral)   Ht 4\' 9"  (1.448 m)   Wt 142 lb (64.4 kg)   SpO2 97%   BMI 30.73 kg/m    CC: hosp f/u visit Subjective:    [name], female    DOB: July 12, 1927, 84 y.o.   MRN: [id number]  HPI: [name] is a 84 y.o. female presenting on 03/30/2018 for Hospitalization Follow-up    Recent hospitalization for progressive RLE MRSA cellulitis by wound culture. Initially treated with broad specturm IV abx, transitioned from vancomycin to doxycycline. She had MRI with didn't show osteomyelitis. She was seen by vascular surgery without recommended surgical intervention. Planned discharge to wound clinic.   Establishing with advanced home care.  Since home, continues dressing change daily.  Limited mobility, back pain seems to be getting better.  DATE OF ADMISSION:  03/23/2018   DATE OF DISCHARGE: 03/27/2018 TCM hosp f/u phone call not performed, but pt seen within 2 business days of discharge  Discharge diagnosis:   Cellulitis  Discharge wound care:    Comments:  : Apply Santyl to the RLE wound in a nickel thick layer. Cover with a saline moistened gauze, then dry gauze or ABD pad. Wrap in kerlex. Change daily.       Relevant past medical, surgical, family and social history reviewed and updated as indicated. Interim medical history since our last visit reviewed. Allergies and medications reviewed and updated. Discharge medications reconciled with current home medications Outpatient Medications Prior to Visit  Medication Sig Dispense Refill  . acetaminophen (TYLENOL) 500 MG tablet Take 500 mg by mouth every 6 (six) hours as needed for mild pain.    Marland Kitchen ALPRAZolam (XANAX) 1 MG tablet Take 0.5 mg by mouth 4 (four) times daily as needed for anxiety.     Marland Kitchen aspirin EC 81 MG tablet Take 81 mg by mouth daily.    . collagenase (SANTYL) ointment Apply  topically daily. 15 g 0  . DEXILANT 30 MG capsule TAKE 1 CAPSULE (30 MG TOTAL) BY MOUTH DAILY. 30 capsule 11  . diclofenac sodium (VOLTAREN) 1 % GEL Apply 1 application topically 3 (three) times daily. 1 Tube 1  . doxycycline (VIBRA-TABS) 100 MG tablet Take 1 tablet (100 mg total) by mouth every 12 (twelve) hours for 7 days. 14 tablet 0  . famotidine (PEPCID) 20 MG tablet Take 1 tablet (20 mg total) by mouth at bedtime.    . furosemide (LASIX) 20 MG tablet TAKE 0.5 TABLETS (10 MG TOTAL) BY MOUTH DAILY. 45 tablet 3  . glucosamine-chondroitin 500-400 MG tablet Take 1 tablet by mouth daily.     Marland Kitchen glucose blood (ONE TOUCH ULTRA TEST) test strip USE AS INSTRUCTED TO CHECK BLOOD SUGAR ONCE A DAY Dx code: E11.9 50 each 5  . HYDROcodone-acetaminophen (NORCO/VICODIN) 5-325 MG tablet Take 1 tablet by mouth 3 (three) times daily as needed for moderate pain. 90 tablet 0  . loperamide (IMODIUM) 2 MG capsule Take 2 mg by mouth 4 (four) times daily as needed for diarrhea or loose stools.    Marland Kitchen loratadine (CLARITIN) 10 MG tablet Take 10 mg by mouth daily as needed for allergies.    . methocarbamol (ROBAXIN) 500 MG tablet Take 0.5-1 tablets (250-500 mg total) by mouth 3 (three) times daily as needed for muscle spasms. 20 tablet 0  . metoprolol tartrate (LOPRESSOR) 25  MG tablet TAKE ONE AND HALF TABLETS (37.5 MG TOTAL) BY MOUTH 2 (TWO) TIMES DAILY. 270 tablet 1  . Multiple Vitamin (MULTIVITAMIN) tablet Take 1 tablet by mouth daily. Patient reports takes a new vitamin to replace her centrum as recommended by her eye doctor.    . nitroGLYCERIN (NITROSTAT) 0.4 MG SL tablet Place 1 tablet (0.4 mg total) under the tongue every 5 (five) minutes as needed for chest pain. 25 tablet 3  . nystatin-triamcinolone ointment (MYCOLOG) APPLY 1 APPLICATION TOPICALLY 2 (TWO) TIMES DAILY. AS NEEDED FOR ITCHING 60 g 1  . ONETOUCH VERIO test strip USE TO TEST BLOOD SUGAR ONCE DAILY 100 each 6  . pantoprazole (PROTONIX) 40 MG tablet Take 1  tablet (40 mg total) by mouth daily. 30 tablet 3  . pioglitazone (ACTOS) 30 MG tablet TAKE 1 TABLET BY MOUTH EVERY DAY 90 tablet 3  . pravastatin (PRAVACHOL) 20 MG tablet TAKE 1 TABLET BY MOUTH EVERY DAY 90 tablet 3  . Probiotic Product (ALIGN) 4 MG CAPS Take 1 capsule (4 mg total) by mouth daily.    . ramipril (ALTACE) 5 MG capsule TAKE ONE CAPSULE BY MOUTH EVERYDAY FOR BLOOD PRESSURE 90 capsule 1  . sitaGLIPtin (JANUVIA) 100 MG tablet Take 1 tablet (100 mg total) by mouth daily. 30 tablet 2  . traZODone (DESYREL) 150 MG tablet Take 150 mg by mouth at bedtime.     . triamcinolone cream (KENALOG) 0.1 % APPLY 1 APPLICATION TOPICALLY 2 (TWO) TIMES DAILY.  1  . vitamin B-12 (CYANOCOBALAMIN) 1000 MCG tablet Take 1,000 mcg by mouth daily.    . vitamin E 400 UNIT capsule Take 400 Units by mouth daily.     No facility-administered medications prior to visit.      Per HPI unless specifically indicated in ROS section below Review of Systems Objective:    BP 118/68 (BP Location: Left Arm, Patient Position: Sitting, Cuff Size: Normal)   Pulse 87   Temp 97.6 F (36.4 C) (Oral)   Ht 4\' 9"  (1.448 m)   Wt 142 lb (64.4 kg)   SpO2 97%   BMI 30.73 kg/m   Wt Readings from Last 3 Encounters:  03/30/18 142 lb (64.4 kg)  03/26/18 147 lb 4.3 oz (66.8 kg)  03/13/18 143 lb (64.9 kg)    Physical Exam Vitals signs and nursing note reviewed.  Constitutional:      General: She is not in acute distress.    Appearance: Normal appearance.     Comments: In wheelchair today  Musculoskeletal:     Right lower leg: Edema (mild) present.     Left lower leg: Edema (mild) present.  Skin:    General: Skin is warm.     Findings: No erythema.     Comments: No significant erythema of RLE Lateral RLE wound shallow ulcer with clean granulation tissue base Medial RLE 2 separate scabbed wounds without surrounding induration or erythema  Neurological:     Mental Status: She is alert.        Assessment & Plan:    Problem List Items Addressed This Visit    Varicose veins of right lower extremity with both ulcer of calf and inflammation ([place])    Lateral wound worse but clean - states she has wound clinic appt on Monday.  Dressed in office today with sterile dressing. I offered vaseline or triple abx ointment to place on wound, pt declined and states she will apply santyl with dressing when she gets home.  Type 2 diabetes mellitus with neurological manifestations, controlled ([place])   MRSA cellulitis - Primary    Continues doxycycline, no significant erythema or warmth of RLE. Encouraged she finish abx course.       Leg wound, right, subsequent encounter   Diabetes mellitus type 2 with peripheral artery disease ([place])       No orders of the defined types were placed in this encounter.  No orders of the defined types were placed in this encounter.   Follow up plan: No follow-ups on file.  Ria Bush, MD

## 2018-03-29 NOTE — Telephone Encounter (Signed)
plz call pt - has she tried other medication like protonix before? I don't see where we've tried other meds.

## 2018-03-29 NOTE — Telephone Encounter (Signed)
I have sent in protonix 40mg  daily to take in place of dexilant.

## 2018-03-29 NOTE — Telephone Encounter (Signed)
Spoke with pt relaying Dr. G's message. Pt verbalizes understanding.  

## 2018-03-29 NOTE — Telephone Encounter (Signed)
[name] with [facility] called triage line requesting to resume Skilled nursing 1x a week for 1 week, 2x a week for 1 week, 1 x a week for 1 additional week, also pt refused Home health aid so they just want to make sure Dr. Darnell Level was aware of that too  CB # [phone]

## 2018-03-29 NOTE — Telephone Encounter (Signed)
Agree with this. Thank you. Noted.

## 2018-03-30 ENCOUNTER — Encounter: Payer: Self-pay | Admitting: Family Medicine

## 2018-03-30 ENCOUNTER — Ambulatory Visit (INDEPENDENT_AMBULATORY_CARE_PROVIDER_SITE_OTHER): Payer: Medicare Other | Admitting: Family Medicine

## 2018-03-30 VITALS — BP 118/68 | HR 87 | Temp 97.6°F | Ht <= 58 in | Wt 142.0 lb

## 2018-03-30 DIAGNOSIS — S81801D Unspecified open wound, right lower leg, subsequent encounter: Secondary | ICD-10-CM

## 2018-03-30 DIAGNOSIS — E1149 Type 2 diabetes mellitus with other diabetic neurological complication: Secondary | ICD-10-CM

## 2018-03-30 DIAGNOSIS — I83212 Varicose veins of right lower extremity with both ulcer of calf and inflammation: Secondary | ICD-10-CM | POA: Diagnosis not present

## 2018-03-30 DIAGNOSIS — L0390 Cellulitis, unspecified: Secondary | ICD-10-CM

## 2018-03-30 DIAGNOSIS — B9562 Methicillin resistant Staphylococcus aureus infection as the cause of diseases classified elsewhere: Secondary | ICD-10-CM

## 2018-03-30 DIAGNOSIS — L97212 Non-pressure chronic ulcer of right calf with fat layer exposed: Secondary | ICD-10-CM

## 2018-03-30 DIAGNOSIS — E1151 Type 2 diabetes mellitus with diabetic peripheral angiopathy without gangrene: Secondary | ICD-10-CM | POA: Diagnosis not present

## 2018-03-30 LAB — AEROBIC/ANAEROBIC CULTURE W GRAM STAIN (SURGICAL/DEEP WOUND)

## 2018-03-30 NOTE — Telephone Encounter (Signed)
Left message on vm for [name], of [facility], informing her Dr. Darnell Level is giving verbal orders to resume skilled nursing.

## 2018-03-31 DIAGNOSIS — B9562 Methicillin resistant Staphylococcus aureus infection as the cause of diseases classified elsewhere: Secondary | ICD-10-CM

## 2018-03-31 DIAGNOSIS — L0390 Cellulitis, unspecified: Principal | ICD-10-CM

## 2018-03-31 HISTORY — DX: Cellulitis, unspecified: L03.90

## 2018-03-31 HISTORY — DX: Methicillin resistant Staphylococcus aureus infection as the cause of diseases classified elsewhere: B95.62

## 2018-03-31 NOTE — Assessment & Plan Note (Signed)
Continues doxycycline, no significant erythema or warmth of RLE. Encouraged she finish abx course.

## 2018-03-31 NOTE — Assessment & Plan Note (Signed)
Lateral wound worse but clean - states she has wound clinic appt on Monday.  Dressed in office today with sterile dressing. I offered vaseline or triple abx ointment to place on wound, pt declined and states she will apply santyl with dressing when she gets home.

## 2018-04-02 ENCOUNTER — Encounter: Payer: Medicare Other | Admitting: Family Medicine

## 2018-04-02 DIAGNOSIS — L97819 Non-pressure chronic ulcer of other part of right lower leg with unspecified severity: Secondary | ICD-10-CM | POA: Insufficient documentation

## 2018-04-02 DIAGNOSIS — L539 Erythematous condition, unspecified: Secondary | ICD-10-CM | POA: Diagnosis not present

## 2018-04-02 DIAGNOSIS — S81801A Unspecified open wound, right lower leg, initial encounter: Secondary | ICD-10-CM | POA: Diagnosis not present

## 2018-04-02 DIAGNOSIS — E11622 Type 2 diabetes mellitus with other skin ulcer: Secondary | ICD-10-CM | POA: Insufficient documentation

## 2018-04-03 DIAGNOSIS — E1151 Type 2 diabetes mellitus with diabetic peripheral angiopathy without gangrene: Secondary | ICD-10-CM | POA: Diagnosis not present

## 2018-04-03 DIAGNOSIS — L97211 Non-pressure chronic ulcer of right calf limited to breakdown of skin: Secondary | ICD-10-CM | POA: Diagnosis not present

## 2018-04-03 DIAGNOSIS — L97812 Non-pressure chronic ulcer of other part of right lower leg with fat layer exposed: Secondary | ICD-10-CM | POA: Diagnosis not present

## 2018-04-03 DIAGNOSIS — E11622 Type 2 diabetes mellitus with other skin ulcer: Secondary | ICD-10-CM | POA: Diagnosis not present

## 2018-04-03 DIAGNOSIS — I872 Venous insufficiency (chronic) (peripheral): Secondary | ICD-10-CM | POA: Diagnosis not present

## 2018-04-03 DIAGNOSIS — I2510 Atherosclerotic heart disease of native coronary artery without angina pectoris: Secondary | ICD-10-CM | POA: Diagnosis not present

## 2018-04-04 NOTE — Progress Notes (Signed)
VALMAI, VANDENBERGHE ([id number]) Visit Report for 04/02/2018 Chief Complaint Document Details Patient Name: [name], [name] Date of Service: 04/02/2018 4:00 PM Medical Record Number: [id number] Patient Account Number: [id number] Date of Birth/Sex: [birth year] (85 y.o. F) Treating RN: Harold Barban Primary Care Provider: Ria Bush Other Clinician: Referring Provider: Ria Bush Treating Provider/Extender: Oneida Arenas in Treatment: 1 Information Obtained from: Patient Chief Complaint RLE wounds Electronic Signature(s) Signed: 04/02/2018 10:32:40 PM By: Beather Arbour FNP-C Entered By: Beather Arbour on 04/02/2018 17:38:44 [name], [name] ([id number]) -------------------------------------------------------------------------------- HPI Details Patient Name: [name], [name] Date of Service: 04/02/2018 4:00 PM Medical Record Number: [id number] Patient Account Number: [id number] Date of Birth/Sex: Oct 20, 1927 (85 y.o. F) Treating RN: Harold Barban Primary Care Provider: Ria Bush Other Clinician: Referring Provider: Ria Bush Treating Provider/Extender: Oneida Arenas in Treatment: 1 History of Present Illness HPI Description: Readmission: 03/23/18 on evaluation today patient presents for readmission entire clinic concerning issues that she has been having for the past couple of weeks in regard to her right lower extremity. The medial injury actually occurred when her dog tried to take a blanket from her and subsequently scratched her with his toenail. Subsequently the lateral injury was a wound where she dropped a knife in her kitchen and hit the side of her leg. With that being said both have been open for some time and home health nurse that has been coming out marked for her where the redness was several days ago. Nonetheless this has spread will be on the margin of what was marked. She was placed on Keflex which was started two  days ago but at this point it does not seem to have helped with any improvement in the overall appearance of the wounds. No fevers, chills, nausea, or vomiting noted at this time. 04/02/18 Seen today for follow up and management of right lower extremity wounds. Right lateral wound with adherent slough. She is complaining of pain to touch which limited exam. Recently treated in the ED for cellulitis and wound culture positive for MRSA. Completed a dose of Vancomycin and transitioned to doxycycline by mouth. Erythema remains present with improvement since last visit. Right medial wound with scab present. No fevers, chills, nausea, or vomiting noted at this time. Electronic Signature(s) Signed: 04/02/2018 10:32:40 PM By: Beather Arbour FNP-C Entered By: Beather Arbour on 04/02/2018 17:44:26 [name], [name] ([id number]) -------------------------------------------------------------------------------- Physical Exam Details Patient Name: [name], [name] Date of Service: 04/02/2018 4:00 PM Medical Record Number: [id number] Patient Account Number: [id number] Date of Birth/Sex: 05-Apr-1927 (85 y.o. F) Treating RN: Harold Barban Primary Care Provider: Ria Bush Other Clinician: Referring Provider: Ria Bush Treating Provider/Extender: Beather Arbour Weeks in Treatment: 1 Constitutional appears in no distress. Respiratory Respiratory effort is easy and symmetric bilaterally. Rate is normal at rest and on room air.. Cardiovascular Edema present right LE. Integumentary (Hair, Skin) open wound right lateral leg. Notes Right lower leg with trace edema and erythema. Right lateral wound with adherent slough. No necrotic tissue. She complained of pain with touch and attempting to clean wounds. Right medial wounds with good granulation and partially healed. Electronic Signature(s) Signed: 04/02/2018 10:32:40 PM By: Beather Arbour FNP-C Entered By: Beather Arbour on 04/02/2018  17:48:26 [name] ([id number]) -------------------------------------------------------------------------------- Physician Orders Details Patient Name: [name], [name] Date of Service: 04/02/2018 4:00 PM Medical Record Number: [id number] Patient Account Number: [id number] Date of Birth/Sex: 1927-04-26 (85 y.o. F) Treating RN: Harold Barban Primary Care Provider: Ria Bush Other Clinician: Referring Provider: Ria Bush  Treating Provider/Extender: Beather Arbour Weeks in Treatment: 1 Verbal / Phone Orders: No Diagnosis Coding Wound Cleansing Wound #7 Right,Medial Lower Leg o Clean wound with Normal Saline. Wound #8 Right,Lateral Lower Leg o Clean wound with Normal Saline. Primary Wound Dressing Wound #7 Right,Medial Lower Leg o Other: - Triple antibiotic ointment Wound #8 Right,Lateral Lower Leg o Santyl Ointment Secondary Dressing Wound #7 Right,Medial Lower Leg o ABD and Kerlix/Conform - secured with netting Wound #8 Right,Lateral Lower Leg o ABD and Kerlix/Conform - secured with netting Dressing Change Frequency Wound #7 Right,Medial Lower Leg o Change dressing every other day. Wound #8 Right,Lateral Lower Leg o Change dressing every other day. Follow-up Appointments Wound #7 Right,Medial Lower Leg o Return Appointment in 1 week. Notes Environmental health practitioner) Signed: 04/02/2018 10:32:40 PM By: Beather Arbour FNP-C Entered By: Beather Arbour on 04/02/2018 17:48:36 [name], [name] ([id number]) -------------------------------------------------------------------------------- Problem List Details Patient Name: [name], [name] Date of Service: 04/02/2018 4:00 PM Medical Record Number: [id number] Patient Account Number: [id number] Date of Birth/Sex: 12/03/1927 (85 y.o. 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 Date of Service: 04/02/2018 4:00 PM Medical Record Number: [id number] Patient Account Number: [id number] Date of Birth/Sex: 1927/04/27 (85 y.o. F) Treating RN: Harold Barban Primary Care Provider: Ria Bush Other Clinician: Referring Provider: Ria Bush Treating Provider/Extender: Oneida Arenas in Treatment: 1 Subjective Chief Complaint Information obtained from Patient RLE wounds History of Present Illness (HPI) Readmission: 03/23/18 on evaluation today patient presents for readmission entire clinic concerning issues that she has been having for the past couple of weeks in regard to her right lower extremity. The medial injury actually occurred when her dog tried to take a blanket from her and subsequently scratched her with his toenail. Subsequently the lateral injury was a wound where she dropped a knife in her kitchen and hit the side of her leg. With that being said both have been open for some time and home health nurse that has been coming out marked for her where the redness was several days ago. Nonetheless this has  spread will be on the margin of what was marked. She was placed on Keflex which was started two days ago but at this point it does not seem to have helped with any improvement in the overall appearance of the wounds. No fevers, chills, nausea, or vomiting noted at this time. 04/02/18 Seen today for follow up and management of right lower extremity wounds. Right lateral wound with adherent slough. She is complaining of pain to touch which limited exam. Recently treated in the ED for cellulitis and wound culture positive for MRSA. Completed a dose of Vancomycin and transitioned to doxycycline by mouth. Erythema remains present with improvement since last visit. Right medial wound with scab present. No fevers, chills, nausea, or vomiting noted at this time.  Patient History Information obtained from Patient. Family History Diabetes - Father,Siblings,Child, Hypertension - Child, Lung Disease - Child, Stroke - Mother, No family history of Cancer, Heart Disease, Kidney Disease, Seizures, Thyroid Problems. Social History Former smoker, Marital Status - Single, Alcohol Use - Never, Drug Use - No History, Caffeine Use - Daily. Medical And Surgical History Notes Constitutional Symptoms (General Health) High Blood Pressure, open heart surgery, gall stone, hysterectomy; Fluid pill, Anxiety Review of Systems (ROS) Constitutional Symptoms (General Health) The patient has no complaints or symptoms. Respiratory The patient has no complaints or symptoms. Cardiovascular The patient has no complaints or symptoms. Integumentary (Skin) Complains or has symptoms of Wounds - right leg. [name], [name] ([id number]) Objective Constitutional appears in no distress. Vitals Time Taken: 4:08 PM, Temperature: 98.0 F, Pulse: 72 bpm, Respiratory Rate: 18 breaths/min, Blood Pressure: 122/51 mmHg. Respiratory Respiratory effort is easy and symmetric bilaterally. Rate is normal at rest and on room  air.. Cardiovascular Edema present right LE. General Notes: Right lower leg with trace edema and erythema. Right lateral wound with adherent slough. No necrotic tissue. She complained of pain with touch and attempting to clean wounds. Right medial wounds with good granulation and partially healed. Integumentary (Hair, Skin) open wound right lateral leg. Wound #7 status is Open. Original cause of wound was Trauma. The wound is located on the Right,Medial Lower Leg. The wound measures 1cm length x 0.8cm width x 0.1cm depth; 0.628cm^2 area and 0.063cm^3 volume. Wound #8 status is Open. Original cause of wound was Trauma. The wound is located on the Right,Lateral Lower Leg. The wound measures 3cm length x 1.7cm width x 0.3cm depth; 4.006cm^2 area and 1.202cm^3 volume. Assessment Active Problems ICD-10 Chronic venous hypertension (idiopathic) with ulcer and inflammation of right lower extremity Type 2 diabetes mellitus with other skin ulcer Non-pressure chronic ulcer of other part of right lower leg with fat layer exposed Cellulitis of right lower limb Plan Kleiber, Dereon C. ([id number]) Wound Cleansing: Wound #7 Right,Medial Lower Leg: Clean wound with Normal Saline. Wound #8 Right,Lateral Lower Leg: Clean wound with Normal Saline. Primary Wound Dressing: Wound #7 Right,Medial Lower Leg: Other: - Triple antibiotic ointment Wound #8 Right,Lateral Lower Leg: Santyl Ointment Secondary Dressing: Wound #7 Right,Medial Lower Leg: ABD and Kerlix/Conform - secured with netting Wound #8 Right,Lateral Lower Leg: ABD and Kerlix/Conform - secured with netting Dressing Change Frequency: Wound #7 Right,Medial Lower Leg: Change dressing every other day. Wound #8 Right,Lateral Lower Leg: Change dressing every other day. Follow-up Appointments: Wound #7 Right,Medial Lower Leg: Return Appointment in 1 week. General Notes: Environmental health practitioner) Signed: 04/02/2018 10:32:40 PM By:  Beather Arbour FNP-C Entered By: Beather Arbour on 04/02/2018 17:48:42 [name], [name] ([id number]) -------------------------------------------------------------------------------- ROS/PFSH Details Patient Name: [name], [name] Date of Service: 04/02/2018 4:00 PM Medical Record Number: [id number] Patient Account Number: [id number] Date of Birth/Sex: 05/22/1927 (85 y.o. F) Treating RN: Harold Barban Primary Care Provider: Ria Bush Other Clinician: Referring Provider: Ria Bush Treating Provider/Extender: Oneida Arenas in Treatment: 1 Information Obtained From Patient Wound History Integumentary (Skin) Complaints and Symptoms: Positive for: Wounds - right leg Medical History: Negative for: History of Burn; History of pressure wounds Constitutional Symptoms (General Health) Complaints and Symptoms: No Complaints or Symptoms Medical History: Past Medical History Notes: High Blood Pressure, open heart surgery, gall stone, hysterectomy; Fluid pill, Anxiety Eyes Medical History: Positive for: Cataracts - removed Negative for: Glaucoma; Optic Neuritis Hematologic/Lymphatic Medical History: Positive for: Anemia Negative for: Hemophilia; Human Immunodeficiency Virus; Lymphedema; Sickle  Cell Disease Respiratory Complaints and Symptoms: No Complaints or Symptoms Medical History: Negative for: Aspiration; Asthma; Chronic Obstructive Pulmonary Disease (COPD); Pneumothorax; Sleep Apnea; Tuberculosis Cardiovascular Complaints and Symptoms: No Complaints or Symptoms Medical History: Positive for: Arrhythmia; Coronary Artery Disease; Hypertension; Myocardial Infarction Negative for: Angina; Congestive Heart Failure; Deep Vein Thrombosis; Hypotension; Peripheral Arterial Disease; Boyack, Reannon C. ([id number]) Peripheral Venous Disease; Phlebitis Endocrine Medical History: Positive for: Type II Diabetes Negative for: Type I Diabetes Time with  diabetes: no idea Treated with: Oral agents Blood sugar tested every day: No Genitourinary Medical History: Negative for: End Stage Renal Disease Immunological Medical History: Negative for: Lupus Erythematosus; Raynaudos; Scleroderma Musculoskeletal Medical History: Positive for: Osteoarthritis Negative for: Gout; Rheumatoid Arthritis; Osteomyelitis Neurologic Medical History: Negative for: Dementia; Neuropathy; Quadriplegia; Paraplegia; Seizure Disorder Oncologic Medical History: Negative for: Received Chemotherapy; Received Radiation HBO Extended History Items Eyes: Cataracts Immunizations Pneumococcal Vaccine: Received Pneumococcal Vaccination: Yes Implantable Devices Family and Social History Cancer: No; Diabetes: Yes - Father,Siblings,Child; Heart Disease: No; Hypertension: Yes - Child; Kidney Disease: No; Lung Disease: Yes - Child; Seizures: No; Stroke: Yes - Mother; Thyroid Problems: No; Former smoker; Marital Status - Single; Alcohol Use: Never; Drug Use: No History; Caffeine Use: Daily; Financial Concerns: No; Food, Clothing or Shelter Needs: No; Support System Lacking: No; Transportation Concerns: No; Advanced Directives: Yes (Not Provided); Patient does not want information on Advanced Directives; Living Will: Yes (Not Provided) Physician Affirmation I have reviewed and agree with the above information. [name], [name] ([id number]) Electronic Signature(s) Signed: 04/02/2018 10:32:40 PM By: Beather Arbour FNP-C Signed: 04/03/2018 4:38:01 PM By: Harold Barban Entered By: Beather Arbour on 04/02/2018 17:44:54 [name], [name] ([id number]) -------------------------------------------------------------------------------- SuperBill Details Patient Name: Hem, Bianca C. Date of Service: 04/02/2018 Medical Record Number: [id number] Patient Account Number: [id number] Date of Birth/Sex: [birth year] (85 y.o. F) Treating RN: Harold Barban Primary Care Provider:  Ria Bush Other Clinician: Referring Provider: Ria Bush Treating Provider/Extender: Oneida Arenas in Treatment: 1 Diagnosis Coding ICD-10 Codes Code Description [phone] Chronic venous hypertension (idiopathic) with ulcer and inflammation of right lower extremity E11.622 Type 2 diabetes mellitus with other skin ulcer L97.812 Non-pressure chronic ulcer of other part of right lower leg with fat layer exposed L03.115 Cellulitis of right lower limb Facility Procedures CPT4 Code: [id number] Description: 99213 - WOUND CARE VISIT-LEV 3 EST PT Modifier: Quantity: 1 Physician Procedures CPT4 Code Description: [id number] 58850 - WC PHYS LEVEL 2 - EST PT ICD-10 Diagnosis Description Y77.412 Non-pressure chronic ulcer of other part of right lower leg w Modifier: ith fat layer expo Quantity: 1 sed Electronic Signature(s) Signed: 04/02/2018 10:32:40 PM By: Beather Arbour FNP-C Entered By: Beather Arbour on 04/02/2018 17:49:02

## 2018-04-04 NOTE — Progress Notes (Signed)
[name], [name] ([id number]) Visit Report for 04/02/2018 Arrival Information Details Patient Name: [name], [name] Date of Service: 04/02/2018 4:00 PM Medical Record Number: [id number] Patient Account Number: [id number] Date of Birth/Sex: 12-13-1927 (85 y.o. F) Treating RN: Harold Barban Primary Care Amos Micheals: Ria Bush Other Clinician: Referring Aeralyn Barna: Ria Bush Treating Andre Swander/Extender: Oneida Arenas in Treatment: 1 Visit Information History Since Last Visit Added or deleted any medications: Yes Patient Arrived: Walker Any new allergies or adverse reactions: No Arrival Time: 16:06 Had a fall or experienced change in No Accompanied By: daughter in law activities of daily living that may affect Transfer Assistance: None risk of falls: Patient Identification Verified: Yes Signs or symptoms of abuse/neglect since last visito No Secondary Verification Process Completed: Yes Hospitalized since last visit: Yes Has Dressing in Place as Prescribed: Yes Pain Present Now: Yes Electronic Signature(s) Signed: 04/03/2018 3:50:59 PM By: Lorine Bears RCP, RRT, CHT Entered By: Lorine Bears on 04/02/2018 16:08:08 Winkel, [name] ([id number]) -------------------------------------------------------------------------------- Clinic Level of Care Assessment Details Patient Name: [name], [name] Date of Service: 04/02/2018 4:00 PM Medical Record Number: [id number] Patient Account Number: [id number] Date of Birth/Sex: 11-Apr-1927 (85 y.o. F) Treating RN: Harold Barban Primary Care Ollie Esty: Ria Bush Other Clinician: Referring Margarine Grosshans: Ria Bush Treating Lucrecia Mcphearson/Extender: Oneida Arenas in Treatment: 1 Clinic Level of Care Assessment Items TOOL 4 Quantity Score []  - Use when only an EandM is performed on FOLLOW-UP visit 0 ASSESSMENTS - Nursing Assessment / Reassessment X - Reassessment of  Co-morbidities (includes updates in patient status) 1 10 X- 1 5 Reassessment of Adherence to Treatment Plan ASSESSMENTS - Wound and Skin Assessment / Reassessment X - Simple Wound Assessment / Reassessment - one wound 1 5 []  - 0 Complex Wound Assessment / Reassessment - multiple wounds []  - 0 Dermatologic / Skin Assessment (not related to wound area) ASSESSMENTS - Focused Assessment []  - Circumferential Edema Measurements - multi extremities 0 []  - 0 Nutritional Assessment / Counseling / Intervention []  - 0 Lower Extremity Assessment (monofilament, tuning fork, pulses) []  - 0 Peripheral Arterial Disease Assessment (using hand held doppler) ASSESSMENTS - Ostomy and/or Continence Assessment and Care []  - Incontinence Assessment and Management 0 []  - 0 Ostomy Care Assessment and Management (repouching, etc.) PROCESS - Coordination of Care X - Simple Patient / Family Education for ongoing care 1 15 []  - 0 Complex (extensive) Patient / Family Education for ongoing care X- 1 10 Staff obtains Programmer, systems, Records, Test Results / Process Orders X- 1 10 Staff telephones HHA, Nursing Homes / Clarify orders / etc []  - 0 Routine Transfer to another Facility (non-emergent condition) []  - 0 Routine Hospital Admission (non-emergent condition) []  - 0 New Admissions / Biomedical engineer / Ordering NPWT, Apligraf, etc. []  - 0 Emergency Hospital Admission (emergent condition) X- 1 10 Simple Discharge Coordination [name], [name] ([id number]) []  - 0 Complex (extensive) Discharge Coordination PROCESS - Special Needs []  - Pediatric / Minor Patient Management 0 []  - 0 Isolation Patient Management []  - 0 Hearing / Language / Visual special needs []  - 0 Assessment of Community assistance (transportation, D/C planning, etc.) []  - 0 Additional assistance / Altered mentation []  - 0 Support Surface(s) Assessment (bed, cushion, seat, etc.) INTERVENTIONS - Wound Cleansing / Measurement X  - Simple Wound Cleansing - one wound 1 5 []  - 0 Complex Wound Cleansing - multiple wounds X- 1 5 Wound Imaging (photographs - any number of wounds) []  - 0 Wound Tracing (instead of photographs)  X- 1 5 Simple Wound Measurement - one wound []  - 0 Complex Wound Measurement - multiple wounds INTERVENTIONS - Wound Dressings X - Small Wound Dressing one or multiple wounds 1 10 []  - 0 Medium Wound Dressing one or multiple wounds []  - 0 Large Wound Dressing one or multiple wounds []  - 0 Application of Medications - topical []  - 0 Application of Medications - injection INTERVENTIONS - Miscellaneous []  - External ear exam 0 []  - 0 Specimen Collection (cultures, biopsies, blood, body fluids, etc.) []  - 0 Specimen(s) / Culture(s) sent or taken to Lab for analysis []  - 0 Patient Transfer (multiple staff / Civil Service fast streamer / Similar devices) []  - 0 Simple Staple / Suture removal (25 or less) []  - 0 Complex Staple / Suture removal (26 or more) []  - 0 Hypo / Hyperglycemic Management (close monitor of Blood Glucose) []  - 0 Ankle / Brachial Index (ABI) - do not check if billed separately X- 1 5 Vital Signs [name], [name] ([id number]) Has the patient been seen at the hospital within the last three years: Yes Total Score: 95 Level Of Care: New/Established - Level 3 Electronic Signature(s) Signed: 04/03/2018 4:38:01 PM By: Harold Barban Entered By: Harold Barban on 04/02/2018 17:02:18 Laham, Keyairra Loletha Grayer ([id number]) -------------------------------------------------------------------------------- Encounter Discharge Information Details Patient Name: [name], [name] Date of Service: 04/02/2018 4:00 PM Medical Record Number: [id number] Patient Account Number: [id number] Date of Birth/Sex: [birth year] (85 y.o. F) Treating RN: Harold Barban Primary Care Diamante Rubin: Ria Bush Other Clinician: Referring Averill Pons: Ria Bush Treating Jaggar Benko/Extender: Oneida Arenas  in Treatment: 1 Encounter Discharge Information Items Discharge Condition: Stable Ambulatory Status: Walker Discharge Destination: Home Transportation: Private Auto Accompanied By: self Schedule Follow-up Appointment: Yes Clinical Summary of Care: Electronic Signature(s) Signed: 04/03/2018 4:38:01 PM By: Harold Barban Entered By: Harold Barban on 04/02/2018 17:14:50 Antuna, Kelleigh C. ([id number]) -------------------------------------------------------------------------------- Lower Extremity Assessment Details Patient Name: Lumbert, Lucindia C. Date of Service: 04/02/2018 4:00 PM Medical Record Number: [id number] Patient Account Number: [id number] Date of Birth/Sex: 11-14-1927 (85 y.o. F) Treating RN: Cornell Barman Primary Care Marcell Pfeifer: Ria Bush Other Clinician: Referring Shital Crayton: Ria Bush Treating Racheal Mathurin/Extender: Beather Arbour Weeks in Treatment: 1 Edema Assessment Assessed: [Left: No] [Right: Yes] Edema: [Left: Ye] [Right: s] Calf Left: Right: Point of Measurement: 30 cm From Medial Instep cm 32.5 cm Ankle Left: Right: Point of Measurement: 10 cm From Medial Instep cm 19.5 cm Vascular Assessment Pulses: Dorsalis Pedis Palpable: [Right:Yes] Posterior Tibial Extremity colors, hair growth, and conditions: Extremity Color: [Right:Red] Hair Growth on Extremity: [Right:No] Temperature of Extremity: [Right:Warm] Capillary Refill: [Right:< 3 seconds] Toe Nail Assessment Left: Right: Thick: Yes Discolored: Yes Deformed: Yes Improper Length and Hygiene: Yes Electronic Signature(s) Signed: 04/03/2018 4:45:10 PM By: Gretta Cool, BSN, RN, CWS, Kim RN, BSN Entered By: Gretta Cool, BSN, RN, CWS, Kim on 04/02/2018 16:34:59 Wurth, [name] ([id number]) -------------------------------------------------------------------------------- Multi Wound Chart Details Patient Name: Mcphie, Destenee C. Date of Service: 04/02/2018 4:00 PM Medical Record Number:  [id number] Patient Account Number: [id number] Date of Birth/Sex: 1927/05/14 (85 y.o. 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] [N/A:N/A] Date  Acquired: [7:03/02/2018] [8:03/09/2018] [N/A:N/A] Weeks of Treatment: [7:1] [8:1] [N/A:N/A] Wound Status: [7:Open] [8:Open] [N/A:N/A] Measurements L x W x D [7:1x0.8x0.1] [8:3x1.7x0.3] [N/A:N/A] (cm) Area (cm) : [7:0.628] [3:7.342] [N/A:N/A] Volume (cm) : [8:7.681] [8:1.202] [N/A:N/A] % Reduction in Area: [7:64.50%] [8:-112.50%] [N/A:N/A] % Reduction in Volume: [7:64.40%] [8:-112.70%] [N/A:N/A] Classification: [7:Grade 2] [8:Grade 2] [N/A:N/A] Periwound Skin Texture: [7:No Abnormalities Noted] [8:No Abnormalities Noted] [N/A:N/A] Periwound Skin Moisture: [7:No Abnormalities Noted] [8:No Abnormalities Noted] [N/A:N/A] Periwound Skin Color: [7:No Abnormalities Noted No] [8:No Abnormalities Noted No] [N/A:N/A N/A] Treatment Notes Wound #7 (Right, Medial Lower Leg) Notes Triple antibiotic ointment, gauze, kerlix and netting Wound #8 (Right, Lateral Lower Leg) Notes santyl, abd, kerlix and netting Electronic Signature(s) Signed: 04/02/2018 10:32:40 PM By: Beather Arbour FNP-C Entered By: Beather Arbour on 04/02/2018 17:38:37 Grivas, [name] ([id number]) Herbert, [name]  ([id number]) -------------------------------------------------------------------------------- [place] Details Patient Name: Mestre, Cathrine C. Date of Service: 04/02/2018 4:00 PM Medical Record Number: [id number] Patient Account Number: [id number] Date of Birth/Sex: May 01, 1927 (85 y.o. F) Treating RN: Harold Barban Primary Care Damien Cisar: Ria Bush Other Clinician: Referring Aalivia Mcgraw: Ria Bush Treating Jacqui Headen/Extender: Oneida Arenas in Treatment: 1 Active Inactive Abuse / Safety / Falls / Self Care Management Nursing Diagnoses: Potential for falls Goals: Patient will not experience any injury related to falls Date Initiated: 03/23/2018 Target Resolution Date: 06/09/2018 Goal Status: Active Interventions: Assess fall risk on admission and as needed Notes: Orientation to the Wound Care Program Nursing Diagnoses: Knowledge deficit related to the wound healing center program Goals: Patient/caregiver will verbalize understanding of the [facility] Program Date Initiated: 03/23/2018 Target Resolution Date: 06/09/2018 Goal Status: Active Interventions: Provide education on orientation to the wound center Notes: Pain, Acute or Chronic Nursing Diagnoses: Pain, acute or chronic: actual or potential Goals: Patient will verbalize adequate pain control and receive pain control interventions during procedures as needed Date Initiated: 03/23/2018 Target Resolution Date: 06/09/2018 Goal Status: Active Interventions: Assess comfort goal upon admission Mikowski, Milka C. ([id number]) Notes: Wound/Skin Impairment Nursing Diagnoses: Impaired tissue integrity Goals: Ulcer/skin breakdown will heal within 14 weeks Date Initiated: 03/23/2018 Target Resolution Date: 06/09/2018 Goal Status: Active Interventions: Assess patient/caregiver ability to obtain necessary supplies Assess patient/caregiver ability to perform ulcer/skin care regimen  upon admission and as needed Assess ulceration(s) every visit Notes: Electronic Signature(s) Signed: 04/03/2018 4:38:01 PM By: Harold Barban Entered By: Harold Barban on 04/02/2018 16:48:08 Thivierge, Erryn C. ([id number]) -------------------------------------------------------------------------------- Pain Assessment Details Patient Name: Sperling, Katrena C. Date of Service: 04/02/2018 4:00 PM Medical Record Number: [id number] Patient Account Number: [id number] Date of Birth/Sex: 09-15-1927 (85 y.o. F) Treating RN: Harold Barban Primary Care Geneva Barrero: Ria Bush Other Clinician: Referring Jamicheal Heard: Ria Bush Treating Lety Cullens/Extender: Oneida Arenas in Treatment: 1 Active Problems Location of Pain Severity and Description of Pain Patient Has [name] Yes Site Locations Rate the pain. Current Pain Level: 8 Pain Management and Medication Current Pain Management: Electronic Signature(s) Signed: 04/03/2018 3:50:59 PM By: Lorine Bears RCP, RRT, CHT Signed: 04/03/2018 4:38:01 PM By: Harold Barban Entered By: Lorine Bears on 04/02/2018 16:08:23 Kotowski, [name] ([id number]) -------------------------------------------------------------------------------- Patient/Caregiver Education Details Patient Name: Kolander, Tiani C. Date of Service: 04/02/2018 4:00 PM Medical Record Number: [id number] Patient Account Number: [id number] Date of Birth/Gender: [birth year] (85 y.o. F) Treating RN: Harold Barban Primary Care Physician: Ria Bush Other Clinician: Referring Physician: Ria Bush Treating Physician/Extender: Oneida Arenas in Treatment: 1 Education Assessment Education Provided To: Patient Education Topics Provided Electronic Signature(s) Signed: 04/03/2018 4:38:01 PM By: Harold Barban  Entered By: Harold Barban on 04/02/2018 16:48:26 Nevel, Kateria Loletha Grayer  ([id number]) -------------------------------------------------------------------------------- Wound Assessment Details Patient Name: Privitera, Kathlyn C. Date of Service: 04/02/2018 4:00 PM Medical Record Number: [id number] Patient Account Number: [id number] Date of Birth/Sex: 05-Feb-1928 (85 y.o. F) Treating RN: Cornell Barman Primary Care Iva Montelongo: Ria Bush Other Clinician: Referring Aadhav Uhlig: Ria Bush Treating Dong Nimmons/Extender: Beather Arbour Weeks in Treatment: 1 Wound Status Wound Number: 7 Primary Diabetic Wound/Ulcer of the Lower Etiology: Extremity Wound Location: Right, Medial Lower Leg Wound Status: Open Wounding Event: Trauma Date Acquired: 03/02/2018 Weeks Of Treatment: 1 Clustered Wound: No Wound Measurements Length: (cm) 1 Width: (cm) 0.8 Depth: (cm) 0.1 Area: (cm) 0.628 Volume: (cm) 0.063 % Reduction in Area: 64.5% % Reduction in Volume: 64.4% Wound Description Classification: Grade 2 Periwound Skin Texture Texture Color No Abnormalities Noted: No No Abnormalities Noted: No Moisture No Abnormalities Noted: No Treatment Notes Wound #7 (Right, Medial Lower Leg) Notes Triple antibiotic ointment, gauze, kerlix and netting Electronic Signature(s) Signed: 04/03/2018 4:45:10 PM By: Gretta Cool, BSN, RN, CWS, Kim RN, BSN Entered By: Gretta Cool, BSN, RN, CWS, Kim on 04/02/2018 16:32:10 Reif, [name] ([id number]) -------------------------------------------------------------------------------- Wound Assessment Details Patient Name: Driskill, Jina C. Date of Service: 04/02/2018 4:00 PM Medical Record Number: [id number] Patient Account Number: [id number] Date of Birth/Sex: 01-Oct-1927 (85 y.o. F) Treating RN: Cornell Barman Primary Care Mirka Barbone: Ria Bush Other Clinician: Referring Duilio Heritage: Ria Bush Treating Nellie Pester/Extender: Beather Arbour Weeks in Treatment: 1 Wound Status Wound Number: 8 Primary Diabetic Wound/Ulcer of the  Lower Etiology: Extremity Wound Location: Right, Lateral Lower Leg Wound Status: Open Wounding Event: Trauma Date Acquired: 03/09/2018 Weeks Of Treatment: 1 Clustered Wound: No Wound Measurements Length: (cm) 3 Width: (cm) 1.7 Depth: (cm) 0.3 Area: (cm) 4.006 Volume: (cm) 1.202 % Reduction in Area: -112.5% % Reduction in Volume: -112.7% Wound Description Classification: Grade 2 Periwound Skin Texture Texture Color No Abnormalities Noted: No No Abnormalities Noted: No Moisture No Abnormalities Noted: No Treatment Notes Wound #8 (Right, Lateral Lower Leg) Notes santyl, abd, kerlix and netting Electronic Signature(s) Signed: 04/03/2018 4:45:10 PM By: Gretta Cool, BSN, RN, CWS, Kim RN, BSN Entered By: Gretta Cool, BSN, RN, CWS, Kim on 04/02/2018 16:32:41 Edmister, [name] ([id number]) -------------------------------------------------------------------------------- Vitals Details Patient Name: Weisberg, Onyx C. Date of Service: 04/02/2018 4:00 PM Medical Record Number: [id number] Patient Account Number: [id number] Date of Birth/Sex: [birth year] (85 y.o. F) Treating RN: Harold Barban Primary Care Pia Jedlicka: Ria Bush Other Clinician: Referring Atziri Zubiate: Ria Bush Treating Raedyn Klinck/Extender: Oneida Arenas in Treatment: 1 Vital Signs Time Taken: 16:08 Temperature (F): 98.0 Pulse (bpm): 72 Respiratory Rate (breaths/min): 18 Blood Pressure (mmHg): 122/51 Reference Range: 80 - 120 mg / dl Electronic Signature(s) Signed: 04/03/2018 3:50:59 PM By: Lorine Bears RCP, RRT, CHT Entered By: Lorine Bears on 04/02/2018 16:13:16

## 2018-04-05 ENCOUNTER — Telehealth: Payer: Self-pay | Admitting: Family Medicine

## 2018-04-05 ENCOUNTER — Telehealth: Payer: Self-pay | Admitting: Cardiovascular Disease

## 2018-04-05 DIAGNOSIS — L97812 Non-pressure chronic ulcer of other part of right lower leg with fat layer exposed: Secondary | ICD-10-CM | POA: Diagnosis not present

## 2018-04-05 DIAGNOSIS — I2510 Atherosclerotic heart disease of native coronary artery without angina pectoris: Secondary | ICD-10-CM | POA: Diagnosis not present

## 2018-04-05 DIAGNOSIS — E1151 Type 2 diabetes mellitus with diabetic peripheral angiopathy without gangrene: Secondary | ICD-10-CM | POA: Diagnosis not present

## 2018-04-05 DIAGNOSIS — I872 Venous insufficiency (chronic) (peripheral): Secondary | ICD-10-CM | POA: Diagnosis not present

## 2018-04-05 DIAGNOSIS — L97211 Non-pressure chronic ulcer of right calf limited to breakdown of skin: Secondary | ICD-10-CM | POA: Diagnosis not present

## 2018-04-05 DIAGNOSIS — E11622 Type 2 diabetes mellitus with other skin ulcer: Secondary | ICD-10-CM | POA: Diagnosis not present

## 2018-04-05 NOTE — Telephone Encounter (Signed)
Agree with this. thanks. 

## 2018-04-05 NOTE — Telephone Encounter (Signed)
Verbal order given to [facility]

## 2018-04-05 NOTE — Telephone Encounter (Signed)
Spoke with patient who states she was told to follow-up with cardiology after her recent discharge from [facility]. She was last seen 11/18 and was advised to f/u in 6 months. She states she has a leg infection and is aware that she is also supposed to call vascular surgery for an appointment. I scheduled her to see Dr. Acie Fredrickson on 1/6 and she verbalized understanding and gratitude for my help.

## 2018-04-05 NOTE — Telephone Encounter (Signed)
New Message    Patient was discharged from the hospital and was instructed to make an appt with Dr. Acie Fredrickson and wants to get in with him As soon as possible and the next appt I have for her is 2/24 and she states that's too far away and would like the nurse to call her back about issue.

## 2018-04-05 NOTE — Telephone Encounter (Signed)
Need orders for PT. Can leave message  Pt 2 week 3  1week 1

## 2018-04-06 DIAGNOSIS — E11622 Type 2 diabetes mellitus with other skin ulcer: Secondary | ICD-10-CM | POA: Diagnosis not present

## 2018-04-06 DIAGNOSIS — I2510 Atherosclerotic heart disease of native coronary artery without angina pectoris: Secondary | ICD-10-CM | POA: Diagnosis not present

## 2018-04-06 DIAGNOSIS — E1151 Type 2 diabetes mellitus with diabetic peripheral angiopathy without gangrene: Secondary | ICD-10-CM | POA: Diagnosis not present

## 2018-04-06 DIAGNOSIS — I872 Venous insufficiency (chronic) (peripheral): Secondary | ICD-10-CM | POA: Diagnosis not present

## 2018-04-06 DIAGNOSIS — L97211 Non-pressure chronic ulcer of right calf limited to breakdown of skin: Secondary | ICD-10-CM | POA: Diagnosis not present

## 2018-04-06 DIAGNOSIS — L97812 Non-pressure chronic ulcer of other part of right lower leg with fat layer exposed: Secondary | ICD-10-CM | POA: Diagnosis not present

## 2018-04-09 ENCOUNTER — Encounter: Payer: Self-pay | Admitting: Cardiovascular Disease

## 2018-04-09 ENCOUNTER — Other Ambulatory Visit: Payer: Self-pay | Admitting: Gynecology

## 2018-04-09 ENCOUNTER — Ambulatory Visit (INDEPENDENT_AMBULATORY_CARE_PROVIDER_SITE_OTHER): Payer: Medicare Other | Admitting: Cardiovascular Disease

## 2018-04-09 VITALS — BP 116/60 | HR 75 | Ht <= 58 in | Wt 142.0 lb

## 2018-04-09 DIAGNOSIS — F411 Generalized anxiety disorder: Secondary | ICD-10-CM

## 2018-04-09 DIAGNOSIS — I480 Paroxysmal atrial fibrillation: Secondary | ICD-10-CM

## 2018-04-09 DIAGNOSIS — I2510 Atherosclerotic heart disease of native coronary artery without angina pectoris: Secondary | ICD-10-CM

## 2018-04-09 DIAGNOSIS — E1151 Type 2 diabetes mellitus with diabetic peripheral angiopathy without gangrene: Secondary | ICD-10-CM | POA: Diagnosis not present

## 2018-04-09 DIAGNOSIS — E11622 Type 2 diabetes mellitus with other skin ulcer: Secondary | ICD-10-CM | POA: Diagnosis not present

## 2018-04-09 DIAGNOSIS — M5442 Lumbago with sciatica, left side: Secondary | ICD-10-CM | POA: Diagnosis not present

## 2018-04-09 DIAGNOSIS — M461 Sacroiliitis, not elsewhere classified: Secondary | ICD-10-CM

## 2018-04-09 DIAGNOSIS — M4186 Other forms of scoliosis, lumbar region: Secondary | ICD-10-CM

## 2018-04-09 DIAGNOSIS — R600 Localized edema: Secondary | ICD-10-CM

## 2018-04-09 DIAGNOSIS — Z87891 Personal history of nicotine dependence: Secondary | ICD-10-CM

## 2018-04-09 DIAGNOSIS — M47816 Spondylosis without myelopathy or radiculopathy, lumbar region: Secondary | ICD-10-CM | POA: Diagnosis not present

## 2018-04-09 DIAGNOSIS — E1142 Type 2 diabetes mellitus with diabetic polyneuropathy: Secondary | ICD-10-CM | POA: Diagnosis not present

## 2018-04-09 DIAGNOSIS — L97812 Non-pressure chronic ulcer of other part of right lower leg with fat layer exposed: Secondary | ICD-10-CM | POA: Diagnosis not present

## 2018-04-09 DIAGNOSIS — M5441 Lumbago with sciatica, right side: Secondary | ICD-10-CM | POA: Diagnosis not present

## 2018-04-09 DIAGNOSIS — I5030 Unspecified diastolic (congestive) heart failure: Secondary | ICD-10-CM

## 2018-04-09 DIAGNOSIS — Z993 Dependence on wheelchair: Secondary | ICD-10-CM

## 2018-04-09 DIAGNOSIS — M4807 Spinal stenosis, lumbosacral region: Secondary | ICD-10-CM

## 2018-04-09 DIAGNOSIS — I872 Venous insufficiency (chronic) (peripheral): Secondary | ICD-10-CM | POA: Diagnosis not present

## 2018-04-09 DIAGNOSIS — L97211 Non-pressure chronic ulcer of right calf limited to breakdown of skin: Secondary | ICD-10-CM | POA: Diagnosis not present

## 2018-04-09 DIAGNOSIS — M47814 Spondylosis without myelopathy or radiculopathy, thoracic region: Secondary | ICD-10-CM

## 2018-04-09 DIAGNOSIS — I110 Hypertensive heart disease with heart failure: Secondary | ICD-10-CM

## 2018-04-09 DIAGNOSIS — Z9181 History of falling: Secondary | ICD-10-CM | POA: Diagnosis not present

## 2018-04-09 DIAGNOSIS — Z7982 Long term (current) use of aspirin: Secondary | ICD-10-CM

## 2018-04-09 NOTE — Progress Notes (Signed)
[name] Date of Birth  [birth year] [name] [address]    [place],   [address] [phone]  Fax  [phone]   Problem list 1. Coronary artery disease-status post CABG in 1996 2. Diabetes mellitus 3. Hypertension 4. Hyperlipidemia 5. Right  DVT.    [name] is an 85 yo female with a Hx of CAD, CABG ( November 09, 1994), diabetes mellitus, anxiety, HTN, hyperlipidemia.   She's been under a lot of stress recently. Her son died last year. She's under a lot of stress with the extra expenses involved such as his funeral. She presented with some CP in December.  A stress myoview revealed lateral ischemia.  We offered cardiac cath but she did not want to have the cath. She presents today without any particular complaints. She has not had any episodes of chest pain or shortness of breath since I saw her a month ago. She does have occasional episodes of tachycardia. She also notes that she has some anxiety. She was last seen in January after having an abnormal stress test that revealed anterior apical ischemia. She refused cardiac catheterization at that time. She has not had any recent episodes of chest pain.  She does not want to have a cardiac catheterization since she's feeling so well.  She has had some chest burning that is nearly constant.  I suggest several times that she should consider getting the cath done and she changed to subject.  September 04, 2012:  [name] is doing well from a cardiac standpoint.  She is still working - despite having lots of back pain.  No CP .  She had some CP several years ago and had a mildly abnormal myoview.  She did not want to have a cardiac cath at that time and has not had any significant epidoses of CP since that time.  She continues to be under lots of stress  She complains of back pain, stomach pain, burning in her legs.    Nov. 24, 2014:  Pt is doing OK from a cardiac standpoint.  Lots of stress - she has been  running the [employer] by herself for the past several months.  She is not getting enough rest.  Sleeping 4 1/2 hours at night - tries to get a nap for an hour.    September 09, 2013:  [name] is doing ok.  She is under lots of stress as usual.    Nov. 10, 2015:  [name] presents with various aches and pains.  She has a vericose vein in her leg that bled. She is on Xarelto for a right leg DVT on Sept. 30. .   CT angio a week later was negative for PE.   She has lots of anxiety.  Has lots of chest pain.  The pain is not pleuretic.  The pain is not associated with exercise.  She does not have any pain while she is working but more often occurs with stress.   She is living with 2 elderly men now and has lots of stressful issues relating to that.    This seems to be a major cause of her pain.  She does have 83 year old SVGs  September 23, 2014:  Doing well from a cardiac standpoint. Lots of anxiety .  Has a open right heel ulcer.   Jan. 9, 2017  [name] has been doing ok No CP.  No dyspnea Has had right ankle pain and swelling  Occasionally has faster than normal heart rate,  No associated dyspnea , CP or dizziness.  Has had an upper respiratory tract infection  Recently   October 19, 2015:  Has had a UTI since I last saw her.  Was in the hospital  Had new A-fib with RVR with the hospitalization  Has converted back to NSR .   Aug 03, 2016:  No serious cardiac symptoms.   Has occasional episodes of tachycardia   February 21, 2018:  Doing well from a cardiac standpoint .   Seen with Granddaughter in [place]. Has some palpitations.  Especially when she gets up too fast.    April 09, 2018:  Has redness and swelling of her right leg, Has an open woulnd,    Dropped a knife on her leg. Has finished her Abx treatment .      Current Outpatient Medications on File Prior to Visit  Medication Sig Dispense Refill  . acetaminophen (TYLENOL) 500 MG tablet Take 500 mg by mouth  every 6 (six) hours as needed for mild pain.    Marland Kitchen ALPRAZolam (XANAX) 1 MG tablet Take 0.5 mg by mouth 4 (four) times daily as needed for anxiety.     Marland Kitchen aspirin EC 81 MG tablet Take 81 mg by mouth daily.    . collagenase (SANTYL) ointment Apply topically daily. 15 g 0  . DEXILANT 30 MG capsule TAKE 1 CAPSULE (30 MG TOTAL) BY MOUTH DAILY. 30 capsule 11  . diclofenac sodium (VOLTAREN) 1 % GEL Apply 1 application topically 3 (three) times daily. 1 Tube 1  . famotidine (PEPCID) 20 MG tablet Take 1 tablet (20 mg total) by mouth at bedtime.    . furosemide (LASIX) 20 MG tablet TAKE 0.5 TABLETS (10 MG TOTAL) BY MOUTH DAILY. 45 tablet 3  . glucosamine-chondroitin 500-400 MG tablet Take 1 tablet by mouth daily.     Marland Kitchen glucose blood (ONE TOUCH ULTRA TEST) test strip USE AS INSTRUCTED TO CHECK BLOOD SUGAR ONCE A DAY Dx code: E11.9 50 each 5  . HYDROcodone-acetaminophen (NORCO/VICODIN) 5-325 MG tablet Take 1 tablet by mouth 3 (three) times daily as needed for moderate pain. 90 tablet 0  . loperamide (IMODIUM) 2 MG capsule Take 2 mg by mouth 4 (four) times daily as needed for diarrhea or loose stools.    Marland Kitchen loratadine (CLARITIN) 10 MG tablet Take 10 mg by mouth daily as needed for allergies.    . methocarbamol (ROBAXIN) 500 MG tablet Take 0.5-1 tablets (250-500 mg total) by mouth 3 (three) times daily as needed for muscle spasms. 20 tablet 0  . metoprolol tartrate (LOPRESSOR) 25 MG tablet TAKE ONE AND HALF TABLETS (37.5 MG TOTAL) BY MOUTH 2 (TWO) TIMES DAILY. 270 tablet 1  . Multiple Vitamin (MULTIVITAMIN) tablet Take 1 tablet by mouth daily. Patient reports takes a new vitamin to replace her centrum as recommended by her eye doctor.    . nitroGLYCERIN (NITROSTAT) 0.4 MG SL tablet Place 1 tablet (0.4 mg total) under the tongue every 5 (five) minutes as needed for chest pain. 25 tablet 3  . ONETOUCH VERIO test strip USE TO TEST BLOOD SUGAR ONCE DAILY 100 each 6  . pantoprazole (PROTONIX) 40 MG tablet Take 1 tablet  (40 mg total) by mouth daily. 30 tablet 3  . pioglitazone (ACTOS) 30 MG tablet TAKE 1 TABLET BY MOUTH EVERY DAY 90 tablet 3  . pravastatin (PRAVACHOL) 20 MG tablet TAKE 1 TABLET BY MOUTH EVERY DAY 90  tablet 3  . Probiotic Product (ALIGN) 4 MG CAPS Take 1 capsule (4 mg total) by mouth daily.    . ramipril (ALTACE) 5 MG capsule TAKE ONE CAPSULE BY MOUTH EVERYDAY FOR BLOOD PRESSURE 90 capsule 1  . sitaGLIPtin (JANUVIA) 100 MG tablet Take 1 tablet (100 mg total) by mouth daily. 30 tablet 2  . traZODone (DESYREL) 150 MG tablet Take 150 mg by mouth at bedtime.     . triamcinolone cream (KENALOG) 0.1 % APPLY 1 APPLICATION TOPICALLY 2 (TWO) TIMES DAILY.  1  . vitamin B-12 (CYANOCOBALAMIN) 1000 MCG tablet Take 1,000 mcg by mouth daily.    . vitamin E 400 UNIT capsule Take 400 Units by mouth daily.     No current facility-administered medications on file prior to visit.     Allergies  Allergen Reactions  . Metformin And Related Diarrhea    Past Medical History:  Diagnosis Date  . Acute deep vein thrombosis (DVT) of distal vein of right lower extremity ([place]) 12/2013   Acute thrombus of R proximal gastrocnemius vein. Symptomatic provoked distal DVT  . Anxiety   . Cellulitis of leg, left 01/16/2014  . Chronic back pain 03/2012   lumbar DDD with herniation - s/p L S1 transforaminal ESI (10/2012 Dr. Sharlet Salina)  . Chronic venous insufficiency   . Coronary artery disease    CABG 1998  . Cystocele   . Depression   . Diabetes mellitus    type 2, followed by endo.  . Diastolic dysfunction   . Diverticulosis   . Hemorrhoids   . History of heart attack   . Hx of echocardiogram    a. Echo 10/13: mild LVH, EF 55-60%, Gr 1 diast dysfn, PASP 32  . Hyperlipidemia   . Hypertension   . Irritable bowel syndrome   . Microcytic anemia   . PAD (peripheral artery disease) ([place]) 2013   ABI: R 0.91, L 0.83  . PUD (peptic ulcer disease)   . Varicose veins   . Venous ulcer of leg ([place]) 12/12/2013   Required  prolonged treatment by wound center  . Viral gastroenteritis due to [place] virus 05/13/2015   hospitalization    Past Surgical History:  Procedure Laterality Date  . ABDOMINAL HYSTERECTOMY  1975   TAH,BSO  . ABI  2013   R 0.91, L 0.83  . APPENDECTOMY     per pt report  . CARDIOVASCULAR STRESS TEST  2015   low risk myoview (Athel [name])  . CATARACT SURGERY    . CHOLECYSTECTOMY    . COLONOSCOPY  02/2010   extensive diverticulosis throughout, small internal hemorrhoids, rec rpt 5 yrs (Dr. Allyn Kenner)  . CORONARY ARTERY BYPASS GRAFT  1998  . dexa scan  09/2011   femur -0.8, forearm 0.6 => normal  . ENDOVENOUS ABLATION SAPHENOUS VEIN W/ LASER Left 03/2014   Kellie Simmering  . ESI  2014   L S1 transforaminal ESI x2 ([place]) without improvement  . HAND SURGERY    . lexiscan myoview  03/2011   Small basal inferolateral and anterolateral reversible perfusion defect suggests ischemia.  EF was normal.   old infarct, no new ischemia  . toe surgery    . TYMPANOPLASTY  1960s   R side  . Vault prolapse A & P repair  2009   Dr Rhodia [facility]    Social History   Tobacco Use  Smoking Status Former Smoker  . Last attempt to quit: 03/05/1979  . Years since quitting: 39.1  Smokeless Tobacco  Never Used    Social History   Substance and Sexual Activity  Alcohol Use No  . Alcohol/week: 0.0 standard drinks    Family History  Problem Relation Age of Onset  . Hypertension Mother   . Stroke Mother   . Diabetes Father   . Diabetes Sister   . Diabetes Brother   . Heart disease Son   . Colon cancer Neg Hx     Reviw of Systems:  Reviewed in the HPI.  All other systems are negative.  Physical Exam: Blood pressure 116/60, pulse 75, height 4\' 9"  (1.448 m), weight 142 lb (64.4 kg), SpO2 96 %.  GEN:   Elderly female, moderately obese, no acute distress HEENT: Normal NECK: No JVD; No carotid bruits LYMPHATICS: No lymphadenopathy CARDIAC: Rate S1-S2. RESPIRATORY:  Clear to auscultation  without rales, wheezing or rhonchi  ABDOMEN: Soft, non-tender, non-distended MUSCULOSKELETAL: Right lower leg is very erythematous and edematous.  She has a wound that is bandaged.  No significant edema in her left leg. SKIN: Warm and dry NEUROLOGIC:  Alert and oriented x 3     ECG: April 09, 2018. Normal sinus rhythm at a rate of 75.  Normal EKG.  Assessment / Plan:   1. Coronary artery disease-status post CABG in 1996 - No angina: Continue current medications.   2. Paroxysmal atrial fib : No evidence of atrial fibrillation.   3. Shortness of breath:         4. Hypertension -   blood pressures well controlled.   5. Hyperlipidemia   6. Right  DVT.  -   7.  Right lower leg wound.  The patient dropped a knife on her leg.  She was hospitalized for a week or so at [facility].  She received antibiotics.  She still has significant redness and swelling in may have some degree of cellulitis.  I have advised her to check back in with her primary medical doctor.  I have given her advice on getting a lounge doctor leg rest to help with leg elevation.   Mertie Moores, MD  04/09/2018 2:32 PM    [facility] [address],  [place], [place]  [address] Pager [phone] Phone: [phone]; Fax: [phone]

## 2018-04-09 NOTE — Patient Instructions (Addendum)
Medication Instructions:  Your physician recommends that you continue on your current medications as directed. Please refer to the Current Medication list given to you today.  If you need a refill on your cardiac medications before your next appointment, please call your pharmacy.   Lab work: None Ordered   Testing/Procedures: None Ordered   Follow-Up: At [facility], you and your health needs are our priority.  As part of our continuing mission to provide you with exceptional heart care, we have created designated Provider Care Teams.  These Care Teams include your primary Cardiologist (physician) and Advanced Practice Providers (APPs -  Physician Assistants and Nurse Practitioners) who all work together to provide you with the care you need, when you need it. You will need a follow up appointment in:  6 months.  Please call our office 2 months in advance to schedule this appointment.  You may see Mertie Moores, MD or one of the following Advanced Practice Providers on your designated Care Team: [name], PA-C [place], [place] . Daune Perch, NP   For your  leg edema you  should do  the following 1. Leg elevation - I recommend the Lounge Dr. Leg rest.  See below for details  2. Salt restriction  -  Use potassium chloride instead of regular salt as a salt substitute. 3. Walk regularly 4. Compression hose - [facility] 5. Weight loss    Available on [email or web address] Or  Go to [email or web address]

## 2018-04-10 ENCOUNTER — Encounter: Payer: Medicare Other | Attending: Physician Assistant | Admitting: Physician Assistant

## 2018-04-10 DIAGNOSIS — W548XXS Other contact with dog, sequela: Secondary | ICD-10-CM | POA: Insufficient documentation

## 2018-04-10 DIAGNOSIS — L03115 Cellulitis of right lower limb: Secondary | ICD-10-CM | POA: Diagnosis not present

## 2018-04-10 DIAGNOSIS — E11622 Type 2 diabetes mellitus with other skin ulcer: Secondary | ICD-10-CM | POA: Insufficient documentation

## 2018-04-10 DIAGNOSIS — L97812 Non-pressure chronic ulcer of other part of right lower leg with fat layer exposed: Secondary | ICD-10-CM | POA: Diagnosis not present

## 2018-04-10 DIAGNOSIS — I87331 Chronic venous hypertension (idiopathic) with ulcer and inflammation of right lower extremity: Secondary | ICD-10-CM | POA: Insufficient documentation

## 2018-04-10 DIAGNOSIS — S80811S Abrasion, right lower leg, sequela: Secondary | ICD-10-CM | POA: Diagnosis not present

## 2018-04-11 ENCOUNTER — Encounter: Payer: Self-pay | Admitting: Endocrinology

## 2018-04-11 ENCOUNTER — Ambulatory Visit (INDEPENDENT_AMBULATORY_CARE_PROVIDER_SITE_OTHER): Payer: Medicare Other | Admitting: Endocrinology

## 2018-04-11 VITALS — BP 110/60 | HR 80 | Ht <= 58 in | Wt 142.5 lb

## 2018-04-11 DIAGNOSIS — I2510 Atherosclerotic heart disease of native coronary artery without angina pectoris: Secondary | ICD-10-CM

## 2018-04-11 DIAGNOSIS — E1165 Type 2 diabetes mellitus with hyperglycemia: Secondary | ICD-10-CM

## 2018-04-11 LAB — POCT GLYCOSYLATED HEMOGLOBIN (HGB A1C): Hemoglobin A1C: 6.1 % — AB (ref 4.0–5.6)

## 2018-04-11 LAB — GLUCOSE, POCT (MANUAL RESULT ENTRY): POC Glucose: 86 mg/dl (ref 70–99)

## 2018-04-11 NOTE — Progress Notes (Signed)
AMBER, WILLIARD ([id number]) Visit Report for 04/10/2018 Chief Complaint Document Details Patient Name: [name], [name] Date of Service: 04/10/2018 3:15 PM Medical Record Number: [id number] Patient Account Number: [id number] Date of Birth/Sex: July 25, 1927 (85 y.o. F) Treating RN: Montey Hora Primary Care Provider: Ria Bush Other Clinician: Referring Provider: Ria Bush Treating Provider/Extender: Melburn Hake, HOYT Weeks in Treatment: 2 Information Obtained from: Patient Chief Complaint RLE wounds Electronic Signature(s) Signed: 04/11/2018 8:35:34 AM By: Worthy Keeler PA-C Entered By: Worthy Keeler on 04/10/2018 15:37:03 Albor, [name] ([id number]) -------------------------------------------------------------------------------- Debridement Details Patient Name: Rigor, Yukie C. Date of Service: 04/10/2018 3:15 PM Medical Record Number: [id number] Patient Account Number: [id number] Date of Birth/Sex: 10-21-1927 (85 y.o. F) Treating RN: Montey Hora Primary Care Provider: Ria Bush Other Clinician: Referring Provider: Ria Bush Treating Provider/Extender: Melburn Hake, HOYT Weeks in Treatment: 2 Debridement Performed for Wound #8 Right,Lateral Lower Leg Assessment: Performed By: Clinician Montey Hora, RN Debridement Type: Chemical/Enzymatic/Mechanical Agent Used: Santyl Severity of Tissue Pre Fat layer exposed Debridement: Level of Consciousness (Pre- Awake and Alert procedure): Pre-procedure Verification/Time Yes - 16:16 Out Taken: Start Time: 16:16 Pain Control: Lidocaine 4% Topical Solution Instrument: Other : tongue blade Bleeding: None End Time: 16:17 Procedural Pain: 0 Post Procedural Pain: 0 Response to Treatment: Procedure was tolerated well Level of Consciousness Awake and Alert (Post-procedure): Post Debridement Measurements of Total Wound Length: (cm) 3.4 Width: (cm) 2 Depth: (cm) 0.3 Volume: (cm)  1.602 Character of Wound/Ulcer Post Debridement: Improved Severity of Tissue Post Debridement: Fat layer exposed Post Procedure Diagnosis Same as Pre-procedure Electronic Signature(s) Signed: 04/10/2018 4:56:07 PM By: Montey Hora Signed: 04/11/2018 8:35:34 AM By: Worthy Keeler PA-C Entered By: Montey Hora on 04/10/2018 16:15:32 Pfannenstiel, Shalay C. ([id number]) -------------------------------------------------------------------------------- HPI Details Patient Name: Culley, Casha C. Date of Service: 04/10/2018 3:15 PM Medical Record Number: [id number] Patient Account Number: [id number] Date of Birth/Sex: 1928-03-31 (85 y.o. F) Treating RN: Montey Hora Primary Care Provider: Ria Bush Other Clinician: Referring Provider: Ria Bush Treating Provider/Extender: Melburn Hake, HOYT Weeks in Treatment: 2 History of Present Illness HPI Description: Readmission: 03/23/18 on evaluation today patient presents for readmission entire clinic concerning issues that she has been having for the past couple of weeks in regard to her right lower extremity. The medial injury actually occurred when her dog tried to take a blanket from her and subsequently scratched her with his toenail. Subsequently the lateral injury was a wound where she dropped a knife in her kitchen and hit the side of her leg. With that being said both have been open for some time and home health nurse that has been coming out marked for her where the redness was several days ago. Nonetheless this has spread will be on the margin of what was marked. She was placed on Keflex which was started two days ago but at this point it does not seem to have helped with any improvement in the overall appearance of the wounds. No fevers, chills, nausea, or vomiting noted at this time. 04/02/18 Seen today for follow up and management of right lower extremity wounds. Right lateral wound with adherent slough. She is complaining of pain  to touch which limited exam. Recently treated in the ED for cellulitis and wound culture positive for MRSA. Completed a dose of Vancomycin and transitioned to doxycycline by mouth. Erythema remains present with improvement since last visit. Right medial wound with scab present. No fevers, chills, nausea, or vomiting noted at this time. 04/10/18 on evaluation today patient  appears to be doing a Hetz better in regard to her right lateral lower [name]. The slough does seem to be coming off although it is slow from the wound bed. Fortunately there does not appear to be any evidence of infection at this time which is good news. With that being said I do believe that she is going to require longer treatment with the Santyl simply due to the fact that I'm not able to really debride the wound due to the pain that she is experiencing. Electronic Signature(s) Signed: 04/11/2018 8:35:34 AM By: Worthy Keeler PA-C Entered By: Worthy Keeler on 04/10/2018 16:52:56 Bare, [name] ([id number]) -------------------------------------------------------------------------------- Physical Exam Details Patient Name: Pippins, Rihanna C. Date of Service: 04/10/2018 3:15 PM Medical Record Number: [id number] Patient Account Number: [id number] Date of Birth/Sex: 1927-11-16 (85 y.o. F) Treating RN: Montey Hora Primary Care Provider: Ria Bush Other Clinician: Referring Provider: Ria Bush Treating Provider/Extender: STONE III, HOYT Weeks in Treatment: 2 Constitutional Well-nourished and well-hydrated in no acute distress. Respiratory normal breathing without difficulty. clear to auscultation bilaterally. Cardiovascular regular rate and rhythm with normal S1, S2. Psychiatric this patient is able to make decisions and demonstrates good insight into disease process. Alert and Oriented x 3. pleasant and cooperative. Notes Even with just light touch to the wound bed she complained of  significant discomfort. For that reason I really was not able to debride the wound today. Nonetheless this is gonna mean that it will take longer for the wound to heal although I think it still will not likely heal it will just take time. Fortunately there's no signs of infection at this time. Electronic Signature(s) Signed: 04/11/2018 8:35:34 AM By: Worthy Keeler PA-C Entered By: Worthy Keeler on 04/10/2018 16:53:28 Mulvey, [name] ([id number]) -------------------------------------------------------------------------------- Physician Orders Details Patient Name: Kok, Yasha C. Date of Service: 04/10/2018 3:15 PM Medical Record Number: [id number] Patient Account Number: [id number] Date of Birth/Sex: [birth year] (85 y.o. F) Treating RN: Montey Hora Primary Care Provider: Ria Bush Other Clinician: Referring Provider: Ria Bush Treating Provider/Extender: Melburn Hake, HOYT Weeks in Treatment: 2 Verbal / Phone Orders: No Diagnosis Coding ICD-10 Coding Code Description I87.331 Chronic venous hypertension (idiopathic) with ulcer and inflammation of right lower extremity E11.622 Type 2 diabetes mellitus with other skin ulcer L97.812 Non-pressure chronic ulcer of other part of right lower leg with fat layer exposed L03.115 Cellulitis of right lower limb Wound Cleansing Wound #8 Right,Lateral Lower Leg o Clean wound with Normal Saline. Primary Wound Dressing Wound #8 Right,Lateral Lower Leg o Santyl Ointment Secondary Dressing Wound #8 Right,Lateral Lower Leg o ABD and Kerlix/Conform - secured with netting Dressing Change Frequency Wound #8 Right,Lateral Lower Leg o Change dressing every day. Follow-up Appointments Wound #8 Right,Lateral Lower Leg o Return Appointment in 1 week. Edema Control Wound #8 Right,Lateral Lower Leg o Elevate legs to the level of the heart and pump ankles as often as possible Home Health Wound #8 Right,Lateral Lower  Leg o [place] Nurse may visit PRN to address patientos wound care needs. o FACE TO FACE ENCOUNTER: MEDICARE and MEDICAID PATIENTS: I certify that this patient is under my care and that I had a face-to-face encounter that meets the physician face-to-face encounter requirements with this patient on this date. The encounter with the patient was in whole or in part for the following MEDICAL CONDITION: (primary reason for [place]) MEDICAL NECESSITY: I certify, that based on my findings, NURSING services are a  medically necessary home health service. HOME BOUND STATUS: I certify that my clinical findings support that this patient is homebound (i.e., Due to illness or injury, pt requires aid of Swearingin, Desiray C. ([id number]) supportive devices such as crutches, cane, wheelchairs, walkers, the use of special transportation or the assistance of another person to leave their place of residence. There is a normal inability to leave the home and doing so requires considerable and taxing effort. Other absences are for medical reasons / religious services and are infrequent or of short duration when for other reasons). o If current dressing causes regression in wound condition, may D/C ordered dressing product/s and apply Normal Saline Moist Dressing daily until next [place] / Other MD appointment. [place] of regression in wound condition at [phone]. o Please direct any NON-WOUND related issues/requests for orders to patient's Primary Care Physician Medications-please add to medication list. Wound #8 Right,Lateral Lower Leg o Santyl Enzymatic Ointment Electronic Signature(s) Signed: 04/10/2018 4:56:07 PM By: Montey Hora Signed: 04/11/2018 8:35:34 AM By: Worthy Keeler PA-C Entered By: Montey Hora on 04/10/2018 16:20:06 Fecher, Tiffancy C.  ([id number]) -------------------------------------------------------------------------------- Problem List Details Patient Name: Guettler, Jolinda C. Date of Service: 04/10/2018 3:15 PM Medical Record Number: [id number] Patient Account Number: [id number] Date of Birth/Sex: 04/18/1927 (85 y.o. F) Treating RN: Montey Hora Primary Care Provider: Ria Bush Other Clinician: Referring Provider: Ria Bush Treating Provider/Extender: Melburn Hake, HOYT Weeks in Treatment: 2 Active Problems ICD-10 Evaluated Encounter Code Description Active Date Today Diagnosis I87.331 Chronic venous hypertension (idiopathic) with ulcer and 03/23/2018 No Yes inflammation of right lower extremity E11.622 Type 2 diabetes mellitus with other skin ulcer 03/23/2018 No Yes L97.812 Non-pressure chronic ulcer of other part of right lower leg 03/23/2018 No Yes with fat layer exposed L03.115 Cellulitis of right lower limb 03/23/2018 No Yes Inactive Problems Resolved Problems Electronic Signature(s) Signed: 04/11/2018 8:35:34 AM By: Worthy Keeler PA-C Entered By: Worthy Keeler on 04/10/2018 15:36:52 Forehand, [name] ([id number]) -------------------------------------------------------------------------------- Progress Note Details Patient Name: Bangert, Kinlie C. Date of Service: 04/10/2018 3:15 PM Medical Record Number: [id number] Patient Account Number: [id number] Date of Birth/Sex: Sep 09, 1927 (85 y.o. F) Treating RN: Montey Hora Primary Care Provider: Ria Bush Other Clinician: Referring Provider: Ria Bush Treating Provider/Extender: Melburn Hake, HOYT Weeks in Treatment: 2 Subjective Chief Complaint Information obtained from Patient RLE wounds History of Present Illness (HPI) Readmission: 03/23/18 on evaluation today patient presents for readmission entire clinic concerning issues that she has been having for the past couple of weeks in regard to her right lower extremity. The  medial injury actually occurred when her dog tried to take a blanket from her and subsequently scratched her with his toenail. Subsequently the lateral injury was a wound where she dropped a knife in her kitchen and hit the side of her leg. With that being said both have been open for some time and home health nurse that has been coming out marked for her where the redness was several days ago. Nonetheless this has spread will be on the margin of what was marked. She was placed on Keflex which was started two days ago but at this point it does not seem to have helped with any improvement in the overall appearance of the wounds. No fevers, chills, nausea, or vomiting noted at this time. 04/02/18 Seen today for follow up and management of right lower extremity wounds. Right lateral wound with adherent slough. She is complaining of pain to touch which limited exam.  Recently treated in the ED for cellulitis and wound culture positive for MRSA. Completed a dose of Vancomycin and transitioned to doxycycline by mouth. Erythema remains present with improvement since last visit. Right medial wound with scab present. No fevers, chills, nausea, or vomiting noted at this time. 04/10/18 on evaluation today patient appears to be doing a Oquin better in regard to her right lateral lower [name]. The slough does seem to be coming off although it is slow from the wound bed. Fortunately there does not appear to be any evidence of infection at this time which is good news. With that being said I do believe that she is going to require longer treatment with the Santyl simply due to the fact that I'm not able to really debride the wound due to the pain that she is experiencing. Patient History Information obtained from Patient. Family History Diabetes - Father,Siblings,Child, Hypertension - Child, Lung Disease - Child, Stroke - Mother, No family history of Cancer, Heart Disease, Kidney Disease, Seizures,  Thyroid Problems. Social History Former smoker, Marital Status - Single, Alcohol Use - Never, Drug Use - No History, Caffeine Use - Daily. Medical And Surgical History Notes Constitutional Symptoms (General Health) High Blood Pressure, open heart surgery, gall stone, hysterectomy; Fluid pill, Anxiety Review of Systems (ROS) Constitutional Symptoms (General Health) Denies complaints or symptoms of Fever, Chills. Hagedorn, Codie C. ([id number]) Respiratory The patient has no complaints or symptoms. Cardiovascular The patient has no complaints or symptoms. Psychiatric The patient has no complaints or symptoms. Objective Constitutional Well-nourished and well-hydrated in no acute distress. Vitals Time Taken: 3:38 PM, Temperature: 97.7 F, Pulse: 71 bpm, Respiratory Rate: 18 breaths/min, Blood Pressure: 126/90 mmHg. Respiratory normal breathing without difficulty. clear to auscultation bilaterally. Cardiovascular regular rate and rhythm with normal S1, S2. Psychiatric this patient is able to make decisions and demonstrates good insight into disease process. Alert and Oriented x 3. pleasant and cooperative. General Notes: Even with just light touch to the wound bed she complained of significant discomfort. For that reason I really was not able to debride the wound today. Nonetheless this is gonna mean that it will take longer for the wound to heal although I think it still will not likely heal it will just take time. Fortunately there's no signs of infection at this time. Integumentary (Hair, Skin) Wound #7 status is Healed - Epithelialized. Original cause of wound was Trauma. The wound is located on the Right,Medial Lower Leg. The wound measures 0cm length x 0cm width x 0cm depth; 0cm^2 area and 0cm^3 volume. Wound #8 status is Open. Original cause of wound was Trauma. The wound is located on the Right,Lateral Lower Leg. The wound measures 3.4cm length x 2cm width x 0.3cm depth;  5.341cm^2 area and 1.602cm^3 volume. There is Fat Layer (Subcutaneous Tissue) Exposed exposed. There is no tunneling or undermining noted. There is a large amount of serous drainage noted. The wound margin is flat and intact. There is no granulation within the wound bed. There is a large (67-100%) amount of necrotic tissue within the wound bed including Adherent Slough. The periwound skin appearance exhibited: Dry/Scaly, Erythema. The periwound skin appearance did not exhibit: Callus, Crepitus, Excoriation, Induration, Rash, Scarring, Maceration, Atrophie Blanche, Cyanosis, Ecchymosis, Hemosiderin Staining, Mottled, Pallor, Rubor. The surrounding wound skin color is noted with erythema which is circumferential. Periwound temperature was noted as No Abnormality. The periwound has tenderness on palpation. Assessment Tenpas, Myrla C. ([id number]) Active Problems ICD-10 Chronic venous hypertension (idiopathic) with ulcer  and inflammation of right lower extremity Type 2 diabetes mellitus with other skin ulcer Non-pressure chronic ulcer of other part of right lower leg with fat layer exposed Cellulitis of right lower limb Procedures Wound #8 Pre-procedure diagnosis of Wound #8 is a Diabetic Wound/Ulcer of the Lower Extremity located on the Right,Lateral Lower Leg .Severity of Tissue Pre Debridement is: Fat layer exposed. There was a Chemical/Enzymatic/Mechanical debridement performed by Montey Hora, RN. With the following instrument(s): tongue blade after achieving pain control using Lidocaine 4% Topical Solution. Agent used was [employer]. A time out was conducted at 16:16, prior to the start of the procedure. There was no bleeding. The procedure was tolerated well with a pain level of 0 throughout and a pain level of 0 following the procedure. Post Debridement Measurements: 3.4cm length x 2cm width x 0.3cm depth; 1.602cm^3 volume. Character of Wound/Ulcer Post Debridement is improved. Severity of  Tissue Post Debridement is: Fat layer exposed. Post procedure Diagnosis Wound #8: Same as Pre-Procedure Plan Wound Cleansing: Wound #8 Right,Lateral Lower Leg: Clean wound with Normal Saline. Primary Wound Dressing: Wound #8 Right,Lateral Lower Leg: Santyl Ointment Secondary Dressing: Wound #8 Right,Lateral Lower Leg: ABD and Kerlix/Conform - secured with netting Dressing Change Frequency: Wound #8 Right,Lateral Lower Leg: Change dressing every day. Follow-up Appointments: Wound #8 Right,Lateral Lower Leg: Return Appointment in 1 week. Edema Control: Wound #8 Right,Lateral Lower Leg: Elevate legs to the level of the heart and pump ankles as often as possible Home Health: Wound #8 Right,Lateral Lower Leg: [place] Nurse may visit PRN to address patient s wound care needs. FACE TO FACE ENCOUNTER: MEDICARE and MEDICAID PATIENTS: I certify that this patient is under my care and that I had a face-to-face encounter that meets the physician face-to-face encounter requirements with this patient on this date. The encounter with the patient was in whole or in part for the following MEDICAL CONDITION: (primary reason for [place]) MEDICAL NECESSITY: I certify, that based on my findings, NURSING services are a medically necessary home Bortner, Marjo C. ([id number]) health service. HOME BOUND STATUS: I certify that my clinical findings support that this patient is homebound (i.e., Due to illness or injury, pt requires aid of supportive devices such as crutches, cane, wheelchairs, walkers, the use of special transportation or the assistance of another person to leave their place of residence. There is a normal inability to leave the home and doing so requires considerable and taxing effort. Other absences are for medical reasons / religious services and are infrequent or of short duration when for other reasons). If current dressing causes regression in  wound condition, may D/C ordered dressing product/s and apply Normal Saline Moist Dressing daily until next [place] / Other MD appointment. [place] of regression in wound condition at [phone]. Please direct any NON-WOUND related issues/requests for orders to patient's Primary Care Physician Medications-please add to medication list.: Wound #8 Right,Lateral Lower Leg: Santyl Enzymatic Ointment We will continue with the above wound care measures for the next week. She's in agreement this plan. If anything changes or worsens the meantime shall contact the office and let me know. Please see above for specific wound care orders. We will see patient for re-evaluation in 1 week(s) here in the clinic. If anything worsens or changes patient will contact our office for additional recommendations. Electronic Signature(s) Signed: 04/11/2018 8:35:34 AM By: Worthy Keeler PA-C Entered By: Worthy Keeler on 04/10/2018 16:53:46 Urista, Yazlin C. (  [id number]) -------------------------------------------------------------------------------- ROS/PFSH Details Patient Name: Clemence, Phiona C. Date of Service: 04/10/2018 3:15 PM Medical Record Number: [id number] Patient Account Number: [id number] Date of Birth/Sex: [birth year] (85 y.o. F) Treating RN: Montey Hora Primary Care Provider: Ria Bush Other Clinician: Referring Provider: Ria Bush Treating Provider/Extender: Melburn Hake, HOYT Weeks in Treatment: 2 Information Obtained From Patient Wound History Constitutional Symptoms (General Health) Complaints and Symptoms: Negative for: Fever; Chills Medical History: Past Medical History Notes: High Blood Pressure, open heart surgery, gall stone, hysterectomy; Fluid pill, Anxiety Eyes Medical History: Positive for: Cataracts - removed Negative for: Glaucoma; Optic Neuritis Hematologic/Lymphatic Medical History: Positive for: Anemia Negative for:  Hemophilia; Human Immunodeficiency Virus; Lymphedema; Sickle Cell Disease Respiratory Complaints and Symptoms: No Complaints or Symptoms Medical History: Negative for: Aspiration; Asthma; Chronic Obstructive Pulmonary Disease (COPD); Pneumothorax; Sleep Apnea; Tuberculosis Cardiovascular Complaints and Symptoms: No Complaints or Symptoms Medical History: Positive for: Arrhythmia; Coronary Artery Disease; Hypertension; Myocardial Infarction Negative for: Angina; Congestive Heart Failure; Deep Vein Thrombosis; Hypotension; Peripheral Arterial Disease; Peripheral Venous Disease; Phlebitis Endocrine Medical History: Positive for: Type II Diabetes Negative for: Type I Diabetes Hapke, Kaleyah C. ([id number]) Time with diabetes: no idea Treated with: Oral agents Blood sugar tested every day: No Genitourinary Medical History: Negative for: End Stage Renal Disease Immunological Medical History: Negative for: Lupus Erythematosus; Raynaudos; Scleroderma Integumentary (Skin) Medical History: Negative for: History of Burn; History of pressure wounds Musculoskeletal Medical History: Positive for: Osteoarthritis Negative for: Gout; Rheumatoid Arthritis; Osteomyelitis Neurologic Medical History: Negative for: Dementia; Neuropathy; Quadriplegia; Paraplegia; Seizure Disorder Oncologic Medical History: Negative for: Received Chemotherapy; Received Radiation Psychiatric Complaints and Symptoms: No Complaints or Symptoms HBO Extended History Items Eyes: Cataracts Immunizations Pneumococcal Vaccine: Received Pneumococcal Vaccination: Yes Implantable Devices Family and Social History Cancer: No; Diabetes: Yes - Father,Siblings,Child; Heart Disease: No; Hypertension: Yes - Child; Kidney Disease: No; Lung Disease: Yes - Child; Seizures: No; Stroke: Yes - Mother; Thyroid Problems: No; Former smoker; Marital Status - Single; Alcohol Use: Never; Drug Use: No History; Caffeine Use: Daily;  Financial Concerns: No; Food, Clothing or Shelter Needs: No; Support System Lacking: No; Transportation Concerns: No; Advanced Directives: Yes (Not Provided); Patient does not want information on Advanced Directives; Living Will: Yes (Not Provided) Physician Affirmation I have reviewed and agree with the above information. ELKY, FUNCHES ([id number]) Electronic Signature(s) Signed: 04/10/2018 4:56:07 PM By: Montey Hora Signed: 04/11/2018 8:35:34 AM By: Worthy Keeler PA-C Entered By: Worthy Keeler on 04/10/2018 16:53:11 Kroenke, [name] ([id number]) -------------------------------------------------------------------------------- SuperBill Details Patient Name: Thomure, Rendi C. Date of Service: 04/10/2018 Medical Record Number: [id number] Patient Account Number: [id number] Date of Birth/Sex: [birth year] (85 y.o. F) Treating RN: Montey Hora Primary Care Provider: Ria Bush Other Clinician: Referring Provider: Ria Bush Treating Provider/Extender: Melburn Hake, HOYT Weeks in Treatment: 2 Diagnosis Coding ICD-10 Codes Code Description I87.331 Chronic venous hypertension (idiopathic) with ulcer and inflammation of right lower extremity E11.622 Type 2 diabetes mellitus with other skin ulcer L97.812 Non-pressure chronic ulcer of other part of right lower leg with fat layer exposed L03.115 Cellulitis of right lower limb Facility Procedures CPT4 Code: [id number] Description: 40973 - DEBRIDE W/O ANES NON SELECT Modifier: Quantity: 1 Physician Procedures CPT4: Description Modifier Quantity Code 5329924 [id number] - WC PHYS LEVEL 4 - EST PT 1 ICD-10 Diagnosis Description I87.331 Chronic venous hypertension (idiopathic) with ulcer and inflammation of right lower extremity E11.622 Type 2 diabetes mellitus with  other skin ulcer L97.812 Non-pressure chronic ulcer of other part of right lower leg with fat layer  exposed L03.115 Cellulitis of right lower limb Electronic  Signature(s) Signed: 04/11/2018 8:35:34 AM By: Worthy Keeler PA-C Entered By: Worthy Keeler on 04/10/2018 16:54:02

## 2018-04-11 NOTE — Progress Notes (Signed)
[name], female   DOB: [birth year], 85 y.o.   MRN: [id number]   Reason for Appointment: Diabetes follow-up   History of Present Illness   Diagnosis: Type 2 DIABETES MELITUS, date of diagnosis: 1991   She has had mild diabetes for several years and has been on Actos only since about 2001 Her A1c has generally been around or under 7% She was given a trial of metformin because of her difficulty with weight loss and also edema but she felt that it made her blood sugars go up and she will not try it again Also she thinks her blood sugars were higher with a trial of Januvia  RECENT history:  A1c has been usually below 7% and in 10/19 was higher at 7 A1c is now improved at 6.1  She is currently taking Actos 30 mg daily since 01/2014 and Januvia 50 mg daily  Current blood sugar patterns, day-to-day management and problems identified:   She was started on Januvia in 10/19 because of her relatively high readings partly related to getting prednisone and readings as high as 282  Although she is taking the medication she claims that her blood sugar at one point had gone down to 63 and for at least a couple of months has taken only half a tablet of Januvia  Today she did not bring her monitor  She still thinks that she has an occasional reading over 200 not clear from what kind of food  Usually she thinks blood sugars are not high  She has been hospitalized last month and no changes were made in her medications  Has had no other side effects from Januvia  She still will sometimes eat cereal and this may be causing high readings at times       Oral hypoglycemic drugs: Actos 30 mg daily        Side effects from medications: None  Monitors blood glucose: ?  Frequency .    Glucometer: One Touch.           Blood Glucose readings at home range from 63-237 by recall          Meals: 2  meals per day usually Physical activity:  minimal            Dietician  visit: Most recent: Several years ago             Wt Readings from Last 3 Encounters:  04/11/18 142 lb 8 oz (64.6 kg)  04/09/18 142 lb (64.4 kg)  03/30/18 142 lb (64.4 kg)   Lab Results  Component Value Date   HGBA1C 6.1 (A) 04/11/2018   HGBA1C 7.0 (A) 01/15/2018   HGBA1C 6.5 07/26/2017   Lab Results  Component Value Date   MICROALBUR <0.7 07/26/2017   LDLCALC 56 01/24/2018   CREATININE 0.79 03/25/2018     Allergies as of 04/11/2018      Reactions   Metformin And Related Diarrhea      Medication List       Accurate as of April 11, 2018  2:03 PM. Always use your most recent med list.        acetaminophen 500 MG tablet Commonly known as:  TYLENOL Take 500 mg by mouth every 6 (six) hours as needed for mild pain.   ALIGN 4 MG Caps Take 1 capsule (4 mg total) by mouth daily.   ALPRAZolam 1 MG tablet Commonly known as:  XANAX Take 0.5 mg by  mouth 4 (four) times daily as needed for anxiety.   aspirin EC 81 MG tablet Take 81 mg by mouth daily.   collagenase ointment Commonly known as:  SANTYL Apply topically daily.   DEXILANT 30 MG capsule Generic drug:  Dexlansoprazole TAKE 1 CAPSULE (30 MG TOTAL) BY MOUTH DAILY.   diclofenac sodium 1 % Gel Commonly known as:  VOLTAREN Apply 1 application topically 3 (three) times daily.   famotidine 20 MG tablet Commonly known as:  PEPCID Take 1 tablet (20 mg total) by mouth at bedtime.   furosemide 20 MG tablet Commonly known as:  LASIX TAKE 0.5 TABLETS (10 MG TOTAL) BY MOUTH DAILY.   glucosamine-chondroitin 500-400 MG tablet Take 1 tablet by mouth daily.   glucose blood test strip Commonly known as:  ONE TOUCH ULTRA TEST USE AS INSTRUCTED TO CHECK BLOOD SUGAR ONCE A DAY Dx code: E11.9   ONETOUCH VERIO test strip Generic drug:  glucose blood USE TO TEST BLOOD SUGAR ONCE DAILY   HYDROcodone-acetaminophen 5-325 MG tablet Commonly known as:  NORCO/VICODIN Take 1 tablet by mouth 3 (three) times daily as needed  for moderate pain.   loperamide 2 MG capsule Commonly known as:  IMODIUM Take 2 mg by mouth 4 (four) times daily as needed for diarrhea or loose stools.   loratadine 10 MG tablet Commonly known as:  CLARITIN Take 10 mg by mouth daily as needed for allergies.   methocarbamol 500 MG tablet Commonly known as:  ROBAXIN Take 0.5-1 tablets (250-500 mg total) by mouth 3 (three) times daily as needed for muscle spasms.   metoprolol tartrate 25 MG tablet Commonly known as:  LOPRESSOR TAKE ONE AND HALF TABLETS (37.5 MG TOTAL) BY MOUTH 2 (TWO) TIMES DAILY.   multivitamin tablet Take 1 tablet by mouth daily. Patient reports takes a new vitamin to replace her centrum as recommended by her eye doctor.   nitroGLYCERIN 0.4 MG SL tablet Commonly known as:  NITROSTAT Place 1 tablet (0.4 mg total) under the tongue every 5 (five) minutes as needed for chest pain.   nystatin-triamcinolone ointment Commonly known as:  MYCOLOG APPLY 1 APPLICATION TOPICALLY 2 (TWO) TIMES DAILY. AS NEEDED FOR ITCHING   pantoprazole 40 MG tablet Commonly known as:  PROTONIX Take 1 tablet (40 mg total) by mouth daily.   pioglitazone 30 MG tablet Commonly known as:  ACTOS TAKE 1 TABLET BY MOUTH EVERY DAY   pravastatin 20 MG tablet Commonly known as:  PRAVACHOL TAKE 1 TABLET BY MOUTH EVERY DAY   ramipril 5 MG capsule Commonly known as:  ALTACE TAKE ONE CAPSULE BY MOUTH EVERYDAY FOR BLOOD PRESSURE   sitaGLIPtin 100 MG tablet Commonly known as:  JANUVIA Take 1 tablet (100 mg total) by mouth daily.   traZODone 150 MG tablet Commonly known as:  DESYREL Take 150 mg by mouth at bedtime.   triamcinolone cream 0.1 % Commonly known as:  KENALOG APPLY 1 APPLICATION TOPICALLY 2 (TWO) TIMES DAILY.   vitamin B-12 1000 MCG tablet Commonly known as:  CYANOCOBALAMIN Take 1,000 mcg by mouth daily.   vitamin E 400 UNIT capsule Take 400 Units by mouth daily.       Allergies:  Allergies  Allergen Reactions  .  Metformin And Related Diarrhea    Past Medical History:  Diagnosis Date  . Acute deep vein thrombosis (DVT) of distal vein of right lower extremity ([place]) 12/2013   Acute thrombus of R proximal gastrocnemius vein. Symptomatic provoked distal DVT  . Anxiety   .  Cellulitis of leg, left 01/16/2014  . Chronic back pain 03/2012   lumbar DDD with herniation - s/p L S1 transforaminal ESI (10/2012 Dr. Sharlet Salina)  . Chronic venous insufficiency   . Coronary artery disease    CABG 1998  . Cystocele   . Depression   . Diabetes mellitus    type 2, followed by endo.  . Diastolic dysfunction   . Diverticulosis   . Hemorrhoids   . History of heart attack   . Hx of echocardiogram    a. Echo 10/13: mild LVH, EF 55-60%, Gr 1 diast dysfn, PASP 32  . Hyperlipidemia   . Hypertension   . Irritable bowel syndrome   . Microcytic anemia   . PAD (peripheral artery disease) ([place]) 2013   ABI: R 0.91, L 0.83  . PUD (peptic ulcer disease)   . Varicose veins   . Venous ulcer of leg ([place]) 12/12/2013   Required prolonged treatment by wound center  . Viral gastroenteritis due to [place] virus 05/13/2015   hospitalization    Past Surgical History:  Procedure Laterality Date  . ABDOMINAL HYSTERECTOMY  1975   TAH,BSO  . ABI  2013   R 0.91, L 0.83  . APPENDECTOMY     per pt report  . CARDIOVASCULAR STRESS TEST  2015   low risk myoview ([facility])  . CATARACT SURGERY    . CHOLECYSTECTOMY    . COLONOSCOPY  02/2010   extensive diverticulosis throughout, small internal hemorrhoids, rec rpt 5 yrs (Dr. Allyn Kenner)  . CORONARY ARTERY BYPASS GRAFT  1998  . dexa scan  09/2011   femur -0.8, forearm 0.6 => normal  . ENDOVENOUS ABLATION SAPHENOUS VEIN W/ LASER Left 03/2014   Kellie Simmering  . ESI  2014   L S1 transforaminal ESI x2 ([facility]) without improvement  . HAND SURGERY    . lexiscan myoview  03/2011   Small basal inferolateral and anterolateral reversible perfusion defect suggests ischemia.  EF was normal.   old infarct,  no new ischemia  . toe surgery    . TYMPANOPLASTY  1960s   R side  . Vault prolapse A & P repair  2009   Dr Rhodia Albright [facility] hospital    Family History  Problem Relation Age of Onset  . Hypertension Mother   . Stroke Mother   . Diabetes Father   . Diabetes Sister   . Diabetes Brother   . Heart disease Son   . Colon cancer Neg Hx     Social History:  reports that she quit smoking about 39 years ago. She has never used smokeless tobacco. She reports that she does not drink alcohol or use drugs.  Review of Systems:  No history of thyroid disease  Lab Results  Component Value Date   TSH 2.41 10/26/2016   TSH 2.71 12/30/2015   TSH 1.82 10/31/2014   [name]4 0.91 10/26/2016     HYPERTENSION:  well-controlled on Ramipril 5 mg   HYPERLIPIDEMIA: The lipid abnormality consists of elevated LDL treated with pravastatin To be followed by cardiologist and PCP  Lab Results  Component Value Date   CHOL 124 01/24/2018   HDL 53.80 01/24/2018   LDLCALC 56 01/24/2018   TRIG 69.0 01/24/2018   CHOLHDL 2 01/24/2018    Last foot exam in 1/20      Examination:   BP 110/60 (BP Location: Left Arm, Patient Position: Sitting, Cuff Size: Normal)   Pulse 80   Ht 4\' 9"  (1.448 m)   Wt  142 lb 8 oz (64.6 kg)   SpO2 98%   BMI 30.84 kg/m   Body mass index is 30.84 kg/m.   Diabetic Foot Exam - Simple   Simple Foot Form Diabetic Foot exam was performed with the following findings:  Yes 04/11/2018  2:00 PM  Visual Inspection See comments:  Yes Sensation Testing Intact to touch and monofilament testing bilaterally:  Yes Pulse Check See comments:  Yes Comments Right foot not checked because of dressing Left foot has lateral deviation of the first toe Absent pedal pulses    She has edema of the right lower leg with erythema, most of the lower leg is currently bandaged  ASSESSMENT/ PLAN:   DIABETES type 2 with obesity  A1c is much better at 6.1  Although previously her blood sugars  were currently increased from steroids she has better control with adding Januvia to her Actos Unable to review her blood sugars and she did not bring her meter today  Mostly has high readings if she is eating more carbohydrates like cereal  Although she thinks she gets low blood sugars with 100 mg Januvia and only wants to take a half a pill this appears to be adequate Also appears to have stabilized her blood sugars better than Actos alone She can continue Actos as she does not have any history of CHF or fluid retention Has had normal renal function Discussed blood sugar targets at fasting or after meals  Cellulitis of right leg: She will continue to follow-up with PCP   There are no Patient Instructions on file for this visit.   Elayne Snare 04/11/2018, 2:03 PM      Note: This office note was prepared with Dragon voice recognition system technology. Any transcriptional errors that result from this process are unintentional.

## 2018-04-12 DIAGNOSIS — I2510 Atherosclerotic heart disease of native coronary artery without angina pectoris: Secondary | ICD-10-CM | POA: Diagnosis not present

## 2018-04-12 DIAGNOSIS — L97211 Non-pressure chronic ulcer of right calf limited to breakdown of skin: Secondary | ICD-10-CM | POA: Diagnosis not present

## 2018-04-12 DIAGNOSIS — I872 Venous insufficiency (chronic) (peripheral): Secondary | ICD-10-CM | POA: Diagnosis not present

## 2018-04-12 DIAGNOSIS — E1151 Type 2 diabetes mellitus with diabetic peripheral angiopathy without gangrene: Secondary | ICD-10-CM | POA: Diagnosis not present

## 2018-04-12 DIAGNOSIS — L97812 Non-pressure chronic ulcer of other part of right lower leg with fat layer exposed: Secondary | ICD-10-CM | POA: Diagnosis not present

## 2018-04-12 DIAGNOSIS — E11622 Type 2 diabetes mellitus with other skin ulcer: Secondary | ICD-10-CM | POA: Diagnosis not present

## 2018-04-13 ENCOUNTER — Telehealth: Payer: Self-pay

## 2018-04-13 ENCOUNTER — Telehealth: Payer: Self-pay | Admitting: *Deleted

## 2018-04-13 DIAGNOSIS — E1151 Type 2 diabetes mellitus with diabetic peripheral angiopathy without gangrene: Secondary | ICD-10-CM | POA: Diagnosis not present

## 2018-04-13 DIAGNOSIS — E11622 Type 2 diabetes mellitus with other skin ulcer: Secondary | ICD-10-CM | POA: Diagnosis not present

## 2018-04-13 DIAGNOSIS — I872 Venous insufficiency (chronic) (peripheral): Secondary | ICD-10-CM | POA: Diagnosis not present

## 2018-04-13 DIAGNOSIS — I2510 Atherosclerotic heart disease of native coronary artery without angina pectoris: Secondary | ICD-10-CM | POA: Diagnosis not present

## 2018-04-13 DIAGNOSIS — L97812 Non-pressure chronic ulcer of other part of right lower leg with fat layer exposed: Secondary | ICD-10-CM | POA: Diagnosis not present

## 2018-04-13 DIAGNOSIS — L97211 Non-pressure chronic ulcer of right calf limited to breakdown of skin: Secondary | ICD-10-CM | POA: Diagnosis not present

## 2018-04-13 MED ORDER — SENNOSIDES-DOCUSATE SODIUM 8.6-50 MG PO TABS: 1.0000 | ORAL_TABLET | Freq: Every day | ORAL | 0 refills | Status: DC | PRN

## 2018-04-13 NOTE — Telephone Encounter (Signed)
Spoke with [name] informing her Dr. Darnell Level is giving verbal orders for verbal orders to extend nurse visits. Verbalizes understanding and expresses her thanks.

## 2018-04-13 NOTE — Telephone Encounter (Signed)
Notified patient of rx that was sent in and she states that she is not taking any imodium.  Patient verbalizes understanding of all instructions and will keep [place] updated.

## 2018-04-13 NOTE — Progress Notes (Signed)
JAKAYA, JACOBOWITZ ([id number]) Visit Report for 04/10/2018 Arrival Information Details Patient Name: Staten, Chessica C. Date of Service: 04/10/2018 3:15 PM Medical Record Number: [id number] Patient Account Number: [id number] Date of Birth/Sex: October 22, 1927 (85 y.o. F) Treating RN: Montey Hora Primary Care Karry Barrilleaux: Ria Bush Other Clinician: Referring Deshan Hemmelgarn: Ria Bush Treating Omaria Plunk/Extender: Melburn Hake, HOYT Weeks in Treatment: 2 Visit Information History Since Last Visit Added or deleted any medications: No Patient Arrived: [place] Any new allergies or adverse reactions: No Arrival Time: 15:36 Had a fall or experienced change in No Accompanied By: daughter in law activities of daily living that may affect Transfer Assistance: None risk of falls: Patient Identification Verified: Yes Signs or symptoms of abuse/neglect since last visito No Secondary Verification Process Completed: Yes Hospitalized since last visit: No Implantable device outside of the clinic excluding No cellular tissue based products placed in the center since last visit: Has Dressing in Place as Prescribed: Yes Pain Present Now: Yes Electronic Signature(s) Signed: 04/10/2018 3:43:51 PM By: Lorine Bears RCP, RRT, CHT Entered By: Lorine Bears on 04/10/2018 15:37:53 Sickinger, [name] ([id number]) -------------------------------------------------------------------------------- Lower Extremity Assessment Details Patient Name: Fleek, Kailan C. Date of Service: 04/10/2018 3:15 PM Medical Record Number: [id number] Patient Account Number: [id number] Date of Birth/Sex: 02-27-1928 (85 y.o. F) Treating RN: Harold Barban Primary Care Sri Clegg: Ria Bush Other Clinician: Referring Alvan Culpepper: Ria Bush Treating Leafy Motsinger/Extender: STONE III, HOYT Weeks in Treatment: 2 Edema Assessment Assessed: [Left: No] [Right: No] Edema: [Left: Ye] [Right: s] Calf Left:  Right: Point of Measurement: 30 cm From Medial Instep cm 31.5 cm Ankle Left: Right: Point of Measurement: 10 cm From Medial Instep cm 19.4 cm Vascular Assessment Claudication: Claudication Assessment [Right:None] Pulses: Dorsalis Pedis Palpable: [Right:Yes] Posterior Tibial Palpable: [Right:Yes] Extremity colors, hair growth, and conditions: Extremity Color: [Right:Red] Hair Growth on Extremity: [Right:No] Temperature of Extremity: [Right:Warm] Capillary Refill: [Right:< 3 seconds] Toe Nail Assessment Left: Right: Thick: Yes Discolored: Yes Deformed: Yes Improper Length and Hygiene: No Electronic Signature(s) Signed: 04/12/2018 2:41:13 PM By: Harold Barban Entered By: Harold Barban on 04/10/2018 15:53:18 Goldston, Erdine C. ([id number]) -------------------------------------------------------------------------------- Multi Wound Chart Details Patient Name: [name], Danah C. Date of Service: 04/10/2018 3:15 PM Medical Record Number: [id number] Patient Account Number: [id number] Date of Birth/Sex: 04-14-1927 (85 y.o. 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] [8:Diabetic Wound/Ulcer of the Lower Extremity] [N/A:N/A] Comorbid History: [7:N/A] [8:Cataracts, Anemia, Arrhythmia, Coronary Artery Disease, Hypertension, Myocardial Infarction, Type II Diabetes, Osteoarthritis] [N/A:N/A] Date Acquired: [7:03/02/2018] [8:03/09/2018]  [N/A:N/A] Weeks of Treatment: [7:2] [8:2] [N/A:N/A] Wound Status: [7:Healed - Epithelialized] [8:Open] [N/A:N/A] Measurements L x W x D [7:0x0x0] [8:3.4x2x0.3] [N/A:N/A] (cm) Area (cm) : [7:0] [8:5.341] [N/A:N/A] Volume (cm) : [7:0] [8:1.602] [N/A:N/A] % Reduction in Area: [7:100.00%] [8:-183.30%] [N/A:N/A] % Reduction in Volume: [7:100.00%] [8:-183.50%] [N/A:N/A] Classification: [7:Grade 2] [8:Grade 2] [N/A:N/A] Exudate Amount: [7:N/A] [8:Large] [N/A:N/A] Exudate Type: [7:N/A] [8:Serous] [N/A:N/A] Exudate Color: [7:N/A] [8:amber] [N/A:N/A] Wound Margin: [7:N/A] [8:Flat and Intact] [N/A:N/A] Granulation Amount: [7:N/A] [8:None Present (0%)] [N/A:N/A] Necrotic Amount: [7:N/A] [8:Large (67-100%)] [N/A:N/A] Epithelialization: [7:N/A] [8:None] [N/A:N/A] Periwound Skin Texture: [7:No Abnormalities Noted] [8:Excoriation: No Induration: No Callus: No  Crepitus: No Rash: No Scarring: No] [N/A:N/A] Periwound Skin Moisture: [7:No Abnormalities Noted] [8:Dry/Scaly: Yes Maceration: No] [N/A:N/A] Periwound Skin Color: [7:No Abnormalities Noted] [8:Erythema: Yes Atrophie Blanche: No] [N/A:N/A] Cyanosis: No Ecchymosis: No Hemosiderin Staining: No Mottled: No Pallor: No Rubor: No Erythema Location: N/A Circumferential N/A Temperature: N/A No Abnormality N/A Tenderness on Palpation: No Yes N/A Wound Preparation: N/A Ulcer Cleansing: N/A Rinsed/Irrigated with Saline Topical Anesthetic Applied: Other: lidocaine 4% Treatment Notes Electronic Signature(s) Signed: 04/10/2018 4:56:07 PM By: Montey Hora Entered By: Montey Hora on 04/10/2018 16:13:44 Allocca, [name] ([id number]) -------------------------------------------------------------------------------- [place] Details Patient Name: Rutt, Shari C. Date of Service: 04/10/2018 3:15 PM Medical Record Number: [id number] Patient Account Number: [id number] Date of Birth/Sex: [birth year] (85 y.o. F) Treating RN:  Montey Hora Primary Care Laila Myhre: Ria Bush Other Clinician: Referring Jermale Crass: Ria Bush Treating Bruchy Mikel/Extender: Melburn Hake, HOYT Weeks in Treatment: 2 Active Inactive Abuse / Safety / Falls / Self Care Management Nursing Diagnoses: Potential for falls Goals: Patient will not experience any injury related to falls Date Initiated: 03/23/2018 Target Resolution Date: 06/09/2018 Goal Status: Active Interventions: Assess fall risk on admission and as needed Notes: Orientation to the Wound Care Program Nursing Diagnoses: Knowledge deficit related to the wound healing center program Goals: Patient/caregiver will verbalize understanding of the [place] Program Date Initiated: 03/23/2018 Target Resolution Date: 06/09/2018 Goal Status: Active Interventions: Provide education on orientation to the wound center Notes: Pain, Acute or Chronic Nursing Diagnoses: Pain, acute or chronic: actual or potential Goals: Patient will verbalize adequate pain control and receive pain control interventions during procedures as needed Date Initiated: 03/23/2018 Target Resolution Date: 06/09/2018 Goal Status: Active Interventions: Assess comfort goal upon admission Sliger, Sadee C. ([id number]) Notes: Wound/Skin Impairment Nursing Diagnoses: Impaired tissue integrity Goals: Ulcer/skin breakdown will heal within 14 weeks Date Initiated: 03/23/2018 Target Resolution Date: 06/09/2018 Goal Status: Active Interventions: Assess patient/caregiver ability to obtain necessary supplies Assess patient/caregiver ability to perform ulcer/skin care regimen upon admission and as needed Assess ulceration(s) every visit Notes: Electronic Signature(s) Signed: 04/10/2018 4:56:07 PM By: Montey Hora Entered By: Montey Hora on 04/10/2018 16:13:36 Mendizabal, [name] ([id number]) -------------------------------------------------------------------------------- Pain Assessment  Details Patient Name: Fidalgo, Daleena C. Date of Service: 04/10/2018 3:15 PM Medical Record Number: [id number] Patient Account Number: [id number] Date of Birth/Sex: 03/10/1928 (85 y.o. F) Treating RN: Montey Hora Primary Care Sherald Balbuena: Ria Bush Other Clinician: Referring Alan Riles: Ria Bush Treating Layana Konkel/Extender: Melburn Hake, HOYT Weeks in Treatment: 2 Active Problems Location of Pain Severity and Description of Pain Patient Has [name] Yes Site Locations Rate the pain. Current Pain Level: 5 Pain Management and Medication Current Pain Management: Electronic Signature(s) Signed: 04/10/2018 3:43:51 PM By: Lorine Bears RCP, RRT, CHT Signed: 04/10/2018 4:56:07 PM By: Montey Hora Entered By: Lorine Bears on 04/10/2018 15:38:05 Oettinger, [name] ([id number]) -------------------------------------------------------------------------------- Patient/Caregiver Education Details Patient Name: Gwynn, Nyeshia C. Date of Service: 04/10/2018 3:15 PM Medical Record Number: [id number] Patient Account Number: [id number] Date of Birth/Gender: 1927/04/11 (85 y.o. F) Treating RN: Montey Hora Primary Care Physician: Ria Bush Other Clinician: Referring Physician: Ria Bush Treating Physician/Extender: Sharalyn Ink in Treatment: 2 Education Assessment Education Provided To: Patient Education Topics Provided Wound/Skin Impairment: Handouts: Other: wound care as ordered Methods: Demonstration, Explain/Verbal Responses: State content correctly Electronic Signature(s) Signed: 04/10/2018 4:56:07 PM By: Montey Hora Entered By: Montey Hora on 04/10/2018 16:38:17 Halling, Genesi Loletha Grayer ([id number]) -------------------------------------------------------------------------------- Wound Assessment Details Patient Name: Galeas, Nedda C. Date of Service: 04/10/2018 3:15 PM Medical Record Number:  [id number] Patient Account Number:  [id number] Date of Birth/Sex: 14-Dec-1927 (85 y.o. F) Treating RN: Harold Barban Primary Care Akesha Uresti: Ria Bush Other Clinician: Referring Mariusz Jubb: Ria Bush Treating Hisako Bugh/Extender: STONE III, HOYT Weeks in Treatment: 2 Wound Status Wound Number: 7 Primary Diabetic Wound/Ulcer of the Lower Etiology: Extremity Wound Location: Right, Medial Lower Leg Wound Status: Healed - Epithelialized Wounding Event: Trauma Date Acquired: 03/02/2018 Weeks Of Treatment: 2 Clustered Wound: No Wound Measurements Length: (cm) 0 % Width: (cm) 0 % Depth: (cm) 0 Area: (cm) 0 Volume: (cm) 0 Reduction in Area: 100% Reduction in Volume: 100% Wound Description Classification: Grade 2 Periwound Skin Texture Texture Color No Abnormalities Noted: No No Abnormalities Noted: No Moisture No Abnormalities Noted: No Electronic Signature(s) Signed: 04/12/2018 2:41:13 PM By: Harold Barban Entered By: Harold Barban on 04/10/2018 15:50:27 Salminen, Kandyce C. ([id number]) -------------------------------------------------------------------------------- Wound Assessment Details Patient Name: Iannacone, Demeka C. Date of Service: 04/10/2018 3:15 PM Medical Record Number: [id number] Patient Account Number: [id number] Date of Birth/Sex: [birth year] (85 y.o. F) Treating RN: Harold Barban Primary Care Izzak Fries: Ria Bush Other Clinician: Referring Glendale Youngblood: Ria Bush Treating Mckenleigh Tarlton/Extender: STONE III, HOYT Weeks in Treatment: 2 Wound Status Wound Number: 8 Primary Diabetic Wound/Ulcer of the Lower Extremity Etiology: Wound Location: Right Lower Leg - Lateral Wound Open Wounding Event: Trauma Status: Date Acquired: 03/09/2018 Comorbid Cataracts, Anemia, Arrhythmia, Coronary Artery Weeks Of Treatment: 2 History: Disease, Hypertension, Myocardial Infarction, Clustered Wound: No Type II Diabetes, Osteoarthritis Photos Photo Uploaded By: Harold Barban on 04/10/2018  16:26:36 Wound Measurements Length: (cm) 3.4 Width: (cm) 2 Depth: (cm) 0.3 Area: (cm) 5.341 Volume: (cm) 1.602 % Reduction in Area: -183.3% % Reduction in Volume: -183.5% Epithelialization: None Tunneling: No Undermining: No Wound Description Classification: Grade 2 Wound Margin: Flat and Intact Exudate Amount: Large Exudate Type: Serous Exudate Color: amber Foul Odor After Cleansing: No Slough/Fibrino Yes Wound Bed Granulation Amount: None Present (0%) Exposed Structure Necrotic Amount: Large (67-100%) Fascia Exposed: No Necrotic Quality: Adherent Slough Fat Layer (Subcutaneous Tissue) Exposed: Yes Tendon Exposed: No Muscle Exposed: No Joint Exposed: No Bone Exposed: No Periwound Skin Texture Cooler, Daje C. ([id number]) Texture Color No Abnormalities Noted: No No Abnormalities Noted: No Callus: No Atrophie Blanche: No Crepitus: No Cyanosis: No Excoriation: No Ecchymosis: No Induration: No Erythema: Yes Rash: No Erythema Location: Circumferential Scarring: No Hemosiderin Staining: No Mottled: No Moisture Pallor: No No Abnormalities Noted: No Rubor: No Dry / Scaly: Yes Maceration: No Temperature / Pain Temperature: No Abnormality Tenderness on Palpation: Yes Wound Preparation Ulcer Cleansing: Rinsed/Irrigated with Saline Topical Anesthetic Applied: Other: lidocaine 4%, Treatment Notes Wound #8 (Right, Lateral Lower Leg) Notes santyl, abd, kerlix and netting Electronic Signature(s) Signed: 04/12/2018 2:41:13 PM By: Harold Barban Entered By: Harold Barban on 04/10/2018 15:50:15 Showman, [name] ([id number]) -------------------------------------------------------------------------------- Vitals Details Patient Name: Hegg, Zamari C. Date of Service: 04/10/2018 3:15 PM Medical Record Number: [id number] Patient Account Number: [id number] Date of Birth/Sex: [birth year] (85 y.o. F) Treating RN: Montey Hora Primary Care Cathye Kreiter: Ria Bush Other Clinician: Referring [place] Chelf: Ria Bush Treating Claira Jeter/Extender: STONE III, HOYT Weeks in Treatment: 2 Vital Signs Time Taken: 15:38 Temperature (F): 97.7 Pulse (bpm): 71 Respiratory Rate (breaths/min): 18 Blood Pressure (mmHg): 126/90 Reference Range: 80 - 120 mg / dl Electronic Signature(s) Signed: 04/10/2018 3:43:51 PM By: Lorine Bears RCP, RRT, CHT Entered By: Lorine Bears on 04/10/2018 15:40:53

## 2018-04-13 NOTE — Telephone Encounter (Addendum)
Patient called stating that she is constipated and feels that the pantoprazole she was recently switched to is causing this. Patient stated that she has not had a bowel movement in two days and she is miserable. Patient stated that she just went to the bathroom and only a few Amster balls of stool came out. [facility] [employer]

## 2018-04-13 NOTE — Telephone Encounter (Signed)
Don't think pantoprazole should cause these symptoms. Recommend she try senna - sent to pharmacy over weekend - and update [place] with effect into next week.  Make sure she's not taking imodium.

## 2018-04-13 NOTE — Telephone Encounter (Signed)
Mitzi Hansen nurse with [facility] left v/m requesting to extend nurse visits for ulcer on rt leg to 2 x a wk for 4 wks and 1 x a wk for 1 wk.

## 2018-04-13 NOTE — Telephone Encounter (Signed)
Agree with this. Thank you.  

## 2018-04-16 ENCOUNTER — Encounter: Payer: Medicare Other | Admitting: Physician Assistant

## 2018-04-16 DIAGNOSIS — L03115 Cellulitis of right lower limb: Secondary | ICD-10-CM | POA: Diagnosis not present

## 2018-04-16 DIAGNOSIS — I87331 Chronic venous hypertension (idiopathic) with ulcer and inflammation of right lower extremity: Secondary | ICD-10-CM | POA: Diagnosis not present

## 2018-04-16 DIAGNOSIS — L97812 Non-pressure chronic ulcer of other part of right lower leg with fat layer exposed: Secondary | ICD-10-CM | POA: Diagnosis not present

## 2018-04-16 DIAGNOSIS — E11622 Type 2 diabetes mellitus with other skin ulcer: Secondary | ICD-10-CM | POA: Diagnosis not present

## 2018-04-16 DIAGNOSIS — S80811S Abrasion, right lower leg, sequela: Secondary | ICD-10-CM | POA: Diagnosis not present

## 2018-04-16 NOTE — Progress Notes (Signed)
AMENAH, TUCCI ([id number]) Visit Report for 04/16/2018 Chief Complaint Document Details Patient Name: [name], [name] Date of Service: 04/16/2018 1:45 PM Medical Record Number: [id number] Patient Account Number: [id number] Date of Birth/Sex: 06/26/1927 (85 y.o. F) Treating RN: Cornell Barman Primary Care Provider: Ria Bush Other Clinician: Referring Provider: Ria Bush Treating Provider/Extender: Melburn Hake, HOYT Weeks in Treatment: 3 Information Obtained from: Patient Chief Complaint RLE wounds Electronic Signature(s) Signed: 04/16/2018 3:59:30 PM By: Worthy Keeler PA-C Entered By: Worthy Keeler on 04/16/2018 13:49:48 Brinkman, [name] ([id number]) -------------------------------------------------------------------------------- Debridement Details Patient Name: Wieck, Kinsie C. Date of Service: 04/16/2018 1:45 PM Medical Record Number: [id number] Patient Account Number: [id number] Date of Birth/Sex: 20-Feb-1928 (85 y.o. F) Treating RN: Harold Barban Primary Care Provider: Ria Bush Other Clinician: Referring Provider: Ria Bush Treating Provider/Extender: Melburn Hake, HOYT Weeks in Treatment: 3 Debridement Performed for Wound #8 Right,Lateral Lower Leg Assessment: Performed By: Physician STONE III, HOYT E., PA-C Debridement Type: Debridement Severity of Tissue Pre Fat layer exposed Debridement: Level of Consciousness (Pre- Awake and Alert procedure): Pre-procedure Verification/Time Yes - 14:12 Out Taken: Start Time: 14:12 Pain Control: Lidocaine Total Area Debrided (L x W): 3 (cm) x 1.9 (cm) = 5.7 (cm) Tissue and other material Viable, Non-Viable, Slough, Subcutaneous, Slough debrided: Level: Skin/Subcutaneous Tissue Debridement Description: Excisional Instrument: Curette Bleeding: Minimum Hemostasis Achieved: Pressure Procedural Pain: 4 Post Procedural Pain: 4 Response to Treatment: Procedure was tolerated well Level of  Consciousness Responds to Painful Stimuli (Post-procedure): Post Debridement Measurements of Total Wound Length: (cm) 3 Width: (cm) 1.9 Depth: (cm) 0.1 Volume: (cm) 0.448 Character of Wound/Ulcer Post Debridement: Improved Severity of Tissue Post Debridement: Fat layer exposed Post Procedure Diagnosis Same as Pre-procedure Electronic Signature(s) Signed: 04/16/2018 3:50:02 PM By: Harold Barban Signed: 04/16/2018 3:59:30 PM By: Worthy Keeler PA-C Entered By: Harold Barban on 04/16/2018 14:15:58 Culmer, Scotty C. ([id number]) -------------------------------------------------------------------------------- HPI Details Patient Name: Mcaleer, [name] Date of Service: 04/16/2018 1:45 PM Medical Record Number: [id number] Patient Account Number: [id number] Date of Birth/Sex: 1928/03/23 (85 y.o. F) Treating RN: Cornell Barman Primary Care Provider: Ria Bush Other Clinician: Referring Provider: Ria Bush Treating Provider/Extender: Melburn Hake, HOYT Weeks in Treatment: 3 History of Present Illness HPI Description: Readmission: 03/23/18 on evaluation today patient presents for readmission entire clinic concerning issues that she has been having for the past couple of weeks in regard to her right lower extremity. The medial injury actually occurred when her dog tried to take a blanket from her and subsequently scratched her with his toenail. Subsequently the lateral injury was a wound where she dropped a knife in her kitchen and hit the side of her leg. With that being said both have been open for some time and home health nurse that has been coming out marked for her where the redness was several days ago. Nonetheless this has spread will be on the margin of what was marked. She was placed on Keflex which was started two days ago but at this point it does not seem to have helped with any improvement in the overall appearance of the wounds. No fevers, chills, nausea,  or vomiting noted at this time. 04/02/18 Seen today for follow up and management of right lower extremity wounds. Right lateral wound with adherent slough. She is complaining of pain to touch which limited exam. Recently treated in the ED for cellulitis and wound culture positive for MRSA. Completed a dose of Vancomycin and transitioned to doxycycline by mouth. Erythema remains present with improvement  since last visit. Right medial wound with scab present. No fevers, chills, nausea, or vomiting noted at this time. 04/10/18 on evaluation today patient appears to be doing a [place] better in regard to her right lateral lower [name]. The slough does seem to be coming off although it is slow from the wound bed. Fortunately there does not appear to be any evidence of infection at this time which is good news. With that being said I do believe that she is going to require longer treatment with the Santyl simply due to the fact that I'm not able to really debride the wound due to the pain that she is experiencing. 04/16/18 upon evaluation today patient's wound bed currently on the lateral lower extremity on the right appears to be doing a [place] better at this point. We are utilizing sensible currently. Overall I'm very pleased with how things are progressing although there still is some work to do before this will be completely healed. No fevers, chills, nausea, or vomiting noted at this time. Electronic Signature(s) Signed: 04/16/2018 3:59:30 PM By: Worthy Keeler PA-C Entered By: Worthy Keeler on 04/16/2018 15:58:00 Rasp, [name] ([id number]) -------------------------------------------------------------------------------- Physical Exam Details Patient Name: Fontenette, Keyonta C. Date of Service: 04/16/2018 1:45 PM Medical Record Number: [id number] Patient Account Number: [id number] Date of Birth/Sex: 1927-12-26 (85 y.o. F) Treating RN: Cornell Barman Primary Care Provider: Ria Bush  Other Clinician: Referring Provider: Ria Bush Treating Provider/Extender: STONE III, HOYT Weeks in Treatment: 3 Constitutional Well-nourished and well-hydrated in no acute distress. Respiratory normal breathing without difficulty. Psychiatric this patient is able to make decisions and demonstrates good insight into disease process. Alert and Oriented x 3. pleasant and cooperative. Notes With sharp debridement the patient did have some discomfort today although this was minimal. There does not appear to be any signs of infection at this time which is good news. Overall I'm very pleased with how things seem to be progressing. Electronic Signature(s) Signed: 04/16/2018 3:59:30 PM By: Worthy Keeler PA-C Entered By: Worthy Keeler on 04/16/2018 15:58:33 Hoffman, [name] ([id number]) -------------------------------------------------------------------------------- Physician Orders Details Patient Name: Dolberry, Phiona C. Date of Service: 04/16/2018 1:45 PM Medical Record Number: [id number] Patient Account Number: [id number] Date of Birth/Sex: Jun 16, 1927 (85 y.o. F) Treating RN: Harold Barban Primary Care Provider: Ria Bush Other Clinician: Referring Provider: Ria Bush Treating Provider/Extender: Melburn Hake, HOYT Weeks in Treatment: 3 Verbal / Phone Orders: No Diagnosis Coding ICD-10 Coding Code Description I87.331 Chronic venous hypertension (idiopathic) with ulcer and inflammation of right lower extremity E11.622 Type 2 diabetes mellitus with other skin ulcer L97.812 Non-pressure chronic ulcer of other part of right lower leg with fat layer exposed L03.115 Cellulitis of right lower limb Wound Cleansing Wound #8 Right,Lateral Lower Leg o Clean wound with Normal Saline. Primary Wound Dressing Wound #8 Right,Lateral Lower Leg o Santyl Ointment Secondary Dressing Wound #8 Right,Lateral Lower Leg o ABD and Kerlix/Conform - secured with  netting Dressing Change Frequency Wound #8 Right,Lateral Lower Leg o Change dressing every day. Follow-up Appointments Wound #8 Right,Lateral Lower Leg o Return Appointment in 1 week. Edema Control Wound #8 Right,Lateral Lower Leg o Elevate legs to the level of the heart and pump ankles as often as possible Home Health Wound #8 Right,Lateral Lower Leg o [place] Nurse may visit PRN to address patientos wound care needs. o FACE TO FACE ENCOUNTER: MEDICARE and MEDICAID PATIENTS: I certify that this patient is under my care and that  I had a face-to-face encounter that meets the physician face-to-face encounter requirements with this patient on this date. The encounter with the patient was in whole or in part for the following MEDICAL CONDITION: (primary reason for [place]) MEDICAL NECESSITY: I certify, that based on my findings, NURSING services are a medically necessary home health service. HOME BOUND STATUS: I certify that my clinical findings support that this patient is homebound (i.e., Due to illness or injury, pt requires aid of Hannon, Peytin C. ([id number]) supportive devices such as crutches, cane, wheelchairs, walkers, the use of special transportation or the assistance of another person to leave their place of residence. There is a normal inability to leave the home and doing so requires considerable and taxing effort. Other absences are for medical reasons / religious services and are infrequent or of short duration when for other reasons). o If current dressing causes regression in wound condition, may D/C ordered dressing product/s and apply Normal Saline Moist Dressing daily until next [place] / Other MD appointment. [place] of regression in wound condition at [phone]. o Please direct any NON-WOUND related issues/requests for orders to patient's Primary Care  Physician Medications-please add to medication list. Wound #8 Right,Lateral Lower Leg o Santyl Enzymatic Ointment Electronic Signature(s) Signed: 04/16/2018 3:50:02 PM By: Harold Barban Signed: 04/16/2018 3:59:30 PM By: Worthy Keeler PA-C Entered By: Harold Barban on 04/16/2018 14:18:07 Prabhakar, Mitchelle C. ([id number]) -------------------------------------------------------------------------------- Problem List Details Patient Name: Selle, Kambrea C. Date of Service: 04/16/2018 1:45 PM Medical Record Number: [id number] Patient Account Number: [id number] Date of Birth/Sex: December 27, 1927 (85 y.o. F) Treating RN: Cornell Barman Primary Care Provider: Ria Bush Other Clinician: Referring Provider: Ria Bush Treating Provider/Extender: Melburn Hake, HOYT Weeks in Treatment: 3 Active Problems ICD-10 Evaluated Encounter Code Description Active Date Today Diagnosis I87.331 Chronic venous hypertension (idiopathic) with ulcer and 03/23/2018 No Yes inflammation of right lower extremity E11.622 Type 2 diabetes mellitus with other skin ulcer 03/23/2018 No Yes L97.812 Non-pressure chronic ulcer of other part of right lower leg 03/23/2018 No Yes with fat layer exposed L03.115 Cellulitis of right lower limb 03/23/2018 No Yes Inactive Problems Resolved Problems Electronic Signature(s) Signed: 04/16/2018 3:59:30 PM By: Worthy Keeler PA-C Entered By: Worthy Keeler on 04/16/2018 13:49:43 Friedly, [name] ([id number]) -------------------------------------------------------------------------------- Progress Note Details Patient Name: Mccartney, Vesna C. Date of Service: 04/16/2018 1:45 PM Medical Record Number: [id number] Patient Account Number: [id number] Date of Birth/Sex: 02/24/1928 (85 y.o. F) Treating RN: Cornell Barman Primary Care Provider: Ria Bush Other Clinician: Referring Provider: Ria Bush Treating Provider/Extender: Melburn Hake, HOYT Weeks in Treatment:  3 Subjective Chief Complaint Information obtained from Patient RLE wounds History of Present Illness (HPI) Readmission: 03/23/18 on evaluation today patient presents for readmission entire clinic concerning issues that she has been having for the past couple of weeks in regard to her right lower extremity. The medial injury actually occurred when her dog tried to take a blanket from her and subsequently scratched her with his toenail. Subsequently the lateral injury was a wound where she dropped a knife in her kitchen and hit the side of her leg. With that being said both have been open for some time and home health nurse that has been coming out marked for her where the redness was several days ago. Nonetheless this has spread will be on the margin of what was marked. She was placed on Keflex which was started two days ago but at this point it does not seem  to have helped with any improvement in the overall appearance of the wounds. No fevers, chills, nausea, or vomiting noted at this time. 04/02/18 Seen today for follow up and management of right lower extremity wounds. Right lateral wound with adherent slough. She is complaining of pain to touch which limited exam. Recently treated in the ED for cellulitis and wound culture positive for MRSA. Completed a dose of Vancomycin and transitioned to doxycycline by mouth. Erythema remains present with improvement since last visit. Right medial wound with scab present. No fevers, chills, nausea, or vomiting noted at this time. 04/10/18 on evaluation today patient appears to be doing a [place] better in regard to her right lateral lower [name]. The slough does seem to be coming off although it is slow from the wound bed. Fortunately there does not appear to be any evidence of infection at this time which is good news. With that being said I do believe that she is going to require longer treatment with the Santyl simply due to the fact that I'm  not able to really debride the wound due to the pain that she is experiencing. 04/16/18 upon evaluation today patient's wound bed currently on the lateral lower extremity on the right appears to be doing a [place] better at this point. We are utilizing sensible currently. Overall I'm very pleased with how things are progressing although there still is some work to do before this will be completely healed. No fevers, chills, nausea, or vomiting noted at this time. Patient History Information obtained from Patient. Family History Diabetes - Father,Siblings,Child, Hypertension - Child, Lung Disease - Child, Stroke - Mother, No family history of Cancer, Heart Disease, Kidney Disease, Seizures, Thyroid Problems. Social History Former smoker, Marital Status - Single, Alcohol Use - Never, Drug Use - No History, Caffeine Use - Daily. Medical And Surgical History Notes Constitutional Symptoms (General Health) High Blood Pressure, open heart surgery, gall stone, hysterectomy; Fluid pill, Anxiety Jurgens, Ashlyn C. ([id number]) Review of Systems (ROS) Constitutional Symptoms (General Health) Denies complaints or symptoms of Fever, Chills. Respiratory The patient has no complaints or symptoms. Cardiovascular Complains or has symptoms of LE edema. Psychiatric The patient has no complaints or symptoms. Objective Constitutional Well-nourished and well-hydrated in no acute distress. Vitals Time Taken: 1:47 PM, Temperature: 97.5 F, Pulse: 61 bpm, Respiratory Rate: 18 breaths/min, Blood Pressure: 111/42 mmHg. Respiratory normal breathing without difficulty. Psychiatric this patient is able to make decisions and demonstrates good insight into disease process. Alert and Oriented x 3. pleasant and cooperative. General Notes: With sharp debridement the patient did have some discomfort today although this was minimal. There does not appear to be any signs of infection at this time which is good news.  Overall I'm very pleased with how things seem to be progressing. Integumentary (Hair, Skin) Wound #8 status is Open. Original cause of wound was Trauma. The wound is located on the Right,Lateral Lower Leg. The wound measures 3cm length x 1.9cm width x 0.2cm depth; 4.477cm^2 area and 0.895cm^3 volume. There is Fat Layer (Subcutaneous Tissue) Exposed exposed. There is no tunneling or undermining noted. There is a medium amount of serous drainage noted. The wound margin is flat and intact. There is small (1-33%) granulation within the wound bed. There is a large (67-100%) amount of necrotic tissue within the wound bed including Adherent Slough. The periwound skin appearance exhibited: Dry/Scaly, Erythema. The periwound skin appearance did not exhibit: Callus, Crepitus, Excoriation, Induration, Rash, Scarring, Maceration, Atrophie Blanche, Cyanosis, Ecchymosis, Hemosiderin  Staining, Mottled, Pallor, Rubor. The surrounding wound skin color is noted with erythema which is circumferential. Periwound temperature was noted as No Abnormality. The periwound has tenderness on palpation. Assessment Active Problems Neeson, Geneive C. ([id number]) ICD-10 Chronic venous hypertension (idiopathic) with ulcer and inflammation of right lower extremity Type 2 diabetes mellitus with other skin ulcer Non-pressure chronic ulcer of other part of right lower leg with fat layer exposed Cellulitis of right lower limb Procedures Wound #8 Pre-procedure diagnosis of Wound #8 is a Diabetic Wound/Ulcer of the Lower Extremity located on the Right,Lateral Lower Leg .Severity of Tissue Pre Debridement is: Fat layer exposed. There was a Excisional Skin/Subcutaneous Tissue Debridement with a total area of 5.7 sq cm performed by STONE III, HOYT E., PA-C. With the following instrument(s): Curette to remove Viable and Non-Viable tissue/material. Material removed includes Subcutaneous Tissue and Slough and after achieving pain  control using Lidocaine. No specimens were taken. A time out was conducted at 14:12, prior to the start of the procedure. A Minimum amount of bleeding was controlled with Pressure. The procedure was tolerated well with a pain level of 4 throughout and a pain level of 4 following the procedure. Post Debridement Measurements: 3cm length x 1.9cm width x 0.1cm depth; 0.448cm^3 volume. Character of Wound/Ulcer Post Debridement is improved. Severity of Tissue Post Debridement is: Fat layer exposed. Post procedure Diagnosis Wound #8: Same as Pre-Procedure Plan Wound Cleansing: Wound #8 Right,Lateral Lower Leg: Clean wound with Normal Saline. Primary Wound Dressing: Wound #8 Right,Lateral Lower Leg: Santyl Ointment Secondary Dressing: Wound #8 Right,Lateral Lower Leg: ABD and Kerlix/Conform - secured with netting Dressing Change Frequency: Wound #8 Right,Lateral Lower Leg: Change dressing every day. Follow-up Appointments: Wound #8 Right,Lateral Lower Leg: Return Appointment in 1 week. Edema Control: Wound #8 Right,Lateral Lower Leg: Elevate legs to the level of the heart and pump ankles as often as possible Home Health: Wound #8 Right,Lateral Lower Leg: [place] Nurse may visit PRN to address patient s wound care needs. FACE TO FACE ENCOUNTER: MEDICARE and MEDICAID PATIENTS: I certify that this patient is under my care and that I had a face-to-face encounter that meets the physician face-to-face encounter requirements with this patient on this date. The encounter with the patient was in whole or in part for the following MEDICAL CONDITION: (primary reason for [place]) MEDICAL NECESSITY: I certify, that based on my findings, NURSING services are a medically necessary home Molder, Janya C. ([id number]) health service. HOME BOUND STATUS: I certify that my clinical findings support that this patient is homebound (i.e., Due to illness or injury, pt  requires aid of supportive devices such as crutches, cane, wheelchairs, walkers, the use of special transportation or the assistance of another person to leave their place of residence. There is a normal inability to leave the home and doing so requires considerable and taxing effort. Other absences are for medical reasons / religious services and are infrequent or of short duration when for other reasons). If current dressing causes regression in wound condition, may D/C ordered dressing product/s and apply Normal Saline Moist Dressing daily until next [place] / Other MD appointment. [place] of regression in wound condition at [phone]. Please direct any NON-WOUND related issues/requests for orders to patient's Primary Care Physician Medications-please add to medication list.: Wound #8 Right,Lateral Lower Leg: Santyl Enzymatic Ointment We will continue with the above wound care measures for the next week. If anything changes or worsens in  the meantime she will contact the office and let me know otherwise will see were things stand at follow-up. Please see above for specific wound care orders. We will see patient for re-evaluation in 1 week(s) here in the clinic. If anything worsens or changes patient will contact our office for additional recommendations. Electronic Signature(s) Signed: 04/16/2018 3:59:30 PM By: Worthy Keeler PA-C Entered By: Worthy Keeler on 04/16/2018 15:58:43 Keisling, [name] ([id number]) -------------------------------------------------------------------------------- ROS/PFSH Details Patient Name: Malkowski, Ayushi C. Date of Service: 04/16/2018 1:45 PM Medical Record Number: [id number] Patient Account Number: [id number] Date of Birth/Sex: 1927-12-24 (85 y.o. F) Treating RN: Cornell Barman Primary Care Provider: Ria Bush Other Clinician: Referring Provider: Ria Bush Treating Provider/Extender: Melburn Hake, HOYT Weeks in  Treatment: 3 Information Obtained From Patient Wound History Constitutional Symptoms (General Health) Complaints and Symptoms: Negative for: Fever; Chills Medical History: Past Medical History Notes: High Blood Pressure, open heart surgery, gall stone, hysterectomy; Fluid pill, Anxiety Cardiovascular Complaints and Symptoms: Positive for: LE edema Medical History: Positive for: Arrhythmia; Coronary Artery Disease; Hypertension; Myocardial Infarction Negative for: Angina; Congestive Heart Failure; Deep Vein Thrombosis; Hypotension; Peripheral Arterial Disease; Peripheral Venous Disease; Phlebitis Eyes Medical History: Positive for: Cataracts - removed Negative for: Glaucoma; Optic Neuritis Hematologic/Lymphatic Medical History: Positive for: Anemia Negative for: Hemophilia; Human Immunodeficiency Virus; Lymphedema; Sickle Cell Disease Respiratory Complaints and Symptoms: No Complaints or Symptoms Medical History: Negative for: Aspiration; Asthma; Chronic Obstructive Pulmonary Disease (COPD); Pneumothorax; Sleep Apnea; Tuberculosis Endocrine Medical History: Positive for: Type II Diabetes Negative for: Type I Diabetes Biber, Bryahna C. ([id number]) Time with diabetes: no idea Treated with: Oral agents Blood sugar tested every day: No Genitourinary Medical History: Negative for: End Stage Renal Disease Immunological Medical History: Negative for: Lupus Erythematosus; Raynaudos; Scleroderma Integumentary (Skin) Medical History: Negative for: History of Burn; History of pressure wounds Musculoskeletal Medical History: Positive for: Osteoarthritis Negative for: Gout; Rheumatoid Arthritis; Osteomyelitis Neurologic Medical History: Negative for: Dementia; Neuropathy; Quadriplegia; Paraplegia; Seizure Disorder Oncologic Medical History: Negative for: Received Chemotherapy; Received Radiation Psychiatric Complaints and Symptoms: No Complaints or Symptoms HBO  Extended History Items Eyes: Cataracts Immunizations Pneumococcal Vaccine: Received Pneumococcal Vaccination: Yes Implantable Devices Family and Social History Cancer: No; Diabetes: Yes - Father,Siblings,Child; Heart Disease: No; Hypertension: Yes - Child; Kidney Disease: No; Lung Disease: Yes - Child; Seizures: No; Stroke: Yes - Mother; Thyroid Problems: No; Former smoker; Marital Status - Single; Alcohol Use: Never; Drug Use: No History; Caffeine Use: Daily; Financial Concerns: No; Food, Clothing or Shelter Needs: No; Support System Lacking: No; Transportation Concerns: No; Advanced Directives: Yes (Not Provided); Patient does not want information on Advanced Directives; Living Will: Yes (Not Provided) Physician Affirmation I have reviewed and agree with the above information. HAROLYN, COCKER ([id number]) Electronic Signature(s) Signed: 04/16/2018 3:59:30 PM By: Worthy Keeler PA-C Signed: 04/16/2018 4:05:56 PM By: Gretta Cool BSN, RN, CWS, Kim RN, BSN Entered By: Worthy Keeler on 04/16/2018 15:58:21 Meiser, [name] ([id number]) -------------------------------------------------------------------------------- SuperBill Details Patient Name: Weingart, Nelda C. Date of Service: 04/16/2018 Medical Record Number: [id number] Patient Account Number: [id number] Date of Birth/Sex: June 15, 1927 (85 y.o. F) Treating RN: Cornell Barman Primary Care Provider: Ria Bush Other Clinician: Referring Provider: Ria Bush Treating Provider/Extender: Melburn Hake, HOYT Weeks in Treatment: 3 Diagnosis Coding ICD-10 Codes Code Description I87.331 Chronic venous hypertension (idiopathic) with ulcer and inflammation of right lower extremity E11.622 Type 2 diabetes mellitus with other skin ulcer L97.812 Non-pressure chronic ulcer of other part of right lower leg with fat layer exposed  L03.115 Cellulitis of right lower limb Facility Procedures CPT4 Code Description: [id number] 11042 - DEB SUBQ TISSUE  20 SQ CM/< ICD-10 Diagnosis Description O83.254 Non-pressure chronic ulcer of other part of right lower leg wi Modifier: th fat layer expo Quantity: 1 sed Physician Procedures CPT4 Code Description: 9826415 83094 - WC PHYS SUBQ TISS 20 SQ CM ICD-10 Diagnosis Description M76.808 Non-pressure chronic ulcer of other part of right lower leg wi Modifier: th fat layer expo Quantity: 1 sed Electronic Signature(s) Signed: 04/16/2018 3:59:30 PM By: Worthy Keeler PA-C Entered By: Worthy Keeler on 04/16/2018 15:58:51

## 2018-04-17 ENCOUNTER — Other Ambulatory Visit: Payer: Self-pay

## 2018-04-17 DIAGNOSIS — L97812 Non-pressure chronic ulcer of other part of right lower leg with fat layer exposed: Secondary | ICD-10-CM | POA: Diagnosis not present

## 2018-04-17 DIAGNOSIS — L97211 Non-pressure chronic ulcer of right calf limited to breakdown of skin: Secondary | ICD-10-CM | POA: Diagnosis not present

## 2018-04-17 DIAGNOSIS — I872 Venous insufficiency (chronic) (peripheral): Secondary | ICD-10-CM | POA: Diagnosis not present

## 2018-04-17 DIAGNOSIS — E11622 Type 2 diabetes mellitus with other skin ulcer: Secondary | ICD-10-CM | POA: Diagnosis not present

## 2018-04-17 DIAGNOSIS — E1151 Type 2 diabetes mellitus with diabetic peripheral angiopathy without gangrene: Secondary | ICD-10-CM | POA: Diagnosis not present

## 2018-04-17 DIAGNOSIS — I2510 Atherosclerotic heart disease of native coronary artery without angina pectoris: Secondary | ICD-10-CM | POA: Diagnosis not present

## 2018-04-17 MED ORDER — HYDROCODONE-ACETAMINOPHEN 5-325 MG PO TABS: 1.0000 | ORAL_TABLET | Freq: Three times a day (TID) | ORAL | 0 refills | Status: DC | PRN

## 2018-04-17 NOTE — Telephone Encounter (Signed)
Name of Medication: hydrocodone apap 5-325 mg Name of Pharmacy: [facility] or Written Date and Quantity: # 1 on 03/07/18 Last Office Visit and Type: 03/30/18 HFU;                                              01/29/18 Annual Next Office Visit and Type: 02/01/19 CPX Last Controlled Substance Agreement Date: 10/16/17 Last UDS:10/11/17  Pt said she has one pill left and request refill done today.

## 2018-04-17 NOTE — Telephone Encounter (Signed)
E prescribed. plz notify pt

## 2018-04-18 ENCOUNTER — Ambulatory Visit (INDEPENDENT_AMBULATORY_CARE_PROVIDER_SITE_OTHER): Payer: Medicare Other | Admitting: Family Medicine

## 2018-04-18 ENCOUNTER — Encounter: Payer: Self-pay | Admitting: Family Medicine

## 2018-04-18 VITALS — BP 124/62 | HR 83 | Temp 98.2°F | Ht <= 58 in | Wt 141.8 lb

## 2018-04-18 DIAGNOSIS — L539 Erythematous condition, unspecified: Secondary | ICD-10-CM

## 2018-04-18 DIAGNOSIS — N6311 Unspecified lump in the right breast, upper outer quadrant: Secondary | ICD-10-CM | POA: Diagnosis not present

## 2018-04-18 DIAGNOSIS — I2510 Atherosclerotic heart disease of native coronary artery without angina pectoris: Secondary | ICD-10-CM

## 2018-04-18 DIAGNOSIS — E11622 Type 2 diabetes mellitus with other skin ulcer: Secondary | ICD-10-CM | POA: Diagnosis not present

## 2018-04-18 DIAGNOSIS — E1151 Type 2 diabetes mellitus with diabetic peripheral angiopathy without gangrene: Secondary | ICD-10-CM | POA: Diagnosis not present

## 2018-04-18 DIAGNOSIS — I872 Venous insufficiency (chronic) (peripheral): Secondary | ICD-10-CM | POA: Diagnosis not present

## 2018-04-18 DIAGNOSIS — L97211 Non-pressure chronic ulcer of right calf limited to breakdown of skin: Secondary | ICD-10-CM | POA: Diagnosis not present

## 2018-04-18 DIAGNOSIS — L97812 Non-pressure chronic ulcer of other part of right lower leg with fat layer exposed: Secondary | ICD-10-CM | POA: Diagnosis not present

## 2018-04-18 HISTORY — DX: Erythematous condition, unspecified: L53.9

## 2018-04-18 MED ORDER — CEPHALEXIN 500 MG PO CAPS: 500.0000 mg | ORAL_CAPSULE | Freq: Three times a day (TID) | ORAL | 0 refills | Status: DC

## 2018-04-18 NOTE — Assessment & Plan Note (Signed)
Treat possible early cellulitis with keflex abx, breast lump palpated at 10 o clock - will check imaging (mammo/US). Pt agrees with plan.

## 2018-04-18 NOTE — Addendum Note (Signed)
Addended by: Ria Bush on: 04/18/2018 04:36 PM   Modules accepted: Orders

## 2018-04-18 NOTE — Progress Notes (Addendum)
BP 124/62 (BP Location: Left Arm, Patient Position: Sitting, Cuff Size: Normal)   Pulse 83   Temp 98.2 F (36.8 C) (Oral)   Ht 4\' 9"  (1.448 m)   Wt 141 lb 12 oz (64.3 kg)   SpO2 97%   BMI 30.67 kg/m    CC: spot on R breast  Subjective:    Patient ID: [name], female    DOB: 17-Dec-1927, 85 y.o.   MRN: [id number]  HPI: NEILA TEEM is a 85 y.o. female presenting on 04/18/2018 for Breast Problem (C/o red spot on right breast that is sore Noticed about 6 days ago. )   Using walker today!  Continues seeing wound clinic for leg wound. Continues with [facility] wound care nurse. She was recently on antibiotics for leg wounds.   6d h/o red streak to R breast. Denies bug bites, inciting trauma/injury or falls to start this. Tender, not itchy. She has been using eucerin cream. No fevers or nausea.      Relevant past medical, surgical, family and social history reviewed and updated as indicated. Interim medical history since our last visit reviewed. Allergies and medications reviewed and updated. Outpatient Medications Prior to Visit  Medication Sig Dispense Refill  . acetaminophen (TYLENOL) 500 MG tablet Take 500 mg by mouth every 6 (six) hours as needed for mild pain.    Marland Kitchen ALPRAZolam (XANAX) 1 MG tablet Take 0.5 mg by mouth 4 (four) times daily as needed for anxiety.     Marland Kitchen aspirin EC 81 MG tablet Take 81 mg by mouth daily.    . collagenase (SANTYL) ointment Apply topically daily. 15 g 0  . diclofenac sodium (VOLTAREN) 1 % GEL Apply 1 application topically 3 (three) times daily. 1 Tube 1  . famotidine (PEPCID) 20 MG tablet Take 1 tablet (20 mg total) by mouth at bedtime.    . furosemide (LASIX) 20 MG tablet TAKE 0.5 TABLETS (10 MG TOTAL) BY MOUTH DAILY. 45 tablet 3  . glucosamine-chondroitin 500-400 MG tablet Take 1 tablet by mouth daily.     Marland Kitchen glucose blood (ONE TOUCH ULTRA TEST) test strip USE AS INSTRUCTED TO CHECK BLOOD SUGAR ONCE A DAY Dx code: E11.9 50 each 5  .  HYDROcodone-acetaminophen (NORCO/VICODIN) 5-325 MG tablet Take 1 tablet by mouth 3 (three) times daily as needed for moderate pain. 90 tablet 0  . loperamide (IMODIUM) 2 MG capsule Take 2 mg by mouth 4 (four) times daily as needed for diarrhea or loose stools.    Marland Kitchen loratadine (CLARITIN) 10 MG tablet Take 10 mg by mouth daily as needed for allergies.    . metoprolol tartrate (LOPRESSOR) 25 MG tablet TAKE ONE AND HALF TABLETS (37.5 MG TOTAL) BY MOUTH 2 (TWO) TIMES DAILY. 270 tablet 1  . Multiple Vitamin (MULTIVITAMIN) tablet Take 1 tablet by mouth daily. Patient reports takes a new vitamin to replace her centrum as recommended by her eye doctor.    . nitroGLYCERIN (NITROSTAT) 0.4 MG SL tablet Place 1 tablet (0.4 mg total) under the tongue every 5 (five) minutes as needed for chest pain. 25 tablet 3  . nystatin-triamcinolone ointment (MYCOLOG) APPLY 1 APPLICATION TOPICALLY 2 (TWO) TIMES DAILY. AS NEEDED FOR ITCHING 60 g 1  . ONETOUCH VERIO test strip USE TO TEST BLOOD SUGAR ONCE DAILY 100 each 6  . pantoprazole (PROTONIX) 40 MG tablet Take 1 tablet (40 mg total) by mouth daily. 30 tablet 3  . pioglitazone (ACTOS) 30 MG tablet TAKE 1 TABLET  BY MOUTH EVERY DAY 90 tablet 3  . pravastatin (PRAVACHOL) 20 MG tablet TAKE 1 TABLET BY MOUTH EVERY DAY 90 tablet 3  . Probiotic Product (ALIGN) 4 MG CAPS Take 1 capsule (4 mg total) by mouth daily.    . ramipril (ALTACE) 5 MG capsule TAKE ONE CAPSULE BY MOUTH EVERYDAY FOR BLOOD PRESSURE 90 capsule 1  . senna-docusate (SENOKOT-S) 8.6-50 MG tablet Take 1 tablet by mouth daily as needed for moderate constipation. 30 tablet 0  . sitaGLIPtin (JANUVIA) 100 MG tablet Take 1 tablet (100 mg total) by mouth daily. (Patient taking differently: Take 100 mg by mouth daily. Takes 1/2 tablet) 30 tablet 2  . traZODone (DESYREL) 150 MG tablet Take 150 mg by mouth at bedtime.     . triamcinolone cream (KENALOG) 0.1 % APPLY 1 APPLICATION TOPICALLY 2 (TWO) TIMES DAILY.  1  . vitamin  B-12 (CYANOCOBALAMIN) 1000 MCG tablet Take 1,000 mcg by mouth daily.    . vitamin E 400 UNIT capsule Take 400 Units by mouth daily.    . methocarbamol (ROBAXIN) 500 MG tablet Take 0.5-1 tablets (250-500 mg total) by mouth 3 (three) times daily as needed for muscle spasms. 20 tablet 0  . DEXILANT 30 MG capsule TAKE 1 CAPSULE (30 MG TOTAL) BY MOUTH DAILY. 30 capsule 11   No facility-administered medications prior to visit.      Per HPI unless specifically indicated in ROS section below Review of Systems Objective:    BP 124/62 (BP Location: Left Arm, Patient Position: Sitting, Cuff Size: Normal)   Pulse 83   Temp 98.2 F (36.8 C) (Oral)   Ht 4\' 9"  (1.448 m)   Wt 141 lb 12 oz (64.3 kg)   SpO2 97%   BMI 30.67 kg/m   Wt Readings from Last 3 Encounters:  04/18/18 141 lb 12 oz (64.3 kg)  04/11/18 142 lb 8 oz (64.6 kg)  04/09/18 142 lb (64.4 kg)    Physical Exam Vitals signs and nursing note reviewed.  Constitutional:      General: She is not in acute distress.    Appearance: Normal appearance. She is not ill-appearing.  Chest:     Breasts:        Right: Mass, skin change and tenderness present. No swelling, bleeding, inverted nipple or nipple discharge.        Left: Normal. No swelling, bleeding, inverted nipple, mass, skin change or tenderness.       Comments: Mild tender erythema lateral R breast into axilla  Small 1cm lump R breast at 10 o clock Lymphadenopathy:     Upper Body:     Right upper body: No supraclavicular or axillary adenopathy.     Left upper body: No supraclavicular or axillary adenopathy.  Neurological:     Mental Status: She is alert.        Lab Results  Component Value Date   CREATININE 0.79 03/25/2018   BUN 21 03/23/2018   NA 137 03/23/2018   K 4.7 03/23/2018   CL 104 03/23/2018   CO2 25 03/23/2018   Assessment & Plan:   Problem List Items Addressed This Visit    Erythema of breast - Primary    Treat possible early cellulitis with keflex  abx, breast lump palpated at 10 o clock - will check imaging (mammo/US). Pt agrees with plan.       Relevant Orders   US BREAST LTD UNI RIGHT INC AXILLA   MM DIAG BREAST TOMO BILATERAL  Other Visit Diagnoses    Breast lump on right side at 10 o'clock position       Relevant Orders   US BREAST LTD UNI RIGHT INC AXILLA   MM DIAG BREAST TOMO BILATERAL       Meds ordered this encounter  Medications  . cephALEXin (KEFLEX) 500 MG capsule    Sig: Take 1 capsule (500 mg total) by mouth 3 (three) times daily.    Dispense:  21 capsule    Refill:  0   Orders Placed This Encounter  Procedures  . US BREAST LTD UNI RIGHT INC AXILLA    Standing Status:   Future    Standing Expiration Date:   06/17/2019    Order Specific Question:   Reason for Exam (SYMPTOM  OR DIAGNOSIS REQUIRED)    Answer:   R lateral breast erythema and lump 10 o clock    Order Specific Question:   Preferred imaging location?    Answer:   [facility]  . MM DIAG BREAST TOMO BILATERAL    Standing Status:   Future    Standing Expiration Date:   06/17/2019    Order Specific Question:   Reason for Exam (SYMPTOM  OR DIAGNOSIS REQUIRED)    Answer:   R lateral breast erythema with lump 10-11 o clock    Order Specific Question:   Preferred imaging location?    Answer:   [facility]    Follow up plan: No follow-ups on file.  Ria Bush, MD

## 2018-04-18 NOTE — Patient Instructions (Addendum)
Possible skin infection of right breast - treat with keflex antibiotic sent to pharmacy.  We will order mammogram. See our referral coordinator to schedule this.

## 2018-04-19 NOTE — Progress Notes (Signed)
KENDA, KLOEHN ([id number]) Visit Report for 04/16/2018 Arrival Information Details Patient Name: [name], [name] Date of Service: 04/16/2018 1:45 PM Medical Record Number: [id number] Patient Account Number: [id number] Date of Birth/Sex: 06/20/1927 (85 y.o. F) Treating RN: Cornell Barman Primary Care Moishe Schellenberg: Ria Bush Other Clinician: Referring Amneet Cendejas: Ria Bush Treating Cadince Hilscher/Extender: Melburn Hake, HOYT Weeks in Treatment: 3 Visit Information History Since Last Visit Added or deleted any medications: No Patient Arrived: Walker Any new allergies or adverse reactions: No Arrival Time: 13:46 Had a fall or experienced change in No Accompanied By: self activities of daily living that may affect Transfer Assistance: None risk of falls: Patient Identification Verified: Yes Signs or symptoms of abuse/neglect since last visito No Secondary Verification Process Completed: Yes Hospitalized since last visit: No Implantable device outside of the clinic excluding No cellular tissue based products placed in the center since last visit: Has Dressing in Place as Prescribed: Yes Pain Present Now: No Electronic Signature(s) Signed: 04/16/2018 3:03:36 PM By: Lorine Bears RCP, RRT, CHT Entered By: Lorine Bears on 04/16/2018 13:47:25 Sc[place]ille, [name] ([id number]) -------------------------------------------------------------------------------- Encounter Discharge Information Details Patient Name: [name], [name] Date of Service: 04/16/2018 1:45 PM Medical Record Number: [id number] Patient Account Number: [id number] Date of Birth/Sex: 02/02/1928 (85 y.o. F) Treating RN: Cornell Barman Primary Care Reggie Bise: Ria Bush Other Clinician: Referring Browning Southwood: Ria Bush Treating Jodeci Rini/Extender: Melburn Hake, HOYT Weeks in Treatment: 3 Encounter Discharge Information Items Post Procedure Vitals Discharge Condition: Stable Temperature  (F): 97.5 Ambulatory Status: Walker Pulse (bpm): 61 Discharge Destination: Home Respiratory Rate (breaths/min): 16 Transportation: Private Auto Blood Pressure (mmHg): 111/42 Accompanied By: self Schedule Follow-up Appointment: Yes Clinical Summary of Care: Electronic Signature(s) Signed: 04/16/2018 3:49:31 PM By: Gretta Cool, BSN, RN, CWS, Kim RN, BSN Entered By: Gretta Cool, BSN, RN, CWS, Kim on 04/16/2018 14:22:03 Prezioso, [name] ([id number]) -------------------------------------------------------------------------------- Lower Extremity Assessment Details Patient Name: [name], [name] Date of Service: 04/16/2018 1:45 PM Medical Record Number: [id number] Patient Account Number: [id number] Date of Birth/Sex: 05-11-1927 (85 y.o. F) Treating RN: Secundino Ginger Primary Care Angel Hobdy: Ria Bush Other Clinician: Referring Raahil Ong: Ria Bush Treating Karmello Abercrombie/Extender: Melburn Hake, HOYT Weeks in Treatment: 3 Edema Assessment Assessed: [Left: No] [Right: No] [Left: Edema] [Right: :] Calf Left: Right: Point of Measurement: 30 cm From Medial Instep cm 29 cm Ankle Left: Right: Point of Measurement: 10 cm From Medial Instep cm 19 cm Vascular Assessment Claudication: Claudication Assessment [Right:None] Pulses: Dorsalis Pedis Palpable: [Right:Yes] Posterior Tibial Extremity colors, hair growth, and conditions: Extremity Color: [Right:Red] Hair Growth on Extremity: [Right:No] Temperature of Extremity: [Right:Cool] Capillary Refill: [Right:< 3 seconds] Toe Nail Assessment Left: Right: Thick: Yes Discolored: No Deformed: No Improper Length and Hygiene: No Electronic Signature(s) Signed: 04/18/2018 9:53:12 AM By: Secundino Ginger Entered By: Secundino Ginger on 04/16/2018 14:06:32 Schier, Petronella C. ([id number]) -------------------------------------------------------------------------------- Multi Wound Chart Details Patient Name: [name], [name] Date of Service: 04/16/2018 1:45  PM Medical Record Number: [id number] Patient Account Number: [id number] Date of Birth/Sex: 1927-12-23 (85 y.o. F) Treating RN: Harold Barban Primary Care Bryn Perkin: Ria Bush Other Clinician: Referring Boysie Bonebrake: Ria Bush Treating Shellyann Wandrey/Extender: Melburn Hake, HOYT Weeks in Treatment: 3 Vital Signs Height(in): Pulse(bpm): 17 Weight(lbs): Blood Pressure(mmHg): 111/42 Body Mass Index(BMI): Temperature(F): 97.5 Respiratory Rate 18 (breaths/min): Photos: [8:No Photos] [N/A:N/A] Wound Location: [8:Right Lower Leg - Lateral] [N/A:N/A] Wounding Event: [8:Trauma] [N/A:N/A] Primary Etiology: [8:Diabetic Wound/Ulcer of the Lower Extremity] [N/A:N/A] Comorbid History: [8:Cataracts, Anemia, Arrhythmia, Coronary Artery Disease, Hypertension, Myocardial Infarction, Type II Diabetes, Osteoarthritis] [N/A:N/A] Date Acquired: [8:03/09/2018] [  N/A:N/A] Weeks of Treatment: [8:3] [N/A:N/A] Wound Status: [8:Open] [N/A:N/A] Measurements L x W x D [8:3x1.9x0.2] [N/A:N/A] (cm) Area (cm) : [8:4.477] [N/A:N/A] Volume (cm) : [8:0.895] [N/A:N/A] % Reduction in Area: [8:-137.50%] [N/A:N/A] % Reduction in Volume: [8:-58.40%] [N/A:N/A] Classification: [8:Grade 2] [N/A:N/A] Exudate Amount: [8:Medium] [N/A:N/A] Exudate Type: [8:Serous] [N/A:N/A] Exudate Color: [8:amber] [N/A:N/A] Wound Margin: [8:Flat and Intact] [N/A:N/A] Granulation Amount: [8:Small (1-33%)] [N/A:N/A] Necrotic Amount: [8:Large (67-100%)] [N/A:N/A] Exposed Structures: [8:Fat Layer (Subcutaneous Tissue) Exposed: Yes Fascia: No Tendon: No Muscle: No Joint: No Bone: No] [N/A:N/A] Epithelialization: [8:None] [N/A:N/A] Periwound Skin Texture: [8:Excoriation: No Induration: No Callus: No] [N/A:N/A] Crepitus: No Rash: No Scarring: No Periwound Skin Moisture: Dry/Scaly: Yes N/A N/A Maceration: No Periwound Skin Color: Erythema: Yes N/A N/A Atrophie Blanche: No Cyanosis: No Ecchymosis: No Hemosiderin Staining:  No Mottled: No Pallor: No Rubor: No Erythema Location: Circumferential N/A N/A Temperature: No Abnormality N/A N/A Tenderness on Palpation: Yes N/A N/A Wound Preparation: Ulcer Cleansing: N/A N/A Rinsed/Irrigated with Saline Topical Anesthetic Applied: Other: lidocaine 4% Treatment Notes Electronic Signature(s) Signed: 04/16/2018 3:50:02 PM By: Harold Barban Entered By: Harold Barban on 04/16/2018 14:12:17 Shartzer, [name] ([id number]) -------------------------------------------------------------------------------- [place] Details Patient Name: [name], [name] Date of Service: 04/16/2018 1:45 PM Medical Record Number: [id number] Patient Account Number: [id number] Date of Birth/Sex: 17-Dec-1927 (85 y.o. F) Treating RN: Harold Barban Primary Care Pocahontas Cohenour: Ria Bush Other Clinician: Referring Bich Mchaney: Ria Bush Treating Marchell Froman/Extender: Melburn Hake, HOYT Weeks in Treatment: 3 Active Inactive Abuse / Safety / Falls / Self Care Management Nursing Diagnoses: Potential for falls Goals: Patient will not experience any injury related to falls Date Initiated: 03/23/2018 Target Resolution Date: 06/09/2018 Goal Status: Active Interventions: Assess fall risk on admission and as needed Notes: Orientation to the Wound Care Program Nursing Diagnoses: Knowledge deficit related to the wound healing center program Goals: Patient/caregiver will verbalize understanding of the [place] Program Date Initiated: 03/23/2018 Target Resolution Date: 06/09/2018 Goal Status: Active Interventions: Provide education on orientation to the wound center Notes: Pain, Acute or Chronic Nursing Diagnoses: Pain, acute or chronic: actual or potential Goals: Patient will verbalize adequate pain control and receive pain control interventions during procedures as needed Date Initiated: 03/23/2018 Target Resolution Date: 06/09/2018 Goal Status:  Active Interventions: Assess comfort goal upon admission Emmick, Yamina C. ([id number]) Notes: Wound/Skin Impairment Nursing Diagnoses: Impaired tissue integrity Goals: Ulcer/skin breakdown will heal within 14 weeks Date Initiated: 03/23/2018 Target Resolution Date: 06/09/2018 Goal Status: Active Interventions: Assess patient/caregiver ability to obtain necessary supplies Assess patient/caregiver ability to perform ulcer/skin care regimen upon admission and as needed Assess ulceration(s) every visit Notes: Electronic Signature(s) Signed: 04/16/2018 3:50:02 PM By: Harold Barban Entered By: Harold Barban on 04/16/2018 14:12:09 [name], [name] ([id number]) -------------------------------------------------------------------------------- Pain Assessment Details Patient Name: [name], [name] Date of Service: 04/16/2018 1:45 PM Medical Record Number: [id number] Patient Account Number: [id number] Date of Birth/Sex: 12/16/1927 (85 y.o. F) Treating RN: Cornell Barman Primary Care Joelle Roswell: Ria Bush Other Clinician: Referring Trebor Galdamez: Ria Bush Treating Oluwaseyi Raffel/Extender: Melburn Hake, HOYT Weeks in Treatment: 3 Active Problems Location of Pain Severity and Description of Pain Patient Has [name] No Site Locations Pain Management and Medication Current Pain Management: Electronic Signature(s) Signed: 04/16/2018 3:03:36 PM By: Paulla Fore, RRT, CHT Signed: 04/16/2018 3:49:31 PM By: Gretta Cool, BSN, RN, CWS, Kim RN, BSN Entered By: Lorine Bears on 04/16/2018 13:47:38 [name], [name] ([id number]) -------------------------------------------------------------------------------- Patient/Caregiver Education Details Patient Name: Kirschenbaum, Nyasha C. Date of Service: 04/16/2018 1:45 PM Medical Record Number:  [id number] Patient Account Number: [id number] Date of Birth/Gender: [birth year] (85 y.o. F) Treating RN: Harold Barban Primary Care  Physician: Ria Bush Other Clinician: Referring Physician: Ria Bush Treating Physician/Extender: Sharalyn Ink in Treatment: 3 Education Assessment Education Provided To: Patient Education Topics Provided Wound/Skin Impairment: Handouts: Caring for Your Ulcer Methods: Demonstration, Explain/Verbal Responses: State content correctly Electronic Signature(s) Signed: 04/16/2018 3:50:02 PM By: Harold Barban Entered By: Harold Barban on 04/16/2018 14:12:35 [name], [name] ([id number]) -------------------------------------------------------------------------------- Wound Assessment Details Patient Name: [name], [name] Date of Service: 04/16/2018 1:45 PM Medical Record Number: [id number] Patient Account Number: [id number] Date of Birth/Sex: 1927/07/28 (85 y.o. F) Treating RN: Secundino Ginger Primary Care Jemario Poitras: Ria Bush Other Clinician: Referring Rachelanne Whidby: Ria Bush Treating Cayne Yom/Extender: STONE III, HOYT Weeks in Treatment: 3 Wound Status Wound Number: 8 Primary Diabetic Wound/Ulcer of the Lower Extremity Etiology: Wound Location: Right Lower Leg - Lateral Wound Open Wounding Event: Trauma Status: Date Acquired: 03/09/2018 Comorbid Cataracts, Anemia, Arrhythmia, Coronary Artery Weeks Of Treatment: 3 History: Disease, Hypertension, Myocardial Infarction, Clustered Wound: No Type II Diabetes, Osteoarthritis Photos Photo Uploaded By: Secundino Ginger on 04/16/2018 14:26:51 Wound Measurements Length: (cm) 3 Width: (cm) 1.9 Depth: (cm) 0.2 Area: (cm) 4.477 Volume: (cm) 0.895 % Reduction in Area: -137.5% % Reduction in Volume: -58.4% Epithelialization: None Tunneling: No Undermining: No Wound Description Classification: Grade 2 Foul Odor A Wound Margin: Flat and Intact Slough/Fibr Exudate Amount: Medium Exudate Type: Serous Exudate Color: amber fter Cleansing: No ino Yes Wound Bed Granulation Amount: Small (1-33%) Exposed  Structure Necrotic Amount: Large (67-100%) Fascia Exposed: No Necrotic Quality: Adherent Slough Fat Layer (Subcutaneous Tissue) Exposed: Yes Tendon Exposed: No Muscle Exposed: No Joint Exposed: No Bone Exposed: No Periwound Skin Texture Hardrick, Jadelynn C. ([id number]) Texture Color No Abnormalities Noted: No No Abnormalities Noted: No Callus: No Atrophie Blanche: No Crepitus: No Cyanosis: No Excoriation: No Ecchymosis: No Induration: No Erythema: Yes Rash: No Erythema Location: Circumferential Scarring: No Hemosiderin Staining: No Mottled: No Moisture Pallor: No No Abnormalities Noted: No Rubor: No Dry / Scaly: Yes Maceration: No Temperature / Pain Temperature: No Abnormality Tenderness on Palpation: Yes Wound Preparation Ulcer Cleansing: Rinsed/Irrigated with Saline Topical Anesthetic Applied: Other: lidocaine 4%, Treatment Notes Wound #8 (Right, Lateral Lower Leg) Notes santyl, abd, conform Electronic Signature(s) Signed: 04/18/2018 9:53:12 AM By: Secundino Ginger Entered By: Secundino Ginger on 04/16/2018 14:04:32 [name], [name] ([id number]) -------------------------------------------------------------------------------- Vitals Details Patient Name: [name], [name] Date of Service: 04/16/2018 1:45 PM Medical Record Number: [id number] Patient Account Number: [id number] Date of Birth/Sex: [birth year] (85 y.o. F) Treating RN: Cornell Barman Primary Care Arelia Volpe: Ria Bush Other Clinician: Referring Zlata Alcaide: Ria Bush Treating Hudsen Fei/Extender: Melburn Hake, HOYT Weeks in Treatment: 3 Vital Signs Time Taken: 13:47 Temperature (F): 97.5 Pulse (bpm): 61 Respiratory Rate (breaths/min): 18 Blood Pressure (mmHg): 111/42 Reference Range: 80 - 120 mg / dl Electronic Signature(s) Signed: 04/16/2018 3:03:36 PM By: Lorine Bears RCP, RRT, CHT Entered By: Lorine Bears on 04/16/2018 13:51:23

## 2018-04-20 DIAGNOSIS — L97211 Non-pressure chronic ulcer of right calf limited to breakdown of skin: Secondary | ICD-10-CM | POA: Diagnosis not present

## 2018-04-20 DIAGNOSIS — I872 Venous insufficiency (chronic) (peripheral): Secondary | ICD-10-CM | POA: Diagnosis not present

## 2018-04-20 DIAGNOSIS — E11622 Type 2 diabetes mellitus with other skin ulcer: Secondary | ICD-10-CM | POA: Diagnosis not present

## 2018-04-20 DIAGNOSIS — L97812 Non-pressure chronic ulcer of other part of right lower leg with fat layer exposed: Secondary | ICD-10-CM | POA: Diagnosis not present

## 2018-04-20 DIAGNOSIS — I2510 Atherosclerotic heart disease of native coronary artery without angina pectoris: Secondary | ICD-10-CM | POA: Diagnosis not present

## 2018-04-20 DIAGNOSIS — E1151 Type 2 diabetes mellitus with diabetic peripheral angiopathy without gangrene: Secondary | ICD-10-CM | POA: Diagnosis not present

## 2018-04-23 ENCOUNTER — Encounter: Payer: Medicare Other | Admitting: Physician Assistant

## 2018-04-23 DIAGNOSIS — L97812 Non-pressure chronic ulcer of other part of right lower leg with fat layer exposed: Secondary | ICD-10-CM | POA: Diagnosis not present

## 2018-04-23 DIAGNOSIS — I87331 Chronic venous hypertension (idiopathic) with ulcer and inflammation of right lower extremity: Secondary | ICD-10-CM | POA: Diagnosis not present

## 2018-04-23 DIAGNOSIS — E11622 Type 2 diabetes mellitus with other skin ulcer: Secondary | ICD-10-CM | POA: Diagnosis not present

## 2018-04-23 DIAGNOSIS — L03115 Cellulitis of right lower limb: Secondary | ICD-10-CM | POA: Diagnosis not present

## 2018-04-23 DIAGNOSIS — S80811S Abrasion, right lower leg, sequela: Secondary | ICD-10-CM | POA: Diagnosis not present

## 2018-04-24 ENCOUNTER — Ambulatory Visit
Admission: RE | Admit: 2018-04-24 | Discharge: 2018-04-24 | Disposition: A | Discharge status: Home / Self Care | Payer: Medicare Other | Source: Ambulatory Visit | Attending: Family Medicine | Admitting: Family Medicine

## 2018-04-24 DIAGNOSIS — L539 Erythematous condition, unspecified: Secondary | ICD-10-CM

## 2018-04-24 DIAGNOSIS — N6489 Other specified disorders of breast: Secondary | ICD-10-CM | POA: Diagnosis not present

## 2018-04-24 DIAGNOSIS — R922 Inconclusive mammogram: Secondary | ICD-10-CM | POA: Diagnosis not present

## 2018-04-24 DIAGNOSIS — N6311 Unspecified lump in the right breast, upper outer quadrant: Secondary | ICD-10-CM

## 2018-04-24 NOTE — Progress Notes (Signed)
SHAKITA, KEIR ([id number]) Visit Report for 04/23/2018 Arrival Information Details Patient Name: Coaxum, Lashaun C. Date of Service: 04/23/2018 1:00 PM Medical Record Number: [id number] Patient Account Number: [id number] Date of Birth/Sex: 12/23/1927 (85 y.o. F) Treating RN: Harold Barban Primary Care Griselda Bramblett: Ria Bush Other Clinician: Referring Zack Crager: Ria Bush Treating Shenouda Genova/Extender: Melburn Hake, HOYT Weeks in Treatment: 4 Visit Information History Since Last Visit Added or deleted any medications: Yes Patient Arrived: Walker Any new allergies or adverse reactions: No Arrival Time: 13:03 Had a fall or experienced change in No Accompanied By: self activities of daily living that may affect Transfer Assistance: None risk of falls: Patient Identification Verified: Yes Signs or symptoms of abuse/neglect since last visito No Secondary Verification Process Completed: Yes Hospitalized since last visit: No Implantable device outside of the clinic excluding No cellular tissue based products placed in the center since last visit: Has Dressing in Place as Prescribed: Yes Pain Present Now: Yes Electronic Signature(s) Signed: 04/23/2018 3:56:35 PM By: Lorine Bears RCP, RRT, CHT Entered By: Lorine Bears on 04/23/2018 13:04:09 Holaway, [name] ([id number]) -------------------------------------------------------------------------------- Encounter Discharge Information Details Patient Name: Folger, Stanisha C. Date of Service: 04/23/2018 1:00 PM Medical Record Number: [id number] Patient Account Number: [id number] Date of Birth/Sex: July 29, 1927 (85 y.o. F) Treating RN: Montey Hora Primary Care Ashli Selders: Ria Bush Other Clinician: Referring Christohper Dube: Ria Bush Treating Caitriona Sundquist/Extender: Melburn Hake, HOYT Weeks in Treatment: 4 Encounter Discharge Information Items Post Procedure Vitals Discharge Condition:  Stable Temperature (F): 97.9 Ambulatory Status: Walker Pulse (bpm): 66 Discharge Destination: Home Respiratory Rate (breaths/min): 18 Transportation: Private Auto Blood Pressure (mmHg): 132/78 Accompanied By: self Schedule Follow-up Appointment: Yes Clinical Summary of Care: Electronic Signature(s) Signed: 04/23/2018 3:56:08 PM By: Montey Hora Entered By: Montey Hora on 04/23/2018 15:56:07 Chopp, [name] ([id number]) -------------------------------------------------------------------------------- Lower Extremity Assessment Details Patient Name: Ozment, Bennye C. Date of Service: 04/23/2018 1:00 PM Medical Record Number: [id number] Patient Account Number: [id number] Date of Birth/Sex: 1928-02-22 (85 y.o. F) Treating RN: Secundino Ginger Primary Care Cheikh Bramble: Ria Bush Other Clinician: Referring Amna Welker: Ria Bush Treating Aalaya Yadao/Extender: Melburn Hake, HOYT Weeks in Treatment: 4 Edema Assessment Assessed: [Left: No] [Right: No] Edema: [Left: N] [Right: o] Calf Left: Right: Point of Measurement: 30 cm From Medial Instep cm 28 cm Ankle Left: Right: Point of Measurement: 10 cm From Medial Instep cm 18 cm Vascular Assessment Claudication: Claudication Assessment [Right:None] Pulses: Dorsalis Pedis Palpable: [Right:Yes] Posterior Tibial Extremity colors, hair growth, and conditions: Extremity Color: [Right:Red] Hair Growth on Extremity: [Right:No] Temperature of Extremity: [Right:Warm] Capillary Refill: [Right:< 3 seconds] Toe Nail Assessment Left: Right: Thick: Yes Discolored: Yes Deformed: Yes Electronic Signature(s) Signed: 04/23/2018 1:48:10 PM By: Secundino Ginger Entered By: Secundino Ginger on 04/23/2018 13:16:18 Weinheimer, Saffron C. ([id number]) -------------------------------------------------------------------------------- Multi Wound Chart Details Patient Name: Tinnell, Classie C. Date of Service: 04/23/2018 1:00 PM Medical Record Number:  [id number] Patient Account Number: [id number] Date of Birth/Sex: February 28, 1928 (85 y.o. F) Treating RN: Harold Barban Primary Care Liel Rudden: Ria Bush Other Clinician: Referring Keeara Frees: Ria Bush Treating Kurstyn Larios/Extender: Melburn Hake, HOYT Weeks in Treatment: 4 Vital Signs Height(in): Pulse(bpm): 18 Weight(lbs): Blood Pressure(mmHg): 132/78 Body Mass Index(BMI): Temperature(F): 97.9 Respiratory Rate 16 (breaths/min): Photos: [N/A:N/A] Wound Location: Right Lower Leg - Lateral N/A N/A Wounding Event: Trauma N/A N/A Primary Etiology: Diabetic Wound/Ulcer of the N/A N/A Lower Extremity Comorbid History: Cataracts, Anemia, N/A N/A Arrhythmia, Coronary Artery Disease, Hypertension, Myocardial Infarction, Type II Diabetes, Osteoarthritis Date Acquired: 03/09/2018 N/A N/A Weeks of Treatment: 4 N/A N/A Wound Status:  Open N/A N/A Measurements L x W x D 3x1.5x0.2 N/A N/A (cm) Area (cm) : 3.534 N/A N/A Volume (cm) : 0.707 N/A N/A % Reduction in Area: -87.50% N/A N/A % Reduction in Volume: -25.10% N/A N/A Classification: Grade 2 N/A N/A Exudate Amount: Medium N/A N/A Exudate Type: Serous N/A N/A Exudate Color: amber N/A N/A Wound Margin: Flat and Intact N/A N/A Granulation Amount: Small (1-33%) N/A N/A Necrotic Amount: Large (67-100%) N/A N/A Exposed Structures: Fat Layer (Subcutaneous N/A N/A Tissue) Exposed: Yes Fascia: No Tendon: No Grudzien, Assata C. ([id number]) Muscle: No Joint: No Bone: No Epithelialization: None N/A N/A Periwound Skin Texture: Excoriation: No N/A N/A Induration: No Callus: No Crepitus: No Rash: No Scarring: No Periwound Skin Moisture: Dry/Scaly: Yes N/A N/A Maceration: No Periwound Skin Color: Erythema: Yes N/A N/A Atrophie Blanche: No Cyanosis: No Ecchymosis: No Hemosiderin Staining: No Mottled: No Pallor: No Rubor: No Erythema Location: Circumferential N/A N/A Temperature: No Abnormality N/A N/A Tenderness on  Palpation: Yes N/A N/A Wound Preparation: Ulcer Cleansing: N/A N/A Rinsed/Irrigated with Saline Topical Anesthetic Applied: Other: lidocaine 4% Treatment Notes Electronic Signature(s) Signed: 04/23/2018 5:13:49 PM By: Harold Barban Entered By: Harold Barban on 04/23/2018 13:37:06 Marty, [name] ([id number]) -------------------------------------------------------------------------------- [place] Details Patient Name: Viera, Rosanna C. Date of Service: 04/23/2018 1:00 PM Medical Record Number: [id number] Patient Account Number: [id number] Date of Birth/Sex: [birth year] (85 y.o. F) Treating RN: Harold Barban Primary Care Consandra Laske: Ria Bush Other Clinician: Referring Carnetta Losada: Ria Bush Treating Jamarea Selner/Extender: Melburn Hake, HOYT Weeks in Treatment: 4 Active Inactive Abuse / Safety / Falls / Self Care Management Nursing Diagnoses: Potential for falls Goals: Patient will not experience any injury related to falls Date Initiated: 03/23/2018 Target Resolution Date: 06/09/2018 Goal Status: Active Interventions: Assess fall risk on admission and as needed Notes: Orientation to the Wound Care Program Nursing Diagnoses: Knowledge deficit related to the wound healing center program Goals: Patient/caregiver will verbalize understanding of the [facility] Program Date Initiated: 03/23/2018 Target Resolution Date: 06/09/2018 Goal Status: Active Interventions: Provide education on orientation to the wound center Notes: Pain, Acute or Chronic Nursing Diagnoses: Pain, acute or chronic: actual or potential Goals: Patient will verbalize adequate pain control and receive pain control interventions during procedures as needed Date Initiated: 03/23/2018 Target Resolution Date: 06/09/2018 Goal Status: Active Interventions: Assess comfort goal upon admission Meskill, Eowyn C. ([id number]) Notes: Wound/Skin Impairment Nursing  Diagnoses: Impaired tissue integrity Goals: Ulcer/skin breakdown will heal within 14 weeks Date Initiated: 03/23/2018 Target Resolution Date: 06/09/2018 Goal Status: Active Interventions: Assess patient/caregiver ability to obtain necessary supplies Assess patient/caregiver ability to perform ulcer/skin care regimen upon admission and as needed Assess ulceration(s) every visit Notes: Electronic Signature(s) Signed: 04/23/2018 5:13:49 PM By: Harold Barban Entered By: Harold Barban on 04/23/2018 13:36:58 Waibel, Del C. ([id number]) -------------------------------------------------------------------------------- Pain Assessment Details Patient Name: Boughner, Tandi C. Date of Service: 04/23/2018 1:00 PM Medical Record Number: [id number] Patient Account Number: [id number] Date of Birth/Sex: 1928/02/26 (85 y.o. F) Treating RN: Harold Barban Primary Care Perrie Ragin: Ria Bush Other Clinician: Referring Yeudiel Mateo: Ria Bush Treating Tu Shimmel/Extender: Melburn Hake, HOYT Weeks in Treatment: 4 Active Problems Location of Pain Severity and Description of Pain Patient Has [name] Yes Site Locations Rate the pain. Current Pain Level: 7 Pain Management and Medication Current Pain Management: Electronic Signature(s) Signed: 04/23/2018 3:56:35 PM By: Lorine Bears RCP, RRT, CHT Signed: 04/23/2018 5:13:49 PM By: Harold Barban Entered By: Lorine Bears on 04/23/2018 13:04:20 Mathieson, [name] ([id number]) -------------------------------------------------------------------------------- Patient/Caregiver Education Details  Patient Name: Hollenbach, Phillis C. Date of Service: 04/23/2018 1:00 PM Medical Record Number: [id number] Patient Account Number: [id number] Date of Birth/Gender: Jan 24, 1928 (85 y.o. F) Treating RN: Harold Barban Primary Care Physician: Ria Bush Other Clinician: Referring Physician: Ria Bush Treating  Physician/Extender: Sharalyn Ink in Treatment: 4 Education Assessment Education Provided To: Patient Education Topics Provided Wound/Skin Impairment: Handouts: Caring for Your Ulcer Methods: Demonstration, Explain/Verbal Responses: State content correctly Electronic Signature(s) Signed: 04/23/2018 5:13:49 PM By: Harold Barban Entered By: Harold Barban on 04/23/2018 13:37:23 Latini, Gerald C. ([id number]) -------------------------------------------------------------------------------- Wound Assessment Details Patient Name: Alcaraz, Alverda C. Date of Service: 04/23/2018 1:00 PM Medical Record Number: [id number] Patient Account Number: [id number] Date of Birth/Sex: 07-25-1927 (85 y.o. F) Treating RN: Secundino Ginger Primary Care Averianna Brugger: Ria Bush Other Clinician: Referring Nikki Rusnak: Ria Bush Treating Jaden Abreu/Extender: STONE III, HOYT Weeks in Treatment: 4 Wound Status Wound Number: 8 Primary Diabetic Wound/Ulcer of the Lower Extremity Etiology: Wound Location: Right Lower Leg - Lateral Wound Open Wounding Event: Trauma Status: Date Acquired: 03/09/2018 Comorbid Cataracts, Anemia, Arrhythmia, Coronary Artery Weeks Of Treatment: 4 History: Disease, Hypertension, Myocardial Infarction, Clustered Wound: No Type II Diabetes, Osteoarthritis Photos Photo Uploaded By: Secundino Ginger on 04/23/2018 13:20:09 Wound Measurements Length: (cm) 3 Width: (cm) 1.5 Depth: (cm) 0.2 Area: (cm) 3.534 Volume: (cm) 0.707 % Reduction in Area: -87.5% % Reduction in Volume: -25.1% Epithelialization: None Tunneling: No Undermining: No Wound Description Classification: Grade 2 Foul Odor A Wound Margin: Flat and Intact Slough/Fibr Exudate Amount: Medium Exudate Type: Serous Exudate Color: amber fter Cleansing: No ino Yes Wound Bed Granulation Amount: Small (1-33%) Exposed Structure Necrotic Amount: Large (67-100%) Fascia Exposed: No Necrotic Quality: Adherent  Slough Fat Layer (Subcutaneous Tissue) Exposed: Yes Tendon Exposed: No Muscle Exposed: No Joint Exposed: No Bone Exposed: No Periwound Skin Texture Sorey, Kaylei C. ([id number]) Texture Color No Abnormalities Noted: No No Abnormalities Noted: No Callus: No Atrophie Blanche: No Crepitus: No Cyanosis: No Excoriation: No Ecchymosis: No Induration: No Erythema: Yes Rash: No Erythema Location: Circumferential Scarring: No Hemosiderin Staining: No Mottled: No Moisture Pallor: No No Abnormalities Noted: No Rubor: No Dry / Scaly: Yes Maceration: No Temperature / Pain Temperature: No Abnormality Tenderness on Palpation: Yes Wound Preparation Ulcer Cleansing: Rinsed/Irrigated with Saline Topical Anesthetic Applied: Other: lidocaine 4%, Treatment Notes Wound #8 (Right, Lateral Lower Leg) Notes santyl, abd, conform Electronic Signature(s) Signed: 04/23/2018 1:48:10 PM By: Secundino Ginger Entered By: Secundino Ginger on 04/23/2018 13:14:52 Sherley, Charrise C. ([id number]) -------------------------------------------------------------------------------- Vitals Details Patient Name: Lizardo, Sareen C. Date of Service: 04/23/2018 1:00 PM Medical Record Number: [id number] Patient Account Number: [id number] Date of Birth/Sex: [birth year] (85 y.o. F) Treating RN: Harold Barban Primary Care Cantrell Larouche: Ria Bush Other Clinician: Referring Urie [name]: Ria Bush Treating Terrisha Lopata/Extender: STONE III, HOYT Weeks in Treatment: 4 Vital Signs Time Taken: 13:05 Temperature (F): 97.9 Pulse (bpm): 66 Respiratory Rate (breaths/min): 16 Blood Pressure (mmHg): 132/78 Reference Range: 80 - 120 mg / dl Electronic Signature(s) Signed: 04/23/2018 1:48:10 PM By: Secundino Ginger Entered BySecundino Ginger on 04/23/2018 13:09:18

## 2018-04-24 NOTE — Progress Notes (Signed)
TERENA, BOHAN ([id number]) Visit Report for 04/23/2018 Chief Complaint Document Details Patient Name: [name], [name] Date of Service: 04/23/2018 1:00 PM Medical Record Number: [id number] Patient Account Number: [id number] Date of Birth/Sex: [birth year] (85 y.o. F) Treating RN: Harold Barban Primary Care Provider: Ria Bush Other Clinician: Referring Provider: Ria Bush Treating Provider/Extender: Melburn Hake, Adalay Azucena Weeks in Treatment: 4 Information Obtained from: Patient Chief Complaint RLE wounds Electronic Signature(s) Signed: 04/23/2018 9:58:15 PM By: Worthy Keeler PA-C Entered By: Worthy Keeler on 04/23/2018 13:10:20 [name] ([id number]) -------------------------------------------------------------------------------- Debridement Details Patient Name: [name], [name] Date of Service: 04/23/2018 1:00 PM Medical Record Number: [id number] Patient Account Number: [id number] Date of Birth/Sex: 06-07-1927 (85 y.o. F) Treating RN: Harold Barban Primary Care Provider: Ria Bush Other Clinician: Referring Provider: Ria Bush Treating Provider/Extender: Melburn Hake, Viha Kriegel Weeks in Treatment: 4 Debridement Performed for Wound #8 Right,Lateral Lower Leg Assessment: Performed By: Physician STONE III, [place] Vandergriff E., PA-C Debridement Type: Debridement Severity of Tissue Pre Fat layer exposed Debridement: Level of Consciousness (Pre- Awake and Alert procedure): Pre-procedure Verification/Time Yes - 13:40 Out Taken: Start Time: 13:40 Pain Control: Lidocaine Total Area Debrided (L x W): 3 (cm) x 1.5 (cm) = 4.5 (cm) Tissue and other material Non-Viable, Slough, Subcutaneous, Slough debrided: Level: Skin/Subcutaneous Tissue Debridement Description: Excisional Instrument: Curette Bleeding: Minimum Hemostasis Achieved: Pressure End Time: 13:42 Procedural Pain: 0 Post Procedural Pain: 0 Response to Treatment: Procedure was tolerated  well Level of Consciousness Awake and Alert (Post-procedure): Post Debridement Measurements of Total Wound Length: (cm) 3 Width: (cm) 1.5 Depth: (cm) 0.2 Volume: (cm) 0.707 Character of Wound/Ulcer Post Debridement: Requires Further Debridement Severity of Tissue Post Debridement: Fat layer exposed Post Procedure Diagnosis Same as Pre-procedure Electronic Signature(s) Signed: 04/23/2018 5:13:49 PM By: Harold Barban Signed: 04/23/2018 9:58:15 PM By: Worthy Keeler PA-C Entered By: Harold Barban on 04/23/2018 13:42:59 [name], [name] ([id number]) -------------------------------------------------------------------------------- HPI Details Patient Name: [name], [name] Date of Service: 04/23/2018 1:00 PM Medical Record Number: [id number] Patient Account Number: [id number] Date of Birth/Sex: 12-27-1927 (85 y.o. F) Treating RN: Harold Barban Primary Care Provider: Ria Bush Other Clinician: Referring Provider: Ria Bush Treating Provider/Extender: Melburn Hake, Johnthan Axtman Weeks in Treatment: 4 History of Present Illness HPI Description: Readmission: 03/23/18 on evaluation today patient presents for readmission entire clinic concerning issues that she has been having for the past couple of weeks in regard to her right lower extremity. The medial injury actually occurred when her dog tried to take a blanket from her and subsequently scratched her with his toenail. Subsequently the lateral injury was a wound where she dropped a knife in her kitchen and hit the side of her leg. With that being said both have been open for some time and home health nurse that has been coming out marked for her where the redness was several days ago. Nonetheless this has spread will be on the margin of what was marked. She was placed on Keflex which was started two days ago but at this point it does not seem to have helped with any improvement in the overall appearance of the wounds. No  fevers, chills, nausea, or vomiting noted at this time. 04/02/18 Seen today for follow up and management of right lower extremity wounds. Right lateral wound with adherent slough. She is complaining of pain to touch which limited exam. Recently treated in the ED for cellulitis and wound culture positive for MRSA. Completed a dose of Vancomycin and transitioned to doxycycline by mouth. Erythema remains  present with improvement since last visit. Right medial wound with scab present. No fevers, chills, nausea, or vomiting noted at this time. 04/10/18 on evaluation today patient appears to be doing a Mckenna better in regard to her right lateral lower [name]. The slough does seem to be coming off although it is slow from the wound bed. Fortunately there does not appear to be any evidence of infection at this time which is good news. With that being said I do believe that she is going to require longer treatment with the Santyl simply due to the fact that I'm not able to really debride the wound due to the pain that she is experiencing. 04/16/18 upon evaluation today patient's wound bed currently on the lateral lower extremity on the right appears to be doing a Kinzler better at this point. We are utilizing sensible currently. Overall I'm very pleased with how things are progressing although there still is some work to do before this will be completely healed. No fevers, chills, nausea, or vomiting noted at this time. 04/23/18 on evaluation today patient appears to be doing about the same at this point maybe just a Hermance bit smaller than last week's evaluation. She continues to have some slough buildup on the surface of the wound and has very Lundstrom granulation unfortunately. This seems to be very slow moving I'm getting somewhat concerned about the possibility of her having decreased blood flow which is leading to her subsequently not being able to heal this area as quickly as she shut  otherwise. She is currently utilizing Santyl. Electronic Signature(s) Signed: 04/23/2018 9:58:15 PM By: Worthy Keeler PA-C Entered By: Worthy Keeler on 04/23/2018 13:57:14 [place], [name] ([id number]) -------------------------------------------------------------------------------- Physical Exam Details Patient Name: [name], [name] Date of Service: 04/23/2018 1:00 PM Medical Record Number: [id number] Patient Account Number: [id number] Date of Birth/Sex: 05-23-1927 (85 y.o. F) Treating RN: Harold Barban Primary Care Provider: Ria Bush Other Clinician: Referring Provider: Ria Bush Treating Provider/Extender: STONE III, Carmon Brigandi Weeks in Treatment: 4 Constitutional Well-nourished and well-hydrated in no acute distress. Respiratory normal breathing without difficulty. clear to auscultation bilaterally. Cardiovascular regular rate and rhythm with normal S1, S2. Psychiatric this patient is able to make decisions and demonstrates good insight into disease process. Alert and Oriented x 3. pleasant and cooperative. Notes Patient's wound bed currently shows some granulation although in general this appears to be somewhat lacking in this way there is some connective tissue noted at this point. Overall I don't think this is doing horribly definitely does not appear to be showing signs of infection but being that it's healing so slowly I'm concerned about the arterial flow to the region. Light debridement was performed to clear away some Slough from the surface of the wound during her evaluation and management today. Electronic Signature(s) Signed: 04/23/2018 9:58:15 PM By: Worthy Keeler PA-C Entered By: Worthy Keeler on 04/23/2018 13:58:06 Dosch, [name] ([id number]) -------------------------------------------------------------------------------- Physician Orders Details Patient Name: [name], [name] Date of Service: 04/23/2018 1:00 PM Medical Record Number:  [id number] Patient Account Number: [id number] Date of Birth/Sex: Sep 18, 1927 (85 y.o. F) Treating RN: Harold Barban Primary Care Provider: Ria Bush Other Clinician: Referring Provider: Ria Bush Treating Provider/Extender: Melburn Hake, Gianina Olinde Weeks in Treatment: 4 Verbal / Phone Orders: No Diagnosis Coding ICD-10 Coding Code Description I87.331 Chronic venous hypertension (idiopathic) with ulcer and inflammation of right lower extremity E11.622 Type 2 diabetes mellitus with other skin ulcer L97.812 Non-pressure chronic ulcer of other part of right  lower leg with fat layer exposed L03.115 Cellulitis of right lower limb Wound Cleansing Wound #8 Right,Lateral Lower Leg o Clean wound with Normal Saline. Primary Wound Dressing Wound #8 Right,Lateral Lower Leg o Santyl Ointment Secondary Dressing Wound #8 Right,Lateral Lower Leg o ABD and Kerlix/Conform - secured with netting Dressing Change Frequency Wound #8 Right,Lateral Lower Leg o Change dressing every day. Follow-up Appointments Wound #8 Right,Lateral Lower Leg o Return Appointment in 1 week. Edema Control Wound #8 Right,Lateral Lower Leg o Elevate legs to the level of the heart and pump ankles as often as possible Home Health Wound #8 Right,Lateral Lower Leg o [place] Nurse may visit PRN to address patientos wound care needs. o FACE TO FACE ENCOUNTER: MEDICARE and MEDICAID PATIENTS: I certify that this patient is under my care and that I had a face-to-face encounter that meets the physician face-to-face encounter requirements with this patient on this date. The encounter with the patient was in whole or in part for the following MEDICAL CONDITION: (primary reason for [place]) MEDICAL NECESSITY: I certify, that based on my findings, NURSING services are a medically necessary home health service. HOME BOUND STATUS: I certify that my clinical findings  support that this patient is homebound (i.e., Due to illness or injury, pt requires aid of Maslanka, Reylynn C. ([id number]) supportive devices such as crutches, cane, wheelchairs, walkers, the use of special transportation or the assistance of another person to leave their place of residence. There is a normal inability to leave the home and doing so requires considerable and taxing effort. Other absences are for medical reasons / religious services and are infrequent or of short duration when for other reasons). o If current dressing causes regression in wound condition, may D/C ordered dressing product/s and apply Normal Saline Moist Dressing daily until next [place] / Other MD appointment. [place] of regression in wound condition at [phone]. o Please direct any NON-WOUND related issues/requests for orders to patient's Primary Care Physician Medications-please add to medication list. Wound #8 Right,Lateral Lower Leg o Santyl Enzymatic Ointment Consults o Vascular - Arterial Study to include ABI's and TBI's, bilateral. Electronic Signature(s) Signed: 04/23/2018 5:13:49 PM By: Harold Barban Signed: 04/23/2018 9:58:15 PM By: Worthy Keeler PA-C Entered By: Harold Barban on 04/23/2018 13:52:09 [name], [name] ([id number]) -------------------------------------------------------------------------------- Problem List Details Patient Name: [name], [name] Date of Service: 04/23/2018 1:00 PM Medical Record Number: [id number] Patient Account Number: [id number] Date of Birth/Sex: 1928/01/26 (85 y.o. F) Treating RN: Harold Barban Primary Care Provider: Ria Bush Other Clinician: Referring Provider: Ria Bush Treating Provider/Extender: Melburn Hake, Gareld Obrecht Weeks in Treatment: 4 Active Problems ICD-10 Evaluated Encounter Code Description Active Date Today Diagnosis I87.331 Chronic venous hypertension (idiopathic) with ulcer and  03/23/2018 No Yes inflammation of right lower extremity E11.622 Type 2 diabetes mellitus with other skin ulcer 03/23/2018 No Yes L97.812 Non-pressure chronic ulcer of other part of right lower leg 03/23/2018 No Yes with fat layer exposed L03.115 Cellulitis of right lower limb 03/23/2018 No Yes Inactive Problems Resolved Problems Electronic Signature(s) Signed: 04/23/2018 9:58:15 PM By: Worthy Keeler PA-C Entered By: Worthy Keeler on 04/23/2018 13:10:15 [place]illo, [name] ([id number]) -------------------------------------------------------------------------------- Progress Note Details Patient Name: [name], [name] Date of Service: 04/23/2018 1:00 PM Medical Record Number: [id number] Patient Account Number: [id number] Date of Birth/Sex: 12/15/1927 (85 y.o. F) Treating RN: Harold Barban Primary Care Provider: Ria Bush Other Clinician: Referring Provider: Ria Bush Treating Provider/Extender: Joaquim Lai  III, Lucila Klecka Weeks in Treatment: 4 Subjective Chief Complaint Information obtained from Patient RLE wounds History of Present Illness (HPI) Readmission: 03/23/18 on evaluation today patient presents for readmission entire clinic concerning issues that she has been having for the past couple of weeks in regard to her right lower extremity. The medial injury actually occurred when her dog tried to take a blanket from her and subsequently scratched her with his toenail. Subsequently the lateral injury was a wound where she dropped a knife in her kitchen and hit the side of her leg. With that being said both have been open for some time and home health nurse that has been coming out marked for her where the redness was several days ago. Nonetheless this has spread will be on the margin of what was marked. She was placed on Keflex which was started two days ago but at this point it does not seem to have helped with any improvement in the overall appearance of the wounds. No  fevers, chills, nausea, or vomiting noted at this time. 04/02/18 Seen today for follow up and management of right lower extremity wounds. Right lateral wound with adherent slough. She is complaining of pain to touch which limited exam. Recently treated in the ED for cellulitis and wound culture positive for MRSA. Completed a dose of Vancomycin and transitioned to doxycycline by mouth. Erythema remains present with improvement since last visit. Right medial wound with scab present. No fevers, chills, nausea, or vomiting noted at this time. 04/10/18 on evaluation today patient appears to be doing a Semper better in regard to her right lateral lower [name]. The slough does seem to be coming off although it is slow from the wound bed. Fortunately there does not appear to be any evidence of infection at this time which is good news. With that being said I do believe that she is going to require longer treatment with the Santyl simply due to the fact that I'm not able to really debride the wound due to the pain that she is experiencing. 04/16/18 upon evaluation today patient's wound bed currently on the lateral lower extremity on the right appears to be doing a Sundberg better at this point. We are utilizing sensible currently. Overall I'm very pleased with how things are progressing although there still is some work to do before this will be completely healed. No fevers, chills, nausea, or vomiting noted at this time. 04/23/18 on evaluation today patient appears to be doing about the same at this point maybe just a Harms bit smaller than last week's evaluation. She continues to have some slough buildup on the surface of the wound and has very Gaydos granulation unfortunately. This seems to be very slow moving I'm getting somewhat concerned about the possibility of her having decreased blood flow which is leading to her subsequently not being able to heal this area as quickly as she shut  otherwise. She is currently utilizing Santyl. Patient History Information obtained from Patient. Family History Diabetes - Father,Siblings,Child, Hypertension - Child, Lung Disease - Child, Stroke - Mother, No family history of Cancer, Heart Disease, Kidney Disease, Seizures, Thyroid Problems. [place], LANGSTAFF ([id number]) Social History Former smoker, Marital Status - Single, Alcohol Use - Never, Drug Use - No History, Caffeine Use - Daily. Medical And Surgical History Notes Constitutional Symptoms (General Health) High Blood Pressure, open heart surgery, gall stone, hysterectomy; Fluid pill, Anxiety Review of Systems (ROS) Constitutional Symptoms (General Health) Denies complaints or symptoms of Fever, Chills.  Respiratory The patient has no complaints or symptoms. Cardiovascular The patient has no complaints or symptoms. Psychiatric The patient has no complaints or symptoms. Objective Constitutional Well-nourished and well-hydrated in no acute distress. Vitals Time Taken: 1:05 PM, Temperature: 97.9 F, Pulse: 66 bpm, Respiratory Rate: 16 breaths/min, Blood Pressure: 132/78 mmHg. Respiratory normal breathing without difficulty. clear to auscultation bilaterally. Cardiovascular regular rate and rhythm with normal S1, S2. Psychiatric this patient is able to make decisions and demonstrates good insight into disease process. Alert and Oriented x 3. pleasant and cooperative. General Notes: Patient's wound bed currently shows some granulation although in general this appears to be somewhat lacking in this way there is some connective tissue noted at this point. Overall I don't think this is doing horribly definitely does not appear to be showing signs of infection but being that it's healing so slowly I'm concerned about the arterial flow to the region. Light debridement was performed to clear away some Slough from the surface of the wound during her evaluation and management  today. Integumentary (Hair, Skin) Wound #8 status is Open. Original cause of wound was Trauma. The wound is located on the Right,Lateral Lower Leg. The wound measures 3cm length x 1.5cm width x 0.2cm depth; 3.534cm^2 area and 0.707cm^3 volume. There is Fat Layer (Subcutaneous Tissue) Exposed exposed. There is no tunneling or undermining noted. There is a medium amount of serous drainage noted. The wound margin is flat and intact. There is small (1-33%) granulation within the wound bed. There is a large (67-100%) amount of necrotic tissue within the wound bed including Adherent Slough. The periwound skin appearance exhibited: Dry/Scaly, Erythema. The periwound skin appearance did not exhibit: Callus, Crepitus, Excoriation, Induration, Rash, Scarring, Maceration, Atrophie Blanche, Cyanosis, Ecchymosis, Hemosiderin Staining, Mottled, Pallor, Rubor. The [name], [name] ([id number]) surrounding wound skin color is noted with erythema which is circumferential. Periwound temperature was noted as No Abnormality. The periwound has tenderness on palpation. Assessment Active Problems ICD-10 Chronic venous hypertension (idiopathic) with ulcer and inflammation of right lower extremity Type 2 diabetes mellitus with other skin ulcer Non-pressure chronic ulcer of other part of right lower leg with fat layer exposed Cellulitis of right lower limb Procedures Wound #8 Pre-procedure diagnosis of Wound #8 is a Diabetic Wound/Ulcer of the Lower Extremity located on the Right,Lateral Lower Leg .Severity of Tissue Pre Debridement is: Fat layer exposed. There was a Excisional Skin/Subcutaneous Tissue Debridement with a total area of 4.5 sq cm performed by STONE III, Ronica Vivian E., PA-C. With the following instrument(s): Curette to remove Non-Viable tissue/material. Material removed includes Subcutaneous Tissue and Slough and after achieving pain control using Lidocaine. No specimens were taken. A time out was conducted  at 13:40, prior to the start of the procedure. A Minimum amount of bleeding was controlled with Pressure. The procedure was tolerated well with a pain level of 0 throughout and a pain level of 0 following the procedure. Post Debridement Measurements: 3cm length x 1.5cm width x 0.2cm depth; 0.707cm^3 volume. Character of Wound/Ulcer Post Debridement requires further debridement. Severity of Tissue Post Debridement is: Fat layer exposed. Post procedure Diagnosis Wound #8: Same as Pre-Procedure Plan Wound Cleansing: Wound #8 Right,Lateral Lower Leg: Clean wound with Normal Saline. Primary Wound Dressing: Wound #8 Right,Lateral Lower Leg: Santyl Ointment Secondary Dressing: Wound #8 Right,Lateral Lower Leg: ABD and Kerlix/Conform - secured with netting Dressing Change Frequency: Wound #8 Right,Lateral Lower Leg: Change dressing every day. Follow-up Appointments: Wound #8 Right,Lateral Lower Leg: Return Appointment in 1 week.  Printup, [name] ([id number]) Edema Control: Wound #8 Right,Lateral Lower Leg: Elevate legs to the level of the heart and pump ankles as often as possible Home Health: Wound #8 Right,Lateral Lower Leg: [place] Nurse may visit PRN to address patient s wound care needs. FACE TO FACE ENCOUNTER: MEDICARE and MEDICAID PATIENTS: I certify that this patient is under my care and that I had a face-to-face encounter that meets the physician face-to-face encounter requirements with this patient on this date. The encounter with the patient was in whole or in part for the following MEDICAL CONDITION: (primary reason for [place]) MEDICAL NECESSITY: I certify, that based on my findings, NURSING services are a medically necessary home health service. HOME BOUND STATUS: I certify that my clinical findings support that this patient is homebound (i.e., Due to illness or injury, pt requires aid of supportive devices such as crutches, cane,  wheelchairs, walkers, the use of special transportation or the assistance of another person to leave their place of residence. There is a normal inability to leave the home and doing so requires considerable and taxing effort. Other absences are for medical reasons / religious services and are infrequent or of short duration when for other reasons). If current dressing causes regression in wound condition, may D/C ordered dressing product/s and apply Normal Saline Moist Dressing daily until next [place] / Other MD appointment. [place] of regression in wound condition at [phone]. Please direct any NON-WOUND related issues/requests for orders to patient's Primary Care Physician Medications-please add to medication list.: Wound #8 Right,Lateral Lower Leg: Santyl Enzymatic Ointment Consults ordered were: Vascular - Arterial Study to include ABI's and TBI's, bilateral. At this point my suggestion is gonna be that we go ahead and see about getting her set up for a referral to vascular for arterial studies with ABI and TBI. She is in agreement with this. If anything changes or worsens she will contact the office and let me know. Otherwise we will see what the study shows and make some determinations going forward based on that finding as to what we need to do next. Please see above for specific wound care orders. We will see patient for re-evaluation in 1 week(s) here in the clinic. If anything worsens or changes patient will contact our office for additional recommendations. Electronic Signature(s) Signed: 04/23/2018 9:58:15 PM By: Worthy Keeler PA-C Entered By: Worthy Keeler on 04/23/2018 13:58:37 [place]igan, [name] ([id number]) -------------------------------------------------------------------------------- ROS/PFSH Details Patient Name: Cleaves, Sya C. Date of Service: 04/23/2018 1:00 PM Medical Record Number: [id number] Patient Account Number:  [id number] Date of Birth/Sex: 1927/06/20 (85 y.o. F) Treating RN: Harold Barban Primary Care Provider: Ria Bush Other Clinician: Referring Provider: Ria Bush Treating Provider/Extender: Melburn Hake, Lauralee Waters Weeks in Treatment: 4 Information Obtained From Patient Wound History Constitutional Symptoms (General Health) Complaints and Symptoms: Negative for: Fever; Chills Medical History: Past Medical History Notes: High Blood Pressure, open heart surgery, gall stone, hysterectomy; Fluid pill, Anxiety Eyes Medical History: Positive for: Cataracts - removed Negative for: Glaucoma; Optic Neuritis Hematologic/Lymphatic Medical History: Positive for: Anemia Negative for: Hemophilia; Human Immunodeficiency Virus; Lymphedema; Sickle Cell Disease Respiratory Complaints and Symptoms: No Complaints or Symptoms Medical History: Negative for: Aspiration; Asthma; Chronic Obstructive Pulmonary Disease (COPD); Pneumothorax; Sleep Apnea; Tuberculosis Cardiovascular Complaints and Symptoms: No Complaints or Symptoms Medical History: Positive for: Arrhythmia; Coronary Artery Disease; Hypertension; Myocardial Infarction Negative for: Angina; Congestive Heart Failure; Deep Vein Thrombosis; Hypotension; Peripheral Arterial Disease;  Peripheral Venous Disease; Phlebitis Endocrine Medical History: Positive for: Type II Diabetes Negative for: Type I Diabetes Doughty, Carlesha C. ([id number]) Time with diabetes: no idea Treated with: Oral agents Blood sugar tested every day: No Genitourinary Medical History: Negative for: End Stage Renal Disease Immunological Medical History: Negative for: Lupus Erythematosus; Raynaudos; Scleroderma Integumentary (Skin) Medical History: Negative for: History of Burn; History of pressure wounds Musculoskeletal Medical History: Positive for: Osteoarthritis Negative for: Gout; Rheumatoid Arthritis; Osteomyelitis Neurologic Medical  History: Negative for: Dementia; Neuropathy; Quadriplegia; Paraplegia; Seizure Disorder Oncologic Medical History: Negative for: Received Chemotherapy; Received Radiation Psychiatric Complaints and Symptoms: No Complaints or Symptoms HBO Extended History Items Eyes: Cataracts Immunizations Pneumococcal Vaccine: Received Pneumococcal Vaccination: Yes Implantable Devices Family and Social History Cancer: No; Diabetes: Yes - Father,Siblings,Child; Heart Disease: No; Hypertension: Yes - Child; Kidney Disease: No; Lung Disease: Yes - Child; Seizures: No; Stroke: Yes - Mother; Thyroid Problems: No; Former smoker; Marital Status - Single; Alcohol Use: Never; Drug Use: No History; Caffeine Use: Daily; Financial Concerns: No; Food, Clothing or Shelter Needs: No; Support System Lacking: No; Transportation Concerns: No; Advanced Directives: Yes (Not Provided); Patient does not want information on Advanced Directives; Living Will: Yes (Not Provided) Physician Affirmation I have reviewed and agree with the above information. SHARLEEN, SZCZESNY ([id number]) Electronic Signature(s) Signed: 04/23/2018 5:13:49 PM By: Harold Barban Signed: 04/23/2018 9:58:15 PM By: Worthy Keeler PA-C Entered By: Worthy Keeler on 04/23/2018 13:57:33 Culotta, [name] ([id number]) -------------------------------------------------------------------------------- SuperBill Details Patient Name: Frein, Soniya C. Date of Service: 04/23/2018 Medical Record Number: [id number] Patient Account Number: [id number] Date of Birth/Sex: 26-Jul-1927 (85 y.o. F) Treating RN: Harold Barban Primary Care Provider: Ria Bush Other Clinician: Referring Provider: Ria Bush Treating Provider/Extender: Melburn Hake, Larna Capelle Weeks in Treatment: 4 Diagnosis Coding ICD-10 Codes Code Description I87.331 Chronic venous hypertension (idiopathic) with ulcer and inflammation of right lower extremity E11.622 Type 2 diabetes  mellitus with other skin ulcer L97.812 Non-pressure chronic ulcer of other part of right lower leg with fat layer exposed L03.115 Cellulitis of right lower limb Facility Procedures CPT4 Code Description: 97948016 11042 - DEB SUBQ TISSUE 20 SQ CM/< ICD-10 Diagnosis Description P53.748 Non-pressure chronic ulcer of other part of right lower leg wit Modifier: h fat layer expo Quantity: 1 sed Physician Procedures CPT4: Description Modifier Quantity Code 2707867 54492 - WC PHYS LEVEL 4 - EST PT 25 1 ICD-10 Diagnosis Description I87.331 Chronic venous hypertension (idiopathic) with ulcer and inflammation of right lower extremity E11.622 Type 2 diabetes mellitus  with other skin ulcer L97.812 Non-pressure chronic ulcer of other part of right lower leg with fat layer exposed L03.115 Cellulitis of right lower limb CPT4: 0100712 11042 - WC PHYS SUBQ TISS 20 SQ CM 1 ICD-10 Diagnosis Description R97.588 Non-pressure chronic ulcer of other part of right lower leg with fat layer exposed Electronic Signature(s) Signed: 04/23/2018 9:58:15 PM By: Worthy Keeler PA-C Entered By: Worthy Keeler on 04/23/2018 13:58:54

## 2018-04-25 DIAGNOSIS — L97211 Non-pressure chronic ulcer of right calf limited to breakdown of skin: Secondary | ICD-10-CM | POA: Diagnosis not present

## 2018-04-25 DIAGNOSIS — E1151 Type 2 diabetes mellitus with diabetic peripheral angiopathy without gangrene: Secondary | ICD-10-CM | POA: Diagnosis not present

## 2018-04-25 DIAGNOSIS — E11622 Type 2 diabetes mellitus with other skin ulcer: Secondary | ICD-10-CM | POA: Diagnosis not present

## 2018-04-25 DIAGNOSIS — I872 Venous insufficiency (chronic) (peripheral): Secondary | ICD-10-CM | POA: Diagnosis not present

## 2018-04-25 DIAGNOSIS — L97812 Non-pressure chronic ulcer of other part of right lower leg with fat layer exposed: Secondary | ICD-10-CM | POA: Diagnosis not present

## 2018-04-25 DIAGNOSIS — I2510 Atherosclerotic heart disease of native coronary artery without angina pectoris: Secondary | ICD-10-CM | POA: Diagnosis not present

## 2018-04-26 NOTE — Progress Notes (Signed)
BP 112/64 (BP Location: Left Arm, Patient Position: Sitting, Cuff Size: Normal)   Pulse 92   Temp 98.2 F (36.8 C) (Oral)   Ht 4\' 9"  (1.448 m)   Wt 140 lb 8 oz (63.7 kg)   SpO2 96%   BMI 30.40 kg/m    CC: f/u breast rash Subjective:    [name], female    DOB: Aug 29, 1927, 85 y.o.   MRN: [id number]  HPI: [name] is a 85 y.o. female presenting on 04/27/2018 for Rash (C/o rash on lateral side of right breast. Says [facility] suggested she see her PCP. )   See prior note for details.  Seen here 04/18/2018 with erythema of lateral breasts at axillae R>L, treated for possible cellulitis with keflex course. We also obtained dx mammo/US of breasts that returned reassuringly normal. Here for f/u. Tenderness and soreness is slightly better but redness persists.   Denies new lotions, detergents, soaps or shampoos.  No new medicines.     Relevant past medical, surgical, family and social history reviewed and updated as indicated. Interim medical history since our last visit reviewed. Allergies and medications reviewed and updated. Outpatient Medications Prior to Visit  Medication Sig Dispense Refill  . acetaminophen (TYLENOL) 500 MG tablet Take 500 mg by mouth every 6 (six) hours as needed for mild pain.    Marland Kitchen ALPRAZolam (XANAX) 1 MG tablet Take 0.5 mg by mouth 4 (four) times daily as needed for anxiety.     Marland Kitchen aspirin EC 81 MG tablet Take 81 mg by mouth daily.    . cephALEXin (KEFLEX) 500 MG capsule Take 1 capsule (500 mg total) by mouth 3 (three) times daily. 21 capsule 0  . collagenase (SANTYL) ointment Apply topically daily. 15 g 0  . diclofenac sodium (VOLTAREN) 1 % GEL Apply 1 application topically 3 (three) times daily. 1 Tube 1  . famotidine (PEPCID) 20 MG tablet Take 1 tablet (20 mg total) by mouth at bedtime.    . furosemide (LASIX) 20 MG tablet TAKE 0.5 TABLETS (10 MG TOTAL) BY MOUTH DAILY. 45 tablet 3  . glucosamine-chondroitin 500-400 MG tablet  Take 1 tablet by mouth daily.     Marland Kitchen glucose blood (ONE TOUCH ULTRA TEST) test strip USE AS INSTRUCTED TO CHECK BLOOD SUGAR ONCE A DAY Dx code: E11.9 50 each 5  . HYDROcodone-acetaminophen (NORCO/VICODIN) 5-325 MG tablet Take 1 tablet by mouth 3 (three) times daily as needed for moderate pain. 90 tablet 0  . loperamide (IMODIUM) 2 MG capsule Take 2 mg by mouth 4 (four) times daily as needed for diarrhea or loose stools.    Marland Kitchen loratadine (CLARITIN) 10 MG tablet Take 10 mg by mouth daily as needed for allergies.    . metoprolol tartrate (LOPRESSOR) 25 MG tablet TAKE ONE AND HALF TABLETS (37.5 MG TOTAL) BY MOUTH 2 (TWO) TIMES DAILY. 270 tablet 1  . Multiple Vitamin (MULTIVITAMIN) tablet Take 1 tablet by mouth daily. Patient reports takes a new vitamin to replace her centrum as recommended by her eye doctor.    . nitroGLYCERIN (NITROSTAT) 0.4 MG SL tablet Place 1 tablet (0.4 mg total) under the tongue every 5 (five) minutes as needed for chest pain. 25 tablet 3  . nystatin-triamcinolone ointment (MYCOLOG) APPLY 1 APPLICATION TOPICALLY 2 (TWO) TIMES DAILY. AS NEEDED FOR ITCHING 60 g 1  . ONETOUCH VERIO test strip USE TO TEST BLOOD SUGAR ONCE DAILY 100 each 6  . pantoprazole (PROTONIX) 40  MG tablet Take 1 tablet (40 mg total) by mouth daily. 30 tablet 3  . pioglitazone (ACTOS) 30 MG tablet TAKE 1 TABLET BY MOUTH EVERY DAY 90 tablet 3  . pravastatin (PRAVACHOL) 20 MG tablet TAKE 1 TABLET BY MOUTH EVERY DAY 90 tablet 3  . Probiotic Product (ALIGN) 4 MG CAPS Take 1 capsule (4 mg total) by mouth daily.    . ramipril (ALTACE) 5 MG capsule TAKE ONE CAPSULE BY MOUTH EVERYDAY FOR BLOOD PRESSURE 90 capsule 1  . senna-docusate (SENOKOT-S) 8.6-50 MG tablet Take 1 tablet by mouth daily as needed for moderate constipation. 30 tablet 0  . sitaGLIPtin (JANUVIA) 100 MG tablet Take 1 tablet (100 mg total) by mouth daily. (Patient taking differently: Take 100 mg by mouth daily. Takes 1/2 tablet) 30 tablet 2  . traZODone  (DESYREL) 150 MG tablet Take 150 mg by mouth at bedtime.     . vitamin B-12 (CYANOCOBALAMIN) 1000 MCG tablet Take 1,000 mcg by mouth daily.    . vitamin E 400 UNIT capsule Take 400 Units by mouth daily.    Marland Kitchen triamcinolone cream (KENALOG) 0.1 % APPLY 1 APPLICATION TOPICALLY 2 (TWO) TIMES DAILY.  1   No facility-administered medications prior to visit.      Per HPI unless specifically indicated in ROS section below Review of Systems Objective:    BP 112/64 (BP Location: Left Arm, Patient Position: Sitting, Cuff Size: Normal)   Pulse 92   Temp 98.2 F (36.8 C) (Oral)   Ht 4\' 9"  (1.448 m)   Wt 140 lb 8 oz (63.7 kg)   SpO2 96%   BMI 30.40 kg/m   Wt Readings from Last 3 Encounters:  04/27/18 140 lb 8 oz (63.7 kg)  04/18/18 141 lb 12 oz (64.3 kg)  04/11/18 142 lb 8 oz (64.6 kg)    Physical Exam Vitals signs and nursing note reviewed. Exam conducted with a chaperone present.  Constitutional:      General: She is not in acute distress.    Appearance: Normal appearance. She is not ill-appearing.  Chest:     Breasts: Breasts are symmetrical.        Right: Skin change and tenderness present. No swelling or mass.        Left: Skin change and tenderness present. No swelling or mass.       Comments: Mildly tender erythema of R>L superior lateral breast into inner arm, warmth present Lymphadenopathy:     Upper Body:     Right upper body: No supraclavicular, axillary or pectoral adenopathy.     Left upper body: No supraclavicular, axillary or pectoral adenopathy.  Neurological:     Mental Status: She is alert.       US BREAST LTD UNI RIGHT INC AXILLA CLINICAL DATA:  Patient presents for palpable abnormality within the right breast 10 o'clock position as well as erythema within the upper-outer right breast.  EXAM: DIGITAL DIAGNOSTIC BILATERAL MAMMOGRAM WITH CAD AND TOMO  ULTRASOUND RIGHT BREAST  COMPARISON:  Previous exam(s).  ACR Breast Density Category c: The breast tissue  is heterogeneously dense, which may obscure small masses.  FINDINGS: No concerning masses, calcifications or distortion identified within either breast.  Mammographic images were processed with CAD.  On physical exam there is a large area of cutaneous redness overlying the upper-outer right breast.  Targeted ultrasound is performed, showing no discrete mass or suspicious abnormality within the right breast 9, 10, 11 and 12 o'clock positions.  IMPRESSION: No mammographic  evidence for malignancy.  Indeterminate cutaneous redness overlying the upper-outer right breast.  RECOMMENDATION: Continued clinical evaluation for cutaneous redness overlying the upper-outer right breast. If this does not resolve with antibiotic therapy, recommend surgical or dermatologic consultation for skin punch biopsy.  Screening mammogram in one year.(Code:SM-B-01Y)  I have discussed the findings and recommendations with the patient. Results were also provided in writing at the conclusion of the visit. If applicable, a reminder letter will be sent to the patient regarding the next appointment.  BI-RADS CATEGORY  2: Benign.  Electronically Signed   By: Lovey Newcomer M.D.   On: 04/24/2018 14:57 MM DIAG BREAST TOMO BILATERAL CLINICAL DATA:  Patient presents for palpable abnormality within the right breast 10 o'clock position as well as erythema within the upper-outer right breast.  EXAM: DIGITAL DIAGNOSTIC BILATERAL MAMMOGRAM WITH CAD AND TOMO  ULTRASOUND RIGHT BREAST  COMPARISON:  Previous exam(s).  ACR Breast Density Category c: The breast tissue is heterogeneously dense, which may obscure small masses.  FINDINGS: No concerning masses, calcifications or distortion identified within either breast.  Mammographic images were processed with CAD.  On physical exam there is a large area of cutaneous redness overlying the upper-outer right breast.  Targeted ultrasound is performed, showing  no discrete mass or suspicious abnormality within the right breast 9, 10, 11 and 12 o'clock positions.  IMPRESSION: No mammographic evidence for malignancy.  Indeterminate cutaneous redness overlying the upper-outer right breast.  RECOMMENDATION: Continued clinical evaluation for cutaneous redness overlying the upper-outer right breast. If this does not resolve with antibiotic therapy, recommend surgical or dermatologic consultation for skin punch biopsy.  Screening mammogram in one year.(Code:SM-B-01Y)  I have discussed the findings and recommendations with the patient. Results were also provided in writing at the conclusion of the visit. If applicable, a reminder letter will be sent to the patient regarding the next appointment.  BI-RADS CATEGORY  2: Benign.  Electronically Signed   By: Lovey Newcomer M.D.   On: 04/24/2018 14:57   Assessment & Plan:   Problem List Items Addressed This Visit    Erythema of breast - Primary    Tender and warm erythema of R>L upper inner breasts persists, slightly improved after 1 wk keflex course. No inciting med changes or lotion/detergent changes noted. Reassuring mammo/US. Will treat as breast dermatitis with 1-2 wk of topical triamcinolone and if not improved pt will contact me for dermatology referral. Pt agrees with plan.       Relevant Medications   triamcinolone cream (KENALOG) 0.1 %       Meds ordered this encounter  Medications  . triamcinolone cream (KENALOG) 0.1 %    Sig: APPLY 1 APPLICATION TOPICALLY 2 (TWO) TIMES DAILY.    Dispense:  45 g    Refill:  1   No orders of the defined types were placed in this encounter.   Follow up plan: Return if symptoms worsen or fail to improve.  Ria Bush, MD

## 2018-04-27 ENCOUNTER — Ambulatory Visit (INDEPENDENT_AMBULATORY_CARE_PROVIDER_SITE_OTHER): Payer: Medicare Other | Admitting: Family Medicine

## 2018-04-27 ENCOUNTER — Encounter: Payer: Self-pay | Admitting: Family Medicine

## 2018-04-27 VITALS — BP 112/64 | HR 92 | Temp 98.2°F | Ht <= 58 in | Wt 140.5 lb

## 2018-04-27 DIAGNOSIS — I2510 Atherosclerotic heart disease of native coronary artery without angina pectoris: Secondary | ICD-10-CM

## 2018-04-27 DIAGNOSIS — L97211 Non-pressure chronic ulcer of right calf limited to breakdown of skin: Secondary | ICD-10-CM | POA: Diagnosis not present

## 2018-04-27 DIAGNOSIS — L539 Erythematous condition, unspecified: Secondary | ICD-10-CM | POA: Diagnosis not present

## 2018-04-27 DIAGNOSIS — I872 Venous insufficiency (chronic) (peripheral): Secondary | ICD-10-CM | POA: Diagnosis not present

## 2018-04-27 DIAGNOSIS — L97812 Non-pressure chronic ulcer of other part of right lower leg with fat layer exposed: Secondary | ICD-10-CM | POA: Diagnosis not present

## 2018-04-27 DIAGNOSIS — E1151 Type 2 diabetes mellitus with diabetic peripheral angiopathy without gangrene: Secondary | ICD-10-CM | POA: Diagnosis not present

## 2018-04-27 DIAGNOSIS — E11622 Type 2 diabetes mellitus with other skin ulcer: Secondary | ICD-10-CM | POA: Diagnosis not present

## 2018-04-27 MED ORDER — TRIAMCINOLONE ACETONIDE 0.1 % EX CREA: TOPICAL_CREAM | CUTANEOUS | 1 refills | Status: DC

## 2018-04-27 NOTE — Assessment & Plan Note (Addendum)
Tender and warm erythema of R>L upper inner breasts persists, slightly improved after 1 wk keflex course. No inciting med changes or lotion/detergent changes noted. Reassuring mammo/US. Will treat as breast dermatitis with 1-2 wk of topical triamcinolone and if not improved pt will contact me for dermatology referral. Pt agrees with plan.

## 2018-04-27 NOTE — Patient Instructions (Addendum)
Try steroid cream sent to pharmacy twice daily to skin for 1-2 weeks. If not improving with this, call me to let me know, and we will refer you to skin doctor.   Contact Dermatitis Dermatitis is redness, soreness, and swelling (inflammation) of the skin. Contact dermatitis is a reaction to something that touches the skin. There are two types of contact dermatitis:  Irritant contact dermatitis. This happens when something bothers (irritates) your skin, like soap.  Allergic contact dermatitis. This is caused when you are exposed to something that you are allergic to, such as poison ivy. What are the causes?  Common causes of irritant contact dermatitis include: ? Makeup. ? Soaps. ? Detergents. ? Bleaches. ? Acids. ? Metals, such as nickel.  Common causes of allergic contact dermatitis include: ? Plants. ? Chemicals. ? Jewelry. ? Latex. ? Medicines. ? Preservatives in products, such as clothing. What increases the risk?  Having a job that exposes you to things that bother your skin.  Having asthma or eczema. What are the signs or symptoms? Symptoms may happen anywhere the irritant has touched your skin. Symptoms include:  Dry or flaky skin.  Redness.  Cracks.  Itching.  Pain or a burning feeling.  Blisters.  Blood or clear fluid draining from skin cracks. With allergic contact dermatitis, swelling may occur. This may happen in places such as the eyelids, mouth, or genitals. How is this treated?  This condition is treated by checking for the cause of the reaction and protecting your skin. Treatment may also include: ? Steroid creams, ointments, or medicines. ? Antibiotic medicines or other ointments, if you have a skin infection. ? Lotion or medicines to help with itching. ? A bandage (dressing). Follow these instructions at home: Skin care  Moisturize your skin as needed.  Put cool cloths on your skin.  Put a baking soda paste on your skin. Stir water into  baking soda until it looks like a paste.  Do not scratch your skin.  Avoid having things rub up against your skin.  Avoid the use of soaps, perfumes, and dyes. Medicines  Take or apply over-the-counter and prescription medicines only as told by your doctor.  If you were prescribed an antibiotic medicine, take or apply it as told by your doctor. Do not stop using it even if your condition starts to get better. Bathing  Take a bath with: ? Epsom salts. ? Baking soda. ? Colloidal oatmeal.  Bathe less often.  Bathe in warm water. Avoid using hot water. Bandage care  If you were given a bandage, change it as told by your health care provider.  Wash your hands with soap and water before and after you change your bandage. If soap and water are not available, use hand sanitizer. General instructions  Avoid the things that caused your reaction. If you do not know what caused it, keep a journal. Write down: ? What you eat. ? What skin products you use. ? What you drink. ? What you wear in the area that has symptoms. This includes jewelry.  Check the affected areas every day for signs of infection. Check for: ? More redness, swelling, or pain. ? More fluid or blood. ? Warmth. ? Pus or a bad smell.  Keep all follow-up visits as told by your doctor. This is important. Contact a doctor if:  You do not get better with treatment.  Your condition gets worse.  You have signs of infection, such as: ? More swelling. ? Tenderness. ?  More redness. ? Soreness. ? Warmth.  You have a fever.  You have new symptoms. Get help right away if:  You have a very bad headache.  You have neck pain.  Your neck is stiff.  You throw up (vomit).  You feel very sleepy.  You see red streaks coming from the area.  Your bone or joint near the area hurts after the skin has healed.  The area turns darker.  You have trouble breathing. Summary  Dermatitis is redness, soreness, and  swelling of the skin.  Symptoms may occur where the irritant has touched you.  Treatment may include medicines and skin care.  If you do not know what caused your reaction, keep a journal.  Contact a doctor if your condition gets worse or you have signs of infection. This information is not intended to replace advice given to you by your health care provider. Make sure you discuss any questions you have with your health care provider. Document Released: 01/16/2009 Document Revised: 10/04/2017 Document Reviewed: 10/04/2017 Elsevier Interactive Patient Education  2019 [employer].

## 2018-04-30 DIAGNOSIS — F411 Generalized anxiety disorder: Secondary | ICD-10-CM | POA: Diagnosis not present

## 2018-04-30 DIAGNOSIS — F429 Obsessive-compulsive disorder, unspecified: Secondary | ICD-10-CM | POA: Diagnosis not present

## 2018-04-30 DIAGNOSIS — F4321 Adjustment disorder with depressed mood: Secondary | ICD-10-CM | POA: Diagnosis not present

## 2018-05-01 ENCOUNTER — Encounter: Payer: Medicare Other | Admitting: Physician Assistant

## 2018-05-01 DIAGNOSIS — S80811S Abrasion, right lower leg, sequela: Secondary | ICD-10-CM | POA: Diagnosis not present

## 2018-05-01 DIAGNOSIS — I87331 Chronic venous hypertension (idiopathic) with ulcer and inflammation of right lower extremity: Secondary | ICD-10-CM | POA: Diagnosis not present

## 2018-05-01 DIAGNOSIS — L97812 Non-pressure chronic ulcer of other part of right lower leg with fat layer exposed: Secondary | ICD-10-CM | POA: Diagnosis not present

## 2018-05-01 DIAGNOSIS — E11622 Type 2 diabetes mellitus with other skin ulcer: Secondary | ICD-10-CM | POA: Diagnosis not present

## 2018-05-01 DIAGNOSIS — L03115 Cellulitis of right lower limb: Secondary | ICD-10-CM | POA: Diagnosis not present

## 2018-05-02 ENCOUNTER — Other Ambulatory Visit
Admission: RE | Admit: 2018-05-02 | Discharge: 2018-05-02 | Disposition: A | Discharge status: Home / Self Care | Payer: Medicare Other | Source: Ambulatory Visit | Attending: Physician Assistant | Admitting: Physician Assistant

## 2018-05-02 DIAGNOSIS — B999 Unspecified infectious disease: Secondary | ICD-10-CM | POA: Diagnosis not present

## 2018-05-02 DIAGNOSIS — L97211 Non-pressure chronic ulcer of right calf limited to breakdown of skin: Secondary | ICD-10-CM | POA: Diagnosis not present

## 2018-05-02 DIAGNOSIS — I872 Venous insufficiency (chronic) (peripheral): Secondary | ICD-10-CM | POA: Diagnosis not present

## 2018-05-02 DIAGNOSIS — E1151 Type 2 diabetes mellitus with diabetic peripheral angiopathy without gangrene: Secondary | ICD-10-CM | POA: Diagnosis not present

## 2018-05-02 DIAGNOSIS — L97812 Non-pressure chronic ulcer of other part of right lower leg with fat layer exposed: Secondary | ICD-10-CM | POA: Diagnosis not present

## 2018-05-02 DIAGNOSIS — I2510 Atherosclerotic heart disease of native coronary artery without angina pectoris: Secondary | ICD-10-CM | POA: Diagnosis not present

## 2018-05-02 DIAGNOSIS — E11622 Type 2 diabetes mellitus with other skin ulcer: Secondary | ICD-10-CM | POA: Diagnosis not present

## 2018-05-03 NOTE — Progress Notes (Signed)
[name] ([id number]) Visit Report for 05/01/2018 Arrival Information Details Patient Name: [name], [name] Date of Service: 05/01/2018 1:15 PM Medical Record Number: [id number] Patient Account Number: [id number] Date of Birth/Sex: [birth year] (85 y.o. F) Treating RN: Montey Hora Primary Care Darbi Chandran: Ria Bush Other Clinician: Referring Jaydeen Odor: Ria Bush Treating Nana Hoselton/Extender: Melburn Hake, HOYT Weeks in Treatment: 5 Visit Information History Since Last Visit Added or deleted any medications: No Patient Arrived: [place] Any new allergies or adverse reactions: No Arrival Time: 13:20 Had a fall or experienced change in No Accompanied By: self activities of daily living that may affect Transfer Assistance: None risk of falls: Patient Identification Verified: Yes Signs or symptoms of abuse/neglect since last visito No Secondary Verification Process Completed: Yes Hospitalized since last visit: No Implantable device outside of the clinic excluding No cellular tissue based products placed in the center since last visit: Has Dressing in Place as Prescribed: Yes Pain Present Now: Yes Electronic Signature(s) Signed: 05/01/2018 4:05:03 PM By: Lorine Bears RCP, RRT, CHT Entered By: Lorine Bears on 05/01/2018 13:22:14 Ethridge, [name] ([id number]) -------------------------------------------------------------------------------- Clinic Level of Care Assessment Details Patient Name: [name], [name] Date of Service: 05/01/2018 1:15 PM Medical Record Number: [id number] Patient Account Number: [id number] Date of Birth/Sex: [birth year] (85 y.o. F) Treating RN: Harold Barban Primary Care Demetrick Eichenberger: Ria Bush Other Clinician: Referring Aaditya Letizia: Ria Bush Treating Zyaire Dumas/Extender: Melburn Hake, HOYT Weeks in Treatment: 5 Clinic Level of Care Assessment Items TOOL 1 Quantity Score []  - Use when EandM and Procedure  is performed on INITIAL visit 0 ASSESSMENTS - Nursing Assessment / Reassessment X - General Physical Exam (combine w/ comprehensive assessment (listed just below) when 1 20 performed on new pt. evals) X- 1 25 Comprehensive Assessment (HX, ROS, Risk Assessments, Wounds Hx, etc.) ASSESSMENTS - Wound and Skin Assessment / Reassessment []  - Dermatologic / Skin Assessment (not related to wound area) 0 ASSESSMENTS - Ostomy and/or Continence Assessment and Care []  - Incontinence Assessment and Management 0 []  - 0 Ostomy Care Assessment and Management (repouching, etc.) PROCESS - Coordination of Care X - Simple Patient / Family Education for ongoing care 1 15 []  - 0 Complex (extensive) Patient / Family Education for ongoing care X- 1 10 Staff obtains Programmer, systems, Records, Test Results / Process Orders X- 1 10 Staff telephones HHA, Nursing Homes / Clarify orders / etc []  - 0 Routine Transfer to another Facility (non-emergent condition) []  - 0 Routine Hospital Admission (non-emergent condition) []  - 0 New Admissions / Biomedical engineer / Ordering NPWT, Apligraf, etc. []  - 0 Emergency Hospital Admission (emergent condition) PROCESS - Special Needs []  - Pediatric / Minor Patient Management 0 []  - 0 Isolation Patient Management []  - 0 Hearing / Language / Visual special needs []  - 0 Assessment of Community assistance (transportation, D/C planning, etc.) []  - 0 Additional assistance / Altered mentation []  - 0 Support Surface(s) Assessment (bed, cushion, seat, etc.) Kimbler, Latana C. ([id number]) INTERVENTIONS - Miscellaneous []  - External ear exam 0 []  - 0 Patient Transfer (multiple staff / Civil Service fast streamer / Similar devices) []  - 0 Simple Staple / Suture removal (25 or less) []  - 0 Complex Staple / Suture removal (26 or more) []  - 0 Hypo/Hyperglycemic Management (do not check if billed separately) []  - 0 Ankle / Brachial Index (ABI) - do not check if billed separately Has the  patient been seen at the hospital within the last three years: Yes Total Score: 80 Level Of Care: New/Established - Level  3 Electronic Signature(s) Signed: 05/01/2018 5:16:49 PM By: Harold Barban Entered By: Harold Barban on 05/01/2018 14:54:14 Kimbley, [name] ([id number]) -------------------------------------------------------------------------------- Lower Extremity Assessment Details Patient Name: [name], [name] Date of Service: 05/01/2018 1:15 PM Medical Record Number: [id number] Patient Account Number: [id number] Date of Birth/Sex: Jul 26, 1927 (85 y.o. F) Treating RN: Montey Hora Primary Care Telford Archambeau: Ria Bush Other Clinician: Referring Mekia Dipinto: Ria Bush Treating Laymon Stockert/Extender: Melburn Hake, HOYT Weeks in Treatment: 5 Edema Assessment Assessed: [Left: No] [Right: No] Edema: [Left: Ye] [Right: s] Calf Left: Right: Point of Measurement: 30 cm From Medial Instep cm 29.5 cm Ankle Left: Right: Point of Measurement: 10 cm From Medial Instep cm 19 cm Vascular Assessment Pulses: Dorsalis Pedis Palpable: [Right:Yes] Posterior Tibial Extremity colors, hair growth, and conditions: Extremity Color: [Right:Hyperpigmented] Hair Growth on Extremity: [Right:No] Temperature of Extremity: [Right:Warm] Capillary Refill: [Right:< 3 seconds] Toe Nail Assessment Left: Right: Thick: Yes Discolored: Yes Deformed: No Improper Length and Hygiene: No Electronic Signature(s) Signed: 05/01/2018 4:47:13 PM By: Montey Hora Entered By: Montey Hora on 05/01/2018 13:58:29 Glockner, Leontina CMarland Kitchen ([id number]) -------------------------------------------------------------------------------- Multi Wound Chart Details Patient Name: [name], [name] Date of Service: 05/01/2018 1:15 PM Medical Record Number: [id number] Patient Account Number: [id number] Date of Birth/Sex: November 15, 1927 (85 y.o. 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] [N/A:N/A] Comorbid History: [8:Cataracts, Anemia, Arrhythmia, Coronary Artery Disease, Hypertension, Myocardial Infarction, Type II Diabetes, Osteoarthritis] [N/A:N/A] Date Acquired: [8:03/09/2018] [N/A:N/A] Weeks of Treatment: [8:5] [N/A:N/A] Wound Status: [8:Open] [N/A:N/A] Measurements L x W x D [8:4x2x0.2] [N/A:N/A] (cm) Area (cm) : [8:6.283] [N/A:N/A] Volume (cm) : [8:1.257] [N/A:N/A] % Reduction in Area: [8:-233.30%] [N/A:N/A] % Reduction in Volume: [8:-122.50%] [N/A:N/A] Classification: [8:Grade 2] [N/A:N/A] Exudate Amount: [8:Medium] [N/A:N/A] Exudate Type: [8:Serous] [N/A:N/A] Exudate Color: [8:amber] [N/A:N/A] Wound Margin: [8:Flat and Intact] [N/A:N/A] Granulation Amount: [8:Small (1-33%)] [N/A:N/A] Necrotic Amount: [8:Large (67-100%)] [N/A:N/A] Exposed Structures: [8:Fat Layer (Subcutaneous Tissue) Exposed: Yes Fascia: No Tendon: No Muscle: No Joint: No Bone: No] [N/A:N/A] Epithelialization: [8:None] [N/A:N/A] Periwound Skin Texture: [8:Excoriation: No Induration: No Callus: No] [N/A:N/A] Crepitus: No Rash: No Scarring: No Periwound Skin Moisture: Dry/Scaly: Yes N/A N/A Maceration: No Periwound Skin Color: Erythema: Yes N/A N/A Atrophie Blanche: No Cyanosis: No Ecchymosis: No Hemosiderin Staining: No Mottled: No Pallor: No Rubor: No Erythema Location: Circumferential N/A N/A Temperature: No Abnormality N/A N/A Tenderness on Palpation: Yes N/A N/A Wound Preparation: Ulcer Cleansing: N/A  N/A Rinsed/Irrigated with Saline Topical Anesthetic Applied: Other: lidocaine 4% Treatment Notes Electronic Signature(s) Signed: 05/01/2018 5:16:49 PM By: Harold Barban Entered By: Harold Barban on 05/01/2018 14:38:48 Oleary, [name] ([id number]) -------------------------------------------------------------------------------- [place] Details Patient Name: [name], [name] Date of Service: 05/01/2018 1:15 PM Medical Record Number: [id number] Patient Account Number: [id number] Date of Birth/Sex: 25-Jun-1927 (85 y.o. F) Treating RN: Harold Barban Primary Care Jeanean Hollett: Ria Bush Other Clinician: Referring Rachael Zapanta: Ria Bush Treating Huxley Vanwagoner/Extender: Melburn Hake, HOYT Weeks in Treatment: 5 Active Inactive Abuse / Safety / Falls / Self Care Management Nursing Diagnoses: Potential for falls Goals: Patient will not experience any injury related to falls Date Initiated: 03/23/2018 Target Resolution Date: 06/09/2018 Goal Status: Active Interventions: Assess fall risk on admission and as needed Notes:  Orientation to the Wound Care Program Nursing Diagnoses: Knowledge deficit related to the wound healing center program Goals: Patient/caregiver will verbalize understanding of the [place] Program Date Initiated: 03/23/2018 Target Resolution Date: 06/09/2018 Goal Status: Active Interventions: Provide education on orientation to the wound center Notes: Pain, Acute or Chronic Nursing Diagnoses: Pain, acute or chronic: actual or potential Goals: Patient will verbalize adequate pain control and receive pain control interventions during procedures as needed Date Initiated: 03/23/2018 Target Resolution Date: 06/09/2018 Goal Status: Active Interventions: Assess comfort goal upon admission Westmoreland, Junelle C. ([id number]) Notes: Wound/Skin Impairment Nursing Diagnoses: Impaired tissue integrity Goals: Ulcer/skin breakdown will heal  within 14 weeks Date Initiated: 03/23/2018 Target Resolution Date: 06/09/2018 Goal Status: Active Interventions: Assess patient/caregiver ability to obtain necessary supplies Assess patient/caregiver ability to perform ulcer/skin care regimen upon admission and as needed Assess ulceration(s) every visit Notes: Electronic Signature(s) Signed: 05/01/2018 5:16:49 PM By: Harold Barban Entered By: Harold Barban on 05/01/2018 14:38:37 Koehl, Azizi C. ([id number]) -------------------------------------------------------------------------------- Pain Assessment Details Patient Name: [name], [name] Date of Service: 05/01/2018 1:15 PM Medical Record Number: [id number] Patient Account Number: [id number] Date of Birth/Sex: 03-22-1928 (85 y.o. F) Treating RN: Montey Hora Primary Care Racer Quam: Ria Bush Other Clinician: Referring Abbigal Radich: Ria Bush Treating Laylamarie Meuser/Extender: Melburn Hake, HOYT Weeks in Treatment: 5 Active Problems Location of Pain Severity and Description of Pain Patient Has [name] Yes Site Locations Rate the pain. Current Pain Level: 6 Pain Management and Medication Current Pain Management: Electronic Signature(s) Signed: 05/01/2018 4:05:03 PM By: Paulla Fore, RRT, CHT Signed: 05/01/2018 4:47:13 PM By: Montey Hora Entered By: Lorine Bears on 05/01/2018 13:22:25 Brandenburger, [name] ([id number]) -------------------------------------------------------------------------------- Patient/Caregiver Education Details Patient Name: [name], [name] Date of Service: 05/01/2018 1:15 PM Medical Record Number: [id number] Patient Account Number: [id number] Date of Birth/Gender: 08/11/1927 (85 y.o. F) Treating RN: Harold Barban Primary Care Physician: Ria Bush Other Clinician: Referring Physician: Ria Bush Treating Physician/Extender: Sharalyn Ink in Treatment: 5 Education Assessment Education  Provided To: Patient Education Topics Provided Wound/Skin Impairment: Handouts: Caring for Your Ulcer Methods: Demonstration, Explain/Verbal Responses: State content correctly Electronic Signature(s) Signed: 05/01/2018 5:16:49 PM By: Harold Barban Entered By: Harold Barban on 05/01/2018 14:39:07 Settlemire, Danille C. ([id number]) -------------------------------------------------------------------------------- Wound Assessment Details Patient Name: [name], [name] Date of Service: 05/01/2018 1:15 PM Medical Record Number: [id number] Patient Account Number: [id number] Date of Birth/Sex: 09-19-1927 (85 y.o. F) Treating RN: Montey Hora Primary Care Saje Gallop: Ria Bush Other Clinician: Referring Osmin Welz: Ria Bush Treating Dustyn Armbrister/Extender: STONE III, HOYT Weeks in Treatment: 5 Wound Status Wound Number: 8 Primary Diabetic Wound/Ulcer of the Lower Extremity Etiology: Wound Location: Right Lower Leg - Lateral Wound Open Wounding Event: Trauma Status: Date Acquired: 03/09/2018 Comorbid Cataracts, Anemia, Arrhythmia, Coronary Artery Weeks Of Treatment: 5 History: Disease, Hypertension, Myocardial Infarction, Clustered Wound: No Type II Diabetes, Osteoarthritis Photos Photo Uploaded By: Montey Hora on 05/01/2018 16:07:18 Wound Measurements Length: (cm) 4 Width: (cm) 2 Depth: (cm) 0.2 Area: (cm) 6.283 Volume: (cm) 1.257 % Reduction in Area: -233.3% % Reduction in Volume: -122.5% Epithelialization: None Tunneling: No Undermining: No Wound Description Classification: Grade 2 Wound Margin: Flat and Intact Exudate Amount: Medium Exudate Type: Serous Exudate Color: amber Foul Odor After Cleansing: No Slough/Fibrino Yes Wound Bed Granulation Amount: Small (1-33%) Exposed Structure Necrotic Amount: Large (67-100%) Fascia Exposed: No Necrotic Quality: Adherent Slough Fat Layer (Subcutaneous Tissue) Exposed: Yes Tendon Exposed: No Muscle Exposed:  No Joint Exposed: No Bone Exposed: No Periwound Skin Texture Pendell, Ayona  C. ([id number]) Texture Color No Abnormalities Noted: No No Abnormalities Noted: No Callus: No Atrophie Blanche: No Crepitus: No Cyanosis: No Excoriation: No Ecchymosis: No Induration: No Erythema: Yes Rash: No Erythema Location: Circumferential Scarring: No Hemosiderin Staining: No Mottled: No Moisture Pallor: No No Abnormalities Noted: No Rubor: No Dry / Scaly: Yes Maceration: No Temperature / Pain Temperature: No Abnormality Tenderness on Palpation: Yes Wound Preparation Ulcer Cleansing: Rinsed/Irrigated with Saline Topical Anesthetic Applied: Other: lidocaine 4%, Electronic Signature(s) Signed: 05/01/2018 4:47:13 PM By: Montey Hora Entered By: Montey Hora on 05/01/2018 13:56:47 Godek, [name] ([id number]) -------------------------------------------------------------------------------- Vitals Details Patient Name: [name], [name] Date of Service: 05/01/2018 1:15 PM Medical Record Number: [id number] Patient Account Number: [id number] Date of Birth/Sex: 1927-09-23 (85 y.o. F) Treating RN: Montey Hora Primary Care Parks Czajkowski: Ria Bush Other Clinician: Referring Maryam Feely: Ria Bush Treating Sender Rueb/Extender: STONE III, HOYT Weeks in Treatment: 5 Vital Signs Time Taken: 13:22 Temperature (F): 97.7 Pulse (bpm): 73 Respiratory Rate (breaths/min): 16 Blood Pressure (mmHg): 116/92 Reference Range: 80 - 120 mg / dl Airway Electronic Signature(s) Signed: 05/01/2018 4:05:03 PM By: Lorine Bears RCP, RRT, CHT Entered By: Lorine Bears on 05/01/2018 13:24:52

## 2018-05-04 DIAGNOSIS — L97211 Non-pressure chronic ulcer of right calf limited to breakdown of skin: Secondary | ICD-10-CM | POA: Diagnosis not present

## 2018-05-04 DIAGNOSIS — E1151 Type 2 diabetes mellitus with diabetic peripheral angiopathy without gangrene: Secondary | ICD-10-CM | POA: Diagnosis not present

## 2018-05-04 DIAGNOSIS — I2510 Atherosclerotic heart disease of native coronary artery without angina pectoris: Secondary | ICD-10-CM | POA: Diagnosis not present

## 2018-05-04 DIAGNOSIS — I872 Venous insufficiency (chronic) (peripheral): Secondary | ICD-10-CM | POA: Diagnosis not present

## 2018-05-04 DIAGNOSIS — E11622 Type 2 diabetes mellitus with other skin ulcer: Secondary | ICD-10-CM | POA: Diagnosis not present

## 2018-05-04 DIAGNOSIS — L97812 Non-pressure chronic ulcer of other part of right lower leg with fat layer exposed: Secondary | ICD-10-CM | POA: Diagnosis not present

## 2018-05-04 LAB — AEROBIC CULTURE W GRAM STAIN (SUPERFICIAL SPECIMEN)

## 2018-05-04 LAB — AEROBIC CULTURE  (SUPERFICIAL SPECIMEN)

## 2018-05-04 NOTE — Progress Notes (Signed)
DORINA, RIBAUDO ([id number]) Visit Report for 05/01/2018 Chief Complaint Document Details Patient Name: Petit, Sonal C. Date of Service: 05/01/2018 1:15 PM Medical Record Number: [id number] Patient Account Number: [id number] Date of Birth/Sex: [birth year] (85 y.o. F) Treating RN: Montey Hora Primary Care Provider: Ria Bush Other Clinician: Referring Provider: Ria Bush Treating Provider/Extender: Melburn Hake, HOYT Weeks in Treatment: 5 Information Obtained from: Patient Chief Complaint RLE wounds Electronic Signature(s) Signed: 05/03/2018 1:01:02 AM By: Worthy Keeler PA-C Entered By: Worthy Keeler on 05/01/2018 13:10:08 Ramsay, Jocilyn C. ([id number]) -------------------------------------------------------------------------------- HPI Details Patient Name: Gamarra, Jadon C. Date of Service: 05/01/2018 1:15 PM Medical Record Number: [id number] Patient Account Number: [id number] Date of Birth/Sex: 05-13-1927 (85 y.o. F) Treating RN: Montey Hora Primary Care Provider: Ria Bush Other Clinician: Referring Provider: Ria Bush Treating Provider/Extender: Melburn Hake, HOYT Weeks in Treatment: 5 History of Present Illness HPI Description: Readmission: 03/23/18 on evaluation today patient presents for readmission entire clinic concerning issues that she has been having for the past couple of weeks in regard to her right lower extremity. The medial injury actually occurred when her dog tried to take a blanket from her and subsequently scratched her with his toenail. Subsequently the lateral injury was a wound where she dropped a knife in her kitchen and hit the side of her leg. With that being said both have been open for some time and home health nurse that has been coming out marked for her where the redness was several days ago. Nonetheless this has spread will be on the margin of what was marked. She was placed on Keflex which was started two days ago  but at this point it does not seem to have helped with any improvement in the overall appearance of the wounds. No fevers, chills, nausea, or vomiting noted at this time. 04/02/18 Seen today for follow up and management of right lower extremity wounds. Right lateral wound with adherent slough. She is complaining of pain to touch which limited exam. Recently treated in the ED for cellulitis and wound culture positive for MRSA. Completed a dose of Vancomycin and transitioned to doxycycline by mouth. Erythema remains present with improvement since last visit. Right medial wound with scab present. No fevers, chills, nausea, or vomiting noted at this time. 04/10/18 on evaluation today patient appears to be doing a Vazguez better in regard to her right lateral lower [name]. The slough does seem to be coming off although it is slow from the wound bed. Fortunately there does not appear to be any evidence of infection at this time which is good news. With that being said I do believe that she is going to require longer treatment with the Santyl simply due to the fact that I'm not able to really debride the wound due to the pain that she is experiencing. 04/16/18 upon evaluation today patient's wound bed currently on the lateral lower extremity on the right appears to be doing a Dubinsky better at this point. We are utilizing sensible currently. Overall I'm very pleased with how things are progressing although there still is some work to do before this will be completely healed. No fevers, chills, nausea, or vomiting noted at this time. 04/23/18 on evaluation today patient appears to be doing about the same at this point maybe just a Higley bit smaller than last week's evaluation. She continues to have some slough buildup on the surface of the wound and has very Richoux granulation unfortunately. This seems to be very slow moving  I'm getting somewhat concerned about the possibility of her having decreased  blood flow which is leading to her subsequently not being able to heal this area as quickly as she shut otherwise. She is currently utilizing Santyl. 05/01/18 on evaluation today patient appears to be doing somewhat poorly in regard to her lower extremity ulcer. At this point she's been tolerating the dressing changes to some degree without complication. With that being said she does note of the past several days that she has been having more pain unfortunately. On inspection it appears that she may have signs of infection which would account for the pain. Electronic Signature(s) Signed: 05/03/2018 1:01:02 AM By: Worthy Keeler PA-C Entered By: Worthy Keeler on 05/03/2018 00:57:58 Draeger, [name] ([id number]) -------------------------------------------------------------------------------- Physical Exam Details Patient Name: Sowder, Grover C. Date of Service: 05/01/2018 1:15 PM Medical Record Number: [id number] Patient Account Number: [id number] Date of Birth/Sex: 1928/02/02 (85 y.o. F) Treating RN: Montey Hora Primary Care Provider: Ria Bush Other Clinician: Referring Provider: Ria Bush Treating Provider/Extender: STONE III, HOYT Weeks in Treatment: 5 Constitutional Well-nourished and well-hydrated in no acute distress. Respiratory normal breathing without difficulty. clear to auscultation bilaterally. Cardiovascular regular rate and rhythm with normal S1, S2. 1+ pitting edema of the bilateral lower extremities. Psychiatric this patient is able to make decisions and demonstrates good insight into disease process. Alert and Oriented x 3. pleasant and cooperative. Notes Patient's wound bed currently shows signs of still slough covering the surface of the wound at this point. She does also have erythema surrounding the wound and the leg. This has me concerned for cellulitis and in fact I think we likely want to treat this as such today. Electronic  Signature(s) Signed: 05/03/2018 1:01:02 AM By: Worthy Keeler PA-C Entered By: Worthy Keeler on 05/03/2018 00:58:28 Therien, [name] ([id number]) -------------------------------------------------------------------------------- Physician Orders Details Patient Name: Bossi, Laurina C. Date of Service: 05/01/2018 1:15 PM Medical Record Number: [id number] Patient Account Number: [id number] Date of Birth/Sex: 1927/12/27 (85 y.o. F) Treating RN: Harold Barban Primary Care Provider: Ria Bush Other Clinician: Referring Provider: Ria Bush Treating Provider/Extender: Melburn Hake, HOYT Weeks in Treatment: 5 Verbal / Phone Orders: No Diagnosis Coding ICD-10 Coding Code Description I87.331 Chronic venous hypertension (idiopathic) with ulcer and inflammation of right lower extremity E11.622 Type 2 diabetes mellitus with other skin ulcer L97.812 Non-pressure chronic ulcer of other part of right lower leg with fat layer exposed L03.115 Cellulitis of right lower limb Wound Cleansing Wound #8 Right,Lateral Lower Leg o Clean wound with Normal Saline. Primary Wound Dressing Wound #8 Right,Lateral Lower Leg o Silver Alginate Secondary Dressing Wound #8 Right,Lateral Lower Leg o ABD and Kerlix/Conform - secured with netting Dressing Change Frequency Wound #8 Right,Lateral Lower Leg o Three times weekly Follow-up Appointments Wound #8 Right,Lateral Lower Leg o Return Appointment in 1 week. Edema Control Wound #8 Right,Lateral Lower Leg o Elevate legs to the level of the heart and pump ankles as often as possible Home Health Wound #8 Right,Lateral Lower Leg o [place] Nurse may visit PRN to address patientos wound care needs. o FACE TO FACE ENCOUNTER: MEDICARE and MEDICAID PATIENTS: I certify that this patient is under my care and that I had a face-to-face encounter that meets the physician face-to-face encounter requirements  with this patient on this date. The encounter with the patient was in whole or in part for the following MEDICAL CONDITION: (primary reason for [place]) MEDICAL NECESSITY: I certify, that  based on my findings, NURSING services are a medically necessary home health service. HOME BOUND STATUS: I certify that my clinical findings support that this patient is homebound (i.e., Due to illness or injury, pt requires aid of Guagliardo, Shontay C. ([id number]) supportive devices such as crutches, cane, wheelchairs, walkers, the use of special transportation or the assistance of another person to leave their place of residence. There is a normal inability to leave the home and doing so requires considerable and taxing effort. Other absences are for medical reasons / religious services and are infrequent or of short duration when for other reasons). o If current dressing causes regression in wound condition, may D/C ordered dressing product/s and apply Normal Saline Moist Dressing daily until next [place] / Other MD appointment. [place] of regression in wound condition at [phone]. o Please direct any NON-WOUND related issues/requests for orders to patient's Primary Care Physician Medications-please add to medication list. Wound #8 Right,Lateral Lower Leg o P.O. Antibiotics Laboratory o Bacteria identified in Wound by Culture (MICRO) - Right lower leg oooo LOINC Code: 6295-2 oooo Convenience Name: Wound culture routine Patient Medications Allergies: No Known Allergies Notifications Medication Indication Start End doxycycline hyclate 05/01/2018 DOSE 1 - oral 100 mg capsule - 1 capsule oral taken 2 times a day for 14 days Electronic Signature(s) Signed: 05/01/2018 2:55:31 PM By: Worthy Keeler PA-C Entered By: Worthy Keeler on 05/01/2018 14:55:31 Buskey, [name]  ([id number]) -------------------------------------------------------------------------------- Problem List Details Patient Name: Futrell, Makinsey C. Date of Service: 05/01/2018 1:15 PM Medical Record Number: [id number] Patient Account Number: [id number] Date of Birth/Sex: [birth year] (85 y.o. F) Treating RN: Montey Hora Primary Care Provider: Ria Bush Other Clinician: Referring Provider: Ria Bush Treating Provider/Extender: Melburn Hake, HOYT Weeks in Treatment: 5 Active Problems ICD-10 Evaluated Encounter Code Description Active Date Today Diagnosis I87.331 Chronic venous hypertension (idiopathic) with ulcer and 03/23/2018 No Yes inflammation of right lower extremity E11.622 Type 2 diabetes mellitus with other skin ulcer 03/23/2018 No Yes L97.812 Non-pressure chronic ulcer of other part of right lower leg 03/23/2018 No Yes with fat layer exposed L03.115 Cellulitis of right lower limb 03/23/2018 No Yes Inactive Problems Resolved Problems Electronic Signature(s) Signed: 05/03/2018 1:01:02 AM By: Worthy Keeler PA-C Entered By: Worthy Keeler on 05/01/2018 13:10:04 Weier, Shyloh C. ([id number]) -------------------------------------------------------------------------------- Progress Note Details Patient Name: Romberger, Hamna C. Date of Service: 05/01/2018 1:15 PM Medical Record Number: [id number] Patient Account Number: [id number] Date of Birth/Sex: 09/05/1927 (85 y.o. F) Treating RN: Montey Hora Primary Care Provider: Ria Bush Other Clinician: Referring Provider: Ria Bush Treating Provider/Extender: Melburn Hake, HOYT Weeks in Treatment: 5 Subjective Chief Complaint Information obtained from Patient RLE wounds History of Present Illness (HPI) Readmission: 03/23/18 on evaluation today patient presents for readmission entire clinic concerning issues that she has been having for the past couple of weeks in regard to her right lower extremity. The  medial injury actually occurred when her dog tried to take a blanket from her and subsequently scratched her with his toenail. Subsequently the lateral injury was a wound where she dropped a knife in her kitchen and hit the side of her leg. With that being said both have been open for some time and home health nurse that has been coming out marked for her where the redness was several days ago. Nonetheless this has spread will be on the margin of what was marked. She was placed on Keflex which was started two days ago but at this  point it does not seem to have helped with any improvement in the overall appearance of the wounds. No fevers, chills, nausea, or vomiting noted at this time. 04/02/18 Seen today for follow up and management of right lower extremity wounds. Right lateral wound with adherent slough. She is complaining of pain to touch which limited exam. Recently treated in the ED for cellulitis and wound culture positive for MRSA. Completed a dose of Vancomycin and transitioned to doxycycline by mouth. Erythema remains present with improvement since last visit. Right medial wound with scab present. No fevers, chills, nausea, or vomiting noted at this time. 04/10/18 on evaluation today patient appears to be doing a Perona better in regard to her right lateral lower [name]. The slough does seem to be coming off although it is slow from the wound bed. Fortunately there does not appear to be any evidence of infection at this time which is good news. With that being said I do believe that she is going to require longer treatment with the Santyl simply due to the fact that I'm not able to really debride the wound due to the pain that she is experiencing. 04/16/18 upon evaluation today patient's wound bed currently on the lateral lower extremity on the right appears to be doing a Gunn better at this point. We are utilizing sensible currently. Overall I'm very pleased with how things are  progressing although there still is some work to do before this will be completely healed. No fevers, chills, nausea, or vomiting noted at this time. 04/23/18 on evaluation today patient appears to be doing about the same at this point maybe just a Schwanz bit smaller than last week's evaluation. She continues to have some slough buildup on the surface of the wound and has very Ledesma granulation unfortunately. This seems to be very slow moving I'm getting somewhat concerned about the possibility of her having decreased blood flow which is leading to her subsequently not being able to heal this area as quickly as she shut otherwise. She is currently utilizing Santyl. 05/01/18 on evaluation today patient appears to be doing somewhat poorly in regard to her lower extremity ulcer. At this point she's been tolerating the dressing changes to some degree without complication. With that being said she does note of the past several days that she has been having more pain unfortunately. On inspection it appears that she may have signs of infection which would account for the pain. Patient History Information obtained from Patient. CHELLA, CHAPDELAINE ([id number]) Family History Diabetes - Father,Siblings,Child, Hypertension - Child, Lung Disease - Child, Stroke - Mother, No family history of Cancer, Heart Disease, Kidney Disease, Seizures, Thyroid Problems. Social History Former smoker, Marital Status - Single, Alcohol Use - Never, Drug Use - No History, Caffeine Use - Daily. Medical History Eyes Patient has history of Cataracts - removed Denies history of Glaucoma, Optic Neuritis Hematologic/Lymphatic Patient has history of Anemia Denies history of Hemophilia, Human Immunodeficiency Virus, Lymphedema, Sickle Cell Disease Respiratory Denies history of Aspiration, Asthma, Chronic Obstructive Pulmonary Disease (COPD), Pneumothorax, Sleep Apnea, Tuberculosis Cardiovascular Patient has history of  Arrhythmia, Coronary Artery Disease, Hypertension, Myocardial Infarction Denies history of Angina, Congestive Heart Failure, Deep Vein Thrombosis, Hypotension, Peripheral Arterial Disease, Peripheral Venous Disease, Phlebitis Endocrine Patient has history of Type II Diabetes Denies history of Type I Diabetes Genitourinary Denies history of End Stage Renal Disease Immunological Denies history of Lupus Erythematosus, Raynaud s, Scleroderma Integumentary (Skin) Denies history of History of Burn, History of  pressure wounds Musculoskeletal Patient has history of Osteoarthritis Denies history of Gout, Rheumatoid Arthritis, Osteomyelitis Neurologic Denies history of Dementia, Neuropathy, Quadriplegia, Paraplegia, Seizure Disorder Oncologic Denies history of Received Chemotherapy, Received Radiation Medical And Surgical History Notes Constitutional Symptoms (General Health) High Blood Pressure, open heart surgery, gall stone, hysterectomy; Fluid pill, Anxiety Review of Systems (ROS) Constitutional Symptoms (General Health) Denies complaints or symptoms of Fever, Chills. Respiratory The patient has no complaints or symptoms. Cardiovascular Complains or has symptoms of LE edema. Psychiatric The patient has no complaints or symptoms. Twiggs, Pasqualina C. ([id number]) Objective Constitutional Well-nourished and well-hydrated in no acute distress. Vitals Time Taken: 1:22 PM, Temperature: 97.7 F, Pulse: 73 bpm, Respiratory Rate: 16 breaths/min, Blood Pressure: 116/92 mmHg. Respiratory normal breathing without difficulty. clear to auscultation bilaterally. Cardiovascular regular rate and rhythm with normal S1, S2. 1+ pitting edema of the bilateral lower extremities. Psychiatric this patient is able to make decisions and demonstrates good insight into disease process. Alert and Oriented x 3. pleasant and cooperative. General Notes: Patient's wound bed currently shows signs of still slough  covering the surface of the wound at this point. She does also have erythema surrounding the wound and the leg. This has me concerned for cellulitis and in fact I think we likely want to treat this as such today. Integumentary (Hair, Skin) Wound #8 status is Open. Original cause of wound was Trauma. The wound is located on the Right,Lateral Lower Leg. The wound measures 4cm length x 2cm width x 0.2cm depth; 6.283cm^2 area and 1.257cm^3 volume. There is Fat Layer (Subcutaneous Tissue) Exposed exposed. There is no tunneling or undermining noted. There is a medium amount of serous drainage noted. The wound margin is flat and intact. There is small (1-33%) granulation within the wound bed. There is a large (67-100%) amount of necrotic tissue within the wound bed including Adherent Slough. The periwound skin appearance exhibited: Dry/Scaly, Erythema. The periwound skin appearance did not exhibit: Callus, Crepitus, Excoriation, Induration, Rash, Scarring, Maceration, Atrophie Blanche, Cyanosis, Ecchymosis, Hemosiderin Staining, Mottled, Pallor, Rubor. The surrounding wound skin color is noted with erythema which is circumferential. Periwound temperature was noted as No Abnormality. The periwound has tenderness on palpation. Assessment Active Problems ICD-10 Chronic venous hypertension (idiopathic) with ulcer and inflammation of right lower extremity Type 2 diabetes mellitus with other skin ulcer Non-pressure chronic ulcer of other part of right lower leg with fat layer exposed Cellulitis of right lower limb Plan Wound Cleansing: Wound #8 Right,Lateral Lower Leg: Maselli, Virgilene C. ([id number]) Clean wound with Normal Saline. Primary Wound Dressing: Wound #8 Right,Lateral Lower Leg: Silver Alginate Secondary Dressing: Wound #8 Right,Lateral Lower Leg: ABD and Kerlix/Conform - secured with netting Dressing Change Frequency: Wound #8 Right,Lateral Lower Leg: Three times weekly Follow-up  Appointments: Wound #8 Right,Lateral Lower Leg: Return Appointment in 1 week. Edema Control: Wound #8 Right,Lateral Lower Leg: Elevate legs to the level of the heart and pump ankles as often as possible Home Health: Wound #8 Right,Lateral Lower Leg: [place] Nurse may visit PRN to address patient s wound care needs. FACE TO FACE ENCOUNTER: MEDICARE and MEDICAID PATIENTS: I certify that this patient is under my care and that I had a face-to-face encounter that meets the physician face-to-face encounter requirements with this patient on this date. The encounter with the patient was in whole or in part for the following MEDICAL CONDITION: (primary reason for [place]) MEDICAL NECESSITY: I certify, that based on my findings, NURSING services are  a medically necessary home health service. HOME BOUND STATUS: I certify that my clinical findings support that this patient is homebound (i.e., Due to illness or injury, pt requires aid of supportive devices such as crutches, cane, wheelchairs, walkers, the use of special transportation or the assistance of another person to leave their place of residence. There is a normal inability to leave the home and doing so requires considerable and taxing effort. Other absences are for medical reasons / religious services and are infrequent or of short duration when for other reasons). If current dressing causes regression in wound condition, may D/C ordered dressing product/s and apply Normal Saline Moist Dressing daily until next [place] / Other MD appointment. [place] of regression in wound condition at [phone]. Please direct any NON-WOUND related issues/requests for orders to patient's Primary Care Physician Medications-please add to medication list.: Wound #8 Right,Lateral Lower Leg: P.O. Antibiotics Laboratory ordered were: Wound culture routine - Right lower leg The following  medication(s) was prescribed: doxycycline hyclate oral 100 mg capsule 1 1 capsule oral taken 2 times a day for 14 days starting 05/01/2018 My suggestion at this point is gonna be that we switch the dressing to a silver alginate dressing and I did send her an antibiotic today. Subsequently we're gonna see were things stand at follow-up in one weeks time. This could see were she may need to go to the ER for further evaluation and treatment as needed. She understands. With that being said I'm hopeful that the antibiotic will get the cellulitis under good control. Please see above for specific wound care orders. We will see patient for re-evaluation in 1 week(s) here in the clinic. If anything worsens or changes patient will contact our office for additional recommendations. Electronic Signature(s) Signed: 05/03/2018 1:01:02 AM By: Worthy Keeler PA-C Entered By: Worthy Keeler on 05/03/2018 00:58:39 Steffey, [name] ([id number]) Chuang, [name] ([id number]) -------------------------------------------------------------------------------- ROS/PFSH Details Patient Name: Burnett, Khila C. Date of Service: 05/01/2018 1:15 PM Medical Record Number: [id number] Patient Account Number: [id number] Date of Birth/Sex: 06-07-1927 (85 y.o. F) Treating RN: Montey Hora Primary Care Provider: Ria Bush Other Clinician: Referring Provider: Ria Bush Treating Provider/Extender: Melburn Hake, HOYT Weeks in Treatment: 5 Information Obtained From Patient Wound History Constitutional Symptoms (General Health) Complaints and Symptoms: Negative for: Fever; Chills Medical History: Past Medical History Notes: High Blood Pressure, open heart surgery, gall stone, hysterectomy; Fluid pill, Anxiety Cardiovascular Complaints and Symptoms: Positive for: LE edema Medical History: Positive for: Arrhythmia; Coronary Artery Disease; Hypertension; Myocardial Infarction Negative for: Angina; Congestive  Heart Failure; Deep Vein Thrombosis; Hypotension; Peripheral Arterial Disease; Peripheral Venous Disease; Phlebitis Eyes Medical History: Positive for: Cataracts - removed Negative for: Glaucoma; Optic Neuritis Hematologic/Lymphatic Medical History: Positive for: Anemia Negative for: Hemophilia; Human Immunodeficiency Virus; Lymphedema; Sickle Cell Disease Respiratory Complaints and Symptoms: No Complaints or Symptoms Medical History: Negative for: Aspiration; Asthma; Chronic Obstructive Pulmonary Disease (COPD); Pneumothorax; Sleep Apnea; Tuberculosis Endocrine Medical History: Positive for: Type II Diabetes Negative for: Type I Diabetes Corsetti, Desarie C. ([id number]) Time with diabetes: no idea Treated with: Oral agents Blood sugar tested every day: No Genitourinary Medical History: Negative for: End Stage Renal Disease Immunological Medical History: Negative for: Lupus Erythematosus; Raynaudos; Scleroderma Integumentary (Skin) Medical History: Negative for: History of Burn; History of pressure wounds Musculoskeletal Medical History: Positive for: Osteoarthritis Negative for: Gout; Rheumatoid Arthritis; Osteomyelitis Neurologic Medical History: Negative for: Dementia; Neuropathy; Quadriplegia; Paraplegia; Seizure Disorder Oncologic Medical History: Negative for: Received  Chemotherapy; Received [facility] Complaints and Symptoms: No Complaints or Symptoms HBO Extended History Items Eyes: Cataracts Immunizations Pneumococcal Vaccine: Received Pneumococcal Vaccination: Yes Implantable Devices Family and Social History Cancer: No; Diabetes: Yes - Father,Siblings,Child; Heart Disease: No; Hypertension: Yes - Child; Kidney Disease: No; Lung Disease: Yes - Child; Seizures: No; Stroke: Yes - Mother; Thyroid Problems: No; Former smoker; Marital Status - Single; Alcohol Use: Never; Drug Use: No History; Caffeine Use: Daily; Financial Concerns: No; Food,  Clothing or Shelter Needs: No; Support System Lacking: No; Transportation Concerns: No; Advanced Directives: Yes (Not Provided); Patient does not want information on Advanced Directives; Living Will: Yes (Not Provided) Physician Affirmation I have reviewed and agree with the above information. KAYLEANA, WAITES ([id number]) Electronic Signature(s) Signed: 05/03/2018 1:01:02 AM By: Worthy Keeler PA-C Signed: 05/03/2018 5:03:44 PM By: Montey Hora Entered By: Worthy Keeler on 05/03/2018 00:58:14 Deats, [name] ([id number]) -------------------------------------------------------------------------------- SuperBill Details Patient Name: Rogstad, Tkeya C. Date of Service: 05/01/2018 Medical Record Number: [id number] Patient Account Number: [id number] Date of Birth/Sex: [birth year] (85 y.o. F) Treating RN: Montey Hora Primary Care Provider: Ria Bush Other Clinician: Referring Provider: Ria Bush Treating Provider/Extender: Melburn Hake, HOYT Weeks in Treatment: 5 Diagnosis Coding ICD-10 Codes Code Description I87.331 Chronic venous hypertension (idiopathic) with ulcer and inflammation of right lower extremity E11.622 Type 2 diabetes mellitus with other skin ulcer L97.812 Non-pressure chronic ulcer of other part of right lower leg with fat layer exposed L03.115 Cellulitis of right lower limb Facility Procedures CPT4 Code: 62863817 Description: 99213 - WOUND CARE VISIT-LEV 3 EST PT Modifier: Quantity: 1 Physician Procedures CPT4: Description Modifier Quantity Code 7116579 99214 - WC PHYS LEVEL 4 - EST PT 1 ICD-10 Diagnosis Description I87.331 Chronic venous hypertension (idiopathic) with ulcer and inflammation of right lower extremity E11.622 Type 2 diabetes mellitus with  other skin ulcer L97.812 Non-pressure chronic ulcer of other part of right lower leg with fat layer exposed L03.115 Cellulitis of right lower limb Electronic Signature(s) Signed: 05/03/2018 1:01:02  AM By: Worthy Keeler PA-C Entered By: Worthy Keeler on 05/01/2018 23:49:15

## 2018-05-05 DIAGNOSIS — I2510 Atherosclerotic heart disease of native coronary artery without angina pectoris: Secondary | ICD-10-CM | POA: Diagnosis not present

## 2018-05-05 DIAGNOSIS — L97812 Non-pressure chronic ulcer of other part of right lower leg with fat layer exposed: Secondary | ICD-10-CM | POA: Diagnosis not present

## 2018-05-05 DIAGNOSIS — L97211 Non-pressure chronic ulcer of right calf limited to breakdown of skin: Secondary | ICD-10-CM | POA: Diagnosis not present

## 2018-05-05 DIAGNOSIS — E1151 Type 2 diabetes mellitus with diabetic peripheral angiopathy without gangrene: Secondary | ICD-10-CM | POA: Diagnosis not present

## 2018-05-05 DIAGNOSIS — I872 Venous insufficiency (chronic) (peripheral): Secondary | ICD-10-CM | POA: Diagnosis not present

## 2018-05-05 DIAGNOSIS — E11622 Type 2 diabetes mellitus with other skin ulcer: Secondary | ICD-10-CM | POA: Diagnosis not present

## 2018-05-07 ENCOUNTER — Encounter: Payer: Medicare Other | Attending: Physician Assistant | Admitting: Physician Assistant

## 2018-05-07 DIAGNOSIS — Z87891 Personal history of nicotine dependence: Secondary | ICD-10-CM | POA: Insufficient documentation

## 2018-05-07 DIAGNOSIS — L03115 Cellulitis of right lower limb: Secondary | ICD-10-CM | POA: Diagnosis not present

## 2018-05-07 DIAGNOSIS — I252 Old myocardial infarction: Secondary | ICD-10-CM | POA: Insufficient documentation

## 2018-05-07 DIAGNOSIS — L97812 Non-pressure chronic ulcer of other part of right lower leg with fat layer exposed: Secondary | ICD-10-CM | POA: Diagnosis not present

## 2018-05-07 DIAGNOSIS — I10 Essential (primary) hypertension: Secondary | ICD-10-CM | POA: Diagnosis not present

## 2018-05-07 DIAGNOSIS — I87331 Chronic venous hypertension (idiopathic) with ulcer and inflammation of right lower extremity: Secondary | ICD-10-CM | POA: Insufficient documentation

## 2018-05-07 DIAGNOSIS — I2510 Atherosclerotic heart disease of native coronary artery without angina pectoris: Secondary | ICD-10-CM | POA: Insufficient documentation

## 2018-05-07 DIAGNOSIS — E11622 Type 2 diabetes mellitus with other skin ulcer: Secondary | ICD-10-CM | POA: Diagnosis not present

## 2018-05-09 DIAGNOSIS — L97812 Non-pressure chronic ulcer of other part of right lower leg with fat layer exposed: Secondary | ICD-10-CM | POA: Diagnosis not present

## 2018-05-09 DIAGNOSIS — I2510 Atherosclerotic heart disease of native coronary artery without angina pectoris: Secondary | ICD-10-CM | POA: Diagnosis not present

## 2018-05-09 DIAGNOSIS — E1151 Type 2 diabetes mellitus with diabetic peripheral angiopathy without gangrene: Secondary | ICD-10-CM | POA: Diagnosis not present

## 2018-05-09 DIAGNOSIS — I872 Venous insufficiency (chronic) (peripheral): Secondary | ICD-10-CM | POA: Diagnosis not present

## 2018-05-09 DIAGNOSIS — E11622 Type 2 diabetes mellitus with other skin ulcer: Secondary | ICD-10-CM | POA: Diagnosis not present

## 2018-05-09 DIAGNOSIS — L97211 Non-pressure chronic ulcer of right calf limited to breakdown of skin: Secondary | ICD-10-CM | POA: Diagnosis not present

## 2018-05-11 DIAGNOSIS — I872 Venous insufficiency (chronic) (peripheral): Secondary | ICD-10-CM | POA: Diagnosis not present

## 2018-05-11 DIAGNOSIS — E1151 Type 2 diabetes mellitus with diabetic peripheral angiopathy without gangrene: Secondary | ICD-10-CM | POA: Diagnosis not present

## 2018-05-11 DIAGNOSIS — I2510 Atherosclerotic heart disease of native coronary artery without angina pectoris: Secondary | ICD-10-CM | POA: Diagnosis not present

## 2018-05-11 DIAGNOSIS — L97812 Non-pressure chronic ulcer of other part of right lower leg with fat layer exposed: Secondary | ICD-10-CM | POA: Diagnosis not present

## 2018-05-11 DIAGNOSIS — L97211 Non-pressure chronic ulcer of right calf limited to breakdown of skin: Secondary | ICD-10-CM | POA: Diagnosis not present

## 2018-05-11 DIAGNOSIS — E11622 Type 2 diabetes mellitus with other skin ulcer: Secondary | ICD-10-CM | POA: Diagnosis not present

## 2018-05-11 NOTE — Progress Notes (Signed)
[name], [name] ([id number]) Visit Report for 05/07/2018 Arrival Information Details Patient Name: [name] [name] C. Date of Service: 05/07/2018 1:30 PM Medical Record Number: [id number] Patient Account Number: [id number] Date of Birth/Sex: 1928/03/29 (85 y.o. F) Treating RN: [name] [name] Primary Care [name] [name]: [name] [name] Other Clinician: Referring [name] [name]: [name] [name] Treating Aydrian Halpin/Extender: [name] [name] Weeks in Treatment: 6 Visit Information History Since Last Visit Added or deleted any medications: No Patient Arrived: Walker Any new allergies or adverse reactions: No Arrival Time: 14:03 Had a fall or experienced change in No Accompanied By: self activities of daily living that may affect Transfer Assistance: None risk of falls: Patient Identification Verified: Yes Signs or symptoms of abuse/neglect since last visito No Secondary Verification Process Completed: Yes Hospitalized since last visit: No Implantable device outside of the clinic excluding No cellular tissue based products placed in the center since last visit: Has Dressing in Place as Prescribed: Yes Has Compression in Place as Prescribed: Yes Pain Present Now: No Electronic Signature(s) Signed: 05/07/2018 5:20:52 PM By: [name] [name], BSN, RN, CWS, Kim RN, BSN Entered By: [name] [name], BSN, RN, CWS, [name] on 05/07/2018 14:03:50 [name] [name] [name] ([id number]) -------------------------------------------------------------------------------- Clinic Level of Care Assessment Details Patient Name: [name] [name] C. Date of Service: 05/07/2018 1:30 PM Medical Record Number: [id number] Patient Account Number: [id number] Date of Birth/Sex: [birth year] (85 y.o. F) Treating RN: [name] [name] Primary Care [name] [name]: [name] [name] Other Clinician: Referring [name] [name]: [name] [name] Treating [name] [name]/Extender: [name] [name] Weeks in Treatment: 6 Clinic Level of Care Assessment Items TOOL 4 Quantity Score []  - Use when  only an EandM is performed on FOLLOW-UP visit 0 ASSESSMENTS - Nursing Assessment / Reassessment X - Reassessment of Co-morbidities (includes updates in patient status) 1 10 X- 1 5 Reassessment of Adherence to Treatment Plan ASSESSMENTS - Wound and Skin Assessment / Reassessment X - Simple Wound Assessment / Reassessment - one wound 1 5 []  - 0 Complex Wound Assessment / Reassessment - multiple wounds []  - 0 Dermatologic / Skin Assessment (not related to wound area) ASSESSMENTS - Focused Assessment []  - Circumferential Edema Measurements - multi extremities 0 []  - 0 Nutritional Assessment / Counseling / Intervention []  - 0 Lower Extremity Assessment (monofilament, tuning fork, pulses) []  - 0 Peripheral Arterial Disease Assessment (using hand held doppler) ASSESSMENTS - Ostomy and/or Continence Assessment and Care []  - Incontinence Assessment and Management 0 []  - 0 Ostomy Care Assessment and Management (repouching, etc.) PROCESS - Coordination of Care X - Simple Patient / Family Education for ongoing care 1 15 []  - 0 Complex (extensive) Patient / Family Education for ongoing care []  - 0 Staff obtains Programmer, systems, Records, Test Results / Process Orders X- 1 10 Staff telephones HHA, Nursing Homes / Clarify orders / etc []  - 0 Routine Transfer to another Facility (non-emergent condition) []  - 0 Routine Hospital Admission (non-emergent condition) []  - 0 New Admissions / Biomedical engineer / Ordering NPWT, Apligraf, etc. []  - 0 Emergency Hospital Admission (emergent condition) X- 1 10 Simple Discharge Coordination [name], [name] ([id number]) []  - 0 Complex (extensive) Discharge Coordination PROCESS - Special Needs []  - Pediatric / Minor Patient Management 0 []  - 0 Isolation Patient Management []  - 0 Hearing / Language / Visual special needs []  - 0 Assessment of Community assistance (transportation, D/C planning, etc.) []  - 0 Additional assistance / Altered  mentation []  - 0 Support Surface(s) Assessment (bed, cushion, seat, etc.) INTERVENTIONS - Wound Cleansing / Measurement X - Simple Wound Cleansing - one wound 1  5 []  - 0 Complex Wound Cleansing - multiple wounds X- 1 5 Wound Imaging (photographs - any number of wounds) []  - 0 Wound Tracing (instead of photographs) X- 1 5 Simple Wound Measurement - one wound []  - 0 Complex Wound Measurement - multiple wounds INTERVENTIONS - Wound Dressings []  - Small Wound Dressing one or multiple wounds 0 X- 1 15 Medium Wound Dressing one or multiple wounds []  - 0 Large Wound Dressing one or multiple wounds []  - 0 Application of Medications - topical []  - 0 Application of Medications - injection INTERVENTIONS - Miscellaneous []  - External ear exam 0 []  - 0 Specimen Collection (cultures, biopsies, blood, body fluids, etc.) []  - 0 Specimen(s) / Culture(s) sent or taken to Lab for analysis []  - 0 Patient Transfer (multiple staff / Civil Service fast streamer / Similar devices) []  - 0 Simple Staple / Suture removal (25 or less) []  - 0 Complex Staple / Suture removal (26 or more) []  - 0 Hypo / Hyperglycemic Management (close monitor of Blood Glucose) []  - 0 Ankle / Brachial Index (ABI) - do not check if billed separately X- 1 5 Vital Signs [name] [name] C. ([id number]) Has the patient been seen at the hospital within the last three years: Yes Total Score: 90 Level Of Care: New/Established - Level 3 Electronic Signature(s) Signed: 05/10/2018 11:43:02 AM By: [name] [name] Entered By: [name] [name] on 05/07/2018 15:14:14 [name] [name] ([id number]) -------------------------------------------------------------------------------- Encounter Discharge Information Details Patient Name: [name] [name] C. Date of Service: 05/07/2018 1:30 PM Medical Record Number: [id number] Patient Account Number: [id number] Date of Birth/Sex: 07/19/1927 (85 y.o. F) Treating RN: [name] [name] Primary Care [name] [name]:  [name] [name] Other Clinician: Referring [name] [name]: [name] [name] Treating [name] [name]: [name] [name] Weeks in Treatment: 6 Encounter Discharge Information Items Discharge Condition: Stable Ambulatory Status: Walker Discharge Destination: Home Transportation: Private Auto Accompanied By: daughter-in-law Schedule Follow-up Appointment: Yes Clinical Summary of Care: Electronic Signature(s) Signed: 05/07/2018 5:20:52 PM By: [name] [name], BSN, RN, CWS, Kim RN, BSN Entered By: [name] [name], BSN, RN, CWS, [name] on 05/07/2018 15:28:30 Lorey, [name] [name] ([id number]) -------------------------------------------------------------------------------- Lower Extremity Assessment Details Patient Name: [name] [name] C. Date of Service: 05/07/2018 1:30 PM Medical Record Number: [id number] Patient Account Number: [id number] Date of Birth/Sex: 03/30/1928 (85 y.o. F) Treating RN: [name] [name] Primary Care Dagmar Adcox: [name] [name] Other Clinician: Referring Belanna Manring: [name] [name] Treating Daniela Hernan/Extender: [name] [name] Weeks in Treatment: 6 Edema Assessment Assessed: [Left: No] [Right: No] [Left: Edema] [Right: :] Calf Left: Right: Point of Measurement: 30 cm From Medial Instep cm 29.7 cm Ankle Left: Right: Point of Measurement: 10 cm From Medial Instep cm 19.3 cm Vascular Assessment Pulses: Dorsalis Pedis Palpable: [Right:Yes] Posterior Tibial Extremity colors, hair growth, and conditions: Extremity Color: [Right:Normal] Hair Growth on Extremity: [Right:No] Temperature of Extremity: [Right:Warm] Capillary Refill: [Right:< 3 seconds] Toe Nail Assessment Left: Right: Thick: Yes Discolored: Yes Deformed: Yes Improper Length and Hygiene: Yes Electronic Signature(s) Signed: 05/07/2018 5:20:52 PM By: [name] [name], BSN, RN, CWS, Kim RN, BSN Entered By: [name] [name], BSN, RN, CWS, [name] on 05/07/2018 14:09:04 Berhane, [name] [name]  ([id number]) -------------------------------------------------------------------------------- Multi Wound Chart Details Patient Name: [name] [name] C. Date of Service: 05/07/2018 1:30 PM Medical Record Number: [id number] Patient Account Number: [id number] Date of Birth/Sex: Apr 04, 1928 (85 y.o. F) Treating RN: [name] [name] Primary Care Jeshurun [place]: [name] [name] Other Clinician: Referring Aniyha Tate: [name] [name] Treating Rafel Garde/Extender: [name] [name] Weeks in Treatment: 6 Vital Signs Height(in): Pulse(bpm): 74 Weight(lbs): Blood Pressure(mmHg): 150/72 Body Mass Index(BMI): Temperature(F): 98.2 Respiratory Rate 16 (breaths/min):  Photos: [8:No Photos] [N/A:N/A] Wound Location: [8:Right Lower Leg - Lateral] [N/A:N/A] Wounding Event: [8:Trauma] [N/A:N/A] Primary Etiology: [8:Diabetic Wound/Ulcer of the Lower Extremity] [N/A:N/A] Comorbid History: [8:Cataracts, Anemia, Arrhythmia, Coronary Artery Disease, Hypertension, Myocardial Infarction, Type II Diabetes, Osteoarthritis] [N/A:N/A] Date Acquired: [8:03/09/2018] [N/A:N/A] Weeks of Treatment: [8:6] [N/A:N/A] Wound Status: [8:Open] [N/A:N/A] Measurements L x W x D [8:3.8x1.7x0.2] [N/A:N/A] (cm) Area (cm) : [8:5.074] [N/A:N/A] Volume (cm) : [8:1.015] [N/A:N/A] % Reduction in Area: [8:-169.20%] [N/A:N/A] % Reduction in Volume: [8:-79.60%] [N/A:N/A] Classification: [8:Grade 2] [N/A:N/A] Exudate Amount: [8:Medium] [N/A:N/A] Exudate Type: [8:Purulent] [N/A:N/A] Exudate Color: [8:yellow, brown, green] [N/A:N/A] Wound Margin: [8:Flat and Intact] [N/A:N/A] Granulation Amount: [8:Small (1-33%)] [N/A:N/A] Necrotic Amount: [8:Large (67-100%)] [N/A:N/A] Exposed Structures: [8:Fat Layer (Subcutaneous Tissue) Exposed: Yes Fascia: No Tendon: No Muscle: No Joint: No Bone: No] [N/A:N/A] Epithelialization: [8:None] [N/A:N/A] Periwound Skin Texture: [8:Excoriation: Yes Induration: No Callus: No] [N/A:N/A] Crepitus: No Rash:  No Scarring: No Periwound Skin Moisture: Dry/Scaly: Yes N/A N/A Maceration: No Periwound Skin Color: Erythema: Yes N/A N/A Atrophie Blanche: No Cyanosis: No Ecchymosis: No Hemosiderin Staining: No Mottled: No Pallor: No Rubor: No Erythema Location: Circumferential N/A N/A Temperature: No Abnormality N/A N/A Tenderness on Palpation: Yes N/A N/A Wound Preparation: Ulcer Cleansing: N/A N/A Rinsed/Irrigated with Saline Topical Anesthetic Applied: Other: lidocaine 4% Treatment Notes Electronic Signature(s) Signed: 05/10/2018 11:43:02 AM By: [name] [name] Entered By: [name] [name] on 05/07/2018 15:09:19 Basques, [name] [name] ([id number]) -------------------------------------------------------------------------------- [place] Details Patient Name: [name] [name] C. Date of Service: 05/07/2018 1:30 PM Medical Record Number: [id number] Patient Account Number: [id number] Date of Birth/Sex: 06-Nov-1927 (85 y.o. F) Treating RN: [name] [name] Primary Care Krystiana Fornes: [name] [name] Other Clinician: Referring Edilson Vital: [name] [name] Treating Vyron Fronczak/Extender: [name] [name] Weeks in Treatment: 6 Active Inactive Abuse / Safety / Falls / Self Care Management Nursing Diagnoses: Potential for falls Goals: Patient will not experience any injury related to falls Date Initiated: 03/23/2018 Target Resolution Date: 06/09/2018 Goal Status: Active Interventions: Assess fall risk on admission and as needed Notes: Orientation to the Wound Care Program Nursing Diagnoses: Knowledge deficit related to the wound healing center program Goals: Patient/caregiver will verbalize understanding of the [facility] Program Date Initiated: 03/23/2018 Target Resolution Date: 06/09/2018 Goal Status: Active Interventions: Provide education on orientation to the wound center Notes: Pain, Acute or Chronic Nursing Diagnoses: Pain, acute or chronic: actual or  potential Goals: Patient will verbalize adequate pain control and receive pain control interventions during procedures as needed Date Initiated: 03/23/2018 Target Resolution Date: 06/09/2018 Goal Status: Active Interventions: Assess comfort goal upon admission Bostelman, Audreanna C. ([id number]) Notes: Wound/Skin Impairment Nursing Diagnoses: Impaired tissue integrity Goals: Ulcer/skin breakdown will heal within 14 weeks Date Initiated: 03/23/2018 Target Resolution Date: 06/09/2018 Goal Status: Active Interventions: Assess patient/caregiver ability to obtain necessary supplies Assess patient/caregiver ability to perform ulcer/skin care regimen upon admission and as needed Assess ulceration(s) every visit Notes: Electronic Signature(s) Signed: 05/10/2018 11:43:02 AM By: [name] [name] Entered By: [name] [name] on 05/07/2018 15:08:51 [name] [name] C. ([id number]) -------------------------------------------------------------------------------- Pain Assessment Details Patient Name: [name] [name] C. Date of Service: 05/07/2018 1:30 PM Medical Record Number: [id number] Patient Account Number: [id number] Date of Birth/Sex: [birth year] (85 y.o. F) Treating RN: [name] [name] Primary Care [place]: [name] [name] Other Clinician: Referring Roselind Klus: [name] [name] Treating Paisyn Guercio/Extender: [name] [name] Weeks in Treatment: 6 Active Problems Location of Pain Severity and Description of Pain Patient Has [name] Yes Site Locations Pain Location: Generalized Pain, Pain in Ulcers Rate the pain. Current Pain Level: 7 Character of  Pain Describe the Pain: Sharp Pain Management and Medication Current Pain Management: Electronic Signature(s) Signed: 05/07/2018 5:20:52 PM By: [name] [name], BSN, RN, CWS, Kim RN, BSN Entered By: [name] [name], BSN, RN, CWS, [name] on 05/07/2018 14:04:48 Botelho, [name] [name]  ([id number]) -------------------------------------------------------------------------------- Patient/Caregiver Education Details Patient Name: [name] [name] C. Date of Service: 05/07/2018 1:30 PM Medical Record Number: [id number] Patient Account Number: [id number] Date of Birth/Gender: 1928-02-05 (85 y.o. F) Treating RN: [name] [name] Primary Care Physician: [name] [name] Other Clinician: Referring Physician: Ria [name] Treating Physician/Extender: Sharalyn Ink in Treatment: 6 Education Assessment Education Provided To: Patient Education Topics Provided Wound/Skin Impairment: Handouts: Caring for Your Ulcer Methods: Demonstration, Explain/Verbal Responses: State content correctly Electronic Signature(s) Signed: 05/10/2018 11:43:02 AM By: [name] [name] Entered By: [name] [name] on 05/07/2018 15:09:37 [name] [name] C. ([id number]) -------------------------------------------------------------------------------- Wound Assessment Details Patient Name: Neis, [name] C. Date of Service: 05/07/2018 1:30 PM Medical Record Number: [id number] Patient Account Number: [id number] Date of Birth/Sex: [birth year] (85 y.o. F) Treating RN: [name] [name] Primary Care Kenshin Splawn: [name] [name] Other Clinician: Referring Reggie Bise: [name] [name] Treating Morty Ortwein/Extender: [name] [name] Weeks in Treatment: 6 Wound Status Wound Number: 8 Primary Diabetic Wound/Ulcer of the Lower Extremity Etiology: Wound Location: Right Lower Leg - Lateral Wound Open Wounding Event: Trauma Status: Date Acquired: 03/09/2018 Comorbid Cataracts, Anemia, Arrhythmia, Coronary Artery Weeks Of Treatment: 6 History: Disease, Hypertension, Myocardial Infarction, Clustered Wound: No Type II Diabetes, Osteoarthritis Photos Photo Uploaded By: Montey Hora on 05/07/2018 16:02:27 Wound Measurements Length: (cm) 3.8 Width: (cm) 1.7 Depth: (cm) 0.2 Area: (cm) 5.074 Volume: (cm) 1.015 %  Reduction in Area: -169.2% % Reduction in Volume: -79.6% Epithelialization: None Tunneling: No Undermining: No Wound Description Classification: Grade 2 Wound Margin: Flat and Intact Exudate Amount: Medium Exudate Type: Purulent Exudate Color: yellow, brown, green Foul Odor After Cleansing: No Slough/Fibrino Yes Wound Bed Granulation Amount: Small (1-33%) Exposed Structure Necrotic Amount: Large (67-100%) Fascia Exposed: No Necrotic Quality: Adherent Slough Fat Layer (Subcutaneous Tissue) Exposed: Yes Tendon Exposed: No Muscle Exposed: No Joint Exposed: No Bone Exposed: No Periwound Skin Texture Treadway, Annelle C. ([id number]) Texture Color No Abnormalities Noted: No No Abnormalities Noted: No Callus: No Atrophie Blanche: No Crepitus: No Cyanosis: No Excoriation: Yes Ecchymosis: No Induration: No Erythema: Yes Rash: No Erythema Location: Circumferential Scarring: No Hemosiderin Staining: No Mottled: No Moisture Pallor: No No Abnormalities Noted: No Rubor: No Dry / Scaly: Yes Maceration: No Temperature / Pain Temperature: No Abnormality Tenderness on Palpation: Yes Wound Preparation Ulcer Cleansing: Rinsed/Irrigated with Saline Topical Anesthetic Applied: Other: lidocaine 4%, Treatment Notes Wound #8 (Right, Lateral Lower Leg) Notes Silver Ag, secured with conform Electronic Signature(s) Signed: 05/07/2018 5:20:52 PM By: [name] [name], BSN, RN, CWS, Kim RN, BSN Entered By: [name] [name], BSN, RN, CWS, [name] on 05/07/2018 14:07:46 Kusek, [name] [name] ([id number]) -------------------------------------------------------------------------------- Vitals Details Patient Name: Escorcia, Kawanna C. Date of Service: 05/07/2018 1:30 PM Medical Record Number: [id number] Patient Account Number: [id number] Date of Birth/Sex: Aug 19, 1927 (85 y.o. F) Treating RN: [name] [name] Primary Care Bashir Marchetti: [name] [name] Other Clinician: Referring Seidy Labreck: [name] [name] Treating  Fiorella Hanahan/Extender: [name] [name] Weeks in Treatment: 6 Vital Signs Time Taken: 14:04 Temperature (F): 98.2 Pulse (bpm): 74 Respiratory Rate (breaths/min): 16 Blood Pressure (mmHg): 150/72 Reference Range: 80 - 120 mg / dl Airway Pulse Oximetry (%): 99 Electronic Signature(s) Signed: 05/07/2018 5:20:52 PM By: [name] [name], BSN, RN, CWS, Kim RN, BSN Entered By: [name] [name], BSN, RN, CWS, [name] on 05/07/2018 14:05:13

## 2018-05-11 NOTE — Progress Notes (Signed)
CHERA, SLIVKA ([id number]) Visit Report for 05/07/2018 Chief Complaint Document Details Patient Name: [name] [name]. Date of Service: 05/07/2018 1:30 PM Medical Record Number: [id number] Patient Account Number: [id number] Date of Birth/Sex: Sep 18, 1927 (85 y.o. F) Treating RN: Harold Barban Primary Care Provider: Ria Bush Other Clinician: Referring Provider: Ria Bush Treating Provider/Extender: Melburn Hake, HOYT Weeks in Treatment: 6 Information Obtained from: Patient Chief Complaint RLE wounds Electronic Signature(s) Signed: 05/08/2018 8:18:44 AM By: [name] [name] Entered By: [name] Keeler on 05/07/2018 11:20:06 [name] [name]. ([id number]) -------------------------------------------------------------------------------- HPI Details Patient Name: [name] [name]. Date of Service: 05/07/2018 1:30 PM Medical Record Number: [id number] Patient Account Number: [id number] Date of Birth/Sex: 06-16-1927 (85 y.o. F) Treating RN: Harold Barban Primary Care Provider: Ria Bush Other Clinician: Referring Provider: Ria Bush Treating Provider/Extender: Melburn Hake, HOYT Weeks in Treatment: 6 History of Present Illness HPI Description: Readmission: 03/23/18 on evaluation today patient presents for readmission entire clinic concerning issues that she has been having for the past couple of weeks in regard to her right lower extremity. The medial injury actually occurred when her dog tried to take a blanket from her and subsequently scratched her with his toenail. Subsequently the lateral injury was a wound where she dropped a knife in her kitchen and hit the side of her leg. With that being said both have been open for some time and home health nurse that has been coming out marked for her where the redness was several days ago. Nonetheless this has spread will be on the margin of what was marked. She was placed on Keflex which was started two days ago but  at this point it does not seem to have helped with any improvement in the overall appearance of the wounds. No fevers, chills, nausea, or vomiting noted at this time. 04/02/18 Seen today for follow up and management of right lower extremity wounds. Right lateral wound with adherent slough. She is complaining of pain to touch which limited exam. Recently treated in the ED for cellulitis and wound culture positive for MRSA. Completed a dose of Vancomycin and transitioned to doxycycline by mouth. Erythema remains present with improvement since last visit. Right medial wound with scab present. No fevers, chills, nausea, or vomiting noted at this time. 04/10/18 on evaluation today patient appears to be doing a [place] better in regard to her right lateral lower [name]. The slough does seem to be coming off although it is slow from the wound bed. Fortunately there does not appear to be any evidence of infection at this time which is good news. With that being said I do believe that she is going to require longer treatment with the Santyl simply due to the fact that I'm not able to really debride the wound due to the pain that she is experiencing. 04/16/18 upon evaluation today patient's wound bed currently on the lateral lower extremity on the right appears to be doing a [place] better at this point. We are utilizing sensible currently. Overall I'm very pleased with how things are progressing although there still is some work to do before this will be completely healed. No fevers, chills, nausea, or vomiting noted at this time. 04/23/18 on evaluation today patient appears to be doing about the same at this point maybe just a [place] bit smaller than last week's evaluation. She continues to have some slough buildup on the surface of the wound and has very [place] granulation unfortunately. This seems to be very slow moving  I'm getting somewhat concerned about the possibility of her having decreased blood  flow which is leading to her subsequently not being able to heal this area as quickly as she shut otherwise. She is currently utilizing Santyl. 05/01/18 on evaluation today patient appears to be doing somewhat poorly in regard to her lower extremity ulcer. At this point she's been tolerating the dressing changes to some degree without complication. With that being said she does note of the past several days that she has been having more pain unfortunately. On inspection it appears that she may have signs of infection which would account for the pain. 05/07/18 on evaluation today patient's wound actually appears to be doing better she's not having as much erythema in the right lower extremity as compared to last week. Fortunately there's no signs of infection this is good news as well. Overall very pleased with the progress that she has made up to this point. Electronic Signature(s) Signed: 05/08/2018 8:18:44 AM By: [name] [name] Entered By: [name] Keeler on 05/08/2018 08:13:18 [name] [name] ([id number]) [name] [name] ([id number]) -------------------------------------------------------------------------------- Physical Exam Details Patient Name: [name] [name]. Date of Service: 05/07/2018 1:30 PM Medical Record Number: [id number] Patient Account Number: [id number] Date of Birth/Sex: 03-24-1928 (85 y.o. F) Treating RN: Harold Barban Primary Care Provider: Ria Bush Other Clinician: Referring Provider: Ria Bush Treating Provider/Extender: STONE III, HOYT Weeks in Treatment: 6 Constitutional Well-nourished and well-hydrated in no acute distress. Respiratory normal breathing without difficulty. clear to auscultation bilaterally. Cardiovascular regular rate and rhythm with normal S1, S2. Psychiatric this patient is able to make decisions and demonstrates good insight into disease process. Alert and Oriented x 3. pleasant and cooperative. Notes Patient's wound  bed did not require any sharp debridement today again deter vascular status I really am avoiding this at this time. I'm in a recommend she continue with a silver alginate dressing as we been doing previous. Electronic Signature(s) Signed: 05/08/2018 8:18:44 AM By: [name] [name] Entered By: [name] Keeler on 05/08/2018 08:13:52 [name] [name] ([id number]) -------------------------------------------------------------------------------- Physician Orders Details Patient Name: [name] [name]. Date of Service: 05/07/2018 1:30 PM Medical Record Number: [id number] Patient Account Number: [id number] Date of Birth/Sex: 1927/06/30 (85 y.o. F) Treating RN: Harold Barban Primary Care Provider: Ria Bush Other Clinician: Referring Provider: Ria Bush Treating Provider/Extender: Melburn Hake, HOYT Weeks in Treatment: 6 Verbal / Phone Orders: No Diagnosis Coding ICD-10 Coding Code Description I87.331 Chronic venous hypertension (idiopathic) with ulcer and inflammation of right lower extremity E11.622 Type 2 diabetes mellitus with other skin ulcer L97.812 Non-pressure chronic ulcer of other part of right lower leg with fat layer exposed L03.115 Cellulitis of right lower limb Wound Cleansing Wound #8 Right,Lateral Lower Leg o Clean wound with Normal Saline. Primary Wound Dressing Wound #8 Right,Lateral Lower Leg o Silver Alginate Secondary Dressing Wound #8 Right,Lateral Lower Leg o ABD and Kerlix/Conform - secured with netting Dressing Change Frequency Wound #8 Right,Lateral Lower Leg o Three times weekly - Monday Patient in clinic, Tuesday and Friday Home Health Follow-up Appointments Wound #8 Right,Lateral Lower Leg o Return Appointment in 1 week. Edema Control Wound #8 Right,Lateral Lower Leg o Elevate legs to the level of the heart and pump ankles as often as possible Home Health Wound #8 Right,Lateral Lower Leg o [place] Nurse may visit PRN to address patientos wound care needs. o FACE TO FACE ENCOUNTER: MEDICARE and MEDICAID PATIENTS: I certify that this patient is  under my care and that I had a face-to-face encounter that meets the physician face-to-face encounter requirements with this patient on this date. The encounter with the patient was in whole or in part for the following MEDICAL CONDITION: (primary reason for [place]) MEDICAL NECESSITY: I certify, that based on my findings, NURSING services are a medically necessary home health service. HOME BOUND STATUS: I certify that my clinical findings support that this patient is homebound (i.e., Due to illness or injury, pt requires aid of [place], Jamya [name]. ([id number]) supportive devices such as crutches, cane, wheelchairs, walkers, the use of special transportation or the assistance of another person to leave their place of residence. There is a normal inability to leave the home and doing so requires considerable and taxing effort. Other absences are for medical reasons / religious services and are infrequent or of short duration when for other reasons). o If current dressing causes regression in wound condition, may D/[name] ordered dressing product/s and apply Normal Saline Moist Dressing daily until next [place] / Other MD appointment. [place] of regression in wound condition at [phone]. o Please direct any NON-WOUND related issues/requests for orders to patient's Primary Care Physician Medications-please add to medication list. Wound #8 Right,Lateral Lower Leg o P.O. Antibiotics - Continue taking antibiotics Electronic Signature(s) Signed: 05/08/2018 8:18:44 AM By: [name] [name] Signed: 05/10/2018 11:43:02 AM By: Harold Barban Entered By: Harold Barban on 05/07/2018 15:16:47 [place], [name] [name]  ([id number]) -------------------------------------------------------------------------------- Problem List Details Patient Name: Guardia, Lavoris [name]. Date of Service: 05/07/2018 1:30 PM Medical Record Number: [id number] Patient Account Number: [id number] Date of Birth/Sex: [birth year] (85 y.o. F) Treating RN: Harold Barban Primary Care Provider: Ria Bush Other Clinician: Referring Provider: Ria Bush Treating Provider/Extender: Melburn Hake, HOYT Weeks in Treatment: 6 Active Problems ICD-10 Evaluated Encounter Code Description Active Date Today Diagnosis I87.331 Chronic venous hypertension (idiopathic) with ulcer and 03/23/2018 No Yes inflammation of right lower extremity E11.622 Type 2 diabetes mellitus with other skin ulcer 03/23/2018 No Yes L97.812 Non-pressure chronic ulcer of other part of right lower leg 03/23/2018 No Yes with fat layer exposed L03.115 Cellulitis of right lower limb 03/23/2018 No Yes Inactive Problems Resolved Problems Electronic Signature(s) Signed: 05/08/2018 8:18:44 AM By: [name] [name] Entered By: [name] Keeler on 05/07/2018 11:19:59 [name] [name] ([id number]) -------------------------------------------------------------------------------- Progress Note Details Patient Name: Vazguez, Zaniah [name]. Date of Service: 05/07/2018 1:30 PM Medical Record Number: [id number] Patient Account Number: [id number] Date of Birth/Sex: [birth year] (85 y.o. F) Treating RN: Harold Barban Primary Care Provider: Ria Bush Other Clinician: Referring Provider: Ria Bush Treating Provider/Extender: Melburn Hake, HOYT Weeks in Treatment: 6 Subjective Chief Complaint Information obtained from Patient RLE wounds History of Present Illness (HPI) Readmission: 03/23/18 on evaluation today patient presents for readmission entire clinic concerning issues that she has been having for the past couple of weeks in regard to her right lower extremity. The  medial injury actually occurred when her dog tried to take a blanket from her and subsequently scratched her with his toenail. Subsequently the lateral injury was a wound where she dropped a knife in her kitchen and hit the side of her leg. With that being said both have been open for some time and home health nurse that has been coming out marked for her where the redness was several days ago. Nonetheless this has spread will be on the margin of what was marked. She was placed on Keflex which was started two days ago  but at this point it does not seem to have helped with any improvement in the overall appearance of the wounds. No fevers, chills, nausea, or vomiting noted at this time. 04/02/18 Seen today for follow up and management of right lower extremity wounds. Right lateral wound with adherent slough. She is complaining of pain to touch which limited exam. Recently treated in the ED for cellulitis and wound culture positive for MRSA. Completed a dose of Vancomycin and transitioned to doxycycline by mouth. Erythema remains present with improvement since last visit. Right medial wound with scab present. No fevers, chills, nausea, or vomiting noted at this time. 04/10/18 on evaluation today patient appears to be doing a [place] better in regard to her right lateral lower [name]. The slough does seem to be coming off although it is slow from the wound bed. Fortunately there does not appear to be any evidence of infection at this time which is good news. With that being said I do believe that she is going to require longer treatment with the Santyl simply due to the fact that I'm not able to really debride the wound due to the pain that she is experiencing. 04/16/18 upon evaluation today patient's wound bed currently on the lateral lower extremity on the right appears to be doing a [place] better at this point. We are utilizing sensible currently. Overall I'm very pleased with how things are  progressing although there still is some work to do before this will be completely healed. No fevers, chills, nausea, or vomiting noted at this time. 04/23/18 on evaluation today patient appears to be doing about the same at this point maybe just a [place] bit smaller than last week's evaluation. She continues to have some slough buildup on the surface of the wound and has very [place] granulation unfortunately. This seems to be very slow moving I'm getting somewhat concerned about the possibility of her having decreased blood flow which is leading to her subsequently not being able to heal this area as quickly as she shut otherwise. She is currently utilizing Santyl. 05/01/18 on evaluation today patient appears to be doing somewhat poorly in regard to her lower extremity ulcer. At this point she's been tolerating the dressing changes to some degree without complication. With that being said she does note of the past several days that she has been having more pain unfortunately. On inspection it appears that she may have signs of infection which would account for the pain. 05/07/18 on evaluation today patient's wound actually appears to be doing better she's not having as much erythema in the right lower extremity as compared to last week. Fortunately there's no signs of infection this is good news as well. Overall very pleased with the progress that she has made up to this point. SWAN, FAIRFAX ([id number]) Patient History Information obtained from Patient. Family History Diabetes - Father,Siblings,Child, Hypertension - Child, Lung Disease - Child, Stroke - Mother, No family history of Cancer, Heart Disease, Kidney Disease, Seizures, Thyroid Problems. Social History Former smoker, Marital Status - Single, Alcohol Use - Never, Drug Use - No History, Caffeine Use - Daily. Medical History Eyes Patient has history of Cataracts - removed Denies history of Glaucoma, Optic  Neuritis Hematologic/Lymphatic Patient has history of Anemia Denies history of Hemophilia, Human Immunodeficiency Virus, Lymphedema, Sickle Cell Disease Respiratory Denies history of Aspiration, Asthma, Chronic Obstructive Pulmonary Disease (COPD), Pneumothorax, Sleep Apnea, Tuberculosis Cardiovascular Patient has history of Arrhythmia, Coronary Artery Disease, Hypertension, Myocardial Infarction Denies history  of Angina, Congestive Heart Failure, Deep Vein Thrombosis, Hypotension, Peripheral Arterial Disease, Peripheral Venous Disease, Phlebitis Endocrine Patient has history of Type II Diabetes Denies history of Type I Diabetes Genitourinary Denies history of End Stage Renal Disease Immunological Denies history of Lupus Erythematosus, Raynaud s, Scleroderma Integumentary (Skin) Denies history of History of Burn, History of pressure wounds Musculoskeletal Patient has history of Osteoarthritis Denies history of Gout, Rheumatoid Arthritis, Osteomyelitis Neurologic Denies history of Dementia, Neuropathy, Quadriplegia, Paraplegia, Seizure Disorder Oncologic Denies history of Received Chemotherapy, Received Radiation Medical And Surgical History Notes Constitutional Symptoms (General Health) High Blood Pressure, open heart surgery, gall stone, hysterectomy; Fluid pill, Anxiety Review of Systems (ROS) Constitutional Symptoms (General Health) Denies complaints or symptoms of Fever, Chills. Respiratory The patient has no complaints or symptoms. Cardiovascular The patient has no complaints or symptoms. Psychiatric The patient has no complaints or symptoms. [name] [name]. ([id number]) Objective Constitutional Well-nourished and well-hydrated in no acute distress. Vitals Time Taken: 2:04 PM, Temperature: 98.2 F, Pulse: 74 bpm, Respiratory Rate: 16 breaths/min, Blood Pressure: 150/72 mmHg, Pulse Oximetry: 99 %. Respiratory normal breathing without difficulty. clear to  auscultation bilaterally. Cardiovascular regular rate and rhythm with normal S1, S2. Psychiatric this patient is able to make decisions and demonstrates good insight into disease process. Alert and Oriented x 3. pleasant and cooperative. General Notes: Patient's wound bed did not require any sharp debridement today again deter vascular status I really am avoiding this at this time. I'm in a recommend she continue with a silver alginate dressing as we been doing previous. Integumentary (Hair, Skin) Wound #8 status is Open. Original cause of wound was Trauma. The wound is located on the Right,Lateral Lower Leg. The wound measures 3.8cm length x 1.7cm width x 0.2cm depth; 5.074cm^2 area and 1.015cm^3 volume. There is Fat Layer (Subcutaneous Tissue) Exposed exposed. There is no tunneling or undermining noted. There is a medium amount of purulent drainage noted. The wound margin is flat and intact. There is small (1-33%) granulation within the wound bed. There is a large (67-100%) amount of necrotic tissue within the wound bed including Adherent Slough. The periwound skin appearance exhibited: Excoriation, Dry/Scaly, Erythema. The periwound skin appearance did not exhibit: Callus, Crepitus, Induration, Rash, Scarring, Maceration, Atrophie Blanche, Cyanosis, Ecchymosis, Hemosiderin Staining, Mottled, Pallor, Rubor. The surrounding wound skin color is noted with erythema which is circumferential. Periwound temperature was noted as No Abnormality. The periwound has tenderness on palpation. Assessment Active Problems ICD-10 Chronic venous hypertension (idiopathic) with ulcer and inflammation of right lower extremity Type 2 diabetes mellitus with other skin ulcer Non-pressure chronic ulcer of other part of right lower leg with fat layer exposed Cellulitis of right lower limb [name] [name]. ([id number]) Plan Wound Cleansing: Wound #8 Right,Lateral Lower Leg: Clean wound with Normal  Saline. Primary Wound Dressing: Wound #8 Right,Lateral Lower Leg: Silver Alginate Secondary Dressing: Wound #8 Right,Lateral Lower Leg: ABD and Kerlix/Conform - secured with netting Dressing Change Frequency: Wound #8 Right,Lateral Lower Leg: Three times weekly - Monday Patient in clinic, Tuesday and Friday Home Health Follow-up Appointments: Wound #8 Right,Lateral Lower Leg: Return Appointment in 1 week. Edema Control: Wound #8 Right,Lateral Lower Leg: Elevate legs to the level of the heart and pump ankles as often as possible Home Health: Wound #8 Right,Lateral Lower Leg: [place] Nurse may visit PRN to address patient s wound care needs. FACE TO FACE ENCOUNTER: MEDICARE and MEDICAID PATIENTS: I certify that this patient is under my care and  that I had a face-to-face encounter that meets the physician face-to-face encounter requirements with this patient on this date. The encounter with the patient was in whole or in part for the following MEDICAL CONDITION: (primary reason for [place]) MEDICAL NECESSITY: I certify, that based on my findings, NURSING services are a medically necessary home health service. HOME BOUND STATUS: I certify that my clinical findings support that this patient is homebound (i.e., Due to illness or injury, pt requires aid of supportive devices such as crutches, cane, wheelchairs, walkers, the use of special transportation or the assistance of another person to leave their place of residence. There is a normal inability to leave the home and doing so requires considerable and taxing effort. Other absences are for medical reasons / religious services and are infrequent or of short duration when for other reasons). If current dressing causes regression in wound condition, may D/[name] ordered dressing product/s and apply Normal Saline Moist Dressing daily until next [place] / Other MD appointment. [place] of regression in wound condition at [phone]. Please direct any NON-WOUND related issues/requests for orders to patient's Primary Care Physician Medications-please add to medication list.: Wound #8 Right,Lateral Lower Leg: P.O. Antibiotics - Continue taking antibiotics My suggestion currently again is gonna be that we continue with the above wound care measures she's in agreement that plan. If anything changes or worsens she will let me know otherwise we're gonna see were things stand in one weeks time. Please see above for specific wound care orders. We will see patient for re-evaluation in 1 week(s) here in the clinic. If anything worsens or changes patient will contact our office for additional recommendations. Electronic Signature(s) Signed: 05/08/2018 8:18:44 AM By: [name] [name] Entered By: [name] Keeler on 05/08/2018 08:14:05 [name] [name] ([id number]) -------------------------------------------------------------------------------- ROS/PFSH Details Patient Name: [name] [name]. Date of Service: 05/07/2018 1:30 PM Medical Record Number: [id number] Patient Account Number: [id number] Date of Birth/Sex: 12-25-1927 (85 y.o. F) Treating RN: Harold Barban Primary Care Provider: Ria Bush Other Clinician: Referring Provider: Ria Bush Treating Provider/Extender: Melburn Hake, HOYT Weeks in Treatment: 6 Information Obtained From Patient Wound History Constitutional Symptoms (General Health) Complaints and Symptoms: Negative for: Fever; Chills Medical History: Past Medical History Notes: High Blood Pressure, open heart surgery, gall stone, hysterectomy; Fluid pill, Anxiety Eyes Medical History: Positive for: Cataracts - removed Negative for: Glaucoma; Optic Neuritis Hematologic/Lymphatic Medical History: Positive for: Anemia Negative for: Hemophilia; Human Immunodeficiency Virus; Lymphedema; Sickle Cell Disease Respiratory Complaints and  Symptoms: No Complaints or Symptoms Medical History: Negative for: Aspiration; Asthma; Chronic Obstructive Pulmonary Disease (COPD); Pneumothorax; Sleep Apnea; Tuberculosis Cardiovascular Complaints and Symptoms: No Complaints or Symptoms Medical History: Positive for: Arrhythmia; Coronary Artery Disease; Hypertension; Myocardial Infarction Negative for: Angina; Congestive Heart Failure; Deep Vein Thrombosis; Hypotension; Peripheral Arterial Disease; Peripheral Venous Disease; Phlebitis Endocrine Medical History: Positive for: Type II Diabetes Negative for: Type I Diabetes [place], [name] [name]. ([id number]) Time with diabetes: no idea Treated with: Oral agents Blood sugar tested every day: No Genitourinary Medical History: Negative for: End Stage Renal Disease Immunological Medical History: Negative for: Lupus Erythematosus; Raynaudos; Scleroderma Integumentary (Skin) Medical History: Negative for: History of Burn; History of pressure wounds Musculoskeletal Medical History: Positive for: Osteoarthritis Negative for: Gout; Rheumatoid Arthritis; Osteomyelitis Neurologic Medical History: Negative for: Dementia; Neuropathy; Quadriplegia; Paraplegia; Seizure Disorder Oncologic Medical History: Negative for: Received Chemotherapy; Received Radiation Psychiatric Complaints and Symptoms: No Complaints or Symptoms HBO Extended History Items Eyes: Cataracts  Immunizations Pneumococcal Vaccine: Received Pneumococcal Vaccination: Yes Implantable Devices Family and Social History Cancer: No; Diabetes: Yes - Father,Siblings,Child; Heart Disease: No; Hypertension: Yes - Child; Kidney Disease: No; Lung Disease: Yes - Child; Seizures: No; Stroke: Yes - Mother; Thyroid Problems: No; Former smoker; Marital Status - Single; Alcohol Use: Never; Drug Use: No History; Caffeine Use: Daily; Financial Concerns: No; Food, Clothing or Shelter Needs: No; Support System Lacking: No; Transportation  Concerns: No; Advanced Directives: Yes (Not Provided); Patient does not want information on Advanced Directives; Living Will: Yes (Not Provided) Physician Affirmation I have reviewed and agree with the above information. ETHYLE, TIEDT ([id number]) Electronic Signature(s) Signed: 05/08/2018 8:18:44 AM By: [name] [name] Signed: 05/10/2018 11:43:02 AM By: Harold Barban Entered By: [name] Keeler on 05/08/2018 08:13:34 Hartland, [name] [name] ([id number]) -------------------------------------------------------------------------------- SuperBill Details Patient Name: Vandewater, Caramia [name]. Date of Service: 05/07/2018 Medical Record Number: [id number] Patient Account Number: [id number] Date of Birth/Sex: [birth year] (85 y.o. F) Treating RN: Harold Barban Primary Care Provider: Ria Bush Other Clinician: Referring Provider: Ria Bush Treating Provider/Extender: Melburn Hake, HOYT Weeks in Treatment: 6 Diagnosis Coding ICD-10 Codes Code Description I87.331 Chronic venous hypertension (idiopathic) with ulcer and inflammation of right lower extremity E11.622 Type 2 diabetes mellitus with other skin ulcer L97.812 Non-pressure chronic ulcer of other part of right lower leg with fat layer exposed L03.115 Cellulitis of right lower limb Facility Procedures CPT4 Code: 82800349 Description: 99213 - WOUND CARE VISIT-LEV 3 EST PT Modifier: Quantity: 1 Physician Procedures CPT4: Description Modifier Quantity Code 1791505 99214 - WC PHYS LEVEL 4 - EST PT 1 ICD-10 Diagnosis Description I87.331 Chronic venous hypertension (idiopathic) with ulcer and inflammation of right lower extremity E11.622 Type 2 diabetes mellitus with  other skin ulcer L97.812 Non-pressure chronic ulcer of other part of right lower leg with fat layer exposed L03.115 Cellulitis of right lower limb Electronic Signature(s) Signed: 05/08/2018 8:18:44 AM By: [name] [name] Entered By: [name] Keeler on 05/07/2018  23:55:46

## 2018-05-14 ENCOUNTER — Encounter: Payer: Medicare Other | Admitting: Physician Assistant

## 2018-05-14 DIAGNOSIS — Z87891 Personal history of nicotine dependence: Secondary | ICD-10-CM | POA: Diagnosis not present

## 2018-05-14 DIAGNOSIS — L97812 Non-pressure chronic ulcer of other part of right lower leg with fat layer exposed: Secondary | ICD-10-CM | POA: Diagnosis not present

## 2018-05-14 DIAGNOSIS — E11622 Type 2 diabetes mellitus with other skin ulcer: Secondary | ICD-10-CM | POA: Diagnosis not present

## 2018-05-14 DIAGNOSIS — I252 Old myocardial infarction: Secondary | ICD-10-CM | POA: Diagnosis not present

## 2018-05-14 DIAGNOSIS — L03115 Cellulitis of right lower limb: Secondary | ICD-10-CM | POA: Diagnosis not present

## 2018-05-14 DIAGNOSIS — I87331 Chronic venous hypertension (idiopathic) with ulcer and inflammation of right lower extremity: Secondary | ICD-10-CM | POA: Diagnosis not present

## 2018-05-15 DIAGNOSIS — E11622 Type 2 diabetes mellitus with other skin ulcer: Secondary | ICD-10-CM | POA: Diagnosis not present

## 2018-05-15 DIAGNOSIS — L97211 Non-pressure chronic ulcer of right calf limited to breakdown of skin: Secondary | ICD-10-CM | POA: Diagnosis not present

## 2018-05-15 DIAGNOSIS — L97812 Non-pressure chronic ulcer of other part of right lower leg with fat layer exposed: Secondary | ICD-10-CM | POA: Diagnosis not present

## 2018-05-15 DIAGNOSIS — E1151 Type 2 diabetes mellitus with diabetic peripheral angiopathy without gangrene: Secondary | ICD-10-CM | POA: Diagnosis not present

## 2018-05-15 DIAGNOSIS — I872 Venous insufficiency (chronic) (peripheral): Secondary | ICD-10-CM | POA: Diagnosis not present

## 2018-05-15 DIAGNOSIS — I2510 Atherosclerotic heart disease of native coronary artery without angina pectoris: Secondary | ICD-10-CM | POA: Diagnosis not present

## 2018-05-16 ENCOUNTER — Other Ambulatory Visit: Payer: Self-pay

## 2018-05-16 MED ORDER — HYDROCODONE-ACETAMINOPHEN 5-325 MG PO TABS: 1.0000 | ORAL_TABLET | Freq: Three times a day (TID) | ORAL | 0 refills | Status: DC | PRN

## 2018-05-16 NOTE — Progress Notes (Signed)
SOKHNA, CHRISTOPH ([id number]) Visit Report for 05/14/2018 Arrival Information Details Patient Name: [name], [name] Date of Service: 05/14/2018 2:30 PM Medical Record Number: [id number] Patient Account Number: [id number] Date of Birth/Sex: [birth year] (84 y.o. F) Treating RN: Army Melia Primary Care Izaan Kingbird: Ria Bush Other Clinician: Referring Kaleiyah Polsky: Ria Bush Treating Keera Altidor/Extender: Melburn Hake, HOYT Weeks in Treatment: 7 Visit Information History Since Last Visit Added or deleted any medications: No Patient Arrived: Walker Any new allergies or adverse reactions: No Arrival Time: 15:03 Had a fall or experienced change in No Accompanied By: daughter in law activities of daily living that may affect Transfer Assistance: None risk of falls: Patient Identification Verified: Yes Signs or symptoms of abuse/neglect since last visito No Hospitalized since last visit: No Has Dressing in Place as Prescribed: Yes Pain Present Now: Yes Electronic Signature(s) Signed: 05/14/2018 4:51:21 PM By: Army Melia Entered By: Army Melia on 05/14/2018 15:04:49 Sandate, Charnika CMarland Kitchen ([id number]) -------------------------------------------------------------------------------- Lower Extremity Assessment Details Patient Name: Bisceglia, Gaylin C. Date of Service: 05/14/2018 2:30 PM Medical Record Number: [id number] Patient Account Number: [id number] Date of Birth/Sex: [birth year] (85 y.o. F) Treating RN: Army Melia Primary Care Zalen Sequeira: Ria Bush Other Clinician: Referring Maddoxx Burkitt: Ria Bush Treating Zannie Runkle/Extender: Melburn Hake, HOYT Weeks in Treatment: 7 Edema Assessment Assessed: [Left: No] [Right: No] [Left: Edema] [Right: :] Calf Left: Right: Point of Measurement: 30 cm From Medial Instep cm 29.5 cm Ankle Left: Right: Point of Measurement: 10 cm From Medial Instep cm 19 cm Vascular Assessment Pulses: Dorsalis Pedis Palpable: [Right:Yes] Posterior  Tibial Extremity colors, hair growth, and conditions: Extremity Color: [Right:Dusky] Hair Growth on Extremity: [Right:No] Temperature of Extremity: [Right:Warm] Capillary Refill: [Right:< 3 seconds] Toe Nail Assessment Left: Right: Thick: Yes Discolored: Yes Deformed: No Improper Length and Hygiene: No Electronic Signature(s) Signed: 05/14/2018 4:51:21 PM By: Army Melia Entered By: Army Melia on 05/14/2018 15:12:54 Berrios, Nasiya C. ([id number]) -------------------------------------------------------------------------------- Multi Wound Chart Details Patient Name: [name], [name] Date of Service: 05/14/2018 2:30 PM Medical Record Number: [id number] Patient Account Number: [id number] Date of Birth/Sex: 17-Feb-1928 (85 y.o. F) Treating RN: Cornell Barman Primary Care Maloni Musleh: Ria Bush Other Clinician: Referring Cherae Marton: Ria Bush Treating Clinton Wahlberg/Extender: Melburn Hake, HOYT Weeks in Treatment: 7 Vital Signs Height(in): 58 Pulse(bpm): 91 Weight(lbs): 140 Blood Pressure(mmHg): 135/74 Body Mass Index(BMI): 29 Temperature(F): 98.1 Respiratory Rate 16 (breaths/min): Photos: [8:No Photos] [N/A:N/A] Wound Location: [8:Right Lower Leg - Lateral] [N/A:N/A] Wounding Event: [8:Trauma] [N/A:N/A] Primary Etiology: [8:Diabetic Wound/Ulcer of the Lower Extremity] [N/A:N/A] Comorbid History: [8:Cataracts, Anemia, Arrhythmia, Coronary Artery Disease, Hypertension, Myocardial Infarction, Type II Diabetes, Osteoarthritis] [N/A:N/A] Date Acquired: [8:03/09/2018] [N/A:N/A] Weeks of Treatment: [8:7] [N/A:N/A] Wound Status: [8:Open] [N/A:N/A] Measurements L x W x D [8:3.9x1.7x0.2] [N/A:N/A] (cm) Area (cm) : [8:5.207] [N/A:N/A] Volume (cm) : [8:1.041] [N/A:N/A] % Reduction in Area: [8:-176.20%] [N/A:N/A] % Reduction in Volume: [8:-84.20%] [N/A:N/A] Classification: [8:Grade 2] [N/A:N/A] Exudate Amount: [8:Medium] [N/A:N/A] Exudate Type: [8:Purulent] [N/A:N/A] Exudate  Color: [8:yellow, brown, green] [N/A:N/A] Wound Margin: [8:Flat and Intact] [N/A:N/A] Granulation Amount: [8:Small (1-33%)] [N/A:N/A] Necrotic Amount: [8:Large (67-100%)] [N/A:N/A] Exposed Structures: [8:Fat Layer (Subcutaneous Tissue) Exposed: Yes Fascia: No Tendon: No Muscle: No Joint: No Bone: No] [N/A:N/A] Epithelialization: [8:None] [N/A:N/A] Periwound Skin Texture: [8:Excoriation: Yes Induration: No Callus: No] [N/A:N/A] Crepitus: No Rash: No Scarring: No Periwound Skin Moisture: Dry/Scaly: Yes N/A N/A Maceration: No Periwound Skin Color: Erythema: Yes N/A N/A Atrophie Blanche: No Cyanosis: No Ecchymosis: No Hemosiderin Staining: No Mottled: No Pallor: No Rubor: No Erythema Location: Circumferential N/A N/A Temperature: No Abnormality N/A N/A  Tenderness on Palpation: Yes N/A N/A Wound Preparation: Ulcer Cleansing: N/A N/A Rinsed/Irrigated with Saline Topical Anesthetic Applied: Other: lidocaine 4% Treatment Notes Electronic Signature(s) Signed: 05/15/2018 11:53:49 AM By: Gretta Cool, BSN, RN, CWS, Kim RN, BSN Entered By: Gretta Cool, BSN, RN, CWS, Kim on 05/14/2018 15:30:57 Schetter, [name] ([id number]) -------------------------------------------------------------------------------- Multi-Disciplinary Care Plan Details Patient Name: [name], [name] Date of Service: 05/14/2018 2:30 PM Medical Record Number: [id number] Patient Account Number: [id number] Date of Birth/Sex: 1928/01/05 (85 y.o. F) Treating RN: Cornell Barman Primary Care Berdina Cheever: Ria Bush Other Clinician: Referring Tareq Dwan: Ria Bush Treating Jase Himmelberger/Extender: Melburn Hake, HOYT Weeks in Treatment: 7 Active Inactive Abuse / Safety / Falls / Self Care Management Nursing Diagnoses: Potential for falls Goals: Patient will not experience any injury related to falls Date Initiated: 03/23/2018 Target Resolution Date: 06/09/2018 Goal Status: Active Interventions: Assess fall risk on admission and as  needed Notes: Orientation to the Wound Care Program Nursing Diagnoses: Knowledge deficit related to the wound healing center program Goals: Patient/caregiver will verbalize understanding of the [place] [facility] Date Initiated: 03/23/2018 Target Resolution Date: 06/09/2018 Goal Status: Active Interventions: Provide education on orientation to the wound center Notes: Pain, Acute or Chronic Nursing Diagnoses: Pain, acute or chronic: actual or potential Goals: Patient will verbalize adequate pain control and receive pain control interventions during procedures as needed Date Initiated: 03/23/2018 Target Resolution Date: 06/09/2018 Goal Status: Active Interventions: Assess comfort goal upon admission Brackens, Linell C. ([id number]) Notes: Wound/Skin Impairment Nursing Diagnoses: Impaired tissue integrity Goals: Ulcer/skin breakdown will heal within 14 weeks Date Initiated: 03/23/2018 Target Resolution Date: 06/09/2018 Goal Status: Active Interventions: Assess patient/caregiver ability to obtain necessary supplies Assess patient/caregiver ability to perform ulcer/skin care regimen upon admission and as needed Assess ulceration(s) every visit Notes: Electronic Signature(s) Signed: 05/15/2018 11:53:49 AM By: Gretta Cool, BSN, RN, CWS, Kim RN, BSN Entered By: Gretta Cool, BSN, RN, CWS, Kim on 05/14/2018 15:30:44 Glastetter, [name] ([id number]) -------------------------------------------------------------------------------- Pain Assessment Details Patient Name: Schake, Rojean C. Date of Service: 05/14/2018 2:30 PM Medical Record Number: [id number] Patient Account Number: [id number] Date of Birth/Sex: 1927/04/27 (85 y.o. F) Treating RN: Army Melia Primary Care Shonteria Abeln: Ria Bush Other Clinician: Referring Kellianne Ek: Ria Bush Treating Jed Kutch/Extender: Melburn Hake, HOYT Weeks in Treatment: 7 Active Problems Location of Pain Severity and Description of Pain Patient Has  [name] Yes Site Locations Pain Location: Pain in Ulcers Duration of the Pain. Constant / Intermittento Intermittent Rate the pain. Current Pain Level: 4 Character of Pain Describe the Pain: Tender Pain Management and Medication Current Pain Management: Electronic Signature(s) Signed: 05/14/2018 4:51:21 PM By: Army Melia Entered By: Army Melia on 05/14/2018 15:05:23 Bisping, [name] ([id number]) -------------------------------------------------------------------------------- Patient/Caregiver Education Details Patient Name: Goller, Claryssa C. Date of Service: 05/14/2018 2:30 PM Medical Record Number: [id number] Patient Account Number: [id number] Date of Birth/Gender: [birth year] (85 y.o. F) Treating RN: Cornell Barman Primary Care Physician: Ria Bush Other Clinician: Referring Physician: Ria Bush Treating Physician/Extender: Sharalyn Ink in Treatment: 7 Education Assessment Education Provided To: Patient Education Topics Provided Wound Debridement: Handouts: Wound Debridement Methods: Demonstration, Explain/Verbal Responses: State content correctly Wound/Skin Impairment: Handouts: Caring for Your Ulcer Methods: Demonstration, Explain/Verbal Responses: State content correctly Electronic Signature(s) Signed: 05/15/2018 11:53:49 AM By: Gretta Cool, BSN, RN, CWS, Kim RN, BSN Entered By: Gretta Cool, BSN, RN, CWS, Kim on 05/14/2018 15:39:26 Happ, [name] ([id number]) -------------------------------------------------------------------------------- Wound Assessment Details Patient Name: Bellina, Shiann C. Date of Service: 05/14/2018 2:30 PM Medical Record Number: [id number] Patient Account Number: [id number] Date of Birth/Sex: 1927-04-18 (85  y.o. F) Treating RN: Army Melia Primary Care Emileo Semel: Ria Bush Other Clinician: Referring Stephfon Bovey: Ria Bush Treating Lavi Sheehan/Extender: STONE III, HOYT Weeks in Treatment: 7 Wound Status Wound Number: [id number]  Primary Diabetic Wound/Ulcer of the Lower Extremity Etiology: Wound Location: Right Lower Leg - Lateral Wound Open Wounding Event: Trauma Status: Date Acquired: 03/09/2018 Comorbid Cataracts, Anemia, Arrhythmia, Coronary Artery Weeks Of Treatment: 7 History: Disease, Hypertension, Myocardial Infarction, Clustered Wound: No Type II Diabetes, Osteoarthritis Wound Measurements Length: (cm) 3.9 Width: (cm) 1.7 Depth: (cm) 0.2 Area: (cm) 5.207 Volume: (cm) 1.041 % Reduction in Area: -176.2% % Reduction in Volume: -84.2% Epithelialization: None Tunneling: No Undermining: No Wound Description Classification: Grade 2 Foul Od Wound Margin: Flat and Intact Slough/ Exudate Amount: Medium Exudate Type: Purulent Exudate Color: yellow, brown, green or After Cleansing: No Fibrino Yes Wound Bed Granulation Amount: Small (1-33%) Exposed Structure Necrotic Amount: Large (67-100%) Fascia Exposed: No Necrotic Quality: Adherent Slough Fat Layer (Subcutaneous Tissue) Exposed: Yes Tendon Exposed: No Muscle Exposed: No Joint Exposed: No Bone Exposed: No Periwound Skin Texture Texture Color No Abnormalities Noted: No No Abnormalities Noted: No Callus: No Atrophie Blanche: No Crepitus: No Cyanosis: No Excoriation: Yes Ecchymosis: No Induration: No Erythema: Yes Rash: No Erythema Location: Circumferential Scarring: No Hemosiderin Staining: No Mottled: No Moisture Pallor: No No Abnormalities Noted: No Rubor: No Dry / Scaly: Yes Maceration: No Temperature / Pain Cochrane, Seleta C. ([id number]) Temperature: No Abnormality Tenderness on Palpation: Yes Wound Preparation Ulcer Cleansing: Rinsed/Irrigated with Saline Topical Anesthetic Applied: Other: lidocaine 4%, Electronic Signature(s) Signed: 05/14/2018 4:51:21 PM By: Army Melia Entered By: Army Melia on 05/14/2018 15:11:08 Mesch, [name]  ([id number]) -------------------------------------------------------------------------------- Vitals Details Patient Name: Bernardini, Rolanda C. Date of Service: 05/14/2018 2:30 PM Medical Record Number: [id number] Patient Account Number: [id number] Date of Birth/Sex: 1927-12-24 (85 y.o. F) Treating RN: Army Melia Primary Care Mirabelle Cyphers: Ria Bush Other Clinician: Referring Nayib Remer: Ria Bush Treating Avonda Toso/Extender: STONE III, HOYT Weeks in Treatment: 7 Vital Signs Time Taken: 15:05 Temperature (F): 98.1 Height (in): 58 Pulse (bpm): 64 Source: Stated Respiratory Rate (breaths/min): 16 Weight (lbs): 140 Blood Pressure (mmHg): 135/74 Source: Stated Reference Range: 80 - 120 mg / dl Body Mass Index (BMI): 29.3 Airway Electronic Signature(s) Signed: 05/14/2018 4:51:21 PM By: Army Melia Entered By: Army Melia on 05/14/2018 15:06:14

## 2018-05-16 NOTE — Telephone Encounter (Signed)
Eprescribed.

## 2018-05-16 NOTE — Telephone Encounter (Signed)
Name of Medication: Hydrocodone apap 5-325 Name of Pharmacy: [facility] or Written Date and Quantity: # 16 on 04/17/18 Last Office Visit and Type: 04/27/18 acute; 03/13/18 FU Next Office Visit and Type:02/01/19 annual  Last Controlled Substance Agreement Date: 10/16/17 Last UDS:10/11/17  pt has enough med to last thru 05/16/18.

## 2018-05-16 NOTE — Telephone Encounter (Signed)
Pt notified that hydrocodone apap rx was sent electronically to [facility]. Pt voiced understanding.

## 2018-05-18 NOTE — Progress Notes (Signed)
RIVEN, BEEBE ([id number]) Visit Report for 05/14/2018 Chief Complaint Document Details Patient Name: [name], [name] Date of Service: 05/14/2018 2:30 PM Medical Record Number: [id number] Patient Account Number: [id number] Date of Birth/Sex: 08/11/1927 (85 y.o. F) Treating RN: Cornell Barman Primary Care Provider: Ria Bush Other Clinician: Referring Provider: Ria Bush Treating Provider/Extender: Melburn Hake, HOYT Weeks in Treatment: 7 Information Obtained from: Patient Chief Complaint RLE wounds Electronic Signature(s) Signed: 05/15/2018 5:08:21 PM By: Worthy Keeler PA-C Entered By: Worthy Keeler on 05/14/2018 15:02:44 Iwata, [name] ([id number]) -------------------------------------------------------------------------------- Debridement Details Patient Name: [name], [name] Date of Service: 05/14/2018 2:30 PM Medical Record Number: [id number] Patient Account Number: [id number] Date of Birth/Sex: [birth year] (85 y.o. F) Treating RN: Cornell Barman Primary Care Provider: Ria Bush Other Clinician: Referring Provider: Ria Bush Treating Provider/Extender: Melburn Hake, HOYT Weeks in Treatment: 7 Debridement Performed for Wound #8 Right,Lateral Lower Leg Assessment: Performed By: Physician STONE III, HOYT E., PA-C Debridement Type: Debridement Severity of Tissue Pre Fat layer exposed Debridement: Level of Consciousness (Pre- Awake and Alert procedure): Pre-procedure Verification/Time Yes - 15:30 Out Taken: Start Time: 15:31 Pain Control: Lidocaine Total Area Debrided (L x W): 3.9 (cm) x 1.7 (cm) = 6.63 (cm) Tissue and other material Viable, Non-Viable, Slough, Subcutaneous, Slough debrided: Level: Skin/Subcutaneous Tissue Debridement Description: Excisional Instrument: Curette Bleeding: Moderate Hemostasis Achieved: Pressure End Time: 15:36 Response to Treatment: Procedure was tolerated well Level of Consciousness Awake and  Alert (Post-procedure): Post Debridement Measurements of Total Wound Length: (cm) 3.9 Width: (cm) 1.7 Depth: (cm) 0.2 Volume: (cm) 1.041 Character of Wound/Ulcer Post Debridement: Requires Further Debridement Severity of Tissue Post Debridement: Fat layer exposed Post Procedure Diagnosis Same as Pre-procedure Electronic Signature(s) Signed: 05/15/2018 11:53:49 AM By: Gretta Cool, BSN, RN, CWS, Kim RN, BSN Signed: 05/15/2018 5:08:21 PM By: Worthy Keeler PA-C Entered By: Gretta Cool, BSN, RN, CWS, Kim on 05/14/2018 15:36:46 [name], [name] ([id number]) -------------------------------------------------------------------------------- HPI Details Patient Name: [name], [name] Date of Service: 05/14/2018 2:30 PM Medical Record Number: [id number] Patient Account Number: [id number] Date of Birth/Sex: 06-12-1927 (85 y.o. F) Treating RN: Cornell Barman Primary Care Provider: Ria Bush Other Clinician: Referring Provider: Ria Bush Treating Provider/Extender: Melburn Hake, HOYT Weeks in Treatment: 7 History of Present Illness HPI Description: Readmission: 03/23/18 on evaluation today patient presents for readmission entire clinic concerning issues that she has been having for the past couple of weeks in regard to her right lower extremity. The medial injury actually occurred when her dog tried to take a blanket from her and subsequently scratched her with his toenail. Subsequently the lateral injury was a wound where she dropped a knife in her kitchen and hit the side of her leg. With that being said both have been open for some time and home health nurse that has been coming out marked for her where the redness was several days ago. Nonetheless this has spread will be on the margin of what was marked. She was placed on Keflex which was started two days ago but at this point it does not seem to have helped with any improvement in the overall appearance of the wounds. No fevers, chills,  nausea, or vomiting noted at this time. 04/02/18 Seen today for follow up and management of right lower extremity wounds. Right lateral wound with adherent slough. She is complaining of pain to touch which limited exam. Recently treated in the ED for cellulitis and wound culture positive for MRSA. Completed a dose of Vancomycin and transitioned to doxycycline by mouth.  Erythema remains present with improvement since last visit. Right medial wound with scab present. No fevers, chills, nausea, or vomiting noted at this time. 04/10/18 on evaluation today patient appears to be doing a Welker better in regard to her right lateral lower [name]. The slough does seem to be coming off although it is slow from the wound bed. Fortunately there does not appear to be any evidence of infection at this time which is good news. With that being said I do believe that she is going to require longer treatment with the Santyl simply due to the fact that I'm not able to really debride the wound due to the pain that she is experiencing. 04/16/18 upon evaluation today patient's wound bed currently on the lateral lower extremity on the right appears to be doing a Gilbo better at this point. We are utilizing sensible currently. Overall I'm very pleased with how things are progressing although there still is some work to do before this will be completely healed. No fevers, chills, nausea, or vomiting noted at this time. 04/23/18 on evaluation today patient appears to be doing about the same at this point maybe just a [place] bit smaller than last week's evaluation. She continues to have some slough buildup on the surface of the wound and has very [place] granulation unfortunately. This seems to be very slow moving I'm getting somewhat concerned about the possibility of her having decreased blood flow which is leading to her subsequently not being able to heal this area as quickly as she shut otherwise. She is currently  utilizing Santyl. 05/01/18 on evaluation today patient appears to be doing somewhat poorly in regard to her lower extremity ulcer. At this point she's been tolerating the dressing changes to some degree without complication. With that being said she does note of the past several days that she has been having more pain unfortunately. On inspection it appears that she may have signs of infection which would account for the pain. 05/07/18 on evaluation today patient's wound actually appears to be doing better she's not having as much erythema in the right lower extremity as compared to last week. Fortunately there's no signs of infection this is good news as well. Overall very pleased with the progress that she has made up to this point. 05/14/18 on evaluation today patient actually appears to be doing rather well in regard to her right lower extremity ulcer. She does not have erythema that was characterizing the cellulitis prior to initiation of antibiotic therapy. With that being said her wound still appears to be somewhat slow in healing I believe this may be arterial in nature that is my concern at least. With that being said I do want her to see the vascular specialist as soon as possible we been trying to get this set up I'm not sure if she's not answering the phone or what exactly is going on here. AKSHARA, BLUMENTHAL ([id number]) Electronic Signature(s) Signed: 05/15/2018 5:08:21 PM By: Worthy Keeler PA-C Entered By: Worthy Keeler on 05/15/2018 17:06:20 [name], [name] ([id number]) -------------------------------------------------------------------------------- Physical Exam Details Patient Name: [name], [name] Date of Service: 05/14/2018 2:30 PM Medical Record Number: [id number] Patient Account Number: [id number] Date of Birth/Sex: [birth year] (85 y.o. F) Treating RN: Cornell Barman Primary Care Provider: Ria Bush Other Clinician: Referring Provider: Ria Bush Treating  Provider/Extender: STONE III, HOYT Weeks in Treatment: 7 Constitutional Well-nourished and well-hydrated in no acute distress. Respiratory normal breathing without difficulty. Psychiatric this patient is able  to make decisions and demonstrates good insight into disease process. Alert and Oriented x 3. pleasant and cooperative. Notes Patient's wound bed currently shows signs of excellent having some Slough on the surface of the wound which required sharp debridement today. I did perform this lightly again due to the arterial concerns not wanting to make the wound significantly larger just clearing away some of the surface slough so that we can attempt a different dressing, collagen, to see if we get this to heal faster. She tolerated this with minimal discomfort. Electronic Signature(s) Signed: 05/15/2018 5:08:21 PM By: Worthy Keeler PA-C Entered By: Worthy Keeler on 05/15/2018 17:06:45 Mendibles, [name] ([id number]) -------------------------------------------------------------------------------- Physician Orders Details Patient Name: [name], [name] Date of Service: 05/14/2018 2:30 PM Medical Record Number: [id number] Patient Account Number: [id number] Date of Birth/Sex: 1927/07/15 (85 y.o. F) Treating RN: Cornell Barman Primary Care Provider: Ria Bush Other Clinician: Referring Provider: Ria Bush Treating Provider/Extender: Melburn Hake, HOYT Weeks in Treatment: 7 Verbal / Phone Orders: No Diagnosis Coding ICD-10 Coding Code Description I87.331 Chronic venous hypertension (idiopathic) with ulcer and inflammation of right lower extremity E11.622 Type 2 diabetes mellitus with other skin ulcer L97.812 Non-pressure chronic ulcer of other part of right lower leg with fat layer exposed L03.115 Cellulitis of right lower limb Wound Cleansing Wound #8 Right,Lateral Lower Leg o Clean wound with Normal Saline. Anesthetic (add to Medication List) Wound #8 Right,Lateral  Lower Leg o Topical Lidocaine 4% cream applied to wound bed prior to debridement (In Clinic Only). Primary Wound Dressing Wound #8 Right,Lateral Lower Leg o Silver Collagen Secondary Dressing Wound #8 Right,Lateral Lower Leg o ABD and Kerlix/Conform Dressing Change Frequency Wound #8 Right,Lateral Lower Leg o Change dressing every week Follow-up Appointments Wound #8 Right,Lateral Lower Leg o Return Appointment in 1 week. Edema Control Wound #8 Right,Lateral Lower Leg o Elevate legs to the level of the heart and pump ankles as often as possible o Other: - [place] #8 Right,Lateral Lower Leg o [place] Visits - HOLD patient will appeal discharge per Advanced Letter to patient o Home Health Nurse may visit PRN to address patientos wound care needs. LATASIA, SILBERSTEIN ([id number]) o FACE TO FACE ENCOUNTER: MEDICARE and MEDICAID PATIENTS: I certify that this patient is under my care and that I had a face-to-face encounter that meets the physician face-to-face encounter requirements with this patient on this date. The encounter with the patient was in whole or in part for the following MEDICAL CONDITION: (primary reason for [place]) MEDICAL NECESSITY: I certify, that based on my findings, NURSING services are a medically necessary home health service. HOME BOUND STATUS: I certify that my clinical findings support that this patient is homebound (i.e., Due to illness or injury, pt requires aid of supportive devices such as crutches, cane, wheelchairs, walkers, the use of special transportation or the assistance of another person to leave their place of residence. There is a normal inability to leave the home and doing so requires considerable and taxing effort. Other absences are for medical reasons / religious services and are infrequent or of short duration when for other reasons). o If current dressing causes regression in wound  condition, may D/C ordered dressing product/s and apply Normal Saline Moist Dressing daily until next [place] / Other MD appointment. [place] of regression in wound condition at [phone]. o Please direct any NON-WOUND related issues/requests for orders to patient's Primary Care Physician Medications-please add to  medication list. Wound #8 Right,Lateral Lower Leg o P.O. Antibiotics - Complete antibiotics Electronic Signature(s) Signed: 05/15/2018 11:53:49 AM By: Gretta Cool, BSN, RN, CWS, Kim RN, BSN Signed: 05/15/2018 5:08:21 PM By: Worthy Keeler PA-C Entered By: Gretta Cool, BSN, RN, CWS, Kim on 05/14/2018 16:03:07 Westerman, [name] ([id number]) -------------------------------------------------------------------------------- Problem List Details Patient Name: [name], [name] Date of Service: 05/14/2018 2:30 PM Medical Record Number: [id number] Patient Account Number: [id number] Date of Birth/Sex: 1927-09-23 (85 y.o. F) Treating RN: Cornell Barman Primary Care Provider: Ria Bush Other Clinician: Referring Provider: Ria Bush Treating Provider/Extender: Melburn Hake, HOYT Weeks in Treatment: 7 Active Problems ICD-10 Evaluated Encounter Code Description Active Date Today Diagnosis I87.331 Chronic venous hypertension (idiopathic) with ulcer and 03/23/2018 No Yes inflammation of right lower extremity E11.622 Type 2 diabetes mellitus with other skin ulcer 03/23/2018 No Yes L97.812 Non-pressure chronic ulcer of other part of right lower leg 03/23/2018 No Yes with fat layer exposed L03.115 Cellulitis of right lower limb 03/23/2018 No Yes Inactive Problems Resolved Problems Electronic Signature(s) Signed: 05/15/2018 5:08:21 PM By: Worthy Keeler PA-C Entered By: Worthy Keeler on 05/14/2018 15:02:34 [name], [name] ([id number]) -------------------------------------------------------------------------------- Progress Note Details Patient Name:  [name], [name] Date of Service: 05/14/2018 2:30 PM Medical Record Number: [id number] Patient Account Number: [id number] Date of Birth/Sex: 24-Nov-1927 (85 y.o. F) Treating RN: Cornell Barman Primary Care Provider: Ria Bush Other Clinician: Referring Provider: Ria Bush Treating Provider/Extender: Melburn Hake, HOYT Weeks in Treatment: 7 Subjective Chief Complaint Information obtained from Patient RLE wounds History of Present Illness (HPI) Readmission: 03/23/18 on evaluation today patient presents for readmission entire clinic concerning issues that she has been having for the past couple of weeks in regard to her right lower extremity. The medial injury actually occurred when her dog tried to take a blanket from her and subsequently scratched her with his toenail. Subsequently the lateral injury was a wound where she dropped a knife in her kitchen and hit the side of her leg. With that being said both have been open for some time and home health nurse that has been coming out marked for her where the redness was several days ago. Nonetheless this has spread will be on the margin of what was marked. She was placed on Keflex which was started two days ago but at this point it does not seem to have helped with any improvement in the overall appearance of the wounds. No fevers, chills, nausea, or vomiting noted at this time. 04/02/18 Seen today for follow up and management of right lower extremity wounds. Right lateral wound with adherent slough. She is complaining of pain to touch which limited exam. Recently treated in the ED for cellulitis and wound culture positive for MRSA. Completed a dose of Vancomycin and transitioned to doxycycline by mouth. Erythema remains present with improvement since last visit. Right medial wound with scab present. No fevers, chills, nausea, or vomiting noted at this time. 04/10/18 on evaluation today patient appears to be doing a [place]azu[place] better in  regard to her right lateral lower [name]. The slough does seem to be coming off although it is slow from the wound bed. Fortunately there does not appear to be any evidence of infection at this time which is good news. With that being said I do believe that she is going to require longer treatment with the Santyl simply due to the fact that I'm not able to really debride the wound due to the pain that she is experiencing. 04/16/18  upon evaluation today patient's wound bed currently on the lateral lower extremity on the right appears to be doing a Gair better at this point. We are utilizing sensible currently. Overall I'm very pleased with how things are progressing although there still is some work to do before this will be completely healed. No fevers, chills, nausea, or vomiting noted at this time. 04/23/18 on evaluation today patient appears to be doing about the same at this point maybe just a [place] bit smaller than last week's evaluation. She continues to have some slough buildup on the surface of the wound and has very Dyk granulation unfortunately. This seems to be very slow moving I'm getting somewhat concerned about the possibility of her having decreased blood flow which is leading to her subsequently not being able to heal this area as quickly as she shut otherwise. She is currently utilizing Santyl. 05/01/18 on evaluation today patient appears to be doing somewhat poorly in regard to her lower extremity ulcer. At this point she's been tolerating the dressing changes to some degree without complication. With that being said she does note of the past several days that she has been having more pain unfortunately. On inspection it appears that she may have signs of infection which would account for the pain. 05/07/18 on evaluation today patient's wound actually appears to be doing better she's not having as much erythema in the right lower extremity as compared to last week.  Fortunately there's no signs of infection this is good news as well. Overall very pleased with the progress that she has made up to this point. [name], [name] ([id number]) 05/14/18 on evaluation today patient actually appears to be doing rather well in regard to her right lower extremity ulcer. She does not have erythema that was characterizing the cellulitis prior to initiation of antibiotic therapy. With that being said her wound still appears to be somewhat slow in healing I believe this may be arterial in nature that is my concern at least. With that being said I do want her to see the vascular specialist as soon as possible we been trying to get this set up I'm not sure if she's not answering the phone or what exactly is going on here. Patient History Information obtained from Patient. Family History Diabetes - Father,Siblings,Child, Hypertension - Child, Lung Disease - Child, Stroke - Mother, No family history of Cancer, Heart Disease, Kidney Disease, Seizures, Thyroid Problems. Social History Former smoker, Marital Status - Single, Alcohol Use - Never, Drug Use - No History, Caffeine Use - Daily. Medical History Eyes Patient has history of Cataracts - removed Denies history of Glaucoma, Optic Neuritis Hematologic/Lymphatic Patient has history of Anemia Denies history of Hemophilia, Human Immunodeficiency Virus, Lymphedema, Sickle Cell Disease Respiratory Denies history of Aspiration, Asthma, Chronic Obstructive Pulmonary Disease (COPD), Pneumothorax, Sleep Apnea, Tuberculosis Cardiovascular Patient has history of Arrhythmia, Coronary Artery Disease, Hypertension, Myocardial Infarction Denies history of Angina, Congestive Heart Failure, Deep Vein Thrombosis, Hypotension, Peripheral Arterial Disease, Peripheral Venous Disease, Phlebitis Endocrine Patient has history of Type II Diabetes Denies history of Type I Diabetes Genitourinary Denies history of End Stage Renal  Disease Immunological Denies history of Lupus Erythematosus, Raynaud s, Scleroderma Integumentary (Skin) Denies history of History of Burn, History of pressure wounds Musculoskeletal Patient has history of Osteoarthritis Denies history of Gout, Rheumatoid Arthritis, Osteomyelitis Neurologic Denies history of Dementia, Neuropathy, Quadriplegia, Paraplegia, Seizure Disorder Oncologic Denies history of Received Chemotherapy, Received Radiation Medical And Surgical History Notes Constitutional Symptoms (General Health)  High Blood Pressure, open heart surgery, gall stone, hysterectomy; Fluid pill, Anxiety Review of Systems (ROS) Constitutional Symptoms (General Health) Denies complaints or symptoms of Fatigue, Fever, Chills. Respiratory The patient has no complaints or symptoms. Cardiovascular The patient has no complaints or symptoms. [name], [name] ([id number]) Psychiatric The patient has no complaints or symptoms. Objective Constitutional Well-nourished and well-hydrated in no acute distress. Vitals Time Taken: 3:05 PM, Height: 58 in, Source: Stated, Weight: 140 lbs, Source: Stated, BMI: 29.3, Temperature: 98.1 F, Pulse: 64 bpm, Respiratory Rate: 16 breaths/min, Blood Pressure: 135/74 mmHg. Respiratory normal breathing without difficulty. Psychiatric this patient is able to make decisions and demonstrates good insight into disease process. Alert and Oriented x 3. pleasant and cooperative. General Notes: Patient's wound bed currently shows signs of excellent having some Slough on the surface of the wound which required sharp debridement today. I did perform this lightly again due to the arterial concerns not wanting to make the wound significantly larger just clearing away some of the surface slough so that we can attempt a different dressing, collagen, to see if we get this to heal faster. She tolerated this with minimal discomfort. Integumentary (Hair, Skin) Wound #8  status is Open. Original cause of wound was Trauma. The wound is located on the Right,Lateral Lower Leg. The wound measures 3.9cm length x 1.7cm width x 0.2cm depth; 5.207cm^2 area and 1.041cm^3 volume. There is Fat Layer (Subcutaneous Tissue) Exposed exposed. There is no tunneling or undermining noted. There is a medium amount of purulent drainage noted. The wound margin is flat and intact. There is small (1-33%) granulation within the wound bed. There is a large (67-100%) amount of necrotic tissue within the wound bed including Adherent Slough. The periwound skin appearance exhibited: Excoriation, Dry/Scaly, Erythema. The periwound skin appearance did not exhibit: Callus, Crepitus, Induration, Rash, Scarring, Maceration, Atrophie Blanche, Cyanosis, Ecchymosis, Hemosiderin Staining, Mottled, Pallor, Rubor. The surrounding wound skin color is noted with erythema which is circumferential. Periwound temperature was noted as No Abnormality. The periwound has tenderness on palpation. Assessment Active Problems ICD-10 Chronic venous hypertension (idiopathic) with ulcer and inflammation of right lower extremity Type 2 diabetes mellitus with other skin ulcer Non-pressure chronic ulcer of other part of right lower leg with fat layer exposed Cellulitis of right lower limb [name], [name] ([id number]) Procedures Wound #8 Pre-procedure diagnosis of Wound #8 is a Diabetic Wound/Ulcer of the Lower Extremity located on the Right,Lateral Lower Leg .Severity of Tissue Pre Debridement is: Fat layer exposed. There was a Excisional Skin/Subcutaneous Tissue Debridement with a total area of 6.63 sq cm performed by STONE III, HOYT E., PA-C. With the following instrument(s): Curette to remove Viable and Non-Viable tissue/material. Material removed includes Subcutaneous Tissue and Slough and after achieving pain control using Lidocaine. No specimens were taken. A time out was conducted at 15:30, prior to the  start of the procedure. A Moderate amount of bleeding was controlled with Pressure. The procedure was tolerated well. Post Debridement Measurements: 3.9cm length x 1.7cm width x 0.2cm depth; 1.041cm^3 volume. Character of Wound/Ulcer Post Debridement requires further debridement. Severity of Tissue Post Debridement is: Fat layer exposed. Post procedure Diagnosis Wound #8: Same as Pre-Procedure Plan Wound Cleansing: Wound #8 Right,Lateral Lower Leg: Clean wound with Normal Saline. Anesthetic (add to Medication List): Wound #8 Right,Lateral Lower Leg: Topical Lidocaine 4% cream applied to wound bed prior to debridement (In Clinic Only). Primary Wound Dressing: Wound #8 Right,Lateral Lower Leg: Silver Collagen Secondary Dressing: Wound #8 Right,Lateral Lower Leg:  ABD and Kerlix/Conform Dressing Change Frequency: Wound #8 Right,Lateral Lower Leg: Change dressing every week Follow-up Appointments: Wound #8 Right,Lateral Lower Leg: Return Appointment in 1 week. Edema Control: Wound #8 Right,Lateral Lower Leg: Elevate legs to the level of the heart and pump ankles as often as possible Other: - tubigrip Home Health: Wound #8 Right,Lateral Lower Leg: Continue Home Health Visits - HOLD patient will appeal discharge per Advanced Letter to patient Home Health Nurse may visit PRN to address patient s wound care needs. FACE TO FACE ENCOUNTER: MEDICARE and MEDICAID PATIENTS: I certify that this patient is under my care and that I had a face-to-face encounter that meets the physician face-to-face encounter requirements with this patient on this date. The encounter with the patient was in whole or in part for the following MEDICAL CONDITION: (primary reason for [place]) MEDICAL NECESSITY: I certify, that based on my findings, NURSING services are a medically necessary home health service. HOME BOUND STATUS: I certify that my clinical findings support that this patient is homebound (i.e.,  Due to illness or injury, pt requires aid of supportive devices such as crutches, cane, wheelchairs, walkers, the use of special transportation or the assistance of another person to leave their place of residence. There is a normal inability to leave the home and doing so requires considerable and taxing effort. Other absences are for medical reasons / religious services and [place], [place]. ([id number]) are infrequent or of short duration when for other reasons). If current dressing causes regression in wound condition, may D/C ordered dressing product/s and apply Normal Saline Moist Dressing daily until next [place] / Other MD appointment. [place] of regression in wound condition at [phone]. Please direct any NON-WOUND related issues/requests for orders to patient's Primary Care Physician Medications-please add to medication list.: Wound #8 Right,Lateral Lower Leg: P.O. Antibiotics - Complete antibiotics My suggestion at this point is gonna be that we initiate the above wound care measures she's in agreement the plan. We're subsequently see were things stand at follow-up in one weeks time. We will also work on getting the vascular appointment set for her. Please see above for specific wound care orders. We will see patient for re-evaluation in 1 week(s) here in the clinic. If anything worsens or changes patient will contact our office for additional recommendations. Electronic Signature(s) Signed: 05/15/2018 5:08:21 PM By: Worthy Keeler PA-C Entered By: Worthy Keeler on 05/15/2018 17:06:54 Hershey, [name] ([id number]) -------------------------------------------------------------------------------- ROS/PFSH Details Patient Name: Whitworth, Aamya C. Date of Service: 05/14/2018 2:30 PM Medical Record Number: [id number] Patient Account Number: [id number] Date of Birth/Sex: 01-May-1927 (85 y.o. F) Treating RN: Cornell Barman Primary Care Provider:  Ria Bush Other Clinician: Referring Provider: Ria Bush Treating Provider/Extender: Melburn Hake, HOYT Weeks in Treatment: 7 Information Obtained From Patient Wound History Constitutional Symptoms (General Health) Complaints and Symptoms: Negative for: Fatigue; Fever; Chills Medical History: Past Medical History Notes: High Blood Pressure, open heart surgery, gall stone, hysterectomy; Fluid pill, Anxiety Eyes Medical History: Positive for: Cataracts - removed Negative for: Glaucoma; Optic Neuritis Hematologic/Lymphatic Medical History: Positive for: Anemia Negative for: Hemophilia; Human Immunodeficiency Virus; Lymphedema; Sickle Cell Disease Respiratory Complaints and Symptoms: No Complaints or Symptoms Medical History: Negative for: Aspiration; Asthma; Chronic Obstructive Pulmonary Disease (COPD); Pneumothorax; Sleep Apnea; Tuberculosis Cardiovascular Complaints and Symptoms: No Complaints or Symptoms Medical History: Positive for: Arrhythmia; Coronary Artery Disease; Hypertension; Myocardial Infarction Negative for: Angina; Congestive Heart Failure; Deep Vein Thrombosis; Hypotension; Peripheral Arterial Disease; Peripheral  Venous Disease; Phlebitis Endocrine Medical History: Positive for: Type II Diabetes Negative for: Type I Diabetes [place], Marijah C. ([id number]) Time with diabetes: no idea Treated with: Oral agents Blood sugar tested every day: No Genitourinary Medical History: Negative for: End Stage Renal Disease Immunological Medical History: Negative for: Lupus Erythematosus; Raynaudos; Scleroderma Integumentary (Skin) Medical History: Negative for: History of Burn; History of pressure wounds Musculoskeletal Medical History: Positive for: Osteoarthritis Negative for: Gout; Rheumatoid Arthritis; Osteomyelitis Neurologic Medical History: Negative for: Dementia; Neuropathy; Quadriplegia; Paraplegia; Seizure Disorder Oncologic Medical  History: Negative for: Received Chemotherapy; Received Radiation Psychiatric Complaints and Symptoms: No Complaints or Symptoms HBO Extended History Items Eyes: Cataracts Immunizations Pneumococcal Vaccine: Received Pneumococcal Vaccination: Yes Implantable Devices Family and Social History Cancer: No; Diabetes: Yes - Father,Siblings,Child; Heart Disease: No; Hypertension: Yes - Child; Kidney Disease: No; Lung Disease: Yes - Child; Seizures: No; Stroke: Yes - Mother; Thyroid Problems: No; Former smoker; Marital Status - Single; Alcohol Use: Never; Drug Use: No History; Caffeine Use: Daily; Financial Concerns: No; Food, Clothing or Shelter Needs: No; Support System Lacking: No; Transportation Concerns: No; Advanced Directives: Yes (Not Provided); Patient does not want information on Advanced Directives; Living Will: Yes (Not Provided) Physician Affirmation I have reviewed and agree with the above information. ELZABETH, MCQUERRY ([id number]) Electronic Signature(s) Signed: 05/15/2018 5:08:21 PM By: Worthy Keeler PA-C Signed: 05/17/2018 8:43:41 AM By: Gretta Cool, BSN, RN, CWS, Kim RN, BSN Entered By: Worthy Keeler on 05/15/2018 17:06:32 Finken, [name] ([id number]) -------------------------------------------------------------------------------- SuperBill Details Patient Name: Stefanko, Heer C. Date of Service: 05/14/2018 Medical Record Number: [id number] Patient Account Number: [id number] Date of Birth/Sex: 30-Sep-1927 (85 y.o. F) Treating RN: Cornell Barman Primary Care Provider: Ria Bush Other Clinician: Referring Provider: Ria Bush Treating Provider/Extender: Melburn Hake, HOYT Weeks in Treatment: 7 Diagnosis Coding ICD-10 Codes Code Description I87.331 Chronic venous hypertension (idiopathic) with ulcer and inflammation of right lower extremity E11.622 Type 2 diabetes mellitus with other skin ulcer L97.812 Non-pressure chronic ulcer of other part of right lower leg  with fat layer exposed L03.115 Cellulitis of right lower limb Facility Procedures CPT4 Code Description: 37106269 11042 - DEB SUBQ TISSUE 20 SQ CM/< ICD-10 Diagnosis Description S85.462 Non-pressure chronic ulcer of other part of right lower leg wi Modifier: th fat layer expo Quantity: 1 sed Physician Procedures CPT4 Code Description: 7035009 11042 - WC PHYS SUBQ TISS 20 SQ CM ICD-10 Diagnosis Description F81.829 Non-pressure chronic ulcer of other part of right lower leg wi Modifier: th fat layer expo Quantity: 1 sed Electronic Signature(s) Signed: 05/15/2018 5:08:21 PM By: Worthy Keeler PA-C Previous Signature: 05/15/2018 3:31:47 PM Version By: [place] Entered By: Worthy Keeler on 05/15/2018 17:07:08

## 2018-05-21 ENCOUNTER — Encounter: Payer: Medicare Other | Admitting: Physician Assistant

## 2018-05-21 DIAGNOSIS — I252 Old myocardial infarction: Secondary | ICD-10-CM | POA: Diagnosis not present

## 2018-05-21 DIAGNOSIS — E11622 Type 2 diabetes mellitus with other skin ulcer: Secondary | ICD-10-CM | POA: Diagnosis not present

## 2018-05-21 DIAGNOSIS — I87331 Chronic venous hypertension (idiopathic) with ulcer and inflammation of right lower extremity: Secondary | ICD-10-CM | POA: Diagnosis not present

## 2018-05-21 DIAGNOSIS — L97812 Non-pressure chronic ulcer of other part of right lower leg with fat layer exposed: Secondary | ICD-10-CM | POA: Diagnosis not present

## 2018-05-21 DIAGNOSIS — L03115 Cellulitis of right lower limb: Secondary | ICD-10-CM | POA: Diagnosis not present

## 2018-05-21 DIAGNOSIS — Z87891 Personal history of nicotine dependence: Secondary | ICD-10-CM | POA: Diagnosis not present

## 2018-05-22 ENCOUNTER — Telehealth: Payer: Self-pay | Admitting: Family Medicine

## 2018-05-22 DIAGNOSIS — L539 Erythematous condition, unspecified: Secondary | ICD-10-CM

## 2018-05-22 NOTE — Telephone Encounter (Signed)
Pt still has the red spot under her arm and she has no more cream to use. Pt want to know what does she need to do. Please call pt

## 2018-05-22 NOTE — Telephone Encounter (Signed)
Dr. Glori Bickers, will you address in Dr. Synthia Innocent absence?  In OV note on 04/27/18, Dr. Darnell Level advised pt he would refer her to dermatology if she did not improve.  Would you be able to order the referral?

## 2018-05-23 NOTE — Telephone Encounter (Signed)
Appt made with Dr Laurence Ferrari for tomorrow at 3:15pm patient notified.

## 2018-05-23 NOTE — Telephone Encounter (Signed)
Noted! Thank you

## 2018-05-23 NOTE — Progress Notes (Signed)
CAMBREE, [name] ([id number]) Visit Report for 05/21/2018 Chief Complaint Document Details Patient Name: [name], [name] Date of Service: 05/21/2018 3:15 PM Medical Record Number: [id number] Patient Account Number: [id number] Date of Birth/Sex: [birth year] (85 y.o. F) Treating RN: Harold Barban Primary Care Provider: Ria Bush Other Clinician: Referring Provider: Ria Bush Treating Provider/Extender: Melburn Hake, Joshaua Epple Weeks in Treatment: 8 Information Obtained from: Patient Chief Complaint RLE wounds Electronic Signature(s) Signed: 05/22/2018 12:02:18 AM By: Worthy Keeler PA-[name] Entered By: Worthy Keeler on 05/21/2018 16:12:09 [name], [name] ([id number]) -------------------------------------------------------------------------------- HPI Details Patient Name: [name], [name] Date of Service: 05/21/2018 3:15 PM Medical Record Number: [id number] Patient Account Number: [id number] Date of Birth/Sex: 10-23-1927 (85 y.o. F) Treating RN: Harold Barban Primary Care Provider: Ria Bush Other Clinician: Referring Provider: Ria Bush Treating Provider/Extender: Melburn Hake, Manie Bealer Weeks in Treatment: 8 History of Present Illness HPI Description: Readmission: 03/23/18 on evaluation today patient presents for readmission entire clinic concerning issues that she has been having for the past couple of weeks in regard to her right lower extremity. The medial injury actually occurred when her dog tried to take a blanket from her and subsequently scratched her with his toenail. Subsequently the lateral injury was a wound where she dropped a knife in her kitchen and hit the side of her leg. With that being said both have been open for some time and home health nurse that has been coming out marked for her where the redness was several days ago. Nonetheless this has spread will be on the margin of what was marked. She was placed on Keflex which was started two days  ago but at this point it does not seem to have helped with any improvement in the overall appearance of the wounds. No fevers, chills, nausea, or vomiting noted at this time. 04/02/18 Seen today for follow up and management of right lower extremity wounds. Right lateral wound with adherent slough. She is complaining of pain to touch which limited exam. Recently treated in the ED for cellulitis and wound culture positive for MRSA. Completed a dose of Vancomycin and transitioned to doxycycline by mouth. Erythema remains present with improvement since last visit. Right medial wound with scab present. No fevers, chills, nausea, or vomiting noted at this time. 04/10/18 on evaluation today patient appears to be doing a [place] better in regard to her right lateral lower [name]. The slough does seem to be coming off although it is slow from the wound bed. Fortunately there does not appear to be any evidence of infection at this time which is good news. With that being said I do believe that she is going to require longer treatment with the Santyl simply due to the fact that I'm not able to really debride the wound due to the pain that she is experiencing. 04/16/18 upon evaluation today patient's wound bed currently on the lateral lower extremity on the right appears to be doing a Sharpley better at this point. We are utilizing sensible currently. Overall I'm very pleased with how things are progressing although there still is some work to do before this will be completely healed. No fevers, chills, nausea, or vomiting noted at this time. 04/23/18 on evaluation today patient appears to be doing about the same at this point maybe just a Mazzola bit smaller than last week's evaluation. She continues to have some slough buildup on the surface of the wound and has very Hudler granulation unfortunately. This seems to be very slow moving  I'm getting somewhat concerned about the possibility of her  having decreased blood flow which is leading to her subsequently not being able to heal this area as quickly as she shut otherwise. She is currently utilizing Santyl. 05/01/18 on evaluation today patient appears to be doing somewhat poorly in regard to her lower extremity ulcer. At this point she's been tolerating the dressing changes to some degree without complication. With that being said she does note of the past several days that she has been having more pain unfortunately. On inspection it appears that she may have signs of infection which would account for the pain. 05/07/18 on evaluation today patient's wound actually appears to be doing better she's not having as much erythema in the right lower extremity as compared to last week. Fortunately there's no signs of infection this is good news as well. Overall very pleased with the progress that she has made up to this point. 05/14/18 on evaluation today patient actually appears to be doing rather well in regard to her right lower extremity ulcer. She does not have erythema that was characterizing the cellulitis prior to initiation of antibiotic therapy. With that being said her wound still appears to be somewhat slow in healing I believe this may be arterial in nature that is my concern at least. With that being said I do want her to see the vascular specialist as soon as possible we been trying to get this set up I'm not sure if she's not answering the phone or what exactly is going on here. [name], [name] ([id number]) 05/21/18 on evaluation today patient's wound actually appears to be doing a [place] better possibly some additional granulation compared to my last evaluation. With that being said she still continues to have a lot of pain especially when her legs are elevated such as sitting in the chair here in the office. There does not appear to be any signs of infection which is good news. Electronic Signature(s) Signed: 05/22/2018 12:02:18  AM By: Worthy Keeler PA-[name] Entered By: Worthy Keeler on 05/21/2018 17:37:16 [name], [name] ([id number]) -------------------------------------------------------------------------------- Physical Exam Details Patient Name: [name], [name] Date of Service: 05/21/2018 3:15 PM Medical Record Number: [id number] Patient Account Number: [id number] Date of Birth/Sex: [birth year] (85 y.o. F) Treating RN: Harold Barban Primary Care Provider: Ria Bush Other Clinician: Referring Provider: Ria Bush Treating Provider/Extender: STONE III, Artia Singley Weeks in Treatment: 8 Constitutional Well-nourished and well-hydrated in no acute distress. Respiratory normal breathing without difficulty. clear to auscultation bilaterally. Cardiovascular regular rate and rhythm with normal S1, S2. Psychiatric this patient is able to make decisions and demonstrates good insight into disease process. Alert and Oriented x 3. pleasant and cooperative. Notes Patient's wound bed currently shows signs of good granulation at this time fortunately there does not appear to be any evidence of overt infection which is good news no sharp debridement was performed today. Overall I do believe that the alginate dressing is still good for her and seeming to help control the drainage there's not any maceration noted at this point which is good news. Overall I'm happy. Electronic Signature(s) Signed: 05/22/2018 12:02:18 AM By: Worthy Keeler PA-[name] Entered By: Worthy Keeler on 05/21/2018 17:37:51 [name], [name] ([id number]) -------------------------------------------------------------------------------- Physician Orders Details Patient Name: [name], [name] Date of Service: 05/21/2018 3:15 PM Medical Record Number: [id number] Patient Account Number: [id number] Date of Birth/Sex: 1927-10-08 (85 y.o. F) Treating RN: Harold Barban Primary Care Provider: Ria Bush Other Clinician: Referring Provider:  Ria Bush Treating Provider/Extender: STONE III, Latrece Nitta Weeks in Treatment: 8 Verbal / Phone Orders: No Diagnosis Coding ICD-10 Coding Code Description I87.331 Chronic venous hypertension (idiopathic) with ulcer and inflammation of right lower extremity E11.622 Type 2 diabetes mellitus with other skin ulcer L97.812 Non-pressure chronic ulcer of other part of right lower leg with fat layer exposed L03.115 Cellulitis of right lower limb Wound Cleansing Wound #8 Right,Lateral Lower Leg o Clean wound with Normal Saline. Anesthetic (add to Medication List) Wound #8 Right,Lateral Lower Leg o Topical Lidocaine 4% cream applied to wound bed prior to debridement (In Clinic Only). Primary Wound Dressing Wound #8 Right,Lateral Lower Leg o Silver Collagen Secondary Dressing Wound #8 Right,Lateral Lower Leg o ABD and Kerlix/Conform Dressing Change Frequency Wound #8 Right,Lateral Lower Leg o Change dressing every week Follow-up Appointments Wound #8 Right,Lateral Lower Leg o Return Appointment in 1 week. Edema Control Wound #8 Right,Lateral Lower Leg o Elevate legs to the level of the heart and pump ankles as often as possible o Other: - [place] #8 Right,Lateral Lower Leg o [place] Visits - HOLD patient will appeal discharge per Advanced Letter to patient o Home Health Nurse may visit PRN to address patientos wound care needs. [name], [name] ([id number]) o FACE TO FACE ENCOUNTER: MEDICARE and MEDICAID PATIENTS: I certify that this patient is under my care and that I had a face-to-face encounter that meets the physician face-to-face encounter requirements with this patient on this date. The encounter with the patient was in whole or in part for the following MEDICAL CONDITION: (primary reason for [place]) MEDICAL NECESSITY: I certify, that based on my findings, NURSING services are a medically necessary home health  service. HOME BOUND STATUS: I certify that my clinical findings support that this patient is homebound (i.e., Due to illness or injury, pt requires aid of supportive devices such as crutches, cane, wheelchairs, walkers, the use of special transportation or the assistance of another person to leave their place of residence. There is a normal inability to leave the home and doing so requires considerable and taxing effort. Other absences are for medical reasons / religious services and are infrequent or of short duration when for other reasons). o If current dressing causes regression in wound condition, may D/[name] ordered dressing product/s and apply Normal Saline Moist Dressing daily until next [place] / Other MD appointment. [place] of regression in wound condition at [phone]. o Please direct any NON-WOUND related issues/requests for orders to patient's Primary Care Physician Medications-please add to medication list. Wound #8 Right,Lateral Lower Leg o P.O. Antibiotics - Complete antibiotics Electronic Signature(s) Signed: 05/22/2018 12:02:18 AM By: Worthy Keeler PA-[name] Signed: 05/23/2018 8:37:22 AM By: Harold Barban Entered By: Harold Barban on 05/21/2018 16:25:17 [name], [name] ([id number]) -------------------------------------------------------------------------------- Problem List Details Patient Name: Moulton, Nilani [name]. Date of Service: 05/21/2018 3:15 PM Medical Record Number: [id number] Patient Account Number: [id number] Date of Birth/Sex: 04-15-1927 (85 y.o. F) Treating RN: Harold Barban Primary Care Provider: Ria Bush Other Clinician: Referring Provider: Ria Bush Treating Provider/Extender: Melburn Hake, Ainslie Mazurek Weeks in Treatment: 8 Active Problems ICD-10 Evaluated Encounter Code Description Active Date Today Diagnosis I87.331 Chronic venous hypertension (idiopathic) with ulcer and 03/23/2018 No Yes inflammation of  right lower extremity E11.622 Type 2 diabetes mellitus with other skin ulcer 03/23/2018 No Yes L97.812 Non-pressure chronic ulcer of other part of right lower leg 03/23/2018 No Yes with fat layer exposed L03.115 Cellulitis of right lower  limb 03/23/2018 No Yes Inactive Problems Resolved Problems Electronic Signature(s) Signed: 05/22/2018 12:02:18 AM By: Worthy Keeler PA-[name] Entered By: Worthy Keeler on 05/21/2018 16:12:05 [name], [name] ([id number]) -------------------------------------------------------------------------------- Progress Note Details Patient Name: [name], [name] Date of Service: 05/21/2018 3:15 PM Medical Record Number: [id number] Patient Account Number: [id number] Date of Birth/Sex: 09/02/1927 (85 y.o. F) Treating RN: Harold Barban Primary Care Provider: Ria Bush Other Clinician: Referring Provider: Ria Bush Treating Provider/Extender: Melburn Hake, Oliana Gowens Weeks in Treatment: 8 Subjective Chief Complaint Information obtained from Patient RLE wounds History of Present Illness (HPI) Readmission: 03/23/18 on evaluation today patient presents for readmission entire clinic concerning issues that she has been having for the past couple of weeks in regard to her right lower extremity. The medial injury actually occurred when her dog tried to take a blanket from her and subsequently scratched her with his toenail. Subsequently the lateral injury was a wound where she dropped a knife in her kitchen and hit the side of her leg. With that being said both have been open for some time and home health nurse that has been coming out marked for her where the redness was several days ago. Nonetheless this has spread will be on the margin of what was marked. She was placed on Keflex which was started two days ago but at this point it does not seem to have helped with any improvement in the overall appearance of the wounds. No fevers, chills, nausea, or vomiting  noted at this time. 04/02/18 Seen today for follow up and management of right lower extremity wounds. Right lateral wound with adherent slough. She is complaining of pain to touch which limited exam. Recently treated in the ED for cellulitis and wound culture positive for MRSA. Completed a dose of Vancomycin and transitioned to doxycycline by mouth. Erythema remains present with improvement since last visit. Right medial wound with scab present. No fevers, chills, nausea, or vomiting noted at this time. 04/10/18 on evaluation today patient appears to be doing a [place] better in regard to her right lateral lower [name]. The slough does seem to be coming off although it is slow from the wound bed. Fortunately there does not appear to be any evidence of infection at this time which is good news. With that being said I do believe that she is going to require longer treatment with the Santyl simply due to the fact that I'm not able to really debride the wound due to the pain that she is experiencing. 04/16/18 upon evaluation today patient's wound bed currently on the lateral lower extremity on the right appears to be doing a [place] better at this point. We are utilizing sensible currently. Overall I'm very pleased with how things are progressing although there still is some work to do before this will be completely healed. No fevers, chills, nausea, or vomiting noted at this time. 04/23/18 on evaluation today patient appears to be doing about the same at this point maybe just a Fiorini bit smaller than last week's evaluation. She continues to have some slough buildup on the surface of the wound and has very [place] granulation unfortunately. This seems to be very slow moving I'm getting somewhat concerned about the possibility of her having decreased blood flow which is leading to her subsequently not being able to heal this area as quickly as she shut otherwise. She is currently utilizing  Santyl. 05/01/18 on evaluation today patient appears to be doing somewhat poorly in regard to her  lower extremity ulcer. At this point she's been tolerating the dressing changes to some degree without complication. With that being said she does note of the past several days that she has been having more pain unfortunately. On inspection it appears that she may have signs of infection which would account for the pain. 05/07/18 on evaluation today patient's wound actually appears to be doing better she's not having as much erythema in the right lower extremity as compared to last week. Fortunately there's no signs of infection this is good news as well. Overall very pleased with the progress that she has made up to this point. CLEOTILDE, SPADACCINI ([id number]) 05/14/18 on evaluation today patient actually appears to be doing rather well in regard to her right lower extremity ulcer. She does not have erythema that was characterizing the cellulitis prior to initiation of antibiotic therapy. With that being said her wound still appears to be somewhat slow in healing I believe this may be arterial in nature that is my concern at least. With that being said I do want her to see the vascular specialist as soon as possible we been trying to get this set up I'm not sure if she's not answering the phone or what exactly is going on here. 05/21/18 on evaluation today patient's wound actually appears to be doing a Krue[place] better possibly some additional granulation compared to my last evaluation. With that being said she still continues to have a lot of pain especially when her legs are elevated such as sitting in the chair here in the office. There does not appear to be any signs of infection which is good news. Patient History Information obtained from Patient. Family History Diabetes - Father,Siblings,Child, Hypertension - Child, Lung Disease - Child, Stroke - Mother, No family history of Cancer, Heart Disease,  Kidney Disease, Seizures, Thyroid Problems. Social History Former smoker, Marital Status - Single, Alcohol Use - Never, Drug Use - No History, Caffeine Use - Daily. Medical History Eyes Patient has history of Cataracts - removed Denies history of Glaucoma, Optic Neuritis Hematologic/Lymphatic Patient has history of Anemia Denies history of Hemophilia, Human Immunodeficiency Virus, Lymphedema, Sickle Cell Disease Respiratory Denies history of Aspiration, Asthma, Chronic Obstructive Pulmonary Disease (COPD), Pneumothorax, Sleep Apnea, Tuberculosis Cardiovascular Patient has history of Arrhythmia, Coronary Artery Disease, Hypertension, Myocardial Infarction Denies history of Angina, Congestive Heart Failure, Deep Vein Thrombosis, Hypotension, Peripheral Arterial Disease, Peripheral Venous Disease, Phlebitis Endocrine Patient has history of Type II Diabetes Denies history of Type I Diabetes Genitourinary Denies history of End Stage Renal Disease Immunological Denies history of Lupus Erythematosus, Raynaud s, Scleroderma Integumentary (Skin) Denies history of History of Burn, History of pressure wounds Musculoskeletal Patient has history of Osteoarthritis Denies history of Gout, Rheumatoid Arthritis, Osteomyelitis Neurologic Denies history of Dementia, Neuropathy, Quadriplegia, Paraplegia, Seizure Disorder Oncologic Denies history of Received Chemotherapy, Received Radiation Medical And Surgical History Notes Constitutional Symptoms (General Health) High Blood Pressure, open heart surgery, gall stone, hysterectomy; Fluid pill, Anxiety Review of Systems (ROS) Constitutional Symptoms (General Health) Denies complaints or symptoms of Fatigue, Fever, Chills. Ramesh, Cariana [name]. ([id number]) Respiratory The patient has no complaints or symptoms. Cardiovascular The patient has no complaints or symptoms. Psychiatric The patient has no complaints or  symptoms. Objective Constitutional Well-nourished and well-hydrated in no acute distress. Vitals Time Taken: 3:57 PM, Height: 58 in, Weight: 140 lbs, BMI: 29.3, Temperature: 98.2 F, Pulse: 75 bpm, Respiratory Rate: 16 breaths/min, Blood Pressure: 140/64 mmHg. Respiratory normal breathing without difficulty. clear to auscultation  bilaterally. Cardiovascular regular rate and rhythm with normal S1, S2. Psychiatric this patient is able to make decisions and demonstrates good insight into disease process. Alert and Oriented x 3. pleasant and cooperative. General Notes: Patient's wound bed currently shows signs of good granulation at this time fortunately there does not appear to be any evidence of overt infection which is good news no sharp debridement was performed today. Overall I do believe that the alginate dressing is still good for her and seeming to help control the drainage there's not any maceration noted at this point which is good news. Overall I'm happy. Integumentary (Hair, Skin) Wound #8 status is Open. Original cause of wound was Trauma. The wound is located on the Right,Lateral Lower Leg. The wound measures 4cm length x 1.7cm width x 0.2cm depth; 5.341cm^2 area and 1.068cm^3 volume. There is Fat Layer (Subcutaneous Tissue) Exposed exposed. There is no tunneling or undermining noted. There is a medium amount of purulent drainage noted. The wound margin is flat and intact. There is small (1-33%) granulation within the wound bed. There is a large (67-100%) amount of necrotic tissue within the wound bed including Adherent Slough. The periwound skin appearance exhibited: Excoriation, Dry/Scaly, Erythema. The periwound skin appearance did not exhibit: Callus, Crepitus, Induration, Rash, Scarring, Maceration, Atrophie Blanche, Cyanosis, Ecchymosis, Hemosiderin Staining, Mottled, Pallor, Rubor. The surrounding wound skin color is noted with erythema which is circumferential. Periwound  temperature was noted as No Abnormality. The periwound has tenderness on palpation. Assessment Active Problems Gaskill, Amparo [name]. ([id number]) ICD-10 Chronic venous hypertension (idiopathic) with ulcer and inflammation of right lower extremity Type 2 diabetes mellitus with other skin ulcer Non-pressure chronic ulcer of other part of right lower leg with fat layer exposed Cellulitis of right lower limb Plan Wound Cleansing: Wound #8 Right,Lateral Lower Leg: Clean wound with Normal Saline. Anesthetic (add to Medication List): Wound #8 Right,Lateral Lower Leg: Topical Lidocaine 4% cream applied to wound bed prior to debridement (In Clinic Only). Primary Wound Dressing: Wound #8 Right,Lateral Lower Leg: Silver Collagen Secondary Dressing: Wound #8 Right,Lateral Lower Leg: ABD and Kerlix/Conform Dressing Change Frequency: Wound #8 Right,Lateral Lower Leg: Change dressing every week Follow-up Appointments: Wound #8 Right,Lateral Lower Leg: Return Appointment in 1 week. Edema Control: Wound #8 Right,Lateral Lower Leg: Elevate legs to the level of the heart and pump ankles as often as possible Other: - tubigrip Home Health: Wound #8 Right,Lateral Lower Leg: Continue Home Health Visits - HOLD patient will appeal discharge per Advanced Letter to patient Home Health Nurse may visit PRN to address patient s wound care needs. FACE TO FACE ENCOUNTER: MEDICARE and MEDICAID PATIENTS: I certify that this patient is under my care and that I had a face-to-face encounter that meets the physician face-to-face encounter requirements with this patient on this date. The encounter with the patient was in whole or in part for the following MEDICAL CONDITION: (primary reason for [place]) MEDICAL NECESSITY: I certify, that based on my findings, NURSING services are a medically necessary home health service. HOME BOUND STATUS: I certify that my clinical findings support that this patient is  homebound (i.e., Due to illness or injury, pt requires aid of supportive devices such as crutches, cane, wheelchairs, walkers, the use of special transportation or the assistance of another person to leave their place of residence. There is a normal inability to leave the home and doing so requires considerable and taxing effort. Other absences are for medical reasons / religious services and are infrequent or  of short duration when for other reasons). If current dressing causes regression in wound condition, may D/[name] ordered dressing product/s and apply Normal Saline Moist Dressing daily until next [place] / Other MD appointment. [place] of regression in wound condition at [phone]. Please direct any NON-WOUND related issues/requests for orders to patient's Primary Care Physician Medications-please add to medication list.: Wound #8 Right,Lateral Lower Leg: P.O. Antibiotics - Complete antibiotics Shannahan, Monya [name]. ([id number]) My suggestion currently is gonna be that we go ahead and continue with the above wound care measures for the next week. The patient is in agreement with plan. We will subsequently see were things stand at follow-up. If anything changes or worsens in the meantime she will contact the office and let me know. Please see above for specific wound care orders. We will see patient for re-evaluation in 1 week(s) here in the clinic. If anything worsens or changes patient will contact our office for additional recommendations. Electronic Signature(s) Signed: 05/22/2018 12:02:18 AM By: Worthy Keeler PA-[name] Entered By: Worthy Keeler on 05/21/2018 17:38:15 Donofrio, [name] ([id number]) -------------------------------------------------------------------------------- ROS/PFSH Details Patient Name: Persico, Ledonna [name]. Date of Service: 05/21/2018 3:15 PM Medical Record Number: [id number] Patient Account Number: [id number] Date of Birth/Sex: 1927-04-07  (85 y.o. F) Treating RN: Harold Barban Primary Care Provider: Ria Bush Other Clinician: Referring Provider: Ria Bush Treating Provider/Extender: Melburn Hake, Marilea Gwynne Weeks in Treatment: 8 Information Obtained From Patient Wound History Constitutional Symptoms (General Health) Complaints and Symptoms: Negative for: Fatigue; Fever; Chills Medical History: Past Medical History Notes: High Blood Pressure, open heart surgery, gall stone, hysterectomy; Fluid pill, Anxiety Eyes Medical History: Positive for: Cataracts - removed Negative for: Glaucoma; Optic Neuritis Hematologic/Lymphatic Medical History: Positive for: Anemia Negative for: Hemophilia; Human Immunodeficiency Virus; Lymphedema; Sickle Cell Disease Respiratory Complaints and Symptoms: No Complaints or Symptoms Medical History: Negative for: Aspiration; Asthma; Chronic Obstructive Pulmonary Disease (COPD); Pneumothorax; Sleep Apnea; Tuberculosis Cardiovascular Complaints and Symptoms: No Complaints or Symptoms Medical History: Positive for: Arrhythmia; Coronary Artery Disease; Hypertension; Myocardial Infarction Negative for: Angina; Congestive Heart Failure; Deep Vein Thrombosis; Hypotension; Peripheral Arterial Disease; Peripheral Venous Disease; Phlebitis Endocrine Medical History: Positive for: Type II Diabetes Negative for: Type I Diabetes Reddish, Calla [name]. ([id number]) Time with diabetes: no idea Treated with: Oral agents Blood sugar tested every day: No Genitourinary Medical History: Negative for: End Stage Renal Disease Immunological Medical History: Negative for: Lupus Erythematosus; Raynaudos; Scleroderma Integumentary (Skin) Medical History: Negative for: History of Burn; History of pressure wounds Musculoskeletal Medical History: Positive for: Osteoarthritis Negative for: Gout; Rheumatoid Arthritis; Osteomyelitis Neurologic Medical History: Negative for: Dementia; Neuropathy;  Quadriplegia; Paraplegia; Seizure Disorder Oncologic Medical History: Negative for: Received Chemotherapy; Received Radiation Psychiatric Complaints and Symptoms: No Complaints or Symptoms HBO Extended History Items Eyes: Cataracts Immunizations Pneumococcal Vaccine: Received Pneumococcal Vaccination: Yes Implantable Devices Family and Social History Cancer: No; Diabetes: Yes - Father,Siblings,Child; Heart Disease: No; Hypertension: Yes - Child; Kidney Disease: No; Lung Disease: Yes - Child; Seizures: No; Stroke: Yes - Mother; Thyroid Problems: No; Former smoker; Marital Status - Single; Alcohol Use: Never; Drug Use: No History; Caffeine Use: Daily; Financial Concerns: No; Food, Clothing or Shelter Needs: No; Support System Lacking: No; Transportation Concerns: No; Advanced Directives: Yes (Not Provided); Patient does not want information on Advanced Directives; Living Will: Yes (Not Provided) Physician Affirmation I have reviewed and agree with the above information. AYELEN, SCIORTINO ([id number]) Electronic Signature(s) Signed: 05/22/2018 12:02:18 AM By: Worthy Keeler PA-[name] Signed: 05/23/2018  8:37:22 AM By: Harold Barban Entered By: Worthy Keeler on 05/21/2018 17:37:32 Rozario, [name] ([id number]) -------------------------------------------------------------------------------- SuperBill Details Patient Name: Haegele, Dennie [name]. Date of Service: 05/21/2018 Medical Record Number: [id number] Patient Account Number: [id number] Date of Birth/Sex: 19-Mar-1928 (85 y.o. F) Treating RN: Harold Barban Primary Care Provider: Ria Bush Other Clinician: Referring Provider: Ria Bush Treating Provider/Extender: Melburn Hake, Amed Datta Weeks in Treatment: 8 Diagnosis Coding ICD-10 Codes Code Description I87.331 Chronic venous hypertension (idiopathic) with ulcer and inflammation of right lower extremity E11.622 Type 2 diabetes mellitus with other skin ulcer L97.812  Non-pressure chronic ulcer of other part of right lower leg with fat layer exposed L03.115 Cellulitis of right lower limb Facility Procedures CPT4 Code: 48250037 Description: 04888 - WOUND CARE VISIT-LEV 2 EST PT Modifier: Quantity: 1 Physician Procedures CPT4: Description Modifier Quantity Code 9169450 99214 - WC PHYS LEVEL 4 - EST PT 1 ICD-10 Diagnosis Description I87.331 Chronic venous hypertension (idiopathic) with ulcer and inflammation of right lower extremity E11.622 Type 2 diabetes mellitus with  other skin ulcer L97.812 Non-pressure chronic ulcer of other part of right lower leg with fat layer exposed L03.115 Cellulitis of right lower limb Electronic Signature(s) Signed: 05/22/2018 12:02:18 AM By: Worthy Keeler PA-[name] Entered By: Worthy Keeler on 05/21/2018 17:38:29

## 2018-05-24 DIAGNOSIS — L986 Other infiltrative disorders of the skin and subcutaneous tissue: Secondary | ICD-10-CM | POA: Diagnosis not present

## 2018-05-24 DIAGNOSIS — D485 Neoplasm of uncertain behavior of skin: Secondary | ICD-10-CM | POA: Diagnosis not present

## 2018-05-24 DIAGNOSIS — L812 Freckles: Secondary | ICD-10-CM | POA: Diagnosis not present

## 2018-05-24 DIAGNOSIS — L570 Actinic keratosis: Secondary | ICD-10-CM | POA: Diagnosis not present

## 2018-05-24 DIAGNOSIS — I788 Other diseases of capillaries: Secondary | ICD-10-CM | POA: Diagnosis not present

## 2018-05-24 DIAGNOSIS — S81801S Unspecified open wound, right lower leg, sequela: Secondary | ICD-10-CM | POA: Diagnosis not present

## 2018-05-24 DIAGNOSIS — D229 Melanocytic nevi, unspecified: Secondary | ICD-10-CM | POA: Diagnosis not present

## 2018-05-24 DIAGNOSIS — C44311 Basal cell carcinoma of skin of nose: Secondary | ICD-10-CM | POA: Diagnosis not present

## 2018-05-24 DIAGNOSIS — R21 Rash and other nonspecific skin eruption: Secondary | ICD-10-CM | POA: Diagnosis not present

## 2018-05-26 NOTE — Progress Notes (Signed)
CHEYENNA, PANKOWSKI ([id number]) Visit Report for 05/21/2018 Arrival Information Details Patient Name: Snowball, Vanesa C. Date of Service: 05/21/2018 3:15 PM Medical Record Number: [id number] Patient Account Number: [id number] Date of Birth/Sex: 08/03/1927 (85 y.o. F) Treating RN: Army Melia Primary Care Allis Quirarte: Ria Bush Other Clinician: Referring Joei Frangos: Ria Bush Treating Kenith Trickel/Extender: Melburn Hake, HOYT Weeks in Treatment: 8 Visit Information History Since Last Visit Added or deleted any medications: No Patient Arrived: [place] Any new allergies or adverse reactions: No Arrival Time: 15:56 Had a fall or experienced change in No Accompanied By: self activities of daily living that may affect Transfer Assistance: None risk of falls: Patient Identification Verified: Yes Signs or symptoms of abuse/neglect since last visito No Hospitalized since last visit: No Implantable device outside of the clinic excluding No cellular tissue based products placed in the center since last visit: Has Dressing in Place as Prescribed: Yes Pain Present Now: Yes Electronic Signature(s) Signed: 05/25/2018 4:09:31 PM By: Army Melia Entered By: Army Melia on 05/21/2018 15:57:25 Dziuba, [name] ([id number]) -------------------------------------------------------------------------------- Clinic Level of Care Assessment Details Patient Name: Horkey, Tichina C. Date of Service: 05/21/2018 3:15 PM Medical Record Number: [id number] Patient Account Number: [id number] Date of Birth/Sex: 24-Oct-1927 (85 y.o. F) Treating RN: Harold Barban Primary Care Breanna Mcdaniel: Ria Bush Other Clinician: Referring Alleigh Mollica: Ria Bush Treating Gaven Eugene/Extender: Melburn Hake, HOYT Weeks in Treatment: 8 Clinic Level of Care Assessment Items TOOL 4 Quantity Score []  - Use when only an EandM is performed on FOLLOW-UP visit 0 ASSESSMENTS - Nursing Assessment / Reassessment X - Reassessment  of Co-morbidities (includes updates in patient status) 1 10 X- 1 5 Reassessment of Adherence to Treatment Plan ASSESSMENTS - Wound and Skin Assessment / Reassessment X - Simple Wound Assessment / Reassessment - one wound 1 5 []  - 0 Complex Wound Assessment / Reassessment - multiple wounds []  - 0 Dermatologic / Skin Assessment (not related to wound area) ASSESSMENTS - Focused Assessment []  - Circumferential Edema Measurements - multi extremities 0 []  - 0 Nutritional Assessment / Counseling / Intervention []  - 0 Lower Extremity Assessment (monofilament, tuning fork, pulses) []  - 0 Peripheral Arterial Disease Assessment (using hand held doppler) ASSESSMENTS - Ostomy and/or Continence Assessment and Care []  - Incontinence Assessment and Management 0 []  - 0 Ostomy Care Assessment and Management (repouching, etc.) PROCESS - Coordination of Care X - Simple Patient / Family Education for ongoing care 1 15 []  - 0 Complex (extensive) Patient / Family Education for ongoing care []  - 0 Staff obtains Programmer, systems, Records, Test Results / Process Orders []  - 0 Staff telephones HHA, Nursing Homes / Clarify orders / etc []  - 0 Routine Transfer to another Facility (non-emergent condition) []  - 0 Routine Hospital Admission (non-emergent condition) []  - 0 New Admissions / Biomedical engineer / Ordering NPWT, Apligraf, etc. []  - 0 Emergency Hospital Admission (emergent condition) X- 1 10 Simple Discharge Coordination Thorns, Meenakshi C. ([id number]) []  - 0 Complex (extensive) Discharge Coordination PROCESS - Special Needs []  - Pediatric / Minor Patient Management 0 []  - 0 Isolation Patient Management []  - 0 Hearing / Language / Visual special needs []  - 0 Assessment of Community assistance (transportation, D/C planning, etc.) []  - 0 Additional assistance / Altered mentation []  - 0 Support Surface(s) Assessment (bed, cushion, seat, etc.) INTERVENTIONS - Wound Cleansing /  Measurement X - Simple Wound Cleansing - one wound 1 5 []  - 0 Complex Wound Cleansing - multiple wounds X- 1 5 Wound Imaging (photographs - any number of  wounds) []  - 0 Wound Tracing (instead of photographs) X- 1 5 Simple Wound Measurement - one wound []  - 0 Complex Wound Measurement - multiple wounds INTERVENTIONS - Wound Dressings X - Small Wound Dressing one or multiple wounds 1 10 []  - 0 Medium Wound Dressing one or multiple wounds []  - 0 Large Wound Dressing one or multiple wounds []  - 0 Application of Medications - topical []  - 0 Application of Medications - injection INTERVENTIONS - Miscellaneous []  - External ear exam 0 []  - 0 Specimen Collection (cultures, biopsies, blood, body fluids, etc.) []  - 0 Specimen(s) / Culture(s) sent or taken to Lab for analysis []  - 0 Patient Transfer (multiple staff / Civil Service fast streamer / Similar devices) []  - 0 Simple Staple / Suture removal (25 or less) []  - 0 Complex Staple / Suture removal (26 or more) []  - 0 Hypo / Hyperglycemic Management (close monitor of Blood Glucose) []  - 0 Ankle / Brachial Index (ABI) - do not check if billed separately X- 1 5 Vital Signs Siers, Breawna C. ([id number]) Has the patient been seen at the hospital within the last three years: Yes Total Score: 75 Level Of Care: New/Established - Level 2 Electronic Signature(s) Signed: 05/23/2018 8:37:22 AM By: Harold Barban Entered By: Harold Barban on 05/21/2018 16:24:15 Boivin, [name] ([id number]) -------------------------------------------------------------------------------- Lower Extremity Assessment Details Patient Name: Way, Yetta C. Date of Service: 05/21/2018 3:15 PM Medical Record Number: [id number] Patient Account Number: [id number] Date of Birth/Sex: 1927-12-25 (85 y.o. F) Treating RN: Army Melia Primary Care Nandika Stetzer: Ria Bush Other Clinician: Referring Datra Clary: Ria Bush Treating Ismelda Weatherman/Extender: STONE III,  HOYT Weeks in Treatment: 8 Edema Assessment Assessed: [Left: No] [Right: No] Edema: [Left: N] [Right: o] Vascular Assessment Pulses: Posterior Tibial Extremity colors, hair growth, and conditions: Extremity Color: [Right:Normal] Hair Growth on Extremity: [Right:No] Temperature of Extremity: [Right:Warm] Capillary Refill: [Right:< 3 seconds] Toe Nail Assessment Left: Right: Thick: No Discolored: No Deformed: No Improper Length and Hygiene: No Electronic Signature(s) Signed: 05/25/2018 4:09:31 PM By: Army Melia Entered By: Army Melia on 05/21/2018 16:03:32 Wuertz, Kadasia C. ([id number]) -------------------------------------------------------------------------------- Multi Wound Chart Details Patient Name: Jamison, Cassidie C. Date of Service: 05/21/2018 3:15 PM Medical Record Number: [id number] Patient Account Number: [id number] Date of Birth/Sex: 08/21/1927 (85 y.o. F) Treating RN: Harold Barban Primary Care Shanta Dorvil: Ria Bush Other Clinician: Referring Anaeli Cornwall: Ria Bush Treating Katryna Tschirhart/Extender: STONE III, HOYT Weeks in Treatment: 8 Vital Signs Height(in): 58 Pulse(bpm): 75 Weight(lbs): 140 Blood Pressure(mmHg): 140/64 Body Mass Index(BMI): 29 Temperature(F): 98.2 Respiratory Rate 16 (breaths/min): Photos: [8:No Photos] [N/A:N/A] Wound Location: [8:Right Lower Leg - Lateral] [N/A:N/A] Wounding Event: [8:Trauma] [N/A:N/A] Primary Etiology: [8:Diabetic Wound/Ulcer of the Lower Extremity] [N/A:N/A] Comorbid History: [8:Cataracts, Anemia, Arrhythmia, Coronary Artery Disease, Hypertension, Myocardial Infarction, Type II Diabetes, Osteoarthritis] [N/A:N/A] Date Acquired: [8:03/09/2018] [N/A:N/A] Weeks of Treatment: [8:8] [N/A:N/A] Wound Status: [8:Open] [N/A:N/A] Measurements L x W x D [8:4x1.7x0.2] [N/A:N/A] (cm) Area (cm) : [8:5.341] [N/A:N/A] Volume (cm) : [8:1.068] [N/A:N/A] % Reduction in Area: [8:-183.30%] [N/A:N/A] % Reduction in  Volume: [8:-89.00%] [N/A:N/A] Classification: [8:Grade 2] [N/A:N/A] Exudate Amount: [8:Medium] [N/A:N/A] Exudate Type: [8:Purulent] [N/A:N/A] Exudate Color: [8:yellow, brown, green] [N/A:N/A] Wound Margin: [8:Flat and Intact] [N/A:N/A] Granulation Amount: [8:Small (1-33%)] [N/A:N/A] Necrotic Amount: [8:Large (67-100%)] [N/A:N/A] Exposed Structures: [8:Fat Layer (Subcutaneous Tissue) Exposed: Yes Fascia: No Tendon: No Muscle: No Joint: No Bone: No] [N/A:N/A] Epithelialization: [8:None] [N/A:N/A] Periwound Skin Texture: [8:Excoriation: Yes Induration: No Callus: No] [N/A:N/A] Crepitus: No Rash: No Scarring: No Periwound Skin Moisture: Dry/Scaly: Yes N/A N/A Maceration:  No Periwound Skin Color: Erythema: Yes N/A N/A Atrophie Blanche: No Cyanosis: No Ecchymosis: No Hemosiderin Staining: No Mottled: No Pallor: No Rubor: No Erythema Location: Circumferential N/A N/A Temperature: No Abnormality N/A N/A Tenderness on Palpation: Yes N/A N/A Wound Preparation: Ulcer Cleansing: N/A N/A Rinsed/Irrigated with Saline Topical Anesthetic Applied: Other: lidocaine 4% Treatment Notes Electronic Signature(s) Signed: 05/23/2018 8:37:22 AM By: Harold Barban Entered By: Harold Barban on 05/21/2018 16:15:31 Edgin, [name] ([id number]) -------------------------------------------------------------------------------- [place] Details Patient Name: Tomaro, Minola C. Date of Service: 05/21/2018 3:15 PM Medical Record Number: [id number] Patient Account Number: [id number] Date of Birth/Sex: [birth year] (85 y.o. F) Treating RN: Harold Barban Primary Care Leela Vanbrocklin: Ria Bush Other Clinician: Referring Surie Suchocki: Ria Bush Treating Attie Nawabi/Extender: Melburn Hake, HOYT Weeks in Treatment: 8 Active Inactive Abuse / Safety / Falls / Self Care Management Nursing Diagnoses: Potential for falls Goals: Patient will not experience any injury related to falls Date  Initiated: 03/23/2018 Target Resolution Date: 06/09/2018 Goal Status: Active Interventions: Assess fall risk on admission and as needed Notes: Orientation to the Wound Care Program Nursing Diagnoses: Knowledge deficit related to the wound healing center program Goals: Patient/caregiver will verbalize understanding of the [place] Program Date Initiated: 03/23/2018 Target Resolution Date: 06/09/2018 Goal Status: Active Interventions: Provide education on orientation to the wound center Notes: Pain, Acute or Chronic Nursing Diagnoses: Pain, acute or chronic: actual or potential Goals: Patient will verbalize adequate pain control and receive pain control interventions during procedures as needed Date Initiated: 03/23/2018 Target Resolution Date: 06/09/2018 Goal Status: Active Interventions: Assess comfort goal upon admission Lords, Lundyn C. ([id number]) Notes: Wound/Skin Impairment Nursing Diagnoses: Impaired tissue integrity Goals: Ulcer/skin breakdown will heal within 14 weeks Date Initiated: 03/23/2018 Target Resolution Date: 06/09/2018 Goal Status: Active Interventions: Assess patient/caregiver ability to obtain necessary supplies Assess patient/caregiver ability to perform ulcer/skin care regimen upon admission and as needed Assess ulceration(s) every visit Notes: Electronic Signature(s) Signed: 05/23/2018 8:37:22 AM By: Harold Barban Entered By: Harold Barban on 05/21/2018 16:15:22 Mcandrew, Chelsye C. ([id number]) -------------------------------------------------------------------------------- Pain Assessment Details Patient Name: Lachney, Coda C. Date of Service: 05/21/2018 3:15 PM Medical Record Number: [id number] Patient Account Number: [id number] Date of Birth/Sex: [birth year] (85 y.o. F) Treating RN: Army Melia Primary Care Shaiden Aldous: Ria Bush Other Clinician: Referring Dalaysia Harms: Ria Bush Treating Genesia Caslin/Extender: Melburn Hake,  HOYT Weeks in Treatment: 8 Active Problems Location of Pain Severity and Description of Pain Patient Has [name] Yes Site Locations Pain Location: Generalized Pain, Pain in Ulcers With Dressing Change: Yes Duration of the Pain. Constant / Intermittento Constant Rate the pain. Current Pain Level: 7 Pain Management and Medication Current Pain Management: Electronic Signature(s) Signed: 05/25/2018 4:09:31 PM By: Army Melia Entered By: Army Melia on 05/21/2018 15:57:52 Enterline, [name] ([id number]) -------------------------------------------------------------------------------- Patient/Caregiver Education Details Patient Name: Larcom, Goldia C. Date of Service: 05/21/2018 3:15 PM Medical Record Number: [id number] Patient Account Number: [id number] Date of Birth/Gender: [birth year] (85 y.o. F) Treating RN: Harold Barban Primary Care Physician: Ria Bush Other Clinician: Referring Physician: Ria Bush Treating Physician/Extender: Sharalyn Ink in Treatment: 8 Education Assessment Education Provided To: Patient Education Topics Provided Wound/Skin Impairment: Handouts: Caring for Your Ulcer Methods: Demonstration, Explain/Verbal Responses: State content correctly Electronic Signature(s) Signed: 05/23/2018 8:37:22 AM By: Harold Barban Entered By: Harold Barban on 05/21/2018 16:15:50 Rohm, [name] ([id number]) -------------------------------------------------------------------------------- Wound Assessment Details Patient Name: Joe, Tyronda C. Date of Service: 05/21/2018 3:15 PM Medical Record Number: [id number] Patient Account Number: [id number] Date of Birth/Sex: Aug 06, 1927 (85 y.o. F) Treating  RN: Army Melia Primary Care Katera Rybka: Ria Bush Other Clinician: Referring Karlena Luebke: Ria Bush Treating Jame Seelig/Extender: STONE III, HOYT Weeks in Treatment: 8 Wound Status Wound Number: 8 Primary Diabetic Wound/Ulcer of the Lower  Extremity Etiology: Wound Location: Right Lower Leg - Lateral Wound Open Wounding Event: Trauma Status: Date Acquired: 03/09/2018 Comorbid Cataracts, Anemia, Arrhythmia, Coronary Artery Weeks Of Treatment: 8 History: Disease, Hypertension, Myocardial Infarction, Clustered Wound: No Type II Diabetes, Osteoarthritis Photos Photo Uploaded By: Army Melia on 05/22/2018 09:58:14 Wound Measurements Length: (cm) 4 Width: (cm) 1.7 Depth: (cm) 0.2 Area: (cm) 5.341 Volume: (cm) 1.068 % Reduction in Area: -183.3% % Reduction in Volume: -89% Epithelialization: None Tunneling: No Undermining: No Wound Description Classification: Grade 2 Wound Margin: Flat and Intact Exudate Amount: Medium Exudate Type: Purulent Exudate Color: yellow, brown, green Foul Odor After Cleansing: No Slough/Fibrino Yes Wound Bed Granulation Amount: Small (1-33%) Exposed Structure Necrotic Amount: Large (67-100%) Fascia Exposed: No Necrotic Quality: Adherent Slough Fat Layer (Subcutaneous Tissue) Exposed: Yes Tendon Exposed: No Muscle Exposed: No Joint Exposed: No Bone Exposed: No Periwound Skin Texture Gilday, Carmel C. ([id number]) Texture Color No Abnormalities Noted: No No Abnormalities Noted: No Callus: No Atrophie Blanche: No Crepitus: No Cyanosis: No Excoriation: Yes Ecchymosis: No Induration: No Erythema: Yes Rash: No Erythema Location: Circumferential Scarring: No Hemosiderin Staining: No Mottled: No Moisture Pallor: No No Abnormalities Noted: No Rubor: No Dry / Scaly: Yes Maceration: No Temperature / Pain Temperature: No Abnormality Tenderness on Palpation: Yes Wound Preparation Ulcer Cleansing: Rinsed/Irrigated with Saline Topical Anesthetic Applied: Other: lidocaine 4%, Electronic Signature(s) Signed: 05/25/2018 4:09:31 PM By: Army Melia Entered By: Army Melia on 05/21/2018 16:02:58 Gudgel, [name]  ([id number]) -------------------------------------------------------------------------------- Vitals Details Patient Name: Recendiz, Brittanee C. Date of Service: 05/21/2018 3:15 PM Medical Record Number: [id number] Patient Account Number: [id number] Date of Birth/Sex: [birth year] (85 y.o. F) Treating RN: Army Melia Primary Care Jodi Criscuolo: Ria Bush Other Clinician: Referring Margarite Vessel: Ria Bush Treating Chinedum Vanhouten/Extender: Melburn Hake, HOYT Weeks in Treatment: 8 Vital Signs Time Taken: 15:57 Temperature (F): 98.2 Height (in): 58 Pulse (bpm): 75 Weight (lbs): 140 Respiratory Rate (breaths/min): 16 Body Mass Index (BMI): 29.3 Blood Pressure (mmHg): 140/64 Reference Range: 80 - 120 mg / dl Electronic Signature(s) Signed: 05/25/2018 4:09:31 PM By: Army Melia Entered By: Army Melia on 05/21/2018 15:58:47

## 2018-05-28 ENCOUNTER — Encounter: Payer: Medicare Other | Admitting: Physician Assistant

## 2018-05-28 DIAGNOSIS — L03115 Cellulitis of right lower limb: Secondary | ICD-10-CM | POA: Diagnosis not present

## 2018-05-28 DIAGNOSIS — L97812 Non-pressure chronic ulcer of other part of right lower leg with fat layer exposed: Secondary | ICD-10-CM | POA: Diagnosis not present

## 2018-05-28 DIAGNOSIS — I252 Old myocardial infarction: Secondary | ICD-10-CM | POA: Diagnosis not present

## 2018-05-28 DIAGNOSIS — I87331 Chronic venous hypertension (idiopathic) with ulcer and inflammation of right lower extremity: Secondary | ICD-10-CM | POA: Diagnosis not present

## 2018-05-28 DIAGNOSIS — E11622 Type 2 diabetes mellitus with other skin ulcer: Secondary | ICD-10-CM | POA: Diagnosis not present

## 2018-05-28 DIAGNOSIS — Z87891 Personal history of nicotine dependence: Secondary | ICD-10-CM | POA: Diagnosis not present

## 2018-05-29 NOTE — Progress Notes (Signed)
[name] ([id number]) Visit Report for 05/28/2018 Chief Complaint Document Details Patient Name: [name], [name] Date of Service: 05/28/2018 3:00 PM Medical Record Number: [id number] Patient Account Number: [id number] Date of Birth/Sex: 11-Feb-1928 (85 y.o. F) Treating RN: Harold Barban Primary Care Provider: Ria Bush Other Clinician: Referring Provider: Ria Bush Treating Provider/Extender: Melburn Hake, HOYT Weeks in Treatment: 9 Information Obtained from: Patient Chief Complaint RLE wounds Electronic Signature(s) Signed: 05/29/2018 8:52:26 AM By: Worthy Keeler PA-C Entered By: Worthy Keeler on 05/28/2018 15:28:31 [name] ([id number]) -------------------------------------------------------------------------------- HPI Details Patient Name: [name], [name] Date of Service: 05/28/2018 3:00 PM Medical Record Number: [id number] Patient Account Number: [id number] Date of Birth/Sex: 1928-03-06 (85 y.o. F) Treating RN: Harold Barban Primary Care Provider: Ria Bush Other Clinician: Referring Provider: Ria Bush Treating Provider/Extender: Melburn Hake, HOYT Weeks in Treatment: 9 History of Present Illness HPI Description: Readmission: 03/23/18 on evaluation today patient presents for readmission entire clinic concerning issues that she has been having for the past couple of weeks in regard to her right lower extremity. The medial injury actually occurred when her dog tried to take a blanket from her and subsequently scratched her with his toenail. Subsequently the lateral injury was a wound where she dropped a knife in her kitchen and hit the side of her leg. With that being said both have been open for some time and home health nurse that has been coming out marked for her where the redness was several days ago. Nonetheless this has spread will be on the margin of what was marked. She was placed on Keflex which was started two days ago  but at this point it does not seem to have helped with any improvement in the overall appearance of the wounds. No fevers, chills, nausea, or vomiting noted at this time. 04/02/18 Seen today for follow up and management of right lower extremity wounds. Right lateral wound with adherent slough. She is complaining of pain to touch which limited exam. Recently treated in the ED for cellulitis and wound culture positive for MRSA. Completed a dose of Vancomycin and transitioned to doxycycline by mouth. Erythema remains present with improvement since last visit. Right medial wound with scab present. No fevers, chills, nausea, or vomiting noted at this time. 04/10/18 on evaluation today patient appears to be doing a Mathews better in regard to her right lateral lower [name]. The slough does seem to be coming off although it is slow from the wound bed. Fortunately there does not appear to be any evidence of infection at this time which is good news. With that being said I do believe that she is going to require longer treatment with the Santyl simply due to the fact that [name] not able to really debride the wound due to the pain that she is experiencing. 04/16/18 upon evaluation today patient's wound bed currently on the lateral lower extremity on the right appears to be doing a Danser better at this point. We are utilizing sensible currently. Overall [name] very pleased with how things are progressing although there still is some work to do before this will be completely healed. No fevers, chills, nausea, or vomiting noted at this time. 04/23/18 on evaluation today patient appears to be doing about the same at this point maybe just a [place] bit smaller than last week's evaluation. She continues to have some slough buildup on the surface of the wound and has very Hillmer granulation unfortunately. This seems to be very slow moving  [name] getting somewhat concerned about the possibility of her having decreased  blood flow which is leading to her subsequently not being able to heal this area as quickly as she shut otherwise. She is currently utilizing Santyl. 05/01/18 on evaluation today patient appears to be doing somewhat poorly in regard to her lower extremity ulcer. At this point she's been tolerating the dressing changes to some degree without complication. With that being said she does note of the past several days that she has been having more pain unfortunately. On inspection it appears that she may have signs of infection which would account for the pain. 05/07/18 on evaluation today patient's wound actually appears to be doing better she's not having as much erythema in the right lower extremity as compared to last week. Fortunately there's no signs of infection this is good news as well. Overall very pleased with the progress that she has made up to this point. 05/14/18 on evaluation today patient actually appears to be doing rather well in regard to her right lower extremity ulcer. She does not have erythema that was characterizing the cellulitis prior to initiation of antibiotic therapy. With that being said her wound still appears to be somewhat slow in healing I believe this may be arterial in nature that is my concern at least. With that being said I do want her to see the vascular specialist as soon as possible we been trying to get this set up [name] not sure if she's not answering the phone or what exactly is going on here. [name] ([id number]) 05/21/18 on evaluation today patient's wound actually appears to be doing a [place] better possibly some additional granulation compared to my last evaluation. With that being said she still continues to have a lot of pain especially when her legs are elevated such as sitting in the chair here in the office. There does not appear to be any signs of infection which is good news. 05/28/18 on evaluation today patient appears to be doing a [place]  better in regard to her right lateral lower [name]. Again she was supposed to have an appointment with vascular unfortunately they attempted to call her three times and unfortunately were unable to get anything scheduled. That reason closed up for him. She tells me that if the phone is answered and she doesn't recognize what is that she will hang up as well those that she lives with. Obviously she's not gonna recognize somebody calling from the office that she has not previously spoken with. Nonetheless the patient states that she does not remember any fall. I think if we're gonna get her in with vascular specialist we're gonna have to try to make the appointment for her. Electronic Signature(s) Signed: 05/29/2018 8:52:26 AM By: Worthy Keeler PA-C Entered By: Worthy Keeler on 05/28/2018 22:57:15 Felter, [name] ([id number]) -------------------------------------------------------------------------------- Physical Exam Details Patient Name: [name], [name] Date of Service: 05/28/2018 3:00 PM Medical Record Number: [id number] Patient Account Number: [id number] Date of Birth/Sex: March 01, 1928 (85 y.o. F) Treating RN: Harold Barban Primary Care Provider: Ria Bush Other Clinician: Referring Provider: Ria Bush Treating Provider/Extender: STONE III, HOYT Weeks in Treatment: 9 Constitutional Well-nourished and well-hydrated in no acute distress. Respiratory normal breathing without difficulty. clear to auscultation bilaterally. Cardiovascular regular rate and rhythm with normal S1, S2. Psychiatric this patient is able to make decisions and demonstrates good insight into disease process. Alert and Oriented x 3. pleasant and cooperative. Notes Patient's wound did not touch necessitates  sharp debridement today especially in light of the fact that [name] concerned about of blood flow I did not want to go down this road. Subsequently I am gonna recommend that we continue  with the current measures things do seem to be slightly improved compared point is. Electronic Signature(s) Signed: 05/29/2018 8:52:26 AM By: Worthy Keeler PA-C Entered By: Worthy Keeler on 05/28/2018 22:59:24 [name], [name] ([id number]) -------------------------------------------------------------------------------- Physician Orders Details Patient Name: [name], [name] Date of Service: 05/28/2018 3:00 PM Medical Record Number: [id number] Patient Account Number: [id number] Date of Birth/Sex: 10/17/1927 (85 y.o. F) Treating RN: Harold Barban Primary Care Provider: Ria Bush Other Clinician: Referring Provider: Ria Bush Treating Provider/Extender: Melburn Hake, HOYT Weeks in Treatment: 9 Verbal / Phone Orders: No Diagnosis Coding ICD-10 Coding Code Description I87.331 Chronic venous hypertension (idiopathic) with ulcer and inflammation of right lower extremity E11.622 Type 2 diabetes mellitus with other skin ulcer L97.812 Non-pressure chronic ulcer of other part of right lower leg with fat layer exposed L03.115 Cellulitis of right lower limb Wound Cleansing Wound #8 Right,Lateral Lower Leg o Clean wound with Normal Saline. Anesthetic (add to Medication List) Wound #8 Right,Lateral Lower Leg o Topical Lidocaine 4% cream applied to wound bed prior to debridement (In Clinic Only). Primary Wound Dressing Wound #8 Right,Lateral Lower Leg o Silver Collagen Secondary Dressing Wound #8 Right,Lateral Lower Leg o ABD and Kerlix/Conform Dressing Change Frequency Wound #8 Right,Lateral Lower Leg o Change dressing every week Follow-up Appointments Wound #8 Right,Lateral Lower Leg o Return Appointment in 1 week. Edema Control Wound #8 Right,Lateral Lower Leg o Elevate legs to the level of the heart and pump ankles as often as possible o Other: - [place] #8 Right,Lateral Lower Leg o [place] Visits - HOLD patient  will appeal discharge per Advanced Letter to patient o Home Health Nurse may visit PRN to address patientos wound care needs. ELAYJAH, CHANEY ([id number]) o FACE TO FACE ENCOUNTER: MEDICARE and MEDICAID PATIENTS: I certify that this patient is under my care and that I had a face-to-face encounter that meets the physician face-to-face encounter requirements with this patient on this date. The encounter with the patient was in whole or in part for the following MEDICAL CONDITION: (primary reason for Christian) MEDICAL NECESSITY: I certify, that based on my findings, NURSING services are a medically necessary home health service. HOME BOUND STATUS: I certify that my clinical findings support that this patient is homebound (i.e., Due to illness or injury, pt requires aid of supportive devices such as crutches, cane, wheelchairs, walkers, the use of special transportation or the assistance of another person to leave their place of residence. There is a normal inability to leave the home and doing so requires considerable and taxing effort. Other absences are for medical reasons / religious services and are infrequent or of short duration when for other reasons). o If current dressing causes regression in wound condition, may D/C ordered dressing product/s and apply Normal Saline Moist Dressing daily until next [place] / Other MD appointment. [place] of regression in wound condition at [phone]. o Please direct any NON-WOUND related issues/requests for orders to patient's Primary Care Physician Medications-please add to medication list. Wound #8 Right,Lateral Lower Leg o P.O. Antibiotics - Complete antibiotics Electronic Signature(s) Signed: 05/29/2018 8:52:26 AM By: Worthy Keeler PA-C Signed: 05/29/2018 11:13:25 AM By: Harold Barban Entered By: Harold Barban on 05/28/2018 16:05:11 [name], [name]  ([id number]) -------------------------------------------------------------------------------- Problem  List Details Patient Name: [name], [name] Date of Service: 05/28/2018 3:00 PM Medical Record Number: [id number] Patient Account Number: [id number] Date of Birth/Sex: 05-29-1927 (85 y.o. F) Treating RN: Harold Barban Primary Care Provider: Ria Bush Other Clinician: Referring Provider: Ria Bush Treating Provider/Extender: Melburn Hake, HOYT Weeks in Treatment: 9 Active Problems ICD-10 Evaluated Encounter Code Description Active Date Today Diagnosis I87.331 Chronic venous hypertension (idiopathic) with ulcer and 03/23/2018 No Yes inflammation of right lower extremity E11.622 Type 2 diabetes mellitus with other skin ulcer 03/23/2018 No Yes L97.812 Non-pressure chronic ulcer of other part of right lower leg 03/23/2018 No Yes with fat layer exposed L03.115 Cellulitis of right lower limb 03/23/2018 No Yes Inactive Problems Resolved Problems Electronic Signature(s) Signed: 05/29/2018 8:52:26 AM By: Worthy Keeler PA-C Entered By: Worthy Keeler on 05/28/2018 15:28:26 Bryk, [name] ([id number]) -------------------------------------------------------------------------------- Progress Note Details Patient Name: [name], [name] Date of Service: 05/28/2018 3:00 PM Medical Record Number: [id number] Patient Account Number: [id number] Date of Birth/Sex: [birth year] (85 y.o. F) Treating RN: Harold Barban Primary Care Provider: Ria Bush Other Clinician: Referring Provider: Ria Bush Treating Provider/Extender: Melburn Hake, HOYT Weeks in Treatment: 9 Subjective Chief Complaint Information obtained from Patient RLE wounds History of Present Illness (HPI) Readmission: 03/23/18 on evaluation today patient presents for readmission entire clinic concerning issues that she has been having for the past couple of weeks in regard to her right lower extremity.  The medial injury actually occurred when her dog tried to take a blanket from her and subsequently scratched her with his toenail. Subsequently the lateral injury was a wound where she dropped a knife in her kitchen and hit the side of her leg. With that being said both have been open for some time and home health nurse that has been coming out marked for her where the redness was several days ago. Nonetheless this has spread will be on the margin of what was marked. She was placed on Keflex which was started two days ago but at this point it does not seem to have helped with any improvement in the overall appearance of the wounds. No fevers, chills, nausea, or vomiting noted at this time. 04/02/18 Seen today for follow up and management of right lower extremity wounds. Right lateral wound with adherent slough. She is complaining of pain to touch which limited exam. Recently treated in the ED for cellulitis and wound culture positive for MRSA. Completed a dose of Vancomycin and transitioned to doxycycline by mouth. Erythema remains present with improvement since last visit. Right medial wound with scab present. No fevers, chills, nausea, or vomiting noted at this time. 04/10/18 on evaluation today patient appears to be doing a Rozario better in regard to her right lateral lower [name]. The slough does seem to be coming off although it is slow from the wound bed. Fortunately there does not appear to be any evidence of infection at this time which is good news. With that being said I do believe that she is going to require longer treatment with the Santyl simply due to the fact that [name] not able to really debride the wound due to the pain that she is experiencing. 04/16/18 upon evaluation today patient's wound bed currently on the lateral lower extremity on the right appears to be doing a Rorie better at this point. We are utilizing sensible currently. Overall [name] very pleased with how things are  progressing although there still is some work to do before this will be completely  healed. No fevers, chills, nausea, or vomiting noted at this time. 04/23/18 on evaluation today patient appears to be doing about the same at this point maybe just a Jacot bit smaller than last week's evaluation. She continues to have some slough buildup on the surface of the wound and has very Mccaffery granulation unfortunately. This seems to be very slow moving [name] getting somewhat concerned about the possibility of her having decreased blood flow which is leading to her subsequently not being able to heal this area as quickly as she shut otherwise. She is currently utilizing Santyl. 05/01/18 on evaluation today patient appears to be doing somewhat poorly in regard to her lower extremity ulcer. At this point she's been tolerating the dressing changes to some degree without complication. With that being said she does note of the past several days that she has been having more pain unfortunately. On inspection it appears that she may have signs of infection which would account for the pain. 05/07/18 on evaluation today patient's wound actually appears to be doing better she's not having as much erythema in the right lower extremity as compared to last week. Fortunately there's no signs of infection this is good news as well. Overall very pleased with the progress that she has made up to this point. [name], [name] ([id number]) 05/14/18 on evaluation today patient actually appears to be doing rather well in regard to her right lower extremity ulcer. She does not have erythema that was characterizing the cellulitis prior to initiation of antibiotic therapy. With that being said her wound still appears to be somewhat slow in healing I believe this may be arterial in nature that is my concern at least. With that being said I do want her to see the vascular specialist as soon as possible we been trying to get this set up [name]  not sure if she's not answering the phone or what exactly is going on here. 05/21/18 on evaluation today patient's wound actually appears to be doing a Heinkel better possibly some additional granulation compared to my last evaluation. With that being said she still continues to have a lot of pain especially when her legs are elevated such as sitting in the chair here in the office. There does not appear to be any signs of infection which is good news. 05/28/18 on evaluation today patient appears to be doing a C[place] better in regard to her right lateral lower [name]. Again she was supposed to have an appointment with vascular unfortunately they attempted to call her three times and unfortunately were unable to get anything scheduled. That reason closed up for him. She tells me that if the phone is answered and she doesn't recognize what is that she will hang up as well those that she lives with. Obviously she's not gonna recognize somebody calling from the office that she has not previously spoken with. Nonetheless the patient states that she does not remember any fall. I think if we're gonna get her in with vascular specialist we're gonna have to try to make the appointment for her. Patient History Information obtained from Patient. Family History Diabetes - Father,Siblings,Child, Hypertension - Child, Lung Disease - Child, Stroke - Mother, No family history of Cancer, Heart Disease, Kidney Disease, Seizures, Thyroid Problems. Social History Former smoker, Marital Status - Single, Alcohol Use - Never, Drug Use - No History, Caffeine Use - Daily. Medical History Eyes Patient has history of Cataracts - removed Denies history of Glaucoma, Optic Neuritis Hematologic/Lymphatic Patient  has history of Anemia Denies history of Hemophilia, Human Immunodeficiency Virus, Lymphedema, Sickle Cell Disease Respiratory Denies history of Aspiration, Asthma, Chronic Obstructive Pulmonary Disease  (COPD), Pneumothorax, Sleep Apnea, Tuberculosis Cardiovascular Patient has history of Arrhythmia, Coronary Artery Disease, Hypertension, Myocardial Infarction Denies history of Angina, Congestive Heart Failure, Deep Vein Thrombosis, Hypotension, Peripheral Arterial Disease, Peripheral Venous Disease, Phlebitis Endocrine Patient has history of Type II Diabetes Denies history of Type I Diabetes Genitourinary Denies history of End Stage Renal Disease Immunological Denies history of Lupus Erythematosus, Raynaud s, Scleroderma Integumentary (Skin) Denies history of History of Burn, History of pressure wounds Musculoskeletal Patient has history of Osteoarthritis Denies history of Gout, Rheumatoid Arthritis, Osteomyelitis Neurologic Denies history of Dementia, Neuropathy, Quadriplegia, Paraplegia, Seizure Disorder Oncologic Denies history of Received Chemotherapy, Received Radiation Wooley, [name] ([id number]) Medical And Surgical History Notes Constitutional Symptoms (General Health) High Blood Pressure, open heart surgery, gall stone, hysterectomy; Fluid pill, Anxiety Review of Systems (ROS) Constitutional Symptoms (General Health) Denies complaints or symptoms of Fever, Chills. Respiratory The patient has no complaints or symptoms. Cardiovascular The patient has no complaints or symptoms. Psychiatric The patient has no complaints or symptoms. Objective Constitutional Well-nourished and well-hydrated in no acute distress. Vitals Time Taken: 3:25 PM, Height: 58 in, Weight: 140 lbs, BMI: 29.3, Temperature: 97.9 F, Pulse: 84 bpm, Respiratory Rate: 18 breaths/min, Blood Pressure: 137/61 mmHg. Respiratory normal breathing without difficulty. clear to auscultation bilaterally. Cardiovascular regular rate and rhythm with normal S1, S2. Psychiatric this patient is able to make decisions and demonstrates good insight into disease process. Alert and Oriented x 3. pleasant and  cooperative. General Notes: Patient's wound did not touch necessitates sharp debridement today especially in light of the fact that [name] concerned about of blood flow I did not want to go down this road. Subsequently I am gonna recommend that we continue with the current measures things do seem to be slightly improved compared point is. Integumentary (Hair, Skin) Wound #8 status is Open. Original cause of wound was Trauma. The wound is located on the Right,Lateral Lower Leg. The wound measures 4cm length x 1.5cm width x 0.2cm depth; 4.712cm^2 area and 0.942cm^3 volume. There is Fat Layer (Subcutaneous Tissue) Exposed exposed. There is no tunneling or undermining noted. There is a medium amount of purulent drainage noted. The wound margin is flat and intact. There is small (1-33%) red granulation within the wound bed. There is a large (67-100%) amount of necrotic tissue within the wound bed including Adherent Slough. The periwound skin appearance exhibited: Excoriation, Dry/Scaly, Erythema. The periwound skin appearance did not exhibit: Callus, Crepitus, Induration, Rash, Scarring, Maceration, Atrophie Blanche, Cyanosis, Ecchymosis, Hemosiderin Staining, Mottled, Pallor, Rubor. The surrounding wound skin color is noted with erythema which is circumferential. Periwound temperature was noted as No Abnormality. The periwound has tenderness on palpation. MAIRANY, BRUNO ([id number]) Assessment Active Problems ICD-10 Chronic venous hypertension (idiopathic) with ulcer and inflammation of right lower extremity Type 2 diabetes mellitus with other skin ulcer Non-pressure chronic ulcer of other part of right lower leg with fat layer exposed Cellulitis of right lower limb Plan Wound Cleansing: Wound #8 Right,Lateral Lower Leg: Clean wound with Normal Saline. Anesthetic (add to Medication List): Wound #8 Right,Lateral Lower Leg: Topical Lidocaine 4% cream applied to wound bed prior to debridement  (In Clinic Only). Primary Wound Dressing: Wound #8 Right,Lateral Lower Leg: Silver Collagen Secondary Dressing: Wound #8 Right,Lateral Lower Leg: ABD and Kerlix/Conform Dressing Change Frequency: Wound #8 Right,Lateral Lower Leg: Change dressing every week  Follow-up Appointments: Wound #8 Right,Lateral Lower Leg: Return Appointment in 1 week. Edema Control: Wound #8 Right,Lateral Lower Leg: Elevate legs to the level of the heart and pump ankles as often as possible Other: - tubigrip Home Health: Wound #8 Right,Lateral Lower Leg: Continue Home Health Visits - HOLD patient will appeal discharge per Advanced Letter to patient Home Health Nurse may visit PRN to address patient s wound care needs. FACE TO FACE ENCOUNTER: MEDICARE and MEDICAID PATIENTS: I certify that this patient is under my care and that I had a face-to-face encounter that meets the physician face-to-face encounter requirements with this patient on this date. The encounter with the patient was in whole or in part for the following MEDICAL CONDITION: (primary reason for [place]) MEDICAL NECESSITY: I certify, that based on my findings, NURSING services are a medically necessary home health service. HOME BOUND STATUS: I certify that my clinical findings support that this patient is homebound (i.e., Due to illness or injury, pt requires aid of supportive devices such as crutches, cane, wheelchairs, walkers, the use of special transportation or the assistance of another person to leave their place of residence. There is a normal inability to leave the home and doing so requires considerable and taxing effort. Other absences are for medical reasons / religious services and are infrequent or of short duration when for other reasons). If current dressing causes regression in wound condition, may D/C ordered dressing product/s and apply Normal Saline Moist Dressing daily until next [place] / Other MD  appointment. [place] of regression in wound condition at [phone]. Please direct any NON-WOUND related issues/requests for orders to patient's Primary Care Physician KIELY, COUSAR ([id number]) Medications-please add to medication list.: Wound #8 Right,Lateral Lower Leg: P.O. Antibiotics - Complete antibiotics [name] in a recommend that we continue with the above wound care measures for the next week she's in agreement with plan. If anything changes or worsens the meantime shall contact the office and let me know. Please see above for specific wound care orders. We will see patient for re-evaluation in 1 week(s) here in the clinic. If anything worsens or changes patient will contact our office for additional recommendations. Electronic Signature(s) Signed: 05/29/2018 8:52:26 AM By: Worthy Keeler PA-C Entered By: Worthy Keeler on 05/28/2018 22:59:37 Mcelmurry, [name] ([id number]) -------------------------------------------------------------------------------- ROS/PFSH Details Patient Name: Bernards, Franceska C. Date of Service: 05/28/2018 3:00 PM Medical Record Number: [id number] Patient Account Number: [id number] Date of Birth/Sex: 25-Dec-1927 (85 y.o. F) Treating RN: Harold Barban Primary Care Provider: Ria Bush Other Clinician: Referring Provider: Ria Bush Treating Provider/Extender: Melburn Hake, HOYT Weeks in Treatment: 9 Information Obtained From Patient Constitutional Symptoms (General Health) Complaints and Symptoms: Negative for: Fever; Chills Medical History: Past Medical History Notes: High Blood Pressure, open heart surgery, gall stone, hysterectomy; Fluid pill, Anxiety Eyes Medical History: Positive for: Cataracts - removed Negative for: Glaucoma; Optic Neuritis Hematologic/Lymphatic Medical History: Positive for: Anemia Negative for: Hemophilia; Human Immunodeficiency Virus; Lymphedema; Sickle Cell  Disease Respiratory Complaints and Symptoms: No Complaints or Symptoms Medical History: Negative for: Aspiration; Asthma; Chronic Obstructive Pulmonary Disease (COPD); Pneumothorax; Sleep Apnea; Tuberculosis Cardiovascular Complaints and Symptoms: No Complaints or Symptoms Medical History: Positive for: Arrhythmia; Coronary Artery Disease; Hypertension; Myocardial Infarction Negative for: Angina; Congestive Heart Failure; Deep Vein Thrombosis; Hypotension; Peripheral Arterial Disease; Peripheral Venous Disease; Phlebitis Endocrine Medical History: Positive for: Type II Diabetes Negative for: Type I Diabetes Time with diabetes: no idea Treated with: Oral agents [name],  Sylvan C. ([id number]) Blood sugar tested every day: No Genitourinary Medical History: Negative for: End Stage Renal Disease Immunological Medical History: Negative for: Lupus Erythematosus; Raynaudos; Scleroderma Integumentary (Skin) Medical History: Negative for: History of Burn; History of pressure wounds Musculoskeletal Medical History: Positive for: Osteoarthritis Negative for: Gout; Rheumatoid Arthritis; Osteomyelitis Neurologic Medical History: Negative for: Dementia; Neuropathy; Quadriplegia; Paraplegia; Seizure Disorder Oncologic Medical History: Negative for: Received Chemotherapy; Received Radiation Psychiatric Complaints and Symptoms: No Complaints or Symptoms HBO Extended History Items Eyes: Cataracts Immunizations Pneumococcal Vaccine: Received Pneumococcal Vaccination: Yes Implantable Devices No devices added Family and Social History Cancer: No; Diabetes: Yes - Father,Siblings,Child; Heart Disease: No; Hypertension: Yes - Child; Kidney Disease: No; Lung Disease: Yes - Child; Seizures: No; Stroke: Yes - Mother; Thyroid Problems: No; Former smoker; Marital Status - Single; Alcohol Use: Never; Drug Use: No History; Caffeine Use: Daily; Financial Concerns: No; Food, Clothing or Shelter  Needs: No; Support System Lacking: No; Transportation Concerns: No; Advanced Directives: Yes (Not Provided); Patient does not want information on Advanced Directives; Living Will: Yes (Not Provided) Physician Affirmation I have reviewed and agree with the above information. SHATINA, STREETS ([id number]) Electronic Signature(s) Signed: 05/29/2018 8:52:26 AM By: Worthy Keeler PA-C Signed: 05/29/2018 11:13:25 AM By: Harold Barban Entered By: Worthy Keeler on 05/28/2018 22:57:31 Copher, [name] ([id number]) -------------------------------------------------------------------------------- SuperBill Details Patient Name: Abraha, Carmencita C. Date of Service: 05/28/2018 Medical Record Number: [id number] Patient Account Number: [id number] Date of Birth/Sex: 1928-03-04 (85 y.o. F) Treating RN: Harold Barban Primary Care Provider: Ria Bush Other Clinician: Referring Provider: Ria Bush Treating Provider/Extender: Melburn Hake, HOYT Weeks in Treatment: 9 Diagnosis Coding ICD-10 Codes Code Description I87.331 Chronic venous hypertension (idiopathic) with ulcer and inflammation of right lower extremity E11.622 Type 2 diabetes mellitus with other skin ulcer L97.812 Non-pressure chronic ulcer of other part of right lower leg with fat layer exposed L03.115 Cellulitis of right lower limb Facility Procedures CPT4 Code: [id number] Description: 29518 - WOUND CARE VISIT-LEV 2 EST PT Modifier: Quantity: 1 Physician Procedures CPT4: Description Modifier Quantity Code [id number] - WC PHYS LEVEL 4 - EST PT 1 ICD-10 Diagnosis Description I87.331 Chronic venous hypertension (idiopathic) with ulcer and inflammation of right lower extremity E11.622 Type 2 diabetes mellitus with  other skin ulcer L97.812 Non-pressure chronic ulcer of other part of right lower leg with fat layer exposed L03.115 Cellulitis of right lower limb Electronic Signature(s) Signed: 05/29/2018 8:52:26 AM By: Worthy Keeler PA-C Entered By: Worthy Keeler on 05/28/2018 22:59:52

## 2018-05-31 NOTE — Progress Notes (Signed)
[name] ([id number]) Visit Report for 05/28/2018 Arrival Information Details Patient Name: [name], [name] Date of Service: 05/28/2018 3:00 PM Medical Record Number: [id number] Patient Account Number: [id number] Date of Birth/Sex: [birth year] (85 y.o. F) Treating RN: Secundino Ginger Primary Care Sorayah Schrodt: Ria Bush Other Clinician: Referring Darrnell Mangiaracina: Ria Bush Treating Adalin Vanderploeg/Extender: Melburn Hake, HOYT Weeks in Treatment: 9 Visit Information History Since Last Visit Added or deleted any medications: No Patient Arrived: Walker Any new allergies or adverse reactions: No Arrival Time: 15:20 Had a fall or experienced change in No Accompanied By: self activities of daily living that may affect Transfer Assistance: None risk of falls: Patient Identification Verified: Yes Signs or symptoms of abuse/neglect since last visito No Secondary Verification Process Completed: Yes Hospitalized since last visit: No Implantable device outside of the clinic excluding No cellular tissue based products placed in the center since last visit: Has Dressing in Place as Prescribed: Yes Pain Present Now: No Electronic Signature(s) Signed: 05/28/2018 4:13:49 PM By: Secundino Ginger Entered By: Secundino Ginger on 05/28/2018 15:20:49 [name], [name] ([id number]) -------------------------------------------------------------------------------- Clinic Level of Care Assessment Details Patient Name: [name], [name] Date of Service: 05/28/2018 3:00 PM Medical Record Number: [id number] Patient Account Number: [id number] Date of Birth/Sex: 1927-08-25 (85 y.o. F) Treating RN: Harold Barban Primary Care Layton Naves: Ria Bush Other Clinician: Referring Treylen Gibbs: Ria Bush Treating Arnitra Sokoloski/Extender: Melburn Hake, HOYT Weeks in Treatment: 9 Clinic Level of Care Assessment Items TOOL 4 Quantity Score []  - Use when only an EandM is performed on FOLLOW-UP visit 0 ASSESSMENTS - Nursing  Assessment / Reassessment X - Reassessment of Co-morbidities (includes updates in patient status) 1 10 X- 1 5 Reassessment of Adherence to Treatment Plan ASSESSMENTS - Wound and Skin Assessment / Reassessment X - Simple Wound Assessment / Reassessment - one wound 1 5 []  - 0 Complex Wound Assessment / Reassessment - multiple wounds []  - 0 Dermatologic / Skin Assessment (not related to wound area) ASSESSMENTS - Focused Assessment []  - Circumferential Edema Measurements - multi extremities 0 []  - 0 Nutritional Assessment / Counseling / Intervention []  - 0 Lower Extremity Assessment (monofilament, tuning fork, pulses) []  - 0 Peripheral Arterial Disease Assessment (using hand held doppler) ASSESSMENTS - Ostomy and/or Continence Assessment and Care []  - Incontinence Assessment and Management 0 []  - 0 Ostomy Care Assessment and Management (repouching, etc.) PROCESS - Coordination of Care X - Simple Patient / Family Education for ongoing care 1 15 []  - 0 Complex (extensive) Patient / Family Education for ongoing care []  - 0 Staff obtains Programmer, systems, Records, Test Results / Process Orders []  - 0 Staff telephones HHA, Nursing Homes / Clarify orders / etc []  - 0 Routine Transfer to another Facility (non-emergent condition) []  - 0 Routine Hospital Admission (non-emergent condition) []  - 0 New Admissions / Biomedical engineer / Ordering NPWT, Apligraf, etc. []  - 0 Emergency Hospital Admission (emergent condition) X- 1 10 Simple Discharge Coordination [name], [name]. ([id number]) []  - 0 Complex (extensive) Discharge Coordination PROCESS - Special Needs []  - Pediatric / Minor Patient Management 0 []  - 0 Isolation Patient Management []  - 0 Hearing / Language / Visual special needs []  - 0 Assessment of Community assistance (transportation, D/[name] planning, etc.) []  - 0 Additional assistance / Altered mentation []  - 0 Support Surface(s) Assessment (bed, cushion, seat,  etc.) INTERVENTIONS - Wound Cleansing / Measurement X - Simple Wound Cleansing - one wound 1 5 []  - 0 Complex Wound Cleansing - multiple wounds X- 1 5 Wound Imaging (  photographs - any number of wounds) []  - 0 Wound Tracing (instead of photographs) X- 1 5 Simple Wound Measurement - one wound []  - 0 Complex Wound Measurement - multiple wounds INTERVENTIONS - Wound Dressings X - Small Wound Dressing one or multiple wounds 1 10 []  - 0 Medium Wound Dressing one or multiple wounds []  - 0 Large Wound Dressing one or multiple wounds []  - 0 Application of Medications - topical []  - 0 Application of Medications - injection INTERVENTIONS - Miscellaneous []  - External ear exam 0 []  - 0 Specimen Collection (cultures, biopsies, blood, body fluids, etc.) []  - 0 Specimen(s) / Culture(s) sent or taken to Lab for analysis []  - 0 Patient Transfer (multiple staff / Civil Service fast streamer / Similar devices) []  - 0 Simple Staple / Suture removal (25 or less) []  - 0 Complex Staple / Suture removal (26 or more) []  - 0 Hypo / Hyperglycemic Management (close monitor of Blood Glucose) []  - 0 Ankle / Brachial Index (ABI) - do not check if billed separately X- 1 5 Vital Signs [name], [name] ([id number]) Has the patient been seen at the hospital within the last three years: Yes Total Score: 75 Level Of Care: New/Established - Level 2 Electronic Signature(s) Signed: 05/29/2018 11:13:25 AM By: Harold Barban Entered By: Harold Barban on 05/28/2018 16:01:19 [name], [name] ([id number]) -------------------------------------------------------------------------------- Encounter Discharge Information Details Patient Name: [name], [name] Date of Service: 05/28/2018 3:00 PM Medical Record Number: [id number] Patient Account Number: [id number] Date of Birth/Sex: 01-10-1928 (85 y.o. F) Treating RN: Army Melia Primary Care Jahree Dermody: Ria Bush Other Clinician: Referring Aaliyah Cancro: Ria Bush Treating Charlean Carneal/Extender: Melburn Hake, HOYT Weeks in Treatment: 9 Encounter Discharge Information Items Discharge Condition: Stable Ambulatory Status: Walker Discharge Destination: Home Transportation: Private Auto Accompanied By: self Schedule Follow-up Appointment: Yes Clinical Summary of Care: Electronic Signature(s) Signed: 05/30/2018 9:17:40 AM By: Army Melia Entered By: Army Melia on 05/28/2018 16:15:06 Alamillo, [name] ([id number]) -------------------------------------------------------------------------------- Lower Extremity Assessment Details Patient Name: [name], [name] Date of Service: 05/28/2018 3:00 PM Medical Record Number: [id number] Patient Account Number: [id number] Date of Birth/Sex: 09-12-1927 (85 y.o. F) Treating RN: Secundino Ginger Primary Care Malayah Demuro: Ria Bush Other Clinician: Referring Demonica Farrey: Ria Bush Treating Ashlyne Olenick/Extender: Melburn Hake, HOYT Weeks in Treatment: 9 Edema Assessment Assessed: [Left: No] [Right: No] [Left: Edema] [Right: :] Calf Left: Right: Point of Measurement: 30 cm From Medial Instep cm 28 cm Ankle Left: Right: Point of Measurement: 12 cm From Medial Instep cm 19.5 cm Vascular Assessment Claudication: Claudication Assessment [Right:None] Pulses: Dorsalis Pedis Palpable: [Right:Yes] Posterior Tibial Extremity colors, hair growth, and conditions: Extremity Color: [Right:Normal] Hair Growth on Extremity: [Right:No] Temperature of Extremity: [Right:Warm] Capillary Refill: [Right:< 3 seconds] Toe Nail Assessment Left: Right: Thick: No Discolored: No Deformed: No Improper Length and Hygiene: No Electronic Signature(s) Signed: 05/28/2018 4:13:49 PM By: Secundino Ginger Entered By: Secundino Ginger on 05/28/2018 15:32:43 [name], [name] ([id number]) -------------------------------------------------------------------------------- Multi Wound Chart Details Patient Name: Zemaitis, Arayla [name]. Date of Service:  05/28/2018 3:00 PM Medical Record Number: [id number] Patient Account Number: [id number] Date of Birth/Sex: 09/30/1927 (85 y.o. 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] [N/A:N/A] Comorbid History: [8:Cataracts, Anemia, Arrhythmia, Coronary Artery Disease, Hypertension, Myocardial Infarction, Type II Diabetes, Osteoarthritis] [N/A:N/A] Date Acquired: [8:03/09/2018] [N/A:N/A] Weeks of Treatment: [8:9] [N/A:N/A] Wound Status: [8:Open] [N/A:N/A] Measurements L x W x D [8:4x1.5x0.2] [N/A:N/A] (cm) Area (cm) : [8:4.712] [N/A:N/A] Volume (cm) : [8:0.942] [N/A:N/A] % Reduction in Area: [8:-150.00%] [N/A:N/A] % Reduction in Volume: [8:-66.70%] [N/A:N/A] Classification: [8:Grade 2] [N/A:N/A] Exudate Amount: [8:Medium] [N/A:N/A] Exudate Type: [8:Purulent] [N/A:N/A] Exudate Color: [8:yellow, brown, green] [N/A:N/A] Wound Margin: [8:Flat and Intact] [N/A:N/A] Granulation Amount: [8:Small (1-33%)] [N/A:N/A] Granulation Quality: [8:Red] [N/A:N/A] Necrotic Amount: [8:Large (67-100%)] [N/A:N/A] Exposed Structures: [8:Fat Layer (Subcutaneous Tissue) Exposed: Yes Fascia: No Tendon: No Muscle: No Joint: No Bone: No] [N/A:N/A] Epithelialization: [8:None] [N/A:N/A] Periwound Skin Texture: [8:Excoriation: Yes Induration: No] [N/A:N/A] Callus: No Crepitus: No Rash: No Scarring: No Periwound Skin Moisture: Dry/Scaly: Yes N/A N/A Maceration: No Periwound Skin Color: Erythema: Yes N/A  N/A Atrophie Blanche: No Cyanosis: No Ecchymosis: No Hemosiderin Staining: No Mottled: No Pallor: No Rubor: No Erythema Location: Circumferential N/A N/A Temperature: No Abnormality N/A N/A Tenderness on Palpation: Yes N/A N/A Wound Preparation: Ulcer Cleansing: N/A N/A Rinsed/Irrigated with Saline Topical Anesthetic Applied: Other: lidocaine 4% Treatment Notes Electronic Signature(s) Signed: 05/29/2018 11:13:25 AM By: Harold Barban Entered By: Harold Barban on 05/28/2018 16:00:00 [name], [name] ([id number]) -------------------------------------------------------------------------------- [place] Details Patient Name: [name], [name] Date of Service: 05/28/2018 3:00 PM Medical Record Number: [id number] Patient Account Number: [id number] Date of Birth/Sex: 1928-03-16 (85 y.o. F) Treating RN: Harold Barban Primary Care Haitham Dolinsky: Ria Bush Other Clinician: Referring Roda Lauture: Ria Bush Treating Janey Petron/Extender: Melburn Hake, HOYT Weeks in Treatment: 9 Active Inactive Abuse / Safety / Falls / Self Care Management Nursing Diagnoses: Potential for falls Goals: Patient will not experience any injury related to falls Date Initiated: 03/23/2018 Target Resolution Date: 06/09/2018 Goal Status: Active Interventions: Assess fall risk on admission and as needed Notes: Orientation to the Wound Care Program Nursing Diagnoses: Knowledge deficit related to the wound healing center program Goals: Patient/caregiver will verbalize understanding of the [place] Program Date Initiated: 03/23/2018 Target Resolution Date: 06/09/2018 Goal Status: Active Interventions: Provide education on orientation to the wound center Notes: Pain, Acute or Chronic Nursing Diagnoses: Pain, acute or chronic: actual or potential Goals: Patient will verbalize adequate pain control and receive pain control interventions during procedures as needed Date  Initiated: 03/23/2018 Target Resolution Date: 06/09/2018 Goal Status: Active Interventions: Assess comfort goal upon admission [name], [name] ([id number]) Notes: Wound/Skin Impairment Nursing Diagnoses: Impaired tissue integrity Goals: Ulcer/skin breakdown will heal within 14 weeks Date Initiated: 03/23/2018 Target Resolution Date: 06/09/2018 Goal Status: Active Interventions: Assess patient/caregiver ability to obtain necessary supplies Assess patient/caregiver ability to perform ulcer/skin care regimen upon admission and as needed Assess ulceration(s) every visit Notes: Electronic Signature(s) Signed: 05/29/2018 11:13:25 AM By: Harold Barban Entered By: Harold Barban on 05/28/2018 15:59:31 [name], [name] ([id number]) -------------------------------------------------------------------------------- Pain Assessment Details Patient Name: [name], [name] Date of Service: 05/28/2018 3:00 PM Medical Record Number: [id number] Patient Account Number: [id number] Date of Birth/Sex: [birth year] (85 y.o. F) Treating RN: Secundino Ginger Primary Care Richetta Cubillos: Ria Bush Other Clinician: Referring Kyleeann Cremeans: Ria Bush Treating Ronne Savoia/Extender: Melburn Hake, HOYT Weeks in Treatment: 9 Active Problems Location of Pain Severity and Description of Pain Patient Has [name] No Site Locations Pain Management and Medication Current Pain Management: Notes pt denies any pain to wound site. Electronic Signature(s) Signed: 05/28/2018 4:13:49 PM By: Secundino Ginger Entered By: Secundino Ginger on 05/28/2018 15:21:28 [name], [name] ([id number]) --------------------------------------------------------------------------------  Patient/Caregiver Education Details Patient Name: Lantis, Soumya [name]. Date of Service: 05/28/2018 3:00 PM Medical Record Number: [id number] Patient Account Number: [id number] Date of Birth/Gender: 1928-03-23 (85 y.o. F) Treating RN: Harold Barban Primary Care Physician:  Ria Bush Other Clinician: Referring Physician: Ria Bush Treating Physician/Extender: Sharalyn Ink in Treatment: 9 Education Assessment Education Provided To: Patient Education Topics Provided Wound/Skin Impairment: Handouts: Caring for Your Ulcer Methods: Demonstration, Explain/Verbal Responses: State content correctly Electronic Signature(s) Signed: 05/29/2018 11:13:25 AM By: Harold Barban Entered By: Harold Barban on 05/28/2018 16:00:22 [place], Claire Loletha Grayer ([id number]) -------------------------------------------------------------------------------- Wound Assessment Details Patient Name: Jillson, Emmalene [name]. Date of Service: 05/28/2018 3:00 PM Medical Record Number: [id number] Patient Account Number: [id number] Date of Birth/Sex: [birth year] (86 y.o. F) Treating RN: Secundino Ginger Primary Care Recie Cirrincione: Ria Bush Other Clinician: Referring Maxmillian Carsey: Ria Bush Treating Jamari Diana/Extender: STONE III, HOYT Weeks in Treatment: 9 Wound Status Wound Number: 8 Primary Diabetic Wound/Ulcer of the Lower Extremity Etiology: Wound Location: Right Lower Leg - Lateral Wound Open Wounding Event: Trauma Status: Date Acquired: 03/09/2018 Comorbid Cataracts, Anemia, Arrhythmia, Coronary Artery Weeks Of Treatment: 9 History: Disease, Hypertension, Myocardial Infarction, Clustered Wound: No Type II Diabetes, Osteoarthritis Wound Measurements Length: (cm) 4 Width: (cm) 1.5 Depth: (cm) 0.2 Area: (cm) 4.712 Volume: (cm) 0.942 % Reduction in Area: -150% % Reduction in Volume: -66.7% Epithelialization: None Tunneling: No Undermining: No Wound Description Classification: Grade 2 Foul Od Wound Margin: Flat and Intact Slough/ Exudate Amount: Medium Exudate Type: Purulent Exudate Color: yellow, brown, green or After Cleansing: No Fibrino Yes Wound Bed Granulation Amount: Small (1-33%) Exposed Structure Granulation Quality: Red Fascia Exposed:  No Necrotic Amount: Large (67-100%) Fat Layer (Subcutaneous Tissue) Exposed: Yes Necrotic Quality: Adherent Slough Tendon Exposed: No Muscle Exposed: No Joint Exposed: No Bone Exposed: No Periwound Skin Texture Texture Color No Abnormalities Noted: No No Abnormalities Noted: No Callus: No Atrophie Blanche: No Crepitus: No Cyanosis: No Excoriation: Yes Ecchymosis: No Induration: No Erythema: Yes Rash: No Erythema Location: Circumferential Scarring: No Hemosiderin Staining: No Mottled: No Moisture Pallor: No No Abnormalities Noted: No Rubor: No Dry / Scaly: Yes Maceration: No Temperature / Pain Delancy, Jovonna [name]. ([id number]) Temperature: No Abnormality Tenderness on Palpation: Yes Wound Preparation Ulcer Cleansing: Rinsed/Irrigated with Saline Topical Anesthetic Applied: Other: lidocaine 4%, Treatment Notes Wound #8 (Right, Lateral Lower Leg) Notes Silver Ag, secured with conform, tubigrip Electronic Signature(s) Signed: 05/28/2018 4:13:49 PM By: Secundino Ginger Entered By: Secundino Ginger on 05/28/2018 15:31:10 Micucci, [name] ([id number]) -------------------------------------------------------------------------------- Vitals Details Patient Name: Kings, Kyliah [name]. Date of Service: 05/28/2018 3:00 PM Medical Record Number: [id number] Patient Account Number: [id number] Date of Birth/Sex: [birth year] (85 y.o. F) Treating RN: Secundino Ginger Primary Care Braxtyn Bojarski: Ria Bush Other Clinician: Referring Saige Busby: Ria Bush Treating Ellin Fitzgibbons/Extender: STONE III, HOYT Weeks in Treatment: 9 Vital Signs Time Taken: 15:25 Temperature (F): 97.9 Height (in): 58 Pulse (bpm): 84 Weight (lbs): 140 Respiratory Rate (breaths/min): 18 Body Mass Index (BMI): 29.3 Blood Pressure (mmHg): 137/61 Reference Range: 80 - 120 mg / dl Electronic Signature(s) Signed: 05/28/2018 4:13:49 PM By: Secundino Ginger Entered BySecundino Ginger on 05/28/2018 15:25:08

## 2018-06-04 ENCOUNTER — Encounter: Payer: Medicare Other | Attending: Family Medicine | Admitting: Family Medicine

## 2018-06-04 DIAGNOSIS — M1990 Unspecified osteoarthritis, unspecified site: Secondary | ICD-10-CM | POA: Insufficient documentation

## 2018-06-04 DIAGNOSIS — L03115 Cellulitis of right lower limb: Secondary | ICD-10-CM | POA: Insufficient documentation

## 2018-06-04 DIAGNOSIS — I252 Old myocardial infarction: Secondary | ICD-10-CM | POA: Diagnosis not present

## 2018-06-04 DIAGNOSIS — I87331 Chronic venous hypertension (idiopathic) with ulcer and inflammation of right lower extremity: Secondary | ICD-10-CM | POA: Diagnosis not present

## 2018-06-04 DIAGNOSIS — E11622 Type 2 diabetes mellitus with other skin ulcer: Secondary | ICD-10-CM | POA: Diagnosis not present

## 2018-06-04 DIAGNOSIS — L97812 Non-pressure chronic ulcer of other part of right lower leg with fat layer exposed: Secondary | ICD-10-CM | POA: Diagnosis not present

## 2018-06-05 NOTE — Progress Notes (Addendum)
TAWANA, PASCH ([id number]) Visit Report for 06/04/2018 Arrival Information Details Patient Name: Round, Marg C. Date of Service: 06/04/2018 2:00 PM Medical Record Number: [id number] Patient Account Number: [id number] Date of Birth/Sex: Mar 08, 1928 (85 y.o. F) Treating RN: Army Melia Primary Care Fatumata Kashani: Ria Bush Other Clinician: Referring Morrie Daywalt: Ria Bush Treating Wallis Spizzirri/Extender: Oneida Arenas in Treatment: 10 Visit Information History Since Last Visit Added or deleted any medications: No Patient Arrived: Walker Any new allergies or adverse reactions: No Arrival Time: 14:09 Had a fall or experienced change in No Accompanied By: friend activities of daily living that may affect Transfer Assistance: None risk of falls: Signs or symptoms of abuse/neglect since last visito No Hospitalized since last visit: No Pain Present Now: Yes Electronic Signature(s) Signed: 06/04/2018 2:34:20 PM By: Army Melia Entered By: Army Melia on 06/04/2018 14:09:29 [place], Bev Loletha Grayer ([id number]) -------------------------------------------------------------------------------- Clinic Level of Care Assessment Details Patient Name: Panther, Courteney C. Date of Service: 06/04/2018 2:00 PM Medical Record Number: [id number] Patient Account Number: [id number] Date of Birth/Sex: 16-Aug-1927 (85 y.o. F) Treating RN: Harold Barban Primary Care Jaquane Boughner: Ria Bush Other Clinician: Referring Elen Acero: Ria Bush Treating Bronsyn Shappell/Extender: Oneida Arenas in Treatment: 10 Clinic Level of Care Assessment Items TOOL 4 Quantity Score []  - Use when only an EandM is performed on FOLLOW-UP visit 0 ASSESSMENTS - Nursing Assessment / Reassessment X - Reassessment of Co-morbidities (includes updates in patient status) 1 10 X- 1 5 Reassessment of Adherence to Treatment Plan ASSESSMENTS - Wound and Skin Assessment / Reassessment X - Simple Wound Assessment /  Reassessment - one wound 1 5 []  - 0 Complex Wound Assessment / Reassessment - multiple wounds []  - 0 Dermatologic / Skin Assessment (not related to wound area) ASSESSMENTS - Focused Assessment []  - Circumferential Edema Measurements - multi extremities 0 []  - 0 Nutritional Assessment / Counseling / Intervention []  - 0 Lower Extremity Assessment (monofilament, tuning fork, pulses) []  - 0 Peripheral Arterial Disease Assessment (using hand held doppler) ASSESSMENTS - Ostomy and/or Continence Assessment and Care []  - Incontinence Assessment and Management 0 []  - 0 Ostomy Care Assessment and Management (repouching, etc.) PROCESS - Coordination of Care X - Simple Patient / Family Education for ongoing care 1 15 []  - 0 Complex (extensive) Patient / Family Education for ongoing care []  - 0 Staff obtains Programmer, systems, Records, Test Results / Process Orders []  - 0 Staff telephones HHA, Nursing Homes / Clarify orders / etc []  - 0 Routine Transfer to another Facility (non-emergent condition) []  - 0 Routine Hospital Admission (non-emergent condition) []  - 0 New Admissions / Biomedical engineer / Ordering NPWT, Apligraf, etc. []  - 0 Emergency Hospital Admission (emergent condition) X- 1 10 Simple Discharge Coordination Gaunce, Tequisha C. ([id number]) []  - 0 Complex (extensive) Discharge Coordination PROCESS - Special Needs []  - Pediatric / Minor Patient Management 0 []  - 0 Isolation Patient Management []  - 0 Hearing / Language / Visual special needs []  - 0 Assessment of Community assistance (transportation, D/C planning, etc.) []  - 0 Additional assistance / Altered mentation []  - 0 Support Surface(s) Assessment (bed, cushion, seat, etc.) INTERVENTIONS - Wound Cleansing / Measurement X - Simple Wound Cleansing - one wound 1 5 []  - 0 Complex Wound Cleansing - multiple wounds X- 1 5 Wound Imaging (photographs - any number of wounds) []  - 0 Wound Tracing (instead of  photographs) X- 1 5 Simple Wound Measurement - one wound []  - 0 Complex Wound Measurement - multiple wounds INTERVENTIONS - Wound Dressings X -  Small Wound Dressing one or multiple wounds 1 10 []  - 0 Medium Wound Dressing one or multiple wounds []  - 0 Large Wound Dressing one or multiple wounds []  - 0 Application of Medications - topical []  - 0 Application of Medications - injection INTERVENTIONS - Miscellaneous []  - External ear exam 0 []  - 0 Specimen Collection (cultures, biopsies, blood, body fluids, etc.) []  - 0 Specimen(s) / Culture(s) sent or taken to Lab for analysis []  - 0 Patient Transfer (multiple staff / Civil Service fast streamer / Similar devices) []  - 0 Simple Staple / Suture removal (25 or less) []  - 0 Complex Staple / Suture removal (26 or more) []  - 0 Hypo / Hyperglycemic Management (close monitor of Blood Glucose) []  - 0 Ankle / Brachial Index (ABI) - do not check if billed separately X- 1 5 Vital Signs Kilian, Casmira C. ([id number]) Has the patient been seen at the hospital within the last three years: Yes Total Score: 75 Level Of Care: New/Established - Level 2 Electronic Signature(s) Signed: 06/04/2018 2:48:33 PM By: Harold Barban Entered By: Harold Barban on 06/04/2018 14:37:21 Ballinas, [name] ([id number]) -------------------------------------------------------------------------------- Encounter Discharge Information Details Patient Name: Nienow, Talyn C. Date of Service: 06/04/2018 2:00 PM Medical Record Number: [id number] Patient Account Number: [id number] Date of Birth/Sex: [birth year] (85 y.o. F) Treating RN: Cornell Barman Primary Care Wade Sigala: Ria Bush Other Clinician: Referring Benicia Bergevin: Ria Bush Treating Niaomi Cartaya/Extender: Oneida Arenas in Treatment: 10 Encounter Discharge Information Items Discharge Condition: Stable Ambulatory Status: Walker Discharge Destination: Home Transportation: Private Auto Accompanied By:  self Schedule Follow-up Appointment: Yes Clinical Summary of Care: Electronic Signature(s) Signed: 06/04/2018 4:00:49 PM By: Gretta Cool, BSN, RN, CWS, Kim RN, BSN Entered By: Gretta Cool, BSN, RN, CWS, Kim on 06/04/2018 14:53:03 Niccoli, [name] ([id number]) -------------------------------------------------------------------------------- Lower Extremity Assessment Details Patient Name: Mineo, Arina C. Date of Service: 06/04/2018 2:00 PM Medical Record Number: [id number] Patient Account Number: [id number] Date of Birth/Sex: 1927-05-06 (85 y.o. F) Treating RN: Army Melia Primary Care Mills Mitton: Ria Bush Other Clinician: Referring Grazia Taffe: Ria Bush Treating Kennidi Yoshida/Extender: Beather Arbour Weeks in Treatment: 10 Edema Assessment Assessed: [Left: No] [Right: No] Edema: [Left: N] [Right: o] Calf Left: Right: Point of Measurement: 30 cm From Medial Instep cm 26.8 cm Ankle Left: Right: Point of Measurement: 12 cm From Medial Instep cm 19 cm Vascular Assessment Pulses: Dorsalis Pedis Palpable: [Right:Yes] Posterior Tibial Extremity colors, hair growth, and conditions: Extremity Color: [Right:Red] Hair Growth on Extremity: [Right:No] Temperature of Extremity: [Right:Warm] Capillary Refill: [Right:< 3 seconds] Toe Nail Assessment Left: Right: Thick: No Discolored: No Deformed: No Improper Length and Hygiene: No Electronic Signature(s) Signed: 06/04/2018 2:34:20 PM By: Army Melia Entered By: Army Melia on 06/04/2018 14:18:04 Kirker, Morton ([id number]) -------------------------------------------------------------------------------- Multi Wound Chart Details Patient Name: Mcbryar, Janifer C. Date of Service: 06/04/2018 2:00 PM Medical Record Number: [id number] Patient Account Number: [id number] Date of Birth/Sex: [birth year] (85 y.o. F) Treating RN: Harold Barban Primary Care Zaylon Bossier: Ria Bush Other Clinician: Referring Kysha Muralles: Ria Bush Treating Venera Privott/Extender: Oneida Arenas in Treatment: 10 Vital Signs Height(in): 58 Pulse(bpm): 80 Weight(lbs): 140 Blood Pressure(mmHg): 126/73 Body Mass Index(BMI): 29 Temperature(F): 97.7 Respiratory Rate 16 (breaths/min): Photos: [N/A:N/A] Wound Location: Right Lower Leg - Lateral N/A N/A Wounding Event: Trauma N/A N/A Primary Etiology: Diabetic Wound/Ulcer of the N/A N/A Lower Extremity Comorbid History: Cataracts, Anemia, N/A N/A Arrhythmia, Coronary Artery Disease, Hypertension, Myocardial Infarction, Type II Diabetes, Osteoarthritis Date Acquired: 03/09/2018 N/A N/A Weeks of Treatment: 10 N/A N/A Wound Status: Open N/A N/A  Measurements L x W x D 3.7x1.3x0.1 N/A N/A (cm) Area (cm) : 3.778 N/A N/A Volume (cm) : 0.378 N/A N/A % Reduction in Area: -100.40% N/A N/A % Reduction in Volume: 33.10% N/A N/A Classification: Grade 2 N/A N/A Exudate Amount: Medium N/A N/A Exudate Type: Purulent N/A N/A Exudate Color: yellow, brown, green N/A N/A Wound Margin: Flat and Intact N/A N/A Granulation Amount: Small (1-33%) N/A N/A Granulation Quality: Red N/A N/A Necrotic Amount: Large (67-100%) N/A N/A Necrotic Tissue: Eschar, Adherent Slough N/A N/A Exposed Structures: Fat Layer (Subcutaneous N/A N/A Tissue) Exposed: Yes Meyering, Dorthie C. ([id number]) Fascia: No Tendon: No Muscle: No Joint: No Bone: No Epithelialization: None N/A N/A Periwound Skin Texture: Excoriation: Yes N/A N/A Induration: No Callus: No Crepitus: No Rash: No Scarring: No Periwound Skin Moisture: Dry/Scaly: Yes N/A N/A Maceration: No Periwound Skin Color: Erythema: Yes N/A N/A Atrophie Blanche: No Cyanosis: No Ecchymosis: No Hemosiderin Staining: No Mottled: No Pallor: No Rubor: No Erythema Location: Circumferential N/A N/A Temperature: No Abnormality N/A N/A Tenderness on Palpation: Yes N/A N/A Wound Preparation: Ulcer Cleansing: N/A N/A Rinsed/Irrigated with  Saline Topical Anesthetic Applied: Other: lidocaine 4% Treatment Notes Electronic Signature(s) Signed: 06/04/2018 2:48:33 PM By: Harold Barban Entered By: Harold Barban on 06/04/2018 14:35:48 Sabas, [name] ([id number]) -------------------------------------------------------------------------------- [place] Details Patient Name: Raineri, Anjeanette C. Date of Service: 06/04/2018 2:00 PM Medical Record Number: [id number] Patient Account Number: [id number] Date of Birth/Sex: 1927-11-06 (85 y.o. F) Treating RN: Harold Barban Primary Care Chatham Howington: Ria Bush Other Clinician: Referring Amontae Ng: Ria Bush Treating Acen Craun/Extender: Oneida Arenas in Treatment: 10 Active Inactive Abuse / Safety / Falls / Self Care Management Nursing Diagnoses: Potential for falls Goals: Patient will not experience any injury related to falls Date Initiated: 03/23/2018 Target Resolution Date: 06/09/2018 Goal Status: Active Interventions: Assess fall risk on admission and as needed Notes: Orientation to the Wound Care Program Nursing Diagnoses: Knowledge deficit related to the wound healing center program Goals: Patient/caregiver will verbalize understanding of the [place] Program Date Initiated: 03/23/2018 Target Resolution Date: 06/09/2018 Goal Status: Active Interventions: Provide education on orientation to the wound center Notes: Pain, Acute or Chronic Nursing Diagnoses: Pain, acute or chronic: actual or potential Goals: Patient will verbalize adequate pain control and receive pain control interventions during procedures as needed Date Initiated: 03/23/2018 Target Resolution Date: 06/09/2018 Goal Status: Active Interventions: Assess comfort goal upon admission Avedisian, Jasmain C. ([id number]) Notes: Wound/Skin Impairment Nursing Diagnoses: Impaired tissue integrity Goals: Ulcer/skin breakdown will heal within 14 weeks Date  Initiated: 03/23/2018 Target Resolution Date: 06/09/2018 Goal Status: Active Interventions: Assess patient/caregiver ability to obtain necessary supplies Assess patient/caregiver ability to perform ulcer/skin care regimen upon admission and as needed Assess ulceration(s) every visit Notes: Electronic Signature(s) Signed: 06/04/2018 2:48:33 PM By: Harold Barban Entered By: Harold Barban on 06/04/2018 14:35:38 Glander, Nini C. ([id number]) -------------------------------------------------------------------------------- Pain Assessment Details Patient Name: Defilippo, Celisa C. Date of Service: 06/04/2018 2:00 PM Medical Record Number: [id number] Patient Account Number: [id number] Date of Birth/Sex: Jun 26, 1927 (85 y.o. F) Treating RN: Army Melia Primary Care Giana Castner: Ria Bush Other Clinician: Referring Aaronjames Kelsay: Ria Bush Treating Eliyanna Ault/Extender: Oneida Arenas in Treatment: 10 Active Problems Location of Pain Severity and Description of Pain Patient Has [name] Yes Site Locations Pain Location: Pain in Ulcers With Dressing Change: Yes Rate the pain. Current Pain Level: 5 Pain Management and Medication Current Pain Management: Electronic Signature(s) Signed: 06/04/2018 2:34:20 PM By: Army Melia Entered By: Army Melia on 06/04/2018 14:09:48 Vanwinkle, Sheela  Loletha Grayer ([id number]) -------------------------------------------------------------------------------- Patient/Caregiver Education Details Patient Name: Holmes, [name] Date of Service: 06/04/2018 2:00 PM Medical Record Number: [id number] Patient Account Number: [id number] Date of Birth/Gender: 01/27/1928 (85 y.o. F) Treating RN: Harold Barban Primary Care Physician: Ria Bush Other Clinician: Referring Physician: Ria Bush Treating Physician/Extender: Oneida Arenas in Treatment: 10 Education Assessment Education Provided To: Patient Education Topics Provided Wound/Skin  Impairment: Handouts: Caring for Your Ulcer Methods: Demonstration, Explain/Verbal Responses: State content correctly Electronic Signature(s) Signed: 06/04/2018 2:48:33 PM By: Harold Barban Entered By: Harold Barban on 06/04/2018 14:36:06 Buxton, Cena C. ([id number]) -------------------------------------------------------------------------------- Wound Assessment Details Patient Name: Johnston, Matisyn C. Date of Service: 06/04/2018 2:00 PM Medical Record Number: [id number] Patient Account Number: [id number] Date of Birth/Sex: 05-01-1927 (85 y.o. F) Treating RN: Army Melia Primary Care Christena Sunderlin: Ria Bush Other Clinician: Referring Bahja Bence: Ria Bush Treating Sueko Dimichele/Extender: Beather Arbour Weeks in Treatment: 10 Wound Status Wound Number: 8 Primary Diabetic Wound/Ulcer of the Lower Extremity Etiology: Wound Location: Right Lower Leg - Lateral Wound Open Wounding Event: Trauma Status: Date Acquired: 03/09/2018 Comorbid Cataracts, Anemia, Arrhythmia, Coronary Artery Weeks Of Treatment: 10 History: Disease, Hypertension, Myocardial Infarction, Clustered Wound: No Type II Diabetes, Osteoarthritis Photos Photo Uploaded By: Army Melia on 06/04/2018 14:31:55 Wound Measurements Length: (cm) 3.7 Width: (cm) 1.3 Depth: (cm) 0.1 Area: (cm) 3.778 Volume: (cm) 0.378 % Reduction in Area: -100.4% % Reduction in Volume: 33.1% Epithelialization: None Tunneling: No Undermining: No Wound Description Classification: Grade 2 Wound Margin: Flat and Intact Exudate Amount: Medium Exudate Type: Purulent Exudate Color: yellow, brown, green Foul Odor After Cleansing: No Slough/Fibrino Yes Wound Bed Granulation Amount: Small (1-33%) Exposed Structure Granulation Quality: Red Fascia Exposed: No Necrotic Amount: Large (67-100%) Fat Layer (Subcutaneous Tissue) Exposed: Yes Necrotic Quality: Eschar, Adherent Slough Tendon Exposed: No Muscle Exposed: No Joint  Exposed: No Bone Exposed: No Periwound Skin Texture Kerth, Johara C. ([id number]) Texture Color No Abnormalities Noted: No No Abnormalities Noted: No Callus: No Atrophie Blanche: No Crepitus: No Cyanosis: No Excoriation: Yes Ecchymosis: No Induration: No Erythema: Yes Rash: No Erythema Location: Circumferential Scarring: No Hemosiderin Staining: No Mottled: No Moisture Pallor: No No Abnormalities Noted: No Rubor: No Dry / Scaly: Yes Maceration: No Temperature / Pain Temperature: No Abnormality Tenderness on Palpation: Yes Wound Preparation Ulcer Cleansing: Rinsed/Irrigated with Saline Topical Anesthetic Applied: Other: lidocaine 4%, Treatment Notes Wound #8 (Right, Lateral Lower Leg) Notes Silver Ag, ABD, secured with conform, tubigrip Electronic Signature(s) Signed: 06/04/2018 2:34:20 PM By: Army Melia Entered By: Army Melia on 06/04/2018 14:14:54 Sopher, [name] ([id number]) -------------------------------------------------------------------------------- Vitals Details Patient Name: Geddis, Zaleah C. Date of Service: 06/04/2018 2:00 PM Medical Record Number: [id number] Patient Account Number: [id number] Date of Birth/Sex: 03-Apr-1928 (85 y.o. F) Treating RN: Army Melia Primary Care Winda Summerall: Ria Bush Other Clinician: Referring Ranger Petrich: Ria Bush Treating Ross Bender/Extender: Oneida Arenas in Treatment: 10 Vital Signs Time Taken: 14:02 Temperature (F): 97.7 Height (in): 58 Pulse (bpm): 80 Weight (lbs): 140 Respiratory Rate (breaths/min): 16 Body Mass Index (BMI): 29.3 Blood Pressure (mmHg): 126/73 Reference Range: 80 - 120 mg / dl Electronic Signature(s) Signed: 06/04/2018 2:34:20 PM By: Army Melia Entered By: Army Melia on 06/04/2018 14:10:29

## 2018-06-06 ENCOUNTER — Other Ambulatory Visit (INDEPENDENT_AMBULATORY_CARE_PROVIDER_SITE_OTHER): Payer: Self-pay | Admitting: Physician Assistant

## 2018-06-06 ENCOUNTER — Ambulatory Visit (INDEPENDENT_AMBULATORY_CARE_PROVIDER_SITE_OTHER): Payer: Medicare Other

## 2018-06-06 DIAGNOSIS — S81809A Unspecified open wound, unspecified lower leg, initial encounter: Secondary | ICD-10-CM

## 2018-06-06 DIAGNOSIS — I739 Peripheral vascular disease, unspecified: Secondary | ICD-10-CM | POA: Diagnosis not present

## 2018-06-10 NOTE — Progress Notes (Signed)
JADESOLA, POYNTER ([id number]) Visit Report for 06/04/2018 Chief Complaint Document Details Patient Name: [name], [name] Date of Service: 06/04/2018 2:00 PM Medical Record Number: [id number] Patient Account Number: [id number] Date of Birth/Sex: November 01, 1927 (85 y.o. F) Treating RN: Harold Barban Primary Care Provider: Ria Bush Other Clinician: Referring Provider: Ria Bush Treating Provider/Extender: Oneida Arenas in Treatment: 10 Information Obtained from: Patient Chief Complaint RLE wounds Electronic Signature(s) Signed: 06/05/2018 11:59:39 PM By: Beather Arbour FNP-C Entered By: Beather Arbour on 06/04/2018 20:29:45 [name], [name] ([id number]) -------------------------------------------------------------------------------- HPI Details Patient Name: [name], [name] Date of Service: 06/04/2018 2:00 PM Medical Record Number: [id number] Patient Account Number: [id number] Date of Birth/Sex: 1928-02-16 (85 y.o. F) Treating RN: Harold Barban Primary Care Provider: Ria Bush Other Clinician: Referring Provider: Ria Bush Treating Provider/Extender: Oneida Arenas in Treatment: 10 History of Present Illness HPI Description: Readmission: 03/23/18 on evaluation today patient presents for readmission entire clinic concerning issues that she has been having for the past couple of weeks in regard to her right lower extremity. The medial injury actually occurred when her dog tried to take a blanket from her and subsequently scratched her with his toenail. Subsequently the lateral injury was a wound where she dropped a knife in her kitchen and hit the side of her leg. With that being said both have been open for some time and home health nurse that has been coming out marked for her where the redness was several days ago. Nonetheless this has spread will be on the margin of what was marked. She was placed on Keflex which was started two days ago  but at this point it does not seem to have helped with any improvement in the overall appearance of the wounds. No fevers, chills, nausea, or vomiting noted at this time. 04/02/18 Seen today for follow up and management of right lower extremity wounds. Right lateral wound with adherent slough. She is complaining of pain to touch which limited exam. Recently treated in the ED for cellulitis and wound culture positive for MRSA. Completed a dose of Vancomycin and transitioned to doxycycline by mouth. Erythema remains present with improvement since last visit. Right medial wound with scab present. No fevers, chills, nausea, or vomiting noted at this time. 04/10/18 on evaluation today patient appears to be doing a Hou better in regard to her right lateral lower [name]. The slough does seem to be coming off although it is slow from the wound bed. Fortunately there does not appear to be any evidence of infection at this time which is good news. With that being said I do believe that she is going to require longer treatment with the Santyl simply due to the fact that I'm not able to really debride the wound due to the pain that she is experiencing. 04/16/18 upon evaluation today patient's wound bed currently on the lateral lower extremity on the right appears to be doing a Kass better at this point. We are utilizing sensible currently. Overall I'm very pleased with how things are progressing although there still is some work to do before this will be completely healed. No fevers, chills, nausea, or vomiting noted at this time. 04/23/18 on evaluation today patient appears to be doing about the same at this point maybe just a Clauss bit smaller than last week's evaluation. She continues to have some slough buildup on the surface of the wound and has very Andy granulation unfortunately. This seems to be very slow moving I'm getting somewhat concerned  about the possibility of her having decreased  blood flow which is leading to her subsequently not being able to heal this area as quickly as she shut otherwise. She is currently utilizing Santyl. 05/01/18 on evaluation today patient appears to be doing somewhat poorly in regard to her lower extremity ulcer. At this point she's been tolerating the dressing changes to some degree without complication. With that being said she does note of the past several days that she has been having more pain unfortunately. On inspection it appears that she may have signs of infection which would account for the pain. 05/07/18 on evaluation today patient's wound actually appears to be doing better she's not having as much erythema in the right lower extremity as compared to last week. Fortunately there's no signs of infection this is good news as well. Overall very pleased with the progress that she has made up to this point. 05/14/18 on evaluation today patient actually appears to be doing rather well in regard to her right lower extremity ulcer. She does not have erythema that was characterizing the cellulitis prior to initiation of antibiotic therapy. With that being said her wound still appears to be somewhat slow in healing I believe this may be arterial in nature that is my concern at least. With that being said I do want her to see the vascular specialist as soon as possible we been trying to get this set up I'm not sure if she's not answering the phone or what exactly is going on here. CHRIS, NARASIMHAN ([id number]) 05/21/18 on evaluation today patient's wound actually appears to be doing a Lanius better possibly some additional granulation compared to my last evaluation. With that being said she still continues to have a lot of pain especially when her legs are elevated such as sitting in the chair here in the office. There does not appear to be any signs of infection which is good news. 05/28/18 on evaluation today patient appears to be doing a [name]  better in regard to her right lateral lower [name]. Again she was supposed to have an appointment with vascular unfortunately they attempted to call her three times and unfortunately were unable to get anything scheduled. That reason closed up for him. She tells me that if the phone is answered and she doesn't recognize what is that she will hang up as well those that she lives with. Obviously she's not gonna recognize somebody calling from the office that she has not previously spoken with. Nonetheless the patient states that she does not remember any fall. I think if we're gonna get her in with vascular specialist we're gonna have to try to make the appointment for her. 06/04/2018 Seen today for follow up and management of right lateral lower extremity wound. Adherent slough present over surface of wound. Scheduled to follow up for ABI/TBI on 06/06/2018 with vein and vascular specialist. She is hard of hearing. Wound is sensitive to touch. No signs or symptoms of infection. Denies any recent fevers, chills, or SOB. Electronic Signature(s) Signed: 06/05/2018 11:59:39 PM By: Beather Arbour FNP-C Entered By: Beather Arbour on 06/04/2018 21:16:37 [name], [name] ([id number]) -------------------------------------------------------------------------------- Physical Exam Details Patient Name: Nunziata, Lavaun C. Date of Service: 06/04/2018 2:00 PM Medical Record Number: [id number] Patient Account Number: [id number] Date of Birth/Sex: 06-30-1927 (85 y.o. F) Treating RN: Harold Barban Primary Care Provider: Ria Bush Other Clinician: Referring Provider: Ria Bush Treating Provider/Extender: Oneida Arenas in Treatment: 10 Constitutional appears in no distress. Ears,  Nose, Mouth, and Throat Patient is hard of hearing.Marland Kitchen Respiratory Respiratory effort is easy and symmetric bilaterally. Rate is normal at rest and on room air.. Cardiovascular Extremities are free of edema.  Peripheral pulses strong and equal. Capillary refill < 3 seconds.. Gastrointestinal (GI) Abdomen is soft and non-distended without masses or tenderness. Bowel sounds active in all quadrants.. Integumentary (Hair, Skin) Open wound, right lower extremity. Notes Wound exam: Right lower extremity with adherent slough over wound surface. Able to remove small area of slough with use of saline and gauze. No sharp debridement pending ABI/TBI to assess blood flow. Explained tx plan to her. She acknowledged understanding of upcoming vascular appointment. Electronic Signature(s) Signed: 06/05/2018 11:59:39 PM By: Beather Arbour FNP-C Entered By: Beather Arbour on 06/04/2018 21:26:40 [name], [name] ([id number]) -------------------------------------------------------------------------------- Physician Orders Details Patient Name: Haberle, Chennel C. Date of Service: 06/04/2018 2:00 PM Medical Record Number: [id number] Patient Account Number: [id number] Date of Birth/Sex: June 12, 1927 (85 y.o. F) Treating RN: Harold Barban Primary Care Provider: Ria Bush Other Clinician: Referring Provider: Ria Bush Treating Provider/Extender: Oneida Arenas in Treatment: 10 Verbal / Phone Orders: No Diagnosis Coding Wound Cleansing Wound #8 Right,Lateral Lower Leg o Clean wound with Normal Saline. Anesthetic (add to Medication List) Wound #8 Right,Lateral Lower Leg o Topical Lidocaine 4% cream applied to wound bed prior to debridement (In Clinic Only). Primary Wound Dressing Wound #8 Right,Lateral Lower Leg o Silver Alginate Secondary Dressing Wound #8 Right,Lateral Lower Leg o ABD and Kerlix/Conform Dressing Change Frequency Wound #8 Right,Lateral Lower Leg o Change dressing every week Follow-up Appointments Wound #8 Right,Lateral Lower Leg o Return Appointment in 1 week. Edema Control Wound #8 Right,Lateral Lower Leg o Elevate legs to the level of the heart and  pump ankles as often as possible o Other: - [place] #8 Right,Lateral Lower Leg o [place] Visits - HOLD patient will appeal discharge per Advanced Letter to patient o Home Health Nurse may visit PRN to address patientos wound care needs. o FACE TO FACE ENCOUNTER: MEDICARE and MEDICAID PATIENTS: I certify that this patient is under my care and that I had a face-to-face encounter that meets the physician face-to-face encounter requirements with this patient on this date. The encounter with the patient was in whole or in part for the following MEDICAL CONDITION: (primary reason for [place]) MEDICAL NECESSITY: I certify, that based on my findings, NURSING services are a medically necessary home health service. HOME BOUND STATUS: I certify that my clinical findings support that this patient is homebound (i.e., Due to illness or injury, pt requires aid of supportive devices such as crutches, cane, wheelchairs, walkers, the use of special transportation or the assistance of another person to leave their place of residence. There is a normal inability to leave the home Daily, Yolonda C. ([id number]) and doing so requires considerable and taxing effort. Other absences are for medical reasons / religious services and are infrequent or of short duration when for other reasons). o If current dressing causes regression in wound condition, may D/C ordered dressing product/s and apply Normal Saline Moist Dressing daily until next [place] / Other MD appointment. [place] of regression in wound condition at [phone]. o Please direct any NON-WOUND related issues/requests for orders to patient's Primary Care Physician Medications-please add to medication list. Wound #8 Right,Lateral Lower Leg o P.O. Antibiotics - Complete antibiotics Electronic Signature(s) Signed: 06/04/2018 2:48:33 PM By: Harold Barban Signed: 06/05/2018  11:59:39 PM By: Danelle Earthly,  Barbette Or FNP-C Entered By: Harold Barban on 06/04/2018 14:38:14 [name], [name] ([id number]) -------------------------------------------------------------------------------- Problem List Details Patient Name: Lillibridge, Yulieth C. Date of Service: 06/04/2018 2:00 PM Medical Record Number: [id number] Patient Account Number: [id number] Date of Birth/Sex: June 05, 1927 (85 y.o. 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 Date of Service: 06/04/2018 2:00 PM Medical Record Number: [id number] Patient Account Number: [id number] Date of Birth/Sex: 11/05/1927 (85 y.o. F) Treating RN: Harold Barban Primary Care Provider: Ria Bush Other Clinician: Referring Provider: Ria Bush Treating Provider/Extender: Oneida Arenas in Treatment: 10 Subjective Chief Complaint Information obtained from Patient RLE wounds History of Present Illness (HPI) Readmission: 03/23/18 on evaluation today patient presents for readmission entire clinic  concerning issues that she has been having for the past couple of weeks in regard to her right lower extremity. The medial injury actually occurred when her dog tried to take a blanket from her and subsequently scratched her with his toenail. Subsequently the lateral injury was a wound where she dropped a knife in her kitchen and hit the side of her leg. With that being said both have been open for some time and home health nurse that has been coming out marked for her where the redness was several days ago. Nonetheless this has spread will be on the margin of what was marked. She was placed on Keflex which was started two days ago but at this point it does not seem to have helped with any improvement in the overall appearance of the wounds. No fevers, chills, nausea, or vomiting noted at this time. 04/02/18 Seen today for follow up and management of right lower extremity wounds. Right lateral wound with adherent slough. She is complaining of pain to touch which limited exam. Recently treated in the ED for cellulitis and wound culture positive for MRSA. Completed a dose of Vancomycin and transitioned to doxycycline by mouth. Erythema remains present with improvement since last visit. Right medial wound with scab present. No fevers, chills, nausea, or vomiting noted at this time. 04/10/18 on evaluation today patient appears to be doing a Gieger better in regard to her right lateral lower [name]. The slough does seem to be coming off although it is slow from the wound bed. Fortunately there does not appear to be any evidence of infection at this time which is good news. With that being said I do believe that she is going to require longer treatment with the Santyl simply due to the fact that I'm not able to really debride the wound due to the pain that she is experiencing. 04/16/18 upon evaluation today patient's wound bed currently on the lateral lower extremity on the right appears to be doing  a Stave better at this point. We are utilizing sensible currently. Overall I'm very pleased with how things are progressing although there  still is some work to do before this will be completely healed. No fevers, chills, nausea, or vomiting noted at this time. 04/23/18 on evaluation today patient appears to be doing about the same at this point maybe just a Dickerman bit smaller than last week's evaluation. She continues to have some slough buildup on the surface of the wound and has very Rhee granulation unfortunately. This seems to be very slow moving I'm getting somewhat concerned about the possibility of her having decreased blood flow which is leading to her subsequently not being able to heal this area as quickly as she shut otherwise. She is currently utilizing Santyl. 05/01/18 on evaluation today patient appears to be doing somewhat poorly in regard to her lower extremity ulcer. At this point she's been tolerating the dressing changes to some degree without complication. With that being said she does note of the past several days that she has been having more pain unfortunately. On inspection it appears that she may have signs of infection which would account for the pain. 05/07/18 on evaluation today patient's wound actually appears to be doing better she's not having as much erythema in the right lower extremity as compared to last week. Fortunately there's no signs of infection this is good news as well. Overall very pleased with the progress that she has made up to this point. KASHISH, YGLESIAS ([id number]) 05/14/18 on evaluation today patient actually appears to be doing rather well in regard to her right lower extremity ulcer. She does not have erythema that was characterizing the cellulitis prior to initiation of antibiotic therapy. With that being said her wound still appears to be somewhat slow in healing I believe this may be arterial in nature that is my concern at least. With that  being said I do want her to see the vascular specialist as soon as possible we been trying to get this set up I'm not sure if she's not answering the phone or what exactly is going on here. 05/21/18 on evaluation today patient's wound actually appears to be doing a Malczewski better possibly some additional granulation compared to my last evaluation. With that being said she still continues to have a lot of pain especially when her legs are elevated such as sitting in the chair here in the office. There does not appear to be any signs of infection which is good news. 05/28/18 on evaluation today patient appears to be doing a Christiano better in regard to her right lateral lower [name]. Again she was supposed to have an appointment with vascular unfortunately they attempted to call her three times and unfortunately were unable to get anything scheduled. That reason closed up for him. She tells me that if the phone is answered and she doesn't recognize what is that she will hang up as well those that she lives with. Obviously she's not gonna recognize somebody calling from the office that she has not previously spoken with. Nonetheless the patient states that she does not remember any fall. I think if we're gonna get her in with vascular specialist we're gonna have to try to make the appointment for her. 06/04/2018 Seen today for follow up and management of right lateral lower extremity wound. Adherent slough present over surface of wound. Scheduled to follow up for ABI/TBI on 06/06/2018 with vein and vascular specialist. She is hard of hearing. Wound is sensitive to touch. No signs or symptoms of infection. Denies any recent fevers, chills, or SOB. Patient History Information obtained  from Patient. Family History Diabetes - Father,Siblings,Child, Hypertension - Child, Lung Disease - Child, Stroke - Mother, No family history of Cancer, Heart Disease, Kidney Disease, Seizures, Thyroid Problems. Social  History Former smoker, Marital Status - Single, Alcohol Use - Never, Drug Use - No History, Caffeine Use - Daily. Medical History Eyes Patient has history of Cataracts - removed Denies history of Glaucoma, Optic Neuritis Hematologic/Lymphatic Patient has history of Anemia Denies history of Hemophilia, Human Immunodeficiency Virus, Lymphedema, Sickle Cell Disease Respiratory Denies history of Aspiration, Asthma, Chronic Obstructive Pulmonary Disease (COPD), Pneumothorax, Sleep Apnea, Tuberculosis Cardiovascular Patient has history of Arrhythmia, Coronary Artery Disease, Hypertension, Myocardial Infarction Denies history of Angina, Congestive Heart Failure, Deep Vein Thrombosis, Hypotension, Peripheral Arterial Disease, Peripheral Venous Disease, Phlebitis Endocrine Patient has history of Type II Diabetes Denies history of Type I Diabetes Genitourinary Denies history of End Stage Renal Disease Immunological Denies history of Lupus Erythematosus, Raynaud s, Scleroderma Integumentary (Skin) Denies history of History of Burn, History of pressure wounds Musculoskeletal Patient has history of Osteoarthritis Denies history of Gout, Rheumatoid Arthritis, Osteomyelitis [name], [name] ([id number]) Neurologic Denies history of Dementia, Neuropathy, Quadriplegia, Paraplegia, Seizure Disorder Oncologic Denies history of Received Chemotherapy, Received Radiation Medical And Surgical History Notes Constitutional Symptoms (General Health) High Blood Pressure, open heart surgery, gall stone, hysterectomy; Fluid pill, Anxiety Review of Systems (ROS) Constitutional Symptoms (General Health) The patient has no complaints or symptoms. Ear/Nose/Mouth/Throat HOH Respiratory The patient has no complaints or symptoms. Cardiovascular The patient has no complaints or symptoms. Gastrointestinal The patient has no complaints or symptoms. Integumentary (Skin) Complains or has symptoms of Wounds -  Right lower extremity. Objective Constitutional appears in no distress. Vitals Time Taken: 2:02 PM, Height: 58 in, Weight: 140 lbs, BMI: 29.3, Temperature: 97.7 F, Pulse: 80 bpm, Respiratory Rate: 16 breaths/min, Blood Pressure: 126/73 mmHg. Ears, Nose, Mouth, and Throat Patient is hard of hearing.Marland Kitchen Respiratory Respiratory effort is easy and symmetric bilaterally. Rate is normal at rest and on room air.. Cardiovascular Extremities are free of edema. Peripheral pulses strong and equal. Capillary refill < 3 seconds.. Gastrointestinal (GI) Abdomen is soft and non-distended without masses or tenderness. Bowel sounds active in all quadrants.. General Notes: Wound exam: Right lower extremity with adherent slough over wound surface. Able to remove small area of slough with use of saline and gauze. No sharp debridement pending ABI/TBI to assess blood flow. Explained tx plan to her. She acknowledged understanding of upcoming vascular appointment. Integumentary (Hair, Skin) Open wound, right lower extremity. [name], [name] ([id number]) Wound #8 status is Open. Original cause of wound was Trauma. The wound is located on the Right,Lateral Lower Leg. The wound measures 3.7cm length x 1.3cm width x 0.1cm depth; 3.778cm^2 area and 0.378cm^3 volume. There is Fat Layer (Subcutaneous Tissue) Exposed exposed. There is no tunneling or undermining noted. There is a medium amount of purulent drainage noted. The wound margin is flat and intact. There is small (1-33%) red granulation within the wound bed. There is a large (67-100%) amount of necrotic tissue within the wound bed including Eschar and Adherent Slough. The periwound skin appearance exhibited: Excoriation, Dry/Scaly, Erythema. The periwound skin appearance did not exhibit: Callus, Crepitus, Induration, Rash, Scarring, Maceration, Atrophie Blanche, Cyanosis, Ecchymosis, Hemosiderin Staining, Mottled, Pallor, Rubor. The surrounding wound skin color  is noted with erythema which is circumferential. Periwound temperature was noted as No Abnormality. The periwound has tenderness on palpation. Assessment Active Problems ICD-10 Chronic venous hypertension (idiopathic) with ulcer and inflammation of right lower  extremity Type 2 diabetes mellitus with other skin ulcer Non-pressure chronic ulcer of other part of right lower leg with fat layer exposed Cellulitis of right lower limb Plan Wound Cleansing: Wound #8 Right,Lateral Lower Leg: Clean wound with Normal Saline. Anesthetic (add to Medication List): Wound #8 Right,Lateral Lower Leg: Topical Lidocaine 4% cream applied to wound bed prior to debridement (In Clinic Only). Primary Wound Dressing: Wound #8 Right,Lateral Lower Leg: Silver Alginate Secondary Dressing: Wound #8 Right,Lateral Lower Leg: ABD and Kerlix/Conform Dressing Change Frequency: Wound #8 Right,Lateral Lower Leg: Change dressing every week Follow-up Appointments: Wound #8 Right,Lateral Lower Leg: Return Appointment in 1 week. Edema Control: Wound #8 Right,Lateral Lower Leg: Elevate legs to the level of the heart and pump ankles as often as possible Other: - tubigrip Home Health: Wound #8 Right,Lateral Lower Leg: Continue Home Health Visits - HOLD patient will appeal discharge per Advanced Letter to patient Home Health Nurse may visit PRN to address patient s wound care needs. FACE TO FACE ENCOUNTER: MEDICARE and MEDICAID PATIENTS: I certify that this patient is under my care and that I [name], [name] ([id number]) had a face-to-face encounter that meets the physician face-to-face encounter requirements with this patient on this date. The encounter with the patient was in whole or in part for the following MEDICAL CONDITION: (primary reason for [place]) MEDICAL NECESSITY: I certify, that based on my findings, NURSING services are a medically necessary home health service. HOME BOUND STATUS: I certify  that my clinical findings support that this patient is homebound (i.e., Due to illness or injury, pt requires aid of supportive devices such as crutches, cane, wheelchairs, walkers, the use of special transportation or the assistance of another person to leave their place of residence. There is a normal inability to leave the home and doing so requires considerable and taxing effort. Other absences are for medical reasons / religious services and are infrequent or of short duration when for other reasons). If current dressing causes regression in wound condition, may D/C ordered dressing product/s and apply Normal Saline Moist Dressing daily until next [place] / Other MD appointment. [place] of regression in wound condition at [phone]. Please direct any NON-WOUND related issues/requests for orders to patient's Primary Care Physician Medications-please add to medication list.: Wound #8 Right,Lateral Lower Leg: P.O. Antibiotics - Complete antibiotics Electronic Signature(s) Signed: 06/05/2018 11:59:39 PM By: Beather Arbour FNP-C Entered By: Beather Arbour on 06/04/2018 21:27:00 [name], [name] ([id number]) -------------------------------------------------------------------------------- ROS/PFSH Details Patient Name: Densmore, Odeth C. Date of Service: 06/04/2018 2:00 PM Medical Record Number: [id number] Patient Account Number: [id number] Date of Birth/Sex: March 28, 1928 (85 y.o. F) Treating RN: Harold Barban Primary Care Provider: Ria Bush Other Clinician: Referring Provider: Ria Bush Treating Provider/Extender: Oneida Arenas in Treatment: 10 Information Obtained From Patient Integumentary (Skin) Complaints and Symptoms: Positive for: Wounds - Right lower extremity Medical History: Negative for: History of Burn; History of pressure wounds Constitutional Symptoms (General Health) Complaints and Symptoms: No Complaints or  Symptoms Medical History: Past Medical History Notes: High Blood Pressure, open heart surgery, gall stone, hysterectomy; Fluid pill, Anxiety Eyes Medical History: Positive for: Cataracts - removed Negative for: Glaucoma; Optic Neuritis Ear/Nose/Mouth/Throat Complaints and Symptoms: Review of System Notes: [facility] Hematologic/Lymphatic Medical History: Positive for: Anemia Negative for: Hemophilia; Human Immunodeficiency Virus; Lymphedema; Sickle Cell Disease Respiratory Complaints and Symptoms: No Complaints or Symptoms Medical History: Negative for: Aspiration; Asthma; Chronic Obstructive Pulmonary Disease (COPD); Pneumothorax; Sleep Apnea; Tuberculosis Cardiovascular Complaints and Symptoms:  No Complaints or Symptoms Kell, Mela C. ([id number]) Medical History: Positive for: Arrhythmia; Coronary Artery Disease; Hypertension; Myocardial Infarction Negative for: Angina; Congestive Heart Failure; Deep Vein Thrombosis; Hypotension; Peripheral Arterial Disease; Peripheral Venous Disease; Phlebitis Gastrointestinal Complaints and Symptoms: No Complaints or Symptoms Endocrine Medical History: Positive for: Type II Diabetes Negative for: Type I Diabetes Time with diabetes: no idea Treated with: Oral agents Blood sugar tested every day: No Genitourinary Medical History: Negative for: End Stage Renal Disease Immunological Medical History: Negative for: Lupus Erythematosus; Raynaudos; Scleroderma Musculoskeletal Medical History: Positive for: Osteoarthritis Negative for: Gout; Rheumatoid Arthritis; Osteomyelitis Neurologic Medical History: Negative for: Dementia; Neuropathy; Quadriplegia; Paraplegia; Seizure Disorder Oncologic Medical History: Negative for: Received Chemotherapy; Received Radiation HBO Extended History Items Eyes: Cataracts Immunizations Pneumococcal Vaccine: Received Pneumococcal Vaccination: Yes Implantable Devices No devices added Family and  Social History NEOSHA, SWITALSKI. ([id number]) Cancer: No; Diabetes: Yes - Father,Siblings,Child; Heart Disease: No; Hypertension: Yes - Child; Kidney Disease: No; Lung Disease: Yes - Child; Seizures: No; Stroke: Yes - Mother; Thyroid Problems: No; Former smoker; Marital Status - Single; Alcohol Use: Never; Drug Use: No History; Caffeine Use: Daily; Financial Concerns: No; Food, Clothing or Shelter Needs: No; Support System Lacking: No; Transportation Concerns: No; Advanced Directives: Yes (Not Provided); Patient does not want information on Advanced Directives; Living Will: Yes (Not Provided) Physician Affirmation I have reviewed and agree with the above information. Electronic Signature(s) Signed: 06/05/2018 11:59:39 PM By: Beather Arbour FNP-C Signed: 06/08/2018 3:21:09 PM By: Harold Barban Entered By: Beather Arbour on 06/04/2018 21:20:41 [place], Issis Loletha [name] ([id number]) -------------------------------------------------------------------------------- SuperBill Details Patient Name: Smedberg, Sherese C. Date of Service: 06/04/2018 Medical Record Number: [id number] Patient Account Number: [id number] Date of Birth/Sex: 11/06/1927 (85 y.o. F) Treating RN: Harold Barban Primary Care Provider: Ria Bush Other Clinician: Referring Provider: Ria Bush Treating Provider/Extender: Oneida Arenas in Treatment: 10 Diagnosis Coding ICD-10 Codes Code Description I87.331 Chronic venous hypertension (idiopathic) with ulcer and inflammation of right lower extremity E11.622 Type 2 diabetes mellitus with other skin ulcer L97.812 Non-pressure chronic ulcer of other part of right lower leg with fat layer exposed L03.115 Cellulitis of right lower limb Facility Procedures CPT4 Code: 39030092 Description: 33007 - WOUND CARE VISIT-LEV 2 EST PT Modifier: Quantity: 1 Physician Procedures CPT4: Description Modifier Quantity Code [id number] [id number] - WC PHYS LEVEL 2 - EST PT 1 ICD-10 Diagnosis  Description I87.331 Chronic venous hypertension (idiopathic) with ulcer and inflammation of right lower extremity L97.812 Non-pressure chronic ulcer of  other part of right lower leg with fat layer exposed Electronic Signature(s) Signed: 06/05/2018 11:59:39 PM By: Beather Arbour FNP-C Entered By: Beather Arbour on 06/04/2018 21:27:48

## 2018-06-11 ENCOUNTER — Encounter: Payer: Medicare Other | Admitting: Physician Assistant

## 2018-06-11 DIAGNOSIS — I252 Old myocardial infarction: Secondary | ICD-10-CM | POA: Diagnosis not present

## 2018-06-11 DIAGNOSIS — S91301A Unspecified open wound, right foot, initial encounter: Secondary | ICD-10-CM | POA: Diagnosis not present

## 2018-06-11 DIAGNOSIS — L97812 Non-pressure chronic ulcer of other part of right lower leg with fat layer exposed: Secondary | ICD-10-CM | POA: Diagnosis not present

## 2018-06-11 DIAGNOSIS — I87331 Chronic venous hypertension (idiopathic) with ulcer and inflammation of right lower extremity: Secondary | ICD-10-CM | POA: Diagnosis not present

## 2018-06-11 DIAGNOSIS — E11622 Type 2 diabetes mellitus with other skin ulcer: Secondary | ICD-10-CM | POA: Diagnosis not present

## 2018-06-11 DIAGNOSIS — L03115 Cellulitis of right lower limb: Secondary | ICD-10-CM | POA: Diagnosis not present

## 2018-06-11 DIAGNOSIS — M1990 Unspecified osteoarthritis, unspecified site: Secondary | ICD-10-CM | POA: Diagnosis not present

## 2018-06-12 NOTE — Progress Notes (Signed)
[name] [name] ([id number]) Visit Report for 06/11/2018 Arrival Information Details Patient Name: [name] [name] [name]. Date of Service: 06/11/2018 2:30 PM Medical Record Number: [id number] Patient Account Number: [id number] Date of Birth/Sex: 1928-01-22 (85 y.o. F) Treating RN: Montey Hora Primary Care [name] [name] Other Clinician: Referring [name] [name] [name] [name] Weeks in Treatment: 11 Visit Information History Since Last Visit Added or deleted any medications: No Patient Arrived: [name] Any new allergies or adverse reactions: No Arrival Time: 15:03 Had a fall or experienced change in No Accompanied By: self activities of daily living that may affect Transfer Assistance: None risk of falls: Patient Identification Verified: Yes Signs or symptoms of abuse/neglect since last visito No Secondary Verification Process Completed: Yes Hospitalized since last visit: No Implantable device outside of the clinic excluding No cellular tissue based products placed in the center since last visit: Has Dressing in Place as Prescribed: Yes Pain Present Now: No Electronic Signature(s) Signed: 06/11/2018 4:27:26 PM By: Montey Hora Entered By: Montey Hora on 06/11/2018 15:04:01 [name] [name] ([id number]) -------------------------------------------------------------------------------- Clinic Level of Care Assessment Details Patient Name: [name] [name] [name]. Date of Service: 06/11/2018 2:30 PM Medical Record Number: [id number] Patient Account Number: [id number] Date of Birth/Sex: 03/05/1928 (85 y.o. F) Treating RN: Harold Barban Primary Care Melyna Huron: Ria [name] Other Clinician: Referring Ikesha Siller: Ria [name] [name] Braylee Lal/Extender: Melburn Hake, [name] Weeks in Treatment: 11 Clinic Level of Care Assessment Items TOOL 4 Quantity Score []  - Use when only an EandM is performed on FOLLOW-UP visit 0 ASSESSMENTS -  Nursing Assessment / Reassessment X - Reassessment of Co-morbidities (includes updates in patient status) 1 10 X- 1 5 Reassessment of Adherence to Treatment Plan ASSESSMENTS - Wound and Skin Assessment / Reassessment X - Simple Wound Assessment / Reassessment - one wound 1 5 []  - 0 Complex Wound Assessment / Reassessment - multiple wounds []  - 0 Dermatologic / Skin Assessment (not related to wound area) ASSESSMENTS - Focused Assessment []  - Circumferential Edema Measurements - multi extremities 0 []  - 0 Nutritional Assessment / Counseling / Intervention []  - 0 Lower Extremity Assessment (monofilament, tuning fork, pulses) []  - 0 Peripheral Arterial Disease Assessment (using hand held doppler) ASSESSMENTS - Ostomy and/or Continence Assessment and Care []  - Incontinence Assessment and Management 0 []  - 0 Ostomy Care Assessment and Management (repouching, etc.) PROCESS - Coordination of Care X - Simple Patient / Family Education for ongoing care 1 15 []  - 0 Complex (extensive) Patient / Family Education for ongoing care []  - 0 Staff obtains Programmer, systems, Records, Test Results / Process Orders []  - 0 Staff telephones HHA, Nursing Homes / Clarify orders / etc []  - 0 Routine Transfer to another Facility (non-emergent condition) []  - 0 Routine Hospital Admission (non-emergent condition) []  - 0 New Admissions / Biomedical engineer / Ordering NPWT, Apligraf, etc. []  - 0 Emergency Hospital Admission (emergent condition) X- 1 10 Simple Discharge Coordination [name] [name] [name]. ([id number]) []  - 0 Complex (extensive) Discharge Coordination PROCESS - Special Needs []  - Pediatric / Minor Patient Management 0 []  - 0 Isolation Patient Management []  - 0 Hearing / Language / Visual special needs []  - 0 Assessment of Community assistance (transportation, D/[name] planning, etc.) []  - 0 Additional assistance / Altered mentation []  - 0 Support Surface(s) Assessment (bed, cushion, seat,  etc.) INTERVENTIONS - Wound Cleansing / Measurement X - Simple Wound Cleansing - one wound 1 5 []  - 0 Complex Wound Cleansing - multiple wounds X- 1 5 Wound Imaging (  photographs - any number of wounds) []  - 0 Wound Tracing (instead of photographs) X- 1 5 Simple Wound Measurement - one wound []  - 0 Complex Wound Measurement - multiple wounds INTERVENTIONS - Wound Dressings X - Small Wound Dressing one or multiple wounds 2 10 []  - 0 Medium Wound Dressing one or multiple wounds []  - 0 Large Wound Dressing one or multiple wounds []  - 0 Application of Medications - topical []  - 0 Application of Medications - injection INTERVENTIONS - Miscellaneous []  - External ear exam 0 []  - 0 Specimen Collection (cultures, biopsies, blood, body fluids, etc.) []  - 0 Specimen(s) / Culture(s) sent or taken to Lab for analysis []  - 0 Patient Transfer (multiple staff / Civil Service fast streamer / Similar devices) []  - 0 Simple Staple / Suture removal (25 or less) []  - 0 Complex Staple / Suture removal (26 or more) []  - 0 Hypo / Hyperglycemic Management (close monitor of Blood Glucose) []  - 0 Ankle / Brachial Index (ABI) - do not check if billed separately X- 1 5 Vital Signs [name] [name] [name]. ([id number]) Has the patient been seen at the hospital within the last three years: Yes Total Score: 85 Level Of Care: New/Established - Level 3 Electronic Signature(s) Signed: 06/11/2018 5:00:09 PM By: Harold Barban Entered By: Harold Barban on 06/11/2018 15:37:09 [name] [name] ([id number]) -------------------------------------------------------------------------------- Encounter Discharge Information Details Patient Name: [name] [name]. Date of Service: 06/11/2018 2:30 PM Medical Record Number: [id number] Patient Account Number: [id number] Date of Birth/Sex: Oct 11, 1927 (85 y.o. F) Treating RN: Harold Barban Primary Care Geetika Laborde: Ria [name] Other Clinician: Referring Terryann Verbeek: Ria [name] [name] Karole Oo/Extender: Melburn Hake, [name] Weeks in Treatment: 11 Encounter Discharge Information Items Schedule Follow-up Appointment: No Clinical Summary of Care: Provided on 06/11/2018 Form Type Recipient Paper Patient ll Electronic Signature(s) Signed: 06/11/2018 4:40:31 PM By: Lorine Bears RCP, RRT, CHT Entered By: Lorine Bears on 06/11/2018 15:59:47 [name] [name] ([id number]) -------------------------------------------------------------------------------- Lower Extremity Assessment Details Patient Name: [name] [name] [name]. Date of Service: 06/11/2018 2:30 PM Medical Record Number: [id number] Patient Account Number: [id number] Date of Birth/Sex: 17-Dec-1927 (85 y.o. F) Treating RN: Montey Hora Primary Care Ezelle Surprenant: Ria [name] Other Clinician: Referring Anquan Azzarello: Ria [name] [name] Noelene Gang/Extender: Melburn Hake, [name] Weeks in Treatment: 11 Edema Assessment Assessed: [Left: No] [Right: No] [Left: Edema] [Right: :] Calf Left: Right: Point of Measurement: 30 cm From Medial Instep cm 27.2 cm Ankle Left: Right: Point of Measurement: 12 cm From Medial Instep cm 19 cm Vascular Assessment Pulses: Dorsalis Pedis Palpable: [Right:Yes] Posterior Tibial Extremity colors, hair growth, and conditions: Extremity Color: [Right:Normal] Hair Growth on Extremity: [Right:No] Temperature of Extremity: [Right:Cool] Capillary Refill: [Right:< 3 seconds] Toe Nail Assessment Left: Right: Thick: Yes Discolored: Yes Deformed: Yes Improper Length and Hygiene: Yes Electronic Signature(s) Signed: 06/11/2018 4:27:26 PM By: Montey Hora Entered By: Montey Hora on 06/11/2018 15:16:22 Ognibene, Lyndon CMarland Kitchen ([id number]) -------------------------------------------------------------------------------- Multi Wound Chart Details Patient Name: [name] [name] [name]. Date of Service: 06/11/2018 2:30 PM Medical Record Number: [id number] Patient Account  Number: [id number] Date of Birth/Sex: [birth year] (85 y.o. F) Treating RN: Harold Barban Primary Care Astor Gentle: Ria [name] Other Clinician: Referring Daizha Anand: Ria [name] [name] Dyani Babel/Extender: STONE III, [name] Weeks in Treatment: 11 Vital Signs Height(in): 58 Pulse(bpm): 48 Weight(lbs): 140 Blood Pressure(mmHg): 113/63 Body Mass Index(BMI): 29 Temperature(F): 97.9 Respiratory Rate 18 (breaths/min): Photos: [N/A:N/A] Wound Location: Right Lower Leg - Lateral Right Foot - Dorsal N/A Wounding Event: Trauma Trauma N/A Primary Etiology: Diabetic Wound/Ulcer of the Trauma, Other N/A  Lower Extremity Comorbid History: Cataracts, Anemia, Cataracts, Anemia, N/A Arrhythmia, Coronary Artery Arrhythmia, Coronary Artery Disease, Hypertension, Disease, Hypertension, Myocardial Infarction, Type II Myocardial Infarction, Type II Diabetes, Osteoarthritis Diabetes, Osteoarthritis Date Acquired: 03/09/2018 06/10/2018 N/A Weeks of Treatment: 11 0 N/A Wound Status: Open Open N/A Measurements L x W x D 3.7x1.4x0.2 0.3x0.6x0.1 N/A (cm) Area (cm) : 4.068 0.141 N/A Volume (cm) : 0.814 0.014 N/A % Reduction in Area: -115.80% N/A N/A % Reduction in Volume: -44.10% N/A N/A Classification: Grade 2 Partial Thickness N/A Exudate Amount: Medium None Present N/A Exudate Type: Purulent N/A N/A Exudate Color: yellow, brown, green N/A N/A Wound Margin: Flat and Intact Flat and Intact N/A Granulation Amount: None Present (0%) None Present (0%) N/A Necrotic Amount: Large (67-100%) Large (67-100%) N/A Necrotic Tissue: Adherent Slough Eschar N/A Exposed Structures: Fat Layer (Subcutaneous Fascia: No N/A Tissue) Exposed: Yes Fat Layer (Subcutaneous Fascia: No Tissue) Exposed: No [name] [name] [name]. ([id number]) Tendon: No Tendon: No Muscle: No Muscle: No Joint: No Joint: No Bone: No Bone: No Limited to Skin Breakdown Epithelialization: None None N/A Periwound Skin  Texture: Excoriation: Yes Excoriation: No N/A Induration: No Induration: No Callus: No Callus: No Crepitus: No Crepitus: No Rash: No Rash: No Scarring: No Scarring: No Periwound Skin Moisture: Dry/Scaly: Yes Maceration: No N/A Maceration: No Dry/Scaly: No Periwound Skin Color: Erythema: Yes Atrophie Blanche: No N/A Atrophie Blanche: No Cyanosis: No Cyanosis: No Ecchymosis: No Ecchymosis: No Erythema: No Hemosiderin Staining: No Hemosiderin Staining: No Mottled: No Mottled: No Pallor: No Pallor: No Rubor: No Rubor: No Erythema Location: Circumferential N/A N/A Temperature: No Abnormality No Abnormality N/A Tenderness on Palpation: Yes Yes N/A Wound Preparation: Ulcer Cleansing: Ulcer Cleansing: N/A Rinsed/Irrigated with Saline Rinsed/Irrigated with Saline Topical Anesthetic Applied: Topical Anesthetic Applied: Other: lidocaine 4% Other: lidocaine 4% Treatment Notes Electronic Signature(s) Signed: 06/11/2018 5:00:09 PM By: Harold Barban Entered By: Harold Barban on 06/11/2018 15:31:25 Knipfer, [name] [name] ([id number]) -------------------------------------------------------------------------------- [place] Details Patient Name: [name] [name] [name]. Date of Service: 06/11/2018 2:30 PM Medical Record Number: [id number] Patient Account Number: [id number] Date of Birth/Sex: [birth year] (85 y.o. F) Treating RN: Harold Barban Primary Care Shelli Portilla: Ria [name] Other Clinician: Referring Gabbrielle Mcnicholas: Ria [name] [name] Encarnacion Scioneaux/Extender: Melburn Hake, [name] Weeks in Treatment: 11 Active Inactive Abuse / Safety / Falls / Self Care Management Nursing Diagnoses: Potential for falls Goals: Patient will not experience any injury related to falls Date Initiated: 03/23/2018 Target Resolution Date: 06/09/2018 Goal Status: Active Interventions: Assess fall risk on admission and as needed Notes: Orientation to the Wound Care Program Nursing  Diagnoses: Knowledge deficit related to the wound healing center program Goals: Patient/caregiver will verbalize understanding of the [facility] Date Initiated: 03/23/2018 Target Resolution Date: 06/09/2018 Goal Status: Active Interventions: Provide education on orientation to the wound center Notes: Pain, Acute or Chronic Nursing Diagnoses: Pain, acute or chronic: actual or potential Goals: Patient will verbalize adequate pain control and receive pain control interventions during procedures as needed Date Initiated: 03/23/2018 Target Resolution Date: 06/09/2018 Goal Status: Active Interventions: Assess comfort goal upon admission Taulbee, Larie [name]. ([id number]) Notes: Wound/Skin Impairment Nursing Diagnoses: Impaired tissue integrity Goals: Ulcer/skin breakdown will heal within 14 weeks Date Initiated: 03/23/2018 Target Resolution Date: 06/09/2018 Goal Status: Active Interventions: Assess patient/caregiver ability to obtain necessary supplies Assess patient/caregiver ability to perform ulcer/skin care regimen upon admission and as needed Assess ulceration(s) every visit Notes: Electronic Signature(s) Signed: 06/11/2018 5:00:09 PM By: Harold Barban Entered By: Harold Barban on 06/11/2018 15:31:00 [name] [name] [name]. (  [id number]) -------------------------------------------------------------------------------- Pain Assessment Details Patient Name: Fetch, [name] [name]. Date of Service: 06/11/2018 2:30 PM Medical Record Number: [id number] Patient Account Number: [id number] Date of Birth/Sex: [birth year] (85 y.o. F) Treating RN: Montey Hora Primary Care Areal Cochrane: Ria [name] Other Clinician: Referring Savas Elvin: Ria [name] [name] Loron Weimer/Extender: Melburn Hake, [name] Weeks in Treatment: 11 Active Problems Location of Pain Severity and Description of Pain Patient Has [name] Yes Site Locations Pain Location: Pain in Ulcers With Dressing Change:  Yes Duration of the Pain. Constant / Intermittento Constant Pain Management and Medication Current Pain Management: Electronic Signature(s) Signed: 06/11/2018 4:27:26 PM By: Montey Hora Entered By: Montey Hora on 06/11/2018 15:04:12 Potempa, [name] [name] ([id number]) -------------------------------------------------------------------------------- Patient/Caregiver Education Details Patient Name: Oborn, Icey [name]. Date of Service: 06/11/2018 2:30 PM Medical Record Number: [id number] Patient Account Number: [id number] Date of Birth/Gender: 04-27-1927 (85 y.o. F) Treating RN: Harold Barban Primary Care Physician: Ria [name] Other Clinician: Referring Physician: Ria [name] [name] Physician/Extender: Sharalyn Ink in Treatment: 11 Education Assessment Education Provided To: Patient Education Topics Provided Wound/Skin Impairment: Handouts: Caring for Your Ulcer Methods: Demonstration, Explain/Verbal Responses: State content correctly Electronic Signature(s) Signed: 06/11/2018 5:00:09 PM By: Harold Barban Entered By: Harold Barban on 06/11/2018 15:31:45 Engelmann, Ashyia [name]. ([id number]) -------------------------------------------------------------------------------- Wound Assessment Details Patient Name: Nolden, Jaylynn [name]. Date of Service: 06/11/2018 2:30 PM Medical Record Number: [id number] Patient Account Number: [id number] Date of Birth/Sex: 1927-07-01 (85 y.o. F) Treating RN: Montey Hora Primary Care Aasim Restivo: Ria [name] Other Clinician: Referring Emanii Bugbee: Ria [name] [name] Shevonne Wolf/Extender: STONE III, [name] Weeks in Treatment: 11 Wound Status Wound Number: 8 Primary Diabetic Wound/Ulcer of the Lower Extremity Etiology: Wound Location: Right Lower Leg - Lateral Wound Open Wounding Event: Trauma Status: Date Acquired: 03/09/2018 Comorbid Cataracts, Anemia, Arrhythmia, Coronary Artery Weeks Of Treatment: 11 History: Disease,  Hypertension, Myocardial Infarction, Clustered Wound: No Type II Diabetes, Osteoarthritis Photos Wound Measurements Length: (cm) 3.7 % Reduction in Width: (cm) 1.4 % Reduction in Depth: (cm) 0.2 Epithelializat Area: (cm) 4.068 Tunneling: Volume: (cm) 0.814 Undermining: Area: -115.8% Volume: -44.1% ion: None No No Wound Description Classification: Grade 2 Foul Odor Afte Wound Margin: Flat and Intact Slough/Fibrino Exudate Amount: Medium Exudate Type: Purulent Exudate Color: yellow, brown, green r Cleansing: No Yes Wound Bed Granulation Amount: None Present (0%) Exposed Structure Necrotic Amount: Large (67-100%) Fascia Exposed: No Necrotic Quality: Adherent Slough Fat Layer (Subcutaneous Tissue) Exposed: Yes Tendon Exposed: No Muscle Exposed: No Joint Exposed: No Bone Exposed: No Periwound Skin Texture Texture Color Slape, Jullie [name]. ([id number]) No Abnormalities Noted: No No Abnormalities Noted: No Callus: No Atrophie Blanche: No Crepitus: No Cyanosis: No Excoriation: Yes Ecchymosis: No Induration: No Erythema: Yes Rash: No Erythema Location: Circumferential Scarring: No Hemosiderin Staining: No Mottled: No Moisture Pallor: No No Abnormalities Noted: No Rubor: No Dry / Scaly: Yes Maceration: No Temperature / Pain Temperature: No Abnormality Tenderness on Palpation: Yes Wound Preparation Ulcer Cleansing: Rinsed/Irrigated with Saline Topical Anesthetic Applied: Other: lidocaine 4%, Treatment Notes Wound #8 (Right, Lateral Lower Leg) Notes silver cell, ABD, conform Electronic Signature(s) Signed: 06/11/2018 4:27:26 PM By: Montey Hora Entered By: Montey Hora on 06/11/2018 15:12:59 Peitz, Len [name]. ([id number]) -------------------------------------------------------------------------------- Wound Assessment Details Patient Name: Bearce, Gyanna [name]. Date of Service: 06/11/2018 2:30 PM Medical Record Number: [id number] Patient Account Number:  [id number] Date of Birth/Sex: Feb 04, 1928 (85 y.o. F) Treating RN: Montey Hora Primary Care Jacqueline Spofford: Ria [name] Other Clinician: Referring Cregg Jutte: Ria [name] [name] Delonte Musich/Extender: STONE III, [name] Weeks in Treatment: 11 Wound Status Wound Number: 9 Primary Trauma,  Other Etiology: Wound Location: Right Foot - Dorsal Wound Open Wounding Event: Trauma Status: Date Acquired: 06/10/2018 Comorbid Cataracts, Anemia, Arrhythmia, Coronary Artery Weeks Of Treatment: 0 History: Disease, Hypertension, Myocardial Infarction, Clustered Wound: No Type II Diabetes, Osteoarthritis Photos Wound Measurements Length: (cm) 0.3 Width: (cm) 0.6 Depth: (cm) 0.1 Area: (cm) 0.141 Volume: (cm) 0.014 % Reduction in Area: % Reduction in Volume: Epithelialization: None Tunneling: No Undermining: No Wound Description Classification: Partial Thickness Foul Odor Wound Margin: Flat and Intact Slough/Fi Exudate Amount: None Present After Cleansing: No brino No Wound Bed Granulation Amount: None Present (0%) Exposed Structure Necrotic Amount: Large (67-100%) Fascia Exposed: No Necrotic Quality: Eschar Fat Layer (Subcutaneous Tissue) Exposed: No Tendon Exposed: No Muscle Exposed: No Joint Exposed: No Bone Exposed: No Limited to Skin Breakdown Periwound Skin Texture Texture Color No Abnormalities Noted: No No Abnormalities Noted: No Erb, Keana [name]. ([id number]) Callus: No Atrophie Blanche: No Crepitus: No Cyanosis: No Excoriation: No Ecchymosis: No Induration: No Erythema: No Rash: No Hemosiderin Staining: No Scarring: No Mottled: No Pallor: No Moisture Rubor: No No Abnormalities Noted: No Dry / Scaly: No Temperature / Pain Maceration: No Temperature: No Abnormality Tenderness on Palpation: Yes Wound Preparation Ulcer Cleansing: Rinsed/Irrigated with Saline Topical Anesthetic Applied: Other: lidocaine 4%, Treatment Notes Wound #9 (Right, Dorsal  Foot) Notes silver cell, ABD, conform Electronic Signature(s) Signed: 06/11/2018 4:27:26 PM By: Montey Hora Entered By: Montey Hora on 06/11/2018 15:15:01 Couser, [name] [name] ([id number]) -------------------------------------------------------------------------------- Vitals Details Patient Name: Jedlicka, Amai [name]. Date of Service: 06/11/2018 2:30 PM Medical Record Number: [id number] Patient Account Number: [id number] Date of Birth/Sex: 1927/06/20 (85 y.o. F) Treating RN: Montey Hora Primary Care Piotr Christopher: Ria [name] Other Clinician: Referring Johnetta Sloniker: Ria [name] [name] Onika Gudiel/Extender: STONE III, [name] Weeks in Treatment: 11 Vital Signs Time Taken: 15:05 Temperature (F): 97.9 Height (in): 58 Pulse (bpm): 79 Weight (lbs): 140 Respiratory Rate (breaths/min): 18 Body Mass Index (BMI): 29.3 Blood Pressure (mmHg): 113/63 Reference Range: 80 - 120 mg / dl Electronic Signature(s) Signed: 06/11/2018 4:27:26 PM By: Montey Hora Entered By: Montey Hora on 06/11/2018 15:07:52

## 2018-06-12 NOTE — Progress Notes (Signed)
DEVAN, BABINO ([id number]) Visit Report for 06/11/2018 Chief Complaint Document Details Patient Name: [name], [name] Date of Service: 06/11/2018 2:30 PM Medical Record Number: [id number] Patient Account Number: [id number] Date of Birth/Sex: 06-Mar-1928 (85 y.o. F) Treating RN: Harold Barban Primary Care Provider: Ria Bush Other Clinician: Referring Provider: Ria Bush Treating Provider/Extender: Melburn Hake, HOYT Weeks in Treatment: 11 Information Obtained from: Patient Chief Complaint RLE wounds Electronic Signature(s) Signed: 06/11/2018 4:30:45 PM By: Worthy Keeler PA-C Entered By: Worthy Keeler on 06/11/2018 14:21:04 [name], [name] ([id number]) -------------------------------------------------------------------------------- HPI Details Patient Name: [name], [name] Date of Service: 06/11/2018 2:30 PM Medical Record Number: [id number] Patient Account Number: [id number] Date of Birth/Sex: 1928/01/31 (85 y.o. F) Treating RN: Harold Barban Primary Care Provider: Ria Bush Other Clinician: Referring Provider: Ria Bush Treating Provider/Extender: Melburn Hake, HOYT Weeks in Treatment: 11 History of Present Illness HPI Description: Readmission: 03/23/18 on evaluation today patient presents for readmission entire clinic concerning issues that she has been having for the past couple of weeks in regard to her right lower extremity. The medial injury actually occurred when her dog tried to take a blanket from her and subsequently scratched her with his toenail. Subsequently the lateral injury was a wound where she dropped a knife in her kitchen and hit the side of her leg. With that being said both have been open for some time and home health nurse that has been coming out marked for her where the redness was several days ago. Nonetheless this has spread will be on the margin of what was marked. She was placed on Keflex which was started two days ago  but at this point it does not seem to have helped with any improvement in the overall appearance of the wounds. No fevers, chills, nausea, or vomiting noted at this time. 04/02/18 Seen today for follow up and management of right lower extremity wounds. Right lateral wound with adherent slough. She is complaining of pain to touch which limited exam. Recently treated in the ED for cellulitis and wound culture positive for MRSA. Completed a dose of Vancomycin and transitioned to doxycycline by mouth. Erythema remains present with improvement since last visit. Right medial wound with scab present. No fevers, chills, nausea, or vomiting noted at this time. 04/10/18 on evaluation today patient appears to be doing a Abshier better in regard to her right lateral lower [name]. The slough does seem to be coming off although it is slow from the wound bed. Fortunately there does not appear to be any evidence of infection at this time which is good news. With that being said I do believe that she is going to require longer treatment with the Santyl simply due to the fact that I'm not able to really debride the wound due to the pain that she is experiencing. 04/16/18 upon evaluation today patient's wound bed currently on the lateral lower extremity on the right appears to be doing a Fryer better at this point. We are utilizing sensible currently. Overall I'm very pleased with how things are progressing although there still is some work to do before this will be completely healed. No fevers, chills, nausea, or vomiting noted at this time. 04/23/18 on evaluation today patient appears to be doing about the same at this point maybe just a Secrest bit smaller than last week's evaluation. She continues to have some slough buildup on the surface of the wound and has very Skarda granulation unfortunately. This seems to be very slow moving  I'm getting somewhat concerned about the possibility of her having decreased  blood flow which is leading to her subsequently not being able to heal this area as quickly as she shut otherwise. She is currently utilizing Santyl. 05/01/18 on evaluation today patient appears to be doing somewhat poorly in regard to her lower extremity ulcer. At this point she's been tolerating the dressing changes to some degree without complication. With that being said she does note of the past several days that she has been having more pain unfortunately. On inspection it appears that she may have signs of infection which would account for the pain. 05/07/18 on evaluation today patient's wound actually appears to be doing better she's not having as much erythema in the right lower extremity as compared to last week. Fortunately there's no signs of infection this is good news as well. Overall very pleased with the progress that she has made up to this point. 05/14/18 on evaluation today patient actually appears to be doing rather well in regard to her right lower extremity ulcer. She does not have erythema that was characterizing the cellulitis prior to initiation of antibiotic therapy. With that being said her wound still appears to be somewhat slow in healing I believe this may be arterial in nature that is my concern at least. With that being said I do want her to see the vascular specialist as soon as possible we been trying to get this set up I'm not sure if she's not answering the phone or what exactly is going on here. [name], [name] ([id number]) 05/21/18 on evaluation today patient's wound actually appears to be doing a Mencer better possibly some additional granulation compared to my last evaluation. With that being said she still continues to have a lot of pain especially when her legs are elevated such as sitting in the chair here in the office. There does not appear to be any signs of infection which is good news. 05/28/18 on evaluation today patient appears to be doing a Adebayo  better in regard to her right lateral lower [name]. Again she was supposed to have an appointment with vascular unfortunately they attempted to call her three times and unfortunately were unable to get anything scheduled. That reason closed up for him. She tells me that if the phone is answered and she doesn't recognize what is that she will hang up as well those that she lives with. Obviously she's not gonna recognize somebody calling from the office that she has not previously spoken with. Nonetheless the patient states that she does not remember any fall. I think if we're gonna get her in with vascular specialist we're gonna have to try to make the appointment for her. 06/04/2018 Seen today for follow up and management of right lateral lower extremity wound. Adherent slough present over surface of wound. Scheduled to follow up for ABI/TBI on 06/06/2018 with vein and vascular specialist. She is hard of hearing. Wound is sensitive to touch. No signs or symptoms of infection. Denies any recent fevers, chills, or SOB. 06/11/18 on evaluation today patient actually appears to be doing very well in regard to her right lateral lower extremity ulcer. She's been tolerating the dressing changes without complication. Fortunately there's no evidence of infection. With that being said she did have her arterial studies which revealed that she had a very low ABI which was 0.67 on the right with a TBI of 0.32 and 0.65 on the left with a TBI of 0.16. I  do believe she needs to see vascular and we are working on that referral as well. Electronic Signature(s) Signed: 06/11/2018 4:30:45 PM By: Worthy Keeler PA-C Entered By: Worthy Keeler on 06/11/2018 16:24:43 [name], [name] ([id number]) -------------------------------------------------------------------------------- Physical Exam Details Patient Name: [name], [name] Date of Service: 06/11/2018 2:30 PM Medical Record Number: [id number] Patient Account  Number: [id number] Date of Birth/Sex: [birth year] (85 y.o. F) Treating RN: Harold Barban Primary Care Provider: Ria Bush Other Clinician: Referring Provider: Ria Bush Treating Provider/Extender: STONE III, HOYT Weeks in Treatment: 84 Constitutional Well-nourished and well-hydrated in no acute distress. Respiratory normal breathing without difficulty. clear to auscultation bilaterally. Cardiovascular regular rate and rhythm with normal S1, S2. Psychiatric this patient is able to make decisions and demonstrates good insight into disease process. Alert and Oriented x 3. pleasant and cooperative. Notes Upon evaluation today patient's wound did have some Slough noted in the service of the wound which I was able to mechanically debrided away with saline and gauze. Fortunately there's no evidence of infection at this time she seems to be doing very well in this regard. I'm very pleased. With that being said she does have some Slough remaining although again I did not perform any sharp debridement I do not want to cause any damage to her leg and again with her low ABI's I'm not sure that sharp debridement is in her best interest at this point. Electronic Signature(s) Signed: 06/11/2018 4:30:45 PM By: Worthy Keeler PA-C Entered By: Worthy Keeler on 06/11/2018 16:25:29 [name], [name] ([id number]) -------------------------------------------------------------------------------- Physician Orders Details Patient Name: [name], [name] Date of Service: 06/11/2018 2:30 PM Medical Record Number: [id number] Patient Account Number: [id number] Date of Birth/Sex: 01-Jul-1927 (85 y.o. F) Treating RN: Harold Barban Primary Care Provider: Ria Bush Other Clinician: Referring Provider: Ria Bush Treating Provider/Extender: Melburn Hake, HOYT Weeks in Treatment: 11 Verbal / Phone Orders: No Diagnosis Coding ICD-10 Coding Code Description I87.331 Chronic venous  hypertension (idiopathic) with ulcer and inflammation of right lower extremity E11.622 Type 2 diabetes mellitus with other skin ulcer L97.812 Non-pressure chronic ulcer of other part of right lower leg with fat layer exposed L03.115 Cellulitis of right lower limb Wound Cleansing Wound #8 Right,Lateral Lower Leg o Clean wound with Normal Saline. Anesthetic (add to Medication List) Wound #8 Right,Lateral Lower Leg o Topical Lidocaine 4% cream applied to wound bed prior to debridement (In Clinic Only). Primary Wound Dressing Wound #8 Right,Lateral Lower Leg o Silver Alginate Wound #9 Right,Dorsal Foot o Silver Alginate Secondary Dressing Wound #8 Right,Lateral Lower Leg o ABD and Kerlix/Conform - wrap with conform Wound #9 Right,Dorsal Foot o ABD and Kerlix/Conform - wrap with conform Dressing Change Frequency Wound #8 Right,Lateral Lower Leg o Change dressing every week Wound #9 Right,Dorsal Foot o Change dressing every week Follow-up Appointments Wound #8 Right,Lateral Lower Leg o Return Appointment in 1 week. Edema Control Vestal, Shaylah C. ([id number]) Wound #8 Right,Lateral Lower Leg o Elevate legs to the level of the heart and pump ankles as often as possible o Other: - tubigrip Consults o Vascular - Due to low ABI results Electronic Signature(s) Signed: 06/11/2018 4:30:45 PM By: Worthy Keeler PA-C Signed: 06/11/2018 5:00:09 PM By: Harold Barban Entered By: Harold Barban on 06/11/2018 15:40:55 [name], [name] ([id number]) -------------------------------------------------------------------------------- Problem List Details Patient Name: Baldree, Tawanda C. Date of Service: 06/11/2018 2:30 PM Medical Record Number: [id number] Patient Account Number: [id number] Date of Birth/Sex: [birth year] (85 y.o. F) Treating RN: Harold Barban Primary Care Provider:  Ria Bush Other Clinician: Referring Provider: Ria Bush Treating  Provider/Extender: Melburn Hake, HOYT Weeks in Treatment: 11 Active Problems ICD-10 Evaluated Encounter Code Description Active Date Today Diagnosis I87.331 Chronic venous hypertension (idiopathic) with ulcer and 03/23/2018 No Yes inflammation of right lower extremity E11.622 Type 2 diabetes mellitus with other skin ulcer 03/23/2018 No Yes L97.812 Non-pressure chronic ulcer of other part of right lower leg 03/23/2018 No Yes with fat layer exposed L03.115 Cellulitis of right lower limb 03/23/2018 No Yes Inactive Problems Resolved Problems Electronic Signature(s) Signed: 06/11/2018 4:30:45 PM By: Worthy Keeler PA-C Entered By: Worthy Keeler on 06/11/2018 14:20:57 Spranger, [name] ([id number]) -------------------------------------------------------------------------------- Progress Note Details Patient Name: [name], [name] Date of Service: 06/11/2018 2:30 PM Medical Record Number: [id number] Patient Account Number: [id number] Date of Birth/Sex: 10/28/1927 (85 y.o. F) Treating RN: Harold Barban Primary Care Provider: Ria Bush Other Clinician: Referring Provider: Ria Bush Treating Provider/Extender: Melburn Hake, HOYT Weeks in Treatment: 11 Subjective Chief Complaint Information obtained from Patient RLE wounds History of Present Illness (HPI) Readmission: 03/23/18 on evaluation today patient presents for readmission entire clinic concerning issues that she has been having for the past couple of weeks in regard to her right lower extremity. The medial injury actually occurred when her dog tried to take a blanket from her and subsequently scratched her with his toenail. Subsequently the lateral injury was a wound where she dropped a knife in her kitchen and hit the side of her leg. With that being said both have been open for some time and home health nurse that has been coming out marked for her where the redness was several days ago. Nonetheless this has  spread will be on the margin of what was marked. She was placed on Keflex which was started two days ago but at this point it does not seem to have helped with any improvement in the overall appearance of the wounds. No fevers, chills, nausea, or vomiting noted at this time. 04/02/18 Seen today for follow up and management of right lower extremity wounds. Right lateral wound with adherent slough. She is complaining of pain to touch which limited exam. Recently treated in the ED for cellulitis and wound culture positive for MRSA. Completed a dose of Vancomycin and transitioned to doxycycline by mouth. Erythema remains present with improvement since last visit. Right medial wound with scab present. No fevers, chills, nausea, or vomiting noted at this time. 04/10/18 on evaluation today patient appears to be doing a Smitherman better in regard to her right lateral lower [name]. The slough does seem to be coming off although it is slow from the wound bed. Fortunately there does not appear to be any evidence of infection at this time which is good news. With that being said I do believe that she is going to require longer treatment with the Santyl simply due to the fact that I'm not able to really debride the wound due to the pain that she is experiencing. 04/16/18 upon evaluation today patient's wound bed currently on the lateral lower extremity on the right appears to be doing a Mcfarren better at this point. We are utilizing sensible currently. Overall I'm very pleased with how things are progressing although there still is some work to do before this will be completely healed. No fevers, chills, nausea, or vomiting noted at this time. 04/23/18 on evaluation today patient appears to be doing about the same at this point maybe just a Lusignan bit smaller than last week's  evaluation. She continues to have some slough buildup on the surface of the wound and has very Murdy granulation unfortunately. This  seems to be very slow moving I'm getting somewhat concerned about the possibility of her having decreased blood flow which is leading to her subsequently not being able to heal this area as quickly as she shut otherwise. She is currently utilizing Santyl. 05/01/18 on evaluation today patient appears to be doing somewhat poorly in regard to her lower extremity ulcer. At this point she's been tolerating the dressing changes to some degree without complication. With that being said she does note of the past several days that she has been having more pain unfortunately. On inspection it appears that she may have signs of infection which would account for the pain. 05/07/18 on evaluation today patient's wound actually appears to be doing better she's not having as much erythema in the right lower extremity as compared to last week. Fortunately there's no signs of infection this is good news as well. Overall very pleased with the progress that she has made up to this point. [name], [name] ([id number]) 05/14/18 on evaluation today patient actually appears to be doing rather well in regard to her right lower extremity ulcer. She does not have erythema that was characterizing the cellulitis prior to initiation of antibiotic therapy. With that being said her wound still appears to be somewhat slow in healing I believe this may be arterial in nature that is my concern at least. With that being said I do want her to see the vascular specialist as soon as possible we been trying to get this set up I'm not sure if she's not answering the phone or what exactly is going on here. 05/21/18 on evaluation today patient's wound actually appears to be doing a Hadley better possibly some additional granulation compared to my last evaluation. With that being said she still continues to have a lot of pain especially when her legs are elevated such as sitting in the chair here in the office. There does not appear to be any  signs of infection which is good news. 05/28/18 on evaluation today patient appears to be doing a Polan better in regard to her right lateral lower [name]. Again she was supposed to have an appointment with vascular unfortunately they attempted to call her three times and unfortunately were unable to get anything scheduled. That reason closed up for him. She tells me that if the phone is answered and she doesn't recognize what is that she will hang up as well those that she lives with. Obviously she's not gonna recognize somebody calling from the office that she has not previously spoken with. Nonetheless the patient states that she does not remember any fall. I think if we're gonna get her in with vascular specialist we're gonna have to try to make the appointment for her. 06/04/2018 Seen today for follow up and management of right lateral lower extremity wound. Adherent slough present over surface of wound. Scheduled to follow up for ABI/TBI on 06/06/2018 with vein and vascular specialist. She is hard of hearing. Wound is sensitive to touch. No signs or symptoms of infection. Denies any recent fevers, chills, or SOB. 06/11/18 on evaluation today patient actually appears to be doing very well in regard to her right lateral lower extremity ulcer. She's been tolerating the dressing changes without complication. Fortunately there's no evidence of infection. With that being said she did have her arterial studies which revealed that she  had a very low ABI which was 0.67 on the right with a TBI of 0.32 and 0.65 on the left with a TBI of 0.16. I do believe she needs to see vascular and we are working on that referral as well. Patient History Information obtained from Patient. Family History Diabetes - Father,Siblings,Child, Hypertension - Child, Lung Disease - Child, Stroke - Mother, No family history of Cancer, Heart Disease, Kidney Disease, Seizures, Thyroid Problems. Social History Former smoker,  Marital Status - Single, Alcohol Use - Never, Drug Use - No History, Caffeine Use - Daily. Medical History Eyes Patient has history of Cataracts - removed Denies history of Glaucoma, Optic Neuritis Hematologic/Lymphatic Patient has history of Anemia Denies history of Hemophilia, Human Immunodeficiency Virus, Lymphedema, Sickle Cell Disease Respiratory Denies history of Aspiration, Asthma, Chronic Obstructive Pulmonary Disease (COPD), Pneumothorax, Sleep Apnea, Tuberculosis Cardiovascular Patient has history of Arrhythmia, Coronary Artery Disease, Hypertension, Myocardial Infarction Denies history of Angina, Congestive Heart Failure, Deep Vein Thrombosis, Hypotension, Peripheral Arterial Disease, Peripheral Venous Disease, Phlebitis Endocrine Patient has history of Type II Diabetes Denies history of Type I Diabetes Genitourinary Denies history of End Stage Renal Disease Immunological Merrow, Christena C. ([id number]) Denies history of Lupus Erythematosus, Raynaud s, Scleroderma Integumentary (Skin) Denies history of History of Burn, History of pressure wounds Musculoskeletal Patient has history of Osteoarthritis Denies history of Gout, Rheumatoid Arthritis, Osteomyelitis Neurologic Denies history of Dementia, Neuropathy, Quadriplegia, Paraplegia, Seizure Disorder Oncologic Denies history of Received Chemotherapy, Received Radiation Medical And Surgical History Notes Constitutional Symptoms (General Health) High Blood Pressure, open heart surgery, gall stone, hysterectomy; Fluid pill, Anxiety Review of Systems (ROS) Constitutional Symptoms (General Health) Denies complaints or symptoms of Fever, Chills. Respiratory The patient has no complaints or symptoms. Cardiovascular Complains or has symptoms of LE edema. Psychiatric The patient has no complaints or symptoms. Objective Constitutional Well-nourished and well-hydrated in no acute distress. Vitals Time Taken: 3:05 PM,  Height: 58 in, Weight: 140 lbs, BMI: 29.3, Temperature: 97.9 F, Pulse: 79 bpm, Respiratory Rate: 18 breaths/min, Blood Pressure: 113/63 mmHg. Respiratory normal breathing without difficulty. clear to auscultation bilaterally. Cardiovascular regular rate and rhythm with normal S1, S2. Psychiatric this patient is able to make decisions and demonstrates good insight into disease process. Alert and Oriented x 3. pleasant and cooperative. General Notes: Upon evaluation today patient's wound did have some Slough noted in the service of the wound which I was able to mechanically debrided away with saline and gauze. Fortunately there's no evidence of infection at this time she seems to be doing very well in this regard. I'm very pleased. With that being said she does have some Slough remaining although again I did not perform any sharp debridement I do not want to cause any damage to her leg and again with her low ABI's I'm not sure that sharp debridement is in her best interest at this point. [name], [name] ([id number]) Integumentary (Hair, Skin) Wound #8 status is Open. Original cause of wound was Trauma. The wound is located on the Right,Lateral Lower Leg. The wound measures 3.7cm length x 1.4cm width x 0.2cm depth; 4.068cm^2 area and 0.814cm^3 volume. There is Fat Layer (Subcutaneous Tissue) Exposed exposed. There is no tunneling or undermining noted. There is a medium amount of purulent drainage noted. The wound margin is flat and intact. There is no granulation within the wound bed. There is a large (67-100%) amount of necrotic tissue within the wound bed including Adherent Slough. The periwound skin appearance exhibited: Excoriation, Dry/Scaly, Erythema.  The periwound skin appearance did not exhibit: Callus, Crepitus, Induration, Rash, Scarring, Maceration, Atrophie Blanche, Cyanosis, Ecchymosis, Hemosiderin Staining, Mottled, Pallor, Rubor. The surrounding wound skin color is noted with  erythema which is circumferential. Periwound temperature was noted as No Abnormality. The periwound has tenderness on palpation. Wound #9 status is Open. Original cause of wound was Trauma. The wound is located on the Right,Dorsal Foot. The wound measures 0.3cm length x 0.6cm width x 0.1cm depth; 0.141cm^2 area and 0.014cm^3 volume. The wound is limited to skin breakdown. There is no tunneling or undermining noted. There is a none present amount of drainage noted. The wound margin is flat and intact. There is no granulation within the wound bed. There is a large (67-100%) amount of necrotic tissue within the wound bed including Eschar. The periwound skin appearance did not exhibit: Callus, Crepitus, Excoriation, Induration, Rash, Scarring, Dry/Scaly, Maceration, Atrophie Blanche, Cyanosis, Ecchymosis, Hemosiderin Staining, Mottled, Pallor, Rubor, Erythema. Periwound temperature was noted as No Abnormality. The periwound has tenderness on palpation. Assessment Active Problems ICD-10 Chronic venous hypertension (idiopathic) with ulcer and inflammation of right lower extremity Type 2 diabetes mellitus with other skin ulcer Non-pressure chronic ulcer of other part of right lower leg with fat layer exposed Cellulitis of right lower limb Plan Wound Cleansing: Wound #8 Right,Lateral Lower Leg: Clean wound with Normal Saline. Anesthetic (add to Medication List): Wound #8 Right,Lateral Lower Leg: Topical Lidocaine 4% cream applied to wound bed prior to debridement (In Clinic Only). Primary Wound Dressing: Wound #8 Right,Lateral Lower Leg: Silver Alginate Wound #9 Right,Dorsal Foot: Silver Alginate Secondary Dressing: Wound #8 Right,Lateral Lower Leg: ABD and Kerlix/Conform - wrap with conform Wound #9 Right,Dorsal Foot: ABD and Kerlix/Conform - wrap with conform Dressing Change Frequency: Wound #8 Right,Lateral Lower Leg: Change dressing every week [name], Jacquilyn C. ([id number]) Wound #9  Right,Dorsal Foot: Change dressing every week Follow-up Appointments: Wound #8 Right,Lateral Lower Leg: Return Appointment in 1 week. Edema Control: Wound #8 Right,Lateral Lower Leg: Elevate legs to the level of the heart and pump ankles as often as possible Other: - tubigrip Consults ordered were: Vascular - Due to low ABI results We will continue with the above wound care measures for the next week and the patient is in agreement with plan. We will subtly see were things in at follow-up. Anything changes in the meantime she let me know. We are gonna work on making a referral for vascular evaluation to see what they can possibly do to help improve her blood flow and we will get the information sent over to them ASAP. Please see above for specific wound care orders. We will see patient for re-evaluation in 1 week(s) here in the clinic. If anything worsens or changes patient will contact our office for additional recommendations. Electronic Signature(s) Signed: 06/11/2018 4:30:45 PM By: Worthy Keeler PA-C Entered By: Worthy Keeler on 06/11/2018 16:26:04 Winch, [name] ([id number]) -------------------------------------------------------------------------------- ROS/PFSH Details Patient Name: Tapia, Laurelai C. Date of Service: 06/11/2018 2:30 PM Medical Record Number: [id number] Patient Account Number: [id number] Date of Birth/Sex: [birth year] (85 y.o. F) Treating RN: Harold Barban Primary Care Provider: Ria Bush Other Clinician: Referring Provider: Ria Bush Treating Provider/Extender: Melburn Hake, HOYT Weeks in Treatment: 11 Information Obtained From Patient Constitutional Symptoms (General Health) Complaints and Symptoms: Negative for: Fever; Chills Medical History: Past Medical History Notes: High Blood Pressure, open heart surgery, gall stone, hysterectomy; Fluid pill, Anxiety Cardiovascular Complaints and Symptoms: Positive for: LE edema Medical  History: Positive for: Arrhythmia; Coronary Artery  Disease; Hypertension; Myocardial Infarction Negative for: Angina; Congestive Heart Failure; Deep Vein Thrombosis; Hypotension; Peripheral Arterial Disease; Peripheral Venous Disease; Phlebitis Eyes Medical History: Positive for: Cataracts - removed Negative for: Glaucoma; Optic Neuritis Hematologic/Lymphatic Medical History: Positive for: Anemia Negative for: Hemophilia; Human Immunodeficiency Virus; Lymphedema; Sickle Cell Disease Respiratory Complaints and Symptoms: No Complaints or Symptoms Medical History: Negative for: Aspiration; Asthma; Chronic Obstructive Pulmonary Disease (COPD); Pneumothorax; Sleep Apnea; Tuberculosis Endocrine Medical History: Positive for: Type II Diabetes Negative for: Type I Diabetes Time with diabetes: no idea Treated with: Oral agents Signorelli, Brylei C. ([id number]) Blood sugar tested every day: No Genitourinary Medical History: Negative for: End Stage Renal Disease Immunological Medical History: Negative for: Lupus Erythematosus; Raynaudos; Scleroderma Integumentary (Skin) Medical History: Negative for: History of Burn; History of pressure wounds Musculoskeletal Medical History: Positive for: Osteoarthritis Negative for: Gout; Rheumatoid Arthritis; Osteomyelitis Neurologic Medical History: Negative for: Dementia; Neuropathy; Quadriplegia; Paraplegia; Seizure Disorder Oncologic Medical History: Negative for: Received Chemotherapy; Received Radiation Psychiatric Complaints and Symptoms: No Complaints or Symptoms HBO Extended History Items Eyes: Cataracts Immunizations Pneumococcal Vaccine: Received Pneumococcal Vaccination: Yes Implantable Devices No devices added Family and Social History Cancer: No; Diabetes: Yes - Father,Siblings,Child; Heart Disease: No; Hypertension: Yes - Child; Kidney Disease: No; Lung Disease: Yes - Child; Seizures: No; Stroke: Yes - Mother; Thyroid  Problems: No; Former smoker; Marital Status - Single; Alcohol Use: Never; Drug Use: No History; Caffeine Use: Daily; Financial Concerns: No; Food, Clothing or Shelter Needs: No; Support System Lacking: No; Transportation Concerns: No; Advanced Directives: Yes (Not Provided); Patient does not want information on Advanced Directives; Living Will: Yes (Not Provided) Physician Affirmation I have reviewed and agree with the above information. RITAMARIE, ARKIN ([id number]) Electronic Signature(s) Signed: 06/11/2018 4:30:45 PM By: Worthy Keeler PA-C Signed: 06/11/2018 5:00:09 PM By: Harold Barban Entered By: Worthy Keeler on 06/11/2018 16:25:01 Toelle, [name] ([id number]) -------------------------------------------------------------------------------- SuperBill Details Patient Name: Dimitrov, Elisandra C. Date of Service: 06/11/2018 Medical Record Number: [id number] Patient Account Number: [id number] Date of Birth/Sex: [birth year] (85 y.o. F) Treating RN: Harold Barban Primary Care Provider: Ria Bush Other Clinician: Referring Provider: Ria Bush Treating Provider/Extender: Melburn Hake, HOYT Weeks in Treatment: 11 Diagnosis Coding ICD-10 Codes Code Description I87.331 Chronic venous hypertension (idiopathic) with ulcer and inflammation of right lower extremity E11.622 Type 2 diabetes mellitus with other skin ulcer L97.812 Non-pressure chronic ulcer of other part of right lower leg with fat layer exposed L03.115 Cellulitis of right lower limb Facility Procedures CPT4 Code: 23300762 Description: 99213 - WOUND CARE VISIT-LEV 3 EST PT Modifier: Quantity: 1 Physician Procedures CPT4: Description Modifier Quantity Code 2633354 99214 - WC PHYS LEVEL 4 - EST PT 1 ICD-10 Diagnosis Description I87.331 Chronic venous hypertension (idiopathic) with ulcer and inflammation of right lower extremity E11.622 Type 2 diabetes mellitus with  other skin ulcer L97.812 Non-pressure chronic ulcer  of other part of right lower leg with fat layer exposed L03.115 Cellulitis of right lower limb Electronic Signature(s) Signed: 06/11/2018 4:30:45 PM By: Worthy Keeler PA-C Entered By: Worthy Keeler on 06/11/2018 16:26:15

## 2018-06-18 ENCOUNTER — Other Ambulatory Visit: Payer: Self-pay

## 2018-06-18 ENCOUNTER — Ambulatory Visit (INDEPENDENT_AMBULATORY_CARE_PROVIDER_SITE_OTHER): Payer: Medicare Other | Admitting: Family Medicine

## 2018-06-18 ENCOUNTER — Telehealth: Payer: Self-pay | Admitting: Family Medicine

## 2018-06-18 ENCOUNTER — Ambulatory Visit (INDEPENDENT_AMBULATORY_CARE_PROVIDER_SITE_OTHER)
Admission: RE | Admit: 2018-06-18 | Discharge: 2018-06-18 | Disposition: A | Discharge status: Home / Self Care | Payer: Medicare Other | Source: Ambulatory Visit | Attending: Family Medicine | Admitting: Family Medicine

## 2018-06-18 ENCOUNTER — Ambulatory Visit: Payer: Medicare Other | Admitting: Physician Assistant

## 2018-06-18 ENCOUNTER — Encounter: Payer: Self-pay | Admitting: Family Medicine

## 2018-06-18 VITALS — BP 138/78 | HR 77 | Temp 97.6°F | Ht <= 58 in | Wt 137.0 lb

## 2018-06-18 DIAGNOSIS — I2510 Atherosclerotic heart disease of native coronary artery without angina pectoris: Secondary | ICD-10-CM | POA: Diagnosis not present

## 2018-06-18 DIAGNOSIS — K5909 Other constipation: Secondary | ICD-10-CM | POA: Diagnosis not present

## 2018-06-18 DIAGNOSIS — R1030 Lower abdominal pain, unspecified: Secondary | ICD-10-CM

## 2018-06-18 DIAGNOSIS — K5900 Constipation, unspecified: Secondary | ICD-10-CM | POA: Diagnosis not present

## 2018-06-18 DIAGNOSIS — G8929 Other chronic pain: Secondary | ICD-10-CM | POA: Diagnosis not present

## 2018-06-18 MED ORDER — HYDROCODONE-ACETAMINOPHEN 5-325 MG PO TABS: 1.0000 | ORAL_TABLET | Freq: Three times a day (TID) | ORAL | 0 refills | Status: DC | PRN

## 2018-06-18 MED ORDER — POLYETHYLENE GLYCOL 3350 17 GM/SCOOP PO POWD: 17.0000 g | Freq: Every day | ORAL | 1 refills | Status: DC

## 2018-06-18 NOTE — Telephone Encounter (Signed)
Name of Medication: Hydrocodone-APAP Name of Pharmacy: [facility] Last Fill or Written Date and Quantity: 05/16/18, #90 Last Office Visit and Type: 04/27/18, acute Next Office Visit and Type: today, 06/18/18 Last Controlled Substance Agreement Date: 10/11/17 Last UDS: 10/11/17

## 2018-06-18 NOTE — Assessment & Plan Note (Addendum)
[place] CSRS reviewed Recommend she decrease narcotic dosing back to 1/2 tab QID as previously. Will refill 1 more month at #90.

## 2018-06-18 NOTE — Telephone Encounter (Signed)
Patient said she called last Friday to get a refill on her Hydrocodone.  Patient said she's been out of the medication since Friday.  Patient said she hasn't slept in two nights and she's having severe stomach pains.  Patient said she took Tylenol last night, but it didn't help.  Patient uses [facility].

## 2018-06-18 NOTE — Patient Instructions (Signed)
Take sennokot daily Add miralax 1 capful (17gm) in 8 oz of water daily, hold for diarrhea.  Don't take imodium at this time.  Let us know if not improving with this.  Try to decrease hydrocodone back to 1/2 tablet four times a day (instead of 1 tablet 3 times a day).

## 2018-06-18 NOTE — Progress Notes (Addendum)
BP 138/78 (BP Location: Right Arm, Cuff Size: Large)   Pulse 77   Temp 97.6 F (36.4 C) (Oral)   Ht 4\' 9"  (1.448 m)   Wt 137 lb (62.1 kg)   SpO2 96%   BMI 29.65 kg/m    CC: constipation Subjective:    [name], female    DOB: [birth year], 85 y.o.   MRN: [id number]  HPI: [name] is a 85 y.o. female presenting on 06/18/2018 for Constipation (C/o constipation and lower abd pain. States she finally had BM yesterday. Says it has been off and on for about 4 mos. )   4 month history of off and on constipation with lower abdominal pain. Ate some cereal this morning, vomited it back up. Nauseated. She has been doing manual disimpactions as well as oil enemas (with syringe). Yesterday had BM but still feels full. Ongoing lower back pain.   No fevers/chills, diarrhea.  She has been passing gas.  No known h/o diverticulitis.   BP elevated today - attributes to increased pain as she's ran out of narcotic since last week.   Current bowel regimen is senokot 1 tab daily. She has not been taking align probiotic.   She did have narcotic dose increased last year due to worsening lower back pain - now taking hydrocodone 5/325mg  TID (previously 1/2 tab QID dosing).   Verified not taking imodium. Took tylenol last night.      Relevant past medical, surgical, family and social history reviewed and updated as indicated. Interim medical history since our last visit reviewed. Allergies and medications reviewed and updated. Outpatient Medications Prior to Visit  Medication Sig Dispense Refill  . acetaminophen (TYLENOL) 500 MG tablet Take 500 mg by mouth every 6 (six) hours as needed for mild pain.    Marland Kitchen ALPRAZolam (XANAX) 1 MG tablet Take 0.5 mg by mouth 4 (four) times daily as needed for anxiety.     Marland Kitchen aspirin EC 81 MG tablet Take 81 mg by mouth daily.    . cephALEXin (KEFLEX) 500 MG capsule Take 1 capsule (500 mg total) by mouth 3 (three) times daily. 21 capsule 0  .  collagenase (SANTYL) ointment Apply topically daily. 15 g 0  . diclofenac sodium (VOLTAREN) 1 % GEL Apply 1 application topically 3 (three) times daily. 1 Tube 1  . famotidine (PEPCID) 20 MG tablet Take 1 tablet (20 mg total) by mouth at bedtime.    . furosemide (LASIX) 20 MG tablet TAKE 0.5 TABLETS (10 MG TOTAL) BY MOUTH DAILY. 45 tablet 3  . glucosamine-chondroitin 500-400 MG tablet Take 1 tablet by mouth daily.     Marland Kitchen glucose blood (ONE TOUCH ULTRA TEST) test strip USE AS INSTRUCTED TO CHECK BLOOD SUGAR ONCE A DAY Dx code: E11.9 50 each 5  . HYDROcodone-acetaminophen (NORCO/VICODIN) 5-325 MG tablet Take 0.5 tablets by mouth every 6 (six) hours as needed for moderate pain.    Marland Kitchen loperamide (IMODIUM) 2 MG capsule Take 2 mg by mouth 4 (four) times daily as needed for diarrhea or loose stools.    Marland Kitchen loratadine (CLARITIN) 10 MG tablet Take 10 mg by mouth daily as needed for allergies.    . metoprolol tartrate (LOPRESSOR) 25 MG tablet TAKE ONE AND HALF TABLETS (37.5 MG TOTAL) BY MOUTH 2 (TWO) TIMES DAILY. 270 tablet 1  . Multiple Vitamin (MULTIVITAMIN) tablet Take 1 tablet by mouth daily. Patient reports takes a new vitamin to replace her centrum as recommended by her  eye doctor.    . nitroGLYCERIN (NITROSTAT) 0.4 MG SL tablet Place 1 tablet (0.4 mg total) under the tongue every 5 (five) minutes as needed for chest pain. 25 tablet 3  . nystatin-triamcinolone ointment (MYCOLOG) APPLY 1 APPLICATION TOPICALLY 2 (TWO) TIMES DAILY. AS NEEDED FOR ITCHING 60 g 1  . ONETOUCH VERIO test strip USE TO TEST BLOOD SUGAR ONCE DAILY 100 each 6  . pantoprazole (PROTONIX) 40 MG tablet Take 1 tablet (40 mg total) by mouth daily. 30 tablet 3  . pioglitazone (ACTOS) 30 MG tablet TAKE 1 TABLET BY MOUTH EVERY DAY 90 tablet 3  . pravastatin (PRAVACHOL) 20 MG tablet TAKE 1 TABLET BY MOUTH EVERY DAY 90 tablet 3  . Probiotic Product (ALIGN) 4 MG CAPS Take 1 capsule (4 mg total) by mouth daily.    . ramipril (ALTACE) 5 MG capsule  TAKE ONE CAPSULE BY MOUTH EVERYDAY FOR BLOOD PRESSURE 90 capsule 1  . senna-docusate (SENOKOT-S) 8.6-50 MG tablet Take 1 tablet by mouth daily as needed for moderate constipation. 30 tablet 0  . sitaGLIPtin (JANUVIA) 100 MG tablet Take 1 tablet (100 mg total) by mouth daily. (Patient taking differently: Take 100 mg by mouth daily. Takes 1/2 tablet) 30 tablet 2  . traZODone (DESYREL) 150 MG tablet Take 150 mg by mouth at bedtime.     . triamcinolone cream (KENALOG) 0.1 % APPLY 1 APPLICATION TOPICALLY 2 (TWO) TIMES DAILY. 45 g 1  . vitamin B-12 (CYANOCOBALAMIN) 1000 MCG tablet Take 1,000 mcg by mouth daily.    . vitamin E 400 UNIT capsule Take 400 Units by mouth daily.    Marland Kitchen HYDROcodone-acetaminophen (NORCO/VICODIN) 5-325 MG tablet Take 1 tablet by mouth 3 (three) times daily as needed for moderate pain. 90 tablet 0   No facility-administered medications prior to visit.      Per HPI unless specifically indicated in ROS section below Review of Systems Objective:    BP 138/78 (BP Location: Right Arm, Cuff Size: Large)   Pulse 77   Temp 97.6 F (36.4 C) (Oral)   Ht 4\' 9"  (1.448 m)   Wt 137 lb (62.1 kg)   SpO2 96%   BMI 29.65 kg/m   Wt Readings from Last 3 Encounters:  06/18/18 137 lb (62.1 kg)  04/27/18 140 lb 8 oz (63.7 kg)  04/18/18 141 lb 12 oz (64.3 kg)    Physical Exam Vitals signs and nursing note reviewed.  Constitutional:      Appearance: Normal appearance. She is not toxic-appearing.     Comments: Tired appearing  HENT:     Head: Normocephalic and atraumatic.     Mouth/Throat:     Mouth: Mucous membranes are moist.     Pharynx: Oropharynx is clear. No posterior oropharyngeal erythema.  Eyes:     Extraocular Movements: Extraocular movements intact.     Pupils: Pupils are equal, round, and reactive to light.  Cardiovascular:     Rate and Rhythm: Normal rate and regular rhythm.     Pulses: Normal pulses.     Heart sounds: Normal heart sounds. No murmur.  Pulmonary:      Effort: Pulmonary effort is normal. No respiratory distress.     Breath sounds: Normal breath sounds. No wheezing, rhonchi or rales.  Abdominal:     General: Bowel sounds are normal. There is no distension.     Palpations: Abdomen is soft. There is no mass.     Tenderness: There is generalized abdominal tenderness (mod). There is no  guarding or rebound.     Hernia: No hernia is present.  Neurological:     Mental Status: She is alert.       Assessment & Plan:   Problem List Items Addressed This Visit    Encounter for chronic pain management    [place] CSRS reviewed Recommend she decrease narcotic dosing back to 1/2 tab QID as previously. Will refill 1 more month at #90.       Chronic constipation - Primary    Ongoing for months, may correlate to increase in narcotic dosing last year. Rec start senna daily, start miralax 17gm daily, reviewed importance of good water intake. Check 2 view abd films today. Non acute abdominal exam today. Recommend she decrease hydrocodone from 1 TID to previous 1/2 QID dosing.       Relevant Medications   polyethylene glycol powder (GLYCOLAX/MIRALAX) powder   Other Relevant Orders   DG Abd 2 Views   Abdominal pain    Chronic, anticipate current acute flare is due to worsening constipation. Check 2 view abd r/o obstruction picture. See below. Red flags to seek further care reviewed. I asked her to call us with update in 2-3 days .       Relevant Orders   DG Abd 2 Views       Meds ordered this encounter  Medications  . DISCONTD: HYDROcodone-acetaminophen (NORCO/VICODIN) 5-325 MG tablet    Sig: Take 1 tablet by mouth 3 (three) times daily as needed for moderate pain.    Dispense:  90 tablet    Refill:  0  . polyethylene glycol powder (GLYCOLAX/MIRALAX) powder    Sig: Take 17 g by mouth daily. Hold for diarrhea    Dispense:  3350 g    Refill:  1   Orders Placed This Encounter  Procedures  . DG Abd 2 Views    Standing Status:   Future    Number  of Occurrences:   1    Standing Expiration Date:   08/18/2019    Order Specific Question:   Reason for Exam (SYMPTOM  OR DIAGNOSIS REQUIRED)    Answer:   constipation, vomiting, abd pain    Order Specific Question:   Preferred imaging location?    Answer:   [facility]    Order Specific Question:   Radiology Contrast Protocol - do NOT remove file path    Answer:   \\[email or web address]    Follow up plan: Return if symptoms worsen or fail to improve.  Ria Bush, MD

## 2018-06-18 NOTE — Telephone Encounter (Signed)
Do not see note where pt called in on Thursday or Fri requesting Hydrocodone. Pt has appt 06/18/18 at 11:30 to see Dr Danise Mina. FYI to Dr Danise Mina.

## 2018-06-18 NOTE — Assessment & Plan Note (Signed)
Ongoing for months, may correlate to increase in narcotic dosing last year. Rec start senna daily, start miralax 17gm daily, reviewed importance of good water intake. Check 2 view abd films today. Non acute abdominal exam today. Recommend she decrease hydrocodone from 1 TID to previous 1/2 QID dosing.

## 2018-06-18 NOTE — Assessment & Plan Note (Addendum)
Chronic, anticipate current acute flare is due to worsening constipation. Check 2 view abd r/o obstruction picture. See below. Red flags to seek further care reviewed. I asked her to call us with update in 2-3 days .

## 2018-06-18 NOTE — Telephone Encounter (Signed)
[facility] Call Center Patient Name: [name] Gender: Female DOB: 06/07/1937 Age: 85 Y 56 D Return Phone Number: [phone] (Primary) Address: City/State/Zip: [place] [address] Client [place] Night - Client Client Site [place] Physician Ria Bush - MD Contact Type Call Who Is Calling Patient / Member / Family / Caregiver Call Type Triage / Clinical Relationship To Patient Self Return Phone Number [phone] (Primary) Chief Complaint Leg Pain Reason for Call Symptomatic / Request for [place] states pharmacy has not received medication. Is out medication. Bad leg pain, needs the medication[facility] Translation No Nurse Assessment Guidelines Guideline Title Affirmed Question Affirmed Notes Nurse Date/Time (Eastern Time) Disp. Time Eilene Ghazi Time) Disposition Final User 06/16/2018 10:32:37 AM Attempt made - line busy Rowlett, RN, [facility] 06/16/2018 10:43:08 AM FINAL ATTEMPT MADE - no message left Yes Newhart, RN, Beth Comments User: Louretta Shorten, RN Date/Time Eilene Ghazi Time): 06/16/2018 10:43:41 AM line busy on both attempts. Only one phone listed.

## 2018-06-18 NOTE — Telephone Encounter (Signed)
[facility] Call Center Patient Name: [name] Gender: Female DOB: [birth year] Age: 85 Y 72 D Return Phone Number: [phone] (Primary) Address: City/State/Zip: [place] [address] Client [place] Night - Client Client Site [place] Physician Ria Bush - MD Contact Type Call Who Is Calling Patient / Member / Family / Caregiver Call Type Triage / Clinical Relationship To Patient Self Return Phone Number [phone] (Primary) Chief Complaint Leg Pain Reason for Call Medication Question / Request Initial Comment Caller is needing a refill. Pharmacy will not refill and she is in pain due to being out. Translation No No Triage Reason Patient declined Nurse Assessment Nurse: Hassell Done, RN, Melanie Date/Time Eilene Ghazi Time): 06/16/2018 2:21:08 PM Confirm and document reason for call. If symptomatic, describe symptoms. ---Caller states she needs a refill for hydrocodone for pain. She says she takes it for pain in her leg. states she called the Dr [facility] on Thursday and [facility]. States she called and the medicine had not been called in. Caller denies need for triage of her leg. Has the patient traveled to [place], [place], [place], [place], or [place] OR had close contact with a person known to have the novel coronavirus illness in the last 14 days? ---Not Applicable Does the patient have any new or worsening symptoms? ---Yes Will a triage be completed? ---No Select reason for no triage. ---Patient declined Guidelines Guideline Title Affirmed Question Affirmed Notes Nurse Date/Time (Eastern Time) Disp. Time Eilene Ghazi Time) Disposition Final User 06/16/2018 2:26:08 PM Clinical Call Yes Hassell Done, RN, Threasa Beards

## 2018-06-19 ENCOUNTER — Telehealth: Payer: Self-pay | Admitting: Family Medicine

## 2018-06-19 NOTE — Telephone Encounter (Signed)
Pt returned call. Relayed results and instructions per Dr. Darnell Level.  Pt verbalizes understanding.

## 2018-06-19 NOTE — Telephone Encounter (Signed)
Attempted to contact pt. Call was picked up, could hear voices but no one answered when I said, "Hello." Call was disconnected. Tried to call back, no answer and vm box is not set up.

## 2018-06-19 NOTE — Telephone Encounter (Signed)
plz notify - xray returned showing significant constipation but reassuringly no signs of obstruction. Start miralax and sennokot daily for good bowel movement.

## 2018-06-21 ENCOUNTER — Emergency Department
Admission: EM | Admit: 2018-06-21 | Discharge: 2018-06-21 | Disposition: A | Discharge status: Home / Self Care | Payer: Medicare Other | Attending: Emergency Medicine | Admitting: Emergency Medicine

## 2018-06-21 ENCOUNTER — Encounter: Payer: Medicare Other | Admitting: Physician Assistant

## 2018-06-21 ENCOUNTER — Encounter: Payer: Self-pay | Admitting: Emergency Medicine

## 2018-06-21 ENCOUNTER — Other Ambulatory Visit: Payer: Self-pay

## 2018-06-21 DIAGNOSIS — L97812 Non-pressure chronic ulcer of other part of right lower leg with fat layer exposed: Secondary | ICD-10-CM | POA: Diagnosis not present

## 2018-06-21 DIAGNOSIS — R509 Fever, unspecified: Secondary | ICD-10-CM | POA: Diagnosis not present

## 2018-06-21 DIAGNOSIS — L97511 Non-pressure chronic ulcer of other part of right foot limited to breakdown of skin: Secondary | ICD-10-CM | POA: Diagnosis not present

## 2018-06-21 DIAGNOSIS — Z5321 Procedure and treatment not carried out due to patient leaving prior to being seen by health care provider: Secondary | ICD-10-CM | POA: Insufficient documentation

## 2018-06-21 LAB — LIPASE, BLOOD: Lipase: 27 U/L (ref 11–51)

## 2018-06-21 LAB — CBC
HCT: 40.9 % (ref 36.0–46.0)
Hemoglobin: 13.2 g/dL (ref 12.0–15.0)
MCH: 30.5 pg (ref 26.0–34.0)
MCHC: 32.3 g/dL (ref 30.0–36.0)
MCV: 94.5 fL (ref 80.0–100.0)
Platelets: 233 10*3/uL (ref 150–400)
RBC: 4.33 MIL/uL (ref 3.87–5.11)
RDW: 13.7 % (ref 11.5–15.5)
WBC: 15.5 10*3/uL — ABNORMAL HIGH (ref 4.0–10.5)
nRBC: 0 % (ref 0.0–0.2)

## 2018-06-21 LAB — COMPREHENSIVE METABOLIC PANEL
ALT: 17 U/L (ref 0–44)
AST: 25 U/L (ref 15–41)
Albumin: 3.5 g/dL (ref 3.5–5.0)
Alkaline Phosphatase: 96 U/L (ref 38–126)
Anion gap: 10 (ref 5–15)
BUN: 37 mg/dL — ABNORMAL HIGH (ref 8–23)
CO2: 23 mmol/L (ref 22–32)
Calcium: 8.9 mg/dL (ref 8.9–10.3)
Chloride: 102 mmol/L (ref 98–111)
Creatinine, Ser: 1.91 mg/dL — ABNORMAL HIGH (ref 0.44–1.00)
GFR calc Af Amer: 26 mL/min — ABNORMAL LOW (ref >60)
GFR calc non Af Amer: 23 mL/min — ABNORMAL LOW (ref >60)
Glucose, Bld: 199 mg/dL — ABNORMAL HIGH (ref 70–99)
Potassium: 4.2 mmol/L (ref 3.5–5.1)
Sodium: 135 mmol/L (ref 135–145)
TOTAL PROTEIN: 6.7 g/dL (ref 6.5–8.1)
Total Bilirubin: 0.7 mg/dL (ref 0.3–1.2)

## 2018-06-21 MED ORDER — SODIUM CHLORIDE 0.9% FLUSH: 3.0000 mL | Freq: Once | INTRAVENOUS | Status: DC

## 2018-06-21 NOTE — ED Triage Notes (Signed)
PT to ED with daughter, states she was sent over from wound center to rule out UTI. Pt c/o flank pain, denies dysuria. PT A&OX4 . PT in NAD

## 2018-06-21 NOTE — ED Notes (Signed)
Pt given a warm blanket 

## 2018-06-21 NOTE — ED Notes (Signed)
Daughter to desk reporting pts anxiety is getting too high and she will need to leave. Pt reports she will monitor her symptoms.

## 2018-06-22 NOTE — Telephone Encounter (Signed)
Left message for pt's granddaughter, [name] (on dpr), to call back.  Need to relay Dr. Synthia Innocent message.

## 2018-06-22 NOTE — Telephone Encounter (Signed)
plz call niece - Is she having better bowel movements?  I saw she went to ER yesterday but left before being seen. Labs returned showing worsened kidney function - needs to increase fluid intake and if unable to do PO will need to return to ER for IV fluids.

## 2018-06-22 NOTE — Telephone Encounter (Signed)
Left detailed message on voicemail of Susie, granddaughter. (DPR)

## 2018-06-22 NOTE — Progress Notes (Signed)
AMARILIS, BELFLOWER ([id number]) Visit Report for 06/21/2018 Arrival Information Details Patient Name: Grullon, Sirenia C. Date of Service: 06/21/2018 2:00 PM Medical Record Number: [id number] Patient Account Number: [id number] Date of Birth/Sex: 26-Jan-1928 (85 y.o. F) Treating RN: Montey Hora Primary Care Yago Ludvigsen: Ria Bush Other Clinician: Referring Eldoris Beiser: Ria Bush Treating Bretton Tandy/Extender: Melburn Hake, HOYT Weeks in Treatment: 12 Visit Information History Since Last Visit Added or deleted any medications: No Patient Arrived: Wheel Chair Any new allergies or adverse reactions: No Arrival Time: 14:16 Had a fall or experienced change in No Accompanied By: self activities of daily living that may affect Transfer Assistance: None risk of falls: Patient Identification Verified: Yes Signs or symptoms of abuse/neglect since last visito No Secondary Verification Process Completed: Yes Hospitalized since last visit: No Implantable device outside of the clinic excluding No cellular tissue based products placed in the center since last visit: Has Dressing in Place as Prescribed: Yes Pain Present Now: Yes Electronic Signature(s) Signed: 06/21/2018 4:49:35 PM By: Montey Hora Entered By: Montey Hora on 06/21/2018 14:17:57 Wollenberg, [name] ([id number]) -------------------------------------------------------------------------------- Clinic Level of Care Assessment Details Patient Name: Kirchner, Nashira C. Date of Service: 06/21/2018 2:00 PM Medical Record Number: [id number] Patient Account Number: [id number] Date of Birth/Sex: [birth year] (85 y.o. F) Treating RN: Army Melia Primary Care Hydee Fleece: Ria Bush Other Clinician: Referring Taiven Greenley: Ria Bush Treating Bret Stamour/Extender: Melburn Hake, HOYT Weeks in Treatment: 12 Clinic Level of Care Assessment Items TOOL 4 Quantity Score []  - Use when only an EandM is performed on FOLLOW-UP visit  0 ASSESSMENTS - Nursing Assessment / Reassessment X - Reassessment of Co-morbidities (includes updates in patient status) 1 10 X- 1 5 Reassessment of Adherence to Treatment Plan ASSESSMENTS - Wound and Skin Assessment / Reassessment []  - Simple Wound Assessment / Reassessment - one wound 0 X- 2 5 Complex Wound Assessment / Reassessment - multiple wounds []  - 0 Dermatologic / Skin Assessment (not related to wound area) ASSESSMENTS - Focused Assessment []  - Circumferential Edema Measurements - multi extremities 0 []  - 0 Nutritional Assessment / Counseling / Intervention []  - 0 Lower Extremity Assessment (monofilament, tuning fork, pulses) []  - 0 Peripheral Arterial Disease Assessment (using hand held doppler) ASSESSMENTS - Ostomy and/or Continence Assessment and Care []  - Incontinence Assessment and Management 0 []  - 0 Ostomy Care Assessment and Management (repouching, etc.) PROCESS - Coordination of Care X - Simple Patient / Family Education for ongoing care 1 15 []  - 0 Complex (extensive) Patient / Family Education for ongoing care []  - 0 Staff obtains Programmer, systems, Records, Test Results / Process Orders []  - 0 Staff telephones HHA, Nursing Homes / Clarify orders / etc []  - 0 Routine Transfer to another Facility (non-emergent condition) []  - 0 Routine Hospital Admission (non-emergent condition) []  - 0 New Admissions / Biomedical engineer / Ordering NPWT, Apligraf, etc. []  - 0 Emergency Hospital Admission (emergent condition) X- 1 10 Simple Discharge Coordination Rosner, Corry C. ([id number]) []  - 0 Complex (extensive) Discharge Coordination PROCESS - Special Needs []  - Pediatric / Minor Patient Management 0 []  - 0 Isolation Patient Management []  - 0 Hearing / Language / Visual special needs []  - 0 Assessment of Community assistance (transportation, D/C planning, etc.) []  - 0 Additional assistance / Altered mentation []  - 0 Support Surface(s) Assessment (bed,  cushion, seat, etc.) INTERVENTIONS - Wound Cleansing / Measurement []  - Simple Wound Cleansing - one wound 0 X- 2 5 Complex Wound Cleansing - multiple wounds X- 1 5 Wound Imaging (photographs -  any number of wounds) []  - 0 Wound Tracing (instead of photographs) []  - 0 Simple Wound Measurement - one wound X- 2 5 Complex Wound Measurement - multiple wounds INTERVENTIONS - Wound Dressings []  - Small Wound Dressing one or multiple wounds 0 X- 2 15 Medium Wound Dressing one or multiple wounds []  - 0 Large Wound Dressing one or multiple wounds []  - 0 Application of Medications - topical []  - 0 Application of Medications - injection INTERVENTIONS - Miscellaneous []  - External ear exam 0 []  - 0 Specimen Collection (cultures, biopsies, blood, body fluids, etc.) []  - 0 Specimen(s) / Culture(s) sent or taken to Lab for analysis []  - 0 Patient Transfer (multiple staff / Civil Service fast streamer / Similar devices) []  - 0 Simple Staple / Suture removal (25 or less) []  - 0 Complex Staple / Suture removal (26 or more) []  - 0 Hypo / Hyperglycemic Management (close monitor of Blood Glucose) []  - 0 Ankle / Brachial Index (ABI) - do not check if billed separately X- 1 5 Vital Signs Gilles, Gennifer C. ([id number]) Has the patient been seen at the hospital within the last three years: Yes Total Score: 110 Level Of Care: New/Established - Level 3 Electronic Signature(s) Signed: 06/21/2018 2:45:46 PM By: Army Melia Entered By: Army Melia on 06/21/2018 14:40:17 Lemarr, [name] ([id number]) -------------------------------------------------------------------------------- Lower Extremity Assessment Details Patient Name: Slayton, Lamaria C. Date of Service: 06/21/2018 2:00 PM Medical Record Number: [id number] Patient Account Number: [id number] Date of Birth/Sex: 11-17-1927 (85 y.o. F) Treating RN: Montey Hora Primary Care Eero Dini: Ria Bush Other Clinician: Referring Shelie Lansing: Ria Bush Treating Ayva Veilleux/Extender: Melburn Hake, HOYT Weeks in Treatment: 12 Edema Assessment Assessed: [Left: No] [Right: No] [Left: Edema] [Right: :] Calf Left: Right: Point of Measurement: 30 cm From Medial Instep cm 27.5 cm Ankle Left: Right: Point of Measurement: 12 cm From Medial Instep cm 19 cm Vascular Assessment Pulses: Dorsalis Pedis Palpable: [Right:Yes] Posterior Tibial Extremity colors, hair growth, and conditions: Extremity Color: [Right:Hyperpigmented] Hair Growth on Extremity: [Right:No] Temperature of Extremity: [Right:Warm] Capillary Refill: [Right:< 3 seconds] Toe Nail Assessment Left: Right: Thick: Yes Discolored: Yes Deformed: No Improper Length and Hygiene: No Electronic Signature(s) Signed: 06/21/2018 4:49:35 PM By: Montey Hora Entered By: Montey Hora on 06/21/2018 14:28:21 Abruzzese, Ivyana CMarland Kitchen ([id number]) -------------------------------------------------------------------------------- Multi Wound Chart Details Patient Name: Wrigley, Tramaine C. Date of Service: 06/21/2018 2:00 PM Medical Record Number: [id number] Patient Account Number: [id number] Date of Birth/Sex: Aug 08, 1927 (85 y.o. F) Treating RN: Army Melia Primary Care Clancy Mullarkey: Ria Bush Other Clinician: Referring Sulamita Lafountain: Ria Bush Treating Anne Sebring/Extender: Melburn Hake, HOYT Weeks in Treatment: 12 Vital Signs Height(in): 58 Pulse(bpm): 51 Weight(lbs): 140 Blood Pressure(mmHg): 172/62 Body Mass Index(BMI): 29 Temperature(F): 99.9 Respiratory Rate 22 (breaths/min): Photos: [8:No Photos] [9:No Photos] [N/A:N/A] Wound Location: [8:Right Lower Leg - Lateral] [9:Right Foot - Dorsal] [N/A:N/A] Wounding Event: [8:Trauma] [9:Trauma] [N/A:N/A] Primary Etiology: [8:Diabetic Wound/Ulcer of the Lower Extremity] [9:Trauma, Other] [N/A:N/A] Comorbid History: [8:Cataracts, Anemia, Arrhythmia, Coronary Artery Disease, Hypertension, Myocardial Infarction, Type II Diabetes,  Osteoarthritis] [9:Cataracts, Anemia, Arrhythmia, Coronary Artery Disease, Hypertension, Myocardial Infarction, Type II  Diabetes, Osteoarthritis] [N/A:N/A] Date Acquired: [8:03/09/2018] [9:06/10/2018] [N/A:N/A] Weeks of Treatment: [8:12] [9:1] [N/A:N/A] Wound Status: [8:Open] [9:Open] [N/A:N/A] Measurements L x W x D [8:3.6x1.5x0.2] [9:0.3x0.5x0.1] [N/A:N/A] (cm) Area (cm) : [8:4.241] [9:0.118] [N/A:N/A] Volume (cm) : [8:0.848] [9:0.012] [N/A:N/A] % Reduction in Area: [8:-125.00%] [9:16.30%] [N/A:N/A] % Reduction in Volume: [8:-50.10%] [9:14.30%] [N/A:N/A] Classification: [8:Grade 2] [9:Partial Thickness] [N/A:N/A] Exudate Amount: [8:Medium] [9:None Present] [N/A:N/A] Exudate Type: [8:Purulent] [9:N/A] [  N/A:N/A] Exudate Color: [8:yellow, brown, green] [9:N/A] [N/A:N/A] Wound Margin: [8:Flat and Intact] [9:Flat and Intact] [N/A:N/A] Granulation Amount: [8:None Present (0%)] [9:None Present (0%)] [N/A:N/A] Necrotic Amount: [8:Large (67-100%)] [9:Large (67-100%)] [N/A:N/A] Necrotic Tissue: [8:Adherent Slough] [9:Eschar] [N/A:N/A] Exposed Structures: [8:Fat Layer (Subcutaneous Tissue) Exposed: Yes Fascia: No Tendon: No Muscle: No Joint: No Bone: No] [9:Fascia: No Fat Layer (Subcutaneous Tissue) Exposed: No Tendon: No Muscle: No Joint: No Bone: No Limited to Skin Breakdown] [N/A:N/A] Epithelialization: [8:None] [9:None] [N/A:N/A] Periwound Skin Texture: [N/A:N/A] Excoriation: Yes Excoriation: No Induration: No Induration: No Callus: No Callus: No Crepitus: No Crepitus: No Rash: No Rash: No Scarring: No Scarring: No Periwound Skin Moisture: Dry/Scaly: Yes Maceration: No N/A Maceration: No Dry/Scaly: No Periwound Skin Color: Erythema: Yes Atrophie Blanche: No N/A Atrophie Blanche: No Cyanosis: No Cyanosis: No Ecchymosis: No Ecchymosis: No Erythema: No Hemosiderin Staining: No Hemosiderin Staining: No Mottled: No Mottled: No Pallor: No Pallor: No Rubor: No Rubor:  No Erythema Location: Circumferential N/A N/A Temperature: No Abnormality No Abnormality N/A Tenderness on Palpation: Yes Yes N/A Wound Preparation: Ulcer Cleansing: Ulcer Cleansing: N/A Rinsed/Irrigated with Saline Rinsed/Irrigated with Saline Topical Anesthetic Applied: Topical Anesthetic Applied: Other: lidocaine 4% None Treatment Notes Electronic Signature(s) Signed: 06/21/2018 2:45:46 PM By: Army Melia Entered By: Army Melia on 06/21/2018 14:36:03 Amendola, [name] ([id number]) -------------------------------------------------------------------------------- Maries Details Patient Name: Jurczyk, Shanitra C. Date of Service: 06/21/2018 2:00 PM Medical Record Number: [id number] Patient Account Number: [id number] Date of Birth/Sex: 1927-12-21 (85 y.o. F) Treating RN: Army Melia Primary Care Easter Kennebrew: Ria Bush Other Clinician: Referring Siria Calandro: Ria Bush Treating Latrel Szymczak/Extender: Melburn Hake, HOYT Weeks in Treatment: 12 Active Inactive Abuse / Safety / Falls / Self Care Management Nursing Diagnoses: Potential for falls Goals: Patient will not experience any injury related to falls Date Initiated: 03/23/2018 Target Resolution Date: 06/09/2018 Goal Status: Active Interventions: Assess fall risk on admission and as needed Notes: Orientation to the Wound Care Program Nursing Diagnoses: Knowledge deficit related to the wound healing center program Goals: Patient/caregiver will verbalize understanding of the [place] [facility] [place] Program Date Initiated: 03/23/2018 Target Resolution Date: 06/09/2018 Goal Status: Active Interventions: Provide education on orientation to the wound center Notes: Pain, Acute or Chronic Nursing Diagnoses: Pain, acute or chronic: actual or potential Goals: Patient will verbalize adequate pain control and receive pain control interventions during procedures as needed Date Initiated: 03/23/2018 Target  Resolution Date: 06/09/2018 Goal Status: Active Interventions: Assess comfort goal upon admission Vivona, Maverick C. ([id number]) Notes: Wound/Skin Impairment Nursing Diagnoses: Impaired tissue integrity Goals: Ulcer/skin breakdown will heal within 14 weeks Date Initiated: 03/23/2018 Target Resolution Date: 06/09/2018 Goal Status: Active Interventions: Assess patient/caregiver ability to obtain necessary supplies Assess patient/caregiver ability to perform ulcer/skin care regimen upon admission and as needed Assess ulceration(s) every visit Notes: Electronic Signature(s) Signed: 06/21/2018 2:45:46 PM By: Army Melia Entered By: Army Melia on 06/21/2018 14:35:51 Kondracki, Ronnetta C. ([id number]) -------------------------------------------------------------------------------- Pain Assessment Details Patient Name: Talford, Sherene C. Date of Service: 06/21/2018 2:00 PM Medical Record Number: [id number] Patient Account Number: [id number] Date of Birth/Sex: 22-Feb-1928 (85 y.o. F) Treating RN: Montey Hora Primary Care Malavika Lira: Ria Bush Other Clinician: Referring Carvel Huskins: Ria Bush Treating Ehtan Delfavero/Extender: Melburn Hake, HOYT Weeks in Treatment: 12 Active Problems Location of Pain Severity and Description of Pain Patient Has [name] Yes Site Locations Pain Location: Pain in Ulcers With Dressing Change: Yes Duration of the Pain. Constant / Intermittento Constant Pain Management and Medication Current Pain Management: Electronic Signature(s) Signed: 06/21/2018 4:49:35 PM By: Montey Hora Entered  By: Montey Hora on 06/21/2018 14:18:10 Belt, [name] ([id number]) -------------------------------------------------------------------------------- Patient/Caregiver Education Details Patient Name: Barella, Genni C. Date of Service: 06/21/2018 2:00 PM Medical Record Number: [id number] Patient Account Number: [id number] Date of Birth/Gender: [birth year] (85 y.o.  F) Treating RN: Army Melia Primary Care Physician: Ria Bush Other Clinician: Referring Physician: Ria Bush Treating Physician/Extender: Sharalyn Ink in Treatment: 12 Education Assessment Education Provided To: Patient Education Topics Provided Wound/Skin Impairment: Handouts: Caring for Your Ulcer Methods: Demonstration, Explain/Verbal Responses: State content correctly Electronic Signature(s) Signed: 06/21/2018 2:45:46 PM By: Army Melia Entered By: Army Melia on 06/21/2018 14:40:37 Dieudonne, [name] ([id number]) -------------------------------------------------------------------------------- Wound Assessment Details Patient Name: Attia, Angelmarie C. Date of Service: 06/21/2018 2:00 PM Medical Record Number: [id number] Patient Account Number: [id number] Date of Birth/Sex: 03/07/1928 (85 y.o. F) Treating RN: Montey Hora Primary Care Chaunte Hornbeck: Ria Bush Other Clinician: Referring Glender Augusta: Ria Bush Treating Hosteen Kienast/Extender: STONE III, HOYT Weeks in Treatment: 12 Wound Status Wound Number: 8 Primary Diabetic Wound/Ulcer of the Lower Extremity Etiology: Wound Location: Right Lower Leg - Lateral Wound Open Wounding Event: Trauma Status: Date Acquired: 03/09/2018 Comorbid Cataracts, Anemia, Arrhythmia, Coronary Artery Weeks Of Treatment: 12 History: Disease, Hypertension, Myocardial Infarction, Clustered Wound: No Type II Diabetes, Osteoarthritis Photos Photo Uploaded By: Montey Hora on 06/21/2018 15:31:06 Wound Measurements Length: (cm) 3.6 Width: (cm) 1.5 Depth: (cm) 0.2 Area: (cm) 4.241 Volume: (cm) 0.848 % Reduction in Area: -125% % Reduction in Volume: -50.1% Epithelialization: None Tunneling: No Undermining: No Wound Description Classification: Grade 2 Wound Margin: Flat and Intact Exudate Amount: Medium Exudate Type: Purulent Exudate Color: yellow, brown, green Foul Odor After Cleansing:  No Slough/Fibrino Yes Wound Bed Granulation Amount: None Present (0%) Exposed Structure Necrotic Amount: Large (67-100%) Fascia Exposed: No Necrotic Quality: Adherent Slough Fat Layer (Subcutaneous Tissue) Exposed: Yes Tendon Exposed: No Muscle Exposed: No Joint Exposed: No Bone Exposed: No Periwound Skin Texture Leppert, Laqueshia C. ([id number]) Texture Color No Abnormalities Noted: No No Abnormalities Noted: No Callus: No Atrophie Blanche: No Crepitus: No Cyanosis: No Excoriation: Yes Ecchymosis: No Induration: No Erythema: Yes Rash: No Erythema Location: Circumferential Scarring: No Hemosiderin Staining: No Mottled: No Moisture Pallor: No No Abnormalities Noted: No Rubor: No Dry / Scaly: Yes Maceration: No Temperature / Pain Temperature: No Abnormality Tenderness on Palpation: Yes Wound Preparation Ulcer Cleansing: Rinsed/Irrigated with Saline Topical Anesthetic Applied: Other: lidocaine 4%, Electronic Signature(s) Signed: 06/21/2018 4:49:35 PM By: Montey Hora Entered By: Montey Hora on 06/21/2018 14:27:17 Buesing, [name] ([id number]) -------------------------------------------------------------------------------- Wound Assessment Details Patient Name: Carfagno, Shavette C. Date of Service: 06/21/2018 2:00 PM Medical Record Number: [id number] Patient Account Number: [id number] Date of Birth/Sex: 07-19-1927 (85 y.o. F) Treating RN: Montey Hora Primary Care Paradise Vensel: Ria Bush Other Clinician: Referring Autumn Pruitt: Ria Bush Treating Bettyjo Lundblad/Extender: STONE III, HOYT Weeks in Treatment: 12 Wound Status Wound Number: 9 Primary Trauma, Other Etiology: Wound Location: Right Foot - Dorsal Wound Open Wounding Event: Trauma Status: Date Acquired: 06/10/2018 Comorbid Cataracts, Anemia, Arrhythmia, Coronary Artery Weeks Of Treatment: 1 History: Disease, Hypertension, Myocardial Infarction, Clustered Wound: No Type II Diabetes,  Osteoarthritis Photos Photo Uploaded By: Montey Hora on 06/21/2018 15:31:30 Wound Measurements Length: (cm) 0.3 Width: (cm) 0.5 Depth: (cm) 0.1 Area: (cm) 0.118 Volume: (cm) 0.012 % Reduction in Area: 16.3% % Reduction in Volume: 14.3% Epithelialization: None Tunneling: No Undermining: No Wound Description Classification: Partial Thickness Foul O Wound Margin: Flat and Intact Slough Exudate Amount: None Present dor After Cleansing: No /Fibrino No Wound Bed Granulation Amount: None Present (0%) Exposed  Structure Necrotic Amount: Large (67-100%) Fascia Exposed: No Necrotic Quality: Eschar Fat Layer (Subcutaneous Tissue) Exposed: No Tendon Exposed: No Muscle Exposed: No Joint Exposed: No Bone Exposed: No Limited to Skin Breakdown Periwound Skin Texture Texture Color Schumpert, Stephana C. ([id number]) No Abnormalities Noted: No No Abnormalities Noted: No Callus: No Atrophie Blanche: No Crepitus: No Cyanosis: No Excoriation: No Ecchymosis: No Induration: No Erythema: No Rash: No Hemosiderin Staining: No Scarring: No Mottled: No Pallor: No Moisture Rubor: No No Abnormalities Noted: No Dry / Scaly: No Temperature / Pain Maceration: No Temperature: No Abnormality Tenderness on Palpation: Yes Wound Preparation Ulcer Cleansing: Rinsed/Irrigated with Saline Topical Anesthetic Applied: None Electronic Signature(s) Signed: 06/21/2018 4:49:35 PM By: Montey Hora Entered By: Montey Hora on 06/21/2018 14:26:40 Zaragoza, [name] ([id number]) -------------------------------------------------------------------------------- Vitals Details Patient Name: Kingdon, Donisha C. Date of Service: 06/21/2018 2:00 PM Medical Record Number: [id number] Patient Account Number: [id number] Date of Birth/Sex: 1927/07/07 (85 y.o. F) Treating RN: Montey Hora Primary Care Copeland Lapier: Ria Bush Other Clinician: Referring Kerrion Kemppainen: Ria Bush Treating  Gifford Ballon/Extender: STONE III, HOYT Weeks in Treatment: 12 Vital Signs Time Taken: 14:28 Temperature (F): 99.9 Height (in): 58 Pulse (bpm): 89 Weight (lbs): 140 Respiratory Rate (breaths/min): 22 Body Mass Index (BMI): 29.3 Blood Pressure (mmHg): 172/62 Reference Range: 80 - 120 mg / dl Notes patient with c/o being very cold, having lower back pain and decreased urine output. hoyt notified Electronic Signature(s) Signed: 06/21/2018 4:49:35 PM By: Montey Hora Entered By: Montey Hora on 06/21/2018 14:29:52

## 2018-06-23 NOTE — Progress Notes (Signed)
[name] ([id number]) Visit Report for 06/21/2018 Chief Complaint Document Details Patient Name: [name], [name] Date of Service: 06/21/2018 2:00 PM Medical Record Number: [id number] Patient Account Number: [id number] Date of Birth/Sex: Feb 26, 1928 (85 y.o. F) Treating RN: [name] Primary Care Provider: Ria [name] Other Clinician: Referring Provider: Ria [name] Treating Provider/Extender: [name] Weeks in Treatment: 12 Information Obtained from: Patient Chief Complaint RLE wounds Electronic Signature(s) Signed: 06/22/2018 11:59:00 AM By: [name] Keeler PA-C Entered By: [name] on 06/21/2018 14:29:24 [name] ([id number]) -------------------------------------------------------------------------------- HPI Details Patient Name: [name], [name] Date of Service: 06/21/2018 2:00 PM Medical Record Number: [id number] Patient Account Number: [id number] Date of Birth/Sex: 03-25-1928 (85 y.o. F) Treating RN: [name] Primary Care Provider: Ria [name] Other Clinician: Referring Provider: Ria [name] Treating Provider/Extender: [name] Weeks in Treatment: 12 History of Present Illness HPI Description: Readmission: 03/23/18 on evaluation today patient presents for readmission entire clinic concerning issues that she has been having for the past couple of weeks in regard to her right lower extremity. The medial injury actually occurred when her dog tried to take a blanket from her and subsequently scratched her with his toenail. Subsequently the lateral injury was a wound where she dropped a knife in her kitchen and hit the side of her leg. With that being said both have been open for some time and home health nurse that has been coming out marked for her where the redness was several days ago. Nonetheless this has spread will be on the margin of what was marked. She was placed on Keflex which was started two days ago  but at this point it does not seem to have helped with any improvement in the overall appearance of the wounds. No fevers, chills, nausea, or vomiting noted at this time. 04/02/18 Seen today for follow up and management of right lower extremity wounds. Right lateral wound with adherent slough. She is complaining of pain to touch which limited exam. Recently treated in the ED for cellulitis and wound culture positive for MRSA. Completed a dose of Vancomycin and transitioned to doxycycline by mouth. Erythema remains present with improvement since last visit. Right medial wound with scab present. No fevers, chills, nausea, or vomiting noted at this time. 04/10/18 on evaluation today patient appears to be doing a [name] better in regard to her right lateral lower [name]. The slough does seem to be coming off although it is slow from the wound bed. Fortunately there does not appear to be any evidence of infection at this time which is good news. With that being said I do believe that she is going to require longer treatment with the Santyl simply due to the fact that I'm not able to really debride the wound due to the pain that she is experiencing. 04/16/18 upon evaluation today patient's wound bed currently on the lateral lower extremity on the right appears to be doing a [name] better at this point. We are utilizing sensible currently. Overall I'm very pleased with how things are progressing although there still is some work to do before this will be completely healed. No fevers, chills, nausea, or vomiting noted at this time. 04/23/18 on evaluation today patient appears to be doing about the same at this point maybe just a [name] bit smaller than last week's evaluation. She continues to have some slough buildup on the surface of the wound and has very [name] granulation unfortunately. This seems to be very slow moving  I'm getting somewhat concerned about the possibility of her having decreased  blood flow which is leading to her subsequently not being able to heal this area as quickly as she shut otherwise. She is currently utilizing Santyl. 05/01/18 on evaluation today patient appears to be doing somewhat poorly in regard to her lower extremity ulcer. At this point she's been tolerating the dressing changes to some degree without complication. With that being said she does note of the past several days that she has been having more pain unfortunately. On inspection it appears that she may have signs of infection which would account for the pain. 05/07/18 on evaluation today patient's wound actually appears to be doing better she's not having as much erythema in the right lower extremity as compared to last week. Fortunately there's no signs of infection this is good news as well. Overall very pleased with the progress that she has made up to this point. 05/14/18 on evaluation today patient actually appears to be doing rather well in regard to her right lower extremity ulcer. She does not have erythema that was characterizing the cellulitis prior to initiation of antibiotic therapy. With that being said her wound still appears to be somewhat slow in healing I believe this may be arterial in nature that is my concern at least. With that being said I do want her to see the vascular specialist as soon as possible we been trying to get this set up I'm not sure if she's not answering the phone or what exactly is going on here. [name] ([id number]) 05/21/18 on evaluation today patient's wound actually appears to be doing a Danser better possibly some additional granulation compared to my last evaluation. With that being said she still continues to have a lot of pain especially when her legs are elevated such as sitting in the chair here in the office. There does not appear to be any signs of infection which is good news. 05/28/18 on evaluation today patient appears to be doing a [name]  better in regard to her right lateral lower [name]. Again she was supposed to have an appointment with vascular unfortunately they attempted to call her three times and unfortunately were unable to get anything scheduled. That reason closed up for him. She tells me that if the phone is answered and she doesn't recognize what is that she will hang up as well those that she lives with. Obviously she's not gonna recognize somebody calling from the office that she has not previously spoken with. Nonetheless the patient states that she does not remember any fall. I think if we're gonna get her in with vascular specialist we're gonna have to try to make the appointment for her. 06/04/2018 Seen today for follow up and management of right lateral lower extremity wound. Adherent slough present over surface of wound. Scheduled to follow up for ABI/TBI on 06/06/2018 with vein and vascular specialist. She is hard of hearing. Wound is sensitive to touch. No signs or symptoms of infection. Denies any recent fevers, chills, or SOB. 06/11/18 on evaluation today patient actually appears to be doing very well in regard to her right lateral lower extremity ulcer. She's been tolerating the dressing changes without complication. Fortunately there's no evidence of infection. With that being said she did have her arterial studies which revealed that she had a very low ABI which was 0.67 on the right with a TBI of 0.32 and 0.65 on the left with a TBI of 0.16. I  do believe she needs to see vascular and we are working on that referral as well. 06/21/18 on evaluation today patient's wound actually appears to be doing okay she does have an appointment with vascular coming up on the 24th. Nonetheless she's been having some issues that are more urinary in nature right now that are causing trouble for her. Fortunately there's no signs of severe sepsis although she is running a low-grade temperature 99.9 on evaluation today. She  also is having some issues right now with chills as well as urinary urgency and frequency. Again I feel like the city is more urinary and symptomology I do not believe she has anything upper respiratory which is good news. Nonetheless I think this may need to be checked out more quickly especially in light of the fact that she's having significant back pain at this point. Electronic Signature(s) Signed: 06/22/2018 11:59:00 AM By: [name] Keeler PA-C Entered By: [name] on 06/22/2018 11:44:42 Roarty, [name] ([id number]) -------------------------------------------------------------------------------- Physical Exam Details Patient Name: [name], [name] Date of Service: 06/21/2018 2:00 PM Medical Record Number: [id number] Patient Account Number: [id number] Date of Birth/Sex: [birth year] (85 y.o. F) Treating RN: [name] Primary Care Provider: Ria [name] Other Clinician: Referring Provider: Ria [name] Treating Provider/Extender: STONE III, [name] Weeks in Treatment: 64 Constitutional Well-nourished and well-hydrated in no acute distress. Respiratory normal breathing without difficulty. clear to auscultation bilaterally. Cardiovascular regular rate and rhythm with normal S1, S2. Psychiatric this patient is able to make decisions and demonstrates good insight into disease process. Alert and Oriented x 3. pleasant and cooperative. Notes Patient's wound bed currently shows evidence of some Slough noted on the surface of the wound and it is very touchy on inspection and evaluation today. With that being said there's no signs of infection I do not believe this has anything to do with the way she is feeling right now. Electronic Signature(s) Signed: 06/22/2018 11:59:00 AM By: [name] Keeler PA-C Entered By: [name] on 06/22/2018 11:45:04 Geidel, [name] ([id number]) -------------------------------------------------------------------------------- Physician  Orders Details Patient Name: [name], [name] Date of Service: 06/21/2018 2:00 PM Medical Record Number: [id number] Patient Account Number: [id number] Date of Birth/Sex: Jan 31, 1928 (85 y.o. F) Treating RN: [name] Primary Care Provider: Ria [name] Other Clinician: Referring Provider: Ria [name] Treating Provider/Extender: [name] Weeks in Treatment: 12 Verbal / Phone Orders: No Diagnosis Coding ICD-10 Coding Code Description I87.331 Chronic venous hypertension (idiopathic) with ulcer and inflammation of right lower extremity E11.622 Type 2 diabetes mellitus with other skin ulcer L97.812 Non-pressure chronic ulcer of other part of right lower leg with fat layer exposed L03.115 Cellulitis of right lower limb Wound Cleansing Wound #8 Right,Lateral Lower Leg o Clean wound with Normal Saline. Wound #9 Right,Dorsal Foot o Clean wound with Normal Saline. Anesthetic (add to Medication List) Wound #8 Right,Lateral Lower Leg o Topical Lidocaine 4% cream applied to wound bed prior to debridement (In Clinic Only). Wound #9 Right,Dorsal Foot o Topical Lidocaine 4% cream applied to wound bed prior to debridement (In Clinic Only). Primary Wound Dressing Wound #8 Right,Lateral Lower Leg o Silver Alginate Wound #9 Right,Dorsal Foot o Silver Alginate Secondary Dressing Wound #8 Right,Lateral Lower Leg o ABD and Kerlix/Conform - wrap with conform Wound #9 Right,Dorsal Foot o ABD and Kerlix/Conform - wrap with conform Dressing Change Frequency Wound #8 Right,Lateral Lower Leg o Change dressing every week Wound #9 Right,Dorsal Foot o Change dressing every week [name], [name] ([id number]) Follow-up Appointments Wound #8  Right,Lateral Lower Leg o Return Appointment in 1 week. Wound #9 Right,Dorsal Foot o Return Appointment in 1 week. Edema Control Wound #8 Right,Lateral Lower Leg o Elevate legs to the level of the heart and pump ankles  as often as possible o Other: - tubigrip Wound #9 Right,Dorsal Foot o Elevate legs to the level of the heart and pump ankles as often as possible o Other: - tubigrip Electronic Signature(s) Signed: 06/21/2018 2:45:46 PM By: [name] Signed: 06/22/2018 11:59:00 AM By: [name] Keeler PA-C Entered By: [name] on 06/21/2018 14:39:22 Bess, [name] ([id number]) -------------------------------------------------------------------------------- Problem List Details Patient Name: [name], [name] Date of Service: 06/21/2018 2:00 PM Medical Record Number: [id number] Patient Account Number: [id number] Date of Birth/Sex: 11-May-1927 (85 y.o. F) Treating RN: [name] Primary Care Provider: Ria [name] Other Clinician: Referring Provider: Ria [name] Treating Provider/Extender: [name] Weeks in Treatment: 12 Active Problems ICD-10 Evaluated Encounter Code Description Active Date Today Diagnosis I87.331 Chronic venous hypertension (idiopathic) with ulcer and 03/23/2018 No Yes inflammation of right lower extremity E11.622 Type 2 diabetes mellitus with other skin ulcer 03/23/2018 No Yes L97.812 Non-pressure chronic ulcer of other part of right lower leg 03/23/2018 No Yes with fat layer exposed L03.115 Cellulitis of right lower limb 03/23/2018 No Yes Inactive Problems Resolved Problems Electronic Signature(s) Signed: 06/22/2018 11:59:00 AM By: [name] Keeler PA-C Entered By: [name] on 06/21/2018 14:29:19 Latella, [name] ([id number]) -------------------------------------------------------------------------------- Progress Note Details Patient Name: [name], [name] Date of Service: 06/21/2018 2:00 PM Medical Record Number: [id number] Patient Account Number: [id number] Date of Birth/Sex: March 29, 1928 (85 y.o. F) Treating RN: [name] Primary Care Provider: Ria [name] Other Clinician: Referring Provider: Ria [name] Treating  Provider/Extender: [name] Weeks in Treatment: 12 Subjective Chief Complaint Information obtained from Patient RLE wounds History of Present Illness (HPI) Readmission: 03/23/18 on evaluation today patient presents for readmission entire clinic concerning issues that she has been having for the past couple of weeks in regard to her right lower extremity. The medial injury actually occurred when her dog tried to take a blanket from her and subsequently scratched her with his toenail. Subsequently the lateral injury was a wound where she dropped a knife in her kitchen and hit the side of her leg. With that being said both have been open for some time and home health nurse that has been coming out marked for her where the redness was several days ago. Nonetheless this has spread will be on the margin of what was marked. She was placed on Keflex which was started two days ago but at this point it does not seem to have helped with any improvement in the overall appearance of the wounds. No fevers, chills, nausea, or vomiting noted at this time. 04/02/18 Seen today for follow up and management of right lower extremity wounds. Right lateral wound with adherent slough. She is complaining of pain to touch which limited exam. Recently treated in the ED for cellulitis and wound culture positive for MRSA. Completed a dose of Vancomycin and transitioned to doxycycline by mouth. Erythema remains present with improvement since last visit. Right medial wound with scab present. No fevers, chills, nausea, or vomiting noted at this time. 04/10/18 on evaluation today patient appears to be doing a Lok better in regard to her right lateral lower [name]. The slough does seem to be coming off although it is slow from the wound bed. Fortunately there does not appear to be any evidence of  infection at this time which is good news. With that being said I do believe that she is going to require  longer treatment with the Santyl simply due to the fact that I'm not able to really debride the wound due to the pain that she is experiencing. 04/16/18 upon evaluation today patient's wound bed currently on the lateral lower extremity on the right appears to be doing a Worrel better at this point. We are utilizing sensible currently. Overall I'm very pleased with how things are progressing although there still is some work to do before this will be completely healed. No fevers, chills, nausea, or vomiting noted at this time. 04/23/18 on evaluation today patient appears to be doing about the same at this point maybe just a Vasudevan bit smaller than last week's evaluation. She continues to have some slough buildup on the surface of the wound and has very Schooler granulation unfortunately. This seems to be very slow moving I'm getting somewhat concerned about the possibility of her having decreased blood flow which is leading to her subsequently not being able to heal this area as quickly as she shut otherwise. She is currently utilizing Santyl. 05/01/18 on evaluation today patient appears to be doing somewhat poorly in regard to her lower extremity ulcer. At this point she's been tolerating the dressing changes to some degree without complication. With that being said she does note of the past several days that she has been having more pain unfortunately. On inspection it appears that she may have signs of infection which would account for the pain. 05/07/18 on evaluation today patient's wound actually appears to be doing better she's not having as much erythema in the right lower extremity as compared to last week. Fortunately there's no signs of infection this is good news as well. Overall very pleased with the progress that she has made up to this point. BLESS, LISENBY ([id number]) 05/14/18 on evaluation today patient actually appears to be doing rather well in regard to her right lower extremity  ulcer. She does not have erythema that was characterizing the cellulitis prior to initiation of antibiotic therapy. With that being said her wound still appears to be somewhat slow in healing I believe this may be arterial in nature that is my concern at least. With that being said I do want her to see the vascular specialist as soon as possible we been trying to get this set up I'm not sure if she's not answering the phone or what exactly is going on here. 05/21/18 on evaluation today patient's wound actually appears to be doing a Groner better possibly some additional granulation compared to my last evaluation. With that being said she still continues to have a lot of pain especially when her legs are elevated such as sitting in the chair here in the office. There does not appear to be any signs of infection which is good news. 05/28/18 on evaluation today patient appears to be doing a Sponsel better in regard to her right lateral lower [name]. Again she was supposed to have an appointment with vascular unfortunately they attempted to call her three times and unfortunately were unable to get anything scheduled. That reason closed up for him. She tells me that if the phone is answered and she doesn't recognize what is that she will hang up as well those that she lives with. Obviously she's not gonna recognize somebody calling from the office that she has not previously spoken with. Nonetheless the patient  states that she does not remember any fall. I think if we're gonna get her in with vascular specialist we're gonna have to try to make the appointment for her. 06/04/2018 Seen today for follow up and management of right lateral lower extremity wound. Adherent slough present over surface of wound. Scheduled to follow up for ABI/TBI on 06/06/2018 with vein and vascular specialist. She is hard of hearing. Wound is sensitive to touch. No signs or symptoms of infection. Denies any recent fevers,  chills, or SOB. 06/11/18 on evaluation today patient actually appears to be doing very well in regard to her right lateral lower extremity ulcer. She's been tolerating the dressing changes without complication. Fortunately there's no evidence of infection. With that being said she did have her arterial studies which revealed that she had a very low ABI which was 0.67 on the right with a TBI of 0.32 and 0.65 on the left with a TBI of 0.16. I do believe she needs to see vascular and we are working on that referral as well. 06/21/18 on evaluation today patient's wound actually appears to be doing okay she does have an appointment with vascular coming up on the 24th. Nonetheless she's been having some issues that are more urinary in nature right now that are causing trouble for her. Fortunately there's no signs of severe sepsis although she is running a low-grade temperature 99.9 on evaluation today. She also is having some issues right now with chills as well as urinary urgency and frequency. Again I feel like the city is more urinary and symptomology I do not believe she has anything upper respiratory which is good news. Nonetheless I think this may need to be checked out more quickly especially in light of the fact that she's having significant back pain at this point. Patient History Information obtained from Patient. Family History Diabetes - Father,Siblings,Child, Hypertension - Child, Lung Disease - Child, Stroke - Mother, No family history of Cancer, Heart Disease, Kidney Disease, Seizures, Thyroid Problems. Social History Former smoker, Marital Status - Single, Alcohol Use - Never, Drug Use - No History, Caffeine Use - Daily. Medical History Eyes Patient has history of Cataracts - removed Denies history of Glaucoma, Optic Neuritis Hematologic/Lymphatic Patient has history of Anemia Denies history of Hemophilia, Human Immunodeficiency Virus, Lymphedema, Sickle Cell  Disease Respiratory Denies history of Aspiration, Asthma, Chronic Obstructive Pulmonary Disease (COPD), Pneumothorax, Sleep Apnea, Tuberculosis Cardiovascular Patient has history of Arrhythmia, Coronary Artery Disease, Hypertension, Myocardial Infarction Parrack, Sonna C. ([id number]) Denies history of Angina, Congestive Heart Failure, Deep Vein Thrombosis, Hypotension, Peripheral Arterial Disease, Peripheral Venous Disease, Phlebitis Endocrine Patient has history of Type II Diabetes Denies history of Type I Diabetes Genitourinary Denies history of End Stage Renal Disease Immunological Denies history of Lupus Erythematosus, Raynaud s, Scleroderma Integumentary (Skin) Denies history of History of Burn, History of pressure wounds Musculoskeletal Patient has history of Osteoarthritis Denies history of Gout, Rheumatoid Arthritis, Osteomyelitis Neurologic Denies history of Dementia, Neuropathy, Quadriplegia, Paraplegia, Seizure Disorder Oncologic Denies history of Received Chemotherapy, Received Radiation Medical And Surgical History Notes Constitutional Symptoms (General Health) High Blood Pressure, open heart surgery, gall stone, hysterectomy; Fluid pill, Anxiety Review of Systems (ROS) Constitutional Symptoms (General Health) Denies complaints or symptoms of Fever, Chills. Respiratory The patient has no complaints or symptoms. Cardiovascular The patient has no complaints or symptoms. Psychiatric The patient has no complaints or symptoms. Objective Constitutional Well-nourished and well-hydrated in no acute distress. Vitals Time Taken: 2:28 PM, Height: 58 in, Weight: 140  lbs, BMI: 29.3, Temperature: 99.9 F, Pulse: 89 bpm, Respiratory Rate: 22 breaths/min, Blood Pressure: 172/62 mmHg. General Notes: patient with c/o being very cold, having lower back pain and decreased urine output. [name] notified Respiratory normal breathing without difficulty. clear to auscultation  bilaterally. Cardiovascular regular rate and rhythm with normal S1, S2. Psychiatric this patient is able to make decisions and demonstrates good insight into disease process. Alert and Oriented x 3. pleasant [name], [name] ([id number]) and cooperative. General Notes: Patient's wound bed currently shows evidence of some Slough noted on the surface of the wound and it is very touchy on inspection and evaluation today. With that being said there's no signs of infection I do not believe this has anything to do with the way she is feeling right now. Integumentary (Hair, Skin) Wound #8 status is Open. Original cause of wound was Trauma. The wound is located on the Right,Lateral Lower Leg. The wound measures 3.6cm length x 1.5cm width x 0.2cm depth; 4.241cm^2 area and 0.848cm^3 volume. There is Fat Layer (Subcutaneous Tissue) Exposed exposed. There is no tunneling or undermining noted. There is a medium amount of purulent drainage noted. The wound margin is flat and intact. There is no granulation within the wound bed. There is a large (67-100%) amount of necrotic tissue within the wound bed including Adherent Slough. The periwound skin appearance exhibited: Excoriation, Dry/Scaly, Erythema. The periwound skin appearance did not exhibit: Callus, Crepitus, Induration, Rash, Scarring, Maceration, Atrophie Blanche, Cyanosis, Ecchymosis, Hemosiderin Staining, Mottled, Pallor, Rubor. The surrounding wound skin color is noted with erythema which is circumferential. Periwound temperature was noted as No Abnormality. The periwound has tenderness on palpation. Wound #9 status is Open. Original cause of wound was Trauma. The wound is located on the Right,Dorsal Foot. The wound measures 0.3cm length x 0.5cm width x 0.1cm depth; 0.118cm^2 area and 0.012cm^3 volume. The wound is limited to skin breakdown. There is no tunneling or undermining noted. There is a none present amount of drainage noted. The  wound margin is flat and intact. There is no granulation within the wound bed. There is a large (67-100%) amount of necrotic tissue within the wound bed including Eschar. The periwound skin appearance did not exhibit: Callus, Crepitus, Excoriation, Induration, Rash, Scarring, Dry/Scaly, Maceration, Atrophie Blanche, Cyanosis, Ecchymosis, Hemosiderin Staining, Mottled, Pallor, Rubor, Erythema. Periwound temperature was noted as No Abnormality. The periwound has tenderness on palpation. Assessment Active Problems ICD-10 Chronic venous hypertension (idiopathic) with ulcer and inflammation of right lower extremity Type 2 diabetes mellitus with other skin ulcer Non-pressure chronic ulcer of other part of right lower leg with fat layer exposed Cellulitis of right lower limb Plan Wound Cleansing: Wound #8 Right,Lateral Lower Leg: Clean wound with Normal Saline. Wound #9 Right,Dorsal Foot: Clean wound with Normal Saline. Anesthetic (add to Medication List): Wound #8 Right,Lateral Lower Leg: Topical Lidocaine 4% cream applied to wound bed prior to debridement (In Clinic Only). Wound #9 Right,Dorsal Foot: Topical Lidocaine 4% cream applied to wound bed prior to debridement (In Clinic Only). Primary Wound Dressing: Wound #8 Right,Lateral Lower Leg: Warshaw, Sae C. ([id number]) Silver Alginate Wound #9 Right,Dorsal Foot: Silver Alginate Secondary Dressing: Wound #8 Right,Lateral Lower Leg: ABD and Kerlix/Conform - wrap with conform Wound #9 Right,Dorsal Foot: ABD and Kerlix/Conform - wrap with conform Dressing Change Frequency: Wound #8 Right,Lateral Lower Leg: Change dressing every week Wound #9 Right,Dorsal Foot: Change dressing every week Follow-up Appointments: Wound #8 Right,Lateral Lower Leg: Return Appointment in 1 week. Wound #9 Right,Dorsal Foot: Return  Appointment in 1 week. Edema Control: Wound #8 Right,Lateral Lower Leg: Elevate legs to the level of the heart and pump  ankles as often as possible Other: - tubigrip Wound #9 Right,Dorsal Foot: Elevate legs to the level of the heart and pump ankles as often as possible Other: - tubigrip My suggestion at this point is gonna be that we go ahead and initiate the above wound care measures for the next week. With that being said in light of the fact that she's having the low-grade temperature along with chills and overall is generally not feeling well I recommended that she go to the ER for further evaluation and treatment. We will subsequently see what they have to say my concern is the possibility of early onset pyelonephritis. Nonetheless this may not be the case but at minimum I think she may have a more significant urinary tract infection. We will see what they have to say. Please see above for specific wound care orders. We will see patient for re-evaluation in 1 week(s) here in the clinic. If anything worsens or changes patient will contact our office for additional recommendations. Electronic Signature(s) Signed: 06/22/2018 11:59:00 AM By: [name] Keeler PA-C Entered By: [name] on 06/22/2018 11:45:22 Duerson, [name] ([id number]) -------------------------------------------------------------------------------- ROS/PFSH Details Patient Name: Dibert, Lakaisha C. Date of Service: 06/21/2018 2:00 PM Medical Record Number: [id number] Patient Account Number: [id number] Date of Birth/Sex: October 16, 1927 (85 y.o. F) Treating RN: [name] Primary Care Provider: Ria [name] Other Clinician: Referring Provider: Ria [name] Treating Provider/Extender: [name] Weeks in Treatment: 12 Information Obtained From Patient Constitutional Symptoms (General Health) Complaints and Symptoms: Negative for: Fever; Chills Medical History: Past Medical History Notes: High Blood Pressure, open heart surgery, gall stone, hysterectomy; Fluid pill, Anxiety Eyes Medical History: Positive for: Cataracts  - removed Negative for: Glaucoma; Optic Neuritis Hematologic/Lymphatic Medical History: Positive for: Anemia Negative for: Hemophilia; Human Immunodeficiency Virus; Lymphedema; Sickle Cell Disease Respiratory Complaints and Symptoms: No Complaints or Symptoms Medical History: Negative for: Aspiration; Asthma; Chronic Obstructive Pulmonary Disease (COPD); Pneumothorax; Sleep Apnea; Tuberculosis Cardiovascular Complaints and Symptoms: No Complaints or Symptoms Medical History: Positive for: Arrhythmia; Coronary Artery Disease; Hypertension; Myocardial Infarction Negative for: Angina; Congestive Heart Failure; Deep Vein Thrombosis; Hypotension; Peripheral Arterial Disease; Peripheral Venous Disease; Phlebitis Endocrine Medical History: Positive for: Type II Diabetes Negative for: Type I Diabetes Time with diabetes: no idea Treated with: Oral agents Ekholm, Keshara C. ([id number]) Blood sugar tested every day: No Genitourinary Medical History: Negative for: End Stage Renal Disease Immunological Medical History: Negative for: Lupus Erythematosus; Raynaudos; Scleroderma Integumentary (Skin) Medical History: Negative for: History of Burn; History of pressure wounds Musculoskeletal Medical History: Positive for: Osteoarthritis Negative for: Gout; Rheumatoid Arthritis; Osteomyelitis Neurologic Medical History: Negative for: Dementia; Neuropathy; Quadriplegia; Paraplegia; Seizure Disorder Oncologic Medical History: Negative for: Received Chemotherapy; Received Radiation Psychiatric Complaints and Symptoms: No Complaints or Symptoms HBO Extended History Items Eyes: Cataracts Immunizations Pneumococcal Vaccine: Received Pneumococcal Vaccination: Yes Implantable Devices No devices added Family and Social History Cancer: No; Diabetes: Yes - Father,Siblings,Child; Heart Disease: No; Hypertension: Yes - Child; Kidney Disease: No; Lung Disease: Yes - Child; Seizures: No;  Stroke: Yes - Mother; Thyroid Problems: No; Former smoker; Marital Status - Single; Alcohol Use: Never; Drug Use: No History; Caffeine Use: Daily; Financial Concerns: No; Food, Clothing or Shelter Needs: No; Support System Lacking: No; Transportation Concerns: No; Advanced Directives: Yes (Not Provided); Patient does not want information on Advanced Directives; Living Will: Yes (Not Provided) Physician Affirmation  I have reviewed and agree with the above information. RAIMI, GUILLERMO ([id number]) Electronic Signature(s) Signed: 06/22/2018 11:59:00 AM By: [name] Keeler PA-C Signed: 06/22/2018 2:42:08 PM By: [name] Entered By: [name] on 06/22/2018 11:44:56 Dershem, [name] ([id number]) -------------------------------------------------------------------------------- SuperBill Details Patient Name: Leeth, Shaquetta C. Date of Service: 06/21/2018 Medical Record Number: [id number] Patient Account Number: [id number] Date of Birth/Sex: 1927/09/19 (85 y.o. F) Treating RN: [name] Primary Care Provider: Ria [name] Other Clinician: Referring Provider: Ria [name] Treating Provider/Extender: [name] Weeks in Treatment: 12 Diagnosis Coding ICD-10 Codes Code Description I87.331 Chronic venous hypertension (idiopathic) with ulcer and inflammation of right lower extremity E11.622 Type 2 diabetes mellitus with other skin ulcer L97.812 Non-pressure chronic ulcer of other part of right lower leg with fat layer exposed L03.115 Cellulitis of right lower limb Facility Procedures CPT4 Code: 41423953 Description: 99213 - WOUND CARE VISIT-LEV 3 EST PT Modifier: Quantity: 1 Physician Procedures CPT4: Description Modifier Quantity Code 2023343 99214 - WC PHYS LEVEL 4 - EST PT 1 ICD-10 Diagnosis Description I87.331 Chronic venous hypertension (idiopathic) with ulcer and inflammation of right lower extremity E11.622 Type 2 diabetes mellitus with  other skin ulcer L97.812  Non-pressure chronic ulcer of other part of right lower leg with fat layer exposed L03.115 Cellulitis of right lower limb Electronic Signature(s) Signed: 06/22/2018 11:59:00 AM By: [name] Keeler PA-C Entered By: [name] on 06/22/2018 11:45:26

## 2018-06-26 ENCOUNTER — Encounter (INDEPENDENT_AMBULATORY_CARE_PROVIDER_SITE_OTHER): Payer: Medicare Other | Admitting: Vascular Surgery

## 2018-07-02 ENCOUNTER — Other Ambulatory Visit: Payer: Self-pay

## 2018-07-02 ENCOUNTER — Encounter: Payer: Medicare Other | Admitting: Physician Assistant

## 2018-07-02 DIAGNOSIS — L03115 Cellulitis of right lower limb: Secondary | ICD-10-CM | POA: Diagnosis not present

## 2018-07-02 DIAGNOSIS — E11622 Type 2 diabetes mellitus with other skin ulcer: Secondary | ICD-10-CM | POA: Diagnosis not present

## 2018-07-02 DIAGNOSIS — I252 Old myocardial infarction: Secondary | ICD-10-CM | POA: Diagnosis not present

## 2018-07-02 DIAGNOSIS — M1990 Unspecified osteoarthritis, unspecified site: Secondary | ICD-10-CM | POA: Diagnosis not present

## 2018-07-02 DIAGNOSIS — L97812 Non-pressure chronic ulcer of other part of right lower leg with fat layer exposed: Secondary | ICD-10-CM | POA: Diagnosis not present

## 2018-07-02 DIAGNOSIS — S91301A Unspecified open wound, right foot, initial encounter: Secondary | ICD-10-CM | POA: Diagnosis not present

## 2018-07-02 DIAGNOSIS — I87331 Chronic venous hypertension (idiopathic) with ulcer and inflammation of right lower extremity: Secondary | ICD-10-CM | POA: Diagnosis not present

## 2018-07-03 NOTE — Progress Notes (Signed)
ALISE, CALAIS ([id number]) Visit Report for 07/02/2018 Chief Complaint Document Details Patient Name: [name], [name] Date of Service: 07/02/2018 12:30 PM Medical Record Number: [id number] Patient Account Number: [id number] Date of Birth/Sex: 21-Dec-1927 (85 y.o. F) Treating RN: Harold Barban Primary Care Provider: Ria Bush Other Clinician: Referring Provider: Ria Bush Treating Provider/Extender: Melburn Hake, HOYT Weeks in Treatment: 14 Information Obtained from: Patient Chief Complaint RLE wounds Electronic Signature(s) Signed: 07/03/2018 8:30:49 AM By: Worthy Keeler PA-C Entered By: Worthy Keeler on 07/02/2018 12:54:43 Violet, [name] ([id number]) -------------------------------------------------------------------------------- HPI Details Patient Name: [name], [name] Date of Service: 07/02/2018 12:30 PM Medical Record Number: [id number] Patient Account Number: [id number] Date of Birth/Sex: 09-Oct-1927 (85 y.o. F) Treating RN: Harold Barban Primary Care Provider: Ria Bush Other Clinician: Referring Provider: Ria Bush Treating Provider/Extender: Melburn Hake, HOYT Weeks in Treatment: 14 History of Present Illness HPI Description: Readmission: 03/23/18 on evaluation today patient presents for readmission entire clinic concerning issues that she has been having for the past couple of weeks in regard to her right lower extremity. The medial injury actually occurred when her dog tried to take a blanket from her and subsequently scratched her with his toenail. Subsequently the lateral injury was a wound where she dropped a knife in her kitchen and hit the side of her leg. With that being said both have been open for some time and home health nurse that has been coming out marked for her where the redness was several days ago. Nonetheless this has spread will be on the margin of what was marked. She was placed on Keflex which was started two days  ago but at this point it does not seem to have helped with any improvement in the overall appearance of the wounds. No fevers, chills, nausea, or vomiting noted at this time. 04/02/18 Seen today for follow up and management of right lower extremity wounds. Right lateral wound with adherent slough. She is complaining of pain to touch which limited exam. Recently treated in the ED for cellulitis and wound culture positive for MRSA. Completed a dose of Vancomycin and transitioned to doxycycline by mouth. Erythema remains present with improvement since last visit. Right medial wound with scab present. No fevers, chills, nausea, or vomiting noted at this time. 04/10/18 on evaluation today patient appears to be doing a Harkins better in regard to her right lateral lower [name]. The slough does seem to be coming off although it is slow from the wound bed. Fortunately there does not appear to be any evidence of infection at this time which is good news. With that being said I do believe that she is going to require longer treatment with the Santyl simply due to the fact that I'm not able to really debride the wound due to the pain that she is experiencing. 04/16/18 upon evaluation today patient's wound bed currently on the lateral lower extremity on the right appears to be doing a Wildermuth better at this point. We are utilizing sensible currently. Overall I'm very pleased with how things are progressing although there still is some work to do before this will be completely healed. No fevers, chills, nausea, or vomiting noted at this time. 04/23/18 on evaluation today patient appears to be doing about the same at this point maybe just a Kersting bit smaller than last week's evaluation. She continues to have some slough buildup on the surface of the wound and has very Suchocki granulation unfortunately. This seems to be very slow moving  I'm getting somewhat concerned about the possibility of her  having decreased blood flow which is leading to her subsequently not being able to heal this area as quickly as she shut otherwise. She is currently utilizing Santyl. 05/01/18 on evaluation today patient appears to be doing somewhat poorly in regard to her lower extremity ulcer. At this point she's been tolerating the dressing changes to some degree without complication. With that being said she does note of the past several days that she has been having more pain unfortunately. On inspection it appears that she may have signs of infection which would account for the pain. 05/07/18 on evaluation today patient's wound actually appears to be doing better she's not having as much erythema in the right lower extremity as compared to last week. Fortunately there's no signs of infection this is good news as well. Overall very pleased with the progress that she has made up to this point. 05/14/18 on evaluation today patient actually appears to be doing rather well in regard to her right lower extremity ulcer. She does not have erythema that was characterizing the cellulitis prior to initiation of antibiotic therapy. With that being said her wound still appears to be somewhat slow in healing I believe this may be arterial in nature that is my concern at least. With that being said I do want her to see the vascular specialist as soon as possible we been trying to get this set up I'm not sure if she's not answering the phone or what exactly is going on here. [name] ([id number]) 05/21/18 on evaluation today patient's wound actually appears to be doing a Mccaughey better possibly some additional granulation compared to my last evaluation. With that being said she still continues to have a lot of pain especially when her legs are elevated such as sitting in the chair here in the office. There does not appear to be any signs of infection which is good news. 05/28/18 on evaluation today patient appears to be  doing a Seaborn better in regard to her right lateral lower [name]. Again she was supposed to have an appointment with vascular unfortunately they attempted to call her three times and unfortunately were unable to get anything scheduled. That reason closed up for him. She tells me that if the phone is answered and she doesn't recognize what is that she will hang up as well those that she lives with. Obviously she's not gonna recognize somebody calling from the office that she has not previously spoken with. Nonetheless the patient states that she does not remember any fall. I think if we're gonna get her in with vascular specialist we're gonna have to try to make the appointment for her. 06/04/2018 Seen today for follow up and management of right lateral lower extremity wound. Adherent slough present over surface of wound. Scheduled to follow up for ABI/TBI on 06/06/2018 with vein and vascular specialist. She is hard of hearing. Wound is sensitive to touch. No signs or symptoms of infection. Denies any recent fevers, chills, or SOB. 06/11/18 on evaluation today patient actually appears to be doing very well in regard to her right lateral lower extremity ulcer. She's been tolerating the dressing changes without complication. Fortunately there's no evidence of infection. With that being said she did have her arterial studies which revealed that she had a very low ABI which was 0.67 on the right with a TBI of 0.32 and 0.65 on the left with a TBI of 0.16. I  do believe she needs to see vascular and we are working on that referral as well. 06/21/18 on evaluation today patient's wound actually appears to be doing okay she does have an appointment with vascular coming up on the 24th. Nonetheless she's been having some issues that are more urinary in nature right now that are causing trouble for her. Fortunately there's no signs of severe sepsis although she is running a low-grade temperature 99.9  on evaluation today. She also is having some issues right now with chills as well as urinary urgency and frequency. Again I feel like the city is more urinary and symptomology I do not believe she has anything upper respiratory which is good news. Nonetheless I think this may need to be checked out more quickly especially in light of the fact that she's having significant back pain at this point. 07/02/18 on evaluation today patient appears to be doing okay in regard to her ulcer on the right lower extremity. She's been tolerating the dressing changes without complication. Fortunately there does not appear to be any signs of infection which is good news. No fevers, chills, nausea, or vomiting noted at this time. Electronic Signature(s) Signed: 07/03/2018 8:30:49 AM By: Worthy Keeler PA-C Entered By: Worthy Keeler on 07/02/2018 13:04:58 Marrin, [name] ([id number]) -------------------------------------------------------------------------------- Physical Exam Details Patient Name: Fraleigh, Jyssica C. Date of Service: 07/02/2018 12:30 PM Medical Record Number: [id number] Patient Account Number: [id number] Date of Birth/Sex: Mar 13, 1928 (85 y.o. F) Treating RN: Harold Barban Primary Care Provider: Ria Bush Other Clinician: Referring Provider: Ria Bush Treating Provider/Extender: STONE III, HOYT Weeks in Treatment: 10 Constitutional Well-nourished and well-hydrated in no acute distress. Respiratory normal breathing without difficulty. clear to auscultation bilaterally. Cardiovascular regular rate and rhythm with normal S1, S2. Psychiatric this patient is able to make decisions and demonstrates good insight into disease process. Alert and Oriented x 3. pleasant and cooperative. Notes Patient's wound bed actually showed signs of some Slough noted on the surface of the wound she just been using triple antibiotic ointment on this and in covenant with a galls dressing on her  own securing the tape. With that being said the tape is irritating her skin a Sangha bit I think she would do better with a Boarder Foam Dressing and then we could actually have her third piece of Tubigrip over top of this she cannot put it on with the gauze and tape but I think this will make it much easier. She's in agreement with giving this a try. Electronic Signature(s) Signed: 07/03/2018 8:30:49 AM By: Worthy Keeler PA-C Entered By: Worthy Keeler on 07/02/2018 13:05:34 Warmack, [name] ([id number]) -------------------------------------------------------------------------------- Physician Orders Details Patient Name: Binning, Abbigaile C. Date of Service: 07/02/2018 12:30 PM Medical Record Number: [id number] Patient Account Number: [id number] Date of Birth/Sex: 10/27/1927 (85 y.o. F) Treating RN: Harold Barban Primary Care Provider: Ria Bush Other Clinician: Referring Provider: Ria Bush Treating Provider/Extender: Melburn Hake, HOYT Weeks in Treatment: 14 Verbal / Phone Orders: No Diagnosis Coding ICD-10 Coding Code Description I87.331 Chronic venous hypertension (idiopathic) with ulcer and inflammation of right lower extremity E11.622 Type 2 diabetes mellitus with other skin ulcer L97.812 Non-pressure chronic ulcer of other part of right lower leg with fat layer exposed L03.115 Cellulitis of right lower limb Wound Cleansing Wound #8 Right,Lateral Lower Leg o Clean wound with Normal Saline. Wound #9 Right,Dorsal Foot o Clean wound with Normal Saline. Anesthetic (add to Medication List) Wound #8 Right,Lateral Lower Leg o Topical Lidocaine 4%  cream applied to wound bed prior to debridement (In Clinic Only). Wound #9 Right,Dorsal Foot o Topical Lidocaine 4% cream applied to wound bed prior to debridement (In Clinic Only). Primary Wound Dressing Wound #8 Right,Lateral Lower Leg o Other: - Antibiotic ointment Wound #9 Right,Dorsal Foot o Other: -  Antibiotic ointment Secondary Dressing Wound #8 Right,Lateral Lower Leg o Boardered Foam Dressing Dressing Change Frequency Wound #8 Right,Lateral Lower Leg o Change dressing every other day. Wound #9 Right,Dorsal Foot o Change dressing every other day. Follow-up Appointments Wound #8 Right,Lateral Lower Leg o Return Appointment in 1 week. Rossini, Lamica C. ([id number]) Wound #9 Right,Dorsal Foot o Return Appointment in 1 week. Edema Control Wound #8 Right,Lateral Lower Leg o Elevate legs to the level of the heart and pump ankles as often as possible o Other: - tubigrip Wound #9 Right,Dorsal Foot o Elevate legs to the level of the heart and pump ankles as often as possible o Other: - tubigrip Electronic Signature(s) Signed: 07/02/2018 3:00:04 PM By: Harold Barban Signed: 07/03/2018 8:30:49 AM By: Worthy Keeler PA-C Entered By: Harold Barban on 07/02/2018 13:03:57 Castrogiovanni, Jenaya C. ([id number]) -------------------------------------------------------------------------------- Problem List Details Patient Name: Mclaren, Ersie C. Date of Service: 07/02/2018 12:30 PM Medical Record Number: [id number] Patient Account Number: [id number] Date of Birth/Sex: 04/06/1927 (85 y.o. F) Treating RN: Harold Barban Primary Care Provider: Ria Bush Other Clinician: Referring Provider: Ria Bush Treating Provider/Extender: Melburn Hake, HOYT Weeks in Treatment: 14 Active Problems ICD-10 Evaluated Encounter Code Description Active Date Today Diagnosis I87.331 Chronic venous hypertension (idiopathic) with ulcer and 03/23/2018 No Yes inflammation of right lower extremity E11.622 Type 2 diabetes mellitus with other skin ulcer 03/23/2018 No Yes L97.812 Non-pressure chronic ulcer of other part of right lower leg 03/23/2018 No Yes with fat layer exposed L03.115 Cellulitis of right lower limb 03/23/2018 No Yes Inactive Problems Resolved Problems Electronic  Signature(s) Signed: 07/03/2018 8:30:49 AM By: Worthy Keeler PA-C Entered By: Worthy Keeler on 07/02/2018 12:54:35 Deberry, [name] ([id number]) -------------------------------------------------------------------------------- Progress Note Details Patient Name: Minami, Letasha C. Date of Service: 07/02/2018 12:30 PM Medical Record Number: [id number] Patient Account Number: [id number] Date of Birth/Sex: [birth year] (85 y.o. F) Treating RN: Harold Barban Primary Care Provider: Ria Bush Other Clinician: Referring Provider: Ria Bush Treating Provider/Extender: Melburn Hake, HOYT Weeks in Treatment: 14 Subjective Chief Complaint Information obtained from Patient RLE wounds History of Present Illness (HPI) Readmission: 03/23/18 on evaluation today patient presents for readmission entire clinic concerning issues that she has been having for the past couple of weeks in regard to her right lower extremity. The medial injury actually occurred when her dog tried to take a blanket from her and subsequently scratched her with his toenail. Subsequently the lateral injury was a wound where she dropped a knife in her kitchen and hit the side of her leg. With that being said both have been open for some time and home health nurse that has been coming out marked for her where the redness was several days ago. Nonetheless this has spread will be on the margin of what was marked. She was placed on Keflex which was started two days ago but at this point it does not seem to have helped with any improvement in the overall appearance of the wounds. No fevers, chills, nausea, or vomiting noted at this time. 04/02/18 Seen today for follow up and management of right lower extremity wounds. Right lateral wound with adherent slough. She is complaining of pain to touch which limited exam.  Recently treated in the ED for cellulitis and wound culture positive for MRSA. Completed a dose of Vancomycin  and transitioned to doxycycline by mouth. Erythema remains present with improvement since last visit. Right medial wound with scab present. No fevers, chills, nausea, or vomiting noted at this time. 04/10/18 on evaluation today patient appears to be doing a Mcquiston better in regard to her right lateral lower [name]. The slough does seem to be coming off although it is slow from the wound bed. Fortunately there does not appear to be any evidence of infection at this time which is good news. With that being said I do believe that she is going to require longer treatment with the Santyl simply due to the fact that I'm not able to really debride the wound due to the pain that she is experiencing. 04/16/18 upon evaluation today patient's wound bed currently on the lateral lower extremity on the right appears to be doing a Genna better at this point. We are utilizing sensible currently. Overall I'm very pleased with how things are progressing although there still is some work to do before this will be completely healed. No fevers, chills, nausea, or vomiting noted at this time. 04/23/18 on evaluation today patient appears to be doing about the same at this point maybe just a Neumeier bit smaller than last week's evaluation. She continues to have some slough buildup on the surface of the wound and has very Mendez granulation unfortunately. This seems to be very slow moving I'm getting somewhat concerned about the possibility of her having decreased blood flow which is leading to her subsequently not being able to heal this area as quickly as she shut otherwise. She is currently utilizing Santyl. 05/01/18 on evaluation today patient appears to be doing somewhat poorly in regard to her lower extremity ulcer. At this point she's been tolerating the dressing changes to some degree without complication. With that being said she does note of the past several days that she has been having more pain  unfortunately. On inspection it appears that she may have signs of infection which would account for the pain. 05/07/18 on evaluation today patient's wound actually appears to be doing better she's not having as much erythema in the right lower extremity as compared to last week. Fortunately there's no signs of infection this is good news as well. Overall very pleased with the progress that she has made up to this point. SHAWANA, KNOCH ([id number]) 05/14/18 on evaluation today patient actually appears to be doing rather well in regard to her right lower extremity ulcer. She does not have erythema that was characterizing the cellulitis prior to initiation of antibiotic therapy. With that being said her wound still appears to be somewhat slow in healing I believe this may be arterial in nature that is my concern at least. With that being said I do want her to see the vascular specialist as soon as possible we been trying to get this set up I'm not sure if she's not answering the phone or what exactly is going on here. 05/21/18 on evaluation today patient's wound actually appears to be doing a Rowton better possibly some additional granulation compared to my last evaluation. With that being said she still continues to have a lot of pain especially when her legs are elevated such as sitting in the chair here in the office. There does not appear to be any signs of infection which is good news. 05/28/18 on evaluation today patient  appears to be doing a Choe better in regard to her right lateral lower [name]. Again she was supposed to have an appointment with vascular unfortunately they attempted to call her three times and unfortunately were unable to get anything scheduled. That reason closed up for him. She tells me that if the phone is answered and she doesn't recognize what is that she will hang up as well those that she lives with. Obviously she's not gonna recognize somebody calling from the  office that she has not previously spoken with. Nonetheless the patient states that she does not remember any fall. I think if we're gonna get her in with vascular specialist we're gonna have to try to make the appointment for her. 06/04/2018 Seen today for follow up and management of right lateral lower extremity wound. Adherent slough present over surface of wound. Scheduled to follow up for ABI/TBI on 06/06/2018 with vein and vascular specialist. She is hard of hearing. Wound is sensitive to touch. No signs or symptoms of infection. Denies any recent fevers, chills, or SOB. 06/11/18 on evaluation today patient actually appears to be doing very well in regard to her right lateral lower extremity ulcer. She's been tolerating the dressing changes without complication. Fortunately there's no evidence of infection. With that being said she did have her arterial studies which revealed that she had a very low ABI which was 0.67 on the right with a TBI of 0.32 and 0.65 on the left with a TBI of 0.16. I do believe she needs to see vascular and we are working on that referral as well. 06/21/18 on evaluation today patient's wound actually appears to be doing okay she does have an appointment with vascular coming up on the 24th. Nonetheless she's been having some issues that are more urinary in nature right now that are causing trouble for her. Fortunately there's no signs of severe sepsis although she is running a low-grade temperature 99.9 on evaluation today. She also is having some issues right now with chills as well as urinary urgency and frequency. Again I feel like the city is more urinary and symptomology I do not believe she has anything upper respiratory which is good news. Nonetheless I think this may need to be checked out more quickly especially in light of the fact that she's having significant back pain at this point. 07/02/18 on evaluation today patient appears to be doing okay in regard to her  ulcer on the right lower extremity. She's been tolerating the dressing changes without complication. Fortunately there does not appear to be any signs of infection which is good news. No fevers, chills, nausea, or vomiting noted at this time. Patient History Information obtained from Patient. Family History Diabetes - Father,Siblings,Child, Hypertension - Child, Lung Disease - Child, Stroke - Mother, No family history of Cancer, Heart Disease, Kidney Disease, Seizures, Thyroid Problems. Social History Former smoker, Marital Status - Single, Alcohol Use - Never, Drug Use - No History, Caffeine Use - Daily. Medical History Eyes Patient has history of Cataracts - removed Denies history of Glaucoma, Optic Neuritis Hematologic/Lymphatic Patient has history of Anemia Denies history of Hemophilia, Human Immunodeficiency Virus, Lymphedema, Sickle Cell Disease Respiratory Irigoyen, Oluwatoyin C. ([id number]) Denies history of Aspiration, Asthma, Chronic Obstructive Pulmonary Disease (COPD), Pneumothorax, Sleep Apnea, Tuberculosis Cardiovascular Patient has history of Arrhythmia, Coronary Artery Disease, Hypertension, Myocardial Infarction Denies history of Angina, Congestive Heart Failure, Deep Vein Thrombosis, Hypotension, Peripheral Arterial Disease, Peripheral Venous Disease, Phlebitis Endocrine Patient has history of  Type II Diabetes Denies history of Type I Diabetes Genitourinary Denies history of End Stage Renal Disease Immunological Denies history of Lupus Erythematosus, Raynaud s, Scleroderma Integumentary (Skin) Denies history of History of Burn, History of pressure wounds Musculoskeletal Patient has history of Osteoarthritis Denies history of Gout, Rheumatoid Arthritis, Osteomyelitis Neurologic Denies history of Dementia, Neuropathy, Quadriplegia, Paraplegia, Seizure Disorder Oncologic Denies history of Received Chemotherapy, Received Radiation Medical And Surgical History  Notes Constitutional Symptoms (General Health) High Blood Pressure, open heart surgery, gall stone, hysterectomy; Fluid pill, Anxiety Review of Systems (ROS) Constitutional Symptoms (General Health) Denies complaints or symptoms of Fever, Chills. Respiratory The patient has no complaints or symptoms. Cardiovascular Complains or has symptoms of LE edema. Psychiatric The patient has no complaints or symptoms. Objective Constitutional Well-nourished and well-hydrated in no acute distress. Vitals Time Taken: 12:44 PM, Height: 58 in, Weight: 140 lbs, BMI: 29.3, Temperature: 97.9 F, Pulse: 71 bpm, Respiratory Rate: 18 breaths/min, Blood Pressure: 110/68 mmHg. Respiratory normal breathing without difficulty. clear to auscultation bilaterally. Cardiovascular regular rate and rhythm with normal S1, S2. Goldsmith, Reona C. ([id number]) Psychiatric this patient is able to make decisions and demonstrates good insight into disease process. Alert and Oriented x 3. pleasant and cooperative. General Notes: Patient's wound bed actually showed signs of some Slough noted on the surface of the wound she just been using triple antibiotic ointment on this and in covenant with a galls dressing on her own securing the tape. With that being said the tape is irritating her skin a Granillo bit I think she would do better with a Boarder Foam Dressing and then we could actually have her third piece of Tubigrip over top of this she cannot put it on with the gauze and tape but I think this will make it much easier. She's in agreement with giving this a try. Integumentary (Hair, Skin) Wound #8 status is Open. Original cause of wound was Trauma. The wound is located on the Right,Lateral Lower Leg. The wound measures 3cm length x 1.2cm width x 0.1cm depth; 2.827cm^2 area and 0.283cm^3 volume. There is Fat Layer (Subcutaneous Tissue) Exposed exposed. There is no tunneling or undermining noted. There is a medium amount  of purulent drainage noted. The wound margin is flat and intact. There is no granulation within the wound bed. There is a large (67-100%) amount of necrotic tissue within the wound bed including Adherent Slough. The periwound skin appearance exhibited: Excoriation, Dry/Scaly, Erythema. The periwound skin appearance did not exhibit: Callus, Crepitus, Induration, Rash, Scarring, Maceration, Atrophie Blanche, Cyanosis, Ecchymosis, Hemosiderin Staining, Mottled, Pallor, Rubor. The surrounding wound skin color is noted with erythema which is circumferential. Periwound temperature was noted as No Abnormality. The periwound has tenderness on palpation. Wound #9 status is Open. Original cause of wound was Trauma. The wound is located on the Right,Dorsal Foot. The wound measures 0.2cm length x 0.5cm width x 0.1cm depth; 0.079cm^2 area and 0.008cm^3 volume. The wound is limited to skin breakdown. There is no tunneling or undermining noted. There is a none present amount of drainage noted. The wound margin is flat and intact. There is no granulation within the wound bed. There is a large (67-100%) amount of necrotic tissue within the wound bed including Eschar. The periwound skin appearance did not exhibit: Callus, Crepitus, Excoriation, Induration, Rash, Scarring, Dry/Scaly, Maceration, Atrophie Blanche, Cyanosis, Ecchymosis, Hemosiderin Staining, Mottled, Pallor, Rubor, Erythema. Periwound temperature was noted as No Abnormality. The periwound has tenderness on palpation. Assessment Active Problems ICD-10 Chronic venous hypertension (  idiopathic) with ulcer and inflammation of right lower extremity Type 2 diabetes mellitus with other skin ulcer Non-pressure chronic ulcer of other part of right lower leg with fat layer exposed Cellulitis of right lower limb Plan Wound Cleansing: Wound #8 Right,Lateral Lower Leg: Clean wound with Normal Saline. Wound #9 Right,Dorsal Foot: Clean wound with Normal  Saline. Anesthetic (add to Medication List): Wound #8 Right,Lateral Lower Leg: Cui, Abie C. ([id number]) Topical Lidocaine 4% cream applied to wound bed prior to debridement (In Clinic Only). Wound #9 Right,Dorsal Foot: Topical Lidocaine 4% cream applied to wound bed prior to debridement (In Clinic Only). Primary Wound Dressing: Wound #8 Right,Lateral Lower Leg: Other: - Antibiotic ointment Wound #9 Right,Dorsal Foot: Other: - Antibiotic ointment Secondary Dressing: Wound #8 Right,Lateral Lower Leg: Boardered Foam Dressing Dressing Change Frequency: Wound #8 Right,Lateral Lower Leg: Change dressing every other day. Wound #9 Right,Dorsal Foot: Change dressing every other day. Follow-up Appointments: Wound #8 Right,Lateral Lower Leg: Return Appointment in 1 week. Wound #9 Right,Dorsal Foot: Return Appointment in 1 week. Edema Control: Wound #8 Right,Lateral Lower Leg: Elevate legs to the level of the heart and pump ankles as often as possible Other: - tubigrip Wound #9 Right,Dorsal Foot: Elevate legs to the level of the heart and pump ankles as often as possible Other: - tubigrip We're gonna see were things stand at follow-up. I will subsequently see the patient back for reevaluation in one weeks time to see were things stand. If anything changes worsens you let me know if otherwise I'm hopeful that the Boarder Foam Dressing with the silicon will be better than tape and will not irritate her skin. Otherwise I'm hopeful also this be something easier for her to take care of herself which she's pretty much having to do at this point anyway. Please see above for specific wound care orders. We will see patient for re-evaluation in 1 week(s) here in the clinic. If anything worsens or changes patient will contact our office for additional recommendations. Electronic Signature(s) Signed: 07/03/2018 8:30:49 AM By: Worthy Keeler PA-C Entered By: Worthy Keeler on 07/02/2018  13:06:07 Mangen, [name] ([id number]) -------------------------------------------------------------------------------- ROS/PFSH Details Patient Name: Quadros, Anastasia C. Date of Service: 07/02/2018 12:30 PM Medical Record Number: [id number] Patient Account Number: [id number] Date of Birth/Sex: 17-Aug-1927 (85 y.o. F) Treating RN: Harold Barban Primary Care Provider: Ria Bush Other Clinician: Referring Provider: Ria Bush Treating Provider/Extender: Melburn Hake, HOYT Weeks in Treatment: 14 Information Obtained From Patient Constitutional Symptoms (General Health) Complaints and Symptoms: Negative for: Fever; Chills Medical History: Past Medical History Notes: High Blood Pressure, open heart surgery, gall stone, hysterectomy; Fluid pill, Anxiety Cardiovascular Complaints and Symptoms: Positive for: LE edema Medical History: Positive for: Arrhythmia; Coronary Artery Disease; Hypertension; Myocardial Infarction Negative for: Angina; Congestive Heart Failure; Deep Vein Thrombosis; Hypotension; Peripheral Arterial Disease; Peripheral Venous Disease; Phlebitis Eyes Medical History: Positive for: Cataracts - removed Negative for: Glaucoma; Optic Neuritis Hematologic/Lymphatic Medical History: Positive for: Anemia Negative for: Hemophilia; Human Immunodeficiency Virus; Lymphedema; Sickle Cell Disease Respiratory Complaints and Symptoms: No Complaints or Symptoms Medical History: Negative for: Aspiration; Asthma; Chronic Obstructive Pulmonary Disease (COPD); Pneumothorax; Sleep Apnea; Tuberculosis Endocrine Medical History: Positive for: Type II Diabetes Negative for: Type I Diabetes Time with diabetes: no idea Treated with: Oral agents Bielicki, Tomeika C. ([id number]) Blood sugar tested every day: No Genitourinary Medical History: Negative for: End Stage Renal Disease Immunological Medical History: Negative for: Lupus Erythematosus; Raynaudos;  Scleroderma Integumentary (Skin) Medical History: Negative for: History of Burn;  History of pressure wounds Musculoskeletal Medical History: Positive for: Osteoarthritis Negative for: Gout; Rheumatoid Arthritis; Osteomyelitis Neurologic Medical History: Negative for: Dementia; Neuropathy; Quadriplegia; Paraplegia; Seizure Disorder Oncologic Medical History: Negative for: Received Chemotherapy; Received Radiation Psychiatric Complaints and Symptoms: No Complaints or Symptoms HBO Extended History Items Eyes: Cataracts Immunizations Pneumococcal Vaccine: Received Pneumococcal Vaccination: Yes Implantable Devices No devices added Family and Social History Cancer: No; Diabetes: Yes - Father,Siblings,Child; Heart Disease: No; Hypertension: Yes - Child; Kidney Disease: No; Lung Disease: Yes - Child; Seizures: No; Stroke: Yes - Mother; Thyroid Problems: No; Former smoker; Marital Status - Single; Alcohol Use: Never; Drug Use: No History; Caffeine Use: Daily; Financial Concerns: No; Food, Clothing or Shelter Needs: No; Support System Lacking: No; Transportation Concerns: No; Advanced Directives: Yes (Not Provided); Patient does not want information on Advanced Directives; Living Will: Yes (Not Provided) Physician Affirmation I have reviewed and agree with the above information. HODAYA, CURTO ([id number]) Electronic Signature(s) Signed: 07/02/2018 3:00:04 PM By: Harold Barban Signed: 07/03/2018 8:30:49 AM By: Worthy Keeler PA-C Entered By: Worthy Keeler on 07/02/2018 13:05:21 Weinfeld, [name] ([id number]) -------------------------------------------------------------------------------- SuperBill Details Patient Name: Vannest, Corena C. Date of Service: 07/02/2018 Medical Record Number: [id number] Patient Account Number: [id number] Date of Birth/Sex: 12-26-1927 (85 y.o. F) Treating RN: Harold Barban Primary Care Provider: Ria Bush Other Clinician: Referring  Provider: Ria Bush Treating Provider/Extender: Melburn Hake, HOYT Weeks in Treatment: 14 Diagnosis Coding ICD-10 Codes Code Description I87.331 Chronic venous hypertension (idiopathic) with ulcer and inflammation of right lower extremity E11.622 Type 2 diabetes mellitus with other skin ulcer L97.812 Non-pressure chronic ulcer of other part of right lower leg with fat layer exposed L03.115 Cellulitis of right lower limb Facility Procedures CPT4 Code: 14103013 Description: 99213 - WOUND CARE VISIT-LEV 3 EST PT Modifier: Quantity: 1 Physician Procedures CPT4: Description Modifier Quantity Code [id number] 99214 - WC PHYS LEVEL 4 - EST PT 1 ICD-10 Diagnosis Description I87.331 Chronic venous hypertension (idiopathic) with ulcer and inflammation of right lower extremity E11.622 Type 2 diabetes mellitus with  other skin ulcer L97.812 Non-pressure chronic ulcer of other part of right lower leg with fat layer exposed L03.115 Cellulitis of right lower limb Electronic Signature(s) Signed: 07/03/2018 8:30:49 AM By: Worthy Keeler PA-C Entered By: Worthy Keeler on 07/02/2018 13:06:19

## 2018-07-03 NOTE — Progress Notes (Signed)
[name] [name] ([id number]) Visit Report for 07/02/2018 Arrival Information Details Patient Name: [name] [name]. Date of Service: 07/02/2018 12:30 PM Medical Record Number: [id number] Patient Account Number: [id number] Date of Birth/Sex: 10-11-1927 (85 y.o. F) Treating RN: [name] [name] Primary Care [name] [name]: [name] [name] Other Clinician: Referring [name] [name]: [name] [name] Treating [name] [name] [name] Visit Information History Since Last Visit Added or deleted any medications: No Patient Arrived: Walker Any new allergies or adverse reactions: No Arrival Time: 12:40 Had a fall or experienced change in No Accompanied By: self activities of daily living that may affect Transfer Assistance: None risk of falls: Patient Identification Verified: Yes Signs or symptoms of abuse/neglect since last visito No Secondary Verification Process Completed: Yes Hospitalized since last visit: No Pain Present Now: No Electronic Signature(s) Signed: 07/02/2018 3:00:04 PM By: [name] [name] Entered By: [name] [name] on 07/02/2018 12:43:59 [name] [name] ([id number]) -------------------------------------------------------------------------------- Clinic Level of Care Assessment Details Patient Name: [name] [name]. Date of Service: 07/02/2018 12:30 PM Medical Record Number: [id number] Patient Account Number: [id number] Date of Birth/Sex: 01-03-1928 (85 y.o. F) Treating RN: [name] [name] Primary Care Devony Mcgrady: [name] [name] Other Clinician: Referring [name] [name]: [name] [name] Treating [name] [name] [name] Clinic Level of Care Assessment Items TOOL 4 Quantity Score []  - Use when only an EandM is performed on FOLLOW-UP visit 0 ASSESSMENTS - Nursing Assessment / Reassessment []  - Reassessment of Co-morbidities (includes updates in patient status) 0 []  - 0 Reassessment of Adherence to Treatment  Plan ASSESSMENTS - Wound and Skin Assessment / Reassessment []  - Simple Wound Assessment / Reassessment - one wound 0 []  - 0 Complex Wound Assessment / Reassessment - multiple wounds []  - 0 Dermatologic / Skin Assessment (not related to wound area) ASSESSMENTS - Focused Assessment []  - Circumferential Edema Measurements - multi extremities 0 []  - 0 Nutritional Assessment / Counseling / Intervention []  - 0 Lower Extremity Assessment (monofilament, tuning fork, pulses) []  - 0 Peripheral Arterial Disease Assessment (using hand held doppler) ASSESSMENTS - Ostomy and/or Continence Assessment and Care []  - Incontinence Assessment and Management 0 []  - 0 Ostomy Care Assessment and Management (repouching, etc.) PROCESS - Coordination of Care X - Simple Patient / Family Education for ongoing care 1 15 []  - 0 Complex (extensive) Patient / Family Education for ongoing care []  - 0 Staff obtains Programmer, systems, Records, Test Results / Process Orders []  - 0 Staff telephones HHA, Nursing Homes / Clarify orders / etc []  - 0 Routine Transfer to another Facility (non-emergent condition) []  - 0 Routine Hospital Admission (non-emergent condition) []  - 0 New Admissions / Biomedical engineer / Ordering NPWT, Apligraf, etc. []  - 0 Emergency Hospital Admission (emergent condition) X- 1 10 Simple Discharge Coordination [name] [name]. ([id number]) []  - 0 Complex (extensive) Discharge Coordination PROCESS - Special Needs []  - Pediatric / Minor Patient Management 0 []  - 0 Isolation Patient Management []  - 0 Hearing / Language / Visual special needs []  - 0 Assessment of Community assistance (transportation, D/[name] planning, etc.) []  - 0 Additional assistance / Altered mentation []  - 0 Support Surface(s) Assessment (bed, cushion, seat, etc.) INTERVENTIONS - Wound Cleansing / Measurement []  - Simple Wound Cleansing - one wound 0 X- 2 5 Complex Wound Cleansing - multiple wounds X- 1 5 Wound  Imaging (photographs - any number of wounds) []  - 0 Wound Tracing (instead of photographs) []  - 0 Simple Wound Measurement - one wound X- 2 5 Complex Wound Measurement -  multiple wounds INTERVENTIONS - Wound Dressings []  - Small Wound Dressing one or multiple wounds 0 X- 2 15 Medium Wound Dressing one or multiple wounds []  - 0 Large Wound Dressing one or multiple wounds []  - 0 Application of Medications - topical []  - 0 Application of Medications - injection INTERVENTIONS - Miscellaneous []  - External ear exam 0 []  - 0 Specimen Collection (cultures, biopsies, blood, body fluids, etc.) []  - 0 Specimen(s) / Culture(s) sent or taken to Lab for analysis []  - 0 Patient Transfer (multiple staff / Civil Service fast streamer / Similar devices) []  - 0 Simple Staple / Suture removal (25 or less) []  - 0 Complex Staple / Suture removal (26 or more) []  - 0 Hypo / Hyperglycemic Management (close monitor of Blood Glucose) []  - 0 Ankle / Brachial Index (ABI) - do not check if billed separately X- 1 5 Vital Signs [name] [name]. ([id number]) Has the patient been seen at the hospital within the last three years: Yes Total Score: 85 Level Of Care: New/Established - Level 3 Electronic Signature(s) Signed: 07/02/2018 3:00:04 PM By: [name] [name] Entered By: [name] [name] on 07/02/2018 13:00:48 [name] [name] ([id number]) -------------------------------------------------------------------------------- Encounter Discharge Information Details Patient Name: [name] [name]. Date of Service: 07/02/2018 12:30 PM Medical Record Number: [id number] Patient Account Number: [id number] Date of Birth/Sex: [birth year] (85 y.o. F) Treating RN: Army Melia Primary Care Jeovani Weisenburger: [name] [name] Other Clinician: Referring Kashaun Bebo: [name] [name] Treating Tyrone Balash/Extender: [name], [name] [name] Encounter Discharge Information Items Discharge Condition: Stable Ambulatory Status:  Walker Discharge Destination: Home Transportation: Private Auto Accompanied By: self Schedule Follow-up Appointment: Yes Clinical Summary of Care: Electronic Signature(s) Signed: 07/02/2018 2:01:01 PM By: Army Melia Entered By: Army Melia on 07/02/2018 13:13:57 [name] [name] ([id number]) -------------------------------------------------------------------------------- Lower Extremity Assessment Details Patient Name: Medlock, Misa [name]. Date of Service: 07/02/2018 12:30 PM Medical Record Number: [id number] Patient Account Number: [id number] Date of Birth/Sex: 1927/05/26 (85 y.o. F) Treating RN: [name] [name] Primary Care Radley Barto: [name] [name] Other Clinician: Referring Kyonna Frier: [name] [name] Treating Jakari Sada/Extender: [name], [name] [name] Edema Assessment Assessed: [Left: No] [Right: No] [Left: Edema] [Right: :] Calf Left: Right: Point of Measurement: 30 cm From Medial Instep cm 27.9 cm Ankle Left: Right: Point of Measurement: 12 cm From Medial Instep cm 20 cm Vascular Assessment Pulses: Dorsalis Pedis Palpable: [Right:Yes] Posterior Tibial Palpable: [Right:Yes] Extremity colors, hair growth, and conditions: Extremity Color: [Right:Red] Hair Growth on Extremity: [Right:No] Temperature of Extremity: [Right:Warm < 3 seconds] Toe Nail Assessment Left: Right: Thick: Yes Discolored: Yes Deformed: Yes Improper Length and Hygiene: No Electronic Signature(s) Signed: 07/02/2018 3:00:04 PM By: [name] [name] Entered By: [name] [name] on 07/02/2018 12:50:21 Scalise, [name] CMarland Kitchen ([id number]) -------------------------------------------------------------------------------- Multi Wound Chart Details Patient Name: [name] [name]. Date of Service: 07/02/2018 12:30 PM Medical Record Number: [id number] Patient Account Number: [id number] Date of Birth/Sex: 27-Jun-1927 (85 y.o. F) Treating RN: [name] [name] Primary Care Kili Gracy: [name] [name]  Other Clinician: Referring Quincey Nored: [name] [name] Treating Soloman Mckeithan/Extender: STONE III, [name] [name] Vital Signs Height(in): 68 Pulse(bpm): 44 Weight(lbs): 140 Blood Pressure(mmHg): 110/68 Body Mass Index(BMI): 29 Temperature(F): 97.9 Respiratory Rate 18 (breaths/min): Photos: [N/A:N/A] Wound Location: Right Lower Leg - Lateral Right Foot - Dorsal N/A Wounding Event: Trauma Trauma N/A Primary Etiology: Diabetic Wound/Ulcer of the Trauma, Other N/A Lower Extremity Comorbid History: Cataracts, Anemia, Cataracts, Anemia, N/A Arrhythmia, Coronary Artery Arrhythmia, Coronary Artery Disease, Hypertension, Disease, Hypertension, Myocardial Infarction, Type II Myocardial Infarction, Type II Diabetes, Osteoarthritis Diabetes,  Osteoarthritis Date Acquired: 03/09/2018 06/10/2018 N/A Weeks of Treatment: [name] 3 N/A Wound Status: Open Open N/A Measurements L x W x D 3x1.2x0.1 0.2x0.5x0.1 N/A (cm) Area (cm) : 2.827 0.079 N/A Volume (cm) : 0.283 0.008 N/A % Reduction in Area: -50.00% 44.00% N/A % Reduction in Volume: 49.90% 42.90% N/A Classification: Grade 2 Partial Thickness N/A Exudate Amount: Medium None Present N/A Exudate Type: Purulent N/A N/A Exudate Color: yellow, brown, green N/A N/A Wound Margin: Flat and Intact Flat and Intact N/A Granulation Amount: None Present (0%) None Present (0%) N/A Necrotic Amount: Large (67-100%) Large (67-100%) N/A Necrotic Tissue: Adherent Slough Eschar N/A Exposed Structures: Fat Layer (Subcutaneous Fascia: No N/A Tissue) Exposed: Yes Fat Layer (Subcutaneous Fascia: No Tissue) Exposed: No [name] [name]. ([id number]) Tendon: No Tendon: No Muscle: No Muscle: No Joint: No Joint: No Bone: No Bone: No Limited to Skin Breakdown Epithelialization: None None N/A Periwound Skin Texture: Excoriation: Yes Excoriation: No N/A Induration: No Induration: No Callus: No Callus: No Crepitus: No Crepitus: No Rash:  No Rash: No Scarring: No Scarring: No Periwound Skin Moisture: Dry/Scaly: Yes Maceration: No N/A Maceration: No Dry/Scaly: No Periwound Skin Color: Erythema: Yes Atrophie Blanche: No N/A Atrophie Blanche: No Cyanosis: No Cyanosis: No Ecchymosis: No Ecchymosis: No Erythema: No Hemosiderin Staining: No Hemosiderin Staining: No Mottled: No Mottled: No Pallor: No Pallor: No Rubor: No Rubor: No Erythema Location: Circumferential N/A N/A Temperature: No Abnormality No Abnormality N/A Tenderness on Palpation: Yes Yes N/A Wound Preparation: Ulcer Cleansing: Ulcer Cleansing: N/A Rinsed/Irrigated with Saline Rinsed/Irrigated with Saline Topical Anesthetic Applied: Topical Anesthetic Applied: Other: lidocaine 4% None Treatment Notes Electronic Signature(s) Signed: 07/02/2018 3:00:04 PM By: [name] [name] Entered By: [name] [name] on 07/02/2018 12:57:31 Ghanem, [name] [name] ([id number]) -------------------------------------------------------------------------------- [place] Details Patient Name: [name] [name]. Date of Service: 07/02/2018 12:30 PM Medical Record Number: [id number] Patient Account Number: [id number] Date of Birth/Sex: 09-01-1927 (85 y.o. F) Treating RN: [name] [name] Primary Care Cove Haydon: [name] [name] Other Clinician: Referring Marlen Mollica: [name] [name] Treating Fateh Kindle/Extender: [name], [name] [name] Active Inactive Abuse / Safety / Falls / Self Care Management Nursing Diagnoses: Potential for falls Goals: Patient will not experience any injury related to falls Date Initiated: 03/23/2018 Target Resolution Date: 06/09/2018 Goal Status: Active Interventions: Assess fall risk on admission and as needed Notes: Orientation to the Wound Care Program Nursing Diagnoses: Knowledge deficit related to the wound healing center program Goals: Patient/caregiver will verbalize understanding of the [place] Program Date Initiated: 03/23/2018 Target Resolution Date: 06/09/2018 Goal Status: Active Interventions: Provide education on orientation to the wound center Notes: Pain, Acute or Chronic Nursing Diagnoses: Pain, acute or chronic: actual or potential Goals: Patient will verbalize adequate pain control and receive pain control interventions during procedures as needed Date Initiated: 03/23/2018 Target Resolution Date: 06/09/2018 Goal Status: Active Interventions: Assess comfort goal upon admission Ringler, Reality [name]. ([id number]) Notes: Wound/Skin Impairment Nursing Diagnoses: Impaired tissue integrity Goals: Ulcer/skin breakdown will heal within [name] weeks Date Initiated: 03/23/2018 Target Resolution Date: 06/09/2018 Goal Status: Active Interventions: Assess patient/caregiver ability to obtain necessary supplies Assess patient/caregiver ability to perform ulcer/skin care regimen upon admission and as needed Assess ulceration(s) every visit Notes: Electronic Signature(s) Signed: 07/02/2018 3:00:04 PM By: [name] [name] Entered By: [name] [name] on 07/02/2018 12:57:23 Luzier, Bathsheba [name]. ([id number]) -------------------------------------------------------------------------------- Pain Assessment Details Patient Name: [name] [name]. Date of Service: 07/02/2018 12:30 PM Medical Record Number: [id number] Patient Account Number: [id number] Date of Birth/Sex: 1928-02-20 (85 y.o. F) Treating  RN: [name] [name] Primary Care Bryce Kimble: [name] [name] Other Clinician: Referring Merridy Pascoe: [name] [name] Treating Nahomy Limburg/Extender: [name], [name] [name] Active Problems Location of Pain Severity and Description of Pain Patient Has [name] No Site Locations Pain Management and Medication Current Pain Management: Electronic Signature(s) Signed: 07/02/2018 3:00:04 PM By: [name] [name] Entered By: [name] [name] on 07/02/2018 12:44:06 Passmore, [name] [name]  ([id number]) -------------------------------------------------------------------------------- Patient/Caregiver Education Details Patient Name: [name] [name]. Date of Service: 07/02/2018 12:30 PM Medical Record Number: [id number] Patient Account Number: [id number] Date of Birth/Gender: 1927/06/04 (85 y.o. F) Treating RN: [name] [name] Primary Care Physician: [name] [name] Other Clinician: Referring Physician: Ria [name] Treating Physician/Extender: Sharalyn Ink in Treatment: [name] Education Assessment Education Provided To: Patient Education Topics Provided Wound/Skin Impairment: Handouts: Caring for Your Ulcer Methods: Demonstration, Explain/Verbal Responses: State content correctly Electronic Signature(s) Signed: 07/02/2018 3:00:04 PM By: [name] [name] Entered By: [name] [name] on 07/02/2018 12:57:48 Nomura, Cyriah Loletha [name] ([id number]) -------------------------------------------------------------------------------- Wound Assessment Details Patient Name: [name] [name]. Date of Service: 07/02/2018 12:30 PM Medical Record Number: [id number] Patient Account Number: [id number] Date of Birth/Sex: May 26, 1927 (85 y.o. F) Treating RN: [name] [name] Primary Care Osten Janek: [name] [name] Other Clinician: Referring Cayli Escajeda: [name] [name] Treating Datra Clary/Extender: STONE III, [name] [name] Wound Status Wound Number: 8 Primary Diabetic Wound/Ulcer of the Lower Extremity Etiology: Wound Location: Right Lower Leg - Lateral Wound Open Wounding Event: Trauma Status: Date Acquired: 03/09/2018 Comorbid Cataracts, Anemia, Arrhythmia, Coronary Artery Weeks Of Treatment: [name] History: Disease, Hypertension, Myocardial Infarction, Clustered Wound: No Type II Diabetes, Osteoarthritis Photos Wound Measurements Length: (cm) 3 % Reduction in Width: (cm) 1.2 % Reduction in Depth: (cm) 0.1 Epithelializat Area: (cm) 2.827 Tunneling: Volume: (cm)  0.283 Undermining: Area: -50% Volume: 49.9% ion: None No No Wound Description Classification: Grade 2 Foul Odor Aft Wound Margin: Flat and Intact Slough/Fibrin Exudate Amount: Medium Exudate Type: Purulent Exudate Color: yellow, brown, green er Cleansing: No o Yes Wound Bed Granulation Amount: None Present (0%) Exposed Structure Necrotic Amount: Large (67-100%) Fascia Exposed: No Necrotic Quality: Adherent Slough Fat Layer (Subcutaneous Tissue) Exposed: Yes Tendon Exposed: No Muscle Exposed: No Joint Exposed: No Bone Exposed: No Periwound Skin Texture Texture Color Ahonen, Charisma [name]. ([id number]) No Abnormalities Noted: No No Abnormalities Noted: No Callus: No Atrophie Blanche: No Crepitus: No Cyanosis: No Excoriation: Yes Ecchymosis: No Induration: No Erythema: Yes Rash: No Erythema Location: Circumferential Scarring: No Hemosiderin Staining: No Mottled: No Moisture Pallor: No No Abnormalities Noted: No Rubor: No Dry / Scaly: Yes Maceration: No Temperature / Pain Temperature: No Abnormality Tenderness on Palpation: Yes Wound Preparation Ulcer Cleansing: Rinsed/Irrigated with Saline Topical Anesthetic Applied: Other: lidocaine 4%, Treatment Notes Wound #8 (Right, Lateral Lower Leg) Notes TCA, BFD, tubigrip Electronic Signature(s) Signed: 07/02/2018 3:00:04 PM By: [name] [name] Entered By: [name] [name] on 07/02/2018 12:48:59 Demarest, Kareemah [name]. ([id number]) -------------------------------------------------------------------------------- Wound Assessment Details Patient Name: Dalpe, Adamaris [name]. Date of Service: 07/02/2018 12:30 PM Medical Record Number: [id number] Patient Account Number: [id number] Date of Birth/Sex: 1927/07/27 (85 y.o. F) Treating RN: [name] [name] Primary Care Malyn Aytes: [name] [name] Other Clinician: Referring Natika Geyer: [name] [name] Treating Elanora Quin/Extender: STONE III, [name] [name] Wound Status Wound  Number: 9 Primary Trauma, Other Etiology: Wound Location: Right Foot - Dorsal Wound Open Wounding Event: Trauma Status: Date Acquired: 06/10/2018 Comorbid Cataracts, Anemia, Arrhythmia, Coronary Artery Weeks Of Treatment: 3 History: Disease, Hypertension, Myocardial Infarction, Clustered Wound: No Type II Diabetes, Osteoarthritis Photos Wound Measurements Length: (cm) 0.2 Width: (cm) 0.5 Depth: (  cm) 0.1 Area: (cm) 0.079 Volume: (cm) 0.008 % Reduction in Area: 44% % Reduction in Volume: 42.9% Epithelialization: None Tunneling: No Undermining: No Wound Description Classification: Partial Thickness Foul Odo Wound Margin: Flat and Intact Slough/F Exudate Amount: None Present r After Cleansing: No ibrino No Wound Bed Granulation Amount: None Present (0%) Exposed Structure Necrotic Amount: Large (67-100%) Fascia Exposed: No Necrotic Quality: Eschar Fat Layer (Subcutaneous Tissue) Exposed: No Tendon Exposed: No Muscle Exposed: No Joint Exposed: No Bone Exposed: No Limited to Skin Breakdown Periwound Skin Texture Texture Color No Abnormalities Noted: No No Abnormalities Noted: No Boursiquot, Launa [name]. ([id number]) Callus: No Atrophie Blanche: No Crepitus: No Cyanosis: No Excoriation: No Ecchymosis: No Induration: No Erythema: No Rash: No Hemosiderin Staining: No Scarring: No Mottled: No Pallor: No Moisture Rubor: No No Abnormalities Noted: No Dry / Scaly: No Temperature / Pain Maceration: No Temperature: No Abnormality Tenderness on Palpation: Yes Wound Preparation Ulcer Cleansing: Rinsed/Irrigated with Saline Topical Anesthetic Applied: None Treatment Notes Wound #9 (Right, Dorsal Foot) Notes TCA, BFD, tubigrip Electronic Signature(s) Signed: 07/02/2018 3:00:04 PM By: [name] [name] Entered By: [name] [name] on 07/02/2018 12:49:25 Eiland, Francely Loletha [name]  ([id number]) -------------------------------------------------------------------------------- Vitals Details Patient Name: Mousel, Zehra [name]. Date of Service: 07/02/2018 12:30 PM Medical Record Number: [id number] Patient Account Number: [id number] Date of Birth/Sex: 1927-11-20 (85 y.o. F) Treating RN: [name] [name] Primary Care Adalina Dopson: [name] [name] Other Clinician: Referring Kalilah Barua: [name] [name] Treating Makiya Jeune/Extender: STONE III, [name] [name] Vital Signs Time Taken: 12:44 Temperature (F): 97.9 Height (in): 58 Pulse (bpm): 71 Weight (lbs): 140 Respiratory Rate (breaths/min): 18 Body Mass Index (BMI): 29.3 Blood Pressure (mmHg): 110/68 Reference Range: 80 - 120 mg / dl Electronic Signature(s) Signed: 07/02/2018 3:00:04 PM By: [name] [name] Entered By: [name] [name] on 07/02/2018 12:44:26

## 2018-07-09 ENCOUNTER — Ambulatory Visit: Payer: Medicare Other | Admitting: Family Medicine

## 2018-07-10 ENCOUNTER — Other Ambulatory Visit: Payer: Self-pay

## 2018-07-10 ENCOUNTER — Encounter: Payer: Medicare Other | Attending: Physician Assistant | Admitting: Physician Assistant

## 2018-07-10 ENCOUNTER — Encounter (INDEPENDENT_AMBULATORY_CARE_PROVIDER_SITE_OTHER): Payer: Medicare Other | Admitting: Vascular Surgery

## 2018-07-10 DIAGNOSIS — I252 Old myocardial infarction: Secondary | ICD-10-CM | POA: Diagnosis not present

## 2018-07-10 DIAGNOSIS — Z87891 Personal history of nicotine dependence: Secondary | ICD-10-CM | POA: Diagnosis not present

## 2018-07-10 DIAGNOSIS — D649 Anemia, unspecified: Secondary | ICD-10-CM | POA: Diagnosis not present

## 2018-07-10 DIAGNOSIS — L03115 Cellulitis of right lower limb: Secondary | ICD-10-CM | POA: Insufficient documentation

## 2018-07-10 DIAGNOSIS — I2510 Atherosclerotic heart disease of native coronary artery without angina pectoris: Secondary | ICD-10-CM | POA: Insufficient documentation

## 2018-07-10 DIAGNOSIS — E11622 Type 2 diabetes mellitus with other skin ulcer: Secondary | ICD-10-CM | POA: Insufficient documentation

## 2018-07-10 DIAGNOSIS — F419 Anxiety disorder, unspecified: Secondary | ICD-10-CM | POA: Diagnosis not present

## 2018-07-10 DIAGNOSIS — M1990 Unspecified osteoarthritis, unspecified site: Secondary | ICD-10-CM | POA: Insufficient documentation

## 2018-07-10 DIAGNOSIS — Z8249 Family history of ischemic heart disease and other diseases of the circulatory system: Secondary | ICD-10-CM | POA: Insufficient documentation

## 2018-07-10 DIAGNOSIS — Z833 Family history of diabetes mellitus: Secondary | ICD-10-CM | POA: Diagnosis not present

## 2018-07-10 DIAGNOSIS — Z90710 Acquired absence of both cervix and uterus: Secondary | ICD-10-CM | POA: Insufficient documentation

## 2018-07-10 DIAGNOSIS — L97511 Non-pressure chronic ulcer of other part of right foot limited to breakdown of skin: Secondary | ICD-10-CM | POA: Diagnosis not present

## 2018-07-10 DIAGNOSIS — L97812 Non-pressure chronic ulcer of other part of right lower leg with fat layer exposed: Secondary | ICD-10-CM | POA: Insufficient documentation

## 2018-07-10 DIAGNOSIS — I10 Essential (primary) hypertension: Secondary | ICD-10-CM | POA: Diagnosis not present

## 2018-07-10 NOTE — Progress Notes (Signed)
[name] ([id number]) Visit Report for 07/10/2018 Arrival Information Details Patient Name: [name], [name] Date of Service: 07/10/2018 1:15 PM Medical Record Number: [id number] Patient Account Number: [id number] Date of Birth/Sex: [birth year] (85 y.o. F) Treating [name]: [name] Primary Care [name]: [name] Other Clinician: Referring [name]: [name] Treating [name]/Extender: [name], [name] Weeks in Treatment: 15 Visit Information History Since Last Visit Added or deleted any medications: No Patient Arrived: Walker Any new allergies or adverse reactions: No Arrival Time: 13:22 Had a fall or experienced change in No Accompanied By: self activities of daily living that may affect Transfer Assistance: None risk of falls: Patient Identification Verified: Yes Signs or symptoms of abuse/neglect since last visito No Secondary Verification Process Completed: Yes Hospitalized since last visit: No Implantable device outside of the clinic excluding No cellular tissue based products placed in the center since last visit: Has Dressing in Place as Prescribed: Yes Pain Present Now: No Electronic Signature(s) Signed: 07/10/2018 4:41:32 PM By: [name], BSN, [name], CWS, [name], BSN Entered By: [name], BSN, [name], CWS, [name] on 07/10/2018 13:22:46 Rewis, [name] ([id number]) -------------------------------------------------------------------------------- Clinic Level of Care Assessment Details Patient Name: [name] Date of Service: 07/10/2018 1:15 PM Medical Record Number: [id number] Patient Account Number: [id number] Date of Birth/Sex: 1927/06/21 (85 y.o. F) Treating [name]: [name] Primary Care Alcario Tinkey: [name] Other Clinician: Referring [name]: [name] Treating [name]/Extender: [name], [name] Weeks in Treatment: 15 Clinic Level of Care Assessment Items TOOL 4 Quantity Score []  - Use when only an EandM is performed on FOLLOW-UP  visit 0 ASSESSMENTS - Nursing Assessment / Reassessment X - Reassessment of Co-morbidities (includes updates in patient status) 1 10 X- 1 5 Reassessment of Adherence to Treatment Plan ASSESSMENTS - Wound and Skin Assessment / Reassessment []  - Simple Wound Assessment / Reassessment - one wound 0 X- 2 5 Complex Wound Assessment / Reassessment - multiple wounds []  - 0 Dermatologic / Skin Assessment (not related to wound area) ASSESSMENTS - Focused Assessment []  - Circumferential Edema Measurements - multi extremities 0 []  - 0 Nutritional Assessment / Counseling / Intervention X- 1 5 Lower Extremity Assessment (monofilament, tuning fork, pulses) []  - 0 Peripheral Arterial Disease Assessment (using hand held doppler) ASSESSMENTS - Ostomy and/or Continence Assessment and Care []  - Incontinence Assessment and Management 0 []  - 0 Ostomy Care Assessment and Management (repouching, etc.) PROCESS - Coordination of Care X - Simple Patient / Family Education for ongoing care 1 15 []  - 0 Complex (extensive) Patient / Family Education for ongoing care X- 1 10 Staff obtains Programmer, systems, Records, Test Results / Process Orders []  - 0 Staff telephones HHA, Nursing Homes / Clarify orders / etc []  - 0 Routine Transfer to another Facility (non-emergent condition) []  - 0 Routine Hospital Admission (non-emergent condition) []  - 0 New Admissions / Biomedical engineer / Ordering NPWT, Apligraf, etc. []  - 0 Emergency Hospital Admission (emergent condition) X- 1 10 Simple Discharge Coordination [name] ([id number]) []  - 0 Complex (extensive) Discharge Coordination PROCESS - Special Needs []  - Pediatric / Minor Patient Management 0 []  - 0 Isolation Patient Management []  - 0 Hearing / Language / Visual special needs []  - 0 Assessment of Community assistance (transportation, D/[name], etc.) []  - 0 Additional assistance / Altered mentation []  - 0 Support Surface(s) Assessment  (bed, cushion, seat, etc.) INTERVENTIONS - Wound Cleansing / Measurement []  - Simple Wound Cleansing - one wound 0 X- 2 5 Complex Wound Cleansing - multiple  wounds X- 1 5 Wound Imaging (photographs - any number of wounds) []  - 0 Wound Tracing (instead of photographs) []  - 0 Simple Wound Measurement - one wound X- 2 5 Complex Wound Measurement - multiple wounds INTERVENTIONS - Wound Dressings X - Small Wound Dressing one or multiple wounds 1 10 []  - 0 Medium Wound Dressing one or multiple wounds []  - 0 Large Wound Dressing one or multiple wounds X- 1 5 Application of Medications - topical []  - 0 Application of Medications - injection INTERVENTIONS - Miscellaneous []  - External ear exam 0 []  - 0 Specimen Collection (cultures, biopsies, blood, body fluids, etc.) []  - 0 Specimen(s) / Culture(s) sent or taken to Lab for analysis []  - 0 Patient Transfer (multiple staff / Civil Service fast streamer / Similar devices) []  - 0 Simple Staple / Suture removal (25 or less) []  - 0 Complex Staple / Suture removal (26 or more) []  - 0 Hypo / Hyperglycemic Management (close monitor of Blood Glucose) []  - 0 Ankle / Brachial Index (ABI) - do not check if billed separately X- 1 5 Vital Signs [name] ([id number]) Has the patient been seen at the hospital within the last three years: Yes Total Score: 110 Level Of Care: New/Established - Level 3 Electronic Signature(s) Signed: 07/10/2018 2:58:29 PM By: [name] Entered By: [name] on 07/10/2018 14:14:37 [name] ([id number]) -------------------------------------------------------------------------------- Complex / Palliative Patient Assessment Details Patient Name: [name], [name] Date of Service: 07/10/2018 1:15 PM Medical Record Number: [id number] Patient Account Number: [id number] Date of Birth/Sex: 02-Jul-1927 (85 y.o. F) Treating [name]: [name] Primary Care [name]: [name] Other Clinician: Referring  Murel Shenberger: [name] Treating [name]/Extender: [name], [name] Weeks in Treatment: 15 Palliative Management Criteria Complex Wound Management Criteria Patient has remarkable or complex co-morbidities requiring medications or treatments that extend wound healing times. Examples: o Diabetes mellitus with chronic renal failure or end stage renal disease requiring dialysis o Advanced or poorly controlled rheumatoid arthritis o Diabetes mellitus and end stage chronic obstructive pulmonary disease o Active cancer with current chemo- or radiation therapy DMII, PVD, HTN, CAD, limited caregiver assistance Care Approach Wound Care Plan: Complex Wound Management Electronic Signature(s) Signed: 07/10/2018 2:58:29 PM By: [name] Signed: 07/10/2018 5:10:39 PM By: Worthy Keeler PA-[name] Entered By: [name] on 07/10/2018 13:15:36 [name], [name] ([id number]) -------------------------------------------------------------------------------- Encounter Discharge Information Details Patient Name: [name], [name] Date of Service: 07/10/2018 1:15 PM Medical Record Number: [id number] Patient Account Number: [id number] Date of Birth/Sex: 1927-05-31 (85 y.o. F) Treating [name]: [name] Primary Care Umar Patmon: [name] Other Clinician: Referring Mell Mellott: [name] Treating Jaquanda Wickersham/Extender: [name], [name] Weeks in Treatment: 15 Encounter Discharge Information Items Discharge Condition: Stable Ambulatory Status: Walker Discharge Destination: Home Transportation: Private Auto Accompanied By: self Schedule Follow-up Appointment: Yes Clinical Summary of Care: Electronic Signature(s) Signed: 07/10/2018 4:41:32 PM By: [name], BSN, [name], CWS, [name], BSN Entered By: [name], BSN, [name], CWS, [name] on 07/10/2018 13:58:40 Pflieger, [name] ([id number]) -------------------------------------------------------------------------------- Lower Extremity Assessment Details Patient  Name: [name], [name] Date of Service: 07/10/2018 1:15 PM Medical Record Number: [id number] Patient Account Number: [id number] Date of Birth/Sex: [birth year] (85 y.o. F) Treating [name]: [name] Primary Care Tiare Rohlman: [name] Other Clinician: Referring Darlis Wragg: [name] Treating Shahad Mazurek/Extender: STONE III, [name] Weeks in Treatment: 15 Edema Assessment Assessed: [Left: No] [Right: No] [Left: Edema] [Right: :] Calf Left: Right: Point of Measurement: 30 cm From Medial Instep cm 28 cm Ankle Left: Right: Point of Measurement: 12  cm From Medial Instep cm 19.2 cm Vascular Assessment Pulses: Dorsalis Pedis Palpable: [Right:Yes] Electronic Signature(s) Signed: 07/10/2018 4:41:32 PM By: [name], BSN, [name], CWS, [name], BSN Entered By: [name], BSN, [name], CWS, [name] on 07/10/2018 13:29:25 [name], [name] ([id number]) -------------------------------------------------------------------------------- Multi Wound Chart Details Patient Name: [name], [name] Date of Service: 07/10/2018 1:15 PM Medical Record Number: [id number] Patient Account Number: [id number] Date of Birth/Sex: 06-30-1927 (85 y.o. F) Treating [name]: [name] Primary Care Nikia Mangino: [name] Other Clinician: Referring Carlie Solorzano: [name] Treating Aqil Goetting/Extender: STONE III, [name] Weeks in Treatment: 15 Vital Signs Height(in): 58 Pulse(bpm): 76 Weight(lbs): 140 Blood Pressure(mmHg): 113/55 Body Mass Index(BMI): 29 Temperature(F): 98.2 Respiratory Rate 18 (breaths/min): Photos: [N/A:N/A] Wound Location: Right Lower Leg - Lateral Right Foot - Dorsal N/A Wounding Event: Trauma Trauma N/A Primary Etiology: Diabetic Wound/Ulcer of the Trauma, Other N/A Lower Extremity Comorbid History: Cataracts, Anemia, Cataracts, Anemia, N/A Arrhythmia, Coronary Artery Arrhythmia, Coronary Artery Disease, Hypertension, Disease, Hypertension, Myocardial Infarction, Type II Myocardial Infarction, Type  II Diabetes, Osteoarthritis Diabetes, Osteoarthritis Date Acquired: 03/09/2018 06/10/2018 N/A Weeks of Treatment: 15 4 N/A Wound Status: Open Open N/A Measurements L x W x D 3.2x1.1x0.1 0.2x0.2x0.1 N/A (cm) Area (cm) : 2.765 0.031 N/A Volume (cm) : 0.276 0.003 N/A % Reduction in Area: -46.70% 78.00% N/A % Reduction in Volume: 51.20% 78.60% N/A Classification: Grade 2 Partial Thickness N/A Exudate Amount: Medium None Present N/A Exudate Type: Purulent N/A N/A Exudate Color: yellow, brown, green N/A N/A Wound Margin: Flat and Intact Flat and Intact N/A Granulation Amount: None Present (0%) None Present (0%) N/A Necrotic Amount: Large (67-100%) Large (67-100%) N/A Necrotic Tissue: Adherent Slough Eschar N/A Exposed Structures: Fat Layer (Subcutaneous Fascia: No N/A Tissue) Exposed: Yes Fat Layer (Subcutaneous Fascia: No Tissue) Exposed: No Nazzaro, Josselyne [name]. ([id number]) Tendon: No Tendon: No Muscle: No Muscle: No Joint: No Joint: No Bone: No Bone: No Limited to Skin Breakdown Epithelialization: None None N/A Periwound Skin Texture: Excoriation: Yes Excoriation: No N/A Induration: No Induration: No Callus: No Callus: No Crepitus: No Crepitus: No Rash: No Rash: No Scarring: No Scarring: No Periwound Skin Moisture: Dry/Scaly: Yes Maceration: No N/A Maceration: No Dry/Scaly: No Periwound Skin Color: Erythema: Yes Atrophie Blanche: No N/A Atrophie Blanche: No Cyanosis: No Cyanosis: No Ecchymosis: No Ecchymosis: No Erythema: No Hemosiderin Staining: No Hemosiderin Staining: No Mottled: No Mottled: No Pallor: No Pallor: No Rubor: No Rubor: No Erythema Location: Circumferential N/A N/A Temperature: No Abnormality No Abnormality N/A Tenderness on Palpation: Yes Yes N/A Treatment Notes Electronic Signature(s) Signed: 07/10/2018 2:58:29 PM By: [name] Entered By: [name] on 07/10/2018 13:50:18 [name], [name]  ([id number]) -------------------------------------------------------------------------------- [place] Details Patient Name: Mirza, Julina [name]. Date of Service: 07/10/2018 1:15 PM Medical Record Number: [id number] Patient Account Number: [id number] Date of Birth/Sex: 04/04/1928 (85 y.o. F) Treating [name]: [name] Primary Care Nowell Sites: [name] Other Clinician: Referring Dashay Giesler: [name] Treating Sabin Gibeault/Extender: [name], [name] Weeks in Treatment: 15 Active Inactive Abuse / Safety / Falls / Self Care Management Nursing Diagnoses: Potential for falls Goals: Patient will not experience any injury related to falls Date Initiated: 03/23/2018 Target Resolution Date: 06/09/2018 Goal Status: Active Interventions: Assess fall risk on admission and as needed Notes: Orientation to the Wound Care Program Nursing Diagnoses: Knowledge deficit related to the wound healing center program Goals: Patient/caregiver will verbalize understanding of the [place] Program Date Initiated: 03/23/2018 Target Resolution Date: 06/09/2018 Goal Status: Active Interventions: Provide education on orientation to the wound center  Notes: Pain, Acute or Chronic Nursing Diagnoses: Pain, acute or chronic: actual or potential Goals: Patient will verbalize adequate pain control and receive pain control interventions during procedures as needed Date Initiated: 03/23/2018 Target Resolution Date: 06/09/2018 Goal Status: Active Interventions: Assess comfort goal upon admission [name], [name] ([id number]) Notes: Wound/Skin Impairment Nursing Diagnoses: Impaired tissue integrity Goals: Ulcer/skin breakdown will heal within 14 weeks Date Initiated: 03/23/2018 Target Resolution Date: 06/09/2018 Goal Status: Active Interventions: Assess patient/caregiver ability to obtain necessary supplies Assess patient/caregiver ability to perform ulcer/skin care regimen  upon admission and as needed Assess ulceration(s) every visit Notes: Electronic Signature(s) Signed: 07/10/2018 2:58:29 PM By: [name] Entered By: [name] on 07/10/2018 13:50:06 Deterding, Kayleena [name]. ([id number]) -------------------------------------------------------------------------------- Pain Assessment Details Patient Name: [name], [name] Date of Service: 07/10/2018 1:15 PM Medical Record Number: [id number] Patient Account Number: [id number] Date of Birth/Sex: 08/09/1927 (85 y.o. F) Treating [name]: [name] Primary Care Maeby Vankleeck: [name] Other Clinician: Referring Maalle Starrett: [name] Treating Lydell Moga/Extender: [name], [name] Weeks in Treatment: 15 Active Problems Location of Pain Severity and Description of Pain Patient Has [name] No Site Locations Pain Management and Medication Current Pain Management: Electronic Signature(s) Signed: 07/10/2018 4:41:32 PM By: [name], BSN, [name], CWS, [name], BSN Entered By: [name], BSN, [name], CWS, [name] on 07/10/2018 13:22:52 [name], [name] ([id number]) -------------------------------------------------------------------------------- Patient/Caregiver Education Details Patient Name: Christenberry, Lorianne [name]. Date of Service: 07/10/2018 1:15 PM Medical Record Number: [id number] Patient Account Number: [id number] Date of Birth/Gender: 03-27-1928 (85 y.o. F) Treating [name]: [name] Primary Care Physician: [name] Other Clinician: Referring Physician: Ria [name] Treating Physician/Extender: Sharalyn Ink in Treatment: 15 Education Assessment Education Provided To: Patient Education Topics Provided Wound/Skin Impairment: Handouts: Other: wound care as ordered Methods: Demonstration, Explain/Verbal Responses: State content correctly Electronic Signature(s) Signed: 07/10/2018 2:58:29 PM By: [name] Entered By: [name] on 07/10/2018 14:15:07 [name], [name]  ([id number]) -------------------------------------------------------------------------------- Wound Assessment Details Patient Name: [name], [name] Date of Service: 07/10/2018 1:15 PM Medical Record Number: [id number] Patient Account Number: [id number] Date of Birth/Sex: 11-01-1927 (85 y.o. F) Treating [name]: [name] Primary Care Kaloni Bisaillon: [name] Other Clinician: Referring Radhika Dershem: [name] Treating Jayvion Stefanski/Extender: [name], [name] Weeks in Treatment: 15 Wound Status Wound Number: 8 Primary Diabetic Wound/Ulcer of the Lower Extremity Etiology: Wound Location: Right Lower Leg - Lateral Wound Open Wounding Event: Trauma Status: Date Acquired: 03/09/2018 Comorbid Cataracts, Anemia, Arrhythmia, Coronary Artery Weeks Of Treatment: 15 History: Disease, Hypertension, Myocardial Infarction, Clustered Wound: No Type II Diabetes, Osteoarthritis Photos Wound Measurements Length: (cm) 3.2 % Reduction in Width: (cm) 1.1 % Reduction in Depth: (cm) 0.1 Epithelializat Area: (cm) 2.765 Tunneling: Volume: (cm) 0.276 Undermining: Area: -46.7% Volume: 51.2% ion: None No No Wound Description Classification: Grade 2 Foul Odor Afte Wound Margin: Flat and Intact Slough/Fibrino Exudate Amount: Medium Exudate Type: Purulent Exudate Color: yellow, brown, green r Cleansing: No Yes Wound Bed Granulation Amount: None Present (0%) Exposed Structure Necrotic Amount: Large (67-100%) Fascia Exposed: No Necrotic Quality: Adherent Slough Fat Layer (Subcutaneous Tissue) Exposed: Yes Tendon Exposed: No Muscle Exposed: No Joint Exposed: No Bone Exposed: No Periwound Skin Texture Texture Color Cuffe, Landra [name]. ([id number]) No Abnormalities Noted: No No Abnormalities Noted: No Callus: No Atrophie Blanche: No Crepitus: No Cyanosis: No Excoriation: Yes Ecchymosis: No Induration: No Erythema: Yes Rash: No Erythema Location: Circumferential Scarring:  No Hemosiderin Staining: No Mottled: No Moisture Pallor: No No Abnormalities Noted: No Rubor: No Dry / Scaly: Yes Maceration: No Temperature / Pain Temperature:  No Abnormality Tenderness on Palpation: Yes Treatment Notes Wound #8 (Right, Lateral Lower Leg) Notes neosporin, telfa, conform secured with tape Electronic Signature(s) Signed: 07/10/2018 4:41:32 PM By: [name], BSN, [name], CWS, [name], BSN Entered By: [name], BSN, [name], CWS, [name] on 07/10/2018 13:27:58 Willadsen, [name] ([id number]) -------------------------------------------------------------------------------- Wound Assessment Details Patient Name: Valentino, Shandricka [name]. Date of Service: 07/10/2018 1:15 PM Medical Record Number: [id number] Patient Account Number: [id number] Date of Birth/Sex: 06/26/1927 (85 y.o. F) Treating [name]: [name] Primary Care Skyelyn Scruggs: [name] Other Clinician: Referring Tiwanna Tuch: [name] Treating Crescent Gotham/Extender: [name], [name] Weeks in Treatment: 15 Wound Status Wound Number: 9 Primary Trauma, Other Etiology: Wound Location: Right Foot - Dorsal Wound Open Wounding Event: Trauma Status: Date Acquired: 06/10/2018 Comorbid Cataracts, Anemia, Arrhythmia, Coronary Artery Weeks Of Treatment: 4 History: Disease, Hypertension, Myocardial Infarction, Clustered Wound: No Type II Diabetes, Osteoarthritis Photos Wound Measurements Length: (cm) 0.2 Width: (cm) 0.2 Depth: (cm) 0.1 Area: (cm) 0.031 Volume: (cm) 0.003 % Reduction in Area: 78% % Reduction in Volume: 78.6% Epithelialization: None Tunneling: No Undermining: No Wound Description Classification: Partial Thickness Foul Odor Wound Margin: Flat and Intact Slough/Fi Exudate Amount: None Present After Cleansing: No brino No Wound Bed Granulation Amount: None Present (0%) Exposed Structure Necrotic Amount: Large (67-100%) Fascia Exposed: No Necrotic Quality: Eschar Fat Layer (Subcutaneous Tissue) Exposed: No Tendon  Exposed: No Muscle Exposed: No Joint Exposed: No Bone Exposed: No Limited to Skin Breakdown Periwound Skin Texture Texture Color No Abnormalities Noted: No No Abnormalities Noted: No Eckmann, Nancyjo [name]. ([id number]) Callus: No Atrophie Blanche: No Crepitus: No Cyanosis: No Excoriation: No Ecchymosis: No Induration: No Erythema: No Rash: No Hemosiderin Staining: No Scarring: No Mottled: No Pallor: No Moisture Rubor: No No Abnormalities Noted: No Dry / Scaly: No Temperature / Pain Maceration: No Temperature: No Abnormality Tenderness on Palpation: Yes Treatment Notes Wound #9 (Right, Dorsal Foot) Notes neosporin, telfa, conform secured with tape Electronic Signature(s) Signed: 07/10/2018 4:41:32 PM By: [name], BSN, [name], CWS, [name], BSN Entered By: [name], BSN, [name], CWS, [name] on 07/10/2018 13:28:28 Hallowell, [name] ([id number]) -------------------------------------------------------------------------------- Vitals Details Patient Name: Stenseth, Dotty [name]. Date of Service: 07/10/2018 1:15 PM Medical Record Number: [id number] Patient Account Number: [id number] Date of Birth/Sex: Apr 23, 1927 (85 y.o. F) Treating [name]: [name] Primary Care Doriana Mazurkiewicz: [name] Other Clinician: Referring Dao Memmott: [name] Treating Devlin Mcveigh/Extender: [name], [name] Weeks in Treatment: 15 Vital Signs Time Taken: 13:22 Temperature (F): 98.2 Height (in): 58 Pulse (bpm): 76 Weight (lbs): 140 Respiratory Rate (breaths/min): 18 Body Mass Index (BMI): 29.3 Blood Pressure (mmHg): 113/55 Reference Range: 80 - 120 mg / dl Electronic Signature(s) Signed: 07/10/2018 4:41:32 PM By: [name], BSN, [name], CWS, [name], BSN Entered By: [name], BSN, [name], CWS, [name] on 07/10/2018 13:24:01

## 2018-07-10 NOTE — Progress Notes (Signed)
[name], [name] ([id number]) Visit Report for 07/10/2018 Chief Complaint Document Details Patient Name: [name], [name] Date of Service: 07/10/2018 1:15 PM Medical Record Number: [id number] Patient Account Number: [id number] Date of Birth/Sex: 1927-12-08 (85 y.o. F) Treating RN: Montey Hora Primary Care Provider: Ria Bush Other Clinician: Referring Provider: Ria Bush Treating Provider/Extender: Melburn Hake, HOYT Weeks in Treatment: 15 Information Obtained from: Patient Chief Complaint RLE wounds Electronic Signature(s) Signed: 07/10/2018 5:10:39 PM By: Worthy Keeler PA-C Entered By: Worthy Keeler on 07/10/2018 13:08:29 [name], [name] ([id number]) -------------------------------------------------------------------------------- HPI Details Patient Name: [name], [name] Date of Service: 07/10/2018 1:15 PM Medical Record Number: [id number] Patient Account Number: [id number] Date of Birth/Sex: May 19, 1927 (85 y.o. F) Treating RN: Montey Hora Primary Care Provider: Ria Bush Other Clinician: Referring Provider: Ria Bush Treating Provider/Extender: Melburn Hake, HOYT Weeks in Treatment: 15 History of Present Illness HPI Description: Readmission: 03/23/18 on evaluation today patient presents for readmission entire clinic concerning issues that she has been having for the past couple of weeks in regard to her right lower extremity. The medial injury actually occurred when her dog tried to take a blanket from her and subsequently scratched her with his toenail. Subsequently the lateral injury was a wound where she dropped a knife in her kitchen and hit the side of her leg. With that being said both have been open for some time and home health nurse that has been coming out marked for her where the redness was several days ago. Nonetheless this has spread will be on the margin of what was marked. She was placed on Keflex which was started two days ago but  at this point it does not seem to have helped with any improvement in the overall appearance of the wounds. No fevers, chills, nausea, or vomiting noted at this time. 04/02/18 Seen today for follow up and management of right lower extremity wounds. Right lateral wound with adherent slough. She is complaining of pain to touch which limited exam. Recently treated in the ED for cellulitis and wound culture positive for MRSA. Completed a dose of Vancomycin and transitioned to doxycycline by mouth. Erythema remains present with improvement since last visit. Right medial wound with scab present. No fevers, chills, nausea, or vomiting noted at this time. 04/10/18 on evaluation today patient appears to be doing a [name] in regard to her right lateral lower [name]. The slough does seem to be coming off although it is slow from the wound bed. Fortunately there does not appear to be any evidence of infection at this time which is good news. With that being said I do believe that she is going to require longer treatment with the Santyl simply due to the fact that I'm not able to really debride the wound due to the pain that she is experiencing. 04/16/18 upon evaluation today patient's wound bed currently on the lateral lower extremity on the right appears to be doing a [name] at this point. We are utilizing sensible currently. Overall I'm very pleased with how things are progressing although there still is some work to do before this will be completely healed. No fevers, chills, nausea, or vomiting noted at this time. 04/23/18 on evaluation today patient appears to be doing about the same at this point maybe just a [name] than last week's evaluation. She continues to have some slough buildup on the surface of the wound and has very Mini granulation unfortunately. This seems to be very slow moving  I'm getting somewhat concerned about the possibility of her having decreased blood  flow which is leading to her subsequently not being able to heal this area as quickly as she shut otherwise. She is currently utilizing Santyl. 05/01/18 on evaluation today patient appears to be doing somewhat poorly in regard to her lower extremity ulcer. At this point she's been tolerating the dressing changes to some degree without complication. With that being said she does note of the past several days that she has been having more pain unfortunately. On inspection it appears that she may have signs of infection which would account for the pain. 05/07/18 on evaluation today patient's wound actually appears to be doing better she's not having as much erythema in the right lower extremity as compared to last week. Fortunately there's no signs of infection this is good news as well. Overall very pleased with the progress that she has made up to this point. 05/14/18 on evaluation today patient actually appears to be doing rather well in regard to her right lower extremity ulcer. She does not have erythema that was characterizing the cellulitis prior to initiation of antibiotic therapy. With that being said her wound still appears to be somewhat slow in healing I believe this may be arterial in nature that is my concern at least. With that being said I do want her to see the vascular specialist as soon as possible we been trying to get this set up I'm not sure if she's not answering the phone or what exactly is going on here. [name], [name] ([id number]) 05/21/18 on evaluation today patient's wound actually appears to be doing a Ruta better possibly some additional granulation compared to my last evaluation. With that being said she still continues to have a lot of pain especially when her legs are elevated such as sitting in the chair here in the office. There does not appear to be any signs of infection which is good news. 05/28/18 on evaluation today patient appears to be doing a Cortinas better in  regard to her right lateral lower [name]. Again she was supposed to have an appointment with vascular unfortunately they attempted to call her three times and unfortunately were unable to get anything scheduled. That reason closed up for him. She tells me that if the phone is answered and she doesn't recognize what is that she will hang up as well those that she lives with. Obviously she's not gonna recognize somebody calling from the office that she has not previously spoken with. Nonetheless the patient states that she does not remember any fall. I think if we're gonna get her in with vascular specialist we're gonna have to try to make the appointment for her. 06/04/2018 Seen today for follow up and management of right lateral lower extremity wound. Adherent slough present over surface of wound. Scheduled to follow up for ABI/TBI on 06/06/2018 with vein and vascular specialist. She is hard of hearing. Wound is sensitive to touch. No signs or symptoms of infection. Denies any recent fevers, chills, or SOB. 06/11/18 on evaluation today patient actually appears to be doing very well in regard to her right lateral lower extremity ulcer. She's been tolerating the dressing changes without complication. Fortunately there's no evidence of infection. With that being said she did have her arterial studies which revealed that she had a very low ABI which was 0.67 on the right with a TBI of 0.32 and 0.65 on the left with a TBI of 0.16. I  do believe she needs to see vascular and we are working on that referral as well. 06/21/18 on evaluation today patient's wound actually appears to be doing okay she does have an appointment with vascular coming up on the 24th. Nonetheless she's been having some issues that are more urinary in nature right now that are causing trouble for her. Fortunately there's no signs of severe sepsis although she is running a low-grade temperature 99.9 on evaluation today. She also is  having some issues right now with chills as well as urinary urgency and frequency. Again I feel like the city is more urinary and symptomology I do not believe she has anything upper respiratory which is good news. Nonetheless I think this may need to be checked out more quickly especially in light of the fact that she's having significant back pain at this point. 07/02/18 on evaluation today patient appears to be doing okay in regard to her ulcer on the right lower extremity. She's been tolerating the dressing changes without complication. Fortunately there does not appear to be any signs of infection which is good news. No fevers, chills, nausea, or vomiting noted at this time. 07/10/18 on evaluation today patient appears to be doing well in regard to her right lateral lower extremity ulcer all things considering. She's been using this form this seems to be the one thing that she can really do on her own and she seems to be making some progress obviously help with this matter. I think the biggest issue limiting her feeling speed is her blood flow she canceled her original appointment with vascular her next one is not till May. She was worried about the Covid-19 Virus and having to see another physician. Nonetheless there's no signs of active infection at this time. Electronic Signature(s) Signed: 07/10/2018 5:10:39 PM By: Worthy Keeler PA-C Entered By: Worthy Keeler on 07/10/2018 14:09:56 Bollen, [name] ([id number]) -------------------------------------------------------------------------------- Physical Exam Details Patient Name: [name], [name] Date of Service: 07/10/2018 1:15 PM Medical Record Number: [id number] Patient Account Number: [id number] Date of Birth/Sex: July 07, 1927 (85 y.o. F) Treating RN: Montey Hora Primary Care Provider: Ria Bush Other Clinician: Referring Provider: Ria Bush Treating Provider/Extender: STONE III, HOYT Weeks in Treatment:  74 Constitutional Well-nourished and well-hydrated in no acute distress. Respiratory normal breathing without difficulty. clear to auscultation bilaterally. Cardiovascular regular rate and rhythm with normal S1, S2. Psychiatric this patient is able to make decisions and demonstrates good insight into disease process. Alert and Oriented x 3. pleasant and cooperative. Notes Upon evaluation today patient's wound bed did have some minimal Slough noted on the surface of the wound. Fortunately there does not appear to be any sign of active infection at this time. She has been tolerating the dressing changes without complication overall been very pleased with how things seem to be going at this point no sharp debridement was performed. Obviously I think Santyl would be better for her although she was not able to get this unfortunately. Therefore we have been basically just using Neosporin at the patient request since this is something she can do on her own. Electronic Signature(s) Signed: 07/10/2018 5:10:39 PM By: Worthy Keeler PA-C Entered By: Worthy Keeler on 07/10/2018 14:10:51 Brazzel, [name] ([id number]) -------------------------------------------------------------------------------- Physician Orders Details Patient Name: Mchan, Shaolin C. Date of Service: 07/10/2018 1:15 PM Medical Record Number: [id number] Patient Account Number: [id number] Date of Birth/Sex: May 04, 1927 (85 y.o. F) Treating RN: Montey Hora Primary Care Provider: Ria Bush Other Clinician: Referring Provider:  Ria Bush Treating Provider/Extender: STONE III, HOYT Weeks in Treatment: 15 Verbal / Phone Orders: No Diagnosis Coding ICD-10 Coding Code Description I87.331 Chronic venous hypertension (idiopathic) with ulcer and inflammation of right lower extremity E11.622 Type 2 diabetes mellitus with other skin ulcer L97.812 Non-pressure chronic ulcer of other part of right lower leg with fat layer  exposed L03.115 Cellulitis of right lower limb Wound Cleansing Wound #8 Right,Lateral Lower Leg o Clean wound with Normal Saline. Wound #9 Right,Dorsal Foot o Clean wound with Normal Saline. Anesthetic (add to Medication List) Wound #8 Right,Lateral Lower Leg o Topical Lidocaine 4% cream applied to wound bed prior to debridement (In Clinic Only). Wound #9 Right,Dorsal Foot o Topical Lidocaine 4% cream applied to wound bed prior to debridement (In Clinic Only). Primary Wound Dressing Wound #8 Right,Lateral Lower Leg o Other: - Antibiotic ointment Wound #9 Right,Dorsal Foot o Other: - Antibiotic ointment Secondary Dressing Wound #8 Right,Lateral Lower Leg o ABD and Kerlix/Conform Dressing Change Frequency Wound #8 Right,Lateral Lower Leg o Change dressing every other day. Wound #9 Right,Dorsal Foot o Change dressing every other day. Follow-up Appointments Wound #8 Right,Lateral Lower Leg o Return Appointment in 1 week. [name], [name] ([id number]) Wound #9 Right,Dorsal Foot o Return Appointment in 1 week. Edema Control Wound #8 Right,Lateral Lower Leg o Elevate legs to the level of the heart and pump ankles as often as possible o Other: - tubigrip Wound #9 Right,Dorsal Foot o Elevate legs to the level of the heart and pump ankles as often as possible o Other: - tubigrip Electronic Signature(s) Signed: 07/10/2018 2:58:29 PM By: Montey Hora Signed: 07/10/2018 5:10:39 PM By: Worthy Keeler PA-C Entered By: Montey Hora on 07/10/2018 13:56:04 [name], [name] ([id number]) -------------------------------------------------------------------------------- Problem List Details Patient Name: [name], [name] Date of Service: 07/10/2018 1:15 PM Medical Record Number: [id number] Patient Account Number: [id number] Date of Birth/Sex: 07-25-1927 (85 y.o. F) Treating RN: Montey Hora Primary Care Provider: Ria Bush Other Clinician: Referring  Provider: Ria Bush Treating Provider/Extender: Melburn Hake, HOYT Weeks in Treatment: 15 Active Problems ICD-10 Evaluated Encounter Code Description Active Date Today Diagnosis I87.331 Chronic venous hypertension (idiopathic) with ulcer and 03/23/2018 No Yes inflammation of right lower extremity E11.622 Type 2 diabetes mellitus with other skin ulcer 03/23/2018 No Yes L97.812 Non-pressure chronic ulcer of other part of right lower leg 03/23/2018 No Yes with fat layer exposed L03.115 Cellulitis of right lower limb 03/23/2018 No Yes Inactive Problems Resolved Problems Electronic Signature(s) Signed: 07/10/2018 5:10:39 PM By: Worthy Keeler PA-C Entered By: Worthy Keeler on 07/10/2018 13:08:24 [name], [name] Loletha Grayer ([id number]) -------------------------------------------------------------------------------- Progress Note Details Patient Name: [name], [name] Date of Service: 07/10/2018 1:15 PM Medical Record Number: [id number] Patient Account Number: [id number] Date of Birth/Sex: Aug 21, 1927 (85 y.o. F) Treating RN: Montey Hora Primary Care Provider: Ria Bush Other Clinician: Referring Provider: Ria Bush Treating Provider/Extender: Melburn Hake, HOYT Weeks in Treatment: 15 Subjective Chief Complaint Information obtained from Patient RLE wounds History of Present Illness (HPI) Readmission: 03/23/18 on evaluation today patient presents for readmission entire clinic concerning issues that she has been having for the past couple of weeks in regard to her right lower extremity. The medial injury actually occurred when her dog tried to take a blanket from her and subsequently scratched her with his toenail. Subsequently the lateral injury was a wound where she dropped a knife in her kitchen and hit the side of her leg. With that being said both have been open for some time and  home health nurse that has been coming out marked for her where the redness was several days  ago. Nonetheless this has spread will be on the margin of what was marked. She was placed on Keflex which was started two days ago but at this point it does not seem to have helped with any improvement in the overall appearance of the wounds. No fevers, chills, nausea, or vomiting noted at this time. 04/02/18 Seen today for follow up and management of right lower extremity wounds. Right lateral wound with adherent slough. She is complaining of pain to touch which limited exam. Recently treated in the ED for cellulitis and wound culture positive for MRSA. Completed a dose of Vancomycin and transitioned to doxycycline by mouth. Erythema remains present with improvement since last visit. Right medial wound with scab present. No fevers, chills, nausea, or vomiting noted at this time. 04/10/18 on evaluation today patient appears to be doing a Cruickshank better in regard to her right lateral lower [name]. The slough does seem to be coming off although it is slow from the wound bed. Fortunately there does not appear to be any evidence of infection at this time which is good news. With that being said I do believe that she is going to require longer treatment with the Santyl simply due to the fact that I'm not able to really debride the wound due to the pain that she is experiencing. 04/16/18 upon evaluation today patient's wound bed currently on the lateral lower extremity on the right appears to be doing a Spink better at this point. We are utilizing sensible currently. Overall I'm very pleased with how things are progressing although there still is some work to do before this will be completely healed. No fevers, chills, nausea, or vomiting noted at this time. 04/23/18 on evaluation today patient appears to be doing about the same at this point maybe just a Petties bit smaller than last week's evaluation. She continues to have some slough buildup on the surface of the wound and has very Mian  granulation unfortunately. This seems to be very slow moving I'm getting somewhat concerned about the possibility of her having decreased blood flow which is leading to her subsequently not being able to heal this area as quickly as she shut otherwise. She is currently utilizing Santyl. 05/01/18 on evaluation today patient appears to be doing somewhat poorly in regard to her lower extremity ulcer. At this point she's been tolerating the dressing changes to some degree without complication. With that being said she does note of the past several days that she has been having more pain unfortunately. On inspection it appears that she may have signs of infection which would account for the pain. 05/07/18 on evaluation today patient's wound actually appears to be doing better she's not having as much erythema in the right lower extremity as compared to last week. Fortunately there's no signs of infection this is good news as well. Overall very pleased with the progress that she has made up to this point. [name], [name] ([id number]) 05/14/18 on evaluation today patient actually appears to be doing rather well in regard to her right lower extremity ulcer. She does not have erythema that was characterizing the cellulitis prior to initiation of antibiotic therapy. With that being said her wound still appears to be somewhat slow in healing I believe this may be arterial in nature that is my concern at least. With that being said I do want her to see  the vascular specialist as soon as possible we been trying to get this set up I'm not sure if she's not answering the phone or what exactly is going on here. 05/21/18 on evaluation today patient's wound actually appears to be doing a Duca better possibly some additional granulation compared to my last evaluation. With that being said she still continues to have a lot of pain especially when her legs are elevated such as sitting in the chair here in the office.  There does not appear to be any signs of infection which is good news. 05/28/18 on evaluation today patient appears to be doing a Kyser better in regard to her right lateral lower [name]. Again she was supposed to have an appointment with vascular unfortunately they attempted to call her three times and unfortunately were unable to get anything scheduled. That reason closed up for him. She tells me that if the phone is answered and she doesn't recognize what is that she will hang up as well those that she lives with. Obviously she's not gonna recognize somebody calling from the office that she has not previously spoken with. Nonetheless the patient states that she does not remember any fall. I think if we're gonna get her in with vascular specialist we're gonna have to try to make the appointment for her. 06/04/2018 Seen today for follow up and management of right lateral lower extremity wound. Adherent slough present over surface of wound. Scheduled to follow up for ABI/TBI on 06/06/2018 with vein and vascular specialist. She is hard of hearing. Wound is sensitive to touch. No signs or symptoms of infection. Denies any recent fevers, chills, or SOB. 06/11/18 on evaluation today patient actually appears to be doing very well in regard to her right lateral lower extremity ulcer. She's been tolerating the dressing changes without complication. Fortunately there's no evidence of infection. With that being said she did have her arterial studies which revealed that she had a very low ABI which was 0.67 on the right with a TBI of 0.32 and 0.65 on the left with a TBI of 0.16. I do believe she needs to see vascular and we are working on that referral as well. 06/21/18 on evaluation today patient's wound actually appears to be doing okay she does have an appointment with vascular coming up on the 24th. Nonetheless she's been having some issues that are more urinary in nature right now that are  causing trouble for her. Fortunately there's no signs of severe sepsis although she is running a low-grade temperature 99.9 on evaluation today. She also is having some issues right now with chills as well as urinary urgency and frequency. Again I feel like the city is more urinary and symptomology I do not believe she has anything upper respiratory which is good news. Nonetheless I think this may need to be checked out more quickly especially in light of the fact that she's having significant back pain at this point. 07/02/18 on evaluation today patient appears to be doing okay in regard to her ulcer on the right lower extremity. She's been tolerating the dressing changes without complication. Fortunately there does not appear to be any signs of infection which is good news. No fevers, chills, nausea, or vomiting noted at this time. 07/10/18 on evaluation today patient appears to be doing well in regard to her right lateral lower extremity ulcer all things considering. She's been using this form this seems to be the one thing that she can really do  on her own and she seems to be making some progress obviously help with this matter. I think the biggest issue limiting her feeling speed is her blood flow she canceled her original appointment with vascular her next one is not till May. She was worried about the Covid-19 Virus and having to see another physician. Nonetheless there's no signs of active infection at this time. Patient History Information obtained from Patient. Family History Diabetes - Father,Siblings,Child, Hypertension - Child, Lung Disease - Child, Stroke - Mother, No family history of Cancer, Heart Disease, Kidney Disease, Seizures, Thyroid Problems. Social History Former smoker, Marital Status - Single, Alcohol Use - Never, Drug Use - No History, Caffeine Use - Daily. Medical History Eyes Swiderski, Catlynn C. ([id number]) Patient has history of Cataracts - removed Denies history of  Glaucoma, Optic Neuritis Hematologic/Lymphatic Patient has history of Anemia Denies history of Hemophilia, Human Immunodeficiency Virus, Lymphedema, Sickle Cell Disease Respiratory Denies history of Aspiration, Asthma, Chronic Obstructive Pulmonary Disease (COPD), Pneumothorax, Sleep Apnea, Tuberculosis Cardiovascular Patient has history of Arrhythmia, Coronary Artery Disease, Hypertension, Myocardial Infarction Denies history of Angina, Congestive Heart Failure, Deep Vein Thrombosis, Hypotension, Peripheral Arterial Disease, Peripheral Venous Disease, Phlebitis Endocrine Patient has history of Type II Diabetes Denies history of Type I Diabetes Genitourinary Denies history of End Stage Renal Disease Immunological Denies history of Lupus Erythematosus, Raynaud s, Scleroderma Integumentary (Skin) Denies history of History of Burn, History of pressure wounds Musculoskeletal Patient has history of Osteoarthritis Denies history of Gout, Rheumatoid Arthritis, Osteomyelitis Neurologic Denies history of Dementia, Neuropathy, Quadriplegia, Paraplegia, Seizure Disorder Oncologic Denies history of Received Chemotherapy, Received Radiation Medical And Surgical History Notes Constitutional Symptoms (General Health) High Blood Pressure, open heart surgery, gall stone, hysterectomy; Fluid pill, Anxiety Review of Systems (ROS) Constitutional Symptoms (General Health) Denies complaints or symptoms of Fatigue, Fever, Chills, Marked Weight Change. Respiratory Denies complaints or symptoms of Chronic or frequent coughs, Shortness of Breath. Cardiovascular Complains or has symptoms of LE edema. Denies complaints or symptoms of Chest pain. Psychiatric Denies complaints or symptoms of Anxiety, Claustrophobia. Objective Constitutional Well-nourished and well-hydrated in no acute distress. Vitals Time Taken: 1:22 PM, Height: 58 in, Weight: 140 lbs, BMI: 29.3, Temperature: 98.2 F, Pulse: 76 bpm,  Respiratory Rate: 18 breaths/min, Blood Pressure: 113/55 mmHg. [name], [name] ([id number]) Respiratory normal breathing without difficulty. clear to auscultation bilaterally. Cardiovascular regular rate and rhythm with normal S1, S2. Psychiatric this patient is able to make decisions and demonstrates good insight into disease process. Alert and Oriented x 3. pleasant and cooperative. General Notes: Upon evaluation today patient's wound bed did have some minimal Slough noted on the surface of the wound. Fortunately there does not appear to be any sign of active infection at this time. She has been tolerating the dressing changes without complication overall been very pleased with how things seem to be going at this point no sharp debridement was performed. Obviously I think Santyl would be better for her although she was not able to get this unfortunately. Therefore we have been basically just using Neosporin at the patient request since this is something she can do on her own. Integumentary (Hair, Skin) Wound #8 status is Open. Original cause of wound was Trauma. The wound is located on the Right,Lateral Lower Leg. The wound measures 3.2cm length x 1.1cm width x 0.1cm depth; 2.765cm^2 area and 0.276cm^3 volume. There is Fat Layer (Subcutaneous Tissue) Exposed exposed. There is no tunneling or undermining noted. There is a medium amount of purulent drainage  noted. The wound margin is flat and intact. There is no granulation within the wound bed. There is a large (67-100%) amount of necrotic tissue within the wound bed including Adherent Slough. The periwound skin appearance exhibited: Excoriation, Dry/Scaly, Erythema. The periwound skin appearance did not exhibit: Callus, Crepitus, Induration, Rash, Scarring, Maceration, Atrophie Blanche, Cyanosis, Ecchymosis, Hemosiderin Staining, Mottled, Pallor, Rubor. The surrounding wound skin color is noted with erythema which is circumferential.  Periwound temperature was noted as No Abnormality. The periwound has tenderness on palpation. Wound #9 status is Open. Original cause of wound was Trauma. The wound is located on the Right,Dorsal Foot. The wound measures 0.2cm length x 0.2cm width x 0.1cm depth; 0.031cm^2 area and 0.003cm^3 volume. The wound is limited to skin breakdown. There is no tunneling or undermining noted. There is a none present amount of drainage noted. The wound margin is flat and intact. There is no granulation within the wound bed. There is a large (67-100%) amount of necrotic tissue within the wound bed including Eschar. The periwound skin appearance did not exhibit: Callus, Crepitus, Excoriation, Induration, Rash, Scarring, Dry/Scaly, Maceration, Atrophie Blanche, Cyanosis, Ecchymosis, Hemosiderin Staining, Mottled, Pallor, Rubor, Erythema. Periwound temperature was noted as No Abnormality. The periwound has tenderness on palpation. Assessment Active Problems ICD-10 Chronic venous hypertension (idiopathic) with ulcer and inflammation of right lower extremity Type 2 diabetes mellitus with other skin ulcer Non-pressure chronic ulcer of other part of right lower leg with fat layer exposed Cellulitis of right lower limb Plan [name], [name] ([id number]) Wound Cleansing: Wound #8 Right,Lateral Lower Leg: Clean wound with Normal Saline. Wound #9 Right,Dorsal Foot: Clean wound with Normal Saline. Anesthetic (add to Medication List): Wound #8 Right,Lateral Lower Leg: Topical Lidocaine 4% cream applied to wound bed prior to debridement (In Clinic Only). Wound #9 Right,Dorsal Foot: Topical Lidocaine 4% cream applied to wound bed prior to debridement (In Clinic Only). Primary Wound Dressing: Wound #8 Right,Lateral Lower Leg: Other: - Antibiotic ointment Wound #9 Right,Dorsal Foot: Other: - Antibiotic ointment Secondary Dressing: Wound #8 Right,Lateral Lower Leg: ABD and Kerlix/Conform Dressing Change  Frequency: Wound #8 Right,Lateral Lower Leg: Change dressing every other day. Wound #9 Right,Dorsal Foot: Change dressing every other day. Follow-up Appointments: Wound #8 Right,Lateral Lower Leg: Return Appointment in 1 week. Wound #9 Right,Dorsal Foot: Return Appointment in 1 week. Edema Control: Wound #8 Right,Lateral Lower Leg: Elevate legs to the level of the heart and pump ankles as often as possible Other: - tubigrip Wound #9 Right,Dorsal Foot: Elevate legs to the level of the heart and pump ankles as often as possible Other: - tubigrip My suggestion at this time is gonna be that we go ahead and continue with the above wound care measures for the next week and the patient is in agreement with plan. We will subsequently see were things that at follow-up. If anything changes or worsens she let me know otherwise since she can do the Neosporin on her own things seem to be doing okay with this treatment she will continue with the is born for the time being. The patient is in agreement with plan. Please see above for specific wound care orders. We will see patient for re-evaluation in 1 week(s) here in the clinic. If anything worsens or changes patient will contact our office for additional recommendations. Electronic Signature(s) Signed: 07/10/2018 5:10:39 PM By: Worthy Keeler PA-C Entered By: Worthy Keeler on 07/10/2018 14:11:25 Cryer, [name] ([id number]) -------------------------------------------------------------------------------- ROS/PFSH Details Patient Name: [name], [name] Date of  Service: 07/10/2018 1:15 PM Medical Record Number: [id number] Patient Account Number: [id number] Date of Birth/Sex: 1928-03-10 (85 y.o. F) Treating RN: Montey Hora Primary Care Provider: Ria Bush Other Clinician: Referring Provider: Ria Bush Treating Provider/Extender: STONE III, HOYT Weeks in Treatment: 15 Information Obtained From Patient Constitutional Symptoms  (General Health) Complaints and Symptoms: Negative for: Fatigue; Fever; Chills; Marked Weight Change Medical History: Past Medical History Notes: High Blood Pressure, open heart surgery, gall stone, hysterectomy; Fluid pill, Anxiety Respiratory Complaints and Symptoms: Negative for: Chronic or frequent coughs; Shortness of Breath Medical History: Negative for: Aspiration; Asthma; Chronic Obstructive Pulmonary Disease (COPD); Pneumothorax; Sleep Apnea; Tuberculosis Cardiovascular Complaints and Symptoms: Positive for: LE edema Negative for: Chest pain Medical History: Positive for: Arrhythmia; Coronary Artery Disease; Hypertension; Myocardial Infarction Negative for: Angina; Congestive Heart Failure; Deep Vein Thrombosis; Hypotension; Peripheral Arterial Disease; Peripheral Venous Disease; Phlebitis Psychiatric Complaints and Symptoms: Negative for: Anxiety; Claustrophobia Eyes Medical History: Positive for: Cataracts - removed Negative for: Glaucoma; Optic Neuritis Hematologic/Lymphatic Medical History: Positive for: Anemia Negative for: Hemophilia; Human Immunodeficiency Virus; Lymphedema; Sickle Cell Disease Endocrine [name], [name] ([id number]) Medical History: Positive for: Type II Diabetes Negative for: Type I Diabetes Time with diabetes: no idea Treated with: Oral agents Blood sugar tested every day: No Genitourinary Medical History: Negative for: End Stage Renal Disease Immunological Medical History: Negative for: Lupus Erythematosus; Raynaudos; Scleroderma Integumentary (Skin) Medical History: Negative for: History of Burn; History of pressure wounds Musculoskeletal Medical History: Positive for: Osteoarthritis Negative for: Gout; Rheumatoid Arthritis; Osteomyelitis Neurologic Medical History: Negative for: Dementia; Neuropathy; Quadriplegia; Paraplegia; Seizure Disorder Oncologic Medical History: Negative for: Received Chemotherapy; Received  Radiation HBO Extended History Items Eyes: Cataracts Immunizations Pneumococcal Vaccine: Received Pneumococcal Vaccination: Yes Implantable Devices No devices added Family and Social History Cancer: No; Diabetes: Yes - Father,Siblings,Child; Heart Disease: No; Hypertension: Yes - Child; Kidney Disease: No; Lung Disease: Yes - Child; Seizures: No; Stroke: Yes - Mother; Thyroid Problems: No; Former smoker; Marital Status - Single; Alcohol Use: Never; Drug Use: No History; Caffeine Use: Daily; Financial Concerns: No; Food, Clothing or Shelter Needs: No; Support System Lacking: No; Transportation Concerns: No Physician Affirmation I have reviewed and agree with the above information. SHENAYA, LEBO ([id number]) Electronic Signature(s) Signed: 07/10/2018 2:58:29 PM By: Montey Hora Signed: 07/10/2018 5:10:39 PM By: Worthy Keeler PA-C Entered By: Worthy Keeler on 07/10/2018 14:10:37 Obarr, [name] ([id number]) -------------------------------------------------------------------------------- SuperBill Details Patient Name: Sigal, Kassadi C. Date of Service: 07/10/2018 Medical Record Number: [id number] Patient Account Number: [id number] Date of Birth/Sex: [birth year] (85 y.o. F) Treating RN: Montey Hora Primary Care Provider: Ria Bush Other Clinician: Referring Provider: Ria Bush Treating Provider/Extender: Melburn Hake, HOYT Weeks in Treatment: 15 Diagnosis Coding ICD-10 Codes Code Description I87.331 Chronic venous hypertension (idiopathic) with ulcer and inflammation of right lower extremity E11.622 Type 2 diabetes mellitus with other skin ulcer L97.812 Non-pressure chronic ulcer of other part of right lower leg with fat layer exposed L03.115 Cellulitis of right lower limb Facility Procedures CPT4 Code: [id number] Description: 99213 - WOUND CARE VISIT-LEV 3 EST PT Modifier: Quantity: 1 Physician Procedures CPT4: Description Modifier Quantity Code 2482500  [id number] - WC PHYS LEVEL 4 - EST PT 1 ICD-10 Diagnosis Description I87.331 Chronic venous hypertension (idiopathic) with ulcer and inflammation of right lower extremity E11.622 Type 2 diabetes mellitus with  other skin ulcer L97.812 Non-pressure chronic ulcer of other part of right lower leg with fat layer exposed L03.115 Cellulitis of right lower limb Electronic Signature(s) Signed: 07/10/2018 2:14:47 PM  By: Montey Hora Signed: 07/10/2018 5:10:39 PM By: Worthy Keeler PA-C Entered By: Montey Hora on 07/10/2018 14:14:46

## 2018-07-12 ENCOUNTER — Other Ambulatory Visit: Payer: Self-pay | Admitting: Family Medicine

## 2018-07-16 ENCOUNTER — Other Ambulatory Visit: Payer: Self-pay

## 2018-07-16 ENCOUNTER — Encounter: Payer: Medicare Other | Admitting: Physician Assistant

## 2018-07-16 DIAGNOSIS — L97519 Non-pressure chronic ulcer of other part of right foot with unspecified severity: Secondary | ICD-10-CM | POA: Diagnosis not present

## 2018-07-16 DIAGNOSIS — E11622 Type 2 diabetes mellitus with other skin ulcer: Secondary | ICD-10-CM | POA: Diagnosis not present

## 2018-07-16 DIAGNOSIS — I252 Old myocardial infarction: Secondary | ICD-10-CM | POA: Diagnosis not present

## 2018-07-16 DIAGNOSIS — L03115 Cellulitis of right lower limb: Secondary | ICD-10-CM | POA: Diagnosis not present

## 2018-07-16 DIAGNOSIS — I2510 Atherosclerotic heart disease of native coronary artery without angina pectoris: Secondary | ICD-10-CM | POA: Diagnosis not present

## 2018-07-16 DIAGNOSIS — L97812 Non-pressure chronic ulcer of other part of right lower leg with fat layer exposed: Secondary | ICD-10-CM | POA: Diagnosis not present

## 2018-07-16 DIAGNOSIS — D649 Anemia, unspecified: Secondary | ICD-10-CM | POA: Diagnosis not present

## 2018-07-17 NOTE — Progress Notes (Signed)
[name], [name] ([id number]) Visit Report for 07/16/2018 Arrival Information Details Patient Name: [name], [name] Date of Service: 07/16/2018 12:45 PM Medical Record Number: [id number] Patient Account Number: [id number] Date of Birth/Sex: [birth year] (85 y.o. F) Treating RN: [name] Primary Care [name]: [name] Other Clinician: Referring [name]: [name] Treating [name]/Extender: [name], [name] in Treatment: 16 Visit Information History Since Last Visit Added or deleted any medications: No Patient Arrived: [name] Any new allergies or adverse reactions: No Arrival Time: 12:53 Had a fall or experienced change in No Accompanied By: self activities of daily living that may affect Transfer Assistance: None risk of falls: Patient Identification Verified: Yes Signs or symptoms of abuse/neglect since last visito No Secondary Verification Process Completed: Yes Hospitalized since last visit: No Implantable device outside of the clinic excluding No cellular tissue based products placed in the center since last visit: Has Dressing in Place as Prescribed: Yes Pain Present Now: No Electronic Signature(s) Signed: 07/16/2018 2:22:43 PM By: [name], BSN, RN, CWS, Kim RN, BSN Entered By: [name] on 07/16/2018 12:53:21 [name], [name] ([id number]) -------------------------------------------------------------------------------- Clinic Level of Care Assessment Details Patient Name: Wiatrek, Shakeila C. Date of Service: 07/16/2018 12:45 PM Medical Record Number: [id number] Patient Account Number: [id number] Date of Birth/Sex: 06/10/1927 (85 y.o. F) Treating RN: [name] Primary Care [name]: [name] Other Clinician: Referring [name]: [name] Treating [name]/Extender: [name], [name] in Treatment: 16 Clinic Level of Care Assessment Items TOOL 4 Quantity Score []  - Use when only an EandM is performed on  FOLLOW-UP visit 0 ASSESSMENTS - Nursing Assessment / Reassessment X - Reassessment of Co-morbidities (includes updates in patient status) 1 10 X- 1 5 Reassessment of Adherence to Treatment Plan ASSESSMENTS - Wound and Skin Assessment / Reassessment X - Simple Wound Assessment / Reassessment - one wound 1 5 []  - 0 Complex Wound Assessment / Reassessment - multiple wounds []  - 0 Dermatologic / Skin Assessment (not related to wound area) ASSESSMENTS - Focused Assessment []  - Circumferential Edema Measurements - multi extremities 0 []  - 0 Nutritional Assessment / Counseling / Intervention []  - 0 Lower Extremity Assessment (monofilament, tuning fork, pulses) []  - 0 Peripheral Arterial Disease Assessment (using hand held doppler) ASSESSMENTS - Ostomy and/or Continence Assessment and Care []  - Incontinence Assessment and Management 0 []  - 0 Ostomy Care Assessment and Management (repouching, etc.) PROCESS - Coordination of Care X - Simple Patient / Family Education for ongoing care 1 15 []  - 0 Complex (extensive) Patient / Family Education for ongoing care []  - 0 Staff obtains Programmer, systems, Records, Test Results / Process Orders []  - 0 Staff telephones HHA, Nursing Homes / Clarify orders / etc []  - 0 Routine Transfer to another Facility (non-emergent condition) []  - 0 Routine Hospital Admission (non-emergent condition) []  - 0 New Admissions / Biomedical engineer / Ordering NPWT, Apligraf, etc. []  - 0 Emergency Hospital Admission (emergent condition) X- 1 10 Simple Discharge Coordination [name], [name] ([id number]) []  - 0 Complex (extensive) Discharge Coordination PROCESS - Special Needs []  - Pediatric / Minor Patient Management 0 []  - 0 Isolation Patient Management []  - 0 Hearing / Language / Visual special needs []  - 0 Assessment of Community assistance (transportation, D/C planning, etc.) []  - 0 Additional assistance / Altered mentation []  - 0 Support Surface(s)  Assessment (bed, cushion, seat, etc.) INTERVENTIONS - Wound Cleansing / Measurement X - Simple Wound Cleansing - one wound 1 5 []  - 0 Complex Wound Cleansing -  multiple wounds X- 1 5 Wound Imaging (photographs - any number of wounds) []  - 0 Wound Tracing (instead of photographs) X- 1 5 Simple Wound Measurement - one wound []  - 0 Complex Wound Measurement - multiple wounds INTERVENTIONS - Wound Dressings X - Small Wound Dressing one or multiple wounds 1 10 []  - 0 Medium Wound Dressing one or multiple wounds []  - 0 Large Wound Dressing one or multiple wounds []  - 0 Application of Medications - topical []  - 0 Application of Medications - injection INTERVENTIONS - Miscellaneous []  - External ear exam 0 []  - 0 Specimen Collection (cultures, biopsies, blood, body fluids, etc.) []  - 0 Specimen(s) / Culture(s) sent or taken to Lab for analysis []  - 0 Patient Transfer (multiple staff / Civil Service fast streamer / Similar devices) []  - 0 Simple Staple / Suture removal (25 or less) []  - 0 Complex Staple / Suture removal (26 or more) []  - 0 Hypo / Hyperglycemic Management (close monitor of Blood Glucose) []  - 0 Ankle / Brachial Index (ABI) - do not check if billed separately X- 1 5 Vital Signs [name], [name] ([id number]) Has the patient been seen at the hospital within the last three years: Yes Total Score: 75 Level Of Care: New/Established - Level 2 Electronic Signature(s) Signed: 07/16/2018 2:00:35 PM By: [name] Entered By: [name] on 07/16/2018 13:18:45 [name] ([id number]) -------------------------------------------------------------------------------- Encounter Discharge Information Details Patient Name: [name], [name] Date of Service: 07/16/2018 12:45 PM Medical Record Number: [id number] Patient Account Number: [id number] Date of Birth/Sex: Feb 15, 1928 (85 y.o. F) Treating RN: [name] Primary Care Shilo Philipson: [name] Other  Clinician: Referring [name]: [name] Treating [name]/Extender: [name], [name] in Treatment: 16 Encounter Discharge Information Items Discharge Condition: Stable Ambulatory Status: [name] Discharge Destination: Home Transportation: Private Auto Accompanied By: self Schedule Follow-up Appointment: Yes Clinical Summary of Care: Electronic Signature(s) Signed: 07/16/2018 2:22:43 PM By: [name], BSN, RN, CWS, Kim RN, BSN Entered By: [name] on 07/16/2018 13:28:53 Mallin, [name] ([id number]) -------------------------------------------------------------------------------- Lower Extremity Assessment Details Patient Name: [name], [name] Date of Service: 07/16/2018 12:45 PM Medical Record Number: [id number] Patient Account Number: [id number] Date of Birth/Sex: 02/28/1928 (85 y.o. F) Treating RN: [name] Primary Care Geoge Lawrance: [name] Other Clinician: Referring Dariusz Brase: [name] Treating Catera Hankins/Extender: [name], [name] in Treatment: 16 Edema Assessment Assessed: [Left: No] [Right: No] [Left: Edema] [Right: :] Calf Left: Right: Point of Measurement: 30 cm From Medial Instep cm 28 cm Ankle Left: Right: Point of Measurement: 12 cm From Medial Instep cm 19.2 cm Vascular Assessment Pulses: Dorsalis Pedis Palpable: [Right:Yes] Electronic Signature(s) Signed: 07/16/2018 2:22:43 PM By: [name], BSN, RN, CWS, Kim RN, BSN Entered By: [name] on 07/16/2018 13:02:11 Himmelberger, [name] ([id number]) -------------------------------------------------------------------------------- Multi Wound Chart Details Patient Name: [name], [name] Date of Service: 07/16/2018 12:45 PM Medical Record Number: [id number] Patient Account Number: [id number] Date of Birth/Sex: 10-21-1927 (85 y.o. F) Treating RN: [name] Primary Care Daeshaun Specht: [name] Other Clinician: Referring Jhostin Epps: [name] Treating Quinetta Shilling/Extender: STONE III, [name] in Treatment: 16 Vital Signs Height(in): 58 Pulse(bpm): 70 Weight(lbs): 140 Blood Pressure(mmHg): 111/51 Body Mass Index(BMI): 29 Temperature(F): 98.2 Respiratory Rate 16 (breaths/min): Photos: [9:No Photos] [N/A:N/A] Wound Location: Right Lower Leg - Lateral Right, Dorsal Foot N/A Wounding Event: Trauma Trauma N/A Primary Etiology: Diabetic Wound/Ulcer of the Trauma, Other N/A Lower Extremity Comorbid History: Cataracts, Anemia, N/A N/A Arrhythmia, Coronary Artery Disease, Hypertension, Myocardial Infarction, Type  II Diabetes, Osteoarthritis Date Acquired: 03/09/2018 06/10/2018 N/A [name] of Treatment: 16 5 N/A Wound Status: Open Healed - Epithelialized N/A Measurements L x W x D 3.5x1.2x0.1 0x0x0 N/A (cm) Area (cm) : 3.299 0 N/A Volume (cm) : 0.33 0 N/A % Reduction in Area: -75.00% 100.00% N/A % Reduction in Volume: 41.60% 100.00% N/A Classification: Grade 2 Partial Thickness N/A Exudate Amount: Medium N/A N/A Exudate Type: Serous N/A N/A Exudate Color: amber N/A N/A Wound Margin: Flat and Intact N/A N/A Granulation Amount: None Present (0%) N/A N/A Necrotic Amount: Large (67-100%) N/A N/A Exposed Structures: Fat Layer (Subcutaneous N/A N/A Tissue) Exposed: Yes Fascia: No Tendon: No Fierro, Gennie C. ([id number]) Muscle: No Joint: No Bone: No Epithelialization: None N/A N/A Periwound Skin Texture: Excoriation: No No Abnormalities Noted N/A Induration: No Callus: No Crepitus: No Rash: No Scarring: No Periwound Skin Moisture: Maceration: No No Abnormalities Noted N/A Dry/Scaly: No Periwound Skin Color: Atrophie Blanche: No No Abnormalities Noted N/A Cyanosis: No Ecchymosis: No Erythema: No Hemosiderin Staining: No Mottled: No Pallor: No Rubor: No Temperature: No Abnormality N/A N/A Tenderness on Palpation: Yes No N/A Treatment Notes Electronic Signature(s) Signed: 07/16/2018 2:00:35 PM By:  [name] Entered By: [name] on 07/16/2018 13:15:26 Franchino, [name] ([id number]) -------------------------------------------------------------------------------- [place] Details Patient Name: [name], [name] Date of Service: 07/16/2018 12:45 PM Medical Record Number: [id number] Patient Account Number: [id number] Date of Birth/Sex: Feb 01, 1928 (85 y.o. F) Treating RN: [name] Primary Care Tris Howell: [name] Other Clinician: Referring Burlon Centrella: [name] Treating Mate Alegria/Extender: [name], [name] in Treatment: 16 Active Inactive Abuse / Safety / Falls / Self Care Management Nursing Diagnoses: Potential for falls Goals: Patient will not experience any injury related to falls Date Initiated: 03/23/2018 Target Resolution Date: 06/09/2018 Goal Status: Active Interventions: Assess fall risk on admission and as needed Notes: Orientation to the Wound Care Program Nursing Diagnoses: Knowledge deficit related to the wound healing center program Goals: Patient/caregiver will verbalize understanding of the [facility] Program Date Initiated: 03/23/2018 Target Resolution Date: 06/09/2018 Goal Status: Active Interventions: Provide education on orientation to the wound center Notes: Pain, Acute or Chronic Nursing Diagnoses: Pain, acute or chronic: actual or potential Goals: Patient will verbalize adequate pain control and receive pain control interventions during procedures as needed Date Initiated: 03/23/2018 Target Resolution Date: 06/09/2018 Goal Status: Active Interventions: Assess comfort goal upon admission Meraz, Onika C. ([id number]) Notes: Wound/Skin Impairment Nursing Diagnoses: Impaired tissue integrity Goals: Ulcer/skin breakdown will heal within 14 [name] Date Initiated: 03/23/2018 Target Resolution Date: 06/09/2018 Goal Status: Active Interventions: Assess patient/caregiver ability to obtain  necessary supplies Assess patient/caregiver ability to perform ulcer/skin care regimen upon admission and as needed Assess ulceration(s) every visit Notes: Electronic Signature(s) Signed: 07/16/2018 2:00:35 PM By: [name] Entered By: [name] on 07/16/2018 13:15:15 Langille, Luan C. ([id number]) -------------------------------------------------------------------------------- Pain Assessment Details Patient Name: [name], [name] Date of Service: 07/16/2018 12:45 PM Medical Record Number: [id number] Patient Account Number: [id number] Date of Birth/Sex: 1927-10-21 (85 y.o. F) Treating RN: [name] Primary Care Malan Werk: [name] Other Clinician: Referring Aston Lieske: [name] Treating Dimples Probus/Extender: [name], [name] in Treatment: 16 Active Problems Location of Pain Severity and Description of Pain Patient Has [name] No Site Locations Pain Management and Medication Current Pain Management: Electronic Signature(s) Signed: 07/16/2018 2:22:43 PM By: [name], BSN, RN, CWS, Kim RN, BSN Entered By: [name] on 07/16/2018 12:53:28 Dasher, [name] ([id number]) -------------------------------------------------------------------------------- Patient/Caregiver Education Details Patient Name: [name], [name]  Date of Service: 07/16/2018 12:45 PM Medical Record Number: [id number] Patient Account Number: [id number] Date of Birth/Gender: 22-Sep-1927 (85 y.o. F) Treating RN: [name] Primary Care Physician: [name] Other Clinician: Referring Physician: Ria [name] Treating Physician/Extender: Sharalyn Ink in Treatment: 16 Education Assessment Education Provided To: Patient Education Topics Provided Wound/Skin Impairment: Handouts: Caring for Your Ulcer Methods: Demonstration, Explain/Verbal Responses: State content correctly Electronic Signature(s) Signed: 07/16/2018 2:00:35 PM By: [name] Entered  By: [name] on 07/16/2018 13:15:41 [name], [name] ([id number]) -------------------------------------------------------------------------------- Wound Assessment Details Patient Name: [name], [name] Date of Service: 07/16/2018 12:45 PM Medical Record Number: [id number] Patient Account Number: [id number] Date of Birth/Sex: [birth year] (85 y.o. F) Treating RN: [name] Primary Care Kynnedy Carreno: [name] Other Clinician: Referring Nabeel Gladson: [name] Treating Damone Fancher/Extender: [name], [name] in Treatment: 16 Wound Status Wound Number: 8 Primary Diabetic Wound/Ulcer of the Lower Extremity Etiology: Wound Location: Right Lower Leg - Lateral Wound Open Wounding Event: Trauma Status: Date Acquired: 03/09/2018 Comorbid Cataracts, Anemia, Arrhythmia, Coronary Artery [name] Of Treatment: 16 History: Disease, Hypertension, Myocardial Infarction, Clustered Wound: No Type II Diabetes, Osteoarthritis Photos Wound Measurements Length: (cm) 3.5 % Reduction in Width: (cm) 1.2 % Reduction in Depth: (cm) 0.1 Epithelializat Area: (cm) 3.299 Tunneling: Volume: (cm) 0.33 Undermining: Area: -75% Volume: 41.6% ion: None No No Wound Description Classification: Grade 2 Foul Odor Aft Wound Margin: Flat and Intact Slough/Fibrin Exudate Amount: Medium Exudate Type: Serous Exudate Color: amber er Cleansing: No o Yes Wound Bed Granulation Amount: None Present (0%) Exposed Structure Necrotic Amount: Large (67-100%) Fascia Exposed: No Necrotic Quality: Adherent Slough Fat Layer (Subcutaneous Tissue) Exposed: Yes Tendon Exposed: No Muscle Exposed: No Joint Exposed: No Bone Exposed: No Periwound Skin Texture Texture Color Nogueira, Shaniquia C. ([id number]) No Abnormalities Noted: No No Abnormalities Noted: No Callus: No Atrophie Blanche: No Crepitus: No Cyanosis: No Excoriation: No Ecchymosis: No Induration: No Erythema: No Rash: No Hemosiderin  Staining: No Scarring: No Mottled: No Pallor: No Moisture Rubor: No No Abnormalities Noted: No Dry / Scaly: No Temperature / Pain Maceration: No Temperature: No Abnormality Tenderness on Palpation: Yes Treatment Notes Wound #8 (Right, Lateral Lower Leg) Notes TCA, telfa, conform secured with tape Electronic Signature(s) Signed: 07/16/2018 2:22:43 PM By: [name], BSN, RN, CWS, Kim RN, BSN Entered By: [name] on 07/16/2018 13:00:55 Bugbee, [name] ([id number]) -------------------------------------------------------------------------------- Wound Assessment Details Patient Name: [name], [name] Date of Service: 07/16/2018 12:45 PM Medical Record Number: [id number] Patient Account Number: [id number] Date of Birth/Sex: 1927-10-28 (85 y.o. F) Treating RN: [name] Primary Care Eiley Mcginnity: [name] Other Clinician: Referring Lilyona Richner: [name] Treating Darius Fillingim/Extender: STONE III, [name] in Treatment: 16 Wound Status Wound Number: 9 Primary Etiology: Trauma, Other Wound Location: Right, Dorsal Foot Wound Status: Healed - Epithelialized Wounding Event: Trauma Date Acquired: 06/10/2018 [name] Of Treatment: 5 Clustered Wound: No Photos Photo Uploaded By: [name] on 07/16/2018 14:44:11 Wound Measurements Length: (cm) 0 Width: (cm) 0 Depth: (cm) 0 Area: (cm) 0 Volume: (cm) 0 % Reduction in Area: 100% % Reduction in Volume: 100% Wound Description Classification: Partial Thickness Periwound Skin Texture Texture Color No Abnormalities Noted: No No Abnormalities Noted: No Moisture No Abnormalities Noted: No Electronic Signature(s) Signed: 07/16/2018 2:22:43 PM By: [name], BSN, RN, CWS, Kim RN, BSN Entered By: [name] on 07/16/2018 12:58:40 Delira, [name] ([id number]) -------------------------------------------------------------------------------- Vitals Details Patient Name: Priebe, Kaelyn  C. Date of Service: 07/16/2018 12:45 PM Medical Record  Number: [id number] Patient Account Number: [id number] Date of Birth/Sex: 26-Jan-1928 (85 y.o. F) Treating RN: [name] Primary Care Daffney Greenly: [name] Other Clinician: Referring Jeorgia Helming: [name] Treating Shaquayla Klimas/Extender: [name], [name] in Treatment: 16 Vital Signs Time Taken: 12:53 Temperature (F): 98.2 Height (in): 58 Pulse (bpm): 70 Weight (lbs): 140 Respiratory Rate (breaths/min): 16 Body Mass Index (BMI): 29.3 Blood Pressure (mmHg): 111/51 Reference Range: 80 - 120 mg / dl Electronic Signature(s) Signed: 07/16/2018 2:22:43 PM By: [name], BSN, RN, CWS, Kim RN, BSN Entered By: [name] on 07/16/2018 12:53:47

## 2018-07-17 NOTE — Progress Notes (Signed)
[name] ([id number]) Visit Report for 07/16/2018 Chief Complaint Document Details Patient Name: [name], [name] Date of Service: 07/16/2018 12:45 PM Medical Record Number: [id number] Patient Account Number: [id number] Date of Birth/Sex: 1928/01/03 (85 y.o. F) Treating RN: [name] Primary Care Provider: Ria [name] Other Clinician: Referring Provider: Ria [name] Treating Provider/Extender: [name], [name] [name] in Treatment: 16 Information Obtained from: Patient Chief Complaint RLE wounds Electronic Signature(s) Signed: 07/16/2018 11:22:15 PM By: [name] Keeler PA-C Entered By: [name] on 07/16/2018 13:00:59 [name] ([id number]) -------------------------------------------------------------------------------- HPI Details Patient Name: [name], [name] Date of Service: 07/16/2018 12:45 PM Medical Record Number: [id number] Patient Account Number: [id number] Date of Birth/Sex: 1927-08-02 (85 y.o. F) Treating RN: [name] Primary Care Provider: Ria [name] Other Clinician: Referring Provider: Ria [name] Treating Provider/Extender: [name], Farzad Tibbetts [name] in Treatment: 16 History of Present Illness HPI Description: Readmission: 03/23/18 on evaluation today patient presents for readmission entire clinic concerning issues that she has been having for the past couple of [name] in regard to her right lower extremity. The medial injury actually occurred when her dog tried to take a blanket from her and subsequently scratched her with his toenail. Subsequently the lateral injury was a wound where she dropped a knife in her kitchen and hit the side of her leg. With that being said both have been open for some time and home health nurse that has been coming out marked for her where the redness was several days ago. Nonetheless this has spread will be on the margin of what was marked. She was placed on Keflex which was started two  days ago but at this point it does not seem to have helped with any improvement in the overall appearance of the wounds. No fevers, chills, nausea, or vomiting noted at this time. 04/02/18 Seen today for follow up and management of right lower extremity wounds. Right lateral wound with adherent slough. She is complaining of pain to touch which limited exam. Recently treated in the ED for cellulitis and wound culture positive for MRSA. Completed a dose of Vancomycin and transitioned to doxycycline by mouth. Erythema remains present with improvement since last visit. Right medial wound with scab present. No fevers, chills, nausea, or vomiting noted at this time. 04/10/18 on evaluation today patient appears to be doing a [name] better in regard to her right lateral lower [name] ulcer. The slough does seem to be coming off although it is slow from the wound bed. Fortunately there does not appear to be any evidence of infection at this time which is good news. With that being said I do believe that she is going to require longer treatment with the Santyl simply due to the fact that I'm not able to really debride the wound due to the pain that she is experiencing. 04/16/18 upon evaluation today patient's wound bed currently on the lateral lower extremity on the right appears to be doing a [name] better at this point. We are utilizing sensible currently. Overall I'm very pleased with how things are progressing although there still is some work to do before this will be completely healed. No fevers, chills, nausea, or vomiting noted at this time. 04/23/18 on evaluation today patient appears to be doing about the same at this point maybe just a [name] bit smaller than last week's evaluation. She continues to have some slough buildup on the surface of the wound and has very [name] granulation unfortunately. This seems to be very slow moving  I'm getting somewhat concerned about the possibility of her  having decreased blood flow which is leading to her subsequently not being able to heal this area as quickly as she shut otherwise. She is currently utilizing Santyl. 05/01/18 on evaluation today patient appears to be doing somewhat poorly in regard to her lower extremity ulcer. At this point she's been tolerating the dressing changes to some degree without complication. With that being said she does note of the past several days that she has been having more pain unfortunately. On inspection it appears that she may have signs of infection which would account for the pain. 05/07/18 on evaluation today patient's wound actually appears to be doing better she's not having as much erythema in the right lower extremity as compared to last week. Fortunately there's no signs of infection this is good news as well. Overall very pleased with the progress that she has made up to this point. 05/14/18 on evaluation today patient actually appears to be doing [name] well in regard to her right lower extremity ulcer. She does not have erythema that was characterizing the cellulitis prior to initiation of antibiotic therapy. With that being said her wound still appears to be somewhat slow in healing I believe this may be arterial in nature that is my concern at least. With that being said I do want her to see the vascular specialist as soon as possible we been trying to get this set up I'm not sure if she's not answering the phone or what exactly is going on here. [name] ([id number]) 05/21/18 on evaluation today patient's wound actually appears to be doing a [name] better possibly some additional granulation compared to my last evaluation. With that being said she still continues to have a lot of pain especially when her legs are elevated such as sitting in the chair here in the office. There does not appear to be any signs of infection which is good news. 05/28/18 on evaluation today patient appears to be  doing a [name] better in regard to her right lateral lower [name] ulcer. Again she was supposed to have an appointment with vascular unfortunately they attempted to call her three times and unfortunately were unable to get anything scheduled. That reason closed up for him. She tells me that if the phone is answered and she doesn't recognize what is that she will hang up as well those that she lives with. Obviously she's not gonna recognize somebody calling from the office that she has not previously spoken with. Nonetheless the patient states that she does not remember any fall. I think if we're gonna get her in with vascular specialist we're gonna have to try to make the appointment for her. 06/04/2018 Seen today for follow up and management of right lateral lower extremity wound. Adherent slough present over surface of wound. Scheduled to follow up for ABI/TBI on 06/06/2018 with vein and vascular specialist. She is hard of hearing. Wound is sensitive to touch. No signs or symptoms of infection. Denies any recent fevers, chills, or SOB. 06/11/18 on evaluation today patient actually appears to be doing very well in regard to her right lateral lower extremity ulcer. She's been tolerating the dressing changes without complication. Fortunately there's no evidence of infection. With that being said she did have her arterial studies which revealed that she had a very low ABI which was 0.67 on the right with a TBI of 0.32 and 0.65 on the left with a TBI of 0.16. I  do believe she needs to see vascular and we are working on that referral as well. 06/21/18 on evaluation today patient's wound actually appears to be doing okay she does have an appointment with vascular coming up on the 24th. Nonetheless she's been having some issues that are more urinary in nature right now that are causing trouble for her. Fortunately there's no signs of severe sepsis although she is running a low-grade temperature 99.9  on evaluation today. She also is having some issues right now with chills as well as urinary urgency and frequency. Again I feel like the city is more urinary and symptomology I do not believe she has anything upper respiratory which is good news. Nonetheless I think this may need to be checked out more quickly especially in light of the fact that she's having significant back pain at this point. 07/02/18 on evaluation today patient appears to be doing okay in regard to her ulcer on the right lower extremity. She's been tolerating the dressing changes without complication. Fortunately there does not appear to be any signs of infection which is good news. No fevers, chills, nausea, or vomiting noted at this time. 07/10/18 on evaluation today patient appears to be doing well in regard to her right lateral lower extremity ulcer all things considering. She's been using this form this seems to be the one thing that she can really do on her own and she seems to be making some progress obviously help with this matter. I think the biggest issue limiting her feeling speed is her blood flow she canceled her original appointment with vascular her next one is not till May. She was worried about the Covid-19 Virus and having to see another physician. Nonetheless there's no signs of active infection at this time. 07/16/18 on evaluation today patient appears to be doing okay in regard to her right lateral lower extremity ulcer. She's been tolerating the dressing changes without complication. Fortunately there's no signs of active infection at this time. No fevers, chills, nausea, or vomiting noted at this time. Electronic Signature(s) Signed: 07/16/2018 11:22:15 PM By: [name] Keeler PA-C Entered By: [name] on 07/16/2018 13:23:33 [name] ([id number]) -------------------------------------------------------------------------------- Physical Exam Details Patient Name: Golda, Ashayla C. Date of  Service: 07/16/2018 12:45 PM Medical Record Number: [id number] Patient Account Number: [id number] Date of Birth/Sex: Oct 17, 1927 (85 y.o. F) Treating RN: [name] Primary Care Provider: Ria [name] Other Clinician: Referring Provider: Ria [name] Treating Provider/Extender: STONE III, Ervey Fallin [name] in Treatment: 64 Constitutional Well-nourished and well-hydrated in no acute distress. Respiratory normal breathing without difficulty. clear to auscultation bilaterally. Cardiovascular regular rate and rhythm with normal S1, S2. Psychiatric this patient is able to make decisions and demonstrates good insight into disease process. Alert and Oriented x 3. pleasant and cooperative. Notes Patient's progress is very slow I think a big portion of this is actually due to her blood flow which is somewhat compromised. She has pushed back her appointment so that her appointment with vascular is not until the middle of May. In the meantime we're doing the best we can to try to manage this and help this the slowly healed Vita by Hessel. Electronic Signature(s) Signed: 07/16/2018 11:22:15 PM By: [name] Keeler PA-C Entered By: [name] on 07/16/2018 13:24:00 [name] ([id number]) -------------------------------------------------------------------------------- Physician Orders Details Patient Name: Wigen, Brylynn C. Date of Service: 07/16/2018 12:45 PM Medical Record Number: [id number] Patient Account Number: [id number] Date of Birth/Sex: 08-15-1927 (85 y.o. F) Treating RN:  [name] Primary Care Provider: Ria [name] Other Clinician: Referring Provider: Ria [name] Treating Provider/Extender: [name], Romulus Hanrahan [name] in Treatment: 16 Verbal / Phone Orders: No Diagnosis Coding ICD-10 Coding Code Description I87.331 Chronic venous hypertension (idiopathic) with ulcer and inflammation of right lower extremity E11.622 Type 2 diabetes mellitus with other  skin ulcer L97.812 Non-pressure chronic ulcer of other part of right lower leg with fat layer exposed L03.115 Cellulitis of right lower limb Wound Cleansing Wound #8 Right,Lateral Lower Leg o Clean wound with Normal Saline. Anesthetic (add to Medication List) Wound #8 Right,Lateral Lower Leg o Topical Lidocaine 4% cream applied to wound bed prior to debridement (In Clinic Only). Primary Wound Dressing Wound #8 Right,Lateral Lower Leg o Other: - Antibiotic ointment Secondary Dressing Wound #8 Right,Lateral Lower Leg o ABD and Kerlix/Conform o Non-adherent pad Dressing Change Frequency Wound #8 Right,Lateral Lower Leg o Change dressing every day. Follow-up Appointments Wound #8 Right,Lateral Lower Leg o Return Appointment in 1 week. Edema Control Wound #8 Right,Lateral Lower Leg o Elevate legs to the level of the heart and pump ankles as often as possible o Other: - tubigrip Electronic Signature(s) Signed: 07/16/2018 2:00:35 PM By: Juliane Lack ([id number]) Signed: 07/16/2018 11:22:15 PM By: [name] Keeler PA-C Entered By: [name] on 07/16/2018 13:20:28 [name], [name] ([id number]) -------------------------------------------------------------------------------- Problem List Details Patient Name: [name], [name] Date of Service: 07/16/2018 12:45 PM Medical Record Number: [id number] Patient Account Number: [id number] Date of Birth/Sex: Nov 08, 1927 (85 y.o. F) Treating RN: [name] Primary Care Provider: Ria [name] Other Clinician: Referring Provider: Ria [name] Treating Provider/Extender: [name], Bryonna Sundby [name] in Treatment: 16 Active Problems ICD-10 Evaluated Encounter Code Description Active Date Today Diagnosis I87.331 Chronic venous hypertension (idiopathic) with ulcer and 03/23/2018 No Yes inflammation of right lower extremity E11.622 Type 2 diabetes mellitus with other skin ulcer 03/23/2018 No  Yes L97.812 Non-pressure chronic ulcer of other part of right lower leg 03/23/2018 No Yes with fat layer exposed L03.115 Cellulitis of right lower limb 03/23/2018 No Yes Inactive Problems Resolved Problems Electronic Signature(s) Signed: 07/16/2018 11:22:15 PM By: [name] Keeler PA-C Entered By: [name] on 07/16/2018 13:00:54 Postlewaite, [name] ([id number]) -------------------------------------------------------------------------------- Progress Note Details Patient Name: [name], [name] Date of Service: 07/16/2018 12:45 PM Medical Record Number: [id number] Patient Account Number: [id number] Date of Birth/Sex: July 26, 1927 (85 y.o. F) Treating RN: [name] Primary Care Provider: Ria [name] Other Clinician: Referring Provider: Ria [name] Treating Provider/Extender: [name], Kauan Kloosterman [name] in Treatment: 16 Subjective Chief Complaint Information obtained from Patient RLE wounds History of Present Illness (HPI) Readmission: 03/23/18 on evaluation today patient presents for readmission entire clinic concerning issues that she has been having for the past couple of [name] in regard to her right lower extremity. The medial injury actually occurred when her dog tried to take a blanket from her and subsequently scratched her with his toenail. Subsequently the lateral injury was a wound where she dropped a knife in her kitchen and hit the side of her leg. With that being said both have been open for some time and home health nurse that has been coming out marked for her where the redness was several days ago. Nonetheless this has spread will be on the margin of what was marked. She was placed on Keflex which was started two days ago but at this point it does not seem to have helped with any improvement in the overall appearance of the wounds. No fevers, chills, nausea, or vomiting  noted at this time. 04/02/18 Seen today for follow up and management of right lower  extremity wounds. Right lateral wound with adherent slough. She is complaining of pain to touch which limited exam. Recently treated in the ED for cellulitis and wound culture positive for MRSA. Completed a dose of Vancomycin and transitioned to doxycycline by mouth. Erythema remains present with improvement since last visit. Right medial wound with scab present. No fevers, chills, nausea, or vomiting noted at this time. 04/10/18 on evaluation today patient appears to be doing a [place] better in regard to her right lateral lower [name] ulcer. The slough does seem to be coming off although it is slow from the wound bed. Fortunately there does not appear to be any evidence of infection at this time which is good news. With that being said I do believe that she is going to require longer treatment with the Santyl simply due to the fact that I'm not able to really debride the wound due to the pain that she is experiencing. 04/16/18 upon evaluation today patient's wound bed currently on the lateral lower extremity on the right appears to be doing a [place] better at this point. We are utilizing sensible currently. Overall I'm very pleased with how things are progressing although there still is some work to do before this will be completely healed. No fevers, chills, nausea, or vomiting noted at this time. 04/23/18 on evaluation today patient appears to be doing about the same at this point maybe just a [place] bit smaller than last week's evaluation. She continues to have some slough buildup on the surface of the wound and has very [place] granulation unfortunately. This seems to be very slow moving I'm getting somewhat concerned about the possibility of her having decreased blood flow which is leading to her subsequently not being able to heal this area as quickly as she shut otherwise. She is currently utilizing Santyl. 05/01/18 on evaluation today patient appears to be doing somewhat poorly in regard to her  lower extremity ulcer. At this point she's been tolerating the dressing changes to some degree without complication. With that being said she does note of the past several days that she has been having more pain unfortunately. On inspection it appears that she may have signs of infection which would account for the pain. 05/07/18 on evaluation today patient's wound actually appears to be doing better she's not having as much erythema in the right lower extremity as compared to last week. Fortunately there's no signs of infection this is good news as well. Overall very pleased with the progress that she has made up to this point. LASHANE, WHELPLEY ([id number]) 05/14/18 on evaluation today patient actually appears to be doing [name] well in regard to her right lower extremity ulcer. She does not have erythema that was characterizing the cellulitis prior to initiation of antibiotic therapy. With that being said her wound still appears to be somewhat slow in healing I believe this may be arterial in nature that is my concern at least. With that being said I do want her to see the vascular specialist as soon as possible we been trying to get this set up I'm not sure if she's not answering the phone or what exactly is going on here. 05/21/18 on evaluation today patient's wound actually appears to be doing a [place] better possibly some additional granulation compared to my last evaluation. With that being said she still continues to have a lot of pain especially when  her legs are elevated such as sitting in the chair here in the office. There does not appear to be any signs of infection which is good news. 05/28/18 on evaluation today patient appears to be doing a [place] better in regard to her right lateral lower [name] ulcer. Again she was supposed to have an appointment with vascular unfortunately they attempted to call her three times and unfortunately were unable to get anything scheduled. That reason  closed up for him. She tells me that if the phone is answered and she doesn't recognize what is that she will hang up as well those that she lives with. Obviously she's not gonna recognize somebody calling from the office that she has not previously spoken with. Nonetheless the patient states that she does not remember any fall. I think if we're gonna get her in with vascular specialist we're gonna have to try to make the appointment for her. 06/04/2018 Seen today for follow up and management of right lateral lower extremity wound. Adherent slough present over surface of wound. Scheduled to follow up for ABI/TBI on 06/06/2018 with vein and vascular specialist. She is hard of hearing. Wound is sensitive to touch. No signs or symptoms of infection. Denies any recent fevers, chills, or SOB. 06/11/18 on evaluation today patient actually appears to be doing very well in regard to her right lateral lower extremity ulcer. She's been tolerating the dressing changes without complication. Fortunately there's no evidence of infection. With that being said she did have her arterial studies which revealed that she had a very low ABI which was 0.67 on the right with a TBI of 0.32 and 0.65 on the left with a TBI of 0.16. I do believe she needs to see vascular and we are working on that referral as well. 06/21/18 on evaluation today patient's wound actually appears to be doing okay she does have an appointment with vascular coming up on the 24th. Nonetheless she's been having some issues that are more urinary in nature right now that are causing trouble for her. Fortunately there's no signs of severe sepsis although she is running a low-grade temperature 99.9 on evaluation today. She also is having some issues right now with chills as well as urinary urgency and frequency. Again I feel like the city is more urinary and symptomology I do not believe she has anything upper respiratory which is good news. Nonetheless I  think this may need to be checked out more quickly especially in light of the fact that she's having significant back pain at this point. 07/02/18 on evaluation today patient appears to be doing okay in regard to her ulcer on the right lower extremity. She's been tolerating the dressing changes without complication. Fortunately there does not appear to be any signs of infection which is good news. No fevers, chills, nausea, or vomiting noted at this time. 07/10/18 on evaluation today patient appears to be doing well in regard to her right lateral lower extremity ulcer all things considering. She's been using this form this seems to be the one thing that she can really do on her own and she seems to be making some progress obviously help with this matter. I think the biggest issue limiting her feeling speed is her blood flow she canceled her original appointment with vascular her next one is not till May. She was worried about the Covid-19 Virus and having to see another physician. Nonetheless there's no signs of active infection at this time. 07/16/18 on evaluation  today patient appears to be doing okay in regard to her right lateral lower extremity ulcer. She's been tolerating the dressing changes without complication. Fortunately there's no signs of active infection at this time. No fevers, chills, nausea, or vomiting noted at this time. Patient History Information obtained from Patient. Family History Diabetes - Father,Siblings,Child, Hypertension - Child, Lung Disease - Child, Stroke - Mother, No family history of Cancer, Heart Disease, Kidney Disease, Seizures, Thyroid Problems. Social History MOSELLE, RISTER ([id number]) Former smoker, Marital Status - Single, Alcohol Use - Never, Drug Use - No History, Caffeine Use - Daily. Medical History Eyes Patient has history of Cataracts - removed Denies history of Glaucoma, Optic Neuritis Hematologic/Lymphatic Patient has history of  Anemia Denies history of Hemophilia, Human Immunodeficiency Virus, Lymphedema, Sickle Cell Disease Respiratory Denies history of Aspiration, Asthma, Chronic Obstructive Pulmonary Disease (COPD), Pneumothorax, Sleep Apnea, Tuberculosis Cardiovascular Patient has history of Arrhythmia, Coronary Artery Disease, Hypertension, Myocardial Infarction Denies history of Angina, Congestive Heart Failure, Deep Vein Thrombosis, Hypotension, Peripheral Arterial Disease, Peripheral Venous Disease, Phlebitis Endocrine Patient has history of Type II Diabetes Denies history of Type I Diabetes Genitourinary Denies history of End Stage Renal Disease Immunological Denies history of Lupus Erythematosus, Raynaud s, Scleroderma Integumentary (Skin) Denies history of History of Burn, History of pressure wounds Musculoskeletal Patient has history of Osteoarthritis Denies history of Gout, Rheumatoid Arthritis, Osteomyelitis Neurologic Denies history of Dementia, Neuropathy, Quadriplegia, Paraplegia, Seizure Disorder Oncologic Denies history of Received Chemotherapy, Received Radiation Medical And Surgical History Notes Constitutional Symptoms (General Health) High Blood Pressure, open heart surgery, gall stone, hysterectomy; Fluid pill, Anxiety Review of Systems (ROS) Constitutional Symptoms (General Health) Denies complaints or symptoms of Fatigue, Fever, Chills, Marked Weight Change. Respiratory Denies complaints or symptoms of Chronic or frequent coughs, Shortness of Breath. Cardiovascular Denies complaints or symptoms of Chest pain, LE edema. Psychiatric Denies complaints or symptoms of Anxiety, Claustrophobia. Objective Constitutional Well-nourished and well-hydrated in no acute distress. [name], Hendrix C. ([id number]) Vitals Time Taken: 12:53 PM, Height: 58 in, Weight: 140 lbs, BMI: 29.3, Temperature: 98.2 F, Pulse: 70 bpm, Respiratory Rate: 16 breaths/min, Blood Pressure: 111/51  mmHg. Respiratory normal breathing without difficulty. clear to auscultation bilaterally. Cardiovascular regular rate and rhythm with normal S1, S2. Psychiatric this patient is able to make decisions and demonstrates good insight into disease process. Alert and Oriented x 3. pleasant and cooperative. General Notes: Patient's progress is very slow I think a big portion of this is actually due to her blood flow which is somewhat compromised. She has pushed back her appointment so that her appointment with vascular is not until the middle of May. In the meantime we're doing the best we can to try to manage this and help this the slowly healed [place] by Panto[place]. Integumentary (Hair, Skin) Wound #8 status is Open. Original cause of wound was Trauma. The wound is located on the Right,Lateral Lower Leg. The wound measures 3.5cm length x 1.2cm width x 0.1cm depth; 3.299cm^2 area and 0.33cm^3 volume. There is Fat Layer (Subcutaneous Tissue) Exposed exposed. There is no tunneling or undermining noted. There is a medium amount of serous drainage noted. The wound margin is flat and intact. There is no granulation within the wound bed. There is a large (67-100%) amount of necrotic tissue within the wound bed including Adherent Slough. The periwound skin appearance did not exhibit: Callus, Crepitus, Excoriation, Induration, Rash, Scarring, Dry/Scaly, Maceration, Atrophie Blanche, Cyanosis, Ecchymosis, Hemosiderin Staining, Mottled, Pallor, Rubor, Erythema. Periwound temperature was noted as No  Abnormality. The periwound has tenderness on palpation. Wound #9 status is Healed - Epithelialized. Original cause of wound was Trauma. The wound is located on the Right,Dorsal Foot. The wound measures 0cm length x 0cm width x 0cm depth; 0cm^2 area and 0cm^3 volume. Assessment Active Problems ICD-10 Chronic venous hypertension (idiopathic) with ulcer and inflammation of right lower extremity Type 2 diabetes mellitus  with other skin ulcer Non-pressure chronic ulcer of other part of right lower leg with fat layer exposed Cellulitis of right lower limb Plan Wound Cleansing: Wound #8 Right,Lateral Lower Leg: Clean wound with Normal Saline. Anesthetic (add to Medication List): Wound #8 Right,Lateral Lower Leg: [name], [name] ([id number]) Topical Lidocaine 4% cream applied to wound bed prior to debridement (In Clinic Only). Primary Wound Dressing: Wound #8 Right,Lateral Lower Leg: Other: - Antibiotic ointment Secondary Dressing: Wound #8 Right,Lateral Lower Leg: ABD and Kerlix/Conform Non-adherent pad Dressing Change Frequency: Wound #8 Right,Lateral Lower Leg: Change dressing every day. Follow-up Appointments: Wound #8 Right,Lateral Lower Leg: Return Appointment in 1 week. Edema Control: Wound #8 Right,Lateral Lower Leg: Elevate legs to the level of the heart and pump ankles as often as possible Other: - tubigrip My suggestion is gonna be that we continue with the above wound care measures for the next week and the patient is in agreement with plan. If anything changes worsens meantime shall contact the office and let me know. Please see above for specific wound care orders. We will see patient for re-evaluation in 1 week(s) here in the clinic. If anything worsens or changes patient will contact our office for additional recommendations. Electronic Signature(s) Signed: 07/16/2018 11:22:15 PM By: [name] Keeler PA-C Entered By: [name] on 07/16/2018 13:24:12 Bocek, [name] ([id number]) -------------------------------------------------------------------------------- ROS/PFSH Details Patient Name: [name], [name] Date of Service: 07/16/2018 12:45 PM Medical Record Number: [id number] Patient Account Number: [id number] Date of Birth/Sex: [birth year] (85 y.o. F) Treating RN: [name] Primary Care Provider: Ria [name] Other Clinician: Referring Provider: Ria [name] Treating Provider/Extender: [name], Lester Platas [name] in Treatment: 16 Information Obtained From Patient Constitutional Symptoms (General Health) Complaints and Symptoms: Negative for: Fatigue; Fever; Chills; Marked Weight Change Medical History: Past Medical History Notes: High Blood Pressure, open heart surgery, gall stone, hysterectomy; Fluid pill, Anxiety Respiratory Complaints and Symptoms: Negative for: Chronic or frequent coughs; Shortness of Breath Medical History: Negative for: Aspiration; Asthma; Chronic Obstructive Pulmonary Disease (COPD); Pneumothorax; Sleep Apnea; Tuberculosis Cardiovascular Complaints and Symptoms: Negative for: Chest pain; LE edema Medical History: Positive for: Arrhythmia; Coronary Artery Disease; Hypertension; Myocardial Infarction Negative for: Angina; Congestive Heart Failure; Deep Vein Thrombosis; Hypotension; Peripheral Arterial Disease; Peripheral Venous Disease; Phlebitis Psychiatric Complaints and Symptoms: Negative for: Anxiety; Claustrophobia Eyes Medical History: Positive for: Cataracts - removed Negative for: Glaucoma; Optic Neuritis Hematologic/Lymphatic Medical History: Positive for: Anemia Negative for: Hemophilia; Human Immunodeficiency Virus; Lymphedema; Sickle Cell Disease Endocrine [name], [name] ([id number]) Medical History: Positive for: Type II Diabetes Negative for: Type I Diabetes Time with diabetes: no idea Treated with: Oral agents Blood sugar tested every day: No Genitourinary Medical History: Negative for: End Stage Renal Disease Immunological Medical History: Negative for: Lupus Erythematosus; Raynaudos; Scleroderma Integumentary (Skin) Medical History: Negative for: History of Burn; History of pressure wounds Musculoskeletal Medical History: Positive for: Osteoarthritis Negative for: Gout; Rheumatoid Arthritis; Osteomyelitis Neurologic Medical History: Negative for: Dementia; Neuropathy;  Quadriplegia; Paraplegia; Seizure Disorder Oncologic Medical History: Negative for: Received Chemotherapy; Received Radiation HBO Extended History Items Eyes: Cataracts Immunizations Pneumococcal Vaccine: Received Pneumococcal Vaccination:  Yes Implantable Devices No devices added Family and Social History Cancer: No; Diabetes: Yes - Father,Siblings,Child; Heart Disease: No; Hypertension: Yes - Child; Kidney Disease: No; Lung Disease: Yes - Child; Seizures: No; Stroke: Yes - Mother; Thyroid Problems: No; Former smoker; Marital Status - Single; Alcohol Use: Never; Drug Use: No History; Caffeine Use: Daily; Financial Concerns: No; Food, Clothing or Shelter Needs: No; Support System Lacking: No; Transportation Concerns: No Physician Affirmation I have reviewed and agree with the above information. SMITH, MCNICHOLAS ([id number]) Electronic Signature(s) Signed: 07/16/2018 2:00:35 PM By: [name] Signed: 07/16/2018 11:22:15 PM By: [name] Keeler PA-C Entered By: [name] on 07/16/2018 13:23:51 Googe, [name] ([id number]) -------------------------------------------------------------------------------- SuperBill Details Patient Name: Roland, Basya C. Date of Service: 07/16/2018 Medical Record Number: [id number] Patient Account Number: [id number] Date of Birth/Sex: 04-29-1927 (85 y.o. F) Treating RN: [name] Primary Care Provider: Ria [name] Other Clinician: Referring Provider: Ria [name] Treating Provider/Extender: [name], Navada Osterhout [name] in Treatment: 16 Diagnosis Coding ICD-10 Codes Code Description I87.331 Chronic venous hypertension (idiopathic) with ulcer and inflammation of right lower extremity E11.622 Type 2 diabetes mellitus with other skin ulcer L97.812 Non-pressure chronic ulcer of other part of right lower leg with fat layer exposed L03.115 Cellulitis of right lower limb Facility Procedures CPT4 Code: 05183358 Description: 25189 -  WOUND CARE VISIT-LEV 2 EST PT Modifier: Quantity: 1 Physician Procedures CPT4: Description Modifier Quantity Code 8421031 99214 - WC PHYS LEVEL 4 - EST PT 1 ICD-10 Diagnosis Description I87.331 Chronic venous hypertension (idiopathic) with ulcer and inflammation of right lower extremity E11.622 Type 2 diabetes mellitus with  other skin ulcer L97.812 Non-pressure chronic ulcer of other part of right lower leg with fat layer exposed L03.115 Cellulitis of right lower limb Electronic Signature(s) Signed: 07/16/2018 11:22:15 PM By: [name] Keeler PA-C Entered By: [name] on 07/16/2018 13:24:24

## 2018-07-18 ENCOUNTER — Telehealth: Payer: Self-pay | Admitting: Family Medicine

## 2018-07-18 ENCOUNTER — Ambulatory Visit (INDEPENDENT_AMBULATORY_CARE_PROVIDER_SITE_OTHER): Payer: Medicare Other | Admitting: Family Medicine

## 2018-07-18 ENCOUNTER — Encounter: Payer: Self-pay | Admitting: Family Medicine

## 2018-07-18 ENCOUNTER — Other Ambulatory Visit: Payer: Self-pay

## 2018-07-18 VITALS — BP 136/64 | HR 72 | Temp 98.6°F | Ht <= 58 in | Wt 138.0 lb

## 2018-07-18 DIAGNOSIS — G8929 Other chronic pain: Secondary | ICD-10-CM

## 2018-07-18 DIAGNOSIS — I2510 Atherosclerotic heart disease of native coronary artery without angina pectoris: Secondary | ICD-10-CM | POA: Diagnosis not present

## 2018-07-18 DIAGNOSIS — S39012A Strain of muscle, fascia and tendon of lower back, initial encounter: Secondary | ICD-10-CM | POA: Diagnosis not present

## 2018-07-18 MED ORDER — HYDROCODONE-ACETAMINOPHEN 5-325 MG PO TABS: 0.5000 | ORAL_TABLET | Freq: Four times a day (QID) | ORAL | 0 refills | Status: DC | PRN

## 2018-07-18 NOTE — Assessment & Plan Note (Signed)
Anticipate R lumbar strain or lumbar DDD flare. Supportive care reviewed including safe heating pad use, continue hydrocodone pain medication - pt requests refill.

## 2018-07-18 NOTE — Progress Notes (Signed)
BP 136/64 (BP Location: Left Arm, Patient Position: Sitting, Cuff Size: Normal)   Pulse 72   Temp 98.6 F (37 C) (Oral)   Ht 4\' 9"  (1.448 m)   Wt 138 lb (62.6 kg)   SpO2 98%   BMI 29.86 kg/m    CC: lower back pain Subjective:    [name], female    DOB: 31-Jan-1928, 85 y.o.   MRN: [id number]  HPI: [name] is a 85 y.o. female presenting on 07/18/2018 for Back Pain (C/o low right side back pain radiating into right hip. )   2 wk h/o painful lump in R lower back traveling up to posterior ribcage and down R hip.   Denies inciting trauma/injury.  No numbness or weakness of legs.  No fever.  No bowel/bladder incontinence.  Some ongoing constipation.      Relevant past medical, surgical, family and social history reviewed and updated as indicated. Interim medical history since our last visit reviewed. Allergies and medications reviewed and updated. Outpatient Medications Prior to Visit  Medication Sig Dispense Refill  . acetaminophen (TYLENOL) 500 MG tablet Take 500 mg by mouth every 6 (six) hours as needed for mild pain.    Marland Kitchen ALPRAZolam (XANAX) 1 MG tablet Take 0.5 mg by mouth 4 (four) times daily as needed for anxiety.     Marland Kitchen aspirin EC 81 MG tablet Take 81 mg by mouth daily.    . collagenase (SANTYL) ointment Apply topically daily. 15 g 0  . diclofenac sodium (VOLTAREN) 1 % GEL Apply 1 application topically 3 (three) times daily. 1 Tube 1  . famotidine (PEPCID) 20 MG tablet Take 1 tablet (20 mg total) by mouth at bedtime.    . furosemide (LASIX) 20 MG tablet TAKE 0.5 TABLETS (10 MG TOTAL) BY MOUTH DAILY. 45 tablet 3  . glucosamine-chondroitin 500-400 MG tablet Take 1 tablet by mouth daily.     Marland Kitchen glucose blood (ONE TOUCH ULTRA TEST) test strip USE AS INSTRUCTED TO CHECK BLOOD SUGAR ONCE A DAY Dx code: E11.9 50 each 5  . loperamide (IMODIUM) 2 MG capsule Take 2 mg by mouth 4 (four) times daily as needed for diarrhea or loose stools.    Marland Kitchen loratadine  (CLARITIN) 10 MG tablet Take 10 mg by mouth daily as needed for allergies.    . metoprolol tartrate (LOPRESSOR) 25 MG tablet TAKE ONE AND HALF TABLETS (37.5 MG TOTAL) BY MOUTH 2 (TWO) TIMES DAILY. 270 tablet 1  . Multiple Vitamin (MULTIVITAMIN) tablet Take 1 tablet by mouth daily. Patient reports takes a new vitamin to replace her centrum as recommended by her eye doctor.    . nitroGLYCERIN (NITROSTAT) 0.4 MG SL tablet Place 1 tablet (0.4 mg total) under the tongue every 5 (five) minutes as needed for chest pain. 25 tablet 3  . nystatin-triamcinolone ointment (MYCOLOG) APPLY 1 APPLICATION TOPICALLY 2 (TWO) TIMES DAILY. AS NEEDED FOR ITCHING 60 g 1  . ONETOUCH VERIO test strip USE TO TEST BLOOD SUGAR ONCE DAILY 100 each 6  . pantoprazole (PROTONIX) 40 MG tablet TAKE 1 TABLET BY MOUTH EVERY DAY 30 tablet 3  . pioglitazone (ACTOS) 30 MG tablet TAKE 1 TABLET BY MOUTH EVERY DAY 90 tablet 3  . polyethylene glycol powder (GLYCOLAX/MIRALAX) powder Take 17 g by mouth daily. Hold for diarrhea 3350 g 1  . pravastatin (PRAVACHOL) 20 MG tablet TAKE 1 TABLET BY MOUTH EVERY DAY 90 tablet 3  . Probiotic Product (ALIGN) 4 MG  CAPS Take 1 capsule (4 mg total) by mouth daily.    . ramipril (ALTACE) 5 MG capsule TAKE ONE CAPSULE BY MOUTH EVERYDAY FOR BLOOD PRESSURE 90 capsule 1  . senna-docusate (SENOKOT-S) 8.6-50 MG tablet Take 1 tablet by mouth daily as needed for moderate constipation. 30 tablet 0  . sitaGLIPtin (JANUVIA) 100 MG tablet Take 1 tablet (100 mg total) by mouth daily. (Patient taking differently: Take 100 mg by mouth daily. Takes 1/2 tablet) 30 tablet 2  . traZODone (DESYREL) 150 MG tablet Take 150 mg by mouth at bedtime.     . triamcinolone cream (KENALOG) 0.1 % APPLY 1 APPLICATION TOPICALLY 2 (TWO) TIMES DAILY. 45 g 1  . vitamin B-12 (CYANOCOBALAMIN) 1000 MCG tablet Take 1,000 mcg by mouth daily.    . vitamin E 400 UNIT capsule Take 400 Units by mouth daily.    Marland Kitchen HYDROcodone-acetaminophen  (NORCO/VICODIN) 5-325 MG tablet Take 0.5 tablets by mouth every 6 (six) hours as needed for moderate pain.    . cephALEXin (KEFLEX) 500 MG capsule Take 1 capsule (500 mg total) by mouth 3 (three) times daily. 21 capsule 0   No facility-administered medications prior to visit.      Per HPI unless specifically indicated in ROS section below Review of Systems Objective:    BP 136/64 (BP Location: Left Arm, Patient Position: Sitting, Cuff Size: Normal)   Pulse 72   Temp 98.6 F (37 C) (Oral)   Ht 4\' 9"  (1.448 m)   Wt 138 lb (62.6 kg)   SpO2 98%   BMI 29.86 kg/m   Wt Readings from Last 3 Encounters:  07/18/18 138 lb (62.6 kg)  06/18/18 137 lb (62.1 kg)  04/27/18 140 lb 8 oz (63.7 kg)    Physical Exam Vitals signs and nursing note reviewed.  Constitutional:      General: She is not in acute distress.    Appearance: Normal appearance. She is ill-appearing (chronic).     Comments: Walks with walker  Musculoskeletal:        General: No swelling.     Comments:  No pain midline spine Discomfort at R lower lumbar paraspinous mm  Neg seated SLR bilaterally. No pain with int/ext rotation at hip - tender at R knee however. No pain at sciatic notch bilaterally No appreciable swelling to right lower back  Neurological:     Mental Status: She is alert.        Assessment & Plan:   Problem List Items Addressed This Visit    Lumbar strain, initial encounter - Primary    Anticipate R lumbar strain or lumbar DDD flare. Supportive care reviewed including safe heating pad use, continue hydrocodone pain medication - pt requests refill.      Encounter for chronic pain management    Pt has decreased hydrocodone use back to 1/2 tab QID.           Meds ordered this encounter  Medications  . HYDROcodone-acetaminophen (NORCO/VICODIN) 5-325 MG tablet    Sig: Take 0.5 tablets by mouth every 6 (six) hours as needed for moderate pain.    Dispense:  60 tablet    Refill:  0   No orders of  the defined types were placed in this encounter.   Follow up plan: No follow-ups on file.  Ria Bush, MD

## 2018-07-18 NOTE — Assessment & Plan Note (Signed)
Pt has decreased hydrocodone use back to 1/2 tab QID.

## 2018-07-18 NOTE — Telephone Encounter (Signed)
Pt left vm wanting to know if an rx was sent to her pharmacy today by Dr. Darnell Level. Checked epic and saw Hydrocodone was sent in. Returned pt's call and made her aware. She had no additional questions at this time. Nothing further is needed

## 2018-07-18 NOTE — Patient Instructions (Addendum)
I think you have a lumbar strain or flare of arthritis of the lower back.  Use heating pad to your back - but not directly on the skin - cover the pad in a towel, use for 10-15 minutes at a time.  May continue hydrocodone pain medicine as needed. Hydrocodone refilled for you. Let us know if not improving over time.

## 2018-07-23 ENCOUNTER — Encounter: Payer: Medicare Other | Admitting: Physician Assistant

## 2018-07-23 ENCOUNTER — Other Ambulatory Visit: Payer: Self-pay

## 2018-07-23 DIAGNOSIS — I252 Old myocardial infarction: Secondary | ICD-10-CM | POA: Diagnosis not present

## 2018-07-23 DIAGNOSIS — E11622 Type 2 diabetes mellitus with other skin ulcer: Secondary | ICD-10-CM | POA: Diagnosis not present

## 2018-07-23 DIAGNOSIS — L03115 Cellulitis of right lower limb: Secondary | ICD-10-CM | POA: Diagnosis not present

## 2018-07-23 DIAGNOSIS — L97812 Non-pressure chronic ulcer of other part of right lower leg with fat layer exposed: Secondary | ICD-10-CM | POA: Diagnosis not present

## 2018-07-23 DIAGNOSIS — I2510 Atherosclerotic heart disease of native coronary artery without angina pectoris: Secondary | ICD-10-CM | POA: Diagnosis not present

## 2018-07-23 DIAGNOSIS — D649 Anemia, unspecified: Secondary | ICD-10-CM | POA: Diagnosis not present

## 2018-07-25 ENCOUNTER — Other Ambulatory Visit: Payer: Self-pay | Admitting: Gynecology

## 2018-07-25 NOTE — Telephone Encounter (Signed)
I will have appointment desk call to schedule annual exam

## 2018-07-26 NOTE — Telephone Encounter (Signed)
Per Butch Penny "She could not make ce at this time. Michela Pitcher she has several appts coming up in may and June and will need to arrange a driver, she took down our # and will c/b

## 2018-07-30 ENCOUNTER — Encounter: Payer: Medicare Other | Admitting: Physician Assistant

## 2018-07-30 ENCOUNTER — Other Ambulatory Visit: Payer: Self-pay

## 2018-07-30 DIAGNOSIS — E11622 Type 2 diabetes mellitus with other skin ulcer: Secondary | ICD-10-CM | POA: Diagnosis not present

## 2018-07-30 DIAGNOSIS — D649 Anemia, unspecified: Secondary | ICD-10-CM | POA: Diagnosis not present

## 2018-07-30 DIAGNOSIS — L03115 Cellulitis of right lower limb: Secondary | ICD-10-CM | POA: Diagnosis not present

## 2018-07-30 DIAGNOSIS — I252 Old myocardial infarction: Secondary | ICD-10-CM | POA: Diagnosis not present

## 2018-07-30 DIAGNOSIS — I2510 Atherosclerotic heart disease of native coronary artery without angina pectoris: Secondary | ICD-10-CM | POA: Diagnosis not present

## 2018-07-30 DIAGNOSIS — L97812 Non-pressure chronic ulcer of other part of right lower leg with fat layer exposed: Secondary | ICD-10-CM | POA: Diagnosis not present

## 2018-07-30 NOTE — Progress Notes (Signed)
[name] ([id number]) Visit Report for 07/30/2018 Chief Complaint Document Details Patient Name: [name], [name] Date of Service: 07/30/2018 2:30 PM Medical Record Number: [id number] Patient Account Number: [id number] Date of Birth/Sex: 28-Feb-1928 (85 y.o. F) Treating RN: Harold Barban Primary Care Provider: Ria Bush Other Clinician: Referring Provider: Ria Bush Treating Provider/Extender: Melburn Hake, HOYT Weeks in Treatment: 18 Information Obtained from: Patient Chief Complaint RLE wounds Electronic Signature(s) Signed: 07/30/2018 5:07:11 PM By: Worthy Keeler PA-C Entered By: Worthy Keeler on 07/30/2018 14:47:07 [name] ([id number]) -------------------------------------------------------------------------------- HPI Details Patient Name: [name], [name] Date of Service: 07/30/2018 2:30 PM Medical Record Number: [id number] Patient Account Number: [id number] Date of Birth/Sex: July 06, 1927 (85 y.o. F) Treating RN: Harold Barban Primary Care Provider: Ria Bush Other Clinician: Referring Provider: Ria Bush Treating Provider/Extender: Melburn Hake, HOYT Weeks in Treatment: 18 History of Present Illness HPI Description: Readmission: 03/23/18 on evaluation today patient presents for readmission entire clinic concerning issues that she has been having for the past couple of weeks in regard to her right lower extremity. The medial injury actually occurred when her dog tried to take a blanket from her and subsequently scratched her with his toenail. Subsequently the lateral injury was a wound where she dropped a knife in her kitchen and hit the side of her leg. With that being said both have been open for some time and home health nurse that has been coming out marked for her where the redness was several days ago. Nonetheless this has spread will be on the margin of what was marked. She was placed on Keflex which was started two days  ago but at this point it does not seem to have helped with any improvement in the overall appearance of the wounds. No fevers, chills, nausea, or vomiting noted at this time. 04/02/18 Seen today for follow up and management of right lower extremity wounds. Right lateral wound with adherent slough. She is complaining of pain to touch which limited exam. Recently treated in the ED for cellulitis and wound culture positive for MRSA. Completed a dose of Vancomycin and transitioned to doxycycline by mouth. Erythema remains present with improvement since last visit. Right medial wound with scab present. No fevers, chills, nausea, or vomiting noted at this time. 04/10/18 on evaluation today patient appears to be doing a Mudgett better in regard to her right lateral lower [name]. The slough does seem to be coming off although it is slow from the wound bed. Fortunately there does not appear to be any evidence of infection at this time which is good news. With that being said I do believe that she is going to require longer treatment with the Santyl simply due to the fact that I'm not able to really debride the wound due to the pain that she is experiencing. 04/16/18 upon evaluation today patient's wound bed currently on the lateral lower extremity on the right appears to be doing a Trudo better at this point. We are utilizing sensible currently. Overall I'm very pleased with how things are progressing although there still is some work to do before this will be completely healed. No fevers, chills, nausea, or vomiting noted at this time. 04/23/18 on evaluation today patient appears to be doing about the same at this point maybe just a Cham bit smaller than last week's evaluation. She continues to have some slough buildup on the surface of the wound and has very Sherwin granulation unfortunately. This seems to be very slow moving  I'm getting somewhat concerned about the possibility of her  having decreased blood flow which is leading to her subsequently not being able to heal this area as quickly as she shut otherwise. She is currently utilizing Santyl. 05/01/18 on evaluation today patient appears to be doing somewhat poorly in regard to her lower extremity ulcer. At this point she's been tolerating the dressing changes to some degree without complication. With that being said she does note of the past several days that she has been having more pain unfortunately. On inspection it appears that she may have signs of infection which would account for the pain. 05/07/18 on evaluation today patient's wound actually appears to be doing better she's not having as much erythema in the right lower extremity as compared to last week. Fortunately there's no signs of infection this is good news as well. Overall very pleased with the progress that she has made up to this point. 05/14/18 on evaluation today patient actually appears to be doing rather well in regard to her right lower extremity ulcer. She does not have erythema that was characterizing the cellulitis prior to initiation of antibiotic therapy. With that being said her wound still appears to be somewhat slow in healing I believe this may be arterial in nature that is my concern at least. With that being said I do want her to see the vascular specialist as soon as possible we been trying to get this set up I'm not sure if she's not answering the phone or what exactly is going on here. [name] ([id number]) 05/21/18 on evaluation today patient's wound actually appears to be doing a Krall better possibly some additional granulation compared to my last evaluation. With that being said she still continues to have a lot of pain especially when her legs are elevated such as sitting in the chair here in the office. There does not appear to be any signs of infection which is good news. 05/28/18 on evaluation today patient appears to be  doing a Deskins better in regard to her right lateral lower [name]. Again she was supposed to have an appointment with vascular unfortunately they attempted to call her three times and unfortunately were unable to get anything scheduled. That reason closed up for him. She tells me that if the phone is answered and she doesn't recognize what is that she will hang up as well those that she lives with. Obviously she's not gonna recognize somebody calling from the office that she has not previously spoken with. Nonetheless the patient states that she does not remember any fall. I think if we're gonna get her in with vascular specialist we're gonna have to try to make the appointment for her. 06/04/2018 Seen today for follow up and management of right lateral lower extremity wound. Adherent slough present over surface of wound. Scheduled to follow up for ABI/TBI on 06/06/2018 with vein and vascular specialist. She is hard of hearing. Wound is sensitive to touch. No signs or symptoms of infection. Denies any recent fevers, chills, or SOB. 06/11/18 on evaluation today patient actually appears to be doing very well in regard to her right lateral lower extremity ulcer. She's been tolerating the dressing changes without complication. Fortunately there's no evidence of infection. With that being said she did have her arterial studies which revealed that she had a very low ABI which was 0.67 on the right with a TBI of 0.32 and 0.65 on the left with a TBI of 0.16. I  do believe she needs to see vascular and we are working on that referral as well. 06/21/18 on evaluation today patient's wound actually appears to be doing okay she does have an appointment with vascular coming up on the 24th. Nonetheless she's been having some issues that are more urinary in nature right now that are causing trouble for her. Fortunately there's no signs of severe sepsis although she is running a low-grade temperature 99.9  on evaluation today. She also is having some issues right now with chills as well as urinary urgency and frequency. Again I feel like the city is more urinary and symptomology I do not believe she has anything upper respiratory which is good news. Nonetheless I think this may need to be checked out more quickly especially in light of the fact that she's having significant back pain at this point. 07/02/18 on evaluation today patient appears to be doing okay in regard to her ulcer on the right lower extremity. She's been tolerating the dressing changes without complication. Fortunately there does not appear to be any signs of infection which is good news. No fevers, chills, nausea, or vomiting noted at this time. 07/10/18 on evaluation today patient appears to be doing well in regard to her right lateral lower extremity ulcer all things considering. She's been using this form this seems to be the one thing that she can really do on her own and she seems to be making some progress obviously help with this matter. I think the biggest issue limiting her feeling speed is her blood flow she canceled her original appointment with vascular her next one is not till May. She was worried about the Covid-19 Virus and having to see another physician. Nonetheless there's no signs of active infection at this time. 07/16/18 on evaluation today patient appears to be doing okay in regard to her right lateral lower extremity ulcer. She's been tolerating the dressing changes without complication. Fortunately there's no signs of active infection at this time. No fevers, chills, nausea, or vomiting noted at this time. 07/23/18 on evaluation today patient appears to be doing very well in regard to her right lateral load should be ulcer all things considering. She still again is not seeing vascular until mid May in the meantime she's been utilizing the over-the-counter tripling about appointment which seems to be the only  thing she's able to do routinely on her own without getting confused or frustrated. Overall it seems to be doing okay fortunately she's showing no signs of infection. 07/30/18 upon evaluation today patient actually appears to be doing about the same in regard to her wound in general. She is making very slow progress most likely again due to the issue with her blood flow she sees vascular towards the middle of news. In the meantime we've really been trying to just keep things in the control from the infection standpoint and allow for any healing that could take place up until appointment she can see vascular. Electronic Signature(s) Signed: 07/30/2018 5:07:11 PM By: Worthy Keeler PA-C Entered By: Worthy Keeler on 07/30/2018 15:33:16 [name] ([id number]) [name] ([id number]) -------------------------------------------------------------------------------- Physical Exam Details Patient Name: Hinckley, Nyimah C. Date of Service: 07/30/2018 2:30 PM Medical Record Number: [id number] Patient Account Number: [id number] Date of Birth/Sex: March 31, 1928 (85 y.o. F) Treating RN: Harold Barban Primary Care Provider: Ria Bush Other Clinician: Referring Provider: Ria Bush Treating Provider/Extender: STONE III, HOYT Weeks in Treatment: 25 Constitutional Well-nourished and well-hydrated in no acute distress.  Respiratory normal breathing without difficulty. clear to auscultation bilaterally. Cardiovascular regular rate and rhythm with normal S1, S2. Psychiatric this patient is able to make decisions and demonstrates good insight into disease process. Alert and Oriented x 3. pleasant and cooperative. Notes Patient's wound bed currently did not have any sharp debridement today and again we are avoiding is mainly due to blood flow issues. Fortunately she did not appear to be too swollen either which was also good news. Electronic Signature(s) Signed: 07/30/2018 5:07:11  PM By: Worthy Keeler PA-C Entered By: Worthy Keeler on 07/30/2018 15:33:51 [name] ([id number]) -------------------------------------------------------------------------------- Physician Orders Details Patient Name: [name], [name] Date of Service: 07/30/2018 2:30 PM Medical Record Number: [id number] Patient Account Number: [id number] Date of Birth/Sex: [birth year] (85 y.o. F) Treating RN: Harold Barban Primary Care Provider: Ria Bush Other Clinician: Referring Provider: Ria Bush Treating Provider/Extender: Melburn Hake, HOYT Weeks in Treatment: 18 Verbal / Phone Orders: No Diagnosis Coding ICD-10 Coding Code Description I87.331 Chronic venous hypertension (idiopathic) with ulcer and inflammation of right lower extremity E11.622 Type 2 diabetes mellitus with other skin ulcer L97.812 Non-pressure chronic ulcer of other part of right lower leg with fat layer exposed L03.115 Cellulitis of right lower limb Wound Cleansing Wound #8 Right,Lateral Lower Leg o Clean wound with Normal Saline. Anesthetic (add to Medication List) Wound #8 Right,Lateral Lower Leg o Topical Lidocaine 4% cream applied to wound bed prior to debridement (In Clinic Only). Primary Wound Dressing Wound #8 Right,Lateral Lower Leg o Other: - Antibiotic ointment Secondary Dressing Wound #8 Right,Lateral Lower Leg o ABD and Kerlix/Conform o Non-adherent pad Dressing Change Frequency Wound #8 Right,Lateral Lower Leg o Change dressing every day. Follow-up Appointments Wound #8 Right,Lateral Lower Leg o Return Appointment in 1 week. Edema Control Wound #8 Right,Lateral Lower Leg o Elevate legs to the level of the heart and pump ankles as often as possible o Other: - tubigrip Electronic Signature(s) Signed: 07/30/2018 4:10:42 PM By: Juliane Lack ([id number]) Signed: 07/30/2018 5:07:11 PM By: Worthy Keeler PA-C Entered By: Harold Barban on  07/30/2018 14:51:09 [name], [name] ([id number]) -------------------------------------------------------------------------------- Problem List Details Patient Name: [name], [name] Date of Service: 07/30/2018 2:30 PM Medical Record Number: [id number] Patient Account Number: [id number] Date of Birth/Sex: 07/25/1927 (85 y.o. F) Treating RN: Harold Barban Primary Care Provider: Ria Bush Other Clinician: Referring Provider: Ria Bush Treating Provider/Extender: Melburn Hake, HOYT Weeks in Treatment: 18 Active Problems ICD-10 Evaluated Encounter Code Description Active Date Today Diagnosis I87.331 Chronic venous hypertension (idiopathic) with ulcer and 03/23/2018 No Yes inflammation of right lower extremity E11.622 Type 2 diabetes mellitus with other skin ulcer 03/23/2018 No Yes L97.812 Non-pressure chronic ulcer of other part of right lower leg 03/23/2018 No Yes with fat layer exposed L03.115 Cellulitis of right lower limb 03/23/2018 No Yes Inactive Problems Resolved Problems Electronic Signature(s) Signed: 07/30/2018 5:07:11 PM By: Worthy Keeler PA-C Entered By: Worthy Keeler on 07/30/2018 14:47:01 [name] ([id number]) -------------------------------------------------------------------------------- Progress Note Details Patient Name: [name], [name] Date of Service: 07/30/2018 2:30 PM Medical Record Number: [id number] Patient Account Number: [id number] Date of Birth/Sex: 09-22-1927 (85 y.o. F) Treating RN: Harold Barban Primary Care Provider: Ria Bush Other Clinician: Referring Provider: Ria Bush Treating Provider/Extender: Melburn Hake, HOYT Weeks in Treatment: 18 Subjective Chief Complaint Information obtained from Patient RLE wounds History of Present Illness (HPI) Readmission: 03/23/18 on evaluation today patient presents for readmission entire clinic concerning issues that she has been having for the past  couple of weeks  in regard to her right lower extremity. The medial injury actually occurred when her dog tried to take a blanket from her and subsequently scratched her with his toenail. Subsequently the lateral injury was a wound where she dropped a knife in her kitchen and hit the side of her leg. With that being said both have been open for some time and home health nurse that has been coming out marked for her where the redness was several days ago. Nonetheless this has spread will be on the margin of what was marked. She was placed on Keflex which was started two days ago but at this point it does not seem to have helped with any improvement in the overall appearance of the wounds. No fevers, chills, nausea, or vomiting noted at this time. 04/02/18 Seen today for follow up and management of right lower extremity wounds. Right lateral wound with adherent slough. She is complaining of pain to touch which limited exam. Recently treated in the ED for cellulitis and wound culture positive for MRSA. Completed a dose of Vancomycin and transitioned to doxycycline by mouth. Erythema remains present with improvement since last visit. Right medial wound with scab present. No fevers, chills, nausea, or vomiting noted at this time. 04/10/18 on evaluation today patient appears to be doing a Beedy better in regard to her right lateral lower [name]. The slough does seem to be coming off although it is slow from the wound bed. Fortunately there does not appear to be any evidence of infection at this time which is good news. With that being said I do believe that she is going to require longer treatment with the Santyl simply due to the fact that I'm not able to really debride the wound due to the pain that she is experiencing. 04/16/18 upon evaluation today patient's wound bed currently on the lateral lower extremity on the right appears to be doing a Shor better at this point. We are utilizing sensible currently.  Overall I'm very pleased with how things are progressing although there still is some work to do before this will be completely healed. No fevers, chills, nausea, or vomiting noted at this time. 04/23/18 on evaluation today patient appears to be doing about the same at this point maybe just a Oconnor bit smaller than last week's evaluation. She continues to have some slough buildup on the surface of the wound and has very Vallier granulation unfortunately. This seems to be very slow moving I'm getting somewhat concerned about the possibility of her having decreased blood flow which is leading to her subsequently not being able to heal this area as quickly as she shut otherwise. She is currently utilizing Santyl. 05/01/18 on evaluation today patient appears to be doing somewhat poorly in regard to her lower extremity ulcer. At this point she's been tolerating the dressing changes to some degree without complication. With that being said she does note of the past several days that she has been having more pain unfortunately. On inspection it appears that she may have signs of infection which would account for the pain. 05/07/18 on evaluation today patient's wound actually appears to be doing better she's not having as much erythema in the right lower extremity as compared to last week. Fortunately there's no signs of infection this is good news as well. Overall very pleased with the progress that she has made up to this point. [name] ([id number]) 05/14/18 on evaluation today patient actually appears to  be doing rather well in regard to her right lower extremity ulcer. She does not have erythema that was characterizing the cellulitis prior to initiation of antibiotic therapy. With that being said her wound still appears to be somewhat slow in healing I believe this may be arterial in nature that is my concern at least. With that being said I do want her to see the vascular specialist as soon as  possible we been trying to get this set up I'm not sure if she's not answering the phone or what exactly is going on here. 05/21/18 on evaluation today patient's wound actually appears to be doing a Palmero better possibly some additional granulation compared to my last evaluation. With that being said she still continues to have a lot of pain especially when her legs are elevated such as sitting in the chair here in the office. There does not appear to be any signs of infection which is good news. 05/28/18 on evaluation today patient appears to be doing a Spraker better in regard to her right lateral lower [name]. Again she was supposed to have an appointment with vascular unfortunately they attempted to call her three times and unfortunately were unable to get anything scheduled. That reason closed up for him. She tells me that if the phone is answered and she doesn't recognize what is that she will hang up as well those that she lives with. Obviously she's not gonna recognize somebody calling from the office that she has not previously spoken with. Nonetheless the patient states that she does not remember any fall. I think if we're gonna get her in with vascular specialist we're gonna have to try to make the appointment for her. 06/04/2018 Seen today for follow up and management of right lateral lower extremity wound. Adherent slough present over surface of wound. Scheduled to follow up for ABI/TBI on 06/06/2018 with vein and vascular specialist. She is hard of hearing. Wound is sensitive to touch. No signs or symptoms of infection. Denies any recent fevers, chills, or SOB. 06/11/18 on evaluation today patient actually appears to be doing very well in regard to her right lateral lower extremity ulcer. She's been tolerating the dressing changes without complication. Fortunately there's no evidence of infection. With that being said she did have her arterial studies which revealed that she had a very  low ABI which was 0.67 on the right with a TBI of 0.32 and 0.65 on the left with a TBI of 0.16. I do believe she needs to see vascular and we are working on that referral as well. 06/21/18 on evaluation today patient's wound actually appears to be doing okay she does have an appointment with vascular coming up on the 24th. Nonetheless she's been having some issues that are more urinary in nature right now that are causing trouble for her. Fortunately there's no signs of severe sepsis although she is running a low-grade temperature 99.9 on evaluation today. She also is having some issues right now with chills as well as urinary urgency and frequency. Again I feel like the city is more urinary and symptomology I do not believe she has anything upper respiratory which is good news. Nonetheless I think this may need to be checked out more quickly especially in light of the fact that she's having significant back pain at this point. 07/02/18 on evaluation today patient appears to be doing okay in regard to her ulcer on the right lower extremity. She's been tolerating the dressing changes  without complication. Fortunately there does not appear to be any signs of infection which is good news. No fevers, chills, nausea, or vomiting noted at this time. 07/10/18 on evaluation today patient appears to be doing well in regard to her right lateral lower extremity ulcer all things considering. She's been using this form this seems to be the one thing that she can really do on her own and she seems to be making some progress obviously help with this matter. I think the biggest issue limiting her feeling speed is her blood flow she canceled her original appointment with vascular her next one is not till May. She was worried about the Covid-19 Virus and having to see another physician. Nonetheless there's no signs of active infection at this time. 07/16/18 on evaluation today patient appears to be doing okay in regard  to her right lateral lower extremity ulcer. She's been tolerating the dressing changes without complication. Fortunately there's no signs of active infection at this time. No fevers, chills, nausea, or vomiting noted at this time. 07/23/18 on evaluation today patient appears to be doing very well in regard to her right lateral load should be ulcer all things considering. She still again is not seeing vascular until mid May in the meantime she's been utilizing the over-the-counter tripling about appointment which seems to be the only thing she's able to do routinely on her own without getting confused or frustrated. Overall it seems to be doing okay fortunately she's showing no signs of infection. 07/30/18 upon evaluation today patient actually appears to be doing about the same in regard to her wound in general. She is making very slow progress most likely again due to the issue with her blood flow she sees vascular towards the middle of news. In the meantime we've really been trying to just keep things in the control from the infection standpoint and allow for any healing that could take place up until appointment she can see vascular. MACKINZEE, ROSZAK ([id number]) Patient History Information obtained from Patient. Family History Diabetes - Father,Siblings,Child, Hypertension - Child, Lung Disease - Child, Stroke - Mother, No family history of Cancer, Heart Disease, Kidney Disease, Seizures, Thyroid Problems. Social History Former smoker, Marital Status - Single, Alcohol Use - Never, Drug Use - No History, Caffeine Use - Daily. Medical History Eyes Patient has history of Cataracts - removed Denies history of Glaucoma, Optic Neuritis Hematologic/Lymphatic Patient has history of Anemia Denies history of Hemophilia, Human Immunodeficiency Virus, Lymphedema, Sickle Cell Disease Respiratory Denies history of Aspiration, Asthma, Chronic Obstructive Pulmonary Disease (COPD), Pneumothorax, Sleep  Apnea, Tuberculosis Cardiovascular Patient has history of Arrhythmia, Coronary Artery Disease, Hypertension, Myocardial Infarction Denies history of Angina, Congestive Heart Failure, Deep Vein Thrombosis, Hypotension, Peripheral Arterial Disease, Peripheral Venous Disease, Phlebitis Endocrine Patient has history of Type II Diabetes Denies history of Type I Diabetes Genitourinary Denies history of End Stage Renal Disease Immunological Denies history of Lupus Erythematosus, Raynaud s, Scleroderma Integumentary (Skin) Denies history of History of Burn, History of pressure wounds Musculoskeletal Patient has history of Osteoarthritis Denies history of Gout, Rheumatoid Arthritis, Osteomyelitis Neurologic Denies history of Dementia, Neuropathy, Quadriplegia, Paraplegia, Seizure Disorder Oncologic Denies history of Received Chemotherapy, Received Radiation Medical And Surgical History Notes Constitutional Symptoms (General Health) High Blood Pressure, open heart surgery, gall stone, hysterectomy; Fluid pill, Anxiety Review of Systems (ROS) Constitutional Symptoms (General Health) Denies complaints or symptoms of Fatigue, Fever, Chills, Marked Weight Change. Respiratory Denies complaints or symptoms of Chronic or frequent coughs, Shortness  of Breath. Cardiovascular Denies complaints or symptoms of Chest pain, LE edema. Psychiatric Denies complaints or symptoms of Anxiety, Claustrophobia. Brendlinger, Charie C. ([id number]) Objective Constitutional Well-nourished and well-hydrated in no acute distress. Vitals Time Taken: 2:32 PM, Height: 58 in, Weight: 140 lbs, BMI: 29.3, Temperature: 98.2 F, Pulse: 68 bpm, Respiratory Rate: 16 breaths/min, Blood Pressure: 137/67 mmHg. Respiratory normal breathing without difficulty. clear to auscultation bilaterally. Cardiovascular regular rate and rhythm with normal S1, S2. Psychiatric this patient is able to make decisions and demonstrates good  insight into disease process. Alert and Oriented x 3. pleasant and cooperative. General Notes: Patient's wound bed currently did not have any sharp debridement today and again we are avoiding is mainly due to blood flow issues. Fortunately she did not appear to be too swollen either which was also good news. Integumentary (Hair, Skin) Wound #8 status is Open. Original cause of wound was Trauma. The wound is located on the Right,Lateral Lower Leg. The wound measures 2.8cm length x 1cm width x 0.1cm depth; 2.199cm^2 area and 0.22cm^3 volume. There is Fat Layer (Subcutaneous Tissue) Exposed exposed. There is no tunneling or undermining noted. There is a medium amount of purulent drainage noted. The wound margin is flat and intact. There is no granulation within the wound bed. There is a large (67-100%) amount of necrotic tissue within the wound bed including Adherent Slough. The periwound skin appearance did not exhibit: Callus, Crepitus, Excoriation, Induration, Rash, Scarring, Dry/Scaly, Maceration, Atrophie Blanche, Cyanosis, Ecchymosis, Hemosiderin Staining, Mottled, Pallor, Rubor, Erythema. Periwound temperature was noted as No Abnormality. The periwound has tenderness on palpation. Assessment Active Problems ICD-10 Chronic venous hypertension (idiopathic) with ulcer and inflammation of right lower extremity Type 2 diabetes mellitus with other skin ulcer Non-pressure chronic ulcer of other part of right lower leg with fat layer exposed Cellulitis of right lower limb Plan Benish, Caelin C. ([id number]) Wound Cleansing: Wound #8 Right,Lateral Lower Leg: Clean wound with Normal Saline. Anesthetic (add to Medication List): Wound #8 Right,Lateral Lower Leg: Topical Lidocaine 4% cream applied to wound bed prior to debridement (In Clinic Only). Primary Wound Dressing: Wound #8 Right,Lateral Lower Leg: Other: - Antibiotic ointment Secondary Dressing: Wound #8 Right,Lateral Lower Leg: ABD  and Kerlix/Conform Non-adherent pad Dressing Change Frequency: Wound #8 Right,Lateral Lower Leg: Change dressing every day. Follow-up Appointments: Wound #8 Right,Lateral Lower Leg: Return Appointment in 1 week. Edema Control: Wound #8 Right,Lateral Lower Leg: Elevate legs to the level of the heart and pump ankles as often as possible Other: - tubigrip My suggestion at this point is gonna be that we go ahead and continue with the above and her measures for the next week. The patient is in agreement with plan. If anything changes or worsens meantime shall contact the office let me know. Please see above for specific wound care orders. We will see patient for re-evaluation in 1 week(s) here in the clinic. If anything worsens or changes patient will contact our office for additional recommendations. Electronic Signature(s) Signed: 07/30/2018 5:07:11 PM By: Worthy Keeler PA-C Entered By: Worthy Keeler on 07/30/2018 15:34:04 Quint, [name] ([id number]) -------------------------------------------------------------------------------- ROS/PFSH Details Patient Name: Schertzer, Pheonix C. Date of Service: 07/30/2018 2:30 PM Medical Record Number: [id number] Patient Account Number: [id number] Date of Birth/Sex: 09/25/1927 (85 y.o. F) Treating RN: Harold Barban Primary Care Provider: Ria Bush Other Clinician: Referring Provider: Ria Bush Treating Provider/Extender: Melburn Hake, HOYT Weeks in Treatment: 18 Information Obtained From Patient Constitutional Symptoms (General Health) Complaints and Symptoms: Negative for: Fatigue;  Fever; Chills; Marked Weight Change Medical History: Past Medical History Notes: High Blood Pressure, open heart surgery, gall stone, hysterectomy; Fluid pill, Anxiety Respiratory Complaints and Symptoms: Negative for: Chronic or frequent coughs; Shortness of Breath Medical History: Negative for: Aspiration; Asthma; Chronic Obstructive Pulmonary  Disease (COPD); Pneumothorax; Sleep Apnea; Tuberculosis Cardiovascular Complaints and Symptoms: Negative for: Chest pain; LE edema Medical History: Positive for: Arrhythmia; Coronary Artery Disease; Hypertension; Myocardial Infarction Negative for: Angina; Congestive Heart Failure; Deep Vein Thrombosis; Hypotension; Peripheral Arterial Disease; Peripheral Venous Disease; Phlebitis Psychiatric Complaints and Symptoms: Negative for: Anxiety; Claustrophobia Eyes Medical History: Positive for: Cataracts - removed Negative for: Glaucoma; Optic Neuritis Hematologic/Lymphatic Medical History: Positive for: Anemia Negative for: Hemophilia; Human Immunodeficiency Virus; Lymphedema; Sickle Cell Disease Endocrine LEATHER, ESTIS ([id number]) Medical History: Positive for: Type II Diabetes Negative for: Type I Diabetes Time with diabetes: no idea Treated with: Oral agents Blood sugar tested every day: No Genitourinary Medical History: Negative for: End Stage Renal Disease Immunological Medical History: Negative for: Lupus Erythematosus; Raynaudos; Scleroderma Integumentary (Skin) Medical History: Negative for: History of Burn; History of pressure wounds Musculoskeletal Medical History: Positive for: Osteoarthritis Negative for: Gout; Rheumatoid Arthritis; Osteomyelitis Neurologic Medical History: Negative for: Dementia; Neuropathy; Quadriplegia; Paraplegia; Seizure Disorder Oncologic Medical History: Negative for: Received Chemotherapy; Received Radiation HBO Extended History Items Eyes: Cataracts Immunizations Pneumococcal Vaccine: Received Pneumococcal Vaccination: Yes Implantable Devices No devices added Family and Social History Cancer: No; Diabetes: Yes - Father,Siblings,Child; Heart Disease: No; Hypertension: Yes - Child; Kidney Disease: No; Lung Disease: Yes - Child; Seizures: No; Stroke: Yes - Mother; Thyroid Problems: No; Former smoker; Marital Status -  Single; Alcohol Use: Never; Drug Use: No History; Caffeine Use: Daily; Financial Concerns: No; Food, Clothing or Shelter Needs: No; Support System Lacking: No; Transportation Concerns: No Physician Affirmation I have reviewed and agree with the above information. LANELLE, LINDO ([id number]) Electronic Signature(s) Signed: 07/30/2018 4:10:42 PM By: Harold Barban Signed: 07/30/2018 5:07:11 PM By: Worthy Keeler PA-C Entered By: Worthy Keeler on 07/30/2018 15:33:39 Blumstein, [name] ([id number]) -------------------------------------------------------------------------------- SuperBill Details Patient Name: Cashaw, Jandy C. Date of Service: 07/30/2018 Medical Record Number: [id number] Patient Account Number: [id number] Date of Birth/Sex: Dec 07, 1927 (85 y.o. F) Treating RN: Harold Barban Primary Care Provider: Ria Bush Other Clinician: Referring Provider: Ria Bush Treating Provider/Extender: Melburn Hake, HOYT Weeks in Treatment: 18 Diagnosis Coding ICD-10 Codes Code Description I87.331 Chronic venous hypertension (idiopathic) with ulcer and inflammation of right lower extremity E11.622 Type 2 diabetes mellitus with other skin ulcer L97.812 Non-pressure chronic ulcer of other part of right lower leg with fat layer exposed L03.115 Cellulitis of right lower limb Facility Procedures CPT4 Code: 06004599 Description: 77414 - WOUND CARE VISIT-LEV 2 EST PT Modifier: Quantity: 1 Physician Procedures CPT4: Description Modifier Quantity Code [id number] 99214 - WC PHYS LEVEL 4 - EST PT 1 ICD-10 Diagnosis Description I87.331 Chronic venous hypertension (idiopathic) with ulcer and inflammation of right lower extremity E11.622 Type 2 diabetes mellitus with  other skin ulcer L97.812 Non-pressure chronic ulcer of other part of right lower leg with fat layer exposed L03.115 Cellulitis of right lower limb Electronic Signature(s) Signed: 07/30/2018 5:07:11 PM By: Worthy Keeler  PA-C Entered By: Worthy Keeler on 07/30/2018 15:34:20

## 2018-07-30 NOTE — Progress Notes (Signed)
[name], [name] ([id number]) Visit Report for 07/30/2018 Arrival Information Details Patient Name: [name], [name] Date of Service: 07/30/2018 2:30 PM Medical Record Number: [id number] Patient Account Number: [id number] Date of Birth/Sex: Nov 12, 1927 (85 y.o. F) Treating RN: [name] Primary Care [name]: [name] Other Clinician: Referring [name]: [name] Treating [name]/Extender: [name], [name]: 18 Visit Information History Since Last Visit Added or deleted any medications: No Patient Arrived: Walker Any new allergies or adverse reactions: No Arrival Time: 14:31 Had a fall or experienced change in No Accompanied By: self activities of daily living that may affect Transfer Assistance: None risk of falls: Patient Identification Verified: Yes Signs or symptoms of abuse/neglect since last visito No Secondary Verification Process Completed: Yes Hospitalized since last visit: No Implantable device outside of the clinic excluding No cellular tissue based products placed in the center since last visit: Has Dressing in Place as Prescribed: Yes Pain Present Now: Yes Electronic Signature(s) Signed: 07/30/2018 3:31:41 PM By: [name] [name] Entered By: [name] on 07/30/2018 14:32:25 [name], [name] ([id number]) -------------------------------------------------------------------------------- Clinic Level of Care Assessment Details Patient Name: [name], [name] Date of Service: 07/30/2018 2:30 PM Medical Record Number: [id number] Patient Account Number: [id number] Date of Birth/Sex: 11/08/1927 (85 y.o. F) Treating RN: [name] Primary Care Xian Alves: [name] Other Clinician: Referring [name]: [name] Treating Romaine [name]/Extender: [name], [name]: 18 Clinic Level of Care Assessment Items TOOL 4 Quantity Score []  - Use when only an EandM is  performed on FOLLOW-UP visit 0 ASSESSMENTS - Nursing Assessment / Reassessment X - Reassessment of Co-morbidities (includes updates in patient status) 1 10 X- 1 5 Reassessment of Adherence to [name] Plan ASSESSMENTS - Wound and Skin Assessment / Reassessment X - Simple Wound Assessment / Reassessment - one wound 1 5 []  - 0 Complex Wound Assessment / Reassessment - multiple wounds []  - 0 Dermatologic / Skin Assessment (not related to wound area) ASSESSMENTS - Focused Assessment []  - Circumferential Edema Measurements - multi extremities 0 []  - 0 Nutritional Assessment / Counseling / Intervention []  - 0 Lower Extremity Assessment (monofilament, tuning fork, pulses) []  - 0 Peripheral Arterial Disease Assessment (using hand held doppler) ASSESSMENTS - Ostomy and/or Continence Assessment and Care []  - Incontinence Assessment and Management 0 []  - 0 Ostomy Care Assessment and Management (repouching, etc.) PROCESS - Coordination of Care X - Simple Patient / Family Education for ongoing care 1 15 []  - 0 Complex (extensive) Patient / Family Education for ongoing care []  - 0 [name] obtains Programmer, systems, Records, Test Results / Process Orders []  - 0 [name] telephones HHA, Nursing Homes / Clarify orders / etc []  - 0 Routine Transfer to another Facility (non-emergent condition) []  - 0 Routine Hospital Admission (non-emergent condition) []  - 0 New Admissions / Biomedical engineer / Ordering NPWT, Apligraf, etc. []  - 0 Emergency Hospital Admission (emergent condition) X- 1 10 Simple Discharge Coordination [name], [name] []  - 0 Complex (extensive) Discharge Coordination PROCESS - Special Needs []  - Pediatric / Minor Patient Management 0 []  - 0 Isolation Patient Management []  - 0 Hearing / Language / Visual special needs []  - 0 Assessment of Community assistance (transportation, D/C planning, etc.) []  - 0 Additional assistance / Altered mentation []  - 0 Support  Surface(s) Assessment (bed, cushion, seat, etc.) INTERVENTIONS - Wound Cleansing / Measurement X - Simple Wound Cleansing - one wound 1 5 []  - 0 Complex Wound Cleansing - multiple wounds  X- 1 5 Wound Imaging (photographs - any number of wounds) []  - 0 Wound Tracing (instead of photographs) X- 1 5 Simple Wound Measurement - one wound []  - 0 Complex Wound Measurement - multiple wounds INTERVENTIONS - Wound Dressings X - Small Wound Dressing one or multiple wounds 1 10 []  - 0 Medium Wound Dressing one or multiple wounds []  - 0 Large Wound Dressing one or multiple wounds []  - 0 Application of Medications - topical []  - 0 Application of Medications - injection INTERVENTIONS - Miscellaneous []  - External ear exam 0 []  - 0 Specimen Collection (cultures, biopsies, blood, body fluids, etc.) []  - 0 Specimen(s) / Culture(s) sent or taken to Lab for analysis []  - 0 Patient Transfer (multiple [name] / [name] / Similar devices) []  - 0 Simple Staple / Suture removal (25 or less) []  - 0 Complex Staple / Suture removal (26 or more) []  - 0 Hypo / Hyperglycemic Management (close monitor of Blood Glucose) []  - 0 Ankle / Brachial Index (ABI) - do not check if billed separately X- 1 5 Vital Signs [name], [name] Has the patient been seen at the hospital within the last three years: Yes Total Score: 75 Level Of Care: New/Established - Level 2 Electronic Signature(s) Signed: 07/30/2018 4:10:42 PM By: [name] Entered By: [name] on 07/30/2018 14:50:19 [name] -------------------------------------------------------------------------------- Encounter Discharge Information Details Patient Name: [name], [name] Date of Service: 07/30/2018 2:30 PM Medical Record Number: [id number] Patient Account Number: [id number] Date of Birth/Sex: 1928/01/13 (85 y.o. F) Treating RN: [name] Primary Care [name]: [name] Other  Clinician: Referring [name]: [name] Treating [name]/Extender: [name], [name]: 18 Encounter Discharge Information Items Discharge Condition: Stable Ambulatory Status: Walker Discharge Destination: Home Transportation: Private Auto Accompanied By: self Schedule Follow-up Appointment: Yes Clinical Summary of Care: Electronic Signature(s) Signed: 07/30/2018 4:10:42 PM By: [name] Entered By: [name] on 07/30/2018 15:01:56 Kellen, Saveah C. ([id number]) -------------------------------------------------------------------------------- Lower Extremity Assessment Details Patient Name: [name], [name] Date of Service: 07/30/2018 2:30 PM Medical Record Number: [id number] Patient Account Number: [id number] Date of Birth/Sex: [birth year] (85 y.o. F) Treating RN: Montey Hora Primary Care Drina Jobst: [name] Other Clinician: Referring Ethelwyn Gilbertson: [name] Treating Afia Messenger/Extender: STONE III, [name]: 18 Edema Assessment Assessed: [Left: No] [Right: No] Edema: [Left: N] [Right: o] Calf Left: Right: Point of Measurement: 30 cm From Medial Instep cm 29 cm Ankle Left: Right: Point of Measurement: 12 cm From Medial Instep cm 19 cm Vascular Assessment Pulses: Dorsalis Pedis Palpable: [Right:Yes] Electronic Signature(s) Signed: 07/30/2018 4:13:42 PM By: Montey Hora Entered By: Montey Hora on 07/30/2018 14:38:04 Matsushita, Lillybeth CMarland Kitchen ([id number]) -------------------------------------------------------------------------------- Multi Wound Chart Details Patient Name: [name], [name] Date of Service: 07/30/2018 2:30 PM Medical Record Number: [id number] Patient Account Number: [id number] Date of Birth/Sex: [birth year] (85 y.o. F) Treating RN: [name] Primary Care Zakariyah Freimark: [name] Other Clinician: Referring Erskin Zinda: [name] Treating Joyous Gleghorn/Extender: [name], [name] Weeks in  [name]: 18 Vital Signs Height(in): 58 Pulse(bpm): 75 Weight(lbs): 140 Blood Pressure(mmHg): 137/67 Body Mass Index(BMI): 29 Temperature(F): 98.2 Respiratory Rate 16 (breaths/min): Photos: [N/A:N/A] Wound Location: Right Lower Leg - Lateral N/A N/A Wounding Event: Trauma N/A N/A Primary Etiology: Diabetic Wound/Ulcer of the N/A N/A Lower Extremity Comorbid History: Cataracts, Anemia, N/A N/A Arrhythmia, Coronary Artery Disease, Hypertension, Myocardial Infarction, Type II Diabetes, Osteoarthritis Date Acquired: 03/09/2018 N/A N/A Weeks of [name]: 18 N/A N/A Wound Status: Open N/A N/A Measurements L x  W x D 2.8x1x0.1 N/A N/A (cm) Area (cm) : 2.199 N/A N/A Volume (cm) : 0.22 N/A N/A % Reduction in Area: -16.70% N/A N/A % Reduction in Volume: 61.10% N/A N/A Classification: Grade 2 N/A N/A Exudate Amount: Medium N/A N/A Exudate Type: Purulent N/A N/A Exudate Color: yellow, brown, green N/A N/A Wound Margin: Flat and Intact N/A N/A Granulation Amount: None Present (0%) N/A N/A Necrotic Amount: Large (67-100%) N/A N/A Exposed Structures: Fat Layer (Subcutaneous N/A N/A Tissue) Exposed: Yes Fascia: No Tendon: No Kostelnik, Jakerria C. ([id number]) Muscle: No Joint: No Bone: No Epithelialization: None N/A N/A Periwound Skin Texture: Excoriation: No N/A N/A Induration: No Callus: No Crepitus: No Rash: No Scarring: No Periwound Skin Moisture: Maceration: No N/A N/A Dry/Scaly: No Periwound Skin Color: Atrophie Blanche: No N/A N/A Cyanosis: No Ecchymosis: No Erythema: No Hemosiderin Staining: No Mottled: No Pallor: No Rubor: No Temperature: No Abnormality N/A N/A Tenderness on Palpation: Yes N/A N/A [name] Notes Electronic Signature(s) Signed: 07/30/2018 4:10:42 PM By: [name] Entered By: [name] on 07/30/2018 14:49:22 Getman, [name]  ([id number]) -------------------------------------------------------------------------------- [place] Details Patient Name: [name], [name] Date of Service: 07/30/2018 2:30 PM Medical Record Number: [id number] Patient Account Number: [id number] Date of Birth/Sex: 28-Jan-1928 (85 y.o. F) Treating RN: [name] Primary Care Quanisha Drewry: [name] Other Clinician: Referring Johnhenry Tippin: [name] Treating Lavarr President/Extender: [name], [name]: 18 Active Inactive Abuse / Safety / Falls / Self Care Management Nursing Diagnoses: Potential for falls Goals: Patient will not experience any injury related to falls Date Initiated: 03/23/2018 Target Resolution Date: 06/09/2018 Goal Status: Active Interventions: Assess fall risk on admission and as needed Notes: Orientation to the Wound Care Program Nursing Diagnoses: Knowledge deficit related to the wound healing center program Goals: Patient/caregiver will verbalize understanding of the [place] Program Date Initiated: 03/23/2018 Target Resolution Date: 06/09/2018 Goal Status: Active Interventions: Provide education on orientation to the wound center Notes: Pain, Acute or Chronic Nursing Diagnoses: Pain, acute or chronic: actual or potential Goals: Patient will verbalize adequate pain control and receive pain control interventions during procedures as needed Date Initiated: 03/23/2018 Target Resolution Date: 06/09/2018 Goal Status: Active Interventions: Assess comfort goal upon admission [name], [name] ([id number]) Notes: Wound/Skin Impairment Nursing Diagnoses: Impaired tissue integrity Goals: Ulcer/skin breakdown will heal within 14 weeks Date Initiated: 03/23/2018 Target Resolution Date: 06/09/2018 Goal Status: Active Interventions: Assess patient/caregiver ability to obtain necessary supplies Assess patient/caregiver ability to perform ulcer/skin care regimen  upon admission and as needed Assess ulceration(s) every visit Notes: Electronic Signature(s) Signed: 07/30/2018 4:10:42 PM By: [name] Entered By: [name] on 07/30/2018 Cozad, [name] ([id number]) -------------------------------------------------------------------------------- Pain Assessment Details Patient Name: [name], [name] Date of Service: 07/30/2018 2:30 PM Medical Record Number: [id number] Patient Account Number: [id number] Date of Birth/Sex: Jan 25, 1928 (85 y.o. F) Treating RN: [name] Primary Care Taylor Levick: [name] Other Clinician: Referring Torrie Namba: [name] Treating Gretchen Weinfeld/Extender: [name], [name]: 18 Active Problems Location of Pain Severity and Description of Pain Patient Has [name] Yes Site Locations Rate the pain. Current Pain Level: 5 Pain Management and Medication Current Pain Management: Electronic Signature(s) Signed: 07/30/2018 3:31:41 PM By: [name] [name] Signed: 07/30/2018 4:10:42 PM By: [name] Entered By: [name] on 07/30/2018 14:32:36 Mathenia, [name] ([id number]) -------------------------------------------------------------------------------- Patient/Caregiver Education Details Patient Name: [name], [name] Date of Service: 07/30/2018 2:30 PM Medical Record Number: [id number] Patient Account Number: [id number] Date of Birth/Gender: [birth year] (  85 y.o. F) Treating RN: [name] Primary Care Physician: [name] Other Clinician: Referring Physician: Ria [name] Treating Physician/Extender: Sharalyn Ink in [name]: 18 Education Assessment Education Provided To: Patient Education Topics Provided Wound/Skin Impairment: Handouts: Caring for Your Ulcer Methods: Demonstration, Explain/Verbal Responses: State content correctly Electronic Signature(s) Signed: 07/30/2018 4:10:42 PM By: [name] Entered By: [name] on 07/30/2018 14:49:40 [name], [name] C. ([id number]) -------------------------------------------------------------------------------- Wound Assessment Details Patient Name: Feltus, Natassia C. Date of Service: 07/30/2018 2:30 PM Medical Record Number: [id number] Patient Account Number: [id number] Date of Birth/Sex: 1927-12-09 (85 y.o. F) Treating RN: Montey Hora Primary Care Paradise Vensel: [name] Other Clinician: Referring Elene Downum: [name] Treating Jonus Coble/Extender: STONE III, [name]: 18 Wound Status Wound Number: 8 Primary Diabetic Wound/Ulcer of the Lower Extremity Etiology: Wound Location: Right Lower Leg - Lateral Wound Open Wounding Event: Trauma Status: Date Acquired: 03/09/2018 Comorbid Cataracts, Anemia, Arrhythmia, Coronary Artery Weeks Of [name]: 18 History: Disease, Hypertension, Myocardial Infarction, Clustered Wound: No Type II Diabetes, Osteoarthritis Photos Wound Measurements Length: (cm) 2.8 % Reduction in Width: (cm) 1 % Reduction in Depth: (cm) 0.1 Epithelializat Area: (cm) 2.199 Tunneling: Volume: (cm) 0.22 Undermining: Area: -16.7% Volume: 61.1% ion: None No No Wound Description Classification: Grade 2 Foul Odor Afte Wound Margin: Flat and Intact Slough/Fibrino Exudate Amount: Medium Exudate Type: Purulent Exudate Color: yellow, brown, green r Cleansing: No Yes Wound Bed Granulation Amount: None Present (0%) Exposed Structure Necrotic Amount: Large (67-100%) Fascia Exposed: No Necrotic Quality: Adherent Slough Fat Layer (Subcutaneous Tissue) Exposed: Yes Tendon Exposed: No Muscle Exposed: No Joint Exposed: No Bone Exposed: No Periwound Skin Texture Texture Color Willems, Camiah C. ([id number]) No Abnormalities Noted: No No Abnormalities Noted: No Callus: No Atrophie Blanche: No Crepitus: No Cyanosis: No Excoriation: No Ecchymosis: No Induration:  No Erythema: No Rash: No Hemosiderin Staining: No Scarring: No Mottled: No Pallor: No Moisture Rubor: No No Abnormalities Noted: No Dry / Scaly: No Temperature / Pain Maceration: No Temperature: No Abnormality Tenderness on Palpation: Yes [name] Notes Wound #8 (Right, Lateral Lower Leg) Notes TCA, telfa, conform secured with tape. Patient refused Tubigrip-states she will not wear because she does not like it! Electronic Signature(s) Signed: 07/30/2018 4:13:42 PM By: Montey Hora Entered By: Montey Hora on 07/30/2018 14:37:37 Pinkney, [name] ([id number]) -------------------------------------------------------------------------------- Vitals Details Patient Name: Whichard, Demeka C. Date of Service: 07/30/2018 2:30 PM Medical Record Number: [id number] Patient Account Number: [id number] Date of Birth/Sex: May 24, 1927 (85 y.o. F) Treating RN: [name] Primary Care Livvy Spilman: [name] Other Clinician: Referring Kashmere Staffa: [name] Treating Kalenna Millett/Extender: [name], [name]: 18 Vital Signs Time Taken: 14:32 Temperature (F): 98.2 Height (in): 58 Pulse (bpm): 68 Weight (lbs): 140 Respiratory Rate (breaths/min): 16 Body Mass Index (BMI): 29.3 Blood Pressure (mmHg): 137/67 Reference Range: 80 - 120 mg / dl Electronic Signature(s) Signed: 07/30/2018 3:31:41 PM By: [name] [name] Entered By: [name] on 07/30/2018 14:35:42

## 2018-08-06 ENCOUNTER — Other Ambulatory Visit: Payer: Self-pay

## 2018-08-06 ENCOUNTER — Encounter: Payer: Medicare Other | Attending: Physician Assistant | Admitting: Physician Assistant

## 2018-08-06 DIAGNOSIS — L97812 Non-pressure chronic ulcer of other part of right lower leg with fat layer exposed: Secondary | ICD-10-CM | POA: Diagnosis not present

## 2018-08-06 DIAGNOSIS — I2510 Atherosclerotic heart disease of native coronary artery without angina pectoris: Secondary | ICD-10-CM | POA: Diagnosis not present

## 2018-08-06 DIAGNOSIS — Z7984 Long term (current) use of oral hypoglycemic drugs: Secondary | ICD-10-CM | POA: Insufficient documentation

## 2018-08-06 DIAGNOSIS — L03115 Cellulitis of right lower limb: Secondary | ICD-10-CM | POA: Diagnosis not present

## 2018-08-06 DIAGNOSIS — I252 Old myocardial infarction: Secondary | ICD-10-CM | POA: Diagnosis not present

## 2018-08-06 DIAGNOSIS — Z87891 Personal history of nicotine dependence: Secondary | ICD-10-CM | POA: Diagnosis not present

## 2018-08-06 DIAGNOSIS — E11622 Type 2 diabetes mellitus with other skin ulcer: Secondary | ICD-10-CM | POA: Insufficient documentation

## 2018-08-06 DIAGNOSIS — M1990 Unspecified osteoarthritis, unspecified site: Secondary | ICD-10-CM | POA: Diagnosis not present

## 2018-08-06 DIAGNOSIS — I10 Essential (primary) hypertension: Secondary | ICD-10-CM | POA: Insufficient documentation

## 2018-08-06 DIAGNOSIS — Z8249 Family history of ischemic heart disease and other diseases of the circulatory system: Secondary | ICD-10-CM | POA: Diagnosis not present

## 2018-08-09 NOTE — Progress Notes (Signed)
SAMINA, WEEKES ([id number]) Visit Report for 08/06/2018 Arrival Information Details Patient Name: [name], [name] Date of Service: 08/06/2018 12:45 PM Medical Record Number: [id number] Patient Account Number: [id number] Date of Birth/Sex: 07/18/1927 (85 y.o. F) Treating RN: [name] Primary Care [name]: [name] Other Clinician: Referring Zetta Stoneman: [name] Treating Suesan Mohrmann/Extender: [name] Weeks in Treatment: 74 Visit Information History Since Last Visit Added or deleted any medications: No Patient Arrived: Walker Any new allergies or adverse reactions: No Arrival Time: 12:39 Had a fall or experienced change in No Accompanied By: self activities of daily living that may affect Transfer Assistance: None risk of falls: Patient Identification Verified: Yes Signs or symptoms of abuse/neglect since last visito No Secondary Verification Process Completed: Yes Hospitalized since last visit: No Has Dressing in Place as Prescribed: Yes Pain Present Now: Yes Electronic Signature(s) Signed: 08/09/2018 8:05:03 AM By: [name] Entered By: [name] on 08/06/2018 12:40:25 [name] ([id number]) -------------------------------------------------------------------------------- Clinic Level of Care Assessment Details Patient Name: [name], [name] Date of Service: 08/06/2018 12:45 PM Medical Record Number: [id number] Patient Account Number: [id number] Date of Birth/Sex: [birth year] (85 y.o. F) Treating RN: [name] Primary Care Bryceson Grape: [name] Other Clinician: Referring Lain Tetterton: [name] Treating Alysah Carton/Extender: [name] Weeks in Treatment: 19 Clinic Level of Care Assessment Items TOOL 4 Quantity Score []  - Use when only an EandM is performed on FOLLOW-UP visit 0 ASSESSMENTS - Nursing Assessment / Reassessment []  - Reassessment of Co-morbidities (includes updates in patient status) 0 []  -  0 Reassessment of Adherence to Treatment Plan ASSESSMENTS - Wound and Skin Assessment / Reassessment X - Simple Wound Assessment / Reassessment - one wound 1 5 []  - 0 Complex Wound Assessment / Reassessment - multiple wounds []  - 0 Dermatologic / Skin Assessment (not related to wound area) ASSESSMENTS - Focused Assessment []  - Circumferential Edema Measurements - multi extremities 0 []  - 0 Nutritional Assessment / Counseling / Intervention []  - 0 Lower Extremity Assessment (monofilament, tuning fork, pulses) []  - 0 Peripheral Arterial Disease Assessment (using hand held doppler) ASSESSMENTS - Ostomy and/or Continence Assessment and Care []  - Incontinence Assessment and Management 0 []  - 0 Ostomy Care Assessment and Management (repouching, etc.) PROCESS - Coordination of Care X - Simple Patient / Family Education for ongoing care 1 15 []  - 0 Complex (extensive) Patient / Family Education for ongoing care []  - 0 Staff obtains Programmer, systems, Records, Test Results / Process Orders []  - 0 Staff telephones HHA, Nursing Homes / Clarify orders / etc []  - 0 Routine Transfer to another Facility (non-emergent condition) []  - 0 Routine Hospital Admission (non-emergent condition) []  - 0 New Admissions / Biomedical engineer / Ordering NPWT, Apligraf, etc. []  - 0 Emergency Hospital Admission (emergent condition) X- 1 10 Simple Discharge Coordination [name], [name] ([id number]) []  - 0 Complex (extensive) Discharge Coordination PROCESS - Special Needs []  - Pediatric / Minor Patient Management 0 []  - 0 Isolation Patient Management []  - 0 Hearing / Language / Visual special needs []  - 0 Assessment of Community assistance (transportation, D/C planning, etc.) []  - 0 Additional assistance / Altered mentation []  - 0 Support Surface(s) Assessment (bed, cushion, seat, etc.) INTERVENTIONS - Wound Cleansing / Measurement X - Simple Wound Cleansing - one wound 1 5 []  - 0 Complex Wound  Cleansing - multiple wounds X- 1 5 Wound Imaging (photographs - any number of wounds) []  - 0 Wound Tracing (instead of photographs) X- 1 5 Simple Wound Measurement -  one wound []  - 0 Complex Wound Measurement - multiple wounds INTERVENTIONS - Wound Dressings X - Small Wound Dressing one or multiple wounds 1 10 []  - 0 Medium Wound Dressing one or multiple wounds []  - 0 Large Wound Dressing one or multiple wounds X- 1 5 Application of Medications - topical []  - 0 Application of Medications - injection INTERVENTIONS - Miscellaneous []  - External ear exam 0 []  - 0 Specimen Collection (cultures, biopsies, blood, body fluids, etc.) []  - 0 Specimen(s) / Culture(s) sent or taken to Lab for analysis []  - 0 Patient Transfer (multiple staff / Civil Service fast streamer / Similar devices) []  - 0 Simple Staple / Suture removal (25 or less) []  - 0 Complex Staple / Suture removal (26 or more) []  - 0 Hypo / Hyperglycemic Management (close monitor of Blood Glucose) []  - 0 Ankle / Brachial Index (ABI) - do not check if billed separately X- 1 5 Vital Signs [name], [name] ([id number]) Has the patient been seen at the hospital within the last three years: Yes Total Score: 65 Level Of Care: New/Established - Level 2 Electronic Signature(s) Signed: 08/09/2018 8:05:03 AM By: [name] Entered By: [name] on 08/06/2018 12:52:50 [name] ([id number]) -------------------------------------------------------------------------------- Encounter Discharge Information Details Patient Name: [name], [name] Date of Service: 08/06/2018 12:45 PM Medical Record Number: [id number] Patient Account Number: [id number] Date of Birth/Sex: July 07, 1927 (85 y.o. F) Treating RN: [name] Primary Care Francys Bolin: [name] Other Clinician: Referring Marquies Wanat: [name] Treating Amparo Donalson/Extender: [name] Weeks in Treatment: 19 Encounter Discharge Information Items Discharge  Condition: Stable Ambulatory Status: Walker Discharge Destination: Home Transportation: Private Auto Accompanied By: self Schedule Follow-up Appointment: Yes Clinical Summary of Care: Electronic Signature(s) Signed: 08/09/2018 8:05:03 AM By: [name] Entered By: [name] on 08/06/2018 12:54:50 [name] ([id number]) -------------------------------------------------------------------------------- Lower Extremity Assessment Details Patient Name: [name], [name] Date of Service: 08/06/2018 12:45 PM Medical Record Number: [id number] Patient Account Number: [id number] Date of Birth/Sex: [birth year] (85 y.o. F) Treating RN: [name] Primary Care Tanayah Squitieri: [name] Other Clinician: Referring Symiah Nowotny: [name] Treating Lejend Dalby/Extender: STONE III, [name] Weeks in Treatment: 19 Vascular Assessment Pulses: Dorsalis Pedis Palpable: [Right:Yes] Posterior Tibial Palpable: [Right:Yes] Electronic Signature(s) Signed: 08/09/2018 8:05:03 AM By: [name] Entered By: [name] on 08/06/2018 12:45:06 Swigart, Holiday C. ([id number]) -------------------------------------------------------------------------------- Multi Wound Chart Details Patient Name: [name], [name] Date of Service: 08/06/2018 12:45 PM Medical Record Number: [id number] Patient Account Number: [id number] Date of Birth/Sex: Mar 02, 1928 (85 y.o. F) Treating RN: [name] Primary Care Jomari Bartnik: [name] Other Clinician: Referring Tushar Enns: [name] Treating Tyleek Smick/Extender: [name] Weeks in Treatment: 19 Vital Signs Height(in): 58 Pulse(bpm): 60 Weight(lbs): 140 Blood Pressure(mmHg): 116/51 Body Mass Index(BMI): 29 Temperature(F): 98.1 Respiratory Rate 16 (breaths/min): Photos: [N/A:N/A] Wound Location: Right Lower Leg - Lateral N/A N/A Wounding Event: Trauma N/A N/A Primary Etiology: Diabetic Wound/Ulcer of the N/A N/A Lower  Extremity Comorbid History: Cataracts, Anemia, N/A N/A Arrhythmia, Coronary Artery Disease, Hypertension, Myocardial Infarction, Type II Diabetes, Osteoarthritis Date Acquired: 03/09/2018 N/A N/A Weeks of Treatment: 19 N/A N/A Wound Status: Open N/A N/A Measurements L x W x D 2.8x1x0.1 N/A N/A (cm) Area (cm) : 2.199 N/A N/A Volume (cm) : 0.22 N/A N/A % Reduction in Area: -16.70% N/A N/A % Reduction in Volume: 61.10% N/A N/A Classification: Grade 2 N/A N/A Exudate Amount: Medium N/A N/A Exudate Type: Purulent N/A N/A Exudate Color: yellow, brown, green N/A N/A Wound Margin: Flat and Intact N/A N/A  Granulation Amount: None Present (0%) N/A N/A Necrotic Amount: Large (67-100%) N/A N/A Exposed Structures: Fat Layer (Subcutaneous N/A N/A Tissue) Exposed: Yes Fascia: No Tendon: No [name], [name] ([id number]) Muscle: No Joint: No Bone: No Epithelialization: None N/A N/A Periwound Skin Texture: Excoriation: No N/A N/A Induration: No Callus: No Crepitus: No Rash: No Scarring: No Periwound Skin Moisture: Maceration: No N/A N/A Dry/Scaly: No Periwound Skin Color: Atrophie Blanche: No N/A N/A Cyanosis: No Ecchymosis: No Erythema: No Hemosiderin Staining: No Mottled: No Pallor: No Rubor: No Temperature: No Abnormality N/A N/A Tenderness on Palpation: Yes N/A N/A Treatment Notes Electronic Signature(s) Signed: 08/09/2018 8:05:03 AM By: [name] Entered By: [name] on 08/06/2018 12:51:57 Viera, [name] ([id number]) -------------------------------------------------------------------------------- Melvin Details Patient Name: Bares, Arli C. Date of Service: 08/06/2018 12:45 PM Medical Record Number: [id number] Patient Account Number: [id number] Date of Birth/Sex: Feb 04, 1928 (85 y.o. F) Treating RN: [name] Primary Care Minda Faas: [name] Other Clinician: Referring Kayne Yuhas: [name] Treating  Suman Trivedi/Extender: [name] Weeks in Treatment: 35 Active Inactive Abuse / Safety / Falls / Self Care Management Nursing Diagnoses: Potential for falls Goals: Patient will not experience any injury related to falls Date Initiated: 03/23/2018 Target Resolution Date: 06/09/2018 Goal Status: Active Interventions: Assess fall risk on admission and as needed Notes: Orientation to the Wound Care Program Nursing Diagnoses: Knowledge deficit related to the wound healing center program Goals: Patient/caregiver will verbalize understanding of the [place] Program Date Initiated: 03/23/2018 Target Resolution Date: 06/09/2018 Goal Status: Active Interventions: Provide education on orientation to the wound center Notes: Pain, Acute or Chronic Nursing Diagnoses: Pain, acute or chronic: actual or potential Goals: Patient will verbalize adequate pain control and receive pain control interventions during procedures as needed Date Initiated: 03/23/2018 Target Resolution Date: 06/09/2018 Goal Status: Active Interventions: Assess comfort goal upon admission Donahue, Koya C. ([id number]) Notes: Wound/Skin Impairment Nursing Diagnoses: Impaired tissue integrity Goals: Ulcer/skin breakdown will heal within 14 weeks Date Initiated: 03/23/2018 Target Resolution Date: 06/09/2018 Goal Status: Active Interventions: Assess patient/caregiver ability to obtain necessary supplies Assess patient/caregiver ability to perform ulcer/skin care regimen upon admission and as needed Assess ulceration(s) every visit Notes: Electronic Signature(s) Signed: 08/09/2018 8:05:03 AM By: [name] Entered By: [name] on 08/06/2018 12:51:48 [name], [name] ([id number]) -------------------------------------------------------------------------------- Pain Assessment Details Patient Name: [name], [name] Date of Service: 08/06/2018 12:45 PM Medical Record Number: [id number] Patient  Account Number: [id number] Date of Birth/Sex: 1928/01/26 (85 y.o. F) Treating RN: [name] Primary Care Anuj Summons: [name] Other Clinician: Referring Jazzlene Huot: [name] Treating Genese Quebedeaux/Extender: [name] Weeks in Treatment: 19 Active Problems Location of Pain Severity and Description of Pain Patient Has [name] Yes Site Locations Pain Management and Medication Current Pain Management: Electronic Signature(s) Signed: 08/09/2018 8:05:03 AM By: [name] Entered By: [name] on 08/06/2018 12:40:33 Dax, [name] ([id number]) -------------------------------------------------------------------------------- Patient/Caregiver Education Details Patient Name: Chamblee, Rindi C. Date of Service: 08/06/2018 12:45 PM Medical Record Number: [id number] Patient Account Number: [id number] Date of Birth/Gender: [birth year] (85 y.o. F) Treating RN: [name] Primary Care Physician: [name] Other Clinician: Referring Physician: Ria [name] Treating Physician/Extender: Sharalyn Ink in Treatment: 19 Education Assessment Education Provided To: Patient Education Topics Provided Wound/Skin Impairment: Handouts: Caring for Your Ulcer Methods: Demonstration, Explain/Verbal Responses: State content correctly Electronic Signature(s) Signed: 08/09/2018 8:05:03 AM By: [name] Entered By: [name] on 08/06/2018 12:52:15 Semrad, Laketta C. ([id number]) -------------------------------------------------------------------------------- Wound Assessment Details Patient Name: Chappuis, Nadie C. Date of  Service: 08/06/2018 12:45 PM Medical Record Number: [id number] Patient Account Number: [id number] Date of Birth/Sex: [birth year] (85 y.o. F) Treating RN: [name] Primary Care Brylee Mcgreal: [name] Other Clinician: Referring Kristy Schomburg: [name] Treating Marelly Wehrman/Extender: STONE III, [name] Weeks in Treatment:  19 Wound Status Wound Number: 8 Primary Diabetic Wound/Ulcer of the Lower Extremity Etiology: Wound Location: Right Lower Leg - Lateral Wound Open Wounding Event: Trauma Status: Date Acquired: 03/09/2018 Comorbid Cataracts, Anemia, Arrhythmia, Coronary Artery Weeks Of Treatment: 19 History: Disease, Hypertension, Myocardial Infarction, Clustered Wound: No Type II Diabetes, Osteoarthritis Photos Wound Measurements Length: (cm) 2.8 % Reduction in Width: (cm) 1 % Reduction in Depth: (cm) 0.1 Epithelializat Area: (cm) 2.199 Volume: (cm) 0.22 Area: -16.7% Volume: 61.1% ion: None Wound Description Classification: Grade 2 Foul Odor Afte Wound Margin: Flat and Intact Slough/Fibrino Exudate Amount: Medium Exudate Type: Purulent Exudate Color: yellow, brown, green [name] Cleansing: No Yes Wound Bed Granulation Amount: None Present (0%) Exposed Structure Necrotic Amount: Large (67-100%) Fascia Exposed: No Necrotic Quality: Adherent Slough Fat Layer (Subcutaneous Tissue) Exposed: Yes Tendon Exposed: No Muscle Exposed: No Joint Exposed: No Bone Exposed: No Periwound Skin Texture Texture Color Easler, Oretta C. ([id number]) No Abnormalities Noted: No No Abnormalities Noted: No Callus: No Atrophie Blanche: No Crepitus: No Cyanosis: No Excoriation: No Ecchymosis: No Induration: No Erythema: No Rash: No Hemosiderin Staining: No Scarring: No Mottled: No Pallor: No Moisture Rubor: No No Abnormalities Noted: No Dry / Scaly: No Temperature / Pain Maceration: No Temperature: No Abnormality Tenderness on Palpation: Yes Electronic Signature(s) Signed: 08/09/2018 8:05:03 AM By: [name] Entered By: [name] on 08/06/2018 12:44:33 Disney, [name] ([id number]) -------------------------------------------------------------------------------- Vitals Details Patient Name: Valido, Azaya C. Date of Service: 08/06/2018 12:45 PM Medical Record Number:  [id number] Patient Account Number: [id number] Date of Birth/Sex: [birth year] (85 y.o. F) Treating RN: [name] Primary Care Rommel Hogston: [name] Other Clinician: Referring Rosan Calbert: [name] Treating Rubby Barbary/Extender: STONE III, [name] Weeks in Treatment: 19 Vital Signs Time Taken: 12:40 Temperature (F): 98.1 Height (in): 58 Pulse (bpm): 60 Weight (lbs): 140 Respiratory Rate (breaths/min): 16 Body Mass Index (BMI): 29.3 Blood Pressure (mmHg): 116/51 Reference Range: 80 - 120 mg / dl Electronic Signature(s) Signed: 08/09/2018 8:05:03 AM By: [name] Entered By: [name] on 08/06/2018 12:48:55

## 2018-08-09 NOTE — Progress Notes (Signed)
[name] ([id number]) Visit Report for 08/06/2018 Chief Complaint Document Details Patient Name: [name], [name] Date of Service: 08/06/2018 12:45 PM Medical Record Number: [id number] Patient Account Number: [id number] Date of Birth/Sex: 07/02/1927 (85 y.o. F) Treating RN: Harold Barban Primary Care Provider: Ria Bush Other Clinician: Referring Provider: Ria Bush Treating Provider/Extender: Melburn Hake, HOYT Weeks in Treatment: 19 Information Obtained from: Patient Chief Complaint RLE wounds Electronic Signature(s) Signed: 08/06/2018 4:49:37 PM By: Worthy Keeler PA-C Entered By: Worthy Keeler on 08/06/2018 12:48:38 [name] ([id number]) -------------------------------------------------------------------------------- HPI Details Patient Name: [name], [name] Date of Service: 08/06/2018 12:45 PM Medical Record Number: [id number] Patient Account Number: [id number] Date of Birth/Sex: 07-19-1927 (85 y.o. F) Treating RN: Harold Barban Primary Care Provider: Ria Bush Other Clinician: Referring Provider: Ria Bush Treating Provider/Extender: Melburn Hake, HOYT Weeks in Treatment: 19 History of Present Illness HPI Description: Readmission: 03/23/18 on evaluation today patient presents for readmission entire clinic concerning issues that she has been having for the past couple of weeks in regard to her right lower extremity. The medial injury actually occurred when her dog tried to take a blanket from her and subsequently scratched her with his toenail. Subsequently the lateral injury was a wound where she dropped a knife in her kitchen and hit the side of her leg. With that being said both have been open for some time and home health nurse that has been coming out marked for her where the redness was several days ago. Nonetheless this has spread will be on the margin of what was marked. She was placed on Keflex which was started two days ago  but at this point it does not seem to have helped with any improvement in the overall appearance of the wounds. No fevers, chills, nausea, or vomiting noted at this time. 04/02/18 Seen today for follow up and management of right lower extremity wounds. Right lateral wound with adherent slough. She is complaining of pain to touch which limited exam. Recently treated in the ED for cellulitis and wound culture positive for MRSA. Completed a dose of Vancomycin and transitioned to doxycycline by mouth. Erythema remains present with improvement since last visit. Right medial wound with scab present. No fevers, chills, nausea, or vomiting noted at this time. 04/10/18 on evaluation today patient appears to be doing a Wheat better in regard to her right lateral lower [name]. The slough does seem to be coming off although it is slow from the wound bed. Fortunately there does not appear to be any evidence of infection at this time which is good news. With that being said I do believe that she is going to require longer treatment with the Santyl simply due to the fact that I'm not able to really debride the wound due to the pain that she is experiencing. 04/16/18 upon evaluation today patient's wound bed currently on the lateral lower extremity on the right appears to be doing a Fitzmaurice better at this point. We are utilizing sensible currently. Overall I'm very pleased with how things are progressing although there still is some work to do before this will be completely healed. No fevers, chills, nausea, or vomiting noted at this time. 04/23/18 on evaluation today patient appears to be doing about the same at this point maybe just a [place] bit smaller than last week's evaluation. She continues to have some slough buildup on the surface of the wound and has very Mccaslin granulation unfortunately. This seems to be very slow moving  I'm getting somewhat concerned about the possibility of her having decreased  blood flow which is leading to her subsequently not being able to heal this area as quickly as she shut otherwise. She is currently utilizing Santyl. 05/01/18 on evaluation today patient appears to be doing somewhat poorly in regard to her lower extremity ulcer. At this point she's been tolerating the dressing changes to some degree without complication. With that being said she does note of the past several days that she has been having more pain unfortunately. On inspection it appears that she may have signs of infection which would account for the pain. 05/07/18 on evaluation today patient's wound actually appears to be doing better she's not having as much erythema in the right lower extremity as compared to last week. Fortunately there's no signs of infection this is good news as well. Overall very pleased with the progress that she has made up to this point. 05/14/18 on evaluation today patient actually appears to be doing rather well in regard to her right lower extremity ulcer. She does not have erythema that was characterizing the cellulitis prior to initiation of antibiotic therapy. With that being said her wound still appears to be somewhat slow in healing I believe this may be arterial in nature that is my concern at least. With that being said I do want her to see the vascular specialist as soon as possible we been trying to get this set up I'm not sure if she's not answering the phone or what exactly is going on here. [name], [name] ([id number]) 05/21/18 on evaluation today patient's wound actually appears to be doing a Yakel better possibly some additional granulation compared to my last evaluation. With that being said she still continues to have a lot of pain especially when her legs are elevated such as sitting in the chair here in the office. There does not appear to be any signs of infection which is good news. 05/28/18 on evaluation today patient appears to be doing a [place]ico[place]  better in regard to her right lateral lower [name]. Again she was supposed to have an appointment with vascular unfortunately they attempted to call her three times and unfortunately were unable to get anything scheduled. That reason closed up for him. She tells me that if the phone is answered and she doesn't recognize what is that she will hang up as well those that she lives with. Obviously she's not gonna recognize somebody calling from the office that she has not previously spoken with. Nonetheless the patient states that she does not remember any fall. I think if we're gonna get her in with vascular specialist we're gonna have to try to make the appointment for her. 06/04/2018 Seen today for follow up and management of right lateral lower extremity wound. Adherent slough present over surface of wound. Scheduled to follow up for ABI/TBI on 06/06/2018 with vein and vascular specialist. She is hard of hearing. Wound is sensitive to touch. No signs or symptoms of infection. Denies any recent fevers, chills, or SOB. 06/11/18 on evaluation today patient actually appears to be doing very well in regard to her right lateral lower extremity ulcer. She's been tolerating the dressing changes without complication. Fortunately there's no evidence of infection. With that being said she did have her arterial studies which revealed that she had a very low ABI which was 0.67 on the right with a TBI of 0.32 and 0.65 on the left with a TBI of 0.16. I  do believe she needs to see vascular and we are working on that referral as well. 06/21/18 on evaluation today patient's wound actually appears to be doing okay she does have an appointment with vascular coming up on the 24th. Nonetheless she's been having some issues that are more urinary in nature right now that are causing trouble for her. Fortunately there's no signs of severe sepsis although she is running a low-grade temperature 99.9 on evaluation today. She  also is having some issues right now with chills as well as urinary urgency and frequency. Again I feel like the city is more urinary and symptomology I do not believe she has anything upper respiratory which is good news. Nonetheless I think this may need to be checked out more quickly especially in light of the fact that she's having significant back pain at this point. 07/02/18 on evaluation today patient appears to be doing okay in regard to her ulcer on the right lower extremity. She's been tolerating the dressing changes without complication. Fortunately there does not appear to be any signs of infection which is good news. No fevers, chills, nausea, or vomiting noted at this time. 07/10/18 on evaluation today patient appears to be doing well in regard to her right lateral lower extremity ulcer all things considering. She's been using this form this seems to be the one thing that she can really do on her own and she seems to be making some progress obviously help with this matter. I think the biggest issue limiting her feeling speed is her blood flow she canceled her original appointment with vascular her next one is not till May. She was worried about the Covid-19 Virus and having to see another physician. Nonetheless there's no signs of active infection at this time. 07/16/18 on evaluation today patient appears to be doing okay in regard to her right lateral lower extremity ulcer. She's been tolerating the dressing changes without complication. Fortunately there's no signs of active infection at this time. No fevers, chills, nausea, or vomiting noted at this time. 07/23/18 on evaluation today patient appears to be doing very well in regard to her right lateral load should be ulcer all things considering. She still again is not seeing vascular until mid May in the meantime she's been utilizing the over-the-counter tripling about appointment which seems to be the only thing she's able to do  routinely on her own without getting confused or frustrated. Overall it seems to be doing okay fortunately she's showing no signs of infection. 07/30/18 upon evaluation today patient actually appears to be doing about the same in regard to her wound in general. She is making very slow progress most likely again due to the issue with her blood flow she sees vascular towards the middle of news. In the meantime we've really been trying to just keep things in the control from the infection standpoint and allow for any healing that could take place up until appointment she can see vascular. 08/06/18 upon evaluation today patient's wound actually appears to be doing about the same although the wound bed may show Soderlund bit more granulation which is good news. Fortunately there's no signs of active infection at this time. No fevers, chills, nausea, or vomiting noted at this time. KALINDA, ROMANIELLO ([id number]) Electronic Signature(s) Signed: 08/06/2018 4:49:37 PM By: Worthy Keeler PA-C Entered By: Worthy Keeler on 08/06/2018 12:54:53 Hinzman, [name] ([id number]) -------------------------------------------------------------------------------- Physical Exam Details Patient Name: [name], [name] Date of Service: 08/06/2018 12:45  PM Medical Record Number: [id number] Patient Account Number: [id number] Date of Birth/Sex: 05/27/1927 (85 y.o. F) Treating RN: Harold Barban Primary Care Provider: Ria Bush Other Clinician: Referring Provider: Ria Bush Treating Provider/Extender: STONE III, HOYT Weeks in Treatment: 58 Constitutional Well-nourished and well-hydrated in no acute distress. Respiratory normal breathing without difficulty. clear to auscultation bilaterally. Cardiovascular regular rate and rhythm with normal S1, S2. Psychiatric this patient is able to make decisions and demonstrates good insight into disease process. Alert and Oriented x 3. pleasant and  cooperative. Notes Patient's wound bed currently shows evidence of good granulation at this time there does not appear to be any signs of active infection. Fortunately she is able to perform the dressing changes that she has been recommended at this point without complication. Everything else we tried she was having trouble actually performing the dressing change appropriately. This seems to be helping her make slow but nonetheless some progress. Electronic Signature(s) Signed: 08/06/2018 4:49:37 PM By: Worthy Keeler PA-C Entered By: Worthy Keeler on 08/06/2018 12:55:43 Marchesi, [name] ([id number]) -------------------------------------------------------------------------------- Physician Orders Details Patient Name: [name], [name] Date of Service: 08/06/2018 12:45 PM Medical Record Number: [id number] Patient Account Number: [id number] Date of Birth/Sex: [birth year] (85 y.o. F) Treating RN: Harold Barban Primary Care Provider: Ria Bush Other Clinician: Referring Provider: Ria Bush Treating Provider/Extender: Melburn Hake, HOYT Weeks in Treatment: 75 Verbal / Phone Orders: No Diagnosis Coding ICD-10 Coding Code Description I87.331 Chronic venous hypertension (idiopathic) with ulcer and inflammation of right lower extremity E11.622 Type 2 diabetes mellitus with other skin ulcer L97.812 Non-pressure chronic ulcer of other part of right lower leg with fat layer exposed L03.115 Cellulitis of right lower limb Wound Cleansing Wound #8 Right,Lateral Lower Leg o Clean wound with Normal Saline. Anesthetic (add to Medication List) Wound #8 Right,Lateral Lower Leg o Topical Lidocaine 4% cream applied to wound bed prior to debridement (In Clinic Only). Primary Wound Dressing Wound #8 Right,Lateral Lower Leg o Other: - Antibiotic ointment Secondary Dressing Wound #8 Right,Lateral Lower Leg o ABD and Kerlix/Conform o Non-adherent pad Dressing Change  Frequency Wound #8 Right,Lateral Lower Leg o Change dressing every day. Follow-up Appointments Wound #8 Right,Lateral Lower Leg o Return Appointment in 1 week. Edema Control Wound #8 Right,Lateral Lower Leg o Elevate legs to the level of the heart and pump ankles as often as possible o Other: - tubigrip Electronic Signature(s) Signed: 08/06/2018 4:49:37 PM By: Marlou Porch, [name] ([id number]) Signed: 08/09/2018 8:05:03 AM By: Harold Barban Entered By: Harold Barban on 08/06/2018 12:53:53 Crigler, [name] ([id number]) -------------------------------------------------------------------------------- Problem List Details Patient Name: [name], [name] Date of Service: 08/06/2018 12:45 PM Medical Record Number: [id number] Patient Account Number: [id number] Date of Birth/Sex: 07/04/1927 (85 y.o. F) Treating RN: Harold Barban Primary Care Provider: Ria Bush Other Clinician: Referring Provider: Ria Bush Treating Provider/Extender: Melburn Hake, HOYT Weeks in Treatment: 19 Active Problems ICD-10 Evaluated Encounter Code Description Active Date Today Diagnosis I87.331 Chronic venous hypertension (idiopathic) with ulcer and 03/23/2018 No Yes inflammation of right lower extremity E11.622 Type 2 diabetes mellitus with other skin ulcer 03/23/2018 No Yes L97.812 Non-pressure chronic ulcer of other part of right lower leg 03/23/2018 No Yes with fat layer exposed L03.115 Cellulitis of right lower limb 03/23/2018 No Yes Inactive Problems Resolved Problems Electronic Signature(s) Signed: 08/06/2018 4:49:37 PM By: Worthy Keeler PA-C Entered By: Worthy Keeler on 08/06/2018 12:48:14 Harnois, [name] ([id number]) -------------------------------------------------------------------------------- Progress Note Details Patient Name: [name], [name] Date of  Service: 08/06/2018 12:45 PM Medical Record Number: [id number] Patient Account Number:  [id number] Date of Birth/Sex: 03-07-1928 (85 y.o. F) Treating RN: Harold Barban Primary Care Provider: Ria Bush Other Clinician: Referring Provider: Ria Bush Treating Provider/Extender: Melburn Hake, HOYT Weeks in Treatment: 19 Subjective Chief Complaint Information obtained from Patient RLE wounds History of Present Illness (HPI) Readmission: 03/23/18 on evaluation today patient presents for readmission entire clinic concerning issues that she has been having for the past couple of weeks in regard to her right lower extremity. The medial injury actually occurred when her dog tried to take a blanket from her and subsequently scratched her with his toenail. Subsequently the lateral injury was a wound where she dropped a knife in her kitchen and hit the side of her leg. With that being said both have been open for some time and home health nurse that has been coming out marked for her where the redness was several days ago. Nonetheless this has spread will be on the margin of what was marked. She was placed on Keflex which was started two days ago but at this point it does not seem to have helped with any improvement in the overall appearance of the wounds. No fevers, chills, nausea, or vomiting noted at this time. 04/02/18 Seen today for follow up and management of right lower extremity wounds. Right lateral wound with adherent slough. She is complaining of pain to touch which limited exam. Recently treated in the ED for cellulitis and wound culture positive for MRSA. Completed a dose of Vancomycin and transitioned to doxycycline by mouth. Erythema remains present with improvement since last visit. Right medial wound with scab present. No fevers, chills, nausea, or vomiting noted at this time. 04/10/18 on evaluation today patient appears to be doing a Polkowski better in regard to her right lateral lower [name]. The slough does seem to be coming off although it is slow from  the wound bed. Fortunately there does not appear to be any evidence of infection at this time which is good news. With that being said I do believe that she is going to require longer treatment with the Santyl simply due to the fact that I'm not able to really debride the wound due to the pain that she is experiencing. 04/16/18 upon evaluation today patient's wound bed currently on the lateral lower extremity on the right appears to be doing a Haynes better at this point. We are utilizing sensible currently. Overall I'm very pleased with how things are progressing although there still is some work to do before this will be completely healed. No fevers, chills, nausea, or vomiting noted at this time. 04/23/18 on evaluation today patient appears to be doing about the same at this point maybe just a Hammar bit smaller than last week's evaluation. She continues to have some slough buildup on the surface of the wound and has very Ullom granulation unfortunately. This seems to be very slow moving I'm getting somewhat concerned about the possibility of her having decreased blood flow which is leading to her subsequently not being able to heal this area as quickly as she shut otherwise. She is currently utilizing Santyl. 05/01/18 on evaluation today patient appears to be doing somewhat poorly in regard to her lower extremity ulcer. At this point she's been tolerating the dressing changes to some degree without complication. With that being said she does note of the past several days that she has been having more pain unfortunately. On inspection it appears that  she may have signs of infection which would account for the pain. 05/07/18 on evaluation today patient's wound actually appears to be doing better she's not having as much erythema in the right lower extremity as compared to last week. Fortunately there's no signs of infection this is good news as well. Overall very pleased with the progress that she  has made up to this point. TERRESSA, EVOLA ([id number]) 05/14/18 on evaluation today patient actually appears to be doing rather well in regard to her right lower extremity ulcer. She does not have erythema that was characterizing the cellulitis prior to initiation of antibiotic therapy. With that being said her wound still appears to be somewhat slow in healing I believe this may be arterial in nature that is my concern at least. With that being said I do want her to see the vascular specialist as soon as possible we been trying to get this set up I'm not sure if she's not answering the phone or what exactly is going on here. 05/21/18 on evaluation today patient's wound actually appears to be doing a Lorentz better possibly some additional granulation compared to my last evaluation. With that being said she still continues to have a lot of pain especially when her legs are elevated such as sitting in the chair here in the office. There does not appear to be any signs of infection which is good news. 05/28/18 on evaluation today patient appears to be doing a Westerhoff better in regard to her right lateral lower [name]. Again she was supposed to have an appointment with vascular unfortunately they attempted to call her three times and unfortunately were unable to get anything scheduled. That reason closed up for him. She tells me that if the phone is answered and she doesn't recognize what is that she will hang up as well those that she lives with. Obviously she's not gonna recognize somebody calling from the office that she has not previously spoken with. Nonetheless the patient states that she does not remember any fall. I think if we're gonna get her in with vascular specialist we're gonna have to try to make the appointment for her. 06/04/2018 Seen today for follow up and management of right lateral lower extremity wound. Adherent slough present over surface of wound. Scheduled to follow up for  ABI/TBI on 06/06/2018 with vein and vascular specialist. She is hard of hearing. Wound is sensitive to touch. No signs or symptoms of infection. Denies any recent fevers, chills, or SOB. 06/11/18 on evaluation today patient actually appears to be doing very well in regard to her right lateral lower extremity ulcer. She's been tolerating the dressing changes without complication. Fortunately there's no evidence of infection. With that being said she did have her arterial studies which revealed that she had a very low ABI which was 0.67 on the right with a TBI of 0.32 and 0.65 on the left with a TBI of 0.16. I do believe she needs to see vascular and we are working on that referral as well. 06/21/18 on evaluation today patient's wound actually appears to be doing okay she does have an appointment with vascular coming up on the 24th. Nonetheless she's been having some issues that are more urinary in nature right now that are causing trouble for her. Fortunately there's no signs of severe sepsis although she is running a low-grade temperature 99.9 on evaluation today. She also is having some issues right now with chills as well as urinary urgency and  frequency. Again I feel like the city is more urinary and symptomology I do not believe she has anything upper respiratory which is good news. Nonetheless I think this may need to be checked out more quickly especially in light of the fact that she's having significant back pain at this point. 07/02/18 on evaluation today patient appears to be doing okay in regard to her ulcer on the right lower extremity. She's been tolerating the dressing changes without complication. Fortunately there does not appear to be any signs of infection which is good news. No fevers, chills, nausea, or vomiting noted at this time. 07/10/18 on evaluation today patient appears to be doing well in regard to her right lateral lower extremity ulcer all things considering. She's been using  this form this seems to be the one thing that she can really do on her own and she seems to be making some progress obviously help with this matter. I think the biggest issue limiting her feeling speed is her blood flow she canceled her original appointment with vascular her next one is not till May. She was worried about the Covid-19 Virus and having to see another physician. Nonetheless there's no signs of active infection at this time. 07/16/18 on evaluation today patient appears to be doing okay in regard to her right lateral lower extremity ulcer. She's been tolerating the dressing changes without complication. Fortunately there's no signs of active infection at this time. No fevers, chills, nausea, or vomiting noted at this time. 07/23/18 on evaluation today patient appears to be doing very well in regard to her right lateral load should be ulcer all things considering. She still again is not seeing vascular until mid May in the meantime she's been utilizing the over-the-counter tripling about appointment which seems to be the only thing she's able to do routinely on her own without getting confused or frustrated. Overall it seems to be doing okay fortunately she's showing no signs of infection. 07/30/18 upon evaluation today patient actually appears to be doing about the same in regard to her wound in general. She is making very slow progress most likely again due to the issue with her blood flow she sees vascular towards the middle of news. In the meantime we've really been trying to just keep things in the control from the infection standpoint and allow for any healing that could take place up until appointment she can see vascular. [name], [name] ([id number]) 08/06/18 upon evaluation today patient's wound actually appears to be doing about the same although the wound bed may show [place] bit more granulation which is good news. Fortunately there's no signs of active infection at this time. No  fevers, chills, nausea, or vomiting noted at this time. Patient History Information obtained from Patient. Family History Diabetes - Father,Siblings,Child, Hypertension - Child, Lung Disease - Child, Stroke - Mother, No family history of Cancer, Heart Disease, Kidney Disease, Seizures, Thyroid Problems. Social History Former smoker, Marital Status - Single, Alcohol Use - Never, Drug Use - No History, Caffeine Use - Daily. Medical History Eyes Patient has history of Cataracts - removed Denies history of Glaucoma, Optic Neuritis Hematologic/Lymphatic Patient has history of Anemia Denies history of Hemophilia, Human Immunodeficiency Virus, Lymphedema, Sickle Cell Disease Respiratory Denies history of Aspiration, Asthma, Chronic Obstructive Pulmonary Disease (COPD), Pneumothorax, Sleep Apnea, Tuberculosis Cardiovascular Patient has history of Arrhythmia, Coronary Artery Disease, Hypertension, Myocardial Infarction Denies history of Angina, Congestive Heart Failure, Deep Vein Thrombosis, Hypotension, Peripheral Arterial Disease, Peripheral Venous Disease,  Phlebitis Endocrine Patient has history of Type II Diabetes Denies history of Type I Diabetes Genitourinary Denies history of End Stage Renal Disease Immunological Denies history of Lupus Erythematosus, Raynaud s, Scleroderma Integumentary (Skin) Denies history of History of Burn, History of pressure wounds Musculoskeletal Patient has history of Osteoarthritis Denies history of Gout, Rheumatoid Arthritis, Osteomyelitis Neurologic Denies history of Dementia, Neuropathy, Quadriplegia, Paraplegia, Seizure Disorder Oncologic Denies history of Received Chemotherapy, Received Radiation Medical And Surgical History Notes Constitutional Symptoms (General Health) High Blood Pressure, open heart surgery, gall stone, hysterectomy; Fluid pill, Anxiety Review of Systems (ROS) Constitutional Symptoms (General Health) Denies complaints or  symptoms of Fatigue, Fever, Chills, Marked Weight Change. Respiratory Denies complaints or symptoms of Chronic or frequent coughs, Shortness of Breath. Cardiovascular Denies complaints or symptoms of Chest pain, LE edema. Psychiatric Denies complaints or symptoms of Anxiety, Claustrophobia. Taddeo, Isatou C. ([id number]) Objective Constitutional Well-nourished and well-hydrated in no acute distress. Vitals Time Taken: 12:40 PM, Height: 58 in, Weight: 140 lbs, BMI: 29.3, Temperature: 98.1 F, Pulse: 60 bpm, Respiratory Rate: 16 breaths/min, Blood Pressure: 116/51 mmHg. Respiratory normal breathing without difficulty. clear to auscultation bilaterally. Cardiovascular regular rate and rhythm with normal S1, S2. Psychiatric this patient is able to make decisions and demonstrates good insight into disease process. Alert and Oriented x 3. pleasant and cooperative. General Notes: Patient's wound bed currently shows evidence of good granulation at this time there does not appear to be any signs of active infection. Fortunately she is able to perform the dressing changes that she has been recommended at this point without complication. Everything else we tried she was having trouble actually performing the dressing change appropriately. This seems to be helping her make slow but nonetheless some progress. Integumentary (Hair, Skin) Wound #8 status is Open. Original cause of wound was Trauma. The wound is located on the Right,Lateral Lower Leg. The wound measures 2.8cm length x 1cm width x 0.1cm depth; 2.199cm^2 area and 0.22cm^3 volume. There is Fat Layer (Subcutaneous Tissue) Exposed exposed. There is a medium amount of purulent drainage noted. The wound margin is flat and intact. There is no granulation within the wound bed. There is a large (67-100%) amount of necrotic tissue within the wound bed including Adherent Slough. The periwound skin appearance did not exhibit: Callus, Crepitus,  Excoriation, Induration, Rash, Scarring, Dry/Scaly, Maceration, Atrophie Blanche, Cyanosis, Ecchymosis, Hemosiderin Staining, Mottled, Pallor, Rubor, Erythema. Periwound temperature was noted as No Abnormality. The periwound has tenderness on palpation. Assessment Active Problems ICD-10 Chronic venous hypertension (idiopathic) with ulcer and inflammation of right lower extremity Type 2 diabetes mellitus with other skin ulcer Non-pressure chronic ulcer of other part of right lower leg with fat layer exposed Cellulitis of right lower limb [name], [name] ([id number]) Plan Wound Cleansing: Wound #8 Right,Lateral Lower Leg: Clean wound with Normal Saline. Anesthetic (add to Medication List): Wound #8 Right,Lateral Lower Leg: Topical Lidocaine 4% cream applied to wound bed prior to debridement (In Clinic Only). Primary Wound Dressing: Wound #8 Right,Lateral Lower Leg: Other: - Antibiotic ointment Secondary Dressing: Wound #8 Right,Lateral Lower Leg: ABD and Kerlix/Conform Non-adherent pad Dressing Change Frequency: Wound #8 Right,Lateral Lower Leg: Change dressing every day. Follow-up Appointments: Wound #8 Right,Lateral Lower Leg: Return Appointment in 1 week. Edema Control: Wound #8 Right,Lateral Lower Leg: Elevate legs to the level of the heart and pump ankles as often as possible Other: - tubigrip I'm in a recommend currently that we continue with the above wound care measures for the next week and  the patient is in agreement with plan. We will subsequently see were things that at follow-up. If anything changes worsens meantime he will contact the office let me know. Please see above for specific wound care orders. We will see patient for re-evaluation in 1 week(s) here in the clinic. If anything worsens or changes patient will contact our office for additional recommendations. Electronic Signature(s) Signed: 08/06/2018 4:49:37 PM By: Worthy Keeler PA-C Entered By: Worthy Keeler on 08/06/2018 12:56:11 [name] ([id number]) -------------------------------------------------------------------------------- ROS/PFSH Details Patient Name: [name], [name] Date of Service: 08/06/2018 12:45 PM Medical Record Number: [id number] Patient Account Number: [id number] Date of Birth/Sex: [birth year] (85 y.o. F) Treating RN: Harold Barban Primary Care Provider: Ria Bush Other Clinician: Referring Provider: Ria Bush Treating Provider/Extender: STONE III, HOYT Weeks in Treatment: 19 Information Obtained From Patient Constitutional Symptoms (General Health) Complaints and Symptoms: Negative for: Fatigue; Fever; Chills; Marked Weight Change Medical History: Past Medical History Notes: High Blood Pressure, open heart surgery, gall stone, hysterectomy; Fluid pill, Anxiety Respiratory Complaints and Symptoms: Negative for: Chronic or frequent coughs; Shortness of Breath Medical History: Negative for: Aspiration; Asthma; Chronic Obstructive Pulmonary Disease (COPD); Pneumothorax; Sleep Apnea; Tuberculosis Cardiovascular Complaints and Symptoms: Negative for: Chest pain; LE edema Medical History: Positive for: Arrhythmia; Coronary Artery Disease; Hypertension; Myocardial Infarction Negative for: Angina; Congestive Heart Failure; Deep Vein Thrombosis; Hypotension; Peripheral Arterial Disease; Peripheral Venous Disease; Phlebitis Psychiatric Complaints and Symptoms: Negative for: Anxiety; Claustrophobia Eyes Medical History: Positive for: Cataracts - removed Negative for: Glaucoma; Optic Neuritis Hematologic/Lymphatic Medical History: Positive for: Anemia Negative for: Hemophilia; Human Immunodeficiency Virus; Lymphedema; Sickle Cell Disease Endocrine [name], [name] ([id number]) Medical History: Positive for: Type II Diabetes Negative for: Type I Diabetes Time with diabetes: no idea Treated with: Oral agents Blood sugar tested  every day: No Genitourinary Medical History: Negative for: End Stage Renal Disease Immunological Medical History: Negative for: Lupus Erythematosus; Raynaudos; Scleroderma Integumentary (Skin) Medical History: Negative for: History of Burn; History of pressure wounds Musculoskeletal Medical History: Positive for: Osteoarthritis Negative for: Gout; Rheumatoid Arthritis; Osteomyelitis Neurologic Medical History: Negative for: Dementia; Neuropathy; Quadriplegia; Paraplegia; Seizure Disorder Oncologic Medical History: Negative for: Received Chemotherapy; Received Radiation HBO Extended History Items Eyes: Cataracts Immunizations Pneumococcal Vaccine: Received Pneumococcal Vaccination: Yes Implantable Devices No devices added Family and Social History Cancer: No; Diabetes: Yes - Father,Siblings,Child; Heart Disease: No; Hypertension: Yes - Child; Kidney Disease: No; Lung Disease: Yes - Child; Seizures: No; Stroke: Yes - Mother; Thyroid Problems: No; Former smoker; Marital Status - Single; Alcohol Use: Never; Drug Use: No History; Caffeine Use: Daily; Financial Concerns: No; Food, Clothing or Shelter Needs: No; Support System Lacking: No; Transportation Concerns: No Physician Affirmation I have reviewed and agree with the above information. LEKEISHA, ARENAS ([id number]) Electronic Signature(s) Signed: 08/06/2018 4:49:37 PM By: Worthy Keeler PA-C Signed: 08/09/2018 8:05:03 AM By: Harold Barban Entered By: Worthy Keeler on 08/06/2018 12:55:20 Pates, [name] ([id number]) -------------------------------------------------------------------------------- SuperBill Details Patient Name: Guerette, Illianna C. Date of Service: 08/06/2018 Medical Record Number: [id number] Patient Account Number: [id number] Date of Birth/Sex: 1927-12-18 (85 y.o. F) Treating RN: Harold Barban Primary Care Provider: Ria Bush Other Clinician: Referring Provider: Ria Bush Treating  Provider/Extender: Melburn Hake, HOYT Weeks in Treatment: 19 Diagnosis Coding ICD-10 Codes Code Description I87.331 Chronic venous hypertension (idiopathic) with ulcer and inflammation of right lower extremity E11.622 Type 2 diabetes mellitus with other skin ulcer L97.812 Non-pressure chronic ulcer of other part of right lower leg with fat layer exposed  L03.115 Cellulitis of right lower limb Facility Procedures CPT4 Code: 19166060 Description: [phone] - WOUND CARE VISIT-LEV 2 EST PT Modifier: Quantity: 1 Physician Procedures CPT4: Description Modifier Quantity Code [id number] - WC PHYS LEVEL 4 - EST PT 1 ICD-10 Diagnosis Description I87.331 Chronic venous hypertension (idiopathic) with ulcer and inflammation of right lower extremity E11.622 Type 2 diabetes mellitus with  other skin ulcer L97.812 Non-pressure chronic ulcer of other part of right lower leg with fat layer exposed L03.115 Cellulitis of right lower limb Electronic Signature(s) Signed: 08/06/2018 4:49:37 PM By: Worthy Keeler PA-C Entered By: Worthy Keeler on 08/06/2018 12:56:24

## 2018-08-13 ENCOUNTER — Ambulatory Visit: Payer: Medicare Other | Admitting: Endocrinology

## 2018-08-14 ENCOUNTER — Other Ambulatory Visit: Payer: Self-pay

## 2018-08-14 ENCOUNTER — Encounter: Payer: Medicare Other | Admitting: Physician Assistant

## 2018-08-14 DIAGNOSIS — E11622 Type 2 diabetes mellitus with other skin ulcer: Secondary | ICD-10-CM | POA: Diagnosis not present

## 2018-08-14 DIAGNOSIS — L03115 Cellulitis of right lower limb: Secondary | ICD-10-CM | POA: Diagnosis not present

## 2018-08-14 DIAGNOSIS — I10 Essential (primary) hypertension: Secondary | ICD-10-CM | POA: Diagnosis not present

## 2018-08-14 DIAGNOSIS — Z7984 Long term (current) use of oral hypoglycemic drugs: Secondary | ICD-10-CM | POA: Diagnosis not present

## 2018-08-14 DIAGNOSIS — L97812 Non-pressure chronic ulcer of other part of right lower leg with fat layer exposed: Secondary | ICD-10-CM | POA: Diagnosis not present

## 2018-08-14 DIAGNOSIS — I252 Old myocardial infarction: Secondary | ICD-10-CM | POA: Diagnosis not present

## 2018-08-16 NOTE — Progress Notes (Signed)
KI, LUCKMAN ([id number]) Visit Report for 08/14/2018 Arrival Information Details Patient Name: [name], [name] Date of Service: 08/14/2018 1:30 PM Medical Record Number: [id number] Patient Account Number: [id number] Date of Birth/Sex: 04-06-1927 (85 y.o. F) Treating RN: Montey Hora Primary Care Darwyn Ponzo: Ria Bush Other Clinician: Referring Britiany Silbernagel: Ria Bush Treating Sahil Milner/Extender: Melburn Hake, HOYT Weeks in Treatment: 20 Visit Information History Since Last Visit Added or deleted any medications: No Patient Arrived: Walker Any new allergies or adverse reactions: No Arrival Time: 13:47 Had a fall or experienced change in No Accompanied By: self activities of daily living that may affect Transfer Assistance: None risk of falls: Patient Identification Verified: Yes Signs or symptoms of abuse/neglect since last visito No Secondary Verification Process Completed: Yes Hospitalized since last visit: No Implantable device outside of the clinic excluding No cellular tissue based products placed in the center since last visit: Has Dressing in Place as Prescribed: Yes Pain Present Now: Yes Electronic Signature(s) Signed: 08/14/2018 2:01:48 PM By: Lorine Bears RCP, RRT, CHT Entered By: Lorine Bears on 08/14/2018 13:48:42 [name], [name] ([id number]) -------------------------------------------------------------------------------- Clinic Level of Care Assessment Details Patient Name: Applin, Bailyn C. Date of Service: 08/14/2018 1:30 PM Medical Record Number: [id number] Patient Account Number: [id number] Date of Birth/Sex: 1927/08/03 (85 y.o. F) Treating RN: Montey Hora Primary Care Johniece Hornbaker: Ria Bush Other Clinician: Referring Ladarian Bonczek: Ria Bush Treating Erminie Foulks/Extender: Melburn Hake, HOYT Weeks in Treatment: 20 Clinic Level of Care Assessment Items TOOL 4 Quantity Score []  - Use when only an EandM is  performed on FOLLOW-UP visit 0 ASSESSMENTS - Nursing Assessment / Reassessment X - Reassessment of Co-morbidities (includes updates in patient status) 1 10 X- 1 5 Reassessment of Adherence to Treatment Plan ASSESSMENTS - Wound and Skin Assessment / Reassessment X - Simple Wound Assessment / Reassessment - one wound 1 5 []  - 0 Complex Wound Assessment / Reassessment - multiple wounds []  - 0 Dermatologic / Skin Assessment (not related to wound area) ASSESSMENTS - Focused Assessment []  - Circumferential Edema Measurements - multi extremities 0 []  - 0 Nutritional Assessment / Counseling / Intervention X- 1 5 Lower Extremity Assessment (monofilament, tuning fork, pulses) []  - 0 Peripheral Arterial Disease Assessment (using hand held doppler) ASSESSMENTS - Ostomy and/or Continence Assessment and Care []  - Incontinence Assessment and Management 0 []  - 0 Ostomy Care Assessment and Management (repouching, etc.) PROCESS - Coordination of Care X - Simple Patient / Family Education for ongoing care 1 15 []  - 0 Complex (extensive) Patient / Family Education for ongoing care X- 1 10 Staff obtains Programmer, systems, Records, Test Results / Process Orders []  - 0 Staff telephones HHA, Nursing Homes / Clarify orders / etc []  - 0 Routine Transfer to another Facility (non-emergent condition) []  - 0 Routine Hospital Admission (non-emergent condition) []  - 0 New Admissions / Biomedical engineer / Ordering NPWT, Apligraf, etc. []  - 0 Emergency Hospital Admission (emergent condition) X- 1 10 Simple Discharge Coordination [name], [name] ([id number]) []  - 0 Complex (extensive) Discharge Coordination PROCESS - Special Needs []  - Pediatric / Minor Patient Management 0 []  - 0 Isolation Patient Management []  - 0 Hearing / Language / Visual special needs []  - 0 Assessment of Community assistance (transportation, D/C planning, etc.) []  - 0 Additional assistance / Altered mentation []  -  0 Support Surface(s) Assessment (bed, cushion, seat, etc.) INTERVENTIONS - Wound Cleansing / Measurement X - Simple Wound Cleansing - one wound 1 5 []  - 0 Complex Wound Cleansing - multiple wounds  X- 1 5 Wound Imaging (photographs - any number of wounds) []  - 0 Wound Tracing (instead of photographs) X- 1 5 Simple Wound Measurement - one wound []  - 0 Complex Wound Measurement - multiple wounds INTERVENTIONS - Wound Dressings X - Small Wound Dressing one or multiple wounds 1 10 []  - 0 Medium Wound Dressing one or multiple wounds []  - 0 Large Wound Dressing one or multiple wounds X- 1 5 Application of Medications - topical []  - 0 Application of Medications - injection INTERVENTIONS - Miscellaneous []  - External ear exam 0 []  - 0 Specimen Collection (cultures, biopsies, blood, body fluids, etc.) []  - 0 Specimen(s) / Culture(s) sent or taken to Lab for analysis []  - 0 Patient Transfer (multiple staff / Civil Service fast streamer / Similar devices) []  - 0 Simple Staple / Suture removal (25 or less) []  - 0 Complex Staple / Suture removal (26 or more) []  - 0 Hypo / Hyperglycemic Management (close monitor of Blood Glucose) []  - 0 Ankle / Brachial Index (ABI) - do not check if billed separately X- 1 5 Vital Signs [name], [name] ([id number]) Has the patient been seen at the hospital within the last three years: Yes Total Score: 95 Level Of Care: New/Established - Level 3 Electronic Signature(s) Signed: 08/14/2018 4:10:04 PM By: Montey Hora Entered By: Montey Hora on 08/14/2018 14:53:39 [name] ([id number]) -------------------------------------------------------------------------------- Encounter Discharge Information Details Patient Name: Tremont, Dannelle C. Date of Service: 08/14/2018 1:30 PM Medical Record Number: [id number] Patient Account Number: [id number] Date of Birth/Sex: [birth year] (85 y.o. F) Treating RN: Harold Barban Primary Care Geovanny Sartin: Ria Bush  Other Clinician: Referring Amaiah Cristiano: Ria Bush Treating Refugia Laneve/Extender: Melburn Hake, HOYT Weeks in Treatment: 20 Encounter Discharge Information Items Discharge Condition: Stable Ambulatory Status: Walker Discharge Destination: Home Transportation: Private Auto Accompanied By: self Schedule Follow-up Appointment: Yes Clinical Summary of Care: Electronic Signature(s) Signed: 08/14/2018 3:53:55 PM By: Harold Barban Entered By: Harold Barban on 08/14/2018 15:53:55 [name] ([id number]) -------------------------------------------------------------------------------- Lower Extremity Assessment Details Patient Name: Stotts, Violia C. Date of Service: 08/14/2018 1:30 PM Medical Record Number: [id number] Patient Account Number: [id number] Date of Birth/Sex: [birth year] (85 y.o. F) Treating RN: Cornell Barman Primary Care Larrie Fraizer: Ria Bush Other Clinician: Referring Kanai Berrios: Ria Bush Treating Hernandez Losasso/Extender: Melburn Hake, HOYT Weeks in Treatment: 20 Vascular Assessment Pulses: Dorsalis Pedis Palpable: [Right:Yes] Electronic Signature(s) Signed: 08/14/2018 2:00:59 PM By: Gretta Cool, BSN, RN, CWS, Kim RN, BSN Entered By: Gretta Cool, BSN, RN, CWS, Kim on 08/14/2018 14:00:58 [name] ([id number]) -------------------------------------------------------------------------------- Multi Wound Chart Details Patient Name: Sanjuan, Gyselle C. Date of Service: 08/14/2018 1:30 PM Medical Record Number: [id number] Patient Account Number: [id number] Date of Birth/Sex: 1928/01/24 (85 y.o. F) Treating RN: Montey Hora Primary Care Stachia Slutsky: Ria Bush Other Clinician: Referring Khyree Carillo: Ria Bush Treating Ginnie Marich/Extender: STONE III, HOYT Weeks in Treatment: 20 Vital Signs Height(in): 58 Pulse(bpm): 65 Weight(lbs): 140 Blood Pressure(mmHg): 143/93 Body Mass Index(BMI): 29 Temperature(F): 98.0 Respiratory Rate 16 (breaths/min): Photos:  [N/A:N/A] Wound Location: Right Lower Leg - Lateral N/A N/A Wounding Event: Trauma N/A N/A Primary Etiology: Diabetic Wound/Ulcer of the N/A N/A Lower Extremity Comorbid History: Cataracts, Anemia, N/A N/A Arrhythmia, Coronary Artery Disease, Hypertension, Myocardial Infarction, Type II Diabetes, Osteoarthritis Date Acquired: 03/09/2018 N/A N/A Weeks of Treatment: 20 N/A N/A Wound Status: Open N/A N/A Measurements L x W x D 2.4x1x0.1 N/A N/A (cm) Area (cm) : 1.885 N/A N/A Volume (cm) : 0.188 N/A N/A % Reduction in Area: 0.00% N/A N/A % Reduction in Volume: 66.70% N/A  N/A Classification: Grade 2 N/A N/A Exudate Amount: Medium N/A N/A Exudate Type: Serous N/A N/A Exudate Color: amber N/A N/A Wound Margin: Flat and Intact N/A N/A Granulation Amount: Small (1-33%) N/A N/A Granulation Quality: Pink N/A N/A Necrotic Amount: Medium (34-66%) N/A N/A Exposed Structures: Fat Layer (Subcutaneous N/A N/A Tissue) Exposed: Yes Fascia: No Nees, Marionna C. ([id number]) Tendon: No Muscle: No Joint: No Bone: No Epithelialization: Small (1-33%) N/A N/A Periwound Skin Texture: Excoriation: No N/A N/A Induration: No Callus: No Crepitus: No Rash: No Scarring: No Periwound Skin Moisture: Maceration: No N/A N/A Dry/Scaly: No Periwound Skin Color: Atrophie Blanche: No N/A N/A Cyanosis: No Ecchymosis: No Erythema: No Hemosiderin Staining: No Mottled: No Pallor: No Rubor: No Temperature: No Abnormality N/A N/A Tenderness on Palpation: Yes N/A N/A Treatment Notes Electronic Signature(s) Signed: 08/14/2018 4:10:04 PM By: Montey Hora Entered By: Montey Hora on 08/14/2018 14:51:13 Sassaman, [name] ([id number]) -------------------------------------------------------------------------------- [place] Details Patient Name: [name], [name] Date of Service: 08/14/2018 1:30 PM Medical Record Number: [id number] Patient Account Number: [id number] Date of  Birth/Sex: December 17, 1927 (85 y.o. F) Treating RN: Montey Hora Primary Care Sadeen Wiegel: Ria Bush Other Clinician: Referring Kyani Simkin: Ria Bush Treating Delylah Stanczyk/Extender: Melburn Hake, HOYT Weeks in Treatment: 20 Active Inactive Abuse / Safety / Falls / Self Care Management Nursing Diagnoses: Potential for falls Goals: Patient will not experience any injury related to falls Date Initiated: 03/23/2018 Target Resolution Date: 06/09/2018 Goal Status: Active Interventions: Assess fall risk on admission and as needed Notes: Orientation to the Wound Care Program Nursing Diagnoses: Knowledge deficit related to the wound healing center program Goals: Patient/caregiver will verbalize understanding of the [place] Program Date Initiated: 03/23/2018 Target Resolution Date: 06/09/2018 Goal Status: Active Interventions: Provide education on orientation to the wound center Notes: Pain, Acute or Chronic Nursing Diagnoses: Pain, acute or chronic: actual or potential Goals: Patient will verbalize adequate pain control and receive pain control interventions during procedures as needed Date Initiated: 03/23/2018 Target Resolution Date: 06/09/2018 Goal Status: Active Interventions: Assess comfort goal upon admission [name], [name] ([id number]) Notes: Wound/Skin Impairment Nursing Diagnoses: Impaired tissue integrity Goals: Ulcer/skin breakdown will heal within 14 weeks Date Initiated: 03/23/2018 Target Resolution Date: 06/09/2018 Goal Status: Active Interventions: Assess patient/caregiver ability to obtain necessary supplies Assess patient/caregiver ability to perform ulcer/skin care regimen upon admission and as needed Assess ulceration(s) every visit Notes: Electronic Signature(s) Signed: 08/14/2018 4:10:04 PM By: Montey Hora Entered By: Montey Hora on 08/14/2018 14:51:05 [name], [name]  ([id number]) -------------------------------------------------------------------------------- Pain Assessment Details Patient Name: Lalone, Hila C. Date of Service: 08/14/2018 1:30 PM Medical Record Number: [id number] Patient Account Number: [id number] Date of Birth/Sex: [birth year] (85 y.o. F) Treating RN: Montey Hora Primary Care Algie Cales: Ria Bush Other Clinician: Referring Crystallynn Noorani: Ria Bush Treating Ensley Blas/Extender: STONE III, HOYT Weeks in Treatment: 20 Active Problems Location of Pain Severity and Description of Pain Patient Has [name] Yes Site Locations Rate the pain. Current Pain Level: 4 Pain Management and Medication Current Pain Management: Electronic Signature(s) Signed: 08/14/2018 2:01:48 PM By: Lorine Bears RCP, RRT, CHT Signed: 08/14/2018 4:10:04 PM By: Montey Hora Entered By: Lorine Bears on 08/14/2018 13:49:09 Mcclintic, [name] ([id number]) -------------------------------------------------------------------------------- Patient/Caregiver Education Details Patient Name: [name], [name] Date of Service: 08/14/2018 1:30 PM Medical Record Number: [id number] Patient Account Number: [id number] Date of Birth/Gender: Aug 23, 1927 (85 y.o. F) Treating RN: Montey Hora Primary Care Physician: Ria Bush Other Clinician: Referring Physician: Ria Bush Treating Physician/Extender: Melburn Hake, HOYT Weeks in Treatment: 20 Education Assessment  Education Provided To: Patient Education Topics Provided Wound/Skin Impairment: Handouts: Other: wound care as ordered Methods: Demonstration, Explain/Verbal Responses: State content correctly Electronic Signature(s) Signed: 08/14/2018 4:10:04 PM By: Montey Hora Entered By: Montey Hora on 08/14/2018 14:53:57 [name], [name] ([id number]) -------------------------------------------------------------------------------- Wound Assessment Details Patient  Name: [name], [name] Date of Service: 08/14/2018 1:30 PM Medical Record Number: [id number] Patient Account Number: [id number] Date of Birth/Sex: Jun 22, 1927 (85 y.o. F) Treating RN: Cornell Barman Primary Care Bentley Haralson: Ria Bush Other Clinician: Referring Jonavin Seder: Ria Bush Treating Abriel Hattery/Extender: Melburn Hake, HOYT Weeks in Treatment: 20 Wound Status Wound Number: 8 Primary Diabetic Wound/Ulcer of the Lower Extremity Etiology: Wound Location: Right Lower Leg - Lateral Wound Open Wounding Event: Trauma Status: Date Acquired: 03/09/2018 Comorbid Cataracts, Anemia, Arrhythmia, Coronary Artery Weeks Of Treatment: 20 History: Disease, Hypertension, Myocardial Infarction, Clustered Wound: No Type II Diabetes, Osteoarthritis Photos Wound Measurements Length: (cm) 2.4 % Reduction in Width: (cm) 1 % Reduction in Depth: (cm) 0.1 Epithelializat Area: (cm) 1.885 Tunneling: Volume: (cm) 0.188 Undermining: Area: 0% Volume: 66.7% ion: Small (1-33%) No No Wound Description Classification: Grade 2 Foul Odor Afte Wound Margin: Flat and Intact Slough/Fibrino Exudate Amount: Medium Exudate Type: Serous Exudate Color: amber r Cleansing: No Yes Wound Bed Granulation Amount: Small (1-33%) Exposed Structure Granulation Quality: Pink Fascia Exposed: No Necrotic Amount: Medium (34-66%) Fat Layer (Subcutaneous Tissue) Exposed: Yes Necrotic Quality: Adherent Slough Tendon Exposed: No Muscle Exposed: No Joint Exposed: No Bone Exposed: No Periwound Skin Texture Texture Color Langenbach, Danamarie C. ([id number]) No Abnormalities Noted: No No Abnormalities Noted: No Callus: No Atrophie Blanche: No Crepitus: No Cyanosis: No Excoriation: No Ecchymosis: No Induration: No Erythema: No Rash: No Hemosiderin Staining: No Scarring: No Mottled: No Pallor: No Moisture Rubor: No No Abnormalities Noted: No Dry / Scaly: No Temperature / Pain Maceration: No Temperature: No  Abnormality Tenderness on Palpation: Yes Treatment Notes Wound #8 (Right, Lateral Lower Leg) Notes TCA, telfa, conform secured with tape. Patient refused Tubigrip-states she will not wear because she does not like it! Electronic Signature(s) Signed: 08/14/2018 2:00:21 PM By: Gretta Cool, BSN, RN, CWS, Kim RN, BSN Entered By: Gretta Cool, BSN, RN, CWS, Kim on 08/14/2018 14:00:21 Krauter, [name] ([id number]) -------------------------------------------------------------------------------- [place] Details Patient Name: [name], Llewellyn C. Date of Service: 08/14/2018 1:30 PM Medical Record Number: [id number] Patient Account Number: [id number] Date of Birth/Sex: June 05, 1927 (85 y.o. F) Treating RN: Cornell Barman Primary Care Ellamarie Naeve: Ria Bush Other Clinician: Referring Brenyn Petrey: Ria Bush Treating Gordan Grell/Extender: Melburn Hake, HOYT Weeks in Treatment: 20 Vital Signs Time Taken: 13:58 Temperature (F): 98.0 Height (in): 58 Pulse (bpm): 65 Weight (lbs): 140 Respiratory Rate (breaths/min): 16 Body Mass Index (BMI): 29.3 Blood Pressure (mmHg): 143/93 Reference Range: 80 - 120 mg / dl Electronic Signature(s) Signed: 08/14/2018 1:59:00 PM By: Gretta Cool, BSN, RN, CWS, Kim RN, BSN Entered By: Gretta Cool, BSN, RN, CWS, Kim on 08/14/2018 13:59:00

## 2018-08-16 NOTE — Progress Notes (Signed)
NELMA, PHAGAN ([id number]) Visit Report for 08/14/2018 Chief Complaint Document Details Patient Name: [name], [name] Date of Service: 08/14/2018 1:30 PM Medical Record Number: [id number] Patient Account Number: [id number] Date of Birth/Sex: 10/08/1927 (85 y.o. F) Treating RN: Montey Hora Primary Care Provider: Ria Bush Other Clinician: Referring Provider: Ria Bush Treating Provider/Extender: Melburn Hake, HOYT Weeks in Treatment: 20 Information Obtained from: Patient Chief Complaint RLE wounds Electronic Signature(s) Signed: 08/16/2018 3:58:08 AM By: Worthy Keeler PA-C Entered By: Worthy Keeler on 08/14/2018 13:18:05 [name] ([id number]) -------------------------------------------------------------------------------- HPI Details Patient Name: [name], [name] Date of Service: 08/14/2018 1:30 PM Medical Record Number: [id number] Patient Account Number: [id number] Date of Birth/Sex: [birth year] (85 y.o. F) Treating RN: Montey Hora Primary Care Provider: Ria Bush Other Clinician: Referring Provider: Ria Bush Treating Provider/Extender: Melburn Hake, HOYT Weeks in Treatment: 20 History of Present Illness HPI Description: Readmission: 03/23/18 on evaluation today patient presents for readmission entire clinic concerning issues that she has been having for the past couple of weeks in regard to her right lower extremity. The medial injury actually occurred when her dog tried to take a blanket from her and subsequently scratched her with his toenail. Subsequently the lateral injury was a wound where she dropped a knife in her kitchen and hit the side of her leg. With that being said both have been open for some time and home health nurse that has been coming out marked for her where the redness was several days ago. Nonetheless this has spread will be on the margin of what was marked. She was placed on Keflex which was started two days ago  but at this point it does not seem to have helped with any improvement in the overall appearance of the wounds. No fevers, chills, nausea, or vomiting noted at this time. 04/02/18 Seen today for follow up and management of right lower extremity wounds. Right lateral wound with adherent slough. She is complaining of pain to touch which limited exam. Recently treated in the ED for cellulitis and wound culture positive for MRSA. Completed a dose of Vancomycin and transitioned to doxycycline by mouth. Erythema remains present with improvement since last visit. Right medial wound with scab present. No fevers, chills, nausea, or vomiting noted at this time. 04/10/18 on evaluation today patient appears to be doing a Xue better in regard to her right lateral lower [name]. The slough does seem to be coming off although it is slow from the wound bed. Fortunately there does not appear to be any evidence of infection at this time which is good news. With that being said I do believe that she is going to require longer treatment with the Santyl simply due to the fact that I'm not able to really debride the wound due to the pain that she is experiencing. 04/16/18 upon evaluation today patient's wound bed currently on the lateral lower extremity on the right appears to be doing a Kushner better at this point. We are utilizing sensible currently. Overall I'm very pleased with how things are progressing although there still is some work to do before this will be completely healed. No fevers, chills, nausea, or vomiting noted at this time. 04/23/18 on evaluation today patient appears to be doing about the same at this point maybe just a Crisafulli bit smaller than last week's evaluation. She continues to have some slough buildup on the surface of the wound and has very Donald granulation unfortunately. This seems to be very slow moving  I'm getting somewhat concerned about the possibility of her having decreased  blood flow which is leading to her subsequently not being able to heal this area as quickly as she shut otherwise. She is currently utilizing Santyl. 05/01/18 on evaluation today patient appears to be doing somewhat poorly in regard to her lower extremity ulcer. At this point she's been tolerating the dressing changes to some degree without complication. With that being said she does note of the past several days that she has been having more pain unfortunately. On inspection it appears that she may have signs of infection which would account for the pain. 05/07/18 on evaluation today patient's wound actually appears to be doing better she's not having as much erythema in the right lower extremity as compared to last week. Fortunately there's no signs of infection this is good news as well. Overall very pleased with the progress that she has made up to this point. 05/14/18 on evaluation today patient actually appears to be doing rather well in regard to her right lower extremity ulcer. She does not have erythema that was characterizing the cellulitis prior to initiation of antibiotic therapy. With that being said her wound still appears to be somewhat slow in healing I believe this may be arterial in nature that is my concern at least. With that being said I do want her to see the vascular specialist as soon as possible we been trying to get this set up I'm not sure if she's not answering the phone or what exactly is going on here. SMT, LOKEY ([id number]) 05/21/18 on evaluation today patient's wound actually appears to be doing a Mondesir better possibly some additional granulation compared to my last evaluation. With that being said she still continues to have a lot of pain especially when her legs are elevated such as sitting in the chair here in the office. There does not appear to be any signs of infection which is good news. 05/28/18 on evaluation today patient appears to be doing a Guidice  better in regard to her right lateral lower [name]. Again she was supposed to have an appointment with vascular unfortunately they attempted to call her three times and unfortunately were unable to get anything scheduled. That reason closed up for him. She tells me that if the phone is answered and she doesn't recognize what is that she will hang up as well those that she lives with. Obviously she's not gonna recognize somebody calling from the office that she has not previously spoken with. Nonetheless the patient states that she does not remember any fall. I think if we're gonna get her in with vascular specialist we're gonna have to try to make the appointment for her. 06/04/2018 Seen today for follow up and management of right lateral lower extremity wound. Adherent slough present over surface of wound. Scheduled to follow up for ABI/TBI on 06/06/2018 with vein and vascular specialist. She is hard of hearing. Wound is sensitive to touch. No signs or symptoms of infection. Denies any recent fevers, chills, or SOB. 06/11/18 on evaluation today patient actually appears to be doing very well in regard to her right lateral lower extremity ulcer. She's been tolerating the dressing changes without complication. Fortunately there's no evidence of infection. With that being said she did have her arterial studies which revealed that she had a very low ABI which was 0.67 on the right with a TBI of 0.32 and 0.65 on the left with a TBI of 0.16. I  do believe she needs to see vascular and we are working on that referral as well. 06/21/18 on evaluation today patient's wound actually appears to be doing okay she does have an appointment with vascular coming up on the 24th. Nonetheless she's been having some issues that are more urinary in nature right now that are causing trouble for her. Fortunately there's no signs of severe sepsis although she is running a low-grade temperature 99.9 on evaluation today. She  also is having some issues right now with chills as well as urinary urgency and frequency. Again I feel like the city is more urinary and symptomology I do not believe she has anything upper respiratory which is good news. Nonetheless I think this may need to be checked out more quickly especially in light of the fact that she's having significant back pain at this point. 07/02/18 on evaluation today patient appears to be doing okay in regard to her ulcer on the right lower extremity. She's been tolerating the dressing changes without complication. Fortunately there does not appear to be any signs of infection which is good news. No fevers, chills, nausea, or vomiting noted at this time. 07/10/18 on evaluation today patient appears to be doing well in regard to her right lateral lower extremity ulcer all things considering. She's been using this form this seems to be the one thing that she can really do on her own and she seems to be making some progress obviously help with this matter. I think the biggest issue limiting her feeling speed is her blood flow she canceled her original appointment with vascular her next one is not till May. She was worried about the Covid-19 Virus and having to see another physician. Nonetheless there's no signs of active infection at this time. 07/16/18 on evaluation today patient appears to be doing okay in regard to her right lateral lower extremity ulcer. She's been tolerating the dressing changes without complication. Fortunately there's no signs of active infection at this time. No fevers, chills, nausea, or vomiting noted at this time. 07/23/18 on evaluation today patient appears to be doing very well in regard to her right lateral load should be ulcer all things considering. She still again is not seeing vascular until mid May in the meantime she's been utilizing the over-the-counter tripling about appointment which seems to be the only thing she's able to do  routinely on her own without getting confused or frustrated. Overall it seems to be doing okay fortunately she's showing no signs of infection. 07/30/18 upon evaluation today patient actually appears to be doing about the same in regard to her wound in general. She is making very slow progress most likely again due to the issue with her blood flow she sees vascular towards the middle of news. In the meantime we've really been trying to just keep things in the control from the infection standpoint and allow for any healing that could take place up until appointment she can see vascular. 08/06/18 upon evaluation today patient's wound actually appears to be doing about the same although the wound bed may show Kanno bit more granulation which is good news. Fortunately there's no signs of active infection at this time. No fevers, chills, nausea, or vomiting noted at this time. 08/14/18 on evaluation today patient actually appears to be doing about the same in regard to her lower extremity ulcer. [name], [name] ([id number]) Fortunately there's no signs of active infection at this time which is good news. Fortunately the patient  has been tolerating the dressing changes without complication. No fevers, chills, nausea, or vomiting noted at this time. She does have her appointment with vascular next week. Electronic Signature(s) Signed: 08/16/2018 3:58:08 AM By: Worthy Keeler PA-C Entered By: Worthy Keeler on 08/16/2018 03:25:35 Vantrease, [name] ([id number]) -------------------------------------------------------------------------------- Physical Exam Details Patient Name: [name], [name] Date of Service: 08/14/2018 1:30 PM Medical Record Number: [id number] Patient Account Number: [id number] Date of Birth/Sex: 1927/10/27 (85 y.o. F) Treating RN: Montey Hora Primary Care Provider: Ria Bush Other Clinician: Referring Provider: Ria Bush Treating Provider/Extender: STONE III,  HOYT Weeks in Treatment: 37 Constitutional Well-nourished and well-hydrated in no acute distress. Respiratory normal breathing without difficulty. clear to auscultation bilaterally. Cardiovascular regular rate and rhythm with normal S1, S2. Psychiatric this patient is able to make decisions and demonstrates good insight into disease process. Alert and Oriented x 3. pleasant and cooperative. Notes Patient's wound bed currently showed signs of good granulation at this time there does not appear to be any signs of active infection which is good news. Overall I think she's making progress albeit slow at this time. Electronic Signature(s) Signed: 08/16/2018 3:58:08 AM By: Worthy Keeler PA-C Entered By: Worthy Keeler on 08/16/2018 03:26:07 [name] ([id number]) -------------------------------------------------------------------------------- Physician Orders Details Patient Name: [name], [name] Date of Service: 08/14/2018 1:30 PM Medical Record Number: [id number] Patient Account Number: [id number] Date of Birth/Sex: March 25, 1928 (85 y.o. F) Treating RN: Montey Hora Primary Care Provider: Ria Bush Other Clinician: Referring Provider: Ria Bush Treating Provider/Extender: Melburn Hake, HOYT Weeks in Treatment: 20 Verbal / Phone Orders: No Diagnosis Coding ICD-10 Coding Code Description I87.331 Chronic venous hypertension (idiopathic) with ulcer and inflammation of right lower extremity E11.622 Type 2 diabetes mellitus with other skin ulcer L97.812 Non-pressure chronic ulcer of other part of right lower leg with fat layer exposed L03.115 Cellulitis of right lower limb Wound Cleansing Wound #8 Right,Lateral Lower Leg o Clean wound with Normal Saline. Anesthetic (add to Medication List) Wound #8 Right,Lateral Lower Leg o Topical Lidocaine 4% cream applied to wound bed prior to debridement (In Clinic Only). Primary Wound Dressing Wound #8 Right,Lateral  Lower Leg o Other: - Antibiotic ointment Secondary Dressing Wound #8 Right,Lateral Lower Leg o ABD and Kerlix/Conform o Non-adherent pad Dressing Change Frequency Wound #8 Right,Lateral Lower Leg o Change dressing every day. Follow-up Appointments Wound #8 Right,Lateral Lower Leg o Return Appointment in 1 week. Edema Control Wound #8 Right,Lateral Lower Leg o Elevate legs to the level of the heart and pump ankles as often as possible o Other: - tubigrip Electronic Signature(s) Signed: 08/14/2018 4:10:04 PM By: Joanie Coddington ([id number]) Signed: 08/16/2018 3:58:08 AM By: Worthy Keeler PA-C Entered By: Montey Hora on 08/14/2018 14:52:33 [name], [name] ([id number]) -------------------------------------------------------------------------------- Problem List Details Patient Name: [name], [name] Date of Service: 08/14/2018 1:30 PM Medical Record Number: [id number] Patient Account Number: [id number] Date of Birth/Sex: Oct 15, 1927 (85 y.o. F) Treating RN: Montey Hora Primary Care Provider: Ria Bush Other Clinician: Referring Provider: Ria Bush Treating Provider/Extender: Melburn Hake, HOYT Weeks in Treatment: 20 Active Problems ICD-10 Evaluated Encounter Code Description Active Date Today Diagnosis I87.331 Chronic venous hypertension (idiopathic) with ulcer and 03/23/2018 No Yes inflammation of right lower extremity E11.622 Type 2 diabetes mellitus with other skin ulcer 03/23/2018 No Yes L97.812 Non-pressure chronic ulcer of other part of right lower leg 03/23/2018 No Yes with fat layer exposed L03.115 Cellulitis of right lower limb 03/23/2018 No Yes Inactive Problems Resolved Problems  Electronic Signature(s) Signed: 08/16/2018 3:58:08 AM By: Worthy Keeler PA-C Entered By: Worthy Keeler on 08/14/2018 13:18:01 Mengel, [name]  ([id number]) -------------------------------------------------------------------------------- Progress Note Details Patient Name: [name], [name] Date of Service: 08/14/2018 1:30 PM Medical Record Number: [id number] Patient Account Number: [id number] Date of Birth/Sex: 1927/06/13 (85 y.o. F) Treating RN: Montey Hora Primary Care Provider: Ria Bush Other Clinician: Referring Provider: Ria Bush Treating Provider/Extender: Melburn Hake, HOYT Weeks in Treatment: 20 Subjective Chief Complaint Information obtained from Patient RLE wounds History of Present Illness (HPI) Readmission: 03/23/18 on evaluation today patient presents for readmission entire clinic concerning issues that she has been having for the past couple of weeks in regard to her right lower extremity. The medial injury actually occurred when her dog tried to take a blanket from her and subsequently scratched her with his toenail. Subsequently the lateral injury was a wound where she dropped a knife in her kitchen and hit the side of her leg. With that being said both have been open for some time and home health nurse that has been coming out marked for her where the redness was several days ago. Nonetheless this has spread will be on the margin of what was marked. She was placed on Keflex which was started two days ago but at this point it does not seem to have helped with any improvement in the overall appearance of the wounds. No fevers, chills, nausea, or vomiting noted at this time. 04/02/18 Seen today for follow up and management of right lower extremity wounds. Right lateral wound with adherent slough. She is complaining of pain to touch which limited exam. Recently treated in the ED for cellulitis and wound culture positive for MRSA. Completed a dose of Vancomycin and transitioned to doxycycline by mouth. Erythema remains present with improvement since last visit. Right medial wound with scab present. No  fevers, chills, nausea, or vomiting noted at this time. 04/10/18 on evaluation today patient appears to be doing a Zaragoza better in regard to her right lateral lower [name]. The slough does seem to be coming off although it is slow from the wound bed. Fortunately there does not appear to be any evidence of infection at this time which is good news. With that being said I do believe that she is going to require longer treatment with the Santyl simply due to the fact that I'm not able to really debride the wound due to the pain that she is experiencing. 04/16/18 upon evaluation today patient's wound bed currently on the lateral lower extremity on the right appears to be doing a Fedrick better at this point. We are utilizing sensible currently. Overall I'm very pleased with how things are progressing although there still is some work to do before this will be completely healed. No fevers, chills, nausea, or vomiting noted at this time. 04/23/18 on evaluation today patient appears to be doing about the same at this point maybe just a Bellotti bit smaller than last week's evaluation. She continues to have some slough buildup on the surface of the wound and has very [place] granulation unfortunately. This seems to be very slow moving I'm getting somewhat concerned about the possibility of her having decreased blood flow which is leading to her subsequently not being able to heal this area as quickly as she shut otherwise. She is currently utilizing Santyl. 05/01/18 on evaluation today patient appears to be doing somewhat poorly in regard to her lower extremity ulcer. At this point she's been  tolerating the dressing changes to some degree without complication. With that being said she does note of the past several days that she has been having more pain unfortunately. On inspection it appears that she may have signs of infection which would account for the pain. 05/07/18 on evaluation today patient's wound  actually appears to be doing better she's not having as much erythema in the right lower extremity as compared to last week. Fortunately there's no signs of infection this is good news as well. Overall very pleased with the progress that she has made up to this point. KAYLER, BUCKHOLTZ ([id number]) 05/14/18 on evaluation today patient actually appears to be doing rather well in regard to her right lower extremity ulcer. She does not have erythema that was characterizing the cellulitis prior to initiation of antibiotic therapy. With that being said her wound still appears to be somewhat slow in healing I believe this may be arterial in nature that is my concern at least. With that being said I do want her to see the vascular specialist as soon as possible we been trying to get this set up I'm not sure if she's not answering the phone or what exactly is going on here. 05/21/18 on evaluation today patient's wound actually appears to be doing a Yebra better possibly some additional granulation compared to my last evaluation. With that being said she still continues to have a lot of pain especially when her legs are elevated such as sitting in the chair here in the office. There does not appear to be any signs of infection which is good news. 05/28/18 on evaluation today patient appears to be doing a Lieb better in regard to her right lateral lower [name]. Again she was supposed to have an appointment with vascular unfortunately they attempted to call her three times and unfortunately were unable to get anything scheduled. That reason closed up for him. She tells me that if the phone is answered and she doesn't recognize what is that she will hang up as well those that she lives with. Obviously she's not gonna recognize somebody calling from the office that she has not previously spoken with. Nonetheless the patient states that she does not remember any fall. I think if we're gonna get her in with  vascular specialist we're gonna have to try to make the appointment for her. 06/04/2018 Seen today for follow up and management of right lateral lower extremity wound. Adherent slough present over surface of wound. Scheduled to follow up for ABI/TBI on 06/06/2018 with vein and vascular specialist. She is hard of hearing. Wound is sensitive to touch. No signs or symptoms of infection. Denies any recent fevers, chills, or SOB. 06/11/18 on evaluation today patient actually appears to be doing very well in regard to her right lateral lower extremity ulcer. She's been tolerating the dressing changes without complication. Fortunately there's no evidence of infection. With that being said she did have her arterial studies which revealed that she had a very low ABI which was 0.67 on the right with a TBI of 0.32 and 0.65 on the left with a TBI of 0.16. I do believe she needs to see vascular and we are working on that referral as well. 06/21/18 on evaluation today patient's wound actually appears to be doing okay she does have an appointment with vascular coming up on the 24th. Nonetheless she's been having some issues that are more urinary in nature right now that are causing trouble for her.  Fortunately there's no signs of severe sepsis although she is running a low-grade temperature 99.9 on evaluation today. She also is having some issues right now with chills as well as urinary urgency and frequency. Again I feel like the city is more urinary and symptomology I do not believe she has anything upper respiratory which is good news. Nonetheless I think this may need to be checked out more quickly especially in light of the fact that she's having significant back pain at this point. 07/02/18 on evaluation today patient appears to be doing okay in regard to her ulcer on the right lower extremity. She's been tolerating the dressing changes without complication. Fortunately there does not appear to be any signs of  infection which is good news. No fevers, chills, nausea, or vomiting noted at this time. 07/10/18 on evaluation today patient appears to be doing well in regard to her right lateral lower extremity ulcer all things considering. She's been using this form this seems to be the one thing that she can really do on her own and she seems to be making some progress obviously help with this matter. I think the biggest issue limiting her feeling speed is her blood flow she canceled her original appointment with vascular her next one is not till May. She was worried about the Covid-19 Virus and having to see another physician. Nonetheless there's no signs of active infection at this time. 07/16/18 on evaluation today patient appears to be doing okay in regard to her right lateral lower extremity ulcer. She's been tolerating the dressing changes without complication. Fortunately there's no signs of active infection at this time. No fevers, chills, nausea, or vomiting noted at this time. 07/23/18 on evaluation today patient appears to be doing very well in regard to her right lateral load should be ulcer all things considering. She still again is not seeing vascular until mid May in the meantime she's been utilizing the over-the-counter tripling about appointment which seems to be the only thing she's able to do routinely on her own without getting confused or frustrated. Overall it seems to be doing okay fortunately she's showing no signs of infection. 07/30/18 upon evaluation today patient actually appears to be doing about the same in regard to her wound in general. She is making very slow progress most likely again due to the issue with her blood flow she sees vascular towards the middle of news. In the meantime we've really been trying to just keep things in the control from the infection standpoint and allow for any healing that could take place up until appointment she can see vascular. BRIDGITTE, FELICETTI  ([id number]) 08/06/18 upon evaluation today patient's wound actually appears to be doing about the same although the wound bed may show [place] bit more granulation which is good news. Fortunately there's no signs of active infection at this time. No fevers, chills, nausea, or vomiting noted at this time. 08/14/18 on evaluation today patient actually appears to be doing about the same in regard to her lower extremity ulcer. Fortunately there's no signs of active infection at this time which is good news. Fortunately the patient has been tolerating the dressing changes without complication. No fevers, chills, nausea, or vomiting noted at this time. She does have her appointment with vascular next week. Patient History Information obtained from Patient. Family History Diabetes - Father,Siblings,Child, Hypertension - Child, Lung Disease - Child, Stroke - Mother, No family history of Cancer, Heart Disease, Kidney Disease, Seizures, Thyroid Problems.  Social History Former smoker, Marital Status - Single, Alcohol Use - Never, Drug Use - No History, Caffeine Use - Daily. Medical History Eyes Patient has history of Cataracts - removed Denies history of Glaucoma, Optic Neuritis Hematologic/Lymphatic Patient has history of Anemia Denies history of Hemophilia, Human Immunodeficiency Virus, Lymphedema, Sickle Cell Disease Respiratory Denies history of Aspiration, Asthma, Chronic Obstructive Pulmonary Disease (COPD), Pneumothorax, Sleep Apnea, Tuberculosis Cardiovascular Patient has history of Arrhythmia, Coronary Artery Disease, Hypertension, Myocardial Infarction Denies history of Angina, Congestive Heart Failure, Deep Vein Thrombosis, Hypotension, Peripheral Arterial Disease, Peripheral Venous Disease, Phlebitis Endocrine Patient has history of Type II Diabetes Denies history of Type I Diabetes Genitourinary Denies history of End Stage Renal Disease Immunological Denies history of Lupus  Erythematosus, Raynaud s, Scleroderma Integumentary (Skin) Denies history of History of Burn, History of pressure wounds Musculoskeletal Patient has history of Osteoarthritis Denies history of Gout, Rheumatoid Arthritis, Osteomyelitis Neurologic Denies history of Dementia, Neuropathy, Quadriplegia, Paraplegia, Seizure Disorder Oncologic Denies history of Received Chemotherapy, Received Radiation Medical And Surgical History Notes Constitutional Symptoms (General Health) High Blood Pressure, open heart surgery, gall stone, hysterectomy; Fluid pill, Anxiety Review of Systems (ROS) Constitutional Symptoms (General Health) Denies complaints or symptoms of Fatigue, Fever, Chills, Marked Weight Change. Respiratory Snelson, [name] ([id number]) Denies complaints or symptoms of Chronic or frequent coughs, Shortness of Breath. Cardiovascular Denies complaints or symptoms of Chest pain, LE edema. Psychiatric Denies complaints or symptoms of Anxiety, Claustrophobia. Objective Constitutional Well-nourished and well-hydrated in no acute distress. Vitals Time Taken: 1:58 PM, Height: 58 in, Weight: 140 lbs, BMI: 29.3, Temperature: 98.0 F, Pulse: 65 bpm, Respiratory Rate: 16 breaths/min, Blood Pressure: 143/93 mmHg. Respiratory normal breathing without difficulty. clear to auscultation bilaterally. Cardiovascular regular rate and rhythm with normal S1, S2. Psychiatric this patient is able to make decisions and demonstrates good insight into disease process. Alert and Oriented x 3. pleasant and cooperative. General Notes: Patient's wound bed currently showed signs of good granulation at this time there does not appear to be any signs of active infection which is good news. Overall I think she's making progress albeit slow at this time. Integumentary (Hair, Skin) Wound #8 status is Open. Original cause of wound was Trauma. The wound is located on the Right,Lateral Lower Leg. The wound  measures 2.4cm length x 1cm width x 0.1cm depth; 1.885cm^2 area and 0.188cm^3 volume. There is Fat Layer (Subcutaneous Tissue) Exposed exposed. There is no tunneling or undermining noted. There is a medium amount of serous drainage noted. The wound margin is flat and intact. There is small (1-33%) pink granulation within the wound bed. There is a medium (34-66%) amount of necrotic tissue within the wound bed including Adherent Slough. The periwound skin appearance did not exhibit: Callus, Crepitus, Excoriation, Induration, Rash, Scarring, Dry/Scaly, Maceration, Atrophie Blanche, Cyanosis, Ecchymosis, Hemosiderin Staining, Mottled, Pallor, Rubor, Erythema. Periwound temperature was noted as No Abnormality. The periwound has tenderness on palpation. Assessment Active Problems ICD-10 Chronic venous hypertension (idiopathic) with ulcer and inflammation of right lower extremity Type 2 diabetes mellitus with other skin ulcer Non-pressure chronic ulcer of other part of right lower leg with fat layer exposed [name], [name] ([id number]) Cellulitis of right lower limb Plan Wound Cleansing: Wound #8 Right,Lateral Lower Leg: Clean wound with Normal Saline. Anesthetic (add to Medication List): Wound #8 Right,Lateral Lower Leg: Topical Lidocaine 4% cream applied to wound bed prior to debridement (In Clinic Only). Primary Wound Dressing: Wound #8 Right,Lateral Lower Leg: Other: - Antibiotic ointment Secondary Dressing: Wound #  8 Right,Lateral Lower Leg: ABD and Kerlix/Conform Non-adherent pad Dressing Change Frequency: Wound #8 Right,Lateral Lower Leg: Change dressing every day. Follow-up Appointments: Wound #8 Right,Lateral Lower Leg: Return Appointment in 1 week. Edema Control: Wound #8 Right,Lateral Lower Leg: Elevate legs to the level of the heart and pump ankles as often as possible Other: - tubigrip My suggestion is gonna be that we continue with the above wound care measures as again  it seems to be the only thing she can really do on her own that she's able to keep up with. We will subsequently see were things that at follow-up after her appointment with vascular. Please see above for specific wound care orders. We will see patient for re-evaluation in 1 week(s) here in the clinic. If anything worsens or changes patient will contact our office for additional recommendations. Electronic Signature(s) Signed: 08/16/2018 3:58:08 AM By: Worthy Keeler PA-C Entered By: Worthy Keeler on 08/16/2018 03:26:23 [name] ([id number]) -------------------------------------------------------------------------------- ROS/PFSH Details Patient Name: [name], [name] Date of Service: 08/14/2018 1:30 PM Medical Record Number: [id number] Patient Account Number: [id number] Date of Birth/Sex: 07/21/1927 (85 y.o. F) Treating RN: Montey Hora Primary Care Provider: Ria Bush Other Clinician: Referring Provider: Ria Bush Treating Provider/Extender: STONE III, HOYT Weeks in Treatment: 20 Information Obtained From Patient Constitutional Symptoms (General Health) Complaints and Symptoms: Negative for: Fatigue; Fever; Chills; Marked Weight Change Medical History: Past Medical History Notes: High Blood Pressure, open heart surgery, gall stone, hysterectomy; Fluid pill, Anxiety Respiratory Complaints and Symptoms: Negative for: Chronic or frequent coughs; Shortness of Breath Medical History: Negative for: Aspiration; Asthma; Chronic Obstructive Pulmonary Disease (COPD); Pneumothorax; Sleep Apnea; Tuberculosis Cardiovascular Complaints and Symptoms: Negative for: Chest pain; LE edema Medical History: Positive for: Arrhythmia; Coronary Artery Disease; Hypertension; Myocardial Infarction Negative for: Angina; Congestive Heart Failure; Deep Vein Thrombosis; Hypotension; Peripheral Arterial Disease; Peripheral Venous Disease; Phlebitis Psychiatric Complaints and  Symptoms: Negative for: Anxiety; Claustrophobia Eyes Medical History: Positive for: Cataracts - removed Negative for: Glaucoma; Optic Neuritis Hematologic/Lymphatic Medical History: Positive for: Anemia Negative for: Hemophilia; Human Immunodeficiency Virus; Lymphedema; Sickle Cell Disease Endocrine KITANA, GAGE ([id number]) Medical History: Positive for: Type II Diabetes Negative for: Type I Diabetes Time with diabetes: no idea Treated with: Oral agents Blood sugar tested every day: No Genitourinary Medical History: Negative for: End Stage Renal Disease Immunological Medical History: Negative for: Lupus Erythematosus; Raynaudos; Scleroderma Integumentary (Skin) Medical History: Negative for: History of Burn; History of pressure wounds Musculoskeletal Medical History: Positive for: Osteoarthritis Negative for: Gout; Rheumatoid Arthritis; Osteomyelitis Neurologic Medical History: Negative for: Dementia; Neuropathy; Quadriplegia; Paraplegia; Seizure Disorder Oncologic Medical History: Negative for: Received Chemotherapy; Received Radiation HBO Extended History Items Eyes: Cataracts Immunizations Pneumococcal Vaccine: Received Pneumococcal Vaccination: Yes Implantable Devices No devices added Family and Social History Cancer: No; Diabetes: Yes - Father,Siblings,Child; Heart Disease: No; Hypertension: Yes - Child; Kidney Disease: No; Lung Disease: Yes - Child; Seizures: No; Stroke: Yes - Mother; Thyroid Problems: No; Former smoker; Marital Status - Single; Alcohol Use: Never; Drug Use: No History; Caffeine Use: Daily; Financial Concerns: No; Food, Clothing or Shelter Needs: No; Support System Lacking: No; Transportation Concerns: No Physician Affirmation I have reviewed and agree with the above information. GRACLYNN, VANANTWERP ([id number]) Electronic Signature(s) Signed: 08/16/2018 3:58:08 AM By: Worthy Keeler PA-C Signed: 08/16/2018 1:37:45 PM By: Montey Hora Entered By: Worthy Keeler on 08/16/2018 03:25:53 Shuster, [name] ([id number]) -------------------------------------------------------------------------------- SuperBill Details Patient Name: Scobey, Makhayla C. Date of Service: 08/14/2018 Medical Record  Number: [id number] Patient Account Number: [id number] Date of Birth/Sex: 1927-08-22 (85 y.o. F) Treating RN: Montey Hora Primary Care Provider: Ria Bush Other Clinician: Referring Provider: Ria Bush Treating Provider/Extender: Melburn Hake, HOYT Weeks in Treatment: 20 Diagnosis Coding ICD-10 Codes Code Description I87.331 Chronic venous hypertension (idiopathic) with ulcer and inflammation of right lower extremity E11.622 Type 2 diabetes mellitus with other skin ulcer L97.812 Non-pressure chronic ulcer of other part of right lower leg with fat layer exposed L03.115 Cellulitis of right lower limb Facility Procedures CPT4 Code: 44514604 Description: 99213 - WOUND CARE VISIT-LEV 3 EST PT Modifier: Quantity: 1 Physician Procedures CPT4: Description Modifier Quantity Code [id number] 99214 - WC PHYS LEVEL 4 - EST PT 1 ICD-10 Diagnosis Description I87.331 Chronic venous hypertension (idiopathic) with ulcer and inflammation of right lower extremity E11.622 Type 2 diabetes mellitus with  other skin ulcer L97.812 Non-pressure chronic ulcer of other part of right lower leg with fat layer exposed L03.115 Cellulitis of right lower limb Electronic Signature(s) Signed: 08/16/2018 3:58:08 AM By: Worthy Keeler PA-C Entered By: Worthy Keeler on 08/16/2018 03:26:37

## 2018-08-20 ENCOUNTER — Encounter: Payer: Medicare Other | Admitting: Physician Assistant

## 2018-08-20 ENCOUNTER — Other Ambulatory Visit: Payer: Self-pay

## 2018-08-20 DIAGNOSIS — L03115 Cellulitis of right lower limb: Secondary | ICD-10-CM | POA: Diagnosis not present

## 2018-08-20 DIAGNOSIS — I10 Essential (primary) hypertension: Secondary | ICD-10-CM | POA: Diagnosis not present

## 2018-08-20 DIAGNOSIS — E11622 Type 2 diabetes mellitus with other skin ulcer: Secondary | ICD-10-CM | POA: Diagnosis not present

## 2018-08-20 DIAGNOSIS — Z7984 Long term (current) use of oral hypoglycemic drugs: Secondary | ICD-10-CM | POA: Diagnosis not present

## 2018-08-20 DIAGNOSIS — L97812 Non-pressure chronic ulcer of other part of right lower leg with fat layer exposed: Secondary | ICD-10-CM | POA: Diagnosis not present

## 2018-08-20 DIAGNOSIS — I252 Old myocardial infarction: Secondary | ICD-10-CM | POA: Diagnosis not present

## 2018-08-21 ENCOUNTER — Encounter (INDEPENDENT_AMBULATORY_CARE_PROVIDER_SITE_OTHER): Payer: Self-pay | Admitting: Vascular Surgery

## 2018-08-21 ENCOUNTER — Ambulatory Visit (INDEPENDENT_AMBULATORY_CARE_PROVIDER_SITE_OTHER): Payer: Medicare Other | Admitting: Vascular Surgery

## 2018-08-21 ENCOUNTER — Other Ambulatory Visit: Payer: Self-pay

## 2018-08-21 VITALS — BP 151/74 | HR 78 | Resp 12 | Ht <= 58 in | Wt 136.0 lb

## 2018-08-21 DIAGNOSIS — I10 Essential (primary) hypertension: Secondary | ICD-10-CM | POA: Diagnosis not present

## 2018-08-21 DIAGNOSIS — I70232 Atherosclerosis of native arteries of right leg with ulceration of calf: Secondary | ICD-10-CM | POA: Diagnosis not present

## 2018-08-21 DIAGNOSIS — I7025 Atherosclerosis of native arteries of other extremities with ulceration: Secondary | ICD-10-CM

## 2018-08-21 DIAGNOSIS — E785 Hyperlipidemia, unspecified: Secondary | ICD-10-CM | POA: Diagnosis not present

## 2018-08-21 DIAGNOSIS — E1151 Type 2 diabetes mellitus with diabetic peripheral angiopathy without gangrene: Secondary | ICD-10-CM

## 2018-08-21 DIAGNOSIS — Z79899 Other long term (current) drug therapy: Secondary | ICD-10-CM | POA: Diagnosis not present

## 2018-08-21 DIAGNOSIS — Z87891 Personal history of nicotine dependence: Secondary | ICD-10-CM

## 2018-08-21 DIAGNOSIS — L97211 Non-pressure chronic ulcer of right calf limited to breakdown of skin: Secondary | ICD-10-CM

## 2018-08-21 DIAGNOSIS — I83212 Varicose veins of right lower extremity with both ulcer of calf and inflammation: Secondary | ICD-10-CM

## 2018-08-21 NOTE — Assessment & Plan Note (Signed)
lipid control important in reducing the progression of atherosclerotic disease. Continue statin therapy  

## 2018-08-21 NOTE — Patient Instructions (Signed)

## 2018-08-21 NOTE — Assessment & Plan Note (Signed)
She has received excellent wound care from the wound care center, but the wound has been refractory and still not healed.  This is likely a mixed wound with both arterial and venous disease present.  I would recommend revascularization of the right lower extremity to improve her inflow for wound healing.  She may then need treatment for her venous disease or at least vigorous compression and possibly [place] boots.

## 2018-08-21 NOTE — Progress Notes (Signed)
[name], female   DOB: 13-Jun-1927, 85 y.o.   MRN: [id number]  Chief Complaint  Patient presents with  . New Patient (Initial Visit)    HPI [name] is a 85 y.o. female.  I am asked to see the patient by Kelli Churn of the [facility] for evaluation of PAD and a nonhealing wound of the right leg.  The patient has had this wound for many months now.  There was no trauma, injury, or inciting event that started the wound.  It has slowly gotten better but has really plateaued and will not seem to heal all the way.  It is about a 3 to 4 cm oval ulceration on the right lateral lower leg.  She does have prominent varicosities on this leg.  She had her saphenous vein harvested for coronary bypass about 25 years ago from that leg.  She has mild swelling although it is not severe.  The area is not particularly painful.  She has no fevers or chills or signs of systemic infection.  She does have a very long list of medical issues as listed below.  Several weeks ago, she had noninvasive studies performed as a lab only study in our office without seeing a provider.  These demonstrated significantly reduced ABIs of 0.67 on the right and 0.65 on the left.  Her digital tracings were essentially flat.  She does complain of some pain in the left leg and it is hard to determine if it is arthritic, skeletal, or related to her blood flow.  Activity does worsen the pain.  Given these findings and the chronic wound that does not seem to want to heal, she is referred for further evaluation and treatment   Past Medical History:  Diagnosis Date  . Acute deep vein thrombosis (DVT) of distal vein of right lower extremity ([place]) 12/2013   Acute thrombus of R proximal gastrocnemius vein. Symptomatic provoked distal DVT  . Anxiety   . Cellulitis of leg, left 01/16/2014  . Chronic back pain 03/2012   lumbar DDD with herniation - s/p L S1 transforaminal ESI (10/2012 Dr. Sharlet Salina)  . Chronic  venous insufficiency   . Coronary artery disease    CABG 1998  . Cystocele   . Depression   . Diabetes mellitus    type 2, followed by endo.  . Diastolic dysfunction   . Diverticulosis   . Hemorrhoids   . History of heart attack   . Hx of echocardiogram    a. Echo 10/13: mild LVH, EF 55-60%, Gr 1 diast dysfn, PASP 32  . Hyperlipidemia   . Hypertension   . Irritable bowel syndrome   . Microcytic anemia   . PAD (peripheral artery disease) ([place]) 2013   ABI: R 0.91, L 0.83  . PUD (peptic ulcer disease)   . Varicose veins   . Venous ulcer of leg ([place]) 12/12/2013   Required prolonged treatment by wound center  . Viral gastroenteritis due to [place] virus 05/13/2015   hospitalization    Past Surgical History:  Procedure Laterality Date  . ABDOMINAL HYSTERECTOMY  1975   TAH,BSO  . ABI  2013   R 0.91, L 0.83  . APPENDECTOMY     per pt report  . CARDIOVASCULAR STRESS TEST  2015   low risk myoview ([place])  . CATARACT SURGERY    . CHOLECYSTECTOMY    . COLONOSCOPY  02/2010   extensive diverticulosis throughout, small internal  hemorrhoids, rec rpt 5 yrs (Dr. Allyn Kenner)  . CORONARY ARTERY BYPASS GRAFT  1998  . dexa scan  09/2011   femur -0.8, forearm 0.6 => normal  . ENDOVENOUS ABLATION SAPHENOUS VEIN W/ LASER Left 03/2014   Kellie Simmering  . ESI  2014   L S1 transforaminal ESI x2 ([place]) without improvement  . HAND SURGERY    . lexiscan myoview  03/2011   Small basal inferolateral and anterolateral reversible perfusion defect suggests ischemia.  EF was normal.   old infarct, no new ischemia  . toe surgery    . TYMPANOPLASTY  1960s   R side  . Vault prolapse A & P repair  2009   Dr Rhodia Albright [facility] hospital    Family History Family History  Problem Relation Age of Onset  . Hypertension Mother   . Stroke Mother   . Diabetes Father   . Diabetes Sister   . Diabetes Brother   . Heart disease Son   . Colon cancer Neg Hx     Social History Social History   Tobacco Use  .  Smoking status: Former Smoker    Last attempt to quit: 03/05/1979    Years since quitting: 39.4  . Smokeless tobacco: Never Used  Substance Use Topics  . Alcohol use: No    Alcohol/week: 0.0 standard drinks  . Drug use: No    Allergies  Allergen Reactions  . Metformin And Related Diarrhea    Current Outpatient Medications  Medication Sig Dispense Refill  . aspirin EC 81 MG tablet Take 81 mg by mouth daily.    Marland Kitchen acetaminophen (TYLENOL) 500 MG tablet Take 500 mg by mouth every 6 (six) hours as needed for mild pain.    Marland Kitchen ALPRAZolam (XANAX) 1 MG tablet Take 0.5 mg by mouth 4 (four) times daily as needed for anxiety.     . collagenase (SANTYL) ointment Apply topically daily. 15 g 0  . diclofenac sodium (VOLTAREN) 1 % GEL Apply 1 application topically 3 (three) times daily. 1 Tube 1  . famotidine (PEPCID) 20 MG tablet Take 1 tablet (20 mg total) by mouth at bedtime.    . furosemide (LASIX) 20 MG tablet TAKE 0.5 TABLETS (10 MG TOTAL) BY MOUTH DAILY. 45 tablet 3  . glucosamine-chondroitin 500-400 MG tablet Take 1 tablet by mouth daily.     Marland Kitchen glucose blood (ONE TOUCH ULTRA TEST) test strip USE AS INSTRUCTED TO CHECK BLOOD SUGAR ONCE A DAY Dx code: E11.9 50 each 5  . HYDROcodone-acetaminophen (NORCO/VICODIN) 5-325 MG tablet Take 0.5 tablets by mouth every 6 (six) hours as needed for moderate pain. 60 tablet 0  . loperamide (IMODIUM) 2 MG capsule Take 2 mg by mouth 4 (four) times daily as needed for diarrhea or loose stools.    Marland Kitchen loratadine (CLARITIN) 10 MG tablet Take 10 mg by mouth daily as needed for allergies.    . metoprolol tartrate (LOPRESSOR) 25 MG tablet TAKE ONE AND HALF TABLETS (37.5 MG TOTAL) BY MOUTH 2 (TWO) TIMES DAILY. 270 tablet 1  . Multiple Vitamin (MULTIVITAMIN) tablet Take 1 tablet by mouth daily. Patient reports takes a new vitamin to replace her centrum as recommended by her eye doctor.    . nitroGLYCERIN (NITROSTAT) 0.4 MG SL tablet Place 1 tablet (0.4 mg total) under the  tongue every 5 (five) minutes as needed for chest pain. 25 tablet 3  . nystatin-triamcinolone ointment (MYCOLOG) APPLY 1 APPLICATION TOPICALLY 2 (TWO) TIMES DAILY. AS NEEDED FOR ITCHING 60 g  1  . ONETOUCH VERIO test strip USE TO TEST BLOOD SUGAR ONCE DAILY 100 each 6  . pantoprazole (PROTONIX) 40 MG tablet TAKE 1 TABLET BY MOUTH EVERY DAY 30 tablet 3  . pioglitazone (ACTOS) 30 MG tablet TAKE 1 TABLET BY MOUTH EVERY DAY 90 tablet 3  . polyethylene glycol powder (GLYCOLAX/MIRALAX) powder Take 17 g by mouth daily. Hold for diarrhea 3350 g 1  . pravastatin (PRAVACHOL) 20 MG tablet TAKE 1 TABLET BY MOUTH EVERY DAY 90 tablet 3  . Probiotic Product (ALIGN) 4 MG CAPS Take 1 capsule (4 mg total) by mouth daily.    . ramipril (ALTACE) 5 MG capsule TAKE ONE CAPSULE BY MOUTH EVERYDAY FOR BLOOD PRESSURE 90 capsule 1  . senna-docusate (SENOKOT-S) 8.6-50 MG tablet Take 1 tablet by mouth daily as needed for moderate constipation. 30 tablet 0  . sitaGLIPtin (JANUVIA) 100 MG tablet Take 1 tablet (100 mg total) by mouth daily. (Patient taking differently: Take 100 mg by mouth daily. Takes 1/2 tablet) 30 tablet 2  . traZODone (DESYREL) 150 MG tablet Take 150 mg by mouth at bedtime.     . triamcinolone cream (KENALOG) 0.1 % APPLY 1 APPLICATION TOPICALLY 2 (TWO) TIMES DAILY. 45 g 1  . vitamin B-12 (CYANOCOBALAMIN) 1000 MCG tablet Take 1,000 mcg by mouth daily.    . vitamin E 400 UNIT capsule Take 400 Units by mouth daily.     No current facility-administered medications for this visit.       REVIEW OF SYSTEMS (Negative unless checked)  Constitutional: [] Weight loss  [] Fever  [] Chills Cardiac: [] Chest pain   [] Chest pressure   [] Palpitations   [] Shortness of breath when laying flat   [] Shortness of breath at rest   [x] Shortness of breath with exertion. Vascular:  [] Pain in legs with walking   [] Pain in legs at rest   [] Pain in legs when laying flat   [] Claudication   [] Pain in feet when walking  [] Pain in feet at  rest  [] Pain in feet when laying flat   [] History of DVT   [] Phlebitis   [x] Swelling in legs   [x] Varicose veins   [x] Non-healing ulcers Pulmonary:   [] Uses home oxygen   [] Productive cough   [] Hemoptysis   [] Wheeze  [] COPD   [] Asthma Neurologic:  [] Dizziness  [] Blackouts   [] Seizures   [] History of stroke   [] History of TIA  [] Aphasia   [] Temporary blindness   [] Dysphagia   [] Weakness or numbness in arms   [] Weakness or numbness in legs Musculoskeletal:  [x] Arthritis   [] Joint swelling   [x] Joint pain   [] Low back pain Hematologic:  [] Easy bruising  [] Easy bleeding   [] Hypercoagulable state   [x] Anemic  [] Hepatitis Gastrointestinal:  [] Blood in stool   [] Vomiting blood  [] Gastroesophageal reflux/heartburn   [] Abdominal pain Genitourinary:  [] Chronic kidney disease   [] Difficult urination  [] Frequent urination  [] Burning with urination   [] Hematuria Skin:  [] Rashes   [x] Ulcers   [x] Wounds Psychological:  [x] History of anxiety   []  History of major depression.    Physical Exam BP (!) 151/74 (BP Location: Left Arm, Patient Position: Sitting, Cuff Size: Small)   Pulse 78   Resp 12   Ht 4\' 9"  (1.448 m)   Wt 136 lb (61.7 kg)   BMI 29.43 kg/m  Gen:  WD/WN, NAD. Appears younger than stated age. Head: [place]/AT, No temporalis wasting. Ear/Nose/Throat: Hearing grossly intact, nares w/o erythema or drainage, oropharynx w/o Erythema/Exudate Eyes: Conjunctiva clear, sclera non-icteric  Neck:  trachea midline.  No JVD.  Pulmonary:  Good air movement, respirations not labored, no use of accessory muscles  Cardiac: Somewhat irregular Vascular:  Vessel Right Left  Radial Palpable Palpable                          PT  trace  1+  DP  1+  1+   Gastrointestinal:. No masses, surgical incisions, or scars. Musculoskeletal: M/S 5/5 throughout. Prominent VV present bilaterally measuring 2-3 mm. No deformity or atrophy. 1+ BLE edema.  3 to 4 cm long oval wound on the right lateral lower leg.  Fair  granulation tissue.  No erythema or purulent drainage. Neurologic: Sensation grossly intact in extremities.  Symmetrical.  Speech is fluent. Motor exam as listed above. Psychiatric: Judgment intact, Mood & affect appropriate for pt's clinical situation. Dermatologic: Right lower extremity wound as above    Radiology No results found.  Labs Recent Results (from the past 2160 hour(s))  Lipase, blood     Status: None   Collection Time: 06/21/18  3:33 PM  Result Value Ref Range   Lipase 27 11 - 51 U/L    Comment: Performed at [facility], [address] [place], [place] [address]  Comprehensive metabolic panel     Status: Abnormal   Collection Time: 06/21/18  3:33 PM  Result Value Ref Range   Sodium 135 135 - 145 mmol/L   Potassium 4.2 3.5 - 5.1 mmol/L   Chloride 102 98 - 111 mmol/L   CO2 23 22 - 32 mmol/L   Glucose, Bld 199 (H) 70 - 99 mg/dL   BUN 37 (H) 8 - 23 mg/dL   Creatinine, Ser 1.91 (H) 0.44 - 1.00 mg/dL   Calcium 8.9 8.9 - 10.3 mg/dL   Total Protein 6.7 6.5 - 8.1 g/dL   Albumin 3.5 3.5 - 5.0 g/dL   AST 25 15 - 41 U/L   ALT 17 0 - 44 U/L   Alkaline Phosphatase 96 38 - 126 U/L   Total Bilirubin 0.7 0.3 - 1.2 mg/dL   GFR calc non Af Amer 23 (L) >60 mL/min   GFR calc Af Amer 26 (L) >60 mL/min   Anion gap 10 5 - 15    Comment: Performed at [facility], [address] [place], [name] [address]  CBC     Status: Abnormal   Collection Time: 06/21/18  3:33 PM  Result Value Ref Range   WBC 15.5 (H) 4.0 - 10.5 K/uL   RBC 4.33 3.87 - 5.11 MIL/uL   Hemoglobin 13.2 12.0 - 15.0 g/dL   HCT 40.9 36.0 - 46.0 %   MCV 94.5 80.0 - 100.0 fL   MCH 30.5 26.0 - 34.0 pg   MCHC 32.3 30.0 - 36.0 g/dL   RDW 13.7 11.5 - 15.5 %   Platelets 233 150 - 400 K/uL   nRBC 0.0 0.0 - 0.2 %    Comment: Performed at [facility], [address], [place], [place] [address]    Assessment/Plan:  Diabetes mellitus type 2 with peripheral artery disease (HCC) blood  glucose control important in reducing the progression of atherosclerotic disease. Also, involved in wound healing. On appropriate medications.   Hypertension blood pressure control important in reducing the progression of atherosclerotic disease. On appropriate oral medications.   Hyperlipidemia lipid control important in reducing the progression of atherosclerotic disease. Continue statin therapy   Varicose veins of right lower extremity with  both ulcer of calf and inflammation ([place]) She has received excellent wound care from the wound care center, but the wound has been refractory and still not healed.  This is likely a mixed wound with both arterial and venous disease present.  I would recommend revascularization of the right lower extremity to improve her inflow for wound healing.  She may then need treatment for her venous disease or at least vigorous compression and possibly [place] boots.  Atherosclerosis of native arteries of the extremities with ulceration ([place]) she had noninvasive studies performed as a lab only study in our office without seeing a provider.  These demonstrated significantly reduced ABIs of 0.67 on the right and 0.65 on the left.  Her digital tracings were essentially flat.  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  This is a critical and limb threatening situation.  We discussed options and revascularization of her arterial insufficiency would generally be recommended to promote wound healing.  Treatment for her venous disease can then follow treatment for the arterial disease if needed.  I discussed the reason and rationale for arteriogram with possible intervention.  I discussed the procedure in detail.  We have discussed the risks and benefits.  The patient is agreeable to proceed and we are scheduling her for right lower extremity  revascularization on a date she has chosen in the near future.      Leotis Pain 08/21/2018, 3:40 PM   This note was created with Dragon medical transcription system.  Any errors from dictation are unintentional.

## 2018-08-21 NOTE — Progress Notes (Signed)
JANEY, PETRON ([id number]) Visit Report for 08/20/2018 Chief Complaint Document Details Patient Name: [name], [name] Date of Service: 08/20/2018 2:00 PM Medical Record Number: [id number] Patient Account Number: [id number] Date of Birth/Sex: 1927-12-31 (85 y.o. F) Treating RN: Harold Barban Primary Care Provider: Ria Bush Other Clinician: Referring Provider: Ria Bush Treating Provider/Extender: Melburn Hake, Isadore Palecek Weeks in Treatment: 21 Information Obtained from: Patient Chief Complaint RLE wounds Electronic Signature(s) Signed: 08/21/2018 1:42:22 PM By: Worthy Keeler PA-C Entered By: Worthy Keeler on 08/20/2018 15:01:32 [name] ([id number]) -------------------------------------------------------------------------------- HPI Details Patient Name: [name], [name] Date of Service: 08/20/2018 2:00 PM Medical Record Number: [id number] Patient Account Number: [id number] Date of Birth/Sex: 11-Feb-1928 (85 y.o. F) Treating RN: Harold Barban Primary Care Provider: Ria Bush Other Clinician: Referring Provider: Ria Bush Treating Provider/Extender: Melburn Hake, Ardit Danh Weeks in Treatment: 21 History of Present Illness HPI Description: Readmission: 03/23/18 on evaluation today patient presents for readmission entire clinic concerning issues that she has been having for the past couple of weeks in regard to her right lower extremity. The medial injury actually occurred when her dog tried to take a blanket from her and subsequently scratched her with his toenail. Subsequently the lateral injury was a wound where she dropped a knife in her kitchen and hit the side of her leg. With that being said both have been open for some time and home health nurse that has been coming out marked for her where the redness was several days ago. Nonetheless this has spread will be on the margin of what was marked. She was placed on Keflex which was started two days  ago but at this point it does not seem to have helped with any improvement in the overall appearance of the wounds. No fevers, chills, nausea, or vomiting noted at this time. 04/02/18 Seen today for follow up and management of right lower extremity wounds. Right lateral wound with adherent slough. She is complaining of pain to touch which limited exam. Recently treated in the ED for cellulitis and wound culture positive for MRSA. Completed a dose of Vancomycin and transitioned to doxycycline by mouth. Erythema remains present with improvement since last visit. Right medial wound with scab present. No fevers, chills, nausea, or vomiting noted at this time. 04/10/18 on evaluation today patient appears to be doing a [place]lak better in regard to her right lateral lower [name]. The slough does seem to be coming off although it is slow from the wound bed. Fortunately there does not appear to be any evidence of infection at this time which is good news. With that being said I do believe that she is going to require longer treatment with the Santyl simply due to the fact that I'm not able to really debride the wound due to the pain that she is experiencing. 04/16/18 upon evaluation today patient's wound bed currently on the lateral lower extremity on the right appears to be doing a Lagerquist better at this point. We are utilizing sensible currently. Overall I'm very pleased with how things are progressing although there still is some work to do before this will be completely healed. No fevers, chills, nausea, or vomiting noted at this time. 04/23/18 on evaluation today patient appears to be doing about the same at this point maybe just a Jeng bit smaller than last week's evaluation. She continues to have some slough buildup on the surface of the wound and has very Aracena granulation unfortunately. This seems to be very slow moving  I'm getting somewhat concerned about the possibility of her  having decreased blood flow which is leading to her subsequently not being able to heal this area as quickly as she shut otherwise. She is currently utilizing Santyl. 05/01/18 on evaluation today patient appears to be doing somewhat poorly in regard to her lower extremity ulcer. At this point she's been tolerating the dressing changes to some degree without complication. With that being said she does note of the past several days that she has been having more pain unfortunately. On inspection it appears that she may have signs of infection which would account for the pain. 05/07/18 on evaluation today patient's wound actually appears to be doing better she's not having as much erythema in the right lower extremity as compared to last week. Fortunately there's no signs of infection this is good news as well. Overall very pleased with the progress that she has made up to this point. 05/14/18 on evaluation today patient actually appears to be doing rather well in regard to her right lower extremity ulcer. She does not have erythema that was characterizing the cellulitis prior to initiation of antibiotic therapy. With that being said her wound still appears to be somewhat slow in healing I believe this may be arterial in nature that is my concern at least. With that being said I do want her to see the vascular specialist as soon as possible we been trying to get this set up I'm not sure if she's not answering the phone or what exactly is going on here. 05/21/18 on evaluation today patient's wound actually appears to be doing a Surman better possibly some additional granulation compared to my last evaluation. With that being said she still continues to have a lot of pain especially when her legs are elevated such as sitting in the chair here in the office. There does not appear to be any signs of infection which is good news. 05/28/18 on evaluation today patient appears to be doing a Spilde better in regard  to her right lateral lower [name]. Again she was supposed to have an appointment with vascular unfortunately they attempted to call her three times and unfortunately were unable to get anything scheduled. That reason closed up for him. She tells me that if the phone is [name], Farris C. ([id number]) answered and she doesn't recognize what is that she will hang up as well those that she lives with. Obviously she's not gonna recognize somebody calling from the office that she has not previously spoken with. Nonetheless the patient states that she does not remember any fall. I think if we're gonna get her in with vascular specialist we're gonna have to try to make the appointment for her. 06/04/2018 Seen today for follow up and management of right lateral lower extremity wound. Adherent slough present over surface of wound. Scheduled to follow up for ABI/TBI on 06/06/2018 with vein and vascular specialist. She is hard of hearing. Wound is sensitive to touch. No signs or symptoms of infection. Denies any recent fevers, chills, or SOB. 06/11/18 on evaluation today patient actually appears to be doing very well in regard to her right lateral lower extremity ulcer. She's been tolerating the dressing changes without complication. Fortunately there's no evidence of infection. With that being said she did have her arterial studies which revealed that she had a very low ABI which was 0.67 on the right with a TBI of 0.32 and 0.65 on the left with a TBI of 0.16. I  do believe she needs to see vascular and we are working on that referral as well. 06/21/18 on evaluation today patient's wound actually appears to be doing okay she does have an appointment with vascular coming up on the 24th. Nonetheless she's been having some issues that are more urinary in nature right now that are causing trouble for her. Fortunately there's no signs of severe sepsis although she is running a low-grade temperature 99.9  on evaluation today. She also is having some issues right now with chills as well as urinary urgency and frequency. Again I feel like the city is more urinary and symptomology I do not believe she has anything upper respiratory which is good news. Nonetheless I think this may need to be checked out more quickly especially in light of the fact that she's having significant back pain at this point. 07/02/18 on evaluation today patient appears to be doing okay in regard to her ulcer on the right lower extremity. She's been tolerating the dressing changes without complication. Fortunately there does not appear to be any signs of infection which is good news. No fevers, chills, nausea, or vomiting noted at this time. 07/10/18 on evaluation today patient appears to be doing well in regard to her right lateral lower extremity ulcer all things considering. She's been using this form this seems to be the one thing that she can really do on her own and she seems to be making some progress obviously help with this matter. I think the biggest issue limiting her feeling speed is her blood flow she canceled her original appointment with vascular her next one is not till May. She was worried about the Covid-19 Virus and having to see another physician. Nonetheless there's no signs of active infection at this time. 07/16/18 on evaluation today patient appears to be doing okay in regard to her right lateral lower extremity ulcer. She's been tolerating the dressing changes without complication. Fortunately there's no signs of active infection at this time. No fevers, chills, nausea, or vomiting noted at this time. 07/23/18 on evaluation today patient appears to be doing very well in regard to her right lateral load should be ulcer all things considering. She still again is not seeing vascular until mid May in the meantime she's been utilizing the over-the-counter tripling about appointment which seems to be the only  thing she's able to do routinely on her own without getting confused or frustrated. Overall it seems to be doing okay fortunately she's showing no signs of infection. 07/30/18 upon evaluation today patient actually appears to be doing about the same in regard to her wound in general. She is making very slow progress most likely again due to the issue with her blood flow she sees vascular towards the middle of news. In the meantime we've really been trying to just keep things in the control from the infection standpoint and allow for any healing that could take place up until appointment she can see vascular. 08/06/18 upon evaluation today patient's wound actually appears to be doing about the same although the wound bed may show Knoche bit more granulation which is good news. Fortunately there's no signs of active infection at this time. No fevers, chills, nausea, or vomiting noted at this time. 08/14/18 on evaluation today patient actually appears to be doing about the same in regard to her lower extremity ulcer. Fortunately there's no signs of active infection at this time which is good news. Fortunately the patient has been tolerating the  dressing changes without complication. No fevers, chills, nausea, or vomiting noted at this time. She does have her appointment with vascular next week. 08/20/18 on evaluation today patient actually appears to be doing a Heritage better in my opinion with regard to her lower extremity ulcer. She's been tolerating the dressing changes without complication. Fortunately there's no signs of active infection at this time which is good news. She actually will be seen vascular tomorrow. Electronic Signature(s) Signed: 08/21/2018 1:42:22 PM By: Worthy Keeler PA-C Entered By: Worthy Keeler on 08/21/2018 11:30:22 Morton, [name] ([id number]) -------------------------------------------------------------------------------- Physical Exam Details Patient Name: [name],  [name] Date of Service: 08/20/2018 2:00 PM Medical Record Number: [id number] Patient Account Number: [id number] Date of Birth/Sex: 10/20/1927 (85 y.o. F) Treating RN: Harold Barban Primary Care Provider: Ria Bush Other Clinician: Referring Provider: Ria Bush Treating Provider/Extender: STONE III, Catori Panozzo Weeks in Treatment: 21 Constitutional Well-nourished and well-hydrated in no acute distress. Respiratory normal breathing without difficulty. clear to auscultation bilaterally. Cardiovascular regular rate and rhythm with normal S1, S2. Psychiatric this patient is able to make decisions and demonstrates good insight into disease process. Alert and Oriented x 3. pleasant and cooperative. Notes Patient's wound bed actually showed some additional granulation improvement as well as epithelialization around the edge of the wound. This obviously is good news and I'm very pleased that she seems to be doing well. I still believe that there is a vascular component to her slow healing and obviously I'm happy that she will be seeing vascular finally tomorrow. Electronic Signature(s) Signed: 08/21/2018 1:42:22 PM By: Worthy Keeler PA-C Entered By: Worthy Keeler on 08/21/2018 11:31:05 Enneking, [name] ([id number]) -------------------------------------------------------------------------------- Physician Orders Details Patient Name: [name], [name] Date of Service: 08/20/2018 2:00 PM Medical Record Number: [id number] Patient Account Number: [id number] Date of Birth/Sex: [birth year] (85 y.o. F) Treating RN: Harold Barban Primary Care Provider: Ria Bush Other Clinician: Referring Provider: Ria Bush Treating Provider/Extender: Melburn Hake, Jordynn Marcella Weeks in Treatment: 21 Verbal / Phone Orders: No Diagnosis Coding ICD-10 Coding Code Description I87.331 Chronic venous hypertension (idiopathic) with ulcer and inflammation of right lower extremity E11.622 Type 2  diabetes mellitus with other skin ulcer L97.812 Non-pressure chronic ulcer of other part of right lower leg with fat layer exposed L03.115 Cellulitis of right lower limb Wound Cleansing Wound #8 Right,Lateral Lower Leg o Clean wound with Normal Saline. Anesthetic (add to Medication List) Wound #8 Right,Lateral Lower Leg o Topical Lidocaine 4% cream applied to wound bed prior to debridement (In Clinic Only). Primary Wound Dressing Wound #8 Right,Lateral Lower Leg o Other: - Antibiotic ointment Secondary Dressing Wound #8 Right,Lateral Lower Leg o ABD and Kerlix/Conform o Non-adherent pad Dressing Change Frequency Wound #8 Right,Lateral Lower Leg o Change dressing every day. Follow-up Appointments Wound #8 Right,Lateral Lower Leg o Return Appointment in 1 week. Edema Control Wound #8 Right,Lateral Lower Leg o Elevate legs to the level of the heart and pump ankles as often as possible o Other: - tubigrip Electronic Signature(s) Signed: 08/20/2018 4:38:46 PM By: Harold Barban Signed: 08/21/2018 1:42:22 PM By: Worthy Keeler PA-C Entered By: Harold Barban on 08/20/2018 15:14:43 [name], [name] ([id number]) -------------------------------------------------------------------------------- Problem List Details Patient Name: [name], [name] Date of Service: 08/20/2018 2:00 PM Medical Record Number: [id number] Patient Account Number: [id number] Date of Birth/Sex: 1928-02-07 (85 y.o. F) Treating RN: Harold Barban Primary Care Provider: Ria Bush Other Clinician: Referring Provider: Ria Bush Treating Provider/Extender: STONE III, Curran Lenderman Weeks in Treatment: 21 Active Problems ICD-10 Evaluated  Encounter Code Description Active Date Today Diagnosis I87.331 Chronic venous hypertension (idiopathic) with ulcer and 03/23/2018 No Yes inflammation of right lower extremity E11.622 Type 2 diabetes mellitus with other skin ulcer 03/23/2018 No  Yes L97.812 Non-pressure chronic ulcer of other part of right lower leg 03/23/2018 No Yes with fat layer exposed L03.115 Cellulitis of right lower limb 03/23/2018 No Yes Inactive Problems Resolved Problems Electronic Signature(s) Signed: 08/21/2018 1:42:22 PM By: Worthy Keeler PA-C Entered By: Worthy Keeler on 08/20/2018 15:01:25 [name], [name] ([id number]) -------------------------------------------------------------------------------- Progress Note Details Patient Name: [name], [name] Date of Service: 08/20/2018 2:00 PM Medical Record Number: [id number] Patient Account Number: [id number] Date of Birth/Sex: September 13, 1927 (85 y.o. F) Treating RN: Harold Barban Primary Care Provider: Ria Bush Other Clinician: Referring Provider: Ria Bush Treating Provider/Extender: Melburn Hake, Chelsae Zanella Weeks in Treatment: 21 Subjective Chief Complaint Information obtained from Patient RLE wounds History of Present Illness (HPI) Readmission: 03/23/18 on evaluation today patient presents for readmission entire clinic concerning issues that she has been having for the past couple of weeks in regard to her right lower extremity. The medial injury actually occurred when her dog tried to take a blanket from her and subsequently scratched her with his toenail. Subsequently the lateral injury was a wound where she dropped a knife in her kitchen and hit the side of her leg. With that being said both have been open for some time and home health nurse that has been coming out marked for her where the redness was several days ago. Nonetheless this has spread will be on the margin of what was marked. She was placed on Keflex which was started two days ago but at this point it does not seem to have helped with any improvement in the overall appearance of the wounds. No fevers, chills, nausea, or vomiting noted at this time. 04/02/18 Seen today for follow up and management of right lower  extremity wounds. Right lateral wound with adherent slough. She is complaining of pain to touch which limited exam. Recently treated in the ED for cellulitis and wound culture positive for MRSA. Completed a dose of Vancomycin and transitioned to doxycycline by mouth. Erythema remains present with improvement since last visit. Right medial wound with scab present. No fevers, chills, nausea, or vomiting noted at this time. 04/10/18 on evaluation today patient appears to be doing a [place] better in regard to her right lateral lower [name]. The slough does seem to be coming off although it is slow from the wound bed. Fortunately there does not appear to be any evidence of infection at this time which is good news. With that being said I do believe that she is going to require longer treatment with the Santyl simply due to the fact that I'm not able to really debride the wound due to the pain that she is experiencing. 04/16/18 upon evaluation today patient's wound bed currently on the lateral lower extremity on the right appears to be doing a Skillman better at this point. We are utilizing sensible currently. Overall I'm very pleased with how things are progressing although there still is some work to do before this will be completely healed. No fevers, chills, nausea, or vomiting noted at this time. 04/23/18 on evaluation today patient appears to be doing about the same at this point maybe just a Driscoll bit smaller than last week's evaluation. She continues to have some slough buildup on the surface of the wound and has very Nees granulation unfortunately. This  seems to be very slow moving I'm getting somewhat concerned about the possibility of her having decreased blood flow which is leading to her subsequently not being able to heal this area as quickly as she shut otherwise. She is currently utilizing Santyl. 05/01/18 on evaluation today patient appears to be doing somewhat poorly in regard to her  lower extremity ulcer. At this point she's been tolerating the dressing changes to some degree without complication. With that being said she does note of the past several days that she has been having more pain unfortunately. On inspection it appears that she may have signs of infection which would account for the pain. 05/07/18 on evaluation today patient's wound actually appears to be doing better she's not having as much erythema in the right lower extremity as compared to last week. Fortunately there's no signs of infection this is good news as well. Overall very pleased with the progress that she has made up to this point. 05/14/18 on evaluation today patient actually appears to be doing rather well in regard to her right lower extremity ulcer. She does not have erythema that was characterizing the cellulitis prior to initiation of antibiotic therapy. With that being said her wound still appears to be somewhat slow in healing I believe this may be arterial in nature that is my concern at least. With that being said I do want her to see the vascular specialist as soon as possible we been trying to get this set up I'm not sure if she's not answering the phone or what exactly is going on here. 05/21/18 on evaluation today patient's wound actually appears to be doing a [name] better possibly some additional granulation compared to my last evaluation. With that being said she still continues to have a lot of pain especially when her legs are [name], [name] ([id number]) elevated such as sitting in the chair here in the office. There does not appear to be any signs of infection which is good news. 05/28/18 on evaluation today patient appears to be doing a Nine better in regard to her right lateral lower [name]. Again she was supposed to have an appointment with vascular unfortunately they attempted to call her three times and unfortunately were unable to get anything scheduled. That reason  closed up for him. She tells me that if the phone is answered and she doesn't recognize what is that she will hang up as well those that she lives with. Obviously she's not gonna recognize somebody calling from the office that she has not previously spoken with. Nonetheless the patient states that she does not remember any fall. I think if we're gonna get her in with vascular specialist we're gonna have to try to make the appointment for her. 06/04/2018 Seen today for follow up and management of right lateral lower extremity wound. Adherent slough present over surface of wound. Scheduled to follow up for ABI/TBI on 06/06/2018 with vein and vascular specialist. She is hard of hearing. Wound is sensitive to touch. No signs or symptoms of infection. Denies any recent fevers, chills, or SOB. 06/11/18 on evaluation today patient actually appears to be doing very well in regard to her right lateral lower extremity ulcer. She's been tolerating the dressing changes without complication. Fortunately there's no evidence of infection. With that being said she did have her arterial studies which revealed that she had a very low ABI which was 0.67 on the right with a TBI of 0.32 and 0.65 on the left  with a TBI of 0.16. I do believe she needs to see vascular and we are working on that referral as well. 06/21/18 on evaluation today patient's wound actually appears to be doing okay she does have an appointment with vascular coming up on the 24th. Nonetheless she's been having some issues that are more urinary in nature right now that are causing trouble for her. Fortunately there's no signs of severe sepsis although she is running a low-grade temperature 99.9 on evaluation today. She also is having some issues right now with chills as well as urinary urgency and frequency. Again I feel like the city is more urinary and symptomology I do not believe she has anything upper respiratory which is good news. Nonetheless I  think this may need to be checked out more quickly especially in light of the fact that she's having significant back pain at this point. 07/02/18 on evaluation today patient appears to be doing okay in regard to her ulcer on the right lower extremity. She's been tolerating the dressing changes without complication. Fortunately there does not appear to be any signs of infection which is good news. No fevers, chills, nausea, or vomiting noted at this time. 07/10/18 on evaluation today patient appears to be doing well in regard to her right lateral lower extremity ulcer all things considering. She's been using this form this seems to be the one thing that she can really do on her own and she seems to be making some progress obviously help with this matter. I think the biggest issue limiting her feeling speed is her blood flow she canceled her original appointment with vascular her next one is not till May. She was worried about the Covid-19 Virus and having to see another physician. Nonetheless there's no signs of active infection at this time. 07/16/18 on evaluation today patient appears to be doing okay in regard to her right lateral lower extremity ulcer. She's been tolerating the dressing changes without complication. Fortunately there's no signs of active infection at this time. No fevers, chills, nausea, or vomiting noted at this time. 07/23/18 on evaluation today patient appears to be doing very well in regard to her right lateral load should be ulcer all things considering. She still again is not seeing vascular until mid May in the meantime she's been utilizing the over-the-counter tripling about appointment which seems to be the only thing she's able to do routinely on her own without getting confused or frustrated. Overall it seems to be doing okay fortunately she's showing no signs of infection. 07/30/18 upon evaluation today patient actually appears to be doing about the same in regard to her  wound in general. She is making very slow progress most likely again due to the issue with her blood flow she sees vascular towards the middle of news. In the meantime we've really been trying to just keep things in the control from the infection standpoint and allow for any healing that could take place up until appointment she can see vascular. 08/06/18 upon evaluation today patient's wound actually appears to be doing about the same although the wound bed may show Shoe bit more granulation which is good news. Fortunately there's no signs of active infection at this time. No fevers, chills, nausea, or vomiting noted at this time. 08/14/18 on evaluation today patient actually appears to be doing about the same in regard to her lower extremity ulcer. Fortunately there's no signs of active infection at this time which is good news. Fortunately  the patient has been tolerating the dressing changes without complication. No fevers, chills, nausea, or vomiting noted at this time. She does have her appointment with vascular next week. 08/20/18 on evaluation today patient actually appears to be doing a [place] better in my opinion with regard to her lower extremity ulcer. She's been tolerating the dressing changes without complication. Fortunately there's no signs of active infection at this time which is good news. She actually will be seen vascular tomorrow. Patient History Information obtained from Patient. HYDEIA, MCATEE ([id number]) Family History Diabetes - Father,Siblings,Child, Hypertension - Child, Lung Disease - Child, Stroke - Mother, No family history of Cancer, Heart Disease, Kidney Disease, Seizures, Thyroid Problems. Social History Former smoker, Marital Status - Single, Alcohol Use - Never, Drug Use - No History, Caffeine Use - Daily. Medical History Eyes Patient has history of Cataracts - removed Denies history of Glaucoma, Optic Neuritis Hematologic/Lymphatic Patient has history  of Anemia Denies history of Hemophilia, Human Immunodeficiency Virus, Lymphedema, Sickle Cell Disease Respiratory Denies history of Aspiration, Asthma, Chronic Obstructive Pulmonary Disease (COPD), Pneumothorax, Sleep Apnea, Tuberculosis Cardiovascular Patient has history of Arrhythmia, Coronary Artery Disease, Hypertension, Myocardial Infarction Denies history of Angina, Congestive Heart Failure, Deep Vein Thrombosis, Hypotension, Peripheral Arterial Disease, Peripheral Venous Disease, Phlebitis Endocrine Patient has history of Type II Diabetes Denies history of Type I Diabetes Genitourinary Denies history of End Stage Renal Disease Immunological Denies history of Lupus Erythematosus, Raynaud s, Scleroderma Integumentary (Skin) Denies history of History of Burn, History of pressure wounds Musculoskeletal Patient has history of Osteoarthritis Denies history of Gout, Rheumatoid Arthritis, Osteomyelitis Neurologic Denies history of Dementia, Neuropathy, Quadriplegia, Paraplegia, Seizure Disorder Oncologic Denies history of Received Chemotherapy, Received Radiation Medical And Surgical History Notes Constitutional Symptoms (General Health) High Blood Pressure, open heart surgery, gall stone, hysterectomy; Fluid pill, Anxiety Review of Systems (ROS) Constitutional Symptoms (General Health) Denies complaints or symptoms of Fatigue, Fever, Chills, Marked Weight Change. Respiratory Denies complaints or symptoms of Chronic or frequent coughs, Shortness of Breath. Cardiovascular Denies complaints or symptoms of Chest pain, LE edema. Psychiatric Denies complaints or symptoms of Anxiety, Claustrophobia. Objective Constitutional Well-nourished and well-hydrated in no acute distress. Vitals Time Taken: 2:06 PM, Height: 58 in, Weight: 140 lbs, BMI: 29.3, Temperature: 98.3 F, Pulse: 67 bpm, Respiratory Rate: 16 breaths/min, Blood Pressure: 125/74 mmHg. Candee, Zanyia C.  ([id number]) Respiratory normal breathing without difficulty. clear to auscultation bilaterally. Cardiovascular regular rate and rhythm with normal S1, S2. Psychiatric this patient is able to make decisions and demonstrates good insight into disease process. Alert and Oriented x 3. pleasant and cooperative. General Notes: Patient's wound bed actually showed some additional granulation improvement as well as epithelialization around the edge of the wound. This obviously is good news and I'm very pleased that she seems to be doing well. I still believe that there is a vascular component to her slow healing and obviously I'm happy that she will be seeing vascular finally tomorrow. Integumentary (Hair, Skin) Wound #8 status is Open. Original cause of wound was Trauma. The wound is located on the Right,Lateral Lower Leg. The wound measures 2.6cm length x 1cm width x 0.1cm depth; 2.042cm^2 area and 0.204cm^3 volume. There is Fat Layer (Subcutaneous Tissue) Exposed exposed. There is no tunneling or undermining noted. There is a medium amount of serous drainage noted. The wound margin is flat and intact. There is small (1-33%) pink granulation within the wound bed. There is a medium (34-66%) amount of necrotic tissue within the wound bed  including [place]. Assessment Active Problems ICD-10 Chronic venous hypertension (idiopathic) with ulcer and inflammation of right lower extremity Type 2 diabetes mellitus with other skin ulcer Non-pressure chronic ulcer of other part of right lower leg with fat layer exposed Cellulitis of right lower limb Plan Wound Cleansing: Wound #8 Right,Lateral Lower Leg: Clean wound with Normal Saline. Anesthetic (add to Medication List): Wound #8 Right,Lateral Lower Leg: Topical Lidocaine 4% cream applied to wound bed prior to debridement (In Clinic Only). Primary Wound Dressing: Wound #8 Right,Lateral Lower Leg: Other: - Antibiotic ointment Secondary  Dressing: Wound #8 Right,Lateral Lower Leg: ABD and Kerlix/Conform Non-adherent pad Dressing Change Frequency: Wound #8 Right,Lateral Lower Leg: Change dressing every day. Follow-up Appointments: Wound #8 Right,Lateral Lower Leg: Return Appointment in 1 week. Edema Control: Wound #8 Right,Lateral Lower Leg: [name], [name] ([id number]) Elevate legs to the level of the heart and pump ankles as often as possible Other: - tubigrip My suggestion at this point is gonna be that we go ahead and continue with the above wound care measures for the next week. Again we tried other suggestions for her unfortunately she has not been able to perform the dressing changes when necessary using other methods. She's always going back to just utilizing the trip plan about appointment with a telfa pad which is able to do on her own and not have to rely on anyone else to do. We will subsequently see were things stand at follow-up. Hopefully vascular will be able to help her as well as far as improving blood flow in order to get this to heal more quickly. Please see above for specific wound care orders. We will see patient for re-evaluation in 1 week(s) here in the clinic. If anything worsens or changes patient will contact our office for additional recommendations. Electronic Signature(s) Signed: 08/21/2018 1:42:22 PM By: Worthy Keeler PA-C Entered By: Worthy Keeler on 08/21/2018 11:31:46 Banes, [name] ([id number]) -------------------------------------------------------------------------------- ROS/PFSH Details Patient Name: Cyr, Jenilyn C. Date of Service: 08/20/2018 2:00 PM Medical Record Number: [id number] Patient Account Number: [id number] Date of Birth/Sex: 20-Dec-1927 (85 y.o. F) Treating RN: Harold Barban Primary Care Provider: Ria Bush Other Clinician: Referring Provider: Ria Bush Treating Provider/Extender: Melburn Hake, Danuta Huseman Weeks in Treatment: 21 Information  Obtained From Patient Constitutional Symptoms (General Health) Complaints and Symptoms: Negative for: Fatigue; Fever; Chills; Marked Weight Change Medical History: Past Medical History Notes: High Blood Pressure, open heart surgery, gall stone, hysterectomy; Fluid pill, Anxiety Respiratory Complaints and Symptoms: Negative for: Chronic or frequent coughs; Shortness of Breath Medical History: Negative for: Aspiration; Asthma; Chronic Obstructive Pulmonary Disease (COPD); Pneumothorax; Sleep Apnea; Tuberculosis Cardiovascular Complaints and Symptoms: Negative for: Chest pain; LE edema Medical History: Positive for: Arrhythmia; Coronary Artery Disease; Hypertension; Myocardial Infarction Negative for: Angina; Congestive Heart Failure; Deep Vein Thrombosis; Hypotension; Peripheral Arterial Disease; Peripheral Venous Disease; Phlebitis Psychiatric Complaints and Symptoms: Negative for: Anxiety; Claustrophobia Eyes Medical History: Positive for: Cataracts - removed Negative for: Glaucoma; Optic Neuritis Hematologic/Lymphatic Medical History: Positive for: Anemia Negative for: Hemophilia; Human Immunodeficiency Virus; Lymphedema; Sickle Cell Disease Endocrine Medical History: Positive for: Type II Diabetes Negative for: Type I Diabetes Time with diabetes: no idea Treated with: Oral agents Blood sugar tested every day: No Genitourinary [name], [name] ([id number]) Medical History: Negative for: End Stage Renal Disease Immunological Medical History: Negative for: Lupus Erythematosus; Raynaudos; Scleroderma Integumentary (Skin) Medical History: Negative for: History of Burn; History of pressure wounds Musculoskeletal Medical History: Positive for: Osteoarthritis Negative for: Gout; Rheumatoid  Arthritis; Osteomyelitis Neurologic Medical History: Negative for: Dementia; Neuropathy; Quadriplegia; Paraplegia; Seizure Disorder Oncologic Medical History: Negative for:  Received Chemotherapy; Received Radiation HBO Extended History Items Eyes: Cataracts Immunizations Pneumococcal Vaccine: Received Pneumococcal Vaccination: Yes Implantable Devices No devices added Family and Social History Cancer: No; Diabetes: Yes - Father,Siblings,Child; Heart Disease: No; Hypertension: Yes - Child; Kidney Disease: No; Lung Disease: Yes - Child; Seizures: No; Stroke: Yes - Mother; Thyroid Problems: No; Former smoker; Marital Status - Single; Alcohol Use: Never; Drug Use: No History; Caffeine Use: Daily; Financial Concerns: No; Food, Clothing or Shelter Needs: No; Support System Lacking: No; Transportation Concerns: No Physician Affirmation I have reviewed and agree with the above information. Electronic Signature(s) Signed: 08/21/2018 1:42:22 PM By: Worthy Keeler PA-C Signed: 08/21/2018 4:39:00 PM By: Harold Barban Entered By: Worthy Keeler on 08/21/2018 11:30:51 Wiederhold, [name] ([id number]) -------------------------------------------------------------------------------- SuperBill Details Patient Name: Delio, Mabrey C. Date of Service: 08/20/2018 Medical Record Number: [id number] Patient Account Number: [id number] Date of Birth/Sex: 1927/08/10 (85 y.o. F) Treating RN: Harold Barban Primary Care Provider: Ria Bush Other Clinician: Referring Provider: Ria Bush Treating Provider/Extender: Melburn Hake, Jantz Main Weeks in Treatment: 21 Diagnosis Coding ICD-10 Codes Code Description I87.331 Chronic venous hypertension (idiopathic) with ulcer and inflammation of right lower extremity E11.622 Type 2 diabetes mellitus with other skin ulcer L97.812 Non-pressure chronic ulcer of other part of right lower leg with fat layer exposed L03.115 Cellulitis of right lower limb Facility Procedures CPT4 Code: 17510258 Description: 52778 - WOUND CARE VISIT-LEV 2 EST PT Modifier: Quantity: 1 Physician Procedures CPT4: Description Modifier Quantity Code  [id number] - WC PHYS LEVEL 4 - EST PT 1 ICD-10 Diagnosis Description I87.331 Chronic venous hypertension (idiopathic) with ulcer and inflammation of right lower extremity E11.622 Type 2 diabetes mellitus with  other skin ulcer L97.812 Non-pressure chronic ulcer of other part of right lower leg with fat layer exposed L03.115 Cellulitis of right lower limb Electronic Signature(s) Signed: 08/21/2018 1:42:22 PM By: Worthy Keeler PA-C Entered By: Worthy Keeler on 08/21/2018 11:31:56

## 2018-08-21 NOTE — Progress Notes (Signed)
CARETHA, RUMBAUGH ([id number]) Visit Report for 08/20/2018 Arrival Information Details Patient Name: [name], [name] Date of Service: 08/20/2018 2:00 PM Medical Record Number: [id number] Patient Account Number: [id number] Date of Birth/Sex: Jan 30, 1928 (85 y.o. F) Treating RN: Harold Barban Primary Care Jearldine Cassady: Ria Bush Other Clinician: Referring Glynda Soliday: Ria Bush Treating Niko Penson/Extender: Melburn Hake, HOYT Weeks in Treatment: 21 Visit Information History Since Last Visit Added or deleted any medications: No Patient Arrived: [place] Any new allergies or adverse reactions: No Arrival Time: 14:04 Had a fall or experienced change in No Accompanied By: self activities of daily living that may affect Transfer Assistance: None risk of falls: Patient Identification Verified: Yes Signs or symptoms of abuse/neglect since last visito No Secondary Verification Process Completed: Yes Hospitalized since last visit: No Implantable device outside of the clinic excluding No cellular tissue based products placed in the center since last visit: Has Dressing in Place as Prescribed: Yes Pain Present Now: No Electronic Signature(s) Signed: 08/21/2018 1:59:56 PM By: Lorine Bears RCP, RRT, CHT Entered By: Becky Sax, Amado Nash on 08/20/2018 14:06:05 Augsburger, [name] ([id number]) -------------------------------------------------------------------------------- Clinic Level of Care Assessment Details Patient Name: [name], [name] Date of Service: 08/20/2018 2:00 PM Medical Record Number: [id number] Patient Account Number: [id number] Date of Birth/Sex: 26-Oct-1927 (85 y.o. F) Treating RN: Cornell Barman Primary Care Mithran Strike: Ria Bush Other Clinician: Referring Tykerria Mccubbins: Ria Bush Treating Mateen Franssen/Extender: Melburn Hake, HOYT Weeks in Treatment: 21 Clinic Level of Care Assessment Items TOOL 4 Quantity Score []  - Use when only an EandM is  performed on FOLLOW-UP visit 0 ASSESSMENTS - Nursing Assessment / Reassessment []  - Reassessment of Co-morbidities (includes updates in patient status) 0 X- 1 5 Reassessment of Adherence to Treatment Plan ASSESSMENTS - Wound and Skin Assessment / Reassessment X - Simple Wound Assessment / Reassessment - one wound 1 5 []  - 0 Complex Wound Assessment / Reassessment - multiple wounds []  - 0 Dermatologic / Skin Assessment (not related to wound area) ASSESSMENTS - Focused Assessment []  - Circumferential Edema Measurements - multi extremities 0 []  - 0 Nutritional Assessment / Counseling / Intervention []  - 0 Lower Extremity Assessment (monofilament, tuning fork, pulses) []  - 0 Peripheral Arterial Disease Assessment (using hand held doppler) ASSESSMENTS - Ostomy and/or Continence Assessment and Care []  - Incontinence Assessment and Management 0 []  - 0 Ostomy Care Assessment and Management (repouching, etc.) PROCESS - Coordination of Care X - Simple Patient / Family Education for ongoing care 1 15 []  - 0 Complex (extensive) Patient / Family Education for ongoing care []  - 0 Staff obtains Programmer, systems, Records, Test Results / Process Orders X- 1 10 Staff telephones HHA, Nursing Homes / Clarify orders / etc []  - 0 Routine Transfer to another Facility (non-emergent condition) []  - 0 Routine Hospital Admission (non-emergent condition) []  - 0 New Admissions / Biomedical engineer / Ordering NPWT, Apligraf, etc. []  - 0 Emergency Hospital Admission (emergent condition) X- 1 10 Simple Discharge Coordination []  - 0 Complex (extensive) Discharge Coordination PROCESS - Special Needs []  - Pediatric / Minor Patient Management 0 []  - 0 Isolation Patient Management [name], [name] ([id number]) []  - 0 Hearing / Language / Visual special needs []  - 0 Assessment of Community assistance (transportation, D/C planning, etc.) []  - 0 Additional assistance / Altered mentation []  - 0 Support  Surface(s) Assessment (bed, cushion, seat, etc.) INTERVENTIONS - Wound Cleansing / Measurement []  - Simple Wound Cleansing - one wound 0 []  - 0 Complex Wound Cleansing - multiple wounds X- 1  5 Wound Imaging (photographs - any number of wounds) []  - 0 Wound Tracing (instead of photographs) X- 1 5 Simple Wound Measurement - one wound []  - 0 Complex Wound Measurement - multiple wounds INTERVENTIONS - Wound Dressings []  - Small Wound Dressing one or multiple wounds 0 X- 1 15 Medium Wound Dressing one or multiple wounds []  - 0 Large Wound Dressing one or multiple wounds []  - 0 Application of Medications - topical []  - 0 Application of Medications - injection INTERVENTIONS - Miscellaneous []  - External ear exam 0 []  - 0 Specimen Collection (cultures, biopsies, blood, body fluids, etc.) []  - 0 Specimen(s) / Culture(s) sent or taken to Lab for analysis []  - 0 Patient Transfer (multiple staff / Civil Service fast streamer / Similar devices) []  - 0 Simple Staple / Suture removal (25 or less) []  - 0 Complex Staple / Suture removal (26 or more) []  - 0 Hypo / Hyperglycemic Management (close monitor of Blood Glucose) []  - 0 Ankle / Brachial Index (ABI) - do not check if billed separately X- 1 5 Vital Signs Has the patient been seen at the hospital within the last three years: Yes Total Score: 75 Level Of Care: New/Established - Level 2 Electronic Signature(s) Signed: 08/20/2018 4:57:57 PM By: Gretta Cool, BSN, RN, CWS, Kim RN, BSN Entered By: Gretta Cool, BSN, RN, CWS, Kim on 08/20/2018 15:19:23 Leidner, [name] ([id number]) -------------------------------------------------------------------------------- Encounter Discharge Information Details Patient Name: [name], Joye C. Date of Service: 08/20/2018 2:00 PM Medical Record Number: [id number] Patient Account Number: [id number] Date of Birth/Sex: Sep 04, 1927 (85 y.o. F) Treating RN: Cornell Barman Primary Care Wang Granada: Ria Bush Other  Clinician: Referring Shamina Etheridge: Ria Bush Treating Lucifer Soja/Extender: Melburn Hake, HOYT Weeks in Treatment: 21 Encounter Discharge Information Items Discharge Condition: Stable Ambulatory Status: Ambulatory Discharge Destination: Home Transportation: Private Auto Accompanied By: self Schedule Follow-up Appointment: No Clinical Summary of Care: Electronic Signature(s) Signed: 08/20/2018 4:57:57 PM By: Gretta Cool, BSN, RN, CWS, Kim RN, BSN Entered By: Gretta Cool, BSN, RN, CWS, Kim on 08/20/2018 15:21:29 Petrosky, [name] ([id number]) -------------------------------------------------------------------------------- Lower Extremity Assessment Details Patient Name: Siedlecki, Elouise C. Date of Service: 08/20/2018 2:00 PM Medical Record Number: [id number] Patient Account Number: [id number] Date of Birth/Sex: 14-Jul-1927 (85 y.o. F) Treating RN: Harold Barban Primary Care Wynema Garoutte: Ria Bush Other Clinician: Referring Giavonni Fonder: Ria Bush Treating Joyce Leckey/Extender: STONE III, HOYT Weeks in Treatment: 21 Vascular Assessment Pulses: Dorsalis Pedis Palpable: [Right:Yes] Posterior Tibial Palpable: [Right:Yes] Electronic Signature(s) Signed: 08/20/2018 4:38:46 PM By: Harold Barban Entered By: Harold Barban on 08/20/2018 14:32:02 Rideaux, Estera C. ([id number]) -------------------------------------------------------------------------------- Multi Wound Chart Details Patient Name: Weed, Dilan C. Date of Service: 08/20/2018 2:00 PM Medical Record Number: [id number] Patient Account Number: [id number] Date of Birth/Sex: 01-21-1928 (85 y.o. F) Treating RN: Cornell Barman Primary Care Adger Cantera: Ria Bush Other Clinician: Referring Tayler Heiden: Ria Bush Treating Taye Cato/Extender: Melburn Hake, HOYT Weeks in Treatment: 21 Vital Signs Height(in): 58 Pulse(bpm): 67 Weight(lbs): 140 Blood Pressure(mmHg): 125/74 Body Mass Index(BMI): 29 Temperature(F): 98.3 Respiratory  Rate 16 (breaths/min): Photos: [N/A:N/A] Wound Location: Right Lower Leg - Lateral N/A N/A Wounding Event: Trauma N/A N/A Primary Etiology: Diabetic Wound/Ulcer of the N/A N/A Lower Extremity Comorbid History: Cataracts, Anemia, N/A N/A Arrhythmia, Coronary Artery Disease, Hypertension, Myocardial Infarction, Type II Diabetes, Osteoarthritis Date Acquired: 03/09/2018 N/A N/A Weeks of Treatment: 21 N/A N/A Wound Status: Open N/A N/A Measurements L x W x D 2.6x1x0.1 N/A N/A (cm) Area (cm) : 2.042 N/A N/A Volume (cm) : 0.204 N/A N/A % Reduction in Area: -8.30% N/A N/A %  Reduction in Volume: 63.90% N/A N/A Classification: Grade 2 N/A N/A Exudate Amount: Medium N/A N/A Exudate Type: Serous N/A N/A Exudate Color: amber N/A N/A Wound Margin: Flat and Intact N/A N/A Granulation Amount: Small (1-33%) N/A N/A Granulation Quality: Pink N/A N/A Necrotic Amount: Medium (34-66%) N/A N/A Exposed Structures: Fat Layer (Subcutaneous N/A N/A Tissue) Exposed: Yes Fascia: No Tendon: No Muscle: No Joint: No Bone: No Epithelialization: Small (1-33%) N/A N/A Treatment Notes AURA, BIBBY ([id number]) Electronic Signature(s) Signed: 08/20/2018 4:57:57 PM By: Gretta Cool, BSN, RN, CWS, Kim RN, BSN Entered By: Gretta Cool, BSN, RN, CWS, Kim on 08/20/2018 15:18:50 Abreu, [name] ([id number]) -------------------------------------------------------------------------------- [place] Details Patient Name: Mallinger, Marabeth C. Date of Service: 08/20/2018 2:00 PM Medical Record Number: [id number] Patient Account Number: [id number] Date of Birth/Sex: 1927/12/05 (85 y.o. F) Treating RN: Harold Barban Primary Care Serrena Linderman: Ria Bush Other Clinician: Referring Larita Deremer: Ria Bush Treating Dajour Pierpoint/Extender: Melburn Hake, HOYT Weeks in Treatment: 21 Active Inactive Abuse / Safety / Falls / Self Care Management Nursing Diagnoses: Potential for falls Goals: Patient will  not experience any injury related to falls Date Initiated: 03/23/2018 Target Resolution Date: 06/09/2018 Goal Status: Active Interventions: Assess fall risk on admission and as needed Notes: Orientation to the Wound Care Program Nursing Diagnoses: Knowledge deficit related to the wound healing center program Goals: Patient/caregiver will verbalize understanding of the [place] Program Date Initiated: 03/23/2018 Target Resolution Date: 06/09/2018 Goal Status: Active Interventions: Provide education on orientation to the wound center Notes: Pain, Acute or Chronic Nursing Diagnoses: Pain, acute or chronic: actual or potential Goals: Patient will verbalize adequate pain control and receive pain control interventions during procedures as needed Date Initiated: 03/23/2018 Target Resolution Date: 06/09/2018 Goal Status: Active Interventions: Assess comfort goal upon admission Notes: Wound/Skin Impairment Nursing Diagnoses: Impaired tissue integrity Plotts, Sanai C. ([id number]) Goals: Ulcer/skin breakdown will heal within 14 weeks Date Initiated: 03/23/2018 Target Resolution Date: 06/09/2018 Goal Status: Active Interventions: Assess patient/caregiver ability to obtain necessary supplies Assess patient/caregiver ability to perform ulcer/skin care regimen upon admission and as needed Assess ulceration(s) every visit Notes: Electronic Signature(s) Signed: 08/20/2018 4:38:46 PM By: Harold Barban Entered By: Harold Barban on 08/20/2018 15:17:45 Douthit, Davida C. ([id number]) -------------------------------------------------------------------------------- Pain Assessment Details Patient Name: Suire, Caldonia C. Date of Service: 08/20/2018 2:00 PM Medical Record Number: [id number] Patient Account Number: [id number] Date of Birth/Sex: 05/18/1927 (85 y.o. F) Treating RN: Harold Barban Primary Care Meshilem Machuca: Ria Bush Other Clinician: Referring Ewelina Naves: Ria Bush Treating Amazing Cowman/Extender: Melburn Hake, HOYT Weeks in Treatment: 21 Active Problems Location of Pain Severity and Description of Pain Patient Has [name] No Site Locations Pain Management and Medication Current Pain Management: Electronic Signature(s) Signed: 08/20/2018 4:38:46 PM By: Harold Barban Signed: 08/21/2018 1:59:56 PM By: Lorine Bears RCP, RRT, CHT Entered By: Lorine Bears on 08/20/2018 14:06:15 Smith, [name] ([id number]) -------------------------------------------------------------------------------- Patient/Caregiver Education Details Patient Name: Covello, Nylia C. Date of Service: 08/20/2018 2:00 PM Medical Record Number: [id number] Patient Account Number: [id number] Date of Birth/Gender: 09/28/1927 (85 y.o. F) Treating RN: Cornell Barman Primary Care Physician: Ria Bush Other Clinician: Referring Physician: Ria Bush Treating Physician/Extender: Sharalyn Ink in Treatment: 21 Education Assessment Education Provided To: Patient Education Topics Provided Wound/Skin Impairment: Handouts: Caring for Your Ulcer Methods: Demonstration, Explain/Verbal Responses: State content correctly Electronic Signature(s) Signed: 08/20/2018 4:57:57 PM By: Gretta Cool, BSN, RN, CWS, Kim RN, BSN Entered By: Gretta Cool, BSN, RN, CWS, Kim on 08/20/2018 15:19:44 Schlack, [name] ([id number]) -------------------------------------------------------------------------------- Wound Assessment Details Patient  Name: Rathel, Mallary C. Date of Service: 08/20/2018 2:00 PM Medical Record Number: [id number] Patient Account Number: [id number] Date of Birth/Sex: 16-Jul-1927 (85 y.o. F) Treating RN: Harold Barban Primary Care Denika Krone: Ria Bush Other Clinician: Referring Melvie Paglia: Ria Bush Treating Tyniya Kuyper/Extender: STONE III, HOYT Weeks in Treatment: 21 Wound Status Wound Number: 8 Primary Diabetic Wound/Ulcer of the Lower  Extremity Etiology: Wound Location: Right Lower Leg - Lateral Wound Open Wounding Event: Trauma Status: Date Acquired: 03/09/2018 Comorbid Cataracts, Anemia, Arrhythmia, Coronary Artery Weeks Of Treatment: 21 History: Disease, Hypertension, Myocardial Infarction, Clustered Wound: No Type II Diabetes, Osteoarthritis Photos Wound Measurements Length: (cm) 2.6 Width: (cm) 1 Depth: (cm) 0.1 Area: (cm) 2.042 Volume: (cm) 0.204 % Reduction in Area: -8.3% % Reduction in Volume: 63.9% Epithelialization: Small (1-33%) Tunneling: No Undermining: No Wound Description Classification: Grade 2 Wound Margin: Flat and Intact Exudate Amount: Medium Exudate Type: Serous Exudate Color: amber Foul Odor After Cleansing: No Slough/Fibrino Yes Wound Bed Granulation Amount: Small (1-33%) Exposed Structure Granulation Quality: Pink Fascia Exposed: No Necrotic Amount: Medium (34-66%) Fat Layer (Subcutaneous Tissue) Exposed: Yes Necrotic Quality: Adherent Slough Tendon Exposed: No Muscle Exposed: No Joint Exposed: No Bone Exposed: No Treatment Notes Wound #8 (Right, Lateral Lower Leg) 1. Cleansed with: Clean wound with Normal Saline 2. Anesthetic Topical Lidocaine 4% cream to wound bed prior to debridement 4. Dressing Applied: Other dressing (specify in notes) Matsen, Jovani C. ([id number]) Notes Neosporin, telfa, conform secured with tape. Patient refused Tubigrip-states she will not wear because she does not like it! Electronic Signature(s) Signed: 08/20/2018 4:38:46 PM By: Harold Barban Entered By: Harold Barban on 08/20/2018 14:31:31 Hett, [name] ([id number]) -------------------------------------------------------------------------------- [place] Details Patient Name: Basaldua, Natallia C. Date of Service: 08/20/2018 2:00 PM Medical Record Number: [id number] Patient Account Number: [id number] Date of Birth/Sex: 22-Jun-1927 (85 y.o. F) Treating RN: Harold Barban Primary  Care Leianna Barga: Ria Bush Other Clinician: Referring Evamae Rowen: Ria Bush Treating Tena Linebaugh/Extender: Melburn Hake, HOYT Weeks in Treatment: 21 Vital Signs Time Taken: 14:06 Temperature (F): 98.3 Height (in): 58 Pulse (bpm): 67 Weight (lbs): 140 Respiratory Rate (breaths/min): 16 Body Mass Index (BMI): 29.3 Blood Pressure (mmHg): 125/74 Reference Range: 80 - 120 mg / dl Electronic Signature(s) Signed: 08/21/2018 1:59:56 PM By: Lorine Bears RCP, RRT, CHT Entered By: Lorine Bears on 08/20/2018 14:10:06

## 2018-08-21 NOTE — Assessment & Plan Note (Signed)
blood pressure control important in reducing the progression of atherosclerotic disease. On appropriate oral medications.  

## 2018-08-21 NOTE — Assessment & Plan Note (Signed)
blood glucose control important in reducing the progression of atherosclerotic disease. Also, involved in wound healing. On appropriate medications.  

## 2018-08-21 NOTE — Assessment & Plan Note (Signed)
she had noninvasive studies performed as a lab only study in our office without seeing a provider.  These demonstrated significantly reduced ABIs of 0.67 on the right and 0.65 on the left.  Her digital tracings were essentially flat.  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  This is a critical and limb threatening situation.  We discussed options and revascularization of her arterial insufficiency would generally be recommended to promote wound healing.  Treatment for her venous disease can then follow treatment for the arterial disease if needed.  I discussed the reason and rationale for arteriogram with possible intervention.  I discussed the procedure in detail.  We have discussed the risks and benefits.  The patient is agreeable to proceed and we are scheduling her for right lower extremity revascularization on a date she has chosen in the near future.

## 2018-08-23 ENCOUNTER — Encounter (INDEPENDENT_AMBULATORY_CARE_PROVIDER_SITE_OTHER): Payer: Self-pay

## 2018-08-30 ENCOUNTER — Other Ambulatory Visit: Payer: Self-pay

## 2018-08-30 ENCOUNTER — Encounter: Payer: Medicare Other | Admitting: Physician Assistant

## 2018-08-30 ENCOUNTER — Telehealth (INDEPENDENT_AMBULATORY_CARE_PROVIDER_SITE_OTHER): Payer: Self-pay

## 2018-08-30 NOTE — Telephone Encounter (Signed)
Patient let a message for a return call about her procedure. I returned the call and the patient was a [name] confused about where she should go for her procedure and testing. I discussed the testing and going to the [facility] for the test as well as going to the [facility] for her procedure. The patient does have the pre-procedure instructions that was mailed out to her, she is confused as to where the buildings are.

## 2018-09-02 ENCOUNTER — Other Ambulatory Visit (INDEPENDENT_AMBULATORY_CARE_PROVIDER_SITE_OTHER): Payer: Self-pay | Admitting: Nurse Practitioner

## 2018-09-03 ENCOUNTER — Encounter
Admission: RE | Admit: 2018-09-03 | Discharge: 2018-09-03 | Disposition: A | Discharge status: Home / Self Care | Payer: Medicare Other | Source: Ambulatory Visit | Attending: Vascular Surgery | Admitting: Vascular Surgery

## 2018-09-03 ENCOUNTER — Other Ambulatory Visit: Payer: Self-pay | Admitting: Family Medicine

## 2018-09-03 ENCOUNTER — Telehealth: Payer: Self-pay

## 2018-09-03 ENCOUNTER — Other Ambulatory Visit: Payer: Self-pay

## 2018-09-03 ENCOUNTER — Ambulatory Visit: Payer: Medicare Other | Admitting: Endocrinology

## 2018-09-03 DIAGNOSIS — Z7982 Long term (current) use of aspirin: Secondary | ICD-10-CM | POA: Diagnosis not present

## 2018-09-03 DIAGNOSIS — Z833 Family history of diabetes mellitus: Secondary | ICD-10-CM | POA: Diagnosis not present

## 2018-09-03 DIAGNOSIS — Z90710 Acquired absence of both cervix and uterus: Secondary | ICD-10-CM | POA: Diagnosis not present

## 2018-09-03 DIAGNOSIS — E1151 Type 2 diabetes mellitus with diabetic peripheral angiopathy without gangrene: Secondary | ICD-10-CM | POA: Diagnosis not present

## 2018-09-03 DIAGNOSIS — Z1159 Encounter for screening for other viral diseases: Secondary | ICD-10-CM | POA: Insufficient documentation

## 2018-09-03 DIAGNOSIS — E785 Hyperlipidemia, unspecified: Secondary | ICD-10-CM | POA: Diagnosis not present

## 2018-09-03 DIAGNOSIS — I5030 Unspecified diastolic (congestive) heart failure: Secondary | ICD-10-CM | POA: Diagnosis not present

## 2018-09-03 DIAGNOSIS — I70238 Atherosclerosis of native arteries of right leg with ulceration of other part of lower right leg: Secondary | ICD-10-CM | POA: Diagnosis not present

## 2018-09-03 DIAGNOSIS — Z79899 Other long term (current) drug therapy: Secondary | ICD-10-CM | POA: Diagnosis not present

## 2018-09-03 DIAGNOSIS — L539 Erythematous condition, unspecified: Secondary | ICD-10-CM

## 2018-09-03 DIAGNOSIS — Z951 Presence of aortocoronary bypass graft: Secondary | ICD-10-CM | POA: Diagnosis not present

## 2018-09-03 DIAGNOSIS — L97211 Non-pressure chronic ulcer of right calf limited to breakdown of skin: Secondary | ICD-10-CM | POA: Diagnosis not present

## 2018-09-03 DIAGNOSIS — Z888 Allergy status to other drugs, medicaments and biological substances status: Secondary | ICD-10-CM | POA: Diagnosis not present

## 2018-09-03 DIAGNOSIS — K589 Irritable bowel syndrome without diarrhea: Secondary | ICD-10-CM | POA: Diagnosis not present

## 2018-09-03 DIAGNOSIS — I110 Hypertensive heart disease with heart failure: Secondary | ICD-10-CM | POA: Diagnosis not present

## 2018-09-03 DIAGNOSIS — I83212 Varicose veins of right lower extremity with both ulcer of calf and inflammation: Secondary | ICD-10-CM | POA: Diagnosis not present

## 2018-09-03 DIAGNOSIS — Z8249 Family history of ischemic heart disease and other diseases of the circulatory system: Secondary | ICD-10-CM | POA: Diagnosis not present

## 2018-09-03 DIAGNOSIS — Z87891 Personal history of nicotine dependence: Secondary | ICD-10-CM | POA: Diagnosis not present

## 2018-09-03 DIAGNOSIS — E11621 Type 2 diabetes mellitus with foot ulcer: Secondary | ICD-10-CM | POA: Diagnosis not present

## 2018-09-03 DIAGNOSIS — Z01812 Encounter for preprocedural laboratory examination: Secondary | ICD-10-CM | POA: Insufficient documentation

## 2018-09-03 DIAGNOSIS — Z90722 Acquired absence of ovaries, bilateral: Secondary | ICD-10-CM | POA: Diagnosis not present

## 2018-09-03 DIAGNOSIS — Z823 Family history of stroke: Secondary | ICD-10-CM | POA: Diagnosis not present

## 2018-09-03 NOTE — Telephone Encounter (Signed)
plz get more details. Is it more painful or itchy? Is it scaly? Round? Is it spreading? Any fever or other symptoms? Would have her try nystatin.  Let me know if needs Rx.

## 2018-09-03 NOTE — Telephone Encounter (Signed)
The patient has called and left a message stating she doesn't know what to do or where to go. The patient does have her paperwork and I again went over the instructions and explained the steps she needs to take for her procedure on 09/06/2018 with Dr. Lucky Cowboy. I also went over her arrival time of 8:15 am as well.

## 2018-09-03 NOTE — Telephone Encounter (Signed)
Pt noticed a red painful area between her breasts. Used Neosporin on it last night with no relief. Pt asking what else she could try for it.

## 2018-09-04 ENCOUNTER — Ambulatory Visit (INDEPENDENT_AMBULATORY_CARE_PROVIDER_SITE_OTHER): Payer: Medicare Other | Admitting: Endocrinology

## 2018-09-04 ENCOUNTER — Encounter: Payer: Self-pay | Admitting: Endocrinology

## 2018-09-04 VITALS — BP 141/72 | HR 61 | Ht <= 58 in | Wt 140.4 lb

## 2018-09-04 DIAGNOSIS — E1165 Type 2 diabetes mellitus with hyperglycemia: Secondary | ICD-10-CM

## 2018-09-04 DIAGNOSIS — R5383 Other fatigue: Secondary | ICD-10-CM

## 2018-09-04 DIAGNOSIS — I7025 Atherosclerosis of native arteries of other extremities with ulceration: Secondary | ICD-10-CM | POA: Diagnosis not present

## 2018-09-04 LAB — COMPREHENSIVE METABOLIC PANEL
ALT: 15 U/L (ref 0–35)
AST: 20 U/L (ref 0–37)
Albumin: 3.7 g/dL (ref 3.5–5.2)
Alkaline Phosphatase: 114 U/L (ref 39–117)
BUN: 20 mg/dL (ref 6–23)
CO2: 33 mEq/L — ABNORMAL HIGH (ref 19–32)
Calcium: 9.2 mg/dL (ref 8.4–10.5)
Chloride: 101 mEq/L (ref 96–112)
Creatinine, Ser: 0.86 mg/dL (ref 0.40–1.20)
GFR: 62.1 mL/min (ref >60.00)
Glucose, Bld: 86 mg/dL (ref 70–99)
Potassium: 4.6 mEq/L (ref 3.5–5.1)
Sodium: 138 mEq/L (ref 135–145)
Total Bilirubin: 0.4 mg/dL (ref 0.2–1.2)
Total Protein: 6.4 g/dL (ref 6.0–8.3)

## 2018-09-04 LAB — URINALYSIS, ROUTINE W REFLEX MICROSCOPIC
Bilirubin Urine: NEGATIVE
Hgb urine dipstick: NEGATIVE
Ketones, ur: NEGATIVE
Leukocytes,Ua: NEGATIVE
Nitrite: NEGATIVE
RBC / HPF: NONE SEEN (ref 0–?)
Specific Gravity, Urine: 1.02 (ref 1.000–1.030)
Total Protein, Urine: NEGATIVE
Urine Glucose: NEGATIVE
Urobilinogen, UA: 0.2 (ref 0.0–1.0)
WBC, UA: NONE SEEN (ref 0–?)
pH: 5.5 (ref 5.0–8.0)

## 2018-09-04 LAB — NOVEL CORONAVIRUS, NAA (HOSP ORDER, SEND-OUT TO REF LAB; TAT 18-24 HRS): SARS-CoV-2, NAA: NOT DETECTED

## 2018-09-04 LAB — LIPID PANEL
Cholesterol: 122 mg/dL (ref 0–200)
HDL: 50.2 mg/dL (ref >39.00)
LDL Cholesterol: 51 mg/dL (ref 0–99)
NonHDL: 71.4
Total CHOL/HDL Ratio: 2
Triglycerides: 104 mg/dL (ref 0.0–149.0)
VLDL: 20.8 mg/dL (ref 0.0–40.0)

## 2018-09-04 LAB — POCT GLYCOSYLATED HEMOGLOBIN (HGB A1C): Hemoglobin A1C: 6.7 % — AB (ref 4.0–5.6)

## 2018-09-04 LAB — MICROALBUMIN / CREATININE URINE RATIO
Creatinine,U: 45.9 mg/dL
Microalb Creat Ratio: 1.5 mg/g (ref 0.0–30.0)
Microalb, Ur: <0.7 mg/dL (ref 0.0–1.9)

## 2018-09-04 LAB — TSH: TSH: 2.63 u[IU]/mL (ref 0.35–4.50)

## 2018-09-04 LAB — GLUCOSE, POCT (MANUAL RESULT ENTRY): POC Glucose: 92 mg/dl (ref 70–99)

## 2018-09-04 MED ORDER — NYSTATIN 100000 UNIT/GM EX CREA: 1.0000 "application " | TOPICAL_CREAM | Freq: Two times a day (BID) | CUTANEOUS | 0 refills | Status: DC

## 2018-09-04 NOTE — Telephone Encounter (Signed)
Spoke with pt relaying Dr. G's message.  Pt verbalizes understanding and expresses her thanks.  

## 2018-09-04 NOTE — Telephone Encounter (Signed)
Painful   Not scaly  Its round  No spreading She keeps saying right between breast  Please advise  Best number [phone]

## 2018-09-04 NOTE — Telephone Encounter (Signed)
Try nystatin - sent to pharmacy. If no better let us know for further evaluation - may need office visit.

## 2018-09-04 NOTE — Telephone Encounter (Signed)
Dr. Darnell Level please advise

## 2018-09-04 NOTE — Progress Notes (Signed)
[name], female   DOB: [birth year], 85 y.o.   MRN: [id number]   Reason for Appointment: Diabetes follow-up   History of Present Illness   Diagnosis: Type 2 DIABETES MELITUS, date of diagnosis: 1991   She has had mild diabetes for several years and has been on Actos only since about 2001 Her A1c has generally been around or under 7% She was given a trial of metformin because of her difficulty with weight loss and also edema but she felt that it made her blood sugars go up and she will not try it again Also she thinks her blood sugars were higher with a trial of Januvia  RECENT history:  A1c has been variable ranging from 6.1-7 It is now relatively high at 6.7  She is continuing to be taking Actos 30 mg daily since 01/2014 and Januvia 50 mg daily  Current blood sugar patterns, day-to-day management and problems identified:   She was started on Januvia in 10/19 because of hyperglycemia on Actos alone  She does not know what medications she is taking  However previously because of cost and fear of side effects she would take only half a tablet of Januvia  Also forgets to bring her monitor and likely forgets to check her sugars also  Not clear why her A1c is increased from her last visit when it was 6.1  She thinks however that she has not had any blood sugars over 150 at home  In the past has checked blood sugars mostly in the afternoons only  She does not think she has changed her diet, has had only mild weight gain recently         Side effects from medications: None  Monitors blood glucose: ?  Frequency .    Glucometer: One Touch.           Blood Glucose readings at home reportedly range 95-150          Meals: 2  meals per day usually Physical activity:  minimal            Dietician visit: Most recent: Several years ago             Wt Readings from Last 3 Encounters:  09/04/18 140 lb 6.4 oz (63.7 kg)  08/21/18 136 lb (61.7 kg)   07/18/18 138 lb (62.6 kg)   Lab Results  Component Value Date   HGBA1C 6.7 (A) 09/04/2018   HGBA1C 6.1 (A) 04/11/2018   HGBA1C 7.0 (A) 01/15/2018   Lab Results  Component Value Date   MICROALBUR <0.7 09/04/2018   LDLCALC 51 09/04/2018   CREATININE 0.86 09/04/2018     Allergies as of 09/04/2018      Reactions   Metformin And Related Diarrhea      Medication List       Accurate as of September 04, 2018  8:54 PM. If you have any questions, ask your nurse or doctor.        acetaminophen 500 MG tablet Commonly known as:  TYLENOL Take 500 mg by mouth every 6 (six) hours as needed for mild pain.   Align 4 MG Caps Take 1 capsule (4 mg total) by mouth daily.   ALPRAZolam 1 MG tablet Commonly known as:  XANAX Take 0.5 mg by mouth 4 (four) times daily as needed for anxiety.   aspirin EC 81 MG tablet Take 81 mg by mouth daily.   collagenase ointment Commonly known as:  SANTYL Apply topically daily.   diclofenac sodium 1 % Gel Commonly known as:  VOLTAREN Apply 1 application topically 3 (three) times daily.   famotidine 20 MG tablet Commonly known as:  Pepcid Take 1 tablet (20 mg total) by mouth at bedtime.   furosemide 20 MG tablet Commonly known as:  LASIX TAKE 0.5 TABLETS (10 MG TOTAL) BY MOUTH DAILY.   glucosamine-chondroitin 500-400 MG tablet Take 1 tablet by mouth daily.   glucose blood test strip Commonly known as:  ONE TOUCH ULTRA TEST USE AS INSTRUCTED TO CHECK BLOOD SUGAR ONCE A DAY Dx code: E11.9   OneTouch Verio test strip Generic drug:  glucose blood USE TO TEST BLOOD SUGAR ONCE DAILY   HYDROcodone-acetaminophen 5-325 MG tablet Commonly known as:  NORCO/VICODIN Take 0.5 tablets by mouth every 6 (six) hours as needed for moderate pain.   loperamide 2 MG capsule Commonly known as:  IMODIUM Take 2 mg by mouth 4 (four) times daily as needed for diarrhea or loose stools.   loratadine 10 MG tablet Commonly known as:  CLARITIN Take 10 mg by mouth daily  as needed for allergies.   metoprolol tartrate 25 MG tablet Commonly known as:  LOPRESSOR TAKE ONE AND HALF TABLETS (37.5 MG TOTAL) BY MOUTH 2 (TWO) TIMES DAILY.   multivitamin tablet Take 1 tablet by mouth daily. Patient reports takes a new vitamin to replace her centrum as recommended by her eye doctor.   nitroGLYCERIN 0.4 MG SL tablet Commonly known as:  NITROSTAT Place 1 tablet (0.4 mg total) under the tongue every 5 (five) minutes as needed for chest pain.   nystatin cream Commonly known as:  MYCOSTATIN Apply 1 application topically 2 (two) times daily.   nystatin-triamcinolone ointment Commonly known as:  MYCOLOG APPLY 1 APPLICATION TOPICALLY 2 (TWO) TIMES DAILY. AS NEEDED FOR ITCHING   pantoprazole 40 MG tablet Commonly known as:  PROTONIX TAKE 1 TABLET BY MOUTH EVERY DAY   pioglitazone 30 MG tablet Commonly known as:  ACTOS TAKE 1 TABLET BY MOUTH EVERY DAY   polyethylene glycol powder 17 GM/SCOOP powder Commonly known as:  GLYCOLAX/MIRALAX Take 17 g by mouth daily. Hold for diarrhea   pravastatin 20 MG tablet Commonly known as:  PRAVACHOL TAKE 1 TABLET BY MOUTH EVERY DAY   ramipril 5 MG capsule Commonly known as:  ALTACE TAKE ONE CAPSULE BY MOUTH EVERYDAY FOR BLOOD PRESSURE   senna-docusate 8.6-50 MG tablet Commonly known as:  Senokot-S Take 1 tablet by mouth daily as needed for moderate constipation.   sitaGLIPtin 100 MG tablet Commonly known as:  Januvia Take 1 tablet (100 mg total) by mouth daily. What changed:  additional instructions   traZODone 150 MG tablet Commonly known as:  DESYREL Take 150 mg by mouth at bedtime.   triamcinolone cream 0.1 % Commonly known as:  KENALOG APPLY 1 APPLICATION TOPICALLY 2 (TWO) TIMES DAILY.   vitamin B-12 1000 MCG tablet Commonly known as:  CYANOCOBALAMIN Take 1,000 mcg by mouth daily.   vitamin E 400 UNIT capsule Take 400 Units by mouth daily.       Allergies:  Allergies  Allergen Reactions  .  Metformin And Related Diarrhea    Past Medical History:  Diagnosis Date  . Acute deep vein thrombosis (DVT) of distal vein of right lower extremity ([place]) 12/2013   Acute thrombus of R proximal gastrocnemius vein. Symptomatic provoked distal DVT  . Anxiety   . Cellulitis of leg, left 01/16/2014  . Chronic back pain  03/2012   lumbar DDD with herniation - s/p L S1 transforaminal ESI (10/2012 Dr. Sharlet Salina)  . Chronic venous insufficiency   . Coronary artery disease    CABG 1998  . Cystocele   . Depression   . Diabetes mellitus    type 2, followed by endo.  . Diastolic dysfunction   . Diverticulosis   . Hemorrhoids   . History of heart attack   . Hx of echocardiogram    a. Echo 10/13: mild LVH, EF 55-60%, Gr 1 diast dysfn, PASP 32  . Hyperlipidemia   . Hypertension   . Irritable bowel syndrome   . Microcytic anemia   . PAD (peripheral artery disease) ([place]) 2013   ABI: R 0.91, L 0.83  . PUD (peptic ulcer disease)   . Varicose veins   . Venous ulcer of leg ([place]) 12/12/2013   Required prolonged treatment by wound center  . Viral gastroenteritis due to [place] virus 05/13/2015   hospitalization    Past Surgical History:  Procedure Laterality Date  . ABDOMINAL HYSTERECTOMY  1975   TAH,BSO  . ABI  2013   R 0.91, L 0.83  . APPENDECTOMY     per pt report  . CARDIOVASCULAR STRESS TEST  2015   low risk myoview ([place])  . CATARACT SURGERY    . CHOLECYSTECTOMY    . COLONOSCOPY  02/2010   extensive diverticulosis throughout, small internal hemorrhoids, rec rpt 5 yrs (Dr. Allyn Kenner)  . CORONARY ARTERY BYPASS GRAFT  1998  . dexa scan  09/2011   femur -0.8, forearm 0.6 => normal  . ENDOVENOUS ABLATION SAPHENOUS VEIN W/ LASER Left 03/2014   Kellie Simmering  . ESI  2014   L S1 transforaminal ESI x2 ([place]) without improvement  . HAND SURGERY    . lexiscan myoview  03/2011   Small basal inferolateral and anterolateral reversible perfusion defect suggests ischemia.  EF was normal.   old infarct,  no new ischemia  . toe surgery    . TYMPANOPLASTY  1960s   R side  . Vault prolapse A & P repair  2009   Dr Rhodia Albright [facility]    Family History  Problem Relation Age of Onset  . Hypertension Mother   . Stroke Mother   . Diabetes Father   . Diabetes Sister   . Diabetes Brother   . Heart disease Son   . Colon cancer Neg Hx     Social History:  reports that she quit smoking about 39 years ago. She has never used smokeless tobacco. She reports that she does not drink alcohol or use drugs.  Review of Systems:  No history of thyroid disease but she complains of periodic feelings of hot and cold  Lab Results  Component Value Date   TSH 2.63 09/04/2018   TSH 2.41 10/26/2016   TSH 2.71 12/30/2015   [name]4 0.91 10/26/2016     HYPERTENSION:  well-controlled on Ramipril 5 mg  BP Readings from Last 3 Encounters:  09/04/18 (!) 141/72  08/21/18 (!) 151/74  07/18/18 136/64    HYPERLIPIDEMIA: The lipid abnormality consists of elevated LDL treated with pravastatin This is followed by cardiologist and PCP, needs follow-up  Lab Results  Component Value Date   CHOL 122 09/04/2018   HDL 50.20 09/04/2018   LDLCALC 51 09/04/2018   TRIG 104.0 09/04/2018   CHOLHDL 2 09/04/2018    Last foot exam in 1/20      Examination:   BP (!) 141/72 (BP Location:  Left Arm, Patient Position: Sitting, Cuff Size: Normal)   Pulse 61   Ht 4\' 9"  (1.448 m)   Wt 140 lb 6.4 oz (63.7 kg)   SpO2 95%   BMI 30.38 kg/m   Body mass index is 30.38 kg/m.   ASSESSMENT/ PLAN:   DIABETES type 2 with obesity  A1c is 6.7 which is still adequate for her age  She is supposed to be on Actos and Januvia but she cannot confirm this today She has difficulty remembering her blood sugars from home and also forgets to bring her blood sugar monitor on her visits  She continues to be tolerating that dose well which has been a long-term treatment  She is on Januvia and previously had been taking only 50 mg  using 1/2 tablet This may be appropriate given her last creatinine being relatively high in March  Reminded her to check blood sugars about 3 times a week alternating fasting and postprandial and let us know if they are consistently high  Follow-up labs needed to reassess her renal function, lipids Thyroid levels will be checked because of her complaints of heat and cold intolerance at times   There are no Patient Instructions on file for this visit.   Elayne Snare 09/04/2018, 8:54 PM      Note: This office note was prepared with Dragon voice recognition system technology. Any transcriptional errors that result from this process are unintentional.

## 2018-09-04 NOTE — Progress Notes (Signed)
Please call to let patient know that the lab results are normal and no change needed.  However need to confirm her doses of Actos and Januvia

## 2018-09-05 MED ORDER — CEFAZOLIN SODIUM-DEXTROSE 2-4 GM/100ML-% IV SOLN
2.0000 g | Freq: Once | INTRAVENOUS | Status: AC
  Administered 2018-09-06: 2 g via INTRAVENOUS

## 2018-09-05 NOTE — Progress Notes (Signed)
DANYETTA, GILLHAM ([id number]) Visit Report for 08/30/2018 Arrival Information Details Patient Name: Folden, Tylesha C. Date of Service: 08/30/2018 2:45 PM Medical Record Number: [id number] Patient Account Number: [id number] Date of Birth/Sex: May 30, 1927 (85 y.o. F) Treating RN: Cornell Barman Primary Care Azyiah Bo: Ria Bush Other Clinician: Referring Shanya Ferriss: Ria Bush Treating Jamas Jaquay/Extender: Melburn Hake, HOYT Weeks in Treatment: 1 Visit Information History Since Last Visit Added or deleted any medications: No Patient Arrived: Walker Any new allergies or adverse reactions: No Arrival Time: 14:43 Had a fall or experienced change in No Accompanied By: self activities of daily living that may affect Transfer Assistance: None risk of falls: Patient Identification Verified: Yes Signs or symptoms of abuse/neglect since last visito No Secondary Verification Process Completed: Yes Hospitalized since last visit: No Implantable device outside of the clinic excluding No cellular tissue based products placed in the center since last visit: Has Dressing in Place as Prescribed: Yes Pain Present Now: No Electronic Signature(s) Signed: 09/04/2018 5:54:26 PM By: Gretta Cool, BSN, RN, CWS, Kim RN, BSN Entered By: Gretta Cool, BSN, RN, CWS, Kim on 08/30/2018 14:44:01 Rookstool, [name] ([id number]) -------------------------------------------------------------------------------- Clinic Level of Care Assessment Details Patient Name: Commisso, Gunhild C. Date of Service: 08/30/2018 2:45 PM Medical Record Number: [id number] Patient Account Number: [id number] Date of Birth/Sex: [birth year] (85 y.o. F) Treating RN: Cornell Barman Primary Care Chace Bisch: Ria Bush Other Clinician: Referring Kenneith Stief: Ria Bush Treating Mikiah Demond/Extender: Melburn Hake, HOYT Weeks in Treatment: 22 Clinic Level of Care Assessment Items TOOL 4 Quantity Score []  - Use when only an EandM is performed on FOLLOW-UP  visit 0 ASSESSMENTS - Nursing Assessment / Reassessment []  - Reassessment of Co-morbidities (includes updates in patient status) 0 X- 1 5 Reassessment of Adherence to Treatment Plan ASSESSMENTS - Wound and Skin Assessment / Reassessment X - Simple Wound Assessment / Reassessment - one wound 1 5 []  - 0 Complex Wound Assessment / Reassessment - multiple wounds []  - 0 Dermatologic / Skin Assessment (not related to wound area) ASSESSMENTS - Focused Assessment []  - Circumferential Edema Measurements - multi extremities 0 []  - 0 Nutritional Assessment / Counseling / Intervention []  - 0 Lower Extremity Assessment (monofilament, tuning fork, pulses) []  - 0 Peripheral Arterial Disease Assessment (using hand held doppler) ASSESSMENTS - Ostomy and/or Continence Assessment and Care []  - Incontinence Assessment and Management 0 []  - 0 Ostomy Care Assessment and Management (repouching, etc.) PROCESS - Coordination of Care X - Simple Patient / Family Education for ongoing care 1 15 []  - 0 Complex (extensive) Patient / Family Education for ongoing care X- 1 10 Staff obtains Programmer, systems, Records, Test Results / Process Orders []  - 0 Staff telephones HHA, Nursing Homes / Clarify orders / etc []  - 0 Routine Transfer to another Facility (non-emergent condition) []  - 0 Routine Hospital Admission (non-emergent condition) []  - 0 New Admissions / Biomedical engineer / Ordering NPWT, Apligraf, etc. []  - 0 Emergency Hospital Admission (emergent condition) X- 1 10 Simple Discharge Coordination Bonner, Daisi C. ([id number]) []  - 0 Complex (extensive) Discharge Coordination PROCESS - Special Needs []  - Pediatric / Minor Patient Management 0 []  - 0 Isolation Patient Management []  - 0 Hearing / Language / Visual special needs []  - 0 Assessment of Community assistance (transportation, D/C planning, etc.) []  - 0 Additional assistance / Altered mentation []  - 0 Support Surface(s) Assessment  (bed, cushion, seat, etc.) INTERVENTIONS - Wound Cleansing / Measurement X - Simple Wound Cleansing - one wound 1 5 []  - 0 Complex Wound Cleansing -  multiple wounds X- 1 5 Wound Imaging (photographs - any number of wounds) []  - 0 Wound Tracing (instead of photographs) X- 1 5 Simple Wound Measurement - one wound []  - 0 Complex Wound Measurement - multiple wounds INTERVENTIONS - Wound Dressings []  - Small Wound Dressing one or multiple wounds 0 X- 1 15 Medium Wound Dressing one or multiple wounds []  - 0 Large Wound Dressing one or multiple wounds []  - 0 Application of Medications - topical []  - 0 Application of Medications - injection INTERVENTIONS - Miscellaneous []  - External ear exam 0 []  - 0 Specimen Collection (cultures, biopsies, blood, body fluids, etc.) []  - 0 Specimen(s) / Culture(s) sent or taken to Lab for analysis []  - 0 Patient Transfer (multiple staff / Civil Service fast streamer / Similar devices) []  - 0 Simple Staple / Suture removal (25 or less) []  - 0 Complex Staple / Suture removal (26 or more) []  - 0 Hypo / Hyperglycemic Management (close monitor of Blood Glucose) []  - 0 Ankle / Brachial Index (ABI) - do not check if billed separately X- 1 5 Vital Signs Westerhoff, Flecia C. ([id number]) Has the patient been seen at the hospital within the last three years: Yes Total Score: 80 Level Of Care: New/Established - Level 3 Electronic Signature(s) Signed: 09/04/2018 5:54:26 PM By: Gretta Cool, BSN, RN, CWS, Kim RN, BSN Entered By: Gretta Cool, BSN, RN, CWS, Kim on 08/30/2018 14:59:14 Rorke, [name] ([id number]) -------------------------------------------------------------------------------- Encounter Discharge Information Details Patient Name: Rosenberger, Hannan C. Date of Service: 08/30/2018 2:45 PM Medical Record Number: [id number] Patient Account Number: [id number] Date of Birth/Sex: 12-May-1927 (85 y.o. F) Treating RN: Cornell Barman Primary Care Mihira Tozzi: Ria Bush Other  Clinician: Referring Janille Draughon: Ria Bush Treating Mariyam Remington/Extender: Melburn Hake, HOYT Weeks in Treatment: 22 Encounter Discharge Information Items Discharge Condition: Stable Ambulatory Status: Walker Discharge Destination: Home Transportation: Private Auto Accompanied By: self Schedule Follow-up Appointment: Yes Clinical Summary of Care: Electronic Signature(s) Signed: 09/04/2018 5:54:26 PM By: Gretta Cool, BSN, RN, CWS, Kim RN, BSN Entered By: Gretta Cool, BSN, RN, CWS, Kim on 08/30/2018 15:00:20 Meuth, [name] ([id number]) -------------------------------------------------------------------------------- Lower Extremity Assessment Details Patient Name: Nass, Katilyn C. Date of Service: 08/30/2018 2:45 PM Medical Record Number: [id number] Patient Account Number: [id number] Date of Birth/Sex: Jan 29, 1928 (85 y.o. F) Treating RN: Cornell Barman Primary Care Vandy Tsuchiya: Ria Bush Other Clinician: Referring Zeyad Delaguila: Ria Bush Treating Addysin Porco/Extender: Melburn Hake, HOYT Weeks in Treatment: 22 Vascular Assessment Pulses: Dorsalis Pedis Palpable: [Right:Yes] Electronic Signature(s) Signed: 09/04/2018 5:54:26 PM By: Gretta Cool, BSN, RN, CWS, Kim RN, BSN Entered By: Gretta Cool, BSN, RN, CWS, Kim on 08/30/2018 14:52:21 Alcivar, [name] ([id number]) -------------------------------------------------------------------------------- Multi Wound Chart Details Patient Name: Fulop, Yuli C. Date of Service: 08/30/2018 2:45 PM Medical Record Number: [id number] Patient Account Number: [id number] Date of Birth/Sex: Aug 29, 1927 (85 y.o. F) Treating RN: Cornell Barman Primary Care Martavious Hartel: Ria Bush Other Clinician: Referring Gayanne Prescott: Ria Bush Treating Teira Arcilla/Extender: Melburn Hake, HOYT Weeks in Treatment: 22 Vital Signs Height(in): 58 Pulse(bpm): 62 Weight(lbs): 140 Blood Pressure(mmHg): 126/85 Body Mass Index(BMI): 29 Temperature(F): 98.2 Respiratory  Rate 16 (breaths/min): Photos: [N/A:N/A] Wound Location: Right Lower Leg - Lateral N/A N/A Wounding Event: Trauma N/A N/A Primary Etiology: Diabetic Wound/Ulcer of the N/A N/A Lower Extremity Comorbid History: Cataracts, Anemia, N/A N/A Arrhythmia, Coronary Artery Disease, Hypertension, Myocardial Infarction, Type II Diabetes, Osteoarthritis Date Acquired: 03/09/2018 N/A N/A Weeks of Treatment: 22 N/A N/A Wound Status: Open N/A N/A Measurements L x W x D 2x0.9x0.1 N/A N/A (cm) Area (cm) : 1.414 N/A N/A Volume (cm) :  0.141 N/A N/A % Reduction in Area: 25.00% N/A N/A % Reduction in Volume: 75.00% N/A N/A Classification: Grade 2 N/A N/A Exudate Amount: Medium N/A N/A Exudate Type: Serous N/A N/A Exudate Color: amber N/A N/A Wound Margin: Flat and Intact N/A N/A Granulation Amount: Small (1-33%) N/A N/A Granulation Quality: Pink N/A N/A Necrotic Amount: Medium (34-66%) N/A N/A Exposed Structures: Fat Layer (Subcutaneous N/A N/A Tissue) Exposed: Yes Fascia: No Mcnease, Chevy C. ([id number]) Tendon: No Muscle: No Joint: No Bone: No Epithelialization: Small (1-33%) N/A N/A Treatment Notes Electronic Signature(s) Signed: 09/04/2018 5:54:26 PM By: Gretta Cool, BSN, RN, CWS, Kim RN, BSN Entered By: Gretta Cool, BSN, RN, CWS, Kim on 08/30/2018 14:52:46 Holdman, [name] ([id number]) -------------------------------------------------------------------------------- [place] Details Patient Name: Russon, Analise C. Date of Service: 08/30/2018 2:45 PM Medical Record Number: [id number] Patient Account Number: [id number] Date of Birth/Sex: 1927-04-06 (85 y.o. F) Treating RN: Cornell Barman Primary Care Iyah Laguna: Ria Bush Other Clinician: Referring Pearse Shiffler: Ria Bush Treating Holden Draughon/Extender: Melburn Hake, HOYT Weeks in Treatment: 74 Active Inactive Abuse / Safety / Falls / Self Care Management Nursing Diagnoses: Potential for falls Goals: Patient will not  experience any injury related to falls Date Initiated: 03/23/2018 Target Resolution Date: 06/09/2018 Goal Status: Active Interventions: Assess fall risk on admission and as needed Notes: Orientation to the Wound Care Program Nursing Diagnoses: Knowledge deficit related to the wound healing center program Goals: Patient/caregiver will verbalize understanding of the [facility] Date Initiated: 03/23/2018 Target Resolution Date: 06/09/2018 Goal Status: Active Interventions: Provide education on orientation to the wound center Notes: Pain, Acute or Chronic Nursing Diagnoses: Pain, acute or chronic: actual or potential Goals: Patient will verbalize adequate pain control and receive pain control interventions during procedures as needed Date Initiated: 03/23/2018 Target Resolution Date: 06/09/2018 Goal Status: Active Interventions: Assess comfort goal upon admission Julian, Ceirra C. ([id number]) Notes: Wound/Skin Impairment Nursing Diagnoses: Impaired tissue integrity Goals: Ulcer/skin breakdown will heal within 14 weeks Date Initiated: 03/23/2018 Target Resolution Date: 06/09/2018 Goal Status: Active Interventions: Assess patient/caregiver ability to obtain necessary supplies Assess patient/caregiver ability to perform ulcer/skin care regimen upon admission and as needed Assess ulceration(s) every visit Notes: Electronic Signature(s) Signed: 09/04/2018 5:54:26 PM By: Gretta Cool, BSN, RN, CWS, Kim RN, BSN Entered By: Gretta Cool, BSN, RN, CWS, Kim on 08/30/2018 14:52:27 Sporrer, [name] ([id number]) -------------------------------------------------------------------------------- Pain Assessment Details Patient Name: Jahnke, [place] C. Date of Service: 08/30/2018 2:45 PM Medical Record Number: [id number] Patient Account Number: [id number] Date of Birth/Sex: 1927/08/30 (85 y.o. F) Treating RN: Cornell Barman Primary Care Aayat Hajjar: Ria Bush Other Clinician: Referring  Deandrea Rion: Ria Bush Treating Hannahgrace Lalli/Extender: Melburn Hake, HOYT Weeks in Treatment: 22 Active Problems Location of Pain Severity and Description of Pain Patient Has [name] Yes Site Locations Rate the pain. Current Pain Level: 2 Pain Management and Medication Current Pain Management: Electronic Signature(s) Signed: 09/04/2018 5:54:26 PM By: Gretta Cool, BSN, RN, CWS, Kim RN, BSN Entered By: Gretta Cool, BSN, RN, CWS, Kim on 08/30/2018 14:47:05 Habib, [name] ([id number]) -------------------------------------------------------------------------------- Patient/Caregiver Education Details Patient Name: Forgy, Jeanine C. Date of Service: 08/30/2018 2:45 PM Medical Record Number: [id number] Patient Account Number: [id number] Date of Birth/Gender: 11-27-1927 (85 y.o. F) Treating RN: Cornell Barman Primary Care Physician: Ria Bush Other Clinician: Referring Physician: Ria Bush Treating Physician/Extender: Sharalyn Ink in Treatment: 22 Education Assessment Education Provided To: Patient Education Topics Provided Wound/Skin Impairment: Handouts: Caring for Your Ulcer Methods: Demonstration, Explain/Verbal Responses: State content correctly Electronic Signature(s) Signed: 09/04/2018 5:54:26 PM By: Gretta Cool, BSN, RN, CWS,  Maudie Mercury RN, BSN Entered By: Gretta Cool, BSN, RN, CWS, Kim on 08/30/2018 14:59:32 Lemay, [name] ([id number]) -------------------------------------------------------------------------------- Wound Assessment Details Patient Name: Shafran, Leather C. Date of Service: 08/30/2018 2:45 PM Medical Record Number: [id number] Patient Account Number: [id number] Date of Birth/Sex: 11/27/1927 (85 y.o. F) Treating RN: Cornell Barman Primary Care Shaletha Humble: Ria Bush Other Clinician: Referring Zyan Mirkin: Ria Bush Treating Dyani Babel/Extender: Melburn Hake, HOYT Weeks in Treatment: 22 Wound Status Wound Number: 8 Primary Diabetic Wound/Ulcer of the Lower  Extremity Etiology: Wound Location: Right Lower Leg - Lateral Wound Open Wounding Event: Trauma Status: Date Acquired: 03/09/2018 Comorbid Cataracts, Anemia, Arrhythmia, Coronary Artery Weeks Of Treatment: 22 History: Disease, Hypertension, Myocardial Infarction, Clustered Wound: No Type II Diabetes, Osteoarthritis Photos Wound Measurements Length: (cm) 2 % Reduction in Width: (cm) 0.9 % Reduction in Depth: (cm) 0.1 Epithelializat Area: (cm) 1.414 Tunneling: Volume: (cm) 0.141 Undermining: Area: 25% Volume: 75% ion: Small (1-33%) No No Wound Description Classification: Grade 2 Foul Odor Afte Wound Margin: Flat and Intact Slough/Fibrino Exudate Amount: Medium Exudate Type: Serous Exudate Color: amber r Cleansing: No Yes Wound Bed Granulation Amount: Small (1-33%) Exposed Structure Granulation Quality: Pink Fascia Exposed: No Necrotic Amount: Medium (34-66%) Fat Layer (Subcutaneous Tissue) Exposed: Yes Necrotic Quality: Adherent Slough Tendon Exposed: No Muscle Exposed: No Joint Exposed: No Bone Exposed: No Treatment Notes Suydam, Omar C. ([id number]) Wound #8 (Right, Lateral Lower Leg) Notes Neosporin, gauze, conform secured with tape. Patient refused Tubigrip-states she will not wear because she does not like it! Electronic Signature(s) Signed: 09/04/2018 5:54:26 PM By: Gretta Cool, BSN, RN, CWS, Kim RN, BSN Entered By: Gretta Cool, BSN, RN, CWS, Kim on 08/30/2018 14:51:54 Mctavish, [name] ([id number]) -------------------------------------------------------------------------------- [place] Details Patient Name: Mcpeek, Jourden C. Date of Service: 08/30/2018 2:45 PM Medical Record Number: [id number] Patient Account Number: [id number] Date of Birth/Sex: 1928-03-03 (85 y.o. F) Treating RN: Cornell Barman Primary Care [place] Lasseter: Ria Bush Other Clinician: Referring Gloyd Happ: Ria Bush Treating Adison Jerger/Extender: Melburn Hake, HOYT Weeks in Treatment: 22 Vital  Signs Time Taken: 14:47 Temperature (F): 98.2 Height (in): 58 Pulse (bpm): 62 Weight (lbs): 140 Respiratory Rate (breaths/min): 16 Body Mass Index (BMI): 29.3 Blood Pressure (mmHg): 126/85 Reference Range: 80 - 120 mg / dl Electronic Signature(s) Signed: 09/04/2018 5:54:26 PM By: Gretta Cool, BSN, RN, CWS, Kim RN, BSN Entered By: Gretta Cool, BSN, RN, CWS, Kim on 08/30/2018 14:48:55

## 2018-09-05 NOTE — Progress Notes (Signed)
RINOA, GARRAMONE ([id number]) Visit Report for 08/30/2018 Chief Complaint Document Details Patient Name: [name], [name] Date of Service: 08/30/2018 2:45 PM Medical Record Number: [id number] Patient Account Number: [id number] Date of Birth/Sex: Jun 23, 1927 (85 y.o. F) Treating RN: Cornell Barman Primary Care Provider: Ria Bush Other Clinician: Referring Provider: Ria Bush Treating Provider/Extender: Melburn Hake, Anibal Quinby Weeks in Treatment: 22 Information Obtained from: Patient Chief Complaint RLE wounds Electronic Signature(s) Signed: 08/30/2018 4:36:20 PM By: Worthy Keeler PA-C Entered By: Worthy Keeler on 08/30/2018 14:53:11 Cortinas, [name] ([id number]) -------------------------------------------------------------------------------- HPI Details Patient Name: [name], [name] Date of Service: 08/30/2018 2:45 PM Medical Record Number: [id number] Patient Account Number: [id number] Date of Birth/Sex: [birth year] (85 y.o. F) Treating RN: Cornell Barman Primary Care Provider: Ria Bush Other Clinician: Referring Provider: Ria Bush Treating Provider/Extender: Melburn Hake, Aelyn Stanaland Weeks in Treatment: 22 History of Present Illness HPI Description: Readmission: 03/23/18 on evaluation today patient presents for readmission entire clinic concerning issues that she has been having for the past couple of weeks in regard to her right lower extremity. The medial injury actually occurred when her dog tried to take a blanket from her and subsequently scratched her with his toenail. Subsequently the lateral injury was a wound where she dropped a knife in her kitchen and hit the side of her leg. With that being said both have been open for some time and home health nurse that has been coming out marked for her where the redness was several days ago. Nonetheless this has spread will be on the margin of what was marked. She was placed on Keflex which was started two days ago but at  this point it does not seem to have helped with any improvement in the overall appearance of the wounds. No fevers, chills, nausea, or vomiting noted at this time. 04/02/18 Seen today for follow up and management of right lower extremity wounds. Right lateral wound with adherent slough. She is complaining of pain to touch which limited exam. Recently treated in the ED for cellulitis and wound culture positive for MRSA. Completed a dose of Vancomycin and transitioned to doxycycline by mouth. Erythema remains present with improvement since last visit. Right medial wound with scab present. No fevers, chills, nausea, or vomiting noted at this time. 04/10/18 on evaluation today patient appears to be doing a Bogucki better in regard to her right lateral lower [name]. The slough does seem to be coming off although it is slow from the wound bed. Fortunately there does not appear to be any evidence of infection at this time which is good news. With that being said I do believe that she is going to require longer treatment with the Santyl simply due to the fact that I'm not able to really debride the wound due to the pain that she is experiencing. 04/16/18 upon evaluation today patient's wound bed currently on the lateral lower extremity on the right appears to be doing a Lundberg better at this point. We are utilizing sensible currently. Overall I'm very pleased with how things are progressing although there still is some work to do before this will be completely healed. No fevers, chills, nausea, or vomiting noted at this time. 04/23/18 on evaluation today patient appears to be doing about the same at this point maybe just a Belton bit smaller than last week's evaluation. She continues to have some slough buildup on the surface of the wound and has very Steidle granulation unfortunately. This seems to be very slow moving  I'm getting somewhat concerned about the possibility of her having decreased blood  flow which is leading to her subsequently not being able to heal this area as quickly as she shut otherwise. She is currently utilizing Santyl. 05/01/18 on evaluation today patient appears to be doing somewhat poorly in regard to her lower extremity ulcer. At this point she's been tolerating the dressing changes to some degree without complication. With that being said she does note of the past several days that she has been having more pain unfortunately. On inspection it appears that she may have signs of infection which would account for the pain. 05/07/18 on evaluation today patient's wound actually appears to be doing better she's not having as much erythema in the right lower extremity as compared to last week. Fortunately there's no signs of infection this is good news as well. Overall very pleased with the progress that she has made up to this point. 05/14/18 on evaluation today patient actually appears to be doing rather well in regard to her right lower extremity ulcer. She does not have erythema that was characterizing the cellulitis prior to initiation of antibiotic therapy. With that being said her wound still appears to be somewhat slow in healing I believe this may be arterial in nature that is my concern at least. With that being said I do want her to see the vascular specialist as soon as possible we been trying to get this set up I'm not sure if she's not answering the phone or what exactly is going on here. [name], [name] ([id number]) 05/21/18 on evaluation today patient's wound actually appears to be doing a Hofman better possibly some additional granulation compared to my last evaluation. With that being said she still continues to have a lot of pain especially when her legs are elevated such as sitting in the chair here in the office. There does not appear to be any signs of infection which is good news. 05/28/18 on evaluation today patient appears to be doing a Dipierro better in  regard to her right lateral lower [name]. Again she was supposed to have an appointment with vascular unfortunately they attempted to call her three times and unfortunately were unable to get anything scheduled. That reason closed up for him. She tells me that if the phone is answered and she doesn't recognize what is that she will hang up as well those that she lives with. Obviously she's not gonna recognize somebody calling from the office that she has not previously spoken with. Nonetheless the patient states that she does not remember any fall. I think if we're gonna get her in with vascular specialist we're gonna have to try to make the appointment for her. 06/04/2018 Seen today for follow up and management of right lateral lower extremity wound. Adherent slough present over surface of wound. Scheduled to follow up for ABI/TBI on 06/06/2018 with vein and vascular specialist. She is hard of hearing. Wound is sensitive to touch. No signs or symptoms of infection. Denies any recent fevers, chills, or SOB. 06/11/18 on evaluation today patient actually appears to be doing very well in regard to her right lateral lower extremity ulcer. She's been tolerating the dressing changes without complication. Fortunately there's no evidence of infection. With that being said she did have her arterial studies which revealed that she had a very low ABI which was 0.67 on the right with a TBI of 0.32 and 0.65 on the left with a TBI of 0.16. I  do believe she needs to see vascular and we are working on that referral as well. 06/21/18 on evaluation today patient's wound actually appears to be doing okay she does have an appointment with vascular coming up on the 24th. Nonetheless she's been having some issues that are more urinary in nature right now that are causing trouble for her. Fortunately there's no signs of severe sepsis although she is running a low-grade temperature 99.9 on evaluation today. She also is  having some issues right now with chills as well as urinary urgency and frequency. Again I feel like the city is more urinary and symptomology I do not believe she has anything upper respiratory which is good news. Nonetheless I think this may need to be checked out more quickly especially in light of the fact that she's having significant back pain at this point. 07/02/18 on evaluation today patient appears to be doing okay in regard to her ulcer on the right lower extremity. She's been tolerating the dressing changes without complication. Fortunately there does not appear to be any signs of infection which is good news. No fevers, chills, nausea, or vomiting noted at this time. 07/10/18 on evaluation today patient appears to be doing well in regard to her right lateral lower extremity ulcer all things considering. She's been using this form this seems to be the one thing that she can really do on her own and she seems to be making some progress obviously help with this matter. I think the biggest issue limiting her feeling speed is her blood flow she canceled her original appointment with vascular her next one is not till May. She was worried about the Covid-19 Virus and having to see another physician. Nonetheless there's no signs of active infection at this time. 07/16/18 on evaluation today patient appears to be doing okay in regard to her right lateral lower extremity ulcer. She's been tolerating the dressing changes without complication. Fortunately there's no signs of active infection at this time. No fevers, chills, nausea, or vomiting noted at this time. 07/23/18 on evaluation today patient appears to be doing very well in regard to her right lateral load should be ulcer all things considering. She still again is not seeing vascular until mid May in the meantime she's been utilizing the over-the-counter tripling about appointment which seems to be the only thing she's able to do routinely on  her own without getting confused or frustrated. Overall it seems to be doing okay fortunately she's showing no signs of infection. 07/30/18 upon evaluation today patient actually appears to be doing about the same in regard to her wound in general. She is making very slow progress most likely again due to the issue with her blood flow she sees vascular towards the middle of news. In the meantime we've really been trying to just keep things in the control from the infection standpoint and allow for any healing that could take place up until appointment she can see vascular. 08/06/18 upon evaluation today patient's wound actually appears to be doing about the same although the wound bed may show Keizer bit more granulation which is good news. Fortunately there's no signs of active infection at this time. No fevers, chills, nausea, or vomiting noted at this time. 08/14/18 on evaluation today patient actually appears to be doing about the same in regard to her lower extremity ulcer. Haye, Shavonda C. ([id number]) Fortunately there's no signs of active infection at this time which is good news. Fortunately the patient  has been tolerating the dressing changes without complication. No fevers, chills, nausea, or vomiting noted at this time. She does have her appointment with vascular next week. 08/20/18 on evaluation today patient actually appears to be doing a Faubert better in my opinion with regard to her lower extremity ulcer. She's been tolerating the dressing changes without complication. Fortunately there's no signs of active infection at this time which is good news. She actually will be seen vascular tomorrow. 08/30/18 on evaluation today patient's wound actually appears to be showing some signs of improvement which is good news. Fortunately there's no signs of infection. She does the the vascular doctor on June 4. Electronic Signature(s) Signed: 08/30/2018 4:36:20 PM By: Worthy Keeler PA-C Entered  By: Worthy Keeler on 08/30/2018 15:03:26 Skidgel, [name] ([id number]) -------------------------------------------------------------------------------- Physician Orders Details Patient Name: Gravely, Donye C. Date of Service: 08/30/2018 2:45 PM Medical Record Number: [id number] Patient Account Number: [id number] Date of Birth/Sex: [birth year] (85 y.o. F) Treating RN: Cornell Barman Primary Care Provider: Ria Bush Other Clinician: Referring Provider: Ria Bush Treating Provider/Extender: Melburn Hake, Carie Kapuscinski Weeks in Treatment: 19 Verbal / Phone Orders: No Diagnosis Coding ICD-10 Coding Code Description I87.331 Chronic venous hypertension (idiopathic) with ulcer and inflammation of right lower extremity E11.622 Type 2 diabetes mellitus with other skin ulcer L97.812 Non-pressure chronic ulcer of other part of right lower leg with fat layer exposed L03.115 Cellulitis of right lower limb Wound Cleansing Wound #8 Right,Lateral Lower Leg o Clean wound with Normal Saline. Anesthetic (add to Medication List) Wound #8 Right,Lateral Lower Leg o Topical Lidocaine 4% cream applied to wound bed prior to debridement (In Clinic Only). Primary Wound Dressing Wound #8 Right,Lateral Lower Leg o Other: - Antibiotic ointment Secondary Dressing Wound #8 Right,Lateral Lower Leg o Gauze and Kerlix/Conform Dressing Change Frequency Wound #8 Right,Lateral Lower Leg o Change dressing every day. Follow-up Appointments Wound #8 Right,Lateral Lower Leg o Return Appointment in 1 week. Edema Control Wound #8 Right,Lateral Lower Leg o Elevate legs to the level of the heart and pump ankles as often as possible o Other: - tubigrip Electronic Signature(s) Signed: 08/30/2018 4:36:20 PM By: Worthy Keeler PA-C Signed: 09/04/2018 5:54:26 PM By: Gretta Cool BSN, RN, CWS, Kim RN, BSN Matas, Ulysses ([id number]) Entered By: Gretta Cool, BSN, RN, CWS, Kim on 08/30/2018 14:58:47 Seeney, [name]  ([id number]) -------------------------------------------------------------------------------- Problem List Details Patient Name: Macrae, Shaeleigh C. Date of Service: 08/30/2018 2:45 PM Medical Record Number: [id number] Patient Account Number: [id number] Date of Birth/Sex: 1928/02/05 (85 y.o. F) Treating RN: Cornell Barman Primary Care Provider: Ria Bush Other Clinician: Referring Provider: Ria Bush Treating Provider/Extender: Melburn Hake, Jayleene Glaeser Weeks in Treatment: 22 Active Problems ICD-10 Evaluated Encounter Code Description Active Date Today Diagnosis I87.331 Chronic venous hypertension (idiopathic) with ulcer and 03/23/2018 No Yes inflammation of right lower extremity E11.622 Type 2 diabetes mellitus with other skin ulcer 03/23/2018 No Yes L97.812 Non-pressure chronic ulcer of other part of right lower leg 03/23/2018 No Yes with fat layer exposed L03.115 Cellulitis of right lower limb 03/23/2018 No Yes Inactive Problems Resolved Problems Electronic Signature(s) Signed: 08/30/2018 4:36:20 PM By: Worthy Keeler PA-C Entered By: Worthy Keeler on 08/30/2018 14:53:04 Nistler, [name] ([id number]) -------------------------------------------------------------------------------- ROS/PFSH Details Patient Name: Schlack, Truly C. Date of Service: 08/30/2018 2:45 PM Medical Record Number: [id number] Patient Account Number: [id number] Date of Birth/Sex: 16-Aug-1927 (85 y.o. F) Treating RN: Cornell Barman Primary Care Provider: Ria Bush Other Clinician: Referring Provider: Ria Bush Treating Provider/Extender: STONE III, Trig Mcbryar Weeks in  Treatment: 22 Information Obtained From Patient Constitutional Symptoms (General Health) Complaints and Symptoms: Negative for: Fatigue; Fever; Chills; Marked Weight Change Medical History: Past Medical History Notes: High Blood Pressure, open heart surgery, gall stone, hysterectomy; Fluid pill, Anxiety Respiratory Complaints and  Symptoms: Negative for: Chronic or frequent coughs; Shortness of Breath Medical History: Negative for: Aspiration; Asthma; Chronic Obstructive Pulmonary Disease (COPD); Pneumothorax; Sleep Apnea; Tuberculosis Cardiovascular Complaints and Symptoms: Positive for: LE edema Negative for: Chest pain Medical History: Positive for: Arrhythmia; Coronary Artery Disease; Hypertension; Myocardial Infarction Negative for: Angina; Congestive Heart Failure; Deep Vein Thrombosis; Hypotension; Peripheral Arterial Disease; Peripheral Venous Disease; Phlebitis Psychiatric Complaints and Symptoms: Negative for: Anxiety; Claustrophobia Eyes Medical History: Positive for: Cataracts - removed Negative for: Glaucoma; Optic Neuritis Hematologic/Lymphatic Medical History: Positive for: Anemia Negative for: Hemophilia; Human Immunodeficiency Virus; Lymphedema; Sickle Cell Disease Endocrine HALIEGH, KHURANA ([id number]) Medical History: Positive for: Type II Diabetes Negative for: Type I Diabetes Time with diabetes: no idea Treated with: Oral agents Blood sugar tested every day: No Genitourinary Medical History: Negative for: End Stage Renal Disease Immunological Medical History: Negative for: Lupus Erythematosus; Raynaudos; Scleroderma Integumentary (Skin) Medical History: Negative for: History of Burn; History of pressure wounds Musculoskeletal Medical History: Positive for: Osteoarthritis Negative for: Gout; Rheumatoid Arthritis; Osteomyelitis Neurologic Medical History: Negative for: Dementia; Neuropathy; Quadriplegia; Paraplegia; Seizure Disorder Oncologic Medical History: Negative for: Received Chemotherapy; Received Radiation HBO Extended History Items Eyes: Cataracts Immunizations Pneumococcal Vaccine: Received Pneumococcal Vaccination: Yes Implantable Devices No devices added Family and Social History Cancer: No; Diabetes: Yes - Father,Siblings,Child; Heart Disease: No;  Hypertension: Yes - Child; Kidney Disease: No; Lung Disease: Yes - Child; Seizures: No; Stroke: Yes - Mother; Thyroid Problems: No; Former smoker; Marital Status - Single; Alcohol Use: Never; Drug Use: No History; Caffeine Use: Daily; Financial Concerns: No; Food, Clothing or Shelter Needs: No; Support System Lacking: No; Transportation Concerns: No Physician Affirmation I have reviewed and agree with the above information. JAZMA, PICKEL ([id number]) Electronic Signature(s) Signed: 08/30/2018 4:36:20 PM By: Worthy Keeler PA-C Signed: 09/04/2018 5:54:26 PM By: Gretta Cool BSN, RN, CWS, Kim RN, BSN Entered By: Worthy Keeler on 08/30/2018 15:03:56

## 2018-09-06 ENCOUNTER — Ambulatory Visit
Admission: RE | Admit: 2018-09-06 | Discharge: 2018-09-06 | Disposition: A | Discharge status: Home / Self Care | Payer: Medicare Other | Attending: Vascular Surgery | Admitting: Vascular Surgery

## 2018-09-06 ENCOUNTER — Telehealth (INDEPENDENT_AMBULATORY_CARE_PROVIDER_SITE_OTHER): Payer: Self-pay | Admitting: Vascular Surgery

## 2018-09-06 ENCOUNTER — Encounter
Admission: RE | Disposition: A | Discharge status: Home / Self Care | Payer: Self-pay | Source: Home / Self Care | Attending: Vascular Surgery

## 2018-09-06 ENCOUNTER — Other Ambulatory Visit: Payer: Self-pay

## 2018-09-06 DIAGNOSIS — L97211 Non-pressure chronic ulcer of right calf limited to breakdown of skin: Secondary | ICD-10-CM | POA: Insufficient documentation

## 2018-09-06 DIAGNOSIS — Z951 Presence of aortocoronary bypass graft: Secondary | ICD-10-CM | POA: Insufficient documentation

## 2018-09-06 DIAGNOSIS — I110 Hypertensive heart disease with heart failure: Secondary | ICD-10-CM | POA: Diagnosis not present

## 2018-09-06 DIAGNOSIS — Z7982 Long term (current) use of aspirin: Secondary | ICD-10-CM | POA: Insufficient documentation

## 2018-09-06 DIAGNOSIS — Z833 Family history of diabetes mellitus: Secondary | ICD-10-CM | POA: Insufficient documentation

## 2018-09-06 DIAGNOSIS — E11621 Type 2 diabetes mellitus with foot ulcer: Secondary | ICD-10-CM | POA: Insufficient documentation

## 2018-09-06 DIAGNOSIS — I5030 Unspecified diastolic (congestive) heart failure: Secondary | ICD-10-CM | POA: Insufficient documentation

## 2018-09-06 DIAGNOSIS — I70299 Other atherosclerosis of native arteries of extremities, unspecified extremity: Secondary | ICD-10-CM

## 2018-09-06 DIAGNOSIS — I70239 Atherosclerosis of native arteries of right leg with ulceration of unspecified site: Secondary | ICD-10-CM | POA: Diagnosis not present

## 2018-09-06 DIAGNOSIS — I83212 Varicose veins of right lower extremity with both ulcer of calf and inflammation: Secondary | ICD-10-CM | POA: Diagnosis not present

## 2018-09-06 DIAGNOSIS — Z8249 Family history of ischemic heart disease and other diseases of the circulatory system: Secondary | ICD-10-CM | POA: Insufficient documentation

## 2018-09-06 DIAGNOSIS — Z1159 Encounter for screening for other viral diseases: Secondary | ICD-10-CM | POA: Insufficient documentation

## 2018-09-06 DIAGNOSIS — L97909 Non-pressure chronic ulcer of unspecified part of unspecified lower leg with unspecified severity: Secondary | ICD-10-CM

## 2018-09-06 DIAGNOSIS — Z79899 Other long term (current) drug therapy: Secondary | ICD-10-CM | POA: Insufficient documentation

## 2018-09-06 DIAGNOSIS — Z90722 Acquired absence of ovaries, bilateral: Secondary | ICD-10-CM | POA: Insufficient documentation

## 2018-09-06 DIAGNOSIS — E1151 Type 2 diabetes mellitus with diabetic peripheral angiopathy without gangrene: Secondary | ICD-10-CM | POA: Insufficient documentation

## 2018-09-06 DIAGNOSIS — K589 Irritable bowel syndrome without diarrhea: Secondary | ICD-10-CM | POA: Insufficient documentation

## 2018-09-06 DIAGNOSIS — Z87891 Personal history of nicotine dependence: Secondary | ICD-10-CM | POA: Insufficient documentation

## 2018-09-06 DIAGNOSIS — Z823 Family history of stroke: Secondary | ICD-10-CM | POA: Insufficient documentation

## 2018-09-06 DIAGNOSIS — L97919 Non-pressure chronic ulcer of unspecified part of right lower leg with unspecified severity: Secondary | ICD-10-CM | POA: Diagnosis not present

## 2018-09-06 DIAGNOSIS — Z90710 Acquired absence of both cervix and uterus: Secondary | ICD-10-CM | POA: Insufficient documentation

## 2018-09-06 DIAGNOSIS — I70238 Atherosclerosis of native arteries of right leg with ulceration of other part of lower right leg: Secondary | ICD-10-CM | POA: Insufficient documentation

## 2018-09-06 DIAGNOSIS — Z888 Allergy status to other drugs, medicaments and biological substances status: Secondary | ICD-10-CM | POA: Insufficient documentation

## 2018-09-06 DIAGNOSIS — E785 Hyperlipidemia, unspecified: Secondary | ICD-10-CM | POA: Insufficient documentation

## 2018-09-06 HISTORY — PX: LOWER EXTREMITY ANGIOGRAPHY: CATH118251

## 2018-09-06 HISTORY — PX: POPLITEAL ARTERY STENT: SHX2243

## 2018-09-06 HISTORY — PX: ANGIOPLASTY: SHX39

## 2018-09-06 HISTORY — PX: FEMORAL ARTERY STENT: SHX1583

## 2018-09-06 LAB — GLUCOSE, CAPILLARY
Glucose-Capillary: 107 mg/dL — ABNORMAL HIGH (ref 70–99)
Glucose-Capillary: 127 mg/dL — ABNORMAL HIGH (ref 70–99)

## 2018-09-06 SURGERY — LOWER EXTREMITY ANGIOGRAPHY
Anesthesia: Moderate Sedation | Site: Leg Lower | Laterality: Right

## 2018-09-06 MED ORDER — HEPARIN SODIUM (PORCINE) 1000 UNIT/ML IJ SOLN
INTRAMUSCULAR | Status: DC | PRN
  Administered 2018-09-06: 5000 [IU] via INTRAVENOUS

## 2018-09-06 MED ORDER — CEFAZOLIN SODIUM-DEXTROSE 2-4 GM/100ML-% IV SOLN
INTRAVENOUS | Status: AC
  Filled 2018-09-06: qty 100

## 2018-09-06 MED ORDER — DIPHENHYDRAMINE HCL 50 MG/ML IJ SOLN: 50.0000 mg | Freq: Once | INTRAMUSCULAR | Status: DC | PRN

## 2018-09-06 MED ORDER — FENTANYL CITRATE (PF) 100 MCG/2ML IJ SOLN
INTRAMUSCULAR | Status: AC
  Filled 2018-09-06: qty 4

## 2018-09-06 MED ORDER — SODIUM CHLORIDE 0.9 % IV SOLN
INTRAVENOUS | Status: DC
  Administered 2018-09-06: 09:00:00 via INTRAVENOUS

## 2018-09-06 MED ORDER — MIDAZOLAM HCL 2 MG/ML PO SYRP: 8.0000 mg | ORAL_SOLUTION | Freq: Once | ORAL | Status: DC | PRN

## 2018-09-06 MED ORDER — METHYLPREDNISOLONE SODIUM SUCC 125 MG IJ SOLR: 125.0000 mg | Freq: Once | INTRAMUSCULAR | Status: DC | PRN

## 2018-09-06 MED ORDER — HYDROMORPHONE HCL 1 MG/ML IJ SOLN: 1.0000 mg | Freq: Once | INTRAMUSCULAR | Status: DC | PRN

## 2018-09-06 MED ORDER — MIDAZOLAM HCL 5 MG/5ML IJ SOLN
INTRAMUSCULAR | Status: AC
  Filled 2018-09-06: qty 10

## 2018-09-06 MED ORDER — HEPARIN SODIUM (PORCINE) 1000 UNIT/ML IJ SOLN
INTRAMUSCULAR | Status: AC
  Filled 2018-09-06: qty 1

## 2018-09-06 MED ORDER — MIDAZOLAM HCL 2 MG/2ML IJ SOLN
INTRAMUSCULAR | Status: DC | PRN
  Administered 2018-09-06: 2 mg via INTRAVENOUS
  Administered 2018-09-06 (×2): 1 mg via INTRAVENOUS

## 2018-09-06 MED ORDER — FAMOTIDINE 20 MG PO TABS: 40.0000 mg | ORAL_TABLET | Freq: Once | ORAL | Status: DC | PRN

## 2018-09-06 MED ORDER — FENTANYL CITRATE (PF) 100 MCG/2ML IJ SOLN
INTRAMUSCULAR | Status: DC | PRN
  Administered 2018-09-06: 25 ug via INTRAVENOUS
  Administered 2018-09-06: 50 ug via INTRAVENOUS
  Administered 2018-09-06: 25 ug via INTRAVENOUS

## 2018-09-06 MED ORDER — LIDOCAINE-EPINEPHRINE (PF) 1 %-1:200000 IJ SOLN
INTRAMUSCULAR | Status: AC
  Filled 2018-09-06: qty 30

## 2018-09-06 MED ORDER — IOHEXOL 300 MG/ML  SOLN
INTRAMUSCULAR | Status: DC | PRN
  Administered 2018-09-06: 105 mL via INTRA_ARTERIAL

## 2018-09-06 MED ORDER — ONDANSETRON HCL 4 MG/2ML IJ SOLN: 4.0000 mg | Freq: Four times a day (QID) | INTRAMUSCULAR | Status: DC | PRN

## 2018-09-06 SURGICAL SUPPLY — 24 items
BALLN LUTONIX 018 4X100X130 (BALLOONS) ×3
BALLN LUTONIX 018 5X220X130 (BALLOONS) ×3
BALLN ULTRVRSE 2.5X220X150 (BALLOONS) ×3
BALLN ULTRVRSE 2X300X150 (BALLOONS) ×3
BALLN ULTRVRSE 2X300X150 OTW (BALLOONS) ×1
BALLOON LUTONIX 018 4X100X130 (BALLOONS) IMPLANT
BALLOON LUTONIX 018 5X220X130 (BALLOONS) IMPLANT
BALLOON ULTRVRSE 2.5X220X150 (BALLOONS) IMPLANT
BALLOON ULTRVRSE 2X300X150 OTW (BALLOONS) IMPLANT
CATH BEACON 5 .038 100 VERT TP (CATHETERS) ×2 IMPLANT
CATH CXI SUPP ANG 4FR 135 (CATHETERS) IMPLANT
CATH CXI SUPP ANG 4FR 135CM (CATHETERS) ×3
CATH PIG 70CM (CATHETERS) ×2 IMPLANT
CATH SEEKER .018X150 (CATHETERS) ×2 IMPLANT
DEVICE PRESTO INFLATION (MISCELLANEOUS) ×2 IMPLANT
DEVICE STARCLOSE SE CLOSURE (Vascular Products) ×2 IMPLANT
GLIDEWIRE ADV .035X260CM (WIRE) ×2 IMPLANT
PACK ANGIOGRAPHY (CUSTOM PROCEDURE TRAY) ×3 IMPLANT
SHEATH ANL2 6FRX45 HC (SHEATH) ×2 IMPLANT
SHEATH BRITE TIP 5FRX11 (SHEATH) ×2 IMPLANT
STENT VIABAHN 6X250X120 (Permanent Stent) ×2 IMPLANT
TUBING CONTRAST HIGH PRESS 72 (TUBING) ×2 IMPLANT
WIRE G V18X300CM (WIRE) ×2 IMPLANT
WIRE J 3MM .035X145CM (WIRE) ×2 IMPLANT

## 2018-09-06 NOTE — Telephone Encounter (Signed)
I spoke with Shirlean Mylar and the patient appointment has been schedule and for the procedure that need to schedule Dr Lucky Cowboy will contact Mickel Baas

## 2018-09-06 NOTE — Op Note (Signed)
[facility]  Percutaneous Study/Intervention Procedural Note   Date of Surgery: 09/06/2018  Surgeon(s):Stephanie Littman    Assistants:none  Pre-operative Diagnosis: PAD with ulceration right lower extremity  Post-operative diagnosis:  Same  Procedure(s) Performed:             1.  Ultrasound guidance for vascular access left femoral artery             2.  Catheter placement into right common femoral artery from left femoral approach             3.  Aortogram and selective right lower extremity angiogram             4.  Percutaneous transluminal angioplasty of the peroneal artery with a 2 mm diameter angioplasty balloon throughout and a 3 mm diameter angioplasty balloon proximally             5.   Percutaneous transluminal angioplasty of right SFA with 5 mm diameter by 22 cm length Lutonix drug-coated angioplasty balloon and of the popliteal artery with a 4 mm diameter by 15 cm length Lutonix drug-coated angioplasty balloon  6.  Viabahn stent placement to the right SFA and above-knee popliteal artery with 6 mm diameter by 25 cm length stent for greater than 50% residual stenosis after angioplasty in multiple locations             7.  StarClose closure device left femoral artery  EBL: 10 cc  Contrast: 105 cc  Fluoro Time: 14 minutes  Moderate Conscious Sedation Time: approximately 45 minutes using 4 mg of Versed and 100 mcg of Fentanyl              Indications:  Patient is a 85 y.o.female with non-healing ulceration on the right leg and ABIs in the 0.6 range bilaterally. The patient is brought in for angiography for further evaluation and potential treatment.  Due to the limb threatening nature of the situation, angiogram was performed for attempted limb salvage. The patient is aware that if the procedure fails, amputation would be expected.  The patient also understands that even with successful revascularization, amputation may still be required due to the severity of  the situation.  Risks and benefits are discussed and informed consent is obtained.   Procedure:  The patient was identified and appropriate procedural time out was performed.  The patient was then placed supine on the table and prepped and draped in the usual sterile fashion. Moderate conscious sedation was administered during a face to face encounter with the patient throughout the procedure with my supervision of the RN administering medicines and monitoring the patient's vital signs, pulse oximetry, telemetry and mental status throughout from the start of the procedure until the patient was taken to the recovery room. Ultrasound was used to evaluate the left common femoral artery.  It was patent .  A digital ultrasound image was acquired.  A Seldinger needle was used to access the left common femoral artery under direct ultrasound guidance and a permanent image was performed.  A 0.035 J wire was advanced without resistance and a 5Fr sheath was placed.  Pigtail catheter was placed into the aorta and an AP aortogram was performed. This demonstrated normal renal arteries and normal aorta and iliac segments without significant stenosis. I then crossed the aortic bifurcation and advanced to the right femoral head. Selective right lower extremity angiogram was then performed. This demonstrated that the common femoral artery and profunda femoris artery had pretty  good flow.  The SFA was patent proximally but occluded in the proximal to mid segment with reconstitution at Hunter's canal.  The above-knee popliteal artery then had a high-grade stenosis below the reconstitution.  The below-knee popliteal artery normalized in the anterior tibial artery was continuous to the foot.  The peroneal artery had a medium segment occlusion in the proximal to mid segment but did reconstitute distally.  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 It was felt that it was in the patient's  best interest to proceed with intervention after these images to avoid a second procedure and a larger amount of contrast and fluoroscopy based off of the findings from the initial angiogram. The patient was systemically heparinized and a 6 [place] Ansell sheath was then placed over the [employer] wire. I then used a Kumpe catheter and the advantage wire to navigate through the length the SFA occlusion and get down into the below-knee popliteal artery.  I then exchanged for a V 18 wire and first a CXI catheter and then a seeker catheter to cross the peroneal artery occlusion and confirm intraluminal flow in the peroneal artery just above the ankle.  The diagnostic catheter was then removed and a V 18 wire was replaced.  We proceeded with treatment.  A 2 mm diameter by 30 cm length angioplasty balloon was then inflated from the distal peroneal artery up through the tibioperoneal trunk taken to 12 atm for 1 minute.  There remained high-grade residual stenosis in the proximal peroneal artery and a 3 mm diameter by 15 cm length angioplasty balloon was then inflated to 8 atm for 1 minute.  Completion imaging showed brisk flow through the peroneal artery with less than 30% residual stenosis and maintained flow through the anterior tibial artery.  The SFA was treated with a 5 mm diameter by 22 cm length Lutonix drug-coated angioplasty balloon inflated to 10 atm for 1 minute.  The popliteal artery was treated with a 4 mm diameter by 15 cm length Lutonix drug-coated angioplasty balloon inflated to 12 atm for 1 minute.  Completion imaging showed greater than 50% residual stenosis in the above-knee popliteal artery and the mid to distal SFA.  Rather than placed to shorter stents with a short gap between the 2, I elected to place a single 6 mm diameter by 25 cm length Viabahn stent to encompass both lesions.  This was then postdilated with a 5 mm balloon with excellent angiographic completion result and less than 10%  residual stenosis. I elected to terminate the procedure. The sheath was removed and StarClose closure device was deployed in the left femoral artery with excellent hemostatic result. The patient was taken to the recovery room in stable condition having tolerated the procedure well.  Findings:               Aortogram:  Highly tortuous aorta and iliac arteries without focal stenosis but significant calcification.  Renal arteries appear to have good flow.             Right Lower Extremity:  The common femoral artery and profunda femoris artery had pretty good flow.  The SFA was patent proximally but occluded in the proximal to mid segment with reconstitution at Hunter's canal.  The above-knee popliteal artery then had a high-grade stenosis below the reconstitution.  The below-knee popliteal artery normalized in the anterior tibial artery was continuous to the foot.  The peroneal artery had a medium segment occlusion in  the proximal to mid segment but did reconstitute distally.  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   Disposition: Patient was taken to the recovery room in stable condition having tolerated the procedure well.  Complications: None  Leotis Pain 09/06/2018 11:34 AM   This note was created with Dragon Medical transcription system. Any errors in dictation are purely unintentional.

## 2018-09-06 NOTE — H&P (Signed)
[facility] History & Physical Update  The patient was interviewed and re-examined.  The patient's previous History and Physical has been reviewed and is unchanged.  There is no change in the plan of care. We plan to proceed with the scheduled procedure.  Leotis Pain, MD  09/06/2018, 8:07 AM

## 2018-09-07 ENCOUNTER — Encounter: Payer: Self-pay | Admitting: Vascular Surgery

## 2018-09-10 ENCOUNTER — Other Ambulatory Visit: Payer: Self-pay | Admitting: Family Medicine

## 2018-09-10 ENCOUNTER — Encounter: Payer: Medicare Other | Attending: Physician Assistant | Admitting: Physician Assistant

## 2018-09-10 ENCOUNTER — Other Ambulatory Visit: Payer: Self-pay

## 2018-09-10 DIAGNOSIS — L03115 Cellulitis of right lower limb: Secondary | ICD-10-CM | POA: Diagnosis not present

## 2018-09-10 DIAGNOSIS — I252 Old myocardial infarction: Secondary | ICD-10-CM | POA: Diagnosis not present

## 2018-09-10 DIAGNOSIS — Z90710 Acquired absence of both cervix and uterus: Secondary | ICD-10-CM | POA: Diagnosis not present

## 2018-09-10 DIAGNOSIS — Z87891 Personal history of nicotine dependence: Secondary | ICD-10-CM | POA: Diagnosis not present

## 2018-09-10 DIAGNOSIS — I10 Essential (primary) hypertension: Secondary | ICD-10-CM | POA: Insufficient documentation

## 2018-09-10 DIAGNOSIS — Z8249 Family history of ischemic heart disease and other diseases of the circulatory system: Secondary | ICD-10-CM | POA: Insufficient documentation

## 2018-09-10 DIAGNOSIS — E11622 Type 2 diabetes mellitus with other skin ulcer: Secondary | ICD-10-CM | POA: Insufficient documentation

## 2018-09-10 DIAGNOSIS — E11621 Type 2 diabetes mellitus with foot ulcer: Secondary | ICD-10-CM | POA: Insufficient documentation

## 2018-09-10 DIAGNOSIS — L97812 Non-pressure chronic ulcer of other part of right lower leg with fat layer exposed: Secondary | ICD-10-CM | POA: Insufficient documentation

## 2018-09-10 DIAGNOSIS — I2510 Atherosclerotic heart disease of native coronary artery without angina pectoris: Secondary | ICD-10-CM | POA: Insufficient documentation

## 2018-09-10 DIAGNOSIS — F419 Anxiety disorder, unspecified: Secondary | ICD-10-CM | POA: Insufficient documentation

## 2018-09-10 DIAGNOSIS — M7989 Other specified soft tissue disorders: Secondary | ICD-10-CM | POA: Diagnosis not present

## 2018-09-10 DIAGNOSIS — M1990 Unspecified osteoarthritis, unspecified site: Secondary | ICD-10-CM | POA: Insufficient documentation

## 2018-09-10 DIAGNOSIS — S81801A Unspecified open wound, right lower leg, initial encounter: Secondary | ICD-10-CM | POA: Diagnosis not present

## 2018-09-10 NOTE — Progress Notes (Signed)
AZIZAH, LISLE ([id number]) Visit Report for 09/10/2018 Chief Complaint Document Details Patient Name: [name], [name] Date of Service: 09/10/2018 12:30 PM Medical Record Number: [id number] Patient Account Number: [id number] Date of Birth/Sex: [birth year] (85 y.o. F) Treating RN: Montey Hora Primary Care Provider: Ria Bush Other Clinician: Referring Provider: Ria Bush Treating Provider/Extender: Melburn Hake, Daymien Goth Weeks in Treatment: 24 Information Obtained from: Patient Chief Complaint RLE wounds Electronic Signature(s) Signed: 09/10/2018 4:26:55 PM By: Worthy Keeler PA-C Entered By: Worthy Keeler on 09/10/2018 12:55:59 Magos, [name] ([id number]) -------------------------------------------------------------------------------- HPI Details Patient Name: [name], [name] Date of Service: 09/10/2018 12:30 PM Medical Record Number: [id number] Patient Account Number: [id number] Date of Birth/Sex: 1927-11-20 (85 y.o. F) Treating RN: Montey Hora Primary Care Provider: Ria Bush Other Clinician: Referring Provider: Ria Bush Treating Provider/Extender: Melburn Hake, Raney Koeppen Weeks in Treatment: 24 History of Present Illness HPI Description: Readmission: 03/23/18 on evaluation today patient presents for readmission entire clinic concerning issues that she has been having for the past couple of weeks in regard to her right lower extremity. The medial injury actually occurred when her dog tried to take a blanket from her and subsequently scratched her with his toenail. Subsequently the lateral injury was a wound where she dropped a knife in her kitchen and hit the side of her leg. With that being said both have been open for some time and home health nurse that has been coming out marked for her where the redness was several days ago. Nonetheless this has spread will be on the margin of what was marked. She was placed on Keflex which was started two days ago  but at this point it does not seem to have helped with any improvement in the overall appearance of the wounds. No fevers, chills, nausea, or vomiting noted at this time. 04/02/18 Seen today for follow up and management of right lower extremity wounds. Right lateral wound with adherent slough. She is complaining of pain to touch which limited exam. Recently treated in the ED for cellulitis and wound culture positive for MRSA. Completed a dose of Vancomycin and transitioned to doxycycline by mouth. Erythema remains present with improvement since last visit. Right medial wound with scab present. No fevers, chills, nausea, or vomiting noted at this time. 04/10/18 on evaluation today patient appears to be doing a Valin better in regard to her right lateral lower [name]. The slough does seem to be coming off although it is slow from the wound bed. Fortunately there does not appear to be any evidence of infection at this time which is good news. With that being said I do believe that she is going to require longer treatment with the Santyl simply due to the fact that I'm not able to really debride the wound due to the pain that she is experiencing. 04/16/18 upon evaluation today patient's wound bed currently on the lateral lower extremity on the right appears to be doing a Piggott better at this point. We are utilizing sensible currently. Overall I'm very pleased with how things are progressing although there still is some work to do before this will be completely healed. No fevers, chills, nausea, or vomiting noted at this time. 04/23/18 on evaluation today patient appears to be doing about the same at this point maybe just a Kamphaus bit smaller than last week's evaluation. She continues to have some slough buildup on the surface of the wound and has very Haddix granulation unfortunately. This seems to be very slow moving  I'm getting somewhat concerned about the possibility of her having decreased  blood flow which is leading to her subsequently not being able to heal this area as quickly as she shut otherwise. She is currently utilizing Santyl. 05/01/18 on evaluation today patient appears to be doing somewhat poorly in regard to her lower extremity ulcer. At this point she's been tolerating the dressing changes to some degree without complication. With that being said she does note of the past several days that she has been having more pain unfortunately. On inspection it appears that she may have signs of infection which would account for the pain. 05/07/18 on evaluation today patient's wound actually appears to be doing better she's not having as much erythema in the right lower extremity as compared to last week. Fortunately there's no signs of infection this is good news as well. Overall very pleased with the progress that she has made up to this point. 05/14/18 on evaluation today patient actually appears to be doing rather well in regard to her right lower extremity ulcer. She does not have erythema that was characterizing the cellulitis prior to initiation of antibiotic therapy. With that being said her wound still appears to be somewhat slow in healing I believe this may be arterial in nature that is my concern at least. With that being said I do want her to see the vascular specialist as soon as possible we been trying to get this set up I'm not sure if she's not answering the phone or what exactly is going on here. [name], [name] ([id number]) 05/21/18 on evaluation today patient's wound actually appears to be doing a Lwin better possibly some additional granulation compared to my last evaluation. With that being said she still continues to have a lot of pain especially when her legs are elevated such as sitting in the chair here in the office. There does not appear to be any signs of infection which is good news. 05/28/18 on evaluation today patient appears to be doing a Khiev  better in regard to her right lateral lower [name]. Again she was supposed to have an appointment with vascular unfortunately they attempted to call her three times and unfortunately were unable to get anything scheduled. That reason closed up for him. She tells me that if the phone is answered and she doesn't recognize what is that she will hang up as well those that she lives with. Obviously she's not gonna recognize somebody calling from the office that she has not previously spoken with. Nonetheless the patient states that she does not remember any fall. I think if we're gonna get her in with vascular specialist we're gonna have to try to make the appointment for her. 06/04/2018 Seen today for follow up and management of right lateral lower extremity wound. Adherent slough present over surface of wound. Scheduled to follow up for ABI/TBI on 06/06/2018 with vein and vascular specialist. She is hard of hearing. Wound is sensitive to touch. No signs or symptoms of infection. Denies any recent fevers, chills, or SOB. 06/11/18 on evaluation today patient actually appears to be doing very well in regard to her right lateral lower extremity ulcer. She's been tolerating the dressing changes without complication. Fortunately there's no evidence of infection. With that being said she did have her arterial studies which revealed that she had a very low ABI which was 0.67 on the right with a TBI of 0.32 and 0.65 on the left with a TBI of 0.16. I  do believe she needs to see vascular and we are working on that referral as well. 06/21/18 on evaluation today patient's wound actually appears to be doing okay she does have an appointment with vascular coming up on the 24th. Nonetheless she's been having some issues that are more urinary in nature right now that are causing trouble for her. Fortunately there's no signs of severe sepsis although she is running a low-grade temperature 99.9 on evaluation today. She  also is having some issues right now with chills as well as urinary urgency and frequency. Again I feel like the city is more urinary and symptomology I do not believe she has anything upper respiratory which is good news. Nonetheless I think this may need to be checked out more quickly especially in light of the fact that she's having significant back pain at this point. 07/02/18 on evaluation today patient appears to be doing okay in regard to her ulcer on the right lower extremity. She's been tolerating the dressing changes without complication. Fortunately there does not appear to be any signs of infection which is good news. No fevers, chills, nausea, or vomiting noted at this time. 07/10/18 on evaluation today patient appears to be doing well in regard to her right lateral lower extremity ulcer all things considering. She's been using this form this seems to be the one thing that she can really do on her own and she seems to be making some progress obviously help with this matter. I think the biggest issue limiting her feeling speed is her blood flow she canceled her original appointment with vascular her next one is not till May. She was worried about the Covid-19 Virus and having to see another physician. Nonetheless there's no signs of active infection at this time. 07/16/18 on evaluation today patient appears to be doing okay in regard to her right lateral lower extremity ulcer. She's been tolerating the dressing changes without complication. Fortunately there's no signs of active infection at this time. No fevers, chills, nausea, or vomiting noted at this time. 07/23/18 on evaluation today patient appears to be doing very well in regard to her right lateral load should be ulcer all things considering. She still again is not seeing vascular until mid May in the meantime she's been utilizing the over-the-counter tripling about appointment which seems to be the only thing she's able to do  routinely on her own without getting confused or frustrated. Overall it seems to be doing okay fortunately she's showing no signs of infection. 07/30/18 upon evaluation today patient actually appears to be doing about the same in regard to her wound in general. She is making very slow progress most likely again due to the issue with her blood flow she sees vascular towards the middle of news. In the meantime we've really been trying to just keep things in the control from the infection standpoint and allow for any healing that could take place up until appointment she can see vascular. 08/06/18 upon evaluation today patient's wound actually appears to be doing about the same although the wound bed may show Schrader bit more granulation which is good news. Fortunately there's no signs of active infection at this time. No fevers, chills, nausea, or vomiting noted at this time. 08/14/18 on evaluation today patient actually appears to be doing about the same in regard to her lower extremity ulcer. [name], [name] ([id number]) Fortunately there's no signs of active infection at this time which is good news. Fortunately the patient  has been tolerating the dressing changes without complication. No fevers, chills, nausea, or vomiting noted at this time. She does have her appointment with vascular next week. 08/20/18 on evaluation today patient actually appears to be doing a Schlatter better in my opinion with regard to her lower extremity ulcer. She's been tolerating the dressing changes without complication. Fortunately there's no signs of active infection at this time which is good news. She actually will be seen vascular tomorrow. 08/30/18 on evaluation today patient's wound actually appears to be showing some signs of improvement which is good news. Fortunately there's no signs of infection. She does the the vascular doctor on June 4. 09/10/18 on evaluation today patient is status post having had an angiogram  with Dr. Lucky Cowboy. Unfortunately she tells me that she still having a lot of discomfort although the wound bed itself seems to be doing better. She does have more swelling of the right lower extremity but nothing too significant. I think this is causing Besson bit of increased pain right now as well that is unfortunate. No fevers, chills, nausea, or vomiting noted at this time. Electronic Signature(s) Signed: 09/10/2018 4:26:55 PM By: Worthy Keeler PA-C Entered By: Worthy Keeler on 09/10/2018 13:45:53 [name], [name] ([id number]) -------------------------------------------------------------------------------- Physical Exam Details Patient Name: Cordle, Bulah C. Date of Service: 09/10/2018 12:30 PM Medical Record Number: [id number] Patient Account Number: [id number] Date of Birth/Sex: 08-02-1927 (85 y.o. F) Treating RN: Montey Hora Primary Care Provider: Ria Bush Other Clinician: Referring Provider: Ria Bush Treating Provider/Extender: STONE III, Ryden Wainer Weeks in Treatment: 24 Constitutional Well-nourished and well-hydrated in no acute distress. Respiratory normal breathing without difficulty. clear to auscultation bilaterally. Cardiovascular regular rate and rhythm with normal S1, S2. 2+ pitting edema of the bilateral lower extremities. Psychiatric this patient is able to make decisions and demonstrates good insight into disease process. Alert and Oriented x 3. pleasant and cooperative. Notes Upon inspection patient's wound bed actually showed signs of better improvement with a fairly good granulation surface noted that appears to be more red than the pale pink that we have been seeing. I think this is directly attributed to the fact that she has better blood flow. Unfortunately the leg is a Hipps bit swollen post procedure at this time. I think this is because a Almquist bit more discomfort for her. Electronic Signature(s) Signed: 09/10/2018 4:26:55 PM By: Worthy Keeler PA-C Entered By: Worthy Keeler on 09/10/2018 13:46:28 Agerton, [name] ([id number]) -------------------------------------------------------------------------------- Physician Orders Details Patient Name: Hardeman, Galina C. Date of Service: 09/10/2018 12:30 PM Medical Record Number: [id number] Patient Account Number: [id number] Date of Birth/Sex: 1927-09-02 (85 y.o. F) Treating RN: Montey Hora Primary Care Provider: Ria Bush Other Clinician: Referring Provider: Ria Bush Treating Provider/Extender: Melburn Hake, Yacine Garriga Weeks in Treatment: 24 Verbal / Phone Orders: No Diagnosis Coding ICD-10 Coding Code Description I87.331 Chronic venous hypertension (idiopathic) with ulcer and inflammation of right lower extremity E11.622 Type 2 diabetes mellitus with other skin ulcer L97.812 Non-pressure chronic ulcer of other part of right lower leg with fat layer exposed L03.115 Cellulitis of right lower limb Wound Cleansing Wound #8 Right,Lateral Lower Leg o Clean wound with Normal Saline. Anesthetic (add to Medication List) Wound #8 Right,Lateral Lower Leg o Topical Lidocaine 4% cream applied to wound bed prior to debridement (In Clinic Only). Primary Wound Dressing Wound #8 Right,Lateral Lower Leg o Silver Alginate Secondary Dressing Wound #8 Right,Lateral Lower Leg o Conform/Kerlix o XtraSorb Dressing Change Frequency Wound #8 Right,Lateral Lower Leg   o Change Dressing Monday, Wednesday, Friday Follow-up Appointments Wound #8 Right,Lateral Lower Leg o Return Appointment in 1 week. Edema Control Wound #8 Right,Lateral Lower Leg o Elevate legs to the level of the heart and pump ankles as often as possible o Other: - [place] Wound #8 [facility] MALAYZIA, LAFORTE ([id number]) o [place] Nurse may visit PRN to address patientos wound care needs. o FACE TO FACE  ENCOUNTER: MEDICARE and MEDICAID PATIENTS: I certify that this patient is under my care and that I had a face-to-face encounter that meets the physician face-to-face encounter requirements with this patient on this date. The encounter with the patient was in whole or in part for the following MEDICAL CONDITION: (primary reason for [place]) MEDICAL NECESSITY: I certify, that based on my findings, NURSING services are a medically necessary home health service. HOME BOUND STATUS: I certify that my clinical findings support that this patient is homebound (i.e., Due to illness or injury, pt requires aid of supportive devices such as crutches, cane, wheelchairs, walkers, the use of special transportation or the assistance of another person to leave their place of residence. There is a normal inability to leave the home and doing so requires considerable and taxing effort. Other absences are for medical reasons / religious services and are infrequent or of short duration when for other reasons). o If current dressing causes regression in wound condition, may D/C ordered dressing product/s and apply Normal Saline Moist Dressing daily until next [place] / Other MD appointment. [place] of regression in wound condition at [phone]. o Please direct any NON-WOUND related issues/requests for orders to patient's Primary Care Physician Electronic Signature(s) Signed: 09/10/2018 4:19:49 PM By: Montey Hora Signed: 09/10/2018 4:26:55 PM By: Worthy Keeler PA-C Entered By: Montey Hora on 09/10/2018 13:14:19 [name], [name] ([id number]) -------------------------------------------------------------------------------- Problem List Details Patient Name: [name], [name] Date of Service: 09/10/2018 12:30 PM Medical Record Number: [id number] Patient Account Number: [id number] Date of Birth/Sex: Jan 01, 1928 (85 y.o. F) Treating RN: Montey Hora Primary Care  Provider: Ria Bush Other Clinician: Referring Provider: Ria Bush Treating Provider/Extender: Melburn Hake, Deshay Kirstein Weeks in Treatment: 24 Active Problems ICD-10 Evaluated Encounter Code Description Active Date Today Diagnosis I87.331 Chronic venous hypertension (idiopathic) with ulcer and 03/23/2018 No Yes inflammation of right lower extremity E11.622 Type 2 diabetes mellitus with other skin ulcer 03/23/2018 No Yes L97.812 Non-pressure chronic ulcer of other part of right lower leg 03/23/2018 No Yes with fat layer exposed L03.115 Cellulitis of right lower limb 03/23/2018 No Yes Inactive Problems Resolved Problems Electronic Signature(s) Signed: 09/10/2018 4:26:55 PM By: Worthy Keeler PA-C Entered By: Worthy Keeler on 09/10/2018 12:55:52 Bieri, [name] ([id number]) -------------------------------------------------------------------------------- Progress Note Details Patient Name: [name], [name] Date of Service: 09/10/2018 12:30 PM Medical Record Number: [id number] Patient Account Number: [id number] Date of Birth/Sex: [birth year] (85 y.o. F) Treating RN: Montey Hora Primary Care Provider: Ria Bush Other Clinician: Referring Provider: Ria Bush Treating Provider/Extender: Melburn Hake, Tamsin Nader Weeks in Treatment: 24 Subjective Chief Complaint Information obtained from Patient RLE wounds History of Present Illness (HPI) Readmission: 03/23/18 on evaluation today patient presents for readmission entire clinic concerning issues that she has been having for the past couple of weeks in regard to her right lower extremity. The medial injury actually occurred when her dog tried to take a blanket from her and subsequently scratched her with his toenail. Subsequently the lateral injury  was a wound where she dropped a knife in her kitchen and hit the side of her leg. With that being said both have been open for some time and home health nurse that has been  coming out marked for her where the redness was several days ago. Nonetheless this has spread will be on the margin of what was marked. She was placed on Keflex which was started two days ago but at this point it does not seem to have helped with any improvement in the overall appearance of the wounds. No fevers, chills, nausea, or vomiting noted at this time. 04/02/18 Seen today for follow up and management of right lower extremity wounds. Right lateral wound with adherent slough. She is complaining of pain to touch which limited exam. Recently treated in the ED for cellulitis and wound culture positive for MRSA. Completed a dose of Vancomycin and transitioned to doxycycline by mouth. Erythema remains present with improvement since last visit. Right medial wound with scab present. No fevers, chills, nausea, or vomiting noted at this time. 04/10/18 on evaluation today patient appears to be doing a Hulet better in regard to her right lateral lower [name]. The slough does seem to be coming off although it is slow from the wound bed. Fortunately there does not appear to be any evidence of infection at this time which is good news. With that being said I do believe that she is going to require longer treatment with the Santyl simply due to the fact that I'm not able to really debride the wound due to the pain that she is experiencing. 04/16/18 upon evaluation today patient's wound bed currently on the lateral lower extremity on the right appears to be doing a Lisowski better at this point. We are utilizing sensible currently. Overall I'm very pleased with how things are progressing although there still is some work to do before this will be completely healed. No fevers, chills, nausea, or vomiting noted at this time. 04/23/18 on evaluation today patient appears to be doing about the same at this point maybe just a Sky bit smaller than last week's evaluation. She continues to have some slough  buildup on the surface of the wound and has very Meester granulation unfortunately. This seems to be very slow moving I'm getting somewhat concerned about the possibility of her having decreased blood flow which is leading to her subsequently not being able to heal this area as quickly as she shut otherwise. She is currently utilizing Santyl. 05/01/18 on evaluation today patient appears to be doing somewhat poorly in regard to her lower extremity ulcer. At this point she's been tolerating the dressing changes to some degree without complication. With that being said she does note of the past several days that she has been having more pain unfortunately. On inspection it appears that she may have signs of infection which would account for the pain. 05/07/18 on evaluation today patient's wound actually appears to be doing better she's not having as much erythema in the right lower extremity as compared to last week. Fortunately there's no signs of infection this is good news as well. Overall very pleased with the progress that she has made up to this point. JENNIAH, BHAVSAR ([id number]) 05/14/18 on evaluation today patient actually appears to be doing rather well in regard to her right lower extremity ulcer. She does not have erythema that was characterizing the cellulitis prior to initiation of antibiotic therapy. With that being said her wound still appears  to be somewhat slow in healing I believe this may be arterial in nature that is my concern at least. With that being said I do want her to see the vascular specialist as soon as possible we been trying to get this set up I'm not sure if she's not answering the phone or what exactly is going on here. 05/21/18 on evaluation today patient's wound actually appears to be doing a Scheiber better possibly some additional granulation compared to my last evaluation. With that being said she still continues to have a lot of pain especially when her legs  are elevated such as sitting in the chair here in the office. There does not appear to be any signs of infection which is good news. 05/28/18 on evaluation today patient appears to be doing a Gilbertson better in regard to her right lateral lower [name]. Again she was supposed to have an appointment with vascular unfortunately they attempted to call her three times and unfortunately were unable to get anything scheduled. That reason closed up for him. She tells me that if the phone is answered and she doesn't recognize what is that she will hang up as well those that she lives with. Obviously she's not gonna recognize somebody calling from the office that she has not previously spoken with. Nonetheless the patient states that she does not remember any fall. I think if we're gonna get her in with vascular specialist we're gonna have to try to make the appointment for her. 06/04/2018 Seen today for follow up and management of right lateral lower extremity wound. Adherent slough present over surface of wound. Scheduled to follow up for ABI/TBI on 06/06/2018 with vein and vascular specialist. She is hard of hearing. Wound is sensitive to touch. No signs or symptoms of infection. Denies any recent fevers, chills, or SOB. 06/11/18 on evaluation today patient actually appears to be doing very well in regard to her right lateral lower extremity ulcer. She's been tolerating the dressing changes without complication. Fortunately there's no evidence of infection. With that being said she did have her arterial studies which revealed that she had a very low ABI which was 0.67 on the right with a TBI of 0.32 and 0.65 on the left with a TBI of 0.16. I do believe she needs to see vascular and we are working on that referral as well. 06/21/18 on evaluation today patient's wound actually appears to be doing okay she does have an appointment with vascular coming up on the 24th. Nonetheless she's been having some issues  that are more urinary in nature right now that are causing trouble for her. Fortunately there's no signs of severe sepsis although she is running a low-grade temperature 99.9 on evaluation today. She also is having some issues right now with chills as well as urinary urgency and frequency. Again I feel like the city is more urinary and symptomology I do not believe she has anything upper respiratory which is good news. Nonetheless I think this may need to be checked out more quickly especially in light of the fact that she's having significant back pain at this point. 07/02/18 on evaluation today patient appears to be doing okay in regard to her ulcer on the right lower extremity. She's been tolerating the dressing changes without complication. Fortunately there does not appear to be any signs of infection which is good news. No fevers, chills, nausea, or vomiting noted at this time. 07/10/18 on evaluation today patient appears to be doing  well in regard to her right lateral lower extremity ulcer all things considering. She's been using this form this seems to be the one thing that she can really do on her own and she seems to be making some progress obviously help with this matter. I think the biggest issue limiting her feeling speed is her blood flow she canceled her original appointment with vascular her next one is not till May. She was worried about the Covid-19 Virus and having to see another physician. Nonetheless there's no signs of active infection at this time. 07/16/18 on evaluation today patient appears to be doing okay in regard to her right lateral lower extremity ulcer. She's been tolerating the dressing changes without complication. Fortunately there's no signs of active infection at this time. No fevers, chills, nausea, or vomiting noted at this time. 07/23/18 on evaluation today patient appears to be doing very well in regard to her right lateral load should be ulcer all  things considering. She still again is not seeing vascular until mid May in the meantime she's been utilizing the over-the-counter tripling about appointment which seems to be the only thing she's able to do routinely on her own without getting confused or frustrated. Overall it seems to be doing okay fortunately she's showing no signs of infection. 07/30/18 upon evaluation today patient actually appears to be doing about the same in regard to her wound in general. She is making very slow progress most likely again due to the issue with her blood flow she sees vascular towards the middle of news. In the meantime we've really been trying to just keep things in the control from the infection standpoint and allow for any healing that could take place up until appointment she can see vascular. [name], [name] ([id number]) 08/06/18 upon evaluation today patient's wound actually appears to be doing about the same although the wound bed may show Row bit more granulation which is good news. Fortunately there's no signs of active infection at this time. No fevers, chills, nausea, or vomiting noted at this time. 08/14/18 on evaluation today patient actually appears to be doing about the same in regard to her lower extremity ulcer. Fortunately there's no signs of active infection at this time which is good news. Fortunately the patient has been tolerating the dressing changes without complication. No fevers, chills, nausea, or vomiting noted at this time. She does have her appointment with vascular next week. 08/20/18 on evaluation today patient actually appears to be doing a [place] better in my opinion with regard to her lower extremity ulcer. She's been tolerating the dressing changes without complication. Fortunately there's no signs of active infection at this time which is good news. She actually will be seen vascular tomorrow. 08/30/18 on evaluation today patient's wound actually appears to be showing  some signs of improvement which is good news. Fortunately there's no signs of infection. She does the the vascular doctor on June 4. 09/10/18 on evaluation today patient is status post having had an angiogram with Dr. Lucky Cowboy. Unfortunately she tells me that she still having a lot of discomfort although the wound bed itself seems to be doing better. She does have more swelling of the right lower extremity but nothing too significant. I think this is causing Hanser bit of increased pain right now as well that is unfortunate. No fevers, chills, nausea, or vomiting noted at this time. Patient History Information obtained from Patient. Family History Diabetes - Father,Siblings,Child, Hypertension - Child, Lung Disease -  Child, Stroke - Mother, No family history of Cancer, Heart Disease, Kidney Disease, Seizures, Thyroid Problems. Social History Former smoker, Marital Status - Single, Alcohol Use - Never, Drug Use - No History, Caffeine Use - Daily. Medical History Eyes Patient has history of Cataracts - removed Denies history of Glaucoma, Optic Neuritis Hematologic/Lymphatic Patient has history of Anemia Denies history of Hemophilia, Human Immunodeficiency Virus, Lymphedema, Sickle Cell Disease Respiratory Denies history of Aspiration, Asthma, Chronic Obstructive Pulmonary Disease (COPD), Pneumothorax, Sleep Apnea, Tuberculosis Cardiovascular Patient has history of Arrhythmia, Coronary Artery Disease, Hypertension, Myocardial Infarction Denies history of Angina, Congestive Heart Failure, Deep Vein Thrombosis, Hypotension, Peripheral Arterial Disease, Peripheral Venous Disease, Phlebitis Endocrine Patient has history of Type II Diabetes Denies history of Type I Diabetes Genitourinary Denies history of End Stage Renal Disease Immunological Denies history of Lupus Erythematosus, Raynaud s, Scleroderma Integumentary (Skin) Denies history of History of Burn, History of pressure  wounds Musculoskeletal Patient has history of Osteoarthritis Denies history of Gout, Rheumatoid Arthritis, Osteomyelitis Neurologic Peavy, Dalores C. ([id number]) Denies history of Dementia, Neuropathy, Quadriplegia, Paraplegia, Seizure Disorder Oncologic Denies history of Received Chemotherapy, Received Radiation Medical And Surgical History Notes Constitutional Symptoms (General Health) High Blood Pressure, open heart surgery, gall stone, hysterectomy; Fluid pill, Anxiety Review of Systems (ROS) Constitutional Symptoms (General Health) Denies complaints or symptoms of Fatigue, Fever, Chills, Marked Weight Change. Respiratory Denies complaints or symptoms of Chronic or frequent coughs, Shortness of Breath. Cardiovascular Complains or has symptoms of LE edema. Denies complaints or symptoms of Chest pain. Psychiatric Denies complaints or symptoms of Anxiety, Claustrophobia. Objective Constitutional Well-nourished and well-hydrated in no acute distress. Vitals Time Taken: 12:42 PM, Height: 58 in, Weight: 140 lbs, BMI: 29.3, Temperature: 98.3 F, Pulse: 94 bpm, Respiratory Rate: 16 breaths/min, Blood Pressure: 147/66 mmHg. Respiratory normal breathing without difficulty. clear to auscultation bilaterally. Cardiovascular regular rate and rhythm with normal S1, S2. 2+ pitting edema of the bilateral lower extremities. Psychiatric this patient is able to make decisions and demonstrates good insight into disease process. Alert and Oriented x 3. pleasant and cooperative. General Notes: Upon inspection patient's wound bed actually showed signs of better improvement with a fairly good granulation surface noted that appears to be more red than the pale pink that we have been seeing. I think this is directly attributed to the fact that she has better blood flow. Unfortunately the leg is a Mian bit swollen post procedure at this time. I think this is because a Durocher bit more discomfort for  her. Integumentary (Hair, Skin) Wound #8 status is Open. Original cause of wound was Trauma. The wound is located on the Right,Lateral Lower Leg. The wound measures 1.9cm length x 0.8cm width x 0.1cm depth; 1.194cm^2 area and 0.119cm^3 volume. There is Fat Layer (Subcutaneous Tissue) Exposed exposed. There is no tunneling or undermining noted. There is a medium amount of serous drainage noted. The wound margin is flat and intact. There is medium (34-66%) red granulation within the wound bed. There is a medium (34-66%) amount of necrotic tissue within the wound bed including Adherent Slough. CASIE, STURGEON ([id number]) Assessment Active Problems ICD-10 Chronic venous hypertension (idiopathic) with ulcer and inflammation of right lower extremity Type 2 diabetes mellitus with other skin ulcer Non-pressure chronic ulcer of other part of right lower leg with fat layer exposed Cellulitis of right lower limb Plan Wound Cleansing: Wound #8 Right,Lateral Lower Leg: Clean wound with Normal Saline. Anesthetic (add to Medication List): Wound #8 Right,Lateral Lower Leg: Topical Lidocaine 4%  cream applied to wound bed prior to debridement (In Clinic Only). Primary Wound Dressing: Wound #8 Right,Lateral Lower Leg: Silver Alginate Secondary Dressing: Wound #8 Right,Lateral Lower Leg: Conform/Kerlix XtraSorb Dressing Change Frequency: Wound #8 Right,Lateral Lower Leg: Change Dressing Monday, Wednesday, Friday Follow-up Appointments: Wound #8 Right,Lateral Lower Leg: Return Appointment in 1 week. Edema Control: Wound #8 Right,Lateral Lower Leg: Elevate legs to the level of the heart and pump ankles as often as possible Other: - Tubigrip E Home Health: Wound #8 Right,Lateral Lower Leg: [place] for [place] Nurse may visit PRN to address patient s wound care needs. FACE TO FACE ENCOUNTER: MEDICARE and MEDICAID PATIENTS: I certify that this patient is under my  care and that I had a face-to-face encounter that meets the physician face-to-face encounter requirements with this patient on this date. The encounter with the patient was in whole or in part for the following MEDICAL CONDITION: (primary reason for [place]) MEDICAL NECESSITY: I certify, that based on my findings, NURSING services are a medically necessary home health service. HOME BOUND STATUS: I certify that my clinical findings support that this patient is homebound (i.e., Due to illness or injury, pt requires aid of supportive devices such as crutches, cane, wheelchairs, walkers, the use of special transportation or the assistance of another person to leave their place of residence. There is a normal inability to leave the home and doing so requires considerable and taxing effort. Other absences are for medical reasons / religious services and are infrequent or of short duration when for other reasons). If current dressing causes regression in wound condition, may D/C ordered dressing product/s and apply Normal Saline Moist Dressing daily until next [place] / Other MD appointment. [place] of regression in [name], [name] ([id number]) wound condition at [phone]. Please direct any NON-WOUND related issues/requests for orders to patient's Primary Care Physician My suggestion at this point is gonna be that we go ahead and continue with the above wound care measures for the next week. The patient is in agreement with plan. We will subsequently see were things stand at follow-up. If anything changes or worsens in the meantime she will contact the office and let me know. Otherwise I did talk her into allowing me to use the Tubigrip were also gonna see about initiating home health for time to help her with the dressing changes. Please see above for specific wound care orders. We will see patient for re-evaluation in 1 week(s) here in the clinic.  If anything worsens or changes patient will contact our office for additional recommendations. Electronic Signature(s) Signed: 09/10/2018 4:26:55 PM By: Worthy Keeler PA-C Entered By: Worthy Keeler on 09/10/2018 13:47:03 [name], [name] ([id number]) -------------------------------------------------------------------------------- ROS/PFSH Details Patient Name: [name], [name] Date of Service: 09/10/2018 12:30 PM Medical Record Number: [id number] Patient Account Number: [id number] Date of Birth/Sex: [birth year] (85 y.o. F) Treating RN: Montey Hora Primary Care Provider: Ria Bush Other Clinician: Referring Provider: Ria Bush Treating Provider/Extender: Melburn Hake, Jordi Lacko Weeks in Treatment: 24 Information Obtained From Patient Constitutional Symptoms (General Health) Complaints and Symptoms: Negative for: Fatigue; Fever; Chills; Marked Weight Change Medical History: Past Medical History Notes: High Blood Pressure, open heart surgery, gall stone, hysterectomy; Fluid pill, Anxiety Respiratory Complaints and Symptoms: Negative for: Chronic or frequent coughs; Shortness of Breath Medical History: Negative for: Aspiration; Asthma; Chronic Obstructive Pulmonary Disease (COPD); Pneumothorax; Sleep Apnea; Tuberculosis Cardiovascular Complaints and Symptoms: Positive for: LE edema Negative for:  Chest pain Medical History: Positive for: Arrhythmia; Coronary Artery Disease; Hypertension; Myocardial Infarction Negative for: Angina; Congestive Heart Failure; Deep Vein Thrombosis; Hypotension; Peripheral Arterial Disease; Peripheral Venous Disease; Phlebitis Psychiatric Complaints and Symptoms: Negative for: Anxiety; Claustrophobia Eyes Medical History: Positive for: Cataracts - removed Negative for: Glaucoma; Optic Neuritis Hematologic/Lymphatic Medical History: Positive for: Anemia Negative for: Hemophilia; Human Immunodeficiency Virus; Lymphedema; Sickle Cell  Disease Endocrine [name], MANOCCHIO ([id number]) Medical History: Positive for: Type II Diabetes Negative for: Type I Diabetes Time with diabetes: no idea Treated with: Oral agents Blood sugar tested every day: No Genitourinary Medical History: Negative for: End Stage Renal Disease Immunological Medical History: Negative for: Lupus Erythematosus; Raynaudos; Scleroderma Integumentary (Skin) Medical History: Negative for: History of Burn; History of pressure wounds Musculoskeletal Medical History: Positive for: Osteoarthritis Negative for: Gout; Rheumatoid Arthritis; Osteomyelitis Neurologic Medical History: Negative for: Dementia; Neuropathy; Quadriplegia; Paraplegia; Seizure Disorder Oncologic Medical History: Negative for: Received Chemotherapy; Received Radiation HBO Extended History Items Eyes: Cataracts Immunizations Pneumococcal Vaccine: Received Pneumococcal Vaccination: Yes Implantable Devices No devices added Family and Social History Cancer: No; Diabetes: Yes - Father,Siblings,Child; Heart Disease: No; Hypertension: Yes - Child; Kidney Disease: No; Lung Disease: Yes - Child; Seizures: No; Stroke: Yes - Mother; Thyroid Problems: No; Former smoker; Marital Status - Single; Alcohol Use: Never; Drug Use: No History; Caffeine Use: Daily; Financial Concerns: No; Food, Clothing or Shelter Needs: No; Support System Lacking: No; Transportation Concerns: No Physician Affirmation I have reviewed and agree with the above information. KAMEELA, LEIPOLD ([id number]) Electronic Signature(s) Signed: 09/10/2018 4:19:49 PM By: Montey Hora Signed: 09/10/2018 4:26:55 PM By: Worthy Keeler PA-C Entered By: Worthy Keeler on 09/10/2018 13:46:10 [place], [name] ([id number]) -------------------------------------------------------------------------------- SuperBill Details Patient Name: Wimberly, Verneda C. Date of Service: 09/10/2018 Medical Record Number: [id number] Patient  Account Number: [id number] Date of Birth/Sex: Dec 13, 1927 (85 y.o. F) Treating RN: Montey Hora Primary Care Provider: Ria Bush Other Clinician: Referring Provider: Ria Bush Treating Provider/Extender: Melburn Hake, Quadry Kampa Weeks in Treatment: 24 Diagnosis Coding ICD-10 Codes Code Description I87.331 Chronic venous hypertension (idiopathic) with ulcer and inflammation of right lower extremity E11.622 Type 2 diabetes mellitus with other skin ulcer L97.812 Non-pressure chronic ulcer of other part of right lower leg with fat layer exposed L03.115 Cellulitis of right lower limb Facility Procedures CPT4 Code: 41740814 Description: 99213 - WOUND CARE VISIT-LEV 3 EST PT Modifier: Quantity: 1 Physician Procedures CPT4: Description Modifier Quantity Code [id number] 99214 - WC PHYS LEVEL 4 - EST PT 1 ICD-10 Diagnosis Description I87.331 Chronic venous hypertension (idiopathic) with ulcer and inflammation of right lower extremity E11.622 Type 2 diabetes mellitus with  other skin ulcer L97.812 Non-pressure chronic ulcer of other part of right lower leg with fat layer exposed L03.115 Cellulitis of right lower limb Electronic Signature(s) Signed: 09/10/2018 4:26:55 PM By: Worthy Keeler PA-C Entered By: Worthy Keeler on 09/10/2018 13:47:17

## 2018-09-10 NOTE — Telephone Encounter (Signed)
Pt is calling to request refill for hydrocodone. She has enough to get thru tomorrow. [facility], [address]

## 2018-09-10 NOTE — Progress Notes (Signed)
[name] ([id number]) Visit Report for 09/10/2018 Arrival Information Details Patient Name: [name], [name] Date of Service: 09/10/2018 12:30 PM Medical Record Number: [id number] Patient Account Number: [id number] Date of Birth/Sex: 08/07/1927 (85 y.o. F) Treating RN: [name] Primary Care [name]: [name] Other Clinician: Referring [name]: [name] Treating [name]/Extender: [name], [name]: 24 Visit Information History Since Last Visit Added or deleted any medications: No Patient Arrived: Walker Any new allergies or adverse reactions: No Arrival Time: 12:42 Had a fall or experienced change in No Accompanied By: self activities of daily living that may affect Transfer Assistance: None risk of falls: Signs or symptoms of abuse/neglect since last visito No Hospitalized since last visit: No Pain Present Now: No Electronic Signature(s) Signed: 09/10/2018 3:30:44 PM By: [name] Entered By: [name] on 09/10/2018 12:42:26 [name], [name] ([id number]) -------------------------------------------------------------------------------- Clinic Level of Care Assessment Details Patient Name: [name], [name] Date of Service: 09/10/2018 12:30 PM Medical Record Number: [id number] Patient Account Number: [id number] Date of Birth/Sex: [birth year] (85 y.o. F) Treating RN: [name] Primary Care Allyce Bochicchio: [name] Other Clinician: Referring [name]: [name] Treating [name]/Extender: [name], [name]: 24 Clinic Level of Care Assessment Items TOOL 4 Quantity Score []  - Use when only an EandM is performed on FOLLOW-UP visit 0 ASSESSMENTS - Nursing Assessment / Reassessment X - Reassessment of Co-morbidities (includes updates in patient status) 1 10 X- 1 5 Reassessment of Adherence to [name] Plan ASSESSMENTS - Wound and Skin Assessment / Reassessment X - Simple Wound Assessment /  Reassessment - one wound 1 5 []  - 0 Complex Wound Assessment / Reassessment - multiple wounds []  - 0 Dermatologic / Skin Assessment (not related to wound area) ASSESSMENTS - Focused Assessment X - Circumferential Edema Measurements - multi extremities 1 5 []  - 0 Nutritional Assessment / Counseling / Intervention X- 1 5 Lower Extremity Assessment (monofilament, tuning fork, pulses) []  - 0 Peripheral Arterial Disease Assessment (using hand held doppler) ASSESSMENTS - Ostomy and/or Continence Assessment and Care []  - Incontinence Assessment and Management 0 []  - 0 Ostomy Care Assessment and Management (repouching, etc.) PROCESS - Coordination of Care X - Simple Patient / Family Education for ongoing care 1 15 []  - 0 Complex (extensive) Patient / Family Education for ongoing care X- 1 10 Staff obtains Programmer, systems, Records, Test Results / Process Orders []  - 0 Staff telephones HHA, Nursing Homes / Clarify orders / etc []  - 0 Routine Transfer to another Facility (non-emergent condition) []  - 0 Routine Hospital Admission (non-emergent condition) []  - 0 New Admissions / Biomedical engineer / Ordering NPWT, Apligraf, etc. []  - 0 Emergency Hospital Admission (emergent condition) X- 1 10 Simple Discharge Coordination Stimpson, Edelyn C. ([id number]) []  - 0 Complex (extensive) Discharge Coordination PROCESS - Special Needs []  - Pediatric / Minor Patient Management 0 []  - 0 Isolation Patient Management []  - 0 Hearing / Language / Visual special needs []  - 0 Assessment of Community assistance (transportation, D/C planning, etc.) []  - 0 Additional assistance / Altered mentation []  - 0 Support Surface(s) Assessment (bed, cushion, seat, etc.) INTERVENTIONS - Wound Cleansing / Measurement X - Simple Wound Cleansing - one wound 1 5 []  - 0 Complex Wound Cleansing - multiple wounds X- 1 5 Wound Imaging (photographs - any number of wounds) []  - 0 Wound Tracing (instead of  photographs) X- 1 5 Simple Wound Measurement - one wound []  - 0 Complex Wound Measurement - multiple wounds INTERVENTIONS -  Wound Dressings []  - Small Wound Dressing one or multiple wounds 0 X- 1 15 Medium Wound Dressing one or multiple wounds []  - 0 Large Wound Dressing one or multiple wounds []  - 0 Application of Medications - topical []  - 0 Application of Medications - injection INTERVENTIONS - Miscellaneous []  - External ear exam 0 []  - 0 Specimen Collection (cultures, biopsies, blood, body fluids, etc.) []  - 0 Specimen(s) / Culture(s) sent or taken to Lab for analysis []  - 0 Patient Transfer (multiple staff / Harrel Lemon Lift / Similar devices) []  - 0 Simple Staple / Suture removal (25 or less) []  - 0 Complex Staple / Suture removal (26 or more) []  - 0 Hypo / Hyperglycemic Management (close monitor of Blood Glucose) []  - 0 Ankle / Brachial Index (ABI) - do not check if billed separately X- 1 5 Vital Signs [name], [name] ([id number]) Has the patient been seen at the hospital within the last three years: Yes Total Score: 100 Level Of Care: New/Established - Level 3 Electronic Signature(s) Signed: 09/10/2018 4:19:49 PM By: [name] Entered By: [name] on 09/10/2018 13:15:51 [name], [name] ([id number]) -------------------------------------------------------------------------------- Encounter Discharge Information Details Patient Name: [name], [name] Date of Service: 09/10/2018 12:30 PM Medical Record Number: [id number] Patient Account Number: [id number] Date of Birth/Sex: 04-Mar-1928 (85 y.o. F) Treating RN: [name] Primary Care Tichina Koebel: [name] Other Clinician: Referring Zakhi Dupre: [name] Treating Rewa Weissberg/Extender: [name], [name]: 24 Encounter Discharge Information Items Discharge Condition: Stable Ambulatory Status: Walker Discharge Destination: Home Transportation: Private Auto Accompanied By:  self Schedule Follow-up Appointment: Yes Clinical Summary of Care: Electronic Signature(s) Signed: 09/10/2018 4:19:49 PM By: [name] Entered By: [name] on 09/10/2018 13:38:10 [name], [name] ([id number]) -------------------------------------------------------------------------------- Lower Extremity Assessment Details Patient Name: [name], [name] Date of Service: 09/10/2018 12:30 PM Medical Record Number: [id number] Patient Account Number: [id number] Date of Birth/Sex: [birth year] (85 y.o. F) Treating RN: [name] Primary Care Lakeita Panther: [name] Other Clinician: Referring Reginaldo Hazard: [name] Treating Nicko Daher/Extender: STONE III, [name]: 24 Edema Assessment Assessed: [Left: No] [Right: No] Edema: [Left: N] [Right: o] Electronic Signature(s) Signed: 09/10/2018 3:30:44 PM By: [name] Entered By: [name] on 09/10/2018 12:47:51 [name], [name] ([id number]) -------------------------------------------------------------------------------- Multi Wound Chart Details Patient Name: [name], [name] Date of Service: 09/10/2018 12:30 PM Medical Record Number: [id number] Patient Account Number: [id number] Date of Birth/Sex: [birth year] (85 y.o. 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] [N/A:N/A] Comorbid History: [8:Cataracts, Anemia, Arrhythmia, Coronary Artery Disease, Hypertension, Myocardial Infarction, Type II  Diabetes, Osteoarthritis] [N/A:N/A] Date Acquired: [8:03/09/2018] [N/A:N/A] Weeks of [name]: [8:24] [N/A:N/A] Wound Status: [8:Open] [N/A:N/A] Measurements L x W x D [8:1.9x0.8x0.1] [N/A:N/A] (cm) Area (cm) : [8:1.194] [N/A:N/A] Volume (cm) : [8:0.119] [N/A:N/A] % Reduction in Area: [8:36.70%] [N/A:N/A] % Reduction in Volume: [8:78.90%] [N/A:N/A] Classification: [8:Grade 2] [N/A:N/A] Exudate Amount: [8:Medium] [N/A:N/A] Exudate Type: [8:Serous] [N/A:N/A] Exudate Color: [8:amber] [N/A:N/A] Wound Margin: [8:Flat and Intact] [N/A:N/A] Granulation Amount: [8:Medium (34-66%)] [N/A:N/A] Granulation Quality: [8:Red] [N/A:N/A] Necrotic Amount: [8:Medium (34-66%)] [N/A:N/A] Exposed Structures: [8:Fat Layer (Subcutaneous Tissue) Exposed: Yes Fascia: No Tendon: No Muscle: No Joint: No  Bone: No Small (1-33%)] [N/A:N/A N/A] [name] Notes SHAMECKA, HOCUTT ([id number]) Electronic Signature(s) Signed: 09/10/2018 4:19:49 PM By: [name] Entered By: [name] on 09/10/2018 13:04:10 Interrante, [name] ([id number]) -------------------------------------------------------------------------------- [place] Details Patient Name: [name], [name] Date of Service: 09/10/2018 12:30 PM Medical Record Number: [id number] Patient Account Number: [id number] Date of Birth/Sex: 1928/01/05 (85 y.o. F) Treating RN: [name] Primary Care Walter Min: [name] Other Clinician: Referring Bralee Feldt: [name] Treating Gissella Niblack/Extender: [name], [name]: 24 Active Inactive Abuse / Safety / Falls / Self Care Management Nursing Diagnoses: Potential for falls Goals: Patient will not experience any injury related to falls Date Initiated: 03/23/2018 Target Resolution Date: 06/09/2018 Goal Status: Active Interventions: Assess fall risk on admission and as needed Notes: Orientation to the Wound Care Program Nursing Diagnoses: Knowledge deficit  related to the wound healing center program Goals: Patient/caregiver will verbalize understanding of the [place] Program Date Initiated: 03/23/2018 Target Resolution Date: 06/09/2018 Goal Status: Active Interventions: Provide education on orientation to the wound center Notes: Pain, Acute or Chronic Nursing Diagnoses: Pain, acute or chronic: actual or potential Goals: Patient will verbalize adequate pain control and receive pain control interventions during procedures as needed Date Initiated: 03/23/2018 Target Resolution Date: 06/09/2018 Goal Status: Active Interventions: Assess comfort goal upon admission Westermeyer, Taira C. ([id number]) Notes: Wound/Skin Impairment Nursing Diagnoses: Impaired tissue integrity Goals: Ulcer/skin breakdown will heal within 14 weeks Date Initiated: 03/23/2018 Target Resolution Date: 06/09/2018 Goal Status: Active Interventions: Assess patient/caregiver ability to obtain necessary supplies Assess patient/caregiver ability to perform ulcer/skin care regimen upon admission and as needed Assess ulceration(s) every visit Notes: Electronic Signature(s) Signed: 09/10/2018 4:19:49 PM By: [name] Entered By: [name] on 09/10/2018 13:02:22 Berghuis, Teasia C. ([id number]) -------------------------------------------------------------------------------- Pain Assessment Details Patient Name: [name], [name] Date of Service: 09/10/2018 12:30 PM Medical Record Number: [id number] Patient Account Number: [id number] Date of Birth/Sex: 10/09/1927 (85 y.o. F) Treating RN: [name] Primary Care Kendalynn Wideman: [name] Other Clinician: Referring Chaim Gatley: [name] Treating Saanvi Hakala/Extender: [name], [name]: 24 Active Problems Location of Pain Severity and Description of Pain Patient Has [name] No Site Locations Pain Management and Medication Current Pain Management: Electronic Signature(s) Signed:  09/10/2018 3:30:44 PM By: [name] Entered By: [name] on 09/10/2018 12:42:32 Luna, [name] ([id number]) -------------------------------------------------------------------------------- Patient/Caregiver Education Details Patient Name: Hoppe, Deniece C. Date of Service: 09/10/2018 12:30 PM Medical Record Number: [id number] Patient Account Number: [id number] Date of Birth/Gender: May 12, 1927 (85 y.o. F) Treating RN: [name] Primary Care Physician: [name] Other Clinician: Referring Physician: Ria [name] Treating Physician/Extender: Sharalyn Ink in [name]: 24 Education Assessment Education Provided To: Patient Education Topics Provided Wound/Skin Impairment: Handouts: Other: wound care as ordered Methods: Demonstration, Explain/Verbal Responses: State content correctly Electronic Signature(s) Signed: 09/10/2018 4:19:49 PM By: [name] Entered By: [name] on 09/10/2018 13:16:11 [name], [name] ([id number]) -------------------------------------------------------------------------------- Wound Assessment Details Patient Name: [name], [name] Date of Service: 09/10/2018 12:30 PM Medical Record Number: [id number] Patient Account Number: [id number] Date of Birth/Sex: 12/09/1927 (85 y.o. F) Treating RN: [name] Primary Care Jarmaine Ehrler: [name] Other Clinician: Referring Frenchie Dangerfield: [name] Treating Ramiro Pangilinan/Extender: STONE III, [name]: 24 Wound Status Wound Number: 8 Primary Diabetic Wound/Ulcer of the Lower Extremity Etiology: Wound Location: Right Lower Leg - Lateral Wound Open Wounding Event: Trauma Status: Date Acquired: 03/09/2018 Comorbid Cataracts, Anemia, Arrhythmia, Coronary Artery Weeks Of [name]: 24 History: Disease, Hypertension, Myocardial Infarction, Clustered Wound: No  Type II Diabetes, Osteoarthritis Photos Photo Uploaded By: [name] on 09/10/2018  15:27:58 Wound Measurements Length: (cm) 1.9 Width: (cm) 0.8 Depth: (cm) 0.1 Area: (cm) 1.194 Volume: (cm) 0.119 % Reduction in Area: 36.7% % Reduction in Volume: 78.9% Epithelialization: Small (1-33%) Tunneling: No Undermining: No Wound Description Classification: Grade 2 Foul Odo Wound Margin: Flat and Intact Slough/F Exudate Amount: Medium Exudate Type: Serous Exudate Color: amber r After Cleansing: No ibrino Yes Wound Bed Granulation Amount: Medium (34-66%) Exposed Structure Granulation Quality: Red Fascia Exposed: No Necrotic Amount: Medium (34-66%) Fat Layer (Subcutaneous Tissue) Exposed: Yes Necrotic Quality: Adherent Slough Tendon Exposed: No Muscle Exposed: No Joint Exposed: No Bone Exposed: No Ricke, Caliann C. ([id number]) [name] Notes Wound #8 (Right, Lateral Lower Leg) Notes Sivercel, xtrasorb, conform. Patient refused Tubigrip-states she will not wear because she does not like it! Electronic Signature(s) Signed: 09/10/2018 3:30:44 PM By: [name] Entered By: [name] on 09/10/2018 12:47:28 Moylan, [name] ([id number]) -------------------------------------------------------------------------------- Vitals Details Patient Name: Garcilazo, Dellanira C. Date of Service: 09/10/2018 12:30 PM Medical Record Number: [id number] Patient Account Number: [id number] Date of Birth/Sex: 1928-03-25 (85 y.o. F) Treating RN: [name] Primary Care Lulubelle Simcoe: [name] Other Clinician: Referring Taneil Lazarus: [name] Treating Leydi Winstead/Extender: STONE III, [name]: 24 Vital Signs Time Taken: 12:42 Temperature (F): 98.3 Height (in): 58 Pulse (bpm): 94 Weight (lbs): 140 Respiratory Rate (breaths/min): 16 Body Mass Index (BMI): 29.3 Blood Pressure (mmHg): 147/66 Reference Range: 80 - 120 mg / dl Electronic Signature(s) Signed: 09/10/2018 3:30:44 PM By: [name] Entered By: [name] on 09/10/2018 12:42:54

## 2018-09-10 NOTE — Telephone Encounter (Signed)
Name of Medication: hydrocodone apap 5-325 Name of Pharmacy: [facility] or Written Date and Quantity: # 14 on 07/18/18 Last Office Visit and Type: 07/18/18 back pain Next Office Visit and Type:02/01/19 annual   Last Controlled Substance Agreement Date: 10/16/17 Last UDS:10/11/17

## 2018-09-12 DIAGNOSIS — D649 Anemia, unspecified: Secondary | ICD-10-CM | POA: Diagnosis not present

## 2018-09-12 DIAGNOSIS — E119 Type 2 diabetes mellitus without complications: Secondary | ICD-10-CM | POA: Diagnosis not present

## 2018-09-12 DIAGNOSIS — Z7984 Long term (current) use of oral hypoglycemic drugs: Secondary | ICD-10-CM | POA: Diagnosis not present

## 2018-09-12 DIAGNOSIS — I10 Essential (primary) hypertension: Secondary | ICD-10-CM | POA: Diagnosis not present

## 2018-09-12 DIAGNOSIS — I87331 Chronic venous hypertension (idiopathic) with ulcer and inflammation of right lower extremity: Secondary | ICD-10-CM | POA: Diagnosis not present

## 2018-09-12 DIAGNOSIS — I2510 Atherosclerotic heart disease of native coronary artery without angina pectoris: Secondary | ICD-10-CM | POA: Diagnosis not present

## 2018-09-12 DIAGNOSIS — L03115 Cellulitis of right lower limb: Secondary | ICD-10-CM | POA: Diagnosis not present

## 2018-09-12 DIAGNOSIS — L97812 Non-pressure chronic ulcer of other part of right lower leg with fat layer exposed: Secondary | ICD-10-CM | POA: Diagnosis not present

## 2018-09-12 DIAGNOSIS — M1990 Unspecified osteoarthritis, unspecified site: Secondary | ICD-10-CM | POA: Diagnosis not present

## 2018-09-12 MED ORDER — HYDROCODONE-ACETAMINOPHEN 5-325 MG PO TABS: 0.5000 | ORAL_TABLET | Freq: Four times a day (QID) | ORAL | 0 refills | Status: DC | PRN

## 2018-09-12 NOTE — Telephone Encounter (Signed)
Eprescribed.

## 2018-09-14 DIAGNOSIS — L97812 Non-pressure chronic ulcer of other part of right lower leg with fat layer exposed: Secondary | ICD-10-CM | POA: Diagnosis not present

## 2018-09-14 DIAGNOSIS — L03115 Cellulitis of right lower limb: Secondary | ICD-10-CM | POA: Diagnosis not present

## 2018-09-14 DIAGNOSIS — I2510 Atherosclerotic heart disease of native coronary artery without angina pectoris: Secondary | ICD-10-CM | POA: Diagnosis not present

## 2018-09-14 DIAGNOSIS — E119 Type 2 diabetes mellitus without complications: Secondary | ICD-10-CM | POA: Diagnosis not present

## 2018-09-14 DIAGNOSIS — I10 Essential (primary) hypertension: Secondary | ICD-10-CM | POA: Diagnosis not present

## 2018-09-14 DIAGNOSIS — I87331 Chronic venous hypertension (idiopathic) with ulcer and inflammation of right lower extremity: Secondary | ICD-10-CM | POA: Diagnosis not present

## 2018-09-17 ENCOUNTER — Other Ambulatory Visit: Payer: Self-pay

## 2018-09-17 ENCOUNTER — Encounter: Payer: Medicare Other | Admitting: Physician Assistant

## 2018-09-17 DIAGNOSIS — E11622 Type 2 diabetes mellitus with other skin ulcer: Secondary | ICD-10-CM | POA: Diagnosis not present

## 2018-09-17 DIAGNOSIS — I2510 Atherosclerotic heart disease of native coronary artery without angina pectoris: Secondary | ICD-10-CM | POA: Diagnosis not present

## 2018-09-17 DIAGNOSIS — L97812 Non-pressure chronic ulcer of other part of right lower leg with fat layer exposed: Secondary | ICD-10-CM | POA: Diagnosis not present

## 2018-09-17 DIAGNOSIS — I252 Old myocardial infarction: Secondary | ICD-10-CM | POA: Diagnosis not present

## 2018-09-17 DIAGNOSIS — L03115 Cellulitis of right lower limb: Secondary | ICD-10-CM | POA: Diagnosis not present

## 2018-09-17 DIAGNOSIS — E11621 Type 2 diabetes mellitus with foot ulcer: Secondary | ICD-10-CM | POA: Diagnosis not present

## 2018-09-19 DIAGNOSIS — E119 Type 2 diabetes mellitus without complications: Secondary | ICD-10-CM | POA: Diagnosis not present

## 2018-09-19 DIAGNOSIS — I10 Essential (primary) hypertension: Secondary | ICD-10-CM | POA: Diagnosis not present

## 2018-09-19 DIAGNOSIS — L03115 Cellulitis of right lower limb: Secondary | ICD-10-CM | POA: Diagnosis not present

## 2018-09-19 DIAGNOSIS — L97812 Non-pressure chronic ulcer of other part of right lower leg with fat layer exposed: Secondary | ICD-10-CM | POA: Diagnosis not present

## 2018-09-19 DIAGNOSIS — I87331 Chronic venous hypertension (idiopathic) with ulcer and inflammation of right lower extremity: Secondary | ICD-10-CM | POA: Diagnosis not present

## 2018-09-19 DIAGNOSIS — I2510 Atherosclerotic heart disease of native coronary artery without angina pectoris: Secondary | ICD-10-CM | POA: Diagnosis not present

## 2018-09-19 NOTE — Progress Notes (Signed)
ZALA, DEGRASSE ([id number]) Visit Report for 09/17/2018 Arrival Information Details Patient Name: [name], [name] Date of Service: 09/17/2018 1:45 PM Medical Record Number: [id number] Patient Account Number: [id number] Date of Birth/Sex: 11-Mar-1928 (85 y.o. F) Treating RN: Cornell Barman Primary Care Cru Kritikos: Ria Bush Other Clinician: Referring Mera Gunkel: Ria Bush Treating Lawrence Roldan/Extender: Melburn Hake, HOYT Weeks in Treatment: 25 Visit Information History Since Last Visit Added or deleted any medications: No Patient Arrived: Walker Any new allergies or adverse reactions: No Arrival Time: 13:40 Had a fall or experienced change in No Accompanied By: self activities of daily living that may affect Transfer Assistance: None risk of falls: Patient Identification Verified: Yes Signs or symptoms of abuse/neglect since last visito No Secondary Verification Process Completed: Yes Hospitalized since last visit: No Implantable device outside of the clinic excluding No cellular tissue based products placed in the center since last visit: Has Dressing in Place as Prescribed: Yes Pain Present Now: Yes Electronic Signature(s) Signed: 09/17/2018 4:30:53 PM By: Gretta Cool, BSN, RN, CWS, Kim RN, BSN Entered By: Gretta Cool, BSN, RN, CWS, Kim on 09/17/2018 13:41:17 [name], [name] ([id number]) -------------------------------------------------------------------------------- Clinic Level of Care Assessment Details Patient Name: Ergle, Hiilani C. Date of Service: 09/17/2018 1:45 PM Medical Record Number: [id number] Patient Account Number: [id number] Date of Birth/Sex: [birth year] (85 y.o. F) Treating RN: Harold Barban Primary Care Lavette Yankovich: Ria Bush Other Clinician: Referring Skyelyn Scruggs: Ria Bush Treating Jerimiah Wolman/Extender: Melburn Hake, HOYT Weeks in Treatment: 25 Clinic Level of Care Assessment Items TOOL 4 Quantity Score []  - Use when only an EandM is performed on  FOLLOW-UP visit 0 ASSESSMENTS - Nursing Assessment / Reassessment X - Reassessment of Co-morbidities (includes updates in patient status) 1 10 X- 1 5 Reassessment of Adherence to Treatment Plan ASSESSMENTS - Wound and Skin Assessment / Reassessment []  - Simple Wound Assessment / Reassessment - one wound 0 X- 2 5 Complex Wound Assessment / Reassessment - multiple wounds []  - 0 Dermatologic / Skin Assessment (not related to wound area) ASSESSMENTS - Focused Assessment []  - Circumferential Edema Measurements - multi extremities 0 []  - 0 Nutritional Assessment / Counseling / Intervention []  - 0 Lower Extremity Assessment (monofilament, tuning fork, pulses) []  - 0 Peripheral Arterial Disease Assessment (using hand held doppler) ASSESSMENTS - Ostomy and/or Continence Assessment and Care []  - Incontinence Assessment and Management 0 []  - 0 Ostomy Care Assessment and Management (repouching, etc.) PROCESS - Coordination of Care X - Simple Patient / Family Education for ongoing care 1 15 []  - 0 Complex (extensive) Patient / Family Education for ongoing care []  - 0 Staff obtains Programmer, systems, Records, Test Results / Process Orders X- 1 10 Staff telephones HHA, Nursing Homes / Clarify orders / etc []  - 0 Routine Transfer to another Facility (non-emergent condition) []  - 0 Routine Hospital Admission (non-emergent condition) []  - 0 New Admissions / Biomedical engineer / Ordering NPWT, Apligraf, etc. []  - 0 Emergency Hospital Admission (emergent condition) X- 1 10 Simple Discharge Coordination Persons, Mayme C. ([id number]) []  - 0 Complex (extensive) Discharge Coordination PROCESS - Special Needs []  - Pediatric / Minor Patient Management 0 []  - 0 Isolation Patient Management []  - 0 Hearing / Language / Visual special needs []  - 0 Assessment of Community assistance (transportation, D/C planning, etc.) []  - 0 Additional assistance / Altered mentation []  - 0 Support Surface(s)  Assessment (bed, cushion, seat, etc.) INTERVENTIONS - Wound Cleansing / Measurement X - Simple Wound Cleansing - one wound 1 5 []  - 0 Complex Wound Cleansing -  multiple wounds X- 1 5 Wound Imaging (photographs - any number of wounds) []  - 0 Wound Tracing (instead of photographs) X- 1 5 Simple Wound Measurement - one wound []  - 0 Complex Wound Measurement - multiple wounds INTERVENTIONS - Wound Dressings X - Small Wound Dressing one or multiple wounds 1 10 []  - 0 Medium Wound Dressing one or multiple wounds []  - 0 Large Wound Dressing one or multiple wounds []  - 0 Application of Medications - topical []  - 0 Application of Medications - injection INTERVENTIONS - Miscellaneous []  - External ear exam 0 []  - 0 Specimen Collection (cultures, biopsies, blood, body fluids, etc.) []  - 0 Specimen(s) / Culture(s) sent or taken to Lab for analysis []  - 0 Patient Transfer (multiple staff / Civil Service fast streamer / Similar devices) []  - 0 Simple Staple / Suture removal (25 or less) []  - 0 Complex Staple / Suture removal (26 or more) []  - 0 Hypo / Hyperglycemic Management (close monitor of Blood Glucose) []  - 0 Ankle / Brachial Index (ABI) - do not check if billed separately X- 1 5 Vital Signs [name], [name] ([id number]) Has the patient been seen at the hospital within the last three years: Yes Total Score: 90 Level Of Care: New/Established - Level 3 Electronic Signature(s) Signed: 09/17/2018 3:14:44 PM By: Harold Barban Entered By: Harold Barban on 09/17/2018 13:57:27 [name], [name] ([id number]) -------------------------------------------------------------------------------- Encounter Discharge Information Details Patient Name: [name], [name] Date of Service: 09/17/2018 1:45 PM Medical Record Number: [id number] Patient Account Number: [id number] Date of Birth/Sex: 1927/11/01 (85 y.o. F) Treating RN: Cornell Barman Primary Care Paulene Tayag: Ria Bush Other Clinician: Referring  Oziel Beitler: Ria Bush Treating Serafin Decatur/Extender: Melburn Hake, HOYT Weeks in Treatment: 25 Encounter Discharge Information Items Discharge Condition: Stable Ambulatory Status: Walker Discharge Destination: Home Transportation: Private Auto Accompanied By: self Schedule Follow-up Appointment: Yes Clinical Summary of Care: Electronic Signature(s) Signed: 09/17/2018 4:30:53 PM By: Gretta Cool, BSN, RN, CWS, Kim RN, BSN Entered By: Gretta Cool, BSN, RN, CWS, Kim on 09/17/2018 14:08:40 Seese, [name] ([id number]) -------------------------------------------------------------------------------- Lower Extremity Assessment Details Patient Name: [name], [name] Date of Service: 09/17/2018 1:45 PM Medical Record Number: [id number] Patient Account Number: [id number] Date of Birth/Sex: 12-10-1927 (85 y.o. F) Treating RN: Cornell Barman Primary Care Aslan Himes: Ria Bush Other Clinician: Referring Breda Bond: Ria Bush Treating Shericka Johnstone/Extender: Melburn Hake, HOYT Weeks in Treatment: 25 Vascular Assessment Pulses: Dorsalis Pedis Palpable: [Right:Yes] Electronic Signature(s) Signed: 09/17/2018 4:30:53 PM By: Gretta Cool, BSN, RN, CWS, Kim RN, BSN Entered By: Gretta Cool, BSN, RN, CWS, Kim on 09/17/2018 13:45:39 Rosene, [name] ([id number]) -------------------------------------------------------------------------------- Multi Wound Chart Details Patient Name: [name], [name] Date of Service: 09/17/2018 1:45 PM Medical Record Number: [id number] Patient Account Number: [id number] Date of Birth/Sex: [birth year] (85 y.o. F) Treating RN: Harold Barban Primary Care Nilda Keathley: Ria Bush Other Clinician: Referring Omni Dunsworth: Ria Bush Treating Shirla Hodgkiss/Extender: Melburn Hake, HOYT Weeks in Treatment: 25 Vital Signs Height(in): 58 Pulse(bpm): 77 Weight(lbs): 140 Blood Pressure(mmHg): 136/76 Body Mass Index(BMI): 29 Temperature(F): 97.9 Respiratory Rate 16 (breaths/min): Photos:  [N/A:N/A] Wound Location: Right Lower Leg - Lateral N/A N/A Wounding Event: Trauma N/A N/A Primary Etiology: Diabetic Wound/Ulcer of the N/A N/A Lower Extremity Comorbid History: Cataracts, Anemia, N/A N/A Arrhythmia, Coronary Artery Disease, Hypertension, Myocardial Infarction, Type II Diabetes, Osteoarthritis Date Acquired: 03/09/2018 N/A N/A Weeks of Treatment: 25 N/A N/A Wound Status: Open N/A N/A Measurements L x W x D 2x0.1x0.1 N/A N/A (cm) Area (cm) : 0.157 N/A N/A Volume (cm) : 0.016 N/A N/A % Reduction in  Area: 91.70% N/A N/A % Reduction in Volume: 97.20% N/A N/A Classification: Grade 2 N/A N/A Exudate Amount: Medium N/A N/A Exudate Type: Serous N/A N/A Exudate Color: amber N/A N/A Wound Margin: Flat and Intact N/A N/A Granulation Amount: Small (1-33%) N/A N/A Granulation Quality: Pink N/A N/A Necrotic Amount: Large (67-100%) N/A N/A Exposed Structures: Fat Layer (Subcutaneous N/A N/A Tissue) Exposed: Yes Fascia: No Delap, Sakshi C. ([id number]) Tendon: No Muscle: No Joint: No Bone: No Epithelialization: Small (1-33%) N/A N/A Treatment Notes Electronic Signature(s) Signed: 09/17/2018 3:14:44 PM By: Harold Barban Entered By: Harold Barban on 09/17/2018 13:53:40 Friedland, [name] ([id number]) -------------------------------------------------------------------------------- [place] Details Patient Name: [name], [name] Date of Service: 09/17/2018 1:45 PM Medical Record Number: [id number] Patient Account Number: [id number] Date of Birth/Sex: 1927-11-25 (85 y.o. F) Treating RN: Harold Barban Primary Care Derak Schurman: Ria Bush Other Clinician: Referring Dewaine Morocho: Ria Bush Treating Perry [name]/Extender: Melburn Hake, HOYT Weeks in Treatment: 25 Active Inactive Abuse / Safety / Falls / Self Care Management Nursing Diagnoses: Potential for falls Goals: Patient will not experience any injury related to falls Date Initiated:  03/23/2018 Target Resolution Date: 06/09/2018 Goal Status: Active Interventions: Assess fall risk on admission and as needed Notes: Orientation to the Wound Care Program Nursing Diagnoses: Knowledge deficit related to the wound healing center program Goals: Patient/caregiver will verbalize understanding of the [facility] [place] [facility] Date Initiated: 03/23/2018 Target Resolution Date: 06/09/2018 Goal Status: Active Interventions: Provide education on orientation to the wound center Notes: Pain, Acute or Chronic Nursing Diagnoses: Pain, acute or chronic: actual or potential Goals: Patient will verbalize adequate pain control and receive pain control interventions during procedures as needed Date Initiated: 03/23/2018 Target Resolution Date: 06/09/2018 Goal Status: Active Interventions: Assess comfort goal upon admission [name], [name] ([id number]) Notes: Wound/Skin Impairment Nursing Diagnoses: Impaired tissue integrity Goals: Ulcer/skin breakdown will heal within 14 weeks Date Initiated: 03/23/2018 Target Resolution Date: 06/09/2018 Goal Status: Active Interventions: Assess patient/caregiver ability to obtain necessary supplies Assess patient/caregiver ability to perform ulcer/skin care regimen upon admission and as needed Assess ulceration(s) every visit Notes: Electronic Signature(s) Signed: 09/17/2018 3:14:44 PM By: Harold Barban Entered By: Harold Barban on 09/17/2018 13:53:24 [name], [name] ([id number]) -------------------------------------------------------------------------------- Pain Assessment Details Patient Name: [name], [name] Date of Service: 09/17/2018 1:45 PM Medical Record Number: [id number] Patient Account Number: [id number] Date of Birth/Sex: [birth year] (85 y.o. F) Treating RN: Cornell Barman Primary Care Lamar Meter: Ria Bush Other Clinician: Referring Jenell Dobransky: Ria Bush Treating Dhiren Azimi/Extender: Melburn Hake, HOYT Weeks in  Treatment: 25 Active Problems Location of Pain Severity and Description of Pain Patient Has [name] Yes Site Locations Pain Location: Pain in Ulcers Rate the pain. Current Pain Level: 7 Pain Management and Medication Current Pain Management: Electronic Signature(s) Signed: 09/17/2018 4:30:53 PM By: Gretta Cool, BSN, RN, CWS, Kim RN, BSN Entered By: Gretta Cool, BSN, RN, CWS, Kim on 09/17/2018 13:41:32 Guerrier, [name] ([id number]) -------------------------------------------------------------------------------- Patient/Caregiver Education Details Patient Name: [name], [name] Date of Service: 09/17/2018 1:45 PM Medical Record Number: [id number] Patient Account Number: [id number] Date of Birth/Gender: [birth year] (85 y.o. F) Treating RN: Harold Barban Primary Care Physician: Ria Bush Other Clinician: Referring Physician: Ria Bush Treating Physician/Extender: Sharalyn Ink in Treatment: 25 Education Assessment Education Provided To: Patient Education Topics Provided Wound/Skin Impairment: Handouts: Caring for Your Ulcer Methods: Demonstration, Explain/Verbal Responses: State content correctly Electronic Signature(s) Signed: 09/17/2018 3:14:44 PM By: Harold Barban Entered By: Harold Barban on 09/17/2018 13:53:58 [name], [name] ([id number]) -------------------------------------------------------------------------------- Wound Assessment Details Patient Name: Denver Faster  C. Date of Service: 09/17/2018 1:45 PM Medical Record Number: [id number] Patient Account Number: [id number] Date of Birth/Sex: 10/24/1927 (85 y.o. F) Treating RN: Cornell Barman Primary Care Mathayus Stanbery: Ria Bush Other Clinician: Referring Ura Hausen: Ria Bush Treating Myleen Brailsford/Extender: Melburn Hake, HOYT Weeks in Treatment: 25 Wound Status Wound Number: 8 Primary Diabetic Wound/Ulcer of the Lower Extremity Etiology: Wound Location: Right Lower Leg - Lateral Wound Open Wounding  Event: Trauma Status: Date Acquired: 03/09/2018 Comorbid Cataracts, Anemia, Arrhythmia, Coronary Artery Weeks Of Treatment: 25 History: Disease, Hypertension, Myocardial Infarction, Clustered Wound: No Type II Diabetes, Osteoarthritis Photos Wound Measurements Length: (cm) 2 % Reduction in Width: (cm) 0.1 % Reduction in Depth: (cm) 0.1 Epithelializat Area: (cm) 0.157 Tunneling: Volume: (cm) 0.016 Undermining: Area: 91.7% Volume: 97.2% ion: Small (1-33%) No No Wound Description Classification: Grade 2 Foul Odor Afte Wound Margin: Flat and Intact Slough/Fibrino Exudate Amount: Medium Exudate Type: Serous Exudate Color: amber r Cleansing: No Yes Wound Bed Granulation Amount: Small (1-33%) Exposed Structure Granulation Quality: Pink Fascia Exposed: No Necrotic Amount: Large (67-100%) Fat Layer (Subcutaneous Tissue) Exposed: Yes Necrotic Quality: Adherent Slough Tendon Exposed: No Muscle Exposed: No Joint Exposed: No Bone Exposed: No Treatment Notes Prosch, Lupie C. ([id number]) Wound #8 (Right, Lateral Lower Leg) Notes Sivercel, xtrasorb, conform. Patient refused Tubigrip-states she will not wear because she does not like it! Electronic Signature(s) Signed: 09/17/2018 4:30:53 PM By: Gretta Cool, BSN, RN, CWS, Kim RN, BSN Entered By: Gretta Cool, BSN, RN, CWS, Kim on 09/17/2018 13:45:13 Cockrum, [name] ([id number]) -------------------------------------------------------------------------------- [place] Details Patient Name: Ahlgren, Shalee C. Date of Service: 09/17/2018 1:45 PM Medical Record Number: [id number] Patient Account Number: [id number] Date of Birth/Sex: 1927-12-30 (85 y.o. F) Treating RN: Cornell Barman Primary Care Kalan Rinn: Ria Bush Other Clinician: Referring Ilyana Manuele: Ria Bush Treating Tavia Stave/Extender: Melburn Hake, HOYT Weeks in Treatment: 25 Vital Signs Time Taken: 13:41 Temperature (F): 97.9 Height (in): 58 Pulse (bpm): 77 Weight (lbs):  140 Respiratory Rate (breaths/min): 16 Body Mass Index (BMI): 29.3 Blood Pressure (mmHg): 136/76 Reference Range: 80 - 120 mg / dl Electronic Signature(s) Signed: 09/17/2018 4:30:53 PM By: Gretta Cool, BSN, RN, CWS, Kim RN, BSN Entered By: Gretta Cool, BSN, RN, CWS, Kim on 09/17/2018 13:42:50

## 2018-09-20 NOTE — Progress Notes (Signed)
ARTEMISA, SLADEK ([id number]) Visit Report for 09/17/2018 Chief Complaint Document Details Patient Name: [name], [name] Date of Service: 09/17/2018 1:45 PM Medical Record Number: [id number] Patient Account Number: [id number] Date of Birth/Sex: [birth year] (85 y.o. F) Treating RN: Harold Barban Primary Care Provider: Ria Bush Other Clinician: Referring Provider: Ria Bush Treating Provider/Extender: Melburn Hake, HOYT Weeks in Treatment: 25 Information Obtained from: Patient Chief Complaint RLE wounds Electronic Signature(s) Signed: 09/19/2018 6:29:07 AM By: Worthy Keeler PA-C Entered By: Worthy Keeler on 09/17/2018 13:46:30 [name], [name] ([id number]) -------------------------------------------------------------------------------- HPI Details Patient Name: [name], [name] Date of Service: 09/17/2018 1:45 PM Medical Record Number: [id number] Patient Account Number: [id number] Date of Birth/Sex: 1927/06/03 (85 y.o. F) Treating RN: Harold Barban Primary Care Provider: Ria Bush Other Clinician: Referring Provider: Ria Bush Treating Provider/Extender: Melburn Hake, HOYT Weeks in Treatment: 25 History of Present Illness HPI Description: Readmission: 03/23/18 on evaluation today patient presents for readmission entire clinic concerning issues that she has been having for the past couple of weeks in regard to her right lower extremity. The medial injury actually occurred when her dog tried to take a blanket from her and subsequently scratched her with his toenail. Subsequently the lateral injury was a wound where she dropped a knife in her kitchen and hit the side of her leg. With that being said both have been open for some time and home health nurse that has been coming out marked for her where the redness was several days ago. Nonetheless this has spread will be on the margin of what was marked. She was placed on Keflex which was started two days  ago but at this point it does not seem to have helped with any improvement in the overall appearance of the wounds. No fevers, chills, nausea, or vomiting noted at this time. 04/02/18 Seen today for follow up and management of right lower extremity wounds. Right lateral wound with adherent slough. She is complaining of pain to touch which limited exam. Recently treated in the ED for cellulitis and wound culture positive for MRSA. Completed a dose of Vancomycin and transitioned to doxycycline by mouth. Erythema remains present with improvement since last visit. Right medial wound with scab present. No fevers, chills, nausea, or vomiting noted at this time. 04/10/18 on evaluation today patient appears to be doing a Blowers better in regard to her right lateral lower [name]. The slough does seem to be coming off although it is slow from the wound bed. Fortunately there does not appear to be any evidence of infection at this time which is good news. With that being said I do believe that she is going to require longer treatment with the Santyl simply due to the fact that I'm not able to really debride the wound due to the pain that she is experiencing. 04/16/18 upon evaluation today patient's wound bed currently on the lateral lower extremity on the right appears to be doing a Hocutt better at this point. We are utilizing sensible currently. Overall I'm very pleased with how things are progressing although there still is some work to do before this will be completely healed. No fevers, chills, nausea, or vomiting noted at this time. 04/23/18 on evaluation today patient appears to be doing about the same at this point maybe just a Blissett bit smaller than last week's evaluation. She continues to have some slough buildup on the surface of the wound and has very Grinder granulation unfortunately. This seems to be very slow moving  I'm getting somewhat concerned about the possibility of her  having decreased blood flow which is leading to her subsequently not being able to heal this area as quickly as she shut otherwise. She is currently utilizing Santyl. 05/01/18 on evaluation today patient appears to be doing somewhat poorly in regard to her lower extremity ulcer. At this point she's been tolerating the dressing changes to some degree without complication. With that being said she does note of the past several days that she has been having more pain unfortunately. On inspection it appears that she may have signs of infection which would account for the pain. 05/07/18 on evaluation today patient's wound actually appears to be doing better she's not having as much erythema in the right lower extremity as compared to last week. Fortunately there's no signs of infection this is good news as well. Overall very pleased with the progress that she has made up to this point. 05/14/18 on evaluation today patient actually appears to be doing rather well in regard to her right lower extremity ulcer. She does not have erythema that was characterizing the cellulitis prior to initiation of antibiotic therapy. With that being said her wound still appears to be somewhat slow in healing I believe this may be arterial in nature that is my concern at least. With that being said I do want her to see the vascular specialist as soon as possible we been trying to get this set up I'm not sure if she's not answering the phone or what exactly is going on here. [name], [name] ([id number]) 05/21/18 on evaluation today patient's wound actually appears to be doing a Holaday better possibly some additional granulation compared to my last evaluation. With that being said she still continues to have a lot of pain especially when her legs are elevated such as sitting in the chair here in the office. There does not appear to be any signs of infection which is good news. 05/28/18 on evaluation today patient appears to be  doing a Debellis better in regard to her right lateral lower [name]. Again she was supposed to have an appointment with vascular unfortunately they attempted to call her three times and unfortunately were unable to get anything scheduled. That reason closed up for him. She tells me that if the phone is answered and she doesn't recognize what is that she will hang up as well those that she lives with. Obviously she's not gonna recognize somebody calling from the office that she has not previously spoken with. Nonetheless the patient states that she does not remember any fall. I think if we're gonna get her in with vascular specialist we're gonna have to try to make the appointment for her. 06/04/2018 Seen today for follow up and management of right lateral lower extremity wound. Adherent slough present over surface of wound. Scheduled to follow up for ABI/TBI on 06/06/2018 with vein and vascular specialist. She is hard of hearing. Wound is sensitive to touch. No signs or symptoms of infection. Denies any recent fevers, chills, or SOB. 06/11/18 on evaluation today patient actually appears to be doing very well in regard to her right lateral lower extremity ulcer. She's been tolerating the dressing changes without complication. Fortunately there's no evidence of infection. With that being said she did have her arterial studies which revealed that she had a very low ABI which was 0.67 on the right with a TBI of 0.32 and 0.65 on the left with a TBI of 0.16. I  do believe she needs to see vascular and we are working on that referral as well. 06/21/18 on evaluation today patient's wound actually appears to be doing okay she does have an appointment with vascular coming up on the 24th. Nonetheless she's been having some issues that are more urinary in nature right now that are causing trouble for her. Fortunately there's no signs of severe sepsis although she is running a low-grade temperature 99.9  on evaluation today. She also is having some issues right now with chills as well as urinary urgency and frequency. Again I feel like the city is more urinary and symptomology I do not believe she has anything upper respiratory which is good news. Nonetheless I think this may need to be checked out more quickly especially in light of the fact that she's having significant back pain at this point. 07/02/18 on evaluation today patient appears to be doing okay in regard to her ulcer on the right lower extremity. She's been tolerating the dressing changes without complication. Fortunately there does not appear to be any signs of infection which is good news. No fevers, chills, nausea, or vomiting noted at this time. 07/10/18 on evaluation today patient appears to be doing well in regard to her right lateral lower extremity ulcer all things considering. She's been using this form this seems to be the one thing that she can really do on her own and she seems to be making some progress obviously help with this matter. I think the biggest issue limiting her feeling speed is her blood flow she canceled her original appointment with vascular her next one is not till May. She was worried about the Covid-19 Virus and having to see another physician. Nonetheless there's no signs of active infection at this time. 07/16/18 on evaluation today patient appears to be doing okay in regard to her right lateral lower extremity ulcer. She's been tolerating the dressing changes without complication. Fortunately there's no signs of active infection at this time. No fevers, chills, nausea, or vomiting noted at this time. 07/23/18 on evaluation today patient appears to be doing very well in regard to her right lateral load should be ulcer all things considering. She still again is not seeing vascular until mid May in the meantime she's been utilizing the over-the-counter tripling about appointment which seems to be the only  thing she's able to do routinely on her own without getting confused or frustrated. Overall it seems to be doing okay fortunately she's showing no signs of infection. 07/30/18 upon evaluation today patient actually appears to be doing about the same in regard to her wound in general. She is making very slow progress most likely again due to the issue with her blood flow she sees vascular towards the middle of news. In the meantime we've really been trying to just keep things in the control from the infection standpoint and allow for any healing that could take place up until appointment she can see vascular. 08/06/18 upon evaluation today patient's wound actually appears to be doing about the same although the wound bed may show Jafri bit more granulation which is good news. Fortunately there's no signs of active infection at this time. No fevers, chills, nausea, or vomiting noted at this time. 08/14/18 on evaluation today patient actually appears to be doing about the same in regard to her lower extremity ulcer. Redford, Makaylee C. ([id number]) Fortunately there's no signs of active infection at this time which is good news. Fortunately the patient  has been tolerating the dressing changes without complication. No fevers, chills, nausea, or vomiting noted at this time. She does have her appointment with vascular next week. 08/20/18 on evaluation today patient actually appears to be doing a Mercadel better in my opinion with regard to her lower extremity ulcer. She's been tolerating the dressing changes without complication. Fortunately there's no signs of active infection at this time which is good news. She actually will be seen vascular tomorrow. 08/30/18 on evaluation today patient's wound actually appears to be showing some signs of improvement which is good news. Fortunately there's no signs of infection. She does the the vascular doctor on June 4. 09/10/18 on evaluation today patient is status post  having had an angiogram with Dr. Lucky Cowboy. Unfortunately she tells me that she still having a lot of discomfort although the wound bed itself seems to be doing better. She does have more swelling of the right lower extremity but nothing too significant. I think this is causing Kovar bit of increased pain right now as well that is unfortunate. No fevers, chills, nausea, or vomiting noted at this time. 09/17/18 on evaluation today patient actually appears to be doing much better today compared to last week's evaluation. I'm very pleased in this regard. Fortunately there's no signs of active infection which is also good news. Electronic Signature(s) Signed: 09/19/2018 6:29:07 AM By: Worthy Keeler PA-C Entered By: Worthy Keeler on 09/17/2018 23:46:49 Hepner, [name] ([id number]) -------------------------------------------------------------------------------- Physical Exam Details Patient Name: Parfait, Anshika C. Date of Service: 09/17/2018 1:45 PM Medical Record Number: [id number] Patient Account Number: [id number] Date of Birth/Sex: 11/06/1927 (85 y.o. F) Treating RN: Harold Barban Primary Care Provider: Ria Bush Other Clinician: Referring Provider: Ria Bush Treating Provider/Extender: STONE III, HOYT Weeks in Treatment: 60 Constitutional Well-nourished and well-hydrated in no acute distress. Respiratory normal breathing without difficulty. clear to auscultation bilaterally. Cardiovascular regular rate and rhythm with normal S1, S2. Psychiatric this patient is able to make decisions and demonstrates good insight into disease process. Alert and Oriented x 3. pleasant and cooperative. Notes Patient's wound bed still show signs of additional epithelialization there does not appear to be signs of active infection which is good news. Overall very pleased with how things seem to be progressing at this time. Electronic Signature(s) Signed: 09/19/2018 6:29:07 AM By: Worthy Keeler PA-C Entered By: Worthy Keeler on 09/17/2018 23:47:17 [name], [name] ([id number]) -------------------------------------------------------------------------------- Physician Orders Details Patient Name: Kupfer, Stefan C. Date of Service: 09/17/2018 1:45 PM Medical Record Number: [id number] Patient Account Number: [id number] Date of Birth/Sex: Jun 20, 1927 (85 y.o. F) Treating RN: Harold Barban Primary Care Provider: Ria Bush Other Clinician: Referring Provider: Ria Bush Treating Provider/Extender: Melburn Hake, HOYT Weeks in Treatment: 25 Verbal / Phone Orders: No Diagnosis Coding ICD-10 Coding Code Description I87.331 Chronic venous hypertension (idiopathic) with ulcer and inflammation of right lower extremity E11.622 Type 2 diabetes mellitus with other skin ulcer L97.812 Non-pressure chronic ulcer of other part of right lower leg with fat layer exposed L03.115 Cellulitis of right lower limb Wound Cleansing Wound #8 Right,Lateral Lower Leg o Clean wound with Normal Saline. Anesthetic (add to Medication List) Wound #8 Right,Lateral Lower Leg o Topical Lidocaine 4% cream applied to wound bed prior to debridement (In Clinic Only). Primary Wound Dressing Wound #8 Right,Lateral Lower Leg o Silver Alginate Secondary Dressing Wound #8 Right,Lateral Lower Leg o Conform/Kerlix o XtraSorb - Stretchy to secure Dressing Change Frequency Wound #8 Right,Lateral Lower Leg o Change Dressing Monday, Wednesday,  Friday Follow-up Appointments Wound #8 Right,Lateral Lower Leg o Return Appointment in 1 week. Edema Control Wound #8 Right,Lateral Lower Leg o Elevate legs to the level of the heart and pump ankles as often as possible Home Health Wound #8 Right,Lateral Lower Leg o [place] for [place] Nurse may visit PRN to address patientos wound care needs. [name], STALLING ([id number]) o FACE TO FACE ENCOUNTER:  MEDICARE and MEDICAID PATIENTS: I certify that this patient is under my care and that I had a face-to-face encounter that meets the physician face-to-face encounter requirements with this patient on this date. The encounter with the patient was in whole or in part for the following MEDICAL CONDITION: (primary reason for [place]) MEDICAL NECESSITY: I certify, that based on my findings, NURSING services are a medically necessary home health service. HOME BOUND STATUS: I certify that my clinical findings support that this patient is homebound (i.e., Due to illness or injury, pt requires aid of supportive devices such as crutches, cane, wheelchairs, walkers, the use of special transportation or the assistance of another person to leave their place of residence. There is a normal inability to leave the home and doing so requires considerable and taxing effort. Other absences are for medical reasons / religious services and are infrequent or of short duration when for other reasons). o If current dressing causes regression in wound condition, may D/C ordered dressing product/s and apply Normal Saline Moist Dressing daily until next [facility] / Other MD appointment. [place] of regression in wound condition at [phone]. o Please direct any NON-WOUND related issues/requests for orders to patient's Primary Care Physician Electronic Signature(s) Signed: 09/17/2018 3:14:44 PM By: Harold Barban Signed: 09/19/2018 6:29:07 AM By: Worthy Keeler PA-C Entered By: Harold Barban on 09/17/2018 13:59:41 [name], [name] ([id number]) -------------------------------------------------------------------------------- Problem List Details Patient Name: Oyen, Venessa C. Date of Service: 09/17/2018 1:45 PM Medical Record Number: [id number] Patient Account Number: [id number] Date of Birth/Sex: 10-May-1927 (85 y.o. F) Treating RN: Harold Barban Primary Care Provider:  Ria Bush Other Clinician: Referring Provider: Ria Bush Treating Provider/Extender: Melburn Hake, HOYT Weeks in Treatment: 25 Active Problems ICD-10 Evaluated Encounter Code Description Active Date Today Diagnosis I87.331 Chronic venous hypertension (idiopathic) with ulcer and 03/23/2018 No Yes inflammation of right lower extremity E11.622 Type 2 diabetes mellitus with other skin ulcer 03/23/2018 No Yes L97.812 Non-pressure chronic ulcer of other part of right lower leg 03/23/2018 No Yes with fat layer exposed L03.115 Cellulitis of right lower limb 03/23/2018 No Yes Inactive Problems Resolved Problems Electronic Signature(s) Signed: 09/19/2018 6:29:07 AM By: Worthy Keeler PA-C Entered By: Worthy Keeler on 09/17/2018 13:46:22 [name], [name] ([id number]) -------------------------------------------------------------------------------- Progress Note Details Patient Name: [name], [name] Date of Service: 09/17/2018 1:45 PM Medical Record Number: [id number] Patient Account Number: [id number] Date of Birth/Sex: 21-Mar-1928 (85 y.o. F) Treating RN: Harold Barban Primary Care Provider: Ria Bush Other Clinician: Referring Provider: Ria Bush Treating Provider/Extender: Melburn Hake, HOYT Weeks in Treatment: 25 Subjective Chief Complaint Information obtained from Patient RLE wounds History of Present Illness (HPI) Readmission: 03/23/18 on evaluation today patient presents for readmission entire clinic concerning issues that she has been having for the past couple of weeks in regard to her right lower extremity. The medial injury actually occurred when her dog tried to take a blanket from her and subsequently scratched her with his toenail. Subsequently the lateral injury was a wound where she dropped a knife in her  kitchen and hit the side of her leg. With that being said both have been open for some time and home health nurse that has been coming out  marked for her where the redness was several days ago. Nonetheless this has spread will be on the margin of what was marked. She was placed on Keflex which was started two days ago but at this point it does not seem to have helped with any improvement in the overall appearance of the wounds. No fevers, chills, nausea, or vomiting noted at this time. 04/02/18 Seen today for follow up and management of right lower extremity wounds. Right lateral wound with adherent slough. She is complaining of pain to touch which limited exam. Recently treated in the ED for cellulitis and wound culture positive for MRSA. Completed a dose of Vancomycin and transitioned to doxycycline by mouth. Erythema remains present with improvement since last visit. Right medial wound with scab present. No fevers, chills, nausea, or vomiting noted at this time. 04/10/18 on evaluation today patient appears to be doing a Schillaci better in regard to her right lateral lower [name]. The slough does seem to be coming off although it is slow from the wound bed. Fortunately there does not appear to be any evidence of infection at this time which is good news. With that being said I do believe that she is going to require longer treatment with the Santyl simply due to the fact that I'm not able to really debride the wound due to the pain that she is experiencing. 04/16/18 upon evaluation today patient's wound bed currently on the lateral lower extremity on the right appears to be doing a Strahan better at this point. We are utilizing sensible currently. Overall I'm very pleased with how things are progressing although there still is some work to do before this will be completely healed. No fevers, chills, nausea, or vomiting noted at this time. 04/23/18 on evaluation today patient appears to be doing about the same at this point maybe just a Alton bit smaller than last week's evaluation. She continues to have some slough buildup on the  surface of the wound and has very Jennette granulation unfortunately. This seems to be very slow moving I'm getting somewhat concerned about the possibility of her having decreased blood flow which is leading to her subsequently not being able to heal this area as quickly as she shut otherwise. She is currently utilizing Santyl. 05/01/18 on evaluation today patient appears to be doing somewhat poorly in regard to her lower extremity ulcer. At this point she's been tolerating the dressing changes to some degree without complication. With that being said she does note of the past several days that she has been having more pain unfortunately. On inspection it appears that she may have signs of infection which would account for the pain. 05/07/18 on evaluation today patient's wound actually appears to be doing better she's not having as much erythema in the right lower extremity as compared to last week. Fortunately there's no signs of infection this is good news as well. Overall very pleased with the progress that she has made up to this point. CLOTINE, HEINER ([id number]) 05/14/18 on evaluation today patient actually appears to be doing rather well in regard to her right lower extremity ulcer. She does not have erythema that was characterizing the cellulitis prior to initiation of antibiotic therapy. With that being said her wound still appears to be somewhat slow in healing I believe this may  be arterial in nature that is my concern at least. With that being said I do want her to see the vascular specialist as soon as possible we been trying to get this set up I'm not sure if she's not answering the phone or what exactly is going on here. 05/21/18 on evaluation today patient's wound actually appears to be doing a Husser better possibly some additional granulation compared to my last evaluation. With that being said she still continues to have a lot of pain especially when her legs are elevated such as  sitting in the chair here in the office. There does not appear to be any signs of infection which is good news. 05/28/18 on evaluation today patient appears to be doing a Baumgardner better in regard to her right lateral lower [name]. Again she was supposed to have an appointment with vascular unfortunately they attempted to call her three times and unfortunately were unable to get anything scheduled. That reason closed up for him. She tells me that if the phone is answered and she doesn't recognize what is that she will hang up as well those that she lives with. Obviously she's not gonna recognize somebody calling from the office that she has not previously spoken with. Nonetheless the patient states that she does not remember any fall. I think if we're gonna get her in with vascular specialist we're gonna have to try to make the appointment for her. 06/04/2018 Seen today for follow up and management of right lateral lower extremity wound. Adherent slough present over surface of wound. Scheduled to follow up for ABI/TBI on 06/06/2018 with vein and vascular specialist. She is hard of hearing. Wound is sensitive to touch. No signs or symptoms of infection. Denies any recent fevers, chills, or SOB. 06/11/18 on evaluation today patient actually appears to be doing very well in regard to her right lateral lower extremity ulcer. She's been tolerating the dressing changes without complication. Fortunately there's no evidence of infection. With that being said she did have her arterial studies which revealed that she had a very low ABI which was 0.67 on the right with a TBI of 0.32 and 0.65 on the left with a TBI of 0.16. I do believe she needs to see vascular and we are working on that referral as well. 06/21/18 on evaluation today patient's wound actually appears to be doing okay she does have an appointment with vascular coming up on the 24th. Nonetheless she's been having some issues that are more urinary in  nature right now that are causing trouble for her. Fortunately there's no signs of severe sepsis although she is running a low-grade temperature 99.9 on evaluation today. She also is having some issues right now with chills as well as urinary urgency and frequency. Again I feel like the city is more urinary and symptomology I do not believe she has anything upper respiratory which is good news. Nonetheless I think this may need to be checked out more quickly especially in light of the fact that she's having significant back pain at this point. 07/02/18 on evaluation today patient appears to be doing okay in regard to her ulcer on the right lower extremity. She's been tolerating the dressing changes without complication. Fortunately there does not appear to be any signs of infection which is good news. No fevers, chills, nausea, or vomiting noted at this time. 07/10/18 on evaluation today patient appears to be doing well in regard to her right lateral lower extremity ulcer  all things considering. She's been using this form this seems to be the one thing that she can really do on her own and she seems to be making some progress obviously help with this matter. I think the biggest issue limiting her feeling speed is her blood flow she canceled her original appointment with vascular her next one is not till May. She was worried about the Covid-19 Virus and having to see another physician. Nonetheless there's no signs of active infection at this time. 07/16/18 on evaluation today patient appears to be doing okay in regard to her right lateral lower extremity ulcer. She's been tolerating the dressing changes without complication. Fortunately there's no signs of active infection at this time. No fevers, chills, nausea, or vomiting noted at this time. 07/23/18 on evaluation today patient appears to be doing very well in regard to her right lateral load should be ulcer all things considering. She still again is  not seeing vascular until mid May in the meantime she's been utilizing the over-the-counter tripling about appointment which seems to be the only thing she's able to do routinely on her own without getting confused or frustrated. Overall it seems to be doing okay fortunately she's showing no signs of infection. 07/30/18 upon evaluation today patient actually appears to be doing about the same in regard to her wound in general. She is making very slow progress most likely again due to the issue with her blood flow she sees vascular towards the middle of news. In the meantime we've really been trying to just keep things in the control from the infection standpoint and allow for any healing that could take place up until appointment she can see vascular. [name], [name] ([id number]) 08/06/18 upon evaluation today patient's wound actually appears to be doing about the same although the wound bed may show [place] bit more granulation which is good news. Fortunately there's no signs of active infection at this time. No fevers, chills, nausea, or vomiting noted at this time. 08/14/18 on evaluation today patient actually appears to be doing about the same in regard to her lower extremity ulcer. Fortunately there's no signs of active infection at this time which is good news. Fortunately the patient has been tolerating the dressing changes without complication. No fevers, chills, nausea, or vomiting noted at this time. She does have her appointment with vascular next week. 08/20/18 on evaluation today patient actually appears to be doing a [place] better in my opinion with regard to her lower extremity ulcer. She's been tolerating the dressing changes without complication. Fortunately there's no signs of active infection at this time which is good news. She actually will be seen vascular tomorrow. 08/30/18 on evaluation today patient's wound actually appears to be showing some signs of improvement which is good  news. Fortunately there's no signs of infection. She does the the vascular doctor on June 4. 09/10/18 on evaluation today patient is status post having had an angiogram with Dr. Lucky Cowboy. Unfortunately she tells me that she still having a lot of discomfort although the wound bed itself seems to be doing better. She does have more swelling of the right lower extremity but nothing too significant. I think this is causing [place] bit of increased pain right now as well that is unfortunate. No fevers, chills, nausea, or vomiting noted at this time. 09/17/18 on evaluation today patient actually appears to be doing much better today compared to last week's evaluation. I'm very pleased in this regard. Fortunately there's no  signs of active infection which is also good news. Patient History Information obtained from Patient. Family History Diabetes - Father,Siblings,Child, Hypertension - Child, Lung Disease - Child, Stroke - Mother, No family history of Cancer, Heart Disease, Kidney Disease, Seizures, Thyroid Problems. Social History Former smoker, Marital Status - Single, Alcohol Use - Never, Drug Use - No History, Caffeine Use - Daily. Medical History Eyes Patient has history of Cataracts - removed Denies history of Glaucoma, Optic Neuritis Hematologic/Lymphatic Patient has history of Anemia Denies history of Hemophilia, Human Immunodeficiency Virus, Lymphedema, Sickle Cell Disease Respiratory Denies history of Aspiration, Asthma, Chronic Obstructive Pulmonary Disease (COPD), Pneumothorax, Sleep Apnea, Tuberculosis Cardiovascular Patient has history of Arrhythmia, Coronary Artery Disease, Hypertension, Myocardial Infarction Denies history of Angina, Congestive Heart Failure, Deep Vein Thrombosis, Hypotension, Peripheral Arterial Disease, Peripheral Venous Disease, Phlebitis Endocrine Patient has history of Type II Diabetes Denies history of Type I Diabetes Genitourinary Denies history of End Stage  Renal Disease Immunological Denies history of Lupus Erythematosus, Raynaud s, Scleroderma Integumentary (Skin) Denies history of History of Burn, History of pressure wounds Musculoskeletal [name], [name] ([id number]) Patient has history of Osteoarthritis Denies history of Gout, Rheumatoid Arthritis, Osteomyelitis Neurologic Denies history of Dementia, Neuropathy, Quadriplegia, Paraplegia, Seizure Disorder Oncologic Denies history of Received Chemotherapy, Received Radiation Medical And Surgical History Notes Constitutional Symptoms (General Health) High Blood Pressure, open heart surgery, gall stone, hysterectomy; Fluid pill, Anxiety Review of Systems (ROS) Constitutional Symptoms (General Health) Denies complaints or symptoms of Fatigue, Fever, Chills, Marked Weight Change. Respiratory Denies complaints or symptoms of Chronic or frequent coughs, Shortness of Breath. Cardiovascular Denies complaints or symptoms of Chest pain, LE edema. Psychiatric Denies complaints or symptoms of Anxiety, Claustrophobia. Objective Constitutional Well-nourished and well-hydrated in no acute distress. Vitals Time Taken: 1:41 PM, Height: 58 in, Weight: 140 lbs, BMI: 29.3, Temperature: 97.9 F, Pulse: 77 bpm, Respiratory Rate: 16 breaths/min, Blood Pressure: 136/76 mmHg. Respiratory normal breathing without difficulty. clear to auscultation bilaterally. Cardiovascular regular rate and rhythm with normal S1, S2. Psychiatric this patient is able to make decisions and demonstrates good insight into disease process. Alert and Oriented x 3. pleasant and cooperative. General Notes: Patient's wound bed still show signs of additional epithelialization there does not appear to be signs of active infection which is good news. Overall very pleased with how things seem to be progressing at this time. Integumentary (Hair, Skin) Wound #8 status is Open. Original cause of wound was Trauma. The wound is  located on the Right,Lateral Lower Leg. The wound measures 2cm length x 0.1cm width x 0.1cm depth; 0.157cm^2 area and 0.016cm^3 volume. There is Fat Layer (Subcutaneous Tissue) Exposed exposed. There is no tunneling or undermining noted. There is a medium amount of serous drainage noted. The wound margin is flat and intact. There is small (1-33%) pink granulation within the wound bed. There is a large (67-100%) amount of necrotic tissue within the wound bed including Adherent Slough. ARYELLA, BESECKER ([id number]) Assessment Active Problems ICD-10 Chronic venous hypertension (idiopathic) with ulcer and inflammation of right lower extremity Type 2 diabetes mellitus with other skin ulcer Non-pressure chronic ulcer of other part of right lower leg with fat layer exposed Cellulitis of right lower limb Plan Wound Cleansing: Wound #8 Right,Lateral Lower Leg: Clean wound with Normal Saline. Anesthetic (add to Medication List): Wound #8 Right,Lateral Lower Leg: Topical Lidocaine 4% cream applied to wound bed prior to debridement (In Clinic Only). Primary Wound Dressing: Wound #8 Right,Lateral Lower Leg: Silver Alginate Secondary Dressing: Wound #  8 Right,Lateral Lower Leg: Conform/Kerlix XtraSorb - Stretchy to secure Dressing Change Frequency: Wound #8 Right,Lateral Lower Leg: Change Dressing Monday, Wednesday, Friday Follow-up Appointments: Wound #8 Right,Lateral Lower Leg: Return Appointment in 1 week. Edema Control: Wound #8 Right,Lateral Lower Leg: Elevate legs to the level of the heart and pump ankles as often as possible Home Health: Wound #8 Right,Lateral Lower Leg: [place] for [place] Nurse may visit PRN to address patient s wound care needs. FACE TO FACE ENCOUNTER: MEDICARE and MEDICAID PATIENTS: I certify that this patient is under my care and that I had a face-to-face encounter that meets the physician face-to-face encounter requirements  with this patient on this date. The encounter with the patient was in whole or in part for the following MEDICAL CONDITION: (primary reason for [place]) MEDICAL NECESSITY: I certify, that based on my findings, NURSING services are a medically necessary home health service. HOME BOUND STATUS: I certify that my clinical findings support that this patient is homebound (i.e., Due to illness or injury, pt requires aid of supportive devices such as crutches, cane, wheelchairs, walkers, the use of special transportation or the assistance of another person to leave their place of residence. There is a normal inability to leave the home and doing so requires considerable and taxing effort. Other absences are for medical reasons / religious services and are infrequent or of short duration when for other reasons). If current dressing causes regression in wound condition, may D/C ordered dressing product/s and apply Normal Saline Moist Dressing daily until next [place] / Other MD appointment. [place] of regression in wound condition at [phone]. Please direct any NON-WOUND related issues/requests for orders to patient's Primary Care Physician [name], [name] ([id number]) My suggestion at this point is gonna be that we continue with the above wound care measures for the next week she seems to be doing well she's not having much drainage as she was having to be a following her angiogram. Her swelling was greatly improved. We will see were things stand at follow-up. Please see above for specific wound care orders. We will see patient for re-evaluation in 1 week(s) here in the clinic. If anything worsens or changes patient will contact our office for additional recommendations. Electronic Signature(s) Signed: 09/19/2018 6:29:07 AM By: Worthy Keeler PA-C Entered By: Worthy Keeler on 09/17/2018 23:47:29 [name], [name]  ([id number]) -------------------------------------------------------------------------------- ROS/PFSH Details Patient Name: Mcisaac, Jennamarie C. Date of Service: 09/17/2018 1:45 PM Medical Record Number: [id number] Patient Account Number: [id number] Date of Birth/Sex: 1927/06/18 (85 y.o. F) Treating RN: Harold Barban Primary Care Provider: Ria Bush Other Clinician: Referring Provider: Ria Bush Treating Provider/Extender: Melburn Hake, HOYT Weeks in Treatment: 25 Information Obtained From Patient Constitutional Symptoms (General Health) Complaints and Symptoms: Negative for: Fatigue; Fever; Chills; Marked Weight Change Medical History: Past Medical History Notes: High Blood Pressure, open heart surgery, gall stone, hysterectomy; Fluid pill, Anxiety Respiratory Complaints and Symptoms: Negative for: Chronic or frequent coughs; Shortness of Breath Medical History: Negative for: Aspiration; Asthma; Chronic Obstructive Pulmonary Disease (COPD); Pneumothorax; Sleep Apnea; Tuberculosis Cardiovascular Complaints and Symptoms: Negative for: Chest pain; LE edema Medical History: Positive for: Arrhythmia; Coronary Artery Disease; Hypertension; Myocardial Infarction Negative for: Angina; Congestive Heart Failure; Deep Vein Thrombosis; Hypotension; Peripheral Arterial Disease; Peripheral Venous Disease; Phlebitis Psychiatric Complaints and Symptoms: Negative for: Anxiety; Claustrophobia Eyes Medical History: Positive for: Cataracts - removed Negative for: Glaucoma; Optic Neuritis Hematologic/Lymphatic Medical History: Positive for: Anemia  Negative for: Hemophilia; Human Immunodeficiency Virus; Lymphedema; Sickle Cell Disease Endocrine [name], [name] ([id number]) Medical History: Positive for: Type II Diabetes Negative for: Type I Diabetes Time with diabetes: no idea Treated with: Oral agents Blood sugar tested every day: No Genitourinary Medical  History: Negative for: End Stage Renal Disease Immunological Medical History: Negative for: Lupus Erythematosus; Raynaudos; Scleroderma Integumentary (Skin) Medical History: Negative for: History of Burn; History of pressure wounds Musculoskeletal Medical History: Positive for: Osteoarthritis Negative for: Gout; Rheumatoid Arthritis; Osteomyelitis Neurologic Medical History: Negative for: Dementia; Neuropathy; Quadriplegia; Paraplegia; Seizure Disorder Oncologic Medical History: Negative for: Received Chemotherapy; Received Radiation HBO Extended History Items Eyes: Cataracts Immunizations Pneumococcal Vaccine: Received Pneumococcal Vaccination: Yes Implantable Devices No devices added Family and Social History Cancer: No; Diabetes: Yes - Father,Siblings,Child; Heart Disease: No; Hypertension: Yes - Child; Kidney Disease: No; Lung Disease: Yes - Child; Seizures: No; Stroke: Yes - Mother; Thyroid Problems: No; Former smoker; Marital Status - Single; Alcohol Use: Never; Drug Use: No History; Caffeine Use: Daily; Financial Concerns: No; Food, Clothing or Shelter Needs: No; Support System Lacking: No; Transportation Concerns: No Physician Affirmation I have reviewed and agree with the above information. TAMMYE, [name] ([id number]) Electronic Signature(s) Signed: 09/19/2018 6:29:07 AM By: Worthy Keeler PA-C Signed: 09/19/2018 4:39:36 PM By: Harold Barban Entered By: Worthy Keeler on 09/17/2018 23:47:04 [name], [name] ([id number]) -------------------------------------------------------------------------------- SuperBill Details Patient Name: Guthridge, Marlissa C. Date of Service: 09/17/2018 Medical Record Number: [id number] Patient Account Number: [id number] Date of Birth/Sex: 1927/11/21 (85 y.o. F) Treating RN: Harold Barban Primary Care Provider: Ria Bush Other Clinician: Referring Provider: Ria Bush Treating Provider/Extender: Melburn Hake, HOYT Weeks  in Treatment: 25 Diagnosis Coding ICD-10 Codes Code Description I87.331 Chronic venous hypertension (idiopathic) with ulcer and inflammation of right lower extremity E11.622 Type 2 diabetes mellitus with other skin ulcer L97.812 Non-pressure chronic ulcer of other part of right lower leg with fat layer exposed L03.115 Cellulitis of right lower limb Facility Procedures CPT4 Code: 11572620 Description: 99213 - WOUND CARE VISIT-LEV 3 EST PT Modifier: Quantity: 1 Physician Procedures CPT4: Description Modifier Quantity Code [id number] 99214 - WC PHYS LEVEL 4 - EST PT 1 ICD-10 Diagnosis Description I87.331 Chronic venous hypertension (idiopathic) with ulcer and inflammation of right lower extremity E11.622 Type 2 diabetes mellitus with  other skin ulcer L97.812 Non-pressure chronic ulcer of other part of right lower leg with fat layer exposed L03.115 Cellulitis of right lower limb Electronic Signature(s) Signed: 09/19/2018 6:29:07 AM By: Worthy Keeler PA-C Entered By: Worthy Keeler on 09/17/2018 23:47:48

## 2018-09-21 DIAGNOSIS — I87331 Chronic venous hypertension (idiopathic) with ulcer and inflammation of right lower extremity: Secondary | ICD-10-CM | POA: Diagnosis not present

## 2018-09-21 DIAGNOSIS — L97812 Non-pressure chronic ulcer of other part of right lower leg with fat layer exposed: Secondary | ICD-10-CM | POA: Diagnosis not present

## 2018-09-21 DIAGNOSIS — I2510 Atherosclerotic heart disease of native coronary artery without angina pectoris: Secondary | ICD-10-CM | POA: Diagnosis not present

## 2018-09-21 DIAGNOSIS — I10 Essential (primary) hypertension: Secondary | ICD-10-CM | POA: Diagnosis not present

## 2018-09-21 DIAGNOSIS — E119 Type 2 diabetes mellitus without complications: Secondary | ICD-10-CM | POA: Diagnosis not present

## 2018-09-21 DIAGNOSIS — L03115 Cellulitis of right lower limb: Secondary | ICD-10-CM | POA: Diagnosis not present

## 2018-09-24 ENCOUNTER — Encounter: Payer: Medicare Other | Admitting: Physician Assistant

## 2018-09-24 ENCOUNTER — Other Ambulatory Visit: Payer: Self-pay

## 2018-09-24 DIAGNOSIS — E11621 Type 2 diabetes mellitus with foot ulcer: Secondary | ICD-10-CM | POA: Diagnosis not present

## 2018-09-24 DIAGNOSIS — E11622 Type 2 diabetes mellitus with other skin ulcer: Secondary | ICD-10-CM | POA: Diagnosis not present

## 2018-09-24 DIAGNOSIS — I2510 Atherosclerotic heart disease of native coronary artery without angina pectoris: Secondary | ICD-10-CM | POA: Diagnosis not present

## 2018-09-24 DIAGNOSIS — L03115 Cellulitis of right lower limb: Secondary | ICD-10-CM | POA: Diagnosis not present

## 2018-09-24 DIAGNOSIS — L97812 Non-pressure chronic ulcer of other part of right lower leg with fat layer exposed: Secondary | ICD-10-CM | POA: Diagnosis not present

## 2018-09-24 DIAGNOSIS — I252 Old myocardial infarction: Secondary | ICD-10-CM | POA: Diagnosis not present

## 2018-09-25 NOTE — Progress Notes (Signed)
NATORI, GUDINO ([id number]) Visit Report for 09/24/2018 Chief Complaint Document Details Patient Name: [name], [name] Date of Service: 09/24/2018 12:45 PM Medical Record Number: [id number] Patient Account Number: [id number] Date of Birth/Sex: [birth year] (85 y.o. F) Treating RN: Harold Barban Primary Care Provider: Ria Bush Other Clinician: Referring Provider: Ria Bush Treating Provider/Extender: Melburn Hake, Riely Baskett Weeks in Treatment: 26 Information Obtained from: Patient Chief Complaint RLE wounds Electronic Signature(s) Signed: 09/24/2018 4:05:21 PM By: Worthy Keeler PA-C Entered By: Worthy Keeler on 09/24/2018 12:47:02 [name], [name] ([id number]) -------------------------------------------------------------------------------- Debridement Details Patient Name: [name], [name] Date of Service: 09/24/2018 12:45 PM Medical Record Number: [id number] Patient Account Number: [id number] Date of Birth/Sex: [birth year] (85 y.o. F) Treating RN: Harold Barban Primary Care Provider: Ria Bush Other Clinician: Referring Provider: Ria Bush Treating Provider/Extender: Melburn Hake, Inga Noller Weeks in Treatment: 26 Debridement Performed for Wound #8 Right,Lateral Lower Leg Assessment: Performed By: Physician STONE III, Ayven Pheasant E., PA-C Debridement Type: Debridement Severity of Tissue Pre Fat layer exposed Debridement: Level of Consciousness (Pre- Awake and Alert procedure): Pre-procedure Verification/Time Yes - 12:50 Out Taken: Start Time: 12:50 Pain Control: Lidocaine Total Area Debrided (L x W): 1 (cm) x 0.4 (cm) = 0.4 (cm) Tissue and other material Viable, Non-Viable, Slough, Subcutaneous, Fibrin/Exudate, Slough debrided: Level: Skin/Subcutaneous Tissue Debridement Description: Excisional Instrument: Curette Bleeding: None End Time: 12:55 Procedural Pain: 0 Post Procedural Pain: 0 Response to Treatment: Procedure was tolerated well Level  of Consciousness Awake and Alert (Post-procedure): Post Debridement Measurements of Total Wound Length: (cm) 1 Width: (cm) 0.4 Depth: (cm) 0.1 Volume: (cm) 0.031 Character of Wound/Ulcer Post Debridement: Improved Severity of Tissue Post Debridement: Fat layer exposed Post Procedure Diagnosis Same as Pre-procedure Electronic Signature(s) Signed: 09/24/2018 3:57:45 PM By: Harold Barban Signed: 09/24/2018 4:05:21 PM By: Worthy Keeler PA-C Entered By: Harold Barban on 09/24/2018 12:51:39 [name], [name] ([id number]) -------------------------------------------------------------------------------- HPI Details Patient Name: [name], [name] Date of Service: 09/24/2018 12:45 PM Medical Record Number: [id number] Patient Account Number: [id number] Date of Birth/Sex: Aug 19, 1927 (85 y.o. F) Treating RN: Harold Barban Primary Care Provider: Ria Bush Other Clinician: Referring Provider: Ria Bush Treating Provider/Extender: Melburn Hake, Chrisoula Zegarra Weeks in Treatment: 26 History of Present Illness HPI Description: Readmission: 03/23/18 on evaluation today patient presents for readmission entire clinic concerning issues that she has been having for the past couple of weeks in regard to her right lower extremity. The medial injury actually occurred when her dog tried to take a blanket from her and subsequently scratched her with his toenail. Subsequently the lateral injury was a wound where she dropped a knife in her kitchen and hit the side of her leg. With that being said both have been open for some time and home health nurse that has been coming out marked for her where the redness was several days ago. Nonetheless this has spread will be on the margin of what was marked. She was placed on Keflex which was started two days ago but at this point it does not seem to have helped with any improvement in the overall appearance of the wounds. No fevers, chills, nausea, or vomiting  noted at this time. 04/02/18 Seen today for follow up and management of right lower extremity wounds. Right lateral wound with adherent slough. She is complaining of pain to touch which limited exam. Recently treated in the ED for cellulitis and wound culture positive for MRSA. Completed a dose of Vancomycin and transitioned to doxycycline by mouth. Erythema remains present with improvement  since last visit. Right medial wound with scab present. No fevers, chills, nausea, or vomiting noted at this time. 04/10/18 on evaluation today patient appears to be doing a Cuffie better in regard to her right lateral lower [name]. The slough does seem to be coming off although it is slow from the wound bed. Fortunately there does not appear to be any evidence of infection at this time which is good news. With that being said I do believe that she is going to require longer treatment with the Santyl simply due to the fact that I'm not able to really debride the wound due to the pain that she is experiencing. 04/16/18 upon evaluation today patient's wound bed currently on the lateral lower extremity on the right appears to be doing a Spearing better at this point. We are utilizing sensible currently. Overall I'm very pleased with how things are progressing although there still is some work to do before this will be completely healed. No fevers, chills, nausea, or vomiting noted at this time. 04/23/18 on evaluation today patient appears to be doing about the same at this point maybe just a Hasler bit smaller than last week's evaluation. She continues to have some slough buildup on the surface of the wound and has very Stickley granulation unfortunately. This seems to be very slow moving I'm getting somewhat concerned about the possibility of her having decreased blood flow which is leading to her subsequently not being able to heal this area as quickly as she shut otherwise. She is currently utilizing  Santyl. 05/01/18 on evaluation today patient appears to be doing somewhat poorly in regard to her lower extremity ulcer. At this point she's been tolerating the dressing changes to some degree without complication. With that being said she does note of the past several days that she has been having more pain unfortunately. On inspection it appears that she may have signs of infection which would account for the pain. 05/07/18 on evaluation today patient's wound actually appears to be doing better she's not having as much erythema in the right lower extremity as compared to last week. Fortunately there's no signs of infection this is good news as well. Overall very pleased with the progress that she has made up to this point. 05/14/18 on evaluation today patient actually appears to be doing rather well in regard to her right lower extremity ulcer. She does not have erythema that was characterizing the cellulitis prior to initiation of antibiotic therapy. With that being said her wound still appears to be somewhat slow in healing I believe this may be arterial in nature that is my concern at least. With that being said I do want her to see the vascular specialist as soon as possible we been trying to get this set up I'm not sure if she's not answering the phone or what exactly is going on here. [name], [name] ([id number]) 05/21/18 on evaluation today patient's wound actually appears to be doing a Mierzejewski better possibly some additional granulation compared to my last evaluation. With that being said she still continues to have a lot of pain especially when her legs are elevated such as sitting in the chair here in the office. There does not appear to be any signs of infection which is good news. 05/28/18 on evaluation today patient appears to be doing a Foland better in regard to her right lateral lower [name]. Again she was supposed to have an appointment with vascular unfortunately they  attempted  to call her three times and unfortunately were unable to get anything scheduled. That reason closed up for him. She tells me that if the phone is answered and she doesn't recognize what is that she will hang up as well those that she lives with. Obviously she's not gonna recognize somebody calling from the office that she has not previously spoken with. Nonetheless the patient states that she does not remember any fall. I think if we're gonna get her in with vascular specialist we're gonna have to try to make the appointment for her. 06/04/2018 Seen today for follow up and management of right lateral lower extremity wound. Adherent slough present over surface of wound. Scheduled to follow up for ABI/TBI on 06/06/2018 with vein and vascular specialist. She is hard of hearing. Wound is sensitive to touch. No signs or symptoms of infection. Denies any recent fevers, chills, or SOB. 06/11/18 on evaluation today patient actually appears to be doing very well in regard to her right lateral lower extremity ulcer. She's been tolerating the dressing changes without complication. Fortunately there's no evidence of infection. With that being said she did have her arterial studies which revealed that she had a very low ABI which was 0.67 on the right with a TBI of 0.32 and 0.65 on the left with a TBI of 0.16. I do believe she needs to see vascular and we are working on that referral as well. 06/21/18 on evaluation today patient's wound actually appears to be doing okay she does have an appointment with vascular coming up on the 24th. Nonetheless she's been having some issues that are more urinary in nature right now that are causing trouble for her. Fortunately there's no signs of severe sepsis although she is running a low-grade temperature 99.9 on evaluation today. She also is having some issues right now with chills as well as urinary urgency and frequency. Again I feel like the city is more urinary and  symptomology I do not believe she has anything upper respiratory which is good news. Nonetheless I think this may need to be checked out more quickly especially in light of the fact that she's having significant back pain at this point. 07/02/18 on evaluation today patient appears to be doing okay in regard to her ulcer on the right lower extremity. She's been tolerating the dressing changes without complication. Fortunately there does not appear to be any signs of infection which is good news. No fevers, chills, nausea, or vomiting noted at this time. 07/10/18 on evaluation today patient appears to be doing well in regard to her right lateral lower extremity ulcer all things considering. She's been using this form this seems to be the one thing that she can really do on her own and she seems to be making some progress obviously help with this matter. I think the biggest issue limiting her feeling speed is her blood flow she canceled her original appointment with vascular her next one is not till May. She was worried about the Covid-19 Virus and having to see another physician. Nonetheless there's no signs of active infection at this time. 07/16/18 on evaluation today patient appears to be doing okay in regard to her right lateral lower extremity ulcer. She's been tolerating the dressing changes without complication. Fortunately there's no signs of active infection at this time. No fevers, chills, nausea, or vomiting noted at this time. 07/23/18 on evaluation today patient appears to be doing very well in regard to her right lateral load should  be ulcer all things considering. She still again is not seeing vascular until mid May in the meantime she's been utilizing the over-the-counter tripling about appointment which seems to be the only thing she's able to do routinely on her own without getting confused or frustrated. Overall it seems to be doing okay fortunately she's showing no signs of  infection. 07/30/18 upon evaluation today patient actually appears to be doing about the same in regard to her wound in general. She is making very slow progress most likely again due to the issue with her blood flow she sees vascular towards the middle of news. In the meantime we've really been trying to just keep things in the control from the infection standpoint and allow for any healing that could take place up until appointment she can see vascular. 08/06/18 upon evaluation today patient's wound actually appears to be doing about the same although the wound bed may show Lage bit more granulation which is good news. Fortunately there's no signs of active infection at this time. No fevers, chills, nausea, or vomiting noted at this time. 08/14/18 on evaluation today patient actually appears to be doing about the same in regard to her lower extremity ulcer. [name], [name] ([id number]) Fortunately there's no signs of active infection at this time which is good news. Fortunately the patient has been tolerating the dressing changes without complication. No fevers, chills, nausea, or vomiting noted at this time. She does have her appointment with vascular next week. 08/20/18 on evaluation today patient actually appears to be doing a Muehl better in my opinion with regard to her lower extremity ulcer. She's been tolerating the dressing changes without complication. Fortunately there's no signs of active infection at this time which is good news. She actually will be seen vascular tomorrow. 08/30/18 on evaluation today patient's wound actually appears to be showing some signs of improvement which is good news. Fortunately there's no signs of infection. She does the the vascular doctor on June 4. 09/10/18 on evaluation today patient is status post having had an angiogram with Dr. Lucky Cowboy. Unfortunately she tells me that she still having a lot of discomfort although the wound bed itself seems to be doing  better. She does have more swelling of the right lower extremity but nothing too significant. I think this is causing Funez bit of increased pain right now as well that is unfortunate. No fevers, chills, nausea, or vomiting noted at this time. 09/17/18 on evaluation today patient actually appears to be doing much better today compared to last week's evaluation. I'm very pleased in this regard. Fortunately there's no signs of active infection which is also good news. 09/24/18 on evaluation today patient appears to be doing well in regard to the wounds on the right lateral lower extremity. Since her vascular intervention I do feel like she's made excellent progress. Fortunately there's no signs of active infection at this time. No fevers, chills, nausea, or vomiting noted at this time. Electronic Signature(s) Signed: 09/24/2018 4:05:21 PM By: Worthy Keeler PA-C Entered By: Worthy Keeler on 09/24/2018 15:47:57 [name], [name] ([id number]) -------------------------------------------------------------------------------- Physical Exam Details Patient Name: [name], [name] Date of Service: 09/24/2018 12:45 PM Medical Record Number: [id number] Patient Account Number: [id number] Date of Birth/Sex: February 06, 1928 (85 y.o. F) Treating RN: Harold Barban Primary Care Provider: Ria Bush Other Clinician: Referring Provider: Ria Bush Treating Provider/Extender: STONE III, Abijah Roussel Weeks in Treatment: 71 Constitutional Well-nourished and well-hydrated in no acute distress. Respiratory normal breathing without difficulty.  Psychiatric this patient is able to make decisions and demonstrates good insight into disease process. Alert and Oriented x 3. pleasant and cooperative. Notes Patient's wound bed currently do not require me significant sharp debridement although there was some Slough and eschar that did need to be removed during the evaluation today actually did perform debridement  which he tolerated without too much pain although when it did start to bother her in regard to the distal wound. Actually did discontinue debridement at that point. Overall I do feel like she's making good progress it's just moving slowly. Electronic Signature(s) Signed: 09/24/2018 4:05:21 PM By: Worthy Keeler PA-C Entered By: Worthy Keeler on 09/24/2018 15:48:44 [name], [name] ([id number]) -------------------------------------------------------------------------------- Physician Orders Details Patient Name: [name], [name] Date of Service: 09/24/2018 12:45 PM Medical Record Number: [id number] Patient Account Number: [id number] Date of Birth/Sex: 1927/07/13 (85 y.o. F) Treating RN: Harold Barban Primary Care Provider: Ria Bush Other Clinician: Referring Provider: Ria Bush Treating Provider/Extender: Melburn Hake, Art Levan Weeks in Treatment: 26 Verbal / Phone Orders: No Diagnosis Coding ICD-10 Coding Code Description I87.331 Chronic venous hypertension (idiopathic) with ulcer and inflammation of right lower extremity E11.622 Type 2 diabetes mellitus with other skin ulcer L97.812 Non-pressure chronic ulcer of other part of right lower leg with fat layer exposed L03.115 Cellulitis of right lower limb Wound Cleansing Wound #8 Right,Lateral Lower Leg o Clean wound with Normal Saline. Anesthetic (add to Medication List) Wound #8 Right,Lateral Lower Leg o Topical Lidocaine 4% cream applied to wound bed prior to debridement (In Clinic Only). Primary Wound Dressing Wound #8 Right,Lateral Lower Leg o Silver Alginate Secondary Dressing Wound #8 Right,Lateral Lower Leg o Conform/Kerlix o XtraSorb - Stretchy to secure Dressing Change Frequency Wound #8 Right,Lateral Lower Leg o Change Dressing Monday, Wednesday, Friday Follow-up Appointments Wound #8 Right,Lateral Lower Leg o Return Appointment in 1 week. Edema Control Wound #8 Right,Lateral Lower  Leg o Elevate legs to the level of the heart and pump ankles as often as possible Home Health Wound #8 [facility] KENNAH, HEHR ([id number]) o FACE TO FACE ENCOUNTER: MEDICARE and MEDICAID PATIENTS: I certify that this patient is under my care and that I had a face-to-face encounter that meets the physician face-to-face encounter requirements with this patient on this date. The encounter with the patient was in whole or in part for the following MEDICAL CONDITION: (primary reason for Green) MEDICAL NECESSITY: I certify, that based on my findings, NURSING services are a medically necessary home health service. HOME BOUND STATUS: I certify that my clinical findings support that this patient is homebound (i.e., Due to illness or injury, pt requires aid of supportive devices such as crutches, cane, wheelchairs, walkers, the use of special transportation or the assistance of another person to leave their place of residence. There is a normal inability to leave the home and doing so requires considerable and taxing effort. Other absences are for medical reasons / religious services and are infrequent or of short duration when for other reasons). Electronic Signature(s) Signed: 09/24/2018 3:57:45 PM By: Harold Barban Signed: 09/24/2018 4:05:21 PM By: Worthy Keeler PA-C Entered By: Harold Barban on 09/24/2018 12:53:27 [name], [name] ([id number]) -------------------------------------------------------------------------------- Problem List Details Patient Name: Statzer, Dennis C. Date of Service: 09/24/2018 12:45 PM Medical Record Number: [id number] Patient Account Number: [id number] Date of Birth/Sex: 27-Sep-1927 (85 y.o. F) Treating RN: Harold Barban Primary Care Provider: Ria Bush Other Clinician: Referring Provider: Ria Bush  Treating Provider/Extender: Melburn Hake, Jayke Caul Weeks in Treatment:  26 Active Problems ICD-10 Evaluated Encounter Code Description Active Date Today Diagnosis I87.331 Chronic venous hypertension (idiopathic) with ulcer and 03/23/2018 No Yes inflammation of right lower extremity E11.622 Type 2 diabetes mellitus with other skin ulcer 03/23/2018 No Yes L97.812 Non-pressure chronic ulcer of other part of right lower leg 03/23/2018 No Yes with fat layer exposed L03.115 Cellulitis of right lower limb 03/23/2018 No Yes Inactive Problems Resolved Problems Electronic Signature(s) Signed: 09/24/2018 4:05:21 PM By: Worthy Keeler PA-C Entered By: Worthy Keeler on 09/24/2018 12:46:51 Rembold, [name] ([id number]) -------------------------------------------------------------------------------- Progress Note Details Patient Name: Hunzeker, Jeanifer C. Date of Service: 09/24/2018 12:45 PM Medical Record Number: [id number] Patient Account Number: [id number] Date of Birth/Sex: 09-10-1927 (85 y.o. F) Treating RN: Harold Barban Primary Care Provider: Ria Bush Other Clinician: Referring Provider: Ria Bush Treating Provider/Extender: Melburn Hake, Dontavian Marchi Weeks in Treatment: 26 Subjective Chief Complaint Information obtained from Patient RLE wounds History of Present Illness (HPI) Readmission: 03/23/18 on evaluation today patient presents for readmission entire clinic concerning issues that she has been having for the past couple of weeks in regard to her right lower extremity. The medial injury actually occurred when her dog tried to take a blanket from her and subsequently scratched her with his toenail. Subsequently the lateral injury was a wound where she dropped a knife in her kitchen and hit the side of her leg. With that being said both have been open for some time and home health nurse that has been coming out marked for her where the redness was several days ago. Nonetheless this has spread will be on the margin of what was marked. She was  placed on Keflex which was started two days ago but at this point it does not seem to have helped with any improvement in the overall appearance of the wounds. No fevers, chills, nausea, or vomiting noted at this time. 04/02/18 Seen today for follow up and management of right lower extremity wounds. Right lateral wound with adherent slough. She is complaining of pain to touch which limited exam. Recently treated in the ED for cellulitis and wound culture positive for MRSA. Completed a dose of Vancomycin and transitioned to doxycycline by mouth. Erythema remains present with improvement since last visit. Right medial wound with scab present. No fevers, chills, nausea, or vomiting noted at this time. 04/10/18 on evaluation today patient appears to be doing a Cressman better in regard to her right lateral lower [name]. The slough does seem to be coming off although it is slow from the wound bed. Fortunately there does not appear to be any evidence of infection at this time which is good news. With that being said I do believe that she is going to require longer treatment with the Santyl simply due to the fact that I'm not able to really debride the wound due to the pain that she is experiencing. 04/16/18 upon evaluation today patient's wound bed currently on the lateral lower extremity on the right appears to be doing a Amico better at this point. We are utilizing sensible currently. Overall I'm very pleased with how things are progressing although there still is some work to do before this will be completely healed. No fevers, chills, nausea, or vomiting noted at this time. 04/23/18 on evaluation today patient appears to be doing about the same at this point maybe just a Vantol bit smaller than last week's evaluation. She continues to have some slough buildup  on the surface of the wound and has very Malecha granulation unfortunately. This seems to be very slow moving I'm getting somewhat concerned  about the possibility of her having decreased blood flow which is leading to her subsequently not being able to heal this area as quickly as she shut otherwise. She is currently utilizing Santyl. 05/01/18 on evaluation today patient appears to be doing somewhat poorly in regard to her lower extremity ulcer. At this point she's been tolerating the dressing changes to some degree without complication. With that being said she does note of the past several days that she has been having more pain unfortunately. On inspection it appears that she may have signs of infection which would account for the pain. 05/07/18 on evaluation today patient's wound actually appears to be doing better she's not having as much erythema in the right lower extremity as compared to last week. Fortunately there's no signs of infection this is good news as well. Overall very pleased with the progress that she has made up to this point. DANYLA, WATTLEY ([id number]) 05/14/18 on evaluation today patient actually appears to be doing rather well in regard to her right lower extremity ulcer. She does not have erythema that was characterizing the cellulitis prior to initiation of antibiotic therapy. With that being said her wound still appears to be somewhat slow in healing I believe this may be arterial in nature that is my concern at least. With that being said I do want her to see the vascular specialist as soon as possible we been trying to get this set up I'm not sure if she's not answering the phone or what exactly is going on here. 05/21/18 on evaluation today patient's wound actually appears to be doing a Rodak better possibly some additional granulation compared to my last evaluation. With that being said she still continues to have a lot of pain especially when her legs are elevated such as sitting in the chair here in the office. There does not appear to be any signs of infection which is good news. 05/28/18 on evaluation  today patient appears to be doing a Garden better in regard to her right lateral lower [name]. Again she was supposed to have an appointment with vascular unfortunately they attempted to call her three times and unfortunately were unable to get anything scheduled. That reason closed up for him. She tells me that if the phone is answered and she doesn't recognize what is that she will hang up as well those that she lives with. Obviously she's not gonna recognize somebody calling from the office that she has not previously spoken with. Nonetheless the patient states that she does not remember any fall. I think if we're gonna get her in with vascular specialist we're gonna have to try to make the appointment for her. 06/04/2018 Seen today for follow up and management of right lateral lower extremity wound. Adherent slough present over surface of wound. Scheduled to follow up for ABI/TBI on 06/06/2018 with vein and vascular specialist. She is hard of hearing. Wound is sensitive to touch. No signs or symptoms of infection. Denies any recent fevers, chills, or SOB. 06/11/18 on evaluation today patient actually appears to be doing very well in regard to her right lateral lower extremity ulcer. She's been tolerating the dressing changes without complication. Fortunately there's no evidence of infection. With that being said she did have her arterial studies which revealed that she had a very low ABI which was 0.67  on the right with a TBI of 0.32 and 0.65 on the left with a TBI of 0.16. I do believe she needs to see vascular and we are working on that referral as well. 06/21/18 on evaluation today patient's wound actually appears to be doing okay she does have an appointment with vascular coming up on the 24th. Nonetheless she's been having some issues that are more urinary in nature right now that are causing trouble for her. Fortunately there's no signs of severe sepsis although she is running a low-grade  temperature 99.9 on evaluation today. She also is having some issues right now with chills as well as urinary urgency and frequency. Again I feel like the city is more urinary and symptomology I do not believe she has anything upper respiratory which is good news. Nonetheless I think this may need to be checked out more quickly especially in light of the fact that she's having significant back pain at this point. 07/02/18 on evaluation today patient appears to be doing okay in regard to her ulcer on the right lower extremity. She's been tolerating the dressing changes without complication. Fortunately there does not appear to be any signs of infection which is good news. No fevers, chills, nausea, or vomiting noted at this time. 07/10/18 on evaluation today patient appears to be doing well in regard to her right lateral lower extremity ulcer all things considering. She's been using this form this seems to be the one thing that she can really do on her own and she seems to be making some progress obviously help with this matter. I think the biggest issue limiting her feeling speed is her blood flow she canceled her original appointment with vascular her next one is not till May. She was worried about the Covid-19 Virus and having to see another physician. Nonetheless there's no signs of active infection at this time. 07/16/18 on evaluation today patient appears to be doing okay in regard to her right lateral lower extremity ulcer. She's been tolerating the dressing changes without complication. Fortunately there's no signs of active infection at this time. No fevers, chills, nausea, or vomiting noted at this time. 07/23/18 on evaluation today patient appears to be doing very well in regard to her right lateral load should be ulcer all things considering. She still again is not seeing vascular until mid May in the meantime she's been utilizing the over-the-counter tripling about appointment which seems to  be the only thing she's able to do routinely on her own without getting confused or frustrated. Overall it seems to be doing okay fortunately she's showing no signs of infection. 07/30/18 upon evaluation today patient actually appears to be doing about the same in regard to her wound in general. She is making very slow progress most likely again due to the issue with her blood flow she sees vascular towards the middle of news. In the meantime we've really been trying to just keep things in the control from the infection standpoint and allow for any healing that could take place up until appointment she can see vascular. JOHNASIA, LIESE ([id number]) 08/06/18 upon evaluation today patient's wound actually appears to be doing about the same although the wound bed may show Bischoff bit more granulation which is good news. Fortunately there's no signs of active infection at this time. No fevers, chills, nausea, or vomiting noted at this time. 08/14/18 on evaluation today patient actually appears to be doing about the same in regard to her lower  extremity ulcer. Fortunately there's no signs of active infection at this time which is good news. Fortunately the patient has been tolerating the dressing changes without complication. No fevers, chills, nausea, or vomiting noted at this time. She does have her appointment with vascular next week. 08/20/18 on evaluation today patient actually appears to be doing a Majka better in my opinion with regard to her lower extremity ulcer. She's been tolerating the dressing changes without complication. Fortunately there's no signs of active infection at this time which is good news. She actually will be seen vascular tomorrow. 08/30/18 on evaluation today patient's wound actually appears to be showing some signs of improvement which is good news. Fortunately there's no signs of infection. She does the the vascular doctor on June 4. 09/10/18 on evaluation today patient is  status post having had an angiogram with Dr. Lucky Cowboy. Unfortunately she tells me that she still having a lot of discomfort although the wound bed itself seems to be doing better. She does have more swelling of the right lower extremity but nothing too significant. I think this is causing Muckle bit of increased pain right now as well that is unfortunate. No fevers, chills, nausea, or vomiting noted at this time. 09/17/18 on evaluation today patient actually appears to be doing much better today compared to last week's evaluation. I'm very pleased in this regard. Fortunately there's no signs of active infection which is also good news. 09/24/18 on evaluation today patient appears to be doing well in regard to the wounds on the right lateral lower extremity. Since her vascular intervention I do feel like she's made excellent progress. Fortunately there's no signs of active infection at this time. No fevers, chills, nausea, or vomiting noted at this time. Patient History Information obtained from Patient. Family History Diabetes - Father,Siblings,Child, Hypertension - Child, Lung Disease - Child, Stroke - Mother, No family history of Cancer, Heart Disease, Kidney Disease, Seizures, Thyroid Problems. Social History Former smoker, Marital Status - Single, Alcohol Use - Never, Drug Use - No History, Caffeine Use - Daily. Medical History Eyes Patient has history of Cataracts - removed Denies history of Glaucoma, Optic Neuritis Hematologic/Lymphatic Patient has history of Anemia Denies history of Hemophilia, Human Immunodeficiency Virus, Lymphedema, Sickle Cell Disease Respiratory Denies history of Aspiration, Asthma, Chronic Obstructive Pulmonary Disease (COPD), Pneumothorax, Sleep Apnea, Tuberculosis Cardiovascular Patient has history of Arrhythmia, Coronary Artery Disease, Hypertension, Myocardial Infarction Denies history of Angina, Congestive Heart Failure, Deep Vein Thrombosis, Hypotension,  Peripheral Arterial Disease, Peripheral Venous Disease, Phlebitis Endocrine Patient has history of Type II Diabetes Denies history of Type I Diabetes Genitourinary Denies history of End Stage Renal Disease Immunological Borden, Verneda C. ([id number]) Denies history of Lupus Erythematosus, Raynaud s, Scleroderma Integumentary (Skin) Denies history of History of Burn, History of pressure wounds Musculoskeletal Patient has history of Osteoarthritis Denies history of Gout, Rheumatoid Arthritis, Osteomyelitis Neurologic Denies history of Dementia, Neuropathy, Quadriplegia, Paraplegia, Seizure Disorder Oncologic Denies history of Received Chemotherapy, Received Radiation Medical And Surgical History Notes Constitutional Symptoms (General Health) High Blood Pressure, open heart surgery, gall stone, hysterectomy; Fluid pill, Anxiety Review of Systems (ROS) Constitutional Symptoms (General Health) Denies complaints or symptoms of Fatigue, Fever, Chills, Marked Weight Change. Respiratory Denies complaints or symptoms of Chronic or frequent coughs, Shortness of Breath. Cardiovascular Complains or has symptoms of LE edema. Denies complaints or symptoms of Chest pain. Psychiatric Denies complaints or symptoms of Anxiety, Claustrophobia. Objective Constitutional Well-nourished and well-hydrated in no acute distress. Vitals Time Taken: 12:42  PM, Height: 58 in, Weight: 140 lbs, BMI: 29.3, Temperature: 98.5 F, Pulse: 75 bpm, Respiratory Rate: 16 breaths/min, Blood Pressure: 109/61 mmHg. Respiratory normal breathing without difficulty. Psychiatric this patient is able to make decisions and demonstrates good insight into disease process. Alert and Oriented x 3. pleasant and cooperative. General Notes: Patient's wound bed currently do not require me significant sharp debridement although there was some Slough and eschar that did need to be removed during the evaluation today actually did  perform debridement which he tolerated without too much pain although when it did start to bother her in regard to the distal wound. Actually did discontinue debridement at that point. Overall I do feel like she's making good progress it's just moving slowly. Integumentary (Hair, Skin) Wound #8 status is Open. Original cause of wound was Trauma. The wound is located on the Right,Lateral Lower Leg. The wound measures 1cm length x 0.4cm width x 0.1cm depth; 0.314cm^2 area and 0.031cm^3 volume. There is Fat Layer Brahmbhatt, Umi C. ([id number]) (Subcutaneous Tissue) Exposed exposed. There is no tunneling or undermining noted. There is a medium amount of serous drainage noted. The wound margin is flat and intact. There is small (1-33%) pink granulation within the wound bed. There is a large (67-100%) amount of necrotic tissue within the wound bed including Adherent Slough. Assessment Active Problems ICD-10 Chronic venous hypertension (idiopathic) with ulcer and inflammation of right lower extremity Type 2 diabetes mellitus with other skin ulcer Non-pressure chronic ulcer of other part of right lower leg with fat layer exposed Cellulitis of right lower limb Procedures Wound #8 Pre-procedure diagnosis of Wound #8 is a Diabetic Wound/Ulcer of the Lower Extremity located on the Right,Lateral Lower Leg .Severity of Tissue Pre Debridement is: Fat layer exposed. There was a Excisional Skin/Subcutaneous Tissue Debridement with a total area of 0.4 sq cm performed by STONE III, Carlyn Mullenbach E., PA-C. With the following instrument(s): Curette to remove Viable and Non-Viable tissue/material. Material removed includes Subcutaneous Tissue, Slough, and Fibrin/Exudate after achieving pain control using Lidocaine. No specimens were taken. A time out was conducted at 12:50, prior to the start of the procedure. There was no bleeding. The procedure was tolerated well with a pain level of 0 throughout and a pain level of 0  following the procedure. Post Debridement Measurements: 1cm length x 0.4cm width x 0.1cm depth; 0.031cm^3 volume. Character of Wound/Ulcer Post Debridement is improved. Severity of Tissue Post Debridement is: Fat layer exposed. Post procedure Diagnosis Wound #8: Same as Pre-Procedure Plan Wound Cleansing: Wound #8 Right,Lateral Lower Leg: Clean wound with Normal Saline. Anesthetic (add to Medication List): Wound #8 Right,Lateral Lower Leg: Topical Lidocaine 4% cream applied to wound bed prior to debridement (In Clinic Only). Primary Wound Dressing: Wound #8 Right,Lateral Lower Leg: Silver Alginate Secondary Dressing: Wound #8 Right,Lateral Lower Leg: Conform/Kerlix XtraSorb - Stretchy to secure Dressing Change Frequency: Wound #8 Right,Lateral Lower Leg: SHALIMAR, MCCLAIN ([id number]) Change Dressing Monday, Wednesday, Friday Follow-up Appointments: Wound #8 Right,Lateral Lower Leg: Return Appointment in 1 week. Edema Control: Wound #8 Right,Lateral Lower Leg: Elevate legs to the level of the heart and pump ankles as often as possible Home Health: Wound #8 Right,Lateral Lower Leg: [facility] FACE TO FACE ENCOUNTER: MEDICARE and MEDICAID PATIENTS: I certify that this patient is under my care and that I had a face-to-face encounter that meets the physician face-to-face encounter requirements with this patient on this date. The encounter with the patient was in whole or in  part for the following MEDICAL CONDITION: (primary reason for Home Healthcare) MEDICAL NECESSITY: I certify, that based on my findings, NURSING services are a medically necessary home health service. HOME BOUND STATUS: I certify that my clinical findings support that this patient is homebound (i.e., Due to illness or injury, pt requires aid of supportive devices such as crutches, cane, wheelchairs, walkers, the use of special transportation or the assistance of another person to leave  their place of residence. There is a normal inability to leave the home and doing so requires considerable and taxing effort. Other absences are for medical reasons / religious services and are infrequent or of short duration when for other reasons). My suggestion at this point is gonna be that we continue with the above wound care measures for the next week and the patient is in agreement that plan. We will subsequently see were things stand at follow-up. If anything changes or worsens the meantime she will contact the office and let me know. Please see above for specific wound care orders. We will see patient for re-evaluation in 1 week(s) here in the clinic. If anything worsens or changes patient will contact our office for additional recommendations. Electronic Signature(s) Signed: 09/24/2018 4:05:21 PM By: Worthy Keeler PA-C Entered By: Worthy Keeler on 09/24/2018 15:49:12 Garzon, [name] ([id number]) -------------------------------------------------------------------------------- ROS/PFSH Details Patient Name: Ken, Latysha C. Date of Service: 09/24/2018 12:45 PM Medical Record Number: [id number] Patient Account Number: [id number] Date of Birth/Sex: Feb 21, 1928 (85 y.o. F) Treating RN: Harold Barban Primary Care Provider: Ria Bush Other Clinician: Referring Provider: Ria Bush Treating Provider/Extender: STONE III, Lenee Franze Weeks in Treatment: 26 Information Obtained From Patient Constitutional Symptoms (General Health) Complaints and Symptoms: Negative for: Fatigue; Fever; Chills; Marked Weight Change Medical History: Past Medical History Notes: High Blood Pressure, open heart surgery, gall stone, hysterectomy; Fluid pill, Anxiety Respiratory Complaints and Symptoms: Negative for: Chronic or frequent coughs; Shortness of Breath Medical History: Negative for: Aspiration; Asthma; Chronic Obstructive Pulmonary Disease (COPD); Pneumothorax; Sleep  Apnea; Tuberculosis Cardiovascular Complaints and Symptoms: Positive for: LE edema Negative for: Chest pain Medical History: Positive for: Arrhythmia; Coronary Artery Disease; Hypertension; Myocardial Infarction Negative for: Angina; Congestive Heart Failure; Deep Vein Thrombosis; Hypotension; Peripheral Arterial Disease; Peripheral Venous Disease; Phlebitis Psychiatric Complaints and Symptoms: Negative for: Anxiety; Claustrophobia Eyes Medical History: Positive for: Cataracts - removed Negative for: Glaucoma; Optic Neuritis Hematologic/Lymphatic Medical History: Positive for: Anemia Negative for: Hemophilia; Human Immunodeficiency Virus; Lymphedema; Sickle Cell Disease Endocrine BLONDIE, RIGGSBEE ([id number]) Medical History: Positive for: Type II Diabetes Negative for: Type I Diabetes Time with diabetes: no idea Treated with: Oral agents Blood sugar tested every day: No Genitourinary Medical History: Negative for: End Stage Renal Disease Immunological Medical History: Negative for: Lupus Erythematosus; Raynaudos; Scleroderma Integumentary (Skin) Medical History: Negative for: History of Burn; History of pressure wounds Musculoskeletal Medical History: Positive for: Osteoarthritis Negative for: Gout; Rheumatoid Arthritis; Osteomyelitis Neurologic Medical History: Negative for: Dementia; Neuropathy; Quadriplegia; Paraplegia; Seizure Disorder Oncologic Medical History: Negative for: Received Chemotherapy; Received Radiation HBO Extended History Items Eyes: Cataracts Immunizations Pneumococcal Vaccine: Received Pneumococcal Vaccination: Yes Implantable Devices No devices added Family and Social History Cancer: No; Diabetes: Yes - Father,Siblings,Child; Heart Disease: No; Hypertension: Yes - Child; Kidney Disease: No; Lung Disease: Yes - Child; Seizures: No; Stroke: Yes - Mother; Thyroid Problems: No; Former smoker; Marital Status - Single; Alcohol Use: Never;  Drug Use: No History; Caffeine Use: Daily; Financial Concerns: No; Food, Clothing or Shelter Needs: No; Support System Lacking:  No; Transportation Concerns: No Physician Affirmation I have reviewed and agree with the above information. KILYNN, FITZSIMMONS ([id number]) Electronic Signature(s) Signed: 09/24/2018 3:57:45 PM By: Harold Barban Signed: 09/24/2018 4:05:21 PM By: Worthy Keeler PA-C Entered By: Worthy Keeler on 09/24/2018 15:48:20 Wotton, [name] ([id number]) -------------------------------------------------------------------------------- SuperBill Details Patient Name: Cremer, Olivine C. Date of Service: 09/24/2018 Medical Record Number: [id number] Patient Account Number: [id number] Date of Birth/Sex: [birth year] (85 y.o. F) Treating RN: Harold Barban Primary Care Provider: Ria Bush Other Clinician: Referring Provider: Ria Bush Treating Provider/Extender: Melburn Hake, Teal Raben Weeks in Treatment: 26 Diagnosis Coding ICD-10 Codes Code Description I87.331 Chronic venous hypertension (idiopathic) with ulcer and inflammation of right lower extremity E11.622 Type 2 diabetes mellitus with other skin ulcer L97.812 Non-pressure chronic ulcer of other part of right lower leg with fat layer exposed L03.115 Cellulitis of right lower limb Facility Procedures CPT4 Code Description: 55374827 11042 - DEB SUBQ TISSUE 20 SQ CM/< ICD-10 Diagnosis Description M78.675 Non-pressure chronic ulcer of other part of right lower leg wi Modifier: th fat layer expo Quantity: 1 sed Physician Procedures CPT4 Code Description: 4492010 11042 - WC PHYS SUBQ TISS 20 SQ CM ICD-10 Diagnosis Description O71.219 Non-pressure chronic ulcer of other part of right lower leg wi Modifier: th fat layer expo Quantity: 1 sed Electronic Signature(s) Signed: 09/24/2018 4:05:21 PM By: Worthy Keeler PA-C Entered By: Worthy Keeler on 09/24/2018 15:49:22

## 2018-09-25 NOTE — Progress Notes (Signed)
ANALIZA, COWGER ([id number]) Visit Report for 09/24/2018 Arrival Information Details Patient Name: [name], [name] Date of Service: 09/24/2018 12:45 PM Medical Record Number: [id number] Patient Account Number: [id number] Date of Birth/Sex: [birth year] (85 y.o. F) Treating RN: Army Melia Primary Care Kendrell Lottman: Ria Bush Other Clinician: Referring Alexza Norbeck: Ria Bush Treating Janasha Barkalow/Extender: Melburn Hake, HOYT Weeks in Treatment: 26 Visit Information History Since Last Visit Added or deleted any medications: No Patient Arrived: Walker Any new allergies or adverse reactions: No Arrival Time: 12:35 Had a fall or experienced change in No Accompanied By: self activities of daily living that may affect Transfer Assistance: None risk of falls: Signs or symptoms of abuse/neglect since last visito No Hospitalized since last visit: No Has Dressing in Place as Prescribed: Yes Pain Present Now: No Electronic Signature(s) Signed: 09/24/2018 3:48:25 PM By: Army Melia Entered By: Army Melia on 09/24/2018 12:35:42 Paolella, [name] ([id number]) -------------------------------------------------------------------------------- Encounter Discharge Information Details Patient Name: [name], [name] Date of Service: 09/24/2018 12:45 PM Medical Record Number: [id number] Patient Account Number: [id number] Date of Birth/Sex: 04/26/1927 (85 y.o. F) Treating RN: Army Melia Primary Care Filipe Greathouse: Ria Bush Other Clinician: Referring Dijon Kohlman: Ria Bush Treating Katelee Schupp/Extender: Melburn Hake, HOYT Weeks in Treatment: 26 Encounter Discharge Information Items Post Procedure Vitals Discharge Condition: Stable Temperature (F): 98.5 Ambulatory Status: Walker Pulse (bpm): 75 Discharge Destination: Home Respiratory Rate (breaths/min): 16 Transportation: Private Auto Blood Pressure (mmHg): 109/61 Accompanied By: self Schedule Follow-up Appointment: Yes Clinical  Summary of Care: Electronic Signature(s) Signed: 09/24/2018 3:48:25 PM By: Army Melia Entered By: Army Melia on 09/24/2018 13:04:03 Bulnes, [name] ([id number]) -------------------------------------------------------------------------------- Lower Extremity Assessment Details Patient Name: [name], [name] Date of Service: 09/24/2018 12:45 PM Medical Record Number: [id number] Patient Account Number: [id number] Date of Birth/Sex: [birth year] (85 y.o. F) Treating RN: Army Melia Primary Care Rahkim Rabalais: Ria Bush Other Clinician: Referring Deysi Soldo: Ria Bush Treating Damyan Corne/Extender: STONE III, HOYT Weeks in Treatment: 26 Edema Assessment Assessed: [Left: No] [Right: No] Edema: [Left: N] [Right: o] Vascular Assessment Pulses: Dorsalis Pedis Palpable: [Right:Yes] Electronic Signature(s) Signed: 09/24/2018 3:48:25 PM By: Army Melia Entered By: Army Melia on 09/24/2018 12:40:23 Piccione, [name] ([id number]) -------------------------------------------------------------------------------- Multi Wound Chart Details Patient Name: [name], [name] Date of Service: 09/24/2018 12:45 PM Medical Record Number: [id number] Patient Account Number: [id number] Date of Birth/Sex: 1927-10-24 (85 y.o. F) Treating RN: Harold Barban Primary Care Djimon Lundstrom: Ria Bush Other Clinician: Referring Lyne Khurana: Ria Bush Treating Annakate Soulier/Extender: STONE III, HOYT Weeks in Treatment: 26 Vital Signs Height(in): 58 Pulse(bpm): 75 Weight(lbs): 140 Blood Pressure(mmHg): 109/61 Body Mass Index(BMI): 29 Temperature(F): 98.5 Respiratory Rate 16 (breaths/min): Photos: [N/A:N/A] Wound Location: Right Lower Leg - Lateral N/A N/A Wounding Event: Trauma N/A N/A Primary Etiology: Diabetic Wound/Ulcer of the N/A N/A Lower Extremity Comorbid History: Cataracts, Anemia, N/A N/A Arrhythmia, Coronary Artery Disease, Hypertension, Myocardial Infarction, Type II Diabetes,  Osteoarthritis Date Acquired: 03/09/2018 N/A N/A Weeks of Treatment: 26 N/A N/A Wound Status: Open N/A N/A Measurements L x W x D 1x0.4x0.1 N/A N/A (cm) Area (cm) : 0.314 N/A N/A Volume (cm) : 0.031 N/A N/A % Reduction in Area: 83.30% N/A N/A % Reduction in Volume: 94.50% N/A N/A Classification: Grade 2 N/A N/A Exudate Amount: Medium N/A N/A Exudate Type: Serous N/A N/A Exudate Color: amber N/A N/A Wound Margin: Flat and Intact N/A N/A Granulation Amount: Small (1-33%) N/A N/A Granulation Quality: Pink N/A N/A Necrotic Amount: Large (67-100%) N/A N/A Exposed Structures: Fat Layer (Subcutaneous N/A N/A Tissue) Exposed: Yes Fascia: No Lohmann, [name] (  [id number]) Tendon: No Muscle: No Joint: No Bone: No Epithelialization: Small (1-33%) N/A N/A Treatment Notes Electronic Signature(s) Signed: 09/24/2018 3:57:45 PM By: Harold Barban Entered By: Harold Barban on 09/24/2018 12:47:16 [name], [name] ([id number]) -------------------------------------------------------------------------------- Palmer Details Patient Name: [name], [name] Date of Service: 09/24/2018 12:45 PM Medical Record Number: [id number] Patient Account Number: [id number] Date of Birth/Sex: 1927/06/18 (85 y.o. F) Treating RN: Harold Barban Primary Care Amarilys Lyles: Ria Bush Other Clinician: Referring Dorissa Stinnette: Ria Bush Treating Tatiyanna Lashley/Extender: Melburn Hake, HOYT Weeks in Treatment: 26 Active Inactive Abuse / Safety / Falls / Self Care Management Nursing Diagnoses: Potential for falls Goals: Patient will not experience any injury related to falls Date Initiated: 03/23/2018 Target Resolution Date: 06/09/2018 Goal Status: Active Interventions: Assess fall risk on admission and as needed Notes: Orientation to the Wound Care Program Nursing Diagnoses: Knowledge deficit related to the wound healing center program Goals: Patient/caregiver will verbalize  understanding of the Force Program Date Initiated: 03/23/2018 Target Resolution Date: 06/09/2018 Goal Status: Active Interventions: Provide education on orientation to the wound center Notes: Pain, Acute or Chronic Nursing Diagnoses: Pain, acute or chronic: actual or potential Goals: Patient will verbalize adequate pain control and receive pain control interventions during procedures as needed Date Initiated: 03/23/2018 Target Resolution Date: 06/09/2018 Goal Status: Active Interventions: Assess comfort goal upon admission [name], [name] ([id number]) Notes: Wound/Skin Impairment Nursing Diagnoses: Impaired tissue integrity Goals: Ulcer/skin breakdown will heal within 14 weeks Date Initiated: 03/23/2018 Target Resolution Date: 06/09/2018 Goal Status: Active Interventions: Assess patient/caregiver ability to obtain necessary supplies Assess patient/caregiver ability to perform ulcer/skin care regimen upon admission and as needed Assess ulceration(s) every visit Notes: Electronic Signature(s) Signed: 09/24/2018 3:57:45 PM By: Harold Barban Entered By: Harold Barban on 09/24/2018 12:47:03 [name], [name] ([id number]) -------------------------------------------------------------------------------- Pain Assessment Details Patient Name: [name], [name] Date of Service: 09/24/2018 12:45 PM Medical Record Number: [id number] Patient Account Number: [id number] Date of Birth/Sex: 05-20-1927 (85 y.o. F) Treating RN: Army Melia Primary Care Shamone Winzer: Ria Bush Other Clinician: Referring Jennene Downie: Ria Bush Treating Sheral Pfahler/Extender: Melburn Hake, HOYT Weeks in Treatment: 26 Active Problems Location of Pain Severity and Description of Pain Patient Has [name] No Site Locations Pain Management and Medication Current Pain Management: Electronic Signature(s) Signed: 09/24/2018 3:48:25 PM By: Army Melia Entered By: Army Melia on 09/24/2018  12:35:51 [name], [name] ([id number]) -------------------------------------------------------------------------------- Patient/Caregiver Education Details Patient Name: Deleonardis, Zaide C. Date of Service: 09/24/2018 12:45 PM Medical Record Number: [id number] Patient Account Number: [id number] Date of Birth/Gender: March 23, 1928 (85 y.o. F) Treating RN: Harold Barban Primary Care Physician: Ria Bush Other Clinician: Referring Physician: Ria Bush Treating Physician/Extender: Sharalyn Ink in Treatment: 26 Education Assessment Education Provided To: Patient Education Topics Provided Wound/Skin Impairment: Handouts: Caring for Your Ulcer Methods: Demonstration, Explain/Verbal Responses: State content correctly Electronic Signature(s) Signed: 09/24/2018 3:57:45 PM By: Harold Barban Entered By: Harold Barban on 09/24/2018 12:47:35 [name], [name] ([id number]) -------------------------------------------------------------------------------- Wound Assessment Details Patient Name: [name], [name] Date of Service: 09/24/2018 12:45 PM Medical Record Number: [id number] Patient Account Number: [id number] Date of Birth/Sex: 05/28/1927 (85 y.o. F) Treating RN: Army Melia Primary Care Rossana Molchan: Ria Bush Other Clinician: Referring Karmina Zufall: Ria Bush Treating Kenslei Hearty/Extender: STONE III, HOYT Weeks in Treatment: 26 Wound Status Wound Number: 8 Primary Diabetic Wound/Ulcer of the Lower Extremity Etiology: Wound Location: Right Lower Leg - Lateral Wound Open Wounding Event: Trauma Status: Date Acquired: 03/09/2018 Comorbid Cataracts, Anemia, Arrhythmia, Coronary Artery Weeks Of Treatment: 26 History: Disease, Hypertension, Myocardial Infarction,  Clustered Wound: No Type II Diabetes, Osteoarthritis Photos Wound Measurements Length: (cm) 1 % Reduction in Width: (cm) 0.4 % Reduction in Depth: (cm) 0.1 Epithelializat Area: (cm)  0.314 Tunneling: Volume: (cm) 0.031 Undermining: Area: 83.3% Volume: 94.5% ion: Small (1-33%) No No Wound Description Classification: Grade 2 Foul Odor Aft Wound Margin: Flat and Intact Slough/Fibrin Exudate Amount: Medium Exudate Type: Serous Exudate Color: amber er Cleansing: No o Yes Wound Bed Granulation Amount: Small (1-33%) Exposed Structure Granulation Quality: Pink Fascia Exposed: No Necrotic Amount: Large (67-100%) Fat Layer (Subcutaneous Tissue) Exposed: Yes Necrotic Quality: Adherent Slough Tendon Exposed: No Muscle Exposed: No Joint Exposed: No Bone Exposed: No Treatment Notes Kaczynski, Margaree C. ([id number]) Wound #8 (Right, Lateral Lower Leg) Notes Sivercel, conform. Patient refused Tubigrip-states she will not wear because she does not like it! Electronic Signature(s) Signed: 09/24/2018 3:48:25 PM By: Army Melia Entered By: Army Melia on 09/24/2018 12:40:06 Keepers, [name] ([id number]) -------------------------------------------------------------------------------- Vitals Details Patient Name: [name], [name] Date of Service: 09/24/2018 12:45 PM Medical Record Number: [id number] Patient Account Number: [id number] Date of Birth/Sex: [birth year] (85 y.o. F) Treating RN: Army Melia Primary Care Braydon Kullman: Ria Bush Other Clinician: Referring Jamesen Stahnke: Ria Bush Treating Roy Snuffer/Extender: STONE III, HOYT Weeks in Treatment: 26 Vital Signs Time Taken: 12:42 Temperature (F): 98.5 Height (in): 58 Pulse (bpm): 75 Weight (lbs): 140 Respiratory Rate (breaths/min): 16 Body Mass Index (BMI): 29.3 Blood Pressure (mmHg): 109/61 Reference Range: 80 - 120 mg / dl Electronic Signature(s) Signed: 09/24/2018 3:48:25 PM By: Army Melia Entered By: Army Melia on 09/24/2018 12:43:56

## 2018-09-26 DIAGNOSIS — L97812 Non-pressure chronic ulcer of other part of right lower leg with fat layer exposed: Secondary | ICD-10-CM | POA: Diagnosis not present

## 2018-09-26 DIAGNOSIS — I10 Essential (primary) hypertension: Secondary | ICD-10-CM | POA: Diagnosis not present

## 2018-09-26 DIAGNOSIS — L03115 Cellulitis of right lower limb: Secondary | ICD-10-CM | POA: Diagnosis not present

## 2018-09-26 DIAGNOSIS — I87331 Chronic venous hypertension (idiopathic) with ulcer and inflammation of right lower extremity: Secondary | ICD-10-CM | POA: Diagnosis not present

## 2018-09-26 DIAGNOSIS — I2510 Atherosclerotic heart disease of native coronary artery without angina pectoris: Secondary | ICD-10-CM | POA: Diagnosis not present

## 2018-09-26 DIAGNOSIS — E119 Type 2 diabetes mellitus without complications: Secondary | ICD-10-CM | POA: Diagnosis not present

## 2018-09-28 DIAGNOSIS — I2510 Atherosclerotic heart disease of native coronary artery without angina pectoris: Secondary | ICD-10-CM | POA: Diagnosis not present

## 2018-09-28 DIAGNOSIS — I87331 Chronic venous hypertension (idiopathic) with ulcer and inflammation of right lower extremity: Secondary | ICD-10-CM | POA: Diagnosis not present

## 2018-09-28 DIAGNOSIS — L97812 Non-pressure chronic ulcer of other part of right lower leg with fat layer exposed: Secondary | ICD-10-CM | POA: Diagnosis not present

## 2018-09-28 DIAGNOSIS — I10 Essential (primary) hypertension: Secondary | ICD-10-CM | POA: Diagnosis not present

## 2018-09-28 DIAGNOSIS — L03115 Cellulitis of right lower limb: Secondary | ICD-10-CM | POA: Diagnosis not present

## 2018-09-28 DIAGNOSIS — E119 Type 2 diabetes mellitus without complications: Secondary | ICD-10-CM | POA: Diagnosis not present

## 2018-10-01 ENCOUNTER — Other Ambulatory Visit: Payer: Self-pay

## 2018-10-01 ENCOUNTER — Encounter: Payer: Medicare Other | Admitting: Physician Assistant

## 2018-10-01 DIAGNOSIS — I252 Old myocardial infarction: Secondary | ICD-10-CM | POA: Diagnosis not present

## 2018-10-01 DIAGNOSIS — I2510 Atherosclerotic heart disease of native coronary artery without angina pectoris: Secondary | ICD-10-CM | POA: Diagnosis not present

## 2018-10-01 DIAGNOSIS — L03115 Cellulitis of right lower limb: Secondary | ICD-10-CM | POA: Diagnosis not present

## 2018-10-01 DIAGNOSIS — E11622 Type 2 diabetes mellitus with other skin ulcer: Secondary | ICD-10-CM | POA: Diagnosis not present

## 2018-10-01 DIAGNOSIS — L97812 Non-pressure chronic ulcer of other part of right lower leg with fat layer exposed: Secondary | ICD-10-CM | POA: Diagnosis not present

## 2018-10-01 DIAGNOSIS — E11621 Type 2 diabetes mellitus with foot ulcer: Secondary | ICD-10-CM | POA: Diagnosis not present

## 2018-10-03 DIAGNOSIS — I10 Essential (primary) hypertension: Secondary | ICD-10-CM | POA: Diagnosis not present

## 2018-10-03 DIAGNOSIS — E119 Type 2 diabetes mellitus without complications: Secondary | ICD-10-CM | POA: Diagnosis not present

## 2018-10-03 DIAGNOSIS — I87331 Chronic venous hypertension (idiopathic) with ulcer and inflammation of right lower extremity: Secondary | ICD-10-CM | POA: Diagnosis not present

## 2018-10-03 DIAGNOSIS — L03115 Cellulitis of right lower limb: Secondary | ICD-10-CM | POA: Diagnosis not present

## 2018-10-03 DIAGNOSIS — I2510 Atherosclerotic heart disease of native coronary artery without angina pectoris: Secondary | ICD-10-CM | POA: Diagnosis not present

## 2018-10-03 DIAGNOSIS — L97812 Non-pressure chronic ulcer of other part of right lower leg with fat layer exposed: Secondary | ICD-10-CM | POA: Diagnosis not present

## 2018-10-03 NOTE — Progress Notes (Signed)
CHANDELL, ATTRIDGE ([id number]) Visit Report for 10/01/2018 Chief Complaint Document Details Patient Name: [name], [name] Date of Service: 10/01/2018 12:30 PM Medical Record Number: [id number] Patient Account Number: [id number] Date of Birth/Sex: [birth year] (85 y.o. F) Treating RN: Harold Barban Primary Care Provider: Ria Bush Other Clinician: Referring Provider: Ria Bush Treating Provider/Extender: Melburn Hake, Marek Nghiem Weeks in Treatment: 27 Information Obtained from: Patient Chief Complaint RLE wounds Electronic Signature(s) Signed: 10/03/2018 12:14:37 AM By: Worthy Keeler PA-C Entered By: Worthy Keeler on 10/01/2018 12:47:00 Gehlhausen, [name] ([id number]) -------------------------------------------------------------------------------- Debridement Details Patient Name: [name], [name] Date of Service: 10/01/2018 12:30 PM Medical Record Number: [id number] Patient Account Number: [id number] Date of Birth/Sex: 1927-11-16 (85 y.o. F) Treating RN: Harold Barban Primary Care Provider: Ria Bush Other Clinician: Referring Provider: Ria Bush Treating Provider/Extender: Melburn Hake, Jaryd Drew Weeks in Treatment: 27 Debridement Performed for Wound #8 Right,Lateral Lower Leg Assessment: Performed By: Physician STONE III, Temple Ewart E., PA-C Debridement Type: Debridement Severity of Tissue Pre Fat layer exposed Debridement: Level of Consciousness (Pre- Awake and Alert procedure): Pre-procedure Verification/Time Yes - 12:52 Out Taken: Start Time: 12:52 Pain Control: Lidocaine Total Area Debrided (L x W): 1 (cm) x 0.5 (cm) = 0.5 (cm) Tissue and other material Viable, Non-Viable, Slough, Subcutaneous, Slough debrided: Level: Skin/Subcutaneous Tissue Debridement Description: Excisional Instrument: Curette Bleeding: None End Time: 12:55 Procedural Pain: 0 Post Procedural Pain: 0 Response to Treatment: Procedure was tolerated well Level of  Consciousness Awake and Alert (Post-procedure): Post Debridement Measurements of Total Wound Length: (cm) 1 Width: (cm) 0.5 Depth: (cm) 0.1 Volume: (cm) 0.039 Character of Wound/Ulcer Post Debridement: Improved Severity of Tissue Post Debridement: Fat layer exposed Post Procedure Diagnosis Same as Pre-procedure Electronic Signature(s) Signed: 10/01/2018 4:40:23 PM By: Harold Barban Signed: 10/03/2018 12:14:37 AM By: Worthy Keeler PA-C Entered By: Harold Barban on 10/01/2018 12:55:57 [name], [name] ([id number]) -------------------------------------------------------------------------------- HPI Details Patient Name: [name], [name] Date of Service: 10/01/2018 12:30 PM Medical Record Number: [id number] Patient Account Number: [id number] Date of Birth/Sex: [birth year] (85 y.o. F) Treating RN: Harold Barban Primary Care Provider: Ria Bush Other Clinician: Referring Provider: Ria Bush Treating Provider/Extender: Melburn Hake, Marja Adderley Weeks in Treatment: 27 History of Present Illness HPI Description: Readmission: 03/23/18 on evaluation today patient presents for readmission entire clinic concerning issues that she has been having for the past couple of weeks in regard to her right lower extremity. The medial injury actually occurred when her dog tried to take a blanket from her and subsequently scratched her with his toenail. Subsequently the lateral injury was a wound where she dropped a knife in her kitchen and hit the side of her leg. With that being said both have been open for some time and home health nurse that has been coming out marked for her where the redness was several days ago. Nonetheless this has spread will be on the margin of what was marked. She was placed on Keflex which was started two days ago but at this point it does not seem to have helped with any improvement in the overall appearance of the wounds. No fevers, chills, nausea, or vomiting  noted at this time. 04/02/18 Seen today for follow up and management of right lower extremity wounds. Right lateral wound with adherent slough. She is complaining of pain to touch which limited exam. Recently treated in the ED for cellulitis and wound culture positive for MRSA. Completed a dose of Vancomycin and transitioned to doxycycline by mouth. Erythema remains present with improvement since  last visit. Right medial wound with scab present. No fevers, chills, nausea, or vomiting noted at this time. 04/10/18 on evaluation today patient appears to be doing a [place]bury better in regard to her right lateral lower [name]. The slough does seem to be coming off although it is slow from the wound bed. Fortunately there does not appear to be any evidence of infection at this time which is good news. With that being said I do believe that she is going to require longer treatment with the Santyl simply due to the fact that I'm not able to really debride the wound due to the pain that she is experiencing. 04/16/18 upon evaluation today patient's wound bed currently on the lateral lower extremity on the right appears to be doing a Geller better at this point. We are utilizing sensible currently. Overall I'm very pleased with how things are progressing although there still is some work to do before this will be completely healed. No fevers, chills, nausea, or vomiting noted at this time. 04/23/18 on evaluation today patient appears to be doing about the same at this point maybe just a Hlad bit smaller than last week's evaluation. She continues to have some slough buildup on the surface of the wound and has very Strassner granulation unfortunately. This seems to be very slow moving I'm getting somewhat concerned about the possibility of her having decreased blood flow which is leading to her subsequently not being able to heal this area as quickly as she shut otherwise. She is currently utilizing  Santyl. 05/01/18 on evaluation today patient appears to be doing somewhat poorly in regard to her lower extremity ulcer. At this point she's been tolerating the dressing changes to some degree without complication. With that being said she does note of the past several days that she has been having more pain unfortunately. On inspection it appears that she may have signs of infection which would account for the pain. 05/07/18 on evaluation today patient's wound actually appears to be doing better she's not having as much erythema in the right lower extremity as compared to last week. Fortunately there's no signs of infection this is good news as well. Overall very pleased with the progress that she has made up to this point. 05/14/18 on evaluation today patient actually appears to be doing rather well in regard to her right lower extremity ulcer. She does not have erythema that was characterizing the cellulitis prior to initiation of antibiotic therapy. With that being said her wound still appears to be somewhat slow in healing I believe this may be arterial in nature that is my concern at least. With that being said I do want her to see the vascular specialist as soon as possible we been trying to get this set up I'm not sure if she's not answering the phone or what exactly is going on here. [name], [name] ([id number]) 05/21/18 on evaluation today patient's wound actually appears to be doing a Milburn better possibly some additional granulation compared to my last evaluation. With that being said she still continues to have a lot of pain especially when her legs are elevated such as sitting in the chair here in the office. There does not appear to be any signs of infection which is good news. 05/28/18 on evaluation today patient appears to be doing a Preece better in regard to her right lateral lower [name]. Again she was supposed to have an appointment with vascular unfortunately they  attempted to  call her three times and unfortunately were unable to get anything scheduled. That reason closed up for him. She tells me that if the phone is answered and she doesn't recognize what is that she will hang up as well those that she lives with. Obviously she's not gonna recognize somebody calling from the office that she has not previously spoken with. Nonetheless the patient states that she does not remember any fall. I think if we're gonna get her in with vascular specialist we're gonna have to try to make the appointment for her. 06/04/2018 Seen today for follow up and management of right lateral lower extremity wound. Adherent slough present over surface of wound. Scheduled to follow up for ABI/TBI on 06/06/2018 with vein and vascular specialist. She is hard of hearing. Wound is sensitive to touch. No signs or symptoms of infection. Denies any recent fevers, chills, or SOB. 06/11/18 on evaluation today patient actually appears to be doing very well in regard to her right lateral lower extremity ulcer. She's been tolerating the dressing changes without complication. Fortunately there's no evidence of infection. With that being said she did have her arterial studies which revealed that she had a very low ABI which was 0.67 on the right with a TBI of 0.32 and 0.65 on the left with a TBI of 0.16. I do believe she needs to see vascular and we are working on that referral as well. 06/21/18 on evaluation today patient's wound actually appears to be doing okay she does have an appointment with vascular coming up on the 24th. Nonetheless she's been having some issues that are more urinary in nature right now that are causing trouble for her. Fortunately there's no signs of severe sepsis although she is running a low-grade temperature 99.9 on evaluation today. She also is having some issues right now with chills as well as urinary urgency and frequency. Again I feel like the city is more urinary and  symptomology I do not believe she has anything upper respiratory which is good news. Nonetheless I think this may need to be checked out more quickly especially in light of the fact that she's having significant back pain at this point. 07/02/18 on evaluation today patient appears to be doing okay in regard to her ulcer on the right lower extremity. She's been tolerating the dressing changes without complication. Fortunately there does not appear to be any signs of infection which is good news. No fevers, chills, nausea, or vomiting noted at this time. 07/10/18 on evaluation today patient appears to be doing well in regard to her right lateral lower extremity ulcer all things considering. She's been using this form this seems to be the one thing that she can really do on her own and she seems to be making some progress obviously help with this matter. I think the biggest issue limiting her feeling speed is her blood flow she canceled her original appointment with vascular her next one is not till May. She was worried about the Covid-19 Virus and having to see another physician. Nonetheless there's no signs of active infection at this time. 07/16/18 on evaluation today patient appears to be doing okay in regard to her right lateral lower extremity ulcer. She's been tolerating the dressing changes without complication. Fortunately there's no signs of active infection at this time. No fevers, chills, nausea, or vomiting noted at this time. 07/23/18 on evaluation today patient appears to be doing very well in regard to her right lateral load should be  ulcer all things considering. She still again is not seeing vascular until mid May in the meantime she's been utilizing the over-the-counter tripling about appointment which seems to be the only thing she's able to do routinely on her own without getting confused or frustrated. Overall it seems to be doing okay fortunately she's showing no signs of  infection. 07/30/18 upon evaluation today patient actually appears to be doing about the same in regard to her wound in general. She is making very slow progress most likely again due to the issue with her blood flow she sees vascular towards the middle of news. In the meantime we've really been trying to just keep things in the control from the infection standpoint and allow for any healing that could take place up until appointment she can see vascular. 08/06/18 upon evaluation today patient's wound actually appears to be doing about the same although the wound bed may show Wiker bit more granulation which is good news. Fortunately there's no signs of active infection at this time. No fevers, chills, nausea, or vomiting noted at this time. 08/14/18 on evaluation today patient actually appears to be doing about the same in regard to her lower extremity ulcer. Mierzwa, Alainah C. ([id number]) Fortunately there's no signs of active infection at this time which is good news. Fortunately the patient has been tolerating the dressing changes without complication. No fevers, chills, nausea, or vomiting noted at this time. She does have her appointment with vascular next week. 08/20/18 on evaluation today patient actually appears to be doing a Grullon better in my opinion with regard to her lower extremity ulcer. She's been tolerating the dressing changes without complication. Fortunately there's no signs of active infection at this time which is good news. She actually will be seen vascular tomorrow. 08/30/18 on evaluation today patient's wound actually appears to be showing some signs of improvement which is good news. Fortunately there's no signs of infection. She does the the vascular doctor on June 4. 09/10/18 on evaluation today patient is status post having had an angiogram with Dr. Lucky Cowboy. Unfortunately she tells me that she still having a lot of discomfort although the wound bed itself seems to be doing  better. She does have more swelling of the right lower extremity but nothing too significant. I think this is causing Gunnels bit of increased pain right now as well that is unfortunate. No fevers, chills, nausea, or vomiting noted at this time. 09/17/18 on evaluation today patient actually appears to be doing much better today compared to last week's evaluation. I'm very pleased in this regard. Fortunately there's no signs of active infection which is also good news. 09/24/18 on evaluation today patient appears to be doing well in regard to the wounds on the right lateral lower extremity. Since her vascular intervention I do feel like she's made excellent progress. Fortunately there's no signs of active infection at this time. No fevers, chills, nausea, or vomiting noted at this time. 10/01/18 on evaluation today patient actually appears to be doing well in regard to right lateral lower Trinity ulcers. I think it's the right time at this point to go ahead and switch to [facility] as the dressing of choice. We've been utilizing the alginate and that has been beneficial but she's not noticing as much drainage in fact it's actually staying more dry at this point. We're gonna see how things do with the dressing change I'll see her back next week I'll be seeing that regard in the meantime  she continues to be depressed we did talk about the possibility of seeing a psychiatrist actually has one who she sees and has seen for quite a while. She actually sees her later this month. Electronic Signature(s) Signed: 10/03/2018 12:14:37 AM By: Worthy Keeler PA-C Entered By: Worthy Keeler on 10/01/2018 13:03:58 [name], [name] ([id number]) -------------------------------------------------------------------------------- Physical Exam Details Patient Name: [name], [name] Date of Service: 10/01/2018 12:30 PM Medical Record Number: [id number] Patient Account Number: [id number] Date of Birth/Sex: [birth year] (85 y.o.  F) Treating RN: Harold Barban Primary Care Provider: Ria Bush Other Clinician: Referring Provider: Ria Bush Treating Provider/Extender: STONE III, Tawnia Schirm Weeks in Treatment: 90 Constitutional Well-nourished and well-hydrated in no acute distress. Respiratory normal breathing without difficulty. clear to auscultation bilaterally. Cardiovascular regular rate and rhythm with normal S1, S2. Psychiatric this patient is able to make decisions and demonstrates good insight into disease process. Alert and Oriented x 3. pleasant and cooperative. Notes Patient's wound bed currently showed signs of fairly good granulation there does not appear to be any signs of active infection which is good news. Sharp debridement was performed to clear away necrotic debris from the surface of the wound down to good subcutaneous tissue she seems to be doing quite well in that regard. Electronic Signature(s) Signed: 10/03/2018 12:14:37 AM By: Worthy Keeler PA-C Entered By: Worthy Keeler on 10/01/2018 13:05:03 [name], [name] ([id number]) -------------------------------------------------------------------------------- Physician Orders Details Patient Name: Kydd, Reesha C. Date of Service: 10/01/2018 12:30 PM Medical Record Number: [id number] Patient Account Number: [id number] Date of Birth/Sex: 1928-02-24 (85 y.o. F) Treating RN: Harold Barban Primary Care Provider: Ria Bush Other Clinician: Referring Provider: Ria Bush Treating Provider/Extender: Melburn Hake, Fujiko Picazo Weeks in Treatment: 55 Verbal / Phone Orders: No Diagnosis Coding ICD-10 Coding Code Description I87.331 Chronic venous hypertension (idiopathic) with ulcer and inflammation of right lower extremity E11.622 Type 2 diabetes mellitus with other skin ulcer L97.812 Non-pressure chronic ulcer of other part of right lower leg with fat layer exposed L03.115 Cellulitis of right lower limb Wound Cleansing Wound  #8 Right,Lateral Lower Leg o Clean wound with Normal Saline. Anesthetic (add to Medication List) Wound #8 Right,Lateral Lower Leg o Topical Lidocaine 4% cream applied to wound bed prior to debridement (In Clinic Only). Primary Wound Dressing Wound #8 Right,Lateral Lower Leg o Silver Collagen Secondary Dressing Wound #8 Right,Lateral Lower Leg o Conform/Kerlix o XtraSorb - Stretchy to secure Dressing Change Frequency Wound #8 Right,Lateral Lower Leg o Change Dressing Monday, Wednesday, Friday Follow-up Appointments Wound #8 Right,Lateral Lower Leg o Return Appointment in 1 week. Edema Control Wound #8 Right,Lateral Lower Leg o Elevate legs to the level of the heart and pump ankles as often as possible Home Health Wound #8 [facility] RIDHI, HOFFERT ([id number]) o FACE TO FACE ENCOUNTER: MEDICARE and MEDICAID PATIENTS: I certify that this patient is under my care and that I had a face-to-face encounter that meets the physician face-to-face encounter requirements with this patient on this date. The encounter with the patient was in whole or in part for the following MEDICAL CONDITION: (primary reason for [place]) MEDICAL NECESSITY: I certify, that based on my findings, NURSING services are a medically necessary home health service. HOME BOUND STATUS: I certify that my clinical findings support that this patient is homebound (i.e., Due to illness or injury, pt requires aid of supportive devices such as crutches, cane, wheelchairs, walkers, the use of special transportation or the assistance  of another person to leave their place of residence. There is a normal inability to leave the home and doing so requires considerable and taxing effort. Other absences are for medical reasons / religious services and are infrequent or of short duration when for other reasons). Electronic Signature(s) Signed:  10/01/2018 4:40:23 PM By: Harold Barban Signed: 10/03/2018 12:14:37 AM By: Worthy Keeler PA-C Entered By: Harold Barban on 10/01/2018 13:05:10 [name], [name] ([id number]) -------------------------------------------------------------------------------- Problem List Details Patient Name: Bellavance, Joscelyn C. Date of Service: 10/01/2018 12:30 PM Medical Record Number: [id number] Patient Account Number: [id number] Date of Birth/Sex: 03-28-1928 (85 y.o. F) Treating RN: Harold Barban Primary Care Provider: Ria Bush Other Clinician: Referring Provider: Ria Bush Treating Provider/Extender: Melburn Hake, Geron Mulford Weeks in Treatment: 27 Active Problems ICD-10 Evaluated Encounter Code Description Active Date Today Diagnosis I87.331 Chronic venous hypertension (idiopathic) with ulcer and 03/23/2018 No Yes inflammation of right lower extremity E11.622 Type 2 diabetes mellitus with other skin ulcer 03/23/2018 No Yes L97.812 Non-pressure chronic ulcer of other part of right lower leg 03/23/2018 No Yes with fat layer exposed L03.115 Cellulitis of right lower limb 03/23/2018 No Yes Inactive Problems Resolved Problems Electronic Signature(s) Signed: 10/03/2018 12:14:37 AM By: Worthy Keeler PA-C Entered By: Worthy Keeler on 10/01/2018 12:45:49 [name], [name] ([id number]) -------------------------------------------------------------------------------- Progress Note Details Patient Name: Bouchard, Charlita C. Date of Service: 10/01/2018 12:30 PM Medical Record Number: [id number] Patient Account Number: [id number] Date of Birth/Sex: 01/01/1928 (85 y.o. F) Treating RN: Harold Barban Primary Care Provider: Ria Bush Other Clinician: Referring Provider: Ria Bush Treating Provider/Extender: Melburn Hake, Sussan Meter Weeks in Treatment: 27 Subjective Chief Complaint Information obtained from Patient RLE wounds History of Present Illness (HPI) Readmission: 03/23/18 on  evaluation today patient presents for readmission entire clinic concerning issues that she has been having for the past couple of weeks in regard to her right lower extremity. The medial injury actually occurred when her dog tried to take a blanket from her and subsequently scratched her with his toenail. Subsequently the lateral injury was a wound where she dropped a knife in her kitchen and hit the side of her leg. With that being said both have been open for some time and home health nurse that has been coming out marked for her where the redness was several days ago. Nonetheless this has spread will be on the margin of what was marked. She was placed on Keflex which was started two days ago but at this point it does not seem to have helped with any improvement in the overall appearance of the wounds. No fevers, chills, nausea, or vomiting noted at this time. 04/02/18 Seen today for follow up and management of right lower extremity wounds. Right lateral wound with adherent slough. She is complaining of pain to touch which limited exam. Recently treated in the ED for cellulitis and wound culture positive for MRSA. Completed a dose of Vancomycin and transitioned to doxycycline by mouth. Erythema remains present with improvement since last visit. Right medial wound with scab present. No fevers, chills, nausea, or vomiting noted at this time. 04/10/18 on evaluation today patient appears to be doing a Maka better in regard to her right lateral lower [name]. The slough does seem to be coming off although it is slow from the wound bed. Fortunately there does not appear to be any evidence of infection at this time which is good news. With that being said I do believe that she is going to require longer treatment with  the Santyl simply due to the fact that I'm not able to really debride the wound due to the pain that she is experiencing. 04/16/18 upon evaluation today patient's wound bed currently  on the lateral lower extremity on the right appears to be doing a Trigg better at this point. We are utilizing sensible currently. Overall I'm very pleased with how things are progressing although there still is some work to do before this will be completely healed. No fevers, chills, nausea, or vomiting noted at this time. 04/23/18 on evaluation today patient appears to be doing about the same at this point maybe just a Florendo bit smaller than last week's evaluation. She continues to have some slough buildup on the surface of the wound and has very Mencer granulation unfortunately. This seems to be very slow moving I'm getting somewhat concerned about the possibility of her having decreased blood flow which is leading to her subsequently not being able to heal this area as quickly as she shut otherwise. She is currently utilizing Santyl. 05/01/18 on evaluation today patient appears to be doing somewhat poorly in regard to her lower extremity ulcer. At this point she's been tolerating the dressing changes to some degree without complication. With that being said she does note of the past several days that she has been having more pain unfortunately. On inspection it appears that she may have signs of infection which would account for the pain. 05/07/18 on evaluation today patient's wound actually appears to be doing better she's not having as much erythema in the right lower extremity as compared to last week. Fortunately there's no signs of infection this is good news as well. Overall very pleased with the progress that she has made up to this point. IVORY, MADURO ([id number]) 05/14/18 on evaluation today patient actually appears to be doing rather well in regard to her right lower extremity ulcer. She does not have erythema that was characterizing the cellulitis prior to initiation of antibiotic therapy. With that being said her wound still appears to be somewhat slow in healing I believe this  may be arterial in nature that is my concern at least. With that being said I do want her to see the vascular specialist as soon as possible we been trying to get this set up I'm not sure if she's not answering the phone or what exactly is going on here. 05/21/18 on evaluation today patient's wound actually appears to be doing a Mikes better possibly some additional granulation compared to my last evaluation. With that being said she still continues to have a lot of pain especially when her legs are elevated such as sitting in the chair here in the office. There does not appear to be any signs of infection which is good news. 05/28/18 on evaluation today patient appears to be doing a Gearin better in regard to her right lateral lower [name]. Again she was supposed to have an appointment with vascular unfortunately they attempted to call her three times and unfortunately were unable to get anything scheduled. That reason closed up for him. She tells me that if the phone is answered and she doesn't recognize what is that she will hang up as well those that she lives with. Obviously she's not gonna recognize somebody calling from the office that she has not previously spoken with. Nonetheless the patient states that she does not remember any fall. I think if we're gonna get her in with vascular specialist we're gonna have to try  to make the appointment for her. 06/04/2018 Seen today for follow up and management of right lateral lower extremity wound. Adherent slough present over surface of wound. Scheduled to follow up for ABI/TBI on 06/06/2018 with vein and vascular specialist. She is hard of hearing. Wound is sensitive to touch. No signs or symptoms of infection. Denies any recent fevers, chills, or SOB. 06/11/18 on evaluation today patient actually appears to be doing very well in regard to her right lateral lower extremity ulcer. She's been tolerating the dressing changes without complication.  Fortunately there's no evidence of infection. With that being said she did have her arterial studies which revealed that she had a very low ABI which was 0.67 on the right with a TBI of 0.32 and 0.65 on the left with a TBI of 0.16. I do believe she needs to see vascular and we are working on that referral as well. 06/21/18 on evaluation today patient's wound actually appears to be doing okay she does have an appointment with vascular coming up on the 24th. Nonetheless she's been having some issues that are more urinary in nature right now that are causing trouble for her. Fortunately there's no signs of severe sepsis although she is running a low-grade temperature 99.9 on evaluation today. She also is having some issues right now with chills as well as urinary urgency and frequency. Again I feel like the city is more urinary and symptomology I do not believe she has anything upper respiratory which is good news. Nonetheless I think this may need to be checked out more quickly especially in light of the fact that she's having significant back pain at this point. 07/02/18 on evaluation today patient appears to be doing okay in regard to her ulcer on the right lower extremity. She's been tolerating the dressing changes without complication. Fortunately there does not appear to be any signs of infection which is good news. No fevers, chills, nausea, or vomiting noted at this time. 07/10/18 on evaluation today patient appears to be doing well in regard to her right lateral lower extremity ulcer all things considering. She's been using this form this seems to be the one thing that she can really do on her own and she seems to be making some progress obviously help with this matter. I think the biggest issue limiting her feeling speed is her blood flow she canceled her original appointment with vascular her next one is not till May. She was worried about the Covid-19 Virus and having to see another physician.  Nonetheless there's no signs of active infection at this time. 07/16/18 on evaluation today patient appears to be doing okay in regard to her right lateral lower extremity ulcer. She's been tolerating the dressing changes without complication. Fortunately there's no signs of active infection at this time. No fevers, chills, nausea, or vomiting noted at this time. 07/23/18 on evaluation today patient appears to be doing very well in regard to her right lateral load should be ulcer all things considering. She still again is not seeing vascular until mid May in the meantime she's been utilizing the over-the-counter tripling about appointment which seems to be the only thing she's able to do routinely on her own without getting confused or frustrated. Overall it seems to be doing okay fortunately she's showing no signs of infection. 07/30/18 upon evaluation today patient actually appears to be doing about the same in regard to her wound in general. She is making very slow progress most likely again  due to the issue with her blood flow she sees vascular towards the middle of news. In the meantime we've really been trying to just keep things in the control from the infection standpoint and allow for any healing that could take place up until appointment she can see vascular. [name], PORR ([id number]) 08/06/18 upon evaluation today patient's wound actually appears to be doing about the same although the wound bed may show [place] bit more granulation which is good news. Fortunately there's no signs of active infection at this time. No fevers, chills, nausea, or vomiting noted at this time. 08/14/18 on evaluation today patient actually appears to be doing about the same in regard to her lower extremity ulcer. Fortunately there's no signs of active infection at this time which is good news. Fortunately the patient has been tolerating the dressing changes without complication. No fevers, chills, nausea, or  vomiting noted at this time. She does have her appointment with vascular next week. 08/20/18 on evaluation today patient actually appears to be doing a [place] better in my opinion with regard to her lower extremity ulcer. She's been tolerating the dressing changes without complication. Fortunately there's no signs of active infection at this time which is good news. She actually will be seen vascular tomorrow. 08/30/18 on evaluation today patient's wound actually appears to be showing some signs of improvement which is good news. Fortunately there's no signs of infection. She does the the vascular doctor on June 4. 09/10/18 on evaluation today patient is status post having had an angiogram with Dr. Lucky Cowboy. Unfortunately she tells me that she still having a lot of discomfort although the wound bed itself seems to be doing better. She does have more swelling of the right lower extremity but nothing too significant. I think this is causing Gibbons bit of increased pain right now as well that is unfortunate. No fevers, chills, nausea, or vomiting noted at this time. 09/17/18 on evaluation today patient actually appears to be doing much better today compared to last week's evaluation. I'm very pleased in this regard. Fortunately there's no signs of active infection which is also good news. 09/24/18 on evaluation today patient appears to be doing well in regard to the wounds on the right lateral lower extremity. Since her vascular intervention I do feel like she's made excellent progress. Fortunately there's no signs of active infection at this time. No fevers, chills, nausea, or vomiting noted at this time. 10/01/18 on evaluation today patient actually appears to be doing well in regard to right lateral lower Trinity ulcers. I think it's the right time at this point to go ahead and switch to [facility] as the dressing of choice. We've been utilizing the alginate and that has been beneficial but she's not noticing as  much drainage in fact it's actually staying more dry at this point. We're gonna see how things do with the dressing change I'll see her back next week I'll be seeing that regard in the meantime she continues to be depressed we did talk about the possibility of seeing a psychiatrist actually has one who she sees and has seen for quite a while. She actually sees her later this month. Patient History Information obtained from Patient. Family History Diabetes - Father,Siblings,Child, Hypertension - Child, Lung Disease - Child, Stroke - Mother, No family history of Cancer, Heart Disease, Kidney Disease, Seizures, Thyroid Problems. Social History Former smoker, Marital Status - Single, Alcohol Use - Never, Drug Use - No History, Caffeine Use -  Daily. Medical History Eyes Patient has history of Cataracts - removed Denies history of Glaucoma, Optic Neuritis Hematologic/Lymphatic Patient has history of Anemia Denies history of Hemophilia, Human Immunodeficiency Virus, Lymphedema, Sickle Cell Disease Respiratory Denies history of Aspiration, Asthma, Chronic Obstructive Pulmonary Disease (COPD), Pneumothorax, Sleep Apnea, Tuberculosis Cardiovascular Patient has history of Arrhythmia, Coronary Artery Disease, Hypertension, Myocardial Infarction Denies history of Angina, Congestive Heart Failure, Deep Vein Thrombosis, Hypotension, Peripheral Arterial Disease, [place], Dilia C. ([id number]) Peripheral Venous Disease, Phlebitis Endocrine Patient has history of Type II Diabetes Denies history of Type I Diabetes Genitourinary Denies history of End Stage Renal Disease Immunological Denies history of Lupus Erythematosus, Raynaud s, Scleroderma Integumentary (Skin) Denies history of History of Burn, History of pressure wounds Musculoskeletal Patient has history of Osteoarthritis Denies history of Gout, Rheumatoid Arthritis, Osteomyelitis Neurologic Denies history of Dementia, Neuropathy,  Quadriplegia, Paraplegia, Seizure Disorder Oncologic Denies history of Received Chemotherapy, Received Radiation Medical And Surgical History Notes Constitutional Symptoms (General Health) High Blood Pressure, open heart surgery, gall stone, hysterectomy; Fluid pill, Anxiety Review of Systems (ROS) Constitutional Symptoms (General Health) Denies complaints or symptoms of Fatigue, Fever, Chills, Marked Weight Change. Respiratory Denies complaints or symptoms of Chronic or frequent coughs, Shortness of Breath. Cardiovascular Denies complaints or symptoms of Chest pain, LE edema. Psychiatric Denies complaints or symptoms of Anxiety, Claustrophobia. Objective Constitutional Well-nourished and well-hydrated in no acute distress. Vitals Time Taken: 12:37 PM, Height: 58 in, Weight: 140 lbs, BMI: 29.3, Temperature: 98.5 F, Pulse: 75 bpm, Respiratory Rate: 16 breaths/min, Blood Pressure: 123/48 mmHg. Respiratory normal breathing without difficulty. clear to auscultation bilaterally. Cardiovascular regular rate and rhythm with normal S1, S2. Psychiatric this patient is able to make decisions and demonstrates good insight into disease process. Alert and Oriented x 3. pleasant and cooperative. [name], [name] ([id number]) General Notes: Patient's wound bed currently showed signs of fairly good granulation there does not appear to be any signs of active infection which is good news. Sharp debridement was performed to clear away necrotic debris from the surface of the wound down to good subcutaneous tissue she seems to be doing quite well in that regard. Integumentary (Hair, Skin) Wound #8 status is Open. Original cause of wound was Trauma. The wound is located on the Right,Lateral Lower Leg. The wound measures 1cm length x 0.5cm width x 0.1cm depth; 0.393cm^2 area and 0.039cm^3 volume. There is Fat Layer (Subcutaneous Tissue) Exposed exposed. There is no tunneling or undermining noted. There  is a medium amount of serous drainage noted. The wound margin is flat and intact. There is small (1-33%) pink granulation within the wound bed. There is a large (67-100%) amount of necrotic tissue within the wound bed including Eschar and Adherent Slough. Assessment Active Problems ICD-10 Chronic venous hypertension (idiopathic) with ulcer and inflammation of right lower extremity Type 2 diabetes mellitus with other skin ulcer Non-pressure chronic ulcer of other part of right lower leg with fat layer exposed Cellulitis of right lower limb Procedures Wound #8 Pre-procedure diagnosis of Wound #8 is a Diabetic Wound/Ulcer of the Lower Extremity located on the Right,Lateral Lower Leg .Severity of Tissue Pre Debridement is: Fat layer exposed. There was a Excisional Skin/Subcutaneous Tissue Debridement with a total area of 0.5 sq cm performed by STONE III, Sophie Tamez E., PA-C. With the following instrument(s): Curette to remove Viable and Non-Viable tissue/material. Material removed includes Subcutaneous Tissue and Slough and after achieving pain control using Lidocaine. No specimens were taken. A time out was conducted at 12:52, prior to  the start of the procedure. There was no bleeding. The procedure was tolerated well with a pain level of 0 throughout and a pain level of 0 following the procedure. Post Debridement Measurements: 1cm length x 0.5cm width x 0.1cm depth; 0.039cm^3 volume. Character of Wound/Ulcer Post Debridement is improved. Severity of Tissue Post Debridement is: Fat layer exposed. Post procedure Diagnosis Wound #8: Same as Pre-Procedure Plan Wound Cleansing: Wound #8 Right,Lateral Lower Leg: Clean wound with Normal Saline. Anesthetic (add to Medication List): Wound #8 Right,Lateral Lower Leg: Topical Lidocaine 4% cream applied to wound bed prior to debridement (In Clinic Only). Primary Wound Dressing: Wound #8 Right,Lateral Lower Leg: Laufer, Skie C. ([id number]) Silver  Collagen Secondary Dressing: Wound #8 Right,Lateral Lower Leg: Conform/Kerlix XtraSorb - Stretchy to secure Dressing Change Frequency: Wound #8 Right,Lateral Lower Leg: Change Dressing Monday, Wednesday, Friday Follow-up Appointments: Wound #8 Right,Lateral Lower Leg: Return Appointment in 1 week. Edema Control: Wound #8 Right,Lateral Lower Leg: Elevate legs to the level of the heart and pump ankles as often as possible Home Health: Wound #8 Right,Lateral Lower Leg: [facility] FACE TO FACE ENCOUNTER: MEDICARE and MEDICAID PATIENTS: I certify that this patient is under my care and that I had a face-to-face encounter that meets the physician face-to-face encounter requirements with this patient on this date. The encounter with the patient was in whole or in part for the following MEDICAL CONDITION: (primary reason for [place]) MEDICAL NECESSITY: I certify, that based on my findings, NURSING services are a medically necessary home health service. HOME BOUND STATUS: I certify that my clinical findings support that this patient is homebound (i.e., Due to illness or injury, pt requires aid of supportive devices such as crutches, cane, wheelchairs, walkers, the use of special transportation or the assistance of another person to leave their place of residence. There is a normal inability to leave the home and doing so requires considerable and taxing effort. Other absences are for medical reasons / religious services and are infrequent or of short duration when for other reasons). My suggestion currently is gonna be that we continue with the above wound care measures for the next week and the patient is in agreement with plan. We will see how the Prisma does for her. Hopefully she'll have good improvement. If anything changes worsens meantime she will contact the office and let me know. Please see above for specific wound care orders. We will see patient  for re-evaluation in 1 week(s) here in the clinic. If anything worsens or changes patient will contact our office for additional recommendations. Electronic Signature(s) Signed: 10/03/2018 12:14:37 AM By: Worthy Keeler PA-C Entered By: Worthy Keeler on 10/01/2018 13:06:20 Kuehl, [name] ([id number]) -------------------------------------------------------------------------------- ROS/PFSH Details Patient Name: Mcray, Jaselyn C. Date of Service: 10/01/2018 12:30 PM Medical Record Number: [id number] Patient Account Number: [id number] Date of Birth/Sex: October 02, 1927 (85 y.o. F) Treating RN: Harold Barban Primary Care Provider: Ria Bush Other Clinician: Referring Provider: Ria Bush Treating Provider/Extender: STONE III, Jasilyn Holderman Weeks in Treatment: 27 Information Obtained From Patient Constitutional Symptoms (General Health) Complaints and Symptoms: Negative for: Fatigue; Fever; Chills; Marked Weight Change Medical History: Past Medical History Notes: High Blood Pressure, open heart surgery, gall stone, hysterectomy; Fluid pill, Anxiety Respiratory Complaints and Symptoms: Negative for: Chronic or frequent coughs; Shortness of Breath Medical History: Negative for: Aspiration; Asthma; Chronic Obstructive Pulmonary Disease (COPD); Pneumothorax; Sleep Apnea; Tuberculosis Cardiovascular Complaints and Symptoms: Negative for: Chest pain; LE edema Medical History: Positive for:  Arrhythmia; Coronary Artery Disease; Hypertension; Myocardial Infarction Negative for: Angina; Congestive Heart Failure; Deep Vein Thrombosis; Hypotension; Peripheral Arterial Disease; Peripheral Venous Disease; Phlebitis Psychiatric Complaints and Symptoms: Negative for: Anxiety; Claustrophobia Eyes Medical History: Positive for: Cataracts - removed Negative for: Glaucoma; Optic Neuritis Hematologic/Lymphatic Medical History: Positive for: Anemia Negative for: Hemophilia; Human  Immunodeficiency Virus; Lymphedema; Sickle Cell Disease Endocrine MIKHAELA, ZAUGG ([id number]) Medical History: Positive for: Type II Diabetes Negative for: Type I Diabetes Time with diabetes: no idea Treated with: Oral agents Blood sugar tested every day: No Genitourinary Medical History: Negative for: End Stage Renal Disease Immunological Medical History: Negative for: Lupus Erythematosus; Raynaudos; Scleroderma Integumentary (Skin) Medical History: Negative for: History of Burn; History of pressure wounds Musculoskeletal Medical History: Positive for: Osteoarthritis Negative for: Gout; Rheumatoid Arthritis; Osteomyelitis Neurologic Medical History: Negative for: Dementia; Neuropathy; Quadriplegia; Paraplegia; Seizure Disorder Oncologic Medical History: Negative for: Received Chemotherapy; Received Radiation HBO Extended History Items Eyes: Cataracts Immunizations Pneumococcal Vaccine: Received Pneumococcal Vaccination: Yes Implantable Devices No devices added Family and Social History Cancer: No; Diabetes: Yes - Father,Siblings,Child; Heart Disease: No; Hypertension: Yes - Child; Kidney Disease: No; Lung Disease: Yes - Child; Seizures: No; Stroke: Yes - Mother; Thyroid Problems: No; Former smoker; Marital Status - Single; Alcohol Use: Never; Drug Use: No History; Caffeine Use: Daily; Financial Concerns: No; Food, Clothing or Shelter Needs: No; Support System Lacking: No; Transportation Concerns: No Physician Affirmation I have reviewed and agree with the above information. [name], WILHELMI ([id number]) Electronic Signature(s) Signed: 10/01/2018 4:40:23 PM By: Harold Barban Signed: 10/03/2018 12:14:37 AM By: Worthy Keeler PA-C Entered By: Worthy Keeler on 10/01/2018 13:04:32 Aguirre, [name] ([id number]) -------------------------------------------------------------------------------- SuperBill Details Patient Name: Lech, Danicia C. Date of Service:  10/01/2018 Medical Record Number: [id number] Patient Account Number: [id number] Date of Birth/Sex: [birth year] (85 y.o. F) Treating RN: Harold Barban Primary Care Provider: Ria Bush Other Clinician: Referring Provider: Ria Bush Treating Provider/Extender: Melburn Hake, Wrenley Sayed Weeks in Treatment: 27 Diagnosis Coding ICD-10 Codes Code Description I87.331 Chronic venous hypertension (idiopathic) with ulcer and inflammation of right lower extremity E11.622 Type 2 diabetes mellitus with other skin ulcer L97.812 Non-pressure chronic ulcer of other part of right lower leg with fat layer exposed L03.115 Cellulitis of right lower limb Facility Procedures CPT4 Code Description: [id number] 11042 - DEB SUBQ TISSUE 20 SQ CM/< ICD-10 Diagnosis Description P79.480 Non-pressure chronic ulcer of other part of right lower leg wi Modifier: th fat layer expo Quantity: 1 sed Physician Procedures CPT4 Code Description: [id number] 82707 - WC PHYS SUBQ TISS 20 SQ CM ICD-10 Diagnosis Description E67.544 Non-pressure chronic ulcer of other part of right lower leg wi Modifier: th fat layer expo Quantity: 1 sed Electronic Signature(s) Signed: 10/03/2018 12:14:37 AM By: Worthy Keeler PA-C Entered By: Worthy Keeler on 10/01/2018 13:06:37

## 2018-10-03 NOTE — Progress Notes (Signed)
WILMINA, MAXHAM ([id number]) Visit Report for 10/01/2018 Arrival Information Details Patient Name: Carnegie, [name] Date of Service: 10/01/2018 12:30 PM Medical Record Number: [id number] Patient Account Number: [id number] Date of Birth/Sex: 13-Dec-1927 (85 y.o. F) Treating RN: Cornell Barman Primary Care Truda Staub: Ria Bush Other Clinician: Referring Kyleena Scheirer: Ria Bush Treating Mekaela Azizi/Extender: Melburn Hake, HOYT Weeks in Treatment: 93 Visit Information History Since Last Visit Added or deleted any medications: No Patient Arrived: Walker Any new allergies or adverse reactions: No Arrival Time: 12:36 Had a fall or experienced change in No Accompanied By: self activities of daily living that may affect Transfer Assistance: None risk of falls: Signs or symptoms of abuse/neglect since last visito No Hospitalized since last visit: No Has Dressing in Place as Prescribed: Yes Pain Present Now: Yes Electronic Signature(s) Signed: 10/01/2018 4:46:34 PM By: Gretta Cool, BSN, RN, CWS, Kim RN, BSN Entered By: Gretta Cool, BSN, RN, CWS, Kim on 10/01/2018 12:37:03 Porto, [name] ([id number]) -------------------------------------------------------------------------------- Encounter Discharge Information Details Patient Name: Fusco, Jearline C. Date of Service: 10/01/2018 12:30 PM Medical Record Number: [id number] Patient Account Number: [id number] Date of Birth/Sex: 03/24/1928 (85 y.o. F) Treating RN: Harold Barban Primary Care Mikenna Bunkley: Ria Bush Other Clinician: Referring Yesena Reaves: Ria Bush Treating Klare Criss/Extender: Melburn Hake, HOYT Weeks in Treatment: 27 Encounter Discharge Information Items Post Procedure Vitals Discharge Condition: Stable Temperature (F): 98.5 Ambulatory Status: Walker Pulse (bpm): 75 Discharge Destination: Home Respiratory Rate (breaths/min): 16 Transportation: Private Auto Blood Pressure (mmHg): 123/48 Accompanied By: family Schedule  Follow-up Appointment: Yes Clinical Summary of Care: Electronic Signature(s) Signed: 10/01/2018 2:58:07 PM By: Harold Barban Entered By: Harold Barban on 10/01/2018 14:58:06 Goodie, Lyndia CMarland Kitchen ([id number]) -------------------------------------------------------------------------------- Lower Extremity Assessment Details Patient Name: Corprew, Rhilee C. Date of Service: 10/01/2018 12:30 PM Medical Record Number: [id number] Patient Account Number: [id number] Date of Birth/Sex: 11-28-1927 (85 y.o. F) Treating RN: Cornell Barman Primary Care Maheen Cwikla: Ria Bush Other Clinician: Referring Tyjah Hai: Ria Bush Treating Vernis Eid/Extender: STONE III, HOYT Weeks in Treatment: 27 Edema Assessment Assessed: [Left: No] [Right: No] Edema: [Left: N] [Right: o] Vascular Assessment Pulses: Dorsalis Pedis Palpable: [Right:Yes] Electronic Signature(s) Signed: 10/01/2018 4:46:34 PM By: Gretta Cool, BSN, RN, CWS, Kim RN, BSN Entered By: Gretta Cool, BSN, RN, CWS, Kim on 10/01/2018 12:44:09 Knoles, [name] ([id number]) -------------------------------------------------------------------------------- Multi Wound Chart Details Patient Name: Samet, Jillianna C. Date of Service: 10/01/2018 12:30 PM Medical Record Number: [id number] Patient Account Number: [id number] Date of Birth/Sex: [birth year] (85 y.o. F) Treating RN: Harold Barban Primary Care Anthea Udovich: Ria Bush Other Clinician: Referring Kare Dado: Ria Bush Treating Marquise Lambson/Extender: STONE III, HOYT Weeks in Treatment: 27 Vital Signs Height(in): 58 Pulse(bpm): 75 Weight(lbs): 140 Blood Pressure(mmHg): 123/48 Body Mass Index(BMI): 29 Temperature(F): 98.5 Respiratory Rate 16 (breaths/min): Photos: [N/A:N/A] Wound Location: Right Lower Leg - Lateral N/A N/A Wounding Event: Trauma N/A N/A Primary Etiology: Diabetic Wound/Ulcer of the N/A N/A Lower Extremity Comorbid History: Cataracts, Anemia, N/A N/A Arrhythmia, Coronary  Artery Disease, Hypertension, Myocardial Infarction, Type II Diabetes, Osteoarthritis Date Acquired: 03/09/2018 N/A N/A Weeks of Treatment: 27 N/A N/A Wound Status: Open N/A N/A Measurements L x W x D 1x0.5x0.1 N/A N/A (cm) Area (cm) : 0.393 N/A N/A Volume (cm) : 0.039 N/A N/A % Reduction in Area: 79.20% N/A N/A % Reduction in Volume: 93.10% N/A N/A Classification: Grade 2 N/A N/A Exudate Amount: Medium N/A N/A Exudate Type: Serous N/A N/A Exudate Color: amber N/A N/A Wound Margin: Flat and Intact N/A N/A Granulation Amount: Small (1-33%) N/A N/A Granulation Quality: Pink N/A N/A Necrotic Amount: Large (67-100%) N/A  N/A Necrotic Tissue: Eschar, Adherent Slough N/A N/A Exposed Structures: Fat Layer (Subcutaneous N/A N/A Tissue) Exposed: Yes Schmoll, Dameshia C. ([id number]) Fascia: No Tendon: No Muscle: No Joint: No Bone: No Epithelialization: Small (1-33%) N/A N/A Treatment Notes Electronic Signature(s) Signed: 10/01/2018 4:40:23 PM By: Harold Barban Entered By: Harold Barban on 10/01/2018 12:50:21 Shinault, [name] ([id number]) -------------------------------------------------------------------------------- [place] Details Patient Name: Simmerman, Nicolena C. Date of Service: 10/01/2018 12:30 PM Medical Record Number: [id number] Patient Account Number: [id number] Date of Birth/Sex: 1927-06-08 (85 y.o. F) Treating RN: Harold Barban Primary Care Cruz Devilla: Ria Bush Other Clinician: Referring Osborne Serio: Ria Bush Treating Kenyan Karnes/Extender: Melburn Hake, HOYT Weeks in Treatment: 33 Active Inactive Abuse / Safety / Falls / Self Care Management Nursing Diagnoses: Potential for falls Goals: Patient will not experience any injury related to falls Date Initiated: 03/23/2018 Target Resolution Date: 06/09/2018 Goal Status: Active Interventions: Assess fall risk on admission and as needed Notes: Orientation to the Wound Care  Program Nursing Diagnoses: Knowledge deficit related to the wound healing center program Goals: Patient/caregiver will verbalize understanding of the [facility] Program Date Initiated: 03/23/2018 Target Resolution Date: 06/09/2018 Goal Status: Active Interventions: Provide education on orientation to the wound center Notes: Pain, Acute or Chronic Nursing Diagnoses: Pain, acute or chronic: actual or potential Goals: Patient will verbalize adequate pain control and receive pain control interventions during procedures as needed Date Initiated: 03/23/2018 Target Resolution Date: 06/09/2018 Goal Status: Active Interventions: Assess comfort goal upon admission Tripathi, Azura C. ([id number]) Notes: Wound/Skin Impairment Nursing Diagnoses: Impaired tissue integrity Goals: Ulcer/skin breakdown will heal within 14 weeks Date Initiated: 03/23/2018 Target Resolution Date: 06/09/2018 Goal Status: Active Interventions: Assess patient/caregiver ability to obtain necessary supplies Assess patient/caregiver ability to perform ulcer/skin care regimen upon admission and as needed Assess ulceration(s) every visit Notes: Electronic Signature(s) Signed: 10/01/2018 4:40:23 PM By: Harold Barban Entered By: Harold Barban on 10/01/2018 12:50:12 Bufkin, Garrett C. ([id number]) -------------------------------------------------------------------------------- Pain Assessment Details Patient Name: Raupp, Kamyah C. Date of Service: 10/01/2018 12:30 PM Medical Record Number: [id number] Patient Account Number: [id number] Date of Birth/Sex: Nov 03, 1927 (85 y.o. F) Treating RN: Cornell Barman Primary Care Les Longmore: Ria Bush Other Clinician: Referring Novalee Horsfall: Ria Bush Treating Sheila Gervasi/Extender: Melburn Hake, HOYT Weeks in Treatment: 27 Active Problems Location of Pain Severity and Description of Pain Patient Has [name] Yes Site Locations Pain Location: Generalized Pain, Pain in  Ulcers With Dressing Change: Yes Rate the pain. Current Pain Level: 8 Pain Management and Medication Current Pain Management: Electronic Signature(s) Signed: 10/01/2018 4:46:34 PM By: Gretta Cool, BSN, RN, CWS, Kim RN, BSN Entered By: Gretta Cool, BSN, RN, CWS, Kim on 10/01/2018 12:37:14 Vickers, [name] ([id number]) -------------------------------------------------------------------------------- Patient/Caregiver Education Details Patient Name: Emanuelson, [name] Date of Service: 10/01/2018 12:30 PM Medical Record Number: [id number] Patient Account Number: [id number] Date of Birth/Gender: 12/24/1927 (85 y.o. F) Treating RN: Harold Barban Primary Care Physician: Ria Bush Other Clinician: Referring Physician: Ria Bush Treating Physician/Extender: Sharalyn Ink in Treatment: 43 Education Assessment Education Provided To: Patient Education Topics Provided Wound/Skin Impairment: Handouts: Caring for Your Ulcer Methods: Demonstration, Explain/Verbal Responses: State content correctly Electronic Signature(s) Signed: 10/01/2018 4:40:23 PM By: Harold Barban Entered By: Harold Barban on 10/01/2018 12:50:48 Pfohl, Ajeenah C. ([id number]) -------------------------------------------------------------------------------- Wound Assessment Details Patient Name: Menna, Britzy C. Date of Service: 10/01/2018 12:30 PM Medical Record Number: [id number] Patient Account Number: [id number] Date of Birth/Sex: [birth year] (85 y.o. F) Treating RN: Cornell Barman Primary Care Jivan Symanski: Ria Bush Other Clinician: Referring Renatha Rosen: Ria Bush Treating Bralynn Donado/Extender: Melburn Hake,  HOYT Weeks in Treatment: 27 Wound Status Wound Number: 8 Primary Diabetic Wound/Ulcer of the Lower Extremity Etiology: Wound Location: Right Lower Leg - Lateral Wound Open Wounding Event: Trauma Status: Date Acquired: 03/09/2018 Comorbid Cataracts, Anemia, Arrhythmia, Coronary Artery Weeks Of  Treatment: 27 History: Disease, Hypertension, Myocardial Infarction, Clustered Wound: No Type II Diabetes, Osteoarthritis Photos Wound Measurements Length: (cm) 1 Width: (cm) 0.5 Depth: (cm) 0.1 Area: (cm) 0.393 Volume: (cm) 0.039 % Reduction in Area: 79.2% % Reduction in Volume: 93.1% Epithelialization: Small (1-33%) Tunneling: No Undermining: No Wound Description Classification: Grade 2 Foul Odor A Wound Margin: Flat and Intact Slough/Fibr Exudate Amount: Medium Exudate Type: Serous Exudate Color: amber fter Cleansing: No ino Yes Wound Bed Granulation Amount: Small (1-33%) Exposed Structure Granulation Quality: Pink Fascia Exposed: No Necrotic Amount: Large (67-100%) Fat Layer (Subcutaneous Tissue) Exposed: Yes Necrotic Quality: Eschar, Adherent Slough Tendon Exposed: No Muscle Exposed: No Joint Exposed: No Bone Exposed: No Treatment Notes Steffy, Elonda C. ([id number]) Wound #8 (Right, Lateral Lower Leg) Notes Siver[place], conform. Patient refused Pensions consultant) Signed: 10/01/2018 4:46:34 PM By: Gretta Cool, BSN, RN, CWS, Kim RN, BSN Entered By: Gretta Cool, BSN, RN, CWS, Kim on 10/01/2018 12:43:43 Hietpas, [name] ([id number]) -------------------------------------------------------------------------------- [place] Details Patient Name: Simien, Livianna C. Date of Service: 10/01/2018 12:30 PM Medical Record Number: [id number] Patient Account Number: [id number] Date of Birth/Sex: 1927/10/05 (85 y.o. F) Treating RN: Cornell Barman Primary Care Karry Causer: Ria Bush Other Clinician: Referring Corbitt Cloke: Ria Bush Treating Gerrett Loman/Extender: Melburn Hake, HOYT Weeks in Treatment: 27 Vital Signs Time Taken: 12:37 Temperature (F): 98.5 Height (in): 58 Pulse (bpm): 75 Weight (lbs): 140 Respiratory Rate (breaths/min): 16 Body Mass Index (BMI): 29.3 Blood Pressure (mmHg): 123/48 Reference Range: 80 - 120 mg / dl Electronic Signature(s) Signed:  10/01/2018 4:46:34 PM By: Gretta Cool, BSN, RN, CWS, Kim RN, BSN Entered By: Gretta Cool, BSN, RN, CWS, Kim on 10/01/2018 12:39:10

## 2018-10-05 DIAGNOSIS — I10 Essential (primary) hypertension: Secondary | ICD-10-CM | POA: Diagnosis not present

## 2018-10-05 DIAGNOSIS — L97812 Non-pressure chronic ulcer of other part of right lower leg with fat layer exposed: Secondary | ICD-10-CM | POA: Diagnosis not present

## 2018-10-05 DIAGNOSIS — L03115 Cellulitis of right lower limb: Secondary | ICD-10-CM | POA: Diagnosis not present

## 2018-10-05 DIAGNOSIS — E119 Type 2 diabetes mellitus without complications: Secondary | ICD-10-CM | POA: Diagnosis not present

## 2018-10-05 DIAGNOSIS — I87331 Chronic venous hypertension (idiopathic) with ulcer and inflammation of right lower extremity: Secondary | ICD-10-CM | POA: Diagnosis not present

## 2018-10-05 DIAGNOSIS — I2510 Atherosclerotic heart disease of native coronary artery without angina pectoris: Secondary | ICD-10-CM | POA: Diagnosis not present

## 2018-10-08 ENCOUNTER — Other Ambulatory Visit: Payer: Self-pay

## 2018-10-08 ENCOUNTER — Encounter: Payer: Medicare Other | Attending: Physician Assistant | Admitting: Physician Assistant

## 2018-10-08 DIAGNOSIS — I87331 Chronic venous hypertension (idiopathic) with ulcer and inflammation of right lower extremity: Secondary | ICD-10-CM | POA: Diagnosis not present

## 2018-10-08 DIAGNOSIS — L97812 Non-pressure chronic ulcer of other part of right lower leg with fat layer exposed: Secondary | ICD-10-CM | POA: Insufficient documentation

## 2018-10-08 DIAGNOSIS — Z87891 Personal history of nicotine dependence: Secondary | ICD-10-CM | POA: Insufficient documentation

## 2018-10-08 DIAGNOSIS — L03115 Cellulitis of right lower limb: Secondary | ICD-10-CM | POA: Insufficient documentation

## 2018-10-08 DIAGNOSIS — E11622 Type 2 diabetes mellitus with other skin ulcer: Secondary | ICD-10-CM | POA: Insufficient documentation

## 2018-10-09 ENCOUNTER — Encounter (INDEPENDENT_AMBULATORY_CARE_PROVIDER_SITE_OTHER): Payer: Self-pay | Admitting: Vascular Surgery

## 2018-10-09 ENCOUNTER — Ambulatory Visit (INDEPENDENT_AMBULATORY_CARE_PROVIDER_SITE_OTHER): Payer: Medicare Other | Admitting: Vascular Surgery

## 2018-10-09 ENCOUNTER — Other Ambulatory Visit: Payer: Self-pay | Admitting: Gynecology

## 2018-10-09 VITALS — BP 135/64 | HR 60 | Resp 18 | Ht <= 58 in | Wt 139.0 lb

## 2018-10-09 DIAGNOSIS — I10 Essential (primary) hypertension: Secondary | ICD-10-CM | POA: Diagnosis not present

## 2018-10-09 DIAGNOSIS — Z79899 Other long term (current) drug therapy: Secondary | ICD-10-CM | POA: Diagnosis not present

## 2018-10-09 DIAGNOSIS — E1149 Type 2 diabetes mellitus with other diabetic neurological complication: Secondary | ICD-10-CM

## 2018-10-09 DIAGNOSIS — E1151 Type 2 diabetes mellitus with diabetic peripheral angiopathy without gangrene: Secondary | ICD-10-CM | POA: Diagnosis not present

## 2018-10-09 DIAGNOSIS — E785 Hyperlipidemia, unspecified: Secondary | ICD-10-CM | POA: Diagnosis not present

## 2018-10-09 DIAGNOSIS — L97919 Non-pressure chronic ulcer of unspecified part of right lower leg with unspecified severity: Secondary | ICD-10-CM

## 2018-10-09 DIAGNOSIS — Z7984 Long term (current) use of oral hypoglycemic drugs: Secondary | ICD-10-CM | POA: Diagnosis not present

## 2018-10-09 DIAGNOSIS — I70239 Atherosclerosis of native arteries of right leg with ulceration of unspecified site: Secondary | ICD-10-CM | POA: Diagnosis not present

## 2018-10-09 DIAGNOSIS — Z87891 Personal history of nicotine dependence: Secondary | ICD-10-CM

## 2018-10-09 DIAGNOSIS — I7025 Atherosclerosis of native arteries of other extremities with ulceration: Secondary | ICD-10-CM

## 2018-10-09 NOTE — Assessment & Plan Note (Signed)
Perfusion appears to be markedly improved after intervention last month.  Plan to check with noninvasive studies in 2 to 3 months.  Continue local wound care with the wound care center.

## 2018-10-09 NOTE — Progress Notes (Signed)
MRN : [id number]  [name] is a 85 y.o. ([birth year]) female who presents with chief complaint of  Chief Complaint  Patient presents with  . Follow-up  .  History of Present Illness: Patient returns today in follow up of PAD with ulceration of the right leg.  She had no periprocedural complications after a right leg intervention about a month ago.  The wound is improving and she continues to follow with the wound care center.  Her access site is well-healed.  She did not get a lot of swelling.  Her pain is improved.  Current Outpatient Medications  Medication Sig Dispense Refill  . acetaminophen (TYLENOL) 500 MG tablet Take 500 mg by mouth every 6 (six) hours as needed for mild pain.    Marland Kitchen ALPRAZolam (XANAX) 1 MG tablet Take 0.5 mg by mouth 4 (four) times daily as needed for anxiety.     Marland Kitchen aspirin EC 81 MG tablet Take 81 mg by mouth daily.    . collagenase (SANTYL) ointment Apply topically daily. 15 g 0  . diclofenac sodium (VOLTAREN) 1 % GEL Apply 1 application topically 3 (three) times daily. 1 Tube 1  . famotidine (PEPCID) 20 MG tablet Take 1 tablet (20 mg total) by mouth at bedtime.    . furosemide (LASIX) 20 MG tablet TAKE 0.5 TABLETS (10 MG TOTAL) BY MOUTH DAILY. 45 tablet 3  . glucosamine-chondroitin 500-400 MG tablet Take 1 tablet by mouth daily.     Marland Kitchen glucose blood (ONE TOUCH ULTRA TEST) test strip USE AS INSTRUCTED TO CHECK BLOOD SUGAR ONCE A DAY Dx code: E11.9 50 each 5  . HYDROcodone-acetaminophen (NORCO/VICODIN) 5-325 MG tablet Take 0.5 tablets by mouth every 6 (six) hours as needed for moderate pain. 60 tablet 0  . loperamide (IMODIUM) 2 MG capsule Take 2 mg by mouth 4 (four) times daily as needed for diarrhea or loose stools.    Marland Kitchen loratadine (CLARITIN) 10 MG tablet Take 10 mg by mouth daily as needed for allergies.    . metoprolol tartrate (LOPRESSOR) 25 MG tablet TAKE ONE AND HALF TABLETS (37.5 MG TOTAL) BY MOUTH 2 (TWO) TIMES DAILY. 270 tablet 1  . Multiple Vitamin  (MULTIVITAMIN) tablet Take 1 tablet by mouth daily. Patient reports takes a new vitamin to replace her centrum as recommended by her eye doctor.    . nitroGLYCERIN (NITROSTAT) 0.4 MG SL tablet Place 1 tablet (0.4 mg total) under the tongue every 5 (five) minutes as needed for chest pain. 25 tablet 3  . nystatin cream (MYCOSTATIN) Apply 1 application topically 2 (two) times daily. 45 g 0  . nystatin-triamcinolone ointment (MYCOLOG) APPLY 1 APPLICATION TOPICALLY 2 (TWO) TIMES DAILY. AS NEEDED FOR ITCHING 60 g 1  . ONETOUCH VERIO test strip USE TO TEST BLOOD SUGAR ONCE DAILY 100 each 6  . pantoprazole (PROTONIX) 40 MG tablet TAKE 1 TABLET BY MOUTH EVERY DAY 30 tablet 3  . pioglitazone (ACTOS) 30 MG tablet TAKE 1 TABLET BY MOUTH EVERY DAY 90 tablet 3  . polyethylene glycol powder (GLYCOLAX/MIRALAX) powder Take 17 g by mouth daily. Hold for diarrhea 3350 g 1  . pravastatin (PRAVACHOL) 20 MG tablet TAKE 1 TABLET BY MOUTH EVERY DAY 90 tablet 3  . Probiotic Product (ALIGN) 4 MG CAPS Take 1 capsule (4 mg total) by mouth daily.    . ramipril (ALTACE) 5 MG capsule TAKE ONE CAPSULE BY MOUTH EVERYDAY FOR BLOOD PRESSURE 90 capsule 1  . senna-docusate (SENOKOT-S) 8.6-50 MG  tablet Take 1 tablet by mouth daily as needed for moderate constipation. 30 tablet 0  . sitaGLIPtin (JANUVIA) 100 MG tablet Take 1 tablet (100 mg total) by mouth daily. (Patient taking differently: Take 100 mg by mouth daily. Takes 1/2 tablet) 30 tablet 2  . traZODone (DESYREL) 150 MG tablet Take 150 mg by mouth at bedtime.     . triamcinolone cream (KENALOG) 0.1 % APPLY TO AFFECTED AREA TWICE A DAY 80 g 0  . vitamin B-12 (CYANOCOBALAMIN) 1000 MCG tablet Take 1,000 mcg by mouth daily.    . vitamin E 400 UNIT capsule Take 400 Units by mouth daily.     No current facility-administered medications for this visit.     Past Medical History:  Diagnosis Date  . Acute deep vein thrombosis (DVT) of distal vein of right lower extremity ([place]) 12/2013    Acute thrombus of R proximal gastrocnemius vein. Symptomatic provoked distal DVT  . Anxiety   . Cellulitis of leg, left 01/16/2014  . Chronic back pain 03/2012   lumbar DDD with herniation - s/p L S1 transforaminal ESI (10/2012 Dr. Sharlet Salina)  . Chronic venous insufficiency   . Coronary artery disease    CABG 1998  . Cystocele   . Depression   . Diabetes mellitus    type 2, followed by endo.  . Diastolic dysfunction   . Diverticulosis   . Hemorrhoids   . History of heart attack   . Hx of echocardiogram    a. Echo 10/13: mild LVH, EF 55-60%, Gr 1 diast dysfn, PASP 32  . Hyperlipidemia   . Hypertension   . Irritable bowel syndrome   . Microcytic anemia   . PAD (peripheral artery disease) ([place]) 2013   ABI: R 0.91, L 0.83  . PUD (peptic ulcer disease)   . Varicose veins   . Venous ulcer of leg ([name]) 12/12/2013   Required prolonged treatment by wound center  . Viral gastroenteritis due to [place] virus 05/13/2015   hospitalization    Past Surgical History:  Procedure Laterality Date  . ABDOMINAL HYSTERECTOMY  1975   TAH,BSO  . ABI  2013   R 0.91, L 0.83  . APPENDECTOMY     per pt report  . CARDIOVASCULAR STRESS TEST  2015   low risk myoview ([place])  . CATARACT SURGERY    . CHOLECYSTECTOMY    . COLONOSCOPY  02/2010   extensive diverticulosis throughout, small internal hemorrhoids, rec rpt 5 yrs (Dr. Allyn Kenner)  . CORONARY ARTERY BYPASS GRAFT  1998  . dexa scan  09/2011   femur -0.8, forearm 0.6 => normal  . ENDOVENOUS ABLATION SAPHENOUS VEIN W/ LASER Left 03/2014   Kellie Simmering  . ESI  2014   L S1 transforaminal ESI x2 ([place]) without improvement  . HAND SURGERY    . lexiscan myoview  03/2011   Small basal inferolateral and anterolateral reversible perfusion defect suggests ischemia.  EF was normal.   old infarct, no new ischemia  . LOWER EXTREMITY ANGIOGRAPHY Right 09/06/2018   Procedure: LOWER EXTREMITY ANGIOGRAPHY;  Surgeon: Algernon Huxley, MD;  Location: [facility];   Service: Cardiovascular;  Laterality: Right;  . toe surgery    . TYMPANOPLASTY  1960s   R side  . Vault prolapse A & P repair  2009   Dr Rhodia Albright [facility] hospital    Social History Social History   Tobacco Use  . Smoking status: Former Smoker    Quit date: 03/05/1979  Years since quitting: 39.6  . Smokeless tobacco: Never Used  Substance Use Topics  . Alcohol use: No    Alcohol/week: 0.0 standard drinks  . Drug use: No     Family History Family History  Problem Relation Age of Onset  . Hypertension Mother   . Stroke Mother   . Diabetes Father   . Diabetes Sister   . Diabetes Brother   . Heart disease Son   . Colon cancer Neg Hx      Allergies  Allergen Reactions  . Metformin And Related Diarrhea     REVIEW OF SYSTEMS (Negative unless checked)  Constitutional: [] ?Weight loss  [] ?Fever  [] ?Chills Cardiac: [] ?Chest pain   [] ?Chest pressure   [] ?Palpitations   [] ?Shortness of breath when laying flat   [] ?Shortness of breath at rest   [x] ?Shortness of breath with exertion. Vascular:  [] ?Pain in legs with walking   [] ?Pain in legs at rest   [] ?Pain in legs when laying flat   [] ?Claudication   [] ?Pain in feet when walking  [] ?Pain in feet at rest  [] ?Pain in feet when laying flat   [] ?History of DVT   [] ?Phlebitis   [x] ?Swelling in legs   [x] ?Varicose veins   [x] ?Non-healing ulcers Pulmonary:   [] ?Uses home oxygen   [] ?Productive cough   [] ?Hemoptysis   [] ?Wheeze  [] ?COPD   [] ?Asthma Neurologic:  [] ?Dizziness  [] ?Blackouts   [] ?Seizures   [] ?History of stroke   [] ?History of TIA  [] ?Aphasia   [] ?Temporary blindness   [] ?Dysphagia   [] ?Weakness or numbness in arms   [] ?Weakness or numbness in legs Musculoskeletal:  [x] ?Arthritis   [] ?Joint swelling   [x] ?Joint pain   [] ?Low back pain Hematologic:  [] ?Easy bruising  [] ?Easy bleeding   [] ?Hypercoagulable state   [x] ?Anemic  [] ?Hepatitis Gastrointestinal:  [] ?Blood in stool   [] ?Vomiting blood  [] ?Gastroesophageal  reflux/heartburn   [] ?Abdominal pain Genitourinary:  [] ?Chronic kidney disease   [] ?Difficult urination  [] ?Frequent urination  [] ?Burning with urination   [] ?Hematuria Skin:  [] ?Rashes   [x] ?Ulcers   [x] ?Wounds Psychological:  [x] ?History of anxiety   [] ? History of major depression.  Physical Examination  BP 135/64 (BP Location: Right Arm)   Pulse 60   Resp 18   Ht 4\' 9"  (1.448 m)   Wt 139 lb (63 kg)   BMI 30.08 kg/m  Gen:  WD/WN, NAD.  Appears younger than stated age Head: [place]/AT, No temporalis wasting. Ear/Nose/Throat: Hearing grossly intact, nares w/o erythema or drainage Eyes: Conjunctiva clear. Sclera non-icteric Neck: Supple.  Trachea midline Pulmonary:  Good air movement, no use of accessory muscles.  Cardiac: Somewhat irregular Vascular:  Vessel Right Left  Radial Palpable Palpable                          PT  2+ palpable  1+ palpable  DP  trace palpable  1+ palpable    Musculoskeletal: M/S 5/5 throughout.  No deformity or atrophy.  No significant lower extremity edema. Neurologic: Sensation grossly intact in extremities.  Symmetrical.  Speech is fluent.  Psychiatric: Judgment intact, Mood & affect appropriate for pt's clinical situation. Dermatologic: Right leg wound currently dressed       Labs Recent Results (from the past 2160 hour(s))  Novel Coronavirus, NAA (hospital order; send-out to ref lab)     Status: None   Collection Time: 09/03/18 11:48 AM   Specimen: Nasopharyngeal Swab; Respiratory  Result Value Ref Range   SARS-CoV-2, NAA NOT  DETECTED NOT DETECTED    Comment: (NOTE) This test was developed and its performance characteristics determined by [employer]. This test has not been FDA cleared or approved. This test has been authorized by FDA under an Emergency Use Authorization (EUA). This test is only authorized for the duration of time the declaration that circumstances exist justifying the authorization of the emergency use of in  vitro diagnostic tests for detection of SARS-CoV-2 virus and/or diagnosis of COVID-19 infection under section 564(b)(1) of the Act, 21 U.S.C. 443XVQ-0(G)(8), unless the authorization is terminated or revoked sooner. When diagnostic testing is negative, the possibility of a false negative result should be considered in the context of a patient's recent exposures and the presence of clinical signs and symptoms consistent with COVID-19. An individual without symptoms of COVID-19 and who is not shedding SARS-CoV-2 virus would expect to have a negative (not detected) result in this assay. Performed  At: [facility] [place], [place] [id number] Rush Farmer MD OI:[id number]    Coronavirus Source NASOPHARYNGEAL     Comment: Performed at [facility], [address] [place], [place] [address]  POCT Glucose (CBG)     Status: None   Collection Time: 09/04/18  1:23 PM  Result Value Ref Range   POC Glucose 92 70 - 99 mg/dl  POCT HgB A1C     Status: Abnormal   Collection Time: 09/04/18  1:24 PM  Result Value Ref Range   Hemoglobin A1C 6.7 (A) 4.0 - 5.6 %   HbA1c POC (<> result, manual entry)     HbA1c, POC (prediabetic range)     HbA1c, POC (controlled diabetic range)    Lipid panel     Status: None   Collection Time: 09/04/18  1:46 PM  Result Value Ref Range   Cholesterol 122 0 - 200 mg/dL    Comment: ATP III Classification       Desirable:  < 200 mg/dL               Borderline High:  200 - 239 mg/dL          High:  > = 240 mg/dL   Triglycerides 104.0 0.0 - 149.0 mg/dL    Comment: Normal:  <150 mg/dLBorderline High:  150 - 199 mg/dL   HDL 50.20 >39.00 mg/dL   VLDL 20.8 0.0 - 40.0 mg/dL   LDL Cholesterol 51 0 - 99 mg/dL   Total CHOL/HDL Ratio 2     Comment:                Men          Women1/2 Average Risk     3.4          3.3Average Risk          5.0          4.42X Average Risk          9.6          7.13X Average Risk          15.0          11.0                        NonHDL 71.40     Comment: NOTE:  Non-HDL goal should be 30 mg/dL higher than patient's LDL goal (i.e. LDL goal of < 70 mg/dL, would have non-HDL goal of < 100 mg/dL)  Urinalysis, Routine w reflex microscopic  Status: None   Collection Time: 09/04/18  1:46 PM  Result Value Ref Range   Color, Urine YELLOW Yellow;Lt. Yellow;Straw;Dark Yellow;Amber;Green;Red;Brown   APPearance CLEAR Clear;Turbid;Slightly Cloudy;Cloudy   Specific Gravity, Urine 1.020 1.000 - 1.030   pH 5.5 5.0 - 8.0   Total Protein, Urine NEGATIVE Negative   Urine Glucose NEGATIVE Negative   Ketones, ur NEGATIVE Negative   Bilirubin Urine NEGATIVE Negative   Hgb urine dipstick NEGATIVE Negative   Urobilinogen, UA 0.2 0.0 - 1.0   Leukocytes,Ua NEGATIVE Negative   Nitrite NEGATIVE Negative   WBC, UA none seen 0-2/hpf   RBC / HPF none seen 0-2/hpf   Squamous Epithelial / LPF Rare(0-4/hpf) Rare(0-4/hpf)  Microalbumin / creatinine urine ratio     Status: None   Collection Time: 09/04/18  1:46 PM  Result Value Ref Range   Microalb, Ur <0.7 0.0 - 1.9 mg/dL   Creatinine,U 45.9 mg/dL   Microalb Creat Ratio 1.5 0.0 - 30.0 mg/g  TSH     Status: None   Collection Time: 09/04/18  1:46 PM  Result Value Ref Range   TSH 2.63 0.35 - 4.50 uIU/mL  Comprehensive metabolic panel     Status: Abnormal   Collection Time: 09/04/18  1:46 PM  Result Value Ref Range   Sodium 138 135 - 145 mEq/L   Potassium 4.6 3.5 - 5.1 mEq/L   Chloride 101 96 - 112 mEq/L   CO2 33 (H) 19 - 32 mEq/L   Glucose, Bld 86 70 - 99 mg/dL   BUN 20 6 - 23 mg/dL   Creatinine, Ser 0.86 0.40 - 1.20 mg/dL   Total Bilirubin 0.4 0.2 - 1.2 mg/dL   Alkaline Phosphatase 114 39 - 117 U/L   AST 20 0 - 37 U/L   ALT 15 0 - 35 U/L   Total Protein 6.4 6.0 - 8.3 g/dL   Albumin 3.7 3.5 - 5.2 g/dL   Calcium 9.2 8.4 - 10.5 mg/dL   GFR 62.10 >60.00 mL/min  Glucose, capillary     Status: Abnormal   Collection Time: 09/06/18  8:25 AM  Result Value Ref Range    Glucose-Capillary 107 (H) 70 - 99 mg/dL  Glucose, capillary     Status: Abnormal   Collection Time: 09/06/18 10:59 AM  Result Value Ref Range   Glucose-Capillary 127 (H) 70 - 99 mg/dL    Radiology No results found.  Assessment/Plan Diabetes mellitus type 2 with peripheral artery disease (HCC) blood glucose control important in reducing the progression of atherosclerotic disease. Also, involved in wound healing. On appropriate medications.   Hypertension blood pressure control important in reducing the progression of atherosclerotic disease. On appropriate oral medications.   Hyperlipidemia lipid control important in reducing the progression of atherosclerotic disease. Continue statin therapy  Atherosclerosis of native arteries of the extremities with ulceration ([place]) Perfusion appears to be markedly improved after intervention last month.  Plan to check with noninvasive studies in 2 to 3 months.  Continue local wound care with the wound care center.    Leotis Pain, MD  10/09/2018 3:40 PM    This note was created with Dragon medical transcription system.  Any errors from dictation are purely unintentional

## 2018-10-11 ENCOUNTER — Telehealth: Payer: Self-pay

## 2018-10-11 NOTE — Progress Notes (Signed)
NISHI, NEISWONGER ([id number]) Visit Report for 10/08/2018 Arrival Information Details Patient Name: Slater, Darianna C. Date of Service: 10/08/2018 3:30 PM Medical Record Number: [id number] Patient Account Number: [id number] Date of Birth/Sex: [birth year] (85 y.o. F) Treating RN: Army Melia Primary Care Elzada Pytel: Ria Bush Other Clinician: Referring Aianna Fahs: Ria Bush Treating Vanesa Renier/Extender: Melburn Hake, HOYT Weeks in Treatment: 28 Visit Information History Since Last Visit Added or deleted any medications: No Patient Arrived: Walker Any new allergies or adverse reactions: No Arrival Time: 15:49 Had a fall or experienced change in No Accompanied By: self activities of daily living that may affect Transfer Assistance: None risk of falls: Signs or symptoms of abuse/neglect since last visito No Hospitalized since last visit: No Has Dressing in Place as Prescribed: Yes Pain Present Now: No Electronic Signature(s) Signed: 10/08/2018 4:22:26 PM By: Army Melia Entered By: Army Melia on 10/08/2018 15:49:20 Kartes, [name] ([id number]) -------------------------------------------------------------------------------- Encounter Discharge Information Details Patient Name: Potier, Aleysha C. Date of Service: 10/08/2018 3:30 PM Medical Record Number: [id number] Patient Account Number: [id number] Date of Birth/Sex: [birth year] (85 y.o. F) Treating RN: Army Melia Primary Care Jonea Bukowski: Ria Bush Other Clinician: Referring Zhavia Cunanan: Ria Bush Treating Adeeb Konecny/Extender: Melburn Hake, HOYT Weeks in Treatment: 28 Encounter Discharge Information Items Post Procedure Vitals Discharge Condition: Stable Temperature (F): 98.4 Ambulatory Status: Walker Pulse (bpm): 63 Discharge Destination: Home Respiratory Rate (breaths/min): 16 Transportation: Private Auto Blood Pressure (mmHg): 145/57 Accompanied By: self Schedule Follow-up Appointment: No Clinical Summary of  Care: Electronic Signature(s) Signed: 10/08/2018 4:22:26 PM By: Army Melia Entered By: Army Melia on 10/08/2018 16:21:57 Westervelt, Midori CMarland Kitchen ([id number]) -------------------------------------------------------------------------------- Lower Extremity Assessment Details Patient Name: Chenard, Ayden C. Date of Service: 10/08/2018 3:30 PM Medical Record Number: [id number] Patient Account Number: [id number] Date of Birth/Sex: 1928-01-26 (85 y.o. F) Treating RN: Army Melia Primary Care Joyanne Eddinger: Ria Bush Other Clinician: Referring Vincenzo Stave: Ria Bush Treating Raedyn Klinck/Extender: STONE III, HOYT Weeks in Treatment: 28 Edema Assessment Assessed: [Left: No] [Right: No] Edema: [Left: N] [Right: o] Vascular Assessment Pulses: Dorsalis Pedis Palpable: [Right:Yes] Electronic Signature(s) Signed: 10/08/2018 4:22:26 PM By: Army Melia Entered By: Army Melia on 10/08/2018 15:53:51 Ashurst, Kynzley C. ([id number]) -------------------------------------------------------------------------------- Multi Wound Chart Details Patient Name: Wetherby, Leshonda C. Date of Service: 10/08/2018 3:30 PM Medical Record Number: [id number] Patient Account Number: [id number] Date of Birth/Sex: 11-03-1927 (85 y.o. F) Treating RN: Harold Barban Primary Care Camry Robello: Ria Bush Other Clinician: Referring Emilynn Srinivasan: Ria Bush Treating Cecely Rengel/Extender: STONE III, HOYT Weeks in Treatment: 28 Vital Signs Height(in): 58 Pulse(bpm): 74 Weight(lbs): 140 Blood Pressure(mmHg): 145/51 Body Mass Index(BMI): 29 Temperature(F): 98.4 Respiratory Rate 16 (breaths/min): Photos: [N/A:N/A] Wound Location: Right Lower Leg - Lateral N/A N/A Wounding Event: Trauma N/A N/A Primary Etiology: Diabetic Wound/Ulcer of the N/A N/A Lower Extremity Comorbid History: Cataracts, Anemia, N/A N/A Arrhythmia, Coronary Artery Disease, Hypertension, Myocardial Infarction, Type II Diabetes,  Osteoarthritis Date Acquired: 03/09/2018 N/A N/A Weeks of Treatment: 28 N/A N/A Wound Status: Open N/A N/A Measurements L x W x D 1x0.5x0.1 N/A N/A (cm) Area (cm) : 0.393 N/A N/A Volume (cm) : 0.039 N/A N/A % Reduction in Area: 79.20% N/A N/A % Reduction in Volume: 93.10% N/A N/A Classification: Grade 2 N/A N/A Exudate Amount: Medium N/A N/A Exudate Type: Serous N/A N/A Exudate Color: amber N/A N/A Wound Margin: Flat and Intact N/A N/A Granulation Amount: None Present (0%) N/A N/A Necrotic Amount: Large (67-100%) N/A N/A Necrotic Tissue: Eschar, Adherent Slough N/A N/A Exposed Structures: Fat Layer (Subcutaneous N/A N/A Tissue) Exposed: Yes Fascia: No  Covelli, Kymberlee C. ([id number]) Tendon: No Muscle: No Joint: No Bone: No Epithelialization: Medium (34-66%) N/A N/A Treatment Notes Electronic Signature(s) Signed: 10/08/2018 4:36:57 PM By: Harold Barban Entered By: Harold Barban on 10/08/2018 16:09:55 Parodi, [name] ([id number]) -------------------------------------------------------------------------------- [place] Details Patient Name: Silvernail, Asyria C. Date of Service: 10/08/2018 3:30 PM Medical Record Number: [id number] Patient Account Number: [id number] Date of Birth/Sex: 1927/07/27 (85 y.o. F) Treating RN: Harold Barban Primary Care Chaquita Basques: Ria Bush Other Clinician: Referring Alek Borges: Ria Bush Treating Jaeden Westbay/Extender: Melburn Hake, HOYT Weeks in Treatment: 28 Active Inactive Abuse / Safety / Falls / Self Care Management Nursing Diagnoses: Potential for falls Goals: Patient will not experience any injury related to falls Date Initiated: 03/23/2018 Target Resolution Date: 06/09/2018 Goal Status: Active Interventions: Assess fall risk on admission and as needed Notes: Orientation to the Wound Care Program Nursing Diagnoses: Knowledge deficit related to the wound healing center program Goals: Patient/caregiver  will verbalize understanding of the [facility] Date Initiated: 03/23/2018 Target Resolution Date: 06/09/2018 Goal Status: Active Interventions: Provide education on orientation to the wound center Notes: Pain, Acute or Chronic Nursing Diagnoses: Pain, acute or chronic: actual or potential Goals: Patient will verbalize adequate pain control and receive pain control interventions during procedures as needed Date Initiated: 03/23/2018 Target Resolution Date: 06/09/2018 Goal Status: Active Interventions: Assess comfort goal upon admission Olmo, Dilan C. ([id number]) Notes: Wound/Skin Impairment Nursing Diagnoses: Impaired tissue integrity Goals: Ulcer/skin breakdown will heal within 14 weeks Date Initiated: 03/23/2018 Target Resolution Date: 06/09/2018 Goal Status: Active Interventions: Assess patient/caregiver ability to obtain necessary supplies Assess patient/caregiver ability to perform ulcer/skin care regimen upon admission and as needed Assess ulceration(s) every visit Notes: Electronic Signature(s) Signed: 10/08/2018 4:36:57 PM By: Harold Barban Entered By: Harold Barban on 10/08/2018 16:09:40 Effinger, Lylah C. ([id number]) -------------------------------------------------------------------------------- Pain Assessment Details Patient Name: Pavelko, Magda C. Date of Service: 10/08/2018 3:30 PM Medical Record Number: [id number] Patient Account Number: [id number] Date of Birth/Sex: 08-17-1927 (85 y.o. F) Treating RN: Army Melia Primary Care Pearce Littlefield: Ria Bush Other Clinician: Referring Lloyd Ayo: Ria Bush Treating Ramandeep Arington/Extender: Melburn Hake, HOYT Weeks in Treatment: 28 Active Problems Location of Pain Severity and Description of Pain Patient Has [name] No Site Locations Pain Management and Medication Current Pain Management: Electronic Signature(s) Signed: 10/08/2018 4:22:26 PM By: Army Melia Entered By: Army Melia on  10/08/2018 15:49:26 Cloe, [name] ([id number]) -------------------------------------------------------------------------------- Patient/Caregiver Education Details Patient Name: Genther, Shaneca C. Date of Service: 10/08/2018 3:30 PM Medical Record Number: [id number] Patient Account Number: [id number] Date of Birth/Gender: 09-Oct-1927 (85 y.o. F) Treating RN: Harold Barban Primary Care Physician: Ria Bush Other Clinician: Referring Physician: Ria Bush Treating Physician/Extender: Sharalyn Ink in Treatment: 28 Education Assessment Education Provided To: Patient Education Topics Provided Wound/Skin Impairment: Handouts: Caring for Your Ulcer Methods: Demonstration, Explain/Verbal Responses: State content correctly Electronic Signature(s) Signed: 10/08/2018 4:36:57 PM By: Harold Barban Entered By: Harold Barban on 10/08/2018 16:10:24 Comella, Kirbie Loletha Grayer ([id number]) -------------------------------------------------------------------------------- Wound Assessment Details Patient Name: Lorenzo, Barrie C. Date of Service: 10/08/2018 3:30 PM Medical Record Number: [id number] Patient Account Number: [id number] Date of Birth/Sex: 03/16/1928 (85 y.o. F) Treating RN: Army Melia Primary Care Myelle Poteat: Ria Bush Other Clinician: Referring Trayton Szabo: Ria Bush Treating Ignacia Gentzler/Extender: STONE III, HOYT Weeks in Treatment: 28 Wound Status Wound Number: 8 Primary Diabetic Wound/Ulcer of the Lower Extremity Etiology: Wound Location: Right Lower Leg - Lateral Wound Open Wounding Event: Trauma Status: Date Acquired: 03/09/2018 Comorbid Cataracts, Anemia, Arrhythmia, Coronary Artery Weeks Of Treatment: 28 History: Disease,  Hypertension, Myocardial Infarction, Clustered Wound: No Type II Diabetes, Osteoarthritis Photos Wound Measurements Length: (cm) 1 % Reduction Width: (cm) 0.5 % Reduction Depth: (cm) 0.1 Epithelializ Area: (cm)  0.393 Tunneling: Volume: (cm) 0.039 Undermining in Area: 79.2% in Volume: 93.1% ation: Medium (34-66%) No : No Wound Description Classification: Grade 2 Foul Odor Af Wound Margin: Flat and Intact Slough/Fibri Exudate Amount: Medium Exudate Type: Serous Exudate Color: amber ter Cleansing: No no Yes Wound Bed Granulation Amount: None Present (0%) Exposed Structure Necrotic Amount: Large (67-100%) Fascia Exposed: No Necrotic Quality: Eschar, Adherent Slough Fat Layer (Subcutaneous Tissue) Exposed: Yes Tendon Exposed: No Muscle Exposed: No Joint Exposed: No Bone Exposed: No Treatment Notes Velador, Mercia C. ([id number]) Wound #8 (Right, Lateral Lower Leg) Notes prisma, conform. Patient refused stretchy Electronic Signature(s) Signed: 10/08/2018 4:22:26 PM By: Army Melia Entered By: Army Melia on 10/08/2018 15:53:29 Wedge, [name] ([id number]) -------------------------------------------------------------------------------- Vitals Details Patient Name: Arons, Khalidah C. Date of Service: 10/08/2018 3:30 PM Medical Record Number: [id number] Patient Account Number: [id number] Date of Birth/Sex: 1927-06-10 (85 y.o. F) Treating RN: Army Melia Primary Care Carmen Vallecillo: Ria Bush Other Clinician: Referring Avah Bashor: Ria Bush Treating Benedict Kue/Extender: STONE III, HOYT Weeks in Treatment: 28 Vital Signs Time Taken: 15:49 Temperature (F): 98.4 Height (in): 58 Pulse (bpm): 63 Weight (lbs): 140 Respiratory Rate (breaths/min): 16 Body Mass Index (BMI): 29.3 Blood Pressure (mmHg): 145/51 Reference Range: 80 - 120 mg / dl Electronic Signature(s) Signed: 10/08/2018 4:22:26 PM By: Army Melia Entered By: Army Melia on 10/08/2018 15:49:47

## 2018-10-11 NOTE — Telephone Encounter (Signed)
Pt is requesting a letter to be taken out of work.

## 2018-10-11 NOTE — Telephone Encounter (Signed)
Letter has been printed for MD to sign.

## 2018-10-11 NOTE — Telephone Encounter (Signed)
She can be given the standard COVID letter for high risk status

## 2018-10-12 DIAGNOSIS — D649 Anemia, unspecified: Secondary | ICD-10-CM | POA: Diagnosis not present

## 2018-10-12 DIAGNOSIS — M1990 Unspecified osteoarthritis, unspecified site: Secondary | ICD-10-CM | POA: Diagnosis not present

## 2018-10-12 DIAGNOSIS — I2510 Atherosclerotic heart disease of native coronary artery without angina pectoris: Secondary | ICD-10-CM | POA: Diagnosis not present

## 2018-10-12 DIAGNOSIS — E119 Type 2 diabetes mellitus without complications: Secondary | ICD-10-CM | POA: Diagnosis not present

## 2018-10-12 DIAGNOSIS — L03115 Cellulitis of right lower limb: Secondary | ICD-10-CM | POA: Diagnosis not present

## 2018-10-12 DIAGNOSIS — L97812 Non-pressure chronic ulcer of other part of right lower leg with fat layer exposed: Secondary | ICD-10-CM | POA: Diagnosis not present

## 2018-10-12 DIAGNOSIS — I10 Essential (primary) hypertension: Secondary | ICD-10-CM | POA: Diagnosis not present

## 2018-10-12 DIAGNOSIS — I87331 Chronic venous hypertension (idiopathic) with ulcer and inflammation of right lower extremity: Secondary | ICD-10-CM | POA: Diagnosis not present

## 2018-10-12 DIAGNOSIS — Z7984 Long term (current) use of oral hypoglycemic drugs: Secondary | ICD-10-CM | POA: Diagnosis not present

## 2018-10-12 NOTE — Progress Notes (Signed)
NERY, KALISZ ([id number]) Visit Report for 10/08/2018 Chief Complaint Document Details Patient Name: [name], [name] Date of Service: 10/08/2018 3:30 PM Medical Record Number: [id number] Patient Account Number: [id number] Date of Birth/Sex: 1927-08-04 (85 y.o. F) Treating RN: Harold Barban Primary Care Provider: Ria Bush Other Clinician: Referring Provider: Ria Bush Treating Provider/Extender: Melburn Hake, HOYT Weeks in Treatment: 28 Information Obtained from: Patient Chief Complaint RLE wounds Electronic Signature(s) Signed: 10/10/2018 9:09:50 PM By: Worthy Keeler PA-C Entered By: Worthy Keeler on 10/08/2018 15:59:25 Moone, [name] ([id number]) -------------------------------------------------------------------------------- Debridement Details Patient Name: [name], [name] Date of Service: 10/08/2018 3:30 PM Medical Record Number: [id number] Patient Account Number: [id number] Date of Birth/Sex: May 02, 1927 (85 y.o. F) Treating RN: Harold Barban Primary Care Provider: Ria Bush Other Clinician: Referring Provider: Ria Bush Treating Provider/Extender: Melburn Hake, HOYT Weeks in Treatment: 28 Debridement Performed for Wound #8 Right,Lateral Lower Leg Assessment: Performed By: Physician STONE III, HOYT E., PA-C Debridement Type: Debridement Severity of Tissue Pre Fat layer exposed Debridement: Level of Consciousness (Pre- Awake and Alert procedure): Pre-procedure Verification/Time Yes - 16:11 Out Taken: Start Time: 16:11 Pain Control: Lidocaine Total Area Debrided (L x W): 1 (cm) x 0.5 (cm) = 0.5 (cm) Tissue and other material Eschar, Slough, Subcutaneous, Slough debrided: Level: Skin/Subcutaneous Tissue Debridement Description: Excisional Instrument: Curette Bleeding: Minimum Hemostasis Achieved: Pressure End Time: 16:14 Procedural Pain: 0 Post Procedural Pain: 0 Response to Treatment: Procedure was tolerated well Level  of Consciousness Awake and Alert (Post-procedure): Post Debridement Measurements of Total Wound Length: (cm) 1 Width: (cm) 0.5 Depth: (cm) 0.1 Volume: (cm) 0.039 Character of Wound/Ulcer Post Debridement: Improved Severity of Tissue Post Debridement: Fat layer exposed Post Procedure Diagnosis Same as Pre-procedure Electronic Signature(s) Signed: 10/08/2018 4:36:57 PM By: Harold Barban Signed: 10/10/2018 9:09:50 PM By: Worthy Keeler PA-C Entered By: Harold Barban on 10/08/2018 16:14:22 Tennell, Laurena C. ([id number]) -------------------------------------------------------------------------------- HPI Details Patient Name: [name], [name] Date of Service: 10/08/2018 3:30 PM Medical Record Number: [id number] Patient Account Number: [id number] Date of Birth/Sex: 1927/07/04 (85 y.o. F) Treating RN: Harold Barban Primary Care Provider: Ria Bush Other Clinician: Referring Provider: Ria Bush Treating Provider/Extender: Melburn Hake, HOYT Weeks in Treatment: 28 History of Present Illness HPI Description: Readmission: 03/23/18 on evaluation today patient presents for readmission entire clinic concerning issues that she has been having for the past couple of weeks in regard to her right lower extremity. The medial injury actually occurred when her dog tried to take a blanket from her and subsequently scratched her with his toenail. Subsequently the lateral injury was a wound where she dropped a knife in her kitchen and hit the side of her leg. With that being said both have been open for some time and home health nurse that has been coming out marked for her where the redness was several days ago. Nonetheless this has spread will be on the margin of what was marked. She was placed on Keflex which was started two days ago but at this point it does not seem to have helped with any improvement in the overall appearance of the wounds. No fevers, chills, nausea, or vomiting  noted at this time. 04/02/18 Seen today for follow up and management of right lower extremity wounds. Right lateral wound with adherent slough. She is complaining of pain to touch which limited exam. Recently treated in the ED for cellulitis and wound culture positive for MRSA. Completed a dose of Vancomycin and transitioned to doxycycline by mouth. Erythema remains present with  improvement since last visit. Right medial wound with scab present. No fevers, chills, nausea, or vomiting noted at this time. 04/10/18 on evaluation today patient appears to be doing a Chauca better in regard to her right lateral lower [name]. The slough does seem to be coming off although it is slow from the wound bed. Fortunately there does not appear to be any evidence of infection at this time which is good news. With that being said I do believe that she is going to require longer treatment with the Santyl simply due to the fact that I'm not able to really debride the wound due to the pain that she is experiencing. 04/16/18 upon evaluation today patient's wound bed currently on the lateral lower extremity on the right appears to be doing a Sandora better at this point. We are utilizing sensible currently. Overall I'm very pleased with how things are progressing although there still is some work to do before this will be completely healed. No fevers, chills, nausea, or vomiting noted at this time. 04/23/18 on evaluation today patient appears to be doing about the same at this point maybe just a [place] bit smaller than last week's evaluation. She continues to have some slough buildup on the surface of the wound and has very Graffius granulation unfortunately. This seems to be very slow moving I'm getting somewhat concerned about the possibility of her having decreased blood flow which is leading to her subsequently not being able to heal this area as quickly as she shut otherwise. She is currently utilizing  Santyl. 05/01/18 on evaluation today patient appears to be doing somewhat poorly in regard to her lower extremity ulcer. At this point she's been tolerating the dressing changes to some degree without complication. With that being said she does note of the past several days that she has been having more pain unfortunately. On inspection it appears that she may have signs of infection which would account for the pain. 05/07/18 on evaluation today patient's wound actually appears to be doing better she's not having as much erythema in the right lower extremity as compared to last week. Fortunately there's no signs of infection this is good news as well. Overall very pleased with the progress that she has made up to this point. 05/14/18 on evaluation today patient actually appears to be doing rather well in regard to her right lower extremity ulcer. She does not have erythema that was characterizing the cellulitis prior to initiation of antibiotic therapy. With that being said her wound still appears to be somewhat slow in healing I believe this may be arterial in nature that is my concern at least. With that being said I do want her to see the vascular specialist as soon as possible we been trying to get this set up I'm not sure if she's not answering the phone or what exactly is going on here. ADALEY, KIENE ([id number]) 05/21/18 on evaluation today patient's wound actually appears to be doing a Ytuarte better possibly some additional granulation compared to my last evaluation. With that being said she still continues to have a lot of pain especially when her legs are elevated such as sitting in the chair here in the office. There does not appear to be any signs of infection which is good news. 05/28/18 on evaluation today patient appears to be doing a Maffei better in regard to her right lateral lower [name]. Again she was supposed to have an appointment with vascular unfortunately they  attempted to call her three times and unfortunately were unable to get anything scheduled. That reason closed up for him. She tells me that if the phone is answered and she doesn't recognize what is that she will hang up as well those that she lives with. Obviously she's not gonna recognize somebody calling from the office that she has not previously spoken with. Nonetheless the patient states that she does not remember any fall. I think if we're gonna get her in with vascular specialist we're gonna have to try to make the appointment for her. 06/04/2018 Seen today for follow up and management of right lateral lower extremity wound. Adherent slough present over surface of wound. Scheduled to follow up for ABI/TBI on 06/06/2018 with vein and vascular specialist. She is hard of hearing. Wound is sensitive to touch. No signs or symptoms of infection. Denies any recent fevers, chills, or SOB. 06/11/18 on evaluation today patient actually appears to be doing very well in regard to her right lateral lower extremity ulcer. She's been tolerating the dressing changes without complication. Fortunately there's no evidence of infection. With that being said she did have her arterial studies which revealed that she had a very low ABI which was 0.67 on the right with a TBI of 0.32 and 0.65 on the left with a TBI of 0.16. I do believe she needs to see vascular and we are working on that referral as well. 06/21/18 on evaluation today patient's wound actually appears to be doing okay she does have an appointment with vascular coming up on the 24th. Nonetheless she's been having some issues that are more urinary in nature right now that are causing trouble for her. Fortunately there's no signs of severe sepsis although she is running a low-grade temperature 99.9 on evaluation today. She also is having some issues right now with chills as well as urinary urgency and frequency. Again I feel like the city is more urinary and  symptomology I do not believe she has anything upper respiratory which is good news. Nonetheless I think this may need to be checked out more quickly especially in light of the fact that she's having significant back pain at this point. 07/02/18 on evaluation today patient appears to be doing okay in regard to her ulcer on the right lower extremity. She's been tolerating the dressing changes without complication. Fortunately there does not appear to be any signs of infection which is good news. No fevers, chills, nausea, or vomiting noted at this time. 07/10/18 on evaluation today patient appears to be doing well in regard to her right lateral lower extremity ulcer all things considering. She's been using this form this seems to be the one thing that she can really do on her own and she seems to be making some progress obviously help with this matter. I think the biggest issue limiting her feeling speed is her blood flow she canceled her original appointment with vascular her next one is not till May. She was worried about the Covid-19 Virus and having to see another physician. Nonetheless there's no signs of active infection at this time. 07/16/18 on evaluation today patient appears to be doing okay in regard to her right lateral lower extremity ulcer. She's been tolerating the dressing changes without complication. Fortunately there's no signs of active infection at this time. No fevers, chills, nausea, or vomiting noted at this time. 07/23/18 on evaluation today patient appears to be doing very well in regard to her right lateral load  should be ulcer all things considering. She still again is not seeing vascular until mid May in the meantime she's been utilizing the over-the-counter tripling about appointment which seems to be the only thing she's able to do routinely on her own without getting confused or frustrated. Overall it seems to be doing okay fortunately she's showing no signs of  infection. 07/30/18 upon evaluation today patient actually appears to be doing about the same in regard to her wound in general. She is making very slow progress most likely again due to the issue with her blood flow she sees vascular towards the middle of news. In the meantime we've really been trying to just keep things in the control from the infection standpoint and allow for any healing that could take place up until appointment she can see vascular. 08/06/18 upon evaluation today patient's wound actually appears to be doing about the same although the wound bed may show Robbins bit more granulation which is good news. Fortunately there's no signs of active infection at this time. No fevers, chills, nausea, or vomiting noted at this time. 08/14/18 on evaluation today patient actually appears to be doing about the same in regard to her lower extremity ulcer. Britten, Margi C. ([id number]) Fortunately there's no signs of active infection at this time which is good news. Fortunately the patient has been tolerating the dressing changes without complication. No fevers, chills, nausea, or vomiting noted at this time. She does have her appointment with vascular next week. 08/20/18 on evaluation today patient actually appears to be doing a Rosell better in my opinion with regard to her lower extremity ulcer. She's been tolerating the dressing changes without complication. Fortunately there's no signs of active infection at this time which is good news. She actually will be seen vascular tomorrow. 08/30/18 on evaluation today patient's wound actually appears to be showing some signs of improvement which is good news. Fortunately there's no signs of infection. She does the the vascular doctor on June 4. 09/10/18 on evaluation today patient is status post having had an angiogram with Dr. Lucky Cowboy. Unfortunately she tells me that she still having a lot of discomfort although the wound bed itself seems to be doing  better. She does have more swelling of the right lower extremity but nothing too significant. I think this is causing Morneault bit of increased pain right now as well that is unfortunate. No fevers, chills, nausea, or vomiting noted at this time. 09/17/18 on evaluation today patient actually appears to be doing much better today compared to last week's evaluation. I'm very pleased in this regard. Fortunately there's no signs of active infection which is also good news. 09/24/18 on evaluation today patient appears to be doing well in regard to the wounds on the right lateral lower extremity. Since her vascular intervention I do feel like she's made excellent progress. Fortunately there's no signs of active infection at this time. No fevers, chills, nausea, or vomiting noted at this time. 10/01/18 on evaluation today patient actually appears to be doing well in regard to right lateral lower Trinity ulcers. I think it's the right time at this point to go ahead and switch to [facility] as the dressing of choice. We've been utilizing the alginate and that has been beneficial but she's not noticing as much drainage in fact it's actually staying more dry at this point. We're gonna see how things do with the dressing change I'll see her back next week I'll be seeing that regard in  the meantime she continues to be depressed we did talk about the possibility of seeing a psychiatrist actually has one who she sees and has seen for quite a while. She actually sees her later this month. 10/08/18 on evaluation today patient's wound bed actually showed signs of improvement really at both locations. She has been getting better week by week and her pain is also not nearly as bad as what it was in the past. Overall I think that the vascular intervention has been extremely beneficial for her. Electronic Signature(s) Signed: 10/10/2018 9:09:50 PM By: Worthy Keeler PA-C Entered By: Worthy Keeler on 10/10/2018 20:54:13 Aspinwall,  [name] ([id number]) -------------------------------------------------------------------------------- Physical Exam Details Patient Name: Rase, Cathi C. Date of Service: 10/08/2018 3:30 PM Medical Record Number: [id number] Patient Account Number: [id number] Date of Birth/Sex: 1928/03/06 (85 y.o. F) Treating RN: Harold Barban Primary Care Provider: Ria Bush Other Clinician: Referring Provider: Ria Bush Treating Provider/Extender: STONE III, HOYT Weeks in Treatment: 9 Constitutional Well-nourished and well-hydrated in no acute distress. Respiratory normal breathing without difficulty. Psychiatric this patient is able to make decisions and demonstrates good insight into disease process. Alert and Oriented x 3. pleasant and cooperative. Notes Patient's wound bed currently showed signs of good granulation at this time there does not appear to be evidence of active infection which is excellent news. She has good epithelialization as well. I did have to remove some of the eschar overlying the surface of the wound but other than this things seem to be moving along quite nicely. Electronic Signature(s) Signed: 10/10/2018 9:09:50 PM By: Worthy Keeler PA-C Entered By: Worthy Keeler on 10/10/2018 20:54:44 Snider, [name] ([id number]) -------------------------------------------------------------------------------- Physician Orders Details Patient Name: Duell, Jessamyn C. Date of Service: 10/08/2018 3:30 PM Medical Record Number: [id number] Patient Account Number: [id number] Date of Birth/Sex: 1927-10-12 (85 y.o. F) Treating RN: Harold Barban Primary Care Provider: Ria Bush Other Clinician: Referring Provider: Ria Bush Treating Provider/Extender: Melburn Hake, HOYT Weeks in Treatment: 28 Verbal / Phone Orders: No Diagnosis Coding ICD-10 Coding Code Description I87.331 Chronic venous hypertension (idiopathic) with ulcer and inflammation of right lower  extremity E11.622 Type 2 diabetes mellitus with other skin ulcer L97.812 Non-pressure chronic ulcer of other part of right lower leg with fat layer exposed L03.115 Cellulitis of right lower limb Wound Cleansing Wound #8 Right,Lateral Lower Leg o Clean wound with Normal Saline. Anesthetic (add to Medication List) Wound #8 Right,Lateral Lower Leg o Topical Lidocaine 4% cream applied to wound bed prior to debridement (In Clinic Only). Primary Wound Dressing Wound #8 Right,Lateral Lower Leg o Silver Collagen Secondary Dressing Wound #8 Right,Lateral Lower Leg o Conform/Kerlix o Other - Stretchy to secure Dressing Change Frequency Wound #8 Right,Lateral Lower Leg o Change Dressing Monday, Wednesday, Friday Follow-up Appointments Wound #8 Right,Lateral Lower Leg o Return Appointment in 1 week. Edema Control Wound #8 Right,Lateral Lower Leg o Elevate legs to the level of the heart and pump ankles as often as possible Home Health Wound #8 [facility] CARLINA, DERKS ([id number]) o FACE TO FACE ENCOUNTER: MEDICARE and MEDICAID PATIENTS: I certify that this patient is under my care and that I had a face-to-face encounter that meets the physician face-to-face encounter requirements with this patient on this date. The encounter with the patient was in whole or in part for the following MEDICAL CONDITION: (primary reason for Scott) MEDICAL NECESSITY: I certify, that based on my findings, NURSING services are a  medically necessary home health service. HOME BOUND STATUS: I certify that my clinical findings support that this patient is homebound (i.e., Due to illness or injury, pt requires aid of supportive devices such as crutches, cane, wheelchairs, walkers, the use of special transportation or the assistance of another person to leave their place of residence. There is a normal inability to leave the  home and doing so requires considerable and taxing effort. Other absences are for medical reasons / religious services and are infrequent or of short duration when for other reasons). Electronic Signature(s) Signed: 10/08/2018 4:36:57 PM By: Harold Barban Signed: 10/10/2018 9:09:50 PM By: Worthy Keeler PA-C Entered By: Harold Barban on 10/08/2018 16:16:40 Canby, [name] ([id number]) -------------------------------------------------------------------------------- Problem List Details Patient Name: [name], [name] Date of Service: 10/08/2018 3:30 PM Medical Record Number: [id number] Patient Account Number: [id number] Date of Birth/Sex: [birth year] (85 y.o. F) Treating RN: Harold Barban Primary Care Provider: Ria Bush Other Clinician: Referring Provider: Ria Bush Treating Provider/Extender: Melburn Hake, HOYT Weeks in Treatment: 28 Active Problems ICD-10 Evaluated Encounter Code Description Active Date Today Diagnosis I87.331 Chronic venous hypertension (idiopathic) with ulcer and 03/23/2018 No Yes inflammation of right lower extremity E11.622 Type 2 diabetes mellitus with other skin ulcer 03/23/2018 No Yes L97.812 Non-pressure chronic ulcer of other part of right lower leg 03/23/2018 No Yes with fat layer exposed L03.115 Cellulitis of right lower limb 03/23/2018 No Yes Inactive Problems Resolved Problems Electronic Signature(s) Signed: 10/10/2018 9:09:50 PM By: Worthy Keeler PA-C Entered By: Worthy Keeler on 10/08/2018 15:59:19 Fatheree, [name] ([id number]) -------------------------------------------------------------------------------- Progress Note Details Patient Name: [name], [name] Date of Service: 10/08/2018 3:30 PM Medical Record Number: [id number] Patient Account Number: [id number] Date of Birth/Sex: 1927-05-31 (85 y.o. F) Treating RN: Harold Barban Primary Care Provider: Ria Bush Other Clinician: Referring Provider: Ria Bush Treating Provider/Extender: Melburn Hake, HOYT Weeks in Treatment: 28 Subjective Chief Complaint Information obtained from Patient RLE wounds History of Present Illness (HPI) Readmission: 03/23/18 on evaluation today patient presents for readmission entire clinic concerning issues that she has been having for the past couple of weeks in regard to her right lower extremity. The medial injury actually occurred when her dog tried to take a blanket from her and subsequently scratched her with his toenail. Subsequently the lateral injury was a wound where she dropped a knife in her kitchen and hit the side of her leg. With that being said both have been open for some time and home health nurse that has been coming out marked for her where the redness was several days ago. Nonetheless this has spread will be on the margin of what was marked. She was placed on Keflex which was started two days ago but at this point it does not seem to have helped with any improvement in the overall appearance of the wounds. No fevers, chills, nausea, or vomiting noted at this time. 04/02/18 Seen today for follow up and management of right lower extremity wounds. Right lateral wound with adherent slough. She is complaining of pain to touch which limited exam. Recently treated in the ED for cellulitis and wound culture positive for MRSA. Completed a dose of Vancomycin and transitioned to doxycycline by mouth. Erythema remains present with improvement since last visit. Right medial wound with scab present. No fevers, chills, nausea, or vomiting noted at this time. 04/10/18 on evaluation today patient appears to be doing a Odonnel better in regard to her right lateral lower [name]. The slough does seem  to be coming off although it is slow from the wound bed. Fortunately there does not appear to be any evidence of infection at this time which is good news. With that being said I do believe that she is going to  require longer treatment with the Santyl simply due to the fact that I'm not able to really debride the wound due to the pain that she is experiencing. 04/16/18 upon evaluation today patient's wound bed currently on the lateral lower extremity on the right appears to be doing a Deleon better at this point. We are utilizing sensible currently. Overall I'm very pleased with how things are progressing although there still is some work to do before this will be completely healed. No fevers, chills, nausea, or vomiting noted at this time. 04/23/18 on evaluation today patient appears to be doing about the same at this point maybe just a [place] bit smaller than last week's evaluation. She continues to have some slough buildup on the surface of the wound and has very Lapinsky granulation unfortunately. This seems to be very slow moving I'm getting somewhat concerned about the possibility of her having decreased blood flow which is leading to her subsequently not being able to heal this area as quickly as she shut otherwise. She is currently utilizing Santyl. 05/01/18 on evaluation today patient appears to be doing somewhat poorly in regard to her lower extremity ulcer. At this point she's been tolerating the dressing changes to some degree without complication. With that being said she does note of the past several days that she has been having more pain unfortunately. On inspection it appears that she may have signs of infection which would account for the pain. 05/07/18 on evaluation today patient's wound actually appears to be doing better she's not having as much erythema in the right lower extremity as compared to last week. Fortunately there's no signs of infection this is good news as well. Overall very pleased with the progress that she has made up to this point. RYELEIGH, SANTORE ([id number]) 05/14/18 on evaluation today patient actually appears to be doing rather well in regard to her right lower  extremity ulcer. She does not have erythema that was characterizing the cellulitis prior to initiation of antibiotic therapy. With that being said her wound still appears to be somewhat slow in healing I believe this may be arterial in nature that is my concern at least. With that being said I do want her to see the vascular specialist as soon as possible we been trying to get this set up I'm not sure if she's not answering the phone or what exactly is going on here. 05/21/18 on evaluation today patient's wound actually appears to be doing a [place] better possibly some additional granulation compared to my last evaluation. With that being said she still continues to have a lot of pain especially when her legs are elevated such as sitting in the chair here in the office. There does not appear to be any signs of infection which is good news. 05/28/18 on evaluation today patient appears to be doing a [place] better in regard to her right lateral lower [name]. Again she was supposed to have an appointment with vascular unfortunately they attempted to call her three times and unfortunately were unable to get anything scheduled. That reason closed up for him. She tells me that if the phone is answered and she doesn't recognize what is that she will hang up as well those that she  lives with. Obviously she's not gonna recognize somebody calling from the office that she has not previously spoken with. Nonetheless the patient states that she does not remember any fall. I think if we're gonna get her in with vascular specialist we're gonna have to try to make the appointment for her. 06/04/2018 Seen today for follow up and management of right lateral lower extremity wound. Adherent slough present over surface of wound. Scheduled to follow up for ABI/TBI on 06/06/2018 with vein and vascular specialist. She is hard of hearing. Wound is sensitive to touch. No signs or symptoms of infection. Denies any recent  fevers, chills, or SOB. 06/11/18 on evaluation today patient actually appears to be doing very well in regard to her right lateral lower extremity ulcer. She's been tolerating the dressing changes without complication. Fortunately there's no evidence of infection. With that being said she did have her arterial studies which revealed that she had a very low ABI which was 0.67 on the right with a TBI of 0.32 and 0.65 on the left with a TBI of 0.16. I do believe she needs to see vascular and we are working on that referral as well. 06/21/18 on evaluation today patient's wound actually appears to be doing okay she does have an appointment with vascular coming up on the 24th. Nonetheless she's been having some issues that are more urinary in nature right now that are causing trouble for her. Fortunately there's no signs of severe sepsis although she is running a low-grade temperature 99.9 on evaluation today. She also is having some issues right now with chills as well as urinary urgency and frequency. Again I feel like the city is more urinary and symptomology I do not believe she has anything upper respiratory which is good news. Nonetheless I think this may need to be checked out more quickly especially in light of the fact that she's having significant back pain at this point. 07/02/18 on evaluation today patient appears to be doing okay in regard to her ulcer on the right lower extremity. She's been tolerating the dressing changes without complication. Fortunately there does not appear to be any signs of infection which is good news. No fevers, chills, nausea, or vomiting noted at this time. 07/10/18 on evaluation today patient appears to be doing well in regard to her right lateral lower extremity ulcer all things considering. She's been using this form this seems to be the one thing that she can really do on her own and she seems to be making some progress obviously help with this matter. I think the  biggest issue limiting her feeling speed is her blood flow she canceled her original appointment with vascular her next one is not till May. She was worried about the Covid-19 Virus and having to see another physician. Nonetheless there's no signs of active infection at this time. 07/16/18 on evaluation today patient appears to be doing okay in regard to her right lateral lower extremity ulcer. She's been tolerating the dressing changes without complication. Fortunately there's no signs of active infection at this time. No fevers, chills, nausea, or vomiting noted at this time. 07/23/18 on evaluation today patient appears to be doing very well in regard to her right lateral load should be ulcer all things considering. She still again is not seeing vascular until mid May in the meantime she's been utilizing the over-the-counter tripling about appointment which seems to be the only thing she's able to do routinely on her own without getting  confused or frustrated. Overall it seems to be doing okay fortunately she's showing no signs of infection. 07/30/18 upon evaluation today patient actually appears to be doing about the same in regard to her wound in general. She is making very slow progress most likely again due to the issue with her blood flow she sees vascular towards the middle of news. In the meantime we've really been trying to just keep things in the control from the infection standpoint and allow for any healing that could take place up until appointment she can see vascular. YOANA, STAIB ([id number]) 08/06/18 upon evaluation today patient's wound actually appears to be doing about the same although the wound bed may show [place] bit more granulation which is good news. Fortunately there's no signs of active infection at this time. No fevers, chills, nausea, or vomiting noted at this time. 08/14/18 on evaluation today patient actually appears to be doing about the same in regard to her lower  extremity ulcer. Fortunately there's no signs of active infection at this time which is good news. Fortunately the patient has been tolerating the dressing changes without complication. No fevers, chills, nausea, or vomiting noted at this time. She does have her appointment with vascular next week. 08/20/18 on evaluation today patient actually appears to be doing a [place] better in my opinion with regard to her lower extremity ulcer. She's been tolerating the dressing changes without complication. Fortunately there's no signs of active infection at this time which is good news. She actually will be seen vascular tomorrow. 08/30/18 on evaluation today patient's wound actually appears to be showing some signs of improvement which is good news. Fortunately there's no signs of infection. She does the the vascular doctor on June 4. 09/10/18 on evaluation today patient is status post having had an angiogram with Dr. Lucky Cowboy. Unfortunately she tells me that she still having a lot of discomfort although the wound bed itself seems to be doing better. She does have more swelling of the right lower extremity but nothing too significant. I think this is causing [place] bit of increased pain right now as well that is unfortunate. No fevers, chills, nausea, or vomiting noted at this time. 09/17/18 on evaluation today patient actually appears to be doing much better today compared to last week's evaluation. I'm very pleased in this regard. Fortunately there's no signs of active infection which is also good news. 09/24/18 on evaluation today patient appears to be doing well in regard to the wounds on the right lateral lower extremity. Since her vascular intervention I do feel like she's made excellent progress. Fortunately there's no signs of active infection at this time. No fevers, chills, nausea, or vomiting noted at this time. 10/01/18 on evaluation today patient actually appears to be doing well in regard to right  lateral lower Trinity ulcers. I think it's the right time at this point to go ahead and switch to [facility] as the dressing of choice. We've been utilizing the alginate and that has been beneficial but she's not noticing as much drainage in fact it's actually staying more dry at this point. We're gonna see how things do with the dressing change I'll see her back next week I'll be seeing that regard in the meantime she continues to be depressed we did talk about the possibility of seeing a psychiatrist actually has one who she sees and has seen for quite a while. She actually sees her later this month. 10/08/18 on evaluation today patient's wound bed  actually showed signs of improvement really at both locations. She has been getting better week by week and her pain is also not nearly as bad as what it was in the past. Overall I think that the vascular intervention has been extremely beneficial for her. Patient History Information obtained from Patient. Family History Diabetes - Father,Siblings,Child, Hypertension - Child, Lung Disease - Child, Stroke - Mother, No family history of Cancer, Heart Disease, Kidney Disease, Seizures, Thyroid Problems. Social History Former smoker, Marital Status - Single, Alcohol Use - Never, Drug Use - No History, Caffeine Use - Daily. Medical History Eyes Patient has history of Cataracts - removed Denies history of Glaucoma, Optic Neuritis Hematologic/Lymphatic Patient has history of Anemia Denies history of Hemophilia, Human Immunodeficiency Virus, Lymphedema, Sickle Cell Disease Respiratory Denies history of Aspiration, Asthma, Chronic Obstructive Pulmonary Disease (COPD), Pneumothorax, Sleep Apnea, Maino, Laneta C. ([id number]) Tuberculosis Cardiovascular Patient has history of Arrhythmia, Coronary Artery Disease, Hypertension, Myocardial Infarction Denies history of Angina, Congestive Heart Failure, Deep Vein Thrombosis, Hypotension, Peripheral Arterial  Disease, Peripheral Venous Disease, Phlebitis Endocrine Patient has history of Type II Diabetes Denies history of Type I Diabetes Genitourinary Denies history of End Stage Renal Disease Immunological Denies history of Lupus Erythematosus, Raynaud s, Scleroderma Integumentary (Skin) Denies history of History of Burn, History of pressure wounds Musculoskeletal Patient has history of Osteoarthritis Denies history of Gout, Rheumatoid Arthritis, Osteomyelitis Neurologic Denies history of Dementia, Neuropathy, Quadriplegia, Paraplegia, Seizure Disorder Oncologic Denies history of Received Chemotherapy, Received Radiation Medical And Surgical History Notes Constitutional Symptoms (General Health) High Blood Pressure, open heart surgery, gall stone, hysterectomy; Fluid pill, Anxiety Review of Systems (ROS) Constitutional Symptoms (General Health) Denies complaints or symptoms of Fatigue, Fever, Chills, Marked Weight Change. Respiratory Denies complaints or symptoms of Chronic or frequent coughs, Shortness of Breath. Cardiovascular Denies complaints or symptoms of Chest pain, LE edema. Psychiatric Denies complaints or symptoms of Anxiety, Claustrophobia. Objective Constitutional Well-nourished and well-hydrated in no acute distress. Vitals Time Taken: 3:49 PM, Height: 58 in, Weight: 140 lbs, BMI: 29.3, Temperature: 98.4 F, Pulse: 63 bpm, Respiratory Rate: 16 breaths/min, Blood Pressure: 145/51 mmHg. Respiratory normal breathing without difficulty. Psychiatric this patient is able to make decisions and demonstrates good insight into disease process. Alert and Oriented x 3. pleasant and cooperative. [name], [name] ([id number]) General Notes: Patient's wound bed currently showed signs of good granulation at this time there does not appear to be evidence of active infection which is excellent news. She has good epithelialization as well. I did have to remove some of the eschar  overlying the surface of the wound but other than this things seem to be moving along quite nicely. Integumentary (Hair, Skin) Wound #8 status is Open. Original cause of wound was Trauma. The wound is located on the Right,Lateral Lower Leg. The wound measures 1cm length x 0.5cm width x 0.1cm depth; 0.393cm^2 area and 0.039cm^3 volume. There is Fat Layer (Subcutaneous Tissue) Exposed exposed. There is no tunneling or undermining noted. There is a medium amount of serous drainage noted. The wound margin is flat and intact. There is no granulation within the wound bed. There is a large (67-100%) amount of necrotic tissue within the wound bed including Eschar and Adherent Slough. Assessment Active Problems ICD-10 Chronic venous hypertension (idiopathic) with ulcer and inflammation of right lower extremity Type 2 diabetes mellitus with other skin ulcer Non-pressure chronic ulcer of other part of right lower leg with fat layer exposed Cellulitis of right lower limb Procedures Wound #8  Pre-procedure diagnosis of Wound #8 is a Diabetic Wound/Ulcer of the Lower Extremity located on the Right,Lateral Lower Leg .Severity of Tissue Pre Debridement is: Fat layer exposed. There was a Excisional Skin/Subcutaneous Tissue Debridement with a total area of 0.5 sq cm performed by STONE III, HOYT E., PA-C. With the following instrument(s): Curette Material removed includes Eschar, Subcutaneous Tissue, and Slough after achieving pain control using Lidocaine. No specimens were taken. A time out was conducted at 16:11, prior to the start of the procedure. A Minimum amount of bleeding was controlled with Pressure. The procedure was tolerated well with a pain level of 0 throughout and a pain level of 0 following the procedure. Post Debridement Measurements: 1cm length x 0.5cm width x 0.1cm depth; 0.039cm^3 volume. Character of Wound/Ulcer Post Debridement is improved. Severity of Tissue Post Debridement is: Fat layer  exposed. Post procedure Diagnosis Wound #8: Same as Pre-Procedure Plan Wound Cleansing: Wound #8 Right,Lateral Lower Leg: Clean wound with Normal Saline. Anesthetic (add to Medication List): Wound #8 Right,Lateral Lower Leg: Topical Lidocaine 4% cream applied to wound bed prior to debridement (In Clinic Only). Primary Wound Dressing: [name], Lynnix C. ([id number]) Wound #8 Right,Lateral Lower Leg: Silver Collagen Secondary Dressing: Wound #8 Right,Lateral Lower Leg: Conform/Kerlix Other - Stretchy to secure Dressing Change Frequency: Wound #8 Right,Lateral Lower Leg: Change Dressing Monday, Wednesday, Friday Follow-up Appointments: Wound #8 Right,Lateral Lower Leg: Return Appointment in 1 week. Edema Control: Wound #8 Right,Lateral Lower Leg: Elevate legs to the level of the heart and pump ankles as often as possible Home Health: Wound #8 Right,Lateral Lower Leg: [facility] FACE TO FACE ENCOUNTER: MEDICARE and MEDICAID PATIENTS: I certify that this patient is under my care and that I had a face-to-face encounter that meets the physician face-to-face encounter requirements with this patient on this date. The encounter with the patient was in whole or in part for the following MEDICAL CONDITION: (primary reason for [place]) MEDICAL NECESSITY: I certify, that based on my findings, NURSING services are a medically necessary home health service. HOME BOUND STATUS: I certify that my clinical findings support that this patient is homebound (i.e., Due to illness or injury, pt requires aid of supportive devices such as crutches, cane, wheelchairs, walkers, the use of special transportation or the assistance of another person to leave their place of residence. There is a normal inability to leave the home and doing so requires considerable and taxing effort. Other absences are for medical reasons / religious services and are infrequent or of short  duration when for other reasons). I'm gonna recommend we continue with the above wound care measures for the next week and the patient is in agreement with plan. We will subsequently see were things stand at follow-up. If anything changes or worsens in the meantime she will contact the office and let me know. Please see above for specific wound care orders. We will see patient for re-evaluation in 1 week(s) here in the clinic. If anything worsens or changes patient will contact our office for additional recommendations. Electronic Signature(s) Signed: 10/10/2018 9:09:50 PM By: Worthy Keeler PA-C Entered By: Worthy Keeler on 10/10/2018 20:55:24 [place], [name] ([id number]) -------------------------------------------------------------------------------- ROS/PFSH Details Patient Name: Clippinger, Mylinh C. Date of Service: 10/08/2018 3:30 PM Medical Record Number: [id number] Patient Account Number: [id number] Date of Birth/Sex: 05/01/1927 (85 y.o. F) Treating RN: Harold Barban Primary Care Provider: Ria Bush Other Clinician: Referring Provider: Ria Bush Treating Provider/Extender: STONE III, HOYT Weeks in  Treatment: 28 Information Obtained From Patient Constitutional Symptoms (General Health) Complaints and Symptoms: Negative for: Fatigue; Fever; Chills; Marked Weight Change Medical History: Past Medical History Notes: High Blood Pressure, open heart surgery, gall stone, hysterectomy; Fluid pill, Anxiety Respiratory Complaints and Symptoms: Negative for: Chronic or frequent coughs; Shortness of Breath Medical History: Negative for: Aspiration; Asthma; Chronic Obstructive Pulmonary Disease (COPD); Pneumothorax; Sleep Apnea; Tuberculosis Cardiovascular Complaints and Symptoms: Negative for: Chest pain; LE edema Medical History: Positive for: Arrhythmia; Coronary Artery Disease; Hypertension; Myocardial Infarction Negative for: Angina; Congestive Heart Failure; Deep  Vein Thrombosis; Hypotension; Peripheral Arterial Disease; Peripheral Venous Disease; Phlebitis Psychiatric Complaints and Symptoms: Negative for: Anxiety; Claustrophobia Eyes Medical History: Positive for: Cataracts - removed Negative for: Glaucoma; Optic Neuritis Hematologic/Lymphatic Medical History: Positive for: Anemia Negative for: Hemophilia; Human Immunodeficiency Virus; Lymphedema; Sickle Cell Disease Endocrine [name], [name] ([id number]) Medical History: Positive for: Type II Diabetes Negative for: Type I Diabetes Time with diabetes: no idea Treated with: Oral agents Blood sugar tested every day: No Genitourinary Medical History: Negative for: End Stage Renal Disease Immunological Medical History: Negative for: Lupus Erythematosus; Raynaudos; Scleroderma Integumentary (Skin) Medical History: Negative for: History of Burn; History of pressure wounds Musculoskeletal Medical History: Positive for: Osteoarthritis Negative for: Gout; Rheumatoid Arthritis; Osteomyelitis Neurologic Medical History: Negative for: Dementia; Neuropathy; Quadriplegia; Paraplegia; Seizure Disorder Oncologic Medical History: Negative for: Received Chemotherapy; Received Radiation HBO Extended History Items Eyes: Cataracts Immunizations Pneumococcal Vaccine: Received Pneumococcal Vaccination: Yes Implantable Devices No devices added Family and Social History Cancer: No; Diabetes: Yes - Father,Siblings,Child; Heart Disease: No; Hypertension: Yes - Child; Kidney Disease: No; Lung Disease: Yes - Child; Seizures: No; Stroke: Yes - Mother; Thyroid Problems: No; Former smoker; Marital Status - Single; Alcohol Use: Never; Drug Use: No History; Caffeine Use: Daily; Financial Concerns: No; Food, Clothing or Shelter Needs: No; Support System Lacking: No; Transportation Concerns: No Physician Affirmation I have reviewed and agree with the above information. GWENDOLYNE, [name]  ([id number]) Electronic Signature(s) Signed: 10/10/2018 9:09:50 PM By: Worthy Keeler PA-C Signed: 10/12/2018 2:24:01 PM By: Harold Barban Entered By: Worthy Keeler on 10/10/2018 20:54:29 [name], [name] ([id number]) -------------------------------------------------------------------------------- SuperBill Details Patient Name: [name], [name] Date of Service: 10/08/2018 Medical Record Number: [id number] Patient Account Number: [id number] Date of Birth/Sex: 23-Oct-1927 (85 y.o. F) Treating RN: Harold Barban Primary Care Provider: Ria Bush Other Clinician: Referring Provider: Ria Bush Treating Provider/Extender: Melburn Hake, HOYT Weeks in Treatment: 28 Diagnosis Coding ICD-10 Codes Code Description I87.331 Chronic venous hypertension (idiopathic) with ulcer and inflammation of right lower extremity E11.622 Type 2 diabetes mellitus with other skin ulcer L97.812 Non-pressure chronic ulcer of other part of right lower leg with fat layer exposed L03.115 Cellulitis of right lower limb Facility Procedures CPT4 Code Description: 60109323 11042 - DEB SUBQ TISSUE 20 SQ CM/< ICD-10 Diagnosis Description F57.322 Non-pressure chronic ulcer of other part of right lower leg wi Modifier: th fat layer expo Quantity: 1 sed Physician Procedures CPT4 Code Description: 0254270 11042 - WC PHYS SUBQ TISS 20 SQ CM ICD-10 Diagnosis Description W23.762 Non-pressure chronic ulcer of other part of right lower leg wi Modifier: th fat layer expo Quantity: 1 sed Electronic Signature(s) Signed: 10/10/2018 9:09:50 PM By: Worthy Keeler PA-C Entered By: Worthy Keeler on 10/08/2018 23:01:15

## 2018-10-13 DIAGNOSIS — I10 Essential (primary) hypertension: Secondary | ICD-10-CM | POA: Diagnosis not present

## 2018-10-13 DIAGNOSIS — L97812 Non-pressure chronic ulcer of other part of right lower leg with fat layer exposed: Secondary | ICD-10-CM | POA: Diagnosis not present

## 2018-10-13 DIAGNOSIS — L03115 Cellulitis of right lower limb: Secondary | ICD-10-CM | POA: Diagnosis not present

## 2018-10-13 DIAGNOSIS — E119 Type 2 diabetes mellitus without complications: Secondary | ICD-10-CM | POA: Diagnosis not present

## 2018-10-13 DIAGNOSIS — I87331 Chronic venous hypertension (idiopathic) with ulcer and inflammation of right lower extremity: Secondary | ICD-10-CM | POA: Diagnosis not present

## 2018-10-13 DIAGNOSIS — I2510 Atherosclerotic heart disease of native coronary artery without angina pectoris: Secondary | ICD-10-CM | POA: Diagnosis not present

## 2018-10-15 ENCOUNTER — Other Ambulatory Visit: Payer: Self-pay

## 2018-10-15 ENCOUNTER — Encounter: Payer: Medicare Other | Admitting: Internal Medicine

## 2018-10-15 DIAGNOSIS — E11622 Type 2 diabetes mellitus with other skin ulcer: Secondary | ICD-10-CM | POA: Diagnosis not present

## 2018-10-15 DIAGNOSIS — I87331 Chronic venous hypertension (idiopathic) with ulcer and inflammation of right lower extremity: Secondary | ICD-10-CM | POA: Diagnosis not present

## 2018-10-15 DIAGNOSIS — L03115 Cellulitis of right lower limb: Secondary | ICD-10-CM | POA: Diagnosis not present

## 2018-10-15 DIAGNOSIS — L97812 Non-pressure chronic ulcer of other part of right lower leg with fat layer exposed: Secondary | ICD-10-CM | POA: Diagnosis not present

## 2018-10-15 DIAGNOSIS — Z87891 Personal history of nicotine dependence: Secondary | ICD-10-CM | POA: Diagnosis not present

## 2018-10-16 NOTE — Progress Notes (Signed)
DAINELLE, HUN ([id number]) Visit Report for 10/15/2018 HPI Details Patient Name: [name], [name] Date of Service: 10/15/2018 3:45 PM Medical Record Number: [id number] Patient Account Number: [id number] Date of Birth/Sex: 1928-03-27 (85 y.o. F) Treating RN: Harold Barban Primary Care Provider: Ria Bush Other Clinician: Referring Provider: Ria Bush Treating Provider/Extender: Beverly Gust in Treatment: 29 History of Present Illness HPI Description: Readmission: 03/23/18 on evaluation today patient presents for readmission entire clinic concerning issues that she has been having for the past couple of weeks in regard to her right lower extremity. The medial injury actually occurred when her dog tried to take a blanket from her and subsequently scratched her with his toenail. Subsequently the lateral injury was a wound where she dropped a knife in her kitchen and hit the side of her leg. With that being said both have been open for some time and home health nurse that has been coming out marked for her where the redness was several days ago. Nonetheless this has spread will be on the margin of what was marked. She was placed on Keflex which was started two days ago but at this point it does not seem to have helped with any improvement in the overall appearance of the wounds. No fevers, chills, nausea, or vomiting noted at this time. 04/02/18 Seen today for follow up and management of right lower extremity wounds. Right lateral wound with adherent slough. She is complaining of pain to touch which limited exam. Recently treated in the ED for cellulitis and wound culture positive for MRSA. Completed a dose of Vancomycin and transitioned to doxycycline by mouth. Erythema remains present with improvement since last visit. Right medial wound with scab present. No fevers, chills, nausea, or vomiting noted at this time. 04/10/18 on evaluation today patient appears to be  doing a [place] better in regard to her right lateral lower [name]. The slough does seem to be coming off although it is slow from the wound bed. Fortunately there does not appear to be any evidence of infection at this time which is good news. With that being said I do believe that she is going to require longer treatment with the Santyl simply due to the fact that I'm not able to really debride the wound due to the pain that she is experiencing. 04/16/18 upon evaluation today patient's wound bed currently on the lateral lower extremity on the right appears to be doing a Delapena better at this point. We are utilizing sensible currently. Overall I'm very pleased with how things are progressing although there still is some work to do before this will be completely healed. No fevers, chills, nausea, or vomiting noted at this time. 04/23/18 on evaluation today patient appears to be doing about the same at this point maybe just a Antonellis bit smaller than last week's evaluation. She continues to have some slough buildup on the surface of the wound and has very Soltau granulation unfortunately. This seems to be very slow moving I'm getting somewhat concerned about the possibility of her having decreased blood flow which is leading to her subsequently not being able to heal this area as quickly as she shut otherwise. She is currently utilizing Santyl. 05/01/18 on evaluation today patient appears to be doing somewhat poorly in regard to her lower extremity ulcer. At this point she's been tolerating the dressing changes to some degree without complication. With that being said she does note of the past several days that she has been having more  pain unfortunately. On inspection it appears that she may have signs of infection which would account for the pain. 05/07/18 on evaluation today patient's wound actually appears to be doing better she's not having as much erythema in the right lower extremity as  compared to last week. Fortunately there's no signs of infection this is good news as well. Overall very pleased with the progress that she has made up to this point. 05/14/18 on evaluation today patient actually appears to be doing rather well in regard to her right lower extremity ulcer. She Roan, Roselin C. ([id number]) does not have erythema that was characterizing the cellulitis prior to initiation of antibiotic therapy. With that being said her wound still appears to be somewhat slow in healing I believe this may be arterial in nature that is my concern at least. With that being said I do want her to see the vascular specialist as soon as possible we been trying to get this set up I'm not sure if she's not answering the phone or what exactly is going on here. 05/21/18 on evaluation today patient's wound actually appears to be doing a Blacketer better possibly some additional granulation compared to my last evaluation. With that being said she still continues to have a lot of pain especially when her legs are elevated such as sitting in the chair here in the office. There does not appear to be any signs of infection which is good news. 05/28/18 on evaluation today patient appears to be doing a Lampkins better in regard to her right lateral lower [name]. Again she was supposed to have an appointment with vascular unfortunately they attempted to call her three times and unfortunately were unable to get anything scheduled. That reason closed up for him. She tells me that if the phone is answered and she doesn't recognize what is that she will hang up as well those that she lives with. Obviously she's not gonna recognize somebody calling from the office that she has not previously spoken with. Nonetheless the patient states that she does not remember any fall. I think if we're gonna get her in with vascular specialist we're gonna have to try to make the appointment for her. 06/04/2018 Seen today for  follow up and management of right lateral lower extremity wound. Adherent slough present over surface of wound. Scheduled to follow up for ABI/TBI on 06/06/2018 with vein and vascular specialist. She is hard of hearing. Wound is sensitive to touch. No signs or symptoms of infection. Denies any recent fevers, chills, or SOB. 06/11/18 on evaluation today patient actually appears to be doing very well in regard to her right lateral lower extremity ulcer. She's been tolerating the dressing changes without complication. Fortunately there's no evidence of infection. With that being said she did have her arterial studies which revealed that she had a very low ABI which was 0.67 on the right with a TBI of 0.32 and 0.65 on the left with a TBI of 0.16. I do believe she needs to see vascular and we are working on that referral as well. 06/21/18 on evaluation today patient's wound actually appears to be doing okay she does have an appointment with vascular coming up on the 24th. Nonetheless she's been having some issues that are more urinary in nature right now that are causing trouble for her. Fortunately there's no signs of severe sepsis although she is running a low-grade temperature 99.9 on evaluation today. She also is having some issues right now with  chills as well as urinary urgency and frequency. Again I feel like the city is more urinary and symptomology I do not believe she has anything upper respiratory which is good news. Nonetheless I think this may need to be checked out more quickly especially in light of the fact that she's having significant back pain at this point. 07/02/18 on evaluation today patient appears to be doing okay in regard to her ulcer on the right lower extremity. She's been tolerating the dressing changes without complication. Fortunately there does not appear to be any signs of infection which is good news. No fevers, chills, nausea, or vomiting noted at this time. 07/10/18 on  evaluation today patient appears to be doing well in regard to her right lateral lower extremity ulcer all things considering. She's been using this form this seems to be the one thing that she can really do on her own and she seems to be making some progress obviously help with this matter. I think the biggest issue limiting her feeling speed is her blood flow she canceled her original appointment with vascular her next one is not till May. She was worried about the Covid-19 Virus and having to see another physician. Nonetheless there's no signs of active infection at this time. 07/16/18 on evaluation today patient appears to be doing okay in regard to her right lateral lower extremity ulcer. She's been tolerating the dressing changes without complication. Fortunately there's no signs of active infection at this time. No fevers, chills, nausea, or vomiting noted at this time. 07/23/18 on evaluation today patient appears to be doing very well in regard to her right lateral load should be ulcer all things considering. She still again is not seeing vascular until mid May in the meantime she's been utilizing the over-the-counter tripling about appointment which seems to be the only thing she's able to do routinely on her own without getting confused or frustrated. Overall it seems to be doing okay fortunately she's showing no signs of infection. 07/30/18 upon evaluation today patient actually appears to be doing about the same in regard to her wound in general. She is making very slow progress most likely again due to the issue with her blood flow she sees vascular towards the middle of news. In the meantime we've really been trying to just keep things in the control from the infection standpoint and allow for any healing that could take place up until appointment she can see vascular. 08/06/18 upon evaluation today patient's wound actually appears to be doing about the same although the wound bed  may Mcmullan, [employer]. ([id number]) show [place] bit more granulation which is good news. Fortunately there's no signs of active infection at this time. No fevers, chills, nausea, or vomiting noted at this time. 08/14/18 on evaluation today patient actually appears to be doing about the same in regard to her lower extremity ulcer. Fortunately there's no signs of active infection at this time which is good news. Fortunately the patient has been tolerating the dressing changes without complication. No fevers, chills, nausea, or vomiting noted at this time. She does have her appointment with vascular next week. 08/20/18 on evaluation today patient actually appears to be doing a Ortner better in my opinion with regard to her lower extremity ulcer. She's been tolerating the dressing changes without complication. Fortunately there's no signs of active infection at this time which is good news. She actually will be seen vascular tomorrow. 08/30/18 on evaluation today patient's wound actually appears  to be showing some signs of improvement which is good news. Fortunately there's no signs of infection. She does the the vascular doctor on June 4. 09/10/18 on evaluation today patient is status post having had an angiogram with Dr. Lucky Cowboy. Unfortunately she tells me that she still having a lot of discomfort although the wound bed itself seems to be doing better. She does have more swelling of the right lower extremity but nothing too significant. I think this is causing Bulnes bit of increased pain right now as well that is unfortunate. No fevers, chills, nausea, or vomiting noted at this time. 09/17/18 on evaluation today patient actually appears to be doing much better today compared to last week's evaluation. I'm very pleased in this regard. Fortunately there's no signs of active infection which is also good news. 09/24/18 on evaluation today patient appears to be doing well in regard to the wounds on the right  lateral lower extremity. Since her vascular intervention I do feel like she's made excellent progress. Fortunately there's no signs of active infection at this time. No fevers, chills, nausea, or vomiting noted at this time. 10/01/18 on evaluation today patient actually appears to be doing well in regard to right lateral lower Trinity ulcers. I think it's the right time at this point to go ahead and switch to [facility] as the dressing of choice. We've been utilizing the alginate and that has been beneficial but she's not noticing as much drainage in fact it's actually staying more dry at this point. We're gonna see how things do with the dressing change I'll see her back next week I'll be seeing that regard in the meantime she continues to be depressed we did talk about the possibility of seeing a psychiatrist actually has one who she sees and has seen for quite a while. She actually sees her later this month. 10/08/18 on evaluation today patient's wound bed actually showed signs of improvement really at both locations. She has been getting better week by week and her pain is also not nearly as bad as what it was in the past. Overall I think that the vascular intervention has been extremely beneficial for her. 10/15/18-Patient returns at 1 week, she has currently 2 small wounds on the right leg both of which appear to be improving but continuing silver collagen to this area Electronic Signature(s) Signed: 10/15/2018 4:38:07 PM By: Tobi Bastos Entered By: Tobi Bastos on 10/15/2018 16:38:07 Smeltzer, [name] ([id number]) -------------------------------------------------------------------------------- Physical Exam Details Patient Name: Pless, Chandria C. Date of Service: 10/15/2018 3:45 PM Medical Record Number: [id number] Patient Account Number: [id number] Date of Birth/Sex: 1927/12/29 (85 y.o. F) Treating RN: Harold Barban Primary Care Provider: Ria Bush Other Clinician: Referring  Provider: Ria Bush Treating Provider/Extender: Beverly Gust in Treatment: 29 Constitutional alert and oriented x 3. sitting or standing blood pressure is within target range for patient.. supine blood pressure is within target range for patient.. pulse regular and within target range for patient.Marland Kitchen respirations regular, non-labored and within target range for patient.Marland Kitchen temperature within target range for patient.. . . Well-nourished and well-hydrated in no acute distress. Notes 2 small wounds on the right anterior leg appear to be healing, surrounding skin has slight hyperkeratosis but overall doing well without inflammation Electronic Signature(s) Signed: 10/15/2018 4:46:14 PM By: Tobi Bastos Entered By: Tobi Bastos on 10/15/2018 16:46:14 Delosreyes, [name] ([id number]) -------------------------------------------------------------------------------- Physician Orders Details Patient Name: Nisley, Ivionna C. Date of Service: 10/15/2018 3:45 PM Medical Record Number: [id number] Patient Account Number:  [id number] Date of Birth/Sex: July 30, 1927 (85 y.o. F) Treating RN: Harold Barban Primary Care Provider: Ria Bush Other Clinician: Referring Provider: Ria Bush Treating Provider/Extender: Beverly Gust in Treatment: 3 Verbal / Phone Orders: No Diagnosis Coding Wound Cleansing Wound #8 Right,Lateral Lower Leg o Clean wound with Normal Saline. Anesthetic (add to Medication List) Wound #8 Right,Lateral Lower Leg o Topical Lidocaine 4% cream applied to wound bed prior to debridement (In Clinic Only). Primary Wound Dressing Wound #8 Right,Lateral Lower Leg o Silver Collagen Secondary Dressing Wound #8 Right,Lateral Lower Leg o Conform/Kerlix o Other - Stretchy to secure Dressing Change Frequency Wound #8 Right,Lateral Lower Leg o Change Dressing Monday, Wednesday, Friday Follow-up Appointments Wound #8 Right,Lateral Lower  Leg o Return Appointment in 1 week. Edema Control Wound #8 Right,Lateral Lower Leg o Elevate legs to the level of the heart and pump ankles as often as possible Home Health Wound #8 Right,Lateral Lower Leg o [place] [facility] o FACE TO FACE ENCOUNTER: MEDICARE and MEDICAID PATIENTS: I certify that this patient is under my care and that I had a face-to-face encounter that meets the physician face-to-face encounter requirements with this patient on this date. The encounter with the patient was in whole or in part for the following MEDICAL CONDITION: (primary reason for [place]) MEDICAL NECESSITY: I certify, that based on my findings, NURSING services are a medically necessary home health service. HOME BOUND STATUS: I certify that my clinical findings support that this patient is homebound (i.e., Due to illness or injury, pt requires aid of supportive devices such as crutches, cane, wheelchairs, walkers, the use of special transportation or the assistance of another person to leave their place of residence. There is a normal inability to leave the home and doing so requires considerable and taxing effort. Other absences are for medical reasons / religious services and are infrequent or of short duration when for other reasons). LAURICE, IGLESIA ([id number]) Electronic Signature(s) Signed: 10/15/2018 5:21:59 PM By: Harold Barban Signed: 10/16/2018 8:15:47 AM By: Tobi Bastos Entered By: Harold Barban on 10/15/2018 16:37:20 Burkel, [name] ([id number]) -------------------------------------------------------------------------------- Progress Note Details Patient Name: Barthel, Margeaux C. Date of Service: 10/15/2018 3:45 PM Medical Record Number: [id number] Patient Account Number: [id number] Date of Birth/Sex: Sep 28, 1927 (85 y.o. F) Treating RN: Harold Barban Primary Care Provider: Ria Bush Other Clinician: Referring Provider: Ria Bush Treating Provider/Extender: Beverly Gust in Treatment: 29 Subjective History of Present Illness (HPI) Readmission: 03/23/18 on evaluation today patient presents for readmission entire clinic concerning issues that she has been having for the past couple of weeks in regard to her right lower extremity. The medial injury actually occurred when her dog tried to take a blanket from her and subsequently scratched her with his toenail. Subsequently the lateral injury was a wound where she dropped a knife in her kitchen and hit the side of her leg. With that being said both have been open for some time and home health nurse that has been coming out marked for her where the redness was several days ago. Nonetheless this has spread will be on the margin of what was marked. She was placed on Keflex which was started two days ago but at this point it does not seem to have helped with any improvement in the overall appearance of the wounds. No fevers, chills, nausea, or vomiting noted at this time. 04/02/18 Seen today for follow up and management of right lower extremity wounds. Right lateral wound with adherent  slough. She is complaining of pain to touch which limited exam. Recently treated in the ED for cellulitis and wound culture positive for MRSA. Completed a dose of Vancomycin and transitioned to doxycycline by mouth. Erythema remains present with improvement since last visit. Right medial wound with scab present. No fevers, chills, nausea, or vomiting noted at this time. 04/10/18 on evaluation today patient appears to be doing a Ehler better in regard to her right lateral lower [name]. The slough does seem to be coming off although it is slow from the wound bed. Fortunately there does not appear to be any evidence of infection at this time which is good news. With that being said I do believe that she is going to require longer treatment with the Santyl simply due to the fact  that I'm not able to really debride the wound due to the pain that she is experiencing. 04/16/18 upon evaluation today patient's wound bed currently on the lateral lower extremity on the right appears to be doing a [place] better at this point. We are utilizing sensible currently. Overall I'm very pleased with how things are progressing although there still is some work to do before this will be completely healed. No fevers, chills, nausea, or vomiting noted at this time. 04/23/18 on evaluation today patient appears to be doing about the same at this point maybe just a Abdelrahman bit smaller than last week's evaluation. She continues to have some slough buildup on the surface of the wound and has very [place]o granulation unfortunately. This seems to be very slow moving I'm getting somewhat concerned about the possibility of her having decreased blood flow which is leading to her subsequently not being able to heal this area as quickly as she shut otherwise. She is currently utilizing Santyl. 05/01/18 on evaluation today patient appears to be doing somewhat poorly in regard to her lower extremity ulcer. At this point she's been tolerating the dressing changes to some degree without complication. With that being said she does note of the past several days that she has been having more pain unfortunately. On inspection it appears that she may have signs of infection which would account for the pain. 05/07/18 on evaluation today patient's wound actually appears to be doing better she's not having as much erythema in the right lower extremity as compared to last week. Fortunately there's no signs of infection this is good news as well. Overall very pleased with the progress that she has made up to this point. 05/14/18 on evaluation today patient actually appears to be doing rather well in regard to her right lower extremity ulcer. She does not have erythema that was characterizing the cellulitis prior to initiation  of antibiotic therapy. With that being said her wound still appears to be somewhat slow in healing I believe this may be arterial in nature that is my concern at least. With that being said I do want her to see the vascular specialist as soon as possible we been trying to get this set up I'm not sure if Zurita, Jasper C. ([id number]) she's not answering the phone or what exactly is going on here. 05/21/18 on evaluation today patient's wound actually appears to be doing a Gorden better possibly some additional granulation compared to my last evaluation. With that being said she still continues to have a lot of pain especially when her legs are elevated such as sitting in the chair here in the office. There does not appear to be any signs  of infection which is good news. 05/28/18 on evaluation today patient appears to be doing a Behrmann better in regard to her right lateral lower [name]. Again she was supposed to have an appointment with vascular unfortunately they attempted to call her three times and unfortunately were unable to get anything scheduled. That reason closed up for him. She tells me that if the phone is answered and she doesn't recognize what is that she will hang up as well those that she lives with. Obviously she's not gonna recognize somebody calling from the office that she has not previously spoken with. Nonetheless the patient states that she does not remember any fall. I think if we're gonna get her in with vascular specialist we're gonna have to try to make the appointment for her. 06/04/2018 Seen today for follow up and management of right lateral lower extremity wound. Adherent slough present over surface of wound. Scheduled to follow up for ABI/TBI on 06/06/2018 with vein and vascular specialist. She is hard of hearing. Wound is sensitive to touch. No signs or symptoms of infection. Denies any recent fevers, chills, or SOB. 06/11/18 on evaluation today patient actually appears  to be doing very well in regard to her right lateral lower extremity ulcer. She's been tolerating the dressing changes without complication. Fortunately there's no evidence of infection. With that being said she did have her arterial studies which revealed that she had a very low ABI which was 0.67 on the right with a TBI of 0.32 and 0.65 on the left with a TBI of 0.16. I do believe she needs to see vascular and we are working on that referral as well. 06/21/18 on evaluation today patient's wound actually appears to be doing okay she does have an appointment with vascular coming up on the 24th. Nonetheless she's been having some issues that are more urinary in nature right now that are causing trouble for her. Fortunately there's no signs of severe sepsis although she is running a low-grade temperature 99.9 on evaluation today. She also is having some issues right now with chills as well as urinary urgency and frequency. Again I feel like the city is more urinary and symptomology I do not believe she has anything upper respiratory which is good news. Nonetheless I think this may need to be checked out more quickly especially in light of the fact that she's having significant back pain at this point. 07/02/18 on evaluation today patient appears to be doing okay in regard to her ulcer on the right lower extremity. She's been tolerating the dressing changes without complication. Fortunately there does not appear to be any signs of infection which is good news. No fevers, chills, nausea, or vomiting noted at this time. 07/10/18 on evaluation today patient appears to be doing well in regard to her right lateral lower extremity ulcer all things considering. She's been using this form this seems to be the one thing that she can really do on her own and she seems to be making some progress obviously help with this matter. I think the biggest issue limiting her feeling speed is her blood flow she canceled her  original appointment with vascular her next one is not till May. She was worried about the Covid-19 Virus and having to see another physician. Nonetheless there's no signs of active infection at this time. 07/16/18 on evaluation today patient appears to be doing okay in regard to her right lateral lower extremity ulcer. She's been tolerating the dressing changes  without complication. Fortunately there's no signs of active infection at this time. No fevers, chills, nausea, or vomiting noted at this time. 07/23/18 on evaluation today patient appears to be doing very well in regard to her right lateral load should be ulcer all things considering. She still again is not seeing vascular until mid May in the meantime she's been utilizing the over-the-counter tripling about appointment which seems to be the only thing she's able to do routinely on her own without getting confused or frustrated. Overall it seems to be doing okay fortunately she's showing no signs of infection. 07/30/18 upon evaluation today patient actually appears to be doing about the same in regard to her wound in general. She is making very slow progress most likely again due to the issue with her blood flow she sees vascular towards the middle of news. In the meantime we've really been trying to just keep things in the control from the infection standpoint and allow for any healing that could take place up until appointment she can see vascular. 08/06/18 upon evaluation today patient's wound actually appears to be doing about the same although the wound bed may show Lesure bit more granulation which is good news. Fortunately there's no signs of active infection at this time. No fevers, chills, nausea, or vomiting noted at this time. CYNTHIA, COGLE ([id number]) 08/14/18 on evaluation today patient actually appears to be doing about the same in regard to her lower extremity ulcer. Fortunately there's no signs of active infection at this  time which is good news. Fortunately the patient has been tolerating the dressing changes without complication. No fevers, chills, nausea, or vomiting noted at this time. She does have her appointment with vascular next week. 08/20/18 on evaluation today patient actually appears to be doing a [place] better in my opinion with regard to her lower extremity ulcer. She's been tolerating the dressing changes without complication. Fortunately there's no signs of active infection at this time which is good news. She actually will be seen vascular tomorrow. 08/30/18 on evaluation today patient's wound actually appears to be showing some signs of improvement which is good news. Fortunately there's no signs of infection. She does the the vascular doctor on June 4. 09/10/18 on evaluation today patient is status post having had an angiogram with Dr. Lucky Cowboy. Unfortunately she tells me that she still having a lot of discomfort although the wound bed itself seems to be doing better. She does have more swelling of the right lower extremity but nothing too significant. I think this is causing [place] bit of increased pain right now as well that is unfortunate. No fevers, chills, nausea, or vomiting noted at this time. 09/17/18 on evaluation today patient actually appears to be doing much better today compared to last week's evaluation. I'm very pleased in this regard. Fortunately there's no signs of active infection which is also good news. 09/24/18 on evaluation today patient appears to be doing well in regard to the wounds on the right lateral lower extremity. Since her vascular intervention I do feel like she's made excellent progress. Fortunately there's no signs of active infection at this time. No fevers, chills, nausea, or vomiting noted at this time. 10/01/18 on evaluation today patient actually appears to be doing well in regard to right lateral lower [place] ulcers. I think it's the right time at this point to go  ahead and switch to [facility] as the dressing of choice. We've been utilizing the alginate and that has been  beneficial but she's not noticing as much drainage in fact it's actually staying more dry at this point. We're gonna see how things do with the dressing change I'll see her back next week I'll be seeing that regard in the meantime she continues to be depressed we did talk about the possibility of seeing a psychiatrist actually has one who she sees and has seen for quite a while. She actually sees her later this month. 10/08/18 on evaluation today patient's wound bed actually showed signs of improvement really at both locations. She has been getting better week by week and her pain is also not nearly as bad as what it was in the past. Overall I think that the vascular intervention has been extremely beneficial for her. 10/15/18-Patient returns at 1 week, she has currently 2 small wounds on the right leg both of which appear to be improving but continuing silver collagen to this area Objective Constitutional alert and oriented x 3. sitting or standing blood pressure is within target range for patient.. supine blood pressure is within target range for patient.. pulse regular and within target range for patient.Marland Kitchen respirations regular, non-labored and within target range for patient.Marland Kitchen temperature within target range for patient.. Well-nourished and well-hydrated in no acute distress. Vitals Time Taken: 4:08 PM, Height: 58 in, Weight: 140 lbs, BMI: 29.3, Temperature: 99.5 F, Pulse: 75 bpm, Respiratory Rate: 18 breaths/min, Blood Pressure: 139/61 mmHg. General Notes: 2 small wounds on the right anterior leg appear to be healing, surrounding skin has slight hyperkeratosis but Parcel, Shawnette C. ([id number]) overall doing well without inflammation Integumentary (Hair, Skin) Wound #8 status is Open. Original cause of wound was Trauma. The wound is located on the Right,Lateral Lower Leg. The wound  measures 0.6cm length x 0.3cm width x 0.1cm depth; 0.141cm^2 area and 0.014cm^3 volume. There is Fat Layer (Subcutaneous Tissue) Exposed exposed. There is no tunneling or undermining noted. There is a medium amount of serous drainage noted. The wound margin is flat and intact. There is no granulation within the wound bed. There is a large (67-100%) amount of necrotic tissue within the wound bed including Eschar and Adherent Slough. Plan Wound Cleansing: Wound #8 Right,Lateral Lower Leg: Clean wound with Normal Saline. Anesthetic (add to Medication List): Wound #8 Right,Lateral Lower Leg: Topical Lidocaine 4% cream applied to wound bed prior to debridement (In Clinic Only). Primary Wound Dressing: Wound #8 Right,Lateral Lower Leg: Silver Collagen Secondary Dressing: Wound #8 Right,Lateral Lower Leg: Conform/Kerlix Other - Stretchy to secure Dressing Change Frequency: Wound #8 Right,Lateral Lower Leg: Change Dressing Monday, Wednesday, Friday Follow-up Appointments: Wound #8 Right,Lateral Lower Leg: Return Appointment in 1 week. Edema Control: Wound #8 Right,Lateral Lower Leg: Elevate legs to the level of the heart and pump ankles as often as possible Home Health: Wound #8 Right,Lateral Lower Leg: [facility] FACE TO FACE ENCOUNTER: MEDICARE and MEDICAID PATIENTS: I certify that this patient is under my care and that I had a face-to-face encounter that meets the physician face-to-face encounter requirements with this patient on this date. The encounter with the patient was in whole or in part for the following MEDICAL CONDITION: (primary reason for [place]) MEDICAL NECESSITY: I certify, that based on my findings, NURSING services are a medically necessary home health service. HOME BOUND STATUS: I certify that my clinical findings support that this patient is homebound (i.e., Due to illness or injury, pt requires aid of supportive devices such as  crutches, cane, wheelchairs, walkers, the  use of special transportation or the assistance of another person to leave their place of residence. There is a normal inability to leave the home and doing so requires considerable and taxing effort. Other absences are for medical reasons / religious services and are infrequent or of short duration when for other reasons). 1. Continue silver collagen to the wounds which appear to be closing up nicely 2. Continue conform/Kerlix to wrap the leg 3. Return to clinic next week COURTNIE, BRENES ([id number]) Electronic Signature(s) Signed: 10/15/2018 4:47:08 PM By: Tobi Bastos Entered By: Tobi Bastos on 10/15/2018 16:47:08 Coey, [name] ([id number]) -------------------------------------------------------------------------------- [facility] Details Patient Name: Heckel, Ellason C. Date of Service: 10/15/2018 Medical Record Number: [id number] Patient Account Number: [id number] Date of Birth/Sex: 1928-01-07 (85 y.o. F) Treating RN: Harold Barban Primary Care Provider: Ria Bush Other Clinician: Referring Provider: Ria Bush Treating Provider/Extender: Beverly Gust in Treatment: 29 Diagnosis Coding ICD-10 Codes Code Description I87.331 Chronic venous hypertension (idiopathic) with ulcer and inflammation of right lower extremity E11.622 Type 2 diabetes mellitus with other skin ulcer L97.812 Non-pressure chronic ulcer of other part of right lower leg with fat layer exposed L03.115 Cellulitis of right lower limb Facility Procedures CPT4 Code: 73578978 Description: 47841 - WOUND CARE VISIT-LEV 2 EST PT Modifier: Quantity: 1 Physician Procedures CPT4 Code: 2820813 Description: 88719 - WC PHYS LEVEL 3 - EST PT ICD-10 Diagnosis Description E11.622 Type 2 diabetes mellitus with other skin ulcer Modifier: Quantity: 1 Electronic Signature(s) Signed: 10/15/2018 4:47:24 PM By: Tobi Bastos Entered By: Tobi Bastos on  10/15/2018 16:47:24

## 2018-10-16 NOTE — Progress Notes (Signed)
TONITA, BILLS ([id number]) Visit Report for 10/15/2018 Arrival Information Details Patient Name: Nienhaus, Klyn C. Date of Service: 10/15/2018 3:45 PM Medical Record Number: [id number] Patient Account Number: [id number] Date of Birth/Sex: [birth year] (85 y.o. F) Treating RN: Montey Hora Primary Care Lesean Woolverton: Ria Bush Other Clinician: Referring Haven Pylant: Ria Bush Treating Mariano Doshi/Extender: Beverly Gust in Treatment: 29 Visit Information History Since Last Visit Added or deleted any medications: No Patient Arrived: Walker Any new allergies or adverse reactions: No Arrival Time: 16:06 Had a fall or experienced change in No Accompanied By: self activities of daily living that may affect Transfer Assistance: None risk of falls: Patient Identification Verified: Yes Signs or symptoms of abuse/neglect since last visito No Secondary Verification Process Completed: Yes Hospitalized since last visit: No Implantable device outside of the clinic excluding No cellular tissue based products placed in the center since last visit: Has Dressing in Place as Prescribed: Yes Pain Present Now: No Electronic Signature(s) Signed: 10/15/2018 4:55:00 PM By: Montey Hora Entered By: Montey Hora on 10/15/2018 16:07:14 Malphrus, [name] ([id number]) -------------------------------------------------------------------------------- Clinic Level of Care Assessment Details Patient Name: Puls, Ramon C. Date of Service: 10/15/2018 3:45 PM Medical Record Number: [id number] Patient Account Number: [id number] Date of Birth/Sex: 1927/08/18 (85 y.o. F) Treating RN: Harold Barban Primary Care Madyx Delfin: Ria Bush Other Clinician: Referring Jaunita Mikels: Ria Bush Treating Gusta Marksberry/Extender: Beverly Gust in Treatment: 29 Clinic Level of Care Assessment Items TOOL 4 Quantity Score []  - Use when only an EandM is performed on FOLLOW-UP visit 0 ASSESSMENTS -  Nursing Assessment / Reassessment []  - Reassessment of Co-morbidities (includes updates in patient status) 0 []  - 0 Reassessment of Adherence to Treatment Plan ASSESSMENTS - Wound and Skin Assessment / Reassessment X - Simple Wound Assessment / Reassessment - one wound 1 5 []  - 0 Complex Wound Assessment / Reassessment - multiple wounds []  - 0 Dermatologic / Skin Assessment (not related to wound area) ASSESSMENTS - Focused Assessment []  - Circumferential Edema Measurements - multi extremities 0 []  - 0 Nutritional Assessment / Counseling / Intervention []  - 0 Lower Extremity Assessment (monofilament, tuning fork, pulses) []  - 0 Peripheral Arterial Disease Assessment (using hand held doppler) ASSESSMENTS - Ostomy and/or Continence Assessment and Care []  - Incontinence Assessment and Management 0 []  - 0 Ostomy Care Assessment and Management (repouching, etc.) PROCESS - Coordination of Care X - Simple Patient / Family Education for ongoing care 1 15 []  - 0 Complex (extensive) Patient / Family Education for ongoing care []  - 0 Staff obtains Programmer, systems, Records, Test Results / Process Orders X- 1 10 Staff telephones HHA, Nursing Homes / Clarify orders / etc []  - 0 Routine Transfer to another Facility (non-emergent condition) []  - 0 Routine Hospital Admission (non-emergent condition) []  - 0 New Admissions / Biomedical engineer / Ordering NPWT, Apligraf, etc. []  - 0 Emergency Hospital Admission (emergent condition) X- 1 10 Simple Discharge Coordination Marinos, Madyn C. ([id number]) []  - 0 Complex (extensive) Discharge Coordination PROCESS - Special Needs []  - Pediatric / Minor Patient Management 0 []  - 0 Isolation Patient Management []  - 0 Hearing / Language / Visual special needs []  - 0 Assessment of Community assistance (transportation, D/C planning, etc.) []  - 0 Additional assistance / Altered mentation []  - 0 Support Surface(s) Assessment (bed, cushion, seat,  etc.) INTERVENTIONS - Wound Cleansing / Measurement X - Simple Wound Cleansing - one wound 1 5 []  - 0 Complex Wound Cleansing - multiple wounds []  - 0 Wound Imaging (photographs - any  number of wounds) []  - 0 Wound Tracing (instead of photographs) X- 1 5 Simple Wound Measurement - one wound []  - 0 Complex Wound Measurement - multiple wounds INTERVENTIONS - Wound Dressings X - Small Wound Dressing one or multiple wounds 1 10 []  - 0 Medium Wound Dressing one or multiple wounds []  - 0 Large Wound Dressing one or multiple wounds []  - 0 Application of Medications - topical []  - 0 Application of Medications - injection INTERVENTIONS - Miscellaneous []  - External ear exam 0 []  - 0 Specimen Collection (cultures, biopsies, blood, body fluids, etc.) []  - 0 Specimen(s) / Culture(s) sent or taken to Lab for analysis []  - 0 Patient Transfer (multiple staff / Civil Service fast streamer / Similar devices) []  - 0 Simple Staple / Suture removal (25 or less) []  - 0 Complex Staple / Suture removal (26 or more) []  - 0 Hypo / Hyperglycemic Management (close monitor of Blood Glucose) []  - 0 Ankle / Brachial Index (ABI) - do not check if billed separately X- 1 5 Vital Signs Sabatino, Demari C. ([id number]) Has the patient been seen at the hospital within the last three years: Yes Total Score: 65 Level Of Care: New/Established - Level 2 Electronic Signature(s) Signed: 10/15/2018 5:21:59 PM By: Harold Barban Entered By: Harold Barban on 10/15/2018 16:36:37 Dawood, [name] ([id number]) -------------------------------------------------------------------------------- Encounter Discharge Information Details Patient Name: Hampe, Anie C. Date of Service: 10/15/2018 3:45 PM Medical Record Number: [id number] Patient Account Number: [id number] Date of Birth/Sex: 06/07/1927 (85 y.o. F) Treating RN: Army Melia Primary Care Dayton Sherr: Ria Bush Other Clinician: Referring Numan Zylstra: Ria Bush Treating Viraj Liby/Extender: Beverly Gust in Treatment: 29 Encounter Discharge Information Items Discharge Condition: Stable Ambulatory Status: Walker Discharge Destination: Home Transportation: Private Auto Accompanied By: self Schedule Follow-up Appointment: Yes Clinical Summary of Care: Electronic Signature(s) Signed: 10/16/2018 10:16:02 AM By: Army Melia Entered By: Army Melia on 10/15/2018 16:43:12 Costlow, [name] ([id number]) -------------------------------------------------------------------------------- Lower Extremity Assessment Details Patient Name: Haskell, Alyda C. Date of Service: 10/15/2018 3:45 PM Medical Record Number: [id number] Patient Account Number: [id number] Date of Birth/Sex: 10-16-1927 (85 y.o. F) Treating RN: Montey Hora Primary Care Lamoine Magallon: Ria Bush Other Clinician: Referring Deairra Halleck: Ria Bush Treating Andreanna Mikolajczak/Extender: Beverly Gust in Treatment: 29 Edema Assessment Assessed: [Left: No] [Right: No] Edema: [Left: N] [Right: o] Vascular Assessment Pulses: Dorsalis Pedis Palpable: [Right:Yes] Electronic Signature(s) Signed: 10/15/2018 4:55:00 PM By: Montey Hora Entered By: Montey Hora on 10/15/2018 16:16:10 Aldana, Zuzu C. ([id number]) -------------------------------------------------------------------------------- Multi Wound Chart Details Patient Name: Vawter, Arhianna C. Date of Service: 10/15/2018 3:45 PM Medical Record Number: [id number] Patient Account Number: [id number] Date of Birth/Sex: 1927/06/02 (85 y.o. F) Treating RN: Harold Barban Primary Care Vance Hochmuth: Ria Bush Other Clinician: Referring Caterina Racine: Ria Bush Treating Farid Grigorian/Extender: Beverly Gust in Treatment: 29 Vital Signs Height(in): 58 Pulse(bpm): 75 Weight(lbs): 140 Blood Pressure(mmHg): 139/61 Body Mass Index(BMI): 29 Temperature(F): 99.5 Respiratory Rate 18 (breaths/min): Photos:  [N/A:N/A] Wound Location: Right Lower Leg - Lateral N/A N/A Wounding Event: Trauma N/A N/A Primary Etiology: Diabetic Wound/Ulcer of the N/A N/A Lower Extremity Comorbid History: Cataracts, Anemia, N/A N/A Arrhythmia, Coronary Artery Disease, Hypertension, Myocardial Infarction, Type II Diabetes, Osteoarthritis Date Acquired: 03/09/2018 N/A N/A Weeks of Treatment: 29 N/A N/A Wound Status: Open N/A N/A Measurements L x W x D 0.6x0.3x0.1 N/A N/A (cm) Area (cm) : 0.141 N/A N/A Volume (cm) : 0.014 N/A N/A % Reduction in Area: 92.50% N/A N/A % Reduction in Volume: 97.50% N/A N/A Classification: Grade 2 N/A N/A Exudate  Amount: Medium N/A N/A Exudate Type: Serous N/A N/A Exudate Color: amber N/A N/A Wound Margin: Flat and Intact N/A N/A Granulation Amount: None Present (0%) N/A N/A Necrotic Amount: Large (67-100%) N/A N/A Necrotic Tissue: Eschar, Adherent Slough N/A N/A Exposed Structures: Fat Layer (Subcutaneous N/A N/A Tissue) Exposed: Yes Fascia: No Saiz, Mandisa C. ([id number]) Tendon: No Muscle: No Joint: No Bone: No Epithelialization: Medium (34-66%) N/A N/A Treatment Notes Electronic Signature(s) Signed: 10/15/2018 5:21:59 PM By: Harold Barban Entered By: Harold Barban on 10/15/2018 16:35:21 Kurek, [name] ([id number]) -------------------------------------------------------------------------------- Fate Details Patient Name: Lindblad, Helmi C. Date of Service: 10/15/2018 3:45 PM Medical Record Number: [id number] Patient Account Number: [id number] Date of Birth/Sex: 10-14-1927 (85 y.o. F) Treating RN: Harold Barban Primary Care Nahsir Venezia: Ria Bush Other Clinician: Referring Katira Dumais: Ria Bush Treating Keonda Dow/Extender: Beverly Gust in Treatment: 29 Active Inactive Abuse / Safety / Falls / Self Care Management Nursing Diagnoses: Potential for falls Goals: Patient will not experience any injury related to  falls Date Initiated: 03/23/2018 Target Resolution Date: 06/09/2018 Goal Status: Active Interventions: Assess fall risk on admission and as needed Notes: Orientation to the Wound Care Program Nursing Diagnoses: Knowledge deficit related to the wound healing center program Goals: Patient/caregiver will verbalize understanding of the [place] Program Date Initiated: 03/23/2018 Target Resolution Date: 06/09/2018 Goal Status: Active Interventions: Provide education on orientation to the wound center Notes: Pain, Acute or Chronic Nursing Diagnoses: Pain, acute or chronic: actual or potential Goals: Patient will verbalize adequate pain control and receive pain control interventions during procedures as needed Date Initiated: 03/23/2018 Target Resolution Date: 06/09/2018 Goal Status: Active Interventions: Assess comfort goal upon admission Urbach, Naavya C. ([id number]) Notes: Wound/Skin Impairment Nursing Diagnoses: Impaired tissue integrity Goals: Ulcer/skin breakdown will heal within 14 weeks Date Initiated: 03/23/2018 Target Resolution Date: 06/09/2018 Goal Status: Active Interventions: Assess patient/caregiver ability to obtain necessary supplies Assess patient/caregiver ability to perform ulcer/skin care regimen upon admission and as needed Assess ulceration(s) every visit Notes: Electronic Signature(s) Signed: 10/15/2018 5:21:59 PM By: Harold Barban Entered By: Harold Barban on 10/15/2018 16:34:52 Pehrson, Arminda C. ([id number]) -------------------------------------------------------------------------------- Pain Assessment Details Patient Name: Sohm, Shontia C. Date of Service: 10/15/2018 3:45 PM Medical Record Number: [id number] Patient Account Number: [id number] Date of Birth/Sex: 1927/12/02 (85 y.o. F) Treating RN: Montey Hora Primary Care Shiree Altemus: Ria Bush Other Clinician: Referring Danna Sewell: Ria Bush Treating Kambree Krauss/Extender:  Beverly Gust in Treatment: 29 Active Problems Location of Pain Severity and Description of Pain Patient Has [name] Yes Site Locations Pain Location: Pain in Ulcers With Dressing Change: Yes Duration of the Pain. Constant / Intermittento Constant Pain Management and Medication Current Pain Management: Electronic Signature(s) Signed: 10/15/2018 4:55:00 PM By: Montey Hora Entered By: Montey Hora on 10/15/2018 16:07:26 Advincula, [name] ([id number]) -------------------------------------------------------------------------------- Patient/Caregiver Education Details Patient Name: Repinski, Irvin C. Date of Service: 10/15/2018 3:45 PM Medical Record Number: [id number] Patient Account Number: [id number] Date of Birth/Gender: [birth year] (85 y.o. F) Treating RN: Harold Barban Primary Care Physician: Ria Bush Other Clinician: Referring Physician: Ria Bush Treating Physician/Extender: Beverly Gust in Treatment: 47 Education Assessment Education Provided To: Patient Education Topics Provided Wound/Skin Impairment: Handouts: Caring for Your Ulcer Methods: Demonstration, Explain/Verbal Responses: State content correctly Electronic Signature(s) Signed: 10/15/2018 5:21:59 PM By: Harold Barban Entered By: Harold Barban on 10/15/2018 16:35:38 Holte, Elodie C. ([id number]) -------------------------------------------------------------------------------- Wound Assessment Details Patient Name: Hoopingarner, Charee C. Date of Service: 10/15/2018 3:45 PM Medical Record Number: [id number] Patient Account Number: [id number] Date of Birth/Sex: 05-14-1927 (85  y.o. F) Treating RN: Montey Hora Primary Care Vinnie Gombert: Ria Bush Other Clinician: Referring Robt Okuda: Ria Bush Treating Mavrick Mcquigg/Extender: Beverly Gust in Treatment: 29 Wound Status Wound Number: 8 Primary Diabetic Wound/Ulcer of the Lower Extremity Etiology: Wound Location:  Right Lower Leg - Lateral Wound Open Wounding Event: Trauma Status: Date Acquired: 03/09/2018 Comorbid Cataracts, Anemia, Arrhythmia, Coronary Artery Weeks Of Treatment: 29 History: Disease, Hypertension, Myocardial Infarction, Clustered Wound: No Type II Diabetes, Osteoarthritis Photos Wound Measurements Length: (cm) 0.6 % Reduction Width: (cm) 0.3 % Reduction Depth: (cm) 0.1 Epithelializ Area: (cm) 0.141 Tunneling: Volume: (cm) 0.014 Undermining in Area: 92.5% in Volume: 97.5% ation: Medium (34-66%) No : No Wound Description Classification: Grade 2 Foul Odor Af Wound Margin: Flat and Intact Slough/Fibri Exudate Amount: Medium Exudate Type: Serous Exudate Color: amber ter Cleansing: No no Yes Wound Bed Granulation Amount: None Present (0%) Exposed Structure Necrotic Amount: Large (67-100%) Fascia Exposed: No Necrotic Quality: Eschar, Adherent Slough Fat Layer (Subcutaneous Tissue) Exposed: Yes Tendon Exposed: No Muscle Exposed: No Joint Exposed: No Bone Exposed: No Treatment Notes Potvin, Yalena C. ([id number]) Wound #8 (Right, Lateral Lower Leg) Notes prisma, conform. Patient refused stretchy Electronic Signature(s) Signed: 10/15/2018 4:55:00 PM By: Montey Hora Entered By: Montey Hora on 10/15/2018 16:15:55 Haven, [name] ([id number]) -------------------------------------------------------------------------------- [facility] Details Patient Name: Bentley, Lynzi C. Date of Service: 10/15/2018 3:45 PM Medical Record Number: [id number] Patient Account Number: [id number] Date of Birth/Sex: 1928-03-01 (85 y.o. F) Treating RN: Montey Hora Primary Care Zeriyah Wain: Ria Bush Other Clinician: Referring Idania Desouza: Ria Bush Treating Fulton Merry/Extender: Beverly Gust in Treatment: 29 Vital Signs Time Taken: 16:08 Temperature (F): 99.5 Height (in): 58 Pulse (bpm): 75 Weight (lbs): 140 Respiratory Rate (breaths/min): 18 Body Mass  Index (BMI): 29.3 Blood Pressure (mmHg): 139/61 Reference Range: 80 - 120 mg / dl Electronic Signature(s) Signed: 10/15/2018 4:55:00 PM By: Montey Hora Entered By: Montey Hora on 10/15/2018 16:10:21

## 2018-10-17 DIAGNOSIS — I2510 Atherosclerotic heart disease of native coronary artery without angina pectoris: Secondary | ICD-10-CM | POA: Diagnosis not present

## 2018-10-17 DIAGNOSIS — L97812 Non-pressure chronic ulcer of other part of right lower leg with fat layer exposed: Secondary | ICD-10-CM | POA: Diagnosis not present

## 2018-10-17 DIAGNOSIS — L03115 Cellulitis of right lower limb: Secondary | ICD-10-CM | POA: Diagnosis not present

## 2018-10-17 DIAGNOSIS — I87331 Chronic venous hypertension (idiopathic) with ulcer and inflammation of right lower extremity: Secondary | ICD-10-CM | POA: Diagnosis not present

## 2018-10-17 DIAGNOSIS — I10 Essential (primary) hypertension: Secondary | ICD-10-CM | POA: Diagnosis not present

## 2018-10-17 DIAGNOSIS — E119 Type 2 diabetes mellitus without complications: Secondary | ICD-10-CM | POA: Diagnosis not present

## 2018-10-19 DIAGNOSIS — L97812 Non-pressure chronic ulcer of other part of right lower leg with fat layer exposed: Secondary | ICD-10-CM | POA: Diagnosis not present

## 2018-10-19 DIAGNOSIS — E119 Type 2 diabetes mellitus without complications: Secondary | ICD-10-CM | POA: Diagnosis not present

## 2018-10-19 DIAGNOSIS — I87331 Chronic venous hypertension (idiopathic) with ulcer and inflammation of right lower extremity: Secondary | ICD-10-CM | POA: Diagnosis not present

## 2018-10-19 DIAGNOSIS — I2510 Atherosclerotic heart disease of native coronary artery without angina pectoris: Secondary | ICD-10-CM | POA: Diagnosis not present

## 2018-10-19 DIAGNOSIS — L03115 Cellulitis of right lower limb: Secondary | ICD-10-CM | POA: Diagnosis not present

## 2018-10-19 DIAGNOSIS — I10 Essential (primary) hypertension: Secondary | ICD-10-CM | POA: Diagnosis not present

## 2018-10-22 ENCOUNTER — Encounter: Payer: Medicare Other | Admitting: Physician Assistant

## 2018-10-22 ENCOUNTER — Other Ambulatory Visit: Payer: Self-pay | Admitting: Family Medicine

## 2018-10-22 ENCOUNTER — Other Ambulatory Visit: Payer: Self-pay

## 2018-10-22 DIAGNOSIS — E11622 Type 2 diabetes mellitus with other skin ulcer: Secondary | ICD-10-CM | POA: Diagnosis not present

## 2018-10-22 DIAGNOSIS — L97812 Non-pressure chronic ulcer of other part of right lower leg with fat layer exposed: Secondary | ICD-10-CM | POA: Diagnosis not present

## 2018-10-22 DIAGNOSIS — L03115 Cellulitis of right lower limb: Secondary | ICD-10-CM | POA: Diagnosis not present

## 2018-10-22 DIAGNOSIS — I87331 Chronic venous hypertension (idiopathic) with ulcer and inflammation of right lower extremity: Secondary | ICD-10-CM | POA: Diagnosis not present

## 2018-10-22 DIAGNOSIS — Z87891 Personal history of nicotine dependence: Secondary | ICD-10-CM | POA: Diagnosis not present

## 2018-10-24 ENCOUNTER — Telehealth: Payer: Self-pay | Admitting: Family Medicine

## 2018-10-24 DIAGNOSIS — E119 Type 2 diabetes mellitus without complications: Secondary | ICD-10-CM | POA: Diagnosis not present

## 2018-10-24 DIAGNOSIS — L03115 Cellulitis of right lower limb: Secondary | ICD-10-CM | POA: Diagnosis not present

## 2018-10-24 DIAGNOSIS — I2510 Atherosclerotic heart disease of native coronary artery without angina pectoris: Secondary | ICD-10-CM | POA: Diagnosis not present

## 2018-10-24 DIAGNOSIS — I10 Essential (primary) hypertension: Secondary | ICD-10-CM | POA: Diagnosis not present

## 2018-10-24 DIAGNOSIS — L97812 Non-pressure chronic ulcer of other part of right lower leg with fat layer exposed: Secondary | ICD-10-CM | POA: Diagnosis not present

## 2018-10-24 DIAGNOSIS — I87331 Chronic venous hypertension (idiopathic) with ulcer and inflammation of right lower extremity: Secondary | ICD-10-CM | POA: Diagnosis not present

## 2018-10-24 MED ORDER — HYDROCODONE-ACETAMINOPHEN 5-325 MG PO TABS: 0.5000 | ORAL_TABLET | Freq: Four times a day (QID) | ORAL | 0 refills | Status: DC | PRN

## 2018-10-24 NOTE — Telephone Encounter (Signed)
Pt called back wanting meds for leg cramp for herself   She stated she has not talked to dr g about this.

## 2018-10-24 NOTE — Telephone Encounter (Signed)
Name of Medication: hydrocodone apap 5-325 mg Name of Pharmacy:[facility] or Written Date and Quantity: # 60 on 09/12/18 Last Office Visit and Type:07/18/18 back pain  Next Office Visit and Type:02/01/19 annual visit Last Controlled Substance Agreement Date:10/16/17  Last UDS:10/11/17

## 2018-10-24 NOTE — Telephone Encounter (Signed)
Patient requested refill  HYDROcodone  A couple tablets left   [facility] [address] [place]     Patient would also like to know if something could be prescribed for cramps. Patient made it sound like this was for someone else. She stated when "he" lays down you can see he is having cramps in his legs  Please advise C/B # [phone]

## 2018-10-24 NOTE — Telephone Encounter (Signed)
Pt aware that medication has been refilled. Aware of recommendations. Nothing further needed.

## 2018-10-24 NOTE — Telephone Encounter (Signed)
Hydrocodone refilled. Recommend conservative management for leg cramping - leg elevation, stretching, ensure good water hydration.

## 2018-10-26 DIAGNOSIS — I10 Essential (primary) hypertension: Secondary | ICD-10-CM | POA: Diagnosis not present

## 2018-10-26 DIAGNOSIS — E119 Type 2 diabetes mellitus without complications: Secondary | ICD-10-CM | POA: Diagnosis not present

## 2018-10-26 DIAGNOSIS — L03115 Cellulitis of right lower limb: Secondary | ICD-10-CM | POA: Diagnosis not present

## 2018-10-26 DIAGNOSIS — I87331 Chronic venous hypertension (idiopathic) with ulcer and inflammation of right lower extremity: Secondary | ICD-10-CM | POA: Diagnosis not present

## 2018-10-26 DIAGNOSIS — L97812 Non-pressure chronic ulcer of other part of right lower leg with fat layer exposed: Secondary | ICD-10-CM | POA: Diagnosis not present

## 2018-10-26 DIAGNOSIS — I2510 Atherosclerotic heart disease of native coronary artery without angina pectoris: Secondary | ICD-10-CM | POA: Diagnosis not present

## 2018-10-27 NOTE — Progress Notes (Signed)
LORAE, ROIG ([id number]) Visit Report for 10/22/2018 Arrival Information Details Patient Name: [name], [name] Date of Service: 10/22/2018 3:15 PM Medical Record Number: [id number] Patient Account Number: [id number] Date of Birth/Sex: 1927-10-15 (85 y.o. F) Treating RN: Montey Hora Primary Care Lorilynn Lehr: Ria Bush Other Clinician: Referring Shruthi Northrup: Ria Bush Treating Cherokee Clowers/Extender: Melburn Hake, HOYT Weeks in Treatment: 33 Visit Information History Since Last Visit Added or deleted any medications: No Patient Arrived: Walker Any new allergies or adverse reactions: No Arrival Time: 15:20 Had a fall or experienced change in No Accompanied By: self activities of daily living that may affect Transfer Assistance: None risk of falls: Patient Identification Verified: Yes Signs or symptoms of abuse/neglect since last visito No Secondary Verification Process Completed: Yes Hospitalized since last visit: No Implantable device outside of the clinic excluding No cellular tissue based products placed in the center since last visit: Has Dressing in Place as Prescribed: No Has Compression in Place as Prescribed: Yes Pain Present Now: Yes Electronic Signature(s) Signed: 10/22/2018 4:17:58 PM By: Montey Hora Entered By: Montey Hora on 10/22/2018 15:21:02 [name], [name] ([id number]) -------------------------------------------------------------------------------- Encounter Discharge Information Details Patient Name: [name], [name] Date of Service: 10/22/2018 3:15 PM Medical Record Number: [id number] Patient Account Number: [id number] Date of Birth/Sex: 12/21/1927 (85 y.o. F) Treating RN: Montey Hora Primary Care Khaiden Segreto: Ria Bush Other Clinician: Referring Felissa Blouch: Ria Bush Treating [place] Lust/Extender: Melburn Hake, HOYT Weeks in Treatment: 30 Encounter Discharge Information Items Post Procedure Vitals Discharge Condition:  Stable Temperature (F): 98.1 Ambulatory Status: Walker Pulse (bpm): 73 Discharge Destination: Home Respiratory Rate (breaths/min): 16 Transportation: Private Auto Blood Pressure (mmHg): 139/73 Accompanied By: self Schedule Follow-up Appointment: Yes Clinical Summary of Care: Electronic Signature(s) Signed: 10/22/2018 4:17:58 PM By: Montey Hora Entered By: Montey Hora on 10/22/2018 15:46:06 [name] ([id number]) -------------------------------------------------------------------------------- Lower Extremity Assessment Details Patient Name: [name], [name] Date of Service: 10/22/2018 3:15 PM Medical Record Number: [id number] Patient Account Number: [id number] Date of Birth/Sex: April 26, 1927 (85 y.o. F) Treating RN: Montey Hora Primary Care Scotlynn Noyes: Ria Bush Other Clinician: Referring Shulamit Donofrio: Ria Bush Treating Francee Setzer/Extender: STONE III, HOYT Weeks in Treatment: 30 Edema Assessment Assessed: [Left: No] [Right: No] Edema: [Left: N] [Right: o] Vascular Assessment Pulses: Dorsalis Pedis Palpable: [Right:Yes] Electronic Signature(s) Signed: 10/22/2018 4:17:58 PM By: Montey Hora Entered By: Montey Hora on 10/22/2018 15:30:21 [name], [name] ([id number]) -------------------------------------------------------------------------------- Multi Wound Chart Details Patient Name: [name], [name] Date of Service: 10/22/2018 3:15 PM Medical Record Number: [id number] Patient Account Number: [id number] Date of Birth/Sex: 1927/10/12 (85 y.o. F) Treating RN: Montey Hora Primary Care Hali Balgobin: Ria Bush Other Clinician: Referring Georgean Spainhower: Ria Bush Treating Theodore Rahrig/Extender: STONE III, HOYT Weeks in Treatment: 30 Vital Signs Height(in): 58 Pulse(bpm): 38 Weight(lbs): 140 Blood Pressure(mmHg): 139/73 Body Mass Index(BMI): 29 Temperature(F): 98.1 Respiratory Rate 16 (breaths/min): Photos: [N/A:N/A] Wound Location:  Right Lower Leg - Lateral N/A N/A Wounding Event: Trauma N/A N/A Primary Etiology: Diabetic Wound/Ulcer of the N/A N/A Lower Extremity Comorbid History: Cataracts, Anemia, N/A N/A Arrhythmia, Coronary Artery Disease, Hypertension, Myocardial Infarction, Type II Diabetes, Osteoarthritis Date Acquired: 03/09/2018 N/A N/A Weeks of Treatment: 30 N/A N/A Wound Status: Open N/A N/A Measurements L x W x D 0.6x0.3x0.1 N/A N/A (cm) Area (cm) : 0.141 N/A N/A Volume (cm) : 0.014 N/A N/A % Reduction in Area: 92.50% N/A N/A % Reduction in Volume: 97.50% N/A N/A Classification: Grade 2 N/A N/A Exudate Amount: None Present N/A N/A Wound Margin: Flat and Intact N/A N/A Granulation Amount: None Present (0%) N/A  N/A Necrotic Amount: Large (67-100%) N/A N/A Necrotic Tissue: Eschar, Adherent Slough N/A N/A Exposed Structures: Fat Layer (Subcutaneous N/A N/A Tissue) Exposed: Yes Fascia: No Tendon: No Muscle: No [name], [name] ([id number]) Joint: No Bone: No Epithelialization: Medium (34-66%) N/A N/A Treatment Notes Electronic Signature(s) Signed: 10/22/2018 4:17:58 PM By: Montey Hora Entered By: Montey Hora on 10/22/2018 15:36:06 [name], [name] ([id number]) -------------------------------------------------------------------------------- [place] Details Patient Name: [name], [name] Date of Service: 10/22/2018 3:15 PM Medical Record Number: [id number] Patient Account Number: [id number] Date of Birth/Sex: 1927/06/17 (85 y.o. F) Treating RN: Montey Hora Primary Care Daveyon Kitchings: Ria Bush Other Clinician: Referring Rei Contee: Ria Bush Treating Deya Bigos/Extender: Melburn Hake, HOYT Weeks in Treatment: 30 Active Inactive Abuse / Safety / Falls / Self Care Management Nursing Diagnoses: Potential for falls Goals: Patient will not experience any injury related to falls Date Initiated: 03/23/2018 Target Resolution Date: 06/09/2018 Goal Status:  Active Interventions: Assess fall risk on admission and as needed Notes: Orientation to the Wound Care Program Nursing Diagnoses: Knowledge deficit related to the wound healing center program Goals: Patient/caregiver will verbalize understanding of the [place] Program Date Initiated: 03/23/2018 Target Resolution Date: 06/09/2018 Goal Status: Active Interventions: Provide education on orientation to the wound center Notes: Pain, Acute or Chronic Nursing Diagnoses: Pain, acute or chronic: actual or potential Goals: Patient will verbalize adequate pain control and receive pain control interventions during procedures as needed Date Initiated: 03/23/2018 Target Resolution Date: 06/09/2018 Goal Status: Active Interventions: Assess comfort goal upon admission Soucek, Janette C. ([id number]) Notes: Wound/Skin Impairment Nursing Diagnoses: Impaired tissue integrity Goals: Ulcer/skin breakdown will heal within 14 weeks Date Initiated: 03/23/2018 Target Resolution Date: 06/09/2018 Goal Status: Active Interventions: Assess patient/caregiver ability to obtain necessary supplies Assess patient/caregiver ability to perform ulcer/skin care regimen upon admission and as needed Assess ulceration(s) every visit Notes: Electronic Signature(s) Signed: 10/22/2018 4:17:58 PM By: Montey Hora Entered By: Montey Hora on 10/22/2018 15:35:57 Brethauer, [name] C. ([id number]) -------------------------------------------------------------------------------- Pain Assessment Details Patient Name: [name], [name] Date of Service: 10/22/2018 3:15 PM Medical Record Number: [id number] Patient Account Number: [id number] Date of Birth/Sex: 1927-10-10 (85 y.o. F) Treating RN: Montey Hora Primary Care Romar Woodrick: Ria Bush Other Clinician: Referring Shenay Torti: Ria Bush Treating Acy Orsak/Extender: Melburn Hake, HOYT Weeks in Treatment: 30 Active Problems Location of Pain Severity  and Description of Pain Patient Has [name] Yes Site Locations Pain Location: Pain in Ulcers With Dressing Change: Yes Duration of the Pain. Constant / Intermittento Constant Pain Management and Medication Current Pain Management: Electronic Signature(s) Signed: 10/22/2018 4:17:58 PM By: Montey Hora Entered By: Montey Hora on 10/22/2018 15:21:17 Saldierna, [name] ([id number]) -------------------------------------------------------------------------------- Patient/Caregiver Education Details Patient Name: [name], [name] Date of Service: 10/22/2018 3:15 PM Medical Record Number: [id number] Patient Account Number: [id number] Date of Birth/Gender: 09-15-1927 (85 y.o. F) Treating RN: Montey Hora Primary Care Physician: Ria Bush Other Clinician: Referring Physician: Ria Bush Treating Physician/Extender: Sharalyn Ink in Treatment: 30 Education Assessment Education Provided To: Patient Education Topics Provided Wound/Skin Impairment: Handouts: Other: wound care as ordered Methods: Demonstration, Explain/Verbal Responses: State content correctly Electronic Signature(s) Signed: 10/22/2018 4:17:58 PM By: Montey Hora Entered By: Montey Hora on 10/22/2018 15:41:16 [name], [name] ([id number]) -------------------------------------------------------------------------------- Wound Assessment Details Patient Name: [name], [name] Date of Service: 10/22/2018 3:15 PM Medical Record Number: [id number] Patient Account Number: [id number] Date of Birth/Sex: 07-Dec-1927 (85 y.o. F) Treating RN: Montey Hora Primary Care Hisao Doo: Ria Bush Other Clinician: Referring Sherran Margolis: Ria Bush Treating Donasia Wimes/Extender: Melburn Hake, HOYT Weeks  in Treatment: 30 Wound Status Wound Number: 8 Primary Diabetic Wound/Ulcer of the Lower Extremity Etiology: Wound Location: Right Lower Leg - Lateral Wound Open Wounding Event: Trauma Status: Date  Acquired: 03/09/2018 Comorbid Cataracts, Anemia, Arrhythmia, Coronary Artery Weeks Of Treatment: 30 History: Disease, Hypertension, Myocardial Infarction, Clustered Wound: No Type II Diabetes, Osteoarthritis Photos Wound Measurements Length: (cm) 0.6 % Reduction Width: (cm) 0.3 % Reduction Depth: (cm) 0.1 Epithelializ Area: (cm) 0.141 Tunneling: Volume: (cm) 0.014 Undermining in Area: 92.5% in Volume: 97.5% ation: Medium (34-66%) No : No Wound Description Classification: Grade 2 Foul Odor Af Wound Margin: Flat and Intact Slough/Fibri Exudate Amount: None Present ter Cleansing: No no Yes Wound Bed Granulation Amount: None Present (0%) Exposed Structure Necrotic Amount: Large (67-100%) Fascia Exposed: No Necrotic Quality: Eschar, Adherent Slough Fat Layer (Subcutaneous Tissue) Exposed: Yes Tendon Exposed: No Muscle Exposed: No Joint Exposed: No Bone Exposed: No Treatment Notes Wound #8 (Right, Lateral Lower Leg) Blankenbaker, Gyanna C. ([id number]) Notes xeroform, non adherent pad, conform and patient's own compression Electronic Signature(s) Signed: 10/22/2018 4:17:58 PM By: Montey Hora Entered By: Montey Hora on 10/22/2018 15:30:05 Lackie, [name] ([id number]) -------------------------------------------------------------------------------- Vitals Details Patient Name: Shaikh, Camellia C. Date of Service: 10/22/2018 3:15 PM Medical Record Number: [id number] Patient Account Number: [id number] Date of Birth/Sex: June 15, 1927 (85 y.o. F) Treating RN: Montey Hora Primary Care Garrett Mitchum: Ria Bush Other Clinician: Referring Shaunta Oncale: Ria Bush Treating Tyshawna Alarid/Extender: STONE III, HOYT Weeks in Treatment: 30 Vital Signs Time Taken: 15:22 Temperature (F): 98.1 Height (in): 58 Pulse (bpm): 73 Weight (lbs): 140 Respiratory Rate (breaths/min): 16 Body Mass Index (BMI): 29.3 Blood Pressure (mmHg): 139/73 Reference Range: 80 - 120 mg /  dl Electronic Signature(s) Signed: 10/22/2018 4:17:58 PM By: Montey Hora Entered By: Montey Hora on 10/22/2018 15:24:42

## 2018-10-29 ENCOUNTER — Other Ambulatory Visit: Payer: Self-pay

## 2018-10-29 ENCOUNTER — Encounter: Payer: Medicare Other | Admitting: Physician Assistant

## 2018-10-29 DIAGNOSIS — Z87891 Personal history of nicotine dependence: Secondary | ICD-10-CM | POA: Diagnosis not present

## 2018-10-29 DIAGNOSIS — E11622 Type 2 diabetes mellitus with other skin ulcer: Secondary | ICD-10-CM | POA: Diagnosis not present

## 2018-10-29 DIAGNOSIS — L03115 Cellulitis of right lower limb: Secondary | ICD-10-CM | POA: Diagnosis not present

## 2018-10-29 DIAGNOSIS — L97812 Non-pressure chronic ulcer of other part of right lower leg with fat layer exposed: Secondary | ICD-10-CM | POA: Diagnosis not present

## 2018-10-29 DIAGNOSIS — I87331 Chronic venous hypertension (idiopathic) with ulcer and inflammation of right lower extremity: Secondary | ICD-10-CM | POA: Diagnosis not present

## 2018-10-29 NOTE — Progress Notes (Addendum)
DYLANIE, QUESENBERRY ([id number]) Visit Report for 10/29/2018 Chief Complaint Document Details Patient Name: Baumler, Ashaki C. Date of Service: 10/29/2018 3:00 PM Medical Record Number: [id number] Patient Account Number: [id number] Date of Birth/Sex: 08-05-1927 (85 y.o. F) Treating RN: Harold Barban Primary Care Provider: Ria Bush Other Clinician: Referring Provider: Ria Bush Treating Provider/Extender: Melburn Hake, Kyshaun Barnette Weeks in Treatment: 31 Information Obtained from: Patient Chief Complaint RLE wounds Electronic Signature(s) Signed: 10/29/2018 3:02:17 PM By: Worthy Keeler PA-C Entered By: Worthy Keeler on 10/29/2018 15:02:16 Sansom, [name] ([id number]) -------------------------------------------------------------------------------- Debridement Details Patient Name: Brookover, Lavida C. Date of Service: 10/29/2018 3:00 PM Medical Record Number: [id number] Patient Account Number: [id number] Date of Birth/Sex: October 09, 1927 (85 y.o. F) Treating RN: Harold Barban Primary Care Provider: Ria Bush Other Clinician: Referring Provider: Ria Bush Treating Provider/Extender: Melburn Hake, Kamdyn Covel Weeks in Treatment: 31 Debridement Performed for Wound #8 Right,Lateral Lower Leg Assessment: Performed By: Physician STONE III, Kashia Brossard E., PA-C Debridement Type: Debridement Severity of Tissue Pre Fat layer exposed Debridement: Level of Consciousness (Pre- Awake and Alert procedure): Pre-procedure Verification/Time Yes - 15:20 Out Taken: Start Time: 15:20 Pain Control: Lidocaine Total Area Debrided (L x W): 0.4 (cm) x 0.2 (cm) = 0.08 (cm) Tissue and other material Viable, Non-Viable, Slough, Subcutaneous, Slough debrided: Level: Skin/Subcutaneous Tissue Debridement Description: Excisional Instrument: Curette Bleeding: None End Time: 15:22 Procedural Pain: 0 Post Procedural Pain: 0 Response to Treatment: Procedure was tolerated well Level of  Consciousness Awake and Alert (Post-procedure): Post Debridement Measurements of Total Wound Length: (cm) 0.4 Width: (cm) 0.2 Depth: (cm) 0.1 Volume: (cm) 0.006 Character of Wound/Ulcer Post Debridement: Improved Severity of Tissue Post Debridement: Fat layer exposed Post Procedure Diagnosis Same as Pre-procedure Electronic Signature(s) Signed: 10/30/2018 4:21:20 PM By: Harold Barban Signed: 10/31/2018 9:47:59 AM By: Worthy Keeler PA-C Entered By: Harold Barban on 10/29/2018 15:22:07 Riccardi, Ondine C. ([id number]) -------------------------------------------------------------------------------- HPI Details Patient Name: Villafranca, Renad C. Date of Service: 10/29/2018 3:00 PM Medical Record Number: [id number] Patient Account Number: [id number] Date of Birth/Sex: [birth year] (85 y.o. F) Treating RN: Harold Barban Primary Care Provider: Ria Bush Other Clinician: Referring Provider: Ria Bush Treating Provider/Extender: Melburn Hake, Jacquelin Krajewski Weeks in Treatment: 31 History of Present Illness HPI Description: Readmission: 03/23/18 on evaluation today patient presents for readmission entire clinic concerning issues that she has been having for the past couple of weeks in regard to her right lower extremity. The medial injury actually occurred when her dog tried to take a blanket from her and subsequently scratched her with his toenail. Subsequently the lateral injury was a wound where she dropped a knife in her kitchen and hit the side of her leg. With that being said both have been open for some time and home health nurse that has been coming out marked for her where the redness was several days ago. Nonetheless this has spread will be on the margin of what was marked. She was placed on Keflex which was started two days ago but at this point it does not seem to have helped with any improvement in the overall appearance of the wounds. No fevers, chills, nausea, or vomiting  noted at this time. 04/02/18 Seen today for follow up and management of right lower extremity wounds. Right lateral wound with adherent slough. She is complaining of pain to touch which limited exam. Recently treated in the ED for cellulitis and wound culture positive for MRSA. Completed a dose of Vancomycin and transitioned to doxycycline by mouth. Erythema remains present with improvement since  last visit. Right medial wound with scab present. No fevers, chills, nausea, or vomiting noted at this time. 04/10/18 on evaluation today patient appears to be doing a Mo better in regard to her right lateral lower [name]. The slough does seem to be coming off although it is slow from the wound bed. Fortunately there does not appear to be any evidence of infection at this time which is good news. With that being said I do believe that she is going to require longer treatment with the Santyl simply due to the fact that I'm not able to really debride the wound due to the pain that she is experiencing. 04/16/18 upon evaluation today patient's wound bed currently on the lateral lower extremity on the right appears to be doing a Tiley better at this point. We are utilizing sensible currently. Overall I'm very pleased with how things are progressing although there still is some work to do before this will be completely healed. No fevers, chills, nausea, or vomiting noted at this time. 04/23/18 on evaluation today patient appears to be doing about the same at this point maybe just a Vasey bit smaller than last week's evaluation. She continues to have some slough buildup on the surface of the wound and has very Aeschliman granulation unfortunately. This seems to be very slow moving I'm getting somewhat concerned about the possibility of her having decreased blood flow which is leading to her subsequently not being able to heal this area as quickly as she shut otherwise. She is currently utilizing  Santyl. 05/01/18 on evaluation today patient appears to be doing somewhat poorly in regard to her lower extremity ulcer. At this point she's been tolerating the dressing changes to some degree without complication. With that being said she does note of the past several days that she has been having more pain unfortunately. On inspection it appears that she may have signs of infection which would account for the pain. 05/07/18 on evaluation today patient's wound actually appears to be doing better she's not having as much erythema in the right lower extremity as compared to last week. Fortunately there's no signs of infection this is good news as well. Overall very pleased with the progress that she has made up to this point. 05/14/18 on evaluation today patient actually appears to be doing rather well in regard to her right lower extremity ulcer. She does not have erythema that was characterizing the cellulitis prior to initiation of antibiotic therapy. With that being said her wound still appears to be somewhat slow in healing I believe this may be arterial in nature that is my concern at least. With that being said I do want her to see the vascular specialist as soon as possible we been trying to get this set up I'm not sure if she's not answering the phone or what exactly is going on here. KRYSTALL, KRUCKENBERG ([id number]) 05/21/18 on evaluation today patient's wound actually appears to be doing a Gunnerson better possibly some additional granulation compared to my last evaluation. With that being said she still continues to have a lot of pain especially when her legs are elevated such as sitting in the chair here in the office. There does not appear to be any signs of infection which is good news. 05/28/18 on evaluation today patient appears to be doing a Bresee better in regard to her right lateral lower [name]. Again she was supposed to have an appointment with vascular unfortunately they  attempted to  call her three times and unfortunately were unable to get anything scheduled. That reason closed up for him. She tells me that if the phone is answered and she doesn't recognize what is that she will hang up as well those that she lives with. Obviously she's not gonna recognize somebody calling from the office that she has not previously spoken with. Nonetheless the patient states that she does not remember any fall. I think if we're gonna get her in with vascular specialist we're gonna have to try to make the appointment for her. 06/04/2018 Seen today for follow up and management of right lateral lower extremity wound. Adherent slough present over surface of wound. Scheduled to follow up for ABI/TBI on 06/06/2018 with vein and vascular specialist. She is hard of hearing. Wound is sensitive to touch. No signs or symptoms of infection. Denies any recent fevers, chills, or SOB. 06/11/18 on evaluation today patient actually appears to be doing very well in regard to her right lateral lower extremity ulcer. She's been tolerating the dressing changes without complication. Fortunately there's no evidence of infection. With that being said she did have her arterial studies which revealed that she had a very low ABI which was 0.67 on the right with a TBI of 0.32 and 0.65 on the left with a TBI of 0.16. I do believe she needs to see vascular and we are working on that referral as well. 06/21/18 on evaluation today patient's wound actually appears to be doing okay she does have an appointment with vascular coming up on the 24th. Nonetheless she's been having some issues that are more urinary in nature right now that are causing trouble for her. Fortunately there's no signs of severe sepsis although she is running a low-grade temperature 99.9 on evaluation today. She also is having some issues right now with chills as well as urinary urgency and frequency. Again I feel like the city is more urinary and  symptomology I do not believe she has anything upper respiratory which is good news. Nonetheless I think this may need to be checked out more quickly especially in light of the fact that she's having significant back pain at this point. 07/02/18 on evaluation today patient appears to be doing okay in regard to her ulcer on the right lower extremity. She's been tolerating the dressing changes without complication. Fortunately there does not appear to be any signs of infection which is good news. No fevers, chills, nausea, or vomiting noted at this time. 07/10/18 on evaluation today patient appears to be doing well in regard to her right lateral lower extremity ulcer all things considering. She's been using this form this seems to be the one thing that she can really do on her own and she seems to be making some progress obviously help with this matter. I think the biggest issue limiting her feeling speed is her blood flow she canceled her original appointment with vascular her next one is not till May. She was worried about the Covid-19 Virus and having to see another physician. Nonetheless there's no signs of active infection at this time. 07/16/18 on evaluation today patient appears to be doing okay in regard to her right lateral lower extremity ulcer. She's been tolerating the dressing changes without complication. Fortunately there's no signs of active infection at this time. No fevers, chills, nausea, or vomiting noted at this time. 07/23/18 on evaluation today patient appears to be doing very well in regard to her right lateral load should be  ulcer all things considering. She still again is not seeing vascular until mid May in the meantime she's been utilizing the over-the-counter tripling about appointment which seems to be the only thing she's able to do routinely on her own without getting confused or frustrated. Overall it seems to be doing okay fortunately she's showing no signs of  infection. 07/30/18 upon evaluation today patient actually appears to be doing about the same in regard to her wound in general. She is making very slow progress most likely again due to the issue with her blood flow she sees vascular towards the middle of news. In the meantime we've really been trying to just keep things in the control from the infection standpoint and allow for any healing that could take place up until appointment she can see vascular. 08/06/18 upon evaluation today patient's wound actually appears to be doing about the same although the wound bed may show Haskell bit more granulation which is good news. Fortunately there's no signs of active infection at this time. No fevers, chills, nausea, or vomiting noted at this time. 08/14/18 on evaluation today patient actually appears to be doing about the same in regard to her lower extremity ulcer. Sellen, Delphina C. ([id number]) Fortunately there's no signs of active infection at this time which is good news. Fortunately the patient has been tolerating the dressing changes without complication. No fevers, chills, nausea, or vomiting noted at this time. She does have her appointment with vascular next week. 08/20/18 on evaluation today patient actually appears to be doing a Signorelli better in my opinion with regard to her lower extremity ulcer. She's been tolerating the dressing changes without complication. Fortunately there's no signs of active infection at this time which is good news. She actually will be seen vascular tomorrow. 08/30/18 on evaluation today patient's wound actually appears to be showing some signs of improvement which is good news. Fortunately there's no signs of infection. She does the the vascular doctor on June 4. 09/10/18 on evaluation today patient is status post having had an angiogram with Dr. Lucky Cowboy. Unfortunately she tells me that she still having a lot of discomfort although the wound bed itself seems to be doing  better. She does have more swelling of the right lower extremity but nothing too significant. I think this is causing Zaffino bit of increased pain right now as well that is unfortunate. No fevers, chills, nausea, or vomiting noted at this time. 09/17/18 on evaluation today patient actually appears to be doing much better today compared to last week's evaluation. I'm very pleased in this regard. Fortunately there's no signs of active infection which is also good news. 09/24/18 on evaluation today patient appears to be doing well in regard to the wounds on the right lateral lower extremity. Since her vascular intervention I do feel like she's made excellent progress. Fortunately there's no signs of active infection at this time. No fevers, chills, nausea, or vomiting noted at this time. 10/01/18 on evaluation today patient actually appears to be doing well in regard to right lateral lower Trinity ulcers. I think it's the right time at this point to go ahead and switch to [facility] as the dressing of choice. We've been utilizing the alginate and that has been beneficial but she's not noticing as much drainage in fact it's actually staying more dry at this point. We're gonna see how things do with the dressing change I'll see her back next week I'll be seeing that regard in the meantime  she continues to be depressed we did talk about the possibility of seeing a psychiatrist actually has one who she sees and has seen for quite a while. She actually sees her later this month. 10/08/18 on evaluation today patient's wound bed actually showed signs of improvement really at both locations. She has been getting better week by week and her pain is also not nearly as bad as what it was in the past. Overall I think that the vascular intervention has been extremely beneficial for her. 10/15/18-Patient returns at 1 week, she has currently 2 small wounds on the right leg both of which appear to be improving but continuing  silver collagen to this area 10/22/18 on evaluation today patient actually appears to be doing better with regard to her right lower extremity ulcers. Both are measuring smaller compared to when I last saw her. Fortunately she is not having as much pain as she has in the past in fact she did not even really talk about the pain today which is actually the first time that that has occurred. Normally the entire conversation is centered around how much he hurts. Overall she seemed to be in [place] better spirits in my pinion. 10/29/2018 on evaluation today patient actually appears to be doing quite well with regard to her right lower extremity ulcers. In fact both locations that we have been treating and taking care of up to this point seem to be doing excellent. There does not appear to be any signs of active infection and in fact both appear to be very close to complete closure. I believe both may have slight opening still remaining obviously we are going to check this further today. Electronic Signature(s) Signed: 10/29/2018 5:21:48 PM By: Worthy Keeler PA-C Entered By: Worthy Keeler on 10/29/2018 17:21:48 Nix, [name] ([id number]) -------------------------------------------------------------------------------- Physical Exam Details Patient Name: Coote, Volanda C. Date of Service: 10/29/2018 3:00 PM Medical Record Number: [id number] Patient Account Number: [id number] Date of Birth/Sex: 22-Apr-1927 (85 y.o. F) Treating RN: Harold Barban Primary Care Provider: Ria Bush Other Clinician: Referring Provider: Ria Bush Treating Provider/Extender: STONE III, Audelia Knape Weeks in Treatment: 39 Constitutional Well-nourished and well-hydrated in no acute distress. Respiratory normal breathing without difficulty. Psychiatric this patient is able to make decisions and demonstrates good insight into disease process. Alert and Oriented x 3. pleasant and cooperative. Notes With regard to  the patient's wound bed I did actually perform some sharp debridement to clear away dry skin over the surface of the wound. I did actually expose a small opening at each site where she did have a [place] bit of slough and this was debrided down to a good subcutaneous layer which is minimal but nonetheless still open and present. Subsequently I think that it would be appropriate for [place] to continue with her current treatment with the Xeroform this seems to be keeping things under excellent control as far as I am concerned. Electronic Signature(s) Signed: 10/29/2018 5:23:02 PM By: Worthy Keeler PA-C Entered By: Worthy Keeler on 10/29/2018 17:23:02 Lanzer, [name] ([id number]) -------------------------------------------------------------------------------- Physician Orders Details Patient Name: Stauber, Phiona C. Date of Service: 10/29/2018 3:00 PM Medical Record Number: [id number] Patient Account Number: [id number] Date of Birth/Sex: 1928/02/22 (85 y.o. F) Treating RN: Harold Barban Primary Care Provider: Ria Bush Other Clinician: Referring Provider: Ria Bush Treating Provider/Extender: Melburn Hake, Jarret Torre Weeks in Treatment: 31 Verbal / Phone Orders: No Diagnosis Coding ICD-10 Coding Code Description I87.331 Chronic venous hypertension (idiopathic) with ulcer and inflammation of  right lower extremity E11.622 Type 2 diabetes mellitus with other skin ulcer L97.812 Non-pressure chronic ulcer of other part of right lower leg with fat layer exposed L03.115 Cellulitis of right lower limb Wound Cleansing Wound #8 Right,Lateral Lower Leg o Clean wound with Normal Saline. Anesthetic (add to Medication List) Wound #8 Right,Lateral Lower Leg o Topical Lidocaine 4% cream applied to wound bed prior to debridement (In Clinic Only). Primary Wound Dressing Wound #8 Right,Lateral Lower Leg o Xeroform Secondary Dressing Wound #8 Right,Lateral Lower Leg o Conform/Kerlix o  Other - Stretchy to secure Dressing Change Frequency Wound #8 Right,Lateral Lower Leg o Change Dressing Monday, Wednesday, Friday Follow-up Appointments Wound #8 Right,Lateral Lower Leg o Return Appointment in 1 week. Edema Control Wound #8 Right,Lateral Lower Leg o Elevate legs to the level of the heart and pump ankles as often as possible Home Health Wound #8 Right,Lateral Lower Leg o [place] Nurse may visit PRN to address patientos wound care needs. TEFFANY, BLASZCZYK ([id number]) o FACE TO FACE ENCOUNTER: MEDICARE and MEDICAID PATIENTS: I certify that this patient is under my care and that I had a face-to-face encounter that meets the physician face-to-face encounter requirements with this patient on this date. The encounter with the patient was in whole or in part for the following MEDICAL CONDITION: (primary reason for [place]) MEDICAL NECESSITY: I certify, that based on my findings, NURSING services are a medically necessary home health service. HOME BOUND STATUS: I certify that my clinical findings support that this patient is homebound (i.e., Due to illness or injury, pt requires aid of supportive devices such as crutches, cane, wheelchairs, walkers, the use of special transportation or the assistance of another person to leave their place of residence. There is a normal inability to leave the home and doing so requires considerable and taxing effort. Other absences are for medical reasons / religious services and are infrequent or of short duration when for other reasons). o If current dressing causes regression in wound condition, may D/C ordered dressing product/s and apply Normal Saline Moist Dressing daily until next [place] / Other MD appointment. [place] of regression in wound condition at [phone]. o Please direct any NON-WOUND related issues/requests for orders to patient's  Primary Care Physician Electronic Signature(s) Signed: 10/30/2018 4:21:20 PM By: Harold Barban Signed: 10/31/2018 9:47:59 AM By: Worthy Keeler PA-C Entered By: Harold Barban on 10/29/2018 15:23:22 Proffit, Kamica C. ([id number]) -------------------------------------------------------------------------------- Problem List Details Patient Name: Kreps, Shaila C. Date of Service: 10/29/2018 3:00 PM Medical Record Number: [id number] Patient Account Number: [id number] Date of Birth/Sex: [birth year] (85 y.o. F) Treating RN: Harold Barban Primary Care Provider: Ria Bush Other Clinician: Referring Provider: Ria Bush Treating Provider/Extender: Melburn Hake, Ionia Schey Weeks in Treatment: 31 Active Problems ICD-10 Evaluated Encounter Code Description Active Date Today Diagnosis I87.331 Chronic venous hypertension (idiopathic) with ulcer and 03/23/2018 No Yes inflammation of right lower extremity E11.622 Type 2 diabetes mellitus with other skin ulcer 03/23/2018 No Yes L97.812 Non-pressure chronic ulcer of other part of right lower leg 03/23/2018 No Yes with fat layer exposed L03.115 Cellulitis of right lower limb 03/23/2018 No Yes Inactive Problems Resolved Problems Electronic Signature(s) Signed: 10/29/2018 3:01:57 PM By: Worthy Keeler PA-C Entered By: Worthy Keeler on 10/29/2018 15:01:57 Lingard, [name] ([id number]) -------------------------------------------------------------------------------- Progress Note Details Patient Name: Maciejewski, Narissa C. Date of Service: 10/29/2018 3:00 PM Medical Record Number: [id number] Patient Account Number: [id number] Date of Birth/Sex: 07-17-1927 (85  y.o. F) Treating RN: Harold Barban Primary Care Provider: Ria Bush Other Clinician: Referring Provider: Ria Bush Treating Provider/Extender: Melburn Hake, Demarques Pilz Weeks in Treatment: 31 Subjective Chief Complaint Information obtained from Patient RLE wounds History of  Present Illness (HPI) Readmission: 03/23/18 on evaluation today patient presents for readmission entire clinic concerning issues that she has been having for the past couple of weeks in regard to her right lower extremity. The medial injury actually occurred when her dog tried to take a blanket from her and subsequently scratched her with his toenail. Subsequently the lateral injury was a wound where she dropped a knife in her kitchen and hit the side of her leg. With that being said both have been open for some time and home health nurse that has been coming out marked for her where the redness was several days ago. Nonetheless this has spread will be on the margin of what was marked. She was placed on Keflex which was started two days ago but at this point it does not seem to have helped with any improvement in the overall appearance of the wounds. No fevers, chills, nausea, or vomiting noted at this time. 04/02/18 Seen today for follow up and management of right lower extremity wounds. Right lateral wound with adherent slough. She is complaining of pain to touch which limited exam. Recently treated in the ED for cellulitis and wound culture positive for MRSA. Completed a dose of Vancomycin and transitioned to doxycycline by mouth. Erythema remains present with improvement since last visit. Right medial wound with scab present. No fevers, chills, nausea, or vomiting noted at this time. 04/10/18 on evaluation today patient appears to be doing a Donley better in regard to her right lateral lower [name]. The slough does seem to be coming off although it is slow from the wound bed. Fortunately there does not appear to be any evidence of infection at this time which is good news. With that being said I do believe that she is going to require longer treatment with the Santyl simply due to the fact that I'm not able to really debride the wound due to the pain that she is experiencing. 04/16/18  upon evaluation today patient's wound bed currently on the lateral lower extremity on the right appears to be doing a Tamburrino better at this point. We are utilizing sensible currently. Overall I'm very pleased with how things are progressing although there still is some work to do before this will be completely healed. No fevers, chills, nausea, or vomiting noted at this time. 04/23/18 on evaluation today patient appears to be doing about the same at this point maybe just a Naraine bit smaller than last week's evaluation. She continues to have some slough buildup on the surface of the wound and has very Dina granulation unfortunately. This seems to be very slow moving I'm getting somewhat concerned about the possibility of her having decreased blood flow which is leading to her subsequently not being able to heal this area as quickly as she shut otherwise. She is currently utilizing Santyl. 05/01/18 on evaluation today patient appears to be doing somewhat poorly in regard to her lower extremity ulcer. At this point she's been tolerating the dressing changes to some degree without complication. With that being said she does note of the past several days that she has been having more pain unfortunately. On inspection it appears that she may have signs of infection which would account for the pain. 05/07/18 on evaluation today patient's  wound actually appears to be doing better she's not having as much erythema in the right lower extremity as compared to last week. Fortunately there's no signs of infection this is good news as well. Overall very pleased with the progress that she has made up to this point. MACKIE, GOON ([id number]) 05/14/18 on evaluation today patient actually appears to be doing rather well in regard to her right lower extremity ulcer. She does not have erythema that was characterizing the cellulitis prior to initiation of antibiotic therapy. With that being said her wound still  appears to be somewhat slow in healing I believe this may be arterial in nature that is my concern at least. With that being said I do want her to see the vascular specialist as soon as possible we been trying to get this set up I'm not sure if she's not answering the phone or what exactly is going on here. 05/21/18 on evaluation today patient's wound actually appears to be doing a Finkel better possibly some additional granulation compared to my last evaluation. With that being said she still continues to have a lot of pain especially when her legs are elevated such as sitting in the chair here in the office. There does not appear to be any signs of infection which is good news. 05/28/18 on evaluation today patient appears to be doing a Mchaney better in regard to her right lateral lower [name]. Again she was supposed to have an appointment with vascular unfortunately they attempted to call her three times and unfortunately were unable to get anything scheduled. That reason closed up for him. She tells me that if the phone is answered and she doesn't recognize what is that she will hang up as well those that she lives with. Obviously she's not gonna recognize somebody calling from the office that she has not previously spoken with. Nonetheless the patient states that she does not remember any fall. I think if we're gonna get her in with vascular specialist we're gonna have to try to make the appointment for her. 06/04/2018 Seen today for follow up and management of right lateral lower extremity wound. Adherent slough present over surface of wound. Scheduled to follow up for ABI/TBI on 06/06/2018 with vein and vascular specialist. She is hard of hearing. Wound is sensitive to touch. No signs or symptoms of infection. Denies any recent fevers, chills, or SOB. 06/11/18 on evaluation today patient actually appears to be doing very well in regard to her right lateral lower extremity ulcer. She's been  tolerating the dressing changes without complication. Fortunately there's no evidence of infection. With that being said she did have her arterial studies which revealed that she had a very low ABI which was 0.67 on the right with a TBI of 0.32 and 0.65 on the left with a TBI of 0.16. I do believe she needs to see vascular and we are working on that referral as well. 06/21/18 on evaluation today patient's wound actually appears to be doing okay she does have an appointment with vascular coming up on the 24th. Nonetheless she's been having some issues that are more urinary in nature right now that are causing trouble for her. Fortunately there's no signs of severe sepsis although she is running a low-grade temperature 99.9 on evaluation today. She also is having some issues right now with chills as well as urinary urgency and frequency. Again I feel like the city is more urinary and symptomology I do not believe she  has anything upper respiratory which is good news. Nonetheless I think this may need to be checked out more quickly especially in light of the fact that she's having significant back pain at this point. 07/02/18 on evaluation today patient appears to be doing okay in regard to her ulcer on the right lower extremity. She's been tolerating the dressing changes without complication. Fortunately there does not appear to be any signs of infection which is good news. No fevers, chills, nausea, or vomiting noted at this time. 07/10/18 on evaluation today patient appears to be doing well in regard to her right lateral lower extremity ulcer all things considering. She's been using this form this seems to be the one thing that she can really do on her own and she seems to be making some progress obviously help with this matter. I think the biggest issue limiting her feeling speed is her blood flow she canceled her original appointment with vascular her next one is not till May. She was worried about  the Covid-19 Virus and having to see another physician. Nonetheless there's no signs of active infection at this time. 07/16/18 on evaluation today patient appears to be doing okay in regard to her right lateral lower extremity ulcer. She's been tolerating the dressing changes without complication. Fortunately there's no signs of active infection at this time. No fevers, chills, nausea, or vomiting noted at this time. 07/23/18 on evaluation today patient appears to be doing very well in regard to her right lateral load should be ulcer all things considering. She still again is not seeing vascular until mid May in the meantime she's been utilizing the over-the-counter tripling about appointment which seems to be the only thing she's able to do routinely on her own without getting confused or frustrated. Overall it seems to be doing okay fortunately she's showing no signs of infection. 07/30/18 upon evaluation today patient actually appears to be doing about the same in regard to her wound in general. She is making very slow progress most likely again due to the issue with her blood flow she sees vascular towards the middle of news. In the meantime we've really been trying to just keep things in the control from the infection standpoint and allow for any healing that could take place up until appointment she can see vascular. ILEANE, SANDO ([id number]) 08/06/18 upon evaluation today patient's wound actually appears to be doing about the same although the wound bed may show Goetze bit more granulation which is good news. Fortunately there's no signs of active infection at this time. No fevers, chills, nausea, or vomiting noted at this time. 08/14/18 on evaluation today patient actually appears to be doing about the same in regard to her lower extremity ulcer. Fortunately there's no signs of active infection at this time which is good news. Fortunately the patient has been tolerating the dressing  changes without complication. No fevers, chills, nausea, or vomiting noted at this time. She does have her appointment with vascular next week. 08/20/18 on evaluation today patient actually appears to be doing a Domingos better in my opinion with regard to her lower extremity ulcer. She's been tolerating the dressing changes without complication. Fortunately there's no signs of active infection at this time which is good news. She actually will be seen vascular tomorrow. 08/30/18 on evaluation today patient's wound actually appears to be showing some signs of improvement which is good news. Fortunately there's no signs of infection. She does the the vascular doctor on  June 4. 09/10/18 on evaluation today patient is status post having had an angiogram with Dr. Lucky Cowboy. Unfortunately she tells me that she still having a lot of discomfort although the wound bed itself seems to be doing better. She does have more swelling of the right lower extremity but nothing too significant. I think this is causing Bazen bit of increased pain right now as well that is unfortunate. No fevers, chills, nausea, or vomiting noted at this time. 09/17/18 on evaluation today patient actually appears to be doing much better today compared to last week's evaluation. I'm very pleased in this regard. Fortunately there's no signs of active infection which is also good news. 09/24/18 on evaluation today patient appears to be doing well in regard to the wounds on the right lateral lower extremity. Since her vascular intervention I do feel like she's made excellent progress. Fortunately there's no signs of active infection at this time. No fevers, chills, nausea, or vomiting noted at this time. 10/01/18 on evaluation today patient actually appears to be doing well in regard to right lateral lower Trinity ulcers. I think it's the right time at this point to go ahead and switch to [facility] as the dressing of choice. We've been utilizing the  alginate and that has been beneficial but she's not noticing as much drainage in fact it's actually staying more dry at this point. We're gonna see how things do with the dressing change I'll see her back next week I'll be seeing that regard in the meantime she continues to be depressed we did talk about the possibility of seeing a psychiatrist actually has one who she sees and has seen for quite a while. She actually sees her later this month. 10/08/18 on evaluation today patient's wound bed actually showed signs of improvement really at both locations. She has been getting better week by week and her pain is also not nearly as bad as what it was in the past. Overall I think that the vascular intervention has been extremely beneficial for her. 10/15/18-Patient returns at 1 week, she has currently 2 small wounds on the right leg both of which appear to be improving but continuing silver collagen to this area 10/22/18 on evaluation today patient actually appears to be doing better with regard to her right lower extremity ulcers. Both are measuring smaller compared to when I last saw her. Fortunately she is not having as much pain as she has in the past in fact she did not even really talk about the pain today which is actually the first time that that has occurred. Normally the entire conversation is centered around how much he hurts. Overall she seemed to be in Jahn better spirits in my pinion. 10/29/2018 on evaluation today patient actually appears to be doing quite well with regard to her right lower extremity ulcers. In fact both locations that we have been treating and taking care of up to this point seem to be doing excellent. There does not appear to be any signs of active infection and in fact both appear to be very close to complete closure. I believe both may have slight opening still remaining obviously we are going to check this further today. Patient History Information obtained from  Patient. Family History Diabetes - Father,Siblings,Child, Hypertension - Child, Lung Disease - Child, Stroke - Mother, No family history of Cancer, Heart Disease, Kidney Disease, Seizures, Thyroid Problems. AMORIE, RENTZ ([id number]) Social History Former smoker, Marital Status - Single, Alcohol Use - Never,  Drug Use - No History, Caffeine Use - Daily. Medical History Eyes Patient has history of Cataracts - removed Denies history of Glaucoma, Optic Neuritis Hematologic/Lymphatic Patient has history of Anemia Denies history of Hemophilia, Human Immunodeficiency Virus, Lymphedema, Sickle Cell Disease Respiratory Denies history of Aspiration, Asthma, Chronic Obstructive Pulmonary Disease (COPD), Pneumothorax, Sleep Apnea, Tuberculosis Cardiovascular Patient has history of Arrhythmia, Coronary Artery Disease, Hypertension, Myocardial Infarction Denies history of Angina, Congestive Heart Failure, Deep Vein Thrombosis, Hypotension, Peripheral Arterial Disease, Peripheral Venous Disease, Phlebitis Endocrine Patient has history of Type II Diabetes Denies history of Type I Diabetes Genitourinary Denies history of End Stage Renal Disease Immunological Denies history of Lupus Erythematosus, Raynaud s, Scleroderma Integumentary (Skin) Denies history of History of Burn, History of pressure wounds Musculoskeletal Patient has history of Osteoarthritis Denies history of Gout, Rheumatoid Arthritis, Osteomyelitis Neurologic Denies history of Dementia, Neuropathy, Quadriplegia, Paraplegia, Seizure Disorder Oncologic Denies history of Received Chemotherapy, Received Radiation Medical And Surgical History Notes Constitutional Symptoms (General Health) High Blood Pressure, open heart surgery, gall stone, hysterectomy; Fluid pill, Anxiety Review of Systems (ROS) Constitutional Symptoms (General Health) Denies complaints or symptoms of Fatigue, Fever, Chills, Marked Weight  Change. Respiratory Denies complaints or symptoms of Chronic or frequent coughs, Shortness of Breath. Cardiovascular Complains or has symptoms of LE edema. Denies complaints or symptoms of Chest pain. Psychiatric Denies complaints or symptoms of Anxiety, Claustrophobia. Objective Sangiovanni, Suzann C. ([id number]) Constitutional Well-nourished and well-hydrated in no acute distress. Vitals Time Taken: 3:04 PM, Height: 58 in, Weight: 140 lbs, BMI: 29.3, Temperature: 98.5 F, Pulse: 89 bpm, Respiratory Rate: 16 breaths/min, Blood Pressure: 121/77 mmHg. Respiratory normal breathing without difficulty. Psychiatric this patient is able to make decisions and demonstrates good insight into disease process. Alert and Oriented x 3. pleasant and cooperative. General Notes: With regard to the patient's wound bed I did actually perform some sharp debridement to clear away dry skin over the surface of the wound. I did actually expose a small opening at each site where she did have a Peugh bit of slough and this was debrided down to a good subcutaneous layer which is minimal but nonetheless still open and present. Subsequently I think that it would be appropriate for [place] to continue with her current treatment with the Xeroform this seems to be keeping things under excellent control as far as I am concerned. Integumentary (Hair, Skin) Wound #8 status is Open. Original cause of wound was Trauma. The wound is located on the Right,Lateral Lower Leg. The wound measures 0.4cm length x 0.2cm width x 0.1cm depth; 0.063cm^2 area and 0.006cm^3 volume. There is Fat Layer (Subcutaneous Tissue) Exposed exposed. There is no tunneling or undermining noted. There is a none present amount of drainage noted. The wound margin is flat and intact. There is no granulation within the wound bed. There is a large (67-100%) amount of necrotic tissue within the wound bed including Eschar. Assessment Active  Problems ICD-10 Chronic venous hypertension (idiopathic) with ulcer and inflammation of right lower extremity Type 2 diabetes mellitus with other skin ulcer Non-pressure chronic ulcer of other part of right lower leg with fat layer exposed Cellulitis of right lower limb Procedures Wound #8 Pre-procedure diagnosis of Wound #8 is a Diabetic Wound/Ulcer of the Lower Extremity located on the Right,Lateral Lower Leg .Severity of Tissue Pre Debridement is: Fat layer exposed. There was a Excisional Skin/Subcutaneous Tissue Debridement with a total area of 0.08 sq cm performed by STONE III, Nayson Traweek E., PA-C. With the following instrument(s): Curette  to remove Viable and Non-Viable tissue/material. Material removed includes Subcutaneous Tissue and Slough and after achieving pain control using Lidocaine. No specimens were taken. A time out was conducted at 15:20, prior to the start of the procedure. There was no bleeding. The procedure was tolerated well with a pain level of 0 throughout and a pain level of 0 following the procedure. Post Debridement Measurements: 0.4cm length x 0.2cm width x 0.1cm depth; 0.006cm^3 volume. CARMELITE, VIOLET ([id number]) Character of Wound/Ulcer Post Debridement is improved. Severity of Tissue Post Debridement is: Fat layer exposed. Post procedure Diagnosis Wound #8: Same as Pre-Procedure Plan Wound Cleansing: Wound #8 Right,Lateral Lower Leg: Clean wound with Normal Saline. Anesthetic (add to Medication List): Wound #8 Right,Lateral Lower Leg: Topical Lidocaine 4% cream applied to wound bed prior to debridement (In Clinic Only). Primary Wound Dressing: Wound #8 Right,Lateral Lower Leg: Xeroform Secondary Dressing: Wound #8 Right,Lateral Lower Leg: Conform/Kerlix Other - Stretchy to secure Dressing Change Frequency: Wound #8 Right,Lateral Lower Leg: Change Dressing Monday, Wednesday, Friday Follow-up Appointments: Wound #8 Right,Lateral Lower Leg: Return  Appointment in 1 week. Edema Control: Wound #8 Right,Lateral Lower Leg: Elevate legs to the level of the heart and pump ankles as often as possible Home Health: Wound #8 Right,Lateral Lower Leg: [place] Nurse may visit PRN to address patient s wound care needs. FACE TO FACE ENCOUNTER: MEDICARE and MEDICAID PATIENTS: I certify that this patient is under my care and that I had a face-to-face encounter that meets the physician face-to-face encounter requirements with this patient on this date. The encounter with the patient was in whole or in part for the following MEDICAL CONDITION: (primary reason for [place]) MEDICAL NECESSITY: I certify, that based on my findings, NURSING services are a medically necessary home health service. HOME BOUND STATUS: I certify that my clinical findings support that this patient is homebound (i.e., Due to illness or injury, pt requires aid of supportive devices such as crutches, cane, wheelchairs, walkers, the use of special transportation or the assistance of another person to leave their place of residence. There is a normal inability to leave the home and doing so requires considerable and taxing effort. Other absences are for medical reasons / religious services and are infrequent or of short duration when for other reasons). If current dressing causes regression in wound condition, may D/C ordered dressing product/s and apply Normal Saline Moist Dressing daily until next Sandy / Other MD appointment. Hurt of regression in wound condition at [phone]. Please direct any NON-WOUND related issues/requests for orders to patient's Primary Care Physician 1. I am in a suggest that we continue with the Xeroform dressing at this time since she seems to be doing well I think this is absolutely appropriate. 2. With regard to the lower extremity swelling I do believe that her continuing to  wear her compression stocking is absolutely appropriate and I feel like she is doing well with this so we will continue in that regard. I hope that she may be healed next week and just be able to transition into her compression stocking as is. Astacio, Braylea C. ([id number]) We will see patient back for reevaluation in 1 week here in the clinic. If anything worsens or changes patient will contact our office for additional recommendations. Electronic Signature(s) Signed: 10/29/2018 5:23:32 PM By: Worthy Keeler PA-C Entered By: Worthy Keeler on 10/29/2018 17:23:31 Bur, [name] ([id number]) -------------------------------------------------------------------------------- ROS/PFSH Details Patient Name:  Fielding, Syanna C. Date of Service: 10/29/2018 3:00 PM Medical Record Number: [id number] Patient Account Number: [id number] Date of Birth/Sex: 1927-06-28 (85 y.o. F) Treating RN: Harold Barban Primary Care Provider: Ria Bush Other Clinician: Referring Provider: Ria Bush Treating Provider/Extender: STONE III, Mayme Profeta Weeks in Treatment: 31 Information Obtained From Patient Constitutional Symptoms (General Health) Complaints and Symptoms: Negative for: Fatigue; Fever; Chills; Marked Weight Change Medical History: Past Medical History Notes: High Blood Pressure, open heart surgery, gall stone, hysterectomy; Fluid pill, Anxiety Respiratory Complaints and Symptoms: Negative for: Chronic or frequent coughs; Shortness of Breath Medical History: Negative for: Aspiration; Asthma; Chronic Obstructive Pulmonary Disease (COPD); Pneumothorax; Sleep Apnea; Tuberculosis Cardiovascular Complaints and Symptoms: Positive for: LE edema Negative for: Chest pain Medical History: Positive for: Arrhythmia; Coronary Artery Disease; Hypertension; Myocardial Infarction Negative for: Angina; Congestive Heart Failure; Deep Vein Thrombosis; Hypotension; Peripheral Arterial  Disease; Peripheral Venous Disease; Phlebitis Psychiatric Complaints and Symptoms: Negative for: Anxiety; Claustrophobia Eyes Medical History: Positive for: Cataracts - removed Negative for: Glaucoma; Optic Neuritis Hematologic/Lymphatic Medical History: Positive for: Anemia Negative for: Hemophilia; Human Immunodeficiency Virus; Lymphedema; Sickle Cell Disease Endocrine STACI, DACK ([id number]) Medical History: Positive for: Type II Diabetes Negative for: Type I Diabetes Time with diabetes: no idea Treated with: Oral agents Blood sugar tested every day: No Genitourinary Medical History: Negative for: End Stage Renal Disease Immunological Medical History: Negative for: Lupus Erythematosus; Raynaudos; Scleroderma Integumentary (Skin) Medical History: Negative for: History of Burn; History of pressure wounds Musculoskeletal Medical History: Positive for: Osteoarthritis Negative for: Gout; Rheumatoid Arthritis; Osteomyelitis Neurologic Medical History: Negative for: Dementia; Neuropathy; Quadriplegia; Paraplegia; Seizure Disorder Oncologic Medical History: Negative for: Received Chemotherapy; Received Radiation HBO Extended History Items Eyes: Cataracts Immunizations Pneumococcal Vaccine: Received Pneumococcal Vaccination: Yes Implantable Devices No devices added Family and Social History Cancer: No; Diabetes: Yes - Father,Siblings,Child; Heart Disease: No; Hypertension: Yes - Child; Kidney Disease: No; Lung Disease: Yes - Child; Seizures: No; Stroke: Yes - Mother; Thyroid Problems: No; Former smoker; Marital Status - Single; Alcohol Use: Never; Drug Use: No History; Caffeine Use: Daily; Financial Concerns: No; Food, Clothing or Shelter Needs: No; Support System Lacking: No; Transportation Concerns: No Physician Affirmation I have reviewed and agree with the above information. FELMA, PFEFFERLE ([id number]) Electronic Signature(s) Signed: 10/30/2018 4:21:20  PM By: Harold Barban Signed: 10/31/2018 9:47:59 AM By: Worthy Keeler PA-C Entered By: Worthy Keeler on 10/29/2018 17:22:28 Joffe, [name] ([id number]) -------------------------------------------------------------------------------- SuperBill Details Patient Name: Chasen, Shelbey C. Date of Service: 10/29/2018 Medical Record Number: [id number] Patient Account Number: [id number] Date of Birth/Sex: 03/13/1928 (85 y.o. F) Treating RN: Harold Barban Primary Care Provider: Ria Bush Other Clinician: Referring Provider: Ria Bush Treating Provider/Extender: Melburn Hake, Apoorva [name] Weeks in Treatment: 31 Diagnosis Coding ICD-10 Codes Code Description I87.331 Chronic venous hypertension (idiopathic) with ulcer and inflammation of right lower extremity E11.622 Type 2 diabetes mellitus with other skin ulcer L97.812 Non-pressure chronic ulcer of other part of right lower leg with fat layer exposed L03.115 Cellulitis of right lower limb Facility Procedures CPT4 Code Description: 17510258 11042 - DEB SUBQ TISSUE 20 SQ CM/< ICD-10 Diagnosis Description L97.812 Non-pressure chronic ulcer of other part of right lower leg wi Modifier: th fat layer expo Quantity: 1 sed Physician Procedures CPT4 Code Description: 5277824 11042 - WC PHYS SUBQ TISS 20 SQ CM ICD-10 Diagnosis Description M35.361 Non-pressure chronic ulcer of other part of right lower leg wi Modifier: th fat layer expo Quantity: 1 sed Electronic Signature(s) Signed: 10/29/2018 5:23:55 PM By: Melburn Hake,  Kinzie Wickes PA-C Entered By: Worthy Keeler on 10/29/2018 17:23:55

## 2018-10-29 NOTE — Progress Notes (Addendum)
AKEELA, BUSK ([id number]) Visit Report for 10/29/2018 Arrival Information Details Patient Name: [name], [name] Date of Service: 10/29/2018 3:00 PM Medical Record Number: [id number] Patient Account Number: [id number] Date of Birth/Sex: [birth year] (85 y.o. F) Treating RN: Montey Hora Primary Care Bryndon Cumbie: Ria Bush Other Clinician: Referring Dillyn Menna: Ria Bush Treating Alegandra Sommers/Extender: Melburn Hake, HOYT Weeks in Treatment: 57 Visit Information History Since Last Visit Added or deleted any medications: No Patient Arrived: Walker Any new allergies or adverse reactions: No Arrival Time: 15:03 Had a fall or experienced change in No Accompanied By: self activities of daily living that may affect Transfer Assistance: None risk of falls: Patient Identification Verified: Yes Signs or symptoms of abuse/neglect since last visito No Secondary Verification Process Completed: Yes Hospitalized since last visit: No Implantable device outside of the clinic excluding No cellular tissue based products placed in the center since last visit: Has Dressing in Place as Prescribed: Yes Has Compression in Place as Prescribed: Yes Pain Present Now: No Electronic Signature(s) Signed: 10/29/2018 4:17:28 PM By: Montey Hora Entered By: Montey Hora on 10/29/2018 15:03:23 Kuzniar, [name] ([id number]) -------------------------------------------------------------------------------- Encounter Discharge Information Details Patient Name: [name], [name] Date of Service: 10/29/2018 3:00 PM Medical Record Number: [id number] Patient Account Number: [id number] Date of Birth/Sex: [birth year] (85 y.o. F) Treating RN: Army Melia Primary Care Keyen Marban: Ria Bush Other Clinician: Referring Rayansh Herbst: Ria Bush Treating Codi Kertz/Extender: Melburn Hake, HOYT Weeks in Treatment: 31 Encounter Discharge Information Items Post Procedure Vitals Discharge Condition:  Stable Temperature (F): 98.5 Ambulatory Status: Walker Pulse (bpm): 89 Discharge Destination: Home Respiratory Rate (breaths/min): 16 Transportation: Private Auto Blood Pressure (mmHg): 121/77 Accompanied By: self Schedule Follow-up Appointment: Yes Clinical Summary of Care: Electronic Signature(s) Signed: 10/29/2018 4:04:23 PM By: Army Melia Entered By: Army Melia on 10/29/2018 15:32:05 Tyler, Ellington CMarland Kitchen ([id number]) -------------------------------------------------------------------------------- Lower Extremity Assessment Details Patient Name: [name], [name] Date of Service: 10/29/2018 3:00 PM Medical Record Number: [id number] Patient Account Number: [id number] Date of Birth/Sex: 17-Mar-1928 (85 y.o. F) Treating RN: Montey Hora Primary Care Jasiri Hanawalt: Ria Bush Other Clinician: Referring Devorah Givhan: Ria Bush Treating Kenishia Plack/Extender: STONE III, HOYT Weeks in Treatment: 31 Edema Assessment Assessed: [Left: No] [Right: No] Edema: [Left: N] [Right: o] Vascular Assessment Pulses: Dorsalis Pedis Palpable: [Right:Yes] Electronic Signature(s) Signed: 10/29/2018 4:17:28 PM By: Montey Hora Entered By: Montey Hora on 10/29/2018 15:10:57 Kye, [name] ([id number]) -------------------------------------------------------------------------------- Multi Wound Chart Details Patient Name: [name], [name] Date of Service: 10/29/2018 3:00 PM Medical Record Number: [id number] Patient Account Number: [id number] Date of Birth/Sex: 1927-12-19 (85 y.o. F) Treating RN: Harold Barban Primary Care Charlottie Peragine: Ria Bush Other Clinician: Referring Kaede Clendenen: Ria Bush Treating Maral Lampe/Extender: STONE III, HOYT Weeks in Treatment: 31 Vital Signs Height(in): 58 Pulse(bpm): 89 Weight(lbs): 140 Blood Pressure(mmHg): 121/77 Body Mass Index(BMI): 29 Temperature(F): 98.5 Respiratory Rate 16 (breaths/min): Photos: [N/A:N/A] Wound Location:  Right Lower Leg - Lateral N/A N/A Wounding Event: Trauma N/A N/A Primary Etiology: Diabetic Wound/Ulcer of the N/A N/A Lower Extremity Comorbid History: Cataracts, Anemia, N/A N/A Arrhythmia, Coronary Artery Disease, Hypertension, Myocardial Infarction, Type II Diabetes, Osteoarthritis Date Acquired: 03/09/2018 N/A N/A Weeks of Treatment: 31 N/A N/A Wound Status: Open N/A N/A Measurements L x W x D 0.4x0.2x0.1 N/A N/A (cm) Area (cm) : 0.063 N/A N/A Volume (cm) : 0.006 N/A N/A % Reduction in Area: 96.70% N/A N/A % Reduction in Volume: 98.90% N/A N/A Classification: Grade 2 N/A N/A Exudate Amount: None Present N/A N/A Wound Margin: Flat and Intact N/A N/A Granulation Amount: None Present (0%) N/A  N/A Necrotic Amount: Large (67-100%) N/A N/A Necrotic Tissue: Eschar N/A N/A Exposed Structures: Fat Layer (Subcutaneous N/A N/A Tissue) Exposed: Yes Fascia: No Tendon: No Muscle: No [name], [name] ([id number]) Joint: No Bone: No Epithelialization: Medium (34-66%) N/A N/A Treatment Notes Electronic Signature(s) Signed: 10/30/2018 4:21:20 PM By: Harold Barban Entered By: Harold Barban on 10/29/2018 15:18:46 [name], [name] ([id number]) -------------------------------------------------------------------------------- [place] Details Patient Name: [name], [name] Date of Service: 10/29/2018 3:00 PM Medical Record Number: [id number] Patient Account Number: [id number] Date of Birth/Sex: 02/04/1928 (85 y.o. F) Treating RN: Harold Barban Primary Care Seif Teichert: Ria Bush Other Clinician: Referring Abbey Veith: Ria Bush Treating Isahia Hollerbach/Extender: Melburn Hake, HOYT Weeks in Treatment: 31 Active Inactive Abuse / Safety / Falls / Self Care Management Nursing Diagnoses: Potential for falls Goals: Patient will not experience any injury related to falls Date Initiated: 03/23/2018 Target Resolution Date: 06/09/2018 Goal Status:  Active Interventions: Assess fall risk on admission and as needed Notes: Orientation to the Wound Care Program Nursing Diagnoses: Knowledge deficit related to the wound healing center program Goals: Patient/caregiver will verbalize understanding of the [place] Program Date Initiated: 03/23/2018 Target Resolution Date: 06/09/2018 Goal Status: Active Interventions: Provide education on orientation to the wound center Notes: Pain, Acute or Chronic Nursing Diagnoses: Pain, acute or chronic: actual or potential Goals: Patient will verbalize adequate pain control and receive pain control interventions during procedures as needed Date Initiated: 03/23/2018 Target Resolution Date: 06/09/2018 Goal Status: Active Interventions: Assess comfort goal upon admission [name], [name] ([id number]) Notes: Wound/Skin Impairment Nursing Diagnoses: Impaired tissue integrity Goals: Ulcer/skin breakdown will heal within 14 weeks Date Initiated: 03/23/2018 Target Resolution Date: 06/09/2018 Goal Status: Active Interventions: Assess patient/caregiver ability to obtain necessary supplies Assess patient/caregiver ability to perform ulcer/skin care regimen upon admission and as needed Assess ulceration(s) every visit Notes: Electronic Signature(s) Signed: 10/30/2018 4:21:20 PM By: Harold Barban Entered By: Harold Barban on 10/29/2018 15:18:32 [name], [name] ([id number]) -------------------------------------------------------------------------------- Pain Assessment Details Patient Name: [name], [name] Date of Service: 10/29/2018 3:00 PM Medical Record Number: [id number] Patient Account Number: [id number] Date of Birth/Sex: Nov 03, 1927 (85 y.o. F) Treating RN: Montey Hora Primary Care Arryanna Holquin: Ria Bush Other Clinician: Referring Marqueze Ramcharan: Ria Bush Treating Charie Pinkus/Extender: Melburn Hake, HOYT Weeks in Treatment: 31 Active Problems Location of Pain Severity  and Description of Pain Patient Has [name] No Site Locations Pain Management and Medication Current Pain Management: Electronic Signature(s) Signed: 10/29/2018 4:17:28 PM By: Montey Hora Entered By: Montey Hora on 10/29/2018 15:03:31 Whittley, [name] ([id number]) -------------------------------------------------------------------------------- Patient/Caregiver Education Details Patient Name: Mapes, Lovina C. Date of Service: 10/29/2018 3:00 PM Medical Record Number: [id number] Patient Account Number: [id number] Date of Birth/Gender: 20-Feb-1928 (85 y.o. F) Treating RN: Harold Barban Primary Care Physician: Ria Bush Other Clinician: Referring Physician: Ria Bush Treating Physician/Extender: Sharalyn Ink in Treatment: 31 Education Assessment Education Provided To: Patient Education Topics Provided Wound/Skin Impairment: Handouts: Caring for Your Ulcer Methods: Demonstration, Explain/Verbal Responses: State content correctly Electronic Signature(s) Signed: 10/30/2018 4:21:20 PM By: Harold Barban Entered By: Harold Barban on 10/29/2018 15:19:20 [name], [name] ([id number]) -------------------------------------------------------------------------------- Wound Assessment Details Patient Name: [name], [name] Date of Service: 10/29/2018 3:00 PM Medical Record Number: [id number] Patient Account Number: [id number] Date of Birth/Sex: 20-Jun-1927 (85 y.o. F) Treating RN: Montey Hora Primary Care Kaitelyn Jamison: Ria Bush Other Clinician: Referring Jodeci Rini: Ria Bush Treating Shamara Soza/Extender: STONE III, HOYT Weeks in Treatment: 31 Wound Status Wound Number: 8 Primary Diabetic Wound/Ulcer of the Lower Extremity Etiology: Wound Location: Right Lower  Leg - Lateral Wound Open Wounding Event: Trauma Status: Date Acquired: 03/09/2018 Comorbid Cataracts, Anemia, Arrhythmia, Coronary Artery Weeks Of Treatment: 31 History: Disease,  Hypertension, Myocardial Infarction, Clustered Wound: No Type II Diabetes, Osteoarthritis Photos Wound Measurements Length: (cm) 0.4 % Reduction in Width: (cm) 0.2 % Reduction in Depth: (cm) 0.1 Epithelializat Area: (cm) 0.063 Tunneling: Volume: (cm) 0.006 Undermining: Area: 96.7% Volume: 98.9% ion: Medium (34-66%) No No Wound Description Classification: Grade 2 Foul Odor Afte Wound Margin: Flat and Intact Slough/Fibrino Exudate Amount: None Present r Cleansing: No Yes Wound Bed Granulation Amount: None Present (0%) Exposed Structure Necrotic Amount: Large (67-100%) Fascia Exposed: No Necrotic Quality: Eschar Fat Layer (Subcutaneous Tissue) Exposed: Yes Tendon Exposed: No Muscle Exposed: No Joint Exposed: No Bone Exposed: No Treatment Notes Wound #8 (Right, Lateral Lower Leg) [name], Aryella C. ([id number]) Notes xeroform, non adherent pad, conform and patient's own compression Electronic Signature(s) Signed: 10/29/2018 4:17:28 PM By: Montey Hora Entered By: Montey Hora on 10/29/2018 15:10:40 Rosselli, [name] ([id number]) -------------------------------------------------------------------------------- Vitals Details Patient Name: Nishiyama, Fransisca C. Date of Service: 10/29/2018 3:00 PM Medical Record Number: [id number] Patient Account Number: [id number] Date of Birth/Sex: 12/06/1927 (85 y.o. F) Treating RN: Montey Hora Primary Care Halbert Jesson: Ria Bush Other Clinician: Referring Jeralynn Vaquera: Ria Bush Treating Skye Plamondon/Extender: STONE III, HOYT Weeks in Treatment: 31 Vital Signs Time Taken: 15:04 Temperature (F): 98.5 Height (in): 58 Pulse (bpm): 89 Weight (lbs): 140 Respiratory Rate (breaths/min): 16 Body Mass Index (BMI): 29.3 Blood Pressure (mmHg): 121/77 Reference Range: 80 - 120 mg / dl Electronic Signature(s) Signed: 10/29/2018 4:17:28 PM By: Montey Hora Entered By: Montey Hora on 10/29/2018 15:06:15

## 2018-10-30 DIAGNOSIS — C44311 Basal cell carcinoma of skin of nose: Secondary | ICD-10-CM | POA: Diagnosis not present

## 2018-10-30 DIAGNOSIS — C4491 Basal cell carcinoma of skin, unspecified: Secondary | ICD-10-CM

## 2018-10-30 HISTORY — DX: Basal cell carcinoma of skin, unspecified: C44.91

## 2018-10-31 DIAGNOSIS — L97812 Non-pressure chronic ulcer of other part of right lower leg with fat layer exposed: Secondary | ICD-10-CM | POA: Diagnosis not present

## 2018-10-31 DIAGNOSIS — E119 Type 2 diabetes mellitus without complications: Secondary | ICD-10-CM | POA: Diagnosis not present

## 2018-10-31 DIAGNOSIS — I2510 Atherosclerotic heart disease of native coronary artery without angina pectoris: Secondary | ICD-10-CM | POA: Diagnosis not present

## 2018-10-31 DIAGNOSIS — L03115 Cellulitis of right lower limb: Secondary | ICD-10-CM | POA: Diagnosis not present

## 2018-10-31 DIAGNOSIS — I87331 Chronic venous hypertension (idiopathic) with ulcer and inflammation of right lower extremity: Secondary | ICD-10-CM | POA: Diagnosis not present

## 2018-10-31 DIAGNOSIS — I10 Essential (primary) hypertension: Secondary | ICD-10-CM | POA: Diagnosis not present

## 2018-11-02 DIAGNOSIS — I87331 Chronic venous hypertension (idiopathic) with ulcer and inflammation of right lower extremity: Secondary | ICD-10-CM | POA: Diagnosis not present

## 2018-11-02 DIAGNOSIS — E119 Type 2 diabetes mellitus without complications: Secondary | ICD-10-CM | POA: Diagnosis not present

## 2018-11-02 DIAGNOSIS — L97812 Non-pressure chronic ulcer of other part of right lower leg with fat layer exposed: Secondary | ICD-10-CM | POA: Diagnosis not present

## 2018-11-02 DIAGNOSIS — L03115 Cellulitis of right lower limb: Secondary | ICD-10-CM | POA: Diagnosis not present

## 2018-11-02 DIAGNOSIS — I10 Essential (primary) hypertension: Secondary | ICD-10-CM | POA: Diagnosis not present

## 2018-11-02 DIAGNOSIS — I2510 Atherosclerotic heart disease of native coronary artery without angina pectoris: Secondary | ICD-10-CM | POA: Diagnosis not present

## 2018-11-02 NOTE — Progress Notes (Signed)
BREKYN, HUNTOON ([id number]) Visit Report for 10/22/2018 Chief Complaint Document Details Patient Name: [name], [name] Date of Service: 10/22/2018 3:15 PM Medical Record Number: [id number] Patient Account Number: [id number] Date of Birth/Sex: 08/19/1927 (85 y.o. F) Treating RN: Cornell Barman Primary Care Provider: Ria Bush Other Clinician: Referring Provider: Ria Bush Treating Provider/Extender: Melburn Hake, Jalyn Rosero Weeks in Treatment: 30 Information Obtained from: Patient Chief Complaint RLE wounds Electronic Signature(s) Signed: 10/26/2018 6:08:37 PM By: Worthy Keeler PA-C Entered By: Worthy Keeler on 10/22/2018 15:34:28 Wherley, [name] ([id number]) -------------------------------------------------------------------------------- Debridement Details Patient Name: [name], [name] Date of Service: 10/22/2018 3:15 PM Medical Record Number: [id number] Patient Account Number: [id number] Date of Birth/Sex: 03/10/1928 (85 y.o. F) Treating RN: Montey Hora Primary Care Provider: Ria Bush Other Clinician: Referring Provider: Ria Bush Treating Provider/Extender: Melburn Hake, Analia Zuk Weeks in Treatment: 30 Debridement Performed for Wound #8 Right,Lateral Lower Leg Assessment: Performed By: Physician STONE III, Hermelinda Diegel E., PA-C Debridement Type: Debridement Severity of Tissue Pre Fat layer exposed Debridement: Level of Consciousness (Pre- Awake and Alert procedure): Pre-procedure Verification/Time Yes - 15:37 Out Taken: Start Time: 15:37 Pain Control: Lidocaine 4% Topical Solution Total Area Debrided (L x W): 0.6 (cm) x 0.3 (cm) = 0.18 (cm) Tissue and other material Non-Viable, Fibrin/Exudate debrided: Level: Non-Viable Tissue Debridement Description: Selective/Open Wound Instrument: Curette Bleeding: None End Time: 15:40 Procedural Pain: 0 Post Procedural Pain: 0 Response to Treatment: Procedure was tolerated well Level of  Consciousness Awake and Alert (Post-procedure): Post Debridement Measurements of Total Wound Length: (cm) 0.4 Width: (cm) 0.1 Depth: (cm) 0.1 Volume: (cm) 0.003 Character of Wound/Ulcer Post Debridement: Improved Severity of Tissue Post Debridement: Fat layer exposed Post Procedure Diagnosis Same as Pre-procedure Electronic Signature(s) Signed: 10/22/2018 4:17:58 PM By: Montey Hora Signed: 10/26/2018 6:08:37 PM By: Worthy Keeler PA-C Entered By: Montey Hora on 10/22/2018 15:40:45 Mower, Messina C. ([id number]) -------------------------------------------------------------------------------- HPI Details Patient Name: [name], [name] Date of Service: 10/22/2018 3:15 PM Medical Record Number: [id number] Patient Account Number: [id number] Date of Birth/Sex: 08-Nov-1927 (85 y.o. F) Treating RN: Cornell Barman Primary Care Provider: Ria Bush Other Clinician: Referring Provider: Ria Bush Treating Provider/Extender: Melburn Hake, Corin Tilly Weeks in Treatment: 30 History of Present Illness HPI Description: Readmission: 03/23/18 on evaluation today patient presents for readmission entire clinic concerning issues that she has been having for the past couple of weeks in regard to her right lower extremity. The medial injury actually occurred when her dog tried to take a blanket from her and subsequently scratched her with his toenail. Subsequently the lateral injury was a wound where she dropped a knife in her kitchen and hit the side of her leg. With that being said both have been open for some time and home health nurse that has been coming out marked for her where the redness was several days ago. Nonetheless this has spread will be on the margin of what was marked. She was placed on Keflex which was started two days ago but at this point it does not seem to have helped with any improvement in the overall appearance of the wounds. No fevers, chills, nausea, or vomiting noted at  this time. 04/02/18 Seen today for follow up and management of right lower extremity wounds. Right lateral wound with adherent slough. She is complaining of pain to touch which limited exam. Recently treated in the ED for cellulitis and wound culture positive for MRSA. Completed a dose of Vancomycin and transitioned to doxycycline by mouth. Erythema remains present with improvement  since last visit. Right medial wound with scab present. No fevers, chills, nausea, or vomiting noted at this time. 04/10/18 on evaluation today patient appears to be doing a Nesby better in regard to her right lateral lower [name]. The slough does seem to be coming off although it is slow from the wound bed. Fortunately there does not appear to be any evidence of infection at this time which is good news. With that being said I do believe that she is going to require longer treatment with the Santyl simply due to the fact that I'm not able to really debride the wound due to the pain that she is experiencing. 04/16/18 upon evaluation today patient's wound bed currently on the lateral lower extremity on the right appears to be doing a Seoane better at this point. We are utilizing sensible currently. Overall I'm very pleased with how things are progressing although there still is some work to do before this will be completely healed. No fevers, chills, nausea, or vomiting noted at this time. 04/23/18 on evaluation today patient appears to be doing about the same at this point maybe just a Hubbard bit smaller than last week's evaluation. She continues to have some slough buildup on the surface of the wound and has very Edwards granulation unfortunately. This seems to be very slow moving I'm getting somewhat concerned about the possibility of her having decreased blood flow which is leading to her subsequently not being able to heal this area as quickly as she shut otherwise. She is currently utilizing Santyl. 05/01/18 on  evaluation today patient appears to be doing somewhat poorly in regard to her lower extremity ulcer. At this point she's been tolerating the dressing changes to some degree without complication. With that being said she does note of the past several days that she has been having more pain unfortunately. On inspection it appears that she may have signs of infection which would account for the pain. 05/07/18 on evaluation today patient's wound actually appears to be doing better she's not having as much erythema in the right lower extremity as compared to last week. Fortunately there's no signs of infection this is good news as well. Overall very pleased with the progress that she has made up to this point. 05/14/18 on evaluation today patient actually appears to be doing rather well in regard to her right lower extremity ulcer. She does not have erythema that was characterizing the cellulitis prior to initiation of antibiotic therapy. With that being said her wound still appears to be somewhat slow in healing I believe this may be arterial in nature that is my concern at least. With that being said I do want her to see the vascular specialist as soon as possible we been trying to get this set up I'm not sure if she's not answering the phone or what exactly is going on here. MARYEM, SHUFFLER ([id number]) 05/21/18 on evaluation today patient's wound actually appears to be doing a Adsit better possibly some additional granulation compared to my last evaluation. With that being said she still continues to have a lot of pain especially when her legs are elevated such as sitting in the chair here in the office. There does not appear to be any signs of infection which is good news. 05/28/18 on evaluation today patient appears to be doing a Bisceglia better in regard to her right lateral lower [name]. Again she was supposed to have an appointment with vascular unfortunately they attempted to  call her three  times and unfortunately were unable to get anything scheduled. That reason closed up for him. She tells me that if the phone is answered and she doesn't recognize what is that she will hang up as well those that she lives with. Obviously she's not gonna recognize somebody calling from the office that she has not previously spoken with. Nonetheless the patient states that she does not remember any fall. I think if we're gonna get her in with vascular specialist we're gonna have to try to make the appointment for her. 06/04/2018 Seen today for follow up and management of right lateral lower extremity wound. Adherent slough present over surface of wound. Scheduled to follow up for ABI/TBI on 06/06/2018 with vein and vascular specialist. She is hard of hearing. Wound is sensitive to touch. No signs or symptoms of infection. Denies any recent fevers, chills, or SOB. 06/11/18 on evaluation today patient actually appears to be doing very well in regard to her right lateral lower extremity ulcer. She's been tolerating the dressing changes without complication. Fortunately there's no evidence of infection. With that being said she did have her arterial studies which revealed that she had a very low ABI which was 0.67 on the right with a TBI of 0.32 and 0.65 on the left with a TBI of 0.16. I do believe she needs to see vascular and we are working on that referral as well. 06/21/18 on evaluation today patient's wound actually appears to be doing okay she does have an appointment with vascular coming up on the 24th. Nonetheless she's been having some issues that are more urinary in nature right now that are causing trouble for her. Fortunately there's no signs of severe sepsis although she is running a low-grade temperature 99.9 on evaluation today. She also is having some issues right now with chills as well as urinary urgency and frequency. Again I feel like the city is more urinary and symptomology I do not  believe she has anything upper respiratory which is good news. Nonetheless I think this may need to be checked out more quickly especially in light of the fact that she's having significant back pain at this point. 07/02/18 on evaluation today patient appears to be doing okay in regard to her ulcer on the right lower extremity. She's been tolerating the dressing changes without complication. Fortunately there does not appear to be any signs of infection which is good news. No fevers, chills, nausea, or vomiting noted at this time. 07/10/18 on evaluation today patient appears to be doing well in regard to her right lateral lower extremity ulcer all things considering. She's been using this form this seems to be the one thing that she can really do on her own and she seems to be making some progress obviously help with this matter. I think the biggest issue limiting her feeling speed is her blood flow she canceled her original appointment with vascular her next one is not till May. She was worried about the Covid-19 Virus and having to see another physician. Nonetheless there's no signs of active infection at this time. 07/16/18 on evaluation today patient appears to be doing okay in regard to her right lateral lower extremity ulcer. She's been tolerating the dressing changes without complication. Fortunately there's no signs of active infection at this time. No fevers, chills, nausea, or vomiting noted at this time. 07/23/18 on evaluation today patient appears to be doing very well in regard to her right lateral load should  be ulcer all things considering. She still again is not seeing vascular until mid May in the meantime she's been utilizing the over-the-counter tripling about appointment which seems to be the only thing she's able to do routinely on her own without getting confused or frustrated. Overall it seems to be doing okay fortunately she's showing no signs of infection. 07/30/18 upon  evaluation today patient actually appears to be doing about the same in regard to her wound in general. She is making very slow progress most likely again due to the issue with her blood flow she sees vascular towards the middle of news. In the meantime we've really been trying to just keep things in the control from the infection standpoint and allow for any healing that could take place up until appointment she can see vascular. 08/06/18 upon evaluation today patient's wound actually appears to be doing about the same although the wound bed may show Durante bit more granulation which is good news. Fortunately there's no signs of active infection at this time. No fevers, chills, nausea, or vomiting noted at this time. 08/14/18 on evaluation today patient actually appears to be doing about the same in regard to her lower extremity ulcer. Saxe, Apryl C. ([id number]) Fortunately there's no signs of active infection at this time which is good news. Fortunately the patient has been tolerating the dressing changes without complication. No fevers, chills, nausea, or vomiting noted at this time. She does have her appointment with vascular next week. 08/20/18 on evaluation today patient actually appears to be doing a Burston better in my opinion with regard to her lower extremity ulcer. She's been tolerating the dressing changes without complication. Fortunately there's no signs of active infection at this time which is good news. She actually will be seen vascular tomorrow. 08/30/18 on evaluation today patient's wound actually appears to be showing some signs of improvement which is good news. Fortunately there's no signs of infection. She does the the vascular doctor on June 4. 09/10/18 on evaluation today patient is status post having had an angiogram with Dr. Lucky Cowboy. Unfortunately she tells me that she still having a lot of discomfort although the wound bed itself seems to be doing better. She does have more  swelling of the right lower extremity but nothing too significant. I think this is causing Pless bit of increased pain right now as well that is unfortunate. No fevers, chills, nausea, or vomiting noted at this time. 09/17/18 on evaluation today patient actually appears to be doing much better today compared to last week's evaluation. I'm very pleased in this regard. Fortunately there's no signs of active infection which is also good news. 09/24/18 on evaluation today patient appears to be doing well in regard to the wounds on the right lateral lower extremity. Since her vascular intervention I do feel like she's made excellent progress. Fortunately there's no signs of active infection at this time. No fevers, chills, nausea, or vomiting noted at this time. 10/01/18 on evaluation today patient actually appears to be doing well in regard to right lateral lower Trinity ulcers. I think it's the right time at this point to go ahead and switch to [facility] as the dressing of choice. We've been utilizing the alginate and that has been beneficial but she's not noticing as much drainage in fact it's actually staying more dry at this point. We're gonna see how things do with the dressing change I'll see her back next week I'll be seeing that regard in the  meantime she continues to be depressed we did talk about the possibility of seeing a psychiatrist actually has one who she sees and has seen for quite a while. She actually sees her later this month. 10/08/18 on evaluation today patient's wound bed actually showed signs of improvement really at both locations. She has been getting better week by week and her pain is also not nearly as bad as what it was in the past. Overall I think that the vascular intervention has been extremely beneficial for her. 10/15/18-Patient returns at 1 week, she has currently 2 small wounds on the right leg both of which appear to be improving but continuing silver collagen to this  area 10/22/18 on evaluation today patient actually appears to be doing better with regard to her right lower extremity ulcers. Both are measuring smaller compared to when I last saw her. Fortunately she is not having as much pain as she has in the past in fact she did not even really talk about the pain today which is actually the first time that that has occurred. Normally the entire conversation is centered around how much he hurts. Overall she seemed to be in Stancil better spirits in my pinion. Electronic Signature(s) Signed: 10/26/2018 6:08:37 PM By: Worthy Keeler PA-C Entered By: Worthy Keeler on 10/26/2018 18:06:46 Lawler, [name] ([id number]) -------------------------------------------------------------------------------- Physical Exam Details Patient Name: Hargett, Sarafina C. Date of Service: 10/22/2018 3:15 PM Medical Record Number: [id number] Patient Account Number: [id number] Date of Birth/Sex: 1927-08-18 (85 y.o. F) Treating RN: Cornell Barman Primary Care Provider: Ria Bush Other Clinician: Referring Provider: Ria Bush Treating Provider/Extender: STONE III, Nakira Litzau Weeks in Treatment: 60 Constitutional Well-nourished and well-hydrated in no acute distress. Respiratory normal breathing without difficulty. Psychiatric this patient is able to make decisions and demonstrates good insight into disease process. Alert and Oriented x 3. pleasant and cooperative. Notes Patient's wound bed currently showed signs of some dried dressing and drainage covering the surface of the wound which made it difficult to tell exactly what was open or not. For that reason I did actually go ahead and remove this necrotic debris from the surface of the wound which revealed that both wound beds actually appear to be doing quite well which is excellent news. She had minimal discomfort with this. Electronic Signature(s) Signed: 10/26/2018 6:08:37 PM By: Worthy Keeler PA-C Entered By:  Worthy Keeler on 10/26/2018 18:07:38 Minella, [name] ([id number]) -------------------------------------------------------------------------------- Physician Orders Details Patient Name: Guereca, Shamonique C. Date of Service: 10/22/2018 3:15 PM Medical Record Number: [id number] Patient Account Number: [id number] Date of Birth/Sex: 05-24-1927 (85 y.o. F) Treating RN: Montey Hora Primary Care Provider: Ria Bush Other Clinician: Referring Provider: Ria Bush Treating Provider/Extender: Melburn Hake, Marisol Giambra Weeks in Treatment: 30 Verbal / Phone Orders: No Diagnosis Coding ICD-10 Coding Code Description I87.331 Chronic venous hypertension (idiopathic) with ulcer and inflammation of right lower extremity E11.622 Type 2 diabetes mellitus with other skin ulcer L97.812 Non-pressure chronic ulcer of other part of right lower leg with fat layer exposed L03.115 Cellulitis of right lower limb Wound Cleansing Wound #8 Right,Lateral Lower Leg o Clean wound with Normal Saline. Anesthetic (add to Medication List) Wound #8 Right,Lateral Lower Leg o Topical Lidocaine 4% cream applied to wound bed prior to debridement (In Clinic Only). Primary Wound Dressing Wound #8 Right,Lateral Lower Leg o Xeroform Secondary Dressing Wound #8 Right,Lateral Lower Leg o Conform/Kerlix o Other - Stretchy to secure Dressing Change Frequency Wound #8 Right,Lateral Lower Leg o Change Dressing  Monday, Wednesday, Friday Follow-up Appointments Wound #8 Right,Lateral Lower Leg o Return Appointment in 1 week. Edema Control Wound #8 Right,Lateral Lower Leg o Elevate legs to the level of the heart and pump ankles as often as possible Home Health Wound #8 Right,Lateral Lower Leg o [place] Nurse may visit PRN to address patientos wound care needs. LOYOLA, SANTINO ([id number]) o FACE TO FACE ENCOUNTER: MEDICARE and MEDICAID PATIENTS: I certify that  this patient is under my care and that I had a face-to-face encounter that meets the physician face-to-face encounter requirements with this patient on this date. The encounter with the patient was in whole or in part for the following MEDICAL CONDITION: (primary reason for [place]) MEDICAL NECESSITY: I certify, that based on my findings, NURSING services are a medically necessary home health service. HOME BOUND STATUS: I certify that my clinical findings support that this patient is homebound (i.e., Due to illness or injury, pt requires aid of supportive devices such as crutches, cane, wheelchairs, walkers, the use of special transportation or the assistance of another person to leave their place of residence. There is a normal inability to leave the home and doing so requires considerable and taxing effort. Other absences are for medical reasons / religious services and are infrequent or of short duration when for other reasons). o If current dressing causes regression in wound condition, may D/C ordered dressing product/s and apply Normal Saline Moist Dressing daily until next [place] / Other MD appointment. [place] of regression in wound condition at [phone]. o Please direct any NON-WOUND related issues/requests for orders to patient's Primary Care Physician Electronic Signature(s) Signed: 10/22/2018 4:17:58 PM By: Montey Hora Signed: 10/26/2018 6:08:37 PM By: Worthy Keeler PA-C Entered By: Montey Hora on 10/22/2018 15:43:53 Welty, [name] ([id number]) -------------------------------------------------------------------------------- Problem List Details Patient Name: Fuelling, Crissy C. Date of Service: 10/22/2018 3:15 PM Medical Record Number: [id number] Patient Account Number: [id number] Date of Birth/Sex: 1927/09/14 (85 y.o. F) Treating RN: Cornell Barman Primary Care Provider: Ria Bush Other Clinician: Referring Provider:  Ria Bush Treating Provider/Extender: Melburn Hake, Tanav Orsak Weeks in Treatment: 30 Active Problems ICD-10 Evaluated Encounter Code Description Active Date Today Diagnosis I87.331 Chronic venous hypertension (idiopathic) with ulcer and 03/23/2018 No Yes inflammation of right lower extremity E11.622 Type 2 diabetes mellitus with other skin ulcer 03/23/2018 No Yes L97.812 Non-pressure chronic ulcer of other part of right lower leg 03/23/2018 No Yes with fat layer exposed L03.115 Cellulitis of right lower limb 03/23/2018 No Yes Inactive Problems Resolved Problems Electronic Signature(s) Signed: 10/26/2018 6:08:37 PM By: Worthy Keeler PA-C Entered By: Worthy Keeler on 10/22/2018 15:34:05 Coffel, [name] ([id number]) -------------------------------------------------------------------------------- Progress Note Details Patient Name: Sult, Kerstin C. Date of Service: 10/22/2018 3:15 PM Medical Record Number: [id number] Patient Account Number: [id number] Date of Birth/Sex: 1927/07/07 (85 y.o. F) Treating RN: Cornell Barman Primary Care Provider: Ria Bush Other Clinician: Referring Provider: Ria Bush Treating Provider/Extender: Melburn Hake, Cambry Spampinato Weeks in Treatment: 30 Subjective Chief Complaint Information obtained from Patient RLE wounds History of Present Illness (HPI) Readmission: 03/23/18 on evaluation today patient presents for readmission entire clinic concerning issues that she has been having for the past couple of weeks in regard to her right lower extremity. The medial injury actually occurred when her dog tried to take a blanket from her and subsequently scratched her with his toenail. Subsequently the lateral injury was a wound where she dropped a knife in her  kitchen and hit the side of her leg. With that being said both have been open for some time and home health nurse that has been coming out marked for her where the redness was several days ago.  Nonetheless this has spread will be on the margin of what was marked. She was placed on Keflex which was started two days ago but at this point it does not seem to have helped with any improvement in the overall appearance of the wounds. No fevers, chills, nausea, or vomiting noted at this time. 04/02/18 Seen today for follow up and management of right lower extremity wounds. Right lateral wound with adherent slough. She is complaining of pain to touch which limited exam. Recently treated in the ED for cellulitis and wound culture positive for MRSA. Completed a dose of Vancomycin and transitioned to doxycycline by mouth. Erythema remains present with improvement since last visit. Right medial wound with scab present. No fevers, chills, nausea, or vomiting noted at this time. 04/10/18 on evaluation today patient appears to be doing a [place] better in regard to her right lateral lower [name]. The slough does seem to be coming off although it is slow from the wound bed. Fortunately there does not appear to be any evidence of infection at this time which is good news. With that being said I do believe that she is going to require longer treatment with the Santyl simply due to the fact that I'm not able to really debride the wound due to the pain that she is experiencing. 04/16/18 upon evaluation today patient's wound bed currently on the lateral lower extremity on the right appears to be doing a [place] better at this point. We are utilizing sensible currently. Overall I'm very pleased with how things are progressing although there still is some work to do before this will be completely healed. No fevers, chills, nausea, or vomiting noted at this time. 04/23/18 on evaluation today patient appears to be doing about the same at this point maybe just a Dauber bit smaller than last week's evaluation. She continues to have some slough buildup on the surface of the wound and has very Roeder  granulation unfortunately. This seems to be very slow moving I'm getting somewhat concerned about the possibility of her having decreased blood flow which is leading to her subsequently not being able to heal this area as quickly as she shut otherwise. She is currently utilizing Santyl. 05/01/18 on evaluation today patient appears to be doing somewhat poorly in regard to her lower extremity ulcer. At this point she's been tolerating the dressing changes to some degree without complication. With that being said she does note of the past several days that she has been having more pain unfortunately. On inspection it appears that she may have signs of infection which would account for the pain. 05/07/18 on evaluation today patient's wound actually appears to be doing better she's not having as much erythema in the right lower extremity as compared to last week. Fortunately there's no signs of infection this is good news as well. Overall very pleased with the progress that she has made up to this point. EMILEE, MARKET ([id number]) 05/14/18 on evaluation today patient actually appears to be doing rather well in regard to her right lower extremity ulcer. She does not have erythema that was characterizing the cellulitis prior to initiation of antibiotic therapy. With that being said her wound still appears to be somewhat slow in healing I believe this may  be arterial in nature that is my concern at least. With that being said I do want her to see the vascular specialist as soon as possible we been trying to get this set up I'm not sure if she's not answering the phone or what exactly is going on here. 05/21/18 on evaluation today patient's wound actually appears to be doing a Karis better possibly some additional granulation compared to my last evaluation. With that being said she still continues to have a lot of pain especially when her legs are elevated such as sitting in the chair here in the office.  There does not appear to be any signs of infection which is good news. 05/28/18 on evaluation today patient appears to be doing a Shankar better in regard to her right lateral lower [name]. Again she was supposed to have an appointment with vascular unfortunately they attempted to call her three times and unfortunately were unable to get anything scheduled. That reason closed up for him. She tells me that if the phone is answered and she doesn't recognize what is that she will hang up as well those that she lives with. Obviously she's not gonna recognize somebody calling from the office that she has not previously spoken with. Nonetheless the patient states that she does not remember any fall. I think if we're gonna get her in with vascular specialist we're gonna have to try to make the appointment for her. 06/04/2018 Seen today for follow up and management of right lateral lower extremity wound. Adherent slough present over surface of wound. Scheduled to follow up for ABI/TBI on 06/06/2018 with vein and vascular specialist. She is hard of hearing. Wound is sensitive to touch. No signs or symptoms of infection. Denies any recent fevers, chills, or SOB. 06/11/18 on evaluation today patient actually appears to be doing very well in regard to her right lateral lower extremity ulcer. She's been tolerating the dressing changes without complication. Fortunately there's no evidence of infection. With that being said she did have her arterial studies which revealed that she had a very low ABI which was 0.67 on the right with a TBI of 0.32 and 0.65 on the left with a TBI of 0.16. I do believe she needs to see vascular and we are working on that referral as well. 06/21/18 on evaluation today patient's wound actually appears to be doing okay she does have an appointment with vascular coming up on the 24th. Nonetheless she's been having some issues that are more urinary in nature right now that are  causing trouble for her. Fortunately there's no signs of severe sepsis although she is running a low-grade temperature 99.9 on evaluation today. She also is having some issues right now with chills as well as urinary urgency and frequency. Again I feel like the city is more urinary and symptomology I do not believe she has anything upper respiratory which is good news. Nonetheless I think this may need to be checked out more quickly especially in light of the fact that she's having significant back pain at this point. 07/02/18 on evaluation today patient appears to be doing okay in regard to her ulcer on the right lower extremity. She's been tolerating the dressing changes without complication. Fortunately there does not appear to be any signs of infection which is good news. No fevers, chills, nausea, or vomiting noted at this time. 07/10/18 on evaluation today patient appears to be doing well in regard to her right lateral lower extremity ulcer  all things considering. She's been using this form this seems to be the one thing that she can really do on her own and she seems to be making some progress obviously help with this matter. I think the biggest issue limiting her feeling speed is her blood flow she canceled her original appointment with vascular her next one is not till May. She was worried about the Covid-19 Virus and having to see another physician. Nonetheless there's no signs of active infection at this time. 07/16/18 on evaluation today patient appears to be doing okay in regard to her right lateral lower extremity ulcer. She's been tolerating the dressing changes without complication. Fortunately there's no signs of active infection at this time. No fevers, chills, nausea, or vomiting noted at this time. 07/23/18 on evaluation today patient appears to be doing very well in regard to her right lateral load should be ulcer all things considering. She still again is not seeing vascular until  mid May in the meantime she's been utilizing the over-the-counter tripling about appointment which seems to be the only thing she's able to do routinely on her own without getting confused or frustrated. Overall it seems to be doing okay fortunately she's showing no signs of infection. 07/30/18 upon evaluation today patient actually appears to be doing about the same in regard to her wound in general. She is making very slow progress most likely again due to the issue with her blood flow she sees vascular towards the middle of news. In the meantime we've really been trying to just keep things in the control from the infection standpoint and allow for any healing that could take place up until appointment she can see vascular. NEETI, KNUDTSON ([id number]) 08/06/18 upon evaluation today patient's wound actually appears to be doing about the same although the wound bed may show Kronick bit more granulation which is good news. Fortunately there's no signs of active infection at this time. No fevers, chills, nausea, or vomiting noted at this time. 08/14/18 on evaluation today patient actually appears to be doing about the same in regard to her lower extremity ulcer. Fortunately there's no signs of active infection at this time which is good news. Fortunately the patient has been tolerating the dressing changes without complication. No fevers, chills, nausea, or vomiting noted at this time. She does have her appointment with vascular next week. 08/20/18 on evaluation today patient actually appears to be doing a Kearl better in my opinion with regard to her lower extremity ulcer. She's been tolerating the dressing changes without complication. Fortunately there's no signs of active infection at this time which is good news. She actually will be seen vascular tomorrow. 08/30/18 on evaluation today patient's wound actually appears to be showing some signs of improvement which is good news. Fortunately there's  no signs of infection. She does the the vascular doctor on June 4. 09/10/18 on evaluation today patient is status post having had an angiogram with Dr. Lucky Cowboy. Unfortunately she tells me that she still having a lot of discomfort although the wound bed itself seems to be doing better. She does have more swelling of the right lower extremity but nothing too significant. I think this is causing [place]uma bit of increased pain right now as well that is unfortunate. No fevers, chills, nausea, or vomiting noted at this time. 09/17/18 on evaluation today patient actually appears to be doing much better today compared to last week's evaluation. I'm very pleased in this regard. Fortunately there's no  signs of active infection which is also good news. 09/24/18 on evaluation today patient appears to be doing well in regard to the wounds on the right lateral lower extremity. Since her vascular intervention I do feel like she's made excellent progress. Fortunately there's no signs of active infection at this time. No fevers, chills, nausea, or vomiting noted at this time. 10/01/18 on evaluation today patient actually appears to be doing well in regard to right lateral lower Trinity ulcers. I think it's the right time at this point to go ahead and switch to [facility] as the dressing of choice. We've been utilizing the alginate and that has been beneficial but she's not noticing as much drainage in fact it's actually staying more dry at this point. We're gonna see how things do with the dressing change I'll see her back next week I'll be seeing that regard in the meantime she continues to be depressed we did talk about the possibility of seeing a psychiatrist actually has one who she sees and has seen for quite a while. She actually sees her later this month. 10/08/18 on evaluation today patient's wound bed actually showed signs of improvement really at both locations. She has been getting better week by week and her pain is also  not nearly as bad as what it was in the past. Overall I think that the vascular intervention has been extremely beneficial for her. 10/15/18-Patient returns at 1 week, she has currently 2 small wounds on the right leg both of which appear to be improving but continuing silver collagen to this area 10/22/18 on evaluation today patient actually appears to be doing better with regard to her right lower extremity ulcers. Both are measuring smaller compared to when I last saw her. Fortunately she is not having as much pain as she has in the past in fact she did not even really talk about the pain today which is actually the first time that that has occurred. Normally the entire conversation is centered around how much he hurts. Overall she seemed to be in Sch[place]ani better spirits in my pinion. Patient History Information obtained from Patient. Family History Diabetes - Father,Siblings,Child, Hypertension - Child, Lung Disease - Child, Stroke - Mother, No family history of Cancer, Heart Disease, Kidney Disease, Seizures, Thyroid Problems. Social History Former smoker, Marital Status - Single, Alcohol Use - Never, Drug Use - No History, Caffeine Use - Daily. Medical History CHRYSTIAN, RESSLER ([id number]) Eyes Patient has history of Cataracts - removed Denies history of Glaucoma, Optic Neuritis Hematologic/Lymphatic Patient has history of Anemia Denies history of Hemophilia, Human Immunodeficiency Virus, Lymphedema, Sickle Cell Disease Respiratory Denies history of Aspiration, Asthma, Chronic Obstructive Pulmonary Disease (COPD), Pneumothorax, Sleep Apnea, Tuberculosis Cardiovascular Patient has history of Arrhythmia, Coronary Artery Disease, Hypertension, Myocardial Infarction Denies history of Angina, Congestive Heart Failure, Deep Vein Thrombosis, Hypotension, Peripheral Arterial Disease, Peripheral Venous Disease, Phlebitis Endocrine Patient has history of Type II Diabetes Denies history of  Type I Diabetes Genitourinary Denies history of End Stage Renal Disease Immunological Denies history of Lupus Erythematosus, Raynaud s, Scleroderma Integumentary (Skin) Denies history of History of Burn, History of pressure wounds Musculoskeletal Patient has history of Osteoarthritis Denies history of Gout, Rheumatoid Arthritis, Osteomyelitis Neurologic Denies history of Dementia, Neuropathy, Quadriplegia, Paraplegia, Seizure Disorder Oncologic Denies history of Received Chemotherapy, Received Radiation Medical And Surgical History Notes Constitutional Symptoms (General Health) High Blood Pressure, open heart surgery, gall stone, hysterectomy; Fluid pill, Anxiety Review of Systems (ROS) Constitutional Symptoms (General Health)  Denies complaints or symptoms of Fatigue, Fever, Chills, Marked Weight Change. Respiratory Denies complaints or symptoms of Chronic or frequent coughs, Shortness of Breath. Cardiovascular Complains or has symptoms of LE edema. Denies complaints or symptoms of Chest pain. Psychiatric Denies complaints or symptoms of Anxiety, Claustrophobia. Objective Constitutional Well-nourished and well-hydrated in no acute distress. Vitals Time Taken: 3:22 PM, Height: 58 in, Weight: 140 lbs, BMI: 29.3, Temperature: 98.1 F, Pulse: 73 bpm, Respiratory Casco, Mathew C. ([id number]) Rate: 16 breaths/min, Blood Pressure: 139/73 mmHg. Respiratory normal breathing without difficulty. Psychiatric this patient is able to make decisions and demonstrates good insight into disease process. Alert and Oriented x 3. pleasant and cooperative. General Notes: Patient's wound bed currently showed signs of some dried dressing and drainage covering the surface of the wound which made it difficult to tell exactly what was open or not. For that reason I did actually go ahead and remove this necrotic debris from the surface of the wound which revealed that both wound beds actually appear  to be doing quite well which is excellent news. She had minimal discomfort with this. Integumentary (Hair, Skin) Wound #8 status is Open. Original cause of wound was Trauma. The wound is located on the Right,Lateral Lower Leg. The wound measures 0.6cm length x 0.3cm width x 0.1cm depth; 0.141cm^2 area and 0.014cm^3 volume. There is Fat Layer (Subcutaneous Tissue) Exposed exposed. There is no tunneling or undermining noted. There is a none present amount of drainage noted. The wound margin is flat and intact. There is no granulation within the wound bed. There is a large (67-100%) amount of necrotic tissue within the wound bed including Eschar and Adherent Slough. Assessment Active Problems ICD-10 Chronic venous hypertension (idiopathic) with ulcer and inflammation of right lower extremity Type 2 diabetes mellitus with other skin ulcer Non-pressure chronic ulcer of other part of right lower leg with fat layer exposed Cellulitis of right lower limb Procedures Wound #8 Pre-procedure diagnosis of Wound #8 is a Diabetic Wound/Ulcer of the Lower Extremity located on the Right,Lateral Lower Leg .Severity of Tissue Pre Debridement is: Fat layer exposed. There was a Selective/Open Wound Non-Viable Tissue Debridement with a total area of 0.18 sq cm performed by STONE III, Mckenzie Toruno E., PA-C. With the following instrument(s): Curette to remove Non-Viable tissue/material. Material removed includes Fibrin/Exudate after achieving pain control using Lidocaine 4% Topical Solution. No specimens were taken. A time out was conducted at 15:37, prior to the start of the procedure. There was no bleeding. The procedure was tolerated well with a pain level of 0 throughout and a pain level of 0 following the procedure. Post Debridement Measurements: 0.4cm length x 0.1cm width x 0.1cm depth; 0.003cm^3 volume. Character of Wound/Ulcer Post Debridement is improved. Severity of Tissue Post Debridement is: Fat layer  exposed. Post procedure Diagnosis Wound #8: Same as Pre-Procedure Stanzione, Jailyn C. ([id number]) Plan Wound Cleansing: Wound #8 Right,Lateral Lower Leg: Clean wound with Normal Saline. Anesthetic (add to Medication List): Wound #8 Right,Lateral Lower Leg: Topical Lidocaine 4% cream applied to wound bed prior to debridement (In Clinic Only). Primary Wound Dressing: Wound #8 Right,Lateral Lower Leg: Xeroform Secondary Dressing: Wound #8 Right,Lateral Lower Leg: Conform/Kerlix Other - Stretchy to secure Dressing Change Frequency: Wound #8 Right,Lateral Lower Leg: Change Dressing Monday, Wednesday, Friday Follow-up Appointments: Wound #8 Right,Lateral Lower Leg: Return Appointment in 1 week. Edema Control: Wound #8 Right,Lateral Lower Leg: Elevate legs to the level of the heart and pump ankles as often as possible Home Health: Wound #8  Right,Lateral Lower Leg: [place] Nurse may visit PRN to address patient s wound care needs. FACE TO FACE ENCOUNTER: MEDICARE and MEDICAID PATIENTS: I certify that this patient is under my care and that I had a face-to-face encounter that meets the physician face-to-face encounter requirements with this patient on this date. The encounter with the patient was in whole or in part for the following MEDICAL CONDITION: (primary reason for [place]) MEDICAL NECESSITY: I certify, that based on my findings, NURSING services are a medically necessary home health service. HOME BOUND STATUS: I certify that my clinical findings support that this patient is homebound (i.e., Due to illness or injury, pt requires aid of supportive devices such as crutches, cane, wheelchairs, walkers, the use of special transportation or the assistance of another person to leave their place of residence. There is a normal inability to leave the home and doing so requires considerable and taxing effort. Other absences are for medical reasons /  religious services and are infrequent or of short duration when for other reasons). If current dressing causes regression in wound condition, may D/C ordered dressing product/s and apply Normal Saline Moist Dressing daily until next [place] / Other MD appointment. [place] of regression in wound condition at [phone]. Please direct any NON-WOUND related issues/requests for orders to patient's Primary Care Physician My suggestion at this point is gonna be that we actually switch to just using the Xeroform for the next week since this is less likely to dry out and stick to the surface of the wound. The patient will follow-up in one weeks time at which point will see were things stand. Please see above for specific wound care orders. We will see patient for re-evaluation in 1 week(s) here in the clinic. If anything worsens or changes patient will contact our office for additional recommendations. Electronic Signature(s) Signed: 10/26/2018 6:08:37 PM By: Worthy Keeler PA-C Entered By: Worthy Keeler on 10/26/2018 18:07:58 Gunnels, [name] ([id number]) Ohalloran, [name] ([id number]) -------------------------------------------------------------------------------- ROS/PFSH Details Patient Name: Daye, Enisa C. Date of Service: 10/22/2018 3:15 PM Medical Record Number: [id number] Patient Account Number: [id number] Date of Birth/Sex: 11-11-1927 (85 y.o. F) Treating RN: Cornell Barman Primary Care Provider: Ria Bush Other Clinician: Referring Provider: Ria Bush Treating Provider/Extender: Melburn Hake, Aleathia Purdy Weeks in Treatment: 30 Information Obtained From Patient Constitutional Symptoms (General Health) Complaints and Symptoms: Negative for: Fatigue; Fever; Chills; Marked Weight Change Medical History: Past Medical History Notes: High Blood Pressure, open heart surgery, gall stone, hysterectomy; Fluid pill, Anxiety Respiratory Complaints and  Symptoms: Negative for: Chronic or frequent coughs; Shortness of Breath Medical History: Negative for: Aspiration; Asthma; Chronic Obstructive Pulmonary Disease (COPD); Pneumothorax; Sleep Apnea; Tuberculosis Cardiovascular Complaints and Symptoms: Positive for: LE edema Negative for: Chest pain Medical History: Positive for: Arrhythmia; Coronary Artery Disease; Hypertension; Myocardial Infarction Negative for: Angina; Congestive Heart Failure; Deep Vein Thrombosis; Hypotension; Peripheral Arterial Disease; Peripheral Venous Disease; Phlebitis Psychiatric Complaints and Symptoms: Negative for: Anxiety; Claustrophobia Eyes Medical History: Positive for: Cataracts - removed Negative for: Glaucoma; Optic Neuritis Hematologic/Lymphatic Medical History: Positive for: Anemia Negative for: Hemophilia; Human Immunodeficiency Virus; Lymphedema; Sickle Cell Disease Endocrine AZRAEL, HUSS ([id number]) Medical History: Positive for: Type II Diabetes Negative for: Type I Diabetes Time with diabetes: no idea Treated with: Oral agents Blood sugar tested every day: No Genitourinary Medical History: Negative for: End Stage Renal Disease Immunological Medical History: Negative for: Lupus Erythematosus; Raynaudos; Scleroderma Integumentary (Skin) Medical  History: Negative for: History of Burn; History of pressure wounds Musculoskeletal Medical History: Positive for: Osteoarthritis Negative for: Gout; Rheumatoid Arthritis; Osteomyelitis Neurologic Medical History: Negative for: Dementia; Neuropathy; Quadriplegia; Paraplegia; Seizure Disorder Oncologic Medical History: Negative for: Received Chemotherapy; Received Radiation HBO Extended History Items Eyes: Cataracts Immunizations Pneumococcal Vaccine: Received Pneumococcal Vaccination: Yes Implantable Devices No devices added Family and Social History Cancer: No; Diabetes: Yes - Father,Siblings,Child; Heart Disease: No;  Hypertension: Yes - Child; Kidney Disease: No; Lung Disease: Yes - Child; Seizures: No; Stroke: Yes - Mother; Thyroid Problems: No; Former smoker; Marital Status - Single; Alcohol Use: Never; Drug Use: No History; Caffeine Use: Daily; Financial Concerns: No; Food, Clothing or Shelter Needs: No; Support System Lacking: No; Transportation Concerns: No Physician Affirmation I have reviewed and agree with the above information. DORANN, DAVIDSON ([id number]) Electronic Signature(s) Signed: 10/26/2018 6:08:37 PM By: Worthy Keeler PA-C Signed: 11/02/2018 3:59:58 PM By: Gretta Cool BSN, RN, CWS, Kim RN, BSN Entered By: Worthy Keeler on 10/26/2018 18:07:19 Willow, [name] ([id number]) -------------------------------------------------------------------------------- SuperBill Details Patient Name: Boozer, Karrisa C. Date of Service: 10/22/2018 Medical Record Number: [id number] Patient Account Number: [id number] Date of Birth/Sex: 08/28/1927 (85 y.o. F) Treating RN: Cornell Barman Primary Care Provider: Ria Bush Other Clinician: Referring Provider: Ria Bush Treating Provider/Extender: Melburn Hake, Angle Karel Weeks in Treatment: 30 Diagnosis Coding ICD-10 Codes Code Description I87.331 Chronic venous hypertension (idiopathic) with ulcer and inflammation of right lower extremity E11.622 Type 2 diabetes mellitus with other skin ulcer L97.812 Non-pressure chronic ulcer of other part of right lower leg with fat layer exposed L03.115 Cellulitis of right lower limb Facility Procedures CPT4 Code Description: [id number] - DEBRIDE WOUND 1ST 20 SQ CM OR < ICD-10 Diagnosis Description L97.812 Non-pressure chronic ulcer of other part of right lower leg wit Modifier: h fat layer expo Quantity: 1 sed Physician Procedures CPT4 Code Description: [id number] - WC PHYS DEBR WO ANESTH 20 SQ CM ICD-10 Diagnosis Description A83.419 Non-pressure chronic ulcer of other part of right lower leg wit Modifier: h  fat layer expo Quantity: 1 sed Electronic Signature(s) Signed: 10/26/2018 6:08:37 PM By: Worthy Keeler PA-C Entered By: Worthy Keeler on 10/22/2018 22:29:02

## 2018-11-05 ENCOUNTER — Other Ambulatory Visit: Payer: Self-pay

## 2018-11-05 ENCOUNTER — Encounter: Payer: Medicare Other | Attending: Physician Assistant | Admitting: Physician Assistant

## 2018-11-05 DIAGNOSIS — L03115 Cellulitis of right lower limb: Secondary | ICD-10-CM | POA: Diagnosis not present

## 2018-11-05 DIAGNOSIS — E11622 Type 2 diabetes mellitus with other skin ulcer: Secondary | ICD-10-CM | POA: Insufficient documentation

## 2018-11-05 DIAGNOSIS — I2510 Atherosclerotic heart disease of native coronary artery without angina pectoris: Secondary | ICD-10-CM | POA: Diagnosis not present

## 2018-11-05 DIAGNOSIS — I87331 Chronic venous hypertension (idiopathic) with ulcer and inflammation of right lower extremity: Secondary | ICD-10-CM | POA: Insufficient documentation

## 2018-11-05 DIAGNOSIS — I252 Old myocardial infarction: Secondary | ICD-10-CM | POA: Diagnosis not present

## 2018-11-05 DIAGNOSIS — Z87891 Personal history of nicotine dependence: Secondary | ICD-10-CM | POA: Diagnosis not present

## 2018-11-05 DIAGNOSIS — M1990 Unspecified osteoarthritis, unspecified site: Secondary | ICD-10-CM | POA: Insufficient documentation

## 2018-11-05 DIAGNOSIS — L97812 Non-pressure chronic ulcer of other part of right lower leg with fat layer exposed: Secondary | ICD-10-CM | POA: Diagnosis not present

## 2018-11-05 DIAGNOSIS — M7989 Other specified soft tissue disorders: Secondary | ICD-10-CM | POA: Diagnosis not present

## 2018-11-05 NOTE — Progress Notes (Addendum)
[name], [name] ([id number]) Visit Report for 11/05/2018 Chief Complaint Document Details Patient Name: [name], [name] Date of Service: 11/05/2018 2:00 PM Medical Record Number: [id number] Patient Account Number: [id number] Date of Birth/Sex: 1927-05-05 (85 y.o. F) Treating RN: Harold Barban Primary Care Provider: Ria Bush Other Clinician: Referring Provider: Ria Bush Treating Provider/Extender: Melburn Hake, HOYT Weeks in Treatment: 7 Information Obtained from: Patient Chief Complaint RLE wounds Electronic Signature(s) Signed: 11/05/2018 1:55:05 PM By: Worthy Keeler PA-C Entered By: Worthy Keeler on 11/05/2018 13:55:05 [name], [name] ([id number]) -------------------------------------------------------------------------------- HPI Details Patient Name: [name], [name] Date of Service: 11/05/2018 2:00 PM Medical Record Number: [id number] Patient Account Number: [id number] Date of Birth/Sex: 07-16-1927 (85 y.o. F) Treating RN: Harold Barban Primary Care Provider: Ria Bush Other Clinician: Referring Provider: Ria Bush Treating Provider/Extender: Melburn Hake, HOYT Weeks in Treatment: 32 History of Present Illness HPI Description: Readmission: 03/23/18 on evaluation today patient presents for readmission entire clinic concerning issues that she has been having for the past couple of weeks in regard to her right lower extremity. The medial injury actually occurred when her dog tried to take a blanket from her and subsequently scratched her with his toenail. Subsequently the lateral injury was a wound where she dropped a knife in her kitchen and hit the side of her leg. With that being said both have been open for some time and home health nurse that has been coming out marked for her where the redness was several days ago. Nonetheless this has spread will be on the margin of what was marked. She was placed on Keflex which was started two days ago  but at this point it does not seem to have helped with any improvement in the overall appearance of the wounds. No fevers, chills, nausea, or vomiting noted at this time. 04/02/18 Seen today for follow up and management of right lower extremity wounds. Right lateral wound with adherent slough. She is complaining of pain to touch which limited exam. Recently treated in the ED for cellulitis and wound culture positive for MRSA. Completed a dose of Vancomycin and transitioned to doxycycline by mouth. Erythema remains present with improvement since last visit. Right medial wound with scab present. No fevers, chills, nausea, or vomiting noted at this time. 04/10/18 on evaluation today patient appears to be doing a Calvario better in regard to her right lateral lower [name]. The slough does seem to be coming off although it is slow from the wound bed. Fortunately there does not appear to be any evidence of infection at this time which is good news. With that being said I do believe that she is going to require longer treatment with the Santyl simply due to the fact that I'm not able to really debride the wound due to the pain that she is experiencing. 04/16/18 upon evaluation today patient's wound bed currently on the lateral lower extremity on the right appears to be doing a Sween better at this point. We are utilizing sensible currently. Overall I'm very pleased with how things are progressing although there still is some work to do before this will be completely healed. No fevers, chills, nausea, or vomiting noted at this time. 04/23/18 on evaluation today patient appears to be doing about the same at this point maybe just a Latendresse bit smaller than last week's evaluation. She continues to have some slough buildup on the surface of the wound and has very Bateson granulation unfortunately. This seems to be very slow moving  I'm getting somewhat concerned about the possibility of her having decreased  blood flow which is leading to her subsequently not being able to heal this area as quickly as she shut otherwise. She is currently utilizing Santyl. 05/01/18 on evaluation today patient appears to be doing somewhat poorly in regard to her lower extremity ulcer. At this point she's been tolerating the dressing changes to some degree without complication. With that being said she does note of the past several days that she has been having more pain unfortunately. On inspection it appears that she may have signs of infection which would account for the pain. 05/07/18 on evaluation today patient's wound actually appears to be doing better she's not having as much erythema in the right lower extremity as compared to last week. Fortunately there's no signs of infection this is good news as well. Overall very pleased with the progress that she has made up to this point. 05/14/18 on evaluation today patient actually appears to be doing rather well in regard to her right lower extremity ulcer. She does not have erythema that was characterizing the cellulitis prior to initiation of antibiotic therapy. With that being said her wound still appears to be somewhat slow in healing I believe this may be arterial in nature that is my concern at least. With that being said I do want her to see the vascular specialist as soon as possible we been trying to get this set up I'm not sure if she's not answering the phone or what exactly is going on here. [name], [name] ([id number]) 05/21/18 on evaluation today patient's wound actually appears to be doing a Yam better possibly some additional granulation compared to my last evaluation. With that being said she still continues to have a lot of pain especially when her legs are elevated such as sitting in the chair here in the office. There does not appear to be any signs of infection which is good news. 05/28/18 on evaluation today patient appears to be doing a Westenberger  better in regard to her right lateral lower [name]. Again she was supposed to have an appointment with vascular unfortunately they attempted to call her three times and unfortunately were unable to get anything scheduled. That reason closed up for him. She tells me that if the phone is answered and she doesn't recognize what is that she will hang up as well those that she lives with. Obviously she's not gonna recognize somebody calling from the office that she has not previously spoken with. Nonetheless the patient states that she does not remember any fall. I think if we're gonna get her in with vascular specialist we're gonna have to try to make the appointment for her. 06/04/2018 Seen today for follow up and management of right lateral lower extremity wound. Adherent slough present over surface of wound. Scheduled to follow up for ABI/TBI on 06/06/2018 with vein and vascular specialist. She is hard of hearing. Wound is sensitive to touch. No signs or symptoms of infection. Denies any recent fevers, chills, or SOB. 06/11/18 on evaluation today patient actually appears to be doing very well in regard to her right lateral lower extremity ulcer. She's been tolerating the dressing changes without complication. Fortunately there's no evidence of infection. With that being said she did have her arterial studies which revealed that she had a very low ABI which was 0.67 on the right with a TBI of 0.32 and 0.65 on the left with a TBI of 0.16. I  do believe she needs to see vascular and we are working on that referral as well. 06/21/18 on evaluation today patient's wound actually appears to be doing okay she does have an appointment with vascular coming up on the 24th. Nonetheless she's been having some issues that are more urinary in nature right now that are causing trouble for her. Fortunately there's no signs of severe sepsis although she is running a low-grade temperature 99.9 on evaluation today. She  also is having some issues right now with chills as well as urinary urgency and frequency. Again I feel like the city is more urinary and symptomology I do not believe she has anything upper respiratory which is good news. Nonetheless I think this may need to be checked out more quickly especially in light of the fact that she's having significant back pain at this point. 07/02/18 on evaluation today patient appears to be doing okay in regard to her ulcer on the right lower extremity. She's been tolerating the dressing changes without complication. Fortunately there does not appear to be any signs of infection which is good news. No fevers, chills, nausea, or vomiting noted at this time. 07/10/18 on evaluation today patient appears to be doing well in regard to her right lateral lower extremity ulcer all things considering. She's been using this form this seems to be the one thing that she can really do on her own and she seems to be making some progress obviously help with this matter. I think the biggest issue limiting her feeling speed is her blood flow she canceled her original appointment with vascular her next one is not till May. She was worried about the Covid-19 Virus and having to see another physician. Nonetheless there's no signs of active infection at this time. 07/16/18 on evaluation today patient appears to be doing okay in regard to her right lateral lower extremity ulcer. She's been tolerating the dressing changes without complication. Fortunately there's no signs of active infection at this time. No fevers, chills, nausea, or vomiting noted at this time. 07/23/18 on evaluation today patient appears to be doing very well in regard to her right lateral load should be ulcer all things considering. She still again is not seeing vascular until mid May in the meantime she's been utilizing the over-the-counter tripling about appointment which seems to be the only thing she's able to do  routinely on her own without getting confused or frustrated. Overall it seems to be doing okay fortunately she's showing no signs of infection. 07/30/18 upon evaluation today patient actually appears to be doing about the same in regard to her wound in general. She is making very slow progress most likely again due to the issue with her blood flow she sees vascular towards the middle of news. In the meantime we've really been trying to just keep things in the control from the infection standpoint and allow for any healing that could take place up until appointment she can see vascular. 08/06/18 upon evaluation today patient's wound actually appears to be doing about the same although the wound bed may show Faison bit more granulation which is good news. Fortunately there's no signs of active infection at this time. No fevers, chills, nausea, or vomiting noted at this time. 08/14/18 on evaluation today patient actually appears to be doing about the same in regard to her lower extremity ulcer. [name], [name] ([id number]) Fortunately there's no signs of active infection at this time which is good news. Fortunately the patient  has been tolerating the dressing changes without complication. No fevers, chills, nausea, or vomiting noted at this time. She does have her appointment with vascular next week. 08/20/18 on evaluation today patient actually appears to be doing a Moritz better in my opinion with regard to her lower extremity ulcer. She's been tolerating the dressing changes without complication. Fortunately there's no signs of active infection at this time which is good news. She actually will be seen vascular tomorrow. 08/30/18 on evaluation today patient's wound actually appears to be showing some signs of improvement which is good news. Fortunately there's no signs of infection. She does the the vascular doctor on June 4. 09/10/18 on evaluation today patient is status post having had an angiogram  with Dr. Lucky Cowboy. Unfortunately she tells me that she still having a lot of discomfort although the wound bed itself seems to be doing better. She does have more swelling of the right lower extremity but nothing too significant. I think this is causing Gauthreaux bit of increased pain right now as well that is unfortunate. No fevers, chills, nausea, or vomiting noted at this time. 09/17/18 on evaluation today patient actually appears to be doing much better today compared to last week's evaluation. I'm very pleased in this regard. Fortunately there's no signs of active infection which is also good news. 09/24/18 on evaluation today patient appears to be doing well in regard to the wounds on the right lateral lower extremity. Since her vascular intervention I do feel like she's made excellent progress. Fortunately there's no signs of active infection at this time. No fevers, chills, nausea, or vomiting noted at this time. 10/01/18 on evaluation today patient actually appears to be doing well in regard to right lateral lower Trinity ulcers. I think it's the right time at this point to go ahead and switch to [facility] as the dressing of choice. We've been utilizing the alginate and that has been beneficial but she's not noticing as much drainage in fact it's actually staying more dry at this point. We're gonna see how things do with the dressing change I'll see her back next week I'll be seeing that regard in the meantime she continues to be depressed we did talk about the possibility of seeing a psychiatrist actually has one who she sees and has seen for quite a while. She actually sees her later this month. 10/08/18 on evaluation today patient's wound bed actually showed signs of improvement really at both locations. She has been getting better week by week and her pain is also not nearly as bad as what it was in the past. Overall I think that the vascular intervention has been extremely beneficial for  her. 10/15/18-Patient returns at 1 week, she has currently 2 small wounds on the right leg both of which appear to be improving but continuing silver collagen to this area 10/22/18 on evaluation today patient actually appears to be doing better with regard to her right lower extremity ulcers. Both are measuring smaller compared to when I last saw her. Fortunately she is not having as much pain as she has in the past in fact she did not even really talk about the pain today which is actually the first time that that has occurred. Normally the entire conversation is centered around how much he hurts. Overall she seemed to be in Veley better spirits in my pinion. 10/29/2018 on evaluation today patient actually appears to be doing quite well with regard to her right lower extremity ulcers. In  fact both locations that we have been treating and taking care of up to this point seem to be doing excellent. There does not appear to be any signs of active infection and in fact both appear to be very close to complete closure. I believe both may have slight opening still remaining obviously we are going to check this further today. 11/05/2018 on evaluation today patient appears to be doing excellent in regard to her right lower extremity. In fact on initial inspection I thought she might actually be completely healed. Upon closer inspection I realize that she did have still a small pinpoint opening remaining which is actually the secondary ulcer that occurred as a result of her swelling following the vascular procedure. Nonetheless this is still open her wounds are not completely closed though she is all but healed at this point today. Electronic Signature(s) Signed: 11/05/2018 5:16:35 PM By: Worthy Keeler PA-C Entered By: Worthy Keeler on 11/05/2018 17:16:34 Vondrasek, [name] ([id number]) -------------------------------------------------------------------------------- Physical Exam Details Patient Name:  Farina, Taleeyah C. Date of Service: 11/05/2018 2:00 PM Medical Record Number: [id number] Patient Account Number: [id number] Date of Birth/Sex: [birth year] (85 y.o. F) Treating RN: Harold Barban Primary Care Provider: Ria Bush Other Clinician: Referring Provider: Ria Bush Treating Provider/Extender: STONE III, HOYT Weeks in Treatment: 81 Constitutional Well-nourished and well-hydrated in no acute distress. Respiratory normal breathing without difficulty. clear to auscultation bilaterally. Cardiovascular regular rate and rhythm with normal S1, S2. Psychiatric this patient is able to make decisions and demonstrates good insight into disease process. Alert and Oriented x 3. pleasant and cooperative. Notes Patient's wound bed actually showed signs of excellent improvement today and in fact almost appears to be completely healed I think that her treatment plan is excellent and again I think she is very close to complete resolution we likely need just 1 more week and this will indeed be closed. Electronic Signature(s) Signed: 11/05/2018 5:18:10 PM By: Worthy Keeler PA-C Entered By: Worthy Keeler on 11/05/2018 17:18:09 [name], [name] ([id number]) -------------------------------------------------------------------------------- Physician Orders Details Patient Name: Stlouis, Suraya C. Date of Service: 11/05/2018 2:00 PM Medical Record Number: [id number] Patient Account Number: [id number] Date of Birth/Sex: 03-01-1928 (85 y.o. F) Treating RN: Harold Barban Primary Care Provider: Ria Bush Other Clinician: Referring Provider: Ria Bush Treating Provider/Extender: Melburn Hake, HOYT Weeks in Treatment: 28 Verbal / Phone Orders: No Diagnosis Coding ICD-10 Coding Code Description I87.331 Chronic venous hypertension (idiopathic) with ulcer and inflammation of right lower extremity E11.622 Type 2 diabetes mellitus with other skin ulcer L97.812 Non-pressure  chronic ulcer of other part of right lower leg with fat layer exposed L03.115 Cellulitis of right lower limb Wound Cleansing Wound #8 Right,Lateral Lower Leg o Clean wound with Normal Saline. Anesthetic (add to Medication List) Wound #8 Right,Lateral Lower Leg o Topical Lidocaine 4% cream applied to wound bed prior to debridement (In Clinic Only). Primary Wound Dressing Wound #8 Right,Lateral Lower Leg o Xeroform Secondary Dressing Wound #8 Right,Lateral Lower Leg o Conform/Kerlix o Other - Stretchy to secure Dressing Change Frequency Wound #8 Right,Lateral Lower Leg o Change Dressing Monday, Wednesday, Friday Follow-up Appointments Wound #8 Right,Lateral Lower Leg o Return Appointment in 1 week. Edema Control Wound #8 Right,Lateral Lower Leg o Elevate legs to the level of the heart and pump ankles as often as possible Home Health Wound #8 Right,Lateral Lower Leg o [place] Nurse may visit PRN to address patientos wound care needs. [name], [name] ([id number]) o  FACE TO FACE ENCOUNTER: MEDICARE and MEDICAID PATIENTS: I certify that this patient is under my care and that I had a face-to-face encounter that meets the physician face-to-face encounter requirements with this patient on this date. The encounter with the patient was in whole or in part for the following MEDICAL CONDITION: (primary reason for [place]) MEDICAL NECESSITY: I certify, that based on my findings, NURSING services are a medically necessary home health service. HOME BOUND STATUS: I certify that my clinical findings support that this patient is homebound (i.e., Due to illness or injury, pt requires aid of supportive devices such as crutches, cane, wheelchairs, walkers, the use of special transportation or the assistance of another person to leave their place of residence. There is a normal inability to leave the home and doing so requires  considerable and taxing effort. Other absences are for medical reasons / religious services and are infrequent or of short duration when for other reasons). o If current dressing causes regression in wound condition, may D/C ordered dressing product/s and apply Normal Saline Moist Dressing daily until next [place] / Other MD appointment. [place] of regression in wound condition at [phone]. o Please direct any NON-WOUND related issues/requests for orders to patient's Primary Care Physician Electronic Signature(s) Signed: 11/05/2018 4:11:30 PM By: Harold Barban Signed: 11/05/2018 6:32:30 PM By: Worthy Keeler PA-C Entered By: Harold Barban on 11/05/2018 15:04:29 [name], [name] ([id number]) -------------------------------------------------------------------------------- Problem List Details Patient Name: Deluna, Vernadette C. Date of Service: 11/05/2018 2:00 PM Medical Record Number: [id number] Patient Account Number: [id number] Date of Birth/Sex: Dec 07, 1927 (85 y.o. F) Treating RN: Harold Barban Primary Care Provider: Ria Bush Other Clinician: Referring Provider: Ria Bush Treating Provider/Extender: Melburn Hake, HOYT Weeks in Treatment: 32 Active Problems ICD-10 Evaluated Encounter Code Description Active Date Today Diagnosis I87.331 Chronic venous hypertension (idiopathic) with ulcer and 03/23/2018 No Yes inflammation of right lower extremity E11.622 Type 2 diabetes mellitus with other skin ulcer 03/23/2018 No Yes L97.812 Non-pressure chronic ulcer of other part of right lower leg 03/23/2018 No Yes with fat layer exposed L03.115 Cellulitis of right lower limb 03/23/2018 No Yes Inactive Problems Resolved Problems Electronic Signature(s) Signed: 11/05/2018 1:54:41 PM By: Worthy Keeler PA-C Entered By: Worthy Keeler on 11/05/2018 13:54:40 Hansell, [name]  ([id number]) -------------------------------------------------------------------------------- Progress Note Details Patient Name: [name], [name] Date of Service: 11/05/2018 2:00 PM Medical Record Number: [id number] Patient Account Number: [id number] Date of Birth/Sex: 02/28/1928 (85 y.o. F) Treating RN: Harold Barban Primary Care Provider: Ria Bush Other Clinician: Referring Provider: Ria Bush Treating Provider/Extender: Melburn Hake, HOYT Weeks in Treatment: 55 Subjective Chief Complaint Information obtained from Patient RLE wounds History of Present Illness (HPI) Readmission: 03/23/18 on evaluation today patient presents for readmission entire clinic concerning issues that she has been having for the past couple of weeks in regard to her right lower extremity. The medial injury actually occurred when her dog tried to take a blanket from her and subsequently scratched her with his toenail. Subsequently the lateral injury was a wound where she dropped a knife in her kitchen and hit the side of her leg. With that being said both have been open for some time and home health nurse that has been coming out marked for her where the redness was several days ago. Nonetheless this has spread will be on the margin of what was marked. She was placed on Keflex which was started two days ago but at this point it does not  seem to have helped with any improvement in the overall appearance of the wounds. No fevers, chills, nausea, or vomiting noted at this time. 04/02/18 Seen today for follow up and management of right lower extremity wounds. Right lateral wound with adherent slough. She is complaining of pain to touch which limited exam. Recently treated in the ED for cellulitis and wound culture positive for MRSA. Completed a dose of Vancomycin and transitioned to doxycycline by mouth. Erythema remains present with improvement since last visit. Right medial wound with scab present. No  fevers, chills, nausea, or vomiting noted at this time. 04/10/18 on evaluation today patient appears to be doing a Dinkins better in regard to her right lateral lower [name]. The slough does seem to be coming off although it is slow from the wound bed. Fortunately there does not appear to be any evidence of infection at this time which is good news. With that being said I do believe that she is going to require longer treatment with the Santyl simply due to the fact that I'm not able to really debride the wound due to the pain that she is experiencing. 04/16/18 upon evaluation today patient's wound bed currently on the lateral lower extremity on the right appears to be doing a Balli better at this point. We are utilizing sensible currently. Overall I'm very pleased with how things are progressing although there still is some work to do before this will be completely healed. No fevers, chills, nausea, or vomiting noted at this time. 04/23/18 on evaluation today patient appears to be doing about the same at this point maybe just a Pinette bit smaller than last week's evaluation. She continues to have some slough buildup on the surface of the wound and has very Pfarr granulation unfortunately. This seems to be very slow moving I'm getting somewhat concerned about the possibility of her having decreased blood flow which is leading to her subsequently not being able to heal this area as quickly as she shut otherwise. She is currently utilizing Santyl. 05/01/18 on evaluation today patient appears to be doing somewhat poorly in regard to her lower extremity ulcer. At this point she's been tolerating the dressing changes to some degree without complication. With that being said she does note of the past several days that she has been having more pain unfortunately. On inspection it appears that she may have signs of infection which would account for the pain. 05/07/18 on evaluation today patient's wound  actually appears to be doing better she's not having as much erythema in the right lower extremity as compared to last week. Fortunately there's no signs of infection this is good news as well. Overall very pleased with the progress that she has made up to this point. [name], [name] ([id number]) 05/14/18 on evaluation today patient actually appears to be doing rather well in regard to her right lower extremity ulcer. She does not have erythema that was characterizing the cellulitis prior to initiation of antibiotic therapy. With that being said her wound still appears to be somewhat slow in healing I believe this may be arterial in nature that is my concern at least. With that being said I do want her to see the vascular specialist as soon as possible we been trying to get this set up I'm not sure if she's not answering the phone or what exactly is going on here. 05/21/18 on evaluation today patient's wound actually appears to be doing a Stokke better possibly some additional granulation  compared to my last evaluation. With that being said she still continues to have a lot of pain especially when her legs are elevated such as sitting in the chair here in the office. There does not appear to be any signs of infection which is good news. 05/28/18 on evaluation today patient appears to be doing a [place] better in regard to her right lateral lower [name]. Again she was supposed to have an appointment with vascular unfortunately they attempted to call her three times and unfortunately were unable to get anything scheduled. That reason closed up for him. She tells me that if the phone is answered and she doesn't recognize what is that she will hang up as well those that she lives with. Obviously she's not gonna recognize somebody calling from the office that she has not previously spoken with. Nonetheless the patient states that she does not remember any fall. I think if we're gonna get her in with  vascular specialist we're gonna have to try to make the appointment for her. 06/04/2018 Seen today for follow up and management of right lateral lower extremity wound. Adherent slough present over surface of wound. Scheduled to follow up for ABI/TBI on 06/06/2018 with vein and vascular specialist. She is hard of hearing. Wound is sensitive to touch. No signs or symptoms of infection. Denies any recent fevers, chills, or SOB. 06/11/18 on evaluation today patient actually appears to be doing very well in regard to her right lateral lower extremity ulcer. She's been tolerating the dressing changes without complication. Fortunately there's no evidence of infection. With that being said she did have her arterial studies which revealed that she had a very low ABI which was 0.67 on the right with a TBI of 0.32 and 0.65 on the left with a TBI of 0.16. I do believe she needs to see vascular and we are working on that referral as well. 06/21/18 on evaluation today patient's wound actually appears to be doing okay she does have an appointment with vascular coming up on the 24th. Nonetheless she's been having some issues that are more urinary in nature right now that are causing trouble for her. Fortunately there's no signs of severe sepsis although she is running a low-grade temperature 99.9 on evaluation today. She also is having some issues right now with chills as well as urinary urgency and frequency. Again I feel like the city is more urinary and symptomology I do not believe she has anything upper respiratory which is good news. Nonetheless I think this may need to be checked out more quickly especially in light of the fact that she's having significant back pain at this point. 07/02/18 on evaluation today patient appears to be doing okay in regard to her ulcer on the right lower extremity. She's been tolerating the dressing changes without complication. Fortunately there does not appear to be any signs of  infection which is good news. No fevers, chills, nausea, or vomiting noted at this time. 07/10/18 on evaluation today patient appears to be doing well in regard to her right lateral lower extremity ulcer all things considering. She's been using this form this seems to be the one thing that she can really do on her own and she seems to be making some progress obviously help with this matter. I think the biggest issue limiting her feeling speed is her blood flow she canceled her original appointment with vascular her next one is not till May. She was worried about the Covid-19  Virus and having to see another physician. Nonetheless there's no signs of active infection at this time. 07/16/18 on evaluation today patient appears to be doing okay in regard to her right lateral lower extremity ulcer. She's been tolerating the dressing changes without complication. Fortunately there's no signs of active infection at this time. No fevers, chills, nausea, or vomiting noted at this time. 07/23/18 on evaluation today patient appears to be doing very well in regard to her right lateral load should be ulcer all things considering. She still again is not seeing vascular until mid May in the meantime she's been utilizing the over-the-counter tripling about appointment which seems to be the only thing she's able to do routinely on her own without getting confused or frustrated. Overall it seems to be doing okay fortunately she's showing no signs of infection. 07/30/18 upon evaluation today patient actually appears to be doing about the same in regard to her wound in general. She is making very slow progress most likely again due to the issue with her blood flow she sees vascular towards the middle of news. In the meantime we've really been trying to just keep things in the control from the infection standpoint and allow for any healing that could take place up until appointment she can see vascular. [name], [name]  ([id number]) 08/06/18 upon evaluation today patient's wound actually appears to be doing about the same although the wound bed may show Condon bit more granulation which is good news. Fortunately there's no signs of active infection at this time. No fevers, chills, nausea, or vomiting noted at this time. 08/14/18 on evaluation today patient actually appears to be doing about the same in regard to her lower extremity ulcer. Fortunately there's no signs of active infection at this time which is good news. Fortunately the patient has been tolerating the dressing changes without complication. No fevers, chills, nausea, or vomiting noted at this time. She does have her appointment with vascular next week. 08/20/18 on evaluation today patient actually appears to be doing a Kuri better in my opinion with regard to her lower extremity ulcer. She's been tolerating the dressing changes without complication. Fortunately there's no signs of active infection at this time which is good news. She actually will be seen vascular tomorrow. 08/30/18 on evaluation today patient's wound actually appears to be showing some signs of improvement which is good news. Fortunately there's no signs of infection. She does the the vascular doctor on June 4. 09/10/18 on evaluation today patient is status post having had an angiogram with Dr. Lucky Cowboy. Unfortunately she tells me that she still having a lot of discomfort although the wound bed itself seems to be doing better. She does have more swelling of the right lower extremity but nothing too significant. I think this is causing Gemme bit of increased pain right now as well that is unfortunate. No fevers, chills, nausea, or vomiting noted at this time. 09/17/18 on evaluation today patient actually appears to be doing much better today compared to last week's evaluation. I'm very pleased in this regard. Fortunately there's no signs of active infection which is also good news. 09/24/18 on  evaluation today patient appears to be doing well in regard to the wounds on the right lateral lower extremity. Since her vascular intervention I do feel like she's made excellent progress. Fortunately there's no signs of active infection at this time. No fevers, chills, nausea, or vomiting noted at this time. 10/01/18 on evaluation today patient actually appears  to be doing well in regard to right lateral lower Trinity ulcers. I think it's the right time at this point to go ahead and switch to [facility] as the dressing of choice. We've been utilizing the alginate and that has been beneficial but she's not noticing as much drainage in fact it's actually staying more dry at this point. We're gonna see how things do with the dressing change I'll see her back next week I'll be seeing that regard in the meantime she continues to be depressed we did talk about the possibility of seeing a psychiatrist actually has one who she sees and has seen for quite a while. She actually sees her later this month. 10/08/18 on evaluation today patient's wound bed actually showed signs of improvement really at both locations. She has been getting better week by week and her pain is also not nearly as bad as what it was in the past. Overall I think that the vascular intervention has been extremely beneficial for her. 10/15/18-Patient returns at 1 week, she has currently 2 small wounds on the right leg both of which appear to be improving but continuing silver collagen to this area 10/22/18 on evaluation today patient actually appears to be doing better with regard to her right lower extremity ulcers. Both are measuring smaller compared to when I last saw her. Fortunately she is not having as much pain as she has in the past in fact she did not even really talk about the pain today which is actually the first time that that has occurred. Normally the entire conversation is centered around how much he hurts. Overall she seemed to  be in Carol better spirits in my pinion. 10/29/2018 on evaluation today patient actually appears to be doing quite well with regard to her right lower extremity ulcers. In fact both locations that we have been treating and taking care of up to this point seem to be doing excellent. There does not appear to be any signs of active infection and in fact both appear to be very close to complete closure. I believe both may have slight opening still remaining obviously we are going to check this further today. 11/05/2018 on evaluation today patient appears to be doing excellent in regard to her right lower extremity. In fact on initial inspection I thought she might actually be completely healed. Upon closer inspection I realize that she did have still a small pinpoint opening remaining which is actually the secondary ulcer that occurred as a result of her swelling following the vascular procedure. Nonetheless this is still open her wounds are not completely closed though she is all but healed at this point today. [name], [name] ([id number]) Patient History Information obtained from Patient. Family History Diabetes - Father,Siblings,Child, Hypertension - Child, Lung Disease - Child, Stroke - Mother, No family history of Cancer, Heart Disease, Kidney Disease, Seizures, Thyroid Problems. Social History Former smoker, Marital Status - Single, Alcohol Use - Never, Drug Use - No History, Caffeine Use - Daily. Medical History Eyes Patient has history of Cataracts - removed Denies history of Glaucoma, Optic Neuritis Hematologic/Lymphatic Patient has history of Anemia Denies history of Hemophilia, Human Immunodeficiency Virus, Lymphedema, Sickle Cell Disease Respiratory Denies history of Aspiration, Asthma, Chronic Obstructive Pulmonary Disease (COPD), Pneumothorax, Sleep Apnea, Tuberculosis Cardiovascular Patient has history of Arrhythmia, Coronary Artery Disease, Hypertension, Myocardial  Infarction Denies history of Angina, Congestive Heart Failure, Deep Vein Thrombosis, Hypotension, Peripheral Arterial Disease, Peripheral Venous Disease, Phlebitis Endocrine Patient has history of  Type II Diabetes Denies history of Type I Diabetes Genitourinary Denies history of End Stage Renal Disease Immunological Denies history of Lupus Erythematosus, Raynaud s, Scleroderma Integumentary (Skin) Denies history of History of Burn, History of pressure wounds Musculoskeletal Patient has history of Osteoarthritis Denies history of Gout, Rheumatoid Arthritis, Osteomyelitis Neurologic Denies history of Dementia, Neuropathy, Quadriplegia, Paraplegia, Seizure Disorder Oncologic Denies history of Received Chemotherapy, Received Radiation Medical And Surgical History Notes Constitutional Symptoms (General Health) High Blood Pressure, open heart surgery, gall stone, hysterectomy; Fluid pill, Anxiety Review of Systems (ROS) Constitutional Symptoms (General Health) Denies complaints or symptoms of Fatigue, Fever, Chills, Marked Weight Change. Respiratory Denies complaints or symptoms of Chronic or frequent coughs, Shortness of Breath. Cardiovascular Complains or has symptoms of LE edema. Denies complaints or symptoms of Chest pain. Psychiatric Denies complaints or symptoms of Anxiety, Claustrophobia. Yehle, Gali C. ([id number]) Objective Constitutional Well-nourished and well-hydrated in no acute distress. Vitals Time Taken: 1:49 PM, Height: 58 in, Weight: 140 lbs, BMI: 29.3, Temperature: 98.4 F, Pulse: 81 bpm, Respiratory Rate: 16 breaths/min, Blood Pressure: 134/98 mmHg. Respiratory normal breathing without difficulty. clear to auscultation bilaterally. Cardiovascular regular rate and rhythm with normal S1, S2. Psychiatric this patient is able to make decisions and demonstrates good insight into disease process. Alert and Oriented x 3. pleasant and cooperative. General Notes:  Patient's wound bed actually showed signs of excellent improvement today and in fact almost appears to be completely healed I think that her treatment plan is excellent and again I think she is very close to complete resolution we likely need just 1 more week and this will indeed be closed. Integumentary (Hair, Skin) Wound #8 status is Open. Original cause of wound was Trauma. The wound is located on the Right,Lateral Lower Leg. The wound measures 0.1cm length x 0.1cm width x 0.1cm depth; 0.008cm^2 area and 0.001cm^3 volume. There is Fat Layer (Subcutaneous Tissue) Exposed exposed. There is no tunneling or undermining noted. There is a none present amount of drainage noted. The wound margin is flat and intact. There is no granulation within the wound bed. There is a large (67-100%) amount of necrotic tissue within the wound bed including Eschar. Assessment Active Problems ICD-10 Chronic venous hypertension (idiopathic) with ulcer and inflammation of right lower extremity Type 2 diabetes mellitus with other skin ulcer Non-pressure chronic ulcer of other part of right lower leg with fat layer exposed Cellulitis of right lower limb Plan Wound Cleansing: Wound #8 Right,Lateral Lower Leg: Clean wound with Normal Saline. Anesthetic (add to Medication List): Gobert, Industry ([id number]) Wound #8 Right,Lateral Lower Leg: Topical Lidocaine 4% cream applied to wound bed prior to debridement (In Clinic Only). Primary Wound Dressing: Wound #8 Right,Lateral Lower Leg: Xeroform Secondary Dressing: Wound #8 Right,Lateral Lower Leg: Conform/Kerlix Other - Stretchy to secure Dressing Change Frequency: Wound #8 Right,Lateral Lower Leg: Change Dressing Monday, Wednesday, Friday Follow-up Appointments: Wound #8 Right,Lateral Lower Leg: Return Appointment in 1 week. Edema Control: Wound #8 Right,Lateral Lower Leg: Elevate legs to the level of the heart and pump ankles as often as possible Home  Health: Wound #8 Right,Lateral Lower Leg: [place] Nurse may visit PRN to address patient s wound care needs. FACE TO FACE ENCOUNTER: MEDICARE and MEDICAID PATIENTS: I certify that this patient is under my care and that I had a face-to-face encounter that meets the physician face-to-face encounter requirements with this patient on this date. The encounter with the patient was in whole or in part for  the following MEDICAL CONDITION: (primary reason for Home Healthcare) MEDICAL NECESSITY: I certify, that based on my findings, NURSING services are a medically necessary home health service. HOME BOUND STATUS: I certify that my clinical findings support that this patient is homebound (i.e., Due to illness or injury, pt requires aid of supportive devices such as crutches, cane, wheelchairs, walkers, the use of special transportation or the assistance of another person to leave their place of residence. There is a normal inability to leave the home and doing so requires considerable and taxing effort. Other absences are for medical reasons / religious services and are infrequent or of short duration when for other reasons). If current dressing causes regression in wound condition, may D/C ordered dressing product/s and apply Normal Saline Moist Dressing daily until next [place] / Other MD appointment. [place] of regression in wound condition at [phone]. Please direct any NON-WOUND related issues/requests for orders to patient's Primary Care Physician 1. We are continuing with the Xeroform gauze dressing since she did well with this her leg did not seem as dry in the area of the right lateral lower extremity where she has the last remaining pinpoint opening. 2. We will continue to allow the patient to utilize her own home compression stockings as well since this seems to be controlling her swelling sufficiently at this point to prevent  any worsening overall. 3. Also suggest that she continue to elevate her legs as much as possible. We will see patient back for reevaluation in 1 week here in the clinic. If anything worsens or changes patient will contact our office for additional recommendations. Electronic Signature(s) Signed: 11/05/2018 5:19:04 PM By: Worthy Keeler PA-C Entered By: Worthy Keeler on 11/05/2018 17:19:04 Holstrom, [name] ([id number]) -------------------------------------------------------------------------------- ROS/PFSH Details Patient Name: Culhane, Jovanna C. Date of Service: 11/05/2018 2:00 PM Medical Record Number: [id number] Patient Account Number: [id number] Date of Birth/Sex: Apr 12, 1927 (85 y.o. F) Treating RN: Harold Barban Primary Care Provider: Ria Bush Other Clinician: Referring Provider: Ria Bush Treating Provider/Extender: STONE III, HOYT Weeks in Treatment: 62 Information Obtained From Patient Constitutional Symptoms (General Health) Complaints and Symptoms: Negative for: Fatigue; Fever; Chills; Marked Weight Change Medical History: Past Medical History Notes: High Blood Pressure, open heart surgery, gall stone, hysterectomy; Fluid pill, Anxiety Respiratory Complaints and Symptoms: Negative for: Chronic or frequent coughs; Shortness of Breath Medical History: Negative for: Aspiration; Asthma; Chronic Obstructive Pulmonary Disease (COPD); Pneumothorax; Sleep Apnea; Tuberculosis Cardiovascular Complaints and Symptoms: Positive for: LE edema Negative for: Chest pain Medical History: Positive for: Arrhythmia; Coronary Artery Disease; Hypertension; Myocardial Infarction Negative for: Angina; Congestive Heart Failure; Deep Vein Thrombosis; Hypotension; Peripheral Arterial Disease; Peripheral Venous Disease; Phlebitis Psychiatric Complaints and Symptoms: Negative for: Anxiety; Claustrophobia Eyes Medical History: Positive for: Cataracts - removed Negative for:  Glaucoma; Optic Neuritis Hematologic/Lymphatic Medical History: Positive for: Anemia Negative for: Hemophilia; Human Immunodeficiency Virus; Lymphedema; Sickle Cell Disease Endocrine ROGER, FASNACHT ([id number]) Medical History: Positive for: Type II Diabetes Negative for: Type I Diabetes Time with diabetes: no idea Treated with: Oral agents Blood sugar tested every day: No Genitourinary Medical History: Negative for: End Stage Renal Disease Immunological Medical History: Negative for: Lupus Erythematosus; Raynaudos; Scleroderma Integumentary (Skin) Medical History: Negative for: History of Burn; History of pressure wounds Musculoskeletal Medical History: Positive for: Osteoarthritis Negative for: Gout; Rheumatoid Arthritis; Osteomyelitis Neurologic Medical History: Negative for: Dementia; Neuropathy; Quadriplegia; Paraplegia; Seizure Disorder Oncologic Medical History: Negative for: Received Chemotherapy; Received Radiation HBO Extended History Items  Eyes: Cataracts Immunizations Pneumococcal Vaccine: Received Pneumococcal Vaccination: Yes Implantable Devices No devices added Family and Social History Cancer: No; Diabetes: Yes - Father,Siblings,Child; Heart Disease: No; Hypertension: Yes - Child; Kidney Disease: No; Lung Disease: Yes - Child; Seizures: No; Stroke: Yes - Mother; Thyroid Problems: No; Former smoker; Marital Status - Single; Alcohol Use: Never; Drug Use: No History; Caffeine Use: Daily; Financial Concerns: No; Food, Clothing or Shelter Needs: No; Support System Lacking: No; Transportation Concerns: No Physician Affirmation I have reviewed and agree with the above information. TRINIDI, TOPPINS ([id number]) Electronic Signature(s) Signed: 11/05/2018 6:32:30 PM By: Worthy Keeler PA-C Signed: 11/06/2018 3:07:27 PM By: Harold Barban Entered By: Worthy Keeler on 11/05/2018 17:17:56 [place], [name]  ([id number]) -------------------------------------------------------------------------------- SuperBill Details Patient Name: Lininger, Tyjai C. Date of Service: 11/05/2018 Medical Record Number: [id number] Patient Account Number: [id number] Date of Birth/Sex: 02/16/1928 (85 y.o. F) Treating RN: Harold Barban Primary Care Provider: Ria Bush Other Clinician: Referring Provider: Ria Bush Treating Provider/Extender: Melburn Hake, HOYT Weeks in Treatment: 32 Diagnosis Coding ICD-10 Codes Code Description I87.331 Chronic venous hypertension (idiopathic) with ulcer and inflammation of right lower extremity E11.622 Type 2 diabetes mellitus with other skin ulcer L97.812 Non-pressure chronic ulcer of other part of right lower leg with fat layer exposed L03.115 Cellulitis of right lower limb Facility Procedures CPT4 Code: 99872158 Description: 99213 - WOUND CARE VISIT-LEV 3 EST PT Modifier: Quantity: 1 Physician Procedures CPT4: Description Modifier Quantity Code 7276184 99214 - WC PHYS LEVEL 4 - EST PT 1 ICD-10 Diagnosis Description I87.331 Chronic venous hypertension (idiopathic) with ulcer and inflammation of right lower extremity E11.622 Type 2 diabetes mellitus with  other skin ulcer L97.812 Non-pressure chronic ulcer of other part of right lower leg with fat layer exposed L03.115 Cellulitis of right lower limb Electronic Signature(s) Signed: 11/05/2018 5:19:24 PM By: Worthy Keeler PA-C Entered By: Worthy Keeler on 11/05/2018 17:19:24

## 2018-11-05 NOTE — Progress Notes (Signed)
JEWELLE, WHITNER ([id number]) Visit Report for 11/05/2018 Arrival Information Details Patient Name: [name], [name] Date of Service: 11/05/2018 2:00 PM Medical Record Number: [id number] Patient Account Number: [id number] Date of Birth/Sex: Jul 07, 1927 (85 y.o. F) Treating RN: Harold Barban Primary Care Mardell Cragg: Ria Bush Other Clinician: Referring Deanna Wiater: Ria Bush Treating Lania Zawistowski/Extender: Melburn Hake, HOYT Weeks in Treatment: 68 Visit Information History Since Last Visit Added or deleted any medications: No Patient Arrived: Walker Any new allergies or adverse reactions: No Arrival Time: 13:46 Had a fall or experienced change in No Accompanied By: daughter in law activities of daily living that may affect Transfer Assistance: None risk of falls: Patient Identification Verified: Yes Signs or symptoms of abuse/neglect since last visito No Secondary Verification Process Completed: Yes Hospitalized since last visit: No Has Dressing in Place as Prescribed: Yes Pain Present Now: No Electronic Signature(s) Signed: 11/05/2018 4:15:29 PM By: Lorine Bears RCP, RRT, CHT Entered By: Lorine Bears on 11/05/2018 13:47:50 Rhee, [name] ([id number]) -------------------------------------------------------------------------------- Clinic Level of Care Assessment Details Patient Name: [name], [name] Date of Service: 11/05/2018 2:00 PM Medical Record Number: [id number] Patient Account Number: [id number] Date of Birth/Sex: Nov 15, 1927 (85 y.o. F) Treating RN: Harold Barban Primary Care Lechelle Wrigley: Ria Bush Other Clinician: Referring Dalores Weger: Ria Bush Treating Bridgett Hattabaugh/Extender: Melburn Hake, HOYT Weeks in Treatment: 32 Clinic Level of Care Assessment Items TOOL 4 Quantity Score []  - Use when only an EandM is performed on FOLLOW-UP visit 0 ASSESSMENTS - Nursing Assessment / Reassessment X - Reassessment of Co-morbidities  (includes updates in patient status) 1 10 X- 1 5 Reassessment of Adherence to Treatment Plan ASSESSMENTS - Wound and Skin Assessment / Reassessment X - Simple Wound Assessment / Reassessment - one wound 1 5 []  - 0 Complex Wound Assessment / Reassessment - multiple wounds []  - 0 Dermatologic / Skin Assessment (not related to wound area) ASSESSMENTS - Focused Assessment []  - Circumferential Edema Measurements - multi extremities 0 []  - 0 Nutritional Assessment / Counseling / Intervention []  - 0 Lower Extremity Assessment (monofilament, tuning fork, pulses) []  - 0 Peripheral Arterial Disease Assessment (using hand held doppler) ASSESSMENTS - Ostomy and/or Continence Assessment and Care []  - Incontinence Assessment and Management 0 []  - 0 Ostomy Care Assessment and Management (repouching, etc.) PROCESS - Coordination of Care X - Simple Patient / Family Education for ongoing care 1 15 []  - 0 Complex (extensive) Patient / Family Education for ongoing care []  - 0 Staff obtains Programmer, systems, Records, Test Results / Process Orders X- 1 10 Staff telephones HHA, Nursing Homes / Clarify orders / etc []  - 0 Routine Transfer to another Facility (non-emergent condition) []  - 0 Routine Hospital Admission (non-emergent condition) []  - 0 New Admissions / Biomedical engineer / Ordering NPWT, Apligraf, etc. []  - 0 Emergency Hospital Admission (emergent condition) X- 1 10 Simple Discharge Coordination Piper, Kripa C. ([id number]) []  - 0 Complex (extensive) Discharge Coordination PROCESS - Special Needs []  - Pediatric / Minor Patient Management 0 []  - 0 Isolation Patient Management []  - 0 Hearing / Language / Visual special needs []  - 0 Assessment of Community assistance (transportation, D/C planning, etc.) []  - 0 Additional assistance / Altered mentation []  - 0 Support Surface(s) Assessment (bed, cushion, seat, etc.) INTERVENTIONS - Wound Cleansing / Measurement X - Simple Wound  Cleansing - one wound 1 5 []  - 0 Complex Wound Cleansing - multiple wounds X- 1 5 Wound Imaging (photographs - any number of wounds) []  - 0 Wound Tracing (instead  of photographs) X- 1 5 Simple Wound Measurement - one wound []  - 0 Complex Wound Measurement - multiple wounds INTERVENTIONS - Wound Dressings X - Small Wound Dressing one or multiple wounds 1 10 []  - 0 Medium Wound Dressing one or multiple wounds []  - 0 Large Wound Dressing one or multiple wounds []  - 0 Application of Medications - topical []  - 0 Application of Medications - injection INTERVENTIONS - Miscellaneous []  - External ear exam 0 []  - 0 Specimen Collection (cultures, biopsies, blood, body fluids, etc.) []  - 0 Specimen(s) / Culture(s) sent or taken to Lab for analysis []  - 0 Patient Transfer (multiple staff / Civil Service fast streamer / Similar devices) []  - 0 Simple Staple / Suture removal (25 or less) []  - 0 Complex Staple / Suture removal (26 or more) []  - 0 Hypo / Hyperglycemic Management (close monitor of Blood Glucose) []  - 0 Ankle / Brachial Index (ABI) - do not check if billed separately X- 1 5 Vital Signs Imm, Amaree C. ([id number]) Has the patient been seen at the hospital within the last three years: Yes Total Score: 85 Level Of Care: New/Established - Level 3 Electronic Signature(s) Signed: 11/05/2018 4:11:30 PM By: Harold Barban Entered By: Harold Barban on 11/05/2018 15:03:41 Branaman, [name] ([id number]) -------------------------------------------------------------------------------- Encounter Discharge Information Details Patient Name: [name], [name] Date of Service: 11/05/2018 2:00 PM Medical Record Number: [id number] Patient Account Number: [id number] Date of Birth/Sex: [birth year] (85 y.o. F) Treating RN: Army Melia Primary Care Mahaila Tischer: Ria Bush Other Clinician: Referring Ameenah Prosser: Ria Bush Treating  Crichlow/Extender: Melburn Hake, HOYT Weeks in Treatment:  30 Encounter Discharge Information Items Discharge Condition: Stable Ambulatory Status: Walker Discharge Destination: Home Transportation: Other Accompanied By: self Schedule Follow-up Appointment: Yes Clinical Summary of Care: Electronic Signature(s) Signed: 11/05/2018 4:00:40 PM By: Army Melia Entered By: Army Melia on 11/05/2018 15:10:25 Vawter, [name] ([id number]) -------------------------------------------------------------------------------- Lower Extremity Assessment Details Patient Name: Goltz, Valeta C. Date of Service: 11/05/2018 2:00 PM Medical Record Number: [id number] Patient Account Number: [id number] Date of Birth/Sex: 07/12/1927 (85 y.o. F) Treating RN: Montey Hora Primary Care Aquarius Latouche: Ria Bush Other Clinician: Referring Shann Merrick: Ria Bush Treating Zamaria Brazzle/Extender: STONE III, HOYT Weeks in Treatment: 32 Edema Assessment Assessed: [Left: No] [Right: No] Edema: [Left: N] [Right: o] Vascular Assessment Pulses: Dorsalis Pedis Palpable: [Right:Yes] Electronic Signature(s) Signed: 11/05/2018 3:29:41 PM By: Montey Hora Entered By: Montey Hora on 11/05/2018 14:18:59 Carboni, Jeraline C. ([id number]) -------------------------------------------------------------------------------- Multi Wound Chart Details Patient Name: Macon, Arlynn C. Date of Service: 11/05/2018 2:00 PM Medical Record Number: [id number] Patient Account Number: [id number] Date of Birth/Sex: Aug 20, 1927 (85 y.o. F) Treating RN: Harold Barban Primary Care Yasira Engelson: Ria Bush Other Clinician: Referring Dallis Darden: Ria Bush Treating Oaklen Thiam/Extender: STONE III, HOYT Weeks in Treatment: 32 Vital Signs Height(in): 58 Pulse(bpm): 81 Weight(lbs): 140 Blood Pressure(mmHg): 134/98 Body Mass Index(BMI): 29 Temperature(F): 98.4 Respiratory Rate 16 (breaths/min): Photos: [N/A:N/A] Wound Location: Right Lower Leg - Lateral N/A N/A Wounding Event: Trauma  N/A N/A Primary Etiology: Diabetic Wound/Ulcer of the N/A N/A Lower Extremity Comorbid History: Cataracts, Anemia, N/A N/A Arrhythmia, Coronary Artery Disease, Hypertension, Myocardial Infarction, Type II Diabetes, Osteoarthritis Date Acquired: 03/09/2018 N/A N/A Weeks of Treatment: 32 N/A N/A Wound Status: Open N/A N/A Measurements L x W x D 0.1x0.1x0.1 N/A N/A (cm) Area (cm) : 0.008 N/A N/A Volume (cm) : 0.001 N/A N/A % Reduction in Area: 99.60% N/A N/A % Reduction in Volume: 99.80% N/A N/A Classification: Grade 2 N/A N/A Exudate Amount: None Present N/A N/A Wound Margin:  Flat and Intact N/A N/A Granulation Amount: None Present (0%) N/A N/A Necrotic Amount: Large (67-100%) N/A N/A Necrotic Tissue: Eschar N/A N/A Exposed Structures: Fat Layer (Subcutaneous N/A N/A Tissue) Exposed: Yes Fascia: No Tendon: No Muscle: No Minella, Ronique C. ([id number]) Joint: No Bone: No Epithelialization: Medium (34-66%) N/A N/A Treatment Notes Electronic Signature(s) Signed: 11/05/2018 4:11:30 PM By: Harold Barban Entered By: Harold Barban on 11/05/2018 15:00:21 Mateo, [name] ([id number]) -------------------------------------------------------------------------------- [place] Details Patient Name: Oppenheimer, Lavoris C. Date of Service: 11/05/2018 2:00 PM Medical Record Number: [id number] Patient Account Number: [id number] Date of Birth/Sex: 1928-02-10 (85 y.o. F) Treating RN: Harold Barban Primary Care Lalisa Kiehn: Ria Bush Other Clinician: Referring Cherylanne Ardelean: Ria Bush Treating Jahmeir Geisen/Extender: Melburn Hake, HOYT Weeks in Treatment: 64 Active Inactive Abuse / Safety / Falls / Self Care Management Nursing Diagnoses: Potential for falls Goals: Patient will not experience any injury related to falls Date Initiated: 03/23/2018 Target Resolution Date: 06/09/2018 Goal Status: Active Interventions: Assess fall risk on admission and as  needed Notes: Orientation to the Wound Care Program Nursing Diagnoses: Knowledge deficit related to the wound healing center program Goals: Patient/caregiver will verbalize understanding of the [facility] Date Initiated: 03/23/2018 Target Resolution Date: 06/09/2018 Goal Status: Active Interventions: Provide education on orientation to the wound center Notes: Pain, Acute or Chronic Nursing Diagnoses: Pain, acute or chronic: actual or potential Goals: Patient will verbalize adequate pain control and receive pain control interventions during procedures as needed Date Initiated: 03/23/2018 Target Resolution Date: 06/09/2018 Goal Status: Active Interventions: Assess comfort goal upon admission Bougher, Eldora C. ([id number]) Notes: Wound/Skin Impairment Nursing Diagnoses: Impaired tissue integrity Goals: Ulcer/skin breakdown will heal within 14 weeks Date Initiated: 03/23/2018 Target Resolution Date: 06/09/2018 Goal Status: Active Interventions: Assess patient/caregiver ability to obtain necessary supplies Assess patient/caregiver ability to perform ulcer/skin care regimen upon admission and as needed Assess ulceration(s) every visit Notes: Electronic Signature(s) Signed: 11/05/2018 4:11:30 PM By: Harold Barban Entered By: Harold Barban on 11/05/2018 15:02:38 Locurto, Chessica C. ([id number]) -------------------------------------------------------------------------------- Pain Assessment Details Patient Name: Hosley, Adara C. Date of Service: 11/05/2018 2:00 PM Medical Record Number: [id number] Patient Account Number: [id number] Date of Birth/Sex: 1928-02-23 (85 y.o. F) Treating RN: Harold Barban Primary Care Alvira Hecht: Ria Bush Other Clinician: Referring Aureliano Oshields: Ria Bush Treating Chezney Huether/Extender: Melburn Hake, HOYT Weeks in Treatment: 32 Active Problems Location of Pain Severity and Description of Pain Patient Has [name] Yes Site  Locations Character of Pain Describe the Pain: Throbbing Pain Management and Medication Current Pain Management: Electronic Signature(s) Signed: 11/05/2018 4:11:30 PM By: Harold Barban Signed: 11/05/2018 4:15:29 PM By: Lorine Bears RCP, RRT, CHT Entered By: Lorine Bears on 11/05/2018 13:48:55 Danzer, [name] ([id number]) -------------------------------------------------------------------------------- Patient/Caregiver Education Details Patient Name: Ramseyer, Lyzbeth C. Date of Service: 11/05/2018 2:00 PM Medical Record Number: [id number] Patient Account Number: [id number] Date of Birth/Gender: 13-Apr-1927 (85 y.o. F) Treating RN: Harold Barban Primary Care Physician: Ria Bush Other Clinician: Referring Physician: Ria Bush Treating Physician/Extender: Sharalyn Ink in Treatment: 30 Education Assessment Education Provided To: Patient Education Topics Provided Wound/Skin Impairment: Handouts: Caring for Your Ulcer Methods: Demonstration, Explain/Verbal Responses: State content correctly Electronic Signature(s) Signed: 11/05/2018 4:11:30 PM By: Harold Barban Entered By: Harold Barban on 11/05/2018 15:00:46 Nobrega, Lashya C. ([id number]) -------------------------------------------------------------------------------- Wound Assessment Details Patient Name: Locust, Marilla C. Date of Service: 11/05/2018 2:00 PM Medical Record Number: [id number] Patient Account Number: [id number] Date of Birth/Sex: 10-06-1927 (85 y.o. F) Treating RN: Montey Hora Primary Care Clayvon Parlett: Ria Bush Other Clinician:  Referring Juliett Eastburn: Ria Bush Treating Glennie Bose/Extender: STONE III, HOYT Weeks in Treatment: 32 Wound Status Wound Number: 8 Primary Diabetic Wound/Ulcer of the Lower Extremity Etiology: Wound Location: Right Lower Leg - Lateral Wound Open Wounding Event: Trauma Status: Date Acquired: 03/09/2018 Comorbid Cataracts,  Anemia, Arrhythmia, Coronary Artery Weeks Of Treatment: 32 History: Disease, Hypertension, Myocardial Infarction, Clustered Wound: No Type II Diabetes, Osteoarthritis Photos Wound Measurements Length: (cm) 0.1 % Reduction in Width: (cm) 0.1 % Reduction in Depth: (cm) 0.1 Epithelializat Area: (cm) 0.008 Tunneling: Volume: (cm) 0.001 Undermining: Area: 99.6% Volume: 99.8% ion: Medium (34-66%) No No Wound Description Classification: Grade 2 Foul Odor Afte Wound Margin: Flat and Intact Slough/Fibrino Exudate Amount: None Present r Cleansing: No No Wound Bed Granulation Amount: None Present (0%) Exposed Structure Necrotic Amount: Large (67-100%) Fascia Exposed: No Necrotic Quality: Eschar Fat Layer (Subcutaneous Tissue) Exposed: Yes Tendon Exposed: No Muscle Exposed: No Joint Exposed: No Bone Exposed: No Treatment Notes Wound #8 (Right, Lateral Lower Leg) Crombie, Makenah C. ([id number]) Notes xeroform, guaze, conform and patient's own compression Electronic Signature(s) Signed: 11/05/2018 3:29:41 PM By: Montey Hora Entered By: Montey Hora on 11/05/2018 14:18:42 Erbe, [name] ([id number]) -------------------------------------------------------------------------------- Vitals Details Patient Name: Nwosu, Mouna C. Date of Service: 11/05/2018 2:00 PM Medical Record Number: [id number] Patient Account Number: [id number] Date of Birth/Sex: [birth year] (85 y.o. F) Treating RN: Harold Barban Primary Care Kamica Florance: Ria Bush Other Clinician: Referring Nylen Creque: Ria Bush Treating Esthefany Herrig/Extender: STONE III, HOYT Weeks in Treatment: 32 Vital Signs Time Taken: 13:49 Temperature (F): 98.4 Height (in): 58 Pulse (bpm): 81 Weight (lbs): 140 Respiratory Rate (breaths/min): 16 Body Mass Index (BMI): 29.3 Blood Pressure (mmHg): 134/98 Reference Range: 80 - 120 mg / dl Electronic Signature(s) Signed: 11/05/2018 4:15:29 PM By: Lorine Bears RCP, RRT, CHT Entered By: Lorine Bears on 11/05/2018 13:49:27

## 2018-11-07 DIAGNOSIS — I10 Essential (primary) hypertension: Secondary | ICD-10-CM | POA: Diagnosis not present

## 2018-11-07 DIAGNOSIS — E119 Type 2 diabetes mellitus without complications: Secondary | ICD-10-CM | POA: Diagnosis not present

## 2018-11-07 DIAGNOSIS — L97812 Non-pressure chronic ulcer of other part of right lower leg with fat layer exposed: Secondary | ICD-10-CM | POA: Diagnosis not present

## 2018-11-07 DIAGNOSIS — L03115 Cellulitis of right lower limb: Secondary | ICD-10-CM | POA: Diagnosis not present

## 2018-11-07 DIAGNOSIS — I87331 Chronic venous hypertension (idiopathic) with ulcer and inflammation of right lower extremity: Secondary | ICD-10-CM | POA: Diagnosis not present

## 2018-11-07 DIAGNOSIS — I2510 Atherosclerotic heart disease of native coronary artery without angina pectoris: Secondary | ICD-10-CM | POA: Diagnosis not present

## 2018-11-10 DIAGNOSIS — L03115 Cellulitis of right lower limb: Secondary | ICD-10-CM | POA: Diagnosis not present

## 2018-11-10 DIAGNOSIS — I87331 Chronic venous hypertension (idiopathic) with ulcer and inflammation of right lower extremity: Secondary | ICD-10-CM | POA: Diagnosis not present

## 2018-11-10 DIAGNOSIS — L97812 Non-pressure chronic ulcer of other part of right lower leg with fat layer exposed: Secondary | ICD-10-CM | POA: Diagnosis not present

## 2018-11-10 DIAGNOSIS — I10 Essential (primary) hypertension: Secondary | ICD-10-CM | POA: Diagnosis not present

## 2018-11-10 DIAGNOSIS — E119 Type 2 diabetes mellitus without complications: Secondary | ICD-10-CM | POA: Diagnosis not present

## 2018-11-10 DIAGNOSIS — I2510 Atherosclerotic heart disease of native coronary artery without angina pectoris: Secondary | ICD-10-CM | POA: Diagnosis not present

## 2018-11-11 DIAGNOSIS — Z7984 Long term (current) use of oral hypoglycemic drugs: Secondary | ICD-10-CM | POA: Diagnosis not present

## 2018-11-11 DIAGNOSIS — E119 Type 2 diabetes mellitus without complications: Secondary | ICD-10-CM | POA: Diagnosis not present

## 2018-11-11 DIAGNOSIS — I87331 Chronic venous hypertension (idiopathic) with ulcer and inflammation of right lower extremity: Secondary | ICD-10-CM | POA: Diagnosis not present

## 2018-11-11 DIAGNOSIS — L97812 Non-pressure chronic ulcer of other part of right lower leg with fat layer exposed: Secondary | ICD-10-CM | POA: Diagnosis not present

## 2018-11-11 DIAGNOSIS — D649 Anemia, unspecified: Secondary | ICD-10-CM | POA: Diagnosis not present

## 2018-11-11 DIAGNOSIS — I2510 Atherosclerotic heart disease of native coronary artery without angina pectoris: Secondary | ICD-10-CM | POA: Diagnosis not present

## 2018-11-11 DIAGNOSIS — M1990 Unspecified osteoarthritis, unspecified site: Secondary | ICD-10-CM | POA: Diagnosis not present

## 2018-11-11 DIAGNOSIS — I10 Essential (primary) hypertension: Secondary | ICD-10-CM | POA: Diagnosis not present

## 2018-11-12 ENCOUNTER — Other Ambulatory Visit: Payer: Self-pay

## 2018-11-12 ENCOUNTER — Encounter: Payer: Medicare Other | Admitting: Physician Assistant

## 2018-11-12 DIAGNOSIS — L97812 Non-pressure chronic ulcer of other part of right lower leg with fat layer exposed: Secondary | ICD-10-CM | POA: Diagnosis not present

## 2018-11-12 DIAGNOSIS — L03115 Cellulitis of right lower limb: Secondary | ICD-10-CM | POA: Diagnosis not present

## 2018-11-12 DIAGNOSIS — M1990 Unspecified osteoarthritis, unspecified site: Secondary | ICD-10-CM | POA: Diagnosis not present

## 2018-11-12 DIAGNOSIS — I87331 Chronic venous hypertension (idiopathic) with ulcer and inflammation of right lower extremity: Secondary | ICD-10-CM | POA: Diagnosis not present

## 2018-11-12 DIAGNOSIS — I2510 Atherosclerotic heart disease of native coronary artery without angina pectoris: Secondary | ICD-10-CM | POA: Diagnosis not present

## 2018-11-12 DIAGNOSIS — E11622 Type 2 diabetes mellitus with other skin ulcer: Secondary | ICD-10-CM | POA: Diagnosis not present

## 2018-11-12 NOTE — Progress Notes (Signed)
MERRILY, TEGELER ([id number]) Visit Report for 11/12/2018 Arrival Information Details Patient Name: [name], [name] Date of Service: 11/12/2018 2:45 PM Medical Record Number: [id number] Patient Account Number: [id number] Date of Birth/Sex: 09/27/1927 (85 y.o. F) Treating RN: Army Melia Primary Care Yonael Tulloch: Ria Bush Other Clinician: Referring Zaray Gatchel: Ria Bush Treating Kemarion Abbey/Extender: Melburn Hake, HOYT Weeks in Treatment: 67 Visit Information History Since Last Visit Added or deleted any medications: No Patient Arrived: [place] Any new allergies or adverse reactions: No Arrival Time: 14:43 Had a fall or experienced change in No Accompanied By: self activities of daily living that may affect Transfer Assistance: None risk of falls: Signs or symptoms of abuse/neglect since last visito No Hospitalized since last visit: No Has Dressing in Place as Prescribed: Yes Pain Present Now: No Electronic Signature(s) Signed: 11/12/2018 4:13:40 PM By: Army Melia Entered By: Army Melia on 11/12/2018 14:43:15 Froelich, [name] ([id number]) -------------------------------------------------------------------------------- Clinic Level of Care Assessment Details Patient Name: [name], [name] Date of Service: 11/12/2018 2:45 PM Medical Record Number: [id number] Patient Account Number: [id number] Date of Birth/Sex: 07-14-1927 (85 y.o. F) Treating RN: Harold Barban Primary Care Therman Hughlett: Ria Bush Other Clinician: Referring Maire Govan: Ria Bush Treating Ridgely Anastacio/Extender: Melburn Hake, HOYT Weeks in Treatment: 33 Clinic Level of Care Assessment Items TOOL 4 Quantity Score []  - Use when only an EandM is performed on FOLLOW-UP visit 0 ASSESSMENTS - Nursing Assessment / Reassessment X - Reassessment of Co-morbidities (includes updates in patient status) 1 10 X- 1 5 Reassessment of Adherence to Treatment Plan ASSESSMENTS - Wound and Skin Assessment /  Reassessment X - Simple Wound Assessment / Reassessment - one wound 1 5 []  - 0 Complex Wound Assessment / Reassessment - multiple wounds []  - 0 Dermatologic / Skin Assessment (not related to wound area) ASSESSMENTS - Focused Assessment []  - Circumferential Edema Measurements - multi extremities 0 []  - 0 Nutritional Assessment / Counseling / Intervention []  - 0 Lower Extremity Assessment (monofilament, tuning fork, pulses) []  - 0 Peripheral Arterial Disease Assessment (using hand held doppler) ASSESSMENTS - Ostomy and/or Continence Assessment and Care []  - Incontinence Assessment and Management 0 []  - 0 Ostomy Care Assessment and Management (repouching, etc.) PROCESS - Coordination of Care X - Simple Patient / Family Education for ongoing care 1 15 []  - 0 Complex (extensive) Patient / Family Education for ongoing care []  - 0 Staff obtains Programmer, systems, Records, Test Results / Process Orders X- 1 10 Staff telephones HHA, Nursing Homes / Clarify orders / etc []  - 0 Routine Transfer to another Facility (non-emergent condition) []  - 0 Routine Hospital Admission (non-emergent condition) []  - 0 New Admissions / Biomedical engineer / Ordering NPWT, Apligraf, etc. []  - 0 Emergency Hospital Admission (emergent condition) X- 1 10 Simple Discharge Coordination [place], Riannah C. ([id number]) []  - 0 Complex (extensive) Discharge Coordination PROCESS - Special Needs []  - Pediatric / Minor Patient Management 0 []  - 0 Isolation Patient Management []  - 0 Hearing / Language / Visual special needs []  - 0 Assessment of Community assistance (transportation, D/C planning, etc.) []  - 0 Additional assistance / Altered mentation []  - 0 Support Surface(s) Assessment (bed, cushion, seat, etc.) INTERVENTIONS - Wound Cleansing / Measurement X - Simple Wound Cleansing - one wound 1 5 []  - 0 Complex Wound Cleansing - multiple wounds X- 1 5 Wound Imaging (photographs - any number of  wounds) []  - 0 Wound Tracing (instead of photographs) X- 1 5 Simple Wound Measurement - one wound []  - 0 Complex Wound  Measurement - multiple wounds INTERVENTIONS - Wound Dressings X - Small Wound Dressing one or multiple wounds 1 10 []  - 0 Medium Wound Dressing one or multiple wounds []  - 0 Large Wound Dressing one or multiple wounds []  - 0 Application of Medications - topical []  - 0 Application of Medications - injection INTERVENTIONS - Miscellaneous []  - External ear exam 0 []  - 0 Specimen Collection (cultures, biopsies, blood, body fluids, etc.) []  - 0 Specimen(s) / Culture(s) sent or taken to Lab for analysis []  - 0 Patient Transfer (multiple staff / Civil Service fast streamer / Similar devices) []  - 0 Simple Staple / Suture removal (25 or less) []  - 0 Complex Staple / Suture removal (26 or more) []  - 0 Hypo / Hyperglycemic Management (close monitor of Blood Glucose) []  - 0 Ankle / Brachial Index (ABI) - do not check if billed separately X- 1 5 Vital Signs Druck, Brendi C. ([id number]) Has the patient been seen at the hospital within the last three years: Yes Total Score: 85 Level Of Care: New/Established - Level 3 Electronic Signature(s) Signed: 11/12/2018 4:21:56 PM By: Harold Barban Entered By: Harold Barban on 11/12/2018 15:14:16 Petroni, [name] ([id number]) -------------------------------------------------------------------------------- Encounter Discharge Information Details Patient Name: [name], [name] Date of Service: 11/12/2018 2:45 PM Medical Record Number: [id number] Patient Account Number: [id number] Date of Birth/Sex: January 01, 1928 (85 y.o. F) Treating RN: Harold Barban Primary Care Odus Clasby: Ria Bush Other Clinician: Referring Katrese Shell: Ria Bush Treating Fantasia Jinkins/Extender: Melburn Hake, HOYT Weeks in Treatment: 13 Encounter Discharge Information Items Discharge Condition: Stable Ambulatory Status: Walker Discharge Destination:  Home Transportation: Private Auto Accompanied By: self Schedule Follow-up Appointment: Yes Clinical Summary of Care: Electronic Signature(s) Signed: 11/12/2018 4:21:56 PM By: Harold Barban Entered By: Harold Barban on 11/12/2018 15:17:21 Vereen, [name] ([id number]) -------------------------------------------------------------------------------- Lower Extremity Assessment Details Patient Name: Fickling, Xiomara C. Date of Service: 11/12/2018 2:45 PM Medical Record Number: [id number] Patient Account Number: [id number] Date of Birth/Sex: [birth year] (85 y.o. F) Treating RN: Army Melia Primary Care Edrian Melucci: Ria Bush Other Clinician: Referring Tanasia Budzinski: Ria Bush Treating Tonie Vizcarrondo/Extender: STONE III, HOYT Weeks in Treatment: 33 Edema Assessment Assessed: [Left: No] [Right: No] Edema: [Left: N] [Right: o] Vascular Assessment Pulses: Dorsalis Pedis Palpable: [Right:Yes] Electronic Signature(s) Signed: 11/12/2018 4:13:40 PM By: Army Melia Entered By: Army Melia on 11/12/2018 14:48:34 Winterrowd, Kamylah C. ([id number]) -------------------------------------------------------------------------------- Multi Wound Chart Details Patient Name: Lynch, Maripat C. Date of Service: 11/12/2018 2:45 PM Medical Record Number: [id number] Patient Account Number: [id number] Date of Birth/Sex: [birth year] (85 y.o. F) Treating RN: Harold Barban Primary Care Angelika Jerrett: Ria Bush Other Clinician: Referring Aydrien Froman: Ria Bush Treating Asante Ritacco/Extender: STONE III, HOYT Weeks in Treatment: 33 Vital Signs Height(in): 58 Pulse(bpm): 79 Weight(lbs): 140 Blood Pressure(mmHg): 123/79 Body Mass Index(BMI): 29 Temperature(F): 98.5 Respiratory Rate 16 (breaths/min): Photos: [N/A:N/A] Wound Location: Right Lower Leg - Lateral N/A N/A Wounding Event: Trauma N/A N/A Primary Etiology: Diabetic Wound/Ulcer of the N/A N/A Lower Extremity Comorbid History: Cataracts,  Anemia, N/A N/A Arrhythmia, Coronary Artery Disease, Hypertension, Myocardial Infarction, Type II Diabetes, Osteoarthritis Date Acquired: 03/09/2018 N/A N/A Weeks of Treatment: 33 N/A N/A Wound Status: Open N/A N/A Measurements L x W x D 0.1x0.1x0.1 N/A N/A (cm) Area (cm) : 0.008 N/A N/A Volume (cm) : 0.001 N/A N/A % Reduction in Area: 99.60% N/A N/A % Reduction in Volume: 99.80% N/A N/A Classification: Grade 2 N/A N/A Exudate Amount: None Present N/A N/A Wound Margin: Flat and Intact N/A N/A Granulation Amount: None Present (0%) N/A N/A Necrotic Amount: Small (  1-33%) N/A N/A Necrotic Tissue: Eschar N/A N/A Exposed Structures: Fat Layer (Subcutaneous N/A N/A Tissue) Exposed: Yes Fascia: No Tendon: No Muscle: No Seltzer, Angelys C. ([id number]) Joint: No Bone: No Epithelialization: Large (67-100%) N/A N/A Treatment Notes Electronic Signature(s) Signed: 11/12/2018 4:21:56 PM By: Harold Barban Entered By: Harold Barban on 11/12/2018 15:12:28 Tetzlaff, [name] ([id number]) -------------------------------------------------------------------------------- [place] Details Patient Name: Malay, Tasmia C. Date of Service: 11/12/2018 2:45 PM Medical Record Number: [id number] Patient Account Number: [id number] Date of Birth/Sex: 12-22-1927 (85 y.o. F) Treating RN: Harold Barban Primary Care Ozzie Knobel: Ria Bush Other Clinician: Referring Dempsey Knotek: Ria Bush Treating Fanny Agan/Extender: Melburn Hake, HOYT Weeks in Treatment: 33 Active Inactive Electronic Signature(s) Signed: 11/12/2018 4:21:56 PM By: Harold Barban Entered By: Harold Barban on 11/12/2018 15:18:00 Mexicano, Vega C. ([id number]) -------------------------------------------------------------------------------- Pain Assessment Details Patient Name: Flinn, Mikisha C. Date of Service: 11/12/2018 2:45 PM Medical Record Number: [id number] Patient Account Number: [id number] Date of  Birth/Sex: 1927-11-15 (85 y.o. F) Treating RN: Army Melia Primary Care Naje Rice: Ria Bush Other Clinician: Referring Romeo Zielinski: Ria Bush Treating Toriano Aikey/Extender: Melburn Hake, HOYT Weeks in Treatment: 33 Active Problems Location of Pain Severity and Description of Pain Patient Has [name] No Site Locations Pain Management and Medication Current Pain Management: Electronic Signature(s) Signed: 11/12/2018 4:13:40 PM By: Army Melia Entered By: Army Melia on 11/12/2018 14:43:20 Schulte, [name] ([id number]) -------------------------------------------------------------------------------- Patient/Caregiver Education Details Patient Name: Defilippo, Haily C. Date of Service: 11/12/2018 2:45 PM Medical Record Number: [id number] Patient Account Number: [id number] Date of Birth/Gender: [birth year] (85 y.o. F) Treating RN: Harold Barban Primary Care Physician: Ria Bush Other Clinician: Referring Physician: Ria Bush Treating Physician/Extender: Sharalyn Ink in Treatment: 85 Education Assessment Education Provided To: Patient Education Topics Provided Offloading: Handouts: What is Offloadingo Methods: Demonstration, Explain/Verbal Responses: State content correctly Electronic Signature(s) Signed: 11/12/2018 4:21:56 PM By: Harold Barban Entered By: Harold Barban on 11/12/2018 15:13:38 Townley, Cherylee C. ([id number]) -------------------------------------------------------------------------------- Wound Assessment Details Patient Name: Iwanicki, Aloni C. Date of Service: 11/12/2018 2:45 PM Medical Record Number: [id number] Patient Account Number: [id number] Date of Birth/Sex: 09-27-1927 (85 y.o. F) Treating RN: Harold Barban Primary Care Hosey Burmester: Ria Bush Other Clinician: Referring Wyat Infinger: Ria Bush Treating Shayanne Gomm/Extender: STONE III, HOYT Weeks in Treatment: 33 Wound Status Wound Number: 8 Primary Diabetic  Wound/Ulcer of the Lower Extremity Etiology: Wound Location: Right, Lateral Lower Leg Wound Healed - Epithelialized Wounding Event: Trauma Status: Date Acquired: 03/09/2018 Comorbid Cataracts, Anemia, Arrhythmia, Coronary Artery Weeks Of Treatment: 33 History: Disease, Hypertension, Myocardial Infarction, Clustered Wound: No Type II Diabetes, Osteoarthritis Photos Wound Measurements Length: (cm) 0 % Reduct Width: (cm) 0 % Reduct Depth: (cm) 0 Epitheli Area: (cm) 0 Tunneli Volume: (cm) 0 Undermi ion in Area: 100% ion in Volume: 100% alization: Large (67-100%) ng: No ning: No Wound Description Classification: Grade 2 Foul Odo Wound Margin: Flat and Intact Slough/F Exudate Amount: None Present r After Cleansing: No ibrino No Wound Bed Granulation Amount: None Present (0%) Exposed Structure Necrotic Amount: Small (1-33%) Fascia Exposed: No Necrotic Quality: Eschar Fat Layer (Subcutaneous Tissue) Exposed: Yes Tendon Exposed: No Muscle Exposed: No Joint Exposed: No Bone Exposed: No Electronic Signature(s) Signed: 11/12/2018 4:21:56 PM By: Juliane Lack ([id number]) Entered By: Harold Barban on 11/12/2018 15:12:50 Mccants, [name] ([id number]) -------------------------------------------------------------------------------- Vitals Details Patient Name: Thobe, Temiloluwa C. Date of Service: 11/12/2018 2:45 PM Medical Record Number: [id number] Patient Account Number: [id number] Date of Birth/Sex: [birth year] (85 y.o. F) Treating RN: Army Melia Primary Care Darshana Curnutt: Ria Bush Other Clinician:  Referring Wreatha Sturgeon: Ria Bush Treating Draken Farrior/Extender: STONE III, HOYT Weeks in Treatment: 33 Vital Signs Time Taken: 14:43 Temperature (F): 98.5 Height (in): 58 Pulse (bpm): 79 Weight (lbs): 140 Respiratory Rate (breaths/min): 16 Body Mass Index (BMI): 29.3 Blood Pressure (mmHg): 123/79 Reference Range: 80 - 120 mg / dl Electronic  Signature(s) Signed: 11/12/2018 4:13:40 PM By: Army Melia Entered By: Army Melia on 11/12/2018 14:44:39

## 2018-11-12 NOTE — Progress Notes (Signed)
GODDESS, GEBBIA ([id number]) Visit Report for 11/12/2018 Chief Complaint Document Details Patient Name: [name], [name] Date of Service: 11/12/2018 2:45 PM Medical Record Number: [id number] Patient Account Number: [id number] Date of Birth/Sex: May 20, 1927 (85 y.o. F) Treating RN: Harold Barban Primary Care Provider: Ria Bush Other Clinician: Referring Provider: Ria Bush Treating Provider/Extender: Melburn Hake, HOYT Weeks in Treatment: 80 Information Obtained from: Patient Chief Complaint RLE wounds Electronic Signature(s) Signed: 11/12/2018 3:06:48 PM By: Worthy Keeler PA-C Entered By: Worthy Keeler on 11/12/2018 15:06:48 Ruotolo, [name] ([id number]) -------------------------------------------------------------------------------- Physician Orders Details Patient Name: [name], [name] Date of Service: 11/12/2018 2:45 PM Medical Record Number: [id number] Patient Account Number: [id number] Date of Birth/Sex: 01-11-1928 (85 y.o. F) Treating RN: Harold Barban Primary Care Provider: Ria Bush Other Clinician: Referring Provider: Ria Bush Treating Provider/Extender: Melburn Hake, HOYT Weeks in Treatment: 62 Verbal / Phone Orders: No Diagnosis Coding ICD-10 Coding Code Description I87.331 Chronic venous hypertension (idiopathic) with ulcer and inflammation of right lower extremity E11.622 Type 2 diabetes mellitus with other skin ulcer L97.812 Non-pressure chronic ulcer of other part of right lower leg with fat layer exposed L03.115 Cellulitis of right lower limb Home Health o D/C [place] Discharge From [facility] o Discharge from [name] to wear compression stocking to help with swelling. Call with any questions. Electronic Signature(s) Signed: 11/12/2018 4:21:56 PM By: Harold Barban Entered By: Harold Barban on 11/12/2018 15:16:24 Narine, Stephnie Loletha Grayer  ([id number]) -------------------------------------------------------------------------------- Problem List Details Patient Name: [name], [name] Date of Service: 11/12/2018 2:45 PM Medical Record Number: [id number] Patient Account Number: [id number] Date of Birth/Sex: Jan 24, 1928 (85 y.o. F) Treating RN: Harold Barban Primary Care Provider: Ria Bush Other Clinician: Referring Provider: Ria Bush Treating Provider/Extender: Melburn Hake, HOYT Weeks in Treatment: 33 Active Problems ICD-10 Evaluated Encounter Code Description Active Date Today Diagnosis I87.331 Chronic venous hypertension (idiopathic) with ulcer and 03/23/2018 No Yes inflammation of right lower extremity E11.622 Type 2 diabetes mellitus with other skin ulcer 03/23/2018 No Yes L97.812 Non-pressure chronic ulcer of other part of right lower leg 03/23/2018 No Yes with fat layer exposed L03.115 Cellulitis of right lower limb 03/23/2018 No Yes Inactive Problems Resolved Problems Electronic Signature(s) Signed: 11/12/2018 3:06:41 PM By: Worthy Keeler PA-C Entered By: Worthy Keeler on 11/12/2018 15:06:40

## 2018-11-14 ENCOUNTER — Ambulatory Visit (INDEPENDENT_AMBULATORY_CARE_PROVIDER_SITE_OTHER): Payer: Medicare Other | Admitting: Family Medicine

## 2018-11-14 ENCOUNTER — Encounter: Payer: Self-pay | Admitting: Family Medicine

## 2018-11-14 ENCOUNTER — Other Ambulatory Visit: Payer: Self-pay

## 2018-11-14 VITALS — BP 134/70 | HR 76 | Temp 98.7°F | Ht <= 58 in | Wt 138.4 lb

## 2018-11-14 DIAGNOSIS — I7025 Atherosclerosis of native arteries of other extremities with ulceration: Secondary | ICD-10-CM | POA: Diagnosis not present

## 2018-11-14 DIAGNOSIS — M79604 Pain in right leg: Secondary | ICD-10-CM | POA: Diagnosis not present

## 2018-11-14 DIAGNOSIS — G8929 Other chronic pain: Secondary | ICD-10-CM

## 2018-11-14 DIAGNOSIS — M79605 Pain in left leg: Secondary | ICD-10-CM | POA: Diagnosis not present

## 2018-11-14 DIAGNOSIS — I872 Venous insufficiency (chronic) (peripheral): Secondary | ICD-10-CM | POA: Diagnosis not present

## 2018-11-14 MED ORDER — GABAPENTIN 100 MG PO CAPS: 100.0000 mg | ORAL_CAPSULE | Freq: Every day | ORAL | 3 refills | Status: DC

## 2018-11-14 NOTE — Assessment & Plan Note (Signed)
CSRS reviewed  ?

## 2018-11-14 NOTE — Assessment & Plan Note (Addendum)
Chronic leg pain, doesn't quite describe neuropathy type symptoms - anticipate more from known chronic venous insufficiency and vascular congestion of LE. Supportive care reviewed - compression stockings, leg elevation, etc. Will trial gabapentin 100mg  nightly for possible neuropathy contribution. Continue voltaren gel.

## 2018-11-14 NOTE — Progress Notes (Signed)
This visit was conducted in person.  BP 134/70 (BP Location: Left Arm, Patient Position: Sitting, Cuff Size: Normal)   Pulse 76   Temp 98.7 F (37.1 C) (Temporal)   Ht 4\' 9"  (1.448 m)   Wt 138 lb 7 oz (62.8 kg)   SpO2 98%   BMI 29.96 kg/m    CC: check painful sore on leg Subjective:    [name], female    DOB: 05/20/1927, 85 y.o.   MRN: [id number]  HPI: [name] is a 85 y.o. female presenting on 11/14/2018 for Leg Pain (C/o are of wound on left leg is healing but pt states it still hurts.  Pt was told by wound care to see PCP due to nerve pain and neuropathy.)   Saw wound clinic earlier this week, discharged given wounds have fully healed. Wound clinic has recommended ongoing compression stocking use to prevent development of future wounds.  She did have vein procedure earlier this year.   Ongoing dull throbbing ache to BLE, seems to be localized to where she had wound. No burning pain, no numbness or tingling. Sometimes feels like electricity going down leg.   Sleeps with her foot elevated.   Known PAD with ulceration of R leg s/p procedure last year.  Notes some dyspnea with exertion.      Relevant past medical, surgical, family and social history reviewed and updated as indicated. Interim medical history since our last visit reviewed. Allergies and medications reviewed and updated. Outpatient Medications Prior to Visit  Medication Sig Dispense Refill  . acetaminophen (TYLENOL) 500 MG tablet Take 500 mg by mouth every 6 (six) hours as needed for mild pain.    Marland Kitchen ALPRAZolam (XANAX) 1 MG tablet Take 0.5 mg by mouth 4 (four) times daily as needed for anxiety.     Marland Kitchen aspirin EC 81 MG tablet Take 81 mg by mouth daily.    . collagenase (SANTYL) ointment Apply topically daily. 15 g 0  . diclofenac sodium (VOLTAREN) 1 % GEL Apply 1 application topically 3 (three) times daily. 1 Tube 1  . famotidine (PEPCID) 20 MG tablet Take 1 tablet (20 mg total) by  mouth at bedtime.    . furosemide (LASIX) 20 MG tablet TAKE 0.5 TABLETS (10 MG TOTAL) BY MOUTH DAILY. 45 tablet 3  . glucosamine-chondroitin 500-400 MG tablet Take 1 tablet by mouth daily.     Marland Kitchen glucose blood (ONE TOUCH ULTRA TEST) test strip USE AS INSTRUCTED TO CHECK BLOOD SUGAR ONCE A DAY Dx code: E11.9 50 each 5  . HYDROcodone-acetaminophen (NORCO/VICODIN) 5-325 MG tablet Take 0.5 tablets by mouth every 6 (six) hours as needed for moderate pain. 60 tablet 0  . loperamide (IMODIUM) 2 MG capsule Take 2 mg by mouth 4 (four) times daily as needed for diarrhea or loose stools.    Marland Kitchen loratadine (CLARITIN) 10 MG tablet Take 10 mg by mouth daily as needed for allergies.    . metoprolol tartrate (LOPRESSOR) 25 MG tablet TAKE ONE AND HALF TABLETS (37.5 MG TOTAL) BY MOUTH 2 (TWO) TIMES DAILY. 270 tablet 1  . Multiple Vitamin (MULTIVITAMIN) tablet Take 1 tablet by mouth daily. Patient reports takes a new vitamin to replace her centrum as recommended by her eye doctor.    . nitroGLYCERIN (NITROSTAT) 0.4 MG SL tablet Place 1 tablet (0.4 mg total) under the tongue every 5 (five) minutes as needed for chest pain. 25 tablet 3  . nystatin cream (MYCOSTATIN) Apply 1  application topically 2 (two) times daily. 45 g 0  . nystatin-triamcinolone ointment (MYCOLOG) APPLY 1 APPLICATION TOPICALLY 2 (TWO) TIMES DAILY. AS NEEDED FOR ITCHING 60 g 1  . ONETOUCH VERIO test strip USE TO TEST BLOOD SUGAR ONCE DAILY 100 each 6  . pantoprazole (PROTONIX) 40 MG tablet TAKE 1 TABLET BY MOUTH EVERY DAY 30 tablet 3  . pioglitazone (ACTOS) 30 MG tablet TAKE 1 TABLET BY MOUTH EVERY DAY 90 tablet 3  . polyethylene glycol powder (GLYCOLAX/MIRALAX) powder Take 17 g by mouth daily. Hold for diarrhea 3350 g 1  . pravastatin (PRAVACHOL) 20 MG tablet TAKE 1 TABLET BY MOUTH EVERY DAY 90 tablet 3  . Probiotic Product (ALIGN) 4 MG CAPS Take 1 capsule (4 mg total) by mouth daily.    . ramipril (ALTACE) 5 MG capsule TAKE ONE CAPSULE BY MOUTH  EVERYDAY FOR BLOOD PRESSURE 90 capsule 1  . senna-docusate (SENOKOT-S) 8.6-50 MG tablet Take 1 tablet by mouth daily as needed for moderate constipation. 30 tablet 0  . sitaGLIPtin (JANUVIA) 100 MG tablet Take 1 tablet (100 mg total) by mouth daily. (Patient taking differently: Take 100 mg by mouth daily. Takes 1/2 tablet) 30 tablet 2  . traZODone (DESYREL) 150 MG tablet Take 150 mg by mouth at bedtime.     . triamcinolone cream (KENALOG) 0.1 % APPLY TO AFFECTED AREA TWICE A DAY 80 g 0  . vitamin B-12 (CYANOCOBALAMIN) 1000 MCG tablet Take 1,000 mcg by mouth daily.    . vitamin E 400 UNIT capsule Take 400 Units by mouth daily.     No facility-administered medications prior to visit.      Per HPI unless specifically indicated in ROS section below Review of Systems Objective:    BP 134/70 (BP Location: Left Arm, Patient Position: Sitting, Cuff Size: Normal)   Pulse 76   Temp 98.7 F (37.1 C) (Temporal)   Ht 4\' 9"  (1.448 m)   Wt 138 lb 7 oz (62.8 kg)   SpO2 98%   BMI 29.96 kg/m   Wt Readings from Last 3 Encounters:  11/14/18 138 lb 7 oz (62.8 kg)  10/09/18 139 lb (63 kg)  09/06/18 140 lb (63.5 kg)    Physical Exam Vitals signs and nursing note reviewed.  Constitutional:      Appearance: Normal appearance. She is not ill-appearing.  HENT:     Head: Normocephalic and atraumatic.     Mouth/Throat:     Mouth: Mucous membranes are moist.     Pharynx: No posterior oropharyngeal erythema.  Cardiovascular:     Rate and Rhythm: Normal rate and regular rhythm.     Pulses: Normal pulses.     Heart sounds: Normal heart sounds. No murmur.  Pulmonary:     Effort: Pulmonary effort is normal. No respiratory distress.     Breath sounds: Normal breath sounds. No wheezing, rhonchi or rales.  Musculoskeletal:        General: No swelling.     Right lower leg: No edema.     Left lower leg: No edema.     Comments:  Fully healed BLE Compression stockings in place Evident varicose veins  throughout lower extremities  Skin:    General: Skin is warm and dry.     Findings: No erythema or rash.  Neurological:     Mental Status: She is alert.  Psychiatric:        Mood and Affect: Mood is anxious.        Speech: Speech  normal.        Behavior: Behavior normal.       Results for orders placed or performed during the hospital encounter of 09/06/18  Glucose, capillary  Result Value Ref Range   Glucose-Capillary 107 (H) 70 - 99 mg/dL  Glucose, capillary  Result Value Ref Range   Glucose-Capillary 127 (H) 70 - 99 mg/dL   Assessment & Plan:   Problem List Items Addressed This Visit    Encounter for chronic pain management    [place] CSRS reviewed.       Chronic venous insufficiency   Chronic leg pain - Primary    Chronic leg pain, doesn't quite describe neuropathy type symptoms - anticipate more from known chronic venous insufficiency and vascular congestion of LE. Supportive care reviewed - compression stockings, leg elevation, etc. Will trial gabapentin 100mg  nightly for possible neuropathy contribution. Continue voltaren gel.       Relevant Medications   gabapentin (NEURONTIN) 100 MG capsule       Meds ordered this encounter  Medications  . gabapentin (NEURONTIN) 100 MG capsule    Sig: Take 1 capsule (100 mg total) by mouth at bedtime.    Dispense:  30 capsule    Refill:  3   No orders of the defined types were placed in this encounter.   Follow up plan: No follow-ups on file.  Ria Bush, MD

## 2018-11-14 NOTE — Patient Instructions (Addendum)
I think leg pain is coming from chronic vein disease. Continue compression stockings and leg elevation as much as able.  Less likely neuropathy (nerve pain) - ok to try gabapentin 100mg  one at night to see if it will help sleep and leg pain. It can make you sleepy.

## 2018-11-17 DIAGNOSIS — I10 Essential (primary) hypertension: Secondary | ICD-10-CM | POA: Diagnosis not present

## 2018-11-17 DIAGNOSIS — L97812 Non-pressure chronic ulcer of other part of right lower leg with fat layer exposed: Secondary | ICD-10-CM | POA: Diagnosis not present

## 2018-11-17 DIAGNOSIS — E119 Type 2 diabetes mellitus without complications: Secondary | ICD-10-CM | POA: Diagnosis not present

## 2018-11-17 DIAGNOSIS — M1990 Unspecified osteoarthritis, unspecified site: Secondary | ICD-10-CM | POA: Diagnosis not present

## 2018-11-17 DIAGNOSIS — I2510 Atherosclerotic heart disease of native coronary artery without angina pectoris: Secondary | ICD-10-CM | POA: Diagnosis not present

## 2018-11-17 DIAGNOSIS — I87331 Chronic venous hypertension (idiopathic) with ulcer and inflammation of right lower extremity: Secondary | ICD-10-CM | POA: Diagnosis not present

## 2018-11-19 DIAGNOSIS — I87331 Chronic venous hypertension (idiopathic) with ulcer and inflammation of right lower extremity: Secondary | ICD-10-CM | POA: Diagnosis not present

## 2018-11-19 DIAGNOSIS — I10 Essential (primary) hypertension: Secondary | ICD-10-CM | POA: Diagnosis not present

## 2018-11-19 DIAGNOSIS — I2510 Atherosclerotic heart disease of native coronary artery without angina pectoris: Secondary | ICD-10-CM | POA: Diagnosis not present

## 2018-11-19 DIAGNOSIS — L97812 Non-pressure chronic ulcer of other part of right lower leg with fat layer exposed: Secondary | ICD-10-CM | POA: Diagnosis not present

## 2018-11-19 DIAGNOSIS — M1990 Unspecified osteoarthritis, unspecified site: Secondary | ICD-10-CM | POA: Diagnosis not present

## 2018-11-19 DIAGNOSIS — E119 Type 2 diabetes mellitus without complications: Secondary | ICD-10-CM | POA: Diagnosis not present

## 2018-11-24 DIAGNOSIS — L97812 Non-pressure chronic ulcer of other part of right lower leg with fat layer exposed: Secondary | ICD-10-CM | POA: Diagnosis not present

## 2018-11-24 DIAGNOSIS — I87331 Chronic venous hypertension (idiopathic) with ulcer and inflammation of right lower extremity: Secondary | ICD-10-CM | POA: Diagnosis not present

## 2018-11-24 DIAGNOSIS — M1990 Unspecified osteoarthritis, unspecified site: Secondary | ICD-10-CM | POA: Diagnosis not present

## 2018-11-24 DIAGNOSIS — E119 Type 2 diabetes mellitus without complications: Secondary | ICD-10-CM | POA: Diagnosis not present

## 2018-11-24 DIAGNOSIS — I2510 Atherosclerotic heart disease of native coronary artery without angina pectoris: Secondary | ICD-10-CM | POA: Diagnosis not present

## 2018-11-24 DIAGNOSIS — I10 Essential (primary) hypertension: Secondary | ICD-10-CM | POA: Diagnosis not present

## 2018-11-28 ENCOUNTER — Telehealth: Payer: Self-pay | Admitting: Cardiovascular Disease

## 2018-11-28 NOTE — Telephone Encounter (Signed)
Pt c/o Shortness Of Breath: STAT if SOB developed within the last 24 hours or pt is noticeably SOB on the phone  1. Are you currently SOB (can you hear that pt is SOB on the phone)? No   2. How long have you been experiencing SOB? A few weeks   3. Are you SOB when sitting or when up moving around? both  4. Are you currently experiencing any other symptoms? Patient stated she is experiencing a lot of stress at home, and when she does it causes for her heart to race.

## 2018-11-28 NOTE — Telephone Encounter (Signed)
Spoke with pt and pt c/o heart racing and stomach pain for 2 weeks Per pt" just know it is heart" Pt has [facility] info to obtain no vitals and is vague about symptoms Pt has appt with Dr Acie Fredrickson on 10/8 Attempted to see about making appt with APP but pt refuses to see an APP Will forward to Dr Acie Fredrickson for review .Adonis Housekeeper

## 2018-11-29 ENCOUNTER — Telehealth: Payer: Self-pay

## 2018-11-29 DIAGNOSIS — F331 Major depressive disorder, recurrent, moderate: Secondary | ICD-10-CM | POA: Diagnosis not present

## 2018-11-29 DIAGNOSIS — F411 Generalized anxiety disorder: Secondary | ICD-10-CM | POA: Diagnosis not present

## 2018-11-29 NOTE — Telephone Encounter (Signed)
Pt said for 2 wks has had irregular heart beat; now pt is worsening; still irregular heart beat; and heart is beating fast; pt does not know what heart rate is she just knows it is very fast.heart and abdomen are pounding; does not feel "right" in chest; can't get a good breath. Pt having fast and irreg heart beat, H/A,SOB dizziness weakness (pt is walking with walker because afraid she is going to fall.) pt has not received cb from cardiology. pts nerves are worse and has appt with psychiatrist today. Dr Danise Mina advised pt needs to be evaluated at [facility]. Pt said after psychiatry appt if still feeling bad or gets worse will go to UC or ED. FYI to Dr Danise Mina.

## 2018-11-29 NOTE — Telephone Encounter (Signed)
I spoke to [place] on the phone.  Her symptoms have been occurring for months. Palpitations. No CP or dyspnea.  We have rescheduled for Sept. 9. Ive asked her to call and to agree to see someone else if her symptoms worsen.

## 2018-11-30 NOTE — Progress Notes (Signed)
[name], [name] ([id number]) Visit Report for 07/23/2018 Arrival Information Details Patient Name: [name], [name] Date of Service: 07/23/2018 12:45 PM Medical Record Number: [id number] Patient Account Number: [id number] Date of Birth/Sex: 31-May-1927 (85 y.o. F) Treating RN: [name] Primary Care [name]: [name] Other Clinician: Referring Manika Hast: [name] Treating [name]/Extender: [name], [name] in Treatment: 20 Visit Information History Since Last Visit Added or deleted any medications: No Patient Arrived: Walker Any new allergies or adverse reactions: No Arrival Time: 12:47 Had a fall or experienced change in No Accompanied By: self activities of daily living that may affect Transfer Assistance: None risk of falls: Patient Identification Verified: Yes Signs or symptoms of abuse/neglect since last visito No Secondary Verification Process Completed: Yes Hospitalized since last visit: No Implantable device outside of the clinic excluding No cellular tissue based products placed in the center since last visit: Has Dressing in Place as Prescribed: Yes Pain Present Now: Yes Electronic Signature(s) Signed: 07/23/2018 2:49:09 PM By: [name] RCP, RRT, CHT Entered By: [name] on 07/23/2018 12:48:58 [name], [name] ([id number]) -------------------------------------------------------------------------------- Clinic Level of Care Assessment Details Patient Name: [name], [name] Date of Service: 07/23/2018 12:45 PM Medical Record Number: [id number] Patient Account Number: [id number] Date of Birth/Sex: [birth year] (85 y.o. F) Treating RN: [name] Primary Care [name]: [name] Other Clinician: Referring [name]: [name] Treating [name]/Extender: [name], [name] in Treatment: 17 Clinic Level of Care Assessment Items TOOL 4 Quantity Score []  - Use when only an EandM is  performed on FOLLOW-UP visit 0 ASSESSMENTS - Nursing Assessment / Reassessment X - Reassessment of Co-morbidities (includes updates in patient status) 1 10 X- 1 5 Reassessment of Adherence to Treatment Plan ASSESSMENTS - Wound and Skin Assessment / Reassessment X - Simple Wound Assessment / Reassessment - one wound 1 5 []  - 0 Complex Wound Assessment / Reassessment - multiple wounds []  - 0 Dermatologic / Skin Assessment (not related to wound area) ASSESSMENTS - Focused Assessment []  - Circumferential Edema Measurements - multi extremities 0 []  - 0 Nutritional Assessment / Counseling / Intervention []  - 0 Lower Extremity Assessment (monofilament, tuning fork, pulses) []  - 0 Peripheral Arterial Disease Assessment (using hand held doppler) ASSESSMENTS - Ostomy and/or Continence Assessment and Care []  - Incontinence Assessment and Management 0 []  - 0 Ostomy Care Assessment and Management (repouching, etc.) PROCESS - Coordination of Care []  - Simple Patient / Family Education for ongoing care 0 []  - 0 Complex (extensive) Patient / Family Education for ongoing care []  - 0 Staff obtains Programmer, systems, Records, Test Results / Process Orders []  - 0 Staff telephones HHA, Nursing Homes / Clarify orders / etc []  - 0 Routine Transfer to another Facility (non-emergent condition) []  - 0 Routine Hospital Admission (non-emergent condition) []  - 0 New Admissions / Biomedical engineer / Ordering NPWT, Apligraf, etc. []  - 0 Emergency Hospital Admission (emergent condition) X- 1 10 Simple Discharge Coordination [name], [name] ([id number]) []  - 0 Complex (extensive) Discharge Coordination PROCESS - Special Needs []  - Pediatric / Minor Patient Management 0 []  - 0 Isolation Patient Management []  - 0 Hearing / Language / Visual special needs []  - 0 Assessment of Community assistance (transportation, D/C planning, etc.) []  - 0 Additional assistance / Altered mentation []  - 0 Support  Surface(s) Assessment (bed, cushion, seat, etc.) INTERVENTIONS - Wound Cleansing / Measurement X - Simple Wound Cleansing - one wound 1 5 []  - 0 Complex Wound Cleansing - multiple wounds X-  1 5 Wound Imaging (photographs - any number of wounds) []  - 0 Wound Tracing (instead of photographs) X- 1 5 Simple Wound Measurement - one wound []  - 0 Complex Wound Measurement - multiple wounds INTERVENTIONS - Wound Dressings X - Small Wound Dressing one or multiple wounds 1 10 []  - 0 Medium Wound Dressing one or multiple wounds []  - 0 Large Wound Dressing one or multiple wounds []  - 0 Application of Medications - topical []  - 0 Application of Medications - injection INTERVENTIONS - Miscellaneous []  - External ear exam 0 []  - 0 Specimen Collection (cultures, biopsies, blood, body fluids, etc.) []  - 0 Specimen(s) / Culture(s) sent or taken to Lab for analysis []  - 0 Patient Transfer (multiple staff / Civil Service fast streamer / Similar devices) []  - 0 Simple Staple / Suture removal (25 or less) []  - 0 Complex Staple / Suture removal (26 or more) []  - 0 Hypo / Hyperglycemic Management (close monitor of Blood Glucose) []  - 0 Ankle / Brachial Index (ABI) - do not check if billed separately X- 1 5 Vital Signs [name], [name] ([id number]) Has the patient been seen at the hospital within the last three years: Yes Total Score: 60 Level Of Care: New/Established - Level 2 Electronic Signature(s) Signed: 11/30/2018 1:03:35 PM By: [name] Entered By: [name] on 07/23/2018 13:08:17 [name], [name] ([id number]) -------------------------------------------------------------------------------- Encounter Discharge Information Details Patient Name: [name], [name] Date of Service: 07/23/2018 12:45 PM Medical Record Number: [id number] Patient Account Number: [id number] Date of Birth/Sex: 11/30/1927 (85 y.o. F) Treating RN: [name] Primary Care Jude Naclerio: [name] Other  Clinician: Referring [name]: [name] Treating [name]/Extender: [name], [name] in Treatment: 17 Encounter Discharge Information Items Discharge Condition: Stable Ambulatory Status: Walker Discharge Destination: Home Transportation: Private Auto Accompanied By: self Schedule Follow-up Appointment: Yes Clinical Summary of Care: Electronic Signature(s) Signed: 07/24/2018 3:23:54 PM By: Gretta Cool, BSN, RN, CWS, Kim RN, BSN Entered By: Gretta Cool, BSN, RN, CWS, Kim on 07/23/2018 13:21:40 Fenstermacher, [name] ([id number]) -------------------------------------------------------------------------------- Lower Extremity Assessment Details Patient Name: [name], [name] Date of Service: 07/23/2018 12:45 PM Medical Record Number: [id number] Patient Account Number: [id number] Date of Birth/Sex: [birth year] (85 y.o. F) Treating RN: Montey Hora Primary Care Erielle Gawronski: [name] Other Clinician: Referring Blessed Cotham: [name] Treating Kiara Mcdowell/Extender: STONE III, [name] in Treatment: 17 Edema Assessment Assessed: [Left: No] [Right: No] Edema: [Left: Ye] [Right: s] Calf Left: Right: Point of Measurement: 30 cm From Medial Instep cm 28.8 cm Ankle Left: Right: Point of Measurement: 12 cm From Medial Instep cm 19 cm Vascular Assessment Pulses: Dorsalis Pedis Palpable: [Right:Yes] Electronic Signature(s) Signed: 07/24/2018 2:55:40 PM By: Montey Hora Entered By: Montey Hora on 07/23/2018 13:01:32 Gorka, Akiyah C. ([id number]) -------------------------------------------------------------------------------- Multi Wound Chart Details Patient Name: [name], [name] Date of Service: 07/23/2018 12:45 PM Medical Record Number: [id number] Patient Account Number: [id number] Date of Birth/Sex: 1928/02/13 (85 y.o. F) Treating RN: [name] Primary Care Lorrinda Ramstad: [name] Other Clinician: Referring Ariany Kesselman: [name] Treating  Levert Heslop/Extender: [name], [name] in Treatment: 17 Vital Signs Height(in): 58 Pulse(bpm): 59 Weight(lbs): 140 Blood Pressure(mmHg): 112/52 Body Mass Index(BMI): 29 Temperature(F): 97.9 Respiratory Rate 16 (breaths/min): Photos: [N/A:N/A] Wound Location: Right Lower Leg - Lateral N/A N/A Wounding Event: Trauma N/A N/A Primary Etiology: Diabetic Wound/Ulcer of the N/A N/A Lower Extremity Comorbid History: Cataracts, Anemia, N/A N/A Arrhythmia, Coronary Artery Disease, Hypertension, Myocardial Infarction, Type II Diabetes, Osteoarthritis Date Acquired: 03/09/2018 N/A N/A [name] of Treatment: 17 N/A N/A Wound  Status: Open N/A N/A Measurements L x W x D 2.9x1.2x0.1 N/A N/A (cm) Area (cm) : 2.733 N/A N/A Volume (cm) : 0.273 N/A N/A % Reduction in Area: -45.00% N/A N/A % Reduction in Volume: 51.70% N/A N/A Classification: Grade 2 N/A N/A Exudate Amount: Medium N/A N/A Exudate Type: Purulent N/A N/A Exudate Color: yellow, brown, green N/A N/A Wound Margin: Flat and Intact N/A N/A Granulation Amount: None Present (0%) N/A N/A Necrotic Amount: Large (67-100%) N/A N/A Exposed Structures: Fat Layer (Subcutaneous N/A N/A Tissue) Exposed: Yes Fascia: No Tendon: No Lorman, Shauntia C. ([id number]) Muscle: No Joint: No Bone: No Epithelialization: None N/A N/A Treatment Notes Electronic Signature(s) Signed: 11/30/2018 1:03:35 PM By: [name] Entered By: [name] on 07/23/2018 13:07:04 Deshong, [name] ([id number]) -------------------------------------------------------------------------------- [place] Details Patient Name: [name], [name] Date of Service: 07/23/2018 12:45 PM Medical Record Number: [id number] Patient Account Number: [id number] Date of Birth/Sex: 08/14/1927 (85 y.o. F) Treating RN: [name] Primary Care Sundus Pete: [name] Other Clinician: Referring Glora Hulgan: [name] Treating  Malessa Zartman/Extender: [name], [name] in Treatment: 17 Active Inactive Abuse / Safety / Falls / Self Care Management Nursing Diagnoses: Potential for falls Goals: Patient will not experience any injury related to falls Date Initiated: 03/23/2018 Target Resolution Date: 06/09/2018 Goal Status: Active Interventions: Assess fall risk on admission and as needed Notes: Orientation to the Wound Care Program Nursing Diagnoses: Knowledge deficit related to the wound healing center program Goals: Patient/caregiver will verbalize understanding of the [facility] Program Date Initiated: 03/23/2018 Target Resolution Date: 06/09/2018 Goal Status: Active Interventions: Provide education on orientation to the wound center Notes: Pain, Acute or Chronic Nursing Diagnoses: Pain, acute or chronic: actual or potential Goals: Patient will verbalize adequate pain control and receive pain control interventions during procedures as needed Date Initiated: 03/23/2018 Target Resolution Date: 06/09/2018 Goal Status: Active Interventions: Assess comfort goal upon admission Harbaugh, Tiah C. ([id number]) Notes: Wound/Skin Impairment Nursing Diagnoses: Impaired tissue integrity Goals: Ulcer/skin breakdown will heal within 14 [name] Date Initiated: 03/23/2018 Target Resolution Date: 06/09/2018 Goal Status: Active Interventions: Assess patient/caregiver ability to obtain necessary supplies Assess patient/caregiver ability to perform ulcer/skin care regimen upon admission and as needed Assess ulceration(s) every visit Notes: Electronic Signature(s) Signed: 11/30/2018 1:03:35 PM By: [name] Entered By: [name] on 07/23/2018 13:06:50 Mullenbach, Stanley C. ([id number]) -------------------------------------------------------------------------------- Pain Assessment Details Patient Name: [name], [name] Date of Service: 07/23/2018 12:45 PM Medical Record Number: [id number] Patient  Account Number: [id number] Date of Birth/Sex: Mar 02, 1928 (85 y.o. F) Treating RN: [name] Primary Care Zaakirah Kistner: [name] Other Clinician: Referring Caira Poche: [name] Treating Shonta Phillis/Extender: [name], [name] in Treatment: 17 Active Problems Location of Pain Severity and Description of Pain Patient Has [name] Yes Site Locations Rate the pain. Current Pain Level: 2 Pain Management and Medication Current Pain Management: Electronic Signature(s) Signed: 07/23/2018 2:49:09 PM By: [name] RCP, RRT, CHT Signed: 11/30/2018 1:03:35 PM By: [name] Entered By: [name] on 07/23/2018 12:49:20 Varady, [name] ([id number]) -------------------------------------------------------------------------------- Patient/Caregiver Education Details Patient Name: [name], [name] Date of Service: 07/23/2018 12:45 PM Medical Record Number: [id number] Patient Account Number: [id number] Date of Birth/Gender: [birth year] (85 y.o. F) Treating RN: [name] Primary Care Physician: [name] Other Clinician: Referring Physician: Ria [name] Treating Physician/Extender: Sharalyn Ink in Treatment: 17 Education Assessment Education Provided To: Patient Education Topics Provided Wound/Skin Impairment: Handouts: Caring for Your Ulcer Methods: Demonstration, Explain/Verbal Responses: State content correctly Electronic Signature(s) Signed:  11/30/2018 1:03:35 PM By: [name] Entered By: [name] on 07/23/2018 13:07:32 Guidone, Emeline Loletha Grayer ([id number]) -------------------------------------------------------------------------------- Wound Assessment Details Patient Name: [name], [name] Date of Service: 07/23/2018 12:45 PM Medical Record Number: [id number] Patient Account Number: [id number] Date of Birth/Sex: Jun 09, 1927 (85 y.o. F) Treating RN: Montey Hora Primary Care Wynston Romey: [name] Other Clinician: Referring Seri Kimmer: [name] Treating Jaylyn Booher/Extender: STONE III, [name] in Treatment: 17 Wound Status Wound Number: 8 Primary Diabetic Wound/Ulcer of the Lower Extremity Etiology: Wound Location: Right Lower Leg - Lateral Wound Open Wounding Event: Trauma Status: Date Acquired: 03/09/2018 Comorbid Cataracts, Anemia, Arrhythmia, Coronary Artery [name] Of Treatment: 17 History: Disease, Hypertension, Myocardial Infarction, Clustered Wound: No Type II Diabetes, Osteoarthritis Photos Wound Measurements Length: (cm) 2.9 % Reduction in Width: (cm) 1.2 % Reduction in Depth: (cm) 0.1 Epithelializat Area: (cm) 2.733 Tunneling: Volume: (cm) 0.273 Undermining: Area: -45% Volume: 51.7% ion: None No No Wound Description Classification: Grade 2 Foul Odor Aft Wound Margin: Flat and Intact Slough/Fibrin Exudate Amount: Medium Exudate Type: Purulent Exudate Color: yellow, brown, green er Cleansing: No o Yes Wound Bed Granulation Amount: None Present (0%) Exposed Structure Necrotic Amount: Large (67-100%) Fascia Exposed: No Necrotic Quality: Adherent Slough Fat Layer (Subcutaneous Tissue) Exposed: Yes Tendon Exposed: No Muscle Exposed: No Joint Exposed: No Bone Exposed: No Periwound Skin Texture Texture Color [name], [name] ([id number]) No Abnormalities Noted: No No Abnormalities Noted: No Callus: No Atrophie Blanche: No Crepitus: No Cyanosis: No Excoriation: No Ecchymosis: No Induration: No Erythema: No Rash: No Hemosiderin Staining: No Scarring: No Mottled: No Pallor: No Moisture Rubor: No No Abnormalities Noted: No Dry / Scaly: No Temperature / Pain Maceration: No Temperature: No Abnormality Tenderness on Palpation: Yes Electronic Signature(s) Signed: 07/24/2018 2:55:40 PM By: Montey Hora Entered By: Montey Hora on 07/23/2018 12:58:15 Daffin, [name]  ([id number]) -------------------------------------------------------------------------------- Vitals Details Patient Name: [name], [name] Date of Service: 07/23/2018 12:45 PM Medical Record Number: [id number] Patient Account Number: [id number] Date of Birth/Sex: Sep 17, 1927 (85 y.o. F) Treating RN: [name] Primary Care Aayan Haskew: [name] Other Clinician: Referring Josette Shimabukuro: [name] Treating Emonee Winkowski/Extender: [name], [name] in Treatment: 17 Vital Signs Time Taken: 12:49 Temperature (F): 97.9 Height (in): 58 Pulse (bpm): 68 Weight (lbs): 140 Respiratory Rate (breaths/min): 16 Body Mass Index (BMI): 29.3 Blood Pressure (mmHg): 112/52 Reference Range: 80 - 120 mg / dl Electronic Signature(s) Signed: 07/23/2018 2:49:09 PM By: [name] RCP, RRT, CHT Entered By: [name] on 07/23/2018 12:53:47

## 2018-11-30 NOTE — Progress Notes (Signed)
REIKA, CALLANAN ([id number]) Visit Report for 07/23/2018 Chief Complaint Document Details Patient Name: Carlton, Glenette C. Date of Service: 07/23/2018 12:45 PM Medical Record Number: [id number] Patient Account Number: [id number] Date of Birth/Sex: November 05, 1927 (85 y.o. F) Treating RN: Harold Barban Primary Care Provider: Ria Bush Other Clinician: Referring Provider: Ria Bush Treating Provider/Extender: Melburn Hake, HOYT Weeks in Treatment: 17 Information Obtained from: Patient Chief Complaint RLE wounds Electronic Signature(s) Signed: 07/23/2018 11:56:08 PM By: Worthy Keeler PA-C Entered By: Worthy Keeler on 07/23/2018 12:51:38 Mccollom, [name] ([id number]) -------------------------------------------------------------------------------- HPI Details Patient Name: Standifer, Emanuel C. Date of Service: 07/23/2018 12:45 PM Medical Record Number: [id number] Patient Account Number: [id number] Date of Birth/Sex: [birth year] (85 y.o. F) Treating RN: Harold Barban Primary Care Provider: Ria Bush Other Clinician: Referring Provider: Ria Bush Treating Provider/Extender: Melburn Hake, HOYT Weeks in Treatment: 17 History of Present Illness HPI Description: Readmission: 03/23/18 on evaluation today patient presents for readmission entire clinic concerning issues that she has been having for the past couple of weeks in regard to her right lower extremity. The medial injury actually occurred when her dog tried to take a blanket from her and subsequently scratched her with his toenail. Subsequently the lateral injury was a wound where she dropped a knife in her kitchen and hit the side of her leg. With that being said both have been open for some time and home health nurse that has been coming out marked for her where the redness was several days ago. Nonetheless this has spread will be on the margin of what was marked. She was placed on Keflex which was started two  days ago but at this point it does not seem to have helped with any improvement in the overall appearance of the wounds. No fevers, chills, nausea, or vomiting noted at this time. 04/02/18 Seen today for follow up and management of right lower extremity wounds. Right lateral wound with adherent slough. She is complaining of pain to touch which limited exam. Recently treated in the ED for cellulitis and wound culture positive for MRSA. Completed a dose of Vancomycin and transitioned to doxycycline by mouth. Erythema remains present with improvement since last visit. Right medial wound with scab present. No fevers, chills, nausea, or vomiting noted at this time. 04/10/18 on evaluation today patient appears to be doing a Foiles better in regard to her right lateral lower [name]. The slough does seem to be coming off although it is slow from the wound bed. Fortunately there does not appear to be any evidence of infection at this time which is good news. With that being said I do believe that she is going to require longer treatment with the Santyl simply due to the fact that I'm not able to really debride the wound due to the pain that she is experiencing. 04/16/18 upon evaluation today patient's wound bed currently on the lateral lower extremity on the right appears to be doing a Neyman better at this point. We are utilizing sensible currently. Overall I'm very pleased with how things are progressing although there still is some work to do before this will be completely healed. No fevers, chills, nausea, or vomiting noted at this time. 04/23/18 on evaluation today patient appears to be doing about the same at this point maybe just a Scalf bit smaller than last week's evaluation. She continues to have some slough buildup on the surface of the wound and has very Schlack granulation unfortunately. This seems to be very slow moving  I'm getting somewhat concerned about the possibility of her  having decreased blood flow which is leading to her subsequently not being able to heal this area as quickly as she shut otherwise. She is currently utilizing Santyl. 05/01/18 on evaluation today patient appears to be doing somewhat poorly in regard to her lower extremity ulcer. At this point she's been tolerating the dressing changes to some degree without complication. With that being said she does note of the past several days that she has been having more pain unfortunately. On inspection it appears that she may have signs of infection which would account for the pain. 05/07/18 on evaluation today patient's wound actually appears to be doing better she's not having as much erythema in the right lower extremity as compared to last week. Fortunately there's no signs of infection this is good news as well. Overall very pleased with the progress that she has made up to this point. 05/14/18 on evaluation today patient actually appears to be doing rather well in regard to her right lower extremity ulcer. She does not have erythema that was characterizing the cellulitis prior to initiation of antibiotic therapy. With that being said her wound still appears to be somewhat slow in healing I believe this may be arterial in nature that is my concern at least. With that being said I do want her to see the vascular specialist as soon as possible we been trying to get this set up I'm not sure if she's not answering the phone or what exactly is going on here. [name], [name] ([id number]) 05/21/18 on evaluation today patient's wound actually appears to be doing a Mccaughey better possibly some additional granulation compared to my last evaluation. With that being said she still continues to have a lot of pain especially when her legs are elevated such as sitting in the chair here in the office. There does not appear to be any signs of infection which is good news. 05/28/18 on evaluation today patient appears to be  doing a Seaborn better in regard to her right lateral lower [name]. Again she was supposed to have an appointment with vascular unfortunately they attempted to call her three times and unfortunately were unable to get anything scheduled. That reason closed up for him. She tells me that if the phone is answered and she doesn't recognize what is that she will hang up as well those that she lives with. Obviously she's not gonna recognize somebody calling from the office that she has not previously spoken with. Nonetheless the patient states that she does not remember any fall. I think if we're gonna get her in with vascular specialist we're gonna have to try to make the appointment for her. 06/04/2018 Seen today for follow up and management of right lateral lower extremity wound. Adherent slough present over surface of wound. Scheduled to follow up for ABI/TBI on 06/06/2018 with vein and vascular specialist. She is hard of hearing. Wound is sensitive to touch. No signs or symptoms of infection. Denies any recent fevers, chills, or SOB. 06/11/18 on evaluation today patient actually appears to be doing very well in regard to her right lateral lower extremity ulcer. She's been tolerating the dressing changes without complication. Fortunately there's no evidence of infection. With that being said she did have her arterial studies which revealed that she had a very low ABI which was 0.67 on the right with a TBI of 0.32 and 0.65 on the left with a TBI of 0.16. I  do believe she needs to see vascular and we are working on that referral as well. 06/21/18 on evaluation today patient's wound actually appears to be doing okay she does have an appointment with vascular coming up on the 24th. Nonetheless she's been having some issues that are more urinary in nature right now that are causing trouble for her. Fortunately there's no signs of severe sepsis although she is running a low-grade temperature 99.9  on evaluation today. She also is having some issues right now with chills as well as urinary urgency and frequency. Again I feel like the city is more urinary and symptomology I do not believe she has anything upper respiratory which is good news. Nonetheless I think this may need to be checked out more quickly especially in light of the fact that she's having significant back pain at this point. 07/02/18 on evaluation today patient appears to be doing okay in regard to her ulcer on the right lower extremity. She's been tolerating the dressing changes without complication. Fortunately there does not appear to be any signs of infection which is good news. No fevers, chills, nausea, or vomiting noted at this time. 07/10/18 on evaluation today patient appears to be doing well in regard to her right lateral lower extremity ulcer all things considering. She's been using this form this seems to be the one thing that she can really do on her own and she seems to be making some progress obviously help with this matter. I think the biggest issue limiting her feeling speed is her blood flow she canceled her original appointment with vascular her next one is not till May. She was worried about the Covid-19 Virus and having to see another physician. Nonetheless there's no signs of active infection at this time. 07/16/18 on evaluation today patient appears to be doing okay in regard to her right lateral lower extremity ulcer. She's been tolerating the dressing changes without complication. Fortunately there's no signs of active infection at this time. No fevers, chills, nausea, or vomiting noted at this time. 07/23/18 on evaluation today patient appears to be doing very well in regard to her right lateral load should be ulcer all things considering. She still again is not seeing vascular until mid May in the meantime she's been utilizing the over-the-counter tripling about appointment which seems to be the only  thing she's able to do routinely on her own without getting confused or frustrated. Overall it seems to be doing okay fortunately she's showing no signs of infection. Electronic Signature(s) Signed: 07/23/2018 11:56:08 PM By: Worthy Keeler PA-C Entered By: Worthy Keeler on 07/23/2018 14:25:29 Rought, [name] ([id number]) -------------------------------------------------------------------------------- Physical Exam Details Patient Name: Monie, Fantashia C. Date of Service: 07/23/2018 12:45 PM Medical Record Number: [id number] Patient Account Number: [id number] Date of Birth/Sex: Apr 08, 1927 (85 y.o. F) Treating RN: Harold Barban Primary Care Provider: Ria Bush Other Clinician: Referring Provider: Ria Bush Treating Provider/Extender: STONE III, HOYT Weeks in Treatment: 60 Constitutional Well-nourished and well-hydrated in no acute distress. Respiratory normal breathing without difficulty. clear to auscultation bilaterally. Cardiovascular regular rate and rhythm with normal S1, S2. Psychiatric this patient is able to make decisions and demonstrates good insight into disease process. Alert and Oriented x 3. pleasant and cooperative. Notes Patient's wound bed currently showed signs of good granulation at this time there was really no significant slough buildup at this point. She's very slow to heal but again I think this is a function of her poor vascular flow unfortunately.  Electronic Signature(s) Signed: 07/23/2018 11:56:08 PM By: Worthy Keeler PA-C Entered By: Worthy Keeler on 07/23/2018 14:25:57 Bonsell, [name] ([id number]) -------------------------------------------------------------------------------- Physician Orders Details Patient Name: Oyster, Leshea C. Date of Service: 07/23/2018 12:45 PM Medical Record Number: [id number] Patient Account Number: [id number] Date of Birth/Sex: [birth year] (85 y.o. F) Treating RN: Harold Barban Primary Care Provider:  Ria Bush Other Clinician: Referring Provider: Ria Bush Treating Provider/Extender: Melburn Hake, HOYT Weeks in Treatment: 15 Verbal / Phone Orders: No Diagnosis Coding ICD-10 Coding Code Description I87.331 Chronic venous hypertension (idiopathic) with ulcer and inflammation of right lower extremity E11.622 Type 2 diabetes mellitus with other skin ulcer L97.812 Non-pressure chronic ulcer of other part of right lower leg with fat layer exposed L03.115 Cellulitis of right lower limb Wound Cleansing Wound #8 Right,Lateral Lower Leg o Clean wound with Normal Saline. Anesthetic (add to Medication List) Wound #8 Right,Lateral Lower Leg o Topical Lidocaine 4% cream applied to wound bed prior to debridement (In Clinic Only). Primary Wound Dressing Wound #8 Right,Lateral Lower Leg o Other: - Antibiotic ointment Secondary Dressing Wound #8 Right,Lateral Lower Leg o ABD and Kerlix/Conform o Non-adherent pad Dressing Change Frequency Wound #8 Right,Lateral Lower Leg o Change dressing every day. Follow-up Appointments Wound #8 Right,Lateral Lower Leg o Return Appointment in 1 week. Edema Control Wound #8 Right,Lateral Lower Leg o Elevate legs to the level of the heart and pump ankles as often as possible o Other: - tubigrip Electronic Signature(s) Signed: 07/23/2018 11:56:08 PM By: Marlou Porch, [place] ([id number]) Signed: 11/30/2018 1:03:35 PM By: Harold Barban Entered By: Harold Barban on 07/23/2018 13:09:21 Scarpati, Malon CMarland Kitchen ([id number]) -------------------------------------------------------------------------------- Problem List Details Patient Name: Mallek, Lianny C. Date of Service: 07/23/2018 12:45 PM Medical Record Number: [id number] Patient Account Number: [id number] Date of Birth/Sex: Jan 30, 1928 (85 y.o. F) Treating RN: Harold Barban Primary Care Provider: Ria Bush Other Clinician: Referring Provider:  Ria Bush Treating Provider/Extender: Melburn Hake, HOYT Weeks in Treatment: 17 Active Problems ICD-10 Evaluated Encounter Code Description Active Date Today Diagnosis I87.331 Chronic venous hypertension (idiopathic) with ulcer and 03/23/2018 No Yes inflammation of right lower extremity E11.622 Type 2 diabetes mellitus with other skin ulcer 03/23/2018 No Yes L97.812 Non-pressure chronic ulcer of other part of right lower leg 03/23/2018 No Yes with fat layer exposed L03.115 Cellulitis of right lower limb 03/23/2018 No Yes Inactive Problems Resolved Problems Electronic Signature(s) Signed: 07/23/2018 11:56:08 PM By: Worthy Keeler PA-C Entered By: Worthy Keeler on 07/23/2018 12:51:33 Lequire, [name] ([id number]) -------------------------------------------------------------------------------- Progress Note Details Patient Name: Verville, Jamil C. Date of Service: 07/23/2018 12:45 PM Medical Record Number: [id number] Patient Account Number: [id number] Date of Birth/Sex: Jul 31, 1927 (85 y.o. F) Treating RN: Harold Barban Primary Care Provider: Ria Bush Other Clinician: Referring Provider: Ria Bush Treating Provider/Extender: Melburn Hake, HOYT Weeks in Treatment: 17 Subjective Chief Complaint Information obtained from Patient RLE wounds History of Present Illness (HPI) Readmission: 03/23/18 on evaluation today patient presents for readmission entire clinic concerning issues that she has been having for the past couple of weeks in regard to her right lower extremity. The medial injury actually occurred when her dog tried to take a blanket from her and subsequently scratched her with his toenail. Subsequently the lateral injury was a wound where she dropped a knife in her kitchen and hit the side of her leg. With that being said both have been open for some time and home health nurse that has been coming out marked for her where the redness  was several days ago.  Nonetheless this has spread will be on the margin of what was marked. She was placed on Keflex which was started two days ago but at this point it does not seem to have helped with any improvement in the overall appearance of the wounds. No fevers, chills, nausea, or vomiting noted at this time. 04/02/18 Seen today for follow up and management of right lower extremity wounds. Right lateral wound with adherent slough. She is complaining of pain to touch which limited exam. Recently treated in the ED for cellulitis and wound culture positive for MRSA. Completed a dose of Vancomycin and transitioned to doxycycline by mouth. Erythema remains present with improvement since last visit. Right medial wound with scab present. No fevers, chills, nausea, or vomiting noted at this time. 04/10/18 on evaluation today patient appears to be doing a Pendergrass better in regard to her right lateral lower [name]. The slough does seem to be coming off although it is slow from the wound bed. Fortunately there does not appear to be any evidence of infection at this time which is good news. With that being said I do believe that she is going to require longer treatment with the Santyl simply due to the fact that I'm not able to really debride the wound due to the pain that she is experiencing. 04/16/18 upon evaluation today patient's wound bed currently on the lateral lower extremity on the right appears to be doing a Servais better at this point. We are utilizing sensible currently. Overall I'm very pleased with how things are progressing although there still is some work to do before this will be completely healed. No fevers, chills, nausea, or vomiting noted at this time. 04/23/18 on evaluation today patient appears to be doing about the same at this point maybe just a Fano bit smaller than last week's evaluation. She continues to have some slough buildup on the surface of the wound and has very Ferch  granulation unfortunately. This seems to be very slow moving I'm getting somewhat concerned about the possibility of her having decreased blood flow which is leading to her subsequently not being able to heal this area as quickly as she shut otherwise. She is currently utilizing Santyl. 05/01/18 on evaluation today patient appears to be doing somewhat poorly in regard to her lower extremity ulcer. At this point she's been tolerating the dressing changes to some degree without complication. With that being said she does note of the past several days that she has been having more pain unfortunately. On inspection it appears that she may have signs of infection which would account for the pain. 05/07/18 on evaluation today patient's wound actually appears to be doing better she's not having as much erythema in the right lower extremity as compared to last week. Fortunately there's no signs of infection this is good news as well. Overall very pleased with the progress that she has made up to this point. CATHRINE, KRIZAN ([id number]) 05/14/18 on evaluation today patient actually appears to be doing rather well in regard to her right lower extremity ulcer. She does not have erythema that was characterizing the cellulitis prior to initiation of antibiotic therapy. With that being said her wound still appears to be somewhat slow in healing I believe this may be arterial in nature that is my concern at least. With that being said I do want her to see the vascular specialist as soon as possible we been trying to get this set  up I'm not sure if she's not answering the phone or what exactly is going on here. 05/21/18 on evaluation today patient's wound actually appears to be doing a Obey better possibly some additional granulation compared to my last evaluation. With that being said she still continues to have a lot of pain especially when her legs are elevated such as sitting in the chair here in the office.  There does not appear to be any signs of infection which is good news. 05/28/18 on evaluation today patient appears to be doing a Sochacki better in regard to her right lateral lower [name]. Again she was supposed to have an appointment with vascular unfortunately they attempted to call her three times and unfortunately were unable to get anything scheduled. That reason closed up for him. She tells me that if the phone is answered and she doesn't recognize what is that she will hang up as well those that she lives with. Obviously she's not gonna recognize somebody calling from the office that she has not previously spoken with. Nonetheless the patient states that she does not remember any fall. I think if we're gonna get her in with vascular specialist we're gonna have to try to make the appointment for her. 06/04/2018 Seen today for follow up and management of right lateral lower extremity wound. Adherent slough present over surface of wound. Scheduled to follow up for ABI/TBI on 06/06/2018 with vein and vascular specialist. She is hard of hearing. Wound is sensitive to touch. No signs or symptoms of infection. Denies any recent fevers, chills, or SOB. 06/11/18 on evaluation today patient actually appears to be doing very well in regard to her right lateral lower extremity ulcer. She's been tolerating the dressing changes without complication. Fortunately there's no evidence of infection. With that being said she did have her arterial studies which revealed that she had a very low ABI which was 0.67 on the right with a TBI of 0.32 and 0.65 on the left with a TBI of 0.16. I do believe she needs to see vascular and we are working on that referral as well. 06/21/18 on evaluation today patient's wound actually appears to be doing okay she does have an appointment with vascular coming up on the 24th. Nonetheless she's been having some issues that are more urinary in nature right now that are  causing trouble for her. Fortunately there's no signs of severe sepsis although she is running a low-grade temperature 99.9 on evaluation today. She also is having some issues right now with chills as well as urinary urgency and frequency. Again I feel like the city is more urinary and symptomology I do not believe she has anything upper respiratory which is good news. Nonetheless I think this may need to be checked out more quickly especially in light of the fact that she's having significant back pain at this point. 07/02/18 on evaluation today patient appears to be doing okay in regard to her ulcer on the right lower extremity. She's been tolerating the dressing changes without complication. Fortunately there does not appear to be any signs of infection which is good news. No fevers, chills, nausea, or vomiting noted at this time. 07/10/18 on evaluation today patient appears to be doing well in regard to her right lateral lower extremity ulcer all things considering. She's been using this form this seems to be the one thing that she can really do on her own and she seems to be making some progress obviously help with  this matter. I think the biggest issue limiting her feeling speed is her blood flow she canceled her original appointment with vascular her next one is not till May. She was worried about the Covid-19 Virus and having to see another physician. Nonetheless there's no signs of active infection at this time. 07/16/18 on evaluation today patient appears to be doing okay in regard to her right lateral lower extremity ulcer. She's been tolerating the dressing changes without complication. Fortunately there's no signs of active infection at this time. No fevers, chills, nausea, or vomiting noted at this time. 07/23/18 on evaluation today patient appears to be doing very well in regard to her right lateral load should be ulcer all things considering. She still again is not seeing vascular until  mid May in the meantime she's been utilizing the over-the-counter tripling about appointment which seems to be the only thing she's able to do routinely on her own without getting confused or frustrated. Overall it seems to be doing okay fortunately she's showing no signs of infection. Patient History Information obtained from Patient. SAE, HANDRICH ([id number]) Family History Diabetes - Father,Siblings,Child, Hypertension - Child, Lung Disease - Child, Stroke - Mother, No family history of Cancer, Heart Disease, Kidney Disease, Seizures, Thyroid Problems. Social History Former smoker, Marital Status - Single, Alcohol Use - Never, Drug Use - No History, Caffeine Use - Daily. Medical History Eyes Patient has history of Cataracts - removed Denies history of Glaucoma, Optic Neuritis Hematologic/Lymphatic Patient has history of Anemia Denies history of Hemophilia, Human Immunodeficiency Virus, Lymphedema, Sickle Cell Disease Respiratory Denies history of Aspiration, Asthma, Chronic Obstructive Pulmonary Disease (COPD), Pneumothorax, Sleep Apnea, Tuberculosis Cardiovascular Patient has history of Arrhythmia, Coronary Artery Disease, Hypertension, Myocardial Infarction Denies history of Angina, Congestive Heart Failure, Deep Vein Thrombosis, Hypotension, Peripheral Arterial Disease, Peripheral Venous Disease, Phlebitis Endocrine Patient has history of Type II Diabetes Denies history of Type I Diabetes Genitourinary Denies history of End Stage Renal Disease Immunological Denies history of Lupus Erythematosus, Raynaud s, Scleroderma Integumentary (Skin) Denies history of History of Burn, History of pressure wounds Musculoskeletal Patient has history of Osteoarthritis Denies history of Gout, Rheumatoid Arthritis, Osteomyelitis Neurologic Denies history of Dementia, Neuropathy, Quadriplegia, Paraplegia, Seizure Disorder Oncologic Denies history of Received Chemotherapy, Received  Radiation Medical And Surgical History Notes Constitutional Symptoms (General Health) High Blood Pressure, open heart surgery, gall stone, hysterectomy; Fluid pill, Anxiety Review of Systems (ROS) Constitutional Symptoms (General Health) Denies complaints or symptoms of Fatigue, Fever, Chills, Marked Weight Change. Respiratory Denies complaints or symptoms of Chronic or frequent coughs, Shortness of Breath. Cardiovascular Denies complaints or symptoms of Chest pain, LE edema. Psychiatric Denies complaints or symptoms of Anxiety, Claustrophobia. Helbig, Brielynn C. ([id number]) Objective Constitutional Well-nourished and well-hydrated in no acute distress. Vitals Time Taken: 12:49 PM, Height: 58 in, Weight: 140 lbs, BMI: 29.3, Temperature: 97.9 F, Pulse: 68 bpm, Respiratory Rate: 16 breaths/min, Blood Pressure: 112/52 mmHg. Respiratory normal breathing without difficulty. clear to auscultation bilaterally. Cardiovascular regular rate and rhythm with normal S1, S2. Psychiatric this patient is able to make decisions and demonstrates good insight into disease process. Alert and Oriented x 3. pleasant and cooperative. General Notes: Patient's wound bed currently showed signs of good granulation at this time there was really no significant slough buildup at this point. She's very slow to heal but again I think this is a function of her poor vascular flow unfortunately. Integumentary (Hair, Skin) Wound #8 status is Open. Original cause of wound was Trauma. The  wound is located on the Right,Lateral Lower Leg. The wound measures 2.9cm length x 1.2cm width x 0.1cm depth; 2.733cm^2 area and 0.273cm^3 volume. There is Fat Layer (Subcutaneous Tissue) Exposed exposed. There is no tunneling or undermining noted. There is a medium amount of purulent drainage noted. The wound margin is flat and intact. There is no granulation within the wound bed. There is a large (67-100%) amount of necrotic tissue  within the wound bed including Adherent Slough. The periwound skin appearance did not exhibit: Callus, Crepitus, Excoriation, Induration, Rash, Scarring, Dry/Scaly, Maceration, Atrophie Blanche, Cyanosis, Ecchymosis, Hemosiderin Staining, Mottled, Pallor, Rubor, Erythema. Periwound temperature was noted as No Abnormality. The periwound has tenderness on palpation. Assessment Active Problems ICD-10 Chronic venous hypertension (idiopathic) with ulcer and inflammation of right lower extremity Type 2 diabetes mellitus with other skin ulcer Non-pressure chronic ulcer of other part of right lower leg with fat layer exposed Cellulitis of right lower limb Plan Wound Cleansing: Wound #8 Right,Lateral Lower Leg: Clean wound with Normal Saline. Anesthetic (add to Medication List): Burger, [place] ([id number]) Wound #8 Right,Lateral Lower Leg: Topical Lidocaine 4% cream applied to wound bed prior to debridement (In Clinic Only). Primary Wound Dressing: Wound #8 Right,Lateral Lower Leg: Other: - Antibiotic ointment Secondary Dressing: Wound #8 Right,Lateral Lower Leg: ABD and Kerlix/Conform Non-adherent pad Dressing Change Frequency: Wound #8 Right,Lateral Lower Leg: Change dressing every day. Follow-up Appointments: Wound #8 Right,Lateral Lower Leg: Return Appointment in 1 week. Edema Control: Wound #8 Right,Lateral Lower Leg: Elevate legs to the level of the heart and pump ankles as often as possible Other: - tubigrip Patient's wound bed currently showed signs of good granulation at this time there was really no significant slough buildup at this point. She's very slow to heal but again I think this is a function of her poor vascular flow unfortunately. My suggestion at this point is gonna be that we go ahead and continue with the above wound care measures for the next week and the patient is in agreement with plan. We will subsequently see were things that at follow-up. Anything  changes worsens meantime shall contact the office and let me know. Please see above for specific wound care orders. We will see patient for re-evaluation in 1 week(s) here in the clinic. If anything worsens or changes patient will contact our office for additional recommendations. Electronic Signature(s) Signed: 07/23/2018 11:56:08 PM By: Worthy Keeler PA-C Entered By: Worthy Keeler on 07/23/2018 14:26:11 Kring, [name] ([id number]) -------------------------------------------------------------------------------- ROS/PFSH Details Patient Name: Kelley, Ellana C. Date of Service: 07/23/2018 12:45 PM Medical Record Number: [id number] Patient Account Number: [id number] Date of Birth/Sex: 1927-11-28 (85 y.o. F) Treating RN: Harold Barban Primary Care Provider: Ria Bush Other Clinician: Referring Provider: Ria Bush Treating Provider/Extender: Melburn Hake, HOYT Weeks in Treatment: 17 Information Obtained From Patient Constitutional Symptoms (General Health) Complaints and Symptoms: Negative for: Fatigue; Fever; Chills; Marked Weight Change Medical History: Past Medical History Notes: High Blood Pressure, open heart surgery, gall stone, hysterectomy; Fluid pill, Anxiety Respiratory Complaints and Symptoms: Negative for: Chronic or frequent coughs; Shortness of Breath Medical History: Negative for: Aspiration; Asthma; Chronic Obstructive Pulmonary Disease (COPD); Pneumothorax; Sleep Apnea; Tuberculosis Cardiovascular Complaints and Symptoms: Negative for: Chest pain; LE edema Medical History: Positive for: Arrhythmia; Coronary Artery Disease; Hypertension; Myocardial Infarction Negative for: Angina; Congestive Heart Failure; Deep Vein Thrombosis; Hypotension; Peripheral Arterial Disease; Peripheral Venous Disease; Phlebitis Psychiatric Complaints and Symptoms: Negative for: Anxiety; Claustrophobia Eyes Medical History: Positive for: Cataracts -  removed  Negative for: Glaucoma; Optic Neuritis Hematologic/Lymphatic Medical History: Positive for: Anemia Negative for: Hemophilia; Human Immunodeficiency Virus; Lymphedema; Sickle Cell Disease Endocrine PRICELLA, GAUGH ([id number]) Medical History: Positive for: Type II Diabetes Negative for: Type I Diabetes Time with diabetes: no idea Treated with: Oral agents Blood sugar tested every day: No Genitourinary Medical History: Negative for: End Stage Renal Disease Immunological Medical History: Negative for: Lupus Erythematosus; Raynaudos; Scleroderma Integumentary (Skin) Medical History: Negative for: History of Burn; History of pressure wounds Musculoskeletal Medical History: Positive for: Osteoarthritis Negative for: Gout; Rheumatoid Arthritis; Osteomyelitis Neurologic Medical History: Negative for: Dementia; Neuropathy; Quadriplegia; Paraplegia; Seizure Disorder Oncologic Medical History: Negative for: Received Chemotherapy; Received Radiation HBO Extended History Items Eyes: Cataracts Immunizations Pneumococcal Vaccine: Received Pneumococcal Vaccination: Yes Implantable Devices No devices added Family and Social History Cancer: No; Diabetes: Yes - Father,Siblings,Child; Heart Disease: No; Hypertension: Yes - Child; Kidney Disease: No; Lung Disease: Yes - Child; Seizures: No; Stroke: Yes - Mother; Thyroid Problems: No; Former smoker; Marital Status - Single; Alcohol Use: Never; Drug Use: No History; Caffeine Use: Daily; Financial Concerns: No; Food, Clothing or Shelter Needs: No; Support System Lacking: No; Transportation Concerns: No Physician Affirmation I have reviewed and agree with the above information. GENEVIVE, PRINTUP ([id number]) Electronic Signature(s) Signed: 07/23/2018 11:56:08 PM By: Worthy Keeler PA-C Signed: 11/30/2018 1:03:35 PM By: Harold Barban Entered By: Worthy Keeler on 07/23/2018 14:25:46 Kassel, [name]  ([id number]) -------------------------------------------------------------------------------- SuperBill Details Patient Name: Rosete, Zakya C. Date of Service: 07/23/2018 Medical Record Number: [id number] Patient Account Number: [id number] Date of Birth/Sex: 01-Apr-1928 (85 y.o. F) Treating RN: Harold Barban Primary Care Provider: Ria Bush Other Clinician: Referring Provider: Ria Bush Treating Provider/Extender: Melburn Hake, HOYT Weeks in Treatment: 17 Diagnosis Coding ICD-10 Codes Code Description I87.331 Chronic venous hypertension (idiopathic) with ulcer and inflammation of right lower extremity E11.622 Type 2 diabetes mellitus with other skin ulcer L97.812 Non-pressure chronic ulcer of other part of right lower leg with fat layer exposed L03.115 Cellulitis of right lower limb Facility Procedures CPT4 Code: 16606004 Description: 59977 - WOUND CARE VISIT-LEV 2 EST PT Modifier: Quantity: 1 Physician Procedures CPT4: Description Modifier Quantity Code 4142395 99214 - WC PHYS LEVEL 4 - EST PT 1 ICD-10 Diagnosis Description I87.331 Chronic venous hypertension (idiopathic) with ulcer and inflammation of right lower extremity E11.622 Type 2 diabetes mellitus with  other skin ulcer L97.812 Non-pressure chronic ulcer of other part of right lower leg with fat layer exposed L03.115 Cellulitis of right lower limb Electronic Signature(s) Signed: 07/23/2018 11:56:08 PM By: Worthy Keeler PA-C Entered By: Worthy Keeler on 07/23/2018 14:26:30

## 2018-12-03 ENCOUNTER — Other Ambulatory Visit: Payer: Self-pay | Admitting: Family Medicine

## 2018-12-03 NOTE — Telephone Encounter (Signed)
Patient needs a refill on her Hydrocodone.Patient needs a rx Furosemide called in to her pharmacy,also.  Patient uses [facility].

## 2018-12-04 DIAGNOSIS — H353131 Nonexudative age-related macular degeneration, bilateral, early dry stage: Secondary | ICD-10-CM | POA: Diagnosis not present

## 2018-12-05 MED ORDER — FUROSEMIDE 20 MG PO TABS: 10.0000 mg | ORAL_TABLET | Freq: Every day | ORAL | 0 refills | Status: DC

## 2018-12-05 MED ORDER — HYDROCODONE-ACETAMINOPHEN 5-325 MG PO TABS: 0.5000 | ORAL_TABLET | Freq: Four times a day (QID) | ORAL | 0 refills | Status: DC | PRN

## 2018-12-05 NOTE — Telephone Encounter (Signed)
Spoke with pt notifying her rxs were sent in.  She expresses her thanks.

## 2018-12-05 NOTE — Addendum Note (Signed)
Addended by: Ria Bush on: 12/05/2018 02:40 PM   Modules accepted: Orders

## 2018-12-05 NOTE — Telephone Encounter (Signed)
E-scribed furosemide  Name of Medication: Hydrocodone-APAP Name of Pharmacy: [facility] Last Fill or Written Date and Quantity: 10/24/18, #60 Last Office Visit and Type: 11/14/18, chronic pain f/u Next Office Visit and Type: 02/01/19, CPE Prt 2 Last Controlled Substance Agreement Date: 10/11/17 Last UDS: 10/11/17

## 2018-12-05 NOTE — Telephone Encounter (Signed)
Patient left another voicemail requesting refills on Hydrocodone and Furosemide. Patient stated that she is running out.

## 2018-12-05 NOTE — Telephone Encounter (Signed)
Plz notify this was E prescribed 

## 2018-12-12 NOTE — Progress Notes (Signed)
[place] Date of Birth  [birth year] [name] [address]    [place],   [address] [phone]  Fax  [phone]   Problem list 1. Coronary artery disease-status post CABG in 1996 2. Diabetes mellitus 3. Hypertension 4. Hyperlipidemia 5. Right  DVT.    [name] is an 85 yo female with a Hx of CAD, CABG ( November 09, 1994), diabetes mellitus, anxiety, HTN, hyperlipidemia.   She's been under a lot of stress recently. Her son died last year. She's under a lot of stress with the extra expenses involved such as his funeral. She presented with some CP in December.  A stress myoview revealed lateral ischemia.  We offered cardiac cath but she did not want to have the cath. She presents today without any particular complaints. She has not had any episodes of chest pain or shortness of breath since I saw her a month ago. She does have occasional episodes of tachycardia. She also notes that she has some anxiety. She was last seen in January after having an abnormal stress test that revealed anterior apical ischemia. She refused cardiac catheterization at that time. She has not had any recent episodes of chest pain.  She does not want to have a cardiac catheterization since she's feeling so well.  She has had some chest burning that is nearly constant.  I suggest several times that she should consider getting the cath done and she changed to subject.  September 04, 2012:  [name] is doing well from a cardiac standpoint.  She is still working - despite having lots of back pain.  No CP .  She had some CP several years ago and had a mildly abnormal myoview.  She did not want to have a cardiac cath at that time and has not had any significant epidoses of CP since that time.  She continues to be under lots of stress  She complains of back pain, stomach pain, burning in her legs.    Nov. 24, 2014:  Pt is doing OK from a cardiac standpoint.  Lots of stress - she has been  running the [employer] by herself for the past several months.  She is not getting enough rest.  Sleeping 4 1/2 hours at night - tries to get a nap for an hour.    September 09, 2013:  [name] is doing ok.  She is under lots of stress as usual.    Nov. 10, 2015:  [name] presents with various aches and pains.  She has a vericose vein in her leg that bled. She is on Xarelto for a right leg DVT on Sept. 30. .   CT angio a week later was negative for PE.   She has lots of anxiety.  Has lots of chest pain.  The pain is not pleuretic.  The pain is not associated with exercise.  She does not have any pain while she is working but more often occurs with stress.   She is living with 2 elderly men now and has lots of stressful issues relating to that.    This seems to be a major cause of her pain.  She does have 83 year old SVGs  September 23, 2014:  Doing well from a cardiac standpoint. Lots of anxiety .  Has a open right heel ulcer.   Jan. 9, 2017  [name] has been doing ok No CP.  No dyspnea Has had right ankle pain and swelling  Occasionally has faster than normal heart rate,  No associated dyspnea , CP or dizziness.  Has had an upper respiratory tract infection  Recently   October 19, 2015:  Has had a UTI since I last saw her.  Was in the hospital  Had new A-fib with RVR with the hospitalization  Has converted back to NSR .   Aug 03, 2016:  No serious cardiac symptoms.   Has occasional episodes of tachycardia   February 21, 2018:  Doing well from a cardiac standpoint .   Seen with Granddaughter in [place]. Has some palpitations.  Especially when she gets up too fast.    April 09, 2018:  Has redness and swelling of her right leg, Has an open woulnd,    Dropped a knife on her leg. Has finished her Abx treatment .    Sept. 10., 2020  [name] is seen for follow up of her CAD and leg edema  Feels that her HR is irreglual, Has been this way for 1-2 months  No CP      Current Outpatient Medications on File Prior to Visit  Medication Sig Dispense Refill  . acetaminophen (TYLENOL) 500 MG tablet Take 500 mg by mouth every 6 (six) hours as needed for mild pain.    Marland Kitchen ALPRAZolam (XANAX) 1 MG tablet Take 0.5 mg by mouth 4 (four) times daily as needed for anxiety.     . collagenase (SANTYL) ointment Apply topically daily. 15 g 0  . diclofenac sodium (VOLTAREN) 1 % GEL Apply 1 application topically 3 (three) times daily. 1 Tube 1  . furosemide (LASIX) 20 MG tablet Take 0.5 tablets (10 mg total) by mouth daily. 45 tablet 0  . glucosamine-chondroitin 500-400 MG tablet Take 1 tablet by mouth daily.     Marland Kitchen glucose blood (ONE TOUCH ULTRA TEST) test strip USE AS INSTRUCTED TO CHECK BLOOD SUGAR ONCE A DAY Dx code: E11.9 50 each 5  . HYDROcodone-acetaminophen (NORCO/VICODIN) 5-325 MG tablet Take 0.5 tablets by mouth every 6 (six) hours as needed for moderate pain. 60 tablet 0  . loperamide (IMODIUM) 2 MG capsule Take 2 mg by mouth 4 (four) times daily as needed for diarrhea or loose stools.    Marland Kitchen loratadine (CLARITIN) 10 MG tablet Take 10 mg by mouth daily as needed for allergies.    . metoprolol tartrate (LOPRESSOR) 25 MG tablet TAKE ONE AND HALF TABLETS (37.5 MG TOTAL) BY MOUTH 2 (TWO) TIMES DAILY. 270 tablet 1  . Multiple Vitamin (MULTIVITAMIN) tablet Take 1 tablet by mouth daily. Patient reports takes a new vitamin to replace her centrum as recommended by her eye doctor.    . nitroGLYCERIN (NITROSTAT) 0.4 MG SL tablet Place 1 tablet (0.4 mg total) under the tongue every 5 (five) minutes as needed for chest pain. 25 tablet 3  . nystatin cream (MYCOSTATIN) Apply 1 application topically 2 (two) times daily. 45 g 0  . nystatin-triamcinolone ointment (MYCOLOG) APPLY 1 APPLICATION TOPICALLY 2 (TWO) TIMES DAILY. AS NEEDED FOR ITCHING 60 g 1  . ONETOUCH VERIO test strip USE TO TEST BLOOD SUGAR ONCE DAILY 100 each 6  . pantoprazole (PROTONIX) 40 MG tablet TAKE 1 TABLET BY MOUTH  EVERY DAY 30 tablet 3  . pioglitazone (ACTOS) 30 MG tablet TAKE 1 TABLET BY MOUTH EVERY DAY 90 tablet 3  . polyethylene glycol powder (GLYCOLAX/MIRALAX) powder Take 17 g by mouth daily. Hold for diarrhea 3350 g 1  . pravastatin (PRAVACHOL) 20 MG  tablet TAKE 1 TABLET BY MOUTH EVERY DAY 90 tablet 3  . Probiotic Product (ALIGN) 4 MG CAPS Take 1 capsule (4 mg total) by mouth daily.    . ramipril (ALTACE) 5 MG capsule TAKE ONE CAPSULE BY MOUTH EVERYDAY FOR BLOOD PRESSURE 90 capsule 1  . senna-docusate (SENOKOT-S) 8.6-50 MG tablet Take 1 tablet by mouth daily as needed for moderate constipation. 30 tablet 0  . traZODone (DESYREL) 150 MG tablet Take 150 mg by mouth at bedtime.     . triamcinolone cream (KENALOG) 0.1 % APPLY TO AFFECTED AREA TWICE A DAY 80 g 0  . vitamin B-12 (CYANOCOBALAMIN) 1000 MCG tablet Take 1,000 mcg by mouth daily.    . vitamin E 400 UNIT capsule Take 400 Units by mouth daily.     No current facility-administered medications on file prior to visit.     Allergies  Allergen Reactions  . Metformin And Related Diarrhea    Past Medical History:  Diagnosis Date  . Acute deep vein thrombosis (DVT) of distal vein of right lower extremity ([place]) 12/2013   Acute thrombus of R proximal gastrocnemius vein. Symptomatic provoked distal DVT  . Anxiety   . Cellulitis of leg, left 01/16/2014  . Chronic back pain 03/2012   lumbar DDD with herniation - s/p L S1 transforaminal ESI (10/2012 Dr. Sharlet [place])  . Chronic venous insufficiency   . Coronary artery disease    CABG 1998  . Cystocele   . Depression   . Diabetes mellitus    type 2, followed by endo.  . Diastolic dysfunction   . Diverticulosis   . Hemorrhoids   . History of heart attack   . Hx of echocardiogram    a. Echo 10/13: mild LVH, EF 55-60%, Gr 1 diast dysfn, PASP 32  . Hyperlipidemia   . Hypertension   . Irritable bowel syndrome   . Microcytic anemia   . PAD (peripheral artery disease) ([place]) 2013   ABI: R 0.91, L 0.83   . PUD (peptic ulcer disease)   . Varicose veins   . Venous ulcer of leg ([place]) 12/12/2013   Required prolonged treatment by wound center  . Viral gastroenteritis due to [place] virus 05/13/2015   hospitalization    Past Surgical History:  Procedure Laterality Date  . ABDOMINAL HYSTERECTOMY  1975   TAH,BSO  . ABI  2013   R 0.91, L 0.83  . APPENDECTOMY     per pt report  . CARDIOVASCULAR STRESS TEST  2015   low risk myoview ([facility])  . CATARACT SURGERY    . CHOLECYSTECTOMY    . COLONOSCOPY  02/2010   extensive diverticulosis throughout, small internal hemorrhoids, rec rpt 5 yrs (Dr. Allyn Kenner)  . CORONARY ARTERY BYPASS GRAFT  1998  . dexa scan  09/2011   femur -0.8, forearm 0.6 => normal  . ENDOVENOUS ABLATION SAPHENOUS VEIN W/ LASER Left 03/2014   Kellie Simmering  . ESI  2014   L S1 transforaminal ESI x2 ([place]) without improvement  . HAND SURGERY    . lexiscan myoview  03/2011   Small basal inferolateral and anterolateral reversible perfusion defect suggests ischemia.  EF was normal.   old infarct, no new ischemia  . LOWER EXTREMITY ANGIOGRAPHY Right 09/06/2018   Procedure: LOWER EXTREMITY ANGIOGRAPHY;  Surgeon: Algernon Huxley, MD;  Location: [facility];  Service: Cardiovascular;  Laterality: Right;  . toe surgery    . TYMPANOPLASTY  1960s   R side  . Vault prolapse  A & P repair  2009   [facility]    Social History   Tobacco Use  Smoking Status Former Smoker  . Quit date: 03/05/1979  . Years since quitting: 39.8  Smokeless Tobacco Never Used    Social History   Substance and Sexual Activity  Alcohol Use No  . Alcohol/week: 0.0 standard drinks    Family History  Problem Relation Age of Onset  . Hypertension Mother   . Stroke Mother   . Diabetes Father   . Diabetes Sister   . Diabetes Brother   . Heart disease Son   . Colon cancer Neg Hx     Reviw of Systems:  Reviewed in the HPI.  All other systems are negative.   Physical Exam: Blood  pressure 126/74, pulse (!) 101, height 4\' 9"  (1.448 m), weight 143 lb (64.9 kg), SpO2 96 %.  GEN:  Elderly , frail female  HEENT: Normal NECK: No JVD; No carotid bruits LYMPHATICS: No lymphadenopathy CARDIAC:  Irreg. Irreg.  RESPIRATORY:  Clear to auscultation without rales, wheezing or rhonchi  ABDOMEN: Soft, non-tender, non-distended MUSCULOSKELETAL:  No edema; No deformity  SKIN: Warm and dry NEUROLOGIC:  Alert and oriented x 3  NEUROLOGIC:  Alert and oriented x 3     ECG:   Sept. 10 , 2020 Atrial fib ,  Low voltage.    Assessment / Plan:   1. Coronary artery disease-status post CABG in 1996 - She is not having any further episodes of angina.   2. Paroxysmal atrial fib :  She had an episode of AF following her CABG years ago .   She  is back in atrial fib today .   Has been in AF for the past month or 2.  Will start eliquis 5 mg BID  DC ASA  Will have her see an APP in approximately 4 weeks.  If she still in atrial fibrillation will schedule her for cardioversion. I think this will help quite a bit with her breathing.   3. Shortness of breath:      She is having worsening shortness of breath now that she is developed atrial fibrillation.  That after her bypass surgery 15 to 20 years ago   4. Hypertension - BP is well controlled.   5. Hyperlipidemia - stable for now   6. Right  DVT.  -   7.  Right lower leg wound.  Sees the wound clinic   Mertie Moores, MD  12/13/2018 5:43 PM    [address] [place],  [place], [place]  [address] Pager [phone] Phone: [phone]; Fax: [phone]

## 2018-12-13 ENCOUNTER — Encounter: Payer: Self-pay | Admitting: Cardiovascular Disease

## 2018-12-13 ENCOUNTER — Other Ambulatory Visit: Payer: Self-pay

## 2018-12-13 ENCOUNTER — Ambulatory Visit (INDEPENDENT_AMBULATORY_CARE_PROVIDER_SITE_OTHER): Payer: Medicare Other | Admitting: Cardiovascular Disease

## 2018-12-13 VITALS — BP 126/74 | HR 101 | Ht <= 58 in | Wt 143.0 lb

## 2018-12-13 DIAGNOSIS — I480 Paroxysmal atrial fibrillation: Secondary | ICD-10-CM

## 2018-12-13 DIAGNOSIS — I7025 Atherosclerosis of native arteries of other extremities with ulceration: Secondary | ICD-10-CM | POA: Diagnosis not present

## 2018-12-13 DIAGNOSIS — I2510 Atherosclerotic heart disease of native coronary artery without angina pectoris: Secondary | ICD-10-CM | POA: Diagnosis not present

## 2018-12-13 DIAGNOSIS — Z951 Presence of aortocoronary bypass graft: Secondary | ICD-10-CM | POA: Diagnosis not present

## 2018-12-13 MED ORDER — APIXABAN 5 MG PO TABS: 5.0000 mg | ORAL_TABLET | Freq: Two times a day (BID) | ORAL | 11 refills | Status: DC

## 2018-12-13 NOTE — Patient Instructions (Signed)
Medication Instructions:  Your physician has recommended you make the following change in your medication:  STOP Aspirin START Eliquis 5 mg twice daily  If you need a refill on your cardiac medications before your next appointment, please call your pharmacy.   Lab work: None Ordered  Testing/Procedures: None Ordered  Follow-Up: At [facility], you and your health needs are our priority.  As part of our continuing mission to provide you with exceptional heart care, we have created designated Provider Care Teams.  These Care Teams include your primary Cardiologist (physician) and Advanced Practice Providers (APPs -  Physician Assistants and Nurse Practitioners) who all work together to provide you with the care you need, when you need it. You will need a follow up appointment in:  4 weeks with EKG.  You may see Mertie Moores, MD or one of the following Advanced Practice Providers on your designated Care Team: [name], PA-C [place], [place] . Daune Perch, NP  Any Other Special Instructions Will Be Listed Below (If Applicable).

## 2018-12-19 ENCOUNTER — Other Ambulatory Visit: Payer: Self-pay

## 2018-12-20 ENCOUNTER — Ambulatory Visit (INDEPENDENT_AMBULATORY_CARE_PROVIDER_SITE_OTHER): Payer: Medicare Other | Admitting: Gynecology

## 2018-12-20 ENCOUNTER — Encounter: Payer: Self-pay | Admitting: Gynecology

## 2018-12-20 VITALS — BP 126/82 | Ht <= 58 in | Wt 143.0 lb

## 2018-12-20 DIAGNOSIS — Z01419 Encounter for gynecological examination (general) (routine) without abnormal findings: Secondary | ICD-10-CM

## 2018-12-20 DIAGNOSIS — N8111 Cystocele, midline: Secondary | ICD-10-CM

## 2018-12-20 DIAGNOSIS — Z9189 Other specified personal risk factors, not elsewhere classified: Secondary | ICD-10-CM | POA: Diagnosis not present

## 2018-12-20 DIAGNOSIS — N952 Postmenopausal atrophic vaginitis: Secondary | ICD-10-CM

## 2018-12-20 NOTE — Progress Notes (Signed)
    [place] Dec 22, 1927 [id number]        85 y.o.  G4P2001 for breast and pelvic exam  Past medical history,surgical history, problem list, medications, allergies, family history and social history were all reviewed and documented as reviewed in the EPIC chart.  ROS:  Performed with pertinent positives and negatives included in the history, assessment and plan.   Additional significant findings : None   Exam: Caryn Bee assistant Vitals:   12/20/18 1439  BP: 126/82  Weight: 143 lb (64.9 kg)  Height: 4\' 9"  (1.448 m)   Body mass index is 30.94 kg/m.  General appearance:  Normal affect, orientation and appearance. Skin: Grossly normal HEENT: Without gross lesions.  No cervical or supraclavicular adenopathy. Thyroid normal.  Lungs:  Clear without wheezing, rales or rhonchi Cardiac: RR, without RMG Abdominal:  Soft, nontender, without masses, guarding, rebound, organomegaly or hernia Breasts:  Examined lying and sitting without masses, retractions, discharge or axillary adenopathy. Pelvic:  Ext, BUS, Vagina: With atrophic changes.  First-degree cystocele noted.  Cuff well supported  Adnexa: Without masses or tenderness    Anus and perineum: Normal   Rectovaginal: Normal sphincter tone without palpated masses or tenderness.    Assessment/Plan:  85 y.o. G31P2001 female for breast and pelvic exam  1. Postmenopausal.  Status post hysterectomy in the past with subsequent vaginal vault suspension with A&P repair by Dr. Rhodia Albright 2009.  Without significant menopausal symptoms.  Mild cystocele noted.  Cuff well supported. 2. DEXA 2013 normal.  Recommended follow-up DEXA now and she agrees to arrange.  Risks of following in fracture to include hip fracture discussed. 3. Colonoscopy 2011.  Colon screening per her primary providers recommendation. 4. Mammography 04/2018.  Continue with annual mammography when due.  Breast exam normal today. 5. Pap smear 2012.  No Pap smear done today.  We  both agree to stop screening per current screening guidelines. 6. Health maintenance.  No routine lab work done as patient does this elsewhere.  Follow-up as needed.   Anastasio Auerbach MD, 3:01 PM 12/20/2018

## 2018-12-20 NOTE — Patient Instructions (Signed)
Follow-up for the bone density as scheduled. 

## 2019-01-03 ENCOUNTER — Other Ambulatory Visit: Payer: Self-pay | Admitting: Gynecology

## 2019-01-03 DIAGNOSIS — M810 Age-related osteoporosis without current pathological fracture: Secondary | ICD-10-CM

## 2019-01-03 HISTORY — DX: Age-related osteoporosis without current pathological fracture: M81.0

## 2019-01-07 DIAGNOSIS — H6123 Impacted cerumen, bilateral: Secondary | ICD-10-CM | POA: Diagnosis not present

## 2019-01-07 DIAGNOSIS — H903 Sensorineural hearing loss, bilateral: Secondary | ICD-10-CM | POA: Diagnosis not present

## 2019-01-08 ENCOUNTER — Ambulatory Visit (INDEPENDENT_AMBULATORY_CARE_PROVIDER_SITE_OTHER): Payer: Medicare Other | Admitting: Endocrinology

## 2019-01-08 ENCOUNTER — Encounter: Payer: Self-pay | Admitting: Endocrinology

## 2019-01-08 ENCOUNTER — Other Ambulatory Visit: Payer: Self-pay

## 2019-01-08 VITALS — BP 130/70 | HR 112 | Ht <= 58 in | Wt 144.4 lb

## 2019-01-08 DIAGNOSIS — Z23 Encounter for immunization: Secondary | ICD-10-CM

## 2019-01-08 DIAGNOSIS — E1165 Type 2 diabetes mellitus with hyperglycemia: Secondary | ICD-10-CM | POA: Diagnosis not present

## 2019-01-08 DIAGNOSIS — I7025 Atherosclerosis of native arteries of other extremities with ulceration: Secondary | ICD-10-CM | POA: Diagnosis not present

## 2019-01-08 LAB — POCT GLYCOSYLATED HEMOGLOBIN (HGB A1C): Hemoglobin A1C: 6.6 % — AB (ref 4.0–5.6)

## 2019-01-08 NOTE — Progress Notes (Signed)
[name], female   DOB: February 05, 1928, 85 y.o.   MRN: [id number]   Reason for Appointment: Diabetes follow-up   History of Present Illness   Diagnosis: Type 2 DIABETES MELITUS, date of diagnosis: 1991   She has had mild diabetes for several years and has been on Actos only since about 2001 Her A1c has generally been around or under 7% She was given a trial of metformin because of her difficulty with weight loss and also edema but she felt that it made her blood sugars go up and she will not try it again Also she thinks her blood sugars were higher with a trial of Januvia  RECENT history:  A1c has been variable ranging from 6.1- 6.7 It is now 6.6  Oral hypoglycemic drugs: Actos 30 mg daily  Current blood sugar patterns, day-to-day management and problems identified:   She was started on Januvia in 10/19 because of hyperglycemia on Actos alone but she is stopped it since her last visit in June because of cost  As before she checks her blood sugars infrequently at home and does not bring her meter for confirmation  She thinks that only when she is eating a lot of carbohydrates such as last night her blood sugar may be as high as 173  Otherwise readings are usually 120s-130  No edema with Actos  Her weight is about the same recently  She does go off her diet a [place] at times with fried food or more carbohydrates         Side effects from medications: None  Monitors blood glucose: ?  Frequency .    Glucometer: One Touch.           Blood Glucose readings at home as above          Meals: 2  meals per day usually Physical activity:  minimal            Dietician visit: Most recent: Several years ago             Wt Readings from Last 3 Encounters:  01/08/19 144 lb 6.4 oz (65.5 kg)  12/20/18 143 lb (64.9 kg)  12/13/18 143 lb (64.9 kg)   Lab Results  Component Value Date   HGBA1C 6.6 (A) 01/08/2019   HGBA1C 6.7 (A) 09/04/2018   HGBA1C 6.1  (A) 04/11/2018   Lab Results  Component Value Date   MICROALBUR <0.7 09/04/2018   LDLCALC 51 09/04/2018   CREATININE 0.86 09/04/2018     Allergies as of 01/08/2019      Reactions   Metformin And Related Diarrhea      Medication List       Accurate as of January 08, 2019  4:02 PM. If you have any questions, ask your nurse or doctor.        acetaminophen 500 MG tablet Commonly known as: TYLENOL Take 500 mg by mouth every 6 (six) hours as needed for mild pain.   Align 4 MG Caps Take 1 capsule (4 mg total) by mouth daily.   ALPRAZolam 1 MG tablet Commonly known as: XANAX Take 0.5 mg by mouth 4 (four) times daily as needed for anxiety.   apixaban 5 MG Tabs tablet Commonly known as: Eliquis Take 1 tablet (5 mg total) by mouth 2 (two) times daily.   collagenase ointment Commonly known as: SANTYL Apply topically daily.   diclofenac sodium 1 % Gel Commonly known as: VOLTAREN Apply 1 application  topically 3 (three) times daily.   furosemide 20 MG tablet Commonly known as: LASIX Take 0.5 tablets (10 mg total) by mouth daily.   glucosamine-chondroitin 500-400 MG tablet Take 1 tablet by mouth daily.   glucose blood test strip Commonly known as: ONE TOUCH ULTRA TEST USE AS INSTRUCTED TO CHECK BLOOD SUGAR ONCE A DAY Dx code: E11.9   OneTouch Verio test strip Generic drug: glucose blood USE TO TEST BLOOD SUGAR ONCE DAILY   HYDROcodone-acetaminophen 5-325 MG tablet Commonly known as: NORCO/VICODIN Take 0.5 tablets by mouth every 6 (six) hours as needed for moderate pain.   loperamide 2 MG capsule Commonly known as: IMODIUM Take 2 mg by mouth 4 (four) times daily as needed for diarrhea or loose stools.   loratadine 10 MG tablet Commonly known as: CLARITIN Take 10 mg by mouth daily as needed for allergies.   metoprolol tartrate 25 MG tablet Commonly known as: LOPRESSOR TAKE ONE AND HALF TABLETS (37.5 MG TOTAL) BY MOUTH 2 (TWO) TIMES DAILY.   multivitamin tablet  Take 1 tablet by mouth daily. Patient reports takes a new vitamin to replace her centrum as recommended by her eye doctor.   nitroGLYCERIN 0.4 MG SL tablet Commonly known as: NITROSTAT Place 1 tablet (0.4 mg total) under the tongue every 5 (five) minutes as needed for chest pain.   nystatin cream Commonly known as: MYCOSTATIN Apply 1 application topically 2 (two) times daily.   nystatin-triamcinolone ointment Commonly known as: MYCOLOG APPLY TO AFFECTED AREA TWICE A DAY AS NEEDED FOR ITCHING   pantoprazole 40 MG tablet Commonly known as: PROTONIX TAKE 1 TABLET BY MOUTH EVERY DAY   pioglitazone 30 MG tablet Commonly known as: ACTOS TAKE 1 TABLET BY MOUTH EVERY DAY   polyethylene glycol powder 17 GM/SCOOP powder Commonly known as: GLYCOLAX/MIRALAX Take 17 g by mouth daily. Hold for diarrhea   pravastatin 20 MG tablet Commonly known as: PRAVACHOL TAKE 1 TABLET BY MOUTH EVERY DAY   ramipril 5 MG capsule Commonly known as: ALTACE TAKE ONE CAPSULE BY MOUTH EVERYDAY FOR BLOOD PRESSURE   senna-docusate 8.6-50 MG tablet Commonly known as: Senokot-S Take 1 tablet by mouth daily as needed for moderate constipation.   traZODone 150 MG tablet Commonly known as: DESYREL Take 150 mg by mouth at bedtime.   triamcinolone cream 0.1 % Commonly known as: KENALOG APPLY TO AFFECTED AREA TWICE A DAY   vitamin B-12 1000 MCG tablet Commonly known as: CYANOCOBALAMIN Take 1,000 mcg by mouth daily.   vitamin E 400 UNIT capsule Take 400 Units by mouth daily.       Allergies:  Allergies  Allergen Reactions  . Metformin And Related Diarrhea    Past Medical History:  Diagnosis Date  . Acute deep vein thrombosis (DVT) of distal vein of right lower extremity ([place]) 12/2013   Acute thrombus of R proximal gastrocnemius vein. Symptomatic provoked distal DVT  . Anxiety   . Cellulitis of leg, left 01/16/2014  . Chronic back pain 03/2012   lumbar DDD with herniation - s/p L S1  transforaminal ESI (10/2012 Dr. Sharlet Salina)  . Chronic venous insufficiency   . Coronary artery disease    CABG 1998  . Cystocele   . Depression   . Diabetes mellitus    type 2, followed by endo.  . Diastolic dysfunction   . Diverticulosis   . Hemorrhoids   . History of heart attack   . Hx of echocardiogram    a. Echo 10/13: mild LVH, EF  55-60%, Gr 1 diast dysfn, PASP 32  . Hyperlipidemia   . Hypertension   . Irritable bowel syndrome   . Microcytic anemia   . PAD (peripheral artery disease) ([place]) 2013   ABI: R 0.91, L 0.83  . PUD (peptic ulcer disease)   . Varicose veins   . Venous ulcer of leg ([place]) 12/12/2013   Required prolonged treatment by wound center  . Viral gastroenteritis due to [place] virus 05/13/2015   hospitalization    Past Surgical History:  Procedure Laterality Date  . ABDOMINAL HYSTERECTOMY  1975   TAH,BSO  . ABI  2013   R 0.91, L 0.83  . APPENDECTOMY     per pt report  . CARDIOVASCULAR STRESS TEST  2015   low risk myoview ([place])  . CATARACT SURGERY    . CHOLECYSTECTOMY    . COLONOSCOPY  02/2010   extensive diverticulosis throughout, small internal hemorrhoids, rec rpt 5 yrs (Dr. Allyn Kenner)  . CORONARY ARTERY BYPASS GRAFT  1998  . dexa scan  09/2011   femur -0.8, forearm 0.6 => normal  . ENDOVENOUS ABLATION SAPHENOUS VEIN W/ LASER Left 03/2014   Kellie Simmering  . ESI  2014   L S1 transforaminal ESI x2 ([place]) without improvement  . HAND SURGERY    . lexiscan myoview  03/2011   Small basal inferolateral and anterolateral reversible perfusion defect suggests ischemia.  EF was normal.   old infarct, no new ischemia  . LOWER EXTREMITY ANGIOGRAPHY Right 09/06/2018   Procedure: LOWER EXTREMITY ANGIOGRAPHY;  Surgeon: Algernon Huxley, MD;  Location: [facility];  Service: Cardiovascular;  Laterality: Right;  . toe surgery    . TYMPANOPLASTY  1960s   R side  . Vault prolapse A & P repair  2009   Dr Rhodia Albright [facility] hospital    Family History  Problem Relation  Age of Onset  . Hypertension Mother   . Stroke Mother   . Diabetes Father   . Diabetes Sister   . Diabetes Brother   . Heart disease Son   . Colon cancer Neg Hx     Social History:  reports that she quit smoking about 39 years ago. She has never used smokeless tobacco. She reports that she does not drink alcohol or use drugs.  Review of Systems:  No history of thyroid disease, she says she continues to feel bad  Lab Results  Component Value Date   TSH 2.63 09/04/2018   TSH 2.41 10/26/2016   TSH 2.71 12/30/2015   [name]4 0.91 10/26/2016     HYPERTENSION:  well-controlled on Ramipril 5 mg Although her blood pressure is okay she says that rarely she feels like she is going to pass out  BP Readings from Last 3 Encounters:  01/08/19 130/70  12/20/18 126/82  12/13/18 126/74    HYPERLIPIDEMIA: The lipid abnormality consists of elevated LDL treated with pravastatin This is followed by cardiologist and PCP Last LDL below 70  Lab Results  Component Value Date   CHOL 122 09/04/2018   HDL 50.20 09/04/2018   LDLCALC 51 09/04/2018   TRIG 104.0 09/04/2018   CHOLHDL 2 09/04/2018    Last foot exam in 1/20      Examination:   BP 130/70 (BP Location: Left Arm, Patient Position: Sitting, Cuff Size: Normal)   Pulse (!) 112   Ht 4\' 9"  (1.448 m)   Wt 144 lb 6.4 oz (65.5 kg)   SpO2 97%   BMI 31.25 kg/m  Body mass index is 31.25 kg/m.   No ankle edema present  ASSESSMENT/ PLAN:   DIABETES type 2 with obesity  A1c is 6.6 which is still adequate for her age  She is not taking Januvia since she could not afford it  Continues to be only on Actos which is generic As before she does not check her sugars monitor bring her monitor for download However considering her age and multiple medical problems her A1c is excellent and she is tolerating Actos well without any edema  She will follow-up in 4 months  Follow-up with PCP and cardiologist regarding ongoing problems  including occasional symptoms of near syncope and lab work as needed   There are no Patient Instructions on file for this visit.  High-dose influenza vaccine given  Elayne Snare 01/08/2019, 4:02 PM      Note: This office note was prepared with Dragon voice recognition system technology. Any transcriptional errors that result from this process are unintentional.

## 2019-01-09 ENCOUNTER — Other Ambulatory Visit: Payer: Self-pay

## 2019-01-10 ENCOUNTER — Ambulatory Visit (INDEPENDENT_AMBULATORY_CARE_PROVIDER_SITE_OTHER): Payer: Medicare Other

## 2019-01-10 ENCOUNTER — Ambulatory Visit: Payer: BLUE CROSS/BLUE SHIELD | Admitting: Cardiovascular Disease

## 2019-01-10 ENCOUNTER — Other Ambulatory Visit: Payer: Self-pay | Admitting: Gynecology

## 2019-01-10 ENCOUNTER — Encounter: Payer: Self-pay | Admitting: Gynecology

## 2019-01-10 DIAGNOSIS — Z01419 Encounter for gynecological examination (general) (routine) without abnormal findings: Secondary | ICD-10-CM

## 2019-01-10 DIAGNOSIS — M810 Age-related osteoporosis without current pathological fracture: Secondary | ICD-10-CM

## 2019-01-10 DIAGNOSIS — Z780 Asymptomatic menopausal state: Secondary | ICD-10-CM

## 2019-01-11 ENCOUNTER — Encounter: Payer: Self-pay | Admitting: Gynecology

## 2019-01-14 ENCOUNTER — Other Ambulatory Visit: Payer: Self-pay | Admitting: Family Medicine

## 2019-01-14 MED ORDER — HYDROCODONE-ACETAMINOPHEN 5-325 MG PO TABS: 0.5000 | ORAL_TABLET | Freq: Four times a day (QID) | ORAL | 0 refills | Status: DC | PRN

## 2019-01-14 NOTE — Telephone Encounter (Signed)
°  Patient requesting a refill  Hydrocodone 5-325Mg    Patient has enough for today and half a day tomorrow    [facility] [address]

## 2019-01-14 NOTE — Telephone Encounter (Signed)
Name of Medication: Hydrocodone apap 5-325 mg Name of Pharmacy: [facility] or Written Date and Quantity: #60 on 12/05/18 Last Office Visit and Type: 11/14/18 leg pain Next Office Visit and Type:02/01/19 annual  Last Controlled Substance Agreement Date: 10/16/2017 Last UDS:10/11/2017

## 2019-01-14 NOTE — Telephone Encounter (Signed)
ERx 

## 2019-01-15 ENCOUNTER — Ambulatory Visit (INDEPENDENT_AMBULATORY_CARE_PROVIDER_SITE_OTHER): Payer: Medicare Other

## 2019-01-15 ENCOUNTER — Other Ambulatory Visit: Payer: Self-pay

## 2019-01-15 ENCOUNTER — Ambulatory Visit (INDEPENDENT_AMBULATORY_CARE_PROVIDER_SITE_OTHER): Payer: Medicare Other | Admitting: Vascular Surgery

## 2019-01-15 DIAGNOSIS — I7025 Atherosclerosis of native arteries of other extremities with ulceration: Secondary | ICD-10-CM

## 2019-01-16 ENCOUNTER — Other Ambulatory Visit: Payer: Self-pay

## 2019-01-16 DIAGNOSIS — L570 Actinic keratosis: Secondary | ICD-10-CM | POA: Diagnosis not present

## 2019-01-16 DIAGNOSIS — Z85828 Personal history of other malignant neoplasm of skin: Secondary | ICD-10-CM | POA: Diagnosis not present

## 2019-01-16 DIAGNOSIS — L905 Scar conditions and fibrosis of skin: Secondary | ICD-10-CM | POA: Diagnosis not present

## 2019-01-16 DIAGNOSIS — D1801 Hemangioma of skin and subcutaneous tissue: Secondary | ICD-10-CM | POA: Diagnosis not present

## 2019-01-16 DIAGNOSIS — L821 Other seborrheic keratosis: Secondary | ICD-10-CM | POA: Diagnosis not present

## 2019-01-16 MED ORDER — ONETOUCH VERIO VI STRP: ORAL_STRIP | 6 refills | Status: DC

## 2019-01-21 ENCOUNTER — Other Ambulatory Visit: Payer: Self-pay

## 2019-01-21 MED ORDER — ONETOUCH VERIO VI STRP: ORAL_STRIP | 6 refills | Status: DC

## 2019-01-22 ENCOUNTER — Encounter: Payer: Self-pay | Admitting: Physician Assistant

## 2019-01-22 ENCOUNTER — Other Ambulatory Visit: Payer: Self-pay

## 2019-01-22 ENCOUNTER — Ambulatory Visit (INDEPENDENT_AMBULATORY_CARE_PROVIDER_SITE_OTHER): Payer: Medicare Other | Admitting: Physician Assistant

## 2019-01-22 VITALS — BP 124/66 | HR 106 | Ht <= 58 in | Wt 144.0 lb

## 2019-01-22 DIAGNOSIS — I4819 Other persistent atrial fibrillation: Secondary | ICD-10-CM

## 2019-01-22 DIAGNOSIS — I7025 Atherosclerosis of native arteries of other extremities with ulceration: Secondary | ICD-10-CM

## 2019-01-22 DIAGNOSIS — I25119 Atherosclerotic heart disease of native coronary artery with unspecified angina pectoris: Secondary | ICD-10-CM

## 2019-01-22 DIAGNOSIS — E785 Hyperlipidemia, unspecified: Secondary | ICD-10-CM

## 2019-01-22 DIAGNOSIS — I10 Essential (primary) hypertension: Secondary | ICD-10-CM | POA: Diagnosis not present

## 2019-01-22 MED ORDER — METOPROLOL TARTRATE 25 MG PO TABS: 37.5000 mg | ORAL_TABLET | Freq: Two times a day (BID) | ORAL | 3 refills | Status: DC

## 2019-01-22 NOTE — Progress Notes (Signed)
Cardiology Office Note:    Date:  01/22/2019   ID:  [name], DOB 02-Mar-1928, MRN [id number]  PCP:  Ria Bush, MD  Cardiologist:  Mertie Moores, MD  Electrophysiologist:  None   Referring MD: Ria Bush, MD   Chief Complaint  Patient presents with  . Follow-up    AFib    History of Present Illness:    [name] is a 85 y.o. female with:   Coronary artery disease  S/p CABG in 1996; postop atrial fibrillation  Cath in 2005 with patent grafts  Myoview 11/15: No ischemia  Atrial fibrillation, persistent  CHADS2-VASc=6 (Diab, HTN, CAD, age x 2, female)   Diabetes mellitus  Hypertension  Hyperlipidemia  Venous insufficiency  History of right DVT  Peripheral arterial disease  Status post cholecystectomy  Right leg diabetic wound, followed by wound clinic  Ms. [name] was last seen by Dr. Acie Fredrickson in September 2020.  She was noted be back in atrial fibrillation.  She was felt to be symptomatic.  She was placed on Apixaban for anticoagulation.  She is brought back today with an eye towards cardioversion if she remains in atrial fibrillation.    She is here alone today.  She ambulates with a walker.  She describes shortness of breath at times.  However, she notes that when she is somewhat active (e.g. planting flowers) she does not notice shortness of breath.  She also describes chest pain.  She is tearful at times when she talks about her son who passed away 2 years ago.  She notes issues increased stress at home and the man that lives with her.  She seems to have chest pain with emotional stress.  She also notes chest pain at times when she is active.  She has not taken NTG.  Her chest pain only lasts a few seconds.  She has not had syncope, orthopnea.    Prior CV studies:   The following studies were reviewed today:  ABIs 01/15/2019 +-------+-----------+-----------+------------+------------+ ABI/TBIToday's ABIToday's TBIPrevious  ABIPrevious TBI +-------+-----------+-----------+------------+------------+ Right  .95        .90        .67         0            +-------+-----------+-----------+------------+------------+ Left   .71        .58        .65         0            +-------+-----------+-----------+------------+------------+ Bilateral ABIs appear increased compared to prior study on 06/05/2017. Bilateral TBIs appear increased compared to prior study on 06/05/2017.   Summary: Right: Resting right ankle-brachial index is within normal range. No evidence of significant right lower extremity arterial disease. The right toe-brachial index is normal.  Left: Resting left ankle-brachial index indicates moderate left lower extremity arterial disease. The left toe-brachial index is abnormal.  Echocardiogram 09/28/2015 Moderate concentric LVH, normal systolic function, normal wall motion, trivial AI, MAC, trivial MR, mild LAE  Myoview 02/19/2014 Low risk stress nuclear study with a medium sized, moderately intense fixed defect in the basal and mid lateral wall.  In the setting of normal LVF, this most likely represents breast attenuation artifact.  No inducible ischemia is noted.  LV Ejection Fraction: 67%.  Cardiac catheterization 12/15/2003 LAD prox 40-50, then 100 OM1 100 RCA prox 50, mid 20-30, dist 90 S-RCA patent S-OM1 patent L-LAD patent EF 60-65   Past Medical History:  Diagnosis Date  .  Acute deep vein thrombosis (DVT) of distal vein of right lower extremity ([place]) 12/2013   Acute thrombus of R proximal gastrocnemius vein. Symptomatic provoked distal DVT  . Anxiety   . Cellulitis of leg, left 01/16/2014  . Chronic back pain 03/2012   lumbar DDD with herniation - s/p L S1 transforaminal ESI (10/2012 Dr. Sharlet Salina)  . Chronic venous insufficiency   . Coronary artery disease    CABG 1998  . Cystocele   . Depression   . Diabetes mellitus    type 2, followed by endo.  . Diastolic dysfunction    . Diverticulosis   . Hemorrhoids   . History of heart attack   . Hx of echocardiogram    a. Echo 10/13: mild LVH, EF 55-60%, Gr 1 diast dysfn, PASP 32  . Hyperlipidemia   . Hypertension   . Irritable bowel syndrome   . Microcytic anemia   . PAD (peripheral artery disease) ([place]) 2013   ABI: R 0.91, L 0.83  . Postmenopausal osteoporosis 01/2019   T score of -2.6  . PUD (peptic ulcer disease)   . Varicose veins   . Venous ulcer of leg ([place]) 12/12/2013   Required prolonged treatment by wound center  . Viral gastroenteritis due to [place] virus 05/13/2015   hospitalization   Surgical Hx: The patient  has a past surgical history that includes Tympanoplasty (1960s); Coronary artery bypass graft (1998); CATARACT SURGERY; Cholecystectomy; Appendectomy; Colonoscopy (02/2010); Hand surgery; lexiscan myoview (03/2011); toe surgery; Abdominal hysterectomy (1975); Vault prolapse A & P repair (2009); dexa scan (09/2011); ESI (2014); ABI (2013); Endovenous ablation saphenous vein w/ laser (Left, 03/2014); Cardiovascular stress test (2015); and Lower Extremity Angiography (Right, 09/06/2018).   Current Medications: Current Meds  Medication Sig  . acetaminophen (TYLENOL) 500 MG tablet Take 500 mg by mouth every 6 (six) hours as needed for mild pain.  Marland Kitchen ALPRAZolam (XANAX) 1 MG tablet Take 0.5 mg by mouth 4 (four) times daily as needed for anxiety.   Marland Kitchen apixaban (ELIQUIS) 5 MG TABS tablet Take 1 tablet (5 mg total) by mouth 2 (two) times daily.  . collagenase (SANTYL) ointment Apply topically daily.  . diclofenac sodium (VOLTAREN) 1 % GEL Apply 1 application topically 3 (three) times daily.  . furosemide (LASIX) 20 MG tablet Take 0.5 tablets (10 mg total) by mouth daily.  Marland Kitchen glucosamine-chondroitin 500-400 MG tablet Take 1 tablet by mouth daily.   Marland Kitchen glucose blood (ONETOUCH VERIO) test strip USE TO TEST BLOOD SUGAR ONCE DAILY. DX:E11.9  . HYDROcodone-acetaminophen (NORCO/VICODIN) 5-325 MG tablet Take 0.5  tablets by mouth every 6 (six) hours as needed for moderate pain.  Marland Kitchen loperamide (IMODIUM) 2 MG capsule Take 2 mg by mouth 4 (four) times daily as needed for diarrhea or loose stools.  Marland Kitchen loratadine (CLARITIN) 10 MG tablet Take 10 mg by mouth daily as needed for allergies.  . Multiple Vitamin (MULTIVITAMIN) tablet Take 1 tablet by mouth daily. Patient reports takes a new vitamin to replace her centrum as recommended by her eye doctor.  . nitroGLYCERIN (NITROSTAT) 0.4 MG SL tablet Place 1 tablet (0.4 mg total) under the tongue every 5 (five) minutes as needed for chest pain.  Marland Kitchen nystatin cream (MYCOSTATIN) Apply 1 application topically 2 (two) times daily.  Marland Kitchen nystatin-triamcinolone ointment (MYCOLOG) APPLY TO AFFECTED AREA TWICE A DAY AS NEEDED FOR ITCHING  . pantoprazole (PROTONIX) 40 MG tablet TAKE 1 TABLET BY MOUTH EVERY DAY  . pioglitazone (ACTOS) 30 MG tablet TAKE 1 TABLET  BY MOUTH EVERY DAY  . polyethylene glycol powder (GLYCOLAX/MIRALAX) powder Take 17 g by mouth daily. Hold for diarrhea  . pravastatin (PRAVACHOL) 20 MG tablet TAKE 1 TABLET BY MOUTH EVERY DAY  . Probiotic Product (ALIGN) 4 MG CAPS Take 1 capsule (4 mg total) by mouth daily.  . ramipril (ALTACE) 5 MG capsule TAKE ONE CAPSULE BY MOUTH EVERYDAY FOR BLOOD PRESSURE  . traZODone (DESYREL) 150 MG tablet Take 150 mg by mouth at bedtime.   . triamcinolone cream (KENALOG) 0.1 % APPLY TO AFFECTED AREA TWICE A DAY  . vitamin B-12 (CYANOCOBALAMIN) 1000 MCG tablet Take 1,000 mcg by mouth daily.  . vitamin E 400 UNIT capsule Take 400 Units by mouth daily.  . [DISCONTINUED] metoprolol tartrate (LOPRESSOR) 25 MG tablet Take 25 mg by mouth 2 (two) times daily.     Allergies:   Metformin and related   Social History   Tobacco Use  . Smoking status: Former Smoker    Quit date: 03/05/1979    Years since quitting: 39.9  . Smokeless tobacco: Never Used  Substance Use Topics  . Alcohol use: No    Alcohol/week: 0.0 standard drinks  . Drug  use: No     Family Hx: The patient's family history includes Diabetes in her brother, father, and sister; Heart disease in her son; Hypertension in her mother; Stroke in her mother. There is no history of Colon cancer.  ROS:   Please see the history of present illness.    ROS All other systems reviewed and are negative.   EKGs/Labs/Other Test Reviewed:    EKG:  EKG is  ordered today.  The ekg ordered today demonstrates atrial fibrillation, HR 97, leftward axis, QTc 429, no change from prior tracing.   Recent Labs: 06/21/2018: Hemoglobin 13.2; Platelets 233 09/04/2018: ALT 15; BUN 20; Creatinine, Ser 0.86; Potassium 4.6; Sodium 138; TSH 2.63   Recent Lipid Panel Lab Results  Component Value Date/Time   CHOL 122 09/04/2018 01:46 PM   TRIG 104.0 09/04/2018 01:46 PM   HDL 50.20 09/04/2018 01:46 PM   CHOLHDL 2 09/04/2018 01:46 PM   LDLCALC 51 09/04/2018 01:46 PM    Physical Exam:    VS:  BP 124/66   Pulse (!) 106   Ht _0  (1.448 m)   Wt 144 lb (65.3 kg)   SpO2 99%   BMI 31.16 kg/m     Wt Readings from Last 3 Encounters:  01/22/19 144 lb (65.3 kg)  01/08/19 144 lb 6.4 oz (65.5 kg)  12/20/18 143 lb (64.9 kg)     Physical Exam  Constitutional: She is oriented to person, place, and time. She appears well-developed and well-nourished. No distress.  HENT:  Head: Normocephalic and atraumatic.  Eyes: No scleral icterus.  Neck: No thyromegaly present.  Cardiovascular: Normal rate. An irregularly irregular rhythm present.  No murmur heard. Pulmonary/Chest: Effort normal. She has no rales.  Abdominal: Soft.  Musculoskeletal:        General: No edema.  Lymphadenopathy:    She has no cervical adenopathy.  Neurological: She is alert and oriented to person, place, and time.  Skin: Skin is warm and dry.  Psychiatric: She has a normal mood and affect.    ASSESSMENT & PLAN:    1. Persistent atrial fibrillation ([place]) She remains in atrial fibrillation today.  We discussed  risks and benefits of proceeding with direct-current cardioversion.  Apparently, her son passed away after cardiac catheterization 2 years ago.  She is hesitant  to proceed with any cardiac procedure.  At this time, she has not ready to agree to proceed.  It is difficult to know if she is symptomatic.  She seems to describe shortness of breath at times but not at other times.  She does feel her heart rate increasing at times.  We discussed the importance of continuing anticoagulation for stroke prevention.  -Continue Apixaban (Eliquis)  -Increase metoprolol tartrate to 37.5 mg twice daily for better rate control  -I will provide her with literature regarding cardioversion  -Follow-up with Dr. Acie Fredrickson or me in 4 weeks to revisit +/- proceeding with cardioversion  2. Coronary artery disease involving native coronary artery of native heart with angina pectoris ([place]) History of CABG in 1996.  Cardiac catheterization in 2005 with patent bypass grafts.  Myoview in 2015 negative for ischemia.  She has episodes of chest discomfort with both emotional stress and physical stress.  It is not clear to me if these are truly anginal symptoms.  We discussed the possibility of proceeding with stress testing versus cardiac catheterization.  She would not want to pursue either test and prefers medical Rx.    -Increase beta-blocker therapy as noted above  -Continue pravastatin  -She is no longer on ASA as she is on Apixaban   3. Essential hypertension The patient's blood pressure is controlled on her current regimen.  Continue current therapy.    4. Hyperlipidemia, unspecified hyperlipidemia type Continue statin Rx.    Dispo:  Return in about 4 weeks (around 02/19/2019) for Routine Follow Up, w/ Dr. Acie Fredrickson, or [name], PA-C, in person.   Medication Adjustments/Labs and Tests Ordered: Current medicines are reviewed at length with the patient today.  Concerns regarding medicines are outlined above.  Tests  Ordered: Orders Placed This Encounter  Procedures  . EKG 12-Lead   Medication Changes: Meds ordered this encounter  Medications  . metoprolol tartrate (LOPRESSOR) 25 MG tablet    Sig: Take 1.5 tablets (37.5 mg total) by mouth 2 (two) times daily.    Dispense:  270 tablet    Refill:  3    Signed, [name], PA-C  01/22/2019 3:24 PM    [facility] [place], [place], [place]  [address] Phone: [phone]; Fax: [phone]

## 2019-01-22 NOTE — Patient Instructions (Signed)
Medication Instructions:  Your physician has recommended you make the following change in your medication:  1.  INCREASE the Metoprolol (Lopressor) to 1 1/2 tablet by mouth twice a day  *If you need a refill on your cardiac medications before your next appointment, please call your pharmacy*  Lab Work: None ordered  If you have labs (blood work) drawn today and your tests are completely normal, you will receive your results only by: Marland Kitchen MyChart Message (if you have MyChart) OR . A paper copy in the mail If you have any lab test that is abnormal or we need to change your treatment, we will call you to review the results.  Testing/Procedures: None ordered  Follow-Up: At [facility], you and your health needs are our priority.  As part of our continuing mission to provide you with exceptional heart care, we have created designated Provider Care Teams.  These Care Teams include your primary Cardiologist (physician) and Advanced Practice Providers (APPs -  Physician Assistants and Nurse Practitioners) who all work together to provide you with the care you need, when you need it.  Your next appointment:   02/26/2019 ARRIVE AT 1:30 TO SEE SCOTT WEAVER, PA-C  The format for your next appointment:   In Person    Other Instructions   Electrical Cardioversion  Electrical cardioversion is the delivery of a jolt of electricity to restore a normal rhythm to the heart. A rhythm that is too fast or is not regular keeps the heart from pumping well. In this procedure, sticky patches or metal paddles are placed on the chest to deliver electricity to the heart from a device. This procedure may be done in an emergency if:  There is low or no blood pressure as a result of the heart rhythm.  Normal rhythm must be restored as fast as possible to protect the brain and heart from further damage.  It may save a life. This procedure may also be done for irregular or fast heart rhythms that are not  immediately life-threatening. Tell a health care provider about:  Any allergies you have.  All medicines you are taking, including vitamins, herbs, eye drops, creams, and over-the-counter medicines.  Any problems you or family members have had with anesthetic medicines.  Any blood disorders you have.  Any surgeries you have had.  Any medical conditions you have.  Whether you are pregnant or may be pregnant. What are the risks? Generally, this is a safe procedure. However, problems may occur, including:  Allergic reactions to medicines.  A blood clot that breaks free and travels to other parts of your body.  The possible return of an abnormal heart rhythm within hours or days after the procedure.  Your heart stopping (cardiac arrest). This is rare. What happens before the procedure? Medicines  Your health care provider may have you start taking: ? Blood-thinning medicines (anticoagulants) so your blood does not clot as easily. ? Medicines may be given to help stabilize your heart rate and rhythm.  Ask your health care provider about changing or stopping your regular medicines. This is especially important if you are taking diabetes medicines or blood thinners. General instructions  Plan to have someone take you home from the hospital or clinic.  If you will be going home right after the procedure, plan to have someone with you for 24 hours.  Follow instructions from your health care provider about eating or drinking restrictions. What happens during the procedure?  To lower your risk of  infection: ? Your health care team will wash or sanitize their hands. ? Your skin will be washed with soap.  An IV tube will be inserted into one of your veins.  You will be given a medicine to help you relax (sedative).  Sticky patches (electrodes) or metal paddles may be placed on your chest.  An electrical shock will be delivered. The procedure may vary among health care  providers and hospitals. What happens after the procedure?   Your blood pressure, heart rate, breathing rate, and blood oxygen level will be monitored until the medicines you were given have worn off.  Do not drive for 24 hours if you were given a sedative.  Your heart rhythm will be watched to make sure it does not change. This information is not intended to replace advice given to you by your health care provider. Make sure you discuss any questions you have with your health care provider. Document Released: 03/11/2002 Document Revised: 03/03/2017 Document Reviewed: 09/25/2015 Elsevier Patient Education  2020 [employer].

## 2019-01-25 NOTE — Telephone Encounter (Signed)
Patient informed with below, patient said she is having issues with her heart and will follow up once she gets this taking care.

## 2019-01-28 ENCOUNTER — Other Ambulatory Visit: Payer: Self-pay | Admitting: Internal Medicine

## 2019-02-01 ENCOUNTER — Encounter: Payer: Medicare Other | Admitting: Family Medicine

## 2019-02-01 ENCOUNTER — Ambulatory Visit: Payer: Medicare Other

## 2019-02-02 ENCOUNTER — Other Ambulatory Visit: Payer: Self-pay | Admitting: Family Medicine

## 2019-02-03 ENCOUNTER — Other Ambulatory Visit: Payer: Self-pay | Admitting: Family Medicine

## 2019-02-03 DIAGNOSIS — E1151 Type 2 diabetes mellitus with diabetic peripheral angiopathy without gangrene: Secondary | ICD-10-CM

## 2019-02-03 DIAGNOSIS — D509 Iron deficiency anemia, unspecified: Secondary | ICD-10-CM

## 2019-02-03 DIAGNOSIS — E785 Hyperlipidemia, unspecified: Secondary | ICD-10-CM

## 2019-02-03 DIAGNOSIS — E1149 Type 2 diabetes mellitus with other diabetic neurological complication: Secondary | ICD-10-CM

## 2019-02-04 ENCOUNTER — Other Ambulatory Visit (INDEPENDENT_AMBULATORY_CARE_PROVIDER_SITE_OTHER): Payer: Medicare Other

## 2019-02-04 DIAGNOSIS — D509 Iron deficiency anemia, unspecified: Secondary | ICD-10-CM

## 2019-02-04 DIAGNOSIS — E785 Hyperlipidemia, unspecified: Secondary | ICD-10-CM | POA: Diagnosis not present

## 2019-02-04 LAB — LIPID PANEL
Cholesterol: 115 mg/dL (ref 0–200)
HDL: 47.2 mg/dL (ref >39.00)
LDL Cholesterol: 56 mg/dL (ref 0–99)
NonHDL: 68.21
Total CHOL/HDL Ratio: 2
Triglycerides: 60 mg/dL (ref 0.0–149.0)
VLDL: 12 mg/dL (ref 0.0–40.0)

## 2019-02-04 LAB — COMPREHENSIVE METABOLIC PANEL
ALT: 14 U/L (ref 0–35)
AST: 17 U/L (ref 0–37)
Albumin: 3.9 g/dL (ref 3.5–5.2)
Alkaline Phosphatase: 113 U/L (ref 39–117)
BUN: 25 mg/dL — ABNORMAL HIGH (ref 6–23)
CO2: 31 mEq/L (ref 19–32)
Calcium: 9.3 mg/dL (ref 8.4–10.5)
Chloride: 102 mEq/L (ref 96–112)
Creatinine, Ser: 0.98 mg/dL (ref 0.40–1.20)
GFR: 53.36 mL/min — ABNORMAL LOW (ref >60.00)
Glucose, Bld: 94 mg/dL (ref 70–99)
Potassium: 4.7 mEq/L (ref 3.5–5.1)
Sodium: 140 mEq/L (ref 135–145)
Total Bilirubin: 0.5 mg/dL (ref 0.2–1.2)
Total Protein: 6.3 g/dL (ref 6.0–8.3)

## 2019-02-04 LAB — CBC WITH DIFFERENTIAL/PLATELET
Basophils Absolute: 0.1 10*3/uL (ref 0.0–0.1)
Basophils Relative: 0.7 % (ref 0.0–3.0)
Eosinophils Absolute: 0.2 10*3/uL (ref 0.0–0.7)
Eosinophils Relative: 1.7 % (ref 0.0–5.0)
HCT: 39.3 % (ref 36.0–46.0)
Hemoglobin: 12.8 g/dL (ref 12.0–15.0)
Lymphocytes Relative: 18.4 % (ref 12.0–46.0)
Lymphs Abs: 1.6 10*3/uL (ref 0.7–4.0)
MCHC: 32.6 g/dL (ref 30.0–36.0)
MCV: 92.8 fl (ref 78.0–100.0)
Monocytes Absolute: 0.8 10*3/uL (ref 0.1–1.0)
Monocytes Relative: 9.6 % (ref 3.0–12.0)
Neutro Abs: 6.1 10*3/uL (ref 1.4–7.7)
Neutrophils Relative %: 69.6 % (ref 43.0–77.0)
Platelets: 229 10*3/uL (ref 150.0–400.0)
RBC: 4.24 Mil/uL (ref 3.87–5.11)
RDW: 14.5 % (ref 11.5–15.5)
WBC: 8.8 10*3/uL (ref 4.0–10.5)

## 2019-02-05 ENCOUNTER — Ambulatory Visit (INDEPENDENT_AMBULATORY_CARE_PROVIDER_SITE_OTHER): Payer: Medicare Other

## 2019-02-05 DIAGNOSIS — Z0000 Encounter for general adult medical examination without abnormal findings: Secondary | ICD-10-CM

## 2019-02-05 NOTE — Progress Notes (Signed)
Subjective:   [name] is a 85 y.o. female who presents for Medicare Annual (Subsequent) preventive examination.  Review of Systems: N/A   This visit is being conducted through telemedicine via telephone at the nurse health advisor's home address due to the COVID-19 pandemic. This patient has given me verbal consent via doximity to conduct this visit, patient states they are participating from their home address. Patient and myself are on the telephone call. There is no referral for this visit. Some vital signs may be absent or patient reported.    Patient identification: identified by name, DOB, and current address   Cardiac Risk Factors include: advanced age (>4men, >70 women);diabetes mellitus;hypertension     Objective:     Vitals: There were no vitals taken for this visit.  There is no height or weight on file to calculate BMI.  Advanced Directives 02/05/2019 06/21/2018 03/23/2018 03/23/2018 01/24/2018 11/16/2015 09/27/2015  Does Patient Have a Medical Advance Directive? Yes No Yes No No Yes Yes  Type of Paramedic of Breaks;Living will - [place];Living will - - [place];Living will Living will  Does patient want to make changes to medical advance directive? - - No - Patient declined - No - Patient declined No - Patient declined -  Copy of [place] in Chart? No - copy requested - No - copy requested - - - -  Would patient like information on creating a medical advance directive? - No - Patient declined No - Patient declined - No - Patient declined - -    Tobacco Social History   Tobacco Use  Smoking Status Former Smoker  . Quit date: 03/05/1979  . Years since quitting: 39.9  Smokeless Tobacco Never Used     Counseling given: Not Answered   Clinical Intake:  Pre-visit preparation completed: Yes  Pain : No/denies pain     Nutritional Risks: None Diabetes: Yes CBG done?: No  Did pt. bring in CBG monitor from home?: No  How often do you need to have someone help you when you read instructions, pamphlets, or other written materials from your doctor or pharmacy?: 1 - Never What is the last grade level you completed in school?: 8th  Interpreter Needed?: No  Information entered by :: CJohnson, LPN  Past Medical History:  Diagnosis Date  . Acute deep vein thrombosis (DVT) of distal vein of right lower extremity ([place]) 12/2013   Acute thrombus of R proximal gastrocnemius vein. Symptomatic provoked distal DVT  . Anxiety   . Cellulitis of leg, left 01/16/2014  . Chronic back pain 03/2012   lumbar DDD with herniation - s/p L S1 transforaminal ESI (10/2012 Dr. Sharlet Salina)  . Chronic venous insufficiency   . Coronary artery disease    CABG 1998  . Cystocele   . Depression   . Diabetes mellitus    type 2, followed by endo.  . Diastolic dysfunction   . Diverticulosis   . Hemorrhoids   . History of heart attack   . Hx of echocardiogram    a. Echo 10/13: mild LVH, EF 55-60%, Gr 1 diast dysfn, PASP 32  . Hyperlipidemia   . Hypertension   . Irritable bowel syndrome   . Microcytic anemia   . PAD (peripheral artery disease) ([place]) 2013   ABI: R 0.91, L 0.83  . Postmenopausal osteoporosis 01/2019   T score of -2.6  . PUD (peptic ulcer disease)   . Varicose veins   .  Venous ulcer of leg ([place]) 12/12/2013   Required prolonged treatment by wound center  . Viral gastroenteritis due to [place] virus 05/13/2015   hospitalization   Past Surgical History:  Procedure Laterality Date  . ABDOMINAL HYSTERECTOMY  1975   TAH,BSO  . ABI  2013   R 0.91, L 0.83  . APPENDECTOMY     per pt report  . CARDIOVASCULAR STRESS TEST  2015   low risk myoview (Nahser)  . CATARACT SURGERY    . CHOLECYSTECTOMY    . COLONOSCOPY  02/2010   extensive diverticulosis throughout, small internal hemorrhoids, rec rpt 5 yrs (Dr. Allyn Kenner)  . CORONARY ARTERY BYPASS GRAFT  1998  . dexa scan  09/2011    femur -0.8, forearm 0.6 => normal  . ENDOVENOUS ABLATION SAPHENOUS VEIN W/ LASER Left 03/2014   Kellie Simmering  . ESI  2014   L S1 transforaminal ESI x2 (Chasnis) without improvement  . HAND SURGERY    . lexiscan myoview  03/2011   Small basal inferolateral and anterolateral reversible perfusion defect suggests ischemia.  EF was normal.   old infarct, no new ischemia  . LOWER EXTREMITY ANGIOGRAPHY Right 09/06/2018   Procedure: LOWER EXTREMITY ANGIOGRAPHY;  Surgeon: Algernon Huxley, MD;  Location: [facility];  Service: Cardiovascular;  Laterality: Right;  . toe surgery    . TYMPANOPLASTY  1960s   R side  . Vault prolapse A & P repair  2009   Dr Rhodia Albright [facility]   Family History  Problem Relation Age of Onset  . Hypertension Mother   . Stroke Mother   . Diabetes Father   . Diabetes Sister   . Diabetes Brother   . Heart disease Son   . Colon cancer Neg Hx    Social History   Socioeconomic History  . Marital status: Single    Spouse name: Not on file  . Number of children: Not on file  . Years of education: Not on file  . Highest education level: Not on file  Occupational History  . Occupation: Waitress   Social Needs  . Financial resource strain: Not hard at all  . Food insecurity    Worry: Never true    Inability: Never true  . Transportation needs    Medical: No    Non-medical: No  Tobacco Use  . Smoking status: Former Smoker    Quit date: 03/05/1979    Years since quitting: 39.9  . Smokeless tobacco: Never Used  Substance and Sexual Activity  . Alcohol use: No    Alcohol/week: 0.0 standard drinks  . Drug use: No  . Sexual activity: Not Currently    Comment: 1st intercourse 85yo.--- 2 sexual partners  Lifestyle  . Physical activity    Days per week: 0 days    Minutes per session: 0 min  . Stress: Very much  Relationships  . Social connections    Talks on phone: More than three times a week    Gets together: Not on file    Attends religious service:  Not on file    Active member of club or organization: Not on file    Attends meetings of clubs or organizations: Not on file    Relationship status: Not on file  Other Topics Concern  . 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   Independent for all ADLs, lives with a boyfriend.      Outpatient Encounter  Medications as of 02/05/2019  Medication Sig  . acetaminophen (TYLENOL) 500 MG tablet Take 500 mg by mouth every 6 (six) hours as needed for mild pain.  Marland Kitchen ALPRAZolam (XANAX) 1 MG tablet Take 0.5 mg by mouth 4 (four) times daily as needed for anxiety.   Marland Kitchen apixaban (ELIQUIS) 5 MG TABS tablet Take 1 tablet (5 mg total) by mouth 2 (two) times daily.  . collagenase (SANTYL) ointment Apply topically daily.  . diclofenac sodium (VOLTAREN) 1 % GEL Apply 1 application topically 3 (three) times daily.  . furosemide (LASIX) 20 MG tablet Take 0.5 tablets (10 mg total) by mouth daily.  Marland Kitchen glucosamine-chondroitin 500-400 MG tablet Take 1 tablet by mouth daily.   Marland Kitchen glucose blood (ONETOUCH VERIO) test strip USE TO TEST BLOOD SUGAR ONCE DAILY. DX:E11.9  . HYDROcodone-acetaminophen (NORCO/VICODIN) 5-325 MG tablet Take 0.5 tablets by mouth every 6 (six) hours as needed for moderate pain.  Marland Kitchen loperamide (IMODIUM) 2 MG capsule Take 2 mg by mouth 4 (four) times daily as needed for diarrhea or loose stools.  Marland Kitchen loratadine (CLARITIN) 10 MG tablet Take 10 mg by mouth daily as needed for allergies.  . metoprolol tartrate (LOPRESSOR) 25 MG tablet Take 1.5 tablets (37.5 mg total) by mouth 2 (two) times daily.  . Multiple Vitamin (MULTIVITAMIN) tablet Take 1 tablet by mouth daily. Patient reports takes a new vitamin to replace her centrum as recommended by her eye doctor.  . nitroGLYCERIN (NITROSTAT) 0.4 MG SL tablet Place 1 tablet (0.4 mg total) under the tongue every 5 (five) minutes as needed for chest pain.  Marland Kitchen nystatin cream (MYCOSTATIN) Apply 1 application topically 2 (two)  times daily.  Marland Kitchen nystatin-triamcinolone ointment (MYCOLOG) APPLY TO AFFECTED AREA TWICE A DAY AS NEEDED FOR ITCHING  . pantoprazole (PROTONIX) 40 MG tablet TAKE 1 TABLET BY MOUTH EVERY DAY  . pioglitazone (ACTOS) 30 MG tablet TAKE 1 TABLET BY MOUTH EVERY DAY  . polyethylene glycol powder (GLYCOLAX/MIRALAX) powder Take 17 g by mouth daily. Hold for diarrhea  . pravastatin (PRAVACHOL) 20 MG tablet TAKE 1 TABLET BY MOUTH EVERY DAY  . Probiotic Product (ALIGN) 4 MG CAPS Take 1 capsule (4 mg total) by mouth daily.  . ramipril (ALTACE) 5 MG capsule TAKE ONE CAPSULE BY MOUTH EVERYDAY FOR BLOOD PRESSURE  . traZODone (DESYREL) 150 MG tablet Take 150 mg by mouth at bedtime.   . triamcinolone cream (KENALOG) 0.1 % APPLY TO AFFECTED AREA TWICE A DAY  . vitamin B-12 (CYANOCOBALAMIN) 1000 MCG tablet Take 1,000 mcg by mouth daily.  . vitamin E 400 UNIT capsule Take 400 Units by mouth daily.   No facility-administered encounter medications on file as of 02/05/2019.     Activities of Daily Living In your present state of health, do you have any difficulty performing the following activities: 02/05/2019 03/23/2018  Hearing? Y N  Comment wears hearing aids -  Vision? N N  Difficulty concentrating or making decisions? N N  Walking or climbing stairs? N N  Dressing or bathing? N N  Doing errands, shopping? N N  Preparing Food and eating ? N -  Using the Toilet? N -  In the past six months, have you accidently leaked urine? Y -  Comment wears pads -  Do you have problems with loss of bowel control? N -  Managing your Medications? N -  Managing your Finances? N -  Housekeeping or managing your Housekeeping? N -  Some recent data might be hidden  Patient Care Team: Ria Bush, MD as PCP - General (Family Medicine) Nahser, Wonda Cheng, MD as PCP - Cardiology (Cardiology)    Assessment:   This is a routine wellness examination for [place].  Exercise Activities and Dietary recommendations Current  Exercise Habits: The patient does not participate in regular exercise at present, Exercise limited by: None identified  Goals    . Patient Stated     Starting 01/24/2018, I will continue to take medications as prescribed.     . Patient Stated     02/05/2019, I will maintain and continue medications as prescribed.        Fall Risk Fall Risk  02/05/2019 01/24/2018 12/08/2016 12/06/2016 11/16/2015  Falls in the past year? 1 Yes Yes No Yes  Number falls in past yr: 0 2 or more 1 - 2 or more  Injury with Fall? 0 Yes No - Yes  Risk Factor Category  - High Fall Risk - - High Fall Risk  Risk for fall due to : Medication side effect;History of fall(s) Impaired balance/gait;Impaired mobility;History of fall(s) - - History of fall(s);Impaired balance/gait;Impaired mobility  Follow up Falls evaluation completed;Falls prevention discussed - - - Falls evaluation completed;Falls prevention discussed;Education provided   Is the patient's home free of loose throw rugs in walkways, pet beds, electrical cords, etc?   yes      Grab bars in the bathroom? yes      Handrails on the stairs?   yes      Adequate lighting?   yes  Timed Get Up and Go performed: N/A  Depression Screen PHQ 2/9 Scores 02/05/2019 01/24/2018 12/08/2016 11/16/2015  PHQ - 2 Score 6 0 0 1  PHQ- 9 Score 12 0 - -     Cognitive Function MMSE - Mini Mental State Exam 02/05/2019 01/24/2018 11/16/2015  Orientation to time 5 5 5   Orientation to Place 5 5 5   Registration 3 3 3   Attention/ Calculation 0 0 0  Attention/Calculation-comments refused to do this - -  Recall 3 3 3   Language- name 2 objects - 0 0  Language- repeat 1 1 1   Language- follow 3 step command - 3 0  Language- follow 3 step command-comments - - pt was unable to follow 3 of 3 step command  Language- read & follow direction - 0 0  Write a sentence - 0 0  Copy design - 0 0  Total score - 20 17  Mini Cog  Mini-Cog screen was completed. Maximum score is 22. A value of 0  denotes this part of the MMSE was not completed or the patient failed this part of the Mini-Cog screening.      Immunization History  Administered Date(s) Administered  . Fluad Quad(high Dose 65+) 01/08/2019  . Influenza Split 12/20/2010  . Influenza, High Dose Seasonal PF 03/02/2015, 01/15/2018  . Influenza,inj,Quad PF,6+ Mos 01/01/2013, 12/12/2013, 12/15/2015, 01/09/2017  . Pneumococcal Conjugate-13 06/17/2013  . Pneumococcal Polysaccharide-23 11/16/2015  . Td 10/19/2017  . Zoster 09/06/2010    Qualifies for Shingles Vaccine? Yes  Screening Tests Health Maintenance  Topic Date Due  . DTaP/Tdap/Td (1 - Tdap) 10/20/2027 (Originally 06/06/1946)  . COLONOSCOPY  04/03/2049 (Originally 02/12/2015)  . FOOT EXAM  04/12/2019  . HEMOGLOBIN A1C  07/09/2019  . OPHTHALMOLOGY EXAM  09/03/2019  . TETANUS/TDAP  10/20/2027  . INFLUENZA VACCINE  Completed  . DEXA SCAN  Completed  . PNA vac Low Risk Adult  Completed    Cancer Screenings: Lung: Low Dose  CT Chest recommended if Age 3-80 years, 30 pack-year currently smoking OR have quit w/in 15years. Patient does not qualify. Breast:  Up to date on Mammogram? Yes, completed 04/24/2018   Up to date of Bone Density/Dexa? Yes, completed 01/10/2019 Colorectal: no longer required  Additional Screenings:  Hepatitis C Screening: N/A     Plan:    Patient will maintain and continue medications as prescribed.    I have personally reviewed and noted the following in the patient's chart:   . Medical and social history . Use of alcohol, tobacco or illicit drugs  . Current medications and supplements . Functional ability and status . Nutritional status . Physical activity . Advanced directives . List of other physicians . Hospitalizations, surgeries, and ER visits in previous 12 months . Vitals . Screenings to include cognitive, depression, and falls . Referrals and appointments  In addition, I have reviewed and discussed with patient certain  preventive protocols, quality metrics, and best practice recommendations. A written personalized care plan for preventive services as well as general preventive health recommendations were provided to patient.     Andrez Grime, LPN  [id number]

## 2019-02-05 NOTE — Patient Instructions (Signed)
Ms. [name] , Thank you for taking time to come for your Medicare Wellness Visit. I appreciate your ongoing commitment to your health goals. Please review the following plan we discussed and let me know if I can assist you in the future.   Screening recommendations/referrals: Colonoscopy: no longer required Mammogram: up to date, completed 04/24/2018 Bone Density: up to date, completed 01/10/2019 Recommended yearly ophthalmology/optometry visit for glaucoma screening and checkup Recommended yearly dental visit for hygiene and checkup  Vaccinations: Influenza vaccine: up to date, completed 01/08/2019 Pneumococcal vaccine: Completed series Tdap vaccine: up to date, completed 10/19/2017 Shingles vaccine: declined    Advanced directives: Please bring a copy of your POA (Power of [place]) and/or Living Will to your next appointment.   Conditions/risks identified: diabetes, hypertension  Next appointment: 02/11/2019 @ 2:30 pm    Preventive Care 65 Years and Older, Female Preventive care refers to lifestyle choices and visits with your health care provider that can promote health and wellness. What does preventive care include?  A yearly physical exam. This is also called an annual well check.  Dental exams once or twice a year.  Routine eye exams. Ask your health care provider how often you should have your eyes checked.  Personal lifestyle choices, including:  Daily care of your teeth and gums.  Regular physical activity.  Eating a healthy diet.  Avoiding tobacco and drug use.  Limiting alcohol use.  Practicing safe sex.  Taking low-dose aspirin every day.  Taking vitamin and mineral supplements as recommended by your health care provider. What happens during an annual well check? The services and screenings done by your health care provider during your annual well check will depend on your age, overall health, lifestyle risk factors, and family history of disease.  Counseling  Your health care provider may ask you questions about your:  Alcohol use.  Tobacco use.  Drug use.  Emotional well-being.  Home and relationship well-being.  Sexual activity.  Eating habits.  History of falls.  Memory and ability to understand (cognition).  Work and work Statistician.  Reproductive health. Screening  You may have the following tests or measurements:  Height, weight, and BMI.  Blood pressure.  Lipid and cholesterol levels. These may be checked every 5 years, or more frequently if you are over 46 years old.  Skin check.  Lung cancer screening. You may have this screening every year starting at age 24 if you have a 30-pack-year history of smoking and currently smoke or have quit within the past 15 years.  Fecal occult blood test (FOBT) of the stool. You may have this test every year starting at age 83.  Flexible sigmoidoscopy or colonoscopy. You may have a sigmoidoscopy every 5 years or a colonoscopy every 10 years starting at age 62.  Hepatitis C blood test.  Hepatitis B blood test.  Sexually transmitted disease (STD) testing.  Diabetes screening. This is done by checking your blood sugar (glucose) after you have not eaten for a while (fasting). You may have this done every 1-3 years.  Bone density scan. This is done to screen for osteoporosis. You may have this done starting at age 19.  Mammogram. This may be done every 1-2 years. Talk to your health care provider about how often you should have regular mammograms. Talk with your health care provider about your test results, treatment options, and if necessary, the need for more tests. Vaccines  Your health care provider may recommend certain vaccines, such as:  Influenza  vaccine. This is recommended every year.  Tetanus, diphtheria, and acellular pertussis (Tdap, Td) vaccine. You may need a Td booster every 10 years.  Zoster vaccine. You may need this after age 24.   Pneumococcal 13-valent conjugate (PCV13) vaccine. One dose is recommended after age 22.  Pneumococcal polysaccharide (PPSV23) vaccine. One dose is recommended after age 21. Talk to your health care provider about which screenings and vaccines you need and how often you need them. This information is not intended to replace advice given to you by your health care provider. Make sure you discuss any questions you have with your health care provider. Document Released: 04/17/2015 Document Revised: 12/09/2015 Document Reviewed: 01/20/2015 Elsevier Interactive Patient Education  2017 [place] Prevention in the Home Falls can cause injuries. They can happen to people of all ages. There are many things you can do to make your home safe and to help prevent falls. What can I do on the outside of my home?  Regularly fix the edges of walkways and driveways and fix any cracks.  Remove anything that might make you trip as you walk through a door, such as a raised step or threshold.  Trim any bushes or trees on the path to your home.  Use bright outdoor lighting.  Clear any walking paths of anything that might make someone trip, such as rocks or tools.  Regularly check to see if handrails are loose or broken. Make sure that both sides of any steps have handrails.  Any raised decks and porches should have guardrails on the edges.  Have any leaves, snow, or ice cleared regularly.  Use sand or salt on walking paths during winter.  Clean up any spills in your garage right away. This includes oil or grease spills. What can I do in the bathroom?  Use night lights.  Install grab bars by the toilet and in the tub and shower. Do not use towel bars as grab bars.  Use non-skid mats or decals in the tub or shower.  If you need to sit down in the shower, use a plastic, non-slip stool.  Keep the floor dry. Clean up any water that spills on the floor as soon as it happens.  Remove soap  buildup in the tub or shower regularly.  Attach bath mats securely with double-sided non-slip rug tape.  Do not have throw rugs and other things on the floor that can make you trip. What can I do in the bedroom?  Use night lights.  Make sure that you have a light by your bed that is easy to reach.  Do not use any sheets or blankets that are too big for your bed. They should not hang down onto the floor.  Have a firm chair that has side arms. You can use this for support while you get dressed.  Do not have throw rugs and other things on the floor that can make you trip. What can I do in the kitchen?  Clean up any spills right away.  Avoid walking on wet floors.  Keep items that you use a lot in easy-to-reach places.  If you need to reach something above you, use a strong step stool that has a grab bar.  Keep electrical cords out of the way.  Do not use floor polish or wax that makes floors slippery. If you must use wax, use non-skid floor wax.  Do not have throw rugs and other things on the floor that can  make you trip. What can I do with my stairs?  Do not leave any items on the stairs.  Make sure that there are handrails on both sides of the stairs and use them. Fix handrails that are broken or loose. Make sure that handrails are as long as the stairways.  Check any carpeting to make sure that it is firmly attached to the stairs. Fix any carpet that is loose or worn.  Avoid having throw rugs at the top or bottom of the stairs. If you do have throw rugs, attach them to the floor with carpet tape.  Make sure that you have a light switch at the top of the stairs and the bottom of the stairs. If you do not have them, ask someone to add them for you. What else can I do to help prevent falls?  Wear shoes that:  Do not have high heels.  Have rubber bottoms.  Are comfortable and fit you well.  Are closed at the toe. Do not wear sandals.  If you use a stepladder:  Make  sure that it is fully opened. Do not climb a closed stepladder.  Make sure that both sides of the stepladder are locked into place.  Ask someone to hold it for you, if possible.  Clearly mark and make sure that you can see:  Any grab bars or handrails.  First and last steps.  Where the edge of each step is.  Use tools that help you move around (mobility aids) if they are needed. These include:  Canes.  Walkers.  Scooters.  Crutches.  Turn on the lights when you go into a dark area. Replace any light bulbs as soon as they burn out.  Set up your furniture so you have a clear path. Avoid moving your furniture around.  If any of your floors are uneven, fix them.  If there are any pets around you, be aware of where they are.  Review your medicines with your doctor. Some medicines can make you feel dizzy. This can increase your chance of falling. Ask your doctor what other things that you can do to help prevent falls. This information is not intended to replace advice given to you by your health care provider. Make sure you discuss any questions you have with your health care provider. Document Released: 01/15/2009 Document Revised: 08/27/2015 Document Reviewed: 04/25/2014 Elsevier Interactive Patient Education  2017 [employer].

## 2019-02-05 NOTE — Progress Notes (Signed)
PCP notes:  Health Maintenance: Declined Shingrix   Abnormal Screenings: none   Patient concerns: none   Nurse concerns: none   Next PCP appt.: 02/11/2019 @ 2:30 pm

## 2019-02-07 ENCOUNTER — Other Ambulatory Visit: Payer: Self-pay

## 2019-02-07 ENCOUNTER — Telehealth: Payer: Self-pay

## 2019-02-07 ENCOUNTER — Encounter: Payer: Self-pay | Admitting: Intensive Care

## 2019-02-07 ENCOUNTER — Inpatient Hospital Stay
Admission: EM | Admit: 2019-02-07 | Discharge: 2019-02-20 | DRG: 391 | Disposition: A | Discharge status: Skilled Nursing Facility | Payer: Medicare Other | Attending: Internal Medicine | Admitting: Internal Medicine

## 2019-02-07 ENCOUNTER — Emergency Department: Payer: Medicare Other

## 2019-02-07 ENCOUNTER — Emergency Department
Admission: EM | Admit: 2019-02-07 | Discharge: 2019-02-07 | Disposition: A | Discharge status: Home / Self Care | Payer: Medicare Other | Source: Home / Self Care

## 2019-02-07 ENCOUNTER — Encounter: Payer: Self-pay | Admitting: Emergency Medicine

## 2019-02-07 DIAGNOSIS — J9601 Acute respiratory failure with hypoxia: Secondary | ICD-10-CM | POA: Diagnosis not present

## 2019-02-07 DIAGNOSIS — Z823 Family history of stroke: Secondary | ICD-10-CM

## 2019-02-07 DIAGNOSIS — I110 Hypertensive heart disease with heart failure: Secondary | ICD-10-CM | POA: Diagnosis present

## 2019-02-07 DIAGNOSIS — J9622 Acute and chronic respiratory failure with hypercapnia: Secondary | ICD-10-CM | POA: Diagnosis present

## 2019-02-07 DIAGNOSIS — I4819 Other persistent atrial fibrillation: Secondary | ICD-10-CM | POA: Diagnosis present

## 2019-02-07 DIAGNOSIS — Z7901 Long term (current) use of anticoagulants: Secondary | ICD-10-CM

## 2019-02-07 DIAGNOSIS — Z888 Allergy status to other drugs, medicaments and biological substances status: Secondary | ICD-10-CM

## 2019-02-07 DIAGNOSIS — K265 Chronic or unspecified duodenal ulcer with perforation: Secondary | ICD-10-CM | POA: Diagnosis not present

## 2019-02-07 DIAGNOSIS — J9621 Acute and chronic respiratory failure with hypoxia: Secondary | ICD-10-CM | POA: Diagnosis present

## 2019-02-07 DIAGNOSIS — J189 Pneumonia, unspecified organism: Secondary | ICD-10-CM | POA: Diagnosis not present

## 2019-02-07 DIAGNOSIS — K5712 Diverticulitis of small intestine without perforation or abscess without bleeding: Secondary | ICD-10-CM | POA: Diagnosis not present

## 2019-02-07 DIAGNOSIS — E785 Hyperlipidemia, unspecified: Secondary | ICD-10-CM | POA: Diagnosis present

## 2019-02-07 DIAGNOSIS — Z20828 Contact with and (suspected) exposure to other viral communicable diseases: Secondary | ICD-10-CM | POA: Diagnosis not present

## 2019-02-07 DIAGNOSIS — I10 Essential (primary) hypertension: Secondary | ICD-10-CM | POA: Diagnosis present

## 2019-02-07 DIAGNOSIS — Z86718 Personal history of other venous thrombosis and embolism: Secondary | ICD-10-CM

## 2019-02-07 DIAGNOSIS — N179 Acute kidney failure, unspecified: Secondary | ICD-10-CM

## 2019-02-07 DIAGNOSIS — Z79899 Other long term (current) drug therapy: Secondary | ICD-10-CM

## 2019-02-07 DIAGNOSIS — K219 Gastro-esophageal reflux disease without esophagitis: Secondary | ICD-10-CM | POA: Diagnosis present

## 2019-02-07 DIAGNOSIS — I5033 Acute on chronic diastolic (congestive) heart failure: Secondary | ICD-10-CM | POA: Diagnosis not present

## 2019-02-07 DIAGNOSIS — I872 Venous insufficiency (chronic) (peripheral): Secondary | ICD-10-CM | POA: Diagnosis present

## 2019-02-07 DIAGNOSIS — B9620 Unspecified Escherichia coli [E. coli] as the cause of diseases classified elsewhere: Secondary | ICD-10-CM | POA: Diagnosis present

## 2019-02-07 DIAGNOSIS — F419 Anxiety disorder, unspecified: Secondary | ICD-10-CM | POA: Diagnosis present

## 2019-02-07 DIAGNOSIS — Z833 Family history of diabetes mellitus: Secondary | ICD-10-CM

## 2019-02-07 DIAGNOSIS — E1159 Type 2 diabetes mellitus with other circulatory complications: Secondary | ICD-10-CM | POA: Diagnosis present

## 2019-02-07 DIAGNOSIS — Z8249 Family history of ischemic heart disease and other diseases of the circulatory system: Secondary | ICD-10-CM

## 2019-02-07 DIAGNOSIS — I480 Paroxysmal atrial fibrillation: Secondary | ICD-10-CM | POA: Diagnosis present

## 2019-02-07 DIAGNOSIS — I5032 Chronic diastolic (congestive) heart failure: Secondary | ICD-10-CM | POA: Diagnosis present

## 2019-02-07 DIAGNOSIS — I2510 Atherosclerotic heart disease of native coronary artery without angina pectoris: Secondary | ICD-10-CM | POA: Diagnosis present

## 2019-02-07 DIAGNOSIS — Z8711 Personal history of peptic ulcer disease: Secondary | ICD-10-CM

## 2019-02-07 DIAGNOSIS — R7881 Bacteremia: Secondary | ICD-10-CM | POA: Diagnosis present

## 2019-02-07 DIAGNOSIS — N390 Urinary tract infection, site not specified: Secondary | ICD-10-CM | POA: Diagnosis not present

## 2019-02-07 DIAGNOSIS — Z951 Presence of aortocoronary bypass graft: Secondary | ICD-10-CM

## 2019-02-07 DIAGNOSIS — Z5321 Procedure and treatment not carried out due to patient leaving prior to being seen by health care provider: Secondary | ICD-10-CM | POA: Insufficient documentation

## 2019-02-07 DIAGNOSIS — E1169 Type 2 diabetes mellitus with other specified complication: Secondary | ICD-10-CM | POA: Diagnosis present

## 2019-02-07 DIAGNOSIS — Z87891 Personal history of nicotine dependence: Secondary | ICD-10-CM

## 2019-02-07 DIAGNOSIS — I252 Old myocardial infarction: Secondary | ICD-10-CM

## 2019-02-07 DIAGNOSIS — E1149 Type 2 diabetes mellitus with other diabetic neurological complication: Secondary | ICD-10-CM | POA: Diagnosis present

## 2019-02-07 DIAGNOSIS — Z7984 Long term (current) use of oral hypoglycemic drugs: Secondary | ICD-10-CM

## 2019-02-07 DIAGNOSIS — R1031 Right lower quadrant pain: Secondary | ICD-10-CM | POA: Insufficient documentation

## 2019-02-07 DIAGNOSIS — R000 Tachycardia, unspecified: Secondary | ICD-10-CM | POA: Diagnosis not present

## 2019-02-07 DIAGNOSIS — E1151 Type 2 diabetes mellitus with diabetic peripheral angiopathy without gangrene: Secondary | ICD-10-CM | POA: Diagnosis present

## 2019-02-07 DIAGNOSIS — Z03818 Encounter for observation for suspected exposure to other biological agents ruled out: Secondary | ICD-10-CM | POA: Diagnosis not present

## 2019-02-07 DIAGNOSIS — K261 Acute duodenal ulcer with perforation: Secondary | ICD-10-CM | POA: Diagnosis not present

## 2019-02-07 DIAGNOSIS — R0600 Dyspnea, unspecified: Secondary | ICD-10-CM

## 2019-02-07 DIAGNOSIS — F0390 Unspecified dementia without behavioral disturbance: Secondary | ICD-10-CM | POA: Diagnosis present

## 2019-02-07 DIAGNOSIS — M810 Age-related osteoporosis without current pathological fracture: Secondary | ICD-10-CM | POA: Diagnosis present

## 2019-02-07 LAB — COMPREHENSIVE METABOLIC PANEL
ALT: 18 U/L (ref 0–44)
AST: 26 U/L (ref 15–41)
Albumin: 3.6 g/dL (ref 3.5–5.0)
Alkaline Phosphatase: 94 U/L (ref 38–126)
Anion gap: 11 (ref 5–15)
BUN: 31 mg/dL — ABNORMAL HIGH (ref 8–23)
CO2: 26 mmol/L (ref 22–32)
Calcium: 9.2 mg/dL (ref 8.9–10.3)
Chloride: 100 mmol/L (ref 98–111)
Creatinine, Ser: 1.6 mg/dL — ABNORMAL HIGH (ref 0.44–1.00)
GFR calc Af Amer: 33 mL/min — ABNORMAL LOW (ref >60)
GFR calc non Af Amer: 28 mL/min — ABNORMAL LOW (ref >60)
Glucose, Bld: 100 mg/dL — ABNORMAL HIGH (ref 70–99)
Potassium: 4.2 mmol/L (ref 3.5–5.1)
Sodium: 137 mmol/L (ref 135–145)
Total Bilirubin: 0.8 mg/dL (ref 0.3–1.2)
Total Protein: 6.7 g/dL (ref 6.5–8.1)

## 2019-02-07 LAB — CBC
HCT: 39.7 % (ref 36.0–46.0)
Hemoglobin: 12.4 g/dL (ref 12.0–15.0)
MCH: 30 pg (ref 26.0–34.0)
MCHC: 31.2 g/dL (ref 30.0–36.0)
MCV: 95.9 fL (ref 80.0–100.0)
Platelets: 186 10*3/uL (ref 150–400)
RBC: 4.14 MIL/uL (ref 3.87–5.11)
RDW: 14.5 % (ref 11.5–15.5)
WBC: 25.7 10*3/uL — ABNORMAL HIGH (ref 4.0–10.5)
nRBC: 0 % (ref 0.0–0.2)

## 2019-02-07 LAB — URINALYSIS, COMPLETE (UACMP) WITH MICROSCOPIC
Bilirubin Urine: NEGATIVE
Glucose, UA: NEGATIVE mg/dL
Ketones, ur: NEGATIVE mg/dL
Nitrite: NEGATIVE
Protein, ur: NEGATIVE mg/dL
Specific Gravity, Urine: 1.014 (ref 1.005–1.030)
WBC, UA: >50 WBC/hpf — ABNORMAL HIGH (ref 0–5)
pH: 5 (ref 5.0–8.0)

## 2019-02-07 LAB — LIPASE, BLOOD: Lipase: 23 U/L (ref 11–51)

## 2019-02-07 MED ORDER — IOHEXOL 9 MG/ML PO SOLN
500.0000 mL | ORAL | Status: AC
  Administered 2019-02-07: 500 mL via ORAL
  Filled 2019-02-07 (×2): qty 500

## 2019-02-07 MED ORDER — PIPERACILLIN-TAZOBACTAM 3.375 G IVPB 30 MIN: 3.3750 g | Freq: Once | INTRAVENOUS | Status: DC

## 2019-02-07 MED ORDER — PIPERACILLIN-TAZOBACTAM 3.375 G IVPB
3.3750 g | Freq: Once | INTRAVENOUS | Status: AC
  Administered 2019-02-08: 3.375 g via INTRAVENOUS
  Filled 2019-02-07: qty 50

## 2019-02-07 NOTE — ED Notes (Signed)
Patient transported to CT 

## 2019-02-07 NOTE — Telephone Encounter (Signed)
Agree with this. Thank you.  plz call tomorrow for an update.

## 2019-02-07 NOTE — Telephone Encounter (Signed)
Noted  

## 2019-02-07 NOTE — ED Triage Notes (Signed)
Patient c/o fever, chills, and abdominal/right sided flank pain X2 days. Patient took tylenol around 0700 today. A&O x4.

## 2019-02-07 NOTE — ED Notes (Signed)
Noted WBC 25.7; called pt's granddaughter and informed to please have pt come back immediately for further treatment

## 2019-02-07 NOTE — ED Provider Notes (Signed)
[facility] Emergency Department Provider Note   First MD Initiated Contact with Patient 02/07/19 2045     (approximate)  I have reviewed the triage vital signs and the nursing notes.   HISTORY  Chief Complaint Abnormal Lab   HPI [name] is a 85 y.o. female below list of previous medical conditions presents to the emergency department secondary to generalized abdominal pain which patient states started yesterday.  Patient denies any nausea vomiting diarrhea or constipation.  Patient does admit to subjective fever.  Patient also admits to urinary frequency and urgency.        Past Medical History:  Diagnosis Date   Acute deep vein thrombosis (DVT) of distal vein of right lower extremity ([place]) 12/2013   Acute thrombus of R proximal gastrocnemius vein. Symptomatic provoked distal DVT   Anxiety    Cellulitis of leg, left 01/16/2014   Chronic back pain 03/2012   lumbar DDD with herniation - s/p L S1 transforaminal ESI (10/2012 Dr. Sharlet Salina)   Chronic venous insufficiency    Coronary artery disease    CABG 1998   Cystocele    Depression    Diabetes mellitus    type 2, followed by endo.   Diastolic dysfunction    Diverticulosis    Hemorrhoids    History of heart attack    Hx of echocardiogram    a. Echo 10/13: mild LVH, EF 55-60%, Gr 1 diast dysfn, PASP 32   Hyperlipidemia    Hypertension    Irritable bowel syndrome    Microcytic anemia    PAD (peripheral artery disease) ([place]) 2013   ABI: R 0.91, L 0.83   Postmenopausal osteoporosis 01/2019   T score of -2.6   PUD (peptic ulcer disease)    Varicose veins    Venous ulcer of leg ([place]) 12/12/2013   Required prolonged treatment by wound center   Viral gastroenteritis due to [place] virus 05/13/2015   hospitalization    Patient Active Problem List   Diagnosis Date Noted   Chronic leg pain 11/14/2018   Atherosclerosis of native arteries of the extremities with  ulceration ([place]) 08/21/2018   Lumbar strain, initial encounter 07/18/2018   Chronic constipation 06/18/2018   Erythema of breast 04/18/2018   MRSA cellulitis 03/31/2018   Advanced directives, counseling/discussion 01/29/2018   Bilateral hearing loss 01/29/2018   Spinal stenosis of lumbosacral region 01/08/2018   Acute right-sided low back pain 12/26/2017   Leg wound, right, subsequent encounter 38/75/6433   Systolic murmur 29/51/8841   De Quervain's tenosynovitis, left 01/09/2017   Encounter for chronic pain management 02/09/2016   Paroxysmal atrial fibrillation (HCC)    Varicose veins of left lower extremity with both ulcer of calf and inflammation ([place]) 05/22/2015   Varicose veins of right lower extremity with both ulcer of calf and inflammation ([place]) 01/23/2015   Diabetes mellitus type 2 with peripheral artery disease (HCC)    Chronic venous insufficiency    Bleeding from varicose veins of lower extremity, left 02/13/2014   Diabetic polyneuropathy ([place]) 11/19/2013   Type 2 diabetes mellitus with neurological manifestations, controlled ([place]) 11/19/2013   Right shoulder pain 08/19/2013   Cerumen impaction 04/16/2013   Abdominal pain 05/14/2012   Peptic ulcer disease    Unsteady gait 12/13/2011   Chronic lower back pain 09/14/2011   Microcytic anemia 09/06/2010   Hyperlipidemia 09/06/2010   GAD (generalized anxiety disorder) 03/15/2010   Obesity, Class I, BMI 30-34.9 03/15/2010   Hypertension 03/15/2010   (  HFpEF) heart failure with preserved ejection fraction ([place]) 03/15/2010   Coronary artery disease     Past Surgical History:  Procedure Laterality Date   ABDOMINAL HYSTERECTOMY  1975   TAH,BSO   ABI  2013   R 0.91, L 0.83   APPENDECTOMY     per pt report   CARDIOVASCULAR STRESS TEST  2015   low risk myoview (Nahser)   CATARACT SURGERY     CHOLECYSTECTOMY     COLONOSCOPY  02/2010   extensive diverticulosis throughout, small  internal hemorrhoids, rec rpt 5 yrs (Dr. Allyn Kenner)   [place]   dexa scan  09/2011   femur -0.8, forearm 0.6 => normal   ENDOVENOUS ABLATION SAPHENOUS VEIN W/ LASER Left 03/2014   [facility]   ESI  2014   L S1 transforaminal ESI x2 ([place]) without improvement   HAND SURGERY     lexiscan myoview  03/2011   Small basal inferolateral and anterolateral reversible perfusion defect suggests ischemia.  EF was normal.   old infarct, no new ischemia   LOWER EXTREMITY ANGIOGRAPHY Right 09/06/2018   Procedure: LOWER EXTREMITY ANGIOGRAPHY;  Surgeon: Algernon Huxley, MD;  Location: [place] [facility];  Service: Cardiovascular;  Laterality: Right;   toe surgery     TYMPANOPLASTY  1960s   R side   Vault prolapse A & P repair  2009   [facility]    Prior to Admission medications   Medication Sig Start Date End Date Taking? Authorizing Provider  acetaminophen (TYLENOL) 500 MG tablet Take 500 mg by mouth every 6 (six) hours as needed for mild pain.    [provider]  ALPRAZolam Duanne Moron) 1 MG tablet Take 0.5 mg by mouth 4 (four) times daily as needed for anxiety.     [provider]  apixaban (ELIQUIS) 5 MG TABS tablet Take 1 tablet (5 mg total) by mouth 2 (two) times daily. 12/13/18   Nahser, Wonda Cheng, MD  collagenase (SANTYL) ointment Apply topically daily. 03/27/18   Bettey Costa, MD  diclofenac sodium (VOLTAREN) 1 % GEL Apply 1 application topically 3 (three) times daily. 01/09/17   Ria Bush, MD  furosemide (LASIX) 20 MG tablet Take 0.5 tablets (10 mg total) by mouth daily. 12/05/18   Ria Bush, MD  glucosamine-chondroitin 500-400 MG tablet Take 1 tablet by mouth daily.     [provider]  glucose blood (ONETOUCH VERIO) test strip USE TO TEST BLOOD SUGAR ONCE DAILY. DX:E11.9 01/21/19   Elayne Snare, MD  HYDROcodone-acetaminophen (NORCO/VICODIN) 5-325 MG tablet Take 0.5 tablets by mouth every 6 (six) hours as needed for  moderate pain. 01/14/19   Ria Bush, MD  loperamide (IMODIUM) 2 MG capsule Take 2 mg by mouth 4 (four) times daily as needed for diarrhea or loose stools.    [provider]  loratadine (CLARITIN) 10 MG tablet Take 10 mg by mouth daily as needed for allergies.    [provider]  metoprolol tartrate (LOPRESSOR) 25 MG tablet Take 1.5 tablets (37.5 mg total) by mouth 2 (two) times daily. 01/22/19 04/22/19  [name] T, PA-C  Multiple Vitamin (MULTIVITAMIN) tablet Take 1 tablet by mouth daily. Patient reports takes a new vitamin to replace her centrum as recommended by her eye doctor.    [provider]  nitroGLYCERIN (NITROSTAT) 0.4 MG SL tablet Place 1 tablet (0.4 mg total) under the tongue every 5 (five) minutes as needed for chest pain. 02/02/17  Nahser, Wonda Cheng, MD  nystatin cream (MYCOSTATIN) Apply 1 application topically 2 (two) times daily. 09/04/18   Ria Bush, MD  nystatin-triamcinolone ointment (MYCOLOG) APPLY TO AFFECTED AREA TWICE A DAY AS NEEDED FOR ITCHING 01/03/19   Fontaine, Belinda Block, MD  pantoprazole (PROTONIX) 40 MG tablet TAKE 1 TABLET BY MOUTH EVERY DAY 10/22/18   Ria Bush, MD  pioglitazone (ACTOS) 30 MG tablet TAKE 1 TABLET BY MOUTH EVERY DAY 01/28/19   Elayne Snare, MD  polyethylene glycol powder (GLYCOLAX/MIRALAX) powder Take 17 g by mouth daily. Hold for diarrhea 06/18/18   Ria Bush, MD  pravastatin (PRAVACHOL) 20 MG tablet TAKE 1 TABLET BY MOUTH EVERY DAY 02/04/19   Ria Bush, MD  Probiotic Product (ALIGN) 4 MG CAPS Take 1 capsule (4 mg total) by mouth daily. 08/15/17   Ria Bush, MD  ramipril (ALTACE) 5 MG capsule TAKE ONE CAPSULE BY MOUTH EVERYDAY FOR BLOOD PRESSURE 09/01/17   Ria Bush, MD  traZODone (DESYREL) 150 MG tablet Take 150 mg by mouth at bedtime.     [provider]  triamcinolone cream (KENALOG) 0.1 % APPLY TO AFFECTED AREA TWICE A DAY 09/10/18   Ria Bush, MD  vitamin  B-12 (CYANOCOBALAMIN) 1000 MCG tablet Take 1,000 mcg by mouth daily.    [provider]  vitamin E 400 UNIT capsule Take 400 Units by mouth daily.    [provider]    Allergies Metformin and related  Family History  Problem Relation Age of Onset   Hypertension Mother    Stroke Mother    Diabetes Father    Diabetes Sister    Diabetes Brother    Heart disease Son    Colon cancer Neg Hx     Social History Social History   Tobacco Use   Smoking status: Former Smoker    Quit date: 03/05/1979    Years since quitting: 39.9   Smokeless tobacco: Never Used  Substance Use Topics   Alcohol use: No    Alcohol/week: 0.0 standard drinks   Drug use: No    Review of Systems Constitutional: Positive for subjective fever Eyes: No visual changes. ENT: No sore throat. Cardiovascular: Denies chest pain. Respiratory: Denies shortness of breath. Gastrointestinal: Positive for abdominal pain no nausea, no vomiting.  No diarrhea.  No constipation. Genitourinary: Negative for dysuria. Musculoskeletal: Negative for neck pain.  Negative for back pain. Integumentary: Negative for rash. Neurological: Negative for headaches, focal weakness or numbness.   ____________________________________________   PHYSICAL EXAM:  VITAL SIGNS: ED Triage Vitals [02/07/19 2041]  Enc Vitals Group     BP      Pulse      Resp      Temp      Temp src      SpO2      Weight 65.3 kg (143 lb 15.4 oz)     Height 1.448 m (4\' 9" )     Head Circumference      Peak Flow      Pain Score      Pain Loc      Pain Edu?      Excl. in [place]?     Constitutional: Alert and oriented.  Eyes: Conjunctivae are normal.  Mouth/Throat: Patient is wearing a mask. Neck: No stridor.  No meningeal signs.   Cardiovascular: Normal rate, regular rhythm. Good peripheral circulation. Grossly normal heart sounds. Respiratory: Normal respiratory effort.  No retractions. Gastrointestinal: Right upper  quadrant/right lower quadrant tenderness. No distention.  Musculoskeletal:  No lower extremity tenderness nor edema. No gross deformities of extremities. Neurologic:  Normal speech and language. No gross focal neurologic deficits are appreciated.  Skin:  Skin is warm, dry and intact. Psychiatric: Mood and affect are normal. Speech and behavior are normal.  ____________________________________________   LABS (all labs ordered are listed, but only abnormal results are displayed)  Labs Reviewed  CULTURE, BLOOD (ROUTINE X 2)  CULTURE, BLOOD (ROUTINE X 2)  SARS CORONAVIRUS 2 (TAT 6-24 HRS)   _______________________________ RADIOLOGY I, [place] Johnette Teigen, personally viewed and evaluated these images (plain radiographs) as part of my medical decision making, as well as reviewing the written report by the radiologist.  ED MD interpretation: Fluid-filled outpouching arising from the first portion of the duodenum with irregular mural and adjacent inflammation thickening concerning for contained perforation of a duodenal ulcer or an inflamed duodenal diverticulum.  Pancolonic diverticulosis  Official radiology report(s): Ct Abdomen Pelvis Wo Contrast  Result Date: 02/07/2019 CLINICAL DATA:  Acute generalized abdominal pain, fevers chills and right-sided abdominal/flank pain EXAM: CT ABDOMEN AND PELVIS WITHOUT CONTRAST TECHNIQUE: Multidetector CT imaging of the abdomen and pelvis was performed following the standard protocol without IV contrast. COMPARISON:  CT abdomen pelvis 07/30/2012 FINDINGS: Lower chest: Streaky atelectatic changes are present in the lung bases. Moderate cardiomegaly with four-chamber enlargement. Mitral annular and aortic leaflet calcifications are present. Coronary artery calcifications are present. No pericardial effusion. Hepatobiliary: No focal liver abnormality is seen. Patient is post cholecystectomy. Slight prominence of the biliary tree likely related to reservoir effect. No  calcified intraductal gallstones. Pancreas: Unremarkable. No pancreatic ductal dilatation or surrounding inflammatory changes. Spleen: Normal in size without focal abnormality. Adrenals/Urinary Tract: Stable nodular thickening of the adrenal glands without discrete concerning adrenal nodule. Kidneys are unremarkable, without renal calculi, suspicious lesion, or hydronephrosis. Bladder is slightly displaced rightward though this configuration is similar to comparison CT from 2014. No other bladder abnormality. Stomach/Bowel: Distal esophagus and stomach are unremarkable. There is a fluid-filled outpouching arising from the first portion of the duodenum with some irregular mural thickening and adjacent inflammation (2/34, 5/43). No small bowel dilatation or wall thickening. The appendix appears surgically absent. There is extensive pancolonic diverticulosis without evidence of acute peridiverticular inflammation. No colonic dilatation or wall thickening. Vascular/Lymphatic: Extensive atherosclerotic plaque of the aorta and branch vessels. Tortuosity of the abdominal aorta, a likely senescent change. No suspicious or enlarged lymph nodes in the included lymphatic chains. Reactive appearing upper abdominal lymph nodes are present. Reproductive: Uterus is surgically absent. No concerning adnexal lesions. Other: No abdominopelvic free fluid or free gas. No bowel containing hernias. Small fat containing umbilical hernia mild body wall edema. Significant pelvic floor laxity with likely cyst seal. Musculoskeletal: No acute osseous abnormality or suspicious osseous lesion. Multilevel degenerative changes are present in the imaged portions of the spine. There is dextrocurvature of the thoracolumbar spine centered at the thoracolumbar junction. Sclerotic changes of the SI joints predominantly ileal side may reflect sequela of prior osteitis condensans. IMPRESSION: 1. Fluid-filled outpouching arising from the first portion of  the duodenum with some irregular mural thickening and adjacent inflammation, concerning for contained perforation of a duodenal ulcer or inflamed duodenal diverticulum. 2. Extensive noninflamed pancolonic diverticulosis. 3. Pelvic floor laxity with likely cystocele. 4. Aortic Atherosclerosis (ICD10-I70.0). 5. Cardiomegaly and four-chamber cardiac enlargement. Coronary atherosclerosis. Electronically Signed   By: Lovena Le M.D.   On: 02/07/2019 23:23    Procedures   ____________________________________________   INITIAL IMPRESSION / MDM / ASSESSMENT AND  PLAN / ED COURSE  As part of my medical decision making, I reviewed the following data within the electronic MEDICAL RECORD NUMBER    85 year old female presenting with above-stated history and physical exam secondary to abdominal pain.  Patient's laboratory data notable for leukocytosis of 25.  Concern for possible diverticulitis colitis versus other internal abdominal pathology.  CT scan revealed evidence of a possible perforation or inflamed duodenal diverticulum.  Patient given Zosyn 3.375 mg.  Patient discussed with Dr. Hanley Hays from general surgery who came and evaluated the patient with recommendation for hospital admission for IV antibiotics.  Patient subsequently discussed with Dr. Sidney Ace hospitalist who will admit the patient for further evaluation and management.          ____________________________________________  FINAL CLINICAL IMPRESSION(S) / ED DIAGNOSES  Final diagnoses:  Duodenal ulcer perforation ([place])     MEDICATIONS GIVEN DURING THIS VISIT:  Medications  iohexol (OMNIPAQUE) 9 MG/ML oral solution 500 mL ( Oral Canceled Entry 02/07/19 2258)  piperacillin-tazobactam (ZOSYN) IVPB 3.375 g (has no administration in time range)     ED Discharge Orders    None      *Please note:  Shabria Egley Fite was evaluated in Emergency Department on 02/08/2019 for the symptoms described in the history of present illness. She was  evaluated in the context of the global COVID-19 pandemic, which necessitated consideration that the patient might be at risk for infection with the SARS-CoV-2 virus that causes COVID-19. Institutional protocols and algorithms that pertain to the evaluation of patients at risk for COVID-19 are in a state of rapid change based on information released by regulatory bodies including the CDC and federal and state organizations. These policies and algorithms were followed during the patient's care in the ED.  Some ED evaluations and interventions may be delayed as a result of limited staffing during the pandemic.*  Note:  This document was prepared using Dragon voice recognition software and may include unintentional dictation errors.   Gregor Hams, MD 02/08/19 [phone]

## 2019-02-07 NOTE — ED Triage Notes (Signed)
Patient ambulatory to triage with steady gait, without difficulty or distress noted, mask in place; pt here earlier but left due to long wait and told to return due to abnormal labwork

## 2019-02-07 NOTE — ED Notes (Addendum)
Pt st leaving now due to long wait per STAT registration

## 2019-02-07 NOTE — Telephone Encounter (Signed)
Starting on 02/06/19 pt having extreme abd pain; last night had fever 103 but pt said now no fever. Pt said pain starts in back and goes to the front of lower abd. Pt said pain level is high. Diarrhea started on 02/06/19. Pt does not want to go to ED and Dr Darnell Level said pt needs face to face evaluation and if pt will not go to ED she should go to UC. Pt voiced understanding and will call her boyfriend to take pt to UC now.911 declined.FYI to Dr Darnell Level.

## 2019-02-08 DIAGNOSIS — E669 Obesity, unspecified: Secondary | ICD-10-CM | POA: Diagnosis not present

## 2019-02-08 DIAGNOSIS — E1149 Type 2 diabetes mellitus with other diabetic neurological complication: Secondary | ICD-10-CM | POA: Diagnosis present

## 2019-02-08 DIAGNOSIS — R933 Abnormal findings on diagnostic imaging of other parts of digestive tract: Secondary | ICD-10-CM | POA: Diagnosis not present

## 2019-02-08 DIAGNOSIS — I480 Paroxysmal atrial fibrillation: Secondary | ICD-10-CM | POA: Diagnosis not present

## 2019-02-08 DIAGNOSIS — I872 Venous insufficiency (chronic) (peripheral): Secondary | ICD-10-CM | POA: Diagnosis present

## 2019-02-08 DIAGNOSIS — F419 Anxiety disorder, unspecified: Secondary | ICD-10-CM | POA: Diagnosis present

## 2019-02-08 DIAGNOSIS — D509 Iron deficiency anemia, unspecified: Secondary | ICD-10-CM | POA: Diagnosis not present

## 2019-02-08 DIAGNOSIS — Z7401 Bed confinement status: Secondary | ICD-10-CM | POA: Diagnosis not present

## 2019-02-08 DIAGNOSIS — M810 Age-related osteoporosis without current pathological fracture: Secondary | ICD-10-CM | POA: Diagnosis present

## 2019-02-08 DIAGNOSIS — R079 Chest pain, unspecified: Secondary | ICD-10-CM | POA: Diagnosis not present

## 2019-02-08 DIAGNOSIS — K5712 Diverticulitis of small intestine without perforation or abscess without bleeding: Secondary | ICD-10-CM | POA: Diagnosis present

## 2019-02-08 DIAGNOSIS — I5030 Unspecified diastolic (congestive) heart failure: Secondary | ICD-10-CM | POA: Diagnosis not present

## 2019-02-08 DIAGNOSIS — I340 Nonrheumatic mitral (valve) insufficiency: Secondary | ICD-10-CM | POA: Diagnosis not present

## 2019-02-08 DIAGNOSIS — E1142 Type 2 diabetes mellitus with diabetic polyneuropathy: Secondary | ICD-10-CM | POA: Diagnosis not present

## 2019-02-08 DIAGNOSIS — J189 Pneumonia, unspecified organism: Secondary | ICD-10-CM | POA: Diagnosis not present

## 2019-02-08 DIAGNOSIS — I10 Essential (primary) hypertension: Secondary | ICD-10-CM

## 2019-02-08 DIAGNOSIS — J9811 Atelectasis: Secondary | ICD-10-CM | POA: Diagnosis not present

## 2019-02-08 DIAGNOSIS — B9620 Unspecified Escherichia coli [E. coli] as the cause of diseases classified elsewhere: Secondary | ICD-10-CM | POA: Diagnosis present

## 2019-02-08 DIAGNOSIS — M6281 Muscle weakness (generalized): Secondary | ICD-10-CM | POA: Diagnosis not present

## 2019-02-08 DIAGNOSIS — I739 Peripheral vascular disease, unspecified: Secondary | ICD-10-CM | POA: Diagnosis not present

## 2019-02-08 DIAGNOSIS — I110 Hypertensive heart disease with heart failure: Secondary | ICD-10-CM | POA: Diagnosis present

## 2019-02-08 DIAGNOSIS — J9601 Acute respiratory failure with hypoxia: Secondary | ICD-10-CM | POA: Diagnosis not present

## 2019-02-08 DIAGNOSIS — K265 Chronic or unspecified duodenal ulcer with perforation: Secondary | ICD-10-CM | POA: Diagnosis present

## 2019-02-08 DIAGNOSIS — Z20828 Contact with and (suspected) exposure to other viral communicable diseases: Secondary | ICD-10-CM | POA: Diagnosis present

## 2019-02-08 DIAGNOSIS — R531 Weakness: Secondary | ICD-10-CM | POA: Diagnosis not present

## 2019-02-08 DIAGNOSIS — I4819 Other persistent atrial fibrillation: Secondary | ICD-10-CM | POA: Diagnosis present

## 2019-02-08 DIAGNOSIS — E785 Hyperlipidemia, unspecified: Secondary | ICD-10-CM | POA: Diagnosis present

## 2019-02-08 DIAGNOSIS — J90 Pleural effusion, not elsewhere classified: Secondary | ICD-10-CM | POA: Diagnosis not present

## 2019-02-08 DIAGNOSIS — F411 Generalized anxiety disorder: Secondary | ICD-10-CM | POA: Diagnosis not present

## 2019-02-08 DIAGNOSIS — R2681 Unsteadiness on feet: Secondary | ICD-10-CM | POA: Diagnosis not present

## 2019-02-08 DIAGNOSIS — E46 Unspecified protein-calorie malnutrition: Secondary | ICD-10-CM | POA: Diagnosis not present

## 2019-02-08 DIAGNOSIS — K5710 Diverticulosis of small intestine without perforation or abscess without bleeding: Secondary | ICD-10-CM

## 2019-02-08 DIAGNOSIS — R7881 Bacteremia: Secondary | ICD-10-CM | POA: Diagnosis present

## 2019-02-08 DIAGNOSIS — N390 Urinary tract infection, site not specified: Secondary | ICD-10-CM

## 2019-02-08 DIAGNOSIS — R0902 Hypoxemia: Secondary | ICD-10-CM | POA: Diagnosis not present

## 2019-02-08 DIAGNOSIS — E1151 Type 2 diabetes mellitus with diabetic peripheral angiopathy without gangrene: Secondary | ICD-10-CM | POA: Diagnosis present

## 2019-02-08 DIAGNOSIS — R0600 Dyspnea, unspecified: Secondary | ICD-10-CM | POA: Diagnosis not present

## 2019-02-08 DIAGNOSIS — K219 Gastro-esophageal reflux disease without esophagitis: Secondary | ICD-10-CM | POA: Diagnosis present

## 2019-02-08 DIAGNOSIS — R279 Unspecified lack of coordination: Secondary | ICD-10-CM | POA: Diagnosis not present

## 2019-02-08 DIAGNOSIS — Z951 Presence of aortocoronary bypass graft: Secondary | ICD-10-CM | POA: Diagnosis not present

## 2019-02-08 DIAGNOSIS — M4807 Spinal stenosis, lumbosacral region: Secondary | ICD-10-CM | POA: Diagnosis not present

## 2019-02-08 DIAGNOSIS — I5033 Acute on chronic diastolic (congestive) heart failure: Secondary | ICD-10-CM | POA: Diagnosis not present

## 2019-02-08 DIAGNOSIS — R10819 Abdominal tenderness, unspecified site: Secondary | ICD-10-CM | POA: Diagnosis not present

## 2019-02-08 DIAGNOSIS — M2550 Pain in unspecified joint: Secondary | ICD-10-CM | POA: Diagnosis not present

## 2019-02-08 DIAGNOSIS — N179 Acute kidney failure, unspecified: Secondary | ICD-10-CM | POA: Diagnosis present

## 2019-02-08 DIAGNOSIS — Z86718 Personal history of other venous thrombosis and embolism: Secondary | ICD-10-CM | POA: Diagnosis not present

## 2019-02-08 DIAGNOSIS — I361 Nonrheumatic tricuspid (valve) insufficiency: Secondary | ICD-10-CM | POA: Diagnosis not present

## 2019-02-08 DIAGNOSIS — R000 Tachycardia, unspecified: Secondary | ICD-10-CM | POA: Diagnosis not present

## 2019-02-08 DIAGNOSIS — R488 Other symbolic dysfunctions: Secondary | ICD-10-CM | POA: Diagnosis not present

## 2019-02-08 DIAGNOSIS — I4891 Unspecified atrial fibrillation: Secondary | ICD-10-CM | POA: Diagnosis not present

## 2019-02-08 DIAGNOSIS — R2689 Other abnormalities of gait and mobility: Secondary | ICD-10-CM | POA: Diagnosis not present

## 2019-02-08 DIAGNOSIS — F0390 Unspecified dementia without behavioral disturbance: Secondary | ICD-10-CM | POA: Diagnosis present

## 2019-02-08 DIAGNOSIS — Z888 Allergy status to other drugs, medicaments and biological substances status: Secondary | ICD-10-CM | POA: Diagnosis not present

## 2019-02-08 DIAGNOSIS — R140 Abdominal distension (gaseous): Secondary | ICD-10-CM | POA: Diagnosis not present

## 2019-02-08 DIAGNOSIS — F329 Major depressive disorder, single episode, unspecified: Secondary | ICD-10-CM | POA: Diagnosis not present

## 2019-02-08 DIAGNOSIS — I2510 Atherosclerotic heart disease of native coronary artery without angina pectoris: Secondary | ICD-10-CM | POA: Diagnosis present

## 2019-02-08 DIAGNOSIS — I5032 Chronic diastolic (congestive) heart failure: Secondary | ICD-10-CM | POA: Diagnosis not present

## 2019-02-08 HISTORY — DX: Chronic or unspecified duodenal ulcer with perforation: K26.5

## 2019-02-08 LAB — BLOOD CULTURE ID PANEL (REFLEXED)

## 2019-02-08 LAB — GLUCOSE, CAPILLARY
Glucose-Capillary: 104 mg/dL — ABNORMAL HIGH (ref 70–99)
Glucose-Capillary: 214 mg/dL — ABNORMAL HIGH (ref 70–99)

## 2019-02-08 LAB — HEMOGLOBIN A1C
Hgb A1c MFr Bld: 6.9 % — ABNORMAL HIGH (ref 4.8–5.6)
Mean Plasma Glucose: 151.33 mg/dL

## 2019-02-08 LAB — SARS CORONAVIRUS 2 (TAT 6-24 HRS): SARS Coronavirus 2: NEGATIVE

## 2019-02-08 MED ORDER — ONDANSETRON HCL 4 MG/2ML IJ SOLN: 4.0000 mg | Freq: Four times a day (QID) | INTRAMUSCULAR | Status: DC | PRN

## 2019-02-08 MED ORDER — SODIUM CHLORIDE 0.9 % IV SOLN
2.0000 g | INTRAVENOUS | Status: DC
  Administered 2019-02-08: 2 g via INTRAVENOUS
  Filled 2019-02-08 (×2): qty 20

## 2019-02-08 MED ORDER — SODIUM CHLORIDE 0.9 % IV SOLN
500.0000 mg | Freq: Two times a day (BID) | INTRAVENOUS | Status: DC
  Administered 2019-02-08 – 2019-02-09 (×2): 500 mg via INTRAVENOUS
  Filled 2019-02-08 (×4): qty 0.5

## 2019-02-08 MED ORDER — NYSTATIN 100000 UNIT/GM EX CREA
1.0000 "application " | TOPICAL_CREAM | Freq: Two times a day (BID) | CUTANEOUS | Status: DC
  Administered 2019-02-10 – 2019-02-19 (×16): 1 via TOPICAL
  Filled 2019-02-08 (×2): qty 15

## 2019-02-08 MED ORDER — PANTOPRAZOLE SODIUM 40 MG PO TBEC: 40.0000 mg | DELAYED_RELEASE_TABLET | Freq: Every day | ORAL | Status: DC

## 2019-02-08 MED ORDER — INSULIN ASPART 100 UNIT/ML ~~LOC~~ SOLN
0.0000 [IU] | Freq: Every day | SUBCUTANEOUS | Status: DC
  Administered 2019-02-10: 2 [IU] via SUBCUTANEOUS
  Administered 2019-02-14: 3 [IU] via SUBCUTANEOUS
  Filled 2019-02-08 (×2): qty 1

## 2019-02-08 MED ORDER — HYDROCODONE-ACETAMINOPHEN 5-325 MG PO TABS
0.5000 | ORAL_TABLET | Freq: Four times a day (QID) | ORAL | Status: DC | PRN
  Administered 2019-02-09 – 2019-02-20 (×12): 0.5 via ORAL
  Filled 2019-02-08 (×14): qty 1

## 2019-02-08 MED ORDER — LORATADINE 10 MG PO TABS
10.0000 mg | ORAL_TABLET | Freq: Every day | ORAL | Status: DC | PRN
  Administered 2019-02-13: 10 mg via ORAL
  Filled 2019-02-08: qty 1

## 2019-02-08 MED ORDER — DICLOFENAC SODIUM 1 % TD GEL
1.0000 "application " | Freq: Three times a day (TID) | TRANSDERMAL | Status: DC
  Administered 2019-02-09 – 2019-02-10 (×3): 1 via TOPICAL
  Filled 2019-02-08 (×2): qty 100

## 2019-02-08 MED ORDER — INSULIN ASPART 100 UNIT/ML ~~LOC~~ SOLN
0.0000 [IU] | Freq: Three times a day (TID) | SUBCUTANEOUS | Status: DC
  Administered 2019-02-09: 3 [IU] via SUBCUTANEOUS
  Administered 2019-02-09 (×2): 1 [IU] via SUBCUTANEOUS
  Administered 2019-02-10: 3 [IU] via SUBCUTANEOUS
  Administered 2019-02-10: 1 [IU] via SUBCUTANEOUS
  Administered 2019-02-10: 3 [IU] via SUBCUTANEOUS
  Administered 2019-02-11: 2 [IU] via SUBCUTANEOUS
  Administered 2019-02-11: 3 [IU] via SUBCUTANEOUS
  Administered 2019-02-11: 5 [IU] via SUBCUTANEOUS
  Administered 2019-02-12: 3 [IU] via SUBCUTANEOUS
  Administered 2019-02-12 – 2019-02-13 (×3): 2 [IU] via SUBCUTANEOUS
  Administered 2019-02-13: 1 [IU] via SUBCUTANEOUS
  Administered 2019-02-13 – 2019-02-14 (×2): 2 [IU] via SUBCUTANEOUS
  Administered 2019-02-14: 1 [IU] via SUBCUTANEOUS
  Administered 2019-02-14: 2 [IU] via SUBCUTANEOUS
  Administered 2019-02-15: 3 [IU] via SUBCUTANEOUS
  Administered 2019-02-15: 1 [IU] via SUBCUTANEOUS
  Administered 2019-02-15 – 2019-02-16 (×2): 3 [IU] via SUBCUTANEOUS
  Administered 2019-02-16 (×2): 1 [IU] via SUBCUTANEOUS
  Administered 2019-02-17 (×2): 2 [IU] via SUBCUTANEOUS
  Administered 2019-02-17: 1 [IU] via SUBCUTANEOUS
  Administered 2019-02-18 (×2): 2 [IU] via SUBCUTANEOUS
  Administered 2019-02-19: 3 [IU] via SUBCUTANEOUS
  Administered 2019-02-19 – 2019-02-20 (×4): 1 [IU] via SUBCUTANEOUS
  Administered 2019-02-20: 3 [IU] via SUBCUTANEOUS
  Filled 2019-02-08 (×35): qty 1

## 2019-02-08 MED ORDER — SODIUM CHLORIDE 0.9 % IV SOLN
80.0000 mg | Freq: Once | INTRAVENOUS | Status: AC
  Administered 2019-02-08: 80 mg via INTRAVENOUS
  Filled 2019-02-08: qty 80

## 2019-02-08 MED ORDER — ALPRAZOLAM 0.5 MG PO TABS
0.5000 mg | ORAL_TABLET | Freq: Four times a day (QID) | ORAL | Status: DC | PRN
  Administered 2019-02-08 – 2019-02-15 (×13): 0.5 mg via ORAL
  Filled 2019-02-08 (×14): qty 1

## 2019-02-08 MED ORDER — ACETAMINOPHEN 500 MG PO TABS
500.0000 mg | ORAL_TABLET | Freq: Four times a day (QID) | ORAL | Status: DC | PRN
  Administered 2019-02-08 – 2019-02-19 (×5): 500 mg via ORAL
  Filled 2019-02-08 (×6): qty 1

## 2019-02-08 MED ORDER — SODIUM CHLORIDE 0.9 % IV SOLN
INTRAVENOUS | Status: DC
  Administered 2019-02-08 – 2019-02-09 (×3): via INTRAVENOUS

## 2019-02-08 MED ORDER — GLUCOSAMINE-CHONDROITIN 500-400 MG PO TABS: 1.0000 | ORAL_TABLET | Freq: Every day | ORAL | Status: DC

## 2019-02-08 MED ORDER — PANTOPRAZOLE SODIUM 40 MG IV SOLR
40.0000 mg | Freq: Two times a day (BID) | INTRAVENOUS | Status: DC
  Administered 2019-02-11 – 2019-02-13 (×4): 40 mg via INTRAVENOUS
  Filled 2019-02-08 (×4): qty 40

## 2019-02-08 MED ORDER — ONDANSETRON HCL 4 MG PO TABS
4.0000 mg | ORAL_TABLET | Freq: Four times a day (QID) | ORAL | Status: DC | PRN
  Filled 2019-02-08: qty 1

## 2019-02-08 MED ORDER — VITAMIN E 180 MG (400 UNIT) PO CAPS
400.0000 [IU] | ORAL_CAPSULE | Freq: Every day | ORAL | Status: DC
  Administered 2019-02-08 – 2019-02-20 (×12): 400 [IU] via ORAL
  Filled 2019-02-08 (×15): qty 1

## 2019-02-08 MED ORDER — TRAZODONE HCL 50 MG PO TABS: 25.0000 mg | ORAL_TABLET | Freq: Every evening | ORAL | Status: DC | PRN

## 2019-02-08 MED ORDER — APIXABAN 5 MG PO TABS
5.0000 mg | ORAL_TABLET | Freq: Two times a day (BID) | ORAL | Status: DC
  Administered 2019-02-09 – 2019-02-20 (×23): 5 mg via ORAL
  Filled 2019-02-08 (×23): qty 1

## 2019-02-08 MED ORDER — TRAZODONE HCL 50 MG PO TABS
150.0000 mg | ORAL_TABLET | Freq: Every day | ORAL | Status: DC
  Administered 2019-02-08 – 2019-02-10 (×3): 150 mg via ORAL
  Filled 2019-02-08 (×3): qty 3

## 2019-02-08 MED ORDER — METOPROLOL TARTRATE 25 MG PO TABS
37.5000 mg | ORAL_TABLET | Freq: Two times a day (BID) | ORAL | Status: DC
  Administered 2019-02-08 (×2): 37.5 mg via ORAL
  Filled 2019-02-08 (×2): qty 2

## 2019-02-08 MED ORDER — ADULT MULTIVITAMIN W/MINERALS CH
1.0000 | ORAL_TABLET | Freq: Every day | ORAL | Status: DC
  Administered 2019-02-08 – 2019-02-20 (×12): 1 via ORAL
  Filled 2019-02-08 (×13): qty 1

## 2019-02-08 MED ORDER — MORPHINE SULFATE (PF) 2 MG/ML IV SOLN
2.0000 mg | INTRAVENOUS | Status: DC | PRN
  Administered 2019-02-08: 2 mg via INTRAVENOUS
  Filled 2019-02-08: qty 1

## 2019-02-08 MED ORDER — VITAMIN B-12 1000 MCG PO TABS
1000.0000 ug | ORAL_TABLET | Freq: Every day | ORAL | Status: DC
  Administered 2019-02-08 – 2019-02-20 (×13): 1000 ug via ORAL
  Filled 2019-02-08 (×13): qty 1

## 2019-02-08 MED ORDER — PRAVASTATIN SODIUM 20 MG PO TABS
20.0000 mg | ORAL_TABLET | Freq: Every day | ORAL | Status: DC
  Administered 2019-02-08 – 2019-02-20 (×13): 20 mg via ORAL
  Filled 2019-02-08 (×13): qty 1

## 2019-02-08 MED ORDER — COLLAGENASE 250 UNIT/GM EX OINT
TOPICAL_OINTMENT | Freq: Every day | CUTANEOUS | Status: DC
  Administered 2019-02-10: 09:00:00 via TOPICAL
  Filled 2019-02-08 (×2): qty 30

## 2019-02-08 MED ORDER — NITROGLYCERIN 0.4 MG SL SUBL: 0.4000 mg | SUBLINGUAL_TABLET | SUBLINGUAL | Status: DC | PRN

## 2019-02-08 MED ORDER — SODIUM CHLORIDE 0.9 % IV SOLN
8.0000 mg/h | INTRAVENOUS | Status: DC
  Administered 2019-02-08 – 2019-02-11 (×6): 8 mg/h via INTRAVENOUS
  Filled 2019-02-08 (×6): qty 80

## 2019-02-08 MED ORDER — METRONIDAZOLE IN NACL 5-0.79 MG/ML-% IV SOLN
500.0000 mg | Freq: Three times a day (TID) | INTRAVENOUS | Status: DC
  Administered 2019-02-08 (×2): 500 mg via INTRAVENOUS
  Filled 2019-02-08 (×2): qty 100

## 2019-02-08 NOTE — ED Notes (Signed)
Pt resting on stretcher watching tv. Very talkative. Iv fluids infusing without difficulty. . Pt c/o blood collecting around IV. Pt repositioned on stretcher and provided for comfort and safety and will continue to assess. No complaints of pain and no distress noted. Will continue to assess.

## 2019-02-08 NOTE — ED Notes (Signed)
Pt walked to bathroom with walker and stand by assist. Gait steady.

## 2019-02-08 NOTE — Consult Note (Signed)
[facility] GI Inpatient Consult Note   [name], M.D.  Reason for Consult: Abdominal pain, ?Perforated duodenal ulcer vs. Duodenal diverticulum with diverticulitis.   Attending Requesting Consult: Terrilee Croak, M.D.   History of Present Illness: [name] is a 85 y.o. female with a history of depression, diastolic dysfunction, DVT, and atrial fibrillation presenting for progressive abdominal pain for the last month.  Patient says the pain is in the epigastrium right upper quadrant that does not radiate.  Patient is very tearful noting her son died approximately 2 years ago this week.  She is emotionally upset.  She also has symptoms of dysuria and has been diagnosed with a urinary tract infection.  Patient denies any hematemesis, melena or hematochezia.  Patient underwent a colonoscopy about 9 years ago in [place] by Dr. Richmond Campbell; I reviewed the procedure report revealing no evidence of colon polyps or colon cancer.  Patient remarks she has never undergone previous EGD. Our service was consulted today for complaints of abdominal pain with findings  on CT scan without contrast revealing a inflamed area in the duodenum suggestive of possible contained duodenal ulcer perforation versus inflamed diverticulum.  Surgical service, Dr. Lysle Pearl, was consulted late last evening and reviewed x-rays and examined the patient.  Patient was deemed to have a nonsurgical abdomen and opinion was shared that the area was probably duodenal diverticulitis and not a contained perforated ulcer. I reviewed the labs as well as all the x-rays including a CT scan of the abdomen and pelvis in 2014 noting a duodenal diverticulum (at that time noninflamed) on abdominal CT scan.  Past Medical History:  Past Medical History:  Diagnosis Date  . Acute deep vein thrombosis (DVT) of distal vein of right lower extremity ([place]) 12/2013   Acute thrombus of R proximal gastrocnemius vein. Symptomatic provoked  distal DVT  . Anxiety   . Cellulitis of leg, left 01/16/2014  . Chronic back pain 03/2012   lumbar DDD with herniation - s/p L S1 transforaminal ESI (10/2012 Dr. Sharlet Salina)  . Chronic venous insufficiency   . Coronary artery disease    CABG 1998  . Cystocele   . Depression   . Diabetes mellitus    type 2, followed by endo.  . Diastolic dysfunction   . Diverticulosis   . Hemorrhoids   . History of heart attack   . Hx of echocardiogram    a. Echo 10/13: mild LVH, EF 55-60%, Gr 1 diast dysfn, PASP 32  . Hyperlipidemia   . Hypertension   . Irritable bowel syndrome   . Microcytic anemia   . PAD (peripheral artery disease) ([place]) 2013   ABI: R 0.91, L 0.83  . Postmenopausal osteoporosis 01/2019   T score of -2.6  . PUD (peptic ulcer disease)   . Varicose veins   . Venous ulcer of leg ([place]) 12/12/2013   Required prolonged treatment by wound center  . Viral gastroenteritis due to [place] virus 05/13/2015   hospitalization    Problem List: Patient Active Problem List   Diagnosis Date Noted  . Duodenal ulcer perforation ([place]) 02/08/2019  . Chronic leg pain 11/14/2018  . Atherosclerosis of native arteries of the extremities with ulceration ([place]) 08/21/2018  . Lumbar strain, initial encounter 07/18/2018  . Chronic constipation 06/18/2018  . Erythema of breast 04/18/2018  . MRSA cellulitis 03/31/2018  . Advanced directives, counseling/discussion 01/29/2018  . Bilateral hearing loss 01/29/2018  . Spinal stenosis of lumbosacral region 01/08/2018  . Acute right-sided low  back pain 12/26/2017  . Leg wound, right, subsequent encounter 10/30/2017  . Systolic murmur 16/96/7893  . De Quervain's tenosynovitis, left 01/09/2017  . Encounter for chronic pain management 02/09/2016  . Paroxysmal atrial fibrillation (H[facility])   . Varicose veins of left lower extremity with both ulcer of calf and inflammation ([place]) 05/22/2015  . Varicose veins of right lower extremity with both ulcer of calf and  inflammation ([place]) 01/23/2015  . Diabetes mellitus type 2 with peripheral artery disease ([place])   . Chronic venous insufficiency   . Bleeding from varicose veins of lower extremity, left 02/13/2014  . Diabetic polyneuropathy ([place]) 11/19/2013  . Type 2 diabetes mellitus with neurological manifestations, controlled ([place]) 11/19/2013  . Right shoulder pain 08/19/2013  . Cerumen impaction 04/16/2013  . Abdominal pain 05/14/2012  . Peptic ulcer disease   . Unsteady gait 12/13/2011  . Chronic lower back pain 09/14/2011  . Microcytic anemia 09/06/2010  . Hyperlipidemia 09/06/2010  . GAD (generalized anxiety disorder) 03/15/2010  . Obesity, Class I, BMI 30-34.9 03/15/2010  . Hypertension 03/15/2010  . (HFpEF) heart failure with preserved ejection fraction ([place]) 03/15/2010  . Coronary artery disease     Past Surgical History: Past Surgical History:  Procedure Laterality Date  . ABDOMINAL HYSTERECTOMY  1975   TAH,BSO  . ABI  2013   R 0.91, L 0.83  . APPENDECTOMY     per pt report  . CARDIOVASCULAR STRESS TEST  2015   low risk myoview ([place])  . CATARACT SURGERY    . CHOLECYSTECTOMY    . COLONOSCOPY  02/2010   extensive diverticulosis throughout, small internal hemorrhoids, rec rpt 5 yrs (Dr. Allyn Kenner)  . CORONARY ARTERY BYPASS GRAFT  1998  . dexa scan  09/2011   femur -0.8, forearm 0.6 => normal  . ENDOVENOUS ABLATION SAPHENOUS VEIN W/ LASER Left 03/2014   Kellie Simmering  . ESI  2014   L S1 transforaminal ESI x2 ([place]) without improvement  . HAND SURGERY    . lexiscan myoview  03/2011   Small basal inferolateral and anterolateral reversible perfusion defect suggests ischemia.  EF was normal.   old infarct, no new ischemia  . LOWER EXTREMITY ANGIOGRAPHY Right 09/06/2018   Procedure: LOWER EXTREMITY ANGIOGRAPHY;  Surgeon: Algernon Huxley, MD;  Location: [facility];  Service: Cardiovascular;  Laterality: Right;  . toe surgery    . TYMPANOPLASTY  1960s   R side  . Vault prolapse A &  P repair  2009   Dr Rhodia Albright [facility] hospital    Allergies: Allergies  Allergen Reactions  . Metformin And Related Diarrhea    Home Medications: (Not in a hospital admission)  Home medication reconciliation was completed with the patient.   Scheduled Inpatient Medications:   . collagenase   Topical Daily  . diclofenac sodium  1 application Topical TID  . insulin aspart  0-5 Units Subcutaneous QHS  . insulin aspart  0-9 Units Subcutaneous TID WC  . metoprolol tartrate  37.5 mg Oral BID  . multivitamin with minerals  1 tablet Oral Daily  . nystatin cream  1 application Topical BID  . [START ON 02/11/2019] pantoprazole  40 mg Intravenous Q12H  . pravastatin  20 mg Oral Daily  . traZODone  150 mg Oral QHS  . vitamin B-12  1,000 mcg Oral Daily  . vitamin E  400 Units Oral Daily    Continuous Inpatient Infusions:   . sodium chloride 100 mL/hr at 02/08/19 0521  . meropenem (MERREM)  IV    . pantoprozole (PROTONIX) infusion 8 mg/hr (02/08/19 1219)    PRN Inpatient Medications:  acetaminophen, ALPRAZolam, HYDROcodone-acetaminophen, loratadine, morphine injection, nitroGLYCERIN, ondansetron **OR** ondansetron (ZOFRAN) IV  Family History: family history includes Diabetes in her brother, father, and sister; Heart disease in her son; Hypertension in her mother; Stroke in her mother.   GI Family History: Son died of a "blood disease".  Social History:   reports that she quit smoking about 39 years ago. She has never used smokeless tobacco. She reports that she does not drink alcohol or use drugs. The patient denies ETOH, tobacco, or drug use.    Review of Systems: Review of Systems - Negative except for HPI above  Physical Examination: BP (!) 136/91 (BP Location: Right Arm)   Pulse (!) 131   Temp 99 F (37.2 C)   Resp (!) 28   Ht 4\' 9"  (1.448 m)   Wt 65.3 kg   SpO2 95%   BMI 31.15 kg/m  Physical Exam Constitutional:      Appearance: She is not toxic-appearing or  diaphoretic.  HENT:     Head: Normocephalic and atraumatic.     Nose: Nose normal.  Eyes:     General: No scleral icterus.    Extraocular Movements: Extraocular movements intact.     Pupils: Pupils are equal, round, and reactive to light.  Cardiovascular:     Rate and Rhythm: Rhythm irregular.     Heart sounds: No murmur. No gallop.   Pulmonary:     Effort: Pulmonary effort is normal. No respiratory distress.     Breath sounds: Normal breath sounds. No stridor. No rhonchi.  Abdominal:     General: There is distension.     Palpations: There is no mass.     Tenderness: There is abdominal tenderness. There is no right CVA tenderness, left CVA tenderness, guarding or rebound.     Hernia: No hernia is present.  Skin:    General: Skin is warm and dry.  Neurological:     General: No focal deficit present.     Mental Status: She is alert.  Psychiatric:        Attention and Perception: Attention and perception normal. She does not perceive auditory or visual hallucinations.        Mood and Affect: Mood is anxious. Affect is tearful.        Speech: Speech normal.        Behavior: Behavior is not agitated. Behavior is cooperative.        Thought Content: Thought content is not paranoid. Thought content does not include homicidal or suicidal plan.        Cognition and Memory: Cognition and memory normal.        Judgment: Judgment normal.     Data: Lab Results  Component Value Date   WBC 25.7 (H) 02/07/2019   HGB 12.4 02/07/2019   HCT 39.7 02/07/2019   MCV 95.9 02/07/2019   PLT 186 02/07/2019   Recent Labs  Lab 02/04/19 1409 02/07/19 1639  HGB 12.8 12.4   Lab Results  Component Value Date   NA 137 02/07/2019   K 4.2 02/07/2019   CL 100 02/07/2019   CO2 26 02/07/2019   BUN 31 (H) 02/07/2019   CREATININE 1.60 (H) 02/07/2019   Lab Results  Component Value Date   ALT 18 02/07/2019   AST 26 02/07/2019   ALKPHOS 94 02/07/2019   BILITOT 0.8 02/07/2019   No results for  input(s): APTT, INR, PTT in the last 168 hours. CBC Latest Ref Rng & Units 02/07/2019 02/04/2019 06/21/2018  WBC 4.0 - 10.5 K/uL 25.7(H) 8.8 15.5(H)  Hemoglobin 12.0 - 15.0 g/dL 12.4 12.8 13.2  Hematocrit 36.0 - 46.0 % 39.7 39.3 40.9  Platelets 150 - 400 K/uL 186 229.0 233    STUDIES: Ct Abdomen Pelvis Wo Contrast  Result Date: 02/07/2019 CLINICAL DATA:  Acute generalized abdominal pain, fevers chills and right-sided abdominal/flank pain EXAM: CT ABDOMEN AND PELVIS WITHOUT CONTRAST TECHNIQUE: Multidetector CT imaging of the abdomen and pelvis was performed following the standard protocol without IV contrast. COMPARISON:  CT abdomen pelvis 07/30/2012 FINDINGS: Lower chest: Streaky atelectatic changes are present in the lung bases. Moderate cardiomegaly with four-chamber enlargement. Mitral annular and aortic leaflet calcifications are present. Coronary artery calcifications are present. No pericardial effusion. Hepatobiliary: No focal liver abnormality is seen. Patient is post cholecystectomy. Slight prominence of the biliary tree likely related to reservoir effect. No calcified intraductal gallstones. Pancreas: Unremarkable. No pancreatic ductal dilatation or surrounding inflammatory changes. Spleen: Normal in size without focal abnormality. Adrenals/Urinary Tract: Stable nodular thickening of the adrenal glands without discrete concerning adrenal nodule. Kidneys are unremarkable, without renal calculi, suspicious lesion, or hydronephrosis. Bladder is slightly displaced rightward though this configuration is similar to comparison CT from 2014. No other bladder abnormality. Stomach/Bowel: Distal esophagus and stomach are unremarkable. There is a fluid-filled outpouching arising from the first portion of the duodenum with some irregular mural thickening and adjacent inflammation (2/34, 5/43). No small bowel dilatation or wall thickening. The appendix appears surgically absent. There is extensive pancolonic  diverticulosis without evidence of acute peridiverticular inflammation. No colonic dilatation or wall thickening. Vascular/Lymphatic: Extensive atherosclerotic plaque of the aorta and branch vessels. Tortuosity of the abdominal aorta, a likely senescent change. No suspicious or enlarged lymph nodes in the included lymphatic chains. Reactive appearing upper abdominal lymph nodes are present. Reproductive: Uterus is surgically absent. No concerning adnexal lesions. Other: No abdominopelvic free fluid or free gas. No bowel containing hernias. Small fat containing umbilical hernia mild body wall edema. Significant pelvic floor laxity with likely cyst seal. Musculoskeletal: No acute osseous abnormality or suspicious osseous lesion. Multilevel degenerative changes are present in the imaged portions of the spine. There is dextrocurvature of the thoracolumbar spine centered at the thoracolumbar junction. Sclerotic changes of the SI joints predominantly ileal side may reflect sequela of prior osteitis condensans. IMPRESSION: 1. Fluid-filled outpouching arising from the first portion of the duodenum with some irregular mural thickening and adjacent inflammation, concerning for contained perforation of a duodenal ulcer or inflamed duodenal diverticulum. 2. Extensive noninflamed pancolonic diverticulosis. 3. Pelvic floor laxity with likely cystocele. 4. Aortic Atherosclerosis (ICD10-I70.0). 5. Cardiomegaly and four-chamber cardiac enlargement. Coronary atherosclerosis. Electronically Signed   By: Lovena Le M.D.   On: 02/07/2019 23:23   @IMAGES @  Assessment:  1. Abdominal pain - NO peritoneal signs on examination. Much of pain is not new, but chronic, Suprapubic pain is more concerning at this time given UTI diagnosis. The finding of the duodenal outpouching in my opinion is a red herring and likley has nothing to do with the patient's pain complaint. There also appears to be a strong possibility of somatization of the  pain given the 2nd anniversary of her son's death.   2. Duodenal diverticulum - This was also noted on 2014 CT scan of the abdomen and pelvis. I assume this correlates with current CT scan findings which appear to pose no risk to  beginning a diet.  3. Urinary tract infection.  4. PAF - Anticoagulation held.   COVID-19 status: Testing pending  Recommendations:  1. Resume anticoagulation.  2. I will order heart healthy diet.  3. Notified attending physician of my recommendations.  4. Certainly there is no plan for endoscopy unless patient fails to tolerate diet.  5. I will follow only peripherally unless issues arise.   Thank you for the consult. Please call with questions or concerns.  Olean Ree, "Lanny Hurst MD [facility] [place], [place] [address] [phone]  02/08/2019 4:18 PM

## 2019-02-08 NOTE — Progress Notes (Signed)
PHARMACY - PHYSICIAN COMMUNICATION CRITICAL VALUE ALERT - BLOOD CULTURE IDENTIFICATION (BCID)  Results for orders placed or performed during the hospital encounter of 02/07/19  Blood Culture ID Panel (Reflexed) (Collected: 02/08/2019  1:33 AM)  Result Value Ref Range   Enterococcus species NOT DETECTED NOT DETECTED   Listeria monocytogenes NOT DETECTED NOT DETECTED   Staphylococcus species NOT DETECTED NOT DETECTED   Staphylococcus aureus (BCID) NOT DETECTED NOT DETECTED   Streptococcus species NOT DETECTED NOT DETECTED   Streptococcus agalactiae NOT DETECTED NOT DETECTED   Streptococcus pneumoniae NOT DETECTED NOT DETECTED   Streptococcus pyogenes NOT DETECTED NOT DETECTED   Acinetobacter baumannii NOT DETECTED NOT DETECTED   Enterobacteriaceae species DETECTED (A) NOT DETECTED   Enterobacter cloacae complex NOT DETECTED NOT DETECTED   Escherichia coli DETECTED (A) NOT DETECTED   Klebsiella oxytoca NOT DETECTED NOT DETECTED   Klebsiella pneumoniae NOT DETECTED NOT DETECTED   Proteus species NOT DETECTED NOT DETECTED   Serratia marcescens NOT DETECTED NOT DETECTED   Carbapenem resistance NOT DETECTED NOT DETECTED   Haemophilus influenzae NOT DETECTED NOT DETECTED   Neisseria meningitidis NOT DETECTED NOT DETECTED   Pseudomonas aeruginosa NOT DETECTED NOT DETECTED   Candida albicans NOT DETECTED NOT DETECTED   Candida glabrata NOT DETECTED NOT DETECTED   Candida krusei NOT DETECTED NOT DETECTED   Candida parapsilosis NOT DETECTED NOT DETECTED   Candida tropicalis NOT DETECTED NOT DETECTED    Name of physician (or Provider) Contacted: Dahal  Changes to prescribed antibiotics required:  Will d/c metronidazole and ceftriaxone and start meropenem.   Anita Mcadory D 02/08/2019  3:15 PM

## 2019-02-08 NOTE — Progress Notes (Addendum)
Subjective:  CC: [name] is a 85 y.o. female  Hospital stay day 0,   duodenal diverticulitis  HPI: No issues since last exam.  Epigastric pain remains stable. States she is hungry.  She now is complaining of suprapubic pain  ROS:  General: Denies weight loss, weight gain, fatigue, fevers, chills, and night sweats. Heart: Denies chest pain, palpitations, racing heart, irregular heartbeat, leg pain or swelling, and decreased activity tolerance. Respiratory: Denies breathing difficulty, shortness of breath, wheezing, cough, and sputum. GI: Denies change in appetite, heartburn, nausea, vomiting, constipation, diarrhea, and blood in stool. GU: Denies difficulty urinating, pain with urinating, urgency, frequency, blood in urine.   Objective:   Temp:  [98.1 F (36.7 C)-98.9 F (37.2 C)] 98.9 F (37.2 C) (11/06 0902) Pulse Rate:  [80-122] 122 (11/06 0902) Resp:  [16-20] 16 (11/06 0902) BP: (95-142)/(55-84) 142/84 (11/06 0902) SpO2:  [90 %-98 %] 90 % (11/06 0902) Weight:  [65.3 kg] 65.3 kg (11/05 2041)     Height: 4\' 9"  (144.8 cm) Weight: 65.3 kg BMI (Calculated): 31.14   Intake/Output this shift:   Intake/Output Summary (Last 24 hours) at 02/08/2019 1032 Last data filed at 02/08/2019 0317 Gross per 24 hour  Intake 50 ml  Output -  Net 50 ml    Constitutional :  alert, cooperative and no distress  Respiratory:  clear to auscultation bilaterally  Cardiovascular:  irregularly irregular rhythm  Gastrointestinal: soft, no guarding, complains of focal tendernss worst in epigastric/RUQ region, minor tenderness in suprapubic area.   Skin: Cool and moist.   Psychiatric: Normal affect, non-agitated, not confused       LABS:  CMP Latest Ref Rng & Units 02/07/2019 02/04/2019 09/04/2018  Glucose 70 - 99 mg/dL 100(H) 94 86  BUN 8 - 23 mg/dL 31(H) 25(H) 20  Creatinine 0.44 - 1.00 mg/dL 1.60(H) 0.98 0.86  Sodium 135 - 145 mmol/L 137 140 138  Potassium 3.5 - 5.1 mmol/L 4.2 4.7 4.6   Chloride 98 - 111 mmol/L 100 102 101  CO2 22 - 32 mmol/L 26 31 33(H)  Calcium 8.9 - 10.3 mg/dL 9.2 9.3 9.2  Total Protein 6.5 - 8.1 g/dL 6.7 6.3 6.4  Total Bilirubin 0.3 - 1.2 mg/dL 0.8 0.5 0.4  Alkaline Phos 38 - 126 U/L 94 113 114  AST 15 - 41 U/L 26 17 20   ALT 0 - 44 U/L 18 14 15    CBC Latest Ref Rng & Units 02/07/2019 02/04/2019 06/21/2018  WBC 4.0 - 10.5 K/uL 25.7(H) 8.8 15.5(H)  Hemoglobin 12.0 - 15.0 g/dL 12.4 12.8 13.2  Hematocrit 36.0 - 46.0 % 39.7 39.3 40.9  Platelets 150 - 400 K/uL 186 229.0 233    RADS: n/a Assessment:   Duodenal diverticulitis.  Pt overall status remains stable since close to 10hrs ago.  Highly unlikely to be perforation requiring any surgical intervention at this point.  Recommended continuing current regimen for now including abx for diverticulitis and possibly UTI.  Await GI input as well prior to considering restarting diet and/or anticoagulation.   This encounter lasted >34min.

## 2019-02-08 NOTE — ED Notes (Signed)
Lab called for blood cultures due to difficult stick 

## 2019-02-08 NOTE — ED Notes (Signed)
Pt walked to bathroom with walker and standby assist.

## 2019-02-08 NOTE — Telephone Encounter (Signed)
Spoke with pt to get an update.  States she is currently in [facility].  She was admitted yesterday via ED visit.  Fyi to Dr. Darnell Level.

## 2019-02-08 NOTE — ED Notes (Signed)
MD Dahal made aware of pt HR and low grade temp. PT in NAD at this time, will continue to monitor

## 2019-02-08 NOTE — H&P (Signed)
[facility] NAME: [name]    MR#:  [id number]  DATE OF BIRTH:  [birth year]  DATE OF ADMISSION: 02/08/2019  PRIMARY CARE PHYSICIAN: Ria Bush, MD   REQUESTING/REFERRING PHYSICIAN: Marjean Donna, MD  CHIEF COMPLAINT:   Chief Complaint  Patient presents with  . Abnormal Lab  Abdominal pain  HISTORY OF PRESENT ILLNESS:  [name]  is a 85 y.o. Caucasian female with a known history of multiple medical problems that will be mentioned below, including type 2 diabetes mellitus, hypertension, diverticulosis and depression who presented to the emergency room with acute onset of abdominal pain described as sharp and extending from epigastric area to her mid abdomen and right upper quadrant with no nausea or vomiting or fever or chills.  She admitted to mild diarrhea with loose bowel movements.  No chest pain or dyspnea or palpitations.  No cough or wheezing no headache or dizziness or blurred vision.  She admitted to dyspnea on exertion.  No lower extremity edema.  Upon presentation to the emergency room, blood pressure was 124/66 with a pulse of 106, temperature of 98.1 with pulse oximetry of 98% on room air.  Labs revealed a BUN of 31 and creatinine 1.6 with significant leukocytosis 25.7.  COVID-19 test is currently pending.  UA was positive for UTI.  Blood cultures as well as urine culture were sent.  Abdominal pelvic CT scan revealed : 1. Fluid-filled outpouching arising from the first portion of the duodenum with some irregular mural thickening and adjacent inflammation, concerning for contained perforation of a duodenal ulcer or inflamed duodenal diverticulum. 2. Extensive noninflamed pancolonic diverticulosis. 3. Pelvic floor laxity with likely cystocele. 4. Aortic Atherosclerosis (ICD10-I70.0). 5. Cardiomegaly and four-chamber cardiac enlargement. Coronary atherosclerosis.  The patient was given IV Zosyn in the ER.  Dr. Lysle Pearl was  notified about the patient.  The patient will be admitted to them a MedSurg bed for further evaluation and management.  PAST MEDICAL HISTORY:   Past Medical History:  Diagnosis Date  . Acute deep vein thrombosis (DVT) of distal vein of right lower extremity ([place]) 12/2013   Acute thrombus of R proximal gastrocnemius vein. Symptomatic provoked distal DVT  . Anxiety   . Cellulitis of leg, left 01/16/2014  . Chronic back pain 03/2012   lumbar DDD with herniation - s/p L S1 transforaminal ESI (10/2012 Dr. Sharlet Salina)  . Chronic venous insufficiency   . Coronary artery disease    CABG 1998  . Cystocele   . Depression   . Diabetes mellitus    type 2, followed by endo.  . Diastolic dysfunction   . Diverticulosis   . Hemorrhoids   . History of heart attack   . Hx of echocardiogram    a. Echo 10/13: mild LVH, EF 55-60%, Gr 1 diast dysfn, PASP 32  . Hyperlipidemia   . Hypertension   . Irritable bowel syndrome   . Microcytic anemia   . PAD (peripheral artery disease) ([place]) 2013   ABI: R 0.91, L 0.83  . Postmenopausal osteoporosis 01/2019   T score of -2.6  . PUD (peptic ulcer disease)   . Varicose veins   . Venous ulcer of leg ([place]) 12/12/2013   Required prolonged treatment by wound center  . Viral gastroenteritis due to [place] virus 05/13/2015   hospitalization    PAST SURGICAL HISTORY:   Past Surgical History:  Procedure Laterality Date  . ABDOMINAL HYSTERECTOMY  1975   TAH,BSO  . ABI  2013   R 0.91, L 0.83  . APPENDECTOMY     per pt report  . CARDIOVASCULAR STRESS TEST  2015   low risk myoview (Nahser)  . CATARACT SURGERY    . CHOLECYSTECTOMY    . COLONOSCOPY  02/2010   extensive diverticulosis throughout, small internal hemorrhoids, rec rpt 5 yrs (Dr. Allyn Kenner)  . CORONARY ARTERY BYPASS GRAFT  1998  . dexa scan  09/2011   femur -0.8, forearm 0.6 => normal  . ENDOVENOUS ABLATION SAPHENOUS VEIN W/ LASER Left 03/2014   Kellie Simmering  . ESI  2014   L S1 transforaminal ESI x2  ([place]) without improvement  . HAND SURGERY    . lexiscan myoview  03/2011   Small basal inferolateral and anterolateral reversible perfusion defect suggests ischemia.  EF was normal.   old infarct, no new ischemia  . LOWER EXTREMITY ANGIOGRAPHY Right 09/06/2018   Procedure: LOWER EXTREMITY ANGIOGRAPHY;  Surgeon: Algernon Huxley, MD;  Location: [facility];  Service: Cardiovascular;  Laterality: Right;  . toe surgery    . TYMPANOPLASTY  1960s   R side  . Vault prolapse A & P repair  2009   Dr Rhodia Albright [facility] hospital    SOCIAL HISTORY:   Social History   Tobacco Use  . Smoking status: Former Smoker    Quit date: 03/05/1979    Years since quitting: 39.9  . Smokeless tobacco: Never Used  Substance Use Topics  . Alcohol use: No    Alcohol/week: 0.0 standard drinks    FAMILY HISTORY:   Family History  Problem Relation Age of Onset  . Hypertension Mother   . Stroke Mother   . Diabetes Father   . Diabetes Sister   . Diabetes Brother   . Heart disease Son   . Colon cancer Neg Hx     DRUG ALLERGIES:   Allergies  Allergen Reactions  . Metformin And Related Diarrhea    REVIEW OF SYSTEMS:   ROS As per history of present illness. All pertinent systems were reviewed above. Constitutional,  HEENT, cardiovascular, respiratory, GI, GU, musculoskeletal, neuro, psychiatric, endocrine,  integumentary and hematologic systems were reviewed and are otherwise  negative/unremarkable except for positive findings mentioned above in the HPI.   MEDICATIONS AT HOME:   Prior to Admission medications   Medication Sig Start Date End Date Taking? Authorizing Provider  acetaminophen (TYLENOL) 500 MG tablet Take 500 mg by mouth every 6 (six) hours as needed for mild pain.   Yes [provider]  ALPRAZolam Duanne Moron) 1 MG tablet Take 0.5 mg by mouth 4 (four) times daily as needed for anxiety.    Yes [provider]  apixaban (ELIQUIS) 5 MG TABS tablet Take 1 tablet (5 mg  total) by mouth 2 (two) times daily. 12/13/18  Yes Nahser, Wonda Cheng, MD  collagenase (SANTYL) ointment Apply topically daily. 03/27/18  Yes Mody, Ulice Bold, MD  diclofenac sodium (VOLTAREN) 1 % GEL Apply 1 application topically 3 (three) times daily. 01/09/17  Yes Ria Bush, MD  furosemide (LASIX) 20 MG tablet Take 0.5 tablets (10 mg total) by mouth daily. 12/05/18  Yes Ria Bush, MD  glucosamine-chondroitin 500-400 MG tablet Take 1 tablet by mouth daily.    Yes [provider]  glucose blood (ONETOUCH VERIO) test strip USE TO TEST BLOOD SUGAR ONCE DAILY. DX:E11.9 01/21/19  Yes Elayne Snare, MD  HYDROcodone-acetaminophen (NORCO/VICODIN) 5-325 MG tablet Take 0.5 tablets by mouth every 6 (six) hours as needed for moderate pain.  01/14/19  Yes Ria Bush, MD  loperamide (IMODIUM) 2 MG capsule Take 2 mg by mouth 4 (four) times daily as needed for diarrhea or loose stools.   Yes [provider]  loratadine (CLARITIN) 10 MG tablet Take 10 mg by mouth daily as needed for allergies.   Yes [provider]  metoprolol tartrate (LOPRESSOR) 25 MG tablet Take 1.5 tablets (37.5 mg total) by mouth 2 (two) times daily. 01/22/19 04/22/19 Yes Weaver, Scott T, PA-C  Multiple Vitamin (MULTIVITAMIN) tablet Take 1 tablet by mouth daily. Patient reports takes a new vitamin to replace her centrum as recommended by her eye doctor.   Yes [provider]  nystatin cream (MYCOSTATIN) Apply 1 application topically 2 (two) times daily. 09/04/18  Yes Ria Bush, MD  nystatin-triamcinolone ointment (MYCOLOG) APPLY TO AFFECTED AREA TWICE A DAY AS NEEDED FOR ITCHING 01/03/19  Yes Fontaine, Belinda Block, MD  pantoprazole (PROTONIX) 40 MG tablet TAKE 1 TABLET BY MOUTH EVERY DAY 10/22/18  Yes Ria Bush, MD  pioglitazone (ACTOS) 30 MG tablet TAKE 1 TABLET BY MOUTH EVERY DAY 01/28/19  Yes Elayne Snare, MD  polyethylene glycol powder (GLYCOLAX/MIRALAX) powder Take 17 g by mouth daily.  Hold for diarrhea 06/18/18  Yes Ria Bush, MD  pravastatin (PRAVACHOL) 20 MG tablet TAKE 1 TABLET BY MOUTH EVERY DAY 02/04/19  Yes Ria Bush, MD  Probiotic Product (ALIGN) 4 MG CAPS Take 1 capsule (4 mg total) by mouth daily. 08/15/17  Yes Ria Bush, MD  ramipril (ALTACE) 5 MG capsule TAKE ONE CAPSULE BY MOUTH EVERYDAY FOR BLOOD PRESSURE 09/01/17  Yes Ria Bush, MD  traZODone (DESYREL) 150 MG tablet Take 150 mg by mouth at bedtime.    Yes [provider]  triamcinolone cream (KENALOG) 0.1 % APPLY TO AFFECTED AREA TWICE A DAY 09/10/18  Yes Ria Bush, MD  vitamin B-12 (CYANOCOBALAMIN) 1000 MCG tablet Take 1,000 mcg by mouth daily.   Yes [provider]  vitamin E 400 UNIT capsule Take 400 Units by mouth daily.   Yes [provider]  nitroGLYCERIN (NITROSTAT) 0.4 MG SL tablet Place 1 tablet (0.4 mg total) under the tongue every 5 (five) minutes as needed for chest pain. 02/02/17   Nahser, Wonda Cheng, MD      VITAL SIGNS:  Blood pressure 111/73, pulse 85, temperature 98.1 F (36.7 C), temperature source Oral, resp. rate 16, height 4\' 9"  (1.448 m), weight 65.3 kg, SpO2 98 %.  PHYSICAL EXAMINATION:  Physical Exam  GENERAL:  85 y.o.-year-old Caucasian female patient lying in the bed with no acute distress.  EYES: Pupils equal, round, reactive to light and accommodation. No scleral icterus. Extraocular muscles intact.  HEENT: Head atraumatic, normocephalic. Oropharynx and nasopharynx clear.  NECK:  Supple, no jugular venous distention. No thyroid enlargement, no tenderness.  LUNGS: Normal breath sounds bilaterally, no wheezing, rales,rhonchi or crepitation. No use of accessory muscles of respiration.  CARDIOVASCULAR: Regular rate and rhythm, S1, S2 normal. No murmurs, rubs, or gallops.  ABDOMEN: Soft, nondistended, with epigastric periumbilical and slight right upper quadrant tenderness without rebound tenderness or guarding or rigidity.   Bowel sounds present. No organomegaly or mass.  EXTREMITIES: No pedal edema, cyanosis, or clubbing.  NEUROLOGIC: Cranial nerves II through XII are intact. Muscle strength 5/5 in all extremities. Sensation intact. Gait not checked.  PSYCHIATRIC: The patient is alert and oriented x 3.  Normal affect and good eye contact. SKIN: No obvious rash, lesion, or ulcer.   LABORATORY PANEL:   CBC  Recent Labs  Lab 02/07/19 1639  WBC 25.7*  HGB 12.4  HCT 39.7  PLT 186   ------------------------------------------------------------------------------------------------------------------  Chemistries  Recent Labs  Lab 02/07/19 1639  NA 137  K 4.2  CL 100  CO2 26  GLUCOSE 100*  BUN 31*  CREATININE 1.60*  CALCIUM 9.2  AST 26  ALT 18  ALKPHOS 94  BILITOT 0.8   ------------------------------------------------------------------------------------------------------------------  Cardiac Enzymes No results for input(s): TROPONINI in the last 168 hours. ------------------------------------------------------------------------------------------------------------------  RADIOLOGY:  Ct Abdomen Pelvis Wo Contrast  Result Date: 02/07/2019 CLINICAL DATA:  Acute generalized abdominal pain, fevers chills and right-sided abdominal/flank pain EXAM: CT ABDOMEN AND PELVIS WITHOUT CONTRAST TECHNIQUE: Multidetector CT imaging of the abdomen and pelvis was performed following the standard protocol without IV contrast. COMPARISON:  CT abdomen pelvis 07/30/2012 FINDINGS: Lower chest: Streaky atelectatic changes are present in the lung bases. Moderate cardiomegaly with four-chamber enlargement. Mitral annular and aortic leaflet calcifications are present. Coronary artery calcifications are present. No pericardial effusion. Hepatobiliary: No focal liver abnormality is seen. Patient is post cholecystectomy. Slight prominence of the biliary tree likely related to reservoir effect. No calcified intraductal gallstones.  Pancreas: Unremarkable. No pancreatic ductal dilatation or surrounding inflammatory changes. Spleen: Normal in size without focal abnormality. Adrenals/Urinary Tract: Stable nodular thickening of the adrenal glands without discrete concerning adrenal nodule. Kidneys are unremarkable, without renal calculi, suspicious lesion, or hydronephrosis. Bladder is slightly displaced rightward though this configuration is similar to comparison CT from 2014. No other bladder abnormality. Stomach/Bowel: Distal esophagus and stomach are unremarkable. There is a fluid-filled outpouching arising from the first portion of the duodenum with some irregular mural thickening and adjacent inflammation (2/34, 5/43). No small bowel dilatation or wall thickening. The appendix appears surgically absent. There is extensive pancolonic diverticulosis without evidence of acute peridiverticular inflammation. No colonic dilatation or wall thickening. Vascular/Lymphatic: Extensive atherosclerotic plaque of the aorta and branch vessels. Tortuosity of the abdominal aorta, a likely senescent change. No suspicious or enlarged lymph nodes in the included lymphatic chains. Reactive appearing upper abdominal lymph nodes are present. Reproductive: Uterus is surgically absent. No concerning adnexal lesions. Other: No abdominopelvic free fluid or free gas. No bowel containing hernias. Small fat containing umbilical hernia mild body wall edema. Significant pelvic floor laxity with likely cyst seal. Musculoskeletal: No acute osseous abnormality or suspicious osseous lesion. Multilevel degenerative changes are present in the imaged portions of the spine. There is dextrocurvature of the thoracolumbar spine centered at the thoracolumbar junction. Sclerotic changes of the SI joints predominantly ileal side may reflect sequela of prior osteitis condensans. IMPRESSION: 1. Fluid-filled outpouching arising from the first portion of the duodenum with some irregular  mural thickening and adjacent inflammation, concerning for contained perforation of a duodenal ulcer or inflamed duodenal diverticulum. 2. Extensive noninflamed pancolonic diverticulosis. 3. Pelvic floor laxity with likely cystocele. 4. Aortic Atherosclerosis (ICD10-I70.0). 5. Cardiomegaly and four-chamber cardiac enlargement. Coronary atherosclerosis. Electronically Signed   By: Lovena Le M.D.   On: 02/07/2019 23:23      IMPRESSION AND PLAN:   1.  Duodenal ulcer with possible perforation or inflamed duodenal diverticulum.  The patient will be admitted to a MedSurg bed.  She will be placed on hydration with IV normal saline.  Pain management will be provided.  We will follow blood cultures and check lactic acid level.  The patient will be kept n.p.o.  General surgery consultation will be obtained by Dr. Lysle Pearl.  I notified them regarding the patient.  We will  continue antibiotic therapy with IV Rocephin and Flagyl.  We will also place the patient on IV Protonix drip.  We will also obtain a GI consultation.  Dr. Allen Norris was notified about the patient  2.  UTI.  The patient will be on IV Rocephin and will check urine culture and sensitivity.  3.  Acute kidney injury.  The patient will be placed on hydration with IV normal saline and will follow BMP.  Nephrotoxins will be held off.  4.  Type 2 diabetes mellitus.  The patient will be placed on supplemental coverage with Humalog and will hold off Actos.  5.  Hypertension.  Blood pressure has been borderline.  All of these will be held off specially given acute kidney injury.  6.  Atrial fibrillation.  Ventricular rate has been controlled.  Will hold Eliquis for any potential surgical intervention.  7.  GERD.  The patient will be placed on IV PPI therapy.  8.  DVT prophylaxis.  SCDs.  Medical prophylaxis will be held off given the high risk of bleeding with such duodenal ulcer.   All the records are reviewed and case discussed with ED provider. The  plan of care was discussed in details with the patient (and family). I answered all questions. The patient agreed to proceed with the above mentioned plan. Further management will depend upon hospital course.   CODE STATUS: Full code  TOTAL TIME TAKING CARE OF THIS PATIENT: 55 minutes.    Christel Mormon M.D on 02/08/2019 at 8:17 AM  [facility]   From 7 PM-7 AM, contact night-coverage [email or web address]  CC: Primary care physician; Ria Bush, MD   Note: This dictation was prepared with Dragon dictation along with smaller phrase technology. Any transcriptional errors that result from this process are unintentional.

## 2019-02-08 NOTE — Progress Notes (Signed)
PROGRESS NOTE  [name]  DOB: [birth year]  PCP: Ria Bush, MD AOZ:[id number]  DOA: 02/07/2019  LOS: 0 days   Brief narrative: Patient is an 85 year old Caucasian female with PMH of HTN, HLD, DM2, CADs/pCABG, diastolic CHF, PAD, DVT, chronic venous insufficiency, diverticulosis,, hemorrhoids, cystocele, depression. She presented to the ED on 11/5 with complaint of acute abdominal pain extending from epigastric area to her mid abdomen and right upper quadrant with no nausea or vomiting or fever or chills.   In the ED, patient was afebrile, hemodynamically stable. Work-up showed WBC count elevated to 25.7. Urinalysis showed cloudy amber-colored urine with moderate amount of leukocytes, many bacteria and WBC clumps. CT abdomen pelvis showed fluid-filled outpouching arising from the first portion of the duodenum with some irregular mural thickening and adjacent inflammation, concerning for contained perforation of a duodenal ulcer or inflamed duodenal diverticulum. General surgery consultation was called from ED.  Duodenal diverticulitis was suspected to be the primary diagnosis. Patient was admitted to hospital service for further evaluation and management.   Subjective: Patient was seen and examined this afternoon in the ED.  Patient elderly Caucasian female.  Seems to have mild dementia.  Alert, awake, oriented x3 but slow to respond. Not in physical distress at this time.  Assessment/Plan: Duodenal diverticulitis  -Medical management with IV antibiotics was recommended.  Currently on IV Rocephin and IV Flagyl. -No surgical intervention required at this time. -GI consultation was called in as well.  Pending recommendation. -Anticoagulation remains on hold just in case surgical intervention is required.  UTI -Urinalysis positive for UTI.  Currently on IV Rocephin.  Pending urine culture report.  Acute kidney injury -Creatinine normal at baseline.  Elevated to 1.6 on  presentation yesterday.  Started on IV hydration.  Repeat creatinine tomorrow.  Type 2 diabetes mellitus -Patient takes Actos at home.  Currently on hold and sliding scale insulin with Accu-Cheks.  Obtain A1c.  Cardiovascular issues: HTN/ HLD /diastolic CHF/ CADs/p CABG/ PAD -Home meds include metoprolol, ramipril, Lasix, pravastatin and Eliquis. -It seems to me that patient has remained n.p.o. and missed her morning meds.  Patient seems to be getting tachycardic this afternoon.  I asked the nurse to give her metoprolol.  GI to see.  Defer to GI for diet advancement.  History of DVT in 2015 -On Eliquis.  Currently on hold.  Resume when okay with surgery and GI.  GERD -PPI.  Mobility: Encourage ambulation DVT prophylaxis:  SCDs.  Eliquis on hold. Code Status:   Code Status: Full Code  Family Communication:  Expected Discharge:  Pending clinical course  Consultants:  GI, surgery  Procedures:  None  Antimicrobials: Anti-infectives (From admission, onward)   Start     Dose/Rate Route Frequency Ordered Stop   02/08/19 0430  cefTRIAXone (ROCEPHIN) 2 g in sodium chloride 0.9 % 100 mL IVPB     2 g 200 mL/hr over 30 Minutes Intravenous Every 24 hours 02/08/19 0428     02/08/19 0430  metroNIDAZOLE (FLAGYL) IVPB 500 mg     500 mg 100 mL/hr over 60 Minutes Intravenous Every 8 hours 02/08/19 0428     02/07/19 2330  piperacillin-tazobactam (ZOSYN) IVPB 3.375 g  Status:  Discontinued     3.375 g 100 mL/hr over 30 Minutes Intravenous  Once 02/07/19 2328 02/07/19 2329   02/07/19 2330  piperacillin-tazobactam (ZOSYN) IVPB 3.375 g     3.375 g 12.5 mL/hr over 240 Minutes Intravenous  Once 02/07/19 2329 02/08/19 0317  Diet Order            Diet NPO time specified  Diet effective now              Infusions:  . sodium chloride 100 mL/hr at 02/08/19 0521  . cefTRIAXone (ROCEPHIN)  IV Stopped (02/08/19 0601)  . metronidazole 500 mg (02/08/19 1352)  . pantoprozole (PROTONIX)  infusion 8 mg/hr (02/08/19 1219)    Scheduled Meds: . collagenase   Topical Daily  . diclofenac sodium  1 application Topical TID  . insulin aspart  0-5 Units Subcutaneous QHS  . insulin aspart  0-9 Units Subcutaneous TID WC  . metoprolol tartrate  37.5 mg Oral BID  . multivitamin with minerals  1 tablet Oral Daily  . nystatin cream  1 application Topical BID  . [START ON 02/11/2019] pantoprazole  40 mg Intravenous Q12H  . pravastatin  20 mg Oral Daily  . traZODone  150 mg Oral QHS  . vitamin B-12  1,000 mcg Oral Daily  . vitamin E  400 Units Oral Daily    PRN meds: acetaminophen, ALPRAZolam, HYDROcodone-acetaminophen, loratadine, morphine injection, nitroGLYCERIN, ondansetron **OR** ondansetron (ZOFRAN) IV   Objective: Vitals:   02/08/19 1223 02/08/19 1227  BP: (!) 135/91   Pulse:  (!) 136  Resp: 16   Temp: 99.3 F (37.4 C)   SpO2: 94%     Intake/Output Summary (Last 24 hours) at 02/08/2019 1356 Last data filed at 02/08/2019 0317 Gross per 24 hour  Intake 50 ml  Output -  Net 50 ml   Filed Weights   02/07/19 2041  Weight: 65.3 kg   Weight change:  Body mass index is 31.15 kg/m.   Physical Exam: General exam: Appears calm and comfortable.  Skin: No rashes, lesions or ulcers. HEENT: Atraumatic, normocephalic, supple neck, no obvious bleeding Lungs: Clear to auscultation bilaterally CVS: Regular rate and rhythm, no murmur GI/Abd soft, mild to moderate epigastric tenderness, bowel sound present CNS: Alert, awake, oriented x3 Psychiatry: Mood appropriate Extremities: No pedal edema, no calf tenderness  Data Review: I have personally reviewed the laboratory data and studies available.  Recent Labs  Lab 02/04/19 1409 02/07/19 1639  WBC 8.8 25.7*  NEUTROABS 6.1  --   HGB 12.8 12.4  HCT 39.3 39.7  MCV 92.8 95.9  PLT 229.0 186   Recent Labs  Lab 02/04/19 1409 02/07/19 1639  NA 140 137  K 4.7 4.2  CL 102 100  CO2 31 26  GLUCOSE 94 100*  BUN 25* 31*   CREATININE 0.98 1.60*  CALCIUM 9.3 9.2    Terrilee Croak, MD  [facility] 02/08/2019

## 2019-02-08 NOTE — Consult Note (Signed)
Subjective:   CC: Abdominal pain  HPI:  [name] is a 85 y.o. female who was consulted by Owens Shark for issue above.  Symptoms were first noted 3 days ago. Pain is sharp nagging, located in the epigastric region and wrapping around towards mid back, slight radiation towards the umbilicus.  Associated with nothing specific, exacerbated by nothing specific.  Sudden onset, with no worsening of the pain but persistent.  Denies any nausea or vomiting, tolerating some oral intake.  No chest pain.  Slightly loose stools.  Past Medical History:  has a past medical history of Acute deep vein thrombosis (DVT) of distal vein of right lower extremity ([place]) (12/2013), Anxiety, Cellulitis of leg, left (01/16/2014), Chronic back pain (03/2012), Chronic venous insufficiency, Coronary artery disease, Cystocele, Depression, Diabetes mellitus, Diastolic dysfunction, Diverticulosis, Hemorrhoids, History of heart attack, echocardiogram, Hyperlipidemia, Hypertension, Irritable bowel syndrome, Microcytic anemia, PAD (peripheral artery disease) ([place]) (2013), Postmenopausal osteoporosis (01/2019), PUD (peptic ulcer disease), Varicose veins, Venous ulcer of leg ([place]) (12/12/2013), and Viral gastroenteritis due to [place] virus (05/13/2015).  Past Surgical History:  Past Surgical History:  Procedure Laterality Date  . ABDOMINAL HYSTERECTOMY  1975   TAH,BSO  . ABI  2013   R 0.91, L 0.83  . APPENDECTOMY     per pt report  . CARDIOVASCULAR STRESS TEST  2015   low risk myoview (Nahser)  . CATARACT SURGERY    . CHOLECYSTECTOMY    . COLONOSCOPY  02/2010   extensive diverticulosis throughout, small internal hemorrhoids, rec rpt 5 yrs (Dr. Allyn Kenner)  . CORONARY ARTERY BYPASS GRAFT  1998  . dexa scan  09/2011   femur -0.8, forearm 0.6 => normal  . ENDOVENOUS ABLATION SAPHENOUS VEIN W/ LASER Left 03/2014   Kellie Simmering  . ESI  2014   L S1 transforaminal ESI x2 (Chasnis) without improvement  . HAND SURGERY    . lexiscan myoview   03/2011   Small basal inferolateral and anterolateral reversible perfusion defect suggests ischemia.  EF was normal.   old infarct, no new ischemia  . LOWER EXTREMITY ANGIOGRAPHY Right 09/06/2018   Procedure: LOWER EXTREMITY ANGIOGRAPHY;  Surgeon: Algernon Huxley, MD;  Location: [facility];  Service: Cardiovascular;  Laterality: Right;  . toe surgery    . TYMPANOPLASTY  1960s   R side  . Vault prolapse A & P repair  2009   Dr Rhodia Albright [facility] hospital    Family History: family history includes Diabetes in her brother, father, and sister; Heart disease in her son; Hypertension in her mother; Stroke in her mother.  Social History:  reports that she quit smoking about 39 years ago. She has never used smokeless tobacco. She reports that she does not drink alcohol or use drugs.  Current Medications:  acetaminophen (TYLENOL) 500 MG tablet Take 500 mg by mouth every 6 (six) hours as needed for mild pain. [provider] Needs Review  ALPRAZolam (XANAX) 1 MG tablet Take 0.5 mg by mouth 4 (four) times daily as needed for anxiety.  [provider] Needs Review  apixaban (ELIQUIS) 5 MG TABS tablet Take 1 tablet (5 mg total) by mouth 2 (two) times daily. Nahser, Wonda Cheng, MD Needs Review  collagenase (SANTYL) ointment Apply topically daily. Bettey Costa, MD Needs Review  diclofenac sodium (VOLTAREN) 1 % GEL Apply 1 application topically 3 (three) times daily. Ria Bush, MD Needs Review  furosemide (LASIX) 20 MG tablet Take 0.5 tablets (10 mg total) by mouth daily. Ria Bush, MD  Needs Review  glucosamine-chondroitin 500-400 MG tablet Take 1 tablet by mouth daily.  [provider] Needs Review  glucose blood (ONETOUCH VERIO) test strip USE TO TEST BLOOD SUGAR ONCE DAILY. DX:E11.9 Elayne Snare, MD Needs Review  HYDROcodone-acetaminophen (NORCO/VICODIN) 5-325 MG tablet Take 0.5 tablets by mouth every 6 (six) hours as needed for moderate pain. Ria Bush, MD  Needs Review  loperamide (IMODIUM) 2 MG capsule Take 2 mg by mouth 4 (four) times daily as needed for diarrhea or loose stools. [provider] Needs Review  loratadine (CLARITIN) 10 MG tablet Take 10 mg by mouth daily as needed for allergies. [provider] Needs Review  metoprolol tartrate (LOPRESSOR) 25 MG tablet Take 1.5 tablets (37.5 mg total) by mouth 2 (two) times daily. [name] T, PA-C Needs Review  Multiple Vitamin (MULTIVITAMIN) tablet Take 1 tablet by mouth daily. Patient reports takes a new vitamin to replace her centrum as recommended by her eye doctor. [provider] Needs Review  nitroGLYCERIN (NITROSTAT) 0.4 MG SL tablet Place 1 tablet (0.4 mg total) under the tongue every 5 (five) minutes as needed for chest pain. Nahser, Wonda Cheng, MD Needs Review  nystatin cream (MYCOSTATIN) Apply 1 application topically 2 (two) times daily. Ria Bush, MD Needs Review  nystatin-triamcinolone ointment (MYCOLOG) APPLY TO AFFECTED AREA TWICE A DAY AS NEEDED FOR ITCHING Fontaine, Belinda Block, MD Needs Review  pantoprazole (PROTONIX) 40 MG tablet TAKE 1 TABLET BY MOUTH EVERY DAY Ria Bush, MD Needs Review  pioglitazone (ACTOS) 30 MG tablet TAKE 1 TABLET BY MOUTH EVERY DAY Elayne Snare, MD Needs Review  polyethylene glycol powder (GLYCOLAX/MIRALAX) powder Take 17 g by mouth daily. Hold for diarrhea Ria Bush, MD Needs Review  pravastatin (PRAVACHOL) 20 MG tablet TAKE 1 TABLET BY MOUTH EVERY DAY Ria Bush, MD Needs Review  Probiotic Product (ALIGN) 4 MG CAPS Take 1 capsule (4 mg total) by mouth daily. Ria Bush, MD Needs Review  ramipril (ALTACE) 5 MG capsule TAKE ONE CAPSULE BY MOUTH EVERYDAY FOR BLOOD PRESSURE Ria Bush, MD Needs Review  traZODone (DESYREL) 150 MG tablet Take 150 mg by mouth at bedtime.  [provider] Needs Review  triamcinolone cream (KENALOG) 0.1 % APPLY TO AFFECTED AREA TWICE A Velora Heckler, MD Needs Review  vitamin B-12 (CYANOCOBALAMIN) 1000 MCG tablet Take 1,000 mcg by mouth daily. [provider] Needs Review  vitamin E 400 UNIT capsule Take 400 Units by mouth daily. [provider] Needs Review     Allergies:  Allergies as of 02/07/2019 - Review Complete 02/07/2019  Allergen Reaction Noted  . Metformin and related Diarrhea 05/14/2012    ROS:  General: Denies weight loss, weight gain, fatigue, fevers, chills, and night sweats. Eyes: Denies blurry vision, double vision, eye pain, itchy eyes, and tearing. Ears: Denies hearing loss, earache, and ringing in ears. Nose: Denies sinus pain, congestion, infections, runny nose, and nosebleeds. Mouth/throat: Denies hoarseness, sore throat, bleeding gums, and difficulty swallowing. Heart: Denies chest pain, palpitations, racing heart, irregular heartbeat, leg pain or swelling, and decreased activity tolerance. Respiratory: Denies breathing difficulty, shortness of breath, wheezing, cough, and sputum. GI: Denies change in appetite, heartburn, nausea, vomiting, constipation, diarrhea, and blood in stool. GU: Denies difficulty urinating, pain with urinating, urgency, frequency, blood in urine. Musculoskeletal: Denies joint stiffness, pain, swelling, muscle weakness. Skin: Denies rash, itching, mass, tumors, sores, and boils Neurologic: Denies headache, fainting, dizziness, seizures, numbness, and tingling. Psychiatric: Denies depression, anxiety, difficulty sleeping, and memory loss. Endocrine:  Denies heat or cold intolerance, and increased thirst or urination. Blood/lymph: Denies easy bruising, easy bruising, and swollen glands     Objective:     BP 100/64   Pulse 96   Temp 98.1 F (36.7 C) (Oral)   Resp 20   Ht 4\' 9"  (1.448 m)   Wt 65.3 kg   SpO2 98%   BMI 31.15 kg/m   Constitutional :  alert, cooperative, appears stated age and no distress  Lymphatics/Throat:  no asymmetry, masses, or scars   Respiratory:  clear to auscultation bilaterally  Cardiovascular:  irregularly irregular rhythm  Gastrointestinal: Soft, no guarding, tenderness to palpation in epigastric region as well as right upper quadrant and periumbilical.  No pain in the left upper or left lower quadrant.   Musculoskeletal: Steady movement, uses walker  Skin: Cool and moist.  No rashes or lesions  Psychiatric: Normal affect, non-agitated, not confused       LABS:  CMP Latest Ref Rng & Units 02/07/2019 02/04/2019 09/04/2018  Glucose 70 - 99 mg/dL 100(H) 94 86  BUN 8 - 23 mg/dL 31(H) 25(H) 20  Creatinine 0.44 - 1.00 mg/dL 1.60(H) 0.98 0.86  Sodium 135 - 145 mmol/L 137 140 138  Potassium 3.5 - 5.1 mmol/L 4.2 4.7 4.6  Chloride 98 - 111 mmol/L 100 102 101  CO2 22 - 32 mmol/L 26 31 33(H)  Calcium 8.9 - 10.3 mg/dL 9.2 9.3 9.2  Total Protein 6.5 - 8.1 g/dL 6.7 6.3 6.4  Total Bilirubin 0.3 - 1.2 mg/dL 0.8 0.5 0.4  Alkaline Phos 38 - 126 U/L 94 113 114  AST 15 - 41 U/L 26 17 20   ALT 0 - 44 U/L 18 14 15    CBC Latest Ref Rng & Units 02/07/2019 02/04/2019 06/21/2018  WBC 4.0 - 10.5 K/uL 25.7(H) 8.8 15.5(H)  Hemoglobin 12.0 - 15.0 g/dL 12.4 12.8 13.2  Hematocrit 36.0 - 46.0 % 39.7 39.3 40.9  Platelets 150 - 400 K/uL 186 229.0 233    RADS: CLINICAL DATA:  Acute generalized abdominal pain, fevers chills and right-sided abdominal/flank pain  EXAM: CT ABDOMEN AND PELVIS WITHOUT CONTRAST  TECHNIQUE: Multidetector CT imaging of the abdomen and pelvis was performed following the standard protocol without IV contrast.  COMPARISON:  CT abdomen pelvis 07/30/2012  FINDINGS: Lower chest: Streaky atelectatic changes are present in the lung bases. Moderate cardiomegaly with four-chamber enlargement. Mitral annular and aortic leaflet calcifications are present. Coronary artery calcifications are present. No pericardial effusion.  Hepatobiliary: No focal liver abnormality is seen. Patient is post cholecystectomy. Slight  prominence of the biliary tree likely related to reservoir effect. No calcified intraductal gallstones.  Pancreas: Unremarkable. No pancreatic ductal dilatation or surrounding inflammatory changes.  Spleen: Normal in size without focal abnormality.  Adrenals/Urinary Tract: Stable nodular thickening of the adrenal glands without discrete concerning adrenal nodule. Kidneys are unremarkable, without renal calculi, suspicious lesion, or hydronephrosis. Bladder is slightly displaced rightward though this configuration is similar to comparison CT from 2014. No other bladder abnormality.  Stomach/Bowel: Distal esophagus and stomach are unremarkable. There is a fluid-filled outpouching arising from the first portion of the duodenum with some irregular mural thickening and adjacent inflammation (2/34, 5/43). No small bowel dilatation or wall thickening. The appendix appears surgically absent. There is extensive pancolonic diverticulosis without evidence of acute peridiverticular inflammation. No colonic dilatation or wall thickening.  Vascular/Lymphatic: Extensive atherosclerotic plaque of the aorta and branch vessels. Tortuosity of the abdominal aorta, a likely senescent change. No suspicious or enlarged  lymph nodes in the included lymphatic chains. Reactive appearing upper abdominal lymph nodes are present.  Reproductive: Uterus is surgically absent. No concerning adnexal lesions.  Other: No abdominopelvic free fluid or free gas. No bowel containing hernias. Small fat containing umbilical hernia mild body wall edema. Significant pelvic floor laxity with likely cyst seal.  Musculoskeletal: No acute osseous abnormality or suspicious osseous lesion. Multilevel degenerative changes are present in the imaged portions of the spine. There is dextrocurvature of the thoracolumbar spine centered at the thoracolumbar junction. 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  IMPRESSION: 1. Fluid-filled outpouching arising from the first portion of the duodenum with some irregular mural thickening and adjacent inflammation, concerning for contained perforation of a duodenal ulcer or inflamed duodenal diverticulum. 2. Extensive noninflamed pancolonic diverticulosis. 3. Pelvic floor laxity with likely cystocele. 4. Aortic Atherosclerosis (ICD10-I70.0). 5. Cardiomegaly and four-chamber cardiac enlargement. Coronary atherosclerosis.   Electronically Signed   By: Lovena Le M.D.   On: 02/07/2019 23:23 Assessment:   Nonspecific abdominal pain, with CT findings concerning for contained duodenal perforation versus duodenal diverticulitis.  Images were personally reviewed by myself.  History of A. fib, on anticoagulation, hypertension and diabetes  Plan:   Patient vitals are currently stable, reported history of over 3 days of pain with overall nonworsening, and patient has continued to tolerate oral intake and have regular bowel movements although slightly loose per report.  She has minimal tenderness on exam without any pain medications.  Duodenal diverticular like structure is noted on a previous CT scan back in 2014 in the area of concern, and there is definitely no free air associated with this on today's CT.  Therefore, I do not believe she requires any immediate surgical intervention.  Recommend IV antibiotics for possible diverticulitis of the duodenum.  Although on clinical exam she is stable, her leukocytosis is concerning enough to warrant an admission.  Urinalysis seems to show a dirty catch, may need to consider a repeat to ensure no additional causes of leukocytosis.  Recommend patient be admitted under the hospitalist care for medical management regarding her chronic conditions as well as continued care for her present issue.  Please hold the Eliquis in case she needs to proceed with some sort of  surgical intervention.  Okay to start with clear liquid diet for now.  Patient is also very concerned about her home situation, where she takes care of of many pets as well as several friends of similar age.  She does not have a strong family support system in the area.  Recommend social work involvement to see if any resources that are available to aid in her obligations outside of the hospital.  Surgery will continue to follow while in-house.

## 2019-02-08 NOTE — Progress Notes (Signed)
Pharmacy Antibiotic Note  [name] is a 85 y.o. female admitted on 02/07/2019 with bacteremia.  Pharmacy has been consulted for Meropenem dosing.  Plan: BCID results were discussed with Dr. Pietro Cassis and the patient's antibiotics will be changed to Meropenem 500 mg IV Q12H. Marland Kitchen Further narrowing will be determined based on finalized susceptibilities.   Will d/c metronidazole and ceftriaxone.   Height: 4\' 9"  (144.8 cm) Weight: 143 lb 15.4 oz (65.3 kg) IBW/kg (Calculated) : 38.6  Temp (24hrs), Avg:98.6 F (37 C), Min:98.1 F (36.7 C), Max:99.3 F (37.4 C)  Recent Labs  Lab 02/04/19 1409 02/07/19 1639  WBC 8.8 25.7*  CREATININE 0.98 1.60*    Estimated Creatinine Clearance: 18.6 mL/min (A) (by C-G formula based on SCr of 1.6 mg/dL (H)).    Allergies  Allergen Reactions  . Metformin And Related Diarrhea    Antimicrobials this admission:   >>    >>   Dose adjustments this admission:   Microbiology results:  BCx:  EColi in 3 of 4 bottles   UCx:    Sputum:    MRSA PCR:   Thank you for allowing pharmacy to be a part of this patient's care.  Akasia Ahmad D 02/08/2019 3:26 PM

## 2019-02-09 DIAGNOSIS — R7881 Bacteremia: Secondary | ICD-10-CM

## 2019-02-09 LAB — CBC WITH DIFFERENTIAL/PLATELET
Abs Immature Granulocytes: 0.06 10*3/uL (ref 0.00–0.07)
Basophils Absolute: 0.1 10*3/uL (ref 0.0–0.1)
Basophils Relative: 0 %
Eosinophils Absolute: 0.1 10*3/uL (ref 0.0–0.5)
Eosinophils Relative: 1 %
HCT: 38.9 % (ref 36.0–46.0)
Hemoglobin: 12.1 g/dL (ref 12.0–15.0)
Immature Granulocytes: 1 %
Lymphocytes Relative: 6 %
Lymphs Abs: 0.8 10*3/uL (ref 0.7–4.0)
MCH: 30 pg (ref 26.0–34.0)
MCHC: 31.1 g/dL (ref 30.0–36.0)
MCV: 96.5 fL (ref 80.0–100.0)
Monocytes Absolute: 1 10*3/uL (ref 0.1–1.0)
Monocytes Relative: 8 %
Neutro Abs: 10 10*3/uL — ABNORMAL HIGH (ref 1.7–7.7)
Neutrophils Relative %: 84 %
Platelets: 171 10*3/uL (ref 150–400)
RBC: 4.03 MIL/uL (ref 3.87–5.11)
RDW: 14.5 % (ref 11.5–15.5)
WBC: 11.9 10*3/uL — ABNORMAL HIGH (ref 4.0–10.5)
nRBC: 0 % (ref 0.0–0.2)

## 2019-02-09 LAB — BASIC METABOLIC PANEL
Anion gap: 8 (ref 5–15)
BUN: 23 mg/dL (ref 8–23)
CO2: 24 mmol/L (ref 22–32)
Calcium: 8.3 mg/dL — ABNORMAL LOW (ref 8.9–10.3)
Chloride: 109 mmol/L (ref 98–111)
Creatinine, Ser: 0.77 mg/dL (ref 0.44–1.00)
GFR calc Af Amer: >60 mL/min (ref >60)
GFR calc non Af Amer: >60 mL/min (ref >60)
Glucose, Bld: 144 mg/dL — ABNORMAL HIGH (ref 70–99)
Potassium: 3.9 mmol/L (ref 3.5–5.1)
Sodium: 141 mmol/L (ref 135–145)

## 2019-02-09 LAB — GLUCOSE, CAPILLARY
Glucose-Capillary: 133 mg/dL — ABNORMAL HIGH (ref 70–99)
Glucose-Capillary: 143 mg/dL — ABNORMAL HIGH (ref 70–99)
Glucose-Capillary: 144 mg/dL — ABNORMAL HIGH (ref 70–99)
Glucose-Capillary: 199 mg/dL — ABNORMAL HIGH (ref 70–99)
Glucose-Capillary: 216 mg/dL — ABNORMAL HIGH (ref 70–99)

## 2019-02-09 LAB — MRSA PCR SCREENING: MRSA by PCR: NEGATIVE

## 2019-02-09 MED ORDER — DILTIAZEM HCL-DEXTROSE 125-5 MG/125ML-% IV SOLN (PREMIX)
5.0000 mg/h | INTRAVENOUS | Status: DC
  Filled 2019-02-09: qty 125

## 2019-02-09 MED ORDER — SODIUM CHLORIDE 0.9 % IV SOLN
INTRAVENOUS | Status: DC | PRN
  Administered 2019-02-09 – 2019-02-13 (×6): 250 mL via INTRAVENOUS

## 2019-02-09 MED ORDER — METOPROLOL TARTRATE 5 MG/5ML IV SOLN
5.0000 mg | Freq: Four times a day (QID) | INTRAVENOUS | Status: DC | PRN
  Administered 2019-02-09 – 2019-02-15 (×3): 5 mg via INTRAVENOUS
  Filled 2019-02-09 (×4): qty 5

## 2019-02-09 MED ORDER — DILTIAZEM HCL 100 MG IV SOLR
5.0000 mg/h | INTRAVENOUS | Status: DC
  Administered 2019-02-09: 5 mg/h via INTRAVENOUS
  Administered 2019-02-09 – 2019-02-11 (×6): 10 mg/h via INTRAVENOUS
  Filled 2019-02-09 (×3): qty 100

## 2019-02-09 MED ORDER — METOPROLOL TARTRATE 5 MG/5ML IV SOLN
5.0000 mg | Freq: Once | INTRAVENOUS | Status: AC
  Administered 2019-02-09: 5 mg via INTRAVENOUS
  Filled 2019-02-09: qty 5

## 2019-02-09 MED ORDER — DILTIAZEM HCL-DEXTROSE 100-5 MG/100ML-% IV SOLN (PREMIX)
5.0000 mg/h | INTRAVENOUS | Status: DC
  Filled 2019-02-09: qty 100

## 2019-02-09 MED ORDER — SODIUM CHLORIDE 0.9 % IV SOLN
1.0000 g | Freq: Two times a day (BID) | INTRAVENOUS | Status: DC
  Administered 2019-02-09: 1 g via INTRAVENOUS
  Filled 2019-02-09 (×4): qty 1

## 2019-02-09 MED ORDER — METOPROLOL TARTRATE 50 MG PO TABS
50.0000 mg | ORAL_TABLET | Freq: Two times a day (BID) | ORAL | Status: DC
  Administered 2019-02-09 – 2019-02-12 (×7): 50 mg via ORAL
  Filled 2019-02-09 (×7): qty 1

## 2019-02-09 NOTE — Progress Notes (Signed)
Pharmacy Antibiotic Note  [name] is a 85 y.o. female admitted on 02/07/2019 with bacteremia.  Pharmacy has been consulted for Meropenem dosing.  BCID results were discussed with Dr. Pietro Cassis and the patient's antibiotics changed to Meropenem.    Plan: CrCL/Scr have improved. Will adjust dose to meropenem 1g IV every 12 hours   Will continue to monitor Cx/S for possible narrowing.  Height: 4\' 9"  (144.8 cm) Weight: 144 lb 8 oz (65.5 kg) IBW/kg (Calculated) : 38.6  Temp (24hrs), Avg:98.6 F (37 C), Min:98.3 F (36.8 C), Max:99 F (37.2 C)  Recent Labs  Lab 02/04/19 1409 02/07/19 1639 02/09/19 0529  WBC 8.8 25.7* 11.9*  CREATININE 0.98 1.60* 0.77    Estimated Creatinine Clearance: 37.2 mL/min (by C-G formula based on SCr of 0.77 mg/dL).    Allergies  Allergen Reactions  . Metformin And Related Diarrhea    Antimicrobials this admission:  11/6 CTX/Flagyl >> 11/6 11/6 Zosyn x 1  Microbiology results:  BCx:  EColi in 3 of 4 bottles   Thank you for allowing pharmacy to be a part of this patient's care.  Pernell Dupre, PharmD, BCPS Clinical Pharmacist 02/09/2019 2:51 PM

## 2019-02-09 NOTE — Plan of Care (Signed)
  Problem: Education: Goal: Knowledge of General Education information will improve Description: Including pain rating scale, medication(s)/side effects and non-pharmacologic comfort measures Outcome: Progressing   Problem: Nutrition: Goal: Adequate nutrition will be maintained Outcome: Progressing   Problem: Clinical Measurements: Goal: Cardiovascular complication will be avoided Outcome: Not Progressing Note: Patient still in afib, on cardizem gtt.

## 2019-02-09 NOTE — Plan of Care (Signed)
  Problem: Education: Goal: Knowledge of General Education information will improve Description: Including pain rating scale, medication(s)/side effects and non-pharmacologic comfort measures Outcome: Progressing   Problem: Nutrition: Goal: Adequate nutrition will be maintained Outcome: Progressing   

## 2019-02-09 NOTE — Progress Notes (Addendum)
Patient transferred from [facility]. Afib RVR with heart rate in 120s 130s. Cardizem gtt started at 61ml/hr. No complaints of pain. Patient stating she does not have her left hearing aid in. Per nurse that brought patient over from [facility], she lost it over there couple of minutes before transporting her here. Will look for it on the bed and 2C will continue to look for it. Patient also had rolling walker from home, 2C called so they can check.   Update 1900: Patient's walker from home in the room. Hearing aid still missing.

## 2019-02-09 NOTE — Progress Notes (Signed)
Called Rufina Falco regarding patient's irregular heart rate. Orders were placed for cardiac monitoring and one time dose of metoprolol.  Will continue to monitor.  Phoebe Sharps N  02/09/2019  1:52 AM

## 2019-02-09 NOTE — Progress Notes (Signed)
Patient heart rate is 120-150. Patient given IV metoprolol. Transfer to 2A as soon as a bed opens up.

## 2019-02-09 NOTE — Progress Notes (Signed)
Physical Therapy Evaluation Patient Details Name: [name] MRN: [id number] DOB: 08/24/1927 Today's Date: 02/09/2019   History of Present Illness  [name]  is [name] 85 y.o. Caucasian female with [name] known history of multiple medical problems that will be mentioned below, including type 2 diabetes mellitus, hypertension, diverticulosis and depression who presented to the emergency room with acute onset of abdominal pain described as sharp and extending from epigastric area to her mid abdomen and right upper quadrant with no nausea or vomiting or fever or chills.  Clinical Impression  Patient is received in the recliner for PT eval. She reports living with [name] friend and has 7 steps to get into her home with hand grips placed instead of railings for support. She has 3/5 strength BLE and needs mod assist for sit to stand transfer with RW. She is able to stand with RW for several minutes. Patient began to get agitated when nurse came into room due to patients HR increasing to 142 bpm and recommending that patient sit back down until she could get [name] medicine to help decrease her HR. Patient became agitated saying loudly that she wants to stand up and "get out of this chair". Patient was left in room with call bell and recliner feet elevated. She will benefit from skilled PT to improve mobility and strength.     Follow Up Recommendations Home health PT    Equipment Recommendations  None recommended by PT    Recommendations for Other Services       Precautions / Restrictions Precautions Precautions: Fall Restrictions Weight Bearing Restrictions: No      Mobility  Bed Mobility Overal bed mobility: (NT)                Transfers Overall transfer level: Needs assistance Equipment used: Rolling walker (2 wheeled)                Ambulation/Gait Ambulation/Gait assistance: (HR 142 )              Stairs            Wheelchair Mobility    Modified Rankin (Stroke  Patients Only)       Balance Overall balance assessment: Modified Independent                                           Pertinent Vitals/Pain      Home Living Family/patient expects to be discharged to:: Private residence Living Arrangements: Non-relatives/Friends Available Help at Discharge: Friend(s) Type of Home: House Home Access: Stairs to enter Entrance Stairs-Rails: Right Entrance Stairs-Number of Steps: 7 Home Layout: One level Home Equipment: Environmental consultant - 2 wheels      Prior Function Level of Independence: Independent with assistive device(s)               Hand Dominance        Extremity/Trunk Assessment   Upper Extremity Assessment Upper Extremity Assessment: Overall WFL for tasks assessed    Lower Extremity Assessment Lower Extremity Assessment: Overall WFL for tasks assessed       Communication   Communication: No difficulties  Cognition Arousal/Alertness: Awake/alert Behavior During Therapy: Restless;Anxious Overall Cognitive Status: Within Functional Limits for tasks assessed  General Comments      Exercises     Assessment/Plan    PT Assessment Patient needs continued PT services  PT Problem List Decreased strength;Decreased activity tolerance       PT Treatment Interventions Gait training;Therapeutic activities;Therapeutic exercise    PT Goals (Current goals can be found in the Care Plan section)  Acute Rehab PT Goals Patient Stated Goal: to walk PT Goal Formulation: Patient unable to participate in goal setting Time For Goal Achievement: 02/23/19 Potential to Achieve Goals: Good    Frequency Min 2X/week   Barriers to discharge Inaccessible home environment (7 steps)    Co-evaluation               AM-PAC PT "6 Clicks" Mobility  Outcome Measure Help needed turning from your back to your side while in [name] flat bed without using bedrails?: [name]  [name] Help needed moving from lying on your back to sitting on the side of [name] flat bed without using bedrails?: [name] Help needed moving to and from [name] bed to [name] chair (including [name] wheelchair)?: [name] Help needed standing up from [name] chair using your arms (e.g., wheelchair or bedside chair)?: [name] Help needed to walk in hospital room?: [name] Help needed climbing 3-5 steps with [name] railing? : [name] Lot 6 Click Score: 17    End of Session Equipment Utilized During Treatment: Gait belt;Oxygen Activity Tolerance: Treatment limited secondary to medical complications (Comment) Patient left: in chair;with chair alarm set Nurse Communication: Mobility status PT Visit Diagnosis: Muscle weakness (generalized) (M62.81);Difficulty in walking, not elsewhere classified (R26.2)    Time: 1400-1415 PT Time Calculation (min) (ACUTE ONLY): 15 min   Charges:   PT Evaluation $PT Eval Low Complexity: 1 Low            [place], Sherryl Barters, PT DPT 02/09/2019, 2:45 PM

## 2019-02-09 NOTE — Progress Notes (Signed)
Patient transferred to 2A because her heart rate would not come back to normal limits. Orders to start on a cardizem drip.

## 2019-02-09 NOTE — Progress Notes (Addendum)
PROGRESS NOTE  [name]  DOB: Jan 09, 1928  PCP: Ria Bush, MD YCX:[id number]  DOA: 02/07/2019  LOS: 1 day   Brief narrative: Patient is an 85 year old Caucasian female with PMH of HTN, HLD, DM2, CADs/pCABG, diastolic CHF, PAD, DVT, chronic venous insufficiency, diverticulosis,, hemorrhoids, cystocele, depression. She presented to the ED on 11/5 with complaint of acute abdominal pain extending from epigastric area to her mid abdomen and right upper quadrant with no nausea or vomiting or fever or chills.   In the ED, patient was afebrile, hemodynamically stable. Work-up showed WBC count elevated to 25.7. Urinalysis showed cloudy amber-colored urine with moderate amount of leukocytes, many bacteria and WBC clumps. CT abdomen pelvis showed fluid-filled outpouching arising from the first portion of the duodenum with some irregular mural thickening and adjacent inflammation, concerning for contained perforation of a duodenal ulcer or inflamed duodenal diverticulum. General surgery consultation was called from ED.  Duodenal diverticulitis was suspected to be the primary diagnosis. Patient was admitted to hospital service for further evaluation and management.  Blood culture obtained on admission is showing growth of gram-negative rods.  Subjective: Patient was seen and examined this morning.  Lying down in bed.  Uncomfortable because of her back.  When I told her about blood infection, she got worried because apparently her son died of a blood cancer.  I told her that they are not related.   Assessment/Plan: Gram-negative bacteremia -Initially presented with abdominal pain. -Blood culture obtained on admission is growing gram-negative rod. -Per pharmacy protocol, patient is on IV meropenem which we will continue. -Wait for final blood culture report.  Duodenal diverticulitis  -Medical management with IV antibiotics was recommended.  Currently on IV Rocephin and IV Flagyl. -No  surgical intervention required at this time. -GI consultation appreciated.  UTI -Urinalysis positive for UTI.  Currently on IV meropenem.  Pending urine culture report.  Acute kidney injury -Creatinine normal at baseline.  Elevated to 1.6 on presentation on admission.  Started on IV hydration.  Creatinine back to normal today.  Type 2 diabetes mellitus -hemoglobin A1c 6.9. -Patient takes Actos at home.  Currently on hold and sliding scale insulin with Accu-Cheks.    Cardiovascular issues: HTN/ HLD /diastolic CHF/ CADs/p CABG/ PAD -Home meds include metoprolol, ramipril, Lasix, pravastatin and Eliquis. -Metoprolol resumed and dose increased from 37.5 mg twice daily to 50 mg twice daily to control heart rate.   -I would keep ramipril and Lasix on hold for now. -Statin to continue.  History of DVT in 2015 -On Eliquis.  Currently on hold.  Resume when okay with surgery and GI.  GERD -PPI.  Mobility: Encourage ambulation.  PT eval ordered. DVT prophylaxis:  SCDs.  Eliquis on hold. Code Status:   Code Status: Full Code  Family Communication:  Expected Discharge:  Pending clinical course  Consultants:  GI, surgery  Procedures:  None  Antimicrobials: Anti-infectives (From admission, onward)   Start     Dose/Rate Route Frequency Ordered Stop   02/08/19 1600  meropenem (MERREM) 500 mg in sodium chloride 0.9 % 100 mL IVPB     500 mg 200 mL/hr over 30 Minutes Intravenous Every 12 hours 02/08/19 1515     02/08/19 0430  cefTRIAXone (ROCEPHIN) 2 g in sodium chloride 0.9 % 100 mL IVPB  Status:  Discontinued     2 g 200 mL/hr over 30 Minutes Intravenous Every 24 hours 02/08/19 0428 02/08/19 1502   02/08/19 0430  metroNIDAZOLE (FLAGYL) IVPB 500 mg  Status:  Discontinued     500 mg 100 mL/hr over 60 Minutes Intravenous Every 8 hours 02/08/19 0428 02/08/19 1502   02/07/19 2330  piperacillin-tazobactam (ZOSYN) IVPB 3.375 g  Status:  Discontinued     3.375 g 100 mL/hr over 30 Minutes  Intravenous  Once 02/07/19 2328 02/07/19 2329   02/07/19 2330  piperacillin-tazobactam (ZOSYN) IVPB 3.375 g     3.375 g 12.5 mL/hr over 240 Minutes Intravenous  Once 02/07/19 2329 02/08/19 0317      Diet Order            Diet Heart Room service appropriate? Yes; Fluid consistency: Thin  Diet effective now              Infusions:  . sodium chloride Stopped (02/09/19 0636)  . meropenem (MERREM) IV Stopped (02/09/19 0867)  . pantoprozole (PROTONIX) infusion Stopped (02/09/19 6195)    Scheduled Meds: . apixaban  5 mg Oral BID  . collagenase   Topical Daily  . diclofenac sodium  1 application Topical TID  . insulin aspart  0-5 Units Subcutaneous QHS  . insulin aspart  0-9 Units Subcutaneous TID WC  . metoprolol tartrate  50 mg Oral BID  . multivitamin with minerals  1 tablet Oral Daily  . nystatin cream  1 application Topical BID  . [START ON 02/11/2019] pantoprazole  40 mg Intravenous Q12H  . pravastatin  20 mg Oral Daily  . traZODone  150 mg Oral QHS  . vitamin B-12  1,000 mcg Oral Daily  . vitamin E  400 Units Oral Daily    PRN meds: acetaminophen, ALPRAZolam, HYDROcodone-acetaminophen, loratadine, morphine injection, nitroGLYCERIN, ondansetron **OR** ondansetron (ZOFRAN) IV   Objective: Vitals:   02/09/19 0900 02/09/19 1127  BP:  138/82  Pulse: 98 (!) 105  Resp: 19 (!) 28  Temp:  98.4 F (36.9 C)  SpO2:  98%    Intake/Output Summary (Last 24 hours) at 02/09/2019 1250 Last data filed at 02/09/2019 0640 Gross per 24 hour  Intake 2167.45 ml  Output 0 ml  Net 2167.45 ml   Filed Weights   02/07/19 2041 02/08/19 2347  Weight: 65.3 kg 65.5 kg   Weight change: 0.245 kg Body mass index is 31.27 kg/m.   Physical Exam: General exam: Appears calm and comfortable.  Skin: No rashes, lesions or ulcers. HEENT: Atraumatic, normocephalic, supple neck, no obvious bleeding Lungs: Clear to auscultation bilaterally CVS: Regular rate and rhythm, no murmur GI/Abd soft,  mild to moderate epigastric tenderness, bowel sound present CNS: Alert, awake, oriented x3 Psychiatry: Mood appropriate Extremities: No pedal edema, no calf tenderness  Data Review: I have personally reviewed the laboratory data and studies available.  Recent Labs  Lab 02/04/19 1409 02/07/19 1639 02/09/19 0529  WBC 8.8 25.7* 11.9*  NEUTROABS 6.1  --  10.0*  HGB 12.8 12.4 12.1  HCT 39.3 39.7 38.9  MCV 92.8 95.9 96.5  PLT 229.0 186 171   Recent Labs  Lab 02/04/19 1409 02/07/19 1639 02/09/19 0529  NA 140 137 141  K 4.7 4.2 3.9  CL 102 100 109  CO2 31 26 24   GLUCOSE 94 100* 144*  BUN 25* 31* 23  CREATININE 0.98 1.60* 0.77  CALCIUM 9.3 9.2 8.3*    Terrilee Croak, MD  [facility] 02/09/2019

## 2019-02-09 NOTE — Progress Notes (Signed)
Subjective:  CC: [name] is a 85 y.o. female  Hospital stay day 1,   duodenal diverticulitis  HPI: No issues since last exam.  Epigastric and suprapubic pain improving per patient report.    ROS:  General: Denies weight loss, weight gain, fatigue, fevers, chills, and night sweats. Heart: Denies chest pain, palpitations, racing heart, irregular heartbeat, leg pain or swelling, and decreased activity tolerance. Respiratory: Denies breathing difficulty, shortness of breath, wheezing, cough, and sputum. GI: Denies change in appetite, heartburn, nausea, vomiting, constipation, diarrhea, and blood in stool. GU: Denies difficulty urinating, pain with urinating, urgency, frequency, blood in urine.   Objective:   Temp:  [98.3 F (36.8 C)-99.3 F (37.4 C)] 98.3 F (36.8 C) (11/07 0455) Pulse Rate:  [103-136] 123 (11/07 0455) Resp:  [16-28] 24 (11/07 0455) BP: (112-144)/(74-104) 138/80 (11/07 0455) SpO2:  [88 %-100 %] 100 % (11/07 0455) FiO2 (%):  [97 %] 97 % (11/06 2001) Weight:  [65.5 kg] 65.5 kg (11/06 2347)     Height: 4\' 9"  (144.8 cm) Weight: 65.5 kg BMI (Calculated): 31.26   Intake/Output this shift:   Intake/Output Summary (Last 24 hours) at 02/09/2019 0647 Last data filed at 02/09/2019 0640 Gross per 24 hour  Intake 2167.45 ml  Output -  Net 2167.45 ml    Constitutional :  alert, cooperative and no distress  Respiratory:  clear to auscultation bilaterally  Cardiovascular:  irregularly irregular rhythm  Gastrointestinal: soft, no guarding, improving focal tendernss in epigastric/RUQ region, suprapubic area.   Skin: Cool and moist.   Psychiatric: Normal affect, non-agitated, not confused       LABS:  CMP Latest Ref Rng & Units 02/09/2019 02/07/2019 02/04/2019  Glucose 70 - 99 mg/dL 144(H) 100(H) 94  BUN 8 - 23 mg/dL 23 31(H) 25(H)  Creatinine 0.44 - 1.00 mg/dL 0.77 1.60(H) 0.98  Sodium 135 - 145 mmol/L 141 137 140  Potassium 3.5 - 5.1 mmol/L 3.9 4.2 4.7  Chloride  98 - 111 mmol/L 109 100 102  CO2 22 - 32 mmol/L 24 26 31   Calcium 8.9 - 10.3 mg/dL 8.3(L) 9.2 9.3  Total Protein 6.5 - 8.1 g/dL - 6.7 6.3  Total Bilirubin 0.3 - 1.2 mg/dL - 0.8 0.5  Alkaline Phos 38 - 126 U/L - 94 113  AST 15 - 41 U/L - 26 17  ALT 0 - 44 U/L - 18 14   CBC Latest Ref Rng & Units 02/09/2019 02/07/2019 02/04/2019  WBC 4.0 - 10.5 K/uL 11.9(H) 25.7(H) 8.8  Hemoglobin 12.0 - 15.0 g/dL 12.1 12.4 12.8  Hematocrit 36.0 - 46.0 % 38.9 39.7 39.3  Platelets 150 - 400 K/uL 171 186 229.0    RADS: n/a Assessment:   Duodenal diverticulitis.  Improving.  Ok to continue diet and once tolerating and wbc normalizes, ok to d/c from surgery standpoint.  Further care for UTI per primary team.  Surgery will peripherally follow.  Please call with any questions

## 2019-02-10 ENCOUNTER — Inpatient Hospital Stay (HOSPITAL_COMMUNITY)
Admission: EM | Admit: 2019-02-10 | Discharge: 2019-02-10 | Disposition: A | Discharge status: Home / Self Care | Payer: Medicare Other | Source: Home / Self Care | Attending: Internal Medicine | Admitting: Internal Medicine

## 2019-02-10 DIAGNOSIS — I340 Nonrheumatic mitral (valve) insufficiency: Secondary | ICD-10-CM

## 2019-02-10 DIAGNOSIS — R7881 Bacteremia: Secondary | ICD-10-CM

## 2019-02-10 DIAGNOSIS — I361 Nonrheumatic tricuspid (valve) insufficiency: Secondary | ICD-10-CM

## 2019-02-10 LAB — CULTURE, BLOOD (ROUTINE X 2)
Special Requests: ADEQUATE
Special Requests: ADEQUATE

## 2019-02-10 LAB — CBC WITH DIFFERENTIAL/PLATELET
Abs Immature Granulocytes: 0.06 10*3/uL (ref 0.00–0.07)
Basophils Absolute: 0 10*3/uL (ref 0.0–0.1)
Basophils Relative: 0 %
Eosinophils Absolute: 0 10*3/uL (ref 0.0–0.5)
Eosinophils Relative: 0 %
HCT: 36.5 % (ref 36.0–46.0)
Hemoglobin: 11.6 g/dL — ABNORMAL LOW (ref 12.0–15.0)
Immature Granulocytes: 1 %
Lymphocytes Relative: 6 %
Lymphs Abs: 0.7 10*3/uL (ref 0.7–4.0)
MCH: 29.7 pg (ref 26.0–34.0)
MCHC: 31.8 g/dL (ref 30.0–36.0)
MCV: 93.4 fL (ref 80.0–100.0)
Monocytes Absolute: 0.9 10*3/uL (ref 0.1–1.0)
Monocytes Relative: 8 %
Neutro Abs: 9.8 10*3/uL — ABNORMAL HIGH (ref 1.7–7.7)
Neutrophils Relative %: 85 %
Platelets: 154 10*3/uL (ref 150–400)
RBC: 3.91 MIL/uL (ref 3.87–5.11)
RDW: 14.2 % (ref 11.5–15.5)
WBC: 11.4 10*3/uL — ABNORMAL HIGH (ref 4.0–10.5)
nRBC: 0 % (ref 0.0–0.2)

## 2019-02-10 LAB — BASIC METABOLIC PANEL
Anion gap: 6 (ref 5–15)
BUN: 19 mg/dL (ref 8–23)
CO2: 23 mmol/L (ref 22–32)
Calcium: 8.2 mg/dL — ABNORMAL LOW (ref 8.9–10.3)
Chloride: 109 mmol/L (ref 98–111)
Creatinine, Ser: 0.67 mg/dL (ref 0.44–1.00)
GFR calc Af Amer: >60 mL/min (ref >60)
GFR calc non Af Amer: >60 mL/min (ref >60)
Glucose, Bld: 177 mg/dL — ABNORMAL HIGH (ref 70–99)
Potassium: 4.1 mmol/L (ref 3.5–5.1)
Sodium: 138 mmol/L (ref 135–145)

## 2019-02-10 LAB — URINE CULTURE: Culture: >=100000 — AB

## 2019-02-10 LAB — GLUCOSE, CAPILLARY
Glucose-Capillary: 147 mg/dL — ABNORMAL HIGH (ref 70–99)
Glucose-Capillary: 203 mg/dL — ABNORMAL HIGH (ref 70–99)
Glucose-Capillary: 205 mg/dL — ABNORMAL HIGH (ref 70–99)
Glucose-Capillary: 239 mg/dL — ABNORMAL HIGH (ref 70–99)

## 2019-02-10 LAB — ECHOCARDIOGRAM COMPLETE
Height: 57 in
Weight: 2438.4 oz

## 2019-02-10 MED ORDER — IPRATROPIUM-ALBUTEROL 0.5-2.5 (3) MG/3ML IN SOLN
3.0000 mL | Freq: Four times a day (QID) | RESPIRATORY_TRACT | Status: DC | PRN
  Administered 2019-02-10 – 2019-02-18 (×4): 3 mL via RESPIRATORY_TRACT
  Filled 2019-02-10 (×4): qty 3

## 2019-02-10 MED ORDER — TRAZODONE HCL 50 MG PO TABS
150.0000 mg | ORAL_TABLET | Freq: Every day | ORAL | Status: DC
  Administered 2019-02-12 – 2019-02-19 (×8): 150 mg via ORAL
  Filled 2019-02-10 (×10): qty 3

## 2019-02-10 MED ORDER — SODIUM CHLORIDE 0.9 % IV SOLN
2.0000 g | INTRAVENOUS | Status: DC
  Administered 2019-02-10 – 2019-02-11 (×2): 2 g via INTRAVENOUS
  Filled 2019-02-10 (×2): qty 2

## 2019-02-10 MED ORDER — CEFAZOLIN SODIUM-DEXTROSE 1-4 GM/50ML-% IV SOLN
1.0000 g | Freq: Three times a day (TID) | INTRAVENOUS | Status: DC
  Filled 2019-02-10 (×2): qty 50

## 2019-02-10 MED ORDER — FUROSEMIDE 20 MG PO TABS
10.0000 mg | ORAL_TABLET | Freq: Every day | ORAL | Status: DC
  Administered 2019-02-10 – 2019-02-14 (×5): 10 mg via ORAL
  Filled 2019-02-10 (×5): qty 1

## 2019-02-10 MED ORDER — RAMIPRIL 5 MG PO CAPS
5.0000 mg | ORAL_CAPSULE | Freq: Every day | ORAL | Status: DC
  Administered 2019-02-11 – 2019-02-20 (×10): 5 mg via ORAL
  Filled 2019-02-10 (×12): qty 1

## 2019-02-10 NOTE — Progress Notes (Signed)
PROGRESS NOTE  [name]  DOB: [birth year]  PCP: Ria Bush, MD OMB:[id number]  DOA: 02/07/2019  LOS: 2 days   Brief narrative: Patient is an 85 year old Caucasian female with PMH of HTN, HLD, DM2, CADs/pCABG, diastolic CHF, PAD, DVT, chronic venous insufficiency, diverticulosis, hemorrhoids, cystocele, depression. She presented to the ED on 11/5 with complaint of acute abdominal pain extending from epigastric area to her mid abdomen and right upper quadrant with no nausea or vomiting or fever or chills.   In the ED, patient was afebrile, hemodynamically stable. Work-up showed WBC count elevated to 25.7. Urinalysis showed cloudy amber-colored urine with moderate amount of leukocytes, many bacteria and WBC clumps. CT abdomen pelvis showed fluid-filled outpouching arising from the first portion of the duodenum with some irregular mural thickening and adjacent inflammation, concerning for contained perforation of a duodenal ulcer or inflamed duodenal diverticulum.  Patient was admitted to hospital service for further evaluation and management.  Urine culture and Blood culture obtained on admission both showed more than 100,000 CFU per mL of E. coli, pansensitive.  Subjective: Patient was seen and examined this morning.  Sitting up in chair.  Not in distress.  On low-flow oxygen via nasal cannula.  Patient's caretaker Ms. [name] was at bedside.  Assessment/Plan: E. coli UTI and bacteremia. -Initially presented with abdominal pain. -Urine culture and Blood culture obtained on admission both showed more than 100,000 CFU per mL of E. coli, pansensitive. -Switch from meropenem to IV Rocephin today.  Plan to switch to oral Omnicef at discharge to cover diverticulitis and UTI.  Duodenal diverticulitis  -Medical management with IV antibiotics was recommended.  Currently on IV Rocephin. -No surgical intervention required at this time. -GI consultation appreciated.  Acute kidney  injury -Creatinine normal at baseline.  Creatinine was elevated to 1.6 on presentation on admission.  Started on IV hydration.  Creatinine back to normal now.  Type 2 diabetes mellitus - HbA1c 6.9. -Patient takes Actos at home. Currently on hold and sliding scale insulin with Accu-Cheks.    Cardiovascular issues: HTN/ HLD /diastolic CHF/ CADs/p CABG/ PAD -Home meds include metoprolol, ramipril, Lasix, pravastatin and Eliquis. -Continue metoprolol at a higher dose.  Continue pravastatin.  Resume Eliquis. -Ramipril and Lasix are currently on hold for AKI.  Creatinine improved.  Resume both. -Currently on low-flow oxygen by nasal collar.  Expected to improve with reinitiation of Lasix.  History of DVT in 2015 -On Eliquis.    GERD  -PPI.  Mobility: Encourage ambulation.  PT eval ordered. DVT prophylaxis:  Eliquis. Code Status:   Code Status: Full Code  Family Communication:  Expected Discharge:  Per patient's caretaker Ms. [name], patient lives with 2 other elderly gentlemen.  Despite her own limited ability, she takes care of her them as well.  Ms. [name] thinks patient will benefit from a rehab/SNF placement.  Pending PT evaluation.  Consultants:  GI, surgery  Procedures:  None  Antimicrobials: Anti-infectives (From admission, onward)   Start     Dose/Rate Route Frequency Ordered Stop   02/10/19 1300  cefTRIAXone (ROCEPHIN) 2 g in sodium chloride 0.9 % 100 mL IVPB     2 g 200 mL/hr over 30 Minutes Intravenous Every 24 hours 02/10/19 1201 02/14/19 2359   02/10/19 1115  ceFAZolin (ANCEF) IVPB 1 g/50 mL premix  Status:  Discontinued     1 g 100 mL/hr over 30 Minutes Intravenous Every 8 hours 02/10/19 1106 02/10/19 1200   02/09/19 1600  meropenem (MERREM) 1 g in  sodium chloride 0.9 % 100 mL IVPB  Status:  Discontinued     1 g 200 mL/hr over 30 Minutes Intravenous Every 12 hours 02/09/19 1449 02/10/19 1104   02/08/19 1600  meropenem (MERREM) 500 mg in sodium chloride 0.9 % 100 mL  IVPB  Status:  Discontinued     500 mg 200 mL/hr over 30 Minutes Intravenous Every 12 hours 02/08/19 1515 02/09/19 1449   02/08/19 0430  cefTRIAXone (ROCEPHIN) 2 g in sodium chloride 0.9 % 100 mL IVPB  Status:  Discontinued     2 g 200 mL/hr over 30 Minutes Intravenous Every 24 hours 02/08/19 0428 02/08/19 1502   02/08/19 0430  metroNIDAZOLE (FLAGYL) IVPB 500 mg  Status:  Discontinued     500 mg 100 mL/hr over 60 Minutes Intravenous Every 8 hours 02/08/19 0428 02/08/19 1502   02/07/19 2330  piperacillin-tazobactam (ZOSYN) IVPB 3.375 g  Status:  Discontinued     3.375 g 100 mL/hr over 30 Minutes Intravenous  Once 02/07/19 2328 02/07/19 2329   02/07/19 2330  piperacillin-tazobactam (ZOSYN) IVPB 3.375 g     3.375 g 12.5 mL/hr over 240 Minutes Intravenous  Once 02/07/19 2329 02/08/19 0317      Diet Order            Diet Heart Room service appropriate? Yes; Fluid consistency: Thin  Diet effective now              Infusions:  . sodium chloride Stopped (02/09/19 1737)  . cefTRIAXone (ROCEPHIN)  IV    . diltiazem (CARDIZEM) infusion 10 mg/hr (02/10/19 1012)  . pantoprozole (PROTONIX) infusion 8 mg/hr (02/10/19 1058)    Scheduled Meds: . apixaban  5 mg Oral BID  . collagenase   Topical Daily  . diclofenac sodium  1 application Topical TID  . furosemide  10 mg Oral Daily  . insulin aspart  0-5 Units Subcutaneous QHS  . insulin aspart  0-9 Units Subcutaneous TID WC  . metoprolol tartrate  50 mg Oral BID  . multivitamin with minerals  1 tablet Oral Daily  . nystatin cream  1 application Topical BID  . [START ON 02/11/2019] pantoprazole  40 mg Intravenous Q12H  . pravastatin  20 mg Oral Daily  . ramipril  5 mg Oral Daily  . traZODone  150 mg Oral QHS  . vitamin B-12  1,000 mcg Oral Daily  . vitamin E  400 Units Oral Daily    PRN meds: sodium chloride, acetaminophen, ALPRAZolam, HYDROcodone-acetaminophen, ipratropium-albuterol, loratadine, metoprolol tartrate, morphine  injection, nitroGLYCERIN, ondansetron **OR** ondansetron (ZOFRAN) IV   Objective: Vitals:   02/10/19 1009 02/10/19 1105  BP: 123/81 117/64  Pulse:  (!) 102  Resp:  18  Temp:    SpO2:  93%    Intake/Output Summary (Last 24 hours) at 02/10/2019 1207 Last data filed at 02/10/2019 1000 Gross per 24 hour  Intake 1575.84 ml  Output 700 ml  Net 875.84 ml   Filed Weights   02/08/19 2347 02/09/19 1742 02/10/19 0430  Weight: 65.5 kg 52.7 kg 69.1 kg   Weight change: -12.8 kg Body mass index is 32.98 kg/m.   Physical Exam: General exam: Appears calm and comfortable.  Sitting up in chair.  Not in distress Skin: No rashes, lesions or ulcers. HEENT: Atraumatic, normocephalic, supple neck, no obvious bleeding Lungs: Clear to auscultation bilaterally CVS: Regular rate and rhythm, no murmur GI/Abd soft, mild to moderate epigastric tenderness, bowel sound present CNS: Alert, awake, oriented x3 Psychiatry: Mood appropriate  Extremities: No pedal edema, no calf tenderness  Data Review: I have personally reviewed the laboratory data and studies available.  Recent Labs  Lab 02/04/19 1409 02/07/19 1639 02/09/19 0529 02/10/19 0523  WBC 8.8 25.7* 11.9* 11.4*  NEUTROABS 6.1  --  10.0* 9.8*  HGB 12.8 12.4 12.1 11.6*  HCT 39.3 39.7 38.9 36.5  MCV 92.8 95.9 96.5 93.4  PLT 229.0 186 171 154   Recent Labs  Lab 02/04/19 1409 02/07/19 1639 02/09/19 0529 02/10/19 0523  NA 140 137 141 138  K 4.7 4.2 3.9 4.1  CL 102 100 109 109  CO2 31 26 24 23   GLUCOSE 94 100* 144* 177*  BUN 25* 31* 23 19  CREATININE 0.98 1.60* 0.77 0.67  CALCIUM 9.3 9.2 8.3* 8.2*    Terrilee Croak, MD  [facility] 02/10/2019

## 2019-02-10 NOTE — Progress Notes (Signed)
Notified NP of pt with expiratory wheezing, and fine crackles. Pt with cardizem gtt, protonix gtt, and ns @ 100. Will stop ns and continue to monitor pt.

## 2019-02-10 NOTE — Plan of Care (Signed)

## 2019-02-11 ENCOUNTER — Ambulatory Visit: Payer: Medicare Other | Admitting: Family Medicine

## 2019-02-11 ENCOUNTER — Telehealth: Payer: Self-pay

## 2019-02-11 ENCOUNTER — Telehealth: Payer: Self-pay | Admitting: Physician Assistant

## 2019-02-11 LAB — CBC WITH DIFFERENTIAL/PLATELET
Abs Immature Granulocytes: 0.09 10*3/uL — ABNORMAL HIGH (ref 0.00–0.07)
Basophils Absolute: 0 10*3/uL (ref 0.0–0.1)
Basophils Relative: 0 %
Eosinophils Absolute: 0.2 10*3/uL (ref 0.0–0.5)
Eosinophils Relative: 2 %
HCT: 36.4 % (ref 36.0–46.0)
Hemoglobin: 11.6 g/dL — ABNORMAL LOW (ref 12.0–15.0)
Immature Granulocytes: 1 %
Lymphocytes Relative: 9 %
Lymphs Abs: 1 10*3/uL (ref 0.7–4.0)
MCH: 30 pg (ref 26.0–34.0)
MCHC: 31.9 g/dL (ref 30.0–36.0)
MCV: 94.1 fL (ref 80.0–100.0)
Monocytes Absolute: 1.2 10*3/uL — ABNORMAL HIGH (ref 0.1–1.0)
Monocytes Relative: 11 %
Neutro Abs: 8.3 10*3/uL — ABNORMAL HIGH (ref 1.7–7.7)
Neutrophils Relative %: 77 %
Platelets: 172 10*3/uL (ref 150–400)
RBC: 3.87 MIL/uL (ref 3.87–5.11)
RDW: 14.4 % (ref 11.5–15.5)
WBC: 10.8 10*3/uL — ABNORMAL HIGH (ref 4.0–10.5)
nRBC: 0 % (ref 0.0–0.2)

## 2019-02-11 LAB — GLUCOSE, CAPILLARY
Glucose-Capillary: 173 mg/dL — ABNORMAL HIGH (ref 70–99)
Glucose-Capillary: 278 mg/dL — ABNORMAL HIGH (ref 70–99)
Glucose-Capillary: 226 mg/dL — ABNORMAL HIGH (ref 70–99)
Glucose-Capillary: 143 mg/dL — ABNORMAL HIGH (ref 70–99)

## 2019-02-11 LAB — BASIC METABOLIC PANEL
Anion gap: 7 (ref 5–15)
BUN: 28 mg/dL — ABNORMAL HIGH (ref 8–23)
CO2: 24 mmol/L (ref 22–32)
Calcium: 8.5 mg/dL — ABNORMAL LOW (ref 8.9–10.3)
Chloride: 107 mmol/L (ref 98–111)
Creatinine, Ser: 0.9 mg/dL (ref 0.44–1.00)
GFR calc Af Amer: >60 mL/min (ref >60)
GFR calc non Af Amer: 57 mL/min — ABNORMAL LOW (ref >60)
Glucose, Bld: 172 mg/dL — ABNORMAL HIGH (ref 70–99)
Potassium: 4.2 mmol/L (ref 3.5–5.1)
Sodium: 138 mmol/L (ref 135–145)

## 2019-02-11 MED ORDER — ALUM & MAG HYDROXIDE-SIMETH 200-200-20 MG/5ML PO SUSP
15.0000 mL | ORAL | Status: DC | PRN
  Administered 2019-02-11 – 2019-02-15 (×6): 15 mL via ORAL
  Filled 2019-02-11 (×6): qty 30

## 2019-02-11 MED ORDER — SODIUM CHLORIDE 0.9 % IV SOLN
3.0000 g | Freq: Four times a day (QID) | INTRAVENOUS | Status: DC
  Administered 2019-02-11 – 2019-02-14 (×10): 3 g via INTRAVENOUS
  Filled 2019-02-11: qty 8
  Filled 2019-02-11 (×6): qty 3
  Filled 2019-02-11 (×2): qty 8
  Filled 2019-02-11 (×2): qty 3
  Filled 2019-02-11: qty 8

## 2019-02-11 NOTE — Telephone Encounter (Signed)
[name], friend of the patient wanted to tell [name] that the patient has been in the hospital, and has been since 02/07/19. She plans on keeping her appointment for 11/24. c

## 2019-02-11 NOTE — Telephone Encounter (Signed)
Thanks for letting me know. [name], PA-C    02/11/2019 5:20 PM

## 2019-02-11 NOTE — Telephone Encounter (Signed)
Appointment cancelled. FYI to PCP   Client [place] Night - Client Client Site [place] Physician [facility] - PHYSICIAN, Verita Schneiders- MD Contact Type Call Who Is Calling Patient / Member / Family / Caregiver Caller Name Elnora Morrison Caller Phone Number [phone] Patient Name [name] Patient DOB 1927/07/16 Call Type Message Only Information Provided Reason for Call Request to [place] Appointment Initial Comment Caller states that patient is in the hospital and has been for 4 days and she will not make the appointment tomorrow at 2:30. She has an infection in her blood stream and in her stomach. Her heart is way out of rhythm. She is in room 247 at [facility]. Dr Unknown Additional Comment Call Closed By: Bryson Ha Transaction Date/Time: 02/10/2019 8:09:49 AM (ET)

## 2019-02-11 NOTE — TOC Progression Note (Signed)
Transition of Care [facility]) - Progression Note    Patient Details  Name: [name] MRN: [id number] Date of Birth: [birth year]  Transition of [facility] CM/SW Contact  Ross Ludwig, [place] Phone Number: 02/11/2019, 5:40 PM  Clinical Narrative:     CSW spoke to attending physician, he would like PT to reevaluate patient due to MD thinking patient needs to go to SNF first.  CSW contacted Tera Helper PT, to ask if she could see patient, she will see her tomorrow.  CSW to continue to follow patient's progress throughout discharge planning.  Patient has been referred to [facility] for home health needs.      Expected Discharge Plan and Services  Potentially discharging home once medically ready, unless PT recommends differently.                             Social Determinants of Health (SDOH) Interventions    Readmission Risk Interventions No flowsheet data found.

## 2019-02-11 NOTE — Plan of Care (Signed)
  Problem: Education: Goal: Knowledge of General Education information will improve Description: Including pain rating scale, medication(s)/side effects and non-pharmacologic comfort measures Outcome: Progressing   Problem: Clinical Measurements: Goal: Will remain free from infection Outcome: Progressing   

## 2019-02-11 NOTE — Care Management Important Message (Signed)
Important Message  Patient Details  Name: [name] MRN: [id number] Date of Birth: 1927-11-29   Medicare Important Message Given:  Yes     Dannette Barbara 02/11/2019, 11:58 AM

## 2019-02-11 NOTE — Progress Notes (Signed)
PROGRESS NOTE  [name]  DOB: [birth year]  PCP: Ria Bush, MD MEQ:[id number]  DOA: 02/07/2019  LOS: 3 days   Brief narrative: Patient is an 85 year old Caucasian female with PMH of HTN, HLD, DM2, CADs/pCABG, diastolic CHF, PAD, DVT, chronic venous insufficiency, diverticulosis, hemorrhoids, cystocele, depression. She presented to the ED on 11/5 with complaint of acute abdominal pain extending from epigastric area to her mid abdomen and right upper quadrant wit  h no nausea or vomiting or fever or chills.   In the ED, patient was afebrile, hemodynamically stable. Work-up showed WBC count elevated to 25.7. Urinalysis showed cloudy amber-colored urine with moderate amount of leukocytes, many bacteria and WBC clumps. CT abdomen pelvis showed fluid-filled outpouching arising from the first portion of the duodenum with some irregular mural thickening and adjacent inflammation, concerning for contained perforation of a duodenal ulcer or inflamed duodenal diverticulum.  Patient was admitted to hospital service for further evaluation and management.  Urine culture and Blood culture obtained on admission both showed more than 100,000 CFU per mL of E. coli, pansensitive.  Subjective: Patient was seen and examined this morning.  Sitting up in chair.  Seems to be on mild respiratory distress, on low-flow oxygen by nasal cannula.  Patient's caretaker was at bedside.  Assessment/Plan: E. coli UTI and bacteremia. -Initially presented with abdominal pain. -Urine culture and Blood culture obtained on admission both showed more than 100,000 CFU per mL of E. coli, pansensitive. -Switch antibiotics to IV Unasyn today.  Plan to switch to oral Augmentin at discharge.  Duodenal diverticulitis  -Medical management with IV antibiotics was recommended.  Currently on IV Unasyn. -No surgical intervention required at this time. -GI consultation appreciated.  Acute kidney injury -Creatinine normal  at baseline.  Creatinine was elevated to 1.6 on presentation on admission.  Started on IV hydration.  Creatinine back to normal now.  Currently not on IV antibiotics.  A. fib with RVR -Patient has a history of A. Fib and is on oral metoprolol and Eliquis at home. -In the second day of hospitalization, she started having RVR, did not improve with increased dose of metoprolol.  Cardizem drip was started.  Currently rate controlled. -RVR was likely because of bacteremia.  Wean down on Cardizem drip.  Other cardiovascular issues: HTN/ HLD /diastolic CHF/ CADs/p CABG/ PAD -Home meds include metoprolol, ramipril, Lasix, pravastatin and Eliquis. -Resumed all.  Metoprolol at a higher dose because of RVR. -Currently on low-flow oxygen by nasal cannula.  Expected to improve with reinitiation of Lasix.  Type 2 diabetes mellitus - HbA1c 6.9. -Patient takes Actos at home. Currently on hold and sliding scale insulin with Accu-Cheks.    History of DVT in 2015 -On Eliquis.    GERD  -PPI.  Mobility: Encourage ambulation.  PT eval ordered. DVT prophylaxis:  Eliquis. Code Status:    Code Status: Full Code  Family Communication:  Expected Discharge:  Home health PT recommended by physical therapy.  I think patient will benefit from reassessment by physical therapy.  Consultants:  GI, surgery  Procedures:  None  Antimicrobials: Anti-infectives (From admission, onward)   Start     Dose/Rate Route Frequency Ordered Stop   02/12/19 0000  Ampicillin-Sulbactam (UNASYN) 3 g in sodium chloride 0.9 % 100 mL IVPB     3 g 200 mL/hr over 30 Minutes Intravenous Every 6 hours 02/11/19 1306 02/14/19 2359   02/10/19 1300  cefTRIAXone (ROCEPHIN) 2 g in sodium chloride 0.9 % 100 mL IVPB  Status:  Discontinued     2 g 200 mL/hr over 30 Minutes Intravenous Every 24 hours 02/10/19 1201 02/11/19 1306   02/10/19 1115  ceFAZolin (ANCEF) IVPB 1 g/50 mL premix  Status:  Discontinued     1 g 100 mL/hr over 30  Minutes Intravenous Every 8 hours 02/10/19 1106 02/10/19 1200   02/09/19 1600  meropenem (MERREM) 1 g in sodium chloride 0.9 % 100 mL IVPB  Status:  Discontinued     1 g 200 mL/hr over 30 Minutes Intravenous Every 12 hours 02/09/19 1449 02/10/19 1104   02/08/19 1600  meropenem (MERREM) 500 mg in sodium chloride 0.9 % 100 mL IVPB  Status:  Discontinued     500 mg 200 mL/hr over 30 Minutes Intravenous Every 12 hours 02/08/19 1515 02/09/19 1449   02/08/19 0430  cefTRIAXone (ROCEPHIN) 2 g in sodium chloride 0.9 % 100 mL IVPB  Status:  Discontinued     2 g 200 mL/hr over 30 Minutes Intravenous Every 24 hours 02/08/19 0428 02/08/19 1502   02/08/19 0430  metroNIDAZOLE (FLAGYL) IVPB 500 mg  Status:  Discontinued     500 mg 100 mL/hr over 60 Minutes Intravenous Every 8 hours 02/08/19 0428 02/08/19 1502   02/07/19 2330  piperacillin-tazobactam (ZOSYN) IVPB 3.375 g  Status:  Discontinued     3.375 g 100 mL/hr over 30 Minutes Intravenous  Once 02/07/19 2328 02/07/19 2329   02/07/19 2330  piperacillin-tazobactam (ZOSYN) IVPB 3.375 g     3.375 g 12.5 mL/hr over 240 Minutes Intravenous  Once 02/07/19 2329 02/08/19 0317      Diet Order            Diet Heart Room service appropriate? Yes; Fluid consistency: Thin  Diet effective now              Infusions:  . sodium chloride Stopped (02/09/19 1737)  . [START ON 02/12/2019] ampicillin-sulbactam (UNASYN) IV    . diltiazem (CARDIZEM) infusion 7.5 mg/hr (02/11/19 1230)    Scheduled Meds: . apixaban  5 mg Oral BID  . collagenase   Topical Daily  . diclofenac sodium  1 application Topical TID  . furosemide  10 mg Oral Daily  . insulin aspart  0-5 Units Subcutaneous QHS  . insulin aspart  0-9 Units Subcutaneous TID WC  . metoprolol tartrate  50 mg Oral BID  . multivitamin with minerals  1 tablet Oral Daily  . nystatin cream  1 application Topical BID  . pantoprazole  40 mg Intravenous Q12H  . pravastatin  20 mg Oral Daily  . ramipril  5 mg  Oral Daily  . traZODone  150 mg Oral QHS  . vitamin B-12  1,000 mcg Oral Daily  . vitamin E  400 Units Oral Daily    PRN meds: sodium chloride, acetaminophen, ALPRAZolam, HYDROcodone-acetaminophen, ipratropium-albuterol, loratadine, metoprolol tartrate, morphine injection, nitroGLYCERIN, ondansetron **OR** ondansetron (ZOFRAN) IV   Objective: Vitals:   02/11/19 0612 02/11/19 0726  BP: (!) 115/57 119/63  Pulse: (!) 53 87  Resp: 16   Temp: 98.4 F (36.9 C) 97.6 F (36.4 C)  SpO2: 96% 97%    Intake/Output Summary (Last 24 hours) at 02/11/2019 1328 Last data filed at 02/11/2019 0943 Gross per 24 hour  Intake 888.67 ml  Output 450 ml  Net 438.67 ml   Filed Weights   02/08/19 2347 02/09/19 1742 02/10/19 0430  Weight: 65.5 kg 52.7 kg 69.1 kg   Weight change:  Body mass index is 32.98 kg/m.  Physical Exam: General exam: Appears calm and comfortable.  Sitting up in chair.  Mild respiratory distress. Skin: No rashes, lesions or ulcers. HEENT: Atraumatic, normocephalic, supple neck, no obvious bleeding Lungs: Clear to auscultation bilaterally CVS: Regular rate and rhythm, no murmur GI/Abd soft, mild to moderate epigastric tenderness, bowel sound present CNS: Alert, awake, oriented x3 Psychiatry: Mood appropriate Extremities: No pedal edema, no calf tenderness  Data Review: I have personally reviewed the laboratory data and studies available.  Recent Labs  Lab 02/04/19 1409 02/07/19 1639 02/09/19 0529 02/10/19 0523 02/11/19 0433  WBC 8.8 25.7* 11.9* 11.4* 10.8*  NEUTROABS 6.1  --  10.0* 9.8* 8.3*  HGB 12.8 12.4 12.1 11.6* 11.6*  HCT 39.3 39.7 38.9 36.5 36.4  MCV 92.8 95.9 96.5 93.4 94.1  PLT 229.0 186 171 154 172   Recent Labs  Lab 02/04/19 1409 02/07/19 1639 02/09/19 0529 02/10/19 0523 02/11/19 0433  NA 140 137 141 138 138  K 4.7 4.2 3.9 4.1 4.2  CL 102 100 109 109 107  CO2 31 26 24 23 24   GLUCOSE 94 100* 144* 177* 172*  BUN 25* 31* 23 19 28*   CREATININE 0.98 1.60* 0.77 0.67 0.90  CALCIUM 9.3 9.2 8.3* 8.2* 8.5*    Terrilee Croak, MD  [facility] 02/11/2019

## 2019-02-12 ENCOUNTER — Inpatient Hospital Stay: Payer: Medicare Other

## 2019-02-12 LAB — GLUCOSE, CAPILLARY
Glucose-Capillary: 153 mg/dL — ABNORMAL HIGH (ref 70–99)
Glucose-Capillary: 214 mg/dL — ABNORMAL HIGH (ref 70–99)
Glucose-Capillary: 155 mg/dL — ABNORMAL HIGH (ref 70–99)
Glucose-Capillary: 181 mg/dL — ABNORMAL HIGH (ref 70–99)

## 2019-02-12 MED ORDER — METOPROLOL TARTRATE 25 MG PO TABS
75.0000 mg | ORAL_TABLET | Freq: Two times a day (BID) | ORAL | Status: DC
  Administered 2019-02-12 – 2019-02-14 (×4): 75 mg via ORAL
  Filled 2019-02-12 (×4): qty 3

## 2019-02-12 MED ORDER — METOPROLOL TARTRATE 25 MG PO TABS
25.0000 mg | ORAL_TABLET | Freq: Once | ORAL | Status: AC
  Administered 2019-02-12: 25 mg via ORAL
  Filled 2019-02-12: qty 1
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
## 2019-02-12 NOTE — Progress Notes (Signed)
Patients HR increased to 130's afib this morning. MD notified. Patient given morning dose of metoprolol. Dose increased by 25 mg. Will continue to monitor.

## 2019-02-12 NOTE — Plan of Care (Signed)
  Problem: Clinical Measurements: Goal: Ability to maintain clinical measurements within normal limits will improve Outcome: Progressing Goal: Respiratory complications will improve Outcome: Progressing Goal: Cardiovascular complication will be avoided Outcome: Progressing   Problem: Activity: Goal: Risk for activity intolerance will decrease Outcome: Progressing   Problem: Pain Managment: Goal: General experience of comfort will improve Outcome: Progressing   Problem: Safety: Goal: Ability to remain free from injury will improve Outcome: Progressing   Problem: Skin Integrity: Goal: Risk for impaired skin integrity will decrease Outcome: Progressing   

## 2019-02-12 NOTE — Progress Notes (Signed)
SATURATION QUALIFICATIONS: (This note is used to comply with regulatory documentation for home oxygen)  Patient Saturations on Room Air at Rest = 85%  Patient Saturations on Room Air while Ambulating = 85%  Patient Saturations on 2 Liters of oxygen while Ambulating = 88%  Please briefly explain why patient needs home oxygen: Patient ambulated to door of room with walker. Patient tolerated well. Oxygen increased to 90% on 2L via nasal cannula while sitting in chair.

## 2019-02-12 NOTE — TOC Initial Note (Signed)
Transition of Care [facility]) - Initial/Assessment Note    Patient Details  Name: [name] MRN: [id number] Date of Birth: [birth year]  [facility]) CM/SW Contact:    Ross Ludwig, LCSW Phone Number: 02/12/2019, 5:28 PM  Clinical Narrative:                  Patient is an 85 year old female who is alert and oriented x4.  Patient was unavailable to speak to her, CSW attempted to contact patient daughter and she stated her mom does not want daughter knowing her business.  CSW read PT recommendations for home health, CSW spoke with [facility], and they are able to accept patient.  CSW to continue to follow patient's progress throughout discharge planning.  CSW to follow up with patient at a later time to see if she needs any equipment.  Expected Discharge Plan: [place] Barriers to Discharge: Continued Medical Work up   Patient Goals and CMS Choice Patient states their goals for this hospitalization and ongoing recovery are:: To return back home with home health. CMS Medicare.gov Compare Post Acute Care list provided to:: Patient Choice offered to / list presented to : Adult Children  Expected Discharge Plan and Services Expected Discharge Plan: [place] Choice: [place] arrangements for the past 2 months: Single Family Home                 DME Arranged: N/A         HH Arranged: PT HH Agency: [facility] [employer] Date [place]: 02/12/19 Time [place]: 1400 Representative spoke with at [place]: [place] Arrangements/Services Living arrangements for the past 2 months: [place] Lives with:: Self Patient language and need for interpreter reviewed:: No Do you feel safe going back to the place where you live?: Yes      Need for Family Participation in Patient Care: No (Comment) Care giver support system in place?: No  (comment)   Criminal Activity/Legal Involvement Pertinent to Current Situation/Hospitalization: No - Comment as needed  Activities of Daily Living Home Assistive Devices/Equipment: Walker (specify type) ADL Screening (condition at time of admission) Patient's cognitive ability adequate to safely complete daily activities?: Yes Is the patient deaf or have difficulty hearing?: Yes Does the patient have difficulty seeing, even when wearing glasses/contacts?: No Does the patient have difficulty concentrating, remembering, or making decisions?: No Patient able to express need for assistance with ADLs?: Yes Does the patient have difficulty dressing or bathing?: No Independently performs ADLs?: Yes (appropriate for developmental age) Does the patient have difficulty walking or climbing stairs?: No Weakness of Legs: Both Weakness of Arms/Hands: None  Permission Sought/Granted         Permission granted to share info w AGENCY: [place]        Emotional Assessment Appearance:: Appears stated age Attitude/Demeanor/Rapport: Complaining Affect (typically observed): Agitated Orientation: : Oriented to Self, Oriented to Place, Oriented to  Time, Oriented to Situation Alcohol / Substance Use: Not Applicable Psych Involvement: No (comment)  Admission diagnosis:  Duodenal ulcer perforation ([place]) [K26.5] Patient Active Problem List   Diagnosis Date Noted  . Bacteremia due to Gram-negative bacteria 02/09/2019  . Duodenal ulcer perforation ([place]) 02/08/2019  . Chronic leg pain 11/14/2018  . Atherosclerosis of native arteries of the extremities with ulceration ([place]) 08/21/2018  . Lumbar strain,  initial encounter 07/18/2018  . Chronic constipation 06/18/2018  . Erythema of breast 04/18/2018  . MRSA cellulitis 03/31/2018  . Advanced directives, counseling/discussion 01/29/2018  . Bilateral hearing loss 01/29/2018  . Spinal stenosis of lumbosacral region 01/08/2018  . Acute  right-sided low back pain 12/26/2017  . Leg wound, right, subsequent encounter 10/30/2017  . Systolic murmur 03/50/0938  . De Quervain's tenosynovitis, left 01/09/2017  . Encounter for chronic pain management 02/09/2016  . Paroxysmal atrial fibrillation (HCC)   . Varicose veins of left lower extremity with both ulcer of calf and inflammation ([place]) 05/22/2015  . Varicose veins of right lower extremity with both ulcer of calf and inflammation ([place]) 01/23/2015  . Diabetes mellitus type 2 with peripheral artery disease ([place])   . Chronic venous insufficiency   . Bleeding from varicose veins of lower extremity, left 02/13/2014  . Diabetic polyneuropathy ([place]) 11/19/2013  . Type 2 diabetes mellitus with neurological manifestations, controlled ([place]) 11/19/2013  . Right shoulder pain 08/19/2013  . Cerumen impaction 04/16/2013  . Abdominal pain 05/14/2012  . Peptic ulcer disease   . Unsteady gait 12/13/2011  . Chronic lower back pain 09/14/2011  . Microcytic anemia 09/06/2010  . Hyperlipidemia 09/06/2010  . GAD (generalized anxiety disorder) 03/15/2010  . Obesity, Class I, BMI 30-34.9 03/15/2010  . Hypertension 03/15/2010  . (HFpEF) heart failure with preserved ejection fraction ([place]) 03/15/2010  . Coronary artery disease    PCP:  Ria Bush, MD Pharmacy:   [id number] [place], [address] [place] [address] Phone: [phone] Fax: [phone]     Social Determinants of Health (SDOH) Interventions    Readmission Risk Interventions No flowsheet data found.

## 2019-02-12 NOTE — Plan of Care (Signed)
°  Problem: Clinical Measurements: °Goal: Respiratory complications will improve °Outcome: Progressing °Goal: Cardiovascular complication will be avoided °Outcome: Progressing °  °Problem: Activity: °Goal: Risk for activity intolerance will decrease °Outcome: Progressing °  °

## 2019-02-12 NOTE — Progress Notes (Signed)
PROGRESS NOTE  [name]  DOB: June 16, 1927  PCP: Ria Bush, MD AUQ:[id number]  DOA: 02/07/2019  LOS: 4 days   Brief narrative: Patient is an 85 year old Caucasian female with PMH of HTN, HLD, DM2, CAD s/p CABG, diastolic CHF, PAD, DVT, chronic venous insufficiency, diverticulosis, hemorrhoids, cystocele, depression. She presented to the ED on 11/5 with complaint of acute abdominal pain extending from epigastric area to her mid abdomen and right upper quadrant with no nausea or vomiting or fever or chills.   In the ED, patient was afebrile, hemodynamically stable. Work-up showed WBC count elevated to 25.7. Urinalysis showed cloudy amber-colored urine with moderate amount of leukocytes, many bacteria and WBC clumps. CT abdomen pelvis showed fluid-filled outpouching arising from the first portion of the duodenum with some irregular mural thickening and adjacent inflammation, concerning for contained perforation of a duodenal ulcer or inflamed duodenal diverticulum.  Patient was admitted to hospitalist service for further evaluation and management.  Urine culture and Blood culture obtained on admission both showed more than 100,000 CFU per mL of E. coli, pansensitive.  Subjective: Patient was seen and examined this morning.  Sitting up in chair.  Seems to be on mild respiratory distress, on low-flow oxygen by nasal cannula.  Patient's caretaker was at bedside.  Assessment/Plan: E. coli UTI and bacteremia. -Initially presented with abdominal pain. -Urine culture and Blood culture obtained on admission both showed more than 100,000 CFU per mL of E. coli, pansensitive. -Switched antibiotics to IV Unasyn.  Plan is to switch to oral Augmentin at discharge.  Duodenal diverticulitis  -Medical management with IV antibiotics was recommended.  Currently on IV Unasyn. -No surgical intervention required at this time. -GI consultation appreciated.  Acute respiratory failure with hypoxia  -Not on oxygen prior to admission. -Lungs clear to auscultation bilaterally. -I am surprised, she has not had a single chest x-ray done on this admission.  Obtain chest x-ray today.  Acute kidney injury -Creatinine normal at baseline. Creatinine was elevated to 1.6 on presentation on admission.  Started on IV hydration. Creatinine back to normal now.  Diuretics resumed  A. fib with RVR -Patient has a history of A. Fib and is on oral metoprolol 37.5 mg twice daily and Eliquis at home. -In the second day of hospitalization, she started having RVR, likely because of bacteremia.   -Cardizem drip was initiated.  Metoprolol dose was increased and Cardizem drip was stopped.  Currently on metoprolol 75 mg twice daily.  Continue to monitor in telemetry.    Other cardiovascular issues: HTN/ HLD /diastolic CHF/ CADs/p CABG/ PAD -Home meds include metoprolol, ramipril, Lasix, pravastatin and Eliquis. -Resumed all.  Metoprolol at a higher dose because of RVR. -Currently on low-flow oxygen by nasal cannula.  Expected to improve with reinitiation of Lasix.  Type 2 diabetes mellitus - HbA1c 6.9. -Patient takes Actos at home. Currently on hold and sliding scale insulin with Accu-Cheks.    History of DVT in 2015 -On Eliquis.    GERD  -PPI.  Mobility: Encourage ambulation.  PT eval appreciated.  Home health PT recommended. DVT prophylaxis:  Eliquis. Code Status:    Code Status: Full Code  Family Communication:  Expected Discharge:  Home health PT recommended by physical therapy.  If cardiac rhythm remains stable for the next 24 hours, can discharge home tomorrow. Consultants:  GI, surgery  Procedures:  None  Antimicrobials: Anti-infectives (From admission, onward)   Start     Dose/Rate Route Frequency Ordered Stop   02/12/19 0000  Ampicillin-Sulbactam (UNASYN) 3 g in sodium chloride 0.9 % 100 mL IVPB     3 g 200 mL/hr over 30 Minutes Intravenous Every 6 hours 02/11/19 1306 02/14/19 2359    02/10/19 1300  cefTRIAXone (ROCEPHIN) 2 g in sodium chloride 0.9 % 100 mL IVPB  Status:  Discontinued     2 g 200 mL/hr over 30 Minutes Intravenous Every 24 hours 02/10/19 1201 02/11/19 1306   02/10/19 1115  ceFAZolin (ANCEF) IVPB 1 g/50 mL premix  Status:  Discontinued     1 g 100 mL/hr over 30 Minutes Intravenous Every 8 hours 02/10/19 1106 02/10/19 1200   02/09/19 1600  meropenem (MERREM) 1 g in sodium chloride 0.9 % 100 mL IVPB  Status:  Discontinued     1 g 200 mL/hr over 30 Minutes Intravenous Every 12 hours 02/09/19 1449 02/10/19 1104   02/08/19 1600  meropenem (MERREM) 500 mg in sodium chloride 0.9 % 100 mL IVPB  Status:  Discontinued     500 mg 200 mL/hr over 30 Minutes Intravenous Every 12 hours 02/08/19 1515 02/09/19 1449   02/08/19 0430  cefTRIAXone (ROCEPHIN) 2 g in sodium chloride 0.9 % 100 mL IVPB  Status:  Discontinued     2 g 200 mL/hr over 30 Minutes Intravenous Every 24 hours 02/08/19 0428 02/08/19 1502   02/08/19 0430  metroNIDAZOLE (FLAGYL) IVPB 500 mg  Status:  Discontinued     500 mg 100 mL/hr over 60 Minutes Intravenous Every 8 hours 02/08/19 0428 02/08/19 1502   02/07/19 2330  piperacillin-tazobactam (ZOSYN) IVPB 3.375 g  Status:  Discontinued     3.375 g 100 mL/hr over 30 Minutes Intravenous  Once 02/07/19 2328 02/07/19 2329   02/07/19 2330  piperacillin-tazobactam (ZOSYN) IVPB 3.375 g     3.375 g 12.5 mL/hr over 240 Minutes Intravenous  Once 02/07/19 2329 02/08/19 0317      Diet Order            Diet Carb Modified Fluid consistency: Thin; Room service appropriate? Yes  Diet effective now              Infusions:  . sodium chloride 250 mL (02/12/19 0509)  . ampicillin-sulbactam (UNASYN) IV 3 g (02/12/19 1218)    Scheduled Meds: . apixaban  5 mg Oral BID  . collagenase   Topical Daily  . diclofenac sodium  1 application Topical TID  . furosemide  10 mg Oral Daily  . insulin aspart  0-5 Units Subcutaneous QHS  . insulin aspart  0-9 Units  Subcutaneous TID WC  . metoprolol tartrate  75 mg Oral BID  . multivitamin with minerals  1 tablet Oral Daily  . nystatin cream  1 application Topical BID  . pantoprazole  40 mg Intravenous Q12H  . pravastatin  20 mg Oral Daily  . ramipril  5 mg Oral Daily  . traZODone  150 mg Oral QHS  . vitamin B-12  1,000 mcg Oral Daily  . vitamin E  400 Units Oral Daily    PRN meds: sodium chloride, acetaminophen, ALPRAZolam, alum & mag hydroxide-simeth, HYDROcodone-acetaminophen, ipratropium-albuterol, loratadine, metoprolol tartrate, morphine injection, nitroGLYCERIN, ondansetron **OR** ondansetron (ZOFRAN) IV   Objective: Vitals:   02/12/19 0741 02/12/19 1007  BP: 128/71   Pulse: (!) 107 97  Resp: 18   Temp: 98.6 F (37 C)   SpO2: 96%     Intake/Output Summary (Last 24 hours) at 02/12/2019 1309 Last data filed at 02/12/2019 1000 Gross per 24 hour  Intake  633.98 ml  Output 1600 ml  Net -966.02 ml   Filed Weights   02/08/19 2347 02/09/19 1742 02/10/19 0430  Weight: 65.5 kg 52.7 kg 69.1 kg   Weight change:  Body mass index is 32.98 kg/m.   Physical Exam: General exam: Appears calm and comfortable.  Sitting up in chair.  On 2 to 3 L oxygen by nasal cannula. Skin: No rashes, lesions or ulcers. HEENT: Atraumatic, normocephalic, supple neck, no obvious bleeding Lungs: Clear to auscultation bilaterally.  No wheezing. CVS: Regular rate and rhythm, no murmur GI/Abd soft, mild to moderate epigastric tenderness, bowel sound present CNS: Alert, awake, oriented x3.  Hard of hearing. Psychiatry: Mood appropriate Extremities: No pedal edema, no calf tenderness  Data Review: I have personally reviewed the laboratory data and studies available.  Recent Labs  Lab 02/07/19 1639 02/09/19 0529 02/10/19 0523 02/11/19 0433  WBC 25.7* 11.9* 11.4* 10.8*  NEUTROABS  --  10.0* 9.8* 8.3*  HGB 12.4 12.1 11.6* 11.6*  HCT 39.7 38.9 36.5 36.4  MCV 95.9 96.5 93.4 94.1  PLT 186 171 154 172    Recent Labs  Lab 02/07/19 1639 02/09/19 0529 02/10/19 0523 02/11/19 0433  NA 137 141 138 138  K 4.2 3.9 4.1 4.2  CL 100 109 109 107  CO2 26 24 23 24   GLUCOSE 100* 144* 177* 172*  BUN 31* 23 19 28*  CREATININE 1.60* 0.77 0.67 0.90  CALCIUM 9.2 8.3* 8.2* 8.5*    Terrilee Croak, MD  [facility] 02/12/2019

## 2019-02-13 DIAGNOSIS — J9601 Acute respiratory failure with hypoxia: Secondary | ICD-10-CM | POA: Diagnosis present

## 2019-02-13 DIAGNOSIS — E785 Hyperlipidemia, unspecified: Secondary | ICD-10-CM

## 2019-02-13 DIAGNOSIS — I5032 Chronic diastolic (congestive) heart failure: Secondary | ICD-10-CM | POA: Diagnosis present

## 2019-02-13 DIAGNOSIS — R7881 Bacteremia: Secondary | ICD-10-CM | POA: Diagnosis present

## 2019-02-13 DIAGNOSIS — N179 Acute kidney failure, unspecified: Secondary | ICD-10-CM

## 2019-02-13 DIAGNOSIS — K5712 Diverticulitis of small intestine without perforation or abscess without bleeding: Secondary | ICD-10-CM | POA: Diagnosis present

## 2019-02-13 DIAGNOSIS — B9620 Unspecified Escherichia coli [E. coli] as the cause of diseases classified elsewhere: Secondary | ICD-10-CM | POA: Diagnosis present

## 2019-02-13 DIAGNOSIS — J9621 Acute and chronic respiratory failure with hypoxia: Secondary | ICD-10-CM | POA: Diagnosis present

## 2019-02-13 DIAGNOSIS — R0600 Dyspnea, unspecified: Secondary | ICD-10-CM

## 2019-02-13 LAB — BASIC METABOLIC PANEL
Anion gap: 9 (ref 5–15)
BUN: 19 mg/dL (ref 8–23)
CO2: 30 mmol/L (ref 22–32)
Calcium: 8.9 mg/dL (ref 8.9–10.3)
Chloride: 100 mmol/L (ref 98–111)
Creatinine, Ser: 0.61 mg/dL (ref 0.44–1.00)
GFR calc Af Amer: >60 mL/min (ref >60)
GFR calc non Af Amer: >60 mL/min (ref >60)
Glucose, Bld: 173 mg/dL — ABNORMAL HIGH (ref 70–99)
Potassium: 4.1 mmol/L (ref 3.5–5.1)
Sodium: 139 mmol/L (ref 135–145)

## 2019-02-13 LAB — GLUCOSE, CAPILLARY
Glucose-Capillary: 149 mg/dL — ABNORMAL HIGH (ref 70–99)
Glucose-Capillary: 250 mg/dL — ABNORMAL HIGH (ref 70–99)
Glucose-Capillary: 183 mg/dL — ABNORMAL HIGH (ref 70–99)
Glucose-Capillary: 187 mg/dL — ABNORMAL HIGH (ref 70–99)

## 2019-02-13 MED ORDER — FUROSEMIDE 10 MG/ML IJ SOLN
20.0000 mg | Freq: Once | INTRAMUSCULAR | Status: AC
  Administered 2019-02-13: 20 mg via INTRAVENOUS
  Filled 2019-02-13: qty 2

## 2019-02-13 MED ORDER — PANTOPRAZOLE SODIUM 40 MG PO TBEC
40.0000 mg | DELAYED_RELEASE_TABLET | Freq: Two times a day (BID) | ORAL | Status: DC
  Administered 2019-02-13 – 2019-02-20 (×14): 40 mg via ORAL
  Filled 2019-02-13 (×14): qty 1

## 2019-02-13 NOTE — Progress Notes (Signed)
PROGRESS NOTE    [name]  CVE:[id number] DOB: 04/29/1927 DOA: 02/07/2019 PCP: Ria Bush, MD    Brief Narrative:  Patient is an 85 year old Caucasian female with PMH of HTN, HLD, DM2, CAD s/p CABG, diastolic CHF, PAD, DVT, chronic venous insufficiency, diverticulosis, hemorrhoids, cystocele, depression. She presented to the ED on 11/5 with complaint of acute abdominal pain extending from epigastric area to her mid abdomen and right upper quadrantwith no nausea or vomiting or fever or chills.   In the ED, patient was afebrile, hemodynamically stable. Work-up showed WBC count elevated to 25.7. Urinalysis showed cloudy amber-colored urine with moderate amount of leukocytes, many bacteria and WBC clumps. CT abdomen pelvis showed fluid-filled outpouching arising from the first portion of the duodenum with some irregular mural thickening and adjacent inflammation, concerning for contained perforation of a duodenal ulcer or inflamed duodenal diverticulum.  Patient was admitted to hospitalist service for further evaluation and management.  Urine culture and Blood culture obtained on admission both showed more than 100,000 CFU per mL of E. coli, pansensitive.   Assessment & Plan:   Principal Problem:   E coli bacteremia Active Problems:   Diverticulitis of duodenum   Coronary artery disease   Hypertension   Hyperlipidemia   Type 2 diabetes mellitus with neurological manifestations, controlled ([place])   Diabetes mellitus type 2 with peripheral artery disease (HCC)   Paroxysmal atrial fibrillation (HCC)   Bacteremia due to Gram-negative bacteria   Dyspnea   Acute respiratory failure with hypoxia (HCC)   AKI (acute kidney injury) (HCC)   Chronic diastolic CHF (congestive heart failure) (HCC)   E. coli UTI   1 E. coli bacteremia/E. coli UTI Patient initially had presented with abdominal pain.  Patient was pancultured and urine cultures and blood cultures positive for E.  coli which was pansensitive.  Patient initially was on IV Zosyn and subsequently IV Rocephin and has been now on IV Unasyn.  Likely transition to Augmentin tomorrow or on discharge to complete a 10-day course of antibiotic treatment.  Follow.  2.  Duodenal diverticulitis Patient seen in consultation by GI and general surgery who recommended medical management with IV antibiotics.  Patient currently on IV Unasyn.  Likely transition to oral Augmentin tomorrow or on discharge to complete a 10-day course of antibiotic treatment.  Per general surgery no surgical intervention required at this time.  Appreciate GI input and recommendations.  3.  Acute respiratory failure with hypoxia Patient noted not to be on oxygen prior to admission.  Chest x-ray done negative for any acute infiltrates.  Patient noted to have some coarse breath sounds/bibasilar crackles.  Patient was resumed on home dose oral Lasix at 10 mg daily.  Urine output not properly recorded.  Patient's current weight of 155 pounds from 143.96 pounds on admission.  We will give Lasix 20 mg IV x1.  Ambulatory sats tomorrow.  Supportive care.  4.  Acute kidney injury Creatinine noted to be elevated at 1.6 on day of presentation.  Patient placed on IV fluids with hydration.  Acute kidney injury resolved.  Monitor with diuretics.  5.  Paroxysmal atrial fibrillation with RVR Patient with a history of A. fib on oral metoprolol 37.5 mg twice daily as well as Eliquis for anticoagulation.  On hospital day 2 patient noted to be in RVR failure likely secondary to acute infection of bacteremia.  Patient initially placed on a Cardizem drip which has subsequently been weaned off.  Patient currently on increased dose of metoprolol  75 mg twice daily.  Continue Eliquis for anticoagulation.  Outpatient follow-up.  6.  Chronic diastolic CHF/coronary artery disease status post CABG/PAD/hypertension/hyperlipidemia Currently stable.  Continue increased dose of  metoprolol, ramipril, Lasix, statin and Eliquis.  We will give a dose of Lasix 20 mg IV x1 due to hypoxia.  Patient's Lasix resumed at 10 mg daily.  May need a higher dose of Lasix on discharge however follow for now.  7.  Well-controlled type 2 diabetes mellitus Hemoglobin A1c 6.9 (02/08/2019).  Patient noted to be on Actos prior to admission.  CBG of 149 this morning.  Continue to hold oral hypoglycemic agents.  Continue sliding scale insulin.   DVT prophylaxis: Eliquis Code Status: Full Family Communication: Updated patient.  No family at bedside. Disposition Plan: Likely home when clinically improved, improvement with hypoxia likely in the next 24 to 48 hours.   Consultants:   General surgery: Dr. Lysle Pearl 02/08/2019  Gastroenterology: Dr. Alice Reichert 02/08/2019  Procedures:   CT abdomen and pelvis 02/07/2019  Chest x-ray 02/12/2019  2D echo 02/10/2019  Antimicrobials:   IV Unasyn 02/12/2019>>>> 02/14/2019  IV Rocephin 02/08/2019>>>> 02/11/2019  IV Merrem 02/08/2019>>>> 02/09/2019  IV Zosyn 02/07/2019>>>>> 02/08/2019   Subjective: Patient sitting up in chair.  Patient states not feeling too well however denies any significant chest pain or significant shortness of breath.  Patient states feels better than on admission.  Patient denies any home O2 prior to admission.  Objective: Vitals:   02/13/19 0725 02/13/19 0830 02/13/19 0840 02/13/19 1100  BP: (!) 149/97     Pulse: (!) 106     Resp: 20     Temp: 98.3 F (36.8 C)     TempSrc: Oral     SpO2: 98% (!) 78% 97% 96%  Weight:      Height:        Intake/Output Summary (Last 24 hours) at 02/13/2019 1556 Last data filed at 02/13/2019 1432 Gross per 24 hour  Intake -  Output 1800 ml  Net -1800 ml   Filed Weights   02/09/19 1742 02/10/19 0430 02/13/19 0452  Weight: 52.7 kg 69.1 kg 70.5 kg    Examination:  General exam: Appears calm and comfortable  Respiratory system: Some coarse breath sounds in the bases/crackles.  No  wheezing.  Fair air movement. Respiratory effort normal. Cardiovascular system: S1 & S2 heard, RRR. No JVD, murmurs, rubs, gallops or clicks. No pedal edema. Gastrointestinal system: Abdomen is nondistended, soft and nontender. No organomegaly or masses felt. Normal bowel sounds heard. Central nervous system: Alert and oriented. No focal neurological deficits. Extremities: Symmetric 5 x 5 power. Skin: No rashes, lesions or ulcers Psychiatry: Judgement and insight appear normal. Mood & affect appropriate.     Data Reviewed: I have personally reviewed following labs and imaging studies  CBC: Recent Labs  Lab 02/07/19 1639 02/09/19 0529 02/10/19 0523 02/11/19 0433  WBC 25.7* 11.9* 11.4* 10.8*  NEUTROABS  --  10.0* 9.8* 8.3*  HGB 12.4 12.1 11.6* 11.6*  HCT 39.7 38.9 36.5 36.4  MCV 95.9 96.5 93.4 94.1  PLT 186 171 154 702   Basic Metabolic Panel: Recent Labs  Lab 02/07/19 1639 02/09/19 0529 02/10/19 0523 02/11/19 0433 02/13/19 0923  NA 137 141 138 138 139  K 4.2 3.9 4.1 4.2 4.1  CL 100 109 109 107 100  CO2 26 24 23 24 30   GLUCOSE 100* 144* 177* 172* 173*  BUN 31* 23 19 28* 19  CREATININE 1.60* 0.77 0.67 0.90 0.61  CALCIUM 9.2 8.3* 8.2* 8.5* 8.9   GFR: Estimated Creatinine Clearance: 38.7 mL/min (by C-G formula based on SCr of 0.61 mg/dL). Liver Function Tests: Recent Labs  Lab 02/07/19 1639  AST 26  ALT 18  ALKPHOS 94  BILITOT 0.8  PROT 6.7  ALBUMIN 3.6   Recent Labs  Lab 02/07/19 1639  LIPASE 23   No results for input(s): AMMONIA in the last 168 hours. Coagulation Profile: No results for input(s): INR, PROTIME in the last 168 hours. Cardiac Enzymes: No results for input(s): CKTOTAL, CKMB, CKMBINDEX, TROPONINI in the last 168 hours. BNP (last 3 results) No results for input(s): PROBNP in the last 8760 hours. HbA1C: No results for input(s): HGBA1C in the last 72 hours. CBG: Recent Labs  Lab 02/12/19 1154 02/12/19 1637 02/12/19 2124 02/13/19 0724  02/13/19 1207  GLUCAP 214* 155* 181* 149* 250*   Lipid Profile: No results for input(s): CHOL, HDL, LDLCALC, TRIG, CHOLHDL, LDLDIRECT in the last 72 hours. Thyroid Function Tests: No results for input(s): TSH, T4TOTAL, [name]4, T3FREE, THYROIDAB in the last 72 hours. Anemia Panel: No results for input(s): VITAMINB12, FOLATE, FERRITIN, TIBC, IRON, RETICCTPCT in the last 72 hours. Sepsis Labs: No results for input(s): PROCALCITON, LATICACIDVEN in the last 168 hours.  Recent Results (from the past 240 hour(s))  Urine culture     Status: Abnormal   Collection Time: 02/07/19  9:29 PM   Specimen: Urine, Random  Result Value Ref Range Status   Specimen Description   Final    URINE, RANDOM Performed at [facility], [address] [place], [place] [address]    Special Requests   Final    NONE Performed at [facility], [address] [place], [place] [address]    Culture >=100,000 COLONIES/mL ESCHERICHIA COLI (A)  Final   Report Status 02/10/2019 FINAL  Final   Organism ID, Bacteria ESCHERICHIA COLI (A)  Final      Susceptibility   Escherichia coli - MIC*    AMPICILLIN 4 SENSITIVE Sensitive     CEFAZOLIN <=4 SENSITIVE Sensitive     CEFTRIAXONE <=1 SENSITIVE Sensitive     CIPROFLOXACIN <=0.25 SENSITIVE Sensitive     GENTAMICIN <=1 SENSITIVE Sensitive     IMIPENEM <=0.25 SENSITIVE Sensitive     NITROFURANTOIN <=16 SENSITIVE Sensitive     TRIMETH/SULFA <=20 SENSITIVE Sensitive     AMPICILLIN/SULBACTAM <=2 SENSITIVE Sensitive     PIP/TAZO <=4 SENSITIVE Sensitive     Extended ESBL NEGATIVE Sensitive     * >=100,000 COLONIES/mL ESCHERICHIA COLI  Blood culture (routine x 2)     Status: Abnormal   Collection Time: 02/08/19  1:33 AM   Specimen: BLOOD  Result Value Ref Range Status   Specimen Description   Final    BLOOD LEFT ANTECUBITAL Performed at [facility], [address] [place], [place] [address]    Special Requests   Final    BOTTLES DRAWN  AEROBIC AND ANAEROBIC Blood Culture adequate volume Performed at [facility], [address] [place], [place] [address]    Culture  Setup Time   Final    GRAM NEGATIVE RODS AEROBIC BOTTLE ONLY CRITICAL RESULT CALLED TO, READ BACK BY AND VERIFIED WITH: JASON ROBBINS AT 1456 02/08/2019.PMF Performed at [facility], [address] [place], [place] [address]    Culture (A)  Final    ESCHERICHIA COLI SUSCEPTIBILITIES PERFORMED ON PREVIOUS CULTURE WITHIN THE LAST 5 DAYS. Performed at [facility], [place]  [address], [place], [place] [address]    Report Status 02/10/2019 FINAL  Final  Blood culture (routine x 2)     Status: Abnormal   Collection Time: 02/08/19  1:33 AM   Specimen: BLOOD  Result Value Ref Range Status   Specimen Description   Final    BLOOD BLOOD LEFT HAND Performed at [facility], [address], [place], [place] [address]    Special Requests   Final    BOTTLES DRAWN AEROBIC AND ANAEROBIC Blood Culture adequate volume Performed at [facility], [address] [place], [place] [address]    Culture  Setup Time   Final    GRAM NEGATIVE RODS IN BOTH AEROBIC AND ANAEROBIC BOTTLES CRITICAL RESULT CALLED TO, READ BACK BY AND VERIFIED WITH: JASON ROBBINS AT 1456 02/08/2019.PMF Performed at [facility], [place] [address] [place], [place] [address]    Culture ESCHERICHIA COLI (A)  Final   Report Status 02/10/2019 FINAL  Final   Organism ID, Bacteria ESCHERICHIA COLI  Final      Susceptibility   Escherichia coli - MIC*    AMPICILLIN 4 SENSITIVE Sensitive     CEFAZOLIN <=4 SENSITIVE Sensitive     CEFEPIME <=1 SENSITIVE Sensitive     CEFTAZIDIME <=1 SENSITIVE Sensitive     CEFTRIAXONE <=1 SENSITIVE Sensitive     CIPROFLOXACIN <=0.25 SENSITIVE Sensitive     GENTAMICIN <=1 SENSITIVE Sensitive     IMIPENEM <=0.25 SENSITIVE Sensitive     TRIMETH/SULFA <=20 SENSITIVE Sensitive     AMPICILLIN/SULBACTAM <=2 SENSITIVE Sensitive      PIP/TAZO <=4 SENSITIVE Sensitive     Extended ESBL NEGATIVE Sensitive     * ESCHERICHIA COLI  Blood Culture ID Panel (Reflexed)     Status: Abnormal   Collection Time: 02/08/19  1:33 AM  Result Value Ref Range Status   Enterococcus species NOT DETECTED NOT DETECTED Final   Listeria monocytogenes NOT DETECTED NOT DETECTED Final   Staphylococcus species NOT DETECTED NOT DETECTED Final   Staphylococcus aureus (BCID) NOT DETECTED NOT DETECTED Final   Streptococcus species NOT DETECTED NOT DETECTED Final   Streptococcus agalactiae NOT DETECTED NOT DETECTED Final   Streptococcus pneumoniae NOT DETECTED NOT DETECTED Final   Streptococcus pyogenes NOT DETECTED NOT DETECTED Final   Acinetobacter baumannii NOT DETECTED NOT DETECTED Final   Enterobacteriaceae species DETECTED (A) NOT DETECTED Final    Comment: Enterobacteriaceae represent a large family of gram-negative bacteria, not a single organism. CRITICAL RESULT CALLED TO, READ BACK BY AND VERIFIED WITH: JASON ROBBINS AT 2694 02/08/2019.PMF    Enterobacter cloacae complex NOT DETECTED NOT DETECTED Final   Escherichia coli DETECTED (A) NOT DETECTED Final    Comment: CRITICAL RESULT CALLED TO, READ BACK BY AND VERIFIED WITH: JASON ROBBINS AT 8546 02/08/2019.PMF    Klebsiella oxytoca NOT DETECTED NOT DETECTED Final   Klebsiella pneumoniae NOT DETECTED NOT DETECTED Final   Proteus species NOT DETECTED NOT DETECTED Final   Serratia marcescens NOT DETECTED NOT DETECTED Final   Carbapenem resistance NOT DETECTED NOT DETECTED Final   Haemophilus influenzae NOT DETECTED NOT DETECTED Final   Neisseria meningitidis NOT DETECTED NOT DETECTED Final   Pseudomonas aeruginosa NOT DETECTED NOT DETECTED Final   Candida albicans NOT DETECTED NOT DETECTED Final   Candida glabrata NOT DETECTED NOT DETECTED Final   Candida krusei NOT DETECTED NOT DETECTED Final   Candida parapsilosis NOT DETECTED NOT DETECTED Final   Candida tropicalis NOT DETECTED NOT  DETECTED Final  Comment: Performed at [facility], [place], [place] [address]  SARS CORONAVIRUS 2 (TAT 6-24 HRS) Nasopharyngeal Nasopharyngeal Swab     Status: None   Collection Time: 02/08/19  2:38 AM   Specimen: Nasopharyngeal Swab  Result Value Ref Range Status   SARS Coronavirus 2 NEGATIVE NEGATIVE Final    Comment: (NOTE) SARS-CoV-2 target nucleic acids are NOT DETECTED. The SARS-CoV-2 RNA is generally detectable in upper and lower respiratory specimens during the acute phase of infection. Negative results do not preclude SARS-CoV-2 infection, do not rule out co-infections with other pathogens, and should not be used as the sole basis for treatment or other patient management decisions. Negative results must be combined with clinical observations, patient history, and epidemiological information. The expected result is Negative. Fact Sheet for Patients: SugarRoll.be Fact Sheet for Healthcare Providers: https://www.woods-mathews.com/ This test is not yet approved or cleared by the [place] FDA and  has been authorized for detection and/or diagnosis of SARS-CoV-2 by FDA under an Emergency Use Authorization (EUA). This EUA will remain  in effect (meaning this test can be used) for the duration of the COVID-19 declaration under Section 56 4(b)(1) of the Act, 21 U.S.C. section 360bbb-3(b)(1), unless the authorization is terminated or revoked sooner. Performed at [facility], [place] [address] [place], [place] [address]   MRSA PCR Screening     Status: None   Collection Time: 02/09/19 12:51 AM   Specimen: Nasopharyngeal  Result Value Ref Range Status   MRSA by PCR NEGATIVE NEGATIVE Final    Comment:        The GeneXpert MRSA Assay (FDA approved for NASAL specimens only), is one component of a comprehensive MRSA colonization surveillance program. It is not intended to diagnose MRSA infection nor  to guide or monitor treatment for MRSA infections. Performed at [facility], [address], [place],  [address]          Radiology Studies: Dg Chest 2 View  Result Date: 02/12/2019 CLINICAL DATA:  Dyspnea EXAM: CHEST - 2 VIEW COMPARISON:  09/27/2015 FINDINGS: Postop CABG with cardiac enlargement unchanged. Negative for edema. Small left effusion. Mild atelectasis in the lung bases. IMPRESSION: Mild bibasilar atelectasis. Small left effusion. Negative for pulmonary edema. Electronically Signed   By: Franchot Gallo M.D.   On: 02/12/2019 15:12        Scheduled Meds: . apixaban  5 mg Oral BID  . collagenase   Topical Daily  . diclofenac sodium  1 application Topical TID  . furosemide  10 mg Oral Daily  . insulin aspart  0-5 Units Subcutaneous QHS  . insulin aspart  0-9 Units Subcutaneous TID WC  . metoprolol tartrate  75 mg Oral BID  . multivitamin with minerals  1 tablet Oral Daily  . nystatin cream  1 application Topical BID  . pantoprazole  40 mg Oral BID  . pravastatin  20 mg Oral Daily  . ramipril  5 mg Oral Daily  . traZODone  150 mg Oral QHS  . vitamin B-12  1,000 mcg Oral Daily  . vitamin E  400 Units Oral Daily   Continuous Infusions: . sodium chloride 250 mL (02/13/19 0536)  . ampicillin-sulbactam (UNASYN) IV 3 g (02/13/19 1228)     LOS: 5 days    Time spent: 40 minutes    Irine Seal, MD [facility]  If 7PM-7AM, please contact night-coverage [email or web address] 02/13/2019, 3:56 PM

## 2019-02-13 NOTE — Progress Notes (Signed)
Physical Therapy Treatment Patient Details Name: [name] MRN: [id number] DOB: Oct 01, 1927 Today's Date: 02/13/2019    History of Present Illness [name]  is a 85 y.o. Caucasian female with a known history of multiple medical problems that will be mentioned below, including type 2 diabetes mellitus, hypertension, diverticulosis and depression who presented to the emergency room with acute onset of abdominal pain described as sharp and extending from epigastric area to her mid abdomen and right upper quadrant with no nausea or vomiting or fever or chills.    PT Comments    Pt in chair upon entry, agreeable to participate. Pt assisted with diaper donning as pt has high volume gravity-assisted urinary incontinence which each standing attempt. Pt rises to standing without any physical assistance. Pt able to AMB 12ft alternating direction with RW on room air. Pt demonstrates safe use of RW. Extensive cues required for safety for return to sitting. Pt maintain on supplemental O2 throughout without SOB limitations in gait, however metabolic level of exertion during AMB is fairly low.    Follow Up Recommendations  Home health PT     Equipment Recommendations  3in1 (PT)    Recommendations for Other Services       Precautions / Restrictions Precautions Precautions: Fall Restrictions Weight Bearing Restrictions: No    Mobility  Bed Mobility               General bed mobility comments: received up in chair  Transfers Overall transfer level: Needs assistance Equipment used: Rolling walker (2 wheeled) Transfers: Sit to/from Stand Sit to Stand: Min guard;Supervision         General transfer comment: 3x from recliner with RW  Ambulation/Gait Ambulation/Gait assistance: Supervision;Min guard Gait Distance (Feet): 54 Feet(alternating FWD/BWD for gait training/balance training) Assistive device: Rolling walker (2 wheeled) Gait Pattern/deviations: Step-to pattern      General Gait Details: short steps. very slow, remain steady.   Stairs             Wheelchair Mobility    Modified Rankin (Stroke Patients Only)       Balance           Standing balance support: Bilateral upper extremity supported;During functional activity Standing balance-Leahy Scale: Fair                              Cognition Arousal/Alertness: Awake/alert Behavior During Therapy: WFL for tasks assessed/performed;Anxious Overall Cognitive Status: Within Functional Limits for tasks assessed                                        Exercises      General Comments        Pertinent Vitals/Pain Pain Assessment: No/denies pain    Home Living                      Prior Function            PT Goals (current goals can now be found in the care plan section) Acute Rehab PT Goals Patient Stated Goal: to walk PT Goal Formulation: Patient unable to participate in goal setting Time For Goal Achievement: 02/23/19 Potential to Achieve Goals: Good Progress towards PT goals: Progressing toward goals    Frequency    Min 2X/week      PT Plan Current plan remains  appropriate    Co-evaluation              AM-PAC PT "6 Clicks" Mobility   Outcome Measure  Help needed turning from your back to your side while in a flat bed without using bedrails?: A Ferraris Help needed moving from lying on your back to sitting on the side of a flat bed without using bedrails?: A Duarte Help needed moving to and from a bed to a chair (including a wheelchair)?: A Napp Help needed standing up from a chair using your arms (e.g., wheelchair or bedside chair)?: A Rosenkranz Help needed to walk in hospital room?: A [place]way Help needed climbing 3-5 steps with a railing? : A Lot 6 Click Score: 17    End of Session Equipment Utilized During Treatment: Gait belt;Oxygen(diaper) Activity Tolerance: Patient tolerated treatment well Patient left:  in chair;with chair alarm set;with call bell/phone within reach;with nursing/sitter in room Nurse Communication: Mobility status PT Visit Diagnosis: Muscle weakness (generalized) (M62.81);Difficulty in walking, not elsewhere classified (R26.2)     Time: 5072-2575 PT Time Calculation (min) (ACUTE ONLY): 35 min  Charges:  $Gait Training: 8-22 mins $Therapeutic Exercise: 8-22 mins                     12:43 PM, 02/13/19 [name], PT, DPT Physical Therapist - [facility]  [phone] ([place])    [name] C 02/13/2019, 12:40 PM

## 2019-02-13 NOTE — Progress Notes (Signed)
Patient ambulated to [facility], on room air, O2 saturations dropping to 78%. Patient given PRN breathing treatment and placed back on 3 L O2 via [place], with O2 sats maintaining 94-97%.

## 2019-02-14 ENCOUNTER — Encounter: Payer: Self-pay | Admitting: Radiology

## 2019-02-14 ENCOUNTER — Inpatient Hospital Stay: Payer: Medicare Other

## 2019-02-14 DIAGNOSIS — E1149 Type 2 diabetes mellitus with other diabetic neurological complication: Secondary | ICD-10-CM

## 2019-02-14 DIAGNOSIS — B9620 Unspecified Escherichia coli [E. coli] as the cause of diseases classified elsewhere: Secondary | ICD-10-CM

## 2019-02-14 DIAGNOSIS — E1151 Type 2 diabetes mellitus with diabetic peripheral angiopathy without gangrene: Secondary | ICD-10-CM

## 2019-02-14 LAB — GLUCOSE, CAPILLARY
Glucose-Capillary: 139 mg/dL — ABNORMAL HIGH (ref 70–99)
Glucose-Capillary: 182 mg/dL — ABNORMAL HIGH (ref 70–99)
Glucose-Capillary: 163 mg/dL — ABNORMAL HIGH (ref 70–99)
Glucose-Capillary: 259 mg/dL — ABNORMAL HIGH (ref 70–99)

## 2019-02-14 LAB — BASIC METABOLIC PANEL
Anion gap: 8 (ref 5–15)
BUN: 23 mg/dL (ref 8–23)
CO2: 35 mmol/L — ABNORMAL HIGH (ref 22–32)
Calcium: 8.7 mg/dL — ABNORMAL LOW (ref 8.9–10.3)
Chloride: 97 mmol/L — ABNORMAL LOW (ref 98–111)
Creatinine, Ser: 0.62 mg/dL (ref 0.44–1.00)
GFR calc Af Amer: >60 mL/min (ref >60)
GFR calc non Af Amer: >60 mL/min (ref >60)
Glucose, Bld: 148 mg/dL — ABNORMAL HIGH (ref 70–99)
Potassium: 3.9 mmol/L (ref 3.5–5.1)
Sodium: 140 mmol/L (ref 135–145)

## 2019-02-14 LAB — CBC
HCT: 36 % (ref 36.0–46.0)
Hemoglobin: 11.5 g/dL — ABNORMAL LOW (ref 12.0–15.0)
MCH: 29.6 pg (ref 26.0–34.0)
MCHC: 31.9 g/dL (ref 30.0–36.0)
MCV: 92.8 fL (ref 80.0–100.0)
Platelets: 238 10*3/uL (ref 150–400)
RBC: 3.88 MIL/uL (ref 3.87–5.11)
RDW: 14 % (ref 11.5–15.5)
WBC: 8.4 10*3/uL (ref 4.0–10.5)
nRBC: 0 % (ref 0.0–0.2)

## 2019-02-14 LAB — BRAIN NATRIURETIC PEPTIDE: B Natriuretic Peptide: 323 pg/mL — ABNORMAL HIGH (ref 0.0–100.0)

## 2019-02-14 MED ORDER — METOPROLOL TARTRATE 50 MG PO TABS
100.0000 mg | ORAL_TABLET | Freq: Two times a day (BID) | ORAL | Status: DC
  Administered 2019-02-14 – 2019-02-20 (×11): 100 mg via ORAL
  Filled 2019-02-14 (×12): qty 2

## 2019-02-14 MED ORDER — IOHEXOL 350 MG/ML SOLN
75.0000 mL | Freq: Once | INTRAVENOUS | Status: AC | PRN
  Administered 2019-02-14: 75 mL via INTRAVENOUS

## 2019-02-14 MED ORDER — FUROSEMIDE 10 MG/ML IJ SOLN
20.0000 mg | Freq: Two times a day (BID) | INTRAMUSCULAR | Status: DC
  Administered 2019-02-14 – 2019-02-15 (×2): 20 mg via INTRAVENOUS
  Filled 2019-02-14 (×2): qty 2

## 2019-02-14 MED ORDER — AMOXICILLIN-POT CLAVULANATE 875-125 MG PO TABS
1.0000 | ORAL_TABLET | Freq: Two times a day (BID) | ORAL | Status: DC
  Administered 2019-02-14 – 2019-02-20 (×13): 1 via ORAL
  Filled 2019-02-14 (×13): qty 1

## 2019-02-14 MED ORDER — METOPROLOL TARTRATE 25 MG PO TABS
25.0000 mg | ORAL_TABLET | Freq: Once | ORAL | Status: AC
  Administered 2019-02-14: 25 mg via ORAL
  Filled 2019-02-14: qty 1

## 2019-02-14 NOTE — Care Management Important Message (Signed)
Important Message  Patient Details  Name: [name] MRN: [id number] Date of Birth: 1927-09-10   Medicare Important Message Given:  Yes     Dannette Barbara 02/14/2019, 1:37 PM

## 2019-02-14 NOTE — Progress Notes (Signed)
PROGRESS NOTE    [name]  QQI:[id number] DOB: 07-23-1927 DOA: 02/07/2019 PCP: Ria Bush, MD    Brief Narrative:  Patient is an 85 year old Caucasian female with PMH of HTN, HLD, DM2, CAD s/p CABG, diastolic CHF, PAD, DVT, chronic venous insufficiency, diverticulosis, hemorrhoids, cystocele, depression. She presented to the ED on 11/5 with complaint of acute abdominal pain extending from epigastric area to her mid abdomen and right upper quadrantwith no nausea or vomiting or fever or chills.   In the ED, patient was afebrile, hemodynamically stable. Work-up showed WBC count elevated to 25.7. Urinalysis showed cloudy amber-colored urine with moderate amount of leukocytes, many bacteria and WBC clumps. CT abdomen pelvis showed fluid-filled outpouching arising from the first portion of the duodenum with some irregular mural thickening and adjacent inflammation, concerning for contained perforation of a duodenal ulcer or inflamed duodenal diverticulum.  Patient was admitted to hospitalist service for further evaluation and management.  Urine culture and Blood culture obtained on admission both showed more than 100,000 CFU per mL of E. coli, pansensitive.   Assessment & Plan:   Principal Problem:   E coli bacteremia Active Problems:   Diverticulitis of duodenum   Coronary artery disease   Hypertension   Hyperlipidemia   Type 2 diabetes mellitus with neurological manifestations, controlled ([place])   Diabetes mellitus type 2 with peripheral artery disease (HCC)   Paroxysmal atrial fibrillation (HCC)   Bacteremia due to Gram-negative bacteria   Dyspnea   Acute respiratory failure with hypoxia (HCC)   AKI (acute kidney injury) (HCC)   Chronic diastolic CHF (congestive heart failure) (HCC)   E. coli UTI   1 E. coli bacteremia/E. coli UTI Patient initially had presented with abdominal pain.  Patient was pancultured and urine cultures and blood cultures positive for E.  coli which was pansensitive.  Patient initially was on IV Zosyn and subsequently IV Rocephin and has been now on IV Unasyn.  Discontinue IV Unasyn and transition to Augmentin to complete a 10-day course of antibiotic treatment.  Follow.   2.  Duodenal diverticulitis Patient seen in consultation by GI and general surgery who recommended medical management with IV antibiotics.  Patient currently on IV Unasyn.  Discontinue IV Unasyn and transition to oral Augmentin to complete a 10-day course of antibiotic treatment.  Per general surgery no surgical intervention required at this time.  Appreciate GI input and recommendations.  3.  Acute respiratory failure with hypoxia Patient noted not to be on oxygen prior to admission.  Chest x-ray done negative for any acute infiltrates.  Patient noted to have some coarse breath sounds/bibasilar crackles on 02/13/2019.Marland Kitchen  Patient was resumed on home dose oral Lasix at 10 mg daily.  Urine output not properly recorded.  Patient's current weight of 155 pounds from 143.96 pounds on admission.  Patient received a dose of Lasix 20 mg IV x1 on 02/13/2019.  Patient still hypoxic.  Will place on Lasix 20 mg IV every 12 hours x4 doses.  Check a BNP.  Check a CT angio of chest for further evaluation.  Repeat ambulatory sats tomorrow.  Supportive care.   4.  Acute kidney injury Creatinine noted to be elevated at 1.6 on day of presentation.  Patient placed on IV fluids with hydration.  Acute kidney injury resolved.  Monitor with diuretics.  5.  Paroxysmal atrial fibrillation with RVR Patient with a history of A. fib on oral metoprolol 37.5 mg twice daily as well as Eliquis for anticoagulation.  On hospital  day 2 patient noted to be in RVR failure likely secondary to acute infection of bacteremia.  Patient initially placed on a Cardizem drip which has subsequently been weaned off.  Patient currently on increased dose of metoprolol 75 mg twice daily.  Will increase metoprolol to 100 mg  twice daily.  Continue Eliquis for anticoagulation.  Outpatient follow-up.  6.  Chronic diastolic CHF/coronary artery disease status post CABG/PAD/hypertension/hyperlipidemia Currently stable.  Continue increased dose of metoprolol, ramipril, Lasix, statin and Eliquis.  Patient currently on home dose oral Lasix at 10 mg daily.  Patient still with some hypoxia and trace to 1+ lower extremity edema.  Check a BNP.  Will place on Lasix 20 mg IV every 12 hours x4 doses and monitor urine output closely and daily weights. May need a higher dose of Lasix on discharge however follow for now.  7.  Well-controlled type 2 diabetes mellitus Hemoglobin A1c 6.9 (02/08/2019).  Patient noted to be on Actos prior to admission.  CBG of 139 this morning.  Continue to hold oral hypoglycemic agents.  Continue sliding scale insulin.   DVT prophylaxis: Eliquis Code Status: Full Family Communication: Updated patient.  No family at bedside. Disposition Plan: Likely home when clinically improved, improvement with hypoxia likely in the next 24 to 48 hours.   Consultants:   General surgery: Dr. Lysle Pearl 02/08/2019  Gastroenterology: Dr. Alice Reichert 02/08/2019  Procedures:   CT abdomen and pelvis 02/07/2019  Chest x-ray 02/12/2019  2D echo 02/10/2019  Antimicrobials:   IV Unasyn 02/12/2019>>>> 02/14/2019  IV Rocephin 02/08/2019>>>> 02/11/2019  IV Merrem 02/08/2019>>>> 02/09/2019  IV Zosyn 02/07/2019>>>>> 02/08/2019   Augmentin 02/14/2019   Subjective: Patient sitting up in chair.  States she is feeling better today than she did yesterday.  Denies any chest pain.  Ambulatory O2 sats about to be checked per RN.   Objective: Vitals:   02/14/19 0301 02/14/19 0311 02/14/19 0828 02/14/19 0944  BP: (!) 131/93 129/72 130/88   Pulse: (!) 108 91 (!) 140 (!) 104  Resp: 16  20   Temp: 98.8 F (37.1 C)  98 F (36.7 C)   TempSrc: Oral     SpO2: 98%  97%   Weight:      Height:        Intake/Output Summary (Last 24  hours) at 02/14/2019 1151 Last data filed at 02/14/2019 0900 Gross per 24 hour  Intake 240 ml  Output 150 ml  Net 90 ml   Filed Weights   02/09/19 1742 02/10/19 0430 02/13/19 0452  Weight: 52.7 kg 69.1 kg 70.5 kg    Examination:  General exam: NAD Respiratory system: Decreased breath sounds in the bases.  No wheezing.  Fair air movement.  Speaking in full sentences.  Cardiovascular system: Regular rate and rhythm no murmurs rubs or gallops.  No JVD.  Trace to 1+ bilateral lower extremity edema.  Gastrointestinal system: Abdomen is soft, nontender, nondistended, positive bowel sounds.  No rebound.  No guarding.  Central nervous system: Alert and oriented. No focal neurological deficits. Extremities: Symmetric 5 x 5 power. Skin: No rashes, lesions or ulcers Psychiatry: Judgement and insight appear normal. Mood & affect appropriate.     Data Reviewed: I have personally reviewed following labs and imaging studies  CBC: Recent Labs  Lab 02/07/19 1639 02/09/19 0529 02/10/19 0523 02/11/19 0433 02/14/19 0438  WBC 25.7* 11.9* 11.4* 10.8* 8.4  NEUTROABS  --  10.0* 9.8* 8.3*  --   HGB 12.4 12.1 11.6* 11.6* 11.5*  HCT  39.7 38.9 36.5 36.4 36.0  MCV 95.9 96.5 93.4 94.1 92.8  PLT 186 171 154 172 858   Basic Metabolic Panel: Recent Labs  Lab 02/09/19 0529 02/10/19 0523 02/11/19 0433 02/13/19 0923 02/14/19 0438  NA 141 138 138 139 140  K 3.9 4.1 4.2 4.1 3.9  CL 109 109 107 100 97*  CO2 24 23 24 30  35*  GLUCOSE 144* 177* 172* 173* 148*  BUN 23 19 28* 19 23  CREATININE 0.77 0.67 0.90 0.61 0.62  CALCIUM 8.3* 8.2* 8.5* 8.9 8.7*   GFR: Estimated Creatinine Clearance: 38.7 mL/min (by C-G formula based on SCr of 0.62 mg/dL). Liver Function Tests: Recent Labs  Lab 02/07/19 1639  AST 26  ALT 18  ALKPHOS 94  BILITOT 0.8  PROT 6.7  ALBUMIN 3.6   Recent Labs  Lab 02/07/19 1639  LIPASE 23   No results for input(s): AMMONIA in the last 168 hours. Coagulation  Profile: No results for input(s): INR, PROTIME in the last 168 hours. Cardiac Enzymes: No results for input(s): CKTOTAL, CKMB, CKMBINDEX, TROPONINI in the last 168 hours. BNP (last 3 results) No results for input(s): PROBNP in the last 8760 hours. HbA1C: No results for input(s): HGBA1C in the last 72 hours. CBG: Recent Labs  Lab 02/13/19 0724 02/13/19 1207 02/13/19 1646 02/13/19 2104 02/14/19 0823  GLUCAP 149* 250* 183* 187* 139*   Lipid Profile: No results for input(s): CHOL, HDL, LDLCALC, TRIG, CHOLHDL, LDLDIRECT in the last 72 hours. Thyroid Function Tests: No results for input(s): TSH, T4TOTAL, [name]4, T3FREE, THYROIDAB in the last 72 hours. Anemia Panel: No results for input(s): VITAMINB12, FOLATE, FERRITIN, TIBC, IRON, RETICCTPCT in the last 72 hours. Sepsis Labs: No results for input(s): PROCALCITON, LATICACIDVEN in the last 168 hours.  Recent Results (from the past 240 hour(s))  Urine culture     Status: Abnormal   Collection Time: 02/07/19  9:29 PM   Specimen: Urine, Random  Result Value Ref Range Status   Specimen Description   Final    URINE, RANDOM Performed at [facility], [address] [place], [place] [address]    Special Requests   Final    NONE Performed at [facility], [address] [place], [place] [address]    Culture >=100,000 COLONIES/mL ESCHERICHIA COLI (A)  Final   Report Status 02/10/2019 FINAL  Final   Organism ID, Bacteria ESCHERICHIA COLI (A)  Final      Susceptibility   Escherichia coli - MIC*    AMPICILLIN 4 SENSITIVE Sensitive     CEFAZOLIN <=4 SENSITIVE Sensitive     CEFTRIAXONE <=1 SENSITIVE Sensitive     CIPROFLOXACIN <=0.25 SENSITIVE Sensitive     GENTAMICIN <=1 SENSITIVE Sensitive     IMIPENEM <=0.25 SENSITIVE Sensitive     NITROFURANTOIN <=16 SENSITIVE Sensitive     TRIMETH/SULFA <=20 SENSITIVE Sensitive     AMPICILLIN/SULBACTAM <=2 SENSITIVE Sensitive     PIP/TAZO <=4 SENSITIVE Sensitive      Extended ESBL NEGATIVE Sensitive     * >=100,000 COLONIES/mL ESCHERICHIA COLI  Blood culture (routine x 2)     Status: Abnormal   Collection Time: 02/08/19  1:33 AM   Specimen: BLOOD  Result Value Ref Range Status   Specimen Description   Final    BLOOD LEFT ANTECUBITAL Performed at [facility], [address] [place], [place] [address]    Special Requests   Final    BOTTLES DRAWN AEROBIC AND ANAEROBIC Blood Culture adequate volume  Performed at [facility], [address] [place], [place] [address]    Culture  Setup Time   Final    GRAM NEGATIVE RODS AEROBIC BOTTLE ONLY CRITICAL RESULT CALLED TO, READ BACK BY AND VERIFIED WITH: JASON ROBBINS AT 1062 02/08/2019.PMF Performed at [facility], [address] [place], [place] [address]    Culture (A)  Final    ESCHERICHIA COLI SUSCEPTIBILITIES PERFORMED ON PREVIOUS CULTURE WITHIN THE LAST 5 DAYS. Performed at [facility], [place] [address], [place], [place] [address]    Report Status 02/10/2019 FINAL  Final  Blood culture (routine x 2)     Status: Abnormal   Collection Time: 02/08/19  1:33 AM   Specimen: BLOOD  Result Value Ref Range Status   Specimen Description   Final    BLOOD BLOOD LEFT HAND Performed at [facility], [address], [place], [place] [address]    Special Requests   Final    BOTTLES DRAWN AEROBIC AND ANAEROBIC Blood Culture adequate volume Performed at [facility], [address] [place], [place] [address]    Culture  Setup Time   Final    GRAM NEGATIVE RODS IN BOTH AEROBIC AND ANAEROBIC BOTTLES CRITICAL RESULT CALLED TO, READ BACK BY AND VERIFIED WITH: JASON ROBBINS AT 1456 02/08/2019.PMF Performed at [facility], [place] [address] [place], [place] [address]    Culture ESCHERICHIA COLI (A)  Final   Report Status 02/10/2019 FINAL  Final   Organism ID, Bacteria ESCHERICHIA COLI  Final      Susceptibility   Escherichia coli - MIC*     AMPICILLIN 4 SENSITIVE Sensitive     CEFAZOLIN <=4 SENSITIVE Sensitive     CEFEPIME <=1 SENSITIVE Sensitive     CEFTAZIDIME <=1 SENSITIVE Sensitive     CEFTRIAXONE <=1 SENSITIVE Sensitive     CIPROFLOXACIN <=0.25 SENSITIVE Sensitive     GENTAMICIN <=1 SENSITIVE Sensitive     IMIPENEM <=0.25 SENSITIVE Sensitive     TRIMETH/SULFA <=20 SENSITIVE Sensitive     AMPICILLIN/SULBACTAM <=2 SENSITIVE Sensitive     PIP/TAZO <=4 SENSITIVE Sensitive     Extended ESBL NEGATIVE Sensitive     * ESCHERICHIA COLI  Blood Culture ID Panel (Reflexed)     Status: Abnormal   Collection Time: 02/08/19  1:33 AM  Result Value Ref Range Status   Enterococcus species NOT DETECTED NOT DETECTED Final   Listeria monocytogenes NOT DETECTED NOT DETECTED Final   Staphylococcus species NOT DETECTED NOT DETECTED Final   Staphylococcus aureus (BCID) NOT DETECTED NOT DETECTED Final   Streptococcus species NOT DETECTED NOT DETECTED Final   Streptococcus agalactiae NOT DETECTED NOT DETECTED Final   Streptococcus pneumoniae NOT DETECTED NOT DETECTED Final   Streptococcus pyogenes NOT DETECTED NOT DETECTED Final   Acinetobacter baumannii NOT DETECTED NOT DETECTED Final   Enterobacteriaceae species DETECTED (A) NOT DETECTED Final    Comment: Enterobacteriaceae represent a large family of gram-negative bacteria, not a single organism. CRITICAL RESULT CALLED TO, READ BACK BY AND VERIFIED WITH: JASON ROBBINS AT 6967 02/08/2019.PMF    Enterobacter cloacae complex NOT DETECTED NOT DETECTED Final   Escherichia coli DETECTED (A) NOT DETECTED Final    Comment: CRITICAL RESULT CALLED TO, READ BACK BY AND VERIFIED WITH: JASON ROBBINS AT 8938 02/08/2019.PMF    Klebsiella oxytoca NOT DETECTED NOT DETECTED Final   Klebsiella pneumoniae NOT DETECTED NOT DETECTED Final   Proteus species NOT DETECTED NOT DETECTED Final   Serratia marcescens  NOT DETECTED NOT DETECTED Final   Carbapenem resistance NOT DETECTED NOT DETECTED Final    Haemophilus influenzae NOT DETECTED NOT DETECTED Final   Neisseria meningitidis NOT DETECTED NOT DETECTED Final   Pseudomonas aeruginosa NOT DETECTED NOT DETECTED Final   Candida albicans NOT DETECTED NOT DETECTED Final   Candida glabrata NOT DETECTED NOT DETECTED Final   Candida krusei NOT DETECTED NOT DETECTED Final   Candida parapsilosis NOT DETECTED NOT DETECTED Final   Candida tropicalis NOT DETECTED NOT DETECTED Final    Comment: Performed at [facility], [place], [place] [address]  SARS CORONAVIRUS 2 (TAT 6-24 HRS) Nasopharyngeal Nasopharyngeal Swab     Status: None   Collection Time: 02/08/19  2:38 AM   Specimen: Nasopharyngeal Swab  Result Value Ref Range Status   SARS Coronavirus 2 NEGATIVE NEGATIVE Final    Comment: (NOTE) SARS-CoV-2 target nucleic acids are NOT DETECTED. The SARS-CoV-2 RNA is generally detectable in upper and lower respiratory specimens during the acute phase of infection. Negative results do not preclude SARS-CoV-2 infection, do not rule out co-infections with other pathogens, and should not be used as the sole basis for treatment or other patient management decisions. Negative results must be combined with clinical observations, patient history, and epidemiological information. The expected result is Negative. Fact Sheet for Patients: SugarRoll.be Fact Sheet for Healthcare Providers: https://www.woods-mathews.com/ This test is not yet approved or cleared by the [place] FDA and  has been authorized for detection and/or diagnosis of SARS-CoV-2 by FDA under an Emergency Use Authorization (EUA). This EUA will remain  in effect (meaning this test can be used) for the duration of the COVID-19 declaration under Section 56 4(b)(1) of the Act, 21 U.S.C. section 360bbb-3(b)(1), unless the authorization is terminated or revoked sooner. Performed at [facility], [place] [address]  [place], [place] [address]   MRSA PCR Screening     Status: None   Collection Time: 02/09/19 12:51 AM   Specimen: Nasopharyngeal  Result Value Ref Range Status   MRSA by PCR NEGATIVE NEGATIVE Final    Comment:        The GeneXpert MRSA Assay (FDA approved for NASAL specimens only), is one component of a comprehensive MRSA colonization surveillance program. It is not intended to diagnose MRSA infection nor to guide or monitor treatment for MRSA infections. Performed at [facility], [address], [place], [place] [address]          Radiology Studies: Dg Chest 2 View  Result Date: 02/12/2019 CLINICAL DATA:  Dyspnea EXAM: CHEST - 2 VIEW COMPARISON:  09/27/2015 FINDINGS: Postop CABG with cardiac enlargement unchanged. Negative for edema. Small left effusion. Mild atelectasis in the lung bases. IMPRESSION: Mild bibasilar atelectasis. Small left effusion. Negative for pulmonary edema. Electronically Signed   By: Franchot Gallo M.D.   On: 02/12/2019 15:12        Scheduled Meds:  amoxicillin-clavulanate  1 tablet Oral Q12H   apixaban  5 mg Oral BID   collagenase   Topical Daily   diclofenac sodium  1 application Topical TID   furosemide  20 mg Intravenous Q12H   furosemide  10 mg Oral Daily   insulin aspart  0-5 Units Subcutaneous QHS   insulin aspart  0-9 Units Subcutaneous TID WC   metoprolol tartrate  75 mg Oral BID   multivitamin with minerals  1 tablet Oral Daily   nystatin cream  1 application Topical BID   pantoprazole  40  mg Oral BID   pravastatin  20 mg Oral Daily   ramipril  5 mg Oral Daily   traZODone  150 mg Oral QHS   vitamin B-12  1,000 mcg Oral Daily   vitamin E  400 Units Oral Daily   Continuous Infusions:  sodium chloride 250 mL (02/13/19 2313)     LOS: 6 days    Time spent: 40 minutes    Irine Seal, MD [facility]  If 7PM-7AM, please contact night-coverage [email or web address] 02/14/2019, 11:51 AM

## 2019-02-14 NOTE — Plan of Care (Signed)
  Problem: Clinical Measurements: Goal: Ability to maintain clinical measurements within normal limits will improve Outcome: Progressing Goal: Respiratory complications will improve Outcome: Progressing Goal: Cardiovascular complication will be avoided Outcome: Progressing   Problem: Activity: Goal: Risk for activity intolerance will decrease Outcome: Progressing   Problem: Nutrition: Goal: Adequate nutrition will be maintained Outcome: Progressing

## 2019-02-14 NOTE — Progress Notes (Signed)
Pts O2 sats drop to 84% on RA while at rest. Pts O2 stats up to 92-93% while ambulating on 2L [place] and 95% at rest on 2L [place].

## 2019-02-14 NOTE — TOC Progression Note (Signed)
Transition of Care [facility]) - Progression Note    Patient Details  Name: [name] MRN: [id number] Date of Birth: 06/25/1927  [facility] CM/SW Contact  Ross Ludwig, [place] Phone Number: 02/14/2019, 5:29 PM  Clinical Narrative:     CSW spoke to [facility] they are still able to accept patient once she is medically ready for discharge.   Expected Discharge Plan: [place] Barriers to Discharge: Continued Medical Work up  Expected Discharge Plan and Services Expected Discharge Plan: [place] arrangements for the past 2 months: Single Family Home                 DME Arranged: N/A         HH Arranged: PT HH Agency: [facility] [employer] Date [place]: 02/12/19 Time [place]: 1400 Representative spoke with at [place]: [place] ([place]) Interventions    Readmission Risk Interventions No flowsheet data found.

## 2019-02-14 NOTE — Progress Notes (Signed)
SATURATION QUALIFICATIONS: (This note is used to comply with regulatory documentation for home oxygen)  Patient Saturations on Room Air at Rest = 84%  Patient Saturations on Room Air while Ambulating = 82%  Patient Saturations on 2 Liters of oxygen while Ambulating = 93%  Please briefly explain why patient needs home oxygen: Pt needs home oxygen due to saturation dropping while at rest and ambulating on RA.

## 2019-02-15 ENCOUNTER — Telehealth: Payer: Self-pay | Admitting: Cardiovascular Disease

## 2019-02-15 DIAGNOSIS — J189 Pneumonia, unspecified organism: Secondary | ICD-10-CM | POA: Clinically undetermined

## 2019-02-15 HISTORY — DX: Pneumonia, unspecified organism: J18.9

## 2019-02-15 LAB — BASIC METABOLIC PANEL
Anion gap: 8 (ref 5–15)
BUN: 19 mg/dL (ref 8–23)
CO2: 38 mmol/L — ABNORMAL HIGH (ref 22–32)
Calcium: 8.6 mg/dL — ABNORMAL LOW (ref 8.9–10.3)
Chloride: 93 mmol/L — ABNORMAL LOW (ref 98–111)
Creatinine, Ser: 0.61 mg/dL (ref 0.44–1.00)
GFR calc Af Amer: >60 mL/min (ref >60)
GFR calc non Af Amer: >60 mL/min (ref >60)
Glucose, Bld: 132 mg/dL — ABNORMAL HIGH (ref 70–99)
Potassium: 3.9 mmol/L (ref 3.5–5.1)
Sodium: 139 mmol/L (ref 135–145)

## 2019-02-15 LAB — GLUCOSE, CAPILLARY
Glucose-Capillary: 139 mg/dL — ABNORMAL HIGH (ref 70–99)
Glucose-Capillary: 220 mg/dL — ABNORMAL HIGH (ref 70–99)
Glucose-Capillary: 205 mg/dL — ABNORMAL HIGH (ref 70–99)
Glucose-Capillary: 154 mg/dL — ABNORMAL HIGH (ref 70–99)

## 2019-02-15 LAB — CBC
HCT: 37.3 % (ref 36.0–46.0)
Hemoglobin: 11.7 g/dL — ABNORMAL LOW (ref 12.0–15.0)
MCH: 29.4 pg (ref 26.0–34.0)
MCHC: 31.4 g/dL (ref 30.0–36.0)
MCV: 93.7 fL (ref 80.0–100.0)
Platelets: 245 10*3/uL (ref 150–400)
RBC: 3.98 MIL/uL (ref 3.87–5.11)
RDW: 13.3 % (ref 11.5–15.5)
WBC: 8.3 10*3/uL (ref 4.0–10.5)
nRBC: 0 % (ref 0.0–0.2)

## 2019-02-15 LAB — TROPONIN I (HIGH SENSITIVITY): Troponin I (High Sensitivity): 14 ng/L (ref <18)

## 2019-02-15 MED ORDER — ALPRAZOLAM 0.5 MG PO TABS
0.5000 mg | ORAL_TABLET | Freq: Three times a day (TID) | ORAL | Status: DC
  Administered 2019-02-15 – 2019-02-20 (×16): 0.5 mg via ORAL
  Filled 2019-02-15 (×16): qty 1

## 2019-02-15 MED ORDER — ALPRAZOLAM 0.5 MG PO TABS: 0.5000 mg | ORAL_TABLET | Freq: Three times a day (TID) | ORAL | Status: DC

## 2019-02-15 MED ORDER — ALUM & MAG HYDROXIDE-SIMETH 200-200-20 MG/5ML PO SUSP
30.0000 mL | Freq: Four times a day (QID) | ORAL | Status: DC | PRN
  Administered 2019-02-15 – 2019-02-19 (×3): 30 mL via ORAL
  Filled 2019-02-15 (×3): qty 30

## 2019-02-15 MED ORDER — FUROSEMIDE 10 MG/ML IJ SOLN
20.0000 mg | Freq: Two times a day (BID) | INTRAMUSCULAR | Status: DC
  Administered 2019-02-15 – 2019-02-17 (×6): 20 mg via INTRAVENOUS
  Filled 2019-02-15 (×7): qty 2

## 2019-02-15 MED ORDER — LIDOCAINE VISCOUS HCL 2 % MT SOLN
15.0000 mL | Freq: Four times a day (QID) | OROMUCOSAL | Status: DC | PRN
  Administered 2019-02-15: 15 mL via ORAL
  Filled 2019-02-15 (×3): qty 15

## 2019-02-15 NOTE — Telephone Encounter (Signed)
The patient is calling to inform us that she has been admitted to [facility] 1 week ago.  She is being evaluated for CP and will reach out to [place] once she is released.  She has an 11/24 appt with Nicki Reaper scheduled.

## 2019-02-15 NOTE — Progress Notes (Addendum)
PROGRESS NOTE    [name]  WJX:[id number] DOB: [birth year] DOA: 02/07/2019 PCP: Ria Bush, MD    Brief Narrative:  Patient is an 85 year old Caucasian female with PMH of HTN, HLD, DM2, CAD s/p CABG, diastolic CHF, PAD, DVT, chronic venous insufficiency, diverticulosis, hemorrhoids, cystocele, depression. She presented to the ED on 11/5 with complaint of acute abdominal pain extending from epigastric area to her mid abdomen and right upper quadrantwith no nausea or vomiting or fever or chills.   In the ED, patient was afebrile, hemodynamically stable. Work-up showed WBC count elevated to 25.7. Urinalysis showed cloudy amber-colored urine with moderate amount of leukocytes, many bacteria and WBC clumps. CT abdomen pelvis showed fluid-filled outpouching arising from the first portion of the duodenum with some irregular mural thickening and adjacent inflammation, concerning for contained perforation of a duodenal ulcer or inflamed duodenal diverticulum.  Patient was admitted to hospitalist service for further evaluation and management.  Urine culture and Blood culture obtained on admission both showed more than 100,000 CFU per mL of E. coli, pansensitive.   Assessment & Plan:   Principal Problem:   E coli bacteremia Active Problems:   Diverticulitis of duodenum   Acute on chronic diastolic CHF (congestive heart failure) (HCC)   PNA (pneumonia)   Coronary artery disease   Hypertension   Hyperlipidemia   Type 2 diabetes mellitus with neurological manifestations, controlled (HCC)   Diabetes mellitus type 2 with peripheral artery disease (HCC)   Paroxysmal atrial fibrillation (HCC)   Bacteremia due to Gram-negative bacteria   Dyspnea   Acute respiratory failure with hypoxia (HCC)   AKI (acute kidney injury) (HCC)   Chronic diastolic CHF (congestive heart failure) (HCC)   E. coli UTI   1 E. coli bacteremia/E. coli UTI Patient initially had presented with abdominal  pain.  Patient was pancultured and urine cultures and blood cultures positive for E. coli which was pansensitive.  Patient initially was on IV Zosyn and subsequently IV Rocephin and has been now on IV Unasyn.  Discontinue IV Unasyn and transition to Augmentin to complete a 10-day course of antibiotic treatment.  Follow.   2.  Duodenal diverticulitis Patient seen in consultation by GI and general surgery who recommended medical management with IV antibiotics.  Patient currently on IV Unasyn.  Discontinue IV Unasyn and transition to oral Augmentin to complete a 10-day course of antibiotic treatment.  Per general surgery no surgical intervention required at this time.  Appreciate GI input and recommendations.  3.  Acute respiratory failure with hypoxia secondary to acute on chronic diastolic CHF. Patient noted not to be on oxygen prior to admission.  Chest x-ray done negative for any acute infiltrates.  Patient noted to have some coarse breath sounds/bibasilar crackles on 02/13/2019.Marland Kitchen  Patient was resumed on home dose oral Lasix at 10 mg daily.  Urine output not properly recorded.  Patient's current weight of 155 pounds (02/13/2019) from 143.96 pounds on admission.  Patient received a dose of Lasix 20 mg IV x1 on 02/13/2019.  Patient still hypoxic and as such a CT angiogram chest was obtained consistent with CHF exacerbation and multifocal infiltrates on the right lobe.  BNP noted to be elevated.  Patient placed on Lasix 20 mg IV every 12 hours with a urine output of 1.8 L over the past 24 hours.  Continue diuresis with IV Lasix.  Repeat ambulatory sats tomorrow.  Continue supportive care.  Follow.  4.  Acute kidney injury Creatinine noted to be elevated at  1.6 on day of presentation.  Patient placed on IV fluids with hydration.  Acute kidney injury resolved.  Patient now on diuresis.  Monitor renal function closely.   5.  Paroxysmal atrial fibrillation with RVR Patient with a history of A. fib on oral  metoprolol 37.5 mg twice daily as well as Eliquis for anticoagulation.  On hospital day 2 patient noted to be in RVR failure likely secondary to acute infection of bacteremia.  Patient initially placed on a Cardizem drip which has subsequently been weaned off.  Continue increased dose of metoprolol 100 mg twice daily.  Eliquis for anticoagulation.  Outpatient follow-up.   6.  Acute on chronic diastolic CHF/coronary artery disease status post CABG/PAD/hypertension/hyperlipidemia Patient noted to still be hypoxic.  Patient did state was not on O2 prior to admission.  Chest x-ray done negative.  CT angiogram chest done concerning for acute CHF exacerbation and possible multi focal pneumonia in the right lobe.  BNP also noted to be elevated at 323.  Patient started on Lasix 20 mg IV every 12 hours on 02/14/2019 with a urine output of 1.850 L over the past 24 hours.  Denies any chest pain. Continue increased dose of metoprolol, ramipril, Lasix, statin and Eliquis. May need a higher dose of Lasix on discharge however follow for now.  7.  Well-controlled type 2 diabetes mellitus Hemoglobin A1c 6.9 (02/08/2019).  Patient noted to be on Actos prior to admission.  CBG of 139 this morning.  Continue to hold oral hypoglycemic agents.  Continue sliding scale insulin.  8.  Pneumonia Noted on CT angiogram chest during work-up for patient's hypoxia.  Patient afebrile.  Patient with a normal white count.  Patient was on IV antibiotics and subsequently transition to oral Augmentin.  Will continue Augmentin.  SLP evaluation to rule out aspiration.  Supportive care.   DVT prophylaxis: Eliquis Code Status: Full Family Communication: Updated patient.  No family at bedside. Disposition Plan: Likely home when clinically improved, improvement with hypoxia.    Consultants:   General surgery: Dr. Lysle Pearl 02/08/2019  Gastroenterology: Dr. Alice Reichert 02/08/2019  Procedures:   CT abdomen and pelvis 02/07/2019  Chest x-ray  02/12/2019  2D echo 02/10/2019  CT angiogram chest 02/14/2019  Antimicrobials:   IV Unasyn 02/12/2019>>>> 02/14/2019  IV Rocephin 02/08/2019>>>> 02/11/2019  IV Merrem 02/08/2019>>>> 02/09/2019  IV Zosyn 02/07/2019>>>>> 02/08/2019   Augmentin 02/14/2019   Subjective: Patient in hallway with physical therapist.  Patient states some improvement with shortness of breath.  Denies any chest pain.  Objective: Vitals:   02/14/19 2038 02/15/19 0602 02/15/19 0805 02/15/19 1105  BP: 127/84 122/70 (!) 141/83   Pulse: 72 88 (!) 106 (!) 138  Resp: 20 20 19    Temp: 98.6 F (37 C) 98.1 F (36.7 C) 98.4 F (36.9 C)   TempSrc: Oral Oral Oral   SpO2: 99% 96% 97% 90%  Weight:      Height:        Intake/Output Summary (Last 24 hours) at 02/15/2019 1139 Last data filed at 02/15/2019 0900 Gross per 24 hour  Intake 720 ml  Output 1850 ml  Net -1130 ml   Filed Weights   02/09/19 1742 02/10/19 0430 02/13/19 0452  Weight: 52.7 kg 69.1 kg 70.5 kg    Examination:  General exam: NAD Respiratory system: Scattered crackles.  Decreased breath sounds in the bases.  No wheezing.  Fair air movement.  Speaking in full sentences.  Cardiovascular system: RRR no murmurs rubs or gallops.  No JVD.  Trace to 1+ bilateral lower extremity edema.   Gastrointestinal system: Abdomen is nontender, nondistended, soft, positive bowel sounds.  No rebound.  No guarding.   Central nervous system: Alert and oriented. No focal neurological deficits. Extremities: Symmetric 5 x 5 power. Skin: No rashes, lesions or ulcers Psychiatry: Judgement and insight appear normal. Mood & affect appropriate.     Data Reviewed: I have personally reviewed following labs and imaging studies  CBC: Recent Labs  Lab 02/09/19 0529 02/10/19 0523 02/11/19 0433 02/14/19 0438 02/15/19 0711  WBC 11.9* 11.4* 10.8* 8.4 8.3  NEUTROABS 10.0* 9.8* 8.3*  --   --   HGB 12.1 11.6* 11.6* 11.5* 11.7*  HCT 38.9 36.5 36.4 36.0 37.3  MCV  96.5 93.4 94.1 92.8 93.7  PLT 171 154 172 238 932   Basic Metabolic Panel: Recent Labs  Lab 02/10/19 0523 02/11/19 0433 02/13/19 0923 02/14/19 0438 02/15/19 0711  NA 138 138 139 140 139  K 4.1 4.2 4.1 3.9 3.9  CL 109 107 100 97* 93*  CO2 23 24 30  35* 38*  GLUCOSE 177* 172* 173* 148* 132*  BUN 19 28* 19 23 19   CREATININE 0.67 0.90 0.61 0.62 0.61  CALCIUM 8.2* 8.5* 8.9 8.7* 8.6*   GFR: Estimated Creatinine Clearance: 38.7 mL/min (by C-G formula based on SCr of 0.61 mg/dL). Liver Function Tests: No results for input(s): AST, ALT, ALKPHOS, BILITOT, PROT, ALBUMIN in the last 168 hours. No results for input(s): LIPASE, AMYLASE in the last 168 hours. No results for input(s): AMMONIA in the last 168 hours. Coagulation Profile: No results for input(s): INR, PROTIME in the last 168 hours. Cardiac Enzymes: No results for input(s): CKTOTAL, CKMB, CKMBINDEX, TROPONINI in the last 168 hours. BNP (last 3 results) No results for input(s): PROBNP in the last 8760 hours. HbA1C: No results for input(s): HGBA1C in the last 72 hours. CBG: Recent Labs  Lab 02/14/19 0823 02/14/19 1210 02/14/19 1636 02/14/19 2045 02/15/19 0807  GLUCAP 139* 182* 163* 259* 139*   Lipid Profile: No results for input(s): CHOL, HDL, LDLCALC, TRIG, CHOLHDL, LDLDIRECT in the last 72 hours. Thyroid Function Tests: No results for input(s): TSH, T4TOTAL, [name]4, T3FREE, THYROIDAB in the last 72 hours. Anemia Panel: No results for input(s): VITAMINB12, FOLATE, FERRITIN, TIBC, IRON, RETICCTPCT in the last 72 hours. Sepsis Labs: No results for input(s): PROCALCITON, LATICACIDVEN in the last 168 hours.  Recent Results (from the past 240 hour(s))  Urine culture     Status: Abnormal   Collection Time: 02/07/19  9:29 PM   Specimen: Urine, Random  Result Value Ref Range Status   Specimen Description   Final    URINE, RANDOM Performed at [facility], [address], [place], [place] [address]    Special  Requests   Final    NONE Performed at [facility], [address] [place], [place] [address]    Culture >=100,000 COLONIES/mL ESCHERICHIA COLI (A)  Final   Report Status 02/10/2019 FINAL  Final   Organism ID, Bacteria ESCHERICHIA COLI (A)  Final      Susceptibility   Escherichia coli - MIC*    AMPICILLIN 4 SENSITIVE Sensitive     CEFAZOLIN <=4 SENSITIVE Sensitive     CEFTRIAXONE <=1 SENSITIVE Sensitive     CIPROFLOXACIN <=0.25 SENSITIVE Sensitive     GENTAMICIN <=1 SENSITIVE Sensitive     IMIPENEM <=0.25 SENSITIVE Sensitive     NITROFURANTOIN <=16 SENSITIVE Sensitive     TRIMETH/SULFA <=20 SENSITIVE Sensitive  AMPICILLIN/SULBACTAM <=2 SENSITIVE Sensitive     PIP/TAZO <=4 SENSITIVE Sensitive     Extended ESBL NEGATIVE Sensitive     * >=100,000 COLONIES/mL ESCHERICHIA COLI  Blood culture (routine x 2)     Status: Abnormal   Collection Time: 02/08/19  1:33 AM   Specimen: BLOOD  Result Value Ref Range Status   Specimen Description   Final    BLOOD LEFT ANTECUBITAL Performed at [facility], [address], [place], [place] [address]    Special Requests   Final    BOTTLES DRAWN AEROBIC AND ANAEROBIC Blood Culture adequate volume Performed at [facility], [address] [place], [place] [address]    Culture  Setup Time   Final    GRAM NEGATIVE RODS AEROBIC BOTTLE ONLY CRITICAL RESULT CALLED TO, READ BACK BY AND VERIFIED WITH: JASON ROBBINS AT 6629 02/08/2019.PMF Performed at [facility], [address] [place], [place] [address]    Culture (A)  Final    ESCHERICHIA COLI SUSCEPTIBILITIES PERFORMED ON PREVIOUS CULTURE WITHIN THE LAST 5 DAYS. Performed at [facility], [place] [address], [place], [place] [address]    Report Status 02/10/2019 FINAL  Final  Blood culture (routine x 2)     Status: Abnormal   Collection Time: 02/08/19  1:33 AM   Specimen: BLOOD  Result Value Ref Range Status   Specimen Description   Final     BLOOD BLOOD LEFT HAND Performed at [facility], [address] [place],  [address]    Special Requests   Final    BOTTLES DRAWN AEROBIC AND ANAEROBIC Blood Culture adequate volume Performed at [facility], [address], [place],  [address]    Culture  Setup Time   Final    GRAM NEGATIVE RODS IN BOTH AEROBIC AND ANAEROBIC BOTTLES CRITICAL RESULT CALLED TO, READ BACK BY AND VERIFIED WITH: JASON ROBBINS AT 1456 02/08/2019.PMF Performed at [facility], [place] [address], [place], [place] [address]    Culture ESCHERICHIA COLI (A)  Final   Report Status 02/10/2019 FINAL  Final   Organism ID, Bacteria ESCHERICHIA COLI  Final      Susceptibility   Escherichia coli - MIC*    AMPICILLIN 4 SENSITIVE Sensitive     CEFAZOLIN <=4 SENSITIVE Sensitive     CEFEPIME <=1 SENSITIVE Sensitive     CEFTAZIDIME <=1 SENSITIVE Sensitive     CEFTRIAXONE <=1 SENSITIVE Sensitive     CIPROFLOXACIN <=0.25 SENSITIVE Sensitive     GENTAMICIN <=1 SENSITIVE Sensitive     IMIPENEM <=0.25 SENSITIVE Sensitive     TRIMETH/SULFA <=20 SENSITIVE Sensitive     AMPICILLIN/SULBACTAM <=2 SENSITIVE Sensitive     PIP/TAZO <=4 SENSITIVE Sensitive     Extended ESBL NEGATIVE Sensitive     * ESCHERICHIA COLI  Blood Culture ID Panel (Reflexed)     Status: Abnormal   Collection Time: 02/08/19  1:33 AM  Result Value Ref Range Status   Enterococcus species NOT DETECTED NOT DETECTED Final   Listeria monocytogenes NOT DETECTED NOT DETECTED Final   Staphylococcus species NOT DETECTED NOT DETECTED Final   Staphylococcus aureus (BCID) NOT DETECTED NOT DETECTED Final   Streptococcus species NOT DETECTED NOT DETECTED Final   Streptococcus agalactiae NOT DETECTED NOT DETECTED Final   Streptococcus pneumoniae NOT DETECTED NOT DETECTED Final   Streptococcus pyogenes NOT DETECTED NOT DETECTED Final   Acinetobacter baumannii NOT DETECTED NOT DETECTED Final   Enterobacteriaceae species DETECTED (A)  NOT DETECTED Final    Comment: Enterobacteriaceae represent a large family of gram-negative bacteria, not a single organism. CRITICAL RESULT CALLED TO, READ BACK BY AND VERIFIED WITH: JASON ROBBINS AT 4492 02/08/2019.PMF    Enterobacter cloacae complex NOT DETECTED NOT DETECTED Final   Escherichia coli DETECTED (A) NOT DETECTED Final    Comment: CRITICAL RESULT CALLED TO, READ BACK BY AND VERIFIED WITH: JASON ROBBINS AT 0100 02/08/2019.PMF    Klebsiella oxytoca NOT DETECTED NOT DETECTED Final   Klebsiella pneumoniae NOT DETECTED NOT DETECTED Final   Proteus species NOT DETECTED NOT DETECTED Final   Serratia marcescens NOT DETECTED NOT DETECTED Final   Carbapenem resistance NOT DETECTED NOT DETECTED Final   Haemophilus influenzae NOT DETECTED NOT DETECTED Final   Neisseria meningitidis NOT DETECTED NOT DETECTED Final   Pseudomonas aeruginosa NOT DETECTED NOT DETECTED Final   Candida albicans NOT DETECTED NOT DETECTED Final   Candida glabrata NOT DETECTED NOT DETECTED Final   Candida krusei NOT DETECTED NOT DETECTED Final   Candida parapsilosis NOT DETECTED NOT DETECTED Final   Candida tropicalis NOT DETECTED NOT DETECTED Final    Comment: Performed at [facility], [place], [place] [address]  SARS CORONAVIRUS 2 (TAT 6-24 HRS) Nasopharyngeal Nasopharyngeal Swab     Status: None   Collection Time: 02/08/19  2:38 AM   Specimen: Nasopharyngeal Swab  Result Value Ref Range Status   SARS Coronavirus 2 NEGATIVE NEGATIVE Final    Comment: (NOTE) SARS-CoV-2 target nucleic acids are NOT DETECTED. The SARS-CoV-2 RNA is generally detectable in upper and lower respiratory specimens during the acute phase of infection. Negative results do not preclude SARS-CoV-2 infection, do not rule out co-infections with other pathogens, and should not be used as the sole basis for treatment or other patient management decisions. Negative results must be combined with clinical  observations, patient history, and epidemiological information. The expected result is Negative. Fact Sheet for Patients: SugarRoll.be Fact Sheet for Healthcare Providers: https://www.woods-mathews.com/ This test is not yet approved or cleared by the [place] FDA and  has been authorized for detection and/or diagnosis of SARS-CoV-2 by FDA under an Emergency Use Authorization (EUA). This EUA will remain  in effect (meaning this test can be used) for the duration of the COVID-19 declaration under Section 56 4(b)(1) of the Act, 21 U.S.C. section 360bbb-3(b)(1), unless the authorization is terminated or revoked sooner. Performed at [facility], [place] [address] [place], [place] [address]   MRSA PCR Screening     Status: None   Collection Time: 02/09/19 12:51 AM   Specimen: Nasopharyngeal  Result Value Ref Range Status   MRSA by PCR NEGATIVE NEGATIVE Final    Comment:        The GeneXpert MRSA Assay (FDA approved for NASAL specimens only), is one component of a comprehensive MRSA colonization surveillance program. It is not intended to diagnose MRSA infection nor to guide or monitor treatment for MRSA infections. Performed at [facility], [address] [place], [place] [address]          Radiology Studies: Ct Angio Chest Pe W Or Wo Contrast  Result Date: 02/14/2019 CLINICAL DATA:  85 year old female with history of hypoxemia. Evaluate for pulmonary embolism. EXAM: CT ANGIOGRAPHY CHEST WITH CONTRAST TECHNIQUE: Multidetector CT imaging of the chest was performed using the standard protocol during bolus administration of intravenous contrast. Multiplanar CT image reconstructions and MIPs were obtained to evaluate the vascular anatomy. CONTRAST:  57mL OMNIPAQUE IOHEXOL  350 MG/ML SOLN COMPARISON:  Chest CT 01/05/2014. FINDINGS: Cardiovascular: No filling defects are noted within the pulmonary arterial tree to suggest  underlying pulmonary embolism. Heart size is mildly enlarged. There is no significant pericardial fluid, thickening or pericardial calcification. There is aortic atherosclerosis, as well as atherosclerosis of the great vessels of the mediastinum and the coronary arteries, including calcified atherosclerotic plaque in the left main, left anterior descending, left circumflex and right coronary arteries. Status post median sternotomy for CABG including LIMA to the LAD. Mediastinum/Nodes: No pathologically enlarged mediastinal or hilar lymph nodes. Esophagus is unremarkable in appearance. No axillary lymphadenopathy. Lungs/Pleura: Small bilateral pleural effusions lying dependently. Mild diffuse interlobular septal thickening throughout the mid to lower lungs bilaterally, suggesting a background of mild interstitial pulmonary edema. There are a few patchy areas of ground-glass attenuation in the right lung, most confluent near the apex of the right upper lobe, concerning for potential multifocal infection. No such findings are noted in the left lung. No definite suspicious appearing pulmonary nodules or masses are otherwise noted. Upper Abdomen: Aortic atherosclerosis. Musculoskeletal: Median sternotomy wires. There are no aggressive appearing lytic or blastic lesions noted in the visualized portions of the skeleton. Review of the MIP images confirms the above findings. IMPRESSION: 1. No evidence of pulmonary embolism. 2. The appearance of the chest suggests congestive heart failure, as detailed above. In addition, there is asymmetrically distributed areas of ill-defined airspace consolidation throughout the right lung, concerning for concurrent multilobar bronchopneumonia. 3. Aortic atherosclerosis, in addition to left main and 3 vessel coronary artery disease. Status post median sternotomy for CABG including LIMA to the LAD. Aortic Atherosclerosis (ICD10-I70.0). Electronically Signed   By: Vinnie Langton M.D.   On:  02/14/2019 15:13        Scheduled Meds: . ALPRAZolam  0.5 mg Oral TID  . amoxicillin-clavulanate  1 tablet Oral Q12H  . apixaban  5 mg Oral BID  . furosemide  20 mg Intravenous Q12H  . insulin aspart  0-5 Units Subcutaneous QHS  . insulin aspart  0-9 Units Subcutaneous TID WC  . metoprolol tartrate  100 mg Oral BID  . multivitamin with minerals  1 tablet Oral Daily  . nystatin cream  1 application Topical BID  . pantoprazole  40 mg Oral BID  . pravastatin  20 mg Oral Daily  . ramipril  5 mg Oral Daily  . traZODone  150 mg Oral QHS  . vitamin B-12  1,000 mcg Oral Daily  . vitamin E  400 Units Oral Daily   Continuous Infusions: . sodium chloride 250 mL (02/13/19 2313)     LOS: 7 days    Time spent: 40 minutes    Irine Seal, MD [facility]  If 7PM-7AM, please contact night-coverage [email or web address] 02/15/2019, 11:39 AM

## 2019-02-15 NOTE — Evaluation (Signed)
Clinical/Bedside Swallow Evaluation Patient Details  Name: [name] MRN: [id number] Date of Birth: 06-24-1927  Today's Date: 02/15/2019 Time: SLP Start Time (ACUTE ONLY): 1031 SLP Stop Time (ACUTE ONLY): 1111 SLP Time Calculation (min) (ACUTE ONLY): 40 min  Past Medical History:  Past Medical History:  Diagnosis Date  . Acute deep vein thrombosis (DVT) of distal vein of right lower extremity ([name]) 12/2013   Acute thrombus of R proximal gastrocnemius vein. Symptomatic provoked distal DVT  . Anxiety   . Cellulitis of leg, left 01/16/2014  . Chronic back pain 03/2012   lumbar DDD with herniation - s/p L S1 transforaminal ESI (10/2012 [name]. Sharlet [name])  . Chronic venous insufficiency   . Coronary artery disease    CABG 1998  . Cystocele   . Depression   . Diabetes mellitus    type 2, followed by endo.  . Diastolic dysfunction   . Diverticulosis   . Hemorrhoids   . History of heart attack   . Hx of echocardiogram    a. Echo 10/13: mild LVH, EF 55-60%, Gr 1 diast dysfn, PASP 32  . Hyperlipidemia   . Hypertension   . Irritable bowel syndrome   . Microcytic anemia   . PAD (peripheral artery disease) ([name]) 2013   ABI: R 0.91, L 0.83  . Postmenopausal osteoporosis 01/2019   T score of -2.6  . PUD (peptic ulcer disease)   . Varicose veins   . Venous ulcer of leg ([place]) 12/12/2013   Required prolonged treatment by wound center  . Viral gastroenteritis due to [place] virus 05/13/2015   hospitalization   Past Surgical History:  Past Surgical History:  Procedure Laterality Date  . ABDOMINAL HYSTERECTOMY  1975   TAH,BSO  . ABI  2013   R 0.91, L 0.83  . APPENDECTOMY     per pt report  . CARDIOVASCULAR STRESS TEST  2015   low risk myoview ([name])  . CATARACT SURGERY    . CHOLECYSTECTOMY    . COLONOSCOPY  02/2010   extensive diverticulosis throughout, small internal hemorrhoids, rec rpt 5 yrs ([name]. Allyn [name])  . CORONARY ARTERY BYPASS GRAFT  1998  . dexa scan  09/2011   femur  -0.8, forearm 0.6 => normal  . ENDOVENOUS ABLATION SAPHENOUS VEIN W/ LASER Left 03/2014   [name]  . ESI  2014   L S1 transforaminal ESI x2 ([name]) without improvement  . HAND SURGERY    . lexiscan myoview  03/2011   Small basal inferolateral and anterolateral reversible perfusion defect suggests ischemia.  EF was normal.   old infarct, no new ischemia  . LOWER EXTREMITY ANGIOGRAPHY Right 09/06/2018   Procedure: LOWER EXTREMITY ANGIOGRAPHY;  Surgeon: [name] Huxley, MD;  Location: [facility];  Service: Cardiovascular;  Laterality: Right;  . toe surgery    . TYMPANOPLASTY  1960s   R side  . Vault prolapse A & P repair  2009   [name]   HPI:  [name]  is a 85 y.o. Caucasian female with a known history of multiple medical problems that will be mentioned below, including type 2 diabetes mellitus, hypertension, diverticulosis and depression who presented to the emergency room with acute onset of abdominal pain described as sharp and extending from epigastric area to her mid abdomen and right upper quadrant with no nausea or vomiting or fever or chills.  She admitted to mild diarrhea with loose bowel movements.  No chest pain or dyspnea or palpitations.  No cough or wheezing no headache or dizziness or blurred vision.  She admitted to dyspnea on exertion.  No lower extremity edema.   Assessment / Plan / Recommendation Clinical Impression  The patient is presenting with normal oropharyngeal swallowing with no observed clinical indicators of aspiration.  The patient was assessed with thin liquid via straw, self-feeding puree, and mastication of graham crackers.  The patient does not appear to be at risk for aspiration.  SLP Visit Diagnosis: Dysphagia, unspecified (R13.10)    Aspiration Risk  No limitations    Diet Recommendation Regular;Thin liquid   Liquid Administration via: Straw;Cup Medication Administration: Whole meds with liquid Supervision: Patient able  to self feed    Other  Recommendations Oral Care Recommendations: Oral care BID;Patient independent with oral care   Follow up Recommendations None      Frequency and Duration            Prognosis Prognosis for Safe Diet Advancement: Good      Swallow Study   General Date of Onset: 02/08/19 HPI: [name]  is a 85 y.o. Caucasian female with a known history of multiple medical problems that will be mentioned below, including type 2 diabetes mellitus, hypertension, diverticulosis and depression who presented to the emergency room with acute onset of abdominal pain described as sharp and extending from epigastric area to her mid abdomen and right upper quadrant with no nausea or vomiting or fever or chills.  She admitted to mild diarrhea with loose bowel movements.  No chest pain or dyspnea or palpitations.  No cough or wheezing no headache or dizziness or blurred vision.  She admitted to dyspnea on exertion.  No lower extremity edema. Type of Study: Bedside Swallow Evaluation Previous Swallow Assessment: None known Diet Prior to this Study: Regular;Thin liquids Oral Cavity Assessment: Within Functional Limits Oral Cavity - Dentition: Adequate natural dentition Self-Feeding Abilities: Able to feed self Patient Positioning: Upright in chair Baseline Vocal Quality: Normal Volitional Cough: Strong Volitional Swallow: Able to elicit    Oral/Motor/Sensory Function Overall Oral Motor/Sensory Function: Within functional limits   Ice Chips     Thin Liquid Thin Liquid: Within functional limits Presentation: Straw    Nectar Thick     Honey Thick     Puree Puree: Within functional limits Presentation: Self Fed;Spoon   Solid     Solid: Within functional limits Presentation: Self Fed     [name], [place], [name] 02/15/2019,11:44 AM

## 2019-02-15 NOTE — Progress Notes (Signed)
Physical Therapy Treatment Patient Details Name: [name] MRN: [id number] DOB: December 05, 1927 Today's Date: 02/15/2019    History of Present Illness [name]  is a 85 y.o. Caucasian female with a known history of multiple medical problems that will be mentioned below, including type 2 diabetes mellitus, hypertension, diverticulosis and depression who presented to the emergency room with acute onset of abdominal pain described as sharp and extending from epigastric area to her mid abdomen and right upper quadrant with no nausea or vomiting or fever or chills.    PT Comments    Pt up to chair upon entry. Pt comes STS from chair minA but has improvised incontinence of urine, returned to sitting, transfers up again for donning of diaper, then sits again. Pt comes STS for AMB but then reports need to have BM, hence transitioned to AMB BSC across room. Pt comes to standing minA, assisted with pericare with RN/PT and re-donning of diaper, then AMB into hallway and back, pausing frequently due to what appears to be exertion however pt denies this. Pt on 2L/min throughout, but SpO2 well controlled, however in contrast pt's HR remains largely >120bpm while up AMB and peaks at 138bpm a few times briefly. Pt returned to room, made comfortable. Unable to advance AMB this date d/t HR issues, but O2 flow rate is improved. Pt tolerates standing for 2-3 minutes to converse with hospitaist, but struggles with remaining steady with RW occasionally trying to utilize railing in hallway.      Follow Up Recommendations  SNF;Supervision for mobility/OOB(safety in AMB still limited by incontinence and dependence in ADL; pt unable to AMB  limited household distances without max HR.)     Equipment Recommendations  None recommended by PT    Recommendations for Other Services       Precautions / Restrictions Precautions Precautions: Fall Restrictions Weight Bearing Restrictions: No    Mobility  Bed  Mobility               General bed mobility comments: received up in chair  Transfers Overall transfer level: Needs assistance Equipment used: Rolling walker (2 wheeled) Transfers: Sit to/from [facility] Sit to Stand: Min guard;Min assist(pt prefers anterior surface to pull self up, hence author provides hands when asked.) Stand pivot transfers: Min guard          Ambulation/Gait Ambulation/Gait assistance: Min guard Gait Distance (Feet): 55 Feet Assistive device: Rolling walker (2 wheeled) Gait Pattern/deviations: Step-to pattern     General Gait Details: Pt stops frequently to rest bu tis not forthcoming about reasoning, however HR is noted to be 120s-138bpm while up AMB. Short steps. very slow, remain steady; author assists with O2 tank and maintaining diaper in donned position.   Stairs             Wheelchair Mobility    Modified Rankin (Stroke Patients Only)       Balance Overall balance assessment: Needs assistance Sitting-balance support: Feet supported Sitting balance-Leahy Scale: Good     Standing balance support: Bilateral upper extremity supported;During functional activity Standing balance-Leahy Scale: Fair Standing balance comment: +1 for safety                            Cognition Arousal/Alertness: Awake/alert Behavior During Therapy: WFL for tasks assessed/performed;Impulsive Overall Cognitive Status: Within Functional Limits for tasks assessed  Exercises      General Comments        Pertinent Vitals/Pain Pain Assessment: No/denies pain    Home Living                      Prior Function            PT Goals (current goals can now be found in the care plan section) Acute Rehab PT Goals Patient Stated Goal: to walk PT Goal Formulation: Patient unable to participate in goal setting Time For Goal Achievement: 02/23/19 Potential to  Achieve Goals: Fair Progress towards PT goals: Not progressing toward goals - comment    Frequency    Min 2X/week      PT Plan Current plan remains appropriate    Co-evaluation              AM-PAC PT "6 Clicks" Mobility   Outcome Measure  Help needed turning from your back to your side while in a flat bed without using bedrails?: A [place] Help needed moving from lying on your back to sitting on the side of a flat bed without using bedrails?: A [place] Help needed moving to and from a bed to a chair (including a wheelchair)?: A [place] Help needed standing up from a chair using your arms (e.g., wheelchair or bedside chair)?: A [place] Help needed to walk in hospital room?: A [place] Help needed climbing 3-5 steps with a railing? : A Lot 6 Click Score: 17    End of Session Equipment Utilized During Treatment: Gait belt;Oxygen(diaper) Activity Tolerance: Patient tolerated treatment well;Treatment limited secondary to medical complications (Comment);Patient limited by fatigue(HR in 130s with short distance slow AMB) Patient left: in chair;with chair alarm set;with call bell/phone within reach;with nursing/sitter in room Nurse Communication: Mobility status(RN assisits with pericare and diaper donning) PT Visit Diagnosis: Muscle weakness (generalized) (M62.81);Difficulty in walking, not elsewhere classified (R26.2)     Time: 7414-2395 PT Time Calculation (min) (ACUTE ONLY): 40 min  Charges:  $Therapeutic Exercise: 38-52 mins                     11:35 AM, 02/15/19 Etta Grandchild, PT, DPT Physical Therapist - [facility]  [phone] ([place])    Deserie Dirks C 02/15/2019, 11:31 AM

## 2019-02-15 NOTE — Telephone Encounter (Signed)
New Message  Patient is calling in to make the office aware that she is at [facility] having chest pain. She states that she was admitted and is in room 248. Patient would like a call back to discuss her chest pain.

## 2019-02-16 LAB — GLUCOSE, CAPILLARY
Glucose-Capillary: 143 mg/dL — ABNORMAL HIGH (ref 70–99)
Glucose-Capillary: 147 mg/dL — ABNORMAL HIGH (ref 70–99)
Glucose-Capillary: 220 mg/dL — ABNORMAL HIGH (ref 70–99)
Glucose-Capillary: 147 mg/dL — ABNORMAL HIGH (ref 70–99)

## 2019-02-16 LAB — BASIC METABOLIC PANEL
Anion gap: 9 (ref 5–15)
BUN: 19 mg/dL (ref 8–23)
CO2: 36 mmol/L — ABNORMAL HIGH (ref 22–32)
Calcium: 8.7 mg/dL — ABNORMAL LOW (ref 8.9–10.3)
Chloride: 93 mmol/L — ABNORMAL LOW (ref 98–111)
Creatinine, Ser: 0.71 mg/dL (ref 0.44–1.00)
GFR calc Af Amer: >60 mL/min (ref >60)
GFR calc non Af Amer: >60 mL/min (ref >60)
Glucose, Bld: 135 mg/dL — ABNORMAL HIGH (ref 70–99)
Potassium: 4.5 mmol/L (ref 3.5–5.1)
Sodium: 138 mmol/L (ref 135–145)

## 2019-02-16 LAB — CBC
HCT: 37 % (ref 36.0–46.0)
Hemoglobin: 11.6 g/dL — ABNORMAL LOW (ref 12.0–15.0)
MCH: 29.7 pg (ref 26.0–34.0)
MCHC: 31.4 g/dL (ref 30.0–36.0)
MCV: 94.6 fL (ref 80.0–100.0)
Platelets: 264 10*3/uL (ref 150–400)
RBC: 3.91 MIL/uL (ref 3.87–5.11)
RDW: 13.6 % (ref 11.5–15.5)
WBC: 11.2 10*3/uL — ABNORMAL HIGH (ref 4.0–10.5)
nRBC: 0 % (ref 0.0–0.2)

## 2019-02-16 LAB — MAGNESIUM: Magnesium: 1.8 mg/dL (ref 1.7–2.4)

## 2019-02-16 MED ORDER — MAGNESIUM SULFATE 2 GM/50ML IV SOLN
2.0000 g | Freq: Once | INTRAVENOUS | Status: AC
  Administered 2019-02-16: 2 g via INTRAVENOUS
  Filled 2019-02-16: qty 50

## 2019-02-16 MED ORDER — RISAQUAD PO CAPS
1.0000 | ORAL_CAPSULE | Freq: Every day | ORAL | Status: DC
  Administered 2019-02-16 – 2019-02-20 (×5): 1 via ORAL
  Filled 2019-02-16 (×5): qty 1

## 2019-02-16 NOTE — Progress Notes (Signed)
Patient has been unable to wean to room air this shift. Pt was ambulated on room air with O2 sats maintaining at 87-88 %. Pt ambulated with 2 liters of O2 and sats were 94%. Pt complains of feeling SOB. She states SOB is relieved when given scheduled anxiety medication.

## 2019-02-16 NOTE — Progress Notes (Signed)
PROGRESS NOTE    [name]  GEZ:[id number] DOB: 1927/06/12 DOA: 02/07/2019 PCP: Ria Bush, MD    Brief Narrative:  Patient is an 85 year old Caucasian female with PMH of HTN, HLD, DM2, CAD s/p CABG, diastolic CHF, PAD, DVT, chronic venous insufficiency, diverticulosis, hemorrhoids, cystocele, depression. She presented to the ED on 11/5 with complaint of acute abdominal pain extending from epigastric area to her mid abdomen and right upper quadrantwith no nausea or vomiting or fever or chills.   In the ED, patient was afebrile, hemodynamically stable. Work-up showed WBC count elevated to 25.7. Urinalysis showed cloudy amber-colored urine with moderate amount of leukocytes, many bacteria and WBC clumps. CT abdomen pelvis showed fluid-filled outpouching arising from the first portion of the duodenum with some irregular mural thickening and adjacent inflammation, concerning for contained perforation of a duodenal ulcer or inflamed duodenal diverticulum.  Patient was admitted to hospitalist service for further evaluation and management.  Urine culture and Blood culture obtained on admission both showed more than 100,000 CFU per mL of E. coli, pansensitive.   Assessment & Plan:   Principal Problem:   E coli bacteremia Active Problems:   Diverticulitis of duodenum   Acute on chronic diastolic CHF (congestive heart failure) (HCC)   PNA (pneumonia)   Coronary artery disease   Hypertension   Hyperlipidemia   Type 2 diabetes mellitus with neurological manifestations, controlled (HCC)   Diabetes mellitus type 2 with peripheral artery disease (HCC)   Paroxysmal atrial fibrillation (HCC)   Bacteremia due to Gram-negative bacteria   Dyspnea   Acute respiratory failure with hypoxia (HCC)   AKI (acute kidney injury) (HCC)   Chronic diastolic CHF (congestive heart failure) (HCC)   E. coli UTI   1 E. coli bacteremia/E. coli UTI Patient initially had presented with abdominal  pain.  Patient was pancultured and urine cultures and blood cultures positive for E. coli which was pansensitive.  Patient initially was on IV Zosyn and subsequently IV Rocephin and IV Unasyn and now has been transitioned to oral Augmentin to complete a 10-day course of antibiotic treatment.  Follow.  2.  Duodenal diverticulitis Patient seen in consultation by GI and general surgery who recommended medical management with IV antibiotics.  Patient currently on IV Unasyn.  Discontinue IV Unasyn and transition to oral Augmentin to complete a 10-day course of antibiotic treatment.  Per general surgery no surgical intervention required at this time.  Appreciate GI input and recommendations.  3.  Acute respiratory failure with hypoxia secondary to acute on chronic diastolic CHF. Patient noted not to be on oxygen prior to admission.  Chest x-ray done negative for any acute infiltrates.  Patient noted to have some coarse breath sounds/bibasilar crackles on 02/13/2019.Marland Kitchen  Patient was resumed on home dose oral Lasix at 10 mg daily.  Urine output not properly recorded.  Patient's current weight of 148.4 pounds from 155 pounds (02/13/2019) from 143.96 pounds on admission.  Patient received a dose of Lasix 20 mg IV x1 on 02/13/2019.  Patient was still hypoxic and as such a CT angiogram chest was obtained consistent with CHF exacerbation and multifocal infiltrates on the right lobe.  BNP noted to be elevated.  Patient placed on Lasix 20 mg IV every 12 hours with a urine output of 0.751 L over the past 24 hours.  Continue diuresis with IV Lasix.  Repeat ambulatory sats.  Continue supportive care.  Follow.  4.  Acute kidney injury Creatinine noted to be elevated at 1.6 on  day of presentation.  Patient placed on IV fluids with hydration.  Acute kidney injury resolved.  Patient now on diuresis.  Monitor renal function closely.   5.  Paroxysmal atrial fibrillation with RVR Patient with a history of A. fib on oral metoprolol  37.5 mg twice daily as well as Eliquis for anticoagulation prior to admission.  On hospital day 2 patient noted to be in A. fib with RVR, likely secondary to acute infection of bacteremia.  Patient initially placed on a Cardizem drip which has subsequently been weaned off.  Continue increased dose of metoprolol 100 mg twice daily.  Eliquis for anticoagulation.  Outpatient follow-up.   6.  Acute on chronic diastolic CHF/coronary artery disease status post CABG/PAD/hypertension/hyperlipidemia Patient noted to still be hypoxic.  Patient did state was not on O2 prior to admission.  Chest x-ray done negative.  CT angiogram chest done concerning for acute CHF exacerbation and possible multi focal pneumonia in the right lobe.  BNP also noted to be elevated at 323.  Patient started on Lasix 20 mg IV every 12 hours on 02/14/2019 with a urine output of 0.751 L over the past 24 hours.  Denies any chest pain. Continue increased dose of metoprolol, ramipril, Lasix, statin and Eliquis. May need a higher dose of Lasix on discharge however follow for now.  7.  Well-controlled type 2 diabetes mellitus Hemoglobin A1c 6.9 (02/08/2019).  Patient noted to be on Actos prior to admission.  CBG of 143 this morning.  Continue to hold oral hypoglycemic agents.  Continue sliding scale insulin.  8.  Pneumonia Noted on CT angiogram chest during work-up for patient's hypoxia.  Patient afebrile.  Patient with a normal white count.  Patient was on IV antibiotics and subsequently transition to oral Augmentin.  Continue Augmentin.  Patient evaluated by speech therapy with no signs of aspiration.  Ambulatory sats.  Supportive care.   DVT prophylaxis: Eliquis Code Status: Full Family Communication: Updated patient.  Updated caretaker, [name] at bedside.  Disposition Plan: Likely home when clinically improved, improvement with hypoxia.    Consultants:   General surgery: Dr. Lysle Pearl 02/08/2019  Gastroenterology: Dr. Alice Reichert 02/08/2019   Procedures:   CT abdomen and pelvis 02/07/2019  Chest x-ray 02/12/2019  2D echo 02/10/2019  CT angiogram chest 02/14/2019  Antimicrobials:   IV Unasyn 02/12/2019>>>> 02/14/2019  IV Rocephin 02/08/2019>>>> 02/11/2019  IV Merrem 02/08/2019>>>> 02/09/2019  IV Zosyn 02/07/2019>>>>> 02/08/2019   Augmentin 02/14/2019   Subjective: Patient sitting up in chair with caretaker at bedside.  Patient states does not feel too well.  Patient however states some improvement with shortness of breath over the past 24 hours.  Patient asking to be placed back on her home regimen of align.  Objective: Vitals:   02/16/19 0523 02/16/19 0706 02/16/19 0759 02/16/19 0931  BP: (!) 116/91  140/82   Pulse: 97 (!) 103 (!) 102 (!) 108  Resp:      Temp: 98.1 F (36.7 C)  98.4 F (36.9 C)   TempSrc: Oral  Oral   SpO2: 96% 97% 100%   Weight: 67.3 kg     Height:        Intake/Output Summary (Last 24 hours) at 02/16/2019 1141 Last data filed at 02/16/2019 0647 Gross per 24 hour  Intake 600 ml  Output 752 ml  Net -152 ml   Filed Weights   02/10/19 0430 02/13/19 0452 02/16/19 0523  Weight: 69.1 kg 70.5 kg 67.3 kg    Examination:  General exam: NAD  Respiratory system: Scattered crackles.  Decreased breath sounds in the bases.  No wheezing.  Fair air movement.  Speaking in full sentences.  Cardiovascular system: Regular rate and rhythm no murmurs rubs or gallops.  No JVD.  Trace to 1+ bilateral lower extremity edema.  Gastrointestinal system: Abdomen is soft, nontender, nondistended, positive bowel sounds.  No rebound.  No guarding.     Central nervous system: Alert and oriented. No focal neurological deficits. Extremities: Symmetric 5 x 5 power. Skin: No rashes, lesions or ulcers Psychiatry: Judgement and insight appear normal. Mood & affect appropriate.     Data Reviewed: I have personally reviewed following labs and imaging studies  CBC: Recent Labs  Lab 02/10/19 0523 02/11/19 0433  02/14/19 0438 02/15/19 0711 02/16/19 0525  WBC 11.4* 10.8* 8.4 8.3 11.2*  NEUTROABS 9.8* 8.3*  --   --   --   HGB 11.6* 11.6* 11.5* 11.7* 11.6*  HCT 36.5 36.4 36.0 37.3 37.0  MCV 93.4 94.1 92.8 93.7 94.6  PLT 154 172 238 245 854   Basic Metabolic Panel: Recent Labs  Lab 02/11/19 0433 02/13/19 0923 02/14/19 0438 02/15/19 0711 02/16/19 0525  NA 138 139 140 139 138  K 4.2 4.1 3.9 3.9 4.5  CL 107 100 97* 93* 93*  CO2 24 30 35* 38* 36*  GLUCOSE 172* 173* 148* 132* 135*  BUN 28* 19 23 19 19   CREATININE 0.90 0.61 0.62 0.61 0.71  CALCIUM 8.5* 8.9 8.7* 8.6* 8.7*  MG  --   --   --   --  1.8   GFR: Estimated Creatinine Clearance: 37.7 mL/min (by C-G formula based on SCr of 0.71 mg/dL). Liver Function Tests: No results for input(s): AST, ALT, ALKPHOS, BILITOT, PROT, ALBUMIN in the last 168 hours. No results for input(s): LIPASE, AMYLASE in the last 168 hours. No results for input(s): AMMONIA in the last 168 hours. Coagulation Profile: No results for input(s): INR, PROTIME in the last 168 hours. Cardiac Enzymes: No results for input(s): CKTOTAL, CKMB, CKMBINDEX, TROPONINI in the last 168 hours. BNP (last 3 results) No results for input(s): PROBNP in the last 8760 hours. HbA1C: No results for input(s): HGBA1C in the last 72 hours. CBG: Recent Labs  Lab 02/15/19 0807 02/15/19 1149 02/15/19 1651 02/15/19 2052 02/16/19 0815  GLUCAP 139* 220* 205* 154* 143*   Lipid Profile: No results for input(s): CHOL, HDL, LDLCALC, TRIG, CHOLHDL, LDLDIRECT in the last 72 hours. Thyroid Function Tests: No results for input(s): TSH, T4TOTAL, [name]4, T3FREE, THYROIDAB in the last 72 hours. Anemia Panel: No results for input(s): VITAMINB12, FOLATE, FERRITIN, TIBC, IRON, RETICCTPCT in the last 72 hours. Sepsis Labs: No results for input(s): PROCALCITON, LATICACIDVEN in the last 168 hours.  Recent Results (from the past 240 hour(s))  Urine culture     Status: Abnormal   Collection Time:  02/07/19  9:29 PM   Specimen: Urine, Random  Result Value Ref Range Status   Specimen Description   Final    URINE, RANDOM Performed at [facility], [address], [place], [place] [address]    Special Requests   Final    NONE Performed at [facility], [address] [place], [place] [address]    Culture >=100,000 COLONIES/mL ESCHERICHIA COLI (A)  Final   Report Status 02/10/2019 FINAL  Final   Organism ID, Bacteria ESCHERICHIA COLI (A)  Final      Susceptibility   Escherichia coli - MIC*    AMPICILLIN 4 SENSITIVE Sensitive  CEFAZOLIN <=4 SENSITIVE Sensitive     CEFTRIAXONE <=1 SENSITIVE Sensitive     CIPROFLOXACIN <=0.25 SENSITIVE Sensitive     GENTAMICIN <=1 SENSITIVE Sensitive     IMIPENEM <=0.25 SENSITIVE Sensitive     NITROFURANTOIN <=16 SENSITIVE Sensitive     TRIMETH/SULFA <=20 SENSITIVE Sensitive     AMPICILLIN/SULBACTAM <=2 SENSITIVE Sensitive     PIP/TAZO <=4 SENSITIVE Sensitive     Extended ESBL NEGATIVE Sensitive     * >=100,000 COLONIES/mL ESCHERICHIA COLI  Blood culture (routine x 2)     Status: Abnormal   Collection Time: 02/08/19  1:33 AM   Specimen: BLOOD  Result Value Ref Range Status   Specimen Description   Final    BLOOD LEFT ANTECUBITAL Performed at [facility], [address] [place], [place] [address]    Special Requests   Final    BOTTLES DRAWN AEROBIC AND ANAEROBIC Blood Culture adequate volume Performed at [facility], [address] [place], [place] [address]    Culture  Setup Time   Final    GRAM NEGATIVE RODS AEROBIC BOTTLE ONLY CRITICAL RESULT CALLED TO, READ BACK BY AND VERIFIED WITH: JASON ROBBINS AT 0174 02/08/2019.PMF Performed at [facility], [address] [place], [place] [address]    Culture (A)  Final    ESCHERICHIA COLI SUSCEPTIBILITIES PERFORMED ON PREVIOUS CULTURE WITHIN THE LAST 5 DAYS. Performed at [facility], [place] [address], [place], [place] [address]     Report Status 02/10/2019 FINAL  Final  Blood culture (routine x 2)     Status: Abnormal   Collection Time: 02/08/19  1:33 AM   Specimen: BLOOD  Result Value Ref Range Status   Specimen Description   Final    BLOOD BLOOD LEFT HAND Performed at [facility], [address], [place], [place] [address]    Special Requests   Final    BOTTLES DRAWN AEROBIC AND ANAEROBIC Blood Culture adequate volume Performed at [facility], [address], [place], [place] [address]    Culture  Setup Time   Final    GRAM NEGATIVE RODS IN BOTH AEROBIC AND ANAEROBIC BOTTLES CRITICAL RESULT CALLED TO, READ BACK BY AND VERIFIED WITH: JASON ROBBINS AT 1456 02/08/2019.PMF Performed at [facility], [place] [address], [place], [place] [address]    Culture ESCHERICHIA COLI (A)  Final   Report Status 02/10/2019 FINAL  Final   Organism ID, Bacteria ESCHERICHIA COLI  Final      Susceptibility   Escherichia coli - MIC*    AMPICILLIN 4 SENSITIVE Sensitive     CEFAZOLIN <=4 SENSITIVE Sensitive     CEFEPIME <=1 SENSITIVE Sensitive     CEFTAZIDIME <=1 SENSITIVE Sensitive     CEFTRIAXONE <=1 SENSITIVE Sensitive     CIPROFLOXACIN <=0.25 SENSITIVE Sensitive     GENTAMICIN <=1 SENSITIVE Sensitive     IMIPENEM <=0.25 SENSITIVE Sensitive     TRIMETH/SULFA <=20 SENSITIVE Sensitive     AMPICILLIN/SULBACTAM <=2 SENSITIVE Sensitive     PIP/TAZO <=4 SENSITIVE Sensitive     Extended ESBL NEGATIVE Sensitive     * ESCHERICHIA COLI  Blood Culture ID Panel (Reflexed)     Status: Abnormal   Collection Time: 02/08/19  1:33 AM  Result Value Ref Range Status   Enterococcus species NOT DETECTED NOT DETECTED Final   Listeria monocytogenes NOT DETECTED NOT DETECTED Final   Staphylococcus species NOT DETECTED NOT DETECTED Final   Staphylococcus aureus (BCID) NOT  DETECTED NOT DETECTED Final   Streptococcus species NOT DETECTED NOT DETECTED Final   Streptococcus agalactiae NOT DETECTED NOT DETECTED Final    Streptococcus pneumoniae NOT DETECTED NOT DETECTED Final   Streptococcus pyogenes NOT DETECTED NOT DETECTED Final   Acinetobacter baumannii NOT DETECTED NOT DETECTED Final   Enterobacteriaceae species DETECTED (A) NOT DETECTED Final    Comment: Enterobacteriaceae represent a large family of gram-negative bacteria, not a single organism. CRITICAL RESULT CALLED TO, READ BACK BY AND VERIFIED WITH: JASON ROBBINS AT 2376 02/08/2019.PMF    Enterobacter cloacae complex NOT DETECTED NOT DETECTED Final   Escherichia coli DETECTED (A) NOT DETECTED Final    Comment: CRITICAL RESULT CALLED TO, READ BACK BY AND VERIFIED WITH: JASON ROBBINS AT 2831 02/08/2019.PMF    Klebsiella oxytoca NOT DETECTED NOT DETECTED Final   Klebsiella pneumoniae NOT DETECTED NOT DETECTED Final   Proteus species NOT DETECTED NOT DETECTED Final   Serratia marcescens NOT DETECTED NOT DETECTED Final   Carbapenem resistance NOT DETECTED NOT DETECTED Final   Haemophilus influenzae NOT DETECTED NOT DETECTED Final   Neisseria meningitidis NOT DETECTED NOT DETECTED Final   Pseudomonas aeruginosa NOT DETECTED NOT DETECTED Final   Candida albicans NOT DETECTED NOT DETECTED Final   Candida glabrata NOT DETECTED NOT DETECTED Final   Candida krusei NOT DETECTED NOT DETECTED Final   Candida parapsilosis NOT DETECTED NOT DETECTED Final   Candida tropicalis NOT DETECTED NOT DETECTED Final    Comment: Performed at [facility], [place], [place] [address]  SARS CORONAVIRUS 2 (TAT 6-24 HRS) Nasopharyngeal Nasopharyngeal Swab     Status: None   Collection Time: 02/08/19  2:38 AM   Specimen: Nasopharyngeal Swab  Result Value Ref Range Status   SARS Coronavirus 2 NEGATIVE NEGATIVE Final    Comment: (NOTE) SARS-CoV-2 target nucleic acids are NOT DETECTED. The SARS-CoV-2 RNA is generally detectable in upper and lower respiratory specimens during the acute phase of infection. Negative results do not preclude SARS-CoV-2  infection, do not rule out co-infections with other pathogens, and should not be used as the sole basis for treatment or other patient management decisions. Negative results must be combined with clinical observations, patient history, and epidemiological information. The expected result is Negative. Fact Sheet for Patients: SugarRoll.be Fact Sheet for Healthcare Providers: https://www.woods-mathews.com/ This test is not yet approved or cleared by the [place] FDA and  has been authorized for detection and/or diagnosis of SARS-CoV-2 by FDA under an Emergency Use Authorization (EUA). This EUA will remain  in effect (meaning this test can be used) for the duration of the COVID-19 declaration under Section 56 4(b)(1) of the Act, 21 U.S.C. section 360bbb-3(b)(1), unless the authorization is terminated or revoked sooner. Performed at [facility], [place] [address], [place], [place] [address]   MRSA PCR Screening     Status: None   Collection Time: 02/09/19 12:51 AM   Specimen: Nasopharyngeal  Result Value Ref Range Status   MRSA by PCR NEGATIVE NEGATIVE Final    Comment:        The GeneXpert MRSA Assay (FDA approved for NASAL specimens only), is one component of a comprehensive MRSA colonization surveillance program. It is not intended to diagnose MRSA infection nor to guide or monitor treatment for MRSA infections. Performed at [facility], [address], [place], [place] [address]          Radiology Studies: Ct Angio Chest Pe W Or Wo Contrast  Result Date: 02/14/2019  CLINICAL DATA:  85 year old female with history of hypoxemia. Evaluate for pulmonary embolism. EXAM: CT ANGIOGRAPHY CHEST WITH CONTRAST TECHNIQUE: Multidetector CT imaging of the chest was performed using the standard protocol during bolus administration of intravenous contrast. Multiplanar CT image reconstructions and MIPs were obtained to evaluate  the vascular anatomy. CONTRAST:  71mL OMNIPAQUE IOHEXOL 350 MG/ML SOLN COMPARISON:  Chest CT 01/05/2014. FINDINGS: Cardiovascular: No filling defects are noted within the pulmonary arterial tree to suggest underlying pulmonary embolism. Heart size is mildly enlarged. There is no significant pericardial fluid, thickening or pericardial calcification. There is aortic atherosclerosis, as well as atherosclerosis of the great vessels of the mediastinum and the coronary arteries, including calcified atherosclerotic plaque in the left main, left anterior descending, left circumflex and right coronary arteries. Status post median sternotomy for CABG including LIMA to the LAD. Mediastinum/Nodes: No pathologically enlarged mediastinal or hilar lymph nodes. Esophagus is unremarkable in appearance. No axillary lymphadenopathy. Lungs/Pleura: Small bilateral pleural effusions lying dependently. Mild diffuse interlobular septal thickening throughout the mid to lower lungs bilaterally, suggesting a background of mild interstitial pulmonary edema. There are a few patchy areas of ground-glass attenuation in the right lung, most confluent near the apex of the right upper lobe, concerning for potential multifocal infection. No such findings are noted in the left lung. No definite suspicious appearing pulmonary nodules or masses are otherwise noted. Upper Abdomen: Aortic atherosclerosis. Musculoskeletal: Median sternotomy wires. There are no aggressive appearing lytic or blastic lesions noted in the visualized portions of the skeleton. Review of the MIP images confirms the above findings. IMPRESSION: 1. No evidence of pulmonary embolism. 2. The appearance of the chest suggests congestive heart failure, as detailed above. In addition, there is asymmetrically distributed areas of ill-defined airspace consolidation throughout the right lung, concerning for concurrent multilobar bronchopneumonia. 3. Aortic atherosclerosis, in addition to  left main and 3 vessel coronary artery disease. Status post median sternotomy for CABG including LIMA to the LAD. Aortic Atherosclerosis (ICD10-I70.0). Electronically Signed   By: Vinnie Langton M.D.   On: 02/14/2019 15:13        Scheduled Meds: . Align  1 capsule Oral Daily  . ALPRAZolam  0.5 mg Oral TID  . amoxicillin-clavulanate  1 tablet Oral Q12H  . apixaban  5 mg Oral BID  . furosemide  20 mg Intravenous Q12H  . insulin aspart  0-5 Units Subcutaneous QHS  . insulin aspart  0-9 Units Subcutaneous TID WC  . metoprolol tartrate  100 mg Oral BID  . multivitamin with minerals  1 tablet Oral Daily  . nystatin cream  1 application Topical BID  . pantoprazole  40 mg Oral BID  . pravastatin  20 mg Oral Daily  . ramipril  5 mg Oral Daily  . traZODone  150 mg Oral QHS  . vitamin B-12  1,000 mcg Oral Daily  . vitamin E  400 Units Oral Daily   Continuous Infusions: . sodium chloride 250 mL (02/13/19 2313)     LOS: 8 days    Time spent: 40 minutes    Irine Seal, MD [facility]  If 7PM-7AM, please contact night-coverage [email or web address] 02/16/2019, 11:41 AM

## 2019-02-17 LAB — BASIC METABOLIC PANEL
Anion gap: 6 (ref 5–15)
BUN: 19 mg/dL (ref 8–23)
CO2: 36 mmol/L — ABNORMAL HIGH (ref 22–32)
Calcium: 8.5 mg/dL — ABNORMAL LOW (ref 8.9–10.3)
Chloride: 94 mmol/L — ABNORMAL LOW (ref 98–111)
Creatinine, Ser: 0.63 mg/dL (ref 0.44–1.00)
GFR calc Af Amer: >60 mL/min (ref >60)
GFR calc non Af Amer: >60 mL/min (ref >60)
Glucose, Bld: 127 mg/dL — ABNORMAL HIGH (ref 70–99)
Potassium: 4.4 mmol/L (ref 3.5–5.1)
Sodium: 136 mmol/L (ref 135–145)

## 2019-02-17 LAB — GLUCOSE, CAPILLARY
Glucose-Capillary: 132 mg/dL — ABNORMAL HIGH (ref 70–99)
Glucose-Capillary: 157 mg/dL — ABNORMAL HIGH (ref 70–99)
Glucose-Capillary: 154 mg/dL — ABNORMAL HIGH (ref 70–99)
Glucose-Capillary: 139 mg/dL — ABNORMAL HIGH (ref 70–99)

## 2019-02-17 LAB — CBC
HCT: 36.1 % (ref 36.0–46.0)
Hemoglobin: 11.5 g/dL — ABNORMAL LOW (ref 12.0–15.0)
MCH: 29.4 pg (ref 26.0–34.0)
MCHC: 31.9 g/dL (ref 30.0–36.0)
MCV: 92.3 fL (ref 80.0–100.0)
Platelets: 259 10*3/uL (ref 150–400)
RBC: 3.91 MIL/uL (ref 3.87–5.11)
RDW: 13.7 % (ref 11.5–15.5)
WBC: 9.5 10*3/uL (ref 4.0–10.5)
nRBC: 0 % (ref 0.0–0.2)

## 2019-02-17 LAB — MAGNESIUM: Magnesium: 2.3 mg/dL (ref 1.7–2.4)

## 2019-02-17 MED ORDER — FUROSEMIDE 10 MG/ML IJ SOLN
20.0000 mg | Freq: Once | INTRAMUSCULAR | Status: AC
  Administered 2019-02-17: 20 mg via INTRAVENOUS

## 2019-02-17 MED ORDER — ALPRAZOLAM 0.5 MG PO TABS
0.5000 mg | ORAL_TABLET | Freq: Once | ORAL | Status: AC
  Administered 2019-02-17: 0.5 mg via ORAL
  Filled 2019-02-17: qty 1

## 2019-02-17 NOTE — Progress Notes (Signed)
Patient ambulated with NT in hallway.  Patient with noted weakness with ambulating.  Oxygen level on room air at 83%.

## 2019-02-17 NOTE — Progress Notes (Signed)
PROGRESS NOTE    [name]  CZY:[id number] DOB: April 24, 1927 DOA: 02/07/2019 PCP: Ria Bush, MD    Brief Narrative:  Patient is an 85 year old Caucasian female with PMH of HTN, HLD, DM2, CAD s/p CABG, diastolic CHF, PAD, DVT, chronic venous insufficiency, diverticulosis, hemorrhoids, cystocele, depression. She presented to the ED on 11/5 with complaint of acute abdominal pain extending from epigastric area to her mid abdomen and right upper quadrantwith no nausea or vomiting or fever or chills.   In the ED, patient was afebrile, hemodynamically stable. Work-up showed WBC count elevated to 25.7. Urinalysis showed cloudy amber-colored urine with moderate amount of leukocytes, many bacteria and WBC clumps. CT abdomen pelvis showed fluid-filled outpouching arising from the first portion of the duodenum with some irregular mural thickening and adjacent inflammation, concerning for contained perforation of a duodenal ulcer or inflamed duodenal diverticulum.  Patient was admitted to hospitalist service for further evaluation and management.  Urine culture and Blood culture obtained on admission both showed more than 100,000 CFU per mL of E. coli, pansensitive.   Assessment & Plan:   Principal Problem:   E coli bacteremia Active Problems:   Diverticulitis of duodenum   Acute on chronic diastolic CHF (congestive heart failure) (HCC)   PNA (pneumonia)   Coronary artery disease   Hypertension   Hyperlipidemia   Type 2 diabetes mellitus with neurological manifestations, controlled (HCC)   Diabetes mellitus type 2 with peripheral artery disease (HCC)   Paroxysmal atrial fibrillation (HCC)   Bacteremia due to Gram-negative bacteria   Dyspnea   Acute respiratory failure with hypoxia (HCC)   AKI (acute kidney injury) (HCC)   Chronic diastolic CHF (congestive heart failure) (HCC)   E. coli UTI   1 E. coli bacteremia/E. coli UTI Patient initially had presented with abdominal  pain.  Patient was pancultured and urine cultures and blood cultures positive for E. coli which was pansensitive.  Patient initially was on IV Zosyn and subsequently IV Rocephin and IV Unasyn and now has been transitioned to oral Augmentin to complete a 10-day course of antibiotic treatment.  Follow.  2.  Duodenal diverticulitis Patient seen in consultation by GI and general surgery who recommended medical management with IV antibiotics.  Patient was on IV Unasyn.  Discontinued IV Unasyn and transitioned to oral Augmentin to complete a 10-day course of antibiotic treatment.  Per general surgery no surgical intervention required at this time.  Appreciate GI input and recommendations.  3.  Acute respiratory failure with hypoxia secondary to acute on chronic diastolic CHF. Patient noted not to be on oxygen prior to admission.  Chest x-ray done negative for any acute infiltrates.  Patient noted to have some coarse breath sounds/bibasilar crackles on 02/13/2019.Marland Kitchen  Patient was resumed on home dose oral Lasix at 10 mg daily.  Urine output not properly recorded.  Patient's current weight of 148.4 pounds (02/16/2019) from 155 pounds (02/13/2019) from 143.96 pounds on admission.  Patient received a dose of Lasix 20 mg IV x1 on 02/13/2019.  Patient was still hypoxic and as such a CT angiogram chest was obtained consistent with CHF exacerbation and multifocal infiltrates on the right lobe.  BNP noted to be elevated.  Patient placed on Lasix 20 mg IV every 12 hours with a urine output of 250 cc over the past 24 hours.  Urine output likely not correctly reported.  We will give an additional dose of Lasix 20 mg IV x1.  Continue diuresis with IV Lasix.  Repeat ambulatory  sats tomorrow.  Continue supportive care.  Follow.  4.  Acute kidney injury Creatinine noted to be elevated at 1.6 on day of presentation.  Patient placed on IV fluids with hydration.  Acute kidney injury resolved.  Patient now on diuresis.  Creatinine  currently at 0.63.  Monitor renal function closely.   5.  Paroxysmal atrial fibrillation with RVR Patient with a history of A. fib on oral metoprolol 37.5 mg twice daily as well as Eliquis for anticoagulation prior to admission.  On hospital day 2 patient noted to be in A. fib with RVR, likely secondary to acute infection of bacteremia.  Patient initially placed on a Cardizem drip which has subsequently been weaned off.  Continue increased dose of metoprolol 100 mg twice daily.  Eliquis for anticoagulation.  Outpatient follow-up.   6.  Acute on chronic diastolic CHF/coronary artery disease status post CABG/PAD/hypertension/hyperlipidemia Patient noted to still be hypoxic.  Patient did state was not on O2 prior to admission.  Chest x-ray done negative.  CT angiogram chest done concerning for acute CHF exacerbation and possible multi focal pneumonia in the right lobe.  BNP also noted to be elevated at 323.  Patient started on Lasix 20 mg IV every 12 hours on 02/14/2019 with a urine output of 250 cc over the past 24 hours.  Urine output over the past 24 hours likely not properly recorded.  Denies any chest pain. Continue increased dose of metoprolol, ramipril, Lasix, statin and Eliquis.  We will give extra dose of Lasix 20 mg IV x1 today.  May need a higher dose of Lasix on discharge however follow for now.  7.  Well-controlled type 2 diabetes mellitus Hemoglobin A1c 6.9 (02/08/2019).  Patient noted to be on Actos prior to admission.  CBG of 132 this morning.  Continue to hold oral hypoglycemic agents.  Continue sliding scale insulin.  8.  Pneumonia Noted on CT angiogram chest during work-up for patient's hypoxia.  Patient afebrile.  Patient with a normal white count.  Patient was on IV antibiotics and subsequently transition to oral Augmentin which we will continue.  Patient has been assessed by speech therapy with no signs of aspiration.  Supportive care.   DVT prophylaxis: Eliquis Code Status: Full  Family Communication: Updated patient.  Updated caretaker, [name] at bedside.  Disposition Plan: Likely home when clinically improved, improvement with hypoxia.    Consultants:   General surgery: Dr. Lysle Pearl 02/08/2019  Gastroenterology: Dr. Alice Reichert 02/08/2019  Procedures:   CT abdomen and pelvis 02/07/2019  Chest x-ray 02/12/2019  2D echo 02/10/2019  CT angiogram chest 02/14/2019  Antimicrobials:   IV Unasyn 02/12/2019>>>> 02/14/2019  IV Rocephin 02/08/2019>>>> 02/11/2019  IV Merrem 02/08/2019>>>> 02/09/2019  IV Zosyn 02/07/2019>>>>> 02/08/2019   Augmentin 02/14/2019   Subjective: Patient sitting up in chair.  Few shortness of breath improving.  Denies any chest pain.  Wants to be ambulated today.    Objective: Vitals:   02/16/19 0931 02/16/19 2033 02/17/19 0327 02/17/19 0757  BP:  (!) 119/55 (!) 130/95 (!) 145/65  Pulse: (!) 108 89 (!) 43 82  Resp:  20 20 18   Temp:  97.8 F (36.6 C) 98 F (36.7 C) 97.8 F (36.6 C)  TempSrc:  Oral Oral Oral  SpO2:  98% 98% 96%  Weight:      Height:        Intake/Output Summary (Last 24 hours) at 02/17/2019 1210 Last data filed at 02/17/2019 1151 Gross per 24 hour  Intake 307.83 ml  Output 750 ml  Net -442.17 ml   Filed Weights   02/10/19 0430 02/13/19 0452 02/16/19 0523  Weight: 69.1 kg 70.5 kg 67.3 kg    Examination:  General exam: NAD Respiratory system: Bibasilar crackles.  Decreased breath sounds in the bases.  No wheezing.  Fair air movement.  Speaking in full sentences.   Cardiovascular system: RRR no murmurs rubs or gallops.  No JVD.  Trace bilateral lower extremity edema.  Gastrointestinal system: Abdomen is soft, nontender, nondistended, positive bowel sounds.  No rebound.  No guarding.    Central nervous system: Alert and oriented. No focal neurological deficits. Extremities: Symmetric 5 x 5 power. Skin: No rashes, lesions or ulcers Psychiatry: Judgement and insight appear normal. Mood & affect appropriate.      Data Reviewed: I have personally reviewed following labs and imaging studies  CBC: Recent Labs  Lab 02/11/19 0433 02/14/19 0438 02/15/19 0711 02/16/19 0525 02/17/19 0518  WBC 10.8* 8.4 8.3 11.2* 9.5  NEUTROABS 8.3*  --   --   --   --   HGB 11.6* 11.5* 11.7* 11.6* 11.5*  HCT 36.4 36.0 37.3 37.0 36.1  MCV 94.1 92.8 93.7 94.6 92.3  PLT 172 238 245 264 209   Basic Metabolic Panel: Recent Labs  Lab 02/13/19 0923 02/14/19 0438 02/15/19 0711 02/16/19 0525 02/17/19 0518  NA 139 140 139 138 136  K 4.1 3.9 3.9 4.5 4.4  CL 100 97* 93* 93* 94*  CO2 30 35* 38* 36* 36*  GLUCOSE 173* 148* 132* 135* 127*  BUN 19 23 19 19 19   CREATININE 0.61 0.62 0.61 0.71 0.63  CALCIUM 8.9 8.7* 8.6* 8.7* 8.5*  MG  --   --   --  1.8 2.3   GFR: Estimated Creatinine Clearance: 37.7 mL/min (by C-G formula based on SCr of 0.63 mg/dL). Liver Function Tests: No results for input(s): AST, ALT, ALKPHOS, BILITOT, PROT, ALBUMIN in the last 168 hours. No results for input(s): LIPASE, AMYLASE in the last 168 hours. No results for input(s): AMMONIA in the last 168 hours. Coagulation Profile: No results for input(s): INR, PROTIME in the last 168 hours. Cardiac Enzymes: No results for input(s): CKTOTAL, CKMB, CKMBINDEX, TROPONINI in the last 168 hours. BNP (last 3 results) No results for input(s): PROBNP in the last 8760 hours. HbA1C: No results for input(s): HGBA1C in the last 72 hours. CBG: Recent Labs  Lab 02/16/19 1250 02/16/19 1642 02/16/19 2200 02/17/19 0759 02/17/19 1143  GLUCAP 147* 220* 147* 132* 157*   Lipid Profile: No results for input(s): CHOL, HDL, LDLCALC, TRIG, CHOLHDL, LDLDIRECT in the last 72 hours. Thyroid Function Tests: No results for input(s): TSH, T4TOTAL, [name]4, T3FREE, THYROIDAB in the last 72 hours. Anemia Panel: No results for input(s): VITAMINB12, FOLATE, FERRITIN, TIBC, IRON, RETICCTPCT in the last 72 hours. Sepsis Labs: No results for input(s): PROCALCITON,  LATICACIDVEN in the last 168 hours.  Recent Results (from the past 240 hour(s))  Urine culture     Status: Abnormal   Collection Time: 02/07/19  9:29 PM   Specimen: Urine, Random  Result Value Ref Range Status   Specimen Description   Final    URINE, RANDOM Performed at [facility], [address], [place], [place] [address]    Special Requests   Final    NONE Performed at [facility], [address] [place], [place] [address]    Culture >=100,000 COLONIES/mL ESCHERICHIA COLI (A)  Final   Report Status 02/10/2019 FINAL  Final  Organism ID, Bacteria ESCHERICHIA COLI (A)  Final      Susceptibility   Escherichia coli - MIC*    AMPICILLIN 4 SENSITIVE Sensitive     CEFAZOLIN <=4 SENSITIVE Sensitive     CEFTRIAXONE <=1 SENSITIVE Sensitive     CIPROFLOXACIN <=0.25 SENSITIVE Sensitive     GENTAMICIN <=1 SENSITIVE Sensitive     IMIPENEM <=0.25 SENSITIVE Sensitive     NITROFURANTOIN <=16 SENSITIVE Sensitive     TRIMETH/SULFA <=20 SENSITIVE Sensitive     AMPICILLIN/SULBACTAM <=2 SENSITIVE Sensitive     PIP/TAZO <=4 SENSITIVE Sensitive     Extended ESBL NEGATIVE Sensitive     * >=100,000 COLONIES/mL ESCHERICHIA COLI  Blood culture (routine x 2)     Status: Abnormal   Collection Time: 02/08/19  1:33 AM   Specimen: BLOOD  Result Value Ref Range Status   Specimen Description   Final    BLOOD LEFT ANTECUBITAL Performed at [facility], [address], [place], [place] [address]    Special Requests   Final    BOTTLES DRAWN AEROBIC AND ANAEROBIC Blood Culture adequate volume Performed at [facility], [address] [place], [place] [address]    Culture  Setup Time   Final    GRAM NEGATIVE RODS AEROBIC BOTTLE ONLY CRITICAL RESULT CALLED TO, READ BACK BY AND VERIFIED WITH: JASON ROBBINS AT 1456 02/08/2019.PMF Performed at [facility], [address] [place], [place] [address]    Culture (A)  Final    ESCHERICHIA COLI  SUSCEPTIBILITIES PERFORMED ON PREVIOUS CULTURE WITHIN THE LAST 5 DAYS. Performed at [facility], [place] [address], [place], [place] [address]    Report Status 02/10/2019 FINAL  Final  Blood culture (routine x 2)     Status: Abnormal   Collection Time: 02/08/19  1:33 AM   Specimen: BLOOD  Result Value Ref Range Status   Specimen Description   Final    BLOOD BLOOD LEFT HAND Performed at [facility], [address] [place], [place] [address]    Special Requests   Final    BOTTLES DRAWN AEROBIC AND ANAEROBIC Blood Culture adequate volume Performed at [facility], [address] [place],  [address]    Culture  Setup Time   Final    GRAM NEGATIVE RODS IN BOTH AEROBIC AND ANAEROBIC BOTTLES CRITICAL RESULT CALLED TO, READ BACK BY AND VERIFIED WITH: JASON ROBBINS AT 1456 02/08/2019.PMF Performed at [facility], [place] [address], [place], [place] [address]    Culture ESCHERICHIA COLI (A)  Final   Report Status 02/10/2019 FINAL  Final   Organism ID, Bacteria ESCHERICHIA COLI  Final      Susceptibility   Escherichia coli - MIC*    AMPICILLIN 4 SENSITIVE Sensitive     CEFAZOLIN <=4 SENSITIVE Sensitive     CEFEPIME <=1 SENSITIVE Sensitive     CEFTAZIDIME <=1 SENSITIVE Sensitive     CEFTRIAXONE <=1 SENSITIVE Sensitive     CIPROFLOXACIN <=0.25 SENSITIVE Sensitive     GENTAMICIN <=1 SENSITIVE Sensitive     IMIPENEM <=0.25 SENSITIVE Sensitive     TRIMETH/SULFA <=20 SENSITIVE Sensitive     AMPICILLIN/SULBACTAM <=2 SENSITIVE Sensitive     PIP/TAZO <=4 SENSITIVE Sensitive     Extended ESBL NEGATIVE Sensitive     * ESCHERICHIA COLI  Blood Culture ID Panel (Reflexed)     Status: Abnormal   Collection Time: 02/08/19  1:33 AM  Result Value Ref Range Status  Enterococcus species NOT DETECTED NOT DETECTED Final   Listeria monocytogenes NOT DETECTED NOT DETECTED Final   Staphylococcus species NOT DETECTED NOT DETECTED Final   Staphylococcus aureus (BCID)  NOT DETECTED NOT DETECTED Final   Streptococcus species NOT DETECTED NOT DETECTED Final   Streptococcus agalactiae NOT DETECTED NOT DETECTED Final   Streptococcus pneumoniae NOT DETECTED NOT DETECTED Final   Streptococcus pyogenes NOT DETECTED NOT DETECTED Final   Acinetobacter baumannii NOT DETECTED NOT DETECTED Final   Enterobacteriaceae species DETECTED (A) NOT DETECTED Final    Comment: Enterobacteriaceae represent a large family of gram-negative bacteria, not a single organism. CRITICAL RESULT CALLED TO, READ BACK BY AND VERIFIED WITH: JASON ROBBINS AT 2706 02/08/2019.PMF    Enterobacter cloacae complex NOT DETECTED NOT DETECTED Final   Escherichia coli DETECTED (A) NOT DETECTED Final    Comment: CRITICAL RESULT CALLED TO, READ BACK BY AND VERIFIED WITH: JASON ROBBINS AT 2376 02/08/2019.PMF    Klebsiella oxytoca NOT DETECTED NOT DETECTED Final   Klebsiella pneumoniae NOT DETECTED NOT DETECTED Final   Proteus species NOT DETECTED NOT DETECTED Final   Serratia marcescens NOT DETECTED NOT DETECTED Final   Carbapenem resistance NOT DETECTED NOT DETECTED Final   Haemophilus influenzae NOT DETECTED NOT DETECTED Final   Neisseria meningitidis NOT DETECTED NOT DETECTED Final   Pseudomonas aeruginosa NOT DETECTED NOT DETECTED Final   Candida albicans NOT DETECTED NOT DETECTED Final   Candida glabrata NOT DETECTED NOT DETECTED Final   Candida krusei NOT DETECTED NOT DETECTED Final   Candida parapsilosis NOT DETECTED NOT DETECTED Final   Candida tropicalis NOT DETECTED NOT DETECTED Final    Comment: Performed at [facility], [place], [place] [address]  SARS CORONAVIRUS 2 (TAT 6-24 HRS) Nasopharyngeal Nasopharyngeal Swab     Status: None   Collection Time: 02/08/19  2:38 AM   Specimen: Nasopharyngeal Swab  Result Value Ref Range Status   SARS Coronavirus 2 NEGATIVE NEGATIVE Final    Comment: (NOTE) SARS-CoV-2 target nucleic acids are NOT DETECTED. The SARS-CoV-2  RNA is generally detectable in upper and lower respiratory specimens during the acute phase of infection. Negative results do not preclude SARS-CoV-2 infection, do not rule out co-infections with other pathogens, and should not be used as the sole basis for treatment or other patient management decisions. Negative results must be combined with clinical observations, patient history, and epidemiological information. The expected result is Negative. Fact Sheet for Patients: SugarRoll.be Fact Sheet for Healthcare Providers: https://www.woods-mathews.com/ This test is not yet approved or cleared by the [place] FDA and  has been authorized for detection and/or diagnosis of SARS-CoV-2 by FDA under an Emergency Use Authorization (EUA). This EUA will remain  in effect (meaning this test can be used) for the duration of the COVID-19 declaration under Section 56 4(b)(1) of the Act, 21 U.S.C. section 360bbb-3(b)(1), unless the authorization is terminated or revoked sooner. Performed at [facility], [place] [address] [place], [place] [address]   MRSA PCR Screening     Status: None   Collection Time: 02/09/19 12:51 AM   Specimen: Nasopharyngeal  Result Value Ref Range Status   MRSA by PCR NEGATIVE NEGATIVE Final    Comment:        The GeneXpert MRSA Assay (FDA approved for NASAL specimens only), is one component of a comprehensive MRSA colonization surveillance program. It is not intended to diagnose MRSA infection nor to guide or monitor treatment for MRSA infections. Performed at [facility], [address] [place], [place] [address]          Radiology Studies: No results found.      Scheduled Meds: . acidophilus  1 capsule Oral Daily  . ALPRAZolam  0.5 mg Oral TID  . amoxicillin-clavulanate  1 tablet Oral Q12H  . apixaban  5 mg Oral BID  . furosemide  20 mg Intravenous Q12H  . insulin aspart  0-5 Units  Subcutaneous QHS  . insulin aspart  0-9 Units Subcutaneous TID WC  . metoprolol tartrate  100 mg Oral BID  . multivitamin with minerals  1 tablet Oral Daily  . nystatin cream  1 application Topical BID  . pantoprazole  40 mg Oral BID  . pravastatin  20 mg Oral Daily  . ramipril  5 mg Oral Daily  . traZODone  150 mg Oral QHS  . vitamin B-12  1,000 mcg Oral Daily  . vitamin E  400 Units Oral Daily   Continuous Infusions: . sodium chloride Stopped (02/14/19 0720)     LOS: 9 days    Time spent: 40 minutes    Irine Seal, MD [facility]  If 7PM-7AM, please contact night-coverage [email or web address] 02/17/2019, 12:10 PM

## 2019-02-18 LAB — BASIC METABOLIC PANEL
Anion gap: 9 (ref 5–15)
BUN: 26 mg/dL — ABNORMAL HIGH (ref 8–23)
CO2: 36 mmol/L — ABNORMAL HIGH (ref 22–32)
Calcium: 8.6 mg/dL — ABNORMAL LOW (ref 8.9–10.3)
Chloride: 93 mmol/L — ABNORMAL LOW (ref 98–111)
Creatinine, Ser: 0.77 mg/dL (ref 0.44–1.00)
GFR calc Af Amer: >60 mL/min (ref >60)
GFR calc non Af Amer: >60 mL/min (ref >60)
Glucose, Bld: 120 mg/dL — ABNORMAL HIGH (ref 70–99)
Potassium: 4.5 mmol/L (ref 3.5–5.1)
Sodium: 138 mmol/L (ref 135–145)

## 2019-02-18 LAB — CBC
HCT: 36.5 % (ref 36.0–46.0)
Hemoglobin: 11.3 g/dL — ABNORMAL LOW (ref 12.0–15.0)
MCH: 29.5 pg (ref 26.0–34.0)
MCHC: 31 g/dL (ref 30.0–36.0)
MCV: 95.3 fL (ref 80.0–100.0)
Platelets: 267 10*3/uL (ref 150–400)
RBC: 3.83 MIL/uL — ABNORMAL LOW (ref 3.87–5.11)
RDW: 13.6 % (ref 11.5–15.5)
WBC: 10.1 10*3/uL (ref 4.0–10.5)
nRBC: 0 % (ref 0.0–0.2)

## 2019-02-18 LAB — GLUCOSE, CAPILLARY
Glucose-Capillary: 119 mg/dL — ABNORMAL HIGH (ref 70–99)
Glucose-Capillary: 200 mg/dL — ABNORMAL HIGH (ref 70–99)
Glucose-Capillary: 151 mg/dL — ABNORMAL HIGH (ref 70–99)
Glucose-Capillary: 177 mg/dL — ABNORMAL HIGH (ref 70–99)

## 2019-02-18 LAB — MAGNESIUM: Magnesium: 2 mg/dL (ref 1.7–2.4)

## 2019-02-18 MED ORDER — FUROSEMIDE 10 MG/ML IJ SOLN
20.0000 mg | Freq: Two times a day (BID) | INTRAMUSCULAR | Status: DC
  Administered 2019-02-18 – 2019-02-20 (×4): 20 mg via INTRAVENOUS
  Filled 2019-02-18 (×4): qty 2

## 2019-02-18 MED ORDER — FUROSEMIDE 10 MG/ML IJ SOLN
40.0000 mg | Freq: Once | INTRAMUSCULAR | Status: AC
  Administered 2019-02-18: 40 mg via INTRAVENOUS
  Filled 2019-02-18: qty 4

## 2019-02-18 MED ORDER — FUROSEMIDE 10 MG/ML IJ SOLN: 20.0000 mg | Freq: Once | INTRAMUSCULAR | Status: DC

## 2019-02-18 NOTE — NC FL2 (Signed)
[place] LEVEL OF CARE SCREENING TOOL     IDENTIFICATION  Patient Name: [name] Birthdate: [birth year] Sex: female Admission Date (Current Location): 02/07/2019  [place] and [place] Number:  Engineering geologist and Address:  [facility], [address], [place], [place] [address]      Provider Number: [id number]  Attending Physician Name and Address:  Eugenie Filler, MD  Relative Name and Phone Number:  Sharlett Iles Significant other [phone]    Current Level of Care: Hospital Recommended Level of Care: [place] Prior Approval Number:    Date Approved/Denied:   PASRR Number: [id number]  Discharge Plan: SNF    Current Diagnoses: Patient Active Problem List   Diagnosis Date Noted  . Acute on chronic diastolic CHF (congestive heart failure) ([place]) 02/15/2019  . PNA (pneumonia) 02/15/2019  . E coli bacteremia 02/13/2019  . E. coli UTI 02/13/2019  . Dyspnea   . Diverticulitis of duodenum   . Acute respiratory failure with hypoxia ([place])   . AKI (acute kidney injury) ([place])   . Chronic diastolic CHF (congestive heart failure) ([place])   . Bacteremia due to Gram-negative bacteria 02/09/2019  . Duodenal ulcer perforation ([place]) 02/08/2019  . Chronic leg pain 11/14/2018  . Atherosclerosis of native arteries of the extremities with ulceration ([place]) 08/21/2018  . Lumbar strain, initial encounter 07/18/2018  . Chronic constipation 06/18/2018  . Erythema of breast 04/18/2018  . MRSA cellulitis 03/31/2018  . Advanced directives, counseling/discussion 01/29/2018  . Bilateral hearing loss 01/29/2018  . Spinal stenosis of lumbosacral region 01/08/2018  . Acute right-sided low back pain 12/26/2017  . Leg wound, right, subsequent encounter 10/30/2017  . Systolic murmur 43/15/4008  . De Quervain's tenosynovitis, left 01/09/2017  . Encounter for chronic pain management 02/09/2016  . Paroxysmal atrial fibrillation  (HCC)   . Varicose veins of left lower extremity with both ulcer of calf and inflammation ([place]) 05/22/2015  . Varicose veins of right lower extremity with both ulcer of calf and inflammation ([place]) 01/23/2015  . Diabetes mellitus type 2 with peripheral artery disease ([place])   . Chronic venous insufficiency   . Bleeding from varicose veins of lower extremity, left 02/13/2014  . Diabetic polyneuropathy ([place]) 11/19/2013  . Type 2 diabetes mellitus with neurological manifestations, controlled ([place]) 11/19/2013  . Right shoulder pain 08/19/2013  . Cerumen impaction 04/16/2013  . Abdominal pain 05/14/2012  . Peptic ulcer disease   . Unsteady gait 12/13/2011  . Chronic lower back pain 09/14/2011  . Microcytic anemia 09/06/2010  . Hyperlipidemia 09/06/2010  . GAD (generalized anxiety disorder) 03/15/2010  . Obesity, Class I, BMI 30-34.9 03/15/2010  . Hypertension 03/15/2010  . (HFpEF) heart failure with preserved ejection fraction ([place]) 03/15/2010  . Coronary artery disease     Orientation RESPIRATION BLADDER Height & Weight     Self, Time, Situation, Place  O2(2L) Incontinent Weight: 148 lb 4.8 oz (67.3 kg) Height:  4\' 9"  (144.8 cm)  BEHAVIORAL SYMPTOMS/MOOD NEUROLOGICAL BOWEL NUTRITION STATUS      Continent Diet(Cardiac)  AMBULATORY STATUS COMMUNICATION OF NEEDS Skin   Limited Assist Verbally Normal                       Personal Care Assistance Level of Assistance  Bathing, Dressing, Feeding Bathing Assistance: Limited assistance Feeding assistance: Independent Dressing Assistance: Limited assistance     Functional Limitations Info  Sight, Speech, Hearing Sight Info: Adequate Hearing Info: Impaired Speech Info: Adequate  SPECIAL CARE FACTORS FREQUENCY  PT (By licensed PT), OT (By licensed OT)     PT Frequency: Minimum 5x a week OT Frequency: Minimum 5x a week            Contractures Contractures Info: Not present    Additional Factors Info  Code Status,  Allergies, Psychotropic, Insulin Sliding Scale Code Status Info: Full Code Allergies Info: Metformin And Related Psychotropic Info: ALPRAZolam (XANAX) tablet 0.5 mg Insulin Sliding Scale Info: insulin aspart (novoLOG) injection 0-9 Units 3x a day with meals       Current Medications (02/18/2019):  This is the current hospital active medication list Current Facility-Administered Medications  Medication Dose Route Frequency Provider Last Rate Last Dose  . 0.9 %  sodium chloride infusion   Intravenous PRN Terrilee Croak, MD   Stopped at 02/14/19 0720  . acetaminophen (TYLENOL) tablet 500 mg  500 mg Oral Q6H PRN Mansy, Jan A, MD   500 mg at 02/18/19 1001  . acidophilus (RISAQUAD) capsule 1 capsule  1 capsule Oral Daily Eugenie Filler, MD   1 capsule at 02/18/19 1000  . ALPRAZolam Duanne Moron) tablet 0.5 mg  0.5 mg Oral TID Eugenie Filler, MD   0.5 mg at 02/18/19 1000  . alum & mag hydroxide-simeth (MAALOX/MYLANTA) 200-200-20 MG/5ML suspension 30 mL  30 mL Oral Q6H PRN Eugenie Filler, MD   30 mL at 02/16/19 2041   And  . lidocaine (XYLOCAINE) 2 % viscous mouth solution 15 mL  15 mL Oral Q6H PRN Eugenie Filler, MD   15 mL at 02/15/19 1830  . amoxicillin-clavulanate (AUGMENTIN) 875-125 MG per tablet 1 tablet  1 tablet Oral Q12H Eugenie Filler, MD   1 tablet at 02/18/19 1000  . apixaban (ELIQUIS) tablet 5 mg  5 mg Oral BID Terrilee Croak, MD   5 mg at 02/18/19 1000  . [START ON 02/19/2019] furosemide (LASIX) injection 20 mg  20 mg Intravenous Q12H Eugenie Filler, MD      . HYDROcodone-acetaminophen (NORCO/VICODIN) 5-325 MG per tablet 0.5 tablet  0.5 tablet Oral Q6H PRN Mansy, Jan A, MD   0.5 tablet at 02/18/19 0253  . insulin aspart (novoLOG) injection 0-5 Units  0-5 Units Subcutaneous QHS Mansy, Arvella Merles, MD   3 Units at 02/14/19 2121  . insulin aspart (novoLOG) injection 0-9 Units  0-9 Units Subcutaneous TID WC Mansy, Arvella Merles, MD   2 Units at 02/18/19 1158  . ipratropium-albuterol  (DUONEB) 0.5-2.5 (3) MG/3ML nebulizer solution 3 mL  3 mL Nebulization Q6H PRN Terrilee Croak, MD   3 mL at 02/15/19 0658  . loratadine (CLARITIN) tablet 10 mg  10 mg Oral Daily PRN Mansy, Jan A, MD   10 mg at 02/13/19 0829  . metoprolol tartrate (LOPRESSOR) injection 5 mg  5 mg Intravenous Q6H PRN Terrilee Croak, MD   5 mg at 02/15/19 1843  . metoprolol tartrate (LOPRESSOR) tablet 100 mg  100 mg Oral BID Eugenie Filler, MD   100 mg at 02/18/19 1000  . morphine 2 MG/ML injection 2 mg  2 mg Intravenous Q4H PRN Mansy, Jan A, MD   2 mg at 02/08/19 2207  . multivitamin with minerals tablet 1 tablet  1 tablet Oral Daily Mansy, Jan A, MD   1 tablet at 02/18/19 1000  . nitroGLYCERIN (NITROSTAT) SL tablet 0.4 mg  0.4 mg Sublingual Q5 min PRN Mansy, Jan A, MD      . nystatin cream (MYCOSTATIN) 1 application  1 application Topical BID Eugenie Filler, MD   1 application at 03/27/48 2054  . ondansetron (ZOFRAN) tablet 4 mg  4 mg Oral Q6H PRN Mansy, Jan A, MD       Or  . ondansetron [facility]) injection 4 mg  4 mg Intravenous Q6H PRN Mansy, Jan A, MD      . pantoprazole (PROTONIX) EC tablet 40 mg  40 mg Oral BID Eugenie Filler, MD   40 mg at 02/18/19 1000  . pravastatin (PRAVACHOL) tablet 20 mg  20 mg Oral Daily Mansy, Jan A, MD   20 mg at 02/17/19 1806  . ramipril (ALTACE) capsule 5 mg  5 mg Oral Daily Dahal, Binaya, MD   5 mg at 02/18/19 1000  . traZODone (DESYREL) tablet 150 mg  150 mg Oral QHS Terrilee Croak, MD   150 mg at 02/17/19 2053  . vitamin B-12 (CYANOCOBALAMIN) tablet 1,000 mcg  1,000 mcg Oral Daily Mansy, Jan A, MD   1,000 mcg at 02/18/19 1002  . vitamin E capsule 400 Units  400 Units Oral Daily Mansy, Jan A, MD   400 Units at 02/18/19 1000     Discharge Medications: Please see discharge summary for a list of discharge medications.  Relevant Imaging Results:  Relevant Lab Results:   Additional Information SSN [id number]  Ross Ludwig, LCSW

## 2019-02-18 NOTE — TOC Progression Note (Addendum)
Transition of Care [facility]) - Progression Note    Patient Details  Name: [name] MRN: [id number] Date of Birth: Jul 13, 1927  [facility] CM/SW Contact  Ross Ludwig, [place] Phone Number: 02/18/2019, 12:19 PM  Clinical Narrative:     CSW spoke with patient and she is agreeable to going to SNF for short term rehab.  Patient states she prefers [facility], because it is close to where she lives.  CSW was given permission to speak to her significant other [name] at [phone] to discuss choices.  Patient's information was faxed out to different SNFs awaiting bed offers.  3:30pm  CSW presented bed offers to patient and her significant other and they chose [facility].  CSW spoke to [facility] and said pending negative Covid test, they can accept patient once she is medically ready for discharge.   Expected Discharge Plan: [place] Barriers to Discharge: Continued Medical Work up  Expected Discharge Plan and Services Expected Discharge Plan: [place] arrangements for the past 2 months: Single Family Home                 DME Arranged: N/A         HH Arranged: PT HH Agency: [facility] [employer] Date [place]: 02/12/19 Time [place]: 1400 Representative spoke with at [place]: [place] ([place]) Interventions    Readmission Risk Interventions No flowsheet data found.

## 2019-02-18 NOTE — Progress Notes (Signed)
Physical Therapy Treatment Patient Details Name: [name] MRN: [id number] DOB: 1927/05/25 Today's Date: 02/18/2019    History of Present Illness 85 y.o. Caucasian female with a known history of multiple medical problems that will be mentioned below, including type 2 diabetes mellitus, hypertension, diverticulosis and depression who presented to the emergency room with acute onset of abdominal pain described as sharp and extending from epigastric area to her mid abdomen and right upper quadrant with no nausea or vomiting or fever or chills.    PT Comments    Pt was initially tired and slow to engage with PT but after minimal cuing and time to wake she was eager to do some ambulation and activity with PT.  Pt pleasant t/o the session and showed good effort.  She was slow and labored with mobility, but with consistent cuing and encouragement able to rise to sitting and then standing w/o physical assist (verbal cues and tactile cues still required).  Pt on 3L on arrival with sats of 99%, ambulation on 2L was fatiguing for pt but her SpO2% remained in the mid/high 90s; HR has consistently increased with previous PT effort and this session was no different with HR reaching into 130s with modest bout of ambulation.   Follow Up Recommendations  SNF;Supervision for mobility/OOB     Equipment Recommendations  None recommended by PT    Recommendations for Other Services       Precautions / Restrictions Precautions Precautions: Fall Restrictions Weight Bearing Restrictions: No    Mobility  Bed Mobility Overal bed mobility: Needs Assistance Bed Mobility: Supine to Sit     Supine to sit: Min assist     General bed mobility comments: Pt was able to get to sitting EOB with only very minimal assist   Transfers Overall transfer level: Needs assistance Equipment used: Rolling walker (2 wheeled) Transfers: Sit to/from Stand Sit to Stand: Min guard         General transfer comment:  Pt was able to get herself to standing with only minimal cuing for set up.  Ambulation/Gait Ambulation/Gait assistance: Min assist;Min guard Gait Distance (Feet): 65 Feet Assistive device: Rolling walker (2 wheeled)       General Gait Details: Pt again with elevated HR during ambulation (up to the 130s) but does not endorse excessive fatigue.  She was on 2L t/o the effort with sats staying in the mid/high90s.     Stairs             Wheelchair Mobility    Modified Rankin (Stroke Patients Only)       Balance Overall balance assessment: Needs assistance Sitting-balance support: Feet supported Sitting balance-Leahy Scale: Good Sitting balance - Comments: able to maintain sitting balance safely and w/o assist, good confidence   Standing balance support: Bilateral upper extremity supported;During functional activity Standing balance-Leahy Scale: Fair Standing balance comment: reliant on walker for balance/safety, no LOBs or stagger steps                            Cognition Arousal/Alertness: Awake/alert Behavior During Therapy: WFL for tasks assessed/performed;Impulsive Overall Cognitive Status: Within Functional Limits for tasks assessed                                        Exercises      General Comments  Pertinent Vitals/Pain Pain Assessment: Faces Faces Pain Scale: Hurts a [place] bit Pain Location: hips/low back soreness    Home Living                      Prior Function            PT Goals (current goals can now be found in the care plan section) Progress towards PT goals: Progressing toward goals    Frequency    Min 2X/week      PT Plan Current plan remains appropriate    Co-evaluation              AM-PAC PT "6 Clicks" Mobility   Outcome Measure  Help needed turning from your back to your side while in a flat bed without using bedrails?: A [place] Help needed moving from lying on your  back to sitting on the side of a flat bed without using bedrails?: A [place] Help needed moving to and from a bed to a chair (including a wheelchair)?: A [place] Help needed standing up from a chair using your arms (e.g., wheelchair or bedside chair)?: A Main Help needed to walk in hospital room?: A [name] Help needed climbing 3-5 steps with a railing? : A Lot 6 Click Score: 17    End of Session Equipment Utilized During Treatment: Gait belt;Oxygen Activity Tolerance: Patient tolerated treatment well;Treatment limited secondary to medical complications (Comment);Patient limited by fatigue Patient left: in chair;with nursing/sitter in room;with call bell/phone within reach   PT Visit Diagnosis: Muscle weakness (generalized) (M62.81);Difficulty in walking, not elsewhere classified (R26.2)     Time: 2595-6387 PT Time Calculation (min) (ACUTE ONLY): 26 min  Charges:  $Gait Training: 8-22 mins $Therapeutic Activity: 8-22 mins                     Kreg Shropshire, DPT 02/18/2019, 5:51 PM

## 2019-02-18 NOTE — Care Management Important Message (Signed)
Important Message  Patient Details  Name: [name] MRN: [id number] Date of Birth: [birth year]   Medicare Important Message Given:  Yes     Dannette Barbara 02/18/2019, 12:20 PM

## 2019-02-18 NOTE — Progress Notes (Signed)
PROGRESS NOTE    [name]  PPJ:[id number] DOB: Jan 27, 1928 DOA: 02/07/2019 PCP: Ria Bush, MD    Brief Narrative:  Patient is an 85 year old Caucasian female with PMH of HTN, HLD, DM2, CAD s/p CABG, diastolic CHF, PAD, DVT, chronic venous insufficiency, diverticulosis, hemorrhoids, cystocele, depression. She presented to the ED on 11/5 with complaint of acute abdominal pain extending from epigastric area to her mid abdomen and right upper quadrantwith no nausea or vomiting or fever or chills.   In the ED, patient was afebrile, hemodynamically stable. Work-up showed WBC count elevated to 25.7. Urinalysis showed cloudy amber-colored urine with moderate amount of leukocytes, many bacteria and WBC clumps. CT abdomen pelvis showed fluid-filled outpouching arising from the first portion of the duodenum with some irregular mural thickening and adjacent inflammation, concerning for contained perforation of a duodenal ulcer or inflamed duodenal diverticulum.  Patient was admitted to hospitalist service for further evaluation and management.  Urine culture and Blood culture obtained on admission both showed more than 100,000 CFU per mL of E. coli, pansensitive.   Assessment & Plan:   Principal Problem:   E coli bacteremia Active Problems:   Diverticulitis of duodenum   Acute on chronic diastolic CHF (congestive heart failure) (HCC)   PNA (pneumonia)   Coronary artery disease   Hypertension   Hyperlipidemia   Type 2 diabetes mellitus with neurological manifestations, controlled (HCC)   Diabetes mellitus type 2 with peripheral artery disease (HCC)   Paroxysmal atrial fibrillation (HCC)   Bacteremia due to Gram-negative bacteria   Dyspnea   Acute respiratory failure with hypoxia (HCC)   AKI (acute kidney injury) (HCC)   Chronic diastolic CHF (congestive heart failure) (HCC)   E. coli UTI   1 E. coli bacteremia/E. coli UTI Patient initially had presented with abdominal  pain.  Patient was pancultured and urine cultures and blood cultures positive for E. coli which was pansensitive.  Patient initially was on IV Zosyn and subsequently IV Rocephin and IV Unasyn and now has been transitioned to oral Augmentin to complete a 14-day course of antibiotic treatment.  Follow.  2.  Duodenal diverticulitis Patient seen in consultation by GI and general surgery who recommended medical management with IV antibiotics.  Patient was on IV Unasyn.  Discontinued IV Unasyn and transitioned to oral Augmentin to complete a 10-day course of antibiotic treatment.  Per general surgery no surgical intervention required at this time.  Appreciate GI input and recommendations.  3.  Acute respiratory failure with hypoxia secondary to acute on chronic diastolic CHF. Patient noted not to be on oxygen prior to admission.  Chest x-ray done negative for any acute infiltrates.  Patient noted to have some coarse breath sounds/bibasilar crackles on 02/13/2019.Marland Kitchen  Patient was resumed on home dose oral Lasix at 10 mg daily.  Urine output of 1.250 L over the past 24 hours.  Patient's current weight of 148.3 pounds from 148.4 pounds (02/16/2019) from 155 pounds (02/13/2019) from 143.96 pounds on admission.  Patient received a dose of Lasix 20 mg IV x1 on 02/13/2019.  Patient was still hypoxic and as such a CT angiogram chest was obtained consistent with CHF exacerbation and multifocal infiltrates on the right lobe.  BNP noted to be elevated.  Patient placed on Lasix 20 mg IV every 12 hours and received an extra dose of IV Lasix on 02/17/2019 with a urine output of 1.250 L over the past 24 hours. We will give an additional dose of Lasix 20 mg IV  x1.  Continue diuresis with IV Lasix.  Repeat ambulatory sats tomorrow.  Continue supportive care.  Follow.  4.  Acute kidney injury Creatinine noted to be elevated at 1.6 on day of presentation.  Patient placed on IV fluids with hydration.  Acute kidney injury resolved.   Patient now on diuresis.  Creatinine currently at 0.77.  Monitor renal function closely.   5.  Paroxysmal atrial fibrillation with RVR Patient with a history of A. fib on oral metoprolol 37.5 mg twice daily as well as Eliquis for anticoagulation prior to admission.  On hospital day 2 patient noted to be in A. fib with RVR, likely secondary to acute infection of bacteremia.  Patient initially placed on a Cardizem drip which has subsequently been weaned off.  Continue increased dose of metoprolol 100 mg twice daily.  Eliquis for anticoagulation.  Outpatient follow-up.   6.  Acute on chronic diastolic CHF/coronary artery disease status post CABG/PAD/hypertension/hyperlipidemia Patient noted to still be hypoxic.  Patient did state was not on O2 prior to admission.  Chest x-ray done negative.  CT angiogram chest done concerning for acute CHF exacerbation and possible multi focal pneumonia in the right lobe.  BNP also noted to be elevated at 323.  Patient started on Lasix 20 mg IV every 12 hours on 02/14/2019 with a urine output of 1.250 L over the past 24 hours after receiving an extra dose of IV Lasix yesterday 02/17/2019.  Denies any chest pain. Continue increased dose of metoprolol, ramipril, Lasix, statin and Eliquis.  We will give extra dose of Lasix 20 mg IV x1 today.  May need a higher dose of Lasix on discharge however follow for now.  7.  Well-controlled type 2 diabetes mellitus Hemoglobin A1c 6.9 (02/08/2019).  Patient noted to be on Actos prior to admission. CBG of 119 this morning.  Continue to hold oral hypoglycemic agents.  Continue sliding scale insulin.  8.  Pneumonia Noted on CT angiogram chest during work-up for patient's hypoxia.  Patient afebrile.  Patient with a normal white count.  Patient was on IV antibiotics and subsequently transitioned to oral Augmentin which we will continue.  Patient has been assessed by speech therapy with no signs of aspiration.  Supportive care.   DVT  prophylaxis: Eliquis Code Status: Full Family Communication: Updated patient.  Updated caretaker, [name] at bedside.  Disposition Plan: SNF versus home with home health when clinically improved with improvement/resolution of hypoxia.      Consultants:   General surgery: Dr. Lysle Pearl 02/08/2019  Gastroenterology: Dr. Alice Reichert 02/08/2019  Procedures:   CT abdomen and pelvis 02/07/2019  Chest x-ray 02/12/2019  2D echo 02/10/2019  CT angiogram chest 02/14/2019  Antimicrobials:   IV Unasyn 02/12/2019>>>> 02/14/2019  IV Rocephin 02/08/2019>>>> 02/11/2019  IV Merrem 02/08/2019>>>> 02/09/2019  IV Zosyn 02/07/2019>>>>> 02/08/2019   Augmentin 02/14/2019>>>>> 02/21/2019   Subjective: Patient sitting up in chair.  States she is feeling better.  Wants to ambulate.  Denies any chest pain.  Patient now open to being placed on a skilled nursing facility  Objective: Vitals:   02/17/19 1532 02/17/19 1935 02/18/19 0515 02/18/19 0728  BP: (!) 115/56 116/70 (!) 101/55 125/78  Pulse: 89 68 71 77  Resp: 19 20 18 19   Temp: 98 F (36.7 C) 98.8 F (37.1 C) 98.3 F (36.8 C) 98.2 F (36.8 C)  TempSrc: Oral Oral  Oral  SpO2: 100% 100% 97% 100%  Weight:   67.3 kg   Height:  Intake/Output Summary (Last 24 hours) at 02/18/2019 1058 Last data filed at 02/18/2019 0920 Gross per 24 hour  Intake 840 ml  Output 1250 ml  Net -410 ml   Filed Weights   02/13/19 0452 02/16/19 0523 02/18/19 0515  Weight: 70.5 kg 67.3 kg 67.3 kg    Examination:  General exam: NAD Respiratory system: Improving bibasilar crackles.  Decreased breath sounds in the bases.  No wheezing.  Fair air movement.  Speaking in full sentences.  Cardiovascular system: Irregularly irregular.  No JVD.  Trace bilateral lower extremity edema. Gastrointestinal system: Abdomen is soft, nontender, nondistended, positive bowel sounds.  No rebound.  No guarding.   Central nervous system: Alert and oriented. No focal neurological  deficits. Extremities: Symmetric 5 x 5 power. Skin: No rashes, lesions or ulcers Psychiatry: Judgement and insight appear normal. Mood & affect appropriate.     Data Reviewed: I have personally reviewed following labs and imaging studies  CBC: Recent Labs  Lab 02/14/19 0438 02/15/19 0711 02/16/19 0525 02/17/19 0518 02/18/19 0509  WBC 8.4 8.3 11.2* 9.5 10.1  HGB 11.5* 11.7* 11.6* 11.5* 11.3*  HCT 36.0 37.3 37.0 36.1 36.5  MCV 92.8 93.7 94.6 92.3 95.3  PLT 238 245 264 259 500   Basic Metabolic Panel: Recent Labs  Lab 02/14/19 0438 02/15/19 0711 02/16/19 0525 02/17/19 0518 02/18/19 0509  NA 140 139 138 136 138  K 3.9 3.9 4.5 4.4 4.5  CL 97* 93* 93* 94* 93*  CO2 35* 38* 36* 36* 36*  GLUCOSE 148* 132* 135* 127* 120*  BUN 23 19 19 19  26*  CREATININE 0.62 0.61 0.71 0.63 0.77  CALCIUM 8.7* 8.6* 8.7* 8.5* 8.6*  MG  --   --  1.8 2.3 2.0   GFR: Estimated Creatinine Clearance: 37.7 mL/min (by C-G formula based on SCr of 0.77 mg/dL). Liver Function Tests: No results for input(s): AST, ALT, ALKPHOS, BILITOT, PROT, ALBUMIN in the last 168 hours. No results for input(s): LIPASE, AMYLASE in the last 168 hours. No results for input(s): AMMONIA in the last 168 hours. Coagulation Profile: No results for input(s): INR, PROTIME in the last 168 hours. Cardiac Enzymes: No results for input(s): CKTOTAL, CKMB, CKMBINDEX, TROPONINI in the last 168 hours. BNP (last 3 results) No results for input(s): PROBNP in the last 8760 hours. HbA1C: No results for input(s): HGBA1C in the last 72 hours. CBG: Recent Labs  Lab 02/17/19 0759 02/17/19 1143 02/17/19 1717 02/17/19 2138 02/18/19 0731  GLUCAP 132* 157* 154* 139* 119*   Lipid Profile: No results for input(s): CHOL, HDL, LDLCALC, TRIG, CHOLHDL, LDLDIRECT in the last 72 hours. Thyroid Function Tests: No results for input(s): TSH, T4TOTAL, [name]4, T3FREE, THYROIDAB in the last 72 hours. Anemia Panel: No results for input(s):  VITAMINB12, FOLATE, FERRITIN, TIBC, IRON, RETICCTPCT in the last 72 hours. Sepsis Labs: No results for input(s): PROCALCITON, LATICACIDVEN in the last 168 hours.  Recent Results (from the past 240 hour(s))  MRSA PCR Screening     Status: None   Collection Time: 02/09/19 12:51 AM   Specimen: Nasopharyngeal  Result Value Ref Range Status   MRSA by PCR NEGATIVE NEGATIVE Final    Comment:        The GeneXpert MRSA Assay (FDA approved for NASAL specimens only), is one component of a comprehensive MRSA colonization surveillance program. It is not intended to diagnose MRSA infection nor to guide or monitor treatment for MRSA infections. Performed at [facility], [address], [place], [place] [address]  Radiology Studies: No results found.      Scheduled Meds:  acidophilus  1 capsule Oral Daily   ALPRAZolam  0.5 mg Oral TID   amoxicillin-clavulanate  1 tablet Oral Q12H   apixaban  5 mg Oral BID   furosemide  20 mg Intravenous Q12H   insulin aspart  0-5 Units Subcutaneous QHS   insulin aspart  0-9 Units Subcutaneous TID WC   metoprolol tartrate  100 mg Oral BID   multivitamin with minerals  1 tablet Oral Daily   nystatin cream  1 application Topical BID   pantoprazole  40 mg Oral BID   pravastatin  20 mg Oral Daily   ramipril  5 mg Oral Daily   traZODone  150 mg Oral QHS   vitamin B-12  1,000 mcg Oral Daily   vitamin E  400 Units Oral Daily   Continuous Infusions:  sodium chloride Stopped (02/14/19 0720)     LOS: 10 days    Time spent: 40 minutes    Irine Seal, MD [facility]  If 7PM-7AM, please contact night-coverage [email or web address] 02/18/2019, 10:58 AM

## 2019-02-18 NOTE — Plan of Care (Signed)
°  Problem: Clinical Measurements: °Goal: Respiratory complications will improve °Outcome: Progressing °  °Problem: Pain Managment: °Goal: General experience of comfort will improve °Outcome: Progressing °  °Problem: Safety: °Goal: Ability to remain free from injury will improve °Outcome: Progressing °  °

## 2019-02-19 DIAGNOSIS — I2510 Atherosclerotic heart disease of native coronary artery without angina pectoris: Secondary | ICD-10-CM

## 2019-02-19 DIAGNOSIS — I4891 Unspecified atrial fibrillation: Secondary | ICD-10-CM

## 2019-02-19 DIAGNOSIS — N179 Acute kidney failure, unspecified: Secondary | ICD-10-CM

## 2019-02-19 DIAGNOSIS — I5033 Acute on chronic diastolic (congestive) heart failure: Secondary | ICD-10-CM

## 2019-02-19 LAB — CBC
HCT: 39.7 % (ref 36.0–46.0)
Hemoglobin: 12.5 g/dL (ref 12.0–15.0)
MCH: 29.2 pg (ref 26.0–34.0)
MCHC: 31.5 g/dL (ref 30.0–36.0)
MCV: 92.8 fL (ref 80.0–100.0)
Platelets: 265 10*3/uL (ref 150–400)
RBC: 4.28 MIL/uL (ref 3.87–5.11)
RDW: 13.6 % (ref 11.5–15.5)
WBC: 11.4 10*3/uL — ABNORMAL HIGH (ref 4.0–10.5)
nRBC: 0 % (ref 0.0–0.2)

## 2019-02-19 LAB — BASIC METABOLIC PANEL
Anion gap: 9 (ref 5–15)
BUN: 25 mg/dL — ABNORMAL HIGH (ref 8–23)
CO2: 34 mmol/L — ABNORMAL HIGH (ref 22–32)
Calcium: 8.9 mg/dL (ref 8.9–10.3)
Chloride: 95 mmol/L — ABNORMAL LOW (ref 98–111)
Creatinine, Ser: 0.71 mg/dL (ref 0.44–1.00)
GFR calc Af Amer: >60 mL/min (ref >60)
GFR calc non Af Amer: >60 mL/min (ref >60)
Glucose, Bld: 141 mg/dL — ABNORMAL HIGH (ref 70–99)
Potassium: 4.7 mmol/L (ref 3.5–5.1)
Sodium: 138 mmol/L (ref 135–145)

## 2019-02-19 LAB — GLUCOSE, CAPILLARY
Glucose-Capillary: 144 mg/dL — ABNORMAL HIGH (ref 70–99)
Glucose-Capillary: 226 mg/dL — ABNORMAL HIGH (ref 70–99)
Glucose-Capillary: 122 mg/dL — ABNORMAL HIGH (ref 70–99)
Glucose-Capillary: 146 mg/dL — ABNORMAL HIGH (ref 70–99)

## 2019-02-19 LAB — SARS CORONAVIRUS 2 (TAT 6-24 HRS): SARS Coronavirus 2: NEGATIVE

## 2019-02-19 LAB — MAGNESIUM: Magnesium: 2 mg/dL (ref 1.7–2.4)

## 2019-02-19 MED ORDER — DILTIAZEM HCL 30 MG PO TABS
30.0000 mg | ORAL_TABLET | Freq: Four times a day (QID) | ORAL | Status: DC
  Administered 2019-02-19 – 2019-02-20 (×3): 30 mg via ORAL
  Filled 2019-02-19 (×3): qty 1

## 2019-02-19 NOTE — Consult Note (Signed)
Cardiology Consultation:   [name] MRN: [id number]; DOB: 11-May-1927  Admit date: 02/07/2019 Date of Consult: 02/19/2019  Primary Care Provider: Ria Bush, MD Primary Cardiologist: [name] Moores, MD  Primary Electrophysiologist:  None    Patient Profile:   [name] is a 85 y.o. female with a hx of coronary artery disease status post CABG (1996), persistent atrial fibrillation, hypertension, hyperlipidemia, diabetes mellitus, right lower extremity DVT, PAD with right leg wound, and venous insufficiency, who is being seen today for the evaluation of atrial fibrillation with rapid ventricular response at the request of [name]  History of Present Illness:   [name] presented to the [facility] emergency department on 02/08/2019 complaining of 2 to 3 weeks of abdominal discomfort.  She describes it as a tenderness.  She reported occasional mild diarrhea and loose bowel movements, though she does not endorse this today.  She was found to have marked leukocytosis with a white blood cell count above 25,000.  Acute kidney injury with a creatinine of 1.6 was also noted.  CT of the abdomen revealed fluid-filled outpouching arising from the first portion of the duodenum with irregular mural thickening and adjacent inflammation concerning for contained perforation of a duodenal ulcer or inflamed duodenal diverticulum.  Conservative management was recommended by surgery.  Patient was also found to have E. coli bacteremia.  The patient's hospital course has been complicated by atrial fibrillation/flutter with intermittent elevated ventricular rates.  She also developed shortness of breath/hypoxia felt to be due to acute on chronic HFpEF.  She has been maintained on oral metoprolol and was also on IV diltiazem (now weaned off).  She reports vague discomfort in her chest consisting of a fluttering sensation.  She denies frank chest pain.  She has mild shortness of breath,  which is at her baseline.  She denies edema and lightheadedness.  She was last seen in the office by [name] Dopp, PA, on 01/22/2019.  At that time, she remained in atrial fibrillation and was reluctant to undergo cardioversion.  Therefore, metoprolol tartrate was increased to 37.5 mg twice daily.  She was continued on apixaban.  Heart Pathway Score:     Past Medical History:  Diagnosis Date  . Acute deep vein thrombosis (DVT) of distal vein of right lower extremity ([place]) 12/2013   Acute thrombus of R proximal gastrocnemius vein. Symptomatic provoked distal DVT  . Anxiety   . Cellulitis of leg, left 01/16/2014  . Chronic back pain 03/2012   lumbar DDD with herniation - s/p L S1 transforaminal ESI (10/2012 Dr. Sharlet Salina)  . Chronic venous insufficiency   . Coronary artery disease    CABG 1998  . Cystocele   . Depression   . Diabetes mellitus    type 2, followed by endo.  . Diastolic dysfunction   . Diverticulosis   . Hemorrhoids   . History of heart attack   . Hx of echocardiogram    a. Echo 10/13: mild LVH, EF 55-60%, Gr 1 diast dysfn, PASP 32  . Hyperlipidemia   . Hypertension   . Irritable bowel syndrome   . Microcytic anemia   . PAD (peripheral artery disease) ([place]) 2013   ABI: R 0.91, L 0.83  . Postmenopausal osteoporosis 01/2019   T score of -2.6  . PUD (peptic ulcer disease)   . Varicose veins   . Venous ulcer of leg ([place]) 12/12/2013   Required prolonged treatment by wound center  . Viral gastroenteritis due to [place] virus  05/13/2015   hospitalization    Past Surgical History:  Procedure Laterality Date  . ABDOMINAL HYSTERECTOMY  1975   TAH,BSO  . ABI  2013   R 0.91, L 0.83  . APPENDECTOMY     per pt report  . CARDIOVASCULAR STRESS TEST  2015   low risk myoview ([name])  . CATARACT SURGERY    . CHOLECYSTECTOMY    . COLONOSCOPY  02/2010   extensive diverticulosis throughout, small internal hemorrhoids, rec rpt 5 yrs (Dr. Allyn [name])  . CORONARY ARTERY BYPASS GRAFT   1998  . dexa scan  09/2011   femur -0.8, forearm 0.6 => normal  . ENDOVENOUS ABLATION SAPHENOUS VEIN W/ LASER Left 03/2014   [name]  . ESI  2014   L S1 transforaminal ESI x2 ([name]) without improvement  . HAND SURGERY    . lexiscan myoview  03/2011   Small basal inferolateral and anterolateral reversible perfusion defect suggests ischemia.  EF was normal.   old infarct, no new ischemia  . LOWER EXTREMITY ANGIOGRAPHY Right 09/06/2018   Procedure: LOWER EXTREMITY ANGIOGRAPHY;  Surgeon: [name] Huxley, MD;  Location: [facility];  Service: Cardiovascular;  Laterality: Right;  . toe surgery    . TYMPANOPLASTY  1960s   R side  . Vault prolapse A & P repair  2009   Dr [name] Medications:  Prior to Admission medications   Medication Sig Start Date [name] Date Taking? Authorizing Provider  acetaminophen (TYLENOL) 500 MG tablet Take 500 mg by mouth every 6 (six) hours as needed for mild pain.   Yes [provider]  ALPRAZolam [name]) 1 MG tablet Take 0.5 mg by mouth 4 (four) times daily as needed for anxiety.    Yes [provider]  apixaban (ELIQUIS) 5 MG TABS tablet Take 1 tablet (5 mg total) by mouth 2 (two) times daily. 12/13/18  Yes [name], [name] Cheng, MD  furosemide (LASIX) 20 MG tablet Take 0.5 tablets (10 mg total) by mouth daily. 12/05/18  Yes [name] Bush, MD  glucosamine-chondroitin 500-400 MG tablet Take 1 tablet by mouth daily.    Yes [provider]  glucose blood (ONETOUCH VERIO) test strip USE TO TEST BLOOD SUGAR ONCE DAILY. DX:E11.9 01/21/19  Yes [name] Snare, MD  HYDROcodone-acetaminophen (NORCO/VICODIN) 5-325 MG tablet Take 0.5 tablets by mouth every 6 (six) hours as needed for moderate pain. 01/14/19  Yes [name] Bush, MD  loperamide (IMODIUM) 2 MG capsule Take 2 mg by mouth 4 (four) times daily as needed for diarrhea or loose stools.   Yes [provider]  loratadine (CLARITIN) 10 MG tablet Take 10 mg by  mouth daily as needed for allergies.   Yes [provider]  metoprolol tartrate (LOPRESSOR) 25 MG tablet Take 1.5 tablets (37.5 mg total) by mouth 2 (two) times daily. 01/22/19 04/22/19 Yes Weaver, Scott T, PA-C  Multiple Vitamin (MULTIVITAMIN) tablet Take 1 tablet by mouth daily. Patient reports takes a new vitamin to replace her centrum as recommended by her eye doctor.   Yes [provider]  nystatin cream (MYCOSTATIN) Apply 1 application topically 2 (two) times daily. 09/04/18  Yes [name] Bush, MD  nystatin-triamcinolone ointment (MYCOLOG) APPLY TO AFFECTED AREA TWICE A DAY AS NEEDED FOR ITCHING 01/03/19  Yes Fontaine, Belinda Block, MD  pantoprazole (PROTONIX) 40 MG tablet TAKE 1 TABLET BY MOUTH EVERY DAY 10/22/18  Yes [name] Bush, MD  pioglitazone (ACTOS) 30 MG tablet TAKE 1 TABLET BY  MOUTH EVERY DAY 01/28/19  Yes [name] Snare, MD  polyethylene glycol powder (GLYCOLAX/MIRALAX) powder Take 17 g by mouth daily. Hold for diarrhea 06/18/18  Yes [name] Bush, MD  pravastatin (PRAVACHOL) 20 MG tablet TAKE 1 TABLET BY MOUTH EVERY DAY 02/04/19  Yes [name] Bush, MD  Probiotic Product (ALIGN) 4 MG CAPS Take 1 capsule (4 mg total) by mouth daily. 08/15/17  Yes [name] Bush, MD  ramipril (ALTACE) 5 MG capsule TAKE ONE CAPSULE BY MOUTH EVERYDAY FOR BLOOD PRESSURE 09/01/17  Yes [name] Bush, MD  traZODone (DESYREL) 150 MG tablet Take 150 mg by mouth at bedtime.    Yes [provider]  triamcinolone cream (KENALOG) 0.1 % APPLY TO AFFECTED AREA TWICE A DAY 09/10/18  Yes [name] Bush, MD  vitamin B-12 (CYANOCOBALAMIN) 1000 MCG tablet Take 1,000 mcg by mouth daily.   Yes [provider]  vitamin E 400 UNIT capsule Take 400 Units by mouth daily.   Yes [provider]  collagenase (SANTYL) ointment Apply topically daily. Patient not taking: Reported on 02/15/2019 03/27/18   Bettey Costa, MD  diclofenac sodium (VOLTAREN) 1 % GEL Apply 1 application  topically 3 (three) times daily. Patient not taking: Reported on 02/15/2019 01/09/17   [name] Bush, MD  nitroGLYCERIN (NITROSTAT) 0.4 MG SL tablet Place 1 tablet (0.4 mg total) under the tongue every 5 (five) minutes as needed for chest pain. 02/02/17   [name], [name] Cheng, MD    Inpatient Medications: Scheduled Meds: . acidophilus  1 capsule Oral Daily  . ALPRAZolam  0.5 mg Oral TID  . amoxicillin-clavulanate  1 tablet Oral Q12H  . apixaban  5 mg Oral BID  . furosemide  20 mg Intravenous Q12H  . insulin aspart  0-5 Units Subcutaneous QHS  . insulin aspart  0-9 Units Subcutaneous TID WC  . metoprolol tartrate  100 mg Oral BID  . multivitamin with minerals  1 tablet Oral Daily  . nystatin cream  1 application Topical BID  . pantoprazole  40 mg Oral BID  . pravastatin  20 mg Oral Daily  . ramipril  5 mg Oral Daily  . traZODone  150 mg Oral QHS  . vitamin B-12  1,000 mcg Oral Daily  . vitamin E  400 Units Oral Daily   Continuous Infusions: . sodium chloride Stopped (02/14/19 0720)   PRN Meds: sodium chloride, acetaminophen, alum & mag hydroxide-simeth **AND** lidocaine, HYDROcodone-acetaminophen, ipratropium-albuterol, loratadine, metoprolol tartrate, morphine injection, nitroGLYCERIN, ondansetron **OR** ondansetron (ZOFRAN) IV  Allergies:    Allergies  Allergen Reactions  . Metformin And Related Diarrhea    Social History:   Social History   Socioeconomic History  . Marital status: Single    Spouse name: Not on file  . Number of children: Not on file  . Years of education: Not on file  . Highest education level: Not on file  Occupational History  . Occupation: Waitress   Social Needs  . Financial resource strain: Not hard at all  . Food insecurity    Worry: Never true    Inability: Never true  . Transportation needs    Medical: No    Non-medical: No  Tobacco Use  . Smoking status: Former Smoker    Quit date: 03/05/1979    Years since quitting: 39.9  .  Smokeless tobacco: Never Used  Substance and Sexual Activity  . Alcohol use: No    Alcohol/week: 0.0 standard drinks  . Drug use: No  . Sexual activity: Not Currently  Comment: 1st intercourse 85yo.--- 2 sexual partners  Lifestyle  . Physical activity    Days per week: 0 days    Minutes per session: 0 min  . Stress: Very much  Relationships  . Social connections    Talks on phone: More than three times a week    Gets together: Not on file    Attends religious service: Not on file    Active member of club or organization: Not on file    Attends meetings of clubs or organizations: Not on file    Relationship status: Not on file  . Intimate partner violence    Fear of current or ex partner: No    Emotionally abused: No    Physically abused: No    Forced sexual activity: No  Other Topics Concern  . 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   Independent for all ADLs, lives with a boyfriend.      Family History:   Family History  Problem Relation Age of Onset  . Hypertension Mother   . Stroke Mother   . Diabetes Father   . Diabetes Sister   . Diabetes Brother   . Heart disease Son   . Colon cancer Neg Hx      ROS:  Please see the history of present illness. All other ROS reviewed and negative.     Physical Exam/Data:   Vitals:   02/18/19 2328 02/19/19 0548 02/19/19 0752 02/19/19 1234  BP:  123/71 119/84   Pulse: 60 100 (!) 111 89  Resp:  19 18   Temp:  98.6 F (37 C) 97.8 F (36.6 C)   TempSrc:  Oral Oral   SpO2:  97% 100%   Weight:  49.7 kg    Height:        Intake/Output Summary (Last 24 hours) at 02/19/2019 1515 Last data filed at 02/19/2019 1300 Gross per 24 hour  Intake 1080 ml  Output 1001 ml  Net 79 ml   Last 3 Weights 02/19/2019 02/18/2019 02/16/2019  Weight (lbs) 109 lb 9.1 oz 148 lb 4.8 oz 148 lb 6.4 oz  Weight (kg) 49.7 kg 67.268 kg 67.314 kg     Body mass index is 23.71 kg/m.  General:  Well  nourished, well developed, in no acute distress.  She is accompanied by a friend.  Patient is notably hard of hearing. HEENT: Hard of hearing.  No conjunctival pallor or scleral icterus. Lymph: no adenopathy Neck: No JVD. Endocrine:  No thryomegaly Vascular: No carotid bruits; FA pulses 2+ bilaterally without bruits  Cardiac: Irregular without murmurs. Lungs: Mildly diminished breath sounds at both lung bases.  Normal work of breathing. Abd: soft with mild epigastric tenderness.  No rebound or guarding. Ext: no edema Musculoskeletal:  No deformities, BUE and BLE strength normal and equal Skin: warm and dry  Neuro:  CNs 2-12 intact, no focal abnormalities noted Psych:  Normal affect   EKG:  The EKG performed 02/16/2019 was personally reviewed and demonstrates: Atrial fibrillation with rapid ventricular response. Telemetry:  Telemetry was personally reviewed and demonstrates: Likely atrial flutter with variable conduction, though periods of atrial fibrillation with rapid ventricular response cannot be excluded.  Heart rate has varied from 60 to 140 bpm over the last 24 hours.  Relevant CV Studies: TTE (02/10/2019):  1. Left ventricular ejection fraction, by visual estimation, is 55 to 60%. The left ventricle has normal function. There is mildly increased  left ventricular hypertrophy.  2. Left ventricular diastolic parameters are indeterminate.  3. Global right ventricle has normal systolic function.The right ventricular size is normal. No increase in right ventricular wall thickness.  4. Left atrial size was moderately dilated.  5. Moderate mitral valve regurgitation.  6. Tricuspid valve regurgitation moderate.  7. The aortic valve is normal in structure, unable to exclude bicuspid aortic valve. . Aortic valve regurgitation is not visualized. No evidence of aortic valve sclerosis or stenosis.  8. Moderately elevated pulmonary artery systolic pressure.  9. The tricuspid regurgitant velocity  is 3.26 m/s, and with an assumed right atrial pressure of 10 mmHg, the estimated right ventricular systolic pressure is moderately elevated at 52.4 mmHg. 10. Rhythm is atrial fibrillation  Laboratory Data:  High Sensitivity Troponin:   Recent Labs  Lab 02/15/19 1858  TROPONINIHS 14     Chemistry Recent Labs  Lab 02/17/19 0518 02/18/19 0509 02/19/19 0830  NA 136 138 138  K 4.4 4.5 4.7  CL 94* 93* 95*  CO2 36* 36* 34*  GLUCOSE 127* 120* 141*  BUN 19 26* 25*  CREATININE 0.63 0.77 0.71  CALCIUM 8.5* 8.6* 8.9  GFRNONAA >60 >60 >60  GFRAA >60 >60 >60  ANIONGAP 6 9 9     No results for input(s): PROT, ALBUMIN, AST, ALT, ALKPHOS, BILITOT in the last 168 hours. Hematology Recent Labs  Lab 02/17/19 0518 02/18/19 0509 02/19/19 0830  WBC 9.5 10.1 11.4*  RBC 3.91 3.83* 4.28  HGB 11.5* 11.3* 12.5  HCT 36.1 36.5 39.7  MCV 92.3 95.3 92.8  MCH 29.4 29.5 29.2  MCHC 31.9 31.0 31.5  RDW 13.7 13.6 13.6  PLT 259 267 265   BNP Recent Labs  Lab 02/14/19 1415  BNP 323.0*    DDimer No results for input(s): DDIMER in the last 168 hours.   Radiology/Studies:  No results found.  Assessment and Plan:   Atrial fibrillation/flutter with rapid ventricular response: Likely driven by acute illnesses (duodenal diverticulum versus contained perforation and E. coli bacteremia).  She is somewhat symptomatic with palpitations.  Cardioversion has previously been discussed in the outpatient setting, though the patient has been reluctant to proceed, given that her son passed away 2 years ago following a complication from a cardiac procedure.  Ventricular rate seems to increase predominantly when the patient is active.  Continue apixaban 5 mg twice daily.  Continue metoprolol tartrate 100 mg twice daily.  Add diltiazem 30 mg every 6 hours.  Ultimately, patient would benefit from restoration of sinus rhythm (if she is agreeable).  However, I favor deferring this until she has recovered from  her bacteremia and duodenal diverticulitis.  Coronary artery disease: No signs or symptoms of significant coronary insufficiency.  Continue pravastatin 20 mg daily for secondary prevention.  Patient is on anticoagulation with apixaban in lieu of aspirin.  Acute on chronic HFpEF: Patient developed shortness of breath and evidence of volume overload during this hospitalization, consistent with acute on chronic HFpEF complicated by atrial fibrillation with rapid ventricular response.  She appears euvolemic on exam today.  She is net even over the last 24 hours.  Transition to furosemide 40 mg p.o. daily.  Heart rate control, as above.  Cute kidney injury: Resolved.  For questions or updates, please contact [place] Please consult [email or web address] for contact info under [facility].  Signed, Nelva Bush, MD  02/19/2019 3:16 PM

## 2019-02-19 NOTE — Progress Notes (Signed)
Nutrition Brief Note  Patient identified for LOS day 32  85 year old Caucasian female with PMH of HTN, HLD, DM2, CAD s/p CABG, diastolic CHF, PAD, DVT, chronic venous insufficiency, diverticulosis, hemorrhoids, cystocele, depression. Pt admitted with UTI, PNA, bacteremia, CHF and diverticulitis.   Wt Readings from Last 15 Encounters:  02/19/19 49.7 kg  02/07/19 65.3 kg  01/22/19 65.3 kg  01/08/19 65.5 kg  12/20/18 64.9 kg  12/13/18 64.9 kg  11/14/18 62.8 kg  10/09/18 63 kg  09/06/18 63.5 kg  09/04/18 63.7 kg  08/21/18 61.7 kg  07/18/18 62.6 kg  06/18/18 62.1 kg  04/27/18 63.7 kg  04/18/18 64.3 kg    Body mass index is 23.71 kg/m. Patient meets criteria for normal weight based on current BMI. Per chart, pt is weight stable at baseline.   Current diet order is CHO modified, patient is consuming approximately 100% of meals at this time. Labs and medications reviewed.   No nutrition interventions warranted at this time. If nutrition issues arise, please consult RD.   Koleen Distance MS, RD, LDN Pager #- [phone] Office#- [phone] After Hours Pager: [phone]

## 2019-02-19 NOTE — TOC Progression Note (Signed)
Transition of Care [facility]) - Progression Note    Patient Details  Name: [name] MRN: [id number] Date of Birth: [birth year]  Transition of Care [facility]) CM/SW Contact  Ross Ludwig, [place] Phone Number: 02/19/2019, 4:52 PM  Clinical Narrative:     CSW spoke with patient and her friend who was at bedside.  CSW informed her that [facility] did offer a bed for her pending a negative Covid test.  CSW discussed facilities, and what to expect at [facility] for rehab.  Patient states she is nervous, because of Covid, and also because she is not used to having to have people help her.  Patient states she is normally very independent, and she is struggling to realize she needs some help temporarily in order to get back home.  CSW explained to her that several people go to SNFs for short term rehab, and the plan is to return back home with home health.  Patient states she is also nervous, because she will not be able to have visitors.  CSW explained to her this is due to Covid, patient expressed understanding.  Patient was also reminded it would not be permanent, just enough to get her strength up so she can advance with home health.  Expected Discharge Plan: [place] Barriers to Discharge: Continued Medical Work up  Expected Discharge Plan and Services Expected Discharge Plan: [place] arrangements for the past 2 months: Single Family Home                 DME Arranged: N/A         HH Arranged: PT HH Agency: [facility] [employer] Date [place]: 02/12/19 Time [place]: 1400 Representative spoke with at [place]: [place] ([place]) Interventions    Readmission Risk Interventions No flowsheet data found.

## 2019-02-19 NOTE — Progress Notes (Signed)
Physical Therapy Treatment Patient Details Name: [name] MRN: [id number] DOB: 03/04/1928 Today's Date: 02/19/2019    History of Present Illness 85 y.o. Caucasian female with a known history of multiple medical problems that will be mentioned below, including type 2 diabetes mellitus, hypertension, diverticulosis and depression who presented to the emergency room with acute onset of abdominal pain described as sharp and extending from epigastric area to her mid abdomen and right upper quadrant with no nausea or vomiting or fever or chills.    PT Comments    Pt was very eager to work with PT despite consistent c/o back pain sitting and in standing. On arrival O2 (on 2L) 97%, HR in the 80s.  As soon as we started any mobility/activity her HR rose to ~130 and stayed in this range the entire time in standing/ambulating.  She did not have any overt safety issues and did not endorse excessive fatigue.  Pt's HR staying >100 and generally (>110) during light resisted seated activities with rest breaks.  Pt very motivated and pleasant t/o session, but clearly frustrated with back pain, and that we could not try a longer bout of ambulation.     Follow Up Recommendations  SNF;Supervision for mobility/OOB     Equipment Recommendations  None recommended by PT    Recommendations for Other Services       Precautions / Restrictions Precautions Precautions: Fall Restrictions Weight Bearing Restrictions: No    Mobility  Bed Mobility               General bed mobility comments: in recliner on arrival, returned to recliner  Transfers Overall transfer level: Modified independent Equipment used: Rolling walker (2 wheeled) Transfers: Sit to/from Stand Sit to Stand: Min guard         General transfer comment: Pt did well with getting to standing from recliner, showed limited awareness but no safety issues  Ambulation/Gait Ambulation/Gait assistance: Min guard Gait Distance (Feet):  65 Feet Assistive device: Rolling walker (2 wheeled)       General Gait Details: Pt continues to have elevated HR with most activity.  On arrival HR was below 100, almost immediately with activity it increased to ~130 and stayed 125-135 during the entire bout of ambulation (O2 on 2L was <95%).  Pt with minimal fatigue and eager to walk.  Close supervision, but relatively safe and stable using RW.    Stairs             Wheelchair Mobility    Modified Rankin (Stroke Patients Only)       Balance Overall balance assessment: Needs assistance   Sitting balance-Leahy Scale: Good Sitting balance - Comments: able to maintain sitting balance safely and w/o assist, good confidence     Standing balance-Leahy Scale: Fair Standing balance comment: reliant on walker for balance/safety, no LOBs or stagger steps                            Cognition Arousal/Alertness: Awake/alert Behavior During Therapy: WFL for tasks assessed/performed;Impulsive Overall Cognitive Status: Within Functional Limits for tasks assessed                                        Exercises General Exercises - Lower Extremity Ankle Circles/Pumps: AROM;10 reps Long Arc Quad: Strengthening;10 reps Heel Slides: Strengthening;10 reps Hip ABduction/ADduction: Strengthening;10 reps Straight  Leg Raises: Strengthening;10 reps    General Comments        Pertinent Vitals/Pain Pain Assessment: Faces Faces Pain Scale: Hurts a [place]o bit Pain Location: tail bone/low back    Home Living                      Prior Function            PT Goals (current goals can now be found in the care plan section) Progress towards PT goals: Progressing toward goals    Frequency    Min 2X/week      PT Plan Current plan remains appropriate    Co-evaluation              AM-PAC PT "6 Clicks" Mobility   Outcome Measure  Help needed turning from your back to your side while  in a flat bed without using bedrails?: A Holdren Help needed moving from lying on your back to sitting on the side of a flat bed without using bedrails?: A Lawson Help needed moving to and from a bed to a chair (including a wheelchair)?: A Hands Help needed standing up from a chair using your arms (e.g., wheelchair or bedside chair)?: A Califano Help needed to walk in hospital room?: A Remedios Help needed climbing 3-5 steps with a railing? : A Lot 6 Click Score: 17    End of Session Equipment Utilized During Treatment: Gait belt;Oxygen(2L) Activity Tolerance: Patient tolerated treatment well;Treatment limited secondary to medical complications (Comment);Patient limited by fatigue Patient left: in chair;with nursing/sitter in room;with call bell/phone within reach Nurse Communication: Mobility status(HR during activity) PT Visit Diagnosis: Muscle weakness (generalized) (M62.81);Difficulty in walking, not elsewhere classified (R26.2)     Time: 4158-3094 PT Time Calculation (min) (ACUTE ONLY): 28 min  Charges:  $Gait Training: 8-22 mins $Therapeutic Exercise: 8-22 mins                     Kreg Shropshire, DPT 02/19/2019, 4:51 PM

## 2019-02-19 NOTE — Plan of Care (Signed)
  Problem: Health Behavior/Discharge Planning: Goal: Ability to manage health-related needs will improve Outcome: Progressing   Problem: Clinical Measurements: Goal: Ability to maintain clinical measurements within normal limits will improve Outcome: Progressing Goal: Respiratory complications will improve Outcome: Progressing   Problem: Activity: Goal: Risk for activity intolerance will decrease Outcome: Progressing   

## 2019-02-19 NOTE — Progress Notes (Signed)
PROGRESS NOTE    [name]  YQI:[id number] DOB: 11-Jun-1927 DOA: 02/07/2019 PCP: Ria Bush, MD    Brief Narrative:  Patient is an 85 year old Caucasian female with PMH of HTN, HLD, DM2, CAD s/p CABG, diastolic CHF, PAD, DVT, chronic venous insufficiency, diverticulosis, hemorrhoids, cystocele, depression. She presented to the ED on 11/5 with complaint of acute abdominal pain extending from epigastric area to her mid abdomen and right upper quadrantwith no nausea or vomiting or fever or chills.   In the ED, patient was afebrile, hemodynamically stable. Work-up showed WBC count elevated to 25.7. Urinalysis showed cloudy amber-colored urine with moderate amount of leukocytes, many bacteria and WBC clumps. CT abdomen pelvis showed fluid-filled outpouching arising from the first portion of the duodenum with some irregular mural thickening and adjacent inflammation, concerning for contained perforation of a duodenal ulcer or inflamed duodenal diverticulum.  Patient was admitted to hospitalist service for further evaluation and management.  Urine culture and Blood culture obtained on admission both showed more than 100,000 CFU per mL of E. coli, pansensitive.   Assessment & Plan:   Principal Problem:   E coli bacteremia Active Problems:   Diverticulitis of duodenum   Acute on chronic diastolic CHF (congestive heart failure) (HCC)   PNA (pneumonia)   Coronary artery disease   Hypertension   Hyperlipidemia   Type 2 diabetes mellitus with neurological manifestations, controlled (HCC)   Diabetes mellitus type 2 with peripheral artery disease (HCC)   Paroxysmal atrial fibrillation (HCC)   Bacteremia due to Gram-negative bacteria   Dyspnea   Acute respiratory failure with hypoxia (HCC)   AKI (acute kidney injury) (HCC)   Chronic diastolic CHF (congestive heart failure) (HCC)   E. coli UTI   1 E. coli bacteremia/E. coli UTI Patient initially had presented with abdominal  pain.  Patient was pancultured and urine cultures and blood cultures positive for E. coli which was pansensitive.  Patient initially was on IV Zosyn and subsequently IV Rocephin and IV Unasyn and now has been transitioned to oral Augmentin to complete a 14-day course of antibiotic treatment.  Follow.  2.  Duodenal diverticulitis Patient seen in consultation by GI and general surgery who recommended medical management with IV antibiotics.  Patient was on IV Unasyn.  Discontinued IV Unasyn and transitioned to oral Augmentin to complete a 10-day course of antibiotic treatment.  Per general surgery no surgical intervention required at this time.  Appreciate GI input and recommendations.  3.  Acute respiratory failure with hypoxia secondary to acute on chronic diastolic CHF. Patient noted not to be on oxygen prior to admission.  Chest x-ray done negative for any acute infiltrates.  Patient noted to have some coarse breath sounds/bibasilar crackles on 02/13/2019.Marland Kitchen  Patient was resumed on home dose oral Lasix at 10 mg daily.  Urine output of 1.201 L over the past 24 hours.  Patient's current weight of 109.57 pounds from 148.3 pounds from 148.4 pounds (02/16/2019) from 155 pounds (02/13/2019) from 143.96 pounds on admission.  Patient received a dose of Lasix 20 mg IV x1 on 02/13/2019.  Patient was still hypoxic early on in the hospitalization and as such a CT angiogram chest was obtained consistent with CHF exacerbation and multifocal infiltrates on the right lobe.  BNP noted to be elevated.  Patient placed on Lasix 20 mg IV every 12 hours and received an extra dose of IV Lasix on 02/17/2019 with a urine output of 1.250 L over the past 24 hours.  Patient received  extra dose of Lasix 20 mg IV x1 on 02/18/2019.  Patient with a urine output of 1.2 L over the past 24 hours.  Repeat ambulatory sats.  Will likely need a higher dose of diuretics on discharge.  Cardiology consulted.   4.  Acute kidney injury Creatinine  noted to be elevated at 1.6 on day of presentation.  Patient placed on IV fluids with hydration.  Acute kidney injury resolved.  Patient now on diuresis.  Creatinine currently at 0.71.  Monitor renal function closely.   5.  Paroxysmal atrial fibrillation with RVR Patient with a history of A. fib on oral metoprolol 37.5 mg twice daily as well as Eliquis for anticoagulation prior to admission.  On hospital day 2 patient noted to be in A. fib with RVR, likely secondary to acute infection of bacteremia.  Patient initially placed on a Cardizem drip which has subsequently been weaned off.  Patient with heart rates in the 130s on ambulation with PT.  Metoprolol dose increased to 100 mg twice daily.  May need to consider adding low-dose Cardizem.  Continue Eliquis for anticoagulation.  Consult with cardiology for further evaluation and management.    6.  Acute on chronic diastolic CHF/coronary artery disease status post CABG/PAD/hypertension/hyperlipidemia Patient noted to still be hypoxic.  Patient did state was not on O2 prior to admission.  Chest x-ray done negative.  CT angiogram chest done concerning for acute CHF exacerbation and possible multi focal pneumonia in the right lobe.  BNP also noted to be elevated at 323.  Patient started on Lasix 20 mg IV every 12 hours on 02/14/2019 with a urine output of 1.2 L over the past 24 hours.  Denies any chest pain. Continue increased dose of metoprolol, ramipril, Lasix, statin and Eliquis.  Will likely need a higher dose of oral Lasix on discharge.  Cardiology consulted.   7.  Well-controlled type 2 diabetes mellitus Hemoglobin A1c 6.9 (02/08/2019).  Patient noted to be on Actos prior to admission. CBG of 144 this morning.  Continue to hold oral hypoglycemic agents.  Continue sliding scale insulin.  8.  Pneumonia Noted on CT angiogram chest during work-up for patient's hypoxia.  Patient afebrile.  Patient with a normal white count.  Patient was on IV antibiotics and  subsequently transitioned to oral Augmentin which we will continue.  Patient has been assessed by speech therapy with no signs of aspiration.  Supportive care.   DVT prophylaxis: Eliquis Code Status: Full Family Communication: Updated patient. Disposition Plan: SNF when clinically improved with improvement/resolution of hypoxia.      Consultants:   General surgery: Dr. Lysle Pearl 02/08/2019  Gastroenterology: Dr. Alice Reichert 02/08/2019  Cardiology pending  Procedures:   CT abdomen and pelvis 02/07/2019  Chest x-ray 02/12/2019  2D echo 02/10/2019  CT angiogram chest 02/14/2019  Antimicrobials:   IV Unasyn 02/12/2019>>>> 02/14/2019  IV Rocephin 02/08/2019>>>> 02/11/2019  IV Merrem 02/08/2019>>>> 02/09/2019  IV Zosyn 02/07/2019>>>>> 02/08/2019   Augmentin 02/14/2019>>>>> 02/21/2019   Subjective: Patient sitting up in chair.  States shortness of breath improving.  Denies any chest pain.  Wants to ambulate with help today.  Patient noted to have worked with PT with heart rates in the 130s on minimal ambulation while on O2.  Objective: Vitals:   02/18/19 2105 02/18/19 2328 02/19/19 0548 02/19/19 0752  BP: 106/63  123/71 119/84  Pulse: 63 60 100 (!) 111  Resp: 19  19 18   Temp: 98.4 F (36.9 C)  98.6 F (37 C) 97.8 F (  36.6 C)  TempSrc: Oral  Oral Oral  SpO2: 99%  97% 100%  Weight:   49.7 kg   Height:        Intake/Output Summary (Last 24 hours) at 02/19/2019 1202 Last data filed at 02/19/2019 0900 Gross per 24 hour  Intake 1200 ml  Output 1201 ml  Net -1 ml   Filed Weights   02/16/19 0523 02/18/19 0515 02/19/19 0548  Weight: 67.3 kg 67.3 kg 49.7 kg    Examination:  General exam: NAD Respiratory system: Improved bibasilar crackles.  No wheezing.  Fair air movement.  Speaking in full sentences.  Cardiovascular system: Irregularly irregular.  Trace lower extremity edema.  No JVD.   Gastrointestinal system: Abdomen is soft, nontender, nondistended, positive bowel sounds.   No rebound.  No guarding.  Central nervous system: Alert and oriented. No focal neurological deficits. Extremities: Symmetric 5 x 5 power. Skin: No rashes, lesions or ulcers Psychiatry: Judgement and insight appear normal. Mood & affect appropriate.     Data Reviewed: I have personally reviewed following labs and imaging studies  CBC: Recent Labs  Lab 02/15/19 0711 02/16/19 0525 02/17/19 0518 02/18/19 0509 02/19/19 0830  WBC 8.3 11.2* 9.5 10.1 11.4*  HGB 11.7* 11.6* 11.5* 11.3* 12.5  HCT 37.3 37.0 36.1 36.5 39.7  MCV 93.7 94.6 92.3 95.3 92.8  PLT 245 264 259 267 242   Basic Metabolic Panel: Recent Labs  Lab 02/15/19 0711 02/16/19 0525 02/17/19 0518 02/18/19 0509 02/19/19 0830  NA 139 138 136 138 138  K 3.9 4.5 4.4 4.5 4.7  CL 93* 93* 94* 93* 95*  CO2 38* 36* 36* 36* 34*  GLUCOSE 132* 135* 127* 120* 141*  BUN 19 19 19  26* 25*  CREATININE 0.61 0.71 0.63 0.77 0.71  CALCIUM 8.6* 8.7* 8.5* 8.6* 8.9  MG  --  1.8 2.3 2.0 2.0   GFR: Estimated Creatinine Clearance: 32.4 mL/min (by C-G formula based on SCr of 0.71 mg/dL). Liver Function Tests: No results for input(s): AST, ALT, ALKPHOS, BILITOT, PROT, ALBUMIN in the last 168 hours. No results for input(s): LIPASE, AMYLASE in the last 168 hours. No results for input(s): AMMONIA in the last 168 hours. Coagulation Profile: No results for input(s): INR, PROTIME in the last 168 hours. Cardiac Enzymes: No results for input(s): CKTOTAL, CKMB, CKMBINDEX, TROPONINI in the last 168 hours. BNP (last 3 results) No results for input(s): PROBNP in the last 8760 hours. HbA1C: No results for input(s): HGBA1C in the last 72 hours. CBG: Recent Labs  Lab 02/18/19 1128 02/18/19 1709 02/18/19 2101 02/19/19 0753 02/19/19 1140  GLUCAP 200* 151* 177* 144* 226*   Lipid Profile: No results for input(s): CHOL, HDL, LDLCALC, TRIG, CHOLHDL, LDLDIRECT in the last 72 hours. Thyroid Function Tests: No results for input(s): TSH, T4TOTAL,  [name]4, T3FREE, THYROIDAB in the last 72 hours. Anemia Panel: No results for input(s): VITAMINB12, FOLATE, FERRITIN, TIBC, IRON, RETICCTPCT in the last 72 hours. Sepsis Labs: No results for input(s): PROCALCITON, LATICACIDVEN in the last 168 hours.  Recent Results (from the past 240 hour(s))  SARS CORONAVIRUS 2 (TAT 6-24 HRS) Nasopharyngeal Nasopharyngeal Swab     Status: None   Collection Time: 02/18/19  5:15 PM   Specimen: Nasopharyngeal Swab  Result Value Ref Range Status   SARS Coronavirus 2 NEGATIVE NEGATIVE Final    Comment: (NOTE) SARS-CoV-2 target nucleic acids are NOT DETECTED. The SARS-CoV-2 RNA is generally detectable in upper and lower respiratory specimens during the acute phase of infection. Negative  results do not preclude SARS-CoV-2 infection, do not rule out co-infections with other pathogens, and should not be used as the sole basis for treatment or other patient management decisions. Negative results must be combined with clinical observations, patient history, and epidemiological information. The expected result is Negative. Fact Sheet for Patients: SugarRoll.be Fact Sheet for Healthcare Providers: https://www.woods-mathews.com/ This test is not yet approved or cleared by the [place] FDA and  has been authorized for detection and/or diagnosis of SARS-CoV-2 by FDA under an Emergency Use Authorization (EUA). This EUA will remain  in effect (meaning this test can be used) for the duration of the COVID-19 declaration under Section 56 4(b)(1) of the Act, 21 U.S.C. section 360bbb-3(b)(1), unless the authorization is terminated or revoked sooner. Performed at [facility], [place] [address] [place], [place] [address]          Radiology Studies: No results found.      Scheduled Meds:  acidophilus  1 capsule Oral Daily   ALPRAZolam  0.5 mg Oral TID   amoxicillin-clavulanate  1 tablet Oral Q12H    apixaban  5 mg Oral BID   furosemide  20 mg Intravenous Q12H   insulin aspart  0-5 Units Subcutaneous QHS   insulin aspart  0-9 Units Subcutaneous TID WC   metoprolol tartrate  100 mg Oral BID   multivitamin with minerals  1 tablet Oral Daily   nystatin cream  1 application Topical BID   pantoprazole  40 mg Oral BID   pravastatin  20 mg Oral Daily   ramipril  5 mg Oral Daily   traZODone  150 mg Oral QHS   vitamin B-12  1,000 mcg Oral Daily   vitamin E  400 Units Oral Daily   Continuous Infusions:  sodium chloride Stopped (02/14/19 0720)     LOS: 11 days    Time spent: 40 minutes    Irine Seal, MD [facility]  If 7PM-7AM, please contact night-coverage [email or web address] 02/19/2019, 12:02 PM

## 2019-02-20 ENCOUNTER — Telehealth: Payer: Self-pay | Admitting: Cardiovascular Disease

## 2019-02-20 DIAGNOSIS — I480 Paroxysmal atrial fibrillation: Secondary | ICD-10-CM | POA: Diagnosis not present

## 2019-02-20 DIAGNOSIS — I739 Peripheral vascular disease, unspecified: Secondary | ICD-10-CM | POA: Diagnosis not present

## 2019-02-20 DIAGNOSIS — R2689 Other abnormalities of gait and mobility: Secondary | ICD-10-CM | POA: Diagnosis not present

## 2019-02-20 DIAGNOSIS — I5030 Unspecified diastolic (congestive) heart failure: Secondary | ICD-10-CM | POA: Diagnosis not present

## 2019-02-20 DIAGNOSIS — I4891 Unspecified atrial fibrillation: Secondary | ICD-10-CM | POA: Diagnosis not present

## 2019-02-20 DIAGNOSIS — E1142 Type 2 diabetes mellitus with diabetic polyneuropathy: Secondary | ICD-10-CM | POA: Diagnosis not present

## 2019-02-20 DIAGNOSIS — R0902 Hypoxemia: Secondary | ICD-10-CM | POA: Diagnosis not present

## 2019-02-20 DIAGNOSIS — N179 Acute kidney failure, unspecified: Secondary | ICD-10-CM | POA: Diagnosis not present

## 2019-02-20 DIAGNOSIS — B9620 Unspecified Escherichia coli [E. coli] as the cause of diseases classified elsewhere: Secondary | ICD-10-CM | POA: Diagnosis not present

## 2019-02-20 DIAGNOSIS — R279 Unspecified lack of coordination: Secondary | ICD-10-CM | POA: Diagnosis not present

## 2019-02-20 DIAGNOSIS — D72829 Elevated white blood cell count, unspecified: Secondary | ICD-10-CM | POA: Diagnosis not present

## 2019-02-20 DIAGNOSIS — M2550 Pain in unspecified joint: Secondary | ICD-10-CM | POA: Diagnosis not present

## 2019-02-20 DIAGNOSIS — D509 Iron deficiency anemia, unspecified: Secondary | ICD-10-CM | POA: Diagnosis not present

## 2019-02-20 DIAGNOSIS — R7881 Bacteremia: Secondary | ICD-10-CM | POA: Diagnosis not present

## 2019-02-20 DIAGNOSIS — R2681 Unsteadiness on feet: Secondary | ICD-10-CM | POA: Diagnosis not present

## 2019-02-20 DIAGNOSIS — E46 Unspecified protein-calorie malnutrition: Secondary | ICD-10-CM | POA: Diagnosis not present

## 2019-02-20 DIAGNOSIS — E785 Hyperlipidemia, unspecified: Secondary | ICD-10-CM | POA: Diagnosis not present

## 2019-02-20 DIAGNOSIS — J189 Pneumonia, unspecified organism: Secondary | ICD-10-CM | POA: Diagnosis not present

## 2019-02-20 DIAGNOSIS — I5032 Chronic diastolic (congestive) heart failure: Secondary | ICD-10-CM | POA: Diagnosis not present

## 2019-02-20 DIAGNOSIS — J9601 Acute respiratory failure with hypoxia: Secondary | ICD-10-CM | POA: Diagnosis not present

## 2019-02-20 DIAGNOSIS — Z7401 Bed confinement status: Secondary | ICD-10-CM | POA: Diagnosis not present

## 2019-02-20 DIAGNOSIS — E1151 Type 2 diabetes mellitus with diabetic peripheral angiopathy without gangrene: Secondary | ICD-10-CM | POA: Diagnosis not present

## 2019-02-20 DIAGNOSIS — E669 Obesity, unspecified: Secondary | ICD-10-CM | POA: Diagnosis not present

## 2019-02-20 DIAGNOSIS — E1149 Type 2 diabetes mellitus with other diabetic neurological complication: Secondary | ICD-10-CM | POA: Diagnosis not present

## 2019-02-20 DIAGNOSIS — I10 Essential (primary) hypertension: Secondary | ICD-10-CM | POA: Diagnosis not present

## 2019-02-20 DIAGNOSIS — F329 Major depressive disorder, single episode, unspecified: Secondary | ICD-10-CM | POA: Diagnosis not present

## 2019-02-20 DIAGNOSIS — F411 Generalized anxiety disorder: Secondary | ICD-10-CM | POA: Diagnosis not present

## 2019-02-20 DIAGNOSIS — R531 Weakness: Secondary | ICD-10-CM | POA: Diagnosis not present

## 2019-02-20 DIAGNOSIS — N390 Urinary tract infection, site not specified: Secondary | ICD-10-CM | POA: Diagnosis not present

## 2019-02-20 DIAGNOSIS — I5033 Acute on chronic diastolic (congestive) heart failure: Secondary | ICD-10-CM | POA: Diagnosis not present

## 2019-02-20 DIAGNOSIS — I2510 Atherosclerotic heart disease of native coronary artery without angina pectoris: Secondary | ICD-10-CM | POA: Diagnosis not present

## 2019-02-20 DIAGNOSIS — R488 Other symbolic dysfunctions: Secondary | ICD-10-CM | POA: Diagnosis not present

## 2019-02-20 DIAGNOSIS — M4807 Spinal stenosis, lumbosacral region: Secondary | ICD-10-CM | POA: Diagnosis not present

## 2019-02-20 DIAGNOSIS — M6281 Muscle weakness (generalized): Secondary | ICD-10-CM | POA: Diagnosis not present

## 2019-02-20 LAB — BASIC METABOLIC PANEL
Anion gap: 9 (ref 5–15)
BUN: 38 mg/dL — ABNORMAL HIGH (ref 8–23)
CO2: 36 mmol/L — ABNORMAL HIGH (ref 22–32)
Calcium: 9.2 mg/dL (ref 8.9–10.3)
Chloride: 95 mmol/L — ABNORMAL LOW (ref 98–111)
Creatinine, Ser: 1.01 mg/dL — ABNORMAL HIGH (ref 0.44–1.00)
GFR calc Af Amer: 57 mL/min — ABNORMAL LOW (ref >60)
GFR calc non Af Amer: 49 mL/min — ABNORMAL LOW (ref >60)
Glucose, Bld: 142 mg/dL — ABNORMAL HIGH (ref 70–99)
Potassium: 4.4 mmol/L (ref 3.5–5.1)
Sodium: 140 mmol/L (ref 135–145)

## 2019-02-20 LAB — GLUCOSE, CAPILLARY
Glucose-Capillary: 132 mg/dL — ABNORMAL HIGH (ref 70–99)
Glucose-Capillary: 225 mg/dL — ABNORMAL HIGH (ref 70–99)
Glucose-Capillary: 140 mg/dL — ABNORMAL HIGH (ref 70–99)

## 2019-02-20 MED ORDER — DILTIAZEM HCL ER COATED BEADS 120 MG PO CP24
120.0000 mg | ORAL_CAPSULE | Freq: Every day | ORAL | Status: DC
  Administered 2019-02-20: 120 mg via ORAL
  Filled 2019-02-20: qty 1

## 2019-02-20 MED ORDER — DILTIAZEM HCL ER COATED BEADS 120 MG PO CP24: 120.0000 mg | ORAL_CAPSULE | Freq: Every day | ORAL | 2 refills | Status: DC

## 2019-02-20 MED ORDER — PANTOPRAZOLE SODIUM 40 MG PO TBEC: 40.0000 mg | DELAYED_RELEASE_TABLET | Freq: Two times a day (BID) | ORAL | 0 refills | Status: DC

## 2019-02-20 MED ORDER — METOPROLOL TARTRATE 50 MG PO TABS: 50.0000 mg | ORAL_TABLET | Freq: Two times a day (BID) | ORAL | Status: DC

## 2019-02-20 MED ORDER — METOPROLOL TARTRATE 50 MG PO TABS: 50.0000 mg | ORAL_TABLET | Freq: Two times a day (BID) | ORAL | 2 refills | Status: DC

## 2019-02-20 MED ORDER — RAMIPRIL 5 MG PO CAPS: 5.0000 mg | ORAL_CAPSULE | Freq: Every day | ORAL | 0 refills | Status: DC

## 2019-02-20 MED ORDER — METOPROLOL TARTRATE 100 MG PO TABS: 100.0000 mg | ORAL_TABLET | Freq: Two times a day (BID) | ORAL | 0 refills | Status: DC

## 2019-02-20 MED ORDER — FUROSEMIDE 20 MG PO TABS: 40.0000 mg | ORAL_TABLET | Freq: Every day | ORAL | 0 refills | Status: DC

## 2019-02-20 MED ORDER — ALPRAZOLAM 1 MG PO TABS: 0.5000 mg | ORAL_TABLET | Freq: Four times a day (QID) | ORAL | 0 refills | Status: DC | PRN

## 2019-02-20 MED ORDER — AMOXICILLIN-POT CLAVULANATE 875-125 MG PO TABS: 1.0000 | ORAL_TABLET | Freq: Two times a day (BID) | ORAL | 0 refills | Status: DC

## 2019-02-20 MED ORDER — HYDROCODONE-ACETAMINOPHEN 5-325 MG PO TABS: 0.5000 | ORAL_TABLET | Freq: Four times a day (QID) | ORAL | 0 refills | Status: DC | PRN

## 2019-02-20 MED ORDER — DILTIAZEM HCL 30 MG PO TABS: 30.0000 mg | ORAL_TABLET | Freq: Four times a day (QID) | ORAL | 0 refills | Status: DC

## 2019-02-20 NOTE — TOC Transition Note (Signed)
Transition of Care [facility]) - CM/SW Discharge Note   Patient Details  Name: [name] MRN: [id number] Date of Birth: 1928/02/28  Transition of Care [facility] CM/SW Contact:  Ross Ludwig, LCSW Phone Number: 02/20/2019, 1:21 PM   Clinical Narrative:     Patient to be d/c'ed today to [facility]  Patient and family agreeable to plans will transport via ems RN to call report [phone].  CSW notified patient's significant other [name] at [phone] that patient will be discharging today.  Final next level of care: Skilled Nursing Facility Barriers to Discharge: Barriers Resolved   Patient Goals and CMS Choice Patient states their goals for this hospitalization and ongoing recovery are:: To go to SNF for short term rehab, then return back home. CMS Medicare.gov Compare Post Acute Care list provided to:: Patient Choice offered to / list presented to : Patient  Discharge Placement PASRR number recieved: 02/18/19            Patient chooses bed at: [facility] Patient to be transferred to facility by: [facility] Name of family member notified: [name] patient's significant other [phone] Patient and family notified of of transfer: 02/20/19  Discharge Plan and Services     Post Acute Care Choice: Home Health          DME Arranged: N/A DME Agency: NA       HH Arranged: PT [place] Agency: NA Date HH Agency Contacted: 02/12/19 Time HH Agency Contacted: 1400 Representative spoke with at [place]: na  Social Determinants of Health ([place]) Interventions     Readmission Risk Interventions No flowsheet data found.

## 2019-02-20 NOTE — Discharge Summary (Addendum)
Physician Discharge Summary  MILANNI AYUB [id number]:[id number] DOB: 01-02-1928 DOA: 02/07/2019  PCP: Ria Bush, MD  Admit date: 02/07/2019 Discharge date: 02/20/2019  Admitted From: Home Disposition:  [facility]  Recommendations for Outpatient Follow-up:  1. Follow up with PCP in 1-2 weeks 2. Please obtain BMP/CBC in one week 3. Please follow up with cardiology for A-fib   Home Health: No Equipment/Devices: N/A  Discharge Condition: Stable CODE STATUS: FULL Diet recommendation: Heart Healthy / Carb Modified     Brief/Interim Summary:  85 year old Caucasian female with PMH of HTN, HLD, DM2, CAD s/p CABG, diastolic CHF, PAD, DVT, chronic venous insufficiency, diverticulosis, hemorrhoids, cystocele, depression.  She presented to the ED on 11/5 with complaint of acute abdominal pain extending from epigastric area to her mid abdomen and right upper quadrantwith no nausea or vomiting or fever or chills.  In the ED, patient was afebrile, hemodynamically stable.  Work-up showed WBC count elevated to 25.7.  Urinalysis showed cloudy amber-colored urine with moderate amount of leukocytes, many bacteria and WBC clumps.  CT abdomen pelvis showed fluid-filled outpouching arising from the first portion of the duodenum with some irregular mural thickening and adjacent inflammation, concerning for contained perforation of a duodenal ulcer or inflamed duodenal diverticulum.  Patient was admitted to hospitalistservice for further evaluation and management.  Urine culture and Blood culture obtained on admission both showed more than 100,000 CFU per mL of E. coli, pansensitive.  Has transitioned to oral Augmentin to complete therapy, with 2 more days remaining on discharge.  Hospital course complicated by A-fib with RVR and decompensated CHF.  Cardiology was consulted and patients medications optimized as outlined below.  Patient to follow up with cardiology for consideration of eventual  cardioversion.   Discharge Diagnoses: Principal Problem:   E coli bacteremia Active Problems:   Coronary artery disease   Hypertension   Hyperlipidemia   Type 2 diabetes mellitus with neurological manifestations, controlled (HCC)   Diabetes mellitus type 2 with peripheral artery disease (HCC)   Paroxysmal atrial fibrillation (HCC)   Bacteremia due to Gram-negative bacteria   Dyspnea   Diverticulitis of duodenum   Acute respiratory failure with hypoxia (HCC)   AKI (acute kidney injury) ([place])   Chronic diastolic CHF (congestive heart failure) (HCC)   E. coli UTI   Acute on chronic diastolic CHF (congestive heart failure) (HCC)   PNA (pneumonia)  1 E. coli bacteremia/E. coli UTI Patient initially had presented with abdominal pain.  Patient was pancultured and urine cultures and blood cultures positive for E. coli which was pansensitive.  Patient initially was on IV Zosyn and subsequently IV Rocephin and IV Unasyn and now has been transitioned to oral Augmentin to complete a 14-day course of antibiotic treatment.  Follow.  2.  Duodenal diverticulitis Patient seen in consultation by GI and general surgery who recommended medical management with IV antibiotics.  Patient was on IV Unasyn.  Discontinued IV Unasyn and transitioned to oral Augmentin to complete a 10-day course of antibiotic treatment.  Per general surgery no surgical intervention required at this time.  Appreciate GI input and recommendations.  3.  Acute respiratory failure with hypoxia secondary to acute on chronic diastolic CHF. Patient noted not to be on oxygen prior to admission.  Chest x-ray done negative for any acute infiltrates.  Patient noted to have some coarse breath sounds/bibasilar crackles on 02/13/2019.Marland Kitchen  Patient was resumed on home dose oral Lasix at 10 mg daily.  Urine output of 1.201 L over the  past 24 hours.  Patient's current weight of 109.57 pounds from 148.3 pounds from 148.4 pounds (02/16/2019) from 155  pounds (02/13/2019) from 143.96 pounds on admission.  Patient received a dose of Lasix 20 mg IV x1 on 02/13/2019.  Patient was still hypoxic early on in the hospitalization and as such a CT angiogram chest was obtained consistent with CHF exacerbation and multifocal infiltrates on the right lobe.  BNP noted to be elevated.  Patient placed on Lasix 20 mg IV every 12 hours and received an extra dose of IV Lasix on 02/17/2019 with a urine output of 1.250 L over the past 24 hours.  Patient received extra dose of Lasix 20 mg IV x1 on 02/18/2019.  Patient with a urine output of 1.2 L over the past 24 hours.  Repeat ambulatory sats.  Will likely need a higher dose of diuretics on discharge.  Cardiology consulted.   4.  Acute kidney injury - resolved Creatinine noted to be elevated at 1.6 on day of presentation.  Patient placed on IV fluids with hydration.  Acute kidney injury resolved.    5.  Paroxysmal atrial fibrillation with RVR - now rate controlled Patient with a history of A. fib on oral metoprolol 37.5 mg twice daily as well as Eliquis for anticoagulation prior to admission.  On hospital day 2 patient noted to be in A. fib with RVR, likely secondary to acute infection of bacteremia.  Patient initially placed on a Cardizem drip which has subsequently been weaned off.  Patient with heart rates in the 130s on ambulation with PT.  Metoprolol dose increased to 100 mg twice daily. Cardizem added and patient tolerating.  Continue Eliquis for anticoagulation.  Consult with cardiology.  Outpatient followup for possible cardioversion.  6.  Acute on chronic diastolic CHF/coronary artery disease status post CABG/PAD/hypertension/hyperlipidemia Patient noted to still be hypoxic.  Patient did state was not on O2 prior to admission.  Chest x-ray done negative.  CT angiogram chest done concerning for acute CHF exacerbation and possible multi focal pneumonia in the right lobe.  BNP also noted to be elevated at 323.   Patient started on Lasix 20 mg IV every 12 hours on 02/14/2019 with a urine output of 1.2 L over the past 24 hours.  Denies any chest pain. Continue increased dose of metoprolol, ramipril, Lasix, statin and Eliquis.  Will likely need a higher dose of oral Lasix on discharge.  Cardiology consulted.   7.  Well-controlled type 2 diabetes mellitus Hemoglobin A1c 6.9 (02/08/2019).  Patient noted to be on Actos prior to admission. CBG of 144 this morning.  Continue to hold oral hypoglycemic agents.  Continue sliding scale insulin.  8.  Pneumonia - suspected, resolved Noted on CT angiogram chest during work-up for patient's hypoxia.  Patient afebrile.  Patient with a normal white count.  Patient was on IV antibiotics and subsequently transitioned to oral Augmentin which we will continue.  Patient has been assessed by speech therapy with no signs of aspiration.  Supportive care.   Discharge Instructions   Discharge Instructions    (HEART FAILURE PATIENTS) Call MD:  Anytime you have any of the following symptoms: 1) 3 pound weight gain in 24 hours or 5 pounds in 1 week 2) shortness of breath, with or without a dry hacking cough 3) swelling in the hands, feet or stomach 4) if you have to sleep on extra pillows at night in order to breathe.   Complete by: As directed  Call MD for:  persistant dizziness or light-headedness   Complete by: As directed    Call MD for:  temperature >100.4   Complete by: As directed    Diet - low sodium heart healthy   Complete by: As directed    Discharge instructions   Complete by: As directed    Take antibiotic (Augmentin) twice daily for 2 more days to complete full course of treatment.   Increase activity slowly   Complete by: As directed      Allergies as of 02/20/2019      Reactions   Metformin And Related Diarrhea      Medication List    TAKE these medications   acetaminophen 500 MG tablet Commonly known as: TYLENOL Take 500 mg by mouth every 6 (six)  hours as needed for mild pain.   Align 4 MG Caps Take 1 capsule (4 mg total) by mouth daily.   ALPRAZolam 1 MG tablet Commonly known as: XANAX Take 0.5 tablets (0.5 mg total) by mouth 4 (four) times daily as needed for anxiety.   amoxicillin-clavulanate 875-125 MG tablet Commonly known as: AUGMENTIN Take 1 tablet by mouth every 12 (twelve) hours.   apixaban 5 MG Tabs tablet Commonly known as: Eliquis Take 1 tablet (5 mg total) by mouth 2 (two) times daily.   collagenase ointment Commonly known as: SANTYL Apply topically daily.   diclofenac sodium 1 % Gel Commonly known as: VOLTAREN Apply 1 application topically 3 (three) times daily.   diltiazem 120 MG 24 hr capsule Commonly known as: CARDIZEM CD Take 1 capsule (120 mg total) by mouth daily.   furosemide 20 MG tablet Commonly known as: LASIX Take 2 tablets (40 mg total) by mouth daily. What changed: how much to take   glucosamine-chondroitin 500-400 MG tablet Take 1 tablet by mouth daily.   HYDROcodone-acetaminophen 5-325 MG tablet Commonly known as: NORCO/VICODIN Take 0.5 tablets by mouth every 6 (six) hours as needed for moderate pain.   loperamide 2 MG capsule Commonly known as: IMODIUM Take 2 mg by mouth 4 (four) times daily as needed for diarrhea or loose stools.   loratadine 10 MG tablet Commonly known as: CLARITIN Take 10 mg by mouth daily as needed for allergies.   metoprolol tartrate 50 MG tablet Commonly known as: LOPRESSOR Take 1 tablet (50 mg total) by mouth 2 (two) times daily. What changed:   medication strength  how much to take   multivitamin tablet Take 1 tablet by mouth daily. Patient reports takes a new vitamin to replace her centrum as recommended by her eye doctor.   nitroGLYCERIN 0.4 MG SL tablet Commonly known as: NITROSTAT Place 1 tablet (0.4 mg total) under the tongue every 5 (five) minutes as needed for chest pain.   nystatin cream Commonly known as: MYCOSTATIN Apply 1  application topically 2 (two) times daily.   nystatin-triamcinolone ointment Commonly known as: MYCOLOG APPLY TO AFFECTED AREA TWICE A DAY AS NEEDED FOR ITCHING   OneTouch Verio test strip Generic drug: glucose blood USE TO TEST BLOOD SUGAR ONCE DAILY. DX:E11.9   pantoprazole 40 MG tablet Commonly known as: PROTONIX Take 1 tablet (40 mg total) by mouth 2 (two) times daily. What changed: when to take this   pioglitazone 30 MG tablet Commonly known as: ACTOS TAKE 1 TABLET BY MOUTH EVERY DAY   polyethylene glycol powder 17 GM/SCOOP powder Commonly known as: GLYCOLAX/MIRALAX Take 17 g by mouth daily. Hold for diarrhea   pravastatin 20 MG tablet  Commonly known as: PRAVACHOL TAKE 1 TABLET BY MOUTH EVERY DAY   ramipril 5 MG capsule Commonly known as: ALTACE TAKE ONE CAPSULE BY MOUTH EVERYDAY FOR BLOOD PRESSURE What changed: Another medication with the same name was added. Make sure you understand how and when to take each.   ramipril 5 MG capsule Commonly known as: ALTACE Take 1 capsule (5 mg total) by mouth daily. Start taking on: February 21, 2019 What changed: You were already taking a medication with the same name, and this prescription was added. Make sure you understand how and when to take each.   traZODone 150 MG tablet Commonly known as: DESYREL Take 150 mg by mouth at bedtime.   triamcinolone cream 0.1 % Commonly known as: KENALOG APPLY TO AFFECTED AREA TWICE A DAY   vitamin B-12 1000 MCG tablet Commonly known as: CYANOCOBALAMIN Take 1,000 mcg by mouth daily.   vitamin E 400 UNIT capsule Take 400 Units by mouth daily.            Durable Medical Equipment  (From admission, onward)         Start     Ordered   02/14/19 1357  For home use only DME oxygen  Once    Question Answer Comment  Length of Need Lifetime   Mode or (Route) Nasal cannula   Liters per Minute 2   Frequency Continuous (stationary and portable oxygen unit needed)   Oxygen  conserving device Yes   Oxygen delivery system Gas      02/14/19 1357          Contact information for follow-up providers    Nahser, Wonda Cheng, MD Follow up.   Specialty: Cardiology Why: message has been sent to our office to schedule TCM in 7 days Contact information: [place] [address] [phone]            Contact information for after-discharge care    Destination    [facility] .   Service: Skilled Nursing Contact information: [address] [place] [phone]                 Allergies  Allergen Reactions  . Metformin And Related Diarrhea    Consultations:  Cardiology    Procedures/Studies: Ct Abdomen Pelvis Wo Contrast  Result Date: 02/07/2019 CLINICAL DATA:  Acute generalized abdominal pain, fevers chills and right-sided abdominal/flank pain EXAM: CT ABDOMEN AND PELVIS WITHOUT CONTRAST TECHNIQUE: Multidetector CT imaging of the abdomen and pelvis was performed following the standard protocol without IV contrast. COMPARISON:  CT abdomen pelvis 07/30/2012 FINDINGS: Lower chest: Streaky atelectatic changes are present in the lung bases. Moderate cardiomegaly with four-chamber enlargement. Mitral annular and aortic leaflet calcifications are present. Coronary artery calcifications are present. No pericardial effusion. Hepatobiliary: No focal liver abnormality is seen. Patient is post cholecystectomy. Slight prominence of the biliary tree likely related to reservoir effect. No calcified intraductal gallstones. Pancreas: Unremarkable. No pancreatic ductal dilatation or surrounding inflammatory changes. Spleen: Normal in size without focal abnormality. Adrenals/Urinary Tract: Stable nodular thickening of the adrenal glands without discrete concerning adrenal nodule. Kidneys are unremarkable, without renal calculi, suspicious lesion, or hydronephrosis. Bladder is slightly  displaced rightward though this configuration is similar to comparison CT from 2014. No other bladder abnormality. Stomach/Bowel: Distal esophagus and stomach are unremarkable. There is a fluid-filled outpouching arising from the first portion of the duodenum with some irregular mural thickening and adjacent inflammation (2/34,  5/43). No small bowel dilatation or wall thickening. The appendix appears surgically absent. There is extensive pancolonic diverticulosis without evidence of acute peridiverticular inflammation. No colonic dilatation or wall thickening. Vascular/Lymphatic: Extensive atherosclerotic plaque of the aorta and branch vessels. Tortuosity of the abdominal aorta, a likely senescent change. No suspicious or enlarged lymph nodes in the included lymphatic chains. Reactive appearing upper abdominal lymph nodes are present. Reproductive: Uterus is surgically absent. No concerning adnexal lesions. Other: No abdominopelvic free fluid or free gas. No bowel containing hernias. Small fat containing umbilical hernia mild body wall edema. Significant pelvic floor laxity with likely cyst seal. Musculoskeletal: No acute osseous abnormality or suspicious osseous lesion. Multilevel degenerative changes are present in the imaged portions of the spine. There is dextrocurvature of the thoracolumbar spine centered at the thoracolumbar junction. Sclerotic changes of the SI joints predominantly ileal side may reflect sequela of prior osteitis condensans. IMPRESSION: 1. Fluid-filled outpouching arising from the first portion of the duodenum with some irregular mural thickening and adjacent inflammation, concerning for contained perforation of a duodenal ulcer or inflamed duodenal diverticulum. 2. Extensive noninflamed pancolonic diverticulosis. 3. Pelvic floor laxity with likely cystocele. 4. Aortic Atherosclerosis (ICD10-I70.0). 5. Cardiomegaly and four-chamber cardiac enlargement. Coronary atherosclerosis. Electronically  Signed   By: Lovena Le M.D.   On: 02/07/2019 23:23   Dg Chest 2 View  Result Date: 02/12/2019 CLINICAL DATA:  Dyspnea EXAM: CHEST - 2 VIEW COMPARISON:  09/27/2015 FINDINGS: Postop CABG with cardiac enlargement unchanged. Negative for edema. Small left effusion. Mild atelectasis in the lung bases. IMPRESSION: Mild bibasilar atelectasis. Small left effusion. Negative for pulmonary edema. Electronically Signed   By: Franchot Gallo M.D.   On: 02/12/2019 15:12   Ct Angio Chest Pe W Or Wo Contrast  Result Date: 02/14/2019 CLINICAL DATA:  85 year old female with history of hypoxemia. Evaluate for pulmonary embolism. EXAM: CT ANGIOGRAPHY CHEST WITH CONTRAST TECHNIQUE: Multidetector CT imaging of the chest was performed using the standard protocol during bolus administration of intravenous contrast. Multiplanar CT image reconstructions and MIPs were obtained to evaluate the vascular anatomy. CONTRAST:  71mL OMNIPAQUE IOHEXOL 350 MG/ML SOLN COMPARISON:  Chest CT 01/05/2014. FINDINGS: Cardiovascular: No filling defects are noted within the pulmonary arterial tree to suggest underlying pulmonary embolism. Heart size is mildly enlarged. There is no significant pericardial fluid, thickening or pericardial calcification. There is aortic atherosclerosis, as well as atherosclerosis of the great vessels of the mediastinum and the coronary arteries, including calcified atherosclerotic plaque in the left main, left anterior descending, left circumflex and right coronary arteries. Status post median sternotomy for CABG including LIMA to the LAD. Mediastinum/Nodes: No pathologically enlarged mediastinal or hilar lymph nodes. Esophagus is unremarkable in appearance. No axillary lymphadenopathy. Lungs/Pleura: Small bilateral pleural effusions lying dependently. Mild diffuse interlobular septal thickening throughout the mid to lower lungs bilaterally, suggesting a background of mild interstitial pulmonary edema. There are a few  patchy areas of ground-glass attenuation in the right lung, most confluent near the apex of the right upper lobe, concerning for potential multifocal infection. No such findings are noted in the left lung. No definite suspicious appearing pulmonary nodules or masses are otherwise noted. Upper Abdomen: Aortic atherosclerosis. Musculoskeletal: Median sternotomy wires. There are no aggressive appearing lytic or blastic lesions noted in the visualized portions of the skeleton. Review of the MIP images confirms the above findings. IMPRESSION: 1. No evidence of pulmonary embolism. 2. The appearance of the chest suggests congestive heart failure, as detailed above. In addition, there is  asymmetrically distributed areas of ill-defined airspace consolidation throughout the right lung, concerning for concurrent multilobar bronchopneumonia. 3. Aortic atherosclerosis, in addition to left main and 3 vessel coronary artery disease. Status post median sternotomy for CABG including LIMA to the LAD. Aortic Atherosclerosis (ICD10-I70.0). Electronically Signed   By: Vinnie Langton M.D.   On: 02/14/2019 15:13       ECHO 02/10/2019 - EF 55-60%   Subjective: Patient seen and examined, up in chair.  Updated her boyfriend by phone who is agreeable to [facility] today.  Patient denies SOB, chest pain, cough, fever/chills or other acute complaints.  States worried about going to facility but understands need for therapy to get strong and remain independent.    Discharge Exam: Vitals:   02/20/19 0755 02/20/19 0840  BP: (!) 120/58   Pulse: 67   Resp: 19   Temp: 98.3 F (36.8 C)   SpO2: 100% 95%   Vitals:   02/20/19 0102 02/20/19 0420 02/20/19 0755 02/20/19 0840  BP: (!) 104/53 99/60 (!) 120/58   Pulse: 68 66 67   Resp:  18 19   Temp:  98.7 F (37.1 C) 98.3 F (36.8 C)   TempSrc:      SpO2:  99% 100% 95%  Weight:  61.9 kg    Height:        General: Pt is alert, awake, not in acute distress Cardiovascular:  RRR, S1/S2 +, no rubs, no gallops Respiratory: CTA bilaterally, no wheezing, no rhonchi Abdominal: Soft, NT, ND, bowel sounds + Extremities: no edema, no cyanosis    The results of significant diagnostics from this hospitalization (including imaging, microbiology, ancillary and laboratory) are listed below for reference.     Microbiology: Recent Results (from the past 240 hour(s))  SARS CORONAVIRUS 2 (TAT 6-24 HRS) Nasopharyngeal Nasopharyngeal Swab     Status: None   Collection Time: 02/18/19  5:15 PM   Specimen: Nasopharyngeal Swab  Result Value Ref Range Status   SARS Coronavirus 2 NEGATIVE NEGATIVE Final    Comment: (NOTE) SARS-CoV-2 target nucleic acids are NOT DETECTED. The SARS-CoV-2 RNA is generally detectable in upper and lower respiratory specimens during the acute phase of infection. Negative results do not preclude SARS-CoV-2 infection, do not rule out co-infections with other pathogens, and should not be used as the sole basis for treatment or other patient management decisions. Negative results must be combined with clinical observations, patient history, and epidemiological information. The expected result is Negative. Fact Sheet for Patients: SugarRoll.be Fact Sheet for Healthcare Providers: https://www.woods-mathews.com/ This test is not yet approved or cleared by the [place] FDA and  has been authorized for detection and/or diagnosis of SARS-CoV-2 by FDA under an Emergency Use Authorization (EUA). This EUA will remain  in effect (meaning this test can be used) for the duration of the COVID-19 declaration under Section 56 4(b)(1) of the Act, 21 U.S.C. section 360bbb-3(b)(1), unless the authorization is terminated or revoked sooner. Performed at [facility], [place] [address] [place], [place] [address]      Labs: BNP (last 3 results) Recent Labs    02/14/19 1415  BNP 542.7*   Basic Metabolic  Panel: Recent Labs  Lab 02/16/19 0525 02/17/19 0518 02/18/19 0509 02/19/19 0830 02/20/19 0411  NA 138 136 138 138 140  K 4.5 4.4 4.5 4.7 4.4  CL 93* 94* 93* 95* 95*  CO2 36* 36* 36* 34* 36*  GLUCOSE 135* 127* 120* 141* 142*  BUN 19 19 26* 25*  38*  CREATININE 0.71 0.63 0.77 0.71 1.01*  CALCIUM 8.7* 8.5* 8.6* 8.9 9.2  MG 1.8 2.3 2.0 2.0  --    Liver Function Tests: No results for input(s): AST, ALT, ALKPHOS, BILITOT, PROT, ALBUMIN in the last 168 hours. No results for input(s): LIPASE, AMYLASE in the last 168 hours. No results for input(s): AMMONIA in the last 168 hours. CBC: Recent Labs  Lab 02/15/19 0711 02/16/19 0525 02/17/19 0518 02/18/19 0509 02/19/19 0830  WBC 8.3 11.2* 9.5 10.1 11.4*  HGB 11.7* 11.6* 11.5* 11.3* 12.5  HCT 37.3 37.0 36.1 36.5 39.7  MCV 93.7 94.6 92.3 95.3 92.8  PLT 245 264 259 267 265   Cardiac Enzymes: No results for input(s): CKTOTAL, CKMB, CKMBINDEX, TROPONINI in the last 168 hours. BNP: Invalid input(s): POCBNP CBG: Recent Labs  Lab 02/19/19 0753 02/19/19 1140 02/19/19 1637 02/19/19 2135 02/20/19 0811  GLUCAP 144* 226* 122* 146* 132*   D-Dimer No results for input(s): DDIMER in the last 72 hours. Hgb A1c No results for input(s): HGBA1C in the last 72 hours. Lipid Profile No results for input(s): CHOL, HDL, LDLCALC, TRIG, CHOLHDL, LDLDIRECT in the last 72 hours. Thyroid function studies No results for input(s): TSH, T4TOTAL, T3FREE, THYROIDAB in the last 72 hours.  Invalid input(s): [name]3 Anemia work up No results for input(s): VITAMINB12, FOLATE, FERRITIN, TIBC, IRON, RETICCTPCT in the last 72 hours. Urinalysis    Component Value Date/Time   COLORURINE AMBER (A) 02/07/2019 2129   APPEARANCEUR CLOUDY (A) 02/07/2019 2129   LABSPEC 1.014 02/07/2019 2129   PHURINE 5.0 02/07/2019 2129   GLUCOSEU NEGATIVE 02/07/2019 2129   GLUCOSEU NEGATIVE 09/04/2018 1346   HGBUR SMALL (A) 02/07/2019 2129   BILIRUBINUR NEGATIVE 02/07/2019  2129   BILIRUBINUR negative 01/08/2016 1504   KETONESUR NEGATIVE 02/07/2019 2129   PROTEINUR NEGATIVE 02/07/2019 2129   UROBILINOGEN 0.2 09/04/2018 1346   NITRITE NEGATIVE 02/07/2019 2129   LEUKOCYTESUR MODERATE (A) 02/07/2019 2129   Sepsis Labs Invalid input(s): PROCALCITONIN,  WBC,  LACTICIDVEN Microbiology Recent Results (from the past 240 hour(s))  SARS CORONAVIRUS 2 (TAT 6-24 HRS) Nasopharyngeal Nasopharyngeal Swab     Status: None   Collection Time: 02/18/19  5:15 PM   Specimen: Nasopharyngeal Swab  Result Value Ref Range Status   SARS Coronavirus 2 NEGATIVE NEGATIVE Final    Comment: (NOTE) SARS-CoV-2 target nucleic acids are NOT DETECTED. The SARS-CoV-2 RNA is generally detectable in upper and lower respiratory specimens during the acute phase of infection. Negative results do not preclude SARS-CoV-2 infection, do not rule out co-infections with other pathogens, and should not be used as the sole basis for treatment or other patient management decisions. Negative results must be combined with clinical observations, patient history, and epidemiological information. The expected result is Negative. Fact Sheet for Patients: SugarRoll.be Fact Sheet for Healthcare Providers: https://www.woods-mathews.com/ This test is not yet approved or cleared by the [place] FDA and  has been authorized for detection and/or diagnosis of SARS-CoV-2 by FDA under an Emergency Use Authorization (EUA). This EUA will remain  in effect (meaning this test can be used) for the duration of the COVID-19 declaration under Section 56 4(b)(1) of the Act, 21 U.S.C. section 360bbb-3(b)(1), unless the authorization is terminated or revoked sooner. Performed at [facility], [place] [address], [place], [place] [address]      Time coordinating discharge: Over 30 minutes  SIGNED:   Ezekiel Slocumb, DO [facility] 02/20/2019, 11:58 AM Pager  [phone]  If 7PM-7AM,  please contact night-coverage [email or web address] Password [email or web address]

## 2019-02-20 NOTE — Progress Notes (Signed)
Attempted to call report to [facility], placed on hold for 14 minutes. Will re-attempt, EMS called at this time for transport.

## 2019-02-20 NOTE — Telephone Encounter (Signed)
-----   Message from Rise Mu, PA-C sent at 02/20/2019 11:43 AM EST ----- She will need TCM follow up in 1 week. Thanks!

## 2019-02-20 NOTE — Progress Notes (Addendum)
Progress Note  Patient Name: [name] Date of Encounter: 02/20/2019  Primary Cardiologist: Nahser  Subjective   No chest pain. Dyspnea improved. In atrial flutter with rates in the 60s bpm this morning. Tolerating medications. Potassium 4.4. BUN/SCr 25/0.71-->38/1.01.  Inpatient Medications    Scheduled Meds:  acidophilus  1 capsule Oral Daily   ALPRAZolam  0.5 mg Oral TID   amoxicillin-clavulanate  1 tablet Oral Q12H   apixaban  5 mg Oral BID   diltiazem  30 mg Oral Q6H   furosemide  20 mg Intravenous Q12H   insulin aspart  0-5 Units Subcutaneous QHS   insulin aspart  0-9 Units Subcutaneous TID WC   metoprolol tartrate  100 mg Oral BID   multivitamin with minerals  1 tablet Oral Daily   nystatin cream  1 application Topical BID   pantoprazole  40 mg Oral BID   pravastatin  20 mg Oral Daily   ramipril  5 mg Oral Daily   traZODone  150 mg Oral QHS   vitamin B-12  1,000 mcg Oral Daily   vitamin E  400 Units Oral Daily   Continuous Infusions:  sodium chloride Stopped (02/14/19 0720)   PRN Meds: sodium chloride, acetaminophen, alum & mag hydroxide-simeth **AND** lidocaine, HYDROcodone-acetaminophen, ipratropium-albuterol, loratadine, metoprolol tartrate, morphine injection, nitroGLYCERIN, ondansetron **OR** ondansetron (ZOFRAN) IV   Vital Signs    Vitals:   02/20/19 0102 02/20/19 0420 02/20/19 0755 02/20/19 0840  BP: (!) 104/53 99/60 (!) 120/58   Pulse: 68 66 67   Resp:  18 19   Temp:  98.7 F (37.1 C) 98.3 F (36.8 C)   TempSrc:      SpO2:  99% 100% 95%  Weight:  61.9 kg    Height:        Intake/Output Summary (Last 24 hours) at 02/20/2019 1133 Last data filed at 02/20/2019 0420 Gross per 24 hour  Intake 600 ml  Output 1000 ml  Net -400 ml   Filed Weights   02/18/19 0515 02/19/19 0548 02/20/19 0420  Weight: 67.3 kg 49.7 kg 61.9 kg    Telemetry    Atrial flutter, 60s bpm - Personally Reviewed  ECG    No new tracings -  Personally Reviewed  Physical Exam   GEN: No acute distress.   Neck: No JVD. Cardiac: RRR, II/VI systolic murmur at the apex, no rubs, or gallops.  Respiratory: Diminished breath sounds bilateral bases.  GI: Soft, nontender, non-distended.   MS: No edema; No deformity. Neuro:  Alert and oriented x 3; Nonfocal.  Psych: Normal affect.  Labs    Chemistry Recent Labs  Lab 02/18/19 0509 02/19/19 0830 02/20/19 0411  NA 138 138 140  K 4.5 4.7 4.4  CL 93* 95* 95*  CO2 36* 34* 36*  GLUCOSE 120* 141* 142*  BUN 26* 25* 38*  CREATININE 0.77 0.71 1.01*  CALCIUM 8.6* 8.9 9.2  GFRNONAA >60 >60 49*  GFRAA >60 >60 57*  ANIONGAP 9 9 9      Hematology Recent Labs  Lab 02/17/19 0518 02/18/19 0509 02/19/19 0830  WBC 9.5 10.1 11.4*  RBC 3.91 3.83* 4.28  HGB 11.5* 11.3* 12.5  HCT 36.1 36.5 39.7  MCV 92.3 95.3 92.8  MCH 29.4 29.5 29.2  MCHC 31.9 31.0 31.5  RDW 13.7 13.6 13.6  PLT 259 267 265    Cardiac EnzymesNo results for input(s): TROPONINI in the last 168 hours. No results for input(s): TROPIPOC in the last 168 hours.  BNP Recent Labs  Lab 02/14/19 1415  BNP 323.0*     DDimer No results for input(s): DDIMER in the last 168 hours.   Radiology    No results found.  Cardiac Studies   2D echo 02/10/2019: 1. Left ventricular ejection fraction, by visual estimation, is 55 to 60%. The left ventricle has normal function. There is mildly increased left ventricular hypertrophy.  2. Left ventricular diastolic parameters are indeterminate.  3. Global right ventricle has normal systolic function.The right ventricular size is normal. No increase in right ventricular wall thickness.  4. Left atrial size was moderately dilated.  5. Moderate mitral valve regurgitation.  6. Tricuspid valve regurgitation moderate.  7. The aortic valve is normal in structure, unable to exclude bicuspid aortic valve. . Aortic valve regurgitation is not visualized. No evidence of aortic valve  sclerosis or stenosis.  8. Moderately elevated pulmonary artery systolic pressure.  9. The tricuspid regurgitant velocity is 3.26 m/s, and with an assumed right atrial pressure of 10 mmHg, the estimated right ventricular systolic pressure is moderately elevated at 52.4 mmHg. 10. Rhythm is atrial fibrillation  Patient Profile     85 y.o. female with history of CAD s/p CABG (1996), persistent Afib on Eiquis, hypertension, hyperlipidemia, diabetes mellitus, right lower extremity DVT, PAD with right leg wound, and venous insufficiency, who is being seen today for the evaluation of atrial fibrillation with rapid ventricular response at the request of Dr. Grandville Silos.  Assessment & Plan    1. Persistent Afib with RVR/atrial flutter: -She is in atrial flutter this morning with rates in the 60s bpm -Decrease metoprolol to 50 mg bid -Consolidate diltiazem to long acting 120 mg daily -Continue Eliquis 5 mg bid -Ultimately, she would benefit from restoring sinus rhythm, though this has been hampered by her refusal in the setting of her son having an adverse event following a cardiac procedure -Nonetheless, she would need to fully recover from her acute illness with E coli bacteremia prior to consideration of restoring sinus rhythm  2. CAD s/p CABG: -No symptoms suggestive of ACS -On Eliquis in place of ASA -Continue statin   3. Acute on chronic HFpEF: -Volume status improved -Has been transitioned to Lasix 40 mg daily -Recommend adequate ventricular rate and ultimate restoration of sinus rhythm as above  4. AKI: -Slight bump in renal function today -Gentle hydration -Follow up labs when she is seen in the office  For questions or updates, please contact [place] Please consult [email or web address] for contact info under Cardiology/STEMI.    Signed, Christell Faith, PA-C [place] Pager: [phone] 02/20/2019, 11:33 AM

## 2019-02-20 NOTE — Plan of Care (Signed)

## 2019-02-20 NOTE — Telephone Encounter (Signed)
TCM....  Patient is being discharged   They saw End at [facility]   They are scheduled to see Kathlen Mody 11/24 at 145 pm     They need to be seen within 1 week    Please call

## 2019-02-20 NOTE — Progress Notes (Signed)
Report given to [facility]. 

## 2019-02-21 ENCOUNTER — Telehealth: Payer: Self-pay

## 2019-02-21 DIAGNOSIS — R7881 Bacteremia: Secondary | ICD-10-CM | POA: Diagnosis not present

## 2019-02-21 DIAGNOSIS — I739 Peripheral vascular disease, unspecified: Secondary | ICD-10-CM | POA: Diagnosis not present

## 2019-02-21 DIAGNOSIS — D72829 Elevated white blood cell count, unspecified: Secondary | ICD-10-CM | POA: Diagnosis not present

## 2019-02-21 DIAGNOSIS — B9620 Unspecified Escherichia coli [E. coli] as the cause of diseases classified elsewhere: Secondary | ICD-10-CM | POA: Diagnosis not present

## 2019-02-21 NOTE — Telephone Encounter (Signed)
Noted! Thank you

## 2019-02-21 NOTE — Telephone Encounter (Signed)
Per the pts hospital discharge notes she was discharged to [facility].  Subjective: Patient seen and examined, up in chair.  Updated her boyfriend by phone who is agreeable to [facility] today.  Patient denies SOB, chest pain, cough, fever/chills or other acute complaints.  States worried about going to facility but understands need for therapy to get strong and remain independent.   TCM Call not needed as the pt was discharged to a NF.

## 2019-02-21 NOTE — Telephone Encounter (Signed)
Pt said was d/c from hospital to [facility] for rehab on 02/20/19 and pt said she did not get her meds on 02/20/19 like she is supposed to. Pt wants Dr Darnell Level to find out what is going on. I spoke with pt and advised she would need to talk with nurse at [facility]; has an assigned provider for pt while at [facility]. Pt will talk with her nurse this morning to make sure pt gets her meds today. Pt will cb if needed. FYI to Dr Darnell Level.

## 2019-02-22 ENCOUNTER — Telehealth: Payer: Self-pay | Admitting: Family Medicine

## 2019-02-22 ENCOUNTER — Telehealth: Payer: Self-pay | Admitting: Physician Assistant

## 2019-02-22 NOTE — Telephone Encounter (Signed)
Per pt call from rehab she would like to know when can she go home?  Please give her a call back.

## 2019-02-22 NOTE — Telephone Encounter (Signed)
Spoke with patient who called to ask if Dr. Acie Fredrickson can release her from [facility]. I  advised that he has not been managing her care there and that he cannot sign her release. I advised her to call her PCP or the provider that has been treating her since she arrived at [facility]. She verbalized understanding and thanked me for the call.

## 2019-02-22 NOTE — Telephone Encounter (Signed)
I recommend she follow doctor's recs at [facility] as he is monitoring her progress there. She can schedule follow up OV here (TCM visit) when she is discharged from [facility].

## 2019-02-22 NOTE — Telephone Encounter (Signed)
Spoke with pt relaying Dr. Synthia Innocent message.  Pt verbalized understanding but stated that's not what she wants and she's going home.  Then pt disconnected the call.

## 2019-02-22 NOTE — Telephone Encounter (Signed)
Pt called she is in [facility].  She stated she not going to see their dr this weekend and pt wants to go home.   She stated she could not see anyone there and has to stay in her room.  She is wanting permission to go home  Pt stated she is walking with her walker now She is doing PT there.

## 2019-02-25 ENCOUNTER — Ambulatory Visit (INDEPENDENT_AMBULATORY_CARE_PROVIDER_SITE_OTHER): Payer: Medicare Other | Admitting: Family Medicine

## 2019-02-25 ENCOUNTER — Telehealth: Payer: Self-pay | Admitting: Family Medicine

## 2019-02-25 ENCOUNTER — Other Ambulatory Visit: Payer: Self-pay

## 2019-02-25 ENCOUNTER — Other Ambulatory Visit: Payer: Self-pay | Admitting: *Deleted

## 2019-02-25 ENCOUNTER — Encounter: Payer: Self-pay | Admitting: Family Medicine

## 2019-02-25 VITALS — Temp 97.8°F | Ht <= 58 in | Wt 138.0 lb

## 2019-02-25 DIAGNOSIS — I480 Paroxysmal atrial fibrillation: Secondary | ICD-10-CM | POA: Diagnosis not present

## 2019-02-25 DIAGNOSIS — J9601 Acute respiratory failure with hypoxia: Secondary | ICD-10-CM

## 2019-02-25 DIAGNOSIS — I10 Essential (primary) hypertension: Secondary | ICD-10-CM

## 2019-02-25 DIAGNOSIS — N390 Urinary tract infection, site not specified: Secondary | ICD-10-CM

## 2019-02-25 DIAGNOSIS — R7881 Bacteremia: Secondary | ICD-10-CM

## 2019-02-25 DIAGNOSIS — J189 Pneumonia, unspecified organism: Secondary | ICD-10-CM | POA: Diagnosis not present

## 2019-02-25 DIAGNOSIS — B9620 Unspecified Escherichia coli [E. coli] as the cause of diseases classified elsewhere: Secondary | ICD-10-CM | POA: Diagnosis not present

## 2019-02-25 DIAGNOSIS — E1151 Type 2 diabetes mellitus with diabetic peripheral angiopathy without gangrene: Secondary | ICD-10-CM | POA: Diagnosis not present

## 2019-02-25 DIAGNOSIS — R1030 Lower abdominal pain, unspecified: Secondary | ICD-10-CM

## 2019-02-25 DIAGNOSIS — I5033 Acute on chronic diastolic (congestive) heart failure: Secondary | ICD-10-CM

## 2019-02-25 DIAGNOSIS — K5712 Diverticulitis of small intestine without perforation or abscess without bleeding: Secondary | ICD-10-CM

## 2019-02-25 MED ORDER — RAMIPRIL 5 MG PO CAPS: 5.0000 mg | ORAL_CAPSULE | Freq: Every day | ORAL | 3 refills | Status: DC

## 2019-02-25 MED ORDER — METOPROLOL TARTRATE 50 MG PO TABS: 50.0000 mg | ORAL_TABLET | Freq: Two times a day (BID) | ORAL | 3 refills | Status: DC

## 2019-02-25 MED ORDER — DILTIAZEM HCL ER COATED BEADS 120 MG PO CP24: 120.0000 mg | ORAL_CAPSULE | Freq: Every day | ORAL | 3 refills | Status: DC

## 2019-02-25 MED ORDER — FUROSEMIDE 20 MG PO TABS: 40.0000 mg | ORAL_TABLET | Freq: Every day | ORAL | 3 refills | Status: DC

## 2019-02-25 MED ORDER — PANTOPRAZOLE SODIUM 40 MG PO TBEC: 40.0000 mg | DELAYED_RELEASE_TABLET | Freq: Two times a day (BID) | ORAL | 3 refills | Status: DC

## 2019-02-25 NOTE — Patient Outreach (Signed)
[facility]) Care Management  02/25/2019  [name] Jun 03, 1927 [id number]   High priority referral received today to outreach patient regarding meals and transportation. Discharged from [facility] on 02/22/19.   Successful outreach to patient today.  Spoke to patient and a family friend, Waunita Schooner.  Introduced self and reason for referral.  Discussed Meals on Wheels program and informed them that wait list is approximately 6-8 months long at this time.  Also talked with patient about [employer] Meals program which would allow for 30 days of meal delivery.  Offered to refer patient to both programs but she denied the need for assistance at this time.   Also talked with them about transportation resources specifically [place] and [employer].  Patient also denied the need for transportation resource at this time.  No other social work needs identified.    Closing social work case at this time but did provide her with my contact information in case needs arise.   Ronn Melena, BSW Social Worker [phone]

## 2019-02-25 NOTE — Progress Notes (Signed)
Cardiology Office Note:    Date:  02/26/2019   ID:  [name], DOB 19-May-1927, MRN [id number]  PCP:  Ria Bush, MD  Cardiologist:  Mertie Moores, MD  Electrophysiologist:  None   Referring MD: Ria Bush, MD   Chief Complaint  Patient presents with   Hospitalization Follow-up    CHF and AF w RVR in setting of diverticulitis, bacteremia     History of Present Illness:    [name] is a 85 y.o. female with:   Coronary artery disease ? S/p CABG in 1996; postop atrial fibrillation ? Cath in 2005 with patent grafts ? Myoview 11/15: No ischemia  Atrial fibrillation, persistent ? CHADS2-VASc=6 (Diab, HTN, CAD, age x 2, female)   Heart failure with preserved ejection fraction   Diabetes mellitus  Hypertension  Hyperlipidemia  Venous insufficiency  History of right DVT  Peripheral arterial disease  Status post cholecystectomy  Right leg diabetic wound, followed by wound clinic  Diverticulosis  [name] was seen by Dr. Acie Fredrickson in September 2020.  She was back in atrial fibrillation.  She was placed on apixaban.  I saw her in October 2020 to discuss moving forward with cardioversion.  However, at that time, she was hesitant to proceed.  I adjusted her beta-blocker for better rate control.  We decided to bring her back in several weeks for close follow-up.  She was admitted to [facility] 11/5-11/18 with duodenal diverticulitis and E. coli bacteremia in the setting of E. coli UTI complicated by decompensated diastolic heart failure and possible right lung pneumonia, acute kidney injury and uncontrolled heart rates while in atrial fibrillation.  She was seen by cardiology and diltiazem was added to her medical regimen for better heart rate control.  She was discharged to SN[facility]  She returns for follow-up.  She is here alone.  She feels much better since discharge from the hospital.  Her breathing is stable.  She  had a telemedicine visit with primary care yesterday.  She was only taking 10 mg of furosemide.  This was increased back to 40 mg as directed by the hospital.  She has not had significant leg swelling.  She has not had orthopnea.  She does not really weigh herself on a regular basis.  She has occasional chest pain related to anxiety.  This is a chronic symptom without change.  She has not had syncope.  She has not had hematochezia.    Prior CV studies:   The following studies were reviewed today:  Echocardiogram 02/10/2019 EF 55-60, mild LVH, moderate LAE, moderate MR, moderate TR, RVSP 52.4  ABIs 01/15/2019 +-------+-----------+-----------+------------+------------+  ABI/TBI Today's ABI Today's TBI Previous ABI Previous TBI  +-------+-----------+-----------+------------+------------+  Right  .95  .90  .67  0   +-------+-----------+-----------+------------+------------+  Left  .71  .58  .65  0   +-------+-----------+-----------+------------+------------+ Bilateral ABIs appear increased compared to prior study on 06/05/2017. Bilateral TBIs appear increased compared to prior study on 06/05/2017.  Summary: Right: Resting right ankle-brachial index is within normal range. No evidence of significant right lower extremity arterial disease. The right toe-brachial index is normal.  Left: Resting left ankle-brachial index indicates moderate left lower extremity arterial disease. The left toe-brachial index is abnormal.  Echocardiogram 09/28/2015 Moderate concentric LVH, normal systolic function, normal wall motion, trivial AI, MAC, trivial MR, mild LAE  Myoview 02/19/2014 Low risk stress nuclear study with a medium sized, moderately intense fixed defect in the basal and mid lateral  wall. In the setting of normal LVF, this most likely represents breast attenuation artifact. No inducible ischemia is noted.  LV Ejection Fraction:  67%.  Cardiac catheterization 12/15/2003 LAD prox 40-50, then 100 OM1 100 RCA prox 50, mid 20-30, dist 90 S-RCA patent S-OM1 patent L-LAD patent EF 60-65   Past Medical History:  Diagnosis Date   Acute deep vein thrombosis (DVT) of distal vein of right lower extremity ([place]) 12/2013   Acute thrombus of R proximal gastrocnemius vein. Symptomatic provoked distal DVT   Anxiety    Cellulitis of leg, left 01/16/2014   Chronic back pain 03/2012   lumbar DDD with herniation - s/p L S1 transforaminal ESI (10/2012 Dr. Sharlet Salina)   Chronic venous insufficiency    Coronary artery disease    CABG 1998   Cystocele    Depression    Diabetes mellitus    type 2, followed by endo.   Diastolic dysfunction    Diverticulosis    Hemorrhoids    History of heart attack    Hx of echocardiogram    a. Echo 10/13: mild LVH, EF 55-60%, Gr 1 diast dysfn, PASP 32   Hyperlipidemia    Hypertension    Irritable bowel syndrome    Microcytic anemia    PAD (peripheral artery disease) ([place]) 2013   ABI: R 0.91, L 0.83   Postmenopausal osteoporosis 01/2019   T score of -2.6   PUD (peptic ulcer disease)    Varicose veins    Venous ulcer of leg ([place]) 12/12/2013   Required prolonged treatment by wound center   Viral gastroenteritis due to [place] virus 05/13/2015   hospitalization   Surgical Hx: The patient  has a past surgical history that includes Tympanoplasty (1960s); Coronary artery bypass graft (1998); CATARACT SURGERY; Cholecystectomy; Appendectomy; Colonoscopy (02/2010); Hand surgery; lexiscan myoview (03/2011); toe surgery; Abdominal hysterectomy (1975); Vault prolapse A & P repair (2009); dexa scan (09/2011); ESI (2014); ABI (2013); Endovenous ablation saphenous vein w/ laser (Left, 03/2014); Cardiovascular stress test (2015); and Lower Extremity Angiography (Right, 09/06/2018).   Current Medications: Current Meds  Medication Sig   acetaminophen (TYLENOL) 500 MG tablet Take 500  mg by mouth every 6 (six) hours as needed for mild pain.   ALPRAZolam (XANAX) 1 MG tablet Take 0.5 tablets (0.5 mg total) by mouth 4 (four) times daily as needed for anxiety.   apixaban (ELIQUIS) 5 MG TABS tablet Take 1 tablet (5 mg total) by mouth 2 (two) times daily.   collagenase (SANTYL) ointment Apply topically daily.   diclofenac sodium (VOLTAREN) 1 % GEL Apply 1 application topically 3 (three) times daily.   diltiazem (CARDIZEM CD) 120 MG 24 hr capsule Take 1 capsule (120 mg total) by mouth daily.   furosemide (LASIX) 20 MG tablet Take 2 tablets (40 mg total) by mouth daily.   glucosamine-chondroitin 500-400 MG tablet Take 1 tablet by mouth daily.    glucose blood (ONETOUCH VERIO) test strip USE TO TEST BLOOD SUGAR ONCE DAILY. DX:E11.9   HYDROcodone-acetaminophen (NORCO/VICODIN) 5-325 MG tablet Take 0.5 tablets by mouth every 6 (six) hours as needed for moderate pain.   loperamide (IMODIUM) 2 MG capsule Take 2 mg by mouth 4 (four) times daily as needed for diarrhea or loose stools.   loratadine (CLARITIN) 10 MG tablet Take 10 mg by mouth daily as needed for allergies.   Multiple Vitamin (MULTIVITAMIN) tablet Take 1 tablet by mouth daily. Patient reports takes a new vitamin to replace her centrum as recommended by her  eye doctor.   nitroGLYCERIN (NITROSTAT) 0.4 MG SL tablet Place 1 tablet (0.4 mg total) under the tongue every 5 (five) minutes as needed for chest pain.   nystatin cream (MYCOSTATIN) Apply 1 application topically 2 (two) times daily.   nystatin-triamcinolone ointment (MYCOLOG) APPLY TO AFFECTED AREA TWICE A DAY AS NEEDED FOR ITCHING   pantoprazole (PROTONIX) 40 MG tablet Take 1 tablet (40 mg total) by mouth 2 (two) times daily.   pioglitazone (ACTOS) 30 MG tablet TAKE 1 TABLET BY MOUTH EVERY DAY   polyethylene glycol powder (GLYCOLAX/MIRALAX) powder Take 17 g by mouth daily. Hold for diarrhea   pravastatin (PRAVACHOL) 20 MG tablet TAKE 1 TABLET BY MOUTH EVERY  DAY   Probiotic Product (ALIGN) 4 MG CAPS Take 1 capsule (4 mg total) by mouth daily.   ramipril (ALTACE) 5 MG capsule Take 1 capsule (5 mg total) by mouth daily.   traZODone (DESYREL) 150 MG tablet Take 150 mg by mouth at bedtime.    triamcinolone cream (KENALOG) 0.1 % APPLY TO AFFECTED AREA TWICE A DAY   vitamin B-12 (CYANOCOBALAMIN) 1000 MCG tablet Take 1,000 mcg by mouth daily.   vitamin E 400 UNIT capsule Take 400 Units by mouth daily.   [DISCONTINUED] metoprolol tartrate (LOPRESSOR) 50 MG tablet Take 1 tablet (50 mg total) by mouth 2 (two) times daily.     Allergies:   Metformin and related   Social History   Tobacco Use   Smoking status: Former Smoker    Quit date: 03/05/1979    Years since quitting: 40.0   Smokeless tobacco: Never Used  Substance Use Topics   Alcohol use: No    Alcohol/week: 0.0 standard drinks   Drug use: No     Family Hx: The patient's family history includes Diabetes in her brother, father, and sister; Heart disease in her son; Hypertension in her mother; Stroke in her mother. There is no history of Colon cancer.  ROS:   Please see the history of present illness.    ROS All other systems reviewed and are negative.   EKGs/Labs/Other Test Reviewed:    EKG:  EKG is  ordered today.  The ekg ordered today demonstrates atrial fibrillation, HR 96, normal axis, nonspecific ST-T wave changes, QTC 462, no change from prior tracing  Recent Labs: 09/04/2018: TSH 2.63 02/07/2019: ALT 18 02/14/2019: B Natriuretic Peptide 323.0 02/19/2019: Hemoglobin 12.5; Magnesium 2.0; Platelets 265 02/20/2019: BUN 38; Creatinine, Ser 1.01; Potassium 4.4; Sodium 140   Recent Lipid Panel Lab Results  Component Value Date/Time   CHOL 115 02/04/2019 02:09 PM   TRIG 60.0 02/04/2019 02:09 PM   HDL 47.20 02/04/2019 02:09 PM   CHOLHDL 2 02/04/2019 02:09 PM   LDLCALC 56 02/04/2019 02:09 PM    Physical Exam:    VS:  BP 122/66    Pulse (!) 105    Ht _0  (1.448 m)     Wt 141 lb 1.9 oz (64 kg)    SpO2 96%    BMI 30.54 kg/m     Wt Readings from Last 3 Encounters:  02/26/19 141 lb 1.9 oz (64 kg)  02/25/19 138 lb (62.6 kg)  02/20/19 136 lb 7.4 oz (61.9 kg)     Physical Exam  Constitutional: She is oriented to person, place, and time. She appears well-developed and well-nourished. No distress.  HENT:  Head: Normocephalic and atraumatic.  Eyes: No scleral icterus.  Neck: No thyromegaly present.  Cardiovascular: Normal rate. An irregularly irregular rhythm present.  No murmur heard. Pulmonary/Chest: Effort normal. She has no rales.  Abdominal: Soft.  Musculoskeletal:        General: No edema.  Lymphadenopathy:    She has no cervical adenopathy.  Neurological: She is alert and oriented to person, place, and time.  Skin: Skin is warm and dry.  Psychiatric: She has a normal mood and affect.    ASSESSMENT & PLAN:    1. Persistent atrial fibrillation ([place]) She remains in atrial fibrillation.  She had difficult to control heart rates during her most recent hospitalization with diverticulitis and E. coli bacteremia complicated by acute heart failure and acute kidney injury as well as possible pneumonia.  Her heart rates are better controlled now.  However, her heart rates could be better.  She declines proceeding with cardioversion.  Continue current dose of diltiazem.  Increase metoprolol tartrate to 75 mg twice daily.  Obtain 24-hour Holter monitor to better assess heart rate control.  Follow-up with Dr. Acie Fredrickson in 3 months.  2. Chronic heart failure with preserved ejection fraction ([place]) EF 55-60 by echo 02/10/2019.  Volume status seems to be stable.  Continue current dose of furosemide.  3. Coronary artery disease involving native coronary artery of native heart with angina pectoris ([place]) History of CABG in 1996.  Myoview in 2015 was negative.  She continues to have chest pain mainly with emotional stress.  No further work-up is indicated at this time.   She is not on aspirin as she is on Apixaban.  Continue pravastatin.  4. Essential hypertension The patient's blood pressure is controlled on her current regimen.  Continue current therapy.   5. Diabetes mellitus type 2 with peripheral artery disease ([place]) Continue follow up with PCP.  Consider changing Pioglitazone to another agent due to hx of CHF.  Could consider Empagliflozin (EMPA-REG trial).     Dispo:  Return in about 3 months (around 05/29/2019) for Routine Follow Up, w/ Dr. Acie Fredrickson, in person.   Medication Adjustments/Labs and Tests Ordered: Current medicines are reviewed at length with the patient today.  Concerns regarding medicines are outlined above.  Tests Ordered: Orders Placed This Encounter  Procedures   Holter monitor - 24 hour   EKG 12-Lead   Medication Changes: Meds ordered this encounter  Medications   metoprolol tartrate (LOPRESSOR) 50 MG tablet    Sig: Take 1.5 tablets (75 mg total) by mouth 2 (two) times daily.    Dispense:  270 tablet    Refill:  3    Signed, [name], PA-C  02/26/2019 2:28 PM    [facility] [place], [place], [place]  [address] Phone: [phone]; Fax: [phone]

## 2019-02-25 NOTE — Assessment & Plan Note (Signed)
Recent CHF exacerbation in setting of acute illness and afib with RVR. She is currently only taking 10mg  lasix daily - I have sent in new dose lasix 20mg  2 tab daily to pharmacy.

## 2019-02-25 NOTE — Progress Notes (Signed)
[name] - 85 y.o. female  MRN [id number]  Date of Birth: 1927/08/20  PCP: Ria Bush, MD  This service was provided via telemedicine. Phone Visit performed on 02/25/2019    Rationale for phone visit along with limitations reviewed. Patient consented to telephone encounter.    Location of patient: at home Location of provider: in office, [place] @ [facility] Name of referring provider: N/A   Names of persons and role in encounter: Provider: Ria Bush, MD  Patient: [name]  Other: N/A    Time on call: 11:30 am - 12:03 pm    Subjective: Chief Complaint  Patient presents with  . Abdominal Pain    C/o lower abd pain and occasional nausea.  Denies vomiting or diarrhea. Started 02/04/19.    Marland Kitchen Nasal Congestion    C/o runny nose, congestion and HA.      HPI:  Recent hospitalization for acute abdominal pain with leukocytosis to 25. CT scan showed fluid filled outpouching arising from duodenum ?contained perf of duodenal ulcer or inflamed duodenal diverticulum. Followed by GI/gen surgery, recommended medical treatment. Treated for UTI with first IV zosyn then IV rocephin/unasyn, and transitioned to oral augmentin course. She had UCx and Blcx both growing pansensitive E coli, discharged on augmentin course to complete 2 wk course. During hospitalization she did have Afib with RVR and decompensated CHF exacerbation. She was followed by cardiology, declined cardioversion. She continued eliquis, metoprolol was increased and cardizem was added.   She is currently taking metoprolol 25mg  twice daily - she just had 25mg  tablet filled by pharmacy. Advised to increase to 2 tablets (50mg ) BID. She is NOT taking diltiazem CD 120mg  daily. Apparently Rx were printed but she never received this so she has not been taking any meds from recent discharge. I refilled recent meds with changes to local pharmacy.   Pt decided to leave [place] on Friday. Left due to  claustrophobia with anxiety.   She still has some abdominal pain as well as shortness of breath "a Kost bit" - overall stable. Appetite ok. Some nausea today after breakfast. No fevers since discharge. No current dysuria. She denies significant congestion or runny nose or cough.   She has cardiology f/u scheduled tomorrow. Will need blood work at that time.   Admit date: 02/07/2019 Discharge date: 02/20/2019 TCM hosp f/u phone call not completed as pt discharged to [facility] then left on her own on 02/22/2019  Admitted From: Home Disposition:  SNF, [place]  Recommendations for Outpatient Follow-up:  1. Follow up with PCP in 1-2 weeks 2. Please obtain BMP/CBC in one week 3. Please follow up with cardiology for [place]: No Equipment/Devices: N/A  Discharge Condition: Stable CODE STATUS: FULL Diet recommendation: Heart Healthy / Carb Modified     Discharge Diagnoses: Principal Problem:   E coli bacteremia Active Problems:   Coronary artery disease   Hypertension   Hyperlipidemia   Type 2 diabetes mellitus with neurological manifestations, controlled ([place])   Diabetes mellitus type 2 with peripheral artery disease (HCC)   Paroxysmal atrial fibrillation (HCC)   Bacteremia due to Gram-negative bacteria   Dyspnea   Diverticulitis of duodenum   Acute respiratory failure with hypoxia (HCC)   AKI (acute kidney injury) (HCC)   Chronic diastolic CHF (congestive heart failure) (HCC)   E. coli UTI   Acute on chronic diastolic CHF (congestive heart failure) (HCC)   PNA (pneumonia)   Objective/Observations:  No  physical exam or vital signs collected unless specifically identified below.   Temp 97.8 F (36.6 C)   Ht 4\' 9"  (1.448 m)   Wt 138 lb (62.6 kg)   BMI 29.86 kg/m    Respiratory status: speaks in complete sentences without evident shortness of breath.   Assessment/Plan:  E coli bacteremia Completed at least 12 days of antibiotic therapy.  Overall improved.   E. coli UTI Completed 12 days of antibiotic therapy, overall improved.   (HFpEF) heart failure with preserved ejection fraction (HCC) Recent CHF exacerbation in setting of acute illness and afib with RVR. She is currently only taking 10mg  lasix daily - I have sent in new dose lasix 20mg  2 tab daily to pharmacy.   Abdominal pain Chronic, possibly from UTI. Was found to have doudenal diverticulum on imaging, but after GI and gen surg eval, was not thought to be contributing to recent hospitalization symptomatology. Will need BMP, CBC at next in-person visit.   Acute respiratory failure with hypoxia (HCC) Presumed cardiac in source, discharged weaned off oxygen.   Paroxysmal atrial fibrillation (HCC) With RVR during recent hospitalization, anticipate contributing to dyspnea, she declined cardioversion, has cards f/u tomorrow.  I have refilled recent meds with correct doses to local pharmacy and extensively reviewed current med list with patient.   Pneumonia Found incidentally, while she was on antibiotic treatment for bacteremia, likely concomitantly treated.    I discussed the assessment and treatment plan with the patient. The patient was provided an opportunity to ask questions and all were answered. The patient agreed with the plan and demonstrated an understanding of the instructions.  Lab Orders  No laboratory test(s) ordered today    Meds ordered this encounter  Medications  . metoprolol tartrate (LOPRESSOR) 50 MG tablet    Sig: Take 1 tablet (50 mg total) by mouth 2 (two) times daily.    Dispense:  60 tablet    Refill:  3  . diltiazem (CARDIZEM CD) 120 MG 24 hr capsule    Sig: Take 1 capsule (120 mg total) by mouth daily.    Dispense:  30 capsule    Refill:  3  . furosemide (LASIX) 20 MG tablet    Sig: Take 2 tablets (40 mg total) by mouth daily.    Dispense:  60 tablet    Refill:  3  . pantoprazole (PROTONIX) 40 MG tablet    Sig: Take 1 tablet (40  mg total) by mouth 2 (two) times daily.    Dispense:  60 tablet    Refill:  3  . ramipril (ALTACE) 5 MG capsule    Sig: Take 1 capsule (5 mg total) by mouth daily.    Dispense:  30 capsule    Refill:  3    The patient was advised to call back or seek an in-person evaluation if the symptoms worsen or if the condition fails to improve as anticipated.  Ria Bush, MD

## 2019-02-25 NOTE — Telephone Encounter (Signed)
Spoke with patient today. 

## 2019-02-25 NOTE — Assessment & Plan Note (Signed)
Found incidentally, while she was on antibiotic treatment for bacteremia, likely concomitantly treated.

## 2019-02-25 NOTE — Assessment & Plan Note (Addendum)
With RVR during recent hospitalization, anticipate contributing to dyspnea, she declined cardioversion, has cards f/u tomorrow.  I have refilled recent meds with correct doses to local pharmacy and extensively reviewed current med list with patient.

## 2019-02-25 NOTE — Assessment & Plan Note (Signed)
Presumed cardiac in source, discharged weaned off oxygen.

## 2019-02-25 NOTE — Assessment & Plan Note (Signed)
Completed 12 days of antibiotic therapy, overall improved.

## 2019-02-25 NOTE — Assessment & Plan Note (Addendum)
Chronic, possibly from UTI. Was found to have doudenal diverticulum on imaging, but after GI and gen surg eval, was not thought to be contributing to recent hospitalization symptomatology. Will need BMP, CBC at next in-person visit.

## 2019-02-25 NOTE — Telephone Encounter (Signed)
At[place], THM, called.  She spoke to patient.  Patient left [facility]. Orrin Brigham said patient needs home health ordered for physical therapy. The hospital recommended she go to [facility] for physical place and patient left [facility] because she was feeling claustrophobic and wanted to go home. Patient has phone visit scheduled with Dr.G today.

## 2019-02-25 NOTE — Telephone Encounter (Signed)
Patient called today to schedule appointment  She stated that reason for appointment was for congestion and still having stomach pain.  When advised that we weren't able to bring patient's in with congestion right now she changed her reasoning and stated she had no congestion that she was needing to see/speak with her provider.  She was able to schedule phone visit.

## 2019-02-25 NOTE — Assessment & Plan Note (Signed)
Completed at least 12 days of antibiotic therapy. Overall improved.

## 2019-02-25 NOTE — Patient Outreach (Signed)
Member assessed for potential [facility] Care Management needs as a benefit of [place] Medicare.  Verified in Patient [name] that [name] was at [facility] from 11/18 to 02/22/2019.   Telephone call made to [name] to discuss [place] Management and to find out why she left [facility] so soon. Patient identifiers confirmed. [name] confirms the best contact number for her is her cell at [phone].  [name] states that she left [facility] because " I am claustrophobic and I just wanted to go home. I needed to get out of there." [name] states she does not have home health currently. Discussed that Probation officer will leave message for PCP to make aware and request that home health be ordered. Especially since initial recommendations were for SNF.   [name] confirms that she lives with her significant other. States she will need assist for transportation to PCP appointments. 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 Agreeable to [place] for meals and transportation assistance as well.   [name] states all but 1 of her children has passed away. Her living child now has cancer however. Therefore, [name] has limited support.   [name] is agreeable to [place] Management services for [facility] RNCM and [facility] Social Worker.   Also Probation officer contacted PCP office to make aware that [name] discharged from SNF from being there only 2 days and that she could benefit from home health services.  Will make referral to [place] Management team. [name] has limited support with multiple co-morbidities.   Marthenia Rolling, MSN-Ed, RN,BSN [place] Acute Care Coordinator [phone] [facility]) [phone]  (Toll free office)

## 2019-02-25 NOTE — Telephone Encounter (Signed)
Seen today for virtual visit.

## 2019-02-26 ENCOUNTER — Other Ambulatory Visit: Payer: Self-pay

## 2019-02-26 ENCOUNTER — Ambulatory Visit (INDEPENDENT_AMBULATORY_CARE_PROVIDER_SITE_OTHER): Payer: Medicare Other | Admitting: Physician Assistant

## 2019-02-26 ENCOUNTER — Other Ambulatory Visit: Payer: Self-pay | Admitting: *Deleted

## 2019-02-26 ENCOUNTER — Encounter: Payer: Self-pay | Admitting: Physician Assistant

## 2019-02-26 VITALS — BP 122/66 | HR 105 | Ht <= 58 in | Wt 141.1 lb

## 2019-02-26 DIAGNOSIS — I10 Essential (primary) hypertension: Secondary | ICD-10-CM

## 2019-02-26 DIAGNOSIS — I5032 Chronic diastolic (congestive) heart failure: Secondary | ICD-10-CM | POA: Diagnosis not present

## 2019-02-26 DIAGNOSIS — I4819 Other persistent atrial fibrillation: Secondary | ICD-10-CM | POA: Diagnosis not present

## 2019-02-26 DIAGNOSIS — E1151 Type 2 diabetes mellitus with diabetic peripheral angiopathy without gangrene: Secondary | ICD-10-CM

## 2019-02-26 DIAGNOSIS — I7025 Atherosclerosis of native arteries of other extremities with ulceration: Secondary | ICD-10-CM

## 2019-02-26 DIAGNOSIS — I25119 Atherosclerotic heart disease of native coronary artery with unspecified angina pectoris: Secondary | ICD-10-CM | POA: Diagnosis not present

## 2019-02-26 MED ORDER — METOPROLOL TARTRATE 50 MG PO TABS: 75.0000 mg | ORAL_TABLET | Freq: Two times a day (BID) | ORAL | 3 refills | Status: DC

## 2019-02-26 NOTE — Patient Outreach (Signed)
Referral received from post acute care coordinator, pt at [facility] 11/18-11/20/20 and pt demanded to leave and go home, post acute care coordinator noted she made phone call to MD regarding pt needing order for home health, pt lives with significant other, pt with diagnoses DM, HF, pneumonia, bil hearing loss.  Primary MD completes transition of care.  Outreach call to pt for screening, spoke with pt, HIPAA verified, pt reports " can you call me back another day, I'm at a doctor's appointment"  PLAN Outreach pt tomorrow for screening  Jacqlyn Larsen [facility], [place] Coordinator [phone]

## 2019-02-26 NOTE — Patient Instructions (Signed)
Medication Instructions:  Your physician has recommended you make the following change in your medication:  1. INCREASE METOPROLOL TO 75 MG TWICE DAILY   *If you need a refill on your cardiac medications before your next appointment, please call your pharmacy*  Lab Work: NONE If you have labs (blood work) drawn today and your tests are completely normal, you will receive your results only by: Marland Kitchen MyChart Message (if you have MyChart) OR . A paper copy in the mail If you have any lab test that is abnormal or we need to change your treatment, we will call you to review the results.  Testing/Procedures: Your physician has recommended that you wear a 24-HOUR holter monitor. Holter monitors are medical devices that record the heart's electrical activity. Doctors most often use these monitors to diagnose arrhythmias. Arrhythmias are problems with the speed or rhythm of the heartbeat. The monitor is a small, portable device. You can wear one while you do your normal daily activities. This is usually used to diagnose what is causing palpitations/syncope (passing out).  Follow-Up: At [facility], you and your health needs are our priority.  As part of our continuing mission to provide you with exceptional heart care, we have created designated Provider Care Teams.  These Care Teams include your primary Cardiologist (physician) and Advanced Practice Providers (APPs -  Physician Assistants and Nurse Practitioners) who all work together to provide you with the care you need, when you need it.  Your next appointment:   3 month(s)  The format for your next appointment:   In Person  Provider:   Mertie Moores, MD

## 2019-02-27 ENCOUNTER — Encounter: Payer: Self-pay | Admitting: *Deleted

## 2019-02-27 ENCOUNTER — Other Ambulatory Visit: Payer: Self-pay | Admitting: *Deleted

## 2019-02-27 NOTE — Patient Outreach (Signed)
Outreach call to pt for screening, pt discharged [facility] 02/22/19 after asking to be released, spoke with pt who reports she was at [facility] after having pneumonia,  Pt states she lives with significant other but he is limited as to what he can do to assist pt if she needs it.  Pt states a friend transports her to appointments, pt is able to drive but hasn't recently.  Pt states she has a scale but does not weigh but is willing to start.  Pt feels diabetes " is not a problem, most readings in low 100's, none over 200"  Pt states she has had depression for years and sees psychiatrist/ psychologist and states MD is aware,  Pt states she takes xanax and trazadone also to help with her anxiety and is not interested in taking more medication.  Pt states 3 of her 4 children have passed away and the last remaining child has cancer.  Pt feels pneumonia set her back and this has been her biggest challenge recently. RN CM reviewed medications with pt.  RN CM faxed today's note and barrier letter to primary MD.  Outpatient Encounter Medications as of 02/27/2019  Medication Sig  . acetaminophen (TYLENOL) 500 MG tablet Take 500 mg by mouth every 6 (six) hours as needed for mild pain.  Marland Kitchen ALPRAZolam (XANAX) 1 MG tablet Take 0.5 tablets (0.5 mg total) by mouth 4 (four) times daily as needed for anxiety.  Marland Kitchen apixaban (ELIQUIS) 5 MG TABS tablet Take 1 tablet (5 mg total) by mouth 2 (two) times daily.  Marland Kitchen diltiazem (CARDIZEM CD) 120 MG 24 hr capsule Take 1 capsule (120 mg total) by mouth daily.  . furosemide (LASIX) 20 MG tablet Take 2 tablets (40 mg total) by mouth daily.  Marland Kitchen glucosamine-chondroitin 500-400 MG tablet Take 1 tablet by mouth daily.   Marland Kitchen glucose blood (ONETOUCH VERIO) test strip USE TO TEST BLOOD SUGAR ONCE DAILY. DX:E11.9  . HYDROcodone-acetaminophen (NORCO/VICODIN) 5-325 MG tablet Take 0.5 tablets by mouth every 6 (six) hours as needed for moderate pain.  Marland Kitchen loperamide (IMODIUM) 2 MG capsule Take 2 mg  by mouth 4 (four) times daily as needed for diarrhea or loose stools.  Marland Kitchen loratadine (CLARITIN) 10 MG tablet Take 10 mg by mouth daily as needed for allergies.  . metoprolol tartrate (LOPRESSOR) 50 MG tablet Take 1.5 tablets (75 mg total) by mouth 2 (two) times daily.  . Multiple Vitamin (MULTIVITAMIN) tablet Take 1 tablet by mouth daily. Patient reports takes a new vitamin to replace her centrum as recommended by her eye doctor.  . nitroGLYCERIN (NITROSTAT) 0.4 MG SL tablet Place 1 tablet (0.4 mg total) under the tongue every 5 (five) minutes as needed for chest pain.  . pantoprazole (PROTONIX) 40 MG tablet Take 1 tablet (40 mg total) by mouth 2 (two) times daily.  . pioglitazone (ACTOS) 30 MG tablet TAKE 1 TABLET BY MOUTH EVERY DAY  . polyethylene glycol powder (GLYCOLAX/MIRALAX) powder Take 17 g by mouth daily. Hold for diarrhea  . pravastatin (PRAVACHOL) 20 MG tablet TAKE 1 TABLET BY MOUTH EVERY DAY  . Probiotic Product (ALIGN) 4 MG CAPS Take 1 capsule (4 mg total) by mouth daily.  . ramipril (ALTACE) 5 MG capsule Take 1 capsule (5 mg total) by mouth daily.  . traZODone (DESYREL) 150 MG tablet Take 150 mg by mouth at bedtime.   . vitamin B-12 (CYANOCOBALAMIN) 1000 MCG tablet Take 1,000 mcg by mouth daily.  . vitamin E 400 UNIT  capsule Take 400 Units by mouth daily.  . collagenase (SANTYL) ointment Apply topically daily. (Patient not taking: Reported on 02/27/2019)  . diclofenac sodium (VOLTAREN) 1 % GEL Apply 1 application topically 3 (three) times daily. (Patient not taking: Reported on 02/27/2019)  . nystatin cream (MYCOSTATIN) Apply 1 application topically 2 (two) times daily. (Patient not taking: Reported on 02/27/2019)  . nystatin-triamcinolone ointment (MYCOLOG) APPLY TO AFFECTED AREA TWICE A DAY AS NEEDED FOR ITCHING (Patient not taking: Reported on 02/27/2019)  . triamcinolone cream (KENALOG) 0.1 % APPLY TO AFFECTED AREA TWICE A DAY (Patient not taking: Reported on 02/27/2019)   No  facility-administered encounter medications on file as of 02/27/2019.     THN CM Care Plan Problem One     Most Recent Value  Care Plan Problem One  Knowledge deficit relatd to recent pneumonia  Role Documenting the Problem One  Care Management Coordinator  Care Plan for Problem One  Active  THN Long Term Goal   Pt will verbalize ways to improve self care to prevent infections/ pneumonia within 60 days  THN Long Term Goal Start Date  02/27/19  Interventions for Problem One Long Term Goal  RN CM established and reviewed plan of care wtih pt, mailed successful outreach letter to pt home including 24 hour nurse line magnet, pamphlet, consent form.  THN CM Short Term Goal #1   Pt will verbalize signs/ symptoms infection and preventive measures within 30 days  THN CM Short Term Goal #1 Start Date  02/27/19  Interventions for Short Term Goal #1  RN CM reviewed signs/ symptoms of infection and pnemonia such as fever, cough, etc and importance of reporting early to MD, reviewed action plan    [facility] CM Care Plan Problem Two     Most Recent Value  Care Plan Problem Two  Knowledge deficit related to CHF  Role Documenting the Problem Two  Care Management Coordinator  Care Plan for Problem Two  Active  THN CM Short Term Goal #1   Pt will verbalize CHF action plan/ zones within 30 days  THN CM Short Term Goal #1 Start Date  02/27/19  Interventions for Short Term Goal #2   RN CM reviewed HF action plan with emphasis on yellow zone  THN CM Short Term Goal #2   Pt will weigh daily and record within 30 days  THN CM Short Term Goal #2 Start Date  02/27/19  Interventions for Short Term Goal #2  RN CM ask pt to start weighing daily and recording, take to MD appointments, ask pt to weigh in morning after emptying bladder, before eating.      PLAN Outreach pt for telephone assessment next month Assess weight, CBG  Jacqlyn Larsen [facility], BSN [place] Coordinator [phone]

## 2019-03-04 ENCOUNTER — Telehealth: Payer: Self-pay | Admitting: Family Medicine

## 2019-03-04 ENCOUNTER — Other Ambulatory Visit: Payer: Self-pay | Admitting: *Deleted

## 2019-03-04 DIAGNOSIS — J189 Pneumonia, unspecified organism: Secondary | ICD-10-CM

## 2019-03-04 NOTE — Telephone Encounter (Signed)
Patient is requesting a call back She is wanting to make sure she is taking all of her medications correctly. Patient stated that she is sleeping a lot more than usual. Patient will be sitting in the chair and next thing she knows she is asleep.   Please advise '

## 2019-03-04 NOTE — Telephone Encounter (Signed)
Attempted to return pt's call.  No answer.  Vm box is not set up.

## 2019-03-04 NOTE — Telephone Encounter (Signed)
Spoke with pt answering all med questions to her satisfaction.

## 2019-03-04 NOTE — Patient Outreach (Addendum)
Red EMMI general discharge received for day #4, Got discharge papers- no.  Outreach call to pt to address and pt states "my granddaughter signed for them and they're here somewhere"  Pt states she will need to have someone help her look for the papers.  Pt states she needs more assistance in the home because " I get confused with my medicine"  Pt agreeable to have [facility] pharmacist outreach her for any issues with medication.  RN CM sent message to [place] worker (pt was previously referred) about PCS services through [facility] benefit.  Pt states " I don't think home health was ordered"  RN CM faxed note to primary MD Dr. Danise Mina asking if home health was ordered as pt is inquiring about these services.  PLAN Collaborate with social work and pharmacist as needed Follow up about home health  Jacqlyn Larsen [facility], [place] Coordinator [phone]

## 2019-03-05 ENCOUNTER — Other Ambulatory Visit: Payer: Self-pay | Admitting: Pharmacist

## 2019-03-05 ENCOUNTER — Other Ambulatory Visit: Payer: Self-pay | Admitting: Family Medicine

## 2019-03-05 ENCOUNTER — Other Ambulatory Visit: Payer: Self-pay

## 2019-03-05 MED ORDER — HYDROCODONE-ACETAMINOPHEN 5-325 MG PO TABS: 0.5000 | ORAL_TABLET | Freq: Four times a day (QID) | ORAL | 0 refills | Status: DC | PRN

## 2019-03-05 NOTE — Patient Outreach (Addendum)
[facility]) Care Management  03/05/2019  BRILEE PORT [birth year] [id number]   In-basket message received from [facility], Jacqlyn Larsen, to outreach patient regarding need for long term assistance in the home.  Message stated that patient has Medicaid and is interested in pcs. Successful outreach to patient today.  Introduced self and reason for call.  Talked with patient about in-home aide/personal care services, however, patient does not have Medicaid.  Explained to patient that Medicare does not cover pcs; patient unable to afford the cost for private hire aide services.  Talked with patient about Medicaid eligibility.  Based on income information patient provided, she may qualify for Medicaid.  Patient had a very difficult time understanding all of this information.  Inquired about someone in the family that I may call to discuss this information and that may be willing to assist with Medicaid application if patient wishes to apply.  Patient suggested that I call her granddaughter, [name], and consented to call.   Left voicemail message for granddaughter.  Will attempt to reach again within four business days if no return call.  Addendum: Received return call from patient's granddaughter.  Informed her of conversation with patient.  Discussed Medicaid eligibility and recommended that patient apply if her income is under the limit.  Granddaughter stated that she will follow up with patient to discuss this further and call me back if she wishes to apply.  Will follow up next week if no return call.    Ronn Melena, BSW Social Worker [phone]

## 2019-03-05 NOTE — Telephone Encounter (Signed)
Patient is requesting a refill   She will have enough medication for today only   HYDROcodone   [facility] [address] [employer]

## 2019-03-05 NOTE — Telephone Encounter (Signed)
ERx 

## 2019-03-05 NOTE — Patient Outreach (Signed)
[facility]) Care Management  [place]   03/05/2019  [name] [birth year] [id number]  Reason for referral: Medication Management  Referral source: [facility] RN Current insurance: Unknown  PMHx includes but not limited to:  CAD s/p CABG '98, atrial fibrillation on , HFpEF, T2DM, HTN, HLD, venous insufficiency, hx DVT, PAD, admitted to [facility] 11/5-11/18 with duodenal diverticulitis and E.Coli bacteremia in the setting of E.Coli UTI, HF exacerbation, PNA, AKI, and afib, discharged to SNF.  Patient recently returned home and has had confusion with medication management.  Per notes, patient gives permission to speak with her granddaughter, [name].   Outreach:  Unsuccessful telephone call attempt #1 to patient and granddaughter.  HIPAA compliant voicemail left requesting a return call with granddaughter, no VM available to leave message for patient.   Plan:  -I will mail patient an unsuccessful outreach letter.  -I will make another outreach attempt to patient within 3-4 business days.    Ralene Bathe, PharmD, [place] [phone]

## 2019-03-05 NOTE — Telephone Encounter (Signed)
Name of Medication: Hydrocodone apap 5-325 mg Name of [facility]acy[facility] [place] or Written Date and Quantity:# 30 on 02/20/19  Last Office Visit and Type:02/25/19 virtual acute and 11/14/18 leg pain Next Office Visit and Type: none scheduled Last Controlled Substance Agreement Date: 10/16/2017 Last UDS:10/11/2017  I spoke with Mikki Santee at [facility] and pt is no longer a resident there.

## 2019-03-07 ENCOUNTER — Ambulatory Visit: Payer: Medicare Other

## 2019-03-08 ENCOUNTER — Other Ambulatory Visit: Payer: Self-pay | Admitting: Pharmacist

## 2019-03-08 ENCOUNTER — Ambulatory Visit: Payer: Self-pay | Admitting: Pharmacist

## 2019-03-08 ENCOUNTER — Other Ambulatory Visit: Payer: Self-pay | Admitting: *Deleted

## 2019-03-08 NOTE — Patient Outreach (Signed)
Outreach call to pt for follow up on home health, no answer to telephone and no option to leave voicemail,  RN CM mailed unsuccessful outreach letter to pt home.  PLAN Outreach pt in 3-4 business days  Jacqlyn Larsen [facility], [place] [phone]

## 2019-03-08 NOTE — Patient Outreach (Signed)
[place] [facility]) Care Management  [place]  03/08/2019  [name] 1927/04/20 [id number]  Reason for referral: Medication Management  Referral source: [facility] RN Current insurance: Unknown  PMHx includes but not limited to:  CAD s/p CABG '98, atrial fibrillation on , HFpEF, T2DM, HTN, HLD, venous insufficiency, hx DVT, PAD, admitted to [facility] 11/5-11/18 with duodenal diverticulitis and E.Coli bacteremia in the setting of E.Coli UTI, HF exacerbation, PNA, AKI, and afib, discharged to SNF.  Patient recently returned home and has had confusion with medication management.  Per notes, patient gives permission to speak with her granddaughter, [name].  Outreach:  Unsuccessful telephone call attempt #2 to patient.   HIPAA compliant voicemail left requesting a return call with granddaughter, unable to leave message with patient as no voicemail set up.  Home phone was picked up but call dropped.    Plan:  -I will make another outreach attempt to patient within 3-4 business days.    Ralene Bathe, PharmD, [place] [phone]    s

## 2019-03-11 ENCOUNTER — Telehealth: Payer: Self-pay | Admitting: *Deleted

## 2019-03-11 ENCOUNTER — Telehealth: Payer: Self-pay | Admitting: Cardiovascular Disease

## 2019-03-11 NOTE — Telephone Encounter (Signed)
Attempt made to contact patient to set up holter monitor.  No voicemail available / No VM DPR

## 2019-03-11 NOTE — Telephone Encounter (Signed)
New Message    Pt c/o medication issue:  1. Name of Medication: metoprolol tartrate (LOPRESSOR) 50 MG tablet  2. How are you currently taking this medication (dosage and times per day)? 37.5 2x daily she said then she was taking 50 mg and 37.5 mg together 2x daily   3. Are you having a reaction (difficulty breathing--STAT)? No  4. What is your medication issue? Pt says she had some confusion and was taking medication 50 mg and 37.5 mg  together 2 times daily. She says she got the 50 mg from the pharmacy and didn't realize they were the same medication but different dosage.  She says she has been falling asleep a lot and is really confused on what she should be taking    Please call

## 2019-03-11 NOTE — Telephone Encounter (Signed)
I spoke with pt. After much discussion it was determined she had a prescription for lopressor 37.5 mg twice daily (1.5 of the 25 mg tablets) and 75 mg twice daily (1.5 of the 50 mg tablets) . She had been taking both of these but only one tablet from each bottle twice daily to total 75 mg twice daily.  I told pt to put away 25 mg tablets. I told her to only use the 50 mg tablets and to take 1.5 of this twice daily. She reports she is feeling OK but does fall asleep while sitting in the chair in the afternoon. I told pt pharmacist and nurse from [facility] had been trying to reach her.  I gave her their contact numbers and asked her to call them

## 2019-03-12 ENCOUNTER — Other Ambulatory Visit: Payer: Self-pay | Admitting: Pharmacist

## 2019-03-12 ENCOUNTER — Ambulatory Visit: Payer: Self-pay

## 2019-03-12 ENCOUNTER — Ambulatory Visit: Payer: Self-pay | Admitting: Pharmacist

## 2019-03-12 NOTE — Patient Outreach (Signed)
[place]) Care Management  [place]   03/12/2019  [name] Jul 24, 1927 [id number]  Reason for referral:Medication Management  Referral source:[facility] RN Current insurance:Unknown  PMHx includes but not limited to:CAD s/p CABG '98, atrial fibrillation on chronic anticoagulation, HFpEF, T2DM, HTN, HLD, venous insufficiency, hx DVT, PAD, admitted to [facility] 11/5-11/18 with duodenal diverticulitis and E.Coli bacteremia in the setting of E.Coli UTI, HF exacerbation, PNA, AKI, and afib, discharged to SNF. Patient recently returned home and has had confusion with medication management. Per notes, patient gives permission to speak with her granddaughter, [name].  Outreach:  Successful telephone call with patient.  HIPAA identifiers verified.   Subjective:  Patient agreeable to review medications. She reports she does not have anyone who helps her with her medications and that her family "is a disappointment to me." She reports they do not help her out at all with her healthcare or transportation.   Patient reports she does not currently use a pillbox.  She uses [employer] red cups and fills them up every 4 days.  She is not interested in having medications prepared in compliance packs for her.  She states she does not like people telling her what to do.    Objective: The ASCVD Risk score Mikey Bussing DC Jr., et al., 2013) failed to calculate for the following reasons:   The 2013 ASCVD risk score is only valid for ages 14 to 31  Lab Results  Component Value Date   CREATININE 1.01 (H) 02/20/2019   CREATININE 0.71 02/19/2019   CREATININE 0.77 02/18/2019    Lab Results  Component Value Date   HGBA1C 6.9 (H) 02/08/2019    Lipid Panel     Component Value Date/Time   CHOL 115 02/04/2019 1409   TRIG 60.0 02/04/2019 1409   HDL 47.20 02/04/2019 1409   CHOLHDL 2 02/04/2019 1409   VLDL 12.0 02/04/2019 1409   LDLCALC 56 02/04/2019 1409    BP Readings from Last 3  Encounters:  02/26/19 122/66  02/20/19 102/69  02/07/19 95/62    Allergies  Allergen Reactions  . Metformin And Related Diarrhea    Medications Reviewed Today    Reviewed by Kassie Mends, RN (Registered Nurse) on 02/27/19 at 1147  Med List Status: <None>  Medication Order Taking? Sig Documenting Provider Last Dose Status Informant  acetaminophen (TYLENOL) 500 MG tablet [id number] Yes Take 500 mg by mouth every 6 (six) hours as needed for mild pain. [provider] Taking Active Other  ALPRAZolam Duanne Moron) 1 MG tablet [id number] Yes Take 0.5 tablets (0.5 mg total) by mouth 4 (four) times daily as needed for anxiety. Ezekiel Slocumb, DO Taking Active   apixaban (ELIQUIS) 5 MG TABS tablet [id number] Yes Take 1 tablet (5 mg total) by mouth 2 (two) times daily. Nahser, Wonda Cheng, MD Taking Active Other  collagenase Annitta Needs) ointment [id number] No Apply topically daily.  Patient not taking: Reported on 02/27/2019   Bettey Costa, MD Not Taking Active Other           Med Note Coralyn Mark, ASHELEY Cecille Rubin Dec 13, 2018  1:48 PM)    diclofenac sodium (VOLTAREN) 1 % GEL [id number] No Apply 1 application topically 3 (three) times daily.  Patient not taking: Reported on 02/27/2019   Ria Bush, MD Not Taking Active Other  diltiazem (CARDIZEM CD) 120 MG 24 hr capsule [id number] Yes Take 1 capsule (120 mg total) by mouth daily. Ria Bush, MD Taking Active   furosemide (  LASIX) 20 MG tablet [id number] Yes Take 2 tablets (40 mg total) by mouth daily. Ria Bush, MD Taking Active   glucosamine-chondroitin 500-400 MG tablet [id number] Yes Take 1 tablet by mouth daily.  [provider] Taking Active Other  glucose blood (ONETOUCH VERIO) test strip [id number] Yes USE TO TEST BLOOD SUGAR ONCE DAILY. DX:E11.9 Elayne Snare, MD Taking Active Other  HYDROcodone-acetaminophen (NORCO/VICODIN) 5-325 MG tablet [id number] Yes Take 0.5 tablets by mouth every 6 (six) hours as needed for moderate  pain. Ezekiel Slocumb, DO Taking Active   loperamide (IMODIUM) 2 MG capsule [id number] Yes Take 2 mg by mouth 4 (four) times daily as needed for diarrhea or loose stools. [provider] Taking Active Other  loratadine (CLARITIN) 10 MG tablet [id number] Yes Take 10 mg by mouth daily as needed for allergies. [provider] Taking Active Other  metoprolol tartrate (LOPRESSOR) 50 MG tablet [id number] Yes Take 1.5 tablets (75 mg total) by mouth 2 (two) times daily. [name] T, PA-C Taking Active   Multiple Vitamin (MULTIVITAMIN) tablet [id number] Yes Take 1 tablet by mouth daily. Patient reports takes a new vitamin to replace her centrum as recommended by her eye doctor. [provider] Taking Active Other  nitroGLYCERIN (NITROSTAT) 0.4 MG SL tablet [id number] Yes Place 1 tablet (0.4 mg total) under the tongue every 5 (five) minutes as needed for chest pain. Nahser, Wonda Cheng, MD Taking Active Other  nystatin cream (MYCOSTATIN) [id number] No Apply 1 application topically 2 (two) times daily.  Patient not taking: Reported on 02/27/2019   Ria Bush, MD Not Taking Active Other  nystatin-triamcinolone ointment [facility]) [id number] No APPLY TO AFFECTED AREA TWICE A DAY AS NEEDED FOR ITCHING  Patient not taking: Reported on 02/27/2019   Anastasio Auerbach, MD Not Taking Active Other  pantoprazole (PROTONIX) 40 MG tablet [id number] Yes Take 1 tablet (40 mg total) by mouth 2 (two) times daily. Ria Bush, MD Taking Active   pioglitazone (ACTOS) 30 MG tablet [id number] Yes TAKE 1 TABLET BY MOUTH EVERY DAY Elayne Snare, MD Taking Active Other  polyethylene glycol powder (GLYCOLAX/MIRALAX) powder [id number] Yes Take 17 g by mouth daily. Hold for diarrhea Ria Bush, MD Taking Active Other  pravastatin (PRAVACHOL) 20 MG tablet [id number] Yes TAKE 1 TABLET BY MOUTH EVERY DAY Ria Bush, MD Taking Active Other  Probiotic Product (ALIGN) 4 MG CAPS [id number] Yes Take 1  capsule (4 mg total) by mouth daily. Ria Bush, MD Taking Active Other  ramipril (ALTACE) 5 MG capsule [id number] Yes Take 1 capsule (5 mg total) by mouth daily. Ria Bush, MD Taking Active   traZODone (DESYREL) 150 MG tablet [id number] Yes Take 150 mg by mouth at bedtime.  [provider] Taking Active Other  triamcinolone cream (KENALOG) 0.1 % [id number] No APPLY TO AFFECTED AREA TWICE A DAY  Patient not taking: Reported on 02/27/2019   Ria Bush, MD Not Taking Active Other  vitamin B-12 (CYANOCOBALAMIN) 1000 MCG tablet [id number] Yes Take 1,000 mcg by mouth daily. [provider] Taking Active Other  vitamin E 400 UNIT capsule [id number] Yes Take 400 Units by mouth daily. [provider] Taking Active Other          Assessment: Drugs sorted by system:  Neurologic/Psychologic: alprazolam, trazodone  Hematologic: apixaban  Cardiovascular: diltiazem, furosemide, metoprolol, SL NTG, pravastin, ramipril   Pulmonary/Allergy: loratadine  Gastrointestinal:  Loperamide, probiotic, pantoprazole, polyethylene glycol  Endocrine: pioglitazone   Topical: diclofenac gel, nystatin  Pain: acetaminophen, hydrocodone-acetaminophen  Vitamins/Minerals/Supplements: glucosamine-chondroitin,  MVI, vitamin E  Medication Review Findings:  . Unknown adherence to medications.  Patient reports she is able to manage medications but did not recall what she had taken this morning.  She does seem confused at times but coherent at other times.  Patient may benefit from compliance packs and delivery service from local pharmacy or through [facility] however patient is not interested at all in this at this time.  We reviewed benefits of these services and I encouraged her to reach out to me or a provider if she changes her mind in the future.   . Reviewed all medications, indications and dosing with patient, reinforcing metoprolol dosing as noted patient reviewed this with  cardiology yesterday as well   Plan: . Will close [facility] pharmacy case as no further medication needs identified at this time.  Am happy to assist in the future as needed.     Ralene Bathe, PharmD, [place] [phone]

## 2019-03-13 ENCOUNTER — Other Ambulatory Visit: Payer: Self-pay | Admitting: *Deleted

## 2019-03-13 ENCOUNTER — Telehealth: Payer: Self-pay | Admitting: *Deleted

## 2019-03-13 DIAGNOSIS — I5033 Acute on chronic diastolic (congestive) heart failure: Secondary | ICD-10-CM

## 2019-03-13 DIAGNOSIS — R7881 Bacteremia: Secondary | ICD-10-CM

## 2019-03-13 DIAGNOSIS — M4807 Spinal stenosis, lumbosacral region: Secondary | ICD-10-CM

## 2019-03-13 DIAGNOSIS — B9620 Unspecified Escherichia coli [E. coli] as the cause of diseases classified elsewhere: Secondary | ICD-10-CM

## 2019-03-13 DIAGNOSIS — J189 Pneumonia, unspecified organism: Secondary | ICD-10-CM

## 2019-03-13 DIAGNOSIS — R2681 Unsteadiness on feet: Secondary | ICD-10-CM

## 2019-03-13 DIAGNOSIS — N390 Urinary tract infection, site not specified: Secondary | ICD-10-CM

## 2019-03-13 NOTE — Patient Outreach (Signed)
Outreach call to pt for follow up on home health, spoke with pt and she reports she has not heard from home health unless she did not answer the phone.  RN CM called Dr. Danise Mina and left voicemail for nurse triage line asking if home health has been ordered and which agency, requested return phone call  PLAN Follow up about home health  Jacqlyn Larsen [facility], [place] Coordinator [phone]

## 2019-03-13 NOTE — Telephone Encounter (Signed)
[name] with [facility] left a voicemail stating that home health was requested for patient prior to her leaving [facility]. [name] stated then they also sent a letter. [name] stated that they feel that patient needs home health. [name] stated that she spoke to the patient and was advised that no one has called her about this. [name] stated that patient does not care what agency is used.

## 2019-03-13 NOTE — Telephone Encounter (Signed)
Home health had not been ordered. I have placed new order for home health after recent hospitalization.

## 2019-03-14 ENCOUNTER — Other Ambulatory Visit: Payer: Self-pay

## 2019-03-14 ENCOUNTER — Ambulatory Visit: Payer: Self-pay | Admitting: Pharmacist

## 2019-03-14 ENCOUNTER — Ambulatory Visit: Payer: Self-pay

## 2019-03-14 NOTE — Telephone Encounter (Signed)
[facility] has accepted [name] and will be calling her soon to set up home visit. Patient has been notified.

## 2019-03-14 NOTE — Telephone Encounter (Signed)
Attempt made to contact patient regarding holter monitor.  No answer/ no voicemail. Will enroll patient for [employer] to mail a 3 day ZIO XT long term holter monitor to her home.

## 2019-03-14 NOTE — Patient Outreach (Signed)
[facility]) Care Management  03/14/2019  [name] Aug 25, 1927 [id number]   Attempted to contact patient's granddaughter today for follow up(see note from 03/05/19.) Left voicemail message.  Will attempt to reach again within four business days if no return call.    Ronn Melena, BSW Social Worker [phone]

## 2019-03-19 ENCOUNTER — Other Ambulatory Visit: Payer: Self-pay

## 2019-03-19 ENCOUNTER — Ambulatory Visit: Payer: Self-pay

## 2019-03-19 NOTE — Patient Outreach (Signed)
[facility] [facility]) Care Management  03/19/2019  [name] Nov 16, 1927 [id number]   In-basket message received from [facility], Jacqlyn Larsen, on 03/04/19 to outreach patient regarding need for long term assistance in the home.   Successful outreach to patient on 03/05/19: Talked with patient about in-home aide/personal care services, however, patient does not have Medicaid.  Explained to patient that Medicare does not cover pcs; patient unable to afford the cost for private hire aide services.  Talked with patient about Medicaid eligibility.  Based on income information patient provided, she may qualify for Medicaid.  Patient had a very difficult time understanding all of this information.  Inquired about someone in the family that I may call to discuss this information and that may be willing to assist with Medicaid application if patient wishes to apply.  Patient suggested that I call her granddaughter, [name], and consented to call Call was placed to granddaughter on 03/05/19. Informed her of conversation with patient.  Discussed Medicaid eligibility and recommended that patient apply if her income is under the limit.  Granddaughter stated that she will follow up with patient to discuss this further and call me back if she wishes to apply.   Second attempt today to follow up with granddaughter.  Left voicemail message.  Will make last attempt to follow up with her within four business days if no return call.  Ronn Melena, BSW Social Worker [phone]

## 2019-03-20 ENCOUNTER — Telehealth: Payer: Self-pay

## 2019-03-20 DIAGNOSIS — Z7984 Long term (current) use of oral hypoglycemic drugs: Secondary | ICD-10-CM | POA: Diagnosis not present

## 2019-03-20 DIAGNOSIS — Z86718 Personal history of other venous thrombosis and embolism: Secondary | ICD-10-CM | POA: Diagnosis not present

## 2019-03-20 DIAGNOSIS — Z951 Presence of aortocoronary bypass graft: Secondary | ICD-10-CM | POA: Diagnosis not present

## 2019-03-20 DIAGNOSIS — M810 Age-related osteoporosis without current pathological fracture: Secondary | ICD-10-CM | POA: Diagnosis not present

## 2019-03-20 DIAGNOSIS — B9620 Unspecified Escherichia coli [E. coli] as the cause of diseases classified elsewhere: Secondary | ICD-10-CM | POA: Diagnosis not present

## 2019-03-20 DIAGNOSIS — I4819 Other persistent atrial fibrillation: Secondary | ICD-10-CM | POA: Diagnosis not present

## 2019-03-20 DIAGNOSIS — I8390 Asymptomatic varicose veins of unspecified lower extremity: Secondary | ICD-10-CM | POA: Diagnosis not present

## 2019-03-20 DIAGNOSIS — E785 Hyperlipidemia, unspecified: Secondary | ICD-10-CM | POA: Diagnosis not present

## 2019-03-20 DIAGNOSIS — N390 Urinary tract infection, site not specified: Secondary | ICD-10-CM | POA: Diagnosis not present

## 2019-03-20 DIAGNOSIS — K5790 Diverticulosis of intestine, part unspecified, without perforation or abscess without bleeding: Secondary | ICD-10-CM | POA: Diagnosis not present

## 2019-03-20 DIAGNOSIS — R7881 Bacteremia: Secondary | ICD-10-CM | POA: Diagnosis not present

## 2019-03-20 DIAGNOSIS — I110 Hypertensive heart disease with heart failure: Secondary | ICD-10-CM | POA: Diagnosis not present

## 2019-03-20 DIAGNOSIS — D509 Iron deficiency anemia, unspecified: Secondary | ICD-10-CM | POA: Diagnosis not present

## 2019-03-20 DIAGNOSIS — I25119 Atherosclerotic heart disease of native coronary artery with unspecified angina pectoris: Secondary | ICD-10-CM | POA: Diagnosis not present

## 2019-03-20 DIAGNOSIS — E1151 Type 2 diabetes mellitus with diabetic peripheral angiopathy without gangrene: Secondary | ICD-10-CM | POA: Diagnosis not present

## 2019-03-20 DIAGNOSIS — F419 Anxiety disorder, unspecified: Secondary | ICD-10-CM | POA: Diagnosis not present

## 2019-03-20 DIAGNOSIS — J189 Pneumonia, unspecified organism: Secondary | ICD-10-CM | POA: Diagnosis not present

## 2019-03-20 DIAGNOSIS — F329 Major depressive disorder, single episode, unspecified: Secondary | ICD-10-CM | POA: Diagnosis not present

## 2019-03-20 DIAGNOSIS — K589 Irritable bowel syndrome without diarrhea: Secondary | ICD-10-CM | POA: Diagnosis not present

## 2019-03-20 DIAGNOSIS — Z7901 Long term (current) use of anticoagulants: Secondary | ICD-10-CM | POA: Diagnosis not present

## 2019-03-20 DIAGNOSIS — I5033 Acute on chronic diastolic (congestive) heart failure: Secondary | ICD-10-CM | POA: Diagnosis not present

## 2019-03-20 DIAGNOSIS — M5136 Other intervertebral disc degeneration, lumbar region: Secondary | ICD-10-CM | POA: Diagnosis not present

## 2019-03-20 DIAGNOSIS — G8929 Other chronic pain: Secondary | ICD-10-CM | POA: Diagnosis not present

## 2019-03-20 DIAGNOSIS — Z87891 Personal history of nicotine dependence: Secondary | ICD-10-CM | POA: Diagnosis not present

## 2019-03-20 NOTE — Telephone Encounter (Signed)
Mardene Celeste nurse with [facility] left v/m requesting verbal orders for [facility] SKN visits, med mgt and observations 1 x a wk for 6 wks.

## 2019-03-20 NOTE — Telephone Encounter (Signed)
Agree with this. Thanks.  

## 2019-03-21 ENCOUNTER — Other Ambulatory Visit: Payer: Self-pay | Admitting: *Deleted

## 2019-03-21 ENCOUNTER — Other Ambulatory Visit: Payer: Self-pay

## 2019-03-21 ENCOUNTER — Ambulatory Visit: Payer: Self-pay

## 2019-03-21 ENCOUNTER — Telehealth: Payer: Self-pay | Admitting: Family Medicine

## 2019-03-21 DIAGNOSIS — I4819 Other persistent atrial fibrillation: Secondary | ICD-10-CM | POA: Diagnosis not present

## 2019-03-21 DIAGNOSIS — B9620 Unspecified Escherichia coli [E. coli] as the cause of diseases classified elsewhere: Secondary | ICD-10-CM | POA: Diagnosis not present

## 2019-03-21 DIAGNOSIS — I25119 Atherosclerotic heart disease of native coronary artery with unspecified angina pectoris: Secondary | ICD-10-CM | POA: Diagnosis not present

## 2019-03-21 DIAGNOSIS — R7881 Bacteremia: Secondary | ICD-10-CM | POA: Diagnosis not present

## 2019-03-21 DIAGNOSIS — N390 Urinary tract infection, site not specified: Secondary | ICD-10-CM | POA: Diagnosis not present

## 2019-03-21 DIAGNOSIS — J189 Pneumonia, unspecified organism: Secondary | ICD-10-CM | POA: Diagnosis not present

## 2019-03-21 NOTE — Telephone Encounter (Signed)
[name], [facility], called.  [name] needs verbal orders for pt and she'd like to add occupational therapy.  Patient's taking medications wrong. When [name] got to the home, patient had already taken her pm pills. Occupational Therapy could go over her medications with her and do a locked pill box that won't open until a certain time. She'd like to get occupational therapy out tomorrow to help with pills.

## 2019-03-21 NOTE — Patient Outreach (Signed)
[facility]) Care Management  03/21/2019  [name] 11/15/1927 [id number]   In-basket message received from [facility], Jacqlyn Larsen, on 03/04/19 to outreach patient regarding need for long term assistance in the home.  Successful outreach to patient on 03/05/19: Talked with patient about in-home aide/personal care services, however, patient does not have Medicaid. Explained to patient that Medicare does not cover pcs; patient unable to afford the cost for private hire aide services. Talked with patient about Medicaid eligibility. Based on income information patient provided, she may qualify for Medicaid. Patient had a very difficult time understanding all of this information. Inquired about someone in the family that I may call to discuss this information and that may be willing to assist with Medicaid application if patient wishes to apply. Patient suggested that I call her granddaughter, [name], and consented to call Call was placed to granddaughter on 03/05/19. Informed her of conversation with patient. Discussed Medicaid eligibility and recommended that patient apply if her income is under the limit. Granddaughter stated that she will follow up with patient to discuss this further and call me back if she wishes to apply.   Third attempt today to follow up with granddaughter.  Left voicemail message.  Will close social work case if no response by 03/27/19.  Ronn Melena, BSW Social Worker [phone]

## 2019-03-21 NOTE — Patient Outreach (Signed)
Outreach call to pt for telephone assessment, no answer to telephone and received message voicemail not set up.  RN CM mailed unsuccessful outreach letter to pt home.  PLAN Outreach pt in 3-4 business days  Jacqlyn Larsen [facility], [place] [phone]

## 2019-03-21 NOTE — Telephone Encounter (Signed)
Spoke with Mardene Celeste informing her Dr. Darnell Level is giving verbal orders for services requested.

## 2019-03-22 ENCOUNTER — Telehealth: Payer: Self-pay

## 2019-03-22 DIAGNOSIS — R7881 Bacteremia: Secondary | ICD-10-CM | POA: Diagnosis not present

## 2019-03-22 DIAGNOSIS — I4819 Other persistent atrial fibrillation: Secondary | ICD-10-CM | POA: Diagnosis not present

## 2019-03-22 DIAGNOSIS — B9620 Unspecified Escherichia coli [E. coli] as the cause of diseases classified elsewhere: Secondary | ICD-10-CM | POA: Diagnosis not present

## 2019-03-22 DIAGNOSIS — J189 Pneumonia, unspecified organism: Secondary | ICD-10-CM | POA: Diagnosis not present

## 2019-03-22 DIAGNOSIS — N390 Urinary tract infection, site not specified: Secondary | ICD-10-CM | POA: Diagnosis not present

## 2019-03-22 DIAGNOSIS — I25119 Atherosclerotic heart disease of native coronary artery with unspecified angina pectoris: Secondary | ICD-10-CM | POA: Diagnosis not present

## 2019-03-22 NOTE — Telephone Encounter (Signed)
Agree with this. This was done earlier this morning?

## 2019-03-22 NOTE — Telephone Encounter (Signed)
Agree with this. Thanks.  

## 2019-03-22 NOTE — Telephone Encounter (Signed)
Spoke with Stacey informing her Dr. G is giving verbal orders for services requested.  

## 2019-03-22 NOTE — Telephone Encounter (Signed)
[name] with [facility], calling for verbal order for OT 1 x a week for 4 weeks. [name]'s CB [phone].

## 2019-03-22 NOTE — Telephone Encounter (Signed)
Lvm for [name] informing her Dr. Darnell Level is giving verbal orders for services requested.

## 2019-03-24 ENCOUNTER — Ambulatory Visit (INDEPENDENT_AMBULATORY_CARE_PROVIDER_SITE_OTHER): Payer: Medicare Other

## 2019-03-24 DIAGNOSIS — I4819 Other persistent atrial fibrillation: Secondary | ICD-10-CM

## 2019-03-25 DIAGNOSIS — R7881 Bacteremia: Secondary | ICD-10-CM | POA: Diagnosis not present

## 2019-03-25 DIAGNOSIS — B9620 Unspecified Escherichia coli [E. coli] as the cause of diseases classified elsewhere: Secondary | ICD-10-CM | POA: Diagnosis not present

## 2019-03-25 DIAGNOSIS — N390 Urinary tract infection, site not specified: Secondary | ICD-10-CM | POA: Diagnosis not present

## 2019-03-25 DIAGNOSIS — I25119 Atherosclerotic heart disease of native coronary artery with unspecified angina pectoris: Secondary | ICD-10-CM | POA: Diagnosis not present

## 2019-03-25 DIAGNOSIS — I4819 Other persistent atrial fibrillation: Secondary | ICD-10-CM | POA: Diagnosis not present

## 2019-03-25 DIAGNOSIS — J189 Pneumonia, unspecified organism: Secondary | ICD-10-CM | POA: Diagnosis not present

## 2019-03-26 ENCOUNTER — Other Ambulatory Visit: Payer: Self-pay | Admitting: *Deleted

## 2019-03-26 DIAGNOSIS — R7881 Bacteremia: Secondary | ICD-10-CM | POA: Diagnosis not present

## 2019-03-26 DIAGNOSIS — J189 Pneumonia, unspecified organism: Secondary | ICD-10-CM | POA: Diagnosis not present

## 2019-03-26 DIAGNOSIS — I4819 Other persistent atrial fibrillation: Secondary | ICD-10-CM | POA: Diagnosis not present

## 2019-03-26 DIAGNOSIS — I25119 Atherosclerotic heart disease of native coronary artery with unspecified angina pectoris: Secondary | ICD-10-CM | POA: Diagnosis not present

## 2019-03-26 DIAGNOSIS — N390 Urinary tract infection, site not specified: Secondary | ICD-10-CM | POA: Diagnosis not present

## 2019-03-26 DIAGNOSIS — B9620 Unspecified Escherichia coli [E. coli] as the cause of diseases classified elsewhere: Secondary | ICD-10-CM | POA: Diagnosis not present

## 2019-03-26 NOTE — Patient Outreach (Signed)
Outreach call to pt for telephone assessment, no answer to telephone and no option to leave voicemail.  PLAN Outreach pt in 3-4 business days  Jacqlyn Larsen [facility], [place] [phone]

## 2019-03-27 ENCOUNTER — Other Ambulatory Visit: Payer: Self-pay

## 2019-03-27 DIAGNOSIS — B9620 Unspecified Escherichia coli [E. coli] as the cause of diseases classified elsewhere: Secondary | ICD-10-CM | POA: Diagnosis not present

## 2019-03-27 DIAGNOSIS — N390 Urinary tract infection, site not specified: Secondary | ICD-10-CM | POA: Diagnosis not present

## 2019-03-27 DIAGNOSIS — I25119 Atherosclerotic heart disease of native coronary artery with unspecified angina pectoris: Secondary | ICD-10-CM | POA: Diagnosis not present

## 2019-03-27 DIAGNOSIS — R7881 Bacteremia: Secondary | ICD-10-CM | POA: Diagnosis not present

## 2019-03-27 DIAGNOSIS — I4819 Other persistent atrial fibrillation: Secondary | ICD-10-CM | POA: Diagnosis not present

## 2019-03-27 DIAGNOSIS — J189 Pneumonia, unspecified organism: Secondary | ICD-10-CM | POA: Diagnosis not present

## 2019-03-27 NOTE — Patient Outreach (Signed)
[facility]) Care Management  03/27/2019  ARVA SLAUGH 15-Aug-1927 [id number]   Social work discipline closure due to inability to maintain contact.   Ronn Melena, BSW Social Worker [phone]

## 2019-04-01 DIAGNOSIS — I25119 Atherosclerotic heart disease of native coronary artery with unspecified angina pectoris: Secondary | ICD-10-CM | POA: Diagnosis not present

## 2019-04-01 DIAGNOSIS — J189 Pneumonia, unspecified organism: Secondary | ICD-10-CM | POA: Diagnosis not present

## 2019-04-01 DIAGNOSIS — I4819 Other persistent atrial fibrillation: Secondary | ICD-10-CM | POA: Diagnosis not present

## 2019-04-01 DIAGNOSIS — B9620 Unspecified Escherichia coli [E. coli] as the cause of diseases classified elsewhere: Secondary | ICD-10-CM | POA: Diagnosis not present

## 2019-04-01 DIAGNOSIS — N390 Urinary tract infection, site not specified: Secondary | ICD-10-CM | POA: Diagnosis not present

## 2019-04-01 DIAGNOSIS — R7881 Bacteremia: Secondary | ICD-10-CM | POA: Diagnosis not present

## 2019-04-02 ENCOUNTER — Encounter: Payer: Self-pay | Admitting: *Deleted

## 2019-04-02 ENCOUNTER — Other Ambulatory Visit: Payer: Self-pay | Admitting: *Deleted

## 2019-04-02 NOTE — Patient Outreach (Signed)
Outreach call to pt for telephone assessment/  3rd attempt, spoke with pt, HIPAA verified, pt reports she now has [place] home health RN and PT and states her partner does not like people coming into the home because of Covid risks, they are being allowed into the home at present but pt not sure how long this will last due to her partner's uneasiness.  Pt reports weight is 139 pounds, CBG one hour after breakfast (ate cornflakes with 1 teaspoon sugar and waffle with syrup) was 200, pt states endocrinologist has talked with her numerous times about her diet,  RN CM reviewed carbohydrate modified diet and nutritious food choices.  Pt states she is breathing better.  Pt to see psychiatrist today.  [facility] CM Care Plan Problem One     Most Recent Value  Care Plan Problem One  Knowledge deficit relatd to recent pneumonia  Role Documenting the Problem One  Care Management Coordinator  Care Plan for Problem One  Active  [facility] Long Term Goal   Pt will verbalize ways to improve self care to prevent infections/ pneumonia within 60 days  [facility] Long Term Goal Start Date  02/27/19  Interventions for Problem One Long Term Goal  RN CM reinforced plan of care, pt states she has all medications and taking as prescribed, continues weighing and checking CBG.  [facility] CM Short Term Goal #1   Pt will verbalize signs/ symptoms infection and preventive measures within 30 days  [facility] CM Short Term Goal #1 Start Date  02/27/19  [facility] CM Short Term Goal #1 Met Date  04/02/19  Interventions for Short Term Goal #1  RN CM reinforced signs/ symptoms infection/ pneumonia    [facility] CM Care Plan Problem Two     Most Recent Value  Care Plan Problem Two  Knowledge deficit related to CHF  Role Documenting the Problem Two  Care Management Coordinator  Care Plan for Problem Two  Active  [facility] CM Short Term Goal #1   Pt will verbalize CHF action plan/ zones within 30 days  [facility] CM Short Term Goal #1 Start Date  02/27/19  Interventions for  Short Term Goal #2   RN CM reinforced HF action plan  [facility] CM Short Term Goal #2   Pt will weigh daily and record within 30 days  [facility] CM Short Term Goal #2 Start Date  02/27/19  [facility] CM Short Term Goal #2 Met Date  04/02/19  Interventions for Short Term Goal #2  Pt reports she is weighing daily      PLAN Outreach pt next month for telephone assessment  Jacqlyn Larsen [facility], [place] Coordinator [phone]

## 2019-04-03 DIAGNOSIS — J189 Pneumonia, unspecified organism: Secondary | ICD-10-CM | POA: Diagnosis not present

## 2019-04-03 DIAGNOSIS — B9620 Unspecified Escherichia coli [E. coli] as the cause of diseases classified elsewhere: Secondary | ICD-10-CM | POA: Diagnosis not present

## 2019-04-03 DIAGNOSIS — I25119 Atherosclerotic heart disease of native coronary artery with unspecified angina pectoris: Secondary | ICD-10-CM | POA: Diagnosis not present

## 2019-04-03 DIAGNOSIS — R7881 Bacteremia: Secondary | ICD-10-CM | POA: Diagnosis not present

## 2019-04-03 DIAGNOSIS — I4819 Other persistent atrial fibrillation: Secondary | ICD-10-CM | POA: Diagnosis not present

## 2019-04-03 DIAGNOSIS — N390 Urinary tract infection, site not specified: Secondary | ICD-10-CM | POA: Diagnosis not present

## 2019-04-04 DIAGNOSIS — J189 Pneumonia, unspecified organism: Secondary | ICD-10-CM | POA: Diagnosis not present

## 2019-04-04 DIAGNOSIS — R7881 Bacteremia: Secondary | ICD-10-CM | POA: Diagnosis not present

## 2019-04-04 DIAGNOSIS — B9620 Unspecified Escherichia coli [E. coli] as the cause of diseases classified elsewhere: Secondary | ICD-10-CM | POA: Diagnosis not present

## 2019-04-04 DIAGNOSIS — N390 Urinary tract infection, site not specified: Secondary | ICD-10-CM | POA: Diagnosis not present

## 2019-04-04 DIAGNOSIS — I4819 Other persistent atrial fibrillation: Secondary | ICD-10-CM | POA: Diagnosis not present

## 2019-04-04 DIAGNOSIS — I25119 Atherosclerotic heart disease of native coronary artery with unspecified angina pectoris: Secondary | ICD-10-CM | POA: Diagnosis not present

## 2019-04-08 DIAGNOSIS — I25119 Atherosclerotic heart disease of native coronary artery with unspecified angina pectoris: Secondary | ICD-10-CM | POA: Diagnosis not present

## 2019-04-08 DIAGNOSIS — N390 Urinary tract infection, site not specified: Secondary | ICD-10-CM | POA: Diagnosis not present

## 2019-04-08 DIAGNOSIS — B9620 Unspecified Escherichia coli [E. coli] as the cause of diseases classified elsewhere: Secondary | ICD-10-CM | POA: Diagnosis not present

## 2019-04-08 DIAGNOSIS — I4819 Other persistent atrial fibrillation: Secondary | ICD-10-CM | POA: Diagnosis not present

## 2019-04-08 DIAGNOSIS — J189 Pneumonia, unspecified organism: Secondary | ICD-10-CM | POA: Diagnosis not present

## 2019-04-08 DIAGNOSIS — R7881 Bacteremia: Secondary | ICD-10-CM | POA: Diagnosis not present

## 2019-04-09 DIAGNOSIS — B9620 Unspecified Escherichia coli [E. coli] as the cause of diseases classified elsewhere: Secondary | ICD-10-CM | POA: Diagnosis not present

## 2019-04-09 DIAGNOSIS — N390 Urinary tract infection, site not specified: Secondary | ICD-10-CM | POA: Diagnosis not present

## 2019-04-09 DIAGNOSIS — J189 Pneumonia, unspecified organism: Secondary | ICD-10-CM | POA: Diagnosis not present

## 2019-04-09 DIAGNOSIS — I4819 Other persistent atrial fibrillation: Secondary | ICD-10-CM | POA: Diagnosis not present

## 2019-04-09 DIAGNOSIS — I25119 Atherosclerotic heart disease of native coronary artery with unspecified angina pectoris: Secondary | ICD-10-CM | POA: Diagnosis not present

## 2019-04-09 DIAGNOSIS — R7881 Bacteremia: Secondary | ICD-10-CM | POA: Diagnosis not present

## 2019-04-10 ENCOUNTER — Telehealth: Payer: Self-pay | Admitting: Family Medicine

## 2019-04-10 DIAGNOSIS — R7881 Bacteremia: Secondary | ICD-10-CM | POA: Diagnosis not present

## 2019-04-10 DIAGNOSIS — I25119 Atherosclerotic heart disease of native coronary artery with unspecified angina pectoris: Secondary | ICD-10-CM | POA: Diagnosis not present

## 2019-04-10 DIAGNOSIS — N390 Urinary tract infection, site not specified: Secondary | ICD-10-CM | POA: Diagnosis not present

## 2019-04-10 DIAGNOSIS — J189 Pneumonia, unspecified organism: Secondary | ICD-10-CM | POA: Diagnosis not present

## 2019-04-10 DIAGNOSIS — I4819 Other persistent atrial fibrillation: Secondary | ICD-10-CM | POA: Diagnosis not present

## 2019-04-10 DIAGNOSIS — B9620 Unspecified Escherichia coli [E. coli] as the cause of diseases classified elsewhere: Secondary | ICD-10-CM | POA: Diagnosis not present

## 2019-04-10 NOTE — Telephone Encounter (Signed)
Pt called stating the home health nurse told her that her bp was to low and to stop taking her meds  for 3 days Ramipril & metoprolol  Pt wants to know what to do  Below is bp readings  She also stated she was dehydrated  1/3    108/66 1/4     140/56 1/5     120/70  Pt stated she is extremely tired and has no energy for the last 3 days

## 2019-04-11 NOTE — Telephone Encounter (Signed)
Lvm asking pt to call back.  Need to schedule in office visit for low BP.

## 2019-04-11 NOTE — Telephone Encounter (Signed)
Recommend sooner.

## 2019-04-11 NOTE — Telephone Encounter (Signed)
Recommend she hold ramipril for now, decrease metoprolol to 50mg  twice daily (was taking 75mg  bid).  Push fluids - small sips of water throughout the day.  Update [place] with BP readings next week. Also do recommend OV here or with cardiology to further manage medications.

## 2019-04-11 NOTE — Telephone Encounter (Signed)
Patient advised. Patient has an appointment with cardiologist scheduled for 05/27/2019. Is this ok or does patient needs to be seen sooner?

## 2019-04-12 DIAGNOSIS — N390 Urinary tract infection, site not specified: Secondary | ICD-10-CM | POA: Diagnosis not present

## 2019-04-12 DIAGNOSIS — B9620 Unspecified Escherichia coli [E. coli] as the cause of diseases classified elsewhere: Secondary | ICD-10-CM | POA: Diagnosis not present

## 2019-04-12 DIAGNOSIS — I4819 Other persistent atrial fibrillation: Secondary | ICD-10-CM | POA: Diagnosis not present

## 2019-04-12 DIAGNOSIS — R7881 Bacteremia: Secondary | ICD-10-CM | POA: Diagnosis not present

## 2019-04-12 DIAGNOSIS — I25119 Atherosclerotic heart disease of native coronary artery with unspecified angina pectoris: Secondary | ICD-10-CM | POA: Diagnosis not present

## 2019-04-12 DIAGNOSIS — J189 Pneumonia, unspecified organism: Secondary | ICD-10-CM | POA: Diagnosis not present

## 2019-04-12 NOTE — Telephone Encounter (Signed)
Lvm asking pt to call back.  Need to schedule in office visit for low BP.

## 2019-04-15 ENCOUNTER — Ambulatory Visit (INDEPENDENT_AMBULATORY_CARE_PROVIDER_SITE_OTHER): Payer: Medicare Other | Admitting: Family Medicine

## 2019-04-15 ENCOUNTER — Ambulatory Visit (INDEPENDENT_AMBULATORY_CARE_PROVIDER_SITE_OTHER)
Admission: RE | Admit: 2019-04-15 | Discharge: 2019-04-15 | Disposition: A | Discharge status: Home / Self Care | Payer: Medicare Other | Source: Ambulatory Visit | Attending: Family Medicine | Admitting: Family Medicine

## 2019-04-15 ENCOUNTER — Other Ambulatory Visit: Payer: Self-pay

## 2019-04-15 ENCOUNTER — Encounter: Payer: Self-pay | Admitting: Family Medicine

## 2019-04-15 VITALS — BP 122/66 | HR 104 | Ht <= 58 in | Wt 145.5 lb

## 2019-04-15 DIAGNOSIS — I5032 Chronic diastolic (congestive) heart failure: Secondary | ICD-10-CM

## 2019-04-15 DIAGNOSIS — I480 Paroxysmal atrial fibrillation: Secondary | ICD-10-CM

## 2019-04-15 DIAGNOSIS — I10 Essential (primary) hypertension: Secondary | ICD-10-CM | POA: Diagnosis not present

## 2019-04-15 DIAGNOSIS — S99922A Unspecified injury of left foot, initial encounter: Secondary | ICD-10-CM | POA: Insufficient documentation

## 2019-04-15 MED ORDER — METOPROLOL TARTRATE 50 MG PO TABS: 25.0000 mg | ORAL_TABLET | Freq: Two times a day (BID) | ORAL | 0 refills | Status: DC

## 2019-04-15 MED ORDER — RAMIPRIL 2.5 MG PO CAPS: 2.5000 mg | ORAL_CAPSULE | Freq: Every day | ORAL | 3 refills | Status: DC

## 2019-04-15 NOTE — Assessment & Plan Note (Signed)
Low BP readings at home in setting of recent antihypertensive dose increases - will decrease metoprolol down to 25mg  bid and decrease ramipril to 2.5mg  daily. I also asked patient to monitor sugars at home and letting me know if staying low.

## 2019-04-15 NOTE — Assessment & Plan Note (Addendum)
Sounds regular but with coupling and tachycardia. She has been off metoprolol since last week - will restart at 25mg  bid (prior on 75mg  dose).

## 2019-04-15 NOTE — Progress Notes (Signed)
This visit was conducted in person.  BP 122/66 (BP Location: Left Arm, Patient Position: Sitting, Cuff Size: Normal)   Pulse (!) 104   Ht 4\' 9"  (1.448 m)   Wt 145 lb 8 oz (66 kg)   SpO2 96%   BMI 31.49 kg/m   BP Readings from Last 3 Encounters:  04/15/19 122/66  02/26/19 122/66  02/20/19 102/69    CC: check blood pressure Subjective:    [name], female    DOB: 07/26/1927, 86 y.o.   MRN: [id number]  HPI: [name] is a 86 y.o. female presenting on 04/15/2019 for Decreased Blood Pressure (Pt has had some low BP readings per [facility].  Has stopped me metoprolol,     124/66 at home earlier today. )   See recent phone note for details.   BP readings low recently at home noted by home health RN - both metoprolol and ramipril were stopped. Denies HA, vision changes, SOB. No syncope, presyncope or dizziness/lightheadedness. Some leg swelling.   5d ago dropped ceramic bowl from microwave onto L great toe.   Increased stress at home - feels she has to take care of both friend Mendel Ryder and her partner [name]. They all live together.      Relevant past medical, surgical, family and social history reviewed and updated as indicated. Interim medical history since our last visit reviewed. Allergies and medications reviewed and updated. Outpatient Medications Prior to Visit  Medication Sig Dispense Refill  . acetaminophen (TYLENOL) 500 MG tablet Take 500 mg by mouth every 6 (six) hours as needed for mild pain.    Marland Kitchen ALPRAZolam (XANAX) 1 MG tablet Take 0.5 tablets (0.5 mg total) by mouth 4 (four) times daily as needed for anxiety. 30 tablet 0  . apixaban (ELIQUIS) 5 MG TABS tablet Take 1 tablet (5 mg total) by mouth 2 (two) times daily. 60 tablet 11  . diclofenac sodium (VOLTAREN) 1 % GEL Apply 1 application topically 3 (three) times daily. 1 Tube 1  . diltiazem (CARDIZEM CD) 120 MG 24 hr capsule Take 1 capsule (120 mg total) by mouth daily. 30 capsule 3  . furosemide  (LASIX) 20 MG tablet Take 2 tablets (40 mg total) by mouth daily. 60 tablet 3  . glucosamine-chondroitin 500-400 MG tablet Take 1 tablet by mouth daily.     Marland Kitchen glucose blood (ONETOUCH VERIO) test strip USE TO TEST BLOOD SUGAR ONCE DAILY. DX:E11.9 100 each 6  . HYDROcodone-acetaminophen (NORCO/VICODIN) 5-325 MG tablet Take 0.5 tablets by mouth every 6 (six) hours as needed for moderate pain. 30 tablet 0  . loperamide (IMODIUM) 2 MG capsule Take 2 mg by mouth 4 (four) times daily as needed for diarrhea or loose stools.    Marland Kitchen loratadine (CLARITIN) 10 MG tablet Take 10 mg by mouth daily as needed for allergies.    . Multiple Vitamin (MULTIVITAMIN) tablet Take 1 tablet by mouth daily. Patient reports takes a new vitamin to replace her centrum as recommended by her eye doctor.    . nitroGLYCERIN (NITROSTAT) 0.4 MG SL tablet Place 1 tablet (0.4 mg total) under the tongue every 5 (five) minutes as needed for chest pain. 25 tablet 3  . nystatin cream (MYCOSTATIN) Apply 1 application topically 2 (two) times daily. 45 g 0  . pantoprazole (PROTONIX) 40 MG tablet Take 1 tablet (40 mg total) by mouth 2 (two) times daily. 60 tablet 3  . pioglitazone (ACTOS) 30 MG tablet TAKE 1 TABLET  BY MOUTH EVERY DAY 30 tablet 11  . polyethylene glycol powder (GLYCOLAX/MIRALAX) powder Take 17 g by mouth daily. Hold for diarrhea 3350 g 1  . pravastatin (PRAVACHOL) 20 MG tablet TAKE 1 TABLET BY MOUTH EVERY DAY 90 tablet 1  . Probiotic Product (ALIGN) 4 MG CAPS Take 1 capsule (4 mg total) by mouth daily.    . traZODone (DESYREL) 150 MG tablet Take 150 mg by mouth at bedtime.     . vitamin B-12 (CYANOCOBALAMIN) 1000 MCG tablet Take 1,000 mcg by mouth daily.    . vitamin E 400 UNIT capsule Take 400 Units by mouth daily.    . metoprolol tartrate (LOPRESSOR) 50 MG tablet Take 1.5 tablets (75 mg total) by mouth 2 (two) times daily. (Patient not taking: Reported on 04/15/2019) 270 tablet 3  . ramipril (ALTACE) 5 MG capsule Take 1 capsule  (5 mg total) by mouth daily. (Patient not taking: Reported on 04/15/2019) 30 capsule 3   No facility-administered medications prior to visit.     Per HPI unless specifically indicated in ROS section below Review of Systems Objective:    BP 122/66 (BP Location: Left Arm, Patient Position: Sitting, Cuff Size: Normal)   Pulse (!) 104   Ht 4\' 9"  (1.448 m)   Wt 145 lb 8 oz (66 kg)   SpO2 96%   BMI 31.49 kg/m   Wt Readings from Last 3 Encounters:  04/15/19 145 lb 8 oz (66 kg)  02/27/19 141 lb (64 kg)  02/26/19 141 lb 1.9 oz (64 kg)    Physical Exam Vitals and nursing note reviewed.  Constitutional:      Appearance: Normal appearance. She is not ill-appearing.  Cardiovascular:     Rate and Rhythm: Regular rhythm. Tachycardia present.     Pulses: Normal pulses.     Heart sounds: Normal heart sounds. No murmur.     Comments: Coupling noted Pulmonary:     Effort: Pulmonary effort is normal. No respiratory distress.     Breath sounds: Normal breath sounds. No wheezing, rhonchi or rales.  Musculoskeletal:        General: Tenderness and signs of injury present.     Right lower leg: No edema.     Left lower leg: No edema.     Comments: L foot with bruising at toes, healing blister at 1st great toe, tender to palpation along 1st great toe and into metatarsal shafts  Skin:    General: Skin is warm and dry.     Findings: Bruising present. No rash.  Neurological:     Mental Status: She is alert.  Psychiatric:        Mood and Affect: Mood normal.        Behavior: Behavior normal.       LONG TERM MONITOR (3-14 DAYS)  Atrial Fib. Her HR is frequently greater than 100 when she is active.  Rare aberrently conducted beats    Assessment & Plan:  This visit occurred during the SARS-CoV-2 public health emergency.  Safety protocols were in place, including screening questions prior to the visit, additional usage of staff PPE, and extensive cleaning of exam room while observing appropriate  contact time as indicated for disinfecting solutions.   Problem List Items Addressed This Visit    Paroxysmal atrial fibrillation ([place])    Sounds regular but with coupling and tachycardia. She has been off metoprolol since last week - will restart at 25mg  bid (prior on 75mg  dose).  Relevant Medications   metoprolol tartrate (LOPRESSOR) 50 MG tablet   ramipril (ALTACE) 2.5 MG capsule   Injury of left foot including toes - Primary    After she dropped ceramic bowl at home - check foot films r/o fracture- clear on my read, will await radiologist evaluation.      Relevant Orders   DG Foot Complete Left   Hypertension    Low BP readings at home in setting of recent antihypertensive dose increases - will decrease metoprolol down to 25mg  bid and decrease ramipril to 2.5mg  daily. I also asked patient to monitor sugars at home and letting me know if staying low.       Relevant Medications   metoprolol tartrate (LOPRESSOR) 50 MG tablet   ramipril (ALTACE) 2.5 MG capsule   (HFpEF) heart failure with preserved ejection fraction (HCC)    Stable period - will start lower ramipril dose due to recent hypotension.       Relevant Medications   metoprolol tartrate (LOPRESSOR) 50 MG tablet   ramipril (ALTACE) 2.5 MG capsule       Meds ordered this encounter  Medications  . metoprolol tartrate (LOPRESSOR) 50 MG tablet    Sig: Take 0.5 tablets (25 mg total) by mouth 2 (two) times daily.    Dispense:  45 tablet    Refill:  0    Note new sig  . ramipril (ALTACE) 2.5 MG capsule    Sig: Take 1 capsule (2.5 mg total) by mouth daily.    Dispense:  30 capsule    Refill:  3    Note new sig   Orders Placed This Encounter  Procedures  . DG Foot Complete Left    Standing Status:   Future    Number of Occurrences:   1    Standing Expiration Date:   06/12/2020    Order Specific Question:   Reason for Exam (SYMPTOM  OR DIAGNOSIS REQUIRED)    Answer:   left foot pain after injury    Order  Specific Question:   Preferred imaging location?    Answer:   [facility]    Order Specific Question:   Radiology Contrast Protocol - do NOT remove file path    Answer:   [email or web address]    Patient Instructions  Let's decrease ramipril to 2.5mg  daily. This is a good medicine for the heart. Let's decrease metoprolol to 25mg  twice daily (1/2 tablet of 50mg ).  Monitor blood pressures at home, let me know if low blood pressure symptoms develop such as lightheadedness or feeling like blacking out.  Xray today of left foot.    Follow up plan: Return if symptoms worsen or fail to improve.  Ria Bush, MD

## 2019-04-15 NOTE — Telephone Encounter (Signed)
Pt scheduled today at 330

## 2019-04-15 NOTE — Assessment & Plan Note (Signed)
After she dropped ceramic bowl at home - check foot films r/o fracture- clear on my read, will await radiologist evaluation.

## 2019-04-15 NOTE — Patient Instructions (Addendum)
Let's decrease ramipril to 2.5mg  daily. This is a good medicine for the heart. Let's decrease metoprolol to 25mg  twice daily (1/2 tablet of 50mg ).  Monitor blood pressures at home, let me know if low blood pressure symptoms develop such as lightheadedness or feeling like blacking out.  Xray today of left foot.

## 2019-04-15 NOTE — Assessment & Plan Note (Signed)
Stable period - will start lower ramipril dose due to recent hypotension.

## 2019-04-16 DIAGNOSIS — N390 Urinary tract infection, site not specified: Secondary | ICD-10-CM | POA: Diagnosis not present

## 2019-04-16 DIAGNOSIS — I4819 Other persistent atrial fibrillation: Secondary | ICD-10-CM | POA: Diagnosis not present

## 2019-04-16 DIAGNOSIS — R7881 Bacteremia: Secondary | ICD-10-CM | POA: Diagnosis not present

## 2019-04-16 DIAGNOSIS — J189 Pneumonia, unspecified organism: Secondary | ICD-10-CM | POA: Diagnosis not present

## 2019-04-16 DIAGNOSIS — I25119 Atherosclerotic heart disease of native coronary artery with unspecified angina pectoris: Secondary | ICD-10-CM | POA: Diagnosis not present

## 2019-04-16 DIAGNOSIS — B9620 Unspecified Escherichia coli [E. coli] as the cause of diseases classified elsewhere: Secondary | ICD-10-CM | POA: Diagnosis not present

## 2019-04-17 ENCOUNTER — Other Ambulatory Visit: Payer: Self-pay | Admitting: Family Medicine

## 2019-04-17 DIAGNOSIS — S99922A Unspecified injury of left foot, initial encounter: Secondary | ICD-10-CM

## 2019-04-17 DIAGNOSIS — S93125A Dislocation of metatarsophalangeal joint of left lesser toe(s), initial encounter: Secondary | ICD-10-CM

## 2019-04-18 ENCOUNTER — Telehealth: Payer: Self-pay | Admitting: Cardiovascular Disease

## 2019-04-18 DIAGNOSIS — M79672 Pain in left foot: Secondary | ICD-10-CM | POA: Diagnosis not present

## 2019-04-18 DIAGNOSIS — L03032 Cellulitis of left toe: Secondary | ICD-10-CM | POA: Diagnosis not present

## 2019-04-18 NOTE — Telephone Encounter (Signed)
Spoke with patient to review monitor results. I asked patient to look at her pill bottles and verify that she is taking diltiazem 120 mg daily and metoprolol tartrate 25 mg twice daily. Patient states she is very confused by all her medications and all her doctor's appointments. She refers to an antibiotic but when spelled out to me it is the diltiazem. I asked her if someone helps her with her medications and she states "[facility]." Patient requests that someone tell her what all of her medications are used to treat. I offered to move her appointment with Dr. Acie Fredrickson up sooner so that I can go through her medications with her. She states the home nurse is coming soon and she thinks that she will help her. I advised her that Dr. Acie Fredrickson wants her to continue the dilitazem and metoprolol. She asks about Eliquis and I advised that she also needs to continue this medication and that her Rx is good until September. I advised her to call back with questions or concerns or if she decides she wants to come into the office before 2/22. She verbalized understanding and agreement and thanked me for the call.

## 2019-04-18 NOTE — Telephone Encounter (Signed)
-----   Message from Thayer Headings, MD sent at 04/16/2019  5:24 PM EST ----- Pt is in atrial fib.   HR is frequently > 100. Dr. Danise Mina added metoprolol back to her medications today  Will titrate as needed to keep HR controlled.  Cont. Dilt at current dose

## 2019-04-18 NOTE — Telephone Encounter (Signed)
Patient is returning call regarding long term monitor results.

## 2019-04-19 DIAGNOSIS — J189 Pneumonia, unspecified organism: Secondary | ICD-10-CM | POA: Diagnosis not present

## 2019-04-19 DIAGNOSIS — Z951 Presence of aortocoronary bypass graft: Secondary | ICD-10-CM | POA: Diagnosis not present

## 2019-04-19 DIAGNOSIS — I4819 Other persistent atrial fibrillation: Secondary | ICD-10-CM | POA: Diagnosis not present

## 2019-04-19 DIAGNOSIS — F419 Anxiety disorder, unspecified: Secondary | ICD-10-CM | POA: Diagnosis not present

## 2019-04-19 DIAGNOSIS — Z7984 Long term (current) use of oral hypoglycemic drugs: Secondary | ICD-10-CM | POA: Diagnosis not present

## 2019-04-19 DIAGNOSIS — F329 Major depressive disorder, single episode, unspecified: Secondary | ICD-10-CM | POA: Diagnosis not present

## 2019-04-19 DIAGNOSIS — Z87891 Personal history of nicotine dependence: Secondary | ICD-10-CM | POA: Diagnosis not present

## 2019-04-19 DIAGNOSIS — I5033 Acute on chronic diastolic (congestive) heart failure: Secondary | ICD-10-CM | POA: Diagnosis not present

## 2019-04-19 DIAGNOSIS — Z86718 Personal history of other venous thrombosis and embolism: Secondary | ICD-10-CM | POA: Diagnosis not present

## 2019-04-19 DIAGNOSIS — K5790 Diverticulosis of intestine, part unspecified, without perforation or abscess without bleeding: Secondary | ICD-10-CM | POA: Diagnosis not present

## 2019-04-19 DIAGNOSIS — K589 Irritable bowel syndrome without diarrhea: Secondary | ICD-10-CM | POA: Diagnosis not present

## 2019-04-19 DIAGNOSIS — I110 Hypertensive heart disease with heart failure: Secondary | ICD-10-CM | POA: Diagnosis not present

## 2019-04-19 DIAGNOSIS — I25119 Atherosclerotic heart disease of native coronary artery with unspecified angina pectoris: Secondary | ICD-10-CM | POA: Diagnosis not present

## 2019-04-19 DIAGNOSIS — E785 Hyperlipidemia, unspecified: Secondary | ICD-10-CM | POA: Diagnosis not present

## 2019-04-19 DIAGNOSIS — D509 Iron deficiency anemia, unspecified: Secondary | ICD-10-CM | POA: Diagnosis not present

## 2019-04-19 DIAGNOSIS — M810 Age-related osteoporosis without current pathological fracture: Secondary | ICD-10-CM | POA: Diagnosis not present

## 2019-04-19 DIAGNOSIS — M5136 Other intervertebral disc degeneration, lumbar region: Secondary | ICD-10-CM | POA: Diagnosis not present

## 2019-04-19 DIAGNOSIS — I8390 Asymptomatic varicose veins of unspecified lower extremity: Secondary | ICD-10-CM | POA: Diagnosis not present

## 2019-04-19 DIAGNOSIS — N390 Urinary tract infection, site not specified: Secondary | ICD-10-CM | POA: Diagnosis not present

## 2019-04-19 DIAGNOSIS — G8929 Other chronic pain: Secondary | ICD-10-CM | POA: Diagnosis not present

## 2019-04-19 DIAGNOSIS — E1151 Type 2 diabetes mellitus with diabetic peripheral angiopathy without gangrene: Secondary | ICD-10-CM | POA: Diagnosis not present

## 2019-04-19 DIAGNOSIS — R7881 Bacteremia: Secondary | ICD-10-CM | POA: Diagnosis not present

## 2019-04-19 DIAGNOSIS — Z7901 Long term (current) use of anticoagulants: Secondary | ICD-10-CM | POA: Diagnosis not present

## 2019-04-19 DIAGNOSIS — B9620 Unspecified Escherichia coli [E. coli] as the cause of diseases classified elsewhere: Secondary | ICD-10-CM | POA: Diagnosis not present

## 2019-04-22 DIAGNOSIS — I25119 Atherosclerotic heart disease of native coronary artery with unspecified angina pectoris: Secondary | ICD-10-CM | POA: Diagnosis not present

## 2019-04-22 DIAGNOSIS — N390 Urinary tract infection, site not specified: Secondary | ICD-10-CM | POA: Diagnosis not present

## 2019-04-22 DIAGNOSIS — R7881 Bacteremia: Secondary | ICD-10-CM | POA: Diagnosis not present

## 2019-04-22 DIAGNOSIS — B9620 Unspecified Escherichia coli [E. coli] as the cause of diseases classified elsewhere: Secondary | ICD-10-CM | POA: Diagnosis not present

## 2019-04-22 DIAGNOSIS — J189 Pneumonia, unspecified organism: Secondary | ICD-10-CM | POA: Diagnosis not present

## 2019-04-22 DIAGNOSIS — I4819 Other persistent atrial fibrillation: Secondary | ICD-10-CM | POA: Diagnosis not present

## 2019-04-22 NOTE — Telephone Encounter (Signed)
Agree.  We could have her see our pharmacist as well for medication reconciliation if she is agreeable. [name], PA-C    04/22/2019 1:56 PM

## 2019-04-23 NOTE — Telephone Encounter (Signed)
Called patient to see if she has gotten help sorting through her medications and she states the home nurse came and organized her medications. She denies complaints at this time.

## 2019-04-25 DIAGNOSIS — E1151 Type 2 diabetes mellitus with diabetic peripheral angiopathy without gangrene: Secondary | ICD-10-CM | POA: Diagnosis not present

## 2019-04-25 DIAGNOSIS — M2042 Other hammer toe(s) (acquired), left foot: Secondary | ICD-10-CM | POA: Diagnosis not present

## 2019-04-25 DIAGNOSIS — B351 Tinea unguium: Secondary | ICD-10-CM | POA: Diagnosis not present

## 2019-04-25 DIAGNOSIS — S9032XA Contusion of left foot, initial encounter: Secondary | ICD-10-CM | POA: Diagnosis not present

## 2019-04-25 DIAGNOSIS — L97521 Non-pressure chronic ulcer of other part of left foot limited to breakdown of skin: Secondary | ICD-10-CM | POA: Diagnosis not present

## 2019-04-25 DIAGNOSIS — M2041 Other hammer toe(s) (acquired), right foot: Secondary | ICD-10-CM | POA: Diagnosis not present

## 2019-04-25 DIAGNOSIS — L600 Ingrowing nail: Secondary | ICD-10-CM | POA: Diagnosis not present

## 2019-04-25 DIAGNOSIS — M2011 Hallux valgus (acquired), right foot: Secondary | ICD-10-CM | POA: Diagnosis not present

## 2019-04-25 DIAGNOSIS — M2012 Hallux valgus (acquired), left foot: Secondary | ICD-10-CM | POA: Diagnosis not present

## 2019-04-25 DIAGNOSIS — S90122A Contusion of left lesser toe(s) without damage to nail, initial encounter: Secondary | ICD-10-CM | POA: Diagnosis not present

## 2019-04-26 DIAGNOSIS — B9620 Unspecified Escherichia coli [E. coli] as the cause of diseases classified elsewhere: Secondary | ICD-10-CM | POA: Diagnosis not present

## 2019-04-26 DIAGNOSIS — J189 Pneumonia, unspecified organism: Secondary | ICD-10-CM | POA: Diagnosis not present

## 2019-04-26 DIAGNOSIS — R7881 Bacteremia: Secondary | ICD-10-CM | POA: Diagnosis not present

## 2019-04-26 DIAGNOSIS — N390 Urinary tract infection, site not specified: Secondary | ICD-10-CM | POA: Diagnosis not present

## 2019-04-26 DIAGNOSIS — I4819 Other persistent atrial fibrillation: Secondary | ICD-10-CM | POA: Diagnosis not present

## 2019-04-26 DIAGNOSIS — I25119 Atherosclerotic heart disease of native coronary artery with unspecified angina pectoris: Secondary | ICD-10-CM | POA: Diagnosis not present

## 2019-04-29 DIAGNOSIS — J189 Pneumonia, unspecified organism: Secondary | ICD-10-CM | POA: Diagnosis not present

## 2019-04-29 DIAGNOSIS — I25119 Atherosclerotic heart disease of native coronary artery with unspecified angina pectoris: Secondary | ICD-10-CM | POA: Diagnosis not present

## 2019-04-29 DIAGNOSIS — B9620 Unspecified Escherichia coli [E. coli] as the cause of diseases classified elsewhere: Secondary | ICD-10-CM | POA: Diagnosis not present

## 2019-04-29 DIAGNOSIS — I4819 Other persistent atrial fibrillation: Secondary | ICD-10-CM | POA: Diagnosis not present

## 2019-04-29 DIAGNOSIS — N390 Urinary tract infection, site not specified: Secondary | ICD-10-CM | POA: Diagnosis not present

## 2019-04-29 DIAGNOSIS — R7881 Bacteremia: Secondary | ICD-10-CM | POA: Diagnosis not present

## 2019-04-30 ENCOUNTER — Encounter: Payer: Self-pay | Admitting: *Deleted

## 2019-04-30 ENCOUNTER — Encounter: Payer: Self-pay | Admitting: Family Medicine

## 2019-04-30 ENCOUNTER — Other Ambulatory Visit: Payer: Self-pay | Admitting: *Deleted

## 2019-04-30 ENCOUNTER — Other Ambulatory Visit: Payer: Self-pay | Admitting: Family Medicine

## 2019-04-30 ENCOUNTER — Ambulatory Visit (INDEPENDENT_AMBULATORY_CARE_PROVIDER_SITE_OTHER): Payer: Medicare Other | Admitting: Family Medicine

## 2019-04-30 ENCOUNTER — Other Ambulatory Visit: Payer: Self-pay

## 2019-04-30 VITALS — BP 116/68 | HR 80 | Temp 97.5°F | Ht <= 58 in | Wt 144.4 lb

## 2019-04-30 DIAGNOSIS — N6310 Unspecified lump in the right breast, unspecified quadrant: Secondary | ICD-10-CM

## 2019-04-30 DIAGNOSIS — N63 Unspecified lump in unspecified breast: Secondary | ICD-10-CM | POA: Diagnosis not present

## 2019-04-30 DIAGNOSIS — N649 Disorder of breast, unspecified: Secondary | ICD-10-CM | POA: Diagnosis not present

## 2019-04-30 DIAGNOSIS — N6320 Unspecified lump in the left breast, unspecified quadrant: Secondary | ICD-10-CM

## 2019-04-30 NOTE — Progress Notes (Signed)
This visit was conducted in person.  BP 116/68 (BP Location: Left Arm, Patient Position: Sitting, Cuff Size: Normal)   Pulse 80   Temp (!) 97.5 F (36.4 C) (Temporal)   Ht 4\' 9"  (1.448 m)   Wt 144 lb 6 oz (65.5 kg)   SpO2 98%   BMI 31.24 kg/m    CC: check spot on breast Subjective:    [name], female    DOB: 12-19-1927, 86 y.o.   MRN: [id number]  HPI: [name] is a 86 y.o. female presenting on 04/30/2019 for Mass (C/o lump in bilateral breasts.  Also, states breasts have become heavier. )   Several week h/o R>L enlarging breasts associated with possible lumps bilateral outer breasts. Breasts feel uncomfortable. She worries this could be a cancer.   Denies fevers/chills, sweats, weight changes noted.   Mammogram 04/2018 - birads2, R breast US WNL - done for erythema of R breast.   Metoprolol restarted last week at a lower dose, ramipril also decreased - BP better with these changes.   Saw podiatry last week - no dislocation or facture of 2nd MTPJ. Thought bony contusion.      Relevant past medical, surgical, family and social history reviewed and updated as indicated. Interim medical history since our last visit reviewed. Allergies and medications reviewed and updated. Outpatient Medications Prior to Visit  Medication Sig Dispense Refill  . acetaminophen (TYLENOL) 500 MG tablet Take 500 mg by mouth every 6 (six) hours as needed for mild pain.    Marland Kitchen ALPRAZolam (XANAX) 1 MG tablet Take 0.5 tablets (0.5 mg total) by mouth 4 (four) times daily as needed for anxiety. 30 tablet 0  . apixaban (ELIQUIS) 5 MG TABS tablet Take 1 tablet (5 mg total) by mouth 2 (two) times daily. 60 tablet 11  . diclofenac sodium (VOLTAREN) 1 % GEL Apply 1 application topically 3 (three) times daily. 1 Tube 1  . diltiazem (CARDIZEM CD) 120 MG 24 hr capsule Take 1 capsule (120 mg total) by mouth daily. 30 capsule 3  . furosemide (LASIX) 20 MG tablet Take 2 tablets (40 mg total)  by mouth daily. 60 tablet 3  . glucosamine-chondroitin 500-400 MG tablet Take 1 tablet by mouth daily.     Marland Kitchen glucose blood (ONETOUCH VERIO) test strip USE TO TEST BLOOD SUGAR ONCE DAILY. DX:E11.9 100 each 6  . HYDROcodone-acetaminophen (NORCO/VICODIN) 5-325 MG tablet Take 0.5 tablets by mouth every 6 (six) hours as needed for moderate pain. 30 tablet 0  . loperamide (IMODIUM) 2 MG capsule Take 2 mg by mouth 4 (four) times daily as needed for diarrhea or loose stools.    Marland Kitchen loratadine (CLARITIN) 10 MG tablet Take 10 mg by mouth daily as needed for allergies.    . metoprolol tartrate (LOPRESSOR) 50 MG tablet Take 0.5 tablets (25 mg total) by mouth 2 (two) times daily. 45 tablet 0  . Multiple Vitamin (MULTIVITAMIN) tablet Take 1 tablet by mouth daily. Patient reports takes a new vitamin to replace her centrum as recommended by her eye doctor.    . nitroGLYCERIN (NITROSTAT) 0.4 MG SL tablet Place 1 tablet (0.4 mg total) under the tongue every 5 (five) minutes as needed for chest pain. 25 tablet 3  . nystatin cream (MYCOSTATIN) Apply 1 application topically 2 (two) times daily. 45 g 0  . pantoprazole (PROTONIX) 40 MG tablet Take 1 tablet (40 mg total) by mouth 2 (two) times daily. 60 tablet 3  .  pioglitazone (ACTOS) 30 MG tablet TAKE 1 TABLET BY MOUTH EVERY DAY 30 tablet 11  . polyethylene glycol powder (GLYCOLAX/MIRALAX) powder Take 17 g by mouth daily. Hold for diarrhea 3350 g 1  . pravastatin (PRAVACHOL) 20 MG tablet TAKE 1 TABLET BY MOUTH EVERY DAY 90 tablet 1  . Probiotic Product (ALIGN) 4 MG CAPS Take 1 capsule (4 mg total) by mouth daily.    . ramipril (ALTACE) 2.5 MG capsule Take 1 capsule (2.5 mg total) by mouth daily. 30 capsule 3  . traZODone (DESYREL) 150 MG tablet Take 150 mg by mouth at bedtime.     . vitamin B-12 (CYANOCOBALAMIN) 1000 MCG tablet Take 1,000 mcg by mouth daily.    . vitamin E 400 UNIT capsule Take 400 Units by mouth daily.     No facility-administered medications prior to  visit.     Per HPI unless specifically indicated in ROS section below Review of Systems Objective:    BP 116/68 (BP Location: Left Arm, Patient Position: Sitting, Cuff Size: Normal)   Pulse 80   Temp (!) 97.5 F (36.4 C) (Temporal)   Ht 4\' 9"  (1.448 m)   Wt 144 lb 6 oz (65.5 kg)   SpO2 98%   BMI 31.24 kg/m   Wt Readings from Last 3 Encounters:  04/30/19 144 lb 6 oz (65.5 kg)  04/15/19 145 lb 8 oz (66 kg)  02/27/19 141 lb (64 kg)    Physical Exam Vitals and nursing note reviewed.  Constitutional:      Appearance: Normal appearance. She is obese. She is not ill-appearing.  Chest:     Breasts:        Right: Mass present. No swelling, bleeding, inverted nipple, skin change or tenderness.        Left: Mass present. No swelling, bleeding, inverted nipple, skin change or tenderness.       Comments:  Pendulous breasts Tender small cystic mass R breast superior to areola Firm small lump on left upper breast near chest wall at 12 o clock  Lymphadenopathy:     Upper Body:     Right upper body: No supraclavicular, axillary or pectoral adenopathy.     Left upper body: No supraclavicular, axillary or pectoral adenopathy.  Neurological:     Mental Status: She is alert.       Assessment & Plan:  This visit occurred during the SARS-CoV-2 public health emergency.  Safety protocols were in place, including screening questions prior to the visit, additional usage of staff PPE, and extensive cleaning of exam room while observing appropriate contact time as indicated for disinfecting solutions.   Problem List Items Addressed This Visit    Breast mass - Primary    Lumps present bilateral upper breasts. Will refer for dx mammo and [place].       Relevant Orders   US BREAST LTD UNI RIGHT INC AXILLA   US BREAST LTD UNI LEFT INC AXILLA   MM DIAG BREAST TOMO BILATERAL    Other Visit Diagnoses    Disorder of breast, unspecified        Relevant Orders   MM DIAG BREAST TOMO BILATERAL        No orders of the defined types were placed in this encounter.  Orders Placed This Encounter  Procedures  . US BREAST LTD UNI RIGHT INC AXILLA    Standing Status:   Future    Standing Expiration Date:   06/27/2020    Order Specific Question:  Reason for Exam (SYMPTOM  OR DIAGNOSIS REQUIRED)    Answer:   breast lump, enlargement    Order Specific Question:   Preferred imaging location?    Answer:   [facility]  . US BREAST LTD UNI LEFT INC AXILLA    Standing Status:   Future    Standing Expiration Date:   06/27/2020    Order Specific Question:   Reason for Exam (SYMPTOM  OR DIAGNOSIS REQUIRED)    Answer:   breast lump, enlargement    Order Specific Question:   Preferred imaging location?    Answer:   [facility]  . MM DIAG BREAST TOMO BILATERAL    Standing Status:   Future    Standing Expiration Date:   06/27/2020    Order Specific Question:   Reason for Exam (SYMPTOM  OR DIAGNOSIS REQUIRED)    Answer:   bilat breast mass, enlargement    Order Specific Question:   Preferred imaging location?    Answer:   [facility]    Patient Instructions  See Rosaria Ferries to schedule your mammogram and ultrasound   Follow up plan: No follow-ups on file.  Ria Bush, MD

## 2019-04-30 NOTE — Assessment & Plan Note (Signed)
Lumps present bilateral upper breasts. Will refer for dx mammo and [place].

## 2019-04-30 NOTE — Patient Instructions (Signed)
See Rosaria Ferries to schedule your mammogram and ultrasound

## 2019-04-30 NOTE — Patient Outreach (Signed)
Outreach call to pt for telephone assessment, spoke with pt, HIPAA verified, pt reports she does have home health, pt previously states her partner would not allow anyone to come into the home, she says today " no, it's really no problem"  Pt to see primary care provider today.  Pt states weight is the same at 139 pounds and not weighing everyday, not checking CBG consistently and does not plan to, states she cannot remember most recent CBG reading, pt seems irritated to be asked these questions, seems in a hurry with the conversation.  RN CM discussed discharge plan and will close case today as pt does not feel she has any further goals to work towards.  RN CM mailed case closure letter to pt home and faxed case closure letter to primary care provider.  [facility] CM Care Plan Problem One     Most Recent Value  Care Plan Problem One  Knowledge deficit relatd to recent pneumonia  Role Documenting the Problem One  Care Management Coordinator  Care Plan for Problem One  Active  [facility] Long Term Goal   Pt will verbalize ways to improve self care to prevent infections/ pneumonia within 60 days  [facility] Long Term Goal Start Date  02/27/19  [facility] Long Term Goal Met Date  04/30/19  Interventions for Problem One Long Term Goal  RN CM reinforced plan of care with pt, pt reports home health is working with her, reports taking all medications as prescribed, denies any falls  [facility] CM Short Term Goal #1   Pt will verbalize signs/ symptoms infection and preventive measures within 30 days  [facility] CM Short Term Goal #1 Start Date  02/27/19  [facility] CM Short Term Goal #1 Met Date  04/02/19    [facility] CM Care Plan Problem Two     Most Recent Value  Care Plan Problem Two  Knowledge deficit related to CHF  Role Documenting the Problem Two  Care Management Coordinator  Care Plan for Problem Two  Active  [facility] CM Short Term Goal #1   Pt will verbalize CHF action plan/ zones within 30 days  [facility] CM Short Term Goal #1 Start Date  02/27/19  [facility] CM  Short Term Goal #1 Met Date   04/30/19  Interventions for Short Term Goal #2   HF plan reviewed with pt  [facility]N CM Short Term Goal #2   Pt will weigh daily and record within 30 days  [facility] CM Short Term Goal #2 Start Date  02/27/19  [facility] CM Short Term Goal #2 Met Date  04/02/19     Case closed  Jacqlyn Larsen [facility], BSN [place] Coordinator [phone]

## 2019-05-02 DIAGNOSIS — R7881 Bacteremia: Secondary | ICD-10-CM | POA: Diagnosis not present

## 2019-05-02 DIAGNOSIS — B9620 Unspecified Escherichia coli [E. coli] as the cause of diseases classified elsewhere: Secondary | ICD-10-CM | POA: Diagnosis not present

## 2019-05-02 DIAGNOSIS — N390 Urinary tract infection, site not specified: Secondary | ICD-10-CM | POA: Diagnosis not present

## 2019-05-02 DIAGNOSIS — I25119 Atherosclerotic heart disease of native coronary artery with unspecified angina pectoris: Secondary | ICD-10-CM | POA: Diagnosis not present

## 2019-05-02 DIAGNOSIS — I4819 Other persistent atrial fibrillation: Secondary | ICD-10-CM | POA: Diagnosis not present

## 2019-05-02 DIAGNOSIS — J189 Pneumonia, unspecified organism: Secondary | ICD-10-CM | POA: Diagnosis not present

## 2019-05-06 ENCOUNTER — Other Ambulatory Visit: Payer: Medicare Other

## 2019-05-06 DIAGNOSIS — B9620 Unspecified Escherichia coli [E. coli] as the cause of diseases classified elsewhere: Secondary | ICD-10-CM | POA: Diagnosis not present

## 2019-05-06 DIAGNOSIS — N390 Urinary tract infection, site not specified: Secondary | ICD-10-CM | POA: Diagnosis not present

## 2019-05-06 DIAGNOSIS — I25119 Atherosclerotic heart disease of native coronary artery with unspecified angina pectoris: Secondary | ICD-10-CM | POA: Diagnosis not present

## 2019-05-06 DIAGNOSIS — J189 Pneumonia, unspecified organism: Secondary | ICD-10-CM | POA: Diagnosis not present

## 2019-05-06 DIAGNOSIS — R7881 Bacteremia: Secondary | ICD-10-CM | POA: Diagnosis not present

## 2019-05-06 DIAGNOSIS — I4819 Other persistent atrial fibrillation: Secondary | ICD-10-CM | POA: Diagnosis not present

## 2019-05-09 ENCOUNTER — Other Ambulatory Visit: Payer: Self-pay

## 2019-05-09 NOTE — Telephone Encounter (Signed)
Name of Medication: Hydrocodone-APAP Name of Pharmacy: [facility] Last Fill or Written Date and Quantity: 03/05/19, #30 Last Office Visit and Type: 04/30/19, acute breast mass Next Office Visit and Type: none Last Controlled Substance Agreement Date: 10/11/17 Last UDS: 10/11/17

## 2019-05-10 LAB — HM MAMMOGRAPHY

## 2019-05-12 MED ORDER — HYDROCODONE-ACETAMINOPHEN 5-325 MG PO TABS: 0.5000 | ORAL_TABLET | Freq: Four times a day (QID) | ORAL | 0 refills | Status: DC | PRN

## 2019-05-12 NOTE — Telephone Encounter (Signed)
ERx 

## 2019-05-13 ENCOUNTER — Ambulatory Visit
Admission: RE | Admit: 2019-05-13 | Discharge: 2019-05-13 | Disposition: A | Discharge status: Home / Self Care | Payer: Medicare Other | Source: Ambulatory Visit | Attending: Family Medicine | Admitting: Family Medicine

## 2019-05-13 ENCOUNTER — Telehealth: Payer: Self-pay | Admitting: Family Medicine

## 2019-05-13 ENCOUNTER — Other Ambulatory Visit: Payer: Self-pay

## 2019-05-13 DIAGNOSIS — N6320 Unspecified lump in the left breast, unspecified quadrant: Secondary | ICD-10-CM

## 2019-05-13 DIAGNOSIS — N644 Mastodynia: Secondary | ICD-10-CM | POA: Diagnosis not present

## 2019-05-13 DIAGNOSIS — R928 Other abnormal and inconclusive findings on diagnostic imaging of breast: Secondary | ICD-10-CM | POA: Diagnosis not present

## 2019-05-13 DIAGNOSIS — N6310 Unspecified lump in the right breast, unspecified quadrant: Secondary | ICD-10-CM

## 2019-05-13 LAB — HM MAMMOGRAPHY

## 2019-05-13 NOTE — Telephone Encounter (Signed)
Patient is requesting a refill  Hydrocodone   Patient stated she has 1-2 tablets left   [facility]

## 2019-05-13 NOTE — Telephone Encounter (Signed)
Per pts med list hydrocodone apap 5-325 mg was sent electronically on 05/12/19.  I spoke with Anderson Malta at [facility] and hydrocodone apap 5-325 mg rx is ready for pick up. Pt voiced understanding.

## 2019-05-14 ENCOUNTER — Encounter: Payer: Self-pay | Admitting: Family Medicine

## 2019-05-14 DIAGNOSIS — F419 Anxiety disorder, unspecified: Secondary | ICD-10-CM

## 2019-05-14 DIAGNOSIS — Z7901 Long term (current) use of anticoagulants: Secondary | ICD-10-CM

## 2019-05-14 DIAGNOSIS — I25119 Atherosclerotic heart disease of native coronary artery with unspecified angina pectoris: Secondary | ICD-10-CM

## 2019-05-14 DIAGNOSIS — Z87891 Personal history of nicotine dependence: Secondary | ICD-10-CM

## 2019-05-14 DIAGNOSIS — J189 Pneumonia, unspecified organism: Secondary | ICD-10-CM

## 2019-05-14 DIAGNOSIS — Z7984 Long term (current) use of oral hypoglycemic drugs: Secondary | ICD-10-CM

## 2019-05-14 DIAGNOSIS — I4819 Other persistent atrial fibrillation: Secondary | ICD-10-CM | POA: Diagnosis not present

## 2019-05-14 DIAGNOSIS — I5033 Acute on chronic diastolic (congestive) heart failure: Secondary | ICD-10-CM

## 2019-05-14 DIAGNOSIS — M810 Age-related osteoporosis without current pathological fracture: Secondary | ICD-10-CM

## 2019-05-14 DIAGNOSIS — R7881 Bacteremia: Secondary | ICD-10-CM | POA: Diagnosis not present

## 2019-05-14 DIAGNOSIS — M5136 Other intervertebral disc degeneration, lumbar region: Secondary | ICD-10-CM

## 2019-05-14 DIAGNOSIS — K589 Irritable bowel syndrome without diarrhea: Secondary | ICD-10-CM

## 2019-05-14 DIAGNOSIS — N390 Urinary tract infection, site not specified: Secondary | ICD-10-CM | POA: Diagnosis not present

## 2019-05-14 DIAGNOSIS — B9620 Unspecified Escherichia coli [E. coli] as the cause of diseases classified elsewhere: Secondary | ICD-10-CM | POA: Diagnosis not present

## 2019-05-14 DIAGNOSIS — I110 Hypertensive heart disease with heart failure: Secondary | ICD-10-CM

## 2019-05-14 DIAGNOSIS — Z951 Presence of aortocoronary bypass graft: Secondary | ICD-10-CM

## 2019-05-14 DIAGNOSIS — E785 Hyperlipidemia, unspecified: Secondary | ICD-10-CM

## 2019-05-14 DIAGNOSIS — F329 Major depressive disorder, single episode, unspecified: Secondary | ICD-10-CM

## 2019-05-14 DIAGNOSIS — Z86718 Personal history of other venous thrombosis and embolism: Secondary | ICD-10-CM

## 2019-05-14 DIAGNOSIS — K5790 Diverticulosis of intestine, part unspecified, without perforation or abscess without bleeding: Secondary | ICD-10-CM

## 2019-05-14 DIAGNOSIS — G8929 Other chronic pain: Secondary | ICD-10-CM

## 2019-05-14 DIAGNOSIS — E1151 Type 2 diabetes mellitus with diabetic peripheral angiopathy without gangrene: Secondary | ICD-10-CM

## 2019-05-14 DIAGNOSIS — D509 Iron deficiency anemia, unspecified: Secondary | ICD-10-CM

## 2019-05-14 DIAGNOSIS — I8390 Asymptomatic varicose veins of unspecified lower extremity: Secondary | ICD-10-CM

## 2019-05-16 ENCOUNTER — Ambulatory Visit: Payer: BLUE CROSS/BLUE SHIELD | Admitting: Endocrinology

## 2019-05-20 ENCOUNTER — Other Ambulatory Visit: Payer: Self-pay

## 2019-05-20 ENCOUNTER — Ambulatory Visit (INDEPENDENT_AMBULATORY_CARE_PROVIDER_SITE_OTHER): Payer: Medicare Other | Admitting: Family Medicine

## 2019-05-20 ENCOUNTER — Encounter: Payer: Self-pay | Admitting: Family Medicine

## 2019-05-20 VITALS — BP 112/68 | HR 60 | Temp 96.2°F | Resp 20 | Ht <= 58 in | Wt 146.3 lb

## 2019-05-20 DIAGNOSIS — I83222 Varicose veins of left lower extremity with both ulcer of calf and inflammation: Secondary | ICD-10-CM

## 2019-05-20 DIAGNOSIS — L97221 Non-pressure chronic ulcer of left calf limited to breakdown of skin: Secondary | ICD-10-CM

## 2019-05-20 DIAGNOSIS — I480 Paroxysmal atrial fibrillation: Secondary | ICD-10-CM

## 2019-05-20 DIAGNOSIS — I10 Essential (primary) hypertension: Secondary | ICD-10-CM | POA: Diagnosis not present

## 2019-05-20 MED ORDER — METOPROLOL TARTRATE 50 MG PO TABS: 50.0000 mg | ORAL_TABLET | Freq: Two times a day (BID) | ORAL | 1 refills | Status: DC

## 2019-05-20 NOTE — Assessment & Plan Note (Signed)
Slowly healing ulcer - without signs of infection.  Dress with petroleum gauze dressing. Home care instructions reviewed. Update if not improving with this. RTC 2 wks wound check.

## 2019-05-20 NOTE — Patient Instructions (Addendum)
Continue dressing change daily with vaseline gauze (provided today).  Let us know if not healing with this, we may refer you back to wound clinic.  Return in 2 weeks for wound recheck.

## 2019-05-20 NOTE — Assessment & Plan Note (Signed)
Continues eliquis.  

## 2019-05-20 NOTE — Assessment & Plan Note (Signed)
Upon discussion with patient, she states she's been taking metoprolol 50mg  BID (although our med list had 25mg  bid). Will continue this dose given stable vital signs today - however low threshold to return to 25 or 37.5mg  bid dose if necessary (ie bradycardia develops or fatigue noted).

## 2019-05-20 NOTE — Progress Notes (Signed)
This visit was conducted in person.  BP 112/68   Pulse 60   Temp (!) 96.2 F (35.7 C) (Temporal)   Resp 20   Ht 4\' 9"  (1.448 m)   Wt 146 lb 4.8 oz (66.4 kg)   SpO2 96%   BMI 31.66 kg/m    CC: check spot on left leg "I messed my leg up again"  Subjective:    [name], female    DOB: Aug 27, 1927, 86 y.o.   MRN: [id number]  HPI: [name] is a 86 y.o. female presenting on 05/20/2019 for infection spot (left leg)   DOI: 1 wk ago hit L leg on dresser in her bedroom - skin wound to L lateral leg initially bled but quickly stopped with compression.   Chronic venous insufficiency with h/o bleeding venous ulcer slow to heal. She has recently been followed by wound clinic for this.   She has previously had good success with vaseline impregnated gauze but ran out of this.   She state she's taking metoprolol 50mg  twice daily.       Relevant past medical, surgical, family and social history reviewed and updated as indicated. Interim medical history since our last visit reviewed. Allergies and medications reviewed and updated. Outpatient Medications Prior to Visit  Medication Sig Dispense Refill  . acetaminophen (TYLENOL) 500 MG tablet Take 500 mg by mouth every 6 (six) hours as needed for mild pain.    Marland Kitchen ALPRAZolam (XANAX) 1 MG tablet Take 0.5 tablets (0.5 mg total) by mouth 4 (four) times daily as needed for anxiety. 30 tablet 0  . apixaban (ELIQUIS) 5 MG TABS tablet Take 1 tablet (5 mg total) by mouth 2 (two) times daily. 60 tablet 11  . diclofenac sodium (VOLTAREN) 1 % GEL Apply 1 application topically 3 (three) times daily. 1 Tube 1  . diltiazem (CARDIZEM CD) 120 MG 24 hr capsule Take 1 capsule (120 mg total) by mouth daily. 30 capsule 3  . furosemide (LASIX) 20 MG tablet Take 2 tablets (40 mg total) by mouth daily. 60 tablet 3  . glucosamine-chondroitin 500-400 MG tablet Take 1 tablet by mouth daily.     Marland Kitchen glucose blood (ONETOUCH VERIO) test strip USE TO  TEST BLOOD SUGAR ONCE DAILY. DX:E11.9 100 each 6  . HYDROcodone-acetaminophen (NORCO/VICODIN) 5-325 MG tablet Take 0.5 tablets by mouth every 6 (six) hours as needed for moderate pain. 30 tablet 0  . loperamide (IMODIUM) 2 MG capsule Take 2 mg by mouth 4 (four) times daily as needed for diarrhea or loose stools.    Marland Kitchen loratadine (CLARITIN) 10 MG tablet Take 10 mg by mouth daily as needed for allergies.    . Multiple Vitamin (MULTIVITAMIN) tablet Take 1 tablet by mouth daily. Patient reports takes a new vitamin to replace her centrum as recommended by her eye doctor.    . nitroGLYCERIN (NITROSTAT) 0.4 MG SL tablet Place 1 tablet (0.4 mg total) under the tongue every 5 (five) minutes as needed for chest pain. 25 tablet 3  . nystatin cream (MYCOSTATIN) Apply 1 application topically 2 (two) times daily. 45 g 0  . pantoprazole (PROTONIX) 40 MG tablet Take 1 tablet (40 mg total) by mouth 2 (two) times daily. 60 tablet 3  . pioglitazone (ACTOS) 30 MG tablet TAKE 1 TABLET BY MOUTH EVERY DAY 30 tablet 11  . polyethylene glycol powder (GLYCOLAX/MIRALAX) powder Take 17 g by mouth daily. Hold for diarrhea 3350 g 1  . pravastatin (PRAVACHOL)  20 MG tablet TAKE 1 TABLET BY MOUTH EVERY DAY 90 tablet 1  . Probiotic Product (ALIGN) 4 MG CAPS Take 1 capsule (4 mg total) by mouth daily.    . ramipril (ALTACE) 2.5 MG capsule Take 1 capsule (2.5 mg total) by mouth daily. 30 capsule 3  . traZODone (DESYREL) 150 MG tablet Take 150 mg by mouth at bedtime.     . vitamin B-12 (CYANOCOBALAMIN) 1000 MCG tablet Take 1,000 mcg by mouth daily.    . vitamin E 400 UNIT capsule Take 400 Units by mouth daily.    . metoprolol tartrate (LOPRESSOR) 50 MG tablet Take 0.5 tablets (25 mg total) by mouth 2 (two) times daily. 45 tablet 0   No facility-administered medications prior to visit.     Per HPI unless specifically indicated in ROS section below Review of Systems Objective:    BP 112/68   Pulse 60   Temp (!) 96.2 F (35.7 C)  (Temporal)   Resp 20   Ht 4\' 9"  (1.448 m)   Wt 146 lb 4.8 oz (66.4 kg)   SpO2 96%   BMI 31.66 kg/m   Wt Readings from Last 3 Encounters:  05/20/19 146 lb 4.8 oz (66.4 kg)  04/30/19 144 lb 6 oz (65.5 kg)  04/15/19 145 lb 8 oz (66 kg)    Physical Exam Vitals and nursing note reviewed.  Constitutional:      Appearance: Normal appearance. She is not ill-appearing.  Cardiovascular:     Rate and Rhythm: Normal rate and regular rhythm.     Pulses: Normal pulses.     Heart sounds: Normal heart sounds. No murmur.  Musculoskeletal:     Right lower leg: No edema.     Left lower leg: No edema.     Comments:  Compression stockings in place Small venous ulcer present L lateral leg, dry not oozing or bleeding, no surrounding erythema  Ecchymosis at lower left lateral leg   Neurological:     Mental Status: She is alert.    Wound dressing: Wound dressed with petroleum impregnated gauze then covered with kerlex, pt tolerated well. Home care instructions provided    Assessment & Plan:  This visit occurred during the SARS-CoV-2 public health emergency.  Safety protocols were in place, including screening questions prior to the visit, additional usage of staff PPE, and extensive cleaning of exam room while observing appropriate contact time as indicated for disinfecting solutions.   Problem List Items Addressed This Visit    Varicose veins of left lower extremity with inflammation, with ulcer of calf limited to breakdown of skin ([place]) - Primary    Slowly healing ulcer - without signs of infection.  Dress with petroleum gauze dressing. Home care instructions reviewed. Update if not improving with this. RTC 2 wks wound check.      Relevant Medications   metoprolol tartrate (LOPRESSOR) 50 MG tablet   Paroxysmal atrial fibrillation (HCC)    Continues eliquis.       Relevant Medications   metoprolol tartrate (LOPRESSOR) 50 MG tablet   Hypertension    Upon discussion with patient, she states  she's been taking metoprolol 50mg  BID (although our med list had 25mg  bid). Will continue this dose given stable vital signs today - however low threshold to return to 25 or 37.5mg  bid dose if necessary (ie bradycardia develops or fatigue noted).      Relevant Medications   metoprolol tartrate (LOPRESSOR) 50 MG tablet  Meds ordered this encounter  Medications  . metoprolol tartrate (LOPRESSOR) 50 MG tablet    Sig: Take 1 tablet (50 mg total) by mouth 2 (two) times daily.    Dispense:  180 tablet    Refill:  1    Note new sig   No orders of the defined types were placed in this encounter.   Patient Instructions  Continue dressing change daily with vaseline gauze (provided today).  Let us know if not healing with this, we may refer you back to wound clinic.  Return in 2 weeks for wound recheck.   Follow up plan: Return in about 2 weeks (around 06/03/2019) for follow up visit.  Ria Bush, MD

## 2019-05-22 ENCOUNTER — Other Ambulatory Visit: Payer: Self-pay | Admitting: Family Medicine

## 2019-05-22 NOTE — Telephone Encounter (Signed)
Patient called today She stated she is needing her "infection medication"  Patient said this was prescribed to her when she was in the office 2/15 /  Patient said she is completely out and now her leg is infected because she hit it... Please advise???

## 2019-05-24 NOTE — Telephone Encounter (Signed)
Spoke with pt for an update.  Says she was calling about the wound on her left leg.  States the area seems to be getting better but it still hurts.  Says she is changing bandages but only has one more.  When she change bandage yesterday, she notice a Camerer blood in center of wound so she was asking if that means area is infected.

## 2019-05-24 NOTE — Telephone Encounter (Signed)
I'm glad it's healing ok - continue bandage changes daily. Slight blood doesn't mean infection - monitor for streaking redness around wound. She should be able to find petroleum gauze at local pharmacy (wound care section) or available through [facility]. Does she have someone to help her with this?

## 2019-05-24 NOTE — Telephone Encounter (Addendum)
Spoke with pt relaying Dr. G's message.  Pt verbalizes understanding and expresses her thanks.  

## 2019-05-24 NOTE — Telephone Encounter (Signed)
Plz call for update.  When I saw her 2/15 there was no signs of skin infection.  Any new symptoms since seen? How is wound healing with wound care at home?

## 2019-05-27 ENCOUNTER — Ambulatory Visit (INDEPENDENT_AMBULATORY_CARE_PROVIDER_SITE_OTHER): Payer: Medicare Other | Admitting: Cardiovascular Disease

## 2019-05-27 ENCOUNTER — Encounter: Payer: Self-pay | Admitting: Cardiovascular Disease

## 2019-05-27 ENCOUNTER — Other Ambulatory Visit: Payer: Self-pay

## 2019-05-27 VITALS — BP 80/42 | HR 61 | Ht <= 58 in | Wt 144.0 lb

## 2019-05-27 DIAGNOSIS — I480 Paroxysmal atrial fibrillation: Secondary | ICD-10-CM

## 2019-05-27 DIAGNOSIS — I25119 Atherosclerotic heart disease of native coronary artery with unspecified angina pectoris: Secondary | ICD-10-CM

## 2019-05-27 NOTE — Patient Instructions (Signed)
Medication Instructions:  Your physician has recommended you make the following change in your medication:  STOP Diltiazem (Cardizem) STOP Altace (Ramipril)  *If you need a refill on your cardiac medications before your next appointment, please call your pharmacy*  Lab Work: None Ordered If you have labs (blood work) drawn today and your tests are completely normal, you will receive your results only by: Marland Kitchen MyChart Message (if you have MyChart) OR . A paper copy in the mail If you have any lab test that is abnormal or we need to change your treatment, we will call you to review the results.   Testing/Procedures: None Ordered   Follow-Up: At [facility], you and your health needs are our priority.  As part of our continuing mission to provide you with exceptional heart care, we have created designated Provider Care Teams.  These Care Teams include your primary Cardiologist (physician) and Advanced Practice Providers (APPs -  Physician Assistants and Nurse Practitioners) who all work together to provide you with the care you need, when you need it.  Your next appointment:   6 month(s)  The format for your next appointment:   In Person  Provider:   [name], PA-C or Robbie Lis, PA-C

## 2019-05-27 NOTE — Progress Notes (Signed)
[name] Date of Birth  [birth year] [name] [address]    [place],   [address] [phone]  Fax  [phone]   Problem list 1. Coronary artery disease-status post CABG in 1996 2. Diabetes mellitus 3. Hypertension 4. Hyperlipidemia 5. Right  DVT.    [name] is an 86 yo female with a Hx of CAD, CABG ( November 09, 1994), diabetes mellitus, anxiety, HTN, hyperlipidemia.   She's been under a lot of stress recently. Her son died last year. She's under a lot of stress with the extra expenses involved such as his funeral. She presented with some CP in December.  A stress myoview revealed lateral ischemia.  We offered cardiac cath but she did not want to have the cath. She presents today without any particular complaints. She has not had any episodes of chest pain or shortness of breath since I saw her a month ago. She does have occasional episodes of tachycardia. She also notes that she has some anxiety. She was last seen in January after having an abnormal stress test that revealed anterior apical ischemia. She refused cardiac catheterization at that time. She has not had any recent episodes of chest pain.  She does not want to have a cardiac catheterization since she's feeling so well.  She has had some chest burning that is nearly constant.  I suggest several times that she should consider getting the cath done and she changed to subject.  September 04, 2012:  [name] is doing well from a cardiac standpoint.  She is still working - despite having lots of back pain.  No CP .  She had some CP several years ago and had a mildly abnormal myoview.  She did not want to have a cardiac cath at that time and has not had any significant epidoses of CP since that time.  She continues to be under lots of stress  She complains of back pain, stomach pain, burning in her legs.    Nov. 24, 2014:  Pt is doing OK from a cardiac standpoint.  Lots of stress - she has been  running the [employer] by herself for the past several months.  She is not getting enough rest.  Sleeping 4 1/2 hours at night - tries to get a nap for an hour.    September 09, 2013:  [name] is doing ok.  She is under lots of stress as usual.    Nov. 10, 2015:  [name] presents with various aches and pains.  She has a vericose vein in her leg that bled. She is on Xarelto for a right leg DVT on Sept. 30. .   CT angio a week later was negative for PE.   She has lots of anxiety.  Has lots of chest pain.  The pain is not pleuretic.  The pain is not associated with exercise.  She does not have any pain while she is working but more often occurs with stress.   She is living with 2 elderly men now and has lots of stressful issues relating to that.    This seems to be a major cause of her pain.  She does have 86 year old SVGs  September 23, 2014:  Doing well from a cardiac standpoint. Lots of anxiety .  Has a open right heel ulcer.   Jan. 9, 2017  [name] has been doing ok No CP.  No dyspnea Has had right ankle pain and swelling  Occasionally has faster than normal heart rate,  No associated dyspnea , CP or dizziness.  Has had an upper respiratory tract infection  Recently   October 19, 2015:  Has had a UTI since I last saw her.  Was in the hospital  Had new A-fib with RVR with the hospitalization  Has converted back to NSR .   Aug 03, 2016:  No serious cardiac symptoms.   Has occasional episodes of tachycardia   February 21, 2018:  Doing well from a cardiac standpoint .   Seen with Granddaughter in [place]. Has some palpitations.  Especially when she gets up too fast.    April 09, 2018:  Has redness and swelling of her right leg, Has an open woulnd,    Dropped a knife on her leg. Has finished her Abx treatment .    Sept. 10., 2020  [name] is seen for follow up of her CAD and leg edema  Feels that her HR is irreglual, Has been this way for 1-2 months  No CP    May 27, 2019: [name] is seen for follow-up of her coronary artery disease, atrial fibrillation.  She also has a history of diabetes mellitus. Forgot her hearing aids today .Marland Kitchen   Very difficult interview  Ate breakfast,  Did not eat lunch  Some weakness,   Some dyspnea   Current Outpatient Medications on File Prior to Visit  Medication Sig Dispense Refill  . acetaminophen (TYLENOL) 500 MG tablet Take 500 mg by mouth every 6 (six) hours as needed for mild pain.    Marland Kitchen ALPRAZolam (XANAX) 1 MG tablet Take 0.5 tablets (0.5 mg total) by mouth 4 (four) times daily as needed for anxiety. 30 tablet 0  . apixaban (ELIQUIS) 5 MG TABS tablet Take 1 tablet (5 mg total) by mouth 2 (two) times daily. 60 tablet 11  . diclofenac sodium (VOLTAREN) 1 % GEL Apply 1 application topically 3 (three) times daily. 1 Tube 1  . diltiazem (CARDIZEM CD) 120 MG 24 hr capsule Take 1 capsule (120 mg total) by mouth daily. 30 capsule 3  . furosemide (LASIX) 20 MG tablet Take 2 tablets (40 mg total) by mouth daily. 60 tablet 3  . glucosamine-chondroitin 500-400 MG tablet Take 1 tablet by mouth daily.     Marland Kitchen glucose blood (ONETOUCH VERIO) test strip USE TO TEST BLOOD SUGAR ONCE DAILY. DX:E11.9 100 each 6  . HYDROcodone-acetaminophen (NORCO/VICODIN) 5-325 MG tablet Take 0.5 tablets by mouth every 6 (six) hours as needed for moderate pain. 30 tablet 0  . loperamide (IMODIUM) 2 MG capsule Take 2 mg by mouth 4 (four) times daily as needed for diarrhea or loose stools.    Marland Kitchen loratadine (CLARITIN) 10 MG tablet Take 10 mg by mouth daily as needed for allergies.    . metoprolol tartrate (LOPRESSOR) 50 MG tablet Take 1 tablet (50 mg total) by mouth 2 (two) times daily. 180 tablet 1  . Multiple Vitamins-Minerals (ICAPS) CAPS Take 1 capsule by mouth daily.    . nitroGLYCERIN (NITROSTAT) 0.4 MG SL tablet Place 1 tablet (0.4 mg total) under the tongue every 5 (five) minutes as needed for chest pain. 25 tablet 3  . nystatin cream  (MYCOSTATIN) Apply 1 application topically 2 (two) times daily. 45 g 0  . OXcarbazepine (TRILEPTAL) 150 MG tablet Take 150 mg by mouth daily. Every evening    . pantoprazole (PROTONIX) 40 MG tablet Take 1 tablet (40 mg total) by mouth 2 (  two) times daily. 60 tablet 3  . pioglitazone (ACTOS) 30 MG tablet TAKE 1 TABLET BY MOUTH EVERY DAY 30 tablet 11  . polyethylene glycol powder (GLYCOLAX/MIRALAX) powder Take 17 g by mouth daily. Hold for diarrhea 3350 g 1  . pravastatin (PRAVACHOL) 20 MG tablet TAKE 1 TABLET BY MOUTH EVERY DAY 90 tablet 1  . Probiotic Product (ALIGN) 4 MG CAPS Take 1 capsule (4 mg total) by mouth daily.    . ramipril (ALTACE) 2.5 MG capsule Take 1 capsule (2.5 mg total) by mouth daily. 30 capsule 3  . traZODone (DESYREL) 150 MG tablet Take 150 mg by mouth at bedtime.     . vitamin B-12 (CYANOCOBALAMIN) 1000 MCG tablet Take 1,000 mcg by mouth daily.    . vitamin E 400 UNIT capsule Take 400 Units by mouth daily.     No current facility-administered medications on file prior to visit.    Allergies  Allergen Reactions  . Metformin And Related Diarrhea    Past Medical History:  Diagnosis Date  . Acute deep vein thrombosis (DVT) of distal vein of right lower extremity ([place]) 12/2013   Acute thrombus of R proximal gastrocnemius vein. Symptomatic provoked distal DVT  . Anxiety   . Cellulitis of leg, left 01/16/2014  . Chronic back pain 03/2012   lumbar DDD with herniation - s/p L S1 transforaminal ESI (10/2012 Dr. Sharlet [place]ina)  . Chronic venous insufficiency   . Coronary artery disease    CABG 1998  . Cystocele   . Depression   . Diabetes mellitus    type 2, followed by endo.  . Diastolic dysfunction   . Diverticulosis   . Hemorrhoids   . History of heart attack   . Hx of echocardiogram    a. Echo 10/13: mild LVH, EF 55-60%, Gr 1 diast dysfn, PASP 32  . Hyperlipidemia   . Hypertension   . Irritable bowel syndrome   . Microcytic anemia   . PAD (peripheral artery disease)  ([place]) 2013   ABI: R 0.91, L 0.83  . Postmenopausal osteoporosis 01/2019   T score of -2.6  . PUD (peptic ulcer disease)   . Varicose veins   . Venous ulcer of leg ([place]) 12/12/2013   Required prolonged treatment by wound center  . Viral gastroenteritis due to [place] virus 05/13/2015   hospitalization    Past Surgical History:  Procedure Laterality Date  . ABDOMINAL HYSTERECTOMY  1975   TAH,BSO  . ABI  2013   R 0.91, L 0.83  . APPENDECTOMY     per pt report  . CARDIOVASCULAR STRESS TEST  2015   low risk myoview ([place])  . CATARACT SURGERY    . CHOLECYSTECTOMY    . COLONOSCOPY  02/2010   extensive diverticulosis throughout, small internal hemorrhoids, rec rpt 5 yrs (Dr. Allyn Kenner)  . CORONARY ARTERY BYPASS GRAFT  1998  . dexa scan  09/2011   femur -0.8, forearm 0.6 => normal  . ENDOVENOUS ABLATION SAPHENOUS VEIN W/ LASER Left 03/2014   Kellie Simmering  . ESI  2014   L S1 transforaminal ESI x2 ([place]) without improvement  . HAND SURGERY    . lexiscan myoview  03/2011   Small basal inferolateral and anterolateral reversible perfusion defect suggests ischemia.  EF was normal.   old infarct, no new ischemia  . LOWER EXTREMITY ANGIOGRAPHY Right 09/06/2018   Procedure: LOWER EXTREMITY ANGIOGRAPHY;  Surgeon: Algernon Huxley, MD;  Location: [facility];  Service: Cardiovascular;  Laterality:  Right;  . toe surgery    . TYMPANOPLASTY  1960s   R side  . Vault prolapse A & P repair  2009   Dr Rhodia Albright [facility] hospital    Social History   Tobacco Use  Smoking Status Former Smoker  . Quit date: 03/05/1979  . Years since quitting: 40.2  Smokeless Tobacco Never Used    Social History   Substance and Sexual Activity  Alcohol Use No  . Alcohol/week: 0.0 standard drinks    Family History  Problem Relation Age of Onset  . Hypertension Mother   . Stroke Mother   . Diabetes Father   . Diabetes Sister   . Diabetes Brother   . Heart disease Son   . Colon cancer Neg Hx     Reviw of  Systems:  Reviewed in the HPI.  All other systems are negative.    Physical Exam: Blood pressure (!) 80/42, pulse 61, height 4\' 9"  (1.448 m), weight 144 lb (65.3 kg), SpO2 97 %.  GEN:  Well nourished, well developed in no acute distress HEENT: Normal NECK: No JVD; No carotid bruits LYMPHATICS: No lymphadenopathy CARDIAC: RRR , no murmurs, rubs, gallops RESPIRATORY:  Clear to auscultation without rales, wheezing or rhonchi  ABDOMEN: Soft, non-tender, non-distended MUSCULOSKELETAL:  No edema; No deformity  SKIN: Warm and dry NEUROLOGIC:  Alert and oriented x 3     ECG:      Assessment / Plan:   1. Coronary artery disease-status post CABG in 1996 - No angina    2. Paroxysmal atrial fib :    She has rare palpitations.  BP is low today and she is very weak.   Will DC dilt and Altc   3. Shortness of breath:     Due to deconditioning   4. Hypertension - BP is actually low now ,  Hold altace and diltiazem    5. Hyperlipidemia  - cont to monitor   6. Right  DVT.  -   7.  Right lower leg wound.  Sees the wound clinic   Mertie Moores, MD  05/27/2019 2:42 PM    [facility] [place],  [place], [place]  [address] Pager [phone] Phone: [phone]; Fax: [phone]

## 2019-06-04 ENCOUNTER — Encounter: Payer: Self-pay | Admitting: Family Medicine

## 2019-06-04 ENCOUNTER — Other Ambulatory Visit: Payer: Self-pay

## 2019-06-04 ENCOUNTER — Ambulatory Visit (INDEPENDENT_AMBULATORY_CARE_PROVIDER_SITE_OTHER): Payer: Medicare Other | Admitting: Family Medicine

## 2019-06-04 VITALS — BP 110/68 | HR 81 | Temp 97.8°F | Ht <= 58 in | Wt 145.1 lb

## 2019-06-04 DIAGNOSIS — I83222 Varicose veins of left lower extremity with both ulcer of calf and inflammation: Secondary | ICD-10-CM | POA: Diagnosis not present

## 2019-06-04 DIAGNOSIS — L97221 Non-pressure chronic ulcer of left calf limited to breakdown of skin: Secondary | ICD-10-CM | POA: Diagnosis not present

## 2019-06-04 DIAGNOSIS — I480 Paroxysmal atrial fibrillation: Secondary | ICD-10-CM

## 2019-06-04 DIAGNOSIS — I25119 Atherosclerotic heart disease of native coronary artery with unspecified angina pectoris: Secondary | ICD-10-CM | POA: Diagnosis not present

## 2019-06-04 MED ORDER — DILTIAZEM HCL ER 120 MG PO CP24: 120.0000 mg | ORAL_CAPSULE | Freq: Every day | ORAL | 6 refills | Status: DC

## 2019-06-04 NOTE — Patient Instructions (Addendum)
Wound is healing well! Ok to leave open. Scab should slowly fall away on its own.  Let us know if worsening again.   Heart is going too fast. Restart diltiazem 120mg  daily. Continue to watch blood pressure and let us know if drops below 100 on top.   HAPPY EARLY BIRTHDAY!

## 2019-06-04 NOTE — Assessment & Plan Note (Signed)
Heart rate too fast today.  Diltiazem and ramipril were recently stopped due to hypotension at cardiology office (BP 80/42).  However now concern for uncontrolled rate - she states home nurse also has been concerned about rapid heart rate.  rec restart diltiazem XR 120mg  daily, monitoring for low heart rates (may need metoprolol dosing adjusted vs consider digoxin). Consider return to cards pending effect of above. Pt agrees with plan.

## 2019-06-04 NOTE — Assessment & Plan Note (Signed)
Actually significant improvement - with residual scab without erythema. May stop dressing changes, just continue compression stocking use.

## 2019-06-04 NOTE — Progress Notes (Signed)
This visit was conducted in person.  BP 110/68 (BP Location: Left Arm, Patient Position: Sitting, Cuff Size: Normal)   Pulse 81 Comment: right hand  Temp 97.8 F (36.6 C) (Temporal)   Ht 4\' 9"  (1.448 m)   Wt 145 lb 1 oz (65.8 kg)   SpO2 98%   BMI 31.39 kg/m   BP Readings from Last 3 Encounters:  06/04/19 110/68  05/27/19 (!) 80/42  05/20/19 112/68    CC:  2 wk wound check Subjective:    [name], female    DOB: 02-24-1928, 86 y.o.   MRN: [id number]  HPI: [name] is a 86 y.o. female presenting on 06/04/2019 for Wound Check (Here for 2 wk f/u.)   Bonnita Nasuti drover her here today. Patient is not driving.   See prior note for details.  Hit L leg on dresser with small wound to L lateral leg. She has been using vaseline gauze twice daily. Feels leg wound is not healing well.   Chronic venous insufficiency with h/o bleeding venous ulcer slow to heal. She has recently been followed by wound clinic for this.   She has previously had good success with vaseline impregnated gauze but ran out of this.   She's currently only taking metoprolol 50mg  bid. Staying fatigued.  Cards recently stopped diltiazem and ramipril.      Relevant past medical, surgical, family and social history reviewed and updated as indicated. Interim medical history since our last visit reviewed. Allergies and medications reviewed and updated. Outpatient Medications Prior to Visit  Medication Sig Dispense Refill  . acetaminophen (TYLENOL) 500 MG tablet Take 500 mg by mouth every 6 (six) hours as needed for mild pain.    Marland Kitchen ALPRAZolam (XANAX) 1 MG tablet Take 0.5 tablets (0.5 mg total) by mouth 4 (four) times daily as needed for anxiety. 30 tablet 0  . apixaban (ELIQUIS) 5 MG TABS tablet Take 1 tablet (5 mg total) by mouth 2 (two) times daily. 60 tablet 11  . diclofenac sodium (VOLTAREN) 1 % GEL Apply 1 application topically 3 (three) times daily. 1 Tube 1  . furosemide (LASIX) 20 MG tablet  Take 2 tablets (40 mg total) by mouth daily. 60 tablet 3  . glucosamine-chondroitin 500-400 MG tablet Take 1 tablet by mouth daily.     Marland Kitchen glucose blood (ONETOUCH VERIO) test strip USE TO TEST BLOOD SUGAR ONCE DAILY. DX:E11.9 100 each 6  . HYDROcodone-acetaminophen (NORCO/VICODIN) 5-325 MG tablet Take 0.5 tablets by mouth every 6 (six) hours as needed for moderate pain. 30 tablet 0  . loperamide (IMODIUM) 2 MG capsule Take 2 mg by mouth 4 (four) times daily as needed for diarrhea or loose stools.    Marland Kitchen loratadine (CLARITIN) 10 MG tablet Take 10 mg by mouth daily as needed for allergies.    . metoprolol tartrate (LOPRESSOR) 50 MG tablet Take 1 tablet (50 mg total) by mouth 2 (two) times daily. 180 tablet 1  . Multiple Vitamins-Minerals (ICAPS) CAPS Take 1 capsule by mouth daily.    . nitroGLYCERIN (NITROSTAT) 0.4 MG SL tablet Place 1 tablet (0.4 mg total) under the tongue every 5 (five) minutes as needed for chest pain. 25 tablet 3  . nystatin cream (MYCOSTATIN) Apply 1 application topically 2 (two) times daily. 45 g 0  . OXcarbazepine (TRILEPTAL) 150 MG tablet Take 150 mg by mouth daily. Every evening    . pantoprazole (PROTONIX) 40 MG tablet Take 1 tablet (40 mg total) by  mouth 2 (two) times daily. 60 tablet 3  . pioglitazone (ACTOS) 30 MG tablet TAKE 1 TABLET BY MOUTH EVERY DAY 30 tablet 11  . polyethylene glycol powder (GLYCOLAX/MIRALAX) powder Take 17 g by mouth daily. Hold for diarrhea 3350 g 1  . pravastatin (PRAVACHOL) 20 MG tablet TAKE 1 TABLET BY MOUTH EVERY DAY 90 tablet 1  . Probiotic Product (ALIGN) 4 MG CAPS Take 1 capsule (4 mg total) by mouth daily.    . traZODone (DESYREL) 150 MG tablet Take 150 mg by mouth at bedtime.     . vitamin B-12 (CYANOCOBALAMIN) 1000 MCG tablet Take 1,000 mcg by mouth daily.    . vitamin E 400 UNIT capsule Take 400 Units by mouth daily.     No facility-administered medications prior to visit.     Per HPI unless specifically indicated in ROS section  below Review of Systems Objective:    BP 110/68 (BP Location: Left Arm, Patient Position: Sitting, Cuff Size: Normal)   Pulse 81 Comment: right hand  Temp 97.8 F (36.6 C) (Temporal)   Ht 4\' 9"  (1.448 m)   Wt 145 lb 1 oz (65.8 kg)   SpO2 98%   BMI 31.39 kg/m   Wt Readings from Last 3 Encounters:  06/04/19 145 lb 1 oz (65.8 kg)  05/27/19 144 lb (65.3 kg)  05/20/19 146 lb 4.8 oz (66.4 kg)    Physical Exam Vitals and nursing note reviewed.  Constitutional:      Appearance: Normal appearance. She is not ill-appearing.     Comments: Walks with walker  Cardiovascular:     Rate and Rhythm: Tachycardia present. Rhythm irregularly irregular.     Comments: Pulse 115 Musculoskeletal:     Right lower leg: No edema.     Left lower leg: No edema.     Comments: Compression stockings in place bilaterally  Skin:    Findings: Wound present.     Comments: L lateral leg wound is healing well - with residual scab without surrounding erythema or open wound  Neurological:     Mental Status: She is alert.  Psychiatric:        Mood and Affect: Mood normal.        Behavior: Behavior normal.       Assessment & Plan:  This visit occurred during the SARS-CoV-2 public health emergency.  Safety protocols were in place, including screening questions prior to the visit, additional usage of staff PPE, and extensive cleaning of exam room while observing appropriate contact time as indicated for disinfecting solutions.  Congratulated on upcoming 89th Birthday Problem List Items Addressed This Visit    Varicose veins of left lower extremity with inflammation, with ulcer of calf limited to breakdown of skin ([place])    Actually significant improvement - with residual scab without erythema. May stop dressing changes, just continue compression stocking use.       Relevant Medications   diltiazem (DILACOR XR) 120 MG 24 hr capsule   Paroxysmal atrial fibrillation (HCC) - Primary    Heart rate too fast today.   Diltiazem and ramipril were recently stopped due to hypotension at cardiology office (BP 80/42).  However now concern for uncontrolled rate - she states home nurse also has been concerned about rapid heart rate.  rec restart diltiazem XR 120mg  daily, monitoring for low heart rates (may need metoprolol dosing adjusted vs consider digoxin). Consider return to cards pending effect of above. Pt agrees with plan.  Relevant Medications   diltiazem (DILACOR XR) 120 MG 24 hr capsule       Meds ordered this encounter  Medications  . diltiazem (DILACOR XR) 120 MG 24 hr capsule    Sig: Take 1 capsule (120 mg total) by mouth daily.    Dispense:  30 capsule    Refill:  6   No orders of the defined types were placed in this encounter.   Patient Instructions  Wound is healing well! Ok to leave open. Scab should slowly fall away on its own.  Let us know if worsening again.   Heart is going too fast. Restart diltiazem 120mg  daily. Continue to watch blood pressure and let us know if drops below 100 on top.   HAPPY EARLY BIRTHDAY!   Follow up plan: Return in about 4 weeks (around 07/02/2019), or if symptoms worsen or fail to improve.  Ria Bush, MD

## 2019-06-10 ENCOUNTER — Other Ambulatory Visit: Payer: Self-pay

## 2019-06-10 DIAGNOSIS — E113393 Type 2 diabetes mellitus with moderate nonproliferative diabetic retinopathy without macular edema, bilateral: Secondary | ICD-10-CM | POA: Diagnosis not present

## 2019-06-11 ENCOUNTER — Encounter: Payer: Self-pay | Admitting: Endocrinology

## 2019-06-11 ENCOUNTER — Ambulatory Visit (INDEPENDENT_AMBULATORY_CARE_PROVIDER_SITE_OTHER): Payer: Medicare Other | Admitting: Endocrinology

## 2019-06-11 ENCOUNTER — Telehealth: Payer: Self-pay | Admitting: Endocrinology

## 2019-06-11 ENCOUNTER — Other Ambulatory Visit: Payer: Self-pay

## 2019-06-11 VITALS — BP 126/72 | HR 117 | Ht <= 58 in | Wt 149.0 lb

## 2019-06-11 DIAGNOSIS — E1165 Type 2 diabetes mellitus with hyperglycemia: Secondary | ICD-10-CM

## 2019-06-11 DIAGNOSIS — I25119 Atherosclerotic heart disease of native coronary artery with unspecified angina pectoris: Secondary | ICD-10-CM | POA: Diagnosis not present

## 2019-06-11 LAB — COMPREHENSIVE METABOLIC PANEL
ALT: 14 U/L (ref 0–35)
AST: 17 U/L (ref 0–37)
Albumin: 3.7 g/dL (ref 3.5–5.2)
Alkaline Phosphatase: 118 U/L — ABNORMAL HIGH (ref 39–117)
BUN: 21 mg/dL (ref 6–23)
CO2: 32 mEq/L (ref 19–32)
Calcium: 9.5 mg/dL (ref 8.4–10.5)
Chloride: 98 mEq/L (ref 96–112)
Creatinine, Ser: 0.98 mg/dL (ref 0.40–1.20)
GFR: 53.31 mL/min — ABNORMAL LOW (ref >60.00)
Glucose, Bld: 177 mg/dL — ABNORMAL HIGH (ref 70–99)
Potassium: 4.3 mEq/L (ref 3.5–5.1)
Sodium: 136 mEq/L (ref 135–145)
Total Bilirubin: 0.9 mg/dL (ref 0.2–1.2)
Total Protein: 7.1 g/dL (ref 6.0–8.3)

## 2019-06-11 LAB — POCT GLYCOSYLATED HEMOGLOBIN (HGB A1C): Hemoglobin A1C: 7.5 % — AB (ref 4.0–5.6)

## 2019-06-11 LAB — GLUCOSE, POCT (MANUAL RESULT ENTRY): POC Glucose: 176 mg/dl — AB (ref 70–99)

## 2019-06-11 MED ORDER — ONETOUCH VERIO VI STRP: ORAL_STRIP | 6 refills | Status: DC

## 2019-06-11 NOTE — Telephone Encounter (Signed)
MEDICATION: One Touch Test Strips  PHARMACY:  [facility] in [place] :   IS PATIENT OUT OF MEDICATION: yes  IF NOT; HOW MUCH IS LEFT:   LAST APPOINTMENT DATE: @3 /12/2019  NEXT APPOINTMENT DATE:@6 /15/2021  DO WE HAVE YOUR PERMISSION TO LEAVE A DETAILED MESSAGE: yes  OTHER COMMENTS:    **Let patient know to contact pharmacy at the end of the day to make sure medication is ready. **  ** Please notify patient to allow 48-72 hours to process**  **Encourage patient to contact the pharmacy for refills or they can request refills through [facility]**

## 2019-06-11 NOTE — Progress Notes (Signed)
[name], female   DOB: 1927/05/16, 86 y.o.   MRN: [id number]   Reason for Appointment: Diabetes follow-up   History of Present Illness   Diagnosis: Type 2 DIABETES MELITUS, date of diagnosis: 1991   She has had mild diabetes for several years and has been on Actos only since about 2001 Her A1c has generally been around or under 7% She was given a trial of metformin because of her difficulty with weight loss and also edema but she felt that it made her blood sugars go up and she will not try it again Also she thinks her blood sugars were higher with a trial of Januvia  RECENT history:  A1c has been variable ranging from 6.1- 6.7 It is now 7.5 compared to 6.6  Oral hypoglycemic drugs: Actos 30 mg daily  Current blood sugar patterns, day-to-day management and problems identified:  Difficult to communicate with the patient because of her hearing deficit   She thinks her blood sugars have been higher, usually checking them in the afternoon as before  Does not bring her meter for download despite repeated reminders  Glucose in the office is 176 today in the afternoon  No recent labs available except A1c done today  Although she appears to be gaining a [place] weight she has not had any edema  She still thinks that she is eating relatively small portions but likely not watching carbohydrates or fat intake  She has been reluctant to take Januvia because of cost of the drug last year         Side effects from medications: None  Monitors blood glucose: ?  Frequency .    Glucometer: One Touch.           Blood Glucose readings at home range from 170-220          Meals: 2  meals per day usually Physical activity:  minimal            Dietician visit: Most recent: Several years ago             Wt Readings from Last 3 Encounters:  06/11/19 149 lb (67.6 kg)  06/04/19 145 lb 1 oz (65.8 kg)  05/27/19 144 lb (65.3 kg)   Lab Results  Component  Value Date   HGBA1C 7.5 (A) 06/11/2019   HGBA1C 6.9 (H) 02/08/2019   HGBA1C 6.6 (A) 01/08/2019   Lab Results  Component Value Date   MICROALBUR <0.7 09/04/2018   LDLCALC 56 02/04/2019   CREATININE 0.98 06/11/2019     Allergies as of 06/11/2019      Reactions   Metformin And Related Diarrhea      Medication List       Accurate as of June 11, 2019 11:59 PM. If you have any questions, ask your nurse or doctor.        acetaminophen 500 MG tablet Commonly known as: TYLENOL Take 500 mg by mouth every 6 (six) hours as needed for mild pain.   Align 4 MG Caps Take 1 capsule (4 mg total) by mouth daily.   ALPRAZolam 1 MG tablet Commonly known as: XANAX Take 0.5 tablets (0.5 mg total) by mouth 4 (four) times daily as needed for anxiety.   apixaban 5 MG Tabs tablet Commonly known as: Eliquis Take 1 tablet (5 mg total) by mouth 2 (two) times daily.   diclofenac sodium 1 % Gel Commonly known as: VOLTAREN Apply 1 application topically 3 (three)  times daily.   diltiazem 120 MG 24 hr capsule Commonly known as: DILACOR XR Take 1 capsule (120 mg total) by mouth daily.   furosemide 20 MG tablet Commonly known as: LASIX Take 2 tablets (40 mg total) by mouth daily.   glucosamine-chondroitin 500-400 MG tablet Take 1 tablet by mouth daily.   HYDROcodone-acetaminophen 5-325 MG tablet Commonly known as: NORCO/VICODIN Take 0.5 tablets by mouth every 6 (six) hours as needed for moderate pain.   ICaps Caps Take 1 capsule by mouth daily.   loperamide 2 MG capsule Commonly known as: IMODIUM Take 2 mg by mouth 4 (four) times daily as needed for diarrhea or loose stools.   loratadine 10 MG tablet Commonly known as: CLARITIN Take 10 mg by mouth daily as needed for allergies.   metoprolol tartrate 50 MG tablet Commonly known as: LOPRESSOR Take 1 tablet (50 mg total) by mouth 2 (two) times daily.   nitroGLYCERIN 0.4 MG SL tablet Commonly known as: NITROSTAT Place 1 tablet (0.4  mg total) under the tongue every 5 (five) minutes as needed for chest pain.   nystatin cream Commonly known as: MYCOSTATIN Apply 1 application topically 2 (two) times daily.   OneTouch Verio test strip Generic drug: glucose blood USE TO TEST BLOOD SUGAR ONCE DAILY. DX:E11.9   OXcarbazepine 150 MG tablet Commonly known as: TRILEPTAL Take 150 mg by mouth daily. Every evening   pantoprazole 40 MG tablet Commonly known as: PROTONIX Take 1 tablet (40 mg total) by mouth 2 (two) times daily.   pioglitazone 30 MG tablet Commonly known as: ACTOS TAKE 1 TABLET BY MOUTH EVERY DAY   polyethylene glycol powder 17 GM/SCOOP powder Commonly known as: GLYCOLAX/MIRALAX Take 17 g by mouth daily. Hold for diarrhea   pravastatin 20 MG tablet Commonly known as: PRAVACHOL TAKE 1 TABLET BY MOUTH EVERY DAY   traZODone 150 MG tablet Commonly known as: DESYREL Take 150 mg by mouth at bedtime.   vitamin B-12 1000 MCG tablet Commonly known as: CYANOCOBALAMIN Take 1,000 mcg by mouth daily.   vitamin E 180 MG (400 UNITS) capsule Take 400 Units by mouth daily.       Allergies:  Allergies  Allergen Reactions  . Metformin And Related Diarrhea    Past Medical History:  Diagnosis Date  . Acute deep vein thrombosis (DVT) of distal vein of right lower extremity ([place]) 12/2013   Acute thrombus of R proximal gastrocnemius vein. Symptomatic provoked distal DVT  . Anxiety   . Cellulitis of leg, left 01/16/2014  . Chronic back pain 03/2012   lumbar DDD with herniation - s/p L S1 transforaminal ESI (10/2012 Dr. Sharlet Salina)  . Chronic venous insufficiency   . Coronary artery disease    CABG 1998  . Cystocele   . Depression   . Diabetes mellitus    type 2, followed by endo.  . Diastolic dysfunction   . Diverticulosis   . Hemorrhoids   . History of heart attack   . Hx of echocardiogram    a. Echo 10/13: mild LVH, EF 55-60%, Gr 1 diast dysfn, PASP 32  . Hyperlipidemia   . Hypertension   .  Irritable bowel syndrome   . Microcytic anemia   . PAD (peripheral artery disease) ([place]) 2013   ABI: R 0.91, L 0.83  . Postmenopausal osteoporosis 01/2019   T score of -2.6  . PUD (peptic ulcer disease)   . Varicose veins   . Venous ulcer of leg ([place]) 12/12/2013   Required  prolonged treatment by wound center  . Viral gastroenteritis due to [place] virus 05/13/2015   hospitalization    Past Surgical History:  Procedure Laterality Date  . ABDOMINAL HYSTERECTOMY  1975   TAH,BSO  . ABI  2013   R 0.91, L 0.83  . APPENDECTOMY     per pt report  . CARDIOVASCULAR STRESS TEST  2015   low risk myoview ([place])  . CATARACT SURGERY    . CHOLECYSTECTOMY    . COLONOSCOPY  02/2010   extensive diverticulosis throughout, small internal hemorrhoids, rec rpt 5 yrs (Dr. Allyn Kenner)  . CORONARY ARTERY BYPASS GRAFT  1998  . dexa scan  09/2011   femur -0.8, forearm 0.6 => normal  . ENDOVENOUS ABLATION SAPHENOUS VEIN W/ LASER Left 03/2014   Kellie Simmering  . ESI  2014   L S1 transforaminal ESI x2 ([place]) without improvement  . HAND SURGERY    . lexiscan myoview  03/2011   Small basal inferolateral and anterolateral reversible perfusion defect suggests ischemia.  EF was normal.   old infarct, no new ischemia  . LOWER EXTREMITY ANGIOGRAPHY Right 09/06/2018   Procedure: LOWER EXTREMITY ANGIOGRAPHY;  Surgeon: Algernon Huxley, MD;  Location: [place] [facility];  Service: Cardiovascular;  Laterality: Right;  . toe surgery    . TYMPANOPLASTY  1960s   R side  . Vault prolapse A & P repair  2009   Dr Rhodia Albright [facility]    Family History  Problem Relation Age of Onset  . Hypertension Mother   . Stroke Mother   . Diabetes Father   . Diabetes Sister   . Diabetes Brother   . Heart disease Son   . Colon cancer Neg Hx     Social History:  reports that she quit smoking about 40 years ago. She has never used smokeless tobacco. She reports that she does not drink alcohol or use drugs.  Review of  Systems:  She again complains of feeling bad and worse than usual Has not discussed with PCP who saw her for follow-up of atrial fibrillation earlier this month  Lab Results  Component Value Date   TSH 2.63 09/04/2018   TSH 2.41 10/26/2016   TSH 2.71 12/30/2015   [name]4 0.91 10/26/2016     HYPERTENSION:  well-controlled on Ramipril 5 mg   BP Readings from Last 3 Encounters:  06/11/19 126/72  06/04/19 110/68  05/27/19 (!) 80/42    HYPERLIPIDEMIA: The lipid abnormality consists of elevated LDL treated with pravastatin This is followed by cardiologist and PCP Last LDL below 70  Lab Results  Component Value Date   CHOL 115 02/04/2019   HDL 47.20 02/04/2019   LDLCALC 56 02/04/2019   TRIG 60.0 02/04/2019   CHOLHDL 2 02/04/2019    Last foot exam in 1/20      Examination:   BP 126/72 (BP Location: Left Arm, Patient Position: Sitting, Cuff Size: Normal)   Pulse (!) 117   Ht 4\' 9"  (1.448 m)   Wt 149 lb (67.6 kg)   SpO2 95%   BMI 32.24 kg/m   Body mass index is 32.24 kg/m.   No ankle edema present  ASSESSMENT/ PLAN:   DIABETES type 2 with obesity  A1c is 7.5 and relatively higher  She is currently on Actos alone  Although her diabetes control may be acceptable for her age she appears to be showing some progression Previously had not done well with Metformin with lack of improvement and she did not  want to continue it Discussed that we can try her on Januvia again and if she can take 1/2 tablet daily this would be affordable Reminded her to bring her meter for download on next visit  Follow-up in 3 months  Follow-up with PCP for symptoms of general malaise and various other problems including insomnia   Patient Instructions  Check blood sugars on waking up 3 days a week  Also check blood sugars about 2 hours after meals and do this after different meals by rotation  Recommended blood sugar levels on waking up are 90-130 and about 2 hours after meal is  130-200  Please bring your blood sugar monitor to each visit, thank you  Start JANUVIA 1/2 pill daily in addition to Actos     Elayne Snare 06/12/2019, 8:37 PM      Note: This office note was prepared with Dragon voice recognition system technology. Any transcriptional errors that result from this process are unintentional.

## 2019-06-11 NOTE — Patient Instructions (Signed)
Check blood sugars on waking up 3 days a week  Also check blood sugars about 2 hours after meals and do this after different meals by rotation  Recommended blood sugar levels on waking up are 90-130 and about 2 hours after meal is 130-200  Please bring your blood sugar monitor to each visit, thank you  Start JANUVIA 1/2 pill daily in addition to Actos

## 2019-06-11 NOTE — Telephone Encounter (Signed)
Rx sent 

## 2019-06-12 ENCOUNTER — Other Ambulatory Visit: Payer: Self-pay | Admitting: Endocrinology

## 2019-06-12 DIAGNOSIS — Z23 Encounter for immunization: Secondary | ICD-10-CM | POA: Diagnosis not present

## 2019-06-13 ENCOUNTER — Other Ambulatory Visit: Payer: Self-pay | Admitting: Family Medicine

## 2019-06-17 ENCOUNTER — Telehealth: Payer: Self-pay | Admitting: Family Medicine

## 2019-06-17 NOTE — Telephone Encounter (Signed)
Patient's coming for an appointment tomorrow.  Patient's friend, [name], called.  She's concerned because patient had the [place] vaccine last Wednesday.  [name] said patient has felt worse since she had the vaccine.  [name] said she tried to take patient to the ER, but she refused.  [name]'s concerned about people being exposed to her. [name] doesn't want to be around her. [name] will be the one bringing her to the appointment.

## 2019-06-18 ENCOUNTER — Encounter: Payer: Self-pay | Admitting: Family Medicine

## 2019-06-18 ENCOUNTER — Ambulatory Visit (INDEPENDENT_AMBULATORY_CARE_PROVIDER_SITE_OTHER): Payer: Medicare Other | Admitting: Family Medicine

## 2019-06-18 ENCOUNTER — Other Ambulatory Visit: Payer: Self-pay

## 2019-06-18 ENCOUNTER — Other Ambulatory Visit: Payer: Self-pay | Admitting: Family Medicine

## 2019-06-18 VITALS — BP 128/74 | HR 115 | Temp 97.6°F | Ht <= 58 in | Wt 147.3 lb

## 2019-06-18 DIAGNOSIS — I25119 Atherosclerotic heart disease of native coronary artery with unspecified angina pectoris: Secondary | ICD-10-CM | POA: Diagnosis not present

## 2019-06-18 DIAGNOSIS — I480 Paroxysmal atrial fibrillation: Secondary | ICD-10-CM

## 2019-06-18 NOTE — Telephone Encounter (Signed)
Pt here for OV today.

## 2019-06-18 NOTE — Assessment & Plan Note (Signed)
Ongoing malaise, fatigue, not feeling well - similar to when she was here 2 weeks ago. She was tachycardic at that time and again today. She did not start diltiazem as advised. Will start now and monitor BP, HR and symptoms. Check CBC, TSH today. Keep f/u appt already scheduled for next week.

## 2019-06-18 NOTE — Progress Notes (Signed)
This visit was conducted in person.  BP 128/74 (BP Location: Left Arm, Patient Position: Sitting, Cuff Size: Normal)   Pulse (!) 115   Temp 97.6 F (36.4 C) (Temporal)   Ht 4\' 9"  (1.448 m)   Wt 147 lb 5 oz (66.8 kg)   SpO2 98%   BMI 31.88 kg/m    CC: HA Subjective:    [name], female    DOB: October 22, 1927, 86 y.o.   MRN: [id number]  HPI: [name] is a 86 y.o. female presenting on 06/18/2019 for Fatigue (Feels weak and tired all the time and general malaise.  Feels sleep all day.  Started 06/14/19.) and Leg Swelling (C/o right lower leg swelling and pain. )   Received J&J Janssen vaccine 06/11/2019.   Not feeling well since 06/14/2019 - trouble staying asleep in bed, does better in recliner. Doesn't endorse orthopnea however. No leg swelling, chest pain. Denies nausea. Some constipation currently - treating with metamucil. She is on hydrocodone. Chronic dyspnea unchanged.   Last visit due to recurrent atrial fibrillation and tachycardia I asked her to restart diltiazem ER 120mg  daily - she did not restart this.   She is taking metoprolol 50mg  bid and furosemide 40mg  daily.      Relevant past medical, surgical, family and social history reviewed and updated as indicated. Interim medical history since our last visit reviewed. Allergies and medications reviewed and updated. Outpatient Medications Prior to Visit  Medication Sig Dispense Refill  . acetaminophen (TYLENOL) 500 MG tablet Take 500 mg by mouth every 6 (six) hours as needed for mild pain.    Marland Kitchen ALPRAZolam (XANAX) 1 MG tablet Take 0.5 tablets (0.5 mg total) by mouth 4 (four) times daily as needed for anxiety. 30 tablet 0  . apixaban (ELIQUIS) 5 MG TABS tablet Take 1 tablet (5 mg total) by mouth 2 (two) times daily. 60 tablet 11  . diclofenac sodium (VOLTAREN) 1 % GEL Apply 1 application topically 3 (three) times daily. 1 Tube 1  . diltiazem (DILACOR XR) 120 MG 24 hr capsule Take 1 capsule (120 mg total)  by mouth daily. 30 capsule 6  . furosemide (LASIX) 20 MG tablet Take 2 tablets (40 mg total) by mouth daily. 60 tablet 3  . glucosamine-chondroitin 500-400 MG tablet Take 1 tablet by mouth daily.     Marland Kitchen glucose blood (ONETOUCH VERIO) test strip USE TO TEST BLOOD SUGAR ONCE DAILY. DX:E11.9 100 each 6  . HYDROcodone-acetaminophen (NORCO/VICODIN) 5-325 MG tablet Take 0.5 tablets by mouth every 6 (six) hours as needed for moderate pain. 30 tablet 0  . loperamide (IMODIUM) 2 MG capsule Take 2 mg by mouth 4 (four) times daily as needed for diarrhea or loose stools.    Marland Kitchen loratadine (CLARITIN) 10 MG tablet Take 10 mg by mouth daily as needed for allergies.    . metoprolol tartrate (LOPRESSOR) 50 MG tablet Take 1 tablet (50 mg total) by mouth 2 (two) times daily. 180 tablet 1  . Multiple Vitamins-Minerals (ICAPS) CAPS Take 1 capsule by mouth daily.    . nitroGLYCERIN (NITROSTAT) 0.4 MG SL tablet Place 1 tablet (0.4 mg total) under the tongue every 5 (five) minutes as needed for chest pain. 25 tablet 3  . nystatin cream (MYCOSTATIN) Apply 1 application topically 2 (two) times daily. 45 g 0  . OXcarbazepine (TRILEPTAL) 150 MG tablet Take 150 mg by mouth daily. Every evening    . pantoprazole (PROTONIX) 40 MG tablet Take 1  tablet (40 mg total) by mouth 2 (two) times daily. 60 tablet 3  . pioglitazone (ACTOS) 30 MG tablet TAKE 1 TABLET BY MOUTH EVERY DAY 30 tablet 11  . polyethylene glycol powder (GLYCOLAX/MIRALAX) powder Take 17 g by mouth daily. Hold for diarrhea 3350 g 1  . pravastatin (PRAVACHOL) 20 MG tablet TAKE 1 TABLET BY MOUTH EVERY DAY 90 tablet 1  . Probiotic Product (ALIGN) 4 MG CAPS Take 1 capsule (4 mg total) by mouth daily.    . sitaGLIPtin (JANUVIA) 100 MG tablet Take 1/2 tablet by mouth once daily. 30 tablet 2  . traZODone (DESYREL) 150 MG tablet Take 150 mg by mouth at bedtime.     . vitamin B-12 (CYANOCOBALAMIN) 1000 MCG tablet Take 1,000 mcg by mouth daily.    . vitamin E 400 UNIT capsule  Take 400 Units by mouth daily.     No facility-administered medications prior to visit.     Per HPI unless specifically indicated in ROS section below Review of Systems Objective:    BP 128/74 (BP Location: Left Arm, Patient Position: Sitting, Cuff Size: Normal)   Pulse (!) 115   Temp 97.6 F (36.4 C) (Temporal)   Ht 4\' 9"  (1.448 m)   Wt 147 lb 5 oz (66.8 kg)   SpO2 98%   BMI 31.88 kg/m   Wt Readings from Last 3 Encounters:  06/18/19 147 lb 5 oz (66.8 kg)  06/11/19 149 lb (67.6 kg)  06/04/19 145 lb 1 oz (65.8 kg)    Physical Exam Vitals and nursing note reviewed.  Constitutional:      Appearance: Normal appearance. She is not ill-appearing.  Cardiovascular:     Rate and Rhythm: Regular rhythm. Tachycardia present.     Pulses: Normal pulses.     Heart sounds: Normal heart sounds. No murmur.  Pulmonary:     Effort: Pulmonary effort is normal. No respiratory distress.     Breath sounds: Normal breath sounds. No wheezing, rhonchi or rales.  Musculoskeletal:        General: Tenderness present.     Right lower leg: Edema (tr) present.     Left lower leg: Edema (tr) present.     Comments:  Compression socks in place. Chronically tender legs  Neurological:     Mental Status: She is alert.       Results for orders placed or performed in visit on 06/11/19  Comprehensive metabolic panel  Result Value Ref Range   Sodium 136 135 - 145 mEq/L   Potassium 4.3 3.5 - 5.1 mEq/L   Chloride 98 96 - 112 mEq/L   CO2 32 19 - 32 mEq/L   Glucose, Bld 177 (H) 70 - 99 mg/dL   BUN 21 6 - 23 mg/dL   Creatinine, Ser 0.98 0.40 - 1.20 mg/dL   Total Bilirubin 0.9 0.2 - 1.2 mg/dL   Alkaline Phosphatase 118 (H) 39 - 117 U/L   AST 17 0 - 37 U/L   ALT 14 0 - 35 U/L   Total Protein 7.1 6.0 - 8.3 g/dL   Albumin 3.7 3.5 - 5.2 g/dL   GFR 53.31 (L) >60.00 mL/min   Calcium 9.5 8.4 - 10.5 mg/dL  POCT Glucose (CBG)  Result Value Ref Range   POC Glucose 176 (A) 70 - 99 mg/dl  POCT HgB A1C    Result Value Ref Range   Hemoglobin A1C 7.5 (A) 4.0 - 5.6 %   HbA1c POC (<> result, manual entry)  HbA1c, POC (prediabetic range)     HbA1c, POC (controlled diabetic range)     *Note: Due to a large number of results and/or encounters for the requested time period, some results have not been displayed. A complete set of results can be found in Results Review.   Assessment & Plan:  This visit occurred during the SARS-CoV-2 public health emergency.  Safety protocols were in place, including screening questions prior to the visit, additional usage of staff PPE, and extensive cleaning of exam room while observing appropriate contact time as indicated for disinfecting solutions.   Problem List Items Addressed This Visit    Paroxysmal atrial fibrillation ([place]) - Primary    Ongoing malaise, fatigue, not feeling well - similar to when she was here 2 weeks ago. She was tachycardic at that time and again today. She did not start diltiazem as advised. Will start now and monitor BP, HR and symptoms. Check CBC, TSH today. Keep f/u appt already scheduled for next week.       Relevant Orders   TSH   CBC with Differential/Platelet       No orders of the defined types were placed in this encounter.  Orders Placed This Encounter  Procedures  . TSH  . CBC with Differential/Platelet    Patient Instructions  Heart is racing. Increase water. Restart diltiazem 120mg  once daily.  Keep appointment next month.  Labs today.    Follow up plan: Return if symptoms worsen or fail to improve.  Ria Bush, MD

## 2019-06-18 NOTE — Telephone Encounter (Addendum)
Attempted to contact pt.  No answer.  Vm not set up yet.  Need more details on pt's sxs before 2:00 OV today.  May need to schedule virtual visit.   Spoke with pt's friend, [name] (accompanied pt to many OVs), asking for more details on pt's sxs.  Says she is not sure.  Pt would not elaborate past, "I just don't feel good."

## 2019-06-18 NOTE — Patient Instructions (Addendum)
Heart is racing. Increase water. Restart diltiazem 120mg  once daily.  Keep appointment next month.  Labs today.

## 2019-06-19 ENCOUNTER — Telehealth: Payer: Self-pay | Admitting: Family Medicine

## 2019-06-19 LAB — CBC WITH DIFFERENTIAL/PLATELET
Basophils Absolute: 0.1 10*3/uL (ref 0.0–0.1)
Basophils Relative: 1.1 % (ref 0.0–3.0)
Eosinophils Absolute: 0.1 10*3/uL (ref 0.0–0.7)
Eosinophils Relative: 1.5 % (ref 0.0–5.0)
HCT: 36.1 % (ref 36.0–46.0)
Hemoglobin: 12 g/dL (ref 12.0–15.0)
Lymphocytes Relative: 11 % — ABNORMAL LOW (ref 12.0–46.0)
Lymphs Abs: 0.9 10*3/uL (ref 0.7–4.0)
MCHC: 33.3 g/dL (ref 30.0–36.0)
MCV: 92.9 fl (ref 78.0–100.0)
Monocytes Absolute: 0.6 10*3/uL (ref 0.1–1.0)
Monocytes Relative: 7.2 % (ref 3.0–12.0)
Neutro Abs: 6.4 10*3/uL (ref 1.4–7.7)
Neutrophils Relative %: 79.2 % — ABNORMAL HIGH (ref 43.0–77.0)
Platelets: 274 10*3/uL (ref 150.0–400.0)
RBC: 3.89 Mil/uL (ref 3.87–5.11)
RDW: 14.2 % (ref 11.5–15.5)
WBC: 8.1 10*3/uL (ref 4.0–10.5)

## 2019-06-19 LAB — TSH: TSH: 2.93 u[IU]/mL (ref 0.35–4.50)

## 2019-06-19 NOTE — Telephone Encounter (Signed)
Patient called today She stated that she is very confused about a medication she has been prescribed. She stated Dr Darnell Level sent in the diltiazem but when she spoke with the pharmacy they stated she has already picked that medication up from them. She stated that the medication name on the bottle is not the same and what she was prescribed   Please advise

## 2019-06-19 NOTE — Telephone Encounter (Signed)
Spoke with pt.  Had question about diltiazem asking if she could take med at night because she cannot sleep when taking med in AM.   Per Dr. Darnell Level, ok for pt to take at bedtime.  Relayed info to pt.  Verbalizes understanding.

## 2019-06-25 ENCOUNTER — Encounter: Payer: Self-pay | Admitting: Family Medicine

## 2019-06-25 ENCOUNTER — Ambulatory Visit (INDEPENDENT_AMBULATORY_CARE_PROVIDER_SITE_OTHER): Payer: Medicare Other | Admitting: Family Medicine

## 2019-06-25 ENCOUNTER — Other Ambulatory Visit: Payer: Self-pay

## 2019-06-25 VITALS — BP 120/66 | HR 117 | Temp 97.6°F | Ht <= 58 in | Wt 147.1 lb

## 2019-06-25 DIAGNOSIS — I480 Paroxysmal atrial fibrillation: Secondary | ICD-10-CM

## 2019-06-25 DIAGNOSIS — I25119 Atherosclerotic heart disease of native coronary artery with unspecified angina pectoris: Secondary | ICD-10-CM

## 2019-06-25 DIAGNOSIS — I872 Venous insufficiency (chronic) (peripheral): Secondary | ICD-10-CM

## 2019-06-25 NOTE — Assessment & Plan Note (Addendum)
Persistent tachycardia - will increase metoprolol to 75mg  BID (1.5 tab BID). Continue diltiazem XR 120mg  daily. RTC 2 wks f/u visit. BP should tolerate this

## 2019-06-25 NOTE — Progress Notes (Signed)
This visit was conducted in person.  BP 120/66 (BP Location: Left Arm, Patient Position: Sitting, Cuff Size: Normal)   Pulse (!) 117   Temp 97.6 F (36.4 C) (Temporal)   Ht 4\' 9"  (1.448 m)   Wt 147 lb 2 oz (66.7 kg)   SpO2 95%   BMI 31.84 kg/m    CC: 3 wk f/u visit  Subjective:    [name], female    DOB: 1928-01-04, 86 y.o.   MRN: [id number]  HPI: [name] is a 86 y.o. female presenting on 06/25/2019 for Follow-up (Here for 3-4 wk f/u.)   See prior notes for details.  "I've got something wrong with my legs" - increasing swelling of both legs. This is despite religiously using her knee high compression socks.   She states she's now taking metoprolol 50mg  bid, lasix 40mg  daily, and diltiazem ER (dilacor XR) 120mg  daily at night. She states she's taking white metoprolol tablets (which are 25mg ) but she should be taking 50mg  tablets (which should be pink). I called pharmacy - she last filled metoprolol 50mg  BID #180 on 05/20/2019 - and they state that these are white.   Ongoing tachycardia. Ongoing dyspnea, HA, dizziness. No chest pain.   She saw home nurse 2 weeks ago with med rec performed at that time. Ongoing confusion over meds.      Relevant past medical, surgical, family and social history reviewed and updated as indicated. Interim medical history since our last visit reviewed. Allergies and medications reviewed and updated. Outpatient Medications Prior to Visit  Medication Sig Dispense Refill  . acetaminophen (TYLENOL) 500 MG tablet Take 500 mg by mouth every 6 (six) hours as needed for mild pain.    Marland Kitchen ALPRAZolam (XANAX) 1 MG tablet Take 0.5 tablets (0.5 mg total) by mouth 4 (four) times daily as needed for anxiety. 30 tablet 0  . apixaban (ELIQUIS) 5 MG TABS tablet Take 1 tablet (5 mg total) by mouth 2 (two) times daily. 60 tablet 11  . diclofenac sodium (VOLTAREN) 1 % GEL Apply 1 application topically 3 (three) times daily. 1 Tube 1  .  diltiazem (DILACOR XR) 120 MG 24 hr capsule Take 1 capsule (120 mg total) by mouth daily. 30 capsule 6  . furosemide (LASIX) 20 MG tablet Take 2 tablets (40 mg total) by mouth daily. 60 tablet 3  . glucosamine-chondroitin 500-400 MG tablet Take 1 tablet by mouth daily.     Marland Kitchen glucose blood (ONETOUCH VERIO) test strip USE TO TEST BLOOD SUGAR ONCE DAILY. DX:E11.9 100 each 6  . HYDROcodone-acetaminophen (NORCO/VICODIN) 5-325 MG tablet Take 0.5 tablets by mouth every 6 (six) hours as needed for moderate pain. 30 tablet 0  . loperamide (IMODIUM) 2 MG capsule Take 2 mg by mouth 4 (four) times daily as needed for diarrhea or loose stools.    Marland Kitchen loratadine (CLARITIN) 10 MG tablet Take 10 mg by mouth daily as needed for allergies.    . metoprolol tartrate (LOPRESSOR) 50 MG tablet Take 1.5 tablets (75 mg total) by mouth 2 (two) times daily.    . Multiple Vitamins-Minerals (ICAPS) CAPS Take 1 capsule by mouth daily.    . nitroGLYCERIN (NITROSTAT) 0.4 MG SL tablet Place 1 tablet (0.4 mg total) under the tongue every 5 (five) minutes as needed for chest pain. 25 tablet 3  . nystatin cream (MYCOSTATIN) Apply 1 application topically 2 (two) times daily. 45 g 0  . OXcarbazepine (TRILEPTAL) 150 MG tablet Take  150 mg by mouth daily. Every evening    . pantoprazole (PROTONIX) 40 MG tablet Take 1 tablet (40 mg total) by mouth 2 (two) times daily. 60 tablet 3  . pioglitazone (ACTOS) 30 MG tablet TAKE 1 TABLET BY MOUTH EVERY DAY 30 tablet 11  . polyethylene glycol powder (GLYCOLAX/MIRALAX) powder Take 17 g by mouth daily. Hold for diarrhea 3350 g 1  . pravastatin (PRAVACHOL) 20 MG tablet TAKE 1 TABLET BY MOUTH EVERY DAY 90 tablet 1  . Probiotic Product (ALIGN) 4 MG CAPS Take 1 capsule (4 mg total) by mouth daily.    . sitaGLIPtin (JANUVIA) 100 MG tablet Take 1/2 tablet by mouth once daily. 30 tablet 2  . traZODone (DESYREL) 150 MG tablet Take 150 mg by mouth at bedtime.     . vitamin B-12 (CYANOCOBALAMIN) 1000 MCG tablet  Take 1,000 mcg by mouth daily.    . vitamin E 400 UNIT capsule Take 400 Units by mouth daily.    . metoprolol tartrate (LOPRESSOR) 50 MG tablet Take 1 tablet (50 mg total) by mouth 2 (two) times daily. 180 tablet 1   No facility-administered medications prior to visit.     Per HPI unless specifically indicated in ROS section below Review of Systems Objective:    BP 120/66 (BP Location: Left Arm, Patient Position: Sitting, Cuff Size: Normal)   Pulse (!) 117   Temp 97.6 F (36.4 C) (Temporal)   Ht 4\' 9"  (1.448 m)   Wt 147 lb 2 oz (66.7 kg)   SpO2 95%   BMI 31.84 kg/m   Wt Readings from Last 3 Encounters:  06/25/19 147 lb 2 oz (66.7 kg)  06/18/19 147 lb 5 oz (66.8 kg)  06/11/19 149 lb (67.6 kg)    Physical Exam Vitals and nursing note reviewed.  Constitutional:      Appearance: Normal appearance. She is not ill-appearing.     Comments: Walks with walker  Cardiovascular:     Rate and Rhythm: Regular rhythm. Tachycardia present.     Pulses: Normal pulses.     Heart sounds: Normal heart sounds. No murmur.  Pulmonary:     Effort: Pulmonary effort is normal. No respiratory distress.     Breath sounds: Normal breath sounds. No wheezing, rhonchi or rales.  Musculoskeletal:     Right lower leg: Edema (1+) present.     Left lower leg: Edema (tr) present.  Neurological:     Mental Status: She is alert.  Psychiatric:        Mood and Affect: Mood normal.        Behavior: Behavior normal.       Results for orders placed or performed in visit on 06/18/19  TSH  Result Value Ref Range   TSH 2.93 0.35 - 4.50 uIU/mL  CBC with Differential/Platelet  Result Value Ref Range   WBC 8.1 4.0 - 10.5 K/uL   RBC 3.89 3.87 - 5.11 Mil/uL   Hemoglobin 12.0 12.0 - 15.0 g/dL   HCT 36.1 36.0 - 46.0 %   MCV 92.9 78.0 - 100.0 fl   MCHC 33.3 30.0 - 36.0 g/dL   RDW 14.2 11.5 - 15.5 %   Platelets 274.0 150.0 - 400.0 K/uL   Neutrophils Relative % 79.2 (H) 43.0 - 77.0 %   Lymphocytes Relative 11.0  (L) 12.0 - 46.0 %   Monocytes Relative 7.2 3.0 - 12.0 %   Eosinophils Relative 1.5 0.0 - 5.0 %   Basophils Relative 1.1 0.0 - 3.0 %  Neutro Abs 6.4 1.4 - 7.7 K/uL   Lymphs Abs 0.9 0.7 - 4.0 K/uL   Monocytes Absolute 0.6 0.1 - 1.0 K/uL   Eosinophils Absolute 0.1 0.0 - 0.7 K/uL   Basophils Absolute 0.1 0.0 - 0.1 K/uL   *Note: Due to a large number of results and/or encounters for the requested time period, some results have not been displayed. A complete set of results can be found in Results Review.   Assessment & Plan:  This visit occurred during the SARS-CoV-2 public health emergency.  Safety protocols were in place, including screening questions prior to the visit, additional usage of staff PPE, and extensive cleaning of exam room while observing appropriate contact time as indicated for disinfecting solutions.  Verified with pharmacist latest metoprolol refilled as well as color of tablet.  Problem List Items Addressed This Visit    Paroxysmal atrial fibrillation ([place]) - Primary    Persistent tachycardia - will increase metoprolol to 75mg  BID (1.5 tab BID). Continue diltiazem XR 120mg  daily. RTC 2 wks f/u visit. BP should tolerate this       Relevant Medications   metoprolol tartrate (LOPRESSOR) 50 MG tablet   Chronic venous insufficiency    Worsening pedal edema - anticipate related to persistent tachycardia.       Relevant Medications   metoprolol tartrate (LOPRESSOR) 50 MG tablet       No orders of the defined types were placed in this encounter.  No orders of the defined types were placed in this encounter.  Patient Instructions  Heart rate is still going too fast despite starting diltiazem (dilacor XR).  I want to increase metoprolol. Bring all your medicines to every visit  Increase metoprolol to 1.5 tablets (75mg ) twice daily - 1.5 tablets in the morning and 1.5 tablets at night. Schedule appointment with me in 2 weeks for follow up.    Follow up plan: Return in  about 2 weeks (around 07/09/2019).  Ria Bush, MD

## 2019-06-25 NOTE — Patient Instructions (Addendum)
Heart rate is still going too fast despite starting diltiazem (dilacor XR).  I want to increase metoprolol. Bring all your medicines to every visit  Increase metoprolol to 1.5 tablets (75mg ) twice daily - 1.5 tablets in the morning and 1.5 tablets at night. Schedule appointment with me in 2 weeks for follow up.

## 2019-06-25 NOTE — Assessment & Plan Note (Signed)
Worsening pedal edema - anticipate related to persistent tachycardia.

## 2019-06-27 ENCOUNTER — Other Ambulatory Visit: Payer: Self-pay | Admitting: Family Medicine

## 2019-06-28 ENCOUNTER — Other Ambulatory Visit: Payer: Self-pay | Admitting: Family Medicine

## 2019-07-08 ENCOUNTER — Encounter: Payer: Self-pay | Admitting: Family Medicine

## 2019-07-08 ENCOUNTER — Other Ambulatory Visit: Payer: Self-pay

## 2019-07-08 ENCOUNTER — Ambulatory Visit (INDEPENDENT_AMBULATORY_CARE_PROVIDER_SITE_OTHER): Payer: Medicare Other | Admitting: Family Medicine

## 2019-07-08 VITALS — BP 118/68 | HR 77 | Temp 97.6°F | Ht <= 58 in | Wt 143.1 lb

## 2019-07-08 DIAGNOSIS — I25119 Atherosclerotic heart disease of native coronary artery with unspecified angina pectoris: Secondary | ICD-10-CM | POA: Diagnosis not present

## 2019-07-08 DIAGNOSIS — I10 Essential (primary) hypertension: Secondary | ICD-10-CM

## 2019-07-08 DIAGNOSIS — I83212 Varicose veins of right lower extremity with both ulcer of calf and inflammation: Secondary | ICD-10-CM

## 2019-07-08 DIAGNOSIS — I872 Venous insufficiency (chronic) (peripheral): Secondary | ICD-10-CM | POA: Diagnosis not present

## 2019-07-08 DIAGNOSIS — L97211 Non-pressure chronic ulcer of right calf limited to breakdown of skin: Secondary | ICD-10-CM

## 2019-07-08 NOTE — Assessment & Plan Note (Addendum)
Ongoing discomfort despite compression socks. Pedal edema is stable.

## 2019-07-08 NOTE — Patient Instructions (Signed)
You are doing better today! Heart is sounding regular.  You have healing wound on right lateral leg - continue vaseline daily.  Return in 1 month for follow up visit.

## 2019-07-08 NOTE — Progress Notes (Signed)
This visit was conducted in person.  BP 118/68 (BP Location: Left Arm, Patient Position: Sitting, Cuff Size: Normal)   Pulse 77   Temp 97.6 F (36.4 C) (Temporal)   Ht 4\' 9"  (1.448 m)   Wt 143 lb 1 oz (64.9 kg)   SpO2 95%   BMI 30.96 kg/m    CC: 2 wk f/u visit  Subjective:    [name], female    DOB: November 05, 1927, 86 y.o.   MRN: [id number]  HPI: [name] is a 86 y.o. female presenting on 07/08/2019 for Follow-up (Here for 2 wk f/u.)   See prior note for details.  "R leg is messed up again" - painful to lateral and medial right leg. Treating with vaseline.   No falls in the last 2 weeks.   BP and HR better controlled today. She is unsure if she's taking diltiazem although brings it in today with her medications.      Relevant past medical, surgical, family and social history reviewed and updated as indicated. Interim medical history since our last visit reviewed. Allergies and medications reviewed and updated. Outpatient Medications Prior to Visit  Medication Sig Dispense Refill  . acetaminophen (TYLENOL) 500 MG tablet Take 500 mg by mouth every 6 (six) hours as needed for mild pain.    Marland Kitchen ALPRAZolam (XANAX) 1 MG tablet Take 0.5 tablets (0.5 mg total) by mouth 4 (four) times daily as needed for anxiety. 30 tablet 0  . apixaban (ELIQUIS) 5 MG TABS tablet Take 1 tablet (5 mg total) by mouth 2 (two) times daily. 60 tablet 11  . B Complex-C (B COMPLEX-VITAMIN C PO) Take by mouth daily. +zinc    . diltiazem (DILACOR XR) 120 MG 24 hr capsule Take 1 capsule (120 mg total) by mouth daily. 30 capsule 6  . furosemide (LASIX) 20 MG tablet TAKE 2 TABLETS BY MOUTH EVERY DAY 60 tablet 3  . glucosamine-chondroitin 500-400 MG tablet Take 1 tablet by mouth daily.     Marland Kitchen HYDROcodone-acetaminophen (NORCO/VICODIN) 5-325 MG tablet Take 0.5 tablets by mouth every 6 (six) hours as needed for moderate pain. 30 tablet 0  . metoprolol tartrate (LOPRESSOR) 50 MG tablet Take 75 mg  by mouth 2 (two) times daily. Takes 1.5 tablets 2 times a day    . Multiple Vitamins-Minerals (ICAPS) CAPS Take 1 capsule by mouth daily.    . OXcarbazepine (TRILEPTAL) 150 MG tablet Take 150 mg by mouth daily. Every evening    . pantoprazole (PROTONIX) 40 MG tablet TAKE 1 TABLET BY MOUTH TWICE A DAY 180 tablet 2  . pioglitazone (ACTOS) 30 MG tablet TAKE 1 TABLET BY MOUTH EVERY DAY 30 tablet 11  . polyethylene glycol powder (GLYCOLAX/MIRALAX) powder Take 17 g by mouth daily. Hold for diarrhea 3350 g 1  . pravastatin (PRAVACHOL) 20 MG tablet TAKE 1 TABLET BY MOUTH EVERY DAY 90 tablet 1  . sitaGLIPtin (JANUVIA) 100 MG tablet Take 1/2 tablet by mouth once daily. 30 tablet 2  . traZODone (DESYREL) 150 MG tablet Take 150 mg by mouth at bedtime.     . vitamin E 1000 UNIT capsule Take 1,000 Units by mouth daily.    . diclofenac sodium (VOLTAREN) 1 % GEL Apply 1 application topically 3 (three) times daily. 1 Tube 1  . glucose blood (ONETOUCH VERIO) test strip USE TO TEST BLOOD SUGAR ONCE DAILY. DX:E11.9 100 each 6  . loperamide (IMODIUM) 2 MG capsule Take 2 mg by mouth 4 (  four) times daily as needed for diarrhea or loose stools.    Marland Kitchen loratadine (CLARITIN) 10 MG tablet Take 10 mg by mouth daily as needed for allergies.    . nitroGLYCERIN (NITROSTAT) 0.4 MG SL tablet Place 1 tablet (0.4 mg total) under the tongue every 5 (five) minutes as needed for chest pain. 25 tablet 3  . nystatin cream (MYCOSTATIN) Apply 1 application topically 2 (two) times daily. 45 g 0  . Probiotic Product (ALIGN) 4 MG CAPS Take 1 capsule (4 mg total) by mouth daily.    . vitamin B-12 (CYANOCOBALAMIN) 1000 MCG tablet Take 1,000 mcg by mouth daily.    . vitamin E 400 UNIT capsule Take 400 Units by mouth daily.     No facility-administered medications prior to visit.     Per HPI unless specifically indicated in ROS section below Review of Systems Objective:    BP 118/68 (BP Location: Left Arm, Patient Position: Sitting, Cuff  Size: Normal)   Pulse 77   Temp 97.6 F (36.4 C) (Temporal)   Ht 4\' 9"  (1.448 m)   Wt 143 lb 1 oz (64.9 kg)   SpO2 95%   BMI 30.96 kg/m   Wt Readings from Last 3 Encounters:  07/08/19 143 lb 1 oz (64.9 kg)  06/25/19 147 lb 2 oz (66.7 kg)  06/18/19 147 lb 5 oz (66.8 kg)    Physical Exam Vitals and nursing note reviewed.  Constitutional:      Appearance: Normal appearance. She is not ill-appearing.  Cardiovascular:     Rate and Rhythm: Normal rate and regular rhythm.     Pulses: Normal pulses.     Heart sounds: Normal heart sounds. No murmur.     Comments: Sounds regular today Pulmonary:     Effort: Pulmonary effort is normal. No respiratory distress.     Breath sounds: Normal breath sounds. No wheezing, rhonchi or rales.  Musculoskeletal:        General: Swelling and tenderness present.     Right lower leg: No edema.     Left lower leg: No edema.     Comments: Longitudinal healing scar to R lateral leg   Skin:    General: Skin is warm and dry.  Neurological:     Mental Status: She is alert.  Psychiatric:        Mood and Affect: Mood normal.        Behavior: Behavior normal.       Results for orders placed or performed in visit on 06/18/19  TSH  Result Value Ref Range   TSH 2.93 0.35 - 4.50 uIU/mL  CBC with Differential/Platelet  Result Value Ref Range   WBC 8.1 4.0 - 10.5 K/uL   RBC 3.89 3.87 - 5.11 Mil/uL   Hemoglobin 12.0 12.0 - 15.0 g/dL   HCT 36.1 36.0 - 46.0 %   MCV 92.9 78.0 - 100.0 fl   MCHC 33.3 30.0 - 36.0 g/dL   RDW 14.2 11.5 - 15.5 %   Platelets 274.0 150.0 - 400.0 K/uL   Neutrophils Relative % 79.2 (H) 43.0 - 77.0 %   Lymphocytes Relative 11.0 (L) 12.0 - 46.0 %   Monocytes Relative 7.2 3.0 - 12.0 %   Eosinophils Relative 1.5 0.0 - 5.0 %   Basophils Relative 1.1 0.0 - 3.0 %   Neutro Abs 6.4 1.4 - 7.7 K/uL   Lymphs Abs 0.9 0.7 - 4.0 K/uL   Monocytes Absolute 0.6 0.1 - 1.0 K/uL   Eosinophils  Absolute 0.1 0.0 - 0.7 K/uL   Basophils Absolute 0.1  0.0 - 0.1 K/uL   *Note: Due to a large number of results and/or encounters for the requested time period, some results have not been displayed. A complete set of results can be found in Results Review.   Assessment & Plan:  This visit occurred during the SARS-CoV-2 public health emergency.  Safety protocols were in place, including screening questions prior to the visit, additional usage of staff PPE, and extensive cleaning of exam room while observing appropriate contact time as indicated for disinfecting solutions.   Problem List Items Addressed This Visit    Varicose veins of right lower extremity with both ulcer of calf and inflammation (HCC)    Evidence of healing wound to lateral lower leg, pt unsure if cause of wound.  Discussed wound care with vaseline.      Hypertension - Primary    Chronic, stable. Continue current regimen of metoprolol 75mg  bid and diltiazem CD 120mg  daily.       Chronic venous insufficiency    Ongoing discomfort despite compression socks. Pedal edema is stable.           No orders of the defined types were placed in this encounter.  No orders of the defined types were placed in this encounter.   Patient Instructions  You are doing better today! Heart is sounding regular.  You have healing wound on right lateral leg - continue vaseline daily.  Return in 1 month for follow up visit.   Follow up plan: Return in about 4 weeks (around 08/05/2019) for follow up visit.  Ria Bush, MD

## 2019-07-08 NOTE — Assessment & Plan Note (Addendum)
Evidence of healing wound to lateral lower leg, pt unsure if cause of wound.  Discussed wound care with vaseline.

## 2019-07-08 NOTE — Assessment & Plan Note (Signed)
Chronic, stable. Continue current regimen of metoprolol 75mg  bid and diltiazem CD 120mg  daily.

## 2019-07-11 ENCOUNTER — Other Ambulatory Visit: Payer: Self-pay | Admitting: Family Medicine

## 2019-07-11 NOTE — Telephone Encounter (Signed)
Name of Medication: Hydrocodone-APAP Name of Pharmacy: [facility] Last Fill or Written Date and Quantity: 05/12/19, #30 Last Office Visit and Type: 07/08/19, 2 wk f/u Next Office Visit and Type: 08/07/19, 1 mo f/u Last Controlled Substance Agreement Date: 10/11/17 Last UDS: 10/11/17

## 2019-07-11 NOTE — Telephone Encounter (Signed)
Patient called requesting a refill   HYDROcodone   Patient stated she is completely out of medication     [facility]

## 2019-07-12 ENCOUNTER — Telehealth: Payer: Self-pay

## 2019-07-12 MED ORDER — HYDROCODONE-ACETAMINOPHEN 5-325 MG PO TABS: 0.5000 | ORAL_TABLET | Freq: Four times a day (QID) | ORAL | 0 refills | Status: DC | PRN

## 2019-07-12 NOTE — Telephone Encounter (Signed)
[facility] - Client TELEPHONE ADVICE RECORD [facility]urse Patient Name: [name] Gender: Female DOB: 10-26-1927 Age: 86 Y 1 M 4 D Return Phone Number: [phone] (Primary) Address: [place] City/State/Zip: [place] [address] Client [facility] - Client Client Site [place] Physician Ria Bush - MD Contact Type Call Who Is Calling Patient / Member / Family / Caregiver Call Type Triage / Clinical Relationship To Patient Self Return Phone Number [phone] (Primary) Chief Complaint Weakness, Generalized Reason for Call Symptomatic / Request for Health Information Initial Comment Caller reports that she feels very weak, fatigued and sleepy. [place] Not Listed [place] Translation No Nurse Assessment Nurse: Kathi Ludwig, RN, Leana Roe Date/Time (Eastern Time): 07/12/2019 11:27:46 AM Confirm and document reason for call. If symptomatic, describe symptoms. ---Caller states she is tired, sleepy, and legs want to give out. increased weakness started 4 days ago. Was seen in office on Monday, right leg hurts. no fever Has the patient had close contact with a person known or suspected to have the novel coronavirus illness OR traveled / lives in area with major community spread (including international travel) in the last 14 days from the onset of symptoms? * If Asymptomatic, screen for exposure and travel within the last 14 days. ---No Does the patient have any new or worsening symptoms? ---Yes Will a triage be completed? ---Yes Related visit to physician within the last 2 weeks? ---Yes Does the PT have any chronic conditions? (i.e. diabetes, asthma, this includes High risk factors for pregnancy, etc.) ---Yes List chronic conditions. ---CAD, DM Is this a behavioral health or substance abuse call? ---No Guidelines Guideline Title Affirmed Question Affirmed Notes Nurse Date/Time  (Eastern Time) Weakness (Generalized) and Fatigue Patient sounds very sick or weak to the triager Kathi Ludwig, RN, Leana Roe 07/12/2019 11:30:33 AM Disp. Time Eilene Ghazi Time) Disposition Final User 07/12/2019 11:34:11 AM Go to ED Now (or PCP triage) Yes Kathi Ludwig, RN, Tracie PLEASE NOTE: All timestamps contained within this report are represented as [place] Standard Time. CONFIDENTIALTY NOTICE: This fax transmission is intended only for the addressee. It contains information that is legally privileged, confidential or otherwise protected from use or disclosure. If you are not the intended recipient, you are strictly prohibited from reviewing, disclosing, copying using or disseminating any of this information or taking any action in reliance on or regarding this information. If you have received this fax in error, please notify us immediately by telephone so that we can arrange for its return to [place]. Phone: [phone], Toll-Free: [phone], Fax: [phone] Page: 2 of 2 Call Id: [id number] [place] Disagree/Comply Disagree Caller Understands Yes PreDisposition Call Doctor Care Advice Given Per Guideline GO TO ED NOW (OR PCP TRIAGE): * IF NO PCP (PRIMARY CARE PROVIDER) SECOND-LEVEL TRIAGE: You need to be seen within the next hour. Go to the [facility] at _____________ [place] as soon as you can. ANOTHER ADULT SHOULD DRIVE: * It is better and safer if another adult drives instead of you. CARE ADVICE given per Weakness and Fatigue (Adult) guideline. Comments User: Estevan Ryder, RN Date/Time Eilene Ghazi Time): 07/12/2019 11:36:19 AM wants to be seen in office rather than UC or ER User: Estevan Ryder, RN Date/Time Eilene Ghazi Time): 07/12/2019 11:51:04 AM spoke with office User: Estevan Ryder, RN Date/Time Eilene Ghazi Time): 07/12/2019 11:52:20 AM patient wants to be seen in office instead Referrals [facility]

## 2019-07-12 NOTE — Telephone Encounter (Signed)
ERx 

## 2019-07-12 NOTE — Telephone Encounter (Signed)
No answer and no v/m has been set up.

## 2019-07-12 NOTE — Telephone Encounter (Signed)
Appropriate dispo is UCC or ER. If pt refuses, will see next week.

## 2019-07-12 NOTE — Telephone Encounter (Signed)
[name] Mackie RN with access nurse said pt was triaged for bilateral leg weakness to the point feels like legs will go out from under her. Pt refused ED disposition. I spoke with pt and she said leg weakness has been going on for about 1 wk but has worsened but pt refused UC or ED. Lavada Mesi that takes care of pt got on phone to schedule appt on 07/15/2019. Advised again should go for eval UC or ED. 911 declined. Lavada Mesi said to schedule appt on 07/15/19 at 2:30 with Dr Darnell Level and if pt worsens will get pt to ED. UC & ED precautions given and both pt and gentleman voiced understanding. Access nurse note has not been faxed or in portal yet and will send to Dr Darnell Level when available. FYI to Dr Valinda Hoar CMA said Dr Darnell Level has already added pt for today.

## 2019-07-15 ENCOUNTER — Ambulatory Visit (INDEPENDENT_AMBULATORY_CARE_PROVIDER_SITE_OTHER): Payer: Medicare Other | Admitting: Family Medicine

## 2019-07-15 ENCOUNTER — Ambulatory Visit
Admission: RE | Admit: 2019-07-15 | Discharge: 2019-07-15 | Disposition: A | Discharge status: Home / Self Care | Payer: Medicare Other | Source: Ambulatory Visit | Attending: Family Medicine | Admitting: Family Medicine

## 2019-07-15 ENCOUNTER — Other Ambulatory Visit: Payer: Self-pay

## 2019-07-15 ENCOUNTER — Encounter: Payer: Self-pay | Admitting: Family Medicine

## 2019-07-15 VITALS — BP 120/70 | HR 91 | Temp 97.8°F | Ht <= 58 in | Wt 147.0 lb

## 2019-07-15 DIAGNOSIS — M79661 Pain in right lower leg: Secondary | ICD-10-CM

## 2019-07-15 DIAGNOSIS — M7989 Other specified soft tissue disorders: Secondary | ICD-10-CM

## 2019-07-15 DIAGNOSIS — I25119 Atherosclerotic heart disease of native coronary artery with unspecified angina pectoris: Secondary | ICD-10-CM | POA: Diagnosis not present

## 2019-07-15 DIAGNOSIS — G8929 Other chronic pain: Secondary | ICD-10-CM | POA: Diagnosis not present

## 2019-07-15 DIAGNOSIS — I10 Essential (primary) hypertension: Secondary | ICD-10-CM

## 2019-07-15 DIAGNOSIS — I8311 Varicose veins of right lower extremity with inflammation: Secondary | ICD-10-CM | POA: Diagnosis not present

## 2019-07-15 DIAGNOSIS — M79604 Pain in right leg: Secondary | ICD-10-CM

## 2019-07-15 DIAGNOSIS — I5032 Chronic diastolic (congestive) heart failure: Secondary | ICD-10-CM | POA: Diagnosis not present

## 2019-07-15 DIAGNOSIS — M79605 Pain in left leg: Secondary | ICD-10-CM | POA: Diagnosis not present

## 2019-07-15 DIAGNOSIS — I872 Venous insufficiency (chronic) (peripheral): Secondary | ICD-10-CM | POA: Diagnosis not present

## 2019-07-15 MED ORDER — CEPHALEXIN 500 MG PO CAPS: 500.0000 mg | ORAL_CAPSULE | Freq: Three times a day (TID) | ORAL | 0 refills | Status: DC

## 2019-07-15 MED ORDER — SITAGLIPTIN PHOSPHATE 100 MG PO TABS: ORAL_TABLET | ORAL | 2 refills | Status: DC

## 2019-07-15 NOTE — Progress Notes (Signed)
This visit was conducted in person.  BP 120/70 (BP Location: Left Arm, Patient Position: Sitting, Cuff Size: Normal)   Pulse 91   Temp 97.8 F (36.6 C) (Temporal)   Ht 4\' 9"  (1.448 m)   Wt 147 lb (66.7 kg)   SpO2 95%   BMI 31.81 kg/m    CC: weakness "I've been so sick the last 4-5 days" Subjective:    [name], female    DOB: 1928/01/14, 86 y.o.   MRN: [id number]  HPI: [name] is a 86 y.o. female presenting on 07/15/2019 for Extremity Weakness (C/o bilateral leg weakness, feeling tired and sick.  Started about 1 wk ago.  Pt accompanied by friend/driver, Elyn Peers Marty]- temp ) and Alopecia (C/o losing hair. )   See recent phone note.  Called in Friday with bilateral leg weakness ongoing for the past week, declined ER/UCC eval.  One episode of vomiting on Saturday.  Coughing white phlegm.  Decreased appetite.  4 lb weight gain over the past week.   Denies fevers/chills, urine change or dysuria, diarrhea. No slurred speech.   H/o UTI and pneumonia late last year.  Again today refuses ER evaluation.      Relevant past medical, surgical, family and social history reviewed and updated as indicated. Interim medical history since our last visit reviewed. Allergies and medications reviewed and updated. Outpatient Medications Prior to Visit  Medication Sig Dispense Refill  . acetaminophen (TYLENOL) 500 MG tablet Take 500 mg by mouth every 6 (six) hours as needed for mild pain.    Marland Kitchen ALPRAZolam (XANAX) 1 MG tablet Take 0.5 tablets (0.5 mg total) by mouth 4 (four) times daily as needed for anxiety. 30 tablet 0  . apixaban (ELIQUIS) 5 MG TABS tablet Take 1 tablet (5 mg total) by mouth 2 (two) times daily. 60 tablet 11  . B Complex-C (B COMPLEX-VITAMIN C PO) Take by mouth daily. +zinc    . diclofenac sodium (VOLTAREN) 1 % GEL Apply 1 application topically 3 (three) times daily. 1 Tube 1  . diltiazem (DILACOR XR) 120 MG 24 hr capsule Take 1 capsule (120 mg  total) by mouth daily. 30 capsule 6  . furosemide (LASIX) 20 MG tablet TAKE 2 TABLETS BY MOUTH EVERY DAY 60 tablet 3  . glucosamine-chondroitin 500-400 MG tablet Take 1 tablet by mouth daily.     Marland Kitchen glucose blood (ONETOUCH VERIO) test strip USE TO TEST BLOOD SUGAR ONCE DAILY. DX:E11.9 100 each 6  . HYDROcodone-acetaminophen (NORCO/VICODIN) 5-325 MG tablet Take 0.5 tablets by mouth every 6 (six) hours as needed for moderate pain. 30 tablet 0  . loperamide (IMODIUM) 2 MG capsule Take 2 mg by mouth 4 (four) times daily as needed for diarrhea or loose stools.    Marland Kitchen loratadine (CLARITIN) 10 MG tablet Take 10 mg by mouth daily as needed for allergies.    . metoprolol tartrate (LOPRESSOR) 50 MG tablet Take 75 mg by mouth 2 (two) times daily. Takes 1.5 tablets 2 times a day    . Multiple Vitamins-Minerals (ICAPS) CAPS Take 1 capsule by mouth daily.    . nitroGLYCERIN (NITROSTAT) 0.4 MG SL tablet Place 1 tablet (0.4 mg total) under the tongue every 5 (five) minutes as needed for chest pain. 25 tablet 3  . nystatin cream (MYCOSTATIN) Apply 1 application topically 2 (two) times daily. 45 g 0  . OXcarbazepine (TRILEPTAL) 150 MG tablet Take 150 mg by mouth daily. Every evening    .  pantoprazole (PROTONIX) 40 MG tablet TAKE 1 TABLET BY MOUTH TWICE A DAY 180 tablet 2  . pioglitazone (ACTOS) 30 MG tablet TAKE 1 TABLET BY MOUTH EVERY DAY 30 tablet 11  . polyethylene glycol powder (GLYCOLAX/MIRALAX) powder Take 17 g by mouth daily. Hold for diarrhea 3350 g 1  . pravastatin (PRAVACHOL) 20 MG tablet TAKE 1 TABLET BY MOUTH EVERY DAY 90 tablet 1  . Probiotic Product (ALIGN) 4 MG CAPS Take 1 capsule (4 mg total) by mouth daily.    . traZODone (DESYREL) 150 MG tablet Take 150 mg by mouth at bedtime.     . vitamin E 1000 UNIT capsule Take 1,000 Units by mouth daily.    . sitaGLIPtin (JANUVIA) 100 MG tablet Take 1/2 tablet by mouth once daily. 30 tablet 2   No facility-administered medications prior to visit.     Per  HPI unless specifically indicated in ROS section below Review of Systems Objective:    BP 120/70 (BP Location: Left Arm, Patient Position: Sitting, Cuff Size: Normal)   Pulse 91   Temp 97.8 F (36.6 C) (Temporal)   Ht 4\' 9"  (1.448 m)   Wt 147 lb (66.7 kg)   SpO2 95%   BMI 31.81 kg/m   Wt Readings from Last 3 Encounters:  07/15/19 147 lb (66.7 kg)  07/08/19 143 lb 1 oz (64.9 kg)  06/25/19 147 lb 2 oz (66.7 kg)    Physical Exam Vitals and nursing note reviewed.  Constitutional:      Appearance: Normal appearance. She is obese. She is not ill-appearing.     Comments: Tired but nontoxic appearing  HENT:     Right Ear: Decreased hearing noted.     Left Ear: Decreased hearing noted.  Cardiovascular:     Rate and Rhythm: Normal rate and regular rhythm.     Pulses: Normal pulses.     Heart sounds: Normal heart sounds. No murmur.  Pulmonary:     Effort: Pulmonary effort is normal. No respiratory distress.     Breath sounds: Normal breath sounds. No wheezing, rhonchi or rales.  Abdominal:     General: Bowel sounds are normal. There is no distension.     Palpations: Abdomen is soft. There is no mass.     Tenderness: There is generalized abdominal tenderness (mild). There is no right CVA tenderness, left CVA tenderness, guarding or rebound.     Hernia: No hernia is present.  Musculoskeletal:     Right lower leg: Edema (1+) present.     Left lower leg: Edema (tr) present.     Comments:  Marked swelling to RLE without palpable cords Point tender to palpation lateral RLE with evidence of inflamed veins, mild erythema lateral RLE Diminished pulses BLE  Skin:    General: Skin is warm and dry.     Findings: Erythema (lateral RLE) present. No rash.  Neurological:     Mental Status: She is alert.  Psychiatric:        Mood and Affect: Mood is anxious.        Behavior: Behavior normal.       Results for orders placed or performed in visit on 07/15/19  D-dimer, quantitative (not at  [facility])  Result Value Ref Range   D-Dimer, Quant 0.90 (H) <0.50 mcg/mL FEU   *Note: Due to a large number of results and/or encounters for the requested time period, some results have not been displayed. A complete set of results can be found in Results Review.  US Venous Img Lower Unilateral Right CLINICAL DATA:  Pain and swelling right lower extremity.  EXAM: RIGHT LOWER EXTREMITY VENOUS DOPPLER ULTRASOUND  TECHNIQUE: Gray-scale sonography with compression, as well as color and duplex ultrasound, were performed to evaluate the deep venous system(s) from the level of the common femoral vein through the popliteal and proximal calf veins.  COMPARISON:  None.  FINDINGS: VENOUS  Normal compressibility of the common femoral, superficial femoral, and popliteal veins, as well as the visualized calf veins. Visualized portions of profunda femoral vein and great saphenous vein unremarkable. No filling defects to suggest DVT on grayscale or color Doppler imaging. Doppler waveforms show normal direction of venous flow, normal respiratory phasicity and response to augmentation.  Limited views of the contralateral common femoral vein are unremarkable.  OTHER  Subcutaneous edema over the right lower leg.  Limitations: none  IMPRESSION: No femoropopliteal DVT nor evidence of DVT within the visualized calf veins.  If clinical symptoms are inconsistent or if there are persistent or worsening symptoms, further imaging (possibly involving the iliac veins) may be warranted.  Electronically Signed   By: Marin Olp M.D.   On: 07/15/2019 17:21   Assessment & Plan:  This visit occurred during the SARS-CoV-2 public health emergency.  Safety protocols were in place, including screening questions prior to the visit, additional usage of staff PPE, and extensive cleaning of exam room while observing appropriate contact time as indicated for disinfecting solutions.   Problem List Items  Addressed This Visit    Varicose veins of right lower extremity with inflammation    No open sores or skin breakdown noted today. Marked swelling present.       Pain and swelling of right lower leg - Primary    Acute on chronic lower leg pain R>L, with acute asymmetric worsening of R pedal edema. She is on eliquis 5mg  bid. In h/o DVT, prudent to rpt venous US r/o recurrent DVT given asymmetric pain and swelling, even though she continues eliquis 5mg  bid.  Given erythema present, will cover cellulitis with keflex TID 1 wk course.      Relevant Orders   Basic metabolic panel   CBC with Differential/Platelet   Sedimentation rate   D-dimer, quantitative (not at [facility]) (Completed)   US Venous Img Lower Unilateral Right (Completed)   Hepatic function panel   Hypertension    BP stable today.       Chronic venous insufficiency    Regularly wears compression socks to below the knees      Chronic leg pain   (HFpEF) heart failure with preserved ejection fraction (HCC)    H/o this - denies current worsening dyspnea, chest pain, cough. She does have evidence of worsening asymmetrical pedal edema R>L - see below. Check BNP.           Meds ordered this encounter  Medications  . cephALEXin (KEFLEX) 500 MG capsule    Sig: Take 1 capsule (500 mg total) by mouth 3 (three) times daily.    Dispense:  21 capsule    Refill:  0   Orders Placed This Encounter  Procedures  . US Venous Img Lower Unilateral Right    Standing Status:   Future    Number of Occurrences:   1    Standing Expiration Date:   09/13/2020    Order Specific Question:   Reason for Exam (SYMPTOM  OR DIAGNOSIS REQUIRED)    Answer:   RLE pain and swelling    Order  Specific Question:   Preferred imaging location?    Answer:   [facility]    Order Specific Question:   Call Results- Best Contact Number?    Answer:   [phone] not necessary to hold   . Basic metabolic panel  . CBC with Differential/Platelet  .  Sedimentation rate  . D-dimer, quantitative (not at [facility])  . Hepatic function panel    Patient Instructions  Labs today Go to [facility] fo leg ultrasound to rule out blood clot as cause of right leg swelling. Start antibiotic sent to pharmacy for right leg redness and pain.  If worsening despite this, go to ER.   Follow up plan: Return if symptoms worsen or fail to improve.  Ria Bush, MD

## 2019-07-15 NOTE — Patient Instructions (Addendum)
Labs today Go to [facility] fo leg ultrasound to rule out blood clot as cause of right leg swelling. Start antibiotic sent to pharmacy for right leg redness and pain.  If worsening despite this, go to ER.

## 2019-07-16 ENCOUNTER — Telehealth: Payer: Self-pay | Admitting: Internal Medicine

## 2019-07-16 ENCOUNTER — Other Ambulatory Visit: Payer: Self-pay

## 2019-07-16 ENCOUNTER — Encounter: Payer: Self-pay | Admitting: Emergency Medicine

## 2019-07-16 ENCOUNTER — Emergency Department: Payer: Medicare Other

## 2019-07-16 ENCOUNTER — Emergency Department
Admission: EM | Admit: 2019-07-16 | Discharge: 2019-07-16 | Disposition: A | Discharge status: Home / Self Care | Payer: Medicare Other | Attending: Emergency Medicine | Admitting: Emergency Medicine

## 2019-07-16 DIAGNOSIS — Z7901 Long term (current) use of anticoagulants: Secondary | ICD-10-CM | POA: Diagnosis not present

## 2019-07-16 DIAGNOSIS — R918 Other nonspecific abnormal finding of lung field: Secondary | ICD-10-CM | POA: Diagnosis not present

## 2019-07-16 DIAGNOSIS — R600 Localized edema: Secondary | ICD-10-CM | POA: Diagnosis not present

## 2019-07-16 DIAGNOSIS — I10 Essential (primary) hypertension: Secondary | ICD-10-CM | POA: Insufficient documentation

## 2019-07-16 DIAGNOSIS — Z87891 Personal history of nicotine dependence: Secondary | ICD-10-CM | POA: Insufficient documentation

## 2019-07-16 DIAGNOSIS — E119 Type 2 diabetes mellitus without complications: Secondary | ICD-10-CM | POA: Insufficient documentation

## 2019-07-16 DIAGNOSIS — R0602 Shortness of breath: Secondary | ICD-10-CM | POA: Insufficient documentation

## 2019-07-16 DIAGNOSIS — I2510 Atherosclerotic heart disease of native coronary artery without angina pectoris: Secondary | ICD-10-CM | POA: Diagnosis not present

## 2019-07-16 DIAGNOSIS — R609 Edema, unspecified: Secondary | ICD-10-CM

## 2019-07-16 DIAGNOSIS — M79605 Pain in left leg: Secondary | ICD-10-CM | POA: Diagnosis present

## 2019-07-16 DIAGNOSIS — Z7984 Long term (current) use of oral hypoglycemic drugs: Secondary | ICD-10-CM | POA: Diagnosis not present

## 2019-07-16 DIAGNOSIS — Z79899 Other long term (current) drug therapy: Secondary | ICD-10-CM | POA: Insufficient documentation

## 2019-07-16 DIAGNOSIS — M79661 Pain in right lower leg: Secondary | ICD-10-CM | POA: Insufficient documentation

## 2019-07-16 LAB — CBC WITH DIFFERENTIAL/PLATELET
Abs Immature Granulocytes: 0.04 10*3/uL (ref 0.00–0.07)
Basophils Absolute: 0.1 10*3/uL (ref 0.0–0.1)
Basophils Absolute: 0.1 10*3/uL (ref 0.0–0.1)
Basophils Relative: 1.1 % (ref 0.0–3.0)
Basophils Relative: 1 %
Eosinophils Absolute: 0.2 10*3/uL (ref 0.0–0.7)
Eosinophils Absolute: 0.1 10*3/uL (ref 0.0–0.5)
Eosinophils Relative: 1.8 % (ref 0.0–5.0)
Eosinophils Relative: 1 %
HCT: 37.8 % (ref 36.0–46.0)
HCT: 40.8 % (ref 36.0–46.0)
Hemoglobin: 12.5 g/dL (ref 12.0–15.0)
Hemoglobin: 12.9 g/dL (ref 12.0–15.0)
Immature Granulocytes: 0 %
Lymphocytes Relative: 16.6 % (ref 12.0–46.0)
Lymphocytes Relative: 10 %
Lymphs Abs: 1.5 10*3/uL (ref 0.7–4.0)
Lymphs Abs: 0.9 10*3/uL (ref 0.7–4.0)
MCH: 29.8 pg (ref 26.0–34.0)
MCHC: 33.1 g/dL (ref 30.0–36.0)
MCHC: 31.6 g/dL (ref 30.0–36.0)
MCV: 91.2 fl (ref 78.0–100.0)
MCV: 94.2 fL (ref 80.0–100.0)
Monocytes Absolute: 0.8 10*3/uL (ref 0.1–1.0)
Monocytes Absolute: 0.7 10*3/uL (ref 0.1–1.0)
Monocytes Relative: 9 % (ref 3.0–12.0)
Monocytes Relative: 7 %
Neutro Abs: 6.6 10*3/uL (ref 1.4–7.7)
Neutro Abs: 7.9 10*3/uL — ABNORMAL HIGH (ref 1.7–7.7)
Neutrophils Relative %: 71.5 % (ref 43.0–77.0)
Neutrophils Relative %: 81 %
Platelets: 226 10*3/uL (ref 150.0–400.0)
Platelets: 212 10*3/uL (ref 150–400)
RBC: 4.14 Mil/uL (ref 3.87–5.11)
RBC: 4.33 MIL/uL (ref 3.87–5.11)
RDW: 14.8 % (ref 11.5–15.5)
RDW: 14 % (ref 11.5–15.5)
WBC: 9.2 10*3/uL (ref 4.0–10.5)
WBC: 9.7 10*3/uL (ref 4.0–10.5)
nRBC: 0 % (ref 0.0–0.2)

## 2019-07-16 LAB — HEPATIC FUNCTION PANEL
ALT: 15 U/L (ref 0–35)
AST: 20 U/L (ref 0–37)
Albumin: 3.8 g/dL (ref 3.5–5.2)
Alkaline Phosphatase: 112 U/L (ref 39–117)
Bilirubin, Direct: 0.2 mg/dL (ref 0.0–0.3)
Total Bilirubin: 0.6 mg/dL (ref 0.2–1.2)
Total Protein: 6.4 g/dL (ref 6.0–8.3)

## 2019-07-16 LAB — COMPREHENSIVE METABOLIC PANEL
ALT: 19 U/L (ref 0–44)
AST: 26 U/L (ref 15–41)
Albumin: 3.7 g/dL (ref 3.5–5.0)
Alkaline Phosphatase: 94 U/L (ref 38–126)
Anion gap: 11 (ref 5–15)
BUN: 29 mg/dL — ABNORMAL HIGH (ref 8–23)
CO2: 29 mmol/L (ref 22–32)
Calcium: 9.2 mg/dL (ref 8.9–10.3)
Chloride: 103 mmol/L (ref 98–111)
Creatinine, Ser: 1.02 mg/dL — ABNORMAL HIGH (ref 0.44–1.00)
GFR calc Af Amer: 56 mL/min — ABNORMAL LOW (ref >60)
GFR calc non Af Amer: 48 mL/min — ABNORMAL LOW (ref >60)
Glucose, Bld: 254 mg/dL — ABNORMAL HIGH (ref 70–99)
Potassium: 3.5 mmol/L (ref 3.5–5.1)
Sodium: 143 mmol/L (ref 135–145)
Total Bilirubin: 1 mg/dL (ref 0.3–1.2)
Total Protein: 7.2 g/dL (ref 6.5–8.1)

## 2019-07-16 LAB — URINALYSIS, COMPLETE (UACMP) WITH MICROSCOPIC
Bilirubin Urine: NEGATIVE
Glucose, UA: NEGATIVE mg/dL
Hgb urine dipstick: NEGATIVE
Ketones, ur: NEGATIVE mg/dL
Leukocytes,Ua: NEGATIVE
Nitrite: NEGATIVE
Protein, ur: NEGATIVE mg/dL
Specific Gravity, Urine: 1.017 (ref 1.005–1.030)
pH: 6 (ref 5.0–8.0)

## 2019-07-16 LAB — BRAIN NATRIURETIC PEPTIDE: B Natriuretic Peptide: 430 pg/mL — ABNORMAL HIGH (ref 0.0–100.0)

## 2019-07-16 LAB — BASIC METABOLIC PANEL
BUN: 31 mg/dL — ABNORMAL HIGH (ref 6–23)
CO2: 30 mEq/L (ref 19–32)
Calcium: 9 mg/dL (ref 8.4–10.5)
Chloride: 100 mEq/L (ref 96–112)
Creatinine, Ser: 1.13 mg/dL (ref 0.40–1.20)
GFR: 45.22 mL/min — ABNORMAL LOW (ref >60.00)
Glucose, Bld: 90 mg/dL (ref 70–99)
Potassium: 3.8 mEq/L (ref 3.5–5.1)
Sodium: 139 mEq/L (ref 135–145)

## 2019-07-16 LAB — D-DIMER, QUANTITATIVE: D-Dimer, Quant: 0.9 mcg/mL FEU — ABNORMAL HIGH (ref <0.50)

## 2019-07-16 LAB — SEDIMENTATION RATE: Sed Rate: 45 mm/hr — ABNORMAL HIGH (ref 0–30)

## 2019-07-16 LAB — TROPONIN I (HIGH SENSITIVITY): Troponin I (High Sensitivity): 11 ng/L (ref <18)

## 2019-07-16 LAB — LACTIC ACID, PLASMA: Lactic Acid, Venous: 1.9 mmol/L (ref 0.5–1.9)

## 2019-07-16 MED ORDER — IOHEXOL 350 MG/ML SOLN
75.0000 mL | Freq: Once | INTRAVENOUS | Status: AC | PRN
  Administered 2019-07-16: 15:00:00 75 mL via INTRAVENOUS

## 2019-07-16 MED ORDER — SODIUM CHLORIDE 0.9% FLUSH: 3.0000 mL | Freq: Once | INTRAVENOUS | Status: DC

## 2019-07-16 MED ORDER — POTASSIUM CHLORIDE ER 10 MEQ PO CPCR: 20.0000 meq | ORAL_CAPSULE | Freq: Every day | ORAL | 0 refills | Status: DC

## 2019-07-16 MED ORDER — FUROSEMIDE 10 MG/ML IJ SOLN
40.0000 mg | Freq: Once | INTRAMUSCULAR | Status: AC
  Administered 2019-07-16: 15:00:00 40 mg via INTRAVENOUS
  Filled 2019-07-16: qty 4

## 2019-07-16 NOTE — Telephone Encounter (Signed)
Called Ms Wisham back to follow up.  She was able to get a ride and plans to go on over to the ER for evaluation.

## 2019-07-16 NOTE — Assessment & Plan Note (Signed)
BP stable today.

## 2019-07-16 NOTE — Assessment & Plan Note (Addendum)
Acute on chronic lower leg pain R>L, with acute asymmetric worsening of R pedal edema. She is on eliquis 5mg  bid. In h/o DVT, prudent to rpt venous US r/o recurrent DVT given asymmetric pain and swelling, even though she continues eliquis 5mg  bid.  Given erythema present, will cover cellulitis with keflex TID 1 wk course.

## 2019-07-16 NOTE — Telephone Encounter (Signed)
Thank you. Will await eval.

## 2019-07-16 NOTE — ED Notes (Signed)
Pt taken to CT via stretcher.

## 2019-07-16 NOTE — ED Triage Notes (Addendum)
C/O right lower leg swelling x 5-6 days.    Swelling seen to right lower leg.  Skin area dark pink, warm to touch.  Patient had an [place] to right lower leg yesterday, negative for DVT.  Patient states she is currently taking antibiotics for an infection, unsure of what kind infection.

## 2019-07-16 NOTE — ED Provider Notes (Addendum)
[facility] Emergency Department Provider Note  ____________________________________________   First MD Initiated Contact with Patient 07/16/19 1334     (approximate)  I have reviewed the triage vital signs and the nursing notes.   HISTORY  Chief Complaint Leg Swelling    HPI [name] is a 86 y.o. female here with leg pain and swelling.  The patient has a complex past medical history as below, including CHF, DVT, chronic venous insufficiency, and chronic pain.  She reports that she has had right greater than left leg pain for the last several weeks.  She is had some mild shortness of breath.  She went to see her doctor for this yesterday and had lab work and an ultrasound ordered.  Her ultrasound was negative, but D-dimer returned positive so she was told to come to the ED.  She states that she has had some mild worsening leg pain, but otherwise is without complaints.  Denies any fevers or chills.  Denies any redness or warmth of the leg.  She does note that she has gained approximately 8 pounds over the last several weeks, despite not eating very much.  No other complaints.        Past Medical History:  Diagnosis Date  . Acute deep vein thrombosis (DVT) of distal vein of right lower extremity ([place]) 12/2013   Acute thrombus of R proximal gastrocnemius vein. Symptomatic provoked distal DVT  . Anxiety   . Cellulitis of leg, left 01/16/2014  . Chronic back pain 03/2012   lumbar DDD with herniation - s/p L S1 transforaminal ESI (10/2012 Dr. Sharlet Salina)  . Chronic venous insufficiency   . Coronary artery disease    CABG 1998  . Cystocele   . Depression   . Diabetes mellitus    type 2, followed by endo.  . Diastolic dysfunction   . Diverticulosis   . Hemorrhoids   . History of heart attack   . Hx of echocardiogram    a. Echo 10/13: mild LVH, EF 55-60%, Gr 1 diast dysfn, PASP 32  . Hyperlipidemia   . Hypertension   . Irritable bowel syndrome   .  Microcytic anemia   . PAD (peripheral artery disease) ([name]) 2013   ABI: R 0.91, L 0.83  . Postmenopausal osteoporosis 01/2019   T score of -2.6  . PUD (peptic ulcer disease)   . Varicose veins   . Venous ulcer of leg ([place]) 12/12/2013   Required prolonged treatment by wound center  . Viral gastroenteritis due to [place] virus 05/13/2015   hospitalization    Patient Active Problem List   Diagnosis Date Noted  . Pain and swelling of right lower leg 07/16/2019  . Breast mass 04/30/2019  . Pneumonia 02/15/2019  . E coli bacteremia 02/13/2019  . E. coli UTI 02/13/2019  . Dyspnea   . Diverticulitis of duodenum   . Duodenal ulcer perforation ([place]) 02/08/2019  . Chronic leg pain 11/14/2018  . Atherosclerosis of native arteries of the extremities with ulceration ([place]) 08/21/2018  . Lumbar strain, initial encounter 07/18/2018  . Chronic constipation 06/18/2018  . Erythema of breast 04/18/2018  . MRSA cellulitis 03/31/2018  . Advanced directives, counseling/discussion 01/29/2018  . Bilateral hearing loss 01/29/2018  . Spinal stenosis of lumbosacral region 01/08/2018  . Acute right-sided low back pain 12/26/2017  . Systolic murmur 56/21/3086  . De Quervain's tenosynovitis, left 01/09/2017  . Encounter for chronic pain management 02/09/2016  . Paroxysmal atrial fibrillation (HCC)   .  Varicose veins of left lower extremity with inflammation, with ulcer of calf limited to breakdown of skin ([place]) 05/22/2015  . Varicose veins of right lower extremity with inflammation 01/23/2015  . Diabetes mellitus type 2 with peripheral artery disease ([place])   . Chronic venous insufficiency   . Bleeding from varicose veins of lower extremity, left 02/13/2014  . Diabetic polyneuropathy ([place]) 11/19/2013  . Type 2 diabetes mellitus with neurological manifestations, controlled ([place]) 11/19/2013  . Right shoulder pain 08/19/2013  . Cerumen impaction 04/16/2013  . Abdominal pain 05/14/2012  . Peptic ulcer disease    . Unsteady gait 12/13/2011  . Chronic lower back pain 09/14/2011  . Microcytic anemia 09/06/2010  . Hyperlipidemia 09/06/2010  . GAD (generalized anxiety disorder) 03/15/2010  . Obesity, Class I, BMI 30-34.9 03/15/2010  . Hypertension 03/15/2010  . (HFpEF) heart failure with preserved ejection fraction ([place]) 03/15/2010  . Coronary artery disease     Past Surgical History:  Procedure Laterality Date  . ABDOMINAL HYSTERECTOMY  1975   TAH,BSO  . ABI  2013   R 0.91, L 0.83  . APPENDECTOMY     per pt report  . CARDIOVASCULAR STRESS TEST  2015   low risk myoview (Nahser)  . CATARACT SURGERY    . CHOLECYSTECTOMY    . COLONOSCOPY  02/2010   extensive diverticulosis throughout, small internal hemorrhoids, rec rpt 5 yrs (Dr. Allyn Kenner)  . CORONARY ARTERY BYPASS GRAFT  1998  . dexa scan  09/2011   femur -0.8, forearm 0.6 => normal  . ENDOVENOUS ABLATION SAPHENOUS VEIN W/ LASER Left 03/2014   Kellie Simmering  . ESI  2014   L S1 transforaminal ESI x2 ([place]) without improvement  . HAND SURGERY    . lexiscan myoview  03/2011   Small basal inferolateral and anterolateral reversible perfusion defect suggests ischemia.  EF was normal.   old infarct, no new ischemia  . LOWER EXTREMITY ANGIOGRAPHY Right 09/06/2018   Procedure: LOWER EXTREMITY ANGIOGRAPHY;  Surgeon: Algernon Huxley, MD;  Location: [facility];  Service: Cardiovascular;  Laterality: Right;  . toe surgery    . TYMPANOPLASTY  1960s   R side  . Vault prolapse A & P repair  2009   [facility]    Prior to Admission medications   Medication Sig Start Date End Date Taking? Authorizing Provider  acetaminophen (TYLENOL) 500 MG tablet Take 500 mg by mouth every 6 (six) hours as needed for mild pain.    [provider]  ALPRAZolam Duanne Moron) 1 MG tablet Take 0.5 tablets (0.5 mg total) by mouth 4 (four) times daily as needed for anxiety. 02/20/19   Ezekiel Slocumb, DO  apixaban (ELIQUIS) 5 MG TABS tablet Take 1  tablet (5 mg total) by mouth 2 (two) times daily. 12/13/18   Nahser, Wonda Cheng, MD  B Complex-C (B COMPLEX-VITAMIN C PO) Take by mouth daily. +zinc    [provider]  cephALEXin (KEFLEX) 500 MG capsule Take 1 capsule (500 mg total) by mouth 3 (three) times daily. 07/15/19   Ria Bush, MD  diclofenac sodium (VOLTAREN) 1 % GEL Apply 1 application topically 3 (three) times daily. 01/09/17   Ria Bush, MD  diltiazem (DILACOR XR) 120 MG 24 hr capsule Take 1 capsule (120 mg total) by mouth daily. 06/04/19   Ria Bush, MD  furosemide (LASIX) 20 MG tablet TAKE 2 TABLETS BY MOUTH EVERY DAY 06/28/19   Ria Bush, MD  glucosamine-chondroitin 500-400 MG tablet Take 1  tablet by mouth daily.     [provider]  glucose blood (ONETOUCH VERIO) test strip USE TO TEST BLOOD SUGAR ONCE DAILY. DX:E11.9 06/11/19   Elayne Snare, MD  HYDROcodone-acetaminophen (NORCO/VICODIN) 5-325 MG tablet Take 0.5 tablets by mouth every 6 (six) hours as needed for moderate pain. 07/12/19   Ria Bush, MD  loperamide (IMODIUM) 2 MG capsule Take 2 mg by mouth 4 (four) times daily as needed for diarrhea or loose stools.    [provider]  loratadine (CLARITIN) 10 MG tablet Take 10 mg by mouth daily as needed for allergies.    [provider]  metoprolol tartrate (LOPRESSOR) 50 MG tablet Take 75 mg by mouth 2 (two) times daily. Takes 1.5 tablets 2 times a day 06/25/19   Ria Bush, MD  Multiple Vitamins-Minerals (ICAPS) CAPS Take 1 capsule by mouth daily.    [provider]  nitroGLYCERIN (NITROSTAT) 0.4 MG SL tablet Place 1 tablet (0.4 mg total) under the tongue every 5 (five) minutes as needed for chest pain. 02/02/17   Nahser, Wonda Cheng, MD  nystatin cream (MYCOSTATIN) Apply 1 application topically 2 (two) times daily. 09/04/18   Ria Bush, MD  OXcarbazepine (TRILEPTAL) 150 MG tablet Take 150 mg by mouth daily. Every evening    [provider]    pantoprazole (PROTONIX) 40 MG tablet TAKE 1 TABLET BY MOUTH TWICE A DAY 06/27/19   Ria Bush, MD  pioglitazone (ACTOS) 30 MG tablet TAKE 1 TABLET BY MOUTH EVERY DAY 01/28/19   Elayne Snare, MD  polyethylene glycol powder (GLYCOLAX/MIRALAX) powder Take 17 g by mouth daily. Hold for diarrhea 06/18/18   Ria Bush, MD  potassium chloride (MICRO-K) 10 MEQ CR capsule Take 2 capsules (20 mEq total) by mouth daily for 3 days. 07/16/19 07/19/19  Duffy Bruce, MD  pravastatin (PRAVACHOL) 20 MG tablet TAKE 1 TABLET BY MOUTH EVERY DAY 02/04/19   Ria Bush, MD  Probiotic Product (ALIGN) 4 MG CAPS Take 1 capsule (4 mg total) by mouth daily. 08/15/17   Ria Bush, MD  sitaGLIPtin (JANUVIA) 100 MG tablet Take 1/2 tablet by mouth once daily. 07/15/19   Elayne Snare, MD  traZODone (DESYREL) 150 MG tablet Take 150 mg by mouth at bedtime.     [provider]  vitamin E 1000 UNIT capsule Take 1,000 Units by mouth daily.    [provider]    Allergies Metformin and related  Family History  Problem Relation Age of Onset  . Hypertension Mother   . Stroke Mother   . Diabetes Father   . Diabetes Sister   . Diabetes Brother   . Heart disease Son   . Colon cancer Neg Hx     Social History Social History   Tobacco Use  . Smoking status: Former Smoker    Quit date: 03/05/1979    Years since quitting: 40.3  . Smokeless tobacco: Never Used  Substance Use Topics  . Alcohol use: No    Alcohol/week: 0.0 standard drinks  . Drug use: No    Review of Systems  Review of Systems  Constitutional: Positive for fatigue. Negative for fever.  HENT: Negative for congestion and sore throat.   Eyes: Negative for visual disturbance.  Respiratory: Positive for cough. Negative for shortness of breath.   Cardiovascular: Positive for leg swelling. Negative for chest pain.  Gastrointestinal: Negative for abdominal pain, diarrhea, nausea and vomiting.  Genitourinary: Negative for  flank pain.  Musculoskeletal: Negative for back pain and neck  pain.  Skin: Negative for rash and wound.  Neurological: Negative for weakness.     ____________________________________________  PHYSICAL EXAM:      VITAL SIGNS: ED Triage Vitals  Enc Vitals Group     BP 07/16/19 1028 (!) 127/49     Pulse Rate 07/16/19 1028 78     Resp 07/16/19 1028 16     Temp 07/16/19 1028 98.5 F (36.9 C)     Temp Source 07/16/19 1028 Oral     SpO2 07/16/19 1028 96 %     Weight 07/16/19 1024 147 lb 0.8 oz (66.7 kg)     Height 07/16/19 1024 4\' 9"  (1.448 m)     Head Circumference --      Peak Flow --      Pain Score 07/16/19 1024 6     Pain Loc --      Pain Edu? --      Excl. in [place]? --      Physical Exam Vitals and nursing note reviewed.  Constitutional:      General: She is not in acute distress.    Appearance: She is well-developed.  HENT:     Head: Normocephalic and atraumatic.  Eyes:     Conjunctiva/sclera: Conjunctivae normal.  Cardiovascular:     Rate and Rhythm: Normal rate and regular rhythm.     Heart sounds: Normal heart sounds. No murmur. No friction rub.  Pulmonary:     Effort: Pulmonary effort is normal. No respiratory distress.     Breath sounds: Rales (mild basilar) present. No wheezing.  Abdominal:     General: Abdomen is flat. There is no distension.     Palpations: Abdomen is soft.     Tenderness: There is no abdominal tenderness.  Musculoskeletal:     Cervical back: Neck supple.     Right lower leg: Edema present.     Left lower leg: Edema present.     Comments: 2+ pitting edema bilateral LE. Venous stasis changes RLE with slight asymmetry of R>L swelling. 2+ DP and PT pulses. No open wounds. No warmth.  Skin:    General: Skin is warm.     Capillary Refill: Capillary refill takes less than 2 seconds.  Neurological:     Mental Status: She is alert and oriented to person, place, and time.     Motor: No abnormal muscle tone.        ____________________________________________   LABS (all labs ordered are listed, but only abnormal results are displayed)  Labs Reviewed  COMPREHENSIVE METABOLIC PANEL - Abnormal; Notable for the following components:      Result Value   Glucose, Bld 254 (*)    BUN 29 (*)    Creatinine, Ser 1.02 (*)    GFR calc non Af Amer 48 (*)    GFR calc Af Amer 56 (*)    All other components within normal limits  CBC WITH DIFFERENTIAL/PLATELET - Abnormal; Notable for the following components:   Neutro Abs 7.9 (*)    All other components within normal limits  URINALYSIS, COMPLETE (UACMP) WITH MICROSCOPIC - Abnormal; Notable for the following components:   Color, Urine YELLOW (*)    APPearance HAZY (*)    Bacteria, UA RARE (*)    All other components within normal limits  BRAIN NATRIURETIC PEPTIDE - Abnormal; Notable for the following components:   B Natriuretic Peptide 430.0 (*)    All other components within normal limits  LACTIC ACID, PLASMA  LACTIC ACID, PLASMA  TROPONIN I (HIGH SENSITIVITY)  TROPONIN I (HIGH SENSITIVITY)    ____________________________________________ ________________________________________  RADIOLOGY All imaging, including plain films, CT scans, and ultrasounds, independently reviewed by me, and interpretations confirmed via formal radiology reads.  ED MD interpretation:   CXR: Clear CT Angio: Neg PE, no heart failure, CAD  Official radiology report(s): DG Chest 2 View  Result Date: 07/16/2019 CLINICAL DATA:  Leg swelling, on antibiotics EXAM: CHEST - 2 VIEW COMPARISON:  02/12/2019 FINDINGS: Bilateral interstitial and patchy alveolar airspace opacities. No pleural effusion or pneumothorax. Stable cardiomegaly. Prior CABG. No acute osseous abnormality. Mild osteoarthritis of the right glenohumeral joint. IMPRESSION: Cardiomegaly with bilateral interstitial and patchy alveolar airspace opacities which may reflect pulmonary edema versus multifocal pneumonia.  Electronically Signed   By: Kathreen Devoid   On: 07/16/2019 11:19   CT Angio Chest PE W and/or Wo Contrast  Result Date: 07/16/2019 CLINICAL DATA:  Pain and swelling right lower extremity. Positive D-dimer. Shortness of breath. EXAM: CT ANGIOGRAPHY CHEST WITH CONTRAST TECHNIQUE: Multidetector CT imaging of the chest was performed using the standard protocol during bolus administration of intravenous contrast. Multiplanar CT image reconstructions and MIPs were obtained to evaluate the vascular anatomy. CONTRAST:  66mL OMNIPAQUE IOHEXOL 350 MG/ML SOLN COMPARISON:  Two-view chest x-ray 07/16/2019. CTA of the chest 02/14/2019 FINDINGS: Cardiovascular: The heart is enlarged, stable. Coronary artery calcifications are again seen. No significant pericardial effusion is present. Median sternotomy for CABG is evident. Atherosclerotic changes are noted at the aorta without aneurysm. Pulmonary artery opacification is excellent. No focal filling defects are present to suggest pulmonary emboli. Mediastinum/Nodes: No significant mediastinal, hilar, or axillary adenopathy is present. Thoracic inlet is normal. Esophagus is normal. Lungs/Pleura: Left lower lobe pleural thickening is again noted. Mild atelectasis is present without nodule or mass lesion. No effusions are present. Previously noted edema is no longer present. Upper Abdomen: Atherosclerotic changes are again noted. Solid organs are unremarkable. Musculoskeletal: Stable chronic degenerative changes are present in the thoracic spine. Ribs are within normal limits. Review of the MIP images confirms the above findings. IMPRESSION: 1. No pulmonary embolus. 2. Cardiomegaly without failure. 3. Coronary artery disease. 4. Stable left lower lobe pleural thickening. 5. Aortic Atherosclerosis (ICD10-I70.0). Electronically Signed   By: San Morelle M.D.   On: 07/16/2019 15:59    ____________________________________________  PROCEDURES   Procedure(s) performed  (including Critical Care):  .1-3 Lead EKG Interpretation Performed by: Duffy Bruce, MD Authorized by: Duffy Bruce, MD     Interpretation: non-specific     ECG rate:  70-80s   ECG rate assessment: normal     Rhythm: atrial fibrillation     Ectopy: none     Conduction: normal   Comments:     Indication: Leg swelling, possible PE    ____________________________________________  INITIAL IMPRESSION / MDM / ASSESSMENT AND PLAN / ED COURSE  As part of my medical decision making, I reviewed the following data within the [facility] notes reviewed and incorporated, Old chart reviewed, Notes from prior ED visits, and [place] Controlled Substance Database       *[name] was evaluated in Emergency Department on 07/16/2019 for the symptoms described in the history of present illness. She was evaluated in the context of the global COVID-19 pandemic, which necessitated consideration that the patient might be at risk for infection with the SARS-CoV-2 virus that causes COVID-19. Institutional protocols and algorithms that pertain to the evaluation of patients at risk for COVID-19 are in a  state of rapid change based on information released by regulatory bodies including the CDC and federal and state organizations. These policies and algorithms were followed during the patient's care in the ED.  Some ED evaluations and interventions may be delayed as a result of limited staffing during the pandemic.*     Medical Decision Making: 86 year old female here with positive D-dimer and right leg swelling.  Recent ultrasound yesterday is negative for DVT but D-dimer positive, so CT angio obtained here and is unremarkable.  She does have possible mild edema on her chest x-ray and clinically, she appears moderately hypervolemic.  I suspect she likely has venous insufficiency and CHF.  Will have her increase her Lasix.  She otherwise feels very well, is ambulatory without  difficulty, and is not hypoxic.  She would like to manage this as an outpatient which I think is reasonable. Will have her double lasix x 3 days, with potassium supplementation, and follow-up with her primary. She was also recently prescribed Keflex by her PCP which she will continue. Encouraged her to return with any worsening leg swelling, pain, shortness of breath, or chest pain.  ____________________________________________  FINAL CLINICAL IMPRESSION(S) / ED DIAGNOSES  Final diagnoses:  Peripheral edema     MEDICATIONS GIVEN DURING THIS VISIT:  Medications  furosemide (LASIX) injection 40 mg (40 mg Intravenous Given 07/16/19 1459)  iohexol (OMNIPAQUE) 350 MG/ML injection 75 mL (75 mLs Intravenous Contrast Given 07/16/19 1519)     ED Discharge Orders         Ordered    potassium chloride (MICRO-K) 10 MEQ CR capsule  Daily     07/16/19 1616           Note:  This document was prepared using Dragon voice recognition software and may include unintentional dictation errors.   Duffy Bruce, MD 07/16/19 [employer]    Duffy Bruce, MD 07/16/19 [phone]

## 2019-07-16 NOTE — ED Notes (Signed)
Pt cleaned up and dressed in clothes.. pt assisted to wheelchair. Pt verbalized discharge instructions and has no questions at this time.

## 2019-07-16 NOTE — ED Notes (Signed)
Called for pt ride at this time.

## 2019-07-16 NOTE — Assessment & Plan Note (Signed)
H/o this - denies current worsening dyspnea, chest pain, cough. She does have evidence of worsening asymmetrical pedal edema R>L - see below. Check BNP.

## 2019-07-16 NOTE — ED Notes (Signed)
[place]

## 2019-07-16 NOTE — Discharge Instructions (Addendum)
START TAKING YOUR FUROSEMIDE (LASIX) TWICE A DAY FOR THE NEXT 3 DAYS, INSTEAD OF ONCE A DAY  AFTER 3 DAYS, GO BACK TO ONCE A DAY  START TAKING THE POTASSIUM SUPPLEMENT WHEN DOING THIS  CALL YOUR DOCTOR FOR FOLLOW-UP IN 1 WEEK

## 2019-07-16 NOTE — Assessment & Plan Note (Signed)
No open sores or skin breakdown noted today. Marked swelling present.

## 2019-07-16 NOTE — Assessment & Plan Note (Signed)
Regularly wears compression socks to below the knees

## 2019-07-16 NOTE — Telephone Encounter (Signed)
Received call from on call nurse with critical lab value.  Reports positive  D Dimer .9.  Called pt and informed of results.  She reports she is normally able to clean and mop her house and has not been able to do this for several days.  Denies chest pain.  Discussed ER evaluation.  She is going to talk with her friend - to go to ER.  Discussed the need for further evaluation.  She agreed.

## 2019-07-16 NOTE — Telephone Encounter (Signed)
Noted. Thank you for talking to her.

## 2019-07-16 NOTE — Telephone Encounter (Signed)
[place] Night - Client TELEPHONE ADVICE RECORD [facility] Patient Name: [name] Gender: Female DOB: 08/15/1927 Age: 86 Y 1 M 45 D Return Phone Number: [phone] (Primary) Address: City/State/Zip: [place] [address] Client [place] Night - Client Client Site [place] Physician Ria Bush - MD Contact Type Call Who Is Calling Lab Lab Name [facility] Lab Phone Number [phone] Lab Tech Name Barnett Applebaum Lab Reference Number [id number] N Chief Complaint Lab Result (Critical or Stat) Call Type Lab Send to RN Reason for Call Report lab results Initial Comment Caller is from [facility] and she has a critical value for a pt. Additional Comment Translation No Nurse Assessment Nurse: Melina Modena, RN, [name] Bamberg Date/Time Eilene Ghazi Time): 07/16/2019 6:44:17 AM Is there an on-call provider listed? ---Yes Please list name of person reporting value (Lab Employee) and a contact number. ---[phone] Sherol Dade Please document the following items: Lab name Lab value (read back to lab to verify) Reference range for lab value Date and time blood was drawn Collect time of birth for bilirubin results ---4/12 /2019 1522 0.90 Dimer less than .50 is normal no previous Please collect the patient contact information from the lab. (name, phone number and address) ---Shaunie Boehm [phone] Guidelines Guideline Title Affirmed Question Affirmed Notes Nurse Date/Time (Eastern Time) Disp. Time Eilene Ghazi Time) Disposition Final User 07/16/2019 6:52:17 AM Called On-Call Provider [place], RN, [name] Bamberg 07/16/2019 6:52:48 AM Lab Call Melina Modena, RN, [name] Bamberg Reason: see note 07/16/2019 6:53:11 AM Clinical Call Yes Melina Modena, RN, [name] Bamberg PLEASE NOTE: All timestamps contained within this report are represented as [place] Standard Time. CONFIDENTIALTY NOTICE: This fax transmission is intended only for the addressee. It contains  information that is legally privileged, confidential or otherwise protected from use or disclosure. If you are not the intended recipient, you are strictly prohibited from reviewing, disclosing, copying using or disseminating any of this information or taking any action in reliance on or regarding this information. If you have received this fax in error, please notify us immediately by telephone so that we can arrange for its return to [place]. Phone: [phone], Toll-Free: [phone], Fax: [phone] Page: 2 of 2 Call Id: [id number] Paging DoctorName Phone DateTime Result/Outcome Message Type Notes Einar Pheasant - MD [phone] 07/16/2019 6:52:17 AM Called On Call Provider - Reached Doctor Paged Einar Pheasant - MD 07/16/2019 6:52:25 AM Spoke with On Call - General Message Result md aware of lab

## 2019-07-16 NOTE — ED Notes (Signed)
ED Provider at bedside. 

## 2019-07-16 NOTE — Telephone Encounter (Addendum)
Per chart review tab cannot see where pt went to ED. No answer and no v/m set up on pts phone. I spoke with [name] and he said Jerrye Beavers had picked pt up and pt should have been at [facility] ED 1/2 hr ago. Jerrye Beavers answered pt phone and pt is filling out info now at [facility] ED for eval. FYI to Dr Darnell Level.

## 2019-07-17 ENCOUNTER — Encounter: Payer: Self-pay | Admitting: Dermatology

## 2019-07-17 ENCOUNTER — Other Ambulatory Visit: Payer: Self-pay

## 2019-07-17 ENCOUNTER — Ambulatory Visit (INDEPENDENT_AMBULATORY_CARE_PROVIDER_SITE_OTHER): Payer: Medicare Other | Admitting: Dermatology

## 2019-07-17 DIAGNOSIS — L570 Actinic keratosis: Secondary | ICD-10-CM

## 2019-07-17 DIAGNOSIS — L821 Other seborrheic keratosis: Secondary | ICD-10-CM | POA: Diagnosis not present

## 2019-07-17 DIAGNOSIS — L578 Other skin changes due to chronic exposure to nonionizing radiation: Secondary | ICD-10-CM | POA: Diagnosis not present

## 2019-07-17 DIAGNOSIS — Z872 Personal history of diseases of the skin and subcutaneous tissue: Secondary | ICD-10-CM

## 2019-07-17 DIAGNOSIS — D1801 Hemangioma of skin and subcutaneous tissue: Secondary | ICD-10-CM

## 2019-07-17 DIAGNOSIS — L905 Scar conditions and fibrosis of skin: Secondary | ICD-10-CM

## 2019-07-17 DIAGNOSIS — Z85828 Personal history of other malignant neoplasm of skin: Secondary | ICD-10-CM | POA: Diagnosis not present

## 2019-07-17 DIAGNOSIS — R238 Other skin changes: Secondary | ICD-10-CM

## 2019-07-17 NOTE — Patient Instructions (Addendum)
Cryotherapy Aftercare  . Wash gently with soap and water everyday.   Marland Kitchen Apply Vaseline and Band-Aid daily until healed.  Recommend daily broad spectrum sunscreen SPF 30+ to sun-exposed areas, reapply every 2 hours as needed. Call for new or changing lesions.  If spot on top lip does not go away in one month call for appointment to have rechecked.

## 2019-07-17 NOTE — Progress Notes (Signed)
   Follow-Up Visit   Subjective  [name] is a 86 y.o. female who presents for the following: Follow-up (HxAK's, BCC).  Patient has a spot on right cheek that she thinks is new but no symptoms. She notices it when she washes her face and puts her make up on.  Recheck AK's treated with LN2 in October 2020 at [facility] and [facility].   The following portions of the chart were reviewed this encounter and updated as appropriate: Tobacco  Allergies  Meds  Problems  Med Hx  Surg Hx  Fam Hx      Review of Systems: No other skin or systemic complaints.  Objective  Well appearing patient in no apparent distress; mood and affect are within normal limits.  A focused examination was performed including face, arms, trunk, legs, scalp. Relevant physical exam findings are noted in the Assessment and Plan.  Objective  mid upper lip vermillion border x 1: Erythematous thin papules/macules with gritty scale.   Objective  Right nasal bridge: Well healed scar with no evidence of recurrence.   Objective  Glabella: Dyspigmented smooth macule or patch.   Objective  Lower Lip: Purple thin papule  Assessment & Plan    Actinic Damage - diffuse scaly erythematous macules with underlying dyspigmentation - Recommend daily broad spectrum sunscreen SPF 30+ to sun-exposed areas, reapply every 2 hours as needed.  - Call for new or changing lesions.  Seborrheic Keratoses - Stuck-on, waxy, tan-brown papules and plaques  - Discussed benign etiology and prognosis. - Observe - Call for any changes   AK (actinic keratosis) mid upper lip vermillion border x 1  Patient to call in 1 month and RTC if persistent.   Destruction of lesion - mid upper lip vermillion border x 1 Complexity: simple   Destruction method: cryotherapy   Informed consent: discussed and consent obtained   Lesion destroyed using liquid nitrogen: Yes   Region frozen until ice ball extended beyond lesion: Yes     Outcome: patient tolerated procedure well with no complications   Post-procedure details: wound care instructions given    History of basal cell carcinoma (BCC) Right nasal bridge  No evidence of recurrence, call clinic for new or changing lesions.   Scar Glabella  Benign, observe.     Venous lake of lip Lower Lip  Benign, observe.    Return in about 6 months (around 01/16/2020) for TBSE.   Graciella Belton, RMA, am acting as scribe for Forest Gleason, MD .  Documentation: I have reviewed the above documentation for accuracy and completeness, and I agree with the above.  Forest Gleason, MD

## 2019-07-18 ENCOUNTER — Ambulatory Visit (INDEPENDENT_AMBULATORY_CARE_PROVIDER_SITE_OTHER): Payer: Medicare Other | Admitting: Family Medicine

## 2019-07-18 ENCOUNTER — Encounter: Payer: Self-pay | Admitting: Family Medicine

## 2019-07-18 VITALS — BP 128/62 | HR 108 | Temp 97.6°F | Ht <= 58 in | Wt 143.2 lb

## 2019-07-18 DIAGNOSIS — M79661 Pain in right lower leg: Secondary | ICD-10-CM

## 2019-07-18 DIAGNOSIS — I5030 Unspecified diastolic (congestive) heart failure: Secondary | ICD-10-CM | POA: Diagnosis not present

## 2019-07-18 DIAGNOSIS — I25119 Atherosclerotic heart disease of native coronary artery with unspecified angina pectoris: Secondary | ICD-10-CM

## 2019-07-18 DIAGNOSIS — I8311 Varicose veins of right lower extremity with inflammation: Secondary | ICD-10-CM | POA: Diagnosis not present

## 2019-07-18 DIAGNOSIS — M7989 Other specified soft tissue disorders: Secondary | ICD-10-CM | POA: Diagnosis not present

## 2019-07-18 DIAGNOSIS — I872 Venous insufficiency (chronic) (peripheral): Secondary | ICD-10-CM | POA: Diagnosis not present

## 2019-07-18 NOTE — Assessment & Plan Note (Signed)
Recent RLE venous US and chest CTA returned normal.

## 2019-07-18 NOTE — Assessment & Plan Note (Addendum)
Recent BNP elevated - component of CHF flare - doing better since increase in lasix x 3 days. Reviewed completing increased dose. Reviewed ok to take extra lasix tablet in the afternoons if she notices worsened pedal edema. I encouraged monitoring daily weights for changes with increased lasix dose by 1 tablet if weight gain of >3 lbs/day.

## 2019-07-18 NOTE — Progress Notes (Signed)
This visit was conducted in person.  BP 128/62 (BP Location: Left Arm, Patient Position: Sitting, Cuff Size: Normal)   Pulse (!) 108   Temp 97.6 F (36.4 C) (Temporal)   Ht 4\' 9"  (1.448 m)   Wt 143 lb 4 oz (65 kg)   SpO2 96%   BMI 31.00 kg/m    CC: ER f/u visit  Subjective:    [name], female    DOB: 1928/03/16, 86 y.o.   MRN: [id number]  HPI: [name] is a 86 y.o. female presenting on 07/18/2019 for Hospitalization Follow-up (Seen at [facility] ED 07/16/19.  Pt accompanied by friend/driver, Jerrye Beavers- temp 74.0.)   See prior note for details. Seen at ER Tuesday with ongoing R>L pedal edema, pain. D dimer elevated, venous US returned normal. CTA reassuringly negative for blood clot. Thought CHF flare, treated with increased lasix dose x 3 days - she has been taking lasix 40mg  in am and 20mg  in pm along extra potassium tablet daily and with her keflex antibiotic.   Overall doing well, notes improved leg pain and swelling since lasix dose increase. Ongoing confusion with medication management.      Relevant past medical, surgical, family and social history reviewed and updated as indicated. Interim medical history since our last visit reviewed. Allergies and medications reviewed and updated. Outpatient Medications Prior to Visit  Medication Sig Dispense Refill  . acetaminophen (TYLENOL) 500 MG tablet Take 500 mg by mouth every 6 (six) hours as needed for mild pain.    Marland Kitchen ALPRAZolam (XANAX) 1 MG tablet Take 0.5 tablets (0.5 mg total) by mouth 4 (four) times daily as needed for anxiety. 30 tablet 0  . apixaban (ELIQUIS) 5 MG TABS tablet Take 1 tablet (5 mg total) by mouth 2 (two) times daily. 60 tablet 11  . B Complex-C (B COMPLEX-VITAMIN C PO) Take by mouth daily. +zinc    . cephALEXin (KEFLEX) 500 MG capsule Take 1 capsule (500 mg total) by mouth 3 (three) times daily. 21 capsule 0  . diclofenac sodium (VOLTAREN) 1 % GEL Apply 1 application topically 3 (three) times  daily. 1 Tube 1  . diltiazem (DILACOR XR) 120 MG 24 hr capsule Take 1 capsule (120 mg total) by mouth daily. 30 capsule 6  . furosemide (LASIX) 20 MG tablet TAKE 2 TABLETS BY MOUTH EVERY DAY 60 tablet 3  . glucosamine-chondroitin 500-400 MG tablet Take 1 tablet by mouth daily.     Marland Kitchen glucose blood (ONETOUCH VERIO) test strip USE TO TEST BLOOD SUGAR ONCE DAILY. DX:E11.9 100 each 6  . HYDROcodone-acetaminophen (NORCO/VICODIN) 5-325 MG tablet Take 0.5 tablets by mouth every 6 (six) hours as needed for moderate pain. 30 tablet 0  . loperamide (IMODIUM) 2 MG capsule Take 2 mg by mouth 4 (four) times daily as needed for diarrhea or loose stools.    Marland Kitchen loratadine (CLARITIN) 10 MG tablet Take 10 mg by mouth daily as needed for allergies.    . metoprolol tartrate (LOPRESSOR) 50 MG tablet Take 75 mg by mouth 2 (two) times daily. Takes 1.5 tablets 2 times a day    . Multiple Vitamins-Minerals (ICAPS) CAPS Take 1 capsule by mouth daily.    . nitroGLYCERIN (NITROSTAT) 0.4 MG SL tablet Place 1 tablet (0.4 mg total) under the tongue every 5 (five) minutes as needed for chest pain. 25 tablet 3  . nystatin cream (MYCOSTATIN) Apply 1 application topically 2 (two) times daily. 45 g 0  . OXcarbazepine (  TRILEPTAL) 150 MG tablet Take 150 mg by mouth daily. Every evening    . pantoprazole (PROTONIX) 40 MG tablet TAKE 1 TABLET BY MOUTH TWICE A DAY 180 tablet 2  . pioglitazone (ACTOS) 30 MG tablet TAKE 1 TABLET BY MOUTH EVERY DAY 30 tablet 11  . polyethylene glycol powder (GLYCOLAX/MIRALAX) powder Take 17 g by mouth daily. Hold for diarrhea 3350 g 1  . potassium chloride (MICRO-K) 10 MEQ CR capsule Take 2 capsules (20 mEq total) by mouth daily for 3 days. 6 capsule 0  . pravastatin (PRAVACHOL) 20 MG tablet TAKE 1 TABLET BY MOUTH EVERY DAY 90 tablet 1  . Probiotic Product (ALIGN) 4 MG CAPS Take 1 capsule (4 mg total) by mouth daily.    . sitaGLIPtin (JANUVIA) 100 MG tablet Take 1/2 tablet by mouth once daily. 30 tablet 2  .  traZODone (DESYREL) 150 MG tablet Take 150 mg by mouth at bedtime.     . vitamin E 1000 UNIT capsule Take 1,000 Units by mouth daily.     No facility-administered medications prior to visit.     Per HPI unless specifically indicated in ROS section below Review of Systems Objective:    BP 128/62 (BP Location: Left Arm, Patient Position: Sitting, Cuff Size: Normal)   Pulse (!) 108   Temp 97.6 F (36.4 C) (Temporal)   Ht 4\' 9"  (1.448 m)   Wt 143 lb 4 oz (65 kg)   SpO2 96%   BMI 31.00 kg/m   Wt Readings from Last 3 Encounters:  07/18/19 143 lb 4 oz (65 kg)  07/16/19 147 lb 0.8 oz (66.7 kg)  07/15/19 147 lb (66.7 kg)    Physical Exam Vitals and nursing note reviewed.  Constitutional:      Appearance: Normal appearance. She is not ill-appearing.  HENT:     Right Ear: Decreased hearing noted.     Left Ear: Decreased hearing noted.  Musculoskeletal:        General: Swelling and tenderness present. Normal range of motion.     Right lower leg: Edema present.     Left lower leg: No edema.     Comments: Improved R pedal edema and tenderness, knee high compression sock remains in place  Skin:    General: Skin is warm and dry.     Findings: No erythema or rash.  Neurological:     Mental Status: She is alert.  Psychiatric:        Mood and Affect: Mood is anxious.        Behavior: Behavior normal.       Results for orders placed or performed during the hospital encounter of 07/16/19  Lactic acid, plasma  Result Value Ref Range   Lactic Acid, Venous 1.9 0.5 - 1.9 mmol/L  Comprehensive metabolic panel  Result Value Ref Range   Sodium 143 135 - 145 mmol/L   Potassium 3.5 3.5 - 5.1 mmol/L   Chloride 103 98 - 111 mmol/L   CO2 29 22 - 32 mmol/L   Glucose, Bld 254 (H) 70 - 99 mg/dL   BUN 29 (H) 8 - 23 mg/dL   Creatinine, Ser 1.02 (H) 0.44 - 1.00 mg/dL   Calcium 9.2 8.9 - 10.3 mg/dL   Total Protein 7.2 6.5 - 8.1 g/dL   Albumin 3.7 3.5 - 5.0 g/dL   AST 26 15 - 41 U/L   ALT 19 0  - 44 U/L   Alkaline Phosphatase 94 38 - 126 U/L  Total Bilirubin 1.0 0.3 - 1.2 mg/dL   GFR calc non Af Amer 48 (L) >60 mL/min   GFR calc Af Amer 56 (L) >60 mL/min   Anion gap 11 5 - 15  CBC with Differential  Result Value Ref Range   WBC 9.7 4.0 - 10.5 K/uL   RBC 4.33 3.87 - 5.11 MIL/uL   Hemoglobin 12.9 12.0 - 15.0 g/dL   HCT 40.8 36.0 - 46.0 %   MCV 94.2 80.0 - 100.0 fL   MCH 29.8 26.0 - 34.0 pg   MCHC 31.6 30.0 - 36.0 g/dL   RDW 14.0 11.5 - 15.5 %   Platelets 212 150 - 400 K/uL   nRBC 0.0 0.0 - 0.2 %   Neutrophils Relative % 81 %   Neutro Abs 7.9 (H) 1.7 - 7.7 K/uL   Lymphocytes Relative 10 %   Lymphs Abs 0.9 0.7 - 4.0 K/uL   Monocytes Relative 7 %   Monocytes Absolute 0.7 0.1 - 1.0 K/uL   Eosinophils Relative 1 %   Eosinophils Absolute 0.1 0.0 - 0.5 K/uL   Basophils Relative 1 %   Basophils Absolute 0.1 0.0 - 0.1 K/uL   Immature Granulocytes 0 %   Abs Immature Granulocytes 0.04 0.00 - 0.07 K/uL  Urinalysis, Complete w Microscopic  Result Value Ref Range   Color, Urine YELLOW (A) YELLOW   APPearance HAZY (A) CLEAR   Specific Gravity, Urine 1.017 1.005 - 1.030   pH 6.0 5.0 - 8.0   Glucose, UA NEGATIVE NEGATIVE mg/dL   Hgb urine dipstick NEGATIVE NEGATIVE   Bilirubin Urine NEGATIVE NEGATIVE   Ketones, ur NEGATIVE NEGATIVE mg/dL   Protein, ur NEGATIVE NEGATIVE mg/dL   Nitrite NEGATIVE NEGATIVE   Leukocytes,Ua NEGATIVE NEGATIVE   RBC / HPF 0-5 0 - 5 RBC/hpf   WBC, UA 0-5 0 - 5 WBC/hpf   Bacteria, UA RARE (A) NONE SEEN   Squamous Epithelial / LPF 0-5 0 - 5   Mucus PRESENT    Hyaline Casts, UA PRESENT   Brain natriuretic peptide  Result Value Ref Range   B Natriuretic Peptide 430.0 (H) 0.0 - 100.0 pg/mL  Troponin I (High Sensitivity)  Result Value Ref Range   Troponin I (High Sensitivity) 11 <18 ng/L   *Note: Due to a large number of results and/or encounters for the requested time period, some results have not been displayed. A complete set of results can be  found in Results Review.   Assessment & Plan:  This visit occurred during the SARS-CoV-2 public health emergency.  Safety protocols were in place, including screening questions prior to the visit, additional usage of staff PPE, and extensive cleaning of exam room while observing appropriate contact time as indicated for disinfecting solutions.   Problem List Items Addressed This Visit    Varicose veins of right lower extremity with inflammation    Improved R lateral leg venous inflammation after keflex and improved pedal edema with increased lasix dose x 3 days. Continue to monitor. She also continues eliquis.       Pain and swelling of right lower leg - Primary    Recent RLE venous US and chest CTA returned normal.       Chronic venous insufficiency    Continue compression stocking, discussed lasix use.       (HFpEF) heart failure with preserved ejection fraction (HCC)    Recent BNP elevated - component of CHF flare - doing better since increase in lasix x 3  days. Reviewed completing increased dose. Reviewed ok to take extra lasix tablet in the afternoons if she notices worsened pedal edema. I encouraged monitoring daily weights for changes with increased lasix dose by 1 tablet if weight gain of >3 lbs/day.           No orders of the defined types were placed in this encounter.  No orders of the defined types were placed in this encounter.   Patient Instructions  Finish green capsule antibiotic (2 more pills). Finish blue capsule potassium (2 more pills) Leg is doing better - less red, less painful, less swelling.  You may take an extra water pill furosemide (lasix) if you notice leg swelling returning - like you've taken over the last 3 days.  Restart daily weights - and may also take extra water pill if you notice weight gain (>3 lbs in 1 day).  Keep follow up in 2 weeks.    Follow up plan: Return in about 2 weeks (around 08/01/2019), or if symptoms worsen or fail to improve,  for follow up visit.  Ria Bush, MD

## 2019-07-18 NOTE — Assessment & Plan Note (Signed)
Continue compression stocking, discussed lasix use.

## 2019-07-18 NOTE — Assessment & Plan Note (Signed)
Improved R lateral leg venous inflammation after keflex and improved pedal edema with increased lasix dose x 3 days. Continue to monitor. She also continues eliquis.

## 2019-07-18 NOTE — Patient Instructions (Addendum)
Finish green capsule antibiotic (2 more pills). Finish blue capsule potassium (2 more pills) Leg is doing better - less red, less painful, less swelling.  You may take an extra water pill furosemide (lasix) if you notice leg swelling returning - like you've taken over the last 3 days.  Restart daily weights - and may also take extra water pill if you notice weight gain (>3 lbs in 1 day).  Keep follow up in 2 weeks.

## 2019-07-24 ENCOUNTER — Telehealth: Payer: Self-pay | Admitting: Family Medicine

## 2019-07-24 NOTE — Progress Notes (Signed)
  Chronic Care Management   Outreach Note  07/24/2019 Name: [name] MRN: [id number] DOB: February 26, 1928  Referred by: Ria Bush, MD Reason for referral : No chief complaint on file.   An unsuccessful telephone outreach was attempted today. The patient was referred to the pharmacist for assistance with care management and care coordination.   Follow Up Plan:    [name] [employer] Scheduler

## 2019-07-25 ENCOUNTER — Telehealth: Payer: Self-pay | Admitting: Family Medicine

## 2019-07-25 NOTE — Telephone Encounter (Signed)
Noted. Thank you. Agree with [name]'s triage note. Will see tomorrow.

## 2019-07-25 NOTE — Telephone Encounter (Signed)
[facility] - Client TELEPHONE ADVICE RECORD [facility] Patient Name: [name] Gender: Female DOB: [birth year] Age: 86 Y 48 M 17 D Return Phone Number: [phone] (Primary) Address: [place] City/State/Zip: [place] [address] Client [facility] - Client Client Site [facility] Physician Ria Bush - MD Contact Type Call Who Is Calling Patient / Member / Family / Caregiver Call Type Triage / Clinical Relationship To Patient Self Return Phone Number [phone] (Primary) Chief Complaint Weakness, Generalized Reason for Call Symptomatic / Request for [place] has high blood pressure and feels bad. Translation No Nurse Assessment Nurse: Hardin Negus, RN, Danae Chen Date/Time Eilene Ghazi Time): 07/25/2019 11:09:58 AM Confirm and document reason for call. If symptomatic, describe symptoms. ---Caller states that she has been sick x2 days and her BP is elevated. States she just wants to go to sleep. States she feels pretty bad. States has appt. with PCP on Tuesday at 2pm. Most recent BP reading 150/99. States she did not take any of her meds last night. No known fever. Slight headache. Has the patient had close contact with a person known or suspected to have the novel coronavirus illness OR traveled / lives in area with major community spread (including international travel) in the last 14 days from the onset of symptoms? * If Asymptomatic, screen for exposure and travel within the last 14 days. ---No Does the patient have any new or worsening symptoms? ---Yes Will a triage be completed? ---Yes Related visit to physician within the last 2 weeks? ---No Does the PT have any chronic conditions? (i.e. diabetes, asthma, this includes High risk factors for pregnancy, etc.) ---Yes List chronic conditions. ---diabetic- oral meds HTN- medicated arrhythmia Is this a  behavioral health or substance abuse call? ---No Guidelines Guideline Title Affirmed Question Affirmed Notes Nurse Date/Time (Eastern Time) Heart Rate and Heartbeat Questions Dizziness, lightheadedness, or weakness Hardin Negus, RN, Danae Chen 07/25/2019 11:20:12 AM Disp. Time Eilene Ghazi Time) Disposition Final User 07/25/2019 10:39:33 AM Attempt made - message left Hardin Negus, RN, Danae Chen PLEASE NOTE: All timestamps contained within this report are represented as [place] Standard Time. CONFIDENTIALTY NOTICE: This fax transmission is intended only for the addressee. It contains information that is legally privileged, confidential or otherwise protected from use or disclosure. If you are not the intended recipient, you are strictly prohibited from reviewing, disclosing, copying using or disseminating any of this information or taking any action in reliance on or regarding this information. If you have received this fax in error, please notify us immediately by telephone so that we can arrange for its return to [place]. Phone: [phone], Toll-Free: [phone], Fax: [phone] Page: 2 of 2 Call Id: [id number] [place]. Time Eilene Ghazi Time) Disposition Final User 07/25/2019 10:54:06 AM Attempt made - no message left Oswald Hillock, Danae Chen 07/25/2019 11:29:08 AM Go to ED Now Yes Hardin Negus, RN, Dierdre Highman Disagree/Comply Disagree Caller Understands Yes PreDisposition InappropriateToAsk Care Advice Given Per Guideline GO TO ED NOW: * You need to be seen in the Emergency Department. * Go to the ED at ___________ [place] now. Drive carefully. NOTE TO TRIAGER - DRIVING: * Another adult should drive. ANOTHER ADULT SHOULD DRIVE: CARE ADVICE given per Heart Rate and Heartbeat Questions (Adult) guideline. Comments User: Ferdinand Cava, RN Date/Time Eilene Ghazi Time): 07/25/2019 10:39:15 AM Caller confirmed first/last name and dob- states she had incontinence and needs to go to restroom and change her clothes- told me  to call  her back in a few minutes and disconnected User: Ferdinand Cava, RN Date/Time Eilene Ghazi Time): 07/25/2019 11:14:37 AM states her heart is out of rhythm and the doctor started her on medications User: Ferdinand Cava, RN Date/Time Eilene Ghazi Time): 07/25/2019 11:28:20 AM caller states she was in ED 5 hours last week and refuses to go back to ED or an UC- states she just wants to go to sleep- encouraged her to go and to call office if refuses to go Referrals [facility] REFUSED

## 2019-07-25 NOTE — Telephone Encounter (Addendum)
Pt said that access nurse advised pt she needs to go to ED for eval and pt refuses to go to ED. Pt said she always has CP,SOB,H/A and dizziness and weakness in arms and legs. Pt said starting 07/24/19 pt was not as alert as usual. Pt tried to eat cereal on 07/24/19 and spilled it all over her like she could not get it in her mouth; today pt is more alert;pt was able to eat her cereal this morning. Pt did not take any med on 07/24/19 because she could not eat and she was not alert; pt has just eaten and does not usually take her morning med until 12 noon; pt will go ahead and take morning med now and pt will take evening meds as usual; pt was advised to drink plenty of water today;pt voiced understanding; pt still refuses to go to ED. Pt said the friend that took her BP earlier today and was 150/99 is not there now and pt does not have a way to take her BP.pt is walking with walker and is afraid she is going to fall; advised pt to be careful and not fall and pt voiced understanding. Do not hear any change in pts speech. Dr Darnell Level was advised of above info and said if pt refuses ED schedule appt with him on 07/26/19 and if condition changes or worsens to go to ED. Pt notified as instructed and pt said she cannot schedule an appt with Dr Darnell Level tomorrow because he does not work on Saturdays. I advised pt tomorrow was Friday. Pt said no it is Saturday. I asked pt if she would go to ED for eval and she said no; pt asked her gentleman friend what tomorrow was and he told her Fri. Pt said OK make appt with Dr Darnell Level for Fri. Pt scheduled 30' in office appt with Dr Darnell Level 07/26/19 at 12 noon. reenforced with pt if condition worsens to go to ED. Pt voiced understanding. FYI to Dr Darnell Level.Pt has no covid symptoms per pt, no travel and no known exposure to + covid.

## 2019-07-25 NOTE — Telephone Encounter (Signed)
Pt called and said she didn't take her medicine last night when she took blood pressure it was 150/99. She said she feels really really really bad. I transferred her to access nurse.

## 2019-07-26 ENCOUNTER — Other Ambulatory Visit: Payer: Self-pay

## 2019-07-26 ENCOUNTER — Encounter: Payer: Self-pay | Admitting: Family Medicine

## 2019-07-26 ENCOUNTER — Ambulatory Visit (INDEPENDENT_AMBULATORY_CARE_PROVIDER_SITE_OTHER): Payer: Medicare Other | Admitting: Family Medicine

## 2019-07-26 VITALS — BP 122/64 | HR 97 | Temp 97.5°F | Ht <= 58 in | Wt 139.1 lb

## 2019-07-26 DIAGNOSIS — M79605 Pain in left leg: Secondary | ICD-10-CM | POA: Diagnosis not present

## 2019-07-26 DIAGNOSIS — I10 Essential (primary) hypertension: Secondary | ICD-10-CM

## 2019-07-26 DIAGNOSIS — Z79899 Other long term (current) drug therapy: Secondary | ICD-10-CM | POA: Diagnosis not present

## 2019-07-26 DIAGNOSIS — R5383 Other fatigue: Secondary | ICD-10-CM | POA: Diagnosis not present

## 2019-07-26 DIAGNOSIS — I480 Paroxysmal atrial fibrillation: Secondary | ICD-10-CM | POA: Diagnosis not present

## 2019-07-26 DIAGNOSIS — G8929 Other chronic pain: Secondary | ICD-10-CM

## 2019-07-26 DIAGNOSIS — I872 Venous insufficiency (chronic) (peripheral): Secondary | ICD-10-CM

## 2019-07-26 DIAGNOSIS — M79604 Pain in right leg: Secondary | ICD-10-CM | POA: Diagnosis not present

## 2019-07-26 DIAGNOSIS — I25119 Atherosclerotic heart disease of native coronary artery with unspecified angina pectoris: Secondary | ICD-10-CM | POA: Diagnosis not present

## 2019-07-26 DIAGNOSIS — R519 Headache, unspecified: Secondary | ICD-10-CM

## 2019-07-26 NOTE — Patient Instructions (Addendum)
Bring in all your medicines to each office visit . Touch base with [name] about trazodone dosing - you may benefit from lower dose at night time.  Try to back down on hydrocodone to 1/2 tablet once daily - see if pain is just as well controlled.  I'd like to check head CT to further evaluate sleepiness and slurred speech. See our referral coordinator to schedule this for next week.  Keep next week's appointment.

## 2019-07-26 NOTE — Progress Notes (Signed)
This visit was conducted in person.  BP 122/64 (BP Location: Left Arm, Patient Position: Sitting, Cuff Size: Normal)   Pulse 97   Temp (!) 97.5 F (36.4 C) (Temporal)   Ht 4\' 9"  (1.448 m)   Wt 139 lb 2 oz (63.1 kg)   SpO2 96%   BMI 30.11 kg/m    CC: recent concerns over lethargy/somnolence Subjective:    [name], female    DOB: 1927/06/20, 86 y.o.   MRN: [id number]  HPI: [name] is a 86 y.o. female presenting on 07/26/2019 for Hypertension (C/o recent higher than normal BP readings, HA, dizziness, severe sleepiness all day and very lethargic- especially in mornings.  Family thinks it may be med related.  Pt accompanined by frieind/driver, Jerrye Beavers- temp 03.5. )   See recent phone note. Elevated BP reading one day this week in setting of missing her BP med the previous night.   Family notes increased lethargy, some slurred speech "for weeks". Patient has noted some days where she has trouble eating cereal in the morning (spills it on herself), trouble holding a cup of coffee due to weakness. Notes increased unsteadiness despite regularly using walker. Staying "weak" and very sleepy.   No recent med changes.  No headaches or vision changes, chest pain. No unilateral numbness or weakness.   She states she takes trazodone 150mg  1/2 tab daily, oxcarbazepine 150mg  nightly, and xanax 1mg  1/2 tab QID PRN.  Endorses "the usual" exertional dyspnea.  Also daily headaches.      Relevant past medical, surgical, family and social history reviewed and updated as indicated. Interim medical history since our last visit reviewed. Allergies and medications reviewed and updated. Outpatient Medications Prior to Visit  Medication Sig Dispense Refill  . acetaminophen (TYLENOL) 500 MG tablet Take 500 mg by mouth every 6 (six) hours as needed for mild pain.    Marland Kitchen ALPRAZolam (XANAX) 1 MG tablet Take 0.5 tablets (0.5 mg total) by mouth 4 (four) times daily as needed for anxiety.     Marland Kitchen apixaban (ELIQUIS) 5 MG TABS tablet Take 1 tablet (5 mg total) by mouth 2 (two) times daily. 60 tablet 11  . B Complex-C (B COMPLEX-VITAMIN C PO) Take by mouth daily. +zinc    . diclofenac sodium (VOLTAREN) 1 % GEL Apply 1 application topically 3 (three) times daily. 1 Tube 1  . diltiazem (DILACOR XR) 120 MG 24 hr capsule Take 1 capsule (120 mg total) by mouth daily. 30 capsule 6  . furosemide (LASIX) 20 MG tablet TAKE 2 TABLETS BY MOUTH EVERY DAY 60 tablet 3  . glucosamine-chondroitin 500-400 MG tablet Take 1 tablet by mouth daily.     Marland Kitchen glucose blood (ONETOUCH VERIO) test strip USE TO TEST BLOOD SUGAR ONCE DAILY. DX:E11.9 100 each 6  . HYDROcodone-acetaminophen (NORCO/VICODIN) 5-325 MG tablet Take 0.5 tablets by mouth every 6 (six) hours as needed for moderate pain. 30 tablet 0  . loperamide (IMODIUM) 2 MG capsule Take 2 mg by mouth 4 (four) times daily as needed for diarrhea or loose stools.    Marland Kitchen loratadine (CLARITIN) 10 MG tablet Take 10 mg by mouth daily as needed for allergies.    . metoprolol tartrate (LOPRESSOR) 50 MG tablet Take 75 mg by mouth 2 (two) times daily. Takes 1.5 tablets 2 times a day    . Multiple Vitamins-Minerals (ICAPS) CAPS Take 1 capsule by mouth daily.    . nitroGLYCERIN (NITROSTAT) 0.4 MG SL tablet Place 1  tablet (0.4 mg total) under the tongue every 5 (five) minutes as needed for chest pain. 25 tablet 3  . nystatin cream (MYCOSTATIN) Apply 1 application topically 2 (two) times daily. 45 g 0  . OXcarbazepine (TRILEPTAL) 150 MG tablet Take 150 mg by mouth daily. Every evening    . pantoprazole (PROTONIX) 40 MG tablet TAKE 1 TABLET BY MOUTH TWICE A DAY 180 tablet 2  . pioglitazone (ACTOS) 30 MG tablet TAKE 1 TABLET BY MOUTH EVERY DAY 30 tablet 11  . polyethylene glycol powder (GLYCOLAX/MIRALAX) powder Take 17 g by mouth daily. Hold for diarrhea 3350 g 1  . pravastatin (PRAVACHOL) 20 MG tablet TAKE 1 TABLET BY MOUTH EVERY DAY 90 tablet 1  . Probiotic Product (ALIGN)  4 MG CAPS Take 1 capsule (4 mg total) by mouth daily.    . sitaGLIPtin (JANUVIA) 100 MG tablet Take 1/2 tablet by mouth once daily. 30 tablet 2  . traZODone (DESYREL) 150 MG tablet Take 150 mg by mouth at bedtime.     . vitamin E 1000 UNIT capsule Take 1,000 Units by mouth daily.    Marland Kitchen ALPRAZolam (XANAX) 1 MG tablet Take 0.5 tablets (0.5 mg total) by mouth 4 (four) times daily as needed for anxiety. 30 tablet 0  . cephALEXin (KEFLEX) 500 MG capsule Take 1 capsule (500 mg total) by mouth 3 (three) times daily. 21 capsule 0  . potassium chloride (MICRO-K) 10 MEQ CR capsule Take 2 capsules (20 mEq total) by mouth daily for 3 days. 6 capsule 0   No facility-administered medications prior to visit.     Per HPI unless specifically indicated in ROS section below Review of Systems Objective:    BP 122/64 (BP Location: Left Arm, Patient Position: Sitting, Cuff Size: Normal)   Pulse 97   Temp (!) 97.5 F (36.4 C) (Temporal)   Ht 4\' 9"  (1.448 m)   Wt 139 lb 2 oz (63.1 kg)   SpO2 96%   BMI 30.11 kg/m   Wt Readings from Last 3 Encounters:  07/26/19 139 lb 2 oz (63.1 kg)  07/18/19 143 lb 4 oz (65 kg)  07/16/19 147 lb 0.8 oz (66.7 kg)    Physical Exam Vitals and nursing note reviewed.  Constitutional:      General: She is not in acute distress.    Appearance: Normal appearance. She is not ill-appearing.     Comments: Tired and sleepy appearing, ambulates with walker  HENT:     Head: Normocephalic and atraumatic.     Mouth/Throat:     Mouth: Mucous membranes are moist.     Pharynx: Oropharynx is clear. No oropharyngeal exudate or posterior oropharyngeal erythema.  Eyes:     Extraocular Movements: Extraocular movements intact.     Pupils: Pupils are equal, round, and reactive to light.  Cardiovascular:     Rate and Rhythm: Normal rate. Rhythm irregular.     Pulses: Normal pulses.     Heart sounds: Normal heart sounds. No murmur.  Pulmonary:     Effort: Pulmonary effort is normal. No  respiratory distress.     Breath sounds: Normal breath sounds. No wheezing, rhonchi or rales.  Musculoskeletal:     Comments: Compression stockings in place  Neurological:     Mental Status: She is alert.     Cranial Nerves: Cranial nerves are intact.     Sensory: Sensation is intact.     Motor: Motor function is intact.     Comments:  CN 2-12  intact FTN intact EOMI Unsteady gait with walker  Psychiatric:        Mood and Affect: Mood normal.        Behavior: Behavior normal.       Results for orders placed or performed during the hospital encounter of 07/16/19  Lactic acid, plasma  Result Value Ref Range   Lactic Acid, Venous 1.9 0.5 - 1.9 mmol/L  Comprehensive metabolic panel  Result Value Ref Range   Sodium 143 135 - 145 mmol/L   Potassium 3.5 3.5 - 5.1 mmol/L   Chloride 103 98 - 111 mmol/L   CO2 29 22 - 32 mmol/L   Glucose, Bld 254 (H) 70 - 99 mg/dL   BUN 29 (H) 8 - 23 mg/dL   Creatinine, Ser 1.02 (H) 0.44 - 1.00 mg/dL   Calcium 9.2 8.9 - 10.3 mg/dL   Total Protein 7.2 6.5 - 8.1 g/dL   Albumin 3.7 3.5 - 5.0 g/dL   AST 26 15 - 41 U/L   ALT 19 0 - 44 U/L   Alkaline Phosphatase 94 38 - 126 U/L   Total Bilirubin 1.0 0.3 - 1.2 mg/dL   GFR calc non Af Amer 48 (L) >60 mL/min   GFR calc Af Amer 56 (L) >60 mL/min   Anion gap 11 5 - 15  CBC with Differential  Result Value Ref Range   WBC 9.7 4.0 - 10.5 K/uL   RBC 4.33 3.87 - 5.11 MIL/uL   Hemoglobin 12.9 12.0 - 15.0 g/dL   HCT 40.8 36.0 - 46.0 %   MCV 94.2 80.0 - 100.0 fL   MCH 29.8 26.0 - 34.0 pg   MCHC 31.6 30.0 - 36.0 g/dL   RDW 14.0 11.5 - 15.5 %   Platelets 212 150 - 400 K/uL   nRBC 0.0 0.0 - 0.2 %   Neutrophils Relative % 81 %   Neutro Abs 7.9 (H) 1.7 - 7.7 K/uL   Lymphocytes Relative 10 %   Lymphs Abs 0.9 0.7 - 4.0 K/uL   Monocytes Relative 7 %   Monocytes Absolute 0.7 0.1 - 1.0 K/uL   Eosinophils Relative 1 %   Eosinophils Absolute 0.1 0.0 - 0.5 K/uL   Basophils Relative 1 %   Basophils Absolute 0.1  0.0 - 0.1 K/uL   Immature Granulocytes 0 %   Abs Immature Granulocytes 0.04 0.00 - 0.07 K/uL  Urinalysis, Complete w Microscopic  Result Value Ref Range   Color, Urine YELLOW (A) YELLOW   APPearance HAZY (A) CLEAR   Specific Gravity, Urine 1.017 1.005 - 1.030   pH 6.0 5.0 - 8.0   Glucose, UA NEGATIVE NEGATIVE mg/dL   Hgb urine dipstick NEGATIVE NEGATIVE   Bilirubin Urine NEGATIVE NEGATIVE   Ketones, ur NEGATIVE NEGATIVE mg/dL   Protein, ur NEGATIVE NEGATIVE mg/dL   Nitrite NEGATIVE NEGATIVE   Leukocytes,Ua NEGATIVE NEGATIVE   RBC / HPF 0-5 0 - 5 RBC/hpf   WBC, UA 0-5 0 - 5 WBC/hpf   Bacteria, UA RARE (A) NONE SEEN   Squamous Epithelial / LPF 0-5 0 - 5   Mucus PRESENT    Hyaline Casts, UA PRESENT   Brain natriuretic peptide  Result Value Ref Range   B Natriuretic Peptide 430.0 (H) 0.0 - 100.0 pg/mL  Troponin I (High Sensitivity)  Result Value Ref Range   Troponin I (High Sensitivity) 11 <18 ng/L   *Note: Due to a large number of results and/or encounters for the requested time period, some results  have not been displayed. A complete set of results can be found in Results Review.   Assessment & Plan:  This visit occurred during the SARS-CoV-2 public health emergency.  Safety protocols were in place, including screening questions prior to the visit, additional usage of staff PPE, and extensive cleaning of exam room while observing appropriate contact time as indicated for disinfecting solutions.   Problem List Items Addressed This Visit    Polypharmacy    HH previously involved.  I have asked her to bring all meds to each appointment. I have previously touched base with pharmacy to clarify what she is filling.  I will touch base with psych about psych meds      Paroxysmal atrial fibrillation (HCC)    Metoprolol was increased to 75mg  BID last month. Also on eliquis.  Irregular today but rate controlled. ?contribution to worsening fatigue. See below.       Lethargy - Primary     Reviewed recent stable labs 10 days ago as well as possible contribution of medications to current concerns, reviewed changing pharmacodynamics of aging body. Some ongoing medication confusion - I asked her to bring all meds to each OV. [name] endorses taking 1/2 tab trazodone 150mg  nightly as well as continued oxcarbazepine and xanax. I told patient I would talk with her psychiatrist to review med regimen. I also asked she try diminishing hydrocodone use to once daily.  ?recent metoprolol changes contributing to lethargy - consider change in regimen.  Given worsening lethargy recently and cerebrovascular risk factors present prudent to update head imaging with CT scan.  ADDENDUM ==> After the visit, I called her psychiatrist [name] (([phone]) and spoke with her care coordinator to review meds - she states patient should be off oxcarbazepine and only on trazodone/xanax. I asked her to communicate with [name] concerns over recent oversedation to see if lower trazodone dose may be indicated.       Hypertension    Stable period. Recent elevated reading likely due to missing prior night dose.       Encounter for chronic pain management    [place] CSRS reviewed Prior pain regimen was 1/2 hydrocodone QID, now down to BID - I suggested trial once daily dosing 1/2 hydrocodone       Chronic venous insufficiency   Chronic leg pain    Reviewed anticipate chronic leg pain from known chronic venous insufficiency and congestion that we will continue to manage with compression stockings, pain med PRN, lasix and eliquis.        Other Visit Diagnoses    Acute nonintractable headache, unspecified headache type       Relevant Orders   CT Head Wo Contrast       No orders of the defined types were placed in this encounter.  Orders Placed This Encounter  Procedures  . CT Head Wo Contrast    Standing Status:   Future    Standing Expiration Date:   10/24/2020    Order Specific Question:   **  REASON FOR EXAM (FREE TEXT)    Answer:   HA ,slurred speech, lethargy for weeks    Order Specific Question:   Preferred imaging location?    Answer:   [facility]    Order Specific Question:   Radiology Contrast Protocol - do NOT remove file path    Answer:   [email or web address]    Patient Instructions  Bring in all your medicines to each office visit . Touch base with [name]  about trazodone dosing - you may benefit from lower dose at night time.  Try to back down on hydrocodone to 1/2 tablet once daily - see if pain is just as well controlled.  I'd like to check head CT to further evaluate sleepiness and slurred speech. See our referral coordinator to schedule this for next week.  Keep next week's appointment.    Follow up plan: I asked her to keep next Tuesday's follow up - and we will try to schedule head CT for Monday.   Ria Bush, MD

## 2019-07-27 DIAGNOSIS — Z79899 Other long term (current) drug therapy: Secondary | ICD-10-CM | POA: Insufficient documentation

## 2019-07-27 DIAGNOSIS — R5383 Other fatigue: Secondary | ICD-10-CM | POA: Insufficient documentation

## 2019-07-27 HISTORY — DX: Other fatigue: R53.83

## 2019-07-27 NOTE — Assessment & Plan Note (Signed)
Reviewed anticipate chronic leg pain from known chronic venous insufficiency and congestion that we will continue to manage with compression stockings, pain med PRN, lasix and eliquis.

## 2019-07-27 NOTE — Assessment & Plan Note (Signed)
[place] CSRS reviewed Prior pain regimen was 1/2 hydrocodone QID, now down to BID - I suggested trial once daily dosing 1/2 hydrocodone

## 2019-07-27 NOTE — Assessment & Plan Note (Addendum)
Reviewed recent stable labs 10 days ago as well as possible contribution of medications to current concerns, reviewed changing pharmacodynamics of aging body. Some ongoing medication confusion - I asked her to bring all meds to each OV. Mattalynn endorses taking 1/2 tab trazodone 150mg  nightly as well as continued oxcarbazepine and xanax. I told patient I would talk with her psychiatrist to review med regimen. I also asked she try diminishing hydrocodone use to once daily.  ?recent metoprolol changes contributing to lethargy - consider change in regimen.  Given worsening lethargy recently and cerebrovascular risk factors present prudent to update head imaging with CT scan.  ADDENDUM ==> After the visit, I called her psychiatrist Josephine Igo [phone]) and spoke with her care coordinator to review meds - she states patient should be off oxcarbazepine and only on trazodone/xanax. I asked her to communicate with Lattie Haw concerns over recent oversedation to see if lower trazodone dose may be indicated.

## 2019-07-27 NOTE — Assessment & Plan Note (Signed)
HH previously involved.  I have asked her to bring all meds to each appointment. I have previously touched base with pharmacy to clarify what she is filling.  I will touch base with psych about psych meds

## 2019-07-27 NOTE — Assessment & Plan Note (Signed)
Stable period. Recent elevated reading likely due to missing prior night dose.

## 2019-07-27 NOTE — Assessment & Plan Note (Addendum)
Metoprolol was increased to 75mg  BID last month. Also on eliquis.  Irregular today but rate controlled. ?contribution to worsening fatigue. See below.

## 2019-07-30 ENCOUNTER — Ambulatory Visit (INDEPENDENT_AMBULATORY_CARE_PROVIDER_SITE_OTHER): Payer: Medicare Other | Admitting: Family Medicine

## 2019-07-30 ENCOUNTER — Other Ambulatory Visit: Payer: Self-pay

## 2019-07-30 ENCOUNTER — Encounter: Payer: Self-pay | Admitting: Family Medicine

## 2019-07-30 VITALS — BP 124/72 | HR 100 | Temp 97.6°F | Ht <= 58 in | Wt 140.4 lb

## 2019-07-30 DIAGNOSIS — M79604 Pain in right leg: Secondary | ICD-10-CM

## 2019-07-30 DIAGNOSIS — I872 Venous insufficiency (chronic) (peripheral): Secondary | ICD-10-CM | POA: Diagnosis not present

## 2019-07-30 DIAGNOSIS — R5383 Other fatigue: Secondary | ICD-10-CM | POA: Diagnosis not present

## 2019-07-30 DIAGNOSIS — I25119 Atherosclerotic heart disease of native coronary artery with unspecified angina pectoris: Secondary | ICD-10-CM

## 2019-07-30 DIAGNOSIS — Z79899 Other long term (current) drug therapy: Secondary | ICD-10-CM

## 2019-07-30 DIAGNOSIS — M79605 Pain in left leg: Secondary | ICD-10-CM

## 2019-07-30 DIAGNOSIS — I5033 Acute on chronic diastolic (congestive) heart failure: Secondary | ICD-10-CM | POA: Diagnosis not present

## 2019-07-30 DIAGNOSIS — G8929 Other chronic pain: Secondary | ICD-10-CM | POA: Diagnosis not present

## 2019-07-30 MED ORDER — FUROSEMIDE 20 MG PO TABS: 40.0000 mg | ORAL_TABLET | Freq: Every day | ORAL | 3 refills | Status: DC

## 2019-07-30 MED ORDER — FAMOTIDINE 20 MG PO TABS: 20.0000 mg | ORAL_TABLET | Freq: Every day | ORAL | Status: AC | PRN

## 2019-07-30 MED ORDER — POTASSIUM CHLORIDE ER 10 MEQ PO CPCR: 10.0000 meq | ORAL_CAPSULE | Freq: Every day | ORAL | 1 refills | Status: DC

## 2019-07-30 NOTE — Progress Notes (Signed)
This visit was conducted in person.  BP 124/72 (BP Location: Right Arm, Patient Position: Sitting, Cuff Size: Normal)   Pulse 100   Temp 97.6 F (36.4 C) (Temporal)   Ht 4\' 9"  (1.448 m)   Wt 140 lb 6 oz (63.7 kg)   SpO2 94%   BMI 30.38 kg/m    CC: f/u visit  Subjective:    [name], female    DOB: 11/25/1927, 86 y.o.   MRN: [id number]  HPI: [name] is a 86 y.o. female presenting on 07/30/2019 for Follow-up (Here for 2 wk f/u.  Pt accompanied by friend, Jerrye Beavers- temp 97.8.)   See prior note for details.  Head CT scheduled for 08/08/2019.   Much more alert and lively today.   She has continued taking oxcarbazepine. States she needs this for her irritability.   Ongoing leg pains - this is chronic. No new leg wounds. Bruising to RLE noted without noted trauma/injury.   Med rec performed Taking metoprolol 75mg  bid Taking lasix 40mg  bid (prn) See new med list for changes done today.      Relevant past medical, surgical, family and social history reviewed and updated as indicated. Interim medical history since our last visit reviewed. Allergies and medications reviewed and updated. Outpatient Medications Prior to Visit  Medication Sig Dispense Refill  . acetaminophen (TYLENOL) 500 MG tablet Take 500 mg by mouth every 6 (six) hours as needed for mild pain.    Marland Kitchen ALPRAZolam (XANAX) 1 MG tablet Take 0.5 tablets (0.5 mg total) by mouth 4 (four) times daily as needed for anxiety.    Marland Kitchen apixaban (ELIQUIS) 5 MG TABS tablet Take 1 tablet (5 mg total) by mouth 2 (two) times daily. 60 tablet 11  . B Complex-C (B COMPLEX-VITAMIN C PO) Take by mouth daily. +zinc    . diclofenac sodium (VOLTAREN) 1 % GEL Apply 1 application topically 3 (three) times daily. 1 Tube 1  . diltiazem (DILACOR XR) 120 MG 24 hr capsule Take 1 capsule (120 mg total) by mouth daily. 30 capsule 6  . glucosamine-chondroitin 500-400 MG tablet Take 1 tablet by mouth daily.     Marland Kitchen glucose blood  (ONETOUCH VERIO) test strip USE TO TEST BLOOD SUGAR ONCE DAILY. DX:E11.9 100 each 6  . HYDROcodone-acetaminophen (NORCO/VICODIN) 5-325 MG tablet Take 0.5 tablets by mouth every 6 (six) hours as needed for moderate pain. 30 tablet 0  . loperamide (IMODIUM) 2 MG capsule Take 2 mg by mouth 4 (four) times daily as needed for diarrhea or loose stools.    Marland Kitchen loratadine (CLARITIN) 10 MG tablet Take 10 mg by mouth daily as needed for allergies.    . metoprolol tartrate (LOPRESSOR) 50 MG tablet Take 75 mg by mouth 2 (two) times daily. Takes 1.5 tablets 2 times a day    . Multiple Vitamins-Minerals (ICAPS) CAPS Take 1 capsule by mouth daily.    . nitroGLYCERIN (NITROSTAT) 0.4 MG SL tablet Place 1 tablet (0.4 mg total) under the tongue every 5 (five) minutes as needed for chest pain. 25 tablet 3  . nystatin-triamcinolone ointment (MYCOLOG) Apply to affected area twice a day as needed for itching    . OXcarbazepine (TRILEPTAL) 150 MG tablet Take 150 mg by mouth daily. Every evening    . pantoprazole (PROTONIX) 40 MG tablet TAKE 1 TABLET BY MOUTH TWICE A DAY 180 tablet 2  . pioglitazone (ACTOS) 30 MG tablet TAKE 1 TABLET BY MOUTH EVERY DAY 30 tablet  11  . polyethylene glycol powder (GLYCOLAX/MIRALAX) powder Take 17 g by mouth daily. Hold for diarrhea 3350 g 1  . pravastatin (PRAVACHOL) 20 MG tablet TAKE 1 TABLET BY MOUTH EVERY DAY 90 tablet 1  . Probiotic Product (ALIGN) 4 MG CAPS Take 1 capsule (4 mg total) by mouth daily.    . sitaGLIPtin (JANUVIA) 100 MG tablet Take 1/2 tablet by mouth once daily. 30 tablet 2  . traZODone (DESYREL) 150 MG tablet Take 0.5 tablets (75 mg total) by mouth at bedtime.    . vitamin E 1000 UNIT capsule Take 1,000 Units by mouth daily.    . furosemide (LASIX) 20 MG tablet TAKE 2 TABLETS BY MOUTH EVERY DAY 60 tablet 3  . traZODone (DESYREL) 150 MG tablet Take 150 mg by mouth at bedtime.     Marland Kitchen nystatin cream (MYCOSTATIN) Apply 1 application topically 2 (two) times daily. (Patient not  taking: Reported on 07/30/2019) 45 g 0  . potassium chloride (MICRO-K) 10 MEQ CR capsule Take 2 capsules (20 mEq total) by mouth daily for 3 days. 6 capsule 0   No facility-administered medications prior to visit.     Per HPI unless specifically indicated in ROS section below Review of Systems Objective:  BP 124/72 (BP Location: Right Arm, Patient Position: Sitting, Cuff Size: Normal)   Pulse 100   Temp 97.6 F (36.4 C) (Temporal)   Ht 4\' 9"  (1.448 m)   Wt 140 lb 6 oz (63.7 kg)   SpO2 94%   BMI 30.38 kg/m   Wt Readings from Last 3 Encounters:  07/30/19 140 lb 6 oz (63.7 kg)  07/26/19 139 lb 2 oz (63.1 kg)  07/18/19 143 lb 4 oz (65 kg)      Physical Exam Vitals and nursing note reviewed.  Constitutional:      Appearance: Normal appearance. She is not ill-appearing.     Comments: More alert today. Uses walker for ambulation  Cardiovascular:     Rate and Rhythm: Normal rate and regular rhythm.     Pulses: Normal pulses.     Heart sounds: Normal heart sounds. No murmur.  Pulmonary:     Effort: Pulmonary effort is normal. No respiratory distress.     Breath sounds: Normal breath sounds. No wheezing, rhonchi or rales.  Musculoskeletal:        General: Tenderness present.     Right lower leg: Edema (tr) present.     Left lower leg: Edema (tr) present.     Comments:  Chronic lower extremity edema with compression stockings in place Anterior RLE with ecchymosis without open wound  Skin:    General: Skin is warm and dry.     Findings: Ecchymosis present. No rash.       Neurological:     Mental Status: She is alert.  Psychiatric:        Mood and Affect: Mood normal.        Behavior: Behavior normal.       Assessment & Plan:  This visit occurred during the SARS-CoV-2 public health emergency.  Safety protocols were in place, including screening questions prior to the visit, additional usage of staff PPE, and extensive cleaning of exam room while observing appropriate  contact time as indicated for disinfecting solutions.   Problem List Items Addressed This Visit    Polypharmacy    Large portion of time spent reviewing meds she brings in - med list updated. She states she continues taking oxcarbazepine - I have asked  her to verify with psychiatry office whether she should be on this as I was told she shouldn't. She is only taking trazodone 75mg  nightly.  She also brings a bottle with zantac - states she takes this for breakthrough reflux. Bottle is expired. Advised to stop this medicine given [place] contaminant concerns and that it's now off the market - rec pepcid PRN in its place.       Lethargy    Significant improvement noted today, unclear why.  Med rec performed as per below.       Chronic venous insufficiency   Relevant Medications   furosemide (LASIX) 20 MG tablet   Chronic leg pain - Primary    Discussed lasix use for pedal edema.       Relevant Medications   traZODone (DESYREL) 150 MG tablet   (HFpEF) heart failure with preserved ejection fraction (HCC)    Discussed lasix dosing. She has been taking 40mg  BID for the past week (normally 40mg  once daily). Recommend adding potassium supplementation when she takes extra 40mg  lasix a day.       Relevant Medications   furosemide (LASIX) 20 MG tablet       Meds ordered this encounter  Medications  . furosemide (LASIX) 20 MG tablet    Sig: Take 2 tablets (40 mg total) by mouth daily. With extra 40mg  in afternoon as needed for swelling    Dispense:  90 tablet    Refill:  3    Note new sig  . famotidine (PEPCID) 20 MG tablet    Sig: Take 1 tablet (20 mg total) by mouth daily as needed for heartburn (breakthrough heartburn).  . potassium chloride (MICRO-K) 10 MEQ CR capsule    Sig: Take 1 capsule (10 mEq total) by mouth daily for 3 days.    Dispense:  30 capsule    Refill:  1   No orders of the defined types were placed in this encounter.   Patient Instructions  Call [facility] [phone] to verify if you should be on oxcarbazepine.  Stop ranitidine (zantac) - this was taken off the market. If you have breakthrough heartburn, may try pepcid (famotidine) 20mg  at night.  Take a potassium pill when you take extra lasix (furosemide) in the afternoons for swelling.  Thanks for bringing in your medicines today!    Follow up plan: Return in about 4 weeks (around 08/27/2019) for follow up visit.  Ria Bush, MD

## 2019-07-30 NOTE — Patient Instructions (Addendum)
Call [facility] [phone] to verify if you should be on oxcarbazepine.  Stop ranitidine (zantac) - this was taken off the market. If you have breakthrough heartburn, may try pepcid (famotidine) 20mg  at night.  Take a potassium pill when you take extra lasix (furosemide) in the afternoons for swelling.  Thanks for bringing in your medicines today!

## 2019-07-31 ENCOUNTER — Encounter: Payer: Self-pay | Admitting: Family Medicine

## 2019-07-31 NOTE — Assessment & Plan Note (Signed)
Significant improvement noted today, unclear why.  Med rec performed as per below.

## 2019-07-31 NOTE — Assessment & Plan Note (Signed)
Large portion of time spent reviewing meds she brings in - med list updated. She states she continues taking oxcarbazepine - I have asked her to verify with psychiatry office whether she should be on this as I was told she shouldn't. She is only taking trazodone 75mg  nightly.  She also brings a bottle with zantac - states she takes this for breakthrough reflux. Bottle is expired. Advised to stop this medicine given [place] contaminant concerns and that it's now off the market - rec pepcid PRN in its place.

## 2019-07-31 NOTE — Assessment & Plan Note (Signed)
Discussed lasix use for pedal edema.

## 2019-07-31 NOTE — Assessment & Plan Note (Signed)
Discussed lasix dosing. She has been taking 40mg  BID for the past week (normally 40mg  once daily). Recommend adding potassium supplementation when she takes extra 40mg  lasix a day.

## 2019-08-01 ENCOUNTER — Encounter: Payer: Self-pay | Admitting: Dermatology

## 2019-08-01 ENCOUNTER — Telehealth: Payer: Self-pay | Admitting: Family Medicine

## 2019-08-01 ENCOUNTER — Telehealth: Payer: Self-pay | Admitting: Cardiovascular Disease

## 2019-08-01 NOTE — Telephone Encounter (Signed)
Pt also left v/m about should pt stop the Eliquis; on 07/30/19 visit Dr Darnell Level discussed with pt about cking with psychiatry about whether pt should be on oxcarbazepine and pt was advised to stop ranitidine. Pt request cb whether Dr Darnell Level wants pt to take Eliquis.

## 2019-08-01 NOTE — Telephone Encounter (Signed)
I spoke with patient and gave her information from Dr Acie Fredrickson

## 2019-08-01 NOTE — Telephone Encounter (Signed)
Eliquis does not cause sleepiness so we would not stop it for that reason We are very cautions about having patients who fall frequently on Eliquis. She may cont the eliquis but please let us know if she has any further falling episodes.

## 2019-08-01 NOTE — Telephone Encounter (Signed)
I spoke with patient who reports she saw her PCP earlier this week and was told to stop Eliquis due to it making her sleepy.  I told patient I did not see where she was instructed to stop Eliquis.  I asked her to contact her PCP to clarify which medication she was told to stop. Patient then reported she got up out of bed at 4:44 AM and fell forward onto the floor. Walker was at her bedside.  Someone came over to help her up at 5 AM.  Patient states she lost her balance. Did not hit head.  No injury noted.

## 2019-08-01 NOTE — Telephone Encounter (Signed)
Patient called stating her PCP told her not to take her Eliquis. She states her PCP did not tell her why.  She wants to know if she should stop taking it. Please advise.

## 2019-08-01 NOTE — Telephone Encounter (Signed)
Patient called and said Dr.G told patient to discontinue Eliquis. Patient checked with her cardiologist and he told her she was to continue taking Eliquis and take it twice a day.

## 2019-08-02 NOTE — Telephone Encounter (Signed)
Butch Penny called back and notified as instructed and voiced understanding.

## 2019-08-02 NOTE — Telephone Encounter (Signed)
I did not tell her to stop eliquis. I told her to stop oxcarbazepine and ranitidine. This was in her patient instructions.  I asked her to check with psychiatry about oxcarbazepine, didn't not tell her to check with cardiology.

## 2019-08-02 NOTE — Telephone Encounter (Signed)
Pt notified as instructed and pt could not hear me; pt gave phone to her companion who said he could hear me but was not completely sure he understood what I said. Butch Penny who fixes pt's meds will call back later today for instructions.

## 2019-08-07 ENCOUNTER — Ambulatory Visit: Payer: Medicare Other | Admitting: Family Medicine

## 2019-08-08 ENCOUNTER — Ambulatory Visit
Admission: RE | Admit: 2019-08-08 | Discharge: 2019-08-08 | Disposition: A | Discharge status: Home / Self Care | Payer: Medicare Other | Source: Ambulatory Visit | Attending: Family Medicine | Admitting: Family Medicine

## 2019-08-08 ENCOUNTER — Other Ambulatory Visit: Payer: Self-pay

## 2019-08-08 DIAGNOSIS — R519 Headache, unspecified: Secondary | ICD-10-CM | POA: Insufficient documentation

## 2019-08-08 DIAGNOSIS — R296 Repeated falls: Secondary | ICD-10-CM | POA: Diagnosis not present

## 2019-08-12 ENCOUNTER — Telehealth: Payer: Self-pay | Admitting: Family Medicine

## 2019-08-12 NOTE — Telephone Encounter (Signed)
Spoke pt informing Dr. Darnell Level has not received/reviewed results yet.  When he does, I will call her with results.  Pt verbalizes understanding.

## 2019-08-12 NOTE — Telephone Encounter (Signed)
Patient called to get CT results.

## 2019-08-15 ENCOUNTER — Other Ambulatory Visit: Payer: Self-pay | Admitting: Family Medicine

## 2019-08-15 DIAGNOSIS — R5383 Other fatigue: Secondary | ICD-10-CM

## 2019-08-15 DIAGNOSIS — R2681 Unsteadiness on feet: Secondary | ICD-10-CM

## 2019-08-19 ENCOUNTER — Other Ambulatory Visit: Payer: Self-pay | Admitting: Family Medicine

## 2019-08-23 ENCOUNTER — Encounter: Payer: Self-pay | Admitting: Family Medicine

## 2019-08-23 ENCOUNTER — Telehealth (INDEPENDENT_AMBULATORY_CARE_PROVIDER_SITE_OTHER): Payer: Medicare Other | Admitting: Family Medicine

## 2019-08-23 VITALS — BP 104/63 | Ht <= 58 in | Wt 139.0 lb

## 2019-08-23 DIAGNOSIS — M79604 Pain in right leg: Secondary | ICD-10-CM | POA: Diagnosis not present

## 2019-08-23 DIAGNOSIS — E1149 Type 2 diabetes mellitus with other diabetic neurological complication: Secondary | ICD-10-CM

## 2019-08-23 DIAGNOSIS — G8929 Other chronic pain: Secondary | ICD-10-CM

## 2019-08-23 DIAGNOSIS — K279 Peptic ulcer, site unspecified, unspecified as acute or chronic, without hemorrhage or perforation: Secondary | ICD-10-CM

## 2019-08-23 DIAGNOSIS — R2681 Unsteadiness on feet: Secondary | ICD-10-CM | POA: Diagnosis not present

## 2019-08-23 DIAGNOSIS — I10 Essential (primary) hypertension: Secondary | ICD-10-CM

## 2019-08-23 DIAGNOSIS — M79605 Pain in left leg: Secondary | ICD-10-CM

## 2019-08-23 DIAGNOSIS — E1151 Type 2 diabetes mellitus with diabetic peripheral angiopathy without gangrene: Secondary | ICD-10-CM

## 2019-08-23 DIAGNOSIS — Z8673 Personal history of transient ischemic attack (TIA), and cerebral infarction without residual deficits: Secondary | ICD-10-CM | POA: Insufficient documentation

## 2019-08-23 DIAGNOSIS — I5032 Chronic diastolic (congestive) heart failure: Secondary | ICD-10-CM | POA: Diagnosis not present

## 2019-08-23 DIAGNOSIS — F411 Generalized anxiety disorder: Secondary | ICD-10-CM

## 2019-08-23 DIAGNOSIS — I480 Paroxysmal atrial fibrillation: Secondary | ICD-10-CM

## 2019-08-23 DIAGNOSIS — Z79899 Other long term (current) drug therapy: Secondary | ICD-10-CM | POA: Diagnosis not present

## 2019-08-23 DIAGNOSIS — I872 Venous insufficiency (chronic) (peripheral): Secondary | ICD-10-CM

## 2019-08-23 NOTE — Assessment & Plan Note (Signed)
Continue lasix, dilt, metoprolol.

## 2019-08-23 NOTE — Assessment & Plan Note (Deleted)
Reviewed new regimen of protonix 40mg  bid with pepcid PRN breakthrough symptoms.

## 2019-08-23 NOTE — Assessment & Plan Note (Signed)
Trouble affording [place] - she will touch base with endo at f/u next month.

## 2019-08-23 NOTE — Progress Notes (Signed)
[name] - 86 y.o. female  MRN [id number]  Date of Birth: [birth year]  PCP: Ria Bush, MD  This service was provided via telemedicine. Phone Visit performed on 08/23/2019    Rationale for phone visit along with limitations reviewed. Patient consented to telephone encounter.    Location of patient: at home Location of provider: in office [place] @ [facility] Name of referring provider: N/A   Names of persons and role in encounter: Provider: Ria Bush, MD  Patient: [name]  Other: N/A   Time on call: 10:43am - 11:05am   Subjective: Chief Complaint  Patient presents with  . Follow-up    3-4 wk f/u.   Marland Kitchen Fatigue    C/o chest congestion and fatigue.  Started about 2 wks ago.       HPI:  Here for 1 mo f/u visit - converted to phone visit given congestion concerns.  Felt ill yesterday - but today feeling much better after good bowel movement (she took miralax).  Ongoing bilateral leg pains. She regularly uses vaseline and compression hoses daily.   See prior note for details. Last visit she brought all her meds, med rec was able to be performed and patient was advised to stop oxcarbazepine and ranitidine. She should continue eliquis.   Recent CT showed prior L frontal lobe infarct as well as moderate parenchymal volume loss with chronic microvascular angiopathy, with disproportionate ventricular enlargement - pending neurology evaluation r/o NPH. She does have chronic unsteady gait and did have period of increased confusion when CT was ordered, mentation has since largely improved.   She has had several recent falls in the past few months. She regularly uses her walker.  She is struggling to afford januvia ($195/mo). She will touch base with endo about this at upcoming appt next month.   Objective/Observations:  No physical exam or vital signs collected unless specifically identified below.   BP 104/63   Ht 4\' 9"  (1.448 m)   Wt 139 lb (63  kg)   BMI 30.08 kg/m    Gen: hard of hearing  Respiratory status: speaks in complete sentences without evident shortness of breath   Assessment/Plan:  History of CVA in adulthood Newly noted on latest CT.   GAD (generalized anxiety disorder) Seems she did not call Lattie Haw to f/u about oxcarbazepine. Encouraged she call to verify that she should be off this.   Hypertension BP low today - consider lower dose antihypertensive if BP remains low.   (HFpEF) heart failure with preserved ejection fraction (HCC) Continue lasix, dilt, metoprolol.  Unsteady gait Continue rollator use at all times.  Discussed neurology eval given recent abnormal head CT. # provided for her to call.   Type 2 diabetes mellitus with neurological manifestations, controlled ([place]) Trouble affording [place] - she will touch base with endo at f/u next month.   Paroxysmal atrial fibrillation (HCC) Continue eliquis and metoprolol  Peptic ulcer disease Reviewed new regimen of protonix 40mg  bid with pepcid PRN breakthrough symptoms.    I discussed the assessment and treatment plan with the patient. The patient was provided an opportunity to ask questions and all were answered. The patient agreed with the plan and demonstrated an understanding of the instructions.  Lab Orders  No laboratory test(s) ordered today    No orders of the defined types were placed in this encounter.   The patient was advised to call back or seek an in-person evaluation if the symptoms worsen or if the  condition fails to improve as anticipated.  Ria Bush, MD

## 2019-08-23 NOTE — Assessment & Plan Note (Signed)
Newly noted on latest CT.

## 2019-08-23 NOTE — Assessment & Plan Note (Signed)
Continue eliquis and metoprolol. 

## 2019-08-23 NOTE — Assessment & Plan Note (Addendum)
Continue rollator use at all times.  Discussed neurology eval given recent abnormal head CT. # provided for her to call.

## 2019-08-23 NOTE — Assessment & Plan Note (Signed)
Reviewed new regimen of protonix 40mg  bid with pepcid PRN breakthrough symptoms.

## 2019-08-23 NOTE — Assessment & Plan Note (Signed)
BP low today - consider lower dose antihypertensive if BP remains low.

## 2019-08-23 NOTE — Assessment & Plan Note (Signed)
Seems she did not call Lattie Haw to f/u about oxcarbazepine. Encouraged she call to verify that she should be off this.

## 2019-08-30 ENCOUNTER — Ambulatory Visit: Payer: Medicare Other | Admitting: Family Medicine

## 2019-09-02 ENCOUNTER — Other Ambulatory Visit: Payer: Self-pay

## 2019-09-02 ENCOUNTER — Emergency Department: Payer: Medicare Other

## 2019-09-02 ENCOUNTER — Inpatient Hospital Stay
Admission: EM | Admit: 2019-09-02 | Discharge: 2019-09-05 | DRG: 292 | Disposition: A | Discharge status: Home Health | Payer: Medicare Other | Attending: Internal Medicine | Admitting: Internal Medicine

## 2019-09-02 ENCOUNTER — Inpatient Hospital Stay (HOSPITAL_COMMUNITY)
Admit: 2019-09-02 | Discharge: 2019-09-02 | Disposition: A | Discharge status: Home / Self Care | Payer: Medicare Other | Attending: Internal Medicine | Admitting: Internal Medicine

## 2019-09-02 ENCOUNTER — Encounter: Payer: Self-pay | Admitting: Emergency Medicine

## 2019-09-02 DIAGNOSIS — R531 Weakness: Secondary | ICD-10-CM | POA: Diagnosis present

## 2019-09-02 DIAGNOSIS — I2510 Atherosclerotic heart disease of native coronary artery without angina pectoris: Secondary | ICD-10-CM | POA: Diagnosis present

## 2019-09-02 DIAGNOSIS — K279 Peptic ulcer, site unspecified, unspecified as acute or chronic, without hemorrhage or perforation: Secondary | ICD-10-CM | POA: Diagnosis present

## 2019-09-02 DIAGNOSIS — Z90710 Acquired absence of both cervix and uterus: Secondary | ICD-10-CM

## 2019-09-02 DIAGNOSIS — M7989 Other specified soft tissue disorders: Secondary | ICD-10-CM | POA: Diagnosis present

## 2019-09-02 DIAGNOSIS — M79604 Pain in right leg: Secondary | ICD-10-CM | POA: Diagnosis present

## 2019-09-02 DIAGNOSIS — M810 Age-related osteoporosis without current pathological fracture: Secondary | ICD-10-CM | POA: Diagnosis present

## 2019-09-02 DIAGNOSIS — R600 Localized edema: Secondary | ICD-10-CM

## 2019-09-02 DIAGNOSIS — F411 Generalized anxiety disorder: Secondary | ICD-10-CM | POA: Diagnosis present

## 2019-09-02 DIAGNOSIS — I82409 Acute embolism and thrombosis of unspecified deep veins of unspecified lower extremity: Secondary | ICD-10-CM

## 2019-09-02 DIAGNOSIS — I480 Paroxysmal atrial fibrillation: Secondary | ICD-10-CM | POA: Diagnosis present

## 2019-09-02 DIAGNOSIS — F329 Major depressive disorder, single episode, unspecified: Secondary | ICD-10-CM | POA: Diagnosis present

## 2019-09-02 DIAGNOSIS — Z951 Presence of aortocoronary bypass graft: Secondary | ICD-10-CM

## 2019-09-02 DIAGNOSIS — I110 Hypertensive heart disease with heart failure: Secondary | ICD-10-CM | POA: Diagnosis present

## 2019-09-02 DIAGNOSIS — N183 Chronic kidney disease, stage 3 unspecified: Secondary | ICD-10-CM | POA: Diagnosis present

## 2019-09-02 DIAGNOSIS — E1122 Type 2 diabetes mellitus with diabetic chronic kidney disease: Secondary | ICD-10-CM | POA: Diagnosis not present

## 2019-09-02 DIAGNOSIS — I361 Nonrheumatic tricuspid (valve) insufficiency: Secondary | ICD-10-CM | POA: Diagnosis not present

## 2019-09-02 DIAGNOSIS — Z7984 Long term (current) use of oral hypoglycemic drugs: Secondary | ICD-10-CM

## 2019-09-02 DIAGNOSIS — Z823 Family history of stroke: Secondary | ICD-10-CM

## 2019-09-02 DIAGNOSIS — I252 Old myocardial infarction: Secondary | ICD-10-CM

## 2019-09-02 DIAGNOSIS — I25119 Atherosclerotic heart disease of native coronary artery with unspecified angina pectoris: Secondary | ICD-10-CM

## 2019-09-02 DIAGNOSIS — R413 Other amnesia: Secondary | ICD-10-CM | POA: Diagnosis present

## 2019-09-02 DIAGNOSIS — Z888 Allergy status to other drugs, medicaments and biological substances status: Secondary | ICD-10-CM

## 2019-09-02 DIAGNOSIS — E785 Hyperlipidemia, unspecified: Secondary | ICD-10-CM | POA: Diagnosis present

## 2019-09-02 DIAGNOSIS — N184 Chronic kidney disease, stage 4 (severe): Secondary | ICD-10-CM | POA: Diagnosis not present

## 2019-09-02 DIAGNOSIS — R296 Repeated falls: Secondary | ICD-10-CM | POA: Diagnosis present

## 2019-09-02 DIAGNOSIS — Z85828 Personal history of other malignant neoplasm of skin: Secondary | ICD-10-CM | POA: Diagnosis not present

## 2019-09-02 DIAGNOSIS — D509 Iron deficiency anemia, unspecified: Secondary | ICD-10-CM | POA: Diagnosis present

## 2019-09-02 DIAGNOSIS — I5033 Acute on chronic diastolic (congestive) heart failure: Secondary | ICD-10-CM | POA: Diagnosis present

## 2019-09-02 DIAGNOSIS — Z79899 Other long term (current) drug therapy: Secondary | ICD-10-CM

## 2019-09-02 DIAGNOSIS — E1151 Type 2 diabetes mellitus with diabetic peripheral angiopathy without gangrene: Secondary | ICD-10-CM | POA: Diagnosis present

## 2019-09-02 DIAGNOSIS — Z20822 Contact with and (suspected) exposure to covid-19: Secondary | ICD-10-CM | POA: Diagnosis present

## 2019-09-02 DIAGNOSIS — I5030 Unspecified diastolic (congestive) heart failure: Secondary | ICD-10-CM | POA: Diagnosis present

## 2019-09-02 DIAGNOSIS — R079 Chest pain, unspecified: Secondary | ICD-10-CM | POA: Diagnosis not present

## 2019-09-02 DIAGNOSIS — Z86718 Personal history of other venous thrombosis and embolism: Secondary | ICD-10-CM

## 2019-09-02 DIAGNOSIS — I4892 Unspecified atrial flutter: Secondary | ICD-10-CM | POA: Diagnosis present

## 2019-09-02 DIAGNOSIS — I5082 Biventricular heart failure: Secondary | ICD-10-CM | POA: Diagnosis present

## 2019-09-02 DIAGNOSIS — Z7901 Long term (current) use of anticoagulants: Secondary | ICD-10-CM

## 2019-09-02 DIAGNOSIS — I4819 Other persistent atrial fibrillation: Secondary | ICD-10-CM | POA: Diagnosis present

## 2019-09-02 DIAGNOSIS — Z833 Family history of diabetes mellitus: Secondary | ICD-10-CM

## 2019-09-02 DIAGNOSIS — I10 Essential (primary) hypertension: Secondary | ICD-10-CM | POA: Diagnosis not present

## 2019-09-02 DIAGNOSIS — Z87891 Personal history of nicotine dependence: Secondary | ICD-10-CM

## 2019-09-02 DIAGNOSIS — I872 Venous insufficiency (chronic) (peripheral): Secondary | ICD-10-CM | POA: Diagnosis present

## 2019-09-02 DIAGNOSIS — I509 Heart failure, unspecified: Secondary | ICD-10-CM | POA: Diagnosis not present

## 2019-09-02 DIAGNOSIS — Z8249 Family history of ischemic heart disease and other diseases of the circulatory system: Secondary | ICD-10-CM

## 2019-09-02 DIAGNOSIS — R0602 Shortness of breath: Secondary | ICD-10-CM | POA: Diagnosis not present

## 2019-09-02 DIAGNOSIS — E1159 Type 2 diabetes mellitus with other circulatory complications: Secondary | ICD-10-CM | POA: Diagnosis present

## 2019-09-02 DIAGNOSIS — I82401 Acute embolism and thrombosis of unspecified deep veins of right lower extremity: Secondary | ICD-10-CM | POA: Diagnosis not present

## 2019-09-02 LAB — BASIC METABOLIC PANEL
Anion gap: 10 (ref 5–15)
BUN: 18 mg/dL (ref 8–23)
CO2: 30 mmol/L (ref 22–32)
Calcium: 9 mg/dL (ref 8.9–10.3)
Chloride: 98 mmol/L (ref 98–111)
Creatinine, Ser: 0.96 mg/dL (ref 0.44–1.00)
GFR calc Af Amer: >60 mL/min (ref >60)
GFR calc non Af Amer: 52 mL/min — ABNORMAL LOW (ref >60)
Glucose, Bld: 246 mg/dL — ABNORMAL HIGH (ref 70–99)
Potassium: 3.6 mmol/L (ref 3.5–5.1)
Sodium: 138 mmol/L (ref 135–145)

## 2019-09-02 LAB — CBC
HCT: 39.8 % (ref 36.0–46.0)
Hemoglobin: 12.5 g/dL (ref 12.0–15.0)
MCH: 29.4 pg (ref 26.0–34.0)
MCHC: 31.4 g/dL (ref 30.0–36.0)
MCV: 93.6 fL (ref 80.0–100.0)
Platelets: 263 10*3/uL (ref 150–400)
RBC: 4.25 MIL/uL (ref 3.87–5.11)
RDW: 15.9 % — ABNORMAL HIGH (ref 11.5–15.5)
WBC: 8.1 10*3/uL (ref 4.0–10.5)
nRBC: 0 % (ref 0.0–0.2)

## 2019-09-02 LAB — HEPATIC FUNCTION PANEL
ALT: 17 U/L (ref 0–44)
AST: 23 U/L (ref 15–41)
Albumin: 3.4 g/dL — ABNORMAL LOW (ref 3.5–5.0)
Alkaline Phosphatase: 75 U/L (ref 38–126)
Bilirubin, Direct: 0.3 mg/dL — ABNORMAL HIGH (ref 0.0–0.2)
Indirect Bilirubin: 1 mg/dL — ABNORMAL HIGH (ref 0.3–0.9)
Total Bilirubin: 1.3 mg/dL — ABNORMAL HIGH (ref 0.3–1.2)
Total Protein: 7.1 g/dL (ref 6.5–8.1)

## 2019-09-02 LAB — TROPONIN I (HIGH SENSITIVITY)
Troponin I (High Sensitivity): 11 ng/L (ref <18)
Troponin I (High Sensitivity): 11 ng/L (ref <18)

## 2019-09-02 LAB — GLUCOSE, CAPILLARY
Glucose-Capillary: 120 mg/dL — ABNORMAL HIGH (ref 70–99)
Glucose-Capillary: 161 mg/dL — ABNORMAL HIGH (ref 70–99)

## 2019-09-02 LAB — HEMOGLOBIN A1C
Hgb A1c MFr Bld: 7.4 % — ABNORMAL HIGH (ref 4.8–5.6)
Mean Plasma Glucose: 165.68 mg/dL

## 2019-09-02 LAB — SARS CORONAVIRUS 2 BY RT PCR (HOSPITAL ORDER, PERFORMED IN ~~LOC~~ HOSPITAL LAB): SARS Coronavirus 2: NEGATIVE

## 2019-09-02 LAB — BRAIN NATRIURETIC PEPTIDE: B Natriuretic Peptide: 409.8 pg/mL — ABNORMAL HIGH (ref 0.0–100.0)

## 2019-09-02 MED ORDER — INSULIN ASPART 100 UNIT/ML ~~LOC~~ SOLN
0.0000 [IU] | Freq: Three times a day (TID) | SUBCUTANEOUS | Status: DC
  Administered 2019-09-03: 3 [IU] via SUBCUTANEOUS
  Administered 2019-09-03: 2 [IU] via SUBCUTANEOUS
  Administered 2019-09-04: 5 [IU] via SUBCUTANEOUS
  Administered 2019-09-04 – 2019-09-05 (×3): 2 [IU] via SUBCUTANEOUS
  Filled 2019-09-02 (×6): qty 1

## 2019-09-02 MED ORDER — B COMPLEX-C PO TABS
1.0000 | ORAL_TABLET | Freq: Every day | ORAL | Status: DC
  Administered 2019-09-02 – 2019-09-05 (×3): 1 via ORAL
  Filled 2019-09-02 (×5): qty 1

## 2019-09-02 MED ORDER — POLYETHYLENE GLYCOL 3350 17 G PO PACK
17.0000 g | PACK | Freq: Every day | ORAL | Status: DC
  Administered 2019-09-04: 17 g via ORAL
  Filled 2019-09-02 (×2): qty 1

## 2019-09-02 MED ORDER — POTASSIUM CHLORIDE CRYS ER 20 MEQ PO TBCR
20.0000 meq | EXTENDED_RELEASE_TABLET | Freq: Every day | ORAL | Status: DC
  Administered 2019-09-02 – 2019-09-05 (×4): 20 meq via ORAL
  Filled 2019-09-02 (×4): qty 1

## 2019-09-02 MED ORDER — SODIUM CHLORIDE 0.9 % IV SOLN: 250.0000 mL | INTRAVENOUS | Status: DC | PRN

## 2019-09-02 MED ORDER — FUROSEMIDE 10 MG/ML IJ SOLN
40.0000 mg | Freq: Two times a day (BID) | INTRAMUSCULAR | Status: DC
  Administered 2019-09-02 – 2019-09-04 (×4): 40 mg via INTRAVENOUS
  Filled 2019-09-02 (×4): qty 4

## 2019-09-02 MED ORDER — APIXABAN 5 MG PO TABS
5.0000 mg | ORAL_TABLET | Freq: Two times a day (BID) | ORAL | Status: DC
  Administered 2019-09-02 – 2019-09-05 (×5): 5 mg via ORAL
  Filled 2019-09-02 (×6): qty 1

## 2019-09-02 MED ORDER — LORATADINE 10 MG PO TABS: 10.0000 mg | ORAL_TABLET | Freq: Every day | ORAL | Status: DC | PRN

## 2019-09-02 MED ORDER — ICAPS PO CAPS: 1.0000 | ORAL_CAPSULE | Freq: Every day | ORAL | Status: DC

## 2019-09-02 MED ORDER — OXCARBAZEPINE 150 MG PO TABS
150.0000 mg | ORAL_TABLET | Freq: Every day | ORAL | Status: DC
  Administered 2019-09-02: 150 mg via ORAL
  Filled 2019-09-02 (×4): qty 1

## 2019-09-02 MED ORDER — ACETAMINOPHEN 500 MG PO TABS
500.0000 mg | ORAL_TABLET | Freq: Four times a day (QID) | ORAL | Status: DC | PRN
  Administered 2019-09-03: 500 mg via ORAL
  Filled 2019-09-02: qty 1

## 2019-09-02 MED ORDER — ONDANSETRON HCL 4 MG/2ML IJ SOLN: 4.0000 mg | Freq: Four times a day (QID) | INTRAMUSCULAR | Status: DC | PRN

## 2019-09-02 MED ORDER — LOPERAMIDE HCL 2 MG PO CAPS: 2.0000 mg | ORAL_CAPSULE | Freq: Four times a day (QID) | ORAL | Status: DC | PRN

## 2019-09-02 MED ORDER — DILTIAZEM HCL ER COATED BEADS 120 MG PO CP24
120.0000 mg | ORAL_CAPSULE | Freq: Every day | ORAL | Status: DC
  Administered 2019-09-02 – 2019-09-05 (×4): 120 mg via ORAL
  Filled 2019-09-02 (×4): qty 1

## 2019-09-02 MED ORDER — POLYETHYLENE GLYCOL 3350 17 GM/SCOOP PO POWD
17.0000 g | Freq: Every day | ORAL | Status: DC
  Filled 2019-09-02: qty 255

## 2019-09-02 MED ORDER — PRAVASTATIN SODIUM 20 MG PO TABS
20.0000 mg | ORAL_TABLET | Freq: Every day | ORAL | Status: DC
  Administered 2019-09-02 – 2019-09-05 (×4): 20 mg via ORAL
  Filled 2019-09-02 (×4): qty 1

## 2019-09-02 MED ORDER — METOPROLOL TARTRATE 50 MG PO TABS
75.0000 mg | ORAL_TABLET | Freq: Two times a day (BID) | ORAL | Status: DC
  Administered 2019-09-02 – 2019-09-05 (×5): 75 mg via ORAL
  Filled 2019-09-02 (×6): qty 1

## 2019-09-02 MED ORDER — ALPRAZOLAM 0.25 MG PO TABS
0.5000 mg | ORAL_TABLET | Freq: Three times a day (TID) | ORAL | Status: DC | PRN
  Administered 2019-09-03 – 2019-09-04 (×3): 0.5 mg via ORAL
  Filled 2019-09-02 (×4): qty 2

## 2019-09-02 MED ORDER — HYDROCODONE-ACETAMINOPHEN 5-325 MG PO TABS
0.5000 | ORAL_TABLET | Freq: Four times a day (QID) | ORAL | Status: DC | PRN
  Administered 2019-09-04 – 2019-09-05 (×4): 0.5 via ORAL
  Filled 2019-09-02 (×4): qty 1

## 2019-09-02 MED ORDER — TRAZODONE HCL 50 MG PO TABS
75.0000 mg | ORAL_TABLET | Freq: Every day | ORAL | Status: DC
  Administered 2019-09-02 – 2019-09-03 (×2): 75 mg via ORAL
  Filled 2019-09-02 (×3): qty 2

## 2019-09-02 MED ORDER — ALPRAZOLAM 0.25 MG PO TABS: 0.5000 mg | ORAL_TABLET | Freq: Four times a day (QID) | ORAL | Status: DC | PRN

## 2019-09-02 MED ORDER — SODIUM CHLORIDE 0.9% FLUSH
3.0000 mL | Freq: Two times a day (BID) | INTRAVENOUS | Status: DC
  Administered 2019-09-02 – 2019-09-05 (×7): 3 mL via INTRAVENOUS

## 2019-09-02 MED ORDER — FUROSEMIDE 10 MG/ML IJ SOLN
40.0000 mg | Freq: Once | INTRAMUSCULAR | Status: AC
  Administered 2019-09-02: 40 mg via INTRAVENOUS
  Filled 2019-09-02: qty 4

## 2019-09-02 MED ORDER — NITROGLYCERIN 0.4 MG SL SUBL: 0.4000 mg | SUBLINGUAL_TABLET | SUBLINGUAL | Status: DC | PRN

## 2019-09-02 MED ORDER — PANTOPRAZOLE SODIUM 40 MG PO TBEC
40.0000 mg | DELAYED_RELEASE_TABLET | Freq: Two times a day (BID) | ORAL | Status: DC
  Administered 2019-09-02 – 2019-09-05 (×5): 40 mg via ORAL
  Filled 2019-09-02 (×6): qty 1

## 2019-09-02 MED ORDER — SODIUM CHLORIDE 0.9% FLUSH: 3.0000 mL | INTRAVENOUS | Status: DC | PRN

## 2019-09-02 NOTE — Progress Notes (Signed)
*  PRELIMINARY RESULTS* Echocardiogram 2D Echocardiogram has been performed.  [name] 09/02/2019, 6:56 PM

## 2019-09-02 NOTE — ED Notes (Signed)
Provider at bedside

## 2019-09-02 NOTE — H&P (Signed)
History and Physical    [name]:[id number] DOB: [birth year] DOA: 09/02/2019  PCP: Ria Bush, MD   Patient coming from: Home I have personally briefly reviewed patient's old medical records in [place]  Chief Complaint: Shortness of breath and chest pain   HPI: [name] is a 86 y.o. female with medical history significant for coronary artery disease, chronic venous insufficiency, depression, DVT and CHF who presents to the emergency room for evaluation of shortness of breath and worsening leg swelling.  Patient states that she keeps her lower extremities elevated and takes her diuretic therapy as recommended but still continues to have swelling in her lower extremities.  She was recently in the hospital for acute exacerbation of CHF and comes in today at the request of her family of shortness of breath which was initially with exertion but now occurs at rest associated with weakness and lower extremity swelling.  Patient has had frequent falls at home and does not have anyone at home for support. Chest x-ray shows cardiomegaly and suggested mild pulmonary edema. Twelve-lead EKG showed atrial flutter  ED Course: Patient is a 86 year old female who presents to the emergency room for evaluation of worsening shortness of breath and lower extremity swelling and chest x-ray shows cardiomegaly with mild pulmonary edema.  She will be admitted to the hospital for further evaluation.  Review of Systems: As per HPI otherwise 10 point review of systems negative.    Past Medical History:  Diagnosis Date   Acute deep vein thrombosis (DVT) of distal vein of right lower extremity ([place]) 12/2013   Acute thrombus of R proximal gastrocnemius vein. Symptomatic provoked distal DVT   Anxiety    Basal cell carcinoma 10/30/2018   R nasal bridge, treated with Moh's   Cellulitis of leg, left 01/16/2014   Chronic back pain 03/2012   lumbar DDD with herniation - s/p L S1  transforaminal ESI (10/2012 Dr. Sharlet Salina)   Chronic venous insufficiency    Coronary artery disease    CABG 1998   Cystocele    Depression    Diabetes mellitus    type 2, followed by endo.   Diastolic dysfunction    Diverticulosis    Hemorrhoids    History of heart attack    Hx of echocardiogram    a. Echo 10/13: mild LVH, EF 55-60%, Gr 1 diast dysfn, PASP 32   Hyperlipidemia    Hypertension    Irritable bowel syndrome    Microcytic anemia    PAD (peripheral artery disease) ([place]) 2013   ABI: R 0.91, L 0.83   Postmenopausal osteoporosis 01/2019   T score of -2.6   PUD (peptic ulcer disease)    Varicose veins    Venous ulcer of leg ([place]) 12/12/2013   Required prolonged treatment by wound center   Viral gastroenteritis due to [place] virus 05/13/2015   hospitalization    Past Surgical History:  Procedure Laterality Date   ABDOMINAL HYSTERECTOMY  1975   TAH,BSO   ABI  2013   R 0.91, L 0.83   APPENDECTOMY     per pt report   CARDIOVASCULAR STRESS TEST  2015   low risk myoview (Nahser)   CATARACT SURGERY     CHOLECYSTECTOMY     COLONOSCOPY  02/2010   extensive diverticulosis throughout, small internal hemorrhoids, rec rpt 5 yrs (Dr. Allyn Kenner)   [place]   dexa scan  09/2011   femur -0.8, forearm 0.6 =>  normal   ENDOVENOUS ABLATION SAPHENOUS VEIN W/ LASER Left 03/2014   Kellie Simmering   ESI  2014   L S1 transforaminal ESI x2 ([place]) without improvement   HAND SURGERY     lexiscan myoview  03/2011   Small basal inferolateral and anterolateral reversible perfusion defect suggests ischemia.  EF was normal.   old infarct, no new ischemia   LOWER EXTREMITY ANGIOGRAPHY Right 09/06/2018   Procedure: LOWER EXTREMITY ANGIOGRAPHY;  Surgeon: Algernon Huxley, MD;  Location: [place] [facility];  Service: Cardiovascular;  Laterality: Right;   toe surgery     TYMPANOPLASTY  1960s   R side   Vault prolapse A & P repair  2009   Dr  Rhodia Albright [facility]     reports that she quit smoking about 40 years ago. She has never used smokeless tobacco. She reports that she does not drink alcohol or use drugs.  Allergies  Allergen Reactions   Metformin And Related Diarrhea    Family History  Problem Relation Age of Onset   Hypertension Mother    Stroke Mother    Diabetes Father    Diabetes Sister    Diabetes Brother    Heart disease Son    Colon cancer Neg Hx      Prior to Admission medications   Medication Sig Start Date End Date Taking? Authorizing Provider  acetaminophen (TYLENOL) 500 MG tablet Take 500 mg by mouth every 6 (six) hours as needed for mild pain.    [provider]  ALPRAZolam Duanne Moron) 1 MG tablet Take 0.5 tablets (0.5 mg total) by mouth 4 (four) times daily as needed for anxiety. 07/26/19   Ria Bush, MD  apixaban (ELIQUIS) 5 MG TABS tablet Take 1 tablet (5 mg total) by mouth 2 (two) times daily. 12/13/18   Nahser, Wonda Cheng, MD  B Complex-C (B COMPLEX-VITAMIN C PO) Take by mouth daily. +zinc    [provider]  diclofenac sodium (VOLTAREN) 1 % GEL Apply 1 application topically 3 (three) times daily. 01/09/17   Ria Bush, MD  diltiazem (DILACOR XR) 120 MG 24 hr capsule Take 1 capsule (120 mg total) by mouth daily. 06/04/19   Ria Bush, MD  famotidine (PEPCID) 20 MG tablet Take 1 tablet (20 mg total) by mouth daily as needed for heartburn (breakthrough heartburn). 07/30/19   Ria Bush, MD  furosemide (LASIX) 20 MG tablet Take 2 tablets (40 mg total) by mouth daily. With extra 40mg  in afternoon as needed for swelling 07/30/19   Ria Bush, MD  glucosamine-chondroitin 500-400 MG tablet Take 1 tablet by mouth daily.     [provider]  glucose blood (ONETOUCH VERIO) test strip USE TO TEST BLOOD SUGAR ONCE DAILY. DX:E11.9 06/11/19   Elayne Snare, MD  HYDROcodone-acetaminophen (NORCO/VICODIN) 5-325 MG tablet Take 0.5 tablets by mouth every 6  (six) hours as needed for moderate pain. 07/12/19   Ria Bush, MD  loperamide (IMODIUM) 2 MG capsule Take 2 mg by mouth 4 (four) times daily as needed for diarrhea or loose stools.    [provider]  loratadine (CLARITIN) 10 MG tablet Take 10 mg by mouth daily as needed for allergies.    [provider]  metoprolol tartrate (LOPRESSOR) 50 MG tablet Take 75 mg by mouth 2 (two) times daily. Takes 1.5 tablets 2 times a day 06/25/19   Ria Bush, MD  Multiple Vitamins-Minerals (ICAPS) CAPS Take 1 capsule by mouth daily.    [provider]  nitroGLYCERIN (  NITROSTAT) 0.4 MG SL tablet Place 1 tablet (0.4 mg total) under the tongue every 5 (five) minutes as needed for chest pain. 02/02/17   Nahser, Wonda Cheng, MD  nystatin cream (MYCOSTATIN) Apply 1 application topically 2 (two) times daily. 09/04/18   Ria Bush, MD  nystatin-triamcinolone ointment [facility]) Apply to affected area twice a day as needed for itching 07/26/19   [provider]  OXcarbazepine (TRILEPTAL) 150 MG tablet Take 150 mg by mouth daily. Every evening    [provider]  pantoprazole (PROTONIX) 40 MG tablet TAKE 1 TABLET BY MOUTH TWICE A DAY 06/27/19   Ria Bush, MD  pioglitazone (ACTOS) 30 MG tablet TAKE 1 TABLET BY MOUTH EVERY DAY 01/28/19   Elayne Snare, MD  polyethylene glycol powder (GLYCOLAX/MIRALAX) powder Take 17 g by mouth daily. Hold for diarrhea 06/18/18   Ria Bush, MD  potassium chloride (MICRO-K) 10 MEQ CR capsule Take 1 capsule (10 mEq total) by mouth daily for 3 days. 07/30/19 08/02/19  Ria Bush, MD  pravastatin (PRAVACHOL) 20 MG tablet TAKE 1 TABLET BY MOUTH EVERY DAY 08/19/19   Ria Bush, MD  Probiotic Product (ALIGN) 4 MG CAPS Take 1 capsule (4 mg total) by mouth daily. 08/15/17   Ria Bush, MD  sitaGLIPtin (JANUVIA) 100 MG tablet Take 1/2 tablet by mouth once daily. 07/15/19   Elayne Snare, MD  traZODone (DESYREL) 150 MG tablet  Take 0.5 tablets (75 mg total) by mouth at bedtime. 07/30/19   Ria Bush, MD  vitamin E 1000 UNIT capsule Take 1,000 Units by mouth daily.    [provider]    Physical Exam: Vitals:   09/02/19 1230 09/02/19 1328 09/02/19 1330 09/02/19 1400  BP: 136/74 136/85 116/83 132/83  Pulse: (!) 112 (!) 114 (!) 114 (!) 114  Resp: (!) 22  16 (!) 21  Temp:      TempSrc:      SpO2: 95% 95% 94% 95%  Weight:      Height:         Vitals:   09/02/19 1230 09/02/19 1328 09/02/19 1330 09/02/19 1400  BP: 136/74 136/85 116/83 132/83  Pulse: (!) 112 (!) 114 (!) 114 (!) 114  Resp: (!) 22  16 (!) 21  Temp:      TempSrc:      SpO2: 95% 95% 94% 95%  Weight:      Height:        Constitutional: NAD, alert and oriented x 3.  Hard of hearing  Eyes: PERRL, lids and conjunctivae normal ENMT: Mucous membranes are moist.  Neck: normal, supple, no masses, no thyromegaly Respiratory:  Rales at the bases, no wheezing, no crackles. Normal respiratory effort. No accessory muscle use.  Cardiovascular: Regular rate and rhythm, no murmurs / rubs / gallops. 1+ extremity edema. 2+ pedal pulses. No carotid bruits.  Abdomen: no tenderness, no masses palpated. No hepatosplenomegaly. Bowel sounds positive.  Musculoskeletal: no clubbing / cyanosis. No joint deformity upper and lower extremities.  Skin: no rashes, lesions, ulcers.  Neurologic: No gross focal neurologic deficit. Psychiatric: Normal mood and affect.   Labs on Admission: I have personally reviewed following labs and imaging studies  CBC: Recent Labs  Lab 09/02/19 1100  WBC 8.1  HGB 12.5  HCT 39.8  MCV 93.6  PLT 856   Basic Metabolic Panel: Recent Labs  Lab 09/02/19 1100  NA 138  K 3.6  CL 98  CO2 30  GLUCOSE 246*  BUN 18  CREATININE 0.96  CALCIUM 9.0   GFR: Estimated Creatinine Clearance: 29.8 mL/min (by C-G formula based on SCr of 0.96 mg/dL). Liver Function Tests: Recent Labs  Lab 09/02/19 1100  AST 23  ALT 17    ALKPHOS 75  BILITOT 1.3*  PROT 7.1  ALBUMIN 3.4*   No results for input(s): LIPASE, AMYLASE in the last 168 hours. No results for input(s): AMMONIA in the last 168 hours. Coagulation Profile: No results for input(s): INR, PROTIME in the last 168 hours. Cardiac Enzymes: No results for input(s): CKTOTAL, CKMB, CKMBINDEX, TROPONINI in the last 168 hours. BNP (last 3 results) No results for input(s): PROBNP in the last 8760 hours. HbA1C: No results for input(s): HGBA1C in the last 72 hours. CBG: No results for input(s): GLUCAP in the last 168 hours. Lipid Profile: No results for input(s): CHOL, HDL, LDLCALC, TRIG, CHOLHDL, LDLDIRECT in the last 72 hours. Thyroid Function Tests: No results for input(s): TSH, T4TOTAL, [name]4, T3FREE, THYROIDAB in the last 72 hours. Anemia Panel: No results for input(s): VITAMINB12, FOLATE, FERRITIN, TIBC, IRON, RETICCTPCT in the last 72 hours. Urine analysis:    Component Value Date/Time   COLORURINE YELLOW (A) 07/16/2019 1240   APPEARANCEUR HAZY (A) 07/16/2019 1240   LABSPEC 1.017 07/16/2019 1240   PHURINE 6.0 07/16/2019 1240   GLUCOSEU NEGATIVE 07/16/2019 1240   GLUCOSEU NEGATIVE 09/04/2018 1346   HGBUR NEGATIVE 07/16/2019 1240   BILIRUBINUR NEGATIVE 07/16/2019 1240   BILIRUBINUR negative 01/08/2016 1504   KETONESUR NEGATIVE 07/16/2019 1240   PROTEINUR NEGATIVE 07/16/2019 1240   UROBILINOGEN 0.2 09/04/2018 1346   NITRITE NEGATIVE 07/16/2019 1240   LEUKOCYTESUR NEGATIVE 07/16/2019 1240    Radiological Exams on Admission: DG Chest 2 View  Result Date: 09/02/2019 CLINICAL DATA:  Chest pain EXAM: CHEST - 2 VIEW COMPARISON:  July 16, 2019 FINDINGS: Stable cardiomegaly. The hila and mediastinum are unchanged. No pneumothorax. No nodules or masses. Mild increased interstitial markings in the lungs suggestive of mild pulmonary edema. No focal infiltrates. IMPRESSION: Cardiomegaly and suggested mild pulmonary edema. Electronically Signed   By:  Dorise Bullion III M.D   On: 09/02/2019 11:22    EKG: Independently reviewed.  Atrial flutter   Assessment/Plan Principal Problem:   (HFpEF) heart failure with preserved ejection fraction (HCC) Active Problems:   Coronary artery disease   GAD (generalized anxiety disorder)   Hypertension   CKD stage 4 due to type 2 diabetes mellitus ([place])    Heart failure with preserved ejection fraction Patient presents for evaluation of worsening shortness of breath from her baseline associated with lower extremity swelling Chest x-ray shows mild cardiomegaly with pulmonary edema Patient's last 2D echo showed an EF of 55 to 60% Optimize blood pressure control Place patient on Lasix 40 mg IV twice daily Continue metoprolol   Diabetes mellitus with complications of stage IV chronic kidney disease Renal function is stable We will monitor closely during this hospitalization Maintain consistent carbohydrate diet Glycemic control with sliding scale coverage   History of atrial flutter Continue metoprolol and Cardizem for rate control Patient is on apixaban as primary prophylaxis for an acute stroke   Hypertension Blood pressure stable Continue diltiazem and metoprolol   Anxiety Continue as needed Xanax and trazodone    Coronary artery disease Continue metoprolol, statins and aspirin  DVT prophylaxis: Apixaban Code Status: Full Family Communication: Greater than 50% of time was spent discussing patient's condition and plan of care with her and her friend at the bedside.  All questions and concerns have been  addressed.  They verbalized understanding and agree with the plan.  CODE STATUS was discussed and patient wishes to be a full code Disposition Plan: Back to previous home environment Consults called: Physical therapy    Nihaal Friesen MD [facility]     09/02/2019, 2:54 PM

## 2019-09-02 NOTE — ED Triage Notes (Signed)
Here for bilateral leg pain. Swelling noted to BLE, right worse than left. Pt c/o difficulty breathing and keeps talking about chest but will not state if pain or what type of feeling she is having.  HOH.  Unlabored, VSS

## 2019-09-02 NOTE — ED Provider Notes (Signed)
[facility] Emergency Department Provider Note    First MD Initiated Contact with Patient 09/02/19 1138     (approximate)  I have reviewed the triage vital signs and the nursing notes.   HISTORY  Chief Complaint Shortness of Breath and Chest Pain    HPI [name] is a 86 y.o. female presents to the ER for evaluation of shortness of breath and worsening leg swelling.  Also complains some chest tightness but states that this tightness has been ongoing for 6 months.  Denies any fevers.  Has been compliant with her medications.  She is on Eliquis with history of DVT.  Had recent evaluation for similar symptoms with reassuring work-up negative CTPE study and symptoms improved after aggressive diuresis.  Patient's friend at bedside states that she has had significant decline in status with exertional dyspnea frequent falls and does not have anyone at home for support.    Past Medical History:  Diagnosis Date  . Acute deep vein thrombosis (DVT) of distal vein of right lower extremity ([place]) 12/2013   Acute thrombus of R proximal gastrocnemius vein. Symptomatic provoked distal DVT  . Anxiety   . Basal cell carcinoma 10/30/2018   R nasal bridge, treated with Moh's  . Cellulitis of leg, left 01/16/2014  . Chronic back pain 03/2012   lumbar DDD with herniation - s/p L S1 transforaminal ESI (10/2012 Dr. Sharlet Salina)  . Chronic venous insufficiency   . Coronary artery disease    CABG 1998  . Cystocele   . Depression   . Diabetes mellitus    type 2, followed by endo.  . Diastolic dysfunction   . Diverticulosis   . Hemorrhoids   . History of heart attack   . Hx of echocardiogram    a. Echo 10/13: mild LVH, EF 55-60%, Gr 1 diast dysfn, PASP 32  . Hyperlipidemia   . Hypertension   . Irritable bowel syndrome   . Microcytic anemia   . PAD (peripheral artery disease) ([place]) 2013   ABI: R 0.91, L 0.83  . Postmenopausal osteoporosis 01/2019   T score of -2.6  .  PUD (peptic ulcer disease)   . Varicose veins   . Venous ulcer of leg ([place]) 12/12/2013   Required prolonged treatment by wound center  . Viral gastroenteritis due to [place] virus 05/13/2015   hospitalization   Family History  Problem Relation Age of Onset  . Hypertension Mother   . Stroke Mother   . Diabetes Father   . Diabetes Sister   . Diabetes Brother   . Heart disease Son   . Colon cancer Neg Hx    Past Surgical History:  Procedure Laterality Date  . ABDOMINAL HYSTERECTOMY  1975   TAH,BSO  . ABI  2013   R 0.91, L 0.83  . APPENDECTOMY     per pt report  . CARDIOVASCULAR STRESS TEST  2015   low risk myoview (Nahser)  . CATARACT SURGERY    . CHOLECYSTECTOMY    . COLONOSCOPY  02/2010   extensive diverticulosis throughout, small internal hemorrhoids, rec rpt 5 yrs (Dr. Allyn Kenner)  . CORONARY ARTERY BYPASS GRAFT  1998  . dexa scan  09/2011   femur -0.8, forearm 0.6 => normal  . ENDOVENOUS ABLATION SAPHENOUS VEIN W/ LASER Left 03/2014   Kellie Simmering  . ESI  2014   L S1 transforaminal ESI x2 ([place]) without improvement  . HAND SURGERY    . lexiscan myoview  03/2011  Small basal inferolateral and anterolateral reversible perfusion defect suggests ischemia.  EF was normal.   old infarct, no new ischemia  . LOWER EXTREMITY ANGIOGRAPHY Right 09/06/2018   Procedure: LOWER EXTREMITY ANGIOGRAPHY;  Surgeon: Algernon Huxley, MD;  Location: [facility];  Service: Cardiovascular;  Laterality: Right;  . toe surgery    . TYMPANOPLASTY  1960s   R side  . Vault prolapse A & P repair  2009   Dr Rhodia Albright [facility]   Patient Active Problem List   Diagnosis Date Noted  . History of CVA in adulthood 08/23/2019  . Lethargy 07/27/2019  . Polypharmacy 07/27/2019  . Breast mass 04/30/2019  . Pneumonia 02/15/2019  . E coli bacteremia 02/13/2019  . E. coli UTI 02/13/2019  . Dyspnea   . Diverticulitis of duodenum   . Duodenal ulcer perforation ([place]) 02/08/2019  . Chronic leg pain  11/14/2018  . Atherosclerosis of native arteries of the extremities with ulceration ([place]) 08/21/2018  . Chronic constipation 06/18/2018  . Erythema of breast 04/18/2018  . MRSA cellulitis 03/31/2018  . Advanced directives, counseling/discussion 01/29/2018  . Bilateral hearing loss 01/29/2018  . Spinal stenosis of lumbosacral region 01/08/2018  . Systolic murmur 47/65/4650  . De Quervain's tenosynovitis, left 01/09/2017  . Encounter for chronic pain management 02/09/2016  . Paroxysmal atrial fibrillation (HCC)   . Varicose veins of left lower extremity with inflammation, with ulcer of calf limited to breakdown of skin ([place]) 05/22/2015  . Varicose veins of right lower extremity with inflammation 01/23/2015  . Diabetes mellitus type 2 with peripheral artery disease ([place])   . Chronic venous insufficiency   . Bleeding from varicose veins of lower extremity, left 02/13/2014  . Diabetic polyneuropathy ([place]) 11/19/2013  . Type 2 diabetes mellitus with neurological manifestations, controlled ([place]) 11/19/2013  . Right shoulder pain 08/19/2013  . Cerumen impaction 04/16/2013  . Abdominal pain 05/14/2012  . Peptic ulcer disease   . Unsteady gait 12/13/2011  . Chronic lower back pain 09/14/2011  . Microcytic anemia 09/06/2010  . Hyperlipidemia 09/06/2010  . GAD (generalized anxiety disorder) 03/15/2010  . Obesity, Class I, BMI 30-34.9 03/15/2010  . Hypertension 03/15/2010  . (HFpEF) heart failure with preserved ejection fraction ([place]) 03/15/2010  . Coronary artery disease       Prior to Admission medications   Medication Sig Start Date End Date Taking? Authorizing Provider  acetaminophen (TYLENOL) 500 MG tablet Take 500 mg by mouth every 6 (six) hours as needed for mild pain.    [provider]  ALPRAZolam Duanne Moron) 1 MG tablet Take 0.5 tablets (0.5 mg total) by mouth 4 (four) times daily as needed for anxiety. 07/26/19   Ria Bush, MD  apixaban (ELIQUIS) 5 MG TABS tablet  Take 1 tablet (5 mg total) by mouth 2 (two) times daily. 12/13/18   Nahser, Wonda Cheng, MD  B Complex-C (B COMPLEX-VITAMIN C PO) Take by mouth daily. +zinc    [provider]  diclofenac sodium (VOLTAREN) 1 % GEL Apply 1 application topically 3 (three) times daily. 01/09/17   Ria Bush, MD  diltiazem (DILACOR XR) 120 MG 24 hr capsule Take 1 capsule (120 mg total) by mouth daily. 06/04/19   Ria Bush, MD  famotidine (PEPCID) 20 MG tablet Take 1 tablet (20 mg total) by mouth daily as needed for heartburn (breakthrough heartburn). 07/30/19   Ria Bush, MD  furosemide (LASIX) 20 MG tablet Take 2 tablets (40 mg total) by mouth daily. With extra 40mg  in afternoon  as needed for swelling 07/30/19   Ria Bush, MD  glucosamine-chondroitin 500-400 MG tablet Take 1 tablet by mouth daily.     [provider]  glucose blood (ONETOUCH VERIO) test strip USE TO TEST BLOOD SUGAR ONCE DAILY. DX:E11.9 06/11/19   Elayne Snare, MD  HYDROcodone-acetaminophen (NORCO/VICODIN) 5-325 MG tablet Take 0.5 tablets by mouth every 6 (six) hours as needed for moderate pain. 07/12/19   Ria Bush, MD  loperamide (IMODIUM) 2 MG capsule Take 2 mg by mouth 4 (four) times daily as needed for diarrhea or loose stools.    [provider]  loratadine (CLARITIN) 10 MG tablet Take 10 mg by mouth daily as needed for allergies.    [provider]  metoprolol tartrate (LOPRESSOR) 50 MG tablet Take 75 mg by mouth 2 (two) times daily. Takes 1.5 tablets 2 times a day 06/25/19   Ria Bush, MD  Multiple Vitamins-Minerals (ICAPS) CAPS Take 1 capsule by mouth daily.    [provider]  nitroGLYCERIN (NITROSTAT) 0.4 MG SL tablet Place 1 tablet (0.4 mg total) under the tongue every 5 (five) minutes as needed for chest pain. 02/02/17   Nahser, Wonda Cheng, MD  nystatin cream (MYCOSTATIN) Apply 1 application topically 2 (two) times daily. 09/04/18   Ria Bush, MD    nystatin-triamcinolone ointment [facility]) Apply to affected area twice a day as needed for itching 07/26/19   [provider]  OXcarbazepine (TRILEPTAL) 150 MG tablet Take 150 mg by mouth daily. Every evening    [provider]  pantoprazole (PROTONIX) 40 MG tablet TAKE 1 TABLET BY MOUTH TWICE A DAY 06/27/19   Ria Bush, MD  pioglitazone (ACTOS) 30 MG tablet TAKE 1 TABLET BY MOUTH EVERY DAY 01/28/19   Elayne Snare, MD  polyethylene glycol powder (GLYCOLAX/MIRALAX) powder Take 17 g by mouth daily. Hold for diarrhea 06/18/18   Ria Bush, MD  potassium chloride (MICRO-K) 10 MEQ CR capsule Take 1 capsule (10 mEq total) by mouth daily for 3 days. 07/30/19 08/02/19  Ria Bush, MD  pravastatin (PRAVACHOL) 20 MG tablet TAKE 1 TABLET BY MOUTH EVERY DAY 08/19/19   Ria Bush, MD  Probiotic Product (ALIGN) 4 MG CAPS Take 1 capsule (4 mg total) by mouth daily. 08/15/17   Ria Bush, MD  sitaGLIPtin (JANUVIA) 100 MG tablet Take 1/2 tablet by mouth once daily. 07/15/19   Elayne Snare, MD  traZODone (DESYREL) 150 MG tablet Take 0.5 tablets (75 mg total) by mouth at bedtime. 07/30/19   Ria Bush, MD  vitamin E 1000 UNIT capsule Take 1,000 Units by mouth daily.    [provider]    Allergies Metformin and related    Social History Social History   Tobacco Use  . Smoking status: Former Smoker    Quit date: 03/05/1979    Years since quitting: 40.5  . Smokeless tobacco: Never Used  Substance Use Topics  . Alcohol use: No    Alcohol/week: 0.0 standard drinks  . Drug use: No    Review of Systems Patient denies headaches, rhinorrhea, blurry vision, numbness, shortness of breath, chest pain, edema, cough, abdominal pain, nausea, vomiting, diarrhea, dysuria, fevers, rashes or hallucinations unless otherwise stated above in HPI. ____________________________________________   PHYSICAL EXAM:  VITAL SIGNS: Vitals:   09/02/19 1055 09/02/19  1200  BP: 131/75 131/79  Pulse: (!) 114 (!) 112  Resp: 18 (!) 21  Temp: 97.9 F (36.6 C)   SpO2: 93% 98%    Constitutional: Alert and oriented.  Eyes: Conjunctivae are normal.  Head: Atraumatic. Nose: No congestion/rhinnorhea. Mouth/Throat: Mucous membranes are moist.   Neck: No stridor. Painless ROM.  Cardiovascular: Normal rate, regular rhythm. Grossly normal heart sounds.  Good peripheral circulation. Respiratory: mild tachypnea with posterior crackles and anterior wheeze. Gastrointestinal: Soft and nontender. No distention. No abdominal bruits. No CVA tenderness. Genitourinary:  Musculoskeletal: No lower extremity tenderness.  BLE r>L pitting edema and venous stasis changes.  No joint effusions. Neurologic:  Normal speech and language. No gross focal neurologic deficits are appreciated. No facial droop Skin:  Skin is warm, dry and intact. No rash noted. Psychiatric: Mood and affect are normal. Speech and behavior are normal.  ____________________________________________   LABS (all labs ordered are listed, but only abnormal results are displayed)  Results for orders placed or performed during the hospital encounter of 09/02/19 (from the past 24 hour(s))  Basic metabolic panel     Status: Abnormal   Collection Time: 09/02/19 11:00 AM  Result Value Ref Range   Sodium 138 135 - 145 mmol/L   Potassium 3.6 3.5 - 5.1 mmol/L   Chloride 98 98 - 111 mmol/L   CO2 30 22 - 32 mmol/L   Glucose, Bld 246 (H) 70 - 99 mg/dL   BUN 18 8 - 23 mg/dL   Creatinine, Ser 0.96 0.44 - 1.00 mg/dL   Calcium 9.0 8.9 - 10.3 mg/dL   GFR calc non Af Amer 52 (L) >60 mL/min   GFR calc Af Amer >60 >60 mL/min   Anion gap 10 5 - 15  CBC     Status: Abnormal   Collection Time: 09/02/19 11:00 AM  Result Value Ref Range   WBC 8.1 4.0 - 10.5 K/uL   RBC 4.25 3.87 - 5.11 MIL/uL   Hemoglobin 12.5 12.0 - 15.0 g/dL   HCT 39.8 36.0 - 46.0 %   MCV 93.6 80.0 - 100.0 fL   MCH 29.4 26.0 - 34.0 pg   MCHC 31.4  30.0 - 36.0 g/dL   RDW 15.9 (H) 11.5 - 15.5 %   Platelets 263 150 - 400 K/uL   nRBC 0.0 0.0 - 0.2 %  Troponin I (High Sensitivity)     Status: None   Collection Time: 09/02/19 11:00 AM  Result Value Ref Range   Troponin I (High Sensitivity) 11 <18 ng/L  Brain natriuretic peptide     Status: Abnormal   Collection Time: 09/02/19 11:00 AM  Result Value Ref Range   B Natriuretic Peptide 409.8 (H) 0.0 - 100.0 pg/mL  Hepatic function panel     Status: Abnormal   Collection Time: 09/02/19 11:00 AM  Result Value Ref Range   Total Protein 7.1 6.5 - 8.1 g/dL   Albumin 3.4 (L) 3.5 - 5.0 g/dL   AST 23 15 - 41 U/L   ALT 17 0 - 44 U/L   Alkaline Phosphatase 75 38 - 126 U/L   Total Bilirubin 1.3 (H) 0.3 - 1.2 mg/dL   Bilirubin, Direct 0.3 (H) 0.0 - 0.2 mg/dL   Indirect Bilirubin 1.0 (H) 0.3 - 0.9 mg/dL   *Note: Due to a large number of results and/or encounters for the requested time period, some results have not been displayed. A complete set of results can be found in Results Review.   ____________________________________________  EKG My review and personal interpretation at Time: 10:57   Indication: sob  Rate: 115  Rhythm: slow 2:1 a flutter vs sinus Axis: normal Other: nonspecific st abn, no stemi ____________________________________________  RADIOLOGY  I personally reviewed all radiographic images ordered to evaluate for the above acute complaints and reviewed radiology reports and findings.  These findings were personally discussed with the patient.  Please see medical record for radiology report.  ____________________________________________   PROCEDURES  Procedure(s) performed:  Procedures    Critical Care performed: no ____________________________________________   INITIAL IMPRESSION / ASSESSMENT AND PLAN / ED COURSE  Pertinent labs & imaging results that were available during my care of the patient were reviewed by me and considered in my medical decision making (see  chart for details).   DDX: chf, edema, anasarca, aki, noncompliance, cellulitis,  [name] is a 86 y.o. who presents to the ED with symptoms as described above.  Patient is tachypneic but no hypoxia.  Exam as above.  I do suspect this is worsening congestive heart failure.  Chest x-ray with edema on exam no infiltrate.  Abdominal exam is soft and benign.  Will order Lasix.  Clinical Course as of Sep 01 1248  Mon Sep 02, 2019  1248 Patient has started diuresing but is still mildly tachypneic.  Not requiring supplemental oxygen but does endorse orthopnea and significant exertional dyspnea and I suspect with any ambulation she would desaturate therefore will discuss with hospitalist for admission  Have discussed with the patient and available family all diagnostics and treatments performed thus far and all questions were answered to the best of my ability. The patient demonstrates understanding and agreement with plan.    [PR]    Clinical Course User Index [PR] Merlyn Lot, MD    The patient was evaluated in Emergency Department today for the symptoms described in the history of present illness. He/she was evaluated in the context of the global COVID-19 pandemic, which necessitated consideration that the patient might be at risk for infection with the SARS-CoV-2 virus that causes COVID-19. Institutional protocols and algorithms that pertain to the evaluation of patients at risk for COVID-19 are in a state of rapid change based on information released by regulatory bodies including the CDC and federal and state organizations. These policies and algorithms were followed during the patient's care in the ED.  As part of my medical decision making, I reviewed the following data within the [facility] notes reviewed and incorporated, Labs reviewed, notes from prior ED visits and [place] Controlled Substance  Database   ____________________________________________   FINAL CLINICAL IMPRESSION(S) / ED DIAGNOSES  Final diagnoses:  SOB (shortness of breath)  Lower extremity edema      NEW MEDICATIONS STARTED DURING THIS VISIT:  New Prescriptions   No medications on file     Note:  This document was prepared using Dragon voice recognition software and may include unintentional dictation errors.    Merlyn Lot, MD 09/02/19 1250

## 2019-09-02 NOTE — Plan of Care (Signed)
  Problem: Education: Goal: Knowledge of General Education information will improve Description Including pain rating scale, medication(s)/side effects and non-pharmacologic comfort measures Outcome: Progressing   

## 2019-09-03 ENCOUNTER — Telehealth: Payer: Self-pay

## 2019-09-03 ENCOUNTER — Inpatient Hospital Stay: Payer: Medicare Other

## 2019-09-03 DIAGNOSIS — I480 Paroxysmal atrial fibrillation: Secondary | ICD-10-CM

## 2019-09-03 DIAGNOSIS — E1122 Type 2 diabetes mellitus with diabetic chronic kidney disease: Secondary | ICD-10-CM

## 2019-09-03 DIAGNOSIS — R0602 Shortness of breath: Secondary | ICD-10-CM

## 2019-09-03 DIAGNOSIS — I82401 Acute embolism and thrombosis of unspecified deep veins of right lower extremity: Secondary | ICD-10-CM

## 2019-09-03 DIAGNOSIS — N184 Chronic kidney disease, stage 4 (severe): Secondary | ICD-10-CM

## 2019-09-03 LAB — RESPIRATORY PANEL BY PCR

## 2019-09-03 LAB — GLUCOSE, CAPILLARY
Glucose-Capillary: 125 mg/dL — ABNORMAL HIGH (ref 70–99)
Glucose-Capillary: 152 mg/dL — ABNORMAL HIGH (ref 70–99)
Glucose-Capillary: 123 mg/dL — ABNORMAL HIGH (ref 70–99)
Glucose-Capillary: 144 mg/dL — ABNORMAL HIGH (ref 70–99)

## 2019-09-03 LAB — ECHOCARDIOGRAM COMPLETE
Height: 57 in
Weight: 2250.46 oz

## 2019-09-03 LAB — BASIC METABOLIC PANEL
Anion gap: 10 (ref 5–15)
BUN: 18 mg/dL (ref 8–23)
CO2: 31 mmol/L (ref 22–32)
Calcium: 8.5 mg/dL — ABNORMAL LOW (ref 8.9–10.3)
Chloride: 99 mmol/L (ref 98–111)
Creatinine, Ser: 1 mg/dL (ref 0.44–1.00)
GFR calc Af Amer: 57 mL/min — ABNORMAL LOW (ref >60)
GFR calc non Af Amer: 50 mL/min — ABNORMAL LOW (ref >60)
Glucose, Bld: 121 mg/dL — ABNORMAL HIGH (ref 70–99)
Potassium: 3.5 mmol/L (ref 3.5–5.1)
Sodium: 140 mmol/L (ref 135–145)

## 2019-09-03 NOTE — Telephone Encounter (Signed)
[place] Night - Client TELEPHONE ADVICE RECORD [facility] Patient Name: Donnie Schaaf Gender: Female DOB: Aug 27, 1927 Age: 86 Y 2 M 26 D Return Phone Number: [phone] (Primary) Address: City/State/Zip: [place] Client [place] Night - Client Client Site [place] Physician Ria Bush - MD Contact Type Call Who Is Calling Patient / Member / Family / Caregiver Call Type Triage / Clinical Relationship To Patient Self Return Phone Number [phone] (Primary) Chief Complaint BREATHING - fast, heavy or wheezing Reason for Call Symptomatic / Request for Health Information Initial Comment Caller states she is weak and leg swelling and red, breathing heavy, difficulty breathing, Translation No Nurse Assessment Nurse: Rolin Barry, RN, Levada Dy Date/Time (Eastern Time): 09/02/2019 9:30:38 AM Confirm and document reason for call. If symptomatic, describe symptoms. ---Caller advised that she is very sick and that she is very sick and weak. States that she is having difficulty breathing. Legs are swollen. Not eating. Advised that she is not eating. Caller has no temp. Has the patient had close contact with a person known or suspected to have the novel coronavirus illness OR traveled / lives in area with major community spread (including international travel) in the last 14 days from the onset of symptoms? * If Asymptomatic, screen for exposure and travel within the last 14 days. ---No Does the patient have any new or worsening symptoms? ---Yes Will a triage be completed? ---Yes Related visit to physician within the last 2 weeks? ---No Does the PT have any chronic conditions? (i.e. diabetes, asthma, this includes High risk factors for pregnancy, etc.) ---Unknown Is this a behavioral health or substance abuse call? ---No Guidelines Guideline Title Affirmed Question Affirmed Notes Nurse Date/Time  (Eastern Time) Breathing Difficulty SEVERE difficulty breathing (e.g., struggling for each breath, speaks in single words) Deaton, RN, Levada Dy 09/02/2019 9:33:31 AM Disp. Time Eilene Ghazi Time) Disposition Final User 09/02/2019 9:37:53 AM 911 Outcome Documentation Deaton, RN, Levada Dy PLEASE NOTE: All timestamps contained within this report are represented as [place] Standard Time. CONFIDENTIALTY NOTICE: This fax transmission is intended only for the addressee. It contains information that is legally privileged, confidential or otherwise protected from use or disclosure. If you are not the intended recipient, you are strictly prohibited from reviewing, disclosing, copying using or disseminating any of this information or taking any action in reliance on or regarding this information. If you have received this fax in error, please notify us immediately by telephone so that we can arrange for its return to [place]. Phone: [phone], Toll-Free: [phone], Fax: [phone] Page: 2 of 2 Call Id: [id number] [facility] [place]. Time Eilene Ghazi Time) Disposition Final User Reason: Refused, going by personal vehicle 09/02/2019 9:36:48 AM Call EMS 911 Now Yes Deaton, RN, Cindee Lame Disagree/Comply Comply Caller Understands Yes PreDisposition Did not know what to do Care Advice Given Per Guideline CALL EMS 911 NOW: * Immediate medical attention is needed. You need to hang up and call 911 (or an ambulance). * Triager Discretion: I'll call you back in a few minutes to be sure you were able to reach them. CARE ADVICE given per Breathing Difficulty (Adult) guideline. Referrals [facility] - ED

## 2019-09-03 NOTE — Progress Notes (Signed)
PROGRESS NOTE    LILIANE MALLIS  LSL:[id number] DOB: 06-20-1927 DOA: 09/02/2019 PCP: Ria Bush, MD     Brief Narrative:  [name] is a 86 y.o. WF PMHx Anxiety, depression, CAD, Chronic Venous insufficiency, RIGHT lower extremity DVT and MI, Chronic Diastolic CHF, s/p CABG 1157, PUD  who presents to the emergency room for evaluation of shortness of breath and worsening leg swelling.  Patient states that she keeps her lower extremities elevated and takes her diuretic therapy as recommended but still continues to have swelling in her lower extremities.  She was recently in the hospital for acute exacerbation of CHF and comes in today at the request of her family of shortness of breath which was initially with exertion but now occurs at rest associated with weakness and lower extremity swelling.  Patient has had frequent falls at home and does not have anyone at home for support. Chest x-ray shows cardiomegaly and suggested mild pulmonary edema. Twelve-lead EKG showed atrial flutter  ED Course: Patient is a 86 year old female who presents to the emergency room for evaluation of worsening shortness of breath and lower extremity swelling and chest x-ray shows cardiomegaly with mild pulmonary edema.  She will be admitted to the hospital for further evaluation.   Subjective: 6/1A/O x4 but severe short term memory problems.  Will ask a question you will answer, and then she will state does not understand or you did not answer the question.   Assessment & Plan:   Principal Problem:   (HFpEF) heart failure with preserved ejection fraction (HCC) Active Problems:   Coronary artery disease   GAD (generalized anxiety disorder)   Hypertension   Paroxysmal atrial fibrillation (HCC)   CKD stage 4 due to type 2 diabetes mellitus (HCC)   CHF (congestive heart failure), NYHA class I, acute on chronic, diastolic (HCC)   Right heart failure with preserved ejection fraction  -6/1  patient now states was not having shortness of breath but only having lower extremity pain. -CXR; cardiomegaly mild pulmonary edema see results below -Trending troponins not consistent with ACS or MI. -Patient's last 2D echo showed an EF of 55 to 60% Results for DONTASIA, MIRANDA (MRN [id number]) as of 09/03/2019 16:08  Ref. Range 09/02/2019 11:00 09/02/2019 13:29  Troponin I (High Sensitivity) Latest Ref Range: <18 ng/L 11 11  -6/1 Dr. Harrell Gave End Cardiology will see patient in the morning  A flutter -Cardizem 120 mg daily -Metoprolol 75 mg BID -Apixaban 5 mg BID -Currently rate controlled  Essential HTN -See a flutter  DM type II uncontrolled with complication -4/16 hemoglobin A1c= 7.4  CKD stage IV  Recent Labs  Lab 09/02/19 1100 09/03/19 0616  CREATININE 0.96 1.00  -Does not meet criteria for CKD stage IV  CAD -See a flutter  Anxiety -Xanax PRN -Trazodone 75 mg qhs  RIGHT lower leg pain  -Per patient this is the reason she came to the ED and has not been addressed. -6/1 Doppler pending    DVT prophylaxis: Apixaban Code Status: Full Family Communication:  Disposition Plan:  Status is: Inpatient    Dispo: The patient is from: Home              Anticipated d/c is to: Home              Anticipated d/c date is: 6/3              Patient currently unstable      Consultants:  Dr. Harrell Gave  End Cardiology  Procedures/Significant Events:  5/31 CXR; cardiomegaly suggested mild pulmonary edema     I have personally reviewed and interpreted all radiology studies and my findings are as above.  VENTILATOR SETTINGS:    Cultures 5/31 SARS coronavirus negative 6/1 respiratory virus panel pending  Antimicrobials:    Devices    LINES / TUBES:      Continuous Infusions: . sodium chloride       Objective: Vitals:   09/02/19 1614 09/02/19 1958 09/03/19 0340 09/03/19 0839  BP:  (!) 86/40 104/78 124/79  Pulse:  92 85 (!) 111  Resp:  20  20 16   Temp:  99.1 F (37.3 C) 98.1 F (36.7 C) 97.7 F (36.5 C)  TempSrc:  Oral Oral   SpO2:  94% 93% 92%  Weight: 63.8 kg  64.5 kg   Height: 4\' 9"  (1.448 m)       Intake/Output Summary (Last 24 hours) at 09/03/2019 0847 Last data filed at 09/03/2019 0346 Gross per 24 hour  Intake --  Output 1150 ml  Net -1150 ml   Filed Weights   09/02/19 1056 09/02/19 1614 09/03/19 0340  Weight: 63 kg 63.8 kg 64.5 kg    Examination:  General: A/O x4, but serious short-term memory problems, no acute respiratory distress Eyes: negative scleral hemorrhage, negative anisocoria, negative icterus ENT: Negative Runny nose, negative gingival bleeding, Neck:  Negative scars, masses, torticollis, lymphadenopathy, JVD Lungs: Clear to auscultation bilaterally without wheezes or crackles Cardiovascular: Irregular irregular rhythm and rate without murmur gallop or rub normal S1 and S2 Abdomen: negative abdominal pain, nondistended, positive soft, bowel sounds, no rebound, no ascites, no appreciable mass Extremities: No significant cyanosis, clubbing, or edema bilateral lower extremities Skin: Negative rashes, lesions, ulcers Psychiatric:  Negative depression, negative anxiety, negative fatigue, negative mania  Central nervous system:  Cranial nerves II through XII intact, tongue/uvula midline, all extremities muscle strength 5/5, sensation intact throughout, negative dysarthria, negative expressive aphasia, negative receptive aphasia.  .     Data Reviewed: Care during the described time interval was provided by me .  I have reviewed this patient's available data, including medical history, events of note, physical examination, and all test results as part of my evaluation.  CBC: Recent Labs  Lab 09/02/19 1100  WBC 8.1  HGB 12.5  HCT 39.8  MCV 93.6  PLT 096   Basic Metabolic Panel: Recent Labs  Lab 09/02/19 1100 09/03/19 0616  NA 138 140  K 3.6 3.5  CL 98 99  CO2 30 31  GLUCOSE 246* 121*   BUN 18 18  CREATININE 0.96 1.00  CALCIUM 9.0 8.5*   GFR: Estimated Creatinine Clearance: 28.9 mL/min (by C-G formula based on SCr of 1 mg/dL). Liver Function Tests: Recent Labs  Lab 09/02/19 1100  AST 23  ALT 17  ALKPHOS 75  BILITOT 1.3*  PROT 7.1  ALBUMIN 3.4*   No results for input(s): LIPASE, AMYLASE in the last 168 hours. No results for input(s): AMMONIA in the last 168 hours. Coagulation Profile: No results for input(s): INR, PROTIME in the last 168 hours. Cardiac Enzymes: No results for input(s): CKTOTAL, CKMB, CKMBINDEX, TROPONINI in the last 168 hours. BNP (last 3 results) No results for input(s): PROBNP in the last 8760 hours. HbA1C: Recent Labs    09/02/19 1203  HGBA1C 7.4*   CBG: Recent Labs  Lab 09/02/19 1617 09/02/19 2039 09/03/19 0842  GLUCAP 120* 161* 125*   Lipid Profile: No results for input(s): CHOL, HDL,  LDLCALC, TRIG, CHOLHDL, LDLDIRECT in the last 72 hours. Thyroid Function Tests: No results for input(s): TSH, T4TOTAL, [name]4, T3FREE, THYROIDAB in the last 72 hours. Anemia Panel: No results for input(s): VITAMINB12, FOLATE, FERRITIN, TIBC, IRON, RETICCTPCT in the last 72 hours. Sepsis Labs: No results for input(s): PROCALCITON, LATICACIDVEN in the last 168 hours.  Recent Results (from the past 240 hour(s))  SARS Coronavirus 2 by RT PCR (hospital order, performed in [facility] hospital lab) Nasopharyngeal Nasopharyngeal Swab     Status: None   Collection Time: 09/02/19  1:29 PM   Specimen: Nasopharyngeal Swab  Result Value Ref Range Status   SARS Coronavirus 2 NEGATIVE NEGATIVE Final    Comment: (NOTE) SARS-CoV-2 target nucleic acids are NOT DETECTED. The SARS-CoV-2 RNA is generally detectable in upper and lower respiratory specimens during the acute phase of infection. The lowest concentration of SARS-CoV-2 viral copies this assay can detect is 250 copies / mL. A negative result does not preclude SARS-CoV-2 infection and should not  be used as the sole basis for treatment or other patient management decisions.  A negative result may occur with improper specimen collection / handling, submission of specimen other than nasopharyngeal swab, presence of viral mutation(s) within the areas targeted by this assay, and inadequate number of viral copies (<250 copies / mL). A negative result must be combined with clinical observations, patient history, and epidemiological information. Fact Sheet for Patients:   StrictlyIdeas.no Fact Sheet for Healthcare Providers: BankingDealers.co.za This test is not yet approved or cleared  by the [place] FDA and has been authorized for detection and/or diagnosis of SARS-CoV-2 by FDA under an Emergency Use Authorization (EUA).  This EUA will remain in effect (meaning this test can be used) for the duration of the COVID-19 declaration under Section 564(b)(1) of the Act, 21 U.S.C. section 360bbb-3(b)(1), unless the authorization is terminated or revoked sooner. Performed at [facility], [address], [place], [place] [address]          Radiology Studies: DG Chest 2 View  Result Date: 09/02/2019 CLINICAL DATA:  Chest pain EXAM: CHEST - 2 VIEW COMPARISON:  July 16, 2019 FINDINGS: Stable cardiomegaly. The hila and mediastinum are unchanged. No pneumothorax. No nodules or masses. Mild increased interstitial markings in the lungs suggestive of mild pulmonary edema. No focal infiltrates. IMPRESSION: Cardiomegaly and suggested mild pulmonary edema. Electronically Signed   By: Dorise Bullion III M.D   On: 09/02/2019 11:22        Scheduled Meds: . apixaban  5 mg Oral BID  . B-complex with vitamin C  1 tablet Oral Daily  . diltiazem  120 mg Oral Daily  . furosemide  40 mg Intravenous BID  . insulin aspart  0-15 Units Subcutaneous TID WC  . metoprolol tartrate  75 mg Oral BID  . OXcarbazepine  150 mg Oral Daily  .  pantoprazole  40 mg Oral BID  . polyethylene glycol  17 g Oral Daily  . potassium chloride  20 mEq Oral Daily  . pravastatin  20 mg Oral Daily  . sodium chloride flush  3 mL Intravenous Q12H  . traZODone  75 mg Oral QHS   Continuous Infusions: . sodium chloride       LOS: 1 day    Time spent:40 min    Dorothie Wah, Geraldo Docker, MD [facility] Pager [phone]  If 7PM-7AM, please contact night-coverage [email or web address] Password [facility] 09/03/2019, 8:47 AM

## 2019-09-03 NOTE — Telephone Encounter (Signed)
Per chart review tab pt was admitted to [facility] ED on 09/02/19.

## 2019-09-03 NOTE — Evaluation (Signed)
Physical Therapy Evaluation Patient Details Name: [name] MRN: [id number] DOB: January 26, 1928 Today's Date: 09/03/2019   History of Present Illness  Pt is a 86 y.o. female presenting to hospital 5/31 with SOB and worsening leg swelling; pt also noted with chest tightness for about 6 months.  Also per chart review pt with weakness and frequent falls at home.  Pt admitted with heart failure, DM, and atrial-flutter.  PMH includes: DVT, anxiety, chronic back pain, CAD s/p CABG 1998, DM, IBS, PAD  Clinical Impression  Prior to hospital admission, pt was modified independent with ambulation (used rollator in home and RW outside); h/o falls.  Pt reports her LE swelling is significantly improved today compared to yesterday.  Currently pt is SBA with transfers and CGA with ambulating 80 feet with RW in room.  No loss of balance noted during sessions activities.  Generalized weakness noted.  Pt would benefit from skilled PT to address noted impairments and functional limitations (see below for any additional details).  Upon hospital discharge, pt would benefit from [place].    Follow Up Recommendations Home health PT    Equipment Recommendations  (pt has RW and rollator at home already)    Recommendations for Other Services OT consult     Precautions / Restrictions Precautions Precautions: Fall Precaution Comments: Strict I/O Restrictions Weight Bearing Restrictions: No      Mobility  Bed Mobility               General bed mobility comments: Deferred (pt in recliner beginning/end of session)  Transfers Overall transfer level: Needs assistance Equipment used: Rolling walker (2 wheeled) Transfers: Sit to/from Stand Sit to Stand: Supervision         General transfer comment: fairly strong stand from recliner (up to RW) and controlled descent sitting back down into recliner  Ambulation/Gait Ambulation/Gait assistance: Min guard Gait Distance (Feet): 80 Feet(in room) Assistive  device: Rolling walker (2 wheeled)   Gait velocity: decreased   General Gait Details: partial step through gait pattern; steady with RW use  Stairs            Wheelchair Mobility    Modified Rankin (Stroke Patients Only)       Balance Overall balance assessment: Needs assistance Sitting-balance support: No upper extremity supported;Feet supported Sitting balance-Leahy Scale: Normal Sitting balance - Comments: steady sitting reaching outside BOS   Standing balance support: Single extremity supported Standing balance-Leahy Scale: Fair Standing balance comment: pt requiring at least single UE support for static standing balance                             Pertinent Vitals/Pain Pain Assessment: No/denies pain  Vitals (HR and O2 on room air) stable and WFL throughout treatment session.    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     Hand Dominance        Extremity/Trunk Assessment   Upper Extremity Assessment Upper Extremity Assessment: Generalized weakness    Lower Extremity  Assessment Lower Extremity Assessment: Generalized weakness    Cervical / Trunk Assessment Cervical / Trunk Assessment: Normal  Communication   Communication: HOH  Cognition Arousal/Alertness: Awake/alert Behavior During Therapy: WFL for tasks assessed/performed Overall Cognitive Status: Within Functional Limits for tasks assessed                                  General Comments: Talkative      General Comments   Nursing cleared pt for participation in physical therapy.  Pt agreeable to PT session.    Exercises     Assessment/Plan    PT Assessment Patient needs continued PT services  PT Problem List Decreased strength;Decreased balance;Decreased activity tolerance;Decreased mobility       PT Treatment Interventions DME instruction;Gait training;Stair training;Functional mobility training;Therapeutic activities;Therapeutic exercise;Balance training;Patient/family education    PT Goals (Current goals can be found in the Care Plan section)  Acute Rehab PT Goals Patient Stated Goal: to go home PT Goal Formulation: With patient Time For Goal Achievement: 09/17/19 Potential to Achieve Goals: Good    Frequency Min 2X/week   Barriers to discharge        Co-evaluation               AM-PAC PT "6 Clicks" Mobility  Outcome Measure Help needed turning from your back to your side while in a flat bed without using bedrails?: None Help needed moving from lying on your back to sitting on the side of a flat bed without using bedrails?: None Help needed moving to and from a bed to a chair (including a wheelchair)?: A [place] Help needed standing up from a chair using your arms (e.g., wheelchair or bedside chair)?: A [place] Help needed to walk in hospital room?: A [place] Help needed climbing 3-5 steps with a railing? : A [place] 6 Click Score: 20    End of Session Equipment Utilized During Treatment: Gait belt Activity Tolerance: Patient tolerated treatment well Patient left: in chair;with call bell/phone within reach;with chair alarm set Nurse Communication: Mobility status;Precautions;Other (comment)(NT notified of pt's request for purewick to be replaced and pt's request for a drink) PT Visit Diagnosis: Other abnormalities of gait and mobility (R26.89);Muscle weakness (generalized) (M62.81);History of falling  (Z91.81)    Time: 2330-0762 PT Time Calculation (min) (ACUTE ONLY): 25 min   Charges:   PT Evaluation $PT Eval Low Complexity: 1 Low PT Treatments $Therapeutic Exercise: 8-22 mins       Leitha Bleak, PT 09/03/19, 4:19 PM

## 2019-09-04 DIAGNOSIS — I10 Essential (primary) hypertension: Secondary | ICD-10-CM

## 2019-09-04 DIAGNOSIS — I2510 Atherosclerotic heart disease of native coronary artery without angina pectoris: Secondary | ICD-10-CM

## 2019-09-04 DIAGNOSIS — I4819 Other persistent atrial fibrillation: Secondary | ICD-10-CM

## 2019-09-04 DIAGNOSIS — F411 Generalized anxiety disorder: Secondary | ICD-10-CM

## 2019-09-04 DIAGNOSIS — I5033 Acute on chronic diastolic (congestive) heart failure: Secondary | ICD-10-CM

## 2019-09-04 DIAGNOSIS — I5030 Unspecified diastolic (congestive) heart failure: Secondary | ICD-10-CM

## 2019-09-04 LAB — CBC WITH DIFFERENTIAL/PLATELET
Abs Immature Granulocytes: 0.04 10*3/uL (ref 0.00–0.07)
Basophils Absolute: 0.1 10*3/uL (ref 0.0–0.1)
Basophils Relative: 1 %
Eosinophils Absolute: 0.2 10*3/uL (ref 0.0–0.5)
Eosinophils Relative: 3 %
HCT: 38 % (ref 36.0–46.0)
Hemoglobin: 12.4 g/dL (ref 12.0–15.0)
Immature Granulocytes: 1 %
Lymphocytes Relative: 12 %
Lymphs Abs: 1.1 10*3/uL (ref 0.7–4.0)
MCH: 29.3 pg (ref 26.0–34.0)
MCHC: 32.6 g/dL (ref 30.0–36.0)
MCV: 89.8 fL (ref 80.0–100.0)
Monocytes Absolute: 0.8 10*3/uL (ref 0.1–1.0)
Monocytes Relative: 9 %
Neutro Abs: 6.6 10*3/uL (ref 1.7–7.7)
Neutrophils Relative %: 74 %
Platelets: 238 10*3/uL (ref 150–400)
RBC: 4.23 MIL/uL (ref 3.87–5.11)
RDW: 16 % — ABNORMAL HIGH (ref 11.5–15.5)
WBC: 8.8 10*3/uL (ref 4.0–10.5)
nRBC: 0 % (ref 0.0–0.2)

## 2019-09-04 LAB — COMPREHENSIVE METABOLIC PANEL
ALT: 15 U/L (ref 0–44)
AST: 21 U/L (ref 15–41)
Albumin: 3.1 g/dL — ABNORMAL LOW (ref 3.5–5.0)
Alkaline Phosphatase: 78 U/L (ref 38–126)
Anion gap: 11 (ref 5–15)
BUN: 23 mg/dL (ref 8–23)
CO2: 30 mmol/L (ref 22–32)
Calcium: 8.7 mg/dL — ABNORMAL LOW (ref 8.9–10.3)
Chloride: 99 mmol/L (ref 98–111)
Creatinine, Ser: 0.92 mg/dL (ref 0.44–1.00)
GFR calc Af Amer: >60 mL/min (ref >60)
GFR calc non Af Amer: 55 mL/min — ABNORMAL LOW (ref >60)
Glucose, Bld: 141 mg/dL — ABNORMAL HIGH (ref 70–99)
Potassium: 3.4 mmol/L — ABNORMAL LOW (ref 3.5–5.1)
Sodium: 140 mmol/L (ref 135–145)
Total Bilirubin: 1.3 mg/dL — ABNORMAL HIGH (ref 0.3–1.2)
Total Protein: 6.5 g/dL (ref 6.5–8.1)

## 2019-09-04 LAB — GLUCOSE, CAPILLARY
Glucose-Capillary: 143 mg/dL — ABNORMAL HIGH (ref 70–99)
Glucose-Capillary: 236 mg/dL — ABNORMAL HIGH (ref 70–99)
Glucose-Capillary: 133 mg/dL — ABNORMAL HIGH (ref 70–99)
Glucose-Capillary: 145 mg/dL — ABNORMAL HIGH (ref 70–99)

## 2019-09-04 LAB — PHOSPHORUS: Phosphorus: 3 mg/dL (ref 2.5–4.6)

## 2019-09-04 LAB — MAGNESIUM: Magnesium: 1.6 mg/dL — ABNORMAL LOW (ref 1.7–2.4)

## 2019-09-04 MED ORDER — FUROSEMIDE 40 MG PO TABS
40.0000 mg | ORAL_TABLET | Freq: Two times a day (BID) | ORAL | Status: DC
  Administered 2019-09-05: 40 mg via ORAL
  Filled 2019-09-04: qty 1

## 2019-09-04 MED ORDER — POTASSIUM CHLORIDE CRYS ER 20 MEQ PO TBCR
40.0000 meq | EXTENDED_RELEASE_TABLET | Freq: Once | ORAL | Status: AC
  Administered 2019-09-04: 40 meq via ORAL
  Filled 2019-09-04: qty 2

## 2019-09-04 MED ORDER — MAGNESIUM SULFATE 2 GM/50ML IV SOLN
2.0000 g | Freq: Once | INTRAVENOUS | Status: AC
  Administered 2019-09-04: 2 g via INTRAVENOUS
  Filled 2019-09-04: qty 50

## 2019-09-04 NOTE — Plan of Care (Signed)
  Problem: Coping: Goal: Level of anxiety will decrease Outcome: Not Progressing   

## 2019-09-04 NOTE — TOC Initial Note (Signed)
Transition of Care [facility]) - Initial/Assessment Note    Patient Details  Name: [name] MRN: [id number] Date of Birth: Apr 20, 1927  Transition of Care [facility]) CM/SW Contact:    Shelbie Ammons, RN Phone Number: 09/04/2019, 10:28 AM  Clinical Narrative:     RNCM assessed patient at bedside. Patient lying in bed with eyes open, reports to feeling ok today but did not sleep as well last night as she wishes she had. Patient reports that she lives with her boyfriend, and that he is wheelchair bound himself but they help each other. Patient reports that she has friends and family that takes her back and forth to appointments and she has no difficulties picking up her medications. Patient is agreeable to home health services and reports that she does not have any preferences as to who is called.  RNCM reached out to [place] with [facility] who will accept referral for RN, PT, OT, SW and HHA. RNCM will remain available for any further needs.              Expected Discharge Plan: [place] Barriers to Discharge: Continued Medical Work up   Patient Goals and CMS Choice     Choice offered to / list presented to : Patient  Expected Discharge Plan and Services Expected Discharge Plan: [place] Acute Care Choice: [place] arrangements for the past 2 months: Single Family Home                           HH Arranged: RN, PT, OT, Nurse's Aide, Social Work [employer] Agency: [place] ([place]) Date [place]: 09/04/19 Time HH Agency Contacted: 0930 Representative spoke with at [place]: Corene Cornea  Prior Living Arrangements/Services Living arrangements for the past 2 months: [place] with:: Significant Other Patient language and need for interpreter reviewed:: Yes Do you feel safe going back to the place where you live?: Yes      Need for Family Participation in Patient Care: Yes (Comment) Care giver support system  in place?: Yes (comment) Current home services: DME Criminal Activity/Legal Involvement Pertinent to Current Situation/Hospitalization: No - Comment as needed  Activities of Daily Living Home Assistive Devices/Equipment: [name] (specify type) ADL Screening (condition at time of admission) Patient's cognitive ability adequate to safely complete daily activities?: Yes Is the patient deaf or have difficulty hearing?: Yes Does the patient have difficulty seeing, even when wearing glasses/contacts?: No Does the patient have difficulty concentrating, remembering, or making decisions?: No Patient able to express need for assistance with ADLs?: Yes Does the patient have difficulty dressing or bathing?: No Independently performs ADLs?: Yes (appropriate for developmental age) Does the patient have difficulty walking or climbing stairs?: Yes Weakness of Legs: None Weakness of Arms/Hands: None  Permission Sought/Granted                  Emotional Assessment Appearance:: Appears stated age Attitude/Demeanor/Rapport: Engaged Affect (typically observed): Appropriate Orientation: : Oriented to Self, Oriented to Place, Oriented to  Time, Oriented to Situation Alcohol / Substance Use: Not Applicable Psych Involvement: No (comment)  Admission diagnosis:  Lower extremity edema [R60.0] SOB (shortness of breath) [R06.02] CHF (congestive heart failure), NYHA class I, acute on chronic, diastolic (HCC) [A26.33] Patient Active Problem List   Diagnosis Date Noted  . CKD stage 4 due to type 2 diabetes mellitus ([place]) 09/02/2019  .  CHF (congestive heart failure), NYHA class I, acute on chronic, diastolic ([place]) 93/90/3009  . History of CVA in adulthood 08/23/2019  . Lethargy 07/27/2019  . Polypharmacy 07/27/2019  . Breast mass 04/30/2019  . Pneumonia 02/15/2019  . E coli bacteremia 02/13/2019  . E. coli UTI 02/13/2019  . Dyspnea   . Diverticulitis of duodenum   . Duodenal ulcer perforation ([place])  02/08/2019  . Chronic leg pain 11/14/2018  . Atherosclerosis of native arteries of the extremities with ulceration ([place]) 08/21/2018  . Chronic constipation 06/18/2018  . Erythema of breast 04/18/2018  . MRSA cellulitis 03/31/2018  . Advanced directives, counseling/discussion 01/29/2018  . Bilateral hearing loss 01/29/2018  . Spinal stenosis of lumbosacral region 01/08/2018  . Systolic murmur 23/30/0762  . De Quervain's tenosynovitis, left 01/09/2017  . Encounter for chronic pain management 02/09/2016  . Paroxysmal atrial fibrillation (HCC)   . Varicose veins of left lower extremity with inflammation, with ulcer of calf limited to breakdown of skin ([place]) 05/22/2015  . Varicose veins of right lower extremity with inflammation 01/23/2015  . Diabetes mellitus type 2 with peripheral artery disease ([place])   . Chronic venous insufficiency   . Bleeding from varicose veins of lower extremity, left 02/13/2014  . Diabetic polyneuropathy ([place]) 11/19/2013  . Type 2 diabetes mellitus with neurological manifestations, controlled ([place]) 11/19/2013  . Right shoulder pain 08/19/2013  . Cerumen impaction 04/16/2013  . Abdominal pain 05/14/2012  . Peptic ulcer disease   . Unsteady gait 12/13/2011  . Chronic lower back pain 09/14/2011  . Microcytic anemia 09/06/2010  . Hyperlipidemia 09/06/2010  . GAD (generalized anxiety disorder) 03/15/2010  . Obesity, Class I, BMI 30-34.9 03/15/2010  . Hypertension 03/15/2010  . (HFpEF) heart failure with preserved ejection fraction ([place]) 03/15/2010  . Coronary artery disease    PCP:  Ria Bush, MD Pharmacy:   [id number] [place], [address] [place] [address] Phone: [phone] Fax: [phone]     Social Determinants of Health (SDOH) Interventions    Readmission Risk Interventions Readmission Risk Prevention Plan 09/04/2019  Transportation Screening Complete  PCP or Specialist Appt within 3-5 Days  Complete  HRI or Home Care Consult Complete  Palliative Care Screening Not Applicable  Medication Review (RN Care Manager) Complete  Some recent data might be hidden

## 2019-09-04 NOTE — Progress Notes (Signed)
PROGRESS NOTE    [name]   YJE:[id number]  DOB: May 01, 1927  PCP: Ria Bush, MD    DOA: 09/02/2019 LOS: 2   Brief Narrative   [name] is a 86 y.o. F with past medical history of anxiety, depression, CAD sp 3vCABG in 1998, chronic venous insufficiency, RIGHT lower extremity DVT, chronic HFpEF, PUD who presented to the ED on 09/02/19 with progressively worsening shortness of breath and lower extremity edema.  Also with reports of generalized weakness and frequent falls at home.  In the ED, chest x-ray showed cardiomegaly and mild pulmonary edema.  ECG showed atrial flutter.     Assessment & Plan   Principal Problem:   (HFpEF) heart failure with preserved ejection fraction (HCC) Active Problems:   Coronary artery disease   GAD (generalized anxiety disorder)   Hypertension   Paroxysmal atrial fibrillation (HCC)   CKD stage 4 due to type 2 diabetes mellitus (HCC)   CHF (congestive heart failure), NYHA class I, acute on chronic, diastolic (HCC)   Persistent atrial fibrillation (HCC)   Acute on chronic HFpEF -present on admission with shortness of breath and increased lower extremity edema.  Improved with IV diuresis.  Echo EF 55-60%.  Cardiology consulted.  Transition to PO Lasix 40 mg PO BID.  Monitor I/O's and daily weights.    Atrial flutter -, present on admission, rate controlled.  Continue Eliquis, metoprolol, Cardizem.    Essential hypertension -chronic, stable.  Continue meds as above.  Type 2 diabetes -uncontrolled, A1c 7.4%.  Appears she takes Actos and Januvia at home.  Hold home meds.  Moderate sliding scale NovoLog.  RULED OUT: CKD stage IV -noted in prior documentation this admission.  Ruled out.  Patient does not have CKD stage IV.  It appears her baseline renal function is normal.  Coronary artery disease -history of three-vessel CABG in 1998.  No active chest pain.  Continue beta-blocker and statin.  Stat EKG and troponins if any active chest  pain.  Depression/anxiety -chronic, stable.  Continue home Xanax as needed, trazodone at bedtime.  Right lower extremity pain -present on admission, resolved.  Right lower extremity Doppler ultrasound was negative for DVTs bilaterally.  PT OT evaluation.  Setting up for home health therapy.  Patient BMI: Body mass index is 29.08 kg/m.   DVT prophylaxis: On Eliquis  Diet:  Diet Orders (From admission, onward)    Start     Ordered   09/02/19 1528  Diet Carb Modified Fluid consistency: Thin; Room service appropriate? Yes  Diet effective now    Question Answer Comment  Diet-HS Snack? Nothing   Calorie Level Medium 1600-2000   Fluid consistency: Thin   Room service appropriate? Yes      09/02/19 1527            Code Status: Full Code    Subjective 09/04/19    Patient seen at bedside this morning.  No acute events reported.  She reports feeling much better.  Denies chest pain or shortness of breath.  Says her leg swelling feels better.  She asks how she can prevent leg swelling from happening in the future.   Disposition Plan & Communication   Status is: Inpatient  Remains inpatient appropriate because:Inpatient level of care appropriate due to severity of illness .  Patient is just transition to oral diuresis and requires at least 24 more hours monitoring of volume status prior to discharge home in order to prevent readmission.  Dispo: The patient  is from: Home              Anticipated d/c is to: Home with home health services              Anticipated d/c date is: 1 day              Patient currently is not medically stable to d/c.   Family Communication: None at bedside during encounter, will attempt to call   Consults, Procedures, Significant Events   Consultants:   Cardiology   Antimicrobials:   None   Objective   Vitals:   09/03/19 1947 09/04/19 0506 09/04/19 0744 09/04/19 1151  BP: (!) 112/58 134/74 (!) 135/91 113/60  Pulse: 75 92 100 75  Resp: 20 19  18 18   Temp: 98.5 F (36.9 C) 98 F (36.7 C) 98.4 F (36.9 C) 98 F (36.7 C)  TempSrc: Oral Oral Oral Oral  SpO2: 94% 94% 93% 94%  Weight:  61 kg    Height:        Intake/Output Summary (Last 24 hours) at 09/04/2019 1533 Last data filed at 09/04/2019 1405 Gross per 24 hour  Intake 840 ml  Output 2300 ml  Net -1460 ml   Filed Weights   09/02/19 1614 09/03/19 0340 09/04/19 0506  Weight: 63.8 kg 64.5 kg 61 kg    Physical Exam:  General exam: awake, alert, no acute distress HEENT: clear conjunctiva, moist mucus membranes, hearing grossly normal  Respiratory system: CTAB, no wheezes, rales or rhonchi, normal respiratory effort. Cardiovascular system: normal S1/S2, RRR, trace pedal edema.   Gastrointestinal system: soft, NT, ND, +bowel sounds. Central nervous system: A&O x3. no gross focal neurologic deficits, normal speech Extremities: moves all, no cyanosis, normal tone Skin: dry, intact, normal temperature, normal color Psychiatry: normal mood, congruent affect, judgement and insight appear normal  Labs   Data Reviewed: I have personally reviewed following labs and imaging studies  CBC: Recent Labs  Lab 09/02/19 1100 09/04/19 0555  WBC 8.1 8.8  NEUTROABS  --  6.6  HGB 12.5 12.4  HCT 39.8 38.0  MCV 93.6 89.8  PLT 263 929   Basic Metabolic Panel: Recent Labs  Lab 09/02/19 1100 09/03/19 0616 09/04/19 0555  NA 138 140 140  K 3.6 3.5 3.4*  CL 98 99 99  CO2 30 31 30   GLUCOSE 246* 121* 141*  BUN 18 18 23   CREATININE 0.96 1.00 0.92  CALCIUM 9.0 8.5* 8.7*  MG  --   --  1.6*  PHOS  --   --  3.0   GFR: Estimated Creatinine Clearance: 30.5 mL/min (by C-G formula based on SCr of 0.92 mg/dL). Liver Function Tests: Recent Labs  Lab 09/02/19 1100 09/04/19 0555  AST 23 21  ALT 17 15  ALKPHOS 75 78  BILITOT 1.3* 1.3*  PROT 7.1 6.5  ALBUMIN 3.4* 3.1*   No results for input(s): LIPASE, AMYLASE in the last 168 hours. No results for input(s): AMMONIA in the last  168 hours. Coagulation Profile: No results for input(s): INR, PROTIME in the last 168 hours. Cardiac Enzymes: No results for input(s): CKTOTAL, CKMB, CKMBINDEX, TROPONINI in the last 168 hours. BNP (last 3 results) No results for input(s): PROBNP in the last 8760 hours. HbA1C: Recent Labs    09/02/19 1203  HGBA1C 7.4*   CBG: Recent Labs  Lab 09/03/19 1146 09/03/19 1633 09/03/19 1959 09/04/19 0746 09/04/19 1151  GLUCAP 152* 123* 144* 143* 236*   Lipid Profile: No results for  input(s): CHOL, HDL, LDLCALC, TRIG, CHOLHDL, LDLDIRECT in the last 72 hours. Thyroid Function Tests: No results for input(s): TSH, T4TOTAL, [name]4, T3FREE, THYROIDAB in the last 72 hours. Anemia Panel: No results for input(s): VITAMINB12, FOLATE, FERRITIN, TIBC, IRON, RETICCTPCT in the last 72 hours. Sepsis Labs: No results for input(s): PROCALCITON, LATICACIDVEN in the last 168 hours.  Recent Results (from the past 240 hour(s))  SARS Coronavirus 2 by RT PCR (hospital order, performed in [facility] hospital lab) Nasopharyngeal Nasopharyngeal Swab     Status: None   Collection Time: 09/02/19  1:29 PM   Specimen: Nasopharyngeal Swab  Result Value Ref Range Status   SARS Coronavirus 2 NEGATIVE NEGATIVE Final    Comment: (NOTE) SARS-CoV-2 target nucleic acids are NOT DETECTED. The SARS-CoV-2 RNA is generally detectable in upper and lower respiratory specimens during the acute phase of infection. The lowest concentration of SARS-CoV-2 viral copies this assay can detect is 250 copies / mL. A negative result does not preclude SARS-CoV-2 infection and should not be used as the sole basis for treatment or other patient management decisions.  A negative result may occur with improper specimen collection / handling, submission of specimen other than nasopharyngeal swab, presence of viral mutation(s) within the areas targeted by this assay, and inadequate number of viral copies (<250 copies / mL). A negative  result must be combined with clinical observations, patient history, and epidemiological information. Fact Sheet for Patients:   StrictlyIdeas.no Fact Sheet for Healthcare Providers: BankingDealers.co.za This test is not yet approved or cleared  by the [place] FDA and has been authorized for detection and/or diagnosis of SARS-CoV-2 by FDA under an Emergency Use Authorization (EUA).  This EUA will remain in effect (meaning this test can be used) for the duration of the COVID-19 declaration under Section 564(b)(1) of the Act, 21 U.S.C. section 360bbb-3(b)(1), unless the authorization is terminated or revoked sooner. Performed at [facility], [address] [place], [place] [address]   Respiratory Panel by PCR     Status: None   Collection Time: 09/03/19 12:42 PM   Specimen: Nasopharyngeal Swab; Respiratory  Result Value Ref Range Status   Adenovirus NOT DETECTED NOT DETECTED Final   Coronavirus 229E NOT DETECTED NOT DETECTED Final    Comment: (NOTE) The Coronavirus on the Respiratory Panel, DOES NOT test for the novel  Coronavirus (2019 nCoV)    Coronavirus HKU1 NOT DETECTED NOT DETECTED Final   Coronavirus NL63 NOT DETECTED NOT DETECTED Final   Coronavirus OC43 NOT DETECTED NOT DETECTED Final   Metapneumovirus NOT DETECTED NOT DETECTED Final   Rhinovirus / Enterovirus NOT DETECTED NOT DETECTED Final   Influenza A NOT DETECTED NOT DETECTED Final   Influenza B NOT DETECTED NOT DETECTED Final   Parainfluenza Virus 1 NOT DETECTED NOT DETECTED Final   Parainfluenza Virus 2 NOT DETECTED NOT DETECTED Final   Parainfluenza Virus 3 NOT DETECTED NOT DETECTED Final   Parainfluenza Virus 4 NOT DETECTED NOT DETECTED Final   Respiratory Syncytial Virus NOT DETECTED NOT DETECTED Final   Bordetella pertussis NOT DETECTED NOT DETECTED Final   Chlamydophila pneumoniae NOT DETECTED NOT DETECTED Final   Mycoplasma pneumoniae NOT  DETECTED NOT DETECTED Final    Comment: Performed at [facility], [place]. [address], [place], [place] [address]      Imaging Studies   US Venous Img Lower Bilateral (DVT)  Result Date: 09/04/2019 CLINICAL DATA:  Bilateral lower extremity swelling EXAM: BILATERAL LOWER EXTREMITY VENOUS DOPPLER ULTRASOUND TECHNIQUE:  Gray-scale sonography with compression, as well as color and duplex ultrasound, were performed to evaluate the deep venous system(s) from the level of the common femoral vein through the popliteal and proximal calf veins. COMPARISON:  None. FINDINGS: VENOUS Normal compressibility of the common femoral, superficial femoral, and popliteal veins, as well as the visualized calf veins. Visualized portions of profunda femoral vein and great saphenous vein unremarkable. No filling defects to suggest DVT on grayscale or color Doppler imaging. Doppler waveforms show normal direction of venous flow, normal respiratory plasticity and response to augmentation. Limited views of the contralateral common femoral vein are unremarkable. OTHER None. Limitations: none IMPRESSION: No evidence of deep venous thrombosis bilaterally. Electronically Signed   By: Inez Catalina M.D.   On: 09/04/2019 02:00   ECHOCARDIOGRAM COMPLETE  Result Date: 09/03/2019    ECHOCARDIOGRAM REPORT   Patient Name:   [name] Date of Exam: 09/02/2019 Medical Rec #:  [id number]        Height:       57.0 in Accession #:    [id number]       Weight:       140.7 lb Date of Birth:  1927-07-01         BSA:          1.549 m Patient Age:    15 years         BP:           132/83 mmHg Patient Gender: F                HR:           116 bpm. Exam Location:  [facility] Procedure: 2D Echo, Cardiac Doppler and Color Doppler Indications:     Congestive Heart Failure 428.0 / I50.9  History:         Patient has prior history of Echocardiogram examinations. CAD;                  Risk Factors:Hypertension.  Sonographer:     Alyse Low Roar Referring Phys:   [id number] Diagnosing Phys: Nelva Bush MD IMPRESSIONS  1. Left ventricular ejection fraction, by estimation, is 50 to 55%. The left ventricle has low normal function. The left ventricle has no regional wall motion abnormalities. Left ventricular diastolic parameters are indeterminate.  2. Right ventricular systolic function is moderately reduced. The right ventricular size is normal. There is moderately elevated pulmonary artery systolic pressure.  3. The mitral valve is normal in structure. Moderate mitral valve regurgitation. No evidence of mitral stenosis.  4. Tricuspid valve regurgitation is mild to moderate.  5. Aortic valve is mildly thickened; cannot exclude bicuspid morphology. The aortic valve is abnormal. Aortic valve regurgitation is not visualized. No aortic stenosis is present.  6. The inferior vena cava is dilated in size with <50% respiratory variability, suggesting right atrial pressure of 15 mmHg. FINDINGS  Left Ventricle: Left ventricular ejection fraction, by estimation, is 50 to 55%. The left ventricle has low normal function. The left ventricle has no regional wall motion abnormalities. The left ventricular internal cavity size was normal in size. There is no left ventricular hypertrophy. Left ventricular diastolic parameters are indeterminate. Right Ventricle: The right ventricular size is normal. No increase in right ventricular wall thickness. Right ventricular systolic function is moderately reduced. There is moderately elevated pulmonary artery systolic pressure. The tricuspid regurgitant velocity is 2.63 m/s, and with an assumed right atrial pressure of 15 mmHg, the estimated right ventricular systolic  pressure is 42.7 mmHg. Left Atrium: Left atrial size was normal in size. Right Atrium: Right atrial size was normal in size. Pericardium: There is no evidence of pericardial effusion. Mitral Valve: The mitral valve is normal in structure. Normal mobility of the mitral valve  leaflets. Mild to moderate mitral annular calcification. Moderate mitral valve regurgitation. No evidence of mitral valve stenosis. Tricuspid Valve: The tricuspid valve is normal in structure. Tricuspid valve regurgitation is mild to moderate. No evidence of tricuspid stenosis. Aortic Valve: Aortic valve is mildly thickened; cannot exclude bicuspid morphology. The aortic valve is abnormal. Aortic valve regurgitation is not visualized. No aortic stenosis is present. Aortic valve mean gradient measures 2.0 mmHg. Aortic valve peak  gradient measures 4.7 mmHg. Aortic valve area, by VTI measures 1.47 cm. Pulmonic Valve: The pulmonic valve was not well visualized. Pulmonic valve regurgitation is trivial. No evidence of pulmonic stenosis. Aorta: The aortic root is normal in size and structure. Venous: The inferior vena cava is dilated in size with less than 50% respiratory variability, suggesting right atrial pressure of 15 mmHg. IAS/Shunts: No atrial level shunt detected by color flow Doppler.  LEFT VENTRICLE PLAX 2D LVIDd:         3.95 cm  Diastology LVIDs:         2.92 cm  LV e' lateral:   9.97 cm/s LV PW:         1.13 cm  LV E/e' lateral: 10.2 LV IVS:        1.32 cm  LV e' medial:    4.22 cm/s LVOT diam:     1.80 cm  LV E/e' medial:  24.2 LV SV:         22 LV SV Index:   14 LVOT Area:     2.54 cm  RIGHT VENTRICLE RV Mid diam:    2.53 cm RV S prime:     10.90 cm/s TAPSE (M-mode): 1.3 cm LEFT ATRIUM             Index       RIGHT ATRIUM           Index LA diam:        3.60 cm 2.32 cm/m  RA Area:     14.60 cm LA Vol (A2C):   46.3 ml 29.90 ml/m RA Volume:   32.00 ml  20.66 ml/m LA Vol (A4C):   43.8 ml 28.28 ml/m LA Biplane Vol: 45.1 ml 29.12 ml/m  AORTIC VALVE                   PULMONIC VALVE AV Area (Vmax):    1.32 cm    PV Vmax:        0.92 m/s AV Area (Vmean):   1.22 cm    PV Peak grad:   3.4 mmHg AV Area (VTI):     1.47 cm    RVOT Peak grad: 1 mmHg AV Vmax:           108.00 cm/s AV Vmean:          71.200 cm/s  AV VTI:            0.147 m AV Peak Grad:      4.7 mmHg AV Mean Grad:      2.0 mmHg LVOT Vmax:         56.10 cm/s LVOT Vmean:        34.000 cm/s LVOT VTI:          0.085 m  LVOT/AV VTI ratio: 0.58  AORTA Ao Root diam: 3.00 cm MITRAL VALVE                TRICUSPID VALVE MV Area (PHT): 5.75 cm     TR Peak grad:   27.7 mmHg MV Decel Time: 132 msec     TR Vmax:        263.00 cm/s MV E velocity: 102.00 cm/s MV A Prime:    4.0 cm/s     SHUNTS                             Systemic VTI:  0.09 m                             Systemic Diam: 1.80 cm Nelva Bush MD Electronically signed by Nelva Bush MD Signature Date/Time: 09/03/2019/7:33:41 PM    Final      Medications   Scheduled Meds: . apixaban  5 mg Oral BID  . B-complex with vitamin C  1 tablet Oral Daily  . diltiazem  120 mg Oral Daily  . [START ON 09/05/2019] furosemide  40 mg Oral BID  . insulin aspart  0-15 Units Subcutaneous TID WC  . metoprolol tartrate  75 mg Oral BID  . OXcarbazepine  150 mg Oral Daily  . pantoprazole  40 mg Oral BID  . polyethylene glycol  17 g Oral Daily  . potassium chloride  20 mEq Oral Daily  . pravastatin  20 mg Oral Daily  . sodium chloride flush  3 mL Intravenous Q12H  . traZODone  75 mg Oral QHS   Continuous Infusions: . sodium chloride         LOS: 2 days    Time spent: 30 minutes    Ezekiel Slocumb, DO [facility]  09/04/2019, 3:33 PM    If 7PM-7AM, please contact night-coverage. How to contact the [facility] Attending or Consulting provider [place] or covering provider during after hours [place], for this patient?    1. Check the care team in [facility] and look for a) attending/consulting [facility] provider listed and b) the [facility] team listed 2. Log into [email or web address] and use [place]'s universal password to access. If you do not have the password, please contact the hospital operator. 3. Locate the [facility] provider you are looking for under [facility] and page to a number that you can be directly  reached. 4. If you still have difficulty reaching the provider, please page the [facility] (Director on Call) for the Hospitalists listed on amion for assistance.

## 2019-09-04 NOTE — Hospital Course (Signed)
[name] is a 86 y.o. F with past medical history of anxiety, depression, CAD sp 3vCABG in 1998, chronic venous insufficiency, RIGHT lower extremity DVT, chronic HFpEF, PUD who presented to the ED on 09/02/19 with progressively worsening shortness of breath and lower extremity edema.  Also with reports of generalized weakness and frequent falls at home.  In the ED, chest x-ray showed cardiomegaly and mild pulmonary edema.  ECG showed atrial flutter.

## 2019-09-04 NOTE — Consult Note (Signed)
Cardiology Consultation:   [name] MRN: [id number]; DOB: [birth year]  Admit date: 09/02/2019 Date of Consult: 09/04/2019  Primary Care Provider: Ria Bush, MD Primary Cardiologist: Mertie Moores, MD  Primary Electrophysiologist:  None    Patient Profile:   [name] is a 86 y.o. female with a hx of CAD/CABG, persistent atrial fibrillation, heart failure preserved ejection fraction  who is being seen today for the evaluation of shortness of breath at the request of Dr. Arbutus Ped.  History of Present Illness:   Ms. [name] is a 86 year old female with history of CAD/CABG x3 in 75, heart failure preserved ejection fraction, last EF 50-55, persistent atrial fibrillation on Eliquis, DVT who presents to the hospital due to 3 days of worsening lower extremity edema.  Patient states taking all her medications as prescribed at home.  She is chronically on a water pill/Lasix.  Due to worsening swelling and redness in her legs, she was brought to the hospital by a friend.  She denies chest pain, shortness of breath, palpitations.  Since admission, patient was started on IV Lasix with improvement in symptoms.  She states being back to her regular self.  She is net -1.9 L over the past 24 hours.   Past Medical History:  Diagnosis Date  . Acute deep vein thrombosis (DVT) of distal vein of right lower extremity ([place]) 12/2013   Acute thrombus of R proximal gastrocnemius vein. Symptomatic provoked distal DVT  . Anxiety   . Basal cell carcinoma 10/30/2018   R nasal bridge, treated with Moh's  . Cellulitis of leg, left 01/16/2014  . Chronic back pain 03/2012   lumbar DDD with herniation - s/p L S1 transforaminal ESI (10/2012 Dr. Sharlet Salina)  . Chronic venous insufficiency   . Coronary artery disease    CABG 1998  . Cystocele   . Depression   . Diabetes mellitus    type 2, followed by endo.  . Diastolic dysfunction   . Diverticulosis   . Hemorrhoids   . History of  heart attack   . Hx of echocardiogram    a. Echo 10/13: mild LVH, EF 55-60%, Gr 1 diast dysfn, PASP 32  . Hyperlipidemia   . Hypertension   . Irritable bowel syndrome   . Microcytic anemia   . PAD (peripheral artery disease) ([place]) 2013   ABI: R 0.91, L 0.83  . Postmenopausal osteoporosis 01/2019   T score of -2.6  . PUD (peptic ulcer disease)   . Varicose veins   . Venous ulcer of leg ([place]) 12/12/2013   Required prolonged treatment by wound center  . Viral gastroenteritis due to [place] virus 05/13/2015   hospitalization    Past Surgical History:  Procedure Laterality Date  . ABDOMINAL HYSTERECTOMY  1975   TAH,BSO  . ABI  2013   R 0.91, L 0.83  . APPENDECTOMY     per pt report  . CARDIOVASCULAR STRESS TEST  2015   low risk myoview (Nahser)  . CATARACT SURGERY    . CHOLECYSTECTOMY    . COLONOSCOPY  02/2010   extensive diverticulosis throughout, small internal hemorrhoids, rec rpt 5 yrs (Dr. Allyn Kenner)  . CORONARY ARTERY BYPASS GRAFT  1998  . dexa scan  09/2011   femur -0.8, forearm 0.6 => normal  . ENDOVENOUS ABLATION SAPHENOUS VEIN W/ LASER Left 03/2014   Kellie Simmering  . ESI  2014   L S1 transforaminal ESI x2 (Chasnis) without improvement  . HAND SURGERY    .  lexiscan myoview  03/2011   Small basal inferolateral and anterolateral reversible perfusion defect suggests ischemia.  EF was normal.   old infarct, no new ischemia  . LOWER EXTREMITY ANGIOGRAPHY Right 09/06/2018   Procedure: LOWER EXTREMITY ANGIOGRAPHY;  Surgeon: Algernon Huxley, MD;  Location: [facility];  Service: Cardiovascular;  Laterality: Right;  . toe surgery    . TYMPANOPLASTY  1960s   R side  . Vault prolapse A & P repair  2009   Dr Rhodia Albright [facility] Medications:  Prior to Admission medications   Medication Sig Start Date End Date Taking? Authorizing Provider  ALPRAZolam Duanne Moron) 1 MG tablet Take 0.5 tablets (0.5 mg total) by mouth 4 (four) times daily as needed for anxiety. 07/26/19  Yes  Ria Bush, MD  apixaban (ELIQUIS) 5 MG TABS tablet Take 1 tablet (5 mg total) by mouth 2 (two) times daily. 12/13/18  Yes Nahser, Wonda Cheng, MD  B Complex-C (B COMPLEX-VITAMIN C PO) Take 1 tablet by mouth daily. +zinc    Yes [provider]  diclofenac sodium (VOLTAREN) 1 % GEL Apply 1 application topically 3 (three) times daily. 01/09/17  Yes Ria Bush, MD  diltiazem (DILACOR XR) 120 MG 24 hr capsule Take 1 capsule (120 mg total) by mouth daily. 06/04/19  Yes Ria Bush, MD  famotidine (PEPCID) 20 MG tablet Take 1 tablet (20 mg total) by mouth daily as needed for heartburn (breakthrough heartburn). 07/30/19  Yes Ria Bush, MD  furosemide (LASIX) 20 MG tablet Take 2 tablets (40 mg total) by mouth daily. With extra 40mg  in afternoon as needed for swelling 07/30/19  Yes Ria Bush, MD  loratadine (CLARITIN) 10 MG tablet Take 10 mg by mouth daily as needed for allergies.   Yes [provider]  metoprolol tartrate (LOPRESSOR) 50 MG tablet Take 50 mg by mouth 2 (two) times daily.  06/25/19  Yes Ria Bush, MD  Multiple Vitamins-Minerals (ICAPS) CAPS Take 1 capsule by mouth daily.   Yes [provider]  nystatin cream (MYCOSTATIN) Apply 1 application topically 2 (two) times daily. 09/04/18  Yes Ria Bush, MD  pantoprazole (PROTONIX) 40 MG tablet TAKE 1 TABLET BY MOUTH TWICE A DAY Patient taking differently: Take 40 mg by mouth daily.  06/27/19  Yes Ria Bush, MD  pioglitazone (ACTOS) 30 MG tablet TAKE 1 TABLET BY MOUTH EVERY DAY Patient taking differently: Take 30 mg by mouth daily.  01/28/19  Yes Elayne Snare, MD  potassium chloride (MICRO-K) 10 MEQ CR capsule Take 1 capsule (10 mEq total) by mouth daily for 3 days. 07/30/19 09/02/19 Yes Ria Bush, MD  pravastatin (PRAVACHOL) 20 MG tablet TAKE 1 TABLET BY MOUTH EVERY DAY Patient taking differently: Take 20 mg by mouth every evening.  08/19/19  Yes Ria Bush, MD    Probiotic Product (ALIGN) 4 MG CAPS Take 1 capsule (4 mg total) by mouth daily. 08/15/17  Yes Ria Bush, MD  sitaGLIPtin (JANUVIA) 100 MG tablet Take 1/2 tablet by mouth once daily. Patient taking differently: Take 50 mg by mouth daily. Take 1/2 tablet by mouth once daily. 07/15/19  Yes Elayne Snare, MD  traZODone (DESYREL) 150 MG tablet Take 0.5 tablets (75 mg total) by mouth at bedtime. 07/30/19  Yes Ria Bush, MD  vitamin E 1000 UNIT capsule Take 1,000 Units by mouth daily.   Yes [provider]  acetaminophen (TYLENOL) 500 MG tablet Take 500 mg by mouth every 6 (six) hours as needed  for mild pain.    [provider]  glucosamine-chondroitin 500-400 MG tablet Take 1 tablet by mouth daily.     [provider]  glucose blood (ONETOUCH VERIO) test strip USE TO TEST BLOOD SUGAR ONCE DAILY. DX:E11.9 06/11/19   Elayne Snare, MD  HYDROcodone-acetaminophen (NORCO/VICODIN) 5-325 MG tablet Take 0.5 tablets by mouth every 6 (six) hours as needed for moderate pain. Patient not taking: Reported on 09/02/2019 07/12/19   Ria Bush, MD  loperamide (IMODIUM) 2 MG capsule Take 2 mg by mouth 4 (four) times daily as needed for diarrhea or loose stools.    [provider]  nitroGLYCERIN (NITROSTAT) 0.4 MG SL tablet Place 1 tablet (0.4 mg total) under the tongue every 5 (five) minutes as needed for chest pain. 02/02/17   Nahser, Wonda Cheng, MD  nystatin-triamcinolone ointment Lilyan Gilford) Apply to affected area twice a day as needed for itching 07/26/19   [provider]  OXcarbazepine (TRILEPTAL) 150 MG tablet Take 150 mg by mouth daily. Every evening    [provider]  polyethylene glycol powder (GLYCOLAX/MIRALAX) powder Take 17 g by mouth daily. Hold for diarrhea 06/18/18   Ria Bush, MD    Inpatient Medications: Scheduled Meds: . apixaban  5 mg Oral BID  . B-complex with vitamin C  1 tablet Oral Daily  . diltiazem  120 mg Oral Daily  .  furosemide  40 mg Intravenous BID  . insulin aspart  0-15 Units Subcutaneous TID WC  . metoprolol tartrate  75 mg Oral BID  . OXcarbazepine  150 mg Oral Daily  . pantoprazole  40 mg Oral BID  . polyethylene glycol  17 g Oral Daily  . potassium chloride  20 mEq Oral Daily  . pravastatin  20 mg Oral Daily  . sodium chloride flush  3 mL Intravenous Q12H  . traZODone  75 mg Oral QHS   Continuous Infusions: . sodium chloride     PRN Meds: sodium chloride, acetaminophen, ALPRAZolam, HYDROcodone-acetaminophen, loperamide, loratadine, nitroGLYCERIN, ondansetron (ZOFRAN) IV, sodium chloride flush  Allergies:    Allergies  Allergen Reactions  . Metformin And Related Diarrhea    Social History:   Social History   Socioeconomic History  . Marital status: Single    Spouse name: Not on file  . Number of children: Not on file  . Years of education: Not on file  . Highest education level: Not on file  Occupational History  . Occupation: Waitress   Tobacco Use  . Smoking status: Former Smoker    Quit date: 03/05/1979    Years since quitting: 40.5  . Smokeless tobacco: Never Used  Substance and Sexual Activity  . Alcohol use: No    Alcohol/week: 0.0 standard drinks  . Drug use: No  . Sexual activity: Not Currently    Comment: 1st intercourse 86yo.--- 2 sexual partners  Other Topics Concern  . 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   Independent for all ADLs, lives with a boyfriend.     Social Determinants of Health   Financial Resource Strain: Low Risk   . Difficulty of Paying Living Expenses: Not hard at all  Food Insecurity: No Food Insecurity  . Worried About Charity fundraiser in the Last Year: Never true  . Ran Out of Food in the Last Year: Never true  Transportation Needs: No Transportation Needs  . Lack of Transportation (Medical): No  . Lack of Transportation (Non-Medical):  No  Physical Activity: Inactive  . Days of  Exercise per Week: 0 days  . Minutes of Exercise per Session: 0 min  Stress: Stress Concern Present  . Feeling of Stress : Very much  Social Connections: Unknown  . Frequency of Communication with Friends and Family: More than three times a week  . Frequency of Social Gatherings with Friends and Family: Not on file  . Attends Religious Services: Not on file  . Active Member of Clubs or Organizations: Not on file  . Attends Archivist Meetings: Not on file  . Marital Status: Not on file  Intimate Partner Violence: Not At Risk  . Fear of Current or Ex-Partner: No  . Emotionally Abused: No  . Physically Abused: No  . Sexually Abused: No    Family History:    Family History  Problem Relation Age of Onset  . Hypertension Mother   . Stroke Mother   . Diabetes Father   . Diabetes Sister   . Diabetes Brother   . Heart disease Son   . Colon cancer Neg Hx      ROS:  Please see the history of present illness.   All other ROS reviewed and negative.     Physical Exam/Data:   Vitals:   09/03/19 1947 09/04/19 0506 09/04/19 0744 09/04/19 1151  BP: (!) 112/58 134/74 (!) 135/91 113/60  Pulse: 75 92 100 (!) 55  Resp: 20 19 18 18   Temp: 98.5 F (36.9 C) 98 F (36.7 C) 98.4 F (36.9 C) 98 F (36.7 C)  TempSrc: Oral Oral Oral   SpO2: 94% 94% 93% 94%  Weight:  61 kg    Height:        Intake/Output Summary (Last 24 hours) at 09/04/2019 1206 Last data filed at 09/04/2019 1004 Gross per 24 hour  Intake 600 ml  Output 1950 ml  Net -1350 ml   Last 3 Weights 09/04/2019 09/03/2019 09/02/2019  Weight (lbs) 134 lb 6.4 oz 142 lb 3.2 oz 140 lb 10.5 oz  Weight (kg) 60.963 kg 64.501 kg 63.8 kg     Body mass index is 29.08 kg/m.  General:  Well nourished, well developed, in no acute distress HEENT: normal Lymph: no adenopathy Neck: no JVD Endocrine:  No thryomegaly Vascular: No carotid bruits; FA pulses 2+ bilaterally without bruits  Cardiac: Irregular irregular, no  murmurs Lungs:  clear to auscultation bilaterally, no wheezing, rhonchi or rales  Abd: soft, nontender, no hepatomegaly  Ext: no edema Musculoskeletal:  No deformities, BUE and BLE strength normal and equal Skin: warm and dry  Neuro:  CNs 2-12 intact, no focal abnormalities noted Psych:  Normal affect   EKG:  The EKG was personally reviewed and demonstrates: Atrial fibrillation Telemetry:  Telemetry was personally reviewed and demonstrates: Atrial fibrillation heart rate 73  Relevant CV Studies:  TTE 08/2019 1. Left ventricular ejection fraction, by estimation, is 50 to 55%. The  left ventricle has low normal function. The left ventricle has no regional  wall motion abnormalities. Left ventricular diastolic parameters are  indeterminate.  2. Right ventricular systolic function is moderately reduced. The right  ventricular size is normal. There is moderately elevated pulmonary artery  systolic pressure.  3. The mitral valve is normal in structure. Moderate mitral valve  regurgitation. No evidence of mitral stenosis.  4. Tricuspid valve regurgitation is mild to moderate.  5. Aortic valve is mildly thickened; cannot exclude bicuspid morphology.  The aortic valve is abnormal. Aortic valve regurgitation  is not  visualized. No aortic stenosis is present.  6. The inferior vena cava is dilated in size with <50% respiratory  variability, suggesting right atrial pressure of 15 mmHg.   Laboratory Data:  High Sensitivity Troponin:   Recent Labs  Lab 09/02/19 1100 09/02/19 1329  TROPONINIHS 11 11     Chemistry Recent Labs  Lab 09/02/19 1100 09/03/19 0616 09/04/19 0555  NA 138 140 140  K 3.6 3.5 3.4*  CL 98 99 99  CO2 30 31 30   GLUCOSE 246* 121* 141*  BUN 18 18 23   CREATININE 0.96 1.00 0.92  CALCIUM 9.0 8.5* 8.7*  GFRNONAA 52* 50* 55*  GFRAA >60 57* >60  ANIONGAP 10 10 11     Recent Labs  Lab 09/02/19 1100 09/04/19 0555  PROT 7.1 6.5  ALBUMIN 3.4* 3.1*  AST 23 21   ALT 17 15  ALKPHOS 75 78  BILITOT 1.3* 1.3*   Hematology Recent Labs  Lab 09/02/19 1100 09/04/19 0555  WBC 8.1 8.8  RBC 4.25 4.23  HGB 12.5 12.4  HCT 39.8 38.0  MCV 93.6 89.8  MCH 29.4 29.3  MCHC 31.4 32.6  RDW 15.9* 16.0*  PLT 263 238   BNP Recent Labs  Lab 09/02/19 1100  BNP 409.8*    DDimer No results for input(s): DDIMER in the last 168 hours.   Radiology/Studies:  DG Chest 2 View  Result Date: 09/02/2019 CLINICAL DATA:  Chest pain EXAM: CHEST - 2 VIEW COMPARISON:  July 16, 2019 FINDINGS: Stable cardiomegaly. The hila and mediastinum are unchanged. No pneumothorax. No nodules or masses. Mild increased interstitial markings in the lungs suggestive of mild pulmonary edema. No focal infiltrates. IMPRESSION: Cardiomegaly and suggested mild pulmonary edema. Electronically Signed   By: Dorise Bullion III M.D   On: 09/02/2019 11:22   US Venous Img Lower Bilateral (DVT)  Result Date: 09/04/2019 CLINICAL DATA:  Bilateral lower extremity swelling EXAM: BILATERAL LOWER EXTREMITY VENOUS DOPPLER ULTRASOUND TECHNIQUE: Gray-scale sonography with compression, as well as color and duplex ultrasound, were performed to evaluate the deep venous system(s) from the level of the common femoral vein through the popliteal and proximal calf veins. COMPARISON:  None. FINDINGS: VENOUS Normal compressibility of the common femoral, superficial femoral, and popliteal veins, as well as the visualized calf veins. Visualized portions of profunda femoral vein and great saphenous vein unremarkable. No filling defects to suggest DVT on grayscale or color Doppler imaging. Doppler waveforms show normal direction of venous flow, normal respiratory plasticity and response to augmentation. Limited views of the contralateral common femoral vein are unremarkable. OTHER None. Limitations: none IMPRESSION: No evidence of deep venous thrombosis bilaterally. Electronically Signed   By: Inez Catalina M.D.   On: 09/04/2019  02:00   ECHOCARDIOGRAM COMPLETE  Result Date: 09/03/2019    ECHOCARDIOGRAM REPORT   Patient Name:   ALMETTA LIDDICOAT Stell Date of Exam: 09/02/2019 Medical Rec #:  [id number]        Height:       57.0 in Accession #:    [id number]       Weight:       140.7 lb Date of Birth:  [birth year]         BSA:          1.549 m Patient Age:    25 years         BP:           132/83 mmHg Patient Gender: F  HR:           116 bpm. Exam Location:  [facility] Procedure: 2D Echo, Cardiac Doppler and Color Doppler Indications:     Congestive Heart Failure 428.0 / I50.9  History:         Patient has prior history of Echocardiogram examinations. CAD;                  Risk Factors:Hypertension.  Sonographer:     Alyse Low Roar Referring Phys:  [id number] AGBATA Diagnosing Phys: Nelva Bush MD IMPRESSIONS  1. Left ventricular ejection fraction, by estimation, is 50 to 55%. The left ventricle has low normal function. The left ventricle has no regional wall motion abnormalities. Left ventricular diastolic parameters are indeterminate.  2. Right ventricular systolic function is moderately reduced. The right ventricular size is normal. There is moderately elevated pulmonary artery systolic pressure.  3. The mitral valve is normal in structure. Moderate mitral valve regurgitation. No evidence of mitral stenosis.  4. Tricuspid valve regurgitation is mild to moderate.  5. Aortic valve is mildly thickened; cannot exclude bicuspid morphology. The aortic valve is abnormal. Aortic valve regurgitation is not visualized. No aortic stenosis is present.  6. The inferior vena cava is dilated in size with <50% respiratory variability, suggesting right atrial pressure of 15 mmHg. FINDINGS  Left Ventricle: Left ventricular ejection fraction, by estimation, is 50 to 55%. The left ventricle has low normal function. The left ventricle has no regional wall motion abnormalities. The left ventricular internal cavity size was normal in size. There is no  left ventricular hypertrophy. Left ventricular diastolic parameters are indeterminate. Right Ventricle: The right ventricular size is normal. No increase in right ventricular wall thickness. Right ventricular systolic function is moderately reduced. There is moderately elevated pulmonary artery systolic pressure. The tricuspid regurgitant velocity is 2.63 m/s, and with an assumed right atrial pressure of 15 mmHg, the estimated right ventricular systolic pressure is 33.8 mmHg. Left Atrium: Left atrial size was normal in size. Right Atrium: Right atrial size was normal in size. Pericardium: There is no evidence of pericardial effusion. Mitral Valve: The mitral valve is normal in structure. Normal mobility of the mitral valve leaflets. Mild to moderate mitral annular calcification. Moderate mitral valve regurgitation. No evidence of mitral valve stenosis. Tricuspid Valve: The tricuspid valve is normal in structure. Tricuspid valve regurgitation is mild to moderate. No evidence of tricuspid stenosis. Aortic Valve: Aortic valve is mildly thickened; cannot exclude bicuspid morphology. The aortic valve is abnormal. Aortic valve regurgitation is not visualized. No aortic stenosis is present. Aortic valve mean gradient measures 2.0 mmHg. Aortic valve peak  gradient measures 4.7 mmHg. Aortic valve area, by VTI measures 1.47 cm. Pulmonic Valve: The pulmonic valve was not well visualized. Pulmonic valve regurgitation is trivial. No evidence of pulmonic stenosis. Aorta: The aortic root is normal in size and structure. Venous: The inferior vena cava is dilated in size with less than 50% respiratory variability, suggesting right atrial pressure of 15 mmHg. IAS/Shunts: No atrial level shunt detected by color flow Doppler.  LEFT VENTRICLE PLAX 2D LVIDd:         3.95 cm  Diastology LVIDs:         2.92 cm  LV e' lateral:   9.97 cm/s LV PW:         1.13 cm  LV E/e' lateral: 10.2 LV IVS:        1.32 cm  LV e' medial:    4.22 cm/s LVOT  diam:     1.80 cm  LV E/e' medial:  24.2 LV SV:         22 LV SV Index:   14 LVOT Area:     2.54 cm  RIGHT VENTRICLE RV Mid diam:    2.53 cm RV S prime:     10.90 cm/s TAPSE (M-mode): 1.3 cm LEFT ATRIUM             Index       RIGHT ATRIUM           Index LA diam:        3.60 cm 2.32 cm/m  RA Area:     14.60 cm LA Vol (A2C):   46.3 ml 29.90 ml/m RA Volume:   32.00 ml  20.66 ml/m LA Vol (A4C):   43.8 ml 28.28 ml/m LA Biplane Vol: 45.1 ml 29.12 ml/m  AORTIC VALVE                   PULMONIC VALVE AV Area (Vmax):    1.32 cm    PV Vmax:        0.92 m/s AV Area (Vmean):   1.22 cm    PV Peak grad:   3.4 mmHg AV Area (VTI):     1.47 cm    RVOT Peak grad: 1 mmHg AV Vmax:           108.00 cm/s AV Vmean:          71.200 cm/s AV VTI:            0.147 m AV Peak Grad:      4.7 mmHg AV Mean Grad:      2.0 mmHg LVOT Vmax:         56.10 cm/s LVOT Vmean:        34.000 cm/s LVOT VTI:          0.085 m LVOT/AV VTI ratio: 0.58  AORTA Ao Root diam: 3.00 cm MITRAL VALVE                TRICUSPID VALVE MV Area (PHT): 5.75 cm     TR Peak grad:   27.7 mmHg MV Decel Time: 132 msec     TR Vmax:        263.00 cm/s MV E velocity: 102.00 cm/s MV A Prime:    4.0 cm/s     SHUNTS                             Systemic VTI:  0.09 m                             Systemic Diam: 1.80 cm Nelva Bush MD Electronically signed by Nelva Bush MD Signature Date/Time: 09/03/2019/7:33:41 PM    Final      Assessment and Plan:   1. HFpEF, edema -Patient appears euvolemic without edema. -Stop IV Lasix.  Start p.o. Lasix 40 mg twice daily -Creatinine is normal, monitor K and replete lytes  2.  Persistent atrial fibrillation -Heart rate controlled -Eliquis 5 twice daily, Lopressor, Cardizem CD  3.  History of CAD/CABG -Asymptomatic -Beta-blocker, on Eliquis.  If patient stays euvolemic on oral Lasix, can be discharged on oral Lasix as prescribed above for close outpatient follow-up.  Signed, Kate Sable, MD  09/04/2019 12:06  PM

## 2019-09-05 ENCOUNTER — Telehealth: Payer: Self-pay

## 2019-09-05 DIAGNOSIS — I4819 Other persistent atrial fibrillation: Secondary | ICD-10-CM

## 2019-09-05 LAB — CBC WITH DIFFERENTIAL/PLATELET
Abs Immature Granulocytes: 0.04 10*3/uL (ref 0.00–0.07)
Basophils Absolute: 0.1 10*3/uL (ref 0.0–0.1)
Basophils Relative: 1 %
Eosinophils Absolute: 0.3 10*3/uL (ref 0.0–0.5)
Eosinophils Relative: 3 %
HCT: 38.1 % (ref 36.0–46.0)
Hemoglobin: 12.3 g/dL (ref 12.0–15.0)
Immature Granulocytes: 1 %
Lymphocytes Relative: 16 %
Lymphs Abs: 1.3 10*3/uL (ref 0.7–4.0)
MCH: 29.5 pg (ref 26.0–34.0)
MCHC: 32.3 g/dL (ref 30.0–36.0)
MCV: 91.4 fL (ref 80.0–100.0)
Monocytes Absolute: 0.9 10*3/uL (ref 0.1–1.0)
Monocytes Relative: 11 %
Neutro Abs: 5.7 10*3/uL (ref 1.7–7.7)
Neutrophils Relative %: 68 %
Platelets: 239 10*3/uL (ref 150–400)
RBC: 4.17 MIL/uL (ref 3.87–5.11)
RDW: 16.3 % — ABNORMAL HIGH (ref 11.5–15.5)
WBC: 8.3 10*3/uL (ref 4.0–10.5)
nRBC: 0 % (ref 0.0–0.2)

## 2019-09-05 LAB — COMPREHENSIVE METABOLIC PANEL
ALT: 16 U/L (ref 0–44)
AST: 21 U/L (ref 15–41)
Albumin: 2.9 g/dL — ABNORMAL LOW (ref 3.5–5.0)
Alkaline Phosphatase: 67 U/L (ref 38–126)
Anion gap: 9 (ref 5–15)
BUN: 30 mg/dL — ABNORMAL HIGH (ref 8–23)
CO2: 30 mmol/L (ref 22–32)
Calcium: 9.1 mg/dL (ref 8.9–10.3)
Chloride: 100 mmol/L (ref 98–111)
Creatinine, Ser: 1.01 mg/dL — ABNORMAL HIGH (ref 0.44–1.00)
GFR calc Af Amer: 57 mL/min — ABNORMAL LOW (ref >60)
GFR calc non Af Amer: 49 mL/min — ABNORMAL LOW (ref >60)
Glucose, Bld: 140 mg/dL — ABNORMAL HIGH (ref 70–99)
Potassium: 4.5 mmol/L (ref 3.5–5.1)
Sodium: 139 mmol/L (ref 135–145)
Total Bilirubin: 1.3 mg/dL — ABNORMAL HIGH (ref 0.3–1.2)
Total Protein: 6.4 g/dL — ABNORMAL LOW (ref 6.5–8.1)

## 2019-09-05 LAB — PHOSPHORUS: Phosphorus: 3.7 mg/dL (ref 2.5–4.6)

## 2019-09-05 LAB — GLUCOSE, CAPILLARY: Glucose-Capillary: 135 mg/dL — ABNORMAL HIGH (ref 70–99)

## 2019-09-05 LAB — MAGNESIUM: Magnesium: 2.2 mg/dL (ref 1.7–2.4)

## 2019-09-05 MED ORDER — FUROSEMIDE 40 MG PO TABS: 40.0000 mg | ORAL_TABLET | Freq: Two times a day (BID) | ORAL | 0 refills | Status: DC

## 2019-09-05 NOTE — Telephone Encounter (Signed)
1st attempt- Called to complete TCM. Received no answer and voicemail box is not setup. Hospital follow up scheduled 09/10/2019 @ 3 pm.

## 2019-09-05 NOTE — Discharge Summary (Signed)
Physician Discharge Summary  [name] FYB:[id number] DOB: 1927-12-27 DOA: 09/02/2019  PCP: Ria Bush, MD  Admit date: 09/02/2019 Discharge date: 09/17/2019  Admitted From: home Disposition:  home  Recommendations for Outpatient Follow-up:  1. Follow up with PCP in 1-2 weeks 2. Please obtain BMP/CBC in one week 3. Please follow up closely with cardiology  Home Health: ues Equipment/Devices: PT, OT, RN, SW, Aide  Discharge Condition: Stable CODE STATUS:  Full Diet recommendation: Heart Healthy / Carb Modified   Discharge Diagnoses: Principal Problem:   (HFpEF) heart failure with preserved ejection fraction (HCC) Active Problems:   Coronary artery disease   GAD (generalized anxiety disorder)   Hypertension   Paroxysmal atrial fibrillation ([place])   CKD stage 4 due to type 2 diabetes mellitus ([place])   CHF (congestive heart failure), NYHA class I, acute on chronic, diastolic (HCC)   Persistent atrial fibrillation ([place])    Summary of HPI and Hospital Course:  [name] is a 86 y.o. F with past medical history of anxiety, depression, CAD sp 3vCABG in 1998, chronic venous insufficiency, RIGHT lower extremity DVT, chronic HFpEF, PUD who presented to the ED on 09/02/19 with progressively worsening shortness of breath and lower extremity edema.  Also with reports of generalized weakness and frequent falls at home.  In the ED, chest x-ray showed cardiomegaly and mild pulmonary edema.  ECG showed atrial flutter.    Acute on chronic HFpEF -present on admission with shortness of breath and increased lower extremity edema.  Improved with IV diuresis.  Echo EF 55-60%.  Cardiology consulted.  Transitioned to PO Lasix 40 mg PO BID.  Continue daily weights.  Close cardiology follow up.  Atrial flutter -, present on admission, rate controlled.  Continue Eliquis, metoprolol, Cardizem.    Essential hypertension -chronic, stable.  Continue meds as above.  Type 2 diabetes  -uncontrolled, A1c 7.4%.  Appears she takes Actos and Januvia at home.  Hold home meds.  Moderate sliding scale NovoLog.  RULED OUT: CKD stage IV -noted in prior documentation this admission.  Ruled out.  Patient does not have CKD stage IV.  It appears her baseline renal function is normal.  Coronary artery disease -history of three-vessel CABG in 1998.  No active chest pain.  Continue beta-blocker and statin.  Stat EKG and troponins if any active chest pain.  Depression/anxiety -chronic, stable.  Continue home Xanax as needed, trazodone at bedtime.  Right lower extremity pain -present on admission, resolved.  Right lower extremity Doppler ultrasound was negative for DVTs bilaterally.  PT OT evaluations, home health recommended.  Set up for [facility] PT and OT.  Patient BMI: Body mass index is 29.08 kg/m.  Recommend weight loss.  Complicates overall care and prognosis.   Discharge Instructions   Discharge Instructions    (HEART FAILURE PATIENTS) Call MD:  Anytime you have any of the following symptoms: 1) 3 pound weight gain in 24 hours or 5 pounds in 1 week 2) shortness of breath, with or without a dry hacking cough 3) swelling in the hands, feet or stomach 4) if you have to sleep on extra pillows at night in order to breathe.   Complete by: As directed    Call MD for:  extreme fatigue   Complete by: As directed    Call MD for:  persistant dizziness or light-headedness   Complete by: As directed    Call MD for:  temperature >100.4   Complete by: As directed    Diet -  low sodium heart healthy   Complete by: As directed    Increase activity slowly   Complete by: As directed      Allergies as of 09/05/2019      Reactions   Metformin And Related Diarrhea      Medication List    TAKE these medications   acetaminophen 500 MG tablet Commonly known as: TYLENOL Take 500 mg by mouth every 6 (six) hours as needed for mild pain.   Align 4 MG Caps Take 1 capsule (4 mg total) by mouth  daily.   ALPRAZolam 1 MG tablet Commonly known as: XANAX Take 0.5 tablets (0.5 mg total) by mouth 4 (four) times daily as needed for anxiety.   apixaban 5 MG Tabs tablet Commonly known as: Eliquis Take 1 tablet (5 mg total) by mouth 2 (two) times daily.   B COMPLEX-VITAMIN C PO Take 1 tablet by mouth daily. +zinc   diclofenac sodium 1 % Gel Commonly known as: VOLTAREN Apply 1 application topically 3 (three) times daily.   diltiazem 120 MG 24 hr capsule Commonly known as: DILACOR XR Take 1 capsule (120 mg total) by mouth daily.   famotidine 20 MG tablet Commonly known as: Pepcid Take 1 tablet (20 mg total) by mouth daily as needed for heartburn (breakthrough heartburn).   furosemide 40 MG tablet Commonly known as: LASIX Take 1 tablet (40 mg total) by mouth 2 (two) times daily. What changed:   medication strength  when to take this  additional instructions   glucosamine-chondroitin 500-400 MG tablet Take 1 tablet by mouth daily.   ICaps Caps Take 1 capsule by mouth daily.   loperamide 2 MG capsule Commonly known as: IMODIUM Take 2 mg by mouth 4 (four) times daily as needed for diarrhea or loose stools.   loratadine 10 MG tablet Commonly known as: CLARITIN Take 10 mg by mouth daily as needed for allergies.   metoprolol tartrate 50 MG tablet Commonly known as: LOPRESSOR Take 50 mg by mouth 2 (two) times daily.   nitroGLYCERIN 0.4 MG SL tablet Commonly known as: NITROSTAT Place 1 tablet (0.4 mg total) under the tongue every 5 (five) minutes as needed for chest pain.   nystatin-triamcinolone ointment Commonly known as: MYCOLOG Apply to affected area twice a day as needed for itching   OneTouch Verio test strip Generic drug: glucose blood USE TO TEST BLOOD SUGAR ONCE DAILY. DX:E11.9   OXcarbazepine 150 MG tablet Commonly known as: TRILEPTAL Take 150 mg by mouth daily. Every evening   pantoprazole 40 MG tablet Commonly known as: PROTONIX TAKE 1 TABLET  BY MOUTH TWICE A DAY What changed: when to take this   polyethylene glycol powder 17 GM/SCOOP powder Commonly known as: GLYCOLAX/MIRALAX Take 17 g by mouth daily. Hold for diarrhea   potassium chloride 10 MEQ CR capsule Commonly known as: MICRO-K Take 1 capsule (10 mEq total) by mouth daily for 3 days.   pravastatin 20 MG tablet Commonly known as: PRAVACHOL TAKE 1 TABLET BY MOUTH EVERY DAY What changed: when to take this   traZODone 150 MG tablet Commonly known as: DESYREL Take 0.5 tablets (75 mg total) by mouth at bedtime.   vitamin E 1000 UNIT capsule Take 1,000 Units by mouth daily.       Follow-up Information    [facility] Follow up on 09/11/2019.   Specialty: Cardiology Why: at 12:30pm. Enter through the [place] entrance Contact information: [place]  [address] [place] [phone]       Ria Bush, MD. Go on 09/10/2019.   Specialty: Family Medicine Why: 3:00pm Please arrive 15 minutes early for check in. Contact information: [place] [address] [phone]        Nahser, Wonda Cheng, MD. Go on 09/25/2019.   Specialty: Cardiology Why: 8:40am Contact information: [place] [address] [place] [address] [phone]        [facility] Follow [facility].   Specialty: Home Health Services Why: Physical Therapy, Occupational Therapy, Nurse, Nurse Aid, and Social Worker             Allergies  Allergen Reactions  . Metformin And Related Diarrhea    Consultations:  Cardiology   Procedures/Studies: DG Chest 2 View  Result Date: 09/02/2019 CLINICAL DATA:  Chest pain EXAM: CHEST - 2 VIEW COMPARISON:  July 16, 2019 FINDINGS: Stable cardiomegaly. The hila and mediastinum are unchanged. No pneumothorax. No nodules or masses. Mild increased interstitial markings in the lungs suggestive of mild pulmonary edema. No  focal infiltrates. IMPRESSION: Cardiomegaly and suggested mild pulmonary edema. Electronically Signed   By: Dorise Bullion III M.D   On: 09/02/2019 11:22   US Venous Img Lower Bilateral (DVT)  Result Date: 09/04/2019 CLINICAL DATA:  Bilateral lower extremity swelling EXAM: BILATERAL LOWER EXTREMITY VENOUS DOPPLER ULTRASOUND TECHNIQUE: Gray-scale sonography with compression, as well as color and duplex ultrasound, were performed to evaluate the deep venous system(s) from the level of the common femoral vein through the popliteal and proximal calf veins. COMPARISON:  None. FINDINGS: VENOUS Normal compressibility of the common femoral, superficial femoral, and popliteal veins, as well as the visualized calf veins. Visualized portions of profunda femoral vein and great saphenous vein unremarkable. No filling defects to suggest DVT on grayscale or color Doppler imaging. Doppler waveforms show normal direction of venous flow, normal respiratory plasticity and response to augmentation. Limited views of the contralateral common femoral vein are unremarkable. OTHER None. Limitations: none IMPRESSION: No evidence of deep venous thrombosis bilaterally. Electronically Signed   By: Inez Catalina M.D.   On: 09/04/2019 02:00   ECHOCARDIOGRAM COMPLETE  Result Date: 09/03/2019    ECHOCARDIOGRAM REPORT   Patient Name:   SHARLETT LIENEMANN Pledger Date of Exam: 09/02/2019 Medical Rec #:  [id number]        Height:       57.0 in Accession #:    [id number]       Weight:       140.7 lb Date of Birth:  1927/06/05         BSA:          1.549 m Patient Age:    73 years         BP:           132/83 mmHg Patient Gender: F                HR:           116 bpm. Exam Location:  [facility] Procedure: 2D Echo, Cardiac Doppler and Color Doppler Indications:     Congestive Heart Failure 428.0 / I50.9  History:         Patient has prior history of Echocardiogram examinations. CAD;                  Risk Factors:Hypertension.  Sonographer:     Alyse Low [place] Referring  Phys:  [place] Diagnosing Phys:  Nelva Bush MD IMPRESSIONS  1. Left ventricular ejection fraction, by estimation, is 50 to 55%. The left ventricle has low normal function. The left ventricle has no regional wall motion abnormalities. Left ventricular diastolic parameters are indeterminate.  2. Right ventricular systolic function is moderately reduced. The right ventricular size is normal. There is moderately elevated pulmonary artery systolic pressure.  3. The mitral valve is normal in structure. Moderate mitral valve regurgitation. No evidence of mitral stenosis.  4. Tricuspid valve regurgitation is mild to moderate.  5. Aortic valve is mildly thickened; cannot exclude bicuspid morphology. The aortic valve is abnormal. Aortic valve regurgitation is not visualized. No aortic stenosis is present.  6. The inferior vena cava is dilated in size with <50% respiratory variability, suggesting right atrial pressure of 15 mmHg. FINDINGS  Left Ventricle: Left ventricular ejection fraction, by estimation, is 50 to 55%. The left ventricle has low normal function. The left ventricle has no regional wall motion abnormalities. The left ventricular internal cavity size was normal in size. There is no left ventricular hypertrophy. Left ventricular diastolic parameters are indeterminate. Right Ventricle: The right ventricular size is normal. No increase in right ventricular wall thickness. Right ventricular systolic function is moderately reduced. There is moderately elevated pulmonary artery systolic pressure. The tricuspid regurgitant velocity is 2.63 m/s, and with an assumed right atrial pressure of 15 mmHg, the estimated right ventricular systolic pressure is 81.8 mmHg. Left Atrium: Left atrial size was normal in size. Right Atrium: Right atrial size was normal in size. Pericardium: There is no evidence of pericardial effusion. Mitral Valve: The mitral valve is normal in structure. Normal mobility of the mitral  valve leaflets. Mild to moderate mitral annular calcification. Moderate mitral valve regurgitation. No evidence of mitral valve stenosis. Tricuspid Valve: The tricuspid valve is normal in structure. Tricuspid valve regurgitation is mild to moderate. No evidence of tricuspid stenosis. Aortic Valve: Aortic valve is mildly thickened; cannot exclude bicuspid morphology. The aortic valve is abnormal. Aortic valve regurgitation is not visualized. No aortic stenosis is present. Aortic valve mean gradient measures 2.0 mmHg. Aortic valve peak  gradient measures 4.7 mmHg. Aortic valve area, by VTI measures 1.47 cm. Pulmonic Valve: The pulmonic valve was not well visualized. Pulmonic valve regurgitation is trivial. No evidence of pulmonic stenosis. Aorta: The aortic root is normal in size and structure. Venous: The inferior vena cava is dilated in size with less than 50% respiratory variability, suggesting right atrial pressure of 15 mmHg. IAS/Shunts: No atrial level shunt detected by color flow Doppler.  LEFT VENTRICLE PLAX 2D LVIDd:         3.95 cm  Diastology LVIDs:         2.92 cm  LV e' lateral:   9.97 cm/s LV PW:         1.13 cm  LV E/e' lateral: 10.2 LV IVS:        1.32 cm  LV e' medial:    4.22 cm/s LVOT diam:     1.80 cm  LV E/e' medial:  24.2 LV SV:         22 LV SV Index:   14 LVOT Area:     2.54 cm  RIGHT VENTRICLE RV Mid diam:    2.53 cm RV S prime:     10.90 cm/s TAPSE (M-mode): 1.3 cm LEFT ATRIUM             Index       RIGHT ATRIUM  Index LA diam:        3.60 cm 2.32 cm/m  RA Area:     14.60 cm LA Vol (A2C):   46.3 ml 29.90 ml/m RA Volume:   32.00 ml  20.66 ml/m LA Vol (A4C):   43.8 ml 28.28 ml/m LA Biplane Vol: 45.1 ml 29.12 ml/m  AORTIC VALVE                   PULMONIC VALVE AV Area (Vmax):    1.32 cm    PV Vmax:        0.92 m/s AV Area (Vmean):   1.22 cm    PV Peak grad:   3.4 mmHg AV Area (VTI):     1.47 cm    RVOT Peak grad: 1 mmHg AV Vmax:           108.00 cm/s AV Vmean:           71.200 cm/s AV VTI:            0.147 m AV Peak Grad:      4.7 mmHg AV Mean Grad:      2.0 mmHg LVOT Vmax:         56.10 cm/s LVOT Vmean:        34.000 cm/s LVOT VTI:          0.085 m LVOT/AV VTI ratio: 0.58  AORTA Ao Root diam: 3.00 cm MITRAL VALVE                TRICUSPID VALVE MV Area (PHT): 5.75 cm     TR Peak grad:   27.7 mmHg MV Decel Time: 132 msec     TR Vmax:        263.00 cm/s MV E velocity: 102.00 cm/s MV A Prime:    4.0 cm/s     SHUNTS                             Systemic VTI:  0.09 m                             Systemic Diam: 1.80 cm Nelva Bush MD Electronically signed by Nelva Bush MD Signature Date/Time: 09/03/2019/7:33:41 PM    Final        Subjective: Patient seen up in chair.  Reports feeling well.  No chest pain or SOB.  Does feel tired.  No fever chills or other complaints.     Discharge Exam: Vitals:   09/05/19 0424 09/05/19 0754  BP: 123/68 122/80  Pulse: 84 (!) 101  Resp: 18 18  Temp: 98.4 F (36.9 C) 98.3 F (36.8 C)  SpO2: 94% 92%   Vitals:   09/04/19 1637 09/04/19 2156 09/05/19 0424 09/05/19 0754  BP: 121/60 108/66 123/68 122/80  Pulse: 66 88 84 (!) 101  Resp: 19 20 18 18   Temp: 98.3 F (36.8 C) 98.3 F (36.8 C) 98.4 F (36.9 C) 98.3 F (36.8 C)  TempSrc: Oral Oral Oral Oral  SpO2: 93% 93% 94% 92%  Weight:   60.4 kg   Height:        General: Pt is alert, awake, not in acute distress Cardiovascular: RRR, S1/S2 +, no rubs, no gallops Respiratory: CTA bilaterally, no wheezing, no rhonchi Abdominal: Soft, NT, ND, bowel sounds + Extremities: no edema, no cyanosis    The results of significant diagnostics from this hospitalization (  including imaging, microbiology, ancillary and laboratory) are listed below for reference.     Microbiology: No results found for this or any previous visit (from the past 240 hour(s)).   Labs: BNP (last 3 results) Recent Labs    02/14/19 1415 07/16/19 1449 09/02/19 1100  BNP 323.0* 430.0* 409.8*    Basic Metabolic Panel: No results for input(s): NA, K, CL, CO2, GLUCOSE, BUN, CREATININE, CALCIUM, MG, PHOS in the last 168 hours. Liver Function Tests: No results for input(s): AST, ALT, ALKPHOS, BILITOT, PROT, ALBUMIN in the last 168 hours. No results for input(s): LIPASE, AMYLASE in the last 168 hours. No results for input(s): AMMONIA in the last 168 hours. CBC: No results for input(s): WBC, NEUTROABS, HGB, HCT, MCV, PLT in the last 168 hours. Cardiac Enzymes: No results for input(s): CKTOTAL, CKMB, CKMBINDEX, TROPONINI in the last 168 hours. BNP: Invalid input(s): POCBNP CBG: Recent Labs  Lab 09/11/19 1242  GLUCAP 124*   D-Dimer No results for input(s): DDIMER in the last 72 hours. Hgb A1c No results for input(s): HGBA1C in the last 72 hours. Lipid Profile No results for input(s): CHOL, HDL, LDLCALC, TRIG, CHOLHDL, LDLDIRECT in the last 72 hours. Thyroid function studies No results for input(s): TSH, T4TOTAL, T3FREE, THYROIDAB in the last 72 hours.  Invalid input(s): [name]3 Anemia work up No results for input(s): VITAMINB12, FOLATE, FERRITIN, TIBC, IRON, RETICCTPCT in the last 72 hours. Urinalysis    Component Value Date/Time   COLORURINE YELLOW (A) 07/16/2019 1240   APPEARANCEUR HAZY (A) 07/16/2019 1240   LABSPEC 1.017 07/16/2019 1240   PHURINE 6.0 07/16/2019 1240   GLUCOSEU NEGATIVE 07/16/2019 1240   GLUCOSEU NEGATIVE 09/04/2018 1346   HGBUR NEGATIVE 07/16/2019 1240   BILIRUBINUR NEGATIVE 07/16/2019 1240   BILIRUBINUR negative 01/08/2016 1504   KETONESUR NEGATIVE 07/16/2019 1240   PROTEINUR NEGATIVE 07/16/2019 1240   UROBILINOGEN 0.2 09/04/2018 1346   NITRITE NEGATIVE 07/16/2019 1240   LEUKOCYTESUR NEGATIVE 07/16/2019 1240   Sepsis Labs Invalid input(s): PROCALCITONIN,  WBC,  LACTICIDVEN Microbiology No results found for this or any previous visit (from the past 240 hour(s)).   Time coordinating discharge: Over 30 minutes  SIGNED:   Ezekiel Slocumb, DO [facility] 09/17/2019, 9:01 PM   If 7PM-7AM, please contact night-coverage [email or web address]

## 2019-09-05 NOTE — TOC Progression Note (Signed)
Transition of Care [facility]) - Progression Note    Patient Details  Name: [name] MRN: [id number] Date of Birth: 08-16-1927  Transition of [facility]) CM/SW Contact  Eileen [place], LCSW Phone Number: 09/05/2019, 10:31 AM  Heart Failure Screening   Pt states she has a scale at home. Pt is aware to weigh herself daily. Pt is aware that if there is weight gain to call her MD. Pt goes to Cardiologist Dr Sondra Come in [place]. Pt is also aware that a follow up apt has been made with [facility]. Pt has [place] set up through [place].   Expected Discharge Plan: [place] Barriers to Discharge: Continued Medical Work up  Expected Discharge Plan and Services Expected Discharge Plan: [place] Choice: [place] arrangements for the past 2 months: Single Family Home Expected Discharge Date: 09/05/19                         HH Arranged: RN, PT, OT, Nurse's Aide, Social Work [employer] Agency: [place] ([place]) Date [place]: 09/04/19 Time [place]: 0930 Representative spoke with at [place]: [place] Interventions    Readmission Risk Interventions Readmission Risk Prevention Plan 09/04/2019  Transportation Screening Complete  PCP or Specialist Appt within 3-5 Days Complete  HRI or [place] Complete  Palliative Care Screening Not Applicable  Medication Review (RN Care Manager) Complete  Some recent data might be hidden

## 2019-09-05 NOTE — Progress Notes (Signed)
Patient educated on discharge instructions and patient verbalized understanding. Patient will be drive home by Butch Penny, a family friend. Patient will receive home health PT upon discharge.

## 2019-09-06 NOTE — Telephone Encounter (Signed)
2nd/final attempt- Called to complete TCM. Received no answer and voicemail box is not setup. Hospital follow up scheduled 09/10/2019 @ 3 pm.

## 2019-09-07 DIAGNOSIS — F329 Major depressive disorder, single episode, unspecified: Secondary | ICD-10-CM | POA: Diagnosis not present

## 2019-09-07 DIAGNOSIS — I252 Old myocardial infarction: Secondary | ICD-10-CM | POA: Diagnosis not present

## 2019-09-07 DIAGNOSIS — I480 Paroxysmal atrial fibrillation: Secondary | ICD-10-CM | POA: Diagnosis not present

## 2019-09-07 DIAGNOSIS — Z950 Presence of cardiac pacemaker: Secondary | ICD-10-CM | POA: Diagnosis not present

## 2019-09-07 DIAGNOSIS — K589 Irritable bowel syndrome without diarrhea: Secondary | ICD-10-CM | POA: Diagnosis not present

## 2019-09-07 DIAGNOSIS — Z85828 Personal history of other malignant neoplasm of skin: Secondary | ICD-10-CM | POA: Diagnosis not present

## 2019-09-07 DIAGNOSIS — E1165 Type 2 diabetes mellitus with hyperglycemia: Secondary | ICD-10-CM | POA: Diagnosis not present

## 2019-09-07 DIAGNOSIS — D509 Iron deficiency anemia, unspecified: Secondary | ICD-10-CM | POA: Diagnosis not present

## 2019-09-07 DIAGNOSIS — I872 Venous insufficiency (chronic) (peripheral): Secondary | ICD-10-CM | POA: Diagnosis not present

## 2019-09-07 DIAGNOSIS — M5136 Other intervertebral disc degeneration, lumbar region: Secondary | ICD-10-CM | POA: Diagnosis not present

## 2019-09-07 DIAGNOSIS — Z86718 Personal history of other venous thrombosis and embolism: Secondary | ICD-10-CM | POA: Diagnosis not present

## 2019-09-07 DIAGNOSIS — Z87891 Personal history of nicotine dependence: Secondary | ICD-10-CM | POA: Diagnosis not present

## 2019-09-07 DIAGNOSIS — M810 Age-related osteoporosis without current pathological fracture: Secondary | ICD-10-CM | POA: Diagnosis not present

## 2019-09-07 DIAGNOSIS — Z90710 Acquired absence of both cervix and uterus: Secondary | ICD-10-CM | POA: Diagnosis not present

## 2019-09-07 DIAGNOSIS — E785 Hyperlipidemia, unspecified: Secondary | ICD-10-CM | POA: Diagnosis not present

## 2019-09-07 DIAGNOSIS — I2510 Atherosclerotic heart disease of native coronary artery without angina pectoris: Secondary | ICD-10-CM | POA: Diagnosis not present

## 2019-09-07 DIAGNOSIS — R296 Repeated falls: Secondary | ICD-10-CM | POA: Diagnosis not present

## 2019-09-07 DIAGNOSIS — K279 Peptic ulcer, site unspecified, unspecified as acute or chronic, without hemorrhage or perforation: Secondary | ICD-10-CM | POA: Diagnosis not present

## 2019-09-07 DIAGNOSIS — Z9049 Acquired absence of other specified parts of digestive tract: Secondary | ICD-10-CM | POA: Diagnosis not present

## 2019-09-07 DIAGNOSIS — I110 Hypertensive heart disease with heart failure: Secondary | ICD-10-CM | POA: Diagnosis not present

## 2019-09-07 DIAGNOSIS — E1151 Type 2 diabetes mellitus with diabetic peripheral angiopathy without gangrene: Secondary | ICD-10-CM | POA: Diagnosis not present

## 2019-09-07 DIAGNOSIS — I5032 Chronic diastolic (congestive) heart failure: Secondary | ICD-10-CM | POA: Diagnosis not present

## 2019-09-07 DIAGNOSIS — K5790 Diverticulosis of intestine, part unspecified, without perforation or abscess without bleeding: Secondary | ICD-10-CM | POA: Diagnosis not present

## 2019-09-07 DIAGNOSIS — F411 Generalized anxiety disorder: Secondary | ICD-10-CM | POA: Diagnosis not present

## 2019-09-07 DIAGNOSIS — I4892 Unspecified atrial flutter: Secondary | ICD-10-CM | POA: Diagnosis not present

## 2019-09-09 ENCOUNTER — Telehealth: Payer: Self-pay | Admitting: Family Medicine

## 2019-09-09 ENCOUNTER — Other Ambulatory Visit: Payer: Self-pay

## 2019-09-09 DIAGNOSIS — I5032 Chronic diastolic (congestive) heart failure: Secondary | ICD-10-CM | POA: Diagnosis not present

## 2019-09-09 DIAGNOSIS — I252 Old myocardial infarction: Secondary | ICD-10-CM | POA: Diagnosis not present

## 2019-09-09 DIAGNOSIS — I480 Paroxysmal atrial fibrillation: Secondary | ICD-10-CM | POA: Diagnosis not present

## 2019-09-09 DIAGNOSIS — I2510 Atherosclerotic heart disease of native coronary artery without angina pectoris: Secondary | ICD-10-CM | POA: Diagnosis not present

## 2019-09-09 DIAGNOSIS — I4892 Unspecified atrial flutter: Secondary | ICD-10-CM | POA: Diagnosis not present

## 2019-09-09 DIAGNOSIS — I110 Hypertensive heart disease with heart failure: Secondary | ICD-10-CM | POA: Diagnosis not present

## 2019-09-09 NOTE — Telephone Encounter (Signed)
Patient contacted the office stating she needs needs refill on Hydrocodone. Last refilled on 07/12/19 for #30 with 0 refills. Pt has upcoming appt tomorrow at 3:00. Ok to refill?

## 2019-09-09 NOTE — Telephone Encounter (Signed)
Agree with this. Thank you.  

## 2019-09-09 NOTE — Telephone Encounter (Signed)
Agree with this request

## 2019-09-09 NOTE — Telephone Encounter (Signed)
Cecille Rubin, [place], called.  Cecille Rubin needs nursing orders 1 x a week 1 week, 2 x a week 8 weeks ,2 prn , social worker orders 1x a  week 1 week,, home health aide orders 2 x a week 3 weeks.

## 2019-09-09 NOTE — Telephone Encounter (Signed)
[name] PT, with [facility] called requesting verbal orders for Physical Therapy   2 times a week for 5 weeks 1 time a week for 3 weeks  C/B # [phone] Would like detailed message left if she is not able to answer

## 2019-09-09 NOTE — Telephone Encounter (Signed)
Spoke with [name] informing her Dr. Darnell Level is giving verbal orders for PT requested.

## 2019-09-10 ENCOUNTER — Ambulatory Visit: Payer: Medicare Other | Admitting: Cardiovascular Disease

## 2019-09-10 ENCOUNTER — Ambulatory Visit (INDEPENDENT_AMBULATORY_CARE_PROVIDER_SITE_OTHER): Payer: Medicare Other | Admitting: Family Medicine

## 2019-09-10 ENCOUNTER — Encounter: Payer: Self-pay | Admitting: Family Medicine

## 2019-09-10 ENCOUNTER — Other Ambulatory Visit: Payer: Self-pay

## 2019-09-10 ENCOUNTER — Telehealth: Payer: Self-pay

## 2019-09-10 ENCOUNTER — Telehealth: Payer: Self-pay | Admitting: Family

## 2019-09-10 VITALS — BP 122/72 | HR 99 | Temp 97.6°F | Ht <= 58 in | Wt 141.2 lb

## 2019-09-10 DIAGNOSIS — G8929 Other chronic pain: Secondary | ICD-10-CM

## 2019-09-10 DIAGNOSIS — I5033 Acute on chronic diastolic (congestive) heart failure: Secondary | ICD-10-CM

## 2019-09-10 DIAGNOSIS — I872 Venous insufficiency (chronic) (peripheral): Secondary | ICD-10-CM | POA: Diagnosis not present

## 2019-09-10 DIAGNOSIS — E1122 Type 2 diabetes mellitus with diabetic chronic kidney disease: Secondary | ICD-10-CM | POA: Diagnosis not present

## 2019-09-10 DIAGNOSIS — H9193 Unspecified hearing loss, bilateral: Secondary | ICD-10-CM | POA: Diagnosis not present

## 2019-09-10 DIAGNOSIS — R011 Cardiac murmur, unspecified: Secondary | ICD-10-CM | POA: Diagnosis not present

## 2019-09-10 DIAGNOSIS — Z79899 Other long term (current) drug therapy: Secondary | ICD-10-CM | POA: Diagnosis not present

## 2019-09-10 DIAGNOSIS — M5442 Lumbago with sciatica, left side: Secondary | ICD-10-CM

## 2019-09-10 DIAGNOSIS — E1151 Type 2 diabetes mellitus with diabetic peripheral angiopathy without gangrene: Secondary | ICD-10-CM | POA: Diagnosis not present

## 2019-09-10 DIAGNOSIS — M79604 Pain in right leg: Secondary | ICD-10-CM

## 2019-09-10 DIAGNOSIS — E1149 Type 2 diabetes mellitus with other diabetic neurological complication: Secondary | ICD-10-CM

## 2019-09-10 DIAGNOSIS — N184 Chronic kidney disease, stage 4 (severe): Secondary | ICD-10-CM | POA: Diagnosis not present

## 2019-09-10 DIAGNOSIS — R2681 Unsteadiness on feet: Secondary | ICD-10-CM

## 2019-09-10 DIAGNOSIS — M79605 Pain in left leg: Secondary | ICD-10-CM

## 2019-09-10 DIAGNOSIS — M5441 Lumbago with sciatica, right side: Secondary | ICD-10-CM | POA: Diagnosis not present

## 2019-09-10 DIAGNOSIS — Z8673 Personal history of transient ischemic attack (TIA), and cerebral infarction without residual deficits: Secondary | ICD-10-CM

## 2019-09-10 DIAGNOSIS — I480 Paroxysmal atrial fibrillation: Secondary | ICD-10-CM

## 2019-09-10 DIAGNOSIS — I25119 Atherosclerotic heart disease of native coronary artery with unspecified angina pectoris: Secondary | ICD-10-CM

## 2019-09-10 MED ORDER — PIOGLITAZONE HCL 15 MG PO TABS: 15.0000 mg | ORAL_TABLET | Freq: Every day | ORAL | 6 refills | Status: DC

## 2019-09-10 NOTE — Progress Notes (Signed)
[name], female    DOB: [birth year], 86 y.o.   MRN: [id number]  HPI  [name] is a 86 y/o female with a history of CAD, DM, hyperlipidemia, HTN, anxiety, PUD, anemia, depression, IBS, DVT, cellulitis, previous tobacco use and chronic heart failure.   Echo report from 09/02/19 reviewed and showed an EF of 50-55% along with moderately elevated PA pressure and moderate MR without structural changes.   Admitted 09/02/19 due to shortness of breath and chest pain. Cardiology consult obtained. Initially given IV lasix and then transitioned to oral diuretics. Doppler ultrasound negative for DVT's. Discharged after 3 days.   She presents today for her initial visit with a chief complaint of minimal shortness of breath upon moderate exertion. She describes this as chronic in nature having been present for several years. She has associated fatigue, pedal edema, abdominal pain, headaches and light-headedness along with this. She denies any difficulty sleeping, abdominal distention, palpitations, chest pain, cough or weight gain.   Says that she weighs herself "when she thinks about it". Patient is extremely HOH making it difficult to get information across to patient. Patient does say that she didn't go by the medication list that her PCP printed out for her when taking her medications today and, instead, went by the directions on the bottles.   (Her friend [name] is present during the visit.)  Past Medical History:  Diagnosis Date  . Acute deep vein thrombosis (DVT) of distal vein of right lower extremity ([place]) 12/2013   Acute thrombus of R proximal gastrocnemius vein. Symptomatic provoked distal DVT  . Anxiety   . Basal cell carcinoma 10/30/2018   R nasal bridge, treated with Moh's  . Cellulitis of leg, left 01/16/2014  . CHF (congestive heart failure) ([place])   . Chronic back pain 03/2012   lumbar DDD with herniation - s/p L S1 transforaminal ESI (10/2012 Dr. Sharlet Salina)  . Chronic  venous insufficiency   . Coronary artery disease    CABG 1998  . Cystocele   . Depression   . Diabetes mellitus    type 2, followed by endo.  . Diastolic dysfunction   . Diverticulosis   . Hemorrhoids   . History of heart attack   . Hx of echocardiogram    a. Echo 10/13: mild LVH, EF 55-60%, Gr 1 diast dysfn, PASP 32  . Hyperlipidemia   . Hypertension   . Irritable bowel syndrome   . Microcytic anemia   . PAD (peripheral artery disease) ([place]) 2013   ABI: R 0.91, L 0.83  . Postmenopausal osteoporosis 01/2019   T score of -2.6  . PUD (peptic ulcer disease)   . Varicose veins   . Venous ulcer of leg ([place]) 12/12/2013   Required prolonged treatment by wound center  . Viral gastroenteritis due to [place] virus 05/13/2015   hospitalization   Past Surgical History:  Procedure Laterality Date  . ABDOMINAL HYSTERECTOMY  1975   TAH,BSO  . ABI  2013   R 0.91, L 0.83  . APPENDECTOMY     per pt report  . CARDIOVASCULAR STRESS TEST  2015   low risk myoview (Nahser)  . CATARACT SURGERY    . CHOLECYSTECTOMY    . COLONOSCOPY  02/2010   extensive diverticulosis throughout, small internal hemorrhoids, rec rpt 5 yrs (Dr. Allyn Kenner)  . CORONARY ARTERY BYPASS GRAFT  1998  . dexa scan  09/2011   femur -0.8, forearm 0.6 => normal  . ENDOVENOUS ABLATION SAPHENOUS  VEIN W/ LASER Left 03/2014   Kellie Simmering  . ESI  2014   L S1 transforaminal ESI x2 (Ch[place]nis) without improvement  . HAND SURGERY    . lexiscan myoview  03/2011   Small basal inferolateral and anterolateral reversible perfusion defect suggests ischemia.  EF was normal.   old infarct, no new ischemia  . LOWER EXTREMITY ANGIOGRAPHY Right 09/06/2018   Procedure: LOWER EXTREMITY ANGIOGRAPHY;  Surgeon: Algernon Huxley, MD;  Location: [facility];  Service: Cardiovascular;  Laterality: Right;  . toe surgery    . TYMPANOPLASTY  1960s   R side  . Vault prolapse A & P repair  2009   Dr Rhodia Albright [facility]   Family History  Problem  Relation Age of Onset  . Hypertension Mother   . Stroke Mother   . Diabetes Father   . Diabetes Sister   . Diabetes Brother   . Heart disease Son   . Colon cancer Neg Hx    Social History   Tobacco Use  . Smoking status: Former Smoker    Quit date: 03/05/1979    Years since quitting: 40.5  . Smokeless tobacco: Never Used  Substance Use Topics  . Alcohol use: No    Alcohol/week: 0.0 standard drinks   Allergies  Allergen Reactions  . Metformin And Related Diarrhea   Prior to Admission medications   Medication Sig Start Date End Date Taking? Authorizing Provider  acetaminophen (TYLENOL) 500 MG tablet Take 500 mg by mouth every 6 (six) hours as needed for mild pain.   Yes [provider]  ALPRAZolam Duanne Moron) 1 MG tablet Take 0.5 tablets (0.5 mg total) by mouth 4 (four) times daily as needed for anxiety. 07/26/19  Yes Ria Bush, MD  apixaban (ELIQUIS) 5 MG TABS tablet Take 1 tablet (5 mg total) by mouth 2 (two) times daily. 12/13/18  Yes Nahser, Wonda Cheng, MD  B Complex-C (B COMPLEX-VITAMIN C PO) Take 1 tablet by mouth daily. +zinc    Yes [provider]  diclofenac sodium (VOLTAREN) 1 % GEL Apply 1 application topically 3 (three) times daily. 01/09/17  Yes Ria Bush, MD  diltiazem (DILACOR XR) 120 MG 24 hr capsule Take 1 capsule (120 mg total) by mouth daily. 06/04/19  Yes Ria Bush, MD  famotidine (PEPCID) 20 MG tablet Take 1 tablet (20 mg total) by mouth daily as needed for heartburn (breakthrough heartburn). 07/30/19  Yes Ria Bush, MD  furosemide (LASIX) 40 MG tablet Take 1 tablet (40 mg total) by mouth 2 (two) times daily. 09/05/19  Yes Nicole Kindred A, DO  glucosamine-chondroitin 500-400 MG tablet Take 1 tablet by mouth daily.    Yes [provider]  glucose blood (ONETOUCH VERIO) test strip USE TO TEST BLOOD SUGAR ONCE DAILY. DX:E11.9 06/11/19  Yes Elayne Snare, MD  HYDROcodone-acetaminophen (NORCO/VICODIN) 5-325 MG tablet Take 0.5  tablets by mouth every 6 (six) hours as needed for moderate pain. 07/12/19  Yes Ria Bush, MD  loperamide (IMODIUM) 2 MG capsule Take 2 mg by mouth 4 (four) times daily as needed for diarrhea or loose stools.   Yes [provider]  loratadine (CLARITIN) 10 MG tablet Take 10 mg by mouth daily as needed for allergies.   Yes [provider]  metoprolol tartrate (LOPRESSOR) 50 MG tablet Take 50 mg by mouth 2 (two) times daily.  06/25/19  Yes Ria Bush, MD  Multiple Vitamins-Minerals (ICAPS) CAPS Take 1 capsule by mouth daily.   Yes [provider]  nitroGLYCERIN (NITROSTAT) 0.4 MG SL tablet Place 1 tablet (0.4 mg total) under the tongue every 5 (five) minutes as needed for chest pain. 02/02/17  Yes Nahser, Wonda Cheng, MD  nystatin cream (MYCOSTATIN) Apply 1 application topically 2 (two) times daily. 09/04/18  Yes Ria Bush, MD  nystatin-triamcinolone ointment [facility]) Apply to affected area twice a day as needed for itching 07/26/19  Yes [provider]  OXcarbazepine (TRILEPTAL) 150 MG tablet Take 150 mg by mouth daily. Every evening   Yes [provider]  pantoprazole (PROTONIX) 40 MG tablet TAKE 1 TABLET BY MOUTH TWICE A DAY Patient taking differently: Take 40 mg by mouth daily.  06/27/19  Yes Ria Bush, MD  pioglitazone (ACTOS) 15 MG tablet Take 1 tablet (15 mg total) by mouth daily. 09/10/19  Yes Ria Bush, MD  polyethylene glycol powder (GLYCOLAX/MIRALAX) powder Take 17 g by mouth daily. Hold for diarrhea 06/18/18  Yes Ria Bush, MD  potassium chloride (MICRO-K) 10 MEQ CR capsule Take 1 capsule (10 mEq total) by mouth daily for 3 days. 07/30/19 09/11/19 Yes Ria Bush, MD  pravastatin (PRAVACHOL) 20 MG tablet TAKE 1 TABLET BY MOUTH EVERY DAY Patient taking differently: Take 20 mg by mouth every evening.  08/19/19  Yes Ria Bush, MD  Probiotic Product (ALIGN) 4 MG CAPS Take 1 capsule (4 mg total) by mouth  daily. 08/15/17  Yes Ria Bush, MD  sitaGLIPtin (JANUVIA) 100 MG tablet Take 1/2 tablet by mouth once daily. Patient taking differently: Take 50 mg by mouth daily. Take 1/2 tablet by mouth once daily. 07/15/19  Yes Elayne Snare, MD  traZODone (DESYREL) 150 MG tablet Take 0.5 tablets (75 mg total) by mouth at bedtime. 07/30/19  Yes Ria Bush, MD  vitamin E 1000 UNIT capsule Take 1,000 Units by mouth daily.   Yes [provider]      Review of Systems  Constitutional: Positive for fatigue. Negative for appetite change (decreased).  HENT: Positive for hearing loss. Negative for congestion.   Eyes: Negative.   Respiratory: Positive for shortness of breath (with moderate exertion). Negative for cough.   Cardiovascular: Positive for leg swelling. Negative for chest pain and palpitations.  Gastrointestinal: Positive for abdominal pain and constipation. Negative for abdominal distention.  Endocrine: Negative.   Genitourinary: Negative.   Musculoskeletal: Negative for back pain and neck pain.  Skin: Negative.   Allergic/Immunologic: Negative.   Neurological: Positive for light-headedness (at times) and headaches. Negative for dizziness.  Hematological: Negative for adenopathy. Does not bruise/bleed easily.  Psychiatric/Behavioral: Negative for dysphoric mood and sleep disturbance. The patient is not nervous/anxious.     Vitals:   09/11/19 1246  BP: 108/70  Pulse: 65  Resp: 16  SpO2: 96%  Weight: 140 lb (63.5 kg)  Height: 4\' 9"  (1.448 m)   Wt Readings from Last 3 Encounters:  09/11/19 140 lb (63.5 kg)  09/10/19 141 lb 3 oz (64 kg)  09/05/19 133 lb 3.2 oz (60.4 kg)   Lab Results  Component Value Date   CREATININE 0.96 09/10/2019   CREATININE 1.01 (H) 09/05/2019   CREATININE 0.92 09/04/2019    Physical Exam Vitals and nursing note reviewed. Exam conducted with a chaperone present.  Constitutional:      Appearance: Normal appearance.  HENT:     Head:  Normocephalic and atraumatic.     Right Ear: Decreased hearing noted.     Left Ear: Decreased hearing noted.  Cardiovascular:     Rate and Rhythm: Normal  rate and regular rhythm.  Pulmonary:     Effort: Pulmonary effort is normal. No respiratory distress.     Breath sounds: No wheezing or rales.  Abdominal:     General: There is no distension.     Palpations: Abdomen is soft.     Tenderness: There is no abdominal tenderness.  Musculoskeletal:        General: No tenderness.     Cervical back: Normal range of motion and neck supple.     Right lower leg: Edema (1+ pitting) present.     Left lower leg: Edema (1+ pitting) present.  Skin:    General: Skin is warm and dry.  Neurological:     General: No focal deficit present.     Mental Status: She is alert and oriented to person, place, and time.  Psychiatric:        Mood and Affect: Mood normal.        Behavior: Behavior normal.        Thought Content: Thought content normal.    Assessment & Plan:  1: Chronic heart failure with preserved ejection fraction- - NYHA class II - euvolemic today  - has scales; instructed to weigh daily and call for an overnight weight gain of >2 pounds or a weekly weight gain of >5 pounds - not adding salt but is unsure of sodium content - encouraged to elevate legs when sitting for long periods of time; should edema continue, could consider changing her diuretic to torsemide if needed - saw cardiology [name]) 05/27/19 & returns 10/04/19 - has home health and PT - BNP 09/02/19 was 409.8  2: HTN- - BP looks good today - saw PCP [name]) 09/10/19 - BMP 09/05/19 reviewed and showed sodium 139, potassium 4.5, creatinine 1.01 and GFR 49  3: DM-  - A1c 09/02/19 was 7.4% - nonfasting glucose in clinic today was 124   Medication list was reviewed. Based on what her friend [name] says, patient's PCP says that patient is supposed to be going by the medication list but patient says that she's going by what her  bottles say.  Patient opts to not make a return appointment at this time as she says that she already sees too many providers.

## 2019-09-10 NOTE — Telephone Encounter (Signed)
Unable to reach patient regarding her new patient CHF Clinic appointment scheduled for 6/9 after her recent hospital discharge.    [name], [place]

## 2019-09-10 NOTE — Progress Notes (Signed)
This visit was conducted in person.  BP 122/72 (BP Location: Left Arm, Patient Position: Sitting, Cuff Size: Normal)    Pulse 99    Temp 97.6 F (36.4 C) (Temporal)    Ht 4\' 9"  (1.448 m)    Wt 141 lb 3 oz (64 kg)    SpO2 97%    BMI 30.55 kg/m    CC: hosp f/u visit  Subjective:    [name], female    DOB: 13-Oct-1927, 86 y.o.   MRN: [id number]  HPI: [name] is a 86 y.o. female presenting on 09/10/2019 for Hospitalization Follow-up (Pt accompanined by friend, [name]- temp 98.2.  Per [name], pt has been taking hydrocodone all through night.  Also, states pt keeps anxiety med in her purse and takes whenever.  Says with pt doing this, she is stumbling around more and/or sleeping days at a time. )   Recent hospitalization for acute worsening dyspnea with leg swelling, chest xray showed cardiomegaly with mild pulmonary edema and EKG showed atrial flutter s/p treatment with IV lasix 40mg  twice daily, transitioned back to oral lasix upon discharge. Diuresed -1.9L. Planned to establish with CHF clinic. Viral panel negative, SARS CoV2 negative.   D/C summary not available.   [facility], PT, SW, nurse aide have established.  Ongoing confusion over medication management in polypharmacy.  Friend is concerned that she is not taking meds appropriately.   TCM hosp f/u phone call not completed despite 2 attempts  Admission date 09/02/2019 Discharge date 09/05/2019  Discharge Diagnoses: Principal Problem:   (HFpEF) heart failure with preserved ejection fraction (HCC) Active Problems:   Coronary artery disease   GAD (generalized anxiety disorder)   Hypertension   Paroxysmal atrial fibrillation ([place])   CKD stage 4 due to type 2 diabetes mellitus (HCC)   CHF (congestive heart failure), NYHA class I, acute on chronic, diastolic (HCC)   Persistent atrial fibrillation ([place])  Echocardiogram 08/2019 IMPRESSIONS: 1. Left ventricular ejection fraction, by estimation, is 50 to 55%.  The  left ventricle has low normal function. The left ventricle has no regional  wall motion abnormalities. Left ventricular diastolic parameters are  indeterminate.  2. Right ventricular systolic function is moderately reduced. The right  ventricular size is normal. There is moderately elevated pulmonary artery  systolic pressure.  3. The mitral valve is normal in structure. Moderate mitral valve regurgitation. No evidence of mitral stenosis.  4. Tricuspid valve regurgitation is mild to moderate.  5. Aortic valve is mildly thickened; cannot exclude bicuspid morphology.  The aortic valve is abnormal. Aortic valve regurgitation is not  visualized. No aortic stenosis is present.  6. The inferior vena cava is dilated in size with <50% respiratory variability, suggesting right atrial pressure of 15 mmHg     Relevant past medical, surgical, family and social history reviewed and updated as indicated. Interim medical history since our last visit reviewed. Allergies and medications reviewed and updated. Outpatient Medications Prior to Visit  Medication Sig Dispense Refill   acetaminophen (TYLENOL) 500 MG tablet Take 500 mg by mouth every 6 (six) hours as needed for mild pain.     ALPRAZolam (XANAX) 1 MG tablet Take 0.5 tablets (0.5 mg total) by mouth 4 (four) times daily as needed for anxiety.     apixaban (ELIQUIS) 5 MG TABS tablet Take 1 tablet (5 mg total) by mouth 2 (two) times daily. 60 tablet 11   B Complex-C (B COMPLEX-VITAMIN C PO) Take 1 tablet  by mouth daily. +zinc      diclofenac sodium (VOLTAREN) 1 % GEL Apply 1 application topically 3 (three) times daily. 1 Tube 1   diltiazem (DILACOR XR) 120 MG 24 hr capsule Take 1 capsule (120 mg total) by mouth daily. 30 capsule 6   famotidine (PEPCID) 20 MG tablet Take 1 tablet (20 mg total) by mouth daily as needed for heartburn (breakthrough heartburn).     furosemide (LASIX) 40 MG tablet Take 1 tablet (40 mg total) by mouth 2 (two) times  daily. 60 tablet 0   glucosamine-chondroitin 500-400 MG tablet Take 1 tablet by mouth daily.      glucose blood (ONETOUCH VERIO) test strip USE TO TEST BLOOD SUGAR ONCE DAILY. DX:E11.9 100 each 6   HYDROcodone-acetaminophen (NORCO/VICODIN) 5-325 MG tablet Take 0.5 tablets by mouth every 6 (six) hours as needed for moderate pain. 30 tablet 0   loperamide (IMODIUM) 2 MG capsule Take 2 mg by mouth 4 (four) times daily as needed for diarrhea or loose stools.     loratadine (CLARITIN) 10 MG tablet Take 10 mg by mouth daily as needed for allergies.     metoprolol tartrate (LOPRESSOR) 50 MG tablet Take 50 mg by mouth 2 (two) times daily.      Multiple Vitamins-Minerals (ICAPS) CAPS Take 1 capsule by mouth daily.     nitroGLYCERIN (NITROSTAT) 0.4 MG SL tablet Place 1 tablet (0.4 mg total) under the tongue every 5 (five) minutes as needed for chest pain. 25 tablet 3   nystatin cream (MYCOSTATIN) Apply 1 application topically 2 (two) times daily. 45 g 0   nystatin-triamcinolone ointment (MYCOLOG) Apply to affected area twice a day as needed for itching     OXcarbazepine (TRILEPTAL) 150 MG tablet Take 150 mg by mouth daily. Every evening     pantoprazole (PROTONIX) 40 MG tablet TAKE 1 TABLET BY MOUTH TWICE A DAY (Patient taking differently: Take 40 mg by mouth daily. ) 180 tablet 2   polyethylene glycol powder (GLYCOLAX/MIRALAX) powder Take 17 g by mouth daily. Hold for diarrhea 3350 g 1   pravastatin (PRAVACHOL) 20 MG tablet TAKE 1 TABLET BY MOUTH EVERY DAY (Patient taking differently: Take 20 mg by mouth every evening. ) 90 tablet 1   Probiotic Product (ALIGN) 4 MG CAPS Take 1 capsule (4 mg total) by mouth daily.     sitaGLIPtin (JANUVIA) 100 MG tablet Take 1/2 tablet by mouth once daily. (Patient taking differently: Take 50 mg by mouth daily. Take 1/2 tablet by mouth once daily.) 30 tablet 2   traZODone (DESYREL) 150 MG tablet Take 0.5 tablets (75 mg total) by mouth at bedtime.      vitamin E 1000 UNIT capsule Take 1,000 Units by mouth daily.     pioglitazone (ACTOS) 30 MG tablet TAKE 1 TABLET BY MOUTH EVERY DAY (Patient taking differently: Take 30 mg by mouth daily. ) 30 tablet 11   potassium chloride (MICRO-K) 10 MEQ CR capsule Take 1 capsule (10 mEq total) by mouth daily for 3 days. 30 capsule 1   No facility-administered medications prior to visit.     Per HPI unless specifically indicated in ROS section below Review of Systems Objective:  BP 122/72 (BP Location: Left Arm, Patient Position: Sitting, Cuff Size: Normal)    Pulse 99    Temp 97.6 F (36.4 C) (Temporal)    Ht 4\' 9"  (1.448 m)    Wt 141 lb 3 oz (64 kg)    SpO2 97%  BMI 30.55 kg/m   Wt Readings from Last 3 Encounters:  09/10/19 141 lb 3 oz (64 kg)  09/05/19 133 lb 3.2 oz (60.4 kg)  08/23/19 139 lb (63 kg)      Physical Exam Vitals and nursing note reviewed.  Constitutional:      Appearance: Normal appearance. She is not ill-appearing.  Cardiovascular:     Rate and Rhythm: Normal rate and regular rhythm.     Pulses: Normal pulses.     Heart sounds: Murmur (2/6 systolic) present.     Comments: Coupling noted Pulmonary:     Effort: Pulmonary effort is normal. No respiratory distress.     Breath sounds: Normal breath sounds. No wheezing, rhonchi or rales.  Musculoskeletal:     Right lower leg: Edema (tr) present.     Left lower leg: Edema (tr) present.     Comments: Compression stockings in place  Skin:    General: Skin is warm and dry.     Findings: No rash.  Neurological:     Mental Status: She is alert.  Psychiatric:        Mood and Affect: Mood normal.        Behavior: Behavior normal.       Assessment & Plan:  This visit occurred during the SARS-CoV-2 public health emergency.  Safety protocols were in place, including screening questions prior to the visit, additional usage of staff PPE, and extensive cleaning of exam room while observing appropriate contact time as indicated for  disinfecting solutions.   Problem List Items Addressed This Visit    Unsteady gait    Polypharmacy contributes  Recent abnormal head CT (?[facility]) - pending neurology eval.       Type 2 diabetes mellitus with neurological manifestations, controlled ([place])    With worsening CHF trouble (2 hospitalizations over the last few months for CHF exacerbation), will recommend decreased actos - will start 15mg  daily and notify Dr Dwyane Dee endocrinology.       Relevant Medications   pioglitazone (ACTOS) 15 MG tablet   Systolic murmur    Recent echo with moderate MR, TR and abnormal aortic valve ?bicuspid       Polypharmacy    HHRN involved in medication management.  Reviewed med changes today (actos 15mg  daily, lasix 40mg  bid)      Paroxysmal atrial fibrillation (HCC)    Rate controlled on eliquis and metoprolol.       History of CVA in adulthood    Incidental finding on recent CT 08/2019.  Continue statin and eliquis, consider aspirin.       Diabetes mellitus type 2 with peripheral artery disease ([place])    See above.       Relevant Medications   pioglitazone (ACTOS) 15 MG tablet   CKD stage 4 due to type 2 diabetes mellitus ([place])    Update labs on higher lasix dose.       Relevant Medications   pioglitazone (ACTOS) 15 MG tablet   Other Relevant Orders   Renal function panel   Chronic venous insufficiency   Chronic lower back pain    Has been on hydrocodone for this - overall stable period. Will consider titration off narcotic.       Chronic leg pain   Bilateral hearing loss    Hearing aides not functioning - difficult to communicate effectively during office visit today. Will refer to audiology.       Relevant Orders   Ambulatory referral to Audiology   (  HFpEF) heart failure with preserved ejection fraction ([place]) - Primary    Recent hospitalization for CHF exacerbation. Records available reviewed.  New lasix dose is 40mg  BID - reviewed with patient and friend. Will update BMP  on new lasix dose.  Planning to establish with CHF clinic tomorrow. Encouraged she keep that appointment.       Relevant Orders   Renal function panel       Meds ordered this encounter  Medications   pioglitazone (ACTOS) 15 MG tablet    Sig: Take 1 tablet (15 mg total) by mouth daily.    Dispense:  30 tablet    Refill:  6    Note new dose   Orders Placed This Encounter  Procedures   Renal function panel   Ambulatory referral to Audiology    Referral Priority:   Routine    Referral Type:   Audiology Exam    Referral Reason:   Specialty Services Required    Number of Visits Requested:   1    Patient Instructions  Labs today  New lasix dose is 40mg  twice daily - in the morning and early afternoon.  Keep appointment with heart failure clinic tomorrow.  Decrease actos to 15mg  daily (new dose at pharmacy) - I will let Dr Dwyane Dee know about this drop.  Return to see me in 1 month for follow up visit.    Follow up plan: Return in about 4 weeks (around 10/08/2019), or if symptoms worsen or fail to improve, for follow up visit.  Ria Bush, MD

## 2019-09-10 NOTE — Telephone Encounter (Signed)
Agree with this. See other phone notes from yesterday.

## 2019-09-10 NOTE — Patient Instructions (Addendum)
Labs today  New lasix dose is 40mg  twice daily - in the morning and early afternoon.  Keep appointment with heart failure clinic tomorrow.  Decrease actos to 15mg  daily (new dose at pharmacy) - I will let Dr Dwyane Dee know about this drop.  Return to see me in 1 month for follow up visit.

## 2019-09-10 NOTE — Telephone Encounter (Signed)
I received a call from [place] with [facility]. She needs VO got [place] nursing 1x a week for 1 week, 2x a week for 8 weeks, 2 PRN visits, and a [place] aide 2x a week for 3 weeks. [place] for verbal? She can be reached back at [phone], option 2.  Ok to leave a VM

## 2019-09-10 NOTE — Telephone Encounter (Signed)
Spoke with Cecille Rubin informing her Dr. Darnell Level is giving verbal orders for services requested.

## 2019-09-11 ENCOUNTER — Ambulatory Visit: Payer: Medicare Other | Attending: Family | Admitting: Family

## 2019-09-11 ENCOUNTER — Encounter: Payer: Self-pay | Admitting: Family

## 2019-09-11 VITALS — BP 108/70 | HR 65 | Resp 16 | Ht <= 58 in | Wt 140.0 lb

## 2019-09-11 DIAGNOSIS — D649 Anemia, unspecified: Secondary | ICD-10-CM | POA: Diagnosis not present

## 2019-09-11 DIAGNOSIS — R42 Dizziness and giddiness: Secondary | ICD-10-CM | POA: Diagnosis not present

## 2019-09-11 DIAGNOSIS — I110 Hypertensive heart disease with heart failure: Secondary | ICD-10-CM | POA: Diagnosis not present

## 2019-09-11 DIAGNOSIS — Z87891 Personal history of nicotine dependence: Secondary | ICD-10-CM | POA: Diagnosis not present

## 2019-09-11 DIAGNOSIS — Z7901 Long term (current) use of anticoagulants: Secondary | ICD-10-CM | POA: Insufficient documentation

## 2019-09-11 DIAGNOSIS — R109 Unspecified abdominal pain: Secondary | ICD-10-CM | POA: Insufficient documentation

## 2019-09-11 DIAGNOSIS — F419 Anxiety disorder, unspecified: Secondary | ICD-10-CM | POA: Diagnosis not present

## 2019-09-11 DIAGNOSIS — N1831 Chronic kidney disease, stage 3a: Secondary | ICD-10-CM

## 2019-09-11 DIAGNOSIS — Z86718 Personal history of other venous thrombosis and embolism: Secondary | ICD-10-CM | POA: Diagnosis not present

## 2019-09-11 DIAGNOSIS — Z8249 Family history of ischemic heart disease and other diseases of the circulatory system: Secondary | ICD-10-CM | POA: Diagnosis not present

## 2019-09-11 DIAGNOSIS — R0602 Shortness of breath: Secondary | ICD-10-CM | POA: Diagnosis not present

## 2019-09-11 DIAGNOSIS — E785 Hyperlipidemia, unspecified: Secondary | ICD-10-CM | POA: Diagnosis not present

## 2019-09-11 DIAGNOSIS — R5383 Other fatigue: Secondary | ICD-10-CM | POA: Insufficient documentation

## 2019-09-11 DIAGNOSIS — I509 Heart failure, unspecified: Secondary | ICD-10-CM | POA: Insufficient documentation

## 2019-09-11 DIAGNOSIS — R519 Headache, unspecified: Secondary | ICD-10-CM | POA: Diagnosis not present

## 2019-09-11 DIAGNOSIS — I5032 Chronic diastolic (congestive) heart failure: Secondary | ICD-10-CM

## 2019-09-11 DIAGNOSIS — F329 Major depressive disorder, single episode, unspecified: Secondary | ICD-10-CM | POA: Diagnosis not present

## 2019-09-11 DIAGNOSIS — Z79899 Other long term (current) drug therapy: Secondary | ICD-10-CM | POA: Diagnosis not present

## 2019-09-11 DIAGNOSIS — I10 Essential (primary) hypertension: Secondary | ICD-10-CM

## 2019-09-11 DIAGNOSIS — E118 Type 2 diabetes mellitus with unspecified complications: Secondary | ICD-10-CM | POA: Diagnosis not present

## 2019-09-11 LAB — RENAL FUNCTION PANEL
Albumin: 4 g/dL (ref 3.5–5.2)
BUN: 24 mg/dL — ABNORMAL HIGH (ref 6–23)
CO2: 32 mEq/L (ref 19–32)
Calcium: 9.8 mg/dL (ref 8.4–10.5)
Chloride: 98 mEq/L (ref 96–112)
Creatinine, Ser: 0.96 mg/dL (ref 0.40–1.20)
GFR: 54.57 mL/min — ABNORMAL LOW (ref >60.00)
Glucose, Bld: 97 mg/dL (ref 70–99)
Phosphorus: 3.5 mg/dL (ref 2.3–4.6)
Potassium: 4.2 mEq/L (ref 3.5–5.1)
Sodium: 137 mEq/L (ref 135–145)

## 2019-09-11 LAB — GLUCOSE, CAPILLARY: Glucose-Capillary: 124 mg/dL — ABNORMAL HIGH (ref 70–99)

## 2019-09-11 NOTE — Assessment & Plan Note (Signed)
Has been on hydrocodone for this - overall stable period. Will consider titration off narcotic.

## 2019-09-11 NOTE — Assessment & Plan Note (Signed)
See above

## 2019-09-11 NOTE — Patient Instructions (Addendum)
Resume weighing daily and call for an overnight weight gain of > 2 pounds or a weekly weight gain of >5 pounds.   Call us in the future if you'd like to schedule another appointment 

## 2019-09-11 NOTE — Assessment & Plan Note (Signed)
Rate controlled on eliquis and metoprolol.

## 2019-09-11 NOTE — Assessment & Plan Note (Signed)
Update labs on higher lasix dose.

## 2019-09-11 NOTE — Assessment & Plan Note (Addendum)
Recent hospitalization for CHF exacerbation. Records available reviewed.  New lasix dose is 40mg  BID - reviewed with patient and friend. Will update BMP on new lasix dose.  Planning to establish with CHF clinic tomorrow. Encouraged she keep that appointment.

## 2019-09-11 NOTE — Assessment & Plan Note (Signed)
Recent echo with moderate MR, TR and abnormal aortic valve ?bicuspid

## 2019-09-11 NOTE — Assessment & Plan Note (Signed)
With worsening CHF trouble (2 hospitalizations over the last few months for CHF exacerbation), will recommend decreased actos - will start 15mg  daily and notify Dr Dwyane Dee endocrinology.

## 2019-09-11 NOTE — Telephone Encounter (Signed)
Spoke with [facility] (a Librarian, academic, name?), informing her Dr. Darnell Level is giving verbal orders for services requested by [employer].  Says she will document it and notify Jeani Hawking.

## 2019-09-11 NOTE — Assessment & Plan Note (Signed)
HHRN involved in medication management.  Reviewed med changes today (actos 15mg  daily, lasix 40mg  bid)

## 2019-09-11 NOTE — Assessment & Plan Note (Signed)
Hearing aides not functioning - difficult to communicate effectively during office visit today. Will refer to audiology.

## 2019-09-11 NOTE — Assessment & Plan Note (Addendum)
Polypharmacy contributes  Recent abnormal head CT (?[facility]) - pending neurology eval.

## 2019-09-11 NOTE — Assessment & Plan Note (Signed)
Incidental finding on recent CT 08/2019.  Continue statin and eliquis, consider aspirin.

## 2019-09-12 ENCOUNTER — Other Ambulatory Visit: Payer: Self-pay

## 2019-09-12 ENCOUNTER — Ambulatory Visit (INDEPENDENT_AMBULATORY_CARE_PROVIDER_SITE_OTHER): Payer: Medicare Other | Admitting: Podiatry

## 2019-09-12 DIAGNOSIS — M79675 Pain in left toe(s): Secondary | ICD-10-CM

## 2019-09-12 DIAGNOSIS — Q828 Other specified congenital malformations of skin: Secondary | ICD-10-CM

## 2019-09-12 DIAGNOSIS — E1149 Type 2 diabetes mellitus with other diabetic neurological complication: Secondary | ICD-10-CM | POA: Diagnosis not present

## 2019-09-12 DIAGNOSIS — B351 Tinea unguium: Secondary | ICD-10-CM

## 2019-09-12 DIAGNOSIS — M79674 Pain in right toe(s): Secondary | ICD-10-CM | POA: Diagnosis not present

## 2019-09-12 MED ORDER — HYDROCODONE-ACETAMINOPHEN 5-325 MG PO TABS: 0.5000 | ORAL_TABLET | Freq: Two times a day (BID) | ORAL | 0 refills | Status: DC | PRN

## 2019-09-12 NOTE — Telephone Encounter (Signed)
ERx. Please notify I'd like her to continue decreasing hydrocodone use - to 1/2 tab twice daily but only as needed when having pain. If no pain one day, don't take medicine.

## 2019-09-13 DIAGNOSIS — I4892 Unspecified atrial flutter: Secondary | ICD-10-CM | POA: Diagnosis not present

## 2019-09-13 DIAGNOSIS — I480 Paroxysmal atrial fibrillation: Secondary | ICD-10-CM | POA: Diagnosis not present

## 2019-09-13 DIAGNOSIS — I110 Hypertensive heart disease with heart failure: Secondary | ICD-10-CM | POA: Diagnosis not present

## 2019-09-13 DIAGNOSIS — I2510 Atherosclerotic heart disease of native coronary artery without angina pectoris: Secondary | ICD-10-CM | POA: Diagnosis not present

## 2019-09-13 DIAGNOSIS — I252 Old myocardial infarction: Secondary | ICD-10-CM | POA: Diagnosis not present

## 2019-09-13 DIAGNOSIS — I5032 Chronic diastolic (congestive) heart failure: Secondary | ICD-10-CM | POA: Diagnosis not present

## 2019-09-16 ENCOUNTER — Telehealth: Payer: Self-pay

## 2019-09-16 NOTE — Progress Notes (Signed)
Subjective: 86 y.o. returns the office today for painful, elongated, thickened toenails which she cannot trim herself as well as for painful callus on the ball of her right foot.  She is continue with Vaseline to the callus which is been helping.  Currently denies any open sores to her lower extremities.  Objective: AAO 3, NAD DP/PT pulses palpable 1/4, CRT less than 3 seconds Protective sensation decreased with Simms Weinstein monofilament Nails hypertrophic, dystrophic, elongated, brittle, discolored 10. There is tenderness overlying the nails 1-5 bilaterally. There is no surrounding erythema or drainage along the nail sites. Hyperkeratic lesion to the right foot submetatarsal 2. Upon debridement, no underlying ulceration, drainage, or signs of infection.  Prominent metatarsal head plantar. Hammertoes present.  Varicose veins are present.   No open lesions or pre-ulcerative lesions are identified.  No pain with calf compression, swelling, warmth, erythema.  Assessment: Patient presents with symptomatic onychomycosis, hyperkeratotic lesion  Plan: -Treatment options including alternatives, risks, complications were discussed -Hyperkeratotic lesion sharply debrided x 1 without complications or bleeding. -Nails sharply debrided 10 without complication/bleeding. -Discussed daily foot inspection. If there are any changes, to call the office immediately.  -Follow-up in 3 months or sooner if any problems are to arise. In the meantime, encouraged to call the office with any questions, concerns, changes symptoms.  Celesta Gentile, DPM

## 2019-09-16 NOTE — Telephone Encounter (Signed)
Patient aware of results.  She says she is not urinating like she should.  She says she is adjusting her meds as she sees necessary.  Attempted to clarify meds.

## 2019-09-16 NOTE — Telephone Encounter (Signed)
-----   Message from Ria Bush, MD sent at 09/14/2019 10:55 PM EDT ----- Results released via [place] notify - kidneys were stable. Continue lasix 40mg  twice daily.

## 2019-09-17 ENCOUNTER — Ambulatory Visit (INDEPENDENT_AMBULATORY_CARE_PROVIDER_SITE_OTHER): Payer: Medicare Other | Admitting: Endocrinology

## 2019-09-17 ENCOUNTER — Encounter: Payer: Self-pay | Admitting: Endocrinology

## 2019-09-17 VITALS — BP 110/70 | HR 96 | Ht <= 58 in | Wt 142.0 lb

## 2019-09-17 DIAGNOSIS — I25119 Atherosclerotic heart disease of native coronary artery with unspecified angina pectoris: Secondary | ICD-10-CM

## 2019-09-17 DIAGNOSIS — E1165 Type 2 diabetes mellitus with hyperglycemia: Secondary | ICD-10-CM

## 2019-09-17 MED ORDER — SITAGLIPTIN PHOSPHATE 100 MG PO TABS: ORAL_TABLET | ORAL | 2 refills | Status: DC

## 2019-09-17 NOTE — Progress Notes (Signed)
[name], female   DOB: [birth year], 86 y.o.   MRN: [id number]   Reason for Appointment: Diabetes follow-up   History of Present Illness   Diagnosis: Type 2 DIABETES MELITUS, date of diagnosis: 1991   She has had mild diabetes for several years and has been on Actos only since about 2001 Her A1c has generally been around or under 7% She was given a trial of metformin because of her difficulty with weight loss and also edema but she felt that it made her blood sugars go up and she will not try it again Also she thinks her blood sugars were higher with a trial of Januvia  RECENT history:  A1c is today 7.4 compared to 7.5 previously  Oral hypoglycemic drugs: Actos 15 mg daily, Januvia 100 mg  Current blood sugar patterns, day-to-day management and problems identified:  Difficult to communicate with the patient because of her hearing difficulty   She again did not bring her meter for download  She thinks that she had a reading of 230 about 3 weeks ago transiently but not clear what she had to eat before this  Because of her increasing A1c she was started on Januvia on the last visit  Not clear if she is taking this half tablet once a day or twice a day but is not complaining about the cost currently  Her companion today states that she is usually eating ice cream at night daily  More recently she was admitted to the hospital with edema and appears to have had findings of right heart failure with normal LV function Actos was reduced to 15 mg last week by PCP         Side effects from medications: None  Monitors blood glucose: ?  Frequency .    Glucometer: One Touch.           Blood Glucose readings at home range from recall: 116-145          Meals: 2  meals per day usually  Physical activity:  minimal            Dietician visit: Most recent: Several years ago             Wt Readings from Last 3 Encounters:  09/17/19 142 lb (64.4 kg)    09/11/19 140 lb (63.5 kg)  09/10/19 141 lb 3 oz (64 kg)   Lab Results  Component Value Date   HGBA1C 7.4 (H) 09/02/2019   HGBA1C 7.5 (A) 06/11/2019   HGBA1C 6.9 (H) 02/08/2019   Lab Results  Component Value Date   MICROALBUR <0.7 09/04/2018   LDLCALC 56 02/04/2019   CREATININE 0.96 09/10/2019     Allergies as of 09/17/2019      Reactions   Metformin And Related Diarrhea      Medication List       Accurate as of September 17, 2019  2:16 PM. If you have any questions, ask your nurse or doctor.        STOP taking these medications   nystatin cream Commonly known as: MYCOSTATIN Stopped by: Elayne Snare, MD     TAKE these medications   acetaminophen 500 MG tablet Commonly known as: TYLENOL Take 500 mg by mouth every 6 (six) hours as needed for mild pain.   Align 4 MG Caps Take 1 capsule (4 mg total) by mouth daily.   ALPRAZolam 1 MG tablet Commonly known as: XANAX Take 0.5 tablets (0.5  mg total) by mouth 4 (four) times daily as needed for anxiety.   apixaban 5 MG Tabs tablet Commonly known as: Eliquis Take 1 tablet (5 mg total) by mouth 2 (two) times daily.   B COMPLEX-VITAMIN C PO Take 1 tablet by mouth daily. +zinc   diclofenac sodium 1 % Gel Commonly known as: VOLTAREN Apply 1 application topically 3 (three) times daily.   diltiazem 120 MG 24 hr capsule Commonly known as: DILACOR XR Take 1 capsule (120 mg total) by mouth daily.   famotidine 20 MG tablet Commonly known as: Pepcid Take 1 tablet (20 mg total) by mouth daily as needed for heartburn (breakthrough heartburn).   furosemide 40 MG tablet Commonly known as: LASIX Take 1 tablet (40 mg total) by mouth 2 (two) times daily.   glucosamine-chondroitin 500-400 MG tablet Take 1 tablet by mouth daily.   HYDROcodone-acetaminophen 5-325 MG tablet Commonly known as: NORCO/VICODIN Take 0.5 tablets by mouth 2 (two) times daily as needed for moderate pain or severe pain. What changed: when to take this    ICaps Caps Take 1 capsule by mouth daily.   loperamide 2 MG capsule Commonly known as: IMODIUM Take 2 mg by mouth 4 (four) times daily as needed for diarrhea or loose stools.   loratadine 10 MG tablet Commonly known as: CLARITIN Take 10 mg by mouth daily as needed for allergies.   metoprolol tartrate 50 MG tablet Commonly known as: LOPRESSOR Take 50 mg by mouth 2 (two) times daily.   nitroGLYCERIN 0.4 MG SL tablet Commonly known as: NITROSTAT Place 1 tablet (0.4 mg total) under the tongue every 5 (five) minutes as needed for chest pain.   nystatin-triamcinolone ointment Commonly known as: MYCOLOG Apply to affected area twice a day as needed for itching   OneTouch Verio test strip Generic drug: glucose blood USE TO TEST BLOOD SUGAR ONCE DAILY. DX:E11.9   OXcarbazepine 150 MG tablet Commonly known as: TRILEPTAL Take 150 mg by mouth daily. Every evening   pantoprazole 40 MG tablet Commonly known as: PROTONIX TAKE 1 TABLET BY MOUTH TWICE A DAY What changed: when to take this   pioglitazone 15 MG tablet Commonly known as: ACTOS Take 1 tablet (15 mg total) by mouth daily.   polyethylene glycol powder 17 GM/SCOOP powder Commonly known as: GLYCOLAX/MIRALAX Take 17 g by mouth daily. Hold for diarrhea   potassium chloride 10 MEQ CR capsule Commonly known as: MICRO-K Take 1 capsule (10 mEq total) by mouth daily for 3 days.   pravastatin 20 MG tablet Commonly known as: PRAVACHOL TAKE 1 TABLET BY MOUTH EVERY DAY What changed: when to take this   sitaGLIPtin 100 MG tablet Commonly known as: Januvia Take 1/2 tablet by mouth once daily. What changed:   how much to take  how to take this  when to take this   traZODone 150 MG tablet Commonly known as: DESYREL Take 0.5 tablets (75 mg total) by mouth at bedtime.   vitamin E 1000 UNIT capsule Take 1,000 Units by mouth daily.       Allergies:  Allergies  Allergen Reactions  . Metformin And Related Diarrhea     Past Medical History:  Diagnosis Date  . Acute deep vein thrombosis (DVT) of distal vein of right lower extremity ([place]) 12/2013   Acute thrombus of R proximal gastrocnemius vein. Symptomatic provoked distal DVT  . Anxiety   . Basal cell carcinoma 10/30/2018   R nasal bridge, treated with Moh's  . Cellulitis of  leg, left 01/16/2014  . CHF (congestive heart failure) ([place])   . Chronic back pain 03/2012   lumbar DDD with herniation - s/p L S1 transforaminal ESI (10/2012 Dr. Sharlet Salina)  . Chronic venous insufficiency   . Coronary artery disease    CABG 1998  . Cystocele   . Depression   . Diabetes mellitus    type 2, followed by endo.  . Diastolic dysfunction   . Diverticulosis   . Hemorrhoids   . History of heart attack   . Hx of echocardiogram    a. Echo 10/13: mild LVH, EF 55-60%, Gr 1 diast dysfn, PASP 32  . Hyperlipidemia   . Hypertension   . Irritable bowel syndrome   . Microcytic anemia   . PAD (peripheral artery disease) ([place]) 2013   ABI: R 0.91, L 0.83  . Postmenopausal osteoporosis 01/2019   T score of -2.6  . PUD (peptic ulcer disease)   . Varicose veins   . Venous ulcer of leg ([place]) 12/12/2013   Required prolonged treatment by wound center  . Viral gastroenteritis due to [place] virus 05/13/2015   hospitalization    Past Surgical History:  Procedure Laterality Date  . ABDOMINAL HYSTERECTOMY  1975   TAH,BSO  . ABI  2013   R 0.91, L 0.83  . APPENDECTOMY     per pt report  . CARDIOVASCULAR STRESS TEST  2015   low risk myoview ([place])  . CATARACT SURGERY    . CHOLECYSTECTOMY    . COLONOSCOPY  02/2010   extensive diverticulosis throughout, small internal hemorrhoids, rec rpt 5 yrs (Dr. Allyn Kenner)  . CORONARY ARTERY BYPASS GRAFT  1998  . dexa scan  09/2011   femur -0.8, forearm 0.6 => normal  . ENDOVENOUS ABLATION SAPHENOUS VEIN W/ LASER Left 03/2014   Kellie Simmering  . ESI  2014   L S1 transforaminal ESI x2 ([place]) without improvement  . HAND SURGERY    .  lexiscan myoview  03/2011   Small basal inferolateral and anterolateral reversible perfusion defect suggests ischemia.  EF was normal.   old infarct, no new ischemia  . LOWER EXTREMITY ANGIOGRAPHY Right 09/06/2018   Procedure: LOWER EXTREMITY ANGIOGRAPHY;  Surgeon: Algernon Huxley, MD;  Location: [facility];  Service: Cardiovascular;  Laterality: Right;  . toe surgery    . TYMPANOPLASTY  1960s   R side  . Vault prolapse A & P repair  2009   Dr Rhodia Albright [facility]    Family History  Problem Relation Age of Onset  . Hypertension Mother   . Stroke Mother   . Diabetes Father   . Diabetes Sister   . Diabetes Brother   . Heart disease Son   . Colon cancer Neg Hx     Social History:  reports that she quit smoking about 40 years ago. She has never used smokeless tobacco. She reports that she does not drink alcohol and does not use drugs.  Review of Systems:  She again has issues with right leg edema from venous insufficiency Also on Lasix 40 mg  HYPERTENSION:  Previously on ramipril, now on metoprolol and diltiazem   BP Readings from Last 3 Encounters:  09/17/19 110/70  09/11/19 108/70  09/10/19 122/72    HYPERLIPIDEMIA: High LDL has been treated with pravastatin 20 mg  This is followed by cardiologist and PCP Last LDL below 70  Lab Results  Component Value Date   CHOL 115 02/04/2019   HDL 47.20 02/04/2019  LDLCALC 56 02/04/2019   TRIG 60.0 02/04/2019   CHOLHDL 2 02/04/2019    Last foot exam in 1/20      Examination:   BP 110/70 (BP Location: Left Arm, Patient Position: Sitting, Cuff Size: Normal)   Pulse 96   Ht 4\' 9"  (1.448 m)   Wt 142 lb (64.4 kg)   SpO2 97%   BMI 30.73 kg/m   Body mass index is 30.73 kg/m.   1+ left lower leg: 2+ right lower leg edema present Left submandibular salivary gland palpable but not tender.  No lymphadenopathy    ASSESSMENT/ PLAN:   DIABETES type 2 with obesity  A1c is 7.4  She is currently on Actos and  Januvia  By recall her blood sugars at home are fairly good Not clear if she is benefiting from adding Januvia and if she is taking 50 or 100 mg a day However because of her tendency to edema and recent finding of right heart failure will stop Actos She will now take Januvia 100 mg daily Although she may benefit from Metformin she had previously refused to take this  Her companion was reminded to have her bring her meter for download on next visit  She will discuss further issues with leg edema and left neck discomfort with her PCP  Follow-up in 3 months    There are no Patient Instructions on file for this visit.    Elayne Snare 09/17/2019, 2:16 PM      Note: This office note was prepared with Dragon voice recognition system technology. Any transcriptional errors that result from this process are unintentional.

## 2019-09-17 NOTE — Patient Instructions (Addendum)
Check blood sugars on waking up 2 days a week  Also check blood sugars about 2 hours after meals and do this after different meals by rotation  Recommended blood sugar levels on waking up are 90-130 and about 2 hours after meal is 130-180  Please bring your blood sugar monitor to each visit, thank you  STOP PIOGLITAZONE

## 2019-09-18 ENCOUNTER — Encounter: Payer: Self-pay | Admitting: Family Medicine

## 2019-09-18 ENCOUNTER — Other Ambulatory Visit: Payer: Self-pay

## 2019-09-18 ENCOUNTER — Ambulatory Visit (INDEPENDENT_AMBULATORY_CARE_PROVIDER_SITE_OTHER): Payer: Medicare Other | Admitting: Family Medicine

## 2019-09-18 VITALS — BP 118/62 | HR 97 | Temp 98.3°F | Ht <= 58 in | Wt 142.8 lb

## 2019-09-18 DIAGNOSIS — I5033 Acute on chronic diastolic (congestive) heart failure: Secondary | ICD-10-CM

## 2019-09-18 DIAGNOSIS — H6123 Impacted cerumen, bilateral: Secondary | ICD-10-CM | POA: Diagnosis not present

## 2019-09-18 DIAGNOSIS — I10 Essential (primary) hypertension: Secondary | ICD-10-CM

## 2019-09-18 DIAGNOSIS — I25119 Atherosclerotic heart disease of native coronary artery with unspecified angina pectoris: Secondary | ICD-10-CM | POA: Diagnosis not present

## 2019-09-18 NOTE — Progress Notes (Signed)
Subjective:    Patient ID: [name] BRIEL, female    DOB: November 29, 1927, 86 y.o.   MRN: [id number]  HPI Chief Complaint  Patient presents with  . Leg Swelling    right leg swelling and pain, worse than left. Some SOB, worse than her normal. Saw Dr Dwyane Dee 6/15 and was advised to follo wup with PCP asap. Pt keeps legs elevated   This is a 86 yo female who presents today for acute visit for increased leg swelling and above cc. Patient of Dr. Danise Mina.  She is brought in by her friend [place].  She lives with her longtime boyfriend and another elderly gentleman.  The patient had a hospitalization Sep 02, 2019 for shortness of breath, acute on chronic CHF.  Since that time she has been seen by Dr. Danise Mina, cardiology, and endocrinology.  She reports that her leg swelling is significantly improved from admission.  She has follow-up weekly by a home health nurse.  According to the patient and her friend, the nurse would like to come more frequently but the patient does not allow it. The patient seems to be a [place] confused regarding some of her medications.  She was advised to increase her furosemide to twice a day when she was seen by Dr. Danise Mina last week.  She has not increased the medication.  She handles all of her own medication administration.  She is unable to sleep lying down for a long time.  She sleeps in a lift chair with her legs elevated as much as possible.  She does not recline the head back very much.  She is able to do minor chores around her home such as breakfast preparation, washing dishes.  She tries to walk around several times a day.  She wears diabetic socks. She was seen by her endocrinologist yesterday.  The patient was under the impression that she needed to be seen acutely for right leg swelling.  There is no indication of this in his note. Patient and her friend noticed decreased hearing that has occurred recently.  She has a history of cerumen impaction.  They have called  for ENT appointment and are waiting to get in. Review of Systems Per HPI    Objective:   Physical Exam Vitals reviewed.  Constitutional:      General: She is not in acute distress.    Appearance: She is normal weight. She is ill-appearing (chronically). She is not toxic-appearing or diaphoretic.  HENT:     Head: Normocephalic and atraumatic.     Right Ear: There is impacted cerumen.     Left Ear: There is impacted cerumen.  Eyes:     Conjunctiva/sclera: Conjunctivae normal.  Cardiovascular:     Rate and Rhythm: Normal rate. Rhythm irregular.     Heart sounds: Normal heart sounds.  Pulmonary:     Effort: Pulmonary effort is normal.     Breath sounds: Normal breath sounds.  Musculoskeletal:     Right lower leg: Edema (2+ edema ) present.     Left lower leg: Edema (1+) present.  Skin:    General: Skin is warm and dry.     Comments: Skin over right medial calf slightly shiny.  No drainage or skin breakdown.  No dimpling.  Neurological:     Mental Status: She is alert.     Comments: Hard of hearing.  Normally conversive.  Answers questions appropriately.  Psychiatric:        Mood and Affect: Mood normal.  Behavior: Behavior normal.        Thought Content: Thought content normal.        Judgment: Judgment normal.       Blood pressure 118/62, pulse 97, temperature 98.3 F (36.8 C), temperature source Temporal, height 4\' 9"  (1.448 m), weight 142 lb 12.8 oz (64.8 kg), SpO2 95 %.  Wt Readings from Last 3 Encounters:  09/18/19 142 lb 12.8 oz (64.8 kg)  09/17/19 142 lb (64.4 kg)  09/11/19 140 lb (63.5 kg)       Assessment & Plan:  1. Essential hypertension - Basic Metabolic Panel; Future  2. Acute on chronic heart failure with preserved ejection fraction (HCC) -Weight is stable and she is weighing daily at home and understands instructions for weight gain -Reviewed medications and instruction for increasing furosemide to twice daily.  I think this will significantly  decrease her leg swelling.  Also discussed applying diabetic stockings as soon as she wakes up in the morning, elevating legs as much as possible while sitting and continuing to ambulate as much as possible -Return in 2 weeks for lab only visit for bmet -She has follow-up appointments on file with PCP, specialists - Basic Metabolic Panel; Future  3. Bilateral impacted cerumen -Good results with irrigation.  Follow-up with ENT if decreased hearing persists.  This visit occurred during the SARS-CoV-2 public health emergency.  Safety protocols were in place, including screening questions prior to the visit, additional usage of staff PPE, and extensive cleaning of exam room while observing appropriate contact time as indicated for disinfecting solutions.    Clarene Reamer, FNP-BC  [facility]  09/18/2019 4:13 PM

## 2019-09-18 NOTE — Patient Instructions (Addendum)
Good to see you today  Please increase your furosemide (Lasix) to two times a day  Keep your legs elevated as much as possible  Put your diabetic socks on first thing in the morning  Weigh every day  Follow up in 2 weeks for blood work

## 2019-09-20 ENCOUNTER — Telehealth: Payer: Self-pay

## 2019-09-20 DIAGNOSIS — I252 Old myocardial infarction: Secondary | ICD-10-CM | POA: Diagnosis not present

## 2019-09-20 DIAGNOSIS — I4892 Unspecified atrial flutter: Secondary | ICD-10-CM | POA: Diagnosis not present

## 2019-09-20 DIAGNOSIS — I2510 Atherosclerotic heart disease of native coronary artery without angina pectoris: Secondary | ICD-10-CM | POA: Diagnosis not present

## 2019-09-20 DIAGNOSIS — I5032 Chronic diastolic (congestive) heart failure: Secondary | ICD-10-CM | POA: Diagnosis not present

## 2019-09-20 DIAGNOSIS — I480 Paroxysmal atrial fibrillation: Secondary | ICD-10-CM | POA: Diagnosis not present

## 2019-09-20 DIAGNOSIS — I110 Hypertensive heart disease with heart failure: Secondary | ICD-10-CM | POA: Diagnosis not present

## 2019-09-20 MED ORDER — NYSTATIN 100000 UNIT/GM EX OINT: 1.0000 "application " | TOPICAL_OINTMENT | Freq: Two times a day (BID) | CUTANEOUS | 0 refills | Status: DC

## 2019-09-20 NOTE — Telephone Encounter (Signed)
Plain nystatin refill sent in. GYN may have done combination nystatin/steroid. If she wants this refilled, to contact them next week.

## 2019-09-20 NOTE — Telephone Encounter (Signed)
Pt said she has been calling her GYN to get a refill of her Nystatin cream. She said the office is closed. Supposedly, [facility] has sent requests for refills.  She is calling to see if Dr Danise Mina would do a refill.  I advised her that usually we do not do rxs if other providers are writing it, but I would ask Dr Danise Mina for her.

## 2019-09-20 NOTE — Telephone Encounter (Signed)
Spoke to pt. She appreciated the medication.

## 2019-09-23 ENCOUNTER — Other Ambulatory Visit: Payer: Self-pay | Admitting: Family Medicine

## 2019-09-23 DIAGNOSIS — I872 Venous insufficiency (chronic) (peripheral): Secondary | ICD-10-CM | POA: Diagnosis not present

## 2019-09-23 DIAGNOSIS — M810 Age-related osteoporosis without current pathological fracture: Secondary | ICD-10-CM

## 2019-09-23 DIAGNOSIS — M5136 Other intervertebral disc degeneration, lumbar region: Secondary | ICD-10-CM | POA: Diagnosis not present

## 2019-09-23 DIAGNOSIS — E1165 Type 2 diabetes mellitus with hyperglycemia: Secondary | ICD-10-CM | POA: Diagnosis not present

## 2019-09-23 DIAGNOSIS — Z85828 Personal history of other malignant neoplasm of skin: Secondary | ICD-10-CM

## 2019-09-23 DIAGNOSIS — Z9049 Acquired absence of other specified parts of digestive tract: Secondary | ICD-10-CM

## 2019-09-23 DIAGNOSIS — I110 Hypertensive heart disease with heart failure: Secondary | ICD-10-CM | POA: Diagnosis not present

## 2019-09-23 DIAGNOSIS — I2510 Atherosclerotic heart disease of native coronary artery without angina pectoris: Secondary | ICD-10-CM | POA: Diagnosis not present

## 2019-09-23 DIAGNOSIS — I480 Paroxysmal atrial fibrillation: Secondary | ICD-10-CM | POA: Diagnosis not present

## 2019-09-23 DIAGNOSIS — E785 Hyperlipidemia, unspecified: Secondary | ICD-10-CM

## 2019-09-23 DIAGNOSIS — R296 Repeated falls: Secondary | ICD-10-CM

## 2019-09-23 DIAGNOSIS — Z86718 Personal history of other venous thrombosis and embolism: Secondary | ICD-10-CM

## 2019-09-23 DIAGNOSIS — F329 Major depressive disorder, single episode, unspecified: Secondary | ICD-10-CM

## 2019-09-23 DIAGNOSIS — I252 Old myocardial infarction: Secondary | ICD-10-CM | POA: Diagnosis not present

## 2019-09-23 DIAGNOSIS — K589 Irritable bowel syndrome without diarrhea: Secondary | ICD-10-CM | POA: Diagnosis not present

## 2019-09-23 DIAGNOSIS — Z90710 Acquired absence of both cervix and uterus: Secondary | ICD-10-CM

## 2019-09-23 DIAGNOSIS — Z950 Presence of cardiac pacemaker: Secondary | ICD-10-CM

## 2019-09-23 DIAGNOSIS — E1151 Type 2 diabetes mellitus with diabetic peripheral angiopathy without gangrene: Secondary | ICD-10-CM | POA: Diagnosis not present

## 2019-09-23 DIAGNOSIS — D509 Iron deficiency anemia, unspecified: Secondary | ICD-10-CM

## 2019-09-23 DIAGNOSIS — I4892 Unspecified atrial flutter: Secondary | ICD-10-CM | POA: Diagnosis not present

## 2019-09-23 DIAGNOSIS — K5790 Diverticulosis of intestine, part unspecified, without perforation or abscess without bleeding: Secondary | ICD-10-CM | POA: Diagnosis not present

## 2019-09-23 DIAGNOSIS — I5032 Chronic diastolic (congestive) heart failure: Secondary | ICD-10-CM | POA: Diagnosis not present

## 2019-09-23 DIAGNOSIS — F411 Generalized anxiety disorder: Secondary | ICD-10-CM

## 2019-09-23 DIAGNOSIS — K279 Peptic ulcer, site unspecified, unspecified as acute or chronic, without hemorrhage or perforation: Secondary | ICD-10-CM

## 2019-09-24 ENCOUNTER — Telehealth: Payer: Self-pay

## 2019-09-24 NOTE — Telephone Encounter (Signed)
Please call and offer appt for further evaluation of below symptoms.

## 2019-09-24 NOTE — Telephone Encounter (Signed)
Pt's significant other, [name], called and stated that the patient has been having increased urination issues and is getting up throughout the night numerous times to use the bathroom. He also stated that the pt's leg has been "red". He wanted to know if the patient should return to see Dr. Dwyane Dee.  [name] was advised that Dr. Dwyane Dee would most likely defer these symptoms to the pt's PCP because they seem to be unrelated to the pt's diabetes, however, Dr. Dwyane Dee would be notified and if he has anything else to add or if he would like to address this issue, a return phone call will be given. [name] verbalized understanding of this and did not voice any additional questions, comments, or concerns at this time.

## 2019-09-24 NOTE — Telephone Encounter (Signed)
Attempted to contact pt.  No answer.  Home/cell no vm.  Needs OV for frequent urination and leg redness.

## 2019-09-24 NOTE — Telephone Encounter (Signed)
Noted. This is what was told to pt's significant other.

## 2019-09-24 NOTE — Telephone Encounter (Signed)
She will need to be seen by her PCP

## 2019-09-25 ENCOUNTER — Ambulatory Visit: Payer: Medicare Other | Admitting: Cardiovascular Disease

## 2019-09-25 DIAGNOSIS — I252 Old myocardial infarction: Secondary | ICD-10-CM | POA: Diagnosis not present

## 2019-09-25 DIAGNOSIS — I110 Hypertensive heart disease with heart failure: Secondary | ICD-10-CM | POA: Diagnosis not present

## 2019-09-25 DIAGNOSIS — I5032 Chronic diastolic (congestive) heart failure: Secondary | ICD-10-CM | POA: Diagnosis not present

## 2019-09-25 DIAGNOSIS — I2510 Atherosclerotic heart disease of native coronary artery without angina pectoris: Secondary | ICD-10-CM | POA: Diagnosis not present

## 2019-09-25 DIAGNOSIS — I4892 Unspecified atrial flutter: Secondary | ICD-10-CM | POA: Diagnosis not present

## 2019-09-25 DIAGNOSIS — I480 Paroxysmal atrial fibrillation: Secondary | ICD-10-CM | POA: Diagnosis not present

## 2019-09-25 NOTE — Telephone Encounter (Signed)
Attempted to contact pt.  No answer.  Home/cell no vm.  Needs OV for frequent urination and leg redness.

## 2019-09-26 DIAGNOSIS — I2510 Atherosclerotic heart disease of native coronary artery without angina pectoris: Secondary | ICD-10-CM | POA: Diagnosis not present

## 2019-09-26 DIAGNOSIS — I110 Hypertensive heart disease with heart failure: Secondary | ICD-10-CM | POA: Diagnosis not present

## 2019-09-26 DIAGNOSIS — I5032 Chronic diastolic (congestive) heart failure: Secondary | ICD-10-CM | POA: Diagnosis not present

## 2019-09-26 DIAGNOSIS — I480 Paroxysmal atrial fibrillation: Secondary | ICD-10-CM | POA: Diagnosis not present

## 2019-09-26 DIAGNOSIS — I252 Old myocardial infarction: Secondary | ICD-10-CM | POA: Diagnosis not present

## 2019-09-26 DIAGNOSIS — I4892 Unspecified atrial flutter: Secondary | ICD-10-CM | POA: Diagnosis not present

## 2019-09-26 NOTE — Telephone Encounter (Signed)
Pt scheduled on 10/01/19 at 3:30.

## 2019-09-28 DIAGNOSIS — I2510 Atherosclerotic heart disease of native coronary artery without angina pectoris: Secondary | ICD-10-CM | POA: Diagnosis not present

## 2019-09-28 DIAGNOSIS — I252 Old myocardial infarction: Secondary | ICD-10-CM | POA: Diagnosis not present

## 2019-09-28 DIAGNOSIS — I110 Hypertensive heart disease with heart failure: Secondary | ICD-10-CM | POA: Diagnosis not present

## 2019-09-28 DIAGNOSIS — I480 Paroxysmal atrial fibrillation: Secondary | ICD-10-CM | POA: Diagnosis not present

## 2019-09-28 DIAGNOSIS — I5032 Chronic diastolic (congestive) heart failure: Secondary | ICD-10-CM | POA: Diagnosis not present

## 2019-09-28 DIAGNOSIS — I4892 Unspecified atrial flutter: Secondary | ICD-10-CM | POA: Diagnosis not present

## 2019-09-30 DIAGNOSIS — I4892 Unspecified atrial flutter: Secondary | ICD-10-CM | POA: Diagnosis not present

## 2019-09-30 DIAGNOSIS — I2510 Atherosclerotic heart disease of native coronary artery without angina pectoris: Secondary | ICD-10-CM | POA: Diagnosis not present

## 2019-09-30 DIAGNOSIS — I252 Old myocardial infarction: Secondary | ICD-10-CM | POA: Diagnosis not present

## 2019-09-30 DIAGNOSIS — I480 Paroxysmal atrial fibrillation: Secondary | ICD-10-CM | POA: Diagnosis not present

## 2019-09-30 DIAGNOSIS — I5032 Chronic diastolic (congestive) heart failure: Secondary | ICD-10-CM | POA: Diagnosis not present

## 2019-09-30 DIAGNOSIS — I110 Hypertensive heart disease with heart failure: Secondary | ICD-10-CM | POA: Diagnosis not present

## 2019-10-01 ENCOUNTER — Ambulatory Visit (INDEPENDENT_AMBULATORY_CARE_PROVIDER_SITE_OTHER): Payer: Medicare Other | Admitting: Family Medicine

## 2019-10-01 ENCOUNTER — Encounter: Payer: Self-pay | Admitting: Family Medicine

## 2019-10-01 ENCOUNTER — Other Ambulatory Visit: Payer: Self-pay

## 2019-10-01 VITALS — BP 118/76 | HR 71 | Temp 97.5°F | Ht <= 58 in | Wt 140.2 lb

## 2019-10-01 DIAGNOSIS — I25119 Atherosclerotic heart disease of native coronary artery with unspecified angina pectoris: Secondary | ICD-10-CM

## 2019-10-01 DIAGNOSIS — M79604 Pain in right leg: Secondary | ICD-10-CM

## 2019-10-01 DIAGNOSIS — I5032 Chronic diastolic (congestive) heart failure: Secondary | ICD-10-CM | POA: Diagnosis not present

## 2019-10-01 DIAGNOSIS — R350 Frequency of micturition: Secondary | ICD-10-CM

## 2019-10-01 DIAGNOSIS — E1151 Type 2 diabetes mellitus with diabetic peripheral angiopathy without gangrene: Secondary | ICD-10-CM

## 2019-10-01 DIAGNOSIS — H9193 Unspecified hearing loss, bilateral: Secondary | ICD-10-CM

## 2019-10-01 DIAGNOSIS — I872 Venous insufficiency (chronic) (peripheral): Secondary | ICD-10-CM

## 2019-10-01 DIAGNOSIS — N390 Urinary tract infection, site not specified: Secondary | ICD-10-CM | POA: Insufficient documentation

## 2019-10-01 DIAGNOSIS — M79605 Pain in left leg: Secondary | ICD-10-CM

## 2019-10-01 DIAGNOSIS — G8929 Other chronic pain: Secondary | ICD-10-CM

## 2019-10-01 LAB — POC URINALSYSI DIPSTICK (AUTOMATED)
Bilirubin, UA: NEGATIVE
Blood, UA: NEGATIVE
Glucose, UA: NEGATIVE
Ketones, UA: NEGATIVE
Leukocytes, UA: NEGATIVE
Nitrite, UA: NEGATIVE
Protein, UA: NEGATIVE
Spec Grav, UA: 1.015 (ref 1.010–1.025)
Urobilinogen, UA: 0.2 E.U./dL
pH, UA: 6 (ref 5.0–8.0)

## 2019-10-01 NOTE — Assessment & Plan Note (Signed)
Legs actually looking very good today - without signs of inflammation or erythema. Continue compression stocking use and encourage leg elevation when able.

## 2019-10-01 NOTE — Patient Instructions (Addendum)
Legs are looking good today - continue to keep legs elevated.  Continue lasix 40mg  twice daily  Take lasix at 8am and then again at 1-2 pm. If you take it too late at night, it will make you urinate too much at night time.  Urine test today.  Work on fixing hearing aides

## 2019-10-01 NOTE — Assessment & Plan Note (Signed)
UA normal today - anticipate symptoms coming from too late lasix at night time leading to increased nocturia. Reviewed optimal time for lasix administration.

## 2019-10-01 NOTE — Progress Notes (Signed)
This visit was conducted in person.  BP 118/76 (BP Location: Left Arm, Patient Position: Sitting, Cuff Size: Normal)    Pulse 71    Temp (!) 97.5 F (36.4 C) (Temporal)    Ht 4\' 9"  (1.448 m)    Wt 140 lb 3 oz (63.6 kg)    SpO2 96%    BMI 30.34 kg/m    CC: urinary symptoms Subjective:    [name], female    DOB: Nov 09, 1927, 86 y.o.   MRN: [id number]  HPI: [name] is a 86 y.o. female presenting on 10/01/2019 for Urinary Frequency (C/o having to urinate a lot.  Pt accompanied by friend, [name]- temp 97.9.) and Leg Problem (C/o right low leg redness and soreness. )   See prior notes for details.  Increased urinary symptoms ongoing for the past week as well as worsening redness to lower extremity. Patient was unable to come in to be seen until today.   Denies dysuria, blood in urine, abd pain, fevers/chills, nausea/vomiting.  Some constipation.   Increased nocturia - takes lasix at 8am and 6pm.  Sleeping in lift chair - worried about falling out of bed.   Legs are hurting, R leg is red.  Ongoing dyspnea. Upcoming appt with cardiology.      Relevant past medical, surgical, family and social history reviewed and updated as indicated. Interim medical history since our last visit reviewed. Allergies and medications reviewed and updated. Outpatient Medications Prior to Visit  Medication Sig Dispense Refill   acetaminophen (TYLENOL) 500 MG tablet Take 500 mg by mouth every 6 (six) hours as needed for mild pain.     ALPRAZolam (XANAX) 1 MG tablet Take 0.5 tablets (0.5 mg total) by mouth 4 (four) times daily as needed for anxiety.     apixaban (ELIQUIS) 5 MG TABS tablet Take 1 tablet (5 mg total) by mouth 2 (two) times daily. 60 tablet 11   B Complex-C (B COMPLEX-VITAMIN C PO) Take 1 tablet by mouth daily. +zinc      diclofenac sodium (VOLTAREN) 1 % GEL Apply 1 application topically 3 (three) times daily. 1 Tube 1   diltiazem (DILACOR XR) 120 MG 24 hr capsule  Take 1 capsule (120 mg total) by mouth daily. 30 capsule 6   famotidine (PEPCID) 20 MG tablet Take 1 tablet (20 mg total) by mouth daily as needed for heartburn (breakthrough heartburn).     furosemide (LASIX) 40 MG tablet Take 1 tablet (40 mg total) by mouth 2 (two) times daily. 60 tablet 0   glucosamine-chondroitin 500-400 MG tablet Take 1 tablet by mouth daily.      glucose blood (ONETOUCH VERIO) test strip USE TO TEST BLOOD SUGAR ONCE DAILY. DX:E11.9 100 each 6   HYDROcodone-acetaminophen (NORCO/VICODIN) 5-325 MG tablet Take 0.5 tablets by mouth 2 (two) times daily as needed for moderate pain or severe pain. (Patient taking differently: Take 0.5 tablets by mouth every 6 (six) hours as needed for moderate pain or severe pain. ) 30 tablet 0   loperamide (IMODIUM) 2 MG capsule Take 2 mg by mouth 4 (four) times daily as needed for diarrhea or loose stools.     loratadine (CLARITIN) 10 MG tablet Take 10 mg by mouth daily as needed for allergies.     metoprolol tartrate (LOPRESSOR) 50 MG tablet Take 50 mg by mouth 2 (two) times daily.      Multiple Vitamins-Minerals (ICAPS) CAPS Take 1 capsule by mouth daily.  nitroGLYCERIN (NITROSTAT) 0.4 MG SL tablet Place 1 tablet (0.4 mg total) under the tongue every 5 (five) minutes as needed for chest pain. 25 tablet 3   nystatin ointment (MYCOSTATIN) Apply 1 application topically 2 (two) times daily. 30 g 0   nystatin-triamcinolone ointment (MYCOLOG) Apply to affected area twice a day as needed for itching     OXcarbazepine (TRILEPTAL) 150 MG tablet Take 150 mg by mouth daily. Every evening     pantoprazole (PROTONIX) 40 MG tablet TAKE 1 TABLET BY MOUTH TWICE A DAY (Patient taking differently: Take 40 mg by mouth daily. ) 180 tablet 2   polyethylene glycol powder (GLYCOLAX/MIRALAX) powder Take 17 g by mouth daily. Hold for diarrhea 3350 g 1   potassium chloride (MICRO-K) 10 MEQ CR capsule TAKE 1 CAPSULE BY MOUTH EVERY DAY AS DIRECTED 30  capsule 1   pravastatin (PRAVACHOL) 20 MG tablet TAKE 1 TABLET BY MOUTH EVERY DAY (Patient taking differently: Take 20 mg by mouth every evening. ) 90 tablet 1   Probiotic Product (ALIGN) 4 MG CAPS Take 1 capsule (4 mg total) by mouth daily.     sitaGLIPtin (JANUVIA) 100 MG tablet Take 1 tablet by mouth once daily. 30 tablet 2   traZODone (DESYREL) 150 MG tablet Take 0.5 tablets (75 mg total) by mouth at bedtime.     vitamin E 1000 UNIT capsule Take 1,000 Units by mouth daily.     fluticasone (FLONASE) 50 MCG/ACT nasal spray Place into both nostrils.     No facility-administered medications prior to visit.     Per HPI unless specifically indicated in ROS section below Review of Systems Objective:  BP 118/76 (BP Location: Left Arm, Patient Position: Sitting, Cuff Size: Normal)    Pulse 71    Temp (!) 97.5 F (36.4 C) (Temporal)    Ht 4\' 9"  (1.448 m)    Wt 140 lb 3 oz (63.6 kg)    SpO2 96%    BMI 30.34 kg/m   Wt Readings from Last 3 Encounters:  10/01/19 140 lb 3 oz (63.6 kg)  09/18/19 142 lb 12.8 oz (64.8 kg)  09/17/19 142 lb (64.4 kg)      Physical Exam Vitals and nursing note reviewed.  Constitutional:      Appearance: Normal appearance. She is not ill-appearing.     Comments: Ambulates with walker  Cardiovascular:     Rate and Rhythm: Normal rate and regular rhythm.     Pulses: Normal pulses.     Heart sounds: Normal heart sounds. No murmur heard.   Pulmonary:     Effort: Pulmonary effort is normal. No respiratory distress.     Breath sounds: Normal breath sounds. No wheezing, rhonchi or rales.  Musculoskeletal:     Right lower leg: Edema (tr) present.     Left lower leg: Edema (tr) present.  Skin:    General: Skin is warm and dry.     Findings: No erythema or rash.     Comments: No lower extremity redness or warmth  Psychiatric:        Mood and Affect: Mood normal.        Behavior: Behavior normal.       Results for orders placed or performed in visit on  10/01/19  POCT Urinalysis Dipstick (Automated)  Result Value Ref Range   Color, UA yellow    Clarity, UA clear    Glucose, UA Negative Negative   Bilirubin, UA negative    Ketones, UA negative  Spec Grav, UA 1.015 1.010 - 1.025   Blood, UA negative    pH, UA 6.0 5.0 - 8.0   Protein, UA Negative Negative   Urobilinogen, UA 0.2 0.2 or 1.0 E.U./dL   Nitrite, UA negative    Leukocytes, UA Negative Negative   *Note: Due to a large number of results and/or encounters for the requested time period, some results have not been displayed. A complete set of results can be found in Results Review.   Lab Results  Component Value Date   CREATININE 0.96 09/10/2019   BUN 24 (H) 09/10/2019   NA 137 09/10/2019   K 4.2 09/10/2019   CL 98 09/10/2019   CO2 32 09/10/2019    Assessment & Plan:  This visit occurred during the SARS-CoV-2 public health emergency.  Safety protocols were in place, including screening questions prior to the visit, additional usage of staff PPE, and extensive cleaning of exam room while observing appropriate contact time as indicated for disinfecting solutions.   Problem List Items Addressed This Visit    Urinary frequency - Primary    UA normal today - anticipate symptoms coming from too late lasix at night time leading to increased nocturia. Reviewed optimal time for lasix administration.      Relevant Orders   POCT Urinalysis Dipstick (Automated) (Completed)   Diabetes mellitus type 2 with peripheral artery disease (HCC)   Chronic venous insufficiency    Legs actually looking very good today - without signs of inflammation or erythema. Continue compression stocking use and encourage leg elevation when able.       Chronic leg pain   Bilateral hearing loss    Still has not gotten new hearing aides.       (HFpEF) heart failure with preserved ejection fraction (HCC)    Seems to be tolerating lasix 40mg  bid well. Continue current regimen.           No orders  of the defined types were placed in this encounter.  Orders Placed This Encounter  Procedures   POCT Urinalysis Dipstick (Automated)    Patient Instructions  Legs are looking good today - continue to keep legs elevated.  Continue lasix 40mg  twice daily  Take lasix at 8am and then again at 1-2 pm. If you take it too late at night, it will make you urinate too much at night time.  Urine test today.  Work on fixing hearing aides   Follow up plan: Return if symptoms worsen or fail to improve.  Ria Bush, MD

## 2019-10-01 NOTE — Assessment & Plan Note (Signed)
Seems to be tolerating lasix 40mg  bid well. Continue current regimen.

## 2019-10-01 NOTE — Assessment & Plan Note (Signed)
Still has not gotten new hearing aides.

## 2019-10-02 DIAGNOSIS — I4892 Unspecified atrial flutter: Secondary | ICD-10-CM | POA: Diagnosis not present

## 2019-10-02 DIAGNOSIS — I5032 Chronic diastolic (congestive) heart failure: Secondary | ICD-10-CM | POA: Diagnosis not present

## 2019-10-02 DIAGNOSIS — I2510 Atherosclerotic heart disease of native coronary artery without angina pectoris: Secondary | ICD-10-CM | POA: Diagnosis not present

## 2019-10-02 DIAGNOSIS — I480 Paroxysmal atrial fibrillation: Secondary | ICD-10-CM | POA: Diagnosis not present

## 2019-10-02 DIAGNOSIS — I110 Hypertensive heart disease with heart failure: Secondary | ICD-10-CM | POA: Diagnosis not present

## 2019-10-02 DIAGNOSIS — I252 Old myocardial infarction: Secondary | ICD-10-CM | POA: Diagnosis not present

## 2019-10-03 DIAGNOSIS — I110 Hypertensive heart disease with heart failure: Secondary | ICD-10-CM | POA: Diagnosis not present

## 2019-10-03 DIAGNOSIS — I4892 Unspecified atrial flutter: Secondary | ICD-10-CM | POA: Diagnosis not present

## 2019-10-03 DIAGNOSIS — I252 Old myocardial infarction: Secondary | ICD-10-CM | POA: Diagnosis not present

## 2019-10-03 DIAGNOSIS — I5032 Chronic diastolic (congestive) heart failure: Secondary | ICD-10-CM | POA: Diagnosis not present

## 2019-10-03 DIAGNOSIS — I2510 Atherosclerotic heart disease of native coronary artery without angina pectoris: Secondary | ICD-10-CM | POA: Diagnosis not present

## 2019-10-03 DIAGNOSIS — I480 Paroxysmal atrial fibrillation: Secondary | ICD-10-CM | POA: Diagnosis not present

## 2019-10-04 ENCOUNTER — Ambulatory Visit: Payer: Medicare Other | Admitting: Cardiovascular Disease

## 2019-10-07 DIAGNOSIS — K589 Irritable bowel syndrome without diarrhea: Secondary | ICD-10-CM | POA: Diagnosis not present

## 2019-10-07 DIAGNOSIS — Z87891 Personal history of nicotine dependence: Secondary | ICD-10-CM | POA: Diagnosis not present

## 2019-10-07 DIAGNOSIS — I252 Old myocardial infarction: Secondary | ICD-10-CM | POA: Diagnosis not present

## 2019-10-07 DIAGNOSIS — R296 Repeated falls: Secondary | ICD-10-CM | POA: Diagnosis not present

## 2019-10-07 DIAGNOSIS — M5136 Other intervertebral disc degeneration, lumbar region: Secondary | ICD-10-CM | POA: Diagnosis not present

## 2019-10-07 DIAGNOSIS — Z9049 Acquired absence of other specified parts of digestive tract: Secondary | ICD-10-CM | POA: Diagnosis not present

## 2019-10-07 DIAGNOSIS — Z950 Presence of cardiac pacemaker: Secondary | ICD-10-CM | POA: Diagnosis not present

## 2019-10-07 DIAGNOSIS — D509 Iron deficiency anemia, unspecified: Secondary | ICD-10-CM | POA: Diagnosis not present

## 2019-10-07 DIAGNOSIS — I2510 Atherosclerotic heart disease of native coronary artery without angina pectoris: Secondary | ICD-10-CM | POA: Diagnosis not present

## 2019-10-07 DIAGNOSIS — M810 Age-related osteoporosis without current pathological fracture: Secondary | ICD-10-CM | POA: Diagnosis not present

## 2019-10-07 DIAGNOSIS — I5032 Chronic diastolic (congestive) heart failure: Secondary | ICD-10-CM | POA: Diagnosis not present

## 2019-10-07 DIAGNOSIS — I872 Venous insufficiency (chronic) (peripheral): Secondary | ICD-10-CM | POA: Diagnosis not present

## 2019-10-07 DIAGNOSIS — I110 Hypertensive heart disease with heart failure: Secondary | ICD-10-CM | POA: Diagnosis not present

## 2019-10-07 DIAGNOSIS — E785 Hyperlipidemia, unspecified: Secondary | ICD-10-CM | POA: Diagnosis not present

## 2019-10-07 DIAGNOSIS — E1151 Type 2 diabetes mellitus with diabetic peripheral angiopathy without gangrene: Secondary | ICD-10-CM | POA: Diagnosis not present

## 2019-10-07 DIAGNOSIS — I4892 Unspecified atrial flutter: Secondary | ICD-10-CM | POA: Diagnosis not present

## 2019-10-07 DIAGNOSIS — Z85828 Personal history of other malignant neoplasm of skin: Secondary | ICD-10-CM | POA: Diagnosis not present

## 2019-10-07 DIAGNOSIS — E1165 Type 2 diabetes mellitus with hyperglycemia: Secondary | ICD-10-CM | POA: Diagnosis not present

## 2019-10-07 DIAGNOSIS — K5790 Diverticulosis of intestine, part unspecified, without perforation or abscess without bleeding: Secondary | ICD-10-CM | POA: Diagnosis not present

## 2019-10-07 DIAGNOSIS — Z90710 Acquired absence of both cervix and uterus: Secondary | ICD-10-CM | POA: Diagnosis not present

## 2019-10-07 DIAGNOSIS — Z86718 Personal history of other venous thrombosis and embolism: Secondary | ICD-10-CM | POA: Diagnosis not present

## 2019-10-07 DIAGNOSIS — F329 Major depressive disorder, single episode, unspecified: Secondary | ICD-10-CM | POA: Diagnosis not present

## 2019-10-07 DIAGNOSIS — K279 Peptic ulcer, site unspecified, unspecified as acute or chronic, without hemorrhage or perforation: Secondary | ICD-10-CM | POA: Diagnosis not present

## 2019-10-07 DIAGNOSIS — I480 Paroxysmal atrial fibrillation: Secondary | ICD-10-CM | POA: Diagnosis not present

## 2019-10-07 DIAGNOSIS — F411 Generalized anxiety disorder: Secondary | ICD-10-CM | POA: Diagnosis not present

## 2019-10-08 ENCOUNTER — Other Ambulatory Visit: Payer: Self-pay

## 2019-10-08 ENCOUNTER — Other Ambulatory Visit (INDEPENDENT_AMBULATORY_CARE_PROVIDER_SITE_OTHER): Payer: Medicare Other

## 2019-10-08 DIAGNOSIS — I10 Essential (primary) hypertension: Secondary | ICD-10-CM

## 2019-10-08 DIAGNOSIS — I5033 Acute on chronic diastolic (congestive) heart failure: Secondary | ICD-10-CM

## 2019-10-08 LAB — BASIC METABOLIC PANEL
BUN: 27 mg/dL — ABNORMAL HIGH (ref 6–23)
CO2: 32 mEq/L (ref 19–32)
Calcium: 9.5 mg/dL (ref 8.4–10.5)
Chloride: 99 mEq/L (ref 96–112)
Creatinine, Ser: 1.1 mg/dL (ref 0.40–1.20)
GFR: 46.63 mL/min — ABNORMAL LOW (ref >60.00)
Glucose, Bld: 198 mg/dL — ABNORMAL HIGH (ref 70–99)
Potassium: 3.8 mEq/L (ref 3.5–5.1)
Sodium: 139 mEq/L (ref 135–145)

## 2019-10-09 ENCOUNTER — Other Ambulatory Visit: Payer: Self-pay | Admitting: Family Medicine

## 2019-10-09 DIAGNOSIS — I480 Paroxysmal atrial fibrillation: Secondary | ICD-10-CM | POA: Diagnosis not present

## 2019-10-09 DIAGNOSIS — I2510 Atherosclerotic heart disease of native coronary artery without angina pectoris: Secondary | ICD-10-CM | POA: Diagnosis not present

## 2019-10-09 DIAGNOSIS — I110 Hypertensive heart disease with heart failure: Secondary | ICD-10-CM | POA: Diagnosis not present

## 2019-10-09 DIAGNOSIS — I5032 Chronic diastolic (congestive) heart failure: Secondary | ICD-10-CM | POA: Diagnosis not present

## 2019-10-09 DIAGNOSIS — I4892 Unspecified atrial flutter: Secondary | ICD-10-CM | POA: Diagnosis not present

## 2019-10-09 DIAGNOSIS — I252 Old myocardial infarction: Secondary | ICD-10-CM | POA: Diagnosis not present

## 2019-10-09 MED ORDER — FUROSEMIDE 40 MG PO TABS: 40.0000 mg | ORAL_TABLET | Freq: Two times a day (BID) | ORAL | 1 refills | Status: DC

## 2019-10-09 MED ORDER — POTASSIUM CHLORIDE ER 10 MEQ PO CPCR: 10.0000 meq | ORAL_CAPSULE | Freq: Every day | ORAL | 1 refills | Status: DC

## 2019-10-11 ENCOUNTER — Telehealth: Payer: Self-pay

## 2019-10-11 DIAGNOSIS — I4892 Unspecified atrial flutter: Secondary | ICD-10-CM | POA: Diagnosis not present

## 2019-10-11 DIAGNOSIS — I5032 Chronic diastolic (congestive) heart failure: Secondary | ICD-10-CM | POA: Diagnosis not present

## 2019-10-11 DIAGNOSIS — I252 Old myocardial infarction: Secondary | ICD-10-CM | POA: Diagnosis not present

## 2019-10-11 DIAGNOSIS — I110 Hypertensive heart disease with heart failure: Secondary | ICD-10-CM | POA: Diagnosis not present

## 2019-10-11 DIAGNOSIS — I480 Paroxysmal atrial fibrillation: Secondary | ICD-10-CM | POA: Diagnosis not present

## 2019-10-11 DIAGNOSIS — I2510 Atherosclerotic heart disease of native coronary artery without angina pectoris: Secondary | ICD-10-CM | POA: Diagnosis not present

## 2019-10-11 NOTE — Telephone Encounter (Signed)
[name] has been notified of VO.

## 2019-10-11 NOTE — Telephone Encounter (Signed)
I received a call from [place] with [facility] stating she would like VO to add nursing visits - 1x a week for 5 weeks, for medication management.  Dr. Darnell Level, is this ok? She can be reached back at [phone]

## 2019-10-11 NOTE — Telephone Encounter (Signed)
Agree with this. Thank you.  

## 2019-10-15 ENCOUNTER — Ambulatory Visit: Payer: Medicare Other | Admitting: Neurology

## 2019-10-16 DIAGNOSIS — I480 Paroxysmal atrial fibrillation: Secondary | ICD-10-CM | POA: Diagnosis not present

## 2019-10-16 DIAGNOSIS — I4892 Unspecified atrial flutter: Secondary | ICD-10-CM | POA: Diagnosis not present

## 2019-10-16 DIAGNOSIS — I252 Old myocardial infarction: Secondary | ICD-10-CM | POA: Diagnosis not present

## 2019-10-16 DIAGNOSIS — I110 Hypertensive heart disease with heart failure: Secondary | ICD-10-CM | POA: Diagnosis not present

## 2019-10-16 DIAGNOSIS — I5032 Chronic diastolic (congestive) heart failure: Secondary | ICD-10-CM | POA: Diagnosis not present

## 2019-10-16 DIAGNOSIS — I2510 Atherosclerotic heart disease of native coronary artery without angina pectoris: Secondary | ICD-10-CM | POA: Diagnosis not present

## 2019-10-18 DIAGNOSIS — I5032 Chronic diastolic (congestive) heart failure: Secondary | ICD-10-CM | POA: Diagnosis not present

## 2019-10-18 DIAGNOSIS — I480 Paroxysmal atrial fibrillation: Secondary | ICD-10-CM | POA: Diagnosis not present

## 2019-10-18 DIAGNOSIS — I2510 Atherosclerotic heart disease of native coronary artery without angina pectoris: Secondary | ICD-10-CM | POA: Diagnosis not present

## 2019-10-18 DIAGNOSIS — I252 Old myocardial infarction: Secondary | ICD-10-CM | POA: Diagnosis not present

## 2019-10-18 DIAGNOSIS — I4892 Unspecified atrial flutter: Secondary | ICD-10-CM | POA: Diagnosis not present

## 2019-10-18 DIAGNOSIS — I110 Hypertensive heart disease with heart failure: Secondary | ICD-10-CM | POA: Diagnosis not present

## 2019-10-21 DIAGNOSIS — I4892 Unspecified atrial flutter: Secondary | ICD-10-CM | POA: Diagnosis not present

## 2019-10-21 DIAGNOSIS — I252 Old myocardial infarction: Secondary | ICD-10-CM | POA: Diagnosis not present

## 2019-10-21 DIAGNOSIS — I480 Paroxysmal atrial fibrillation: Secondary | ICD-10-CM | POA: Diagnosis not present

## 2019-10-21 DIAGNOSIS — I110 Hypertensive heart disease with heart failure: Secondary | ICD-10-CM | POA: Diagnosis not present

## 2019-10-21 DIAGNOSIS — I2510 Atherosclerotic heart disease of native coronary artery without angina pectoris: Secondary | ICD-10-CM | POA: Diagnosis not present

## 2019-10-21 DIAGNOSIS — I5032 Chronic diastolic (congestive) heart failure: Secondary | ICD-10-CM | POA: Diagnosis not present

## 2019-10-22 ENCOUNTER — Ambulatory Visit (INDEPENDENT_AMBULATORY_CARE_PROVIDER_SITE_OTHER): Payer: Medicare Other | Admitting: Cardiovascular Disease

## 2019-10-22 ENCOUNTER — Other Ambulatory Visit: Payer: Self-pay

## 2019-10-22 ENCOUNTER — Encounter: Payer: Self-pay | Admitting: Cardiovascular Disease

## 2019-10-22 VITALS — BP 96/62 | HR 62 | Ht <= 58 in | Wt 139.6 lb

## 2019-10-22 DIAGNOSIS — I25119 Atherosclerotic heart disease of native coronary artery with unspecified angina pectoris: Secondary | ICD-10-CM

## 2019-10-22 DIAGNOSIS — I2510 Atherosclerotic heart disease of native coronary artery without angina pectoris: Secondary | ICD-10-CM | POA: Diagnosis not present

## 2019-10-22 NOTE — Patient Instructions (Signed)
Medication Instructions:  Your physician recommends that you continue on your current medications as directed. Please refer to the Current Medication list given to you today.  *If you need a refill on your cardiac medications before your next appointment, please call your pharmacy*   Lab Work: none If you have labs (blood work) drawn today and your tests are completely normal, you will receive your results only by: Marland Kitchen MyChart Message (if you have MyChart) OR . A paper copy in the mail If you have any lab test that is abnormal or we need to change your treatment, we will call you to review the results.   Testing/Procedures: none   Follow-Up: At [facility], you and your health needs are our priority.  As part of our continuing mission to provide you with exceptional heart care, we have created designated Provider Care Teams.  These Care Teams include your primary Cardiologist (physician) and Advanced Practice Providers (APPs -  Physician Assistants and Nurse Practitioners) who all work together to provide you with the care you need, when you need it.  We recommend signing up for the patient portal called "MyChart".  Sign up information is provided on this After Visit Summary.  MyChart is used to connect with patients for Virtual Visits (Telemedicine).  Patients are able to view lab/test results, encounter notes, upcoming appointments, etc.  Non-urgent messages can be sent to your provider as well.   To learn more about what you can do with MyChart, go to [email or web address].    Your next appointment:   6 month(s)  The format for your next appointment:   In Person  Provider:   Mertie Moores, MD   Other Instructions

## 2019-10-22 NOTE — Progress Notes (Signed)
[place] Date of Birth  [birth year] [facility] [address]    [place], [place]  [address] [phone]  Fax  [phone]   Problem list 1. Coronary artery disease-status post CABG in 1996 2. Diabetes mellitus 3. Hypertension 4. Hyperlipidemia 5. Right  DVT.    [name] is an 86 yo female with a Hx of CAD, CABG ( November 09, 1994), diabetes mellitus, anxiety, HTN, hyperlipidemia.   She's been under a lot of stress recently. Her son died last year. She's under a lot of stress with the extra expenses involved such as his funeral. She presented with some CP in December.  A stress myoview revealed lateral ischemia.  We offered cardiac cath but she did not want to have the cath. She presents today without any particular complaints. She has not had any episodes of chest pain or shortness of breath since I saw her a month ago. She does have occasional episodes of tachycardia. She also notes that she has some anxiety. She was last seen in January after having an abnormal stress test that revealed anterior apical ischemia. She refused cardiac catheterization at that time. She has not had any recent episodes of chest pain.  She does not want to have a cardiac catheterization since she's feeling so well.  She has had some chest burning that is nearly constant.  I suggest several times that she should consider getting the cath done and she changed to subject.  September 04, 2012:  [name] is doing well from a cardiac standpoint.  She is still working - despite having lots of back pain.  No CP .  She had some CP several years ago and had a mildly abnormal myoview.  She did not want to have a cardiac cath at that time and has not had any significant epidoses of CP since that time.  She continues to be under lots of stress  She complains of back pain, stomach pain, burning in her legs.    Nov. 24, 2014  Pt is doing OK from a cardiac standpoint.  Lots of stress - she has been  running the [employer] by herself for the past several months.  She is not getting enough rest.  Sleeping 4 1/2 hours at night - tries to get a nap for an hour.    September 09, 2013:  [name] is doing ok.  She is under lots of stress as usual.    Nov. 10, 2015:  [name] presents with various aches and pains.  She has a vericose vein in her leg that bled. She is on Xarelto for a right leg DVT on Sept. 30. .   CT angio a week later was negative for PE.   She has lots of anxiety.  Has lots of chest pain.  The pain is not pleuretic.  The pain is not associated with exercise.  She does not have any pain while she is working but more often occurs with stress.   She is living with 2 elderly men now and has lots of stressful issues relating to that.    This seems to be a major cause of her pain.  She does have 84 year old SVGs  September 23, 2014:  Doing well from a cardiac standpoint. Lots of anxiety .  Has a open right heel ulcer.   Jan. 9, 2017  [name] has been doing ok No CP.  No dyspnea Has had right ankle pain and swelling  Occasionally has faster than normal heart rate,  No associated dyspnea , CP or dizziness.  Has had an upper respiratory tract infection  Recently   October 19, 2015:  Has had a UTI since I last saw her.  Was in the hospital  Had new A-fib with RVR with the hospitalization  Has converted back to NSR .   Aug 03, 2016:  No serious cardiac symptoms.   Has occasional episodes of tachycardia   February 21, 2018:  Doing well from a cardiac standpoint .   Seen with Granddaughter in [place]. Has some palpitations.  Especially when she gets up too fast.    April 09, 2018:  Has redness and swelling of her right leg, Has an open woulnd,    Dropped a knife on her leg. Has finished her Abx treatment .    Sept. 10., 2020  [name] is seen for follow up of her CAD and leg edema  Feels that her HR is irreglual, Has been this way for 1-2 months  No CP    May 27, 2019: [name] is seen for follow-up of her coronary artery disease, atrial fibrillation.  She also has a history of diabetes mellitus. Forgot her hearing aids today .Marland Kitchen   Very difficult interview  Ate breakfast,  Did not eat lunch  Some weakness,   Some dyspnea   October 22, 2019:  [name] is seen today .  No cp. Has chronic dyspnea  Very hard of hearing Tangential in her conversation Very difficult conversatio  due to her hearing  Loss    Current Outpatient Medications on File Prior to Visit  Medication Sig Dispense Refill  . acetaminophen (TYLENOL) 500 MG tablet Take 500 mg by mouth every 6 (six) hours as needed for mild pain.    Marland Kitchen ALPRAZolam (XANAX) 1 MG tablet Take 0.5 tablets (0.5 mg total) by mouth 4 (four) times daily as needed for anxiety.    Marland Kitchen apixaban (ELIQUIS) 5 MG TABS tablet Take 1 tablet (5 mg total) by mouth 2 (two) times daily. 60 tablet 11  . B Complex-C (B COMPLEX-VITAMIN C PO) Take 1 tablet by mouth daily. +zinc     . diclofenac sodium (VOLTAREN) 1 % GEL Apply 1 application topically 3 (three) times daily. 1 Tube 1  . diltiazem (DILACOR XR) 120 MG 24 hr capsule Take 1 capsule (120 mg total) by mouth daily. 30 capsule 6  . famotidine (PEPCID) 20 MG tablet Take 1 tablet (20 mg total) by mouth daily as needed for heartburn (breakthrough heartburn).    . fluticasone (FLONASE) 50 MCG/ACT nasal spray Place into both nostrils.    . furosemide (LASIX) 40 MG tablet Take 1 tablet (40 mg total) by mouth 2 (two) times daily. 180 tablet 1  . glucosamine-chondroitin 500-400 MG tablet Take 1 tablet by mouth daily.     Marland Kitchen glucose blood (ONETOUCH VERIO) test strip USE TO TEST BLOOD SUGAR ONCE DAILY. DX:E11.9 100 each 6  . HYDROcodone-acetaminophen (NORCO/VICODIN) 5-325 MG tablet Take 0.5 tablets by mouth 2 (two) times daily as needed for moderate pain or severe pain. 30 tablet 0  . loperamide (IMODIUM) 2 MG capsule Take 2 mg by mouth 4 (four) times daily as needed for  diarrhea or loose stools.    Marland Kitchen loratadine (CLARITIN) 10 MG tablet Take 10 mg by mouth daily as needed for allergies.    . metoprolol tartrate (LOPRESSOR) 50 MG tablet Take 50 mg by mouth 2 (two) times daily.     Marland Kitchen  Multiple Vitamins-Minerals (ICAPS) CAPS Take 1 capsule by mouth daily.    . nitroGLYCERIN (NITROSTAT) 0.4 MG SL tablet Place 1 tablet (0.4 mg total) under the tongue every 5 (five) minutes as needed for chest pain. 25 tablet 3  . nystatin ointment (MYCOSTATIN) Apply 1 application topically 2 (two) times daily. 30 g 0  . nystatin-triamcinolone ointment (MYCOLOG) Apply to affected area twice a day as needed for itching    . OXcarbazepine (TRILEPTAL) 150 MG tablet Take 150 mg by mouth daily. Every evening    . pantoprazole (PROTONIX) 40 MG tablet TAKE 1 TABLET BY MOUTH TWICE A DAY 180 tablet 2  . pioglitazone (ACTOS) 15 MG tablet Take 15 mg by mouth daily.    . polyethylene glycol powder (GLYCOLAX/MIRALAX) powder Take 17 g by mouth daily. Hold for diarrhea 3350 g 1  . potassium chloride (MICRO-K) 10 MEQ CR capsule Take 1 capsule (10 mEq total) by mouth daily. 90 capsule 1  . pravastatin (PRAVACHOL) 20 MG tablet TAKE 1 TABLET BY MOUTH EVERY DAY 90 tablet 1  . Probiotic Product (ALIGN) 4 MG CAPS Take 1 capsule (4 mg total) by mouth daily.    . sitaGLIPtin (JANUVIA) 100 MG tablet Take 1 tablet by mouth once daily. 30 tablet 2  . traZODone (DESYREL) 150 MG tablet Take 0.5 tablets (75 mg total) by mouth at bedtime.    . vitamin E 1000 UNIT capsule Take 1,000 Units by mouth daily.     No current facility-administered medications on file prior to visit.    Allergies  Allergen Reactions  . Metformin And Related Diarrhea    Past Medical History:  Diagnosis Date  . Acute deep vein thrombosis (DVT) of distal vein of right lower extremity ([place]) 12/2013   Acute thrombus of R proximal gastrocnemius vein. Symptomatic provoked distal DVT  . Anxiety   . Basal cell carcinoma 10/30/2018   R nasal  bridge, treated with Moh's  . Cellulitis of leg, left 01/16/2014  . CHF (congestive heart failure) ([place])   . Chronic back pain 03/2012   lumbar DDD with herniation - s/p L S1 transforaminal ESI (10/2012 Dr. Sharlet Salina)  . Chronic venous insufficiency   . Coronary artery disease    CABG 1998  . Cystocele   . Depression   . Diabetes mellitus    type 2, followed by endo.  . Diastolic dysfunction   . Diverticulosis   . Hemorrhoids   . History of heart attack   . Hx of echocardiogram    a. Echo 10/13: mild LVH, EF 55-60%, Gr 1 diast dysfn, PASP 32  . Hyperlipidemia   . Hypertension   . Irritable bowel syndrome   . Microcytic anemia   . PAD (peripheral artery disease) ([place]) 2013   ABI: R 0.91, L 0.83  . Postmenopausal osteoporosis 01/2019   T score of -2.6  . PUD (peptic ulcer disease)   . Varicose veins   . Venous ulcer of leg ([place]) 12/12/2013   Required prolonged treatment by wound center  . Viral gastroenteritis due to [place] virus 05/13/2015   hospitalization    Past Surgical History:  Procedure Laterality Date  . ABDOMINAL HYSTERECTOMY  1975   TAH,BSO  . ABI  2013   R 0.91, L 0.83  . APPENDECTOMY     per pt report  . CARDIOVASCULAR STRESS TEST  2015   low risk myoview ([facility])  . CATARACT SURGERY    . CHOLECYSTECTOMY    . COLONOSCOPY  02/2010  extensive diverticulosis throughout, small internal hemorrhoids, rec rpt 5 yrs (Dr. Allyn Kenner)  . CORONARY ARTERY BYPASS GRAFT  1998  . dexa scan  09/2011   femur -0.8, forearm 0.6 => normal  . ENDOVENOUS ABLATION SAPHENOUS VEIN W/ LASER Left 03/2014   Kellie Simmering  . ESI  2014   L S1 transforaminal ESI x2 ([facility]) without improvement  . HAND SURGERY    . lexiscan myoview  03/2011   Small basal inferolateral and anterolateral reversible perfusion defect suggests ischemia.  EF was normal.   old infarct, no new ischemia  . LOWER EXTREMITY ANGIOGRAPHY Right 09/06/2018   Procedure: LOWER EXTREMITY ANGIOGRAPHY;  Surgeon: Algernon Huxley, MD;   Location: [facility];  Service: Cardiovascular;  Laterality: Right;  . toe surgery    . TYMPANOPLASTY  1960s   R side  . Vault prolapse A & P repair  2009   Dr Rhodia Albright [facility] hospital    Social History   Tobacco Use  Smoking Status Former Smoker  . Quit date: 03/05/1979  . Years since quitting: 40.6  Smokeless Tobacco Never Used    Social History   Substance and Sexual Activity  Alcohol Use No  . Alcohol/week: 0.0 standard drinks    Family History  Problem Relation Age of Onset  . Hypertension Mother   . Stroke Mother   . Diabetes Father   . Diabetes Sister   . Diabetes Brother   . Heart disease Son   . Colon cancer Neg Hx     Reviw of Systems:  Reviewed in the HPI.  All other systems are negative.   Physical Exam: Blood pressure 96/62, pulse 62, height 4\' 9"  (1.448 m), weight 139 lb 9.6 oz (63.3 kg), SpO2 96 %.  GEN:  Elderly female,   Very hard of hearing  HEENT: Normal NECK: No JVD; No carotid bruits LYMPHATICS: No lymphadenopathy CARDIAC: RRR , no murmurs, rubs, gallops RESPIRATORY:  Clear to auscultation without rales, wheezing or rhonchi  ABDOMEN: Soft, non-tender, non-distended MUSCULOSKELETAL:  No edema; No deformity  SKIN: Warm and dry NEUROLOGIC:   Deaf      ECG:      Assessment / Plan:   1. Coronary artery disease-status post CABG in 1996 - No angina ,  She does not appear to be having any significant unstable angina symptoms .  She is not a very good candidate for invasive procedures at this point    2. Paroxysmal atrial fib :     Continue eliqis    3. Shortness of breath:  Chronically dyspnea.   4. Hypertension - BP is low normal    5. Hyperlipidemia  -    6. Right  DVT.  -   7.  Right lower leg wound.  Sees the wound clinic   Mertie Moores, MD  10/22/2019 3:36 PM    [address],  [place], [place]  [address] Pager [phone] Phone: [phone]; Fax: [phone]

## 2019-10-24 DIAGNOSIS — I2510 Atherosclerotic heart disease of native coronary artery without angina pectoris: Secondary | ICD-10-CM | POA: Diagnosis not present

## 2019-10-24 DIAGNOSIS — I480 Paroxysmal atrial fibrillation: Secondary | ICD-10-CM | POA: Diagnosis not present

## 2019-10-24 DIAGNOSIS — I5032 Chronic diastolic (congestive) heart failure: Secondary | ICD-10-CM | POA: Diagnosis not present

## 2019-10-24 DIAGNOSIS — I4892 Unspecified atrial flutter: Secondary | ICD-10-CM | POA: Diagnosis not present

## 2019-10-24 DIAGNOSIS — I110 Hypertensive heart disease with heart failure: Secondary | ICD-10-CM | POA: Diagnosis not present

## 2019-10-24 DIAGNOSIS — I252 Old myocardial infarction: Secondary | ICD-10-CM | POA: Diagnosis not present

## 2019-10-28 DIAGNOSIS — I4892 Unspecified atrial flutter: Secondary | ICD-10-CM | POA: Diagnosis not present

## 2019-10-28 DIAGNOSIS — I5032 Chronic diastolic (congestive) heart failure: Secondary | ICD-10-CM | POA: Diagnosis not present

## 2019-10-28 DIAGNOSIS — I2510 Atherosclerotic heart disease of native coronary artery without angina pectoris: Secondary | ICD-10-CM | POA: Diagnosis not present

## 2019-10-28 DIAGNOSIS — I252 Old myocardial infarction: Secondary | ICD-10-CM | POA: Diagnosis not present

## 2019-10-28 DIAGNOSIS — I480 Paroxysmal atrial fibrillation: Secondary | ICD-10-CM | POA: Diagnosis not present

## 2019-10-28 DIAGNOSIS — I110 Hypertensive heart disease with heart failure: Secondary | ICD-10-CM | POA: Diagnosis not present

## 2019-10-29 DIAGNOSIS — I480 Paroxysmal atrial fibrillation: Secondary | ICD-10-CM | POA: Diagnosis not present

## 2019-10-29 DIAGNOSIS — I252 Old myocardial infarction: Secondary | ICD-10-CM | POA: Diagnosis not present

## 2019-10-29 DIAGNOSIS — I5032 Chronic diastolic (congestive) heart failure: Secondary | ICD-10-CM | POA: Diagnosis not present

## 2019-10-29 DIAGNOSIS — I110 Hypertensive heart disease with heart failure: Secondary | ICD-10-CM | POA: Diagnosis not present

## 2019-10-29 DIAGNOSIS — I4892 Unspecified atrial flutter: Secondary | ICD-10-CM | POA: Diagnosis not present

## 2019-10-29 DIAGNOSIS — I2510 Atherosclerotic heart disease of native coronary artery without angina pectoris: Secondary | ICD-10-CM | POA: Diagnosis not present

## 2019-11-05 ENCOUNTER — Encounter: Payer: Self-pay | Admitting: Family Medicine

## 2019-11-05 ENCOUNTER — Other Ambulatory Visit: Payer: Self-pay

## 2019-11-05 ENCOUNTER — Ambulatory Visit (INDEPENDENT_AMBULATORY_CARE_PROVIDER_SITE_OTHER): Payer: Medicare Other | Admitting: Family Medicine

## 2019-11-05 VITALS — BP 130/66 | HR 101 | Temp 97.6°F | Ht 59.0 in | Wt 139.4 lb

## 2019-11-05 DIAGNOSIS — I2510 Atherosclerotic heart disease of native coronary artery without angina pectoris: Secondary | ICD-10-CM | POA: Diagnosis not present

## 2019-11-05 DIAGNOSIS — I480 Paroxysmal atrial fibrillation: Secondary | ICD-10-CM | POA: Diagnosis not present

## 2019-11-05 DIAGNOSIS — M5442 Lumbago with sciatica, left side: Secondary | ICD-10-CM | POA: Diagnosis not present

## 2019-11-05 DIAGNOSIS — Z79899 Other long term (current) drug therapy: Secondary | ICD-10-CM

## 2019-11-05 DIAGNOSIS — H9193 Unspecified hearing loss, bilateral: Secondary | ICD-10-CM | POA: Diagnosis not present

## 2019-11-05 DIAGNOSIS — I872 Venous insufficiency (chronic) (peripheral): Secondary | ICD-10-CM | POA: Diagnosis not present

## 2019-11-05 DIAGNOSIS — I4892 Unspecified atrial flutter: Secondary | ICD-10-CM | POA: Diagnosis not present

## 2019-11-05 DIAGNOSIS — G8929 Other chronic pain: Secondary | ICD-10-CM

## 2019-11-05 DIAGNOSIS — M5441 Lumbago with sciatica, right side: Secondary | ICD-10-CM | POA: Diagnosis not present

## 2019-11-05 DIAGNOSIS — I25119 Atherosclerotic heart disease of native coronary artery with unspecified angina pectoris: Secondary | ICD-10-CM | POA: Diagnosis not present

## 2019-11-05 DIAGNOSIS — I5032 Chronic diastolic (congestive) heart failure: Secondary | ICD-10-CM | POA: Diagnosis not present

## 2019-11-05 DIAGNOSIS — I110 Hypertensive heart disease with heart failure: Secondary | ICD-10-CM | POA: Diagnosis not present

## 2019-11-05 DIAGNOSIS — I252 Old myocardial infarction: Secondary | ICD-10-CM | POA: Diagnosis not present

## 2019-11-05 MED ORDER — NYSTATIN 100000 UNIT/GM EX OINT: 1.0000 "application " | TOPICAL_OINTMENT | Freq: Two times a day (BID) | CUTANEOUS | 3 refills | Status: DC

## 2019-11-05 MED ORDER — HYDROCODONE-ACETAMINOPHEN 5-325 MG PO TABS: 0.5000 | ORAL_TABLET | Freq: Two times a day (BID) | ORAL | 0 refills | Status: DC | PRN

## 2019-11-05 NOTE — Assessment & Plan Note (Signed)
Legs look stable today - tr pedal edema but no erythema or warmth or sighs of cellulitis. Continue regular compression stocking use.

## 2019-11-05 NOTE — Assessment & Plan Note (Signed)
New L hearing aide with improvement in hearing. Remains hard of hearing however.

## 2019-11-05 NOTE — Assessment & Plan Note (Addendum)
[place] CSRS reviewed Tolerating hydrocodone well. Continue hydrocodone 1/2 tab bid (decrease from prior qid dosing).

## 2019-11-05 NOTE — Assessment & Plan Note (Signed)
Hydrocodone refilled.  

## 2019-11-05 NOTE — Patient Instructions (Addendum)
You are doing well today  Continue current medicines I've refilled hydrocodone.  Return as needed or in 3 months for wellness visit

## 2019-11-05 NOTE — Assessment & Plan Note (Signed)
Continue eliquis  ?

## 2019-11-05 NOTE — Progress Notes (Signed)
This visit was conducted in person.  BP 130/66 (BP Location: Right Arm, Patient Position: Sitting, Cuff Size: Normal)    Pulse (!) 101    Temp 97.6 F (36.4 C) (Temporal)    Ht 4\' 11"  (1.499 m)    Wt 139 lb 6 oz (63.2 kg)    SpO2 94%    BMI 28.15 kg/m    CC: 1 mo f/u visit "I sleep alot"  Subjective:    [name], female    DOB: [birth year], 86 y.o.   MRN: [id number]  HPI: [name] is a 86 y.o. female presenting on 11/05/2019 for Follow-up (Pt accompanied by friend, [name]- temp 97.8.)   Saw cardiology last month.  [facility] continues seeing patient (nursing to help with med management).  Takes lasix twice daily 8am and 4pm.  Ongoing lower leg pain worse at night time. She uses compression stockings regularly.   Saw audiology - new L sided hearing aide - helping hearing some but remains hard of hearing.      Relevant past medical, surgical, family and social history reviewed and updated as indicated. Interim medical history since our last visit reviewed. Allergies and medications reviewed and updated. Outpatient Medications Prior to Visit  Medication Sig Dispense Refill   acetaminophen (TYLENOL) 500 MG tablet Take 500 mg by mouth every 6 (six) hours as needed for mild pain.     ALPRAZolam (XANAX) 1 MG tablet Take 0.5 tablets (0.5 mg total) by mouth 4 (four) times daily as needed for anxiety.     apixaban (ELIQUIS) 5 MG TABS tablet Take 1 tablet (5 mg total) by mouth 2 (two) times daily. 60 tablet 11   B Complex-C (B COMPLEX-VITAMIN C PO) Take 1 tablet by mouth daily. +zinc      diclofenac sodium (VOLTAREN) 1 % GEL Apply 1 application topically 3 (three) times daily. 1 Tube 1   diltiazem (DILACOR XR) 120 MG 24 hr capsule Take 1 capsule (120 mg total) by mouth daily. 30 capsule 6   famotidine (PEPCID) 20 MG tablet Take 1 tablet (20 mg total) by mouth daily as needed for heartburn (breakthrough heartburn).     fluticasone (FLONASE) 50 MCG/ACT nasal  spray Place into both nostrils.     furosemide (LASIX) 40 MG tablet Take 1 tablet (40 mg total) by mouth 2 (two) times daily. 180 tablet 1   glucosamine-chondroitin 500-400 MG tablet Take 1 tablet by mouth daily.      glucose blood (ONETOUCH VERIO) test strip USE TO TEST BLOOD SUGAR ONCE DAILY. DX:E11.9 100 each 6   loperamide (IMODIUM) 2 MG capsule Take 2 mg by mouth 4 (four) times daily as needed for diarrhea or loose stools.     loratadine (CLARITIN) 10 MG tablet Take 10 mg by mouth daily as needed for allergies.     metoprolol tartrate (LOPRESSOR) 50 MG tablet Take 50 mg by mouth 2 (two) times daily.      Multiple Vitamins-Minerals (ICAPS) CAPS Take 1 capsule by mouth daily.     nitroGLYCERIN (NITROSTAT) 0.4 MG SL tablet Place 1 tablet (0.4 mg total) under the tongue every 5 (five) minutes as needed for chest pain. 25 tablet 3   nystatin-triamcinolone ointment (MYCOLOG) Apply to affected area twice a day as needed for itching     OXcarbazepine (TRILEPTAL) 150 MG tablet Take 150 mg by mouth daily. Every evening     pantoprazole (PROTONIX) 40 MG tablet TAKE 1 TABLET BY MOUTH TWICE  A DAY 180 tablet 2   pioglitazone (ACTOS) 15 MG tablet Take 15 mg by mouth daily.     polyethylene glycol powder (GLYCOLAX/MIRALAX) powder Take 17 g by mouth daily. Hold for diarrhea 3350 g 1   potassium chloride (MICRO-K) 10 MEQ CR capsule Take 1 capsule (10 mEq total) by mouth daily. 90 capsule 1   pravastatin (PRAVACHOL) 20 MG tablet TAKE 1 TABLET BY MOUTH EVERY DAY 90 tablet 1   Probiotic Product (ALIGN) 4 MG CAPS Take 1 capsule (4 mg total) by mouth daily.     sitaGLIPtin (JANUVIA) 100 MG tablet Take 1 tablet by mouth once daily. 30 tablet 2   traZODone (DESYREL) 150 MG tablet Take 0.5 tablets (75 mg total) by mouth at bedtime.     vitamin E 1000 UNIT capsule Take 1,000 Units by mouth daily.     HYDROcodone-acetaminophen (NORCO/VICODIN) 5-325 MG tablet Take 0.5 tablets by mouth 2 (two) times  daily as needed for moderate pain or severe pain. 30 tablet 0   nystatin ointment (MYCOSTATIN) Apply 1 application topically 2 (two) times daily. 30 g 0   No facility-administered medications prior to visit.     Per HPI unless specifically indicated in ROS section below Review of Systems Objective:  BP 130/66 (BP Location: Right Arm, Patient Position: Sitting, Cuff Size: Normal)    Pulse (!) 101    Temp 97.6 F (36.4 C) (Temporal)    Ht 4\' 11"  (1.499 m)    Wt 139 lb 6 oz (63.2 kg)    SpO2 94%    BMI 28.15 kg/m   Wt Readings from Last 3 Encounters:  11/05/19 139 lb 6 oz (63.2 kg)  10/22/19 139 lb 9.6 oz (63.3 kg)  10/01/19 140 lb 3 oz (63.6 kg)      Physical Exam Vitals and nursing note reviewed.  Constitutional:      Appearance: Normal appearance. She is not ill-appearing.  Cardiovascular:     Rate and Rhythm: Normal rate and regular rhythm.     Pulses: Normal pulses.     Heart sounds: Normal heart sounds. No murmur heard.   Pulmonary:     Effort: Pulmonary effort is normal. No respiratory distress.     Breath sounds: Normal breath sounds. No wheezing, rhonchi or rales.  Musculoskeletal:     Right lower leg: Edema (tr) present.     Left lower leg: Edema (tr) present.     Comments: Compression stockings in place  Neurological:     Mental Status: She is alert.  Psychiatric:        Mood and Affect: Mood normal.        Behavior: Behavior normal.       Results for orders placed or performed in visit on 62/37/62  Basic Metabolic Panel  Result Value Ref Range   Sodium 139 135 - 145 mEq/L   Potassium 3.8 3.5 - 5.1 mEq/L   Chloride 99 96 - 112 mEq/L   CO2 32 19 - 32 mEq/L   Glucose, Bld 198 (H) 70 - 99 mg/dL   BUN 27 (H) 6 - 23 mg/dL   Creatinine, Ser 1.10 0.40 - 1.20 mg/dL   GFR 46.63 (L) >60.00 mL/min   Calcium 9.5 8.4 - 10.5 mg/dL   *Note: Due to a large number of results and/or encounters for the requested time period, some results have not been displayed. A  complete set of results can be found in Results Review.   Assessment & Plan:  This visit occurred during the SARS-CoV-2 public health emergency.  Safety protocols were in place, including screening questions prior to the visit, additional usage of staff PPE, and extensive cleaning of exam room while observing appropriate contact time as indicated for disinfecting solutions.   Problem List Items Addressed This Visit    Polypharmacy   Paroxysmal atrial fibrillation ([place])    Continue eliquis.      Encounter for chronic pain management - Primary    [place] CSRS reviewed Tolerating hydrocodone well. Continue hydrocodone 1/2 tab bid (decrease from prior qid dosing).       Chronic venous insufficiency    Legs look stable today - tr pedal edema but no erythema or warmth or sighs of cellulitis. Continue regular compression stocking use.       Chronic lower back pain    Hydrocodone refilled.       Relevant Medications   HYDROcodone-acetaminophen (NORCO/VICODIN) 5-325 MG tablet   Bilateral hearing loss    New L hearing aide with improvement in hearing. Remains hard of hearing however.       (HFpEF) heart failure with preserved ejection fraction (HCC)    Reviewed lasix dosing - continue 40mg  BID.           Meds ordered this encounter  Medications   HYDROcodone-acetaminophen (NORCO/VICODIN) 5-325 MG tablet    Sig: Take 0.5 tablets by mouth 2 (two) times daily as needed for moderate pain or severe pain.    Dispense:  30 tablet    Refill:  0   nystatin ointment (MYCOSTATIN)    Sig: Apply 1 application topically 2 (two) times daily.    Dispense:  30 g    Refill:  3   No orders of the defined types were placed in this encounter.   Patient Instructions  You are doing well today  Continue current medicines I've refilled hydrocodone.  Return as needed or in 3 months for wellness visit   Follow up plan: Return in about 3 months (around 02/05/2020) for medicare wellness visit, follow  up visit.  Ria Bush, MD

## 2019-11-05 NOTE — Assessment & Plan Note (Signed)
Reviewed lasix dosing - continue 40mg  BID.

## 2019-11-12 ENCOUNTER — Other Ambulatory Visit: Payer: Self-pay | Admitting: Family Medicine

## 2019-12-12 ENCOUNTER — Other Ambulatory Visit: Payer: Self-pay

## 2019-12-12 NOTE — Telephone Encounter (Signed)
Name of Medication:hydrocodone apap 5-325 mg  Name of Pharmacy: [facility] or Written Date and Quantity: # 30 on 11/05/19 Last Office Visit and Type: 11/05/19 FU Next Office Visit and Type: 02/11/20 CPX Last Controlled Substance Agreement Date: 10/16/2017 Last UDS:10/11/2017  In v/m pt had one pill left.

## 2019-12-13 MED ORDER — HYDROCODONE-ACETAMINOPHEN 5-325 MG PO TABS: 0.5000 | ORAL_TABLET | Freq: Two times a day (BID) | ORAL | 0 refills | Status: DC | PRN

## 2019-12-13 NOTE — Telephone Encounter (Signed)
ERx 

## 2019-12-24 ENCOUNTER — Other Ambulatory Visit: Payer: Self-pay | Admitting: Family Medicine

## 2019-12-24 ENCOUNTER — Other Ambulatory Visit: Payer: Self-pay

## 2019-12-24 ENCOUNTER — Encounter: Payer: Self-pay | Admitting: Endocrinology

## 2019-12-24 ENCOUNTER — Ambulatory Visit (INDEPENDENT_AMBULATORY_CARE_PROVIDER_SITE_OTHER): Payer: Medicare Other | Admitting: Endocrinology

## 2019-12-24 VITALS — BP 130/62 | HR 106 | Temp 98.2°F | Ht 59.0 in | Wt 139.2 lb

## 2019-12-24 DIAGNOSIS — I10 Essential (primary) hypertension: Secondary | ICD-10-CM | POA: Diagnosis not present

## 2019-12-24 DIAGNOSIS — R350 Frequency of micturition: Secondary | ICD-10-CM | POA: Diagnosis not present

## 2019-12-24 DIAGNOSIS — E1165 Type 2 diabetes mellitus with hyperglycemia: Secondary | ICD-10-CM | POA: Diagnosis not present

## 2019-12-24 DIAGNOSIS — E7800 Pure hypercholesterolemia, unspecified: Secondary | ICD-10-CM | POA: Diagnosis not present

## 2019-12-24 DIAGNOSIS — I25119 Atherosclerotic heart disease of native coronary artery with unspecified angina pectoris: Secondary | ICD-10-CM | POA: Diagnosis not present

## 2019-12-24 DIAGNOSIS — N3090 Cystitis, unspecified without hematuria: Secondary | ICD-10-CM

## 2019-12-24 LAB — URINALYSIS, ROUTINE W REFLEX MICROSCOPIC
Bilirubin Urine: NEGATIVE
Ketones, ur: NEGATIVE
Nitrite: NEGATIVE
Specific Gravity, Urine: 1.015 (ref 1.000–1.030)
Total Protein, Urine: NEGATIVE
Urine Glucose: NEGATIVE
Urobilinogen, UA: 0.2 (ref 0.0–1.0)
pH: 5.5 (ref 5.0–8.0)

## 2019-12-24 LAB — LIPID PANEL
Cholesterol: 131 mg/dL (ref 0–200)
HDL: 50.5 mg/dL (ref >39.00)
LDL Cholesterol: 67 mg/dL (ref 0–99)
NonHDL: 80.76
Total CHOL/HDL Ratio: 3
Triglycerides: 71 mg/dL (ref 0.0–149.0)
VLDL: 14.2 mg/dL (ref 0.0–40.0)

## 2019-12-24 LAB — POCT GLYCOSYLATED HEMOGLOBIN (HGB A1C): Hemoglobin A1C: 7.1 % — AB (ref 4.0–5.6)

## 2019-12-24 LAB — MICROALBUMIN / CREATININE URINE RATIO
Creatinine,U: 52.7 mg/dL
Microalb Creat Ratio: 9.4 mg/g (ref 0.0–30.0)
Microalb, Ur: 5 mg/dL — ABNORMAL HIGH (ref 0.0–1.9)

## 2019-12-24 LAB — COMPREHENSIVE METABOLIC PANEL
ALT: 11 U/L (ref 0–35)
AST: 17 U/L (ref 0–37)
Albumin: 3.8 g/dL (ref 3.5–5.2)
Alkaline Phosphatase: 126 U/L — ABNORMAL HIGH (ref 39–117)
BUN: 30 mg/dL — ABNORMAL HIGH (ref 6–23)
CO2: 33 mEq/L — ABNORMAL HIGH (ref 19–32)
Calcium: 9.5 mg/dL (ref 8.4–10.5)
Chloride: 97 mEq/L (ref 96–112)
Creatinine, Ser: 1.23 mg/dL — ABNORMAL HIGH (ref 0.40–1.20)
GFR: 40.97 mL/min — ABNORMAL LOW (ref >60.00)
Glucose, Bld: 108 mg/dL — ABNORMAL HIGH (ref 70–99)
Potassium: 4.2 mEq/L (ref 3.5–5.1)
Sodium: 136 mEq/L (ref 135–145)
Total Bilirubin: 0.5 mg/dL (ref 0.2–1.2)
Total Protein: 7.2 g/dL (ref 6.0–8.3)

## 2019-12-24 MED ORDER — CEPHALEXIN 500 MG PO CAPS: 500.0000 mg | ORAL_CAPSULE | Freq: Two times a day (BID) | ORAL | 0 refills | Status: DC

## 2019-12-24 NOTE — Progress Notes (Signed)
[name], female   DOB: 12-Mar-1928, 86 y.o.   MRN: [id number]   Reason for Appointment: Diabetes follow-up   History of Present Illness   Diagnosis: Type 2 DIABETES MELITUS, date of diagnosis: 1991   She has had mild diabetes for several years and has been on Actos only since about 2001 Her A1c has generally been around or under 7% She was given a trial of metformin because of her difficulty with weight loss and also edema but she felt that it made her blood sugars go up and she will not try it again Also she thinks her blood sugars were higher with a trial of Januvia  RECENT history:  A1c is slightly better at 7.1 compared to 7.4   Oral hypoglycemic drugs: Actos 15 mg daily, Januvia 100 mg  Current blood sugar values, day-to-day management and problems identified:  Difficult to communicate with the patient because of her hearing difficulty   She finally did bring her meter for download  Because of edema she was told to stop her Actos on the last visit but she appears to be still continuing this  Also stays on Januvia as directed  Blood sugars are generally fairly good with only occasional high readings, checking mostly in the afternoon Has only one relatively high reading of 200 and 1 in the evening around 6:30 PM         Side effects from medications: None  Monitors blood glucose: ?  Frequency .    Glucometer: One Touch.           Blood Glucose readings at home from meter download    AVERAGE 136, blood sugar range 101-201  Mid afternoon blood sugars 108-144 Late afternoon 101-201 Late evening 163-176      Meals: 2  meals per day usually  Physical activity:  minimal            Dietician visit: Most recent: Several years ago             Wt Readings from Last 3 Encounters:  12/24/19 139 lb 3.2 oz (63.1 kg)  11/05/19 139 lb 6 oz (63.2 kg)  10/22/19 139 lb 9.6 oz (63.3 kg)   Lab Results  Component Value Date   HGBA1C 7.1 (A)  12/24/2019   HGBA1C 7.4 (H) 09/02/2019   HGBA1C 7.5 (A) 06/11/2019   Lab Results  Component Value Date   MICROALBUR 5.0 (H) 12/24/2019   LDLCALC 67 12/24/2019   CREATININE 1.23 (H) 12/24/2019     Allergies as of 12/24/2019      Reactions   Metformin And Related Diarrhea      Medication List       Accurate as of December 24, 2019  9:00 PM. If you have any questions, ask your nurse or doctor.        acetaminophen 500 MG tablet Commonly known as: TYLENOL Take 500 mg by mouth every 6 (six) hours as needed for mild pain.   Align 4 MG Caps Take 1 capsule (4 mg total) by mouth daily.   ALPRAZolam 1 MG tablet Commonly known as: XANAX Take 0.5 tablets (0.5 mg total) by mouth 4 (four) times daily as needed for anxiety.   apixaban 5 MG Tabs tablet Commonly known as: Eliquis Take 1 tablet (5 mg total) by mouth 2 (two) times daily.   B COMPLEX-VITAMIN C PO Take 1 tablet by mouth daily. +zinc   cephALEXin 500 MG capsule Commonly known  as: KEFLEX Take 1 capsule (500 mg total) by mouth 2 (two) times daily. Started by: Ria Bush, MD   diclofenac sodium 1 % Gel Commonly known as: VOLTAREN Apply 1 application topically 3 (three) times daily.   diltiazem 120 MG 24 hr capsule Commonly known as: DILACOR XR Take 1 capsule (120 mg total) by mouth daily.   famotidine 20 MG tablet Commonly known as: Pepcid Take 1 tablet (20 mg total) by mouth daily as needed for heartburn (breakthrough heartburn).   fluticasone 50 MCG/ACT nasal spray Commonly known as: FLONASE Place into both nostrils.   furosemide 40 MG tablet Commonly known as: LASIX Take 1 tablet (40 mg total) by mouth 2 (two) times daily.   glucosamine-chondroitin 500-400 MG tablet Take 1 tablet by mouth daily.   HYDROcodone-acetaminophen 5-325 MG tablet Commonly known as: NORCO/VICODIN Take 0.5 tablets by mouth 2 (two) times daily as needed for moderate pain or severe pain.   ICaps Caps Take 1 capsule by  mouth daily.   loperamide 2 MG capsule Commonly known as: IMODIUM Take 2 mg by mouth 4 (four) times daily as needed for diarrhea or loose stools.   loratadine 10 MG tablet Commonly known as: CLARITIN Take 10 mg by mouth daily as needed for allergies.   metoprolol tartrate 50 MG tablet Commonly known as: LOPRESSOR TAKE 1 TABLET BY MOUTH TWICE A DAY   nitroGLYCERIN 0.4 MG SL tablet Commonly known as: NITROSTAT Place 1 tablet (0.4 mg total) under the tongue every 5 (five) minutes as needed for chest pain.   nystatin ointment Commonly known as: MYCOSTATIN Apply 1 application topically 2 (two) times daily.   nystatin-triamcinolone ointment Commonly known as: MYCOLOG Apply to affected area twice a day as needed for itching   OneTouch Verio test strip Generic drug: glucose blood USE TO TEST BLOOD SUGAR ONCE DAILY. DX:E11.9   OXcarbazepine 150 MG tablet Commonly known as: TRILEPTAL Take 150 mg by mouth daily. Every evening   pantoprazole 40 MG tablet Commonly known as: PROTONIX TAKE 1 TABLET BY MOUTH TWICE A DAY   pioglitazone 15 MG tablet Commonly known as: ACTOS Take 15 mg by mouth daily.   polyethylene glycol powder 17 GM/SCOOP powder Commonly known as: GLYCOLAX/MIRALAX Take 17 g by mouth daily. Hold for diarrhea   potassium chloride 10 MEQ CR capsule Commonly known as: MICRO-K Take 1 capsule (10 mEq total) by mouth daily.   pravastatin 20 MG tablet Commonly known as: PRAVACHOL TAKE 1 TABLET BY MOUTH EVERY DAY   sitaGLIPtin 100 MG tablet Commonly known as: Januvia Take 1 tablet by mouth once daily.   traZODone 150 MG tablet Commonly known as: DESYREL Take 0.5 tablets (75 mg total) by mouth at bedtime.   vitamin E 1000 UNIT capsule Take 1,000 Units by mouth daily.       Allergies:  Allergies  Allergen Reactions  . Metformin And Related Diarrhea    Past Medical History:  Diagnosis Date  . Acute deep vein thrombosis (DVT) of distal vein of right  lower extremity ([place]) 12/2013   Acute thrombus of R proximal gastrocnemius vein. Symptomatic provoked distal DVT  . Anxiety   . Basal cell carcinoma 10/30/2018   R nasal bridge, treated with Moh's  . Cellulitis of leg, left 01/16/2014  . CHF (congestive heart failure) ([place])   . Chronic back pain 03/2012   lumbar DDD with herniation - s/p L S1 transforaminal ESI (10/2012 Dr. Sharlet Salina)  . Chronic venous insufficiency   . Coronary  artery disease    CABG 1998  . Cystocele   . Depression   . Diabetes mellitus    type 2, followed by endo.  . Diastolic dysfunction   . Diverticulosis   . Hemorrhoids   . History of heart attack   . Hx of echocardiogram    a. Echo 10/13: mild LVH, EF 55-60%, Gr 1 diast dysfn, PASP 32  . Hyperlipidemia   . Hypertension   . Irritable bowel syndrome   . Microcytic anemia   . PAD (peripheral artery disease) ([place]) 2013   ABI: R 0.91, L 0.83  . Postmenopausal osteoporosis 01/2019   T score of -2.6  . PUD (peptic ulcer disease)   . Varicose veins   . Venous ulcer of leg ([place]) 12/12/2013   Required prolonged treatment by wound center  . Viral gastroenteritis due to [place] virus 05/13/2015   hospitalization    Past Surgical History:  Procedure Laterality Date  . ABDOMINAL HYSTERECTOMY  1975   TAH,BSO  . ABI  2013   R 0.91, L 0.83  . APPENDECTOMY     per pt report  . CARDIOVASCULAR STRESS TEST  2015   low risk myoview ([place])  . CATARACT SURGERY    . CHOLECYSTECTOMY    . COLONOSCOPY  02/2010   extensive diverticulosis throughout, small internal hemorrhoids, rec rpt 5 yrs (Dr. Allyn Kenner)  . CORONARY ARTERY BYPASS GRAFT  1998  . dexa scan  09/2011   femur -0.8, forearm 0.6 => normal  . ENDOVENOUS ABLATION SAPHENOUS VEIN W/ LASER Left 03/2014   Kellie Simmering  . ESI  2014   L S1 transforaminal ESI x2 ([place]) without improvement  . HAND SURGERY    . lexiscan myoview  03/2011   Small basal inferolateral and anterolateral reversible perfusion defect suggests  ischemia.  EF was normal.   old infarct, no new ischemia  . LOWER EXTREMITY ANGIOGRAPHY Right 09/06/2018   Procedure: LOWER EXTREMITY ANGIOGRAPHY;  Surgeon: Algernon Huxley, MD;  Location: [facility];  Service: Cardiovascular;  Laterality: Right;  . toe surgery    . TYMPANOPLASTY  1960s   R side  . Vault prolapse A & P repair  2009   Dr Rhodia Albright [facility]    Family History  Problem Relation Age of Onset  . Hypertension Mother   . Stroke Mother   . Diabetes Father   . Diabetes Sister   . Diabetes Brother   . Heart disease Son   . Colon cancer Neg Hx     Social History:  reports that she quit smoking about 40 years ago. She has never used smokeless tobacco. She reports that she does not drink alcohol and does not use drugs.  Review of Systems:  She has history of leg edema especially on the right side She thinks that she was having some oozing of fluid from the right leg yesterday but none today Has been prescribed Lasix 40 mg twice daily  HYPERTENSION:  She is now on metoprolol and diltiazem   BP Readings from Last 3 Encounters:  12/24/19 130/62  11/05/19 130/66  10/22/19 96/62    HYPERLIPIDEMIA: High LDL has been treated with pravastatin 20 mg  This is followed by cardiologist and PCP Last LDL below 70  Lab Results  Component Value Date   CHOL 131 12/24/2019   HDL 50.50 12/24/2019   LDLCALC 67 12/24/2019   TRIG 71.0 12/24/2019   CHOLHDL 3 12/24/2019    Last foot exam in  6/21      Examination:   BP 130/62 (BP Location: Left Arm, Patient Position: Sitting, Cuff Size: Normal)   Pulse (!) 106   Temp 98.2 F (36.8 C) (Oral)   Ht 4\' 11"  (1.499 m)   Wt 139 lb 3.2 oz (63.1 kg)   SpO2 93%   BMI 28.11 kg/m   Body mass index is 28.11 kg/m.   No obvious leg edema present   ASSESSMENT/ PLAN:   DIABETES type 2 with obesity  A1c is 7.1  She is continuing on Actos, 15 mg and Januvia 100 mg  Considering her age and long duration of diabetes her  blood sugar control is excellent Currently tolerating 15 mg of Actos with no recent history of heart failure, edema or unusual weight gain She has checked blood sugars about once every other day lately and home readings are fairly good  We will recommend continuing the same regimen Discussed trying to check blood sugars at different times the day and to call if they are consistently over 200  She is asking about some urinary urgency and may have some discomfort, asking for urinalysis  Also needs follow-up of lipids, renal function and microalbumin  Hypertension: Well-controlled   Follow-up in 3 months    There are no Patient Instructions on file for this visit.    Elayne Snare 12/24/2019, 9:00 PM      Note: This office note was prepared with Dragon voice recognition system technology. Any transcriptional errors that result from this process are unintentional.   Addendum: Urinalysis abnormal, will forward to PCP for action

## 2019-12-25 ENCOUNTER — Ambulatory Visit (INDEPENDENT_AMBULATORY_CARE_PROVIDER_SITE_OTHER): Payer: Medicare Other | Admitting: Family Medicine

## 2019-12-25 ENCOUNTER — Telehealth: Payer: Self-pay

## 2019-12-25 ENCOUNTER — Encounter: Payer: Self-pay | Admitting: Family Medicine

## 2019-12-25 VITALS — BP 124/64 | HR 78 | Temp 98.2°F | Ht 59.0 in | Wt 139.5 lb

## 2019-12-25 DIAGNOSIS — I8311 Varicose veins of right lower extremity with inflammation: Secondary | ICD-10-CM | POA: Diagnosis not present

## 2019-12-25 DIAGNOSIS — N3090 Cystitis, unspecified without hematuria: Secondary | ICD-10-CM | POA: Diagnosis not present

## 2019-12-25 DIAGNOSIS — Z23 Encounter for immunization: Secondary | ICD-10-CM

## 2019-12-25 DIAGNOSIS — R350 Frequency of micturition: Secondary | ICD-10-CM

## 2019-12-25 DIAGNOSIS — I25119 Atherosclerotic heart disease of native coronary artery with unspecified angina pectoris: Secondary | ICD-10-CM

## 2019-12-25 NOTE — Telephone Encounter (Addendum)
Attempted to contact pt.  No answer.  Vm box not set up.  Need to get urinary sxs and relay Dr. Synthia Innocent message.   Msg: I received notice that patient is experiencing frequency and urinary discomfort.  Notify pt I've sent in Keflex antibiotic.  I want her to start taking for presumed UTI given UA findings. Let us know if symptoms persist after finishing antibiotic course.  (see Labs, 12/24/19)

## 2019-12-25 NOTE — Telephone Encounter (Signed)
[facility] - Client Nonclinical Telephone Record [facility] Client [facility] - Client Client Site [place] Physician Ria Bush - MD Contact Type Call Who Is Calling Patient / Member / Family / Caregiver Caller Name [place] Phone Number [phone] Patient Name [name] Patient DOB [birth year] Call Type Message Only Information Provided Reason for Call Request to Schedule Office Appointment Initial Comment Caller has water coming out of legs. She has urinary issues. Disp. Time Disposition Final User 12/25/2019 9:11:11 AM General Information Provided Yes Marshell Garfinkel Call Closed By: Marshell Garfinkel Transaction Date/Time: 12/25/2019 9:05:06 AM (ET)

## 2019-12-25 NOTE — Progress Notes (Signed)
This visit was conducted in person.  BP 124/64 (BP Location: Left Arm, Patient Position: Sitting, Cuff Size: Normal)   Pulse 78   Temp 98.2 F (36.8 C) (Temporal)   Ht 4\' 11"  (1.499 m)   Wt 139 lb 8 oz (63.3 kg)   SpO2 97%   BMI 28.18 kg/m    CC: check legs Subjective:    [name], female    DOB: October 24, 1927, 86 y.o.   MRN: [id number]  HPI: [name] is a 86 y.o. female presenting on 12/25/2019 for Skin Problem (C/o drainage on right leg and pain in bilateral leg pain. Pt accompanied by friend, [name] 98.1.)   4th son passed away last month 27-Nov-2019 - liver cancer with mets.   3d h/o R weeping leg of clear fluid.  No increased pain in leges.  No redness or fever to legs.   Saw endo Dr Dwyane Dee yesterday, abnormal UA in setting of increased urinary frequency and discomfort - UCx pending, will start keflex in interim (see result note).       Relevant past medical, surgical, family and social history reviewed and updated as indicated. Interim medical history since our last visit reviewed. Allergies and medications reviewed and updated. Outpatient Medications Prior to Visit  Medication Sig Dispense Refill  . acetaminophen (TYLENOL) 500 MG tablet Take 500 mg by mouth every 6 (six) hours as needed for mild pain.    Marland Kitchen ALPRAZolam (XANAX) 1 MG tablet Take 0.5 tablets (0.5 mg total) by mouth 4 (four) times daily as needed for anxiety.    Marland Kitchen apixaban (ELIQUIS) 5 MG TABS tablet Take 1 tablet (5 mg total) by mouth 2 (two) times daily. 60 tablet 11  . B Complex-C (B COMPLEX-VITAMIN C PO) Take 1 tablet by mouth daily. +zinc     . diclofenac sodium (VOLTAREN) 1 % GEL Apply 1 application topically 3 (three) times daily. 1 Tube 1  . diltiazem (DILACOR XR) 120 MG 24 hr capsule Take 1 capsule (120 mg total) by mouth daily. 30 capsule 6  . famotidine (PEPCID) 20 MG tablet Take 1 tablet (20 mg total) by mouth daily as needed for heartburn (breakthrough heartburn).    .  fluticasone (FLONASE) 50 MCG/ACT nasal spray Place into both nostrils.    . furosemide (LASIX) 40 MG tablet Take 1 tablet (40 mg total) by mouth 2 (two) times daily. 180 tablet 1  . glucosamine-chondroitin 500-400 MG tablet Take 1 tablet by mouth daily.     Marland Kitchen glucose blood (ONETOUCH VERIO) test strip USE TO TEST BLOOD SUGAR ONCE DAILY. DX:E11.9 100 each 6  . HYDROcodone-acetaminophen (NORCO/VICODIN) 5-325 MG tablet Take 0.5 tablets by mouth 2 (two) times daily as needed for moderate pain or severe pain. 30 tablet 0  . loperamide (IMODIUM) 2 MG capsule Take 2 mg by mouth 4 (four) times daily as needed for diarrhea or loose stools.    Marland Kitchen loratadine (CLARITIN) 10 MG tablet Take 10 mg by mouth daily as needed for allergies.    . metoprolol tartrate (LOPRESSOR) 50 MG tablet TAKE 1 TABLET BY MOUTH TWICE A DAY 180 tablet 1  . Multiple Vitamins-Minerals (ICAPS) CAPS Take 1 capsule by mouth daily.    . nitroGLYCERIN (NITROSTAT) 0.4 MG SL tablet Place 1 tablet (0.4 mg total) under the tongue every 5 (five) minutes as needed for chest pain. 25 tablet 3  . nystatin ointment (MYCOSTATIN) Apply 1 application topically 2 (two) times daily. 30 g 3  .  nystatin-triamcinolone ointment (MYCOLOG) Apply to affected area twice a day as needed for itching    . OXcarbazepine (TRILEPTAL) 150 MG tablet Take 150 mg by mouth daily. Every evening    . pantoprazole (PROTONIX) 40 MG tablet TAKE 1 TABLET BY MOUTH TWICE A DAY 180 tablet 2  . pioglitazone (ACTOS) 15 MG tablet Take 15 mg by mouth daily.    . polyethylene glycol powder (GLYCOLAX/MIRALAX) powder Take 17 g by mouth daily. Hold for diarrhea 3350 g 1  . potassium chloride (MICRO-K) 10 MEQ CR capsule Take 1 capsule (10 mEq total) by mouth daily. 90 capsule 1  . pravastatin (PRAVACHOL) 20 MG tablet TAKE 1 TABLET BY MOUTH EVERY DAY 90 tablet 1  . Probiotic Product (ALIGN) 4 MG CAPS Take 1 capsule (4 mg total) by mouth daily.    . sitaGLIPtin (JANUVIA) 100 MG tablet Take 1  tablet by mouth once daily. (Patient taking differently: Take 1/2 tablet by mouth twice a daily.) 30 tablet 2  . traZODone (DESYREL) 150 MG tablet Take 0.5 tablets (75 mg total) by mouth at bedtime.    . vitamin E 1000 UNIT capsule Take 1,000 Units by mouth daily.    . cephALEXin (KEFLEX) 500 MG capsule Take 1 capsule (500 mg total) by mouth 2 (two) times daily. (Patient not taking: Reported on 12/25/2019) 14 capsule 0   No facility-administered medications prior to visit.     Per HPI unless specifically indicated in ROS section below Review of Systems Objective:  BP 124/64 (BP Location: Left Arm, Patient Position: Sitting, Cuff Size: Normal)   Pulse 78   Temp 98.2 F (36.8 C) (Temporal)   Ht 4\' 11"  (1.499 m)   Wt 139 lb 8 oz (63.3 kg)   SpO2 97%   BMI 28.18 kg/m   Wt Readings from Last 3 Encounters:  12/25/19 139 lb 8 oz (63.3 kg)  12/24/19 139 lb 3.2 oz (63.1 kg)  11/05/19 139 lb 6 oz (63.2 kg)      Physical Exam Vitals and nursing note reviewed.  Constitutional:      Appearance: Normal appearance. She is not ill-appearing.     Comments: Walks with walker  Musculoskeletal:     Right lower leg: Edema (tr) present.     Left lower leg: Edema (tr) present.     Comments: Compression stockings in place bilaterally  Skin:    General: Skin is warm and dry.     Findings: No erythema or rash.     Comments: RLE skin checked - no erythema of RLE, no source of leaking fluid found   Neurological:     Mental Status: She is alert.       Results for orders placed or performed in visit on 12/24/19  Lipid panel  Result Value Ref Range   Cholesterol 131 0 - 200 mg/dL   Triglycerides 71.0 0 - 149 mg/dL   HDL 50.50 >39.00 mg/dL   VLDL 14.2 0.0 - 40.0 mg/dL   LDL Cholesterol 67 0 - 99 mg/dL   Total CHOL/HDL Ratio 3    NonHDL 80.76   Urinalysis, Routine w reflex microscopic  Result Value Ref Range   Color, Urine YELLOW Yellow;Lt. Yellow;Straw;Dark Yellow;Amber;Green;Red;Brown    APPearance Sl Cloudy (A) Clear;Turbid;Slightly Cloudy;Cloudy   Specific Gravity, Urine 1.015 1.000 - 1.030   pH 5.5 5.0 - 8.0   Total Protein, Urine NEGATIVE Negative   Urine Glucose NEGATIVE Negative   Ketones, ur NEGATIVE Negative   Bilirubin Urine NEGATIVE  Negative   Hgb urine dipstick SMALL (A) Negative   Urobilinogen, UA 0.2 0.0 - 1.0   Leukocytes,Ua LARGE (A) Negative   Nitrite NEGATIVE Negative   WBC, UA TNTC(>50/hpf) (A) 0-2/hpf   RBC / HPF 0-2/hpf 0-2/hpf   Squamous Epithelial / LPF Rare(0-4/hpf) Rare(0-4/hpf)   Bacteria, UA Many(>50/hpf) (A) None  Microalbumin / creatinine urine ratio  Result Value Ref Range   Microalb, Ur 5.0 (H) 0.0 - 1.9 mg/dL   Creatinine,U 52.7 mg/dL   Microalb Creat Ratio 9.4 0.0 - 30.0 mg/g  Comprehensive metabolic panel  Result Value Ref Range   Sodium 136 135 - 145 mEq/L   Potassium 4.2 3.5 - 5.1 mEq/L   Chloride 97 96 - 112 mEq/L   CO2 33 (H) 19 - 32 mEq/L   Glucose, Bld 108 (H) 70 - 99 mg/dL   BUN 30 (H) 6 - 23 mg/dL   Creatinine, Ser 1.23 (H) 0.40 - 1.20 mg/dL   Total Bilirubin 0.5 0.2 - 1.2 mg/dL   Alkaline Phosphatase 126 (H) 39 - 117 U/L   AST 17 0 - 37 U/L   ALT 11 0 - 35 U/L   Total Protein 7.2 6.0 - 8.3 g/dL   Albumin 3.8 3.5 - 5.2 g/dL   GFR 40.97 (L) >60.00 mL/min   Calcium 9.5 8.4 - 10.5 mg/dL  POCT glycosylated hemoglobin (Hb A1C)  Result Value Ref Range   Hemoglobin A1C 7.1 (A) 4.0 - 5.6 %   HbA1c POC (<> result, manual entry)     HbA1c, POC (prediabetic range)     HbA1c, POC (controlled diabetic range)     *Note: Due to a large number of results and/or encounters for the requested time period, some results have not been displayed. A complete set of results can be found in Results Review.   Assessment & Plan:  This visit occurred during the SARS-CoV-2 public health emergency.  Safety protocols were in place, including screening questions prior to the visit, additional usage of staff PPE, and extensive cleaning of exam  room while observing appropriate contact time as indicated for disinfecting solutions.   Problem List Items Addressed This Visit    Varicose veins of right lower extremity with inflammation - Primary    Overall stable period however pt endorsed increasing weeping from RLE - however on exam today no source found. Will continue to encourage routine care for known CVI.       Urinary frequency    UA at endo abnormal along with UTI symptoms - to start Keflex while we await UCx results.        Other Visit Diagnoses    Need for influenza vaccination       Relevant Orders   Flu Vaccine QUAD High Dose(Fluad) (Completed)       No orders of the defined types were placed in this encounter.  Orders Placed This Encounter  Procedures  . Flu Vaccine QUAD High Dose(Fluad)    Follow up plan: Return if symptoms worsen or fail to improve.  Ria Bush, MD

## 2019-12-25 NOTE — Telephone Encounter (Signed)
Pt given Dr. Synthia Innocent message at [place] today.

## 2019-12-25 NOTE — Addendum Note (Signed)
Addended by: Kaylyn Lim I on: 12/25/2019 07:28 AM   Modules accepted: Orders

## 2019-12-25 NOTE — Patient Instructions (Addendum)
Flu shot today.  Leg looks ok today.  Take antibiotic for UTI. Culture is pending. Let us know if not getting better with treatment.

## 2019-12-25 NOTE — Assessment & Plan Note (Signed)
Overall stable period however pt endorsed increasing weeping from RLE - however on exam today no source found. Will continue to encourage routine care for known CVI.

## 2019-12-25 NOTE — Telephone Encounter (Signed)
Per appt notes pt already has appt to see Dr Darnell Level today in office at 12:30.

## 2019-12-25 NOTE — Assessment & Plan Note (Signed)
UA at endo abnormal along with UTI symptoms - to start Keflex while we await UCx results.

## 2019-12-28 LAB — URINE CULTURE

## 2020-01-01 ENCOUNTER — Other Ambulatory Visit: Payer: Self-pay | Admitting: Cardiovascular Disease

## 2020-01-01 ENCOUNTER — Other Ambulatory Visit: Payer: Self-pay | Admitting: Family Medicine

## 2020-01-01 DIAGNOSIS — I480 Paroxysmal atrial fibrillation: Secondary | ICD-10-CM

## 2020-01-01 NOTE — Telephone Encounter (Signed)
Prescription refill request for Eliquis received. Indication: PAF Last office visit: 10/22/19 Scr: 1.23 Age:   86 Weight:  63.3kg

## 2020-01-01 NOTE — Telephone Encounter (Signed)
Diltiazem XR Last filled:  12/10/19, #30 Last OV:  12/25/19, varicose veins inflammation Next OV:  02/11/20, AWV prt 2

## 2020-01-15 ENCOUNTER — Telehealth: Payer: Self-pay | Admitting: Family Medicine

## 2020-01-15 MED ORDER — HYDROCODONE-ACETAMINOPHEN 5-325 MG PO TABS: 0.5000 | ORAL_TABLET | Freq: Two times a day (BID) | ORAL | 0 refills | Status: DC | PRN

## 2020-01-15 NOTE — Telephone Encounter (Signed)
Medication Refill - Medication:  HYDROcodone-acetaminophen (NORCO/VICODIN) 5-325 MG tablet  Has the patient contacted their pharmacy?  Yes controlled substance.   Preferred Pharmacy (with phone number or street name):  [id number] - [place], [place] Phone:  [phone]  Fax:  [phone]

## 2020-01-15 NOTE — Telephone Encounter (Signed)
ERx 

## 2020-01-22 ENCOUNTER — Other Ambulatory Visit: Payer: Self-pay

## 2020-01-22 ENCOUNTER — Ambulatory Visit (INDEPENDENT_AMBULATORY_CARE_PROVIDER_SITE_OTHER): Payer: Medicare Other | Admitting: Dermatology

## 2020-01-22 DIAGNOSIS — L814 Other melanin hyperpigmentation: Secondary | ICD-10-CM | POA: Diagnosis not present

## 2020-01-22 DIAGNOSIS — L578 Other skin changes due to chronic exposure to nonionizing radiation: Secondary | ICD-10-CM

## 2020-01-22 DIAGNOSIS — Z85828 Personal history of other malignant neoplasm of skin: Secondary | ICD-10-CM

## 2020-01-22 DIAGNOSIS — L570 Actinic keratosis: Secondary | ICD-10-CM | POA: Diagnosis not present

## 2020-01-22 DIAGNOSIS — B079 Viral wart, unspecified: Secondary | ICD-10-CM | POA: Diagnosis not present

## 2020-01-22 DIAGNOSIS — C44619 Basal cell carcinoma of skin of left upper limb, including shoulder: Secondary | ICD-10-CM

## 2020-01-22 DIAGNOSIS — D485 Neoplasm of uncertain behavior of skin: Secondary | ICD-10-CM | POA: Diagnosis not present

## 2020-01-22 DIAGNOSIS — D489 Neoplasm of uncertain behavior, unspecified: Secondary | ICD-10-CM

## 2020-01-22 DIAGNOSIS — L821 Other seborrheic keratosis: Secondary | ICD-10-CM

## 2020-01-22 HISTORY — DX: Actinic keratosis: L57.0

## 2020-01-22 NOTE — Progress Notes (Signed)
Follow-Up Visit   Subjective  [name] is a 86 y.o. female who presents for the following: Area of concern (on Right scalp and on left cheek.).  Patient presents today for a few areas of concern today on her right scalp and left cheek. Patient has history of AK's treated with LN2 in Oct. 2020 R. Zygoma, R nose supratip, and 07/17/19 Mid upper lip [place] border. Patient also has history of BCC on R. Medial canthus.  She defers FBSE  The following portions of the chart were reviewed this encounter and updated as appropriate:  Tobacco  Allergies  Meds  Problems  Med Hx  Surg Hx  Fam Hx      Review of Systems:  No other skin or systemic complaints except as noted in HPI or Assessment and Plan.  Objective  Well appearing patient in no apparent distress; mood and affect are within normal limits.  A focused examination was performed including scalp, face, ears, eyelids, lips, neck, chest, bilateral arms, hands, fingernails. Relevant physical exam findings are noted in the Assessment and Plan.  Objective  Left Buccal Cheek , Mid Frontal Scalp: Stuck-on, waxy, tan-brown papules and plaques  Objective  Right Forearm: 0.7 cm thick scaly papule       Objective  Right Elbow: 0.6 thick scaly papule       Objective  Left Forearm - Anterior: 0.4 pink papule     Objective  Left Forearm - Anterior, Right Upper Cutaneous Lip: Erythematous thin papules/macules with gritty scale.    Assessment & Plan  Seborrheic keratosis (2) Mid Frontal Scalp; Left Buccal Cheek   Benign-appearing.  Observation.  Call clinic for new or changing lesions.    Neoplasm of uncertain behavior (3) Right Forearm  Skin / nail biopsy Type of biopsy: tangential   Informed consent: discussed and consent obtained   Timeout: patient name, date of birth, surgical site, and procedure verified   Procedure prep:  Patient was prepped and draped in usual sterile fashion Prep type:   Isopropyl alcohol Anesthesia: the lesion was anesthetized in a standard fashion   Anesthetic:  1% lidocaine w/ epinephrine 1-100,000 buffered w/ 8.4% NaHCO3 Hemostasis achieved with: suture and aluminum chloride   Outcome: patient tolerated procedure well   Post-procedure details: sterile dressing applied and wound care instructions given   Dressing type: petrolatum and pressure dressing    Specimen 1 - Surgical pathology Differential Diagnosis: R/O SCC Check Margins: No 0.7 cm thick scaly papule  Right Elbow  Skin / nail biopsy Type of biopsy: tangential   Informed consent: discussed and consent obtained   Timeout: patient name, date of birth, surgical site, and procedure verified   Procedure prep:  Patient was prepped and draped in usual sterile fashion Prep type:  Isopropyl alcohol Anesthesia: the lesion was anesthetized in a standard fashion   Anesthetic:  1% lidocaine w/ epinephrine 1-100,000 buffered w/ 8.4% NaHCO3 Instrument used: flexible razor blade   Hemostasis achieved with: aluminum chloride   Outcome: patient tolerated procedure well   Post-procedure details: sterile dressing applied and wound care instructions given   Dressing type: bandage (mupirocin)    Specimen 2 - Surgical pathology Differential Diagnosis: R/O SCC Check Margins: No 0.6 thick scaly papule  Left Forearm - Anterior  Skin / nail biopsy Type of biopsy: tangential   Informed consent: discussed and consent obtained   Timeout: patient name, date of birth, surgical site, and procedure verified   Procedure prep:  Patient was prepped  and draped in usual sterile fashion Prep type:  Isopropyl alcohol Anesthesia: the lesion was anesthetized in a standard fashion   Anesthetic:  1% lidocaine w/ epinephrine 1-100,000 buffered w/ 8.4% NaHCO3 Instrument used: flexible razor blade   Hemostasis achieved with: aluminum chloride   Outcome: patient tolerated procedure well   Post-procedure details: sterile  dressing applied and wound care instructions given   Dressing type: bandage (mupirocin)    Specimen 3 - Surgical pathology Differential Diagnosis: R/O BCC Check Margins: No 0.4 pink papule  AK (actinic keratosis) (2) Left Forearm - Anterior; Right Upper Cutaneous Lip  Cryotherapy today Prior to procedure, discussed risks of blister formation, small wound, skin dyspigmentation, or rare scar following cryotherapy.    Destruction of lesion - Left Forearm - Anterior, Right Upper Cutaneous Lip  Destruction method: cryotherapy   Informed consent: discussed and consent obtained   Lesion destroyed using liquid nitrogen: Yes   Outcome: patient tolerated procedure well with no complications   Post-procedure details: wound care instructions given    Actinic Damage - diffuse scaly erythematous macules with underlying dyspigmentation - Recommend daily broad spectrum sunscreen SPF 30+ to sun-exposed areas, reapply every 2 hours as needed.  - Call for new or changing lesions.  Lentigines - Scattered tan macules - Discussed due to sun exposure - Benign, observe - Call for any changes  Seborrheic Keratoses - Stuck-on, waxy, tan-brown papules and plaques  - Discussed benign etiology and prognosis. - Observe - Call for any changes  History of Basal Cell Carcinoma of the Skin - No evidence of recurrence today - Recommend regular full body skin exams - Recommend daily broad spectrum sunscreen SPF 30+ to sun-exposed areas, reapply every 2 hours as needed.  - Call if any new or changing lesions are noted between office visits  Return 4 to 6 weeks follow up.  IDonzetta Kohut, CMA, am acting as scribe for Forest Gleason, MD .  Documentation: I have reviewed the above documentation for accuracy and completeness, and I agree with the above.  Forest Gleason, MD

## 2020-01-22 NOTE — Patient Instructions (Signed)
Recommend daily broad spectrum sunscreen SPF 30+ to sun-exposed areas, reapply every 2 hours as needed. Call for new or changing lesions. Wound Care Instructions  1. Cleanse wound gently with soap and water once a day then pat dry with clean gauze. Apply a thing coat of Petrolatum (petroleum jelly, "Vaseline") over the wound (unless you have an allergy to this). We recommend that you use a new, sterile tube of Vaseline. Do not pick or remove scabs. Do not remove the yellow or white "healing tissue" from the base of the wound.  2. Cover the wound with fresh, clean, nonstick gauze and secure with paper tape. You may use Band-Aids in place of gauze and tape if the would is small enough, but would recommend trimming much of the tape off as there is often too much. Sometimes Band-Aids can irritate the skin.  3. You should call the office for your biopsy report after 1 week if you have not already been contacted.  4. If you experience any problems, such as abnormal amounts of bleeding, swelling, significant bruising, significant pain, or evidence of infection, please call the office immediately.  5. FOR ADULT SURGERY PATIENTS: If you need something for pain relief you may take 1 extra strength Tylenol (acetaminophen) AND 2 Ibuprofen (200mg each) together every 4 hours as needed for pain. (do not take these if you are allergic to them or if you have a reason you should not take them.) Typically, you may only need pain medication for 1 to 3 days.     Prior to procedure, discussed risks of blister formation, small wound, skin dyspigmentation, or rare scar following cryotherapy.  Liquid nitrogen was applied for 10-12 seconds to the skin lesion and the expected blistering or scabbing reaction explained. Do not pick at the area. Patient reminded to expect hypopigmented scars from the procedure. Return if lesion fails to fully resolve.  Cryotherapy Aftercare  . Wash gently with soap and water everyday.    . Apply Vaseline and Band-Aid daily until healed.    

## 2020-01-28 NOTE — Progress Notes (Signed)
1. Skin , right forearm HYPERTROPHIC ACTINIC KERATOSIS WITH FEATURES OF A VERRUCA, BASE INVOLVED --> LN2 in clinic  2. 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 It is not dangerous but is contagious and can spread to other areas of skin or other people if it is not completely gone. No additional treatment is needed. However, if it bothersome, we can freeze it with liquid nitrogen.  3. Skin , left forearm-anterior BASAL CELL CARCINOMA, NODULAR PATTERN --> ED&C  MAs please call and review. Recommend follow-up in about 1 month and we can address all areas. If Ms. Otten has questions, let me know and I can call. Thanks!

## 2020-01-29 ENCOUNTER — Encounter: Payer: Self-pay | Admitting: Dermatology

## 2020-01-30 ENCOUNTER — Telehealth: Payer: Self-pay

## 2020-01-30 NOTE — Telephone Encounter (Signed)
-----   Message from Alfonso Patten, MD sent at 01/28/2020 11:59 AM EDT ----- 1. Skin , right forearm HYPERTROPHIC ACTINIC KERATOSIS WITH FEATURES OF A VERRUCA, BASE INVOLVED --> LN2 in clinic  2. 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 It is not dangerous but is contagious and can spread to other areas of skin or other people if it is not completely gone. No additional treatment is needed. However, if it bothersome, we can freeze it with liquid nitrogen.  3. Skin , left forearm-anterior BASAL CELL CARCINOMA, NODULAR PATTERN --> ED&C  MAs please call and review. Recommend follow-up in about 1 month and we can address all areas. If Ms. Vanduzer has questions, let me know and I can call. Thanks!

## 2020-02-04 ENCOUNTER — Other Ambulatory Visit: Payer: Medicare Other

## 2020-02-06 ENCOUNTER — Ambulatory Visit: Payer: Medicare Other

## 2020-02-09 IMAGING — DX DG TIBIA/FIBULA 2V*R*
3 series · 3 of 3 positions shown · non-contrast
Comparison: None.

CLINICAL DATA: Right lower extremity edema and pain

EXAM:
RIGHT TIBIA AND FIBULA - 2 VIEW

[tibia ap]
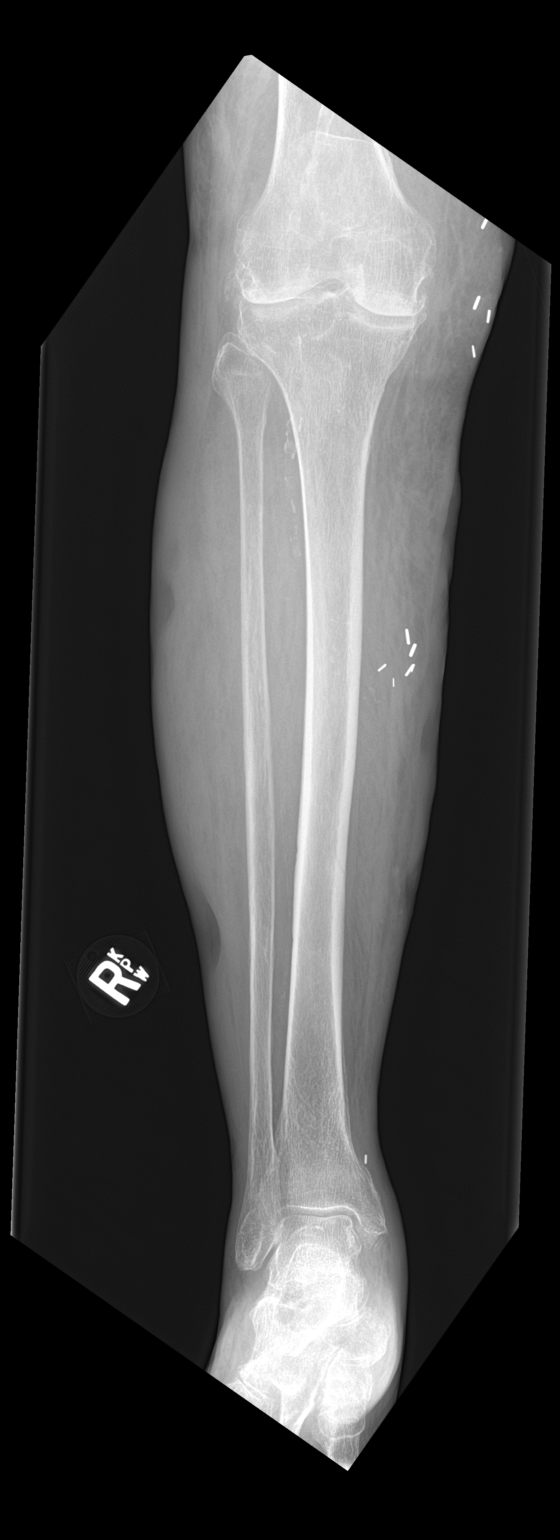

[tibia lat (1 of 2)]
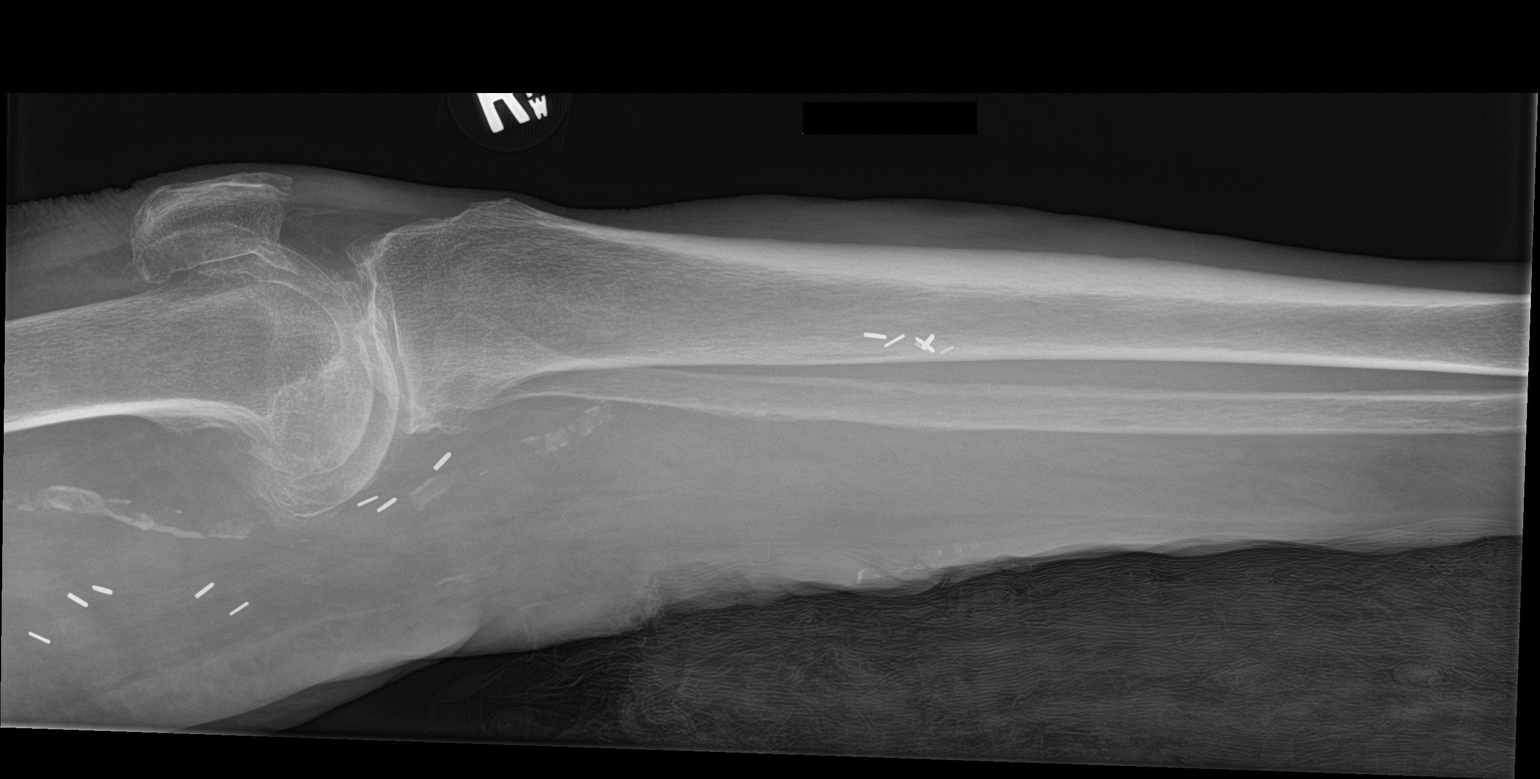

[tibia lat (2 of 2)]
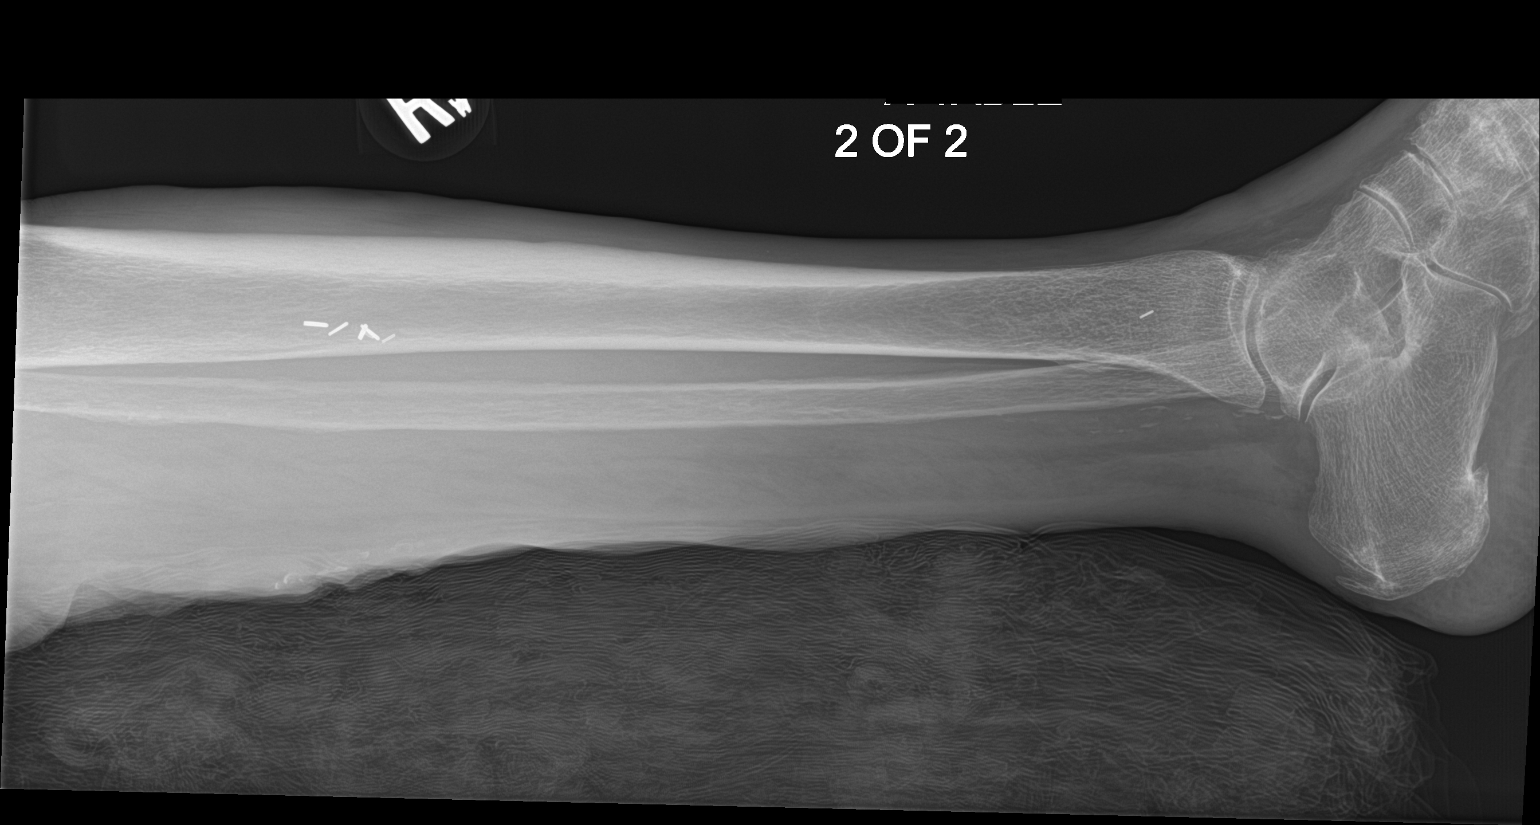

[3 of 3 positions shown; findings below may reference images not displayed]

FINDINGS: No fracture or malalignment. Small plantar calcaneal spur and
posterior enthesophyte. No periostitis or bone destruction. Indented
appearance of the soft tissues at the proximal and distal lateral
leg and the mid medial leg, presumably corresponding to the history
of wounds. No soft tissue emphysema. Clips in the soft tissues and
vascular calcification
IMPRESSION: 1. No acute osseous abnormality
2. Soft tissue indentation within the proximal mid and lower leg
presumably corresponding to the history of wounds

## 2020-02-11 ENCOUNTER — Encounter: Payer: Medicare Other | Admitting: Family Medicine

## 2020-02-12 ENCOUNTER — Other Ambulatory Visit (INDEPENDENT_AMBULATORY_CARE_PROVIDER_SITE_OTHER): Payer: Medicare Other

## 2020-02-12 ENCOUNTER — Ambulatory Visit (INDEPENDENT_AMBULATORY_CARE_PROVIDER_SITE_OTHER): Payer: Medicare Other

## 2020-02-12 ENCOUNTER — Telehealth: Payer: Self-pay | Admitting: Family Medicine

## 2020-02-12 DIAGNOSIS — R300 Dysuria: Secondary | ICD-10-CM | POA: Diagnosis not present

## 2020-02-12 DIAGNOSIS — Z0000 Encounter for general adult medical examination without abnormal findings: Secondary | ICD-10-CM

## 2020-02-12 LAB — POC URINALSYSI DIPSTICK (AUTOMATED)
Bilirubin, UA: NEGATIVE
Glucose, UA: NEGATIVE
Ketones, UA: NEGATIVE
Nitrite, UA: NEGATIVE
Protein, UA: POSITIVE — AB
Spec Grav, UA: 1.02 (ref 1.010–1.025)
Urobilinogen, UA: 0.2 E.U./dL
pH, UA: 6 (ref 5.0–8.0)

## 2020-02-12 MED ORDER — CEPHALEXIN 500 MG PO CAPS
500.0000 mg | ORAL_CAPSULE | Freq: Two times a day (BID) | ORAL | 0 refills | Status: DC
Start: 2020-02-12 — End: 2020-02-26

## 2020-02-12 NOTE — Telephone Encounter (Signed)
[name] front office mgr spoke with pt and pt scheduled appt with Dr Darnell Level on 02/19/20 at 2:30 because pt will only see Dr Darnell Level and appt has to be on a Wed afternoon. FYI to Dr Darnell Level.

## 2020-02-12 NOTE — Telephone Encounter (Signed)
That is too long to wait.  Please have her collect urine sample in next 1-2 days to send for UA and culture and we will call her with results and plan. I could do a phone visit sometime tomorrow if that works better for her.

## 2020-02-12 NOTE — Telephone Encounter (Addendum)
Spoke with pt relaying Dr. Synthia Innocent message.  Says she will get someone to drop off her sample.  And she wants to do a phn visit with Dr. Darnell Level tomorrow anytime after 11:00.  C/x 11/17 OV.   What time do you want to schedule her?

## 2020-02-12 NOTE — Patient Instructions (Signed)
[name] , Thank you for taking time to come for your Medicare Wellness Visit. I appreciate your ongoing commitment to your health goals. Please review the following plan we discussed and let me know if I can assist you in the future.   Screening recommendations/referrals: Colonoscopy: no longer required Mammogram: Up to date, completed 05/13/2019, due 05/2020 Bone Density: Up to date, completed 01/10/2019, due 01/2021 Recommended yearly ophthalmology/optometry visit for glaucoma screening and checkup Recommended yearly dental visit for hygiene and checkup  Vaccinations: Influenza vaccine: Up to date, completed 12/25/2019, due 11/2020 Pneumococcal vaccine: Completed series Tdap vaccine: Up to date, completed 10/19/2017, due 10/2027 Shingles vaccine: due, check with your insurance regarding coverage/cost if interested   Covid-19:Completed series  Advanced directives: Please bring a copy of your POA (Power of [place]) and/or Living Will to your next appointment.   Conditions/risks identified: Diabetes, hypertension  Next appointment: Follow up in one year for your annual wellness visit    Preventive Care 15 Years and Older, Female Preventive care refers to lifestyle choices and visits with your health care provider that can promote health and wellness. What does preventive care include?  A yearly physical exam. This is also called an annual well check.  Dental exams once or twice a year.  Routine eye exams. Ask your health care provider how often you should have your eyes checked.  Personal lifestyle choices, including:  Daily care of your teeth and gums.  Regular physical activity.  Eating a healthy diet.  Avoiding tobacco and drug use.  Limiting alcohol use.  Practicing safe sex.  Taking low-dose aspirin every day.  Taking vitamin and mineral supplements as recommended by your health care provider. What happens during an annual well check? The services and screenings  done by your health care provider during your annual well check will depend on your age, overall health, lifestyle risk factors, and family history of disease. Counseling  Your health care provider may ask you questions about your:  Alcohol use.  Tobacco use.  Drug use.  Emotional well-being.  Home and relationship well-being.  Sexual activity.  Eating habits.  History of falls.  Memory and ability to understand (cognition).  Work and work Statistician.  Reproductive health. Screening  You may have the following tests or measurements:  Height, weight, and BMI.  Blood pressure.  Lipid and cholesterol levels. These may be checked every 5 years, or more frequently if you are over 69 years old.  Skin check.  Lung cancer screening. You may have this screening every year starting at age 61 if you have a 30-pack-year history of smoking and currently smoke or have quit within the past 15 years.  Fecal occult blood test (FOBT) of the stool. You may have this test every year starting at age 93.  Flexible sigmoidoscopy or colonoscopy. You may have a sigmoidoscopy every 5 years or a colonoscopy every 10 years starting at age 84.  Hepatitis C blood test.  Hepatitis B blood test.  Sexually transmitted disease (STD) testing.  Diabetes screening. This is done by checking your blood sugar (glucose) after you have not eaten for a while (fasting). You may have this done every 1-3 years.  Bone density scan. This is done to screen for osteoporosis. You may have this done starting at age 33.  Mammogram. This may be done every 1-2 years. Talk to your health care provider about how often you should have regular mammograms. Talk with your health care provider about your test results,  treatment options, and if necessary, the need for more tests. Vaccines  Your health care provider may recommend certain vaccines, such as:  Influenza vaccine. This is recommended every year.  Tetanus,  diphtheria, and acellular pertussis (Tdap, Td) vaccine. You may need a Td booster every 10 years.  Zoster vaccine. You may need this after age 45.  Pneumococcal 13-valent conjugate (PCV13) vaccine. One dose is recommended after age 53.  Pneumococcal polysaccharide (PPSV23) vaccine. One dose is recommended after age 80. Talk to your health care provider about which screenings and vaccines you need and how often you need them. This information is not intended to replace advice given to you by your health care provider. Make sure you discuss any questions you have with your health care provider. Document Released: 04/17/2015 Document Revised: 12/09/2015 Document Reviewed: 01/20/2015 Elsevier Interactive Patient Education  2017 [employer] Prevention in the Home Falls can cause injuries. They can happen to people of all ages. There are many things you can do to make your home safe and to help prevent falls. What can I do on the outside of my home?  Regularly fix the edges of walkways and driveways and fix any cracks.  Remove anything that might make you trip as you walk through a door, such as a raised step or threshold.  Trim any bushes or trees on the path to your home.  Use bright outdoor lighting.  Clear any walking paths of anything that might make someone trip, such as rocks or tools.  Regularly check to see if handrails are loose or broken. Make sure that both sides of any steps have handrails.  Any raised decks and porches should have guardrails on the edges.  Have any leaves, snow, or ice cleared regularly.  Use sand or salt on walking paths during winter.  Clean up any spills in your garage right away. This includes oil or grease spills. What can I do in the bathroom?  Use night lights.  Install grab bars by the toilet and in the tub and shower. Do not use towel bars as grab bars.  Use non-skid mats or decals in the tub or shower.  If you need to sit down in  the shower, use a plastic, non-slip stool.  Keep the floor dry. Clean up any water that spills on the floor as soon as it happens.  Remove soap buildup in the tub or shower regularly.  Attach bath mats securely with double-sided non-slip rug tape.  Do not have throw rugs and other things on the floor that can make you trip. What can I do in the bedroom?  Use night lights.  Make sure that you have a light by your bed that is easy to reach.  Do not use any sheets or blankets that are too big for your bed. They should not hang down onto the floor.  Have a firm chair that has side arms. You can use this for support while you get dressed.  Do not have throw rugs and other things on the floor that can make you trip. What can I do in the kitchen?  Clean up any spills right away.  Avoid walking on wet floors.  Keep items that you use a lot in easy-to-reach places.  If you need to reach something above you, use a strong step stool that has a grab bar.  Keep electrical cords out of the way.  Do not use floor polish or wax that makes floors  slippery. If you must use wax, use non-skid floor wax.  Do not have throw rugs and other things on the floor that can make you trip. What can I do with my stairs?  Do not leave any items on the stairs.  Make sure that there are handrails on both sides of the stairs and use them. Fix handrails that are broken or loose. Make sure that handrails are as long as the stairways.  Check any carpeting to make sure that it is firmly attached to the stairs. Fix any carpet that is loose or worn.  Avoid having throw rugs at the top or bottom of the stairs. If you do have throw rugs, attach them to the floor with carpet tape.  Make sure that you have a light switch at the top of the stairs and the bottom of the stairs. If you do not have them, ask someone to add them for you. What else can I do to help prevent falls?  Wear shoes that:  Do not have high  heels.  Have rubber bottoms.  Are comfortable and fit you well.  Are closed at the toe. Do not wear sandals.  If you use a stepladder:  Make sure that it is fully opened. Do not climb a closed stepladder.  Make sure that both sides of the stepladder are locked into place.  Ask someone to hold it for you, if possible.  Clearly mark and make sure that you can see:  Any grab bars or handrails.  First and last steps.  Where the edge of each step is.  Use tools that help you move around (mobility aids) if they are needed. These include:  Canes.  Walkers.  Scooters.  Crutches.  Turn on the lights when you go into a dark area. Replace any light bulbs as soon as they burn out.  Set up your furniture so you have a clear path. Avoid moving your furniture around.  If any of your floors are uneven, fix them.  If there are any pets around you, be aware of where they are.  Review your medicines with your doctor. Some medicines can make you feel dizzy. This can increase your chance of falling. Ask your doctor what other things that you can do to help prevent falls. This information is not intended to replace advice given to you by your health care provider. Make sure you discuss any questions you have with your health care provider. Document Released: 01/15/2009 Document Revised: 08/27/2015 Document Reviewed: 04/25/2014 Elsevier Interactive Patient Education  2017 [employer].

## 2020-02-12 NOTE — Telephone Encounter (Signed)
[facility] - Client TELEPHONE ADVICE RECORD [facility] Patient Name: [name] Gender: Female DOB: 1927-11-30 Age: 86 Y 43 M 5 D Return Phone Number: [phone] (Primary) Address: [place] City/State/Zip: [place] [address] Client [facility] - Client Client Site [facility] Physician Ria Bush - MD Contact Type Call Who Is Calling Patient / Member / Family / Caregiver Call Type Triage / Clinical Relationship To Patient Self Return Phone Number [phone] (Primary) Chief Complaint Urination Pain Reason for Call Symptomatic / Request for [place] states she has a UTI and has questions on what she can do? She says it hurts. Additional Comment Patient disconnected while on phone. Translation No Nurse Assessment Nurse: Gildardo Pounds, RN, Amy Date/Time (Eastern Time): 02/12/2020 9:55:07 AM Confirm and document reason for call. If symptomatic, describe symptoms. ---Caller states she might have a UTI. She says it hurts when she pees. She had a UTI recently. She was given an antibiotic & it got better. No fever. Does the patient have any new or worsening symptoms? ---Yes Will a triage be completed? ---Yes Related visit to physician within the last 2 weeks? ---No Does the PT have any chronic conditions? (i.e. diabetes, asthma, this includes High risk factors for pregnancy, etc.) ---Yes List chronic conditions. ---DM, heart disease Is this a behavioral health or substance abuse call? ---No Guidelines Guideline Title Affirmed Question Affirmed Notes Nurse Date/Time Eilene Ghazi Time) Urination Pain - Female Diabetes mellitus or weak immune system (e.g., HIV positive, cancer chemo, splenectomy, organ transplant, chronic steroids) [place], RN, Amy 02/12/2020 9:58:19 AM Disp. Time Eilene Ghazi Time) Disposition Final User 02/12/2020 10:00:25 AM See HCP  within 4 Hours (or PCP triage) Yes Lovelace, RN, Amy PLEASE NOTE: All timestamps contained within this report are represented as [place] Standard Time. CONFIDENTIALTY NOTICE: This fax transmission is intended only for the addressee. It contains information that is legally privileged, confidential or otherwise protected from use or disclosure. If you are not the intended recipient, you are strictly prohibited from reviewing, disclosing, copying using or disseminating any of this information or taking any action in reliance on or regarding this information. If you have received this fax in error, please notify us immediately by telephone so that we can arrange for its return to [place]. Phone: [phone], Toll-Free: [phone], Fax: [phone] Page: 2 of 2 Call Id: [id number] [place] Disagree/Comply Comply Caller Understands Yes PreDisposition InappropriateToAsk Care Advice Given Per Guideline CALL BACK IF: * You become worse SEE HCP (OR PCP TRIAGE) WITHIN 4 HOURS: * IF OFFICE WILL BE OPEN: You need to be seen within the next 3 or 4 hours. Call your doctor (or NP/PA) now or as soon as the office opens. CARE ADVICE given per Urination Pain - Female (Adult) guideline. Comments User: Wayne Sever, RN Date/Time Eilene Ghazi Time): 02/12/2020 10:07:24 AM SPoke to [place] at the [facility]. She stated they have no available appts. today. She recommended a couple UC's in the patient's area. Notified patient, but she states she will not go to UC. It was a disaster last time. Warm transferred her to [facility] at the [facility] to make an appt. tomorrow. Referrals Warm transfer to backline

## 2020-02-12 NOTE — Addendum Note (Signed)
Addended by: Ria Bush on: 02/12/2020 05:11 PM   Modules accepted: Orders

## 2020-02-12 NOTE — Progress Notes (Signed)
PCP notes:  Health Maintenance: [place] exam- due   Abnormal Screenings: none   Patient concerns: none   Nurse concerns: none   Next PCP appt.: 02/26/2020 @ 2:30 pm

## 2020-02-12 NOTE — Telephone Encounter (Signed)
PT CALLED IN DUE TO SHE HAS A UTI AND DR. Darnell Level WROTE A PRESCRIPTION FOR THE UTI  A WHILE AGO AND WANTED TO KNOW IF HE CAN WRITE ANOTHER ONE AND ITS BEEN HURTING FOR 2-3 DAYS.

## 2020-02-12 NOTE — Progress Notes (Signed)
Subjective:   [name] is a 86 y.o. female who presents for Medicare Annual (Subsequent) preventive examination.  Review of Systems: N/A     I connected with the patient today by telephone and verified that I am speaking with the correct person using two identifiers. Location patient: home Location nurse: work Persons participating in the telephone visit: patient, nurse.   I discussed the limitations, risks, security and privacy concerns of performing an evaluation and management service by telephone and the availability of in person appointments. I also discussed with the patient that there may be a patient responsible charge related to this service. The patient expressed understanding and verbally consented to this telephonic visit.        Cardiac Risk Factors include: advanced age (>97men, >58 women);diabetes mellitus;hypertension     Objective:    Today's Vitals   There is no height or weight on file to calculate BMI.  Advanced Directives 02/12/2020 09/02/2019 09/02/2019 07/16/2019 02/27/2019 02/08/2019 02/07/2019  Does Patient Have a Medical Advance Directive? Yes Yes Yes No Yes Yes No  Type of Paramedic of [place];Living will Healthcare Power of Attorney Living will - [place];Living will Living will;Healthcare Power of Attorney Living will;Healthcare Power of Attorney  Does patient want to make changes to medical advance directive? - No - Patient declined - - No - Patient declined No - Patient declined -  Copy of [place] in Chart? No - copy requested No - copy requested - - No - copy requested No - copy requested No - copy requested  Would patient like information on creating a medical advance directive? - - - No - Patient declined No - Patient declined No - Patient declined No - Patient declined    Current Medications (verified) Outpatient Encounter Medications as of 02/12/2020  Medication Sig  .  acetaminophen (TYLENOL) 500 MG tablet Take 500 mg by mouth every 6 (six) hours as needed for mild pain.  Marland Kitchen ALPRAZolam (XANAX) 1 MG tablet Take 0.5 tablets (0.5 mg total) by mouth 4 (four) times daily as needed for anxiety.  . B Complex-C (B COMPLEX-VITAMIN C PO) Take 1 tablet by mouth daily. +zinc   . diclofenac sodium (VOLTAREN) 1 % GEL Apply 1 application topically 3 (three) times daily.  Marland Kitchen DILT-XR 120 MG 24 hr capsule TAKE 1 CAPSULE BY MOUTH EVERY DAY  . ELIQUIS 5 MG TABS tablet TAKE 1 TABLET BY MOUTH TWICE A DAY  . famotidine (PEPCID) 20 MG tablet Take 1 tablet (20 mg total) by mouth daily as needed for heartburn (breakthrough heartburn).  . fluticasone (FLONASE) 50 MCG/ACT nasal spray Place into both nostrils.  . furosemide (LASIX) 40 MG tablet Take 1 tablet (40 mg total) by mouth 2 (two) times daily.  Marland Kitchen glucosamine-chondroitin 500-400 MG tablet Take 1 tablet by mouth daily.   Marland Kitchen glucose blood (ONETOUCH VERIO) test strip USE TO TEST BLOOD SUGAR ONCE DAILY. DX:E11.9  . HYDROcodone-acetaminophen (NORCO/VICODIN) 5-325 MG tablet Take 0.5 tablets by mouth 2 (two) times daily as needed for moderate pain or severe pain.  Marland Kitchen loperamide (IMODIUM) 2 MG capsule Take 2 mg by mouth 4 (four) times daily as needed for diarrhea or loose stools.  Marland Kitchen loratadine (CLARITIN) 10 MG tablet Take 10 mg by mouth daily as needed for allergies.  . metoprolol tartrate (LOPRESSOR) 50 MG tablet TAKE 1 TABLET BY MOUTH TWICE A DAY  . Multiple Vitamins-Minerals (ICAPS) CAPS Take 1 capsule by mouth  daily.  . nitroGLYCERIN (NITROSTAT) 0.4 MG SL tablet Place 1 tablet (0.4 mg total) under the tongue every 5 (five) minutes as needed for chest pain.  Marland Kitchen nystatin ointment (MYCOSTATIN) Apply 1 application topically 2 (two) times daily.  Marland Kitchen nystatin-triamcinolone ointment (MYCOLOG) Apply to affected area twice a day as needed for itching  . OXcarbazepine (TRILEPTAL) 150 MG tablet Take 150 mg by mouth daily. Every evening  . pantoprazole  (PROTONIX) 40 MG tablet TAKE 1 TABLET BY MOUTH TWICE A DAY  . pioglitazone (ACTOS) 15 MG tablet Take 15 mg by mouth daily.  . polyethylene glycol powder (GLYCOLAX/MIRALAX) powder Take 17 g by mouth daily. Hold for diarrhea  . potassium chloride (MICRO-K) 10 MEQ CR capsule Take 1 capsule (10 mEq total) by mouth daily.  . pravastatin (PRAVACHOL) 20 MG tablet TAKE 1 TABLET BY MOUTH EVERY DAY  . Probiotic Product (ALIGN) 4 MG CAPS Take 1 capsule (4 mg total) by mouth daily.  . sitaGLIPtin (JANUVIA) 100 MG tablet Take 1 tablet by mouth once daily.  . traZODone (DESYREL) 150 MG tablet Take 0.5 tablets (75 mg total) by mouth at bedtime.  . vitamin E 1000 UNIT capsule Take 1,000 Units by mouth daily.  . cephALEXin (KEFLEX) 500 MG capsule Take 1 capsule (500 mg total) by mouth 2 (two) times daily. (Patient not taking: Reported on 01/22/2020)   No facility-administered encounter medications on file as of 02/12/2020.    Allergies (verified) Metformin and related   History: Past Medical History:  Diagnosis Date  . Actinic keratosis 01/22/2020   right forearm  . Acute deep vein thrombosis (DVT) of distal vein of right lower extremity ([place]) 12/2013   Acute thrombus of R proximal gastrocnemius vein. Symptomatic provoked distal DVT  . Anxiety   . Basal cell carcinoma 10/30/2018   R nasal bridge, treated with Moh's  . Basal cell carcinoma 01/22/2020   left forearm, nodular  . Cellulitis of leg, left 01/16/2014  . CHF (congestive heart failure) ([place])   . Chronic back pain 03/2012   lumbar DDD with herniation - s/p L S1 transforaminal ESI (10/2012 Dr. Sharlet Salina)  . Chronic venous insufficiency   . Coronary artery disease    CABG 1998  . Cystocele   . Depression   . Diabetes mellitus    type 2, followed by endo.  . Diastolic dysfunction   . Diverticulosis   . Hemorrhoids   . History of heart attack   . Hx of echocardiogram    a. Echo 10/13: mild LVH, EF 55-60%, Gr 1 diast dysfn, PASP 32  .  Hyperlipidemia   . Hypertension   . Irritable bowel syndrome   . Microcytic anemia   . PAD (peripheral artery disease) ([place]) 2013   ABI: R 0.91, L 0.83  . Postmenopausal osteoporosis 01/2019   T score of -2.6  . PUD (peptic ulcer disease)   . Varicose veins   . Venous ulcer of leg ([place]) 12/12/2013   Required prolonged treatment by wound center  . Viral gastroenteritis due to [place] virus 05/13/2015   hospitalization   Past Surgical History:  Procedure Laterality Date  . ABDOMINAL HYSTERECTOMY  1975   TAH,BSO  . ABI  2013   R 0.91, L 0.83  . APPENDECTOMY     per pt report  . CARDIOVASCULAR STRESS TEST  2015   low risk myoview (Nahser)  . CATARACT SURGERY    . CHOLECYSTECTOMY    . COLONOSCOPY  02/2010   extensive diverticulosis  throughout, small internal hemorrhoids, rec rpt 5 yrs (Dr. Allyn Kenner)  . CORONARY ARTERY BYPASS GRAFT  1998  . dexa scan  09/2011   femur -0.8, forearm 0.6 => normal  . ENDOVENOUS ABLATION SAPHENOUS VEIN W/ LASER Left 03/2014   Kellie Simmering  . ESI  2014   L S1 transforaminal ESI x2 ([place]) without improvement  . HAND SURGERY    . lexiscan myoview  03/2011   Small basal inferolateral and anterolateral reversible perfusion defect suggests ischemia.  EF was normal.   old infarct, no new ischemia  . LOWER EXTREMITY ANGIOGRAPHY Right 09/06/2018   Procedure: LOWER EXTREMITY ANGIOGRAPHY;  Surgeon: Algernon Huxley, MD;  Location: [facility];  Service: Cardiovascular;  Laterality: Right;  . toe surgery    . TYMPANOPLASTY  1960s   R side  . Vault prolapse A & P repair  2009   Dr Rhodia Albright [facility] hospital   Family History  Problem Relation Age of Onset  . Hypertension Mother   . Stroke Mother   . Diabetes Father   . Diabetes Sister   . Diabetes Brother   . Heart disease Son   . Colon cancer Neg Hx    Social History   Socioeconomic History  . Marital status: Single    Spouse name: Not on file  . Number of children: Not on file  . Years of education:  Not on file  . Highest education level: Not on file  Occupational History  . Occupation: Waitress   Tobacco Use  . Smoking status: Former Smoker    Quit date: 03/05/1979    Years since quitting: 40.9  . Smokeless tobacco: Never Used  Vaping Use  . Vaping Use: Never used  Substance and Sexual Activity  . Alcohol use: No    Alcohol/week: 0.0 standard drinks  . Drug use: No  . Sexual activity: Not Currently    Comment: 1st intercourse 86yo.--- 2 sexual partners  Other Topics Concern  . 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   Independent for all ADLs   Social Determinants of Health   Financial Resource Strain: Low Risk   . Difficulty of Paying Living Expenses: Not hard at all  Food Insecurity: No Food Insecurity  . Worried About Charity fundraiser in the Last Year: Never true  . Ran Out of Food in the Last Year: Never true  Transportation Needs: No Transportation Needs  . Lack of Transportation (Medical): No  . Lack of Transportation (Non-Medical): No  Physical Activity: Insufficiently Active  . Days of Exercise per Week: 7 days  . Minutes of Exercise per Session: 20 min  Stress: Stress Concern Present  . Feeling of Stress : To some extent  Social Connections:   . Frequency of Communication with Friends and Family: Not on file  . Frequency of Social Gatherings with Friends and Family: Not on file  . Attends Religious Services: Not on file  . Active Member of Clubs or Organizations: Not on file  . Attends Archivist Meetings: Not on file  . Marital Status: Not on file    Tobacco Counseling Counseling given: Not Answered   Clinical Intake:  Pre-visit preparation completed: Yes  Pain : 0-10 Pain Type: Chronic pain Pain Location: Leg Pain Orientation: Left, Right Pain Descriptors / Indicators: Aching Pain Onset: More than a  month ago Pain Frequency: Intermittent     Nutritional Risks: None Diabetes: Yes CBG done?: No Did pt. bring in CBG monitor from home?: No  How often do you need to have someone help you when you read instructions, pamphlets, or other written materials from your doctor or pharmacy?: 1 - Never What is the last grade level you completed in school?: 9th  Diabetic: Yes Nutrition Risk Assessment:  Has the patient had any N/V/D within the last 2 months?  No  Does the patient have any non-healing wounds?  No  Has the patient had any unintentional weight loss or weight gain?  No   Diabetes:  Is the patient diabetic?  Yes  If diabetic, was a CBG obtained today?  No  Did the patient bring in their glucometer from home?  N/A, telephone visit How often do you monitor your CBG's? daily.   Financial Strains and Diabetes Management:  Are you having any financial strains with the device, your supplies or your medication? No .  Does the patient want to be seen by Chronic Care Management for management of their diabetes?  No  Would the patient like to be referred to a Nutritionist or for Diabetic Management?  No   Diabetic Exams:  Diabetic Eye Exam: Completed 06/03/2019 Diabetic Foot Exam: Overdue, Pt has been advised about the importance in completing this exam. Pt is scheduled for diabetic foot exam on 02/26/2020.   Interpreter Needed?: No  Information entered by :: CJohnson, LPN   Activities of Daily Living In your present state of health, do you have any difficulty performing the following activities: 02/12/2020 09/11/2019  Hearing? Tempie Donning  Comment wears a hearing aid -  Vision? N N  Difficulty concentrating or making decisions? N Y  Walking or climbing stairs? N Y  Dressing or bathing? N N  Doing errands, shopping? Y -  Comment does not drive anymore -  Preparing Food and eating ? N -  Using the Toilet? N -  In the past six months, have you accidently leaked urine? Y -  Comment  urine leakage sometimes -  Do you have problems with loss of bowel control? N -  Managing your Medications? N -  Managing your Finances? N -  Housekeeping or managing your Housekeeping? N -  Some recent data might be hidden    Patient Care Team: Ria Bush, MD as PCP - General (Family Medicine) Nahser, Wonda Cheng, MD as PCP - Cardiology (Cardiology)  Indicate any recent Medical Services you may have received from other than [facility] providers in the past year (date may be approximate).     Assessment:   This is a routine wellness examination for [place].  Hearing/Vision screen  Hearing Screening   125Hz  250Hz  500Hz  1000Hz  2000Hz  3000Hz  4000Hz  6000Hz  8000Hz   Right ear:           Left ear:           Vision Screening Comments: Patient gets annual eye exams  Dietary issues and exercise activities discussed: Current Exercise Habits: Home exercise routine, Type of exercise: stretching, Time (Minutes): 15, Frequency (Times/Week): 7, Weekly Exercise (Minutes/Week): 105, Intensity: Mild  Goals    . Patient Stated     Starting 01/24/2018, I will continue to take medications as prescribed.     . Patient Stated     02/05/2019, I will maintain and continue medications as prescribed.     . Patient Stated     02/12/2020, I will continue  to do leg exercises daily for about 15 minutes.       Depression Screen PHQ 2/9 Scores 02/12/2020 02/27/2019 02/05/2019 01/24/2018 12/08/2016 11/16/2015 06/17/2013  PHQ - 2 Score 4 6 6  0 0 1 3  PHQ- 9 Score 4 12 12  0 - - 7    Fall Risk Fall Risk  02/12/2020 09/11/2019 04/30/2019 02/27/2019 02/05/2019  Falls in the past year? 1 1 0 0 1  Comment loss balance and fell - - - -  Number falls in past yr: 0 0 - - 0  Injury with Fall? 0 0 - - 0  Risk Factor Category  - - - - -  Risk for fall due to : Medication side effect;Impaired balance/gait History of fall(s);Impaired balance/gait;Impaired mobility - - Medication side effect;History of fall(s)  Follow up  Falls evaluation completed;Falls prevention discussed Falls evaluation completed - - Falls evaluation completed;Falls prevention discussed    Any stairs in or around the home? Yes  If so, are there any without handrails? No  Home free of loose throw rugs in walkways, pet beds, electrical cords, etc? Yes  Adequate lighting in your home to reduce risk of falls? Yes   ASSISTIVE DEVICES UTILIZED TO PREVENT FALLS:  Life alert? No  Use of a cane, walker or w/c? Yes  Grab bars in the bathroom? Yes  Shower chair or bench in shower? No  Elevated toilet seat or a handicapped toilet? No   TIMED UP AND GO:  Was the test performed? N/A, telephone visit.   Cognitive Function: MMSE - Mini Mental State Exam 02/12/2020 02/05/2019 01/24/2018 11/16/2015  Orientation to time 5 5 5 5   Orientation to Place 5 5 5 5   Registration 2 3 3 3   Attention/ Calculation 5 0 0 0  Attention/Calculation-comments - refused to do this - -  Recall 3 3 3 3   Language- name 2 objects - - 0 0  Language- repeat 1 1 1 1   Language- follow 3 step command - - 3 0  Language- follow 3 step command-comments - - - pt was unable to follow 3 of 3 step command  Language- read & follow direction - - 0 0  Write a sentence - - 0 0  Copy design - - 0 0  Total score - - 20 17  Mini Cog  Mini-Cog screen was completed. Maximum score is 22. A value of 0 denotes this part of the MMSE was not completed or the patient failed this part of the Mini-Cog screening.       Immunizations Immunization History  Administered Date(s) Administered  . Fluad Quad(high Dose 65+) 01/08/2019, 12/25/2019  . Influenza Split 12/20/2010  . Influenza, High Dose Seasonal PF 03/02/2015, 01/15/2018  . Influenza,inj,Quad PF,6+ Mos 01/01/2013, 12/12/2013, 12/15/2015, 01/09/2017  . Janssen (J&J) SARS-COV-2 Vaccination 06/11/2019  . Pneumococcal Conjugate-13 06/17/2013  . Pneumococcal Polysaccharide-23 11/16/2015  . Td 10/19/2017  . Zoster 09/06/2010     TDAP status: Up to date Flu Vaccine status: Up to date Pneumococcal vaccine status: Up to date Covid-19 vaccine status: Completed vaccines  Qualifies for Shingles Vaccine? Yes   Zostavax completed Yes   Shingrix Completed?: No.    Education has been provided regarding the importance of this vaccine. Patient has been advised to call insurance company to determine out of pocket expense if they have not yet received this vaccine. Advised may also receive vaccine at local pharmacy or Health Dept. Verbalized acceptance and understanding.  Screening Tests Health Maintenance  Topic  Date Due  . FOOT EXAM  04/12/2019  . COLONOSCOPY  04/03/2049 (Originally 02/12/2015)  . MAMMOGRAM  05/12/2020  . OPHTHALMOLOGY EXAM  06/02/2020  . HEMOGLOBIN A1C  06/22/2020  . TETANUS/TDAP  10/20/2027  . INFLUENZA VACCINE  Completed  . DEXA SCAN  Completed  . COVID-19 Vaccine  Completed  . PNA vac Low Risk Adult  Completed    Health Maintenance  Health Maintenance Due  Topic Date Due  . FOOT EXAM  04/12/2019    Colorectal cancer screening: No longer required.  Mammogram status: No longer required.  Bone Density status: Completed 01/10/2019. Results reflect: Bone density results: OSTEOPOROSIS. Repeat every 2 years.  Lung Cancer Screening: (Low Dose CT Chest recommended if Age 42-80 years, 30 pack-year currently smoking OR have quit w/in 15years.) does not qualify.    Additional Screening:  Hepatitis C Screening: does not qualify; Completed N/A  Vision Screening: Recommended annual ophthalmology exams for early detection of glaucoma and other disorders of the eye. Is the patient up to date with their annual eye exam?  Yes  Who is the provider or what is the name of the office in which the patient attends annual eye exams? Dr. Lorie Apley If pt is not established with a provider, would they like to be referred to a provider to establish care? No .   Dental Screening: Recommended annual dental  exams for proper oral hygiene  Community Resource Referral / Chronic Care Management: CRR required this visit?  No   CCM required this visit?  No      Plan:     I have personally reviewed and noted the following in the patient's chart:   . Medical and social history . Use of alcohol, tobacco or illicit drugs  . Current medications and supplements . Functional ability and status . Nutritional status . Physical activity . Advanced directives . List of other physicians . Hospitalizations, surgeries, and ER visits in previous 12 months . Vitals . Screenings to include cognitive, depression, and falls . Referrals and appointments  In addition, I have reviewed and discussed with patient certain preventive protocols, quality metrics, and best practice recommendations. A written personalized care plan for preventive services as well as general preventive health recommendations were provided to patient.   Due to this being a telephonic visit, the after visit summary with patients personalized plan was offered to patient via office or my-chart. Patient preferred to pick up at office at next visit or via mychart.   Andrez Grime, LPN   02/63/[id number]

## 2020-02-12 NOTE — Telephone Encounter (Signed)
Pt dropped off urine sample.  UA ran and documented.  Ordered UCX and spun urine.

## 2020-02-12 NOTE — Telephone Encounter (Addendum)
UA/micro concerning for infection - please have her start keflex 500mg  bid 7d course sent to pharmacy. Please schedule phone call for tomorrow afternoon ~2-3pm and I will call her around that time.   Lab Results  Component Value Date   CREATININE 1.23 (H) 12/24/2019   BUN 30 (H) 12/24/2019   NA 136 12/24/2019   K 4.2 12/24/2019   CL 97 12/24/2019   CO2 33 (H) 12/24/2019   GFR 40  Micro: WBC TNTC RBC rare Bact 3+ rods Epi rare Casts none UCx sent

## 2020-02-12 NOTE — Telephone Encounter (Signed)
Spoke with pt relaying Dr. G's message. Pt verbalizes understanding.  

## 2020-02-13 ENCOUNTER — Encounter: Payer: Self-pay | Admitting: Family Medicine

## 2020-02-13 ENCOUNTER — Telehealth (INDEPENDENT_AMBULATORY_CARE_PROVIDER_SITE_OTHER): Payer: Medicare Other | Admitting: Family Medicine

## 2020-02-13 DIAGNOSIS — N3001 Acute cystitis with hematuria: Secondary | ICD-10-CM | POA: Diagnosis not present

## 2020-02-13 NOTE — Telephone Encounter (Signed)
Lab visit r/s on 02/19/20 at 1:30.  Pt had wellness visit yesterday.  FYI to Dr. Darnell Level.

## 2020-02-13 NOTE — Progress Notes (Addendum)
   [name] - 86 y.o. female  MRN [id number]  Date of Birth: 11-24-1927  PCP: Ria Bush, MD  This service was provided via telemedicine. Phone Visit performed on 02/13/2020    Rationale for phone visit along with limitations reviewed. I discussed the limitations, risks, security and privacy concerns of performing a phone visit and the availability of in person appointments. I also discussed with the patient that there may be a patient responsible charge related to this service. Patient consented to telephone encounter.    Location of patient: at home Location of provider: at home   Names of persons and role in encounter: Provider: Ria Bush, MD  Patient: [name]  Other: N/A   Time on call: 12:25pm - 12:35pm   Subjective: Chief Complaint  Patient presents with  . Urinary Tract Infection     HPI:  4d h/o vaginal pain with urination, cloudy urine, increased frequency and urgency.  No change in abd discomfort.  No fevers/chills, nausea/vomiting, flank pain.  Some constipation.  She started keflex last night, second dose this morning.  UA abnormal yesterday (see below). Urine culture pending.   Objective/Observations:  No physical exam or vital signs collected unless specifically identified below.   BP 135/74   Temp: "normal"  Respiratory status: speaks in complete sentences without evident shortness of breath.   Results for orders placed or performed in visit on 02/12/20  POCT Urinalysis Dipstick (Automated)  Result Value Ref Range   Color, UA yellow    Clarity, UA cloudy    Glucose, UA Negative Negative   Bilirubin, UA negative    Ketones, UA negative    Spec Grav, UA 1.020 1.010 - 1.025   Blood, UA 2+    pH, UA 6.0 5.0 - 8.0   Protein, UA Positive (A) Negative   Urobilinogen, UA 0.2 0.2 or 1.0 E.U./dL   Nitrite, UA negative    Leukocytes, UA Large (3+) (A) Negative   *Note: Due to a large number of results and/or encounters for  the requested time period, some results have not been displayed. A complete set of results can be found in Results Review.   Micro: WBC TNTC RBC rare Bact 3+ rods Epi rare Casts none UCx sent  Assessment/Plan:  UTI (urinary tract infection) UA abnormal - Rx keflex 7d course, started last night. Pending UCx. She has already started to feel better after starting antibiotic.  Last UTI was E coli 12/2019 and symptoms fully resolved at that time.    I discussed the assessment and treatment plan with the patient. The patient was provided an opportunity to ask questions and all were answered. The patient agreed with the plan and demonstrated an understanding of the instructions.  Lab Orders  No laboratory test(s) ordered today    No orders of the defined types were placed in this encounter.   The patient was advised to call back or seek an in-person evaluation if the symptoms worsen or if the condition fails to improve as anticipated.  Ria Bush, MD

## 2020-02-13 NOTE — Telephone Encounter (Signed)
Please reschedule lab appt for 02/19/2020.  Would see if health advisor can also see her for medicare wellness - looks like both appts were cancelled.

## 2020-02-13 NOTE — Assessment & Plan Note (Addendum)
UA abnormal - Rx keflex 7d course, started last night. Pending UCx. She has already started to feel better after starting antibiotic.  Last UTI was E coli 12/2019 and symptoms fully resolved at that time.

## 2020-02-15 LAB — URINE CULTURE
MICRO NUMBER:: 11185898
SPECIMEN QUALITY:: ADEQUATE

## 2020-02-16 ENCOUNTER — Other Ambulatory Visit: Payer: Self-pay | Admitting: Family Medicine

## 2020-02-16 DIAGNOSIS — E1149 Type 2 diabetes mellitus with other diabetic neurological complication: Secondary | ICD-10-CM

## 2020-02-16 DIAGNOSIS — N3000 Acute cystitis without hematuria: Secondary | ICD-10-CM

## 2020-02-16 DIAGNOSIS — E1122 Type 2 diabetes mellitus with diabetic chronic kidney disease: Secondary | ICD-10-CM

## 2020-02-16 DIAGNOSIS — E785 Hyperlipidemia, unspecified: Secondary | ICD-10-CM

## 2020-02-19 ENCOUNTER — Ambulatory Visit: Payer: Medicare Other | Admitting: Family Medicine

## 2020-02-19 ENCOUNTER — Other Ambulatory Visit: Payer: Self-pay

## 2020-02-19 ENCOUNTER — Other Ambulatory Visit: Payer: Medicare Other

## 2020-02-19 ENCOUNTER — Other Ambulatory Visit (INDEPENDENT_AMBULATORY_CARE_PROVIDER_SITE_OTHER): Payer: Medicare Other

## 2020-02-19 DIAGNOSIS — N3000 Acute cystitis without hematuria: Secondary | ICD-10-CM | POA: Diagnosis not present

## 2020-02-19 DIAGNOSIS — E1122 Type 2 diabetes mellitus with diabetic chronic kidney disease: Secondary | ICD-10-CM

## 2020-02-19 DIAGNOSIS — N183 Chronic kidney disease, stage 3 unspecified: Secondary | ICD-10-CM | POA: Diagnosis not present

## 2020-02-19 LAB — MICROALBUMIN / CREATININE URINE RATIO
Creatinine,U: 35.8 mg/dL
Microalb Creat Ratio: 2 mg/g (ref 0.0–30.0)
Microalb, Ur: <0.7 mg/dL (ref 0.0–1.9)

## 2020-02-19 LAB — RENAL FUNCTION PANEL
Albumin: 3.8 g/dL (ref 3.5–5.2)
BUN: 28 mg/dL — ABNORMAL HIGH (ref 6–23)
CO2: 35 mEq/L — ABNORMAL HIGH (ref 19–32)
Calcium: 9.5 mg/dL (ref 8.4–10.5)
Chloride: 99 mEq/L (ref 96–112)
Creatinine, Ser: 1.05 mg/dL (ref 0.40–1.20)
GFR: 46.71 mL/min — ABNORMAL LOW (ref >60.00)
Glucose, Bld: 130 mg/dL — ABNORMAL HIGH (ref 70–99)
Phosphorus: 3.4 mg/dL (ref 2.3–4.6)
Potassium: 4.2 mEq/L (ref 3.5–5.1)
Sodium: 140 mEq/L (ref 135–145)

## 2020-02-19 LAB — CBC WITH DIFFERENTIAL/PLATELET
Basophils Absolute: 0.1 10*3/uL (ref 0.0–0.1)
Basophils Relative: 0.7 % (ref 0.0–3.0)
Eosinophils Absolute: 0.2 10*3/uL (ref 0.0–0.7)
Eosinophils Relative: 1.9 % (ref 0.0–5.0)
HCT: 40.1 % (ref 36.0–46.0)
Hemoglobin: 13.1 g/dL (ref 12.0–15.0)
Lymphocytes Relative: 18 % (ref 12.0–46.0)
Lymphs Abs: 1.7 10*3/uL (ref 0.7–4.0)
MCHC: 32.6 g/dL (ref 30.0–36.0)
MCV: 90.3 fl (ref 78.0–100.0)
Monocytes Absolute: 0.8 10*3/uL (ref 0.1–1.0)
Monocytes Relative: 8.5 % (ref 3.0–12.0)
Neutro Abs: 6.6 10*3/uL (ref 1.4–7.7)
Neutrophils Relative %: 70.9 % (ref 43.0–77.0)
Platelets: 223 10*3/uL (ref 150.0–400.0)
RBC: 4.44 Mil/uL (ref 3.87–5.11)
RDW: 15.6 % — ABNORMAL HIGH (ref 11.5–15.5)
WBC: 9.3 10*3/uL (ref 4.0–10.5)

## 2020-02-19 LAB — URINALYSIS, ROUTINE W REFLEX MICROSCOPIC
Bilirubin Urine: NEGATIVE
Hgb urine dipstick: NEGATIVE
Ketones, ur: NEGATIVE
Leukocytes,Ua: NEGATIVE
Nitrite: NEGATIVE
RBC / HPF: NONE SEEN (ref 0–?)
Specific Gravity, Urine: 1.02 (ref 1.000–1.030)
Total Protein, Urine: NEGATIVE
Urine Glucose: NEGATIVE
Urobilinogen, UA: 0.2 (ref 0.0–1.0)
pH: 6 (ref 5.0–8.0)

## 2020-02-19 NOTE — Addendum Note (Signed)
Addended by: Ria Bush on: 02/19/2020 01:30 PM   Modules accepted: Orders

## 2020-02-20 ENCOUNTER — Other Ambulatory Visit: Payer: Self-pay | Admitting: Family Medicine

## 2020-02-26 ENCOUNTER — Ambulatory Visit (INDEPENDENT_AMBULATORY_CARE_PROVIDER_SITE_OTHER): Payer: Medicare Other | Admitting: Family Medicine

## 2020-02-26 ENCOUNTER — Other Ambulatory Visit: Payer: Self-pay

## 2020-02-26 ENCOUNTER — Encounter: Payer: Self-pay | Admitting: Family Medicine

## 2020-02-26 VITALS — BP 116/62 | HR 64 | Temp 97.9°F | Ht <= 58 in | Wt 139.1 lb

## 2020-02-26 DIAGNOSIS — I25119 Atherosclerotic heart disease of native coronary artery with unspecified angina pectoris: Secondary | ICD-10-CM

## 2020-02-26 DIAGNOSIS — E1151 Type 2 diabetes mellitus with diabetic peripheral angiopathy without gangrene: Secondary | ICD-10-CM

## 2020-02-26 DIAGNOSIS — E1149 Type 2 diabetes mellitus with other diabetic neurological complication: Secondary | ICD-10-CM | POA: Diagnosis not present

## 2020-02-26 DIAGNOSIS — N183 Chronic kidney disease, stage 3 unspecified: Secondary | ICD-10-CM

## 2020-02-26 DIAGNOSIS — I480 Paroxysmal atrial fibrillation: Secondary | ICD-10-CM | POA: Diagnosis not present

## 2020-02-26 DIAGNOSIS — Z8673 Personal history of transient ischemic attack (TIA), and cerebral infarction without residual deficits: Secondary | ICD-10-CM

## 2020-02-26 DIAGNOSIS — E785 Hyperlipidemia, unspecified: Secondary | ICD-10-CM

## 2020-02-26 DIAGNOSIS — I5032 Chronic diastolic (congestive) heart failure: Secondary | ICD-10-CM

## 2020-02-26 DIAGNOSIS — E1122 Type 2 diabetes mellitus with diabetic chronic kidney disease: Secondary | ICD-10-CM | POA: Diagnosis not present

## 2020-02-26 DIAGNOSIS — M4807 Spinal stenosis, lumbosacral region: Secondary | ICD-10-CM | POA: Diagnosis not present

## 2020-02-26 DIAGNOSIS — Z79899 Other long term (current) drug therapy: Secondary | ICD-10-CM

## 2020-02-26 DIAGNOSIS — I872 Venous insufficiency (chronic) (peripheral): Secondary | ICD-10-CM

## 2020-02-26 DIAGNOSIS — Z599 Problem related to housing and economic circumstances, unspecified: Secondary | ICD-10-CM | POA: Diagnosis not present

## 2020-02-26 DIAGNOSIS — E1169 Type 2 diabetes mellitus with other specified complication: Secondary | ICD-10-CM

## 2020-02-26 DIAGNOSIS — I10 Essential (primary) hypertension: Secondary | ICD-10-CM | POA: Diagnosis not present

## 2020-02-26 DIAGNOSIS — N3001 Acute cystitis with hematuria: Secondary | ICD-10-CM

## 2020-02-26 DIAGNOSIS — E1142 Type 2 diabetes mellitus with diabetic polyneuropathy: Secondary | ICD-10-CM | POA: Diagnosis not present

## 2020-02-26 NOTE — Assessment & Plan Note (Signed)
Stable period on current regimen (lasix 40mg  BID, dilt 120mg  daily)

## 2020-02-26 NOTE — Assessment & Plan Note (Signed)
Full resolution after keflex course.

## 2020-02-26 NOTE — Assessment & Plan Note (Signed)
Sounds irregular today. Continue eliquis.

## 2020-02-26 NOTE — Assessment & Plan Note (Signed)
Difficulty affording eliquis and [place]. Referred to [facility] care coordinator

## 2020-02-26 NOTE — Assessment & Plan Note (Signed)
Appreciate endo care.  

## 2020-02-26 NOTE — Assessment & Plan Note (Signed)
Continue pravastatin 20mg  daily.  The ASCVD Risk score Mikey Bussing DC Jr., et al., 2013) failed to calculate for the following reasons:   The 2013 ASCVD risk score is only valid for ages 25 to 21   The patient has a prior MI or stroke diagnosis

## 2020-02-26 NOTE — Assessment & Plan Note (Signed)
Ongoing medication confusion.  Referral to [facility] care coordinator.

## 2020-02-26 NOTE — Assessment & Plan Note (Signed)
Reviewed CKD with patient, encouraged limiting tea and increasing water.

## 2020-02-26 NOTE — Patient Instructions (Addendum)
Call to reschedule eye doctor appointment.  [facility].  I have sent your information to our care coordinator to ask about financial assistance.  If interested, check with pharmacy about new 2 shot shingles series (shingrix).  You are doing well today. Return as needed or in 3 months for follow up visit.   Health Maintenance After Age 84 After age 38, you are at a higher risk for certain long-term diseases and infections as well as injuries from falls. Falls are a major cause of broken bones and head injuries in people who are older than age 30. Getting regular preventive care can help to keep you healthy and well. Preventive care includes getting regular testing and making lifestyle changes as recommended by your health care provider. Talk with your health care provider about:  Which screenings and tests you should have. A screening is a test that checks for a disease when you have no symptoms.  A diet and exercise plan that is right for you. What should I know about screenings and tests to prevent falls? Screening and testing are the best ways to find a health problem early. Early diagnosis and treatment give you the best chance of managing medical conditions that are common after age 57. Certain conditions and lifestyle choices may make you more likely to have a fall. Your health care provider may recommend:  Regular vision checks. Poor vision and conditions such as cataracts can make you more likely to have a fall. If you wear glasses, make sure to get your prescription updated if your vision changes.  Medicine review. Work with your health care provider to regularly review all of the medicines you are taking, including over-the-counter medicines. Ask your health care provider about any side effects that may make you more likely to have a fall. Tell your health care provider if any medicines that you take make you feel dizzy or sleepy.  Osteoporosis screening.  Osteoporosis is a condition that causes the bones to get weaker. This can make the bones weak and cause them to break more easily.  Blood pressure screening. Blood pressure changes and medicines to control blood pressure can make you feel dizzy.  Strength and balance checks. Your health care provider may recommend certain tests to check your strength and balance while standing, walking, or changing positions.  Foot health exam. Foot pain and numbness, as well as not wearing proper footwear, can make you more likely to have a fall.  Depression screening. You may be more likely to have a fall if you have a fear of falling, feel emotionally low, or feel unable to do activities that you used to do.  Alcohol use screening. Using too much alcohol can affect your balance and may make you more likely to have a fall. What actions can I take to lower my risk of falls? General instructions  Talk with your health care provider about your risks for falling. Tell your health care provider if: ? You fall. Be sure to tell your health care provider about all falls, even ones that seem minor. ? You feel dizzy, sleepy, or off-balance.  Take over-the-counter and prescription medicines only as told by your health care provider. These include any supplements.  Eat a healthy diet and maintain a healthy weight. A healthy diet includes low-fat dairy products, low-fat (lean) meats, and fiber from whole grains, beans, and lots of fruits and vegetables. Home safety  Remove any tripping hazards, such as rugs, cords,  and clutter.  Install safety equipment such as grab bars in bathrooms and safety rails on stairs.  Keep rooms and walkways well-lit. Activity   Follow a regular exercise program to stay fit. This will help you maintain your balance. Ask your health care provider what types of exercise are appropriate for you.  If you need a cane or walker, use it as recommended by your health care provider.  Wear  supportive shoes that have nonskid soles. Lifestyle  Do not drink alcohol if your health care provider tells you not to drink.  If you drink alcohol, limit how much you have: ? 0-1 drink a day for women. ? 0-2 drinks a day for men.  Be aware of how much alcohol is in your drink. In the [place] one drink equals one typical bottle of beer (12 oz), one-half glass of wine (5 oz), or one shot of hard liquor (1 oz).  Do not use any products that contain nicotine or tobacco, such as cigarettes and e-cigarettes. If you need help quitting, ask your health care provider. Summary  Having a healthy lifestyle and getting preventive care can help to protect your health and wellness after age 96.  Screening and testing are the best way to find a health problem early and help you avoid having a fall. Early diagnosis and treatment give you the best chance for managing medical conditions that are more common for people who are older than age 32.  Falls are a major cause of broken bones and head injuries in people who are older than age 60. Take precautions to prevent a fall at home.  Work with your health care provider to learn what changes you can make to improve your health and wellness and to prevent falls. This information is not intended to replace advice given to you by your health care provider. Make sure you discuss any questions you have with your health care provider. Document Revised: 07/12/2018 Document Reviewed: 02/01/2017 Elsevier Patient Education  2020 [employer].

## 2020-02-26 NOTE — Assessment & Plan Note (Signed)
Continue statin and eliquis.

## 2020-02-26 NOTE — Progress Notes (Signed)
Patient ID: [name], female    DOB: 1927-10-11, 86 y.o.   MRN: [id number]  This visit was conducted in person.  BP 116/62 (BP Location: Left Arm, Patient Position: Sitting, Cuff Size: Normal)   Pulse 64   Temp 97.9 F (36.6 C) (Temporal)   Ht 4' 7.5" (1.41 m)   Wt 139 lb 2 oz (63.1 kg)   SpO2 95%   BMI 31.76 kg/m    CC: AMW f/u visit  Subjective:   HPI: [name] is a 86 y.o. female presenting on 02/26/2020 for Annual Exam (Prt 2.  Pt accompanied by friend, [name]- temp 97.8.)   Saw health advisor earlier this month for medicare wellness visit. Note reviewed.    No exam data present    Clinical Support from 02/12/2020 in [place] at [facility] Total Score 4      Fall Risk  02/12/2020 09/11/2019 04/30/2019 02/27/2019 02/05/2019  Falls in the past year? 1 1 0 0 1  Comment loss balance and fell - - - -  Number falls in past yr: 0 0 - - 0  Injury with Fall? 0 0 - - 0  Risk Factor Category  - - - - -  Risk for fall due to : Medication side effect;Impaired balance/gait History of fall(s);Impaired balance/gait;Impaired mobility - - Medication side effect;History of fall(s)  Follow up Falls evaluation completed;Falls prevention discussed Falls evaluation completed - - Falls evaluation completed;Falls prevention discussed   Recent UTI symptoms treated with keflex 7d course with full resolution.  UCx grew >100k E coli resistant to amp otherwise pan-sensitive.  Having some bottom pain. Unable to sleep in her bed, so sleeps in lift chair.  Not interested in hospital bed.   Having trouble affording eliquis and [place]. Asks for financial assistance.   Her 4th (last) son passed away 12-16-19   Preventative: COLONOSCOPY 02/2010 - extensive diverticulosis throughout, small internal hemorrhoids, rec rpt 5 yrs (Dr. Allyn Kenner) - aged out Well woman exam - sees GYN regularly  Dx mammogram Birads2 05/2019 at [place] - 09/2011 femur -0.8, forearm  0.6 => normal Flu shot yearly  prevnar 15-Jul-2013, pneumovax 07/16/2015 Td Jul 15, 2017 zostavax 07-16-2010 shingrix - discussed Advanced planning discussion: working on this at home. Doesn't know who would be HCPOA. Stresses about completing this.  Seat belt use discussed  Sunscreen use discussed. Sees derm.  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 Interim medical history since our last visit reviewed. Allergies and medications reviewed and updated. Outpatient Medications Prior to Visit  Medication Sig Dispense Refill  . acetaminophen (TYLENOL) 500 MG tablet Take 500 mg by mouth every 6 (six) hours as needed for mild pain.    Marland Kitchen ALPRAZolam (XANAX) 1 MG tablet Take 0.5 tablets (0.5 mg total) by mouth 4 (four) times daily as needed for anxiety.    . B Complex-C (B COMPLEX-VITAMIN C PO) Take 1 tablet by mouth daily. +zinc     . diclofenac sodium (VOLTAREN) 1 % GEL Apply 1 application topically 3 (three) times daily. 1 Tube 1  .  DILT-XR 120 MG 24 hr capsule TAKE 1 CAPSULE BY MOUTH EVERY DAY 30 capsule 6  . ELIQUIS 5 MG TABS tablet TAKE 1 TABLET BY MOUTH TWICE A DAY 60 tablet 11  . famotidine (PEPCID) 20 MG tablet Take 1 tablet (20 mg total) by mouth daily as needed for heartburn (breakthrough heartburn).    . fluticasone (FLONASE) 50 MCG/ACT nasal spray Place into both nostrils.    . furosemide (LASIX) 40 MG tablet Take 1 tablet (40 mg total) by mouth 2 (two) times daily. 180 tablet 1  . glucosamine-chondroitin 500-400 MG tablet Take 1 tablet by mouth daily.     Marland Kitchen glucose blood (ONETOUCH VERIO) test strip USE TO TEST BLOOD SUGAR ONCE DAILY. DX:E11.9 100 each  6  . HYDROcodone-acetaminophen (NORCO/VICODIN) 5-325 MG tablet Take 0.5 tablets by mouth 2 (two) times daily as needed for moderate pain or severe pain. 30 tablet 0  . loperamide (IMODIUM) 2 MG capsule Take 2 mg by mouth 4 (four) times daily as needed for diarrhea or loose stools.    Marland Kitchen loratadine (CLARITIN) 10 MG tablet Take 10 mg by mouth daily as needed for allergies.    . metoprolol tartrate (LOPRESSOR) 50 MG tablet TAKE 1 TABLET BY MOUTH TWICE A DAY 180 tablet 1  . Multiple Vitamins-Minerals (ICAPS) CAPS Take 1 capsule by mouth daily.    . nitroGLYCERIN (NITROSTAT) 0.4 MG SL tablet Place 1 tablet (0.4 mg total) under the tongue every 5 (five) minutes as needed for chest pain. 25 tablet 3  . nystatin ointment (MYCOSTATIN) Apply 1 application topically 2 (two) times daily. 30 g 3  . nystatin-triamcinolone ointment (MYCOLOG) Apply to affected area twice a day as needed for itching    . OXcarbazepine (TRILEPTAL) 150 MG tablet Take 150 mg by mouth daily. Every evening    . pantoprazole (PROTONIX) 40 MG tablet TAKE 1 TABLET BY MOUTH TWICE A DAY 180 tablet 2  . pioglitazone (ACTOS) 15 MG tablet Take 15 mg by mouth daily.    . polyethylene glycol powder (GLYCOLAX/MIRALAX) powder Take 17 g by mouth daily. Hold for diarrhea 3350 g 1  . potassium chloride (MICRO-K) 10 MEQ CR capsule Take 1 capsule (10 mEq total) by mouth daily. 90 capsule 1  . pravastatin (PRAVACHOL) 20 MG tablet TAKE 1 TABLET BY MOUTH EVERY DAY 90 tablet 0  . Probiotic Product (ALIGN) 4 MG CAPS Take 1 capsule (4 mg total) by mouth daily.    . sitaGLIPtin (JANUVIA) 100 MG tablet Take 1 tablet by mouth once daily. 30 tablet 2  . traZODone (DESYREL) 150 MG tablet Take 0.5 tablets (75 mg total) by mouth at bedtime.    . vitamin E 1000 UNIT capsule Take 1,000 Units by mouth daily.    . cephALEXin (KEFLEX) 500 MG capsule Take 1 capsule (500 mg total) by mouth 2 (two) times daily. 14 capsule 0   No facility-administered medications prior to  visit.     Per HPI unless specifically indicated in ROS section below Review of Systems Objective:  BP 116/62 (BP Location: Left Arm, Patient Position: Sitting, Cuff Size: Normal)   Pulse 64   Temp 97.9 F (36.6 C) (Temporal)   Ht 4' 7.5" (1.41 m)   Wt 139 lb 2 oz (63.1 kg)   SpO2 95%   BMI 31.76 kg/m   Wt Readings from Last 3 Encounters:  02/26/20 139 lb 2 oz (63.1 kg)  12/25/19 139 lb 8 oz (63.3 kg)  12/24/19 139 lb 3.2  oz (63.1 kg)      Physical Exam Vitals and nursing note reviewed.  Constitutional:      General: She is not in acute distress.    Appearance: Normal appearance. She is well-developed. She is obese. She is not ill-appearing.     Comments: Uses walker  HENT:     Head: Normocephalic and atraumatic.     Right Ear: Hearing, tympanic membrane, ear canal and external ear normal.     Left Ear: Hearing, tympanic membrane, ear canal and external ear normal.  Eyes:     General: No scleral icterus.    Extraocular Movements: Extraocular movements intact.     Conjunctiva/sclera: Conjunctivae normal.     Pupils: Pupils are equal, round, and reactive to light.  Neck:     Thyroid: No thyroid mass or thyromegaly.     Vascular: No carotid bruit.  Cardiovascular:     Rate and Rhythm: Normal rate. Rhythm irregular.     Pulses: Normal pulses.          Radial pulses are 2+ on the right side and 2+ on the left side.     Heart sounds: Normal heart sounds. No murmur heard.   Pulmonary:     Effort: Pulmonary effort is normal. No respiratory distress.     Breath sounds: Normal breath sounds. No wheezing, rhonchi or rales.  Abdominal:     General: Abdomen is flat. Bowel sounds are normal. There is no distension.     Palpations: Abdomen is soft. There is no mass.     Tenderness: There is abdominal tenderness (mild). There is no guarding or rebound.     Hernia: No hernia is present.  Musculoskeletal:        General: Normal range of motion.     Cervical back: Normal range of  motion and neck supple.     Right lower leg: No edema.     Left lower leg: No edema.     Comments: Compression stockings in place  Lymphadenopathy:     Cervical: No cervical adenopathy.  Skin:    General: Skin is warm and dry.     Findings: No rash.  Neurological:     General: No focal deficit present.     Mental Status: She is alert and oriented to person, place, and time.     Comments: CN grossly intact, station and gait intact  Psychiatric:        Mood and Affect: Mood normal.        Behavior: Behavior normal.        Thought Content: Thought content normal.        Judgment: Judgment normal.       Results for orders placed or performed in visit on 02/19/20  Renal function panel  Result Value Ref Range   Sodium 140 135 - 145 mEq/L   Potassium 4.2 3.5 - 5.1 mEq/L   Chloride 99 96 - 112 mEq/L   CO2 35 (H) 19 - 32 mEq/L   Albumin 3.8 3.5 - 5.2 g/dL   BUN 28 (H) 6 - 23 mg/dL   Creatinine, Ser 1.05 0.40 - 1.20 mg/dL   Glucose, Bld 130 (H) 70 - 99 mg/dL   Phosphorus 3.4 2.3 - 4.6 mg/dL   GFR 46.71 (L) >60.00 mL/min   Calcium 9.5 8.4 - 10.5 mg/dL  CBC with Differential/Platelet  Result Value Ref Range   WBC 9.3 4.0 - 10.5 K/uL   RBC 4.44 3.87 - 5.11 Mil/uL  Hemoglobin 13.1 12.0 - 15.0 g/dL   HCT 40.1 36 - 46 %   MCV 90.3 78.0 - 100.0 fl   MCHC 32.6 30.0 - 36.0 g/dL   RDW 15.6 (H) 11.5 - 15.5 %   Platelets 223.0 150 - 400 K/uL   Neutrophils Relative % 70.9 43 - 77 %   Lymphocytes Relative 18.0 12 - 46 %   Monocytes Relative 8.5 3 - 12 %   Eosinophils Relative 1.9 0 - 5 %   Basophils Relative 0.7 0 - 3 %   Neutro Abs 6.6 1.4 - 7.7 K/uL   Lymphs Abs 1.7 0.7 - 4.0 K/uL   Monocytes Absolute 0.8 0.1 - 1.0 K/uL   Eosinophils Absolute 0.2 0.0 - 0.7 K/uL   Basophils Absolute 0.1 0.0 - 0.1 K/uL  Urinalysis, Routine w reflex microscopic  Result Value Ref Range   Color, Urine YELLOW Yellow;Lt. Yellow;Straw;Dark Yellow;Amber;Green;Red;Brown   APPearance CLEAR  Clear;Turbid;Slightly Cloudy;Cloudy   Specific Gravity, Urine 1.020 1.000 - 1.030   pH 6.0 5.0 - 8.0   Total Protein, Urine NEGATIVE Negative   Urine Glucose NEGATIVE Negative   Ketones, ur NEGATIVE Negative   Bilirubin Urine NEGATIVE Negative   Hgb urine dipstick NEGATIVE Negative   Urobilinogen, UA 0.2 0.0 - 1.0   Leukocytes,Ua NEGATIVE Negative   Nitrite NEGATIVE Negative   WBC, UA 0-2/hpf 0-2/hpf   RBC / HPF none seen 0-2/hpf   Squamous Epithelial / LPF Rare(0-4/hpf) Rare(0-4/hpf)  Microalbumin / creatinine urine ratio  Result Value Ref Range   Microalb, Ur <0.7 0.0 - 1.9 mg/dL   Creatinine,U 35.8 mg/dL   Microalb Creat Ratio 2.0 0.0 - 30.0 mg/g   *Note: Due to a large number of results and/or encounters for the requested time period, some results have not been displayed. A complete set of results can be found in Results Review.   Assessment & Plan:  This visit occurred during the SARS-CoV-2 public health emergency.  Safety protocols were in place, including screening questions prior to the visit, additional usage of staff PPE, and extensive cleaning of exam room while observing appropriate contact time as indicated for disinfecting solutions.   Problem List Items Addressed This Visit    UTI (urinary tract infection)    Full resolution after keflex course.       Type 2 diabetes mellitus with neurological manifestations, controlled ([place])   Spinal stenosis of lumbosacral region - Primary   Polypharmacy    Ongoing medication confusion.  Referral to [facility] care coordinator.       Relevant Orders   AMB Referral to Community Care Coordinaton   Paroxysmal atrial fibrillation ([place])    Sounds irregular today. Continue eliquis.       Hypertension    Stable period on current regimen (lasix 40mg  BID, dilt 120mg  daily)      Hyperlipidemia associated with type 2 diabetes mellitus ([place])    Continue pravastatin 20mg  daily.  The ASCVD Risk score Mikey Bussing DC Jr., et al., 2013) failed to  calculate for the following reasons:   The 2013 ASCVD risk score is only valid for ages 4 to 76   The patient has a prior MI or stroke diagnosis       History of CVA in adulthood    Continue statin and eliquis.       Financial difficulty    Difficulty affording eliquis and [place]. Referred to [facility] care coordinator       Relevant Orders   AMB Referral to  Community Care Coordinaton   Diabetic polyneuropathy ([place])   Diabetes mellitus type 2 with peripheral artery disease ([place])    Appreciate endo care.       CKD stage 3 due to type 2 diabetes mellitus ([place])    Reviewed CKD with patient, encouraged limiting tea and increasing water.       Chronic venous insufficiency   (HFpEF) heart failure with preserved ejection fraction (HCC)    Continues lasix 40mg  BID, seems euvolemic today. Potassium levels normal on 23mEq daily replacement.          No orders of the defined types were placed in this encounter.  Orders Placed This Encounter  Procedures  . AMB Referral to [facility] Coordinaton    Referral Priority:   Routine    Referral Type:   Consultation    Referral Reason:   Care Coordination    Number of Visits Requested:   1    Patient instructions: Call to reschedule eye doctor appointment.  [facility].  I have sent your information to our care coordinator to ask about financial assistance.  If interested, check with pharmacy about new 2 shot shingles series (shingrix).  You are doing well today. Return as needed or in 3 months for follow up visit.   Follow up plan: Return in about 3 months (around 05/28/2020), or if symptoms worsen or fail to improve, for follow up visit.  Ria Bush, MD

## 2020-02-26 NOTE — Assessment & Plan Note (Addendum)
Continues lasix 40mg  BID, seems euvolemic today. Potassium levels normal on 11mEq daily replacement.

## 2020-03-02 ENCOUNTER — Telehealth: Payer: Self-pay | Admitting: Family Medicine

## 2020-03-02 NOTE — Progress Notes (Signed)
°  Chronic Care Management   Outreach Note  03/02/2020 Name: [name] MRN: [id number] DOB: [birth year]  Referred by: Ria Bush, MD Reason for referral : Chronic Care Management   An unsuccessful telephone outreach was attempted today. The patient was referred to the pharmacist for assistance with care management and care coordination.   Follow Up Plan:   [name]  Upstream Scheduler

## 2020-03-09 ENCOUNTER — Telehealth: Payer: Self-pay | Admitting: Family Medicine

## 2020-03-09 NOTE — Chronic Care Management (AMB) (Signed)
  Chronic Care Management   Note  03/09/2020 Name: [name] MRN: [id number] DOB: [birth year]  [name] is a 86 y.o. year old female who is a primary care patient of Ria Bush, MD. I reached out to [place] by phone today in response to a referral sent by [name] Heather's PCP, Ria Bush, MD.   [name] was given information about Chronic Care Management services today including:  1. CCM service includes personalized support from designated clinical staff supervised by her physician, including individualized plan of care and coordination with other care providers 2. 24/7 contact phone numbers for assistance for urgent and routine care needs. 3. Service will only be billed when office clinical staff spend 20 minutes or more in a month to coordinate care. 4. Only one practitioner may furnish and bill the service in a calendar month. 5. The patient may stop CCM services at any time (effective at the end of the month) by phone call to the office staff.   [name] verbally agreed to assistance and services provided by embedded care coordination/care management team today.  Follow up plan:   [place]

## 2020-03-10 DIAGNOSIS — Z23 Encounter for immunization: Secondary | ICD-10-CM | POA: Diagnosis not present

## 2020-03-16 ENCOUNTER — Other Ambulatory Visit: Payer: Self-pay | Admitting: *Deleted

## 2020-03-16 MED ORDER — SITAGLIPTIN PHOSPHATE 100 MG PO TABS
ORAL_TABLET | ORAL | 2 refills | Status: DC
Start: 2020-03-16 — End: 2020-04-08

## 2020-03-17 ENCOUNTER — Other Ambulatory Visit: Payer: Self-pay | Admitting: Family Medicine

## 2020-03-17 MED ORDER — HYDROCODONE-ACETAMINOPHEN 5-325 MG PO TABS: 0.5000 | ORAL_TABLET | Freq: Two times a day (BID) | ORAL | 0 refills | Status: DC | PRN

## 2020-03-17 NOTE — Telephone Encounter (Signed)
Name of Medication: Hydrocodone-APAP Name of [facility]acy: [facility] Last Fill or Written Date and Quantity: 01/15/20, #30 Last Office Visit and Type: 02/26/20, AWV prt 2 Next Office Visit and Type: 06/24/20, 3 mo f/u Last Controlled Substance Agreement Date: 10/16/17 Last UDS: 10/16/17

## 2020-03-17 NOTE — Telephone Encounter (Signed)
ERx 

## 2020-03-17 NOTE — Telephone Encounter (Signed)
Pt called to request a refill on her [facility]codone [facility] [place].

## 2020-03-18 ENCOUNTER — Other Ambulatory Visit: Payer: Self-pay

## 2020-03-18 ENCOUNTER — Ambulatory Visit (INDEPENDENT_AMBULATORY_CARE_PROVIDER_SITE_OTHER): Payer: Medicare Other | Admitting: Dermatology

## 2020-03-18 DIAGNOSIS — L821 Other seborrheic keratosis: Secondary | ICD-10-CM | POA: Diagnosis not present

## 2020-03-18 DIAGNOSIS — L570 Actinic keratosis: Secondary | ICD-10-CM | POA: Diagnosis not present

## 2020-03-18 DIAGNOSIS — C44619 Basal cell carcinoma of skin of left upper limb, including shoulder: Secondary | ICD-10-CM | POA: Diagnosis not present

## 2020-03-18 NOTE — Progress Notes (Addendum)
   Follow-Up Visit   Subjective  [name] is a 86 y.o. female who presents for the following: Actinic Keratosis (Follow up on left forearm and right upper lip. Patient states she is not feeling well today and is not interested in having anything removed. ).  Patient states she feels her heart is beating too fast. Was advised by her primary care to take it easy.    She would like me to look at some spots at her face and arm.  The following portions of the chart were reviewed this encounter and updated as appropriate:  Tobacco  Allergies  Meds  Problems  Med Hx  Surg Hx  Fam Hx        Objective  Well appearing in no visible dress with no increased work of breathing or shortness of breath.  She does talk about the loss of her children who have all passed away during the visit today but mood and affect appear within normal limits.   A focused examination was performed including face and right arm. Relevant physical exam findings are noted in the Assessment and Plan.  Objective  Right forearm: Biopsy proven hypertrophic AK with features of verruca  Objective  Left Forearm: Biopsy proven BCC  Assessment & Plan  Actinic keratosis Right forearm  Patient defers Ln2 until January   Basal cell carcinoma (BCC) of skin of left upper extremity including shoulder Left Forearm  Pt defers EDC treatment today will treat at follow up   Seborrheic Keratoses - Stuck-on, waxy, tan-brown papules and plaques  - Discussed benign etiology and prognosis. - Observe - Call for any changes   Return in about 1 month (around 04/18/2020) for follow up for AK.   Documentation: I have reviewed the above documentation for accuracy and completeness, and I agree with the above.  Forest Gleason, MD

## 2020-03-26 ENCOUNTER — Encounter: Payer: Self-pay | Admitting: Dermatology

## 2020-04-06 ENCOUNTER — Other Ambulatory Visit: Payer: Self-pay | Admitting: Family Medicine

## 2020-04-07 ENCOUNTER — Other Ambulatory Visit: Payer: Self-pay | Admitting: Family Medicine

## 2020-04-08 ENCOUNTER — Telehealth: Payer: Self-pay | Admitting: Endocrinology

## 2020-04-08 ENCOUNTER — Other Ambulatory Visit: Payer: Self-pay | Admitting: *Deleted

## 2020-04-08 ENCOUNTER — Other Ambulatory Visit: Payer: Self-pay | Admitting: Family Medicine

## 2020-04-08 MED ORDER — ONETOUCH DELICA PLUS LANCET33G MISC: 1 refills | Status: AC

## 2020-04-08 MED ORDER — ONETOUCH VERIO VI STRP
ORAL_STRIP | 6 refills | Status: DC
Start: 2020-04-08 — End: 2020-04-09

## 2020-04-08 MED ORDER — SITAGLIPTIN PHOSPHATE 100 MG PO TABS
ORAL_TABLET | ORAL | 2 refills | Status: DC
Start: 2020-04-08 — End: 2020-04-09

## 2020-04-08 NOTE — Telephone Encounter (Signed)
Rxs sent

## 2020-04-08 NOTE — Telephone Encounter (Signed)
Pharmacy called and asked that we resend all her prescriptions over to them.  It is going to [facility]

## 2020-04-08 NOTE — Telephone Encounter (Signed)
Refill refill JANUVIA and all one touch test supplies   To pharmacy        [place] [address] [place] Phone:  [phone]  Fax:  [phone]

## 2020-04-09 ENCOUNTER — Telehealth: Payer: Self-pay

## 2020-04-09 ENCOUNTER — Other Ambulatory Visit: Payer: Self-pay | Admitting: Cardiovascular Disease

## 2020-04-09 ENCOUNTER — Other Ambulatory Visit: Payer: Self-pay | Admitting: Endocrinology

## 2020-04-09 ENCOUNTER — Other Ambulatory Visit: Payer: Self-pay | Admitting: Family Medicine

## 2020-04-09 DIAGNOSIS — I480 Paroxysmal atrial fibrillation: Secondary | ICD-10-CM

## 2020-04-09 NOTE — Telephone Encounter (Signed)
Alprazolam comes through her psychiatrist.

## 2020-04-09 NOTE — Telephone Encounter (Signed)
Pt left v/m requesting cb by Lattie Haw CMA re to getting refill on alprazolam. Pt said she has been on med since 1985. Pt said "Lattie Haw has broken appts and now no want will fill xanax. Per med list xanax is being filled by psychiatrist. It tried calling pt back; cell no answer and v/m not set up and home phone remains busy. Pt last annual 02/26/20 do not see xanax mentioned except on med list. Sending note to Dr Darnell Level who is out of office, Lattie Haw CMA and Dr Diona Browner who is in office.

## 2020-04-09 NOTE — Telephone Encounter (Signed)
Needs to be [name] by PCP or psychiatry

## 2020-04-09 NOTE — Telephone Encounter (Signed)
Prescription refill request for Eliquis received.  Indication: afib  Last office visit: [place], 10/22/2019 Scr: 1.23, 12/24/2019 Age: 87 yo  Weight: 63.1 kg   Prescription refill sent.

## 2020-04-09 NOTE — Chronic Care Management (AMB) (Signed)
Chronic Care Management Pharmacy Assistant   Name: [name]  MRN: [id number] DOB: 08-29-1927  Reason for Encounter: Initial Questions for 04/15/20 appointment    PCP : Ria Bush, MD  Allergies:   Allergies  Allergen Reactions  . Metformin And Related Diarrhea    Medications: Outpatient Encounter Medications as of 04/09/2020  Medication Sig  . acetaminophen (TYLENOL) 500 MG tablet Take 500 mg by mouth every 6 (six) hours as needed for mild pain.  Marland Kitchen ALPRAZolam (XANAX) 1 MG tablet Take 0.5 tablets (0.5 mg total) by mouth 4 (four) times daily as needed for anxiety.  . B Complex-C (B COMPLEX-VITAMIN C PO) Take 1 tablet by mouth daily. +zinc  . diclofenac sodium (VOLTAREN) 1 % GEL Apply 1 application topically 3 (three) times daily.  Marland Kitchen DILT-XR 120 MG 24 hr capsule TAKE 1 CAPSULE BY MOUTH EVERY DAY  . ELIQUIS 5 MG TABS tablet TAKE 1 TABLET BY MOUTH TWICE A DAY  . famotidine (PEPCID) 20 MG tablet Take 1 tablet (20 mg total) by mouth daily as needed for heartburn (breakthrough heartburn).  . fluticasone (FLONASE) 50 MCG/ACT nasal spray Place into both nostrils.  . furosemide (LASIX) 40 MG tablet TAKE 1 TABLET BY MOUTH TWICE A DAY  . glucosamine-chondroitin 500-400 MG tablet Take 1 tablet by mouth daily.  Marland Kitchen HYDROcodone-acetaminophen (NORCO/VICODIN) 5-325 MG tablet Take 0.5 tablets by mouth 2 (two) times daily as needed for moderate pain or severe pain.  . Lancets (ONETOUCH DELICA PLUS [id number]) MISC Use to test blood sugar once daily dx code E11.9  . loperamide (IMODIUM) 2 MG capsule Take 2 mg by mouth 4 (four) times daily as needed for diarrhea or loose stools.  Marland Kitchen loratadine (CLARITIN) 10 MG tablet Take 10 mg by mouth daily as needed for allergies.  . metoprolol tartrate (LOPRESSOR) 50 MG tablet TAKE 1 TABLET BY MOUTH TWICE A DAY  . Multiple Vitamins-Minerals (ICAPS) CAPS Take 1 capsule by mouth daily.  . nitroGLYCERIN (NITROSTAT) 0.4 MG SL tablet Place 1 tablet (0.4 mg  total) under the tongue every 5 (five) minutes as needed for chest pain.  Marland Kitchen nystatin ointment (MYCOSTATIN) Apply 1 application topically 2 (two) times daily.  Marland Kitchen nystatin-triamcinolone ointment (MYCOLOG) Apply to affected area twice a day as needed for itching  . ONETOUCH VERIO test strip USE TO TEST BLOOD SUGAR ONCE DAILY. DX:E11.9  . OXcarbazepine (TRILEPTAL) 150 MG tablet Take 150 mg by mouth daily. Every evening  . pantoprazole (PROTONIX) 40 MG tablet TAKE 1 TABLET BY MOUTH TWICE A DAY  . pioglitazone (ACTOS) 15 MG tablet TAKE 1 TABLET BY MOUTH EVERY DAY  . polyethylene glycol powder (GLYCOLAX/MIRALAX) powder Take 17 g by mouth daily. Hold for diarrhea  . potassium chloride (MICRO-K) 10 MEQ CR capsule TAKE 1 CAPSULE BY MOUTH EVERY DAY  . pravastatin (PRAVACHOL) 20 MG tablet TAKE 1 TABLET BY MOUTH EVERY DAY  . Probiotic Product (ALIGN) 4 MG CAPS Take 1 capsule (4 mg total) by mouth daily.  . sitaGLIPtin (JANUVIA) 100 MG tablet TAKE 1/2 TABLET BY MOUTH EVERY DAY  . traZODone (DESYREL) 150 MG tablet Take 0.5 tablets (75 mg total) by mouth at bedtime.  . vitamin E 1000 UNIT capsule Take 1,000 Units by mouth daily.   No facility-administered encounter medications on file as of 04/09/2020.    Current Diagnosis: Patient Active Problem List   Diagnosis Date Noted  . Financial difficulty 02/26/2020  . UTI (urinary tract infection) 10/01/2019  . CKD stage  3 due to type 2 diabetes mellitus ([place]) 09/02/2019  . History of CVA in adulthood 08/23/2019  . Lethargy 07/27/2019  . Polypharmacy 07/27/2019  . Pneumonia 02/15/2019  . E coli bacteremia 02/13/2019  . Dyspnea   . Diverticulitis of duodenum   . Duodenal ulcer perforation ([place]) 02/08/2019  . Chronic leg pain 11/14/2018  . Atherosclerosis of native arteries of the extremities with ulceration ([place]) 08/21/2018  . Chronic constipation 06/18/2018  . Erythema of breast 04/18/2018  . MRSA cellulitis 03/31/2018  . Advanced directives,  counseling/discussion 01/29/2018  . Bilateral hearing loss 01/29/2018  . Spinal stenosis of lumbosacral region 01/08/2018  . Systolic murmur 95/12/3265  . De Quervain's tenosynovitis, left 01/09/2017  . Encounter for chronic pain management 02/09/2016  . Paroxysmal atrial fibrillation (HCC)   . Varicose veins of left lower extremity with inflammation, with ulcer of calf limited to breakdown of skin ([place]) 05/22/2015  . Varicose veins of right lower extremity with inflammation 01/23/2015  . Diabetes mellitus type 2 with peripheral artery disease ([place])   . Chronic venous insufficiency   . Bleeding from varicose veins of lower extremity, left 02/13/2014  . Diabetic polyneuropathy ([place]) 11/19/2013  . Type 2 diabetes mellitus with neurological manifestations, controlled ([place]) 11/19/2013  . Right shoulder pain 08/19/2013  . Cerumen impaction 04/16/2013  . Abdominal pain 05/14/2012  . Peptic ulcer disease   . Unsteady gait 12/13/2011  . Chronic lower back pain 09/14/2011  . Hyperlipidemia associated with type 2 diabetes mellitus ([place]) 09/06/2010  . GAD (generalized anxiety disorder) 03/15/2010  . Obesity, Class I, BMI 30-34.9 03/15/2010  . Hypertension 03/15/2010  . (HFpEF) heart failure with preserved ejection fraction ([place]) 03/15/2010  . Coronary artery disease    Have you seen any other providers since your last visit? Yes  03/18/20- Dr. Alfonso Patten- Dermatology  Any changes in your medications or health? No  Any side effects from any medications? Curious if pravastatin is causing leg pain.  Do you have an symptoms or problems not managed by your medications? No  Any concerns about your health right now? Concerned about her leg pain.  Has your provider asked that you check blood pressure, blood sugar, or follow special diet at home? Checks blood sugar.  Do you get any type of exercise on a regular basis? Not very active.   Can you think of a goal you would like to reach for  your health? Not at this time.   Do you have any problems getting your medications? Yes. States Eliquis and Januvia are too expensive.   Is there anything that you would like to discuss during the appointment? Medication costs.   Patient reminded to have all medications and supplements available for review with [name], Pharm. D, at their telephone visit on 04/15/20  Follow-Up:  Pharmacist Review   [name], CPP notified  Margaretmary Dys, [place] Assistant [phone]

## 2020-04-10 NOTE — Telephone Encounter (Signed)
Needs to get this through psych.  If desires to establish with our office for anxiety, needs to schedule OV and review controlled substance agreement for this medicine through our office.

## 2020-04-10 NOTE — Telephone Encounter (Signed)
Patient has called back asking for xanax. Please advise as to what patient needs to do. Thank you, EM

## 2020-04-15 ENCOUNTER — Other Ambulatory Visit: Payer: Self-pay

## 2020-04-15 ENCOUNTER — Ambulatory Visit: Payer: Medicare Other

## 2020-04-15 DIAGNOSIS — E1149 Type 2 diabetes mellitus with other diabetic neurological complication: Secondary | ICD-10-CM

## 2020-04-15 DIAGNOSIS — E785 Hyperlipidemia, unspecified: Secondary | ICD-10-CM

## 2020-04-15 NOTE — Chronic Care Management (AMB) (Signed)
Chronic Care Management Pharmacy  Name: INGA NOLLER  MRN: [id number] DOB: 1927/06/27  Chief Complaint/ HPI  [name] Filter,  87 y.o. , female presents for their Initial CCM visit with the clinical pharmacist via telephone.  PCP : Ria Bush, MD  Their chronic conditions include: CAD, HTN, HFpEF, DM, AFIB, PUD, chronic constipation, HLD, CKD, GAD, obesity, chronic back pain, history of CVA  CCM consent 03/09/20  Office Visits: 02/26/20: PCP - Call to schedule eye appt, [facility], sent info to care coordinator for financial assistance 02/13/20: PCP - acute cystitis, cephalexin 7 day course  12/25/19: PCP - UTI symptoms, start cephalexin, Flu shot given, varicose veins with weeping RLE, routine care  11/05/19: PCP - doing well today, refill hydrocodone 1/2 tab BID, reviewed lasix dosing 40 mg BID  Consult Visit: 12/24/19: Endocrinology Dwyane Dee) - A1c 7.1% on Actos 15 mg and Januvia 100 mg, excellent control, no recent history of HF checking BG every other day, continue regimen same, call if BG > 200 consistently. Urinary urgency with discomfort.   Allergies  Allergen Reactions   Metformin And Related Diarrhea   Medications: Outpatient Encounter Medications as of 04/15/2020  Medication Sig   acetaminophen (TYLENOL) 500 MG tablet Take 500 mg by mouth every 6 (six) hours as needed for mild pain.   ALPRAZolam (XANAX) 1 MG tablet Take 0.5 tablets (0.5 mg total) by mouth 4 (four) times daily as needed for anxiety.   B Complex-C (B COMPLEX-VITAMIN C PO) Take 1 tablet by mouth daily. +zinc   diclofenac sodium (VOLTAREN) 1 % GEL Apply 1 application topically 3 (three) times daily.   DILT-XR 120 MG 24 hr capsule TAKE 1 CAPSULE BY MOUTH EVERY DAY   ELIQUIS 5 MG TABS tablet TAKE 1 TABLET BY MOUTH TWICE A DAY   famotidine (PEPCID) 20 MG tablet Take 1 tablet (20 mg total) by mouth daily as needed for heartburn (breakthrough heartburn).   fluticasone (FLONASE) 50  MCG/ACT nasal spray Place into both nostrils.   furosemide (LASIX) 40 MG tablet TAKE 1 TABLET BY MOUTH TWICE A DAY   glucosamine-chondroitin 500-400 MG tablet Take 1 tablet by mouth daily.   HYDROcodone-acetaminophen (NORCO/VICODIN) 5-325 MG tablet Take 0.5 tablets by mouth 2 (two) times daily as needed for moderate pain or severe pain.   Lancets (ONETOUCH DELICA PLUS [id number]) MISC Use to test blood sugar once daily dx code E11.9   loperamide (IMODIUM) 2 MG capsule Take 2 mg by mouth 4 (four) times daily as needed for diarrhea or loose stools.   loratadine (CLARITIN) 10 MG tablet Take 10 mg by mouth daily as needed for allergies.   metoprolol tartrate (LOPRESSOR) 50 MG tablet TAKE 1 TABLET BY MOUTH TWICE A DAY   Multiple Vitamins-Minerals (ICAPS) CAPS Take 1 capsule by mouth daily.   nitroGLYCERIN (NITROSTAT) 0.4 MG SL tablet Place 1 tablet (0.4 mg total) under the tongue every 5 (five) minutes as needed for chest pain.   nystatin ointment (MYCOSTATIN) Apply 1 application topically 2 (two) times daily.   nystatin-triamcinolone ointment (MYCOLOG) Apply to affected area twice a day as needed for itching   ONETOUCH VERIO test strip USE TO TEST BLOOD SUGAR ONCE DAILY. DX:E11.9   OXcarbazepine (TRILEPTAL) 150 MG tablet Take 150 mg by mouth daily. Every evening   pantoprazole (PROTONIX) 40 MG tablet TAKE 1 TABLET BY MOUTH TWICE A DAY   pioglitazone (ACTOS) 15 MG tablet TAKE 1 TABLET BY MOUTH EVERY DAY  polyethylene glycol powder (GLYCOLAX/MIRALAX) powder Take 17 g by mouth daily. Hold for diarrhea   potassium chloride (MICRO-K) 10 MEQ CR capsule TAKE 1 CAPSULE BY MOUTH EVERY DAY   pravastatin (PRAVACHOL) 20 MG tablet TAKE 1 TABLET BY MOUTH EVERY DAY   Probiotic Product (ALIGN) 4 MG CAPS Take 1 capsule (4 mg total) by mouth daily.   traZODone (DESYREL) 150 MG tablet Take 0.5 tablets (75 mg total) by mouth at bedtime.   vitamin E 1000 UNIT capsule Take 1,000 Units by mouth daily.    [DISCONTINUED] sitaGLIPtin (JANUVIA) 100 MG tablet TAKE 1/2 TABLET BY MOUTH EVERY DAY   No facility-administered encounter medications on file as of 04/15/2020.   Current Diagnosis/Assessment:  SDOH Interventions   Flowsheet Row Most Recent Value  SDOH Interventions   Financial Strain Interventions Other (Comment)  [Application completed for Eliquis and Januvia assistance]     Goals Addressed            This Visit's Progress    Pharmacy Care Plan       CARE PLAN ENTRY (see longitudinal plan of care for additional care plan information)  Current Barriers:   Chronic Disease Management support, education, and care coordination needs related to Hyperlipidemia and Diabetes  Hyperlipidemia Lab Results  Component Value Date/Time   LDLCALC 67 12/24/2019 03:03 PM    Pharmacist Clinical Goal(s): o Over the next 6 months, patient will work with PharmD and providers to maintain LDL goal < 70  Current regimen:  o Pravastatin 20 mg - 1 tablet daily  Interventions: o Reviewed adherence and tolerance  Patient self care activities - Over the next 6 months, patient will: o Continue current therapy  Diabetes Lab Results  Component Value Date/Time   HGBA1C 7.1 (A) 12/24/2019 02:43 PM   HGBA1C 7.4 (H) 09/02/2019 12:03 PM   HGBA1C 7.5 (A) 06/11/2019 02:01 PM   HGBA1C 6.9 (H) 02/08/2019 01:37 AM    Pharmacist Clinical Goal(s): o Over the next 6 months, patient will work with PharmD and providers to achieve A1c goal <7%  Current regimen:   Januvia 100 mg - 1/2 tablet daily   Actos 15 mg - 1 tablet daily   Interventions: o Recommend watching for symptoms of low blood glucose and appropriate treatment o Recommend checking BG once daily and documenting   Patient self care activities - Over the next 6 months, patient will: o Check blood sugar once daily, document, and provide at future appointments o Contact provider with any episodes of hypoglycemia  Medication  management  Pharmacist Clinical Goal(s): o Over the next 6 months, patient will work with PharmD and providers to achieve optimal medication adherence  [facility]  Interventions o Comprehensive medication review performed. o Utilize [facility] pharmacy for medication synchronization, packaging and delivery  Patient self care activities - Over the next 6 months, patient will: o Focus on medication adherence by utilizing adherence packaging o Take medications as prescribed o Report any questions or concerns to PharmD and/or provider(s)  Initial goal documentation      Hyperlipidemia   LDL goal < 70  Last lipids Lab Results  Component Value Date   CHOL 131 12/24/2019   HDL 50.50 12/24/2019   LDLCALC 67 12/24/2019   TRIG 71.0 12/24/2019   CHOLHDL 3 12/24/2019   Hepatic Function Latest Ref Rng & Units 02/19/2020 12/24/2019 09/10/2019  Total Protein 6.0 - 8.3 g/dL - 7.2 -  Albumin 3.5 - 5.2 g/dL 3.8 3.8  4.0  AST 0 - 37 U/L - 17 -  ALT 0 - 35 U/L - 11 -  Alk Phosphatase 39 - 117 U/L - 126(H) -  Total Bilirubin 0.2 - 1.2 mg/dL - 0.5 -  Bilirubin, Direct 0.0 - 0.2 mg/dL - - -    The ASCVD Risk score Mikey Bussing DC Jr., et al., 2013) failed to calculate for the following reasons:   The 2013 ASCVD risk score is only valid for ages 63 to 94   The patient has a prior MI or stroke diagnosis   Patient has failed these meds in past: none  Patient is currently controlled on the following medications:   Pravastatin 20 mg - 1 tablet daily  Reviewed adherence and tolerance - no concerns   Plan: Continue current medications  Diabetes   Followed by Arlester Marker  Recent Relevant Labs: Lab Results  Component Value Date/Time   HGBA1C 7.1 (A) 12/24/2019 02:43 PM   HGBA1C 7.4 (H) 09/02/2019 12:03 PM   HGBA1C 7.5 (A) 06/11/2019 02:01 PM   HGBA1C 6.9 (H) 02/08/2019 01:37 AM   MICROALBUR <0.7 02/19/2020 01:32 PM   MICROALBUR 5.0 (H) 12/24/2019 03:03  PM    A1c goal < 7% Checking BG: Patient unable to provide BG log today  Patient has failed these meds in past: Metformin - diarrhea /lack of benefit  Patient is currently on the following medications:   Januvia 100 mg - 1/2 tablet daily   Actos 15 mg - 1 tablet daily   Last diabetic foot exam: sees endocrinology   Lab Results  Component Value Date/Time   HMDIABEYEEXA No Retinopathy 06/12/2013 12:00 AM    We discussed: Actos reduced to 15 mg May 2021 per PCP and stopped 09/17/19 per Dwyane Dee, but pt never discontinued. Per Dwyane Dee 12/24/19, continue Januvia 100 mg 1/2 tablet daily and Actos 15 mg daily.   Adequate control for age, comorbidities. Patient was out of Actos today in office[facility]. She denies hypoglycemia.   Plan: Continue current medications; Complete patient assistance application for Januvia.  AFIB   Lab Results  Component Value Date   CREATININE 1.05 02/19/2020   BUN 28 (H) 02/19/2020   GFR 46.71 (L) 02/19/2020   GFRNONAA 49 (L) 09/05/2019   GFRAA 57 (L) 09/05/2019   NA 140 02/19/2020   K 4.2 02/19/2020   CALCIUM 9.5 02/19/2020   CO2 35 (H) 02/19/2020   Weight - 63 kg, Age 75, Scr 1.05 Patient is currently rate controlled.  Patient has failed these meds in past: none reported Patient is currently on the following medications:   Metoprolol 50 mg - 1 BID  Dilt-XR 120 mg 24 hr - 1 capsule daily  Eliquis 5 mg - 1 BID  Office blood pressures are: BP Readings from Last 3 Encounters:  02/26/20 116/62  02/13/20 135/74  12/25/19 124/64   Plan: Continue current medications; Complete patient assistance application for Eliquis.   GAD   Followed by [facility] Rozell Searing NP  Patient has failed these meds in past:  Patient is currently controlled on the following medications:   Xanax 1 mg - 1/2 tablet QID PRN  Trazodone 150 mg - 1/2 tablet qhs   We discussed: Patient reports desire to switch care to PCP for anxiety due to  difficulty scheduling appt at current clinic.  Plan: Continue current medications   PUD   Patient has failed these meds in past: none reported Patient is currently controlled on the  following medications:   Famotidine 20 mg - 1 daily  Pantoprazole 40 mg - 1 BID   Probiotic Align 4 mg capsule daily  We discussed: symptoms controlled   Plan: Continue current medications   Chronic Pain   Patient has failed these meds in past: none reported Patient is currently controlled on the following medications:   Hydrocodone-acetaminophen 5-325 mg - 1/2 tablet BID PRN  Glucosamine-chondroitin 500-400 MG tablet - 1 daily  Diclofenac gel 1 % - PRN  Tylenol 500 mg - PRN  We discussed: reports pain is well controlled, denies concerns  Plan: Continue current medications  Medication Management   Misc Meds:  Lasix 40 mg - 1 BID  Potassium chloride 10 mEq - 1 cap daily  Nystatin ointment   OTCs:  Vitamin E 1000 unit daily  MiraLAX  ICAPS Multivitamin   Loratadine 10 mg daily  Loperamide 2 mg - PRN   Vitamin B Complex   Flonase Nasal Spray  Discussed ICAPs, B-complex, and the vitamin E she has all contain vitamin E. The ICAPs also contains the B vitamins. Recommended she stop vitamin E and B-complex and continue ICAPs only.   Patient's preferred pharmacy is:  [facility] [address] [place] [address] Phone: [phone] Fax: [phone]  [id number] [place] [address]Phone: [phone]: [phone] Uses pill box? No - Keeps medications in Vials   Patient [facility]s except [facility]em [facility] Pt is confused about some of her medications and timing. She takes morning meds at 8 AM and evening meds at midnight.   We discussed: Discussed benefits of medication synchronization, packaging and delivery as well as enhanced  pharmacist oversight with [facility].   Created medication synchronization plan Patient will use adherence packaging in 30 day supplies once synced  Estimated sync date: 07/06/2020  Plan  Utilize [facility] pharmacy for medication synchronization, packaging and delivery  Follow up: 6 month phone visit; monthly adherence calls with BG and BP review   MDebbora Dus PharmD Clinical Pharmacist [phone]

## 2020-04-15 NOTE — Telephone Encounter (Deleted)
Atte 

## 2020-04-15 NOTE — Telephone Encounter (Signed)
Attempted to contact pt.  No answer.  Vm box not set up.

## 2020-04-16 ENCOUNTER — Other Ambulatory Visit: Payer: Self-pay

## 2020-04-16 ENCOUNTER — Telehealth: Payer: Self-pay | Admitting: Endocrinology

## 2020-04-16 MED ORDER — SITAGLIPTIN PHOSPHATE 100 MG PO TABS: ORAL_TABLET | ORAL | 5 refills | Status: DC

## 2020-04-16 NOTE — Telephone Encounter (Signed)
rx sent to [facility]

## 2020-04-16 NOTE — Telephone Encounter (Signed)
Spoke with pt relaying Dr. Synthia Innocent message.  Pt verbalizes understanding.  States she wants to establish with Dr. Darnell Level for anxiety.  Per pt, psych has c/x several appts with her.  Schedule OV on 04/28/19 at 2:30.

## 2020-04-16 NOTE — Telephone Encounter (Signed)
Lyndsey with [facility] called re: Patient is switching [facility]ARM ([facility] Lyndsey requests a new RX with refills for sitaGLIPtin (JANUVIA) 100 MG tablet be sent to [facility]-Fax# [phone] or send electronically

## 2020-04-22 ENCOUNTER — Other Ambulatory Visit: Payer: Self-pay

## 2020-04-22 ENCOUNTER — Encounter: Payer: Self-pay | Admitting: Dermatology

## 2020-04-22 ENCOUNTER — Ambulatory Visit (INDEPENDENT_AMBULATORY_CARE_PROVIDER_SITE_OTHER): Payer: Medicare Other | Admitting: Dermatology

## 2020-04-22 DIAGNOSIS — L570 Actinic keratosis: Secondary | ICD-10-CM | POA: Diagnosis not present

## 2020-04-22 DIAGNOSIS — L814 Other melanin hyperpigmentation: Secondary | ICD-10-CM

## 2020-04-22 DIAGNOSIS — C44619 Basal cell carcinoma of skin of left upper limb, including shoulder: Secondary | ICD-10-CM

## 2020-04-22 NOTE — Patient Instructions (Signed)

## 2020-04-22 NOTE — Progress Notes (Signed)
   Follow-Up Visit   Subjective  [name] is a 87 y.o. female who presents for the following: Follow-up (Patient here today to treat biopsy proven AK at right forearm and BCC at left forearm.  She also would like a spot checked at her nose.  Patient has a spot at nose she would like checked.   The following portions of the chart were reviewed this encounter and updated as appropriate:   Tobacco  Allergies  Meds  Problems  Med Hx  Surg Hx  Fam Hx      Review of Systems:  No other skin or systemic complaints except as noted in HPI or Assessment and Plan.  Objective  Well appearing patient in no apparent distress; mood and affect are within normal limits.  A focused examination was performed including arms, face. Relevant physical exam findings are noted in the Assessment and Plan.  Objective  Right Forearm: Healing biopsy site at right forearm, scaly pink papule at left nasal supra tip  Objective  Left Forearm: Scaly pink papule   Assessment & Plan  AK (actinic keratosis) Right Forearm  Will plan LN2 to left nasal supra tip at follow up, patient defers today.   Prior to procedure, discussed risks of blister formation, small wound, skin dyspigmentation, or rare scar following cryotherapy.    Destruction of lesion - Right Forearm Complexity: simple   Destruction method: cryotherapy   Informed consent: discussed and consent obtained   Lesion destroyed using liquid nitrogen: Yes   Cryotherapy cycles:  2 Outcome: patient tolerated procedure well with no complications   Post-procedure details: wound care instructions given    Basal cell carcinoma (BCC) of skin of left upper extremity including shoulder Left Forearm  Destruction of lesion  Destruction method: electrodesiccation and curettage   Informed consent: discussed and consent obtained   Timeout:  patient name, date of birth, surgical site, and procedure verified Patient was prepped and draped in  usual sterile fashion: area prepped with isopropyl alcohol. Anesthesia: the lesion was anesthetized in a standard fashion   Anesthetic:  1% lidocaine w/ epinephrine 1-100,000 buffered w/ 8.4% NaHCO3 Curettage performed in three different directions: Yes   Electrodesiccation performed over the curetted area: Yes   Curettage cycles:  3 Final wound size (cm):  0.8 Hemostasis achieved with:  electrodesiccation Outcome: patient tolerated procedure well with no complications   Post-procedure details: wound care instructions given   Additional details:  Mupirocin and a pressure dressing applied  Lentigines - Scattered tan macules - Discussed due to sun exposure - Benign, observe - Recommend daily broad spectrum sunscreen SPF 30+ to sun-exposed areas, reapply every 2 hours as needed. - Call for any changes  Return in about 2 months (around 06/20/2020) for TBSE, AK follow up.  Graciella Belton, RMA, am acting as scribe for Forest Gleason, MD .  Documentation: I have reviewed the above documentation for accuracy and completeness, and I agree with the above.  Forest Gleason, MD

## 2020-04-24 NOTE — Patient Instructions (Signed)
Dear [name],  It was a pleasure meeting you during our initial appointment on 04/15/2020. Below is a summary of the goals we discussed and components of chronic care management. Please contact me anytime with questions or concerns.   Visit Information  Goals Addressed            This [name] (see longitudinal plan of care for additional care plan information)  Current Barriers:   Chronic Disease Management support, education, and care coordination needs related to Hyperlipidemia and Diabetes  Hyperlipidemia Lab Results  Component Value Date/Time   LDLCALC 67 12/24/2019 03:03 PM    Pharmacist Clinical Goal(s): o Over the next 6 months, patient will work with PharmD and providers to maintain LDL goal < 70  Current regimen:  o Pravastatin 20 mg - 1 tablet daily  Interventions: o Reviewed adherence and tolerance  Patient self care activities - Over the next 6 months, patient will: o Continue current therapy  Diabetes Lab Results  Component Value Date/Time   HGBA1C 7.1 (A) 12/24/2019 02:43 PM   HGBA1C 7.4 (H) 09/02/2019 12:03 PM   HGBA1C 7.5 (A) 06/11/2019 02:01 PM   HGBA1C 6.9 (H) 02/08/2019 01:37 AM    Pharmacist Clinical Goal(s): o Over the next 6 months, patient will work with PharmD and providers to achieve A1c goal <7%  Current regimen:   Januvia 100 mg - 1/2 tablet daily   Actos 15 mg - 1 tablet daily   Interventions: o Recommend watching for symptoms of low blood glucose and appropriate treatment o Recommend checking BG once daily and documenting   Patient self care activities - Over the next 6 months, patient will: o Check blood sugar once daily, document, and provide at future appointments o Contact provider with any episodes of hypoglycemia  Medication management  Pharmacist Clinical Goal(s): o Over the next 6 months, patient will work with PharmD and providers to achieve optimal  medication adherence  Current pharmacy: [facility] [place]  Interventions o Comprehensive medication review performed. o Utilize [facility] for medication synchronization, packaging and delivery  Patient self care activities - Over the next 6 months, patient will: o Focus on medication adherence by utilizing adherence packaging o Take medications as prescribed o Report any questions or concerns to PharmD and/or provider(s)  Initial goal documentation       [name] was given information about Chronic Care Management services today including:  1. CCM service includes personalized support from designated clinical staff supervised by her physician, including individualized plan of care and coordination with other care providers 2. 24/7 contact phone numbers for assistance for urgent and routine care needs. 3. Standard insurance, coinsurance, copays and deductibles apply for chronic care management only during months in which we provide at least 20 minutes of these services. Most insurances cover these services at 100%, however patients may be responsible for any copay, coinsurance and/or deductible if applicable. This service may help you avoid the need for more expensive face-to-face services. 4. Only one practitioner may furnish and bill the service in a calendar month. 5. The patient may stop CCM services at any time (effective at the end of the month) by phone call to the office staff.  Patient agreed to services and verbal consent obtained.   The patient verbalized understanding of instructions, educational materials, and care plan provided today and agreed to receive a mailed copy  of patient instructions, educational materials, and care plan.    Verbal consent obtained for [facility] enhanced pharmacy services (medication synchronization, adherence packaging, delivery coordination). A medication sync plan was created to allow patient to get all  medications delivered once every 30 to 90 days per patient preference. Patient understands they have freedom to choose pharmacy and clinical pharmacist will coordinate care between all prescribers and [facility].  The pharmacy team will reach out to the patient again over the next 30 days.   [name], PharmD Clinical Pharmacist [facility] [phone]   Basics of Medicine Management Taking your medicines correctly is an important part of managing or preventing medical problems. Make sure you know what disease or condition your medicine is treating, and how and when to take it. If you do not take your medicine correctly, it may not work well and may cause unpleasant side effects, including serious health problems. What should I do when I am taking medicines?  Read all the labels and inserts that come with your medicines. Review the information often.  Talk with your pharmacist if you get a refill and notice a change in the size, color, or shape of your medicines.  Know the potential side effects for each medicine that you take.  Try to get all your medicines from the same pharmacy. The pharmacist will have all your information and will understand how your medicines will affect each other (interact).  Tell your health care provider about all your medicines, including over-the-counter medicines, vitamins, and herbal or dietary supplements. He or she will make sure that nothing will interact with any of your prescribed medicines.   How can I take my medicines safely?  Take medicines only as told by your health care provider. ? Do not take more of your medicine than instructed. ? Do not take anyone else's medicines. ? Do not share your medicines with others. ? Do not stop taking your medicines unless your health care provider tells you to do so. ? You may need to avoid alcohol or certain foods or liquids when taking certain medicines. Follow your health  care provider's instructions.  Do not split, mash, or chew your medicines unless your health care provider tells you to do so. Tell your health care provider if you have trouble swallowing your medicines.  For liquid medicine, use the dosing container that was provided. How should I organize my medicines? Know your medicines  Know what each of your medicines looks like. This includes size, color, and shape. Tell your health care provider if you are having trouble recognizing all the medicines that you are taking.  If you cannot tell your medicines apart because they look similar, keep them in original bottles.  If you cannot read the labels on the bottles, tell your pharmacist to put your medicines in containers with large print.  Review your medicines and your schedule with family members, a friend, or a caregiver. Use a pill organizer  Use a tool to organize your medicine schedule. Tools include a weekly pillbox, a written chart, a notebook, or a calendar.  Your tool should help you remember the following things about each medicine: ? The name of the medicine. ? The amount (dose) to take. ? The schedule. This is the day and time the medicine should be taken. ? The appearance. This includes color, shape, size, and stamp. ? How to take your medicines. This includes instructions to take them with food, without food,  with fluids, or with other medicines.  Create reminders for taking your medicines. Use sticky notes, or alarms on your watch, mobile device, or phone calendar.  You may choose to use a more advanced management system. These systems have storage, alarms, and visual and audio prompts.  Some medicines can be taken on an "as-needed" basis. These include medicines for nausea or pain. If you take an as-needed medicine, write down the name and dose, as well as the date and time that you took it.   How should I plan for travel?  Take your pillbox, medicines, and organization  system with you when traveling.  Have your medicines refilled before you travel. This will ensure that you do not run out of your medicines while you are away from home.  Always carry an updated list of your medicines with you. If there is an emergency, a first responder can quickly see what medicines you are taking.  Do not pack your medicines in checked luggage in case your luggage is lost or delayed.  If any of your medicines is considered a controlled substance, make sure you bring a letter from your health care provider with you. How should I store and discard my medicines? For safe storage:  Store medicines in a cool, dry area away from light, or as directed by your health care provider. Do not store medicines in the bathroom. Heat and humidity will affect them.  Do not store your medicines with other chemicals, or with medicines for pets or other household members.  Keep medicines away from children and pets. Do not leave them on counters or bedside tables. Store them in high cabinets or on high shelves. For safe disposal:  Check expiration dates regularly. Do not take expired medicines. Discard medicines that are older than the expiration date.  Learn a safe way to dispose of your medicines. You may: ? Use a local government, hospital, or pharmacy medicine-take-back program. ? Mix the medicines with inedible substances, put them in a sealed bag or empty container, and throw them in the trash. What should I remember?  Tell your health care provider if you: ? Experience side effects. ? Have new symptoms. ? Have other concerns about taking your medicines.  Review your medicines regularly with your health care provider. Other medicines, diet, medical conditions, weight changes, and daily habits can all affect how medicines work. Ask if you need to continue taking each medicine, and discuss how well each one is working.  Refill your medicines early to avoid running out of  them.  In case of an accidental overdose, call your local [place] at [phone] or visit your local emergency department immediately. This is important. Summary  Taking your medicines correctly is an important part of managing or preventing medical problems.  You need to make sure that you understand what you are taking a medicine for, as well as how and when you need to take it.  Know your medicines and use a pill organizer to help you take your medicines correctly.  In case of an accidental overdose, call your local [place] at [phone] or visit your local emergency department immediately. This is important. This information is not intended to replace advice given to you by your health care provider. Make sure you discuss any questions you have with your health care provider. Document Revised: 03/16/2017 Document Reviewed: 03/16/2017 Elsevier Patient Education  2021 [employer].

## 2020-04-24 NOTE — Progress Notes (Signed)
I have collaborated with the care management provider regarding care management and care coordination activities outlined in this encounter and have reviewed this encounter including documentation in the note and care plan. I am certifying that I agree with the content of this note and encounter as supervising physician.  

## 2020-04-27 ENCOUNTER — Ambulatory Visit: Payer: Medicare Other | Admitting: Endocrinology

## 2020-04-27 ENCOUNTER — Ambulatory Visit: Payer: Medicare Other | Admitting: Family Medicine

## 2020-04-27 ENCOUNTER — Ambulatory Visit (INDEPENDENT_AMBULATORY_CARE_PROVIDER_SITE_OTHER): Payer: Medicare Other | Admitting: Family Medicine

## 2020-04-27 ENCOUNTER — Other Ambulatory Visit: Payer: Self-pay

## 2020-04-27 ENCOUNTER — Encounter: Payer: Self-pay | Admitting: Family Medicine

## 2020-04-27 VITALS — BP 136/68 | HR 101 | Temp 97.5°F | Ht <= 58 in | Wt 136.5 lb

## 2020-04-27 DIAGNOSIS — M5442 Lumbago with sciatica, left side: Secondary | ICD-10-CM | POA: Diagnosis not present

## 2020-04-27 DIAGNOSIS — Z79899 Other long term (current) drug therapy: Secondary | ICD-10-CM

## 2020-04-27 DIAGNOSIS — M5441 Lumbago with sciatica, right side: Secondary | ICD-10-CM

## 2020-04-27 DIAGNOSIS — G8929 Other chronic pain: Secondary | ICD-10-CM

## 2020-04-27 DIAGNOSIS — F411 Generalized anxiety disorder: Secondary | ICD-10-CM

## 2020-04-27 DIAGNOSIS — Z599 Problem related to housing and economic circumstances, unspecified: Secondary | ICD-10-CM | POA: Diagnosis not present

## 2020-04-27 MED ORDER — HYDROCODONE-ACETAMINOPHEN 5-325 MG PO TABS: 0.5000 | ORAL_TABLET | Freq: Two times a day (BID) | ORAL | 0 refills | Status: DC | PRN

## 2020-04-27 MED ORDER — TRAZODONE HCL 150 MG PO TABS: 75.0000 mg | ORAL_TABLET | Freq: Every day | ORAL | 3 refills | Status: DC

## 2020-04-27 MED ORDER — ALPRAZOLAM 1 MG PO TABS
0.5000 mg | ORAL_TABLET | Freq: Four times a day (QID) | ORAL | 3 refills | Status: DC | PRN
Start: 2020-04-27 — End: 2020-08-21

## 2020-04-27 NOTE — Assessment & Plan Note (Signed)
Encouraged she use pill packs through pharmacy. She is hesitant.  Advised stop oxcarbazepine - as psych had recommended she stop this months ago.

## 2020-04-27 NOTE — Assessment & Plan Note (Addendum)
[place] CSRS reviewed. Update UDS, controlled substance agreement.  Tolerates hydrocodone well.

## 2020-04-27 NOTE — Assessment & Plan Note (Addendum)
Longstanding. Refill xanax and trazodone.  We will take over xanax medication. Reviewed expectations to receive from our office.  Update controlled substance agreement with both xanax and hydrocodone.  Consider trial SSRI or other in the future.

## 2020-04-27 NOTE — Progress Notes (Addendum)
[name], female    DOB: 09-21-1927, 87 y.o.   MRN: [id number]  This visit was conducted in person.  BP 136/68 (BP Location: Left Arm, Patient Position: Sitting, Cuff Size: Normal)   Pulse (!) 101   Temp (!) 97.5 F (36.4 C) (Temporal)   Ht 4' 7.5" (1.41 m)   Wt 136 lb 8 oz (61.9 kg)   SpO2 99%   BMI 31.16 kg/m    CC: anxiety  Subjective:   HPI: [name] is a 87 y.o. female presenting on 04/27/2020 for Anxiety (Here to establish for anxiety.  Pt accompanied by friend, Jerrye Beavers- temp 97.9.)   Would like our office to take over anxiety management.  Longstanding GAD previously managed by Noemi Chapel NP on long term xanax 1mg  1/2 tab QID PRN as well as trazodone 150mg  1/2 tab QHS. Previously on trileptal as well. Unsure if she's previously tried other anti-anxiety medication.   She has been out of xanax for 3-4 weeks - Rx was never refilled although she states she contacted psych office.   Our office already refills her hydrocodone 5/325mg  #30/month (normally lasts 2 months) for chronic lumbar back pain.   Polypharmacy with some medication confusion - being followed by PharmD [name] planning to uitlize [facility] to synchronize medications into adherence packing, 30d supply at a time. Estimate sync date 07/06/2020.      Relevant past medical, surgical, family and social history reviewed and updated as indicated. Interim medical history since our last visit reviewed. Allergies and medications reviewed and updated. Outpatient Medications Prior to Visit  Medication Sig Dispense Refill  . acetaminophen (TYLENOL) 500 MG tablet Take 500 mg by mouth every 6 (six) hours as needed for mild pain.    . B Complex-C (B COMPLEX-VITAMIN C PO) Take 1 tablet by mouth daily. +zinc    . diclofenac sodium (VOLTAREN) 1 % GEL Apply 1 application topically 3 (three) times daily. 1 Tube 1  . DILT-XR 120 MG 24 hr capsule TAKE 1 CAPSULE BY MOUTH EVERY DAY 30 capsule 6   . ELIQUIS 5 MG TABS tablet TAKE 1 TABLET BY MOUTH TWICE A DAY 60 tablet 5  . famotidine (PEPCID) 20 MG tablet Take 1 tablet (20 mg total) by mouth daily as needed for heartburn (breakthrough heartburn).    . fluticasone (FLONASE) 50 MCG/ACT nasal spray Place into both nostrils.    . furosemide (LASIX) 40 MG tablet TAKE 1 TABLET BY MOUTH TWICE A DAY 180 tablet 1  . glucosamine-chondroitin 500-400 MG tablet Take 1 tablet by mouth daily.    . Lancets (ONETOUCH DELICA PLUS IAXKPV37S) MISC Use to test blood sugar once daily dx code E11.9 100 each 1  . loperamide (IMODIUM) 2 MG capsule Take 2 mg by mouth 4 (four) times daily as needed for diarrhea or loose stools.    Marland Kitchen loratadine (CLARITIN) 10 MG tablet Take 10 mg by mouth daily as needed for allergies.    . metoprolol tartrate (LOPRESSOR) 50 MG tablet TAKE 1 TABLET BY MOUTH TWICE A DAY 180 tablet 3  . Multiple Vitamins-Minerals (ICAPS) CAPS Take 1 capsule by mouth daily.    . nitroGLYCERIN (NITROSTAT) 0.4 MG SL tablet Place 1 tablet (0.4 mg total) under the tongue every 5 (five) minutes as needed for chest pain. 25 tablet 3  . nystatin ointment (MYCOSTATIN) Apply 1 application topically 2 (two) times daily. 30 g 3  . nystatin-triamcinolone ointment (MYCOLOG) Apply to affected area  twice a day as needed for itching    . ONETOUCH VERIO test strip USE TO TEST BLOOD SUGAR ONCE DAILY. DX:E11.9 50 strip 11  . pantoprazole (PROTONIX) 40 MG tablet TAKE 1 TABLET BY MOUTH TWICE A DAY 180 tablet 3  . pioglitazone (ACTOS) 15 MG tablet TAKE 1 TABLET BY MOUTH EVERY DAY 30 tablet 6  . polyethylene glycol powder (GLYCOLAX/MIRALAX) powder Take 17 g by mouth daily. Hold for diarrhea 3350 g 1  . potassium chloride (MICRO-K) 10 MEQ CR capsule TAKE 1 CAPSULE BY MOUTH EVERY DAY 90 capsule 1  . pravastatin (PRAVACHOL) 20 MG tablet TAKE 1 TABLET BY MOUTH EVERY DAY 90 tablet 3  . Probiotic Product (ALIGN) 4 MG CAPS Take 1 capsule (4 mg total) by mouth daily.    .  sitaGLIPtin (JANUVIA) 100 MG tablet TAKE 1/2 TABLET BY MOUTH EVERY DAY 30 tablet 5  . vitamin E 1000 UNIT capsule Take 1,000 Units by mouth daily.    Marland Kitchen ALPRAZolam (XANAX) 1 MG tablet Take 0.5 tablets (0.5 mg total) by mouth 4 (four) times daily as needed for anxiety.    Marland Kitchen HYDROcodone-acetaminophen (NORCO/VICODIN) 5-325 MG tablet Take 0.5 tablets by mouth 2 (two) times daily as needed for moderate pain or severe pain. 30 tablet 0  . OXcarbazepine (TRILEPTAL) 150 MG tablet Take 150 mg by mouth daily. Every evening    . traZODone (DESYREL) 150 MG tablet Take 0.5 tablets (75 mg total) by mouth at bedtime.     No facility-administered medications prior to visit.     Per HPI unless specifically indicated in ROS section below Review of Systems Objective:  BP 136/68 (BP Location: Left Arm, Patient Position: Sitting, Cuff Size: Normal)   Pulse (!) 101   Temp (!) 97.5 F (36.4 C) (Temporal)   Ht 4' 7.5" (1.41 m)   Wt 136 lb 8 oz (61.9 kg)   SpO2 99%   BMI 31.16 kg/m   Wt Readings from Last 3 Encounters:  04/27/20 136 lb 8 oz (61.9 kg)  02/26/20 139 lb 2 oz (63.1 kg)  12/25/19 139 lb 8 oz (63.3 kg)      Physical Exam Vitals and nursing note reviewed.  Constitutional:      Appearance: Normal appearance. She is not ill-appearing.     Comments: Ambulates with walker  Cardiovascular:     Rate and Rhythm: Tachycardia present. Rhythm irregular.     Pulses: Normal pulses.     Heart sounds: Normal heart sounds. No murmur heard.   Pulmonary:     Effort: Pulmonary effort is normal. No respiratory distress.     Breath sounds: Normal breath sounds. No wheezing, rhonchi or rales.  Musculoskeletal:     Right lower leg: No edema.     Left lower leg: No edema.  Skin:    General: Skin is warm and dry.     Findings: No rash.  Neurological:     Mental Status: She is alert.  Psychiatric:        Attention and Perception: Attention normal.        Mood and Affect: Mood is anxious and depressed.        Depression screen [facility] 2/9 04/27/2020 02/12/2020 02/27/2019 02/05/2019 01/24/2018  Decreased Interest 1 2 3 3  0  Down, Depressed, Hopeless 3 2 3 3  0  PHQ - 2 Score 4 4 6 6  0  Altered sleeping 3 0 3 3 0  Tired, decreased energy 2 0 3 3 0  Change  in appetite 3 0 0 0 0  Feeling bad or failure about yourself  2 0 0 0 0  Trouble concentrating 0 0 0 0 0  Moving slowly or fidgety/restless 1 0 0 0 0  Suicidal thoughts 0 0 0 0 0  PHQ-9 Score 15 4 12 12  0  Difficult doing work/chores - Somewhat difficult - Very difficult Not difficult at all  Some recent data might be hidden    GAD 7 : Generalized Anxiety Score 04/27/2020 09/11/2019  Nervous, Anxious, on Edge 3 0  Control/stop worrying 3 0  Worry too much - different things 3 0  Trouble relaxing 3 0  Restless 3 0  Easily annoyed or irritable 3 0  Afraid - awful might happen 3 0  Total GAD 7 Score 21 0  Anxiety Difficulty - Not difficult at all   Assessment & Plan:  This visit occurred during the SARS-CoV-2 public health emergency.  Safety protocols were in place, including screening questions prior to the visit, additional usage of staff PPE, and extensive cleaning of exam room while observing appropriate contact time as indicated for disinfecting solutions.   Problem List Items Addressed This Visit    Polypharmacy    Encouraged she use pill packs through pharmacy. She is hesitant.  Advised stop oxcarbazepine - as psych had recommended she stop this months ago.      GAD (generalized anxiety disorder) - Primary    Longstanding. Refill xanax and trazodone.  We will take over xanax medication. Reviewed expectations to receive from our office.  Update controlled substance agreement with both xanax and hydrocodone.  Consider trial SSRI or other in the future.      Relevant Medications   traZODone (DESYREL) 150 MG tablet   ALPRAZolam (XANAX) 1 MG tablet   Other Relevant Orders   DRUG MONITORING, PANEL 8 WITH CONFIRMATION, URINE    Financial difficulty    Encouraged she fill out medicare medication assistance application she brings today. If difficulty with it, she is to bring in to next visit to review.       Encounter for chronic pain management    [place] CSRS reviewed. Update UDS, controlled substance agreement.  Tolerates hydrocodone well.       Relevant Orders   DRUG MONITORING, PANEL 8 WITH CONFIRMATION, URINE   Chronic lower back pain   Relevant Medications   HYDROcodone-acetaminophen (NORCO/VICODIN) 5-325 MG tablet   Other Relevant Orders   DRUG MONITORING, PANEL 8 WITH CONFIRMATION, URINE       Meds ordered this encounter  Medications  . traZODone (DESYREL) 150 MG tablet    Sig: Take 0.5 tablets (75 mg total) by mouth at bedtime.    Dispense:  45 tablet    Refill:  3  . ALPRAZolam (XANAX) 1 MG tablet    Sig: Take 0.5 tablets (0.5 mg total) by mouth 4 (four) times daily as needed for anxiety.    Dispense:  30 tablet    Refill:  3    Note new prescriber  . HYDROcodone-acetaminophen (NORCO/VICODIN) 5-325 MG tablet    Sig: Take 0.5 tablets by mouth 2 (two) times daily as needed for moderate pain or severe pain.    Dispense:  30 tablet    Refill:  0   Orders Placed This Encounter  Procedures  . DRUG MONITORING, PANEL 8 WITH CONFIRMATION, URINE    Alprazolam, hydrodocone    Patient Instructions  We will take over xanax and trazodone prescriptions. Update urine screen  as well as controlled substance agreement (for hydrocodone and xanax).  Return as needed or in 3 months for chronic pain visit.  Stop trileptal (oxcarbazepine) Fill out medicare assistance application as best you can. If you need help, bring in completed form to next visit and we can review. If you complete on your own, send back to Medicare.  Return as needed or in 1 month for follow up visit.   Follow up plan: Return in about 4 weeks (around 05/25/2020), or if symptoms worsen or fail to improve, for follow up visit.  Ria Bush, MD

## 2020-04-27 NOTE — Patient Instructions (Addendum)
We will take over xanax and trazodone prescriptions. Update urine screen as well as controlled substance agreement (for hydrocodone and xanax).  Return as needed or in 3 months for chronic pain visit.  Stop trileptal (oxcarbazepine) Fill out medicare assistance application as best you can. If you need help, bring in completed form to next visit and we can review. If you complete on your own, send back to Medicare.  Return as needed or in 1 month for follow up visit.

## 2020-04-27 NOTE — Addendum Note (Signed)
Addended by: Brenton Grills on: 08/02/1482 03:97 PM   Modules accepted: Orders

## 2020-04-27 NOTE — Assessment & Plan Note (Signed)
Encouraged she fill out medicare medication assistance application she brings today. If difficulty with it, she is to bring in to next visit to review.

## 2020-04-28 ENCOUNTER — Telehealth: Payer: Self-pay

## 2020-04-28 NOTE — Chronic Care Management (AMB) (Addendum)
Chronic Care Management Pharmacy Assistant   Name: [name]  MRN: [id number] DOB: Aug 30, 1927  Reason for Encounter: Medication Review   PCP : Ria Bush, MD  Allergies:   Allergies  Allergen Reactions   Metformin And Related Diarrhea    Medications: Outpatient Encounter Medications as of 04/28/2020  Medication Sig   acetaminophen (TYLENOL) 500 MG tablet Take 500 mg by mouth every 6 (six) hours as needed for mild pain.   ALPRAZolam (XANAX) 1 MG tablet Take 0.5 tablets (0.5 mg total) by mouth 4 (four) times daily as needed for anxiety.   B Complex-C (B COMPLEX-VITAMIN C PO) Take 1 tablet by mouth daily. +zinc   diclofenac sodium (VOLTAREN) 1 % GEL Apply 1 application topically 3 (three) times daily.   DILT-XR 120 MG 24 hr capsule TAKE 1 CAPSULE BY MOUTH EVERY DAY   ELIQUIS 5 MG TABS tablet TAKE 1 TABLET BY MOUTH TWICE A DAY   famotidine (PEPCID) 20 MG tablet Take 1 tablet (20 mg total) by mouth daily as needed for heartburn (breakthrough heartburn).   fluticasone (FLONASE) 50 MCG/ACT nasal spray Place into both nostrils.   furosemide (LASIX) 40 MG tablet TAKE 1 TABLET BY MOUTH TWICE A DAY   glucosamine-chondroitin 500-400 MG tablet Take 1 tablet by mouth daily.   HYDROcodone-acetaminophen (NORCO/VICODIN) 5-325 MG tablet Take 0.5 tablets by mouth 2 (two) times daily as needed for moderate pain or severe pain.   Lancets (ONETOUCH DELICA PLUS [id number]) MISC Use to test blood sugar once daily dx code E11.9   loperamide (IMODIUM) 2 MG capsule Take 2 mg by mouth 4 (four) times daily as needed for diarrhea or loose stools.   loratadine (CLARITIN) 10 MG tablet Take 10 mg by mouth daily as needed for allergies.   metoprolol tartrate (LOPRESSOR) 50 MG tablet TAKE 1 TABLET BY MOUTH TWICE A DAY   Multiple Vitamins-Minerals (ICAPS) CAPS Take 1 capsule by mouth daily.   nitroGLYCERIN (NITROSTAT) 0.4 MG SL tablet Place 1 tablet (0.4 mg total) under the tongue every 5 (five)  minutes as needed for chest pain.   nystatin ointment (MYCOSTATIN) Apply 1 application topically 2 (two) times daily.   nystatin-triamcinolone ointment (MYCOLOG) Apply to affected area twice a day as needed for itching   ONETOUCH VERIO test strip USE TO TEST BLOOD SUGAR ONCE DAILY. DX:E11.9   pantoprazole (PROTONIX) 40 MG tablet TAKE 1 TABLET BY MOUTH TWICE A DAY   pioglitazone (ACTOS) 15 MG tablet TAKE 1 TABLET BY MOUTH EVERY DAY   polyethylene glycol powder (GLYCOLAX/MIRALAX) powder Take 17 g by mouth daily. Hold for diarrhea   potassium chloride (MICRO-K) 10 MEQ CR capsule TAKE 1 CAPSULE BY MOUTH EVERY DAY   pravastatin (PRAVACHOL) 20 MG tablet TAKE 1 TABLET BY MOUTH EVERY DAY   Probiotic Product (ALIGN) 4 MG CAPS Take 1 capsule (4 mg total) by mouth daily.   sitaGLIPtin (JANUVIA) 100 MG tablet TAKE 1/2 TABLET BY MOUTH EVERY DAY   traZODone (DESYREL) 150 MG tablet Take 0.5 tablets (75 mg total) by mouth at bedtime.   vitamin E 1000 UNIT capsule Take 1,000 Units by mouth daily.   No facility-administered encounter medications on file as of 04/28/2020.    Current Diagnosis: Patient Active Problem List   Diagnosis Date Noted   Financial difficulty 02/26/2020   UTI (urinary tract infection) 10/01/2019   CKD stage 3 due to type 2 diabetes mellitus ([place]) 09/02/2019   History of CVA in adulthood 08/23/2019  Lethargy 07/27/2019   Polypharmacy 07/27/2019   Pneumonia 02/15/2019   E coli bacteremia 02/13/2019   Dyspnea    Diverticulitis of duodenum    Duodenal ulcer perforation ([place]) 02/08/2019   Chronic leg pain 11/14/2018   Atherosclerosis of native arteries of the extremities with ulceration ([place]) 08/21/2018   Chronic constipation 06/18/2018   Erythema of breast 04/18/2018   MRSA cellulitis 03/31/2018   Advanced directives, counseling/discussion 01/29/2018   Bilateral hearing loss 01/29/2018   Spinal stenosis of lumbosacral region 33/54/5625   Systolic murmur 63/89/3734   De  Quervain's tenosynovitis, left 01/09/2017   Encounter for chronic pain management 02/09/2016   Paroxysmal atrial fibrillation (HCC)    Varicose veins of left lower extremity with inflammation, with ulcer of calf limited to breakdown of skin ([place]) 05/22/2015   Varicose veins of right lower extremity with inflammation 01/23/2015   Diabetes mellitus type 2 with peripheral artery disease (HCC)    Chronic venous insufficiency    Bleeding from varicose veins of lower extremity, left 02/13/2014   Diabetic polyneuropathy ([place]) 11/19/2013   Type 2 diabetes mellitus with neurological manifestations, controlled ([place]) 11/19/2013   Right shoulder pain 08/19/2013   Cerumen impaction 04/16/2013   Abdominal pain 05/14/2012   Peptic ulcer disease    Unsteady gait 12/13/2011   Chronic lower back pain 09/14/2011   Hyperlipidemia associated with type 2 diabetes mellitus ([place]) 09/06/2010   GAD (generalized anxiety disorder) 03/15/2010   Obesity, Class I, BMI 30-34.9 03/15/2010   Hypertension 03/15/2010   (HFpEF) heart failure with preserved ejection fraction ([place]) 03/15/2010   Coronary artery disease    Contacted patient regarding upcoming medication delivery from [facility]. She seemed very confused about her medications and pharmacy and asked me to call Dr. Danise Mina to ask him what to do. Explained to patient her pharmacy is a personal choice that he would likely defer back to her. She mentioned that she was told Medicare would not cover her medications from [facility] but she called and was told they would.  She then explained that her partner did not want anyone coming to their house for delivery. Discussed with [name] and due to patient confusion, she thinks its best to [facility] her medication [facility] to [facility] so patient can resume her usual routine. Explained to patient that [facility] would transfer all of her medications back to [facility]. Patient did not seem happy about this but agreed. Advised patient to call back  with any questions or concerns.   Follow-Up:  Pharmacist Review   [name], CPP notified  Margaretmary Dys, [place] [phone]  Patient presented to office with her friend for CCM this month and agreed to have medications transferred to [facility] for medication sync and adherence packaging, however, patient has been repeatedly confused about the process and unable to provide confirmation she would like to have medications delivered. Unfortunately, because of this, we will have to transfer [facility] back to [facility] and allow patient to pick up her medications as before.  [name], PharmD Clinical Pharmacist [facility] [phone]

## 2020-04-29 LAB — DRUG MONITORING, PANEL 8 WITH CONFIRMATION, URINE
6 Acetylmorphine: NEGATIVE ng/mL (ref <10)
Alcohol Metabolites: NEGATIVE ng/mL
Amphetamines: NEGATIVE ng/mL (ref <500)
Benzodiazepines: NEGATIVE ng/mL (ref <100)
Buprenorphine, Urine: NEGATIVE ng/mL (ref <5)
Cocaine Metabolite: NEGATIVE ng/mL (ref <150)
Codeine: NEGATIVE ng/mL (ref <50)
Creatinine: 44.7 mg/dL
Hydrocodone: 454 ng/mL — ABNORMAL HIGH (ref <50)
Hydromorphone: 62 ng/mL — ABNORMAL HIGH (ref <50)
MDMA: NEGATIVE ng/mL (ref <500)
Marijuana Metabolite: NEGATIVE ng/mL (ref <20)
Morphine: NEGATIVE ng/mL (ref <50)
Norhydrocodone: 305 ng/mL — ABNORMAL HIGH (ref <50)
Opiates: POSITIVE ng/mL — AB (ref <100)
Oxidant: NEGATIVE ug/mL
Oxycodone: NEGATIVE ng/mL (ref <100)
pH: 5.3 (ref 4.5–9.0)

## 2020-04-29 LAB — DM TEMPLATE

## 2020-05-07 ENCOUNTER — Other Ambulatory Visit: Payer: Self-pay

## 2020-05-07 ENCOUNTER — Ambulatory Visit (INDEPENDENT_AMBULATORY_CARE_PROVIDER_SITE_OTHER): Payer: Medicare Other | Admitting: Endocrinology

## 2020-05-07 ENCOUNTER — Encounter: Payer: Self-pay | Admitting: Endocrinology

## 2020-05-07 VITALS — BP 132/68 | HR 62 | Ht <= 58 in | Wt 137.6 lb

## 2020-05-07 DIAGNOSIS — E1165 Type 2 diabetes mellitus with hyperglycemia: Secondary | ICD-10-CM | POA: Diagnosis not present

## 2020-05-07 LAB — POCT GLYCOSYLATED HEMOGLOBIN (HGB A1C): Hemoglobin A1C: 7.3 % — AB (ref 4.0–5.6)

## 2020-05-07 NOTE — Progress Notes (Signed)
[name], female   DOB: [birth year], 87 y.o.   MRN: [id number]   Reason for Appointment: Diabetes follow-up   History of Present Illness   Diagnosis: Type 2 DIABETES MELITUS, date of diagnosis: 1991   She has had mild diabetes for several years and has been on Actos only since about 2001 Her A1c has generally been around or under 7% She was given a trial of metformin because of her difficulty with weight loss and also edema but she felt that it made her blood sugars go up and she will not try it again Also she thinks her blood sugars were higher with a trial of Januvia  RECENT history:  A1c is 7.3, previously 7.1  Oral hypoglycemic drugs: Actos 15 mg daily, Januvia 100 mg  Blood sugar readings were reviewed from patient meter download and history is as follows:  Difficult to communicate with the patient because of her hearing difficulty   She has checked her sugars about once a day or more  Most of her blood sugars appear to be relatively high after dinner  Also may have occasional high readings right after lunch  These are likely to be from drinking sweet tea, regular soft drinks and sometimes juices  However blood sugars have been as low as 92 late afternoon  She is taking her pioglitazone and Januvia as directed  Her appetite is inconsistent  She has lost a [place] weight          Side effects from medications: None  Monitors blood glucose: ?  Frequency .    Glucometer: One Touch.           Blood Glucose readings at home from meter download and averages:  Currently she has 76% of blood sugars in target and 24% above 180  PRE-MEAL Fasting Lunch Dinner Bedtime Overall  Glucose range:      92-236  Mean/median: ?   122   143+/-39   POST-MEAL PC Breakfast PC Lunch PC Dinner  Glucose range:     Mean/median:   217  195       Previous AVERAGE 136, blood sugar range 101-201  Mid afternoon blood sugars 108-144 Late afternoon  101-201 Late evening 163-176      Meals: 2  meals per day usually  Physical activity:  minimal            Dietician visit: Most recent: Several years ago             Wt Readings from Last 3 Encounters:  05/07/20 137 lb 9.6 oz (62.4 kg)  04/27/20 136 lb 8 oz (61.9 kg)  02/26/20 139 lb 2 oz (63.1 kg)   Lab Results  Component Value Date   HGBA1C 7.3 (A) 05/07/2020   HGBA1C 7.1 (A) 12/24/2019   HGBA1C 7.4 (H) 09/02/2019   Lab Results  Component Value Date   MICROALBUR <0.7 02/19/2020   LDLCALC 67 12/24/2019   CREATININE 1.05 02/19/2020     Allergies as of 05/07/2020      Reactions   Metformin And Related Diarrhea      Medication List       Accurate as of May 07, 2020  8:45 PM. If you have any questions, ask your nurse or doctor.        acetaminophen 500 MG tablet Commonly known as: TYLENOL Take 500 mg by mouth every 6 (six) hours as needed for mild pain.   Align 4 MG Caps  Take 1 capsule (4 mg total) by mouth daily.   ALPRAZolam 1 MG tablet Commonly known as: XANAX Take 0.5 tablets (0.5 mg total) by mouth 4 (four) times daily as needed for anxiety.   B COMPLEX-VITAMIN C PO Take 1 tablet by mouth daily. +zinc   diclofenac sodium 1 % Gel Commonly known as: VOLTAREN Apply 1 application topically 3 (three) times daily.   Dilt-XR 120 MG 24 hr capsule Generic drug: diltiazem TAKE 1 CAPSULE BY MOUTH EVERY DAY   Eliquis 5 MG Tabs tablet Generic drug: apixaban TAKE 1 TABLET BY MOUTH TWICE A DAY   famotidine 20 MG tablet Commonly known as: Pepcid Take 1 tablet (20 mg total) by mouth daily as needed for heartburn (breakthrough heartburn).   fluticasone 50 MCG/ACT nasal spray Commonly known as: FLONASE Place into both nostrils.   furosemide 40 MG tablet Commonly known as: LASIX TAKE 1 TABLET BY MOUTH TWICE A DAY   glucosamine-chondroitin 500-400 MG tablet Take 1 tablet by mouth daily.   HYDROcodone-acetaminophen 5-325 MG tablet Commonly known as:  NORCO/VICODIN Take 0.5 tablets by mouth 2 (two) times daily as needed for moderate pain or severe pain.   ICaps Caps Take 1 capsule by mouth daily.   loperamide 2 MG capsule Commonly known as: IMODIUM Take 2 mg by mouth 4 (four) times daily as needed for diarrhea or loose stools.   loratadine 10 MG tablet Commonly known as: CLARITIN Take 10 mg by mouth daily as needed for allergies.   metoprolol tartrate 50 MG tablet Commonly known as: LOPRESSOR TAKE 1 TABLET BY MOUTH TWICE A DAY   nitroGLYCERIN 0.4 MG SL tablet Commonly known as: NITROSTAT Place 1 tablet (0.4 mg total) under the tongue every 5 (five) minutes as needed for chest pain.   nystatin ointment Commonly known as: MYCOSTATIN Apply 1 application topically 2 (two) times daily.   nystatin-triamcinolone ointment Commonly known as: MYCOLOG Apply to affected area twice a day as needed for itching   OneTouch Delica Plus [id number] Misc Use to test blood sugar once daily dx code E11.9   OneTouch Verio test strip Generic drug: glucose blood USE TO TEST BLOOD SUGAR ONCE DAILY. DX:E11.9   pantoprazole 40 MG tablet Commonly known as: PROTONIX TAKE 1 TABLET BY MOUTH TWICE A DAY   pioglitazone 15 MG tablet Commonly known as: ACTOS TAKE 1 TABLET BY MOUTH EVERY DAY   polyethylene glycol powder 17 GM/SCOOP powder Commonly known as: GLYCOLAX/MIRALAX Take 17 g by mouth daily. Hold for diarrhea   potassium chloride 10 MEQ CR capsule Commonly known as: MICRO-K TAKE 1 CAPSULE BY MOUTH EVERY DAY   pravastatin 20 MG tablet Commonly known as: PRAVACHOL TAKE 1 TABLET BY MOUTH EVERY DAY   sitaGLIPtin 100 MG tablet Commonly known as: Januvia TAKE 1/2 TABLET BY MOUTH EVERY DAY   traZODone 150 MG tablet Commonly known as: DESYREL Take 0.5 tablets (75 mg total) by mouth at bedtime.   vitamin E 1000 UNIT capsule Take 1,000 Units by mouth daily.       Allergies:  Allergies  Allergen Reactions  . Metformin And Related  Diarrhea    Past Medical History:  Diagnosis Date  . Actinic keratosis 01/22/2020   right forearm  . Acute deep vein thrombosis (DVT) of distal vein of right lower extremity ([place]) 12/2013   Acute thrombus of R proximal gastrocnemius vein. Symptomatic provoked distal DVT  . Anxiety   . Basal cell carcinoma 10/30/2018   R nasal bridge, treated with  Moh's  . Basal cell carcinoma 01/22/2020   left forearm, nodular  . Cellulitis of leg, left 01/16/2014  . CHF (congestive heart failure) ([place])   . Chronic back pain 03/2012   lumbar DDD with herniation - s/p L S1 transforaminal ESI (10/2012 Dr. Sharlet Salina)  . Chronic venous insufficiency   . Coronary artery disease    CABG 1998  . Cystocele   . Depression   . Diabetes mellitus    type 2, followed by endo.  . Diastolic dysfunction   . Diverticulosis   . Hemorrhoids   . History of heart attack   . Hx of echocardiogram    a. Echo 10/13: mild LVH, EF 55-60%, Gr 1 diast dysfn, PASP 32  . Hyperlipidemia   . Hypertension   . Irritable bowel syndrome   . Microcytic anemia   . PAD (peripheral artery disease) ([place]) 2013   ABI: R 0.91, L 0.83  . Postmenopausal osteoporosis 01/2019   T score of -2.6  . PUD (peptic ulcer disease)   . Varicose veins   . Venous ulcer of leg ([place]) 12/12/2013   Required prolonged treatment by wound center  . Viral gastroenteritis due to [place] virus 05/13/2015   hospitalization    Past Surgical History:  Procedure Laterality Date  . ABDOMINAL HYSTERECTOMY  1975   TAH,BSO  . ABI  2013   R 0.91, L 0.83  . APPENDECTOMY     per pt report  . CARDIOVASCULAR STRESS TEST  2015   low risk myoview ([place])  . CATARACT SURGERY    . CHOLECYSTECTOMY    . COLONOSCOPY  02/2010   extensive diverticulosis throughout, small internal hemorrhoids, rec rpt 5 yrs (Dr. Allyn Kenner)  . CORONARY ARTERY BYPASS GRAFT  1998  . dexa scan  09/2011   femur -0.8, forearm 0.6 => normal  . ENDOVENOUS ABLATION SAPHENOUS VEIN W/ LASER Left  03/2014   Kellie Simmering  . ESI  2014   L S1 transforaminal ESI x2 ([place]) without improvement  . HAND SURGERY    . lexiscan myoview  03/2011   Small basal inferolateral and anterolateral reversible perfusion defect suggests ischemia.  EF was normal.   old infarct, no new ischemia  . LOWER EXTREMITY ANGIOGRAPHY Right 09/06/2018   Procedure: LOWER EXTREMITY ANGIOGRAPHY;  Surgeon: Algernon Huxley, MD;  Location: [facility];  Service: Cardiovascular;  Laterality: Right;  . toe surgery    . TYMPANOPLASTY  1960s   R side  . Vault prolapse A & P repair  2009   Dr Rhodia Albright [facility]    Family History  Problem Relation Age of Onset  . Hypertension Mother   . Stroke Mother   . Diabetes Father   . Diabetes Sister   . Diabetes Brother   . Heart disease Son   . Colon cancer Neg Hx     Social History:  reports that she quit smoking about 41 years ago. She has never used smokeless tobacco. She reports that she does not drink alcohol and does not use drugs.  Review of Systems:  She has history of leg edema, more on the right side Has been prescribed Lasix 40 mg twice daily  HYPERTENSION: Controlled,  She is on metoprolol and diltiazem   BP Readings from Last 3 Encounters:  05/07/20 132/68  04/27/20 136/68  02/26/20 116/62    HYPERLIPIDEMIA: High LDL has been treated with pravastatin 20 mg  This is followed by cardiologist and PCP Last LDL below 70  Lab Results  Component Value Date   CHOL 131 12/24/2019   HDL 50.50 12/24/2019   LDLCALC 67 12/24/2019   TRIG 71.0 12/24/2019   CHOLHDL 3 12/24/2019    Last foot exam in 6/21  She is asking about ear pain and throat discomfort     Examination:   BP 132/68   Pulse 62   Ht 4\' 8"  (1.422 m)   Wt 137 lb 9.6 oz (62.4 kg)   SpO2 96%   BMI 30.85 kg/m   Body mass index is 30.85 kg/m.   Exam not indicated   ASSESSMENT/ PLAN:   DIABETES type 2 with obesity  A1c is 7.3 compared to 7.1  She is on Actos, 15 mg and  Januvia 100 mg  She does have some higher postprandial readings that are likely related to drinking drinks with sugar Otherwise she does not eat large portions or carbohydrates Again for her age her level of control is quite adequate  No edema with 15 mg of Actos with no recent history of heart failure or unusual weight gain Discussed trying to cut back or eliminate sweet tea and regular soft drinks and switch to unsweetened products as well as limit amounts of juice  She will be continuing the same regimen To call if she has any persistently high readings  She will follow up with PCP regarding other questions and concerns that she has  Hypertension: Well-controlled   Follow-up in 6 months    Patient Instructions  Switch to unsweet tea and cokes       Elayne Snare 05/07/2020, 8:45 PM      Note: This office note was prepared with Dragon voice recognition system technology. Any transcriptional errors that result from this process are unintentional.

## 2020-05-07 NOTE — Patient Instructions (Signed)
Switch to unsweet tea and cokes

## 2020-05-14 DIAGNOSIS — E113393 Type 2 diabetes mellitus with moderate nonproliferative diabetic retinopathy without macular edema, bilateral: Secondary | ICD-10-CM | POA: Diagnosis not present

## 2020-05-22 ENCOUNTER — Telehealth: Payer: Self-pay | Admitting: Cardiovascular Disease

## 2020-05-22 NOTE — Telephone Encounter (Signed)
Encounter not needed

## 2020-05-27 ENCOUNTER — Encounter: Payer: Self-pay | Admitting: Family Medicine

## 2020-05-27 ENCOUNTER — Ambulatory Visit (INDEPENDENT_AMBULATORY_CARE_PROVIDER_SITE_OTHER): Payer: Medicare Other | Admitting: Family Medicine

## 2020-05-27 ENCOUNTER — Other Ambulatory Visit: Payer: Self-pay

## 2020-05-27 VITALS — BP 116/68 | HR 60 | Temp 98.1°F | Ht <= 58 in | Wt 139.3 lb

## 2020-05-27 DIAGNOSIS — E1149 Type 2 diabetes mellitus with other diabetic neurological complication: Secondary | ICD-10-CM | POA: Diagnosis not present

## 2020-05-27 DIAGNOSIS — F411 Generalized anxiety disorder: Secondary | ICD-10-CM

## 2020-05-27 DIAGNOSIS — I10 Essential (primary) hypertension: Secondary | ICD-10-CM | POA: Diagnosis not present

## 2020-05-27 DIAGNOSIS — Z79899 Other long term (current) drug therapy: Secondary | ICD-10-CM | POA: Diagnosis not present

## 2020-05-27 DIAGNOSIS — G8929 Other chronic pain: Secondary | ICD-10-CM

## 2020-05-27 DIAGNOSIS — E1151 Type 2 diabetes mellitus with diabetic peripheral angiopathy without gangrene: Secondary | ICD-10-CM

## 2020-05-27 DIAGNOSIS — H9193 Unspecified hearing loss, bilateral: Secondary | ICD-10-CM

## 2020-05-27 MED ORDER — PRAVASTATIN SODIUM 20 MG PO TABS: 20.0000 mg | ORAL_TABLET | Freq: Every day | ORAL | 3 refills | Status: AC

## 2020-05-27 MED ORDER — HYDROCODONE-ACETAMINOPHEN 5-325 MG PO TABS: 0.5000 | ORAL_TABLET | Freq: Two times a day (BID) | ORAL | 0 refills | Status: DC | PRN

## 2020-05-27 NOTE — Progress Notes (Signed)
[name], female    DOB: Nov 18, 1927, 87 y.o.   MRN: [id number]  This visit was conducted in person.  BP 116/68   Pulse 60   Temp 98.1 F (36.7 C) (Temporal)   Ht 4\' 8"  (1.422 m)   Wt 139 lb 5 oz (63.2 kg)   SpO2 95%   BMI 31.23 kg/m    CC: 4 wk f/u visit  Subjective:   HPI: [name] is a 87 y.o. female presenting on 05/27/2020 for Follow-up (Here for 4 wk f/u.)   See prior note for details.  Seen here last month 04/27/2020. At that time had been out of xanax for almost 1 month due to difficulty with communication with previous prescribing office. Due to age, communication limitations (hard of hearing) and to simplify regimen, I agreed to take over xanax prescription.   Longstanding GAD hx previously managed by psychiatrist Noemi Chapel NP on xanax 1mg  1/2 tab QID PRN as well as trazodone 150mg  1/2 tab QHS.  Unsure if she's tried any other antianxiety medications.   Recently saw Dr Dwyane Dee for diabetes management - stable period on current regimen of actos and januvia.   [facility] - [facility] confusion over [facility] enrollment process.   She had questions about her hearing aide - rec check with hearing doctor.  Notes ongoing trouble with depressed mood      Relevant past medical, surgical, family and social history reviewed and updated as indicated. Interim medical history since our last visit reviewed. Allergies and medications reviewed and updated. Outpatient Medications Prior to Visit  Medication Sig Dispense Refill  . acetaminophen (TYLENOL) 500 MG tablet Take 500 mg by mouth every 6 (six) hours as needed for mild pain.    Marland Kitchen ALPRAZolam (XANAX) 1 MG tablet Take 0.5 tablets (0.5 mg total) by mouth 4 (four) times daily as needed for anxiety. 30 tablet 3  . B Complex-C (B COMPLEX-VITAMIN C PO) Take 1 tablet by mouth daily. +zinc    . diclofenac sodium (VOLTAREN) 1 % GEL Apply 1 application topically 3  (three) times daily. 1 Tube 1  . DILT-XR 120 MG 24 hr capsule TAKE 1 CAPSULE BY MOUTH EVERY DAY 30 capsule 6  . ELIQUIS 5 MG TABS tablet TAKE 1 TABLET BY MOUTH TWICE A DAY 60 tablet 5  . famotidine (PEPCID) 20 MG tablet Take 1 tablet (20 mg total) by mouth daily as needed for heartburn (breakthrough heartburn).    . fluticasone (FLONASE) 50 MCG/ACT nasal spray Place into both nostrils.    . furosemide (LASIX) 40 MG tablet TAKE 1 TABLET BY MOUTH TWICE A DAY 180 tablet 1  . glucosamine-chondroitin 500-400 MG tablet Take 1 tablet by mouth daily.    . Lancets (ONETOUCH DELICA PLUS [id number]) MISC Use to test blood sugar once daily dx code E11.9 100 each 1  . loperamide (IMODIUM) 2 MG capsule Take 2 mg by mouth 4 (four) times daily as needed for diarrhea or loose stools.    Marland Kitchen loratadine (CLARITIN) 10 MG tablet Take 10 mg by mouth daily as needed for allergies.    . metoprolol tartrate (LOPRESSOR) 50 MG tablet TAKE 1 TABLET BY MOUTH TWICE A DAY 180 tablet 3  . Multiple Vitamins-Minerals (ICAPS) CAPS Take 1 capsule by mouth daily.    . nitroGLYCERIN (NITROSTAT) 0.4 MG SL tablet Place 1 tablet (0.4 mg total) under the tongue every 5 (five) minutes as  needed for chest pain. 25 tablet 3  . nystatin ointment (MYCOSTATIN) Apply 1 application topically 2 (two) times daily. 30 g 3  . nystatin-triamcinolone ointment (MYCOLOG) Apply to affected area twice a day as needed for itching    . ONETOUCH VERIO test strip USE TO TEST BLOOD SUGAR ONCE DAILY. DX:E11.9 50 strip 11  . pantoprazole (PROTONIX) 40 MG tablet TAKE 1 TABLET BY MOUTH TWICE A DAY 180 tablet 3  . pioglitazone (ACTOS) 15 MG tablet TAKE 1 TABLET BY MOUTH EVERY DAY 30 tablet 6  . polyethylene glycol powder (GLYCOLAX/MIRALAX) powder Take 17 g by mouth daily. Hold for diarrhea 3350 g 1  . potassium chloride (MICRO-K) 10 MEQ CR capsule TAKE 1 CAPSULE BY MOUTH EVERY DAY 90 capsule 1  . Probiotic Product (ALIGN) 4 MG CAPS Take 1 capsule (4 mg total) by mouth  daily.    . sitaGLIPtin (JANUVIA) 100 MG tablet TAKE 1/2 TABLET BY MOUTH EVERY DAY 30 tablet 5  . traZODone (DESYREL) 150 MG tablet Take 0.5 tablets (75 mg total) by mouth at bedtime. 45 tablet 3  . vitamin E 1000 UNIT capsule Take 1,000 Units by mouth daily.    Marland Kitchen HYDROcodone-acetaminophen (NORCO/VICODIN) 5-325 MG tablet Take 0.5 tablets by mouth 2 (two) times daily as needed for moderate pain or severe pain. 30 tablet 0  . pravastatin (PRAVACHOL) 20 MG tablet TAKE 1 TABLET BY MOUTH EVERY DAY 90 tablet 3   No facility-administered medications prior to visit.     Per HPI unless specifically indicated in ROS section below Review of Systems Objective:  BP 116/68   Pulse 60   Temp 98.1 F (36.7 C) (Temporal)   Ht 4\' 8"  (1.422 m)   Wt 139 lb 5 oz (63.2 kg)   SpO2 95%   BMI 31.23 kg/m   Wt Readings from Last 3 Encounters:  05/27/20 139 lb 5 oz (63.2 kg)  05/07/20 137 lb 9.6 oz (62.4 kg)  04/27/20 136 lb 8 oz (61.9 kg)      Physical Exam Vitals and nursing note reviewed.  Constitutional:      Appearance: Normal appearance. She is not ill-appearing.     Comments: Ambulates with walker  Musculoskeletal:     Comments: Compression stockings in place  Neurological:     Mental Status: She is alert.  Psychiatric:        Mood and Affect: Mood is anxious.        Behavior: Behavior normal.     Comments: Anxious with discussion of med changes       Results for orders placed or performed in visit on 05/07/20  POCT HgB A1C  Result Value Ref Range   Hemoglobin A1C 7.3 (A) 4.0 - 5.6 %   HbA1c POC (<> result, manual entry)     HbA1c, POC (prediabetic range)     HbA1c, POC (controlled diabetic range)     *Note: Due to a large number of results and/or encounters for the requested time period, some results have not been displayed. A complete set of results can be found in Results Review.   Assessment & Plan:  This visit occurred during the SARS-CoV-2 public health emergency.  Safety  protocols were in place, including screening questions prior to the visit, additional usage of staff PPE, and extensive cleaning of exam room while observing appropriate contact time as indicated for disinfecting solutions.   Problem List Items Addressed This Visit    Type 2 diabetes mellitus with neurological manifestations,  controlled ([place])    Appreciate endo care. Adequate readings on current regimen.       Relevant Medications   pravastatin (PRAVACHOL) 20 MG tablet   Polypharmacy    Difficulty hearing complications communication with patient.       Hypertension    Chronic, stable. Continue current regimen.       Relevant Medications   pravastatin (PRAVACHOL) 20 MG tablet   GAD (generalized anxiety disorder) - Primary    Longstanding.  Continue xanax and trazodone. Pt resistant to any med changes at this time. Will continue to explore this.       Encounter for chronic pain management    Hydrocodone refilled.       Diabetes mellitus type 2 with peripheral artery disease (HCC)   Relevant Medications   pravastatin (PRAVACHOL) 20 MG tablet   Bilateral hearing loss    Marked hearing loss.  She states she only has left hearing aide/  Having trouble with hearing aide.  Advised f/u with audiologist.           Meds ordered this encounter  Medications  . pravastatin (PRAVACHOL) 20 MG tablet    Sig: Take 1 tablet (20 mg total) by mouth daily.    Dispense:  90 tablet    Refill:  3  . HYDROcodone-acetaminophen (NORCO/VICODIN) 5-325 MG tablet    Sig: Take 0.5 tablets by mouth 2 (two) times daily as needed for moderate pain or severe pain.    Dispense:  30 tablet    Refill:  0   No orders of the defined types were placed in this encounter.   Patient Instructions  Check with hearing doctor on hearing aides.  Pravastatin refilled today  Call pharmacy to ask about xanax refill - you have 3 doses  I have refilled hydrocodone for you today.  Return in 2-3 months for follow  up visit - cancel March appointment, keep April appointment.    Follow up plan: No follow-ups on file.  Ria Bush, MD

## 2020-05-27 NOTE — Assessment & Plan Note (Signed)
Chronic, stable. Continue current regimen. 

## 2020-05-27 NOTE — Assessment & Plan Note (Signed)
Hydrocodone refilled.  

## 2020-05-27 NOTE — Assessment & Plan Note (Addendum)
Marked hearing loss.  She states she only has left hearing aide/  Having trouble with hearing aide.  Advised f/u with audiologist.

## 2020-05-27 NOTE — Assessment & Plan Note (Signed)
Difficulty hearing complications communication with patient.

## 2020-05-27 NOTE — Patient Instructions (Addendum)
Check with hearing doctor on hearing aides.  Pravastatin refilled today  Call pharmacy to ask about xanax refill - you have 3 doses  I have refilled hydrocodone for you today.  Return in 2-3 months for follow up visit - cancel March appointment, keep April appointment.

## 2020-05-27 NOTE — Assessment & Plan Note (Signed)
Longstanding.  Continue xanax and trazodone. Pt resistant to any med changes at this time. Will continue to explore this.

## 2020-05-27 NOTE — Assessment & Plan Note (Signed)
Appreciate endo care. Adequate readings on current regimen.

## 2020-05-28 ENCOUNTER — Telehealth: Payer: Self-pay

## 2020-05-28 NOTE — Telephone Encounter (Signed)
[facility] reporting [facility] is [facility] saying her provider sent in a refill of xanax yesterday. [facility] called to report [facility] do not [facility] a [facility] for [facility]. Advised this nurse will f/u with pt.  [place] and spoke with Merrily Pew who reported there were refills there. Advised to refill the med for the pt.  Contacted pt and advised her xanax is at [facility]. Pt said she talked to PCP yesterday at [place] and he was [facility] to [facility] because [facility] is [facility] insurance wants it now. Pt had difficulty understanding so she asked her friend Butch Penny, to talk. Reece Packer of above and she asked for [facility] to [facility]. Advised this would have to be done later after clinic. Butch Penny said she would tell the pt.   [place] and spoke with Gerald Stabs and he reports he [facility].   No new script needs to be sent in.

## 2020-06-04 ENCOUNTER — Telehealth: Payer: Self-pay | Admitting: *Deleted

## 2020-06-04 DIAGNOSIS — K219 Gastro-esophageal reflux disease without esophagitis: Secondary | ICD-10-CM | POA: Diagnosis not present

## 2020-06-04 DIAGNOSIS — F458 Other somatoform disorders: Secondary | ICD-10-CM | POA: Diagnosis not present

## 2020-06-04 NOTE — Telephone Encounter (Signed)
[name] left VM at [facility]. She said she was calling on behalf of [name] but we can call [name] back directly.  [name] just came home from Dr. Sonny Masters appt and she was advise she needs to be on an acidreflux med and to call PCP to get med sent. [name] said they looked around her home and she doesn't have any GERD med and would like PCP to send in med.

## 2020-06-05 ENCOUNTER — Telehealth: Payer: Self-pay | Admitting: Family Medicine

## 2020-06-05 ENCOUNTER — Telehealth: Payer: Self-pay

## 2020-06-05 NOTE — Telephone Encounter (Signed)
error 

## 2020-06-05 NOTE — Telephone Encounter (Signed)
She should be on pantoprazole 40mg  bid and pepcid 20mg  nightly PRN.  Pantoprazole was sent in #180 04/2020 with 3 RF enough to last a year - [facility].  Pepcid is OTC.  Is she not taking this?

## 2020-06-05 NOTE — Telephone Encounter (Signed)
Patient called requesting advice on when she should take Rx Dexilant... I asked pt where did she get medication and from who... pt stated that she got it at her appt with another doctor, does not know the name of the Dr and does not remember when she received Rx... Pt advised that she needs to make sure she is not taking the Pantoprazole if she is now taking Dexilant....  Pt states she is not taking Pepcid or Pantoprazole that she is aware of.... Pt stated she would call back as she is not sure about medication

## 2020-06-05 NOTE — Telephone Encounter (Signed)
Patient was transferred in house Triage nurse to relay the message. EM

## 2020-06-09 ENCOUNTER — Telehealth: Payer: Self-pay

## 2020-06-09 NOTE — Telephone Encounter (Signed)
[facility] - Client TELEPHONE ADVICE RECORD [facility] Patient Name: [name] Gender: Female DOB: Feb 17, 1928 Age: 87 Y 3 D Return Phone Number: [phone] (Primary), [phone] (Secondary) Address: [place] City/State/Zip: [place] [address] Client [facility] - Client Client Site [facility] Physician Ria Bush - MD Contact Type Call Who Is Calling Patient / Member / Family / Caregiver Call Type Triage / Clinical Relationship To Patient Self Return Phone Number [phone] (Primary) Chief Complaint Urination Pain Reason for Call Symptomatic / Request for [place] states she is having UTI symptoms. Call came in from the office, was not able to speak with caller as she hung up while receptionist was giving information. Translation No Disp. Time Eilene Ghazi Time) Disposition Final User 06/09/2020 12:17:44 PM Attempt made - no message left Otelia Santee 06/09/2020 12:29:14 PM Attempt made - line busy Clovis Riley, RN, Georgina Peer 06/09/2020 12:41:44 PM Clinical Call Yes Clovis Riley, RN, Georgina Peer Comments User: Arman Bogus, RN Date/Time Eilene Ghazi Time): 06/09/2020 12:41:35 PM Caller states she is seeing her doctor tomorrow and refuses triage

## 2020-06-09 NOTE — Telephone Encounter (Signed)
Per appt notes pt has appt already scheduled with Dr Darnell Level on 06/09/20

## 2020-06-10 ENCOUNTER — Other Ambulatory Visit: Payer: Self-pay

## 2020-06-10 ENCOUNTER — Ambulatory Visit (INDEPENDENT_AMBULATORY_CARE_PROVIDER_SITE_OTHER): Payer: Medicare Other | Admitting: Family Medicine

## 2020-06-10 ENCOUNTER — Encounter: Payer: Self-pay | Admitting: Family Medicine

## 2020-06-10 VITALS — BP 130/76 | HR 97 | Temp 97.6°F | Ht <= 58 in | Wt 140.5 lb

## 2020-06-10 DIAGNOSIS — N3000 Acute cystitis without hematuria: Secondary | ICD-10-CM

## 2020-06-10 DIAGNOSIS — K279 Peptic ulcer, site unspecified, unspecified as acute or chronic, without hemorrhage or perforation: Secondary | ICD-10-CM | POA: Diagnosis not present

## 2020-06-10 DIAGNOSIS — R300 Dysuria: Secondary | ICD-10-CM

## 2020-06-10 DIAGNOSIS — Z79899 Other long term (current) drug therapy: Secondary | ICD-10-CM | POA: Diagnosis not present

## 2020-06-10 LAB — POC URINALSYSI DIPSTICK (AUTOMATED)
Bilirubin, UA: NEGATIVE
Glucose, UA: NEGATIVE
Ketones, UA: NEGATIVE
Nitrite, UA: NEGATIVE
Protein, UA: NEGATIVE
Spec Grav, UA: 1.02 (ref 1.010–1.025)
Urobilinogen, UA: 0.2 E.U./dL
pH, UA: 6 (ref 5.0–8.0)

## 2020-06-10 MED ORDER — SULFAMETHOXAZOLE-TRIMETHOPRIM 800-160 MG PO TABS: 1.0000 | ORAL_TABLET | Freq: Two times a day (BID) | ORAL | 0 refills | Status: DC

## 2020-06-10 NOTE — Assessment & Plan Note (Signed)
UA suspicious for repeat infection - send culture, start bactrim DS BID x3d course.

## 2020-06-10 NOTE — Progress Notes (Signed)
[name], female    DOB: [birth year], 87 y.o.   MRN: [id number]  This visit was conducted in person.  BP 130/76   Pulse 97   Temp 97.6 F (36.4 C) (Temporal)   Ht 4\' 8"  (1.422 m)   Wt 140 lb 8 oz (63.7 kg)   SpO2 95%   BMI 31.50 kg/m    CC: possible UTI Subjective:   HPI: [name] is a 87 y.o. female presenting on 06/10/2020 for Vaginal Pain (C/o pain when urinating.  Started about 1 wk ago.  Pt accompanied by friend, Jerrye Beavers- temp 97.8.)   1.5 wk h/o dysuria, associated with frequency, urgency with some accidents, but no fever, nausea, abd pain or hematuria.   Saw ENT Virgia Land who thought throat symptoms were coming from GERD - she doesn't know what she's been taking but states "I'm taking what's on the list". She thinks she's been taking protonix bid and pepcid.     Relevant past medical, surgical, family and social history reviewed and updated as indicated. Interim medical history since our last visit reviewed. Allergies and medications reviewed and updated. Outpatient Medications Prior to Visit  Medication Sig Dispense Refill  . acetaminophen (TYLENOL) 500 MG tablet Take 500 mg by mouth every 6 (six) hours as needed for mild pain.    Marland Kitchen ALPRAZolam (XANAX) 1 MG tablet Take 0.5 tablets (0.5 mg total) by mouth 4 (four) times daily as needed for anxiety. 30 tablet 3  . B Complex-C (B COMPLEX-VITAMIN C PO) Take 1 tablet by mouth daily. +zinc    . diclofenac sodium (VOLTAREN) 1 % GEL Apply 1 application topically 3 (three) times daily. 1 Tube 1  . DILT-XR 120 MG 24 hr capsule TAKE 1 CAPSULE BY MOUTH EVERY DAY 30 capsule 6  . ELIQUIS 5 MG TABS tablet TAKE 1 TABLET BY MOUTH TWICE A DAY 60 tablet 5  . famotidine (PEPCID) 20 MG tablet Take 1 tablet (20 mg total) by mouth daily as needed for heartburn (breakthrough heartburn).    . fluticasone (FLONASE) 50 MCG/ACT nasal spray Place into both nostrils.    . furosemide (LASIX) 40 MG tablet TAKE 1 TABLET BY MOUTH  TWICE A DAY 180 tablet 1  . glucosamine-chondroitin 500-400 MG tablet Take 1 tablet by mouth daily.    Marland Kitchen HYDROcodone-acetaminophen (NORCO/VICODIN) 5-325 MG tablet Take 0.5 tablets by mouth 2 (two) times daily as needed for moderate pain or severe pain. 30 tablet 0  . Lancets (ONETOUCH DELICA PLUS [id number]) MISC Use to test blood sugar once daily dx code E11.9 100 each 1  . loperamide (IMODIUM) 2 MG capsule Take 2 mg by mouth 4 (four) times daily as needed for diarrhea or loose stools.    Marland Kitchen loratadine (CLARITIN) 10 MG tablet Take 10 mg by mouth daily as needed for allergies.    . metoprolol tartrate (LOPRESSOR) 50 MG tablet TAKE 1 TABLET BY MOUTH TWICE A DAY 180 tablet 3  . Multiple Vitamins-Minerals (ICAPS) CAPS Take 1 capsule by mouth daily.    . nitroGLYCERIN (NITROSTAT) 0.4 MG SL tablet Place 1 tablet (0.4 mg total) under the tongue every 5 (five) minutes as needed for chest pain. 25 tablet 3  . nystatin ointment (MYCOSTATIN) Apply 1 application topically 2 (two) times daily. 30 g 3  . ONETOUCH VERIO test strip USE TO TEST BLOOD SUGAR ONCE DAILY. DX:E11.9 50 strip 11  . pantoprazole (PROTONIX) 40 MG tablet TAKE 1 TABLET BY MOUTH TWICE  A DAY 180 tablet 3  . pioglitazone (ACTOS) 15 MG tablet TAKE 1 TABLET BY MOUTH EVERY DAY 30 tablet 6  . polyethylene glycol powder (GLYCOLAX/MIRALAX) powder Take 17 g by mouth daily. Hold for diarrhea 3350 g 1  . potassium chloride (MICRO-K) 10 MEQ CR capsule TAKE 1 CAPSULE BY MOUTH EVERY DAY 90 capsule 1  . pravastatin (PRAVACHOL) 20 MG tablet Take 1 tablet (20 mg total) by mouth daily. 90 tablet 3  . Probiotic Product (ALIGN) 4 MG CAPS Take 1 capsule (4 mg total) by mouth daily.    . sitaGLIPtin (JANUVIA) 100 MG tablet TAKE 1/2 TABLET BY MOUTH EVERY DAY 30 tablet 5  . traZODone (DESYREL) 150 MG tablet Take 0.5 tablets (75 mg total) by mouth at bedtime. 45 tablet 3  . vitamin E 1000 UNIT capsule Take 1,000 Units by mouth daily.    Marland Kitchen nystatin-triamcinolone  ointment (MYCOLOG) Apply to affected area twice a day as needed for itching     No facility-administered medications prior to visit.     Per HPI unless specifically indicated in ROS section below Review of Systems Objective:  BP 130/76   Pulse 97   Temp 97.6 F (36.4 C) (Temporal)   Ht 4\' 8"  (1.422 m)   Wt 140 lb 8 oz (63.7 kg)   SpO2 95%   BMI 31.50 kg/m   Wt Readings from Last 3 Encounters:  06/10/20 140 lb 8 oz (63.7 kg)  05/27/20 139 lb 5 oz (63.2 kg)  05/07/20 137 lb 9.6 oz (62.4 kg)      Physical Exam Vitals and nursing note reviewed.  Constitutional:      Appearance: Normal appearance. She is not ill-appearing.  Neurological:     Mental Status: She is alert.  Psychiatric:        Mood and Affect: Mood is anxious.       Results for orders placed or performed in visit on 06/10/20  POCT Urinalysis Dipstick (Automated)  Result Value Ref Range   Color, UA bright yellow    Clarity, UA cloudy    Glucose, UA Negative Negative   Bilirubin, UA negative    Ketones, UA negative    Spec Grav, UA 1.020 1.010 - 1.025   Blood, UA +/-    pH, UA 6.0 5.0 - 8.0   Protein, UA Negative Negative   Urobilinogen, UA 0.2 0.2 or 1.0 E.U./dL   Nitrite, UA negative    Leukocytes, UA Moderate (2+) (A) Negative   *Note: Due to a large number of results and/or encounters for the requested time period, some results have not been displayed. A complete set of results can be found in Results Review.   Assessment & Plan:  This visit occurred during the SARS-CoV-2 public health emergency.  Safety protocols were in place, including screening questions prior to the visit, additional usage of staff PPE, and extensive cleaning of exam room while observing appropriate contact time as indicated for disinfecting solutions.   Problem List Items Addressed This Visit    Peptic ulcer disease    Reviewed current reflux regimen of protonix 40mg  bid and pepcid 20mg  nightly.       Polypharmacy   UTI  (urinary tract infection) - Primary    UA suspicious for repeat infection - send culture, start bactrim DS BID x3d course.       Relevant Medications   sulfamethoxazole-trimethoprim (BACTRIM DS) 800-160 MG tablet    Other Visit Diagnoses    Dysuria  Relevant Orders   POCT Urinalysis Dipstick (Automated) (Completed)   Urine Culture       Meds ordered this encounter  Medications  . sulfamethoxazole-trimethoprim (BACTRIM DS) 800-160 MG tablet    Sig: Take 1 tablet by mouth 2 (two) times daily.    Dispense:  6 tablet    Refill:  0   Orders Placed This Encounter  Procedures  . Urine Culture  . POCT Urinalysis Dipstick (Automated)    Patient Instructions  You may have UTI  Treat UTI with bactrim twice daily  We will call you with culture results.   Heartburn medicines: protonix 40mg  twice daily and pepcid 20mg  nightly   Follow up plan: Return if symptoms worsen or fail to improve.  Ria Bush, MD

## 2020-06-10 NOTE — Assessment & Plan Note (Signed)
Reviewed current reflux regimen of protonix 40mg  bid and pepcid 20mg  nightly.

## 2020-06-10 NOTE — Patient Instructions (Addendum)
You may have UTI  Treat UTI with bactrim twice daily  We will call you with culture results.   Heartburn medicines: protonix 40mg  twice daily and pepcid 20mg  nightly

## 2020-06-12 LAB — URINE CULTURE
MICRO NUMBER:: 11626922
SPECIMEN QUALITY:: ADEQUATE

## 2020-06-24 ENCOUNTER — Ambulatory Visit: Payer: Medicare Other | Admitting: Family Medicine

## 2020-06-25 ENCOUNTER — Ambulatory Visit: Payer: Medicare Other | Admitting: Dermatology

## 2020-06-29 ENCOUNTER — Encounter: Payer: Self-pay | Admitting: Cardiovascular Disease

## 2020-06-29 NOTE — Progress Notes (Signed)
[name] Date of Birth  06/09/1927 [facility] [address]    [place], [place]  [address] [phone]  Fax  [phone]   Problem list 1. Coronary artery disease-status post CABG in 1996 2. Diabetes mellitus 3. Hypertension 4. Hyperlipidemia 5. Right  DVT.    [name] is an 87 yo female with a Hx of CAD, CABG ( November 09, 1994), diabetes mellitus, anxiety, HTN, hyperlipidemia.   She's been under a lot of stress recently. Her son died last year. She's under a lot of stress with the extra expenses involved such as his funeral. She presented with some CP in December.  A stress myoview revealed lateral ischemia.  We offered cardiac cath but she did not want to have the cath. She presents today without any particular complaints. She has not had any episodes of chest pain or shortness of breath since I saw her a month ago. She does have occasional episodes of tachycardia. She also notes that she has some anxiety. She was last seen in January after having an abnormal stress test that revealed anterior apical ischemia. She refused cardiac catheterization at that time. She has not had any recent episodes of chest pain.  She does not want to have a cardiac catheterization since she's feeling so well.  She has had some chest burning that is nearly constant.  I suggest several times that she should consider getting the cath done and she changed to subject.  September 04, 2012:  [name] is doing well from a cardiac standpoint.  She is still working - despite having lots of back pain.  No CP .  She had some CP several years ago and had a mildly abnormal myoview.  She did not want to have a cardiac cath at that time and has not had any significant epidoses of CP since that time.  She continues to be under lots of stress  She complains of back pain, stomach pain, burning in her legs.    Nov. 24, 2014  Pt is doing OK from a cardiac standpoint.  Lots of stress - she has been  running the [employer] by herself for the past several months.  She is not getting enough rest.  Sleeping 4 1/2 hours at night - tries to get a nap for an hour.    September 09, 2013:  [name] is doing ok.  She is under lots of stress as usual.    Nov. 10, 2015:  [name] presents with various aches and pains.  She has a vericose vein in her leg that bled. She is on Xarelto for a right leg DVT on Sept. 30. .   CT angio a week later was negative for PE.   She has lots of anxiety.  Has lots of chest pain.  The pain is not pleuretic.  The pain is not associated with exercise.  She does not have any pain while she is working but more often occurs with stress.   She is living with 2 elderly men now and has lots of stressful issues relating to that.    This seems to be a major cause of her pain.  She does have 85 year old SVGs  September 23, 2014:  Doing well from a cardiac standpoint. Lots of anxiety .  Has a open right heel ulcer.   Jan. 9, 2017  [name] has been doing ok No CP.  No dyspnea Has had right ankle pain and swelling  Occasionally has faster than normal heart rate,  No associated dyspnea , CP or dizziness.  Has had an upper respiratory tract infection  Recently   October 19, 2015:  Has had a UTI since I last saw her.  Was in the hospital  Had new A-fib with RVR with the hospitalization  Has converted back to NSR .   Aug 03, 2016:  No serious cardiac symptoms.   Has occasional episodes of tachycardia   February 21, 2018:  Doing well from a cardiac standpoint .   Seen with Granddaughter in [place]. Has some palpitations.  Especially when she gets up too fast.    April 09, 2018:  Has redness and swelling of her right leg, Has an open woulnd,    Dropped a knife on her leg. Has finished her Abx treatment .    Sept. 10., 2020  Meoshia is seen for follow up of her CAD and leg edema  Feels that her HR is irreglual, Has been this way for 1-2 months  No CP    May 27, 2019: [name] is seen for follow-up of her coronary artery disease, atrial fibrillation.  She also has a history of diabetes mellitus. Forgot her hearing aids today .[name]   Very difficult interview  Ate breakfast,  Did not eat lunch  Some weakness,   Some dyspnea   October 22, 2019:  [name] is seen today .  No cp. Has chronic dyspnea  Very hard of hearing Tangential in her conversation Very difficult conversatio  due to her hearing  Loss   June 30, 2020: [name] is seen today for follow up of her CAD, DM ,  Here with [name], friend .  Has significant hearing loss Very tangential in her conversation  Not able to hear.  Conversing is very difficult  Seems lonely ,  She Wanted to talk more but could not follow the conversation    Current Outpatient Medications on File Prior to Visit  Medication Sig Dispense Refill  . ALPRAZolam (XANAX) 1 MG tablet Take 0.5 tablets (0.5 mg total) by mouth 4 (four) times daily as needed for anxiety. 30 tablet 3  . B Complex-C (B COMPLEX-VITAMIN C PO) Take 1 tablet by mouth daily. +zinc    . diclofenac sodium (VOLTAREN) 1 % GEL Apply 1 application topically 3 (three) times daily. 1 Tube 1  . DILT-XR 120 MG 24 hr capsule TAKE 1 CAPSULE BY MOUTH EVERY DAY 30 capsule 6  . ELIQUIS 5 MG TABS tablet TAKE 1 TABLET BY MOUTH TWICE A DAY 60 tablet 5  . famotidine (PEPCID) 20 MG tablet Take 1 tablet (20 mg total) by mouth daily as needed for heartburn (breakthrough heartburn).    . fluticasone (FLONASE) 50 MCG/ACT nasal spray Place into both nostrils.    . furosemide (LASIX) 40 MG tablet TAKE 1 TABLET BY MOUTH TWICE A DAY 180 tablet 1  . glucosamine-chondroitin 500-400 MG tablet Take 1 tablet by mouth daily.    [name] HYDROcodone-acetaminophen (NORCO/VICODIN) 5-325 MG tablet Take 0.5 tablets by mouth 2 (two) times daily as needed for moderate pain or severe pain. 30 tablet 0  . Lancets (ONETOUCH DELICA PLUS [id number]) MISC Use to test blood sugar once  daily dx code E11.9 100 each 1  . loperamide (IMODIUM) 2 MG capsule Take 2 mg by mouth 4 (four) times daily as needed for diarrhea or loose stools.    [name] loratadine (CLARITIN) 10 MG tablet Take 10 mg by mouth  daily as needed for allergies.    . metoprolol tartrate (LOPRESSOR) 50 MG tablet TAKE 1 TABLET BY MOUTH TWICE A DAY 180 tablet 3  . Multiple Vitamins-Minerals (ICAPS) CAPS Take 1 capsule by mouth daily.    . nitroGLYCERIN (NITROSTAT) 0.4 MG SL tablet Place 1 tablet (0.4 mg total) under the tongue every 5 (five) minutes as needed for chest pain. 25 tablet 3  . nystatin ointment (MYCOSTATIN) Apply 1 application topically 2 (two) times daily. 30 g 3  . ONETOUCH VERIO test strip USE TO TEST BLOOD SUGAR ONCE DAILY. DX:E11.9 50 strip 11  . pantoprazole (PROTONIX) 40 MG tablet TAKE 1 TABLET BY MOUTH TWICE A DAY 180 tablet 3  . pioglitazone (ACTOS) 15 MG tablet TAKE 1 TABLET BY MOUTH EVERY DAY 30 tablet 6  . potassium chloride (MICRO-K) 10 MEQ CR capsule TAKE 1 CAPSULE BY MOUTH EVERY DAY 90 capsule 1  . pravastatin (PRAVACHOL) 20 MG tablet Take 1 tablet (20 mg total) by mouth daily. 90 tablet 3  . Probiotic Product (ALIGN) 4 MG CAPS Take 1 capsule (4 mg total) by mouth daily.    . sitaGLIPtin (JANUVIA) 100 MG tablet TAKE 1/2 TABLET BY MOUTH EVERY DAY 30 tablet 5  . sulfamethoxazole-trimethoprim (BACTRIM DS) 800-160 MG tablet Take 1 tablet by mouth 2 (two) times daily. 6 tablet 0  . traZODone (DESYREL) 150 MG tablet Take 0.5 tablets (75 mg total) by mouth at bedtime. 45 tablet 3  . vitamin E 1000 UNIT capsule Take 1,000 Units by mouth daily.     No current facility-administered medications on file prior to visit.    Allergies  Allergen Reactions  . Metformin And Related Diarrhea    Past Medical History:  Diagnosis Date  . Actinic keratosis 01/22/2020   right forearm  . Acute deep vein thrombosis (DVT) of distal vein of right lower extremity ([place]) 12/2013   Acute thrombus of R proximal  gastrocnemius vein. Symptomatic provoked distal DVT  . Anxiety   . Basal cell carcinoma 10/30/2018   R nasal bridge, treated with Moh's  . Basal cell carcinoma 01/22/2020   left forearm, nodular  . Cellulitis of leg, left 01/16/2014  . CHF (congestive heart failure) ([place])   . Chronic back pain 03/2012   lumbar DDD with herniation - s/p L S1 transforaminal ESI (10/2012 Dr. Sharlet Salina)  . Chronic venous insufficiency   . Coronary artery disease    CABG 1998  . Cystocele   . Depression   . Diabetes mellitus    type 2, followed by endo.  . Diastolic dysfunction   . Diverticulosis   . Hemorrhoids   . History of heart attack   . Hx of echocardiogram    a. Echo 10/13: mild LVH, EF 55-60%, Gr 1 diast dysfn, PASP 32  . Hyperlipidemia   . Hypertension   . Irritable bowel syndrome   . Microcytic anemia   . PAD (peripheral artery disease) ([place]) 2013   ABI: R 0.91, L 0.83  . Postmenopausal osteoporosis 01/2019   T score of -2.6  . PUD (peptic ulcer disease)   . Varicose veins   . Venous ulcer of leg ([place]) 12/12/2013   Required prolonged treatment by wound center  . Viral gastroenteritis due to [place] virus 05/13/2015   hospitalization    Past Surgical History:  Procedure Laterality Date  . ABDOMINAL HYSTERECTOMY  1975   TAH,BSO  . ABI  2013   R 0.91, L 0.83  . APPENDECTOMY  per pt report  . CARDIOVASCULAR STRESS TEST  2015   low risk myoview (Afton Mikelson)  . CATARACT SURGERY    . CHOLECYSTECTOMY    . COLONOSCOPY  02/2010   extensive diverticulosis throughout, small internal hemorrhoids, rec rpt 5 yrs (Dr. Allyn Kenner)  . CORONARY ARTERY BYPASS GRAFT  1998  . dexa scan  09/2011   femur -0.8, forearm 0.6 => normal  . ENDOVENOUS ABLATION SAPHENOUS VEIN W/ LASER Left 03/2014   Kellie Simmering  . ESI  2014   L S1 transforaminal ESI x2 (Ch[place]) without improvement  . HAND SURGERY    . lexiscan myoview  03/2011   Small basal inferolateral and anterolateral reversible perfusion defect suggests  ischemia.  EF was normal.   old infarct, no new ischemia  . LOWER EXTREMITY ANGIOGRAPHY Right 09/06/2018   Procedure: LOWER EXTREMITY ANGIOGRAPHY;  Surgeon: Algernon Huxley, MD;  Location: [facility];  Service: Cardiovascular;  Laterality: Right;  . toe surgery    . TYMPANOPLASTY  1960s   R side  . Vault prolapse A & P repair  2009   Dr Rhodia Albright [facility] hospital    Social History   Tobacco Use  Smoking Status Former Smoker  . Quit date: 03/05/1979  . Years since quitting: 41.3  Smokeless Tobacco Never Used    Social History   Substance and Sexual Activity  Alcohol Use No  . Alcohol/week: 0.0 standard drinks    Family History  Problem Relation Age of Onset  . Hypertension Mother   . Stroke Mother   . Diabetes Father   . Diabetes Sister   . Diabetes Brother   . Heart disease Son   . Colon cancer Neg Hx     Reviw of Systems:  Reviewed in the HPI.  All other systems are negative.   Physical Exam: Blood pressure 108/62, pulse (!) 102, height 4\' 8"  (1.422 m), weight 133 lb (60.3 kg), SpO2 95 %.  GEN:  Elderly , moderate obese femlale, deaf   HEENT: Normal NECK: No JVD; No carotid bruits LYMPHATICS: No lymphadenopathy CARDIAC: RRR   RESPIRATORY:  Clear to auscultation without rales, wheezing or rhonchi  ABDOMEN: Soft, non-tender, non-distended MUSCULOSKELETAL:  No edema; No deformity  SKIN: Warm and dry NEUROLOGIC:  Alert and oriented x 3     ECG:      Assessment / Plan:   1. Coronary artery disease-status post CABG in 1996 - No angina ,  Cont meds   2. Paroxysmal atrial fib :      rhyrhm is regular   3. Shortness of breath:    4. Hypertension -  BP is well controlled    5. Hyperlipidemia  -  Stable   6. Right  DVT.  Mertie Moores, MD  06/30/2020 3:20 PM    [facility] [address],  [place], [place]  [address] Pager [phone] Phone: [phone]; Fax: [phone]

## 2020-06-30 ENCOUNTER — Ambulatory Visit (INDEPENDENT_AMBULATORY_CARE_PROVIDER_SITE_OTHER): Payer: Medicare Other | Admitting: Cardiovascular Disease

## 2020-06-30 ENCOUNTER — Encounter: Payer: Self-pay | Admitting: Cardiovascular Disease

## 2020-06-30 ENCOUNTER — Other Ambulatory Visit: Payer: Self-pay

## 2020-06-30 VITALS — BP 108/62 | HR 102 | Ht <= 58 in | Wt 133.0 lb

## 2020-06-30 DIAGNOSIS — I2510 Atherosclerotic heart disease of native coronary artery without angina pectoris: Secondary | ICD-10-CM | POA: Diagnosis not present

## 2020-06-30 NOTE — Patient Instructions (Signed)
Medication Instructions:  Your physician recommends that you continue on your current medications as directed. Please refer to the Current Medication list given to you today.  *If you need a refill on your cardiac medications before your next appointment, please call your pharmacy*   Lab Work: none If you have labs (blood work) drawn today and your tests are completely normal, you will receive your results only by: . MyChart Message (if you have MyChart) OR . A paper copy in the mail If you have any lab test that is abnormal or we need to change your treatment, we will call you to review the results.   Testing/Procedures: none   Follow-Up: At [facility], you and your health needs are our priority.  As part of our continuing mission to provide you with exceptional heart care, we have created designated Provider Care Teams.  These Care Teams include your primary Cardiologist (physician) and Advanced Practice Providers (APPs -  Physician Assistants and Nurse Practitioners) who all work together to provide you with the care you need, when you need it.   Your next appointment:   6 month(s)  The format for your next appointment:   In Person  Provider:   Scott Weaver, PA-C    

## 2020-07-01 ENCOUNTER — Telehealth: Payer: Self-pay

## 2020-07-01 NOTE — Chronic Care Management (AMB) (Signed)
    Chronic Care Management Pharmacy Assistant   Name: [name]  MRN: [id number] DOB: [birth year]   Reason for Encounter: Patient Assistance Update- [employer]    Medications: Outpatient Encounter Medications as of 07/01/2020  Medication Sig  . ALPRAZolam (XANAX) 1 MG tablet Take 0.5 tablets (0.5 mg total) by mouth 4 (four) times daily as needed for anxiety.  . B Complex-C (B COMPLEX-VITAMIN C PO) Take 1 tablet by mouth daily. +zinc  . diclofenac sodium (VOLTAREN) 1 % GEL Apply 1 application topically 3 (three) times daily.  Marland Kitchen DILT-XR 120 MG 24 hr capsule TAKE 1 CAPSULE BY MOUTH EVERY DAY  . ELIQUIS 5 MG TABS tablet TAKE 1 TABLET BY MOUTH TWICE A DAY  . famotidine (PEPCID) 20 MG tablet Take 1 tablet (20 mg total) by mouth daily as needed for heartburn (breakthrough heartburn).  . fluticasone (FLONASE) 50 MCG/ACT nasal spray Place into both nostrils.  . furosemide (LASIX) 40 MG tablet TAKE 1 TABLET BY MOUTH TWICE A DAY  . glucosamine-chondroitin 500-400 MG tablet Take 1 tablet by mouth daily.  Marland Kitchen HYDROcodone-acetaminophen (NORCO/VICODIN) 5-325 MG tablet Take 0.5 tablets by mouth 2 (two) times daily as needed for moderate pain or severe pain.  . Lancets (ONETOUCH DELICA PLUS [id number]) MISC Use to test blood sugar once daily dx code E11.9  . loperamide (IMODIUM) 2 MG capsule Take 2 mg by mouth 4 (four) times daily as needed for diarrhea or loose stools.  Marland Kitchen loratadine (CLARITIN) 10 MG tablet Take 10 mg by mouth daily as needed for allergies.  . metoprolol tartrate (LOPRESSOR) 50 MG tablet TAKE 1 TABLET BY MOUTH TWICE A DAY  . Multiple Vitamins-Minerals (ICAPS) CAPS Take 1 capsule by mouth daily.  . nitroGLYCERIN (NITROSTAT) 0.4 MG SL tablet Place 1 tablet (0.4 mg total) under the tongue every 5 (five) minutes as needed for chest pain.  Marland Kitchen nystatin ointment (MYCOSTATIN) Apply 1 application topically 2 (two) times daily.  Glory Rosebush VERIO test strip USE TO TEST BLOOD SUGAR ONCE DAILY.  DX:E11.9  . pantoprazole (PROTONIX) 40 MG tablet TAKE 1 TABLET BY MOUTH TWICE A DAY  . pioglitazone (ACTOS) 15 MG tablet TAKE 1 TABLET BY MOUTH EVERY DAY  . potassium chloride (MICRO-K) 10 MEQ CR capsule TAKE 1 CAPSULE BY MOUTH EVERY DAY  . pravastatin (PRAVACHOL) 20 MG tablet Take 1 tablet (20 mg total) by mouth daily.  . Probiotic Product (ALIGN) 4 MG CAPS Take 1 capsule (4 mg total) by mouth daily.  . sitaGLIPtin (JANUVIA) 100 MG tablet TAKE 1/2 TABLET BY MOUTH EVERY DAY  . sulfamethoxazole-trimethoprim (BACTRIM DS) 800-160 MG tablet Take 1 tablet by mouth 2 (two) times daily.  . traZODone (DESYREL) 150 MG tablet Take 0.5 tablets (75 mg total) by mouth at bedtime.  . vitamin E 1000 UNIT capsule Take 1,000 Units by mouth daily.   No facility-administered encounter medications on file as of 07/01/2020.   Contacted [employer]  to follow up on patient assistance application for [employer]. Per representative at [employer] states patient has been approved starting 06/18/20 - 06/17/21    Follow-Up:  Patient Assistance Coordination and Pharmacist Review  [name], CPP notified  Margaretmary Dys, [place] [phone]  Total time spent for month:  CPA 21

## 2020-07-03 ENCOUNTER — Telehealth: Payer: Self-pay | Admitting: Family Medicine

## 2020-07-03 NOTE — Telephone Encounter (Signed)
Nystatin oint Last filled:  04/08/20, #30 g Last OV:  Acute cystitis Next OV:  07/29/20, 3 mo chronic pain f/u

## 2020-07-06 NOTE — Telephone Encounter (Signed)
Patient called back about the refill. Please advise

## 2020-07-06 NOTE — Telephone Encounter (Signed)
Refill was sent to [facility] on Fri, 07/03/20.   Attempted to contact pt.  No answer.  Vm not set up.  Need to inform pt of info above.

## 2020-07-07 NOTE — Telephone Encounter (Signed)
Per Donata Clay, pt returned call and was made aware of message.

## 2020-07-14 ENCOUNTER — Telehealth: Payer: Self-pay

## 2020-07-14 NOTE — Chronic Care Management (AMB) (Addendum)
Chronic Care Management Pharmacy Assistant   Name: [name]  MRN: [id number] DOB: 05-Feb-1928.  Reason for Encounter: Disease State  DM  Conditions to be addressed/monitored: DMII  Recent office visits:  06/10/2020          Dr.Gutierrez, PCP - Acute cystitis, start sulfamethoxazole-trimethoprim 800-160 mg 1 tablet BID   Recent consult visits:  06/30/2020 Dr.Nahser, Cardiology - Continue current medications 05/14/2020 Lorie Apley, Optometry 05/07/2020 Kateri Plummer, Endocrinology  [facility] visits:  None in previous 6 months  Medications: Outpatient Encounter Medications as of 07/14/2020  Medication Sig   ALPRAZolam (XANAX) 1 MG tablet Take 0.5 tablets (0.5 mg total) by mouth 4 (four) times daily as needed for anxiety.   B Complex-C (B COMPLEX-VITAMIN C PO) Take 1 tablet by mouth daily. +zinc   diclofenac sodium (VOLTAREN) 1 % GEL Apply 1 application topically 3 (three) times daily.   DILT-XR 120 MG 24 hr capsule TAKE 1 CAPSULE BY MOUTH EVERY DAY   ELIQUIS 5 MG TABS tablet TAKE 1 TABLET BY MOUTH TWICE A DAY   famotidine (PEPCID) 20 MG tablet Take 1 tablet (20 mg total) by mouth daily as needed for heartburn (breakthrough heartburn).   fluticasone (FLONASE) 50 MCG/ACT nasal spray Place into both nostrils.   furosemide (LASIX) 40 MG tablet TAKE 1 TABLET BY MOUTH TWICE A DAY   glucosamine-chondroitin 500-400 MG tablet Take 1 tablet by mouth daily.   HYDROcodone-acetaminophen (NORCO/VICODIN) 5-325 MG tablet Take 0.5 tablets by mouth 2 (two) times daily as needed for moderate pain or severe pain.   Lancets (ONETOUCH DELICA PLUS [id number]) MISC Use to test blood sugar once daily dx code E11.9   loperamide (IMODIUM) 2 MG capsule Take 2 mg by mouth 4 (four) times daily as needed for diarrhea or loose stools.   loratadine (CLARITIN) 10 MG tablet Take 10 mg by mouth daily as needed for allergies.   metoprolol tartrate (LOPRESSOR) 50 MG tablet TAKE 1 TABLET BY MOUTH TWICE A DAY   Multiple  Vitamins-Minerals (ICAPS) CAPS Take 1 capsule by mouth daily.   nitroGLYCERIN (NITROSTAT) 0.4 MG SL tablet Place 1 tablet (0.4 mg total) under the tongue every 5 (five) minutes as needed for chest pain.   nystatin ointment (MYCOSTATIN) APPLY TO AFFECTED AREA TWICE DAILY   ONETOUCH VERIO test strip USE TO TEST BLOOD SUGAR ONCE DAILY. DX:E11.9   pantoprazole (PROTONIX) 40 MG tablet TAKE 1 TABLET BY MOUTH TWICE A DAY   pioglitazone (ACTOS) 15 MG tablet TAKE 1 TABLET BY MOUTH EVERY DAY   potassium chloride (MICRO-K) 10 MEQ CR capsule TAKE 1 CAPSULE BY MOUTH EVERY DAY   pravastatin (PRAVACHOL) 20 MG tablet Take 1 tablet (20 mg total) by mouth daily.   Probiotic Product (ALIGN) 4 MG CAPS Take 1 capsule (4 mg total) by mouth daily.   sitaGLIPtin (JANUVIA) 100 MG tablet TAKE 1/2 TABLET BY MOUTH EVERY DAY   sulfamethoxazole-trimethoprim (BACTRIM DS) 800-160 MG tablet Take 1 tablet by mouth 2 (two) times daily.   traZODone (DESYREL) 150 MG tablet Take 0.5 tablets (75 mg total) by mouth at bedtime.   vitamin E 1000 UNIT capsule Take 1,000 Units by mouth daily.   No facility-administered encounter medications on file as of 07/14/2020.   Recent Relevant Labs: Lab Results  Component Value Date/Time   HGBA1C 7.3 (A) 05/07/2020 03:24 PM   HGBA1C 7.1 (A) 12/24/2019 02:43 PM   HGBA1C 7.4 (H) 09/02/2019 12:03 PM   HGBA1C 6.9 (H) 02/08/2019 01:37 AM  MICROALBUR <0.7 02/19/2020 01:32 PM   MICROALBUR 5.0 (H) 12/24/2019 03:03 PM    Kidney Function Lab Results  Component Value Date/Time   CREATININE 1.05 02/19/2020 01:32 PM   CREATININE 1.23 (H) 12/24/2019 03:03 PM   CREATININE 0.97 (H) 04/13/2015 02:42 PM   CREATININE 0.76 01/23/2015 04:35 PM   GFR 46.71 (L) 02/19/2020 01:32 PM   GFRNONAA 49 (L) 09/05/2019 05:26 AM   GFRAA 57 (L) 09/05/2019 05:26 AM    Current antihyperglycemic regimen:  Pioglitazone 15 mg - 1 tablet daily Januvia 100 mg - 1/2 tablet (50 mg) daily   Patient verbally confirms she  is taking the above medications as directed. Yes  What recent interventions/DTPs have been made to improve glycemic control:  none identified  Have there been any recent hospitalizations or ED visits since last visit with CPP? No  Patient denies hypoglycemic symptoms, including Pale, Sweaty, Shaky, Hungry, Nervous/irritable and Vision changes  Patient denies hyperglycemic symptoms, including blurry vision, excessive thirst, fatigue, polyuria and weakness  How often are you checking your blood sugar? Infrequently   What are your blood sugars ranging?  Fasting: n/a Before meals: n/a After meals: after Lunch 3:00pm   96 Bedtime: n/a  On insulin? No  During the week, how often does your blood glucose drop below 70? Never  Are you checking your feet daily/regularly? Yes  Adherence Review: Is the patient currently on a STATIN medication? Yes Is the patient currently on ACE/ARB medication? No Does the patient have >5 day gap between last estimated fill dates? No  Star Rating Drugs:  Medication:  Last Fill: Day Supply Pioglitazone 15 mg 07/08/2020 30ds Pravastatin 20 mg 05/07/2020 90ds januvia 100 mg 06/09/2020 60ds   Follow-Up:  Pharmacist Review  [name], CPP notified  Avel Sensor [facility] Clinical Pharmacy Assistant [phone]  I have reviewed the care management and care coordination activities outlined in this encounter and I am certifying that I agree with the content of this note. No further action required.  [name], PharmD Clinical Pharmacist [facility] [phone]

## 2020-07-29 ENCOUNTER — Encounter: Payer: Self-pay | Admitting: Family Medicine

## 2020-07-29 ENCOUNTER — Other Ambulatory Visit: Payer: Self-pay

## 2020-07-29 ENCOUNTER — Ambulatory Visit (INDEPENDENT_AMBULATORY_CARE_PROVIDER_SITE_OTHER): Payer: Medicare Other | Admitting: Family Medicine

## 2020-07-29 VITALS — BP 120/78 | HR 88 | Temp 97.8°F | Ht <= 58 in | Wt 139.2 lb

## 2020-07-29 DIAGNOSIS — H9193 Unspecified hearing loss, bilateral: Secondary | ICD-10-CM | POA: Diagnosis not present

## 2020-07-29 DIAGNOSIS — R2681 Unsteadiness on feet: Secondary | ICD-10-CM | POA: Diagnosis not present

## 2020-07-29 DIAGNOSIS — I5032 Chronic diastolic (congestive) heart failure: Secondary | ICD-10-CM | POA: Diagnosis not present

## 2020-07-29 DIAGNOSIS — I2510 Atherosclerotic heart disease of native coronary artery without angina pectoris: Secondary | ICD-10-CM

## 2020-07-29 DIAGNOSIS — Z79899 Other long term (current) drug therapy: Secondary | ICD-10-CM | POA: Diagnosis not present

## 2020-07-29 DIAGNOSIS — F411 Generalized anxiety disorder: Secondary | ICD-10-CM

## 2020-07-29 DIAGNOSIS — I872 Venous insufficiency (chronic) (peripheral): Secondary | ICD-10-CM

## 2020-07-29 DIAGNOSIS — E1122 Type 2 diabetes mellitus with diabetic chronic kidney disease: Secondary | ICD-10-CM

## 2020-07-29 DIAGNOSIS — I480 Paroxysmal atrial fibrillation: Secondary | ICD-10-CM | POA: Diagnosis not present

## 2020-07-29 DIAGNOSIS — G8929 Other chronic pain: Secondary | ICD-10-CM | POA: Diagnosis not present

## 2020-07-29 DIAGNOSIS — I10 Essential (primary) hypertension: Secondary | ICD-10-CM

## 2020-07-29 DIAGNOSIS — N183 Chronic kidney disease, stage 3 unspecified: Secondary | ICD-10-CM | POA: Diagnosis not present

## 2020-07-29 MED ORDER — SERTRALINE HCL 25 MG PO TABS: 25.0000 mg | ORAL_TABLET | Freq: Every day | ORAL | 6 refills | Status: DC

## 2020-07-29 MED ORDER — HYDROCODONE-ACETAMINOPHEN 5-325 MG PO TABS
0.5000 | ORAL_TABLET | Freq: Two times a day (BID) | ORAL | 0 refills | Status: DC | PRN
Start: 2020-07-29 — End: 2020-08-28

## 2020-07-29 NOTE — Assessment & Plan Note (Signed)
Seems euvolemic today.  Continue lasix 40mg  bid.

## 2020-07-29 NOTE — Assessment & Plan Note (Signed)
Encouraged increasing water intake.

## 2020-07-29 NOTE — Patient Instructions (Addendum)
Stay off trazodone.  Start sertraline 25mg  daily - this medicine will help anxiety. Take it daily for 1 month to see if it will help.  Ok to take xanax, but use only as needed.  Return in 3 months for follow up visit.  Labs today.

## 2020-07-29 NOTE — Progress Notes (Signed)
[name], female    DOB: [birth year], 87 y.o.   MRN: [id number]  This visit was conducted in person.  BP 120/78   Pulse 88   Temp 97.8 F (36.6 C) (Temporal)   Ht 4\' 8"  (1.422 m)   Wt 139 lb 4 oz (63.2 kg)   SpO2 100%   BMI 31.22 kg/m    Orthostatic VS for the past 24 hrs (Last 3 readings):  BP- Sitting BP- Standing at 3 minutes  07/29/20 1508 -- 120/64  07/29/20 1506 122/68 --   CC: 3 mo chronic pain visit  Subjective:   HPI: [name] is a 87 y.o. female presenting on 07/29/2020 for Chronic Pain Management (Here for 3 mo f/u.  Pt accompanied by friend, [name]- temp 97.9.)   Longstanding GAD - we took over trazodone 150mg  1/2 tab QHS and xanax 1mg  1/2 tab QID PRN. Previously prescribed by Noemi Chapel NP.   Hasn't felt well over the past 3 weeks. "My heart". Recent reassuring cardiac evaluation. Feels restless. She is only taking hydrocodone 1/2 tab BID.   She stopped trazodone due to excess sedation. Notes ongoing difficulty with anxiety.   She had a fall today at home while reaching for the counter.      Relevant past medical, surgical, family and social history reviewed and updated as indicated. Interim medical history since our last visit reviewed. Allergies and medications reviewed and updated. Outpatient Medications Prior to Visit  Medication Sig Dispense Refill  . ALPRAZolam (XANAX) 1 MG tablet Take 0.5 tablets (0.5 mg total) by mouth 4 (four) times daily as needed for anxiety. 30 tablet 3  . B Complex-C (B COMPLEX-VITAMIN C PO) Take 1 tablet by mouth daily. +zinc    . diclofenac sodium (VOLTAREN) 1 % GEL Apply 1 application topically 3 (three) times daily. 1 Tube 1  . DILT-XR 120 MG 24 hr capsule TAKE 1 CAPSULE BY MOUTH EVERY DAY 30 capsule 6  . ELIQUIS 5 MG TABS tablet TAKE 1 TABLET BY MOUTH TWICE A DAY 60 tablet 5  . famotidine (PEPCID) 20 MG tablet Take 1 tablet (20 mg total) by mouth daily as needed for heartburn (breakthrough  heartburn).    . fluticasone (FLONASE) 50 MCG/ACT nasal spray Place into both nostrils.    . furosemide (LASIX) 40 MG tablet TAKE 1 TABLET BY MOUTH TWICE A DAY 180 tablet 1  . glucosamine-chondroitin 500-400 MG tablet Take 1 tablet by mouth daily.    . Lancets (ONETOUCH DELICA PLUS [id number]) MISC Use to test blood sugar once daily dx code E11.9 100 each 1  . loperamide (IMODIUM) 2 MG capsule Take 2 mg by mouth 4 (four) times daily as needed for diarrhea or loose stools.    Marland Kitchen loratadine (CLARITIN) 10 MG tablet Take 10 mg by mouth daily as needed for allergies.    . metoprolol tartrate (LOPRESSOR) 50 MG tablet TAKE 1 TABLET BY MOUTH TWICE A DAY 180 tablet 3  . Multiple Vitamins-Minerals (ICAPS) CAPS Take 1 capsule by mouth daily.    . nitroGLYCERIN (NITROSTAT) 0.4 MG SL tablet Place 1 tablet (0.4 mg total) under the tongue every 5 (five) minutes as needed for chest pain. 25 tablet 3  . nystatin ointment (MYCOSTATIN) APPLY TO AFFECTED AREA TWICE DAILY 30 g 3  . ONETOUCH VERIO test strip USE TO TEST BLOOD SUGAR ONCE DAILY. DX:E11.9 50 strip 11  . pantoprazole (PROTONIX) 40 MG tablet TAKE 1 TABLET BY MOUTH TWICE  A DAY 180 tablet 3  . pioglitazone (ACTOS) 15 MG tablet TAKE 1 TABLET BY MOUTH EVERY DAY 30 tablet 6  . potassium chloride (MICRO-K) 10 MEQ CR capsule TAKE 1 CAPSULE BY MOUTH EVERY DAY 90 capsule 1  . pravastatin (PRAVACHOL) 20 MG tablet Take 1 tablet (20 mg total) by mouth daily. 90 tablet 3  . Probiotic Product (ALIGN) 4 MG CAPS Take 1 capsule (4 mg total) by mouth daily.    . sitaGLIPtin (JANUVIA) 100 MG tablet TAKE 1/2 TABLET BY MOUTH EVERY DAY 30 tablet 5  . vitamin E 1000 UNIT capsule Take 1,000 Units by mouth daily.    Marland Kitchen HYDROcodone-acetaminophen (NORCO/VICODIN) 5-325 MG tablet Take 0.5 tablets by mouth 2 (two) times daily as needed for moderate pain or severe pain. 30 tablet 0  . sulfamethoxazole-trimethoprim (BACTRIM DS) 800-160 MG tablet Take 1 tablet by mouth 2 (two) times daily. 6  tablet 0  . traZODone (DESYREL) 150 MG tablet Take 0.5 tablets (75 mg total) by mouth at bedtime. 45 tablet 3   No facility-administered medications prior to visit.     Per HPI unless specifically indicated in ROS section below Review of Systems Objective:  BP 120/78   Pulse 88   Temp 97.8 F (36.6 C) (Temporal)   Ht 4\' 8"  (1.422 m)   Wt 139 lb 4 oz (63.2 kg)   SpO2 100%   BMI 31.22 kg/m   Wt Readings from Last 3 Encounters:  07/29/20 139 lb 4 oz (63.2 kg)  06/30/20 133 lb (60.3 kg)  06/10/20 140 lb 8 oz (63.7 kg)      Physical Exam Vitals and nursing note reviewed.  Constitutional:      Appearance: Normal appearance. She is not ill-appearing.     Comments: Ambulates with walker  Cardiovascular:     Rate and Rhythm: Normal rate. Rhythm irregular.     Pulses: Normal pulses.     Heart sounds: Normal heart sounds. No murmur heard.   Pulmonary:     Effort: Pulmonary effort is normal. No respiratory distress.     Breath sounds: Normal breath sounds. No wheezing, rhonchi or rales.  Musculoskeletal:        General: Tenderness present.     Right lower leg: No edema.     Left lower leg: No edema.     Comments: Compression socks in place  Skin:    General: Skin is warm and dry.     Findings: No erythema or rash.  Neurological:     Mental Status: She is alert.  Psychiatric:        Mood and Affect: Mood is anxious.       Assessment & Plan:  This visit occurred during the SARS-CoV-2 public health emergency.  Safety protocols were in place, including screening questions prior to the visit, additional usage of staff PPE, and extensive cleaning of exam room while observing appropriate contact time as indicated for disinfecting solutions.   Problem List Items Addressed This Visit    GAD (generalized anxiety disorder)    She self stopped trazodone. Agree with staying off at this time due to increased sedation during the day. Will Rx sertraline 25mg  daily, continue xanax 1mg   1/2 tab QID PRN. Reassess at f/u visit.       Relevant Medications   sertraline (ZOLOFT) 25 MG tablet   Hypertension    Chronic, stable. Check orthostatics today.       (HFpEF) heart failure with preserved ejection fraction ([place])  Seems euvolemic today.  Continue lasix 40mg  bid.       Unsteady gait    Reassess off trazodone.       Chronic venous insufficiency   Paroxysmal atrial fibrillation (HCC)    Irregular today. Continue eliquis.       Encounter for chronic pain management - Primary    [place] CSRS reviewed Hydrocodone refilled. Last filled #30 05/2020.       Bilateral hearing loss   Polypharmacy   CKD stage 3 due to type 2 diabetes mellitus ([place])    Encouraged increasing water intake.       Relevant Orders   Renal function panel   CBC with Differential/Platelet   TSH       Meds ordered this encounter  Medications  . HYDROcodone-acetaminophen (NORCO/VICODIN) 5-325 MG tablet    Sig: Take 0.5 tablets by mouth 2 (two) times daily as needed for moderate pain or severe pain.    Dispense:  30 tablet    Refill:  0  . sertraline (ZOLOFT) 25 MG tablet    Sig: Take 1 tablet (25 mg total) by mouth daily.    Dispense:  30 tablet    Refill:  6   Orders Placed This Encounter  Procedures  . Renal function panel  . CBC with Differential/Platelet  . TSH    Patient Instructions  Stay off trazodone.  Start sertraline 25mg  daily - this medicine will help anxiety. Take it daily for 1 month to see if it will help.  Ok to take xanax, but use only as needed.  Return in 3 months for follow up visit.  Labs today.   Follow up plan: Return in about 3 months (around 10/28/2020) for follow up visit.  Ria Bush, MD

## 2020-07-29 NOTE — Assessment & Plan Note (Signed)
She self stopped trazodone. Agree with staying off at this time due to increased sedation during the day. Will Rx sertraline 25mg  daily, continue xanax 1mg  1/2 tab QID PRN. Reassess at f/u visit.

## 2020-07-29 NOTE — Assessment & Plan Note (Signed)
Reassess off trazodone.

## 2020-07-29 NOTE — Assessment & Plan Note (Signed)
Irregular today. Continue eliquis.

## 2020-07-29 NOTE — Assessment & Plan Note (Signed)
Chronic, stable. Check orthostatics today.

## 2020-07-29 NOTE — Assessment & Plan Note (Signed)
[place] CSRS reviewed Hydrocodone refilled. Last filled #30 05/2020.

## 2020-07-30 LAB — CBC WITH DIFFERENTIAL/PLATELET
Basophils Absolute: 0.1 10*3/uL (ref 0.0–0.1)
Basophils Relative: 1 % (ref 0.0–3.0)
Eosinophils Absolute: 0.2 10*3/uL (ref 0.0–0.7)
Eosinophils Relative: 1.7 % (ref 0.0–5.0)
HCT: 39.4 % (ref 36.0–46.0)
Hemoglobin: 13 g/dL (ref 12.0–15.0)
Lymphocytes Relative: 15.4 % (ref 12.0–46.0)
Lymphs Abs: 1.6 10*3/uL (ref 0.7–4.0)
MCHC: 33 g/dL (ref 30.0–36.0)
MCV: 87.8 fl (ref 78.0–100.0)
Monocytes Absolute: 1.1 10*3/uL — ABNORMAL HIGH (ref 0.1–1.0)
Monocytes Relative: 10.8 % (ref 3.0–12.0)
Neutro Abs: 7.3 10*3/uL (ref 1.4–7.7)
Neutrophils Relative %: 71.1 % (ref 43.0–77.0)
Platelets: 230 10*3/uL (ref 150.0–400.0)
RBC: 4.48 Mil/uL (ref 3.87–5.11)
RDW: 16.4 % — ABNORMAL HIGH (ref 11.5–15.5)
WBC: 10.3 10*3/uL (ref 4.0–10.5)

## 2020-07-30 LAB — RENAL FUNCTION PANEL
Albumin: 3.9 g/dL (ref 3.5–5.2)
BUN: 29 mg/dL — ABNORMAL HIGH (ref 6–23)
CO2: 33 mEq/L — ABNORMAL HIGH (ref 19–32)
Calcium: 9.3 mg/dL (ref 8.4–10.5)
Chloride: 100 mEq/L (ref 96–112)
Creatinine, Ser: 1.07 mg/dL (ref 0.40–1.20)
GFR: 45.53 mL/min — ABNORMAL LOW (ref >60.00)
Glucose, Bld: 84 mg/dL (ref 70–99)
Phosphorus: 3.6 mg/dL (ref 2.3–4.6)
Potassium: 4.3 mEq/L (ref 3.5–5.1)
Sodium: 141 mEq/L (ref 135–145)

## 2020-07-30 LAB — TSH: TSH: 3.6 u[IU]/mL (ref 0.35–4.50)

## 2020-08-12 ENCOUNTER — Telehealth: Payer: Self-pay | Admitting: Family Medicine

## 2020-08-12 NOTE — Telephone Encounter (Signed)
[name] called in wanted to know about what she is taking for her heart, and wanted to check on her appointment due to it is stating to return in 6-8 weeks and it saying to come in 6/8 but I looked at her appointment and it says 8/2 @230 

## 2020-08-12 NOTE — Telephone Encounter (Signed)
Returned pt's call.  She wanted to confirm her next OV with Dr. Darnell Level.  Thinks it was due in 6 wks.  Informed her she is scheduled to see Dr. Darnell Level on 11/03/20 at 2:30.  Says she may have another appt that day.  I explained she can call back to r/s if needed.  Pt verbalizes understanding.   Also, reviewed BP/heart meds.  I reminded her cards is prescribing some of them.  States she knows and she was calling them next.

## 2020-08-19 ENCOUNTER — Other Ambulatory Visit: Payer: Self-pay | Admitting: Family Medicine

## 2020-08-19 NOTE — Telephone Encounter (Signed)
Pharmacy requests refill on: Alprazolam 1 mg   LAST REFILL: 04/27/2020 (Q-30, R-3) LAST OV: 07/29/2020 NEXT OV: 11/03/2020 PHARMACY: [facility] [place], [place]

## 2020-08-21 ENCOUNTER — Other Ambulatory Visit: Payer: Self-pay | Admitting: Family Medicine

## 2020-08-21 NOTE — Telephone Encounter (Signed)
ERx 

## 2020-08-26 ENCOUNTER — Telehealth: Payer: Self-pay | Admitting: *Deleted

## 2020-08-26 NOTE — Telephone Encounter (Signed)
See my note below. May offer appt Friday

## 2020-08-26 NOTE — Telephone Encounter (Signed)
Patient called because she was having trouble breathing and was transferred to Access Nurse. Access Nurse called the office back and stated that their advice to patient was for her to go to the ER or UC and patient refused. Called and spoke to patient and was advised that she is having SOB and tremors at times. Patient stated that she can just be sitting around and the spell just comes on her. Patient stated that she does not have to be doing anything other than sitting when she has SOB. Patient denies a cough. Patient stated that she is not going to an ER and sit around for hours with sick people. Patient was asked if she would at least go to an UC? Patient stated that she is not going anywhere other than she will agree to see Dr. Danise Mina. Patient stated that she is getting aggravated with people trying to get her to go to an UC or ER. Patient stated that she was hanging up and to have a good day. Patient hung the phone up.

## 2020-08-26 NOTE — Telephone Encounter (Signed)
[facility] - Client TELEPHONE ADVICE RECORD AccessNurse Patient Name: [name] TLE Gender: Female DOB: [birth year] Age: 87 Y 2 M 20 D Return Phone Number: [phone] (Primary) Address: [place]/ State/ Zip: [place] [address] Client [facility] [facility] - Client Client Site [place] - Day Physician Ria Bush - MD Contact Type Call Who Is Calling Patient / Member / Family / Caregiver Call Type Triage / Clinical Relationship To Patient Self Return Phone Number [phone] (Primary) Chief Complaint BREATHING - shortness of breath or sounds breathless Reason for Call Symptomatic / Request for Health Information Initial Comment When sitting or doing anything she gets a feeling of having trouble breathing, last 30-45 min Translation No Nurse Assessment Nurse: Zorita Pang, RN, Neoma Laming Date/Time (Eastern Time): 08/26/2020 12:38:11 PM Confirm and document reason for call. If symptomatic, describe symptoms. ---Caller states that she is having trouble breathing. Feels weak. Can't describe it. Will last 30 to 45 minutes. Does the patient have any new or worsening symptoms? ---Yes Will a triage be completed? ---Yes Related visit to physician within the last 2 weeks? ---No Does the PT have any chronic conditions? (i.e. diabetes, asthma, this includes High risk factors for pregnancy, etc.) ---Yes List chronic conditions. ---diabetes, hypertension Is this a behavioral health or substance abuse call? ---No Guidelines Guideline Title Affirmed Question Affirmed Notes Nurse Date/Time Eilene Ghazi Time) Breathing Difficulty Extra heart beats OR irregular heart beating (i.e., "palpitations") Womble, RN, Neoma Laming 08/26/2020 12:40:02 PM Disp. Time Eilene Ghazi Time) Disposition Final User 08/26/2020 12:36:55 PM Send to Urgent Queue Donato Heinz 08/26/2020 12:48:49 PM Go to ED Now Yes Zorita Pang,  RN, Neoma Laming PLEASE NOTE: All timestamps contained within this report are represented as [place] Standard Time. CONFIDENTIALTY NOTICE: This fax transmission is intended only for the addressee. It contains information that is legally privileged, confidential or otherwise protected from use or disclosure. If you are not the intended recipient, you are strictly prohibited from reviewing, disclosing, copying using or disseminating any of this information or taking any action in reliance on or regarding this information. If you have received this fax in error, please notify us immediately by telephone so that we can arrange for its return to [place]. Phone: [phone], Toll-Free: [phone], Fax: [phone] Page: 2 of 2 Call Id: [id number] [place] Disagree/Comply Disagree Caller Understands Yes PreDisposition Go to ED Care Advice Given Per Guideline GO TO ED NOW: Comments User: Marquis Buggy, RN Date/Time Eilene Ghazi Time): 08/26/2020 12:50:08 PM Caller refuses to go to ED. Called the backline at her insistence. Spoke with staff member Raquel Sarna and she states that she will have one of the office nurses contact the caller. The caller was informed to expect a callback and she verbalized understanding. Referrals Warm transfer to backline

## 2020-08-26 NOTE — Telephone Encounter (Signed)
We have made medical recommendation, but can't force patient to follow those recommendations. I am out of office tomorrow.  May offer appt Friday 12:30pm with me if desired for further evaluation of dyspnea.

## 2020-08-27 NOTE — Telephone Encounter (Signed)
Sent teams message to Randall An and Donzetta Matters to Schedule her 12:30 pm on 08/28/20 Ok'd by Dr Darnell Level to add.

## 2020-08-28 ENCOUNTER — Ambulatory Visit (INDEPENDENT_AMBULATORY_CARE_PROVIDER_SITE_OTHER): Payer: Medicare Other | Admitting: Family Medicine

## 2020-08-28 ENCOUNTER — Other Ambulatory Visit: Payer: Self-pay

## 2020-08-28 ENCOUNTER — Encounter: Payer: Self-pay | Admitting: Family Medicine

## 2020-08-28 VITALS — BP 130/64 | HR 83 | Temp 98.0°F | Ht <= 58 in | Wt 137.5 lb

## 2020-08-28 DIAGNOSIS — I2510 Atherosclerotic heart disease of native coronary artery without angina pectoris: Secondary | ICD-10-CM

## 2020-08-28 DIAGNOSIS — M79605 Pain in left leg: Secondary | ICD-10-CM | POA: Diagnosis not present

## 2020-08-28 DIAGNOSIS — E1151 Type 2 diabetes mellitus with diabetic peripheral angiopathy without gangrene: Secondary | ICD-10-CM | POA: Diagnosis not present

## 2020-08-28 DIAGNOSIS — I5032 Chronic diastolic (congestive) heart failure: Secondary | ICD-10-CM | POA: Diagnosis not present

## 2020-08-28 DIAGNOSIS — R0602 Shortness of breath: Secondary | ICD-10-CM | POA: Diagnosis not present

## 2020-08-28 DIAGNOSIS — M79604 Pain in right leg: Secondary | ICD-10-CM

## 2020-08-28 DIAGNOSIS — F411 Generalized anxiety disorder: Secondary | ICD-10-CM | POA: Diagnosis not present

## 2020-08-28 DIAGNOSIS — G8929 Other chronic pain: Secondary | ICD-10-CM

## 2020-08-28 DIAGNOSIS — Z79899 Other long term (current) drug therapy: Secondary | ICD-10-CM | POA: Diagnosis not present

## 2020-08-28 LAB — POCT GLYCOSYLATED HEMOGLOBIN (HGB A1C): Hemoglobin A1C: 7.3 % — AB (ref 4.0–5.6)

## 2020-08-28 MED ORDER — SERTRALINE HCL 50 MG PO TABS: 50.0000 mg | ORAL_TABLET | Freq: Every day | ORAL | 6 refills | Status: DC

## 2020-08-28 MED ORDER — HYDROCODONE-ACETAMINOPHEN 5-325 MG PO TABS: 0.5000 | ORAL_TABLET | Freq: Two times a day (BID) | ORAL | 0 refills | Status: DC | PRN

## 2020-08-28 NOTE — Progress Notes (Signed)
[name], female    DOB: 1927-04-20, 87 y.o.   MRN: [id number]  This visit was conducted in person.  BP 130/64   Pulse 83   Temp 98 F (36.7 C) (Temporal)   Ht 4\' 8"  (1.422 m)   Wt 137 lb 8 oz (62.4 kg)   SpO2 96%   BMI 30.83 kg/m    CC: dyspnea intermittent Subjective:   HPI: [name] is a 87 y.o. female presenting on 08/28/2020 for Shortness of Breath (C/o occasional SOB.  Feels episodes of rapid HR and SOB.  Pt accompanied by friend, [name]- temp 97.9.)   Known longstanding GAD on trazodone 150mg  1/2 tab QHS and xanax 1mg  1/2 tab QID PRN. She actually stopped trazodone due to excess sedation. Last visit we started sertraline 25mg  daily.   Known parox afib on eliquis, HFpEF on lasix 40mg  bid. She also continues diltiazem XR 120mg  daily. Continues feeling poorly over the past 1-2 months - last had cardiac evaluation late 06/2020. H/o abnormal stress myoview - she declined catheterization. Sleeping in recliner.   Having episodes of racing heart associated with headaches and dizziness, episodes can last 30-45 minutes. No chest pain or significant cough, headache.      Relevant past medical, surgical, family and social history reviewed and updated as indicated. Interim medical history since our last visit reviewed. Allergies and medications reviewed and updated. Outpatient Medications Prior to Visit  Medication Sig Dispense Refill  . ALPRAZolam (XANAX) 1 MG tablet TAKE 1/2 TABLET 4 TIMES A DAY AS NEEDED ANXIETY 30 tablet 3  . B Complex-C (B COMPLEX-VITAMIN C PO) Take 1 tablet by mouth daily. +zinc    . diclofenac sodium (VOLTAREN) 1 % GEL Apply 1 application topically 3 (three) times daily. 1 Tube 1  . DILT-XR 120 MG 24 hr capsule TAKE 1 CAPSULE BY MOUTH EVERY DAY 30 capsule 6  . ELIQUIS 5 MG TABS tablet TAKE 1 TABLET BY MOUTH TWICE A DAY 60 tablet 5  . famotidine (PEPCID) 20 MG tablet Take 1 tablet (20 mg total) by mouth daily as needed for heartburn  (breakthrough heartburn).    . fluticasone (FLONASE) 50 MCG/ACT nasal spray Place into both nostrils.    . furosemide (LASIX) 40 MG tablet TAKE ONE TABLET BY MOUTH TWICE DAILY 180 tablet 1  . glucosamine-chondroitin 500-400 MG tablet Take 1 tablet by mouth daily.    . Lancets (ONETOUCH DELICA PLUS [id number]) MISC Use to test blood sugar once daily dx code E11.9 100 each 1  . loperamide (IMODIUM) 2 MG capsule Take 2 mg by mouth 4 (four) times daily as needed for diarrhea or loose stools.    Marland Kitchen loratadine (CLARITIN) 10 MG tablet Take 10 mg by mouth daily as needed for allergies.    . metoprolol tartrate (LOPRESSOR) 50 MG tablet TAKE 1 TABLET BY MOUTH TWICE A DAY 180 tablet 3  . Multiple Vitamins-Minerals (ICAPS) CAPS Take 1 capsule by mouth daily.    . nitroGLYCERIN (NITROSTAT) 0.4 MG SL tablet Place 1 tablet (0.4 mg total) under the tongue every 5 (five) minutes as needed for chest pain. 25 tablet 3  . nystatin ointment (MYCOSTATIN) APPLY TO AFFECTED AREA TWICE DAILY 30 g 3  . ONETOUCH VERIO test strip USE TO TEST BLOOD SUGAR ONCE DAILY. DX:E11.9 50 strip 11  . pantoprazole (PROTONIX) 40 MG tablet TAKE 1 TABLET BY MOUTH TWICE A DAY 180 tablet 3  . pioglitazone (ACTOS) 15 MG tablet TAKE  1 TABLET BY MOUTH EVERY DAY 30 tablet 6  . potassium chloride (MICRO-K) 10 MEQ CR capsule TAKE ONE CAPSULE BY MOUTH ONCE DAILY 90 capsule 1  . pravastatin (PRAVACHOL) 20 MG tablet Take 1 tablet (20 mg total) by mouth daily. 90 tablet 3  . Probiotic Product (ALIGN) 4 MG CAPS Take 1 capsule (4 mg total) by mouth daily.    . sitaGLIPtin (JANUVIA) 100 MG tablet TAKE 1/2 TABLET BY MOUTH EVERY DAY 30 tablet 5  . vitamin E 1000 UNIT capsule Take 1,000 Units by mouth daily.    Marland Kitchen HYDROcodone-acetaminophen (NORCO/VICODIN) 5-325 MG tablet Take 0.5 tablets by mouth 2 (two) times daily as needed for moderate pain or severe pain. 30 tablet 0  . sertraline (ZOLOFT) 25 MG tablet Take 1 tablet (25 mg total) by mouth daily. 30 tablet  6   No facility-administered medications prior to visit.     Per HPI unless specifically indicated in ROS section below Review of Systems Objective:  BP 130/64   Pulse 83   Temp 98 F (36.7 C) (Temporal)   Ht 4\' 8"  (1.422 m)   Wt 137 lb 8 oz (62.4 kg)   SpO2 96%   BMI 30.83 kg/m   Wt Readings from Last 3 Encounters:  08/28/20 137 lb 8 oz (62.4 kg)  07/29/20 139 lb 4 oz (63.2 kg)  06/30/20 133 lb (60.3 kg)      Physical Exam Vitals and nursing note reviewed.  Constitutional:      Appearance: Normal appearance. She is not ill-appearing.  HENT:     Ears:     Comments: Very hard of hearing despite hearing aide Cardiovascular:     Rate and Rhythm: Normal rate. Rhythm irregularly irregular.     Pulses: Normal pulses.     Heart sounds: Normal heart sounds.  Pulmonary:     Effort: Pulmonary effort is normal. No respiratory distress.     Breath sounds: Normal breath sounds. No wheezing, rhonchi or rales.  Musculoskeletal:     Cervical back: Normal range of motion and neck supple. No rigidity.     Right lower leg: No edema.     Left lower leg: No edema.     Comments: Compression stockings in place  Skin:    General: Skin is warm and dry.     Findings: No erythema or rash.  Neurological:     Mental Status: She is alert.  Psychiatric:        Attention and Perception: Attention normal.        Mood and Affect: Mood is anxious.       Results for orders placed or performed in visit on 08/28/20  POCT glycosylated hemoglobin (Hb A1C)  Result Value Ref Range   Hemoglobin A1C 7.3 (A) 4.0 - 5.6 %   HbA1c POC (<> result, manual entry)     HbA1c, POC (prediabetic range)     HbA1c, POC (controlled diabetic range)     *Note: Due to a large number of results and/or encounters for the requested time period, some results have not been displayed. A complete set of results can be found in Results Review.   Lab Results  Component Value Date   CREATININE 1.07 07/29/2020   BUN 29 (H)  07/29/2020   NA 141 07/29/2020   K 4.3 07/29/2020   CL 100 07/29/2020   CO2 33 (H) 07/29/2020    Lab Results  Component Value Date   WBC 10.3 07/29/2020   HGB  13.0 07/29/2020   HCT 39.4 07/29/2020   MCV 87.8 07/29/2020   PLT 230.0 07/29/2020    EKG - atrial fibrillation with coupling, normal axis, intervals, no hypertrophy or acute ST/T changes.   Assessment & Plan:  This visit occurred during the SARS-CoV-2 public health emergency.  Safety protocols were in place, including screening questions prior to the visit, additional usage of staff PPE, and extensive cleaning of exam room while observing appropriate contact time as indicated for disinfecting solutions.   Problem List Items Addressed This Visit    Coronary artery disease   GAD (generalized anxiety disorder)    On sertraline 25mg  daily with xanax sparingly - recommend increasing dose to 50mg  - new dose sent to pharmacy. She now states she continues trazodone 1/2-1/4 tab nightly PRN sleep.       Relevant Medications   sertraline (ZOLOFT) 50 MG tablet   (HFpEF) heart failure with preserved ejection fraction (HCC)    Seems euvolemic today on lasix 40mg  bid.  Check EKG today for endorsed dyspnea - overall stable.  Doubt current episodes cardiac in nature.       Diabetes mellitus type 2 with peripheral artery disease (HCC)    Check A1c today - overall stable.      Relevant Orders   POCT glycosylated hemoglobin (Hb A1C) (Completed)   Encounter for chronic pain management    Reviewed opiate use - she uses sparingly for leg pain.       Chronic leg pain    Manages with PRN hydrocodone.       Relevant Medications   sertraline (ZOLOFT) 50 MG tablet   HYDROcodone-acetaminophen (NORCO/VICODIN) 5-325 MG tablet   Dyspnea - Primary    Describes episodes of dyspnea associated with racing heart, headache and dizziness that can last 30+ minutes. Exam overall stable today. Check EKG. ?component of anxiety - will increase  sertraline dose. Update with effect.       Relevant Orders   EKG 12-Lead (Completed)   Polypharmacy       Meds ordered this encounter  Medications  . sertraline (ZOLOFT) 50 MG tablet    Sig: Take 1 tablet (50 mg total) by mouth daily.    Dispense:  30 tablet    Refill:  6    Note new dose  . HYDROcodone-acetaminophen (NORCO/VICODIN) 5-325 MG tablet    Sig: Take 0.5 tablets by mouth 2 (two) times daily as needed for moderate pain or severe pain.    Dispense:  30 tablet    Refill:  0   Orders Placed This Encounter  Procedures  . POCT glycosylated hemoglobin (Hb A1C)  . EKG 12-Lead    Patient Instructions  EKG looking ok today Sugar levels ok.  Continue current medicines, increase sertraline to 50mg  daily - new dose sent to pharmacy.  Take diltiazem early in the morning.    Follow up plan: Return if symptoms worsen or fail to improve.  Ria Bush, MD

## 2020-08-28 NOTE — Patient Instructions (Addendum)
EKG looking ok today Sugar levels ok.  Continue current medicines, increase sertraline to 50mg  daily - new dose sent to pharmacy.  Take diltiazem early in the morning.

## 2020-08-29 NOTE — Assessment & Plan Note (Signed)
Manages with PRN hydrocodone.

## 2020-08-29 NOTE — Assessment & Plan Note (Addendum)
Describes episodes of dyspnea associated with racing heart, headache and dizziness that can last 30+ minutes. Exam overall stable today. Check EKG. ?component of anxiety - will increase sertraline dose. Update with effect.

## 2020-08-29 NOTE — Assessment & Plan Note (Signed)
Reviewed opiate use - she uses sparingly for leg pain.

## 2020-08-29 NOTE — Assessment & Plan Note (Addendum)
On sertraline 25mg  daily with xanax sparingly - recommend increasing dose to 50mg  - new dose sent to pharmacy. She now states she continues trazodone 1/2-1/4 tab nightly PRN sleep.

## 2020-08-29 NOTE — Assessment & Plan Note (Signed)
Seems euvolemic today on lasix 40mg  bid.  Check EKG today for endorsed dyspnea - overall stable.  Doubt current episodes cardiac in nature.

## 2020-08-29 NOTE — Assessment & Plan Note (Addendum)
Check A1c today - overall stable.

## 2020-09-03 ENCOUNTER — Ambulatory Visit (INDEPENDENT_AMBULATORY_CARE_PROVIDER_SITE_OTHER): Payer: Medicare Other | Admitting: Dermatology

## 2020-09-03 ENCOUNTER — Other Ambulatory Visit: Payer: Self-pay

## 2020-09-03 ENCOUNTER — Encounter: Payer: Self-pay | Admitting: Dermatology

## 2020-09-03 DIAGNOSIS — L821 Other seborrheic keratosis: Secondary | ICD-10-CM

## 2020-09-03 DIAGNOSIS — Z872 Personal history of diseases of the skin and subcutaneous tissue: Secondary | ICD-10-CM | POA: Diagnosis not present

## 2020-09-03 DIAGNOSIS — D485 Neoplasm of uncertain behavior of skin: Secondary | ICD-10-CM

## 2020-09-03 DIAGNOSIS — L578 Other skin changes due to chronic exposure to nonionizing radiation: Secondary | ICD-10-CM | POA: Diagnosis not present

## 2020-09-03 DIAGNOSIS — Z85828 Personal history of other malignant neoplasm of skin: Secondary | ICD-10-CM | POA: Diagnosis not present

## 2020-09-03 DIAGNOSIS — Z23 Encounter for immunization: Secondary | ICD-10-CM | POA: Diagnosis not present

## 2020-09-03 DIAGNOSIS — L570 Actinic keratosis: Secondary | ICD-10-CM | POA: Diagnosis not present

## 2020-09-03 DIAGNOSIS — R238 Other skin changes: Secondary | ICD-10-CM | POA: Diagnosis not present

## 2020-09-03 NOTE — Progress Notes (Signed)
Follow-Up Visit   Subjective  [name] is a 87 y.o. female who presents for the following: Actinic Keratosis (Patient is here today for treatment of the L nasal tip today with LN2 and to recheck ). She is also here today for [facility] site recheck S/P ED&C on the L forearm.   The following portions of the chart were reviewed this encounter and updated as appropriate:   Tobacco  Allergies  Meds  Problems  Med Hx  Surg Hx  Fam Hx      Review of Systems:  No other skin or systemic complaints except as noted in HPI or Assessment and Plan.  Objective  Well appearing patient in no apparent distress; mood and affect are within normal limits.  A focused examination was performed including the face and arms. Relevant physical exam findings are noted in the Assessment and Plan.  R forearm x 1 Clear.   L nasal tip x 1 Erythematous thin papules/macules with gritty scale.   Lower lip Purple macule.  L upper arm Excoriation.  Assessment & Plan  History of actinic keratosis R forearm x 1  Clear. Observe for recurrence. Call clinic for new or changing lesions.  Recommend regular skin exams, daily broad-spectrum spf 30+ sunscreen use, and photoprotection.     AK (actinic keratosis) L nasal tip x 1  Prior to procedure, discussed risks of blister formation, small wound, skin dyspigmentation, or rare scar following cryotherapy. Recommend Vaseline ointment to treated areas while healing.  Actinic keratoses are precancerous spots that appear secondary to cumulative UV radiation exposure/sun exposure over time. They are chronic with expected duration over 1 year. A portion of actinic keratoses will progress to squamous cell carcinoma of the skin. It is not possible to reliably predict which spots will progress to skin cancer and so treatment is recommended to prevent development of skin cancer.  Recommend daily broad spectrum sunscreen SPF 30+ to sun-exposed areas, reapply every 2  hours as needed.  Recommend staying in the shade or wearing long sleeves, sun glasses (UVA+UVB protection) and wide brim hats (4-inch brim around the entire circumference of the hat). Call for new or changing lesions.   Destruction of lesion - L nasal tip x 1 Complexity: simple   Destruction method: cryotherapy   Informed consent: discussed and consent obtained   Timeout:  patient name, date of birth, surgical site, and procedure verified Lesion destroyed using liquid nitrogen: Yes   Region frozen until ice ball extended beyond lesion: Yes   Outcome: patient tolerated procedure well with no complications   Post-procedure details: wound care instructions given    Venous lake of lip Lower lip  Benign-appearing.  Observation.  Call clinic for new or changing lesions.    Neoplasm of uncertain behavior of skin L upper arm  Excoriated SK vs AK - Patient deferred treatment at this time. Recheck at follow up appointment. Call for changes or worsening.    History of Basal Cell Carcinoma of the Skin - L forearm S/P ED&C - No evidence of recurrence today - Recommend regular full body skin exams - Recommend daily broad spectrum sunscreen SPF 30+ to sun-exposed areas, reapply every 2 hours as needed.  - Call if any new or changing lesions are noted between office visits  Seborrheic Keratoses - Stuck-on, waxy, tan-brown papules and/or plaques  - Benign-appearing - Discussed benign etiology and prognosis. - Observe - Call for any changes  Actinic Damage - chronic, secondary to cumulative UV radiation  exposure/sun exposure over time - diffuse scaly erythematous macules with underlying dyspigmentation - Recommend daily broad spectrum sunscreen SPF 30+ to sun-exposed areas, reapply every 2 hours as needed.  - Recommend staying in the shade or wearing long sleeves, sun glasses (UVA+UVB protection) and wide brim hats (4-inch brim around the entire circumference of the hat). - Call for new or  changing lesions.  Return for for TBSE in 2-3 mths for hx of AK's and BCC's.  Luther Redo, CMA, am acting as scribe for Forest Gleason, MD .  Documentation: I have reviewed the above documentation for accuracy and completeness, and I agree with the above.  Forest Gleason, MD

## 2020-09-03 NOTE — Patient Instructions (Addendum)

## 2020-09-05 ENCOUNTER — Emergency Department (HOSPITAL_COMMUNITY): Payer: Medicare Other

## 2020-09-05 ENCOUNTER — Other Ambulatory Visit: Payer: Self-pay

## 2020-09-05 ENCOUNTER — Inpatient Hospital Stay (HOSPITAL_COMMUNITY)
Admission: EM | Admit: 2020-09-05 | Discharge: 2020-09-14 | DRG: 481 | Disposition: A | Discharge status: Skilled Nursing Facility | Payer: Medicare Other | Attending: Family Medicine | Admitting: Family Medicine

## 2020-09-05 ENCOUNTER — Encounter (HOSPITAL_COMMUNITY): Payer: Self-pay

## 2020-09-05 DIAGNOSIS — Z9049 Acquired absence of other specified parts of digestive tract: Secondary | ICD-10-CM

## 2020-09-05 DIAGNOSIS — S32512A Fracture of superior rim of left pubis, initial encounter for closed fracture: Secondary | ICD-10-CM | POA: Diagnosis not present

## 2020-09-05 DIAGNOSIS — D631 Anemia in chronic kidney disease: Secondary | ICD-10-CM | POA: Diagnosis present

## 2020-09-05 DIAGNOSIS — M25572 Pain in left ankle and joints of left foot: Secondary | ICD-10-CM | POA: Diagnosis not present

## 2020-09-05 DIAGNOSIS — R0602 Shortness of breath: Secondary | ICD-10-CM

## 2020-09-05 DIAGNOSIS — F411 Generalized anxiety disorder: Secondary | ICD-10-CM | POA: Diagnosis present

## 2020-09-05 DIAGNOSIS — R918 Other nonspecific abnormal finding of lung field: Secondary | ICD-10-CM | POA: Diagnosis not present

## 2020-09-05 DIAGNOSIS — R531 Weakness: Secondary | ICD-10-CM | POA: Diagnosis not present

## 2020-09-05 DIAGNOSIS — E1151 Type 2 diabetes mellitus with diabetic peripheral angiopathy without gangrene: Secondary | ICD-10-CM | POA: Diagnosis present

## 2020-09-05 DIAGNOSIS — W1830XA Fall on same level, unspecified, initial encounter: Secondary | ICD-10-CM | POA: Diagnosis present

## 2020-09-05 DIAGNOSIS — D509 Iron deficiency anemia, unspecified: Secondary | ICD-10-CM | POA: Diagnosis not present

## 2020-09-05 DIAGNOSIS — M25551 Pain in right hip: Secondary | ICD-10-CM

## 2020-09-05 DIAGNOSIS — Z8673 Personal history of transient ischemic attack (TIA), and cerebral infarction without residual deficits: Secondary | ICD-10-CM

## 2020-09-05 DIAGNOSIS — Z743 Need for continuous supervision: Secondary | ICD-10-CM | POA: Diagnosis not present

## 2020-09-05 DIAGNOSIS — K219 Gastro-esophageal reflux disease without esophagitis: Secondary | ICD-10-CM | POA: Diagnosis present

## 2020-09-05 DIAGNOSIS — F32A Depression, unspecified: Secondary | ICD-10-CM | POA: Diagnosis present

## 2020-09-05 DIAGNOSIS — S72001D Fracture of unspecified part of neck of right femur, subsequent encounter for closed fracture with routine healing: Secondary | ICD-10-CM | POA: Diagnosis not present

## 2020-09-05 DIAGNOSIS — E1122 Type 2 diabetes mellitus with diabetic chronic kidney disease: Secondary | ICD-10-CM | POA: Diagnosis present

## 2020-09-05 DIAGNOSIS — J9811 Atelectasis: Secondary | ICD-10-CM | POA: Diagnosis not present

## 2020-09-05 DIAGNOSIS — R1311 Dysphagia, oral phase: Secondary | ICD-10-CM | POA: Diagnosis not present

## 2020-09-05 DIAGNOSIS — I4892 Unspecified atrial flutter: Secondary | ICD-10-CM | POA: Diagnosis not present

## 2020-09-05 DIAGNOSIS — J90 Pleural effusion, not elsewhere classified: Secondary | ICD-10-CM | POA: Diagnosis not present

## 2020-09-05 DIAGNOSIS — W19XXXA Unspecified fall, initial encounter: Secondary | ICD-10-CM | POA: Diagnosis not present

## 2020-09-05 DIAGNOSIS — Z951 Presence of aortocoronary bypass graft: Secondary | ICD-10-CM | POA: Diagnosis not present

## 2020-09-05 DIAGNOSIS — Z8711 Personal history of peptic ulcer disease: Secondary | ICD-10-CM | POA: Diagnosis not present

## 2020-09-05 DIAGNOSIS — S72141A Displaced intertrochanteric fracture of right femur, initial encounter for closed fracture: Principal | ICD-10-CM | POA: Diagnosis present

## 2020-09-05 DIAGNOSIS — Z79899 Other long term (current) drug therapy: Secondary | ICD-10-CM

## 2020-09-05 DIAGNOSIS — I130 Hypertensive heart and chronic kidney disease with heart failure and stage 1 through stage 4 chronic kidney disease, or unspecified chronic kidney disease: Secondary | ICD-10-CM | POA: Diagnosis not present

## 2020-09-05 DIAGNOSIS — Z7901 Long term (current) use of anticoagulants: Secondary | ICD-10-CM

## 2020-09-05 DIAGNOSIS — I480 Paroxysmal atrial fibrillation: Secondary | ICD-10-CM | POA: Diagnosis present

## 2020-09-05 DIAGNOSIS — M8000XA Age-related osteoporosis with current pathological fracture, unspecified site, initial encounter for fracture: Secondary | ICD-10-CM | POA: Diagnosis not present

## 2020-09-05 DIAGNOSIS — I2510 Atherosclerotic heart disease of native coronary artery without angina pectoris: Secondary | ICD-10-CM | POA: Diagnosis present

## 2020-09-05 DIAGNOSIS — R0902 Hypoxemia: Secondary | ICD-10-CM | POA: Diagnosis present

## 2020-09-05 DIAGNOSIS — S72141D Displaced intertrochanteric fracture of right femur, subsequent encounter for closed fracture with routine healing: Secondary | ICD-10-CM | POA: Diagnosis not present

## 2020-09-05 DIAGNOSIS — Z8249 Family history of ischemic heart disease and other diseases of the circulatory system: Secondary | ICD-10-CM

## 2020-09-05 DIAGNOSIS — N1831 Chronic kidney disease, stage 3a: Secondary | ICD-10-CM | POA: Diagnosis not present

## 2020-09-05 DIAGNOSIS — M810 Age-related osteoporosis without current pathological fracture: Secondary | ICD-10-CM | POA: Diagnosis not present

## 2020-09-05 DIAGNOSIS — R5381 Other malaise: Secondary | ICD-10-CM | POA: Diagnosis not present

## 2020-09-05 DIAGNOSIS — Z90710 Acquired absence of both cervix and uterus: Secondary | ICD-10-CM

## 2020-09-05 DIAGNOSIS — Y92009 Unspecified place in unspecified non-institutional (private) residence as the place of occurrence of the external cause: Secondary | ICD-10-CM | POA: Diagnosis not present

## 2020-09-05 DIAGNOSIS — R279 Unspecified lack of coordination: Secondary | ICD-10-CM | POA: Diagnosis not present

## 2020-09-05 DIAGNOSIS — R002 Palpitations: Secondary | ICD-10-CM | POA: Diagnosis not present

## 2020-09-05 DIAGNOSIS — M6281 Muscle weakness (generalized): Secondary | ICD-10-CM | POA: Diagnosis not present

## 2020-09-05 DIAGNOSIS — Z888 Allergy status to other drugs, medicaments and biological substances status: Secondary | ICD-10-CM

## 2020-09-05 DIAGNOSIS — Z86718 Personal history of other venous thrombosis and embolism: Secondary | ICD-10-CM

## 2020-09-05 DIAGNOSIS — M80051A Age-related osteoporosis with current pathological fracture, right femur, initial encounter for fracture: Secondary | ICD-10-CM | POA: Diagnosis present

## 2020-09-05 DIAGNOSIS — E785 Hyperlipidemia, unspecified: Secondary | ICD-10-CM | POA: Diagnosis present

## 2020-09-05 DIAGNOSIS — E1169 Type 2 diabetes mellitus with other specified complication: Secondary | ICD-10-CM | POA: Diagnosis not present

## 2020-09-05 DIAGNOSIS — Z20822 Contact with and (suspected) exposure to covid-19: Secondary | ICD-10-CM | POA: Diagnosis not present

## 2020-09-05 DIAGNOSIS — Z7984 Long term (current) use of oral hypoglycemic drugs: Secondary | ICD-10-CM

## 2020-09-05 DIAGNOSIS — S72009A Fracture of unspecified part of neck of unspecified femur, initial encounter for closed fracture: Secondary | ICD-10-CM | POA: Diagnosis present

## 2020-09-05 DIAGNOSIS — I5032 Chronic diastolic (congestive) heart failure: Secondary | ICD-10-CM | POA: Diagnosis not present

## 2020-09-05 DIAGNOSIS — R52 Pain, unspecified: Secondary | ICD-10-CM | POA: Diagnosis not present

## 2020-09-05 DIAGNOSIS — Z419 Encounter for procedure for purposes other than remedying health state, unspecified: Secondary | ICD-10-CM

## 2020-09-05 DIAGNOSIS — R079 Chest pain, unspecified: Secondary | ICD-10-CM | POA: Diagnosis not present

## 2020-09-05 DIAGNOSIS — I7025 Atherosclerosis of native arteries of other extremities with ulceration: Secondary | ICD-10-CM | POA: Diagnosis present

## 2020-09-05 DIAGNOSIS — H9193 Unspecified hearing loss, bilateral: Secondary | ICD-10-CM | POA: Diagnosis present

## 2020-09-05 DIAGNOSIS — E1142 Type 2 diabetes mellitus with diabetic polyneuropathy: Secondary | ICD-10-CM | POA: Diagnosis not present

## 2020-09-05 DIAGNOSIS — Z833 Family history of diabetes mellitus: Secondary | ICD-10-CM

## 2020-09-05 DIAGNOSIS — D649 Anemia, unspecified: Secondary | ICD-10-CM | POA: Diagnosis not present

## 2020-09-05 DIAGNOSIS — S72001A Fracture of unspecified part of neck of right femur, initial encounter for closed fracture: Secondary | ICD-10-CM | POA: Diagnosis not present

## 2020-09-05 DIAGNOSIS — S72011A Unspecified intracapsular fracture of right femur, initial encounter for closed fracture: Secondary | ICD-10-CM | POA: Diagnosis not present

## 2020-09-05 DIAGNOSIS — Z87891 Personal history of nicotine dependence: Secondary | ICD-10-CM

## 2020-09-05 DIAGNOSIS — G8929 Other chronic pain: Secondary | ICD-10-CM | POA: Diagnosis present

## 2020-09-05 DIAGNOSIS — S72101A Unspecified trochanteric fracture of right femur, initial encounter for closed fracture: Secondary | ICD-10-CM | POA: Diagnosis not present

## 2020-09-05 DIAGNOSIS — I252 Old myocardial infarction: Secondary | ICD-10-CM

## 2020-09-05 DIAGNOSIS — Z85828 Personal history of other malignant neoplasm of skin: Secondary | ICD-10-CM

## 2020-09-05 DIAGNOSIS — I129 Hypertensive chronic kidney disease with stage 1 through stage 4 chronic kidney disease, or unspecified chronic kidney disease: Secondary | ICD-10-CM | POA: Diagnosis not present

## 2020-09-05 DIAGNOSIS — I517 Cardiomegaly: Secondary | ICD-10-CM | POA: Diagnosis not present

## 2020-09-05 DIAGNOSIS — Z9181 History of falling: Secondary | ICD-10-CM | POA: Diagnosis not present

## 2020-09-05 DIAGNOSIS — S72041A Displaced fracture of base of neck of right femur, initial encounter for closed fracture: Secondary | ICD-10-CM | POA: Diagnosis not present

## 2020-09-05 DIAGNOSIS — R262 Difficulty in walking, not elsewhere classified: Secondary | ICD-10-CM | POA: Diagnosis not present

## 2020-09-05 DIAGNOSIS — M80059D Age-related osteoporosis with current pathological fracture, unspecified femur, subsequent encounter for fracture with routine healing: Secondary | ICD-10-CM

## 2020-09-05 DIAGNOSIS — R2681 Unsteadiness on feet: Secondary | ICD-10-CM | POA: Diagnosis not present

## 2020-09-05 DIAGNOSIS — Z823 Family history of stroke: Secondary | ICD-10-CM

## 2020-09-05 DIAGNOSIS — M4807 Spinal stenosis, lumbosacral region: Secondary | ICD-10-CM | POA: Diagnosis present

## 2020-09-05 HISTORY — DX: Fracture of unspecified part of neck of unspecified femur, initial encounter for closed fracture: S72.009A

## 2020-09-05 LAB — CBC
HCT: 42.3 % (ref 36.0–46.0)
Hemoglobin: 13.4 g/dL (ref 12.0–15.0)
MCH: 29.3 pg (ref 26.0–34.0)
MCHC: 31.7 g/dL (ref 30.0–36.0)
MCV: 92.4 fL (ref 80.0–100.0)
Platelets: 210 10*3/uL (ref 150–400)
RBC: 4.58 MIL/uL (ref 3.87–5.11)
RDW: 15.9 % — ABNORMAL HIGH (ref 11.5–15.5)
WBC: 13.2 10*3/uL — ABNORMAL HIGH (ref 4.0–10.5)
nRBC: 0 % (ref 0.0–0.2)

## 2020-09-05 LAB — BASIC METABOLIC PANEL WITH GFR
Anion gap: 8 (ref 5–15)
BUN: 27 mg/dL — ABNORMAL HIGH (ref 8–23)
CO2: 29 mmol/L (ref 22–32)
Calcium: 9.1 mg/dL (ref 8.9–10.3)
Chloride: 101 mmol/L (ref 98–111)
Creatinine, Ser: 1.04 mg/dL — ABNORMAL HIGH (ref 0.44–1.00)
GFR, Estimated: 51 mL/min — ABNORMAL LOW
Glucose, Bld: 198 mg/dL — ABNORMAL HIGH (ref 70–99)
Potassium: 3.6 mmol/L (ref 3.5–5.1)
Sodium: 138 mmol/L (ref 135–145)

## 2020-09-05 LAB — RESP PANEL BY RT-PCR (FLU A&B, COVID) ARPGX2
Influenza A by PCR: NEGATIVE
Influenza B by PCR: NEGATIVE
SARS Coronavirus 2 by RT PCR: NEGATIVE

## 2020-09-05 MED ORDER — FENTANYL CITRATE (PF) 100 MCG/2ML IJ SOLN
25.0000 ug | Freq: Once | INTRAMUSCULAR | Status: AC
  Administered 2020-09-05: 25 ug via INTRAVENOUS
  Filled 2020-09-05: qty 2

## 2020-09-05 MED ORDER — FLUTICASONE PROPIONATE 50 MCG/ACT NA SUSP
1.0000 | Freq: Every day | NASAL | Status: DC | PRN
  Filled 2020-09-05: qty 16

## 2020-09-05 MED ORDER — METOPROLOL TARTRATE 50 MG PO TABS
50.0000 mg | ORAL_TABLET | Freq: Two times a day (BID) | ORAL | Status: DC
  Administered 2020-09-06 – 2020-09-14 (×16): 50 mg via ORAL
  Filled 2020-09-05 (×19): qty 1

## 2020-09-05 MED ORDER — HEPARIN SODIUM (PORCINE) 5000 UNIT/ML IJ SOLN: 5000.0000 [IU] | Freq: Three times a day (TID) | INTRAMUSCULAR | Status: DC

## 2020-09-05 MED ORDER — ACETAMINOPHEN 325 MG PO TABS
650.0000 mg | ORAL_TABLET | Freq: Four times a day (QID) | ORAL | Status: DC
  Administered 2020-09-06 – 2020-09-07 (×7): 650 mg via ORAL
  Filled 2020-09-05 (×7): qty 2

## 2020-09-05 MED ORDER — LORATADINE 10 MG PO TABS: 10.0000 mg | ORAL_TABLET | Freq: Every day | ORAL | Status: DC | PRN

## 2020-09-05 MED ORDER — PRAVASTATIN SODIUM 10 MG PO TABS: 20.0000 mg | ORAL_TABLET | Freq: Every day | ORAL | Status: DC

## 2020-09-05 MED ORDER — HYDROCODONE-ACETAMINOPHEN 5-325 MG PO TABS
1.0000 | ORAL_TABLET | Freq: Four times a day (QID) | ORAL | Status: DC | PRN
  Administered 2020-09-06 – 2020-09-09 (×6): 1 via ORAL
  Filled 2020-09-05 (×6): qty 1

## 2020-09-05 MED ORDER — ALPRAZOLAM 0.5 MG PO TABS: 0.5000 mg | ORAL_TABLET | Freq: Every day | ORAL | Status: DC

## 2020-09-05 MED ORDER — PRAVASTATIN SODIUM 40 MG PO TABS
20.0000 mg | ORAL_TABLET | Freq: Every day | ORAL | Status: DC
  Administered 2020-09-07 – 2020-09-14 (×8): 20 mg via ORAL
  Filled 2020-09-05 (×8): qty 1

## 2020-09-05 MED ORDER — SERTRALINE HCL 50 MG PO TABS
50.0000 mg | ORAL_TABLET | Freq: Every day | ORAL | Status: DC
  Administered 2020-09-06 – 2020-09-14 (×8): 50 mg via ORAL
  Filled 2020-09-05 (×8): qty 1

## 2020-09-05 MED ORDER — POLYETHYLENE GLYCOL 3350 17 G PO PACK: 17.0000 g | PACK | Freq: Every day | ORAL | Status: DC | PRN

## 2020-09-05 MED ORDER — FUROSEMIDE 40 MG PO TABS
40.0000 mg | ORAL_TABLET | Freq: Two times a day (BID) | ORAL | Status: DC
  Administered 2020-09-06 – 2020-09-14 (×16): 40 mg via ORAL
  Filled 2020-09-05 (×16): qty 1

## 2020-09-05 MED ORDER — FENTANYL CITRATE (PF) 100 MCG/2ML IJ SOLN
25.0000 ug | INTRAMUSCULAR | Status: DC | PRN
  Administered 2020-09-06 – 2020-09-07 (×3): 25 ug via INTRAVENOUS
  Filled 2020-09-05 (×4): qty 2

## 2020-09-05 MED ORDER — INSULIN ASPART 100 UNIT/ML IJ SOLN
0.0000 [IU] | Freq: Three times a day (TID) | INTRAMUSCULAR | Status: DC
  Administered 2020-09-06: 3 [IU] via SUBCUTANEOUS
  Administered 2020-09-07 – 2020-09-08 (×3): 1 [IU] via SUBCUTANEOUS
  Administered 2020-09-08: 2 [IU] via SUBCUTANEOUS
  Administered 2020-09-08: 1 [IU] via SUBCUTANEOUS
  Administered 2020-09-09 (×2): 2 [IU] via SUBCUTANEOUS
  Administered 2020-09-09 – 2020-09-10 (×2): 1 [IU] via SUBCUTANEOUS
  Administered 2020-09-10: 3 [IU] via SUBCUTANEOUS
  Administered 2020-09-11 – 2020-09-12 (×4): 1 [IU] via SUBCUTANEOUS
  Administered 2020-09-12: 3 [IU] via SUBCUTANEOUS
  Administered 2020-09-12 – 2020-09-13 (×4): 1 [IU] via SUBCUTANEOUS
  Administered 2020-09-14: 2 [IU] via SUBCUTANEOUS
  Administered 2020-09-14: 1 [IU] via SUBCUTANEOUS

## 2020-09-05 MED ORDER — SENNA 8.6 MG PO TABS
1.0000 | ORAL_TABLET | Freq: Two times a day (BID) | ORAL | Status: DC
  Administered 2020-09-06 – 2020-09-11 (×9): 8.6 mg via ORAL
  Filled 2020-09-05 (×10): qty 1

## 2020-09-05 MED ORDER — DILTIAZEM HCL ER COATED BEADS 120 MG PO CP24
120.0000 mg | ORAL_CAPSULE | Freq: Every day | ORAL | Status: DC
  Administered 2020-09-06 – 2020-09-14 (×9): 120 mg via ORAL
  Filled 2020-09-05 (×9): qty 1

## 2020-09-05 NOTE — H&P (Signed)
[facility] History and Physical Service Pager: [phone]  Patient name: [name] Medical record number: [id number] Date of birth: 1927/09/04 Age: 87 y.o. Gender: female  Primary Care Provider: Ria Bush, MD Consultants: Ortho Code Status: Full Code, confirmed with Granddaughter) Preferred Emergency Contact: [name] (granddaughter) [phone]  Chief Complaint: Hip pain after fall  Assessment and Plan: [name] is a 87 y.o. female presenting with right hip fracture after mechanical fall at home. PMH is significant for paroxsmal afib on eliquis, h/o provoked DVT, T2DM, HLD, HFpEF (EF 50-55%), depression/anxiety, GERD, CAD, CKD III, h/o skin cancer, chronic pain 2/2 spinal stenosis  Right femoral neck fracture 2/2 Mechanical Fall: Acute.  Fell earlier today while using her walker after reaching for something on the counter, no preceding sx or LOC.  XR with displaced right femoral neck fracture with angulation deformity and possibly comminuted.  Right leg externally rotated and shortened on exam without significant swelling or ecchymoses, neurovascularly intact. No anemia. Given low level fall, likely osteoporotic fracture.  Orthopedic surgery plans for ORIF on Monday, 6/6, due to last Eliquis dose this morning (6/4).  -Admit to surg-tele, attending Dr. Ardelia Mems -Orthopedics on board, plans for surgical repair on 6/6 - Pain control: Scheduled tylenol/ice, Norco 5-325 every 6 for moderate, fentanyl 25 mcg every 2 for severe, will titrate as needed - Vitamin D level - PT/OT/transition of care consult, will likely need placement after surgery - Consider bisphosphonate therapy after recovery for osteoporosis - Hold home Eliquis, heparin for DVT prophylaxis while awaiting surgery  Paroxysmal Atrial Fibrillation on chronic anticoagulation: Chronic, stable. Currently in a.fib/flutter but rate controlled with HR 70-80's. Current  medications include: Eliquis 5mg  BID, Metoprolol 50mg  BID, and diltiazem 120 mg daily. - Monitor telemetry - Hold home Eliquis (no recent history of DVT/CVA) for upcoming surgical repair - Continue home metoprolol and diltiazem as above  T2DM: Chronic, stable. Last A1C 7.3 (08/28/20).  CBG 198 on admit.  Home meds: Januvia 50mg  QD and Pioglitazone 15mg  QD. - Hold home meds  - sSSI - CBG with meals/bedtime  HTN: Chronic, stable.  BP 159/70 on admission, likely elevated in the setting of pain. Home meds: Metoprolol 50mg  BID and diltiazem. - Continue home meds  HLD: Chronic, stable.  Home meds: Pravastatin 20mg  QD. - Start pravastatin after surgery  GERD  H/o PUD: Chronic, stable. Asymptomatic at this time.  Has Pepcid and Protonix listed but she is unsure which when she takes or if takes both. - Hold home Pepcid/Protonix, will clarify tomorrow  Depression/GAD: Chronic, stable.  Home meds: Zoloft 50mg  QD and Xanax 0.5mg  PRN   HFpEF  CAD s/p CABG in 1996: Chronic, stable.  Associated with chronic SOB, no current or new SOB/CP.  She follows with Dr. Manley Mason, cardiology [facility], and most recently seen in 06/2020.  Last echo (09/02/19) with EF 50-55%. -Continue home metoprolol - Continue home Lasix 40 mg twice daily - Monitor telemetry/if any symptoms  CKD IIIa: Chronic, stable.  Cr 1.04, at baseline 1.0-1.2. - AM BMP  Chronic Pain 2/2 Spinal Stenosis of Lumbosacral Region: Home meds; Norco 5-325mg : 1/2 tablet BID PRN. - Acute pain regimen as above   FEN/GI: Cardiac diet, n.p.o. at midnight tomorrow night Prophylaxis: Heparin SQ, holding home Eliquis  Disposition: med-surg tele  History of Present Illness:  [name] is a 87 y.o. female presenting with right hip pain after a fall and found to have right hip fracture. Her friend, Stormy Card, is  at bedside and helps provides some history. Patient answers in limited responses due to pain. She has known significant hearing loss, not  currently wearing aids.   She reports she was at her usual state of health and a walking in her house today with her walker. She went to grab something on a counter and then when she brought it back to her walker she felt like maybe her foot got caught on something and then she fell down.  She denies any loss of consciousness and believes that she did not hit her head, fell onto her right side, remembers falling entirely.  Her friend Stormy Card came over after she fell he reports she was actually able to walk about 30 feet with some pain but no weakness.  Later she went to stand up again and felt a "pop "and then was not able to walk due to severe pain.  She denies any preceding chest pain, new shortness of breath (reports chronic SOB that has been unchanged), lightheadedness/dizziness, or localized weakness.  She has known paroxysmal A. fib and history of DVT in 2015 on Eliquis, last taken this morning.  She lives at home with 2 roommates, both of which are elderly.   ED course: On arrival she was hemodynamically stable.  She was given IV fentanyl 100 via EMS and additional 50 mg total while in the ED.  Hip x-ray showing a displaced fracture of the right femoral neck, possibly comminuted, with an angulation deformity.  Possible fracture within the left superior pubic ramus however felt likely more artifact.  Ortho was consulted and planning on ORIF either tomorrow or Monday.  Review Of Systems: Per HPI with the following additions:  Review of Systems  Constitutional: Negative for chills and fever.  HENT: Negative.  Negative for congestion.   Respiratory: Negative for shortness of breath.   Cardiovascular: Negative for chest pain.  Gastrointestinal: Positive for constipation. Negative for abdominal pain, nausea and vomiting.  Genitourinary: Negative.   Musculoskeletal: Positive for back pain.       Right hip pain  Skin: Negative for color change.  Neurological: Negative for dizziness, seizures, syncope,  light-headedness and headaches.  Psychiatric/Behavioral: Negative for behavioral problems and confusion.     Patient Active Problem List   Diagnosis Date Noted  . Hip fracture ([place]) 09/05/2020  . Financial difficulty 02/26/2020  . UTI (urinary tract infection) 10/01/2019  . CKD stage 3 due to type 2 diabetes mellitus ([place]) 09/02/2019  . History of CVA in adulthood 08/23/2019  . Lethargy 07/27/2019  . Polypharmacy 07/27/2019  . Pneumonia 02/15/2019  . E coli bacteremia 02/13/2019  . Dyspnea   . Diverticulitis of duodenum   . Duodenal ulcer perforation ([place]) 02/08/2019  . Chronic leg pain 11/14/2018  . Atherosclerosis of native arteries of the extremities with ulceration ([place]) 08/21/2018  . Chronic constipation 06/18/2018  . Erythema of breast 04/18/2018  . MRSA cellulitis 03/31/2018  . Advanced directives, counseling/discussion 01/29/2018  . Bilateral hearing loss 01/29/2018  . Spinal stenosis of lumbosacral region 01/08/2018  . Systolic murmur 94/49/6759  . De Quervain's tenosynovitis, left 01/09/2017  . Encounter for chronic pain management 02/09/2016  . Paroxysmal atrial fibrillation (HCC)   . Varicose veins of left lower extremity with inflammation, with ulcer of calf limited to breakdown of skin ([place]) 05/22/2015  . Varicose veins of right lower extremity with inflammation 01/23/2015  . Diabetes mellitus type 2 with peripheral artery disease ([place])   . Chronic venous insufficiency   .  Bleeding from varicose veins of lower extremity, left 02/13/2014  . Diabetic polyneuropathy ([place]) 11/19/2013  . Type 2 diabetes mellitus with neurological manifestations, controlled ([place]) 11/19/2013  . Right shoulder pain 08/19/2013  . Cerumen impaction 04/16/2013  . Abdominal pain 05/14/2012  . Peptic ulcer disease   . Unsteady gait 12/13/2011  . Chronic lower back pain 09/14/2011  . Hyperlipidemia associated with type 2 diabetes mellitus ([place]) 09/06/2010  . GAD (generalized anxiety  disorder) 03/15/2010  . Obesity, Class I, BMI 30-34.9 03/15/2010  . Hypertension 03/15/2010  . (HFpEF) heart failure with preserved ejection fraction ([place]) 03/15/2010  . Coronary artery disease     Past Medical History: Past Medical History:  Diagnosis Date  . Actinic keratosis 01/22/2020   right forearm  . Acute deep vein thrombosis (DVT) of distal vein of right lower extremity ([place]) 12/2013   Acute thrombus of R proximal gastrocnemius vein. Symptomatic provoked distal DVT  . Anxiety   . Basal cell carcinoma 10/30/2018   R nasal bridge, treated with Moh's  . Basal cell carcinoma 01/22/2020   left forearm, nodular  . Cellulitis of leg, left 01/16/2014  . CHF (congestive heart failure) ([place])   . Chronic back pain 03/2012   lumbar DDD with herniation - s/p L S1 transforaminal ESI (10/2012 Dr. Sharlet Salina)  . Chronic venous insufficiency   . Coronary artery disease    CABG 1998  . Cystocele   . Depression   . Diabetes mellitus    type 2, followed by endo.  . Diastolic dysfunction   . Diverticulosis   . Hemorrhoids   . History of heart attack   . Hx of echocardiogram    a. Echo 10/13: mild LVH, EF 55-60%, Gr 1 diast dysfn, PASP 32  . Hyperlipidemia   . Hypertension   . Irritable bowel syndrome   . Microcytic anemia   . PAD (peripheral artery disease) ([place]) 2013   ABI: R 0.91, L 0.83  . Postmenopausal osteoporosis 01/2019   T score of -2.6  . PUD (peptic ulcer disease)   . Varicose veins   . Venous ulcer of leg ([place]) 12/12/2013   Required prolonged treatment by wound center  . Viral gastroenteritis due to [place] virus 05/13/2015   hospitalization    Past Surgical History: Past Surgical History:  Procedure Laterality Date  . ABDOMINAL HYSTERECTOMY  1975   TAH,BSO  . ABI  2013   R 0.91, L 0.83  . APPENDECTOMY     per pt report  . CARDIOVASCULAR STRESS TEST  2015   low risk myoview ([place])  . CATARACT SURGERY    . CHOLECYSTECTOMY    . COLONOSCOPY  02/2010    extensive diverticulosis throughout, small internal hemorrhoids, rec rpt 5 yrs (Dr. Allyn Kenner)  . CORONARY ARTERY BYPASS GRAFT  1998  . dexa scan  09/2011   femur -0.8, forearm 0.6 => normal  . ENDOVENOUS ABLATION SAPHENOUS VEIN W/ LASER Left 03/2014   Kellie Simmering  . ESI  2014   L S1 transforaminal ESI x2 ([place]) without improvement  . HAND SURGERY    . lexiscan myoview  03/2011   Small basal inferolateral and anterolateral reversible perfusion defect suggests ischemia.  EF was normal.   old infarct, no new ischemia  . LOWER EXTREMITY ANGIOGRAPHY Right 09/06/2018   Procedure: LOWER EXTREMITY ANGIOGRAPHY;  Surgeon: Algernon Huxley, MD;  Location: [facility];  Service: Cardiovascular;  Laterality: Right;  . toe surgery    . TYMPANOPLASTY  1960s  R side  . Vault prolapse A & P repair  2009   Dr Rhodia [facility]    Social History: Social History   Tobacco Use  . Smoking status: Former Smoker    Quit date: 03/05/1979    Years since quitting: 41.5  . Smokeless tobacco: Never Used  Vaping Use  . Vaping Use: Never used  Substance Use Topics  . Alcohol use: No    Alcohol/week: 0.0 standard drinks  . Drug use: No   Additional social history: No alcohol, tobacco, illicit drugs Please also refer to relevant sections of EMR.  Family History: Family History  Problem Relation Age of Onset  . Hypertension Mother   . Stroke Mother   . Diabetes Father   . Diabetes Sister   . Diabetes Brother   . Heart disease Son   . Colon cancer Neg Hx    Allergies and Medications: Allergies  Allergen Reactions  . Metformin And Related Diarrhea   No current facility-administered medications on file prior to encounter.   Current Outpatient Medications on File Prior to Encounter  Medication Sig Dispense Refill  . ALPRAZolam (XANAX) 1 MG tablet TAKE 1/2 TABLET 4 TIMES A DAY AS NEEDED ANXIETY (Patient taking differently: Take 0.5 mg by mouth at bedtime.) 30 tablet 3  . ELIQUIS 5 MG TABS  tablet TAKE 1 TABLET BY MOUTH TWICE A DAY 60 tablet 5  . fluticasone (FLONASE) 50 MCG/ACT nasal spray Place 1 spray into both nostrils daily as needed for allergies.    . furosemide (LASIX) 40 MG tablet TAKE ONE TABLET BY MOUTH TWICE DAILY 180 tablet 1  . HYDROcodone-acetaminophen (NORCO/VICODIN) 5-325 MG tablet Take 0.5 tablets by mouth 2 (two) times daily as needed for moderate pain or severe pain. (Patient taking differently: Take 0.5 tablets by mouth at bedtime.) 30 tablet 0  . pantoprazole (PROTONIX) 40 MG tablet TAKE 1 TABLET BY MOUTH TWICE A DAY 180 tablet 3  . potassium chloride (MICRO-K) 10 MEQ CR capsule TAKE ONE CAPSULE BY MOUTH ONCE DAILY 90 capsule 1  . sitaGLIPtin (JANUVIA) 100 MG tablet TAKE 1/2 TABLET BY MOUTH EVERY DAY 30 tablet 5  . B Complex-C (B COMPLEX-VITAMIN C PO) Take 1 tablet by mouth daily. +zinc    . diclofenac sodium (VOLTAREN) 1 % GEL Apply 1 application topically 3 (three) times daily. 1 Tube 1  . DILT-XR 120 MG 24 hr capsule TAKE 1 CAPSULE BY MOUTH EVERY DAY 30 capsule 6  . famotidine (PEPCID) 20 MG tablet Take 1 tablet (20 mg total) by mouth daily as needed for heartburn (breakthrough heartburn).    Marland Kitchen glucosamine-chondroitin 500-400 MG tablet Take 1 tablet by mouth daily.    . Lancets (ONETOUCH DELICA PLUS DDUKGU54Y) MISC Use to test blood sugar once daily dx code E11.9 100 each 1  . loperamide (IMODIUM) 2 MG capsule Take 2 mg by mouth 4 (four) times daily as needed for diarrhea or loose stools.    Marland Kitchen loratadine (CLARITIN) 10 MG tablet Take 10 mg by mouth daily as needed for allergies.    . metoprolol tartrate (LOPRESSOR) 50 MG tablet TAKE 1 TABLET BY MOUTH TWICE A DAY 180 tablet 3  . Multiple Vitamins-Minerals (ICAPS) CAPS Take 1 capsule by mouth daily.    . nitroGLYCERIN (NITROSTAT) 0.4 MG SL tablet Place 1 tablet (0.4 mg total) under the tongue every 5 (five) minutes as needed for chest pain. 25 tablet 3  . nystatin ointment (MYCOSTATIN) APPLY TO AFFECTED AREA  TWICE  DAILY 30 g 3  . ONETOUCH VERIO test strip USE TO TEST BLOOD SUGAR ONCE DAILY. DX:E11.9 50 strip 11  . pioglitazone (ACTOS) 15 MG tablet TAKE 1 TABLET BY MOUTH EVERY DAY 30 tablet 6  . pravastatin (PRAVACHOL) 20 MG tablet Take 1 tablet (20 mg total) by mouth daily. 90 tablet 3  . Probiotic Product (ALIGN) 4 MG CAPS Take 1 capsule (4 mg total) by mouth daily.    . sertraline (ZOLOFT) 50 MG tablet Take 1 tablet (50 mg total) by mouth daily. 30 tablet 6  . vitamin E 1000 UNIT capsule Take 1,000 Units by mouth daily.      Objective: BP 133/64   Pulse 85   Temp 98.6 F (37 C) (Oral)   Resp 19   Ht 4\' 8"  (1.422 m)   Wt 62.3 kg   SpO2 96%   BMI 30.79 kg/m  Exam: General: Alert, appears uncomfortable and in pain Eyes: EOMI ENTM: Mucous membranes moist Neck: Supple with full ROM Cardiovascular: Irregularly irregular rate and rhythm Respiratory: No increased work of breathing, able to speak in full sentences without concern.  Clear to auscultation anteriorly/laterally.  On 1-2 L [place] satting 96-98% during evaluation. Gastrointestinal: Soft, nontender, nondistended.  Normoactive bowel sounds. MSK: Right leg is externally rotated and shortened in comparison to the left.  No visualized ecchymoses around right hip, however with some mild soft tissue swelling.  Able to move bilateral feet/toes spontaneously.  Sensation to light touch intact throughout bilateral lower extremity. Extremities: Palpable DP/PT pulses bilaterally.  Prominent varicose/spider veins.  No LE edema. Derm: No ecchymoses, some mild erythema of bilateral lower legs without warmth to touch Neuro: Alert and oriented.  Able to follow normal conversation and complete complex commands.  Smile symmetrical.  EOMI.  Able to move all extremities spontaneously. Psych: Appropriate mood and affect  Labs and Imaging: CBC BMET  Recent Labs  Lab 09/05/20 1950  WBC 13.2*  HGB 13.4  HCT 42.3  PLT 210   Recent Labs  Lab  09/05/20 1950  NA 138  K 3.6  CL 101  CO2 29  BUN 27*  CREATININE 1.04*  GLUCOSE 198*  CALCIUM 9.1     EKG: My own interpretation (not copied from electronic read): A flutter, similar to previous EKGs including mild diffuse ST depression seen in previous EKGs as well.  COVID/Flu negative  DG Pelvis 1-2 Views  Result Date: 09/05/2020 CLINICAL DATA:  Fall, RIGHT hip pain. EXAM: PELVIS - 1-2 VIEW COMPARISON:  None. FINDINGS: Displaced fracture of the RIGHT femoral neck, possibly comminuted, with associated angulation deformity. Questionable fracture within the LEFT superior pubic ramus, but this is more likely artifactual related to patient positioning and overlying bowel loop. Remainder of the osseous structures of the pelvis appear intact and normally aligned. Extensive vascular calcifications. IMPRESSION: 1. Displaced fracture of the RIGHT femoral neck, possibly comminuted, with associated angulation deformity. 2. Questionable fracture within the LEFT superior pubic ramus, but this is more likely artifactual related to patient positioning and overlying bowel loop. Consider CT of the pelvis for further characterization. Electronically Signed   By: Franki Cabot M.D.   On: 09/05/2020 19:35   DG FEMUR, MIN 2 VIEWS RIGHT  Result Date: 09/05/2020 CLINICAL DATA:  Fall, RIGHT hip pain. EXAM: RIGHT FEMUR 2 VIEWS COMPARISON:  None. FINDINGS: Displaced/comminuted fracture of the RIGHT femoral neck, subcapital to intratrochanteric region, with angulation deformity at the fracture site. Femoral head remains grossly well aligned with the acetabulum. Distal  femur appears intact and normally aligned. Extensive vascular calcifications. Vascular stent in place from the level of the upper thigh to the knee. IMPRESSION: Displaced/comminuted fracture of the RIGHT femoral neck, subcapital to intratrochanteric region, with associated angulation deformity. Electronically Signed   By: Franki Cabot M.D.   On: 09/05/2020  19:31   Patriciaann Clan, DO 09/05/2020, 10:12 PM PGY-3, [place] Intern pager: [phone], text pages welcome

## 2020-09-05 NOTE — ED Notes (Signed)
Patient transported to X-ray via Garment/textile technologist

## 2020-09-05 NOTE — ED Notes (Signed)
Report called to Randall Hiss, RN receiving pt.

## 2020-09-05 NOTE — Progress Notes (Signed)
Radiographs reviewed.  Chart reviewed.  Ambulatory 87 yo female fell today.  Has right hip fracture.  Took her Eliquis this morning.  Anticipate surgical fixation on  Sunday or monday.  Admitted to medical service.  Full consult to follow.

## 2020-09-05 NOTE — ED Triage Notes (Signed)
See chief complaint, pt is without pain unless right leg is moved

## 2020-09-05 NOTE — Progress Notes (Signed)
I have spoken to Dr. Marlou Sa about this patient and I will plan to perform surgical repair Monday due to patient being on eliquis.  Azucena Cecil, MD [facility] 10:09 PM

## 2020-09-05 NOTE — ED Provider Notes (Signed)
[facility] EMERGENCY DEPARTMENT Provider Note   CSN: [id number] Arrival date & time: 09/05/20  1823     History Chief Complaint  Patient presents with  . Fall  . Hip Pain    Ground level fall, shortening and outward rotation to right leg, distal PMS intact    [name] is a 87 y.o. female.  The history is provided by the patient and the EMS personnel.  Fall This is a new problem. The current episode started 3 to 5 hours ago. The problem occurs constantly. The problem has not changed since onset.Pertinent negatives include no chest pain, no abdominal pain, no headaches and no shortness of breath. The symptoms are aggravated by twisting, standing and walking. Nothing relieves the symptoms. Treatments tried: EMS gave 100 IV fentanyl with improvement. The treatment provided moderate relief.   On eliquis for DVT  Described as mechanical fall, foot tripped over carpet in her living room. Controlled fall to the ground. Does not think she hit her head. No LOC.     Past Medical History:  Diagnosis Date  . Actinic keratosis 01/22/2020   right forearm  . Acute deep vein thrombosis (DVT) of distal vein of right lower extremity ([place]) 12/2013   Acute thrombus of R proximal gastrocnemius vein. Symptomatic provoked distal DVT  . Anxiety   . Basal cell carcinoma 10/30/2018   R nasal bridge, treated with Moh's  . Basal cell carcinoma 01/22/2020   left forearm, nodular  . Cellulitis of leg, left 01/16/2014  . CHF (congestive heart failure) ([place])   . Chronic back pain 03/2012   lumbar DDD with herniation - s/p L S1 transforaminal ESI (10/2012 Dr. Sharlet Salina)  . Chronic venous insufficiency   . Coronary artery disease    CABG 1998  . Cystocele   . Depression   . Diabetes mellitus    type 2, followed by endo.  . Diastolic dysfunction   . Diverticulosis   . Hemorrhoids   . History of heart attack   . Hx of echocardiogram    a. Echo 10/13: mild LVH, EF 55-60%, Gr 1  diast dysfn, PASP 32  . Hyperlipidemia   . Hypertension   . Irritable bowel syndrome   . Microcytic anemia   . PAD (peripheral artery disease) ([place]) 2013   ABI: R 0.91, L 0.83  . Postmenopausal osteoporosis 01/2019   T score of -2.6  . PUD (peptic ulcer disease)   . Varicose veins   . Venous ulcer of leg ([place]) 12/12/2013   Required prolonged treatment by wound center  . Viral gastroenteritis due to [place] virus 05/13/2015   hospitalization    Patient Active Problem List   Diagnosis Date Noted  . Hip fracture ([place]) 09/05/2020  . Financial difficulty 02/26/2020  . UTI (urinary tract infection) 10/01/2019  . CKD stage 3 due to type 2 diabetes mellitus ([place]) 09/02/2019  . History of CVA in adulthood 08/23/2019  . Lethargy 07/27/2019  . Polypharmacy 07/27/2019  . Pneumonia 02/15/2019  . E coli bacteremia 02/13/2019  . Dyspnea   . Diverticulitis of duodenum   . Duodenal ulcer perforation ([place]) 02/08/2019  . Chronic leg pain 11/14/2018  . Atherosclerosis of native arteries of the extremities with ulceration ([place]) 08/21/2018  . Chronic constipation 06/18/2018  . Erythema of breast 04/18/2018  . MRSA cellulitis 03/31/2018  . Advanced directives, counseling/discussion 01/29/2018  . Bilateral hearing loss 01/29/2018  . Spinal stenosis of lumbosacral region 01/08/2018  . Systolic murmur 12/87/8676  .  De Quervain's tenosynovitis, left 01/09/2017  . Encounter for chronic pain management 02/09/2016  . Paroxysmal atrial fibrillation (HCC)   . Varicose veins of left lower extremity with inflammation, with ulcer of calf limited to breakdown of skin ([place]) 05/22/2015  . Varicose veins of right lower extremity with inflammation 01/23/2015  . Diabetes mellitus type 2 with peripheral artery disease ([place])   . Chronic venous insufficiency   . Bleeding from varicose veins of lower extremity, left 02/13/2014  . Diabetic polyneuropathy ([place]) 11/19/2013  . Type 2 diabetes mellitus with neurological  manifestations, controlled ([place]) 11/19/2013  . Right shoulder pain 08/19/2013  . Cerumen impaction 04/16/2013  . Abdominal pain 05/14/2012  . Peptic ulcer disease   . Unsteady gait 12/13/2011  . Chronic lower back pain 09/14/2011  . Hyperlipidemia associated with type 2 diabetes mellitus ([place]) 09/06/2010  . GAD (generalized anxiety disorder) 03/15/2010  . Obesity, Class I, BMI 30-34.9 03/15/2010  . Hypertension 03/15/2010  . (HFpEF) heart failure with preserved ejection fraction ([place]) 03/15/2010  . Coronary artery disease     Past Surgical History:  Procedure Laterality Date  . ABDOMINAL HYSTERECTOMY  1975   TAH,BSO  . ABI  2013   R 0.91, L 0.83  . APPENDECTOMY     per pt report  . CARDIOVASCULAR STRESS TEST  2015   low risk myoview (Nahser)  . CATARACT SURGERY    . CHOLECYSTECTOMY    . COLONOSCOPY  02/2010   extensive diverticulosis throughout, small internal hemorrhoids, rec rpt 5 yrs (Dr. Allyn Kenner)  . CORONARY ARTERY BYPASS GRAFT  1998  . dexa scan  09/2011   femur -0.8, forearm 0.6 => normal  . ENDOVENOUS ABLATION SAPHENOUS VEIN W/ LASER Left 03/2014   Kellie Simmering  . ESI  2014   L S1 transforaminal ESI x2 (Chasnis) without improvement  . HAND SURGERY    . lexiscan myoview  03/2011   Small basal inferolateral and anterolateral reversible perfusion defect suggests ischemia.  EF was normal.   old infarct, no new ischemia  . LOWER EXTREMITY ANGIOGRAPHY Right 09/06/2018   Procedure: LOWER EXTREMITY ANGIOGRAPHY;  Surgeon: Algernon Huxley, MD;  Location: [facility];  Service: Cardiovascular;  Laterality: Right;  . toe surgery    . TYMPANOPLASTY  1960s   R side  . Vault prolapse A & P repair  2009   [facility]     OB History    Gravida  4   Para  4   Term  2   Preterm      AB      Living  1     SAB      IAB      Ectopic      Multiple      Live Births              Family History  Problem Relation Age of Onset  . Hypertension  Mother   . Stroke Mother   . Diabetes Father   . Diabetes Sister   . Diabetes Brother   . Heart disease Son   . Colon cancer Neg Hx     Social History   Tobacco Use  . Smoking status: Former Smoker    Quit date: 03/05/1979    Years since quitting: 41.5  . Smokeless tobacco: Never Used  Vaping Use  . Vaping Use: Never used  Substance Use Topics  . Alcohol use: No    Alcohol/week: 0.0 standard drinks  .  Drug use: No    Home Medications Prior to Admission medications   Medication Sig Start Date End Date Taking? Authorizing Provider  ALPRAZolam (XANAX) 1 MG tablet TAKE 1/2 TABLET 4 TIMES A DAY AS NEEDED ANXIETY Patient taking differently: Take 0.5 mg by mouth as needed for anxiety. 08/21/20  Yes Ria Bush, MD  B Complex-C (B COMPLEX-VITAMIN C PO) Take 1 tablet by mouth daily. +zinc   Yes [provider]  diclofenac sodium (VOLTAREN) 1 % GEL Apply 1 application topically 3 (three) times daily. 01/09/17  Yes Ria Bush, MD  DILT-XR 120 MG 24 hr capsule TAKE 1 CAPSULE BY MOUTH EVERY DAY Patient taking differently: Take 120 mg by mouth daily. 04/09/20  Yes Ria Bush, MD  ELIQUIS 5 MG TABS tablet TAKE 1 TABLET BY MOUTH TWICE A DAY Patient taking differently: Take 5 mg by mouth 2 (two) times daily. 04/09/20  Yes Nahser, Wonda Cheng, MD  famotidine (PEPCID) 20 MG tablet Take 1 tablet (20 mg total) by mouth daily as needed for heartburn (breakthrough heartburn). 07/30/19  Yes Ria Bush, MD  fluticasone [facility]) 50 MCG/ACT nasal spray Place 1 spray into both nostrils daily as needed for allergies. 09/23/19  Yes [provider]  furosemide (LASIX) 40 MG tablet TAKE ONE TABLET BY MOUTH TWICE DAILY Patient taking differently: Take 40 mg by mouth 2 (two) times daily. 08/21/20  Yes Ria Bush, MD  glucosamine-chondroitin 500-400 MG tablet Take 1 tablet by mouth daily.   Yes [provider]  HYDROcodone-acetaminophen (NORCO/VICODIN) 5-325 MG  tablet Take 0.5 tablets by mouth 2 (two) times daily as needed for moderate pain or severe pain. Patient taking differently: Take 0.5 tablets by mouth at bedtime. 08/28/20  Yes Ria Bush, MD  Lancets (ONETOUCH DELICA PLUS [id number]) MISC Use to test blood sugar once daily dx code E11.9 Patient taking differently: 1 each by Other route See admin instructions. Use to test blood sugar once daily dx code E11.9 04/08/20  Yes Elayne Snare, MD  loperamide (IMODIUM) 2 MG capsule Take 2 mg by mouth 4 (four) times daily as needed for diarrhea or loose stools.   Yes [provider]  loratadine (CLARITIN) 10 MG tablet Take 10 mg by mouth daily as needed for allergies.   Yes [provider]  metoprolol tartrate (LOPRESSOR) 50 MG tablet TAKE 1 TABLET BY MOUTH TWICE A DAY Patient taking differently: Take 50 mg by mouth 2 (two) times daily. 04/09/20  Yes Ria Bush, MD  Multiple Vitamins-Minerals (ICAPS) CAPS Take 1 capsule by mouth daily.   Yes [provider]  nitroGLYCERIN (NITROSTAT) 0.4 MG SL tablet Place 1 tablet (0.4 mg total) under the tongue every 5 (five) minutes as needed for chest pain. 02/02/17  Yes Nahser, Wonda Cheng, MD  nystatin ointment (MYCOSTATIN) APPLY TO AFFECTED AREA TWICE DAILY Patient taking differently: Apply 1 application topically 2 (two) times daily. 07/03/20  Yes Ria Bush, MD  [facility] VERIO test strip USE TO TEST BLOOD SUGAR ONCE DAILY. DX:E11.9 Patient taking differently: 1 each by Other route See admin instructions. USE TO TEST BLOOD SUGAR ONCE DAILY. DX:E11.9 04/09/20  Yes Elayne Snare, MD  pantoprazole (PROTONIX) 40 MG tablet TAKE 1 TABLET BY MOUTH TWICE A DAY Patient taking differently: Take 40 mg by mouth 2 (two) times daily. 04/09/20  Yes Ria Bush, MD  pioglitazone (ACTOS) 15 MG tablet TAKE 1 TABLET BY MOUTH EVERY DAY Patient taking differently: Take 15 mg by mouth daily. 04/09/20  Yes Ria Bush, MD  potassium chloride (MICRO-K)  10 MEQ CR capsule TAKE ONE CAPSULE BY MOUTH ONCE DAILY Patient taking differently: Take 10 mEq by mouth daily. 08/21/20  Yes Ria Bush, MD  pravastatin (PRAVACHOL) 20 MG tablet Take 1 tablet (20 mg total) by mouth daily. 05/27/20  Yes Ria Bush, MD  Probiotic Product (ALIGN) 4 MG CAPS Take 1 capsule (4 mg total) by mouth daily. 08/15/17  Yes Ria Bush, MD  sertraline (ZOLOFT) 50 MG tablet Take 1 tablet (50 mg total) by mouth daily. 08/28/20  Yes Ria Bush, MD  sitaGLIPtin (JANUVIA) 100 MG tablet TAKE 1/2 TABLET BY MOUTH EVERY DAY Patient taking differently: Take 50 mg by mouth daily. 04/16/20  Yes Elayne Snare, MD  vitamin E 1000 UNIT capsule Take 1,000 Units by mouth daily.   Yes [provider]    Allergies    Metformin and related  Review of Systems   Review of Systems  Constitutional: Negative for chills and fever.  HENT: Negative for ear pain and sore throat.   Eyes: Negative for pain and visual disturbance.  Respiratory: Negative for cough and shortness of breath.   Cardiovascular: Negative for chest pain and palpitations.  Gastrointestinal: Negative for abdominal pain and vomiting.  Genitourinary: Negative for dysuria and hematuria.  Musculoskeletal: Positive for arthralgias (R hip). Negative for back pain.  Skin: Negative for color change and rash.  Neurological: Negative for seizures, syncope and headaches.  All other systems reviewed and are negative.   Physical Exam Updated Vital Signs BP (!) 110/93   Pulse 88   Temp 98.2 F (36.8 C) (Oral)   Resp 16   Ht 4\' 8"  (1.422 m)   Wt 62.3 kg   SpO2 94%   BMI 30.79 kg/m   Physical Exam Vitals and nursing note reviewed.  Constitutional:      General: She is not in acute distress.    Appearance: She is well-developed. She is not ill-appearing.     Comments:  Elderly appearing  HENT:     Head: Normocephalic and atraumatic.  Eyes:     Conjunctiva/sclera: Conjunctivae normal.   Cardiovascular:     Rate and Rhythm: Normal rate and regular rhythm.     Heart sounds: No murmur heard.   Pulmonary:     Effort: Pulmonary effort is normal. No respiratory distress.     Breath sounds: Normal breath sounds.  Abdominal:     Palpations: Abdomen is soft.     Tenderness: There is no abdominal tenderness.  Musculoskeletal:     Cervical back: Normal and neck supple. No tenderness or bony tenderness.     Thoracic back: Normal. No tenderness or bony tenderness.     Lumbar back: Normal. No tenderness or bony tenderness.     Right hip: Deformity, tenderness and bony tenderness present. Decreased strength.     Right upper leg: No tenderness or bony tenderness.     Right knee: No swelling, deformity or bony tenderness. Normal range of motion. No tenderness.     Comments:  RLE: Strong distal pulses. Warm, well-perfused. Sensation and motor function grossly intact.  Skin:    General: Skin is warm and dry.  Neurological:     General: No focal deficit present.     Mental Status: She is alert and oriented to person, place, and time.     GCS: GCS eye subscore is 4. GCS verbal subscore is 5. GCS motor subscore is 6.     Cranial Nerves: Cranial nerves are intact.  Sensory: Sensation is intact.     Motor: Motor function is intact.     ED Results / Procedures / Treatments   Labs (all labs ordered are listed, but only abnormal results are displayed) Labs Reviewed  BASIC METABOLIC PANEL - Abnormal; Notable for the following components:      Result Value   Glucose, Bld 198 (*)    BUN 27 (*)    Creatinine, Ser 1.04 (*)    GFR, Estimated 51 (*)    All other components within normal limits  CBC - Abnormal; Notable for the following components:   WBC 13.2 (*)    RDW 15.9 (*)    All other components within normal limits  CBC - Abnormal; Notable for the following components:   WBC 14.0 (*)    RDW 15.9 (*)    All other components within normal limits  BASIC METABOLIC PANEL -  Abnormal; Notable for the following components:   Potassium 3.2 (*)    Glucose, Bld 430 (*)    BUN 25 (*)    Creatinine, Ser 1.07 (*)    Calcium 8.5 (*)    GFR, Estimated 49 (*)    All other components within normal limits  GLUCOSE, CAPILLARY - Abnormal; Notable for the following components:   Glucose-Capillary 290 (*)    All other components within normal limits  GLUCOSE, CAPILLARY - Abnormal; Notable for the following components:   Glucose-Capillary 149 (*)    All other components within normal limits  RESP PANEL BY RT-PCR (FLU A&B, COVID) ARPGX2  SURGICAL PCR SCREEN  VITAMIN D 25 HYDROXY (VIT D DEFICIENCY, FRACTURES)  TROPONIN I (HIGH SENSITIVITY)  TROPONIN I (HIGH SENSITIVITY)    EKG EKG Interpretation  Date/Time:  Saturday September 05 2020 18:53:08 EDT Ventricular Rate:  87 PR Interval:    QRS Duration: 98 QT Interval:  401 QTC Calculation: 483 R Axis:   31 Text Interpretation: Atrial flutter Minimal ST depression, diffuse leads Confirmed by Sherwood Gambler [phone]) on 09/05/2020 7:37:53 PM Also confirmed by Sherwood Gambler [phone]), editor [place], [place] [phone])  on 09/06/2020 9:06:23 AM   Radiology DG Pelvis 1-2 Views  Result Date: 09/05/2020 CLINICAL DATA:  Fall, RIGHT hip pain. EXAM: PELVIS - 1-2 VIEW COMPARISON:  None. FINDINGS: Displaced fracture of the RIGHT femoral neck, possibly comminuted, with associated angulation deformity. Questionable fracture within the LEFT superior pubic ramus, but this is more likely artifactual related to patient positioning and overlying bowel loop. Remainder of the osseous structures of the pelvis appear intact and normally aligned. Extensive vascular calcifications. IMPRESSION: 1. Displaced fracture of the RIGHT femoral neck, possibly comminuted, with associated angulation deformity. 2. Questionable fracture within the LEFT superior pubic ramus, but this is more likely artifactual related to patient positioning and overlying bowel loop.  Consider CT of the pelvis for further characterization. Electronically Signed   By: Franki Cabot M.D.   On: 09/05/2020 19:35   DG CHEST PORT 1 VIEW  Result Date: 09/06/2020 CLINICAL DATA:  Cardiac palpitations and chest pain EXAM: PORTABLE CHEST 1 VIEW COMPARISON:  Sep 02, 2019 FINDINGS: No edema or airspace opacity. Heart is enlarged with pulmonary vascularity within normal limits. No adenopathy. Patient is status post coronary artery bypass grafting. There is aortic atherosclerosis. There is degenerative change in the right shoulder with superior migration of the right humeral head. Calcification noted in right carotid artery. IMPRESSION: Stable cardiac prominence with postoperative change. No edema or airspace opacity. Aortic Atherosclerosis (ICD10-I70.0). Calcification also noted in right  carotid artery. Apparent chronic rotator cuff tear on the right. Electronically Signed   By: Lowella Grip III M.D.   On: 09/06/2020 08:22   DG FEMUR, MIN 2 VIEWS RIGHT  Result Date: 09/05/2020 CLINICAL DATA:  Fall, RIGHT hip pain. EXAM: RIGHT FEMUR 2 VIEWS COMPARISON:  None. FINDINGS: Displaced/comminuted fracture of the RIGHT femoral neck, subcapital to intratrochanteric region, with angulation deformity at the fracture site. Femoral head remains grossly well aligned with the acetabulum. Distal femur appears intact and normally aligned. Extensive vascular calcifications. Vascular stent in place from the level of the upper thigh to the knee. IMPRESSION: Displaced/comminuted fracture of the RIGHT femoral neck, subcapital to intratrochanteric region, with associated angulation deformity. Electronically Signed   By: Franki Cabot M.D.   On: 09/05/2020 19:31    Procedures Procedures   Medications Ordered in ED Medications  diltiazem (CARDIZEM CD) 24 hr capsule 120 mg (120 mg Oral Given 09/06/20 0852)  furosemide (LASIX) tablet 40 mg (40 mg Oral Given 09/06/20 0852)  metoprolol tartrate (LOPRESSOR) tablet 50 mg (50  mg Oral Not Given 09/06/20 0852)  sertraline (ZOLOFT) tablet 50 mg (50 mg Oral Given 09/06/20 0853)  loratadine (CLARITIN) tablet 10 mg (has no administration in time range)  fluticasone (FLONASE) 50 MCG/ACT nasal spray 1 spray (has no administration in time range)  HYDROcodone-acetaminophen (NORCO/VICODIN) 5-325 MG per tablet 1 tablet (1 tablet Oral Given 09/06/20 1152)  fentaNYL (SUBLIMAZE) injection 25 mcg (25 mcg Intravenous Given 09/06/20 0201)  senna (SENOKOT) tablet 8.6 mg (8.6 mg Oral Given 09/06/20 0853)  polyethylene glycol (MIRALAX / GLYCOLAX) packet 17 g (has no administration in time range)  pravastatin (PRAVACHOL) tablet 20 mg (has no administration in time range)  acetaminophen (TYLENOL) tablet 650 mg (650 mg Oral Given 09/06/20 1200)  insulin aspart (novoLOG) injection 0-6 Units ( Subcutaneous Not Given 09/06/20 1154)  ALPRAZolam (XANAX) tablet 0.5 mg (0.5 mg Oral Given 09/06/20 0146)  alum & mag hydroxide-simeth (MAALOX/MYLANTA) 200-200-20 MG/5ML suspension 15 mL (15 mLs Oral Given 09/06/20 0146)  pantoprazole (PROTONIX) EC tablet 40 mg (40 mg Oral Given 09/06/20 0853)  fentaNYL (SUBLIMAZE) injection 25 mcg (25 mcg Intravenous Given 09/05/20 1950)  fentaNYL (SUBLIMAZE) injection 25 mcg (25 mcg Intravenous Given 09/05/20 2156)  potassium chloride SA (KLOR-CON) CR tablet 40 mEq (40 mEq Oral Given 09/06/20 4008)    ED Course  I have reviewed the triage vital signs and the nursing notes.  Pertinent labs & imaging results that were available during my care of the patient were reviewed by me and considered in my medical decision making (see chart for details).  Clinical Course as of 09/06/20 1545  Sat Sep 05, 2020  1934 Looks like a R femoral neck fx on the right on my assessment. Ordered for additional views of that hip to better specify [ZB]  1947 Family updated on results. Ortho paged. [ZB]  2005 Spoke with ortho. No surgery today since she last took her DOAC this morning. May be able to operate  tomorrow. More likely on Monday. Recommend admission to medicine, hold eliquis. [ZB]    Clinical Course User Index [ZB] Pearson Grippe, DO   MDM Rules/Calculators/A&P                          This is a 87 year old female with history as above including anticoagulation on Eliquis for previous DVT who presented to the emergency department via EMS after a described mechanical ground-level fall from standing position  causing injury to her right hip. Did not hit her head.  No LOC. The emergency department she was hemodynamically stable with a nonfocal neurologic exam.  Right lower extremity neurovascular intact.  No evidence of open wounds. Though considered, I have low index of suspicion for traumatic intracranial, spinal, intrathoracic or intra-abdominal trauma that would necessitate CT imaging at this time. Order for plain films, basic blood work.  See clinical course above for further medical decision-making.  Final Clinical Impression(s) / ED Diagnoses Final diagnoses:  Right hip pain  SOB (shortness of breath)    Rx / DC Orders ED Discharge Orders    None       Pearson Grippe, DO 09/06/20 [place], MD 09/08/20 [phone]

## 2020-09-06 ENCOUNTER — Inpatient Hospital Stay (HOSPITAL_COMMUNITY): Payer: Medicare Other

## 2020-09-06 DIAGNOSIS — M25551 Pain in right hip: Secondary | ICD-10-CM | POA: Diagnosis not present

## 2020-09-06 LAB — CBC
HCT: 41.5 % (ref 36.0–46.0)
Hemoglobin: 12.8 g/dL (ref 12.0–15.0)
MCH: 28.4 pg (ref 26.0–34.0)
MCHC: 30.8 g/dL (ref 30.0–36.0)
MCV: 92.2 fL (ref 80.0–100.0)
Platelets: 203 10*3/uL (ref 150–400)
RBC: 4.5 MIL/uL (ref 3.87–5.11)
RDW: 15.9 % — ABNORMAL HIGH (ref 11.5–15.5)
WBC: 14 10*3/uL — ABNORMAL HIGH (ref 4.0–10.5)
nRBC: 0 % (ref 0.0–0.2)

## 2020-09-06 LAB — BASIC METABOLIC PANEL WITH GFR
Anion gap: 13 (ref 5–15)
BUN: 25 mg/dL — ABNORMAL HIGH (ref 8–23)
CO2: 25 mmol/L (ref 22–32)
Calcium: 8.5 mg/dL — ABNORMAL LOW (ref 8.9–10.3)
Chloride: 98 mmol/L (ref 98–111)
Creatinine, Ser: 1.07 mg/dL — ABNORMAL HIGH (ref 0.44–1.00)
GFR, Estimated: 49 mL/min — ABNORMAL LOW
Glucose, Bld: 430 mg/dL — ABNORMAL HIGH (ref 70–99)
Potassium: 3.2 mmol/L — ABNORMAL LOW (ref 3.5–5.1)
Sodium: 136 mmol/L (ref 135–145)

## 2020-09-06 LAB — GLUCOSE, CAPILLARY
Glucose-Capillary: 290 mg/dL — ABNORMAL HIGH (ref 70–99)
Glucose-Capillary: 149 mg/dL — ABNORMAL HIGH (ref 70–99)
Glucose-Capillary: 150 mg/dL — ABNORMAL HIGH (ref 70–99)
Glucose-Capillary: 200 mg/dL — ABNORMAL HIGH (ref 70–99)

## 2020-09-06 LAB — URINALYSIS, COMPLETE (UACMP) WITH MICROSCOPIC
Bilirubin Urine: NEGATIVE
Glucose, UA: NEGATIVE mg/dL
Hgb urine dipstick: NEGATIVE
Ketones, ur: NEGATIVE mg/dL
Nitrite: NEGATIVE
Protein, ur: NEGATIVE mg/dL
Specific Gravity, Urine: 1.013 (ref 1.005–1.030)
pH: 5 (ref 5.0–8.0)

## 2020-09-06 LAB — TROPONIN I (HIGH SENSITIVITY)
Troponin I (High Sensitivity): 15 ng/L (ref <18)
Troponin I (High Sensitivity): 17 ng/L (ref <18)

## 2020-09-06 LAB — SURGICAL PCR SCREEN
MRSA, PCR: NEGATIVE
Staphylococcus aureus: NEGATIVE

## 2020-09-06 LAB — VITAMIN D 25 HYDROXY (VIT D DEFICIENCY, FRACTURES): Vit D, 25-Hydroxy: 43.11 ng/mL (ref 30–100)

## 2020-09-06 MED ORDER — TRANEXAMIC ACID 1000 MG/10ML IV SOLN
2000.0000 mg | INTRAVENOUS | Status: DC
  Filled 2020-09-06 (×2): qty 20

## 2020-09-06 MED ORDER — PANTOPRAZOLE SODIUM 40 MG PO TBEC
40.0000 mg | DELAYED_RELEASE_TABLET | Freq: Two times a day (BID) | ORAL | Status: DC
  Administered 2020-09-06 – 2020-09-14 (×17): 40 mg via ORAL
  Filled 2020-09-06 (×18): qty 1

## 2020-09-06 MED ORDER — ALPRAZOLAM 0.5 MG PO TABS
0.5000 mg | ORAL_TABLET | Freq: Every day | ORAL | Status: DC
  Administered 2020-09-06 – 2020-09-10 (×6): 0.5 mg via ORAL
  Filled 2020-09-06 (×6): qty 1

## 2020-09-06 MED ORDER — ALUM & MAG HYDROXIDE-SIMETH 200-200-20 MG/5ML PO SUSP
15.0000 mL | Freq: Four times a day (QID) | ORAL | Status: DC | PRN
  Administered 2020-09-06: 15 mL via ORAL
  Filled 2020-09-06: qty 30

## 2020-09-06 MED ORDER — TRANEXAMIC ACID-NACL 1000-0.7 MG/100ML-% IV SOLN
1000.0000 mg | INTRAVENOUS | Status: AC
  Administered 2020-09-07: 1000 mg via INTRAVENOUS
  Filled 2020-09-06: qty 100

## 2020-09-06 MED ORDER — CEFAZOLIN SODIUM-DEXTROSE 2-4 GM/100ML-% IV SOLN
2.0000 g | INTRAVENOUS | Status: AC
Start: 2020-09-07 — End: 2020-09-07
  Administered 2020-09-07: 2 g via INTRAVENOUS
  Filled 2020-09-06: qty 100

## 2020-09-06 MED ORDER — POVIDONE-IODINE 10 % EX SWAB
2.0000 "application " | Freq: Once | CUTANEOUS | Status: AC
  Administered 2020-09-07: 2 via TOPICAL

## 2020-09-06 MED ORDER — POTASSIUM CHLORIDE CRYS ER 20 MEQ PO TBCR
40.0000 meq | EXTENDED_RELEASE_TABLET | Freq: Once | ORAL | Status: AC
  Administered 2020-09-06: 40 meq via ORAL
  Filled 2020-09-06: qty 2

## 2020-09-06 NOTE — Progress Notes (Signed)
Interim progress note:  Received page from RN for request of Maalox.  Called RN to discuss, states patient reported heartburn.  Went to evaluate patient at bedside given age and known history of CAD/A. Fib.  She reports she is experiencing 2 different sensations currently.  She is having a burning sensation that is consistent with her normal heartburn, takes Protonix at home and did not have her normal nighttime dose yet.  She also reports that she feels her heart beating faster than usual and will have [place] episodes where she feels more short of breath because of it.  States she will often feel when she is in A. fib.  She denies any chest pain or pressure, however states this feels a [place] bit different than when she usually feels herself in an irregular rhythm.  Placed order for Maalox/Mylanta and Protonix prior to evaluating patient, has not received yet.  Reports continued pain in her leg.  Blood pressure 131/76, pulse 92, temperature 97.9 F (36.6 C), temperature source Oral, resp. rate 19, height 4\' 8"  (1.422 m), weight 62.3 kg, SpO2 95 %. General: Alert, appears uncomfortable with pain Cardiac: Irregular irregular rhythm Pulmonary: Normal work of breathing, however is taking more shallow breaths.  Currently on 1.5L [place]. Psych: Anxious mood and affect  EKG being completed during evaluation.  Having difficulty with several of the leads with artifact.  Showing A. fib with rate around 103-108 bpm.  Significant artifact in several leads.  However in comparison to earlier EKG last night and previous, still has some nonspecific ST changes (some ST depression <36mm in V4-6 that have been present before) predominantly in the lateral and inferior leads that appear overall similar to previous.  A/p:   GERD: Overall burning sensation consistent with her known history of reflux.  Will give Maalox/Mylanta in addition to her nighttime home Protonix.  Although realize the Protonix can contribute to  osteoporosis and here currently for pathologic fracture, however given her age and request for symptomatic relief, will keep this medication on.  SOB/Tachycardia, in setting of A.fib: Feeling short of breath specifically with feeling her heart racing, slightly different to previous times that she has felt her heart in an irregular rhythm.  She is tachycardic around 103-108, otherwise hemodynamically stable.  Already received home diltiazem and metoprolol as prescribed.  She is on 1.5L, does not wear oxygen at home, attributed this to shallow breathing with leg pain and current opioid use.  However, will obtain CXR, reassured that lungs otherwise sound clear on admission.  Additionally will give home nighttime 0.5 mg Xanax and as needed pain medication.  If HR still elevated after better anxiety/pain control, will consider IV metoprolol.  Given her age and known CAD with different sensation than usual and some nonspecific ST changes, will obtain troponin x2.  Hopeful that her concurrent hip fracture does not complicate troponin value.  - Check troponin - CXR - Maalox/Mylanta and Protonix - Give home Xanax and as needed pain medication  Remainder of plan as is in H&P.  [name] Clan, DO

## 2020-09-06 NOTE — Progress Notes (Signed)
Family reports patient saw TV on floor and hallucinated prior to fall. Stated usually cause of her UTI. Notified MD on call and new order noted. Await on pt to void for clean catch.

## 2020-09-06 NOTE — Progress Notes (Signed)
PT Cancellation Note  Patient Details Name: [name] MRN: [id number] DOB: Feb 22, 1928   Cancelled Treatment:    Reason Eval/Treat Not Completed: Active bedrest order. PT will continue to follow-up with pt and await updated activity order prior to initiating evaluation.    Clearnce Sorrel Shoshannah Faubert 09/06/2020, 8:10 AM

## 2020-09-06 NOTE — Progress Notes (Signed)
Spoke with Dr. Domenic Polite, [facility] cardiology, in reference to her operative risk.  Given the nature and necessity of her orthopedic surgery, he reports no further evaluation is needed at this time given that she has been stable on her home medications.  Encouraged continued home medications to maintain rate control.  She has had no angina in the outpatient setting prior to this fall.  RCRI calculator placing her into moderate risk for this surgery.  No cardiology evaluation/consult needed.    Patriciaann Clan, DO

## 2020-09-06 NOTE — Progress Notes (Signed)
OT Cancellation Note  Patient Details Name: [name] MRN: [id number] DOB: 09/27/1927   Cancelled Treatment:    Reason Eval/Treat Not Completed: Active bedrest order (Plan for hip fracture fixation with Dr. Erlinda Hong tomorrow, 09/07/20. OT to follow for OT eval s/p sx.)   Jefferey Pica, OTR/L Acute Rehabilitation Services Pager: [phone] Office: [phone]   Copper Basnett C 09/06/2020, 12:06 PM

## 2020-09-06 NOTE — Progress Notes (Signed)
Urine collected and sent to Lab.

## 2020-09-06 NOTE — Progress Notes (Addendum)
Family Medicine Teaching Service Daily Progress Note Intern Pager: [phone]  Patient name: [name] Medical record number: [id number] Date of birth: [birth year] Age: 87 y.o. Gender: female  Primary Care Provider: Ria Bush, MD Consultants: Ortho Code Status: Full  Pt Overview and Major Events to Date:  6/4: Admitted.   Assessment and Plan: [name] is a 87 y.o. female presenting with right hip fracture after mechanical fall at home. PMH is significant for paroxsmal afib on eliquis, h/o provoked DVT, T2DM, HLD, HFpEF (EF 50-55%), depression/anxiety, GERD, CAD, CKD III, h/o skin cancer, chronic pain 2/2 spinal stenosis  Right femoral neck fracture 2/2 Mechanical Fall (likely osteoporotic fracture): Acute.  Associated angulation deformity and possibly comminuted. Orthopedic surgery plans for ORIF on Monday, 6/6. Last Eliquis dose on 6/4 am. Pain moderately controlled.  -Orthopedics on board, appreciate recommendations as above  - Cardiovascularly high risk, will discuss with cardiology for possible preoperative evaluation  - Pain control: Scheduled tylenol/ice, Norco 5-325 every 6 for moderate, fentanyl 25 mcg every 2 for severe, will titrate as needed - F/u Vitamin D level - PT/OT/TOC consult, will likely need placement after surgery - Consider bisphosphonate therapy after recovery for osteoporosis - Hold home Eliquis, heparin SQ for DVT prophylaxis while awaiting surgery (this was cleared with ortho team (spoke with [facility]) to do so, will d/c ~8 hours prior to scheduled surgery)  Paroxysmal Atrial Fibrillation on chronic anticoagulation: Improved.  Elevated HR overnight w/ palpitations/SOB, likely d/t pain and anxiety--resolved with treatment of both. Trop flat 15>17. HR currently in 70's.  - Monitor telemetry - Hold home Eliquis BID (no recent history of DVT/CVA) for upcoming surgical repair - Continue home metoprolol 50 BID and diltiazem 120mg  daily    T2DM: Chronic, well controlled (A1c 7.3). Glucose 430 on am BMP, without AG or acidosis, likely elevated in setting of current physical stress.  - Check CBG again this am and treat as appropriate with SSI  - Hold home meds (Januvia 50mg  qd and Pioglitazone 15mg  QD) - sSSI with meals  - CBG with meals/bedtime -- Consider adding lantus if CBG consistently elevated   HTN: Chronic, stable.  SBP normotensive after admit.  - Continue home metoprolol and diltiazem   HLD: Chronic, stable.  Home meds: Pravastatin 20mg  QD. - Start pravastatin after surgery  GERD  H/o PUD: Chronic, stable. Heartburn ON that resolved with protonix/maalox and mylanta.  --Restart home protonix BID (Will cont for symptomatic relief for now, unlikely to significant worsen already present osteoporosis at this time)  --Mylanta/maalox PRN   Depression/GAD: Chronic, stable.  Home meds: Zoloft 50mg  QD and Xanax 0.5mg  PRN   HFpEF  CAD s/p CABG in 1996: Chronic, stable.  Associated with chronic SOB. She follows with Dr. Manley Mason, cardiology [facility], and most recently seen in 06/2020.  Last echo (09/02/19) with EF 50-55%. -Continue home metoprolol - Continue home Lasix 40 mg twice daily - Monitor telemetry/if any symptoms  CKD IIIa: Chronic, stable.  Currently at baseline 1.0-1.2. - AM BMP  Chronic Pain 2/2 Spinal Stenosis of Lumbosacral Region: Home meds; Norco 5-325mg : 1/2 tablet BID PRN. - Acute pain regimen as above   FEN/GI: Cardiac diet, n.p.o. at midnight tomorrow night Prophylaxis: Heparin SQ, holding home Eliquis  Status is: Inpatient  Remains inpatient appropriate because:Ongoing active pain requiring inpatient pain management and Inpatient level of care appropriate due to severity of illness   Dispo: The patient is from: Home  Anticipated d/c is to: SNF              Patient currently is not medically stable to d/c.   Difficult to place patient No  Subjective:  See overnight  interim progress notes for events, experience heartburn and palpitations in the setting of known heartburn/A. fib.  Troponin 15> 17 at that time with EKG overall similar to previous.  Chest x-ray ordered but has not been obtained.  On evaluation this morning, she is resting comfortably.  She states that her heartburn and palpitations/SOB episodes have resolved.  Heart rate is now back into the 70s.  Still having pain in her leg.  Objective: Temp:  [97.9 F (36.6 C)-98.6 F (37 C)] 97.9 F (36.6 C) (06/05 0010) Pulse Rate:  [84-92] 92 (06/05 0010) Resp:  [19-20] 19 (06/04 2115) BP: (131-159)/(64-76) 131/76 (06/05 0010) SpO2:  [95 %-100 %] 95 % (06/05 0010) Weight:  [62.3 kg] 62.3 kg (06/04 1852) Physical Exam: General: Alert, NAD, resting comfortably HEENT: NCAT, MMM Cardiac: Irregular rate and rhythm Lungs: Clear bilaterally, no increased WOB on 1.5L Abdomen: soft, non-tender Msk: Right leg still externally rotated and shortened compared to left.  Moves all extremities spontaneously, with sensation to light touch intact through distal right toes.  Palpable DP/PT pulses bilaterally. Ext: Warm, dry  Laboratory: Recent Labs  Lab 09/05/20 1950  WBC 13.2*  HGB 13.4  HCT 42.3  PLT 210   Recent Labs  Lab 09/05/20 1950  NA 138  K 3.6  CL 101  CO2 29  BUN 27*  CREATININE 1.04*  CALCIUM 9.1  GLUCOSE 198*     Imaging/Diagnostic Tests: DG Pelvis 1-2 Views  Result Date: 09/05/2020 CLINICAL DATA:  Fall, RIGHT hip pain. EXAM: PELVIS - 1-2 VIEW COMPARISON:  None. FINDINGS: Displaced fracture of the RIGHT femoral neck, possibly comminuted, with associated angulation deformity. Questionable fracture within the LEFT superior pubic ramus, but this is more likely artifactual related to patient positioning and overlying bowel loop. Remainder of the osseous structures of the pelvis appear intact and normally aligned. Extensive vascular calcifications. IMPRESSION: 1. Displaced fracture of the  RIGHT femoral neck, possibly comminuted, with associated angulation deformity. 2. Questionable fracture within the LEFT superior pubic ramus, but this is more likely artifactual related to patient positioning and overlying bowel loop. Consider CT of the pelvis for further characterization. Electronically Signed   By: Franki Cabot M.D.   On: 09/05/2020 19:35   DG FEMUR, MIN 2 VIEWS RIGHT  Result Date: 09/05/2020 CLINICAL DATA:  Fall, RIGHT hip pain. EXAM: RIGHT FEMUR 2 VIEWS COMPARISON:  None. FINDINGS: Displaced/comminuted fracture of the RIGHT femoral neck, subcapital to intratrochanteric region, with angulation deformity at the fracture site. Femoral head remains grossly well aligned with the acetabulum. Distal femur appears intact and normally aligned. Extensive vascular calcifications. Vascular stent in place from the level of the upper thigh to the knee. IMPRESSION: Displaced/comminuted fracture of the RIGHT femoral neck, subcapital to intratrochanteric region, with associated angulation deformity. Electronically Signed   By: Franki Cabot M.D.   On: 09/05/2020 19:31    Patriciaann Clan, DO 09/06/2020, 3:26 AM PGY-3 [place] Intern pager: [phone], text pages welcome

## 2020-09-06 NOTE — TOC CAGE-AID Note (Signed)
Transition of Care [facility]) - CAGE-AID Screening   Patient Details  Name: [name] MRN: [id number] Date of Birth: September 04, 1927      Elvina Sidle, RN  Trauma Response Nurse Phone Number: [phone] 09/06/2020, 5:50 PM   Clinical Narrative:   Pt's family denies pt drinking alcohol/no drug use.   CAGE-AID Screening:    Have You Ever Felt You Ought to Cut Down on Your Drinking or Drug Use?: No Have People Annoyed You By Critizing Your Drinking Or Drug Use?: No Have You Felt Bad Or Guilty About Your Drinking Or Drug Use?: No Have You Ever Had a Drink or Used Drugs First Thing In The Morning to Steady Your Nerves or to Get Rid of a Hangover?: No CAGE-AID Score: 0  Substance Abuse Education Offered: No

## 2020-09-06 NOTE — Progress Notes (Signed)
Patient stable.  Hard of hearing.  Plan for hip fracture fixation with Dr. Erlinda Hong tomorrow

## 2020-09-06 NOTE — Plan of Care (Signed)
  Problem: Pain Management: Goal: Pain level will decrease Outcome: Progressing   Problem: Activity: Goal: Ability to avoid complications of mobility impairment will improve Outcome: Progressing Goal: Ability to tolerate increased activity will improve Outcome: Progressing

## 2020-09-07 ENCOUNTER — Inpatient Hospital Stay (HOSPITAL_COMMUNITY): Payer: Medicare Other

## 2020-09-07 ENCOUNTER — Inpatient Hospital Stay (HOSPITAL_COMMUNITY): Payer: Medicare Other | Admitting: Anesthesiology

## 2020-09-07 ENCOUNTER — Encounter (HOSPITAL_COMMUNITY): Payer: Self-pay | Admitting: Family Medicine

## 2020-09-07 ENCOUNTER — Encounter (HOSPITAL_COMMUNITY)
Admission: EM | Disposition: A | Discharge status: Skilled Nursing Facility | Payer: Self-pay | Source: Home / Self Care | Attending: Family Medicine

## 2020-09-07 DIAGNOSIS — S72141A Displaced intertrochanteric fracture of right femur, initial encounter for closed fracture: Principal | ICD-10-CM

## 2020-09-07 HISTORY — PX: INTRAMEDULLARY (IM) NAIL INTERTROCHANTERIC: SHX5875

## 2020-09-07 LAB — MAGNESIUM: Magnesium: 1.7 mg/dL (ref 1.7–2.4)

## 2020-09-07 LAB — GLUCOSE, CAPILLARY
Glucose-Capillary: 179 mg/dL — ABNORMAL HIGH (ref 70–99)
Glucose-Capillary: 191 mg/dL — ABNORMAL HIGH (ref 70–99)
Glucose-Capillary: 205 mg/dL — ABNORMAL HIGH (ref 70–99)
Glucose-Capillary: 169 mg/dL — ABNORMAL HIGH (ref 70–99)
Glucose-Capillary: 202 mg/dL — ABNORMAL HIGH (ref 70–99)

## 2020-09-07 LAB — CBC
HCT: 42.8 % (ref 36.0–46.0)
Hemoglobin: 13.3 g/dL (ref 12.0–15.0)
MCH: 28.7 pg (ref 26.0–34.0)
MCHC: 31.1 g/dL (ref 30.0–36.0)
MCV: 92.2 fL (ref 80.0–100.0)
Platelets: 195 10*3/uL (ref 150–400)
RBC: 4.64 MIL/uL (ref 3.87–5.11)
RDW: 16.1 % — ABNORMAL HIGH (ref 11.5–15.5)
WBC: 14.3 10*3/uL — ABNORMAL HIGH (ref 4.0–10.5)
nRBC: 0 % (ref 0.0–0.2)

## 2020-09-07 LAB — BASIC METABOLIC PANEL
Anion gap: 8 (ref 5–15)
BUN: 31 mg/dL — ABNORMAL HIGH (ref 8–23)
CO2: 30 mmol/L (ref 22–32)
Calcium: 9.4 mg/dL (ref 8.9–10.3)
Chloride: 97 mmol/L — ABNORMAL LOW (ref 98–111)
Creatinine, Ser: 1.21 mg/dL — ABNORMAL HIGH (ref 0.44–1.00)
GFR, Estimated: 42 mL/min — ABNORMAL LOW (ref >60)
Glucose, Bld: 174 mg/dL — ABNORMAL HIGH (ref 70–99)
Potassium: 3.9 mmol/L (ref 3.5–5.1)
Sodium: 135 mmol/L (ref 135–145)

## 2020-09-07 SURGERY — FIXATION, FRACTURE, INTERTROCHANTERIC, WITH INTRAMEDULLARY ROD
Anesthesia: General | Laterality: Right

## 2020-09-07 MED ORDER — PROPOFOL 10 MG/ML IV BOLUS
INTRAVENOUS | Status: DC | PRN
  Administered 2020-09-07: 70 mg via INTRAVENOUS

## 2020-09-07 MED ORDER — FENTANYL CITRATE (PF) 250 MCG/5ML IJ SOLN
INTRAMUSCULAR | Status: AC
  Filled 2020-09-07: qty 5

## 2020-09-07 MED ORDER — DEXMEDETOMIDINE (PRECEDEX) IN NS 20 MCG/5ML (4 MCG/ML) IV SYRINGE
PREFILLED_SYRINGE | INTRAVENOUS | Status: DC | PRN
  Administered 2020-09-07 (×3): 4 ug via INTRAVENOUS
  Administered 2020-09-07 (×2): 8 ug via INTRAVENOUS

## 2020-09-07 MED ORDER — METHOCARBAMOL 1000 MG/10ML IJ SOLN
500.0000 mg | Freq: Four times a day (QID) | INTRAVENOUS | Status: DC | PRN
  Filled 2020-09-07: qty 5

## 2020-09-07 MED ORDER — POLYETHYLENE GLYCOL 3350 17 G PO PACK: 17.0000 g | PACK | Freq: Every day | ORAL | Status: DC | PRN

## 2020-09-07 MED ORDER — FENTANYL CITRATE (PF) 250 MCG/5ML IJ SOLN
INTRAMUSCULAR | Status: DC | PRN
  Administered 2020-09-07: 50 ug via INTRAVENOUS
  Administered 2020-09-07: 25 ug via INTRAVENOUS
  Administered 2020-09-07: 50 ug via INTRAVENOUS

## 2020-09-07 MED ORDER — ONDANSETRON HCL 4 MG/2ML IJ SOLN
INTRAMUSCULAR | Status: DC | PRN
  Administered 2020-09-07: 4 mg via INTRAVENOUS

## 2020-09-07 MED ORDER — PHENYLEPHRINE HCL-NACL 10-0.9 MG/250ML-% IV SOLN
INTRAVENOUS | Status: DC | PRN
  Administered 2020-09-07: 40 ug/min via INTRAVENOUS

## 2020-09-07 MED ORDER — HYDROMORPHONE HCL 1 MG/ML IJ SOLN: 0.5000 mg | INTRAMUSCULAR | Status: DC | PRN

## 2020-09-07 MED ORDER — SODIUM CHLORIDE 0.9 % IV SOLN: INTRAVENOUS | Status: DC

## 2020-09-07 MED ORDER — TRANEXAMIC ACID-NACL 1000-0.7 MG/100ML-% IV SOLN: 1000.0000 mg | Freq: Once | INTRAVENOUS | Status: DC

## 2020-09-07 MED ORDER — OXYCODONE HCL 5 MG PO TABS
10.0000 mg | ORAL_TABLET | ORAL | Status: DC | PRN
  Administered 2020-09-11: 10 mg via ORAL

## 2020-09-07 MED ORDER — TRANEXAMIC ACID 1000 MG/10ML IV SOLN
INTRAVENOUS | Status: DC | PRN
  Administered 2020-09-07: 2000 mg via TOPICAL

## 2020-09-07 MED ORDER — METHOCARBAMOL 500 MG PO TABS
500.0000 mg | ORAL_TABLET | Freq: Four times a day (QID) | ORAL | Status: DC | PRN
  Administered 2020-09-08 – 2020-09-14 (×5): 500 mg via ORAL
  Filled 2020-09-07 (×5): qty 1

## 2020-09-07 MED ORDER — CHLORHEXIDINE GLUCONATE 0.12 % MT SOLN
OROMUCOSAL | Status: AC
  Administered 2020-09-07: 15 mL via OROMUCOSAL
  Filled 2020-09-07: qty 15

## 2020-09-07 MED ORDER — OXYCODONE HCL 5 MG PO TABS
5.0000 mg | ORAL_TABLET | ORAL | Status: DC | PRN
  Administered 2020-09-09: 10 mg via ORAL
  Filled 2020-09-07 (×2): qty 2
  Filled 2020-09-07: qty 1

## 2020-09-07 MED ORDER — MAGNESIUM CITRATE PO SOLN: 1.0000 | Freq: Once | ORAL | Status: DC | PRN

## 2020-09-07 MED ORDER — ORAL CARE MOUTH RINSE: 15.0000 mL | Freq: Once | OROMUCOSAL | Status: AC

## 2020-09-07 MED ORDER — DOCUSATE SODIUM 100 MG PO CAPS
100.0000 mg | ORAL_CAPSULE | Freq: Two times a day (BID) | ORAL | Status: DC
  Administered 2020-09-08 (×2): 100 mg via ORAL
  Filled 2020-09-07 (×3): qty 1

## 2020-09-07 MED ORDER — ACETAMINOPHEN 500 MG PO TABS
1000.0000 mg | ORAL_TABLET | Freq: Four times a day (QID) | ORAL | Status: AC
  Administered 2020-09-08 (×2): 1000 mg via ORAL
  Filled 2020-09-07 (×2): qty 2

## 2020-09-07 MED ORDER — LACTATED RINGERS IV SOLN: INTRAVENOUS | Status: DC

## 2020-09-07 MED ORDER — FENTANYL CITRATE (PF) 100 MCG/2ML IJ SOLN
25.0000 ug | INTRAMUSCULAR | Status: DC | PRN
  Administered 2020-09-07 (×3): 25 ug via INTRAVENOUS

## 2020-09-07 MED ORDER — 0.9 % SODIUM CHLORIDE (POUR BTL) OPTIME
TOPICAL | Status: DC | PRN
  Administered 2020-09-07: 1000 mL

## 2020-09-07 MED ORDER — ONDANSETRON HCL 4 MG PO TABS: 4.0000 mg | ORAL_TABLET | Freq: Four times a day (QID) | ORAL | Status: DC | PRN

## 2020-09-07 MED ORDER — CEFAZOLIN SODIUM-DEXTROSE 2-4 GM/100ML-% IV SOLN
2.0000 g | Freq: Two times a day (BID) | INTRAVENOUS | Status: DC
  Administered 2020-09-08: 2 g via INTRAVENOUS
  Filled 2020-09-07 (×2): qty 100

## 2020-09-07 MED ORDER — CIPROFLOXACIN HCL 500 MG PO TABS: 500.0000 mg | ORAL_TABLET | ORAL | Status: AC

## 2020-09-07 MED ORDER — ACETAMINOPHEN 325 MG PO TABS: 325.0000 mg | ORAL_TABLET | Freq: Four times a day (QID) | ORAL | Status: DC | PRN

## 2020-09-07 MED ORDER — MENTHOL 3 MG MT LOZG: 1.0000 | LOZENGE | OROMUCOSAL | Status: DC | PRN

## 2020-09-07 MED ORDER — FENTANYL CITRATE (PF) 100 MCG/2ML IJ SOLN: INTRAMUSCULAR | Status: DC | PRN

## 2020-09-07 MED ORDER — FENTANYL CITRATE (PF) 100 MCG/2ML IJ SOLN
INTRAMUSCULAR | Status: AC
  Filled 2020-09-07: qty 2

## 2020-09-07 MED ORDER — ONDANSETRON HCL 4 MG/2ML IJ SOLN: 4.0000 mg | Freq: Four times a day (QID) | INTRAMUSCULAR | Status: DC | PRN

## 2020-09-07 MED ORDER — SORBITOL 70 % SOLN: 30.0000 mL | Freq: Every day | Status: DC | PRN

## 2020-09-07 MED ORDER — OXYCODONE-ACETAMINOPHEN 5-325 MG PO TABS: 1.0000 | ORAL_TABLET | Freq: Three times a day (TID) | ORAL | 0 refills | Status: DC | PRN

## 2020-09-07 MED ORDER — PHENOL 1.4 % MT LIQD: 1.0000 | OROMUCOSAL | Status: DC | PRN

## 2020-09-07 MED ORDER — CHLORHEXIDINE GLUCONATE 0.12 % MT SOLN: 15.0000 mL | Freq: Once | OROMUCOSAL | Status: AC

## 2020-09-07 MED ORDER — ALUM & MAG HYDROXIDE-SIMETH 200-200-20 MG/5ML PO SUSP
30.0000 mL | ORAL | Status: DC | PRN
  Administered 2020-09-08 (×2): 30 mL via ORAL
  Filled 2020-09-07 (×2): qty 30

## 2020-09-07 SURGICAL SUPPLY — 45 items
BIT DRILL INTERTAN LAG SCREW (BIT) ×2 IMPLANT
BIT DRILL SHORT 4.0 (BIT) IMPLANT
BNDG COHESIVE 4X5 TAN STRL (GAUZE/BANDAGES/DRESSINGS) ×3 IMPLANT
BNDG COHESIVE 6X5 TAN STRL LF (GAUZE/BANDAGES/DRESSINGS) IMPLANT
BNDG GAUZE ELAST 4 BULKY (GAUZE/BANDAGES/DRESSINGS) ×3 IMPLANT
COVER PERINEAL POST (MISCELLANEOUS) ×3 IMPLANT
COVER SURGICAL LIGHT HANDLE (MISCELLANEOUS) ×3 IMPLANT
COVER WAND RF STERILE (DRAPES) ×3 IMPLANT
DRAPE C-ARMOR (DRAPES) ×3 IMPLANT
DRAPE STERI IOBAN 125X83 (DRAPES) ×3 IMPLANT
DRESSING MEPILEX FLEX 4X4 (GAUZE/BANDAGES/DRESSINGS) IMPLANT
DRILL BIT SHORT 4.0 (BIT)
DRSG MEPILEX BORDER 4X4 (GAUZE/BANDAGES/DRESSINGS) ×3 IMPLANT
DRSG MEPILEX BORDER 4X8 (GAUZE/BANDAGES/DRESSINGS) ×3 IMPLANT
DRSG MEPILEX FLEX 4X4 (GAUZE/BANDAGES/DRESSINGS) ×3
DRSG PAD ABDOMINAL 8X10 ST (GAUZE/BANDAGES/DRESSINGS) ×6 IMPLANT
DURAPREP 26ML APPLICATOR (WOUND CARE) ×3 IMPLANT
ELECT REM PT RETURN 9FT ADLT (ELECTROSURGICAL) ×3
ELECTRODE REM PT RTRN 9FT ADLT (ELECTROSURGICAL) ×1 IMPLANT
GLOVE ECLIPSE 7.0 STRL STRAW (GLOVE) ×3 IMPLANT
GLOVE SKINSENSE NS SZ7.5 (GLOVE) ×4
GLOVE SKINSENSE STRL SZ7.5 (GLOVE) ×2 IMPLANT
GLOVE SURG UNDER POLY LF SZ7 (GLOVE) ×3 IMPLANT
GOWN STRL REIN XL XLG (GOWN DISPOSABLE) ×3 IMPLANT
GUIDE PIN 3.2X343 (PIN) ×2
GUIDE PIN 3.2X343MM (PIN) ×6
KIT BASIN OR (CUSTOM PROCEDURE TRAY) ×3 IMPLANT
KIT TURNOVER KIT B (KITS) ×3 IMPLANT
MANIFOLD NEPTUNE II (INSTRUMENTS) ×3 IMPLANT
NAIL TRIGEN RT 10MMX36CM-125 (Nail) ×2 IMPLANT
NS IRRIG 1000ML POUR BTL (IV SOLUTION) ×3 IMPLANT
PACK GENERAL/GYN (CUSTOM PROCEDURE TRAY) ×3 IMPLANT
PAD ARMBOARD 7.5X6 YLW CONV (MISCELLANEOUS) ×6 IMPLANT
PAD CAST 4YDX4 CTTN HI CHSV (CAST SUPPLIES) ×2 IMPLANT
PADDING CAST COTTON 4X4 STRL (CAST SUPPLIES) ×6
PIN GUIDE 3.2X343MM (PIN) IMPLANT
SCREW LAG COMPR KIT 85/80 (Screw) ×2 IMPLANT
STAPLER VISISTAT 35W (STAPLE) ×3 IMPLANT
SUT VIC AB 0 CT1 27 (SUTURE) ×3
SUT VIC AB 0 CT1 27XBRD ANBCTR (SUTURE) ×1 IMPLANT
SUT VIC AB 2-0 CT1 27 (SUTURE) ×3
SUT VIC AB 2-0 CT1 TAPERPNT 27 (SUTURE) ×1 IMPLANT
TOWEL GREEN STERILE (TOWEL DISPOSABLE) ×3 IMPLANT
TOWEL GREEN STERILE FF (TOWEL DISPOSABLE) ×3 IMPLANT
WATER STERILE IRR 1000ML POUR (IV SOLUTION) ×3 IMPLANT

## 2020-09-07 NOTE — Progress Notes (Addendum)
Family Medicine Teaching Service Daily Progress Note Intern Pager: [phone]  Patient name: [name] Medical record number: [id number] Date of birth: 1927-08-14 Age: 87 y.o. Gender: female  Primary Care Provider: Ria Bush, MD Consultants: [name] Code Status: Full  Pt Overview and Major Events to Date:  6/4 - Admitted  Assessment and Plan: [name] a 87 y.o.femalepresenting with right hip fracture after mechanical fall at home. PMH is significant forparoxsmal afib on eliquis, h/o provoked DVT, T2DM, HLD, HFpEF (EF 50-55%), depression/anxiety, GERD, CAD, CKD III, h/o skin cancer, chronic pain 2/2 spinal stenosis  Rightfemoral neck fracture 2/2 Mechanical Fall (likely osteoporotic fracture):Acute.  Plan for ORIF today. Pain moderately controlled. Vit D level 43.11. - Orthopedics following, appreciate recommendations  -Pain control: Scheduled tylenol/ice,Norco 5-325 every 6 for moderate, fentanyl 25 mcg every 2 for severe, will titrate as needed -PT/OT/TOC consult, will likely need placement after surgery -Hold home Eliquis, will resume once cleared by [name]  Leukocytosis, c/f UTI WBC elevated to 14.3, UA collected yesterday with few bacteria and moderate leukocytes. Temperature currently 100F. Patient not currently complaining of dysuria, urine culture was not collected and will need to be collected before initiation of antibiotics if appropriate.  - Urine culture - Monitor for urinary symptoms and fever curve - CBC in the AM  ParoxysmalAtrial Fibrillation on chronic anticoagulation:Improved.  -Monitor telemetry -Hold home Eliquis BID (no recent history of DVT/CVA)for upcoming surgical repair -Continue home metoprolol 50 BID and diltiazem 120mg  daily   T2DM:Chronic, well controlled (A1c 7.3). Glucose 149-200 and patient received total of 3U aspart in the last 24 hours. -Hold home meds (Januvia 50mg  qd and Pioglitazone 15mg  QD) - sSSI with  meals  -CBG with meals/bedtime - Consider adding lantus if CBG consistently elevated   UVO:ZDGUYQI, stable. SBP normotensive after admit.  -Continue home metoprolol and diltiazem   HKV:QQVZDGL, stable. Home meds: Pravastatin 20mg  QD. -Start pravastatin after surgery  GERD  H/o OVF:IEPPIRJ, stable. - Continue home protonix BID --Mylanta/maalox PRN   Depression/GAD:Chronic, stable.  Home meds: Zoloft 50mg  QD and Xanax 0.5mg  PRN   HFpEF CAD s/p CABG in 1996:Chronic, stable. Associated with chronic SOB.She follows with Dr. Nahsar,cardiology [facility],and most recently seen in 06/2020.Last echo (09/02/19) with EF 50-55%. - Continue home metoprolol -Continue home Lasix 40 mg twice daily -Monitor telemetry/if any symptoms  CKD IIIa:Chronic, stable. Currently at baseline 1.0-1.2 but elevated from admission (1.04>1.07>1.21) - AM BMP  Chronic Pain 2/2 Spinal Stenosis of Lumbosacral Region: Home meds; Norco 5-325mg : 1/2 tablet BID PRN. -Acute pain regimen as above  - Senna BID - Miralax daily  FEN/GI:Cardiac diet, n.p.o. at midnight tomorrow night Prophylaxis:Heparin SQ, holding home Eliquis   Status is: Inpatient  Remains inpatient appropriate because:Ongoing active pain requiring inpatient pain management and Inpatient level of care appropriate due to severity of illness   Dispo: The patient is from: Home              Anticipated d/c is to: SNF              Patient currently is not medically stable to d/c.   Difficult to place patient No    Subjective:  Patient states that she is doing okay this morning, she reports that her right hip hurts her stomach hurts a [name] bit but she thinks it is stool.  Objective: Temp:  [97.6 F (36.4 C)-99.7 F (37.6 C)] 99.7 F (37.6 C) (06/05 2100) Pulse Rate:  [55-90] 90 (06/05 2100) Resp:  [14-16] 14 (  06/05 2100) BP: (110-120)/(49-93) 120/59 (06/05 2100) SpO2:  [93 %-99 %] 93 % (06/05 2100) Physical  Exam: General: NAD, supine in bed, somewhat tangential conversation Cardiovascular: Irregular rate and rhythm, nontachycardic Respiratory: Clear bilaterally, no increased WOB, on 1.5 L O2 Abdomen: Soft, mildly tender tender diffusely no rebound tenderness, no guarding Extremities: Right leg externally rotated and shortened compared to left, able to move all extremities, sensation intact, dorsal pedal is pulses present, right hip tender to palpation  Laboratory: Recent Labs  Lab 09/05/20 1950 09/06/20 0233 09/07/20 0333  WBC 13.2* 14.0* 14.3*  HGB 13.4 12.8 13.3  HCT 42.3 41.5 42.8  PLT 210 203 195   Recent Labs  Lab 09/05/20 1950 09/06/20 0233 09/07/20 0333  NA 138 136 135  K 3.6 3.2* 3.9  CL 101 98 97*  CO2 29 25 30   BUN 27* 25* 31*  CREATININE 1.04* 1.07* 1.21*  CALCIUM 9.1 8.5* 9.4  GLUCOSE 198* 430* 174*    Imaging/Diagnostic Tests: DG CHEST PORT 1 VIEW  Result Date: 09/06/2020 CLINICAL DATA:  Cardiac palpitations and chest pain EXAM: PORTABLE CHEST 1 VIEW COMPARISON:  Sep 02, 2019 FINDINGS: No edema or airspace opacity. Heart is enlarged with pulmonary vascularity within normal limits. No adenopathy. Patient is status post coronary artery bypass grafting. There is aortic atherosclerosis. There is degenerative change in the right shoulder with superior migration of the right humeral head. Calcification noted in right carotid artery. IMPRESSION: Stable cardiac prominence with postoperative change. No edema or airspace opacity. Aortic Atherosclerosis (ICD10-I70.0). Calcification also noted in right carotid artery. Apparent chronic rotator cuff tear on the right. Electronically Signed   By: Lowella Grip III M.D.   On: 09/06/2020 08:22     [name] Patience, DO 09/07/2020, 5:27 AM PGY-1, [place] Intern pager: [phone], text pages welcome

## 2020-09-07 NOTE — Progress Notes (Signed)
PT Cancellation Note  Patient Details Name: DAWSON HOLLMAN MRN: [id number] DOB: 1927-11-28   Cancelled Treatment:    Reason Eval/Treat Not Completed: Patient at procedure or test/unavailable. Pt in OR for IM nail to R hip fracture. PT to return as able, as appropriate s/p surgery.  Kittie Plater, PT, DPT Acute Rehabilitation Services Pager #: [phone] Office #: [phone]    Berline Lopes 09/07/2020, 7:54 AM

## 2020-09-07 NOTE — Progress Notes (Signed)
FPTS Interim Progress Note-Post op PM check  S:Post-op PM check on patient Patient evaluated at bedside this PM along with her granddaughter an grandson in the room. She is able to state her name, age and name of hospital along with her procedure in the afternoon. C/o of mild pain but able to follows all commands appropriately.   O: BP (!) 100/51   Pulse 64   Temp (!) 97.5 F (36.4 C) (Oral)   Resp 19   Ht 4\' 8"  (1.422 m)   Wt 137 lb 5.6 oz (62.3 kg)   SpO2 98%   BMI 30.79 kg/m    General: Pleasant elderly female lying comfortably in bed Neuro: Alert, awake, oriented to time, place and person Psych: Normal mood and affect Cardio: Irregular rate and rhythm Abdomen: Soft, non-distended, non-tender, BS+ Extremities: Rt leg-able to wiggle toes, intact light touch, no warmth, edema, redness noted around bandages and in leg as whole.  A/P: Right femoral fracture s/p ORIF Patient c/o of mild pain. Able to wiggle her toes and able to feel light touch on her R leg. Does not move beyond wiggling toes at this point. No redness, warmth, edema , bleeding noted around bandages. 2 bandaged in place looks clean and intact. -- Ortho following, appreciate their recs -- Weight bearing as tolerated  -- Return in 2 weeks for staple removal -- Pain regimen    --Tylenol 650 mg q6h    -- fentanyl 25 mcg q2h PRN    --Norco/vicodin q6h PRN  Honor Junes, MD 09/07/2020, 9:05 PM PGY-1, [place] Medicine Service pager [phone]

## 2020-09-07 NOTE — Progress Notes (Signed)
OT Cancellation Note  Patient Details Name: [name] MRN: [id number] DOB: 11-24-1927   Cancelled Treatment:    Reason Eval/Treat Not Completed: Patient at procedure or test/ unavailable;Active bedrest order;Other (comment) (Pt in OR for IM nail to R hip fracture. OT to return as able, as appropriate s/p surgery.)  Julien Oscar A Mallory Enriques 09/07/2020, 8:07 AM

## 2020-09-07 NOTE — Plan of Care (Signed)
°  Problem: Activity: °Goal: Ability to avoid complications of mobility impairment will improve °Outcome: Progressing °Goal: Ability to tolerate increased activity will improve °Outcome: Progressing °  °Problem: Education: °Goal: Verbalization of understanding the information provided will improve °Outcome: Progressing °  °

## 2020-09-07 NOTE — Progress Notes (Signed)
FPTS Interim Progress Note  Patient sleeping and resting comfortably.  Rounded with primary RN.  No concerns voiced.  No orders required.  Appreciated nightly round.  Today's Vitals   09/06/20 1523 09/06/20 1645 09/06/20 2100 09/06/20 2130  BP: (!) 110/93  (!) 120/59   Pulse: 88  90   Resp: 16  14   Temp: 98.2 F (36.8 C)  99.7 F (37.6 C)   TempSrc: Oral  Oral   SpO2: 94%  93%   Weight:      Height:      PainSc:  0-No pain  6    NPO >MN for OR in am  Carollee Leitz, MD Family Medicine Residency

## 2020-09-07 NOTE — Transfer of Care (Signed)
Immediate Anesthesia Transfer of Care Note  Patient: [name]  Procedure(s) Performed: INTRAMEDULLARY (IM) NAIL INTERTROCHANTRIC (Right )  Patient Location: PACU  Anesthesia Type:General  Level of Consciousness: confused and responds to stimulation  Airway & Oxygen Therapy: Patient Spontanous Breathing and Patient connected to face mask oxygen  Post-op Assessment: Report given to RN and Post -op Vital signs reviewed and stable  Post vital signs: Reviewed and stable  Last Vitals:  Vitals Value Taken Time  BP 112/77 09/07/20 1552  Temp    Pulse 123 09/07/20 1555  Resp 26 09/07/20 1555  SpO2 99 % 09/07/20 1555  Vitals shown include unvalidated device data.  Last Pain:  Vitals:   09/07/20 1348  TempSrc:   PainSc: 7       Patients Stated Pain Goal: 2 (59/93/57 0177)  Complications: No complications documented.

## 2020-09-07 NOTE — Anesthesia Postprocedure Evaluation (Signed)
Anesthesia Post Note  Patient: [name]  Procedure(s) Performed: INTRAMEDULLARY (IM) NAIL INTERTROCHANTRIC (Right )     Patient location during evaluation: PACU Anesthesia Type: General Level of consciousness: awake and alert Pain management: pain level controlled Vital Signs Assessment: post-procedure vital signs reviewed and stable Respiratory status: spontaneous breathing, nonlabored ventilation and respiratory function stable Cardiovascular status: blood pressure returned to baseline and stable Postop Assessment: no apparent nausea or vomiting Anesthetic complications: no   No complications documented.  Last Vitals:  Vitals:   09/07/20 1630 09/07/20 1635  BP:  100/64  Pulse:  87  Resp: 16 19  Temp:    SpO2:  96%    Last Pain:  Vitals:   09/07/20 1348  TempSrc:   PainSc: 7                  Amado Andal E Marykatherine Sherwood

## 2020-09-07 NOTE — Anesthesia Procedure Notes (Signed)
Procedure Name: LMA Insertion Date/Time: 09/07/2020 11:38 AM Performed by: Lowella [name], CRNA Pre-anesthesia Checklist: Patient identified, Emergency Drugs available, Suction available and Patient being monitored Patient Re-evaluated:Patient Re-evaluated prior to induction Oxygen Delivery Method: Circle System Utilized Preoxygenation: Pre-oxygenation with 100% oxygen Induction Type: IV induction Ventilation: Mask ventilation without difficulty LMA: LMA inserted LMA Size: 3.0 Number of attempts: 1 Airway Equipment and Method: Bite block Placement Confirmation: positive ETCO2 Tube secured with: Tape Dental Injury: Teeth and Oropharynx as per pre-operative assessment

## 2020-09-07 NOTE — Anesthesia Preprocedure Evaluation (Addendum)
Anesthesia Evaluation  Patient identified by MRN, date of birth, ID band Patient awake    Reviewed: Allergy & Precautions, NPO status , Patient's Chart, lab work & pertinent test results  History of Anesthesia Complications Negative for: history of anesthetic complications  Airway Mallampati: III  TM Distance: >3 FB Neck ROM: Full    Dental  (+) Dental Advisory Given   Pulmonary neg pulmonary ROS, former smoker,    Pulmonary exam normal        Cardiovascular hypertension, + CAD, + CABG (1998), + Peripheral Vascular Disease, +CHF and + DVT  Normal cardiovascular exam     Neuro/Psych Anxiety Depression negative neurological ROS     GI/Hepatic Neg liver ROS, PUD, GERD  ,  Endo/Other  diabetes, Type 2  Renal/GU Renal InsufficiencyRenal disease  negative genitourinary   Musculoskeletal negative musculoskeletal ROS (+)   Abdominal   Peds  Hematology negative hematology ROS (+) Eliquis   Anesthesia Other Findings  Echo 09/02/19: EF 50-55%, moderately reduced RV systolic function, mod pulm HTN, mod MR, mild-mod TR, cannot exclude bicuspid aortic morphology, no aortic stenosis  Reproductive/Obstetrics                            Anesthesia Physical Anesthesia Plan  ASA: III  Anesthesia Plan: General   Post-op Pain Management:    Induction: Intravenous  PONV Risk Score and Plan: 3 and Ondansetron, Dexamethasone, Midazolam and Treatment may vary due to age or medical condition  Airway Management Planned: LMA  Additional Equipment: None  Intra-op Plan:   Post-operative Plan: Extubation in OR  Informed Consent: I have reviewed the patients History and Physical, chart, labs and discussed the procedure including the risks, benefits and alternatives for the proposed anesthesia with the patient or authorized representative who has indicated his/her understanding and acceptance.     Dental  advisory given and Consent reviewed with POA  Plan Discussed with:   Anesthesia Plan Comments:        Anesthesia Quick Evaluation

## 2020-09-07 NOTE — Op Note (Signed)
Date of Surgery: 09/07/2020  INDICATIONS: Ms. Amburn is a 87 y.o.-year-old female who sustained a right hip fracture. The risks and benefits of the procedure discussed with the patient prior to the procedure and all questions were answered; consent was obtained.  PREOPERATIVE DIAGNOSIS: right intertrochanteric hip fracture   POSTOPERATIVE DIAGNOSIS: Same   PROCEDURE: Open treatment of intertrochanteric fracture with intramedullary implant. CPT [phone]   SURGEON: N. Eduard Roux, M.D.   ASSIST: Madalyn Rob, [place]  ANESTHESIA: general   IV FLUIDS AND URINE: See anesthesia record   ESTIMATED BLOOD LOSS: 200 cc  IMPLANTS: [employer] InterTAN 10 x 34, 85/80 compression screws  DRAINS: None.   COMPLICATIONS: see description of procedure.   DESCRIPTION OF PROCEDURE: The patient was brought to the operating room and placed supine on the operating table. The patient's leg had been signed prior to the procedure. The patient had the anesthesia placed by the anesthesiologist. The prep verification and incision time-outs were performed to confirm that this was the correct patient, site, side and location. The patient had an SCD on the opposite lower extremity. The patient did receive antibiotics prior to the incision and was re-dosed during the procedure as needed at indicated intervals. The patient was positioned on the fracture table with the table in traction and internal rotation to reduce the hip. The well leg was placed in a scissor position and all bony prominences were well-padded. The patient had the lower extremity prepped and draped in the standard surgical fashion. The incision was made 4 finger breadths superior to the greater trochanter. A guide pin was inserted into the tip of the greater trochanter under fluoroscopic guidance. An opening reamer was used to gain access to the femoral canal. The nail length was measured and inserted down the femoral canal to its proper depth.  The appropriate version of insertion for the lag screw was found under fluoroscopy. A pin was inserted up the femoral neck through the jig. Then, a second antirotation pin was inserted inferior to the first pin. The length of the lag screw was then measured. The lag screw was inserted as near to center-center in the head as possible. The antirotation pin was then taken out and an interdigitating compression screw was placed in its place. The leg was taken out of traction, then the interdigitating compression screw was used to compress across the fracture. Compression was visualized on serial xrays.  The lateral cortex of the proximal femur was intact therefore I decided not to place a distal interlocking screw.   The wound was copiously irrigated with saline and the subcutaneous layer closed with 2.0 vicryl and the skin was reapproximated with staples. The wounds were cleaned and dried a final time and a sterile dressing was placed. The hip was taken through a range of motion at the end of the case under fluoroscopic imaging to visualize the approach-withdraw phenomenon and confirm implant length in the head. The patient was then awakened from anesthesia and taken to the recovery room in stable condition. All counts were correct at the end of the case.   Tawanna Cooler was necessary for opening, closing, retracting, limb positioning and overall facilitation and completion of the surgery.  POSTOPERATIVE PLAN: The patient will be weight bearing as tolerated and will return in 2 weeks for staple removal and the patient will receive DVT prophylaxis based on other medications, activity level, and risk ratio of bleeding to thrombosis.   Azucena Cecil, MD [facility]  3:22 PM

## 2020-09-07 NOTE — Consult Note (Signed)
Reason for Consult:Right hip fx Referring Physician: Chrisandra Netters Time called: 0730 Time at bedside: 0842   Jadelyn Elks Weir is an 87 y.o. female.  HPI: Ty was at home and was getting something off the counter. After she had gotten it and started to move her RW got caught (she thinks) and she fell. She had immediate right hip pain but was able to get up and walk about 30 feet to sit down. The next time she got up she felt a pop in the right hip and had severe pain and could not bear weight. She was brought to the ED where x-rays showed a hip fx and orthopedic surgery was consulted. Due to her Eliquis use surgery was delayed for 48h. She lives at home with a couple of roommates and ambulates with a RW.  Past Medical History:  Diagnosis Date  . Actinic keratosis 01/22/2020   right forearm  . Acute deep vein thrombosis (DVT) of distal vein of right lower extremity ([place]) 12/2013   Acute thrombus of R proximal gastrocnemius vein. Symptomatic provoked distal DVT  . Anxiety   . Basal cell carcinoma 10/30/2018   R nasal bridge, treated with Moh's  . Basal cell carcinoma 01/22/2020   left forearm, nodular  . Cellulitis of leg, left 01/16/2014  . CHF (congestive heart failure) ([place])   . Chronic back pain 03/2012   lumbar DDD with herniation - s/p L S1 transforaminal ESI (10/2012 Dr. Sharlet Salina)  . Chronic venous insufficiency   . Coronary artery disease    CABG 1998  . Cystocele   . Depression   . Diabetes mellitus    type 2, followed by endo.  . Diastolic dysfunction   . Diverticulosis   . Hemorrhoids   . History of heart attack   . Hx of echocardiogram    a. Echo 10/13: mild LVH, EF 55-60%, Gr 1 diast dysfn, PASP 32  . Hyperlipidemia   . Hypertension   . Irritable bowel syndrome   . Microcytic anemia   . PAD (peripheral artery disease) ([place]) 2013   ABI: R 0.91, L 0.83  . Postmenopausal osteoporosis 01/2019   T score of -2.6  . PUD (peptic ulcer disease)   . Varicose veins    . Venous ulcer of leg ([place]) 12/12/2013   Required prolonged treatment by wound center  . Viral gastroenteritis due to [place] virus 05/13/2015   hospitalization    Past Surgical History:  Procedure Laterality Date  . ABDOMINAL HYSTERECTOMY  1975   TAH,BSO  . ABI  2013   R 0.91, L 0.83  . APPENDECTOMY     per pt report  . CARDIOVASCULAR STRESS TEST  2015   low risk myoview ([place])  . CATARACT SURGERY    . CHOLECYSTECTOMY    . COLONOSCOPY  02/2010   extensive diverticulosis throughout, small internal hemorrhoids, rec rpt 5 yrs (Dr. Allyn Kenner)  . CORONARY ARTERY BYPASS GRAFT  1998  . dexa scan  09/2011   femur -0.8, forearm 0.6 => normal  . ENDOVENOUS ABLATION SAPHENOUS VEIN W/ LASER Left 03/2014   Kellie Simmering  . ESI  2014   L S1 transforaminal ESI x2 ([place]) without improvement  . HAND SURGERY    . lexiscan myoview  03/2011   Small basal inferolateral and anterolateral reversible perfusion defect suggests ischemia.  EF was normal.   old infarct, no new ischemia  . LOWER EXTREMITY ANGIOGRAPHY Right 09/06/2018   Procedure: LOWER EXTREMITY ANGIOGRAPHY;  Surgeon: Algernon Huxley,  MD;  Location: [facility];  Service: Cardiovascular;  Laterality: Right;  . toe surgery    . TYMPANOPLASTY  1960s   R side  . Vault prolapse A & P repair  2009   Dr [facility]    Family History  Problem Relation Age of Onset  . Hypertension Mother   . Stroke Mother   . Diabetes Father   . Diabetes Sister   . Diabetes Brother   . Heart disease Son   . Colon cancer Neg Hx     Social History:  reports that she quit smoking about 41 years ago. She has never used smokeless tobacco. She reports that she does not drink alcohol and does not use drugs.  Allergies:  Allergies  Allergen Reactions  . Metformin And Related Diarrhea    Medications: I have reviewed the patient's current medications.  Results for orders placed or performed during the hospital encounter of 09/05/20 (from the  past 48 hour(s))  Basic metabolic panel     Status: Abnormal   Collection Time: 09/05/20  7:50 PM  Result Value Ref Range   Sodium 138 135 - 145 mmol/L   Potassium 3.6 3.5 - 5.1 mmol/L   Chloride 101 98 - 111 mmol/L   CO2 29 22 - 32 mmol/L   Glucose, Bld 198 (H) 70 - 99 mg/dL    Comment: Glucose reference range applies only to samples taken after fasting for at least 8 hours.   BUN 27 (H) 8 - 23 mg/dL   Creatinine, Ser 1.04 (H) 0.44 - 1.00 mg/dL   Calcium 9.1 8.9 - 10.3 mg/dL   GFR, Estimated 51 (L) >60 mL/min    Comment: (NOTE) Calculated using the CKD-EPI Creatinine Equation (2021)    Anion gap 8 5 - 15    Comment: Performed at [facility] [address] [place], [place] [address]  CBC     Status: Abnormal   Collection Time: 09/05/20  7:50 PM  Result Value Ref Range   WBC 13.2 (H) 4.0 - 10.5 K/uL   RBC 4.58 3.87 - 5.11 MIL/uL   Hemoglobin 13.4 12.0 - 15.0 g/dL   HCT 42.3 36.0 - 46.0 %   MCV 92.4 80.0 - 100.0 fL   MCH 29.3 26.0 - 34.0 pg   MCHC 31.7 30.0 - 36.0 g/dL   RDW 15.9 (H) 11.5 - 15.5 %   Platelets 210 150 - 400 K/uL   nRBC 0.0 0.0 - 0.2 %    Comment: Performed at [facility], [place] [address] [place], [place] [address]  Resp Panel by RT-PCR (Flu A&B, Covid) Nasopharyngeal Swab     Status: None   Collection Time: 09/05/20  7:50 PM   Specimen: Nasopharyngeal Swab; Nasopharyngeal(NP) swabs in vial transport medium  Result Value Ref Range   SARS Coronavirus 2 by RT PCR NEGATIVE NEGATIVE    Comment: (NOTE) SARS-CoV-2 target nucleic acids are NOT DETECTED.  The SARS-CoV-2 RNA is generally detectable in upper respiratory specimens during the acute phase of infection. The lowest concentration of SARS-CoV-2 viral copies this assay can detect is 138 copies/mL. A negative result does not preclude SARS-Cov-2 infection and should not be used as the sole basis for treatment or other patient management decisions. A negative result may occur with  improper  specimen collection/handling, submission of specimen other than nasopharyngeal swab, presence of viral mutation(s) within the areas targeted by this assay, and inadequate number of viral copies(<138 copies/mL).  A negative result must be combined with clinical observations, patient history, and epidemiological information. The expected result is Negative.  Fact Sheet for Patients:  EntrepreneurPulse.com.au  Fact Sheet for Healthcare Providers:  IncredibleEmployment.be  This test is no t yet approved or cleared by the [place] FDA and  has been authorized for detection and/or diagnosis of SARS-CoV-2 by FDA under an Emergency Use Authorization (EUA). This EUA will remain  in effect (meaning this test can be used) for the duration of the COVID-19 declaration under Section 564(b)(1) of the Act, 21 U.S.C.section 360bbb-3(b)(1), unless the authorization is terminated  or revoked sooner.       Influenza A by PCR NEGATIVE NEGATIVE   Influenza B by PCR NEGATIVE NEGATIVE    Comment: (NOTE) The Xpert Xpress SARS-CoV-2/FLU/RSV plus assay is intended as an aid in the diagnosis of influenza from Nasopharyngeal swab specimens and should not be used as a sole basis for treatment. Nasal washings and aspirates are unacceptable for Xpert Xpress SARS-CoV-2/FLU/RSV testing.  Fact Sheet for Patients: EntrepreneurPulse.com.au  Fact Sheet for Healthcare Providers: IncredibleEmployment.be  This test is not yet approved or cleared by the [place] FDA and has been authorized for detection and/or diagnosis of SARS-CoV-2 by FDA under an Emergency Use Authorization (EUA). This EUA will remain in effect (meaning this test can be used) for the duration of the COVID-19 declaration under Section 564(b)(1) of the Act, 21 U.S.C. section 360bbb-3(b)(1), unless the authorization is terminated or revoked.  Performed at [facility], [place] [address], [place], [place] [address]   VITAMIN D 25 Hydroxy (Vit-D Deficiency, Fractures)     Status: None   Collection Time: 09/06/20  2:33 AM  Result Value Ref Range   Vit D, 25-Hydroxy 43.11 30 - 100 ng/mL    Comment: (NOTE) Vitamin D deficiency has been defined by the [place] practice guideline as a level of serum 25-OH  vitamin D less than 20 ng/mL (1,2). The Endocrine Society went on to  further define vitamin D insufficiency as a level between 21 and 29  ng/mL (2).  1. IOM (Institute of Medicine). 2010. Dietary reference intakes for  calcium and D. [place]: The [employer]. 2. Holick MF, Binkley [place], Bischoff-Ferrari HA, et al. Evaluation,  treatment, and prevention of vitamin D deficiency: an Endocrine  Society clinical practice guideline, JCEM. 2011 Jul; 96(7): 1911-30.  Performed at [facility], [place] [address] [place], [place] [address]   CBC     Status: Abnormal   Collection Time: 09/06/20  2:33 AM  Result Value Ref Range   WBC 14.0 (H) 4.0 - 10.5 K/uL   RBC 4.50 3.87 - 5.11 MIL/uL   Hemoglobin 12.8 12.0 - 15.0 g/dL   HCT 41.5 36.0 - 46.0 %   MCV 92.2 80.0 - 100.0 fL   MCH 28.4 26.0 - 34.0 pg   MCHC 30.8 30.0 - 36.0 g/dL   RDW 15.9 (H) 11.5 - 15.5 %   Platelets 203 150 - 400 K/uL   nRBC 0.0 0.0 - 0.2 %    Comment: Performed at [facility], [place] [address] [place], [place] [address]  Basic metabolic panel     Status: Abnormal   Collection Time: 09/06/20  2:33 AM  Result Value Ref Range   Sodium 136 135 - 145 mmol/L   Potassium 3.2 (L) 3.5 - 5.1 mmol/L   Chloride 98 98 - 111 mmol/L  CO2 25 22 - 32 mmol/L   Glucose, Bld 430 (H) 70 - 99 mg/dL    Comment: Glucose reference range applies only to samples taken after fasting for at least 8 hours.   BUN 25 (H) 8 - 23 mg/dL   Creatinine, Ser 1.07 (H) 0.44 - 1.00 mg/dL   Calcium 8.5 (L) 8.9 - 10.3 mg/dL   GFR, Estimated 49 (L) >60  mL/min    Comment: (NOTE) Calculated using the CKD-EPI Creatinine Equation (2021)    Anion gap 13 5 - 15    Comment: Performed at [facility] [address] [place], [place] [address]  Troponin I (High Sensitivity)     Status: None   Collection Time: 09/06/20  2:33 AM  Result Value Ref Range   Troponin I (High Sensitivity) 15 <18 ng/L    Comment: (NOTE) Elevated high sensitivity troponin I (hsTnI) values and significant  changes across serial measurements may suggest ACS but many other  chronic and acute conditions are known to elevate hsTnI results.  Refer to the "Links" section for chest pain algorithms and additional  guidance. Performed at [facility], [place] [address] [place], [place] [address]   Troponin I (High Sensitivity)     Status: None   Collection Time: 09/06/20  4:07 AM  Result Value Ref Range   Troponin I (High Sensitivity) 17 <18 ng/L    Comment: (NOTE) Elevated high sensitivity troponin I (hsTnI) values and significant  changes across serial measurements may suggest ACS but many other  chronic and acute conditions are known to elevate hsTnI results.  Refer to the "Links" section for chest pain algorithms and additional  guidance. Performed at [facility], [place] [address] [place], [place] [address]   Glucose, capillary     Status: Abnormal   Collection Time: 09/06/20  6:43 AM  Result Value Ref Range   Glucose-Capillary 290 (H) 70 - 99 mg/dL    Comment: Glucose reference range applies only to samples taken after fasting for at least 8 hours.  Glucose, capillary     Status: Abnormal   Collection Time: 09/06/20 11:30 AM  Result Value Ref Range   Glucose-Capillary 149 (H) 70 - 99 mg/dL    Comment: Glucose reference range applies only to samples taken after fasting for at least 8 hours.  Surgical pcr screen     Status: None   Collection Time: 09/06/20  1:29 PM   Specimen: Nasal Mucosa; Nasal Swab  Result Value Ref Range   MRSA, PCR  NEGATIVE NEGATIVE   Staphylococcus aureus NEGATIVE NEGATIVE    Comment: (NOTE) The Xpert SA Assay (FDA approved for NASAL specimens in patients 41 years of age and older), is one component of a comprehensive surveillance program. It is not intended to diagnose infection nor to guide or monitor treatment. Performed at [facility], [place] [address] [place], [place] [address]   Urinalysis, Complete w Microscopic Urine, Clean Catch     Status: Abnormal   Collection Time: 09/06/20  4:31 PM  Result Value Ref Range   Color, Urine YELLOW YELLOW   APPearance CLOUDY (A) CLEAR   Specific Gravity, Urine 1.013 1.005 - 1.030   pH 5.0 5.0 - 8.0   Glucose, UA NEGATIVE NEGATIVE mg/dL   Hgb urine dipstick NEGATIVE NEGATIVE   Bilirubin Urine NEGATIVE NEGATIVE   Ketones, ur NEGATIVE NEGATIVE mg/dL   Protein, ur NEGATIVE NEGATIVE mg/dL   Nitrite NEGATIVE NEGATIVE   Leukocytes,Ua  MODERATE (A) NEGATIVE   RBC / HPF 0-5 0 - 5 RBC/hpf   WBC, UA 6-10 0 - 5 WBC/hpf   Bacteria, UA FEW (A) NONE SEEN   Squamous Epithelial / LPF 11-20 0 - 5   Mucus PRESENT    Hyaline Casts, UA PRESENT     Comment: Performed at [facility], [name] [address], [place], [place] [address]  Glucose, capillary     Status: Abnormal   Collection Time: 09/06/20  4:56 PM  Result Value Ref Range   Glucose-Capillary 150 (H) 70 - 99 mg/dL    Comment: Glucose reference range applies only to samples taken after fasting for at least 8 hours.  Glucose, capillary     Status: Abnormal   Collection Time: 09/06/20  9:36 PM  Result Value Ref Range   Glucose-Capillary 200 (H) 70 - 99 mg/dL    Comment: Glucose reference range applies only to samples taken after fasting for at least 8 hours.  CBC     Status: Abnormal   Collection Time: 09/07/20  3:33 AM  Result Value Ref Range   WBC 14.3 (H) 4.0 - 10.5 K/uL   RBC 4.64 3.87 - 5.11 MIL/uL   Hemoglobin 13.3 12.0 - 15.0 g/dL   HCT 42.8 36.0 - 46.0 %   MCV 92.2 80.0 - 100.0 fL   MCH  28.7 26.0 - 34.0 pg   MCHC 31.1 30.0 - 36.0 g/dL   RDW 16.1 (H) 11.5 - 15.5 %   Platelets 195 150 - 400 K/uL   nRBC 0.0 0.0 - 0.2 %    Comment: Performed at [facility], [place] [address], [place], [place] [address]  Basic metabolic panel     Status: Abnormal   Collection Time: 09/07/20  3:33 AM  Result Value Ref Range   Sodium 135 135 - 145 mmol/L   Potassium 3.9 3.5 - 5.1 mmol/L   Chloride 97 (L) 98 - 111 mmol/L   CO2 30 22 - 32 mmol/L   Glucose, Bld 174 (H) 70 - 99 mg/dL    Comment: Glucose reference range applies only to samples taken after fasting for at least 8 hours.   BUN 31 (H) 8 - 23 mg/dL   Creatinine, Ser 1.21 (H) 0.44 - 1.00 mg/dL   Calcium 9.4 8.9 - 10.3 mg/dL   GFR, Estimated 42 (L) >60 mL/min    Comment: (NOTE) Calculated using the CKD-EPI Creatinine Equation (2021)    Anion gap 8 5 - 15    Comment: Performed at [facility] [address] [place], [place] [address]  Magnesium     Status: None   Collection Time: 09/07/20  3:33 AM  Result Value Ref Range   Magnesium 1.7 1.7 - 2.4 mg/dL    Comment: Performed at [facility] [address] [place], [place] [address]  Glucose, capillary     Status: Abnormal   Collection Time: 09/07/20  6:49 AM  Result Value Ref Range   Glucose-Capillary 179 (H) 70 - 99 mg/dL    Comment: Glucose reference range applies only to samples taken after fasting for at least 8 hours.   *Note: Due to a large number of results and/or encounters for the requested time period, some results have not been displayed. A complete set of results can be found in Results Review.    DG Pelvis 1-2 Views  Result Date: 09/05/2020 CLINICAL DATA:  Fall, RIGHT hip pain. EXAM: PELVIS - 1-2  VIEW COMPARISON:  None. FINDINGS: Displaced fracture of the RIGHT femoral neck, possibly comminuted, with associated angulation deformity. Questionable fracture within the LEFT superior pubic ramus, but this is more likely artifactual related to patient  positioning and overlying bowel loop. Remainder of the osseous structures of the pelvis appear intact and normally aligned. Extensive vascular calcifications. IMPRESSION: 1. Displaced fracture of the RIGHT femoral neck, possibly comminuted, with associated angulation deformity. 2. Questionable fracture within the LEFT superior pubic ramus, but this is more likely artifactual related to patient positioning and overlying bowel loop. Consider CT of the pelvis for further characterization. Electronically Signed   By: Franki Cabot M.D.   On: 09/05/2020 19:35   DG CHEST PORT 1 VIEW  Result Date: 09/06/2020 CLINICAL DATA:  Cardiac palpitations and chest pain EXAM: PORTABLE CHEST 1 VIEW COMPARISON:  Sep 02, 2019 FINDINGS: No edema or airspace opacity. Heart is enlarged with pulmonary vascularity within normal limits. No adenopathy. Patient is status post coronary artery bypass grafting. There is aortic atherosclerosis. There is degenerative change in the right shoulder with superior migration of the right humeral head. Calcification noted in right carotid artery. IMPRESSION: Stable cardiac prominence with postoperative change. No edema or airspace opacity. Aortic Atherosclerosis (ICD10-I70.0). Calcification also noted in right carotid artery. Apparent chronic rotator cuff tear on the right. Electronically Signed   By: Lowella Grip III M.D.   On: 09/06/2020 08:22   DG FEMUR, MIN 2 VIEWS RIGHT  Result Date: 09/05/2020 CLINICAL DATA:  Fall, RIGHT hip pain. EXAM: RIGHT FEMUR 2 VIEWS COMPARISON:  None. FINDINGS: Displaced/comminuted fracture of the RIGHT femoral neck, subcapital to intratrochanteric region, with angulation deformity at the fracture site. Femoral head remains grossly well aligned with the acetabulum. Distal femur appears intact and normally aligned. Extensive vascular calcifications. Vascular stent in place from the level of the upper thigh to the knee. IMPRESSION: Displaced/comminuted fracture of the  RIGHT femoral neck, subcapital to intratrochanteric region, with associated angulation deformity. Electronically Signed   By: Franki Cabot M.D.   On: 09/05/2020 19:31    Review of Systems  Unable to perform ROS: Other  Musculoskeletal: Positive for arthralgias (Right hip).   Blood pressure 112/76, pulse (!) 104, temperature 100 F (37.8 C), temperature source Oral, resp. rate 14, height 4\' 8"  (1.422 m), weight 62.3 kg, SpO2 93 %. Physical Exam Constitutional:      General: She is not in acute distress.    Appearance: She is well-developed. She is not diaphoretic.  HENT:     Head: Normocephalic and atraumatic.  Eyes:     General: No scleral icterus.       Right eye: No discharge.        Left eye: No discharge.     Conjunctiva/sclera: Conjunctivae normal.  Cardiovascular:     Rate and Rhythm: Normal rate and regular rhythm.  Pulmonary:     Effort: Pulmonary effort is normal. No respiratory distress.  Musculoskeletal:     Cervical back: Normal range of motion.     Comments: RLE No traumatic wounds, ecchymosis, or rash  Mild TTP hip  No knee or ankle effusion  Knee stable to varus/ valgus and anterior/posterior stress  Sens DPN, SPN, TN grossly intact  Motor EHL, ext, flex, evers 5/5  DP 2+, PT 1+, No significant edema  Skin:    General: Skin is warm and dry.  Neurological:     Mental Status: She is alert.  Psychiatric:        Behavior:  Behavior normal.     Assessment/Plan: Right hip fx -- Plan IMN later today by Dr. Erlinda Hong. Please keep NPO. Multiple medical problems including paroxsmal afib on eliquis, h/o provoked DVT, T2DM, HLD, HFpEF (EF 50-55%), depression/anxiety, GERD, CAD, CKD III, h/o skin cancer, and chronic pain 2/2 spinal stenosis -- per primary service. Should be ok to restart Eliquis tomorrow.    Lisette Abu, PA-C Orthopedic Surgery [phone] 09/07/2020, 9:18 AM

## 2020-09-07 NOTE — Hospital Course (Addendum)
[name] is a 87 y.o. female presenting with right hip fracture after mechanical fall at home. PMH is significant for paroxsmal afib on eliquis, h/o provoked DVT, T2DM, HLD, HFpEF (EF 50-55%), depression/anxiety, GERD, CAD, CKD III, h/o skin cancer, chronic pain 2/2 spinal stenosis  Right femoral neck fracture s/p ORIF Patient admitted with right femoral neck fracture after mechanical fall. Orthopedics was consulted and patient underwent ORIF with intramedullary nail on 09/07/20.  Eliquis was initially held for surgery, and was resumed on 6/7.  PT/OT saw patient afterward and recommended SNF placement.    Paroxysmal atrial fibrillation Patient's Eliquis was held on admission due to need for surgery, was restarted on 6/7.  Home medications of metoprolol 50 mg twice daily and diltiazem 120 mg daily were continued.  Abnormal UA with concern for UTI Patient had abnormal urinalysis and elevated WBC, patient afebrile during hospitalization. Urine culture was not able to be collected before patient was given antibiotics for surgical prophylaxis and patient received 3 doses of cephazolin total followed by Keflex to finish out 5 days of treatment due to clinical concern for UTI.  WBC normalized.   Type 2 DM Patient home medications of Januvia and Pioglitazone were held and she was placed on sliding scale insulin while hospitalized.  Hypoxia Patient placed on O2 on admission.  This thought to be new requirement.  No changes on Chest X-Ray. Was weaned but desatted to 88%.  Patient did not have chest pain or difficulty brerathing.  Obtained CTA which was negative for PE.  Determined new chronic requirement.  Remained stable on 2L at discharge.  Can have ECHO outpatient.   All other chronic conditions were stable during hospitalization.

## 2020-09-08 ENCOUNTER — Encounter (HOSPITAL_COMMUNITY): Payer: Self-pay | Admitting: Orthopaedic Surgery

## 2020-09-08 LAB — BASIC METABOLIC PANEL
Anion gap: 11 (ref 5–15)
BUN: 32 mg/dL — ABNORMAL HIGH (ref 8–23)
CO2: 27 mmol/L (ref 22–32)
Calcium: 9.2 mg/dL (ref 8.9–10.3)
Chloride: 98 mmol/L (ref 98–111)
Creatinine, Ser: 1.16 mg/dL — ABNORMAL HIGH (ref 0.44–1.00)
GFR, Estimated: 45 mL/min — ABNORMAL LOW (ref >60)
Glucose, Bld: 233 mg/dL — ABNORMAL HIGH (ref 70–99)
Potassium: 4.3 mmol/L (ref 3.5–5.1)
Sodium: 136 mmol/L (ref 135–145)

## 2020-09-08 LAB — CBC
HCT: 43.5 % (ref 36.0–46.0)
Hemoglobin: 13.5 g/dL (ref 12.0–15.0)
MCH: 28.8 pg (ref 26.0–34.0)
MCHC: 31 g/dL (ref 30.0–36.0)
MCV: 92.9 fL (ref 80.0–100.0)
Platelets: 144 10*3/uL — ABNORMAL LOW (ref 150–400)
RBC: 4.68 MIL/uL (ref 3.87–5.11)
RDW: 16.1 % — ABNORMAL HIGH (ref 11.5–15.5)
WBC: 12.1 10*3/uL — ABNORMAL HIGH (ref 4.0–10.5)
nRBC: 0 % (ref 0.0–0.2)

## 2020-09-08 LAB — GLUCOSE, CAPILLARY
Glucose-Capillary: 191 mg/dL — ABNORMAL HIGH (ref 70–99)
Glucose-Capillary: 167 mg/dL — ABNORMAL HIGH (ref 70–99)
Glucose-Capillary: 242 mg/dL — ABNORMAL HIGH (ref 70–99)

## 2020-09-08 MED ORDER — HYDROCODONE-ACETAMINOPHEN 5-325 MG PO TABS: 1.0000 | ORAL_TABLET | Freq: Four times a day (QID) | ORAL | 0 refills | Status: DC | PRN

## 2020-09-08 MED ORDER — APIXABAN 5 MG PO TABS
5.0000 mg | ORAL_TABLET | Freq: Two times a day (BID) | ORAL | Status: DC
  Administered 2020-09-08 – 2020-09-14 (×13): 5 mg via ORAL
  Filled 2020-09-08 (×14): qty 1

## 2020-09-08 MED ORDER — CEPHALEXIN 500 MG PO CAPS
500.0000 mg | ORAL_CAPSULE | Freq: Three times a day (TID) | ORAL | Status: AC
  Administered 2020-09-08 – 2020-09-12 (×12): 500 mg via ORAL
  Filled 2020-09-08 (×12): qty 1

## 2020-09-08 NOTE — NC FL2 (Signed)
[place] MEDICAID FL2 LEVEL OF CARE SCREENING TOOL     IDENTIFICATION  Patient Name: [name] Birthdate: Jan 21, 1928 Sex: female Admission Date (Current Location): 09/05/2020  [facility] and [place] Number:  Herbalist and Address:  [facility] [place] [place] [address], [place], [place] [address]      Provider Number: [id number]  Attending Physician Name and Address:  Leeanne Rio, MD  Relative Name and Phone Number:  [name] (Granddaughter)   [phone]    Current Level of Care: Hospital Recommended Level of Care: [place] Prior Approval Number:    Date Approved/Denied:   PASRR Number: [id number]  Discharge Plan: SNF    Current Diagnoses: Patient Active Problem List   Diagnosis Date Noted  . Displaced intertrochanteric fracture of right femur, initial encounter for closed fracture ([place]) 09/05/2020  . Financial difficulty 02/26/2020  . UTI (urinary tract infection) 10/01/2019  . CKD stage 3 due to type 2 diabetes mellitus ([place]) 09/02/2019  . History of CVA in adulthood 08/23/2019  . Lethargy 07/27/2019  . Polypharmacy 07/27/2019  . Pneumonia 02/15/2019  . E coli bacteremia 02/13/2019  . Dyspnea   . Diverticulitis of duodenum   . Duodenal ulcer perforation ([place]) 02/08/2019  . Chronic leg pain 11/14/2018  . Atherosclerosis of native arteries of the extremities with ulceration ([place]) 08/21/2018  . Chronic constipation 06/18/2018  . Erythema of breast 04/18/2018  . MRSA cellulitis 03/31/2018  . Advanced directives, counseling/discussion 01/29/2018  . Bilateral hearing loss 01/29/2018  . Spinal stenosis of lumbosacral region 01/08/2018  . Systolic murmur 24/58/0998  . De Quervain's tenosynovitis, left 01/09/2017  . Encounter for chronic pain management 02/09/2016  . Paroxysmal atrial fibrillation (HCC)   . Varicose veins of left lower extremity with inflammation, with ulcer of calf limited to breakdown of  skin ([place]) 05/22/2015  . Varicose veins of right lower extremity with inflammation 01/23/2015  . Diabetes mellitus type 2 with peripheral artery disease ([place])   . Chronic venous insufficiency   . Bleeding from varicose veins of lower extremity, left 02/13/2014  . Diabetic polyneuropathy ([place]) 11/19/2013  . Type 2 diabetes mellitus with neurological manifestations, controlled ([place]) 11/19/2013  . Right shoulder pain 08/19/2013  . Cerumen impaction 04/16/2013  . Abdominal pain 05/14/2012  . Peptic ulcer disease   . Unsteady gait 12/13/2011  . Chronic lower back pain 09/14/2011  . Hyperlipidemia associated with type 2 diabetes mellitus ([place]) 09/06/2010  . GAD (generalized anxiety disorder) 03/15/2010  . Obesity, Class I, BMI 30-34.9 03/15/2010  . Hypertension 03/15/2010  . (HFpEF) heart failure with preserved ejection fraction ([place]) 03/15/2010  . Coronary artery disease     Orientation RESPIRATION BLADDER Height & Weight     Self,Place  Other (Comment) (nasal cannula) External catheter Weight: 137 lb 5.6 oz (62.3 kg) Height:  4\' 8"  (142.2 cm)  BEHAVIORAL SYMPTOMS/MOOD NEUROLOGICAL BOWEL NUTRITION STATUS      Continent Diet (see d/c summary)  AMBULATORY STATUS COMMUNICATION OF NEEDS Skin   Extensive Assist Verbally Surgical wounds                       Personal Care Assistance Level of Assistance  Bathing,Feeding,Dressing Bathing Assistance: Limited assistance Feeding assistance: Limited assistance Dressing Assistance: Maximum assistance     Functional Limitations Info  Sight,Hearing,Speech Sight Info: Adequate Hearing Info: Impaired Speech Info: Adequate    SPECIAL CARE FACTORS FREQUENCY  PT (By licensed PT),OT (By licensed OT)  PT Frequency: 5x/ week OT Frequency: 5x/ week            Contractures Contractures Info: Not present    Additional Factors Info  Insulin Sliding Scale,Allergies,Code Status Code Status Info: Full Allergies Info: Metformin and  Related   Insulin Sliding Scale Info: see d/c medication list       Current Medications (09/08/2020):  This is the current hospital active medication list Current Facility-Administered Medications  Medication Dose Route Frequency Provider Last Rate Last Admin  . 0.9 %  sodium chloride infusion   Intravenous Continuous Leandrew Koyanagi, MD 75 mL/hr at 09/08/20 0559 New Bag at 09/08/20 0559  . acetaminophen (TYLENOL) tablet 1,000 mg  1,000 mg Oral Q6H Leandrew Koyanagi, MD   1,000 mg at 09/08/20 0559  . acetaminophen (TYLENOL) tablet 325-650 mg  325-650 mg Oral Q6H PRN Leandrew Koyanagi, MD      . ALPRAZolam Duanne Moron) tablet 0.5 mg  0.5 mg Oral QHS Leandrew Koyanagi, MD   0.5 mg at 09/07/20 2139  . alum & mag hydroxide-simeth (MAALOX/MYLANTA) 200-200-20 MG/5ML suspension 30 mL  30 mL Oral Q4H PRN Leandrew Koyanagi, MD   30 mL at 09/08/20 1150  . apixaban (ELIQUIS) tablet 5 mg  5 mg Oral BID Lilland, Alana, DO   5 mg at 09/08/20 0903  . cephALEXin (KEFLEX) capsule 500 mg  500 mg Oral Q8H Lilland, Alana, DO      . diltiazem (CARDIZEM CD) 24 hr capsule 120 mg  120 mg Oral Daily Leandrew Koyanagi, MD   120 mg at 09/08/20 0858  . docusate sodium (COLACE) capsule 100 mg  100 mg Oral BID Leandrew Koyanagi, MD   100 mg at 09/08/20 0858  . fentaNYL (SUBLIMAZE) injection 25 mcg  25 mcg Intravenous Q2H PRN Leandrew Koyanagi, MD   25 mcg at 09/07/20 1854  . fluticasone (FLONASE) 50 MCG/ACT nasal spray 1 spray  1 spray Each Nare Daily PRN Leandrew Koyanagi, MD      . furosemide (LASIX) tablet 40 mg  40 mg Oral BID Leandrew Koyanagi, MD   40 mg at 09/08/20 0858  . HYDROcodone-acetaminophen (NORCO/VICODIN) 5-325 MG per tablet 1 tablet  1 tablet Oral Q6H PRN Leandrew Koyanagi, MD   1 tablet at 09/08/20 1149  . HYDROmorphone (DILAUDID) injection 0.5-1 mg  0.5-1 mg Intravenous Q4H PRN Leandrew Koyanagi, MD      . insulin aspart (novoLOG) injection 0-6 Units  0-6 Units Subcutaneous TID WC Leandrew Koyanagi, MD   1 Units at 09/08/20 1148  . loratadine (CLARITIN)  tablet 10 mg  10 mg Oral Daily PRN Leandrew Koyanagi, MD      . magnesium citrate solution 1 Bottle  1 Bottle Oral Once PRN Leandrew Koyanagi, MD      . menthol-cetylpyridinium (CEPACOL) lozenge 3 mg  1 lozenge Oral PRN Leandrew Koyanagi, MD       Or  . phenol (CHLORASEPTIC) mouth spray 1 spray  1 spray Mouth/Throat PRN Leandrew Koyanagi, MD      . methocarbamol (ROBAXIN) tablet 500 mg  500 mg Oral Q6H PRN Leandrew Koyanagi, MD   500 mg at 09/08/20 0858   Or  . methocarbamol (ROBAXIN) 500 mg in dextrose 5 % 50 mL IVPB  500 mg Intravenous Q6H PRN Leandrew Koyanagi, MD      . metoprolol tartrate (LOPRESSOR) tablet 50 mg  50 mg Oral BID Erlinda Hong, Naiping  M, MD   50 mg at 09/08/20 0858  . ondansetron (ZOFRAN) tablet 4 mg  4 mg Oral Q6H PRN Leandrew Koyanagi, MD       Or  . ondansetron [facility]) injection 4 mg  4 mg Intravenous Q6H PRN Leandrew Koyanagi, MD      . oxyCODONE (Oxy IR/ROXICODONE) immediate release tablet 10-15 mg  10-15 mg Oral Q4H PRN Leandrew Koyanagi, MD      . oxyCODONE (Oxy IR/ROXICODONE) immediate release tablet 5-10 mg  5-10 mg Oral Q4H PRN Leandrew Koyanagi, MD      . pantoprazole (PROTONIX) EC tablet 40 mg  40 mg Oral BID Leandrew Koyanagi, MD   40 mg at 09/08/20 0857  . polyethylene glycol (MIRALAX / GLYCOLAX) packet 17 g  17 g Oral Daily PRN Leandrew Koyanagi, MD      . pravastatin (PRAVACHOL) tablet 20 mg  20 mg Oral Daily Leandrew Koyanagi, MD   20 mg at 09/08/20 0858  . senna (SENOKOT) tablet 8.6 mg  1 tablet Oral BID Leandrew Koyanagi, MD   8.6 mg at 09/08/20 0858  . sertraline (ZOLOFT) tablet 50 mg  50 mg Oral Daily Leandrew Koyanagi, MD   50 mg at 09/08/20 0858  . sorbitol 70 % solution 30 mL  30 mL Oral Daily PRN Leandrew Koyanagi, MD         Discharge Medications: Please see discharge summary for a list of discharge medications.  Relevant Imaging Results:  Relevant Lab Results:   Additional Information SSN [id number];  Alphonsa Overall COVID-19 VACCINE 06/11/2019,  PFIZER Comrnaty([place] TOP) Covid-19 Vaccine 09/05/2020  Paulene Floor Arlys Scatena,  LCSWA

## 2020-09-08 NOTE — Progress Notes (Signed)
Occupational Therapy Evaluation Patient Details Name: [name] MRN: [id number] DOB: 1927/06/07 Today's Date: 09/08/2020    History of Present Illness [name] is a 87 y.o. female presenting with right hip fracture after mechanical fall at home. PMH is significant for paroxsmal afib on eliquis, h/o provoked DVT, T2DM, HLD, HFpEF (EF 50-55%), depression/anxiety, GERD, CAD, CKD III, h/o skin cancer, chronic pain 2/2 spinal stenosis   Clinical Impression   [name] was evaluated s/p the above hip fracture. PTA was was mod I with ADLs, bathed at the sink and used rallator some of the time for ambulation; she lives in a 1 level home with 7 STE, and has two people who live with her all of the time however they are both in bad health and cannot provide assistance. Upon evaluation pt was bed level due to 10/10 pain to move R hip. She is mod/max for dressing and total for toileting at this time. Pt expressed anxiety and depression due to a "toxic" family environment, and does not have anyone who can provide 24/7 care for her. She also stated that she had a horrible experience at a SNF and has no desire returning back to a facility. Pt would benefit from continued acute OT services to progress function and mobility in ADLs. Due to pt's barriers to discharge, she would benefit from SNF level rehab prior to returning home.     Follow Up Recommendations  SNF;Supervision/Assistance - 24 hour    Equipment Recommendations  Other (comment) (defer to next venue)       Precautions / Restrictions Precautions Precautions: Fall Restrictions Weight Bearing Restrictions: Yes RLE Weight Bearing: Weight bearing as tolerated      Mobility Bed Mobility Overal bed mobility: Needs Assistance Bed Mobility: Supine to Sit     Supine to sit: Max assist;HOB elevated     General bed mobility comments: pt unable to fully sit EOB due to pain    Transfers Overall transfer level: Needs assistance                General transfer comment: defer this session for pain management    Balance Overall balance assessment: History of Falls (Pt reported multiple falls in the past 2 years)           ADL either performed or assessed with clinical judgement   ADL Overall ADL's : Needs assistance/impaired Eating/Feeding: Independent;Bed level   Grooming: Wash/dry hands;Wash/dry face;Oral care;Applying deodorant;Brushing hair;Set up;Bed level   Upper Body Bathing: Minimal assistance;Bed level   Lower Body Bathing: Maximal assistance;Bed level   Upper Body Dressing : Moderate assistance;Bed level   Lower Body Dressing: Maximal assistance;Bed level   Toilet Transfer: Total assistance   Toileting- Clothing Manipulation and Hygiene: Total assistance       Functional mobility during ADLs: Total assistance;+2 for physical assistance;+2 for safety/equipment General ADL Comments: pt limited to bed level this session due to pain                  Pertinent Vitals/Pain Pain Assessment: Faces Faces Pain Scale: Hurts worst Pain Location: R leg Pain Descriptors / Indicators: Moaning;Crying;Discomfort;Grimacing;Guarding Pain Intervention(s): Limited activity within patient's tolerance;Monitored during session     Hand Dominance Right   Extremity/Trunk Assessment Upper Extremity Assessment Upper Extremity Assessment: RUE deficits/detail RUE Deficits / Details: pt reports she fell on her RUE a few years ago and lost function; limited to about 90* flexion & abduction of shoulder RUE Sensation: WNL RUE Coordination: decreased fine  motor   Lower Extremity Assessment Lower Extremity Assessment: Defer to PT evaluation (Pt reports she has not been able to lift her R leg very high for several years - this is the reason she stopped getting in/out of the shower)   Cervical / Trunk Assessment Cervical / Trunk Assessment: Normal   Communication Communication Communication: HOH   Cognition  Arousal/Alertness: Awake/alert Behavior During Therapy: WFL for tasks assessed/performed Overall Cognitive Status: No family/caregiver present to determine baseline cognitive functioning        General Comments: pt is extremely HOH, cognititon seems to be intact - pt admits to depression and anxiety and states that she has a toxic family environment and no support at home since her son passed 4 years ago   General Comments  sx dressing dry and clean, pt with hearing aides however still extremely [place] expects to be discharged to:: Private residence Living Arrangements: Non-relatives/Friends Available Help at Discharge: Available PRN/intermittently (grand children live near by, cannot provide 24/7 support) Type of Home: House Home Access: Stairs to enter Technical brewer of Steps: 7 Entrance Stairs-Rails: Right;Left Home Layout: One level     Bathroom Shower/Tub: Tub/shower unit (pt does not get into shower)   Bathroom Toilet: Handicapped height (seems like BSC placed over toilet)     Home Equipment: Bedside commode;Walker - 4 wheels;Grab bars - toilet (grab bars down the hallway from living room to bathroom)   Additional Comments: [name] and Scrubby live with pt; neither of which can physically assist pt      Prior Functioning/Environment Level of Independence: Independent with assistive device(s)                 OT Problem List: Decreased strength;Decreased range of motion;Decreased activity tolerance;Impaired balance (sitting and/or standing);Decreased safety awareness;Decreased knowledge of use of DME or AE;Decreased knowledge of precautions;Pain      OT Treatment/Interventions: Self-care/ADL training;Therapeutic exercise;DME and/or AE instruction;Therapeutic activities;Patient/family education;Balance training    OT Goals(Current goals can be found in the care plan section) Acute Rehab OT Goals Patient Stated Goal: home OT Goal  Formulation: With patient Time For Goal Achievement: 09/22/20 Potential to Achieve Goals: Fair ADL Goals Pt Will Perform Grooming: with supervision;sitting Pt Will Perform Lower Body Dressing: with min assist;sit to/from stand Pt Will Transfer to Toilet: stand pivot transfer;with min guard assist;bedside commode Pt Will Perform Toileting - Clothing Manipulation and hygiene: with supervision;sitting/lateral leans  OT Frequency: Min 2X/week   Barriers to D/C: Decreased caregiver support;Inaccessible home environment  pt lives with two older gentleman who are not in great health, no reliable family to give 24/7 care, she has STE          AM-PAC OT "6 Clicks" Daily Activity     Outcome Measure Help from another person eating meals?: None Help from another person taking care of personal grooming?: A [place] Help from another person toileting, which includes using toliet, bedpan, or urinal?: Total Help from another person bathing (including washing, rinsing, drying)?: A Lot Help from another person to put on and taking off regular upper body clothing?: A Lot Help from another person to put on and taking off regular lower body clothing?: A Lot 6 Click Score: 14   End of Session Nurse Communication: Mobility status;Other (comment) (Pain management)  Activity Tolerance: Patient limited by pain Patient left: in bed;with call bell/phone within reach;with bed alarm set  OT Visit Diagnosis: Other abnormalities of gait and mobility (R26.89);History of falling (  Z91.81);Muscle weakness (generalized) (M62.81);Pain Pain - Right/Left: Right Pain - part of body: Leg                Time: 1000-1022 OT Time Calculation (min): 22 min Charges:  OT General Charges $OT Visit: 1 Visit OT Evaluation $OT Eval Moderate Complexity: 1 Mod    Judieth Mckown A Novalyn Lajara 09/08/2020, 12:27 PM

## 2020-09-08 NOTE — Plan of Care (Signed)
°  Problem: Activity: °Goal: Ability to avoid complications of mobility impairment will improve °Outcome: Progressing °Goal: Ability to tolerate increased activity will improve °Outcome: Progressing °  °Problem: Education: °Goal: Verbalization of understanding the information provided will improve °Outcome: Progressing °  °

## 2020-09-08 NOTE — TOC Initial Note (Signed)
Transition of Care [facility]) - Initial/Assessment Note    Patient Details  Name: [name] MRN: [id number] Date of Birth: 12-14-1927  Transition of [facility] [place]) CM/SW Contact:    Milinda Antis, [place] Phone Number: 09/08/2020, 4:57 PM  Clinical Narrative:                 CSW received consult for possible SNF placement at time of discharge. CSW spoke with patient's grand daughter [name], due to the patient being disoriented.   CSW was informed that the patient lives with her significant other and a roommate all of which are elderly and unable to provide the care needed.  Susie also stated that the family is unable to provide the level of assistance needed by the patient.    Susie gave permission for CSW to fax out the [facility] to SNF agencies in [place] and [place].  The family will continue to attempt to encourage the patient to go to rehab at a SNF.  No further questions reported at this time.   Expected Discharge Plan: Skilled Nursing Facility Barriers to Discharge: Continued Medical Work up   Patient Goals and CMS Choice   CMS Medicare.gov Compare Post Acute Care list provided to:: Patient Represenative (must comment) Choice offered to / list presented to : Adult Children  Expected Discharge Plan and Services Expected Discharge Plan: [place] arrangements for the past 2 months: Single Family Home                                      Prior Living Arrangements/Services Living arrangements for the past 2 months: Single Family Home Lives with:: Significant Other,Roommate Patient language and need for interpreter reviewed:: Yes Do you feel safe going back to the place where you live?: Yes      Need for Family Participation in Patient Care: Yes (Comment) Care giver support system in place?: Yes (comment)   Criminal Activity/Legal Involvement Pertinent to Current Situation/Hospitalization: No - Comment as needed  Activities of Daily  Living Home Assistive Devices/Equipment: Walker (specify type) ADL Screening (condition at time of admission) Patient's cognitive ability adequate to safely complete daily activities?: Yes Is the patient deaf or have difficulty hearing?: Yes Does the patient have difficulty seeing, even when wearing glasses/contacts?: No Does the patient have difficulty concentrating, remembering, or making decisions?: No Patient able to express need for assistance with ADLs?: Yes Does the patient have difficulty dressing or bathing?: No Independently performs ADLs?: Yes (appropriate for developmental age) Does the patient have difficulty walking or climbing stairs?: Yes Weakness of Legs: Both Weakness of Arms/Hands: None  Permission Sought/Granted                  Emotional Assessment       Orientation: : Oriented to Self,Oriented to Place Alcohol / Substance Use: Not Applicable Psych Involvement: No (comment)  Admission diagnosis:  Hip fracture ([place]) [S72.009A] Pain [R52] Right hip pain [M25.551] Patient Active Problem List   Diagnosis Date Noted  . Displaced intertrochanteric fracture of right femur, initial encounter for closed fracture ([place]) 09/05/2020  . Financial difficulty 02/26/2020  . UTI (urinary tract infection) 10/01/2019  . CKD stage 3 due to type 2 diabetes mellitus ([place]) 09/02/2019  . History of CVA in adulthood 08/23/2019  . Lethargy 07/27/2019  . Polypharmacy 07/27/2019  . Pneumonia 02/15/2019  . E  coli bacteremia 02/13/2019  . Dyspnea   . Diverticulitis of duodenum   . Duodenal ulcer perforation ([place]) 02/08/2019  . Chronic leg pain 11/14/2018  . Atherosclerosis of native arteries of the extremities with ulceration ([place]) 08/21/2018  . Chronic constipation 06/18/2018  . Erythema of breast 04/18/2018  . MRSA cellulitis 03/31/2018  . Advanced directives, counseling/discussion 01/29/2018  . Bilateral hearing loss 01/29/2018  . Spinal stenosis of lumbosacral region  01/08/2018  . Systolic murmur 19/37/9024  . De Quervain's tenosynovitis, left 01/09/2017  . Encounter for chronic pain management 02/09/2016  . Paroxysmal atrial fibrillation (HCC)   . Varicose veins of left lower extremity with inflammation, with ulcer of calf limited to breakdown of skin ([place]) 05/22/2015  . Varicose veins of right lower extremity with inflammation 01/23/2015  . Diabetes mellitus type 2 with peripheral artery disease ([place])   . Chronic venous insufficiency   . Bleeding from varicose veins of lower extremity, left 02/13/2014  . Diabetic polyneuropathy ([place]) 11/19/2013  . Type 2 diabetes mellitus with neurological manifestations, controlled ([place]) 11/19/2013  . Right shoulder pain 08/19/2013  . Cerumen impaction 04/16/2013  . Abdominal pain 05/14/2012  . Peptic ulcer disease   . Unsteady gait 12/13/2011  . Chronic lower back pain 09/14/2011  . Hyperlipidemia associated with type 2 diabetes mellitus ([place]) 09/06/2010  . GAD (generalized anxiety disorder) 03/15/2010  . Obesity, Class I, BMI 30-34.9 03/15/2010  . Hypertension 03/15/2010  . (HFpEF) heart failure with preserved ejection fraction ([place]) 03/15/2010  . Coronary artery disease    PCP:  Ria Bush, MD Pharmacy:   [id number] [place], [place] [address] Phone: [phone] Fax: [phone]  [facility] [place], [place] - [address] [place] [address] Phone: [phone] Fax: [place], [place] [address] [place] [address] Phone: [phone] Fax: [phone]     Social Determinants of Health ([place]) Interventions    Readmission Risk Interventions Readmission Risk Prevention Plan 09/04/2019  Transportation Screening Complete  PCP or Specialist Appt within 3-5 Days Complete  HRI or Home Care Consult Complete   Palliative Care Screening Not Applicable  Medication Review (RN Care Manager) Complete  Some recent data might be hidden

## 2020-09-08 NOTE — Progress Notes (Signed)
Subjective: 1 Day Post-Op Procedure(s) (LRB): INTRAMEDULLARY (IM) NAIL INTERTROCHANTRIC (Right) Patient reports pain as mild.    Objective: Vital signs in last 24 hours: Temp:  [97.5 F (36.4 C)-98.8 F (37.1 C)] 98.5 F (36.9 C) (06/07 0334) Pulse Rate:  [64-127] 78 (06/07 0334) Resp:  [16-23] 18 (06/07 0334) BP: (95-117)/(43-77) 107/53 (06/07 0334) SpO2:  [91 %-100 %] 99 % (06/07 0334) Weight:  [62.3 kg] 62.3 kg (06/06 1348)  Intake/Output from previous day: 06/06 0701 - 06/07 0700 In: 1400 [P.O.:100; I.V.:1200; IV Piggyback:100] Out: 50 [Blood:50] Intake/Output this shift: No intake/output data recorded.  Recent Labs    09/05/20 1950 09/06/20 0233 09/07/20 0333 09/08/20 0248  HGB 13.4 12.8 13.3 13.5   Recent Labs    09/07/20 0333 09/08/20 0248  WBC 14.3* 12.1*  RBC 4.64 4.68  HCT 42.8 43.5  PLT 195 144*   Recent Labs    09/07/20 0333 09/08/20 0248  NA 135 136  K 3.9 4.3  CL 97* 98  CO2 30 27  BUN 31* 32*  CREATININE 1.21* 1.16*  GLUCOSE 174* 233*  CALCIUM 9.4 9.2   No results for input(s): LABPT, INR in the last 72 hours.  Neurovascular intact Sensation intact distally Intact pulses distally Dorsiflexion/Plantar flexion intact Incision: dressing C/D/I No cellulitis present Compartment soft   Assessment/Plan: 1 Day Post-Op Procedure(s) (LRB): INTRAMEDULLARY (IM) NAIL INTERTROCHANTRIC (Right) Up with therapy  WBAT RLE Ok to continue Eliquis F/u with Dr. Erlinda Hong two weeks post-op      Aundra Dubin 09/08/2020, 8:33 AM

## 2020-09-08 NOTE — Evaluation (Signed)
Physical Therapy Evaluation Patient Details Name: [name] MRN: [id number] DOB: 1927/05/20 Today's Date: 09/08/2020   History of Present Illness  [name] is a 87 y.o. female presenting with right hip fracture after mechanical fall at home, s/p IM nail on 6/6. PMH is significant for paroxsmal afib on eliquis, h/o provoked DVT, T2DM, HLD, HFpEF (EF 50-55%), depression/anxiety, GERD, CAD, CKD III, h/o skin cancer, chronic pain 2/2 spinal stenosis    Clinical Impression  Pt admitted with above. Pt under better pain management this afternoon compared to OT session. Pt requiring maxA for transfers and is unable to tolerate standing at this time. Began to discuss the need for rehab prior to return home. Pt adamantly refused to go to "nursing home" stating she is going home. Pt however has had multiple falls and requires maxA in which her roommates cannot provide. Pt's grandson report his family is aware and they will work on trying to "covince" her. Acute PT to cont to follow.    Follow Up Recommendations SNF;Supervision/Assistance - 24 hour    Equipment Recommendations  Rolling walker with 5" wheels    Recommendations for Other Services       Precautions / Restrictions Precautions Precautions: Fall Restrictions Weight Bearing Restrictions: Yes RLE Weight Bearing: Weight bearing as tolerated      Mobility  Bed Mobility Overal bed mobility: Needs Assistance Bed Mobility: Supine to Sit;Sit to Supine     Supine to sit: Max assist;HOB elevated Sit to supine: Max assist;+2 for physical assistance   General bed mobility comments: with max directional verbal cues pt able to bring LE to EOB with modA, maxA for trunk elevation from elevated HOB, minimal pain reported by pt, c/o "I'm so dizzy"    Transfers Overall transfer level: Needs assistance Equipment used: Rolling walker (2 wheeled) Transfers: Sit to/from Stand Sit to Stand: Max assist         General transfer  comment: attempted to stand, maxA for powering up, pt wanting someone to pull her up stating "that what they do at home", unable to achieve full upright standing, pt with significant retropulsion, screaming stating "I'm going to fall" pt returned to supine in bed  Ambulation/Gait             General Gait Details: unable at this time  Stairs            Wheelchair Mobility    Modified Rankin (Stroke Patients Only)       Balance Overall balance assessment: History of Falls (Pt reported 7 falls in the past 5 years)                                           Pertinent Vitals/Pain Pain Assessment: Faces Faces Pain Scale: Hurts [place] more Pain Location: R leg with mobility Pain Descriptors / Indicators: Moaning;Discomfort Pain Intervention(s): Monitored during session    Home Living Family/patient expects to be discharged to:: Private residence Living Arrangements: Non-relatives/Friends Available Help at Discharge: Available PRN/intermittently (grand children live near by, cannot provide 24/7 support) Type of Home: House Home Access: Stairs to enter Entrance Stairs-Rails: Psychiatric nurse of Steps: 7 Home Layout: One level Home Equipment: Bedside commode;Walker - 4 wheels;Grab bars - toilet (grab bars down the hallway from living room to bathroom) Additional Comments: [name] and [place] live with pt; neither of which can physically assist pt  Prior Function Level of Independence: Independent with assistive device(s)               Hand Dominance   Dominant Hand: Right    Extremity/Trunk Assessment   Upper Extremity Assessment Upper Extremity Assessment: Defer to OT evaluation    Lower Extremity Assessment Lower Extremity Assessment: RLE deficits/detail RLE Deficits / Details: able to initiate quad set and glut squeeze, able to complete LAQ in sitting, minimal active hip flexion due to hip pain    Cervical / Trunk  Assessment Cervical / Trunk Assessment: Normal  Communication   Communication: HOH  Cognition Arousal/Alertness: Awake/alert Behavior During Therapy: WFL for tasks assessed/performed Overall Cognitive Status: No family/caregiver present to determine baseline cognitive functioning                                 General Comments: pt is extremely HOH, grandson present and reports that pt is very independent and private and doesn't tell anyone her business. Per grandson pt is "surprisingly very with it". pt with decreased insight to deficits and safety. adamantly refusing SNF and wanting to go home despite requiring maxA for all mobility.      General Comments General comments (skin integrity, edema, etc.): SpO2 >90% on 2Lo2 via [place]    Exercises General Exercises - Lower Extremity Ankle Circles/Pumps: AROM;Both;10 reps;Supine Quad Sets: AROM;Right;10 reps;Supine Gluteal Sets: AROM;Both;10 reps;Seated Long Arc Quad: PROM;Right;10 reps;Seated   Assessment/Plan    PT Assessment Patient needs continued PT services  PT Problem List Decreased strength;Decreased range of motion;Decreased activity tolerance;Decreased balance;Decreased mobility;Decreased coordination;Decreased cognition;Decreased knowledge of use of DME;Decreased safety awareness       PT Treatment Interventions DME instruction;Gait training;Stair training;Functional mobility training;Therapeutic activities;Therapeutic exercise;Balance training    PT Goals (Current goals can be found in the Care Plan section)  Acute Rehab PT Goals Patient Stated Goal: home PT Goal Formulation: With patient Time For Goal Achievement: 09/22/20 Potential to Achieve Goals: Fair    Frequency Min 3X/week   Barriers to discharge Decreased caregiver support;Inaccessible home environment pt with 7 steps to enter, roommates unable to provide maxA    Co-evaluation               AM-PAC PT "6 Clicks" Mobility  Outcome  Measure Help needed turning from your back to your side while in a flat bed without using bedrails?: A Lot Help needed moving from lying on your back to sitting on the side of a flat bed without using bedrails?: A Lot Help needed moving to and from a bed to a chair (including a wheelchair)?: Total Help needed standing up from a chair using your arms (e.g., wheelchair or bedside chair)?: Total Help needed to walk in hospital room?: Total Help needed climbing 3-5 steps with a railing? : Total 6 Click Score: 8    End of Session Equipment Utilized During Treatment: Gait belt;Oxygen Activity Tolerance: Patient tolerated treatment well Patient left: in bed;with call bell/phone within reach;with bed alarm set;with family/visitor present Nurse Communication: Mobility status PT Visit Diagnosis: Unsteadiness on feet (R26.81);Difficulty in walking, not elsewhere classified (R26.2)    Time: 5462-7035 PT Time Calculation (min) (ACUTE ONLY): 28 min   Charges:   PT Evaluation $PT Eval Moderate Complexity: 1 Mod PT Treatments $Therapeutic Activity: 8-22 mins        Kittie Plater, PT, DPT Acute Rehabilitation Services Pager #: [phone] Office #: [phone]   Berline Lopes  09/08/2020, 2:55 PM

## 2020-09-08 NOTE — Progress Notes (Signed)
FPTS Interim Progress Note  Patient sleeping and resting comfortably.  Rounded with primary RN.  No concerns voiced.  No orders required.  Appreciated nightly round.  Today's Vitals   09/07/20 1710 09/07/20 2128 09/08/20 0100 09/08/20 0334  BP: (!) 100/51 103/69  (!) 107/53  Pulse: 64 (!) 103  78  Resp: 19 18  18   Temp: (!) 97.5 F (36.4 C) 98 F (36.7 C)  98.5 F (36.9 C)  TempSrc: Oral Oral  Oral  SpO2: 98% 100%  99%  Weight:      Height:      PainSc:  0-No pain Asleep      Carollee Leitz, MD 09/08/2020, 3:48 AM PGY-2, [place] [facility] Service pager [phone]

## 2020-09-08 NOTE — Discharge Instructions (Signed)
    1. Change dressings as needed 2. May shower but keep incisions covered and dry 3. Take eliquis to prevent blood clots 4. Take stool softeners as needed 5. Take pain meds as needed   Information on my medicine - ELIQUIS (apixaban)   Why was Eliquis prescribed for you? Eliquis was prescribed for you to reduce the risk of a blood clot forming that can cause a stroke if you have a medical condition called atrial fibrillation (a type of irregular heartbeat).  What do You need to know about Eliquis ? Take your Eliquis TWICE DAILY - one tablet in the morning and one tablet in the evening with or without food. If you have difficulty swallowing the tablet whole please discuss with your pharmacist how to take the medication safely.  Take Eliquis exactly as prescribed by your doctor and DO NOT stop taking Eliquis without talking to the doctor who prescribed the medication.  Stopping may increase your risk of developing a stroke.  Refill your prescription before you run out.  After discharge, you should have regular check-up appointments with your healthcare provider that is prescribing your Eliquis.  In the future your dose may need to be changed if your kidney function or weight changes by a significant amount or as you get older.  What do you do if you miss a dose? If you miss a dose, take it as soon as you remember on the same day and resume taking twice daily.  Do not take more than one dose of ELIQUIS at the same time to make up a missed dose.  Important Safety Information A possible side effect of Eliquis is bleeding. You should call your healthcare provider right away if you experience any of the following: ? Bleeding from an injury or your nose that does not stop. ? Unusual colored urine (red or dark brown) or unusual colored stools (red or black). ? Unusual bruising for unknown reasons. ? A serious fall or if you hit your head (even if there is no bleeding).  Some medicines  may interact with Eliquis and might increase your risk of bleeding or clotting while on Eliquis. To help avoid this, consult your healthcare provider or pharmacist prior to using any new prescription or non-prescription medications, including herbals, vitamins, non-steroidal anti-inflammatory drugs (NSAIDs) and supplements.  This website has more information on Eliquis (apixaban): http://www.eliquis.com/eliquis/home

## 2020-09-08 NOTE — Plan of Care (Signed)
  Problem: Activity: Goal: Ability to avoid complications of mobility impairment will improve Outcome: Progressing   Problem: Pain Management: Goal: Pain level will decrease Outcome: Progressing   Problem: Skin Integrity: Goal: Signs of wound healing will improve Outcome: Progressing

## 2020-09-08 NOTE — Progress Notes (Addendum)
Family Medicine Teaching Service Daily Progress Note Intern Pager: [phone]  Patient name: [name] Medical record number: [id number] Date of birth: Mar 04, 1928 Age: 87 y.o. Gender: female  Primary Care Provider: Ria Bush, MD Consultants: Ortho Code Status: Full  Pt Overview and Major Events to Date:  6/4 - Admitted 6/6 - Right LE intramedullary nail  Assessment and Plan: [name] a 87 y.o.femalepresenting with right hip fracture after mechanical fall at home. PMH is significant forparoxsmal afib on eliquis, h/o provoked DVT, T2DM, HLD, HFpEF (EF 50-55%), depression/anxiety, GERD, CAD, CKD III, h/o skin cancer, chronic pain 2/2 spinal stenosis  Right femoral neck fracture s/p intramedullary nail POD #1. Pain overall well controlled, able to move foot. Will return in 2 weeks for stable removal. - Ortho following, appreciate recs - PT/OT following - Weight bearing as tolerated - Pain regimen: Tylenol 650mg  q6h, fentanyl 4mcg q2h PRN, Norco/vicodin q6h PRN - Continue to monitor pain   Leukocytosis, c/f UTI WBC 14.3>12.1, UA equivocal with urine culture collected on 6/6. S/p cefazolin for surgical prophylaxis on 6/6. Consider initiation of Keflex 500mg  q12h x3 days. - Follow-up urine culture  Paroxysmal atrial fibrillation On chronic anticoagulation, was held for surgery but restarting today. - Cardiac telemetry - Resume Eliquis BID - Continue home metoprolol 50mg  BID and diltiazem 120mg  daily  Type 2 DM Chronic, well controlled. CBG 169-205 and patient received 2 U Aspart in the last 24h. - Holding home Januvia 50mg  daily and pioglitazone 15mg  daily - sSSI with meals - CBGs qAC and QHS  HTN BP normotensive - Continue home metoprolol and diltiazem   HLD - Continue home pravastatin 20mg  daily  GERD  H/o PUD - Continue home protonix BID - Mylanta/maalox PRN  Depression/GAD - Continue home zoloft 50mg  daily and xanax 0.5mg  PRN  HRpEF  CAD  s/p CABG 1996 - Continue home metoprolol - Continue home Lasix 40mg  BID - Cardiac telemetry  CKD IIIa Creatinine 1.16, GFR 45. - Continue to monitor with BMP  Chronic pain 2/2 spinal stenosis of lumbosacral region Pain regimen at home Norco 5-325mg  1/2 tablet BID PRN - Acute pain regimen as above - Senna BID - Miralax daily  FEN/GI: Heart healthy PPx: Eliquis   Status is: Inpatient  Remains inpatient appropriate because:Unsafe d/c plan and Inpatient level of care appropriate due to severity of illness   Dispo: The patient is from: Home              Anticipated d/c is to: SNF              Patient currently is not medically stable to d/c.   Difficult to place patient No    Subjective:  Patient reports she needs to have a bowel movement.  Objective: Temp:  [97.5 F (36.4 C)-98.8 F (37.1 C)] 98.4 F (36.9 C) (06/07 0902) Pulse Rate:  [63-127] 63 (06/07 0902) Resp:  [16-23] 20 (06/07 0902) BP: (95-117)/(43-77) 105/53 (06/07 0902) SpO2:  [91 %-100 %] 98 % (06/07 0902) Weight:  [62.3 kg] 62.3 kg (06/06 1348) Physical Exam: General: NAD, supine in bed, leaning more on her right side, very hard of hearing Cardiovascular: RRR, no peripheral edema Respiratory: breathing comfortably on 4L [place], no increased WOB Extremities: moving all extremities spontaneously, able to move toes, dressing on RLE C/D/I  Laboratory: Recent Labs  Lab 09/06/20 0233 09/07/20 0333 09/08/20 0248  WBC 14.0* 14.3* 12.1*  HGB 12.8 13.3 13.5  HCT 41.5 42.8 43.5  PLT 203 195 144*  Recent Labs  Lab 09/06/20 0233 09/07/20 0333 09/08/20 0248  NA 136 135 136  K 3.2* 3.9 4.3  CL 98 97* 98  CO2 25 30 27   BUN 25* 31* 32*  CREATININE 1.07* 1.21* 1.16*  CALCIUM 8.5* 9.4 9.2  GLUCOSE 430* 174* 233*      Imaging/Diagnostic Tests: DG C-Arm 1-60 Min  Result Date: 09/07/2020 CLINICAL DATA:  Right intramedullary nail fixation. EXAM: DG C-ARM 1-60 MIN; OPERATIVE RIGHT HIP WITH PELVIS  FLUOROSCOPY TIME:  Fluoroscopy Time:  1 minutes 14 seconds. Radiation Exposure Index (if provided by the fluoroscopic device): 18.57 mGy. Number of Acquired Spot Images: 4 COMPARISON:  September 05, 2020. FINDINGS: Four C-arm fluoroscopic images were obtained intraoperatively and submitted for post operative interpretation. Initial images demonstrate right femoral neck fracture, better characterized on recent radiographs. Subsequent images demonstrate fixation of the fracture with intramedullary nail and screws with hardware partially imaged. Alignment appears near anatomic. No unexpected findings. Vascular calcifications. Please see the performing provider's procedural report for further detail. IMPRESSION: Intraoperative fluoroscopy, as detailed above. Electronically Signed   By: Margaretha Sheffield MD   On: 09/07/2020 15:35   DG HIP OPERATIVE UNILAT WITH PELVIS RIGHT  Result Date: 09/07/2020 CLINICAL DATA:  Right intramedullary nail fixation. EXAM: DG C-ARM 1-60 MIN; OPERATIVE RIGHT HIP WITH PELVIS FLUOROSCOPY TIME:  Fluoroscopy Time:  1 minutes 14 seconds. Radiation Exposure Index (if provided by the fluoroscopic device): 18.57 mGy. Number of Acquired Spot Images: 4 COMPARISON:  September 05, 2020. FINDINGS: Four C-arm fluoroscopic images were obtained intraoperatively and submitted for post operative interpretation. Initial images demonstrate right femoral neck fracture, better characterized on recent radiographs. Subsequent images demonstrate fixation of the fracture with intramedullary nail and screws with hardware partially imaged. Alignment appears near anatomic. No unexpected findings. Vascular calcifications. Please see the performing provider's procedural report for further detail. IMPRESSION: Intraoperative fluoroscopy, as detailed above. Electronically Signed   By: Margaretha Sheffield MD   On: 09/07/2020 15:35     [name] Patience, DO 09/08/2020, 9:09 AM PGY-1, [place] Intern pager:  [phone], text pages welcome

## 2020-09-09 ENCOUNTER — Inpatient Hospital Stay (HOSPITAL_COMMUNITY): Payer: Medicare Other

## 2020-09-09 ENCOUNTER — Telehealth: Payer: Self-pay | Admitting: Family Medicine

## 2020-09-09 LAB — CBC
HCT: 34.4 % — ABNORMAL LOW (ref 36.0–46.0)
Hemoglobin: 10.8 g/dL — ABNORMAL LOW (ref 12.0–15.0)
MCH: 29.4 pg (ref 26.0–34.0)
MCHC: 31.4 g/dL (ref 30.0–36.0)
MCV: 93.7 fL (ref 80.0–100.0)
Platelets: 151 10*3/uL (ref 150–400)
RBC: 3.67 MIL/uL — ABNORMAL LOW (ref 3.87–5.11)
RDW: 15.9 % — ABNORMAL HIGH (ref 11.5–15.5)
WBC: 13.7 10*3/uL — ABNORMAL HIGH (ref 4.0–10.5)
nRBC: 0 % (ref 0.0–0.2)

## 2020-09-09 LAB — URINE CULTURE

## 2020-09-09 LAB — GLUCOSE, CAPILLARY
Glucose-Capillary: 157 mg/dL — ABNORMAL HIGH (ref 70–99)
Glucose-Capillary: 209 mg/dL — ABNORMAL HIGH (ref 70–99)
Glucose-Capillary: 201 mg/dL — ABNORMAL HIGH (ref 70–99)
Glucose-Capillary: 215 mg/dL — ABNORMAL HIGH (ref 70–99)

## 2020-09-09 LAB — BASIC METABOLIC PANEL
Anion gap: 8 (ref 5–15)
BUN: 40 mg/dL — ABNORMAL HIGH (ref 8–23)
CO2: 26 mmol/L (ref 22–32)
Calcium: 8.4 mg/dL — ABNORMAL LOW (ref 8.9–10.3)
Chloride: 100 mmol/L (ref 98–111)
Creatinine, Ser: 1.06 mg/dL — ABNORMAL HIGH (ref 0.44–1.00)
GFR, Estimated: 50 mL/min — ABNORMAL LOW (ref >60)
Glucose, Bld: 174 mg/dL — ABNORMAL HIGH (ref 70–99)
Potassium: 4.4 mmol/L (ref 3.5–5.1)
Sodium: 134 mmol/L — ABNORMAL LOW (ref 135–145)

## 2020-09-09 MED ORDER — ACETAMINOPHEN 325 MG PO TABS
650.0000 mg | ORAL_TABLET | Freq: Four times a day (QID) | ORAL | Status: DC
  Administered 2020-09-09 – 2020-09-14 (×21): 650 mg via ORAL
  Filled 2020-09-09 (×21): qty 2

## 2020-09-09 NOTE — Progress Notes (Signed)
Physical Therapy Treatment Patient Details Name: [name] MRN: [id number] DOB: February 26, 1928 Today's Date: 09/09/2020    History of Present Illness [name] is a 87 y.o. female presenting with right hip fracture after mechanical fall at home, s/p IM nail on 6/6. PMH is significant for paroxsmal afib on eliquis, h/o provoked DVT, T2DM, HLD, HFpEF (EF 50-55%), depression/anxiety, GERD, CAD, CKD III, h/o skin cancer, chronic pain 2/2 spinal stenosis    PT Comments    Pt continues to have increased R hip pain with any movement greatly limiting pt's ability to actively participate in exercises and mobility. Pt requiring maxAX2 for all transfers and suspect will be dependent for ADLs. Pt did agree she will need to go to rehab at d/c as her 36yo boyfriend can't care for her, neither her family stating "It's such a toxic situation." Acute PT to cont to follow to progress mobility.    Follow Up Recommendations  SNF;Supervision/Assistance - 24 hour     Equipment Recommendations  Rolling walker with 5" wheels (may need w/c)    Recommendations for Other Services       Precautions / Restrictions Precautions Precautions: Fall Restrictions Weight Bearing Restrictions: Yes RLE Weight Bearing: Weight bearing as tolerated    Mobility  Bed Mobility Overal bed mobility: Needs Assistance Bed Mobility: Supine to Sit     Supine to sit: Max assist;HOB elevated;+2 for physical assistance     General bed mobility comments: pt with some effort bring LEs to EOB, maxAx2 for trunk elevation, helicopter type transfer    Transfers Overall transfer level: Needs assistance Equipment used: Rolling walker (2 wheeled) Transfers: Sit to/from [facility] Sit to Stand: Max assist;+2 physical assistance Stand pivot transfers: Max assist;+2 physical assistance       General transfer comment: maxAx2 to stand up to RW, pt with fear of falling and noted increased R LE pain, pt then  started trying to take steps to chair, requiring max directoinal verbal cues for safety and max physical assist to prevent fall  Ambulation/Gait             General Gait Details: uanble at this time   Chief Strategy Officer    Modified Rankin (Stroke Patients Only)       Balance Overall balance assessment: Needs assistance Sitting-balance support: Feet supported;Bilateral upper extremity supported Sitting balance-Leahy Scale: Fair     Standing balance support: Bilateral upper extremity supported;During functional activity Standing balance-Leahy Scale: Zero Standing balance comment: dependent on physical assist and RW to maintain standing                            Cognition Arousal/Alertness: Awake/alert Behavior During Therapy: WFL for tasks assessed/performed Overall Cognitive Status: No family/caregiver present to determine baseline cognitive functioning                                 General Comments: pt more aware of deficits and understands she can't go despite that being her greatest desire. Pt is aware her 33yo boyfriend can't help her and her family can't provide assist either. Pt agreeable to SNF, pt's lack of command follow I believe is due predominately her [facility]      Exercises General Exercises - Lower Extremity Ankle Circles/Pumps: AROM;Both;10 reps;Supine Quad Sets: AROM;Right;10 reps;Supine Gluteal Sets:  AROM;Both;10 reps;Seated Long Arc Quad: AAROM;Right;10 reps;Seated    General Comments General comments (skin integrity, edema, etc.): SpO2 into low 80s on RA, pt placed on 2LO2 and >90%      Pertinent Vitals/Pain Pain Assessment: Faces Faces Pain Scale: Hurts whole lot Pain Location: R LE with WBing during std pvt transfer Pain Descriptors / Indicators: Moaning;Discomfort Pain Intervention(s): Monitored during session (RN notified however not time for pain medicine)    Home Living                       Prior Function            PT Goals (current goals can now be found in the care plan section) Acute Rehab PT Goals Patient Stated Goal: home Progress towards PT goals: Progressing toward goals    Frequency    Min 3X/week      PT Plan Current plan remains appropriate    Co-evaluation              AM-PAC PT "6 Clicks" Mobility   Outcome Measure  Help needed turning from your back to your side while in a flat bed without using bedrails?: A Lot Help needed moving from lying on your back to sitting on the side of a flat bed without using bedrails?: A Lot Help needed moving to and from a bed to a chair (including a wheelchair)?: Total Help needed standing up from a chair using your arms (e.g., wheelchair or bedside chair)?: Total Help needed to walk in hospital room?: Total Help needed climbing 3-5 steps with a railing? : Total 6 Click Score: 8    End of Session Equipment Utilized During Treatment: Gait belt;Oxygen Activity Tolerance: Patient limited by pain Patient left: in chair;with call bell/phone within reach;with chair alarm set Nurse Communication: Mobility status PT Visit Diagnosis: Unsteadiness on feet (R26.81);Difficulty in walking, not elsewhere classified (R26.2)     Time: 3754-3606 PT Time Calculation (min) (ACUTE ONLY): 27 min  Charges:  $Therapeutic Exercise: 8-22 mins $Therapeutic Activity: 8-22 mins                     Kittie Plater, PT, DPT Acute Rehabilitation Services Pager #: [phone] Office #: [phone]    Berline Lopes 09/09/2020, 11:19 AM

## 2020-09-09 NOTE — Plan of Care (Signed)
°  Problem: Activity: °Goal: Ability to avoid complications of mobility impairment will improve °Outcome: Progressing °Goal: Ability to tolerate increased activity will improve °Outcome: Progressing °  °Problem: Education: °Goal: Verbalization of understanding the information provided will improve °Outcome: Progressing °  °

## 2020-09-09 NOTE — Progress Notes (Signed)
Family Medicine Teaching Service Daily Progress Note Intern Pager: [phone]  Patient name: [name] Medical record number: [id number] Date of birth: 1927/12/19 Age: 87 y.o. Gender: female  Primary Care Provider: Ria Bush, MD Consultants: Ortho Code Status: Full  Pt Overview and Major Events to Date:  6/4 - Admitted 6/6 - Right LE intramedullary nail surgery  Assessment and Plan: [name] Littleis a 87 y.o.femalepresenting with right hip fracture, no s/p intramedullary nail. PMH is significant forparoxsmal afib on eliquis, h/o provoked DVT, T2DM, HLD, HFpEF (EF 50-55%), depression/anxiety, GERD, CAD, CKD III, h/o skin cancer, chronic pain 2/2 spinal stenosis  Right femoral neck fracture s/p intramedullary nail POD #2. Patient has used Norco 5 for her PRN pain management in the last 24h. We will go through her pain medications and make adjustments as there appear to be some repeat medications.   - Ortho following, appreciate recs - PT/OT following - Weight bearing as tolerated - Pain regimen: Tylenol 650mg  q6h, fentanyl 60mcg q2h PRN, Norco/vicodin q6h PRN - Continue to monitor pain   Leukocytosis, presumed UTI WBC still elevated at 13.7. Called lab on 6/7 and urine culture was not actually present, given initial concern and age, started antibiotics for treatment. Patient s/p 2 doses of cephazolin and transitioned to Keflex to finish out 5 day course - Continue Kelfex (day 3/5)  - Monitor for fever  Paroxysmal atrial fibrillation Patient resumed Eliquis BID on 6/7, Hgb decreased today at 10.8. No signs of active bleeding on physical exam. - Cardiac telemetry - Continue Eliquis 5mg  BID - Continue home metprolol 50mg  BID and diltiazem 120mg  daily.  Normocytic anemia Hgb 13.5>10.8, recently had surgery. Currently no signs of bleeding but will continue to monitor vitals and repeat CBC in the AM, if vitals unstable or concerning then we will get another CBC today. -  CBC in the AM   Type 2 DM Chronic, well controlled. CBG [id number] and patient received 4 U Aspart in the last 24h. - Holding home Januvia 50mg  daily and pioglitazone 15mg  daily - sSSI with meals - CBGs qAC and QHS  HTN BP normotensive - Continue home metoprolol and diltiazem   HLD - Continue home pravastatin 20mg  daily  GERD  H/o PUD - Continue home protonix BID - Mylanta/maalox PRN  Depression/GAD  - Continue home zoloft 50mg  daily and xanax 0.5mg  PRN  HRpEF  CAD s/p CABG 1996 - Continue home metoprolol - Continue home Lasix 40mg  BID - Cardiac telemetry  CKD IIIa Creatinine 1.16, GFR 45. - Continue to monitor with BMP  Chronic pain 2/2 spinal stenosis of lumbosacral region Pain regimen at home Norco 5-325mg  1/2 tablet BID PRN - Acute pain regimen as above - Senna BID - Miralax daily  FEN/GI: Heart healthy PPx: Eliquis   Status is: Inpatient  Remains inpatient appropriate because:Ongoing active pain requiring inpatient pain management and Inpatient level of care appropriate due to severity of illness   Dispo: The patient is from: Home              Anticipated d/c is to: SNF              Patient currently is not medically stable to d/c.   Difficult to place patient No    Subjective:  Patient has no complaints other than her hip pain today.  She states her pain is a 7/10 and has not yet gotten her medications.  She denies any chest pain, dizziness, headaches, shortness of breath.  Nurse and  she states that she had 2 bowel movements yesterday that he may not have been documented.  Objective: Temp:  [98 F (36.7 C)-98.5 F (36.9 C)] 98 F (36.7 C) (06/08 0713) Pulse Rate:  [67-83] 82 (06/08 0722) Resp:  [17-18] 17 (06/08 0713) BP: (95-126)/(49-86) 126/77 (06/08 0713) SpO2:  [96 %-100 %] 96 % (06/08 0713) Physical Exam: General: NAD, supine in bed, leaning more on her right side, very hard of hearing Cardiovascular: RRR, no peripheral  edema Respiratory: breathing comfortably on 2L [place], no increased WOB Extremities: moving all extremities spontaneously, able to move toes, dressing on RLE C/D/I. Right hip tender to palpation, hematoma present but not excessively large, no evidence of bleeding at this time.  Laboratory: Recent Labs  Lab 09/07/20 0333 09/08/20 0248 09/09/20 0314  WBC 14.3* 12.1* 13.7*  HGB 13.3 13.5 10.8*  HCT 42.8 43.5 34.4*  PLT 195 144* 151   Recent Labs  Lab 09/07/20 0333 09/08/20 0248 09/09/20 0314  NA 135 136 134*  K 3.9 4.3 4.4  CL 97* 98 100  CO2 30 27 26   BUN 31* 32* 40*  CREATININE 1.21* 1.16* 1.06*  CALCIUM 9.4 9.2 8.4*  GLUCOSE 174* 233* 174*    Imaging/Diagnostic Tests: No results found.   [name], DO 09/09/2020, 9:50 AM PGY-1, [place] Intern pager: [phone], text pages welcome

## 2020-09-09 NOTE — Telephone Encounter (Signed)
Mr. Merlinda Frederick call back wanting to speak with  Lattie Haw . Requesting a call back

## 2020-09-09 NOTE — Telephone Encounter (Signed)
[name] called in and wanted to speak with Lattie Haw due to Ms. Micheli is in the hospital due to she fell down.

## 2020-09-09 NOTE — Progress Notes (Signed)
Subjective: 2 Days Post-Op Procedure(s) (LRB): INTRAMEDULLARY (IM) NAIL INTERTROCHANTRIC (Right) Patient reports pain as mild.    Objective: Vital signs in last 24 hours: Temp:  [98 F (36.7 C)-98.5 F (36.9 C)] 98 F (36.7 C) (06/08 0713) Pulse Rate:  [63-83] 82 (06/08 0722) Resp:  [17-20] 17 (06/08 0713) BP: (95-126)/(49-86) 126/77 (06/08 0713) SpO2:  [96 %-100 %] 96 % (06/08 0713)  Intake/Output from previous day: No intake/output data recorded. Intake/Output this shift: No intake/output data recorded.  Recent Labs    09/07/20 0333 09/08/20 0248 09/09/20 0314  HGB 13.3 13.5 10.8*   Recent Labs    09/08/20 0248 09/09/20 0314  WBC 12.1* 13.7*  RBC 4.68 3.67*  HCT 43.5 34.4*  PLT 144* 151   Recent Labs    09/08/20 0248 09/09/20 0314  NA 136 134*  K 4.3 4.4  CL 98 100  CO2 27 26  BUN 32* 40*  CREATININE 1.16* 1.06*  GLUCOSE 233* 174*  CALCIUM 9.2 8.4*   No results for input(s): LABPT, INR in the last 72 hours.  Neurologically intact Neurovascular intact Sensation intact distally Intact pulses distally Dorsiflexion/Plantar flexion intact Incision: dressing C/D/I No cellulitis present Compartment soft   Assessment/Plan: 2 Days Post-Op Procedure(s) (LRB): INTRAMEDULLARY (IM) NAIL INTERTROCHANTRIC (Right) Up with therapy WBAT RLE Ok to continue Eliquis F/u with Dr. Erlinda Hong two weeks post-op     Aundra Dubin 09/09/2020, 8:15 AM

## 2020-09-09 NOTE — Telephone Encounter (Signed)
Returned Mr. Treadway's call.  States pt fell and broke her hip and was admitted to the hospital [facility]) .   He wanted to let Dr. Darnell Level know and that they are saying she needs to go to a facility before she can come home.  States he will call back with other info as he gets it.

## 2020-09-09 NOTE — Telephone Encounter (Addendum)
Noted. I will follow along with hospital notes. She had hip repair surgery 09/07/2020.

## 2020-09-09 NOTE — Progress Notes (Signed)
FPTS Interim Progress Note  Patient sleeping and resting comfortably.  Rounded with primary RN.  No concerns voiced.  No orders required.  Appreciated nightly round.  Today's Vitals   09/08/20 1540 09/08/20 2000 09/08/20 2159 09/08/20 2205  BP: 98/86  (!) 95/49   Pulse: 83  67   Resp: 18  18   Temp: 98.1 F (36.7 C)  98.5 F (36.9 C)   TempSrc:      SpO2: 100%  96%   Weight:      Height:      PainSc:  0-No pain  6        Carollee Leitz, MD 09/09/2020, 4:58 AM PGY-2, [place] Medicine Service pager [phone]

## 2020-09-09 NOTE — TOC Progression Note (Addendum)
Transition of Care [facility]) - Progression Note    Patient Details  Name: HAJAR PENNINGER MRN: [id number] Date of Birth: 1928/04/03  Transition of Care [facility]) CM/SW Contact  Milinda Antis, [place] Phone Number: 09/09/2020, 1:51 PM  Clinical Narrative:    CSW met with the patient at bedside to discuss possible SNF placement at discharge.  The patient was alert and oriented x4 during the encounter.  The patient is now willing to receive rehab at a SNF.  The patient preference is for wherever her family decides, but not [facility].    CSW spoke with the patient's grand daughter, [name], and presented bed offers. Ms. Stann Mainland is reviewing the agencies that have accepted and asked the CSW follow up with [facility] as this would be the facility of choice.  CSW called the admissions director with [facility] and left a VM requesting a returned call.      Expected Discharge Plan: [place] Barriers to Discharge: Continued Medical Work up  Expected Discharge Plan and Services Expected Discharge Plan: [place] arrangements for the past 2 months: Single Family Home                                       Social Determinants of Health (SDOH) Interventions    Readmission Risk Interventions Readmission Risk Prevention Plan 09/04/2019  Transportation Screening Complete  PCP or Specialist Appt within 3-5 Days Complete  HRI or Home Care Consult Complete  Palliative Care Screening Not Applicable  Medication Review (RN Care Manager) Complete  Some recent data might be hidden

## 2020-09-10 LAB — CBC
HCT: 33.6 % — ABNORMAL LOW (ref 36.0–46.0)
Hemoglobin: 10.4 g/dL — ABNORMAL LOW (ref 12.0–15.0)
MCH: 29 pg (ref 26.0–34.0)
MCHC: 31 g/dL (ref 30.0–36.0)
MCV: 93.6 fL (ref 80.0–100.0)
Platelets: 180 10*3/uL (ref 150–400)
RBC: 3.59 MIL/uL — ABNORMAL LOW (ref 3.87–5.11)
RDW: 15.7 % — ABNORMAL HIGH (ref 11.5–15.5)
WBC: 10.2 10*3/uL (ref 4.0–10.5)
nRBC: 0 % (ref 0.0–0.2)

## 2020-09-10 LAB — GLUCOSE, CAPILLARY
Glucose-Capillary: 142 mg/dL — ABNORMAL HIGH (ref 70–99)
Glucose-Capillary: 253 mg/dL — ABNORMAL HIGH (ref 70–99)
Glucose-Capillary: 172 mg/dL — ABNORMAL HIGH (ref 70–99)
Glucose-Capillary: 149 mg/dL — ABNORMAL HIGH (ref 70–99)

## 2020-09-10 NOTE — Progress Notes (Signed)
Occupational Therapy Treatment Patient Details Name: [name] MRN: [id number] DOB: [birth year] Today's Date: 09/10/2020    History of present illness [name] is a 87 y.o. female presenting with right hip fracture after mechanical fall at home, s/p IM nail on 6/6. PMH is significant for paroxsmal afib on eliquis, h/o provoked DVT, T2DM, HLD, HFpEF (EF 50-55%), depression/anxiety, GERD, CAD, CKD III, h/o skin cancer, chronic pain 2/2 spinal stenosis   OT comments  Patient stating she wants to try and have a bowel movement. Patient needing max A for bed mobility to manage trunk and lower extremities to edge of bed and cries out in pain. Patient fearful of falling and needing max reassurance with cues for deep breathing to calm her. With bed height elevated patient max A to stand from bed then total A with use of stedy to transfer to bedside commode. Patient able to void and pass gas, unable to have bowel movement. Min A for peri care then max A to power up to standing and use of stedy to transfer to recliner chair. Patient needs consistent reassurance throughout to deep breath and follow directions as she is very fearful of pain and falling.    Follow Up Recommendations  SNF;Supervision/Assistance - 24 hour    Equipment Recommendations  Other (comment) (defer to next venue)       Precautions / Restrictions Precautions Precautions: Fall Restrictions RLE Weight Bearing: Weight bearing as tolerated       Mobility Bed Mobility Overal bed mobility: Needs Assistance Bed Mobility: Rolling;Sidelying to Sit Rolling: Max assist Sidelying to sit: Max assist;HOB elevated       General bed mobility comments: patient needing cues for sequencing and max A for trunk support and managing lower extremites off bed    Transfers Overall transfer level: Needs assistance   Transfers: Sit to/from Stand;Stand Pivot Transfers Sit to Stand: Max assist;From elevated surface Stand pivot  transfers: Total assist       General transfer comment: max A to power up to standing from elevated bed height, total A first to bedside commode then to recliner with use of stedy patient very fearful of falling    Balance Overall balance assessment: Needs assistance Sitting-balance support: Feet supported;Bilateral upper extremity supported Sitting balance-Leahy Scale: Poor     Standing balance support: Bilateral upper extremity supported Standing balance-Leahy Scale: Zero Standing balance comment: max to power up to standing                           ADL either performed or assessed with clinical judgement   ADL Overall ADL's : Needs assistance/impaired                         Toilet Transfer: Maximal assistance;Total assistance;BSC Toilet Transfer Details (indicate cue type and reason): max A x1 to power up to standing from elevated bed height, total A with use of stedy to transfer patient to bedside commode then to recliner. becomes very tearful and anxious however with reassurance and cues for deep breathing patient able to calm down. Toileting- Clothing Manipulation and Hygiene: Minimal assistance;Sitting/lateral lean Toileting - Clothing Manipulation Details (indicate cue type and reason): for peri care after voiding     Functional mobility during ADLs: Maximal assistance;Total assistance        Cognition Arousal/Alertness: Awake/alert Behavior During Therapy: Anxious Overall Cognitive Status: No family/caregiver present to determine baseline cognitive functioning  General Comments: needing some repeated direction however patient HOH, becomes very anxious due to pain and fear of falling needing max reassurance and cues for deep breathing to calm                   Pertinent Vitals/ Pain       Pain Assessment: Faces Faces Pain Scale: Hurts whole lot Pain Location: R LE with mobility Pain  Descriptors / Indicators: Moaning;Crying Pain Intervention(s): Monitored during session         Frequency  Min 2X/week        Progress Toward Goals  OT Goals(current goals can now be found in the care plan section)  Progress towards OT goals: Progressing toward goals  Acute Rehab OT Goals Patient Stated Goal: home OT Goal Formulation: With patient Time For Goal Achievement: 09/22/20 Potential to Achieve Goals: Fair ADL Goals Pt Will Perform Grooming: with supervision;sitting Pt Will Perform Lower Body Dressing: with min assist;sit to/from stand Pt Will Transfer to Toilet: stand pivot transfer;with min guard assist;bedside commode Pt Will Perform Toileting - Clothing Manipulation and hygiene: with supervision;sitting/lateral leans  Plan Discharge plan remains appropriate       AM-PAC OT "6 Clicks" Daily Activity     Outcome Measure   Help from another person eating meals?: None Help from another person taking care of personal grooming?: A [place] Help from another person toileting, which includes using toliet, bedpan, or urinal?: A Lot Help from another person bathing (including washing, rinsing, drying)?: A Lot Help from another person to put on and taking off regular upper body clothing?: A Lot Help from another person to put on and taking off regular lower body clothing?: Total 6 Click Score: 14    End of Session Equipment Utilized During Treatment: Gait belt;Oxygen  OT Visit Diagnosis: Other abnormalities of gait and mobility (R26.89);History of falling (Z91.81);Muscle weakness (generalized) (M62.81);Pain Pain - Right/Left: Right Pain - part of body: Leg;Hip   Activity Tolerance Patient limited by pain;Patient tolerated treatment well   Patient Left in chair;with call bell/phone within reach;with chair alarm set   Nurse Communication Mobility status;Other (comment) (need new purewick)        Time: 1031-5945 OT Time Calculation (min): 39 min  Charges: OT  General Charges $OT Visit: 1 Visit OT Treatments $Self Care/Home Management : 38-52 mins  Delbert Phenix OT OT pager: [place] 09/10/2020, 1:34 PM

## 2020-09-10 NOTE — Progress Notes (Addendum)
FPTS Interim Progress Note  Patient sleeping and resting comfortably.  Not requiring supplemental oxygen although noted that it was resting on forehead.  Rounded with primary RN.  No concerns voiced.  No orders required.  Appreciated nightly round.  Today's Vitals   09/09/20 1845 09/09/20 2000 09/09/20 2134 09/09/20 2136  BP:   (!) 108/59   Pulse: 80  (!) 59   Resp:   18   Temp:   98.1 F (36.7 C)   TempSrc:   Oral   SpO2:   95%   Weight:      Height:      PainSc:  0-No pain  7    Wean oxygen to discontinue.  Maintain O2 sats> 92% Continue to monitor   Carollee Leitz, MD 09/10/2020, 4:40 AM PGY-2, [place] Medicine Service pager [phone]

## 2020-09-10 NOTE — Progress Notes (Addendum)
Family Medicine Teaching Service Daily Progress Note Intern Pager: [phone]  Patient name: [name] Medical record number: [id number] Date of birth: 03-14-1928 Age: 87 y.o. Gender: female  Primary Care Provider: Ria Bush, MD Consultants: Ortho Code Status: Full  Pt Overview and Major Events to Date:    Assessment and Plan: CHAUNCEY BRUNO is a 87 y.o. female presenting with right hip fracture after mechanical fall at home. PMH is significant for paroxsmal afib on eliquis, h/o provoked DVT, T2DM, HLD, HFpEF (EF 50-55%), depression/anxiety, GERD, CAD, CKD III, h/o skin cancer, chronic pain 2/2 spinal stenosis.   Right femoral neck fracture s/p intramedullary nail Post Op Day 3.  Awaiting SNF Placement.  Not currently on any O2. Medically Stable to Discharge - Ortho following, appreciate recs - PT/OT eval and treat - On Scheduled Tylenol and Oxycodone 5 mg PRN - Discuss with Surgery about CT Pelvis findings  Leukocytosis, c/f UTI Resolved.  WBC now 10.2.   - Continue Keflex (day 4/5)   FEN/GI: Heart healthy PPx: Lovenox   Status is: Inpatient  Remains inpatient appropriate because:Unsafe d/c plan  Dispo: The patient is from: Home              Anticipated d/c is to: SNF              Patient currently is medically stable to d/c.   Difficult to place patient Yes     Subjective:  Patient indicates is feeling okay this morning.  Still having pain.  Objective: Temp:  [97.7 F (36.5 C)-98.1 F (36.7 C)] 97.8 F (36.6 C) (06/09 0808) Pulse Rate:  [59-84] 63 (06/09 0808) Resp:  [14-18] 16 (06/09 0808) BP: (108-115)/(57-64) 109/57 (06/09 0808) SpO2:  [90 %-99 %] 90 % (06/09 7078) Physical Exam:  Physical Exam HENT:     Head: Normocephalic and atraumatic.     Mouth/Throat:     Mouth: Mucous membranes are moist.  Cardiovascular:     Rate and Rhythm: Normal rate.     Pulses: Normal pulses.  Pulmonary:     Effort: Pulmonary effort is normal.      Breath sounds: Normal breath sounds.  Neurological:     Mental Status: She is alert and oriented to person, place, and time.  Psychiatric:        Mood and Affect: Mood normal.        Behavior: Behavior normal.     Laboratory: Recent Labs  Lab 09/08/20 0248 09/09/20 0314 09/10/20 0147  WBC 12.1* 13.7* 10.2  HGB 13.5 10.8* 10.4*  HCT 43.5 34.4* 33.6*  PLT 144* 151 180   Recent Labs  Lab 09/07/20 0333 09/08/20 0248 09/09/20 0314  NA 135 136 134*  K 3.9 4.3 4.4  CL 97* 98 100  CO2 30 27 26   BUN 31* 32* 40*  CREATININE 1.21* 1.16* 1.06*  CALCIUM 9.4 9.2 8.4*  GLUCOSE 174* 233* 174*    Imaging/Diagnostic Tests: No new imaging  Delora Fuel, MD 09/10/2020, 8:33 AM PGY-1, [place] Intern pager: [phone], text pages welcome

## 2020-09-10 NOTE — TOC Progression Note (Addendum)
Transition of Care [facility]) - Progression Note    Patient Details  Name: CHISOM MUNTEAN MRN: [id number] Date of Birth: 06-Sep-1927  Transition of Care [facility]) CM/SW Contact  Milinda Antis, [place] Phone Number: 09/10/2020, 11:02 AM  Clinical Narrative:    CSW spoke with Tiffany in admissions at [facility].  The facility will check on bed availability and return call to [place].  11:36- CSW spoke with Tifffany at [facility] [phone]. The agency could not extend a bed offer on this day.    13:13-  CSW spoke with [name], patient's granddaughter, to relay the information given from [facility].  The family is now considering the possibility of home health or another SNF.  Ms. Stann Mainland will follow up with CSW once she has spoken with the other family members.      Expected Discharge Plan: [place] Barriers to Discharge: Continued Medical Work up  Expected Discharge Plan and Services Expected Discharge Plan: [place] arrangements for the past 2 months: Single Family Home                                       Social Determinants of Health (SDOH) Interventions    Readmission Risk Interventions Readmission Risk Prevention Plan 09/04/2019  Transportation Screening Complete  PCP or Specialist Appt within 3-5 Days Complete  HRI or Home Care Consult Complete  Palliative Care Screening Not Applicable  Medication Review (RN Care Manager) Complete  Some recent data might be hidden

## 2020-09-10 NOTE — Plan of Care (Addendum)
Attempt to wean off oxygen, not successful, room air sat 86, 1L of O2 sat 90, Dr. Manus Rudd aware.   Problem: Activity: Goal: Ability to avoid complications of mobility impairment will improve Outcome: Progressing   Problem: Pain Management: Goal: Pain level will decrease Outcome: Progressing   Problem: Activity: Goal: Risk for activity intolerance will decrease Outcome: Progressing   Problem: Pain Managment: Goal: General experience of comfort will improve Outcome: Progressing   Problem: Safety: Goal: Ability to remain free from injury will improve Outcome: Progressing   Problem: Skin Integrity: Goal: Risk for impaired skin integrity will decrease Outcome: Progressing

## 2020-09-10 NOTE — Progress Notes (Signed)
Patient is comfortable and breathing normal without the nasal canula in place, will wean her off the oxygen for tonight and continue to monitor.her O2sat is 92 room air now.

## 2020-09-11 ENCOUNTER — Inpatient Hospital Stay (HOSPITAL_COMMUNITY): Payer: Medicare Other

## 2020-09-11 DIAGNOSIS — R0902 Hypoxemia: Secondary | ICD-10-CM

## 2020-09-11 LAB — BASIC METABOLIC PANEL
Anion gap: 7 (ref 5–15)
BUN: 30 mg/dL — ABNORMAL HIGH (ref 8–23)
CO2: 33 mmol/L — ABNORMAL HIGH (ref 22–32)
Calcium: 8.5 mg/dL — ABNORMAL LOW (ref 8.9–10.3)
Chloride: 97 mmol/L — ABNORMAL LOW (ref 98–111)
Creatinine, Ser: 0.81 mg/dL (ref 0.44–1.00)
GFR, Estimated: >60 mL/min (ref >60)
Glucose, Bld: 168 mg/dL — ABNORMAL HIGH (ref 70–99)
Potassium: 4.4 mmol/L (ref 3.5–5.1)
Sodium: 137 mmol/L (ref 135–145)

## 2020-09-11 LAB — RESP PANEL BY RT-PCR (FLU A&B, COVID) ARPGX2
Influenza A by PCR: NEGATIVE
Influenza B by PCR: NEGATIVE
SARS Coronavirus 2 by RT PCR: NEGATIVE

## 2020-09-11 LAB — MAGNESIUM: Magnesium: 1.7 mg/dL (ref 1.7–2.4)

## 2020-09-11 LAB — D-DIMER, QUANTITATIVE: D-Dimer, Quant: 1.51 ug/mL-FEU — ABNORMAL HIGH (ref 0.00–0.50)

## 2020-09-11 LAB — GLUCOSE, CAPILLARY
Glucose-Capillary: 154 mg/dL — ABNORMAL HIGH (ref 70–99)
Glucose-Capillary: 165 mg/dL — ABNORMAL HIGH (ref 70–99)
Glucose-Capillary: 154 mg/dL — ABNORMAL HIGH (ref 70–99)
Glucose-Capillary: 177 mg/dL — ABNORMAL HIGH (ref 70–99)

## 2020-09-11 MED ORDER — POLYETHYLENE GLYCOL 3350 17 G PO PACK: 17.0000 g | PACK | Freq: Every day | ORAL | 0 refills | Status: DC | PRN

## 2020-09-11 MED ORDER — METHOCARBAMOL 500 MG PO TABS: 500.0000 mg | ORAL_TABLET | Freq: Four times a day (QID) | ORAL | 0 refills | Status: DC | PRN

## 2020-09-11 MED ORDER — ALPRAZOLAM 0.25 MG PO TABS
0.2500 mg | ORAL_TABLET | Freq: Every day | ORAL | Status: DC
  Administered 2020-09-11 – 2020-09-13 (×3): 0.25 mg via ORAL
  Filled 2020-09-11 (×4): qty 1

## 2020-09-11 MED ORDER — ALUM & MAG HYDROXIDE-SIMETH 200-200-20 MG/5ML PO SUSP: 30.0000 mL | ORAL | 0 refills | Status: DC | PRN

## 2020-09-11 MED ORDER — ALPRAZOLAM 1 MG PO TABS: 0.5000 mg | ORAL_TABLET | Freq: Every evening | ORAL | 0 refills | Status: DC | PRN

## 2020-09-11 MED ORDER — FUROSEMIDE 40 MG PO TABS: 40.0000 mg | ORAL_TABLET | Freq: Two times a day (BID) | ORAL | 0 refills | Status: DC

## 2020-09-11 MED ORDER — OXYCODONE HCL 5 MG PO TABS
5.0000 mg | ORAL_TABLET | ORAL | Status: DC | PRN
  Administered 2020-09-12 – 2020-09-14 (×6): 5 mg via ORAL
  Filled 2020-09-11 (×7): qty 1

## 2020-09-11 MED ORDER — ONDANSETRON HCL 4 MG PO TABS: 4.0000 mg | ORAL_TABLET | Freq: Four times a day (QID) | ORAL | 0 refills | Status: AC | PRN

## 2020-09-11 MED ORDER — SENNA 8.6 MG PO TABS
1.0000 | ORAL_TABLET | Freq: Every day | ORAL | Status: DC
  Administered 2020-09-12 – 2020-09-14 (×3): 8.6 mg via ORAL
  Filled 2020-09-11 (×3): qty 1

## 2020-09-11 MED ORDER — SENNA 8.6 MG PO TABS: 1.0000 | ORAL_TABLET | Freq: Every day | ORAL | 0 refills | Status: DC

## 2020-09-11 NOTE — Progress Notes (Signed)
PT Cancellation Note  Patient Details Name: [name] MRN: [id number] DOB: 24-Feb-1928   Cancelled Treatment:    Reason Eval/Treat Not Completed: Pain limiting ability to participate. Pt almost in a panic on arrival to room as she has requested pain meds several times and reports her pain is an 8/10. Utilized breathing techniques to calm pt and assured pt that her HR and O2 was WNL. RN notified and on the way. Will check back as time allows.    Allena Katz 09/11/2020, 12:02 PM

## 2020-09-11 NOTE — TOC Progression Note (Addendum)
Transition of Care [facility]) - Progression Note    Patient Details  Name: [name] MRN: [id number] Date of Birth: 03-18-1928  [facility] of [facility]) CM/SW Contact  Milinda Antis, [place] Phone Number: 09/11/2020, 11:12 AM  Clinical Narrative:    CSW received a call from the patient's granddaughter, [name].  The facility of choice is [facility].  Ms. Stann Mainland asked that CSW confirm with the patient.  CSW met with patient at beside.  The patient was alert, oriented, and in a [place] distress during encounter.  The patient confirmed speaking with the family and willingness to go to [place].    CSW called Perrin Smack, Engineer, site with [facility].  The facility is willing to accept the patient.  [facility] does not accept patient's over the weekend.  Pending: COVID test  15:06-  Patient's COVID test came back negative, but per MD patient is now not medically ready to d/c.  The facility and family have been notified.    Expected Discharge Plan: [place] Barriers to Discharge: Continued Medical Work up  Expected Discharge Plan and Services Expected Discharge Plan: [place] arrangements for the past 2 months: Single Family Home                                       Social Determinants of Health (SDOH) Interventions    Readmission Risk Interventions Readmission Risk Prevention Plan 09/04/2019  Transportation Screening Complete  PCP or Specialist Appt within 3-5 Days Complete  HRI or Home Care Consult Complete  Palliative Care Screening Not Applicable  Medication Review (RN Care Manager) Complete  Some recent data might be hidden

## 2020-09-11 NOTE — Progress Notes (Signed)
Family Medicine Teaching Service Daily Progress Note Intern Pager: [phone]  Patient name: [name] Medical record number: [id number] Date of birth: 23-Dec-1927 Age: 87 y.o. Gender: female  Primary Care Provider: Ria Bush, MD Consultants: Ortho Code Status: Full  Pt Overview and Major Events to Date:  6/4 - Admitted 6/6 - Right LE intramedullary nail surgery  Assessment and Plan: [name] is a 87 y.o. female presenting with right hip fracture, no s/p intramedullary nail. PMH is significant for paroxsmal afib on eliquis, h/o provoked DVT, T2DM, HLD, HFpEF (EF 50-55%), depression/anxiety, GERD, CAD, CKD III, h/o skin cancer, chronic pain 2/2 spinal stenosis  Right femoral neck fracture s/p intramedullary nail Post-Op day 4.  Ortho indicates no further imaging or management necessary for possible pelvic fracture on X-ray. - Continue PT/OT    Hypoxia Patient weaned to RA yesterday but had desaturation to 88% in early AM.  Thought likely chronic issue, has been on supplemental O2 since arrival to ED.  Has not utilized incentive spirometer.  Wells score of 1.5.  Low threshold for CTA given recent surgery.   - Closely monitor O2 sats - Ambulate patient with Pulse Ox  Abdominal Pain Non-tender to palpation.  Bowel sounds hyperactive.  On Senna BID, Miralax daily PRN. - Decrease Senna to qd   FEN/GI: Full Diet PPx: Eliquis   Status is: Inpatient  Remains inpatient appropriate because:Unsafe d/c plan  Dispo: The patient is from: Home              Anticipated d/c is to: SNF              Patient currently is not medically stable to d/c.   Difficult to place patient No        Subjective:  Patient indicates constipation has resolved but having abdominal pain.  Objective: Temp:  [97.3 F (36.3 C)-98.5 F (36.9 C)] 97.3 F (36.3 C) (06/10 0746) Pulse Rate:  [46-98] 98 (06/10 0800) Resp:  [16-18] 18 (06/10 0800) BP: (116-130)/(58-77) 122/64 (06/10  0746) SpO2:  [88 %-99 %] 96 % (06/10 0800) Physical Exam:   Physical Exam Constitutional:      Appearance: Normal appearance.  HENT:     Head: Normocephalic and atraumatic.  Cardiovascular:     Rate and Rhythm: Normal rate and regular rhythm.     Pulses: Normal pulses.  Pulmonary:     Effort: Pulmonary effort is normal.     Breath sounds: Normal breath sounds.  Abdominal:     General: Abdomen is flat.     Palpations: Abdomen is soft.     Tenderness: There is no abdominal tenderness.  Skin:    General: Skin is warm.  Neurological:     Mental Status: She is alert and oriented to person, place, and time.   Laboratory: Recent Labs  Lab 09/08/20 0248 09/09/20 0314 09/10/20 0147  WBC 12.1* 13.7* 10.2  HGB 13.5 10.8* 10.4*  HCT 43.5 34.4* 33.6*  PLT 144* 151 180   Recent Labs  Lab 09/08/20 0248 09/09/20 0314 09/11/20 0606  NA 136 134* 137  K 4.3 4.4 4.4  CL 98 100 97*  CO2 27 26 33*  BUN 32* 40* 30*  CREATININE 1.16* 1.06* 0.81  CALCIUM 9.2 8.4* 8.5*  GLUCOSE 233* 174* 168*     Imaging/Diagnostic Tests: No new imaging  Delora Fuel, MD 09/11/2020, 8:14 AM PGY-1, [place] Intern pager: [phone], text pages welcome

## 2020-09-11 NOTE — Care Management Important Message (Signed)
Important Message  Patient Details  Name: [name] MRN: [id number] Date of Birth: 09-Apr-1927   Medicare Important Message Given:  Yes - Important Message mailed due to current National Emergency  Verbal consent obtained due to current National Emergency  Relationship to patient: Self Contact Name: Sila Call Date: 09/11/20  Time: 1048 Phone: [phone] Outcome: No Answer/Busy Important Message mailed to: Patient address on file    Delorse Lek 09/11/2020, 10:48 AM

## 2020-09-11 NOTE — Progress Notes (Addendum)
She denies any chest pain or SOB. She does feel like she is seeing things which is unusual for her, e.g., TV on the floor.   Otherwise, no acute findings on her exam.  A/P: S/P ORIF for right femoral fracture: Stable The incision site looks clean without signs of infection. PT/OT to follow.  PAF : Stable on Eliquis  New O2 requirement: Has been since this admission Well score for this lady is 3-6, i.e., moderate risk, i.e, PE is the #1 diagnosis OR equally likely = 3; Immobilization at least 3 days OR surgery in the previous 4 weeks = 1.5 ; Previous, objectively diagnosed PE or DVT = 1.5 Please obtain D-dimer if we are unable to wean off O2 since this is a new requirement since this admission. She also has CHF, and we can consider repeating a chest X-ray if she continues to require O2. Nurse should keep trying to wean her off O2.

## 2020-09-11 NOTE — Progress Notes (Addendum)
Family Medicine Teaching Service Daily Progress Note Intern Pager: [phone]  Patient name: [name] Medical record number: [id number] Date of birth: October 22, 1927 Age: 87 y.o. Gender: female  Primary Care Provider: Ria Bush, MD Consultants: Ortho Code Status: Full  Pt Overview and Major Events to Date:  06/04: Admitted to [facility] 06/06: Right ORIF  Assessment and Plan: [name] is a 87 y.o. female presenting with right hip fracture, no s/p intramedullary nail. PMH is significant for paroxsmal afib on eliquis, h/o provoked DVT, T2DM, HLD, HFpEF (EF 50-55%), depression/anxiety, GERD, CAD, CKD III, h/o skin cancer, chronic pain 2/2 spinal stenosis   Right femoral neck fracture s/p intramedullary nail POD#5 ORIF.  Required Norco 5 mg for pain overnight.  Has not been out of bed secondary to pain. Incision site covered with bandage and no drainage nnoted.  Distal pulses present and without lower extremity edema -Continue Norco 5 mg q6 h prn -Continue Tylenol 650 mg q6h scheduled -PT/OT -OOB daily -Weight bearing as tolerated -Ortho following, appreciate recommendations.  Hypoxia Continues to require supplemental oxygen.  Trial of weaning overnight but saturations dropped to high 80's on room air.  Denies any chest pain, worsening shortness of breath or cough.  Wells score 3 and  D. Dimer yesterday 1.15, Age adjusted 0.91 making PE probable.  Currently on Eliquis for AFib -CTA chest stat negative for PE -Continue to monitor oxygen saturations and maintain >88% -Incentive Spirometer q2h while awake -Flutter valve q2h while awake   Paroxysmal atrial fibrillation Stable. Rate controlled.  On Eliquis, Metoprolol and Cardizem.  No chest pain. -Continue Home medications   Type 2 DM Stable. Home medications Januvia 50 mg daily and Pioglitazone 15mg . -Continue to hold home medications -Continue sSSI and monitor CBG   HTN Stable.  Normotensive -continue home medications  Metoprolol and Cardizem  Depression/GAD  -Continue home Zoloft  -Continue to decrease Xanax 0.25mg    HRpEF  CAD s/p CABG 1996 -Continue home medications -Daily weight -Intake and output   FEN/GI: Heart healthy PPx: Eliquis   Status is: Inpatient  Remains inpatient appropriate because: waiting SNF placement  Dispo: The patient is from: Home              Anticipated d/c is to: SNF              Patient currently is medically stable to d/c.   Difficult to place patient No   Subjective:  Continues to remain on supplemental oxygen 2L.  RN tried to wean but saturations dropped to mid 80's.  Denies any chest pain or shortness of breath.    Objective: Temp:  [97.3 F (36.3 C)-99.3 F (37.4 C)] 99.3 F (37.4 C) (06/10 2055) Pulse Rate:  [46-98] 67 (06/10 2055) Resp:  [15-18] 15 (06/10 2055) BP: (106-130)/(58-65) 106/65 (06/10 2055) SpO2:  [88 %-98 %] 93 % (06/10 2055)  Physical Exam:  General: 87 y.o. female in NAD Cardio: RRR no m/r/g Lungs: CTAB, no wheezing, no rhonchi, no crackles, no IWOB on O2 1-2 L Abdomen: Soft, non-tender to palpation, non-distended, positive bowel sounds Skin: warm and dry Extremities: No edema, right hip incision dressing dry and intact.  Laboratory: Recent Labs  Lab 09/08/20 0248 09/09/20 0314 09/10/20 0147  WBC 12.1* 13.7* 10.2  HGB 13.5 10.8* 10.4*  HCT 43.5 34.4* 33.6*  PLT 144* 151 180   Recent Labs  Lab 09/08/20 0248 09/09/20 0314 09/11/20 0606  NA 136 134* 137  K 4.3 4.4 4.4  CL 98  100 97*  CO2 27 26 33*  BUN 32* 40* 30*  CREATININE 1.16* 1.06* 0.81  CALCIUM 9.2 8.4* 8.5*  GLUCOSE 233* 174* 168*     Imaging/Diagnostic Tests: CT Angio Chest Pulmonary Embolism (PE) W or WO Contrast  Result Date: 09/12/2020 CLINICAL DATA:  Elevated D-dimer. Recent hip repair. Pulmonary embolism suspected EXAM: CT ANGIOGRAPHY CHEST WITH CONTRAST TECHNIQUE: Multidetector CT imaging of the chest was performed using the standard protocol  during bolus administration of intravenous contrast. Multiplanar CT image reconstructions and MIPs were obtained to evaluate the vascular anatomy. CONTRAST:  36mL OMNIPAQUE IOHEXOL 350 MG/ML SOLN COMPARISON:  07/16/2019 FINDINGS: Cardiovascular: Satisfactory opacification of the pulmonary arteries to the segmental level. No evidence of pulmonary embolism. Enlarged heart size. No pericardial effusion. Aortic and coronary atherosclerosis. There has been CABG. No systemic arterial opacification on this PE study. Mediastinum/Nodes: Negative for adenopathy or mass. Lungs/Pleura: Minimal dependent atelectasis and subpleural reticulation. Trace bilateral pleural effusion. There is no edema, consolidation, or pneumothorax. Upper Abdomen: Atheromatous calcification. Musculoskeletal: Degenerative disc narrowing and endplate spurring in the thoracic spine. Advanced glenohumeral osteoarthritis on the right. Review of the MIP images confirms the above findings. IMPRESSION: 1. Negative for pulmonary embolism. 2. Trace pleural fluid. 3. Aortic Atherosclerosis (ICD10-I70.0). Electronically Signed   By: Monte Fantasia M.D.   On: 09/12/2020 06:59   DG CHEST PORT 1 VIEW  Result Date: 09/11/2020 CLINICAL DATA:  Oxygen desaturation EXAM: PORTABLE CHEST 1 VIEW COMPARISON:  September 09, 2020 FINDINGS: There is mild left base atelectasis. There is no edema or airspace opacity. There is cardiomegaly with pulmonary vascularity within normal limits. There is aortic atherosclerosis. Patient is status post coronary artery bypass grafting. No adenopathy. No appreciable regions. IMPRESSION: Left base atelectasis. No edema or airspace opacity. Cardiomegaly with postoperative changes. Aortic Atherosclerosis (ICD10-I70.0). Electronically Signed   By: Lowella Grip III M.D.   On: 09/11/2020 16:17     Carollee Leitz, MD 09/11/2020, 11:11 PM PGY-2, [place] Intern pager: [phone], text pages welcome

## 2020-09-11 NOTE — Progress Notes (Signed)
FPTS Interim Progress Note  Patient sleeping and resting comfortably.  Rounded with primary RN who reports that day team had documented a decrease in saturation to 86% at rest.  Continues to remain on 1 L n/c.  Has has intermittent episodes of bradycardia but asymptomatic.  No concerns voiced.  No orders required.  Appreciated nightly round.  Today's Vitals   09/10/20 1756 09/10/20 2004 09/10/20 2355 09/11/20 0157  BP:  119/77 128/64   Pulse: 76 70 (!) 46 68  Resp:  16 16   Temp:  98.5 F (36.9 C) 98.4 F (36.9 C)   TempSrc:  Oral Oral   SpO2:  94% 94%   Weight:      Height:      PainSc:  3       Bradycardia, asymptomatic Consider decrease AVN Consider decrease sedating mediations  New oxygen requirement, likely post surgical atelectasis Low risk Wells Score 1.5 Encourage Incentive spirometer OOB daily   Carollee Leitz, MD 09/11/2020, 4:36 AM PGY-2, [place] Service pager [phone]

## 2020-09-11 NOTE — Discharge Summary (Addendum)
[facility] Discharge Summary  Patient name: [name] Medical record number: [id number] Date of birth: 08-10-1927 Age: 87 y.o. Gender: female Date of Admission: 09/05/2020  Date of Discharge: 09/14/20 Admitting Physician: Patriciaann Clan, DO  Primary Care Provider: Ria Bush, MD Consultants: Ortho  Indication for Hospitalization: Femoral Fracture  Discharge Diagnoses/Problem List:  T2DM, HLD, HFpEF (EF 50-55%), depression/anxiety, GERD, CAD, CKD III, h/o skin cancer, chronic pain 2/2 spinal stenosis,  right femoral neck fracture s/p intramedullary nail  Disposition: Able to be discharged safely to SNF  Discharge Condition: Stable  Discharge Exam:   Physical Exam Constitutional:      Appearance: Normal appearance.  HENT:     Head: Normocephalic and atraumatic.  Cardiovascular:     Rate and Rhythm: Normal rate and regular rhythm.     Pulses: Normal pulses.  Pulmonary:     Effort: Pulmonary effort is normal.     Breath sounds: Normal breath sounds.  Abdominal:     General: Abdomen is flat.     Palpations: Abdomen is soft.     Tenderness: There is no abdominal tenderness.  Skin:    General: Skin is warm.  Neurological:     Mental Status: She is alert and oriented to person, place, and time.     Brief Hospital Course:  [name] is a 87 y.o. female presenting with right hip fracture after mechanical fall at home. PMH is significant for paroxsmal afib on eliquis, h/o provoked DVT, T2DM, HLD, HFpEF (EF 50-55%), depression/anxiety, GERD, CAD, CKD III, h/o skin cancer, chronic pain 2/2 spinal stenosis  Right femoral neck fracture s/p ORIF Patient admitted with right femoral neck fracture after mechanical fall. Orthopedics was consulted and patient underwent ORIF with intramedullary nail on 09/07/20.  Eliquis was initially held for surgery, and was resumed on 6/7.  PT/OT saw patient afterward and recommended SNF placement.     Paroxysmal atrial fibrillation Patient's Eliquis was held on admission due to need for surgery, was restarted on 6/7.  Home medications of metoprolol 50 mg twice daily and diltiazem 120 mg daily were continued.  Abnormal UA with concern for UTI Patient had abnormal urinalysis and elevated WBC, patient afebrile during hospitalization. Urine culture was not able to be collected before patient was given antibiotics for surgical prophylaxis and patient received 3 doses of cephazolin total followed by Keflex to finish out 5 days of treatment due to clinical concern for UTI.  WBC normalized.   Type 2 DM Patient home medications of Januvia and Pioglitazone were held and she was placed on sliding scale insulin while hospitalized.  Hypoxia Patient placed on O2 on admission.  This thought to be new requirement.  No changes on Chest X-Ray. Was weaned but desatted to 88%.  Patient did not have chest pain or difficulty brerathing.  Obtained CTA which was negative for PE.  Determined new chronic requirement.  Remained stable on 2L at discharge.  Can have ECHO outpatient.   All other chronic conditions were stable during hospitalization.    Issues for Follow Up:  Consider bisphosphonate therapy after recovery for osteoporosis. Monitor O2 requirement.  Continue to encourage incentive spirometer and continued ambulation of PT. Per Ortho team, no need for further imaging or management of possible pelvic fracture seen on X-Ray. Appt with Dr Cathie Olden office, [place] - [facility], on 10/02/20 from 3:15 PM for ECHO.  Significant Procedures: Right Femoral Intramedullary nail placement on 6/6//22  Significant Labs and Imaging:  Recent Labs  Lab 09/09/20 0314 09/10/20 0147 09/13/20 0540  WBC 13.7* 10.2 9.7  HGB 10.8* 10.4* 10.8*  HCT 34.4* 33.6* 33.7*  PLT 151 180 228   Recent Labs  Lab 09/08/20 0248 09/09/20 0314 09/11/20 0606 09/13/20 0540  NA 136 134* 137 137  K 4.3 4.4 4.4 3.9  CL 98 100 97* 93*   CO2 27 26 33* 34*  GLUCOSE 233* 174* 168* 157*  BUN 32* 40* 30* 30*  CREATININE 1.16* 1.06* 0.81 0.87  CALCIUM 9.2 8.4* 8.5* 8.8*  MG  --   --  1.7  --      Results/Tests Pending at Time of Discharge: None  Discharge Medications:  Allergies as of 09/14/2020       Reactions   Metformin And Related Diarrhea        Medication List     STOP taking these medications    loperamide 2 MG capsule Commonly known as: IMODIUM       TAKE these medications    Align 4 MG Caps Take 1 capsule (4 mg total) by mouth daily.   ALPRAZolam 1 MG tablet Commonly known as: XANAX Take 0.5 tablets (0.5 mg total) by mouth at bedtime as needed for anxiety. What changed: See the new instructions.   alum & mag hydroxide-simeth 200-200-20 MG/5ML suspension Commonly known as: MAALOX/MYLANTA Take 30 mLs by mouth every 4 (four) hours as needed for indigestion.   B COMPLEX-VITAMIN C PO Take 1 tablet by mouth daily. +zinc   diclofenac sodium 1 % Gel Commonly known as: VOLTAREN Apply 1 application topically 3 (three) times daily.   Dilt-XR 120 MG 24 hr capsule Generic drug: diltiazem TAKE 1 CAPSULE BY MOUTH EVERY DAY What changed: how much to take   Eliquis 5 MG Tabs tablet Generic drug: apixaban TAKE 1 TABLET BY MOUTH TWICE A DAY What changed: how much to take   famotidine 20 MG tablet Commonly known as: Pepcid Take 1 tablet (20 mg total) by mouth daily as needed for heartburn (breakthrough heartburn).   fluticasone 50 MCG/ACT nasal spray Commonly known as: FLONASE Place 1 spray into both nostrils daily as needed for allergies.   furosemide 40 MG tablet Commonly known as: LASIX Take 1 tablet (40 mg total) by mouth 2 (two) times daily.   glucosamine-chondroitin 500-400 MG tablet Take 1 tablet by mouth daily.   HYDROcodone-acetaminophen 5-325 MG tablet Commonly known as: Norco Take 1 tablet by mouth every 6 (six) hours as needed for moderate pain. What changed: You were  already taking a medication with the same name, and this prescription was added. Make sure you understand how and when to take each.   HYDROcodone-acetaminophen 5-325 MG tablet Commonly known as: NORCO/VICODIN Take 1 tablet by mouth every 6 (six) hours as needed for moderate pain or severe pain. What changed:  how much to take when to take this   ICaps Caps Take 1 capsule by mouth daily.   loratadine 10 MG tablet Commonly known as: CLARITIN Take 10 mg by mouth daily as needed for allergies.   methocarbamol 500 MG tablet Commonly known as: ROBAXIN Take 1 tablet (500 mg total) by mouth every 6 (six) hours as needed for muscle spasms.   metoprolol tartrate 50 MG tablet Commonly known as: LOPRESSOR TAKE 1 TABLET BY MOUTH TWICE A DAY   nitroGLYCERIN 0.4 MG SL tablet Commonly known as: NITROSTAT Place 1 tablet (0.4 mg total) under the tongue every 5 (five) minutes as needed for  chest pain.   nystatin ointment Commonly known as: MYCOSTATIN APPLY TO AFFECTED AREA TWICE DAILY What changed: See the new instructions.   ondansetron 4 MG tablet Commonly known as: ZOFRAN Take 1 tablet (4 mg total) by mouth every 6 (six) hours as needed for nausea.   OneTouch Delica Plus [id number] Misc Use to test blood sugar once daily dx code E11.9 What changed:  how much to take how to take this when to take this   OneTouch Verio test strip Generic drug: glucose blood USE TO TEST BLOOD SUGAR ONCE DAILY. DX:E11.9 What changed: See the new instructions.   pantoprazole 40 MG tablet Commonly known as: PROTONIX TAKE 1 TABLET BY MOUTH TWICE A DAY   pioglitazone 15 MG tablet Commonly known as: ACTOS TAKE 1 TABLET BY MOUTH EVERY DAY   polyethylene glycol 17 g packet Commonly known as: MIRALAX / GLYCOLAX Take 17 g by mouth daily as needed for mild constipation.   potassium chloride 10 MEQ CR capsule Commonly known as: MICRO-K TAKE ONE CAPSULE BY MOUTH ONCE DAILY   pravastatin 20 MG  tablet Commonly known as: PRAVACHOL Take 1 tablet (20 mg total) by mouth daily.   senna 8.6 MG Tabs tablet Commonly known as: SENOKOT Take 1 tablet (8.6 mg total) by mouth daily.   sertraline 50 MG tablet Commonly known as: ZOLOFT Take 1 tablet (50 mg total) by mouth daily.   sitaGLIPtin 100 MG tablet Commonly known as: Januvia TAKE 1/2 TABLET BY MOUTH EVERY DAY What changed:  how much to take how to take this when to take this additional instructions   vitamin E 1000 UNIT capsule Take 1,000 Units by mouth daily.               Discharge Care Instructions  (From admission, onward)           Start     Ordered   09/07/20 0000  Weight bearing as tolerated        09/07/20 1423            Discharge Instructions: Please refer to Patient Instructions section of EMR for full details.  Patient was counseled important signs and symptoms that should prompt return to medical care, changes in medications, dietary instructions, activity restrictions, and follow up appointments.   Follow-Up Appointments:  Contact information for follow-up providers     Leandrew Koyanagi, MD In 2 weeks.   Specialty: Orthopedic Surgery Why: For suture removal, For wound re-check Contact information: [place] [address] [phone]              Contact information for after-discharge care     Destination     [facility] Preferred SNF .   Service: Skilled Nursing Contact information: [address] [place] [phone]                     Delora Fuel, MD 09/14/2020, 1:53 PM PGY-1, [place]

## 2020-09-11 NOTE — Plan of Care (Signed)
Rapid COVID test sent off to lab @ 1115. Patient still requiring 2L of oxygen. Patient dressings changes x2 [place] drainage from prior dressing.    Problem: Activity: Goal: Ability to avoid complications of mobility impairment will improve Outcome: Progressing   Problem: Pain Management: Goal: Pain level will decrease Outcome: Progressing   Problem: Skin Integrity: Goal: Signs of wound healing will improve Outcome: Progressing   Problem: Tissue Perfusion: Goal: Ability to maintain adequate tissue perfusion will improve Outcome: Progressing

## 2020-09-12 ENCOUNTER — Inpatient Hospital Stay (HOSPITAL_COMMUNITY): Payer: Medicare Other

## 2020-09-12 LAB — GLUCOSE, CAPILLARY
Glucose-Capillary: 277 mg/dL — ABNORMAL HIGH (ref 70–99)
Glucose-Capillary: 169 mg/dL — ABNORMAL HIGH (ref 70–99)
Glucose-Capillary: 152 mg/dL — ABNORMAL HIGH (ref 70–99)
Glucose-Capillary: 168 mg/dL — ABNORMAL HIGH (ref 70–99)

## 2020-09-12 MED ORDER — IOHEXOL 350 MG/ML SOLN
50.0000 mL | Freq: Once | INTRAVENOUS | Status: AC | PRN
  Administered 2020-09-12: 50 mL via INTRAVENOUS

## 2020-09-12 NOTE — Progress Notes (Signed)
   09/12/20 0849  Assess: MEWS Score  Temp 97.8 F (36.6 C)  BP 111/75  Pulse Rate (!) 131  Resp 20  Level of Consciousness Alert  SpO2 97 %  O2 Device Nasal Cannula  O2 Flow Rate (L/min) 2 L/min  Assess: MEWS Score  MEWS Temp 0  MEWS Systolic 0  MEWS Pulse 3  MEWS RR 0  MEWS LOC 0  MEWS Score 3  MEWS Score Color Yellow  Assess: if the MEWS score is Yellow or Red  Were vital signs taken at a resting state? Yes  Focused Assessment No change from prior assessment  Early Detection of Sepsis Score *See Row Information* Medium  MEWS guidelines implemented *See Row Information* No, previously yellow, continue vital signs every 4 hours  Treat  MEWS Interventions Escalated (See documentation below)  Pain Scale 0-10  Pain Score Asleep  Take Vital Signs  Increase Vital Sign Frequency  Yellow: Q 2hr X 2 then Q 4hr X 2, if remains yellow, continue Q 4hrs  Escalate  MEWS: Escalate Yellow: discuss with charge nurse/RN and consider discussing with provider and RRT  Notify: Charge Nurse/RN  Name of Charge Nurse/RN Notified Zenaida RN  Date Charge Nurse/RN Notified 09/12/20  Time Charge Nurse/RN Notified 0859  Notify: Provider  Provider Name/Title Dr Letitia Libra  Date Provider Notified 09/12/20  Time Provider Notified [phone]  Notification Type Page  Notification Reason Change in status  Provider response Other (Comment) (recheck vitals and will see during rounds)  Date of Provider Response 09/12/20  Time of Provider Response [phone]  Document  Patient Outcome Other (Comment) (will continue to monitor)  Progress note created (see row info) Yes  Attending will come to evaluate. Meanwhile vitals per q2h x2 then q4  per mews protocol

## 2020-09-12 NOTE — Progress Notes (Signed)
SATURATION QUALIFICATIONS: (This note is used to comply with regulatory documentation for home oxygen)  Patient Saturations on Room Air at Rest = 87%  Patient Saturations on Room Air while Ambulating  = unable to ambulate.Can only stand and pivot.  Patient Saturations on 2 Liters of oxygen while at rest = 94%

## 2020-09-13 ENCOUNTER — Encounter: Payer: Self-pay | Admitting: Internal Medicine

## 2020-09-13 DIAGNOSIS — D62 Acute posthemorrhagic anemia: Secondary | ICD-10-CM | POA: Insufficient documentation

## 2020-09-13 LAB — GLUCOSE, CAPILLARY
Glucose-Capillary: 152 mg/dL — ABNORMAL HIGH (ref 70–99)
Glucose-Capillary: 159 mg/dL — ABNORMAL HIGH (ref 70–99)
Glucose-Capillary: 168 mg/dL — ABNORMAL HIGH (ref 70–99)
Glucose-Capillary: 172 mg/dL — ABNORMAL HIGH (ref 70–99)

## 2020-09-13 LAB — CBC WITH DIFFERENTIAL/PLATELET
Abs Immature Granulocytes: 0.16 10*3/uL — ABNORMAL HIGH (ref 0.00–0.07)
Basophils Absolute: 0.1 10*3/uL (ref 0.0–0.1)
Basophils Relative: 1 %
Eosinophils Absolute: 0.5 10*3/uL (ref 0.0–0.5)
Eosinophils Relative: 5 %
HCT: 33.7 % — ABNORMAL LOW (ref 36.0–46.0)
Hemoglobin: 10.8 g/dL — ABNORMAL LOW (ref 12.0–15.0)
Immature Granulocytes: 2 %
Lymphocytes Relative: 12 %
Lymphs Abs: 1.2 10*3/uL (ref 0.7–4.0)
MCH: 29.2 pg (ref 26.0–34.0)
MCHC: 32 g/dL (ref 30.0–36.0)
MCV: 91.1 fL (ref 80.0–100.0)
Monocytes Absolute: 1 10*3/uL (ref 0.1–1.0)
Monocytes Relative: 10 %
Neutro Abs: 6.9 10*3/uL (ref 1.7–7.7)
Neutrophils Relative %: 70 %
Platelets: 228 10*3/uL (ref 150–400)
RBC: 3.7 MIL/uL — ABNORMAL LOW (ref 3.87–5.11)
RDW: 15.5 % (ref 11.5–15.5)
WBC: 9.7 10*3/uL (ref 4.0–10.5)
nRBC: 0 % (ref 0.0–0.2)

## 2020-09-13 LAB — BASIC METABOLIC PANEL
Anion gap: 10 (ref 5–15)
BUN: 30 mg/dL — ABNORMAL HIGH (ref 8–23)
CO2: 34 mmol/L — ABNORMAL HIGH (ref 22–32)
Calcium: 8.8 mg/dL — ABNORMAL LOW (ref 8.9–10.3)
Chloride: 93 mmol/L — ABNORMAL LOW (ref 98–111)
Creatinine, Ser: 0.87 mg/dL (ref 0.44–1.00)
GFR, Estimated: >60 mL/min (ref >60)
Glucose, Bld: 157 mg/dL — ABNORMAL HIGH (ref 70–99)
Potassium: 3.9 mmol/L (ref 3.5–5.1)
Sodium: 137 mmol/L (ref 135–145)

## 2020-09-13 NOTE — Progress Notes (Addendum)
Family Medicine Teaching Service Daily Progress Note Intern Pager: [phone]  Patient name: [name] Medical record number: [id number] Date of birth: 09-Jun-1927 Age: 87 y.o. Gender: female  Primary Care Provider: Ria Bush, MD Consultants: Ortho Code Status: Full  Pt Overview and Major Events to Date:  6/4 Admitted 6/6- RLE intramedullary nail sx  Assessment and Plan: [name] is a 87 y.o. female presenting with right hip fracture, no s/p intramedullary nail. PMH is significant for paroxsmal afib on eliquis, h/o provoked DVT, T2DM, HLD, HFpEF (EF 50-55%), depression/anxiety, GERD, CAD, CKD III, h/o skin cancer, chronic pain 2/2 spinal stenosis   Right femoral neck fracture s/p intramedullary nail POD#6 ORIF. VSS overnight. Hgb stable. -Continue current pain managment -PT/OT -OOB daily -Weight bearing as tolerated   Hypoxia Continued on 2L [place]. Attempted to wean while in room desat to 80s on RA and 1L.  -Ambulate w/ pulse ox w/ PT  Abdominal Pain Diffuse abdominal discomfort. NABS. No concern for acute abdomen at this time. Recent bowel movement yesterday.  -Continue to monitor -Consider holding miralax, senna if having diarrhea   FEN/GI: Heart healthy PPx: Eliquis   Status is: Inpatient  Remains inpatient appropriate because:Unsafe d/c plan and will go to [facility] 6/13.  Dispo: The patient is from: Home              Anticipated d/c is to: SNF              Patient currently is medically stable to d/c.   Difficult to place patient No   Subjective:  Having abdominal pain. Last bowel movement yesterday and messed up the bed. Otherwise she is doing ok.   Objective: Temp:  [97.8 F (36.6 C)-98.4 F (36.9 C)] 98 F (36.7 C) (06/11 1314) Pulse Rate:  [57-131] 67 (06/11 2136) Resp:  [16-20] 16 (06/11 2136) BP: (106-119)/(55-83) 119/62 (06/11 2136) SpO2:  [94 %-98 %] 96 % (06/11 2136)  Physical Exam: General: Appears well but sleepy, no acute distress.  Age appropriate. Cardiac: Irregular rhythm regular rate, no murmurs Respiratory: CTAB, normal effort Abdomen: soft, does not appear to be tender with palpation, nondistended, NABS Extremities: No edema or cyanosis. Right hip surgical site without clean dressing and ice pack in place.  Skin: Warm and dry, no rashes noted Neuro: alert and oriented to self   Laboratory: Recent Labs  Lab 09/09/20 0314 09/10/20 0147 09/13/20 0540  WBC 13.7* 10.2 9.7  HGB 10.8* 10.4* 10.8*  HCT 34.4* 33.6* 33.7*  PLT 151 180 228   Recent Labs  Lab 09/09/20 0314 09/11/20 0606 09/13/20 0540  NA 134* 137 137  K 4.4 4.4 3.9  CL 100 97* 93*  CO2 26 33* 34*  BUN 40* 30* 30*  CREATININE 1.06* 0.81 0.87  CALCIUM 8.4* 8.5* 8.8*  GLUCOSE 174* 168* 157*    Imaging/Diagnostic Tests: No new imaging.   Gerlene Fee, DO 09/13/2020, 7:33 AM PGY-2, [place] Intern pager: [phone], text pages welcome

## 2020-09-13 NOTE — Plan of Care (Signed)
  Problem: Education: Goal: Verbalization of understanding the information provided will improve Outcome: Progressing   Problem: Coping: Goal: Level of anxiety will decrease Outcome: Progressing   Problem: Physical Regulation: Goal: Postoperative complications will be avoided or minimized Outcome: Progressing   Problem: Respiratory: Goal: Ability to maintain a clear airway will improve Outcome: Progressing   Problem: Pain Management: Goal: Pain level will decrease Outcome: Progressing   Problem: Skin Integrity: Goal: Signs of wound healing will improve Outcome: Progressing

## 2020-09-14 ENCOUNTER — Telehealth: Payer: Self-pay | Admitting: Cardiovascular Disease

## 2020-09-14 ENCOUNTER — Inpatient Hospital Stay (HOSPITAL_COMMUNITY): Payer: Medicare Other

## 2020-09-14 ENCOUNTER — Encounter: Payer: Self-pay | Admitting: Dermatology

## 2020-09-14 DIAGNOSIS — I4892 Unspecified atrial flutter: Secondary | ICD-10-CM | POA: Diagnosis present

## 2020-09-14 DIAGNOSIS — Z20822 Contact with and (suspected) exposure to covid-19: Secondary | ICD-10-CM | POA: Diagnosis present

## 2020-09-14 DIAGNOSIS — S72141D Displaced intertrochanteric fracture of right femur, subsequent encounter for closed fracture with routine healing: Secondary | ICD-10-CM | POA: Diagnosis not present

## 2020-09-14 DIAGNOSIS — Z03818 Encounter for observation for suspected exposure to other biological agents ruled out: Secondary | ICD-10-CM | POA: Diagnosis not present

## 2020-09-14 DIAGNOSIS — E1169 Type 2 diabetes mellitus with other specified complication: Secondary | ICD-10-CM | POA: Diagnosis present

## 2020-09-14 DIAGNOSIS — I4891 Unspecified atrial fibrillation: Secondary | ICD-10-CM | POA: Diagnosis not present

## 2020-09-14 DIAGNOSIS — I130 Hypertensive heart and chronic kidney disease with heart failure and stage 1 through stage 4 chronic kidney disease, or unspecified chronic kidney disease: Secondary | ICD-10-CM | POA: Diagnosis present

## 2020-09-14 DIAGNOSIS — I272 Pulmonary hypertension, unspecified: Secondary | ICD-10-CM

## 2020-09-14 DIAGNOSIS — Z9181 History of falling: Secondary | ICD-10-CM | POA: Diagnosis not present

## 2020-09-14 DIAGNOSIS — S72001D Fracture of unspecified part of neck of right femur, subsequent encounter for closed fracture with routine healing: Secondary | ICD-10-CM | POA: Diagnosis not present

## 2020-09-14 DIAGNOSIS — R404 Transient alteration of awareness: Secondary | ICD-10-CM | POA: Diagnosis not present

## 2020-09-14 DIAGNOSIS — M810 Age-related osteoporosis without current pathological fracture: Secondary | ICD-10-CM | POA: Diagnosis not present

## 2020-09-14 DIAGNOSIS — G928 Other toxic encephalopathy: Secondary | ICD-10-CM | POA: Diagnosis present

## 2020-09-14 DIAGNOSIS — R000 Tachycardia, unspecified: Secondary | ICD-10-CM | POA: Diagnosis not present

## 2020-09-14 DIAGNOSIS — I129 Hypertensive chronic kidney disease with stage 1 through stage 4 chronic kidney disease, or unspecified chronic kidney disease: Secondary | ICD-10-CM | POA: Diagnosis not present

## 2020-09-14 DIAGNOSIS — J069 Acute upper respiratory infection, unspecified: Secondary | ICD-10-CM | POA: Diagnosis not present

## 2020-09-14 DIAGNOSIS — Z66 Do not resuscitate: Secondary | ICD-10-CM | POA: Diagnosis present

## 2020-09-14 DIAGNOSIS — E1142 Type 2 diabetes mellitus with diabetic polyneuropathy: Secondary | ICD-10-CM | POA: Diagnosis present

## 2020-09-14 DIAGNOSIS — R5381 Other malaise: Secondary | ICD-10-CM | POA: Diagnosis not present

## 2020-09-14 DIAGNOSIS — I517 Cardiomegaly: Secondary | ICD-10-CM | POA: Diagnosis not present

## 2020-09-14 DIAGNOSIS — R4020 Unspecified coma: Secondary | ICD-10-CM | POA: Diagnosis not present

## 2020-09-14 DIAGNOSIS — F339 Major depressive disorder, recurrent, unspecified: Secondary | ICD-10-CM | POA: Diagnosis not present

## 2020-09-14 DIAGNOSIS — I5032 Chronic diastolic (congestive) heart failure: Secondary | ICD-10-CM | POA: Diagnosis present

## 2020-09-14 DIAGNOSIS — N390 Urinary tract infection, site not specified: Secondary | ICD-10-CM | POA: Diagnosis present

## 2020-09-14 DIAGNOSIS — J0130 Acute sphenoidal sinusitis, unspecified: Secondary | ICD-10-CM | POA: Diagnosis present

## 2020-09-14 DIAGNOSIS — S72141A Displaced intertrochanteric fracture of right femur, initial encounter for closed fracture: Secondary | ICD-10-CM | POA: Diagnosis not present

## 2020-09-14 DIAGNOSIS — R6510 Systemic inflammatory response syndrome (SIRS) of non-infectious origin without acute organ dysfunction: Secondary | ICD-10-CM | POA: Diagnosis present

## 2020-09-14 DIAGNOSIS — F32A Depression, unspecified: Secondary | ICD-10-CM | POA: Diagnosis present

## 2020-09-14 DIAGNOSIS — E1151 Type 2 diabetes mellitus with diabetic peripheral angiopathy without gangrene: Secondary | ICD-10-CM | POA: Diagnosis present

## 2020-09-14 DIAGNOSIS — I480 Paroxysmal atrial fibrillation: Secondary | ICD-10-CM | POA: Diagnosis not present

## 2020-09-14 DIAGNOSIS — E669 Obesity, unspecified: Secondary | ICD-10-CM | POA: Diagnosis present

## 2020-09-14 DIAGNOSIS — I2510 Atherosclerotic heart disease of native coronary artery without angina pectoris: Secondary | ICD-10-CM | POA: Diagnosis present

## 2020-09-14 DIAGNOSIS — R262 Difficulty in walking, not elsewhere classified: Secondary | ICD-10-CM | POA: Diagnosis not present

## 2020-09-14 DIAGNOSIS — R443 Hallucinations, unspecified: Secondary | ICD-10-CM | POA: Diagnosis present

## 2020-09-14 DIAGNOSIS — S72001A Fracture of unspecified part of neck of right femur, initial encounter for closed fracture: Secondary | ICD-10-CM | POA: Diagnosis not present

## 2020-09-14 DIAGNOSIS — R1311 Dysphagia, oral phase: Secondary | ICD-10-CM | POA: Diagnosis not present

## 2020-09-14 DIAGNOSIS — M6281 Muscle weakness (generalized): Secondary | ICD-10-CM | POA: Diagnosis not present

## 2020-09-14 DIAGNOSIS — Z9981 Dependence on supplemental oxygen: Secondary | ICD-10-CM | POA: Diagnosis not present

## 2020-09-14 DIAGNOSIS — R531 Weakness: Secondary | ICD-10-CM | POA: Diagnosis not present

## 2020-09-14 DIAGNOSIS — R29810 Facial weakness: Secondary | ICD-10-CM | POA: Diagnosis not present

## 2020-09-14 DIAGNOSIS — K219 Gastro-esophageal reflux disease without esophagitis: Secondary | ICD-10-CM | POA: Diagnosis present

## 2020-09-14 DIAGNOSIS — N1831 Chronic kidney disease, stage 3a: Secondary | ICD-10-CM | POA: Diagnosis present

## 2020-09-14 DIAGNOSIS — E1122 Type 2 diabetes mellitus with diabetic chronic kidney disease: Secondary | ICD-10-CM | POA: Diagnosis present

## 2020-09-14 DIAGNOSIS — R4182 Altered mental status, unspecified: Secondary | ICD-10-CM | POA: Diagnosis not present

## 2020-09-14 DIAGNOSIS — Z743 Need for continuous supervision: Secondary | ICD-10-CM | POA: Diagnosis not present

## 2020-09-14 DIAGNOSIS — I4821 Permanent atrial fibrillation: Secondary | ICD-10-CM | POA: Diagnosis present

## 2020-09-14 DIAGNOSIS — R2681 Unsteadiness on feet: Secondary | ICD-10-CM | POA: Diagnosis not present

## 2020-09-14 DIAGNOSIS — R279 Unspecified lack of coordination: Secondary | ICD-10-CM | POA: Diagnosis not present

## 2020-09-14 DIAGNOSIS — J9611 Chronic respiratory failure with hypoxia: Secondary | ICD-10-CM | POA: Diagnosis present

## 2020-09-14 DIAGNOSIS — J811 Chronic pulmonary edema: Secondary | ICD-10-CM | POA: Diagnosis not present

## 2020-09-14 DIAGNOSIS — F419 Anxiety disorder, unspecified: Secondary | ICD-10-CM | POA: Diagnosis not present

## 2020-09-14 DIAGNOSIS — R0902 Hypoxemia: Secondary | ICD-10-CM | POA: Diagnosis not present

## 2020-09-14 DIAGNOSIS — Z6830 Body mass index (BMI) 30.0-30.9, adult: Secondary | ICD-10-CM | POA: Diagnosis not present

## 2020-09-14 DIAGNOSIS — E785 Hyperlipidemia, unspecified: Secondary | ICD-10-CM | POA: Diagnosis present

## 2020-09-14 LAB — GLUCOSE, CAPILLARY
Glucose-Capillary: 247 mg/dL — ABNORMAL HIGH (ref 70–99)
Glucose-Capillary: 143 mg/dL — ABNORMAL HIGH (ref 70–99)
Glucose-Capillary: 184 mg/dL — ABNORMAL HIGH (ref 70–99)

## 2020-09-14 MED ORDER — ALPRAZOLAM 1 MG PO TABS
0.5000 mg | ORAL_TABLET | Freq: Every evening | ORAL | 0 refills | Status: DC | PRN
Start: 2020-09-14 — End: 2020-10-06

## 2020-09-14 NOTE — Progress Notes (Signed)
Physical Therapy Treatment Patient Details Name: [name] MRN: [id number] DOB: 24-Mar-1928 Today's Date: 09/14/2020    History of Present Illness Trichelle Lehan Poli is a 87 y.o. female presenting with right hip fracture after mechanical fall at home, s/p IM nail on 6/6. PMH is significant for paroxsmal afib on eliquis, h/o provoked DVT, T2DM, HLD, HFpEF (EF 50-55%), depression/anxiety, GERD, CAD, CKD III, h/o skin cancer, chronic pain 2/2 spinal stenosis    PT Comments    Pt with high anxiety and continues with R LE pain with movement greatly limiting mobility progression. Pt extremely HOH and appears to be mildly confused as well. Acute PT to cont to follow. Pt remains appropriate for SNF.    Follow Up Recommendations  SNF;Supervision/Assistance - 24 hour     Equipment Recommendations  Rolling walker with 5" wheels    Recommendations for Other Services       Precautions / Restrictions Precautions Precautions: Fall Precaution Comments: very anxious, tearful Restrictions Weight Bearing Restrictions: Yes RLE Weight Bearing: Weight bearing as tolerated    Mobility  Bed Mobility Overal bed mobility: Needs Assistance Bed Mobility: Supine to Sit     Supine to sit: Mod assist;Max assist;HOB elevated     General bed mobility comments: max directional verbal cues, pt did initiate LE movement to EOB but ultimately required maxA for R LE, modA for trunk elevation    Transfers Overall transfer level: Needs assistance Equipment used: Rolling walker (2 wheeled) Transfers: Sit to/from [facility] Sit to Stand: Max assist;+2 physical assistance Stand pivot transfers: Max assist;+2 physical assistance       General transfer comment: maxA to power up and steady, pt stood x 10 sec and then became very anxious and impulsively sat despite max verbal cues and incontinence of stool, attempted deep breathing and talking through what was happening/task asked however  once up on her feet pt in tears with high anxiety, pt maxAX2 without AD for std pvt to chair, pt did better holding onto 2 people vs RW  Ambulation/Gait             General Gait Details: unable due to patients anxiety   Stairs             Wheelchair Mobility    Modified Rankin (Stroke Patients Only)       Balance Overall balance assessment: Needs assistance Sitting-balance support: Feet supported;Bilateral upper extremity supported Sitting balance-Leahy Scale: Poor     Standing balance support: Bilateral upper extremity supported Standing balance-Leahy Scale: Zero Standing balance comment: max to power up to standing                            Cognition Arousal/Alertness: Awake/alert Behavior During Therapy: Anxious Overall Cognitive Status: Impaired/Different from baseline Area of Impairment: Safety/judgement;Following commands;Memory                     Memory: Decreased short-term memory Following Commands: Follows one step commands with increased time;Follows one step commands inconsistently Safety/Judgement: Decreased awareness of safety     General Comments: pt with significant anxiety compared to first 3 visit/eval. Pt very impulsive with decresaed safety awareness attempting to sit despite max verbal cues due to anxiety screaming"i can't do it"      Exercises General Exercises - Lower Extremity Ankle Circles/Pumps: AROM;Both;10 reps;Supine Quad Sets: AROM;Right;10 reps;Supine Gluteal Sets: AROM;Both;10 reps;Seated Long Arc Quad: AAROM;Right;10 reps;Seated    General Comments  General comments (skin integrity, edema, etc.): pt continues with fragile skin. Pt at >94% on 2Lo2 via [place] t/o session      Pertinent Vitals/Pain Pain Assessment: Faces Faces Pain Scale: Hurts whole lot Pain Location: pt crying and stating "that leg" (in reference to R LE) hurts so bad Pain Descriptors / Indicators: Moaning;Crying Pain Intervention(s):  Limited activity within patient's tolerance    Home Living                      Prior Function            PT Goals (current goals can now be found in the care plan section) Progress towards PT goals: Not progressing toward goals - comment (limited by pain and anxiety)    Frequency    Min 3X/week      PT Plan Current plan remains appropriate    Co-evaluation              AM-PAC PT "6 Clicks" Mobility   Outcome Measure  Help needed turning from your back to your side while in a flat bed without using bedrails?: A Lot Help needed moving from lying on your back to sitting on the side of a flat bed without using bedrails?: A Lot Help needed moving to and from a bed to a chair (including a wheelchair)?: Total Help needed standing up from a chair using your arms (e.g., wheelchair or bedside chair)?: Total Help needed to walk in hospital room?: Total Help needed climbing 3-5 steps with a railing? : Total 6 Click Score: 8    End of Session Equipment Utilized During Treatment: Gait belt;Oxygen Activity Tolerance: Patient limited by pain Patient left: in chair;with call bell/phone within reach;with chair alarm set Nurse Communication: Mobility status PT Visit Diagnosis: Unsteadiness on feet (R26.81);Difficulty in walking, not elsewhere classified (R26.2)     Time: 4765-4650 PT Time Calculation (min) (ACUTE ONLY): 20 min  Charges:  $Therapeutic Activity: 8-22 mins                     Kittie Plater, PT, DPT Acute Rehabilitation Services Pager #: [phone] Office #: [phone]    Berline Lopes 09/14/2020, 1:51 PM

## 2020-09-14 NOTE — Telephone Encounter (Signed)
Dr. Gwendlyn Deutscher returned call stating she wanted to make Dr. Acie Fredrickson aware that patient was hospitalized and being discharged to a SNF. She is requesting a follow-up and possible echo on the same day. RN advised I would forward to Dr. Acie Fredrickson for review.

## 2020-09-14 NOTE — Care Management Important Message (Signed)
Important Message  Patient Details  Name: DORMA ALTMAN MRN: [id number] Date of Birth: 08/21/1927   Medicare Important Message Given:  Yes     Kraig Genis P Scofield 09/14/2020, 2:36 PM

## 2020-09-14 NOTE — Telephone Encounter (Signed)
[name] is calling requesting Dr. Acie Fredrickson place an order for the patient to have an Echo performed. Please advise.

## 2020-09-14 NOTE — Telephone Encounter (Signed)
RN returned call to [facility] family regarding orders needed by Dr. Gwendlyn Deutscher. Patient is currently hospitalized and discharging to a SNF. The office was unable to reach Dr. Gwendlyn Deutscher at this time. Call back number provided.

## 2020-09-14 NOTE — Progress Notes (Addendum)
Called rehab place multiple times but no one is answering my phone call. Left my phone number for them to call me .

## 2020-09-14 NOTE — Plan of Care (Signed)

## 2020-09-14 NOTE — TOC Progression Note (Signed)
Transition of Care [facility]) - Progression Note    Patient Details  Name: [name] MRN: [id number] Date of Birth: 06-Mar-1928  [facility] CM/SW Contact  Milinda Antis, [place] Phone Number: 09/14/2020, 4:49 PM  Clinical Narrative:    CSW received a message from RN that the patient is refusing to leave.  CSW contacted the patient's family, [name].  Ms. Stann Mainland is coming to the hospital to speak with the patient.     Expected Discharge Plan: Skilled Nursing Facility Barriers to Discharge: Barriers Resolved  Expected Discharge Plan and Services Expected Discharge Plan: [place] arrangements for the past 2 months: Single Family Home Expected Discharge Date: 09/14/20                                     Social Determinants of Health (SDOH) Interventions    Readmission Risk Interventions Readmission Risk Prevention Plan 09/04/2019  Transportation Screening Complete  PCP or Specialist Appt within 3-5 Days Complete  HRI or Home Care Consult Complete  Palliative Care Screening Not Applicable  Medication Review (RN Care Manager) Complete  Some recent data might be hidden

## 2020-09-14 NOTE — Progress Notes (Signed)
Family Medicine Teaching Service Daily Progress Note Intern Pager: [phone]  Patient name: [name] Medical record number: [id number] Date of birth: Mar 25, 1928 Age: 87 y.o. Gender: female  Primary Care Provider: Ria Bush, MD Consultants: Ortho Code Status: Full  Pt Overview and Major Events to Date:  6/4 Admitted 6/6- RLE intramedullary nail sx  Assessment and Plan: ZEYA BALLES is a 87 y.o. female presenting with right hip fracture, no s/p intramedullary nail. PMH is significant for paroxsmal afib on eliquis, h/o provoked DVT, T2DM, HLD, HFpEF (EF 50-55%), depression/anxiety, GERD, CAD, CKD III, h/o skin cancer, chronic pain 2/2 spinal stenosis  Medically Stable to be Discharged to SNF  Right femoral neck fracture s/p intramedullary nail Stable on 2L [place] O2.  Post Op Day 7. Plan for ECHO outpatient.  Will not need to Ambulate with Pulse Ox since patient going to SNF - Touch base with social work about Dispo   FEN/GI: Heart Healthy PPx: Eliquis   Status is: Inpatient  Remains inpatient appropriate because:Unsafe d/c plan  Dispo: The patient is from: Home              Anticipated d/c is to: SNF              Patient currently is medically stable to d/c.   Difficult to place patient Yes   Subjective:  Patient expresses anxiety with walking given scared of falls.  Objective: Temp:  [98.1 F (36.7 C)-99 F (37.2 C)] 98.1 F (36.7 C) (06/13 4888) Pulse Rate:  [42-106] 81 (06/13 0632) Resp:  [15-17] 16 (06/13 0632) BP: (113-136)/(57-82) 123/82 (06/13 9169) SpO2:  [95 %-100 %] 97 % (06/13 4503) Physical Exam:  Physical Exam Constitutional:      Appearance: Normal appearance.  HENT:     Head: Normocephalic and atraumatic.  Cardiovascular:     Rate and Rhythm: Normal rate and regular rhythm.  Pulmonary:     Effort: Pulmonary effort is normal.     Breath sounds: Normal breath sounds.  Abdominal:     General: Abdomen is flat. Bowel sounds are  normal.     Palpations: Abdomen is soft.  Skin:    General: Skin is warm.  Neurological:     General: No focal deficit present.     Mental Status: She is alert.     Laboratory: Recent Labs  Lab 09/09/20 0314 09/10/20 0147 09/13/20 0540  WBC 13.7* 10.2 9.7  HGB 10.8* 10.4* 10.8*  HCT 34.4* 33.6* 33.7*  PLT 151 180 228   Recent Labs  Lab 09/09/20 0314 09/11/20 0606 09/13/20 0540  NA 134* 137 137  K 4.4 4.4 3.9  CL 100 97* 93*  CO2 26 33* 34*  BUN 40* 30* 30*  CREATININE 1.06* 0.81 0.87  CALCIUM 8.4* 8.5* 8.8*  GLUCOSE 174* 168* 157*     Imaging/Diagnostic Tests: No new imaging  Delora Fuel, MD 09/14/2020, 8:06 AM PGY-1, [place] Intern pager: [phone], text pages welcome

## 2020-09-14 NOTE — Progress Notes (Signed)
FPTS Interim Progress Note  Patient sleeping and resting comfortably.  Rounded with primary RN.  No concerns voiced.  No orders required.  Appreciated nightly round.  Today's Vitals   09/13/20 2324 09/14/20 0007 09/14/20 0327 09/14/20 0413  BP:      Pulse: (!) 106     Resp:      Temp:      TempSrc:      SpO2:      Weight:      Height:      PainSc:  Asleep 8  Asleep    Carollee Leitz, MD 09/14/2020, 6:17 AM PGY-2, [place] Medicine Service pager [phone]

## 2020-09-14 NOTE — TOC Transition Note (Signed)
Transition of Care [facility]) - CM/SW Discharge Note   Patient Details  Name: [name] MRN: [id number] Date of Birth: [birth year]  Transition of Care [facility]) CM/SW Contact:  Milinda Antis, [place] Phone Number: 09/14/2020, 12:25 PM   Clinical Narrative:    Patient will DC to: SNF Anticipated DC date: 09/14/2020 Family notified: Yes Transport by: Corey Harold   Per MD patient ready for DC to [facility].  RN, patient, patient's family, and facility notified of DC. Discharge Summary and FL2 sent to facility. DC packet on chart. Ambulance transport requested for patient.   CSW will sign off for now as social work intervention is no longer needed. Please consult [place] again if new needs arise.     Final next level of care: Skilled Nursing Facility Barriers to Discharge: Barriers Resolved   Patient Goals and CMS Choice   CMS Medicare.gov Compare Post Acute Care list provided to:: Patient Represenative (must comment) Choice offered to / list presented to : Adult Children  Discharge Placement              Patient chooses bed at: [facility] Patient to be transferred to facility by: [place] Name of family member notified: [name] (Granddaughter)   [phone] Patient and family notified of of transfer: 09/14/20  Discharge Plan and Services                                     Social Determinants of Health (SDOH) Interventions     Readmission Risk Interventions Readmission Risk Prevention Plan 09/04/2019  Transportation Screening Complete  PCP or Specialist Appt within 3-5 Days Complete  HRI or Home Care Consult Complete  Palliative Care Screening Not Applicable  Medication Review (RN Care Manager) Complete  Some recent data might be hidden

## 2020-09-15 ENCOUNTER — Non-Acute Institutional Stay (SKILLED_NURSING_FACILITY): Payer: Medicare Other | Admitting: Adult Health

## 2020-09-15 ENCOUNTER — Encounter: Payer: Self-pay | Admitting: Adult Health

## 2020-09-15 DIAGNOSIS — I480 Paroxysmal atrial fibrillation: Secondary | ICD-10-CM

## 2020-09-15 DIAGNOSIS — E1151 Type 2 diabetes mellitus with diabetic peripheral angiopathy without gangrene: Secondary | ICD-10-CM | POA: Diagnosis not present

## 2020-09-15 DIAGNOSIS — F419 Anxiety disorder, unspecified: Secondary | ICD-10-CM

## 2020-09-15 DIAGNOSIS — F339 Major depressive disorder, recurrent, unspecified: Secondary | ICD-10-CM

## 2020-09-15 DIAGNOSIS — I5032 Chronic diastolic (congestive) heart failure: Secondary | ICD-10-CM | POA: Diagnosis not present

## 2020-09-15 DIAGNOSIS — I2510 Atherosclerotic heart disease of native coronary artery without angina pectoris: Secondary | ICD-10-CM | POA: Diagnosis not present

## 2020-09-15 DIAGNOSIS — S72001A Fracture of unspecified part of neck of right femur, initial encounter for closed fracture: Secondary | ICD-10-CM | POA: Diagnosis not present

## 2020-09-15 NOTE — Progress Notes (Signed)
Location:  [place] Room Number: [place] of Service:  SNF (31) Provider:  Durenda Age, DNP, FNP-BC  Patient Care Team: Ria Bush, MD as PCP - General (Family Medicine) Nahser, Wonda Cheng, MD as PCP - Cardiology (Cardiology) [name], [facility] as Pharmacist (Pharmacist)  Extended Emergency Contact Information Primary Emergency Contact: Juanna Cao [place] Phone: [phone] Relation: Granddaughter Secondary Emergency Contact: McIntosh, [place] Phone: [phone] Relation: Significant other Preferred language: English Interpreter needed? No  Code Status:  FULL CODE  Goals of care: Advanced Directive information Advanced Directives 09/15/2020  Does Patient Have a Medical Advance Directive? No  Type of Advance Directive -  Does patient want to make changes to medical advance directive? -  Copy of [place] in Chart? -  Would patient like information on creating a medical advance directive? No - Patient declined     Chief Complaint  Patient presents with   [facility] follow up.    HPI:  Pt is a 87 y.o. female who was admitted to [place] on 09/14/20 post hospital admission 09/05/20 to [id number] S/P fall at home sustaining right hip fracture. She had ORIF with intramedullary nail on 09/07/20.  Eliquis was initially held for surgery and was resumed on 09/08/2020.  She was placed on oxygen on admission.  Chest x-ray was negative.  She was weaned off the oxygen but desatted to 88%.  He did not have chest pain nor difficulty breathing.  CTA was negative for PE.  He remains actively on O2 at 2 L upon discharge.  He had an abnormal urinalysis with elevated WBC during hospitalization.  She received 3 doses of cefazolin, followed by Keflex to finish in 5 days of treatment for UTI concerns. WBC has now normalized.  He has a PMH of paroxysmal atrial fibrillation on  Eliquis, history of provoked DVT, type 2 diabetes mellitus, hyperlipidemia, depression/anxiety, GERD, CAD, CKD 3, history of skin cancer and chronic pain secondary to spinal stenosis.  She was seen today in her room with her sister-in-law and granddaughter-in-law at bedside. Right hip surgical incision  has staples, no redness.   Past Medical History:  Diagnosis Date   Actinic keratosis 01/22/2020   right forearm   Acute deep vein thrombosis (DVT) of distal vein of right lower extremity ([place]) 12/2013   Acute thrombus of R proximal gastrocnemius vein. Symptomatic provoked distal DVT   Anxiety    Basal cell carcinoma 10/30/2018   R nasal bridge, treated with Moh's   Basal cell carcinoma 01/22/2020   left forearm, nodular   Cellulitis of leg, left 01/16/2014   CHF (congestive heart failure) (HCC)    Chronic back pain 03/2012   lumbar DDD with herniation - s/p L S1 transforaminal ESI (10/2012 Dr. Sharlet Salina)   Chronic venous insufficiency    Coronary artery disease    CABG 1998   Cystocele    Depression    Diabetes mellitus    type 2, followed by endo.   Diastolic dysfunction    Diverticulosis    Hemorrhoids    History of heart attack    Hx of echocardiogram    a. Echo 10/13: mild LVH, EF 55-60%, Gr 1 diast dysfn, PASP 32   Hyperlipidemia    Hypertension    Irritable bowel syndrome    Microcytic anemia    PAD (peripheral artery disease) ([place]) 2013   ABI: R 0.91, L 0.83  Postmenopausal osteoporosis 01/2019   T score of -2.6   PUD (peptic ulcer disease)    Varicose veins    Venous ulcer of leg ([place]) 12/12/2013   Required prolonged treatment by wound center   Viral gastroenteritis due to [place] virus 05/13/2015   hospitalization   Past Surgical History:  Procedure Laterality Date   ABDOMINAL HYSTERECTOMY  1975   TAH,BSO   ABI  2013   R 0.91, L 0.83   APPENDECTOMY     per pt report   CARDIOVASCULAR STRESS TEST  2015   low risk myoview (Nahser)   CATARACT SURGERY      CHOLECYSTECTOMY     COLONOSCOPY  02/2010   extensive diverticulosis throughout, small internal hemorrhoids, rec rpt 5 yrs (Dr. Allyn Kenner)   [place]   dexa scan  09/2011   femur -0.8, forearm 0.6 => normal   ENDOVENOUS ABLATION SAPHENOUS VEIN W/ LASER Left 03/2014   Kellie Simmering   ESI  2014   L S1 transforaminal ESI x2 (Chasnis) without improvement   HAND SURGERY     INTRAMEDULLARY (IM) NAIL INTERTROCHANTERIC Right 09/07/2020   Procedure: INTRAMEDULLARY (IM) NAIL INTERTROCHANTRIC;  Surgeon: Leandrew Koyanagi, MD;  Location: [place];  Service: Orthopedics;  Laterality: Right;   lexiscan myoview  03/2011   Small basal inferolateral and anterolateral reversible perfusion defect suggests ischemia.  EF was normal.   old infarct, no new ischemia   LOWER EXTREMITY ANGIOGRAPHY Right 09/06/2018   Procedure: LOWER EXTREMITY ANGIOGRAPHY;  Surgeon: Algernon Huxley, MD;  Location: [place] [facility];  Service: Cardiovascular;  Laterality: Right;   toe surgery     TYMPANOPLASTY  1960s   R side   Vault prolapse A & P repair  2009   Dr Rhodia Albright [facility] hospital    Allergies  Allergen Reactions   Metformin And Related Diarrhea    Outpatient Encounter Medications as of 09/15/2020  Medication Sig   ALPRAZolam (XANAX) 1 MG tablet Take 0.5 tablets (0.5 mg total) by mouth at bedtime as needed for anxiety.   alum & mag hydroxide-simeth (MAALOX/MYLANTA) 200-200-20 MG/5ML suspension Take 30 mLs by mouth every 4 (four) hours as needed for indigestion.   B Complex-C (B COMPLEX-VITAMIN C PO) Take 1 tablet by mouth daily. +zinc   bisacodyl (DULCOLAX) 10 MG suppository Place 10 mg rectally as needed for moderate constipation. : If not relieved by MOM, give 10 mg Bisacodyl suppositiory rectally X 1 dose in 24 hours as needed   diclofenac sodium (VOLTAREN) 1 % GEL Apply 1 application topically 3 (three) times daily.   DILT-XR 120 MG 24 hr capsule TAKE 1 CAPSULE BY MOUTH EVERY DAY   ELIQUIS 5 MG TABS tablet  TAKE 1 TABLET BY MOUTH TWICE A DAY   famotidine (PEPCID) 20 MG tablet Take 1 tablet (20 mg total) by mouth daily as needed for heartburn (breakthrough heartburn).   fluticasone (FLONASE) 50 MCG/ACT nasal spray Place 1 spray into both nostrils daily as needed for allergies.   furosemide (LASIX) 40 MG tablet Take 1 tablet (40 mg total) by mouth 2 (two) times daily.   glucosamine-chondroitin 500-400 MG tablet Take 1 tablet by mouth daily.   loratadine (CLARITIN) 10 MG tablet Take 10 mg by mouth daily as needed for allergies.   Magnesium Hydroxide (MILK OF MAGNESIA PO) Take 30 mLs by mouth. If no BM in 3 days, give 30 cc Milk of Magnesium p.o. x 1 dose in 24 hours as needed  methocarbamol (ROBAXIN) 500 MG tablet Take 1 tablet (500 mg total) by mouth every 6 (six) hours as needed for muscle spasms.   metoprolol tartrate (LOPRESSOR) 50 MG tablet TAKE 1 TABLET BY MOUTH TWICE A DAY   Multiple Vitamins-Minerals (ICAPS) CAPS Take 1 capsule by mouth daily.   nitroGLYCERIN (NITROSTAT) 0.4 MG SL tablet Place 1 tablet (0.4 mg total) under the tongue every 5 (five) minutes as needed for chest pain.   nystatin ointment (MYCOSTATIN) APPLY TO AFFECTED AREA TWICE DAILY   ondansetron (ZOFRAN) 4 MG tablet Take 1 tablet (4 mg total) by mouth every 6 (six) hours as needed for nausea.   pantoprazole (PROTONIX) 40 MG tablet TAKE 1 TABLET BY MOUTH TWICE A DAY   pioglitazone (ACTOS) 15 MG tablet TAKE 1 TABLET BY MOUTH EVERY DAY   polyethylene glycol (MIRALAX / GLYCOLAX) 17 g packet Take 17 g by mouth daily as needed for mild constipation.   potassium chloride (MICRO-K) 10 MEQ CR capsule TAKE ONE CAPSULE BY MOUTH ONCE DAILY   pravastatin (PRAVACHOL) 20 MG tablet Take 1 tablet (20 mg total) by mouth daily.   Probiotic Product (ALIGN) 4 MG CAPS Take 1 capsule (4 mg total) by mouth daily.   senna (SENOKOT) 8.6 MG TABS tablet Take 1 tablet (8.6 mg total) by mouth daily.   sertraline (ZOLOFT) 50 MG tablet Take 1 tablet (50 mg  total) by mouth daily.   sitaGLIPtin (JANUVIA) 100 MG tablet TAKE 1/2 TABLET BY MOUTH EVERY DAY   Sodium Phosphates (RA SALINE ENEMA RE) Place rectally. If not relieved by Biscodyl suppository, give disposable Saline Enema rectally X 1 dose/24 hrs as needed (   vitamin E 1000 UNIT capsule Take 1,000 Units by mouth daily.   [DISCONTINUED] HYDROcodone-acetaminophen (NORCO/VICODIN) 5-325 MG tablet Take 1 tablet by mouth every 6 (six) hours as needed for moderate pain or severe pain.   HYDROcodone-acetaminophen (NORCO/VICODIN) 5-325 MG tablet Take 1 tablet by mouth every 6 (six) hours as needed for moderate pain or severe pain.   Lancets (ONETOUCH DELICA PLUS [id number]) MISC Use to test blood sugar once daily dx code E11.9   ONETOUCH VERIO test strip USE TO TEST BLOOD SUGAR ONCE DAILY. DX:E11.9   No facility-administered encounter medications on file as of 09/15/2020.    Review of Systems  GENERAL: No change in appetite, no fatigue, no weight changes, no fever, chills or weakness MOUTH and THROAT: Denies oral discomfort, gingival pain or bleeding, RESPIRATORY: no cough, SOB, DOE, wheezing, hemoptysis CARDIAC: No chest pain, edema or palpitations GI: No abdominal pain, diarrhea, constipation, heart burn, nausea or vomiting GU: Denies dysuria, frequency, hematuria or discharge NEUROLOGICAL: Denies dizziness, syncope, numbness, or headache PSYCHIATRIC: Denies feelings of depression or anxiety. No report of hallucinations, insomnia, paranoia, or agitation   Immunization History  Administered Date(s) Administered   Fluad Quad(high Dose 65+) 01/08/2019, 12/25/2019   Influenza Split 12/20/2010   Influenza, High Dose Seasonal PF 03/02/2015, 01/15/2018   Influenza,inj,Quad PF,6+ Mos 01/01/2013, 12/12/2013, 12/15/2015, 01/09/2017   Janssen (J&J) SARS-COV-2 Vaccination 06/11/2019   PFIZER Comirnaty(Gray Top)Covid-19 Tri-Sucrose Vaccine 09/03/2020, 09/05/2020   Pneumococcal Conjugate-13 06/17/2013    Pneumococcal Polysaccharide-23 11/16/2015   Td 10/19/2017   Zoster, Live 09/06/2010   Pertinent  Health Maintenance Due  Topic Date Due   FOOT EXAM  04/12/2019   MAMMOGRAM  05/12/2020   OPHTHALMOLOGY EXAM  06/02/2020   COLONOSCOPY (Pts 45-66yrs Insurance coverage will need to be confirmed)  04/03/2049 (Originally 02/12/2015)   INFLUENZA VACCINE  11/02/2020   HEMOGLOBIN A1C  02/28/2021   DEXA SCAN  Completed   PNA vac Low Risk Adult  Completed   Fall Risk  02/12/2020 09/11/2019 04/30/2019 02/27/2019 02/05/2019  Falls in the past year? 1 1 0 0 1  Comment loss balance and fell - - - -  Number falls in past yr: 0 0 - - 0  Injury with Fall? 0 0 - - 0  Risk Factor Category  - - - - -  Risk for fall due to : Medication side effect;Impaired balance/gait History of fall(s);Impaired balance/gait;Impaired mobility - - Medication side effect;History of fall(s)  Follow up Falls evaluation completed;Falls prevention discussed Falls evaluation completed - - Falls evaluation completed;Falls prevention discussed     Vitals:   09/15/20 1123  BP: 108/64  Pulse: 83  Resp: 20  Temp: (!) 97.4 F (36.3 C)  SpO2: 96%  Height: 4\' 8"  (1.422 m)   Body mass index is 30.79 kg/m.  Physical Exam  GENERAL APPEARANCE: Well nourished. In no acute distress. Obese. SKIN:  see HPI MOUTH and THROAT: Lips are without lesions. Oral mucosa is moist and without lesions.  RESPIRATORY: Breathing is even & unlabored, BS CTAB CARDIAC: Irregular heart rhythm, no murmur,no extra heart sounds, no edema GI: Abdomen soft, normal BS, no masses, no tenderness, NEUROLOGICAL: There is no tremor. Speech is clear. PSYCHIATRIC:  Affect and behavior are appropriate  Labs reviewed: Recent Labs    02/19/20 1332 07/29/20 1516 09/05/20 1950 09/07/20 0333 09/08/20 0248 09/09/20 0314 09/11/20 0606 09/13/20 0540  NA 140 141   < > 135   < > 134* 137 137  K 4.2 4.3   < > 3.9   < > 4.4 4.4 3.9  CL 99 100   < > 97*   < > 100  97* 93*  CO2 35* 33*   < > 30   < > 26 33* 34*  GLUCOSE 130* 84   < > 174*   < > 174* 168* 157*  BUN 28* 29*   < > 31*   < > 40* 30* 30*  CREATININE 1.05 1.07   < > 1.21*   < > 1.06* 0.81 0.87  CALCIUM 9.5 9.3   < > 9.4   < > 8.4* 8.5* 8.8*  MG  --   --   --  1.7  --   --  1.7  --   PHOS 3.4 3.6  --   --   --   --   --   --    < > = values in this interval not displayed.   Recent Labs    12/24/19 1503 02/19/20 1332 07/29/20 1516  AST 17  --   --   ALT 11  --   --   ALKPHOS 126*  --   --   BILITOT 0.5  --   --   PROT 7.2  --   --   ALBUMIN 3.8 3.8 3.9   Recent Labs    02/19/20 1332 07/29/20 1516 09/05/20 1950 09/09/20 0314 09/10/20 0147 09/13/20 0540  WBC 9.3 10.3   < > 13.7* 10.2 9.7  NEUTROABS 6.6 7.3  --   --   --  6.9  HGB 13.1 13.0   < > 10.8* 10.4* 10.8*  HCT 40.1 39.4   < > 34.4* 33.6* 33.7*  MCV 90.3 87.8   < > 93.7 93.6 91.1  PLT 223.0 230.0   < > 151 180 228   < > =  values in this interval not displayed.   Lab Results  Component Value Date   TSH 3.60 07/29/2020   Lab Results  Component Value Date   HGBA1C 7.3 (A) 08/28/2020   Lab Results  Component Value Date   CHOL 131 12/24/2019   HDL 50.50 12/24/2019   LDLCALC 67 12/24/2019   TRIG 71.0 12/24/2019   CHOLHDL 3 12/24/2019    Significant Diagnostic Results in last 30 days:  DG Pelvis 1-2 Views  Result Date: 09/05/2020 CLINICAL DATA:  Fall, RIGHT hip pain. EXAM: PELVIS - 1-2 VIEW COMPARISON:  None. FINDINGS: Displaced fracture of the RIGHT femoral neck, possibly comminuted, with associated angulation deformity. Questionable fracture within the LEFT superior pubic ramus, but this is more likely artifactual related to patient positioning and overlying bowel loop. Remainder of the osseous structures of the pelvis appear intact and normally aligned. Extensive vascular calcifications. IMPRESSION: 1. Displaced fracture of the RIGHT femoral neck, possibly comminuted, with associated angulation deformity. 2.  Questionable fracture within the LEFT superior pubic ramus, but this is more likely artifactual related to patient positioning and overlying bowel loop. Consider CT of the pelvis for further characterization. Electronically Signed   By: Franki Cabot M.D.   On: 09/05/2020 19:35   CT Angio Chest Pulmonary Embolism (PE) W or WO Contrast  Result Date: 09/12/2020 CLINICAL DATA:  Elevated D-dimer. Recent hip repair. Pulmonary embolism suspected EXAM: CT ANGIOGRAPHY CHEST WITH CONTRAST TECHNIQUE: Multidetector CT imaging of the chest was performed using the standard protocol during bolus administration of intravenous contrast. Multiplanar CT image reconstructions and MIPs were obtained to evaluate the vascular anatomy. CONTRAST:  29mL OMNIPAQUE IOHEXOL 350 MG/ML SOLN COMPARISON:  07/16/2019 FINDINGS: Cardiovascular: Satisfactory opacification of the pulmonary arteries to the segmental level. No evidence of pulmonary embolism. Enlarged heart size. No pericardial effusion. Aortic and coronary atherosclerosis. There has been CABG. No systemic arterial opacification on this PE study. Mediastinum/Nodes: Negative for adenopathy or mass. Lungs/Pleura: Minimal dependent atelectasis and subpleural reticulation. Trace bilateral pleural effusion. There is no edema, consolidation, or pneumothorax. Upper Abdomen: Atheromatous calcification. Musculoskeletal: Degenerative disc narrowing and endplate spurring in the thoracic spine. Advanced glenohumeral osteoarthritis on the right. Review of the MIP images confirms the above findings. IMPRESSION: 1. Negative for pulmonary embolism. 2. Trace pleural fluid. 3. Aortic Atherosclerosis (ICD10-I70.0). Electronically Signed   By: Monte Fantasia M.D.   On: 09/12/2020 06:59   DG CHEST PORT 1 VIEW  Result Date: 09/11/2020 CLINICAL DATA:  Oxygen desaturation EXAM: PORTABLE CHEST 1 VIEW COMPARISON:  September 09, 2020 FINDINGS: There is mild left base atelectasis. There is no edema or airspace  opacity. There is cardiomegaly with pulmonary vascularity within normal limits. There is aortic atherosclerosis. Patient is status post coronary artery bypass grafting. No adenopathy. No appreciable regions. IMPRESSION: Left base atelectasis. No edema or airspace opacity. Cardiomegaly with postoperative changes. Aortic Atherosclerosis (ICD10-I70.0). Electronically Signed   By: Lowella Grip III M.D.   On: 09/11/2020 16:17   DG CHEST PORT 1 VIEW  Result Date: 09/09/2020 CLINICAL DATA:  Oxygen desaturation. EXAM: PORTABLE CHEST 1 VIEW COMPARISON:  09/06/2020 FINDINGS: 1442 hours. The cardio pericardial silhouette is enlarged. Diffuse interstitial opacity is stable to minimally progressed in the interval, suggesting edema. Streaky opacity in the retrocardiac left base suggest atelectasis. Patient is status post CABG. Bones are diffusely demineralized. IMPRESSION: Stable to minimally progressed diffuse interstitial opacity suggesting edema. Electronically Signed   By: Misty Stanley M.D.   On: 09/09/2020 16:16   DG CHEST  PORT 1 VIEW  Result Date: 09/06/2020 CLINICAL DATA:  Cardiac palpitations and chest pain EXAM: PORTABLE CHEST 1 VIEW COMPARISON:  Sep 02, 2019 FINDINGS: No edema or airspace opacity. Heart is enlarged with pulmonary vascularity within normal limits. No adenopathy. Patient is status post coronary artery bypass grafting. There is aortic atherosclerosis. There is degenerative change in the right shoulder with superior migration of the right humeral head. Calcification noted in right carotid artery. IMPRESSION: Stable cardiac prominence with postoperative change. No edema or airspace opacity. Aortic Atherosclerosis (ICD10-I70.0). Calcification also noted in right carotid artery. Apparent chronic rotator cuff tear on the right. Electronically Signed   By: Lowella Grip III M.D.   On: 09/06/2020 08:22   DG C-Arm 1-60 Min  Result Date: 09/07/2020 CLINICAL DATA:  Right intramedullary nail  fixation. EXAM: DG C-ARM 1-60 MIN; OPERATIVE RIGHT HIP WITH PELVIS FLUOROSCOPY TIME:  Fluoroscopy Time:  1 minutes 14 seconds. Radiation Exposure Index (if provided by the fluoroscopic device): 18.57 mGy. Number of Acquired Spot Images: 4 COMPARISON:  September 05, 2020. FINDINGS: Four C-arm fluoroscopic images were obtained intraoperatively and submitted for post operative interpretation. Initial images demonstrate right femoral neck fracture, better characterized on recent radiographs. Subsequent images demonstrate fixation of the fracture with intramedullary nail and screws with hardware partially imaged. Alignment appears near anatomic. No unexpected findings. Vascular calcifications. Please see the performing provider's procedural report for further detail. IMPRESSION: Intraoperative fluoroscopy, as detailed above. Electronically Signed   By: Margaretha Sheffield MD   On: 09/07/2020 15:35   DG HIP OPERATIVE UNILAT WITH PELVIS RIGHT  Result Date: 09/07/2020 CLINICAL DATA:  Right intramedullary nail fixation. EXAM: DG C-ARM 1-60 MIN; OPERATIVE RIGHT HIP WITH PELVIS FLUOROSCOPY TIME:  Fluoroscopy Time:  1 minutes 14 seconds. Radiation Exposure Index (if provided by the fluoroscopic device): 18.57 mGy. Number of Acquired Spot Images: 4 COMPARISON:  September 05, 2020. FINDINGS: Four C-arm fluoroscopic images were obtained intraoperatively and submitted for post operative interpretation. Initial images demonstrate right femoral neck fracture, better characterized on recent radiographs. Subsequent images demonstrate fixation of the fracture with intramedullary nail and screws with hardware partially imaged. Alignment appears near anatomic. No unexpected findings. Vascular calcifications. Please see the performing provider's procedural report for further detail. IMPRESSION: Intraoperative fluoroscopy, as detailed above. Electronically Signed   By: Margaretha Sheffield MD   On: 09/07/2020 15:35   DG FEMUR, MIN 2 VIEWS  RIGHT  Result Date: 09/05/2020 CLINICAL DATA:  Fall, RIGHT hip pain. EXAM: RIGHT FEMUR 2 VIEWS COMPARISON:  None. FINDINGS: Displaced/comminuted fracture of the RIGHT femoral neck, subcapital to intratrochanteric region, with angulation deformity at the fracture site. Femoral head remains grossly well aligned with the acetabulum. Distal femur appears intact and normally aligned. Extensive vascular calcifications. Vascular stent in place from the level of the upper thigh to the knee. IMPRESSION: Displaced/comminuted fracture of the RIGHT femoral neck, subcapital to intratrochanteric region, with associated angulation deformity. Electronically Signed   By: Franki Cabot M.D.   On: 09/05/2020 19:31    Assessment/Plan  1. Closed displaced fracture of right femoral neck (HCC) -  S/P ORIF with intramedullary nail on 09/07/2020 -    Eliquis was initially held for surgery and was resumed on 09/08/2020 -   Continue Norco 5-325 mg 1 tab every 4 hours PRN for pain and methocarbamol 500 mg 1 tab twice a day PRN for muscle spasm -   Follow-up with orthopedics in 2 weeks  2. Paroxysmal atrial fibrillation (HCC) -   Rate  controlled, continue metoprolol tartrate 50 mg twice a day and diltiazem 12-hour ER 120 mg 1 capsule daily  3. Diabetes mellitus type 2 with peripheral artery disease (HCC) Lab Results  Component Value Date   HGBA1C 7.3 (A) 08/28/2020   -    Continue pioglitazone 15 mg daily and Januvia 50 mg daily  4. Chronic heart failure with preserved ejection fraction (HCC) -Stable, continue furosemide 40 mg twice a day -   For echo on 10/02/20-  5. Major depression, recurrent, chronic (HCC) -Mood is  stable, continue sertraline 50 mg daily  6. Coronary artery disease involving native coronary artery of native heart without angina pectoris -   Denies chest pain, continue NTG PRN, Eliquis and pravastatin  7.  Anxiety -   Continue alprazolam 0.5 mg 1 tab at bedtime PRN X 14 days   Family/ staff  Communication:   Discussed plan of care with resident and charge nurse.  Labs/tests ordered: None  Goals of care:   Short-term care   Durenda Age, DNP, MSN, FNP-BC [facility] [phone] (Monday-Friday 8:00 a.m. - 5:00 p.m.) [phone] (after hours)

## 2020-09-16 MED ORDER — HYDROCODONE-ACETAMINOPHEN 5-325 MG PO TABS: 1.0000 | ORAL_TABLET | Freq: Four times a day (QID) | ORAL | 0 refills | Status: DC | PRN

## 2020-09-18 ENCOUNTER — Telehealth: Payer: Self-pay

## 2020-09-18 ENCOUNTER — Telehealth: Payer: Self-pay | Admitting: Family Medicine

## 2020-09-18 NOTE — Telephone Encounter (Addendum)
Spoke with pt relaying Dr. Synthia Innocent message.  Pt verbalizes understanding, although not happy about it.  When asked about her hip, she states, "It hurts.  It really hurts."  Then pt disconnected call.  Tried to call back.  No answer.

## 2020-09-18 NOTE — Telephone Encounter (Signed)
Just FYI. Patient called and had severe slurred speech. Patient is current in rehab and from what I gathered she is stating that she "much rather do without eating with what they are feeding her." She states they are feeding her tomato sauce and gravy. She is wanting Dr Darnell Level to put in special dietary needs for her. Patient was extremely hard to understand and not making much sense. This is what I could understand. EM

## 2020-09-18 NOTE — Telephone Encounter (Addendum)
I cannot order diet at her rehab.  Recommend she talk with medical team there to review her diet. I suspect she is on a heart healthy and diabetic diet.  How is she recovering after hip fracture surgery?

## 2020-09-18 NOTE — Chronic Care Management (AMB) (Addendum)
Chronic Care Management Pharmacy Assistant   Name: IYARI HAGNER  MRN: [id number] DOB: June 23, 1927  Reason for Encounter: Patient Assistance Update - Eliquis  Recent office visits:  08/28/20- PCP- Increased sertraline from 25 mg to 50 mg daily. Patient reports taking trazodone 1/4 - 1/2 tablet at night PRN sleep.  07/29/20 PCP- Ordred CBC with diff, Renal panel, TSH. Patient self discontinued trazodone. Started sertraline 25 mg.   Recent consult visits:  09/03/20- Dermatology - Destruction of lesion.  Hospital visits:  Medication Reconciliation was completed by comparing discharge summary, patient's EMR and Pharmacy list, and upon discussion with patient. Admitted to the hospital on 09/05/20 due to displaced intertrochanteric fracture of right femur. Discharge date was 09/14/20. Discharged from [facility].  Discharged to nursing facility.   New?Medications Started at [facility] Discharge:?? -started   Maalox/mylanta  Methocarbamol for muscle spasms  Miralax for constipation  Senna for constipation  Medication Changes at Hospital Discharge:  Medications Discontinued at Hospital Discharge: -Stopped imodium   Medications that remain the same after Hospital Discharge:??  -All other medications will remain the same.    Eliquis was initially held for surgery, and was resumed on 6/7 Januvia and Pioglitazone were held and she was placed on sliding scale insulin while hospitalized.  Medications: Outpatient Encounter Medications as of 09/18/2020  Medication Sig   ALPRAZolam (XANAX) 1 MG tablet Take 0.5 tablets (0.5 mg total) by mouth at bedtime as needed for anxiety.   alum & mag hydroxide-simeth (MAALOX/MYLANTA) 200-200-20 MG/5ML suspension Take 30 mLs by mouth every 4 (four) hours as needed for indigestion.   B Complex-C (B COMPLEX-VITAMIN C PO) Take 1 tablet by mouth daily. +zinc   bisacodyl (DULCOLAX) 10 MG suppository Place 10 mg rectally as needed for moderate constipation.  : If not relieved by MOM, give 10 mg Bisacodyl suppositiory rectally X 1 dose in 24 hours as needed   diclofenac sodium (VOLTAREN) 1 % GEL Apply 1 application topically 3 (three) times daily.   DILT-XR 120 MG 24 hr capsule TAKE 1 CAPSULE BY MOUTH EVERY DAY   ELIQUIS 5 MG TABS tablet TAKE 1 TABLET BY MOUTH TWICE A DAY   famotidine (PEPCID) 20 MG tablet Take 1 tablet (20 mg total) by mouth daily as needed for heartburn (breakthrough heartburn).   fluticasone (FLONASE) 50 MCG/ACT nasal spray Place 1 spray into both nostrils daily as needed for allergies.   furosemide (LASIX) 40 MG tablet Take 1 tablet (40 mg total) by mouth 2 (two) times daily.   glucosamine-chondroitin 500-400 MG tablet Take 1 tablet by mouth daily.   HYDROcodone-acetaminophen (NORCO/VICODIN) 5-325 MG tablet Take 1 tablet by mouth every 6 (six) hours as needed for moderate pain or severe pain.   Lancets (ONETOUCH DELICA PLUS DGUYQI34V) MISC Use to test blood sugar once daily dx code E11.9   loratadine (CLARITIN) 10 MG tablet Take 10 mg by mouth daily as needed for allergies.   Magnesium Hydroxide (MILK OF MAGNESIA PO) Take 30 mLs by mouth. If no BM in 3 days, give 30 cc Milk of Magnesium p.o. x 1 dose in 24 hours as needed   methocarbamol (ROBAXIN) 500 MG tablet Take 1 tablet (500 mg total) by mouth every 6 (six) hours as needed for muscle spasms.   metoprolol tartrate (LOPRESSOR) 50 MG tablet TAKE 1 TABLET BY MOUTH TWICE A DAY   Multiple Vitamins-Minerals (ICAPS) CAPS Take 1 capsule by mouth daily.   nitroGLYCERIN (NITROSTAT) 0.4 MG SL tablet Place 1  tablet (0.4 mg total) under the tongue every 5 (five) minutes as needed for chest pain.   nystatin ointment (MYCOSTATIN) APPLY TO AFFECTED AREA TWICE DAILY   ondansetron (ZOFRAN) 4 MG tablet Take 1 tablet (4 mg total) by mouth every 6 (six) hours as needed for nausea.   ONETOUCH VERIO test strip USE TO TEST BLOOD SUGAR ONCE DAILY. DX:E11.9   pantoprazole (PROTONIX) 40 MG tablet TAKE  1 TABLET BY MOUTH TWICE A DAY   pioglitazone (ACTOS) 15 MG tablet TAKE 1 TABLET BY MOUTH EVERY DAY   polyethylene glycol (MIRALAX / GLYCOLAX) 17 g packet Take 17 g by mouth daily as needed for mild constipation.   potassium chloride (MICRO-K) 10 MEQ CR capsule TAKE ONE CAPSULE BY MOUTH ONCE DAILY   pravastatin (PRAVACHOL) 20 MG tablet Take 1 tablet (20 mg total) by mouth daily.   Probiotic Product (ALIGN) 4 MG CAPS Take 1 capsule (4 mg total) by mouth daily.   senna (SENOKOT) 8.6 MG TABS tablet Take 1 tablet (8.6 mg total) by mouth daily.   sertraline (ZOLOFT) 50 MG tablet Take 1 tablet (50 mg total) by mouth daily.   sitaGLIPtin (JANUVIA) 100 MG tablet TAKE 1/2 TABLET BY MOUTH EVERY DAY   Sodium Phosphates (RA SALINE ENEMA RE) Place rectally. If not relieved by Biscodyl suppository, give disposable Saline Enema rectally X 1 dose/24 hrs as needed (   vitamin E 1000 UNIT capsule Take 1,000 Units by mouth daily.   No facility-administered encounter medications on file as of 09/18/2020.   Contacted [employer]  to follow up on patient assistance application for Eliquis. Per representative at [facility] states they have not received any portion of the application.   Follow-Up:  Patient Assistance Coordination and Pharmacist Review  [name], CPP notified  Margaretmary Dys, [place] Assistant [phone]  I have reviewed the care management and care coordination activities outlined in this encounter and I am certifying that I agree with the content of this note. No further action required.  [name], PharmD Clinical Pharmacist [facility] at [facility] [phone]

## 2020-09-21 ENCOUNTER — Inpatient Hospital Stay (HOSPITAL_COMMUNITY)
Admission: EM | Admit: 2020-09-21 | Discharge: 2020-09-26 | DRG: 092 | Disposition: A | Discharge status: Skilled Nursing Facility | Payer: Medicare Other | Source: Skilled Nursing Facility | Attending: Family Medicine | Admitting: Family Medicine

## 2020-09-21 ENCOUNTER — Encounter (HOSPITAL_COMMUNITY): Payer: Self-pay | Admitting: Emergency Medicine

## 2020-09-21 ENCOUNTER — Emergency Department (HOSPITAL_COMMUNITY): Payer: Medicare Other

## 2020-09-21 ENCOUNTER — Other Ambulatory Visit: Payer: Self-pay

## 2020-09-21 DIAGNOSIS — I130 Hypertensive heart and chronic kidney disease with heart failure and stage 1 through stage 4 chronic kidney disease, or unspecified chronic kidney disease: Secondary | ICD-10-CM | POA: Diagnosis not present

## 2020-09-21 DIAGNOSIS — I4821 Permanent atrial fibrillation: Secondary | ICD-10-CM | POA: Diagnosis not present

## 2020-09-21 DIAGNOSIS — Z8249 Family history of ischemic heart disease and other diseases of the circulatory system: Secondary | ICD-10-CM

## 2020-09-21 DIAGNOSIS — N1831 Chronic kidney disease, stage 3a: Secondary | ICD-10-CM | POA: Diagnosis present

## 2020-09-21 DIAGNOSIS — Z6830 Body mass index (BMI) 30.0-30.9, adult: Secondary | ICD-10-CM

## 2020-09-21 DIAGNOSIS — Z66 Do not resuscitate: Secondary | ICD-10-CM | POA: Diagnosis not present

## 2020-09-21 DIAGNOSIS — Z8711 Personal history of peptic ulcer disease: Secondary | ICD-10-CM

## 2020-09-21 DIAGNOSIS — N390 Urinary tract infection, site not specified: Secondary | ICD-10-CM | POA: Diagnosis present

## 2020-09-21 DIAGNOSIS — J323 Chronic sphenoidal sinusitis: Secondary | ICD-10-CM

## 2020-09-21 DIAGNOSIS — I517 Cardiomegaly: Secondary | ICD-10-CM | POA: Diagnosis not present

## 2020-09-21 DIAGNOSIS — E1169 Type 2 diabetes mellitus with other specified complication: Secondary | ICD-10-CM | POA: Diagnosis present

## 2020-09-21 DIAGNOSIS — T40605A Adverse effect of unspecified narcotics, initial encounter: Secondary | ICD-10-CM | POA: Diagnosis present

## 2020-09-21 DIAGNOSIS — Z79899 Other long term (current) drug therapy: Secondary | ICD-10-CM

## 2020-09-21 DIAGNOSIS — J9611 Chronic respiratory failure with hypoxia: Secondary | ICD-10-CM

## 2020-09-21 DIAGNOSIS — R4182 Altered mental status, unspecified: Secondary | ICD-10-CM | POA: Diagnosis not present

## 2020-09-21 DIAGNOSIS — G8929 Other chronic pain: Secondary | ICD-10-CM | POA: Diagnosis present

## 2020-09-21 DIAGNOSIS — I4892 Unspecified atrial flutter: Secondary | ICD-10-CM | POA: Diagnosis present

## 2020-09-21 DIAGNOSIS — I4891 Unspecified atrial fibrillation: Secondary | ICD-10-CM | POA: Diagnosis present

## 2020-09-21 DIAGNOSIS — Z87440 Personal history of urinary (tract) infections: Secondary | ICD-10-CM | POA: Diagnosis present

## 2020-09-21 DIAGNOSIS — R6510 Systemic inflammatory response syndrome (SIRS) of non-infectious origin without acute organ dysfunction: Secondary | ICD-10-CM | POA: Diagnosis not present

## 2020-09-21 DIAGNOSIS — I252 Old myocardial infarction: Secondary | ICD-10-CM

## 2020-09-21 DIAGNOSIS — Z951 Presence of aortocoronary bypass graft: Secondary | ICD-10-CM

## 2020-09-21 DIAGNOSIS — Z9981 Dependence on supplemental oxygen: Secondary | ICD-10-CM

## 2020-09-21 DIAGNOSIS — Z888 Allergy status to other drugs, medicaments and biological substances status: Secondary | ICD-10-CM

## 2020-09-21 DIAGNOSIS — H9190 Unspecified hearing loss, unspecified ear: Secondary | ICD-10-CM | POA: Diagnosis present

## 2020-09-21 DIAGNOSIS — E1142 Type 2 diabetes mellitus with diabetic polyneuropathy: Secondary | ICD-10-CM | POA: Diagnosis present

## 2020-09-21 DIAGNOSIS — I2510 Atherosclerotic heart disease of native coronary artery without angina pectoris: Secondary | ICD-10-CM | POA: Diagnosis present

## 2020-09-21 DIAGNOSIS — Z87891 Personal history of nicotine dependence: Secondary | ICD-10-CM

## 2020-09-21 DIAGNOSIS — E1151 Type 2 diabetes mellitus with diabetic peripheral angiopathy without gangrene: Secondary | ICD-10-CM | POA: Diagnosis present

## 2020-09-21 DIAGNOSIS — J0130 Acute sphenoidal sinusitis, unspecified: Secondary | ICD-10-CM | POA: Diagnosis present

## 2020-09-21 DIAGNOSIS — F32A Depression, unspecified: Secondary | ICD-10-CM | POA: Diagnosis present

## 2020-09-21 DIAGNOSIS — Z85828 Personal history of other malignant neoplasm of skin: Secondary | ICD-10-CM

## 2020-09-21 DIAGNOSIS — F411 Generalized anxiety disorder: Secondary | ICD-10-CM | POA: Diagnosis present

## 2020-09-21 DIAGNOSIS — K219 Gastro-esophageal reflux disease without esophagitis: Secondary | ICD-10-CM | POA: Diagnosis present

## 2020-09-21 DIAGNOSIS — Z7984 Long term (current) use of oral hypoglycemic drugs: Secondary | ICD-10-CM

## 2020-09-21 DIAGNOSIS — Z833 Family history of diabetes mellitus: Secondary | ICD-10-CM

## 2020-09-21 DIAGNOSIS — M80059D Age-related osteoporosis with current pathological fracture, unspecified femur, subsequent encounter for fracture with routine healing: Secondary | ICD-10-CM

## 2020-09-21 DIAGNOSIS — M810 Age-related osteoporosis without current pathological fracture: Secondary | ICD-10-CM | POA: Diagnosis present

## 2020-09-21 DIAGNOSIS — E876 Hypokalemia: Secondary | ICD-10-CM | POA: Diagnosis present

## 2020-09-21 DIAGNOSIS — J811 Chronic pulmonary edema: Secondary | ICD-10-CM | POA: Diagnosis not present

## 2020-09-21 DIAGNOSIS — R443 Hallucinations, unspecified: Secondary | ICD-10-CM | POA: Diagnosis present

## 2020-09-21 DIAGNOSIS — G928 Other toxic encephalopathy: Secondary | ICD-10-CM | POA: Diagnosis not present

## 2020-09-21 DIAGNOSIS — G4700 Insomnia, unspecified: Secondary | ICD-10-CM | POA: Diagnosis present

## 2020-09-21 DIAGNOSIS — E1159 Type 2 diabetes mellitus with other circulatory complications: Secondary | ICD-10-CM | POA: Diagnosis present

## 2020-09-21 DIAGNOSIS — I482 Chronic atrial fibrillation, unspecified: Secondary | ICD-10-CM | POA: Diagnosis present

## 2020-09-21 DIAGNOSIS — I5032 Chronic diastolic (congestive) heart failure: Secondary | ICD-10-CM | POA: Diagnosis present

## 2020-09-21 DIAGNOSIS — R000 Tachycardia, unspecified: Secondary | ICD-10-CM | POA: Diagnosis present

## 2020-09-21 DIAGNOSIS — R0902 Hypoxemia: Secondary | ICD-10-CM | POA: Diagnosis present

## 2020-09-21 DIAGNOSIS — I152 Hypertension secondary to endocrine disorders: Secondary | ICD-10-CM | POA: Diagnosis present

## 2020-09-21 DIAGNOSIS — E1122 Type 2 diabetes mellitus with diabetic chronic kidney disease: Secondary | ICD-10-CM | POA: Diagnosis present

## 2020-09-21 DIAGNOSIS — Z20822 Contact with and (suspected) exposure to covid-19: Secondary | ICD-10-CM | POA: Diagnosis not present

## 2020-09-21 DIAGNOSIS — Z7901 Long term (current) use of anticoagulants: Secondary | ICD-10-CM

## 2020-09-21 DIAGNOSIS — E669 Obesity, unspecified: Secondary | ICD-10-CM | POA: Diagnosis present

## 2020-09-21 DIAGNOSIS — R8281 Pyuria: Secondary | ICD-10-CM | POA: Diagnosis present

## 2020-09-21 DIAGNOSIS — Z79891 Long term (current) use of opiate analgesic: Secondary | ICD-10-CM

## 2020-09-21 DIAGNOSIS — K5900 Constipation, unspecified: Secondary | ICD-10-CM | POA: Diagnosis present

## 2020-09-21 DIAGNOSIS — M8000XD Age-related osteoporosis with current pathological fracture, unspecified site, subsequent encounter for fracture with routine healing: Secondary | ICD-10-CM | POA: Diagnosis present

## 2020-09-21 DIAGNOSIS — E785 Hyperlipidemia, unspecified: Secondary | ICD-10-CM | POA: Diagnosis present

## 2020-09-21 DIAGNOSIS — D72829 Elevated white blood cell count, unspecified: Secondary | ICD-10-CM | POA: Diagnosis present

## 2020-09-21 HISTORY — DX: Cerebral infarction, unspecified: I63.9

## 2020-09-21 HISTORY — DX: Chronic sphenoidal sinusitis: J32.3

## 2020-09-21 HISTORY — DX: Diverticulitis of small intestine without perforation or abscess without bleeding: K57.12

## 2020-09-21 LAB — COMPREHENSIVE METABOLIC PANEL
ALT: 41 U/L (ref 0–44)
AST: 37 U/L (ref 15–41)
Albumin: 2.7 g/dL — ABNORMAL LOW (ref 3.5–5.0)
Alkaline Phosphatase: 144 U/L — ABNORMAL HIGH (ref 38–126)
Anion gap: 9 (ref 5–15)
BUN: 18 mg/dL (ref 8–23)
CO2: 32 mmol/L (ref 22–32)
Calcium: 9.1 mg/dL (ref 8.9–10.3)
Chloride: 98 mmol/L (ref 98–111)
Creatinine, Ser: 1 mg/dL (ref 0.44–1.00)
GFR, Estimated: 53 mL/min — ABNORMAL LOW (ref >60)
Glucose, Bld: 173 mg/dL — ABNORMAL HIGH (ref 70–99)
Potassium: 3.6 mmol/L (ref 3.5–5.1)
Sodium: 139 mmol/L (ref 135–145)
Total Bilirubin: 1.9 mg/dL — ABNORMAL HIGH (ref 0.3–1.2)
Total Protein: 6.9 g/dL (ref 6.5–8.1)

## 2020-09-21 LAB — CBC WITH DIFFERENTIAL/PLATELET
Abs Immature Granulocytes: 0.08 10*3/uL — ABNORMAL HIGH (ref 0.00–0.07)
Basophils Absolute: 0.1 10*3/uL (ref 0.0–0.1)
Basophils Relative: 1 %
Eosinophils Absolute: 0.2 10*3/uL (ref 0.0–0.5)
Eosinophils Relative: 2 %
HCT: 38.4 % (ref 36.0–46.0)
Hemoglobin: 11.6 g/dL — ABNORMAL LOW (ref 12.0–15.0)
Immature Granulocytes: 1 %
Lymphocytes Relative: 8 %
Lymphs Abs: 1 10*3/uL (ref 0.7–4.0)
MCH: 28.9 pg (ref 26.0–34.0)
MCHC: 30.2 g/dL (ref 30.0–36.0)
MCV: 95.5 fL (ref 80.0–100.0)
Monocytes Absolute: 1.1 10*3/uL — ABNORMAL HIGH (ref 0.1–1.0)
Monocytes Relative: 8 %
Neutro Abs: 10.6 10*3/uL — ABNORMAL HIGH (ref 1.7–7.7)
Neutrophils Relative %: 80 %
Platelets: 408 10*3/uL — ABNORMAL HIGH (ref 150–400)
RBC: 4.02 MIL/uL (ref 3.87–5.11)
RDW: 16.1 % — ABNORMAL HIGH (ref 11.5–15.5)
WBC: 13.1 10*3/uL — ABNORMAL HIGH (ref 4.0–10.5)
nRBC: 0 % (ref 0.0–0.2)

## 2020-09-21 LAB — URINALYSIS, ROUTINE W REFLEX MICROSCOPIC
Bilirubin Urine: NEGATIVE
Glucose, UA: NEGATIVE mg/dL
Hgb urine dipstick: NEGATIVE
Ketones, ur: NEGATIVE mg/dL
Nitrite: NEGATIVE
Protein, ur: NEGATIVE mg/dL
Specific Gravity, Urine: 1.011 (ref 1.005–1.030)
WBC, UA: >50 WBC/hpf — ABNORMAL HIGH (ref 0–5)
pH: 6 (ref 5.0–8.0)

## 2020-09-21 LAB — PROTIME-INR
INR: 2 — ABNORMAL HIGH (ref 0.8–1.2)
Prothrombin Time: 22.3 seconds — ABNORMAL HIGH (ref 11.4–15.2)

## 2020-09-21 LAB — LACTIC ACID, PLASMA
Lactic Acid, Venous: 1.3 mmol/L (ref 0.5–1.9)
Lactic Acid, Venous: 1.5 mmol/L (ref 0.5–1.9)

## 2020-09-21 LAB — APTT: aPTT: 32 seconds (ref 24–36)

## 2020-09-21 LAB — CBG MONITORING, ED: Glucose-Capillary: 131 mg/dL — ABNORMAL HIGH (ref 70–99)

## 2020-09-21 MED ORDER — DILTIAZEM HCL 25 MG/5ML IV SOLN
10.0000 mg | Freq: Once | INTRAVENOUS | Status: AC
  Administered 2020-09-22: 10 mg via INTRAVENOUS
  Filled 2020-09-21: qty 5

## 2020-09-21 MED ORDER — INSULIN ASPART 100 UNIT/ML IJ SOLN
0.0000 [IU] | Freq: Three times a day (TID) | INTRAMUSCULAR | Status: DC
  Administered 2020-09-21: 1 [IU] via SUBCUTANEOUS
  Administered 2020-09-22: 2 [IU] via SUBCUTANEOUS
  Administered 2020-09-22 – 2020-09-23 (×3): 1 [IU] via SUBCUTANEOUS
  Administered 2020-09-23: 3 [IU] via SUBCUTANEOUS
  Administered 2020-09-24: 2 [IU] via SUBCUTANEOUS
  Administered 2020-09-24: 1 [IU] via SUBCUTANEOUS
  Administered 2020-09-25: 2 [IU] via SUBCUTANEOUS

## 2020-09-21 MED ORDER — SODIUM CHLORIDE 0.9 % IV SOLN: 2.0000 g | Freq: Once | INTRAVENOUS | Status: DC

## 2020-09-21 MED ORDER — SODIUM CHLORIDE 0.9 % IV SOLN
2.0000 g | Freq: Once | INTRAVENOUS | Status: AC
  Administered 2020-09-21: 2 g via INTRAVENOUS
  Filled 2020-09-21: qty 2

## 2020-09-21 MED ORDER — LACTATED RINGERS IV SOLN: INTRAVENOUS | Status: AC

## 2020-09-21 MED ORDER — METOPROLOL TARTRATE 5 MG/5ML IV SOLN
5.0000 mg | Freq: Four times a day (QID) | INTRAVENOUS | Status: DC
  Administered 2020-09-21: 5 mg via INTRAVENOUS
  Filled 2020-09-21 (×2): qty 5

## 2020-09-21 MED ORDER — VANCOMYCIN HCL 750 MG/150ML IV SOLN
750.0000 mg | INTRAVENOUS | Status: DC
  Administered 2020-09-22: 750 mg via INTRAVENOUS
  Filled 2020-09-21 (×3): qty 150

## 2020-09-21 MED ORDER — METRONIDAZOLE 500 MG/100ML IV SOLN
500.0000 mg | Freq: Once | INTRAVENOUS | Status: AC
  Administered 2020-09-21: 500 mg via INTRAVENOUS
  Filled 2020-09-21: qty 100

## 2020-09-21 MED ORDER — SODIUM CHLORIDE 0.9 % IV SOLN
2.0000 g | INTRAVENOUS | Status: AC
  Administered 2020-09-22 – 2020-09-23 (×2): 2 g via INTRAVENOUS
  Filled 2020-09-21 (×2): qty 2

## 2020-09-21 MED ORDER — LACTATED RINGERS IV BOLUS (SEPSIS)
1000.0000 mL | Freq: Once | INTRAVENOUS | Status: AC
  Administered 2020-09-21: 1000 mL via INTRAVENOUS

## 2020-09-21 MED ORDER — VANCOMYCIN HCL 1250 MG/250ML IV SOLN
1250.0000 mg | Freq: Once | INTRAVENOUS | Status: AC
  Administered 2020-09-21: 1250 mg via INTRAVENOUS
  Filled 2020-09-21: qty 250

## 2020-09-21 NOTE — Progress Notes (Signed)
FPTS Interim Progress Note  S: Reported to bedside in order to evaluate patient for evening shift. Patient was initially sleeping but wakes easily to voice.  Patient's granddaughter, [name], was present at bedside as well.  Patient denies current pain.  Patient states her name, home address and the name of her significant other.  She reports that she is in the hospital at [facility].  She reports that her reason for being in hospital is because she "broke my hip".  Patient states that her breathing feels normal. Patient's granddaughter reviewed history of presenting illness.  She reports that her grandmother appears to be more alert and conversant in comparison to around 11 AM today.  She is happy to see her progress throughout the day.  Patient's granddaughter inquires as to whether she can stay overnight with the patient due to her altered mental status.  O: BP 123/70   Pulse (!) 106   Temp 98.1 F (36.7 C) (Oral)   Resp (!) 21   SpO2 96%   General: Elderly female, lying in bed supine, does not appear to be in acute distress Cardiac: Tachycardic, irregularly irregular Pulmonary: No signs of respiratory distress, stable on 2 L via nasal cannula Neuro: Patient alert and oriented to self, location, able to state home address, able to give the name of her significant other, patient able to follow commands, patient hard of hearing, does not have hearing aid available  A/P: Patient admitted for altered mental status with concern for iatrogenic causes versus infectious causes.  Patient was initially somnolent but appears to be more alert and conversant than initial exam at the time of admission.  Altered mental status: Awaiting results of UDS.  Patient currently on antibiotic therapy including cefepime. S/p metronidazole and vancomycin.   Atrial fibrillation: heart rate continues to remain elevated ranging from 120s up to 130s while at the bedside, continue metoprolol IV 5 mg every 6 hours, cardiac  monitoring continued. Will monitor HR.   Eulis Foster, MD 09/21/2020, 10:32 PM PGY-2, [facility] pager [phone]

## 2020-09-21 NOTE — Progress Notes (Signed)
Pharmacy Antibiotic Note  [name] is a 87 y.o. female admitted on 09/21/2020 with sepsis.  Pharmacy has been consulted for cefepime and vanc dosing.  Patient has history of UTI, no clear infective source here in the ED. Has mild leukocytosis, LA 1.3, and is afebrile  Plan: Cefepime 2g q24hr Vanc 1250mg  x1 then vancomycin 750mg  q24hr Follow up cultures, clinical status and source of infection, UA, CXR     Temp (24hrs), Avg:98.1 F (36.7 C), Min:98.1 F (36.7 C), Max:98.1 F (36.7 C)  Recent Labs  Lab 09/21/20 1222  WBC 13.1*  CREATININE 1.00  LATICACIDVEN 1.3    Estimated Creatinine Clearance: 27 mL/min (by C-G formula based on SCr of 1 mg/dL).    Allergies  Allergen Reactions   Metformin And Related Diarrhea    Antimicrobials this admission: Vanc 6/20 >> Cefepime 6/20 >>  Dose adjustments this admission: NA  Microbiology results: 6/20 BCx: collected prior to abx 6/20 UCx: pending   Thank you for allowing pharmacy to be a part of this patient's care.  Norina Buzzard, PharmD PGY1 Pharmacy Resident 09/21/2020 2:43 PM

## 2020-09-21 NOTE — ED Provider Notes (Signed)
[name] EMERGENCY DEPARTMENT Provider Note   CSN: [id number] Arrival date & time: 09/21/20  1033     History Chief Complaint  Patient presents with   Altered Mental Status    [name] is a 87 y.o. female.  HPI Patient status post femoral neck hip fracture with ORIF with intramedullary nail on 6\6\2022.  Patient resumed Eliquis on 6\7.  Patient has been placed at SNF for rehab for the past 7 days.  Patient's family member reports the patient was doing fairly well.  She had a clear mental status.  They do report she had occasional hallucinations which she recognized.  These include sensation of falling or things on the floor or ants on the wall.  Her family member reports that on Saturday, her mental status was very clear and they were having good conversations.  She reports on Sunday there was a very large change and the patient was extremely confused and somnolent and hard to communicate with.  She reports this has continued today and is been like night and day.  She hardly wakes up.  No fever has been reported.  No vomiting or diarrhea has been reported.  She reports the patient was eating small amounts and taking fluids before all this started.  She does report the patient's been prone to UTIs in the past.  She has previously experienced confusion with UTIs.  Also, she suffers from significant anxiety with her atrial fibrillation.  Patient does get Xanax for her anxiety.  She also had been started on some pain medication for her hip.  Family members not sure if she might be oversedated or possibly got the wrong medications.  She reports on Saturday a caregiver had come in the room getting ready to give her medications that were for the wrong patient.  Patient extremely obtunded and somnolent.  She cannot give any history at this time.    Past Medical History:  Diagnosis Date   Actinic keratosis 01/22/2020   right forearm   Acute deep vein thrombosis (DVT) of  distal vein of right lower extremity ([place]) 12/2013   Acute thrombus of R proximal gastrocnemius vein. Symptomatic provoked distal DVT   Anxiety    Basal cell carcinoma 10/30/2018   R nasal bridge, treated with Moh's   Basal cell carcinoma 01/22/2020   left forearm, nodular   Cellulitis of leg, left 01/16/2014   CHF (congestive heart failure) (HCC)    Chronic back pain 03/2012   lumbar DDD with herniation - s/p L S1 transforaminal ESI (10/2012 Dr. Sharlet Salina)   Chronic venous insufficiency    Coronary artery disease    CABG 1998   Cystocele    Depression    Diabetes mellitus    type 2, followed by endo.   Diastolic dysfunction    Diverticulosis    Hemorrhoids    History of heart attack    Hx of echocardiogram    a. Echo 10/13: mild LVH, EF 55-60%, Gr 1 diast dysfn, PASP 32   Hyperlipidemia    Hypertension    Irritable bowel syndrome    Microcytic anemia    PAD (peripheral artery disease) ([place]) 2013   ABI: R 0.91, L 0.83   Postmenopausal osteoporosis 01/2019   T score of -2.6   PUD (peptic ulcer disease)    Varicose veins    Venous ulcer of leg ([place]) 12/12/2013   Required prolonged treatment by wound center   Viral gastroenteritis due to [place] virus 05/13/2015  hospitalization    Patient Active Problem List   Diagnosis Date Noted   Postoperative anemia due to acute blood loss 09/13/2020   Oxygen desaturation    Displaced intertrochanteric fracture of right femur, initial encounter for closed fracture ([place]) 09/05/2020   Financial difficulty 02/26/2020   UTI (urinary tract infection) 10/01/2019   CKD stage 3 due to type 2 diabetes mellitus ([place]) 09/02/2019   History of CVA in adulthood 08/23/2019   Lethargy 07/27/2019   Polypharmacy 07/27/2019   Pneumonia 02/15/2019   E coli bacteremia 02/13/2019   Dyspnea    Diverticulitis of duodenum    Duodenal ulcer perforation ([place]) 02/08/2019   Chronic leg pain 11/14/2018   Atherosclerosis of native arteries of the extremities  with ulceration ([place]) 08/21/2018   Chronic constipation 06/18/2018   Erythema of breast 04/18/2018   MRSA cellulitis 03/31/2018   Advanced directives, counseling/discussion 01/29/2018   Bilateral hearing loss 01/29/2018   Spinal stenosis of lumbosacral region 57/84/6962   Systolic murmur 95/28/4132   De Quervain's tenosynovitis, left 01/09/2017   Encounter for chronic pain management 02/09/2016   Paroxysmal atrial fibrillation (HCC)    Varicose veins of left lower extremity with inflammation, with ulcer of calf limited to breakdown of skin ([place]) 05/22/2015   Varicose veins of right lower extremity with inflammation 01/23/2015   Diabetes mellitus type 2 with peripheral artery disease (HCC)    Chronic venous insufficiency    Bleeding from varicose veins of lower extremity, left 02/13/2014   Diabetic polyneuropathy ([place]) 11/19/2013   Type 2 diabetes mellitus with neurological manifestations, controlled ([place]) 11/19/2013   Right shoulder pain 08/19/2013   Cerumen impaction 04/16/2013   Abdominal pain 05/14/2012   Peptic ulcer disease    Unsteady gait 12/13/2011   Chronic lower back pain 09/14/2011   Hyperlipidemia associated with type 2 diabetes mellitus ([place]) 09/06/2010   GAD (generalized anxiety disorder) 03/15/2010   Obesity, Class I, BMI 30-34.9 03/15/2010   Hypertension 03/15/2010   (HFpEF) heart failure with preserved ejection fraction ([place]) 03/15/2010   Coronary artery disease     Past Surgical History:  Procedure Laterality Date   ABDOMINAL HYSTERECTOMY  1975   TAH,BSO   ABI  2013   R 0.91, L 0.83   APPENDECTOMY     per pt report   CARDIOVASCULAR STRESS TEST  2015   low risk myoview (Nahser)   CATARACT SURGERY     CHOLECYSTECTOMY     COLONOSCOPY  02/2010   extensive diverticulosis throughout, small internal hemorrhoids, rec rpt 5 yrs (Dr. Allyn Kenner)   [place]   dexa scan  09/2011   femur -0.8, forearm 0.6 => normal   ENDOVENOUS ABLATION  SAPHENOUS VEIN W/ LASER Left 03/2014   Kellie Simmering   ESI  2014   L S1 transforaminal ESI x2 ([place]) without improvement   HAND SURGERY     INTRAMEDULLARY (IM) NAIL INTERTROCHANTERIC Right 09/07/2020   Procedure: INTRAMEDULLARY (IM) NAIL INTERTROCHANTRIC;  Surgeon: Leandrew Koyanagi, MD;  Location: [facility];  Service: Orthopedics;  Laterality: Right;   lexiscan myoview  03/2011   Small basal inferolateral and anterolateral reversible perfusion defect suggests ischemia.  EF was normal.   old infarct, no new ischemia   LOWER EXTREMITY ANGIOGRAPHY Right 09/06/2018   Procedure: LOWER EXTREMITY ANGIOGRAPHY;  Surgeon: Algernon Huxley, MD;  Location: [facility];  Service: Cardiovascular;  Laterality: Right;   toe surgery     TYMPANOPLASTY  1960s   R side  Vault prolapse A & P repair  2009   [facility]     OB History     Gravida  4   Para  4   Term  2   Preterm      AB      Living  1      SAB      IAB      Ectopic      Multiple      Live Births              Family History  Problem Relation Age of Onset   Hypertension Mother    Stroke Mother    Diabetes Father    Diabetes Sister    Diabetes Brother    Heart disease Son    Colon cancer Neg Hx     Social History   Tobacco Use   Smoking status: Former    Pack years: 0.00    Types: Cigarettes    Quit date: 03/05/1979    Years since quitting: 41.5   Smokeless tobacco: Never  Vaping Use   Vaping Use: Never used  Substance Use Topics   Alcohol use: No    Alcohol/week: 0.0 standard drinks   Drug use: No    Home Medications Prior to Admission medications   Medication Sig Start Date End Date Taking? Authorizing Provider  B Complex-C (B-COMPLEX WITH VITAMIN C) tablet Take 1 tablet by mouth daily.   Yes [provider]  B COMPLEX-C-BIOTIN-D-ZINC-FA PO Take 1 tablet by mouth daily.   Yes [provider]  ALPRAZolam Duanne Moron) 1 MG tablet Take 0.5 tablets (0.5 mg total) by mouth at  bedtime as needed for anxiety. 09/14/20   Maness, Arnette Norris, MD  alum & mag hydroxide-simeth (MAALOX/MYLANTA) 200-200-20 MG/5ML suspension Take 30 mLs by mouth every 4 (four) hours as needed for indigestion. 09/11/20   Maness, Arnette Norris, MD  bisacodyl (DULCOLAX) 10 MG suppository Place 10 mg rectally as needed for moderate constipation. : If not relieved by MOM, give 10 mg Bisacodyl suppositiory rectally X 1 dose in 24 hours as needed    [provider]  diclofenac sodium (VOLTAREN) 1 % GEL Apply 1 application topically 3 (three) times daily. 01/09/17   Ria Bush, MD  DILT-XR 120 MG 24 hr capsule TAKE 1 CAPSULE BY MOUTH EVERY DAY 04/09/20   Ria Bush, MD  ELIQUIS 5 MG TABS tablet TAKE 1 TABLET BY MOUTH TWICE A DAY 04/09/20   Nahser, Wonda Cheng, MD  famotidine (PEPCID) 20 MG tablet Take 1 tablet (20 mg total) by mouth daily as needed for heartburn (breakthrough heartburn). 07/30/19   Ria Bush, MD  fluticasone [facility]) 50 MCG/ACT nasal spray Place 1 spray into both nostrils daily as needed for allergies. 09/23/19   [provider]  furosemide (LASIX) 40 MG tablet Take 1 tablet (40 mg total) by mouth 2 (two) times daily. 09/11/20   Maness, Arnette Norris, MD  glucosamine-chondroitin 500-400 MG tablet Take 1 tablet by mouth daily.    [provider]  HYDROcodone-acetaminophen (NORCO/VICODIN) 5-325 MG tablet Take 1 tablet by mouth every 6 (six) hours as needed for moderate pain or severe pain. 09/16/20   Medina-Vargas, Monina C, NP  Lancets (ONETOUCH DELICA PLUS [id number]) MISC Use to test blood sugar once daily dx code E11.9 04/08/20   Elayne Snare, MD  loratadine (CLARITIN) 10 MG tablet Take 10 mg by mouth daily as needed for allergies.    [provider]  Magnesium  Hydroxide (MILK OF MAGNESIA PO) Take 30 mLs by mouth. If no BM in 3 days, give 30 cc Milk of Magnesium p.o. x 1 dose in 24 hours as needed    [provider]  methocarbamol (ROBAXIN) 500 MG tablet  Take 1 tablet (500 mg total) by mouth every 6 (six) hours as needed for muscle spasms. 09/11/20   Maness, Arnette Norris, MD  metoprolol tartrate (LOPRESSOR) 50 MG tablet TAKE 1 TABLET BY MOUTH TWICE A DAY 04/09/20   Ria Bush, MD  Multiple Vitamins-Minerals (ICAPS) CAPS Take 1 capsule by mouth daily.    [provider]  nitroGLYCERIN (NITROSTAT) 0.4 MG SL tablet Place 1 tablet (0.4 mg total) under the tongue every 5 (five) minutes as needed for chest pain. 02/02/17   Nahser, Wonda Cheng, MD  nystatin ointment (MYCOSTATIN) APPLY TO AFFECTED AREA TWICE DAILY 07/03/20   Ria Bush, MD  ondansetron (ZOFRAN) 4 MG tablet Take 1 tablet (4 mg total) by mouth every 6 (six) hours as needed for nausea. 09/11/20   Maness, Arnette Norris, MD  [facility] VERIO test strip USE TO TEST BLOOD SUGAR ONCE DAILY. DX:E11.9 04/09/20   Elayne Snare, MD  pantoprazole (PROTONIX) 40 MG tablet TAKE 1 TABLET BY MOUTH TWICE A DAY 04/09/20   Ria Bush, MD  pioglitazone (ACTOS) 15 MG tablet TAKE 1 TABLET BY MOUTH EVERY DAY 04/09/20   Ria Bush, MD  polyethylene glycol (MIRALAX / GLYCOLAX) 17 g packet Take 17 g by mouth daily as needed for mild constipation. 09/11/20   Maness, Arnette Norris, MD  potassium chloride (MICRO-K) 10 MEQ CR capsule TAKE ONE CAPSULE BY MOUTH ONCE DAILY 08/21/20   Ria Bush, MD  pravastatin (PRAVACHOL) 20 MG tablet Take 1 tablet (20 mg total) by mouth daily. 05/27/20   Ria Bush, MD  Probiotic Product (ALIGN) 4 MG CAPS Take 1 capsule (4 mg total) by mouth daily. 08/15/17   Ria Bush, MD  senna (SENOKOT) 8.6 MG TABS tablet Take 1 tablet (8.6 mg total) by mouth daily. 09/12/20   Maness, Arnette Norris, MD  sertraline (ZOLOFT) 50 MG tablet Take 1 tablet (50 mg total) by mouth daily. 08/28/20   Ria Bush, MD  sitaGLIPtin (JANUVIA) 100 MG tablet TAKE 1/2 TABLET BY MOUTH EVERY DAY 04/16/20   Elayne Snare, MD  Sodium Phosphates (RA SALINE ENEMA RE) Place rectally. If not relieved by Biscodyl  suppository, give disposable Saline Enema rectally X 1 dose/24 hrs as needed (    [provider]  vitamin E 1000 UNIT capsule Take 1,000 Units by mouth daily.    [provider]    Allergies    Metformin and related  Review of Systems   Review of Systems 10 systems reviewed and negative except as per HPI Physical Exam Updated Vital Signs BP (!) 122/105   Pulse 71   Temp 98.1 F (36.7 C) (Oral)   Resp 20   SpO2 100%   Physical Exam Constitutional:      Comments: Extremely somnolent.  No respiratory distress.  HENT:     Head: Normocephalic and atraumatic.     Nose: Nose normal.     Mouth/Throat:     Comments: Mucous membranes dry. Eyes:     Pupils: Pupils are equal, round, and reactive to light.  Cardiovascular:     Rate and Rhythm: Tachycardia present. Rhythm irregular.  Pulmonary:     Comments: No respiratory distress.  Patient does not cooperate to make effort for deep inspiration.  No gross rhonchi or wheeze  appreciable. Abdominal:     Comments: Abdomen is soft and pliable.  No guarding.  Mild candidal rash beneath the left breast.  Other skin folds are clean.  Musculoskeletal:     Comments: Patient has clean dressing over right hip replacement wound sites.  No large amount of erythema or swelling or cellulitic changes around the dressings.  The lower legs are in good condition without edema or significant swelling.  Skin:    General: Skin is warm and dry.  Neurological:     Comments: Patient is obtunded.  She is sleeping.  With stimulus some leaning her out forward and saying her name loudly, she will open her eyes and gaze around a bit.  She does not really follow any commands.    ED Results / Procedures / Treatments   Labs (all labs ordered are listed, but only abnormal results are displayed) Labs Reviewed  COMPREHENSIVE METABOLIC PANEL - Abnormal; Notable for the following components:      Result Value   Glucose, Bld 173 (*)    Albumin 2.7  (*)    Alkaline Phosphatase 144 (*)    Total Bilirubin 1.9 (*)    GFR, Estimated 53 (*)    All other components within normal limits  CBC WITH DIFFERENTIAL/PLATELET - Abnormal; Notable for the following components:   WBC 13.1 (*)    Hemoglobin 11.6 (*)    RDW 16.1 (*)    Platelets 408 (*)    Neutro Abs 10.6 (*)    Monocytes Absolute 1.1 (*)    Abs Immature Granulocytes 0.08 (*)    All other components within normal limits  PROTIME-INR - Abnormal; Notable for the following components:   Prothrombin Time 22.3 (*)    INR 2.0 (*)    All other components within normal limits  CULTURE, BLOOD (SINGLE)  URINE CULTURE  RESP PANEL BY RT-PCR (FLU A&B, COVID) [id number]  LACTIC ACID, PLASMA  APTT  LACTIC ACID, PLASMA  URINALYSIS, ROUTINE W REFLEX MICROSCOPIC    EKG EKG Interpretation  Date/Time:  Monday September 21 2020 10:38:31 EDT Ventricular Rate:  112 PR Interval:    QRS Duration: 88 QT Interval:  350 QTC Calculation: 478 R Axis:   32 Text Interpretation: Atrial fibrillation Borderline repolarization abnormality agree, similar to previuos Confirmed by Charlesetta Shanks [phone]) on 09/21/2020 11:53:46 AM  Radiology CT Head Wo Contrast  Result Date: 09/21/2020 CLINICAL DATA:  87 year old female with altered mental status EXAM: CT HEAD WITHOUT CONTRAST TECHNIQUE: Contiguous axial images were obtained from the base of the skull through the vertex without intravenous contrast. COMPARISON:  08/08/2019 CT and prior studies FINDINGS: Brain: No evidence of acute infarction, hemorrhage, hydrocephalus, extra-axial collection or mass lesion/mass effect. Atrophy, chronic small-vessel white matter ischemic changes and remote LEFT frontoparietal white matter infarct noted. Vascular: Carotid and vertebral atherosclerotic calcifications are noted. Skull: No acute abnormality. Sinuses/Orbits: New fluid within the RIGHT sphenoid sinus is noted. Other: None IMPRESSION: 1. No evidence of acute intracranial  abnormality. 2. Atrophy, chronic small-vessel white matter ischemic changes and remote LEFT frontoparietal white matter infarct. 3. New RIGHT sphenoid sinus fluid/acute sinusitis. Electronically Signed   By: Margarette Canada M.D.   On: 09/21/2020 13:59   DG Chest Port 1 View  Result Date: 09/21/2020 CLINICAL DATA:  Possible sepsis EXAM: PORTABLE CHEST 1 VIEW COMPARISON:  09/11/2020 FINDINGS: Mild bilateral interstitial thickening. No focal consolidation. No pleural effusion or pneumothorax. Stable cardiomegaly. Prior CABG. No acute osseous abnormality. IMPRESSION: Cardiomegaly with pulmonary vascular congestion.  Electronically Signed   By: Kathreen Devoid   On: 09/21/2020 12:46    Procedures Procedures  CRITICAL CARE Performed by: Charlesetta Shanks   Total critical care time: 45 minutes  Critical care time was exclusive of separately billable procedures and treating other patients.  Critical care was necessary to treat or prevent imminent or life-threatening deterioration.  Critical care was time spent personally by me on the following activities: development of treatment plan with patient and/or surrogate as well as nursing, discussions with consultants, evaluation of patient's response to treatment, examination of patient, obtaining history from patient or surrogate, ordering and performing treatments and interventions, ordering and review of laboratory studies, ordering and review of radiographic studies, pulse oximetry and re-evaluation of patient's condition.  Medications Ordered in ED Medications  lactated ringers infusion (has no administration in time range)  lactated ringers bolus 1,000 mL (has no administration in time range)  ceFEPIme (MAXIPIME) 2 g in sodium chloride 0.9 % 100 mL IVPB (2 g Intravenous New Bag/Given 09/21/20 1437)  metroNIDAZOLE (FLAGYL) IVPB 500 mg (has no administration in time range)  vancomycin (VANCOREADY) IVPB 1250 mg/250 mL (has no administration in time range)   vancomycin (VANCOREADY) IVPB 750 mg/150 mL (has no administration in time range)  ceFEPIme (MAXIPIME) 2 g in sodium chloride 0.9 % 100 mL IVPB (has no administration in time range)  lactated ringers bolus 1,000 mL (0 mLs Intravenous Stopped 09/21/20 1404)    ED Course  I have reviewed the triage vital signs and the nursing notes.  Pertinent labs & imaging results that were available during my care of the patient were reviewed by me and considered in my medical decision making (see chart for details).  Clinical Course as of 09/21/20 1453  Mon Sep 21, 2020  1452 Consult: Family practice for admission. [MP]    Clinical Course User Index [MP] Charlesetta Shanks, MD   MDM Rules/Calculators/A&P                          Patient presents somnolent and obtunded.  She is protecting her airway.  She does not have significant hypoxia.  Differential includes metabolic encephalopathy, infectious etiology with altered mental status, cumulative effects of sedating medications including Xanax and/or pain medications administered after the patient's hip surgery.  Patient meets SIRS criteria.  Sepsis protocol initiated.    Have reviewed the patient's CODE STATUS with the patient's granddaughter.  Patient's granddaughter reports that they had a conversation when the patient was lucid several days ago and agrees that she does not want resuscitation.  Patient did not want heroic measures.  She would want antibiotics and fluids for resuscitation but no intubation, chest compressions or shock. Final Clinical Impression(s) / ED Diagnoses Final diagnoses:  Altered mental status, unspecified altered mental status type  SIRS (systemic inflammatory response syndrome) ([place])    Rx / DC Orders ED Discharge Orders     None        Charlesetta Shanks, MD 09/21/20 1455

## 2020-09-21 NOTE — ED Notes (Addendum)
Lopressor placed on hold d/t trending low BP. Last BP 96/65. Secure message sent to [place], resident, family medicine.

## 2020-09-21 NOTE — H&P (Addendum)
[name] Admission History and Physical Service Pager: [phone]  Patient name: [name] Medical record number: [id number] Date of birth: [birth year] Age: 87 y.o. Gender: female  Primary Care Provider: Ria Bush, MD Consultants: None Code Status: DNR which was confirmed with [name]  Preferred Emergency Contact: [name] (granddaughter) [phone]  Chief Complaint: AMS  Assessment and Plan: [name] is a 87 y.o. female presenting with Altered mental status. PMH is significant for T2DM, HLD, HFpEF (EF 50-55%), depression/anxiety, GERD, CAD, CKD III, h/o skin cancer, chronic pain 2/2 spinal stenosis,  right femoral neck fracture s/p intramedullary nail.    AMS w/ concern for sepsis, etiology unknown Patient presenting from SNF for altered mental status.  Currently obtunded and minimally responsive to questions.  Patient has occasional confusion at baseline, but started becoming more confused yesterday and had some hallucinations per daughter.  Currently tachycardic with soft BP's.  Otherwise hemodynamically stable Awakens to voice and sternal rub, follows commands with prompting, able to move both feet and squeeze fingers with hands.  Meets SIRS criteria given AMS and tachycardia.  Started on Vanc, Cefepime and Flagyl in ED.  Received 2 fluid boli and started on mIVF LR.  Chest X-Ray negative for pneumonia.   No significant electrolyte abnormalities and glucose 173.  Lactic acid 1.3 UA shows possible UTI.  Ucx and BCX pending.  Also follow-up UDS.  Patient on Hydrocone 5 q6hr PRN, Robaxin q6hr PRN andand Xanax 0.5 mg at night.  Family expressed concern that patient not receiving appropriate medications at SNF.  Patient presentation fitting with possible overdosing of sedating medications.  No prior history of seizure and no witnessed seizure like activity.  CT Head unchanged from previous. Stroke less likely at this time. Holding off on  further imaging of MRI, EEG at this time though can consider if neurologic status change.  Plan to admit for observation and monitoring of mental status/treatment for infection. - Admit to [name], Attending [name] - VS per unit - Continue IV Antibiotics - VS per unit - Cardiac Telemetry - Cotinuous Pulse Ox - mIVF LR - follow up UDS - follow up Ucx, Bcx - Holding home PO medications until more alert  Right femoral neck fracture s/p ORIF on 09/07/20 Patient admitted with right femoral neck fracture after fall. Orthopedics was consulted and patient underwent ORIF with intramedullary nail on 09/07/20.  PT/OT saw patient afterward and recommended SNF placement.  Patient discharged to SNF.  On Norco-Vicodin 5-325 q6hr. - Hold home Opioids until more alert  Atrial Fibrillation on chronic anticoagulation Currently in a.fib with RVR. Current medications include: [name] 5mg  BID, Lopressor 50mg  BID, and diltiazem 120 mg daily.  Did not receive medications this AM. - Monitor telemetry - Start Metoprolol IV 5 mg q6hr (curbsided pharmacy) - Hold home [name] & Diltiazem   T2DM: Chronic, stable Last A1C 7.3 (08/28/20).  CBG 173 on admit.  Home meds: Januvia 50mg  QD and Pioglitazone 15mg  QD. - Hold home meds - sSSI - CBG q4hr   HTN SBP's 90's-120's, DBP's 60-90's. Home meds: Metoprolol 50mg  BID, diltiazem 120 mg, [name] 40 mg BID - Hold home medications in setting of somnolence and soft blood pressures - Continue to monitor BP   HLD Home meds: Pravastatin 20mg  QD. - Hold home medications until less somnolent   GERD  H/o PUD On Protonix 40 mg at home.   - Hold home medications until less somnolent   Depression/GAD Home meds: [name] 50mg   QD and Xanax 0.5mg  qhs PRN.  Patient very anxious in regards to falls and dispo last admission. - Hold home medications until less somnolent   HFpEF  CAD s/p CABG in [name] with chronic SOB, on 2L since last admission.  She follows with [name].  Manley [name], cardiology [name], and most recently seen in 06/2020.  Last echo (09/02/19) with EF 50-55%.  Holding Home [name]   - Continue home metoprolol - Monitor telemetry/if any symptoms   CKD IIIa Cr 1.00, at baseline 1.0-1.2. - AM BMP   FEN/GI: Heart Healthy diet once able to eat Prophylaxis: SCD's, restart [name] when less somnolent  Disposition: Cards/Tele  History of Present Illness:  [name] is a 87 y.o. female presenting with AMS for 2 days.   Daughter states she was in her usual state of health Saturday. When family members visited Sunday she was not acting like herself. She continues to have hallucinations and appears sleepy. Daughter agrees she has never seen her like this. Has hip pain and complaining of burning chest pain yesterday. Question of her getting another patients medication at the facility or is over medicated. Nursing staff walking in room calling patient by another patient's name. Daughter is unaware of her medications at this facility. Eating and drinking ok.  Review Of Systems: Per HPI with the following additions:  Review of Systems  Constitutional:  Positive for activity change. Negative for fever.  Respiratory:  Negative for shortness of breath.   Cardiovascular:  Positive for chest pain.  Gastrointestinal:  Negative for vomiting.  Psychiatric/Behavioral:  Positive for confusion and hallucinations.     Patient Active Problem List   Diagnosis Date Noted   Altered mental status, unspecified 09/21/2020   Postoperative anemia due to acute blood loss 09/13/2020   Oxygen desaturation    Displaced intertrochanteric fracture of right femur, initial encounter for closed fracture ([place]) 09/05/2020   Financial difficulty 02/26/2020   UTI (urinary tract infection) 10/01/2019   CKD stage 3 due to type 2 diabetes mellitus ([place]) 09/02/2019   History of CVA in adulthood 08/23/2019   Lethargy 07/27/2019   Polypharmacy 07/27/2019   Pneumonia 02/15/2019   E coli  bacteremia 02/13/2019   Dyspnea    Diverticulitis of duodenum    Duodenal ulcer perforation ([place]) 02/08/2019   Chronic leg pain 11/14/2018   Atherosclerosis of native arteries of the extremities with ulceration ([place]) 08/21/2018   Chronic constipation 06/18/2018   Erythema of breast 04/18/2018   MRSA cellulitis 03/31/2018   Advanced directives, counseling/discussion 01/29/2018   Bilateral hearing loss 01/29/2018   Spinal stenosis of lumbosacral region 40/98/1191   Systolic murmur 47/82/9562   De Quervain's tenosynovitis, left 01/09/2017   Encounter for chronic pain management 02/09/2016   Paroxysmal atrial fibrillation (HCC)    Varicose veins of left lower extremity with inflammation, with ulcer of calf limited to breakdown of skin ([place]) 05/22/2015   Varicose veins of right lower extremity with inflammation 01/23/2015   Diabetes mellitus type 2 with peripheral artery disease (HCC)    Chronic venous insufficiency    Bleeding from varicose veins of lower extremity, left 02/13/2014   Diabetic polyneuropathy ([place]) 11/19/2013   Type 2 diabetes mellitus with neurological manifestations, controlled ([place]) 11/19/2013   Right shoulder pain 08/19/2013   Cerumen impaction 04/16/2013   Abdominal pain 05/14/2012   Peptic ulcer disease    Unsteady gait 12/13/2011   Chronic lower back pain 09/14/2011   Hyperlipidemia associated with type 2 diabetes mellitus ([place]) 09/06/2010  GAD (generalized anxiety disorder) 03/15/2010   Obesity, Class I, BMI 30-34.9 03/15/2010   Hypertension 03/15/2010   (HFpEF) heart failure with preserved ejection fraction ([place]) 03/15/2010   Coronary artery disease     Past Medical History: Past Medical History:  Diagnosis Date   Actinic keratosis 01/22/2020   right forearm   Acute deep vein thrombosis (DVT) of distal vein of right lower extremity ([place]) 12/2013   Acute thrombus of R proximal gastrocnemius vein. Symptomatic provoked distal DVT   Anxiety    Basal cell  carcinoma 10/30/2018   R nasal bridge, treated with Moh's   Basal cell carcinoma 01/22/2020   left forearm, nodular   Cellulitis of leg, left 01/16/2014   CHF (congestive heart failure) (HCC)    Chronic back pain 03/2012   lumbar DDD with herniation - s/p L S1 transforaminal ESI (10/2012 [name]. Sharlet [place])   Chronic venous insufficiency    Coronary artery disease    CABG 1998   Cystocele    Depression    Diabetes mellitus    type 2, followed by endo.   Diastolic dysfunction    Diverticulosis    Hemorrhoids    History of heart attack    Hx of echocardiogram    a. Echo 10/13: mild LVH, EF 55-60%, Gr 1 diast dysfn, PASP 32   Hyperlipidemia    Hypertension    Irritable bowel syndrome    Microcytic anemia    PAD (peripheral artery disease) ([place]) 2013   ABI: R 0.91, L 0.83   Postmenopausal osteoporosis 01/2019   T score of -2.6   PUD (peptic ulcer disease)    Varicose veins    Venous ulcer of leg ([place]) 12/12/2013   Required prolonged treatment by wound center   Viral gastroenteritis due to [place] virus 05/13/2015   hospitalization    Past Surgical History: Past Surgical History:  Procedure Laterality Date   ABDOMINAL HYSTERECTOMY  1975   TAH,BSO   ABI  2013   R 0.91, L 0.83   APPENDECTOMY     per pt report   CARDIOVASCULAR STRESS TEST  2015   low risk myoview ([place])   CATARACT SURGERY     CHOLECYSTECTOMY     COLONOSCOPY  02/2010   extensive diverticulosis throughout, small internal hemorrhoids, rec rpt 5 yrs ([name]. Allyn [facility])   [place]   dexa scan  09/2011   femur -0.8, forearm 0.6 => normal   ENDOVENOUS ABLATION SAPHENOUS VEIN W/ LASER Left 03/2014   Kellie Simmering   ESI  2014   L S1 transforaminal ESI x2 ([place]) without improvement   HAND SURGERY     INTRAMEDULLARY (IM) NAIL INTERTROCHANTERIC Right 09/07/2020   Procedure: INTRAMEDULLARY (IM) NAIL INTERTROCHANTRIC;  Surgeon: Leandrew Koyanagi, MD;  Location: [facility];  Service: Orthopedics;  Laterality: Right;    lexiscan myoview  03/2011   Small basal inferolateral and anterolateral reversible perfusion defect suggests ischemia.  EF was normal.   old infarct, no new ischemia   LOWER EXTREMITY ANGIOGRAPHY Right 09/06/2018   Procedure: LOWER EXTREMITY ANGIOGRAPHY;  Surgeon: Algernon Huxley, MD;  Location: [facility];  Service: Cardiovascular;  Laterality: Right;   toe surgery     TYMPANOPLASTY  1960s   R side   Vault prolapse A & P repair  2009   [name]    Social History: Social History   Tobacco Use   Smoking status: Former    Pack years: 0.00  Types: Cigarettes    Quit date: 03/05/1979    Years since quitting: 41.5   Smokeless tobacco: Never  Vaping Use   Vaping Use: Never used  Substance Use Topics   Alcohol use: No    Alcohol/week: 0.0 standard drinks   Drug use: No   Additional social history:  Please also refer to relevant sections of EMR.  Family History: Family History  Problem Relation Age of Onset   Hypertension Mother    Stroke Mother    Diabetes Father    Diabetes Sister    Diabetes Brother    Heart disease Son    Colon cancer Neg Hx      Allergies and Medications: Allergies  Allergen Reactions   Metformin And Related Diarrhea   No current facility-administered medications on file prior to encounter.   Current Outpatient Medications on File Prior to Encounter  Medication Sig Dispense Refill   B Complex-C (B-COMPLEX WITH VITAMIN C) tablet Take 1 tablet by mouth daily.     B COMPLEX-C-BIOTIN-D-ZINC-FA PO Take 1 tablet by mouth daily.     ALPRAZolam (XANAX) 1 MG tablet Take 0.5 tablets (0.5 mg total) by mouth at bedtime as needed for anxiety. 15 tablet 0   alum & mag hydroxide-simeth (MAALOX/MYLANTA) 200-200-20 MG/5ML suspension Take 30 mLs by mouth every 4 (four) hours as needed for indigestion. 355 mL 0   bisacodyl (DULCOLAX) 10 MG suppository Place 10 mg rectally as needed for moderate constipation. : If not relieved by MOM, give  10 mg Bisacodyl suppositiory rectally X 1 dose in 24 hours as needed     diclofenac sodium (VOLTAREN) 1 % GEL Apply 1 application topically 3 (three) times daily. 1 Tube 1   DILT-XR 120 MG 24 hr capsule TAKE 1 CAPSULE BY MOUTH EVERY DAY 30 capsule 6   [name] 5 MG TABS tablet TAKE 1 TABLET BY MOUTH TWICE A DAY 60 tablet 5   famotidine (PEPCID) 20 MG tablet Take 1 tablet (20 mg total) by mouth daily as needed for heartburn (breakthrough heartburn).     fluticasone (FLONASE) 50 MCG/ACT nasal spray Place 1 spray into both nostrils daily as needed for allergies.     furosemide ([name]) 40 MG tablet Take 1 tablet (40 mg total) by mouth 2 (two) times daily. 60 tablet 0   glucosamine-chondroitin 500-400 MG tablet Take 1 tablet by mouth daily.     HYDROcodone-acetaminophen (NORCO/VICODIN) 5-325 MG tablet Take 1 tablet by mouth every 6 (six) hours as needed for moderate pain or severe pain. 40 tablet 0   Lancets (ONETOUCH DELICA PLUS GEZMOQ94T) MISC Use to test blood sugar once daily dx code E11.9 100 each 1   loratadine (CLARITIN) 10 MG tablet Take 10 mg by mouth daily as needed for allergies.     Magnesium Hydroxide (MILK OF MAGNESIA PO) Take 30 mLs by mouth. If no BM in 3 days, give 30 cc Milk of Magnesium p.o. x 1 dose in 24 hours as needed     methocarbamol (ROBAXIN) 500 MG tablet Take 1 tablet (500 mg total) by mouth every 6 (six) hours as needed for muscle spasms. 30 tablet 0   metoprolol tartrate (LOPRESSOR) 50 MG tablet TAKE 1 TABLET BY MOUTH TWICE A DAY 180 tablet 3   Multiple Vitamins-Minerals (ICAPS) CAPS Take 1 capsule by mouth daily.     nitroGLYCERIN (NITROSTAT) 0.4 MG SL tablet Place 1 tablet (0.4 mg total) under the tongue every 5 (five) minutes as needed for chest pain.  25 tablet 3   nystatin ointment (MYCOSTATIN) APPLY TO AFFECTED AREA TWICE DAILY 30 g 3   ondansetron (ZOFRAN) 4 MG tablet Take 1 tablet (4 mg total) by mouth every 6 (six) hours as needed for nausea. 20 tablet 0   ONETOUCH  VERIO test strip USE TO TEST BLOOD SUGAR ONCE DAILY. DX:E11.9 50 strip 11   pantoprazole (PROTONIX) 40 MG tablet TAKE 1 TABLET BY MOUTH TWICE A DAY 180 tablet 3   pioglitazone (ACTOS) 15 MG tablet TAKE 1 TABLET BY MOUTH EVERY DAY 30 tablet 6   polyethylene glycol (MIRALAX / GLYCOLAX) 17 g packet Take 17 g by mouth daily as needed for mild constipation. 14 each 0   potassium chloride (MICRO-K) 10 MEQ CR capsule TAKE ONE CAPSULE BY MOUTH ONCE DAILY 90 capsule 1   pravastatin (PRAVACHOL) 20 MG tablet Take 1 tablet (20 mg total) by mouth daily. 90 tablet 3   Probiotic Product (ALIGN) 4 MG CAPS Take 1 capsule (4 mg total) by mouth daily.     senna (SENOKOT) 8.6 MG TABS tablet Take 1 tablet (8.6 mg total) by mouth daily. 120 tablet 0   sertraline ([name]) 50 MG tablet Take 1 tablet (50 mg total) by mouth daily. 30 tablet 6   sitaGLIPtin (JANUVIA) 100 MG tablet TAKE 1/2 TABLET BY MOUTH EVERY DAY 30 tablet 5   Sodium Phosphates (RA SALINE ENEMA RE) Place rectally. If not relieved by Biscodyl suppository, give disposable Saline Enema rectally X 1 dose/24 hrs as needed (     vitamin E 1000 UNIT capsule Take 1,000 Units by mouth daily.      Objective: BP 114/82   Pulse (!) 136   Temp 98.1 F (36.7 C) (Oral)   Resp 19   SpO2 100%  Exam:  Physical Exam Constitutional:      General: She is not in acute distress. HENT:     Head: Normocephalic and atraumatic.  Eyes:     Conjunctiva/sclera: Conjunctivae normal.     Comments: Pupils pinpoint and minimally reactive  Cardiovascular:     Rate and Rhythm: Tachycardia present. Rhythm irregular.     Pulses: Normal pulses.  Pulmonary:     Effort: Pulmonary effort is normal.     Breath sounds: Normal breath sounds.  Musculoskeletal:     Right lower leg: No edema.     Left lower leg: No edema.  Neurological:     Mental Status: She is lethargic.     GCS: GCS eye subscore is 3. GCS verbal subscore is 5. GCS motor subscore is 6.     Motor: Weakness  present.     Comments: Awakens to voice and sternal rub, follows commands with prompting, able to move both feet and squeeze fingers with hands     Labs and Imaging: CBC BMET  Recent Labs  Lab 09/21/20 1222  WBC 13.1*  HGB 11.6*  HCT 38.4  PLT 408*   Recent Labs  Lab 09/21/20 1222  NA 139  K 3.6  CL 98  CO2 32  BUN 18  CREATININE 1.00  GLUCOSE 173*  CALCIUM 9.1     EKG: A-Fib with RVR  PORTABLE CHEST 1 VIEW COMPARISON:  09/11/2020 IMPRESSION: Cardiomegaly with pulmonary vascular congestion.  CT HEAD WITHOUT CONTRAST COMPARISON:  08/08/2019 CT and prior studies IMPRESSION: 1. No evidence of acute intracranial abnormality. 2. Atrophy, chronic small-vessel white matter ischemic changes and remote LEFT frontoparietal white matter infarct. 3. New RIGHT sphenoid sinus fluid/acute sinusitis.  Delora Fuel, MD 09/21/2020, 4:23 PM PGY-1, [facility] [employer] Intern pager: [phone], text pages welcome   [name] Upper-Level Resident Addendum   I have independently interviewed and examined the patient. I have discussed the above with the original author and agree with their documentation. My edits for correction/addition/clarification are within the document. Please see also any attending notes.   Gerlene Fee, DO PGY-2, [facility] 09/21/2020 5:51 PM  [name] Service pager: [phone] (text pages welcome through [place])

## 2020-09-21 NOTE — ED Triage Notes (Signed)
Patient BIB [facility] from home. Complaint of altered mental status. Last seen well by staff on Friday. Tachy with EMS. 2L [place] @ baseline.

## 2020-09-21 NOTE — ED Notes (Signed)
Informed NT of monitor reading.

## 2020-09-21 NOTE — Progress Notes (Signed)
Received secure chat message from RN stating that patient had lower blood pressures with heart rate still elevated in 140s. Patient has orders for metoprolol 5 mg IV every 6 hours.  Last dose at 1759 on 09/21/2020.  Most recent blood pressure 96/65 with heart rate of 142.  RN has held evening dose of Lopressor.  Contacted pharmacist for ED and discussed options for rate control which included diltiazem, amiodarone or digoxin. Will discontinue metoprolol 5 mg IV every 6 Ordered one-time dose of diltiazem 10 mg IV Will monitor heart rate following this dosing  Eulis Foster, [place], PGY-2  [facility] Intern Pager [phone]

## 2020-09-22 ENCOUNTER — Encounter (HOSPITAL_COMMUNITY): Payer: Self-pay | Admitting: Family Medicine

## 2020-09-22 ENCOUNTER — Encounter: Payer: Self-pay | Admitting: Family Medicine

## 2020-09-22 ENCOUNTER — Other Ambulatory Visit (HOSPITAL_COMMUNITY): Payer: Self-pay

## 2020-09-22 DIAGNOSIS — R6510 Systemic inflammatory response syndrome (SIRS) of non-infectious origin without acute organ dysfunction: Secondary | ICD-10-CM

## 2020-09-22 DIAGNOSIS — I517 Cardiomegaly: Secondary | ICD-10-CM | POA: Diagnosis present

## 2020-09-22 DIAGNOSIS — F411 Generalized anxiety disorder: Secondary | ICD-10-CM | POA: Diagnosis not present

## 2020-09-22 DIAGNOSIS — I4891 Unspecified atrial fibrillation: Secondary | ICD-10-CM | POA: Diagnosis not present

## 2020-09-22 DIAGNOSIS — Z6830 Body mass index (BMI) 30.0-30.9, adult: Secondary | ICD-10-CM | POA: Diagnosis not present

## 2020-09-22 DIAGNOSIS — Z9981 Dependence on supplemental oxygen: Secondary | ICD-10-CM

## 2020-09-22 DIAGNOSIS — E1122 Type 2 diabetes mellitus with diabetic chronic kidney disease: Secondary | ICD-10-CM | POA: Diagnosis present

## 2020-09-22 DIAGNOSIS — E669 Obesity, unspecified: Secondary | ICD-10-CM | POA: Diagnosis present

## 2020-09-22 DIAGNOSIS — E1140 Type 2 diabetes mellitus with diabetic neuropathy, unspecified: Secondary | ICD-10-CM | POA: Diagnosis not present

## 2020-09-22 DIAGNOSIS — M80059D Age-related osteoporosis with current pathological fracture, unspecified femur, subsequent encounter for fracture with routine healing: Secondary | ICD-10-CM

## 2020-09-22 DIAGNOSIS — H9193 Unspecified hearing loss, bilateral: Secondary | ICD-10-CM | POA: Diagnosis not present

## 2020-09-22 DIAGNOSIS — E1151 Type 2 diabetes mellitus with diabetic peripheral angiopathy without gangrene: Secondary | ICD-10-CM

## 2020-09-22 DIAGNOSIS — Z7901 Long term (current) use of anticoagulants: Secondary | ICD-10-CM | POA: Diagnosis not present

## 2020-09-22 DIAGNOSIS — N1831 Chronic kidney disease, stage 3a: Secondary | ICD-10-CM | POA: Diagnosis present

## 2020-09-22 DIAGNOSIS — R401 Stupor: Secondary | ICD-10-CM | POA: Diagnosis not present

## 2020-09-22 DIAGNOSIS — I4892 Unspecified atrial flutter: Secondary | ICD-10-CM | POA: Diagnosis present

## 2020-09-22 DIAGNOSIS — E1169 Type 2 diabetes mellitus with other specified complication: Secondary | ICD-10-CM | POA: Diagnosis not present

## 2020-09-22 DIAGNOSIS — R443 Hallucinations, unspecified: Secondary | ICD-10-CM | POA: Diagnosis present

## 2020-09-22 DIAGNOSIS — R011 Cardiac murmur, unspecified: Secondary | ICD-10-CM | POA: Diagnosis not present

## 2020-09-22 DIAGNOSIS — M2550 Pain in unspecified joint: Secondary | ICD-10-CM | POA: Diagnosis not present

## 2020-09-22 DIAGNOSIS — Z87440 Personal history of urinary (tract) infections: Secondary | ICD-10-CM | POA: Diagnosis not present

## 2020-09-22 DIAGNOSIS — I130 Hypertensive heart and chronic kidney disease with heart failure and stage 1 through stage 4 chronic kidney disease, or unspecified chronic kidney disease: Secondary | ICD-10-CM | POA: Diagnosis present

## 2020-09-22 DIAGNOSIS — H9190 Unspecified hearing loss, unspecified ear: Secondary | ICD-10-CM

## 2020-09-22 DIAGNOSIS — M519 Unspecified thoracic, thoracolumbar and lumbosacral intervertebral disc disorder: Secondary | ICD-10-CM | POA: Diagnosis not present

## 2020-09-22 DIAGNOSIS — Z85828 Personal history of other malignant neoplasm of skin: Secondary | ICD-10-CM | POA: Diagnosis not present

## 2020-09-22 DIAGNOSIS — I2510 Atherosclerotic heart disease of native coronary artery without angina pectoris: Secondary | ICD-10-CM | POA: Diagnosis present

## 2020-09-22 DIAGNOSIS — I872 Venous insufficiency (chronic) (peripheral): Secondary | ICD-10-CM | POA: Diagnosis not present

## 2020-09-22 DIAGNOSIS — E1159 Type 2 diabetes mellitus with other circulatory complications: Secondary | ICD-10-CM | POA: Diagnosis not present

## 2020-09-22 DIAGNOSIS — E1142 Type 2 diabetes mellitus with diabetic polyneuropathy: Secondary | ICD-10-CM | POA: Diagnosis present

## 2020-09-22 DIAGNOSIS — S72141D Displaced intertrochanteric fracture of right femur, subsequent encounter for closed fracture with routine healing: Secondary | ICD-10-CM | POA: Diagnosis not present

## 2020-09-22 DIAGNOSIS — G928 Other toxic encephalopathy: Secondary | ICD-10-CM | POA: Diagnosis present

## 2020-09-22 DIAGNOSIS — I4821 Permanent atrial fibrillation: Secondary | ICD-10-CM | POA: Diagnosis present

## 2020-09-22 DIAGNOSIS — N390 Urinary tract infection, site not specified: Secondary | ICD-10-CM | POA: Diagnosis present

## 2020-09-22 DIAGNOSIS — M810 Age-related osteoporosis without current pathological fracture: Secondary | ICD-10-CM

## 2020-09-22 DIAGNOSIS — R8281 Pyuria: Secondary | ICD-10-CM | POA: Diagnosis not present

## 2020-09-22 DIAGNOSIS — Z7401 Bed confinement status: Secondary | ICD-10-CM | POA: Diagnosis not present

## 2020-09-22 DIAGNOSIS — N3001 Acute cystitis with hematuria: Secondary | ICD-10-CM | POA: Diagnosis not present

## 2020-09-22 DIAGNOSIS — E7800 Pure hypercholesterolemia, unspecified: Secondary | ICD-10-CM | POA: Diagnosis not present

## 2020-09-22 DIAGNOSIS — E785 Hyperlipidemia, unspecified: Secondary | ICD-10-CM

## 2020-09-22 DIAGNOSIS — D72828 Other elevated white blood cell count: Secondary | ICD-10-CM

## 2020-09-22 DIAGNOSIS — K5790 Diverticulosis of intestine, part unspecified, without perforation or abscess without bleeding: Secondary | ICD-10-CM | POA: Diagnosis not present

## 2020-09-22 DIAGNOSIS — F418 Other specified anxiety disorders: Secondary | ICD-10-CM | POA: Diagnosis not present

## 2020-09-22 DIAGNOSIS — G319 Degenerative disease of nervous system, unspecified: Secondary | ICD-10-CM

## 2020-09-22 DIAGNOSIS — J9611 Chronic respiratory failure with hypoxia: Secondary | ICD-10-CM

## 2020-09-22 DIAGNOSIS — D72829 Elevated white blood cell count, unspecified: Secondary | ICD-10-CM | POA: Diagnosis present

## 2020-09-22 DIAGNOSIS — K219 Gastro-esophageal reflux disease without esophagitis: Secondary | ICD-10-CM | POA: Diagnosis present

## 2020-09-22 DIAGNOSIS — N183 Chronic kidney disease, stage 3 unspecified: Secondary | ICD-10-CM | POA: Diagnosis not present

## 2020-09-22 DIAGNOSIS — I152 Hypertension secondary to endocrine disorders: Secondary | ICD-10-CM

## 2020-09-22 DIAGNOSIS — I700 Atherosclerosis of aorta: Secondary | ICD-10-CM

## 2020-09-22 DIAGNOSIS — I129 Hypertensive chronic kidney disease with stage 1 through stage 4 chronic kidney disease, or unspecified chronic kidney disease: Secondary | ICD-10-CM | POA: Diagnosis not present

## 2020-09-22 DIAGNOSIS — I5032 Chronic diastolic (congestive) heart failure: Secondary | ICD-10-CM | POA: Diagnosis present

## 2020-09-22 DIAGNOSIS — F32A Depression, unspecified: Secondary | ICD-10-CM | POA: Diagnosis present

## 2020-09-22 DIAGNOSIS — I480 Paroxysmal atrial fibrillation: Secondary | ICD-10-CM | POA: Diagnosis not present

## 2020-09-22 DIAGNOSIS — Z66 Do not resuscitate: Secondary | ICD-10-CM | POA: Diagnosis present

## 2020-09-22 DIAGNOSIS — M4800 Spinal stenosis, site unspecified: Secondary | ICD-10-CM | POA: Diagnosis not present

## 2020-09-22 DIAGNOSIS — R4182 Altered mental status, unspecified: Secondary | ICD-10-CM | POA: Diagnosis not present

## 2020-09-22 DIAGNOSIS — J0130 Acute sphenoidal sinusitis, unspecified: Secondary | ICD-10-CM | POA: Diagnosis present

## 2020-09-22 DIAGNOSIS — I7025 Atherosclerosis of native arteries of other extremities with ulceration: Secondary | ICD-10-CM | POA: Diagnosis not present

## 2020-09-22 DIAGNOSIS — I482 Chronic atrial fibrillation, unspecified: Secondary | ICD-10-CM | POA: Diagnosis present

## 2020-09-22 DIAGNOSIS — Z20822 Contact with and (suspected) exposure to covid-19: Secondary | ICD-10-CM | POA: Diagnosis present

## 2020-09-22 DIAGNOSIS — I5030 Unspecified diastolic (congestive) heart failure: Secondary | ICD-10-CM | POA: Diagnosis not present

## 2020-09-22 DIAGNOSIS — M545 Low back pain, unspecified: Secondary | ICD-10-CM | POA: Diagnosis not present

## 2020-09-22 DIAGNOSIS — Z87891 Personal history of nicotine dependence: Secondary | ICD-10-CM | POA: Diagnosis not present

## 2020-09-22 HISTORY — DX: Age-related osteoporosis without current pathological fracture: M81.0

## 2020-09-22 HISTORY — DX: Personal history of urinary (tract) infections: Z87.440

## 2020-09-22 HISTORY — DX: Dependence on supplemental oxygen: Z99.81

## 2020-09-22 HISTORY — DX: Chronic respiratory failure with hypoxia: J96.11

## 2020-09-22 HISTORY — DX: Atherosclerosis of aorta: I70.0

## 2020-09-22 HISTORY — DX: Degenerative disease of nervous system, unspecified: G31.9

## 2020-09-22 HISTORY — DX: Unspecified hearing loss, unspecified ear: H91.90

## 2020-09-22 LAB — CBG MONITORING, ED
Glucose-Capillary: 126 mg/dL — ABNORMAL HIGH (ref 70–99)
Glucose-Capillary: 128 mg/dL — ABNORMAL HIGH (ref 70–99)
Glucose-Capillary: 124 mg/dL — ABNORMAL HIGH (ref 70–99)
Glucose-Capillary: 155 mg/dL — ABNORMAL HIGH (ref 70–99)

## 2020-09-22 LAB — BASIC METABOLIC PANEL
Anion gap: 9 (ref 5–15)
BUN: 17 mg/dL (ref 8–23)
CO2: 27 mmol/L (ref 22–32)
Calcium: 8.8 mg/dL — ABNORMAL LOW (ref 8.9–10.3)
Chloride: 103 mmol/L (ref 98–111)
Creatinine, Ser: 0.73 mg/dL (ref 0.44–1.00)
GFR, Estimated: >60 mL/min (ref >60)
Glucose, Bld: 160 mg/dL — ABNORMAL HIGH (ref 70–99)
Potassium: 3.5 mmol/L (ref 3.5–5.1)
Sodium: 139 mmol/L (ref 135–145)

## 2020-09-22 LAB — RAPID URINE DRUG SCREEN, HOSP PERFORMED
Amphetamines: NOT DETECTED
Barbiturates: NOT DETECTED
Benzodiazepines: POSITIVE — AB
Cocaine: NOT DETECTED
Opiates: POSITIVE — AB
Tetrahydrocannabinol: NOT DETECTED

## 2020-09-22 LAB — CBC
HCT: 37.8 % (ref 36.0–46.0)
Hemoglobin: 11.5 g/dL — ABNORMAL LOW (ref 12.0–15.0)
MCH: 29.1 pg (ref 26.0–34.0)
MCHC: 30.4 g/dL (ref 30.0–36.0)
MCV: 95.7 fL (ref 80.0–100.0)
Platelets: 362 10*3/uL (ref 150–400)
RBC: 3.95 MIL/uL (ref 3.87–5.11)
RDW: 15.9 % — ABNORMAL HIGH (ref 11.5–15.5)
WBC: 13.7 10*3/uL — ABNORMAL HIGH (ref 4.0–10.5)
nRBC: 0 % (ref 0.0–0.2)

## 2020-09-22 LAB — RESP PANEL BY RT-PCR (FLU A&B, COVID) ARPGX2
Influenza A by PCR: NEGATIVE
Influenza B by PCR: NEGATIVE
SARS Coronavirus 2 by RT PCR: NEGATIVE

## 2020-09-22 LAB — GLUCOSE, CAPILLARY: Glucose-Capillary: 140 mg/dL — ABNORMAL HIGH (ref 70–99)

## 2020-09-22 MED ORDER — ACETAMINOPHEN 325 MG PO TABS
650.0000 mg | ORAL_TABLET | Freq: Four times a day (QID) | ORAL | Status: DC
  Administered 2020-09-22 – 2020-09-25 (×13): 650 mg via ORAL
  Filled 2020-09-22 (×14): qty 2

## 2020-09-22 MED ORDER — METOPROLOL TARTRATE 5 MG/5ML IV SOLN
5.0000 mg | Freq: Once | INTRAVENOUS | Status: AC
  Administered 2020-09-22: 5 mg via INTRAVENOUS

## 2020-09-22 MED ORDER — CALCIUM CARBONATE ANTACID 500 MG PO CHEW: 1.0000 | CHEWABLE_TABLET | Freq: Two times a day (BID) | ORAL | Status: DC | PRN

## 2020-09-22 MED ORDER — DILTIAZEM HCL ER COATED BEADS 120 MG PO CP24
120.0000 mg | ORAL_CAPSULE | Freq: Every day | ORAL | Status: DC
  Administered 2020-09-23 – 2020-09-25 (×3): 120 mg via ORAL
  Filled 2020-09-22 (×3): qty 1

## 2020-09-22 MED ORDER — DILTIAZEM HCL ER COATED BEADS 120 MG PO CP24
120.0000 mg | ORAL_CAPSULE | Freq: Every morning | ORAL | Status: DC
  Filled 2020-09-22: qty 1

## 2020-09-22 MED ORDER — APIXABAN 5 MG PO TABS
5.0000 mg | ORAL_TABLET | Freq: Two times a day (BID) | ORAL | Status: DC
  Administered 2020-09-22 – 2020-09-25 (×8): 5 mg via ORAL
  Filled 2020-09-22 (×8): qty 1

## 2020-09-22 MED ORDER — ALPRAZOLAM 0.25 MG PO TABS
0.2500 mg | ORAL_TABLET | Freq: Every evening | ORAL | Status: DC | PRN
  Administered 2020-09-22 – 2020-09-23 (×2): 0.25 mg via ORAL
  Filled 2020-09-22 (×2): qty 1

## 2020-09-22 MED ORDER — PRAVASTATIN SODIUM 10 MG PO TABS
20.0000 mg | ORAL_TABLET | Freq: Every day | ORAL | Status: DC
  Administered 2020-09-22 – 2020-09-25 (×5): 20 mg via ORAL
  Filled 2020-09-22 (×5): qty 2

## 2020-09-22 MED ORDER — PANTOPRAZOLE SODIUM 40 MG PO TBEC
40.0000 mg | DELAYED_RELEASE_TABLET | Freq: Two times a day (BID) | ORAL | Status: DC
  Administered 2020-09-22 – 2020-09-25 (×8): 40 mg via ORAL
  Filled 2020-09-22 (×9): qty 1

## 2020-09-22 MED ORDER — SERTRALINE HCL 100 MG PO TABS
100.0000 mg | ORAL_TABLET | Freq: Every day | ORAL | Status: DC
  Administered 2020-09-23 – 2020-09-25 (×3): 100 mg via ORAL
  Filled 2020-09-22 (×3): qty 1

## 2020-09-22 MED ORDER — FUROSEMIDE 20 MG PO TABS: 40.0000 mg | ORAL_TABLET | Freq: Two times a day (BID) | ORAL | Status: DC

## 2020-09-22 MED ORDER — LACTATED RINGERS IV SOLN: INTRAVENOUS | Status: DC

## 2020-09-22 MED ORDER — DILTIAZEM HCL ER COATED BEADS 120 MG PO CP24
120.0000 mg | ORAL_CAPSULE | ORAL | Status: AC
  Administered 2020-09-22: 120 mg via ORAL
  Filled 2020-09-22: qty 1

## 2020-09-22 MED ORDER — SERTRALINE HCL 50 MG PO TABS
50.0000 mg | ORAL_TABLET | Freq: Every day | ORAL | Status: DC
  Administered 2020-09-22: 50 mg via ORAL
  Filled 2020-09-22 (×2): qty 1

## 2020-09-22 MED ORDER — POLYETHYLENE GLYCOL 3350 17 G PO PACK
17.0000 g | PACK | Freq: Every day | ORAL | Status: DC
  Administered 2020-09-22 – 2020-09-25 (×2): 17 g via ORAL
  Filled 2020-09-22 (×2): qty 1

## 2020-09-22 NOTE — ED Notes (Addendum)
RN called micro requesting update on COVID swab collected 1913. Per lab tech, no sample available. Recollected COVID test and sent at this time.

## 2020-09-22 NOTE — Progress Notes (Signed)
FPTS Interim Progress Note  S: Patient evaluated at bedside for nightly rounds. Resting comfortably. Expresses concern that she was so confused and hallucinating. Denies any recent confusion or hallucinations over the past 24h. Feels her heart racing somewhat, but denies chest pain. She is requesting her nightly Xanax. No other complaints at this time.  O: BP 98/81 (BP Location: Left Arm)   Pulse (!) 140   Temp 98.5 F (36.9 C) (Oral)   Resp (!) 25   Ht 4\' 8"  (1.422 m)   Wt 62.1 kg   SpO2 95%   BMI 30.71 kg/m   Gen: alert, resting comfortably, NAD CV: irregularly irregular rhythm, tachycardic to 130s Resp: normal effort, lungs CTAB Abd: soft, nontender Neuro: alert, oriented x3  A/P: A-Fib with RVR HR persistently between 100-140. Most recent BP 113/77 -Continue home Diltiazem 120mg  daily -Will continue to monitor closely. Plan for Metoprolol as needed for HR and as BP allows.  Anxiety Patient requesting her home Xanax. She takes 0.5mg  Xanax nightly at home. -Will add Xanax at reduced dose (0.25mg  qhs prn). Ideally patient will taper off this medication entirely.   Alcus Dad, MD 09/22/2020, 9:39 PM PGY-1, [facility] Service pager [phone]

## 2020-09-22 NOTE — Progress Notes (Signed)
Patient brought to 4E from ED. Telemetry box applied, CCMD notified. Patient oriented to room and staff. Call bell in reach. Daughter in law present.  Daymon Larsen, RN

## 2020-09-22 NOTE — ED Notes (Signed)
Breakfast Orders placed 

## 2020-09-22 NOTE — ED Notes (Signed)
RN attempted report x1.  

## 2020-09-22 NOTE — Progress Notes (Signed)
Family Medicine Teaching Service Daily Progress Note Intern Pager: [phone]  Patient name: [name] Medical record number: [id number] Date of birth: Jun 11, 1927 Age: 87 y.o. Gender: female  Primary Care Provider: Ria Bush, MD Consultants: None Code Status: DNR  Pt Overview and Major Events to Date:  6/20-Admitted  Assessment and Plan: [name] is a 87 y.o. female presenting with Altered mental status. PMH is significant for T2DM, HLD, HFpEF (EF 50-55%), depression/anxiety, GERD, CAD, CKD III, h/o skin cancer, chronic pain 2/2 spinal stenosis,  right femoral neck fracture s/p intramedullary nail.    AMS Greatly improved.  UDS positive for Opioids & Benzodiazapine's, as expected.  Will follow-up Ucx and Bcx and narrow anti-biotics appropriately.  Will slowly reintroduce and decrease home meds.     - Start Heart Healthy diet - Stop Flagyl, Continue Vanc and Cefepime - Restarted on home medications  Right femoral neck fracture s/p ORIF on 09/07/20 Will hold opioids in setting of AMS. - Schedulued Tylenol 650 mg q6hr    Atrial Fibrillation on chronic anticoagulation Still tachycardic in RVR.  Restarting oral Diltiazem.  Hold home Metoprolol and Lasix.  Plan to re-add Metoprolol once BP stabilizes. - Start home Diltiazem  HTN BP's still soft normal.  Diltiazem 125 mg restarted.  Hold home Metoprolol and Lasix.   - Continue to monitor   Hypokalemia 3.5 today.  Currently holding Lasix.  On LR.  On 10 mEq supplement. At home. - AM BMP  Depression/Anxiety On Zoloft 50mg  QD and Xanax 0.5mg  qhs PRN.  Has been on Zoloft 50 mg for over 1 month. - Will increase Zoloft to 100 mg  Constipation - Start Daily Miralax  GoC  Do not want to return to previous SNF.  Reach out to SW for SNF placement.   - Consult SW for SNF placement  FEN/GI: Heart healthy diet PPx: Eliquis   Status is: Observation  The patient will require care spanning > 2 midnights and should be  moved to inpatient because: Unsafe d/c plan  Dispo: The patient is from: Home              Anticipated d/c is to: SNF              Patient currently is not medically stable to d/c.   Difficult to place patient No    Subjective:  Patient indicates feeling well this morning outside of hip/leg pain.  Objective: Temp:  [98.1 F (36.7 C)] 98.1 F (36.7 C) (06/20 1040) Pulse Rate:  [33-143] 140 (06/21 0600) Resp:  [17-25] 21 (06/21 0600) BP: (88-127)/(54-105) 110/80 (06/21 0600) SpO2:  [90 %-100 %] 99 % (06/21 0600) Weight:  [62.1 kg] 62.1 kg (06/21 0129) Physical Exam:  Physical Exam Constitutional:      General: She is not in acute distress. HENT:     Head: Normocephalic and atraumatic.     Mouth/Throat:     Mouth: Mucous membranes are moist.  Eyes:     Extraocular Movements: Extraocular movements intact.  Cardiovascular:     Rate and Rhythm: Tachycardia present. Rhythm irregular.     Pulses: Normal pulses.  Pulmonary:     Effort: Pulmonary effort is normal.     Breath sounds: Normal breath sounds.  Abdominal:     General: Abdomen is flat.     Palpations: Abdomen is soft.     Tenderness: There is no abdominal tenderness.  Musculoskeletal:     Right lower leg: No edema.  Left lower leg: No edema.     Comments: Right leg ROM limited due to pain  Skin:    General: Skin is warm.  Neurological:     General: No focal deficit present.     Mental Status: She is alert and oriented to person, place, and time.     Laboratory: Recent Labs  Lab 09/21/20 1222 09/22/20 0143  WBC 13.1* 13.7*  HGB 11.6* 11.5*  HCT 38.4 37.8  PLT 408* 362   Recent Labs  Lab 09/21/20 1222 09/22/20 0143  NA 139 139  K 3.6 3.5  CL 98 103  CO2 32 27  BUN 18 17  CREATININE 1.00 0.73  CALCIUM 9.1 8.8*  PROT 6.9  --   BILITOT 1.9*  --   ALKPHOS 144*  --   ALT 41  --   AST 37  --   GLUCOSE 173* 160*     Imaging/Diagnostic Tests: No new imaging  Delora Fuel, MD 09/22/2020,  7:26 AM PGY-1, [place] Intern pager: [phone], text pages welcome

## 2020-09-22 NOTE — ED Notes (Signed)
Checked patient cbg it was 20 notified RN Lovena Le patient is resting with call bell in reach and family at bedside

## 2020-09-22 NOTE — ED Notes (Signed)
Discussed plan of care with Dr. Rosita Fire. HR increased 140s A fib. Order or Lopressor placed.

## 2020-09-23 DIAGNOSIS — Z87440 Personal history of urinary (tract) infections: Secondary | ICD-10-CM

## 2020-09-23 DIAGNOSIS — N3001 Acute cystitis with hematuria: Secondary | ICD-10-CM

## 2020-09-23 LAB — CBC
HCT: 32.7 % — ABNORMAL LOW (ref 36.0–46.0)
Hemoglobin: 10.3 g/dL — ABNORMAL LOW (ref 12.0–15.0)
MCH: 29.4 pg (ref 26.0–34.0)
MCHC: 31.5 g/dL (ref 30.0–36.0)
MCV: 93.4 fL (ref 80.0–100.0)
Platelets: 331 10*3/uL (ref 150–400)
RBC: 3.5 MIL/uL — ABNORMAL LOW (ref 3.87–5.11)
RDW: 16 % — ABNORMAL HIGH (ref 11.5–15.5)
WBC: 10.1 10*3/uL (ref 4.0–10.5)
nRBC: 0 % (ref 0.0–0.2)

## 2020-09-23 LAB — BASIC METABOLIC PANEL
Anion gap: 6 (ref 5–15)
BUN: 15 mg/dL (ref 8–23)
CO2: 27 mmol/L (ref 22–32)
Calcium: 8.5 mg/dL — ABNORMAL LOW (ref 8.9–10.3)
Chloride: 104 mmol/L (ref 98–111)
Creatinine, Ser: 0.77 mg/dL (ref 0.44–1.00)
GFR, Estimated: >60 mL/min (ref >60)
Glucose, Bld: 199 mg/dL — ABNORMAL HIGH (ref 70–99)
Potassium: 3.7 mmol/L (ref 3.5–5.1)
Sodium: 137 mmol/L (ref 135–145)

## 2020-09-23 LAB — URINE CULTURE: Culture: >=100000 — AB

## 2020-09-23 LAB — GLUCOSE, CAPILLARY
Glucose-Capillary: 146 mg/dL — ABNORMAL HIGH (ref 70–99)
Glucose-Capillary: 128 mg/dL — ABNORMAL HIGH (ref 70–99)
Glucose-Capillary: 242 mg/dL — ABNORMAL HIGH (ref 70–99)
Glucose-Capillary: 111 mg/dL — ABNORMAL HIGH (ref 70–99)
Glucose-Capillary: 173 mg/dL — ABNORMAL HIGH (ref 70–99)

## 2020-09-23 MED ORDER — METOPROLOL TARTRATE 50 MG PO TABS
50.0000 mg | ORAL_TABLET | Freq: Two times a day (BID) | ORAL | Status: DC
  Administered 2020-09-23 – 2020-09-25 (×6): 50 mg via ORAL
  Filled 2020-09-23 (×6): qty 1

## 2020-09-23 MED ORDER — METOPROLOL TARTRATE 5 MG/5ML IV SOLN
5.0000 mg | Freq: Once | INTRAVENOUS | Status: AC
  Administered 2020-09-23: 5 mg via INTRAVENOUS
  Filled 2020-09-23: qty 5

## 2020-09-23 MED ORDER — OXYCODONE HCL 5 MG PO TABS: 2.5000 mg | ORAL_TABLET | Freq: Four times a day (QID) | ORAL | Status: DC

## 2020-09-23 MED ORDER — OXYCODONE HCL 5 MG PO TABS
2.5000 mg | ORAL_TABLET | Freq: Three times a day (TID) | ORAL | Status: DC | PRN
  Administered 2020-09-23 – 2020-09-25 (×5): 2.5 mg via ORAL
  Filled 2020-09-23 (×5): qty 1

## 2020-09-23 MED ORDER — NYSTATIN 100000 UNIT/GM EX POWD
Freq: Two times a day (BID) | CUTANEOUS | Status: DC
  Filled 2020-09-23: qty 15

## 2020-09-23 NOTE — NC FL2 (Signed)
[place] MEDICAID FL2 LEVEL OF CARE SCREENING TOOL     IDENTIFICATION  Patient Name: [name] Birthdate: July 11, 1927 Sex: female Admission Date (Current Location): 09/21/2020  [facility] and [place] Number:  Herbalist and Address:  [facility] [place], [place] [address], [place], [place] [address]      Provider Number: [id number]  Attending Physician Name and Address:  McDiarmid, Blane Ohara, MD  Relative Name and Phone Number:       Current Level of Care: Hospital Recommended Level of Care: [place] Prior Approval Number:    Date Approved/Denied:   PASRR Number: [id number]  Discharge Plan: SNF    Current Diagnoses: Patient Active Problem List   Diagnosis Date Noted   Atrial fibrillation with RVR ([place]) 09/22/2020   Cerebral atrophy ([place]) 09/22/2020   Atherosclerosis of aorta ([place]) 09/22/2020   Osteoporosis 09/22/2020   Bilirubinemia 09/22/2020   Leukocytosis 09/22/2020   Pyuria 09/22/2020   History of recurrent UTIs 09/22/2020   Hearing loss 09/22/2020   Chronic respiratory failure with hypoxia, on home O2 therapy ([place]) 09/22/2020   SIRS (systemic inflammatory response syndrome) ([place])    Altered mental status, unspecified 09/21/2020   Postoperative anemia due to acute blood loss 09/13/2020   Displaced intertrochanteric fracture of right femur, initial encounter for closed fracture ([name]) 09/05/2020   Hip fracture due to osteoporosis with routine healing 09/05/2020   Financial difficulty 02/26/2020   UTI (urinary tract infection) 10/01/2019   CKD stage 3 due to type 2 diabetes mellitus ([place]) 09/02/2019   History of CVA in adulthood 08/23/2019   Polypharmacy 07/27/2019   E coli bacteremia 02/13/2019   Dyspnea    Chronic leg pain 11/14/2018   Atherosclerosis of native arteries of the extremities with ulceration ([place]) 08/21/2018   Chronic constipation 06/18/2018   Advanced directives, counseling/discussion 01/29/2018    Bilateral hearing loss 01/29/2018   Spinal stenosis of lumbosacral region 03/00/9233   Systolic murmur 00/76/2263   De Quervain's tenosynovitis, left 01/09/2017   Encounter for chronic pain management 02/09/2016   Paroxysmal atrial fibrillation (HCC)    Varicose veins of left lower extremity with inflammation, with ulcer of calf limited to breakdown of skin ([place]) 05/22/2015   Varicose veins of right lower extremity with inflammation 01/23/2015   Diabetes mellitus type 2 with peripheral artery disease (HCC)    Chronic venous insufficiency    Bleeding from varicose veins of lower extremity, left 02/13/2014   Diabetic polyneuropathy ([place]) 11/19/2013   Right shoulder pain 08/19/2013   Abdominal pain 05/14/2012   Unsteady gait 12/13/2011   Chronic lower back pain 09/14/2011   Hyperlipidemia associated with type 2 diabetes mellitus ([place]) 09/06/2010   GAD (generalized anxiety disorder) 03/15/2010   Obesity, Class I, BMI 30-34.9 03/15/2010   Hypertension associated with type 2 diabetes mellitus ([place]) 03/15/2010   (HFpEF) heart failure with preserved ejection fraction ([place]) 03/15/2010   Coronary artery disease     Orientation RESPIRATION BLADDER Height & Weight     Self, Time, Situation, Place  O2 Incontinent Weight: 137 lb (62.1 kg) Height:  4\' 8"  (142.2 cm)  BEHAVIORAL SYMPTOMS/MOOD NEUROLOGICAL BOWEL NUTRITION STATUS      Continent Diet (please see discharge summary)  AMBULATORY STATUS COMMUNICATION OF NEEDS Skin   Supervision Verbally Surgical wounds (closed incision RT Hip)                       Personal Care Assistance Level of Assistance  Bathing, Feeding, Dressing Bathing Assistance: Limited assistance Feeding assistance: Independent Dressing Assistance: Limited assistance     Functional Limitations Info  Sight, Hearing, Speech Sight Info: Adequate Hearing Info: Impaired Speech Info: Adequate    SPECIAL CARE FACTORS FREQUENCY  PT (By licensed PT), OT (By licensed  OT)     PT Frequency: 5x per week OT Frequency: 5x per week            Contractures Contractures Info: Not present    Additional Factors Info  Code Status, Allergies, Insulin Sliding Scale Code Status Info: DNR Allergies Info: Metformin And Related   Insulin Sliding Scale Info: see discharge summary       Current Medications (09/23/2020):  This is the current hospital active medication list Current Facility-Administered Medications  Medication Dose Route Frequency Provider Last Rate Last Admin   acetaminophen (TYLENOL) tablet 650 mg  650 mg Oral Q6H Lilland, Alana, DO   650 mg at 09/23/20 1610   ALPRAZolam Duanne Moron) tablet 0.25 mg  0.25 mg Oral QHS PRN Alcus Dad, MD   0.25 mg at 09/22/20 2206   apixaban (ELIQUIS) tablet 5 mg  5 mg Oral BID Simmons-Robinson, Makiera, MD   5 mg at 09/23/20 0909   calcium carbonate (TUMS - dosed in mg elemental calcium) chewable tablet 200 mg of elemental calcium  1 tablet Oral BID PRN Lilland, Alana, DO       ceFEPIme (MAXIPIME) 2 g in sodium chloride 0.9 % 100 mL IVPB  2 g Intravenous Q24H Lilland, Alana, DO 200 mL/hr at 09/23/20 1405 2 g at 09/23/20 1405   diltiazem (CARDIZEM CD) 24 hr capsule 120 mg  120 mg Oral Daily McDiarmid, Blane Ohara, MD   120 mg at 09/23/20 0558   insulin aspart (novoLOG) injection 0-9 Units  0-9 Units Subcutaneous TID WC Maness, Arnette Norris, MD   1 Units at 09/23/20 1110   lactated ringers infusion   Intravenous Continuous Maness, Philip, MD 100 mL/hr at 09/23/20 0557 New Bag at 09/23/20 0557   metoprolol tartrate (LOPRESSOR) tablet 50 mg  50 mg Oral BID Brimage, Vondra, DO   50 mg at 09/23/20 9604   oxyCODONE (Oxy IR/ROXICODONE) immediate release tablet 2.5 mg  2.5 mg Oral Q8H PRN McDiarmid, Blane Ohara, MD   2.5 mg at 09/23/20 1256   pantoprazole (PROTONIX) EC tablet 40 mg  40 mg Oral BID AC Simmons-Robinson, Makiera, MD   40 mg at 09/23/20 0557   polyethylene glycol (MIRALAX / GLYCOLAX) packet 17 g  17 g Oral Daily Lilland, Alana,  DO   17 g at 09/22/20 1300   pravastatin (PRAVACHOL) tablet 20 mg  20 mg Oral QHS Simmons-Robinson, Makiera, MD   20 mg at 09/22/20 2124   sertraline (ZOLOFT) tablet 100 mg  100 mg Oral Daily Lilland, Alana, DO   100 mg at 09/23/20 0909     Discharge Medications: Please see discharge summary for a list of discharge medications.  Relevant Imaging Results:  Relevant Lab Results:   Additional Information SSN [id number] JANSSEN COVID-19 VACCINE 06/11/2019 ,PFIZER Comrnaty([place])09/05/2020 , 09/03/2020  Vinie Sill, LCSW

## 2020-09-23 NOTE — Evaluation (Signed)
Physical Therapy Evaluation Patient Details Name: [name] MRN: [id number] DOB: 12/31/1927 Today's Date: 09/23/2020   History of Present Illness  [name] is a 87 y.o. female presenting with altered mental status concerning for sepsis. Pt admitted for further workup. PMH is significant for T2DM, HLD, HFpEF (EF 50-55%), depression/anxiety, GERD, CAD, CKD III, h/o skin cancer, chronic pain 2/2 spinal stenosis,  right femoral neck fracture s/p intramedullary nail 09/07/20  Clinical Impression  Pt admitted with/for AMS.  Pt still significantly off from her baseline and is very fearful of falling during any mobility, thus needing maximal assist overall..  Pt currently limited functionally due to the problems listed. ( See problems list.)   Pt will benefit from PT to maximize function and safety in order to get ready for next venue listed below.     Follow Up Recommendations SNF;Supervision/Assistance - 24 hour    Equipment Recommendations  Rolling walker with 5" wheels    Recommendations for Other Services       Precautions / Restrictions Precautions Precautions: Fall Precaution Comments: anxious, watch HR, R hip fx/sx 09/07/20 Restrictions RLE Weight Bearing: Weight bearing as tolerated      Mobility  Bed Mobility Overal bed mobility: Needs Assistance Bed Mobility: Supine to Sit     Supine to sit: Max assist;HOB elevated Sit to supine: Max assist   General bed mobility comments: cued for direction and sequencing.  pt was assisted to bridge with max assist to EOB, then truncal assist forward and up to sitting EOB    Transfers Overall transfer level: Needs assistance   Transfers: Sit to/from Stand;Stand Pivot Transfers Sit to Stand: Max assist Stand pivot transfers: Max assist       General transfer comment: face to face assist up/forward with stability during pivot.  Ambulation/Gait             General Gait Details: unable due to patients  anxiety  Stairs            Wheelchair Mobility    Modified Rankin (Stroke Patients Only)       Balance Overall balance assessment: Needs assistance Sitting-balance support: Feet supported;Bilateral upper extremity supported Sitting balance-Leahy Scale: Fair Sitting balance - Comments: prefers UE support, but able to sit EOB once less anxious.  .     Standing balance-Leahy Scale: Zero                               Pertinent Vitals/Pain Pain Assessment: Faces Faces Pain Scale: Hurts even more Pain Location: R LE Pain Descriptors / Indicators: Grimacing;Guarding Pain Intervention(s): Monitored during session    Home Living Family/patient expects to be discharged to:: Private residence Living Arrangements: Spouse/significant other;Non-relatives/Friends (lives with boyfriend of 76 years) Available Help at Discharge: Family;Friend(s);Available PRN/intermittently Type of Home: House Home Access: Stairs to enter Entrance Stairs-Rails: Psychiatric nurse of Steps: 7 Home Layout: One level Home Equipment: Bedside commode;Walker - 4 wheels;Grab bars - toilet;Other (comment);Shower seat - built in [employer] in hallway from living room to bathroom) Additional Comments: [name] and [place] live with pt; neither of which can physically assist pt    Prior Function Level of Independence: Independent with assistive device(s)         Comments: Independent with ADLs, mobility and IADLs using Rollator. Does sponge baths due to fear of falling in shower. Hx of 9 falls (within a few years? pt unsure of timeframe). Recent fall  with R hip fx and increasing difficulty getting around - discharged to [facility] for rehab and then returned home. Helps boyfriend with IADLs. retired Chief Operating Officer, [employer] of [employer]   Dominant Hand: Right    Extremity/Trunk Assessment   Upper Extremity Assessment Upper Extremity Assessment: Defer to OT  evaluation RUE Deficits / Details: pt reports she fell on her RUE a few years ago and lost function; limited to about 90* flexion & abduction of shoulder RUE Coordination: decreased fine motor    Lower Extremity Assessment Lower Extremity Assessment: RLE deficits/detail RLE Deficits / Details: generally weak, needing assist for hip/knee flexion, grossly 3- hip flexion, 3/5 otherwise.  pt scared to w/bear in stance RLE: Unable to fully assess due to pain RLE Coordination: decreased fine motor;decreased gross motor    Cervical / Trunk Assessment Cervical / Trunk Assessment: Kyphotic  Communication   Communication: HOH;Other (comment) (L hearing aide)  Cognition Arousal/Alertness: Awake/alert Behavior During Therapy: Anxious Overall Cognitive Status: Impaired/Different from baseline Area of Impairment: Safety/judgement;Following commands;Memory;Problem solving                     Memory: Decreased short-term memory Following Commands: Follows one step commands with increased time;Follows multi-step commands inconsistently Safety/Judgement: Decreased awareness of safety;Decreased awareness of deficits   Problem Solving: Slow processing;Difficulty sequencing;Requires verbal cues General Comments: Pt with anxiety and fearful of falling. Some decreased memory but functional for ADLs. Reports inability to stand or walk but may not be open to rehab to maximize safety at home      General Comments General comments (skin integrity, edema, etc.): vss on RA    Exercises Other Exercises Other Exercises: warm up hip/knee flex/ext with graded assist.   Assessment/Plan    PT Assessment Patient needs continued PT services  PT Problem List Decreased strength;Decreased activity tolerance;Decreased balance;Decreased mobility;Decreased knowledge of use of DME;Decreased knowledge of precautions;Pain       PT Treatment Interventions DME instruction;Gait training;Functional mobility  training;Therapeutic activities;Therapeutic exercise;Patient/family education    PT Goals (Current goals can be found in the Care Plan section)  Acute Rehab PT Goals Patient Stated Goal: go home PT Goal Formulation: With patient Time For Goal Achievement: 10/07/20 Potential to Achieve Goals: Fair    Frequency Min 3X/week   Barriers to discharge        Co-evaluation               AM-PAC PT "6 Clicks" Mobility  Outcome Measure Help needed turning from your back to your side while in a flat bed without using bedrails?: A Lot Help needed moving from lying on your back to sitting on the side of a flat bed without using bedrails?: A Lot Help needed moving to and from a bed to a chair (including a wheelchair)?: A Lot Help needed standing up from a chair using your arms (e.g., wheelchair or bedside chair)?: A Lot Help needed to walk in hospital room?: Total Help needed climbing 3-5 steps with a railing? : Total 6 Click Score: 10    End of Session   Activity Tolerance: Patient limited by pain;Other (comment) (anxiety) Patient left: in chair;with call bell/phone within reach Nurse Communication: Mobility status PT Visit Diagnosis: Unsteadiness on feet (R26.81);Other abnormalities of gait and mobility (R26.89);Pain;Difficulty in walking, not elsewhere classified (R26.2) Pain - part of body: Hip;Leg    Time: 1220-1252 PT Time Calculation (min) (ACUTE ONLY): 32 min   Charges:   PT Evaluation $  PT Eval Moderate Complexity: 1 Mod PT Treatments $Therapeutic Activity: 8-22 mins        09/23/2020  Ginger Carne., PT Acute Rehabilitation Services [phone]  (pager) [phone]  (office)  Tessie Fass Prezley Qadir 09/23/2020, 1:10 PM

## 2020-09-23 NOTE — Progress Notes (Signed)
Mobility Specialist: Progress Note   09/23/20 1446  Mobility  Activity  (Stood at chair)  Level of Assistance Moderate assist, patient does 50-74%  Assistive Device Front wheel walker  Mobility Out of bed to chair with meals  Mobility Response Tolerated poorly  Mobility performed by Mobility specialist  Bed Position Chair  $Mobility charge 1 Mobility   Pre-Mobility: 96 HR, 95% SpO2 Post-Mobility: 88 HR, 99% SpO2  Pt attempted STS x2. Pt was unable to fully stand on attempt one but was able to stand for roughly 6-7 seconds during second attempt with heavy modA. Pt limited d/t anxiety. Pt in recliner with call bell at her side.   [facility] Muhammad Vacca Mobility Specialist Mobility Specialist Phone: [phone]

## 2020-09-23 NOTE — Progress Notes (Signed)
Family Medicine Teaching Service Daily Progress Note Intern Pager: [phone]  Patient name: [name] Medical record number: [id number] Date of birth: May 16, 1927 Age: 86 y.o. Gender: female  Primary Care Provider: Ria Bush, MD Consultants: None Code Status: DNR  Pt Overview and Major Events to Date:  6/20-Admitted  Assessment and Plan: [name] is a 87 y.o. female presenting with Altered mental status. PMH is significant for T2DM, HLD, HFpEF (EF 50-55%), depression/anxiety, GERD, CAD, CKD III, h/o skin cancer, chronic pain 2/2 spinal stenosis,  right femoral neck fracture s/p intramedullary nail.    Patient medically stable for discharge.   AMS w/ Sepsis rule out Mental status improved to baseline.  Remains afebrile.  WBC- normal.    Day 3 of Ceftriaxone.  Bcx negative after 2 days.    - Stop Vancomycin - Continue Ceftriaxone for day 3 - follow-up Urine cultures  Right femoral neck fracture s/p ORIF on 09/07/20 Patient indicates pain well controlled for the most part.  - Continue scheduled Tylenol 650 mgq6hr - Oxycodone 2.5 mg q6hr PRN - PT/OT eval and treat  Atrial Fibrillation on chronic anticoagulation Went into RVR last night.  Resolved with IV Metoprolol 5 mg x1.   - Continue to monitor HR and BP closely - Restart home Metoprolol 50 mg BID  Hypertension Drop in SBP in 70's overnight.  Restarting Metoprolol and holding home lasix. - Continue mIV LR - Continue home Diltiazem - Restart home Metoprolol  Constipation, resolved Patient reports having 2 normal bowel movements.  On daily Miralax.  Restarting Opioids, can consider increasing if patient developes further constipation.   - Miralax qd  Anxiety/Insomina Patient requesting Xanax prior to sleeping last night.  On 0.5 mg at home. - Gave Xanax 0.25 mg dose last night  GoC Patient medically stable for discharge.  Patient and family not wishing to return to previous SNF - Contacted social work  to begin process of looking for SNF   FEN/GI: Heart Healthy diet PPx: Eliquis   Status is: Inpatient  Remains inpatient appropriate because:Unsafe d/c plan  Dispo: The patient is from: SNF              Anticipated d/c is to: SNF              Patient currently is medically stable to d/c.   Difficult to place patient Yes   Subjective:  Patient indicates feeling well this morning.  No current complaints  Objective: Temp:  [98.5 F (36.9 C)-98.6 F (37 C)] 98.6 F (37 C) (06/22 0300) Pulse Rate:  [69-144] 93 (06/22 0333) Resp:  [18-29] 18 (06/22 0333) BP: (76-133)/(56-121) 115/81 (06/22 0333) SpO2:  [87 %-100 %] 98 % (06/22 0333) Physical Exam:  Physical Exam Constitutional:      General: She is not in acute distress. HENT:     Head: Normocephalic and atraumatic.  Cardiovascular:     Rate and Rhythm: Normal rate. Rhythm irregular.     Pulses: Normal pulses.  Pulmonary:     Effort: Pulmonary effort is normal.     Breath sounds: Normal breath sounds.  Abdominal:     General: Abdomen is flat.     Palpations: Abdomen is soft.     Tenderness: There is no abdominal tenderness.  Skin:    General: Skin is warm.  Neurological:     Mental Status: She is alert.     Laboratory: Recent Labs  Lab 09/21/20 1222 09/22/20 0143  WBC 13.1* 13.7*  HGB  11.6* 11.5*  HCT 38.4 37.8  PLT 408* 362   Recent Labs  Lab 09/21/20 1222 09/22/20 0143  NA 139 139  K 3.6 3.5  CL 98 103  CO2 32 27  BUN 18 17  CREATININE 1.00 0.73  CALCIUM 9.1 8.8*  PROT 6.9  --   BILITOT 1.9*  --   ALKPHOS 144*  --   ALT 41  --   AST 37  --   GLUCOSE 173* 160*    Imaging/Diagnostic Tests: No new imaging  Delora Fuel, MD 09/23/2020, 7:50 AM PGY-1, [place] Intern pager: [phone], text pages welcome

## 2020-09-23 NOTE — Progress Notes (Signed)
Patient has developed rash under left breast. Teaching service paged. Awaiting response.  Daymon Larsen, RN

## 2020-09-23 NOTE — Evaluation (Signed)
Occupational Therapy Evaluation Patient Details Name: [name] MRN: [id number] DOB: 03/13/1928 Today's Date: 09/23/2020    History of Present Illness [name] is a 87 y.o. female presenting with right hip fracture after mechanical fall at home, s/p IM nail on 6/6. PMH is significant for paroxsmal afib on eliquis, h/o provoked DVT, T2DM, HLD, HFpEF (EF 50-55%), depression/anxiety, GERD, CAD, CKD III, h/o skin cancer, chronic pain 2/2 spinal stenosis   Clinical Impression   PTA, pt lives with boyfriend of 30 years and reports typically Modified Independent with ADLs and mobility using Rollator. Pt with recent hip fracture with subsequent SNF rehab staff prior to return home. Pt presents now with deficits in pain, sitting balance, cardiopulmonary endurance and strength. Pt also limited by anxiety. Pt overall Max A for bed mobility with Min to Mod A needed to maintain sitting balance due to sudden onset of severe dizziness. A fib noted with HR up to 139bpm and pt face turning red. Ultimately unable to progress OOB with pt currently requiring Min A for UB ADLs and Total A for LB ADLs bed level at this time. Recommend DC to SNF for short term rehab to maximize confidence with OOB activities and return to independence with ADLs/mobility.     Follow Up Recommendations  SNF;Supervision/Assistance - 24 hour    Equipment Recommendations  Wheelchair (measurements OT);Wheelchair cushion (measurements OT)    Recommendations for Other Services       Precautions / Restrictions Precautions Precautions: Fall Precaution Comments: anxious, watch HR, R hip fx/sx 09/07/20 Restrictions Weight Bearing Restrictions: Yes RLE Weight Bearing: Weight bearing as tolerated      Mobility Bed Mobility Overal bed mobility: Needs Assistance Bed Mobility: Supine to Sit;Sit to Supine     Supine to sit: Max assist;HOB elevated Sit to supine: Max assist   General bed mobility comments: Max A to advance  B LE to EOB, lift trunk - pt attempting to assist with bedrail. benefits from multimodal cues. Only able to sit EOB briefly before dizziness/anxiety too much - able to assist in returning trunk back to supine    Transfers                 General transfer comment: deferred - progressive dizziness and anxiety    Balance Overall balance assessment: Needs assistance Sitting-balance support: Feet supported;Bilateral upper extremity supported Sitting balance-Leahy Scale: Poor Sitting balance - Comments: reliant on UE support + Min A to Mod A from therapist due to dizziness                                   ADL either performed or assessed with clinical judgement   ADL Overall ADL's : Needs assistance/impaired Eating/Feeding: Independent;Bed level   Grooming: Sitting;Minimal assistance   Upper Body Bathing: Minimal assistance;Sitting   Lower Body Bathing: Maximal assistance;Bed level   Upper Body Dressing : Minimal assistance;Sitting   Lower Body Dressing: Total assistance;Bed level       Toileting- Clothing Manipulation and Hygiene: Total assistance;Bed level         General ADL Comments: Pt limited in OOB attempts due to sudden onset of severe dizziness sitting EOB, noted a fib with HR up to 139bpm. Also limited by anxiety     Vision Baseline Vision/History: Wears glasses Wears Glasses: Reading only Patient Visual Report: No change from baseline Vision Assessment?: No apparent visual deficits     Perception  Praxis      Pertinent Vitals/Pain Pain Assessment: Faces Faces Pain Scale: Hurts even more Pain Location: R LE Pain Descriptors / Indicators: Grimacing;Guarding Pain Intervention(s): Monitored during session;Limited activity within patient's tolerance     Hand Dominance Right   Extremity/Trunk Assessment Upper Extremity Assessment Upper Extremity Assessment: Generalized weakness;RUE deficits/detail RUE Deficits / Details: pt  reports she fell on her RUE a few years ago and lost function; limited to about 90* flexion & abduction of shoulder RUE Coordination: decreased fine motor   Lower Extremity Assessment Lower Extremity Assessment: Defer to PT evaluation   Cervical / Trunk Assessment Cervical / Trunk Assessment: Kyphotic   Communication Communication Communication: HOH;Other (comment) (L hearing aide)   Cognition Arousal/Alertness: Awake/alert Behavior During Therapy: Anxious Overall Cognitive Status: Impaired/Different from baseline Area of Impairment: Safety/judgement;Following commands;Memory;Problem solving                     Memory: Decreased short-term memory Following Commands: Follows one step commands with increased time;Follows multi-step commands inconsistently Safety/Judgement: Decreased awareness of safety;Decreased awareness of deficits   Problem Solving: Slow processing;Difficulty sequencing;Requires verbal cues General Comments: Pt with anxiety and fearful of falling. Some decreased memory but functional for ADLs. Reports inability to stand or walk but may not be open to rehab to maximize safety at home   General Comments  received on 2 L o2, does not wear at home per pt. 100% on 2 L O2, 90% on RA with activity. placed 1 L O2 back on for comfort    Exercises     Shoulder Instructions      Home Living Family/patient expects to be discharged to:: Private residence Living Arrangements: Spouse/significant other;Non-relatives/Friends (lives with boyfriend of 68 years) Available Help at Discharge: Family;Friend(s);Available PRN/intermittently Type of Home: House Home Access: Stairs to enter [employer] of Steps: 7 Entrance Stairs-Rails: Right;Left Home Layout: One level     Bathroom Shower/Tub: Walk-in shower (does not use due to fear of falling)   Bathroom Toilet: Handicapped height (BSC over toilet)     Home Equipment: Bedside commode;Walker - 4 wheels;Grab  bars - toilet;Other (comment);Shower seat - built in [employer] in hallway from living room to bathroom)   Additional Comments: [name] and [name] live with pt; neither of which can physically assist pt      Prior Functioning/Environment Level of Independence: Independent with assistive device(s)        Comments: Independent with ADLs, mobility and IADLs using Rollator. Does sponge baths due to fear of falling in shower. Hx of 9 falls (within a few years? pt unsure of timeframe). Recent fall with R hip fx and increasing difficulty getting around - discharged to [facility] for [facility] and then returned home. Helps boyfriend with IADLs. retired Chief Operating Officer, [employer] of [employer]        OT Problem List: Decreased strength;Decreased range of motion;Decreased activity tolerance;Impaired balance (sitting and/or standing);Decreased safety awareness;Decreased knowledge of use of DME or AE;Decreased knowledge of precautions;Pain;Cardiopulmonary status limiting activity;Decreased cognition      OT Treatment/Interventions: Self-care/ADL training;Therapeutic exercise;DME and/or AE instruction;Therapeutic activities;Patient/family education;Balance training    OT Goals(Current goals can be found in the care plan section) Acute Rehab OT Goals Patient Stated Goal: go home OT Goal Formulation: With patient Time For Goal Achievement: 10/07/20 Potential to Achieve Goals: Fair ADL Goals Pt Will Perform Upper Body Bathing: with modified independence;sitting Pt Will Perform Lower Body Bathing: with min assist;sitting/lateral leans;sit to/from stand Pt Will Transfer to Toilet:  with min assist;stand pivot transfer;bedside commode Additional ADL Goal #1: Pt to sit EOB > 5 min with no more than supervision needed to maintain balance during ADL tasks Additional ADL Goal #2: Pt to verbalize at least 3 fall prevention strategies to maximize safety in home environment  OT Frequency: Min 2X/week   Barriers to  D/C: Decreased caregiver support;Inaccessible home environment          Co-evaluation              AM-PAC OT "6 Clicks" Daily Activity     Outcome Measure Help from another person eating meals?: None Help from another person taking care of personal grooming?: A Laufer Help from another person toileting, which includes using toliet, bedpan, or urinal?: Total Help from another person bathing (including washing, rinsing, drying)?: A Lot Help from another person to put on and taking off regular upper body clothing?: A Coy Help from another person to put on and taking off regular lower body clothing?: Total 6 Click Score: 14   End of Session Equipment Utilized During Treatment: Oxygen Nurse Communication: Mobility status;Other (comment) (dizziness)  Activity Tolerance: Other (comment) (limited by anxiety) Patient left: in bed;with call bell/phone within reach;with bed alarm set  OT Visit Diagnosis: Other abnormalities of gait and mobility (R26.89);History of falling (Z91.81);Muscle weakness (generalized) (M62.81);Pain Pain - Right/Left: Right Pain - part of body: Leg;Hip                Time: 6468-0321 OT Time Calculation (min): 31 min Charges:  OT General Charges $OT Visit: 1 Visit OT Evaluation $OT Eval Moderate Complexity: 1 Mod OT Treatments $Therapeutic Activity: 8-22 mins  Malachy Chamber, OTR/L Acute Rehab Services Office: [phone]   Layla Maw 09/23/2020, 10:02 AM

## 2020-09-23 NOTE — TOC Initial Note (Signed)
Transition of Care [facility]) - Initial/Assessment Note    Patient Details  Name: [name] MRN: [id number] Date of Birth: 04/15/1927  Transition of Care [facility]) CM/SW Contact:    Vinie Sill, LCSW Phone Number: 09/23/2020, 2:11 PM  Clinical Narrative:                   CSW spoke with patient's daughter,Susie. CSW introduced self and explained role. CSW discussed therapy recommendation of short term rehab at [facility]. She states patient has been to rehab at [facility] before and did not have a good experience. She is currently discussing placement with [facility]. She is agreeable to CSW starting SNF search as back up. CSW explained the SNF process. Patient has received covid vaccine. All questions answered.   CSW will provide bed offers once available. CSW will continue to follow and assist with discharge planning.  Thurmond Butts, MSW, LCSW Clinical Social Worker    Expected Discharge Plan: Skilled Nursing Facility Barriers to Discharge: Continued Medical Work up, SNF Pending bed offer   Patient Goals and CMS Choice        Expected Discharge Plan and Services Expected Discharge Plan: [place] In-house Referral: Clinical Social Work                                            Prior Living Arrangements/Services   Lives with:: Self Patient language and need for interpreter reviewed:: No        Need for Family Participation in Patient Care: Yes (Comment) Care giver support system in place?: Yes (comment)   Criminal Activity/Legal Involvement Pertinent to Current Situation/Hospitalization: No - Comment as needed  Activities of Daily Living      Permission Sought/Granted Permission sought to share information with : Family Supports    Share Information with NAME: [name]  Permission granted to share info w AGENCY: SNFs  Permission granted to share info w Relationship: granddaugter  Permission granted to share info w Contact Information:  [phone]  Emotional Assessment       Orientation: : Oriented to Self, Oriented to Place, Oriented to  Time, Oriented to Situation Alcohol / Substance Use: Not Applicable Psych Involvement: No (comment)  Admission diagnosis:  SIRS (systemic inflammatory response syndrome) (HCC) [R65.10] Altered mental status, unspecified altered mental status type [R41.82] Altered mental status, unspecified [R41.82] Patient Active Problem List   Diagnosis Date Noted   Atrial fibrillation with RVR ([place]) 09/22/2020   Cerebral atrophy ([place]) 09/22/2020   Atherosclerosis of aorta ([place]) 09/22/2020   Osteoporosis 09/22/2020   Bilirubinemia 09/22/2020   Leukocytosis 09/22/2020   Pyuria 09/22/2020   History of recurrent UTIs 09/22/2020   Hearing loss 09/22/2020   Chronic respiratory failure with hypoxia, on home O2 therapy ([place]) 09/22/2020   SIRS (systemic inflammatory response syndrome) ([place])    Altered mental status, unspecified 09/21/2020   Postoperative anemia due to acute blood loss 09/13/2020   Displaced intertrochanteric fracture of right femur, initial encounter for closed fracture ([place]) 09/05/2020   Hip fracture due to osteoporosis with routine healing 09/05/2020   Financial difficulty 02/26/2020   UTI (urinary tract infection) 10/01/2019   CKD stage 3 due to type 2 diabetes mellitus ([place]) 09/02/2019   History of CVA in adulthood 08/23/2019   Polypharmacy 07/27/2019   E coli bacteremia 02/13/2019   Dyspnea    Chronic leg pain 11/14/2018  Atherosclerosis of native arteries of the extremities with ulceration ([place]) 08/21/2018   Chronic constipation 06/18/2018   Advanced directives, counseling/discussion 01/29/2018   Bilateral hearing loss 01/29/2018   Spinal stenosis of lumbosacral region 17/40/8144   Systolic murmur 81/85/6314   De Quervain's tenosynovitis, left 01/09/2017   Encounter for chronic pain management 02/09/2016   Paroxysmal atrial fibrillation (HCC)    Varicose veins of  left lower extremity with inflammation, with ulcer of calf limited to breakdown of skin ([place]) 05/22/2015   Varicose veins of right lower extremity with inflammation 01/23/2015   Diabetes mellitus type 2 with peripheral artery disease (HCC)    Chronic venous insufficiency    Bleeding from varicose veins of lower extremity, left 02/13/2014   Diabetic polyneuropathy ([place]) 11/19/2013   Right shoulder pain 08/19/2013   Abdominal pain 05/14/2012   Unsteady gait 12/13/2011   Chronic lower back pain 09/14/2011   Hyperlipidemia associated with type 2 diabetes mellitus ([place]) 09/06/2010   GAD (generalized anxiety disorder) 03/15/2010   Obesity, Class I, BMI 30-34.9 03/15/2010   Hypertension associated with type 2 diabetes mellitus ([place]) 03/15/2010   (HFpEF) heart failure with preserved ejection fraction ([place]) 03/15/2010   Coronary artery disease    PCP:  Ria Bush, MD Pharmacy:  No Pharmacies Listed    Social Determinants of Health (SDOH) Interventions    Readmission Risk Interventions Readmission Risk Prevention Plan 09/04/2019  Transportation Screening Complete  PCP or Specialist Appt within 3-5 Days Complete  HRI or [place] Complete  Palliative Care Screening Not Applicable  Medication Review (RN Care Manager) Complete  Some recent data might be hidden

## 2020-09-23 NOTE — Progress Notes (Signed)
MEW score is red due to HR 130-140, Atrial fibrillation with RVR on monitor. BP 98/80 mmHg. Pt is feeling of heart palpitation and anxious. Pt requests Xanax at night time. Dr Rock Nephew, on call provider is notified. Order received for Metoprolol  5 mg IV and Xanax. Please see on MAR.  We will continue to monitor.   Kennyth Lose, RN

## 2020-09-24 LAB — BASIC METABOLIC PANEL
Anion gap: 6 (ref 5–15)
BUN: 11 mg/dL (ref 8–23)
CO2: 26 mmol/L (ref 22–32)
Calcium: 8.7 mg/dL — ABNORMAL LOW (ref 8.9–10.3)
Chloride: 102 mmol/L (ref 98–111)
Creatinine, Ser: 0.79 mg/dL (ref 0.44–1.00)
GFR, Estimated: >60 mL/min (ref >60)
Glucose, Bld: 136 mg/dL — ABNORMAL HIGH (ref 70–99)
Potassium: 3.7 mmol/L (ref 3.5–5.1)
Sodium: 134 mmol/L — ABNORMAL LOW (ref 135–145)

## 2020-09-24 LAB — CBC
HCT: 34 % — ABNORMAL LOW (ref 36.0–46.0)
Hemoglobin: 10.6 g/dL — ABNORMAL LOW (ref 12.0–15.0)
MCH: 29 pg (ref 26.0–34.0)
MCHC: 31.2 g/dL (ref 30.0–36.0)
MCV: 93.2 fL (ref 80.0–100.0)
Platelets: 367 10*3/uL (ref 150–400)
RBC: 3.65 MIL/uL — ABNORMAL LOW (ref 3.87–5.11)
RDW: 15.9 % — ABNORMAL HIGH (ref 11.5–15.5)
WBC: 10.4 10*3/uL (ref 4.0–10.5)
nRBC: 0 % (ref 0.0–0.2)

## 2020-09-24 LAB — GLUCOSE, CAPILLARY
Glucose-Capillary: 149 mg/dL — ABNORMAL HIGH (ref 70–99)
Glucose-Capillary: 189 mg/dL — ABNORMAL HIGH (ref 70–99)
Glucose-Capillary: 116 mg/dL — ABNORMAL HIGH (ref 70–99)
Glucose-Capillary: 159 mg/dL — ABNORMAL HIGH (ref 70–99)

## 2020-09-24 LAB — SARS CORONAVIRUS 2 (TAT 6-24 HRS): SARS Coronavirus 2: NEGATIVE

## 2020-09-24 MED ORDER — MELATONIN 3 MG PO TABS
3.0000 mg | ORAL_TABLET | Freq: Every day | ORAL | Status: DC
  Administered 2020-09-24 – 2020-09-25 (×2): 3 mg via ORAL
  Filled 2020-09-24 (×2): qty 1

## 2020-09-24 MED ORDER — FUROSEMIDE 20 MG PO TABS
20.0000 mg | ORAL_TABLET | Freq: Every day | ORAL | Status: DC
  Administered 2020-09-25: 20 mg via ORAL
  Filled 2020-09-24: qty 1

## 2020-09-24 MED ORDER — ALPRAZOLAM 0.5 MG PO TABS
0.5000 mg | ORAL_TABLET | Freq: Every evening | ORAL | Status: DC | PRN
  Administered 2020-09-24 – 2020-09-25 (×2): 0.5 mg via ORAL
  Filled 2020-09-24 (×2): qty 1

## 2020-09-24 NOTE — TOC Progression Note (Addendum)
Transition of Care [facility]) - Progression Note    Patient Details  Name: [name] MRN: [id number] Date of Birth: 1927-09-05  Transition of Care [facility]) CM/SW [place], [place] Phone Number: 09/24/2020, 11:58 AM  Clinical Narrative:     [facility] has made bed offer- patient's granddaughter has accepted bed offer-SNF can admit tomorrow if medically stable-  RN updated & covid test   Thurmond Butts, MSW, LCSW Clinical Social Worker    Expected Discharge Plan: Skilled Nursing Facility Barriers to Discharge: Continued Medical Work up, SNF Pending bed offer  Expected Discharge Plan and Services Expected Discharge Plan: [place] In-house Referral: Clinical Social Work                                             Social Determinants of Health (SDOH) Interventions    Readmission Risk Interventions Readmission Risk Prevention Plan 09/04/2019  Transportation Screening Complete  PCP or Specialist Appt within 3-5 Days Complete  HRI or Home Care Consult Complete  Palliative Care Screening Not Applicable  Medication Review (RN Care Manager) Complete  Some recent data might be hidden

## 2020-09-24 NOTE — Progress Notes (Signed)
Family Medicine Teaching Service Daily Progress Note Intern Pager: [phone]  Patient name: [name] Medical record number: [id number] Date of birth: January 03, 1928 Age: 87 y.o. Gender: female  Primary Care Provider: Ria Bush, MD Consultants: None Code Status: DNR  Pt Overview and Major Events to Date:  6/20-Admitted  Assessment and Plan: [name] is a 87 y.o. female presenting with Altered mental status. PMH is significant for T2DM, HLD, HFpEF (EF 50-55%), depression/anxiety, GERD, CAD, CKD III, h/o skin cancer, chronic pain 2/2 spinal stenosis,  right femoral neck fracture s/p intramedullary nail.    Patient medically stable for discharge.  Right femoral neck fracture s/p ORIF on 09/07/20 Pain still well controlled.  Patient indicated did well with PT yesterday. - Continue scheduled Tylenol - Decrease Oxycodone to 2.5 mg q8hr - PT/OT eval and treat  Atrial Fibrillation on chronic anticoagulation  Hypertension Patient now rate controlled and BP's in 140's-150's.  [facility] SNF Placement. - Continue to follow-up with social work   FEN/GI: Heart healthy diet PPx: Eliquis Dispo:SNF today. Barriers include SNF placement.   Subjective:  Patient indicates feeling well and did better with PT/OT.  Indicates did have trouble sleeping.    Objective: Temp:  [97.5 F (36.4 C)-98.6 F (37 C)] 98.5 F (36.9 C) (06/23 1112) Pulse Rate:  [81-89] 83 (06/23 1112) Resp:  [20] 20 (06/23 1112) BP: (122-155)/(66-81) 148/74 (06/23 1112) SpO2:  [95 %-97 %] 95 % (06/23 1112) Physical Exam:  Physical Exam HENT:     Head: Normocephalic and atraumatic.     Mouth/Throat:     Mouth: Mucous membranes are moist.  Cardiovascular:     Rate and Rhythm: Normal rate and regular rhythm.     Pulses: Normal pulses.  Pulmonary:     Effort: Pulmonary effort is normal.  Abdominal:     General: Abdomen is flat. Bowel sounds are normal.     Palpations: Abdomen is soft.  Skin:     General: Skin is warm.  Neurological:     General: No focal deficit present.     Mental Status: She is alert.     Laboratory: Recent Labs  Lab 09/22/20 0143 09/23/20 0850 09/24/20 0101  WBC 13.7* 10.1 10.4  HGB 11.5* 10.3* 10.6*  HCT 37.8 32.7* 34.0*  PLT 362 331 367   Recent Labs  Lab 09/21/20 1222 09/22/20 0143 09/23/20 0850 09/24/20 0101  NA 139 139 137 134*  K 3.6 3.5 3.7 3.7  CL 98 103 104 102  CO2 32 27 27 26   BUN 18 17 15 11   CREATININE 1.00 0.73 0.77 0.79  CALCIUM 9.1 8.8* 8.5* 8.7*  PROT 6.9  --   --   --   BILITOT 1.9*  --   --   --   ALKPHOS 144*  --   --   --   ALT 41  --   --   --   AST 37  --   --   --   GLUCOSE 173* 160* 199* 136*    Imaging/Diagnostic Tests: No new imaging  Delora Fuel, MD 09/24/2020, 2:19 PM PGY-1, [place] Intern pager: [phone], text pages welcome

## 2020-09-25 ENCOUNTER — Other Ambulatory Visit (HOSPITAL_COMMUNITY): Payer: Self-pay

## 2020-09-25 DIAGNOSIS — R4182 Altered mental status, unspecified: Secondary | ICD-10-CM

## 2020-09-25 LAB — GLUCOSE, CAPILLARY
Glucose-Capillary: 135 mg/dL — ABNORMAL HIGH (ref 70–99)
Glucose-Capillary: 196 mg/dL — ABNORMAL HIGH (ref 70–99)
Glucose-Capillary: 128 mg/dL — ABNORMAL HIGH (ref 70–99)

## 2020-09-25 MED ORDER — FUROSEMIDE 20 MG PO TABS: 20.0000 mg | ORAL_TABLET | Freq: Every day | ORAL | Status: DC

## 2020-09-25 MED ORDER — OXYCODONE HCL 5 MG PO TABS: 2.5000 mg | ORAL_TABLET | Freq: Three times a day (TID) | ORAL | 0 refills | Status: DC | PRN

## 2020-09-25 MED ORDER — ACETAMINOPHEN 325 MG PO TABS: 650.0000 mg | ORAL_TABLET | Freq: Four times a day (QID) | ORAL | Status: AC

## 2020-09-25 MED ORDER — POLYETHYLENE GLYCOL 3350 17 GM/SCOOP PO POWD
17.0000 g | Freq: Every day | ORAL | 0 refills | Status: AC
  Filled 2020-09-25: qty 510, 30d supply, fill #0

## 2020-09-25 MED ORDER — SERTRALINE HCL 100 MG PO TABS: 100.0000 mg | ORAL_TABLET | Freq: Every day | ORAL | Status: AC

## 2020-09-25 MED ORDER — MELATONIN 3 MG PO TABS: 3.0000 mg | ORAL_TABLET | Freq: Every day | ORAL | 0 refills | Status: AC

## 2020-09-25 NOTE — TOC Transition Note (Signed)
Transition of Care [facility]) - CM/SW Discharge Note   Patient Details  Name: [name] MRN: [id number] Date of Birth: [birth year]  [facility]) CM/SW Contact:  Vinie Sill, LCSW Phone Number: 09/25/2020, 2:14 PM   Clinical Narrative:     Patient will Discharge to: [place] Discharge Date: 09/25/2020 Family Notified: granddaughter,Susie Transport JQ:[facility]AL  Per MD patient is ready for discharge. RN, patient, and facility notified of discharge. Discharge Summary sent to facility. RN given number for [phone]. Ambulance transport requested for patient.   Clinical Social Worker signing off.  Thurmond Butts, MSW, LCSW Clinical Social Worker    Final next level of care: Skilled Nursing Facility Barriers to Discharge: Barriers Resolved   Patient Goals and CMS Choice        Discharge Placement              Patient [facility] bed at[facility] Patient to be transferred to facility by: [place] Name of family member notified: daughter Patient and family notified of of transfer: 09/25/20  Discharge Plan and Services In-house Referral: Clinical Social Work                                   Social Determinants of Health (SDOH) Interventions     Readmission Risk Interventions Readmission Risk Prevention Plan 09/04/2019  Transportation Screening Complete  PCP or Specialist Appt within 3-5 Days Complete  HRI or Home Care Consult Complete  Palliative Care Screening Not Applicable  Medication Review (RN Care Manager) Complete  Some recent data might be hidden

## 2020-09-25 NOTE — Progress Notes (Signed)
Physical Therapy Treatment Patient Details Name: [name] MRN: [id number] DOB: 1928/02/05 Today's Date: 09/25/2020    History of Present Illness [name] is a 87 y.o. female presenting with right hip fracture after mechanical fall at home, s/p IM nail on 6/6. PMH is significant for paroxsmal afib on eliquis, h/o provoked DVT, T2DM, HLD, HFpEF (EF 50-55%), depression/anxiety, GERD, CAD, CKD III, h/o skin cancer, chronic pain 2/2 spinal stenosis.    PT Comments    Pt received in supine, agreeable to therapy session and with good participation and tolerance for transfer training. Pt needing +2 mod to maxA for bed mobility and stand pivot transfer to chair, limited due to reported dizziness however BP stable with positional changes and no nystagmus observed. Increased time needed to initiate/perform each 1-step task due to pt anxiety, fatigue and pain. VSS with exertion on 2L O2 [place], (pt did desat to 84% on 1L with exertion and notable shortness of breath). Pt continues to benefit from PT services to progress toward functional mobility goals.   Follow Up Recommendations  SNF;Supervision/Assistance - 24 hour     Equipment Recommendations  Rolling walker with 5" wheels    Recommendations for Other Services       Precautions / Restrictions Precautions Precautions: Fall Precaution Comments: anxious, watch HR, R hip fx/sx 09/07/20 Restrictions Weight Bearing Restrictions: Yes RLE Weight Bearing: Weight bearing as tolerated    Mobility  Bed Mobility Overal bed mobility: Needs Assistance Bed Mobility: Supine to Sit Rolling: Mod assist Sidelying to sit: Mod assist;+2 for physical assistance;HOB elevated       General bed mobility comments: cued for direction and sequencing.  pt able to reach cross-body for rail but needs modA with pad assist to advance hips further and for trunk rise but good initiation.    Transfers Overall transfer level: Needs assistance Equipment used:  Rolling walker (2 wheeled) Transfers: Sit to/from [facility] Sit to Stand: Max assist;+2 physical assistance;+2 safety/equipment Stand pivot transfers: Max assist;+2 physical assistance;+2 safety/equipment       General transfer comment: using RW, cues for posture and sequencing, mod/maxA +2 due to posterior lean and difficulty weight shifting onto RLE for LLE stepping  Ambulation/Gait             General Gait Details: unable due to patients anxiety and fatiguing quickly with stand pivot   Stairs             Wheelchair Mobility    Modified Rankin (Stroke Patients Only)       Balance Overall balance assessment: Needs assistance Sitting-balance support: Feet supported;Bilateral upper extremity supported Sitting balance-Leahy Scale: Fair Sitting balance - Comments: prefers UE support; c/o dizziness but BP stable and no nystagmus visible Postural control: Posterior lean (in stance) Standing balance support: Bilateral upper extremity supported Standing balance-Leahy Scale: Zero Standing balance comment: +2 max for standing balance at RW, posterior lean t/o; c/o dizziness                            Cognition Arousal/Alertness: Awake/alert Behavior During Therapy: Anxious;WFL for tasks assessed/performed Overall Cognitive Status: Impaired/Different from baseline Area of Impairment: Safety/judgement;Following commands;Memory;Problem solving;Attention                   Current Attention Level: Sustained Memory: Decreased short-term memory;Decreased recall of precautions Following Commands: Follows one step commands with increased time;Follows multi-step commands inconsistently Safety/Judgement: Decreased awareness of safety;Decreased awareness of  deficits   Problem Solving: Slow processing;Difficulty sequencing;Requires verbal cues;Requires tactile cues General Comments: Pt with anxiety and some decreased memory but functional for  transfers with increased time and very HoH (hearing aide was in but not on initially, PTA turned it on and she did much better).      Exercises General Exercises - Lower Extremity Ankle Circles/Pumps: AROM;Both;10 reps;Supine Quad Sets: AROM;Supine;Both;5 reps Gluteal Sets: AROM;Both;10 reps;Seated Long Arc Quad: AROM;Both;10 reps;Seated    General Comments General comments (skin integrity, edema, etc.): desat to 84% with exertion on 1L so increased to 2L [place] and SpO2 improved to 88-91% then >92% within 3 mins; BP 137/63 (85) seated, c/o dizziness, no nystagmus visible      Pertinent Vitals/Pain Pain Assessment: Faces Faces Pain Scale: Hurts even more Pain Location: R LE/hip Pain Descriptors / Indicators: Grimacing;Guarding Pain Intervention(s): Limited activity within patient's tolerance;Monitored during session;Repositioned    Home Living                      Prior Function            PT Goals (current goals can now be found in the care plan section) Acute Rehab PT Goals Patient Stated Goal: go home PT Goal Formulation: With patient Time For Goal Achievement: 10/07/20 Progress towards PT goals: Progressing toward goals    Frequency    Min 3X/week      PT Plan Current plan remains appropriate    Co-evaluation              AM-PAC PT "6 Clicks" Mobility   Outcome Measure  Help needed turning from your back to your side while in a flat bed without using bedrails?: A Lot Help needed moving from lying on your back to sitting on the side of a flat bed without using bedrails?: A Lot Help needed moving to and from a bed to a chair (including a wheelchair)?: Total (+2 maxA for stand pivot) Help needed standing up from a chair using your arms (e.g., wheelchair or bedside chair)?: A Lot Help needed to walk in hospital room?: Total Help needed climbing 3-5 steps with a railing? : Total 6 Click Score: 9    End of Session Equipment Utilized During Treatment:  Gait belt;Oxygen Activity Tolerance: Other (comment);Patient tolerated treatment well (anxiety/dizziness) Patient left: in chair;with call bell/phone within reach;with chair alarm set Nurse Communication: Mobility status;Other (comment) (may need to ask mobility tech for assist and do scoot transfer back to bed if pt too fatigued to stand pivot back; desat on 1L [place] needed 2L with exertion) PT Visit Diagnosis: Unsteadiness on feet (R26.81);Other abnormalities of gait and mobility (R26.89);Pain;Difficulty in walking, not elsewhere classified (R26.2) Pain - part of body: Hip;Leg     Time: 0272-5366 PT Time Calculation (min) (ACUTE ONLY): 25 min  Charges:  $Therapeutic Exercise: 8-22 mins $Therapeutic Activity: 8-22 mins                     Harman Ferrin P., PTA Acute Rehabilitation Services Pager: [phone] Office: [place] 09/25/2020, 1:46 PM

## 2020-09-25 NOTE — Hospital Course (Signed)
[name] is a 87 y.o. female presenting with Altered mental status. PMH is significant for T2DM, HLD, HFpEF (EF 50-55%), depression/anxiety, GERD, CAD, CKD III, h/o skin cancer, chronic pain 2/2 spinal stenosis,  right femoral neck fracture s/p intramedullary nail.  AMS w/ concern for sepsis, etiology unknown Patient presented in obtunded state with confusion and hallucinations on previous day.  On previous day had hallucinations and confusion.  Thought likely due to sedation of medication.  Pinpoint pupils on exam.  Good air movement in lungs and airway protected.  GCS score of 14. Able to awaken to voice and state name then woul;d go back to sleep, move all 4 extremities, and responded to commands.    Patient on Hydrocone 5 q6hr PRN, Robaxin q6hr PRN and Xanax 0.5 mg at night at SNF, but Met SIRS criteria given Tachycardia, Hypotension, and AMS so started Sepsis protocol. Lactic acid normal.  Electrolytes and glucose normal.  Head CT unchanged from previous.  Able to awaken and state name, move all 4 extremities, and responding to commands.  Patient started on IV fluids.  Started on Vanc, Cefepime and Flagyl.  Bcx negative x2.  UA positive.  Narrowed to Cefepime and received 3 day course to treat UTI.  Returned to normal mental baseline on next day.  Had good oral intake and had good urine output and regular stools on Miralax daily.  Remained stable on decreased medication.   Right femoral neck fracture s/p ORIF on 09/07/20 Given sedation, opioid regimen held.  When patient awakened, started on scheduled Tylenol PO.  Eventually started on Oxycodone 2.5 mg q8hr.  Patient tolerated this well and did not require further increase.  Patient did well on this decreased regimen, Robaxin not restarted.     Atrial Fibrillation on chronic anticoagulation Patient presented in A-Fib with RVR.  Also borderline Hypotensive on presentation.  Received 2 doses IV Metoprolol to help control.  Gradually restarted on  home medications of Eliquis $RemoveBe'5mg'kbyaCJLsk$  BID, Lopressor $RemoveBefore'50mg'CakgcHWzkkVyB$  BID, and diltiazem 120 mg daily.  Heart rate stabilized and patient became rate controlled.  Insomnia Patient on Xanax 0.5 qhs mg at home.  Restarted on 0.25 mg qhs once no longer obtunded, then increased to home dose without issue.

## 2020-09-25 NOTE — Consult Note (Signed)
   [facility] CM Inpatient Consult   09/25/2020  [name] 08/26/1927 [id number]  [employer] [ACO] Patient: Medicare CMS DCE  Primary Care Provider:  Ria Bush, MD  Patient screened for less than 7 day re-hospitalization with noted high risk score for unplanned readmission risk and  to assess for potential [facility] service needs for post hospital transition.  Review of patient's medical record reveals patient is transitioning to a skilled nursing facility for post hospital level of care.  Plan: If patient admits to a [facility] affiliated skilled nursing facility, will alert [facility] RN [facility] with traditional Medicare for any known or needs for transitional care needs for returning to post facility care or complex disease management.  For questions or referrals, please contact:   Natividad Brood, RN BSN [facility] Liaison  [phone] business mobile phone Toll free office [phone]  Fax number: [phone] [email or web address]

## 2020-09-25 NOTE — Progress Notes (Signed)
Mobility Specialist: Progress Note   09/25/20 1546  Mobility  Activity Transferred:  Chair to bed  Level of Assistance +2 (takes two people)  Information systems manager Ambulated (ft) 2 ft  Mobility Out of bed to chair with meals  Mobility Response Tolerated fair  Mobility performed by Mobility specialist;Other (comment) (NT)  $Mobility charge 1 Mobility   Pt was assisted back to bed per request with assistance from the NT as well. Pt required minA to stand from chair as well as verbal cues for direction and RW placement. After sitting EOB pt attempted to stand again to side step to the head of the bed requiring +2 maxA. Pt back in the bed with NT present in the room.   [facility] [place] Mobility Specialist Mobility Specialist Phone: [phone]

## 2020-09-25 NOTE — Discharge Summary (Signed)
[facility] Discharge Summary  Patient name: [name] Medical record number: [id number] Date of birth: [birth year] Age: 87 y.o. Gender: female Date of Admission: 09/21/2020  Date of Discharge: 09/25/20 Admitting Physician: Blane Ohara McDiarmid, MD  Primary Care Provider: Ria Bush, MD Consultants: None  Indication for Hospitalization: Altered mental status  Discharge Diagnoses/Problem List:  T2DM, HLD, HFpEF (EF 50-55%), depression/anxiety, GERD, CAD, CKD III, h/o skin cancer, chronic pain 2/2 spinal stenosis,  right femoral neck fracture s/p intramedullary nail.  Disposition: Medically stable to be discharged to SNF  Discharge Condition: Stable  Discharge Exam:   Physical Exam HENT:     Head: Normocephalic and atraumatic.     Mouth/Throat:     Mouth: Mucous membranes are moist.  Cardiovascular:     Rate and Rhythm: Normal rate and regular rhythm.     Pulses: Normal pulses.  Pulmonary:     Effort: Pulmonary effort is normal.     Breath sounds: Normal breath sounds.  Skin:    General: Skin is warm.  Neurological:     General: No focal deficit present.     Mental Status: She is alert. Mental status is at baseline.  Psychiatric:        Mood and Affect: Mood normal.        Behavior: Behavior normal.     Brief Hospital Course:  [name] is a 87 y.o. female presenting with Altered mental status. PMH is significant for T2DM, HLD, HFpEF (EF 50-55%), depression/anxiety, GERD, CAD, CKD III, h/o skin cancer, chronic pain 2/2 spinal stenosis,  right femoral neck fracture s/p intramedullary nail.  AMS w/ concern for sepsis, etiology unknown Patient presented in obtunded state with confusion and hallucinations on previous day.  On previous day had hallucinations and confusion.  Thought likely due to sedation of medication.  Pinpoint pupils on exam.  Good air movement in lungs and airway protected.  GCS score of 14. Able to awaken to voice  and state name then woul;d go back to sleep, move all 4 extremities, and responded to commands.    Patient on Hydrocone 5 q6hr PRN, Robaxin q6hr PRN and Xanax 0.5 mg at night at SNF, but Met SIRS criteria given Tachycardia, Hypotension, and AMS so started Sepsis protocol. Lactic acid normal.  Electrolytes and glucose normal.  Head CT unchanged from previous.  Able to awaken and state name, move all 4 extremities, and responding to commands.  Patient started on IV fluids.  Started on Vanc, Cefepime and Flagyl.  Bcx negative x2.  UA positive.  Narrowed to Cefepime and received 3 day course to treat UTI.  Returned to normal mental baseline on next day.  Had good oral intake and had good urine output and regular stools on Miralax daily.  Remained stable on decreased medication.   Right femoral neck fracture s/p ORIF on 09/07/20 Given sedation, opioid regimen held.  When patient awakened, started on scheduled Tylenol PO.  Eventually started on Oxycodone 2.5 mg q8hr.  Patient tolerated this well and did not require further increase.  Patient did well on this decreased regimen, Robaxin not restarted.     Atrial Fibrillation on chronic anticoagulation Patient presented in A-Fib with RVR.  Also borderline Hypotensive on presentation.  Received 2 doses IV Metoprolol to help control.  Gradually restarted on home medications of Eliquis 78m BID, Lopressor 568mBID, and diltiazem 120 mg daily.  Heart rate stabilized and patient became rate controlled.  Insomnia Patient on Xanax  0.5 qhs mg at home.  Restarted on 0.25 mg qhs once no longer obtunded, then increased to home dose without issue.   Issues for Follow Up:  AMS- Closely monitor medications to ensure patient doesn't become too sedated Continue PT therapy  Consider bisphosphonate therapy for osteoporosis. Monitor O2 requirement.  Continue to encourage incentive spirometer and continued ambulation of PT. 4.   Appt with Dr Cathie Olden office, PA - Laurann [place],  on 10/02/20 from 3:15 PM for ECHO  Significant Procedures: None  Significant Labs and Imaging:  Recent Labs  Lab 09/22/20 0143 09/23/20 0850 09/24/20 0101  WBC 13.7* 10.1 10.4  HGB 11.5* 10.3* 10.6*  HCT 37.8 32.7* 34.0*  PLT 362 331 367   Recent Labs  Lab 09/21/20 1222 09/22/20 0143 09/23/20 0850 09/24/20 0101  NA 139 139 137 134*  K 3.6 3.5 3.7 3.7  CL 98 103 104 102  CO2 32 _0 GLUCOSE 173* 160* 199* 136*  BUN _1 CREATININE 1.00 0.73 0.77 0.79  CALCIUM 9.1 8.8* 8.5* 8.7*  ALKPHOS 144*  --   --   --   AST 37  --   --   --   ALT 41  --   --   --   ALBUMIN 2.7*  --   --   --    EXAM: PORTABLE CHEST 1 VIEW   COMPARISON:  09/11/2020   FINDINGS: Mild bilateral interstitial thickening. No focal consolidation. No pleural effusion or pneumothorax. Stable cardiomegaly. Prior CABG.   No acute osseous abnormality.   IMPRESSION: Cardiomegaly with pulmonary vascular congestion.     Electronically Signed   By: Kathreen Devoid   On: 09/21/2020 12:46   CLINICAL DATA:  87 year old female with altered mental status   EXAM: CT HEAD WITHOUT CONTRAST   TECHNIQUE: Contiguous axial images were obtained from the base of the skull through the vertex without intravenous contrast.   COMPARISON:  08/08/2019 CT and prior studies   FINDINGS: Brain: No evidence of acute infarction, hemorrhage, hydrocephalus, extra-axial collection or mass lesion/mass effect.   Atrophy, chronic small-vessel white matter ischemic changes and remote LEFT frontoparietal white matter infarct noted.   Vascular: Carotid and vertebral atherosclerotic calcifications are noted.   Skull: No acute abnormality.   Sinuses/Orbits: New fluid within the RIGHT sphenoid sinus is noted.   Other: None   IMPRESSION: 1. No evidence of acute intracranial abnormality. 2. Atrophy, chronic small-vessel white matter ischemic changes and remote LEFT frontoparietal white matter infarct. 3. New  RIGHT sphenoid sinus fluid/acute sinusitis.     Electronically Signed   By: Margarette Canada M.D.   On: 09/21/2020 13:59     Results/Tests Pending at Time of Discharge: None  Discharge Medications:  Allergies as of 09/25/2020       Reactions   Metformin And Related Diarrhea        Medication List     STOP taking these medications    HYDROcodone-acetaminophen 5-325 MG tablet Commonly known as: NORCO/VICODIN   MILK OF MAGNESIA PO   nystatin ointment Commonly known as: MYCOSTATIN   polyethylene glycol 17 g packet Commonly known as: MIRALAX / GLYCOLAX Replaced by: polyethylene glycol powder 17 GM/SCOOP powder   senna 8.6 MG Tabs tablet Commonly known as: SENOKOT       TAKE these medications    acetaminophen 325 MG tablet Commonly known as: TYLENOL Take 2 tablets (650 mg total) by mouth every 6 (six) hours.   Electronics engineer  4 MG Caps Take 1 capsule (4 mg total) by mouth daily.   ALPRAZolam 1 MG tablet Commonly known as: XANAX Take 0.5 tablets (0.5 mg total) by mouth at bedtime as needed for anxiety.   alum & mag hydroxide-simeth 623-762-83 MG/5ML suspension Commonly known as: MAALOX PLUS Take 30 mLs by mouth every 4 (four) hours as needed for indigestion. Almacone-2 What changed: Another medication with the same name was removed. Continue taking this medication, and follow the directions you see here.   B-complex with vitamin C tablet Take 1 tablet by mouth every morning.   bisacodyl 10 MG suppository Commonly known as: DULCOLAX Place 10 mg rectally daily as needed (constipation not relieved by MOM).   diclofenac sodium 1 % Gel Commonly known as: VOLTAREN Apply 1 application topically 3 (three) times daily.   Dilt-XR 120 MG 24 hr capsule Generic drug: diltiazem TAKE 1 CAPSULE BY MOUTH EVERY DAY What changed:  how much to take when to take this   Eliquis 5 MG Tabs tablet Generic drug: apixaban TAKE 1 TABLET BY MOUTH TWICE A DAY What changed: how much to  take   Ensure Take 237 mLs by mouth every morning.   famotidine 20 MG tablet Commonly known as: Pepcid Take 1 tablet (20 mg total) by mouth daily as needed for heartburn (breakthrough heartburn). What changed: reasons to take this   fluticasone 50 MCG/ACT nasal spray Commonly known as: FLONASE Place 1 spray into both nostrils daily as needed for allergies.   furosemide 20 MG tablet Commonly known as: LASIX Take 1 tablet (20 mg total) by mouth daily. Start taking on: September 26, 2020 What changed:  medication strength how much to take when to take this   GLUCOSAMINE-CHONDROITIN PO Take 1 tablet by mouth every morning.   ICaps Caps Take 1 capsule by mouth every morning.   loratadine 10 MG tablet Commonly known as: CLARITIN Take 10 mg by mouth daily as needed for allergies.   melatonin 3 MG Tabs tablet Take 1 tablet (3 mg total) by mouth at bedtime.   metoprolol tartrate 50 MG tablet Commonly known as: LOPRESSOR TAKE 1 TABLET BY MOUTH TWICE A DAY   nitroGLYCERIN 0.4 MG SL tablet Commonly known as: NITROSTAT Place 1 tablet (0.4 mg total) under the tongue every 5 (five) minutes as needed for chest pain.   ondansetron 4 MG tablet Commonly known as: ZOFRAN Take 1 tablet (4 mg total) by mouth every 6 (six) hours as needed for nausea.   OneTouch Delica Plus [id number] Misc Use to test blood sugar once daily dx code E11.9   OneTouch Verio test strip Generic drug: glucose blood USE TO TEST BLOOD SUGAR ONCE DAILY. DX:E11.9   oxyCODONE 5 MG immediate release tablet Commonly known as: Oxy IR/ROXICODONE Take 0.5 tablets (2.5 mg total) by mouth every 8 (eight) hours as needed for breakthrough pain.   OXYGEN Inhale 2 L into the lungs continuous.   pantoprazole 40 MG tablet Commonly known as: PROTONIX TAKE 1 TABLET BY MOUTH TWICE A DAY What changed: when to take this   pioglitazone 15 MG tablet Commonly known as: ACTOS TAKE 1 TABLET BY MOUTH EVERY DAY What changed:  when to take this   polyethylene glycol powder 17 GM/SCOOP powder Commonly known as: GLYCOLAX/MIRALAX Take 17 g by mouth daily. Start taking on: September 26, 2020 Replaces: polyethylene glycol 17 g packet   potassium chloride 10 MEQ CR capsule Commonly known as: MICRO-K TAKE ONE CAPSULE BY MOUTH ONCE DAILY  What changed: when to take this   pravastatin 20 MG tablet Commonly known as: PRAVACHOL Take 1 tablet (20 mg total) by mouth daily. What changed: when to take this   RA SALINE ENEMA RE Place 1 enema rectally daily as needed (constipation not relieved by Bisacodyl suppository).   sertraline 100 MG tablet Commonly known as: ZOLOFT Take 1 tablet (100 mg total) by mouth daily. Start taking on: September 26, 2020 What changed:  medication strength how much to take   sitaGLIPtin 50 MG tablet Commonly known as: JANUVIA Take 50 mg by mouth every morning. What changed: Another medication with the same name was removed. Continue taking this medication, and follow the directions you see here.   Vitamin E 450 MG (1000 UT) Caps Take 450 mg by mouth every morning.        Discharge Instructions: Please refer to Patient Instructions section of EMR for full details.  Patient was counseled important signs and symptoms that should prompt return to medical care, changes in medications, dietary instructions, activity restrictions, and follow up appointments.   Follow-Up Appointments:   Delora Fuel, MD 09/25/2020, 12:40 PM PGY-1, [place]

## 2020-09-26 DIAGNOSIS — Z87891 Personal history of nicotine dependence: Secondary | ICD-10-CM | POA: Diagnosis not present

## 2020-09-26 DIAGNOSIS — I872 Venous insufficiency (chronic) (peripheral): Secondary | ICD-10-CM | POA: Diagnosis not present

## 2020-09-26 DIAGNOSIS — I129 Hypertensive chronic kidney disease with stage 1 through stage 4 chronic kidney disease, or unspecified chronic kidney disease: Secondary | ICD-10-CM | POA: Diagnosis not present

## 2020-09-26 DIAGNOSIS — M545 Low back pain, unspecified: Secondary | ICD-10-CM | POA: Diagnosis not present

## 2020-09-26 DIAGNOSIS — R0902 Hypoxemia: Secondary | ICD-10-CM | POA: Diagnosis not present

## 2020-09-26 DIAGNOSIS — E785 Hyperlipidemia, unspecified: Secondary | ICD-10-CM | POA: Diagnosis present

## 2020-09-26 DIAGNOSIS — I482 Chronic atrial fibrillation, unspecified: Secondary | ICD-10-CM | POA: Diagnosis present

## 2020-09-26 DIAGNOSIS — Z7901 Long term (current) use of anticoagulants: Secondary | ICD-10-CM | POA: Diagnosis not present

## 2020-09-26 DIAGNOSIS — I517 Cardiomegaly: Secondary | ICD-10-CM | POA: Diagnosis not present

## 2020-09-26 DIAGNOSIS — K5790 Diverticulosis of intestine, part unspecified, without perforation or abscess without bleeding: Secondary | ICD-10-CM | POA: Diagnosis not present

## 2020-09-26 DIAGNOSIS — M2550 Pain in unspecified joint: Secondary | ICD-10-CM | POA: Diagnosis not present

## 2020-09-26 DIAGNOSIS — J9601 Acute respiratory failure with hypoxia: Secondary | ICD-10-CM | POA: Diagnosis not present

## 2020-09-26 DIAGNOSIS — I5033 Acute on chronic diastolic (congestive) heart failure: Secondary | ICD-10-CM | POA: Diagnosis present

## 2020-09-26 DIAGNOSIS — F418 Other specified anxiety disorders: Secondary | ICD-10-CM | POA: Diagnosis not present

## 2020-09-26 DIAGNOSIS — I10 Essential (primary) hypertension: Secondary | ICD-10-CM | POA: Diagnosis not present

## 2020-09-26 DIAGNOSIS — I509 Heart failure, unspecified: Secondary | ICD-10-CM | POA: Diagnosis not present

## 2020-09-26 DIAGNOSIS — J180 Bronchopneumonia, unspecified organism: Secondary | ICD-10-CM | POA: Diagnosis present

## 2020-09-26 DIAGNOSIS — E7800 Pure hypercholesterolemia, unspecified: Secondary | ICD-10-CM | POA: Diagnosis not present

## 2020-09-26 DIAGNOSIS — I700 Atherosclerosis of aorta: Secondary | ICD-10-CM | POA: Diagnosis present

## 2020-09-26 DIAGNOSIS — I5030 Unspecified diastolic (congestive) heart failure: Secondary | ICD-10-CM | POA: Diagnosis not present

## 2020-09-26 DIAGNOSIS — E1169 Type 2 diabetes mellitus with other specified complication: Secondary | ICD-10-CM | POA: Diagnosis present

## 2020-09-26 DIAGNOSIS — H9193 Unspecified hearing loss, bilateral: Secondary | ICD-10-CM | POA: Diagnosis not present

## 2020-09-26 DIAGNOSIS — R0602 Shortness of breath: Secondary | ICD-10-CM | POA: Diagnosis not present

## 2020-09-26 DIAGNOSIS — I2510 Atherosclerotic heart disease of native coronary artery without angina pectoris: Secondary | ICD-10-CM | POA: Diagnosis present

## 2020-09-26 DIAGNOSIS — Z86718 Personal history of other venous thrombosis and embolism: Secondary | ICD-10-CM | POA: Diagnosis not present

## 2020-09-26 DIAGNOSIS — R6510 Systemic inflammatory response syndrome (SIRS) of non-infectious origin without acute organ dysfunction: Secondary | ICD-10-CM | POA: Diagnosis not present

## 2020-09-26 DIAGNOSIS — E1159 Type 2 diabetes mellitus with other circulatory complications: Secondary | ICD-10-CM | POA: Diagnosis not present

## 2020-09-26 DIAGNOSIS — R011 Cardiac murmur, unspecified: Secondary | ICD-10-CM | POA: Diagnosis not present

## 2020-09-26 DIAGNOSIS — M25551 Pain in right hip: Secondary | ICD-10-CM | POA: Diagnosis not present

## 2020-09-26 DIAGNOSIS — I071 Rheumatic tricuspid insufficiency: Secondary | ICD-10-CM | POA: Diagnosis present

## 2020-09-26 DIAGNOSIS — M8000XD Age-related osteoporosis with current pathological fracture, unspecified site, subsequent encounter for fracture with routine healing: Secondary | ICD-10-CM | POA: Diagnosis not present

## 2020-09-26 DIAGNOSIS — E1151 Type 2 diabetes mellitus with diabetic peripheral angiopathy without gangrene: Secondary | ICD-10-CM | POA: Diagnosis present

## 2020-09-26 DIAGNOSIS — N183 Chronic kidney disease, stage 3 unspecified: Secondary | ICD-10-CM | POA: Diagnosis not present

## 2020-09-26 DIAGNOSIS — E1165 Type 2 diabetes mellitus with hyperglycemia: Secondary | ICD-10-CM | POA: Diagnosis present

## 2020-09-26 DIAGNOSIS — E1122 Type 2 diabetes mellitus with diabetic chronic kidney disease: Secondary | ICD-10-CM | POA: Diagnosis not present

## 2020-09-26 DIAGNOSIS — Z20822 Contact with and (suspected) exposure to covid-19: Secondary | ICD-10-CM | POA: Diagnosis present

## 2020-09-26 DIAGNOSIS — I480 Paroxysmal atrial fibrillation: Secondary | ICD-10-CM | POA: Diagnosis not present

## 2020-09-26 DIAGNOSIS — Z66 Do not resuscitate: Secondary | ICD-10-CM | POA: Diagnosis present

## 2020-09-26 DIAGNOSIS — M4800 Spinal stenosis, site unspecified: Secondary | ICD-10-CM | POA: Diagnosis not present

## 2020-09-26 DIAGNOSIS — J9622 Acute and chronic respiratory failure with hypercapnia: Secondary | ICD-10-CM | POA: Diagnosis present

## 2020-09-26 DIAGNOSIS — I152 Hypertension secondary to endocrine disorders: Secondary | ICD-10-CM | POA: Diagnosis not present

## 2020-09-26 DIAGNOSIS — R069 Unspecified abnormalities of breathing: Secondary | ICD-10-CM | POA: Diagnosis not present

## 2020-09-26 DIAGNOSIS — Z9981 Dependence on supplemental oxygen: Secondary | ICD-10-CM | POA: Diagnosis not present

## 2020-09-26 DIAGNOSIS — M519 Unspecified thoracic, thoracolumbar and lumbosacral intervertebral disc disorder: Secondary | ICD-10-CM | POA: Diagnosis not present

## 2020-09-26 DIAGNOSIS — I5031 Acute diastolic (congestive) heart failure: Secondary | ICD-10-CM | POA: Diagnosis not present

## 2020-09-26 DIAGNOSIS — F419 Anxiety disorder, unspecified: Secondary | ICD-10-CM | POA: Diagnosis present

## 2020-09-26 DIAGNOSIS — J9811 Atelectasis: Secondary | ICD-10-CM | POA: Diagnosis present

## 2020-09-26 DIAGNOSIS — S72141D Displaced intertrochanteric fracture of right femur, subsequent encounter for closed fracture with routine healing: Secondary | ICD-10-CM | POA: Diagnosis not present

## 2020-09-26 DIAGNOSIS — J9621 Acute and chronic respiratory failure with hypoxia: Secondary | ICD-10-CM | POA: Diagnosis present

## 2020-09-26 DIAGNOSIS — J90 Pleural effusion, not elsewhere classified: Secondary | ICD-10-CM | POA: Diagnosis not present

## 2020-09-26 DIAGNOSIS — K219 Gastro-esophageal reflux disease without esophagitis: Secondary | ICD-10-CM | POA: Diagnosis present

## 2020-09-26 DIAGNOSIS — E1140 Type 2 diabetes mellitus with diabetic neuropathy, unspecified: Secondary | ICD-10-CM | POA: Diagnosis not present

## 2020-09-26 DIAGNOSIS — I7025 Atherosclerosis of native arteries of other extremities with ulceration: Secondary | ICD-10-CM | POA: Diagnosis not present

## 2020-09-26 DIAGNOSIS — Z85828 Personal history of other malignant neoplasm of skin: Secondary | ICD-10-CM | POA: Diagnosis not present

## 2020-09-26 DIAGNOSIS — F411 Generalized anxiety disorder: Secondary | ICD-10-CM | POA: Diagnosis not present

## 2020-09-26 DIAGNOSIS — I110 Hypertensive heart disease with heart failure: Secondary | ICD-10-CM | POA: Diagnosis present

## 2020-09-26 DIAGNOSIS — F32A Depression, unspecified: Secondary | ICD-10-CM | POA: Diagnosis present

## 2020-09-26 DIAGNOSIS — E872 Acidosis: Secondary | ICD-10-CM | POA: Diagnosis present

## 2020-09-26 DIAGNOSIS — R0689 Other abnormalities of breathing: Secondary | ICD-10-CM | POA: Diagnosis not present

## 2020-09-26 DIAGNOSIS — Z8673 Personal history of transient ischemic attack (TIA), and cerebral infarction without residual deficits: Secondary | ICD-10-CM | POA: Diagnosis not present

## 2020-09-26 DIAGNOSIS — J9611 Chronic respiratory failure with hypoxia: Secondary | ICD-10-CM | POA: Diagnosis not present

## 2020-09-26 DIAGNOSIS — Z7401 Bed confinement status: Secondary | ICD-10-CM | POA: Diagnosis not present

## 2020-09-26 LAB — CULTURE, BLOOD (SINGLE)
Culture: NO GROWTH
Special Requests: ADEQUATE

## 2020-09-26 NOTE — Progress Notes (Signed)
PTAR has arrived to pick up the patient, called the facility and spoke to [employer] to inform her that the patient is on her way to the facility. MD notified.

## 2020-09-26 NOTE — Progress Notes (Signed)
MD has called twice to check on Patient and check if the patient has been pick up yet.

## 2020-09-28 DIAGNOSIS — I480 Paroxysmal atrial fibrillation: Secondary | ICD-10-CM | POA: Diagnosis not present

## 2020-09-28 DIAGNOSIS — M8000XD Age-related osteoporosis with current pathological fracture, unspecified site, subsequent encounter for fracture with routine healing: Secondary | ICD-10-CM | POA: Diagnosis not present

## 2020-09-28 DIAGNOSIS — F411 Generalized anxiety disorder: Secondary | ICD-10-CM | POA: Diagnosis not present

## 2020-09-30 ENCOUNTER — Other Ambulatory Visit: Payer: Self-pay

## 2020-09-30 ENCOUNTER — Encounter: Payer: Self-pay | Admitting: Orthopaedic Surgery

## 2020-09-30 ENCOUNTER — Ambulatory Visit (INDEPENDENT_AMBULATORY_CARE_PROVIDER_SITE_OTHER): Payer: Medicare Other | Admitting: Orthopaedic Surgery

## 2020-09-30 ENCOUNTER — Ambulatory Visit (INDEPENDENT_AMBULATORY_CARE_PROVIDER_SITE_OTHER): Payer: Medicare Other

## 2020-09-30 VITALS — BP 114/66 | HR 114

## 2020-09-30 DIAGNOSIS — M25551 Pain in right hip: Secondary | ICD-10-CM | POA: Diagnosis not present

## 2020-09-30 DIAGNOSIS — I5030 Unspecified diastolic (congestive) heart failure: Secondary | ICD-10-CM | POA: Diagnosis not present

## 2020-09-30 DIAGNOSIS — F411 Generalized anxiety disorder: Secondary | ICD-10-CM | POA: Diagnosis not present

## 2020-09-30 NOTE — Progress Notes (Signed)
Post-Op Visit Note   Patient: [name]           Date of Birth: 04-30-1927           MRN: [id number] Visit Date: 09/30/2020 PCP: Ria Bush, MD   Assessment & Plan:  Chief Complaint:  Chief Complaint  Patient presents with   Right Hip - Pain, Fracture, Follow-up, Routine Post Op   Visit Diagnoses:  1. Pain in right hip     Plan: Patient is a pleasant 87 year old female who comes in today with her grandson.  She is [place] over 3 weeks out right hip IM nail from an intertrochanteric femur fracture, date of surgery 09/07/2020.  She has been doing okay.  She has currently residing at [facility] where she is getting home health therapy.  She is ambulating in a wheelchair.  She has some pain but nothing significant.  Examination of her right hip reveals fully healed surgical wounds with staples intact.  She does have periincisional erythema but no signs of infection or cellulitis.  Calves are soft nontender.  At this point, she will continue with physical therapy weightbearing as tolerated.  I believe the periincisional erythema is likely just irritation from the staples.  These were removed today.  She will follow-up with [place] in 3 weeks time for repeat evaluation and x-rays of the right femur.  Call with concerns or questions in the meantime.  Follow-Up Instructions: Return in about 3 weeks (around 10/21/2020).   Orders:  Orders Placed This Encounter  Procedures   XR HIP UNILAT W OR W/O PELVIS 2-3 VIEWS RIGHT   No orders of the defined types were placed in this encounter.   Imaging: XR HIP UNILAT W OR W/O PELVIS 2-3 VIEWS RIGHT  Result Date: 09/30/2020 Stable alignment of the fracture without hardware complication   PMFS History: Patient Active Problem List   Diagnosis Date Noted   Atrial fibrillation with RVR ([place]) 09/22/2020   Cerebral atrophy ([place]) 09/22/2020   Atherosclerosis of aorta ([place]) 09/22/2020   Osteoporosis 09/22/2020   Bilirubinemia 09/22/2020    Leukocytosis 09/22/2020   Pyuria 09/22/2020   History of recurrent UTIs 09/22/2020   Hearing loss 09/22/2020   Chronic respiratory failure with hypoxia, on home O2 therapy ([place]) 09/22/2020   SIRS (systemic inflammatory response syndrome) (HCC)    Altered mental status, unspecified 09/21/2020   Postoperative anemia due to acute blood loss 09/13/2020   Displaced intertrochanteric fracture of right femur, initial encounter for closed fracture ([place]) 09/05/2020   Hip fracture due to osteoporosis with routine healing 09/05/2020   Financial difficulty 02/26/2020   UTI (urinary tract infection) 10/01/2019   CKD stage 3 due to type 2 diabetes mellitus ([place]) 09/02/2019   History of CVA in adulthood 08/23/2019   Polypharmacy 07/27/2019   E coli bacteremia 02/13/2019   Dyspnea    Chronic leg pain 11/14/2018   Atherosclerosis of native arteries of the extremities with ulceration ([place]) 08/21/2018   Chronic constipation 06/18/2018   Advanced directives, counseling/discussion 01/29/2018   Bilateral hearing loss 01/29/2018   Spinal stenosis of lumbosacral region 51/70/0174   Systolic murmur 94/49/6759   De Quervain's tenosynovitis, left 01/09/2017   Encounter for chronic pain management 02/09/2016   Paroxysmal atrial fibrillation (HCC)    Varicose veins of left lower extremity with inflammation, with ulcer of calf limited to breakdown of skin ([place]) 05/22/2015   Varicose veins of right lower extremity with inflammation 01/23/2015   Diabetes mellitus type 2 with  peripheral artery disease (HCC)    Chronic venous insufficiency    Bleeding from varicose veins of lower extremity, left 02/13/2014   Diabetic polyneuropathy ([place]) 11/19/2013   Right shoulder pain 08/19/2013   Abdominal pain 05/14/2012   Unsteady gait 12/13/2011   Chronic lower back pain 09/14/2011   Hyperlipidemia associated with type 2 diabetes mellitus ([place]) 09/06/2010   GAD (generalized anxiety disorder) 03/15/2010   Obesity, Class I,  BMI 30-34.9 03/15/2010   Hypertension associated with type 2 diabetes mellitus ([place]) 03/15/2010   (HFpEF) heart failure with preserved ejection fraction ([place]) 03/15/2010   Coronary artery disease    Past Medical History:  Diagnosis Date   Actinic keratosis 01/22/2020   right forearm   Acute deep vein thrombosis (DVT) of distal vein of right lower extremity ([place]) 12/2013   Acute thrombus of R proximal gastrocnemius vein. Symptomatic provoked distal DVT   Anxiety    Atherosclerosis of aorta ([place]) 09/22/2020   Basal cell carcinoma 10/30/2018   R nasal bridge, treated with Moh's   Basal cell carcinoma 01/22/2020   left forearm, nodular   Cellulitis of leg, left 01/16/2014   Cerebral atrophy ([place]) 09/22/2020   Head CT 09/21/20 finding   Cerebral infarct ([place])    Head CT 09/21/20: remote LEFT frontoparietal white matter infarct.   Cerumen impaction 04/16/2013   Saw Dr Richardson Landry 02/2018 s/p cleaning   CHF (congestive heart failure) ([place])    Chronic back pain 03/2012   lumbar DDD with herniation - s/p L S1 transforaminal ESI (10/2012 Dr. Sharlet Salina)   Chronic respiratory failure with hypoxia, on home O2 therapy ([place]) 09/22/2020   Chronic venous insufficiency    Coronary artery disease    CABG 1998   Cystocele    Depression    Diabetes mellitus    type 2, followed by endo.   Diastolic dysfunction    Diverticulitis of duodenum    Diverticulosis    Duodenal ulcer perforation ([place]) 02/08/2019   Erythema of breast 04/18/2018   Hearing loss 09/22/2020   Hemorrhoids    Hip fracture ([place]) 09/05/2020   History of heart attack    History of recurrent UTIs 09/22/2020   Hx of echocardiogram    a. Echo 10/13: mild LVH, EF 55-60%, Gr 1 diast dysfn, PASP 32   Hyperlipidemia    Hypertension    Irritable bowel syndrome    Lethargy 07/27/2019   Microcytic anemia    MRSA cellulitis 03/31/2018   Osteoporosis 09/22/2020   PAD (peripheral artery disease) ([place]) 2013   ABI: R 0.91, L 0.83   Peptic  ulcer disease    Pneumonia 02/15/2019   Postmenopausal osteoporosis 01/2019   T score of -2.6   PUD (peptic ulcer disease)    Sphenoid sinusitis 09/21/2020   Head CT 09/21/20: New RIGHT sphenoid sinus fluid/acute sinusitis.   Varicose veins    Venous ulcer of leg ([place]) 12/12/2013   Required prolonged treatment by wound center   Viral gastroenteritis due to [place] virus 05/13/2015   hospitalization    Family History  Problem Relation Age of Onset   Hypertension Mother    Stroke Mother    Diabetes Father    Diabetes Sister    Diabetes Brother    Heart disease Son    Colon cancer Neg Hx     Past Surgical History:  Procedure Laterality Date   ABDOMINAL HYSTERECTOMY  04/04/1973   TAH,BSO   ABI  04/05/2011   R 0.91, L 0.83   ANGIOPLASTY  Right 09/06/2018   Lucky Cowboy, MD (Vasc): PCTA peroneal a; PCTL SFA; PTLA popliteal a; DES stent right SFR and popliteal a   APPENDECTOMY     per pt report   CARDIOVASCULAR STRESS TEST  04/04/2013   low risk myoview ([place])   CATARACT SURGERY     CHOLECYSTECTOMY     COLONOSCOPY  02/02/2010   extensive diverticulosis throughout, small internal hemorrhoids, rec rpt 5 yrs (Dr. Allyn Kenner)   Enigma GRAFT  04/04/1996   dexa scan  09/03/2011   femur -0.8, forearm 0.6 => normal   ENDOVENOUS ABLATION SAPHENOUS VEIN W/ LASER Left 03/04/2014   Kellie Simmering   [facility]  04/04/2012   L S1 transforaminal ESI x2 ([place]) without improvement   FEMORAL ARTERY STENT Right 09/06/2018   Lucky Cowboy, MD (Vasc): PCTA peroneal a; PCTL SFA; PTLA popliteal a; DES stent right SFR and popliteal a   HAND SURGERY     INTRAMEDULLARY (IM) NAIL INTERTROCHANTERIC Right 09/07/2020   Procedure: INTRAMEDULLARY (IM) NAIL INTERTROCHANTRIC;  Surgeon: Leandrew Koyanagi, MD;  Location: [facility];  Service: Orthopedics;  Laterality: Right;   lexiscan myoview  03/05/2011   Small basal inferolateral and anterolateral reversible perfusion defect suggests ischemia.  EF was normal.   old infarct, no new  ischemia   LOWER EXTREMITY ANGIOGRAPHY Right 09/06/2018   Procedure: LOWER EXTREMITY ANGIOGRAPHY;  Surgeon: Algernon Huxley, MD;  Location: [facility];  Service: Cardiovascular;  Laterality: Right;   POPLITEAL ARTERY STENT Right 09/06/2018   Lucky Cowboy, MD (Vasc): PCTA peroneal a; PCTL SFA; PTLA popliteal a; DES stent right SFR and popliteal a   toe surgery     TYMPANOPLASTY  1960s   R side   Vault prolapse A & P repair  04/05/2007   [facility]   Social History   Occupational History   Occupation: Waitress   Tobacco Use   Smoking status: Former    Pack years: 0.00    Types: Cigarettes    Quit date: 03/05/1979    Years since quitting: 41.6   Smokeless tobacco: Never  Vaping Use   Vaping Use: Never used  Substance and Sexual Activity   Alcohol use: No    Alcohol/week: 0.0 standard drinks   Drug use: No   Sexual activity: Not Currently    Comment: 1st intercourse 87yo.--- 2 sexual partners

## 2020-10-01 ENCOUNTER — Inpatient Hospital Stay: Payer: Medicare Other

## 2020-10-01 ENCOUNTER — Emergency Department: Payer: Medicare Other

## 2020-10-01 ENCOUNTER — Inpatient Hospital Stay
Admission: EM | Admit: 2020-10-01 | Discharge: 2020-10-06 | DRG: 291 | Disposition: A | Discharge status: Skilled Nursing Facility | Payer: Medicare Other | Source: Skilled Nursing Facility | Attending: Internal Medicine | Admitting: Internal Medicine

## 2020-10-01 ENCOUNTER — Encounter: Payer: Self-pay | Admitting: Internal Medicine

## 2020-10-01 DIAGNOSIS — R6510 Systemic inflammatory response syndrome (SIRS) of non-infectious origin without acute organ dysfunction: Secondary | ICD-10-CM | POA: Diagnosis not present

## 2020-10-01 DIAGNOSIS — Z7901 Long term (current) use of anticoagulants: Secondary | ICD-10-CM

## 2020-10-01 DIAGNOSIS — I129 Hypertensive chronic kidney disease with stage 1 through stage 4 chronic kidney disease, or unspecified chronic kidney disease: Secondary | ICD-10-CM | POA: Diagnosis not present

## 2020-10-01 DIAGNOSIS — Z743 Need for continuous supervision: Secondary | ICD-10-CM | POA: Diagnosis not present

## 2020-10-01 DIAGNOSIS — I2510 Atherosclerotic heart disease of native coronary artery without angina pectoris: Secondary | ICD-10-CM | POA: Diagnosis present

## 2020-10-01 DIAGNOSIS — R0602 Shortness of breath: Secondary | ICD-10-CM

## 2020-10-01 DIAGNOSIS — I872 Venous insufficiency (chronic) (peripheral): Secondary | ICD-10-CM | POA: Diagnosis not present

## 2020-10-01 DIAGNOSIS — I482 Chronic atrial fibrillation, unspecified: Secondary | ICD-10-CM | POA: Diagnosis present

## 2020-10-01 DIAGNOSIS — Z20822 Contact with and (suspected) exposure to covid-19: Secondary | ICD-10-CM | POA: Diagnosis present

## 2020-10-01 DIAGNOSIS — K219 Gastro-esophageal reflux disease without esophagitis: Secondary | ICD-10-CM | POA: Diagnosis present

## 2020-10-01 DIAGNOSIS — I152 Hypertension secondary to endocrine disorders: Secondary | ICD-10-CM | POA: Diagnosis not present

## 2020-10-01 DIAGNOSIS — E872 Acidosis: Secondary | ICD-10-CM | POA: Diagnosis present

## 2020-10-01 DIAGNOSIS — Z86718 Personal history of other venous thrombosis and embolism: Secondary | ICD-10-CM

## 2020-10-01 DIAGNOSIS — F419 Anxiety disorder, unspecified: Secondary | ICD-10-CM | POA: Diagnosis present

## 2020-10-01 DIAGNOSIS — E785 Hyperlipidemia, unspecified: Secondary | ICD-10-CM

## 2020-10-01 DIAGNOSIS — I517 Cardiomegaly: Secondary | ICD-10-CM | POA: Diagnosis not present

## 2020-10-01 DIAGNOSIS — Z87891 Personal history of nicotine dependence: Secondary | ICD-10-CM | POA: Diagnosis not present

## 2020-10-01 DIAGNOSIS — E1165 Type 2 diabetes mellitus with hyperglycemia: Secondary | ICD-10-CM | POA: Diagnosis present

## 2020-10-01 DIAGNOSIS — N183 Chronic kidney disease, stage 3 unspecified: Secondary | ICD-10-CM | POA: Diagnosis not present

## 2020-10-01 DIAGNOSIS — Z85828 Personal history of other malignant neoplasm of skin: Secondary | ICD-10-CM | POA: Diagnosis not present

## 2020-10-01 DIAGNOSIS — I700 Atherosclerosis of aorta: Secondary | ICD-10-CM | POA: Diagnosis present

## 2020-10-01 DIAGNOSIS — F32A Depression, unspecified: Secondary | ICD-10-CM | POA: Diagnosis present

## 2020-10-01 DIAGNOSIS — R069 Unspecified abnormalities of breathing: Secondary | ICD-10-CM | POA: Diagnosis not present

## 2020-10-01 DIAGNOSIS — E1159 Type 2 diabetes mellitus with other circulatory complications: Secondary | ICD-10-CM

## 2020-10-01 DIAGNOSIS — E1122 Type 2 diabetes mellitus with diabetic chronic kidney disease: Secondary | ICD-10-CM | POA: Diagnosis not present

## 2020-10-01 DIAGNOSIS — I252 Old myocardial infarction: Secondary | ICD-10-CM

## 2020-10-01 DIAGNOSIS — M810 Age-related osteoporosis without current pathological fracture: Secondary | ICD-10-CM | POA: Diagnosis present

## 2020-10-01 DIAGNOSIS — J9622 Acute and chronic respiratory failure with hypercapnia: Secondary | ICD-10-CM | POA: Diagnosis present

## 2020-10-01 DIAGNOSIS — M545 Low back pain, unspecified: Secondary | ICD-10-CM | POA: Diagnosis not present

## 2020-10-01 DIAGNOSIS — Z823 Family history of stroke: Secondary | ICD-10-CM

## 2020-10-01 DIAGNOSIS — M4800 Spinal stenosis, site unspecified: Secondary | ICD-10-CM | POA: Diagnosis not present

## 2020-10-01 DIAGNOSIS — J9601 Acute respiratory failure with hypoxia: Secondary | ICD-10-CM

## 2020-10-01 DIAGNOSIS — J90 Pleural effusion, not elsewhere classified: Secondary | ICD-10-CM | POA: Diagnosis not present

## 2020-10-01 DIAGNOSIS — I110 Hypertensive heart disease with heart failure: Principal | ICD-10-CM | POA: Diagnosis present

## 2020-10-01 DIAGNOSIS — J9621 Acute and chronic respiratory failure with hypoxia: Secondary | ICD-10-CM | POA: Diagnosis present

## 2020-10-01 DIAGNOSIS — I480 Paroxysmal atrial fibrillation: Secondary | ICD-10-CM | POA: Diagnosis not present

## 2020-10-01 DIAGNOSIS — Z66 Do not resuscitate: Secondary | ICD-10-CM | POA: Diagnosis present

## 2020-10-01 DIAGNOSIS — S72141D Displaced intertrochanteric fracture of right femur, subsequent encounter for closed fracture with routine healing: Secondary | ICD-10-CM | POA: Diagnosis not present

## 2020-10-01 DIAGNOSIS — F418 Other specified anxiety disorders: Secondary | ICD-10-CM | POA: Diagnosis not present

## 2020-10-01 DIAGNOSIS — Z9981 Dependence on supplemental oxygen: Secondary | ICD-10-CM | POA: Diagnosis not present

## 2020-10-01 DIAGNOSIS — I5030 Unspecified diastolic (congestive) heart failure: Secondary | ICD-10-CM | POA: Diagnosis not present

## 2020-10-01 DIAGNOSIS — E7800 Pure hypercholesterolemia, unspecified: Secondary | ICD-10-CM | POA: Diagnosis not present

## 2020-10-01 DIAGNOSIS — J180 Bronchopneumonia, unspecified organism: Secondary | ICD-10-CM | POA: Diagnosis present

## 2020-10-01 DIAGNOSIS — R279 Unspecified lack of coordination: Secondary | ICD-10-CM | POA: Diagnosis not present

## 2020-10-01 DIAGNOSIS — Z8249 Family history of ischemic heart disease and other diseases of the circulatory system: Secondary | ICD-10-CM

## 2020-10-01 DIAGNOSIS — I071 Rheumatic tricuspid insufficiency: Secondary | ICD-10-CM | POA: Diagnosis present

## 2020-10-01 DIAGNOSIS — R0902 Hypoxemia: Secondary | ICD-10-CM | POA: Diagnosis not present

## 2020-10-01 DIAGNOSIS — Z8711 Personal history of peptic ulcer disease: Secondary | ICD-10-CM

## 2020-10-01 DIAGNOSIS — Z951 Presence of aortocoronary bypass graft: Secondary | ICD-10-CM

## 2020-10-01 DIAGNOSIS — M519 Unspecified thoracic, thoracolumbar and lumbosacral intervertebral disc disorder: Secondary | ICD-10-CM | POA: Diagnosis not present

## 2020-10-01 DIAGNOSIS — I509 Heart failure, unspecified: Secondary | ICD-10-CM | POA: Diagnosis not present

## 2020-10-01 DIAGNOSIS — I5033 Acute on chronic diastolic (congestive) heart failure: Secondary | ICD-10-CM | POA: Diagnosis present

## 2020-10-01 DIAGNOSIS — J9811 Atelectasis: Secondary | ICD-10-CM | POA: Diagnosis present

## 2020-10-01 DIAGNOSIS — I10 Essential (primary) hypertension: Secondary | ICD-10-CM | POA: Diagnosis not present

## 2020-10-01 DIAGNOSIS — R0689 Other abnormalities of breathing: Secondary | ICD-10-CM | POA: Diagnosis not present

## 2020-10-01 DIAGNOSIS — E1169 Type 2 diabetes mellitus with other specified complication: Secondary | ICD-10-CM | POA: Diagnosis present

## 2020-10-01 DIAGNOSIS — I7025 Atherosclerosis of native arteries of other extremities with ulceration: Secondary | ICD-10-CM | POA: Diagnosis not present

## 2020-10-01 DIAGNOSIS — I5031 Acute diastolic (congestive) heart failure: Secondary | ICD-10-CM | POA: Diagnosis not present

## 2020-10-01 DIAGNOSIS — K5790 Diverticulosis of intestine, part unspecified, without perforation or abscess without bleeding: Secondary | ICD-10-CM | POA: Diagnosis not present

## 2020-10-01 DIAGNOSIS — R011 Cardiac murmur, unspecified: Secondary | ICD-10-CM | POA: Diagnosis not present

## 2020-10-01 DIAGNOSIS — E1140 Type 2 diabetes mellitus with diabetic neuropathy, unspecified: Secondary | ICD-10-CM | POA: Diagnosis not present

## 2020-10-01 DIAGNOSIS — Z79899 Other long term (current) drug therapy: Secondary | ICD-10-CM

## 2020-10-01 DIAGNOSIS — Z8673 Personal history of transient ischemic attack (TIA), and cerebral infarction without residual deficits: Secondary | ICD-10-CM | POA: Diagnosis not present

## 2020-10-01 DIAGNOSIS — I959 Hypotension, unspecified: Secondary | ICD-10-CM | POA: Diagnosis not present

## 2020-10-01 DIAGNOSIS — H9190 Unspecified hearing loss, unspecified ear: Secondary | ICD-10-CM | POA: Diagnosis present

## 2020-10-01 DIAGNOSIS — Z833 Family history of diabetes mellitus: Secondary | ICD-10-CM

## 2020-10-01 DIAGNOSIS — E8729 Other acidosis: Secondary | ICD-10-CM

## 2020-10-01 DIAGNOSIS — I82409 Acute embolism and thrombosis of unspecified deep veins of unspecified lower extremity: Secondary | ICD-10-CM | POA: Diagnosis present

## 2020-10-01 DIAGNOSIS — Z888 Allergy status to other drugs, medicaments and biological substances status: Secondary | ICD-10-CM

## 2020-10-01 DIAGNOSIS — R7989 Other specified abnormal findings of blood chemistry: Secondary | ICD-10-CM | POA: Diagnosis present

## 2020-10-01 DIAGNOSIS — E1151 Type 2 diabetes mellitus with diabetic peripheral angiopathy without gangrene: Secondary | ICD-10-CM

## 2020-10-01 DIAGNOSIS — H9193 Unspecified hearing loss, bilateral: Secondary | ICD-10-CM | POA: Diagnosis not present

## 2020-10-01 DIAGNOSIS — J9611 Chronic respiratory failure with hypoxia: Secondary | ICD-10-CM | POA: Diagnosis not present

## 2020-10-01 LAB — RESP PANEL BY RT-PCR (FLU A&B, COVID) ARPGX2
Influenza A by PCR: NEGATIVE
Influenza B by PCR: NEGATIVE
SARS Coronavirus 2 by RT PCR: NEGATIVE

## 2020-10-01 LAB — CBC WITH DIFFERENTIAL/PLATELET
Abs Immature Granulocytes: 0.13 10*3/uL — ABNORMAL HIGH (ref 0.00–0.07)
Basophils Absolute: 0.1 10*3/uL (ref 0.0–0.1)
Basophils Relative: 1 %
Eosinophils Absolute: 0.4 10*3/uL (ref 0.0–0.5)
Eosinophils Relative: 4 %
HCT: 35.2 % — ABNORMAL LOW (ref 36.0–46.0)
Hemoglobin: 10.6 g/dL — ABNORMAL LOW (ref 12.0–15.0)
Immature Granulocytes: 1 %
Lymphocytes Relative: 14 %
Lymphs Abs: 1.4 10*3/uL (ref 0.7–4.0)
MCH: 28.6 pg (ref 26.0–34.0)
MCHC: 30.1 g/dL (ref 30.0–36.0)
MCV: 94.9 fL (ref 80.0–100.0)
Monocytes Absolute: 0.9 10*3/uL (ref 0.1–1.0)
Monocytes Relative: 9 %
Neutro Abs: 7.5 10*3/uL (ref 1.7–7.7)
Neutrophils Relative %: 71 %
Platelets: 355 10*3/uL (ref 150–400)
RBC: 3.71 MIL/uL — ABNORMAL LOW (ref 3.87–5.11)
RDW: 15.9 % — ABNORMAL HIGH (ref 11.5–15.5)
WBC: 10.4 10*3/uL (ref 4.0–10.5)
nRBC: 0 % (ref 0.0–0.2)

## 2020-10-01 LAB — BASIC METABOLIC PANEL
Anion gap: 7 (ref 5–15)
BUN: 21 mg/dL (ref 8–23)
CO2: 33 mmol/L — ABNORMAL HIGH (ref 22–32)
Calcium: 9.2 mg/dL (ref 8.9–10.3)
Chloride: 98 mmol/L (ref 98–111)
Creatinine, Ser: 0.58 mg/dL (ref 0.44–1.00)
GFR, Estimated: >60 mL/min (ref >60)
Glucose, Bld: 172 mg/dL — ABNORMAL HIGH (ref 70–99)
Potassium: 4.5 mmol/L (ref 3.5–5.1)
Sodium: 138 mmol/L (ref 135–145)

## 2020-10-01 LAB — BLOOD GAS, ARTERIAL
Acid-Base Excess: 5.7 mmol/L — ABNORMAL HIGH (ref 0.0–2.0)
Acid-Base Excess: 13 mmol/L — ABNORMAL HIGH (ref 0.0–2.0)
Allens test (pass/fail): POSITIVE — AB
Bicarbonate: 32.9 mmol/L — ABNORMAL HIGH (ref 20.0–28.0)
Bicarbonate: 39.6 mmol/L — ABNORMAL HIGH (ref 20.0–28.0)
Delivery systems: POSITIVE
Delivery systems: POSITIVE
Expiratory PAP: 6
Expiratory PAP: 6
FIO2: 0.6
FIO2: 0.4
Inspiratory PAP: 12
Inspiratory PAP: 12
O2 Saturation: 97.4 %
O2 Saturation: 94.3 %
Patient temperature: 37
Patient temperature: 37
RATE: 10 resp/min
pCO2 arterial: 61 mmHg — ABNORMAL HIGH (ref 32.0–48.0)
pCO2 arterial: 61 mmHg — ABNORMAL HIGH (ref 32.0–48.0)
pH, Arterial: 7.34 — ABNORMAL LOW (ref 7.350–7.450)
pH, Arterial: 7.42 (ref 7.350–7.450)
pO2, Arterial: 101 mmHg (ref 83.0–108.0)
pO2, Arterial: 71 mmHg — ABNORMAL LOW (ref 83.0–108.0)

## 2020-10-01 LAB — PROCALCITONIN: Procalcitonin: <0.1 ng/mL

## 2020-10-01 LAB — TROPONIN I (HIGH SENSITIVITY): Troponin I (High Sensitivity): 15 ng/L (ref <18)

## 2020-10-01 LAB — CBG MONITORING, ED
Glucose-Capillary: 167 mg/dL — ABNORMAL HIGH (ref 70–99)
Glucose-Capillary: 127 mg/dL — ABNORMAL HIGH (ref 70–99)

## 2020-10-01 LAB — D-DIMER, QUANTITATIVE: D-Dimer, Quant: 2.51 ug/mL-FEU — ABNORMAL HIGH (ref 0.00–0.50)

## 2020-10-01 LAB — GLUCOSE, CAPILLARY
Glucose-Capillary: 114 mg/dL — ABNORMAL HIGH (ref 70–99)
Glucose-Capillary: 233 mg/dL — ABNORMAL HIGH (ref 70–99)

## 2020-10-01 LAB — MAGNESIUM: Magnesium: 1.5 mg/dL — ABNORMAL LOW (ref 1.7–2.4)

## 2020-10-01 LAB — BRAIN NATRIURETIC PEPTIDE: B Natriuretic Peptide: 455.4 pg/mL — ABNORMAL HIGH (ref 0.0–100.0)

## 2020-10-01 MED ORDER — DILTIAZEM HCL ER COATED BEADS 120 MG PO CP24
120.0000 mg | ORAL_CAPSULE | Freq: Every day | ORAL | Status: DC
  Administered 2020-10-01 – 2020-10-04 (×4): 120 mg via ORAL
  Filled 2020-10-01 (×5): qty 1

## 2020-10-01 MED ORDER — VITAMIN E 180 MG (400 UNIT) PO CAPS
400.0000 [IU] | ORAL_CAPSULE | Freq: Every morning | ORAL | Status: DC
  Administered 2020-10-02 – 2020-10-05 (×4): 400 [IU] via ORAL
  Filled 2020-10-01 (×5): qty 1

## 2020-10-01 MED ORDER — PANTOPRAZOLE SODIUM 40 MG PO TBEC
40.0000 mg | DELAYED_RELEASE_TABLET | Freq: Two times a day (BID) | ORAL | Status: DC
  Administered 2020-10-01 – 2020-10-06 (×10): 40 mg via ORAL
  Filled 2020-10-01 (×10): qty 1

## 2020-10-01 MED ORDER — RISAQUAD PO CAPS
1.0000 | ORAL_CAPSULE | Freq: Every day | ORAL | Status: DC
  Administered 2020-10-02 – 2020-10-06 (×5): 1 via ORAL
  Filled 2020-10-01 (×5): qty 1

## 2020-10-01 MED ORDER — FLUTICASONE PROPIONATE 50 MCG/ACT NA SUSP
1.0000 | Freq: Every day | NASAL | Status: DC | PRN
  Filled 2020-10-01: qty 16

## 2020-10-01 MED ORDER — ALBUTEROL SULFATE (2.5 MG/3ML) 0.083% IN NEBU
2.5000 mg | INHALATION_SOLUTION | RESPIRATORY_TRACT | Status: DC | PRN
  Administered 2020-10-02: 2.5 mg via RESPIRATORY_TRACT
  Filled 2020-10-01: qty 3

## 2020-10-01 MED ORDER — DICLOFENAC SODIUM 1 % TD GEL
4.0000 g | Freq: Three times a day (TID) | TRANSDERMAL | Status: DC
  Administered 2020-10-01 – 2020-10-06 (×11): 4 g via TOPICAL
  Filled 2020-10-01 (×2): qty 100

## 2020-10-01 MED ORDER — INSULIN ASPART 100 UNIT/ML IJ SOLN
0.0000 [IU] | Freq: Three times a day (TID) | INTRAMUSCULAR | Status: DC
  Administered 2020-10-01: 1 [IU] via SUBCUTANEOUS
  Administered 2020-10-01 – 2020-10-02 (×2): 2 [IU] via SUBCUTANEOUS
  Administered 2020-10-02: 3 [IU] via SUBCUTANEOUS
  Administered 2020-10-02: 2 [IU] via SUBCUTANEOUS
  Administered 2020-10-03: 5 [IU] via SUBCUTANEOUS
  Administered 2020-10-03 – 2020-10-04 (×2): 2 [IU] via SUBCUTANEOUS
  Administered 2020-10-04: 3 [IU] via SUBCUTANEOUS
  Administered 2020-10-04 – 2020-10-06 (×5): 2 [IU] via SUBCUTANEOUS
  Filled 2020-10-01 (×14): qty 1

## 2020-10-01 MED ORDER — NITROGLYCERIN 0.4 MG SL SUBL: 0.4000 mg | SUBLINGUAL_TABLET | SUBLINGUAL | Status: DC | PRN

## 2020-10-01 MED ORDER — ENSURE ENLIVE PO LIQD
237.0000 mL | Freq: Every morning | ORAL | Status: DC
  Administered 2020-10-02 – 2020-10-06 (×5): 237 mL via ORAL
  Filled 2020-10-01: qty 237

## 2020-10-01 MED ORDER — ALPRAZOLAM 0.25 MG PO TABS
0.2500 mg | ORAL_TABLET | Freq: Three times a day (TID) | ORAL | Status: DC | PRN
  Administered 2020-10-01 – 2020-10-05 (×7): 0.25 mg via ORAL
  Filled 2020-10-01 (×7): qty 1

## 2020-10-01 MED ORDER — PRAVASTATIN SODIUM 20 MG PO TABS
20.0000 mg | ORAL_TABLET | Freq: Every day | ORAL | Status: DC
  Administered 2020-10-01 – 2020-10-05 (×5): 20 mg via ORAL
  Filled 2020-10-01 (×6): qty 1

## 2020-10-01 MED ORDER — BISACODYL 10 MG RE SUPP
10.0000 mg | Freq: Every day | RECTAL | Status: DC | PRN
  Filled 2020-10-01: qty 1

## 2020-10-01 MED ORDER — B COMPLEX-C PO TABS
1.0000 | ORAL_TABLET | Freq: Every morning | ORAL | Status: DC
  Administered 2020-10-02 – 2020-10-06 (×5): 1 via ORAL
  Filled 2020-10-01 (×5): qty 1

## 2020-10-01 MED ORDER — FUROSEMIDE 10 MG/ML IJ SOLN
40.0000 mg | Freq: Two times a day (BID) | INTRAMUSCULAR | Status: DC
  Administered 2020-10-01 – 2020-10-06 (×10): 40 mg via INTRAVENOUS
  Filled 2020-10-01 (×10): qty 4

## 2020-10-01 MED ORDER — LORATADINE 10 MG PO TABS: 10.0000 mg | ORAL_TABLET | Freq: Every day | ORAL | Status: DC | PRN

## 2020-10-01 MED ORDER — FUROSEMIDE 10 MG/ML IJ SOLN
40.0000 mg | Freq: Once | INTRAMUSCULAR | Status: AC
  Administered 2020-10-01: 40 mg via INTRAVENOUS
  Filled 2020-10-01: qty 4

## 2020-10-01 MED ORDER — INSULIN ASPART 100 UNIT/ML IJ SOLN
0.0000 [IU] | Freq: Every day | INTRAMUSCULAR | Status: DC
  Administered 2020-10-01: 2 [IU] via SUBCUTANEOUS
  Filled 2020-10-01: qty 1

## 2020-10-01 MED ORDER — GLUCOSAMINE-CHONDROITIN 250-200 MG PO CAPS: ORAL_CAPSULE | Freq: Every morning | ORAL | Status: DC

## 2020-10-01 MED ORDER — SERTRALINE HCL 50 MG PO TABS
100.0000 mg | ORAL_TABLET | Freq: Every day | ORAL | Status: DC
  Administered 2020-10-02 – 2020-10-06 (×5): 100 mg via ORAL
  Filled 2020-10-01 (×6): qty 2

## 2020-10-01 MED ORDER — OCUVITE-LUTEIN PO CAPS
1.0000 | ORAL_CAPSULE | Freq: Every morning | ORAL | Status: DC
  Administered 2020-10-02 – 2020-10-05 (×4): 1 via ORAL
  Filled 2020-10-01 (×5): qty 1

## 2020-10-01 MED ORDER — OXYCODONE HCL 5 MG PO TABS
2.5000 mg | ORAL_TABLET | Freq: Three times a day (TID) | ORAL | Status: DC | PRN
  Administered 2020-10-03: 2.5 mg via ORAL
  Filled 2020-10-01 (×3): qty 1

## 2020-10-01 MED ORDER — MELATONIN 5 MG PO TABS
5.0000 mg | ORAL_TABLET | Freq: Every day | ORAL | Status: DC
  Administered 2020-10-01 – 2020-10-05 (×5): 5 mg via ORAL
  Filled 2020-10-01 (×5): qty 1

## 2020-10-01 MED ORDER — APIXABAN 5 MG PO TABS
5.0000 mg | ORAL_TABLET | Freq: Two times a day (BID) | ORAL | Status: DC
  Administered 2020-10-01 – 2020-10-06 (×10): 5 mg via ORAL
  Filled 2020-10-01 (×10): qty 1

## 2020-10-01 MED ORDER — METOPROLOL TARTRATE 5 MG/5ML IV SOLN
2.5000 mg | Freq: Once | INTRAVENOUS | Status: AC
  Administered 2020-10-01: 2.5 mg via INTRAVENOUS
  Filled 2020-10-01: qty 5

## 2020-10-01 MED ORDER — POLYETHYLENE GLYCOL 3350 17 G PO PACK
17.0000 g | PACK | Freq: Every day | ORAL | Status: DC
  Administered 2020-10-02 – 2020-10-06 (×4): 17 g via ORAL
  Filled 2020-10-01 (×5): qty 1

## 2020-10-01 MED ORDER — IOHEXOL 350 MG/ML SOLN
75.0000 mL | Freq: Once | INTRAVENOUS | Status: AC | PRN
  Administered 2020-10-01: 75 mL via INTRAVENOUS

## 2020-10-01 MED ORDER — METOPROLOL TARTRATE 50 MG PO TABS
50.0000 mg | ORAL_TABLET | Freq: Two times a day (BID) | ORAL | Status: DC
  Administered 2020-10-01 – 2020-10-06 (×10): 50 mg via ORAL
  Filled 2020-10-01 (×10): qty 1

## 2020-10-01 MED ORDER — ALUM & MAG HYDROXIDE-SIMETH 200-200-20 MG/5ML PO SUSP: 30.0000 mL | ORAL | Status: DC | PRN

## 2020-10-01 MED ORDER — HYDROXYZINE HCL 10 MG PO TABS
10.0000 mg | ORAL_TABLET | Freq: Three times a day (TID) | ORAL | Status: DC | PRN
  Filled 2020-10-01: qty 1

## 2020-10-01 MED ORDER — ACETAMINOPHEN 325 MG PO TABS
650.0000 mg | ORAL_TABLET | Freq: Four times a day (QID) | ORAL | Status: DC | PRN
  Administered 2020-10-01 – 2020-10-06 (×6): 650 mg via ORAL
  Filled 2020-10-01 (×6): qty 2

## 2020-10-01 MED ORDER — DM-GUAIFENESIN ER 30-600 MG PO TB12: 1.0000 | ORAL_TABLET | Freq: Two times a day (BID) | ORAL | Status: DC | PRN

## 2020-10-01 MED ORDER — HYDRALAZINE HCL 20 MG/ML IJ SOLN: 5.0000 mg | INTRAMUSCULAR | Status: DC | PRN

## 2020-10-01 NOTE — ED Notes (Signed)
Respiratory called for ABG

## 2020-10-01 NOTE — H&P (Signed)
History and Physical    [name] GBT:[id number] DOB: May 05, 1927 DOA: 10/01/2020  Referring MD/NP/PA:   PCP: Ria Bush, MD   Patient coming from:  The patient is coming from SNF.  At baseline, pt is dependent for most of ADL.        Chief Complaint: SOB  HPI: [name] is a 87 y.o. female with medical history significant of dCHF, on 2 L oxygen at home, hypertension, hyperlipidemia, diabetes mellitus, stroke, GERD, depression, anxiety, atrial fibrillation and DVT on Eliquis, varicose vein, peptic ulcer disease, duodenal ulcer with perforation, PAD, anemia, UTI, diverticulitis, CAD, CABG, CKD stage III, who presents with shortness of breath.  Per her daughter-in-law at the bedside, patient started having worsening shortness of breath since last night, which has been progressively worsening.  Patient has mild cough, no chest pain, fever or chills.  Patient was found to have severe respiratory distress, with oxygen desaturation to 78% on home 2 L oxygen, BiPAP started in the ED.  Patient does not have nausea ,vomiting, abdominal pain, but reports indigestion.  No symptoms of UTI.  ED Course: pt was found to have BNP 455, troponin level 15, negative COVID PCR, positive D-dimer 2.51, temperature normal, blood pressure 133/74, heart rate 114, 88, RR 24, 15, chest x-ray showed cardiomegaly, CABG, bilateral interstitial edema with small left pleural effusion.  ABG with pH 7.34, CO2 61, O2 101.  Patient is admitted to progressive bed as inpatient.   CTA of chest: 1. Suboptimal pulmonary artery contrast timing today but no pulmonary embolus identified.   2. Increased bilateral pleural effusions since June 11th with more atelectasis but new right upper and lower lobe confluent peribronchial opacity.  Favor right lung Multilobar Bronchopneumonia over asymmetric pulmonary edema.   3. Stable cardiomegaly. Prior CABG. Aortic Atherosclerosis (ICD10-I70.0).    Review of  Systems:   General: no fevers, chills, no body weight gain, has fatigue HEENT: no blurry vision, hearing changes or sore throat Respiratory: has dyspnea, coughing, no wheezing CV: no chest pain, no palpitations GI: no nausea, vomiting, abdominal pain, diarrhea, constipation GU: no dysuria, burning on urination, increased urinary frequency, hematuria  Ext: has mild leg edema Neuro: no unilateral weakness, numbness, or tingling, no vision change or hearing loss Skin: no rash, no skin tear. MSK: No muscle spasm, no deformity, no limitation of range of movement in spin Heme: No easy bruising.  Travel history: No recent long distant travel.  Allergy:  Allergies  Allergen Reactions   Metformin And Related Diarrhea    Past Medical History:  Diagnosis Date   Actinic keratosis 01/22/2020   right forearm   Acute deep vein thrombosis (DVT) of distal vein of right lower extremity ([place]) 12/2013   Acute thrombus of R proximal gastrocnemius vein. Symptomatic provoked distal DVT   Anxiety    Atherosclerosis of aorta ([place]) 09/22/2020   Basal cell carcinoma 10/30/2018   R nasal bridge, treated with Moh's   Basal cell carcinoma 01/22/2020   left forearm, nodular   Cellulitis of leg, left 01/16/2014   Cerebral atrophy ([place]) 09/22/2020   Head CT 09/21/20 finding   Cerebral infarct ([place])    Head CT 09/21/20: remote LEFT frontoparietal white matter infarct.   Cerumen impaction 04/16/2013   Saw Dr Richardson Landry 02/2018 s/p cleaning   CHF (congestive heart failure) ([place])    Chronic back pain 03/2012   lumbar DDD with herniation - s/p L S1 transforaminal ESI (10/2012 Dr. Sharlet Salina)   Chronic respiratory failure  with hypoxia, on home O2 therapy ([place]) 09/22/2020   Chronic venous insufficiency    Coronary artery disease    CABG 1998   Cystocele    Depression    Diabetes mellitus    type 2, followed by endo.   Diastolic dysfunction    Diverticulitis of duodenum    Diverticulosis    Duodenal ulcer  perforation ([place]) 02/08/2019   Erythema of breast 04/18/2018   Hearing loss 09/22/2020   Hemorrhoids    Hip fracture ([place]) 09/05/2020   History of heart attack    History of recurrent UTIs 09/22/2020   Hx of echocardiogram    a. Echo 10/13: mild LVH, EF 55-60%, Gr 1 diast dysfn, PASP 32   Hyperlipidemia    Hypertension    Irritable bowel syndrome    Lethargy 07/27/2019   Microcytic anemia    MRSA cellulitis 03/31/2018   Osteoporosis 09/22/2020   PAD (peripheral artery disease) ([place]) 2013   ABI: R 0.91, L 0.83   Peptic ulcer disease    Pneumonia 02/15/2019   Postmenopausal osteoporosis 01/2019   T score of -2.6   PUD (peptic ulcer disease)    Sphenoid sinusitis 09/21/2020   Head CT 09/21/20: New RIGHT sphenoid sinus fluid/acute sinusitis.   Varicose veins    Venous ulcer of leg ([place]) 12/12/2013   Required prolonged treatment by wound center   Viral gastroenteritis due to [place] virus 05/13/2015   hospitalization    Past Surgical History:  Procedure Laterality Date   ABDOMINAL HYSTERECTOMY  04/04/1973   TAH,BSO   ABI  04/05/2011   R 0.91, L 0.83   ANGIOPLASTY Right 09/06/2018   Lucky Cowboy, MD (Vasc): PCTA peroneal a; PCTL SFA; PTLA popliteal a; DES stent right SFR and popliteal a   APPENDECTOMY     per pt report   CARDIOVASCULAR STRESS TEST  04/04/2013   low risk myoview (Nahser)   CATARACT SURGERY     CHOLECYSTECTOMY     COLONOSCOPY  02/02/2010   extensive diverticulosis throughout, small internal hemorrhoids, rec rpt 5 yrs (Dr. Allyn Kenner)   [place] GRAFT  04/04/1996   dexa scan  09/03/2011   femur -0.8, forearm 0.6 => normal   ENDOVENOUS ABLATION SAPHENOUS VEIN W/ LASER Left 03/04/2014   Kellie Simmering   [facility]  04/04/2012   L S1 transforaminal ESI x2 ([place]) without improvement   FEMORAL ARTERY STENT Right 09/06/2018   Lucky Cowboy, MD (Vasc): PCTA peroneal a; PCTL SFA; PTLA popliteal a; DES stent right SFR and popliteal a   HAND SURGERY     INTRAMEDULLARY (IM) NAIL  INTERTROCHANTERIC Right 09/07/2020   Procedure: INTRAMEDULLARY (IM) NAIL INTERTROCHANTRIC;  Surgeon: Leandrew Koyanagi, MD;  Location: [place];  Service: Orthopedics;  Laterality: Right;   lexiscan myoview  03/05/2011   Small basal inferolateral and anterolateral reversible perfusion defect suggests ischemia.  EF was normal.   old infarct, no new ischemia   LOWER EXTREMITY ANGIOGRAPHY Right 09/06/2018   Procedure: LOWER EXTREMITY ANGIOGRAPHY;  Surgeon: Algernon Huxley, MD;  Location: [facility];  Service: Cardiovascular;  Laterality: Right;   POPLITEAL ARTERY STENT Right 09/06/2018   Lucky Cowboy, MD (Vasc): PCTA peroneal a; PCTL SFA; PTLA popliteal a; DES stent right SFR and popliteal a   toe surgery     TYMPANOPLASTY  1960s   R side   Vault prolapse A & P repair  04/05/2007   Dr Rhodia Albright [facility]    Social History:  reports that she quit smoking about  41 years ago. Her smoking use included cigarettes. She has never used smokeless tobacco. She reports that she does not drink alcohol and does not use drugs.  Family History:  Family History  Problem Relation Age of Onset   Hypertension Mother    Stroke Mother    Diabetes Father    Diabetes Sister    Diabetes Brother    Heart disease Son    Colon cancer Neg Hx      Prior to Admission medications   Medication Sig Start Date End Date Taking? Authorizing Provider  acetaminophen (TYLENOL) 325 MG tablet Take 2 tablets (650 mg total) by mouth every 6 (six) hours. 09/25/20   Maness, Arnette Norris, MD  ALPRAZolam Duanne Moron) 1 MG tablet Take 0.5 tablets (0.5 mg total) by mouth at bedtime as needed for anxiety. 09/14/20   Maness, Arnette Norris, MD  alum & mag hydroxide-simeth (MAALOX PLUS) 400-400-40 MG/5ML suspension Take 30 mLs by mouth every 4 (four) hours as needed for indigestion. Almacone-2    [provider]  B Complex-C (B-COMPLEX WITH VITAMIN C) tablet Take 1 tablet by mouth every morning.    [provider]  bisacodyl (DULCOLAX) 10 MG  suppository Place 10 mg rectally daily as needed (constipation not relieved by MOM).    [provider]  diclofenac sodium (VOLTAREN) 1 % GEL Apply 1 application topically 3 (three) times daily. 01/09/17   Ria Bush, MD  DILT-XR 120 MG 24 hr capsule TAKE 1 CAPSULE BY MOUTH EVERY DAY 04/09/20   Ria Bush, MD  ELIQUIS 5 MG TABS tablet TAKE 1 TABLET BY MOUTH TWICE A DAY 04/09/20   Nahser, Wonda Cheng, MD  Ensure (ENSURE) Take 237 mLs by mouth every morning.    [provider]  famotidine (PEPCID) 20 MG tablet Take 1 tablet (20 mg total) by mouth daily as needed for heartburn (breakthrough heartburn). 07/30/19   Ria Bush, MD  fluticasone [facility]) 50 MCG/ACT nasal spray Place 1 spray into both nostrils daily as needed for allergies. 09/23/19   [provider]  furosemide (LASIX) 20 MG tablet Take 1 tablet (20 mg total) by mouth daily. 09/26/20   Maness, Arnette Norris, MD  GLUCOSAMINE-CHONDROITIN PO Take 1 tablet by mouth every morning.    [provider]  Lancets [facility] DELICA PLUS [id number]) MISC Use to test blood sugar once daily dx code E11.9 04/08/20   Elayne Snare, MD  loratadine (CLARITIN) 10 MG tablet Take 10 mg by mouth daily as needed for allergies.    [provider]  melatonin 3 MG TABS tablet Take 1 tablet (3 mg total) by mouth at bedtime. 09/25/20   Maness, Arnette Norris, MD  metoprolol tartrate (LOPRESSOR) 50 MG tablet TAKE 1 TABLET BY MOUTH TWICE A DAY 04/09/20   Ria Bush, MD  Multiple Vitamins-Minerals (ICAPS) CAPS Take 1 capsule by mouth every morning.    [provider]  nitroGLYCERIN (NITROSTAT) 0.4 MG SL tablet Place 1 tablet (0.4 mg total) under the tongue every 5 (five) minutes as needed for chest pain. 02/02/17   Nahser, Wonda Cheng, MD  ondansetron (ZOFRAN) 4 MG tablet Take 1 tablet (4 mg total) by mouth every 6 (six) hours as needed for nausea. 09/11/20   Maness, Arnette Norris, MD  [facility] VERIO test strip USE TO TEST BLOOD SUGAR  ONCE DAILY. DX:E11.9 04/09/20   Elayne Snare, MD  oxyCODONE (OXY IR/ROXICODONE) 5 MG immediate release tablet Take 0.5 tablets (2.5 mg total) by mouth every 8 (eight) hours as needed for breakthrough pain.  09/25/20   Maness, Arnette Norris, MD  OXYGEN Inhale 2 L into the lungs continuous.    [provider]  pantoprazole (PROTONIX) 40 MG tablet TAKE 1 TABLET BY MOUTH TWICE A DAY 04/09/20   Ria Bush, MD  pioglitazone (ACTOS) 15 MG tablet TAKE 1 TABLET BY MOUTH EVERY DAY 04/09/20   Ria Bush, MD  polyethylene glycol powder (GLYCOLAX/MIRALAX) 17 GM/SCOOP powder Dissolve 1 capful (17 g) in water and drink daily. 09/26/20   Maness, Arnette Norris, MD  potassium chloride (MICRO-K) 10 MEQ CR capsule TAKE ONE CAPSULE BY MOUTH ONCE DAILY 08/21/20   Ria Bush, MD  pravastatin (PRAVACHOL) 20 MG tablet Take 1 tablet (20 mg total) by mouth daily. 05/27/20   Ria Bush, MD  Probiotic Product (ALIGN) 4 MG CAPS Take 1 capsule (4 mg total) by mouth daily. 08/15/17   Ria Bush, MD  sertraline (ZOLOFT) 100 MG tablet Take 1 tablet (100 mg total) by mouth daily. 09/26/20   Maness, Arnette Norris, MD  sitaGLIPtin (JANUVIA) 50 MG tablet Take 50 mg by mouth every morning.    [provider]  Sodium Phosphates (RA SALINE ENEMA RE) Place 1 enema rectally daily as needed (constipation not relieved by Bisacodyl suppository).    [provider]  Vitamin E 450 MG (1000 UT) CAPS Take 450 mg by mouth every morning.    [provider]    Physical Exam: Vitals:   10/01/20 0700 10/01/20 0730 10/01/20 0800 10/01/20 0801  BP: (!) 144/84 123/77 137/76   Pulse:  71    Resp: (!) 26 16 15    Temp:    97.8 F (36.6 C)  TempSrc:    Axillary  SpO2: 100% 94% 94%   Weight:      Height:       General: Not in acute distress HEENT:       Eyes: PERRL, EOMI, no scleral icterus.       ENT: No discharge from the ears and nose, no pharynx injection, no tonsillar enlargement.        Neck: positive  JVD, no bruit, no mass felt. Heme: No neck lymph node enlargement. Cardiac: S1/S2, RRR, No gallops or rubs. Respiratory: has fine crackles bilaterally GI: Soft, nondistended, nontender, no rebound pain, no organomegaly, BS present. GU: No hematuria Ext: has trace leg edema bilaterally. 1+DP/PT pulse bilaterally. Musculoskeletal: No joint deformities, No joint redness or warmth, no limitation of ROM in spin. Skin: No rashes.  Neuro: Alert, cranial nerves II-XII grossly intact, moves all extremities normally.  Psych: Patient is not psychotic, no suicidal or hemocidal ideation.  Labs on Admission: I have personally reviewed following labs and imaging studies  CBC: Recent Labs  Lab 10/01/20 0512  WBC 10.4  NEUTROABS 7.5  HGB 10.6*  HCT 35.2*  MCV 94.9  PLT 751   Basic Metabolic Panel: Recent Labs  Lab 10/01/20 0512  NA 138  K 4.5  CL 98  CO2 33*  GLUCOSE 172*  BUN 21  CREATININE 0.58  CALCIUM 9.2  MG 1.5*   GFR: Estimated Creatinine Clearance: 31.7 mL/min (by C-G formula based on SCr of 0.58 mg/dL). Liver Function Tests: No results for input(s): AST, ALT, ALKPHOS, BILITOT, PROT, ALBUMIN in the last 168 hours. No results for input(s): LIPASE, AMYLASE in the last 168 hours. No results for input(s): AMMONIA in the last 168 hours. Coagulation Profile: No results for input(s): INR, PROTIME in the last 168 hours. Cardiac Enzymes: No results for input(s): CKTOTAL, CKMB, CKMBINDEX, TROPONINI in the  last 168 hours. BNP (last 3 results) No results for input(s): PROBNP in the last 8760 hours. HbA1C: No results for input(s): HGBA1C in the last 72 hours. CBG: Recent Labs  Lab 09/24/20 2145 09/25/20 0620 09/25/20 1229 09/25/20 1701 10/01/20 0759  GLUCAP 159* 135* 196* 128* 167*   Lipid Profile: No results for input(s): CHOL, HDL, LDLCALC, TRIG, CHOLHDL, LDLDIRECT in the last 72 hours. Thyroid Function Tests: No results for input(s): TSH, T4TOTAL, [name]4, T3FREE,  THYROIDAB in the last 72 hours. Anemia Panel: No results for input(s): VITAMINB12, FOLATE, FERRITIN, TIBC, IRON, RETICCTPCT in the last 72 hours. Urine analysis:    Component Value Date/Time   COLORURINE YELLOW 09/21/2020 1500   APPEARANCEUR HAZY (A) 09/21/2020 1500   LABSPEC 1.011 09/21/2020 1500   PHURINE 6.0 09/21/2020 1500   GLUCOSEU NEGATIVE 09/21/2020 1500   GLUCOSEU NEGATIVE 02/19/2020 1332   HGBUR NEGATIVE 09/21/2020 1500   BILIRUBINUR NEGATIVE 09/21/2020 1500   BILIRUBINUR negative 06/10/2020 1613   KETONESUR NEGATIVE 09/21/2020 1500   PROTEINUR NEGATIVE 09/21/2020 1500   UROBILINOGEN 0.2 06/10/2020 1613   UROBILINOGEN 0.2 02/19/2020 1332   NITRITE NEGATIVE 09/21/2020 1500   LEUKOCYTESUR MODERATE (A) 09/21/2020 1500   Sepsis Labs: @LABRCNTIP (procalcitonin:4,lacticidven:4) ) Recent Results (from the past 240 hour(s))  Blood culture (routine single)     Status: None   Collection Time: 09/21/20 12:07 PM   Specimen: BLOOD RIGHT ARM  Result Value Ref Range Status   Specimen Description BLOOD RIGHT ARM  Final   Special Requests   Final    BOTTLES DRAWN AEROBIC AND ANAEROBIC Blood Culture adequate volume   Culture   Final    NO GROWTH 5 DAYS Performed at [facility], [place] [address], [place], [place] [address]    Report Status 09/26/2020 FINAL  Final  Urine culture     Status: Abnormal   Collection Time: 09/21/20 12:07 PM   Specimen: In/Out Cath Urine  Result Value Ref Range Status   Specimen Description IN/OUT CATH URINE  Final   Special Requests   Final    NONE Performed at [facility], [place] [address] [place], [place] [address]    Culture (A)  Final    >=100,000 COLONIES/mL MULTIPLE SPECIES PRESENT, SUGGEST RECOLLECTION   Report Status 09/23/2020 FINAL  Final  Resp Panel by RT-PCR (Flu A&B, Covid) Nasopharyngeal Swab     Status: None   Collection Time: 09/22/20  5:31 AM   Specimen: Nasopharyngeal Swab; Nasopharyngeal(NP) swabs in vial transport  medium  Result Value Ref Range Status   SARS Coronavirus 2 by RT PCR NEGATIVE NEGATIVE Final    Comment: (NOTE) SARS-CoV-2 target nucleic acids are NOT DETECTED.  The SARS-CoV-2 RNA is generally detectable in upper respiratory specimens during the acute phase of infection. The lowest concentration of SARS-CoV-2 viral copies this assay can detect is 138 copies/mL. A negative result does not preclude SARS-Cov-2 infection and should not be used as the sole basis for treatment or other patient management decisions. A negative result may occur with  improper specimen collection/handling, submission of specimen other than nasopharyngeal swab, presence of viral mutation(s) within the areas targeted by this assay, and inadequate number of viral copies(<138 copies/mL). A negative result must be combined with clinical observations, patient history, and epidemiological information. The expected result is Negative.  Fact Sheet for Patients:  EntrepreneurPulse.com.au  Fact Sheet for Healthcare Providers:  IncredibleEmployment.be  This test is no t yet approved or cleared by the [place] FDA  and  has been authorized for detection and/or diagnosis of SARS-CoV-2 by FDA under an Emergency Use Authorization (EUA). This EUA will remain  in effect (meaning this test can be used) for the duration of the COVID-19 declaration under Section 564(b)(1) of the Act, 21 U.S.C.section 360bbb-3(b)(1), unless the authorization is terminated  or revoked sooner.       Influenza A by PCR NEGATIVE NEGATIVE Final   Influenza B by PCR NEGATIVE NEGATIVE Final    Comment: (NOTE) The Xpert Xpress SARS-CoV-2/FLU/RSV plus assay is intended as an aid in the diagnosis of influenza from Nasopharyngeal swab specimens and should not be used as a sole basis for treatment. Nasal washings and aspirates are unacceptable for Xpert Xpress SARS-CoV-2/FLU/RSV testing.  Fact Sheet for  Patients: EntrepreneurPulse.com.au  Fact Sheet for Healthcare Providers: IncredibleEmployment.be  This test is not yet approved or cleared by the [place] FDA and has been authorized for detection and/or diagnosis of SARS-CoV-2 by FDA under an Emergency Use Authorization (EUA). This EUA will remain in effect (meaning this test can be used) for the duration of the COVID-19 declaration under Section 564(b)(1) of the Act, 21 U.S.C. section 360bbb-3(b)(1), unless the authorization is terminated or revoked.  Performed at [facility], [place] [address] [place], [place] [address]   SARS CORONAVIRUS 2 (TAT 6-24 HRS) Nasopharyngeal Nasopharyngeal Swab     Status: None   Collection Time: 09/24/20  2:25 PM   Specimen: Nasopharyngeal Swab  Result Value Ref Range Status   SARS Coronavirus 2 NEGATIVE NEGATIVE Final    Comment: (NOTE) SARS-CoV-2 target nucleic acids are NOT DETECTED.  The SARS-CoV-2 RNA is generally detectable in upper and lower respiratory specimens during the acute phase of infection. Negative results do not preclude SARS-CoV-2 infection, do not rule out co-infections with other pathogens, and should not be used as the sole basis for treatment or other patient management decisions. Negative results must be combined with clinical observations, patient history, and epidemiological information. The expected result is Negative.  Fact Sheet for Patients: SugarRoll.be  Fact Sheet for Healthcare Providers: https://www.woods-mathews.com/  This test is not yet approved or cleared by the [place] FDA and  has been authorized for detection and/or diagnosis of SARS-CoV-2 by FDA under an Emergency Use Authorization (EUA). This EUA will remain  in effect (meaning this test can be used) for the duration of the COVID-19 declaration under Se ction 564(b)(1) of the Act, 21 U.S.C. section  360bbb-3(b)(1), unless the authorization is terminated or revoked sooner.  Performed at [facility], [place] [address] [place], [place] [address]   Resp Panel by RT-PCR (Flu A&B, Covid) Nasopharyngeal Swab     Status: None   Collection Time: 10/01/20  5:12 AM   Specimen: Nasopharyngeal Swab; Nasopharyngeal(NP) swabs in vial transport medium  Result Value Ref Range Status   SARS Coronavirus 2 by RT PCR NEGATIVE NEGATIVE Final    Comment: (NOTE) SARS-CoV-2 target nucleic acids are NOT DETECTED.  The SARS-CoV-2 RNA is generally detectable in upper respiratory specimens during the acute phase of infection. The lowest concentration of SARS-CoV-2 viral copies this assay can detect is 138 copies/mL. A negative result does not preclude SARS-Cov-2 infection and should not be used as the sole basis for treatment or other patient management decisions. A negative result may occur with  improper specimen collection/handling, submission of specimen other than nasopharyngeal swab, presence of viral mutation(s) within the areas targeted by this assay, and inadequate number of viral copies(<138  copies/mL). A negative result must be combined with clinical observations, patient history, and epidemiological information. The expected result is Negative.  Fact Sheet for Patients:  EntrepreneurPulse.com.au  Fact Sheet for Healthcare Providers:  IncredibleEmployment.be  This test is no t yet approved or cleared by the [place] FDA and  has been authorized for detection and/or diagnosis of SARS-CoV-2 by FDA under an Emergency Use Authorization (EUA). This EUA will remain  in effect (meaning this test can be used) for the duration of the COVID-19 declaration under Section 564(b)(1) of the Act, 21 U.S.C.section 360bbb-3(b)(1), unless the authorization is terminated  or revoked sooner.       Influenza A by PCR NEGATIVE NEGATIVE Final   Influenza B by PCR  NEGATIVE NEGATIVE Final    Comment: (NOTE) The Xpert Xpress SARS-CoV-2/FLU/RSV plus assay is intended as an aid in the diagnosis of influenza from Nasopharyngeal swab specimens and should not be used as a sole basis for treatment. Nasal washings and aspirates are unacceptable for Xpert Xpress SARS-CoV-2/FLU/RSV testing.  Fact Sheet for Patients: EntrepreneurPulse.com.au  Fact Sheet for Healthcare Providers: IncredibleEmployment.be  This test is not yet approved or cleared by the [place] FDA and has been authorized for detection and/or diagnosis of SARS-CoV-2 by FDA under an Emergency Use Authorization (EUA). This EUA will remain in effect (meaning this test can be used) for the duration of the COVID-19 declaration under Section 564(b)(1) of the Act, 21 U.S.C. section 360bbb-3(b)(1), unless the authorization is terminated or revoked.  Performed at [facility], [address], [place], [place] [address]      Radiological Exams on Admission: CT Angio Chest PE W and/or Wo Contrast  Result Date: 10/01/2020 CLINICAL DATA:  87 year old female with shortness of breath since 2000 hours. Hypoxic. EXAM: CT ANGIOGRAPHY CHEST WITH CONTRAST TECHNIQUE: Multidetector CT imaging of the chest was performed using the standard protocol during bolus administration of intravenous contrast. Multiplanar CT image reconstructions and MIPs were obtained to evaluate the vascular anatomy. CONTRAST:  88mL OMNIPAQUE IOHEXOL 350 MG/ML SOLN COMPARISON:  CTA chest 09/12/2020, and earlier. FINDINGS: Cardiovascular: Suboptimal but adequate contrast bolus timing in the pulmonary arterial tree. Mild respiratory motion. Compared to the recent CTA on 09/12/2020, There is no convincing filling defect identified in the pulmonary arteries to suggest acute pulmonary embolism. Prior CABG. Stable cardiomegaly. No pericardial effusion. Mild contrast in the thoracic aorta today.  Mediastinum/Nodes: Mild reactive appearing mediastinal lymph nodes, slightly increased from earlier in the month. Lungs/Pleura: Increased layering bilateral pleural effusions, now small to moderate with simple fluid density and mild loculation on the left. Major airways remain patent. There is increased dependent enhancing atelectasis, but also moderate new asymmetric peribronchial opacities in both the right upper and lower lobes. Jungman to no pulmonary septal thickening. Upper Abdomen: Negative visible liver, spleen, stomach, adrenal glands and left renal upper pole. Musculoskeletal: Prior sternotomy. Osteopenia. Stable visualized osseous structures. Review of the MIP images confirms the above findings. IMPRESSION: 1. Suboptimal pulmonary artery contrast timing today but no pulmonary embolus identified. 2. Increased bilateral pleural effusions since June 11th with more atelectasis but new right upper and lower lobe confluent peribronchial opacity. Favor right lung Multilobar Bronchopneumonia over asymmetric pulmonary edema. 3. Stable cardiomegaly. Prior CABG. Aortic Atherosclerosis (ICD10-I70.0). Electronically Signed   By: Genevie Ann M.D.   On: 10/01/2020 07:25   DG Chest Portable 1 View  Result Date: 10/01/2020 CLINICAL DATA:  Shortness of breath.  Hypoxia. EXAM: PORTABLE CHEST 1 VIEW COMPARISON:  09/21/2020.  FINDINGS: Prior CABG. Cardiomegaly. Bilateral pulmonary infiltrates/edema. Small left pleural effusion. No pneumothorax. IMPRESSION: Prior CABG. Cardiomegaly. Bilateral pulmonary infiltrates/edema. Small left pleural effusion. Findings suggest congestive heart failure. Bilateral pneumonia cannot be excluded. Electronically Signed   By: Marcello Moores  Register   On: 10/01/2020 05:52   XR HIP UNILAT W OR W/O PELVIS 2-3 VIEWS RIGHT  Result Date: 09/30/2020 Stable alignment of the fracture without hardware complication    EKG: I have personally reviewed.  Atrial fibrillation, QTC 535, low voltage, ST  depression in V4-V6 and in lateral leads.  Poor quality of EKG strips.  Assessment/Plan Principal Problem:   Acute on chronic diastolic CHF (congestive heart failure) (HCC) Active Problems:   Coronary artery disease   Hypertension associated with type 2 diabetes mellitus ([place])   Hyperlipidemia associated with type 2 diabetes mellitus (HCC)   Diabetes mellitus type 2 with peripheral artery disease (HCC)   Acute on chronic respiratory failure with hypoxia and hypercapnia (HCC)   History of CVA in adulthood   Atrial fibrillation, chronic (HCC)   Depression with anxiety   DVT (deep venous thrombosis) (HCC)   Acute on chronic respiratory failure with hypoxia and hypercapnia due to acute on chronic diastolic heart failure: Patient has elevated BNP 455, chest x-ray showed bilateral interstitial edema, consistent with CHF exacerbation.  Patient has positive D-dimer, but CT angiogram negative for PE.  CT angiogram showed new right upper and lower lobe confluent peribronchial opacity, but patient does not have fever or leukocytosis.  Clinically does not seem to have pneumonia.  Will get procalcitonin level.  -Will admit to progressive unit as inpatient -Lasix 40 mg bid by IV -2d echo -Daily weights -strict I/O's -Low salt diet -Fluid restriction -Obtain REDs Vest reading -f/u procalcitonin level  Coronary artery disease: no CP -Pravastatin  Hypertension associated with type 2 diabetes mellitus (HCC) -IV hydralazine as needed -Patient is on IV Lasix -Continue home: Dilt-XR, metoprolol  Hyperlipidemia associated with type 2 diabetes mellitus ([place]) -Pravastatin  Diabetes mellitus type 2 with peripheral artery disease ([place]): Recent A1c 7.3, poorly controlled.  Patient still taking Actos and Januvia -SSI  History of CVA in adulthood -Pravastatin -Patient is on Eliquis for A. fib  Atrial fibrillation, chronic ([place]): -Metoprolol and Dilt-XR -Eliquis  Depression with  anxiety -continue home meds  DVT (deep venous thrombosis) (HCC) -Eliquis           DVT ppx: on Eliquis Code Status: DNR (pt has DNR form from SNF) Family Communication:   Yes, patient's daughter-in-law at bed side Disposition Plan:  Anticipate discharge back to previous environment Consults called:  none Admission status and Level of care: Progressive Cardiac:     as inpt       Status is: Inpatient  Remains inpatient appropriate because:Inpatient level of care appropriate due to severity of illness  Dispo: The patient is from: SNF              Anticipated d/c is to: SNF              Patient currently is not medically stable to d/c.   Difficult to place patient No          Date of Service 10/01/2020    [facility]   If 7PM-7AM, please contact night-coverage [email or web address] 10/01/2020, 9:54 AM

## 2020-10-01 NOTE — ED Triage Notes (Signed)
BIBA from [facility] with a complaint of SOB since 2000 last night.  EMS reports that the patient was on 2L [place] at the facility and was satting in the 80s upon their arrival.  She arrived on NRB with 98%.  Patient denies pain.  Unknown if patient uses oxygen chronically.

## 2020-10-01 NOTE — ED Notes (Signed)
Pt cleansed of urine, brief changed, purewick changed, and pt turned to L side (supported by pillow).

## 2020-10-01 NOTE — ED Provider Notes (Signed)
[facility] Emergency Department Provider Note  ____________________________________________   Event Date/Time   First MD Initiated Contact with Patient 10/01/20 0502     (approximate)  I have reviewed the triage vital signs and the nursing notes.   HISTORY  Chief Complaint Shortness of Breath    HPI [name] is a 87 y.o. female with history of atrial fibrillation on Eliquis, hypertension, diabetes, hyperlipidemia, CKD, CHF on 2 L of oxygen chronically at home, previous history of DVT who presents to the emergency department with shortness of breath.  Patient denies any pain.  No known fevers or cough.  Spoke with [place] who takes care of patient at [facility].  She states the patient began complaining of shortness of breath around midnight tonight.  She states her sats were 92% on her normal 2 L nasal cannula.  At 2 AM she began complaining of more shortness of breath but oxygen appeared to be stable.  At 4 AM sats were 88% on 2 L nasal cannula.  Staff contacted nurse practitioner on-call for transport to the hospital.  They report right before EMS arrived her sats were 78% on 2 L nasal cannula.  Patient does have a DNR but when I talked to her about this and her goals of care she states that "I just want to be able to breathe" and when clarifying CODE STATUS she states "I do not know".  Granddaughter has been contacted by nursing home staff but is not currently at bedside.  Currently on a nonrebreather placed by EMS.      Past Medical History:  Diagnosis Date   Actinic keratosis 01/22/2020   right forearm   Acute deep vein thrombosis (DVT) of distal vein of right lower extremity ([place]) 12/2013   Acute thrombus of R proximal gastrocnemius vein. Symptomatic provoked distal DVT   Anxiety    Atherosclerosis of aorta ([place]) 09/22/2020   Basal cell carcinoma 10/30/2018   R nasal bridge, treated with Moh's   Basal cell carcinoma 01/22/2020   left  forearm, nodular   Cellulitis of leg, left 01/16/2014   Cerebral atrophy ([place]) 09/22/2020   Head CT 09/21/20 finding   Cerebral infarct ([employer])    Head CT 09/21/20: remote LEFT frontoparietal white matter infarct.   Cerumen impaction 04/16/2013   Saw Dr Richardson Landry 02/2018 s/p cleaning   CHF (congestive heart failure) ([place])    Chronic back pain 03/2012   lumbar DDD with herniation - s/p L S1 transforaminal ESI (10/2012 Dr. Sharlet Salina)   Chronic respiratory failure with hypoxia, on home O2 therapy ([place]) 09/22/2020   Chronic venous insufficiency    Coronary artery disease    CABG 1998   Cystocele    Depression    Diabetes mellitus    type 2, followed by endo.   Diastolic dysfunction    Diverticulitis of duodenum    Diverticulosis    Duodenal ulcer perforation ([place]) 02/08/2019   Erythema of breast 04/18/2018   Hearing loss 09/22/2020   Hemorrhoids    Hip fracture ([place]) 09/05/2020   History of heart attack    History of recurrent UTIs 09/22/2020   Hx of echocardiogram    a. Echo 10/13: mild LVH, EF 55-60%, Gr 1 diast dysfn, PASP 32   Hyperlipidemia    Hypertension    Irritable bowel syndrome    Lethargy 07/27/2019   Microcytic anemia    MRSA cellulitis 03/31/2018   Osteoporosis 09/22/2020   PAD (peripheral artery disease) ([place]) 2013  ABI: R 0.91, L 0.83   Peptic ulcer disease    Pneumonia 02/15/2019   Postmenopausal osteoporosis 01/2019   T score of -2.6   PUD (peptic ulcer disease)    Sphenoid sinusitis 09/21/2020   Head CT 09/21/20: New RIGHT sphenoid sinus fluid/acute sinusitis.   Varicose veins    Venous ulcer of leg ([place]) 12/12/2013   Required prolonged treatment by wound center   Viral gastroenteritis due to [place] virus 05/13/2015   hospitalization    Patient Active Problem List   Diagnosis Date Noted   Atrial fibrillation with RVR ([place]) 09/22/2020   Cerebral atrophy ([place]) 09/22/2020   Atherosclerosis of aorta ([place]) 09/22/2020   Osteoporosis 09/22/2020    Bilirubinemia 09/22/2020   Leukocytosis 09/22/2020   Pyuria 09/22/2020   History of recurrent UTIs 09/22/2020   Hearing loss 09/22/2020   Chronic respiratory failure with hypoxia, on home O2 therapy ([place]) 09/22/2020   SIRS (systemic inflammatory response syndrome) (HCC)    Altered mental status, unspecified 09/21/2020   Postoperative anemia due to acute blood loss 09/13/2020   Displaced intertrochanteric fracture of right femur, initial encounter for closed fracture ([place]) 09/05/2020   Hip fracture due to osteoporosis with routine healing 09/05/2020   Financial difficulty 02/26/2020   UTI (urinary tract infection) 10/01/2019   CKD stage 3 due to type 2 diabetes mellitus ([place]) 09/02/2019   History of CVA in adulthood 08/23/2019   Polypharmacy 07/27/2019   E coli bacteremia 02/13/2019   Dyspnea    Chronic leg pain 11/14/2018   Atherosclerosis of native arteries of the extremities with ulceration ([place]) 08/21/2018   Chronic constipation 06/18/2018   Advanced directives, counseling/discussion 01/29/2018   Bilateral hearing loss 01/29/2018   Spinal stenosis of lumbosacral region 66/44/0347   Systolic murmur 42/59/5638   De Quervain's tenosynovitis, left 01/09/2017   Encounter for chronic pain management 02/09/2016   Paroxysmal atrial fibrillation (HCC)    Varicose veins of left lower extremity with inflammation, with ulcer of calf limited to breakdown of skin ([place]) 05/22/2015   Varicose veins of right lower extremity with inflammation 01/23/2015   Diabetes mellitus type 2 with peripheral artery disease (HCC)    Chronic venous insufficiency    Bleeding from varicose veins of lower extremity, left 02/13/2014   Diabetic polyneuropathy ([place]) 11/19/2013   Right shoulder pain 08/19/2013   Abdominal pain 05/14/2012   Unsteady gait 12/13/2011   Chronic lower back pain 09/14/2011   Hyperlipidemia associated with type 2 diabetes mellitus ([place]) 09/06/2010   GAD (generalized anxiety disorder)  03/15/2010   Obesity, Class I, BMI 30-34.9 03/15/2010   Hypertension associated with type 2 diabetes mellitus ([place]) 03/15/2010   (HFpEF) heart failure with preserved ejection fraction ([place]) 03/15/2010   Coronary artery disease     Past Surgical History:  Procedure Laterality Date   ABDOMINAL HYSTERECTOMY  04/04/1973   TAH,BSO   ABI  04/05/2011   R 0.91, L 0.83   ANGIOPLASTY Right 09/06/2018   Lucky Cowboy, MD (Vasc): PCTA peroneal a; PCTL SFA; PTLA popliteal a; DES stent right SFR and popliteal a   APPENDECTOMY     per pt report   CARDIOVASCULAR STRESS TEST  04/04/2013   low risk myoview (N[place])   CATARACT SURGERY     CHOLECYSTECTOMY     COLONOSCOPY  02/02/2010   extensive diverticulosis throughout, small internal hemorrhoids, rec rpt 5 yrs (Dr. Allyn Kenner)   CORONARY ARTERY BYPASS GRAFT  04/04/1996   dexa scan  09/03/2011   femur -0.8, forearm 0.6 =>  normal   ENDOVENOUS ABLATION SAPHENOUS VEIN W/ LASER Left 03/04/2014   Kellie Simmering   [facility]  04/04/2012   L S1 transforaminal ESI x2 ([place]) without improvement   FEMORAL ARTERY STENT Right 09/06/2018   Lucky Cowboy, MD (Vasc): PCTA peroneal a; PCTL SFA; PTLA popliteal a; DES stent right SFR and popliteal a   HAND SURGERY     INTRAMEDULLARY (IM) NAIL INTERTROCHANTERIC Right 09/07/2020   Procedure: INTRAMEDULLARY (IM) NAIL INTERTROCHANTRIC;  Surgeon: Leandrew Koyanagi, MD;  Location: [place];  Service: Orthopedics;  Laterality: Right;   lexiscan myoview  03/05/2011   Small basal inferolateral and anterolateral reversible perfusion defect suggests ischemia.  EF was normal.   old infarct, no new ischemia   LOWER EXTREMITY ANGIOGRAPHY Right 09/06/2018   Procedure: LOWER EXTREMITY ANGIOGRAPHY;  Surgeon: Algernon Huxley, MD;  Location: [place] [facility];  Service: Cardiovascular;  Laterality: Right;   POPLITEAL ARTERY STENT Right 09/06/2018   Lucky Cowboy, MD (Vasc): PCTA peroneal a; PCTL SFA; PTLA popliteal a; DES stent right SFR and popliteal a   toe surgery      TYMPANOPLASTY  1960s   R side   Vault prolapse A & P repair  04/05/2007   [facility]    Prior to Admission medications   Medication Sig Start Date End Date Taking? Authorizing Provider  acetaminophen (TYLENOL) 325 MG tablet Take 2 tablets (650 mg total) by mouth every 6 (six) hours. 09/25/20   Maness, Arnette Norris, MD  ALPRAZolam Duanne Moron) 1 MG tablet Take 0.5 tablets (0.5 mg total) by mouth at bedtime as needed for anxiety. 09/14/20   Maness, Arnette Norris, MD  alum & mag hydroxide-simeth (MAALOX PLUS) 400-400-40 MG/5ML suspension Take 30 mLs by mouth every 4 (four) hours as needed for indigestion. Almacone-2    [provider]  B Complex-C (B-COMPLEX WITH VITAMIN C) tablet Take 1 tablet by mouth every morning.    [provider]  bisacodyl (DULCOLAX) 10 MG suppository Place 10 mg rectally daily as needed (constipation not relieved by MOM).    [provider]  diclofenac sodium (VOLTAREN) 1 % GEL Apply 1 application topically 3 (three) times daily. 01/09/17   Ria Bush, MD  DILT-XR 120 MG 24 hr capsule TAKE 1 CAPSULE BY MOUTH EVERY DAY 04/09/20   Ria Bush, MD  ELIQUIS 5 MG TABS tablet TAKE 1 TABLET BY MOUTH TWICE A DAY 04/09/20   Nahser, Wonda Cheng, MD  Ensure (ENSURE) Take 237 mLs by mouth every morning.    [provider]  famotidine (PEPCID) 20 MG tablet Take 1 tablet (20 mg total) by mouth daily as needed for heartburn (breakthrough heartburn). 07/30/19   Ria Bush, MD  fluticasone [facility]) 50 MCG/ACT nasal spray Place 1 spray into both nostrils daily as needed for allergies. 09/23/19   [provider]  furosemide (LASIX) 20 MG tablet Take 1 tablet (20 mg total) by mouth daily. 09/26/20   Maness, Arnette Norris, MD  GLUCOSAMINE-CHONDROITIN PO Take 1 tablet by mouth every morning.    [provider]  Lancets [facility] DELICA PLUS [id number]) MISC Use to test blood sugar once daily dx code E11.9 04/08/20   Elayne Snare, MD  loratadine  (CLARITIN) 10 MG tablet Take 10 mg by mouth daily as needed for allergies.    [provider]  melatonin 3 MG TABS tablet Take 1 tablet (3 mg total) by mouth at bedtime. 09/25/20   Maness, Arnette Norris, MD  metoprolol tartrate (LOPRESSOR) 50 MG tablet TAKE 1 TABLET BY  MOUTH TWICE A DAY 04/09/20   Ria Bush, MD  Multiple Vitamins-Minerals (ICAPS) CAPS Take 1 capsule by mouth every morning.    [provider]  nitroGLYCERIN (NITROSTAT) 0.4 MG SL tablet Place 1 tablet (0.4 mg total) under the tongue every 5 (five) minutes as needed for chest pain. 02/02/17   Nahser, Wonda Cheng, MD  ondansetron (ZOFRAN) 4 MG tablet Take 1 tablet (4 mg total) by mouth every 6 (six) hours as needed for nausea. 09/11/20   Maness, Arnette Norris, MD  [facility] VERIO test strip USE TO TEST BLOOD SUGAR ONCE DAILY. DX:E11.9 04/09/20   Elayne Snare, MD  oxyCODONE (OXY IR/ROXICODONE) 5 MG immediate release tablet Take 0.5 tablets (2.5 mg total) by mouth every 8 (eight) hours as needed for breakthrough pain. 09/25/20   Maness, Arnette Norris, MD  OXYGEN Inhale 2 L into the lungs continuous.    [provider]  pantoprazole (PROTONIX) 40 MG tablet TAKE 1 TABLET BY MOUTH TWICE A DAY 04/09/20   Ria Bush, MD  pioglitazone (ACTOS) 15 MG tablet TAKE 1 TABLET BY MOUTH EVERY DAY 04/09/20   Ria Bush, MD  polyethylene glycol powder (GLYCOLAX/MIRALAX) 17 GM/SCOOP powder Dissolve 1 capful (17 g) in water and drink daily. 09/26/20   Maness, Arnette Norris, MD  potassium chloride (MICRO-K) 10 MEQ CR capsule TAKE ONE CAPSULE BY MOUTH ONCE DAILY 08/21/20   Ria Bush, MD  pravastatin (PRAVACHOL) 20 MG tablet Take 1 tablet (20 mg total) by mouth daily. 05/27/20   Ria Bush, MD  Probiotic Product (ALIGN) 4 MG CAPS Take 1 capsule (4 mg total) by mouth daily. 08/15/17   Ria Bush, MD  sertraline (ZOLOFT) 100 MG tablet Take 1 tablet (100 mg total) by mouth daily. 09/26/20   Maness, Arnette Norris, MD  sitaGLIPtin (JANUVIA) 50 MG  tablet Take 50 mg by mouth every morning.    [provider]  Sodium Phosphates (RA SALINE ENEMA RE) Place 1 enema rectally daily as needed (constipation not relieved by Bisacodyl suppository).    [provider]  Vitamin E 450 MG (1000 UT) CAPS Take 450 mg by mouth every morning.    [provider]    Allergies Metformin and related  Family History  Problem Relation Age of Onset   Hypertension Mother    Stroke Mother    Diabetes Father    Diabetes Sister    Diabetes Brother    Heart disease Son    Colon cancer Neg Hx     Social History Social History   Tobacco Use   Smoking status: Former    Pack years: 0.00    Types: Cigarettes    Quit date: 03/05/1979    Years since quitting: 41.6   Smokeless tobacco: Never  Vaping Use   Vaping Use: Never used  Substance Use Topics   Alcohol use: No    Alcohol/week: 0.0 standard drinks   Drug use: No    Review of Systems Constitutional: No fever. Eyes: No visual changes. ENT: No sore throat. Cardiovascular: Denies chest pain. Respiratory: + shortness of breath. Gastrointestinal: No nausea, vomiting, diarrhea. Genitourinary: Negative for dysuria. Musculoskeletal: Negative for back pain. Skin: Negative for rash. Neurological: Negative for focal weakness or numbness.  ____________________________________________   PHYSICAL EXAM:  VITAL SIGNS: ED Triage Vitals  Enc Vitals Group     BP --      Pulse Rate 10/01/20 0505 99     Resp 10/01/20 0505 (!) 24     Temp --  Temp src --      SpO2 10/01/20 0504 99 %     Weight 10/01/20 0506 136 lb 11 oz (62 kg)     Height 10/01/20 0506 4\' 6"  (1.372 m)     Head Circumference --      Peak Flow --      Pain Score 10/01/20 0506 0     Pain Loc --      Pain Edu? --      Excl. in [place]? --    CONSTITUTIONAL: Alert and oriented and responds appropriately to questions.  Elderly, chronically ill-appearing, hard of hearing HEAD: Normocephalic EYES:  Conjunctivae clear, pupils appear equal, EOM appear intact ENT: normal nose; moist mucous membranes NECK: Supple, normal ROM CARD: Regular and tachycardic; S1 and S2 appreciated; no murmurs, no clicks, no rubs, no gallops RESP: Patient is tachypneic and speaking 1-2 words at a time.  She is in respiratory distress.  Currently on a nonrebreather satting 97%.  She has bibasilar [place].  Diminished aeration diffusely.  No rhonchi or wheezing. ABD/GI: Normal bowel sounds; non-distended; soft, non-tender, no rebound, no guarding, no peritoneal signs, no hepatosplenomegaly BACK: The back appears normal EXT: Normal ROM in all joints; no deformity noted, no edema; no cyanosis, no calf tenderness or calf swelling SKIN: Normal color for age and race; warm; no rash on exposed skin NEURO: Moves all extremities equally PSYCH: The patient's mood and manner are appropriate.  ____________________________________________   LABS (all labs ordered are listed, but only abnormal results are displayed)  Labs Reviewed  CBC WITH DIFFERENTIAL/PLATELET - Abnormal; Notable for the following components:      Result Value   RBC 3.71 (*)    Hemoglobin 10.6 (*)    HCT 35.2 (*)    RDW 15.9 (*)    Abs Immature Granulocytes 0.13 (*)    All other components within normal limits  BASIC METABOLIC PANEL - Abnormal; Notable for the following components:   CO2 33 (*)    Glucose, Bld 172 (*)    All other components within normal limits  BRAIN NATRIURETIC PEPTIDE - Abnormal; Notable for the following components:   B Natriuretic Peptide 455.4 (*)    All other components within normal limits  BLOOD GAS, ARTERIAL - Abnormal; Notable for the following components:   pH, Arterial 7.34 (*)    pCO2 arterial 61 (*)    Bicarbonate 32.9 (*)    Acid-Base Excess 5.7 (*)    All other components within normal limits  D-DIMER, QUANTITATIVE - Abnormal; Notable for the following components:   D-Dimer, Quant 2.51 (*)    All other  components within normal limits  RESP PANEL BY RT-PCR (FLU A&B, COVID) [id number]  BLOOD GAS, ARTERIAL  TROPONIN I (HIGH SENSITIVITY)   ____________________________________________  EKG   EKG Interpretation  Date/Time:  Thursday October 01 2020 05:08:49 EDT Ventricular Rate:  104 PR Interval:  154 QRS Duration: 90 QT Interval:  406 QTC Calculation: 535 R Axis:   21 Text Interpretation: Sinus tachycardia Ventricular premature complex Sinus pause Low voltage, precordial leads Repol abnrm suggests ischemia, lateral leads Prolonged QT interval Confirmed by Pryor Curia [phone]) on 10/01/2020 5:50:50 AM          ____________________________________________  RADIOLOGY Jessie Foot Cameron Schwinn, personally viewed and evaluated these images (plain radiographs) as part of my medical decision making, as well as reviewing the written report by the radiologist.  ED MD interpretation: Chest x-ray shows pulmonary edema.  Official radiology report(s): DG Chest  Portable 1 View  Result Date: 10/01/2020 CLINICAL DATA:  Shortness of breath.  Hypoxia. EXAM: PORTABLE CHEST 1 VIEW COMPARISON:  09/21/2020. FINDINGS: Prior CABG. Cardiomegaly. Bilateral pulmonary infiltrates/edema. Small left pleural effusion. No pneumothorax. IMPRESSION: Prior CABG. Cardiomegaly. Bilateral pulmonary infiltrates/edema. Small left pleural effusion. Findings suggest congestive heart failure. Bilateral pneumonia cannot be excluded. Electronically Signed   By: Marcello Moores  Register   On: 10/01/2020 05:52   XR HIP UNILAT W OR W/O PELVIS 2-3 VIEWS RIGHT  Result Date: 09/30/2020 Stable alignment of the fracture without hardware complication   ____________________________________________   PROCEDURES  Procedure(s) performed (including Critical Care):  Procedures  CRITICAL CARE Performed by: Pryor Curia   Total critical care time: 55 minutes  Critical care time was exclusive of separately billable procedures and treating other  patients.  Critical care was necessary to treat or prevent imminent or life-threatening deterioration.  Critical care was time spent personally by me on the following activities: development of treatment plan with patient and/or surrogate as well as nursing, discussions with consultants, evaluation of patient's response to treatment, examination of patient, obtaining history from patient or surrogate, ordering and performing treatments and interventions, ordering and review of laboratory studies, ordering and review of radiographic studies, pulse oximetry and re-evaluation of patient's condition.  ____________________________________________   INITIAL IMPRESSION / ASSESSMENT AND PLAN / ED COURSE  As part of my medical decision making, I reviewed the following data within the [place] notes reviewed and incorporated, Labs reviewed , EKG interpreted , Old EKG reviewed, Old chart reviewed, Radiograph reviewed , Discussed with admitting physician , and Notes from prior ED visits         Patient here with respiratory distress, hypoxia on her normal 2 L nasal cannula.  Placed on BiPAP immediately upon arrival due to work of breathing.  ABG reveals mild respiratory acidosis.  I suspect that she is having a CHF exacerbation.  She is not aware of any history of asthma or COPD.  Differential also includes pneumonia, COVID-19, PE.  Her rectal temperature here today is normal.  I do not appreciate any signs suggestive of pneumothorax.  Will obtain labs.  EKG shows no new ischemic change.  Anticipate admission.  ED PROGRESS  Patient improving clinically on BiPAP.  Blood pressure also coming down as well as heart rate.  Chest x-ray concerning for pulmonary edema.  Doubt pneumonia given she has no fever, cough or leukocytosis.  Troponin negative.  BNP elevated at 455.  Will give IV Lasix for diuresis.  She does have an elevated D-dimer but given that she is on Eliquis and we have  another source for her shortness of breath and respiratory distress I think it is unlikely that she has a PE but I will obtain a CTA of her chest.  She is COVID and flu negative.  I spoke to her granddaughter over the phone who reports patient is a DNR/DNI.  She reports patient has progressively getting more short of breath over the past several days.  She is comfortable with plan for admission.  6:27 AM Discussed patient's case with hospitalist, Dr. Sidney Ace.  I have recommended admission and patient (and family if present) agree with this plan. Admitting physician will place admission orders.   I reviewed all nursing notes, vitals, pertinent previous records and reviewed/interpreted all EKGs, lab and urine results, imaging (as available).  ____________________________________________   FINAL CLINICAL IMPRESSION(S) / ED DIAGNOSES  Final diagnoses:  Shortness of breath  Respiratory  acidosis  Acute on chronic congestive heart failure, unspecified heart failure type ([place])  Acute respiratory failure with hypoxia [facility])     ED Discharge Orders     None       *Please note:  [name] was evaluated in Emergency Department on 10/01/2020 for the symptoms described in the history of present illness. She was evaluated in the context of the global COVID-19 pandemic, which necessitated consideration that the patient might be at risk for infection with the SARS-CoV-2 virus that causes COVID-19. Institutional protocols and algorithms that pertain to the evaluation of patients at risk for COVID-19 are in a state of rapid change based on information released by regulatory bodies including the CDC and federal and state organizations. These policies and algorithms were followed during the patient's care in the ED.  Some ED evaluations and interventions may be delayed as a result of limited staffing during and the pandemic.*   Note:  This document was prepared using Dragon voice recognition software and  may include unintentional dictation errors.    Ned Kakar, Delice Bison, DO 10/01/20 [phone]

## 2020-10-01 NOTE — ED Notes (Signed)
ED Provider at bedside. 

## 2020-10-02 ENCOUNTER — Other Ambulatory Visit (HOSPITAL_COMMUNITY): Payer: Medicare Other

## 2020-10-02 ENCOUNTER — Inpatient Hospital Stay (HOSPITAL_COMMUNITY)
Admit: 2020-10-02 | Discharge: 2020-10-02 | Disposition: A | Discharge status: Home / Self Care | Payer: Medicare Other | Attending: Internal Medicine | Admitting: Internal Medicine

## 2020-10-02 ENCOUNTER — Ambulatory Visit: Payer: Medicare Other | Admitting: Family

## 2020-10-02 DIAGNOSIS — J9622 Acute and chronic respiratory failure with hypercapnia: Secondary | ICD-10-CM

## 2020-10-02 DIAGNOSIS — I482 Chronic atrial fibrillation, unspecified: Secondary | ICD-10-CM

## 2020-10-02 DIAGNOSIS — I5033 Acute on chronic diastolic (congestive) heart failure: Secondary | ICD-10-CM

## 2020-10-02 DIAGNOSIS — I5031 Acute diastolic (congestive) heart failure: Secondary | ICD-10-CM

## 2020-10-02 DIAGNOSIS — J9621 Acute and chronic respiratory failure with hypoxia: Secondary | ICD-10-CM

## 2020-10-02 LAB — ECHOCARDIOGRAM COMPLETE
AR max vel: 1.22 cm2
AV Area VTI: 1.24 cm2
AV Area mean vel: 1.22 cm2
AV Mean grad: 4 mmHg
AV Peak grad: 7.8 mmHg
Ao pk vel: 1.4 m/s
Area-P 1/2: 5.29 cm2
Height: 54 in
MV VTI: 1.04 cm2
S' Lateral: 2.8 cm
Weight: 2225.76 oz

## 2020-10-02 LAB — BASIC METABOLIC PANEL
Anion gap: 7 (ref 5–15)
BUN: 19 mg/dL (ref 8–23)
CO2: 38 mmol/L — ABNORMAL HIGH (ref 22–32)
Calcium: 8.7 mg/dL — ABNORMAL LOW (ref 8.9–10.3)
Chloride: 95 mmol/L — ABNORMAL LOW (ref 98–111)
Creatinine, Ser: 0.66 mg/dL (ref 0.44–1.00)
GFR, Estimated: >60 mL/min (ref >60)
Glucose, Bld: 126 mg/dL — ABNORMAL HIGH (ref 70–99)
Potassium: 3.6 mmol/L (ref 3.5–5.1)
Sodium: 140 mmol/L (ref 135–145)

## 2020-10-02 LAB — MAGNESIUM: Magnesium: 1.4 mg/dL — ABNORMAL LOW (ref 1.7–2.4)

## 2020-10-02 LAB — GLUCOSE, CAPILLARY
Glucose-Capillary: 159 mg/dL — ABNORMAL HIGH (ref 70–99)
Glucose-Capillary: 227 mg/dL — ABNORMAL HIGH (ref 70–99)
Glucose-Capillary: 151 mg/dL — ABNORMAL HIGH (ref 70–99)
Glucose-Capillary: 150 mg/dL — ABNORMAL HIGH (ref 70–99)

## 2020-10-02 MED ORDER — MAGNESIUM SULFATE 4 GM/100ML IV SOLN
4.0000 g | Freq: Once | INTRAVENOUS | Status: AC
  Administered 2020-10-02: 4 g via INTRAVENOUS
  Filled 2020-10-02: qty 100

## 2020-10-02 MED ORDER — TRAZODONE HCL 50 MG PO TABS
25.0000 mg | ORAL_TABLET | Freq: Every evening | ORAL | Status: DC | PRN
  Administered 2020-10-04 – 2020-10-06 (×3): 25 mg via ORAL
  Filled 2020-10-02 (×5): qty 1

## 2020-10-02 NOTE — Progress Notes (Signed)
PROGRESS NOTE    [name]  OVZ:[id number] DOB: [birth year] DOA: 10/01/2020 PCP: Ria Bush, MD   Chief complaint.  Shortness of breath. Brief Narrative:  [name] is a 87 y.o. female with medical history significant of dCHF, on 2 L oxygen at home, hypertension, hyperlipidemia, diabetes mellitus, stroke, GERD, depression, anxiety, atrial fibrillation and DVT on Eliquis, varicose vein, peptic ulcer disease, duodenal ulcer with perforation, PAD, anemia, UTI, diverticulitis, CAD, CABG, CKD stage III, who presents with shortness of breath.  She also developed a worsening hypoxemia. CT chest did not show any evidence of pulmonary emboli.  She is placed on IV Lasix for acute on chronic diastolic congestive heart failure.  Assessment & Plan:   Principal Problem:   Acute on chronic diastolic CHF (congestive heart failure) (HCC) Active Problems:   Coronary artery disease   Hypertension associated with type 2 diabetes mellitus (HCC)   Hyperlipidemia associated with type 2 diabetes mellitus (HCC)   Diabetes mellitus type 2 with peripheral artery disease (HCC)   Acute on chronic respiratory failure with hypoxia and hypercapnia (HCC)   History of CVA in adulthood   Atrial fibrillation, chronic (HCC)   Depression with anxiety   DVT (deep venous thrombosis) ([place])  #1.  Acute on chronic respiratory failure with hypoxemia and hypercapnia. Acute on chronic diastolic congestive heart failure. Coronary artery disease. Chronic atrial fibrillation. Patient short of breath is improving after Lasix.  Still on oxygen.  We will continue 24 hours of IV Lasix again.  Recheck a BMP and magnesium. Patient heart rate is controlled, continue Eliquis.  #2.  Uncontrolled type 2 diabetes with hyperglycemia. Continue sliding scale insulin.  3.  History of DVT. Continue Eliquis.  4.  History of stroke. Continue statin and Eliquis.  #5.  Hypomagnesemia Supplement.  DVT prophylaxis:  Eliquis Code Status: DNR Family Communication:  Disposition Plan:    Status is: Inpatient  Remains inpatient appropriate because:Inpatient level of care appropriate due to severity of illness  Dispo: The patient is from: SNF              Anticipated d/c is to: SNF              Patient currently is not medically stable to d/c.   Difficult to place patient No        I/O last 3 completed shifts: In: 240 [P.O.:240] Out: 3450 [Urine:3450] Total I/O In: 26 [P.O.:460] Out: 1600 [Urine:1600]     Consultants:  None  Procedures: None  Antimicrobials: None  Subjective: Patient still has short of breath with exertion.  Very hard of hearing. Cough, nonproductive. No abdominal pain nausea vomiting. No dysuria hematuria. No fever or chills. No headache or dizziness.  Objective: Vitals:   10/02/20 0400 10/02/20 0713 10/02/20 0800 10/02/20 1130  BP: (!) 141/87  119/76 (!) 102/53  Pulse: (!) 103  (!) 119 70  Resp: 20  18 18   Temp: 97.7 F (36.5 C)  97.9 F (36.6 C) 97.8 F (36.6 C)  TempSrc: Oral  Oral Oral  SpO2: 99%   99%  Weight:  63.1 kg    Height:        Intake/Output Summary (Last 24 hours) at 10/02/2020 1158 Last data filed at 10/02/2020 0900 Gross per 24 hour  Intake 700 ml  Output 3800 ml  Net -3100 ml   Filed Weights   10/01/20 0506 10/02/20 0713  Weight: 62 kg 63.1 kg    Examination:  General exam: Appears calm  and comfortable  Respiratory system: Crackles in the base. Respiratory effort normal. Cardiovascular system: Irregular RRR. No JVD, murmurs, rubs, gallops or clicks. No pedal edema. Gastrointestinal system: Abdomen is nondistended, soft and nontender. No organomegaly or masses felt. Normal bowel sounds heard. Central nervous system: Alert and oriented x3. No focal neurological deficits. Extremities: Symmetric 5 x 5 power. Skin: No rashes, lesions or ulcers Psychiatry: Mood & affect appropriate.     Data Reviewed: I have personally  reviewed following labs and imaging studies  CBC: Recent Labs  Lab 10/01/20 0512  WBC 10.4  NEUTROABS 7.5  HGB 10.6*  HCT 35.2*  MCV 94.9  PLT 295   Basic Metabolic Panel: Recent Labs  Lab 10/01/20 0512 10/02/20 0429  NA 138 140  K 4.5 3.6  CL 98 95*  CO2 33* 38*  GLUCOSE 172* 126*  BUN 21 19  CREATININE 0.58 0.66  CALCIUM 9.2 8.7*  MG 1.5* 1.4*   GFR: Estimated Creatinine Clearance: 32 mL/min (by C-G formula based on SCr of 0.66 mg/dL). Liver Function Tests: No results for input(s): AST, ALT, ALKPHOS, BILITOT, PROT, ALBUMIN in the last 168 hours. No results for input(s): LIPASE, AMYLASE in the last 168 hours. No results for input(s): AMMONIA in the last 168 hours. Coagulation Profile: No results for input(s): INR, PROTIME in the last 168 hours. Cardiac Enzymes: No results for input(s): CKTOTAL, CKMB, CKMBINDEX, TROPONINI in the last 168 hours. BNP (last 3 results) No results for input(s): PROBNP in the last 8760 hours. HbA1C: No results for input(s): HGBA1C in the last 72 hours. CBG: Recent Labs  Lab 10/01/20 1210 10/01/20 1658 10/01/20 2115 10/02/20 0808 10/02/20 1132  GLUCAP 127* 114* 233* 159* 227*   Lipid Profile: No results for input(s): CHOL, HDL, LDLCALC, TRIG, CHOLHDL, LDLDIRECT in the last 72 hours. Thyroid Function Tests: No results for input(s): TSH, T4TOTAL, [name]4, T3FREE, THYROIDAB in the last 72 hours. Anemia Panel: No results for input(s): VITAMINB12, FOLATE, FERRITIN, TIBC, IRON, RETICCTPCT in the last 72 hours. Sepsis Labs: Recent Labs  Lab 10/01/20 0512  PROCALCITON <0.10    Recent Results (from the past 240 hour(s))  SARS CORONAVIRUS 2 (TAT 6-24 HRS) Nasopharyngeal Nasopharyngeal Swab     Status: None   Collection Time: 09/24/20  2:25 PM   Specimen: Nasopharyngeal Swab  Result Value Ref Range Status   SARS Coronavirus 2 NEGATIVE NEGATIVE Final    Comment: (NOTE) SARS-CoV-2 target nucleic acids are NOT DETECTED.  The  SARS-CoV-2 RNA is generally detectable in upper and lower respiratory specimens during the acute phase of infection. Negative results do not preclude SARS-CoV-2 infection, do not rule out co-infections with other pathogens, and should not be used as the sole basis for treatment or other patient management decisions. Negative results must be combined with clinical observations, patient history, and epidemiological information. The expected result is Negative.  Fact Sheet for Patients: SugarRoll.be  Fact Sheet for Healthcare Providers: https://www.woods-mathews.com/  This test is not yet approved or cleared by the [place] FDA and  has been authorized for detection and/or diagnosis of SARS-CoV-2 by FDA under an Emergency Use Authorization (EUA). This EUA will remain  in effect (meaning this test can be used) for the duration of the COVID-19 declaration under Se ction 564(b)(1) of the Act, 21 U.S.C. section 360bbb-3(b)(1), unless the authorization is terminated or revoked sooner.  Performed at [facility], [place] [address] [place], [place] [address]   Resp Panel by RT-PCR (Flu A&B, Covid) Nasopharyngeal  Swab     Status: None   Collection Time: 10/01/20  5:12 AM   Specimen: Nasopharyngeal Swab; Nasopharyngeal(NP) swabs in vial transport medium  Result Value Ref Range Status   SARS Coronavirus 2 by RT PCR NEGATIVE NEGATIVE Final    Comment: (NOTE) SARS-CoV-2 target nucleic acids are NOT DETECTED.  The SARS-CoV-2 RNA is generally detectable in upper respiratory specimens during the acute phase of infection. The lowest concentration of SARS-CoV-2 viral copies this assay can detect is 138 copies/mL. A negative result does not preclude SARS-Cov-2 infection and should not be used as the sole basis for treatment or other patient management decisions. A negative result may occur with  improper specimen collection/handling, submission of  specimen other than nasopharyngeal swab, presence of viral mutation(s) within the areas targeted by this assay, and inadequate number of viral copies(<138 copies/mL). A negative result must be combined with clinical observations, patient history, and epidemiological information. The expected result is Negative.  Fact Sheet for Patients:  EntrepreneurPulse.com.au  Fact Sheet for Healthcare Providers:  IncredibleEmployment.be  This test is no t yet approved or cleared by the [place] FDA and  has been authorized for detection and/or diagnosis of SARS-CoV-2 by FDA under an Emergency Use Authorization (EUA). This EUA will remain  in effect (meaning this test can be used) for the duration of the COVID-19 declaration under Section 564(b)(1) of the Act, 21 U.S.C.section 360bbb-3(b)(1), unless the authorization is terminated  or revoked sooner.       Influenza A by PCR NEGATIVE NEGATIVE Final   Influenza B by PCR NEGATIVE NEGATIVE Final    Comment: (NOTE) The Xpert Xpress SARS-CoV-2/FLU/RSV plus assay is intended as an aid in the diagnosis of influenza from Nasopharyngeal swab specimens and should not be used as a sole basis for treatment. Nasal washings and aspirates are unacceptable for Xpert Xpress SARS-CoV-2/FLU/RSV testing.  Fact Sheet for Patients: EntrepreneurPulse.com.au  Fact Sheet for Healthcare Providers: IncredibleEmployment.be  This test is not yet approved or cleared by the [place] FDA and has been authorized for detection and/or diagnosis of SARS-CoV-2 by FDA under an Emergency Use Authorization (EUA). This EUA will remain in effect (meaning this test can be used) for the duration of the COVID-19 declaration under Section 564(b)(1) of the Act, 21 U.S.C. section 360bbb-3(b)(1), unless the authorization is terminated or revoked.  Performed at [facility], [address], [place], [place] [address]          Radiology Studies: CT Angio Chest PE W and/or Wo Contrast  Result Date: 10/01/2020 CLINICAL DATA:  87 year old female with shortness of breath since 2000 hours. Hypoxic. EXAM: CT ANGIOGRAPHY CHEST WITH CONTRAST TECHNIQUE: Multidetector CT imaging of the chest was performed using the standard protocol during bolus administration of intravenous contrast. Multiplanar CT image reconstructions and MIPs were obtained to evaluate the vascular anatomy. CONTRAST:  33mL OMNIPAQUE IOHEXOL 350 MG/ML SOLN COMPARISON:  CTA chest 09/12/2020, and earlier. FINDINGS: Cardiovascular: Suboptimal but adequate contrast bolus timing in the pulmonary arterial tree. Mild respiratory motion. Compared to the recent CTA on 09/12/2020, There is no convincing filling defect identified in the pulmonary arteries to suggest acute pulmonary embolism. Prior CABG. Stable cardiomegaly. No pericardial effusion. Mild contrast in the thoracic aorta today. Mediastinum/Nodes: Mild reactive appearing mediastinal lymph nodes, slightly increased from earlier in the month. Lungs/Pleura: Increased layering bilateral pleural effusions, now small to moderate with simple fluid density and mild loculation on the left. Major airways remain patent. There is increased dependent  enhancing atelectasis, but also moderate new asymmetric peribronchial opacities in both the right upper and lower lobes. [place] to no pulmonary septal thickening. Upper Abdomen: Negative visible liver, spleen, stomach, adrenal glands and left renal upper pole. Musculoskeletal: Prior sternotomy. Osteopenia. Stable visualized osseous structures. Review of the MIP images confirms the above findings. IMPRESSION: 1. Suboptimal pulmonary artery contrast timing today but no pulmonary embolus identified. 2. Increased bilateral pleural effusions since June 11th with more atelectasis but new right upper and lower lobe confluent peribronchial opacity. Favor  right lung Multilobar Bronchopneumonia over asymmetric pulmonary edema. 3. Stable cardiomegaly. Prior CABG. Aortic Atherosclerosis (ICD10-I70.0). Electronically Signed   By: Genevie Ann M.D.   On: 10/01/2020 07:25   DG Chest Portable 1 View  Result Date: 10/01/2020 CLINICAL DATA:  Shortness of breath.  Hypoxia. EXAM: PORTABLE CHEST 1 VIEW COMPARISON:  09/21/2020. FINDINGS: Prior CABG. Cardiomegaly. Bilateral pulmonary infiltrates/edema. Small left pleural effusion. No pneumothorax. IMPRESSION: Prior CABG. Cardiomegaly. Bilateral pulmonary infiltrates/edema. Small left pleural effusion. Findings suggest congestive heart failure. Bilateral pneumonia cannot be excluded. Electronically Signed   By: Marcello Moores  Register   On: 10/01/2020 05:52        Scheduled Meds:  acidophilus  1 capsule Oral Daily   apixaban  5 mg Oral BID   B-complex with vitamin C  1 tablet Oral q morning   diclofenac sodium  4 g Topical TID   diltiazem  120 mg Oral Daily   feeding supplement  237 mL Oral q morning   furosemide  40 mg Intravenous Q12H   insulin aspart  0-5 Units Subcutaneous QHS   insulin aspart  0-9 Units Subcutaneous TID WC   melatonin  5 mg Oral QHS   metoprolol tartrate  50 mg Oral BID   multivitamin-lutein  1 capsule Oral q morning   pantoprazole  40 mg Oral BID   polyethylene glycol  17 g Oral Daily   pravastatin  20 mg Oral QHS   sertraline  100 mg Oral Daily   vitamin E  400 Units Oral q morning   Continuous Infusions:  magnesium sulfate bolus IVPB 4 g (10/02/20 1108)     LOS: 1 day    Time spent: 28 minutes    Sharen Hones, MD [facility]   To contact the attending provider between 7A-7P or the covering provider during after hours 7P-7A, please log into the web site [email or web address] and access using universal [place] password for that web site. If you do not have the password, please call the hospital operator.  10/02/2020, 11:58 AM

## 2020-10-02 NOTE — NC FL2 (Signed)
[place] LEVEL OF CARE SCREENING TOOL     IDENTIFICATION  Patient Name: [name] Birthdate: 1928-01-06 Sex: female Admission Date (Current Location): 10/01/2020  [facility] and [place] Number:  Engineering geologist and Address:  [facility], [address] [place], [place] [address]      Provider Number: [id number]  Attending Physician Name and Address:  Sharen Hones, MD  Relative Name and Phone Number:       Current Level of Care: Hospital Recommended Level of Care: [place] Prior Approval Number:    Date Approved/Denied:   PASRR Number: [id number]  Discharge Plan: SNF    Current Diagnoses: Patient Active Problem List   Diagnosis Date Noted   Depression with anxiety 10/01/2020   DVT (deep venous thrombosis) ([place]) 10/01/2020   Acute on chronic diastolic CHF (congestive heart failure) ([place]) 10/01/2020   Atrial fibrillation, chronic ([place]) 09/22/2020   Cerebral atrophy ([place]) 09/22/2020   Atherosclerosis of aorta ([place]) 09/22/2020   Osteoporosis 09/22/2020   Bilirubinemia 09/22/2020   Leukocytosis 09/22/2020   Pyuria 09/22/2020   History of recurrent UTIs 09/22/2020   Hearing loss 09/22/2020   Chronic respiratory failure with hypoxia, on home O2 therapy ([place]) 09/22/2020   SIRS (systemic inflammatory response syndrome) ([place])    Altered mental status, unspecified 09/21/2020   Postoperative anemia due to acute blood loss 09/13/2020   Displaced intertrochanteric fracture of right femur, initial encounter for closed fracture ([place]) 09/05/2020   Hip fracture due to osteoporosis with routine healing 09/05/2020   Financial difficulty 02/26/2020   UTI (urinary tract infection) 10/01/2019   CKD stage 3 due to type 2 diabetes mellitus ([place]) 09/02/2019   History of CVA in adulthood 08/23/2019   Polypharmacy 07/27/2019   E coli bacteremia 02/13/2019   Dyspnea    Acute on chronic respiratory failure with hypoxia and  hypercapnia (HCC)    Chronic leg pain 11/14/2018   Atherosclerosis of native arteries of the extremities with ulceration ([place]) 08/21/2018   Chronic constipation 06/18/2018   Advanced directives, counseling/discussion 01/29/2018   Bilateral hearing loss 01/29/2018   Spinal stenosis of lumbosacral region 11/94/1740   Systolic murmur 81/44/8185   De Quervain's tenosynovitis, left 01/09/2017   Encounter for chronic pain management 02/09/2016   Paroxysmal atrial fibrillation (HCC)    Varicose veins of left lower extremity with inflammation, with ulcer of calf limited to breakdown of skin ([place]) 05/22/2015   Varicose veins of right lower extremity with inflammation 01/23/2015   Diabetes mellitus type 2 with peripheral artery disease (HCC)    Chronic venous insufficiency    Bleeding from varicose veins of lower extremity, left 02/13/2014   Diabetic polyneuropathy ([place]) 11/19/2013   Right shoulder pain 08/19/2013   Abdominal pain 05/14/2012   Unsteady gait 12/13/2011   Chronic lower back pain 09/14/2011   Hyperlipidemia associated with type 2 diabetes mellitus ([place]) 09/06/2010   GAD (generalized anxiety disorder) 03/15/2010   Obesity, Class I, BMI 30-34.9 03/15/2010   Hypertension associated with type 2 diabetes mellitus ([place]) 03/15/2010   (HFpEF) heart failure with preserved ejection fraction ([place]) 03/15/2010   Coronary artery disease     Orientation RESPIRATION BLADDER Height & Weight     Self, Time, Situation, Place  O2 ([place] 3L) Incontinent Weight: 139 lb 1.8 oz (63.1 kg) Height:  4\' 6"  (137.2 cm)  BEHAVIORAL SYMPTOMS/MOOD NEUROLOGICAL BOWEL NUTRITION STATUS      Continent Diet (2 gram sodium diet)  AMBULATORY STATUS COMMUNICATION OF NEEDS  Skin     Verbally Surgical wounds (closed incision right hip)                       Personal Care Assistance Level of Assistance  Dressing, Feeding, Bathing           Functional Limitations Info  Sight, Hearing, Speech Sight Info:  Adequate Hearing Info: Adequate Speech Info: Adequate    SPECIAL CARE FACTORS FREQUENCY  PT (By licensed PT), OT (By licensed OT)     PT Frequency: 5x OT Frequency: 5x            Contractures Contractures Info: Not present    Additional Factors Info  Code Status, Allergies Code Status Info: DNR Allergies Info: metformin and related           Current Medications (10/02/2020):  This is the current hospital active medication list Current Facility-Administered Medications  Medication Dose Route Frequency Provider Last Rate Last Admin   acetaminophen (TYLENOL) tablet 650 mg  650 mg Oral Q6H PRN Ivor Costa, MD   650 mg at 10/01/20 2155   acidophilus (RISAQUAD) capsule 1 capsule  1 capsule Oral Daily Ivor Costa, MD   1 capsule at 10/02/20 0818   albuterol (PROVENTIL) (2.5 MG/3ML) 0.083% nebulizer solution 2.5 mg  2.5 mg Nebulization Q4H PRN Ivor Costa, MD   2.5 mg at 10/02/20 0149   ALPRAZolam (XANAX) tablet 0.25 mg  0.25 mg Oral TID PRN Ivor Costa, MD   0.25 mg at 10/02/20 1102   alum & mag hydroxide-simeth (MAALOX/MYLANTA) 200-200-20 MG/5ML suspension 30 mL  30 mL Oral Q4H PRN Ivor Costa, MD       apixaban Arne Cleveland) tablet 5 mg  5 mg Oral BID Ivor Costa, MD   5 mg at 10/02/20 3149   B-complex with vitamin C tablet 1 tablet  1 tablet Oral q morning Ivor Costa, MD   1 tablet at 10/02/20 1102   bisacodyl (DULCOLAX) suppository 10 mg  10 mg Rectal Daily PRN Ivor Costa, MD       dextromethorphan-guaiFENesin (Douglas DM) 30-600 MG per 12 hr tablet 1 tablet  1 tablet Oral BID PRN Ivor Costa, MD       diclofenac sodium (VOLTAREN) 1 % transdermal gel 4 g  4 g Topical TID Ivor Costa, MD   4 g at 10/01/20 2146   diltiazem (CARDIZEM CD) 24 hr capsule 120 mg  120 mg Oral Daily Ivor Costa, MD   120 mg at 10/02/20 0818   feeding supplement (ENSURE ENLIVE / ENSURE PLUS) liquid 237 mL  237 mL Oral q morning Ivor Costa, MD   237 mL at 10/02/20 0820   fluticasone (FLONASE) 50 MCG/ACT nasal spray 1  spray  1 spray Each Nare Daily PRN Ivor Costa, MD       furosemide (LASIX) injection 40 mg  40 mg Intravenous Huntley Dec, MD   40 mg at 10/02/20 7026   hydrALAZINE (APRESOLINE) injection 5 mg  5 mg Intravenous Q2H PRN Ivor Costa, MD       hydrOXYzine (ATARAX/VISTARIL) tablet 10 mg  10 mg Oral TID PRN Ivor Costa, MD       insulin aspart (novoLOG) injection 0-5 Units  0-5 Units Subcutaneous QHS Ivor Costa, MD   2 Units at 10/01/20 2141   insulin aspart (novoLOG) injection 0-9 Units  0-9 Units Subcutaneous TID WC Ivor Costa, MD   3 Units at 10/02/20 1151   loratadine (CLARITIN) tablet 10 mg  10 mg Oral Daily PRN Ivor Costa, MD       melatonin tablet 5 mg  5 mg Oral QHS Ivor Costa, MD   5 mg at 10/01/20 2141   metoprolol tartrate (LOPRESSOR) tablet 50 mg  50 mg Oral BID Ivor Costa, MD   50 mg at 10/02/20 0818   multivitamin-lutein (OCUVITE-LUTEIN) capsule 1 capsule  1 capsule Oral q morning Ivor Costa, MD   1 capsule at 10/02/20 1102   nitroGLYCERIN (NITROSTAT) SL tablet 0.4 mg  0.4 mg Sublingual Q5 min PRN Ivor Costa, MD       oxyCODONE (Oxy IR/ROXICODONE) immediate release tablet 2.5 mg  2.5 mg Oral Q8H PRN Ivor Costa, MD       pantoprazole (PROTONIX) EC tablet 40 mg  40 mg Oral BID Ivor Costa, MD   40 mg at 10/02/20 0819   polyethylene glycol (MIRALAX / GLYCOLAX) packet 17 g  17 g Oral Daily Ivor Costa, MD   17 g at 10/02/20 0818   pravastatin (PRAVACHOL) tablet 20 mg  20 mg Oral QHS Ivor Costa, MD   20 mg at 10/01/20 2141   sertraline (ZOLOFT) tablet 100 mg  100 mg Oral Daily Ivor Costa, MD   100 mg at 10/02/20 8453   traZODone (DESYREL) tablet 25 mg  25 mg Oral QHS PRN Mansy, Arvella Merles, MD       vitamin E capsule 400 Units  400 Units Oral q morning Ivor Costa, MD   400 Units at 10/02/20 1102     Discharge Medications: Please see discharge summary for a list of discharge medications.  Relevant Imaging Results:  Relevant Lab Results:   Additional Information MIW:[id number]  Gerrianne Scale  Woodford Strege, LCSW

## 2020-10-02 NOTE — Progress Notes (Signed)
*  PRELIMINARY RESULTS* Echocardiogram 2D Echocardiogram has been performed.  [name] 10/02/2020, 9:38 AM

## 2020-10-02 NOTE — Progress Notes (Signed)
Nutrition Brief Note  Patient identified on the Malnutrition Screening Tool (MST) Report  Wt Readings from Last 15 Encounters:  10/02/20 63.1 kg  09/22/20 62.1 kg  09/15/20 62.1 kg  09/07/20 62.3 kg  08/28/20 62.4 kg  07/29/20 63.2 kg  06/30/20 60.3 kg  06/10/20 63.7 kg  05/27/20 63.2 kg  05/07/20 62.4 kg  04/27/20 61.9 kg  02/26/20 63.1 kg  12/25/19 63.3 kg  12/24/19 63.1 kg  11/05/19 63.2 kg   Pt asleep during RD visit - did not awaken to sound or touch.  Pt's weight stable. No weight loss indicated above.  No depletions that are not age related on exam. Noted to have mild BLE edema.  Body mass index is 33.54 kg/m. Patient meets criteria for Obesity Class 1 based on current BMI.   Current diet order is 2 gram sodium restriction, patient is consuming approximately 50-100% of meals at this time. Labs and medications reviewed.   No nutrition interventions warranted at this time. If nutrition issues arise, please consult RD.   Derrel Nip, RD, LDN (she/her/hers) Registered Dietitian I After-Hours/Weekend Pager # in [place]

## 2020-10-02 NOTE — Progress Notes (Signed)
Pt's HR 120's/scheduled meds given. MD made aware. Will continue to monitor.

## 2020-10-02 NOTE — Consult Note (Signed)
   Heart Failure Nurse Navigator Note'  HFpEF 50-55%.  Moderately reduced right ventricular systolic function.  Moderately elevated pulmonary artery systolic pressure.  Mild tricuspid regurgitation.  Comorbidities:  Coronary artery disease with coronary artery bypass grafting Hypertension Hyperlipidemia Diabetes Chronic atrial fibrillation Depression/anxiety  Labs:  Sodium 140, potassium 3.6, chloride 95, CO2 38, BUN 19, creatinine 0.66, magnesium 1.4 Intake 240 mL Output 3450 mL Weight 63.1 kg  Medications:  Eliquis 5 mg 2 times a day Diltiazem 120 mg daily Furosemide 40 mg IV every 12 Metoprolol tartrate 50 mg 2 times a day Pravachol 20 mg daily  Assessment:  General-she is awake and alert lying in bed in no acute distress.  HEENT-pupils are equal, nonicteric.  Cardiac-heart tones of irregular rate and rhythm, no murmurs or gallops appreciated.   Abdomen- soft nontender.  Lower extremities-there is no edema noted.   Met with patient today to discuss heart failure.  She states that she does not use salt at the table and has not for many years due to her high having high blood pressure.  She states that she does not drink any more than eight 8 ounce cups of juice or water throughout the day.  She states due to her feeling anxious she does not talk any further about heart failure but she would like that that educational information be left for her daughter.  Instructed would be glad to come back anytime when she felt that she or her daughter wanted to talk.   Pricilla Riffle RN [facility]

## 2020-10-02 NOTE — TOC Initial Note (Signed)
Transition of Care [facility]) - Initial/Assessment Note    Patient Details  Name: [name] MRN: [id number] Date of Birth: 10-24-1927  Transition of Care [facility]) CM/SW Contact:    Eileen Stanford, LCSW Phone Number: 10/02/2020, 1:27 PM  Clinical Narrative:  CSW spoke with pt and pt suggest CSW call her daughter. CSW spoke with Cyndi pt's daughter and she states pt has been at [facility] for about a week and stated they did a bed hold so pt should be able to return to [facility] at d/c. While pt has been at [facility] they transport her to doctor appointments and fill all her prescriptions. Pt's daughter states that before [facility] pt had a aid that took her to doctor appointments. Pt's daughter states pt still sees Dr.Gutierrez and gets her scripts at the [facility] in [place].                 Expected Discharge Plan: Skilled Nursing Facility Barriers to Discharge: Continued Medical Work up   Patient Goals and CMS Choice Patient states their goals for this hospitalization and ongoing recovery are:: to get better   Choice offered to / list presented to : Patient, Adult Children  Expected Discharge Plan and Services Expected Discharge Plan: [place] In-house Referral: Clinical Social Work   Post Acute Care Choice: [place] Living arrangements for the past 2 months: [place], [place]                                      Prior Living Arrangements/Services Living arrangements for the past 2 months: [place], [place] Lives with:: Facility Resident Patient language and need for interpreter reviewed:: Yes Do you feel safe going back to the place where you live?: Yes      Need for Family Participation in Patient Care: Yes (Comment) Care giver support system in place?: Yes (comment)   Criminal Activity/Legal Involvement Pertinent to Current Situation/Hospitalization: No -  Comment as needed  Activities of Daily Living Home Assistive Devices/Equipment: Environmental consultant (specify type) ADL Screening (condition at time of admission) Patient's cognitive ability adequate to safely complete daily activities?: Yes Is the patient deaf or have difficulty hearing?: Yes Does the patient have difficulty seeing, even when wearing glasses/contacts?: No Does the patient have difficulty concentrating, remembering, or making decisions?: No Patient able to express need for assistance with ADLs?: Yes Does the patient have difficulty dressing or bathing?: Yes Independently performs ADLs?: No Does the patient have difficulty walking or climbing stairs?: Yes Weakness of Legs: Right Weakness of Arms/Hands: None  Permission Sought/Granted Permission sought to share information with : Family Supports Permission granted to share information with : Yes, Verbal Permission Granted  Share Information with NAME: Cyndi  Permission granted to share info w AGENCY: Dietitian granted to share info w Relationship: daughter     Emotional Assessment Appearance:: Appears stated age Attitude/Demeanor/Rapport: Engaged Affect (typically observed): Accepting Orientation: : Oriented to Self, Oriented to Place, Oriented to  Time, Oriented to Situation Alcohol / Substance Use: Not Applicable Psych Involvement: No (comment)  Admission diagnosis:  Shortness of breath [R06.02] Respiratory acidosis [E87.2] Acute CHF (congestive heart failure) (HCC) [I50.9] Acute respiratory failure with hypoxia (HCC) [J96.01] Acute on chronic diastolic CHF (congestive heart failure) (HCC) [I50.33] Acute on chronic congestive heart failure, unspecified heart failure type ([place]) [I50.9] Patient  Active Problem List   Diagnosis Date Noted   Depression with anxiety 10/01/2020   DVT (deep venous thrombosis) ([place]) 10/01/2020   Acute on chronic diastolic CHF (congestive heart failure) ([place]) 10/01/2020   Atrial  fibrillation, chronic ([place]) 09/22/2020   Cerebral atrophy ([place]) 09/22/2020   Atherosclerosis of aorta ([place]) 09/22/2020   Osteoporosis 09/22/2020   Bilirubinemia 09/22/2020   Leukocytosis 09/22/2020   Pyuria 09/22/2020   History of recurrent UTIs 09/22/2020   Hearing loss 09/22/2020   Chronic respiratory failure with hypoxia, on home O2 therapy ([place]) 09/22/2020   SIRS (systemic inflammatory response syndrome) (HCC)    Altered mental status, unspecified 09/21/2020   Postoperative anemia due to acute blood loss 09/13/2020   Displaced intertrochanteric fracture of right femur, initial encounter for closed fracture ([place]) 09/05/2020   Hip fracture due to osteoporosis with routine healing 09/05/2020   Financial difficulty 02/26/2020   UTI (urinary tract infection) 10/01/2019   CKD stage 3 due to type 2 diabetes mellitus ([place]) 09/02/2019   History of CVA in adulthood 08/23/2019   Polypharmacy 07/27/2019   E coli bacteremia 02/13/2019   Dyspnea    Acute on chronic respiratory failure with hypoxia and hypercapnia (HCC)    Chronic leg pain 11/14/2018   Atherosclerosis of native arteries of the extremities with ulceration ([place]) 08/21/2018   Chronic constipation 06/18/2018   Advanced directives, counseling/discussion 01/29/2018   Bilateral hearing loss 01/29/2018   Spinal stenosis of lumbosacral region 11/65/7903   Systolic murmur 83/33/8329   De Quervain's tenosynovitis, left 01/09/2017   Encounter for chronic pain management 02/09/2016   Paroxysmal atrial fibrillation (HCC)    Varicose veins of left lower extremity with inflammation, with ulcer of calf limited to breakdown of skin ([place]) 05/22/2015   Varicose veins of right lower extremity with inflammation 01/23/2015   Diabetes mellitus type 2 with peripheral artery disease (HCC)    Chronic venous insufficiency    Bleeding from varicose veins of lower extremity, left 02/13/2014   Diabetic polyneuropathy ([place]) 11/19/2013   Right shoulder  pain 08/19/2013   Abdominal pain 05/14/2012   Unsteady gait 12/13/2011   Chronic lower back pain 09/14/2011   Hyperlipidemia associated with type 2 diabetes mellitus ([place]) 09/06/2010   GAD (generalized anxiety disorder) 03/15/2010   Obesity, Class I, BMI 30-34.9 03/15/2010   Hypertension associated with type 2 diabetes mellitus ([place]) 03/15/2010   (HFpEF) heart failure with preserved ejection fraction ([place]) 03/15/2010   Coronary artery disease    PCP:  Ria Bush, MD Pharmacy:   [facility] [address] [place] [address] Phone: [phone] Fax: [phone]  [facility] - [place], [place] - [address] [place] [address] Phone: [phone] Fax: [phone]     Social Determinants of Health ([place]) Interventions    Readmission Risk Interventions Readmission Risk Prevention Plan 10/02/2020 09/04/2019  Transportation Screening Complete Complete  PCP or Specialist Appt within 3-5 Days Complete Complete  HRI or [place] Complete Complete  Social Work Consult for [place] Planning/Counseling Complete -  Palliative Care Screening Not Applicable Not Applicable  Medication Review Press photographer) Complete Complete  Some recent data might be hidden

## 2020-10-03 LAB — BASIC METABOLIC PANEL
Anion gap: 8 (ref 5–15)
BUN: 22 mg/dL (ref 8–23)
CO2: 39 mmol/L — ABNORMAL HIGH (ref 22–32)
Calcium: 8.7 mg/dL — ABNORMAL LOW (ref 8.9–10.3)
Chloride: 92 mmol/L — ABNORMAL LOW (ref 98–111)
Creatinine, Ser: 0.64 mg/dL (ref 0.44–1.00)
GFR, Estimated: >60 mL/min (ref >60)
Glucose, Bld: 154 mg/dL — ABNORMAL HIGH (ref 70–99)
Potassium: 3.6 mmol/L (ref 3.5–5.1)
Sodium: 139 mmol/L (ref 135–145)

## 2020-10-03 LAB — MAGNESIUM: Magnesium: 2 mg/dL (ref 1.7–2.4)

## 2020-10-03 LAB — GLUCOSE, CAPILLARY
Glucose-Capillary: 173 mg/dL — ABNORMAL HIGH (ref 70–99)
Glucose-Capillary: 294 mg/dL — ABNORMAL HIGH (ref 70–99)
Glucose-Capillary: 116 mg/dL — ABNORMAL HIGH (ref 70–99)
Glucose-Capillary: 165 mg/dL — ABNORMAL HIGH (ref 70–99)

## 2020-10-03 NOTE — Progress Notes (Signed)
PROGRESS NOTE    [name]  FUX:[id number] DOB: 16-Dec-1927 DOA: 10/01/2020 PCP: Ria Bush, MD   Chief complaint for shortness of breath. Brief Narrative:  [name] is a 87 y.o. female with medical history significant of dCHF, on 2 L oxygen at home, hypertension, hyperlipidemia, diabetes mellitus, stroke, GERD, depression, anxiety, atrial fibrillation and DVT on Eliquis, varicose vein, peptic ulcer disease, duodenal ulcer with perforation, PAD, anemia, UTI, diverticulitis, CAD, CABG, CKD stage III, who presents with shortness of breath.  She also developed a worsening hypoxemia. CT chest did not show any evidence of pulmonary emboli.  She is placed on IV Lasix for acute on chronic diastolic congestive heart failure.   Assessment & Plan:   Principal Problem:   Acute on chronic diastolic CHF (congestive heart failure) (HCC) Active Problems:   Coronary artery disease   Hypertension associated with type 2 diabetes mellitus (HCC)   Hyperlipidemia associated with type 2 diabetes mellitus (HCC)   Diabetes mellitus type 2 with peripheral artery disease (HCC)   Acute on chronic respiratory failure with hypoxia and hypercapnia (HCC)   History of CVA in adulthood   Atrial fibrillation, chronic (HCC)   Depression with anxiety   DVT (deep venous thrombosis) ([place])  #1.  Acute on chronic respiratory failure with hypoxemia and hypercapnia. Acute on chronic diastolic congestive heart failure. Coronary artery disease. Chronic atrial fibrillation. Condition improving, still on 3 L oxygen, will wean oxygen.  Continue IV Lasix.  Continue Eliquis for stroke prevention.  2.  Uncontrolled type 2 diabetes with hyperglycemia. Continue sliding scale insulin.  3.  History of DVT. History of stroke. Continue Eliquis.    DVT prophylaxis: Eliquis Code Status: DNR Family Communication:  Disposition Plan:    Status is: Inpatient  Remains inpatient appropriate because:IV  treatments appropriate due to intensity of illness or inability to take PO and Inpatient level of care appropriate due to severity of illness  Dispo: The patient is from: SNF              Anticipated d/c is to: SNF              Patient currently is not medically stable to d/c.   Difficult to place patient No        I/O last 3 completed shifts: In: 798.1 [P.O.:700; IV Piggyback:98.1] Out: 4450 [Urine:4450] Total I/O In: 360 [P.O.:360] Out: 700 [Urine:700]     Consultants:  None  Procedures: None  Antimicrobials: None   Subjective: Patient still on 3 L oxygen, but feels short of breath is better. Cough, nonproductive. No abdominal pain or nausea vomiting. No dysuria hematuria No fever or chills.  Objective: Vitals:   10/03/20 0500 10/03/20 0600 10/03/20 0819 10/03/20 1134  BP:  (!) 141/79 127/77 124/72  Pulse:  (!) 114 (!) 101 96  Resp:  16 16 20   Temp:   (!) 97.2 F (36.2 C) 98 F (36.7 C)  TempSrc:   Axillary Oral  SpO2:  98% 97% 97%  Weight: 60.6 kg     Height:        Intake/Output Summary (Last 24 hours) at 10/03/2020 1333 Last data filed at 10/03/2020 0944 Gross per 24 hour  Intake 458.09 ml  Output 2500 ml  Net -2041.91 ml   Filed Weights   10/01/20 0506 10/02/20 0713 10/03/20 0500  Weight: 62 kg 63.1 kg 60.6 kg    Examination:  General exam: Appears calm and comfortable  Respiratory system: Crackles in the base.  Respiratory effort normal. Cardiovascular system: Irregularly irregular.  No JVD, murmurs, rubs, gallops or clicks. No pedal edema. Gastrointestinal system: Abdomen is nondistended, soft and nontender. No organomegaly or masses felt. Normal bowel sounds heard. Central nervous system: Alert and oriented x3. No focal neurological deficits. Extremities: Symmetric 5 x 5 power. Skin: No rashes, lesions or ulcers Psychiatry:  Mood & affect appropriate.     Data Reviewed: I have personally reviewed following labs and imaging  studies  CBC: Recent Labs  Lab 10/01/20 0512  WBC 10.4  NEUTROABS 7.5  HGB 10.6*  HCT 35.2*  MCV 94.9  PLT 250   Basic Metabolic Panel: Recent Labs  Lab 10/01/20 0512 10/02/20 0429 10/03/20 0353  NA 138 140 139  K 4.5 3.6 3.6  CL 98 95* 92*  CO2 33* 38* 39*  GLUCOSE 172* 126* 154*  BUN 21 19 22   CREATININE 0.58 0.66 0.64  CALCIUM 9.2 8.7* 8.7*  MG 1.5* 1.4* 2.0   GFR: Estimated Creatinine Clearance: 31.3 mL/min (by C-G formula based on SCr of 0.64 mg/dL). Liver Function Tests: No results for input(s): AST, ALT, ALKPHOS, BILITOT, PROT, ALBUMIN in the last 168 hours. No results for input(s): LIPASE, AMYLASE in the last 168 hours. No results for input(s): AMMONIA in the last 168 hours. Coagulation Profile: No results for input(s): INR, PROTIME in the last 168 hours. Cardiac Enzymes: No results for input(s): CKTOTAL, CKMB, CKMBINDEX, TROPONINI in the last 168 hours. BNP (last 3 results) No results for input(s): PROBNP in the last 8760 hours. HbA1C: No results for input(s): HGBA1C in the last 72 hours. CBG: Recent Labs  Lab 10/02/20 1132 10/02/20 1622 10/02/20 2052 10/03/20 0819 10/03/20 1134  GLUCAP 227* 151* 150* 173* 294*   Lipid Profile: No results for input(s): CHOL, HDL, LDLCALC, TRIG, CHOLHDL, LDLDIRECT in the last 72 hours. Thyroid Function Tests: No results for input(s): TSH, T4TOTAL, [name]4, T3FREE, THYROIDAB in the last 72 hours. Anemia Panel: No results for input(s): VITAMINB12, FOLATE, FERRITIN, TIBC, IRON, RETICCTPCT in the last 72 hours. Sepsis Labs: Recent Labs  Lab 10/01/20 0512  PROCALCITON <0.10    Recent Results (from the past 240 hour(s))  SARS CORONAVIRUS 2 (TAT 6-24 HRS) Nasopharyngeal Nasopharyngeal Swab     Status: None   Collection Time: 09/24/20  2:25 PM   Specimen: Nasopharyngeal Swab  Result Value Ref Range Status   SARS Coronavirus 2 NEGATIVE NEGATIVE Final    Comment: (NOTE) SARS-CoV-2 target nucleic acids are NOT  DETECTED.  The SARS-CoV-2 RNA is generally detectable in upper and lower respiratory specimens during the acute phase of infection. Negative results do not preclude SARS-CoV-2 infection, do not rule out co-infections with other pathogens, and should not be used as the sole basis for treatment or other patient management decisions. Negative results must be combined with clinical observations, patient history, and epidemiological information. The expected result is Negative.  Fact Sheet for Patients: SugarRoll.be  Fact Sheet for Healthcare Providers: https://www.woods-mathews.com/  This test is not yet approved or cleared by the [place] FDA and  has been authorized for detection and/or diagnosis of SARS-CoV-2 by FDA under an Emergency Use Authorization (EUA). This EUA will remain  in effect (meaning this test can be used) for the duration of the COVID-19 declaration under Se ction 564(b)(1) of the Act, 21 U.S.C. section 360bbb-3(b)(1), unless the authorization is terminated or revoked sooner.  Performed at [facility], [place] [address] [place], [place] [address]   Resp Panel by RT-PCR (  Flu A&B, Covid) Nasopharyngeal Swab     Status: None   Collection Time: 10/01/20  5:12 AM   Specimen: Nasopharyngeal Swab; Nasopharyngeal(NP) swabs in vial transport medium  Result Value Ref Range Status   SARS Coronavirus 2 by RT PCR NEGATIVE NEGATIVE Final    Comment: (NOTE) SARS-CoV-2 target nucleic acids are NOT DETECTED.  The SARS-CoV-2 RNA is generally detectable in upper respiratory specimens during the acute phase of infection. The lowest concentration of SARS-CoV-2 viral copies this assay can detect is 138 copies/mL. A negative result does not preclude SARS-Cov-2 infection and should not be used as the sole basis for treatment or other patient management decisions. A negative result may occur with  improper specimen  collection/handling, submission of specimen other than nasopharyngeal swab, presence of viral mutation(s) within the areas targeted by this assay, and inadequate number of viral copies(<138 copies/mL). A negative result must be combined with clinical observations, patient history, and epidemiological information. The expected result is Negative.  Fact Sheet for Patients:  EntrepreneurPulse.com.au  Fact Sheet for Healthcare Providers:  IncredibleEmployment.be  This test is no t yet approved or cleared by the [place] FDA and  has been authorized for detection and/or diagnosis of SARS-CoV-2 by FDA under an Emergency Use Authorization (EUA). This EUA will remain  in effect (meaning this test can be used) for the duration of the COVID-19 declaration under Section 564(b)(1) of the Act, 21 U.S.C.section 360bbb-3(b)(1), unless the authorization is terminated  or revoked sooner.       Influenza A by PCR NEGATIVE NEGATIVE Final   Influenza B by PCR NEGATIVE NEGATIVE Final    Comment: (NOTE) The Xpert Xpress SARS-CoV-2/FLU/RSV plus assay is intended as an aid in the diagnosis of influenza from Nasopharyngeal swab specimens and should not be used as a sole basis for treatment. Nasal washings and aspirates are unacceptable for Xpert Xpress SARS-CoV-2/FLU/RSV testing.  Fact Sheet for Patients: EntrepreneurPulse.com.au  Fact Sheet for Healthcare Providers: IncredibleEmployment.be  This test is not yet approved or cleared by the [place] FDA and has been authorized for detection and/or diagnosis of SARS-CoV-2 by FDA under an Emergency Use Authorization (EUA). This EUA will remain in effect (meaning this test can be used) for the duration of the COVID-19 declaration under Section 564(b)(1) of the Act, 21 U.S.C. section 360bbb-3(b)(1), unless the authorization is terminated or revoked.  Performed at [facility], [address], [place], [place] [address]          Radiology Studies: ECHOCARDIOGRAM COMPLETE  Result Date: 10/02/2020    ECHOCARDIOGRAM REPORT   Patient Name:   KRISTIANE MORSCH Barrell Date of Exam: 10/02/2020 Medical Rec #:  [id number]        Height:       54.0 in Accession #:    [id number]       Weight:       139.1 lb Date of Birth:  04/30/1927         BSA:          1.482 m Patient Age:    45 years         BP:           119/76 mmHg Patient Gender: F                HR:           101 bpm. Exam Location:  [facility] Procedure: 2D Echo, Color Doppler and Cardiac Doppler Indications:     I50.31 CHF-Acute  Diastolic  History:         Patient has prior history of Echocardiogram examinations, most                  recent 09/02/2019. CHF, Previous Myocardial Infarction, Prior                  CABG; Risk Factors:Diabetes, Hypertension and Dyslipidemia.  Sonographer:     Charmayne Sheer RDCS (AE) Referring Phys:  Baker Janus Soledad Gerlach NIU Diagnosing Phys: Kathlyn Sacramento MD  Sonographer Comments: Suboptimal parasternal window and suboptimal subcostal window. IMPRESSIONS  1. Left ventricular ejection fraction, by estimation, is 40 to 45%. The left ventricle has mildly decreased function. Left ventricular endocardial border not optimally defined to evaluate regional wall motion. Left ventricular diastolic parameters are indeterminate.  2. Right ventricular systolic function is moderately reduced. The right ventricular size is mildly enlarged. There is moderately elevated pulmonary artery systolic pressure. The estimated right ventricular systolic pressure is 45.6 mmHg.  3. Left atrial size was moderately dilated.  4. The mitral valve is normal in structure. Moderate mitral valve regurgitation. No evidence of mitral stenosis. Severe mitral annular calcification.  5. Tricuspid valve regurgitation is moderate.  6. The aortic valve is normal in structure. Aortic valve regurgitation is not visualized. Mild to moderate aortic valve  sclerosis/calcification is present, without any evidence of aortic stenosis.  7. The inferior vena cava is dilated in size with <50% respiratory variability, suggesting right atrial pressure of 15 mmHg. FINDINGS  Left Ventricle: Left ventricular ejection fraction, by estimation, is 40 to 45%. The left ventricle has mildly decreased function. Left ventricular endocardial border not optimally defined to evaluate regional wall motion. The left ventricular internal cavity size was normal in size. There is no left ventricular hypertrophy. Left ventricular diastolic parameters are indeterminate. Right Ventricle: The right ventricular size is mildly enlarged. No increase in right ventricular wall thickness. Right ventricular systolic function is moderately reduced. There is moderately elevated pulmonary artery systolic pressure. The tricuspid regurgitant velocity is 3.07 m/s, and with an assumed right atrial pressure of 15 mmHg, the estimated right ventricular systolic pressure is 25.6 mmHg. Left Atrium: Left atrial size was moderately dilated. Right Atrium: Right atrial size was normal in size. Pericardium: There is no evidence of pericardial effusion. Mitral Valve: The mitral valve is normal in structure. Severe mitral annular calcification. Moderate mitral valve regurgitation. No evidence of mitral valve stenosis. MV peak gradient, 6.2 mmHg. The mean mitral valve gradient is 2.0 mmHg. Tricuspid Valve: The tricuspid valve is normal in structure. Tricuspid valve regurgitation is moderate . No evidence of tricuspid stenosis. Aortic Valve: The aortic valve is normal in structure. Aortic valve regurgitation is not visualized. Mild to moderate aortic valve sclerosis/calcification is present, without any evidence of aortic stenosis. Aortic valve mean gradient measures 4.0 mmHg. Aortic valve peak gradient measures 7.8 mmHg. Aortic valve area, by VTI measures 1.24 cm. Pulmonic Valve: The pulmonic valve was normal in structure.  Pulmonic valve regurgitation is not visualized. No evidence of pulmonic stenosis. Aorta: The aortic root is normal in size and structure. Venous: The inferior vena cava is dilated in size with less than 50% respiratory variability, suggesting right atrial pressure of 15 mmHg. IAS/Shunts: No atrial level shunt detected by color flow Doppler.  LEFT VENTRICLE PLAX 2D LVIDd:         3.90 cm  Diastology LVIDs:         2.80 cm  LV e' lateral:   6.74 cm/s  LV PW:         1.00 cm  LV E/e' lateral: 15.6 LV IVS:        0.80 cm LVOT diam:     1.90 cm LV SV:         31 LV SV Index:   21 LVOT Area:     2.84 cm  RIGHT VENTRICLE RV Basal diam:  3.30 cm LEFT ATRIUM             Index       RIGHT ATRIUM           Index LA diam:        4.40 cm 2.97 cm/m  RA Area:     14.60 cm LA Vol (A2C):   72.7 ml 49.04 ml/m RA Volume:   31.40 ml  21.18 ml/m LA Vol (A4C):   66.1 ml 44.59 ml/m LA Biplane Vol: 69.7 ml 47.02 ml/m  AORTIC VALVE                   PULMONIC VALVE AV Area (Vmax):    1.22 cm    PV Vmax:       0.79 m/s AV Area (Vmean):   1.22 cm    PV Vmean:      55.700 cm/s AV Area (VTI):     1.24 cm    PV VTI:        0.121 m AV Vmax:           140.00 cm/s PV Peak grad:  2.5 mmHg AV Vmean:          98.900 cm/s PV Mean grad:  1.0 mmHg AV VTI:            0.250 m AV Peak Grad:      7.8 mmHg AV Mean Grad:      4.0 mmHg LVOT Vmax:         60.10 cm/s LVOT Vmean:        42.600 cm/s LVOT VTI:          0.109 m LVOT/AV VTI ratio: 0.44  AORTA Ao Root diam: 2.70 cm MITRAL VALVE                TRICUSPID VALVE MV Area (PHT): 5.29 cm     TR Peak grad:   37.7 mmHg MV Area VTI:   1.04 cm     TR Vmax:        307.00 cm/s MV Peak grad:  6.2 mmHg MV Mean grad:  2.0 mmHg     SHUNTS MV Vmax:       1.24 m/s     Systemic VTI:  0.11 m MV Vmean:      60.4 cm/s    Systemic Diam: 1.90 cm MV Decel Time: 143 msec MV E velocity: 105.00 cm/s Kathlyn Sacramento MD Electronically signed by Kathlyn Sacramento MD Signature Date/Time: 10/02/2020/5:32:47 PM    Final          Scheduled Meds:  acidophilus  1 capsule Oral Daily   apixaban  5 mg Oral BID   B-complex with vitamin C  1 tablet Oral q morning   diclofenac sodium  4 g Topical TID   diltiazem  120 mg Oral Daily   feeding supplement  237 mL Oral q morning   furosemide  40 mg Intravenous Q12H   insulin aspart  0-5 Units Subcutaneous QHS   insulin aspart  0-9 Units Subcutaneous TID WC   melatonin  5  mg Oral QHS   metoprolol tartrate  50 mg Oral BID   multivitamin-lutein  1 capsule Oral q morning   pantoprazole  40 mg Oral BID   polyethylene glycol  17 g Oral Daily   pravastatin  20 mg Oral QHS   sertraline  100 mg Oral Daily   vitamin E  400 Units Oral q morning   Continuous Infusions:   LOS: 2 days    Time spent: 28 minutes    Sharen Hones, MD [facility]   To contact the attending provider between 7A-7P or the covering provider during after hours 7P-7A, please log into the web site [email or web address] and access using universal [place] password for that web site. If you do not have the password, please call the hospital operator.  10/03/2020, 1:33 PM

## 2020-10-03 NOTE — Progress Notes (Addendum)
   10/03/20 1959  Assess: MEWS Score  Temp 97.9 F (36.6 C)  BP 104/64  Pulse Rate (!) 118  Resp 18  Level of Consciousness Alert  SpO2 95 %  O2 Device Nasal Cannula  Patient Activity (if Appropriate) In bed  O2 Flow Rate (L/min) 3 L/min  Assess: MEWS Score  MEWS Temp 0  MEWS Systolic 0  MEWS Pulse 2  MEWS RR 0  MEWS LOC 0  MEWS Score 2  MEWS Score Color Yellow  Assess: if the MEWS score is Yellow or Red  Were vital signs taken at a resting state? Yes  Focused Assessment No change from prior assessment  Does the patient meet 2 or more of the SIRS criteria? Yes  Does the patient have a confirmed or suspected source of infection? No  Early Detection of Sepsis Score *See Row Information* Low  MEWS guidelines implemented *See Row Information* No, other (Comment) (patient has PO meds due to control HR. Will administer and recheck.)  Treat  MEWS Interventions Administered scheduled meds/treatments  Pain Scale 0-10  Pain Score 3  Pain Type Chronic pain  Pain Location Hip  Pain Orientation Right  Pain Radiating Towards none  Pain Descriptors / Indicators Aching  Pain Frequency Intermittent  Pain Onset On-going  Patients Stated Pain Goal 0  Pain Intervention(s) Medication (See eMAR)  Multiple Pain Sites No  Breathing 0  Negative Vocalization 0  Facial Expression 0  Body Language 0  Consolability 0  PAINAD Score 0  Take Vital Signs  Increase Vital Sign Frequency  Yellow: Q 2hr X 2 then Q 4hr X 2, if remains yellow, continue Q 4hrs  Escalate  MEWS: Escalate Yellow: discuss with charge nurse/RN and consider discussing with provider and RRT  Notify: Charge Nurse/RN  Name of Charge Nurse/RN Notified Sharyn Lull  Date Charge Nurse/RN Notified 10/03/20  Time Charge Nurse/RN Notified 2030  Document  Patient Outcome Stabilized after interventions  Progress note created (see row info) Yes  Assess: SIRS CRITERIA  SIRS Temperature  0  SIRS Pulse 1  SIRS Respirations  0  SIRS  WBC 0  SIRS Score Sum  1    0013 Notified Dr. Sidney Ace patient's HR staying 90-120. New order received, see MAR. Will continue to monitor.

## 2020-10-04 LAB — BASIC METABOLIC PANEL
Anion gap: 10 (ref 5–15)
BUN: 25 mg/dL — ABNORMAL HIGH (ref 8–23)
CO2: 39 mmol/L — ABNORMAL HIGH (ref 22–32)
Calcium: 8.9 mg/dL (ref 8.9–10.3)
Chloride: 90 mmol/L — ABNORMAL LOW (ref 98–111)
Creatinine, Ser: 0.64 mg/dL (ref 0.44–1.00)
GFR, Estimated: >60 mL/min (ref >60)
Glucose, Bld: 157 mg/dL — ABNORMAL HIGH (ref 70–99)
Potassium: 3.9 mmol/L (ref 3.5–5.1)
Sodium: 139 mmol/L (ref 135–145)

## 2020-10-04 LAB — GLUCOSE, CAPILLARY
Glucose-Capillary: 167 mg/dL — ABNORMAL HIGH (ref 70–99)
Glucose-Capillary: 186 mg/dL — ABNORMAL HIGH (ref 70–99)
Glucose-Capillary: 179 mg/dL — ABNORMAL HIGH (ref 70–99)
Glucose-Capillary: 200 mg/dL — ABNORMAL HIGH (ref 70–99)

## 2020-10-04 LAB — MAGNESIUM: Magnesium: 1.6 mg/dL — ABNORMAL LOW (ref 1.7–2.4)

## 2020-10-04 MED ORDER — DIGOXIN 0.25 MG/ML IJ SOLN
0.2500 mg | Freq: Once | INTRAMUSCULAR | Status: AC
  Administered 2020-10-04: 0.25 mg via INTRAVENOUS
  Filled 2020-10-04: qty 2

## 2020-10-04 MED ORDER — MAGNESIUM SULFATE 2 GM/50ML IV SOLN
2.0000 g | Freq: Once | INTRAVENOUS | Status: AC
  Administered 2020-10-04: 2 g via INTRAVENOUS
  Filled 2020-10-04: qty 50

## 2020-10-04 NOTE — TOC Progression Note (Addendum)
Transition of Care [facility]) - Progression Note    Patient Details  Name: ARIAL GALLIGAN MRN: [id number] Date of Birth: [birth year]  Transition of Care [facility] [place]) CM/SW [place], JAARS Phone Number: 10/04/2020, 9:03 AM  Clinical Narrative:     Update: Due to staffing and holiday weekend, Magda Paganini with [facility] reports patient cannot discharge back to facility until Tuesday 7/5. MD updated.    CSW reached out to [place] with [facility] to inquire if patient can discharge back today, pending response at this time. MD reports patient medically stable to dc back to facility.   Expected Discharge Plan: [place] Barriers to Discharge: Continued Medical Work up  Expected Discharge Plan and Services Expected Discharge Plan: [place] In-house Referral: Clinical Social Work   Post Acute Care Choice: [place] Living arrangements for the past 2 months: [place], [place]                                       Social Determinants of Health (SDOH) Interventions    Readmission Risk Interventions Readmission Risk Prevention Plan 10/02/2020 09/04/2019  Transportation Screening Complete Complete  PCP or Specialist Appt within 3-5 Days Complete Complete  HRI or Home Care Consult Complete Complete  Social Work Consult for [place] Planning/Counseling Complete -  Palliative Care Screening Not Applicable Not Applicable  Medication Review Press photographer) Complete Complete  Some recent data might be hidden

## 2020-10-04 NOTE — Plan of Care (Signed)
  Problem: Education: Goal: Knowledge of General Education information will improve Description: Including pain rating scale, medication(s)/side effects and non-pharmacologic comfort measures 10/04/2020 0109 by Larwance Rote, RN Outcome: Progressing 10/04/2020 0109 by Larwance Rote, RN Outcome: Progressing   Problem: Health Behavior/Discharge Planning: Goal: Ability to manage health-related needs will improve 10/04/2020 0109 by Larwance Rote, RN Outcome: Progressing 10/04/2020 0109 by Larwance Rote, RN Outcome: Progressing   Problem: Clinical Measurements: Goal: Ability to maintain clinical measurements within normal limits will improve 10/04/2020 0109 by Larwance Rote, RN Outcome: Progressing 10/04/2020 0109 by Larwance Rote, RN Outcome: Progressing Goal: Will remain free from infection 10/04/2020 0109 by Larwance Rote, RN Outcome: Progressing 10/04/2020 0109 by Larwance Rote, RN Outcome: Progressing Goal: Diagnostic test results will improve 10/04/2020 0109 by Larwance Rote, RN Outcome: Progressing 10/04/2020 0109 by Larwance Rote, RN Outcome: Progressing Goal: Respiratory complications will improve 10/04/2020 0109 by Larwance Rote, RN Outcome: Progressing 10/04/2020 0109 by Larwance Rote, RN Outcome: Progressing Goal: Cardiovascular complication will be avoided Outcome: Progressing   Problem: Activity: Goal: Risk for activity intolerance will decrease Outcome: Progressing   Problem: Nutrition: Goal: Adequate nutrition will be maintained Outcome: Progressing   Problem: Coping: Goal: Level of anxiety will decrease Outcome: Progressing   Problem: Elimination: Goal: Will not experience complications related to bowel motility Outcome: Progressing Goal: Will not experience complications related to urinary retention Outcome: Progressing   Problem: Pain Managment: Goal: General experience of comfort will improve Outcome: Progressing   Problem: Safety: Goal: Ability to  remain free from injury will improve Outcome: Progressing   Problem: Skin Integrity: Goal: Risk for impaired skin integrity will decrease Outcome: Progressing   Problem: Education: Goal: Ability to demonstrate management of disease process will improve Outcome: Progressing Goal: Ability to verbalize understanding of medication therapies will improve Outcome: Progressing Goal: Individualized Educational Video(s) Outcome: Progressing   Problem: Activity: Goal: Capacity to carry out activities will improve Outcome: Progressing   Problem: Cardiac: Goal: Ability to achieve and maintain adequate cardiopulmonary perfusion will improve Outcome: Progressing

## 2020-10-04 NOTE — Progress Notes (Signed)
PROGRESS NOTE    [name]  DVV:[id number] DOB: 26-Dec-1927 DOA: 10/01/2020 PCP: Ria Bush, MD   Chief complaint: shortness of breath. Brief Narrative:  [name] is a 87 y.o. female with medical history significant of dCHF, on 2 L oxygen at home, hypertension, hyperlipidemia, diabetes mellitus, stroke, GERD, depression, anxiety, atrial fibrillation and DVT on Eliquis, varicose vein, peptic ulcer disease, duodenal ulcer with perforation, PAD, anemia, UTI, diverticulitis, CAD, CABG, CKD stage III, who presents with shortness of breath.  She also developed a worsening hypoxemia. CT chest did not show any evidence of pulmonary emboli.  She is placed on IV Lasix for acute on chronic diastolic congestive heart failure.   Assessment & Plan:   Principal Problem:   Acute on chronic diastolic CHF (congestive heart failure) (HCC) Active Problems:   Coronary artery disease   Hypertension associated with type 2 diabetes mellitus (HCC)   Hyperlipidemia associated with type 2 diabetes mellitus (HCC)   Diabetes mellitus type 2 with peripheral artery disease (HCC)   Acute on chronic respiratory failure with hypoxia and hypercapnia (HCC)   History of CVA in adulthood   Atrial fibrillation, chronic (HCC)   Depression with anxiety   DVT (deep venous thrombosis) ([place])   Hypomagnesemia  #1.  Acute on chronic respiratory failure with hypoxemia and hypercapnia. Acute on chronic diastolic congestive heart failure. Coronary artery disease. Chronic atrial fibrillation. Condition gradually improving.  Patient cannot be transferred back to nursing home today due to staffing issues.  I will keep her for another 2 days until Tuesday when SNF is ready.  At the meantime, she will benefit with additional IV Lasix. Continue Eliquis for stroke prevention.  2.  Uncontrolled type 2 diabetes with hyperglycemia.  Continue sliding scale insulin.  3.  History of DVT. History of stroke. Continue  Eliquis.  #4.  Hypomagnesemia. Repleted.   DVT prophylaxis: Eliquis Code Status: DNR Family Communication:  Disposition Plan:    Status is: Inpatient  Remains inpatient appropriate because:Inpatient level of care appropriate due to severity of illness  Dispo: The patient is from: SNF              Anticipated d/c is to: SNF              Patient currently is medically stable to d/c.   Difficult to place patient No        I/O last 3 completed shifts: In: 840 [P.O.:840] Out: 4000 [Urine:4000] Total I/O In: 360 [P.O.:360] Out: 800 [Urine:800]     Consultants:  None  Procedures: None  Antimicrobials: None   Subjective: Patient feels much improved.  Short of breath is better.  Still on 3 L oxygen, cough, nonproductive for No fever or chills. No abdominal pain or nausea vomiting. No dysuria hematuria.  Objective: Vitals:   10/04/20 0514 10/04/20 0635 10/04/20 0649 10/04/20 0719  BP: 109/76 120/75  132/70  Pulse: 92 71  (!) 105  Resp: 16 14  16   Temp: 97.6 F (36.4 C)   98.2 F (36.8 C)  TempSrc: Axillary   Oral  SpO2: 97% 95% 95% 96%  Weight:      Height:        Intake/Output Summary (Last 24 hours) at 10/04/2020 0940 Last data filed at 10/04/2020 0800 Gross per 24 hour  Intake 1200 ml  Output 3000 ml  Net -1800 ml   Filed Weights   10/02/20 0713 10/03/20 0500 10/04/20 0050  Weight: 63.1 kg 60.6 kg 61.5 kg  Examination:  General exam: Appears calm and comfortable  Respiratory system: Crackles in the base bilaterally.  Respiratory effort normal. Cardiovascular system: Irregular, no JVD, murmurs, rubs, gallops or clicks. No pedal edema. Gastrointestinal system: Abdomen is nondistended, soft and nontender. No organomegaly or masses felt. Normal bowel sounds heard. Central nervous system: Alert and oriented x2. No focal neurological deficits. Extremities: Symmetric 5 x 5 power. Skin: No rashes, lesions or ulcers Psychiatry: Judgement and insight  appear normal. Mood & affect appropriate.     Data Reviewed: I have personally reviewed following labs and imaging studies  CBC: Recent Labs  Lab 10/01/20 0512  WBC 10.4  NEUTROABS 7.5  HGB 10.6*  HCT 35.2*  MCV 94.9  PLT 542   Basic Metabolic Panel: Recent Labs  Lab 10/01/20 0512 10/02/20 0429 10/03/20 0353 10/04/20 0553  NA 138 140 139 139  K 4.5 3.6 3.6 3.9  CL 98 95* 92* 90*  CO2 33* 38* 39* 39*  GLUCOSE 172* 126* 154* 157*  BUN 21 19 22  25*  CREATININE 0.58 0.66 0.64 0.64  CALCIUM 9.2 8.7* 8.7* 8.9  MG 1.5* 1.4* 2.0 1.6*   GFR: Estimated Creatinine Clearance: 31.5 mL/min (by C-G formula based on SCr of 0.64 mg/dL). Liver Function Tests: No results for input(s): AST, ALT, ALKPHOS, BILITOT, PROT, ALBUMIN in the last 168 hours. No results for input(s): LIPASE, AMYLASE in the last 168 hours. No results for input(s): AMMONIA in the last 168 hours. Coagulation Profile: No results for input(s): INR, PROTIME in the last 168 hours. Cardiac Enzymes: No results for input(s): CKTOTAL, CKMB, CKMBINDEX, TROPONINI in the last 168 hours. BNP (last 3 results) No results for input(s): PROBNP in the last 8760 hours. HbA1C: No results for input(s): HGBA1C in the last 72 hours. CBG: Recent Labs  Lab 10/03/20 0819 10/03/20 1134 10/03/20 1554 10/03/20 2106 10/04/20 0725  GLUCAP 173* 294* 116* 165* 167*   Lipid Profile: No results for input(s): CHOL, HDL, LDLCALC, TRIG, CHOLHDL, LDLDIRECT in the last 72 hours. Thyroid Function Tests: No results for input(s): TSH, T4TOTAL, [name]4, T3FREE, THYROIDAB in the last 72 hours. Anemia Panel: No results for input(s): VITAMINB12, FOLATE, FERRITIN, TIBC, IRON, RETICCTPCT in the last 72 hours. Sepsis Labs: Recent Labs  Lab 10/01/20 0512  PROCALCITON <0.10    Recent Results (from the past 240 hour(s))  SARS CORONAVIRUS 2 (TAT 6-24 HRS) Nasopharyngeal Nasopharyngeal Swab     Status: None   Collection Time: 09/24/20  2:25 PM    Specimen: Nasopharyngeal Swab  Result Value Ref Range Status   SARS Coronavirus 2 NEGATIVE NEGATIVE Final    Comment: (NOTE) SARS-CoV-2 target nucleic acids are NOT DETECTED.  The SARS-CoV-2 RNA is generally detectable in upper and lower respiratory specimens during the acute phase of infection. Negative results do not preclude SARS-CoV-2 infection, do not rule out co-infections with other pathogens, and should not be used as the sole basis for treatment or other patient management decisions. Negative results must be combined with clinical observations, patient history, and epidemiological information. The expected result is Negative.  Fact Sheet for Patients: SugarRoll.be  Fact Sheet for Healthcare Providers: https://www.woods-mathews.com/  This test is not yet approved or cleared by the [place] FDA and  has been authorized for detection and/or diagnosis of SARS-CoV-2 by FDA under an Emergency Use Authorization (EUA). This EUA will remain  in effect (meaning this test can be used) for the duration of the COVID-19 declaration under Se ction 564(b)(1) of the Act, 21 U.S.C.  section 360bbb-3(b)(1), unless the authorization is terminated or revoked sooner.  Performed at [facility], [place] [address] [place], [place] [address]   Resp Panel by RT-PCR (Flu A&B, Covid) Nasopharyngeal Swab     Status: None   Collection Time: 10/01/20  5:12 AM   Specimen: Nasopharyngeal Swab; Nasopharyngeal(NP) swabs in vial transport medium  Result Value Ref Range Status   SARS Coronavirus 2 by RT PCR NEGATIVE NEGATIVE Final    Comment: (NOTE) SARS-CoV-2 target nucleic acids are NOT DETECTED.  The SARS-CoV-2 RNA is generally detectable in upper respiratory specimens during the acute phase of infection. The lowest concentration of SARS-CoV-2 viral copies this assay can detect is 138 copies/mL. A negative result does not preclude  SARS-Cov-2 infection and should not be used as the sole basis for treatment or other patient management decisions. A negative result may occur with  improper specimen collection/handling, submission of specimen other than nasopharyngeal swab, presence of viral mutation(s) within the areas targeted by this assay, and inadequate number of viral copies(<138 copies/mL). A negative result must be combined with clinical observations, patient history, and epidemiological information. The expected result is Negative.  Fact Sheet for Patients:  EntrepreneurPulse.com.au  Fact Sheet for Healthcare Providers:  IncredibleEmployment.be  This test is no t yet approved or cleared by the [place] FDA and  has been authorized for detection and/or diagnosis of SARS-CoV-2 by FDA under an Emergency Use Authorization (EUA). This EUA will remain  in effect (meaning this test can be used) for the duration of the COVID-19 declaration under Section 564(b)(1) of the Act, 21 U.S.C.section 360bbb-3(b)(1), unless the authorization is terminated  or revoked sooner.       Influenza A by PCR NEGATIVE NEGATIVE Final   Influenza B by PCR NEGATIVE NEGATIVE Final    Comment: (NOTE) The Xpert Xpress SARS-CoV-2/FLU/RSV plus assay is intended as an aid in the diagnosis of influenza from Nasopharyngeal swab specimens and should not be used as a sole basis for treatment. Nasal washings and aspirates are unacceptable for Xpert Xpress SARS-CoV-2/FLU/RSV testing.  Fact Sheet for Patients: EntrepreneurPulse.com.au  Fact Sheet for Healthcare Providers: IncredibleEmployment.be  This test is not yet approved or cleared by the [place] FDA and has been authorized for detection and/or diagnosis of SARS-CoV-2 by FDA under an Emergency Use Authorization (EUA). This EUA will remain in effect (meaning this test can be used) for the duration of  the COVID-19 declaration under Section 564(b)(1) of the Act, 21 U.S.C. section 360bbb-3(b)(1), unless the authorization is terminated or revoked.  Performed at [facility], [address], [place], [place] [address]          Radiology Studies: No results found.      Scheduled Meds:  acidophilus  1 capsule Oral Daily   apixaban  5 mg Oral BID   B-complex with vitamin C  1 tablet Oral q morning   diclofenac sodium  4 g Topical TID   diltiazem  120 mg Oral Daily   feeding supplement  237 mL Oral q morning   furosemide  40 mg Intravenous Q12H   insulin aspart  0-5 Units Subcutaneous QHS   insulin aspart  0-9 Units Subcutaneous TID WC   melatonin  5 mg Oral QHS   metoprolol tartrate  50 mg Oral BID   multivitamin-lutein  1 capsule Oral q morning   pantoprazole  40 mg Oral BID   polyethylene glycol  17 g Oral Daily   pravastatin  20  mg Oral QHS   sertraline  100 mg Oral Daily   vitamin E  400 Units Oral q morning   Continuous Infusions:  magnesium sulfate bolus IVPB 2 g (10/04/20 0853)     LOS: 3 days    Time spent: 27 minutes    Sharen Hones, MD [facility]   To contact the attending provider between 7A-7P or the covering provider during after hours 7P-7A, please log into the web site [email or web address] and access using universal [place] password for that web site. If you do not have the password, please call the hospital operator.  10/04/2020, 9:40 AM

## 2020-10-05 LAB — BASIC METABOLIC PANEL
Anion gap: 9 (ref 5–15)
BUN: 34 mg/dL — ABNORMAL HIGH (ref 8–23)
CO2: 40 mmol/L — ABNORMAL HIGH (ref 22–32)
Calcium: 9 mg/dL (ref 8.9–10.3)
Chloride: 90 mmol/L — ABNORMAL LOW (ref 98–111)
Creatinine, Ser: 0.82 mg/dL (ref 0.44–1.00)
GFR, Estimated: >60 mL/min (ref >60)
Glucose, Bld: 151 mg/dL — ABNORMAL HIGH (ref 70–99)
Potassium: 3.7 mmol/L (ref 3.5–5.1)
Sodium: 139 mmol/L (ref 135–145)

## 2020-10-05 LAB — GLUCOSE, CAPILLARY
Glucose-Capillary: 189 mg/dL — ABNORMAL HIGH (ref 70–99)
Glucose-Capillary: 191 mg/dL — ABNORMAL HIGH (ref 70–99)
Glucose-Capillary: 186 mg/dL — ABNORMAL HIGH (ref 70–99)
Glucose-Capillary: 160 mg/dL — ABNORMAL HIGH (ref 70–99)

## 2020-10-05 LAB — MAGNESIUM: Magnesium: 1.8 mg/dL (ref 1.7–2.4)

## 2020-10-05 NOTE — Care Management Important Message (Signed)
Important Message  Patient Details  Name: [name] MRN: [id number] Date of Birth: [birth year]   Medicare Important Message Given:  Yes     Dannette Barbara 10/05/2020, 1:17 PM

## 2020-10-05 NOTE — Progress Notes (Signed)
PROGRESS NOTE    [name]  EGB:[id number] DOB: 1927-08-06 DOA: 10/01/2020 PCP: Ria Bush, MD   Chief complaint.  Shortness of breath. Brief Narrative:  [name] is a 87 y.o. female with medical history significant of dCHF, on 2 L oxygen at home, hypertension, hyperlipidemia, diabetes mellitus, stroke, GERD, depression, anxiety, atrial fibrillation and DVT on Eliquis, varicose vein, peptic ulcer disease, duodenal ulcer with perforation, PAD, anemia, UTI, diverticulitis, CAD, CABG, CKD stage III, who presents with shortness of breath.  She also developed a worsening hypoxemia. CT chest did not show any evidence of pulmonary emboli.  She is placed on IV Lasix for acute on chronic diastolic congestive heart failure.   Assessment & Plan:   Principal Problem:   Acute on chronic diastolic CHF (congestive heart failure) (HCC) Active Problems:   Coronary artery disease   Hypertension associated with type 2 diabetes mellitus (HCC)   Hyperlipidemia associated with type 2 diabetes mellitus (HCC)   Diabetes mellitus type 2 with peripheral artery disease (HCC)   Acute on chronic respiratory failure with hypoxia and hypercapnia (HCC)   History of CVA in adulthood   Atrial fibrillation, chronic (HCC)   Depression with anxiety   DVT (deep venous thrombosis) ([place])   Hypomagnesemia  #1.  Acute on chronic respiratory failure with hypoxemia and hypercapnia. Acute on chronic diastolic congestive heart failure. Coronary artery disease. Chronic atrial fibrillation. Patient is close to euvolemic.  Renal function still normal, will continue another day of IV Lasix. Patient still on 2 L oxygen, he states that she was not on oxygen at nursing home.  We will continue wean off oxygen. Continue Eliquis.  2.  Uncontrolled type 2 diabetes with hyperglycemia Continue current regimen for  3.  History of DVT. History of stroke. Continue Eliquis.     DVT prophylaxis: Eliquis Code  Status: DNR Family Communication:  Disposition Plan:    Status is: Inpatient  Remains inpatient appropriate because:Inpatient level of care appropriate due to severity of illness  Dispo: The patient is from: SNF              Anticipated d/c is to: SNF              Patient currently is medically stable to d/c.   Difficult to place patient No        I/O last 3 completed shifts: In: 1080 [P.O.:1080] Out: 2900 [Urine:2900] Total I/O In: 360 [P.O.:360] Out: 800 [Urine:800]     Consultants:  None  Procedures: None  Antimicrobials: None   Subjective: Patient doing better today, not sickly short of breath.  Still requiring oxygen. No abdominal pain nausea vomiting  No headache or dizzines No chest pain or palpitation. No fever or chills.  Objective: Vitals:   10/05/20 0400 10/05/20 0446 10/05/20 0700 10/05/20 1115  BP:   (!) 149/53 (!) 119/58  Pulse:   70 69  Resp:   15 18  Temp: (!) 97.4 F (36.3 C)  97.6 F (36.4 C) 97.8 F (36.6 C)  TempSrc: Oral  Oral Oral  SpO2:   99% 97%  Weight:  60.4 kg 59.6 kg   Height:        Intake/Output Summary (Last 24 hours) at 10/05/2020 1232 Last data filed at 10/05/2020 1100 Gross per 24 hour  Intake 840 ml  Output 1400 ml  Net -560 ml   Filed Weights   10/04/20 0050 10/05/20 0446 10/05/20 0700  Weight: 61.5 kg 60.4 kg 59.6 kg  Examination:  General exam: Appears calm and comfortable  Respiratory system: A few crackles in the base. Respiratory effort normal. Cardiovascular system: Irregular.  No JVD, murmurs, rubs, gallops or clicks. No pedal edema. Gastrointestinal system: Abdomen is nondistended, soft and nontender. No organomegaly or masses felt. Normal bowel sounds heard. Central nervous system: Alert and oriented. No focal neurological deficits. Extremities: Symmetric 5 x 5 power. Skin: No rashes, lesions or ulcers Psychiatry: Judgement and insight appear normal. Mood & affect appropriate.     Data  Reviewed: I have personally reviewed following labs and imaging studies  CBC: Recent Labs  Lab 10/01/20 0512  WBC 10.4  NEUTROABS 7.5  HGB 10.6*  HCT 35.2*  MCV 94.9  PLT 038   Basic Metabolic Panel: Recent Labs  Lab 10/01/20 0512 10/02/20 0429 10/03/20 0353 10/04/20 0553 10/05/20 0421  NA 138 140 139 139 139  K 4.5 3.6 3.6 3.9 3.7  CL 98 95* 92* 90* 90*  CO2 33* 38* 39* 39* 40*  GLUCOSE 172* 126* 154* 157* 151*  BUN 21 19 22  25* 34*  CREATININE 0.58 0.66 0.64 0.64 0.82  CALCIUM 9.2 8.7* 8.7* 8.9 9.0  MG 1.5* 1.4* 2.0 1.6* 1.8   GFR: Estimated Creatinine Clearance: 30.3 mL/min (by C-G formula based on SCr of 0.82 mg/dL). Liver Function Tests: No results for input(s): AST, ALT, ALKPHOS, BILITOT, PROT, ALBUMIN in the last 168 hours. No results for input(s): LIPASE, AMYLASE in the last 168 hours. No results for input(s): AMMONIA in the last 168 hours. Coagulation Profile: No results for input(s): INR, PROTIME in the last 168 hours. Cardiac Enzymes: No results for input(s): CKTOTAL, CKMB, CKMBINDEX, TROPONINI in the last 168 hours. BNP (last 3 results) No results for input(s): PROBNP in the last 8760 hours. HbA1C: No results for input(s): HGBA1C in the last 72 hours. CBG: Recent Labs  Lab 10/04/20 1110 10/04/20 1625 10/04/20 2135 10/05/20 0848 10/05/20 1120  GLUCAP 186* 179* 200* 189* 191*   Lipid Profile: No results for input(s): CHOL, HDL, LDLCALC, TRIG, CHOLHDL, LDLDIRECT in the last 72 hours. Thyroid Function Tests: No results for input(s): TSH, T4TOTAL, [name]4, T3FREE, THYROIDAB in the last 72 hours. Anemia Panel: No results for input(s): VITAMINB12, FOLATE, FERRITIN, TIBC, IRON, RETICCTPCT in the last 72 hours. Sepsis Labs: Recent Labs  Lab 10/01/20 0512  PROCALCITON <0.10    Recent Results (from the past 240 hour(s))  Resp Panel by RT-PCR (Flu A&B, Covid) Nasopharyngeal Swab     Status: None   Collection Time: 10/01/20  5:12 AM   Specimen:  Nasopharyngeal Swab; Nasopharyngeal(NP) swabs in vial transport medium  Result Value Ref Range Status   SARS Coronavirus 2 by RT PCR NEGATIVE NEGATIVE Final    Comment: (NOTE) SARS-CoV-2 target nucleic acids are NOT DETECTED.  The SARS-CoV-2 RNA is generally detectable in upper respiratory specimens during the acute phase of infection. The lowest concentration of SARS-CoV-2 viral copies this assay can detect is 138 copies/mL. A negative result does not preclude SARS-Cov-2 infection and should not be used as the sole basis for treatment or other patient management decisions. A negative result may occur with  improper specimen collection/handling, submission of specimen other than nasopharyngeal swab, presence of viral mutation(s) within the areas targeted by this assay, and inadequate number of viral copies(<138 copies/mL). A negative result must be combined with clinical observations, patient history, and epidemiological information. The expected result is Negative.  Fact Sheet for Patients:  EntrepreneurPulse.com.au  Fact Sheet for Healthcare Providers:  IncredibleEmployment.be  This test is no t yet approved or cleared by the [place] and  has been authorized for detection and/or diagnosis of SARS-CoV-2 by FDA under an Emergency Use Authorization (EUA). This EUA will remain  in effect (meaning this test can be used) for the duration of the COVID-19 declaration under Section 564(b)(1) of the Act, 21 U.S.C.section 360bbb-3(b)(1), unless the authorization is terminated  or revoked sooner.       Influenza A by PCR NEGATIVE NEGATIVE Final   Influenza B by PCR NEGATIVE NEGATIVE Final    Comment: (NOTE) The Xpert Xpress SARS-CoV-2/FLU/RSV plus assay is intended as an aid in the diagnosis of influenza from Nasopharyngeal swab specimens and should not be used as a sole basis for treatment. Nasal washings and aspirates are unacceptable for  Xpert Xpress SARS-CoV-2/FLU/RSV testing.  Fact Sheet for Patients: EntrepreneurPulse.com.au  Fact Sheet for Healthcare Providers: IncredibleEmployment.be  This test is not yet approved or cleared by the [place] FDA and has been authorized for detection and/or diagnosis of SARS-CoV-2 by FDA under an Emergency Use Authorization (EUA). This EUA will remain in effect (meaning this test can be used) for the duration of the COVID-19 declaration under Section 564(b)(1) of the Act, 21 U.S.C. section 360bbb-3(b)(1), unless the authorization is terminated or revoked.  Performed at [facility], [address] [place], [place] [address]          Radiology Studies: No results found.      Scheduled Meds:  acidophilus  1 capsule Oral Daily   apixaban  5 mg Oral BID   B-complex with vitamin C  1 tablet Oral q morning   diclofenac sodium  4 g Topical TID   feeding supplement  237 mL Oral q morning   furosemide  40 mg Intravenous Q12H   insulin aspart  0-5 Units Subcutaneous QHS   insulin aspart  0-9 Units Subcutaneous TID WC   melatonin  5 mg Oral QHS   metoprolol tartrate  50 mg Oral BID   multivitamin-lutein  1 capsule Oral q morning   pantoprazole  40 mg Oral BID   polyethylene glycol  17 g Oral Daily   pravastatin  20 mg Oral QHS   sertraline  100 mg Oral Daily   vitamin E  400 Units Oral q morning   Continuous Infusions:   LOS: 4 days    Time spent: 26 minutes    Sharen Hones, MD [facility]   To contact the attending provider between 7A-7P or the covering provider during after hours 7P-7A, please log into the web site [email or web address] and access using universal [place] password for that web site. If you do not have the password, please call the hospital operator.  10/05/2020, 12:32 PM

## 2020-10-06 DIAGNOSIS — E1122 Type 2 diabetes mellitus with diabetic chronic kidney disease: Secondary | ICD-10-CM | POA: Diagnosis present

## 2020-10-06 DIAGNOSIS — R443 Hallucinations, unspecified: Secondary | ICD-10-CM | POA: Diagnosis not present

## 2020-10-06 DIAGNOSIS — Z20822 Contact with and (suspected) exposure to covid-19: Secondary | ICD-10-CM | POA: Diagnosis present

## 2020-10-06 DIAGNOSIS — M5136 Other intervertebral disc degeneration, lumbar region: Secondary | ICD-10-CM | POA: Diagnosis present

## 2020-10-06 DIAGNOSIS — E785 Hyperlipidemia, unspecified: Secondary | ICD-10-CM | POA: Diagnosis present

## 2020-10-06 DIAGNOSIS — Z8249 Family history of ischemic heart disease and other diseases of the circulatory system: Secondary | ICD-10-CM | POA: Diagnosis not present

## 2020-10-06 DIAGNOSIS — E1165 Type 2 diabetes mellitus with hyperglycemia: Secondary | ICD-10-CM | POA: Diagnosis present

## 2020-10-06 DIAGNOSIS — E1140 Type 2 diabetes mellitus with diabetic neuropathy, unspecified: Secondary | ICD-10-CM | POA: Diagnosis not present

## 2020-10-06 DIAGNOSIS — M545 Low back pain, unspecified: Secondary | ICD-10-CM | POA: Diagnosis not present

## 2020-10-06 DIAGNOSIS — Z9981 Dependence on supplemental oxygen: Secondary | ICD-10-CM | POA: Diagnosis not present

## 2020-10-06 DIAGNOSIS — H9190 Unspecified hearing loss, unspecified ear: Secondary | ICD-10-CM | POA: Diagnosis present

## 2020-10-06 DIAGNOSIS — I7025 Atherosclerosis of native arteries of other extremities with ulceration: Secondary | ICD-10-CM | POA: Diagnosis not present

## 2020-10-06 DIAGNOSIS — S72141D Displaced intertrochanteric fracture of right femur, subsequent encounter for closed fracture with routine healing: Secondary | ICD-10-CM | POA: Diagnosis not present

## 2020-10-06 DIAGNOSIS — R011 Cardiac murmur, unspecified: Secondary | ICD-10-CM | POA: Diagnosis not present

## 2020-10-06 DIAGNOSIS — I2510 Atherosclerotic heart disease of native coronary artery without angina pectoris: Secondary | ICD-10-CM | POA: Diagnosis present

## 2020-10-06 DIAGNOSIS — I10 Essential (primary) hypertension: Secondary | ICD-10-CM | POA: Diagnosis not present

## 2020-10-06 DIAGNOSIS — R6510 Systemic inflammatory response syndrome (SIRS) of non-infectious origin without acute organ dysfunction: Secondary | ICD-10-CM | POA: Diagnosis not present

## 2020-10-06 DIAGNOSIS — F32A Depression, unspecified: Secondary | ICD-10-CM | POA: Diagnosis present

## 2020-10-06 DIAGNOSIS — I4819 Other persistent atrial fibrillation: Secondary | ICD-10-CM | POA: Diagnosis not present

## 2020-10-06 DIAGNOSIS — I480 Paroxysmal atrial fibrillation: Secondary | ICD-10-CM | POA: Diagnosis present

## 2020-10-06 DIAGNOSIS — I5033 Acute on chronic diastolic (congestive) heart failure: Secondary | ICD-10-CM | POA: Diagnosis not present

## 2020-10-06 DIAGNOSIS — R0689 Other abnormalities of breathing: Secondary | ICD-10-CM | POA: Diagnosis not present

## 2020-10-06 DIAGNOSIS — M4800 Spinal stenosis, site unspecified: Secondary | ICD-10-CM | POA: Diagnosis not present

## 2020-10-06 DIAGNOSIS — J9600 Acute respiratory failure, unspecified whether with hypoxia or hypercapnia: Secondary | ICD-10-CM | POA: Diagnosis not present

## 2020-10-06 DIAGNOSIS — Z85828 Personal history of other malignant neoplasm of skin: Secondary | ICD-10-CM | POA: Diagnosis not present

## 2020-10-06 DIAGNOSIS — R0902 Hypoxemia: Secondary | ICD-10-CM | POA: Diagnosis not present

## 2020-10-06 DIAGNOSIS — Z79899 Other long term (current) drug therapy: Secondary | ICD-10-CM | POA: Diagnosis not present

## 2020-10-06 DIAGNOSIS — N183 Chronic kidney disease, stage 3 unspecified: Secondary | ICD-10-CM | POA: Diagnosis present

## 2020-10-06 DIAGNOSIS — K5790 Diverticulosis of intestine, part unspecified, without perforation or abscess without bleeding: Secondary | ICD-10-CM | POA: Diagnosis not present

## 2020-10-06 DIAGNOSIS — E1151 Type 2 diabetes mellitus with diabetic peripheral angiopathy without gangrene: Secondary | ICD-10-CM | POA: Diagnosis present

## 2020-10-06 DIAGNOSIS — H9193 Unspecified hearing loss, bilateral: Secondary | ICD-10-CM | POA: Diagnosis not present

## 2020-10-06 DIAGNOSIS — J811 Chronic pulmonary edema: Secondary | ICD-10-CM | POA: Diagnosis not present

## 2020-10-06 DIAGNOSIS — G9341 Metabolic encephalopathy: Secondary | ICD-10-CM | POA: Diagnosis present

## 2020-10-06 DIAGNOSIS — R739 Hyperglycemia, unspecified: Secondary | ICD-10-CM | POA: Diagnosis not present

## 2020-10-06 DIAGNOSIS — J9811 Atelectasis: Secondary | ICD-10-CM | POA: Diagnosis not present

## 2020-10-06 DIAGNOSIS — I872 Venous insufficiency (chronic) (peripheral): Secondary | ICD-10-CM | POA: Diagnosis present

## 2020-10-06 DIAGNOSIS — I110 Hypertensive heart disease with heart failure: Secondary | ICD-10-CM | POA: Diagnosis not present

## 2020-10-06 DIAGNOSIS — R519 Headache, unspecified: Secondary | ICD-10-CM | POA: Diagnosis not present

## 2020-10-06 DIAGNOSIS — F411 Generalized anxiety disorder: Secondary | ICD-10-CM | POA: Diagnosis present

## 2020-10-06 DIAGNOSIS — J9621 Acute and chronic respiratory failure with hypoxia: Secondary | ICD-10-CM | POA: Diagnosis present

## 2020-10-06 DIAGNOSIS — J9611 Chronic respiratory failure with hypoxia: Secondary | ICD-10-CM | POA: Diagnosis not present

## 2020-10-06 DIAGNOSIS — R0602 Shortness of breath: Secondary | ICD-10-CM | POA: Diagnosis not present

## 2020-10-06 DIAGNOSIS — I517 Cardiomegaly: Secondary | ICD-10-CM | POA: Diagnosis not present

## 2020-10-06 DIAGNOSIS — K589 Irritable bowel syndrome without diarrhea: Secondary | ICD-10-CM | POA: Diagnosis present

## 2020-10-06 DIAGNOSIS — I482 Chronic atrial fibrillation, unspecified: Secondary | ICD-10-CM | POA: Diagnosis present

## 2020-10-06 DIAGNOSIS — I129 Hypertensive chronic kidney disease with stage 1 through stage 4 chronic kidney disease, or unspecified chronic kidney disease: Secondary | ICD-10-CM | POA: Diagnosis not present

## 2020-10-06 DIAGNOSIS — I5030 Unspecified diastolic (congestive) heart failure: Secondary | ICD-10-CM | POA: Diagnosis not present

## 2020-10-06 DIAGNOSIS — R12 Heartburn: Secondary | ICD-10-CM | POA: Diagnosis not present

## 2020-10-06 DIAGNOSIS — Z87891 Personal history of nicotine dependence: Secondary | ICD-10-CM | POA: Diagnosis not present

## 2020-10-06 DIAGNOSIS — R279 Unspecified lack of coordination: Secondary | ICD-10-CM | POA: Diagnosis not present

## 2020-10-06 DIAGNOSIS — Z7901 Long term (current) use of anticoagulants: Secondary | ICD-10-CM | POA: Diagnosis not present

## 2020-10-06 DIAGNOSIS — M25551 Pain in right hip: Secondary | ICD-10-CM | POA: Diagnosis not present

## 2020-10-06 DIAGNOSIS — E7800 Pure hypercholesterolemia, unspecified: Secondary | ICD-10-CM | POA: Diagnosis not present

## 2020-10-06 DIAGNOSIS — I700 Atherosclerosis of aorta: Secondary | ICD-10-CM | POA: Diagnosis present

## 2020-10-06 DIAGNOSIS — M519 Unspecified thoracic, thoracolumbar and lumbosacral intervertebral disc disorder: Secondary | ICD-10-CM | POA: Diagnosis not present

## 2020-10-06 DIAGNOSIS — J90 Pleural effusion, not elsewhere classified: Secondary | ICD-10-CM | POA: Diagnosis not present

## 2020-10-06 DIAGNOSIS — I130 Hypertensive heart and chronic kidney disease with heart failure and stage 1 through stage 4 chronic kidney disease, or unspecified chronic kidney disease: Secondary | ICD-10-CM | POA: Diagnosis present

## 2020-10-06 DIAGNOSIS — I959 Hypotension, unspecified: Secondary | ICD-10-CM | POA: Diagnosis not present

## 2020-10-06 DIAGNOSIS — R42 Dizziness and giddiness: Secondary | ICD-10-CM | POA: Diagnosis not present

## 2020-10-06 DIAGNOSIS — Z66 Do not resuscitate: Secondary | ICD-10-CM | POA: Diagnosis present

## 2020-10-06 DIAGNOSIS — I5023 Acute on chronic systolic (congestive) heart failure: Secondary | ICD-10-CM | POA: Diagnosis present

## 2020-10-06 DIAGNOSIS — W19XXXA Unspecified fall, initial encounter: Secondary | ICD-10-CM | POA: Diagnosis not present

## 2020-10-06 DIAGNOSIS — M549 Dorsalgia, unspecified: Secondary | ICD-10-CM | POA: Diagnosis present

## 2020-10-06 DIAGNOSIS — Z09 Encounter for follow-up examination after completed treatment for conditions other than malignant neoplasm: Secondary | ICD-10-CM | POA: Diagnosis not present

## 2020-10-06 DIAGNOSIS — J9622 Acute and chronic respiratory failure with hypercapnia: Secondary | ICD-10-CM | POA: Diagnosis not present

## 2020-10-06 DIAGNOSIS — F419 Anxiety disorder, unspecified: Secondary | ICD-10-CM | POA: Diagnosis present

## 2020-10-06 DIAGNOSIS — I5022 Chronic systolic (congestive) heart failure: Secondary | ICD-10-CM | POA: Diagnosis not present

## 2020-10-06 DIAGNOSIS — Z743 Need for continuous supervision: Secondary | ICD-10-CM | POA: Diagnosis not present

## 2020-10-06 DIAGNOSIS — Z86718 Personal history of other venous thrombosis and embolism: Secondary | ICD-10-CM | POA: Diagnosis not present

## 2020-10-06 DIAGNOSIS — Z7984 Long term (current) use of oral hypoglycemic drugs: Secondary | ICD-10-CM | POA: Diagnosis not present

## 2020-10-06 DIAGNOSIS — F418 Other specified anxiety disorders: Secondary | ICD-10-CM | POA: Diagnosis not present

## 2020-10-06 DIAGNOSIS — G8929 Other chronic pain: Secondary | ICD-10-CM | POA: Diagnosis present

## 2020-10-06 DIAGNOSIS — K219 Gastro-esophageal reflux disease without esophagitis: Secondary | ICD-10-CM | POA: Diagnosis not present

## 2020-10-06 LAB — BASIC METABOLIC PANEL
Anion gap: 6 (ref 5–15)
BUN: 37 mg/dL — ABNORMAL HIGH (ref 8–23)
CO2: 42 mmol/L — ABNORMAL HIGH (ref 22–32)
Calcium: 8.8 mg/dL — ABNORMAL LOW (ref 8.9–10.3)
Chloride: 91 mmol/L — ABNORMAL LOW (ref 98–111)
Creatinine, Ser: 0.84 mg/dL (ref 0.44–1.00)
GFR, Estimated: >60 mL/min (ref >60)
Glucose, Bld: 165 mg/dL — ABNORMAL HIGH (ref 70–99)
Potassium: 3.8 mmol/L (ref 3.5–5.1)
Sodium: 139 mmol/L (ref 135–145)

## 2020-10-06 LAB — GLUCOSE, CAPILLARY
Glucose-Capillary: 169 mg/dL — ABNORMAL HIGH (ref 70–99)
Glucose-Capillary: 196 mg/dL — ABNORMAL HIGH (ref 70–99)
Glucose-Capillary: 133 mg/dL — ABNORMAL HIGH (ref 70–99)

## 2020-10-06 LAB — MAGNESIUM: Magnesium: 1.7 mg/dL (ref 1.7–2.4)

## 2020-10-06 MED ORDER — TORSEMIDE 20 MG PO TABS: 20.0000 mg | ORAL_TABLET | Freq: Every day | ORAL | Status: DC

## 2020-10-06 MED ORDER — FUROSEMIDE 20 MG PO TABS: 40.0000 mg | ORAL_TABLET | Freq: Every day | ORAL | Status: DC

## 2020-10-06 MED ORDER — ALPRAZOLAM 0.25 MG PO TABS: 0.2500 mg | ORAL_TABLET | Freq: Three times a day (TID) | ORAL | 0 refills | Status: DC | PRN

## 2020-10-06 NOTE — Progress Notes (Signed)
Report called to [name] Bamberg at [facility]

## 2020-10-06 NOTE — Discharge Summary (Signed)
Physician Discharge Summary  [name] MRN: [id number] DOB/AGE: 07-31-1927 87 y.o.  Admit date: 10/01/2020 Discharge date: 10/06/2020  Admission Diagnoses:  Discharge Diagnoses:  Principal Problem:   Acute on chronic diastolic CHF (congestive heart failure) ([place]) Active Problems:   Coronary artery disease   Hypertension associated with type 2 diabetes mellitus ([place])   Hyperlipidemia associated with type 2 diabetes mellitus (HCC)   Diabetes mellitus type 2 with peripheral artery disease (HCC)   Acute on chronic respiratory failure with hypoxia and hypercapnia (HCC)   History of CVA in adulthood   Atrial fibrillation, chronic (HCC)   Depression with anxiety   DVT (deep venous thrombosis) (HCC)   Hypomagnesemia   Discharged Condition: fair  Hospital Course:  [name] is a 87 y.o. female with medical history significant of dCHF, on 2 L oxygen at home, hypertension, hyperlipidemia, diabetes mellitus, stroke, GERD, depression, anxiety, atrial fibrillation and DVT on Eliquis, varicose vein, peptic ulcer disease, duodenal ulcer with perforation, PAD, anemia, UTI, diverticulitis, CAD, CABG, CKD stage III, who presents with shortness of breath.  She also developed a worsening hypoxemia. CT chest did not show any evidence of pulmonary emboli.  BNP 455, procalcitonin less than 0.1. She is placed on IV Lasix for acute on chronic diastolic congestive heart failure. Patient condition had improved, currently she is euvolemic, she is medically stable to be discharged. Patient will be followed by PCP and cardiology in the near future.  #1.  Acute on chronic respiratory failure with hypoxemia and hypercapnia. Acute on chronic diastolic congestive heart failure. Coronary artery disease. Chronic atrial fibrillation. Patient is euvolemic.  Renal function still normal. Patient still on 2 L oxygen, based on record, this is her baseline. Continue Eliquis.  Patient has bradycardia  while in the hospital with a heart rate as low as 38, diltiazem was discontinued.  2.  Uncontrolled type 2 diabetes with hyperglycemia Resume home treatment  3.  History of DVT. History of stroke. Continue Eliquis.  Consults: None  Significant Diagnostic Studies:  CT ANGIOGRAPHY CHEST WITH CONTRAST   TECHNIQUE: Multidetector CT imaging of the chest was performed using the standard protocol during bolus administration of intravenous contrast. Multiplanar CT image reconstructions and MIPs were obtained to evaluate the vascular anatomy.   CONTRAST:  11mL OMNIPAQUE IOHEXOL 350 MG/ML SOLN   COMPARISON:  CTA chest 09/12/2020, and earlier.   FINDINGS: Cardiovascular: Suboptimal but adequate contrast bolus timing in the pulmonary arterial tree. Mild respiratory motion. Compared to the recent CTA on 09/12/2020,   There is no convincing filling defect identified in the pulmonary arteries to suggest acute pulmonary embolism.   Prior CABG. Stable cardiomegaly. No pericardial effusion. Mild contrast in the thoracic aorta today.   Mediastinum/Nodes: Mild reactive appearing mediastinal lymph nodes, slightly increased from earlier in the month.   Lungs/Pleura: Increased layering bilateral pleural effusions, now small to moderate with simple fluid density and mild loculation on the left. Major airways remain patent. There is increased dependent enhancing atelectasis, but also moderate new asymmetric peribronchial opacities in both the right upper and lower lobes. [name] to no pulmonary septal thickening.   Upper Abdomen: Negative visible liver, spleen, stomach, adrenal glands and left renal upper pole.   Musculoskeletal: Prior sternotomy. Osteopenia. Stable visualized osseous structures.   Review of the MIP images confirms the above findings.   IMPRESSION: 1. Suboptimal pulmonary artery contrast timing today but no pulmonary embolus identified.   2. Increased bilateral pleural  effusions since June 11th  with more atelectasis but new right upper and lower lobe confluent peribronchial opacity. Favor right lung Multilobar Bronchopneumonia over asymmetric pulmonary edema.   3. Stable cardiomegaly. Prior CABG. Aortic Atherosclerosis (ICD10-I70.0).     Electronically Signed   By: Genevie Ann M.D.   On: 10/01/2020 07:25   Treatments: IV lasix  Discharge Exam: Blood pressure (!) 122/56, pulse 77, temperature 97.8 F (36.6 C), temperature source Oral, resp. rate 15, height 4\' 6"  (1.372 m), weight 60.9 kg, SpO2 97 %. General appearance: alert and cooperative Resp: clear to auscultation bilaterally Cardio: Irregular, no murmurs GI: soft, non-tender; bowel sounds normal; no masses,  no organomegaly Extremities: extremities normal, atraumatic, no cyanosis or edema  Disposition: Discharge disposition: 03-Skilled Nursing Facility       Discharge Instructions     Diet - low sodium heart healthy   Complete by: As directed    Discharge wound care:   Complete by: As directed    Follow with RN   Increase activity slowly   Complete by: As directed       Allergies as of 10/06/2020       Reactions   Metformin And Related Diarrhea        Medication List     STOP taking these medications    Dilt-XR 120 MG 24 hr capsule Generic drug: diltiazem   loratadine 10 MG tablet Commonly known as: CLARITIN   oxyCODONE 5 MG immediate release tablet Commonly known as: Oxy IR/ROXICODONE   Vitamin E 450 MG (1000 UT) Caps       TAKE these medications    acetaminophen 325 MG tablet Commonly known as: TYLENOL Take 2 tablets (650 mg total) by mouth every 6 (six) hours. What changed:  when to take this reasons to take this   Align 4 MG Caps Take 1 capsule (4 mg total) by mouth daily.   ALPRAZolam 0.25 MG tablet Commonly known as: XANAX Take 1 tablet (0.25 mg total) by mouth 3 (three) times daily as needed for anxiety. What changed: Another medication  with the same name was removed. Continue taking this medication, and follow the directions you see here.   alum & mag hydroxide-simeth 170-017-49 MG/5ML suspension Commonly known as: MAALOX PLUS Take 30 mLs by mouth every 4 (four) hours as needed for indigestion. Almacone-2   B-complex with vitamin C tablet Take 1 tablet by mouth every morning.   bisacodyl 10 MG suppository Commonly known as: DULCOLAX Place 10 mg rectally daily as needed (constipation not relieved by MOM).   diclofenac sodium 1 % Gel Commonly known as: VOLTAREN Apply 1 application topically 3 (three) times daily. What changed:  how much to take when to take this additional instructions   Eliquis 5 MG Tabs tablet Generic drug: apixaban TAKE 1 TABLET BY MOUTH TWICE A DAY What changed: how much to take   Ensure Take 237 mLs by mouth every morning.   famotidine 20 MG tablet Commonly known as: Pepcid Take 1 tablet (20 mg total) by mouth daily as needed for heartburn (breakthrough heartburn).   fluticasone 50 MCG/ACT nasal spray Commonly known as: FLONASE Place 1 spray into both nostrils daily as needed for allergies.   furosemide 20 MG tablet Commonly known as: LASIX Take 2 tablets (40 mg total) by mouth daily. What changed: how much to take   GLUCOSAMINE-CHONDROITIN PO Take 1 tablet by mouth every morning.   ICaps Caps Take 1 capsule by mouth every morning.   melatonin 3 MG Tabs tablet  Take 1 tablet (3 mg total) by mouth at bedtime.   metoprolol tartrate 50 MG tablet Commonly known as: LOPRESSOR TAKE 1 TABLET BY MOUTH TWICE A DAY   nitroGLYCERIN 0.4 MG SL tablet Commonly known as: NITROSTAT Place 1 tablet (0.4 mg total) under the tongue every 5 (five) minutes as needed for chest pain.   ondansetron 4 MG tablet Commonly known as: ZOFRAN Take 1 tablet (4 mg total) by mouth every 6 (six) hours as needed for nausea.   OneTouch Delica Plus [id number] Misc Use to test blood sugar once daily dx  code E11.9   OneTouch Verio test strip Generic drug: glucose blood USE TO TEST BLOOD SUGAR ONCE DAILY. DX:E11.9   OXYGEN Inhale 2 L into the lungs continuous.   pantoprazole 40 MG tablet Commonly known as: PROTONIX TAKE 1 TABLET BY MOUTH TWICE A DAY   pioglitazone 15 MG tablet Commonly known as: ACTOS TAKE 1 TABLET BY MOUTH EVERY DAY   polyethylene glycol powder 17 GM/SCOOP powder Commonly known as: GLYCOLAX/MIRALAX Dissolve 1 capful (17 g) in water and drink daily.   potassium chloride 10 MEQ CR capsule Commonly known as: MICRO-K TAKE ONE CAPSULE BY MOUTH ONCE DAILY   pravastatin 20 MG tablet Commonly known as: PRAVACHOL Take 1 tablet (20 mg total) by mouth daily.   RA SALINE ENEMA RE Place 1 enema rectally daily as needed (constipation not relieved by Bisacodyl suppository).   sertraline 100 MG tablet Commonly known as: ZOLOFT Take 1 tablet (100 mg total) by mouth daily.   sitaGLIPtin 50 MG tablet Commonly known as: JANUVIA Take 50 mg by mouth every morning.               Discharge Care Instructions  (From admission, onward)           Start     Ordered   10/06/20 0000  Discharge wound care:       Comments: Follow with RN   10/06/20 0959            Follow-up Information     Ria Bush, MD Follow up in 1 week(s).   Specialty: Family Medicine Contact information: [place] [address] [phone]         Nahser, Wonda Cheng, MD Follow up in 2 week(s).   Specialty: Cardiology Contact information: [place] [address] [place] [address] [phone]                35 minutes Signed: Sharen Hones 10/06/2020, 9:59 AM

## 2020-10-06 NOTE — Progress Notes (Signed)
Discharge summary done, returning to nursing home.  They have had no person to accept patient during weekend and holidays, I just was told that we cannot accept the patient today because there is no bed. Diuretics changed to oral.

## 2020-10-06 NOTE — TOC Transition Note (Signed)
Transition of Care [facility]) - CM/SW Discharge Note   Patient Details  Name: [name] MRN: [id number] Date of Birth: January 12, 1928  [facility] CM/SW Contact:  Eileen Stanford, LCSW Phone Number: 10/06/2020, 11:58 AM   Clinical Narrative:   Clinical Social Worker facilitated patient discharge including contacting patient family and facility to confirm patient discharge plans.  Clinical information faxed to facility and family agreeable with plan.  CSW arranged ambulance transport via [facility] to [facility] .  RN to call [phone] for report prior to discharge.    Final next level of care: Skilled Nursing Facility Barriers to Discharge: No Barriers Identified   Patient Goals and CMS Choice Patient states their goals for this hospitalization and ongoing recovery are:: to get better   Choice offered to / list presented to : Patient, Adult Children  Discharge Placement              Patient chooses bed at:  ([facility]) Patient to be transferred to facility by: [facility]   Patient and family notified of of transfer: 10/06/20  Discharge Plan and Services In-house Referral: Clinical Social Work   Post Acute Care Choice: Deputy                               Social Determinants of Health (SDOH) Interventions     Readmission Risk Interventions Readmission Risk Prevention Plan 10/02/2020 09/04/2019  Transportation Screening Complete Complete  PCP or Specialist Appt within 3-5 Days Complete Complete  HRI or Home Care Consult Complete Complete  Social Work Consult for [place] Planning/Counseling Complete -  Palliative Care Screening Not Applicable Not Applicable  Medication Review Press photographer) Complete Complete  Some recent data might be hidden

## 2020-10-07 DIAGNOSIS — I5030 Unspecified diastolic (congestive) heart failure: Secondary | ICD-10-CM | POA: Diagnosis not present

## 2020-10-07 DIAGNOSIS — I482 Chronic atrial fibrillation, unspecified: Secondary | ICD-10-CM | POA: Diagnosis not present

## 2020-10-07 DIAGNOSIS — E1151 Type 2 diabetes mellitus with diabetic peripheral angiopathy without gangrene: Secondary | ICD-10-CM | POA: Diagnosis not present

## 2020-10-12 NOTE — Progress Notes (Signed)
[name] ID: [name], female    DOB: April 20, 1927, 87 y.o.   MRN: [id number]  HPI  [name] is a 87 y/o female with a history of CAD, DM, hyperlipidemia, HTN, anxiety, PUD, anemia, depression, IBS, DVT, cellulitis, previous tobacco use and chronic heart failure.   Echo report from 10/02/20 reviewed and showed an EF of 40-45% along with moderately elevated PA pressure of 52.7 mmHg along with moderate MR/TR. Echo report from 09/02/19 reviewed and showed an EF of 50-55% along with moderately elevated PA pressure and moderate MR without structural changes.   Admitted 10/01/20 due to shortness of breath. Chest CT negative for PE. Initially given IV lasix with transition to oral diuretics. Discharged to SNF after 5 days. Admitted 09/21/20 due to AMS due to medications. Head CT unchanged from previous one. UA positive so antibiotics given. Discharged after 5 days.   [name] presents today for a follow-up visit although hasn't been seen in > 1 year. [name] presents with a chief complaint of minimal shortness of breath upon moderate exertion. [name] describes this as chronic in nature having been present for several years. [name] has associated fatigue, right ankle swelling, headaches and intermittent light headedness along with this. [name] denies any difficulty sleeping, abdominal distention, palpitations, chest pain, cough or weight gain.   Said her biggest complaints are of constant heartburn irregardless of what [name] eats/ drinks. [name] also will intermittently hallucinate seeing objects (like a table) that when [name] reaches out to touch it, disappears. Originally was thought to possibly be due to anesthesia from recent hip surgery.   Past Medical History:  Diagnosis Date   Actinic keratosis 01/22/2020   right forearm   Acute deep vein thrombosis (DVT) of distal vein of right lower extremity ([place]) 12/2013   Acute thrombus of R proximal gastrocnemius vein. Symptomatic provoked distal DVT   Anxiety    Atherosclerosis  of aorta ([place]) 09/22/2020   Basal cell carcinoma 10/30/2018   R nasal bridge, treated with Moh's   Basal cell carcinoma 01/22/2020   left forearm, nodular   Cellulitis of leg, left 01/16/2014   Cerebral atrophy ([place]) 09/22/2020   Head CT 09/21/20 finding   Cerebral infarct ([place])    Head CT 09/21/20: remote LEFT frontoparietal white matter infarct.   Cerumen impaction 04/16/2013   Saw Dr Richardson Landry 02/2018 s/p cleaning   CHF (congestive heart failure) ([place])    Chronic back pain 03/2012   lumbar DDD with herniation - s/p L S1 transforaminal ESI (10/2012 Dr. Sharlet Salina)   Chronic respiratory failure with hypoxia, on home O2 therapy ([place]) 09/22/2020   Chronic venous insufficiency    Coronary artery disease    CABG 1998   Cystocele    Depression    Diabetes mellitus    type 2, followed by endo.   Diastolic dysfunction    Diverticulitis of duodenum    Diverticulosis    Duodenal ulcer perforation ([place]) 02/08/2019   Erythema of breast 04/18/2018   Hearing loss 09/22/2020   Hemorrhoids    Hip fracture ([place]) 09/05/2020   History of heart attack    History of recurrent UTIs 09/22/2020   Hx of echocardiogram    a. Echo 10/13: mild LVH, EF 55-60%, Gr 1 diast dysfn, PASP 32   Hyperlipidemia    Hypertension    Irritable bowel syndrome    Lethargy 07/27/2019   Microcytic anemia    MRSA cellulitis 03/31/2018   Osteoporosis 09/22/2020   PAD (peripheral artery disease) ([place]) 2013  ABI: R 0.91, L 0.83   Peptic ulcer disease    Pneumonia 02/15/2019   Postmenopausal osteoporosis 01/2019   T score of -2.6   PUD (peptic ulcer disease)    Sphenoid sinusitis 09/21/2020   Head CT 09/21/20: New RIGHT sphenoid sinus fluid/acute sinusitis.   Varicose veins    Venous ulcer of leg ([place]) 12/12/2013   Required prolonged treatment by wound center   Viral gastroenteritis due to [place] virus 05/13/2015   hospitalization   Past Surgical History:  Procedure Laterality Date   ABDOMINAL HYSTERECTOMY   04/04/1973   TAH,BSO   ABI  04/05/2011   R 0.91, L 0.83   ANGIOPLASTY Right 09/06/2018   Lucky Cowboy, MD (Vasc): PCTA peroneal a; PCTL SFA; PTLA popliteal a; DES stent right SFR and popliteal a   APPENDECTOMY     per pt report   CARDIOVASCULAR STRESS TEST  04/04/2013   low risk myoview (Nahser)   CATARACT SURGERY     CHOLECYSTECTOMY     COLONOSCOPY  02/02/2010   extensive diverticulosis throughout, small internal hemorrhoids, rec rpt 5 yrs (Dr. Allyn Kenner)   [place] GRAFT  04/04/1996   dexa scan  09/03/2011   femur -0.8, forearm 0.6 => normal   ENDOVENOUS ABLATION SAPHENOUS VEIN W/ LASER Left 03/04/2014   Kellie Simmering   [facility]  04/04/2012   L S1 transforaminal ESI x2 (Chasnis) without improvement   FEMORAL ARTERY STENT Right 09/06/2018   Lucky Cowboy, MD (Vasc): PCTA peroneal a; PCTL SFA; PTLA popliteal a; DES stent right SFR and popliteal a   HAND SURGERY     INTRAMEDULLARY (IM) NAIL INTERTROCHANTERIC Right 09/07/2020   Procedure: INTRAMEDULLARY (IM) NAIL INTERTROCHANTRIC;  Surgeon: Leandrew Koyanagi, MD;  Location: [facility];  Service: Orthopedics;  Laterality: Right;   lexiscan myoview  03/05/2011   Small basal inferolateral and anterolateral reversible perfusion defect suggests ischemia.  EF was normal.   old infarct, no new ischemia   LOWER EXTREMITY ANGIOGRAPHY Right 09/06/2018   Procedure: LOWER EXTREMITY ANGIOGRAPHY;  Surgeon: Algernon Huxley, MD;  Location: [place] [facility];  Service: Cardiovascular;  Laterality: Right;   POPLITEAL ARTERY STENT Right 09/06/2018   Lucky Cowboy, MD (Vasc): PCTA peroneal a; PCTL SFA; PTLA popliteal a; DES stent right SFR and popliteal a   toe surgery     TYMPANOPLASTY  1960s   R side   Vault prolapse A & P repair  04/05/2007   [facility]   Family History  Problem Relation Age of Onset   Hypertension Mother    Stroke Mother    Diabetes Father    Diabetes Sister    Diabetes Brother    Heart disease Son    Colon cancer Neg Hx    Social History    Tobacco Use   Smoking status: Former    Pack years: 0.00    Types: Cigarettes    Quit date: 03/05/1979    Years since quitting: 41.6   Smokeless tobacco: Never  Substance Use Topics   Alcohol use: No    Alcohol/week: 0.0 standard drinks   Allergies  Allergen Reactions   Metformin And Related Diarrhea   Prior to Admission medications   Medication Sig Start Date End Date Taking? Authorizing Provider  acetaminophen (TYLENOL) 325 MG tablet Take 2 tablets (650 mg total) by mouth every 6 (six) hours. 09/25/20  Yes Maness, Arnette Norris, MD  ALPRAZolam Duanne Moron) 0.25 MG tablet Take 1 tablet (0.25 mg total) by mouth 3 (three) times daily  as needed for anxiety. 10/06/20  Yes Sharen Hones, MD  alum & mag hydroxide-simeth (MAALOX PLUS) 400-400-40 MG/5ML suspension Take 30 mLs by mouth every 4 (four) hours as needed for indigestion. [place]-2   Yes [provider]  B Complex-C (B-COMPLEX WITH VITAMIN C) tablet Take 1 tablet by mouth every morning.   Yes [provider]  bisacodyl (DULCOLAX) 10 MG suppository Place 10 mg rectally daily as needed (constipation not relieved by MOM).   Yes [provider]  diclofenac sodium (VOLTAREN) 1 % GEL Apply 1 application topically 3 (three) times daily. 01/09/17  Yes Ria Bush, MD  ELIQUIS 5 MG TABS tablet TAKE 1 TABLET BY MOUTH TWICE A DAY 04/09/20  Yes Nahser, Wonda Cheng, MD  Ensure (ENSURE) Take 237 mLs by mouth every morning.   Yes [provider]  famotidine (PEPCID) 20 MG tablet Take 1 tablet (20 mg total) by mouth daily as needed for heartburn (breakthrough heartburn). 07/30/19  Yes Ria Bush, MD  fluticasone [facility]) 50 MCG/ACT nasal spray Place 1 spray into both nostrils daily as needed for allergies. 09/23/19  Yes [provider]  furosemide (LASIX) 20 MG tablet Take 2 tablets (40 mg total) by mouth daily. 10/06/20  Yes Sharen Hones, MD  GLUCOSAMINE-CHONDROITIN PO Take 1 tablet by mouth every morning.   Yes  [provider]  Lancets [facility] DELICA PLUS [id number]) MISC Use to test blood sugar once daily dx code E11.9 04/08/20  Yes Elayne Snare, MD  melatonin 3 MG TABS tablet Take 1 tablet (3 mg total) by mouth at bedtime. 09/25/20  Yes Maness, Arnette Norris, MD  metoprolol tartrate (LOPRESSOR) 50 MG tablet TAKE 1 TABLET BY MOUTH TWICE A DAY 04/09/20  Yes Ria Bush, MD  Multiple Vitamins-Minerals (ICAPS) CAPS Take 1 capsule by mouth every morning.   Yes [provider]  nitroGLYCERIN (NITROSTAT) 0.4 MG SL tablet Place 1 tablet (0.4 mg total) under the tongue every 5 (five) minutes as needed for chest pain. 02/02/17  Yes Nahser, Wonda Cheng, MD  ondansetron (ZOFRAN) 4 MG tablet Take 1 tablet (4 mg total) by mouth every 6 (six) hours as needed for nausea. 09/11/20  Yes Maness, Arnette Norris, MD  [facility] VERIO test strip USE TO TEST BLOOD SUGAR ONCE DAILY. DX:E11.9 04/09/20  Yes Elayne Snare, MD  OXYGEN Inhale 2 L into the lungs continuous.   Yes [provider]  pantoprazole (PROTONIX) 40 MG tablet TAKE 1 TABLET BY MOUTH TWICE A DAY 04/09/20  Yes Ria Bush, MD  polyethylene glycol powder (GLYCOLAX/MIRALAX) 17 GM/SCOOP powder Dissolve 1 capful (17 g) in water and drink daily. 09/26/20  Yes Maness, Arnette Norris, MD  potassium chloride (MICRO-K) 10 MEQ CR capsule TAKE ONE CAPSULE BY MOUTH ONCE DAILY 08/21/20  Yes Ria Bush, MD  pravastatin (PRAVACHOL) 20 MG tablet Take 1 tablet (20 mg total) by mouth daily. 05/27/20  Yes Ria Bush, MD  Probiotic Product (ALIGN) 4 MG CAPS Take 1 capsule (4 mg total) by mouth daily. 08/15/17  Yes Ria Bush, MD  sertraline (ZOLOFT) 100 MG tablet Take 1 tablet (100 mg total) by mouth daily. 09/26/20  Yes Maness, Arnette Norris, MD  sitaGLIPtin (JANUVIA) 50 MG tablet Take 50 mg by mouth every morning.   Yes [provider]  Sodium Phosphates (RA SALINE ENEMA RE) Place 1 enema rectally daily as needed (constipation not relieved by Bisacodyl suppository).    Yes [provider]    Review of Systems  Constitutional:  Positive for fatigue. Negative for appetite  change (decreased).  HENT:  Positive for hearing loss. Negative for congestion.   Eyes: Negative.   Respiratory:  Positive for shortness of breath (with moderate exertion). Negative for cough.   Cardiovascular:  Positive for leg swelling. Negative for chest pain and palpitations.  Gastrointestinal:  Negative for abdominal distention, abdominal pain and constipation.       Constant heartburn  Endocrine: Negative.   Genitourinary: Negative.   Musculoskeletal:  Negative for back pain and neck pain.  Skin: Negative.   Allergic/Immunologic: Negative.   Neurological:  Positive for light-headedness (at times) and headaches. Negative for dizziness.  Hematological:  Negative for adenopathy. Does not bruise/bleed easily.  Psychiatric/Behavioral:  Positive for hallucinations. Negative for dysphoric mood and sleep disturbance. The [name] is not nervous/anxious.    Vitals:   10/13/20 1229  BP: 117/72  Pulse: (!) 113  Resp: 14  SpO2: 96%  Weight: 130 lb (59 kg)  Height: 4\' 9"  (1.448 m)   Wt Readings from Last 3 Encounters:  10/13/20 130 lb (59 kg)  10/06/20 134 lb 4.2 oz (60.9 kg)  09/22/20 137 lb (62.1 kg)   Lab Results  Component Value Date   CREATININE 0.84 10/06/2020   CREATININE 0.82 10/05/2020   CREATININE 0.64 10/04/2020   Physical Exam Vitals and nursing note reviewed. Exam conducted with a chaperone present (grandson).  Constitutional:      Appearance: Normal appearance.  HENT:     Head: Normocephalic and atraumatic.     Right Ear: Decreased hearing noted.     Left Ear: Decreased hearing noted.  Cardiovascular:     Rate and Rhythm: Regular rhythm. Tachycardia present.  Pulmonary:     Effort: Pulmonary effort is normal. No respiratory distress.     Breath sounds: No wheezing or rales.  Abdominal:     General: There is no distension.     Palpations:  Abdomen is soft.     Tenderness: There is no abdominal tenderness.  Musculoskeletal:        General: No tenderness.     Cervical back: Normal range of motion and neck supple.     Right lower leg: No edema.     Left lower leg: No edema.  Skin:    General: Skin is warm and dry.  Neurological:     General: No focal deficit present.     Mental Status: [name] is alert and oriented to person, place, and time.  Psychiatric:        Mood and Affect: Mood normal.        Behavior: Behavior normal.        Thought Content: Thought content normal.   Assessment & Plan:  1: Chronic heart failure with reduced ejection fraction- - NYHA class II - euvolemic today  - being weighed daily; reminded to call for an overnight weight gain of >2 pounds or a weekly weight gain of >5 pounds - not adding salt but is unsure of sodium content - encouraged to elevate legs when sitting for long periods of time - saw cardiology Cathie Olden) 06/30/20 - currently getting PT Mon-Fri at [facility] - BNP 10/01/20 was 455.4  2: HTN- - BP looks good today - saw PCP Danise Mina) 08/28/20; currently seeing provider at [facility] 10/06/20 reviewed and showed sodium 139, potassium 3.8, creatinine 0.84 and GFR >60  3: DM-  - A1c 08/28/20 was 7.3% - glucose 159 from this morning   Advised [name] to let nurse/provider at facility aware of reflux symptoms  as well as hallucinations.   Due to HF stability, will not make a return appointment for [name] at this time. Advised [name] and grandson that they could call back at anytime to make another appointment and they were comfortable with this plan.

## 2020-10-13 ENCOUNTER — Encounter: Payer: Self-pay | Admitting: Family

## 2020-10-13 ENCOUNTER — Ambulatory Visit: Payer: Medicare Other | Attending: Family | Admitting: Family

## 2020-10-13 ENCOUNTER — Other Ambulatory Visit: Payer: Self-pay

## 2020-10-13 VITALS — BP 117/72 | HR 113 | Resp 14 | Ht <= 58 in | Wt 130.0 lb

## 2020-10-13 DIAGNOSIS — I2510 Atherosclerotic heart disease of native coronary artery without angina pectoris: Secondary | ICD-10-CM | POA: Diagnosis not present

## 2020-10-13 DIAGNOSIS — Z79899 Other long term (current) drug therapy: Secondary | ICD-10-CM | POA: Insufficient documentation

## 2020-10-13 DIAGNOSIS — R12 Heartburn: Secondary | ICD-10-CM | POA: Diagnosis not present

## 2020-10-13 DIAGNOSIS — R443 Hallucinations, unspecified: Secondary | ICD-10-CM | POA: Diagnosis not present

## 2020-10-13 DIAGNOSIS — R519 Headache, unspecified: Secondary | ICD-10-CM | POA: Diagnosis not present

## 2020-10-13 DIAGNOSIS — Z86718 Personal history of other venous thrombosis and embolism: Secondary | ICD-10-CM | POA: Insufficient documentation

## 2020-10-13 DIAGNOSIS — Z7984 Long term (current) use of oral hypoglycemic drugs: Secondary | ICD-10-CM | POA: Insufficient documentation

## 2020-10-13 DIAGNOSIS — F419 Anxiety disorder, unspecified: Secondary | ICD-10-CM | POA: Diagnosis not present

## 2020-10-13 DIAGNOSIS — I110 Hypertensive heart disease with heart failure: Secondary | ICD-10-CM | POA: Insufficient documentation

## 2020-10-13 DIAGNOSIS — E785 Hyperlipidemia, unspecified: Secondary | ICD-10-CM | POA: Insufficient documentation

## 2020-10-13 DIAGNOSIS — R42 Dizziness and giddiness: Secondary | ICD-10-CM | POA: Insufficient documentation

## 2020-10-13 DIAGNOSIS — Z8249 Family history of ischemic heart disease and other diseases of the circulatory system: Secondary | ICD-10-CM | POA: Diagnosis not present

## 2020-10-13 DIAGNOSIS — E1151 Type 2 diabetes mellitus with diabetic peripheral angiopathy without gangrene: Secondary | ICD-10-CM | POA: Insufficient documentation

## 2020-10-13 DIAGNOSIS — I10 Essential (primary) hypertension: Secondary | ICD-10-CM

## 2020-10-13 DIAGNOSIS — N1831 Chronic kidney disease, stage 3a: Secondary | ICD-10-CM

## 2020-10-13 DIAGNOSIS — K589 Irritable bowel syndrome without diarrhea: Secondary | ICD-10-CM | POA: Diagnosis not present

## 2020-10-13 DIAGNOSIS — Z7901 Long term (current) use of anticoagulants: Secondary | ICD-10-CM | POA: Insufficient documentation

## 2020-10-13 DIAGNOSIS — Z87891 Personal history of nicotine dependence: Secondary | ICD-10-CM | POA: Diagnosis not present

## 2020-10-13 DIAGNOSIS — Z09 Encounter for follow-up examination after completed treatment for conditions other than malignant neoplasm: Secondary | ICD-10-CM | POA: Insufficient documentation

## 2020-10-13 DIAGNOSIS — I5022 Chronic systolic (congestive) heart failure: Secondary | ICD-10-CM | POA: Diagnosis not present

## 2020-10-13 DIAGNOSIS — F32A Depression, unspecified: Secondary | ICD-10-CM | POA: Insufficient documentation

## 2020-10-13 DIAGNOSIS — R0602 Shortness of breath: Secondary | ICD-10-CM | POA: Insufficient documentation

## 2020-10-13 DIAGNOSIS — E1122 Type 2 diabetes mellitus with diabetic chronic kidney disease: Secondary | ICD-10-CM

## 2020-10-13 NOTE — Patient Instructions (Signed)
Continue weighing daily and call for an overnight weight gain of > 2 pounds or a weekly weight gain of >5 pounds. 

## 2020-10-21 ENCOUNTER — Other Ambulatory Visit: Payer: Self-pay

## 2020-10-21 ENCOUNTER — Telehealth: Payer: Self-pay

## 2020-10-21 ENCOUNTER — Ambulatory Visit (INDEPENDENT_AMBULATORY_CARE_PROVIDER_SITE_OTHER): Payer: Medicare Other | Admitting: Orthopaedic Surgery

## 2020-10-21 ENCOUNTER — Encounter: Payer: Self-pay | Admitting: Orthopaedic Surgery

## 2020-10-21 ENCOUNTER — Ambulatory Visit (INDEPENDENT_AMBULATORY_CARE_PROVIDER_SITE_OTHER): Payer: Medicare Other

## 2020-10-21 VITALS — Ht <= 58 in | Wt 130.0 lb

## 2020-10-21 DIAGNOSIS — M25551 Pain in right hip: Secondary | ICD-10-CM

## 2020-10-21 DIAGNOSIS — S72141A Displaced intertrochanteric fracture of right femur, initial encounter for closed fracture: Secondary | ICD-10-CM

## 2020-10-21 NOTE — Progress Notes (Signed)
Post-Op Visit Note   Patient: [name]           Date of Birth: 10-21-1927           MRN: [id number] Visit Date: 10/21/2020 PCP: Ria Bush, MD   Assessment & Plan:  Chief Complaint:  Chief Complaint  Patient presents with   Right Hip - Follow-up    Right IM nailing of intertrochanteric femur fracture 09/07/2020   Visit Diagnoses:  1. Displaced intertrochanteric fracture of right femur, initial encounter for closed fracture ([name])   2. Pain in right hip     Plan: [name] is 6 weeks status post right intertrochanteric femur fracture.  Date of surgery 09/07/2020.  Currently doing physical therapy at her facility.  She is able to move her leg much better and without pain.  No real complaints.  Her surgical scars are fully healed.  Range of motion of the hip does not elicit significant pain.  X-rays demonstrate stable fixation alignment without any complications.    She is recovering appropriately.  She understands that it is a long recovery from a hip fracture.  She seems to be in good spirits overall.  She will continue to work hard with physical therapy.  We will see her back in 6 weeks with two-view x-rays of the right femur.  Follow-Up Instructions: Return in about 6 weeks (around 12/02/2020).   Orders:  Orders Placed This Encounter  Procedures   XR FEMUR, MIN 2 VIEWS RIGHT   No orders of the defined types were placed in this encounter.   Imaging: XR FEMUR, MIN 2 VIEWS RIGHT  Result Date: 10/21/2020 Stable fixation alignment of fracture without any complications.   PMFS History: Patient Active Problem List   Diagnosis Date Noted   Hypomagnesemia 10/03/2020   Depression with anxiety 10/01/2020   DVT (deep venous thrombosis) ([place]) 10/01/2020   Acute on chronic diastolic CHF (congestive heart failure) ([place]) 10/01/2020   Atrial fibrillation, chronic ([place]) 09/22/2020   Cerebral atrophy ([place]) 09/22/2020   Atherosclerosis of aorta ([place]) 09/22/2020    Osteoporosis 09/22/2020   Bilirubinemia 09/22/2020   Leukocytosis 09/22/2020   Pyuria 09/22/2020   History of recurrent UTIs 09/22/2020   Hearing loss 09/22/2020   Chronic respiratory failure with hypoxia, on home O2 therapy ([place]) 09/22/2020   SIRS (systemic inflammatory response syndrome) (HCC)    Altered mental status, unspecified 09/21/2020   Postoperative anemia due to acute blood loss 09/13/2020   Displaced intertrochanteric fracture of right femur, initial encounter for closed fracture ([place]) 09/05/2020   Hip fracture due to osteoporosis with routine healing 09/05/2020   Financial difficulty 02/26/2020   UTI (urinary tract infection) 10/01/2019   CKD stage 3 due to type 2 diabetes mellitus ([place]) 09/02/2019   History of CVA in adulthood 08/23/2019   Polypharmacy 07/27/2019   E coli bacteremia 02/13/2019   Dyspnea    Acute on chronic respiratory failure with hypoxia and hypercapnia (HCC)    Chronic leg pain 11/14/2018   Atherosclerosis of native arteries of the extremities with ulceration ([place]) 08/21/2018   Chronic constipation 06/18/2018   Advanced directives, counseling/discussion 01/29/2018   Bilateral hearing loss 01/29/2018   Spinal stenosis of lumbosacral region 49/70/2637   Systolic murmur 85/88/5027   De Quervain's tenosynovitis, left 01/09/2017   Encounter for chronic pain management 02/09/2016   Paroxysmal atrial fibrillation (HCC)    Varicose veins of left lower extremity with inflammation, with ulcer of calf limited to breakdown of skin ([place]) 05/22/2015  Varicose veins of right lower extremity with inflammation 01/23/2015   Diabetes mellitus type 2 with peripheral artery disease (HCC)    Chronic venous insufficiency    Bleeding from varicose veins of lower extremity, left 02/13/2014   Diabetic polyneuropathy ([place]) 11/19/2013   Right shoulder pain 08/19/2013   Abdominal pain 05/14/2012   Unsteady gait 12/13/2011   Chronic lower back pain 09/14/2011    Hyperlipidemia associated with type 2 diabetes mellitus ([place]) 09/06/2010   GAD (generalized anxiety disorder) 03/15/2010   Obesity, Class I, BMI 30-34.9 03/15/2010   Hypertension associated with type 2 diabetes mellitus ([place]) 03/15/2010   (HFpEF) heart failure with preserved ejection fraction ([place]) 03/15/2010   Coronary artery disease    Past Medical History:  Diagnosis Date   Actinic keratosis 01/22/2020   right forearm   Acute deep vein thrombosis (DVT) of distal vein of right lower extremity ([place]) 12/2013   Acute thrombus of R proximal gastrocnemius vein. Symptomatic provoked distal DVT   Anxiety    Atherosclerosis of aorta ([place]) 09/22/2020   Basal cell carcinoma 10/30/2018   R nasal bridge, treated with Moh's   Basal cell carcinoma 01/22/2020   left forearm, nodular   Cellulitis of leg, left 01/16/2014   Cerebral atrophy ([place]) 09/22/2020   Head CT 09/21/20 finding   Cerebral infarct ([place])    Head CT 09/21/20: remote LEFT frontoparietal white matter infarct.   Cerumen impaction 04/16/2013   Saw Dr Richardson Landry 02/2018 s/p cleaning   CHF (congestive heart failure) ([place])    Chronic back pain 03/2012   lumbar DDD with herniation - s/p L S1 transforaminal ESI (10/2012 Dr. Sharlet Salina)   Chronic respiratory failure with hypoxia, on home O2 therapy ([place]) 09/22/2020   Chronic venous insufficiency    Coronary artery disease    CABG 1998   Cystocele    Depression    Diabetes mellitus    type 2, followed by endo.   Diastolic dysfunction    Diverticulitis of duodenum    Diverticulosis    Duodenal ulcer perforation ([place]) 02/08/2019   Erythema of breast 04/18/2018   Hearing loss 09/22/2020   Hemorrhoids    Hip fracture ([place]) 09/05/2020   History of heart attack    History of recurrent UTIs 09/22/2020   Hx of echocardiogram    a. Echo 10/13: mild LVH, EF 55-60%, Gr 1 diast dysfn, PASP 32   Hyperlipidemia    Hypertension    Irritable bowel syndrome    Lethargy 07/27/2019   Microcytic  anemia    MRSA cellulitis 03/31/2018   Osteoporosis 09/22/2020   PAD (peripheral artery disease) ([place]) 2013   ABI: R 0.91, L 0.83   Peptic ulcer disease    Pneumonia 02/15/2019   Postmenopausal osteoporosis 01/2019   T score of -2.6   PUD (peptic ulcer disease)    Sphenoid sinusitis 09/21/2020   Head CT 09/21/20: New RIGHT sphenoid sinus fluid/acute sinusitis.   Varicose veins    Venous ulcer of leg ([place]) 12/12/2013   Required prolonged treatment by wound center   Viral gastroenteritis due to [place] virus 05/13/2015   hospitalization    Family History  Problem Relation Age of Onset   Hypertension Mother    Stroke Mother    Diabetes Father    Diabetes Sister    Diabetes Brother    Heart disease Son    Colon cancer Neg Hx     Past Surgical History:  Procedure Laterality Date   ABDOMINAL HYSTERECTOMY  04/04/1973  TAH,BSO   ABI  04/05/2011   R 0.91, L 0.83   ANGIOPLASTY Right 09/06/2018   Lucky Cowboy, MD (Vasc): PCTA peroneal a; PCTL SFA; PTLA popliteal a; DES stent right SFR and popliteal a   APPENDECTOMY     per pt report   CARDIOVASCULAR STRESS TEST  04/04/2013   low risk myoview (Nahser)   CATARACT SURGERY     CHOLECYSTECTOMY     COLONOSCOPY  02/02/2010   extensive diverticulosis throughout, small internal hemorrhoids, rec rpt 5 yrs (Dr. Allyn Kenner)   [place] GRAFT  04/04/1996   dexa scan  09/03/2011   femur -0.8, forearm 0.6 => normal   ENDOVENOUS ABLATION SAPHENOUS VEIN W/ LASER Left 03/04/2014   Kellie Simmering   [facility]  04/04/2012   L S1 transforaminal ESI x2 (Chasnis) without improvement   FEMORAL ARTERY STENT Right 09/06/2018   Lucky Cowboy, MD (Vasc): PCTA peroneal a; PCTL SFA; PTLA popliteal a; DES stent right SFR and popliteal a   HAND SURGERY     INTRAMEDULLARY (IM) NAIL INTERTROCHANTERIC Right 09/07/2020   Procedure: INTRAMEDULLARY (IM) NAIL INTERTROCHANTRIC;  Surgeon: Leandrew Koyanagi, MD;  Location: [place];  Service: Orthopedics;  Laterality: Right;   lexiscan myoview   03/05/2011   Small basal inferolateral and anterolateral reversible perfusion defect suggests ischemia.  EF was normal.   old infarct, no new ischemia   LOWER EXTREMITY ANGIOGRAPHY Right 09/06/2018   Procedure: LOWER EXTREMITY ANGIOGRAPHY;  Surgeon: Algernon Huxley, MD;  Location: [facility] [facility];  Service: Cardiovascular;  Laterality: Right;   POPLITEAL ARTERY STENT Right 09/06/2018   Lucky Cowboy, MD (Vasc): PCTA peroneal a; PCTL SFA; PTLA popliteal a; DES stent right SFR and popliteal a   toe surgery     TYMPANOPLASTY  1960s   R side   Vault prolapse A & P repair  04/05/2007   [facility]   Social History   Occupational History   Occupation: Waitress   Tobacco Use   Smoking status: Former    Types: Cigarettes    Quit date: 03/05/1979    Years since quitting: 41.6   Smokeless tobacco: Never  Vaping Use   Vaping Use: Never used  Substance and Sexual Activity   Alcohol use: No    Alcohol/week: 0.0 standard drinks   Drug use: No   Sexual activity: Not Currently    Comment: 1st intercourse 87yo.--- 2 sexual partners

## 2020-10-21 NOTE — Chronic Care Management (AMB) (Addendum)
Chronic Care Management Pharmacy Assistant   Name: [name]  MRN: [id number] DOB: 06/30/1927  Reason for Encounter: CCM Reminder Call   Conditions to be addressed/monitored: CAD, HTN, HLD, and DMII  Medications: Outpatient Encounter Medications as of 10/21/2020  Medication Sig   acetaminophen (TYLENOL) 325 MG tablet Take 2 tablets (650 mg total) by mouth every 6 (six) hours.   ALPRAZolam (XANAX) 0.25 MG tablet Take 1 tablet (0.25 mg total) by mouth 3 (three) times daily as needed for anxiety.   alum & mag hydroxide-simeth (MAALOX PLUS) 400-400-40 MG/5ML suspension Take 30 mLs by mouth every 4 (four) hours as needed for indigestion. Almacone-2   B Complex-C (B-COMPLEX WITH VITAMIN C) tablet Take 1 tablet by mouth every morning.   bisacodyl (DULCOLAX) 10 MG suppository Place 10 mg rectally daily as needed (constipation not relieved by MOM).   diclofenac sodium (VOLTAREN) 1 % GEL Apply 1 application topically 3 (three) times daily.   ELIQUIS 5 MG TABS tablet TAKE 1 TABLET BY MOUTH TWICE A DAY   Ensure (ENSURE) Take 237 mLs by mouth every morning.   famotidine (PEPCID) 20 MG tablet Take 1 tablet (20 mg total) by mouth daily as needed for heartburn (breakthrough heartburn).   fluticasone (FLONASE) 50 MCG/ACT nasal spray Place 1 spray into both nostrils daily as needed for allergies.   furosemide (LASIX) 20 MG tablet Take 2 tablets (40 mg total) by mouth daily.   GLUCOSAMINE-CHONDROITIN PO Take 1 tablet by mouth every morning.   Lancets (ONETOUCH DELICA PLUS [id number]) MISC Use to test blood sugar once daily dx code E11.9   melatonin 3 MG TABS tablet Take 1 tablet (3 mg total) by mouth at bedtime.   metoprolol tartrate (LOPRESSOR) 50 MG tablet TAKE 1 TABLET BY MOUTH TWICE A DAY   Multiple Vitamins-Minerals (ICAPS) CAPS Take 1 capsule by mouth every morning.   nitroGLYCERIN (NITROSTAT) 0.4 MG SL tablet Place 1 tablet (0.4 mg total) under the tongue every 5 (five) minutes as needed for  chest pain.   ondansetron (ZOFRAN) 4 MG tablet Take 1 tablet (4 mg total) by mouth every 6 (six) hours as needed for nausea.   ONETOUCH VERIO test strip USE TO TEST BLOOD SUGAR ONCE DAILY. DX:E11.9   OXYGEN Inhale 2 L into the lungs continuous.   pantoprazole (PROTONIX) 40 MG tablet TAKE 1 TABLET BY MOUTH TWICE A DAY   polyethylene glycol powder (GLYCOLAX/MIRALAX) 17 GM/SCOOP powder Dissolve 1 capful (17 g) in water and drink daily.   potassium chloride (MICRO-K) 10 MEQ CR capsule TAKE ONE CAPSULE BY MOUTH ONCE DAILY   pravastatin (PRAVACHOL) 20 MG tablet Take 1 tablet (20 mg total) by mouth daily.   Probiotic Product (ALIGN) 4 MG CAPS Take 1 capsule (4 mg total) by mouth daily.   sertraline (ZOLOFT) 100 MG tablet Take 1 tablet (100 mg total) by mouth daily.   sitaGLIPtin (JANUVIA) 50 MG tablet Take 50 mg by mouth every morning.   Sodium Phosphates (RA SALINE ENEMA RE) Place 1 enema rectally daily as needed (constipation not relieved by Bisacodyl suppository).   No facility-administered encounter medications on file as of 10/21/2020.       Component Value Date/Time   CHOL 131 12/24/2019 1503   TRIG 71.0 12/24/2019 1503   HDL 50.50 12/24/2019 1503   LDLCALC 67 12/24/2019 [name]  did not answer the telephone  to remind her of her upcoming telephone visit with [name]  on 10/28/2020 at 11:45am. Patient was reminded to have all medications, supplements and any blood glucose and blood pressure readings available for review at appointment. Attempted to reach the patient 3 times 10/21/20 and 10/22/20  Star Rating Drugs: Medication:  Last Fill: Day Supply Januvia 50mg  09/15/20 - Pravastatin 20mg  08/19/20 90 Pioglitazone 15mg  09/08/20  [place], CPP notified  Avel Sensor, [place] Assistant [phone]  I have reviewed the care management and care coordination activities outlined in this encounter and I am certifying that I agree with the  content of this note. No further action required.  [name], PharmD Clinical Pharmacist [facility] [phone]

## 2020-10-23 ENCOUNTER — Ambulatory Visit (INDEPENDENT_AMBULATORY_CARE_PROVIDER_SITE_OTHER): Payer: Medicare Other | Admitting: Family

## 2020-10-23 ENCOUNTER — Other Ambulatory Visit: Payer: Self-pay

## 2020-10-23 ENCOUNTER — Encounter (HOSPITAL_BASED_OUTPATIENT_CLINIC_OR_DEPARTMENT_OTHER): Payer: Self-pay | Admitting: Family

## 2020-10-23 ENCOUNTER — Ambulatory Visit (HOSPITAL_BASED_OUTPATIENT_CLINIC_OR_DEPARTMENT_OTHER): Payer: Medicare Other | Admitting: Family

## 2020-10-23 VITALS — BP 116/64 | HR 110 | Ht <= 58 in | Wt 127.0 lb

## 2020-10-23 DIAGNOSIS — Z7901 Long term (current) use of anticoagulants: Secondary | ICD-10-CM | POA: Diagnosis not present

## 2020-10-23 DIAGNOSIS — I4819 Other persistent atrial fibrillation: Secondary | ICD-10-CM | POA: Diagnosis not present

## 2020-10-23 DIAGNOSIS — I10 Essential (primary) hypertension: Secondary | ICD-10-CM | POA: Diagnosis not present

## 2020-10-23 DIAGNOSIS — I5022 Chronic systolic (congestive) heart failure: Secondary | ICD-10-CM | POA: Diagnosis not present

## 2020-10-23 NOTE — Patient Instructions (Addendum)
Medication Instructions:   Recommend taking your Pepcid daily instead of as-needed to help improve your indigestion symptoms.  If your heart rate is consistently more than 100bpm when you are resting we will ask [place] to increase your Metoprolol Tartrate to 75mg  twice per day.   *If you need a refill on your cardiac medications before your next appointment, please call your pharmacy*   Lab Work: None ordered today.   Testing/Procedures: None ordered today   Follow-Up: At [facility], you and your health needs are our priority.  As part of our continuing mission to provide you with exceptional heart care, we have created designated Provider Care Teams.  These Care Teams include your primary Cardiologist (physician) and Advanced Practice Providers (APPs -  Physician Assistants and Nurse Practitioners) who all work together to provide you with the care you need, when you need it.  We recommend signing up for the patient portal called "MyChart".  Sign up information is provided on this After Visit Summary.  MyChart is used to connect with patients for Virtual Visits (Telemedicine).  Patients are able to view lab/test results, encounter notes, upcoming appointments, etc.  Non-urgent messages can be sent to your provider as well.   To learn more about what you can do with MyChart, go to [email or web address].    Your next appointment:   October 10th at 9:40 AM with Dr. Acie Fredrickson  *we will reach out to social work to ask for transportation to get you to this appointment*   Other Instructions  Heart Healthy Diet Recommendations: A low-salt diet is recommended. Meats should be grilled, baked, or boiled. Avoid fried foods. Focus on lean protein sources like fish or chicken with vegetables and fruits. The American Heart Association is a Microbiologist!  American Heart Association Diet and Lifeystyle Recommendations   Exercise recommendations: The American Heart Association recommends 150  minutes of moderate intensity exercise weekly. Try 30 minutes of moderate intensity exercise 4-5 times per week. This could include walking, jogging, or swimming.  To prevent or reduce lower extremity swelling: Eat a low salt diet. Salt makes the body hold onto extra fluid which causes swelling. Sit with legs elevated. For example, in the recliner or on an [place].  Wear knee-high compression stockings during the daytime. Ones labeled 15-20 mmHg provide good compression.   Recommend weighing daily and keeping a log. Please call our office if you have weight gain of 2 pounds overnight or 5 pounds in 1 week.

## 2020-10-23 NOTE — Progress Notes (Signed)
Office Visit    Patient Name: [name] Date of Encounter: 10/23/2020  PCP:  Ria Bush, [facility]  Cardiologist:  Mertie Moores, MD  Advanced Practice Provider:  No care team member to display Electrophysiologist:  None    Chief Complaint    [name] is a 87 y.o. female with a hx of chronic diastolic heart failure, coronary artery disease, DM2, hypertension, hyperlipidemia, chronic respiratory failure with hypoxia and hypercapnia, history of CVA, chronic atrial fibrillation on chronic anticoagulation, depression, DVT presents today for hospital follow up.   Past Medical History    Past Medical History:  Diagnosis Date   Actinic keratosis 01/22/2020   right forearm   Acute deep vein thrombosis (DVT) of distal vein of right lower extremity ([place]) 12/2013   Acute thrombus of R proximal gastrocnemius vein. Symptomatic provoked distal DVT   Anxiety    Atherosclerosis of aorta ([place]) 09/22/2020   Basal cell carcinoma 10/30/2018   R nasal bridge, treated with Moh's   Basal cell carcinoma 01/22/2020   left forearm, nodular   Cellulitis of leg, left 01/16/2014   Cerebral atrophy ([place]) 09/22/2020   Head CT 09/21/20 finding   Cerebral infarct ([place])    Head CT 09/21/20: remote LEFT frontoparietal white matter infarct.   Cerumen impaction 04/16/2013   Saw Dr Richardson Landry 02/2018 s/p cleaning   CHF (congestive heart failure) ([place])    Chronic back pain 03/2012   lumbar DDD with herniation - s/p L S1 transforaminal ESI (10/2012 Dr. Sharlet Salina)   Chronic respiratory failure with hypoxia, on home O2 therapy ([place]) 09/22/2020   Chronic venous insufficiency    Coronary artery disease    CABG 1998   Cystocele    Depression    Diabetes mellitus    type 2, followed by endo.   Diastolic dysfunction    Diverticulitis of duodenum    Diverticulosis    Duodenal ulcer perforation ([place]) 02/08/2019   Erythema of breast 04/18/2018   Hearing loss  09/22/2020   Hemorrhoids    Hip fracture ([place]) 09/05/2020   History of heart attack    History of recurrent UTIs 09/22/2020   Hx of echocardiogram    a. Echo 10/13: mild LVH, EF 55-60%, Gr 1 diast dysfn, PASP 32   Hyperlipidemia    Hypertension    Irritable bowel syndrome    Lethargy 07/27/2019   Microcytic anemia    MRSA cellulitis 03/31/2018   Osteoporosis 09/22/2020   PAD (peripheral artery disease) ([place]) 2013   ABI: R 0.91, L 0.83   Peptic ulcer disease    Pneumonia 02/15/2019   Postmenopausal osteoporosis 01/2019   T score of -2.6   PUD (peptic ulcer disease)    Sphenoid sinusitis 09/21/2020   Head CT 09/21/20: New RIGHT sphenoid sinus fluid/acute sinusitis.   Varicose veins    Venous ulcer of leg ([place]) 12/12/2013   Required prolonged treatment by wound center   Viral gastroenteritis due to [place] virus 05/13/2015   hospitalization   Past Surgical History:  Procedure Laterality Date   ABDOMINAL HYSTERECTOMY  04/04/1973   TAH,BSO   ABI  04/05/2011   R 0.91, L 0.83   ANGIOPLASTY Right 09/06/2018   Lucky Cowboy, MD (Vasc): PCTA peroneal a; PCTL SFA; PTLA popliteal a; DES stent right SFR and popliteal a   APPENDECTOMY     per pt report   CARDIOVASCULAR STRESS TEST  04/04/2013   low risk myoview ([place])   CATARACT  SURGERY     CHOLECYSTECTOMY     COLONOSCOPY  02/02/2010   extensive diverticulosis throughout, small internal hemorrhoids, rec rpt 5 yrs (Dr. Allyn Kenner)   [place] GRAFT  04/04/1996   dexa scan  09/03/2011   femur -0.8, forearm 0.6 => normal   ENDOVENOUS ABLATION SAPHENOUS VEIN W/ LASER Left 03/04/2014   Kellie Simmering   [facility]  04/04/2012   L S1 transforaminal ESI x2 ([place]) without improvement   FEMORAL ARTERY STENT Right 09/06/2018   Lucky Cowboy, MD (Vasc): PCTA peroneal a; PCTL SFA; PTLA popliteal a; DES stent right SFR and popliteal a   HAND SURGERY     INTRAMEDULLARY (IM) NAIL INTERTROCHANTERIC Right 09/07/2020   Procedure: INTRAMEDULLARY (IM) NAIL  INTERTROCHANTRIC;  Surgeon: Leandrew Koyanagi, MD;  Location: [facility];  Service: Orthopedics;  Laterality: Right;   lexiscan myoview  03/05/2011   Small basal inferolateral and anterolateral reversible perfusion defect suggests ischemia.  EF was normal.   old infarct, no new ischemia   LOWER EXTREMITY ANGIOGRAPHY Right 09/06/2018   Procedure: LOWER EXTREMITY ANGIOGRAPHY;  Surgeon: Algernon Huxley, MD;  Location: [place] [facility];  Service: Cardiovascular;  Laterality: Right;   POPLITEAL ARTERY STENT Right 09/06/2018   Lucky Cowboy, MD (Vasc): PCTA peroneal a; PCTL SFA; PTLA popliteal a; DES stent right SFR and popliteal a   toe surgery     TYMPANOPLASTY  1960s   R side   Vault prolapse A & P repair  04/05/2007   [facility]    Allergies  Allergies  Allergen Reactions   Metformin And Related Diarrhea    History of Present Illness    [name] is a 87 y.o. female with a hx of chronic diastolic heart failure, coronary artery disease s/p CABG 1996, DM2, hypertension, hyperlipidemia, chronic respiratory failure with hypoxia and hypercapnia, history of CVA, chronic atrial fibrillation on chronic anticoagulation, depression, DVT  last seen 06/30/2020 by Dr. Acie Fredrickson.  She has been followed by Dr. Acie Fredrickson.  She was last seen March 2022 doing overall well from a cardiac perspective.    She had significant admissions throughout the month of June.  She was admitted 09/05/2020 after mechanical fall leading to right hip fracture and underwent ORIF 09/07/2020.  She was discharged to SNF.  Hospitalized 09/21/2020 with altered mental status concerning for sepsis.  Head CT was unchanged from previous.  She was treated for right UTI with antibiotics.  She also had A. fib with RVR and was borderline hypotensive on presentation.  Her home medications of Eliquis, Lopressor, diltiazem are gradually resumed.  She was hospitalized 10/01/2020 with shortness of breath and worsening hypoxemia.  CT chest with no  evidence of PE.  She was placed on IV Lasix for acute on chronic diastolic congestive heart failure.  She was discharged on 2 L of oxygen.  Her diltiazem was discontinued due to bradycardia.  She was discharged to [facility].  She had echocardiogram 10/02/2020 showing LVEF 40 to 45%, indeterminate diastolic parameters, RV SF moderately reduced, RV mildly enlarged, mildly elevated PASP, LA moderately dilated, moderate mitral regurgitation, severe mitral annular calcification, moderate TR, mild to moderate of valve sclerosis without stenosis, right atrial pressure 15 mmHg.  She presents today for follow-up.  Visit very difficult as she is from skilled nursing facility and very hard of hearing. Notes heartburn and indigestion which improved with her as needed Pepcid.  She is concerned that she was not on oxygen at home prior to her  initial admission earlier in June.  She is enjoying participating in therapy 5 times per week and feels she is getting stronger.  Reports no chest pain, pressure, tightness.  She is hopeful to return home with her significant other.  EKGs/Labs/Other Studies Reviewed:   The following studies were reviewed today:  Echo 10/02/20 1. Left ventricular ejection fraction, by estimation, is 40 to 45%. The  left ventricle has mildly decreased function. Left ventricular endocardial  border not optimally defined to evaluate regional wall motion. Left  ventricular diastolic parameters are  indeterminate.   2. Right ventricular systolic function is moderately reduced. The right  ventricular size is mildly enlarged. There is moderately elevated  pulmonary artery systolic pressure. The estimated right ventricular  systolic pressure is 20.2 mmHg.   3. Left atrial size was moderately dilated.   4. The mitral valve is normal in structure. Moderate mitral valve  regurgitation. No evidence of mitral stenosis. Severe mitral annular  calcification.   5. Tricuspid valve regurgitation is  moderate.   6. The aortic valve is normal in structure. Aortic valve regurgitation is  not visualized. Mild to moderate aortic valve sclerosis/calcification is  present, without any evidence of aortic stenosis.   7. The inferior vena cava is dilated in size with <50% respiratory  variability, suggesting right atrial pressure of 15 mmHg.  EKG:  No EKG today  Recent Labs: 07/29/2020: TSH 3.60 09/21/2020: ALT 41 10/01/2020: B Natriuretic Peptide 455.4; Hemoglobin 10.6; Platelets 355 10/06/2020: BUN 37; Creatinine, Ser 0.84; Magnesium 1.7; Potassium 3.8; Sodium 139  Recent Lipid Panel    Component Value Date/Time   CHOL 131 12/24/2019 1503   TRIG 71.0 12/24/2019 1503   HDL 50.50 12/24/2019 1503   CHOLHDL 3 12/24/2019 1503   VLDL 14.2 12/24/2019 1503   LDLCALC 67 12/24/2019 1503    Home Medications   Current Meds  Medication Sig   acetaminophen (TYLENOL) 325 MG tablet Take 2 tablets (650 mg total) by mouth every 6 (six) hours.   ALPRAZolam (XANAX) 0.25 MG tablet Take 1 tablet (0.25 mg total) by mouth 3 (three) times daily as needed for anxiety.   alum & mag hydroxide-simeth (MAALOX PLUS) 400-400-40 MG/5ML suspension Take 30 mLs by mouth every 4 (four) hours as needed for indigestion. Almacone-2   B Complex-C (B-COMPLEX WITH VITAMIN C) tablet Take 1 tablet by mouth every morning.   bisacodyl (DULCOLAX) 10 MG suppository Place 10 mg rectally daily as needed (constipation not relieved by MOM).   diclofenac sodium (VOLTAREN) 1 % GEL Apply 1 application topically 3 (three) times daily.   ELIQUIS 5 MG TABS tablet TAKE 1 TABLET BY MOUTH TWICE A DAY   Ensure (ENSURE) Take 237 mLs by mouth every morning.   famotidine (PEPCID) 20 MG tablet Take 1 tablet (20 mg total) by mouth daily as needed for heartburn (breakthrough heartburn).   fluticasone (FLONASE) 50 MCG/ACT nasal spray Place 1 spray into both nostrils daily as needed for allergies.   furosemide (LASIX) 20 MG tablet Take 2 tablets (40 mg  total) by mouth daily.   GLUCOSAMINE-CHONDROITIN PO Take 1 tablet by mouth every morning.   Lancets (ONETOUCH DELICA PLUS [id number]) MISC Use to test blood sugar once daily dx code E11.9   melatonin 3 MG TABS tablet Take 1 tablet (3 mg total) by mouth at bedtime.   metoprolol tartrate (LOPRESSOR) 50 MG tablet TAKE 1 TABLET BY MOUTH TWICE A DAY   Multiple Vitamins-Minerals (ICAPS) CAPS Take 1 capsule by mouth every  morning.   nitroGLYCERIN (NITROSTAT) 0.4 MG SL tablet Place 1 tablet (0.4 mg total) under the tongue every 5 (five) minutes as needed for chest pain.   ondansetron (ZOFRAN) 4 MG tablet Take 1 tablet (4 mg total) by mouth every 6 (six) hours as needed for nausea.   ONETOUCH VERIO test strip USE TO TEST BLOOD SUGAR ONCE DAILY. DX:E11.9   OXYGEN Inhale 2 L into the lungs continuous.   pantoprazole (PROTONIX) 40 MG tablet TAKE 1 TABLET BY MOUTH TWICE A DAY   polyethylene glycol powder (GLYCOLAX/MIRALAX) 17 GM/SCOOP powder Dissolve 1 capful (17 g) in water and drink daily.   potassium chloride (MICRO-K) 10 MEQ CR capsule TAKE ONE CAPSULE BY MOUTH ONCE DAILY   pravastatin (PRAVACHOL) 20 MG tablet Take 1 tablet (20 mg total) by mouth daily.   Probiotic Product (ALIGN) 4 MG CAPS Take 1 capsule (4 mg total) by mouth daily.   sertraline (ZOLOFT) 100 MG tablet Take 1 tablet (100 mg total) by mouth daily.   sitaGLIPtin (JANUVIA) 50 MG tablet Take 50 mg by mouth every morning.   Sodium Phosphates (RA SALINE ENEMA RE) Place 1 enema rectally daily as needed (constipation not relieved by Bisacodyl suppository).     Review of Systems      All other systems reviewed and are otherwise negative except as noted above.  Physical Exam    VS:  BP 116/64   Pulse (!) 110   Ht 4\' 9"  (1.448 m)   Wt 127 lb (57.6 kg) Comment: pt states weighed at 3AM today  BMI 27.48 kg/m  , BMI Body mass index is 27.48 kg/m.  Wt Readings from Last 3 Encounters:  10/23/20 127 lb (57.6 kg)  10/21/20 130 lb (59 kg)   10/13/20 130 lb (59 kg)     GEN: Well nourished, well developed, in no acute distress. HEENT: normal. Neck: Supple, no JVD, carotid bruits, or masses. Cardiac: IRIR, no murmurs, rubs, or gallops. No clubbing, cyanosis, edema.  Radials/PT 2+ and equal bilaterally.  Respiratory:  Respirations regular and unlabored, clear to auscultation bilaterally. GI: Soft, nontender, nondistended. MS: No deformity or atrophy. Skin: Warm and dry, no rash. Neuro:  Strength and sensation are intact. Psych: Normal affect.  Assessment & Plan    PAF /chronic anticoagulations /history of DVT -irregularly irregular by auscultation today.  Asymptomatic with no palpitations.  Diltiazem discontinued during recent admission due to hypotension.  Continue metoprolol titrate 50 mg daily.  I have provided a note for her nursing facility that if her resting heart rate is consistently greater than 100 bpm we should increase her metoprolol to 75 mg twice daily.  Will defer the change today as unfortunately do not have records of her vital signs at SNF.  Continue Eliquis 5 mg twice daily due to CHA2DS2-VASc of at least 5 (agex2, gender, HTN, CAD). Denies bleeding complications.  GERD -recommend taking her Pepcid daily when she is having breakthrough reflux symptoms.  HLD -continue pravastatin 20 mg daily.  HFrEF -Echo 10/02/2020 LVEF 40 to 45%.  GDMT limited by hypotension.  Continue metoprolol 50 mg twice daily.  Low-salt diet, elevating lower extremities, fluid restriction encouraged.  Given low normal blood pressure and history of hypertension very hesitant to add additional GDMT.  No indication for loop diuretic at this time as she is euvolemic and well compensated on exam.  Could consider addition of very low-dose ARB vs MRA at follow-up.  HTN - BP well controlled. Continue current antihypertensive regimen.  Diltiazem discontinued during recent admission due to hypotension.  No evidence of recurrent hypotension.  Reports  no lightheadedness, dizziness.  CAD s/p CABG - Stable with no anginal symptoms. No indication for ischemic evaluation.  GDMT includes metoprolol, statin.  No aspirin due to chronic anticoagulation.  Disposition: Follow up in 4 month(s) with Dr. Acie Fredrickson -Will route note to social work team to see if we can get her assistance this transportation.  Signed, Loel Dubonnet, NP 10/23/2020, 12:27 PM [place]

## 2020-10-26 ENCOUNTER — Telehealth: Payer: Self-pay | Admitting: Licensed Clinical Social Worker

## 2020-10-26 NOTE — Telephone Encounter (Signed)
LCSW received referral from NP for transportation, I reviewed chart, pt resides at [facility] currently post hospitalization in July. Per admissions liaison Magda Paganini w/ SNF pt family is working on ALF placement, she likely will transition in about two weeks. I will place reminder on my calendar prior to [facility] appointment in October to see if transportation needs still relevant.   Westley Hummer, MSW, [place]  [phone]

## 2020-10-28 ENCOUNTER — Telehealth: Payer: Medicare Other

## 2020-10-28 NOTE — Telephone Encounter (Addendum)
Patient is in short term rehab facility and unable to hear well on telephone. Not a candidate for telephone appts at this time. Cancelled CCM 10/28/20 at 11:45 AM.

## 2020-11-03 ENCOUNTER — Ambulatory Visit: Payer: Medicare Other | Admitting: Family Medicine

## 2020-11-03 ENCOUNTER — Telehealth: Payer: Self-pay

## 2020-11-03 NOTE — Telephone Encounter (Signed)
Per Dr. Darnell Level, pt maybe still at SNF and not able to make today's 2:30 OV.   Spoke with Magda Paganini of [facility] asking about pt.  She confirms pt is still at their facility and will not be able to make today's OV with Dr. Lawana Pai was trying to connect me with a nurse on pt's wing, however, no one picked up after holding for 7 mins.   C/x today's OV.

## 2020-11-10 ENCOUNTER — Other Ambulatory Visit: Payer: Self-pay

## 2020-11-10 ENCOUNTER — Emergency Department: Payer: Medicare Other

## 2020-11-10 ENCOUNTER — Inpatient Hospital Stay
Admission: EM | Admit: 2020-11-10 | Discharge: 2020-11-16 | DRG: 291 | Disposition: A | Discharge status: Skilled Nursing Facility | Payer: Medicare Other | Source: Skilled Nursing Facility | Attending: Internal Medicine | Admitting: Internal Medicine

## 2020-11-10 DIAGNOSIS — I5023 Acute on chronic systolic (congestive) heart failure: Secondary | ICD-10-CM

## 2020-11-10 DIAGNOSIS — E1122 Type 2 diabetes mellitus with diabetic chronic kidney disease: Secondary | ICD-10-CM | POA: Diagnosis present

## 2020-11-10 DIAGNOSIS — J90 Pleural effusion, not elsewhere classified: Secondary | ICD-10-CM | POA: Diagnosis not present

## 2020-11-10 DIAGNOSIS — Z79899 Other long term (current) drug therapy: Secondary | ICD-10-CM

## 2020-11-10 DIAGNOSIS — Z951 Presence of aortocoronary bypass graft: Secondary | ICD-10-CM

## 2020-11-10 DIAGNOSIS — F419 Anxiety disorder, unspecified: Secondary | ICD-10-CM | POA: Diagnosis present

## 2020-11-10 DIAGNOSIS — R011 Cardiac murmur, unspecified: Secondary | ICD-10-CM | POA: Diagnosis not present

## 2020-11-10 DIAGNOSIS — R531 Weakness: Secondary | ICD-10-CM | POA: Diagnosis not present

## 2020-11-10 DIAGNOSIS — I2510 Atherosclerotic heart disease of native coronary artery without angina pectoris: Secondary | ICD-10-CM | POA: Diagnosis present

## 2020-11-10 DIAGNOSIS — N183 Chronic kidney disease, stage 3 unspecified: Secondary | ICD-10-CM | POA: Diagnosis present

## 2020-11-10 DIAGNOSIS — R5381 Other malaise: Secondary | ICD-10-CM | POA: Diagnosis not present

## 2020-11-10 DIAGNOSIS — Z9981 Dependence on supplemental oxygen: Secondary | ICD-10-CM | POA: Diagnosis not present

## 2020-11-10 DIAGNOSIS — Z86718 Personal history of other venous thrombosis and embolism: Secondary | ICD-10-CM

## 2020-11-10 DIAGNOSIS — M5136 Other intervertebral disc degeneration, lumbar region: Secondary | ICD-10-CM | POA: Diagnosis present

## 2020-11-10 DIAGNOSIS — J811 Chronic pulmonary edema: Secondary | ICD-10-CM | POA: Diagnosis not present

## 2020-11-10 DIAGNOSIS — Z833 Family history of diabetes mellitus: Secondary | ICD-10-CM

## 2020-11-10 DIAGNOSIS — Z66 Do not resuscitate: Secondary | ICD-10-CM | POA: Diagnosis present

## 2020-11-10 DIAGNOSIS — J9611 Chronic respiratory failure with hypoxia: Secondary | ICD-10-CM | POA: Diagnosis not present

## 2020-11-10 DIAGNOSIS — M545 Low back pain, unspecified: Secondary | ICD-10-CM | POA: Diagnosis not present

## 2020-11-10 DIAGNOSIS — R739 Hyperglycemia, unspecified: Secondary | ICD-10-CM | POA: Diagnosis not present

## 2020-11-10 DIAGNOSIS — Z7984 Long term (current) use of oral hypoglycemic drugs: Secondary | ICD-10-CM

## 2020-11-10 DIAGNOSIS — I130 Hypertensive heart and chronic kidney disease with heart failure and stage 1 through stage 4 chronic kidney disease, or unspecified chronic kidney disease: Principal | ICD-10-CM | POA: Diagnosis present

## 2020-11-10 DIAGNOSIS — Z8673 Personal history of transient ischemic attack (TIA), and cerebral infarction without residual deficits: Secondary | ICD-10-CM | POA: Diagnosis not present

## 2020-11-10 DIAGNOSIS — W19XXXA Unspecified fall, initial encounter: Secondary | ICD-10-CM | POA: Diagnosis not present

## 2020-11-10 DIAGNOSIS — I517 Cardiomegaly: Secondary | ICD-10-CM | POA: Diagnosis not present

## 2020-11-10 DIAGNOSIS — J9600 Acute respiratory failure, unspecified whether with hypoxia or hypercapnia: Secondary | ICD-10-CM | POA: Diagnosis not present

## 2020-11-10 DIAGNOSIS — G8929 Other chronic pain: Secondary | ICD-10-CM | POA: Diagnosis present

## 2020-11-10 DIAGNOSIS — J9811 Atelectasis: Secondary | ICD-10-CM | POA: Diagnosis not present

## 2020-11-10 DIAGNOSIS — G9341 Metabolic encephalopathy: Secondary | ICD-10-CM | POA: Diagnosis present

## 2020-11-10 DIAGNOSIS — I482 Chronic atrial fibrillation, unspecified: Secondary | ICD-10-CM | POA: Diagnosis present

## 2020-11-10 DIAGNOSIS — I252 Old myocardial infarction: Secondary | ICD-10-CM

## 2020-11-10 DIAGNOSIS — I7025 Atherosclerosis of native arteries of other extremities with ulceration: Secondary | ICD-10-CM | POA: Diagnosis not present

## 2020-11-10 DIAGNOSIS — K589 Irritable bowel syndrome without diarrhea: Secondary | ICD-10-CM | POA: Diagnosis present

## 2020-11-10 DIAGNOSIS — Z7901 Long term (current) use of anticoagulants: Secondary | ICD-10-CM

## 2020-11-10 DIAGNOSIS — J9621 Acute and chronic respiratory failure with hypoxia: Secondary | ICD-10-CM | POA: Diagnosis present

## 2020-11-10 DIAGNOSIS — M549 Dorsalgia, unspecified: Secondary | ICD-10-CM | POA: Diagnosis present

## 2020-11-10 DIAGNOSIS — Z7401 Bed confinement status: Secondary | ICD-10-CM | POA: Diagnosis not present

## 2020-11-10 DIAGNOSIS — E1151 Type 2 diabetes mellitus with diabetic peripheral angiopathy without gangrene: Secondary | ICD-10-CM | POA: Diagnosis present

## 2020-11-10 DIAGNOSIS — E785 Hyperlipidemia, unspecified: Secondary | ICD-10-CM | POA: Diagnosis present

## 2020-11-10 DIAGNOSIS — Z20822 Contact with and (suspected) exposure to covid-19: Secondary | ICD-10-CM | POA: Diagnosis present

## 2020-11-10 DIAGNOSIS — J309 Allergic rhinitis, unspecified: Secondary | ICD-10-CM | POA: Diagnosis not present

## 2020-11-10 DIAGNOSIS — I5030 Unspecified diastolic (congestive) heart failure: Secondary | ICD-10-CM | POA: Diagnosis not present

## 2020-11-10 DIAGNOSIS — I959 Hypotension, unspecified: Secondary | ICD-10-CM | POA: Diagnosis not present

## 2020-11-10 DIAGNOSIS — Z90710 Acquired absence of both cervix and uterus: Secondary | ICD-10-CM

## 2020-11-10 DIAGNOSIS — H9190 Unspecified hearing loss, unspecified ear: Secondary | ICD-10-CM | POA: Diagnosis present

## 2020-11-10 DIAGNOSIS — E1165 Type 2 diabetes mellitus with hyperglycemia: Secondary | ICD-10-CM | POA: Diagnosis present

## 2020-11-10 DIAGNOSIS — I700 Atherosclerosis of aorta: Secondary | ICD-10-CM | POA: Diagnosis present

## 2020-11-10 DIAGNOSIS — N390 Urinary tract infection, site not specified: Secondary | ICD-10-CM | POA: Diagnosis not present

## 2020-11-10 DIAGNOSIS — R0602 Shortness of breath: Secondary | ICD-10-CM

## 2020-11-10 DIAGNOSIS — R0902 Hypoxemia: Secondary | ICD-10-CM | POA: Diagnosis not present

## 2020-11-10 DIAGNOSIS — E1169 Type 2 diabetes mellitus with other specified complication: Secondary | ICD-10-CM

## 2020-11-10 DIAGNOSIS — R0689 Other abnormalities of breathing: Secondary | ICD-10-CM | POA: Diagnosis not present

## 2020-11-10 DIAGNOSIS — I872 Venous insufficiency (chronic) (peripheral): Secondary | ICD-10-CM | POA: Diagnosis present

## 2020-11-10 DIAGNOSIS — R1312 Dysphagia, oropharyngeal phase: Secondary | ICD-10-CM | POA: Diagnosis not present

## 2020-11-10 DIAGNOSIS — F418 Other specified anxiety disorders: Secondary | ICD-10-CM | POA: Diagnosis not present

## 2020-11-10 DIAGNOSIS — F411 Generalized anxiety disorder: Secondary | ICD-10-CM | POA: Diagnosis present

## 2020-11-10 DIAGNOSIS — Z87440 Personal history of urinary (tract) infections: Secondary | ICD-10-CM

## 2020-11-10 DIAGNOSIS — Z8249 Family history of ischemic heart disease and other diseases of the circulatory system: Secondary | ICD-10-CM

## 2020-11-10 DIAGNOSIS — M810 Age-related osteoporosis without current pathological fracture: Secondary | ICD-10-CM | POA: Diagnosis present

## 2020-11-10 DIAGNOSIS — E876 Hypokalemia: Secondary | ICD-10-CM | POA: Diagnosis not present

## 2020-11-10 DIAGNOSIS — I480 Paroxysmal atrial fibrillation: Secondary | ICD-10-CM | POA: Diagnosis present

## 2020-11-10 DIAGNOSIS — E1140 Type 2 diabetes mellitus with diabetic neuropathy, unspecified: Secondary | ICD-10-CM | POA: Diagnosis not present

## 2020-11-10 DIAGNOSIS — M4800 Spinal stenosis, site unspecified: Secondary | ICD-10-CM | POA: Diagnosis not present

## 2020-11-10 DIAGNOSIS — F32A Depression, unspecified: Secondary | ICD-10-CM | POA: Diagnosis present

## 2020-11-10 DIAGNOSIS — Z85828 Personal history of other malignant neoplasm of skin: Secondary | ICD-10-CM

## 2020-11-10 DIAGNOSIS — Z9049 Acquired absence of other specified parts of digestive tract: Secondary | ICD-10-CM

## 2020-11-10 DIAGNOSIS — Z8711 Personal history of peptic ulcer disease: Secondary | ICD-10-CM

## 2020-11-10 LAB — BASIC METABOLIC PANEL
Anion gap: 7 (ref 5–15)
BUN: 30 mg/dL — ABNORMAL HIGH (ref 8–23)
CO2: 29 mmol/L (ref 22–32)
Calcium: 9.3 mg/dL (ref 8.9–10.3)
Chloride: 105 mmol/L (ref 98–111)
Creatinine, Ser: 0.79 mg/dL (ref 0.44–1.00)
GFR, Estimated: >60 mL/min (ref >60)
Glucose, Bld: 244 mg/dL — ABNORMAL HIGH (ref 70–99)
Potassium: 4.4 mmol/L (ref 3.5–5.1)
Sodium: 141 mmol/L (ref 135–145)

## 2020-11-10 LAB — CBC
HCT: 33.2 % — ABNORMAL LOW (ref 36.0–46.0)
Hemoglobin: 10.1 g/dL — ABNORMAL LOW (ref 12.0–15.0)
MCH: 29.5 pg (ref 26.0–34.0)
MCHC: 30.4 g/dL (ref 30.0–36.0)
MCV: 97.1 fL (ref 80.0–100.0)
Platelets: 256 10*3/uL (ref 150–400)
RBC: 3.42 MIL/uL — ABNORMAL LOW (ref 3.87–5.11)
RDW: 18.8 % — ABNORMAL HIGH (ref 11.5–15.5)
WBC: 9.5 10*3/uL (ref 4.0–10.5)
nRBC: 0 % (ref 0.0–0.2)

## 2020-11-10 LAB — TROPONIN I (HIGH SENSITIVITY)
Troponin I (High Sensitivity): 21 ng/L — ABNORMAL HIGH (ref <18)
Troponin I (High Sensitivity): 22 ng/L — ABNORMAL HIGH (ref <18)

## 2020-11-10 LAB — BRAIN NATRIURETIC PEPTIDE: B Natriuretic Peptide: 693.8 pg/mL — ABNORMAL HIGH (ref 0.0–100.0)

## 2020-11-10 LAB — RESP PANEL BY RT-PCR (FLU A&B, COVID) ARPGX2
Influenza A by PCR: NEGATIVE
Influenza B by PCR: NEGATIVE
SARS Coronavirus 2 by RT PCR: NEGATIVE

## 2020-11-10 LAB — PROCALCITONIN: Procalcitonin: <0.1 ng/mL

## 2020-11-10 MED ORDER — ONDANSETRON HCL 4 MG/2ML IJ SOLN: 4.0000 mg | Freq: Four times a day (QID) | INTRAMUSCULAR | Status: DC | PRN

## 2020-11-10 MED ORDER — ALPRAZOLAM 0.25 MG PO TABS
0.2500 mg | ORAL_TABLET | Freq: Two times a day (BID) | ORAL | Status: DC | PRN
  Administered 2020-11-10 – 2020-11-15 (×6): 0.25 mg via ORAL
  Filled 2020-11-10 (×6): qty 1

## 2020-11-10 MED ORDER — MELATONIN 5 MG PO TABS
2.5000 mg | ORAL_TABLET | Freq: Every day | ORAL | Status: DC
  Administered 2020-11-10 – 2020-11-15 (×5): 2.5 mg via ORAL
  Filled 2020-11-10 (×5): qty 1

## 2020-11-10 MED ORDER — IOHEXOL 350 MG/ML SOLN
75.0000 mL | Freq: Once | INTRAVENOUS | Status: AC | PRN
  Administered 2020-11-10: 75 mL via INTRAVENOUS

## 2020-11-10 MED ORDER — NITROGLYCERIN 0.4 MG SL SUBL: 0.4000 mg | SUBLINGUAL_TABLET | SUBLINGUAL | Status: DC | PRN

## 2020-11-10 MED ORDER — APIXABAN 5 MG PO TABS
5.0000 mg | ORAL_TABLET | Freq: Two times a day (BID) | ORAL | Status: DC
  Administered 2020-11-10 – 2020-11-11 (×2): 5 mg via ORAL
  Filled 2020-11-10 (×2): qty 1

## 2020-11-10 MED ORDER — ACETAMINOPHEN 325 MG PO TABS
650.0000 mg | ORAL_TABLET | Freq: Four times a day (QID) | ORAL | Status: DC | PRN
  Administered 2020-11-12 – 2020-11-16 (×3): 650 mg via ORAL
  Filled 2020-11-10 (×2): qty 2

## 2020-11-10 MED ORDER — DILTIAZEM HCL ER 60 MG PO CP12
60.0000 mg | ORAL_CAPSULE | Freq: Two times a day (BID) | ORAL | Status: DC
  Administered 2020-11-10 – 2020-11-11 (×2): 60 mg via ORAL
  Filled 2020-11-10 (×4): qty 1

## 2020-11-10 MED ORDER — HYDROCODONE-ACETAMINOPHEN 5-325 MG PO TABS
1.0000 | ORAL_TABLET | ORAL | Status: DC | PRN
  Administered 2020-11-11: 2 via ORAL
  Filled 2020-11-10: qty 2
  Filled 2020-11-10: qty 1

## 2020-11-10 MED ORDER — POLYETHYLENE GLYCOL 3350 17 G PO PACK: 17.0000 g | PACK | Freq: Every day | ORAL | Status: DC | PRN

## 2020-11-10 MED ORDER — PANTOPRAZOLE SODIUM 40 MG PO TBEC
40.0000 mg | DELAYED_RELEASE_TABLET | Freq: Two times a day (BID) | ORAL | Status: DC
  Administered 2020-11-10 – 2020-11-16 (×12): 40 mg via ORAL
  Filled 2020-11-10 (×12): qty 1

## 2020-11-10 MED ORDER — DICLOFENAC SODIUM 1 % EX GEL
4.0000 g | Freq: Three times a day (TID) | CUTANEOUS | Status: DC
  Administered 2020-11-12 – 2020-11-16 (×13): 4 g via TOPICAL
  Filled 2020-11-10 (×2): qty 100

## 2020-11-10 MED ORDER — VANCOMYCIN HCL IN DEXTROSE 1-5 GM/200ML-% IV SOLN
1000.0000 mg | Freq: Once | INTRAVENOUS | Status: AC
  Administered 2020-11-10: 1000 mg via INTRAVENOUS
  Filled 2020-11-10: qty 200

## 2020-11-10 MED ORDER — FUROSEMIDE 10 MG/ML IJ SOLN
40.0000 mg | Freq: Two times a day (BID) | INTRAMUSCULAR | Status: DC
  Administered 2020-11-11 – 2020-11-12 (×3): 40 mg via INTRAVENOUS
  Filled 2020-11-10 (×2): qty 4

## 2020-11-10 MED ORDER — SODIUM CHLORIDE 0.9 % IV SOLN
2.0000 g | Freq: Once | INTRAVENOUS | Status: AC
  Administered 2020-11-10: 2 g via INTRAVENOUS
  Filled 2020-11-10: qty 2

## 2020-11-10 MED ORDER — ONDANSETRON HCL 4 MG PO TABS: 4.0000 mg | ORAL_TABLET | Freq: Four times a day (QID) | ORAL | Status: DC | PRN

## 2020-11-10 MED ORDER — METOPROLOL TARTRATE 50 MG PO TABS
50.0000 mg | ORAL_TABLET | Freq: Two times a day (BID) | ORAL | Status: DC
  Administered 2020-11-10 – 2020-11-12 (×4): 50 mg via ORAL
  Filled 2020-11-10 (×4): qty 1

## 2020-11-10 MED ORDER — PRAVASTATIN SODIUM 20 MG PO TABS
20.0000 mg | ORAL_TABLET | Freq: Every day | ORAL | Status: DC
  Administered 2020-11-10 – 2020-11-16 (×7): 20 mg via ORAL
  Filled 2020-11-10 (×8): qty 1

## 2020-11-10 MED ORDER — SERTRALINE HCL 50 MG PO TABS
100.0000 mg | ORAL_TABLET | Freq: Every day | ORAL | Status: DC
  Administered 2020-11-11 – 2020-11-16 (×6): 100 mg via ORAL
  Filled 2020-11-10 (×6): qty 2

## 2020-11-10 MED ORDER — ACETAMINOPHEN 650 MG RE SUPP: 650.0000 mg | Freq: Four times a day (QID) | RECTAL | Status: DC | PRN

## 2020-11-10 MED ORDER — FUROSEMIDE 10 MG/ML IJ SOLN
40.0000 mg | Freq: Once | INTRAMUSCULAR | Status: AC
  Administered 2020-11-10: 40 mg via INTRAVENOUS
  Filled 2020-11-10: qty 4

## 2020-11-10 NOTE — ED Provider Notes (Signed)
[facility] Emergency Department Provider Note   ____________________________________________   I have reviewed the triage vital signs and the nursing notes.   HISTORY  Chief Complaint Shortness of Breath   History limited by and level 5 caveat due to: Altered mental status. History obtained from granddaughter at bedside.   HPI [name] is a 87 y.o. female who presents to the emergency department today because of concern for shortness of breath. The patient is coming from rehab facility. The patient is unable to give any significant history, history is obtained from family. They state that a few days ago the facility the started giving the patient breathing treatments for wheezing. Since then however the patients breathing has continued to get worse. Additionally the family states that they have noticed the patient has been getting more confused and hallucinating. The patient was in rehab for recent hip fracture and repair. No reported fevers.   Records reviewed. Per medical record review patient has a history of recent hip fracture.  Past Medical History:  Diagnosis Date   Actinic keratosis 01/22/2020   right forearm   Acute deep vein thrombosis (DVT) of distal vein of right lower extremity ([place]) 12/2013   Acute thrombus of R proximal gastrocnemius vein. Symptomatic provoked distal DVT   Anxiety    Atherosclerosis of aorta ([place]) 09/22/2020   Basal cell carcinoma 10/30/2018   R nasal bridge, treated with Moh's   Basal cell carcinoma 01/22/2020   left forearm, nodular   Cellulitis of leg, left 01/16/2014   Cerebral atrophy ([place]) 09/22/2020   Head CT 09/21/20 finding   Cerebral infarct ([place])    Head CT 09/21/20: remote LEFT frontoparietal white matter infarct.   Cerumen impaction 04/16/2013   Saw Dr Richardson Landry 02/2018 s/p cleaning   CHF (congestive heart failure) ([place])    Chronic back pain 03/2012   lumbar DDD with herniation - s/p L S1  transforaminal ESI (10/2012 Dr. Sharlet Salina)   Chronic respiratory failure with hypoxia, on home O2 therapy ([place]) 09/22/2020   Chronic venous insufficiency    Coronary artery disease    CABG 1998   Cystocele    Depression    Diabetes mellitus    type 2, followed by endo.   Diastolic dysfunction    Diverticulitis of duodenum    Diverticulosis    Duodenal ulcer perforation ([place]) 02/08/2019   Erythema of breast 04/18/2018   Hearing loss 09/22/2020   Hemorrhoids    Hip fracture ([place]) 09/05/2020   History of heart attack    History of recurrent UTIs 09/22/2020   Hx of echocardiogram    a. Echo 10/13: mild LVH, EF 55-60%, Gr 1 diast dysfn, PASP 32   Hyperlipidemia    Hypertension    Irritable bowel syndrome    Lethargy 07/27/2019   Microcytic anemia    MRSA cellulitis 03/31/2018   Osteoporosis 09/22/2020   PAD (peripheral artery disease) ([place]) 2013   ABI: R 0.91, L 0.83   Peptic ulcer disease    Pneumonia 02/15/2019   Postmenopausal osteoporosis 01/2019   T score of -2.6   PUD (peptic ulcer disease)    Sphenoid sinusitis 09/21/2020   Head CT 09/21/20: New RIGHT sphenoid sinus fluid/acute sinusitis.   Varicose veins    Venous ulcer of leg ([place]) 12/12/2013   Required prolonged treatment by wound center   Viral gastroenteritis due to [place] virus 05/13/2015   hospitalization    Patient Active Problem List   Diagnosis Date Noted  Hypomagnesemia 10/03/2020   Depression with anxiety 10/01/2020   DVT (deep venous thrombosis) ([place]) 10/01/2020   Acute on chronic diastolic CHF (congestive heart failure) ([place]) 10/01/2020   Atrial fibrillation, chronic ([place]) 09/22/2020   Cerebral atrophy ([place]) 09/22/2020   Atherosclerosis of aorta ([place]) 09/22/2020   Osteoporosis 09/22/2020   Bilirubinemia 09/22/2020   Leukocytosis 09/22/2020   Pyuria 09/22/2020   History of recurrent UTIs 09/22/2020   Hearing loss 09/22/2020   Chronic respiratory failure with hypoxia, on home O2 therapy ([place])  09/22/2020   SIRS (systemic inflammatory response syndrome) (HCC)    Altered mental status, unspecified 09/21/2020   Postoperative anemia due to acute blood loss 09/13/2020   Displaced intertrochanteric fracture of right femur, initial encounter for closed fracture ([place]) 09/05/2020   Hip fracture due to osteoporosis with routine healing 09/05/2020   Financial difficulty 02/26/2020   UTI (urinary tract infection) 10/01/2019   CKD stage 3 due to type 2 diabetes mellitus ([place]) 09/02/2019   History of CVA in adulthood 08/23/2019   Polypharmacy 07/27/2019   E coli bacteremia 02/13/2019   Dyspnea    Acute on chronic respiratory failure with hypoxia and hypercapnia (HCC)    Chronic leg pain 11/14/2018   Atherosclerosis of native arteries of the extremities with ulceration ([place]) 08/21/2018   Chronic constipation 06/18/2018   Advanced directives, counseling/discussion 01/29/2018   Bilateral hearing loss 01/29/2018   Spinal stenosis of lumbosacral region 49/44/9675   Systolic murmur 91/63/8466   De Quervain's tenosynovitis, left 01/09/2017   Encounter for chronic pain management 02/09/2016   Paroxysmal atrial fibrillation (HCC)    Varicose veins of left lower extremity with inflammation, with ulcer of calf limited to breakdown of skin ([place]) 05/22/2015   Varicose veins of right lower extremity with inflammation 01/23/2015   Diabetes mellitus type 2 with peripheral artery disease (HCC)    Chronic venous insufficiency    Bleeding from varicose veins of lower extremity, left 02/13/2014   Diabetic polyneuropathy ([place]) 11/19/2013   Right shoulder pain 08/19/2013   Abdominal pain 05/14/2012   Unsteady gait 12/13/2011   Chronic lower back pain 09/14/2011   Hyperlipidemia associated with type 2 diabetes mellitus ([place]) 09/06/2010   GAD (generalized anxiety disorder) 03/15/2010   Obesity, Class I, BMI 30-34.9 03/15/2010   Hypertension associated with type 2 diabetes mellitus ([place]) 03/15/2010    (HFpEF) heart failure with preserved ejection fraction ([place]) 03/15/2010   Coronary artery disease     Past Surgical History:  Procedure Laterality Date   ABDOMINAL HYSTERECTOMY  04/04/1973   TAH,BSO   ABI  04/05/2011   R 0.91, L 0.83   ANGIOPLASTY Right 09/06/2018   Lucky Cowboy, MD (Vasc): PCTA peroneal a; PCTL SFA; PTLA popliteal a; DES stent right SFR and popliteal a   APPENDECTOMY     per pt report   CARDIOVASCULAR STRESS TEST  04/04/2013   low risk myoview (Nah[place])   CATARACT SURGERY     CHOLECYSTECTOMY     COLONOSCOPY  02/02/2010   extensive diverticulosis throughout, small internal hemorrhoids, rec rpt 5 yrs (Dr. Allyn Kenner)   [place] GRAFT  04/04/1996   dexa scan  09/03/2011   femur -0.8, forearm 0.6 => normal   ENDOVENOUS ABLATION SAPHENOUS VEIN W/ LASER Left 03/04/2014   Kellie Simmering   [facility]  04/04/2012   L S1 transforaminal ESI x2 ([place]) without improvement   FEMORAL ARTERY STENT Right 09/06/2018   Lucky Cowboy, MD (Vasc): PCTA peroneal a; PCTL SFA; PTLA popliteal a; DES stent right SFR  and popliteal a   HAND SURGERY     INTRAMEDULLARY (IM) NAIL INTERTROCHANTERIC Right 09/07/2020   Procedure: INTRAMEDULLARY (IM) NAIL INTERTROCHANTRIC;  Surgeon: Leandrew Koyanagi, MD;  Location: [facility];  Service: Orthopedics;  Laterality: Right;   lexiscan myoview  03/05/2011   Small basal inferolateral and anterolateral reversible perfusion defect suggests ischemia.  EF was normal.   old infarct, no new ischemia   LOWER EXTREMITY ANGIOGRAPHY Right 09/06/2018   Procedure: LOWER EXTREMITY ANGIOGRAPHY;  Surgeon: Algernon Huxley, MD;  Location: [facility];  Service: Cardiovascular;  Laterality: Right;   POPLITEAL ARTERY STENT Right 09/06/2018   Lucky Cowboy, MD (Vasc): PCTA peroneal a; PCTL SFA; PTLA popliteal a; DES stent right SFR and popliteal a   toe surgery     TYMPANOPLASTY  1960s   R side   Vault prolapse A & P repair  04/05/2007   [facility]    Prior to Admission medications    Medication Sig Start Date End Date Taking? Authorizing Provider  acetaminophen (TYLENOL) 325 MG tablet Take 2 tablets (650 mg total) by mouth every 6 (six) hours. 09/25/20   Maness, Arnette Norris, MD  ALPRAZolam Duanne Moron) 0.25 MG tablet Take 1 tablet (0.25 mg total) by mouth 3 (three) times daily as needed for anxiety. 10/06/20   Sharen Hones, MD  alum & mag hydroxide-simeth (MAALOX PLUS) 400-400-40 MG/5ML suspension Take 30 mLs by mouth every 4 (four) hours as needed for indigestion. [place]macone-2    [provider]  B Complex-C (B-COMPLEX WITH VITAMIN C) tablet Take 1 tablet by mouth every morning.    [provider]  bisacodyl (DULCOLAX) 10 MG suppository Place 10 mg rectally daily as needed (constipation not relieved by MOM).    [provider]  diclofenac sodium (VOLTAREN) 1 % GEL Apply 1 application topically 3 (three) times daily. 01/09/17   Ria Bush, MD  ELIQUIS 5 MG TABS tablet TAKE 1 TABLET BY MOUTH TWICE A DAY 04/09/20   Nahser, Wonda Cheng, MD  Ensure (ENSURE) Take 237 mLs by mouth every morning.    [provider]  famotidine (PEPCID) 20 MG tablet Take 1 tablet (20 mg total) by mouth daily as needed for heartburn (breakthrough heartburn). 07/30/19   Ria Bush, MD  fluticasone [facility]) 50 MCG/ACT nasal spray Place 1 spray into both nostrils daily as needed for allergies. 09/23/19   [provider]  furosemide (LASIX) 20 MG tablet Take 2 tablets (40 mg total) by mouth daily. 10/06/20   Sharen Hones, MD  GLUCOSAMINE-CHONDROITIN PO Take 1 tablet by mouth every morning.    [provider]  Lancets [facility] DELICA PLUS [id number]) MISC Use to test blood sugar once daily dx code E11.9 04/08/20   Elayne Snare, MD  melatonin 3 MG TABS tablet Take 1 tablet (3 mg total) by mouth at bedtime. 09/25/20   Maness, Arnette Norris, MD  metoprolol tartrate (LOPRESSOR) 50 MG tablet TAKE 1 TABLET BY MOUTH TWICE A DAY 04/09/20   Ria Bush, MD  Multiple  Vitamins-Minerals (ICAPS) CAPS Take 1 capsule by mouth every morning.    [provider]  nitroGLYCERIN (NITROSTAT) 0.4 MG SL tablet Place 1 tablet (0.4 mg total) under the tongue every 5 (five) minutes as needed for chest pain. 02/02/17   Nahser, Wonda Cheng, MD  ondansetron (ZOFRAN) 4 MG tablet Take 1 tablet (4 mg total) by mouth every 6 (six) hours as needed for nausea. 09/11/20   Delora Fuel, MD  [facility] VERIO test strip  USE TO TEST BLOOD SUGAR ONCE DAILY. DX:E11.9 04/09/20   Elayne Snare, MD  OXYGEN Inhale 2 L into the lungs continuous.    [provider]  pantoprazole (PROTONIX) 40 MG tablet TAKE 1 TABLET BY MOUTH TWICE A DAY 04/09/20   Ria Bush, MD  polyethylene glycol powder (GLYCOLAX/MIRALAX) 17 GM/SCOOP powder Dissolve 1 capful (17 g) in water and drink daily. 09/26/20   Maness, Arnette Norris, MD  potassium chloride (MICRO-K) 10 MEQ CR capsule TAKE ONE CAPSULE BY MOUTH ONCE DAILY 08/21/20   Ria Bush, MD  pravastatin (PRAVACHOL) 20 MG tablet Take 1 tablet (20 mg total) by mouth daily. 05/27/20   Ria Bush, MD  Probiotic Product (ALIGN) 4 MG CAPS Take 1 capsule (4 mg total) by mouth daily. 08/15/17   Ria Bush, MD  sertraline (ZOLOFT) 100 MG tablet Take 1 tablet (100 mg total) by mouth daily. 09/26/20   Maness, Arnette Norris, MD  sitaGLIPtin (JANUVIA) 50 MG tablet Take 50 mg by mouth every morning.    [provider]  Sodium Phosphates (RA SALINE ENEMA RE) Place 1 enema rectally daily as needed (constipation not relieved by Bisacodyl suppository).    [provider]    Allergies Metformin and related  Family History  Problem Relation Age of Onset   Hypertension Mother    Stroke Mother    Diabetes Father    Diabetes Sister    Diabetes Brother    Heart disease Son    Colon cancer Neg Hx     Social History Social History   Tobacco Use   Smoking status: Former    Types: Cigarettes    Quit date: 03/05/1979    Years since quitting:  41.7   Smokeless tobacco: Never  Vaping Use   Vaping Use: Never used  Substance Use Topics   Alcohol use: No    Alcohol/week: 0.0 standard drinks   Drug use: No    Review of Systems Unable to obtain reliable ROS secondary to ams.   ____________________________________________   PHYSICAL EXAM:  VITAL SIGNS: ED Triage Vitals  Enc Vitals Group     BP 11/10/20 1155 (!) 139/93     Pulse Rate 11/10/20 1155 (!) 106     Resp 11/10/20 1155 18     Temp 11/10/20 1155 98.2 F (36.8 C)     Temp Source 11/10/20 1155 Oral     SpO2 11/10/20 1155 95 %     Weight 11/10/20 1156 126 lb 15.8 oz (57.6 kg)     Height 11/10/20 1156 4\' 9"  (1.448 m)     Head Circumference --      Peak Flow --      Pain Score 11/10/20 1156 0   Constitutional: Awake and alert. Not oriented.  Eyes: Conjunctivae are normal.  ENT      Head: Normocephalic and atraumatic.      Nose: No congestion/rhinnorhea.      Mouth/Throat: Mucous membranes are moist.      Neck: No stridor. Hematological/Lymphatic/Immunilogical: No cervical lymphadenopathy. Cardiovascular: Normal rate, regular rhythm.  No murmurs, rubs, or gallops.  Respiratory: Increased respiratory effort. Clear lungs.  Gastrointestinal: Soft and non tender. No rebound. No guarding.  Genitourinary: Deferred Musculoskeletal: Normal range of motion in all extremities. No lower extremity edema. Neurologic:  Disoriented. Moving all extremities.  Skin:  Skin is warm, dry and intact. No rash noted. Psychiatric: Hallucinating.   ____________________________________________    LABS (pertinent positives/negatives)  CBC wbc 9.5, hgb 10.1, plt 256 BMP wnl except  glu 244, bun 30 Trop hs 21  ____________________________________________   EKG  I, Nance Pear, attending physician, personally viewed and interpreted this EKG  EKG Time: 1203 Rate: 96 Rhythm: atrial fibrillation Axis: normal Intervals: qtc 444 QRS: narrow ST changes: no st  elevation Impression: abnormal ekg ____________________________________________    RADIOLOGY  CXR Cardiomegaly with edema, left lung base opacity  ____________________________________________   PROCEDURES  Procedures  ____________________________________________   INITIAL IMPRESSION / ASSESSMENT AND PLAN / ED COURSE  Pertinent labs & imaging results that were available during my care of the patient were reviewed by me and considered in my medical decision making (see chart for details).   Patient presented to the emergency department today because of concern for increased oxygen requirement from rehab facility. CXR was concerning for possible pneumonia so broad spectrum antibiotics were initiated. Given that she is in rehab facility after hip fracture/repair did have concern for possible PE so CT angio was obtained. While this did not show PE there was concern for possible edema. Because of this the patient was given lasix. Will plan on admission.  ____________________________________________   FINAL CLINICAL IMPRESSION(S) / ED DIAGNOSES  Final diagnoses:  Hypoxia  Shortness of breath     Note: This dictation was prepared with Dragon dictation. Any transcriptional errors that result from this process are unintentional     Nance Pear, MD 11/10/20 2016

## 2020-11-10 NOTE — H&P (Signed)
History and Physical    [name] UKG:[id number] DOB: Aug 02, 1927 DOA: 11/10/2020  PCP: Ria Bush, MD   Patient coming from: home  I have personally briefly reviewed patient's old medical records in [place]  Chief Complaint: shortness of breath  HPI: [name] is a 87 y.o. female with medical history significant for Systolic CHF (EF 40 to 76% 7/22), A. fib on Eliquis, chronic respiratory failure on home O2 at 2 L, HTN, DM, history of CVA, PUD with history of perforated ulcer, CAD s/p CABG, hospitalized from 6/30-7/5 with respiratory failure secondary to CHF exacerbation who again presents from SNF, with shortness of breath requiring O2 in excess of daily requirement.  Patient started having shortness of breath about a week prior which progressively worsened and did not improve with breathing treatments administered by the facility.  Additionally they noted that she was getting more confused.  She has had no complaints of chest pain, no cough or fevers.  No reports of abdominal pain, nausea or vomiting or diarrhea  ED course: On arrival temp 98.2, BP 139/93, pulse 106 O2 89% on 4 L, currently at 6 L at time of admission Blood work troponin 21-22, no BNP.  WBC normal, hemoglobin 10.1.  Glucose 244 with BMP otherwise unremarkable  EKG, personally viewed and interpreted: Sinus at 96 with nonspecific ST-T wave changes  Imaging: Chest x-ray with cardiomegaly and interstitial edema.  Superimposed mild and defined opacity within the left lung base favoring atelectasis cannot rule out pneumonia trace bilateral pleural effusions CTA chest negative for PE  Patient treated with IV Lasix, started on cefepime and vancomycin for possible pneumonia.  Hospitalist consulted for admission.  Review of Systems: Unable to obtain due to somnolence and clinical condition  Past Medical History:  Diagnosis Date   Actinic keratosis 01/22/2020   right forearm   Acute deep vein  thrombosis (DVT) of distal vein of right lower extremity ([place]) 12/2013   Acute thrombus of R proximal gastrocnemius vein. Symptomatic provoked distal DVT   Anxiety    Atherosclerosis of aorta ([place]) 09/22/2020   Basal cell carcinoma 10/30/2018   R nasal bridge, treated with Moh's   Basal cell carcinoma 01/22/2020   left forearm, nodular   Cellulitis of leg, left 01/16/2014   Cerebral atrophy ([place]) 09/22/2020   Head CT 09/21/20 finding   Cerebral infarct ([place])    Head CT 09/21/20: remote LEFT frontoparietal white matter infarct.   Cerumen impaction 04/16/2013   Saw Dr Richardson Landry 02/2018 s/p cleaning   CHF (congestive heart failure) ([place])    Chronic back pain 03/2012   lumbar DDD with herniation - s/p L S1 transforaminal ESI (10/2012 Dr. Sharlet Salina)   Chronic respiratory failure with hypoxia, on home O2 therapy ([place]) 09/22/2020   Chronic venous insufficiency    Coronary artery disease    CABG 1998   Cystocele    Depression    Diabetes mellitus    type 2, followed by endo.   Diastolic dysfunction    Diverticulitis of duodenum    Diverticulosis    Duodenal ulcer perforation ([place]) 02/08/2019   Erythema of breast 04/18/2018   Hearing loss 09/22/2020   Hemorrhoids    Hip fracture ([place]) 09/05/2020   History of heart attack    History of recurrent UTIs 09/22/2020   Hx of echocardiogram    a. Echo 10/13: mild LVH, EF 55-60%, Gr 1 diast dysfn, PASP 32   Hyperlipidemia    Hypertension    Irritable  bowel syndrome    Lethargy 07/27/2019   Microcytic anemia    MRSA cellulitis 03/31/2018   Osteoporosis 09/22/2020   PAD (peripheral artery disease) ([place]) 2013   ABI: R 0.91, L 0.83   Peptic ulcer disease    Pneumonia 02/15/2019   Postmenopausal osteoporosis 01/2019   T score of -2.6   PUD (peptic ulcer disease)    Sphenoid sinusitis 09/21/2020   Head CT 09/21/20: New RIGHT sphenoid sinus fluid/acute sinusitis.   Varicose veins    Venous ulcer of leg ([place]) 12/12/2013   Required prolonged  treatment by wound center   Viral gastroenteritis due to [place] virus 05/13/2015   hospitalization    Past Surgical History:  Procedure Laterality Date   ABDOMINAL HYSTERECTOMY  04/04/1973   TAH,BSO   ABI  04/05/2011   R 0.91, L 0.83   ANGIOPLASTY Right 09/06/2018   Lucky Cowboy, MD (Vasc): PCTA peroneal a; PCTL SFA; PTLA popliteal a; DES stent right SFR and popliteal a   APPENDECTOMY     per pt report   CARDIOVASCULAR STRESS TEST  04/04/2013   low risk myoview (N[place])   CATARACT SURGERY     CHOLECYSTECTOMY     COLONOSCOPY  02/02/2010   extensive diverticulosis throughout, small internal hemorrhoids, rec rpt 5 yrs (Dr. Allyn Kenner)   [place] GRAFT  04/04/1996   dexa scan  09/03/2011   femur -0.8, forearm 0.6 => normal   ENDOVENOUS ABLATION SAPHENOUS VEIN W/ LASER Left 03/04/2014   Kellie Simmering   [facility]  04/04/2012   L S1 transforaminal ESI x2 ([place]) without improvement   FEMORAL ARTERY STENT Right 09/06/2018   Lucky Cowboy, MD (Vasc): PCTA peroneal a; PCTL SFA; PTLA popliteal a; DES stent right SFR and popliteal a   HAND SURGERY     INTRAMEDULLARY (IM) NAIL INTERTROCHANTERIC Right 09/07/2020   Procedure: INTRAMEDULLARY (IM) NAIL INTERTROCHANTRIC;  Surgeon: Leandrew Koyanagi, MD;  Location: [facility];  Service: Orthopedics;  Laterality: Right;   lexiscan myoview  03/05/2011   Small basal inferolateral and anterolateral reversible perfusion defect suggests ischemia.  EF was normal.   old infarct, no new ischemia   LOWER EXTREMITY ANGIOGRAPHY Right 09/06/2018   Procedure: LOWER EXTREMITY ANGIOGRAPHY;  Surgeon: Algernon Huxley, MD;  Location: [facility];  Service: Cardiovascular;  Laterality: Right;   POPLITEAL ARTERY STENT Right 09/06/2018   Lucky Cowboy, MD (Vasc): PCTA peroneal a; PCTL SFA; PTLA popliteal a; DES stent right SFR and popliteal a   toe surgery     TYMPANOPLASTY  1960s   R side   Vault prolapse A & P repair  04/05/2007   Dr Rhodia Albright [facility] hospital     reports that she quit smoking  about 41 years ago. Her smoking use included cigarettes. She has never used smokeless tobacco. She reports that she does not drink alcohol and does not use drugs.  Allergies  Allergen Reactions   Metformin And Related Diarrhea    Family History  Problem Relation Age of Onset   Hypertension Mother    Stroke Mother    Diabetes Father    Diabetes Sister    Diabetes Brother    Heart disease Son    Colon cancer Neg Hx       Prior to Admission medications   Medication Sig Start Date End Date Taking? Authorizing Provider  acetaminophen (TYLENOL) 325 MG tablet Take 2 tablets (650 mg total) by mouth every 6 (six) hours. 09/25/20  Yes Maness, Arnette Norris, MD  ALPRAZolam Duanne Moron) 0.25  MG tablet Take 1 tablet (0.25 mg total) by mouth 3 (three) times daily as needed for anxiety. Patient taking differently: Take 0.25 mg by mouth 2 (two) times daily as needed for anxiety. 10/06/20  Yes Sharen Hones, MD  alum & mag hydroxide-simeth (MAALOX PLUS) 400-400-40 MG/5ML suspension Take 30 mLs by mouth every 4 (four) hours as needed for indigestion.   Yes [provider]  B Complex-C (B-COMPLEX WITH VITAMIN C) tablet Take 1 tablet by mouth every morning.   Yes [provider]  bisacodyl (DULCOLAX) 10 MG suppository Place 10 mg rectally daily as needed (constipation not relieved by MOM).   Yes [provider]  diclofenac sodium (VOLTAREN) 1 % GEL Apply 1 application topically 3 (three) times daily. Patient taking differently: Apply 4 g topically 3 (three) times daily. 01/09/17  Yes Ria Bush, MD  diltiazem (CARDIZEM SR) 60 MG 12 hr capsule Take 60 mg by mouth 2 (two) times daily.   Yes [provider]  ELIQUIS 5 MG TABS tablet TAKE 1 TABLET BY MOUTH TWICE A DAY 04/09/20  Yes Nahser, Wonda Cheng, MD  famotidine (PEPCID) 20 MG tablet Take 1 tablet (20 mg total) by mouth daily as needed for heartburn (breakthrough heartburn). 07/30/19  Yes Ria Bush, MD  fluticasone [facility])  50 MCG/ACT nasal spray Place 1 spray into both nostrils daily as needed for allergies. 09/23/19  Yes [provider]  furosemide (LASIX) 20 MG tablet Take 2 tablets (40 mg total) by mouth daily. 10/06/20  Yes Sharen Hones, MD  GLUCOSAMINE-CHONDROITIN PO Take 1 tablet by mouth every morning.   Yes [provider]  Lancets [facility] DELICA PLUS [id number]) MISC Use to test blood sugar once daily dx code E11.9 04/08/20  Yes Elayne Snare, MD  melatonin 3 MG TABS tablet Take 1 tablet (3 mg total) by mouth at bedtime. 09/25/20  Yes Maness, Arnette Norris, MD  metoprolol tartrate (LOPRESSOR) 50 MG tablet TAKE 1 TABLET BY MOUTH TWICE A DAY 04/09/20  Yes Ria Bush, MD  Multiple Vitamins-Minerals (ICAPS) CAPS Take 1 capsule by mouth every morning.   Yes [provider]  nitroGLYCERIN (NITROSTAT) 0.4 MG SL tablet Place 1 tablet (0.4 mg total) under the tongue every 5 (five) minutes as needed for chest pain. 02/02/17  Yes Nahser, Wonda Cheng, MD  ondansetron (ZOFRAN) 4 MG tablet Take 1 tablet (4 mg total) by mouth every 6 (six) hours as needed for nausea. 09/11/20  Yes Maness, Arnette Norris, MD  [facility] VERIO test strip USE TO TEST BLOOD SUGAR ONCE DAILY. DX:E11.9 04/09/20  Yes Elayne Snare, MD  pantoprazole (PROTONIX) 40 MG tablet TAKE 1 TABLET BY MOUTH TWICE A DAY 04/09/20  Yes Ria Bush, MD  pioglitazone (ACTOS) 15 MG tablet Take 15 mg by mouth daily.   Yes [provider]  polyethylene glycol powder (GLYCOLAX/MIRALAX) 17 GM/SCOOP powder Dissolve 1 capful (17 g) in water and drink daily. 09/26/20  Yes Maness, Arnette Norris, MD  potassium chloride (MICRO-K) 10 MEQ CR capsule TAKE ONE CAPSULE BY MOUTH ONCE DAILY 08/21/20  Yes Ria Bush, MD  pravastatin (PRAVACHOL) 20 MG tablet Take 1 tablet (20 mg total) by mouth daily. 05/27/20  Yes Ria Bush, MD  Probiotic Product (ALIGN) 4 MG CAPS Take 1 capsule (4 mg total) by mouth daily. 08/15/17  Yes Ria Bush, MD  sertraline (ZOLOFT) 100  MG tablet Take 1 tablet (100 mg total) by mouth daily. 09/26/20  Yes Maness, Arnette Norris, MD  sitaGLIPtin (JANUVIA) 50 MG tablet Take 50 mg  by mouth every morning.   Yes [provider]  Sodium Phosphates (RA SALINE ENEMA RE) Place 1 enema rectally daily as needed (constipation not relieved by Bisacodyl suppository).   Yes [provider]  Ensure (ENSURE) Take 237 mLs by mouth every morning.    [provider]  OXYGEN Inhale 2 L into the lungs continuous.    [provider]    Physical Exam: Vitals:   11/10/20 1730 11/10/20 1830 11/10/20 1900 11/10/20 1930  BP: (!) 145/86 127/79 114/69 125/75  Pulse: (!) 112 (!) 112 (!) 111 (!) 113  Resp: 18 19 19  (!) 23  Temp:      TempSrc:      SpO2: 100% 100% 100% 100%  Weight:      Height:         Vitals:   11/10/20 1730 11/10/20 1830 11/10/20 1900 11/10/20 1930  BP: (!) 145/86 127/79 114/69 125/75  Pulse: (!) 112 (!) 112 (!) 111 (!) 113  Resp: 18 19 19  (!) 23  Temp:      TempSrc:      SpO2: 100% 100% 100% 100%  Weight:      Height:          Constitutional: Somnolent, confused, not participating with answering questions.   HEENT:      Head: Normocephalic and atraumatic.         Eyes: PERLA, EOMI, Conjunctivae are normal. Sclera is non-icteric.       Mouth/Throat: Mucous membranes are moist.       Neck: Supple with no signs of meningismus. Cardiovascular: Tachycardia. No murmurs, gallops, or rubs. 2+ symmetrical distal pulses are present . No JVD.  Trace LE edema Respiratory: Respiratory effort increased.Lungs sounds with faint crackles bibasilarly Gastrointestinal: Soft, non tender, and non distended with positive bowel sounds.  Genitourinary: No CVA tenderness. Musculoskeletal: Nontender with normal range of motion in all extremities. No cyanosis, or erythema of extremities. Neurologic:  Face is symmetric. Moving all extremities. No gross focal neurologic deficits . Skin: Skin is warm, dry.  No rash or  ulcers Psychiatric: Confused and unable to assess   Labs on Admission: I have personally reviewed following labs and imaging studies  CBC: Recent Labs  Lab 11/10/20 1209  WBC 9.5  HGB 10.1*  HCT 33.2*  MCV 97.1  PLT 993   Basic Metabolic Panel: Recent Labs  Lab 11/10/20 1209  NA 141  K 4.4  CL 105  CO2 29  GLUCOSE 244*  BUN 30*  CREATININE 0.79  CALCIUM 9.3   GFR: Estimated Creatinine Clearance: 33.4 mL/min (by C-G formula based on SCr of 0.79 mg/dL). Liver Function Tests: No results for input(s): AST, ALT, ALKPHOS, BILITOT, PROT, ALBUMIN in the last 168 hours. No results for input(s): LIPASE, AMYLASE in the last 168 hours. No results for input(s): AMMONIA in the last 168 hours. Coagulation Profile: No results for input(s): INR, PROTIME in the last 168 hours. Cardiac Enzymes: No results for input(s): CKTOTAL, CKMB, CKMBINDEX, TROPONINI in the last 168 hours. BNP (last 3 results) No results for input(s): PROBNP in the last 8760 hours. HbA1C: No results for input(s): HGBA1C in the last 72 hours. CBG: No results for input(s): GLUCAP in the last 168 hours. Lipid Profile: No results for input(s): CHOL, HDL, LDLCALC, TRIG, CHOLHDL, LDLDIRECT in the last 72 hours. Thyroid Function Tests: No results for input(s): TSH, T4TOTAL, [name]4, T3FREE, THYROIDAB in the last 72 hours. Anemia Panel: No results for input(s): VITAMINB12, FOLATE, FERRITIN, TIBC,  IRON, RETICCTPCT in the last 72 hours. Urine analysis:    Component Value Date/Time   COLORURINE YELLOW 09/21/2020 1500   APPEARANCEUR HAZY (A) 09/21/2020 1500   LABSPEC 1.011 09/21/2020 1500   PHURINE 6.0 09/21/2020 1500   GLUCOSEU NEGATIVE 09/21/2020 1500   GLUCOSEU NEGATIVE 02/19/2020 1332   HGBUR NEGATIVE 09/21/2020 1500   BILIRUBINUR NEGATIVE 09/21/2020 1500   BILIRUBINUR negative 06/10/2020 1613   KETONESUR NEGATIVE 09/21/2020 1500   PROTEINUR NEGATIVE 09/21/2020 1500   UROBILINOGEN 0.2 06/10/2020 1613    UROBILINOGEN 0.2 02/19/2020 1332   NITRITE NEGATIVE 09/21/2020 1500   LEUKOCYTESUR MODERATE (A) 09/21/2020 1500    Radiological Exams on Admission: DG Chest 2 View  Result Date: 11/10/2020 CLINICAL DATA:  SOB. Additional provided: Patient reports history of CHF. EXAM: CHEST - 2 VIEW COMPARISON:  CT angiogram chest 10/01/2020. Prior chest radiographs 10/01/2020 and earlier. FINDINGS: Prior median sternotomy/CABG. Unchanged cardiomegaly. Aortic atherosclerosis. Bilateral interstitial prominence, likely reflecting interstitial edema. Superimposed mild ill-defined opacity at the left lung base favored to reflect atelectasis. Trace bilateral pleural effusions. No appreciable pneumothorax. No evidence of acute bony abnormality. A curvilinear density projects over the right clavicle, likely overlying the patient. IMPRESSION: Cardiomegaly with interstitial edema. Superimposed mild ill-defined opacity within the left lung base, favored to reflect atelectasis. Pneumonia cannot be excluded and clinical correlation is recommended. Trace bilateral pleural effusions. Aortic Atherosclerosis (ICD10-I70.0). Electronically Signed   By: Kellie Simmering DO   On: 11/10/2020 13:47   CT Angio Chest PE W and/or Wo Contrast  Result Date: 11/10/2020 CLINICAL DATA:  Short of breath. EXAM: CT ANGIOGRAPHY CHEST WITH CONTRAST TECHNIQUE: Multidetector CT imaging of the chest was performed using the standard protocol during bolus administration of intravenous contrast. Multiplanar CT image reconstructions and MIPs were obtained to evaluate the vascular anatomy. CONTRAST:  70mL OMNIPAQUE IOHEXOL 350 MG/ML SOLN COMPARISON:  CT 10/01/2020. FINDINGS: Cardiovascular: No filling defects within the pulmonary arteries to suggest acute pulmonary embolism. Mediastinum/Nodes: Small mediastinal lymph nodes. No change from prior. No pericardial fluid. Post CABG anatomy Lungs/Pleura: Moderate bilateral pleural effusions slightly improved from comparison  exam. Mild ground-glass opacities in the upper lobes. Improvement in airspace disease compared to prior. No focal consolidation. No pulmonary infarction. Upper Abdomen: Limited view of the liver, kidneys, pancreas are unremarkable. Normal adrenal glands. Musculoskeletal: No aggressive osseous lesion. Review of the MIP images confirms the above findings. IMPRESSION: 1. No evidence acute pulmonary embolism. 2. Bilateral pleural effusions and mild pulmonary edema improved from comparison exam. Electronically Signed   By: Suzy Bouchard M.D.   On: 11/10/2020 17:45     Assessment/Plan 87 year old female with history of systolic CHF (EF 40 to 03% 7/22), A. fib on Eliquis, chronic respiratory failure on home O2 at 2 L, HTN, DM, history of CVA, PUD with history of perforated ulcer, CAD s/p CABG, hospitalized from 6/30-7/5 with respiratory failure secondary to CHF exacerbation who again presents from SNF, with shortness of breath requiring O2 in excess of daily requirement.      Acute on chronic respiratory failure with hypoxia (HCC) -Patient presenting with shortness of breath, tachypnea, O2 sats in the 80s on home flow rate of 2 L and with increased work of breathing, requiring 6 L to maintain sats in the mid 90s - Secondary to CHF exacerbation - Interstitial edema seen on chest x-ray and CTA chest negative for acute PE - Physical exam not consistent with COPD and no history of same - COVID and flu negative - Follow procalcitonin  to evaluate for pneumonia - Patient was given cefepime and vancomycin in the ED for possible pneumonia on chest x-ray.  Will not continue antibiotics for right now pending procalcitonin - Supplemental O2 to keep sats over 92%  Acute on chronic systolic CHF - Chest x-ray with interstitial edema - BNP pending - Overall appears euvolemic - IV Lasix - Continue metoprolol - Daily weights with intake and output monitoring    Paroxysmal atrial fibrillation (HCC)   Chronic  anticoagulation - Currently in sinus - Continue diltiazem, Eliquis    Coronary artery disease s/p CABG - Continue metoprolol, pravastatin    GAD (generalized anxiety disorder) - Continue Zoloft and alprazolam    Diabetes mellitus type 2 with hyperglycemia - Blood sugar 244 - Sliding scale insulin coverage     DVT prophylaxis: Eliquis Code Status: DNR Family Communication:  none  Disposition Plan: Back to previous home environment Consults called: none  Status:At the time of admission, it appears that the appropriate admission status for this patient is INPATIENT. This is judged to be reasonable and necessary in order to provide the required intensity of service to ensure the patient's safety given the presenting symptoms, physical exam findings, and initial radiographic and laboratory data in the context of their  Comorbid conditions.   Patient requires inpatient status due to high intensity of service, high risk for further deterioration and high frequency of surveillance required.   I certify that at the point of admission it is my clinical judgment that the patient will require inpatient hospital care spanning beyond [place] MD [facility]     11/10/2020, 8:39 PM

## 2020-11-10 NOTE — ED Triage Notes (Signed)
Pt here from [facility] with SOB. Pt on 4L BNC with sats at 89%. Pt placed on 6L with sats at 64%. Pt does not remember how long she has been SOB but mentions that she has felt like this since she broke her hip. Pt also says she has CHF.

## 2020-11-10 NOTE — ED Notes (Signed)
Patient taken to [facility].  Oxygen tank changed out.  Remains on 4l/ Long  patient companion from [facility] remains with patient.

## 2020-11-11 LAB — BASIC METABOLIC PANEL
Anion gap: 12 (ref 5–15)
BUN: 17 mg/dL (ref 8–23)
CO2: 34 mmol/L — ABNORMAL HIGH (ref 22–32)
Calcium: 8.9 mg/dL (ref 8.9–10.3)
Chloride: 97 mmol/L — ABNORMAL LOW (ref 98–111)
Creatinine, Ser: 0.64 mg/dL (ref 0.44–1.00)
GFR, Estimated: >60 mL/min (ref >60)
Glucose, Bld: 148 mg/dL — ABNORMAL HIGH (ref 70–99)
Potassium: 3.5 mmol/L (ref 3.5–5.1)
Sodium: 143 mmol/L (ref 135–145)

## 2020-11-11 LAB — GLUCOSE, CAPILLARY: Glucose-Capillary: 182 mg/dL — ABNORMAL HIGH (ref 70–99)

## 2020-11-11 LAB — CBC
HCT: 32.8 % — ABNORMAL LOW (ref 36.0–46.0)
Hemoglobin: 10.3 g/dL — ABNORMAL LOW (ref 12.0–15.0)
MCH: 30.5 pg (ref 26.0–34.0)
MCHC: 31.4 g/dL (ref 30.0–36.0)
MCV: 97 fL (ref 80.0–100.0)
Platelets: 199 10*3/uL (ref 150–400)
RBC: 3.38 MIL/uL — ABNORMAL LOW (ref 3.87–5.11)
RDW: 18.4 % — ABNORMAL HIGH (ref 11.5–15.5)
WBC: 9.9 10*3/uL (ref 4.0–10.5)
nRBC: 0 % (ref 0.0–0.2)

## 2020-11-11 LAB — CBG MONITORING, ED: Glucose-Capillary: 179 mg/dL — ABNORMAL HIGH (ref 70–99)

## 2020-11-11 MED ORDER — APIXABAN 2.5 MG PO TABS
2.5000 mg | ORAL_TABLET | Freq: Two times a day (BID) | ORAL | Status: DC
  Administered 2020-11-11 – 2020-11-16 (×10): 2.5 mg via ORAL
  Filled 2020-11-11 (×11): qty 1

## 2020-11-11 MED ORDER — DILTIAZEM HCL ER 60 MG PO CP12
60.0000 mg | ORAL_CAPSULE | Freq: Three times a day (TID) | ORAL | Status: DC
  Administered 2020-11-11: 60 mg via ORAL
  Filled 2020-11-11 (×5): qty 1

## 2020-11-11 MED ORDER — SODIUM CHLORIDE 0.9 % IV BOLUS
250.0000 mL | Freq: Once | INTRAVENOUS | Status: AC
  Administered 2020-11-11: 250 mL via INTRAVENOUS

## 2020-11-11 MED ORDER — LINAGLIPTIN 5 MG PO TABS
5.0000 mg | ORAL_TABLET | Freq: Every day | ORAL | Status: DC
  Administered 2020-11-11 – 2020-11-15 (×5): 5 mg via ORAL
  Filled 2020-11-11 (×5): qty 1

## 2020-11-11 MED ORDER — INSULIN ASPART 100 UNIT/ML IJ SOLN: 0.0000 [IU] | Freq: Every day | INTRAMUSCULAR | Status: DC

## 2020-11-11 MED ORDER — INSULIN ASPART 100 UNIT/ML IJ SOLN
0.0000 [IU] | Freq: Three times a day (TID) | INTRAMUSCULAR | Status: DC
  Administered 2020-11-11 – 2020-11-12 (×2): 2 [IU] via SUBCUTANEOUS
  Administered 2020-11-12: 5 [IU] via SUBCUTANEOUS
  Administered 2020-11-13 (×3): 2 [IU] via SUBCUTANEOUS
  Administered 2020-11-14: 1 [IU] via SUBCUTANEOUS
  Administered 2020-11-14: 3 [IU] via SUBCUTANEOUS
  Administered 2020-11-14: 5 [IU] via SUBCUTANEOUS
  Administered 2020-11-15: 3 [IU] via SUBCUTANEOUS
  Filled 2020-11-11 (×9): qty 1

## 2020-11-11 NOTE — Progress Notes (Signed)
PROGRESS NOTE  [name]  DOB: 1928-02-06  PCP: Ria Bush, MD IRW:[id number]  DOA: 11/10/2020  LOS: 1 day  Hospital Day: 2   Chief Complaint  Patient presents with   Shortness of Breath    Brief narrative: [name] is a 87 y.o. female with PMH significant for DM2, HTN, CAD/CABG, systolic CHF (EF 40 to 76% 7/22), A. fib on Eliquis, chronic respiratory failure on home O2 at 2 L, CVA, PUD with history of perforated ulcer, who was hospitalized from 6/30-7/5 with respiratory failure secondary to CHF exacerbation.   Patient was brought to the ED on 8/9 from [place] with complaint of shortness of breath with worsening oxygen requirement.,  Progressively worsening for a week that did not improve with breathing treatments.   In the ED, patient required 6 L of supplemental oxygen. Chest x-ray showed cardiomegaly and interstitial edema CT angio chest negative for pulm embolism also showed ill-defined opacities in the left lung base Patient was started on IV Lasix, broad-spectrum IV antibiotics Admitted to hospitalist service.  Subjective: Patient was seen and examined this morning.  Pleasant elderly Caucasian female.  Hard of hearing.  Oriented to place.  Later in the day, saw the patient again and discussed with her granddaughter at bedside.  Assessment/Plan: Acute on chronic respiratory failure with hypoxia -Primarily due to exacerbation of congestive heart failure. -Initially pneumonia was suspected but no fever, normal WBC count, normal procalcitonin.  Pneumonia ruled out.  No need of antibiotic treatment -On 2 L oxygen at baseline.  Currently requiring 6 L by nasal cannula. -Management of CHF as below.  Wean down oxygen supplementation as tolerated. Recent Labs  Lab 11/10/20 1209 11/10/20 2011 11/11/20 0657  WBC 9.5  --  9.9  PROCALCITON  --  <0.10  --    Acute exacerbation of chronic systolic CHF -Presented with shortness of breath elevated  BNP, chest x-ray with interstitial edema -Recent echo from 7/22 with EF 40 to 45%. -Home meds include metoprolol 50 mg twice daily, Lasix 40 mg daily -Continue metoprolol.  Currently on IV Lasix 40 mg twice daily. -Net IO Since Admission: No IO data has been entered for this period [11/11/20 1641] -Continue to monitor for daily intake output, weight, blood pressure, BNP, renal function and electrolytes. Recent Labs  Lab 11/10/20 1209 11/11/20 0657  BNP 693.8*  --   BUN 30* 17  CREATININE 0.79 0.64  K 4.4 3.5   A. fib with RVR  -History of paroxysmal A. Fib on Cardizem 60 mg twice daily and Eliquis. -Heart rate does not seem controlled.  I will increase to Cardizem 60 mg 3 times daily -Continue Eliquis for anticoagulation.  Acute metabolic encephalopathy -Family reports clear mental status prior to her fall and fracture in June.  Since then, she has had periods of confusion.  She had confusion prior to this admission as well probably due to hypoxia and progressive physical decline.  Currently oriented to place only.  Not restless or agitated.  Continue to monitor.  Coronary artery disease s/p CABG -Continue metoprolol, pravastatin  Type 2 diabetes mellitus Hyperglycemia -A1c 7.3 on 08/28/2020.  Blood sugar level was elevated to 244 on admission. -Home meds include Januvia 50 mg daily, Actos 15 mg daily -Start sliding scale insulin with Accu-Cheks -Resume Januvia.  Keep Actos on hold because of coexisting CHF. No results for input(s): GLUCAP in the last 168 hours.  GAD (generalized anxiety disorder) -Continue Zoloft and alprazolam  Mobility: Encourage ambulation Code Status:   Code Status: DNR.  Discussed with patient's granddaughter at bedside. Nutritional status: Body mass index is 27.48 kg/m.     Diet:  Diet Order             Diet heart healthy/carb modified Room service appropriate? Yes; Fluid consistency: Thin  Diet effective now                  DVT  prophylaxis:  apixaban (ELIQUIS) tablet 2.5 mg Start: 11/11/20 2200 apixaban (ELIQUIS) tablet 2.5 mg   Antimicrobials: None Fluid: None Consultants: None Family Communication: Discussed with patient's granddaughter at bedside  Status is: Inpatient  Remains inpatient appropriate because: Needs IV diuresis  Dispo: The patient is from: [facility]              Anticipated d/c is to: Back to the facility in 2 to 3 days              Patient currently is not medically stable to d/c.   Difficult to place patient No     Infusions:    Scheduled Meds:  apixaban  2.5 mg Oral BID   diclofenac Sodium  4 g Topical TID   diltiazem  60 mg Oral TID   furosemide  40 mg Intravenous Q12H   insulin aspart  0-5 Units Subcutaneous QHS   insulin aspart  0-9 Units Subcutaneous TID WC   linagliptin  5 mg Oral Daily   melatonin  2.5 mg Oral QHS   metoprolol tartrate  50 mg Oral BID   pantoprazole  40 mg Oral BID   pravastatin  20 mg Oral Daily   sertraline  100 mg Oral Daily    Antimicrobials: Anti-infectives (From admission, onward)    Start     Dose/Rate Route Frequency Ordered Stop   11/10/20 1700  ceFEPIme (MAXIPIME) 2 g in sodium chloride 0.9 % 100 mL IVPB        2 g 200 mL/hr over 30 Minutes Intravenous  Once 11/10/20 1652 11/10/20 1748   11/10/20 1700  vancomycin (VANCOCIN) IVPB 1000 mg/200 mL premix        1,000 mg 200 mL/hr over 60 Minutes Intravenous  Once 11/10/20 1652 11/10/20 1943       PRN meds: acetaminophen **OR** acetaminophen, ALPRAZolam, HYDROcodone-acetaminophen, nitroGLYCERIN, ondansetron **OR** ondansetron (ZOFRAN) IV, polyethylene glycol   Objective: Vitals:   11/11/20 1209 11/11/20 1334  BP: 121/69 119/70  Pulse: (!) 114 (!) 116  Resp: (!) 29 (!) 22  Temp:    SpO2: 98% 96%   No intake or output data in the 24 hours ending 11/11/20 1641 Filed Weights   11/10/20 1156  Weight: 57.6 kg   Weight change:  Body mass index is 27.48 kg/m.   Physical  Exam: General exam: Pleasant, elderly Caucasian female.  Not in distress Skin: No rashes, lesions or ulcers. HEENT: Atraumatic, normocephalic, no obvious bleeding Lungs: Clear to auscultation bilaterally CVS: A. fib with RVR GI/Abd soft, nontender, nondistended, bowel sound present CNS: Alert, awake, hard of hearing.  Oriented to place Psychiatry: Mood appropriate Extremities: No pedal edema, no calf redness  Data Review: I have personally reviewed the laboratory data and studies available.  Recent Labs  Lab 11/10/20 1209 11/11/20 0657  WBC 9.5 9.9  HGB 10.1* 10.3*  HCT 33.2* 32.8*  MCV 97.1 97.0  PLT 256 199   Recent Labs  Lab 11/10/20 1209 11/11/20 0657  NA 141 143  K 4.4 3.5  CL 105 97*  CO2 29 34*  GLUCOSE 244* 148*  BUN 30* 17  CREATININE 0.79 0.64  CALCIUM 9.3 8.9    F/u labs ordered Unresulted Labs (From admission, onward)     Start     Ordered   11/12/20 0500  CBC with Differential/Platelet  Daily,   STAT      11/11/20 1641   11/12/20 5170  Basic metabolic panel  Daily,   STAT      11/11/20 1641   11/12/20 0500  Magnesium  Tomorrow morning,   STAT        11/11/20 1641   11/12/20 0500  Phosphorus  Tomorrow morning,   STAT        11/11/20 1641            Signed, Terrilee Croak, MD [facility] 11/11/2020

## 2020-11-11 NOTE — ED Notes (Signed)
Pt provided a full linen, brief and purewick change by the dayshift RN & Tech.

## 2020-11-11 NOTE — ED Notes (Signed)
Called about room status showing ready to new accepting RN for 250. Accepting RN informed me that room is dirty. Charge RN Remo Lipps notified.

## 2020-11-11 NOTE — Progress Notes (Signed)
  CSW contacted Heart Failure RN, w/ heart failure consult request. 

## 2020-11-11 NOTE — ED Notes (Signed)
This RN noticed pt SPO2 int he 53s. Pt placed on nonrebreather, per Archie Balboa, MD.

## 2020-11-12 ENCOUNTER — Ambulatory Visit: Payer: Medicare Other | Admitting: Endocrinology

## 2020-11-12 ENCOUNTER — Encounter: Payer: Self-pay | Admitting: Internal Medicine

## 2020-11-12 LAB — BASIC METABOLIC PANEL
Anion gap: 11 (ref 5–15)
BUN: 20 mg/dL (ref 8–23)
CO2: 36 mmol/L — ABNORMAL HIGH (ref 22–32)
Calcium: 8.7 mg/dL — ABNORMAL LOW (ref 8.9–10.3)
Chloride: 93 mmol/L — ABNORMAL LOW (ref 98–111)
Creatinine, Ser: 0.7 mg/dL (ref 0.44–1.00)
GFR, Estimated: >60 mL/min (ref >60)
Glucose, Bld: 154 mg/dL — ABNORMAL HIGH (ref 70–99)
Potassium: 3.2 mmol/L — ABNORMAL LOW (ref 3.5–5.1)
Sodium: 140 mmol/L (ref 135–145)

## 2020-11-12 LAB — GLUCOSE, CAPILLARY
Glucose-Capillary: 154 mg/dL — ABNORMAL HIGH (ref 70–99)
Glucose-Capillary: 264 mg/dL — ABNORMAL HIGH (ref 70–99)
Glucose-Capillary: 62 mg/dL — ABNORMAL LOW (ref 70–99)
Glucose-Capillary: 101 mg/dL — ABNORMAL HIGH (ref 70–99)
Glucose-Capillary: 168 mg/dL — ABNORMAL HIGH (ref 70–99)

## 2020-11-12 LAB — CBC WITH DIFFERENTIAL/PLATELET
Abs Immature Granulocytes: 0.05 10*3/uL (ref 0.00–0.07)
Basophils Absolute: 0.1 10*3/uL (ref 0.0–0.1)
Basophils Relative: 1 %
Eosinophils Absolute: 0.3 10*3/uL (ref 0.0–0.5)
Eosinophils Relative: 3 %
HCT: 32.3 % — ABNORMAL LOW (ref 36.0–46.0)
Hemoglobin: 10.1 g/dL — ABNORMAL LOW (ref 12.0–15.0)
Immature Granulocytes: 1 %
Lymphocytes Relative: 8 %
Lymphs Abs: 0.8 10*3/uL (ref 0.7–4.0)
MCH: 29.4 pg (ref 26.0–34.0)
MCHC: 31.3 g/dL (ref 30.0–36.0)
MCV: 94.2 fL (ref 80.0–100.0)
Monocytes Absolute: 1 10*3/uL (ref 0.1–1.0)
Monocytes Relative: 10 %
Neutro Abs: 7.6 10*3/uL (ref 1.7–7.7)
Neutrophils Relative %: 77 %
Platelets: 236 10*3/uL (ref 150–400)
RBC: 3.43 MIL/uL — ABNORMAL LOW (ref 3.87–5.11)
RDW: 18 % — ABNORMAL HIGH (ref 11.5–15.5)
WBC: 9.8 10*3/uL (ref 4.0–10.5)
nRBC: 0 % (ref 0.0–0.2)

## 2020-11-12 LAB — PHOSPHORUS: Phosphorus: 3.2 mg/dL (ref 2.5–4.6)

## 2020-11-12 LAB — MAGNESIUM: Magnesium: 1.5 mg/dL — ABNORMAL LOW (ref 1.7–2.4)

## 2020-11-12 MED ORDER — SODIUM CHLORIDE 0.9 % IV BOLUS
250.0000 mL | Freq: Once | INTRAVENOUS | Status: AC
  Administered 2020-11-12: 250 mL via INTRAVENOUS

## 2020-11-12 MED ORDER — MAGNESIUM SULFATE 4 GM/100ML IV SOLN
4.0000 g | Freq: Once | INTRAVENOUS | Status: AC
  Administered 2020-11-12: 4 g via INTRAVENOUS
  Filled 2020-11-12: qty 100

## 2020-11-12 MED ORDER — POTASSIUM CHLORIDE CRYS ER 20 MEQ PO TBCR
40.0000 meq | EXTENDED_RELEASE_TABLET | Freq: Once | ORAL | Status: AC
  Administered 2020-11-12: 40 meq via ORAL
  Filled 2020-11-12: qty 2

## 2020-11-12 NOTE — Progress Notes (Signed)
PROGRESS NOTE  [name]  DOB: 1927-10-21  PCP: Ria Bush, MD GHW:[id number]  DOA: 11/10/2020  LOS: 2 days  Hospital Day: 3   Chief Complaint  Patient presents with   Shortness of Breath    Brief narrative: [name] is a 87 y.o. female with PMH significant for DM2, HTN, CAD/CABG, systolic CHF (EF 40 to 78% 7/22), A. fib on Eliquis, chronic respiratory failure on home O2 at 2 L, CVA, PUD with history of perforated ulcer, who was hospitalized from 6/30-7/5 with respiratory failure secondary to CHF exacerbation.   Patient was brought to the ED on 8/9 from [place] with complaint of shortness of breath with worsening oxygen requirement.,  Progressively worsening for a week that did not improve with breathing treatments.   In the ED, patient required 6 L of supplemental oxygen. Chest x-ray showed cardiomegaly and interstitial edema CT angio chest negative for pulm embolism also showed ill-defined opacities in the left lung base Patient was started on IV Lasix, broad-spectrum IV antibiotics Admitted to hospitalist service.  Subjective: Patient was seen and examined this morning.  Pleasant elderly Caucasian female.  Hard of hearing.   Much awake today.  Alert, awake, oriented x3.  Daughter-in-law at bedside.    Assessment/Plan: Acute exacerbation of chronic systolic CHF -Presented with shortness of breath elevated BNP, chest x-ray with interstitial edema -Recent echo from 7/22 with EF 40 to 45%. -She was initially diuresed with IV Lasix.  However in last 24 hours, her blood pressure has been running low.  Last night she required normal saline bolus of 250 ml.  This morning had to repeat it again for low blood pressure in 70s.  At the time of my evaluation, patient was alert, awake but is still running blood pressure low in 70s.  Her heart rate was already controlled to 70s. -For this morning we gave her metoprolol but held Cardizem and IV  Lasix. -Continue to monitor hemodynamics. -Net IO Since Admission: 490 mL [11/12/20 1256] -Continue to monitor for daily intake output, weight, blood pressure, BNP, renal function and electrolytes. Recent Labs  Lab 11/10/20 1209 11/11/20 0657 11/12/20 0648  BNP 693.8*  --   --   BUN 30* 17 20  CREATININE 0.79 0.64 0.70  K 4.4 3.5 3.2*  MG  --   --  1.5*    A. fib with RVR  -History of paroxysmal A. Fib.  -Currently controlled on metoprolol.  Cardizem had to be held this morning because of low blood pressure.  -Continue Eliquis for anticoagulation.  Acute on chronic respiratory failure with hypoxia -Primarily due to exacerbation of congestive heart failure. -Initially pneumonia was suspected but no fever, normal WBC count, normal procalcitonin.  Pneumonia ruled out.  No need of antibiotic treatment -On 2 L oxygen at baseline.  Currently requiring 6 L by nasal cannula.  Wean down as tolerated -Management of CHF as above.  Wean down oxygen supplementation as tolerated.  Acute metabolic encephalopathy -Family reports clear mental status prior to her fall and fracture in June.  Since then, she has had periods of confusion.  She had confusion prior to this admission as well probably due to hypoxia and progressive physical decline.  Currently oriented to place only.  Not restless or agitated.  Continue to monitor.  Coronary artery disease s/p CABG -Continue metoprolol, pravastatin  Type 2 diabetes mellitus Hyperglycemia -A1c 7.3 on 08/28/2020.  Blood sugar level was elevated to 244 on admission. -Home meds include  Januvia 50 mg daily, Actos 15 mg daily -Currently on Januvia.  I would avoid Actos because of coexisting CHF -Blood sugar level elevated to 264 this morning. -Continue sliding scale insulin with Accu-Cheks for now. Recent Labs  Lab 11/11/20 1646 11/11/20 2154 11/12/20 0752 11/12/20 1207  GLUCAP 179* 182* 154* 264*    GAD (generalized anxiety disorder) -Continue  Zoloft and alprazolam    Mobility: Encourage ambulation Code Status:   Code Status: DNR.  Discussed with patient's granddaughter at bedside. Nutritional status: Body mass index is 27.48 kg/m.     Diet:  Diet Order             Diet heart healthy/carb modified Room service appropriate? Yes; Fluid consistency: Thin  Diet effective now                  DVT prophylaxis:  apixaban (ELIQUIS) tablet 2.5 mg Start: 11/11/20 2200 apixaban (ELIQUIS) tablet 2.5 mg   Antimicrobials: None Fluid: None Consultants: None Family Communication: Discussed with patient's daughter-in-law at bedside  Status is: Inpatient  Remains inpatient appropriate because: Needs monitoring of hemodynamics  Dispo: The patient is from: [facility]              Anticipated d/c is to: Back to the facility in 2 to 3 days              Patient currently is not medically stable to d/c.   Difficult to place patient No     Infusions:    Scheduled Meds:  apixaban  2.5 mg Oral BID   diclofenac Sodium  4 g Topical TID   insulin aspart  0-5 Units Subcutaneous QHS   insulin aspart  0-9 Units Subcutaneous TID WC   linagliptin  5 mg Oral Daily   melatonin  2.5 mg Oral QHS   metoprolol tartrate  50 mg Oral BID   pantoprazole  40 mg Oral BID   pravastatin  20 mg Oral Daily   sertraline  100 mg Oral Daily    Antimicrobials: Anti-infectives (From admission, onward)    Start     Dose/Rate Route Frequency Ordered Stop   11/10/20 1700  ceFEPIme (MAXIPIME) 2 g in sodium chloride 0.9 % 100 mL IVPB        2 g 200 mL/hr over 30 Minutes Intravenous  Once 11/10/20 1652 11/10/20 1748   11/10/20 1700  vancomycin (VANCOCIN) IVPB 1000 mg/200 mL premix        1,000 mg 200 mL/hr over 60 Minutes Intravenous  Once 11/10/20 1652 11/10/20 1943       PRN meds: acetaminophen **OR** acetaminophen, ALPRAZolam, HYDROcodone-acetaminophen, nitroGLYCERIN, ondansetron **OR** ondansetron (ZOFRAN) IV, polyethylene glycol    Objective: Vitals:   11/12/20 1058 11/12/20 1216  BP: (!) 70/39 (!) 76/43  Pulse: 74 60  Resp: 20 (!) 22  Temp: 98.9 F (37.2 C) 97.7 F (36.5 C)  SpO2: 98% 100%    Intake/Output Summary (Last 24 hours) at 11/12/2020 1256 Last data filed at 11/12/2020 0950 Gross per 24 hour  Intake 490 ml  Output --  Net 490 ml   Filed Weights   11/10/20 1156  Weight: 57.6 kg   Weight change:  Body mass index is 27.48 kg/m.   Physical Exam: General exam: Pleasant, elderly Caucasian female.  Not in distress Skin: No rashes, lesions or ulcers. HEENT: Atraumatic, normocephalic, no obvious bleeding Lungs: Clear to auscultation bilaterally CVS: Rate controlled A. fib GI/Abd soft, nontender, nondistended, bowel sound present CNS: Alert,  awake, hard of hearing, oriented to place, person and time.   Psychiatry: Mood appropriate Extremities: No pedal edema, no calf redness  Data Review: I have personally reviewed the laboratory data and studies available.  Recent Labs  Lab 11/10/20 1209 11/11/20 0657 11/12/20 0648  WBC 9.5 9.9 9.8  NEUTROABS  --   --  7.6  HGB 10.1* 10.3* 10.1*  HCT 33.2* 32.8* 32.3*  MCV 97.1 97.0 94.2  PLT 256 199 236    Recent Labs  Lab 11/10/20 1209 11/11/20 0657 11/12/20 0648  NA 141 143 140  K 4.4 3.5 3.2*  CL 105 97* 93*  CO2 29 34* 36*  GLUCOSE 244* 148* 154*  BUN 30* 17 20  CREATININE 0.79 0.64 0.70  CALCIUM 9.3 8.9 8.7*  MG  --   --  1.5*  PHOS  --   --  3.2     F/u labs ordered Unresulted Labs (From admission, onward)     Start     Ordered   11/12/20 0500  CBC with Differential/Platelet  Daily,   STAT      11/11/20 1641   11/12/20 8295  Basic metabolic panel  Daily,   STAT      11/11/20 1641            Signed, Terrilee Croak, MD [facility] 11/12/2020

## 2020-11-12 NOTE — Progress Notes (Signed)
MEWS red this am. HR 133 and BP 88/57. Noitified dr Pietro Cassis, charge nurse stephanie and icu charge nurse [place]. 250 ml NS bolus given. Ordered to hold cardizem. Will continue to closely monitor.

## 2020-11-12 NOTE — TOC Initial Note (Signed)
Transition of Care [facility]) - Initial/Assessment Note    Patient Details  Name: [name] MRN: [id number] Date of Birth: [birth year]  [facility] CM/SW Contact:    Alberteen Sam, LCSW Phone Number: 11/12/2020, 2:51 PM  Clinical Narrative:                  CSW spoke with patient's granddaughter Susie who reports patient was recently at [facility] for short term rehab, was discharged and had a bed at [facility] prior to admission to hospital.   Susie reports plan will be for patient to dc to [facility]F if possible , pending PT input.    Expected Discharge Plan:  (TBD) Barriers to Discharge: Continued Medical Work up   Patient Goals and CMS Choice   CMS Medicare.gov Compare Post Acute Care list provided to:: Patient Represenative (must comment) (granddaughter Susie) Choice offered to / list presented to :  (granddaughter Susie)  Expected Discharge Plan and Services Expected Discharge Plan:  (TBD)     Post Acute Care Choice:  (TBD) Living arrangements for the past 2 months:  (STR Radiation protection practitioner)                                      Prior Living Arrangements/Services Living arrangements for the past 2 months:  (STR Radiation protection practitioner) Lives with:: Facility Resident Patient language and need for interpreter reviewed:: Yes        Need for Family Participation in Patient Care: Yes (Comment) Care giver support system in place?: Yes (comment)   Criminal Activity/Legal Involvement Pertinent to Current Situation/Hospitalization: No - Comment as needed  Activities of Daily Living Home Assistive Devices/Equipment: Wheelchair, Oxygen ADL Screening (condition at time of admission) Patient's cognitive ability adequate to safely complete daily activities?: Yes Is the patient deaf or have difficulty hearing?: Yes Does the patient have difficulty seeing, even when wearing glasses/contacts?: No Does the patient have difficulty concentrating, remembering,  or making decisions?: Yes Patient able to express need for assistance with ADLs?: Yes Does the patient have difficulty dressing or bathing?: Yes Independently performs ADLs?: Yes (appropriate for developmental age) Does the patient have difficulty walking or climbing stairs?: Yes Weakness of Legs: Both Weakness of Arms/Hands: None  Permission Sought/Granted      Share Information with NAME: Susie     Permission granted to share info w Relationship: granddaughter  Permission granted to share info w Contact Information: [phone]  Emotional Assessment         Alcohol / Substance Use: Not Applicable Psych Involvement: No (comment)  Admission diagnosis:  Acute respiratory failure ([place]) [J96.00] Shortness of breath [R06.02] Hypoxia [R09.02] Patient Active Problem List   Diagnosis Date Noted   Acute on chronic systolic CHF (congestive heart failure) ([place]) 11/10/2020   Chronic anticoagulation 11/10/2020   Hypomagnesemia 10/03/2020   Depression with anxiety 10/01/2020   DVT (deep venous thrombosis) ([place]) 10/01/2020   Acute on chronic diastolic CHF (congestive heart failure) ([place]) 10/01/2020   Atrial fibrillation, chronic ([place]) 09/22/2020   Cerebral atrophy ([place]) 09/22/2020   Atherosclerosis of aorta ([place]) 09/22/2020   Osteoporosis 09/22/2020   Bilirubinemia 09/22/2020   Leukocytosis 09/22/2020   Pyuria 09/22/2020   History of recurrent UTIs 09/22/2020   Hearing loss 09/22/2020   Chronic respiratory failure with hypoxia, on home O2 therapy ([place]) 09/22/2020   SIRS (systemic inflammatory response syndrome) ([place])  Altered mental status, unspecified 09/21/2020   Postoperative anemia due to acute blood loss 09/13/2020   Displaced intertrochanteric fracture of right femur, initial encounter for closed fracture ([place]) 09/05/2020   Hip fracture due to osteoporosis with routine healing 09/05/2020   Financial difficulty 02/26/2020   UTI (urinary tract infection) 10/01/2019   CKD  stage 3 due to type 2 diabetes mellitus ([place]) 09/02/2019   History of CVA in adulthood 08/23/2019   Polypharmacy 07/27/2019   E coli bacteremia 02/13/2019   Dyspnea    Acute on chronic respiratory failure with hypoxia and hypercapnia (HCC)    Chronic leg pain 11/14/2018   Atherosclerosis of native arteries of the extremities with ulceration ([place]) 08/21/2018   Chronic constipation 06/18/2018   Advanced directives, counseling/discussion 01/29/2018   Bilateral hearing loss 01/29/2018   Spinal stenosis of lumbosacral region 57/32/2025   Systolic murmur 42/70/6237   De Quervain's tenosynovitis, left 01/09/2017   Encounter for chronic pain management 02/09/2016   Paroxysmal atrial fibrillation (HCC)    Acute on chronic respiratory failure with hypoxia ([place]) 09/27/2015   Varicose veins of left lower extremity with inflammation, with ulcer of calf limited to breakdown of skin ([place]) 05/22/2015   Varicose veins of right lower extremity with inflammation 01/23/2015   Diabetes mellitus type 2 with peripheral artery disease (HCC)    Chronic venous insufficiency    Bleeding from varicose veins of lower extremity, left 02/13/2014   Diabetic polyneuropathy ([place]) 11/19/2013   Right shoulder pain 08/19/2013   Abdominal pain 05/14/2012   Unsteady gait 12/13/2011   Chronic lower back pain 09/14/2011   Hyperlipidemia associated with type 2 diabetes mellitus ([place]) 09/06/2010   GAD (generalized anxiety disorder) 03/15/2010   Obesity, Class I, BMI 30-34.9 03/15/2010   Hypertension associated with type 2 diabetes mellitus ([place]) 03/15/2010   (HFpEF) heart failure with preserved ejection fraction ([place]) 03/15/2010   Coronary artery disease    PCP:  Ria Bush, MD Pharmacy:  No Pharmacies Listed    Social Determinants of Health (SDOH) Interventions    Readmission Risk Interventions Readmission Risk Prevention Plan 10/02/2020 09/04/2019  Transportation Screening Complete Complete  PCP or Specialist  Appt within 3-5 Days Complete Complete  HRI or [place] Complete Complete  Social Work Consult for [place] Planning/Counseling Complete -  Palliative Care Screening Not Applicable Not Applicable  Medication Review Press photographer) Complete Complete  Some recent data might be hidden

## 2020-11-12 NOTE — Progress Notes (Signed)
Spoke with Dr. Sidney Ace regarding ongoing low blood pressures and current high HR. MD placed order for 250 ml bolus and administered by this RN per [facility].  Pt remains asymptomatic with these vitals.  Granddaughter at bedside.  Updated patient and granddaughter regarding plan of care at this time.  Verbalized understanding.   11/12/20 1923  Provider Notification  Provider Name/Title dr Sidney Ace  Date Provider Notified 11/12/20  Time Provider Notified 1930  Notification Type Page  Notification Reason Other (Comment) (low BP high HR)  Provider response See new orders  Date of Provider Response 11/12/20  Time of Provider Response 1945

## 2020-11-12 NOTE — Progress Notes (Signed)
Patients bp remains low at 81/43 with MAP of 56. Dr dahal notified and because patient remains asymptomatic he does not want to treat at this time, just continue to monitor. I have placed patient on a bedside monitor and am monitoring vitals every 30 minutes. Will notify provider if there are any changes. Will continue to closely monitor patient.

## 2020-11-12 NOTE — Consult Note (Addendum)
[facility] Nurse Consult Note: Reason for Consult: Consult requested for left leg.  Pt had abrasions occur prior to admission. Related to a fall. Wound type: 2 areas of full thickness skin tears to left anterior calf; .2X.2X.2cm, 100% red and moist, and 5X.3X.2cm, 50% red and moist, 50% skin approximated over wound edges. One area of dry scabbed healed wound to lower calf. Scattered bruises surrounding the locations. Dressing procedure/placement/frequency: Topical treatment orders provided for bedside nurses to perform as follows to promote healing: Foam dressing to left leg wounds, change Q 3 days or PRN soiling. Please re-consult if further assistance is needed.  Thank-you,  Julien Girt MSN, [place], [place], [place], [place]

## 2020-11-13 LAB — CBC WITH DIFFERENTIAL/PLATELET
Abs Immature Granulocytes: 0.05 K/uL (ref 0.00–0.07)
Basophils Absolute: 0.1 K/uL (ref 0.0–0.1)
Basophils Relative: 1 %
Eosinophils Absolute: 0.3 K/uL (ref 0.0–0.5)
Eosinophils Relative: 3 %
HCT: 32.3 % — ABNORMAL LOW (ref 36.0–46.0)
Hemoglobin: 10 g/dL — ABNORMAL LOW (ref 12.0–15.0)
Immature Granulocytes: 1 %
Lymphocytes Relative: 11 %
Lymphs Abs: 1 K/uL (ref 0.7–4.0)
MCH: 29.3 pg (ref 26.0–34.0)
MCHC: 31 g/dL (ref 30.0–36.0)
MCV: 94.7 fL (ref 80.0–100.0)
Monocytes Absolute: 1.1 K/uL — ABNORMAL HIGH (ref 0.1–1.0)
Monocytes Relative: 11 %
Neutro Abs: 6.9 K/uL (ref 1.7–7.7)
Neutrophils Relative %: 73 %
Platelets: 240 K/uL (ref 150–400)
RBC: 3.41 MIL/uL — ABNORMAL LOW (ref 3.87–5.11)
RDW: 17.7 % — ABNORMAL HIGH (ref 11.5–15.5)
WBC: 9.5 K/uL (ref 4.0–10.5)
nRBC: 0 % (ref 0.0–0.2)

## 2020-11-13 LAB — BASIC METABOLIC PANEL WITH GFR
Anion gap: 9 (ref 5–15)
BUN: 29 mg/dL — ABNORMAL HIGH (ref 8–23)
CO2: 34 mmol/L — ABNORMAL HIGH (ref 22–32)
Calcium: 8.9 mg/dL (ref 8.9–10.3)
Chloride: 96 mmol/L — ABNORMAL LOW (ref 98–111)
Creatinine, Ser: 0.83 mg/dL (ref 0.44–1.00)
GFR, Estimated: >60 mL/min
Glucose, Bld: 145 mg/dL — ABNORMAL HIGH (ref 70–99)
Potassium: 3.7 mmol/L (ref 3.5–5.1)
Sodium: 139 mmol/L (ref 135–145)

## 2020-11-13 LAB — GLUCOSE, CAPILLARY
Glucose-Capillary: 153 mg/dL — ABNORMAL HIGH (ref 70–99)
Glucose-Capillary: 198 mg/dL — ABNORMAL HIGH (ref 70–99)
Glucose-Capillary: 140 mg/dL — ABNORMAL HIGH (ref 70–99)
Glucose-Capillary: 157 mg/dL — ABNORMAL HIGH (ref 70–99)

## 2020-11-13 MED ORDER — MIDODRINE HCL 5 MG PO TABS
5.0000 mg | ORAL_TABLET | Freq: Three times a day (TID) | ORAL | Status: DC
  Administered 2020-11-13 – 2020-11-16 (×9): 5 mg via ORAL
  Filled 2020-11-13 (×9): qty 1

## 2020-11-13 MED ORDER — METOPROLOL TARTRATE 25 MG PO TABS
75.0000 mg | ORAL_TABLET | Freq: Two times a day (BID) | ORAL | Status: DC
  Administered 2020-11-13 – 2020-11-16 (×7): 75 mg via ORAL
  Filled 2020-11-13 (×7): qty 3

## 2020-11-13 NOTE — Evaluation (Signed)
Physical Therapy Evaluation Patient Details Name: [name] MRN: [id number] DOB: Dec 07, 1927 Today's Date: 11/13/2020   History of Present Illness  presented to ER from STR secondary to progressive SOB; admitted for management of acute/chronic respiratory failure related to CHF exacerbation.  Clinical Impression  Patient sleeping in room upon arrival to session; easily awakens to light touch/voice.  Alert and oriented to basic information; follows commands and agreeable to participation with session.  Extremely HOH; relies on reading lips/seeing face for optimal comprehension/communication.  Hearing aides located and plugged in in room for charging.  Patient generally weak and deconditioned throughout all extremities with focal weakness noted throughout R LE (recent hip fracture/repair 09/07/20).  Currently requiring min assist for bed mobility; min assist with RW for sit/stand, basic transfers and gait (5') with RW.  Demonstrates very short, shuffling steps; difficulty accepting weight in R LE at times, preferring to scoot/slide L LE versus step during limb advancement.  Unsteady, requiring RW and +1 at all times.  Mod SOB with minimal exertion; sats >92% on 4L, HR 80-90 throughout session Would benefit from skilled PT to address above deficits and promote optimal return to PLOF.; recommend transition to STR upon discharge from acute hospitalization.     Follow Up Recommendations SNF    Equipment Recommendations       Recommendations for Other Services       Precautions / Restrictions Precautions Precautions: Fall Restrictions Weight Bearing Restrictions: Yes RLE Weight Bearing: Weight bearing as tolerated (R hip fracture/IM nailing, WBAT (09/07/20))      Mobility  Bed Mobility Overal bed mobility: Needs Assistance Bed Mobility: Supine to Sit     Supine to sit: Min guard;Min assist     General bed mobility comments: assist for scooting edge of bed    Transfers Overall  transfer level: Needs assistance Equipment used: Rolling walker (2 wheeled) Transfers: Sit to/from Stand Sit to Stand: Min assist         General transfer comment: assist for lift off, anterior weight translation, initial stabilization; decreased active use/weight shift to R LE  Ambulation/Gait Ambulation/Gait assistance: Min assist Gait Distance (Feet): 5 Feet Assistive device: Rolling walker (2 wheeled)       General Gait Details: very short, shuffling steps; difficulty accepting weight in R LE at times, preferring to scoot/slide L LE versus step during limb advancement.  Unsteady, requiring RW and +1 at all times.  Mod SOB with minimal exertion; sats >92% on 4L, HR 80-90 throughout session  Stairs            Wheelchair Mobility    Modified Rankin (Stroke Patients Only)       Balance Overall balance assessment: Needs assistance Sitting-balance support: No upper extremity supported;Feet supported Sitting balance-Leahy Scale: Good     Standing balance support: Bilateral upper extremity supported Standing balance-Leahy Scale: Fair                               Pertinent Vitals/Pain Pain Assessment: No/denies pain    Home Living Family/patient expects to be discharged to:: Skilled nursing facility       Home Access: Stairs to enter         Additional Comments: At baseline, lives with boyfriend of 59 years in single-level home, 7 steps to enter (bilat rails).  Has been at [facility] for [place] since hip fracture/repair; planning for transition to ALF once rehab completed (per chart)  Prior Function Level of Independence: Needs assistance         Comments: At baseline, ambulatory with 4WRW for limited household distances; manual WC for use as needed; does endorse multiple fall history.  Has been at [facility] since hip fracture/repair; planning for transition to ALF upon completion of rehab (per chart)     Hand Dominance         Extremity/Trunk Assessment   Upper Extremity Assessment Upper Extremity Assessment: Overall WFL for tasks assessed    Lower Extremity Assessment Lower Extremity Assessment: Generalized weakness (R LE grossly 3-/5, L LE grossly 4-/5)       Communication   Communication: HOH (extremely HOH, relies on lip reading at times; hearing aides located and plugged in for charging in room)  Cognition Arousal/Alertness: Awake/alert Behavior During Therapy: [facility] for tasks assessed/performed Overall Cognitive Status: Within Functional Limits for tasks assessed                                        General Comments      Exercises Other Exercises Other Exercises: Unsupported sitting edge of bed, worked on postural extension and pursed lip breathing; oral care and light grooming activities (set up) Other Exercises: Educated in safety needs, transfer mechanics, mobiltiy progression and goals of therapy; patient voiced understanding.   Assessment/Plan    PT Assessment Patient needs continued PT services  PT Problem List Decreased strength;Decreased activity tolerance;Decreased balance;Decreased mobility;Decreased cognition;Decreased knowledge of use of DME;Decreased safety awareness;Cardiopulmonary status limiting activity;Decreased range of motion;Decreased knowledge of precautions       PT Treatment Interventions DME instruction;Gait training;Functional mobility training;Therapeutic activities;Therapeutic exercise;Balance training;Patient/family education    PT Goals (Current goals can be found in the Care Plan section)  Acute Rehab PT Goals Patient Stated Goal: to get stronger and to see [name] PT Goal Formulation: With patient Time For Goal Achievement: 11/27/20 Potential to Achieve Goals: Good    Frequency Min 2X/week   Barriers to discharge Decreased caregiver support      Co-evaluation               AM-PAC PT "6 Clicks" Mobility  Outcome Measure Help  needed turning from your back to your side while in a flat bed without using bedrails?: None Help needed moving from lying on your back to sitting on the side of a flat bed without using bedrails?: A Schneller Help needed moving to and from a bed to a chair (including a wheelchair)?: A Schweizer Help needed standing up from a chair using your arms (e.g., wheelchair or bedside chair)?: A Hudson Help needed to walk in hospital room?: A Everman Help needed climbing 3-5 steps with a railing? : A Lot 6 Click Score: 18    End of Session Equipment Utilized During Treatment: Oxygen Activity Tolerance: Patient tolerated treatment well Patient left: in chair;with call bell/phone within reach;with chair alarm set Nurse Communication: Mobility status      Time: 3354-5625 PT Time Calculation (min) (ACUTE ONLY): 35 min   Charges:   PT Evaluation $PT Eval Moderate Complexity: 1 Mod PT Treatments $Therapeutic Activity: 23-37 mins       Rulon Abdalla H. Owens Shark, PT, DPT, NCS 11/13/20, 11:37 AM [phone]

## 2020-11-13 NOTE — Progress Notes (Signed)
PROGRESS NOTE  [name]  DOB: Jun 18, 1927  PCP: Ria Bush, MD JJH:[id number]  DOA: 11/10/2020  LOS: 3 days  Hospital Day: 4   Chief Complaint  Patient presents with   Shortness of Breath    Brief narrative: [name] is a 87 y.o. female with PMH significant for DM2, HTN, CAD/CABG, systolic CHF (EF 40 to 81% 7/22), A. fib on Eliquis, chronic respiratory failure on home O2 at 2 L, CVA, PUD with history of perforated ulcer, who was hospitalized from 6/30-7/5 with respiratory failure secondary to CHF exacerbation.   Patient was brought to the ED on 8/9 from [place] with complaint of shortness of breath with worsening oxygen requirement.,  Progressively worsening for a week that did not improve with breathing treatments.   In the ED, patient required 6 L of supplemental oxygen. Chest x-ray showed cardiomegaly and interstitial edema CT angio chest negative for pulm embolism also showed ill-defined opacities in the left lung base Patient was started on IV Lasix, broad-spectrum IV antibiotics Admitted to hospitalist service.  Subjective: Patient was seen and examined this morning.   Feels better.  Looks better.  Sitting up in chair.  Not in distress.  In last 24 hours, her heart rate and blood pressure have improved significantly.  Was able to participate with physical therapy today.  Assessment/Plan: Acute exacerbation of chronic systolic CHF -Presented with shortness of breath elevated BNP, chest x-ray with interstitial edema -Recent echo from 7/22 with EF 40 to 45%. -Currently hemodynamically stable on metoprolol 75 mg twice daily.  Diuresis on hold because of unstable blood pressure. -Continue to monitor hemodynamics. -Net IO Since Admission: 1,320 mL [11/13/20 1614] -Continue to monitor for daily intake output, weight, blood pressure, BNP, renal function and electrolytes. Recent Labs  Lab 11/10/20 1209 11/11/20 0657 11/12/20 0648 11/13/20 0639   BNP 693.8*  --   --   --   BUN 30* 17 20 29*  CREATININE 0.79 0.64 0.70 0.83  K 4.4 3.5 3.2* 3.7  MG  --   --  1.5*  --     A. fib with RVR  -History of paroxysmal A. Fib.  -Difficult to control heart rate.  Seems better today after metoprolol increased to 75 mg twice daily.  Cardizem has been on hold because of low blood pressures -Continue Eliquis for anticoagulation.  Acute on chronic respiratory failure with hypoxia -Primarily due to exacerbation of congestive heart failure. -Initially pneumonia was suspected but no fever, normal WBC count, normal procalcitonin.  Pneumonia ruled out.  No need of antibiotic treatment -On 2 L oxygen at baseline.  Currently requiring 6 L by nasal cannula.  Wean down as tolerated -Management of CHF as above.  Wean down oxygen supplementation as tolerated.  Acute metabolic encephalopathy -Mental status seems to be improving.  Hard of hearing.  Oriented x3 at this time.  Coronary artery disease s/p CABG -Continue metoprolol, pravastatin  Type 2 diabetes mellitus Hyperglycemia -A1c 7.3 on 08/28/2020.  Blood sugar level was elevated to 244 on admission. -Home meds include Januvia 50 mg daily, Actos 15 mg daily -Currently on Januvia and sliding scale insulin.  Actos on hold because of coexisting CHF.   -Blood sugar level consistently less than 200. Recent Labs  Lab 11/12/20 1728 11/12/20 2009 11/13/20 0827 11/13/20 1231 11/13/20 1611  GLUCAP 101* 168* 153* 198* 140*    GAD (generalized anxiety disorder) -Continue Zoloft and alprazolam  Impaired mobility -PT eval obtained.  SNF recommended.  Code Status:   Code Status: DNR.  Discussed with patient's granddaughter at bedside. Nutritional status: Body mass index is 28.91 kg/m.     Diet:  Diet Order             Diet heart healthy/carb modified Room service appropriate? Yes; Fluid consistency: Thin  Diet effective now                  DVT prophylaxis:  apixaban (ELIQUIS)  tablet 2.5 mg Start: 11/11/20 2200 apixaban (ELIQUIS) tablet 2.5 mg   Antimicrobials: None Fluid: None Consultants: None Family Communication: Family not at bedside today.  Status is: Inpatient  Remains inpatient appropriate because: Needs monitoring of hemodynamics  Dispo: The patient is from: [facility]              Anticipated d/c is to: Back to the facility in 1 to 2 days.              Patient currently is not medically stable to d/c.   Difficult to place patient No     Infusions:    Scheduled Meds:  apixaban  2.5 mg Oral BID   diclofenac Sodium  4 g Topical TID   insulin aspart  0-5 Units Subcutaneous QHS   insulin aspart  0-9 Units Subcutaneous TID WC   linagliptin  5 mg Oral Daily   melatonin  2.5 mg Oral QHS   metoprolol tartrate  75 mg Oral BID   midodrine  5 mg Oral TID WC   pantoprazole  40 mg Oral BID   pravastatin  20 mg Oral Daily   sertraline  100 mg Oral Daily    Antimicrobials: Anti-infectives (From admission, onward)    Start     Dose/Rate Route Frequency Ordered Stop   11/10/20 1700  ceFEPIme (MAXIPIME) 2 g in sodium chloride 0.9 % 100 mL IVPB        2 g 200 mL/hr over 30 Minutes Intravenous  Once 11/10/20 1652 11/10/20 1748   11/10/20 1700  vancomycin (VANCOCIN) IVPB 1000 mg/200 mL premix        1,000 mg 200 mL/hr over 60 Minutes Intravenous  Once 11/10/20 1652 11/10/20 1943       PRN meds: acetaminophen **OR** acetaminophen, ALPRAZolam, HYDROcodone-acetaminophen, nitroGLYCERIN, ondansetron **OR** ondansetron (ZOFRAN) IV, polyethylene glycol   Objective: Vitals:   11/13/20 1231 11/13/20 1449  BP: 103/61 116/66  Pulse: 62 92  Resp: 17 17  Temp: 98.2 F (36.8 C) 98.6 F (37 C)  SpO2: 100% 99%    Intake/Output Summary (Last 24 hours) at 11/13/2020 1614 Last data filed at 11/13/2020 0400 Gross per 24 hour  Intake 490 ml  Output --  Net 490 ml    Filed Weights   11/10/20 1156 11/12/20 1500 11/13/20 0300  Weight: 57.6 kg 59.3  kg 60.6 kg   Weight change:  Body mass index is 28.91 kg/m.   Physical Exam: General exam: Pleasant, elderly Caucasian female.  Sitting up in chair.  Not in physical distress Skin: No rashes, lesions or ulcers. HEENT: Atraumatic, normocephalic, no obvious bleeding Lungs: Clear to auscultation bilaterally CVS: Rate controlled A. fib. GI/Abd soft, nontender, nondistend, bowel sound present CNS: Alert, awake, hard of hearing, oriented x3 Psychiatry: Mood appropriate Extremities: No pedal edema, no calf redness  Data Review: I have personally reviewed the laboratory data and studies available.  Recent Labs  Lab 11/10/20 1209 11/11/20 0657 11/12/20 0648 11/13/20 0639  WBC 9.5 9.9 9.8 9.5  NEUTROABS  --   --  7.6 6.9  HGB 10.1* 10.3* 10.1* 10.0*  HCT 33.2* 32.8* 32.3* 32.3*  MCV 97.1 97.0 94.2 94.7  PLT 256 199 236 240    Recent Labs  Lab 11/10/20 1209 11/11/20 0657 11/12/20 0648 11/13/20 0639  NA 141 143 140 139  K 4.4 3.5 3.2* 3.7  CL 105 97* 93* 96*  CO2 29 34* 36* 34*  GLUCOSE 244* 148* 154* 145*  BUN 30* 17 20 29*  CREATININE 0.79 0.64 0.70 0.83  CALCIUM 9.3 8.9 8.7* 8.9  MG  --   --  1.5*  --   PHOS  --   --  3.2  --      F/u labs ordered Unresulted Labs (From admission, onward)     Start     Ordered   11/12/20 0500  CBC with Differential/Platelet  Daily,   STAT      11/11/20 1641   11/12/20 7741  Basic metabolic panel  Daily,   STAT      11/11/20 1641            Signed, Terrilee Croak, MD [facility] 11/13/2020

## 2020-11-13 NOTE — NC FL2 (Signed)
[place] LEVEL OF CARE SCREENING TOOL     IDENTIFICATION  Patient Name: [name] Birthdate: [birth year] Sex: female Admission Date (Current Location): 11/10/2020  [facility] and [place] Number:  Engineering geologist and Address:  [facility], [address], [place], [place] [address]      Provider Number: [id number]  Attending Physician Name and Address:  Terrilee Croak, MD  Relative Name and Phone Number:  [name] (granddaughter) [phone]    Current Level of Care: Hospital Recommended Level of Care: [place] Prior Approval Number:    Date Approved/Denied:   PASRR Number: [id number]  Discharge Plan: SNF    Current Diagnoses: Patient Active Problem List   Diagnosis Date Noted   Acute on chronic systolic CHF (congestive heart failure) ([place]) 11/10/2020   Chronic anticoagulation 11/10/2020   Hypomagnesemia 10/03/2020   Depression with anxiety 10/01/2020   DVT (deep venous thrombosis) ([place]) 10/01/2020   Acute on chronic diastolic CHF (congestive heart failure) ([place]) 10/01/2020   Atrial fibrillation, chronic ([place]) 09/22/2020   Cerebral atrophy ([place]) 09/22/2020   Atherosclerosis of aorta ([place]) 09/22/2020   Osteoporosis 09/22/2020   Bilirubinemia 09/22/2020   Leukocytosis 09/22/2020   Pyuria 09/22/2020   History of recurrent UTIs 09/22/2020   Hearing loss 09/22/2020   Chronic respiratory failure with hypoxia, on home O2 therapy ([place]) 09/22/2020   SIRS (systemic inflammatory response syndrome) ([place])    Altered mental status, unspecified 09/21/2020   Postoperative anemia due to acute blood loss 09/13/2020   Displaced intertrochanteric fracture of right femur, initial encounter for closed fracture ([place]) 09/05/2020   Hip fracture due to osteoporosis with routine healing 09/05/2020   Financial difficulty 02/26/2020   UTI (urinary tract infection) 10/01/2019   CKD stage 3 due to type 2 diabetes mellitus ([place])  09/02/2019   History of CVA in adulthood 08/23/2019   Polypharmacy 07/27/2019   E coli bacteremia 02/13/2019   Dyspnea    Acute on chronic respiratory failure with hypoxia and hypercapnia (HCC)    Chronic leg pain 11/14/2018   Atherosclerosis of native arteries of the extremities with ulceration ([place]) 08/21/2018   Chronic constipation 06/18/2018   Advanced directives, counseling/discussion 01/29/2018   Bilateral hearing loss 01/29/2018   Spinal stenosis of lumbosacral region 29/93/7169   Systolic murmur 67/89/3810   De Quervain's tenosynovitis, left 01/09/2017   Encounter for chronic pain management 02/09/2016   Paroxysmal atrial fibrillation (HCC)    Acute on chronic respiratory failure with hypoxia ([place]) 09/27/2015   Varicose veins of left lower extremity with inflammation, with ulcer of calf limited to breakdown of skin ([place]) 05/22/2015   Varicose veins of right lower extremity with inflammation 01/23/2015   Diabetes mellitus type 2 with peripheral artery disease (HCC)    Chronic venous insufficiency    Bleeding from varicose veins of lower extremity, left 02/13/2014   Diabetic polyneuropathy ([place]) 11/19/2013   Right shoulder pain 08/19/2013   Abdominal pain 05/14/2012   Unsteady gait 12/13/2011   Chronic lower back pain 09/14/2011   Hyperlipidemia associated with type 2 diabetes mellitus ([place]) 09/06/2010   GAD (generalized anxiety disorder) 03/15/2010   Obesity, Class I, BMI 30-34.9 03/15/2010   Hypertension associated with type 2 diabetes mellitus ([place]) 03/15/2010   (HFpEF) heart failure with preserved ejection fraction ([place]) 03/15/2010   Coronary artery disease     Orientation RESPIRATION BLADDER Height & Weight     Self, Situation, Place, Time  O2 (5L nasal cannula) Incontinent, External catheter Weight:  133 lb 9.6 oz (60.6 kg) Height:  4\' 9"  (144.8 cm)  BEHAVIORAL SYMPTOMS/MOOD NEUROLOGICAL BOWEL NUTRITION STATUS      Continent Diet (see discharge summary)   AMBULATORY STATUS COMMUNICATION OF NEEDS Skin   Limited Assist Verbally Other (Comment) (abrasion left lower leg)                       Personal Care Assistance Level of Assistance  Bathing, Feeding, Dressing, Total care Bathing Assistance: Limited assistance Feeding assistance: Independent Dressing Assistance: Limited assistance Total Care Assistance: Limited assistance   Functional Limitations Info  Sight, Hearing, Speech Sight Info: Adequate Hearing Info: Impaired Speech Info: Adequate    SPECIAL CARE FACTORS FREQUENCY  PT (By licensed PT), OT (By licensed OT)     PT Frequency: min 4x weekly OT Frequency: min 4x weekly            Contractures Contractures Info: Not present    Additional Factors Info  Code Status, Allergies Code Status Info: DNR Allergies Info: Metformin and related           Current Medications (11/13/2020):  This is the current hospital active medication list Current Facility-Administered Medications  Medication Dose Route Frequency Provider Last Rate Last Admin   acetaminophen (TYLENOL) tablet 650 mg  650 mg Oral Q6H PRN Athena Masse, MD   650 mg at 11/12/20 1116   Or   acetaminophen (TYLENOL) suppository 650 mg  650 mg Rectal Q6H PRN Athena Masse, MD       ALPRAZolam Duanne Moron) tablet 0.25 mg  0.25 mg Oral BID PRN Athena Masse, MD   0.25 mg at 11/13/20 0303   apixaban (ELIQUIS) tablet 2.5 mg  2.5 mg Oral BID Dahal, Marlowe Aschoff, MD   2.5 mg at 11/13/20 0846   diclofenac Sodium (VOLTAREN) 1 % topical gel 4 g  4 g Topical TID Athena Masse, MD   4 g at 11/13/20 0847   HYDROcodone-acetaminophen (NORCO/VICODIN) 5-325 MG per tablet 1-2 tablet  1-2 tablet Oral Q4H PRN Athena Masse, MD   2 tablet at 11/11/20 1100   insulin aspart (novoLOG) injection 0-5 Units  0-5 Units Subcutaneous QHS Dahal, Binaya, MD       insulin aspart (novoLOG) injection 0-9 Units  0-9 Units Subcutaneous TID WC Dahal, Marlowe Aschoff, MD   2 Units at 11/13/20 1300    linagliptin (TRADJENTA) tablet 5 mg  5 mg Oral Daily Dahal, Binaya, MD   5 mg at 11/13/20 0846   melatonin tablet 2.5 mg  2.5 mg Oral QHS Judd Gaudier V, MD   2.5 mg at 11/12/20 2111   metoprolol tartrate (LOPRESSOR) tablet 75 mg  75 mg Oral BID Terrilee Croak, MD   75 mg at 11/13/20 0846   midodrine (PROAMATINE) tablet 5 mg  5 mg Oral TID WC Dahal, Marlowe Aschoff, MD       nitroGLYCERIN (NITROSTAT) SL tablet 0.4 mg  0.4 mg Sublingual Q5 min PRN Athena Masse, MD       ondansetron [facility]) tablet 4 mg  4 mg Oral Q6H PRN Athena Masse, MD       Or   ondansetron [facility]) injection 4 mg  4 mg Intravenous Q6H PRN Athena Masse, MD       pantoprazole (PROTONIX) EC tablet 40 mg  40 mg Oral BID Judd Gaudier V, MD   40 mg at 11/13/20 0846   polyethylene glycol (MIRALAX / GLYCOLAX) packet 17 g  17  g Oral Daily PRN Athena Masse, MD       pravastatin (PRAVACHOL) tablet 20 mg  20 mg Oral Daily Judd Gaudier V, MD   20 mg at 11/13/20 0846   sertraline (ZOLOFT) tablet 100 mg  100 mg Oral Daily Athena Masse, MD   100 mg at 11/13/20 0938     Discharge Medications: Please see discharge summary for a list of discharge medications.  Relevant Imaging Results:  Relevant Lab Results:   Additional Information SSN: [id number]  Alberteen Sam, LCSW

## 2020-11-13 NOTE — Consult Note (Signed)
   Heart failure nurse navigator note.  Heart failure with mildly reduced ejection fraction of 40 to 45%.  Presented to the emergency room with complaints of shortness of breath.  Comorbidities:  Atrial fibrillation on chronic anticoagulation Chronic respiratory failure on O2 at 2 L Hypertension Diabetes Coronary artery disease status post coronary artery bypass grafting  Labs:  Sodium 139, potassium 3.7, chloride 96, CO2 34, BUN 29, creatinine 0.83, BNP on admission was 693  weight is 60.6 kg yesterday was 59.3 kg as 693.  Intake 1070 Output not documented  Initial meeting with patient today she is awake and alert sitting up in the chair at bedside.  She states that it feels good to be sitting up in the chair today, she knew that they had trouble with her heart rate and blood pressure yesterday.  She states at the facility that she does not use salt and has not for a long time but was unclear if they weigh her daily.  Gust fluid intake and she did not feel that she drank over 64 ounces in a 24-hour period.  Pricilla Riffle RN CHFN

## 2020-11-13 NOTE — Care Management Important Message (Signed)
Important Message  Patient Details  Name: [name] MRN: [id number] Date of Birth: 10-29-1927   Medicare Important Message Given:  Yes     Dannette Barbara 11/13/2020, 1:29 PM

## 2020-11-13 NOTE — TOC Progression Note (Signed)
Transition of Care [facility]) - Progression Note    Patient Details  Name: [name] MRN: [id number] Date of Birth: 06-04-1927  Transition of Care [facility]) CM/SW [place], [place] Phone Number: 11/13/2020, 2:28 PM  Clinical Narrative:     CSW spoke with patient's granddaughter Susie regarding discharge planning and PT recommendation of SNF. Susie is in agreement with patient going back to [facility], CSW also spoke with [facility] to inform of plan.   CSW has sent referral to [facility] pending bed offere.   Expected Discharge Plan:  (TBD) Barriers to Discharge: Continued Medical Work up  Expected Discharge Plan and Services Expected Discharge Plan:  (TBD)     Post Acute Care Choice:  (TBD) Living arrangements for the past 2 months:  (STR Radiation protection practitioner)                                       Social Determinants of Health (SDOH) Interventions    Readmission Risk Interventions Readmission Risk Prevention Plan 10/02/2020 09/04/2019  Transportation Screening Complete Complete  PCP or Specialist Appt within 3-5 Days Complete Complete  HRI or [place] Complete Complete  Social Work Consult for [place] Planning/Counseling Complete -  Palliative Care Screening Not Applicable Not Applicable  Medication Review Press photographer) Complete Complete  Some recent data might be hidden

## 2020-11-14 LAB — GLUCOSE, CAPILLARY
Glucose-Capillary: 202 mg/dL — ABNORMAL HIGH (ref 70–99)
Glucose-Capillary: 282 mg/dL — ABNORMAL HIGH (ref 70–99)
Glucose-Capillary: 140 mg/dL — ABNORMAL HIGH (ref 70–99)
Glucose-Capillary: 120 mg/dL — ABNORMAL HIGH (ref 70–99)

## 2020-11-14 LAB — CBC WITH DIFFERENTIAL/PLATELET
Abs Immature Granulocytes: 0.06 10*3/uL (ref 0.00–0.07)
Basophils Absolute: 0.1 10*3/uL (ref 0.0–0.1)
Basophils Relative: 1 %
Eosinophils Absolute: 0.3 10*3/uL (ref 0.0–0.5)
Eosinophils Relative: 3 %
HCT: 31.2 % — ABNORMAL LOW (ref 36.0–46.0)
Hemoglobin: 9.5 g/dL — ABNORMAL LOW (ref 12.0–15.0)
Immature Granulocytes: 1 %
Lymphocytes Relative: 12 %
Lymphs Abs: 1.1 10*3/uL (ref 0.7–4.0)
MCH: 28.7 pg (ref 26.0–34.0)
MCHC: 30.4 g/dL (ref 30.0–36.0)
MCV: 94.3 fL (ref 80.0–100.0)
Monocytes Absolute: 1.1 10*3/uL — ABNORMAL HIGH (ref 0.1–1.0)
Monocytes Relative: 12 %
Neutro Abs: 7.1 10*3/uL (ref 1.7–7.7)
Neutrophils Relative %: 71 %
Platelets: 253 10*3/uL (ref 150–400)
RBC: 3.31 MIL/uL — ABNORMAL LOW (ref 3.87–5.11)
RDW: 17.7 % — ABNORMAL HIGH (ref 11.5–15.5)
WBC: 9.8 10*3/uL (ref 4.0–10.5)
nRBC: 0 % (ref 0.0–0.2)

## 2020-11-14 LAB — BASIC METABOLIC PANEL
Anion gap: 11 (ref 5–15)
BUN: 30 mg/dL — ABNORMAL HIGH (ref 8–23)
CO2: 33 mmol/L — ABNORMAL HIGH (ref 22–32)
Calcium: 9 mg/dL (ref 8.9–10.3)
Chloride: 95 mmol/L — ABNORMAL LOW (ref 98–111)
Creatinine, Ser: 0.68 mg/dL (ref 0.44–1.00)
GFR, Estimated: >60 mL/min (ref >60)
Glucose, Bld: 128 mg/dL — ABNORMAL HIGH (ref 70–99)
Potassium: 4 mmol/L (ref 3.5–5.1)
Sodium: 139 mmol/L (ref 135–145)

## 2020-11-14 MED ORDER — GLYBURIDE 2.5 MG PO TABS
2.5000 mg | ORAL_TABLET | Freq: Every day | ORAL | Status: DC
  Administered 2020-11-15: 2.5 mg via ORAL
  Filled 2020-11-14 (×2): qty 1

## 2020-11-14 MED ORDER — SENNOSIDES-DOCUSATE SODIUM 8.6-50 MG PO TABS
1.0000 | ORAL_TABLET | Freq: Every day | ORAL | Status: DC
  Administered 2020-11-14 – 2020-11-15 (×2): 1 via ORAL
  Filled 2020-11-14 (×2): qty 1

## 2020-11-14 NOTE — Evaluation (Signed)
Occupational Therapy Evaluation Patient Details Name: [name] MRN: [id number] DOB: 19-Dec-1927 Today's Date: 11/14/2020    History of Present Illness presented to ER from STR secondary to progressive SOB; admitted for management of acute/chronic respiratory failure related to CHF exacerbation.   Clinical Impression   Pt seen for OT evaluation this date in setting of acute hospitalization d/t CHF exacerbation. Pt reports being MOD I for fxl mobility and ADLs at baseline, but has been requiring increased assistance since last hospitalization for hip sx. Pt presents this date with some decreased strength and fxl activity tolerance as well as decreased endurance and increased oxygen needs with mobilization. Pt on 4Lnc throughout session and does de-sat to 86-87% with transfer to commode and recliner. Pt requires MIN/MOD A for peri care after toileting and MIN/MOD A for transfers with RW. Pt left in chair with all needs met and in reach. Will continue to follow. Anticipate pt will require STR f/u OT services.    Follow Up Recommendations  SNF    Equipment Recommendations  3 in 1 bedside commode;Tub/shower seat    Recommendations for Other Services       Precautions / Restrictions Precautions Precautions: Fall Restrictions Weight Bearing Restrictions: Yes RLE Weight Bearing: Weight bearing as tolerated (R hip IM nailing in june)      Mobility Bed Mobility Overal bed mobility: Needs Assistance Bed Mobility: Supine to Sit     Supine to sit: Min assist;Mod assist;HOB elevated          Transfers Overall transfer level: Needs assistance Equipment used: Rolling walker (2 wheeled) Transfers: Sit to/from [facility] Sit to Stand: Min assist Stand pivot transfers: Mod assist       General transfer comment: increased time, cues to sequence, cues for safe hand placement.    Balance Overall balance assessment: Needs assistance Sitting-balance support: No  upper extremity supported;Feet supported Sitting balance-Leahy Scale: Good     Standing balance support: Bilateral upper extremity supported Standing balance-Leahy Scale: Fair                             ADL either performed or assessed with clinical judgement   ADL                                         General ADL Comments: Pt requires SETUP to MIN A for seated UB ADLs, MOD A for seated LB ADLs, MIN/MOD A for bed mobility with HOB elevated and MIN/MOD A for STS with RW.     Vision Patient Visual Report: No change from baseline       Perception     Praxis      Pertinent Vitals/Pain Pain Assessment: No/denies pain     Hand Dominance Right   Extremity/Trunk Assessment Upper Extremity Assessment Upper Extremity Assessment: Overall WFL for tasks assessed;Generalized weakness (ROM WFL. MMT grossly 4-/5)   Lower Extremity Assessment Lower Extremity Assessment:  (R slighlty weaker than L, L ROM WFL for seated LB ADLs, rquires assistance on R side)       Communication Communication Communication: HOH (extremely HOH, relies on lip reading at times; hearing aides located and plugged in for charging in room. When placed in patient's ear, does not appear to help much.)   Cognition Arousal/Alertness: Awake/alert Behavior During Therapy: WFL for tasks assessed/performed Overall Cognitive Status:  Within Functional Limits for tasks assessed                                     General Comments       Exercises Other Exercises Other Exercises: OT engages pt in toileting tasks. Ed re: role of OT in acute setting.   Shoulder Instructions      Home Living Family/patient expects to be discharged to:: Skilled nursing facility       Home Access: Stairs to enter                         Additional Comments: At baseline, lives with boyfriend of 68 years in single-level home, 7 steps to enter (bilat rails).  Has been at  [facility] for STR since hip fracture/repair; planning for transition to ALF once rehab completed (per chart)      Prior Functioning/Environment Level of Independence: Needs assistance        Comments: At baseline, ambulatory with 4WRW for limited household distances; manual WC for use as needed; does endorse multiple fall history.  Has been at STR since hip fracture/repair; planning for transition to ALF upon completion of rehab (per chart)        OT Problem List: Decreased strength;Decreased activity tolerance;Decreased knowledge of use of DME or AE;Impaired balance (sitting and/or standing);Cardiopulmonary status limiting activity      OT Treatment/Interventions: Self-care/ADL training;DME and/or AE instruction;Therapeutic activities;Balance training;Therapeutic exercise;Patient/family education    OT Goals(Current goals can be found in the care plan section) Acute Rehab OT Goals Patient Stated Goal: to get out of the bed OT Goal Formulation: With patient Time For Goal Achievement: 11/28/20 Potential to Achieve Goals: Good ADL Goals Pt Will Perform Lower Body Dressing: with min guard assist;with min assist;sit to/from stand Pt Will Transfer to Toilet: with min guard assist;with min assist;ambulating (with LRAD to [facility] ~10-15' away to improve tolerance for fxl mobility.) Pt Will Perform Toileting - Clothing Manipulation and hygiene: with min guard assist;with supervision;sit to/from stand Pt/caregiver will Perform Home Exercise Program: Increased strength;Both right and left upper extremity;With Supervision  OT Frequency: Min 1X/week   Barriers to D/C:            Co-evaluation              AM-PAC OT "6 Clicks" Daily Activity     Outcome Measure Help from another person eating meals?: None Help from another person taking care of personal grooming?: A Feagans Help from another person toileting, which includes using toliet, bedpan, or urinal?: A Lot Help from another  person bathing (including washing, rinsing, drying)?: A Lot Help from another person to put on and taking off regular upper body clothing?: A Apostol Help from another person to put on and taking off regular lower body clothing?: A Lot 6 Click Score: 16   End of Session Equipment Utilized During Treatment: Gait belt;Rolling walker;Oxygen Nurse Communication: Mobility status  Activity Tolerance: Patient tolerated treatment well Patient left: with call bell/phone within reach;in chair;with chair alarm set  OT Visit Diagnosis: Unsteadiness on feet (R26.81);Muscle weakness (generalized) (M62.81)                Time: 9718-2099 OT Time Calculation (min): 29 min Charges:  OT General Charges $OT Visit: 1 Visit OT Evaluation $OT Eval Moderate Complexity: 1 Mod OT Treatments $Self Care/Home Management : 8-22 mins  Gerrianne Scale, [place], OTR/L ascom [phone] 11/14/20, 3:30 PM

## 2020-11-14 NOTE — Progress Notes (Signed)
   11/12/20 0753  Assess: MEWS Score  Temp 98.7 F (37.1 C)  BP (!) 88/57  Pulse Rate (!) 133  Resp 20  SpO2 97 %  O2 Device Nasal Cannula  O2 Flow Rate (L/min) 5 L/min  Assess: MEWS Score  MEWS Temp 0  MEWS Systolic 1  MEWS Pulse 3  MEWS RR 0  MEWS LOC 0  MEWS Score 4  MEWS Score Color Red  Assess: if the MEWS score is Yellow or Red  Were vital signs taken at a resting state? Yes  Focused Assessment No change from prior assessment  Does the patient meet 2 or more of the SIRS criteria? No  MEWS guidelines implemented *See Row Information* Yes  Treat  MEWS Interventions Escalated (See documentation below)  Pain Scale 0-10  Pain Score 0  Take Vital Signs  Increase Vital Sign Frequency  Red: Q 1hr X 4 then Q 4hr X 4, if remains red, continue Q 4hrs  Escalate  MEWS: Escalate Red: discuss with charge nurse/RN and provider, consider discussing with RRT  Notify: Charge Nurse/RN  Name of Charge Nurse/RN Notified stephanie summers  Date Charge Nurse/RN Notified 11/12/20  Time Charge Nurse/RN Notified 0800  Notify: Provider  Provider Name/Title dr dahal  Date Provider Notified 11/12/20  Time Provider Notified 0800  Notification Type Page  Notification Reason Other (Comment) (bp 88/57, hr 133)  Provider response See new orders  Date of Provider Response 11/12/20  Time of Provider Response 0800  Notify: Rapid Response  Name of Rapid Response RN Notified sara icu charge  Date Rapid Response Notified 11/12/20  Time Rapid Response Notified 0800  Document  Patient Outcome Stabilized after interventions  Assess: SIRS CRITERIA  SIRS Temperature  0  SIRS Pulse 1  SIRS Respirations  0  SIRS WBC 0  SIRS Score Sum  1  Inserted for Cruzita Lederer RN

## 2020-11-14 NOTE — Progress Notes (Signed)
PROGRESS NOTE  [name]  DOB: 1928/02/17  PCP: Ria Bush, MD RCV:[id number]  DOA: 11/10/2020  LOS: 4 days  Hospital Day: 5   Chief Complaint  Patient presents with   Shortness of Breath    Brief narrative: ILEE [name] is a 87 y.o. female with PMH significant for DM2, HTN, CAD/CABG, systolic CHF (EF 40 to 10% 7/22), A. fib on Eliquis, chronic respiratory failure on home O2 at 2 L, CVA, PUD with history of perforated ulcer, who was hospitalized from 6/30-7/5 with respiratory failure secondary to CHF exacerbation.   Patient was brought to the ED on 8/9 from [place] with complaint of shortness of breath with worsening oxygen requirement.,  Progressively worsening for a week that did not improve with breathing treatments.   In the ED, patient required 6 L of supplemental oxygen. Chest x-ray showed cardiomegaly and interstitial edema CT angio chest negative for pulm embolism also showed ill-defined opacities in the left lung base Patient was started on IV Lasix, broad-spectrum IV antibiotics Admitted to hospitalist service.  Subjective: Patient was seen and examined this morning.   Sitting up in bed.  Not in distress.  She was just mobilized out of bed to the chair which is enough to make her tired.  She was briefly hypoxic but recovered.  Continues to remain on 4 L nasal cannula. Heart rate and blood pressure remained stable in last 24 hours.  Assessment/Plan: Acute exacerbation of chronic systolic CHF -Presented with shortness of breath elevated BNP, chest x-ray with interstitial edema -Recent echo from 7/22 with EF 40 to 45%. -Currently hemodynamically stable on metoprolol 75 mg twice daily.  Diuresis on hold because of unstable blood pressure. -Continue to monitor hemodynamics. -Net IO Since Admission: 1,800 mL [11/14/20 1444] -Continue to monitor for daily intake output, weight, blood pressure, BNP, renal function and electrolytes. Recent Labs   Lab 11/10/20 1209 11/11/20 0657 11/12/20 0648 11/13/20 0639 11/14/20 0652  BNP 693.8*  --   --   --   --   BUN 30* 17 20 29* 30*  CREATININE 0.79 0.64 0.70 0.83 0.68  K 4.4 3.5 3.2* 3.7 4.0  MG  --   --  1.5*  --   --     A. fib with RVR  -History of paroxysmal A. Fib.  -Difficult to control heart rate.  Seems better today after metoprolol increased to 75 mg twice daily.  Cardizem was stopped because of low blood pressures. -Continue Eliquis for anticoagulation.  Acute on chronic respiratory failure with hypoxia -Primarily due to exacerbation of congestive heart failure. -Initially pneumonia was suspected but no fever, normal WBC count, normal procalcitonin.  Pneumonia ruled out.  No need of antibiotic treatment -On 2 L oxygen at baseline. Currently requiring 4 L by nasal cannula.  Wean down as tolerated -Management of CHF as above.  Wean down oxygen supplementation as tolerated.  Acute metabolic encephalopathy -Mental status seems to be improving. Hard of hearing. Oriented x3 at this time.  Coronary artery disease s/p CABG -Continue metoprolol, pravastatin  Type 2 diabetes mellitus Hyperglycemia -A1c 7.3 on 08/28/2020.  Blood sugar level was elevated to 244 on admission. -Home meds include Januvia 50 mg daily, Actos 15 mg daily -Currently on Januvia and sliding scale insulin.  Actos on hold because of coexisting CHF.   -Blood sugar level is elevated close to 300 today.  Will start on glyburide 2.5 mg daily.   Recent Labs  Lab 11/13/20 1231 11/13/20  1611 11/13/20 2054 11/14/20 0904 11/14/20 1206  GLUCAP 198* 140* 157* 202* 282*    GAD (generalized anxiety disorder) -Continue Zoloft and alprazolam  Impaired mobility -PT eval obtained.  SNF recommended.    Code Status:   Code Status: DNR.  Discussed with patient's granddaughter at bedside. Nutritional status: Body mass index is 28.91 kg/m.     Diet:  Diet Order             Diet heart healthy/carb modified  Room service appropriate? Yes; Fluid consistency: Thin  Diet effective now                  DVT prophylaxis:  apixaban (ELIQUIS) tablet 2.5 mg Start: 11/11/20 2200 apixaban (ELIQUIS) tablet 2.5 mg   Antimicrobials: None Fluid: None Consultants: None Family Communication: Family not at bedside today.  Status is: Inpatient  Remains inpatient appropriate because: Needs monitoring of hemodynamics  Dispo: The patient is from: [facility]              Anticipated d/c is to: Back to the facility in 1 to 2 days.              Patient currently is not medically stable to d/c.   Difficult to place patient No     Infusions:    Scheduled Meds:  apixaban  2.5 mg Oral BID   diclofenac Sodium  4 g Topical TID   insulin aspart  0-5 Units Subcutaneous QHS   insulin aspart  0-9 Units Subcutaneous TID WC   linagliptin  5 mg Oral Daily   melatonin  2.5 mg Oral QHS   metoprolol tartrate  75 mg Oral BID   midodrine  5 mg Oral TID WC   pantoprazole  40 mg Oral BID   pravastatin  20 mg Oral Daily   senna-docusate  1 tablet Oral QHS   sertraline  100 mg Oral Daily    Antimicrobials: Anti-infectives (From admission, onward)    Start     Dose/Rate Route Frequency Ordered Stop   11/10/20 1700  ceFEPIme (MAXIPIME) 2 g in sodium chloride 0.9 % 100 mL IVPB        2 g 200 mL/hr over 30 Minutes Intravenous  Once 11/10/20 1652 11/10/20 1748   11/10/20 1700  vancomycin (VANCOCIN) IVPB 1000 mg/200 mL premix        1,000 mg 200 mL/hr over 60 Minutes Intravenous  Once 11/10/20 1652 11/10/20 1943       PRN meds: acetaminophen **OR** acetaminophen, ALPRAZolam, HYDROcodone-acetaminophen, nitroGLYCERIN, ondansetron **OR** ondansetron (ZOFRAN) IV, polyethylene glycol   Objective: Vitals:   11/14/20 1100 11/14/20 1200  BP: 110/70   Pulse: 78   Resp: 16 20  Temp: 98.2 F (36.8 C)   SpO2: 96%     Intake/Output Summary (Last 24 hours) at 11/14/2020 1444 Last data filed at 11/14/2020  1330 Gross per 24 hour  Intake 480 ml  Output --  Net 480 ml    Filed Weights   11/10/20 1156 11/12/20 1500 11/13/20 0300  Weight: 57.6 kg 59.3 kg 60.6 kg   Weight change:  Body mass index is 28.91 kg/m.   Physical Exam: General exam: Pleasant, elderly Caucasian female.  Sitting up in chair.  Not in physical distress Skin: No rashes, lesions or ulcers. HEENT: Atraumatic, normocephalic, no obvious bleeding Lungs: Clear to auscultate bilaterally CVS: Rate controlled A. fib GI/Abd soft, nontender, nondistend, bowel sound present CNS: Alert, awake, hard of hearing, oriented x3 Psychiatry: Mood appropriate  Extremities: No pedal edema, no calf redness  Data Review: I have personally reviewed the laboratory data and studies available.  Recent Labs  Lab 11/10/20 1209 11/11/20 0657 11/12/20 0648 11/13/20 0639 11/14/20 0652  WBC 9.5 9.9 9.8 9.5 9.8  NEUTROABS  --   --  7.6 6.9 7.1  HGB 10.1* 10.3* 10.1* 10.0* 9.5*  HCT 33.2* 32.8* 32.3* 32.3* 31.2*  MCV 97.1 97.0 94.2 94.7 94.3  PLT 256 199 236 240 253    Recent Labs  Lab 11/10/20 1209 11/11/20 0657 11/12/20 0648 11/13/20 0639 11/14/20 0652  NA 141 143 140 139 139  K 4.4 3.5 3.2* 3.7 4.0  CL 105 97* 93* 96* 95*  CO2 29 34* 36* 34* 33*  GLUCOSE 244* 148* 154* 145* 128*  BUN 30* 17 20 29* 30*  CREATININE 0.79 0.64 0.70 0.83 0.68  CALCIUM 9.3 8.9 8.7* 8.9 9.0  MG  --   --  1.5*  --   --   PHOS  --   --  3.2  --   --      F/u labs ordered Unresulted Labs (From admission, onward)    None       Signed, Terrilee Croak, MD [facility] 11/14/2020

## 2020-11-15 LAB — CULTURE, BLOOD (ROUTINE X 2)
Culture: NO GROWTH
Culture: NO GROWTH
Special Requests: ADEQUATE
Special Requests: ADEQUATE

## 2020-11-15 LAB — GLUCOSE, CAPILLARY
Glucose-Capillary: 117 mg/dL — ABNORMAL HIGH (ref 70–99)
Glucose-Capillary: 245 mg/dL — ABNORMAL HIGH (ref 70–99)
Glucose-Capillary: 35 mg/dL — CL (ref 70–99)
Glucose-Capillary: 64 mg/dL — ABNORMAL LOW (ref 70–99)
Glucose-Capillary: 66 mg/dL — ABNORMAL LOW (ref 70–99)
Glucose-Capillary: 86 mg/dL (ref 70–99)
Glucose-Capillary: 164 mg/dL — ABNORMAL HIGH (ref 70–99)

## 2020-11-15 NOTE — Progress Notes (Signed)
Patients bg found to be 35 provided pt juicex2 also dinner tray at bedside helped pt complete dinner ate 100 % contacted MD blood sugar rechecked bg 82

## 2020-11-15 NOTE — Progress Notes (Signed)
PROGRESS NOTE  [name]  DOB: 02/03/1928  PCP: Ria Bush, MD ZOX:[id number]  DOA: 11/10/2020  LOS: 5 days  Hospital Day: 6   Chief Complaint  Patient presents with   Shortness of Breath    Brief narrative: [name] is a 87 y.o. female with PMH significant for DM2, HTN, CAD/CABG, systolic CHF (EF 40 to 81% 7/22), A. fib on Eliquis, chronic respiratory failure on home O2 at 2 L, CVA, PUD with history of perforated ulcer, who was hospitalized from 6/30-7/5 with respiratory failure secondary to CHF exacerbation.   Patient was brought to the ED on 8/9 from [place] with complaint of shortness of breath with worsening oxygen requirement.,  Progressively worsening for a week that did not improve with breathing treatments.   In the ED, patient required 6 L of supplemental oxygen. Chest x-ray showed cardiomegaly and interstitial edema CT angio chest negative for pulm embolism also showed ill-defined opacities in the left lung base Patient was started on IV Lasix, broad-spectrum IV antibiotics Admitted to hospitalist service.  Subjective: Patient was seen and examined this morning.   Sitting up in chair.  Not in distress.  No new symptoms.  Feels tired.  Hemodynamically stable.  Continues to remain on 4 L oxygen by nasal cannula.  Assessment/Plan: Acute exacerbation of chronic systolic CHF -Presented with shortness of breath elevated BNP, chest x-ray with interstitial edema -Recent echo from 7/22 with EF 40 to 45%. -Currently hemodynamically stable on metoprolol 75 mg twice daily.  Diuresis on hold because of unstable blood pressure. -Continue to monitor hemodynamics. -Net IO Since Admission: 2,720 mL [11/15/20 1426] -Continue to monitor for daily intake output, weight, blood pressure, BNP, renal function and electrolytes. Recent Labs  Lab 11/10/20 1209 11/11/20 0657 11/12/20 0648 11/13/20 0639 11/14/20 0652  BNP 693.8*  --   --   --   --   BUN  30* 17 20 29* 30*  CREATININE 0.79 0.64 0.70 0.83 0.68  K 4.4 3.5 3.2* 3.7 4.0  MG  --   --  1.5*  --   --    A. fib with RVR  -History of paroxysmal A. Fib.  -Difficult to control heart rate.  Currently controlled on metoprolol 75 mg daily. Cardizem was stopped because of low blood pressures. -Continue Eliquis for anticoagulation.  Acute on chronic respiratory failure with hypoxia -Primarily due to exacerbation of congestive heart failure. -Initially pneumonia was suspected but no fever, normal WBC count, normal procalcitonin.  Pneumonia ruled out.  No need of antibiotic treatment -On 2 L oxygen at baseline. Currently requiring 4 L by nasal cannula.  Wean down as tolerated -Management of CHF as above.  Wean down oxygen supplementation as tolerated.  Acute metabolic encephalopathy -Mental status seems to be improving. Hard of hearing. Oriented x3 at this time.  Coronary artery disease s/p CABG -Continue metoprolol, pravastatin  Type 2 diabetes mellitus Hyperglycemia -A1c 7.3 on 08/28/2020.  Blood sugar level was elevated to 244 on admission. -Home meds include Januvia 50 mg daily, Actos 15 mg daily -Currently on Januvia and sliding scale insulin.  Actos on hold because of coexisting CHF.  Glyburide 2.5 mg daily was added. Recent Labs  Lab 11/14/20 1206 11/14/20 1646 11/14/20 2020 11/15/20 0811 11/15/20 1213  GLUCAP 282* 140* 120* 117* 245*   GAD (generalized anxiety disorder) -Continue Zoloft and alprazolam  Impaired mobility -PT eval obtained.  SNF recommended.    Code Status:   Code Status: DNR.  Nutritional status: Body mass index is 28.91 kg/m.     Diet:  Diet Order             Diet heart healthy/carb modified Room service appropriate? Yes; Fluid consistency: Thin  Diet effective now                  DVT prophylaxis:  apixaban (ELIQUIS) tablet 2.5 mg Start: 11/11/20 2200 apixaban (ELIQUIS) tablet 2.5 mg   Antimicrobials: None Fluid:  None Consultants: None Family Communication: Family not at bedside today.  Status is: Inpatient  Remains inpatient appropriate because: Needs monitoring of hemodynamics  Dispo: The patient is from: [facility]              Anticipated d/c is to: Back to the facility in 1 to 2 days.              Patient currently is not medically stable to d/c.   Difficult to place patient No     Infusions:    Scheduled Meds:  apixaban  2.5 mg Oral BID   diclofenac Sodium  4 g Topical TID   glyBURIDE  2.5 mg Oral Q breakfast   insulin aspart  0-5 Units Subcutaneous QHS   insulin aspart  0-9 Units Subcutaneous TID WC   linagliptin  5 mg Oral Daily   melatonin  2.5 mg Oral QHS   metoprolol tartrate  75 mg Oral BID   midodrine  5 mg Oral TID WC   pantoprazole  40 mg Oral BID   pravastatin  20 mg Oral Daily   senna-docusate  1 tablet Oral QHS   sertraline  100 mg Oral Daily    Antimicrobials: Anti-infectives (From admission, onward)    Start     Dose/Rate Route Frequency Ordered Stop   11/10/20 1700  ceFEPIme (MAXIPIME) 2 g in sodium chloride 0.9 % 100 mL IVPB        2 g 200 mL/hr over 30 Minutes Intravenous  Once 11/10/20 1652 11/10/20 1748   11/10/20 1700  vancomycin (VANCOCIN) IVPB 1000 mg/200 mL premix        1,000 mg 200 mL/hr over 60 Minutes Intravenous  Once 11/10/20 1652 11/10/20 1943       PRN meds: acetaminophen **OR** acetaminophen, ALPRAZolam, HYDROcodone-acetaminophen, nitroGLYCERIN, ondansetron **OR** ondansetron (ZOFRAN) IV, polyethylene glycol   Objective: Vitals:   11/15/20 0814 11/15/20 1213  BP: 124/79 103/67  Pulse: 98 99  Resp: 17 17  Temp: 97.9 F (36.6 C) 98.1 F (36.7 C)  SpO2: 97% 97%    Intake/Output Summary (Last 24 hours) at 11/15/2020 1426 Last data filed at 11/15/2020 1330 Gross per 24 hour  Intake 1320 ml  Output 400 ml  Net 920 ml   Filed Weights   11/10/20 1156 11/12/20 1500 11/13/20 0300  Weight: 57.6 kg 59.3 kg 60.6 kg   Weight  change:  Body mass index is 28.91 kg/m.   Physical Exam: General exam: Pleasant, elderly Caucasian female.  Sitting up in chair.  Not in physical distress Skin: No rashes, lesions or ulcers. HEENT: Atraumatic, normocephalic, no obvious bleeding Lungs: Clear to auscultation bilaterally CVS: Rate controlled A. fib GI/Abd soft, nontender, nondistend, bowel sound present CNS: Alert, awake, hard of hearing, oriented x3 Psychiatry: Mood appropriate Extremities: No pedal edema, no calf redness  Data Review: I have personally reviewed the laboratory data and studies available.  Recent Labs  Lab 11/10/20 1209 11/11/20 0657 11/12/20 0648 11/13/20 0639 11/14/20 0652  WBC 9.5 9.9  9.8 9.5 9.8  NEUTROABS  --   --  7.6 6.9 7.1  HGB 10.1* 10.3* 10.1* 10.0* 9.5*  HCT 33.2* 32.8* 32.3* 32.3* 31.2*  MCV 97.1 97.0 94.2 94.7 94.3  PLT 256 199 236 240 253   Recent Labs  Lab 11/10/20 1209 11/11/20 0657 11/12/20 0648 11/13/20 0639 11/14/20 0652  NA 141 143 140 139 139  K 4.4 3.5 3.2* 3.7 4.0  CL 105 97* 93* 96* 95*  CO2 29 34* 36* 34* 33*  GLUCOSE 244* 148* 154* 145* 128*  BUN 30* 17 20 29* 30*  CREATININE 0.79 0.64 0.70 0.83 0.68  CALCIUM 9.3 8.9 8.7* 8.9 9.0  MG  --   --  1.5*  --   --   PHOS  --   --  3.2  --   --     F/u labs ordered Unresulted Labs (From admission, onward)     Start     Ordered   11/16/20 0500  CBC with Differential/Platelet  Daily,   R     Question:  Specimen collection method  Answer:  Lab=Lab collect   11/15/20 0813   11/16/20 0301  Basic metabolic panel  Daily,   R     Question:  Specimen collection method  Answer:  Lab=Lab collect   11/15/20 0813            Signed, Terrilee Croak, MD [facility] 11/15/2020

## 2020-11-16 DIAGNOSIS — L821 Other seborrheic keratosis: Secondary | ICD-10-CM | POA: Diagnosis not present

## 2020-11-16 DIAGNOSIS — F419 Anxiety disorder, unspecified: Secondary | ICD-10-CM | POA: Diagnosis not present

## 2020-11-16 DIAGNOSIS — D229 Melanocytic nevi, unspecified: Secondary | ICD-10-CM | POA: Diagnosis not present

## 2020-11-16 DIAGNOSIS — I130 Hypertensive heart and chronic kidney disease with heart failure and stage 1 through stage 4 chronic kidney disease, or unspecified chronic kidney disease: Secondary | ICD-10-CM | POA: Diagnosis not present

## 2020-11-16 DIAGNOSIS — I5030 Unspecified diastolic (congestive) heart failure: Secondary | ICD-10-CM | POA: Diagnosis not present

## 2020-11-16 DIAGNOSIS — N390 Urinary tract infection, site not specified: Secondary | ICD-10-CM | POA: Diagnosis not present

## 2020-11-16 DIAGNOSIS — I5023 Acute on chronic systolic (congestive) heart failure: Secondary | ICD-10-CM | POA: Diagnosis not present

## 2020-11-16 DIAGNOSIS — S81801A Unspecified open wound, right lower leg, initial encounter: Secondary | ICD-10-CM | POA: Diagnosis not present

## 2020-11-16 DIAGNOSIS — S50812A Abrasion of left forearm, initial encounter: Secondary | ICD-10-CM | POA: Diagnosis not present

## 2020-11-16 DIAGNOSIS — Z7984 Long term (current) use of oral hypoglycemic drugs: Secondary | ICD-10-CM | POA: Diagnosis not present

## 2020-11-16 DIAGNOSIS — Z7401 Bed confinement status: Secondary | ICD-10-CM | POA: Diagnosis not present

## 2020-11-16 DIAGNOSIS — J9611 Chronic respiratory failure with hypoxia: Secondary | ICD-10-CM | POA: Diagnosis not present

## 2020-11-16 DIAGNOSIS — E1140 Type 2 diabetes mellitus with diabetic neuropathy, unspecified: Secondary | ICD-10-CM | POA: Diagnosis not present

## 2020-11-16 DIAGNOSIS — S0031XA Abrasion of nose, initial encounter: Secondary | ICD-10-CM | POA: Diagnosis not present

## 2020-11-16 DIAGNOSIS — G9341 Metabolic encephalopathy: Secondary | ICD-10-CM | POA: Diagnosis not present

## 2020-11-16 DIAGNOSIS — I872 Venous insufficiency (chronic) (peripheral): Secondary | ICD-10-CM | POA: Diagnosis not present

## 2020-11-16 DIAGNOSIS — I7025 Atherosclerosis of native arteries of other extremities with ulceration: Secondary | ICD-10-CM | POA: Diagnosis not present

## 2020-11-16 DIAGNOSIS — R5381 Other malaise: Secondary | ICD-10-CM | POA: Diagnosis not present

## 2020-11-16 DIAGNOSIS — E1122 Type 2 diabetes mellitus with diabetic chronic kidney disease: Secondary | ICD-10-CM | POA: Diagnosis not present

## 2020-11-16 DIAGNOSIS — J309 Allergic rhinitis, unspecified: Secondary | ICD-10-CM | POA: Diagnosis not present

## 2020-11-16 DIAGNOSIS — Z7901 Long term (current) use of anticoagulants: Secondary | ICD-10-CM | POA: Diagnosis not present

## 2020-11-16 DIAGNOSIS — R011 Cardiac murmur, unspecified: Secondary | ICD-10-CM | POA: Diagnosis not present

## 2020-11-16 DIAGNOSIS — E1151 Type 2 diabetes mellitus with diabetic peripheral angiopathy without gangrene: Secondary | ICD-10-CM | POA: Diagnosis not present

## 2020-11-16 DIAGNOSIS — M4800 Spinal stenosis, site unspecified: Secondary | ICD-10-CM | POA: Diagnosis not present

## 2020-11-16 DIAGNOSIS — S72141A Displaced intertrochanteric fracture of right femur, initial encounter for closed fracture: Secondary | ICD-10-CM | POA: Diagnosis not present

## 2020-11-16 DIAGNOSIS — F411 Generalized anxiety disorder: Secondary | ICD-10-CM | POA: Diagnosis not present

## 2020-11-16 DIAGNOSIS — N183 Chronic kidney disease, stage 3 unspecified: Secondary | ICD-10-CM | POA: Diagnosis not present

## 2020-11-16 DIAGNOSIS — I2510 Atherosclerotic heart disease of native coronary artery without angina pectoris: Secondary | ICD-10-CM | POA: Diagnosis not present

## 2020-11-16 DIAGNOSIS — I959 Hypotension, unspecified: Secondary | ICD-10-CM | POA: Diagnosis not present

## 2020-11-16 DIAGNOSIS — D1800 Hemangioma unspecified site: Secondary | ICD-10-CM | POA: Diagnosis not present

## 2020-11-16 DIAGNOSIS — R531 Weakness: Secondary | ICD-10-CM | POA: Diagnosis not present

## 2020-11-16 DIAGNOSIS — F418 Other specified anxiety disorders: Secondary | ICD-10-CM | POA: Diagnosis not present

## 2020-11-16 DIAGNOSIS — M545 Low back pain, unspecified: Secondary | ICD-10-CM | POA: Diagnosis not present

## 2020-11-16 DIAGNOSIS — I480 Paroxysmal atrial fibrillation: Secondary | ICD-10-CM | POA: Diagnosis not present

## 2020-11-16 DIAGNOSIS — Z872 Personal history of diseases of the skin and subcutaneous tissue: Secondary | ICD-10-CM | POA: Diagnosis not present

## 2020-11-16 DIAGNOSIS — R1312 Dysphagia, oropharyngeal phase: Secondary | ICD-10-CM | POA: Diagnosis not present

## 2020-11-16 DIAGNOSIS — Z951 Presence of aortocoronary bypass graft: Secondary | ICD-10-CM | POA: Diagnosis not present

## 2020-11-16 DIAGNOSIS — M25551 Pain in right hip: Secondary | ICD-10-CM | POA: Diagnosis not present

## 2020-11-16 DIAGNOSIS — Z8673 Personal history of transient ischemic attack (TIA), and cerebral infarction without residual deficits: Secondary | ICD-10-CM | POA: Diagnosis not present

## 2020-11-16 DIAGNOSIS — Z85828 Personal history of other malignant neoplasm of skin: Secondary | ICD-10-CM | POA: Diagnosis not present

## 2020-11-16 DIAGNOSIS — J189 Pneumonia, unspecified organism: Secondary | ICD-10-CM | POA: Diagnosis not present

## 2020-11-16 DIAGNOSIS — Z9181 History of falling: Secondary | ICD-10-CM | POA: Diagnosis not present

## 2020-11-16 DIAGNOSIS — I482 Chronic atrial fibrillation, unspecified: Secondary | ICD-10-CM | POA: Diagnosis not present

## 2020-11-16 DIAGNOSIS — Z9981 Dependence on supplemental oxygen: Secondary | ICD-10-CM | POA: Diagnosis not present

## 2020-11-16 DIAGNOSIS — L578 Other skin changes due to chronic exposure to nonionizing radiation: Secondary | ICD-10-CM | POA: Diagnosis not present

## 2020-11-16 DIAGNOSIS — E876 Hypokalemia: Secondary | ICD-10-CM | POA: Diagnosis not present

## 2020-11-16 LAB — GLUCOSE, CAPILLARY
Glucose-Capillary: 81 mg/dL (ref 70–99)
Glucose-Capillary: 72 mg/dL (ref 70–99)
Glucose-Capillary: 120 mg/dL — ABNORMAL HIGH (ref 70–99)

## 2020-11-16 LAB — BASIC METABOLIC PANEL
Anion gap: 8 (ref 5–15)
BUN: 24 mg/dL — ABNORMAL HIGH (ref 8–23)
CO2: 35 mmol/L — ABNORMAL HIGH (ref 22–32)
Calcium: 9.2 mg/dL (ref 8.9–10.3)
Chloride: 96 mmol/L — ABNORMAL LOW (ref 98–111)
Creatinine, Ser: 0.68 mg/dL (ref 0.44–1.00)
GFR, Estimated: >60 mL/min (ref >60)
Glucose, Bld: 67 mg/dL — ABNORMAL LOW (ref 70–99)
Potassium: 4 mmol/L (ref 3.5–5.1)
Sodium: 139 mmol/L (ref 135–145)

## 2020-11-16 LAB — CBC WITH DIFFERENTIAL/PLATELET
Abs Immature Granulocytes: 0.06 10*3/uL (ref 0.00–0.07)
Basophils Absolute: 0.1 10*3/uL (ref 0.0–0.1)
Basophils Relative: 1 %
Eosinophils Absolute: 0.3 10*3/uL (ref 0.0–0.5)
Eosinophils Relative: 3 %
HCT: 31.8 % — ABNORMAL LOW (ref 36.0–46.0)
Hemoglobin: 9.7 g/dL — ABNORMAL LOW (ref 12.0–15.0)
Immature Granulocytes: 1 %
Lymphocytes Relative: 11 %
Lymphs Abs: 1.3 10*3/uL (ref 0.7–4.0)
MCH: 28.7 pg (ref 26.0–34.0)
MCHC: 30.5 g/dL (ref 30.0–36.0)
MCV: 94.1 fL (ref 80.0–100.0)
Monocytes Absolute: 1.2 10*3/uL — ABNORMAL HIGH (ref 0.1–1.0)
Monocytes Relative: 10 %
Neutro Abs: 8.6 10*3/uL — ABNORMAL HIGH (ref 1.7–7.7)
Neutrophils Relative %: 74 %
Platelets: 262 10*3/uL (ref 150–400)
RBC: 3.38 MIL/uL — ABNORMAL LOW (ref 3.87–5.11)
RDW: 17.1 % — ABNORMAL HIGH (ref 11.5–15.5)
WBC: 11.5 10*3/uL — ABNORMAL HIGH (ref 4.0–10.5)
nRBC: 0 % (ref 0.0–0.2)

## 2020-11-16 LAB — RESP PANEL BY RT-PCR (FLU A&B, COVID) ARPGX2
Influenza A by PCR: NEGATIVE
Influenza B by PCR: NEGATIVE
SARS Coronavirus 2 by RT PCR: NEGATIVE

## 2020-11-16 MED ORDER — ALPRAZOLAM 0.25 MG PO TABS: 0.2500 mg | ORAL_TABLET | Freq: Two times a day (BID) | ORAL | Status: AC | PRN

## 2020-11-16 MED ORDER — METOPROLOL TARTRATE 75 MG PO TABS: 75.0000 mg | ORAL_TABLET | Freq: Two times a day (BID) | ORAL | Status: AC

## 2020-11-16 MED ORDER — SENNOSIDES-DOCUSATE SODIUM 8.6-50 MG PO TABS: 1.0000 | ORAL_TABLET | Freq: Every day | ORAL | Status: AC

## 2020-11-16 MED ORDER — APIXABAN 2.5 MG PO TABS: 2.5000 mg | ORAL_TABLET | Freq: Two times a day (BID) | ORAL | Status: AC

## 2020-11-16 MED ORDER — MIDODRINE HCL 5 MG PO TABS: 5.0000 mg | ORAL_TABLET | Freq: Three times a day (TID) | ORAL | Status: AC

## 2020-11-16 MED ORDER — FUROSEMIDE 20 MG PO TABS: ORAL_TABLET | ORAL | Status: AC

## 2020-11-16 NOTE — TOC Transition Note (Signed)
Transition of Care [facility]) - CM/SW Discharge Note   Patient Details  Name: [name] MRN: [id number] Date of Birth: [birth year]  Transition of Care [facility]) CM/SW Contact:  Alberteen Sam, LCSW Phone Number: 11/16/2020, 1:10 PM   Clinical Narrative:     Patient will DC to: [facility] Anticipated DC date:11/16/20 Family notified:granddaughter Susie Transport by: Johnanna Schneiders  Per MD patient ready for DC to [facility]. RN, patient, patient's family, and facility notified of DC. Discharge Summary sent to facility. RN given number for report  [phone] Room 508. DC packet on chart. Ambulance transport requested for patient.  CSW signing off.  Pricilla Riffle, LCSW    Final next level of care: Skilled Nursing Facility Barriers to Discharge: No Barriers Identified   Patient Goals and CMS Choice Patient states their goals for this hospitalization and ongoing recovery are:: to go to SNF CMS Medicare.gov Compare Post Acute Care list provided to:: Patient Represenative (must comment) Choice offered to / list presented to : Adult Children (Susie)  Discharge Placement                Patient to be transferred to facility by: [facility]   Patient and family notified of of transfer: 11/16/20  Discharge Plan and Services     Post Acute Care Choice:  (TBD)                               Social Determinants of Health (SDOH) Interventions     Readmission Risk Interventions Readmission Risk Prevention Plan 10/02/2020 09/04/2019  Transportation Screening Complete Complete  PCP or Specialist Appt within 3-5 Days Complete Complete  HRI or [place] Complete Complete  Social Work Consult for [place] Planning/Counseling Complete -  Palliative Care Screening Not Applicable Not Applicable  Medication Review Press photographer) Complete Complete  Some recent data might be hidden

## 2020-11-16 NOTE — Plan of Care (Signed)

## 2020-11-16 NOTE — TOC Progression Note (Signed)
Transition of Care [facility]) - Progression Note    Patient Details  Name: [name] MRN: [id number] Date of Birth: Jul 27, 1927  Transition of Care [facility]) CM/SW [place], [place] Phone Number: 11/16/2020, 10:15 AM  Clinical Narrative:     [facility] reports they are able to accept patient when medically stable to dc, will need updated covid test.   MD has been updated on above information.   Expected Discharge Plan:  (TBD) Barriers to Discharge: Continued Medical Work up  Expected Discharge Plan and Services Expected Discharge Plan:  (TBD)     Post Acute Care Choice:  (TBD) Living arrangements for the past 2 months:  (STR Radiation protection practitioner)                                       Social Determinants of Health (SDOH) Interventions    Readmission Risk Interventions Readmission Risk Prevention Plan 10/02/2020 09/04/2019  Transportation Screening Complete Complete  PCP or Specialist Appt within 3-5 Days Complete Complete  HRI or [place] Complete Complete  Social Work Consult for [place] Planning/Counseling Complete -  Palliative Care Screening Not Applicable Not Applicable  Medication Review Press photographer) Complete Complete  Some recent data might be hidden

## 2020-11-16 NOTE — Progress Notes (Addendum)
This RN provided report to Lahoma Crocker, nurse assuming care of patient at [facility]. All outstanding questions resolved.   R and L arm PIVs removed. Both Cannulas intact. Pt tolerated well. All patient belongings packed and in tow. EMS to transport patient via stretcher at time of departure.

## 2020-11-16 NOTE — Care Management Important Message (Signed)
Important Message  Patient Details  Name: [name] MRN: [id number] Date of Birth: 1928/01/09   Medicare Important Message Given:  Yes     Juliann Pulse A Glenn Christo 11/16/2020, 1:01 PM

## 2020-11-16 NOTE — Discharge Summary (Signed)
Physician Discharge Summary  [name] KGU:[id number] DOB: 23-May-1927 DOA: 11/10/2020  PCP: Ria Bush, MD  Admit date: 11/10/2020 Discharge date: 11/16/2020  Admitted From: [facility] Discharge disposition: [facility]   Code Status: DNR   Discharge Diagnosis:   Principal Problem:   Acute on chronic systolic CHF (congestive heart failure) ([place]) Active Problems:   Coronary artery disease   GAD (generalized anxiety disorder)   Diabetes mellitus type 2 with peripheral artery disease (HCC)   Acute on chronic respiratory failure with hypoxia (HCC)   Paroxysmal atrial fibrillation (HCC)   Atrial fibrillation, chronic ([place])   Chronic anticoagulation   Chief Complaint  Patient presents with   Shortness of Breath    Brief narrative: [name] is a 87 y.o. female with PMH significant for DM2, HTN, CAD/CABG, systolic CHF (EF 40 to 62% 7/22), A. fib on Eliquis, chronic respiratory failure on home O2 at 2 L, CVA, PUD with history of perforated ulcer, who was hospitalized from 6/30-7/5 with respiratory failure secondary to CHF exacerbation.   Patient was brought to the ED on 8/9 from [place] with complaint of shortness of breath with worsening oxygen requirement.,  Progressively worsening for a week that did not improve with breathing treatments.   In the ED, patient required 6 L of supplemental oxygen. Chest x-ray showed cardiomegaly and interstitial edema CT angio chest negative for pulm embolism also showed ill-defined opacities in the left lung base Patient was started on IV Lasix, broad-spectrum IV antibiotics Admitted to hospitalist service. See below for details  Subjective: Patient was seen and examined this morning.   Sitting up in chair.  Not in distress.   Remains on 4 L oxygen by nasal cannula.  Assessment/Plan: Acute exacerbation of chronic systolic CHF Essential hypertension/episodes of hypotension -Presented with shortness  of breath elevated BNP, chest x-ray with interstitial edema -Recent echo from 7/22 with EF 40 to 45%. -Currently hemodynamically stable on metoprolol 75 mg twice daily.  She was diuresed at first but because of hypotension, diuresis was held.  Currently she is maintaining normal blood pressure.  On 4 L oxygen by nasal cannula.  Prior to admission, she was on Lasix 40 mg daily.  I will discharge her on Lasix 20 mg daily and 20 mg as needed. -She has also been started on midodrine 5 mg 3 times daily for low blood pressure episodes.  We will continue the same post discharge. Recent Labs  Lab 11/10/20 1209 11/11/20 0657 11/12/20 0648 11/13/20 0639 11/14/20 0652 11/16/20 0515  BNP 693.8*  --   --   --   --   --   BUN 30* 17 20 29* 30* 24*  CREATININE 0.79 0.64 0.70 0.83 0.68 0.68  K 4.4 3.5 3.2* 3.7 4.0 4.0  MG  --   --  1.5*  --   --   --    A. fib with RVR  -History of paroxysmal A. Fib.  -Difficult to control heart rate.  Currently controlled on metoprolol 75 mg daily. Cardizem was stopped because of low blood pressures. -Continue Eliquis for anticoagulation.  Acute on chronic respiratory failure with hypoxia -Primarily due to exacerbation of congestive heart failure. -Initially pneumonia was suspected but no fever, normal WBC count, normal procalcitonin.  Pneumonia has been ruled out. No need of antibiotic treatment -On 2 L oxygen at baseline. Currently requiring 4 L by nasal cannula.  Wean down as tolerated -Management of CHF as above.   Acute  metabolic encephalopathy -Mental status seems to be improving. Hard of hearing. Oriented x3 at this time.  Coronary artery disease s/p CABG -Continue metoprolol, pravastatin  Type 2 diabetes mellitus Hyperglycemia/hypoglycemia -A1c 7.3 on 08/28/2020.  Blood sugar level was elevated to 244 on admission.  -Home meds include Januvia 50 mg daily, Actos 15 mg daily.  Januvia was continued.  Actos was stopped because of coexisting CHF. -Because  of hyperglycemia, I tried the patient on glyburide but her blood sugar level started to drop down and hence I stopped it.  At discharge I will continue Januvia only. Recent Labs  Lab 11/15/20 1800 11/15/20 2008 11/16/20 0722 11/16/20 0743 11/16/20 1150  GLUCAP 86 164* 81 72 120*   GAD (generalized anxiety disorder) -Continue Zoloft and alprazolam  Impaired mobility -PT eval obtained.   Allergies as of 11/16/2020       Reactions   Metformin And Related Diarrhea        Medication List     STOP taking these medications    diltiazem 60 MG 12 hr capsule Commonly known as: CARDIZEM SR       TAKE these medications    acetaminophen 325 MG tablet Commonly known as: TYLENOL Take 2 tablets (650 mg total) by mouth every 6 (six) hours.   Align 4 MG Caps Take 1 capsule (4 mg total) by mouth daily.   ALPRAZolam 0.25 MG tablet Commonly known as: XANAX Take 1 tablet (0.25 mg total) by mouth 2 (two) times daily as needed for anxiety.   alum & mag hydroxide-simeth 332-951-88 MG/5ML suspension Commonly known as: MAALOX PLUS Take 30 mLs by mouth every 4 (four) hours as needed for indigestion.   apixaban 2.5 MG Tabs tablet Commonly known as: ELIQUIS Take 1 tablet (2.5 mg total) by mouth 2 (two) times daily. What changed:  medication strength how much to take   B-complex with vitamin C tablet Take 1 tablet by mouth every morning.   bisacodyl 10 MG suppository Commonly known as: DULCOLAX Place 10 mg rectally daily as needed (constipation not relieved by MOM).   diclofenac sodium 1 % Gel Commonly known as: VOLTAREN Apply 1 application topically 3 (three) times daily. What changed: how much to take   Ensure Take 237 mLs by mouth every morning.   famotidine 20 MG tablet Commonly known as: Pepcid Take 1 tablet (20 mg total) by mouth daily as needed for heartburn (breakthrough heartburn).   fluticasone 50 MCG/ACT nasal spray Commonly known as: FLONASE Place 1 spray  into both nostrils daily as needed for allergies.   furosemide 20 MG tablet Commonly known as: LASIX 20 mg daily and an additional 20 mg daily as needed for fluid buildup, shortness of breath What changed:  how much to take how to take this when to take this additional instructions   GLUCOSAMINE-CHONDROITIN PO Take 1 tablet by mouth every morning.   ICaps Caps Take 1 capsule by mouth every morning.   melatonin 3 MG Tabs tablet Take 1 tablet (3 mg total) by mouth at bedtime.   Metoprolol Tartrate 75 MG Tabs Take 75 mg by mouth 2 (two) times daily. What changed:  medication strength how much to take   midodrine 5 MG tablet Commonly known as: PROAMATINE Take 1 tablet (5 mg total) by mouth 3 (three) times daily with meals.   nitroGLYCERIN 0.4 MG SL tablet Commonly known as: NITROSTAT Place 1 tablet (0.4 mg total) under the tongue every 5 (five) minutes as needed for chest pain.  ondansetron 4 MG tablet Commonly known as: ZOFRAN Take 1 tablet (4 mg total) by mouth every 6 (six) hours as needed for nausea.   OneTouch Delica Plus [id number] Misc Use to test blood sugar once daily dx code E11.9   OneTouch Verio test strip Generic drug: glucose blood USE TO TEST BLOOD SUGAR ONCE DAILY. DX:E11.9   OXYGEN Inhale 2 L into the lungs continuous.   pantoprazole 40 MG tablet Commonly known as: PROTONIX TAKE 1 TABLET BY MOUTH TWICE A DAY   polyethylene glycol powder 17 GM/SCOOP powder Commonly known as: GLYCOLAX/MIRALAX Dissolve 1 capful (17 g) in water and drink daily.   potassium chloride 10 MEQ CR capsule Commonly known as: MICRO-K TAKE ONE CAPSULE BY MOUTH ONCE DAILY   pravastatin 20 MG tablet Commonly known as: PRAVACHOL Take 1 tablet (20 mg total) by mouth daily.   RA SALINE ENEMA RE Place 1 enema rectally daily as needed (constipation not relieved by Bisacodyl suppository).   senna-docusate 8.6-50 MG tablet Commonly known as: Senokot-S Take 1 tablet by mouth  at bedtime.   sertraline 100 MG tablet Commonly known as: ZOLOFT Take 1 tablet (100 mg total) by mouth daily.   sitaGLIPtin 50 MG tablet Commonly known as: JANUVIA Take 50 mg by mouth every morning.               Discharge Care Instructions  (From admission, onward)           Start     Ordered   11/16/20 0000  Discharge wound care:       Comments: Foam dressing to left leg wounds, change Q 3 days or PRN soiling   11/16/20 1223            Discharge Instructions:  Diet Recommendation:  Discharge Diet Orders (From admission, onward)     Start     Ordered   11/16/20 0000  Diet - low sodium heart healthy        11/16/20 1223   11/16/20 0000  Diet Carb Modified        11/16/20 1223              Follow with Primary MD Ria Bush, MD in 7 days   Get CBC/BMP checked in next visit within 1 week by PCP or SNF MD ( we routinely change or add medications that can affect your baseline labs and fluid status, therefore we recommend that you get the mentioned basic workup next visit with your PCP, your PCP may decide not to get them or add new tests based on their clinical decision)  On your next visit with your PCP, please Get Medicines reviewed and adjusted.  Please request your PCP  to go over all Hospital Tests and Procedure/Radiological results at the follow up, please get all Hospital records sent to your Prim MD by signing hospital release before you go home.  Activity: As tolerated with Full fall precautions use walker/cane & assistance as needed  For Heart failure patients - Check your Weight same time everyday, if you gain over 2 pounds, or you develop in leg swelling, experience more shortness of breath or chest pain, call your Primary MD immediately. Follow Cardiac Low Salt Diet and 1.5 lit/day fluid restriction.  If you have smoked or chewed Tobacco in the last 2 yrs please stop smoking, stop any regular Alcohol  and or any Recreational drug  use.  If you experience worsening of your admission symptoms, develop shortness of breath, life  threatening emergency, suicidal or homicidal thoughts you must seek medical attention immediately by calling 911 or calling your MD immediately  if symptoms less severe.  You Must read complete instructions/literature along with all the possible adverse reactions/side effects for all the Medicines you take and that have been prescribed to you. Take any new Medicines after you have completely understood and accpet all the possible adverse reactions/side effects.   Do not drive, operate heavy machinery, perform activities at heights, swimming or participation in water activities or provide baby sitting services if your were admitted for syncope or siezures until you have seen by Primary MD or a Neurologist and advised to do so again.  Do not drive when taking Pain medications.  Do not take more than prescribed Pain, Sleep and Anxiety Medications  Wear Seat belts while driving.   Please note You were cared for by a hospitalist during your hospital stay. If you have any questions about your discharge medications or the care you received while you were in the hospital after you are discharged, you can call the unit and asked to speak with the hospitalist on call if the hospitalist that took care of you is not available. Once you are discharged, your primary care physician will handle any further medical issues. Please note that NO REFILLS for any discharge medications will be authorized once you are discharged, as it is imperative that you return to your primary care physician (or establish a relationship with a primary care physician if you do not have one) for your aftercare needs so that they can reassess your need for medications and monitor your lab values.    Follow ups:    Follow-up Information     Ria Bush, MD Follow up.   Specialty: Family Medicine Contact information: [place] [address] [phone]         Nahser, Wonda Cheng, MD .   Specialty: Cardiology Contact information: [address] [place] [address] [phone]                 Wound care:   Incision (Closed) 09/07/20 Hip Right (Active)  Date First Assessed/Time First Assessed: 09/07/20 1520   Location: Hip  Location Orientation: Right    Assessments 09/07/2020  3:52 PM 10/04/2020 11:28 AM  Dressing Type Adhesive bandage --  Dressing Clean;Dry;Intact --  Site / Wound Assessment Dressing in place / Unable to assess Clean;Dry  Drainage Amount None --     No Linked orders to display     Incision (Closed) 10/02/20 Thigh Anterior;Right (Active)  Date First Assessed/Time First Assessed: 10/02/20 0900   Location: Thigh  Location Orientation: Anterior;Right  Present on Admission: Yes    Assessments 10/04/2020 11:28 AM 10/05/2020  1:35 AM  Dressing Type -- Other (Comment)  Dressing -- Clean;Dry;Intact  Site / Wound Assessment Dry;Clean --  Closure -- Adhesive strips     No Linked orders to display    Discharge Exam:   Vitals:   11/16/20 0545 11/16/20 0721 11/16/20 0745 11/16/20 1150  BP:  117/65 114/71 111/70  Pulse:  91 (!) 104 68  Resp:  17 17 17   Temp:  98.3 F (36.8 C) 97.7 F (36.5 C) 97.9 F (36.6 C)  TempSrc:  Oral    SpO2:  100% 97% 98%  Weight: 61.2 kg     Height:        Body mass index is 29.2 kg/m.  General exam: Pleasant elderly  Caucasian female.  Feels weak.  Not in distress. Skin: No rashes, lesions or ulcers. HEENT: Atraumatic, normocephalic, no obvious bleeding Lungs: Diminished air entry in both bases, on 4 L oxygen and examined CVS: Regular rate and rhythm, no murmur GI/Abd soft, nontender, nondistended, bowel sound present CNS: Alert, awake, hard of hearing, oriented x3 Psychiatry: Depressed look Extremities: No pedal edema, no calf tenderness  Time coordinating discharge: 35 minutes   The results of significant  diagnostics from this hospitalization (including imaging, microbiology, ancillary and laboratory) are listed below for reference.    Procedures and Diagnostic Studies:   DG Chest 2 View  Result Date: 11/10/2020 CLINICAL DATA:  SOB. Additional provided: Patient reports history of CHF. EXAM: CHEST - 2 VIEW COMPARISON:  CT angiogram chest 10/01/2020. Prior chest radiographs 10/01/2020 and earlier. FINDINGS: Prior median sternotomy/CABG. Unchanged cardiomegaly. Aortic atherosclerosis. Bilateral interstitial prominence, likely reflecting interstitial edema. Superimposed mild ill-defined opacity at the left lung base favored to reflect atelectasis. Trace bilateral pleural effusions. No appreciable pneumothorax. No evidence of acute bony abnormality. A curvilinear density projects over the right clavicle, likely overlying the patient. IMPRESSION: Cardiomegaly with interstitial edema. Superimposed mild ill-defined opacity within the left lung base, favored to reflect atelectasis. Pneumonia cannot be excluded and clinical correlation is recommended. Trace bilateral pleural effusions. Aortic Atherosclerosis (ICD10-I70.0). Electronically Signed   By: Kellie Simmering DO   On: 11/10/2020 13:47   CT Angio Chest PE W and/or Wo Contrast  Result Date: 11/10/2020 CLINICAL DATA:  Short of breath. EXAM: CT ANGIOGRAPHY CHEST WITH CONTRAST TECHNIQUE: Multidetector CT imaging of the chest was performed using the standard protocol during bolus administration of intravenous contrast. Multiplanar CT image reconstructions and MIPs were obtained to evaluate the vascular anatomy. CONTRAST:  50mL OMNIPAQUE IOHEXOL 350 MG/ML SOLN COMPARISON:  CT 10/01/2020. FINDINGS: Cardiovascular: No filling defects within the pulmonary arteries to suggest acute pulmonary embolism. Mediastinum/Nodes: Small mediastinal lymph nodes. No change from prior. No pericardial fluid. Post CABG anatomy Lungs/Pleura: Moderate bilateral pleural effusions slightly  improved from comparison exam. Mild ground-glass opacities in the upper lobes. Improvement in airspace disease compared to prior. No focal consolidation. No pulmonary infarction. Upper Abdomen: Limited view of the liver, kidneys, pancreas are unremarkable. Normal adrenal glands. Musculoskeletal: No aggressive osseous lesion. Review of the MIP images confirms the above findings. IMPRESSION: 1. No evidence acute pulmonary embolism. 2. Bilateral pleural effusions and mild pulmonary edema improved from comparison exam. Electronically Signed   By: Suzy Bouchard M.D.   On: 11/10/2020 17:45     Labs:   Basic Metabolic Panel: Recent Labs  Lab 11/11/20 0657 11/12/20 0648 11/13/20 0639 11/14/20 0652 11/16/20 0515  NA 143 140 139 139 139  K 3.5 3.2* 3.7 4.0 4.0  CL 97* 93* 96* 95* 96*  CO2 34* 36* 34* 33* 35*  GLUCOSE 148* 154* 145* 128* 67*  BUN 17 20 29* 30* 24*  CREATININE 0.64 0.70 0.83 0.68 0.68  CALCIUM 8.9 8.7* 8.9 9.0 9.2  MG  --  1.5*  --   --   --   PHOS  --  3.2  --   --   --    GFR Estimated Creatinine Clearance: 34.4 mL/min (by C-G formula based on SCr of 0.68 mg/dL). Liver Function Tests: No results for input(s): AST, ALT, ALKPHOS, BILITOT, PROT, ALBUMIN in the last 168 hours. No results for input(s): LIPASE, AMYLASE in the last 168 hours. No results for input(s): AMMONIA in the last 168 hours. Coagulation profile No  results for input(s): INR, PROTIME in the last 168 hours.  CBC: Recent Labs  Lab 11/11/20 0657 11/12/20 0648 11/13/20 0639 11/14/20 0652 11/16/20 0515  WBC 9.9 9.8 9.5 9.8 11.5*  NEUTROABS  --  7.6 6.9 7.1 8.6*  HGB 10.3* 10.1* 10.0* 9.5* 9.7*  HCT 32.8* 32.3* 32.3* 31.2* 31.8*  MCV 97.0 94.2 94.7 94.3 94.1  PLT 199 236 240 253 262   Cardiac Enzymes: No results for input(s): CKTOTAL, CKMB, CKMBINDEX, TROPONINI in the last 168 hours. BNP: Invalid input(s): POCBNP CBG: Recent Labs  Lab 11/15/20 1800 11/15/20 2008 11/16/20 0722 11/16/20 0743  11/16/20 1150  GLUCAP 86 164* 81 72 120*   D-Dimer No results for input(s): DDIMER in the last 72 hours. Hgb A1c No results for input(s): HGBA1C in the last 72 hours. Lipid Profile No results for input(s): CHOL, HDL, LDLCALC, TRIG, CHOLHDL, LDLDIRECT in the last 72 hours. Thyroid function studies No results for input(s): TSH, T4TOTAL, T3FREE, THYROIDAB in the last 72 hours.  Invalid input(s): [name]3 Anemia work up No results for input(s): VITAMINB12, FOLATE, FERRITIN, TIBC, IRON, RETICCTPCT in the last 72 hours. Microbiology Recent Results (from the past 240 hour(s))  Blood culture (routine x 2)     Status: None   Collection Time: 11/10/20  5:49 PM   Specimen: BLOOD  Result Value Ref Range Status   Specimen Description BLOOD RIGHT ANTECUBITAL  Final   Special Requests   Final    BOTTLES DRAWN AEROBIC AND ANAEROBIC Blood Culture adequate volume   Culture   Final    NO GROWTH 5 DAYS Performed at [facility], [address], [place], [place] [address]    Report Status 11/15/2020 FINAL  Final  Blood culture (routine x 2)     Status: None   Collection Time: 11/10/20  5:49 PM   Specimen: BLOOD  Result Value Ref Range Status   Specimen Description BLOOD LEFT ANTECUBITAL  Final   Special Requests   Final    BOTTLES DRAWN AEROBIC AND ANAEROBIC Blood Culture adequate volume   Culture   Final    NO GROWTH 5 DAYS Performed at [facility], [address] [place], [place] [address]    Report Status 11/15/2020 FINAL  Final  Resp Panel by RT-PCR (Flu A&B, Covid) Nasopharyngeal Swab     Status: None   Collection Time: 11/10/20  6:13 PM   Specimen: Nasopharyngeal Swab; Nasopharyngeal(NP) swabs in vial transport medium  Result Value Ref Range Status   SARS Coronavirus 2 by RT PCR NEGATIVE NEGATIVE Final    Comment: (NOTE) SARS-CoV-2 target nucleic acids are NOT DETECTED.  The SARS-CoV-2 RNA is generally detectable in upper respiratory specimens during the acute  phase of infection. The lowest concentration of SARS-CoV-2 viral copies this assay can detect is 138 copies/mL. A negative result does not preclude SARS-Cov-2 infection and should not be used as the sole basis for treatment or other patient management decisions. A negative result may occur with  improper specimen collection/handling, submission of specimen other than nasopharyngeal swab, presence of viral mutation(s) within the areas targeted by this assay, and inadequate number of viral copies(<138 copies/mL). A negative result must be combined with clinical observations, patient history, and epidemiological information. The expected result is Negative.  Fact Sheet for Patients:  EntrepreneurPulse.com.au  Fact Sheet for Healthcare Providers:  IncredibleEmployment.be  This test is no t yet approved or cleared by the [place] FDA and  has been authorized for detection and/or diagnosis of  SARS-CoV-2 by FDA under an Emergency Use Authorization (EUA). This EUA will remain  in effect (meaning this test can be used) for the duration of the COVID-19 declaration under Section 564(b)(1) of the Act, 21 U.S.C.section 360bbb-3(b)(1), unless the authorization is terminated  or revoked sooner.       Influenza A by PCR NEGATIVE NEGATIVE Final   Influenza B by PCR NEGATIVE NEGATIVE Final    Comment: (NOTE) The Xpert Xpress SARS-CoV-2/FLU/RSV plus assay is intended as an aid in the diagnosis of influenza from Nasopharyngeal swab specimens and should not be used as a sole basis for treatment. Nasal washings and aspirates are unacceptable for Xpert Xpress SARS-CoV-2/FLU/RSV testing.  Fact Sheet for Patients: EntrepreneurPulse.com.au  Fact Sheet for Healthcare Providers: IncredibleEmployment.be  This test is not yet approved or cleared by the [place] FDA and has been authorized for detection and/or diagnosis of  SARS-CoV-2 by FDA under an Emergency Use Authorization (EUA). This EUA will remain in effect (meaning this test can be used) for the duration of the COVID-19 declaration under Section 564(b)(1) of the Act, 21 U.S.C. section 360bbb-3(b)(1), unless the authorization is terminated or revoked.  Performed at [facility], [place], [place] [address]   Resp Panel by RT-PCR (Flu A&B, Covid) Nasopharyngeal Swab     Status: None   Collection Time: 11/16/20 10:34 AM   Specimen: Nasopharyngeal Swab; Nasopharyngeal(NP) swabs in vial transport medium  Result Value Ref Range Status   SARS Coronavirus 2 by RT PCR NEGATIVE NEGATIVE Final    Comment: (NOTE) SARS-CoV-2 target nucleic acids are NOT DETECTED.  The SARS-CoV-2 RNA is generally detectable in upper respiratory specimens during the acute phase of infection. The lowest concentration of SARS-CoV-2 viral copies this assay can detect is 138 copies/mL. A negative result does not preclude SARS-Cov-2 infection and should not be used as the sole basis for treatment or other patient management decisions. A negative result may occur with  improper specimen collection/handling, submission of specimen other than nasopharyngeal swab, presence of viral mutation(s) within the areas targeted by this assay, and inadequate number of viral copies(<138 copies/mL). A negative result must be combined with clinical observations, patient history, and epidemiological information. The expected result is Negative.  Fact Sheet for Patients:  EntrepreneurPulse.com.au  Fact Sheet for Healthcare Providers:  IncredibleEmployment.be  This test is no t yet approved or cleared by the [place] FDA and  has been authorized for detection and/or diagnosis of SARS-CoV-2 by FDA under an Emergency Use Authorization (EUA). This EUA will remain  in effect (meaning this test can be used) for the duration of  the COVID-19 declaration under Section 564(b)(1) of the Act, 21 U.S.C.section 360bbb-3(b)(1), unless the authorization is terminated  or revoked sooner.       Influenza A by PCR NEGATIVE NEGATIVE Final   Influenza B by PCR NEGATIVE NEGATIVE Final    Comment: (NOTE) The Xpert Xpress SARS-CoV-2/FLU/RSV plus assay is intended as an aid in the diagnosis of influenza from Nasopharyngeal swab specimens and should not be used as a sole basis for treatment. Nasal washings and aspirates are unacceptable for Xpert Xpress SARS-CoV-2/FLU/RSV testing.  Fact Sheet for Patients: EntrepreneurPulse.com.au  Fact Sheet for Healthcare Providers: IncredibleEmployment.be  This test is not yet approved or cleared by the [place] FDA and has been authorized for detection and/or diagnosis of SARS-CoV-2 by FDA under an Emergency Use Authorization (EUA). This EUA will remain in effect (meaning this test can be used) for the  duration of the COVID-19 declaration under Section 564(b)(1) of the Act, 21 U.S.C. section 360bbb-3(b)(1), unless the authorization is terminated or revoked.  Performed at [facility], [address] [place], [place] [address]      Signed: Terrilee Croak  [facility] 11/16/2020, 12:23 PM

## 2020-11-16 NOTE — Plan of Care (Signed)
  Problem: Education: Goal: Knowledge of General Education information will improve Description: Including pain rating scale, medication(s)/side effects and non-pharmacologic comfort measures 11/16/2020 1155 by Cristela Blue, RN Outcome: Progressing 11/16/2020 1155 by Cristela Blue, RN Outcome: Progressing   Problem: Health Behavior/Discharge Planning: Goal: Ability to manage health-related needs will improve 11/16/2020 1155 by Cristela Blue, RN Outcome: Progressing 11/16/2020 1155 by Cristela Blue, RN Outcome: Progressing   Problem: Clinical Measurements: Goal: Ability to maintain clinical measurements within normal limits will improve 11/16/2020 1155 by Cristela Blue, RN Outcome: Progressing 11/16/2020 1155 by Cristela Blue, RN Outcome: Progressing Goal: Will remain free from infection 11/16/2020 1155 by Cristela Blue, RN Outcome: Progressing 11/16/2020 1155 by Cristela Blue, RN Outcome: Progressing Goal: Diagnostic test results will improve 11/16/2020 1155 by Cristela Blue, RN Outcome: Progressing 11/16/2020 1155 by Cristela Blue, RN Outcome: Progressing Goal: Respiratory complications will improve 11/16/2020 1155 by Cristela Blue, RN Outcome: Progressing 11/16/2020 1155 by Cristela Blue, RN Outcome: Progressing Goal: Cardiovascular complication will be avoided 11/16/2020 1155 by Cristela Blue, RN Outcome: Progressing 11/16/2020 1155 by Cristela Blue, RN Outcome: Progressing   Problem: Activity: Goal: Risk for activity intolerance will decrease 11/16/2020 1155 by Cristela Blue, RN Outcome: Progressing 11/16/2020 1155 by Cristela Blue, RN Outcome: Progressing   Problem: Nutrition: Goal: Adequate nutrition will be maintained 11/16/2020 1155 by Cristela Blue, RN Outcome: Progressing 11/16/2020 1155 by Cristela Blue, RN Outcome: Progressing   Problem: Coping: Goal: Level of anxiety will decrease 11/16/2020 1155 by Cristela Blue, RN Outcome:  Progressing 11/16/2020 1155 by Cristela Blue, RN Outcome: Progressing   Problem: Elimination: Goal: Will not experience complications related to bowel motility 11/16/2020 1155 by Cristela Blue, RN Outcome: Progressing 11/16/2020 1155 by Cristela Blue, RN Outcome: Progressing Goal: Will not experience complications related to urinary retention 11/16/2020 1155 by Cristela Blue, RN Outcome: Progressing 11/16/2020 1155 by Cristela Blue, RN Outcome: Progressing   Problem: Pain Managment: Goal: General experience of comfort will improve 11/16/2020 1155 by Cristela Blue, RN Outcome: Progressing 11/16/2020 1155 by Cristela Blue, RN Outcome: Progressing   Problem: Safety: Goal: Ability to remain free from injury will improve 11/16/2020 1155 by Cristela Blue, RN Outcome: Progressing 11/16/2020 1155 by Cristela Blue, RN Outcome: Progressing   Problem: Skin Integrity: Goal: Risk for impaired skin integrity will decrease 11/16/2020 1155 by Cristela Blue, RN Outcome: Progressing 11/16/2020 1155 by Cristela Blue, RN Outcome: Progressing

## 2020-11-17 DIAGNOSIS — Z9181 History of falling: Secondary | ICD-10-CM | POA: Diagnosis not present

## 2020-11-17 DIAGNOSIS — J9611 Chronic respiratory failure with hypoxia: Secondary | ICD-10-CM | POA: Diagnosis not present

## 2020-11-17 DIAGNOSIS — E1151 Type 2 diabetes mellitus with diabetic peripheral angiopathy without gangrene: Secondary | ICD-10-CM | POA: Diagnosis not present

## 2020-11-17 DIAGNOSIS — I482 Chronic atrial fibrillation, unspecified: Secondary | ICD-10-CM | POA: Diagnosis not present

## 2020-11-17 DIAGNOSIS — F411 Generalized anxiety disorder: Secondary | ICD-10-CM | POA: Diagnosis not present

## 2020-11-17 DIAGNOSIS — J189 Pneumonia, unspecified organism: Secondary | ICD-10-CM | POA: Diagnosis not present

## 2020-11-17 DIAGNOSIS — I5030 Unspecified diastolic (congestive) heart failure: Secondary | ICD-10-CM | POA: Diagnosis not present

## 2020-11-26 ENCOUNTER — Ambulatory Visit: Payer: Medicare Other | Admitting: Family

## 2020-12-01 ENCOUNTER — Other Ambulatory Visit: Payer: Self-pay

## 2020-12-01 ENCOUNTER — Encounter: Payer: Self-pay | Admitting: Orthopaedic Surgery

## 2020-12-01 ENCOUNTER — Ambulatory Visit (INDEPENDENT_AMBULATORY_CARE_PROVIDER_SITE_OTHER): Payer: Medicare Other

## 2020-12-01 ENCOUNTER — Ambulatory Visit (INDEPENDENT_AMBULATORY_CARE_PROVIDER_SITE_OTHER): Payer: Medicare Other | Admitting: Orthopaedic Surgery

## 2020-12-01 DIAGNOSIS — S72141A Displaced intertrochanteric fracture of right femur, initial encounter for closed fracture: Secondary | ICD-10-CM

## 2020-12-01 DIAGNOSIS — M25551 Pain in right hip: Secondary | ICD-10-CM

## 2020-12-01 NOTE — Progress Notes (Signed)
Post-Op Visit Note   Patient: [name]           Date of Birth: [birth year]           MRN: [id number] Visit Date: 12/01/2020 PCP: Ria Bush, MD   Assessment & Plan:  Chief Complaint:  Chief Complaint  Patient presents with   Right Hip - Pain    Visit Diagnoses:  1. Displaced intertrochanteric fracture of right femur, initial encounter for closed fracture ([place])   2. Pain in right hip     Plan: Patient is a pleasant 87 year old female who comes in today 12 weeks status post right hip IM nail from an intertrochanteric femur fracture, date of surgery 09/07/2020.  She has been doing well.  She is still living at [facility] where she is getting daily physical therapy.  She is back to primarily ambulating with a walker which is her baseline.  She denies any pain to the right hip.  Examination of the right hip reveals painless range of motion.  She is neurovascular intact distally.  At this point, x-rays are stable.  She will continue with physical therapy.  Follow-up with [place] in 2 months time for a repeat evaluation and x-rays of the right femur.  Call with concerns or questions.  Follow-Up Instructions: Return in about 2 months (around 01/31/2021).   Orders:  Orders Placed This Encounter  Procedures   XR FEMUR, MIN 2 VIEWS RIGHT   No orders of the defined types were placed in this encounter.   Imaging: XR FEMUR, MIN 2 VIEWS RIGHT  Result Date: 12/01/2020 X-rays demonstrate stable fixation of the fracture without hardware complication   PMFS History: Patient Active Problem List   Diagnosis Date Noted   Acute on chronic systolic CHF (congestive heart failure) ([place]) 11/10/2020   Chronic anticoagulation 11/10/2020   Hypomagnesemia 10/03/2020   Depression with anxiety 10/01/2020   DVT (deep venous thrombosis) ([place]) 10/01/2020   Acute on chronic diastolic CHF (congestive heart failure) ([place]) 10/01/2020   Atrial fibrillation, chronic ([place]) 09/22/2020    Cerebral atrophy ([place]) 09/22/2020   Atherosclerosis of aorta ([place]) 09/22/2020   Osteoporosis 09/22/2020   Bilirubinemia 09/22/2020   Leukocytosis 09/22/2020   Pyuria 09/22/2020   History of recurrent UTIs 09/22/2020   Hearing loss 09/22/2020   Chronic respiratory failure with hypoxia, on home O2 therapy ([place]) 09/22/2020   SIRS (systemic inflammatory response syndrome) ([place])    Altered mental status, unspecified 09/21/2020   Postoperative anemia due to acute blood loss 09/13/2020   Displaced intertrochanteric fracture of right femur, initial encounter for closed fracture ([place]) 09/05/2020   Hip fracture due to osteoporosis with routine healing 09/05/2020   Financial difficulty 02/26/2020   UTI (urinary tract infection) 10/01/2019   CKD stage 3 due to type 2 diabetes mellitus ([place]) 09/02/2019   History of CVA in adulthood 08/23/2019   Polypharmacy 07/27/2019   E coli bacteremia 02/13/2019   Dyspnea    Acute on chronic respiratory failure with hypoxia and hypercapnia (HCC)    Chronic leg pain 11/14/2018   Atherosclerosis of native arteries of the extremities with ulceration ([place]) 08/21/2018   Chronic constipation 06/18/2018   Advanced directives, counseling/discussion 01/29/2018   Bilateral hearing loss 01/29/2018   Spinal stenosis of lumbosacral region 00/93/8182   Systolic murmur 99/37/1696   De Quervain's tenosynovitis, left 01/09/2017   Encounter for chronic pain management 02/09/2016   Paroxysmal atrial fibrillation (HCC)    Acute on chronic respiratory failure with hypoxia (  [place]) 09/27/2015   Varicose veins of left lower extremity with inflammation, with ulcer of calf limited to breakdown of skin ([place]) 05/22/2015   Varicose veins of right lower extremity with inflammation 01/23/2015   Diabetes mellitus type 2 with peripheral artery disease (HCC)    Chronic venous insufficiency    Bleeding from varicose veins of lower extremity, left 02/13/2014   Diabetic polyneuropathy ([place])  11/19/2013   Right shoulder pain 08/19/2013   Abdominal pain 05/14/2012   Unsteady gait 12/13/2011   Chronic lower back pain 09/14/2011   Hyperlipidemia associated with type 2 diabetes mellitus ([place]) 09/06/2010   GAD (generalized anxiety disorder) 03/15/2010   Obesity, Class I, BMI 30-34.9 03/15/2010   Hypertension associated with type 2 diabetes mellitus ([place]) 03/15/2010   (HFpEF) heart failure with preserved ejection fraction ([place]) 03/15/2010   Coronary artery disease    Past Medical History:  Diagnosis Date   Actinic keratosis 01/22/2020   right forearm   Acute deep vein thrombosis (DVT) of distal vein of right lower extremity ([place]) 12/2013   Acute thrombus of R proximal gastrocnemius vein. Symptomatic provoked distal DVT   Anxiety    Atherosclerosis of aorta ([place]) 09/22/2020   Basal cell carcinoma 10/30/2018   R nasal bridge, treated with Moh's   Basal cell carcinoma 01/22/2020   left forearm, nodular   Cellulitis of leg, left 01/16/2014   Cerebral atrophy ([place]) 09/22/2020   Head CT 09/21/20 finding   Cerebral infarct ([place])    Head CT 09/21/20: remote LEFT frontoparietal white matter infarct.   Cerumen impaction 04/16/2013   Saw Dr Richardson Landry 02/2018 s/p cleaning   CHF (congestive heart failure) ([place])    Chronic back pain 03/2012   lumbar DDD with herniation - s/p L S1 transforaminal ESI (10/2012 Dr. Sharlet Salina)   Chronic respiratory failure with hypoxia, on home O2 therapy ([place]) 09/22/2020   Chronic venous insufficiency    Coronary artery disease    CABG 1998   Cystocele    Depression    Diabetes mellitus    type 2, followed by endo.   Diastolic dysfunction    Diverticulitis of duodenum    Diverticulosis    Duodenal ulcer perforation ([place]) 02/08/2019   Erythema of breast 04/18/2018   Hearing loss 09/22/2020   Hemorrhoids    Hip fracture ([place]) 09/05/2020   History of heart attack    History of recurrent UTIs 09/22/2020   Hx of echocardiogram    a. Echo 10/13: mild LVH,  EF 55-60%, Gr 1 diast dysfn, PASP 32   Hyperlipidemia    Hypertension    Irritable bowel syndrome    Lethargy 07/27/2019   Microcytic anemia    MRSA cellulitis 03/31/2018   Osteoporosis 09/22/2020   PAD (peripheral artery disease) ([place]) 2013   ABI: R 0.91, L 0.83   Peptic ulcer disease    Pneumonia 02/15/2019   Postmenopausal osteoporosis 01/2019   T score of -2.6   PUD (peptic ulcer disease)    Sphenoid sinusitis 09/21/2020   Head CT 09/21/20: New RIGHT sphenoid sinus fluid/acute sinusitis.   Varicose veins    Venous ulcer of leg ([place]) 12/12/2013   Required prolonged treatment by wound center   Viral gastroenteritis due to [place] virus 05/13/2015   hospitalization    Family History  Problem Relation Age of Onset   Hypertension Mother    Stroke Mother    Diabetes Father    Diabetes Sister    Diabetes Brother    Heart disease Son  Colon cancer Neg Hx     Past Surgical History:  Procedure Laterality Date   ABDOMINAL HYSTERECTOMY  04/04/1973   TAH,BSO   ABI  04/05/2011   R 0.91, L 0.83   ANGIOPLASTY Right 09/06/2018   Lucky Cowboy, MD (Vasc): PCTA peroneal a; PCTL SFA; PTLA popliteal a; DES stent right SFR and popliteal a   APPENDECTOMY     per pt report   CARDIOVASCULAR STRESS TEST  04/04/2013   low risk myoview ([place])   CATARACT SURGERY     CHOLECYSTECTOMY     COLONOSCOPY  02/02/2010   extensive diverticulosis throughout, small internal hemorrhoids, rec rpt 5 yrs (Dr. Allyn Kenner)   [place] GRAFT  04/04/1996   dexa scan  09/03/2011   femur -0.8, forearm 0.6 => normal   ENDOVENOUS ABLATION SAPHENOUS VEIN W/ LASER Left 03/04/2014   Kellie Simmering   [facility]  04/04/2012   L S1 transforaminal ESI x2 ([place]) without improvement   FEMORAL ARTERY STENT Right 09/06/2018   Lucky Cowboy, MD (Vasc): PCTA peroneal a; PCTL SFA; PTLA popliteal a; DES stent right SFR and popliteal a   HAND SURGERY     INTRAMEDULLARY (IM) NAIL INTERTROCHANTERIC Right 09/07/2020   Procedure:  INTRAMEDULLARY (IM) NAIL INTERTROCHANTRIC;  Surgeon: Leandrew Koyanagi, MD;  Location: [facility];  Service: Orthopedics;  Laterality: Right;   lexiscan myoview  03/05/2011   Small basal inferolateral and anterolateral reversible perfusion defect suggests ischemia.  EF was normal.   old infarct, no new ischemia   LOWER EXTREMITY ANGIOGRAPHY Right 09/06/2018   Procedure: LOWER EXTREMITY ANGIOGRAPHY;  Surgeon: Algernon Huxley, MD;  Location: [facility];  Service: Cardiovascular;  Laterality: Right;   POPLITEAL ARTERY STENT Right 09/06/2018   Lucky Cowboy, MD (Vasc): PCTA peroneal a; PCTL SFA; PTLA popliteal a; DES stent right SFR and popliteal a   toe surgery     TYMPANOPLASTY  1960s   R side   Vault prolapse A & P repair  04/05/2007   [facility]   Social History   Occupational History   Occupation: Waitress   Tobacco Use   Smoking status: Former    Types: Cigarettes    Quit date: 03/05/1979    Years since quitting: 41.7   Smokeless tobacco: Never  Vaping Use   Vaping Use: Never used  Substance and Sexual Activity   Alcohol use: No    Alcohol/week: 0.0 standard drinks   Drug use: No   Sexual activity: Not Currently    Comment: 1st intercourse 87yo.--- 2 sexual partners

## 2020-12-03 ENCOUNTER — Ambulatory Visit (INDEPENDENT_AMBULATORY_CARE_PROVIDER_SITE_OTHER): Payer: Medicare Other | Admitting: Dermatology

## 2020-12-03 ENCOUNTER — Other Ambulatory Visit: Payer: Self-pay

## 2020-12-03 DIAGNOSIS — N390 Urinary tract infection, site not specified: Secondary | ICD-10-CM | POA: Diagnosis not present

## 2020-12-03 DIAGNOSIS — Z9981 Dependence on supplemental oxygen: Secondary | ICD-10-CM | POA: Diagnosis not present

## 2020-12-03 DIAGNOSIS — R011 Cardiac murmur, unspecified: Secondary | ICD-10-CM | POA: Diagnosis not present

## 2020-12-03 DIAGNOSIS — D229 Melanocytic nevi, unspecified: Secondary | ICD-10-CM | POA: Diagnosis not present

## 2020-12-03 DIAGNOSIS — L821 Other seborrheic keratosis: Secondary | ICD-10-CM

## 2020-12-03 DIAGNOSIS — N183 Chronic kidney disease, stage 3 unspecified: Secondary | ICD-10-CM | POA: Diagnosis not present

## 2020-12-03 DIAGNOSIS — I480 Paroxysmal atrial fibrillation: Secondary | ICD-10-CM | POA: Diagnosis not present

## 2020-12-03 DIAGNOSIS — E1140 Type 2 diabetes mellitus with diabetic neuropathy, unspecified: Secondary | ICD-10-CM | POA: Diagnosis not present

## 2020-12-03 DIAGNOSIS — L578 Other skin changes due to chronic exposure to nonionizing radiation: Secondary | ICD-10-CM | POA: Diagnosis not present

## 2020-12-03 DIAGNOSIS — Z951 Presence of aortocoronary bypass graft: Secondary | ICD-10-CM | POA: Diagnosis not present

## 2020-12-03 DIAGNOSIS — E876 Hypokalemia: Secondary | ICD-10-CM | POA: Diagnosis not present

## 2020-12-03 DIAGNOSIS — M4800 Spinal stenosis, site unspecified: Secondary | ICD-10-CM | POA: Diagnosis not present

## 2020-12-03 DIAGNOSIS — J309 Allergic rhinitis, unspecified: Secondary | ICD-10-CM | POA: Diagnosis not present

## 2020-12-03 DIAGNOSIS — I2510 Atherosclerotic heart disease of native coronary artery without angina pectoris: Secondary | ICD-10-CM | POA: Diagnosis not present

## 2020-12-03 DIAGNOSIS — Z7901 Long term (current) use of anticoagulants: Secondary | ICD-10-CM | POA: Diagnosis not present

## 2020-12-03 DIAGNOSIS — E1151 Type 2 diabetes mellitus with diabetic peripheral angiopathy without gangrene: Secondary | ICD-10-CM | POA: Diagnosis not present

## 2020-12-03 DIAGNOSIS — G9341 Metabolic encephalopathy: Secondary | ICD-10-CM | POA: Diagnosis not present

## 2020-12-03 DIAGNOSIS — Z7984 Long term (current) use of oral hypoglycemic drugs: Secondary | ICD-10-CM | POA: Diagnosis not present

## 2020-12-03 DIAGNOSIS — S81801A Unspecified open wound, right lower leg, initial encounter: Secondary | ICD-10-CM

## 2020-12-03 DIAGNOSIS — J9611 Chronic respiratory failure with hypoxia: Secondary | ICD-10-CM | POA: Diagnosis not present

## 2020-12-03 DIAGNOSIS — R1312 Dysphagia, oropharyngeal phase: Secondary | ICD-10-CM | POA: Diagnosis not present

## 2020-12-03 DIAGNOSIS — M545 Low back pain, unspecified: Secondary | ICD-10-CM | POA: Diagnosis not present

## 2020-12-03 DIAGNOSIS — Z85828 Personal history of other malignant neoplasm of skin: Secondary | ICD-10-CM | POA: Diagnosis not present

## 2020-12-03 DIAGNOSIS — I5030 Unspecified diastolic (congestive) heart failure: Secondary | ICD-10-CM | POA: Diagnosis not present

## 2020-12-03 DIAGNOSIS — S50812A Abrasion of left forearm, initial encounter: Secondary | ICD-10-CM

## 2020-12-03 DIAGNOSIS — D1800 Hemangioma unspecified site: Secondary | ICD-10-CM

## 2020-12-03 DIAGNOSIS — I130 Hypertensive heart and chronic kidney disease with heart failure and stage 1 through stage 4 chronic kidney disease, or unspecified chronic kidney disease: Secondary | ICD-10-CM | POA: Diagnosis not present

## 2020-12-03 DIAGNOSIS — Z8673 Personal history of transient ischemic attack (TIA), and cerebral infarction without residual deficits: Secondary | ICD-10-CM | POA: Diagnosis not present

## 2020-12-03 DIAGNOSIS — I959 Hypotension, unspecified: Secondary | ICD-10-CM | POA: Diagnosis not present

## 2020-12-03 DIAGNOSIS — S0031XA Abrasion of nose, initial encounter: Secondary | ICD-10-CM

## 2020-12-03 DIAGNOSIS — Z872 Personal history of diseases of the skin and subcutaneous tissue: Secondary | ICD-10-CM | POA: Diagnosis not present

## 2020-12-03 DIAGNOSIS — T148XXA Other injury of unspecified body region, initial encounter: Secondary | ICD-10-CM

## 2020-12-03 DIAGNOSIS — I872 Venous insufficiency (chronic) (peripheral): Secondary | ICD-10-CM | POA: Diagnosis not present

## 2020-12-03 DIAGNOSIS — I7025 Atherosclerosis of native arteries of other extremities with ulceration: Secondary | ICD-10-CM | POA: Diagnosis not present

## 2020-12-03 DIAGNOSIS — E1122 Type 2 diabetes mellitus with diabetic chronic kidney disease: Secondary | ICD-10-CM | POA: Diagnosis not present

## 2020-12-03 MED ORDER — MUPIROCIN 2 % EX OINT: 1.0000 "application " | TOPICAL_OINTMENT | Freq: Every day | CUTANEOUS | 1 refills | Status: AC

## 2020-12-03 NOTE — Progress Notes (Signed)
Follow-Up Visit   Subjective  [name] is a 87 y.o. female who presents for the following: Follow-up (Patient was scheduled for FBSE today but patient has broken her hip and is on oxygen and not up for all over check. Patient is in wheelchair and accompanied by her granddaughter, [name] who has noticed a few spots at patient's face. She also advises that patient had a spot at right temple but some of it has come off. Patient does have hx of AK's and BCC. ).   The following portions of the chart were reviewed this encounter and updated as appropriate:   Tobacco  Allergies  Meds  Problems  Med Hx  Surg Hx  Fam Hx      Review of Systems:  No other skin or systemic complaints except as noted in HPI or Assessment and Plan.  Objective  Well appearing patient in no apparent distress; mood and affect are within normal limits.  A focused examination was performed including face, legs, arms, back. Relevant physical exam findings are noted in the Assessment and Plan. FBSE deferred due to limited mobility today.  left nasofacial angle, left forearm Crusted excoriation  Right Lower Leg 7.0 x 1.5cm denuded erosion   Assessment & Plan  Excoriation left nasofacial angle, left forearm  Favor excoriation  Apply vaseline and cover with band-aid daily  Call for worsening or if not going away  Open wound Right Lower Leg  S/p trauma  Cleanse daily with Bactine wound cleanser, pat dry and apply mupirocin ointment. Cover with band-aid. Do this daily until completely healed.  mupirocin ointment (BACTROBAN) 2 % - Right Lower Leg Apply 1 application topically daily. Apply to affected area at right lower leg.  Seborrheic Keratoses - Stuck-on, waxy, tan-brown papules and/or plaques  - Benign-appearing - Discussed benign etiology and prognosis. - Observe - Call for any changes  Hemangiomas - Red papules - Discussed benign nature - Observe - Call for any changes  History  of Basal Cell Carcinoma of the Skin - No evidence of recurrence today - Recommend regular full body skin exams - Recommend daily broad spectrum sunscreen SPF 30+ to sun-exposed areas, reapply every 2 hours as needed.  - Call if any new or changing lesions are noted between office visits  History of PreCancerous Actinic Keratosis  - site(s) of PreCancerous Actinic Keratosis clear today. - these may recur and new lesions may form requiring treatment to prevent transformation into skin cancer - observe for new or changing spots and contact [place] for appointment if occur - photoprotection with sun protective clothing; sunglasses and broad spectrum sunscreen with SPF of at least 30 + and frequent self skin exams recommended - yearly exams by a dermatologist recommended for persons with history of PreCancerous Actinic Keratoses  Melanocytic Nevi - Tan-brown and/or pink-flesh-colored symmetric macules and papules - Benign appearing on exam today - Observation - Call clinic for new or changing moles - Recommend daily use of broad spectrum spf 30+ sunscreen to sun-exposed areas.   Actinic Damage - chronic, secondary to cumulative UV radiation exposure/sun exposure over time - diffuse scaly erythematous macules with underlying dyspigmentation - Recommend daily broad spectrum sunscreen SPF 30+ to sun-exposed areas, reapply every 2 hours as needed.  - Recommend staying in the shade or wearing long sleeves, sun glasses (UVA+UVB protection) and wide brim hats (4-inch brim around the entire circumference of the hat). - Call for new or changing lesions.  Return in about 1  year (around 12/03/2021) for TBSE.  [name], RMA, am acting as scribe for Forest Gleason, MD .  Documentation: I have reviewed the above documentation for accuracy and completeness, and I agree with the above.  Forest Gleason, MD

## 2020-12-03 NOTE — Patient Instructions (Signed)
Wound at right lower leg - Cleanse daily with over the counter Bactine wound cleanser, pat dry and apply mupirocin ointment. Cover with band-aid.   If you have any questions or concerns for your doctor, please call our main line at [phone] and press option 4 to reach your doctor's medical assistant. If no one answers, please leave a voicemail as directed and we will return your call as soon as possible. Messages left after 4 pm will be answered the following business day.   You may also send [place] a message via [employer]. We typically respond to MyChart messages within 1-2 business days.  For prescription refills, please ask your pharmacy to contact our office. Our fax number is [phone].  If you have an urgent issue when the clinic is closed that cannot wait until the next business day, you can page your doctor at the number below.    Please note that while we do our best to be available for urgent issues outside of office hours, we are not available 24/7.   If you have an urgent issue and are unable to reach [place], you may choose to seek medical care at your doctor's office, retail clinic, urgent care center, or emergency room.  If you have a medical emergency, please immediately call 911 or go to the emergency department.  Pager Numbers  - Dr. Nehemiah Massed: [phone]  - Dr. Laurence Ferrari: [phone]  - Dr. Nicole Kindred: [phone]  In the event of inclement weather, please call our main line at [phone] for an update on the status of any delays or closures.  Dermatology Medication Tips: Please keep the boxes that topical medications come in in order to help keep track of the instructions about where and how to use these. Pharmacies typically print the medication instructions only on the boxes and not directly on the medication tubes.   If your medication is too expensive, please contact our office at [phone] option 4 or send [place] a message through [place].   We are unable to tell what  your co-pay for medications will be in advance as this is different depending on your insurance coverage. However, we may be able to find a substitute medication at lower cost or fill out paperwork to get insurance to cover a needed medication.   If a prior authorization is required to get your medication covered by your insurance company, please allow [place] 1-2 business days to complete this process.  Drug prices often vary depending on where the prescription is filled and some pharmacies may offer cheaper prices.  The website www.goodrx.com contains coupons for medications through different pharmacies. The prices here do not account for what the cost may be with help from insurance (it may be cheaper with your insurance), but the website can give you the price if you did not use any insurance.  - You can print the associated coupon and take it with your prescription to the pharmacy.  - You may also stop by our office during regular business hours and pick up a GoodRx coupon card.  - If you need your prescription sent electronically to a different pharmacy, notify our office through [facility] or by phone at [phone] option 4.

## 2020-12-10 DIAGNOSIS — F419 Anxiety disorder, unspecified: Secondary | ICD-10-CM | POA: Diagnosis not present

## 2020-12-10 DIAGNOSIS — R0902 Hypoxemia: Secondary | ICD-10-CM | POA: Diagnosis not present

## 2020-12-15 ENCOUNTER — Telehealth: Payer: Self-pay

## 2020-12-15 NOTE — Chronic Care Management (AMB) (Addendum)
Chronic Care Management Pharmacy Assistant   Name: [name]  MRN: [id number] DOB: [birth year]  Reason for Encounter: Adherence Review  Recent office visits:  None since lasts CCM contact  Recent consult visits:  12/03/20- Dermatology-Patient presented for follow up of skin lesions,excoriation,recommend sunscreen SPF 30+ 10/23/20-Cardiology-Patient presented for hospital follow up.recommend taking her Pepcid daily when she is having breakthrough reflux symptoms.  I have provided a note for her nursing facility that if her resting heart rate is consistently greater than 100 bpm we should increase her metoprolol to 75 mg twice daily.Could consider addition of very low-dose ARB vs MRA at follow-up. Follow up 4 months  Hospital visits:  11/10/20 thru 11/16/20-Patient presented with SOB.Patient treated with IV Lasix, started on cefepime and vancomycin for possible pneumonia.Labs ordered, xrays, admission  Medications: Outpatient Encounter Medications as of 12/15/2020  Medication Sig   acetaminophen (TYLENOL) 325 MG tablet Take 2 tablets (650 mg total) by mouth every 6 (six) hours.   ALPRAZolam (XANAX) 0.25 MG tablet Take 1 tablet (0.25 mg total) by mouth 2 (two) times daily as needed for anxiety.   alum & mag hydroxide-simeth (MAALOX PLUS) 400-400-40 MG/5ML suspension Take 30 mLs by mouth every 4 (four) hours as needed for indigestion.   apixaban (ELIQUIS) 2.5 MG TABS tablet Take 1 tablet (2.5 mg total) by mouth 2 (two) times daily.   B Complex-C (B-COMPLEX WITH VITAMIN C) tablet Take 1 tablet by mouth every morning.   bisacodyl (DULCOLAX) 10 MG suppository Place 10 mg rectally daily as needed (constipation not relieved by MOM).   diclofenac sodium (VOLTAREN) 1 % GEL Apply 1 application topically 3 (three) times daily.   Ensure (ENSURE) Take 237 mLs by mouth every morning.   famotidine (PEPCID) 20 MG tablet Take 1 tablet (20 mg total) by mouth daily as needed for heartburn (breakthrough  heartburn).   fluticasone (FLONASE) 50 MCG/ACT nasal spray Place 1 spray into both nostrils daily as needed for allergies.   furosemide (LASIX) 20 MG tablet 20 mg daily and an additional 20 mg daily as needed for fluid buildup, shortness of breath   GLUCOSAMINE-CHONDROITIN PO Take 1 tablet by mouth every morning.   Lancets (ONETOUCH DELICA PLUS MPNTIR44R) MISC Use to test blood sugar once daily dx code E11.9   melatonin 3 MG TABS tablet Take 1 tablet (3 mg total) by mouth at bedtime.   metoprolol tartrate 75 MG TABS Take 75 mg by mouth 2 (two) times daily.   midodrine (PROAMATINE) 5 MG tablet Take 1 tablet (5 mg total) by mouth 3 (three) times daily with meals.   Multiple Vitamins-Minerals (ICAPS) CAPS Take 1 capsule by mouth every morning.   mupirocin ointment (BACTROBAN) 2 % Apply 1 application topically daily. Apply to affected area at right lower leg.   nitroGLYCERIN (NITROSTAT) 0.4 MG SL tablet Place 1 tablet (0.4 mg total) under the tongue every 5 (five) minutes as needed for chest pain.   ondansetron (ZOFRAN) 4 MG tablet Take 1 tablet (4 mg total) by mouth every 6 (six) hours as needed for nausea.   ONETOUCH VERIO test strip USE TO TEST BLOOD SUGAR ONCE DAILY. DX:E11.9   OXYGEN Inhale 2 L into the lungs continuous.   pantoprazole (PROTONIX) 40 MG tablet TAKE 1 TABLET BY MOUTH TWICE A DAY   polyethylene glycol powder (GLYCOLAX/MIRALAX) 17 GM/SCOOP powder Dissolve 1 capful (17 g) in water and drink daily.   potassium chloride (MICRO-K) 10 MEQ CR capsule TAKE ONE CAPSULE BY  MOUTH ONCE DAILY   pravastatin (PRAVACHOL) 20 MG tablet Take 1 tablet (20 mg total) by mouth daily.   Probiotic Product (ALIGN) 4 MG CAPS Take 1 capsule (4 mg total) by mouth daily.   senna-docusate (SENOKOT-S) 8.6-50 MG tablet Take 1 tablet by mouth at bedtime.   sertraline (ZOLOFT) 100 MG tablet Take 1 tablet (100 mg total) by mouth daily.   sitaGLIPtin (JANUVIA) 50 MG tablet Take 50 mg by mouth every morning.   Sodium  Phosphates (RA SALINE ENEMA RE) Place 1 enema rectally daily as needed (constipation not relieved by Bisacodyl suppository).   No facility-administered encounter medications on file as of 12/15/2020.    Attempted contact with [name] 3 times on 12/15/20,12/16/20,12/17/20. Unsuccessful outreach. Will attempt contact next month.     Patient is > 5 days past due for refill on the following medications per chart history:  [facility] Medications: Medication Name/mg Last Fill Days Supply Januvia 50mg    09/15/20 Pravastatin 20mg   08/19/20 90   Care Gaps: Annual wellness visit in last year? No  last 2019 Most Recent BP reading: 125/75  113-P (11/10/20)  Cardiology appointment on 01/11/21   [name], CPP notified  Avel Sensor, [facility] Assistant [phone]  I have reviewed the care management and care coordination activities outlined in this encounter and I am certifying that I agree with the content of this note. No further action required.  [name], PharmD Clinical Pharmacist [facility] [phone]

## 2020-12-16 ENCOUNTER — Encounter: Payer: Self-pay | Admitting: Dermatology

## 2020-12-19 ENCOUNTER — Other Ambulatory Visit: Payer: Self-pay | Admitting: Family Medicine

## 2020-12-25 DIAGNOSIS — E1140 Type 2 diabetes mellitus with diabetic neuropathy, unspecified: Secondary | ICD-10-CM | POA: Diagnosis not present

## 2020-12-25 DIAGNOSIS — I959 Hypotension, unspecified: Secondary | ICD-10-CM | POA: Diagnosis not present

## 2020-12-25 DIAGNOSIS — I5030 Unspecified diastolic (congestive) heart failure: Secondary | ICD-10-CM | POA: Diagnosis not present

## 2020-12-25 DIAGNOSIS — I130 Hypertensive heart and chronic kidney disease with heart failure and stage 1 through stage 4 chronic kidney disease, or unspecified chronic kidney disease: Secondary | ICD-10-CM | POA: Diagnosis not present

## 2020-12-25 DIAGNOSIS — E1122 Type 2 diabetes mellitus with diabetic chronic kidney disease: Secondary | ICD-10-CM | POA: Diagnosis not present

## 2020-12-25 DIAGNOSIS — R1312 Dysphagia, oropharyngeal phase: Secondary | ICD-10-CM | POA: Diagnosis not present

## 2020-12-25 DIAGNOSIS — I480 Paroxysmal atrial fibrillation: Secondary | ICD-10-CM | POA: Diagnosis not present

## 2020-12-25 DIAGNOSIS — J9611 Chronic respiratory failure with hypoxia: Secondary | ICD-10-CM | POA: Diagnosis not present

## 2020-12-25 DIAGNOSIS — K219 Gastro-esophageal reflux disease without esophagitis: Secondary | ICD-10-CM | POA: Diagnosis not present

## 2020-12-25 DIAGNOSIS — Z9981 Dependence on supplemental oxygen: Secondary | ICD-10-CM | POA: Diagnosis not present

## 2020-12-25 DIAGNOSIS — Z8673 Personal history of transient ischemic attack (TIA), and cerebral infarction without residual deficits: Secondary | ICD-10-CM | POA: Diagnosis not present

## 2020-12-25 DIAGNOSIS — G9341 Metabolic encephalopathy: Secondary | ICD-10-CM | POA: Diagnosis not present

## 2020-12-25 DIAGNOSIS — Z7984 Long term (current) use of oral hypoglycemic drugs: Secondary | ICD-10-CM | POA: Diagnosis not present

## 2020-12-25 DIAGNOSIS — R011 Cardiac murmur, unspecified: Secondary | ICD-10-CM | POA: Diagnosis not present

## 2020-12-25 DIAGNOSIS — Z7901 Long term (current) use of anticoagulants: Secondary | ICD-10-CM | POA: Diagnosis not present

## 2020-12-25 DIAGNOSIS — E1151 Type 2 diabetes mellitus with diabetic peripheral angiopathy without gangrene: Secondary | ICD-10-CM | POA: Diagnosis not present

## 2020-12-25 DIAGNOSIS — M4800 Spinal stenosis, site unspecified: Secondary | ICD-10-CM | POA: Diagnosis not present

## 2020-12-25 DIAGNOSIS — Z951 Presence of aortocoronary bypass graft: Secondary | ICD-10-CM | POA: Diagnosis not present

## 2020-12-25 DIAGNOSIS — I7025 Atherosclerosis of native arteries of other extremities with ulceration: Secondary | ICD-10-CM | POA: Diagnosis not present

## 2020-12-25 DIAGNOSIS — I872 Venous insufficiency (chronic) (peripheral): Secondary | ICD-10-CM | POA: Diagnosis not present

## 2020-12-25 DIAGNOSIS — N183 Chronic kidney disease, stage 3 unspecified: Secondary | ICD-10-CM | POA: Diagnosis not present

## 2020-12-25 DIAGNOSIS — N390 Urinary tract infection, site not specified: Secondary | ICD-10-CM | POA: Diagnosis not present

## 2020-12-25 DIAGNOSIS — J309 Allergic rhinitis, unspecified: Secondary | ICD-10-CM | POA: Diagnosis not present

## 2020-12-25 DIAGNOSIS — E7800 Pure hypercholesterolemia, unspecified: Secondary | ICD-10-CM | POA: Diagnosis not present

## 2020-12-25 DIAGNOSIS — E876 Hypokalemia: Secondary | ICD-10-CM | POA: Diagnosis not present

## 2020-12-27 DIAGNOSIS — G9341 Metabolic encephalopathy: Secondary | ICD-10-CM | POA: Diagnosis not present

## 2020-12-27 DIAGNOSIS — I872 Venous insufficiency (chronic) (peripheral): Secondary | ICD-10-CM | POA: Diagnosis not present

## 2020-12-27 DIAGNOSIS — I5030 Unspecified diastolic (congestive) heart failure: Secondary | ICD-10-CM | POA: Diagnosis not present

## 2020-12-27 DIAGNOSIS — I959 Hypotension, unspecified: Secondary | ICD-10-CM | POA: Diagnosis not present

## 2020-12-27 DIAGNOSIS — Z8673 Personal history of transient ischemic attack (TIA), and cerebral infarction without residual deficits: Secondary | ICD-10-CM | POA: Diagnosis not present

## 2020-12-27 DIAGNOSIS — J309 Allergic rhinitis, unspecified: Secondary | ICD-10-CM | POA: Diagnosis not present

## 2020-12-28 DIAGNOSIS — I959 Hypotension, unspecified: Secondary | ICD-10-CM | POA: Diagnosis not present

## 2020-12-28 DIAGNOSIS — G9341 Metabolic encephalopathy: Secondary | ICD-10-CM | POA: Diagnosis not present

## 2020-12-28 DIAGNOSIS — I872 Venous insufficiency (chronic) (peripheral): Secondary | ICD-10-CM | POA: Diagnosis not present

## 2020-12-28 DIAGNOSIS — I5030 Unspecified diastolic (congestive) heart failure: Secondary | ICD-10-CM | POA: Diagnosis not present

## 2020-12-28 DIAGNOSIS — Z8673 Personal history of transient ischemic attack (TIA), and cerebral infarction without residual deficits: Secondary | ICD-10-CM | POA: Diagnosis not present

## 2020-12-28 DIAGNOSIS — J309 Allergic rhinitis, unspecified: Secondary | ICD-10-CM | POA: Diagnosis not present

## 2020-12-29 DIAGNOSIS — J309 Allergic rhinitis, unspecified: Secondary | ICD-10-CM | POA: Diagnosis not present

## 2020-12-29 DIAGNOSIS — I872 Venous insufficiency (chronic) (peripheral): Secondary | ICD-10-CM | POA: Diagnosis not present

## 2020-12-29 DIAGNOSIS — G9341 Metabolic encephalopathy: Secondary | ICD-10-CM | POA: Diagnosis not present

## 2020-12-29 DIAGNOSIS — I5030 Unspecified diastolic (congestive) heart failure: Secondary | ICD-10-CM | POA: Diagnosis not present

## 2020-12-29 DIAGNOSIS — Z8673 Personal history of transient ischemic attack (TIA), and cerebral infarction without residual deficits: Secondary | ICD-10-CM | POA: Diagnosis not present

## 2020-12-29 DIAGNOSIS — I959 Hypotension, unspecified: Secondary | ICD-10-CM | POA: Diagnosis not present

## 2021-01-01 DIAGNOSIS — F419 Anxiety disorder, unspecified: Secondary | ICD-10-CM | POA: Diagnosis not present

## 2021-01-02 DIAGNOSIS — R1312 Dysphagia, oropharyngeal phase: Secondary | ICD-10-CM | POA: Diagnosis not present

## 2021-01-02 DIAGNOSIS — Z7984 Long term (current) use of oral hypoglycemic drugs: Secondary | ICD-10-CM | POA: Diagnosis not present

## 2021-01-02 DIAGNOSIS — Z9981 Dependence on supplemental oxygen: Secondary | ICD-10-CM | POA: Diagnosis not present

## 2021-01-02 DIAGNOSIS — J309 Allergic rhinitis, unspecified: Secondary | ICD-10-CM | POA: Diagnosis not present

## 2021-01-02 DIAGNOSIS — I480 Paroxysmal atrial fibrillation: Secondary | ICD-10-CM | POA: Diagnosis not present

## 2021-01-02 DIAGNOSIS — E876 Hypokalemia: Secondary | ICD-10-CM | POA: Diagnosis not present

## 2021-01-02 DIAGNOSIS — J9611 Chronic respiratory failure with hypoxia: Secondary | ICD-10-CM | POA: Diagnosis not present

## 2021-01-02 DIAGNOSIS — I5030 Unspecified diastolic (congestive) heart failure: Secondary | ICD-10-CM | POA: Diagnosis not present

## 2021-01-02 DIAGNOSIS — K219 Gastro-esophageal reflux disease without esophagitis: Secondary | ICD-10-CM | POA: Diagnosis not present

## 2021-01-02 DIAGNOSIS — E1140 Type 2 diabetes mellitus with diabetic neuropathy, unspecified: Secondary | ICD-10-CM | POA: Diagnosis not present

## 2021-01-02 DIAGNOSIS — E1122 Type 2 diabetes mellitus with diabetic chronic kidney disease: Secondary | ICD-10-CM | POA: Diagnosis not present

## 2021-01-02 DIAGNOSIS — M4800 Spinal stenosis, site unspecified: Secondary | ICD-10-CM | POA: Diagnosis not present

## 2021-01-02 DIAGNOSIS — E7800 Pure hypercholesterolemia, unspecified: Secondary | ICD-10-CM | POA: Diagnosis not present

## 2021-01-02 DIAGNOSIS — I7025 Atherosclerosis of native arteries of other extremities with ulceration: Secondary | ICD-10-CM | POA: Diagnosis not present

## 2021-01-02 DIAGNOSIS — Z951 Presence of aortocoronary bypass graft: Secondary | ICD-10-CM | POA: Diagnosis not present

## 2021-01-02 DIAGNOSIS — Z8673 Personal history of transient ischemic attack (TIA), and cerebral infarction without residual deficits: Secondary | ICD-10-CM | POA: Diagnosis not present

## 2021-01-02 DIAGNOSIS — N390 Urinary tract infection, site not specified: Secondary | ICD-10-CM | POA: Diagnosis not present

## 2021-01-02 DIAGNOSIS — G9341 Metabolic encephalopathy: Secondary | ICD-10-CM | POA: Diagnosis not present

## 2021-01-02 DIAGNOSIS — N183 Chronic kidney disease, stage 3 unspecified: Secondary | ICD-10-CM | POA: Diagnosis not present

## 2021-01-02 DIAGNOSIS — Z7901 Long term (current) use of anticoagulants: Secondary | ICD-10-CM | POA: Diagnosis not present

## 2021-01-02 DIAGNOSIS — E1151 Type 2 diabetes mellitus with diabetic peripheral angiopathy without gangrene: Secondary | ICD-10-CM | POA: Diagnosis not present

## 2021-01-02 DIAGNOSIS — I872 Venous insufficiency (chronic) (peripheral): Secondary | ICD-10-CM | POA: Diagnosis not present

## 2021-01-02 DIAGNOSIS — I130 Hypertensive heart and chronic kidney disease with heart failure and stage 1 through stage 4 chronic kidney disease, or unspecified chronic kidney disease: Secondary | ICD-10-CM | POA: Diagnosis not present

## 2021-01-02 DIAGNOSIS — R011 Cardiac murmur, unspecified: Secondary | ICD-10-CM | POA: Diagnosis not present

## 2021-01-02 DIAGNOSIS — I959 Hypotension, unspecified: Secondary | ICD-10-CM | POA: Diagnosis not present

## 2021-01-04 ENCOUNTER — Telehealth: Payer: Self-pay | Admitting: Licensed Clinical Social Worker

## 2021-01-04 DIAGNOSIS — Z9981 Dependence on supplemental oxygen: Secondary | ICD-10-CM | POA: Diagnosis not present

## 2021-01-04 DIAGNOSIS — I5030 Unspecified diastolic (congestive) heart failure: Secondary | ICD-10-CM | POA: Diagnosis not present

## 2021-01-04 DIAGNOSIS — K219 Gastro-esophageal reflux disease without esophagitis: Secondary | ICD-10-CM | POA: Diagnosis not present

## 2021-01-04 DIAGNOSIS — Z7984 Long term (current) use of oral hypoglycemic drugs: Secondary | ICD-10-CM | POA: Diagnosis not present

## 2021-01-04 DIAGNOSIS — G9341 Metabolic encephalopathy: Secondary | ICD-10-CM | POA: Diagnosis not present

## 2021-01-04 DIAGNOSIS — E1140 Type 2 diabetes mellitus with diabetic neuropathy, unspecified: Secondary | ICD-10-CM | POA: Diagnosis not present

## 2021-01-04 NOTE — Telephone Encounter (Signed)
LCSW had scheduled reminder to check on transportation needs for pt. After chart review I noted that pt is still at [facility] per the last few notes. I called and confirmed w/ Magda Paganini in admissions that pt will be transported to her appt on 10/10 via [facility]. I have made a note of this on appt and remain available if alternate accommodations are needed.   Westley Hummer, MSW, [place]  [phone]

## 2021-01-05 DIAGNOSIS — E119 Type 2 diabetes mellitus without complications: Secondary | ICD-10-CM | POA: Diagnosis not present

## 2021-01-05 DIAGNOSIS — Z9981 Dependence on supplemental oxygen: Secondary | ICD-10-CM | POA: Diagnosis not present

## 2021-01-05 DIAGNOSIS — G9341 Metabolic encephalopathy: Secondary | ICD-10-CM | POA: Diagnosis not present

## 2021-01-05 DIAGNOSIS — K219 Gastro-esophageal reflux disease without esophagitis: Secondary | ICD-10-CM | POA: Diagnosis not present

## 2021-01-05 DIAGNOSIS — I5030 Unspecified diastolic (congestive) heart failure: Secondary | ICD-10-CM | POA: Diagnosis not present

## 2021-01-05 DIAGNOSIS — E1140 Type 2 diabetes mellitus with diabetic neuropathy, unspecified: Secondary | ICD-10-CM | POA: Diagnosis not present

## 2021-01-05 DIAGNOSIS — Z7984 Long term (current) use of oral hypoglycemic drugs: Secondary | ICD-10-CM | POA: Diagnosis not present

## 2021-01-06 ENCOUNTER — Telehealth: Payer: Self-pay

## 2021-01-06 DIAGNOSIS — Z9981 Dependence on supplemental oxygen: Secondary | ICD-10-CM | POA: Diagnosis not present

## 2021-01-06 DIAGNOSIS — G9341 Metabolic encephalopathy: Secondary | ICD-10-CM | POA: Diagnosis not present

## 2021-01-06 DIAGNOSIS — E1140 Type 2 diabetes mellitus with diabetic neuropathy, unspecified: Secondary | ICD-10-CM | POA: Diagnosis not present

## 2021-01-06 DIAGNOSIS — K219 Gastro-esophageal reflux disease without esophagitis: Secondary | ICD-10-CM | POA: Diagnosis not present

## 2021-01-06 DIAGNOSIS — Z7984 Long term (current) use of oral hypoglycemic drugs: Secondary | ICD-10-CM | POA: Diagnosis not present

## 2021-01-06 DIAGNOSIS — I5030 Unspecified diastolic (congestive) heart failure: Secondary | ICD-10-CM | POA: Diagnosis not present

## 2021-01-06 NOTE — Progress Notes (Signed)
Chronic Care Management Pharmacy Assistant   Name: [name]  MRN: [id number] DOB: 1927/09/30  Reason for Encounter: General Adherence   Recent office visits:  None since last CCM contact  Recent consult visits:  None since last CCM contact  Hospital visits:  None since last CCM contact  Medications: Outpatient Encounter Medications as of 01/06/2021  Medication Sig   acetaminophen (TYLENOL) 325 MG tablet Take 2 tablets (650 mg total) by mouth every 6 (six) hours.   ALPRAZolam (XANAX) 0.25 MG tablet Take 1 tablet (0.25 mg total) by mouth 2 (two) times daily as needed for anxiety.   alum & mag hydroxide-simeth (MAALOX PLUS) 400-400-40 MG/5ML suspension Take 30 mLs by mouth every 4 (four) hours as needed for indigestion.   apixaban (ELIQUIS) 2.5 MG TABS tablet Take 1 tablet (2.5 mg total) by mouth 2 (two) times daily.   B Complex-C (B-COMPLEX WITH VITAMIN C) tablet Take 1 tablet by mouth every morning.   bisacodyl (DULCOLAX) 10 MG suppository Place 10 mg rectally daily as needed (constipation not relieved by MOM).   diclofenac sodium (VOLTAREN) 1 % GEL Apply 1 application topically 3 (three) times daily.   Ensure (ENSURE) Take 237 mLs by mouth every morning.   famotidine (PEPCID) 20 MG tablet Take 1 tablet (20 mg total) by mouth daily as needed for heartburn (breakthrough heartburn).   fluticasone (FLONASE) 50 MCG/ACT nasal spray Place 1 spray into both nostrils daily as needed for allergies.   furosemide (LASIX) 20 MG tablet 20 mg daily and an additional 20 mg daily as needed for fluid buildup, shortness of breath   GLUCOSAMINE-CHONDROITIN PO Take 1 tablet by mouth every morning.   Lancets (ONETOUCH DELICA PLUS ERXVQM08Q) MISC Use to test blood sugar once daily dx code E11.9   melatonin 3 MG TABS tablet Take 1 tablet (3 mg total) by mouth at bedtime.   metoprolol tartrate 75 MG TABS Take 75 mg by mouth 2 (two) times daily.   midodrine (PROAMATINE) 5 MG tablet Take 1 tablet (5  mg total) by mouth 3 (three) times daily with meals.   Multiple Vitamins-Minerals (ICAPS) CAPS Take 1 capsule by mouth every morning.   mupirocin ointment (BACTROBAN) 2 % Apply 1 application topically daily. Apply to affected area at right lower leg.   nitroGLYCERIN (NITROSTAT) 0.4 MG SL tablet Place 1 tablet (0.4 mg total) under the tongue every 5 (five) minutes as needed for chest pain.   ondansetron (ZOFRAN) 4 MG tablet Take 1 tablet (4 mg total) by mouth every 6 (six) hours as needed for nausea.   ONETOUCH VERIO test strip USE TO TEST BLOOD SUGAR ONCE DAILY. DX:E11.9   OXYGEN Inhale 2 L into the lungs continuous.   pantoprazole (PROTONIX) 40 MG tablet TAKE 1 TABLET BY MOUTH TWICE A DAY   polyethylene glycol powder (GLYCOLAX/MIRALAX) 17 GM/SCOOP powder Dissolve 1 capful (17 g) in water and drink daily.   potassium chloride (MICRO-K) 10 MEQ CR capsule TAKE ONE CAPSULE BY MOUTH ONCE DAILY   pravastatin (PRAVACHOL) 20 MG tablet Take 1 tablet (20 mg total) by mouth daily.   Probiotic Product (ALIGN) 4 MG CAPS Take 1 capsule (4 mg total) by mouth daily.   senna-docusate (SENOKOT-S) 8.6-50 MG tablet Take 1 tablet by mouth at bedtime.   sertraline (ZOLOFT) 100 MG tablet Take 1 tablet (100 mg total) by mouth daily.   sitaGLIPtin (JANUVIA) 50 MG tablet Take 50 mg by mouth every morning.   Sodium Phosphates (RA  SALINE ENEMA RE) Place 1 enema rectally daily as needed (constipation not relieved by Bisacodyl suppository).   No facility-administered encounter medications on file as of 01/06/2021.   Attempted contact with July[facility] C. [facility] 3 times on 10/05, 10/07 and 10/12. Unsuccessful outreach. Will attempt again next month.  Patient is not > 5 days past due for refill on the following medications per chart history:  Star Medications: Medication Name/mg Last Fill Days Supply Januvia 50mg                          01/06/2021 90 Pravastatin 20mg                     12/24/2020     90  Care Gaps: Annual  wellness visit in last year? No last 2019 Most Recent BP reading: 125/75 on 11/10/2020  If Diabetic: Most recent A1C reading: 7.3 on 08/28/2020 Last eye exam / retinopathy screening: 06/03/2019 Last diabetic foot exam: 09/2019  PCP appointment on 02/16/2021  [name], CPP notified  Marijean Niemann, [place] [phone]   Time Spent: 20 Minutes

## 2021-01-07 DIAGNOSIS — Z7984 Long term (current) use of oral hypoglycemic drugs: Secondary | ICD-10-CM | POA: Diagnosis not present

## 2021-01-07 DIAGNOSIS — K219 Gastro-esophageal reflux disease without esophagitis: Secondary | ICD-10-CM | POA: Diagnosis not present

## 2021-01-07 DIAGNOSIS — Z9981 Dependence on supplemental oxygen: Secondary | ICD-10-CM | POA: Diagnosis not present

## 2021-01-07 DIAGNOSIS — G9341 Metabolic encephalopathy: Secondary | ICD-10-CM | POA: Diagnosis not present

## 2021-01-07 DIAGNOSIS — I5030 Unspecified diastolic (congestive) heart failure: Secondary | ICD-10-CM | POA: Diagnosis not present

## 2021-01-07 DIAGNOSIS — E1140 Type 2 diabetes mellitus with diabetic neuropathy, unspecified: Secondary | ICD-10-CM | POA: Diagnosis not present

## 2021-01-08 DIAGNOSIS — Z9981 Dependence on supplemental oxygen: Secondary | ICD-10-CM | POA: Diagnosis not present

## 2021-01-08 DIAGNOSIS — G9341 Metabolic encephalopathy: Secondary | ICD-10-CM | POA: Diagnosis not present

## 2021-01-08 DIAGNOSIS — E1140 Type 2 diabetes mellitus with diabetic neuropathy, unspecified: Secondary | ICD-10-CM | POA: Diagnosis not present

## 2021-01-08 DIAGNOSIS — I5030 Unspecified diastolic (congestive) heart failure: Secondary | ICD-10-CM | POA: Diagnosis not present

## 2021-01-08 DIAGNOSIS — K219 Gastro-esophageal reflux disease without esophagitis: Secondary | ICD-10-CM | POA: Diagnosis not present

## 2021-01-08 DIAGNOSIS — Z7984 Long term (current) use of oral hypoglycemic drugs: Secondary | ICD-10-CM | POA: Diagnosis not present

## 2021-01-11 ENCOUNTER — Ambulatory Visit: Payer: Medicare Other | Admitting: Cardiovascular Disease

## 2021-01-11 DIAGNOSIS — Z23 Encounter for immunization: Secondary | ICD-10-CM | POA: Diagnosis not present

## 2021-01-11 DIAGNOSIS — Z9981 Dependence on supplemental oxygen: Secondary | ICD-10-CM | POA: Diagnosis not present

## 2021-01-11 DIAGNOSIS — K219 Gastro-esophageal reflux disease without esophagitis: Secondary | ICD-10-CM | POA: Diagnosis not present

## 2021-01-11 DIAGNOSIS — G9341 Metabolic encephalopathy: Secondary | ICD-10-CM | POA: Diagnosis not present

## 2021-01-11 DIAGNOSIS — E1140 Type 2 diabetes mellitus with diabetic neuropathy, unspecified: Secondary | ICD-10-CM | POA: Diagnosis not present

## 2021-01-11 DIAGNOSIS — I5030 Unspecified diastolic (congestive) heart failure: Secondary | ICD-10-CM | POA: Diagnosis not present

## 2021-01-11 DIAGNOSIS — Z7984 Long term (current) use of oral hypoglycemic drugs: Secondary | ICD-10-CM | POA: Diagnosis not present

## 2021-01-12 DIAGNOSIS — Z7984 Long term (current) use of oral hypoglycemic drugs: Secondary | ICD-10-CM | POA: Diagnosis not present

## 2021-01-12 DIAGNOSIS — K219 Gastro-esophageal reflux disease without esophagitis: Secondary | ICD-10-CM | POA: Diagnosis not present

## 2021-01-12 DIAGNOSIS — I5030 Unspecified diastolic (congestive) heart failure: Secondary | ICD-10-CM | POA: Diagnosis not present

## 2021-01-12 DIAGNOSIS — G9341 Metabolic encephalopathy: Secondary | ICD-10-CM | POA: Diagnosis not present

## 2021-01-12 DIAGNOSIS — Z9981 Dependence on supplemental oxygen: Secondary | ICD-10-CM | POA: Diagnosis not present

## 2021-01-12 DIAGNOSIS — E1140 Type 2 diabetes mellitus with diabetic neuropathy, unspecified: Secondary | ICD-10-CM | POA: Diagnosis not present

## 2021-01-13 DIAGNOSIS — K219 Gastro-esophageal reflux disease without esophagitis: Secondary | ICD-10-CM | POA: Diagnosis not present

## 2021-01-13 DIAGNOSIS — Z9981 Dependence on supplemental oxygen: Secondary | ICD-10-CM | POA: Diagnosis not present

## 2021-01-13 DIAGNOSIS — I5030 Unspecified diastolic (congestive) heart failure: Secondary | ICD-10-CM | POA: Diagnosis not present

## 2021-01-13 DIAGNOSIS — E1140 Type 2 diabetes mellitus with diabetic neuropathy, unspecified: Secondary | ICD-10-CM | POA: Diagnosis not present

## 2021-01-13 DIAGNOSIS — Z7984 Long term (current) use of oral hypoglycemic drugs: Secondary | ICD-10-CM | POA: Diagnosis not present

## 2021-01-13 DIAGNOSIS — G9341 Metabolic encephalopathy: Secondary | ICD-10-CM | POA: Diagnosis not present

## 2021-01-14 DIAGNOSIS — G9341 Metabolic encephalopathy: Secondary | ICD-10-CM | POA: Diagnosis not present

## 2021-01-14 DIAGNOSIS — I5030 Unspecified diastolic (congestive) heart failure: Secondary | ICD-10-CM | POA: Diagnosis not present

## 2021-01-14 DIAGNOSIS — E1140 Type 2 diabetes mellitus with diabetic neuropathy, unspecified: Secondary | ICD-10-CM | POA: Diagnosis not present

## 2021-01-14 DIAGNOSIS — K219 Gastro-esophageal reflux disease without esophagitis: Secondary | ICD-10-CM | POA: Diagnosis not present

## 2021-01-14 DIAGNOSIS — Z7984 Long term (current) use of oral hypoglycemic drugs: Secondary | ICD-10-CM | POA: Diagnosis not present

## 2021-01-14 DIAGNOSIS — Z9981 Dependence on supplemental oxygen: Secondary | ICD-10-CM | POA: Diagnosis not present

## 2021-01-15 DIAGNOSIS — E1142 Type 2 diabetes mellitus with diabetic polyneuropathy: Secondary | ICD-10-CM | POA: Diagnosis not present

## 2021-01-15 DIAGNOSIS — K219 Gastro-esophageal reflux disease without esophagitis: Secondary | ICD-10-CM | POA: Diagnosis not present

## 2021-01-15 DIAGNOSIS — E1151 Type 2 diabetes mellitus with diabetic peripheral angiopathy without gangrene: Secondary | ICD-10-CM | POA: Diagnosis not present

## 2021-01-15 DIAGNOSIS — I5030 Unspecified diastolic (congestive) heart failure: Secondary | ICD-10-CM | POA: Diagnosis not present

## 2021-01-15 DIAGNOSIS — Z9981 Dependence on supplemental oxygen: Secondary | ICD-10-CM | POA: Diagnosis not present

## 2021-01-15 DIAGNOSIS — Z7984 Long term (current) use of oral hypoglycemic drugs: Secondary | ICD-10-CM | POA: Diagnosis not present

## 2021-01-15 DIAGNOSIS — J9611 Chronic respiratory failure with hypoxia: Secondary | ICD-10-CM | POA: Diagnosis not present

## 2021-01-15 DIAGNOSIS — E1140 Type 2 diabetes mellitus with diabetic neuropathy, unspecified: Secondary | ICD-10-CM | POA: Diagnosis not present

## 2021-01-15 DIAGNOSIS — G9341 Metabolic encephalopathy: Secondary | ICD-10-CM | POA: Diagnosis not present

## 2021-01-15 DIAGNOSIS — I482 Chronic atrial fibrillation, unspecified: Secondary | ICD-10-CM | POA: Diagnosis not present

## 2021-01-18 DIAGNOSIS — E1140 Type 2 diabetes mellitus with diabetic neuropathy, unspecified: Secondary | ICD-10-CM | POA: Diagnosis not present

## 2021-01-18 DIAGNOSIS — G9341 Metabolic encephalopathy: Secondary | ICD-10-CM | POA: Diagnosis not present

## 2021-01-18 DIAGNOSIS — Z7984 Long term (current) use of oral hypoglycemic drugs: Secondary | ICD-10-CM | POA: Diagnosis not present

## 2021-01-18 DIAGNOSIS — I5030 Unspecified diastolic (congestive) heart failure: Secondary | ICD-10-CM | POA: Diagnosis not present

## 2021-01-18 DIAGNOSIS — Z9981 Dependence on supplemental oxygen: Secondary | ICD-10-CM | POA: Diagnosis not present

## 2021-01-18 DIAGNOSIS — K219 Gastro-esophageal reflux disease without esophagitis: Secondary | ICD-10-CM | POA: Diagnosis not present

## 2021-01-19 DIAGNOSIS — Z9981 Dependence on supplemental oxygen: Secondary | ICD-10-CM | POA: Diagnosis not present

## 2021-01-19 DIAGNOSIS — B351 Tinea unguium: Secondary | ICD-10-CM | POA: Diagnosis not present

## 2021-01-19 DIAGNOSIS — I5030 Unspecified diastolic (congestive) heart failure: Secondary | ICD-10-CM | POA: Diagnosis not present

## 2021-01-19 DIAGNOSIS — E1140 Type 2 diabetes mellitus with diabetic neuropathy, unspecified: Secondary | ICD-10-CM | POA: Diagnosis not present

## 2021-01-19 DIAGNOSIS — G9341 Metabolic encephalopathy: Secondary | ICD-10-CM | POA: Diagnosis not present

## 2021-01-19 DIAGNOSIS — I739 Peripheral vascular disease, unspecified: Secondary | ICD-10-CM | POA: Diagnosis not present

## 2021-01-19 DIAGNOSIS — L603 Nail dystrophy: Secondary | ICD-10-CM | POA: Diagnosis not present

## 2021-01-19 DIAGNOSIS — K219 Gastro-esophageal reflux disease without esophagitis: Secondary | ICD-10-CM | POA: Diagnosis not present

## 2021-01-19 DIAGNOSIS — Z7984 Long term (current) use of oral hypoglycemic drugs: Secondary | ICD-10-CM | POA: Diagnosis not present

## 2021-01-20 DIAGNOSIS — K219 Gastro-esophageal reflux disease without esophagitis: Secondary | ICD-10-CM | POA: Diagnosis not present

## 2021-01-20 DIAGNOSIS — Z9981 Dependence on supplemental oxygen: Secondary | ICD-10-CM | POA: Diagnosis not present

## 2021-01-20 DIAGNOSIS — G9341 Metabolic encephalopathy: Secondary | ICD-10-CM | POA: Diagnosis not present

## 2021-01-20 DIAGNOSIS — Z7984 Long term (current) use of oral hypoglycemic drugs: Secondary | ICD-10-CM | POA: Diagnosis not present

## 2021-01-20 DIAGNOSIS — I5030 Unspecified diastolic (congestive) heart failure: Secondary | ICD-10-CM | POA: Diagnosis not present

## 2021-01-20 DIAGNOSIS — E1140 Type 2 diabetes mellitus with diabetic neuropathy, unspecified: Secondary | ICD-10-CM | POA: Diagnosis not present

## 2021-01-21 DIAGNOSIS — I5030 Unspecified diastolic (congestive) heart failure: Secondary | ICD-10-CM | POA: Diagnosis not present

## 2021-01-21 DIAGNOSIS — Z9981 Dependence on supplemental oxygen: Secondary | ICD-10-CM | POA: Diagnosis not present

## 2021-01-21 DIAGNOSIS — E1140 Type 2 diabetes mellitus with diabetic neuropathy, unspecified: Secondary | ICD-10-CM | POA: Diagnosis not present

## 2021-01-21 DIAGNOSIS — G9341 Metabolic encephalopathy: Secondary | ICD-10-CM | POA: Diagnosis not present

## 2021-01-21 DIAGNOSIS — Z7984 Long term (current) use of oral hypoglycemic drugs: Secondary | ICD-10-CM | POA: Diagnosis not present

## 2021-01-21 DIAGNOSIS — K219 Gastro-esophageal reflux disease without esophagitis: Secondary | ICD-10-CM | POA: Diagnosis not present

## 2021-01-22 DIAGNOSIS — Z7984 Long term (current) use of oral hypoglycemic drugs: Secondary | ICD-10-CM | POA: Diagnosis not present

## 2021-01-22 DIAGNOSIS — K219 Gastro-esophageal reflux disease without esophagitis: Secondary | ICD-10-CM | POA: Diagnosis not present

## 2021-01-22 DIAGNOSIS — G9341 Metabolic encephalopathy: Secondary | ICD-10-CM | POA: Diagnosis not present

## 2021-01-22 DIAGNOSIS — I5030 Unspecified diastolic (congestive) heart failure: Secondary | ICD-10-CM | POA: Diagnosis not present

## 2021-01-22 DIAGNOSIS — E1140 Type 2 diabetes mellitus with diabetic neuropathy, unspecified: Secondary | ICD-10-CM | POA: Diagnosis not present

## 2021-01-22 DIAGNOSIS — Z9981 Dependence on supplemental oxygen: Secondary | ICD-10-CM | POA: Diagnosis not present

## 2021-01-25 DIAGNOSIS — G9341 Metabolic encephalopathy: Secondary | ICD-10-CM | POA: Diagnosis not present

## 2021-01-25 DIAGNOSIS — E1140 Type 2 diabetes mellitus with diabetic neuropathy, unspecified: Secondary | ICD-10-CM | POA: Diagnosis not present

## 2021-01-25 DIAGNOSIS — Z9981 Dependence on supplemental oxygen: Secondary | ICD-10-CM | POA: Diagnosis not present

## 2021-01-25 DIAGNOSIS — I5030 Unspecified diastolic (congestive) heart failure: Secondary | ICD-10-CM | POA: Diagnosis not present

## 2021-01-25 DIAGNOSIS — K219 Gastro-esophageal reflux disease without esophagitis: Secondary | ICD-10-CM | POA: Diagnosis not present

## 2021-01-25 DIAGNOSIS — Z7984 Long term (current) use of oral hypoglycemic drugs: Secondary | ICD-10-CM | POA: Diagnosis not present

## 2021-01-26 DIAGNOSIS — Z9981 Dependence on supplemental oxygen: Secondary | ICD-10-CM | POA: Diagnosis not present

## 2021-01-26 DIAGNOSIS — G9341 Metabolic encephalopathy: Secondary | ICD-10-CM | POA: Diagnosis not present

## 2021-01-26 DIAGNOSIS — E1140 Type 2 diabetes mellitus with diabetic neuropathy, unspecified: Secondary | ICD-10-CM | POA: Diagnosis not present

## 2021-01-26 DIAGNOSIS — K219 Gastro-esophageal reflux disease without esophagitis: Secondary | ICD-10-CM | POA: Diagnosis not present

## 2021-01-26 DIAGNOSIS — Z7984 Long term (current) use of oral hypoglycemic drugs: Secondary | ICD-10-CM | POA: Diagnosis not present

## 2021-01-26 DIAGNOSIS — I5030 Unspecified diastolic (congestive) heart failure: Secondary | ICD-10-CM | POA: Diagnosis not present

## 2021-01-27 DIAGNOSIS — G9341 Metabolic encephalopathy: Secondary | ICD-10-CM | POA: Diagnosis not present

## 2021-01-27 DIAGNOSIS — I5030 Unspecified diastolic (congestive) heart failure: Secondary | ICD-10-CM | POA: Diagnosis not present

## 2021-01-27 DIAGNOSIS — K219 Gastro-esophageal reflux disease without esophagitis: Secondary | ICD-10-CM | POA: Diagnosis not present

## 2021-01-27 DIAGNOSIS — Z7984 Long term (current) use of oral hypoglycemic drugs: Secondary | ICD-10-CM | POA: Diagnosis not present

## 2021-01-27 DIAGNOSIS — Z9981 Dependence on supplemental oxygen: Secondary | ICD-10-CM | POA: Diagnosis not present

## 2021-01-27 DIAGNOSIS — E1140 Type 2 diabetes mellitus with diabetic neuropathy, unspecified: Secondary | ICD-10-CM | POA: Diagnosis not present

## 2021-01-28 DIAGNOSIS — E1140 Type 2 diabetes mellitus with diabetic neuropathy, unspecified: Secondary | ICD-10-CM | POA: Diagnosis not present

## 2021-01-28 DIAGNOSIS — G9341 Metabolic encephalopathy: Secondary | ICD-10-CM | POA: Diagnosis not present

## 2021-01-28 DIAGNOSIS — I5030 Unspecified diastolic (congestive) heart failure: Secondary | ICD-10-CM | POA: Diagnosis not present

## 2021-01-28 DIAGNOSIS — Z9981 Dependence on supplemental oxygen: Secondary | ICD-10-CM | POA: Diagnosis not present

## 2021-01-28 DIAGNOSIS — K219 Gastro-esophageal reflux disease without esophagitis: Secondary | ICD-10-CM | POA: Diagnosis not present

## 2021-01-28 DIAGNOSIS — Z7984 Long term (current) use of oral hypoglycemic drugs: Secondary | ICD-10-CM | POA: Diagnosis not present

## 2021-01-29 DIAGNOSIS — E1140 Type 2 diabetes mellitus with diabetic neuropathy, unspecified: Secondary | ICD-10-CM | POA: Diagnosis not present

## 2021-01-29 DIAGNOSIS — F419 Anxiety disorder, unspecified: Secondary | ICD-10-CM | POA: Diagnosis not present

## 2021-01-29 DIAGNOSIS — Z7984 Long term (current) use of oral hypoglycemic drugs: Secondary | ICD-10-CM | POA: Diagnosis not present

## 2021-01-29 DIAGNOSIS — G9341 Metabolic encephalopathy: Secondary | ICD-10-CM | POA: Diagnosis not present

## 2021-01-29 DIAGNOSIS — Z9981 Dependence on supplemental oxygen: Secondary | ICD-10-CM | POA: Diagnosis not present

## 2021-01-29 DIAGNOSIS — K219 Gastro-esophageal reflux disease without esophagitis: Secondary | ICD-10-CM | POA: Diagnosis not present

## 2021-01-29 DIAGNOSIS — I5030 Unspecified diastolic (congestive) heart failure: Secondary | ICD-10-CM | POA: Diagnosis not present

## 2021-02-01 DIAGNOSIS — K219 Gastro-esophageal reflux disease without esophagitis: Secondary | ICD-10-CM | POA: Diagnosis not present

## 2021-02-01 DIAGNOSIS — Z9981 Dependence on supplemental oxygen: Secondary | ICD-10-CM | POA: Diagnosis not present

## 2021-02-01 DIAGNOSIS — I5030 Unspecified diastolic (congestive) heart failure: Secondary | ICD-10-CM | POA: Diagnosis not present

## 2021-02-01 DIAGNOSIS — Z7984 Long term (current) use of oral hypoglycemic drugs: Secondary | ICD-10-CM | POA: Diagnosis not present

## 2021-02-01 DIAGNOSIS — E1140 Type 2 diabetes mellitus with diabetic neuropathy, unspecified: Secondary | ICD-10-CM | POA: Diagnosis not present

## 2021-02-01 DIAGNOSIS — G9341 Metabolic encephalopathy: Secondary | ICD-10-CM | POA: Diagnosis not present

## 2021-02-02 ENCOUNTER — Ambulatory Visit (INDEPENDENT_AMBULATORY_CARE_PROVIDER_SITE_OTHER): Admitting: Orthopaedic Surgery

## 2021-02-02 ENCOUNTER — Encounter: Payer: Self-pay | Admitting: Orthopaedic Surgery

## 2021-02-02 ENCOUNTER — Ambulatory Visit: Payer: Medicare Other | Admitting: Family Medicine

## 2021-02-02 ENCOUNTER — Other Ambulatory Visit: Payer: Self-pay

## 2021-02-02 ENCOUNTER — Ambulatory Visit: Payer: Self-pay

## 2021-02-02 DIAGNOSIS — E7800 Pure hypercholesterolemia, unspecified: Secondary | ICD-10-CM | POA: Diagnosis not present

## 2021-02-02 DIAGNOSIS — S72141A Displaced intertrochanteric fracture of right femur, initial encounter for closed fracture: Secondary | ICD-10-CM

## 2021-02-02 DIAGNOSIS — K219 Gastro-esophageal reflux disease without esophagitis: Secondary | ICD-10-CM | POA: Diagnosis not present

## 2021-02-02 DIAGNOSIS — I2510 Atherosclerotic heart disease of native coronary artery without angina pectoris: Secondary | ICD-10-CM

## 2021-02-02 DIAGNOSIS — E1122 Type 2 diabetes mellitus with diabetic chronic kidney disease: Secondary | ICD-10-CM | POA: Diagnosis not present

## 2021-02-02 DIAGNOSIS — I5030 Unspecified diastolic (congestive) heart failure: Secondary | ICD-10-CM | POA: Diagnosis not present

## 2021-02-02 DIAGNOSIS — R011 Cardiac murmur, unspecified: Secondary | ICD-10-CM | POA: Diagnosis not present

## 2021-02-02 DIAGNOSIS — N183 Chronic kidney disease, stage 3 unspecified: Secondary | ICD-10-CM | POA: Diagnosis not present

## 2021-02-02 DIAGNOSIS — J9611 Chronic respiratory failure with hypoxia: Secondary | ICD-10-CM | POA: Diagnosis not present

## 2021-02-02 DIAGNOSIS — E1151 Type 2 diabetes mellitus with diabetic peripheral angiopathy without gangrene: Secondary | ICD-10-CM | POA: Diagnosis not present

## 2021-02-02 DIAGNOSIS — Z7984 Long term (current) use of oral hypoglycemic drugs: Secondary | ICD-10-CM | POA: Diagnosis not present

## 2021-02-02 DIAGNOSIS — E876 Hypokalemia: Secondary | ICD-10-CM | POA: Diagnosis not present

## 2021-02-02 DIAGNOSIS — J309 Allergic rhinitis, unspecified: Secondary | ICD-10-CM | POA: Diagnosis not present

## 2021-02-02 DIAGNOSIS — Z7901 Long term (current) use of anticoagulants: Secondary | ICD-10-CM | POA: Diagnosis not present

## 2021-02-02 DIAGNOSIS — Z951 Presence of aortocoronary bypass graft: Secondary | ICD-10-CM | POA: Diagnosis not present

## 2021-02-02 DIAGNOSIS — Z9981 Dependence on supplemental oxygen: Secondary | ICD-10-CM | POA: Diagnosis not present

## 2021-02-02 DIAGNOSIS — Z8673 Personal history of transient ischemic attack (TIA), and cerebral infarction without residual deficits: Secondary | ICD-10-CM | POA: Diagnosis not present

## 2021-02-02 DIAGNOSIS — G9341 Metabolic encephalopathy: Secondary | ICD-10-CM | POA: Diagnosis not present

## 2021-02-02 DIAGNOSIS — I7025 Atherosclerosis of native arteries of other extremities with ulceration: Secondary | ICD-10-CM | POA: Diagnosis not present

## 2021-02-02 DIAGNOSIS — I480 Paroxysmal atrial fibrillation: Secondary | ICD-10-CM | POA: Diagnosis not present

## 2021-02-02 DIAGNOSIS — R1312 Dysphagia, oropharyngeal phase: Secondary | ICD-10-CM | POA: Diagnosis not present

## 2021-02-02 DIAGNOSIS — M4800 Spinal stenosis, site unspecified: Secondary | ICD-10-CM | POA: Diagnosis not present

## 2021-02-02 DIAGNOSIS — N390 Urinary tract infection, site not specified: Secondary | ICD-10-CM | POA: Diagnosis not present

## 2021-02-02 DIAGNOSIS — I872 Venous insufficiency (chronic) (peripheral): Secondary | ICD-10-CM | POA: Diagnosis not present

## 2021-02-02 DIAGNOSIS — I130 Hypertensive heart and chronic kidney disease with heart failure and stage 1 through stage 4 chronic kidney disease, or unspecified chronic kidney disease: Secondary | ICD-10-CM | POA: Diagnosis not present

## 2021-02-02 DIAGNOSIS — I959 Hypotension, unspecified: Secondary | ICD-10-CM | POA: Diagnosis not present

## 2021-02-02 DIAGNOSIS — E1140 Type 2 diabetes mellitus with diabetic neuropathy, unspecified: Secondary | ICD-10-CM | POA: Diagnosis not present

## 2021-02-02 NOTE — Progress Notes (Signed)
Post-Op Visit Note   Patient: [name]           Date of Birth: 09-01-1927           MRN: [id number] Visit Date: 02/02/2021 PCP: [name] Bush, MD   Assessment & Plan:  Chief Complaint:  Chief Complaint  Patient presents with   Right Hip - Pain   Visit Diagnoses:  1. Displaced intertrochanteric fracture of right femur, initial encounter for closed fracture [facility])     Plan: [name] is 5 months status post IM nail right intertrochanteric fracture.  Mainly ambulating in wheelchair due to being on chronic oxygen since July for congestive heart failure and COPD.  She has been placed on hospice.  She really does not have any complaints regarding the hip.  Right hip shows fully healed surgical scars.  She has good range of motion without pain.  X-rays demonstrate fully healed fracture without any complications.  Unfortunately she is limited by her medical conditions particularly CHF and COPD.  Her hip fracture has demonstrated a radiographic healing and clinical healing.  From my standpoint she can just follow-up as needed.  Follow-Up Instructions: No follow-ups on file.   Orders:  Orders Placed This Encounter  Procedures   XR FEMUR, MIN 2 VIEWS RIGHT   No orders of the defined types were placed in this encounter.   Imaging: XR FEMUR, MIN 2 VIEWS RIGHT  Result Date: 02/02/2021 Healed intertrochanteric fracture without any hardware complications.   PMFS History: Patient Active Problem List   Diagnosis Date Noted   Acute on chronic systolic CHF (congestive heart failure) ([place]) 11/10/2020   Chronic anticoagulation 11/10/2020   Hypomagnesemia 10/03/2020   Depression with anxiety 10/01/2020   DVT (deep venous thrombosis) ([place]) 10/01/2020   Acute on chronic diastolic CHF (congestive heart failure) ([place]) 10/01/2020   Atrial fibrillation, chronic ([place]) 09/22/2020   Cerebral atrophy ([place]) 09/22/2020   Atherosclerosis of aorta ([place]) 09/22/2020   Osteoporosis 09/22/2020    Bilirubinemia 09/22/2020   Leukocytosis 09/22/2020   Pyuria 09/22/2020   History of recurrent UTIs 09/22/2020   Hearing loss 09/22/2020   Chronic respiratory failure with hypoxia, on home O2 therapy ([place]) 09/22/2020   SIRS (systemic inflammatory response syndrome) ([place])    Altered mental status, unspecified 09/21/2020   Postoperative anemia due to acute blood loss 09/13/2020   Displaced intertrochanteric fracture of right femur, initial encounter for closed fracture ([place]) 09/05/2020   Hip fracture due to osteoporosis with routine healing 09/05/2020   Financial difficulty 02/26/2020   UTI (urinary tract infection) 10/01/2019   CKD stage 3 due to type 2 diabetes mellitus ([place]) 09/02/2019   History of CVA in adulthood 08/23/2019   Polypharmacy 07/27/2019   E coli bacteremia 02/13/2019   Dyspnea    Acute on chronic respiratory failure with hypoxia and hypercapnia (HCC)    Chronic leg pain 11/14/2018   Atherosclerosis of native arteries of the extremities with ulceration ([place]) 08/21/2018   Chronic constipation 06/18/2018   Advanced directives, counseling/discussion 01/29/2018   Bilateral hearing loss 01/29/2018   Spinal stenosis of lumbosacral region 78/24/2353   Systolic murmur 61/44/3154   De Quervain's tenosynovitis, left 01/09/2017   Encounter for chronic pain management 02/09/2016   Paroxysmal atrial fibrillation (HCC)    Acute on chronic respiratory failure with hypoxia ([place]) 09/27/2015   Varicose veins of left lower extremity with inflammation, with ulcer of calf limited to breakdown of skin ([place]) 05/22/2015   Varicose veins of right lower extremity with  inflammation 01/23/2015   Diabetes mellitus type 2 with peripheral artery disease (HCC)    Chronic venous insufficiency    Bleeding from varicose veins of lower extremity, left 02/13/2014   Diabetic polyneuropathy ([place]) 11/19/2013   Right shoulder pain 08/19/2013   Abdominal pain 05/14/2012   Unsteady gait 12/13/2011    Chronic lower back pain 09/14/2011   Hyperlipidemia associated with type 2 diabetes mellitus ([place]) 09/06/2010   GAD (generalized anxiety disorder) 03/15/2010   Obesity, Class I, BMI 30-34.9 03/15/2010   Hypertension associated with type 2 diabetes mellitus ([place]) 03/15/2010   (HFpEF) heart failure with preserved ejection fraction ([place]) 03/15/2010   Coronary artery disease    Past Medical History:  Diagnosis Date   Actinic keratosis 01/22/2020   right forearm   Acute deep vein thrombosis (DVT) of distal vein of right lower extremity ([place]) 12/2013   Acute thrombus of R proximal gastrocnemius vein. Symptomatic provoked distal DVT   Anxiety    Atherosclerosis of aorta ([place]) 09/22/2020   Basal cell carcinoma 10/30/2018   R nasal bridge, treated with Moh's   Basal cell carcinoma 01/22/2020   left forearm, nodular   Cellulitis of leg, left 01/16/2014   Cerebral atrophy ([place]) 09/22/2020   Head CT 09/21/20 finding   Cerebral infarct ([place])    Head CT 09/21/20: remote LEFT frontoparietal white matter infarct.   Cerumen impaction 04/16/2013   Saw Dr [name] 02/2018 s/p cleaning   CHF (congestive heart failure) ([place])    Chronic back pain 03/2012   lumbar DDD with herniation - s/p L S1 transforaminal ESI (10/2012 Dr. Sharlet [name])   Chronic respiratory failure with hypoxia, on home O2 therapy ([place]) 09/22/2020   Chronic venous insufficiency    Coronary artery disease    CABG 1998   Cystocele    Depression    Diabetes mellitus    type 2, followed by endo.   Diastolic dysfunction    Diverticulitis of duodenum    Diverticulosis    Duodenal ulcer perforation ([place]) 02/08/2019   Erythema of breast 04/18/2018   Hearing loss 09/22/2020   Hemorrhoids    Hip fracture ([place]) 09/05/2020   History of heart attack    History of recurrent UTIs 09/22/2020   Hx of echocardiogram    a. Echo 10/13: mild LVH, EF 55-60%, Gr 1 diast dysfn, PASP 32   Hyperlipidemia    Hypertension    Irritable bowel syndrome     Lethargy 07/27/2019   Microcytic anemia    MRSA cellulitis 03/31/2018   Osteoporosis 09/22/2020   PAD (peripheral artery disease) ([place]) 2013   ABI: R 0.91, L 0.83   Peptic ulcer disease    Pneumonia 02/15/2019   Postmenopausal osteoporosis 01/2019   T score of -2.6   PUD (peptic ulcer disease)    Sphenoid sinusitis 09/21/2020   Head CT 09/21/20: New RIGHT sphenoid sinus fluid/acute sinusitis.   Varicose veins    Venous ulcer of leg ([place]) 12/12/2013   Required prolonged treatment by wound center   Viral gastroenteritis due to [place] virus 05/13/2015   hospitalization    Family History  Problem Relation Age of Onset   Hypertension Mother    Stroke Mother    Diabetes Father    Diabetes Sister    Diabetes Brother    Heart disease Son    Colon cancer Neg Hx     Past Surgical History:  Procedure Laterality Date   ABDOMINAL HYSTERECTOMY  04/04/1973   TAH,BSO   ABI  04/05/2011  R 0.91, L 0.83   ANGIOPLASTY Right 09/06/2018   Lucky Cowboy, MD (Vasc): PCTA peroneal a; PCTL SFA; PTLA popliteal a; DES stent right SFR and popliteal a   APPENDECTOMY     per pt report   CARDIOVASCULAR STRESS TEST  04/04/2013   low risk myoview ([place])   CATARACT SURGERY     CHOLECYSTECTOMY     COLONOSCOPY  02/02/2010   extensive diverticulosis throughout, small internal hemorrhoids, rec rpt 5 yrs (Dr. Allyn Kenner)   [place] GRAFT  04/04/1996   dexa scan  09/03/2011   femur -0.8, forearm 0.6 => normal   ENDOVENOUS ABLATION SAPHENOUS VEIN W/ LASER Left 03/04/2014   Kellie Simmering   [facility]  04/04/2012   L S1 transforaminal ESI x2 ([place]) without improvement   FEMORAL ARTERY STENT Right 09/06/2018   Lucky Cowboy, MD (Vasc): PCTA peroneal a; PCTL SFA; PTLA popliteal a; DES stent right SFR and popliteal a   HAND SURGERY     INTRAMEDULLARY (IM) NAIL INTERTROCHANTERIC Right 09/07/2020   Procedure: INTRAMEDULLARY (IM) NAIL INTERTROCHANTRIC;  Surgeon: Leandrew Koyanagi, MD;  Location: [facility];  Service: Orthopedics;   Laterality: Right;   lexiscan myoview  03/05/2011   Small basal inferolateral and anterolateral reversible perfusion defect suggests ischemia.  EF was normal.   old infarct, no new ischemia   LOWER EXTREMITY ANGIOGRAPHY Right 09/06/2018   Procedure: LOWER EXTREMITY ANGIOGRAPHY;  Surgeon: Algernon Huxley, MD;  Location: [facility];  Service: Cardiovascular;  Laterality: Right;   POPLITEAL ARTERY STENT Right 09/06/2018   Lucky Cowboy, MD (Vasc): PCTA peroneal a; PCTL SFA; PTLA popliteal a; DES stent right SFR and popliteal a   toe surgery     TYMPANOPLASTY  1960s   R side   Vault prolapse A & P repair  04/05/2007   [facility]   Social History   Occupational History   Occupation: Waitress   Tobacco Use   Smoking status: Former    Types: Cigarettes    Quit date: 03/05/1979    Years since quitting: 41.9   Smokeless tobacco: Never  Vaping Use   Vaping Use: Never used  Substance and Sexual Activity   Alcohol use: No    Alcohol/week: 0.0 standard drinks   Drug use: No   Sexual activity: Not Currently    Comment: 1st intercourse 87yo.--- 2 sexual partners

## 2021-02-03 DIAGNOSIS — Z951 Presence of aortocoronary bypass graft: Secondary | ICD-10-CM | POA: Diagnosis not present

## 2021-02-03 DIAGNOSIS — I5030 Unspecified diastolic (congestive) heart failure: Secondary | ICD-10-CM | POA: Diagnosis not present

## 2021-02-03 DIAGNOSIS — K219 Gastro-esophageal reflux disease without esophagitis: Secondary | ICD-10-CM | POA: Diagnosis not present

## 2021-02-03 DIAGNOSIS — G9341 Metabolic encephalopathy: Secondary | ICD-10-CM | POA: Diagnosis not present

## 2021-02-03 DIAGNOSIS — E7800 Pure hypercholesterolemia, unspecified: Secondary | ICD-10-CM | POA: Diagnosis not present

## 2021-02-03 DIAGNOSIS — E1140 Type 2 diabetes mellitus with diabetic neuropathy, unspecified: Secondary | ICD-10-CM | POA: Diagnosis not present

## 2021-02-04 DIAGNOSIS — I5030 Unspecified diastolic (congestive) heart failure: Secondary | ICD-10-CM | POA: Diagnosis not present

## 2021-02-04 DIAGNOSIS — G9341 Metabolic encephalopathy: Secondary | ICD-10-CM | POA: Diagnosis not present

## 2021-02-04 DIAGNOSIS — E7800 Pure hypercholesterolemia, unspecified: Secondary | ICD-10-CM | POA: Diagnosis not present

## 2021-02-04 DIAGNOSIS — Z951 Presence of aortocoronary bypass graft: Secondary | ICD-10-CM | POA: Diagnosis not present

## 2021-02-04 DIAGNOSIS — E1140 Type 2 diabetes mellitus with diabetic neuropathy, unspecified: Secondary | ICD-10-CM | POA: Diagnosis not present

## 2021-02-04 DIAGNOSIS — K219 Gastro-esophageal reflux disease without esophagitis: Secondary | ICD-10-CM | POA: Diagnosis not present

## 2021-02-05 DIAGNOSIS — E1140 Type 2 diabetes mellitus with diabetic neuropathy, unspecified: Secondary | ICD-10-CM | POA: Diagnosis not present

## 2021-02-05 DIAGNOSIS — Z951 Presence of aortocoronary bypass graft: Secondary | ICD-10-CM | POA: Diagnosis not present

## 2021-02-05 DIAGNOSIS — K219 Gastro-esophageal reflux disease without esophagitis: Secondary | ICD-10-CM | POA: Diagnosis not present

## 2021-02-05 DIAGNOSIS — E7800 Pure hypercholesterolemia, unspecified: Secondary | ICD-10-CM | POA: Diagnosis not present

## 2021-02-05 DIAGNOSIS — G9341 Metabolic encephalopathy: Secondary | ICD-10-CM | POA: Diagnosis not present

## 2021-02-05 DIAGNOSIS — I5030 Unspecified diastolic (congestive) heart failure: Secondary | ICD-10-CM | POA: Diagnosis not present

## 2021-02-08 DIAGNOSIS — E1140 Type 2 diabetes mellitus with diabetic neuropathy, unspecified: Secondary | ICD-10-CM | POA: Diagnosis not present

## 2021-02-08 DIAGNOSIS — Z951 Presence of aortocoronary bypass graft: Secondary | ICD-10-CM | POA: Diagnosis not present

## 2021-02-08 DIAGNOSIS — G9341 Metabolic encephalopathy: Secondary | ICD-10-CM | POA: Diagnosis not present

## 2021-02-08 DIAGNOSIS — K219 Gastro-esophageal reflux disease without esophagitis: Secondary | ICD-10-CM | POA: Diagnosis not present

## 2021-02-08 DIAGNOSIS — E7800 Pure hypercholesterolemia, unspecified: Secondary | ICD-10-CM | POA: Diagnosis not present

## 2021-02-08 DIAGNOSIS — I5030 Unspecified diastolic (congestive) heart failure: Secondary | ICD-10-CM | POA: Diagnosis not present

## 2021-02-09 DIAGNOSIS — E1140 Type 2 diabetes mellitus with diabetic neuropathy, unspecified: Secondary | ICD-10-CM | POA: Diagnosis not present

## 2021-02-09 DIAGNOSIS — Z951 Presence of aortocoronary bypass graft: Secondary | ICD-10-CM | POA: Diagnosis not present

## 2021-02-09 DIAGNOSIS — G9341 Metabolic encephalopathy: Secondary | ICD-10-CM | POA: Diagnosis not present

## 2021-02-09 DIAGNOSIS — E7800 Pure hypercholesterolemia, unspecified: Secondary | ICD-10-CM | POA: Diagnosis not present

## 2021-02-09 DIAGNOSIS — K219 Gastro-esophageal reflux disease without esophagitis: Secondary | ICD-10-CM | POA: Diagnosis not present

## 2021-02-09 DIAGNOSIS — I5030 Unspecified diastolic (congestive) heart failure: Secondary | ICD-10-CM | POA: Diagnosis not present

## 2021-02-10 DIAGNOSIS — Z951 Presence of aortocoronary bypass graft: Secondary | ICD-10-CM | POA: Diagnosis not present

## 2021-02-10 DIAGNOSIS — E1140 Type 2 diabetes mellitus with diabetic neuropathy, unspecified: Secondary | ICD-10-CM | POA: Diagnosis not present

## 2021-02-10 DIAGNOSIS — I5030 Unspecified diastolic (congestive) heart failure: Secondary | ICD-10-CM | POA: Diagnosis not present

## 2021-02-10 DIAGNOSIS — K219 Gastro-esophageal reflux disease without esophagitis: Secondary | ICD-10-CM | POA: Diagnosis not present

## 2021-02-10 DIAGNOSIS — E7800 Pure hypercholesterolemia, unspecified: Secondary | ICD-10-CM | POA: Diagnosis not present

## 2021-02-10 DIAGNOSIS — G9341 Metabolic encephalopathy: Secondary | ICD-10-CM | POA: Diagnosis not present

## 2021-02-11 ENCOUNTER — Ambulatory Visit (HOSPITAL_BASED_OUTPATIENT_CLINIC_OR_DEPARTMENT_OTHER): Payer: Medicare Other | Admitting: Family

## 2021-02-11 DIAGNOSIS — I5030 Unspecified diastolic (congestive) heart failure: Secondary | ICD-10-CM | POA: Diagnosis not present

## 2021-02-11 DIAGNOSIS — E1140 Type 2 diabetes mellitus with diabetic neuropathy, unspecified: Secondary | ICD-10-CM | POA: Diagnosis not present

## 2021-02-11 DIAGNOSIS — E7800 Pure hypercholesterolemia, unspecified: Secondary | ICD-10-CM | POA: Diagnosis not present

## 2021-02-11 DIAGNOSIS — Z951 Presence of aortocoronary bypass graft: Secondary | ICD-10-CM | POA: Diagnosis not present

## 2021-02-11 DIAGNOSIS — K219 Gastro-esophageal reflux disease without esophagitis: Secondary | ICD-10-CM | POA: Diagnosis not present

## 2021-02-11 DIAGNOSIS — G9341 Metabolic encephalopathy: Secondary | ICD-10-CM | POA: Diagnosis not present

## 2021-02-12 DIAGNOSIS — E1140 Type 2 diabetes mellitus with diabetic neuropathy, unspecified: Secondary | ICD-10-CM | POA: Diagnosis not present

## 2021-02-12 DIAGNOSIS — E7800 Pure hypercholesterolemia, unspecified: Secondary | ICD-10-CM | POA: Diagnosis not present

## 2021-02-12 DIAGNOSIS — G9341 Metabolic encephalopathy: Secondary | ICD-10-CM | POA: Diagnosis not present

## 2021-02-12 DIAGNOSIS — I5030 Unspecified diastolic (congestive) heart failure: Secondary | ICD-10-CM | POA: Diagnosis not present

## 2021-02-12 DIAGNOSIS — K219 Gastro-esophageal reflux disease without esophagitis: Secondary | ICD-10-CM | POA: Diagnosis not present

## 2021-02-12 DIAGNOSIS — Z951 Presence of aortocoronary bypass graft: Secondary | ICD-10-CM | POA: Diagnosis not present

## 2021-02-15 DIAGNOSIS — G9341 Metabolic encephalopathy: Secondary | ICD-10-CM | POA: Diagnosis not present

## 2021-02-15 DIAGNOSIS — E7800 Pure hypercholesterolemia, unspecified: Secondary | ICD-10-CM | POA: Diagnosis not present

## 2021-02-15 DIAGNOSIS — K219 Gastro-esophageal reflux disease without esophagitis: Secondary | ICD-10-CM | POA: Diagnosis not present

## 2021-02-15 DIAGNOSIS — Z951 Presence of aortocoronary bypass graft: Secondary | ICD-10-CM | POA: Diagnosis not present

## 2021-02-15 DIAGNOSIS — E1140 Type 2 diabetes mellitus with diabetic neuropathy, unspecified: Secondary | ICD-10-CM | POA: Diagnosis not present

## 2021-02-15 DIAGNOSIS — I5030 Unspecified diastolic (congestive) heart failure: Secondary | ICD-10-CM | POA: Diagnosis not present

## 2021-02-16 ENCOUNTER — Ambulatory Visit: Payer: Medicare Other

## 2021-02-16 DIAGNOSIS — I5030 Unspecified diastolic (congestive) heart failure: Secondary | ICD-10-CM | POA: Diagnosis not present

## 2021-02-16 DIAGNOSIS — E1140 Type 2 diabetes mellitus with diabetic neuropathy, unspecified: Secondary | ICD-10-CM | POA: Diagnosis not present

## 2021-02-16 DIAGNOSIS — K219 Gastro-esophageal reflux disease without esophagitis: Secondary | ICD-10-CM | POA: Diagnosis not present

## 2021-02-16 DIAGNOSIS — Z951 Presence of aortocoronary bypass graft: Secondary | ICD-10-CM | POA: Diagnosis not present

## 2021-02-16 DIAGNOSIS — G9341 Metabolic encephalopathy: Secondary | ICD-10-CM | POA: Diagnosis not present

## 2021-02-16 DIAGNOSIS — E7800 Pure hypercholesterolemia, unspecified: Secondary | ICD-10-CM | POA: Diagnosis not present

## 2021-02-17 DIAGNOSIS — E1140 Type 2 diabetes mellitus with diabetic neuropathy, unspecified: Secondary | ICD-10-CM | POA: Diagnosis not present

## 2021-02-17 DIAGNOSIS — J9611 Chronic respiratory failure with hypoxia: Secondary | ICD-10-CM | POA: Diagnosis not present

## 2021-02-17 DIAGNOSIS — I482 Chronic atrial fibrillation, unspecified: Secondary | ICD-10-CM | POA: Diagnosis not present

## 2021-02-17 DIAGNOSIS — Z789 Other specified health status: Secondary | ICD-10-CM | POA: Diagnosis not present

## 2021-02-17 DIAGNOSIS — E7800 Pure hypercholesterolemia, unspecified: Secondary | ICD-10-CM | POA: Diagnosis not present

## 2021-02-17 DIAGNOSIS — Z0000 Encounter for general adult medical examination without abnormal findings: Secondary | ICD-10-CM | POA: Diagnosis not present

## 2021-02-17 DIAGNOSIS — I5030 Unspecified diastolic (congestive) heart failure: Secondary | ICD-10-CM | POA: Diagnosis not present

## 2021-02-17 DIAGNOSIS — G9341 Metabolic encephalopathy: Secondary | ICD-10-CM | POA: Diagnosis not present

## 2021-02-17 DIAGNOSIS — E1151 Type 2 diabetes mellitus with diabetic peripheral angiopathy without gangrene: Secondary | ICD-10-CM | POA: Diagnosis not present

## 2021-02-17 DIAGNOSIS — Z951 Presence of aortocoronary bypass graft: Secondary | ICD-10-CM | POA: Diagnosis not present

## 2021-02-17 DIAGNOSIS — Z515 Encounter for palliative care: Secondary | ICD-10-CM | POA: Diagnosis not present

## 2021-02-17 DIAGNOSIS — K219 Gastro-esophageal reflux disease without esophagitis: Secondary | ICD-10-CM | POA: Diagnosis not present

## 2021-02-18 DIAGNOSIS — Z951 Presence of aortocoronary bypass graft: Secondary | ICD-10-CM | POA: Diagnosis not present

## 2021-02-18 DIAGNOSIS — G9341 Metabolic encephalopathy: Secondary | ICD-10-CM | POA: Diagnosis not present

## 2021-02-18 DIAGNOSIS — E1140 Type 2 diabetes mellitus with diabetic neuropathy, unspecified: Secondary | ICD-10-CM | POA: Diagnosis not present

## 2021-02-18 DIAGNOSIS — K219 Gastro-esophageal reflux disease without esophagitis: Secondary | ICD-10-CM | POA: Diagnosis not present

## 2021-02-18 DIAGNOSIS — I5030 Unspecified diastolic (congestive) heart failure: Secondary | ICD-10-CM | POA: Diagnosis not present

## 2021-02-18 DIAGNOSIS — E7800 Pure hypercholesterolemia, unspecified: Secondary | ICD-10-CM | POA: Diagnosis not present

## 2021-02-19 DIAGNOSIS — G9341 Metabolic encephalopathy: Secondary | ICD-10-CM | POA: Diagnosis not present

## 2021-02-19 DIAGNOSIS — K219 Gastro-esophageal reflux disease without esophagitis: Secondary | ICD-10-CM | POA: Diagnosis not present

## 2021-02-19 DIAGNOSIS — E1140 Type 2 diabetes mellitus with diabetic neuropathy, unspecified: Secondary | ICD-10-CM | POA: Diagnosis not present

## 2021-02-19 DIAGNOSIS — I5030 Unspecified diastolic (congestive) heart failure: Secondary | ICD-10-CM | POA: Diagnosis not present

## 2021-02-19 DIAGNOSIS — Z951 Presence of aortocoronary bypass graft: Secondary | ICD-10-CM | POA: Diagnosis not present

## 2021-02-19 DIAGNOSIS — E7800 Pure hypercholesterolemia, unspecified: Secondary | ICD-10-CM | POA: Diagnosis not present

## 2021-02-22 DIAGNOSIS — Z951 Presence of aortocoronary bypass graft: Secondary | ICD-10-CM | POA: Diagnosis not present

## 2021-02-22 DIAGNOSIS — K219 Gastro-esophageal reflux disease without esophagitis: Secondary | ICD-10-CM | POA: Diagnosis not present

## 2021-02-22 DIAGNOSIS — E1140 Type 2 diabetes mellitus with diabetic neuropathy, unspecified: Secondary | ICD-10-CM | POA: Diagnosis not present

## 2021-02-22 DIAGNOSIS — G9341 Metabolic encephalopathy: Secondary | ICD-10-CM | POA: Diagnosis not present

## 2021-02-22 DIAGNOSIS — E7800 Pure hypercholesterolemia, unspecified: Secondary | ICD-10-CM | POA: Diagnosis not present

## 2021-02-22 DIAGNOSIS — I5030 Unspecified diastolic (congestive) heart failure: Secondary | ICD-10-CM | POA: Diagnosis not present

## 2021-02-23 DIAGNOSIS — E1140 Type 2 diabetes mellitus with diabetic neuropathy, unspecified: Secondary | ICD-10-CM | POA: Diagnosis not present

## 2021-02-23 DIAGNOSIS — G9341 Metabolic encephalopathy: Secondary | ICD-10-CM | POA: Diagnosis not present

## 2021-02-23 DIAGNOSIS — K219 Gastro-esophageal reflux disease without esophagitis: Secondary | ICD-10-CM | POA: Diagnosis not present

## 2021-02-23 DIAGNOSIS — I5030 Unspecified diastolic (congestive) heart failure: Secondary | ICD-10-CM | POA: Diagnosis not present

## 2021-02-23 DIAGNOSIS — F419 Anxiety disorder, unspecified: Secondary | ICD-10-CM | POA: Diagnosis not present

## 2021-02-23 DIAGNOSIS — E7800 Pure hypercholesterolemia, unspecified: Secondary | ICD-10-CM | POA: Diagnosis not present

## 2021-02-23 DIAGNOSIS — Z951 Presence of aortocoronary bypass graft: Secondary | ICD-10-CM | POA: Diagnosis not present

## 2021-02-24 DIAGNOSIS — E7800 Pure hypercholesterolemia, unspecified: Secondary | ICD-10-CM | POA: Diagnosis not present

## 2021-02-24 DIAGNOSIS — Z951 Presence of aortocoronary bypass graft: Secondary | ICD-10-CM | POA: Diagnosis not present

## 2021-02-24 DIAGNOSIS — K219 Gastro-esophageal reflux disease without esophagitis: Secondary | ICD-10-CM | POA: Diagnosis not present

## 2021-02-24 DIAGNOSIS — G9341 Metabolic encephalopathy: Secondary | ICD-10-CM | POA: Diagnosis not present

## 2021-02-24 DIAGNOSIS — I5030 Unspecified diastolic (congestive) heart failure: Secondary | ICD-10-CM | POA: Diagnosis not present

## 2021-02-24 DIAGNOSIS — E1140 Type 2 diabetes mellitus with diabetic neuropathy, unspecified: Secondary | ICD-10-CM | POA: Diagnosis not present

## 2021-02-25 DIAGNOSIS — E1140 Type 2 diabetes mellitus with diabetic neuropathy, unspecified: Secondary | ICD-10-CM | POA: Diagnosis not present

## 2021-02-25 DIAGNOSIS — E7800 Pure hypercholesterolemia, unspecified: Secondary | ICD-10-CM | POA: Diagnosis not present

## 2021-02-25 DIAGNOSIS — G9341 Metabolic encephalopathy: Secondary | ICD-10-CM | POA: Diagnosis not present

## 2021-02-25 DIAGNOSIS — Z951 Presence of aortocoronary bypass graft: Secondary | ICD-10-CM | POA: Diagnosis not present

## 2021-02-25 DIAGNOSIS — I5030 Unspecified diastolic (congestive) heart failure: Secondary | ICD-10-CM | POA: Diagnosis not present

## 2021-02-25 DIAGNOSIS — K219 Gastro-esophageal reflux disease without esophagitis: Secondary | ICD-10-CM | POA: Diagnosis not present

## 2021-02-26 DIAGNOSIS — E7800 Pure hypercholesterolemia, unspecified: Secondary | ICD-10-CM | POA: Diagnosis not present

## 2021-02-26 DIAGNOSIS — I5030 Unspecified diastolic (congestive) heart failure: Secondary | ICD-10-CM | POA: Diagnosis not present

## 2021-02-26 DIAGNOSIS — Z951 Presence of aortocoronary bypass graft: Secondary | ICD-10-CM | POA: Diagnosis not present

## 2021-02-26 DIAGNOSIS — E1140 Type 2 diabetes mellitus with diabetic neuropathy, unspecified: Secondary | ICD-10-CM | POA: Diagnosis not present

## 2021-02-26 DIAGNOSIS — K219 Gastro-esophageal reflux disease without esophagitis: Secondary | ICD-10-CM | POA: Diagnosis not present

## 2021-02-26 DIAGNOSIS — G9341 Metabolic encephalopathy: Secondary | ICD-10-CM | POA: Diagnosis not present

## 2021-03-01 DIAGNOSIS — E7800 Pure hypercholesterolemia, unspecified: Secondary | ICD-10-CM | POA: Diagnosis not present

## 2021-03-01 DIAGNOSIS — Z951 Presence of aortocoronary bypass graft: Secondary | ICD-10-CM | POA: Diagnosis not present

## 2021-03-01 DIAGNOSIS — G9341 Metabolic encephalopathy: Secondary | ICD-10-CM | POA: Diagnosis not present

## 2021-03-01 DIAGNOSIS — I5030 Unspecified diastolic (congestive) heart failure: Secondary | ICD-10-CM | POA: Diagnosis not present

## 2021-03-01 DIAGNOSIS — E1140 Type 2 diabetes mellitus with diabetic neuropathy, unspecified: Secondary | ICD-10-CM | POA: Diagnosis not present

## 2021-03-01 DIAGNOSIS — K219 Gastro-esophageal reflux disease without esophagitis: Secondary | ICD-10-CM | POA: Diagnosis not present

## 2021-03-02 DIAGNOSIS — Z951 Presence of aortocoronary bypass graft: Secondary | ICD-10-CM | POA: Diagnosis not present

## 2021-03-02 DIAGNOSIS — E1140 Type 2 diabetes mellitus with diabetic neuropathy, unspecified: Secondary | ICD-10-CM | POA: Diagnosis not present

## 2021-03-02 DIAGNOSIS — K219 Gastro-esophageal reflux disease without esophagitis: Secondary | ICD-10-CM | POA: Diagnosis not present

## 2021-03-02 DIAGNOSIS — G9341 Metabolic encephalopathy: Secondary | ICD-10-CM | POA: Diagnosis not present

## 2021-03-02 DIAGNOSIS — E7800 Pure hypercholesterolemia, unspecified: Secondary | ICD-10-CM | POA: Diagnosis not present

## 2021-03-02 DIAGNOSIS — I5030 Unspecified diastolic (congestive) heart failure: Secondary | ICD-10-CM | POA: Diagnosis not present

## 2021-03-03 DIAGNOSIS — E1140 Type 2 diabetes mellitus with diabetic neuropathy, unspecified: Secondary | ICD-10-CM | POA: Diagnosis not present

## 2021-03-03 DIAGNOSIS — I5030 Unspecified diastolic (congestive) heart failure: Secondary | ICD-10-CM | POA: Diagnosis not present

## 2021-03-03 DIAGNOSIS — E7800 Pure hypercholesterolemia, unspecified: Secondary | ICD-10-CM | POA: Diagnosis not present

## 2021-03-03 DIAGNOSIS — Z951 Presence of aortocoronary bypass graft: Secondary | ICD-10-CM | POA: Diagnosis not present

## 2021-03-03 DIAGNOSIS — K219 Gastro-esophageal reflux disease without esophagitis: Secondary | ICD-10-CM | POA: Diagnosis not present

## 2021-03-03 DIAGNOSIS — G9341 Metabolic encephalopathy: Secondary | ICD-10-CM | POA: Diagnosis not present

## 2021-03-04 DIAGNOSIS — Z7901 Long term (current) use of anticoagulants: Secondary | ICD-10-CM | POA: Diagnosis not present

## 2021-03-04 DIAGNOSIS — I959 Hypotension, unspecified: Secondary | ICD-10-CM | POA: Diagnosis not present

## 2021-03-04 DIAGNOSIS — N390 Urinary tract infection, site not specified: Secondary | ICD-10-CM | POA: Diagnosis not present

## 2021-03-04 DIAGNOSIS — I5030 Unspecified diastolic (congestive) heart failure: Secondary | ICD-10-CM | POA: Diagnosis not present

## 2021-03-04 DIAGNOSIS — E876 Hypokalemia: Secondary | ICD-10-CM | POA: Diagnosis not present

## 2021-03-04 DIAGNOSIS — I7025 Atherosclerosis of native arteries of other extremities with ulceration: Secondary | ICD-10-CM | POA: Diagnosis not present

## 2021-03-04 DIAGNOSIS — E1140 Type 2 diabetes mellitus with diabetic neuropathy, unspecified: Secondary | ICD-10-CM | POA: Diagnosis not present

## 2021-03-04 DIAGNOSIS — E1122 Type 2 diabetes mellitus with diabetic chronic kidney disease: Secondary | ICD-10-CM | POA: Diagnosis not present

## 2021-03-04 DIAGNOSIS — Z9981 Dependence on supplemental oxygen: Secondary | ICD-10-CM | POA: Diagnosis not present

## 2021-03-04 DIAGNOSIS — K219 Gastro-esophageal reflux disease without esophagitis: Secondary | ICD-10-CM | POA: Diagnosis not present

## 2021-03-04 DIAGNOSIS — I130 Hypertensive heart and chronic kidney disease with heart failure and stage 1 through stage 4 chronic kidney disease, or unspecified chronic kidney disease: Secondary | ICD-10-CM | POA: Diagnosis not present

## 2021-03-04 DIAGNOSIS — Z8673 Personal history of transient ischemic attack (TIA), and cerebral infarction without residual deficits: Secondary | ICD-10-CM | POA: Diagnosis not present

## 2021-03-04 DIAGNOSIS — Z951 Presence of aortocoronary bypass graft: Secondary | ICD-10-CM | POA: Diagnosis not present

## 2021-03-04 DIAGNOSIS — M4800 Spinal stenosis, site unspecified: Secondary | ICD-10-CM | POA: Diagnosis not present

## 2021-03-04 DIAGNOSIS — I480 Paroxysmal atrial fibrillation: Secondary | ICD-10-CM | POA: Diagnosis not present

## 2021-03-04 DIAGNOSIS — G9341 Metabolic encephalopathy: Secondary | ICD-10-CM | POA: Diagnosis not present

## 2021-03-04 DIAGNOSIS — R011 Cardiac murmur, unspecified: Secondary | ICD-10-CM | POA: Diagnosis not present

## 2021-03-04 DIAGNOSIS — Z7984 Long term (current) use of oral hypoglycemic drugs: Secondary | ICD-10-CM | POA: Diagnosis not present

## 2021-03-04 DIAGNOSIS — I872 Venous insufficiency (chronic) (peripheral): Secondary | ICD-10-CM | POA: Diagnosis not present

## 2021-03-04 DIAGNOSIS — R1312 Dysphagia, oropharyngeal phase: Secondary | ICD-10-CM | POA: Diagnosis not present

## 2021-03-04 DIAGNOSIS — J309 Allergic rhinitis, unspecified: Secondary | ICD-10-CM | POA: Diagnosis not present

## 2021-03-04 DIAGNOSIS — E7800 Pure hypercholesterolemia, unspecified: Secondary | ICD-10-CM | POA: Diagnosis not present

## 2021-03-04 DIAGNOSIS — J9611 Chronic respiratory failure with hypoxia: Secondary | ICD-10-CM | POA: Diagnosis not present

## 2021-03-04 DIAGNOSIS — N183 Chronic kidney disease, stage 3 unspecified: Secondary | ICD-10-CM | POA: Diagnosis not present

## 2021-03-04 DIAGNOSIS — E1151 Type 2 diabetes mellitus with diabetic peripheral angiopathy without gangrene: Secondary | ICD-10-CM | POA: Diagnosis not present

## 2021-03-05 DIAGNOSIS — Z7984 Long term (current) use of oral hypoglycemic drugs: Secondary | ICD-10-CM | POA: Diagnosis not present

## 2021-03-05 DIAGNOSIS — I5030 Unspecified diastolic (congestive) heart failure: Secondary | ICD-10-CM | POA: Diagnosis not present

## 2021-03-05 DIAGNOSIS — E1140 Type 2 diabetes mellitus with diabetic neuropathy, unspecified: Secondary | ICD-10-CM | POA: Diagnosis not present

## 2021-03-05 DIAGNOSIS — J309 Allergic rhinitis, unspecified: Secondary | ICD-10-CM | POA: Diagnosis not present

## 2021-03-05 DIAGNOSIS — N390 Urinary tract infection, site not specified: Secondary | ICD-10-CM | POA: Diagnosis not present

## 2021-03-05 DIAGNOSIS — Z9981 Dependence on supplemental oxygen: Secondary | ICD-10-CM | POA: Diagnosis not present

## 2021-03-07 DIAGNOSIS — Z7984 Long term (current) use of oral hypoglycemic drugs: Secondary | ICD-10-CM | POA: Diagnosis not present

## 2021-03-07 DIAGNOSIS — I5030 Unspecified diastolic (congestive) heart failure: Secondary | ICD-10-CM | POA: Diagnosis not present

## 2021-03-07 DIAGNOSIS — J309 Allergic rhinitis, unspecified: Secondary | ICD-10-CM | POA: Diagnosis not present

## 2021-03-07 DIAGNOSIS — E1140 Type 2 diabetes mellitus with diabetic neuropathy, unspecified: Secondary | ICD-10-CM | POA: Diagnosis not present

## 2021-03-07 DIAGNOSIS — Z9981 Dependence on supplemental oxygen: Secondary | ICD-10-CM | POA: Diagnosis not present

## 2021-03-07 DIAGNOSIS — N390 Urinary tract infection, site not specified: Secondary | ICD-10-CM | POA: Diagnosis not present

## 2021-03-08 ENCOUNTER — Encounter: Payer: Self-pay | Admitting: Cardiovascular Disease

## 2021-03-09 ENCOUNTER — Ambulatory Visit (HOSPITAL_BASED_OUTPATIENT_CLINIC_OR_DEPARTMENT_OTHER): Payer: BLUE CROSS/BLUE SHIELD | Admitting: Family

## 2021-04-04 DEATH — deceased

## 2021-12-08 ENCOUNTER — Encounter: Payer: Medicare Other | Admitting: Dermatology

## 2022-07-25 IMAGING — DX DG PELVIS 1-2V
1 series · 1 of 1 positions shown · non-contrast
Comparison: None.

CLINICAL DATA: Fall, RIGHT hip pain.

EXAM:
PELVIS - 1-2 VIEW

[pelvis ap]
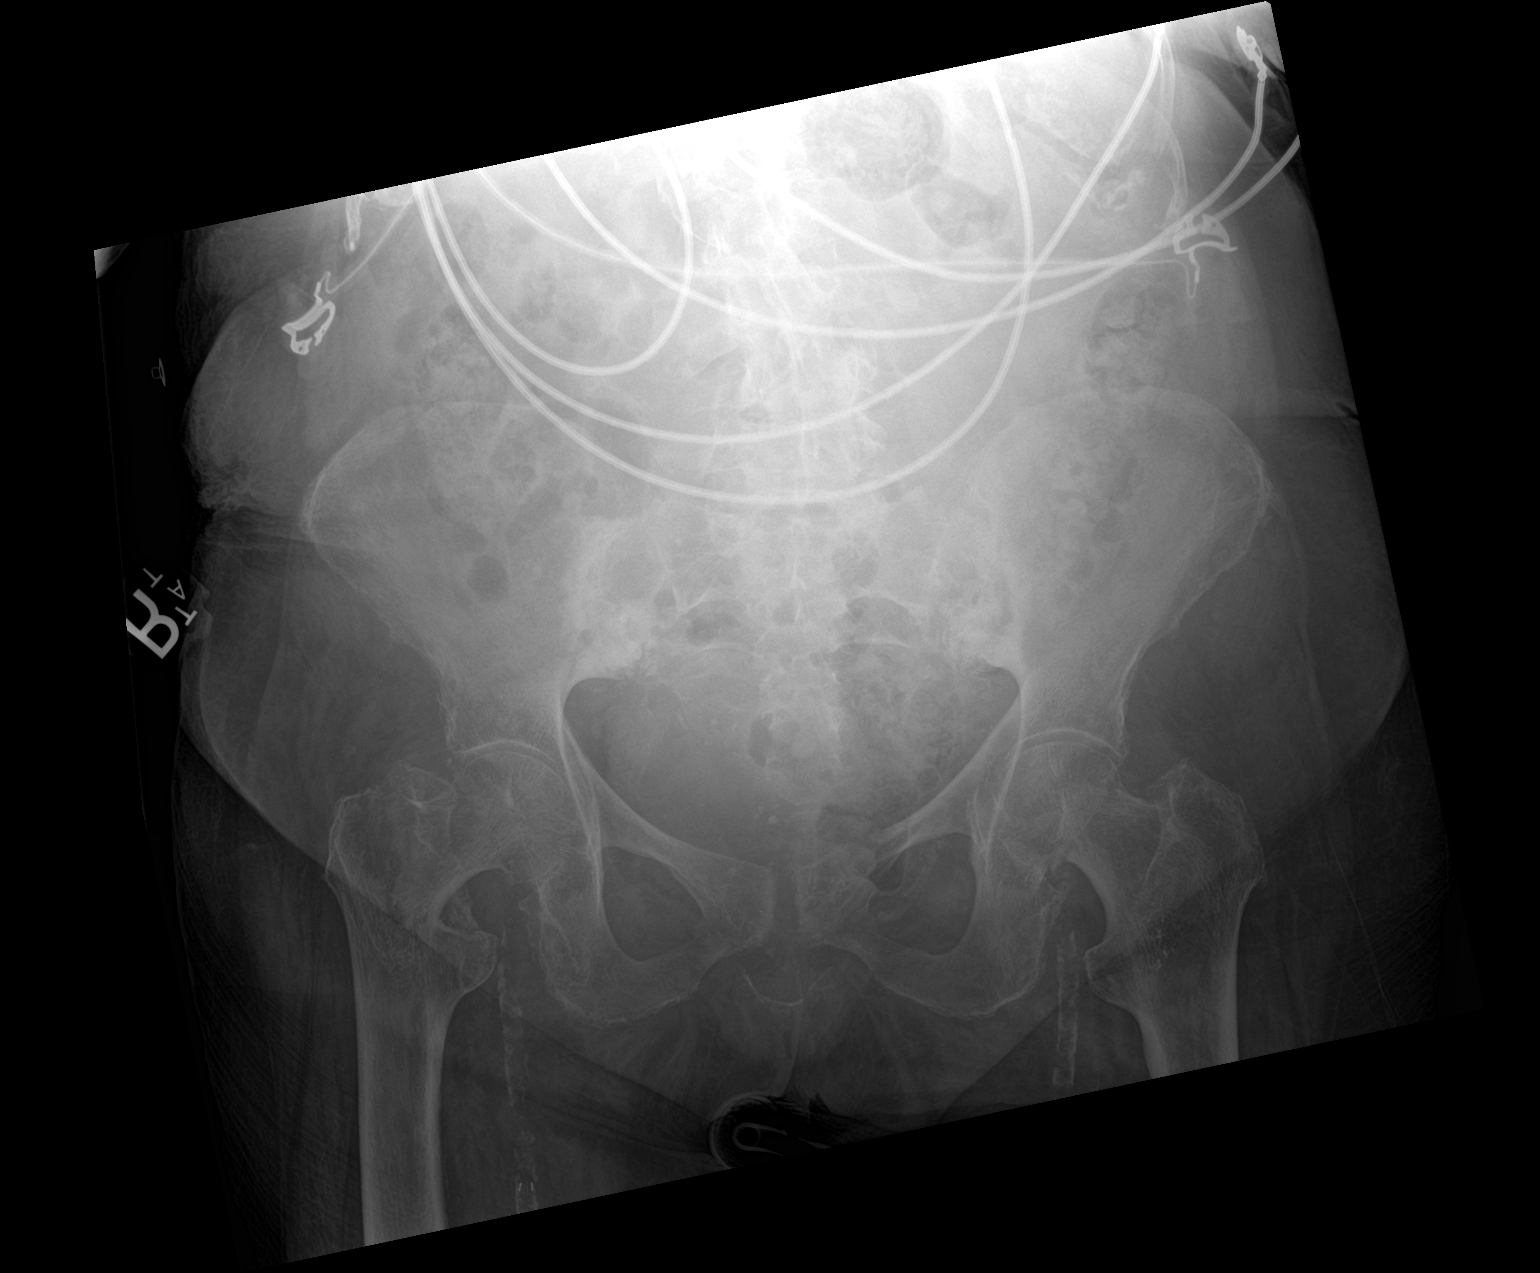

[1 of 1 positions shown; findings below may reference images not displayed]

FINDINGS: Displaced fracture of the RIGHT femoral neck, possibly comminuted,
with associated angulation deformity.

Questionable fracture within the LEFT superior pubic ramus, but this
is more likely artifactual related to patient positioning and
overlying bowel loop.

Remainder of the osseous structures of the pelvis appear intact and
normally aligned. Extensive vascular calcifications.
IMPRESSION: 1. Displaced fracture of the RIGHT femoral neck, possibly
comminuted, with associated angulation deformity.
2. Questionable fracture within the LEFT superior pubic ramus, but
this is more likely artifactual related to patient positioning and
overlying bowel loop. Consider CT of the pelvis for further
characterization.
